# Patient Record
Sex: Female | Born: 1945 | Race: White | Hispanic: No | Smoking: Former smoker
Health system: Southern US, Community
[De-identification: ages and names within clinical notes are randomized; demographics above are authoritative.]

## PROBLEM LIST (undated history)

## (undated) DIAGNOSIS — K859 Acute pancreatitis without necrosis or infection, unspecified: Secondary | ICD-10-CM

## (undated) DIAGNOSIS — K219 Gastro-esophageal reflux disease without esophagitis: Secondary | ICD-10-CM

## (undated) DIAGNOSIS — K8010 Calculus of gallbladder with chronic cholecystitis without obstruction: Secondary | ICD-10-CM

## (undated) DIAGNOSIS — I099 Rheumatic heart disease, unspecified: Secondary | ICD-10-CM

## (undated) DIAGNOSIS — Z923 Personal history of irradiation: Secondary | ICD-10-CM

## (undated) DIAGNOSIS — C801 Malignant (primary) neoplasm, unspecified: Secondary | ICD-10-CM

## (undated) DIAGNOSIS — E785 Hyperlipidemia, unspecified: Secondary | ICD-10-CM

## (undated) DIAGNOSIS — I350 Nonrheumatic aortic (valve) stenosis: Secondary | ICD-10-CM

## (undated) DIAGNOSIS — E669 Obesity, unspecified: Secondary | ICD-10-CM

## (undated) DIAGNOSIS — H269 Unspecified cataract: Secondary | ICD-10-CM

## (undated) DIAGNOSIS — G4730 Sleep apnea, unspecified: Secondary | ICD-10-CM

## (undated) DIAGNOSIS — R195 Other fecal abnormalities: Secondary | ICD-10-CM

## (undated) DIAGNOSIS — C20 Malignant neoplasm of rectum: Secondary | ICD-10-CM

## (undated) DIAGNOSIS — Z006 Encounter for examination for normal comparison and control in clinical research program: Secondary | ICD-10-CM

## (undated) DIAGNOSIS — IMO0002 Reserved for concepts with insufficient information to code with codable children: Secondary | ICD-10-CM

## (undated) DIAGNOSIS — F32A Depression, unspecified: Secondary | ICD-10-CM

## (undated) DIAGNOSIS — J189 Pneumonia, unspecified organism: Secondary | ICD-10-CM

## (undated) DIAGNOSIS — Z860100 Personal history of colon polyps, unspecified: Secondary | ICD-10-CM

## (undated) DIAGNOSIS — E538 Deficiency of other specified B group vitamins: Secondary | ICD-10-CM

## (undated) DIAGNOSIS — Z950 Presence of cardiac pacemaker: Secondary | ICD-10-CM

## (undated) DIAGNOSIS — Z8701 Personal history of pneumonia (recurrent): Secondary | ICD-10-CM

## (undated) DIAGNOSIS — H548 Legal blindness, as defined in USA: Secondary | ICD-10-CM

## (undated) DIAGNOSIS — R569 Unspecified convulsions: Secondary | ICD-10-CM

## (undated) DIAGNOSIS — I872 Venous insufficiency (chronic) (peripheral): Secondary | ICD-10-CM

## (undated) DIAGNOSIS — J449 Chronic obstructive pulmonary disease, unspecified: Secondary | ICD-10-CM

## (undated) DIAGNOSIS — D649 Anemia, unspecified: Secondary | ICD-10-CM

## (undated) DIAGNOSIS — R600 Localized edema: Secondary | ICD-10-CM

## (undated) DIAGNOSIS — N3000 Acute cystitis without hematuria: Secondary | ICD-10-CM

## (undated) DIAGNOSIS — M1990 Unspecified osteoarthritis, unspecified site: Secondary | ICD-10-CM

## (undated) DIAGNOSIS — Z8619 Personal history of other infectious and parasitic diseases: Secondary | ICD-10-CM

## (undated) DIAGNOSIS — Z87442 Personal history of urinary calculi: Secondary | ICD-10-CM

## (undated) DIAGNOSIS — K5900 Constipation, unspecified: Secondary | ICD-10-CM

## (undated) DIAGNOSIS — T07XXXA Unspecified multiple injuries, initial encounter: Secondary | ICD-10-CM

## (undated) DIAGNOSIS — R112 Nausea with vomiting, unspecified: Secondary | ICD-10-CM

## (undated) DIAGNOSIS — N390 Urinary tract infection, site not specified: Secondary | ICD-10-CM

## (undated) DIAGNOSIS — K56600 Partial intestinal obstruction, unspecified as to cause: Secondary | ICD-10-CM

## (undated) DIAGNOSIS — M8580 Other specified disorders of bone density and structure, unspecified site: Secondary | ICD-10-CM

## (undated) DIAGNOSIS — Z9889 Other specified postprocedural states: Secondary | ICD-10-CM

## (undated) DIAGNOSIS — I6381 Other cerebral infarction due to occlusion or stenosis of small artery: Secondary | ICD-10-CM

## (undated) DIAGNOSIS — R011 Cardiac murmur, unspecified: Secondary | ICD-10-CM

## (undated) DIAGNOSIS — R911 Solitary pulmonary nodule: Secondary | ICD-10-CM

## (undated) DIAGNOSIS — Z952 Presence of prosthetic heart valve: Secondary | ICD-10-CM

## (undated) DIAGNOSIS — F5104 Psychophysiologic insomnia: Secondary | ICD-10-CM

## (undated) DIAGNOSIS — I10 Essential (primary) hypertension: Secondary | ICD-10-CM

## (undated) DIAGNOSIS — A419 Sepsis, unspecified organism: Secondary | ICD-10-CM

## (undated) DIAGNOSIS — I5032 Chronic diastolic (congestive) heart failure: Secondary | ICD-10-CM

## (undated) DIAGNOSIS — R0600 Dyspnea, unspecified: Secondary | ICD-10-CM

## (undated) HISTORY — DX: Acute pancreatitis without necrosis or infection, unspecified: K85.90

## (undated) HISTORY — DX: Nausea with vomiting, unspecified: R11.2

## (undated) HISTORY — DX: Psychophysiologic insomnia: F51.04

## (undated) HISTORY — DX: Reserved for concepts with insufficient information to code with codable children: IMO0002

## (undated) HISTORY — DX: Other cerebral infarction due to occlusion or stenosis of small artery: I63.81

## (undated) HISTORY — DX: Legal blindness, as defined in USA: H54.8

## (undated) HISTORY — PX: BACK SURGERY: SHX140

## (undated) HISTORY — DX: Solitary pulmonary nodule: R91.1

## (undated) HISTORY — DX: Nonrheumatic aortic (valve) stenosis: I35.0

## (undated) HISTORY — DX: Personal history of colonic polyps: Z86.010

## (undated) HISTORY — DX: Anemia, unspecified: D64.9

## (undated) HISTORY — DX: Acute cystitis without hematuria: N30.00

## (undated) HISTORY — DX: Urinary tract infection, site not specified: N39.0

## (undated) HISTORY — PX: TONSILLECTOMY: SUR1361

## (undated) HISTORY — DX: Personal history of other infectious and parasitic diseases: Z86.19

## (undated) HISTORY — DX: Hyperlipidemia, unspecified: E78.5

## (undated) HISTORY — DX: Chronic obstructive pulmonary disease, unspecified: J44.9

## (undated) HISTORY — DX: Other specified postprocedural states: Z98.890

## (undated) HISTORY — PX: HAND SURGERY: SHX662

## (undated) HISTORY — PX: CARPAL TUNNEL RELEASE: SHX101

## (undated) HISTORY — DX: Malignant (primary) neoplasm, unspecified: C80.1

## (undated) HISTORY — DX: Personal history of irradiation: Z92.3

## (undated) HISTORY — DX: Personal history of pneumonia (recurrent): Z87.01

## (undated) HISTORY — DX: Presence of prosthetic heart valve: Z95.2

## (undated) HISTORY — PX: FOOT SURGERY: SHX648

## (undated) HISTORY — DX: Encounter for examination for normal comparison and control in clinical research program: Z00.6

## (undated) HISTORY — DX: Unspecified osteoarthritis, unspecified site: M19.90

## (undated) HISTORY — DX: Sleep apnea, unspecified: G47.30

## (undated) HISTORY — DX: Other specified disorders of bone density and structure, unspecified site: M85.80

## (undated) HISTORY — DX: Essential (primary) hypertension: I10

## (undated) HISTORY — DX: Chronic diastolic (congestive) heart failure: I50.32

## (undated) HISTORY — DX: Personal history of colon polyps, unspecified: Z86.0100

## (undated) HISTORY — DX: Pneumonia, unspecified organism: J18.9

## (undated) HISTORY — DX: Malignant neoplasm of rectum: C20

## (undated) HISTORY — DX: Sepsis, unspecified organism: A41.9

## (undated) HISTORY — DX: Deficiency of other specified B group vitamins: E53.8

## (undated) HISTORY — DX: Cardiac murmur, unspecified: R01.1

## (undated) HISTORY — DX: Obesity, unspecified: E66.9

---
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
## 1998-01-05 ENCOUNTER — Ambulatory Visit (HOSPITAL_COMMUNITY): Admission: RE | Admit: 1998-01-05 | Discharge: 1998-01-05 | Payer: Self-pay | Admitting: Obstetrics and Gynecology

## 1998-01-22 ENCOUNTER — Encounter: Payer: Self-pay | Admitting: Internal Medicine

## 1998-01-22 LAB — CONVERTED CEMR LAB

## 1999-02-18 ENCOUNTER — Encounter: Payer: Self-pay | Admitting: Obstetrics and Gynecology

## 1999-02-18 ENCOUNTER — Ambulatory Visit (HOSPITAL_COMMUNITY): Admission: RE | Admit: 1999-02-18 | Discharge: 1999-02-18 | Payer: Self-pay | Admitting: Obstetrics and Gynecology

## 1999-10-02 ENCOUNTER — Inpatient Hospital Stay (HOSPITAL_COMMUNITY): Admission: EM | Admit: 1999-10-02 | Discharge: 1999-10-07 | Payer: Self-pay | Admitting: Emergency Medicine

## 1999-10-03 ENCOUNTER — Encounter: Payer: Self-pay | Admitting: *Deleted

## 2000-05-01 ENCOUNTER — Ambulatory Visit (HOSPITAL_COMMUNITY): Admission: RE | Admit: 2000-05-01 | Discharge: 2000-05-01 | Payer: Self-pay | Admitting: Obstetrics and Gynecology

## 2000-05-01 ENCOUNTER — Encounter: Payer: Self-pay | Admitting: Obstetrics and Gynecology

## 2000-09-12 ENCOUNTER — Encounter: Payer: Self-pay | Admitting: *Deleted

## 2000-09-12 ENCOUNTER — Other Ambulatory Visit: Admission: RE | Admit: 2000-09-12 | Discharge: 2000-09-12 | Payer: Self-pay | Admitting: Obstetrics and Gynecology

## 2000-09-12 ENCOUNTER — Encounter: Admission: RE | Admit: 2000-09-12 | Discharge: 2000-09-12 | Payer: Self-pay | Admitting: *Deleted

## 2000-09-13 ENCOUNTER — Ambulatory Visit (HOSPITAL_BASED_OUTPATIENT_CLINIC_OR_DEPARTMENT_OTHER): Admission: RE | Admit: 2000-09-13 | Discharge: 2000-09-13 | Payer: Self-pay | Admitting: *Deleted

## 2000-12-28 ENCOUNTER — Other Ambulatory Visit: Admission: RE | Admit: 2000-12-28 | Discharge: 2000-12-28 | Payer: Self-pay | Admitting: Obstetrics and Gynecology

## 2000-12-28 ENCOUNTER — Encounter (INDEPENDENT_AMBULATORY_CARE_PROVIDER_SITE_OTHER): Payer: Self-pay

## 2001-05-14 ENCOUNTER — Ambulatory Visit (HOSPITAL_COMMUNITY): Admission: RE | Admit: 2001-05-14 | Discharge: 2001-05-14 | Payer: Self-pay | Admitting: Obstetrics and Gynecology

## 2001-05-14 ENCOUNTER — Encounter: Payer: Self-pay | Admitting: Obstetrics and Gynecology

## 2001-09-11 ENCOUNTER — Other Ambulatory Visit: Admission: RE | Admit: 2001-09-11 | Discharge: 2001-09-11 | Payer: Self-pay | Admitting: *Deleted

## 2001-09-12 ENCOUNTER — Ambulatory Visit (HOSPITAL_BASED_OUTPATIENT_CLINIC_OR_DEPARTMENT_OTHER): Admission: RE | Admit: 2001-09-12 | Discharge: 2001-09-12 | Payer: Self-pay | Admitting: *Deleted

## 2001-10-09 ENCOUNTER — Ambulatory Visit (HOSPITAL_BASED_OUTPATIENT_CLINIC_OR_DEPARTMENT_OTHER): Admission: RE | Admit: 2001-10-09 | Discharge: 2001-10-09 | Payer: Self-pay | Admitting: Plastic Surgery

## 2001-10-09 ENCOUNTER — Encounter (INDEPENDENT_AMBULATORY_CARE_PROVIDER_SITE_OTHER): Payer: Self-pay | Admitting: Specialist

## 2002-03-03 ENCOUNTER — Emergency Department (HOSPITAL_COMMUNITY): Admission: EM | Admit: 2002-03-03 | Discharge: 2002-03-03 | Payer: Self-pay | Admitting: Emergency Medicine

## 2002-07-05 ENCOUNTER — Ambulatory Visit (HOSPITAL_COMMUNITY): Admission: RE | Admit: 2002-07-05 | Discharge: 2002-07-05 | Payer: Self-pay | Admitting: Internal Medicine

## 2002-07-05 ENCOUNTER — Encounter: Payer: Self-pay | Admitting: Obstetrics and Gynecology

## 2002-10-07 ENCOUNTER — Other Ambulatory Visit: Admission: RE | Admit: 2002-10-07 | Discharge: 2002-10-07 | Payer: Self-pay | Admitting: Obstetrics and Gynecology

## 2003-07-10 ENCOUNTER — Encounter: Payer: Self-pay | Admitting: Internal Medicine

## 2003-07-10 ENCOUNTER — Observation Stay (HOSPITAL_COMMUNITY): Admission: AD | Admit: 2003-07-10 | Discharge: 2003-07-11 | Payer: Self-pay | Admitting: Internal Medicine

## 2003-07-11 ENCOUNTER — Encounter: Payer: Self-pay | Admitting: Internal Medicine

## 2003-09-25 ENCOUNTER — Ambulatory Visit (HOSPITAL_COMMUNITY): Admission: RE | Admit: 2003-09-25 | Discharge: 2003-09-25 | Payer: Self-pay | Admitting: Obstetrics and Gynecology

## 2003-10-09 ENCOUNTER — Other Ambulatory Visit: Admission: RE | Admit: 2003-10-09 | Discharge: 2003-10-09 | Payer: Self-pay | Admitting: Obstetrics and Gynecology

## 2004-03-04 ENCOUNTER — Encounter: Admission: RE | Admit: 2004-03-04 | Discharge: 2004-03-04 | Payer: Self-pay | Admitting: Internal Medicine

## 2004-10-20 ENCOUNTER — Ambulatory Visit: Payer: Self-pay | Admitting: Internal Medicine

## 2004-10-23 ENCOUNTER — Emergency Department (HOSPITAL_COMMUNITY): Admission: EM | Admit: 2004-10-23 | Discharge: 2004-10-23 | Payer: Self-pay | Admitting: Family Medicine

## 2004-12-03 ENCOUNTER — Ambulatory Visit: Payer: Self-pay | Admitting: Internal Medicine

## 2004-12-11 ENCOUNTER — Emergency Department (HOSPITAL_COMMUNITY): Admission: EM | Admit: 2004-12-11 | Discharge: 2004-12-11 | Payer: Self-pay | Admitting: *Deleted

## 2004-12-12 ENCOUNTER — Ambulatory Visit: Payer: Self-pay | Admitting: Internal Medicine

## 2004-12-12 ENCOUNTER — Inpatient Hospital Stay (HOSPITAL_COMMUNITY): Admission: EM | Admit: 2004-12-12 | Discharge: 2004-12-18 | Payer: Self-pay | Admitting: Emergency Medicine

## 2004-12-15 ENCOUNTER — Ambulatory Visit: Payer: Self-pay | Admitting: Pulmonary Disease

## 2004-12-28 ENCOUNTER — Ambulatory Visit: Payer: Self-pay | Admitting: Internal Medicine

## 2005-01-10 ENCOUNTER — Encounter: Payer: Self-pay | Admitting: Internal Medicine

## 2005-01-10 LAB — CONVERTED CEMR LAB: Pap Smear: NORMAL

## 2005-01-24 ENCOUNTER — Ambulatory Visit: Payer: Self-pay | Admitting: Internal Medicine

## 2005-02-08 ENCOUNTER — Ambulatory Visit (HOSPITAL_BASED_OUTPATIENT_CLINIC_OR_DEPARTMENT_OTHER): Admission: RE | Admit: 2005-02-08 | Discharge: 2005-02-08 | Payer: Self-pay | Admitting: Internal Medicine

## 2005-02-15 ENCOUNTER — Ambulatory Visit: Payer: Self-pay | Admitting: Pulmonary Disease

## 2005-02-23 ENCOUNTER — Emergency Department (HOSPITAL_COMMUNITY): Admission: EM | Admit: 2005-02-23 | Discharge: 2005-02-23 | Payer: Self-pay | Admitting: Family Medicine

## 2005-02-24 ENCOUNTER — Ambulatory Visit: Payer: Self-pay | Admitting: Internal Medicine

## 2005-03-01 ENCOUNTER — Ambulatory Visit: Payer: Self-pay | Admitting: Internal Medicine

## 2005-03-19 ENCOUNTER — Ambulatory Visit (HOSPITAL_COMMUNITY): Admission: RE | Admit: 2005-03-19 | Discharge: 2005-03-19 | Payer: Self-pay | Admitting: Internal Medicine

## 2005-04-21 ENCOUNTER — Ambulatory Visit: Payer: Self-pay | Admitting: Pulmonary Disease

## 2005-05-17 ENCOUNTER — Ambulatory Visit: Payer: Self-pay | Admitting: Internal Medicine

## 2005-05-19 ENCOUNTER — Inpatient Hospital Stay (HOSPITAL_COMMUNITY): Admission: AD | Admit: 2005-05-19 | Discharge: 2005-05-24 | Payer: Self-pay | Admitting: Internal Medicine

## 2005-05-19 ENCOUNTER — Ambulatory Visit: Payer: Self-pay | Admitting: Internal Medicine

## 2005-06-02 ENCOUNTER — Ambulatory Visit: Payer: Self-pay | Admitting: Internal Medicine

## 2005-06-16 ENCOUNTER — Ambulatory Visit: Payer: Self-pay | Admitting: Internal Medicine

## 2005-06-18 ENCOUNTER — Encounter: Admission: RE | Admit: 2005-06-18 | Discharge: 2005-06-18 | Payer: Self-pay | Admitting: Internal Medicine

## 2005-07-26 ENCOUNTER — Inpatient Hospital Stay (HOSPITAL_COMMUNITY): Admission: RE | Admit: 2005-07-26 | Discharge: 2005-07-27 | Payer: Self-pay | Admitting: Neurological Surgery

## 2005-11-04 ENCOUNTER — Ambulatory Visit (HOSPITAL_COMMUNITY): Admission: RE | Admit: 2005-11-04 | Discharge: 2005-11-04 | Payer: Self-pay | Admitting: Neurological Surgery

## 2006-01-09 ENCOUNTER — Inpatient Hospital Stay (HOSPITAL_COMMUNITY): Admission: RE | Admit: 2006-01-09 | Discharge: 2006-01-13 | Payer: Self-pay | Admitting: Neurological Surgery

## 2006-07-21 ENCOUNTER — Ambulatory Visit (HOSPITAL_COMMUNITY): Admission: RE | Admit: 2006-07-21 | Discharge: 2006-07-21 | Payer: Self-pay | Admitting: Obstetrics & Gynecology

## 2006-08-11 ENCOUNTER — Ambulatory Visit: Payer: Self-pay | Admitting: Pulmonary Disease

## 2006-09-05 ENCOUNTER — Ambulatory Visit: Payer: Self-pay | Admitting: Internal Medicine

## 2006-09-22 ENCOUNTER — Ambulatory Visit: Payer: Self-pay | Admitting: Pulmonary Disease

## 2006-12-20 ENCOUNTER — Ambulatory Visit: Payer: Self-pay | Admitting: Internal Medicine

## 2006-12-26 ENCOUNTER — Ambulatory Visit: Payer: Self-pay | Admitting: Internal Medicine

## 2006-12-28 ENCOUNTER — Ambulatory Visit: Payer: Self-pay | Admitting: Internal Medicine

## 2007-01-04 ENCOUNTER — Ambulatory Visit: Payer: Self-pay | Admitting: Internal Medicine

## 2007-05-03 ENCOUNTER — Encounter: Payer: Self-pay | Admitting: Internal Medicine

## 2007-05-03 DIAGNOSIS — E785 Hyperlipidemia, unspecified: Secondary | ICD-10-CM

## 2007-05-03 DIAGNOSIS — J449 Chronic obstructive pulmonary disease, unspecified: Secondary | ICD-10-CM | POA: Insufficient documentation

## 2007-05-03 HISTORY — DX: Hyperlipidemia, unspecified: E78.5

## 2007-08-06 DIAGNOSIS — J984 Other disorders of lung: Secondary | ICD-10-CM | POA: Insufficient documentation

## 2007-08-06 DIAGNOSIS — K219 Gastro-esophageal reflux disease without esophagitis: Secondary | ICD-10-CM | POA: Insufficient documentation

## 2007-08-08 ENCOUNTER — Ambulatory Visit: Payer: Self-pay | Admitting: Internal Medicine

## 2007-08-08 ENCOUNTER — Encounter: Payer: Self-pay | Admitting: Internal Medicine

## 2007-08-28 ENCOUNTER — Telehealth: Payer: Self-pay | Admitting: Internal Medicine

## 2007-12-13 ENCOUNTER — Ambulatory Visit: Payer: Self-pay | Admitting: Internal Medicine

## 2007-12-13 DIAGNOSIS — G5710 Meralgia paresthetica, unspecified lower limb: Secondary | ICD-10-CM

## 2007-12-13 HISTORY — DX: Meralgia paresthetica, unspecified lower limb: G57.10

## 2007-12-19 ENCOUNTER — Ambulatory Visit (HOSPITAL_COMMUNITY): Admission: RE | Admit: 2007-12-19 | Discharge: 2007-12-19 | Payer: Self-pay | Admitting: Internal Medicine

## 2007-12-31 ENCOUNTER — Inpatient Hospital Stay (HOSPITAL_COMMUNITY): Admission: AD | Admit: 2007-12-31 | Discharge: 2008-01-05 | Payer: Self-pay | Admitting: Internal Medicine

## 2007-12-31 ENCOUNTER — Ambulatory Visit: Payer: Self-pay | Admitting: Internal Medicine

## 2008-01-03 ENCOUNTER — Telehealth: Payer: Self-pay | Admitting: Internal Medicine

## 2008-01-10 ENCOUNTER — Ambulatory Visit: Payer: Self-pay | Admitting: Internal Medicine

## 2008-01-10 DIAGNOSIS — J189 Pneumonia, unspecified organism: Secondary | ICD-10-CM | POA: Insufficient documentation

## 2008-01-26 ENCOUNTER — Ambulatory Visit: Payer: Self-pay | Admitting: Family Medicine

## 2008-01-26 DIAGNOSIS — Z8709 Personal history of other diseases of the respiratory system: Secondary | ICD-10-CM | POA: Insufficient documentation

## 2008-01-26 DIAGNOSIS — J209 Acute bronchitis, unspecified: Secondary | ICD-10-CM | POA: Insufficient documentation

## 2008-02-04 ENCOUNTER — Telehealth: Payer: Self-pay | Admitting: Internal Medicine

## 2008-02-04 ENCOUNTER — Ambulatory Visit: Payer: Self-pay | Admitting: Cardiovascular Disease

## 2008-02-04 ENCOUNTER — Ambulatory Visit: Payer: Self-pay | Admitting: Internal Medicine

## 2008-02-04 ENCOUNTER — Inpatient Hospital Stay (HOSPITAL_COMMUNITY): Admission: AD | Admit: 2008-02-04 | Discharge: 2008-02-08 | Payer: Self-pay | Admitting: Internal Medicine

## 2008-02-06 ENCOUNTER — Encounter: Payer: Self-pay | Admitting: Internal Medicine

## 2008-02-06 ENCOUNTER — Ambulatory Visit: Payer: Self-pay | Admitting: Vascular Surgery

## 2008-02-08 ENCOUNTER — Encounter: Payer: Self-pay | Admitting: Internal Medicine

## 2008-02-14 ENCOUNTER — Ambulatory Visit: Payer: Self-pay | Admitting: Internal Medicine

## 2008-02-15 ENCOUNTER — Telehealth: Payer: Self-pay | Admitting: Internal Medicine

## 2008-02-28 ENCOUNTER — Ambulatory Visit: Payer: Self-pay | Admitting: Internal Medicine

## 2008-02-28 DIAGNOSIS — J129 Viral pneumonia, unspecified: Secondary | ICD-10-CM | POA: Insufficient documentation

## 2008-03-03 ENCOUNTER — Encounter: Payer: Self-pay | Admitting: Internal Medicine

## 2008-03-05 ENCOUNTER — Ambulatory Visit: Payer: Self-pay | Admitting: Internal Medicine

## 2008-03-05 DIAGNOSIS — E663 Overweight: Secondary | ICD-10-CM | POA: Insufficient documentation

## 2008-03-05 DIAGNOSIS — G8929 Other chronic pain: Secondary | ICD-10-CM | POA: Insufficient documentation

## 2008-03-05 DIAGNOSIS — Z6823 Body mass index (BMI) 23.0-23.9, adult: Secondary | ICD-10-CM | POA: Insufficient documentation

## 2008-03-05 DIAGNOSIS — M545 Low back pain, unspecified: Secondary | ICD-10-CM

## 2008-03-05 DIAGNOSIS — B3789 Other sites of candidiasis: Secondary | ICD-10-CM | POA: Insufficient documentation

## 2008-03-05 DIAGNOSIS — Z6824 Body mass index (BMI) 24.0-24.9, adult: Secondary | ICD-10-CM | POA: Insufficient documentation

## 2008-03-05 DIAGNOSIS — Z6825 Body mass index (BMI) 25.0-25.9, adult: Secondary | ICD-10-CM

## 2008-03-05 HISTORY — DX: Other chronic pain: G89.29

## 2008-03-05 HISTORY — DX: Low back pain, unspecified: M54.50

## 2008-04-08 ENCOUNTER — Ambulatory Visit: Payer: Self-pay | Admitting: Cardiology

## 2008-04-09 ENCOUNTER — Encounter (HOSPITAL_COMMUNITY): Admission: RE | Admit: 2008-04-09 | Discharge: 2008-07-08 | Payer: Self-pay | Admitting: Internal Medicine

## 2008-04-21 ENCOUNTER — Ambulatory Visit: Payer: Self-pay | Admitting: Internal Medicine

## 2008-04-21 DIAGNOSIS — R011 Cardiac murmur, unspecified: Secondary | ICD-10-CM | POA: Insufficient documentation

## 2008-04-24 ENCOUNTER — Ambulatory Visit: Payer: Self-pay | Admitting: Internal Medicine

## 2008-04-24 DIAGNOSIS — L8990 Pressure ulcer of unspecified site, unspecified stage: Secondary | ICD-10-CM | POA: Insufficient documentation

## 2008-06-23 ENCOUNTER — Telehealth: Payer: Self-pay | Admitting: Internal Medicine

## 2008-07-09 ENCOUNTER — Encounter (HOSPITAL_COMMUNITY): Admission: RE | Admit: 2008-07-09 | Discharge: 2008-09-08 | Payer: Self-pay | Admitting: Internal Medicine

## 2008-07-16 ENCOUNTER — Ambulatory Visit: Payer: Self-pay | Admitting: Internal Medicine

## 2008-07-22 ENCOUNTER — Ambulatory Visit: Payer: Self-pay | Admitting: Internal Medicine

## 2008-07-24 ENCOUNTER — Encounter: Payer: Self-pay | Admitting: Internal Medicine

## 2008-07-24 ENCOUNTER — Ambulatory Visit: Payer: Self-pay

## 2008-07-27 ENCOUNTER — Encounter: Payer: Self-pay | Admitting: Internal Medicine

## 2008-08-05 ENCOUNTER — Telehealth: Payer: Self-pay | Admitting: Internal Medicine

## 2008-08-05 ENCOUNTER — Ambulatory Visit: Payer: Self-pay | Admitting: Internal Medicine

## 2008-08-14 ENCOUNTER — Encounter: Payer: Self-pay | Admitting: Internal Medicine

## 2008-09-09 ENCOUNTER — Encounter (HOSPITAL_COMMUNITY): Admission: RE | Admit: 2008-09-09 | Discharge: 2008-10-09 | Payer: Self-pay | Admitting: Internal Medicine

## 2008-09-17 ENCOUNTER — Ambulatory Visit: Payer: Self-pay | Admitting: Internal Medicine

## 2008-10-30 ENCOUNTER — Encounter: Payer: Self-pay | Admitting: Internal Medicine

## 2008-10-30 ENCOUNTER — Telehealth: Payer: Self-pay | Admitting: Internal Medicine

## 2008-12-08 ENCOUNTER — Ambulatory Visit: Payer: Self-pay | Admitting: Internal Medicine

## 2008-12-23 ENCOUNTER — Ambulatory Visit (HOSPITAL_COMMUNITY): Admission: RE | Admit: 2008-12-23 | Discharge: 2008-12-23 | Payer: Self-pay | Admitting: Orthopedic Surgery

## 2009-01-26 ENCOUNTER — Encounter: Payer: Self-pay | Admitting: Internal Medicine

## 2009-01-26 ENCOUNTER — Telehealth: Payer: Self-pay | Admitting: Internal Medicine

## 2009-02-10 ENCOUNTER — Encounter (HOSPITAL_COMMUNITY): Admission: RE | Admit: 2009-02-10 | Discharge: 2009-05-11 | Payer: Self-pay | Admitting: Internal Medicine

## 2009-02-25 ENCOUNTER — Ambulatory Visit: Payer: Self-pay | Admitting: Internal Medicine

## 2009-02-25 DIAGNOSIS — L259 Unspecified contact dermatitis, unspecified cause: Secondary | ICD-10-CM | POA: Insufficient documentation

## 2009-03-16 ENCOUNTER — Encounter: Payer: Self-pay | Admitting: Internal Medicine

## 2009-03-17 ENCOUNTER — Telehealth: Payer: Self-pay | Admitting: Internal Medicine

## 2009-03-19 ENCOUNTER — Telehealth (INDEPENDENT_AMBULATORY_CARE_PROVIDER_SITE_OTHER): Payer: Self-pay | Admitting: *Deleted

## 2009-03-24 ENCOUNTER — Ambulatory Visit: Payer: Self-pay | Admitting: Internal Medicine

## 2009-03-24 DIAGNOSIS — I10 Essential (primary) hypertension: Secondary | ICD-10-CM | POA: Insufficient documentation

## 2009-03-24 HISTORY — DX: Essential (primary) hypertension: I10

## 2009-05-12 ENCOUNTER — Encounter (HOSPITAL_COMMUNITY): Admission: RE | Admit: 2009-05-12 | Discharge: 2009-08-10 | Payer: Self-pay | Admitting: Internal Medicine

## 2009-05-25 ENCOUNTER — Ambulatory Visit: Payer: Self-pay | Admitting: Internal Medicine

## 2009-06-03 ENCOUNTER — Ambulatory Visit: Payer: Self-pay | Admitting: Internal Medicine

## 2009-06-03 DIAGNOSIS — J441 Chronic obstructive pulmonary disease with (acute) exacerbation: Secondary | ICD-10-CM | POA: Insufficient documentation

## 2009-06-05 ENCOUNTER — Telehealth: Payer: Self-pay | Admitting: Internal Medicine

## 2009-07-06 ENCOUNTER — Telehealth (INDEPENDENT_AMBULATORY_CARE_PROVIDER_SITE_OTHER): Payer: Self-pay | Admitting: *Deleted

## 2009-07-07 ENCOUNTER — Ambulatory Visit: Payer: Self-pay | Admitting: Internal Medicine

## 2009-07-23 ENCOUNTER — Telehealth: Payer: Self-pay | Admitting: Internal Medicine

## 2009-08-11 ENCOUNTER — Encounter (HOSPITAL_COMMUNITY): Admission: RE | Admit: 2009-08-11 | Discharge: 2009-11-09 | Payer: Self-pay | Admitting: Internal Medicine

## 2009-09-15 ENCOUNTER — Ambulatory Visit: Payer: Self-pay | Admitting: Internal Medicine

## 2009-09-15 DIAGNOSIS — R05 Cough: Secondary | ICD-10-CM

## 2009-09-15 DIAGNOSIS — R059 Cough, unspecified: Secondary | ICD-10-CM | POA: Insufficient documentation

## 2009-11-10 ENCOUNTER — Encounter (HOSPITAL_COMMUNITY): Admission: RE | Admit: 2009-11-10 | Discharge: 2010-02-08 | Payer: Self-pay | Admitting: Internal Medicine

## 2009-11-19 ENCOUNTER — Ambulatory Visit: Payer: Self-pay | Admitting: Internal Medicine

## 2009-12-24 ENCOUNTER — Ambulatory Visit: Payer: Self-pay | Admitting: Internal Medicine

## 2009-12-24 ENCOUNTER — Ambulatory Visit (HOSPITAL_COMMUNITY): Admission: RE | Admit: 2009-12-24 | Discharge: 2009-12-24 | Payer: Self-pay | Admitting: Internal Medicine

## 2010-01-22 ENCOUNTER — Telehealth: Payer: Self-pay | Admitting: Internal Medicine

## 2010-01-25 ENCOUNTER — Telehealth (INDEPENDENT_AMBULATORY_CARE_PROVIDER_SITE_OTHER): Payer: Self-pay | Admitting: *Deleted

## 2010-02-01 ENCOUNTER — Ambulatory Visit: Payer: Self-pay | Admitting: Internal Medicine

## 2010-02-09 ENCOUNTER — Encounter (HOSPITAL_COMMUNITY): Admission: RE | Admit: 2010-02-09 | Discharge: 2010-03-11 | Payer: Self-pay | Admitting: Internal Medicine

## 2010-02-15 ENCOUNTER — Ambulatory Visit (HOSPITAL_COMMUNITY): Admission: RE | Admit: 2010-02-15 | Discharge: 2010-02-15 | Payer: Self-pay | Admitting: Internal Medicine

## 2010-02-15 ENCOUNTER — Ambulatory Visit: Payer: Self-pay | Admitting: Internal Medicine

## 2010-02-15 ENCOUNTER — Ambulatory Visit: Payer: Self-pay

## 2010-02-15 ENCOUNTER — Encounter: Payer: Self-pay | Admitting: Internal Medicine

## 2010-02-18 ENCOUNTER — Encounter: Payer: Self-pay | Admitting: Internal Medicine

## 2010-02-23 ENCOUNTER — Ambulatory Visit: Payer: Self-pay | Admitting: Internal Medicine

## 2010-02-23 ENCOUNTER — Telehealth: Payer: Self-pay | Admitting: Internal Medicine

## 2010-04-07 ENCOUNTER — Ambulatory Visit: Payer: Self-pay | Admitting: Internal Medicine

## 2010-04-20 ENCOUNTER — Encounter: Payer: Self-pay | Admitting: Internal Medicine

## 2010-10-31 ENCOUNTER — Encounter: Payer: Self-pay | Admitting: Internal Medicine

## 2010-11-09 NOTE — Assessment & Plan Note (Signed)
Summary: LEG PAIN/NML  Medications Added VITAMIN E 400 UNIT  CAPS (VITAMIN E) 2 TABS ONCE DAILY        Vital Signs:  Patient Profile:   65 Years Old Female Height:     66 inches Weight:      287 pounds Temp:     97.9 degrees F oral Pulse rate:   71 / minute BP sitting:   140 / 71  (left arm) Cuff size:   large  Vitals Entered By: M.WILKIE/SMA                 PCP:  Damisha Wolff  Chief Complaint:  Left side hip/thigh area pain.  History of Present Illness: pain in left lefg burning left lateral thigh.    Updated Prior Medication List: VITAMIN E 400 UNIT  CAPS (VITAMIN E) 2 TABS ONCE DAILY CALCIUM 600 MG  TABS (CALCIUM) 1 once daily EFFEXOR 75 MG  TABS (VENLAFAXINE HCL) 1 once daily BENICAR HCT 40-12.5 MG  TABS (OLMESARTAN MEDOXOMIL-HCTZ) 1 once daily ASPIRIN 325 MG  TABS (ASPIRIN) 1 once daily` LOVASTATIN 40 MG  TABS (LOVASTATIN) 1 once daily SPIRIVA HANDIHALER 18 MCG  CAPS (TIOTROPIUM BROMIDE MONOHYDRATE) once daily SINGULAIR 10 MG  TABS (MONTELUKAST SODIUM) 1 once daily ALBUTEROL SULFATE (2.5 MG/3ML) 0.083%  NEBU (ALBUTEROL SULFATE) QID as needed ALBUTEROL 90 MCG/ACT  AERS (ALBUTEROL) as needed ADVAIR DISKUS 250-50 MCG/DOSE MISC (FLUTICASONE-SALMETEROL) inhale 1 dose by mouth every 12 hours AUG BETAMETHASONE DIPROPIONATE 0.05 % CREA (AUG BETAMETHASONE DIPROPIONATE)  NYSTATIN 100000 UNIT/GM  POWD (NYSTATIN) apply to area of rash two times a day  Current Allergies: SULFA CODEINE PREDNISONE  Past Medical History:    Reviewed history from 08/08/2007 and no changes required:       IBS       Asthma       COPD       Hyperlipidemia       Allergic rhinitis  Past Surgical History:    Reviewed history from 08/08/2007 and no changes required:       Tonsillectomy       Insertion of "X-stop" L4-5  Oct '06  Elsner       Lumbar laminectomy-with fusion  L4-5 April '07  Elsner       Carpal tunnel release-bilateral left Dec '04, right Dec '05   Meyerdierks  bunionectomy right foot              G2P2 NSVD   Family History:    Reviewed history from 08/08/2007 and no changes required:       mother -died 02-09-23  CAD, HTN, DM       fathr-died at 78 lung cancer       neg for  breast, colon cancer  Social History:    Reviewed history from 08/08/2007 and no changes required:       married 1966       2 sons, 1 grandchild       retired-[employer]       doing OK       March '09 - financially tough times    Review of Systems  The patient denies anorexia, fever, weight loss, weight gain, vision loss, decreased hearing, hoarseness, chest pain, syncope, dyspnea on exhertion, peripheral edema, prolonged cough, hemoptysis, abdominal pain, melena, hematochezia, severe indigestion/heartburn, hematuria, incontinence, genital sores, muscle weakness, suspicious skin lesions, transient blindness, difficulty walking, depression, unusual weight change, abnormal bleeding, enlarged lymph nodes, angioedema, and breast masses.  Physical Exam  General:     alert, well-developed, well-nourished, well-hydrated, normal appearance, and overweight-appearing.   Neurologic:     area of paresthesia and pain in a band at the lateral left thigh.    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 Has been painful since doing increased lifting and using stationary bike.  Plan: cpt from [employer] provided, NSAID, no tight clothes and stretching, stay off stationary bike until pain better.If not better - nerve block.  Complete Medication List: 1)  Vitamin E 400 Unit Caps (Vitamin e) .... 2 tabs once daily 2)  Calcium 600 Mg Tabs (Calcium) .Marland Kitchen.. 1 once daily 3)  Effexor 75 Mg Tabs (Venlafaxine hcl) .Marland Kitchen.. 1 once daily 4)  Benicar Hct 40-12.5 Mg Tabs (Olmesartan medoxomil-hctz) .Marland Kitchen.. 1 once daily 5)  Aspirin 325 Mg Tabs (Aspirin) .Marland Kitchen.. 1 once daily` 6)  Lovastatin 40  Mg Tabs (Lovastatin) .Marland Kitchen.. 1 once daily 7)  Spiriva Handihaler 18 Mcg Caps (Tiotropium bromide monohydrate) .... Once daily 8)  Singulair 10 Mg Tabs (Montelukast sodium) .Marland Kitchen.. 1 once daily 9)  Albuterol Sulfate (2.5 Mg/44ml) 0.083% Nebu (Albuterol sulfate) .... Qid as needed 10)  Albuterol 90 Mcg/act Aers (Albuterol) .... As needed 11)  Advair Diskus 250-50 Mcg/dose Misc (Fluticasone-salmeterol) .... Inhale 1 dose by mouth every 12 hours 12)  Aug Betamethasone Dipropionate 0.05 % Crea (Aug betamethasone dipropionate) 13)  Nystatin 100000 Unit/gm Powd (Nystatin) .... Apply to area of rash two times a day     ]

## 2010-11-09 NOTE — Progress Notes (Signed)
Summary: APT TODAY??  Phone Note Call from Patient   Summary of Call: Patient is requesting work in apt today, has an apt tomorrow. C/o "bronchitis", nonproductive cough, sore throat, chills and unsure of fever which all began yesterday. She is req to be worked in today b/c she is worried that she will have to be hospitalized if she waits too long.  Initial call taken by: Lamar Sprinkles,  August 05, 2008 8:55 AM  Follow-up for Phone Call        OK to work in this PM Follow-up by: Jacques Navy MD,  August 05, 2008 10:33 AM  Additional Follow-up for Phone Call Additional follow up Details #1::        Pt informed coming in at 4:30, aware of wait Additional Follow-up by: Lamar Sprinkles,  August 05, 2008 11:38 AM

## 2010-11-09 NOTE — Assessment & Plan Note (Signed)
Summary: 6 months/apc   Visit Type:  Follow-up Primary Provider/Referring Provider:  Norins  CC:  [name]. Pt states she ahs just finished a round of abx given by Dr. Debby Bud. Pt also states she had bronchitis in June. She states now she is having some SOB, dry cough, and and nasal congestion.[name]  History of Present Illness: Hospital followup 02/04/2008 - 02/08/2008 after 2nd hospitaliation for pneumonia/ae-copd.[name] She was sent home on o2, spriiva, advair, and albuterol prn and prolonged solumedrol taper (all to prednisone). In Interim, followup had significant fatigure and deconditioning related dyspnea. Currently feels . No acute symptoms. No other new symptoms. She is now volunteering at [facility] [address].  Started rehab 3 days ago. OVeral dyspnea improved but still has some mild doe.   July 13., 2009 OV: ffollowup CT chest from 04/08/2008; shows clearance of pna from april 2009. REC: continue singular, advair, and spiriva for asthma/copd. Return wtih full pfts  OV 07/16/2008: Feels well. Lost 20# intentially. Attending pulmonary rehab. Unclear if dyspnea better; can do 15 minutes on treadmill.  She does feel stronger overall. No proiblems in interim. Has had flu shot. Wants to know if swine flu shot is available; not yet. PTs - fev1 1.8L/72%, Ratio 67 (73),  TLC 5L/91%, DLCO 20/71%. No BD response.   OV 12/08/2008: Feels well. In interim, had left foot surgery for hammer toes. tHis made her go off pulmonary rehab which she will recommence back in April. SHe is compliant with advair, singulair and spiriva. She has not needed her albuterol in many months. Denies dyspnea, wheeze, pnd, orthopnea, cough, hemoptysis, fever, chills, nausea, vomitting, diarrhea, edema. She is scared of the spring because that is when she gets her exacerbations typically and she does not want to taper her current medicatin regimenov 06/03/2009. fOLLOWUP COPD. Doing well since last visit in March 2010. BUt,  in June 2010 did 14h car ride with q2h breaks to [place]. AFter that developed viral illness but recovered. Now, past 10 days similar viral illness. Denies sick contacts. c/o sneezing, nasal congestion, dyspnea, wheeze but no fever. Finished 7d augmentin 2 days ago (given by Dr. Debby Bud). Better now but feels she still is tight and congested. Plans to go to [place] in October and wants to know if she should take her flu shot now.   Current Medications (verified): 1)  Calcium 600 Mg  Tabs (Calcium) .... 2 Once Daily 2)  Aspirin 325 Mg  Tabs (Aspirin) .[name].. 1 Once Daily` 3)  Lovastatin 40 Mg  Tabs (Lovastatin) .[name].. 1 Once Daily 4)  Albuterol 90 Mcg/act  Aers (Albuterol) .... As Needed 5)  Albuterol Sulfate (2.5 Mg/23ml) 0.083%  Nebu (Albuterol Sulfate) .... Nebulized Qid As Needed 6)  Betamethasone Dipropionate 0.05 % Crea (Betamethasone Dipropionate) .... Apply Two Times A Day As Needed 7)  Spiriva Handihaler 18 Mcg Caps (Tiotropium Bromide Monohydrate) .... Inhale Contents of 1 Capsule Using Handihaler Daily. 8)  Advair Diskus 100-50 Mcg/dose Misc (Fluticasone-Salmeterol) .[name].. 1 Puff Two Times A Day 9)  Singulair 10 Mg Tabs (Montelukast Sodium) .[name].. 1 By Mouth Daily 10)  Diflucan 100 Mg Tabs (Fluconazole) .... Take 1 Tablet By Mouth Once A Day X 10days As Needed 11)  Sudafed 12 Hour 120 Mg Xr12h-Tab (Pseudoephedrine Hcl) .... Per Bottle  Allergies (verified): 1)  Sulfa 2)  Codeine 3)  Prednisone  Past History:  Family History: Last updated: 27-Aug-2007 mother -died 02/03/23  CAD, HTN, DM fathr-died at 73 lung  cancer neg for  breast, colon cancer  Social History: Last updated: 12/13/2007 married 1966 2 sons, 1 grandchild retired-[employer] doing OK March '09 - financially tough times  Risk Factors: Caffeine Use: 4 (08/08/2007) Exercise: no (08/08/2007)  Risk Factors: Smoking Status: quit (05/03/2007) Passive Smoke Exposure: no (08/08/2007)  Past Medical History: Reviewed history  from 07/16/2008 and no changes required. IBS Hyperlipidemia Allergic rhinitis Pneumonia, hx of intertriginous yeast infection (breasts) COPD/ASthma March  2008, She had an FEV-1 of 60% with FEV-1: FVC ratio  rreduced at 51 and a normal     diffusion capacity back in 2007.  Dr.   Sung Amabile, who was her pulmonary care physician, and was treating         her COPD with asthma with Spiriva/Advair 250/50 and Singulair 10 mg.  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 Valve was never visualized Heart murmur- Aortic Hammer toe deformities Pneumovax - July 2009 RLL nodule - 6mm stable 2006, april 2009 and June 2009. No further followup  Past Surgical History: Reviewed history from 08/08/2007 and no changes required. Tonsillectomy Insertion of "X-stop" L4-5  Oct '06  Elsner Lumbar laminectomy-with fusion  L4-5 April '07  Elsner Carpal tunnel release-bilateral left Dec '04, right Dec '05   Meyerdierks bunionectomy right foot  G2P2 NSVD  Family History: Reviewed history from 08/08/2007 and no changes required. mother -died Jan 21, 2023  CAD, HTN, DM fathr-died at 39 lung cancer neg for  breast, colon cancer  Social History: Reviewed history from 12/13/2007 and no changes required. married 1966 2 sons, 1 grandchild retired-[employer] doing OK March '09 - financially tough times  Review of Systems       The patient complains of shortness of breath with activity, non-productive cough, sore throat, and nasal congestion/difficulty breathing through nose.  The patient denies shortness of breath at rest, productive cough, coughing up blood, chest pain, irregular heartbeats, acid heartburn, indigestion, loss of appetite, weight change, abdominal pain, difficulty swallowing, tooth/dental problems, headaches, sneezing, itching, ear ache, anxiety, depression, hand/feet  swelling, joint stiffness or pain, rash, change in color of mucus, and fever.    Vital Signs:  Patient profile:   65 year old female Height:      68 inches Weight:      241.50 pounds O2 Sat:      95 % on Room air Temp:     97.8 degrees F oral Pulse rate:   75 / minute BP sitting:   150 / 90  (left arm) Cuff size:   regular  Vitals Entered By: Carron Curie CMA (June 03, 2009 9:54 AM)  O2 Flow:  Room air CC: [name]. Pt states she ahs just finished a round of abx given by Dr. Debby Bud. Pt also states she had bronchitis in June. She states now she is having some SOB, dry cough, and nasal congestion. Comments Medications reviewed with patient Carron Curie CMA  June 03, 2009 10:00 AM    Physical Exam  General:  Obese white female in no distess Head:  no tenderness to percussion over the frontal or maxillary sinuses Eyes:  C&S clear Ears:  R ear normal and L ear normal.   Nose:  no external deformity, no nasal discharge, and no mucosal edema.   Mouth:  no gingival abnormalities.   Neck:  supple and full  ROM.   Chest Wall:  no deformities noted Lungs:  decreased BS bilateral, wheezes bilateral, and prolonged exhilation.  Speaks full sentences. No acc muscle use. No distress. No cyanosis.  Heart:  normal rate and regular rhythm.   Abdomen:  bowel sounds positive; abdomen soft and non-tender without masses, or organomegaly Msk:  no deformity or scoliosis noted with normal posture Pulses:  pulses normal Extremities:  no edema, no ulcers  Neurologic:  CN II-XII grossly intact with normal reflexes, coordination, muscle strength and tone Skin:  turgor normal, color normal, no rashes, and no suspicious lesions.   Cervical Nodes:  no significant adenopathy Axillary Nodes:  no significant adenopathy Psych:  Oriented X3, memory intact for recent and remote, and normally interactive.     Impression & Recommendations:  Problem # 1:  CHRONIC OBSTRUCTIVE  PULMONARY DISEASE, ACUTE EXACERBATION (ICD-491.21) Assessment New  She is in mild-moderate exacerbation. Has not full responded to augmentin Rx. Needs steroids  PLAN Albuterol neb in office stat STeroid burst - she prefers methylprednisolone (8 day taper written out) hold off flu shot till better call if worse  Orders: Est. Patient Level III (09811)  Medications Added to Medication List This Visit: 1)  Sudafed 12 Hour 120 Mg Xr12h-tab (Pseudoephedrine hcl) .... Per bottle  Patient Instructions: 1)  have medrol dose pak as directed 2)  have flu shot in a few weeks from now, once you are recoverd from your current illness 3)  call us in few days to tell us how you are doing 4)  come sooner or call if worse 5)  otherwise return in 3months

## 2010-11-09 NOTE — Assessment & Plan Note (Signed)
Summary: rov///kp   Visit Type:  Follow-up PCP:  Norins  Chief Complaint:  3 month asthma f/u. Pt says she is doing well on Advair 100/50 and Spiriva and Singulair.[name]  History of Present Illness: Hospital followup 02/04/2008 - 02/08/2008 after 2nd hospitaliation for pneumonia/ae-copd.[name] She was sent home on o2, spriiva, advair, and albuterol prn and prolonged solumedrol taper (all to prednisone). In Interim, followup had significant fatigure and deconditioning related dyspnea. Currently feels . No acute symptoms. No other new symptoms. She is now volunteering at [facility] [address].  Started rehab 3 days ago. OVeral dyspnea improved but still has some mild doe.   July 13., 2009 OV: ffollowup CT chest from 04/08/2008; shows clearance of pna from april 2009. REC: continue singular, advair, and spiriva for asthma/copd. Return wtih full pfts  OV 07/16/2008: Feels well. Lost 20# intentially. Attending pulmonary rehab. Unclear if dyspnea better; can do 15 minutes on treadmill.  She does feel stronger overall. No proiblems in interim. Has had flu shot. Wants to know if swine flu shot is available; not yet. PTs - fev1 1.8L/72%, Ratio 67 (73),  TLC 5L/91%, DLCO 20/71%. No BD response.   OV 12/08/2008: Feels well. In interim, had left foot surgery for hammer toes. tHis made her go off pulmonary rehab which she will recommence back in April. SHe is compliant with advair, singulair and spiriva. She has not needed her albuterol in many months. Denies dyspnea, wheeze, pnd, orthopnea, cough, hemoptysis, fever, chills, nausea, vomitting, diarrhea, edema. She is scared of the spring because that is when she gets her exacerbations typically and she does not want to taper her current medicatin regimen    Prior Medications Reviewed Using: Patient Recall  Updated Prior Medication List: CALCIUM 600 MG  TABS (CALCIUM) 2 once daily BENICAR HCT 40-12.5 MG  TABS (OLMESARTAN MEDOXOMIL-HCTZ) 1 once daily ASPIRIN 325 MG  TABS  (ASPIRIN) 1 once daily` LOVASTATIN 40 MG  TABS (LOVASTATIN) 1 once daily ALBUTEROL 90 MCG/ACT  AERS (ALBUTEROL) as needed ALBUTEROL SULFATE (2.5 MG/3ML) 0.083%  NEBU (ALBUTEROL SULFATE) nebulized qid as needed BETAMETHASONE DIPROPIONATE 0.05 % CREA (BETAMETHASONE DIPROPIONATE) apply two times a day as needed SPIRIVA HANDIHALER 18 MCG CAPS (TIOTROPIUM BROMIDE MONOHYDRATE) Inhale contents of 1 capsule using handihaler daily. ADVAIR DISKUS 100-50 MCG/DOSE MISC (FLUTICASONE-SALMETEROL) 1 puff two times a day SINGULAIR 10 MG TABS (MONTELUKAST SODIUM) 1 by mouth daily  Current Allergies (reviewed today): SULFA CODEINE PREDNISONE Past Medical History:    Reviewed history from 07/16/2008 and no changes required:       IBS       Hyperlipidemia       Allergic rhinitis       Pneumonia, hx of       intertriginous yeast infection (breasts)       COPD/ASthma March  2008, She had an FEV-1 of 60% with FEV-1: FVC ratio  rreduced at 6 and a normal     diffusion capacity back in 2007.  Dr.   Sung Amabile, who was her pulmonary care physician, and was treating         her COPD with asthma with Spiriva/Advair 250/50 and Singulair 10 mg.  October 2009: PFTs - fev1 1.8L/72%, Ratio 67 (73),  TLC 5L/91%, DLCO 20/71%       15 pack smoking history - quit 1976       Ejection systolic murmur - echo 01/2008 shows lvh and likely gradient. Valve was never visualized       Heart murmur-  Aortic       Hammer toe deformities       Pneumovax - July 2009       RLL nodule - 6mm stable 2006, april 2009 and June 2009. No further followup  Past Surgical History:    Reviewed history from 08/08/2007 and no changes required:       Tonsillectomy       Insertion of "X-stop" L4-5  Oct '06  Elsner       Lumbar laminectomy-with fusion  L4-5 April '07  Elsner       Carpal tunnel release-bilateral left Dec '04, right Dec '05   Meyerdierks       bunionectomy right foot              G2P2 NSVD  Family History:    Reviewed history from  08/08/2007 and no changes required:       mother -died 01-30-23  CAD, HTN, DM       fathr-died at 22 lung cancer       neg for  breast, colon cancer  Social History:    Reviewed history from 12/13/2007 and no changes required:       married 1966       2 sons, 1 grandchild       retired-[employer]       doing OK       March '09 - financially tough times   Risk Factors: Tobacco use:  quit    Year quit:  1976    Pack-years:  20 pk years Passive smoke exposure:  no Drug use:  no HIV high-risk behavior:  no Caffeine use:  4 drinks per day Alcohol use:  no Exercise:  no  Mammogram History:    Date of Last Mammogram:  07/26/2006  PAP Smear History:    Date of Last PAP Smear:  01/10/2005  Review of Systems      See HPI  Vital Signs:  Patient Profile:   65 Years Old Female Height:     66 inches Weight:      252 pounds O2 Sat:      94 % O2 treatment:    Room Air Temp:     98.0 degrees F oral Pulse rate:   96 / minute BP sitting:   120 / 76  (left arm) Cuff size:   large  Vitals Entered By: Michel Bickers CMA (December 08, 2008 1:40 PM)                Physical Exam  General:     well developed, well nourished, in no acute distressobese.   Head:     normocephalic and atraumatic Eyes:     PERRLA/EOM intact; conjunctiva and sclera clear Ears:     TMs intact and clear with normal canals Nose:     no deformity, discharge, inflammation, or lesions Mouth:     no deformity or lesions Neck:     no masses, thyromegaly, or abnormal cervical nodes Chest Wall:     no deformities noted Lungs:     clear bilaterally to auscultation and percussion Heart:     regular rate and rhythm, S1, S2 without murmurs, rubs, gallops, or clicks Abdomen:     bowel sounds positive; abdomen soft and non-tender without masses, or organomegaly Msk:     no deformity or scoliosis noted with normal posture Pulses:     pulses normal Extremities:     left foot in ankle wrap Neurologic:  CN  II-XII grossly intact with normal reflexes, coordination, muscle strength and tone Skin:     intact without lesions or rashes Cervical Nodes:     no significant adenopathy Axillary Nodes:     no significant adenopathy Psych:     alert and cooperative; normal mood and affect; normal attention span and concentration   Impression & Recommendations:  Problem # 1:  PULMONARY NODULE (ICD-518.89) Assessment: Comment Only many nodules on april 2009 ct chest - all stable since 2006.  plan no further followup   Problem # 2:  ASTHMA (ICD-493.90) Patient doing well with medication and says she is at her baseline.  Patients lungs clear today.    PLAN advair at dose of 125-50 1 puff two times a day continue singulair - she is not keen to quit this now due to approaching spring time. SHe will call in May 2010 to go over this by phone continue spiriva return in 6 months  Her updated medication list for this problem includes:    Albuterol 90 Mcg/act Aers (Albuterol) .[name]... As needed    Albuterol Sulfate (2.5 Mg/39ml) 0.083% Nebu (Albuterol sulfate) ..... Nebulized qid as needed    Spiriva Handihaler 18 Mcg Caps (Tiotropium bromide monohydrate) ..... Inhale contents of 1 capsule using handihaler daily.    Advair Diskus 100-50 Mcg/dose Misc (Fluticasone-salmeterol) .[name]... 1 puff two times a day    Singulair 10 Mg Tabs (Montelukast sodium) .[name]... 1 by mouth daily  Orders: Est. Patient Level II (16109)   Medications Added to Medication List This Visit: 1)  Spiriva Handihaler 18 Mcg Caps (Tiotropium bromide monohydrate) .... Inhale contents of 1 capsule using handihaler daily. 2)  Advair Diskus 100-50 Mcg/dose Misc (Fluticasone-salmeterol) .[name].. 1 puff two times a day 3)  Singulair 10 Mg Tabs (Montelukast sodium) .[name].. 1 by mouth daily  Other Orders: Est. Patient Level II (60454)  Patient Instructions: 1)  Continue advair, spiriva, and singulair 2)  Take albuterol as needed 3)  Call me in  May-June 2010 - if you are doing well at that time you can try stopping singulair 4)  Return to see me in 6 months 5)  If you run into breathing problems, call us or go to urgent care/er or come here 6)  Stay away from sick people

## 2010-11-09 NOTE — Assessment & Plan Note (Signed)
Summary: rov 2 months w/ spirometry///kp   Visit Type:  Follow-up Primary Provider/Referring Provider:  Norins  CC:  Pt here for 3 month follow-up. Pt states breathign is doing well. [name]  History of Present Illness: Hospital followup 02/04/2008 - 02/08/2008 after 2nd hospitaliation for pneumonia/ae-copd.[name] She was sent home on o2, spriiva, advair, and albuterol prn and prolonged solumedrol taper (all to prednisone). In Interim, followup had significant fatigure and deconditioning related dyspnea. Currently feels . No acute symptoms. No other new symptoms. She is now volunteering at [facility] [address].  Started rehab 3 days ago. OVeral dyspnea improved but still has some mild doe.   July 13., 2009 OV: ffollowup CT chest from 04/08/2008; shows clearance of pna from april 2009. REC: continue singular, advair, and spiriva for asthma/copd. Return wtih full pfts  OV 07/16/2008: Feels well. Lost 20# intentially. Attending pulmonary rehab. Unclear if dyspnea better; can do 15 minutes on treadmill.  She does feel stronger overall. No proiblems in interim. Has had flu shot. Wants to know if swine flu shot is available; not yet. PTs - fev1 1.8L/72%, Ratio 67 (73),  TLC 5L/91%, DLCO 20/71%. No BD response.   OV 12/08/2008: Feels well. In interim, had left foot surgery for hammer toes. tHis made her go off pulmonary rehab which she will recommence back in April. SHe is compliant with advair, singulair and spiriva. She has not needed her albuterol in many months. Denies dyspnea, wheeze, pnd, orthopnea, cough, hemoptysis, fever, chills, nausea, vomitting, diarrhea, edema. She is scared of the spring because that is when she gets her exacerbations typically and she does not want to taper her current medicatin regimenov 06/03/2009.  Mild AE-COPD. Rx with prednisone and augmentin  OV 09/15/2009. Followup GOld stage 2 COPD. Since last visit in August has been to [place] on 2 week cruiise. Reports enjoying trip and grea deal  of excitement at a new experience. COPD flared up mildly after return. Took antibiotics for 1 week that ended 10 days ago. Currently, bothered by post nasal drip, tickled in throat and consequent cough.   REC: nasacor, saline nasal spray, continue copd RX  OV 12/24/2009: Followup Gold stage 2 COPD and sinus drainage. Doing well. She feels at baseline. Continuing at rehab mainatenance. Struggling with weight loss. Still has some baseline wheeze. Resorts to albuterol use only occassionally. Compliant with spiriva and low dose advair hfa. Also, feels that sinus dryness gets worse/recurs when she does not use nasal steroids. Otherwise, feels well. No woresning in baseline dyspnea, phlegm. Denies  fever, hemoptysis, epistaxis, hemoptsysis, edema. REC: swithcing advair to dulera and fu with spiro  OV 02/23/2010: Followup Gold stage 2 COPD and sinus drainage in morbidly obese female Doing well. She feels at baseline. Switching to dulera has not made any change. Has lost 50# weight. Continuing at rehab mainatenance. Still has some baseline wheeze. Resorts to albuterol use only occassionally < 2 tims per week. Compliant with spiriva and dulera hfa. Sinus under control. Otherwise, feels well. No woresning in baseline dyspnea, phlegm. Denies  fever, hemoptysis, epistaxis, hemoptsysis, edema. REC: swithcing advair to dulera and fu with spiro. She is going on 15h road trip to [place] and isenquiring about DVT prophylaxis.   [facility] Spirometry  ID: [id number] Patient: [name], [name] DOB: 05/06/1946 Age: 65 Years Old Sex: Female Race: White Physician: Jacques Navy MD Height: 68 Weight: 231 Status: Confirmed Past Medical History:  IBS Hyperlipidemia Allergic rhinitis Pneumonia, hx of intertriginous yeast infection (breasts) COPD/ASthma March  2008, She had an FEV-1 of 60% with FEV-1: FVC ratio  rreduced at 57 and a normal     diffusion capacity back in 2007.  Dr.   Sung Amabile, who was her pulmonary  care physician, and was treating         her COPD with asthma with Spiriva/Advair 250/50 and Singulair 10 mg.  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 Valve was never visualized Heart murmur- Aortic Hammer toe deformities Pneumovax - July 2009 RLL nodule - 6mm stable 2006, april 2009 and June 2009. No further followup  Recorded: 02/23/2010 1:37 PM  Parameter  Measured Predicted %Predicted FVC     2.63        3.66        72 FEV1     1.95        2.81        69.40 FEV1%   74.03        77.21        95.90 PEF    4.63        6.65        69.70   Interpretation: Pre: FVC= 2.63L FEV1= 1.95L FEV1%= 74.0% 1.95/2.63 FEV1/FVC (02/23/2010 1:38:52 PM), Gold stage 2 COPD  Similar to baseline.  Current Medications (verified): 1)  Calcium 600 Mg  Tabs (Calcium) .... 2 Once Daily 2)  Aspirin 325 Mg  Tabs (Aspirin) .[name].. 1 Once Daily` 3)  Lovastatin 40 Mg  Tabs (Lovastatin) .[name].. 1 Once Daily 4)  Albuterol 90 Mcg/act  Aers (Albuterol) .... As Needed 5)  Albuterol Sulfate (2.5 Mg/30ml) 0.083%  Nebu (Albuterol Sulfate) .... Nebulized Qid As Needed 6)  Betamethasone Dipropionate 0.05 % Crea (Betamethasone Dipropionate) .... Apply Two Times A Day As Needed 7)  Spiriva Handihaler 18 Mcg Caps (Tiotropium Bromide Monohydrate) .... Inhale Contents of 1 Capsule Using Handihaler Daily. 8)  Singulair 10 Mg Tabs (Montelukast Sodium) .[name].. 1 By Mouth Daily 9)  Phentermine Hcl 37.5 Mg Tabs (Phentermine Hcl) .[name].. 1 By Mouth Q Am Fo Rweight Loss 10)  Nasonex 50 Mcg/act Susp (Mometasone Furoate) .... One Spary Daily 11)  Methylprednisolone (Pak) 4 Mg Tabs (Methylprednisolone) .... As Directed 12)  Ventolin Hfa 108 (90 Base) Mcg/act Aers (Albuterol Sulfate) .... 2 Puffs Every 4-6 Hours As Needed  Allergies (verified): 1)  Sulfa 2)  Codeine 3)  Prednisone  Past  History:  Family History: Last updated: August 17, 2007 mother -died 01-24-2023  CAD, HTN, DM fathr-died at 49 lung cancer neg for  breast, colon cancer  Social History: Last updated: 12/13/2007 married 1966 2 sons, 1 grandchild retired-[employer] doing OK March '09 - financially tough times  Risk Factors: Caffeine Use: 4 (Aug 17, 2007) Exercise: no (08-17-2007)  Risk Factors: Smoking Status: quit (05/03/2007) Passive Smoke Exposure: no (Aug 17, 2007)  Past Medical History: Reviewed history from 07/16/2008 and no changes required. IBS Hyperlipidemia Allergic rhinitis Pneumonia, hx of intertriginous yeast infection (breasts) COPD/ASthma March  2008, She had an FEV-1 of 60% with FEV-1: FVC ratio  rreduced at 81 and a normal     diffusion capacity back in 2007.  Dr.   Sung Amabile, who was her pulmonary care physician, and was treating         her COPD with asthma with Spiriva/Advair 250/50 and Singulair 10 mg.  October 2009: PFTs - fev1 1.8L/72%, Ratio 67 (73),  TLC 5L/91%,  DLCO 20/71% 15 pack smoking history - quit 1976 Ejection systolic murmur - echo 01/2008 shows lvh and likely gradient. Valve was never visualized Heart murmur- Aortic Hammer toe deformities Pneumovax - July 2009 RLL nodule - 6mm stable 2006, april 2009 and June 2009. No further followup  Past Surgical History: Reviewed history from 08/08/2007 and no changes required. Tonsillectomy Insertion of "X-stop" L4-5  Oct '06  Elsner Lumbar laminectomy-with fusion  L4-5 April '07  Elsner Carpal tunnel release-bilateral left Dec '04, right Dec '05   Meyerdierks bunionectomy right foot  G2P2 NSVD  Family History: Reviewed history from 08/08/2007 and no changes required. mother -died 02-06-23  CAD, HTN, DM fathr-died at 72 lung cancer neg for  breast, colon cancer  Social History: Reviewed history from 12/13/2007 and no changes required. married 1966 2 sons, 1 grandchild retired-[employer] doing OK March '09 - financially tough  times  Review of Systems  The patient denies shortness of breath with activity, shortness of breath at rest, productive cough, non-productive cough, coughing up blood, chest pain, irregular heartbeats, acid heartburn, indigestion, loss of appetite, weight change, abdominal pain, difficulty swallowing, sore throat, tooth/dental problems, headaches, nasal congestion/difficulty breathing through nose, sneezing, itching, ear ache, anxiety, depression, hand/feet swelling, joint stiffness or pain, rash, change in color of mucus, and fever.    Vital Signs:  Patient profile:   65 year old female Height:      68 inches Weight:      231 pounds O2 Sat:      95 % on Room air Temp:     98.0 degrees F oral Pulse rate:   81 / minute BP sitting:   140 / 68  (right arm) Cuff size:   regular  Vitals Entered By: Carron Curie CMA (Feb 23, 2010 1:33 PM)  O2 Flow:  Room air CC: Pt here for 3 month follow-up. Pt states breathign is doing well.  Comments Medications reviewed with patient Carron Curie CMA  Feb 23, 2010 1:34 PM Daytime phone number verified with patient.    Physical Exam  General:  Obese white female in no distess. Looks slimmer Head:  no tenderness to percussion over the frontal or maxillary sinuses Eyes:  C&S clear Ears:  R ear normal and L ear normal.   Nose:  left nose little's area is RAW with dried blood Mouth:  no gingival abnormalities.   Neck:  supple and full ROM.   Chest Wall:  no deformities noted Lungs:  Speaks full sentences. No acc muscle use. No distress. No cyanosis. decreased BS bilateral and prolonged exhilation.  No wheezt today Heart:  normal rate and regular rhythm.   Abdomen:  bowel sounds positive; abdomen soft and non-tender without masses, or organomegaly Msk:  no deformity or scoliosis noted with normal posture Pulses:  pulses normal Extremities:  no edema, no ulcers  Neurologic:  CN II-XII grossly intact with normal reflexes, coordination,  muscle strength and tone Skin:  turgor normal, color normal, no rashes, and no suspicious lesions.   Cervical Nodes:  no significant adenopathy Axillary Nodes:  no significant adenopathy Psych:  Oriented X3, memory intact for recent and remote, and normally interactive.     Pulmonary Function Test Date: 02/23/2010 1:37 PM Gender: Female  Pre-Spirometry FVC    Value: 2.63 L/min   % Pred: 72 % FEV1    Value: 1.95 L     Pred: 2.81 L     % Pred: 69.40 % FEV1/FVC  Value: 74.03 %     %  Pred: 95.90 %  Evaluation: moderate obstruction with significant bronchodilator response  Impression & Recommendations:  Problem # 1:  COUGH (ICD-786.2) Assessment Unchanged  stable disease and nno wheeze afer dulera even though subjectively she feels she weezes. Cleda Daub shows mild improvement  plan continue spiriva continue dulera (samples given) she will find out cost of dulear and if okay, will continue it rov 4 months  Orders: Est. Patient Level III (25852)  Problem # 2:  PREVENTIVE HEALTH CARE (ICD-V70.0) Assessment: New  Planning long road trip to [place]  PLAN For your road trip to [place] take one shot heaparin 5000 units subcutanous before each road trip Drink lot of fluids in the road trip If possible wear compression stocking for the trip Take breaks every few hours and strecth your legs In the car move your legs a lot to prevent blood clot  Orders: Est. Patient Level III (77824)  Medications Added to Medication List This Visit: 1)  Dulera 100-5 Mcg/act Aero (Mometasone furo-formoterol fum) .... 2 puffs twice per day 2)  Heparin Sodium (porcine) 5000 Unit/ml Soln (Heparin sodium (porcine)) .... Take 1 dose subcutaneously before road trip each time  Patient Instructions: 1)  continue spiriva and dulera and singulair as before 2)  use albuterol 2 puff as needed 3)  Take 2 samples of dulera 4)  Talk to your insurance and pharmacy and find out cost 5)  If dulera is cheaper or  same you can continue it 6)  For your road trip to [place] take one shot heaparin 5000 units subcutanous before each road trip 7)  Drink lot of fluids in the road trip 8)  If possible wear compression stocking for the trip 9)  Take breaks every few hours and strecth your legs 10)  In the car move your legs a lot to prevent blood clot 11)  ... 12)  ROV 4 months Prescriptions: HEPARIN SODIUM (PORCINE) 5000 UNIT/ML SOLN (HEPARIN SODIUM (PORCINE)) TAKE 1 DOSE SUBCUTANEOUSLY BEFORE ROAD TRIP EACH TIME  #2 x 1   Entered and Authorized by:   [name] MD   Signed by:   Carron Curie CMA on 02/23/2010   Method used:   Print then Give to Patient   RxID:   [id number] DULERA 100-5 MCG/ACT AERO (MOMETASONE FURO-FORMOTEROL FUM) 2 puffs twice per day  #1 x 6   Entered and Authorized by:   [name] MD   Signed by:   [name] MD on 02/23/2010   Method used:   Electronically to        [facility]. [address] [phone]* (retail)       [address] [place], [place]  [id number]       Ph: [phone]       Fax: [phone]   RxID:   [id number]

## 2010-11-09 NOTE — Progress Notes (Signed)
Summary: Benicar  Phone Note Call from Patient   Summary of Call: [phone] Patient requesting a decrease in Benicar 10mg . Patient's bp reading this AM was 89/46 with some dizziness. Pls. advise. Patient is going out of town tm. Initial call taken by: Beola Cord, CMA,  March 17, 2009 2:20 PM  Follow-up for Phone Call        OK to D/C benicar, can wait until back in town to adjust meds. If possible keep an on-going record of BP readings so we have some date to base recommendations for any new meds. Follow-up by: Jacques Navy MD,  March 17, 2009 2:40 PM  Additional Follow-up for Phone Call Additional follow up Details #1::        Patient aware of orders per Dr. Debby Bud. Patient verbalized understanding. Additional Follow-up by: Beola Cord, CMA,  March 17, 2009 3:16 PM

## 2010-11-09 NOTE — Assessment & Plan Note (Signed)
Summary: 3 months/ mbw   Visit Type:  Follow-up Primary Provider/Referring Provider:  Norins  CC:  Pt here for 3 month follow-up. Pt c/o head congestion with a scratchy throat x 3 weeks.  Pt needs refill on advair. Marland Kitchen  History of Present Illness: Hospital followup 02/04/2008 - 02/08/2008 after 2nd hospitaliation for pneumonia/ae-copd.Marland Kitchen She was sent home on o2, spriiva, advair, and albuterol prn and prolonged solumedrol taper (all to prednisone). In Interim, followup had significant fatigure and deconditioning related dyspnea. Currently feels . No acute symptoms. No other new symptoms. She is now volunteering at [facility] [address].  Started rehab 3 days ago. OVeral dyspnea improved but still has some mild doe.   July 13., 2009 OV: ffollowup CT chest from 04/08/2008; shows clearance of pna from april 2009. REC: continue singular, advair, and spiriva for asthma/copd. Return wtih full pfts  OV 07/16/2008: Feels well. Lost 20# intentially. Attending pulmonary rehab. Unclear if dyspnea better; can do 15 minutes on treadmill.  She does feel stronger overall. No proiblems in interim. Has had flu shot. Wants to know if swine flu shot is available; not yet. PTs - fev1 1.8L/72%, Ratio 67 (73),  TLC 5L/91%, DLCO 20/71%. No BD response.   OV 12/08/2008: Feels well. In interim, had left foot surgery for hammer toes. tHis made her go off pulmonary rehab which she will recommence back in April. SHe is compliant with advair, singulair and spiriva. She has not needed her albuterol in many months. Denies dyspnea, wheeze, pnd, orthopnea, cough, hemoptysis, fever, chills, nausea, vomitting, diarrhea, edema. She is scared of the spring because that is when she gets her exacerbations typically and she does not want to taper her current medicatin regimenov 06/03/2009.  Mild AE-COPD. Rx with prednisone and augmentin  OV 09/15/2009. Followup GOld stage 2 COPD. Since last visit in August has been to [place] on 2 week cruiise.  Reports enjoying trip and grea deal of excitement at a new experience. COPD flared up mildly after return. Took antibiotics for 1 week that ended 10 days ago. Currently, bothered by post nasal drip, tickled in throat and consequent cough. Otherwise, feels well. No woresning in baseline dyspnea, phlegm. Denies  fever, hemoptysis, epistaxis, hemoptsysis, edema  Current Medications (verified): 1)  Calcium 600 Mg  Tabs (Calcium) .... 2 Once Daily 2)  Aspirin 325 Mg  Tabs (Aspirin) .Marland Kitchen.. 1 Once Daily` 3)  Lovastatin 40 Mg  Tabs (Lovastatin) .Marland Kitchen.. 1 Once Daily 4)  Albuterol 90 Mcg/act  Aers (Albuterol) .... As Needed 5)  Albuterol Sulfate (2.5 Mg/41ml) 0.083%  Nebu (Albuterol Sulfate) .... Nebulized Qid As Needed 6)  Betamethasone Dipropionate 0.05 % Crea (Betamethasone Dipropionate) .... Apply Two Times A Day As Needed 7)  Spiriva Handihaler 18 Mcg Caps (Tiotropium Bromide Monohydrate) .... Inhale Contents of 1 Capsule Using Handihaler Daily. 8)  Advair Diskus 100-50 Mcg/dose Misc (Fluticasone-Salmeterol) .Marland Kitchen.. 1 Puff Two Times A Day 9)  Singulair 10 Mg Tabs (Montelukast Sodium) .Marland Kitchen.. 1 By Mouth Daily 10)  Sudafed 12 Hour 120 Mg Xr12h-Tab (Pseudoephedrine Hcl) .... Per Bottle 11)  Phentermine Hcl 37.5 Mg Tabs (Phentermine Hcl) .Marland Kitchen.. 1 By Mouth Q Am Fo Rweight Loss  Allergies (verified): 1)  Sulfa 2)  Codeine 3)  Prednisone  Past History:  Past Surgical History: Last updated: 08/08/2007 Tonsillectomy Insertion of "X-stop" L4-5  Oct '06  Elsner Lumbar laminectomy-with fusion  L4-5 April '07  Elsner Carpal tunnel release-bilateral left Dec '04, right Dec '05   Meyerdierks bunionectomy right foot  G2P2 NSVD  Family History: Last updated: 08-26-2007 mother -died February 02, 2023  CAD, HTN, DM fathr-died at 21 lung cancer neg for  breast, colon cancer  Social History: Last updated: 12/13/2007 married 1966 2 sons, 1 grandchild retired-[employer] doing OK March '09 - financially tough times  Risk  Factors: Caffeine Use: 4 (Aug 26, 2007) Exercise: no (Aug 26, 2007)  Risk Factors: Smoking Status: quit (05/03/2007) Passive Smoke Exposure: no (08/26/07)  Past Medical History: Reviewed history from 07/16/2008 and no changes required. IBS Hyperlipidemia Allergic rhinitis Pneumonia, hx of intertriginous yeast infection (breasts) COPD/ASthma March  2008, She had an FEV-1 of 60% with FEV-1: FVC ratio  rreduced at 38 and a normal     diffusion capacity back in 2007.  Dr.   Sung Amabile, who was her pulmonary care physician, and was treating         her COPD with asthma with Spiriva/Advair 250/50 and Singulair 10 mg.  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 Valve was never visualized Heart murmur- Aortic Hammer toe deformities Pneumovax - July 2009 RLL nodule - 6mm stable 2006, april 2009 and June 2009. No further followup  Family History: Reviewed history from 08-26-07 and no changes required. mother -died February 02, 2023  CAD, HTN, DM fathr-died at 69 lung cancer neg for  breast, colon cancer  Social History: Reviewed history from 12/13/2007 and no changes required. married 1966 2 sons, 1 grandchild retired-[employer] doing OK March '09 - financially tough times  Review of Systems       The patient complains of sore throat and nasal congestion/difficulty breathing through nose.  The patient denies shortness of breath with activity, shortness of breath at rest, productive cough, non-productive cough, coughing up blood, chest pain, irregular heartbeats, acid heartburn, indigestion, loss of appetite, weight change, abdominal pain, difficulty swallowing, tooth/dental problems, headaches, sneezing, itching, ear ache, anxiety, depression, hand/feet swelling, joint stiffness or pain, rash, change in color of mucus, and fever.    Vital Signs:  Patient profile:    65 year old female Height:      68 inches Weight:      235.50 pounds O2 Sat:      95 % on Room air Temp:     97.9 degrees F oral Pulse rate:   86 / minute BP sitting:   132 / 76  (left arm) Cuff size:   large  Vitals Entered By: Carron Curie CMA (September 15, 2009 2:47 PM)  O2 Flow:  Room air CC: Pt here for 3 month follow-up. Pt c/o head congestion with a scratchy throat x 3 weeks.  Pt needs refill on advair.  Comments Medications reviewed with patient Carron Curie CMA  September 15, 2009 2:49 PM    Physical Exam  General:  Obese white female in no distess Head:  no tenderness to percussion over the frontal or maxillary sinuses Eyes:  C&S clear Ears:  R ear normal and L ear normal.   Nose:  left nose little's area is RAW with dried blood Mouth:  no gingival abnormalities.   Neck:  supple and full ROM.   Chest Wall:  no deformities noted Lungs:  Speaks full sentences. No acc muscle use. No distress. No cyanosis. decreased BS bilateral and prolonged exhilation.  Scattered wheeze + Heart:  normal rate and regular rhythm.   Abdomen:  bowel sounds positive; abdomen  soft and non-tender without masses, or organomegaly Msk:  no deformity or scoliosis noted with normal posture Pulses:  pulses normal Extremities:  no edema, no ulcers  Neurologic:  CN II-XII grossly intact with normal reflexes, coordination, muscle strength and tone Skin:  turgor normal, color normal, no rashes, and no suspicious lesions.   Cervical Nodes:  no significant adenopathy Axillary Nodes:  no significant adenopathy Psych:  Oriented X3, memory intact for recent and remote, and normally interactive.     Impression & Recommendations:  Problem # 1:  COUGH (ICD-786.2) Assessment New  appears to have post viral reactive cough along with sinus drainage. In past inhaled steroids worked well for it. Some raw area in left nose LITTLEs area  plan sample of nasacort given to try daily for 1 month also  advised saline nasal spray  Orders: Est. Patient Level II (21308)  Problem # 2:  COPD (ICD-496) Assessment: Unchanged REcent flare. Currently stable but for scattered wheeze on exam.  plan monitor she will call if AE-COPD symptoms develop, will give abx and methylpred at that time rpov 3-4 months  Medications Added to Medication List This Visit: 1)  Nasonex 50 Mcg/act Susp (Mometasone furoate) .... Two puffs each nostril daily  Patient Instructions: 1)  take saline nasal spray once day or twice a day for a few days to keep your nose moinst 2)  take sample nasal steroid from [place] and use it once daily atleast for one month 3)  if your wheezing gets worse, or increased shortness of breath or cold, or phlegm or fever call us for medrol dose pak and antibiotics 4)  otherwise return in 3-4 months Prescriptions: NASONEX 50 MCG/ACT  SUSP (MOMETASONE FUROATE) Two puffs each nostril daily  #1 x 1   Entered and Authorized by:   [name] MD   Signed by:   [name] MD on 09/15/2009   Method used:   Electronically to        [facility]. [address] [phone]* (retail)       [address] [place], [place]  [id number]       Ph: [phone]       Fax: [phone]   RxID:   [id number]

## 2010-11-09 NOTE — Assessment & Plan Note (Signed)
Summary: cough SD   Vital Signs:  Patient Profile:   65 Years Old Female Height:     66 inches Weight:      263 pounds Temp:     97.8 degrees F oral Pulse rate:   90 / minute BP sitting:   118 / 62  (left arm) Cuff size:   large  Vitals Entered By: Zackery Barefoot CMA (August 05, 2008 4:35 PM)                 PCP:  Brocha Gilliam  Chief Complaint:  Wheezing/sore throat/chest tightness/S.O.B/cough x 1 day.  History of Present Illness: Recueent symptoms of bronchitis with asthmatic component. Started last night with a sore throat and rapidly progressed to wheezing, cough, increasd SOB similar to previous episodes.    Prior Medications Reviewed Using: Patient Recall  Updated Prior Medication List: CALCIUM 600 MG  TABS (CALCIUM) 2 once daily BENICAR HCT 40-12.5 MG  TABS (OLMESARTAN MEDOXOMIL-HCTZ) 1 once daily ASPIRIN 325 MG  TABS (ASPIRIN) 1 once daily` LOVASTATIN 40 MG  TABS (LOVASTATIN) 1 once daily ALBUTEROL 90 MCG/ACT  AERS (ALBUTEROL) as needed ADVAIR DISKUS 100-50 MCG/DOSE  MISC (FLUTICASONE-SALMETEROL) One puff twice daily ALBUTEROL SULFATE (2.5 MG/3ML) 0.083%  NEBU (ALBUTEROL SULFATE) nebulized qid as needed BETAMETHASONE DIPROPIONATE 0.05 % CREA (BETAMETHASONE DIPROPIONATE) apply two times a day as needed SINGULAIR 10 MG  TABS (MONTELUKAST SODIUM) One by mouth daily SPIRIVA HANDIHALER 18 MCG  CAPS (TIOTROPIUM BROMIDE MONOHYDRATE) one puffs in handihaler daily  Current Allergies: SULFA CODEINE PREDNISONE  Past Medical History:    Reviewed history from 07/16/2008 and no changes required:       IBS       Hyperlipidemia       Allergic rhinitis       Pneumonia, hx of       intertriginous yeast infection (breasts)       COPD/ASthma March  2008, She had an FEV-1 of 60% with FEV-1: FVC ratio  rreduced at 67 and a normal     diffusion capacity back in 2007.  Dr.   Sung Amabile, who was her pulmonary care physician, and was treating         her COPD with asthma with  Spiriva/Advair 250/50 and Singulair 10 mg.  October 2009: PFTs - fev1 1.8L/72%, Ratio 67 (73),  TLC 5L/91%, DLCO 20/71%       15 pack smoking history - quit 1976       Ejection systolic murmur - echo 01/2008 shows lvh and likely gradient. Valve was never visualized       Heart murmur- Aortic       Hammer toe deformities       Pneumovax - July 2009       RLL nodule - 6mm stable 2006, april 2009 and June 2009. No further followup  Past Surgical History:    Reviewed history from 08/08/2007 and no changes required:       Tonsillectomy       Insertion of "X-stop" L4-5  Oct '06  Elsner       Lumbar laminectomy-with fusion  L4-5 April '07  Elsner       Carpal tunnel release-bilateral left Dec '04, right Dec '05   Meyerdierks       bunionectomy right foot              G2P2 NSVD     Review of Systems       The patient complains of hoarseness, dyspnea on  exertion, and prolonged cough.  The patient denies anorexia, fever, weight loss, chest pain, peripheral edema, hemoptysis, and enlarged lymph nodes.     Physical Exam  General:     overwieght white female NAD Head:     Normocephalic and atraumatic without obvious abnormalities. No apparent alopecia or balding. Neck:     No deformities, masses, or tenderness noted. Lungs:     no increased work of breathing. End-expiratory high pitched wheezing with prolonged expiratory phase, no rales. Heart:     normal rate and regular rhythm.  II/VI systolic murmur best at RSB    Impression & Recommendations:  Problem # 1:  ACUTE BRONCHITIS (ICD-466.0) Patient with a flare of asthmatic bronchitis, similar to previous episode except we are there early.  Plan: Levaquin 500mg  once daily x 5, methylprednisolone 16mg  once daily x 3, 8 mg once daily x 4, 4mg  once daily x6 , continue routine meds.  Her updated medication list for this problem includes:    Albuterol 90 Mcg/act Aers (Albuterol) .Marland Kitchen... As needed    Advair Diskus 100-50 Mcg/dose Misc  (Fluticasone-salmeterol) ..... One puff twice daily    Albuterol Sulfate (2.5 Mg/105ml) 0.083% Nebu (Albuterol sulfate) ..... Nebulized qid as needed    Singulair 10 Mg Tabs (Montelukast sodium) ..... One by mouth daily    Spiriva Handihaler 18 Mcg Caps (Tiotropium bromide monohydrate) ..... One puffs in handihaler daily    Levaquin 500 Mg Tabs (Levofloxacin) .Marland Kitchen... 1 by mouth once daily x 5   Complete Medication List: 1)  Calcium 600 Mg Tabs (Calcium) .... 2 once daily 2)  Benicar Hct 40-12.5 Mg Tabs (Olmesartan medoxomil-hctz) .Marland Kitchen.. 1 once daily 3)  Aspirin 325 Mg Tabs (Aspirin) .Marland Kitchen.. 1 once daily` 4)  Lovastatin 40 Mg Tabs (Lovastatin) .Marland Kitchen.. 1 once daily 5)  Albuterol 90 Mcg/act Aers (Albuterol) .... As needed 6)  Advair Diskus 100-50 Mcg/dose Misc (Fluticasone-salmeterol) .... One puff twice daily 7)  Albuterol Sulfate (2.5 Mg/8ml) 0.083% Nebu (Albuterol sulfate) .... Nebulized qid as needed 8)  Betamethasone Dipropionate 0.05 % Crea (Betamethasone dipropionate) .... Apply two times a day as needed 9)  Singulair 10 Mg Tabs (Montelukast sodium) .... One by mouth daily 10)  Spiriva Handihaler 18 Mcg Caps (Tiotropium bromide monohydrate) .... One puffs in handihaler daily 11)  Methylprednisolone 4 Mg Tabs (Methylprednisolone) .... 4 tabs once daily x 3, 2 tabs once daily x 3, 1 tab once daily x 6 12)  Levaquin 500 Mg Tabs (Levofloxacin) .Marland Kitchen.. 1 by mouth once daily x 5   Patient Instructions: 1)  acute bronchitis - the usual: take levaquin 500mg  once daily x 5 days, methylprednisolone 16mg  once daily x 3, 8 mg q d x 3, 4mg  once daily x 6. Continue all your usual medications.   Prescriptions: LEVAQUIN 500 MG TABS (LEVOFLOXACIN) 1 by mouth once daily x 5  #5 x 0   Entered and Authorized by:   Jacques Navy MD   Signed by:   Jacques Navy MD on 08/05/2008   Method used:   Electronically to        [facility]. [phone]* (retail)       [address] [place], [place]  [address]       Ph: [phone]       Fax: [phone]   RxID:   [id number] METHYLPREDNISOLONE 4 MG TABS (METHYLPREDNISOLONE) 4 tabs once daily  x 3, 2 tabs once daily x 3, 1 tab once daily x 6  #24 x 0   Entered and Authorized by:   Jacques Navy MD   Signed by:   Jacques Navy MD on 08/05/2008   Method used:   Electronically to        [facility]. [phone]* (retail)       [address] [place], [place]  [address]       Ph: [phone]       Fax: [phone]   RxID:   [id number]  ]  Appended Document: cough SD noted her exacerbation on flag sent to me by Dr. Debby Bud. If she gets worse, we can see her in pulm.

## 2010-11-09 NOTE — Assessment & Plan Note (Signed)
Summary: POST HOSP/$50/NWS   Vital Signs:  Patient Profile:   65 Years Old Female Height:     66 inches Weight:      281 pounds Temp:     97.7 degrees F oral Pulse rate:   80 / minute BP sitting:   130 / 78  (left arm) Cuff size:   large  Vitals Entered By: Zackery Barefoot CMA (January 10, 2008 4:53 PM)                 PCP:  Brinsley Wence  Chief Complaint:  Hospital follow up.  History of Present Illness: recently hospitalized for LLL pneumonia with exacerbation of wheezing.  Discharge Saterday the 28th. Since being home she has been tapering down her methylprednisolone dose. When she dropped from 12mg  to 8mg  daily she had recurrent wheezing. No productive cough, no fever, fatigued.    Updated Prior Medication List: VITAMIN E 400 UNIT  CAPS (VITAMIN E) 2 TABS ONCE DAILY CALCIUM 600 MG  TABS (CALCIUM) 1 once daily EFFEXOR 75 MG  TABS (VENLAFAXINE HCL) 1 once daily BENICAR HCT 40-12.5 MG  TABS (OLMESARTAN MEDOXOMIL-HCTZ) 1 once daily ASPIRIN 325 MG  TABS (ASPIRIN) 1 once daily` LOVASTATIN 40 MG  TABS (LOVASTATIN) 1 once daily SPIRIVA HANDIHALER 18 MCG  CAPS (TIOTROPIUM BROMIDE MONOHYDRATE) once daily SINGULAIR 10 MG  TABS (MONTELUKAST SODIUM) 1 once daily ALBUTEROL SULFATE (2.5 MG/3ML) 0.083%  NEBU (ALBUTEROL SULFATE) QID as needed ALBUTEROL 90 MCG/ACT  AERS (ALBUTEROL) as needed ADVAIR DISKUS 250-50 MCG/DOSE MISC (FLUTICASONE-SALMETEROL) inhale 1 dose by mouth every 12 hours AUG BETAMETHASONE DIPROPIONATE 0.05 % CREA (AUG BETAMETHASONE DIPROPIONATE)  NYSTATIN 100000 UNIT/GM  POWD (NYSTATIN) apply to area of rash two times a day  Current Allergies: SULFA CODEINE PREDNISONE  Past Medical History:    Reviewed history from 08/08/2007 and no changes required:       IBS       Asthma       COPD       Hyperlipidemia       Allergic rhinitis  Past Surgical History:    Reviewed history from 08/08/2007 and no changes required:       Tonsillectomy       Insertion of "X-stop"  L4-5  Oct '06  Elsner       Lumbar laminectomy-with fusion  L4-5 April '07  Elsner       Carpal tunnel release-bilateral left Dec '04, right Dec '05   Meyerdierks       bunionectomy right foot              G2P2 NSVD     Review of Systems  The patient denies anorexia, fever, weight loss, weight gain, vision loss, chest pain, syncope, dyspnea on exhertion, abdominal pain, muscle weakness, difficulty walking, and angioedema.     Physical Exam  General:     alert, well-developed, well-nourished, well-hydrated, and overweight-appearing.   Head:     Normocephalic and atraumatic without obvious abnormalities. No apparent alopecia or balding. Lungs:     normal respiratory effort.  No increased WOB, she does have diffuse expiratory wheezing. Heart:     normal rate and regular rhythm.      Impression & Recommendations:  Problem # 1:  PNEUMONIA, LEFT (ICD-486) s/p hospitalization for pneumonia with a flare of asthmatic symptoms. She has completed course of antibiotics. On steroid taper she has developed increased wheezing with drop in dose. She has resumed advair and spiriva.  Plan: slower taper: medrol  4mg  three times a day until not wheezing, the two times a day x 6 days, then 1 once daily x 6.  Complete Medication List: 1)  Vitamin E 400 Unit Caps (Vitamin e) .... 2 tabs once daily 2)  Calcium 600 Mg Tabs (Calcium) .Marland Kitchen.. 1 once daily 3)  Effexor 75 Mg Tabs (Venlafaxine hcl) .Marland Kitchen.. 1 once daily 4)  Benicar Hct 40-12.5 Mg Tabs (Olmesartan medoxomil-hctz) .Marland Kitchen.. 1 once daily 5)  Aspirin 325 Mg Tabs (Aspirin) .Marland Kitchen.. 1 once daily` 6)  Lovastatin 40 Mg Tabs (Lovastatin) .Marland Kitchen.. 1 once daily 7)  Spiriva Handihaler 18 Mcg Caps (Tiotropium bromide monohydrate) .... Once daily 8)  Singulair 10 Mg Tabs (Montelukast sodium) .Marland Kitchen.. 1 once daily 9)  Albuterol Sulfate (2.5 Mg/36ml) 0.083% Nebu (Albuterol sulfate) .... Qid as needed 10)  Albuterol 90 Mcg/act Aers (Albuterol) .... As needed 11)  Advair  Diskus 250-50 Mcg/dose Misc (Fluticasone-salmeterol) .... Inhale 1 dose by mouth every 12 hours 12)  Aug Betamethasone Dipropionate 0.05 % Crea (Aug betamethasone dipropionate) 13)  Nystatin 100000 Unit/gm Powd (Nystatin) .... Apply to area of rash two times a day 14)  Methylprednisolone 4 Mg Tabs (Methylprednisolone) .... 2 once daily x 6, 1 once daily x 6     Prescriptions: METHYLPREDNISOLONE 4 MG  TABS (METHYLPREDNISOLONE) 2 once daily x 6, 1 once daily x 6  #18 x 1   Entered and Authorized by:   Jacques Navy MD   Signed by:   Jacques Navy MD on 01/10/2008   Method used:   Electronically sent to ...       [address]  [place]. [phone]*       [address] [place], [place]  [address]       Ph: [phone]       Fax: [phone]   RxID:   [id number]  ]

## 2010-11-09 NOTE — Progress Notes (Signed)
Summary: rash under breasts  Medications Added NYSTATIN 100000 UNIT/GM  POWD (NYSTATIN) apply to area of rash two times a day       Phone Note Call from Patient Call back at [facility] Phone [phone]   Summary of Call: Pt has had a redness & itching under both her breasts for 2 to 3 mths. She has tried constarch, benadryl, neosporin and tries to go without her bra as much as possible. Any suggestions or does she need to be seen? Initial call taken by: Lamar Sprinkles,  August 28, 2007 2:28 PM  Follow-up for Phone Call        trial of nystatin powder 100,000 units/gr apply to area of rash two times a day. 15 gr bottle, 2 refills. If this fails will need OV Follow-up by: Jacques Navy MD,  August 28, 2007 6:31 PM  Additional Follow-up for Phone Call Additional follow up Details #1::        Rx called in to pharmacist, lmovm for patient to call back or pick up rx at pharmacy. Additional Follow-up by: Rock Nephew CMA,  August 29, 2007 11:29 AM    New/Updated Medications: NYSTATIN 100000 UNIT/GM  POWD (NYSTATIN) apply to area of rash two times a day   Prescriptions: NYSTATIN 100000 UNIT/GM  POWD (NYSTATIN) apply to area of rash two times a day  #15gr per bot x 2   Entered by:   Rock Nephew CMA   Authorized by:   Jacques Navy MD   Signed by:   Rock Nephew CMA on 08/29/2007   Method used:   Telephoned to ...       [address]  [place]. [phone]*       [address] [place], [place]  [address]       Ph: [phone]       Fax: [phone]   RxID:   [id number]

## 2010-11-09 NOTE — Progress Notes (Signed)
Summary: h1n1  Phone Note Call from Patient   Summary of Call: pt called and stated that she had an h1n1 vaccine during the week of 12/12 and she needs proof for her job that she got the vaccine and would like [place] to fax it. Pt said to please call her back for the fax number  Initial call taken by: Windell Norfolk,  October 30, 2008 12:52 PM  Follow-up for Phone Call        called pt and she said to fax to attn: Maxwell Caul [phone].... and she will come pick up the hard copy...done Follow-up by: Windell Norfolk,  October 30, 2008 1:24 PM
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
## 2010-11-09 NOTE — Assessment & Plan Note (Signed)
Summary: ?bronchitis/cd   Vital Signs:  Patient profile:   65 year old female Height:      68 inches Weight:      235 pounds BMI:     35.86 O2 Sat:      95 % on Room air Temp:     97.6 degrees F oral Pulse rate:   80 / minute BP sitting:   142 / 90  (left arm) Cuff size:   large  Vitals Entered By: Beola Cord, CMA (May 25, 2009 11:09 AM)  O2 Flow:  Room air CC: poss bronchitis Comments D/C Benicar and Methylprednisolone   Primary Care Provider:  Rowyn Mustapha  CC:  poss bronchitis.  History of Present Illness: Patient presents for acute respiratory complaints: had chills yesterday, sweat during the night, sinus congestion, mild wheezing (started nebulizer Saturday), no rhinorrhea. Has been using nose spray. Increased shortness of breath.   Current Medications (verified): 1)  Calcium 600 Mg  Tabs (Calcium) .... 2 Once Daily 2)  Benicar Hct 40-12.5 Mg  Tabs (Olmesartan Medoxomil-Hctz) .Marland Kitchen.. 1 Once Daily 3)  Aspirin 325 Mg  Tabs (Aspirin) .Marland Kitchen.. 1 Once Daily` 4)  Lovastatin 40 Mg  Tabs (Lovastatin) .Marland Kitchen.. 1 Once Daily 5)  Albuterol 90 Mcg/act  Aers (Albuterol) .... As Needed 6)  Albuterol Sulfate (2.5 Mg/26ml) 0.083%  Nebu (Albuterol Sulfate) .... Nebulized Qid As Needed 7)  Betamethasone Dipropionate 0.05 % Crea (Betamethasone Dipropionate) .... Apply Two Times A Day As Needed 8)  Spiriva Handihaler 18 Mcg Caps (Tiotropium Bromide Monohydrate) .... Inhale Contents of 1 Capsule Using Handihaler Daily. 9)  Advair Diskus 100-50 Mcg/dose Misc (Fluticasone-Salmeterol) .Marland Kitchen.. 1 Puff Two Times A Day 10)  Singulair 10 Mg Tabs (Montelukast Sodium) .Marland Kitchen.. 1 By Mouth Daily 11)  Diflucan 100 Mg Tabs (Fluconazole) .... Take 1 Tablet By Mouth Once A Day X 10days As Needed 12)  Hydroxyzine Hcl 25 Mg Tabs (Hydroxyzine Hcl) .Marland Kitchen.. 1 - 2 By Mouth Q 6 Hrs As Needed For Itching 13)  Methylprednisolone 8 Mg Tabs (Methylprednisolone) .... 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  Dr.   Sung Amabile, who was her pulmonary care physician, and was treating         her COPD with asthma with Spiriva/Advair 250/50 and Singulair 10 mg.  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 Valve was never visualized Heart murmur- Aortic Hammer toe deformities Pneumovax - July 2009 RLL nodule - 6mm stable 2006, april 2009 and June 2009. No further followup  Past Surgical History: Last updated: 08/08/2007 Tonsillectomy Insertion of "X-stop" L4-5  Oct '06  Elsner Lumbar laminectomy-with fusion  L4-5 April '07  Elsner Carpal tunnel release-bilateral left Dec '04, right Dec '05   Meyerdierks bunionectomy right foot  G2P2 NSVD  Review of Systems       The patient complains of fever, hoarseness, prolonged cough, and headaches.  The patient denies  anorexia, weight loss, weight gain, chest pain, abdominal pain, hematochezia, hematuria, and depression.    Physical Exam  General:  Obese white female in no distess Head:  no tenderness to percussion over the frontal or maxillary sinuses Eyes:  C&S clear Neck:  supple and full ROM.   Lungs:  normal respiratory effort and normal breath sounds.  Coarse rhonchi at right base, end-expiratory wheeze worse on the right, no increased WOB Heart:  normal rate and regular rhythm.     Impression & Recommendations:  Problem # 1:  ACUTE BRONCHITIS (ICD-466.0) Patient with early symptoms with a history of chronic bronchitis.l  Plan:  sudafed 30mg  three times a day,           Augmentin 875 two times a day x 7          continue all meds.   Her updated medication list for this problem includes:    Albuterol 90 Mcg/act Aers (Albuterol) .Marland Kitchen... As needed    Albuterol Sulfate (2.5 Mg/6ml) 0.083% Nebu (Albuterol sulfate) ..... Nebulized qid as needed    Spiriva Handihaler 18 Mcg Caps (Tiotropium bromide monohydrate) ..... Inhale contents of 1 capsule using handihaler daily.    Advair Diskus 100-50 Mcg/dose Misc (Fluticasone-salmeterol) .Marland Kitchen... 1 puff two times a day    Singulair 10 Mg Tabs (Montelukast sodium) .Marland Kitchen... 1 by mouth daily    Amoxicillin-pot Clavulanate 875-125 Mg Tabs (Amoxicillin-pot clavulanate) .Marland Kitchen... 1 by mouth two times a day x 7  Complete Medication List: 1)  Calcium 600 Mg Tabs (Calcium) .... 2 once daily 2)  Benicar Hct 40-12.5 Mg Tabs (Olmesartan medoxomil-hctz) .Marland Kitchen.. 1 once daily 3)  Aspirin 325 Mg Tabs (Aspirin) .Marland Kitchen.. 1 once daily` 4)  Lovastatin 40 Mg Tabs (Lovastatin) .Marland Kitchen.. 1 once daily 5)  Albuterol 90 Mcg/act Aers (Albuterol) .... As needed 6)  Albuterol Sulfate (2.5 Mg/64ml) 0.083% Nebu (Albuterol sulfate) .... Nebulized qid as needed 7)  Betamethasone Dipropionate 0.05 % Crea (Betamethasone dipropionate) .... Apply two times a day as needed 8)  Spiriva Handihaler 18 Mcg Caps (Tiotropium bromide monohydrate) .... Inhale contents of 1 capsule using handihaler daily. 9)  Advair Diskus 100-50 Mcg/dose Misc (Fluticasone-salmeterol) .Marland Kitchen.. 1 puff two times a day 10)  Singulair 10 Mg Tabs (Montelukast sodium) .Marland Kitchen.. 1 by mouth daily 11)  Diflucan 100 Mg Tabs (Fluconazole) .... Take 1 tablet by mouth once a day x 10days as needed 12)  Hydroxyzine Hcl 25 Mg Tabs (Hydroxyzine hcl) .Marland Kitchen.. 1 - 2 by mouth q 6 hrs as needed for itching 13)  Methylprednisolone 8 Mg Tabs (Methylprednisolone) .... 4 tabs once daily x 3, 3 tabs once daily x 3, 2 tabs once daily x 3, 1 tab once daily x 6 14)  Amoxicillin-pot Clavulanate 875-125 Mg  Tabs (Amoxicillin-pot clavulanate) .Marland Kitchen.. 1 by mouth two times a day x 7  Patient Instructions: 1)  Bronchitis- getting there early.  Plan - augmentin generic twice a day for 7 days. Sudafed (generic ) 30mg  three times a day for as long as you are congested. continue all your regular meds.  Prescriptions: AMOXICILLIN-POT CLAVULANATE 875-125 MG TABS (AMOXICILLIN-POT CLAVULANATE) 1 by mouth two times a day x 7  #14 x 0   Entered and Authorized by:   Jacques Navy MD   Signed by:   Jacques Navy MD on 05/25/2009   Method used:   Print then Give to Patient   RxID:   [id number]

## 2010-11-09 NOTE — Miscellaneous (Signed)
Clinical Lists Changes  Medications: Added new medication of DOXYCYCLINE HYCLATE 100 MG CAPS (DOXYCYCLINE HYCLATE) Take 1 tab twice a day - Signed Added new medication of ALBUTEROL SULFATE (2.5 MG/3ML) 0.083%  NEBU (ALBUTEROL SULFATE) nebulized qid as needed - Signed Rx of METHYLPREDNISOLONE 4 MG  TABS (METHYLPREDNISOLONE) as directed;  #100 x 0;  Signed;  Entered by: Birdie Sons MD;  Authorized by: Birdie Sons MD;  Method used: Electronic Rx of DOXYCYCLINE HYCLATE 100 MG CAPS (DOXYCYCLINE HYCLATE) Take 1 tab twice a day;  #14 x 0;  Signed;  Entered by: Birdie Sons MD;  Authorized by: Birdie Sons MD;  Method used: Electronic Rx of ALBUTEROL SULFATE (2.5 MG/3ML) 0.083%  NEBU (ALBUTEROL SULFATE) nebulized qid as needed;  #120 x 1;  Signed;  Entered by: Birdie Sons MD;  Authorized by: Birdie Sons MD;  Method used: Electronic Observations: Added new observation of ECHOFINDING:  Ejection Fraction:  >=50%.SUMMARY   -  The mean transaortic valve gradient was 26 mmHg.   -  Images non-diagnostic: Appears to be LVH with probably normal EF.         Doppler suggests possibility of a gradient across AV but         never visualized. Doppler also suggests mild MR. RV appears         normal size and there is no pericardial effusion.Suggest         alternative imageing modality if clinically indicated   IMPRESSIONS   -  Images non-diagnostic: Appears to be LVH with probably normal EF.         Doppler suggests possibility of a gradient across AV but         never visualized. Doppler also suggests mild MR. RV appears         normal size and there is no pericardial effusion.Suggest         alternative imageing modality if clinically indicated     ---------------------------------------------------------------    Prepared and Electronically Authenticated by    Charlton Haws [name]   Confirmed 06-Feb-2008 15:01:20  Additional Information  HL7 RESULT STATUS : F  External IF Update Timestamp :  2008-02-06:15:01:00.000000    (02/06/2008 9:47) Added new observation of CT OF CHEST: abnormal:  IMPRESSION:    1.  Interval development of patchy alveolar opacities throughout   both lungs, suggesting these are not developing   infectious/inflammatory process or less likely of metastatic   disease.   2.  The previously demonstrated right lung nodular regions are   stable.   3.  Coronary and aortic calcifications.   4.  No new adenopathy.    Read By:  Deanne Coffer, D. Reuel Boom,  [name]   Released By:  Corlis Leak. Reuel Boom,  [name]  Additional Information  HL7 RESULT STATUS : F  External image : [id number]  External IF Update Timestamp : 2008-02-04:19:53:37.000000  (02/04/2008 9:44)    Prescriptions: ALBUTEROL SULFATE (2.5 MG/3ML) 0.083%  NEBU (ALBUTEROL SULFATE) nebulized qid as needed  #120 x 1   Entered and Authorized by:   Birdie Sons MD   Signed by:   Birdie Sons MD on 02/08/2008   Method used:   Electronically sent to ...       [address]  [place]. [phone]*       [address] [place], [place]  [address]       Ph: [phone]  Fax: [phone]   RxID:   [id number] DOXYCYCLINE HYCLATE 100 MG CAPS (DOXYCYCLINE HYCLATE) Take 1 tab twice a day  #14 x 0   Entered and Authorized by:   Birdie Sons MD   Signed by:   Birdie Sons MD on 02/08/2008   Method used:   Electronically sent to ...       [address]  [place]. [phone]*       [address] [place], [place]  [address]       Ph: [phone]       Fax: [phone]   RxID:   [id number] METHYLPREDNISOLONE 4 MG  TABS (METHYLPREDNISOLONE) as directed  #100 x 0   Entered and Authorized by:   Birdie Sons MD   Signed by:   Birdie Sons MD on 02/08/2008   Method used:   Electronically sent to ...       [address]  [place]. [phone]*       [address] [place], [place]  [address]       Ph: [phone]       Fax: [phone]    RxID:   [id number]     CT of Chest  Procedure date:  02/04/2008  Findings:      abnormal:  IMPRESSION:    1.  Interval development of patchy alveolar opacities throughout   both lungs, suggesting these are not developing   infectious/inflammatory process or less likely of metastatic   disease.   2.  The previously demonstrated right lung nodular regions are   stable.   3.  Coronary and aortic calcifications.   4.  No new adenopathy.    Read By:  Deanne Coffer, D. Reuel Boom,  [name]   Released By:  Corlis Leak. Reuel Boom,  [name]  Additional Information  HL7 RESULT STATUS : F  External image : [id number]  External IF Update Timestamp : 2008-02-04:19:53:37.000000   Echocardiogram  Procedure date:  02/06/2008  Findings:       Ejection Fraction:  >=50%.SUMMARY   -  The mean transaortic valve gradient was 26 mmHg.   -  Images non-diagnostic: Appears to be LVH with probably normal EF.         Doppler suggests possibility of a gradient across AV but         never visualized. Doppler also suggests mild MR. RV appears         normal size and there is no pericardial effusion.Suggest         alternative imageing modality if clinically indicated   IMPRESSIONS   -  Images non-diagnostic: Appears to be LVH with probably normal EF.         Doppler suggests possibility of a gradient across AV but         never visualized. Doppler also suggests mild MR. RV appears         normal size and there is no pericardial effusion.Suggest         alternative imageing modality if clinically indicated     ---------------------------------------------------------------    Prepared and Electronically Authenticated by    Charlton Haws [name]   Confirmed 06-Feb-2008 15:01:20  Additional Information  HL7 RESULT STATUS : F  External IF Update Timestamp : 2008-02-06:15:01:00.000000      CT of Chest  Procedure date:  02/04/2008  Findings:      abnormal:  IMPRESSION:    1.  Interval  development of patchy alveolar opacities throughout   both lungs, suggesting these are not developing   infectious/inflammatory process or less likely of metastatic   disease.   2.  The previously demonstrated right lung nodular regions are   stable.   3.  Coronary and aortic calcifications.   4.  No new adenopathy.    Read By:  Deanne Coffer, D. Reuel Boom,  [name]   Released By:  Corlis Leak. Reuel Boom,  [name]  Additional Information  HL7 RESULT STATUS : F  External image : [id number]  External IF Update Timestamp : 2008-02-04:19:53:37.000000   Echocardiogram  Procedure date:  02/06/2008  Findings:       Ejection Fraction:  >=50%.SUMMARY   -  The mean transaortic valve gradient was 26 mmHg.   -  Images non-diagnostic: Appears to be LVH with probably normal EF.         Doppler suggests possibility of a gradient across AV but         never visualized. Doppler also suggests mild MR. RV appears         normal size and there is no pericardial effusion.Suggest         alternative imageing modality if clinically indicated   IMPRESSIONS   -  Images non-diagnostic: Appears to be LVH with probably normal EF.         Doppler suggests possibility of a gradient across AV but         never visualized. Doppler also suggests mild MR. RV appears         normal size and there is no pericardial effusion.Suggest         alternative imageing modality if clinically indicated     ---------------------------------------------------------------    Prepared and Electronically Authenticated by    Charlton Haws [name]   Confirmed 06-Feb-2008 15:01:20  Additional Information  HL7 RESULT STATUS : F  External IF Update Timestamp : 2008-02-06:15:01:00.000000

## 2010-11-09 NOTE — Miscellaneous (Signed)
Summary: Discharge/[facility]  Discharge/[place]   Imported By: Lester Conshohocken 05/03/2010 10:46:43  _____________________________________________________________________  External Attachment:    Type:   Image     Comment:   External Document

## 2010-11-09 NOTE — Assessment & Plan Note (Signed)
Summary: sore throat/cd   Vital Signs:  Patient profile:   65 year old female Height:      68 inches Weight:      227 pounds BMI:     34.64 O2 Sat:      95 % on Room air Temp:     96.8 degrees F oral Pulse rate:   77 / minute BP sitting:   156 / 78  (left arm) Cuff size:   regular  Vitals Entered By: Bill Salinas CMA (February 01, 2010 9:25 AM)  O2 Flow:  Room air CC: pt here with complaint of sinus drainage x 2days with sore throat that just started last night. Pt denies any fever/ never had zostavax or colonoscopy/ ab   Primary Care Provider:  Sherryll Skoczylas  CC:  pt here with complaint of sinus drainage x 2days with sore throat that just started last night. Pt denies any fever/ never had zostavax or colonoscopy/ ab.  History of Present Illness: Patient presents for the early symptoms of a recurrent bronchitis. She is not febrile and is not particularly short of breath. She has a history of developing serious bronchitis, including previous hospitalizations.   Current Medications (verified): 1)  Calcium 600 Mg  Tabs (Calcium) .... 2 Once Daily 2)  Aspirin 325 Mg  Tabs (Aspirin) .Marland Kitchen.. 1 Once Daily` 3)  Lovastatin 40 Mg  Tabs (Lovastatin) .Marland Kitchen.. 1 Once Daily 4)  Albuterol 90 Mcg/act  Aers (Albuterol) .... As Needed 5)  Albuterol Sulfate (2.5 Mg/49ml) 0.083%  Nebu (Albuterol Sulfate) .... Nebulized Qid As Needed 6)  Betamethasone Dipropionate 0.05 % Crea (Betamethasone Dipropionate) .... Apply Two Times A Day As Needed 7)  Spiriva Handihaler 18 Mcg Caps (Tiotropium Bromide Monohydrate) .... Inhale Contents of 1 Capsule Using Handihaler Daily. 8)  Singulair 10 Mg Tabs (Montelukast Sodium) .Marland Kitchen.. 1 By Mouth Daily 9)  Phentermine Hcl 37.5 Mg Tabs (Phentermine Hcl) .Marland Kitchen.. 1 By Mouth Q Am Fo Rweight Loss 10)  Nasonex 50 Mcg/act Susp (Mometasone Furoate) .... 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  Dr.   Sung Amabile, who was her pulmonary care physician, and was treating         her COPD with asthma with Spiriva/Advair 250/50 and Singulair 10 mg.  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 Valve was never visualized Heart murmur- Aortic Hammer toe deformities Pneumovax - July 2009 RLL nodule - 6mm stable 2006, april 2009 and June 2009. No further followup  Past Surgical History: Last updated: 08/21/2007 Tonsillectomy Insertion of "X-stop" L4-5  Oct '06  Elsner Lumbar laminectomy-with fusion  L4-5 April '07  Elsner Carpal tunnel release-bilateral left Dec '04, right Dec '05   Meyerdierks bunionectomy right foot  G2P2 NSVD  Family History: Last updated: Aug 21, 2007 mother -died 2023-01-28  CAD, HTN, DM fathr-died at 77 lung cancer neg for  breast, colon cancer  Social History: Last updated: 12/13/2007 married 1966 2 sons, 1 grandchild retired-[employer] doing OK March '  09 - financially tough times  Review of Systems       The patient complains of dyspnea on exertion.  The patient denies anorexia, fever, weight loss, weight gain, decreased hearing, chest pain, syncope, peripheral edema, abdominal pain, hematochezia, muscle weakness, and difficulty walking.    Physical Exam  General:  overweight white female in no distress Head:  normocephalic and atraumatic.   Neck:  supple and full ROM.   Lungs:  normal respiratory effort, no intercostal retractions, and no accessory muscle use.  Diffuse mild expiratory wheeze (on dulera) Heart:   normal rate and regular rhythm.  III/VI holosystolic high pitched mrumur best at the RSB Msk:  normal ROM and no joint tenderness.   Pulses:  2+ radial pulse Neurologic:  alert & oriented X3, cranial nerves II-XII intact, and gait normal.     Impression & Recommendations:  Problem # 1:  ACUTE BRONCHITIS (ICD-466.0) Recurrent bronchitis, though early  Plan - doxycycline 100mg  two times a day x 7 days           Medrol dose pak.   Her updated medication list for this problem includes:    Albuterol 90 Mcg/act Aers (Albuterol) .Marland Kitchen... As needed    Albuterol Sulfate (2.5 Mg/41ml) 0.083% Nebu (Albuterol sulfate) ..... Nebulized qid as needed    Spiriva Handihaler 18 Mcg Caps (Tiotropium bromide monohydrate) ..... Inhale contents of 1 capsule using handihaler daily.    Singulair 10 Mg Tabs (Montelukast sodium) .Marland Kitchen... 1 by mouth daily    Dulera 100-5 Mcg/act Aero (Mometasone furo-formoterol fum)    Doxycycline Hyclate 100 Mg Caps (Doxycycline hyclate) .Marland Kitchen... 1 by mouth two times a day x 7 days  Problem # 2:  CARDIAC MURMUR, AORTIC (ICD-785.2) probable aortic stenosis. She is already scheduled for follow-up 2D echo.  Complete Medication List: 1)  Calcium 600 Mg Tabs (Calcium) .... 2 once daily 2)  Aspirin 325 Mg Tabs (Aspirin) .Marland Kitchen.. 1 once daily` 3)  Lovastatin 40 Mg Tabs (Lovastatin) .Marland Kitchen.. 1 once daily 4)  Albuterol 90 Mcg/act Aers (Albuterol) .... As needed 5)  Albuterol Sulfate (2.5 Mg/74ml) 0.083% Nebu (Albuterol sulfate) .... Nebulized qid as needed 6)  Betamethasone Dipropionate 0.05 % Crea (Betamethasone dipropionate) .... Apply two times a day as needed 7)  Spiriva Handihaler 18 Mcg Caps (Tiotropium bromide monohydrate) .... Inhale contents of 1 capsule using handihaler daily. 8)  Singulair 10 Mg Tabs (Montelukast sodium) .Marland Kitchen.. 1 by mouth daily 9)  Phentermine Hcl 37.5 Mg Tabs (Phentermine hcl) .Marland Kitchen.. 1 by mouth q am fo rweight loss 10)  Nasonex 50 Mcg/act Susp (Mometasone furoate) .... One  spary daily 11)  Dulera 100-5 Mcg/act Aero (Mometasone furo-formoterol fum) 12)  Doxycycline Hyclate 100 Mg Caps (Doxycycline hyclate) .Marland Kitchen.. 1 by mouth two times a day x 7 days 13)  Methylprednisolone (pak) 4 Mg Tabs (Methylprednisolone) .... As directed Prescriptions: METHYLPREDNISOLONE (PAK) 4 MG TABS (METHYLPREDNISOLONE) as directed  #1 x 0   Entered and Authorized by:   Jacques Navy MD   Signed by:   Jacques Navy MD on 02/01/2010   Method used:   Electronically to        [facility]. [address] [phone]* (retail)       [address] [place], [place]  [id number]       Ph: [phone]       Fax: [phone]   RxID:   [id number] DOXYCYCLINE HYCLATE 100 MG CAPS (DOXYCYCLINE HYCLATE) 1 by  mouth two times a day x 7 days  #14 x 0   Entered and Authorized by:   Jacques Navy MD   Signed by:   Jacques Navy MD on 02/01/2010   Method used:   Electronically to        [facility]. [address] [phone]* (retail)       [address] [place], [place]  [id number]       Ph: [phone]       Fax: [phone]   RxID:   [id number]

## 2010-11-09 NOTE — Miscellaneous (Signed)
[facility]   Imported By: Esmeralda Links D'jimraou 03/06/2008 15:00:18  _____________________________________________________________________  External Attachment:    Type:   Image     Comment:   External Document

## 2010-11-09 NOTE — Assessment & Plan Note (Signed)
Summary: PER TRIAGE NOTE/WILL BRING A BOOK/CD   Vital Signs:  Patient profile:   65 year old female Height:      68 inches Weight:      243.50 pounds BMI:     37.16 Temp:     97.6 degrees F oral Pulse rate:   68 / minute Pulse rhythm:   regular BP sitting:   152 / 78  (right arm)  Primary Care Provider:  Neria Procter   History of Present Illness: Has continued with pulmonary rehab and was having low blood pressure and was instructed to reduce BP meds. She mad an out of town trip and developed bronchitis. She was seen by a NP at [facility]. She was started on Augmentin, given a breathing treatment and was started on methyprednisolone 40mg  daily x 5 days with no taper.  She has been off BP meds for a week: BP 128/52 to 108/52. She has not noticed any difference in how she feels.   Current Medications (verified): 1)  Calcium 600 Mg  Tabs (Calcium) .... 2 Once Daily 2)  Benicar Hct 40-12.5 Mg  Tabs (Olmesartan Medoxomil-Hctz) .Marland Kitchen.. 1 Once Daily 3)  Aspirin 325 Mg  Tabs (Aspirin) .Marland Kitchen.. 1 Once Daily` 4)  Lovastatin 40 Mg  Tabs (Lovastatin) .Marland Kitchen.. 1 Once Daily 5)  Albuterol 90 Mcg/act  Aers (Albuterol) .... As Needed 6)  Albuterol Sulfate (2.5 Mg/28ml) 0.083%  Nebu (Albuterol Sulfate) .... Nebulized Qid As Needed 7)  Betamethasone Dipropionate 0.05 % Crea (Betamethasone Dipropionate) .... Apply Two Times A Day As Needed 8)  Spiriva Handihaler 18 Mcg Caps (Tiotropium Bromide Monohydrate) .... Inhale Contents of 1 Capsule Using Handihaler Daily. 9)  Advair Diskus 100-50 Mcg/dose Misc (Fluticasone-Salmeterol) .Marland Kitchen.. 1 Puff Two Times A Day 10)  Singulair 10 Mg Tabs (Montelukast Sodium) .Marland Kitchen.. 1 By Mouth Daily 11)  Diflucan 100 Mg Tabs (Fluconazole) .... Take 1 Tablet By Mouth Once A Day X 10days As Needed 12)  Hydroxyzine Hcl 25 Mg Tabs (Hydroxyzine Hcl) .Marland Kitchen.. 1 - 2 By Mouth Q 6 Hrs As Needed For Itching  Allergies: 1)  Sulfa 2)  Codeine 3)  Prednisone PMH-FH-SH reviewed for relevance  Review of  Systems  The patient denies anorexia, fever, weight loss, weight gain, decreased hearing, chest pain, syncope, peripheral edema, hemoptysis, abdominal pain, severe indigestion/heartburn, and muscle weakness.    Physical Exam  General:  alert, well-developed, well-nourished, well-hydrated, and normal appearance.   Head:  normocephalic and atraumatic.   Lungs:  normal respiratory effort.  Expiratory wheezing through out all lung fields, no increased work of breathing.  Heart:  normal rate, regular rhythm, and no murmur.   Skin:  turgor normal, color normal, no rashes, and no suspicious lesions.   Psych:  Oriented X3, memory intact for recent and remote, and normally interactive.     Impression & Recommendations:  Problem # 1:  ACUTE BRONCHITIS (ICD-466.0) flare of bronchitis on Augmentin and feeling better but still wheezing.  Plan: continue augmentin. Medrol 8 mg 4 tabs once daily x 3, 3 tabs once daily x 3, 2 tabs q d x 3, 1 tab once daily x 6.         cough syrup of choice.   Her updated medication list for this problem includes:    Albuterol 90 Mcg/act Aers (Albuterol) .Marland Kitchen... As needed    Albuterol Sulfate (2.5 Mg/54ml) 0.083% Nebu (Albuterol sulfate) ..... Nebulized qid as needed    Spiriva Handihaler 18 Mcg Caps (Tiotropium bromide monohydrate) ..... Inhale  contents of 1 capsule using handihaler daily.    Advair Diskus 100-50 Mcg/dose Misc (Fluticasone-salmeterol) .Marland Kitchen... 1 puff two times a day    Singulair 10 Mg Tabs (Montelukast sodium) .Marland Kitchen... 1 by mouth daily  Problem # 2:  UNSPECIFIED ESSENTIAL HYPERTENSION (ICD-401.9) Has been on benicar/hct but since loosing weight and going to pul rehab has had lower  BP. REviewed record from home as noted.  Plan: hold meds          monitor at home - will restart med for SBP running in the high 130s.  Her updated medication list for this problem includes:    Benicar Hct 40-12.5 Mg Tabs (Olmesartan medoxomil-hctz) .Marland Kitchen... 1 once daily   Complete Medication List: 1)  Calcium 600 Mg Tabs (Calcium) .... 2 once daily 2)  Benicar Hct 40-12.5 Mg Tabs (Olmesartan medoxomil-hctz) .Marland Kitchen.. 1 once daily 3)  Aspirin 325 Mg Tabs (Aspirin) .Marland Kitchen.. 1 once daily` 4)  Lovastatin 40 Mg Tabs (Lovastatin) .Marland Kitchen.. 1 once daily 5)  Albuterol 90 Mcg/act Aers (Albuterol) .... As needed 6)  Albuterol Sulfate (2.5 Mg/83ml) 0.083% Nebu (Albuterol sulfate) .... Nebulized qid as needed 7)  Betamethasone Dipropionate 0.05 % Crea (Betamethasone dipropionate) .... Apply two times a day as needed 8)  Spiriva Handihaler 18 Mcg Caps (Tiotropium bromide monohydrate) .... Inhale contents of 1 capsule using handihaler daily. 9)  Advair Diskus 100-50 Mcg/dose Misc (Fluticasone-salmeterol) .Marland Kitchen.. 1 puff two times a day 10)  Singulair 10 Mg Tabs (Montelukast sodium) .Marland Kitchen.. 1 by mouth daily 11)  Diflucan 100 Mg Tabs (Fluconazole) .... Take 1 tablet by mouth once a day x 10days as needed 12)  Hydroxyzine Hcl 25 Mg Tabs (Hydroxyzine hcl) .Marland Kitchen.. 1 - 2 by mouth q 6 hrs as needed for itching 13)  Methylprednisolone 8 Mg Tabs (Methylprednisolone) .... 4 tabs once daily x 3, 3 tabs once daily x 3, 2 tabs once daily x 3, 1 tab once daily x 6 Prescriptions: METHYLPREDNISOLONE 8 MG TABS (METHYLPREDNISOLONE) 4 tabs once daily x 3, 3 tabs once daily x 3, 2 tabs once daily x 3, 1 tab once daily x 6  #33 x 0   Entered and Authorized by:   Jacques Navy MD   Signed by:   Jacques Navy MD on 03/24/2009   Method used:   Print then Give to Patient   RxID:   [id number]

## 2010-11-09 NOTE — Assessment & Plan Note (Signed)
Summary: BRONCHITIS/NWS   Vital Signs:  Patient profile:   65 year old female Height:      68 inches Weight:      232 pounds BMI:     35.40 O2 Sat:      94 % on Room air Temp:     98.8 degrees F oral Pulse rate:   98 / minute BP sitting:   122 / 70  (right arm) Cuff size:   regular  Vitals Entered By: Bill Salinas CMA (November 19, 2009 3:18 PM)  O2 Flow:  Room air CC: Pt here with complaint of tightness in her chest, weakness, fatigue, chills,SOB and coughing x 2 days/ ab   Primary Care Provider:  Chung Chagoya  CC:  Pt here with complaint of tightness in her chest, weakness, fatigue, chills, and SOB and coughing x 2 days/ ab.  History of Present Illness: Patient presents for 24 hour h/o increased respiratory distress with wheezing and shortness of breath with heaviness. No frank chest pain. she admits to fever and rigors. No productive cough. No N/V/D. This is similar to previous episodes of acute asthmatic bronchitis.    Current Medications (verified): 1)  Calcium 600 Mg  Tabs (Calcium) .... 2 Once Daily 2)  Aspirin 325 Mg  Tabs (Aspirin) .Marland Kitchen.. 1 Once Daily` 3)  Lovastatin 40 Mg  Tabs (Lovastatin) .Marland Kitchen.. 1 Once Daily 4)  Albuterol 90 Mcg/act  Aers (Albuterol) .... As Needed 5)  Albuterol Sulfate (2.5 Mg/107ml) 0.083%  Nebu (Albuterol Sulfate) .... Nebulized Qid As Needed 6)  Betamethasone Dipropionate 0.05 % Crea (Betamethasone Dipropionate) .... Apply Two Times A Day As Needed 7)  Spiriva Handihaler 18 Mcg Caps (Tiotropium Bromide Monohydrate) .... Inhale Contents of 1 Capsule Using Handihaler Daily. 8)  Advair Diskus 100-50 Mcg/dose Misc (Fluticasone-Salmeterol) .Marland Kitchen.. 1 Puff Two Times A Day 9)  Singulair 10 Mg Tabs (Montelukast Sodium) .Marland Kitchen.. 1 By Mouth Daily 10)  Phentermine Hcl 37.5 Mg Tabs (Phentermine Hcl) .Marland Kitchen.. 1 By Mouth Q Am Fo Rweight Loss  Allergies (verified): 1)  Sulfa 2)  Codeine 3)  Prednisone  Past History:  Past Medical History: Last updated:  07/16/2008 IBS Hyperlipidemia Allergic rhinitis Pneumonia, hx of intertriginous yeast infection (breasts) COPD/ASthma March  2008, She had an FEV-1 of 60% with FEV-1: FVC ratio  rreduced at 63 and a normal     diffusion capacity back in 2007.  Dr.   Sung Amabile, who was her pulmonary care physician, and was treating         her COPD with asthma with Spiriva/Advair 250/50 and Singulair 10 mg.  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 Valve was never visualized Heart murmur- Aortic Hammer toe deformities Pneumovax - July 2009 RLL nodule - 6mm stable 2006, april 2009 and June 2009. No further followup  Past Surgical History: Last updated: 08/08/2007 Tonsillectomy Insertion of "X-stop" L4-5  Oct '06  [facility]lsner Lumbar laminectomy-with fusion  L4-5 April '07  Elsner Carpal tunnel release-bilateral left Dec '04, right Dec '05   Meyerdierks bunionectomy right foot  G2P2 NSVD PSH reviewed for relevance, FH reviewed for relevance  Review of Systems       The patient complains of fever, hoarseness, chest pain, dyspnea on exertion, and prolonged cough.  The patient denies anorexia, weight loss, weight gain, decreased hearing, syncope, abdominal pain, muscle weakness, difficulty walking, and depression.  Physical Exam  General:  overweight white female in no distress Head:  Normocephalic and atraumatic without obvious abnormalities. No apparent alopecia or balding. Lungs:  no increased work of breathing. Shallow inspiration with prolonged expiratory phase with end expiratory wheezing.  Heart:  normal rate and regular rhythm.     Impression & Recommendations:  Problem # 1:  CHRONIC OBSTRUCTIVE PULMONARY DISEASE, ACUTE EXACERBATION (ICD-491.21) Patient with recurrent asthmatic bronchitis.  Plan - doxycycline 100mg  two times a day x 7           medrol 16mg   two times a day x 2, 12mg  two times a day x 3, 8mg  two times a day x 3, 4mg  two times a day x 3, 4mg  once daily x 6           home neb -alburterol qid           APAP, cough syrup of choice.   Complete Medication List: 1)  Calcium 600 Mg Tabs (Calcium) .... 2 once daily 2)  Aspirin 325 Mg Tabs (Aspirin) .Marland Kitchen.. 1 once daily` 3)  Lovastatin 40 Mg Tabs (Lovastatin) .Marland Kitchen.. 1 once daily 4)  Albuterol 90 Mcg/act Aers (Albuterol) .... As needed 5)  Albuterol Sulfate (2.5 Mg/50ml) 0.083% Nebu (Albuterol sulfate) .... Nebulized qid as needed 6)  Betamethasone Dipropionate 0.05 % Crea (Betamethasone dipropionate) .... Apply two times a day as needed 7)  Spiriva Handihaler 18 Mcg Caps (Tiotropium bromide monohydrate) .... Inhale contents of 1 capsule using handihaler daily. 8)  Advair Diskus 100-50 Mcg/dose Misc (Fluticasone-salmeterol) .Marland Kitchen.. 1 puff two times a day 9)  Singulair 10 Mg Tabs (Montelukast sodium) .Marland Kitchen.. 1 by mouth daily 10)  Phentermine Hcl 37.5 Mg Tabs (Phentermine hcl) .Marland Kitchen.. 1 by mouth q am fo rweight loss 11)  Doxycycline Hyclate 100 Mg Caps (Doxycycline hyclate) .Marland Kitchen.. 1 by mouth two times a day x 7 days fro bronchitis 12)  Methylprednisolone 4 Mg Tabs (Methylprednisolone) .... 4 tabs bid  x 2 days, 3 tabs bid x 3 days, 2 tabs bid x 3 days, 1 tab bid x 3 days , 1 qd  x 6 Prescriptions: METHYLPREDNISOLONE 4 MG TABS (METHYLPREDNISOLONE) 4 tabs bid  x 2 days, 3 tabs bid x 3 days, 2 tabs bid x 3 days, 1 tab bid x 3 days , 1 qd  x 6  #58 x 0   Entered and Authorized by:   Jacques Navy MD   Signed by:   Jacques Navy MD on 11/19/2009   Method used:   Electronically to        [facility]. [address] [phone]* (retail)       [address] [place], [place]  [address]       Ph: [phone]       Fax: [phone]   RxID:   [id number] DOXYCYCLINE HYCLATE 100 MG CAPS (DOXYCYCLINE HYCLATE) 1 by mouth two times a day x 7 days fro bronchitis  #14 x 0   Entered and Authorized by:   Jacques Navy MD   Signed by:   Jacques Navy MD on 11/19/2009   Method used:   Electronically to        [facility]. [address] [phone]* (retail)       [address] [place], [place]  [id number]       Ph: [phone]       Fax: [phone]  RxID:   [id number]

## 2010-11-09 NOTE — Assessment & Plan Note (Signed)
Medications Added ALBUTEROL SULFATE (2.5 MG/3ML) 0.083%  NEBU (ALBUTEROL SULFATE) QID as needed ADVAIR DISKUS 250-50 MCG/DOSE MISC (FLUTICASONE-SALMETEROL)  AUG BETAMETHASONE DIPROPIONATE 0.05 % CREA (AUG BETAMETHASONE DIPROPIONATE)  EFFEXOR XR 75 MG CP24 (VENLAFAXINE HCL)  AVELOX 400 MG  TABS (MOXIFLOXACIN HCL) once daily x 5 days MEDROL (PAK) 4 MG  TABS (METHYLPREDNISOLONE) as directed        Vital Signs:  Patient Profile:   65 Years Old Female Height:     66 inches Weight:      282 pounds Temp:     97.2 degrees F oral Pulse rate:   86 / minute BP sitting:   137 / 65  (left arm) Cuff size:   large  Vitals Entered By: Zackery Barefoot CMA (August 08, 2007 2:11 PM)                 PCP:  Orest Dygert   History of Present Illness: [name] presents today congestion, wheezing, SOB headaches, no fever. Dry non-productive cough.  Current Allergies (reviewed today): SULFA CODEINE PREDNISONE  Past Medical History:    IBS    Asthma    COPD    Hyperlipidemia    Allergic rhinitis  Past Surgical History:    Tonsillectomy    Insertion of "X-stop" L4-5  Oct '06  Elsner    Lumbar laminectomy-with fusion  L4-5 April '07  [facility]lsner    Carpal tunnel release-bilateral left Dec '04, right Dec '05   Meyerdierks    bunionectomy right foot        G2P2 NSVD   Family History:    mother -died Feb 13, 2023  CAD, HTN, DM    fathr-died at 39 lung cancer    neg for  breast, colon cancer  Social History:    married 1966    2 sons, 1 grandchild    retired-[employer]    doing OK   Risk Factors:  Passive smoke exposure:  no Drug use:  no HIV high-risk behavior:  no Caffeine use:  4 drinks per day Alcohol use:  no Exercise:  no  Mammogram History:     Date of Last Mammogram:  07/26/2006    Results:  Normal Bilateral   PAP Smear History:     Date of Last PAP Smear:  01/10/2005    Results:  Normal    Review of Systems       The patient complains of hoarseness and prolonged  cough.  The patient denies anorexia, fever, weight loss, vision loss, decreased hearing, chest pain, syncope, dyspnea on exhertion, peripheral edema, hemoptysis, abdominal pain, melena, hematochezia, severe indigestion/heartburn, hematuria, incontinence, genital sores, muscle weakness, suspicious skin lesions, transient blindness, difficulty walking, depression, unusual weight change, abnormal bleeding, enlarged lymph nodes, angioedema, and breast masses.     Physical Exam  General:     alert, well-developed, well-hydrated, appropriate dress, and cooperative to examination.   Lungs:     diffuse end-expiratory wheezing, no rales.normal respiratory effort, no intercostal retractions, and no accessory muscle use.   Heart:     Normal rate and regular rhythm. S1 and S2 normal without gallop, murmur, click, rub or other extra sounds.    Impression & Recommendations:  Problem # 1:  BRONCHITIS-ACUTE (ICD-466.0)  Her updated medication list for this problem includes:    Spiriva Handihaler 18 Mcg Caps (Tiotropium bromide monohydrate) ..... Once daily    Singulair 10 Mg Tabs (Montelukast sodium) .Marland Kitchen... 1 once daily    Albuterol Sulfate (2.5 Mg/10ml) 0.083% Nebu (  Albuterol sulfate) ..... Qid as needed    Albuterol 90 Mcg/act Aers (Albuterol) .Marland Kitchen... As needed    Advair Diskus 250-50 Mcg/dose Misc (Fluticasone-salmeterol)    Avelox 400 Mg Tabs (Moxifloxacin hcl) ..... Once daily x 5 days  History of the same, here today with early on symptoms.  Plan: avelox 400mg  once daily x 5 days; albuterol HHN; medrol dose pak. continue all other meds.   Problem # 2:  depression will taper off effexor: every other day x 1 wk; every third day x 1 week and then off.  Complete Medication List: 1)  Vitamin E 400 Unit Caps (Vitamin e) .Marland Kitchen.. 1 once daily 2)  Calcium 600 Mg Tabs (Calcium) .Marland Kitchen.. 1 once daily 3)  Effexor 75 Mg Tabs (Venlafaxine hcl) .Marland Kitchen.. 1 once daily 4)  Benicar Hct 40-12.5 Mg Tabs (Olmesartan  medoxomil-hctz) .Marland Kitchen.. 1 once daily 5)  Aspirin 325 Mg Tabs (Aspirin) .Marland Kitchen.. 1 once daily` 6)  Lovastatin 40 Mg Tabs (Lovastatin) .Marland Kitchen.. 1 once daily 7)  Spiriva Handihaler 18 Mcg Caps (Tiotropium bromide monohydrate) .... Once daily 8)  Singulair 10 Mg Tabs (Montelukast sodium) .Marland Kitchen.. 1 once daily 9)  Albuterol Sulfate (2.5 Mg/65ml) 0.083% Nebu (Albuterol sulfate) .... Qid as needed 10)  Albuterol 90 Mcg/act Aers (Albuterol) .... As needed 11)  Advair Diskus 250-50 Mcg/dose Misc (Fluticasone-salmeterol) 12)  Aug Betamethasone Dipropionate 0.05 % Crea (Aug betamethasone dipropionate) 13)  Avelox 400 Mg Tabs (Moxifloxacin hcl) .... Once daily x 5 days 14)  Medrol (pak) 4 Mg Tabs (Methylprednisolone) .... As directed   Patient Instructions: 1)  Please schedule a follow-up appointment as needed. 2)  bronchitis- been there before. Avelox 400mg  daily x 5 days; albuterol hand held nebulizer four times a day; medrol dose pak. 3)  Effexor taper: everyo other day x 1 week; every third day x 1 week and then stop. Let me know if you have any problems    Prescriptions: MEDROL (PAK) 4 MG  TABS (METHYLPREDNISOLONE) as directed  #1 x 1   Entered and Authorized by:   Jacques Navy MD   Signed by:   Jacques Navy MD on 08/08/2007   Method used:   Print then Give to Patient   RxID:   [id number] SINGULAIR 10 MG  TABS (MONTELUKAST SODIUM) 1 once daily  #30 x prn   Entered and Authorized by:   Jacques Navy MD   Signed by:   Jacques Navy MD on 08/08/2007   Method used:   Print then Give to Patient   RxID:   [id number] AVELOX 400 MG  TABS (MOXIFLOXACIN HCL) once daily x 5 days  #5 x 0   Entered and Authorized by:   Jacques Navy MD   Signed by:   Jacques Navy MD on 08/08/2007   Method used:   Print then Give to Patient   RxID:   [id number]  ]

## 2010-11-09 NOTE — Miscellaneous (Signed)
Summary: Orders Update pft charges  Clinical Lists Changes  Orders: Added new Service order of Carbon Monoxide diffusing w/capacity (94720) - Signed Added new Service order of Lung Volumes (94240) - Signed Added new Service order of Spirometry (Pre & Post) (94060) - Signed 

## 2010-11-09 NOTE — Assessment & Plan Note (Signed)
Summary: 3-4 months/apc   Visit Type:  Follow-up Primary Provider/Referring Provider:  Norins  CC:  Pt here for follow-up. Pt states she was treated for bronchitis in 2/11. SYmptoms have resolved.Marland Kitchen  History of Present Illness: Hospital followup 02/04/2008 - 02/08/2008 after 2nd hospitaliation for pneumonia/ae-copd.Marland Kitchen She was sent home on o2, spriiva, advair, and albuterol prn and prolonged solumedrol taper (all to prednisone). In Interim, followup had significant fatigure and deconditioning related dyspnea. Currently feels . No acute symptoms. No other new symptoms. She is now volunteering at [facility] [address].  Started rehab 3 days ago. OVeral dyspnea improved but still has some mild doe.   July 13., 2009 OV: ffollowup CT chest from 04/08/2008; shows clearance of pna from april 2009. REC: continue singular, advair, and spiriva for asthma/copd. Return wtih full pfts  OV 07/16/2008: Feels well. Lost 20# intentially. Attending pulmonary rehab. Unclear if dyspnea better; can do 15 minutes on treadmill.  She does feel stronger overall. No proiblems in interim. Has had flu shot. Wants to know if swine flu shot is available; not yet. PTs - fev1 1.8L/72%, Ratio 67 (73),  TLC 5L/91%, DLCO 20/71%. No BD response.   OV 12/08/2008: Feels well. In interim, had left foot surgery for hammer toes. tHis made her go off pulmonary rehab which she will recommence back in April. SHe is compliant with advair, singulair and spiriva. She has not needed her albuterol in many months. Denies dyspnea, wheeze, pnd, orthopnea, cough, hemoptysis, fever, chills, nausea, vomitting, diarrhea, edema. She is scared of the spring because that is when she gets her exacerbations typically and she does not want to taper her current medicatin regimenov 06/03/2009.  Mild AE-COPD. Rx with prednisone and augmentin  OV 09/15/2009. Followup GOld stage 2 COPD. Since last visit in August has been to [place] on 2 week cruiise. Reports enjoying trip  and grea deal of excitement at a new experience. COPD flared up mildly after return. Took antibiotics for 1 week that ended 10 days ago. Currently, bothered by post nasal drip, tickled in throat and consequent cough.   REC: nasacor, saline nasal spray, continue copd RX  OV 12/24/2009: Followup Gold stage 2 COPD and sinus drainage. Doing well. She feels at baseline. Continuing at rehab mainatenance. Struggling with weight loss. Still has some baseline wheeze. Resorts to albuterol use only occassionally. Compliant with spiriva and low dose advair hfa. Also, feels that sinus dryness gets worse/recurs when she does not use nasal steroids. Otherwise, feels well. No woresning in baseline dyspnea, phlegm. Denies  fever, hemoptysis, epistaxis, hemoptsysis, edema  Current Medications (verified): 1)  Calcium 600 Mg  Tabs (Calcium) .... 2 Once Daily 2)  Aspirin 325 Mg  Tabs (Aspirin) .Marland Kitchen.. 1 Once Daily` 3)  Lovastatin 40 Mg  Tabs (Lovastatin) .Marland Kitchen.. 1 Once Daily 4)  Albuterol 90 Mcg/act  Aers (Albuterol) .... As Needed 5)  Albuterol Sulfate (2.5 Mg/19ml) 0.083%  Nebu (Albuterol Sulfate) .... Nebulized Qid As Needed 6)  Betamethasone Dipropionate 0.05 % Crea (Betamethasone Dipropionate) .... Apply Two Times A Day As Needed 7)  Spiriva Handihaler 18 Mcg Caps (Tiotropium Bromide Monohydrate) .... Inhale Contents of 1 Capsule Using Handihaler Daily. 8)  Advair Diskus 100-50 Mcg/dose Misc (Fluticasone-Salmeterol) .Marland Kitchen.. 1 Puff Two Times A Day 9)  Singulair 10 Mg Tabs (Montelukast Sodium) .Marland Kitchen.. 1 By Mouth Daily 10)  Phentermine Hcl 37.5 Mg Tabs (Phentermine Hcl) .Marland Kitchen.. 1 By Mouth Q Am Fo Rweight Loss 11)  Nasonex 50 Mcg/act Susp (Mometasone Furoate) .... One  Spary Daily  Allergies (verified): 1)  Sulfa 2)  Codeine 3)  Prednisone  Past History:  Family History: Last updated: 08-16-2007 mother -died 01/23/23  CAD, HTN, DM fathr-died at 2 lung cancer neg for  breast, colon cancer  Social History: Last updated:  12/13/2007 married 1966 2 sons, 1 grandchild retired-[employer] doing OK March '09 - financially tough times  Risk Factors: Caffeine Use: 4 (2007-08-16) Exercise: no (August 16, 2007)  Risk Factors: Smoking Status: quit (05/03/2007) Passive Smoke Exposure: no (08/16/07)  Past Medical History: Reviewed history from 07/16/2008 and no changes required. IBS Hyperlipidemia Allergic rhinitis Pneumonia, hx of intertriginous yeast infection (breasts) COPD/ASthma March  2008, She had an FEV-1 of 60% with FEV-1: FVC ratio  rreduced at 38 and a normal     diffusion capacity back in 2007.  Dr.   Sung Amabile, who was her pulmonary care physician, and was treating         her COPD with asthma with Spiriva/Advair 250/50 and Singulair 10 mg.  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 Valve was never visualized Heart murmur- Aortic Hammer toe deformities Pneumovax - July 2009 RLL nodule - 6mm stable 2006, april 2009 and June 2009. No further followup  Past Surgical History: Reviewed history from 2007-08-16 and no changes required. Tonsillectomy Insertion of "X-stop" L4-5  Oct '06  Elsner Lumbar laminectomy-with fusion  L4-5 April '07  Elsner Carpal tunnel release-bilateral left Dec '04, right Dec '05   Meyerdierks bunionectomy right foot  G2P2 NSVD  Family History: Reviewed history from 2007/08/16 and no changes required. mother -died January 23, 2023  CAD, HTN, DM fathr-died at 38 lung cancer neg for  breast, colon cancer  Social History: Reviewed history from 12/13/2007 and no changes required. married 1966 2 sons, 1 grandchild retired-[employer] doing OK March '09 - financially tough times  Review of Systems       The patient complains of nasal congestion/difficulty breathing through nose.  The patient denies shortness of breath with activity, shortness of breath  at rest, productive cough, non-productive cough, coughing up blood, chest pain, irregular heartbeats, acid heartburn, indigestion, loss of appetite, weight change, abdominal pain, difficulty swallowing, sore throat, tooth/dental problems, headaches, sneezing, itching, ear ache, anxiety, depression, hand/feet swelling, joint stiffness or pain, rash, change in color of mucus, and fever.         wheezing  Vital Signs:  Patient profile:   65 year old female Height:      68 inches Weight:      235.25 pounds O2 Sat:      94 % on Room air Pulse rate:   74 / minute BP sitting:   134 / 80  (right arm) Cuff size:   regular  Vitals Entered By: Carron Curie CMA (December 24, 2009 1:33 PM)  O2 Flow:  Room air CC: Pt here for follow-up. Pt states she was treated for bronchitis in 2/11. SYmptoms have resolved. Comments Medications reviewed with patient Carron Curie CMA  December 24, 2009 1:35 PM Daytime phone number verified with patient.    Physical Exam  General:  Obese white female in no distess Head:  no tenderness to percussion over the frontal or maxillary sinuses Eyes:  C&S clear Ears:  R ear normal and L ear normal.   Nose:  left nose little's area is RAW with dried blood Mouth:  no gingival abnormalities.   Neck:  supple and full ROM.   Chest Wall:  no deformities noted Lungs:  Speaks full sentences. No acc muscle use. No distress. No cyanosis. decreased BS bilateral and prolonged exhilation.  Scattered wheeze + Heart:  normal rate and regular rhythm.   Abdomen:  bowel sounds positive; abdomen soft and non-tender without masses, or organomegaly Msk:  no deformity or scoliosis noted with normal posture Pulses:  pulses normal Extremities:  no edema, no ulcers  Neurologic:  CN II-XII grossly intact with normal reflexes, coordination, muscle strength and tone Skin:  turgor normal, color normal, no rashes, and no suspicious lesions.   Cervical Nodes:  no significant  adenopathy Axillary Nodes:  no significant adenopathy Psych:  Oriented X3, memory intact for recent and remote, and normally interactive.     Impression & Recommendations:  Problem # 1:  COUGH (ICD-786.2) Assessment Unchanged stable disease but still with some baseline wheeze  plan continue spiriva dc advair disk start empiric dulera (samples given) rov  2 months she wil call to report progress  Problem # 2:  PULMONARY NODULE (ICD-518.89) Assessment: Comment Only  many nodules on april 2009 ct chest - all stable since 2006.  plan no further followup  Problem # 3:  ALLERGIC RHINITIS (ICD-477.9) Assessment: Unchanged  nose gets dry easily. LEft nostril ulceration resolved but shows extra mucus. Advised to continue nasal steroid and start netti pott. Instructed on netti pot Her updated medication list for this problem includes:    Nasonex 50 Mcg/act Susp (Mometasone furoate) ..... One spary daily  Orders: Est. Patient Level II ([address])  Medications Added to Medication List This Visit: 1)  Dulera 200-5 Mcg/act Aero (Mometasone furo-formoterol fum) .... 2 puffs twice per day 2)  Nasonex 50 Mcg/act Susp (Mometasone furoate) .... One spary daily  Patient Instructions: 1)  stop advair 2)  use dulera high dose 2 puff two times a day - take 2 samples 3)  use netti pot daily or atleast 3-4 times per week 4)  return in 2 months to report progress 5)  we will do spirometry at followup Prescriptions: DULERA 200-5 MCG/ACT AERO (MOMETASONE FURO-FORMOTEROL FUM) 2 puffs twice per day  #1 x 6   Entered and Authorized by:   [name] MD   Signed by:   [name] MD on 12/24/2009   Method used:   Electronically to        [facility]. [address] [phone]* (retail)       [address] [place], [place]  [id number]       Ph: [phone]       Fax: [phone]   RxID:   [id number]

## 2010-11-09 NOTE — Assessment & Plan Note (Signed)
Summary: cough---stc   Vital Signs:  Patient profile:   65 year old female Height:      68 inches Weight:      230 pounds BMI:     35.10 O2 Sat:      95 % on Room air Temp:     97.1 degrees F oral Pulse rate:   95 / minute BP sitting:   156 / 78  (left arm) Cuff size:   regular  Vitals Entered By: Bill Salinas CMA (April 07, 2010 9:36 AM)  O2 Flow:  Room air CC: pt here with c/o chronic cough/ ab Comments Pt has never had Zostavax or Colonoscopy   Primary Care Provider:  Norins  CC:  pt here with c/o chronic cough/ ab.  History of Present Illness: Patient presents for severe nausea that started 24 hrs ago, had chills, no fever documented. No change in bowel habit. No abdominal pain. Has been anorexic for 24 hrs: no food or fluids.  She is having respiratory symptoms: cough - nonproductive, increased SOB, wheezing.  Current Medications (verified): 1)  Calcium 600 Mg  Tabs (Calcium) .... 2 Once Daily 2)  Aspirin 325 Mg  Tabs (Aspirin) .Marland Kitchen.. 1 Once Daily` 3)  Lovastatin 40 Mg  Tabs (Lovastatin) .Marland Kitchen.. 1 Once Daily 4)  Albuterol 90 Mcg/act  Aers (Albuterol) .... As Needed 5)  Albuterol Sulfate (2.5 Mg/86ml) 0.083%  Nebu (Albuterol Sulfate) .... Nebulized Qid As Needed 6)  Betamethasone Dipropionate 0.05 % Crea (Betamethasone Dipropionate) .... Apply Two Times A Day As Needed 7)  Spiriva Handihaler 18 Mcg Caps (Tiotropium Bromide Monohydrate) .... Inhale Contents of 1 Capsule Using Handihaler Daily. 8)  Singulair 10 Mg Tabs (Montelukast Sodium) .Marland Kitchen.. 1 By Mouth Daily 9)  Phentermine Hcl 37.5 Mg Tabs (Phentermine Hcl) .Marland Kitchen.. 1 By Mouth Q Am Fo Rweight Loss 10)  Nasonex 50 Mcg/act Susp (Mometasone Furoate) .... One Spary Daily 11)  Methylprednisolone (Pak) 4 Mg Tabs (Methylprednisolone) .... As Directed 12)  Ventolin Hfa 108 (90 Base) Mcg/act Aers (Albuterol Sulfate) .... 2 Puffs Every 4-6 Hours As Needed  Allergies (verified): 1)  Sulfa 2)  Codeine 3)  Prednisone  Past  History:  Past Medical History: Last updated: 07/16/2008 IBS Hyperlipidemia Allergic rhinitis Pneumonia, hx of intertriginous yeast infection (breasts) COPD/ASthma March  2008, She had an FEV-1 of 60% with FEV-1: FVC ratio  rreduced at 36 and a normal     diffusion capacity back in 2007.  Dr.   Sung Amabile, who was her pulmonary care physician, and was treating         her COPD with asthma with Spiriva/Advair 250/50 and Singulair 10 mg.  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 Valve was never visualized Heart murmur- Aortic Hammer toe deformities Pneumovax - July 2009 RLL nodule - 6mm stable 2006, april 2009 and June 2009. No further followup  Past Surgical History: Last updated: August 22, 2007 Tonsillectomy Insertion of "X-stop" L4-5  Oct '06  Elsner Lumbar laminectomy-with fusion  L4-5 April '07  Elsner Carpal tunnel release-bilateral left Dec '04, right Dec '05   Meyerdierks bunionectomy right foot  G2P2 NSVD  Family History: Last updated: 2007/08/22 mother -died 2023/01/29  CAD, HTN, DM fathr-died at 52 lung cancer neg for  breast, colon cancer  Social History: Last updated: 12/13/2007 married 1966 2 sons, 1 grandchild retired-[employer] doing OK March '  09 - financially tough times  Risk Factors: Caffeine Use: 4 (08/08/2007) Exercise: no (08/08/2007)  Risk Factors: Smoking Status: quit (05/03/2007) Passive Smoke Exposure: no (08/08/2007)  Review of Systems       rigors. Nausea   Physical Exam  General:  overweight white female in no acute distress Head:  Normocephalic and atraumatic without obvious abnormalities. No apparent alopecia or balding. Eyes:  C&S clear Neck:  supple.   Lungs:  normal respiratory effort, no intercostal retractions, and no accessory muscle use.  Diffuse expiratory wheezing with slightly prolonged  expiratory phase. NO rhonchi no rales.  Heart:  reg rate, III/VI systolic murmur heard best at the RSB but at all locations.  Abdomen:  BS+ x 4, soft without guarding or rebound.  No tenderness   Impression & Recommendations:  Problem # 1:  CARDIAC MURMUR, AORTIC (ICD-785.2) Reviewed MAy 9th 2 D echo: mild AS, Mild AR, mild MR.  discussed the meaning of this test and the diagnosis. Plan will be for follow-up study in 1 year.   Problem # 2:  ACUTE BRONCHITIS (ICD-466.0) Recurrent bronchitis with wheezing and cough  Plan - z-pak           medrol dosepak           continue all meds.  Her updated medication list for this problem includes:    Albuterol 90 Mcg/act Aers (Albuterol) .Marland Kitchen... As needed    Albuterol Sulfate (2.5 Mg/60ml) 0.083% Nebu (Albuterol sulfate) ..... Nebulized qid as needed    Spiriva Handihaler 18 Mcg Caps (Tiotropium bromide monohydrate) ..... Inhale contents of 1 capsule using handihaler daily.    Singulair 10 Mg Tabs (Montelukast sodium) .Marland Kitchen... 1 by mouth daily    Ventolin Hfa 108 (90 Base) Mcg/act Aers (Albuterol sulfate) .Marland Kitchen... 2 puffs every 4-6 hours as needed    Azithromycin 250 Mg Tabs (Azithromycin) .Marland Kitchen... 2 tabs day #1, 1 tab day 2-5  Problem # 3:  GASTROENTERITIS, VIRAL, ACUTE (ICD-008.8) No evidence of gastritis or PUD. No diarrhea. Suspect viral gastroenteritis with nausea  Plan - phenergan 12.5-25mg  q 6           hydrate.  Complete Medication List: 1)  Calcium 600 Mg Tabs (Calcium) .... 2 once daily 2)  Aspirin 325 Mg Tabs (Aspirin) .Marland Kitchen.. 1 once daily` 3)  Lovastatin 40 Mg Tabs (Lovastatin) .Marland Kitchen.. 1 once daily 4)  Albuterol 90 Mcg/act Aers (Albuterol) .... As needed 5)  Albuterol Sulfate (2.5 Mg/39ml) 0.083% Nebu (Albuterol sulfate) .... Nebulized qid as needed 6)  Betamethasone Dipropionate 0.05 % Crea (Betamethasone dipropionate) .... Apply two times a day as needed 7)  Spiriva Handihaler 18 Mcg Caps (Tiotropium bromide monohydrate) .... Inhale contents of  1 capsule using handihaler daily. 8)  Singulair 10 Mg Tabs (Montelukast sodium) .Marland Kitchen.. 1 by mouth daily 9)  Phentermine Hcl 37.5 Mg Tabs (Phentermine hcl) .Marland Kitchen.. 1 by mouth q am fo rweight loss 10)  Nasonex 50 Mcg/act Susp (Mometasone furoate) .... One spary daily 11)  Methylprednisolone (pak) 4 Mg Tabs (Methylprednisolone) .... As directed 12)  Ventolin Hfa 108 (90 Base) Mcg/act Aers (Albuterol sulfate) .... 2 puffs every 4-6 hours as needed 13)  Azithromycin 250 Mg Tabs (Azithromycin) .... 2 tabs day #1, 1 tab day 2-5 14)  Promethazine Hcl 25 Mg Tabs (Promethazine hcl) .... 1/2 or 1 by mouth q 6 as needed nausea  Patient Instructions: 1)  Nausea - a viral bug vs gastritis (less likely) Plan - phenergan 25mg  1/2 or 1 tab every  6 hours, clear liquids ( gingerale), otc Zantac or pepcid twice a day. 2)  Bronchitis - same song different verse!! Z-pak, medrol dose pak, continue all your breathing treatments.  Prescriptions: METHYLPREDNISOLONE (PAK) 4 MG TABS (METHYLPREDNISOLONE) as directed  #1 x 1   Entered and Authorized by:   Jacques Navy MD   Signed by:   Jacques Navy MD on 04/07/2010   Method used:   Electronically to        [facility]. [address] [phone]* (retail)       [address] [place], [place]  [id number]       Ph: [phone]       Fax: [phone]   RxID:   [id number] PROMETHAZINE HCL 25 MG TABS (PROMETHAZINE HCL) 1/2 or 1 by mouth q 6 as needed nausea  #30 x 1   Entered and Authorized by:   Jacques Navy MD   Signed by:   Jacques Navy MD on 04/07/2010   Method used:   Electronically to        [facility]. [address] [phone]* (retail)       [address] [place], [place]  [id number]       Ph: [phone]       Fax: [phone]   RxID:   [id number] AZITHROMYCIN 250 MG TABS (AZITHROMYCIN) 2 tabs day #1, 1 tab day 2-5  #6 x 0   Entered and Authorized by:   Jacques Navy MD   Signed by:   Jacques Navy MD on 04/07/2010   Method  used:   Electronically to        [facility]. [address] [phone]* (retail)       [address] [place], [place]  [id number]       Ph: [phone]       Fax: [phone]   RxID:   [id number]

## 2010-11-09 NOTE — Progress Notes (Signed)
Summary: REFILL  Phone Note Call from Patient   Summary of Call: Pt req rf on BETAMETHASONE DP AUG 0.05% CRM Initial call taken by: Lamar Sprinkles,  June 23, 2008 1:37 PM  Follow-up for Phone Call        OKI for refill - large tray size. 2 refills Follow-up by: Jacques Navy MD,  June 23, 2008 4:24 PM    New/Updated Medications: BETAMETHASONE DIPROPIONATE 0.05 % CREA (BETAMETHASONE DIPROPIONATE) apply two times a day as needed   Prescriptions: BETAMETHASONE DIPROPIONATE 0.05 % CREA (BETAMETHASONE DIPROPIONATE) apply two times a day as needed  #50 x 2   Entered by:   Lamar Sprinkles   Authorized by:   Jacques Navy MD   Signed by:   Lamar Sprinkles on 06/23/2008   Method used:   Electronically to        [facility]. [phone]* (retail)       [address] [place], [place]  [address]       Ph: [phone]       Fax: [phone]   RxID:   [id number]

## 2010-11-09 NOTE — Progress Notes (Signed)
Summary: pages being faxed now/ pulm rehab  Phone Note From Other Clinic   Caller: jenny [facility]/ [facility] Call For: [place] Gamero Summary of Call: FYI: jenny has just faxed 2 pages re: rehab (from today's visit). jennie # [id number] Initial call taken by: Tivis Ringer, CNA,  Feb 23, 2010 10:08 AM  Follow-up for Phone Call        this is just an FYI.  will forward to MR so he is aware.  Arman Filter LPN  Feb 23, 2010 10:58 AM   Additional Follow-up for Phone Call Additional follow up Details #1::        ok. pls place in inbox Additional Follow-up by: [name] MD,  Feb 23, 2010 4:35 PM

## 2010-11-09 NOTE — Progress Notes (Signed)
Summary: RF  Phone Note Call from Patient   Summary of Call: Patient is requesting refill on diflucan. This is for "fungus" under her breast.  Initial call taken by: Lamar Sprinkles,  January 26, 2009 5:11 PM  Follow-up for Phone Call        OK - diflucan (generic) 100mg  1 by mouth once daily x 10 days. No refills Follow-up by: Jacques Navy MD,  January 27, 2009 6:18 AM  Additional Follow-up for Phone Call Additional follow up Details #1::        Patient notified Additional Follow-up by: Rock Nephew CMA,  January 27, 2009 1:42 PM    New/Updated Medications: DIFLUCAN 100 MG TABS (FLUCONAZOLE) Take 1 tablet by mouth once a day x 10days   Prescriptions: DIFLUCAN 100 MG TABS (FLUCONAZOLE) Take 1 tablet by mouth once a day x 10days  #10 x 0   Entered by:   Rock Nephew CMA   Authorized by:   Jacques Navy MD   Signed by:   Rock Nephew CMA on 01/27/2009   Method used:   Electronically to        [facility]. [phone]* (retail)       [address] [place], [place]  [address]       Ph: [phone]       Fax: [phone]   RxID:   [id number]

## 2010-11-09 NOTE — Miscellaneous (Signed)
Summary: Pulm Rehab Program/[place]  Pulm Rehab Program/[place]   Imported By: Sherian Rein 03/01/2010 09:15:58  _____________________________________________________________________  External Attachment:    Type:   Image     Comment:   External Document

## 2010-11-09 NOTE — Letter (Signed)
Summary: Pulmonary rehab Referral Form/[facility]  Pulmonary rehab Referral Form/[facility]   Imported By: Lanelle Bal 03/07/2008 09:25:49  _____________________________________________________________________  External Attachment:    Type:   Image     Comment:   External Document

## 2010-11-09 NOTE — Assessment & Plan Note (Signed)
Summary: FOLLOW UP/ MBW   Visit Type:  Follow-up PCP:  Norins  Chief Complaint:  fu after PFT....Marland KitchenMarland Kitchenreviewed meds.  History of Present Illness: Hospital followup 02/04/2008 - 02/08/2008 after 2nd hospitaliation for pneumonia/ae-copd.Marland Kitchen She was sent home on o2, spriiva, advair, and albuterol prn and prolonged solumedrol taper (all to prednisone). In Interim, followup had significant fatigure and deconditioning related dyspnea. Currently feels . No acute symptoms. No other new symptoms. She is now volunteering at [facility] [address].  Started rehab 3 days ago. OVeral dyspnea improved but still has some mild doe.   July 13., 2009 OV: ffollowup CT chest from 04/08/2008; shows clearance of pna from april 2009. REC: continue singular, advair, and spiriva for asthma/copd. Return wtih full pfts  OV 07/16/2008: Feels well. Lost 20# intentially. Attending pulmonary rehab. Unclear if dyspnea better; can do 15 minutes on treadmill.  She does feel stronger overall. No proiblems in interim. Has had flu shot. Wants to know if swine flu shot is available; not yet. PFTs - fev1 1.8L/72%, Ratio 67 (73),  TLC 5L/91%, DLCO 20/71%. No BD response.      Updated Prior Medication List: CALCIUM 600 MG  TABS (CALCIUM) 1 once daily BENICAR HCT 40-12.5 MG  TABS (OLMESARTAN MEDOXOMIL-HCTZ) 1 once daily ASPIRIN 325 MG  TABS (ASPIRIN) 1 once daily` LOVASTATIN 40 MG  TABS (LOVASTATIN) 1 once daily SPIRIVA HANDIHALER 18 MCG  CAPS (TIOTROPIUM BROMIDE MONOHYDRATE) once daily SINGULAIR 10 MG  TABS (MONTELUKAST SODIUM) 1 once daily ALBUTEROL 90 MCG/ACT  AERS (ALBUTEROL) as needed ADVAIR DISKUS 250-50 MCG/DOSE MISC (FLUTICASONE-SALMETEROL) inhale 1 dose by mouth every 12 hours ALBUTEROL SULFATE (2.5 MG/3ML) 0.083%  NEBU (ALBUTEROL SULFATE) nebulized qid as needed BETAMETHASONE DIPROPIONATE 0.05 % CREA (BETAMETHASONE DIPROPIONATE) apply two times a day as needed  Current Allergies (reviewed today): SULFA CODEINE PREDNISONE   Past Medical History:    Reviewed history from 04/24/2008 and no changes required:       IBS       Hyperlipidemia       Allergic rhinitis       Pneumonia, hx of       intertriginous yeast infection (breasts)       COPD/ASthma March  2008, She had an FEV-1 of 60% with FEV-1: FVC ratio  rreduced at 3 and a normal     diffusion capacity back in 2007.  Dr.   Sung Amabile, who was her pulmonary care physician, and was treating         her COPD with asthma with Spiriva/Advair 250/50 and Singulair 10 mg.  October 2009: PFTs - fev1 1.8L/72%, Ratio 67 (73),  TLC 5L/91%, DLCO 20/71%       15 pack smoking history - quit 1976       Ejection systolic murmur - echo 01/2008 shows lvh and likely gradient. Valve was never visualized       Heart murmur- Aortic       Hammer toe deformities       Pneumovax - July 2009       RLL nodule - 6mm stable 2006, april 2009 and June 2009. No further followup   Family History:    Reviewed history from 08/08/2007 and no changes required:       mother -died 02/14/2023  CAD, HTN, DM       fathr-died at 62 lung cancer       neg for  breast, colon cancer  Social History:    Reviewed history from 12/13/2007 and  no changes required:       married 1966       2 sons, 1 grandchild       retired-[employer]       doing OK       March '09 - financially tough times   Risk Factors: Tobacco use:  quit    Year quit:  1976    Pack-years:  20 pk years Passive smoke exposure:  no Drug use:  no HIV high-risk behavior:  no Caffeine use:  4 drinks per day Alcohol use:  no Exercise:  no  Mammogram History:    Date of Last Mammogram:  07/26/2006  PAP Smear History:    Date of Last PAP Smear:  01/10/2005   Review of Systems      See HPI       The patient complains of weight loss.  The patient denies weight gain, chest pain, dyspnea on exertion, peripheral edema, and prolonged cough.     Vital Signs:  Patient Profile:   65 Years Old Female Height:     66 inches Weight:       260 pounds BMI:     42.12 O2 Sat:      96 % O2 treatment:    Room Air Temp:     97.4 degrees F oral Pulse rate:   72 / minute BP sitting:   112 / 70  (left arm) Cuff size:   regular  Pt. in pain?   no  Vitals Entered By: Clarise Cruz Duncan Dull) (July 16, 2008 2:48 PM)                  Physical Exam  General:     Well-developed,well-nourished,in no acute distress; alert,appropriate and cooperative throughout examination Head:     Normocephalic and atraumatic without obvious abnormalities. No apparent alopecia or balding. Some swelling about the face. Eyes:     corneas and lenses clear and no injection.   Ears:     R ear normal and L ear normal.   Nose:     External nasal examination shows no deformity or inflammation. Nasal mucosa are pink and moist without lesions or exudates. Mouth:     good dentition, no gingival abnormalities, pharynx pink and moist, no exudates, and no posterior lymphoid hypertrophy.   Neck:     No deformities, masses, or tenderness noted. Chest Wall:     No deformities, masses, or tenderness noted. Lungs:     Normal respiratory effort, chest expands symmetrically. Lungs are clear to auscultation, no crackles or wheezes. Heart:     RRR, II/VI systolic murmur best at RSB, no diastolic component. Abdomen:     obese Msk:     Left foot, second digit with blister - area clean and dry mild erythema and tenderness upon palpation.  No infection present. Hammer toe deformity noted. Pulses:     R radial normal and L radial normal.   Extremities:     No clubbing, cyanosis, edema, or deformity noted with normal full range of motion of all joints.   Neurologic:     alert & oriented X3, cranial nerves II-XII intact, and strength normal in all extremities.   Skin:     turgor normal, color normal, no rashes, no suspicious lesions, and no ecchymoses.   Cervical Nodes:     no anterior cervical adenopathy and no posterior cervical adenopathy.   Psych:      memory intact for recent and remote, good eye  contact, and not anxious appearing.        Impression & Recommendations:  Problem # 1:  VIRAL PNEUMONIA (ICD-480.9) Assessment: Improved resolved  Problem # 2:  CARDIAC MURMUR, AORTIC (ICD-785.2) Assessment: Unchanged she does not want cardioloy eval at this time. Dr. Debby Bud following Orders: Est. Patient Level II [phone])   Problem # 3:  MORBID OBESITY (ICD-278.01) Assessment: Improved lost 20# intentially  Orders: Est. Patient Level II (78295)   Problem # 4:  SHORTNESS OF BREATH (SOB) (ICD-786.05) Assessment: Improved due to obesity, deconditiinng, asthma/copd, ejection systoloic mumuru (trans-aortic gradient 26 on echo 01/2008)  PLAN Inhalers as prescirbed for asthma/copd continue rehab refer cardiology for murmur workup; she is holding this off. So will monitor this   Orders: Est. Patient Level II (62130)   Problem # 5:  ASTHMA (ICD-493.90) Patient doing well with medication and says she is at her baseline.  Patients lungs clear today.  PFTs show 13% improvemet in Fev1 compared to 2008. Currently fev1 at 73%.   PLAN refill medicine taper advair to 125-50 1 puff two times a day continue singulair at nxt visit we can consider singulair wena continue spiriva Her updated medication list for this problem includes:    Spiriva Handihaler 18 Mcg Caps (Tiotropium bromide monohydrate) ..... Once daily    Singulair 10 Mg Tabs (Montelukast sodium) .Marland Kitchen... 1 once daily    Albuterol 90 Mcg/act Aers (Albuterol) .Marland Kitchen... As needed    Advair Diskus 100-50 Mcg/dose Misc (Fluticasone-salmeterol) ..... One puff twice daily    Albuterol Sulfate (2.5 Mg/1ml) 0.083% Nebu (Albuterol sulfate) ..... Nebulized qid as needed    Singulair 10 Mg Tabs (Montelukast sodium) ..... One by mouth daily    Spiriva Handihaler 18 Mcg Caps (Tiotropium bromide monohydrate) ..... One puffs in handihaler daily  Orders: Est. Patient Level II (86578)    Medications Added to Medication List This Visit: 1)  Advair Diskus 100-50 Mcg/dose Misc (Fluticasone-salmeterol) .... One puff twice daily 2)  Singulair 10 Mg Tabs (Montelukast sodium) .... One by mouth daily 3)  Spiriva Handihaler 18 Mcg Caps (Tiotropium bromide monohydrate) .... One puffs in handihaler daily   Patient Instructions: 1)  I am glad you are better 2)  Continue rehab  3)  Focus daily on good exercise and diet to achieve weight loss 4)  Return for followup in 3 months  5)  Continue your meds; spiriva and singulair as before 6)  Take advair at the lower dose as directed 7)  I advise you to take swine flu vaccine when available    Prescriptions: ALBUTEROL 90 MCG/ACT  AERS (ALBUTEROL) as needed  #1 x 6   Entered and Authorized by:   [name] MD   Signed by:   [name] MD on 07/16/2008   Method used:   Electronically to        [facility]. [phone]* (retail)       [address] [place], [place]  [address]       Ph: [phone]       Fax: [phone]   RxID:   [id number] SPIRIVA HANDIHALER 18 MCG  CAPS (TIOTROPIUM BROMIDE MONOHYDRATE) one puffs in handihaler daily  #30 x 6   Entered and Authorized by:   [name] MD   Signed by:   [name] MD on 07/16/2008   Method used:   Electronically to  [facility]  [place]. [phone]* (retail)       [address] [place], [place]  [address]       Ph: [phone]       Fax: [phone]   RxID:   [id number] SINGULAIR 10 MG  TABS (MONTELUKAST SODIUM) One by mouth daily  #30 x 6   Entered and Authorized by:   [name] MD   Signed by:   [name] MD on 07/16/2008   Method used:   Electronically to        [facility]. [phone]* (retail)       [address] [place], [place]  [address]       Ph: [phone]       Fax: [phone]   RxID:   [id number] ADVAIR DISKUS  100-50 MCG/DOSE  MISC (FLUTICASONE-SALMETEROL) One puff twice daily  #1 x 6   Entered and Authorized by:   [name] MD   Signed by:   [name] MD on 07/16/2008   Method used:   Electronically to        [facility]. [phone]* (retail)       [address] [place], [place]  [address]       Ph: [phone]       Fax: [phone]   RxID:   [id number]  ]

## 2010-11-09 NOTE — Progress Notes (Signed)
----   Converted from flag ---- ---- 01/25/2010 9:00 AM, Edman Circle wrote: appot 5/5 @ 7:30  ---- 01/25/2010 8:00 AM, Dagoberto Reef wrote:   ---- 01/23/2010 4:51 PM, Jacques Navy MD wrote: The following orders have been entered for this patient and placed on Admin Hold:  Type:     Referral       Code:   Cardiology Description:   Cardiology Referral Order Date:   01/22/2010   Authorized By:   Jacques Navy MD Order #:   [id number] Clinical Notes:   f/u 2D echo as recommended by cardiology at her last study 10/09 - following aortic stenosis. ------------------------------

## 2010-11-09 NOTE — Assessment & Plan Note (Signed)
Summary: RASH OVER BODY ???POISON IVY-DR MEN PT-$50--STC   Vital Signs:  Patient profile:   65 year old female Height:      66 inches (167.64 cm) Weight:      245.6 pounds (111.64 kg) O2 Sat:      94 % Temp:     98.7 degrees F (37.06 degrees C) oral Pulse rate:   75 / minute BP sitting:   100 / 60  (left arm) Cuff size:   large  Vitals Entered By: Orlan Leavens (Feb 25, 2009 3:44 PM) CC: RASH ALL OVER HER BODY Is Patient Diabetic? No Pain Assessment Patient in pain? no        Primary Care Provider:  Norins  CC:  RASH ALL OVER HER BODY.  History of Present Illness: here with a rash that seemed to start near the left wrist 3 to 4 days ago, but now seems spreading and involved both arms, upper chest with marked itching, no fever or trauma noted  Problems Prior to Update: 1)  Contact Dermatitis  (ICD-692.9) 2)  Pressure Ulcer Unspecified Site  (ICD-707.00) 3)  Cardiac Murmur, Aortic  (ICD-785.2) 4)  Pulmonary Nodule  (ICD-518.89) 5)  Back Pain  (ICD-724.5) 6)  Morbid Obesity  (ICD-278.01) 7)  Other Candidiasis of Other Specified Sites  (ICD-112.89) 8)  Shortness of Breath (SOB)  (ICD-786.05) 9)  Viral Pneumonia  (ICD-480.9) 10)  Pneumonia, Hx of  (ICD-V12.60) 11)  Acute Bronchitis  (ICD-466.0) 12)  Pneumonia, Left  (ICD-486) 13)  Meralgia Paresthetica  (ICD-355.1) 14)  Allergic Rhinitis  (ICD-477.9) 15)  Gerd  (ICD-530.81) 16)  Hx of Lung Nodule  (ICD-518.89) 17)  Hyperlipidemia  (ICD-272.4) 18)  COPD  (ICD-496) 19)  Asthma  (ICD-493.90)  Medications Prior to Update: 1)  Calcium 600 Mg  Tabs (Calcium) .... 2 Once Daily 2)  Benicar Hct 40-12.5 Mg  Tabs (Olmesartan Medoxomil-Hctz) .Marland Kitchen.. 1 Once Daily 3)  Aspirin 325 Mg  Tabs (Aspirin) .Marland Kitchen.. 1 Once Daily` 4)  Lovastatin 40 Mg  Tabs (Lovastatin) .Marland Kitchen.. 1 Once Daily 5)  Albuterol 90 Mcg/act  Aers (Albuterol) .... As Needed 6)  Albuterol Sulfate (2.5 Mg/10ml) 0.083%  Nebu (Albuterol Sulfate) .... Nebulized Qid As Needed 7)   Betamethasone Dipropionate 0.05 % Crea (Betamethasone Dipropionate) .... Apply Two Times A Day As Needed 8)  Spiriva Handihaler 18 Mcg Caps (Tiotropium Bromide Monohydrate) .... Inhale Contents of 1 Capsule Using Handihaler Daily. 9)  Advair Diskus 100-50 Mcg/dose Misc (Fluticasone-Salmeterol) .Marland Kitchen.. 1 Puff Two Times A Day 10)  Singulair 10 Mg Tabs (Montelukast Sodium) .Marland Kitchen.. 1 By Mouth Daily 11)  Diflucan 100 Mg Tabs (Fluconazole) .... Take 1 Tablet By Mouth Once A Day X 10days  Current Medications (verified): 1)  Calcium 600 Mg  Tabs (Calcium) .... 2 Once Daily 2)  Benicar Hct 40-12.5 Mg  Tabs (Olmesartan Medoxomil-Hctz) .Marland Kitchen.. 1 Once Daily 3)  Aspirin 325 Mg  Tabs (Aspirin) .Marland Kitchen.. 1 Once Daily` 4)  Lovastatin 40 Mg  Tabs (Lovastatin) .Marland Kitchen.. 1 Once Daily 5)  Albuterol 90 Mcg/act  Aers (Albuterol) .... As Needed 6)  Albuterol Sulfate (2.5 Mg/71ml) 0.083%  Nebu (Albuterol Sulfate) .... Nebulized Qid As Needed 7)  Betamethasone Dipropionate 0.05 % Crea (Betamethasone Dipropionate) .... Apply Two Times A Day As Needed 8)  Spiriva Handihaler 18 Mcg Caps (Tiotropium Bromide Monohydrate) .... Inhale Contents of 1 Capsule Using Handihaler Daily. 9)  Advair Diskus 100-50 Mcg/dose Misc (Fluticasone-Salmeterol) .Marland Kitchen.. 1 Puff Two Times A Day 10)  Singulair 10 Mg  Tabs (Montelukast Sodium) .Marland Kitchen.. 1 By Mouth Daily 11)  Diflucan 100 Mg Tabs (Fluconazole) .... Take 1 Tablet By Mouth Once A Day X 10days 12)  Methylprednisolone 4 Mg Tabs (Methylprednisolone) .... 4po Once Daily X3days, 3po Once Daily X3days, 2po Once Daily X3days, 1po Once Daily X3days, Then Stop 13)  Hydroxyzine Hcl 25 Mg Tabs (Hydroxyzine Hcl) .Marland Kitchen.. 1 - 2 By Mouth Q 6 Hrs As Needed For Itching  Allergies (verified): 1)  Sulfa 2)  Codeine 3)  Prednisone  Comments:  Nurse/Medical Assistant: The patient's medications and allergies were reviewed with the patient and were updated in the Medication and Allergy Lists. Orlan Leavens (Feb 25, 2009 3:45 PM)   Past History:  Past Medical History:    IBS    Hyperlipidemia    Allergic rhinitis    Pneumonia, hx of    intertriginous yeast infection (breasts)    COPD/ASthma March  2008, She had an FEV-1 of 60% with FEV-1: FVC ratio  rreduced at 61 and a normal     diffusion capacity back in 2007.  Dr.   Sung Amabile, who was her pulmonary care physician, and was treating         her COPD with asthma with Spiriva/Advair 250/50 and Singulair 10 mg.  October 2009: PFTs - fev1 1.8L/72%, Ratio 67 (73),  TLC 5L/91%, DLCO 20/71%    15 pack smoking history - quit 1976    Ejection systolic murmur - echo 01/2008 shows lvh and likely gradient. Valve was never visualized    Heart murmur- Aortic    Hammer toe deformities    Pneumovax - July 2009    RLL nodule - 6mm stable 2006, april 2009 and June 2009. No further followup     (07/16/2008)  Past Surgical History:    Tonsillectomy    Insertion of "X-stop" L4-5  Oct '06  [facility]    Lumbar laminectomy-with fusion  L4-5 April '07  [facility]    Carpal tunnel release-bilateral left Dec '04, right Dec '05   [facility]    bunionectomy right foot    G2P2 NSVD     (08/08/2007)  Social History:    married 1966    2 sons, 1 grandchild    retired-[employer]    doing OK    March '09 - financially tough times (12/13/2007)  Risk Factors:    Alcohol Use: N/A    >5 drinks/d w/in last 3 months: N/A    Caffeine Use: 4 (08/08/2007)    Diet: N/A    Exercise: no (08/08/2007)  Risk Factors:    Smoking Status: quit (05/03/2007)    Packs/Day: N/A    Cigars/wk: N/A    Pipe Use/wk: N/A    Cans of tobacco/wk: N/A    Passive Smoke Exposure: no (08/08/2007)  Social History:    Reviewed history from 12/13/2007 and no changes required:       married 1966       2 sons, 1 grandchild       retired-[employer]       doing OK       March '09 - financially tough times  Review of Systems       all otherwise negative   Physical Exam  General:  alert and overweight-appearing.   Head:   normocephalic and atraumatic.   Eyes:  vision grossly intact, pupils equal, and pupils round.   Ears:  R ear normal and L ear normal.   Nose:  no external deformity, no nasal discharge, and  no mucosal edema.   Mouth:  no gingival abnormalities.   Neck:  supple and no masses.   Lungs:  normal respiratory effort and normal breath sounds.   Heart:  normal rate and regular rhythm.   Extremities:  no edema, no ulcers  Skin:  linear erythema and other erthem lesions to arms a and upper chest, and right facial area    Impression & Recommendations:  Problem # 1:  CONTACT DERMATITIS (ICD-692.9)  Her updated medication list for this problem includes:    Betamethasone Dipropionate 0.05 % Crea (Betamethasone dipropionate) .Marland Kitchen... Apply two times a day as needed    Methylprednisolone 4 Mg Tabs (Methylprednisolone) .Marland KitchenMarland KitchenMarland KitchenMarland Kitchen 4po once daily x3days, 3po once daily x3days, 2po once daily x3days, 1po once daily x3days, then stop refill med, as well as depomedrol 12- mg IM today, and medrol dose pack for home, atarax as needed for itching  Orders: Depo- Medrol 80mg  (J1040) Depo- Medrol 40mg  (J1030) Admin of Therapeutic Inj  intramuscular or subcutaneous (25956)  Complete Medication List: 1)  Calcium 600 Mg Tabs (Calcium) .... 2 once daily 2)  Benicar Hct 40-12.5 Mg Tabs (Olmesartan medoxomil-hctz) .Marland Kitchen.. 1 once daily 3)  Aspirin 325 Mg Tabs (Aspirin) .Marland Kitchen.. 1 once daily` 4)  Lovastatin 40 Mg Tabs (Lovastatin) .Marland Kitchen.. 1 once daily 5)  Albuterol 90 Mcg/act Aers (Albuterol) .... As needed 6)  Albuterol Sulfate (2.5 Mg/91ml) 0.083% Nebu (Albuterol sulfate) .... Nebulized qid as needed 7)  Betamethasone Dipropionate 0.05 % Crea (Betamethasone dipropionate) .... Apply two times a day as needed 8)  Spiriva Handihaler 18 Mcg Caps (Tiotropium bromide monohydrate) .... Inhale contents of 1 capsule using handihaler daily. 9)  Advair Diskus 100-50 Mcg/dose Misc (Fluticasone-salmeterol) .Marland Kitchen.. 1 puff two times a day 10)   Singulair 10 Mg Tabs (Montelukast sodium) .Marland Kitchen.. 1 by mouth daily 11)  Diflucan 100 Mg Tabs (Fluconazole) .... Take 1 tablet by mouth once a day x 10days 12)  Methylprednisolone 4 Mg Tabs (Methylprednisolone) .... 4po once daily x3days, 3po once daily x3days, 2po once daily x3days, 1po once daily x3days, then stop 13)  Hydroxyzine Hcl 25 Mg Tabs (Hydroxyzine hcl) .Marland Kitchen.. 1 - 2 by mouth q 6 hrs as needed for itching  Patient Instructions: 1)  you had the steroid shot today 2)  Please take all new medications as prescribed - the medrol, and generic atarax for itching 3)  Continue all medications that you may have been taking previously  4)  Please schedule an appointment with your primary doctor as needed Prescriptions: HYDROXYZINE HCL 25 MG TABS (HYDROXYZINE HCL) 1 - 2 by mouth q 6 hrs as needed for itching  #60 x 1   Entered and Authorized by:   Corwin Levins MD   Signed by:   Corwin Levins MD on 02/25/2009   Method used:   Print then Give to Patient   RxID:   [id number] METHYLPREDNISOLONE 4 MG TABS (METHYLPREDNISOLONE) 4po once daily x3days, 3po once daily x3days, 2po once daily x3days, 1po once daily x3days, then stop  #30 x 0   Entered and Authorized by:   Corwin Levins MD   Signed by:   Corwin Levins MD on 02/25/2009   Method used:   Print then Give to Patient   RxID:   [id number] BETAMETHASONE DIPROPIONATE 0.05 % CREA (BETAMETHASONE DIPROPIONATE) apply two times a day as needed  #50 x 0   Entered and Authorized by:   Corwin Levins MD   Signed by:  Corwin Levins MD on 02/25/2009   Method used:   Print then Give to Patient   RxID:   [id number]    Medication Administration  Injection # 1:    Medication: Depo- Medrol 80mg     Diagnosis: CONTACT DERMATITIS (ICD-692.9)    Route: IM    Site: RUOQ gluteus    Exp Date: 02/2010    Lot #: [id number]    Mfr: teva    Patient tolerated injection without complications    Given by: Orlan Leavens (Feb 25, 2009 4:14 PM)  Injection # 2:     Medication: Depo- Medrol 40mg     Diagnosis: CONTACT DERMATITIS (ICD-692.9)    Route: IM    Site: RUOQ gluteus    Exp Date: 02/2010    Lot #: [id number]    Mfr: teva  Orders Added: 1)  Depo- Medrol 80mg  [J1040] 2)  Depo- Medrol 40mg  [J1030] 3)  Admin of Therapeutic Inj  intramuscular or subcutaneous [96372] 4)  Est. Patient Level III [84696]

## 2010-11-09 NOTE — Assessment & Plan Note (Signed)
Summary: hfu/ mbw   Visit Type:  Follow-up PCP:  Norins  Chief Complaint:  Pt here for hospital follow-up. She is c/o sob with exertion and fatigue.Marland Kitchen  History of Present Illness: Hospital followup 02/04/2008 - 02/08/2008 after 2nd hospitaliation for pneumonia/ae-copd. I was involved in the consult and recommendation. She was sent home on o2, spriiva, advair, and albuterol prn. She finished solumedrol taper yesterday. Feels improvedb but very fatigued and dyspneic with exertion. No acute symptoms. No other symptoms   Serial Vital Signs/Assessments:  Comments: Ambulatory Pulse Oximetry  Resting; HR_86____    02 Sat_96____  Lap1 (185 feet)   HR_112____   02 Sat__96___ Lap2 (185 feet)   HR_122____   02 Sat_95____    Lap3 (185 feet)   HR_126____   02 Sat_96____  x___Test Completed without Difficulty ___Test Stopped due to: Clarise Cruz Duncan Dull)  Feb 28, 2008 11:18 AM   By: Clarise Cruz Duncan Dull)      Updated Prior Medication List: VITAMIN E 400 UNIT  CAPS (VITAMIN E) 2 TABS ONCE DAILY CALCIUM 600 MG  TABS (CALCIUM) 1 once daily BENICAR HCT 40-12.5 MG  TABS (OLMESARTAN MEDOXOMIL-HCTZ) 1 once daily ASPIRIN 325 MG  TABS (ASPIRIN) 1 once daily` LOVASTATIN 40 MG  TABS (LOVASTATIN) 1 once daily SPIRIVA HANDIHALER 18 MCG  CAPS (TIOTROPIUM BROMIDE MONOHYDRATE) once daily SINGULAIR 10 MG  TABS (MONTELUKAST SODIUM) 1 once daily ALBUTEROL 90 MCG/ACT  AERS (ALBUTEROL) as needed ADVAIR DISKUS 250-50 MCG/DOSE MISC (FLUTICASONE-SALMETEROL) inhale 1 dose by mouth every 12 hours AUG BETAMETHASONE DIPROPIONATE 0.05 % CREA (AUG BETAMETHASONE DIPROPIONATE)  NYSTATIN 100000 UNIT/GM  POWD (NYSTATIN) apply to area of rash two times a day ALBUTEROL SULFATE (2.5 MG/3ML) 0.083%  NEBU (ALBUTEROL SULFATE) nebulized qid as needed  Current Allergies (reviewed today): SULFA CODEINE PREDNISONE  Past Medical History:    Reviewed history from 01/26/2008 and no changes required:       IBS        Asthma       COPD       Hyperlipidemia       Allergic rhinitis       Pneumonia, hx of   Family History:    Reviewed history from 08/08/2007 and no changes required:       mother -died 02-03-2023  CAD, HTN, DM       fathr-died at 34 lung cancer       neg for  breast, colon cancer  Social History:    Reviewed history from 12/13/2007 and no changes required:       married 1966       2 sons, 1 grandchild       retired-[employer]       doing OK       March '09 - financially tough times   Risk Factors: Tobacco use:  quit    Year quit:  1976    Pack-years:  20 pk years Passive smoke exposure:  no Drug use:  no HIV high-risk behavior:  no Caffeine use:  4 drinks per day Alcohol use:  no Exercise:  no  Mammogram History:    Date of Last Mammogram:  07/26/2006  PAP Smear History:    Date of Last PAP Smear:  01/10/2005   Review of Systems      See HPI   Vital Signs:  Patient Profile:   65 Years Old Female Height:     66 inches Weight:      282.6 pounds O2 Sat:  97 % O2 treatment:    Room Air Temp:     97.9 degrees F oral Pulse rate:   69 / minute BP sitting:   110 / 60  (right arm) Cuff size:   large  Vitals Entered By: Michel Bickers CMA (Feb 28, 2008 10:56 AM)             Comments Meds reviewed. Michel Bickers CMA  Feb 28, 2008 10:58 AM     Physical Exam  General:     well-nourished, well-hydrated, and overweight-appearing.   Head:     Normocephalic and atraumatic without obvious abnormalities. No apparent alopecia or balding. Eyes:     pupils reactive to light, corneas and lenses clear, and no injection.   Ears:     R ear normal and L ear normal.   Nose:     External nasal examination shows no deformity or inflammation. Nasal mucosa are pink and moist without lesions or exudates. Mouth:     good dentition, no gingival abnormalities, pharynx pink and moist, no exudates, and no posterior lymphoid hypertrophy.   Neck:     No deformities, masses, or  tenderness noted. Chest Wall:     No deformities, masses, or tenderness noted. Lungs:     clear bilaterally to auscultation and percussion Heart:     normal rate and regular rhythm.   Abdomen:     obese Msk:     normal ROM, no joint tenderness, no joint swelling, and no joint warmth.   Pulses:     R and L carotid,radial,femoral,dorsalis pedis and posterior tibial pulses are full and equal bilaterally Extremities:     No clubbing, cyanosis, edema, or deformity noted with normal full range of motion of all joints.   Neurologic:     CN II-XII grossly intact with normal reflexes, coordination, muscle strength and tone Skin:     turgor normal, color normal, no rashes, no suspicious lesions, and no ecchymoses.   Cervical Nodes:     no anterior cervical adenopathy and no posterior cervical adenopathy.   Psych:     memory intact for recent and remote, good eye contact, and not anxious appearing.       Impression & Recommendations:  Problem # 1:  VIRAL PNEUMONIA (ICD-480.9) Assessment: Improved Improved but still very fatigued. Did not desature with exertion but became tacy - c/w deconditioning.   PLAN Cont copd/asthma inhalers No need for o2 anymore can return to volunteering refer pulmonary rehba - very important -rehab did not accept inpatient referral repeat ct chest - end june 2009 Orders: Est. Patient Level III (04540) Radiology Referral (Radiology) Rehabilitation Referral (Rehab)   Problem # 2:  SHORTNESS OF BREATH (SOB) (ICD-786.05) Assessment: Unchanged due to obesity, deconditiinng, asthma/copd, ejection systoloic mumuru (trans-aortic gradient 26 on echo 01/2008)  PLAN Inhalers as prescirbed for asthma/copd rehab mumuru followup by Dr. Debby Bud Orders: Est. Patient Level III (98119) Rehabilitation Referral (Rehab)    Patient Instructions: 1)  attend pulmonary rehab 2)  change ct chest to end june 2009  3)  followup after ct chest    ]

## 2010-11-09 NOTE — Progress Notes (Signed)
Summary: ECHO  Phone Note Call from Patient   Summary of Call: Pt wants to know when repeat echo is due. Report in EMR shows last echo 07/2008 to f/u in one year.  Initial call taken by: Lamar Sprinkles, CMA,  January 22, 2010 11:19 AM  Follow-up for Phone Call        Will schedule 2D echo - appreciate her keeping track of this. [facility] notified Follow-up by: Jacques Navy MD,  January 23, 2010 4:50 PM

## 2010-11-09 NOTE — Assessment & Plan Note (Signed)
Summary: toe problem SD   Vital Signs:  Patient Profile:   65 Years Old Female Height:     66 inches Weight:      274 pounds Temp:     97.9 degrees F oral Pulse rate:   74 / minute BP sitting:   138 / 64  (left arm) Cuff size:   regular  Vitals Entered By: Zackery Barefoot CMA (April 24, 2008 9:02 AM)             Is Patient Diabetic? No     PCP:  Aviyah Swetz  Chief Complaint:  Blister on 2nd toe on left foot x 5 days with pain.  History of Present Illness: [name], a 65 year old patient, presents to the office today for a blister on her left foot second toe.  Patient also wanting to discuss cardiology referral for newly diagnosed heart murmur.    Current Allergies: SULFA CODEINE PREDNISONE  Past Medical History:    Reviewed history from 04/21/2008 and no changes required:       IBS       Hyperlipidemia       Allergic rhinitis       Pneumonia, hx of       intertriginous yeast infection (breasts)       COPD/ASthma March  2008, She had an FEV-1 of 60% with FEV-1: FVC ratio  rreduced at 53 and a normal     diffusion capacity back in 2007.  Dr.   Sung Amabile, who was her pulmonary care physician, and was treating         her COPD with asthma with Spiriva/Advair 250/50 and Singulair 10 mg.        15 pack smoking history - quit 1976       Ejection systolic murmur - echo 01/2008 shows lvh and likely gradient. Valve was never visualized       Heart murmur- Aortic       Hammer toe deformities  Past Surgical History:    Reviewed history from 08/08/2007 and no changes required:       Tonsillectomy       Insertion of "X-stop" L4-5  Oct '06  [facility]       Lumbar laminectomy-with fusion  L4-5 April '07  [facility]       Carpal tunnel release-bilateral left Dec '04, right Dec '05   [facility]       bunionectomy right foot              G2P2 NSVD   Family History:    Reviewed history from 08/08/2007 and no changes required:       mother -died 02-13-2023  CAD, HTN, DM       fathr-died  at 20 lung cancer       neg for  breast, colon cancer  Social History:    Reviewed history from 12/13/2007 and no changes required:       married 1966       2 sons, 1 grandchild       retired-[employer]       doing OK       March '09 - financially tough times    Review of Systems  The patient denies anorexia, fever, weight loss, weight gain, vision loss, decreased hearing, hoarseness, chest pain, syncope, dyspnea on exertion, peripheral edema, prolonged cough, headaches, hemoptysis, abdominal pain, melena, hematochezia, severe indigestion/heartburn, hematuria, incontinence, genital sores, muscle weakness, suspicious skin lesions, transient blindness, difficulty walking, depression, unusual weight  change, abnormal bleeding, enlarged lymph nodes, angioedema, and breast masses.     Physical Exam  General:     Well-developed,well-nourished,in no acute distress; alert,appropriate and cooperative throughout examination Lungs:     Normal respiratory effort, chest expands symmetrically. Lungs are clear to auscultation, no crackles or wheezes. Heart:     RRR, II/VI systolic murmur best at RSB, no diastolic component. Msk:     Left foot, second digit with blister - area clean and dry mild erythema and tenderness upon palpation.  No infection present. Hammer toe deformity noted. Pulses:     R radial normal and L radial normal.      Impression & Recommendations:  Problem # 1:  Toe Blister Assessment: New Patient wore dress shoes last week for an extended period of time and rubbed a blister on her left foot, second toe.  Patient has been cleaning toe with peroxide and applying neosporin to are.  The toe is not infected but is mildly erythemic and tender to touch.  Plan:  1.  Patient has a hammer toe and needs to use cushioning or have a shoe with a large toe box when wearing closed shoes. 2. Soak toe in betadine solution at home and keep clean and dry. 3. Call office if no better  Problem #  2:  CARDIAC MURMUR, AORTIC (ICD-785.2) Assessment: Unchanged Patient advised by Dr. Marchelle Gearing  recently that she had a heart murmur.  Patient had an echo at the end of April which did show a pressure gradient across the aortic valve.  Murmur is present and can be heard best at the right sternal border on auscultation.  Plan: 1. repeat echocardiogram in 1 year to re-assess 2. No cardiology referral at this time  Problem # 3:  ASTHMA (ICD-493.90) Assessment: Improved Patient doing well with medication and says she is at her baseline.  Patients lungs clear today.  Patient is being managed by Dr. Aida Raider for respiratory issues.  Her updated medication list for this problem includes:    Spiriva Handihaler 18 Mcg Caps (Tiotropium bromide monohydrate) ..... Once daily    Singulair 10 Mg Tabs (Montelukast sodium) .Marland Kitchen... 1 once daily    Albuterol 90 Mcg/act Aers (Albuterol) .Marland Kitchen... As needed    Advair Diskus 250-50 Mcg/dose Misc (Fluticasone-salmeterol) ..... Inhale 1 dose by mouth every 12 hours    Albuterol Sulfate (2.5 Mg/85ml) 0.083% Nebu (Albuterol sulfate) ..... Nebulized qid as needed  Plan:  Continue current medication regimen   Complete Medication List: 1)  Vitamin E 400 Unit Caps (Vitamin e) .... 2 tabs once daily 2)  Calcium 600 Mg Tabs (Calcium) .Marland Kitchen.. 1 once daily 3)  Benicar Hct 40-12.5 Mg Tabs (Olmesartan medoxomil-hctz) .Marland Kitchen.. 1 once daily 4)  Aspirin 325 Mg Tabs (Aspirin) .Marland Kitchen.. 1 once daily` 5)  Lovastatin 40 Mg Tabs (Lovastatin) .Marland Kitchen.. 1 once daily 6)  Spiriva Handihaler 18 Mcg Caps (Tiotropium bromide monohydrate) .... Once daily 7)  Singulair 10 Mg Tabs (Montelukast sodium) .Marland Kitchen.. 1 once daily 8)  Albuterol 90 Mcg/act Aers (Albuterol) .... As needed 9)  Advair Diskus 250-50 Mcg/dose Misc (Fluticasone-salmeterol) .... Inhale 1 dose by mouth every 12 hours 10)  Albuterol Sulfate (2.5 Mg/46ml) 0.083% Nebu (Albuterol sulfate) .... Nebulized qid as needed 11)  Phentermine Hcl 37.5 Mg Caps  (Phentermine hcl) .Marland Kitchen.. 1 by mouth q am    ]

## 2010-11-09 NOTE — Miscellaneous (Signed)
Summary: med update  Medications Added MEDROL 4 MG TABS (METHYLPREDNISOLONE) take 16mg  every 8 hrs x 2 days then, take 8mg  every 8 hrs x 2 days then, take 4mg  every 8hrs x 2days then, take 2 mg every 8 hours x 2 days and then stop.       Clinical Lists Changes  Medications: Added new medication of MEDROL 4 MG TABS (METHYLPREDNISOLONE) take 16mg  every 8 hrs x 2 days then, take 8mg  every 8 hrs x 2 days then, take 4mg  every 8hrs x 2days then, take 2 mg every 8 hours x 2 days and then stop.

## 2010-11-09 NOTE — Miscellaneous (Signed)
Summary: Pulmo Maint Program/[facility]  Pulmo Maint Program/[facility]   Imported By: Lester Homecroft 03/31/2009 10:26:28  _____________________________________________________________________  External Attachment:    Type:   Image     Comment:   External Document

## 2010-11-09 NOTE — Assessment & Plan Note (Signed)
Summary: fu after ct////kp   Visit Type:  Follow-up PCP:  Norins  Chief Complaint:  fu visit.......Diane Kitchenreviewed meds........  History of Present Illness: Hospital followup 02/04/2008 - 02/08/2008 after 2nd hospitaliation for pneumonia/ae-copd.[name] She was sent home on o2, spriiva, advair, and albuterol prn and prolonged solumedrol taper (all to prednisone). In Interim, followup had significant fatigure and deconditioning related dyspnea. Currently feels . No acute symptoms. No other new symptoms. She is now volunteering at [facility] [address].  Started rehab 3 days ago. OVeral dyspnea improved but still has some mild doe.   Here today to review followup CT chest from 04/08/2008 (clearance of pna)     Updated Prior Medication List: VITAMIN E 400 UNIT  CAPS (VITAMIN E) 2 TABS ONCE DAILY CALCIUM 600 MG  TABS (CALCIUM) 1 once daily BENICAR HCT 40-12.5 MG  TABS (OLMESARTAN MEDOXOMIL-HCTZ) 1 once daily ASPIRIN 325 MG  TABS (ASPIRIN) 1 once daily` LOVASTATIN 40 MG  TABS (LOVASTATIN) 1 once daily SPIRIVA HANDIHALER 18 MCG  CAPS (TIOTROPIUM BROMIDE MONOHYDRATE) once daily SINGULAIR 10 MG  TABS (MONTELUKAST SODIUM) 1 once daily ALBUTEROL 90 MCG/ACT  AERS (ALBUTEROL) as needed ADVAIR DISKUS 250-50 MCG/DOSE MISC (FLUTICASONE-SALMETEROL) inhale 1 dose by mouth every 12 hours ALBUTEROL SULFATE (2.5 MG/3ML) 0.083%  NEBU (ALBUTEROL SULFATE) nebulized qid as needed PHENTERMINE HCL 37.5 MG  CAPS (PHENTERMINE HCL) 1 by mouth q AM  Current Allergies (reviewed today): SULFA CODEINE PREDNISONE  Past Medical History:    IBS    Hyperlipidemia    Allergic rhinitis    Pneumonia, hx of    intertriginous yeast infection (breasts)    COPD/ASthmaMarch  2008, Sshe had an FEV-1 of 60% with FEV-1: FVC ratio  rreduced at 57 and a normal diffusion capacity back in 2007.  Dr.   Sung Amabile, who was her pulmonary care physician, and was treating her          COPD with asthma with Spiriva/Advair 250/50 and Singulair 10 mg.      15 pack smoking history - quit 1976    Ejection systolic murmur - echo 01/2008 shows lvh and likely gradient. Valve was never visualized   Family History:    Reviewed history from 08/08/2007 and no changes required:       mother -died 2023/02/05  CAD, HTN, DM       fathr-died at 69 lung cancer       neg for  breast, colon cancer  Social History:    Reviewed history from 12/13/2007 and no changes required:       married 1966       2 sons, 1 grandchild       retired-[employer]       doing OK       March '09 - financially tough times   Risk Factors: Tobacco use:  quit    Year quit:  1976    Pack-years:  20 pk years Passive smoke exposure:  no Drug use:  no HIV high-risk behavior:  no Caffeine use:  4 drinks per day Alcohol use:  no Exercise:  no  Mammogram History:    Date of Last Mammogram:  07/26/2006  PAP Smear History:    Date of Last PAP Smear:  01/10/2005   Review of Systems      See HPI   Vital Signs:  Patient Profile:   65 Years Old Female Height:     66 inches Weight:      279.2 pounds BMI:  45.23 O2 Sat:      94 % O2 treatment:    Room Air Temp:     98.1 degrees F oral Pulse rate:   82 / minute BP sitting:   110 / 60  (left arm) Cuff size:   regular  Pt. in pain?   no  Vitals Entered By: Clarise Cruz Duncan Dull) (April 21, 2008 1:34 PM)                  Physical Exam  General:     alert, well-developed, well-nourished, well-hydrated, and appropriate dress.   Head:     Normocephalic and atraumatic without obvious abnormalities. No apparent alopecia or balding. Some swelling about the face. Eyes:     corneas and lenses clear and no injection.   Ears:     R ear normal and L ear normal.   Nose:     External nasal examination shows no deformity or inflammation. Nasal mucosa are pink and moist without lesions or exudates. Mouth:     good dentition, no gingival abnormalities, pharynx pink and moist, no exudates, and no posterior lymphoid  hypertrophy.   Neck:     No deformities, masses, or tenderness noted. Chest Wall:     No deformities, masses, or tenderness noted. Lungs:     normal respiratory effort, no intercostal retractions, no accessory muscle use, normal breath sounds, no dullness, and no wheezes.   Heart:     normal rate and regular rhythm.   Abdomen:     obese Msk:     back exam: nl stand, flex, nl toe/heel walk, step-up, SLR sitting. No CVAT. Pulses:     R and L carotid,radial,femoral,dorsalis pedis and posterior tibial pulses are full and equal bilaterally Extremities:     No clubbing, cyanosis, edema, or deformity noted with normal full range of motion of all joints.   Neurologic:     alert & oriented X3, cranial nerves II-XII intact, and strength normal in all extremities.   Skin:     turgor normal, color normal, no rashes, no suspicious lesions, and no ecchymoses.   Cervical Nodes:     no anterior cervical adenopathy and no posterior cervical adenopathy.   Psych:     memory intact for recent and remote, good eye contact, and not anxious appearing.     CT of Chest  Procedure date:  04/08/2008  Findings:      Personally reviewed and compared to April 2009  Marked interval improvement/resolution of the diffuse patchy and   tree in bud opacities seen involving all lobes of both lungs.    Two small spiculated areas of density persist in the right apex,   stable since the prior study.  These are also unchanged when   comparing back to 12/11/2004, compatible with scarring.    Focal opacity in the lingula is unchanged since 12/11/2004,   consistent with scar.    Stable 6 - 7 mm pulmonary nodule the right lung base since   12/11/2004, consistent with a benign process.    Read By:  Kennith Center,  [name]     Impression & Recommendations:  Problem # 1:  VIRAL PNEUMONIA (ICD-480.9) Assessment: Improved REsolved on CT chest end June 2009 compared to end April 2009.   PLAN Cont copd/asthma  inhalers   Problem # 2:  SHORTNESS OF BREATH (SOB) (ICD-786.05) Assessment: Improved due to obesity, deconditiinng, asthma/copd, ejection systoloic mumuru (trans-aortic gradient 26 on echo 01/2008)  PLAN Get full pft's  to document severity and then at folowup try weaning inhalers if possible Inhalers as prescirbed for asthma/copd continue rehab refer cardiology for murmur workup   Orders: Est. 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  plan no further followup Orders: Est. Patient Level III (06237)   Other Orders: Cardiology Referral (Cardiology)   Patient Instructions: 1)  I am glad you are better 2)  Continue rehab  3)  Focus daily on good exercise and diet to achieve weight loss 4)  Please visit with cardiology regarding murmu 5)  Have PFTs in 3 months 6)  Return for followup in 3 months (At beginning of fall season) 7)  Continue your meds 8)  Pneumovax today   ]  Appended Document: fu after ct////kp     Vitals Entered By: Clarise Cruz Duncan Dull) (April 21, 2008 2:33 PM)                   Current Allergies: SULFA CODEINE PREDNISONE          ]   Pneumovax Vaccine    Vaccine Type: Pneumovax    Site: right deltoid    Mfr: Merck    Dose: 0.5 ml    Route: IM    Given by: Clarise Cruz (AAMA)    Exp. Date: 02/22/2009    Lot #: [id number]    VIS given: 05/07/96 version given April 21, 2008.

## 2010-11-16 ENCOUNTER — Ambulatory Visit (HOSPITAL_COMMUNITY): Payer: Self-pay

## 2010-11-18 ENCOUNTER — Ambulatory Visit (HOSPITAL_COMMUNITY): Payer: Self-pay

## 2010-11-18 ENCOUNTER — Other Ambulatory Visit: Payer: Self-pay | Admitting: Internal Medicine

## 2010-11-18 DIAGNOSIS — Z1231 Encounter for screening mammogram for malignant neoplasm of breast: Secondary | ICD-10-CM

## 2010-11-23 ENCOUNTER — Ambulatory Visit (HOSPITAL_COMMUNITY): Payer: Self-pay

## 2010-11-25 ENCOUNTER — Ambulatory Visit (HOSPITAL_COMMUNITY): Payer: Self-pay

## 2010-11-29 ENCOUNTER — Telehealth (INDEPENDENT_AMBULATORY_CARE_PROVIDER_SITE_OTHER): Payer: Self-pay | Admitting: *Deleted

## 2010-11-30 ENCOUNTER — Ambulatory Visit (HOSPITAL_COMMUNITY): Payer: Self-pay

## 2010-12-02 ENCOUNTER — Ambulatory Visit (HOSPITAL_COMMUNITY): Payer: Self-pay

## 2010-12-07 ENCOUNTER — Ambulatory Visit (HOSPITAL_COMMUNITY): Payer: Self-pay

## 2010-12-07 NOTE — Progress Notes (Signed)
Summary: spiriva refill > already sent 2.17.12 - pt aware  Phone Note Call from Patient Call back at Home Phone [phone]   Caller: Patient Call For: ramaswamy Reason for Call: Refill Medication Summary of Call: Pt states that pharmacy still hasn't received rx for her spiriva.[facility] Initial call taken by: Darletta Moll,  November 29, 2010 8:31 AM  Follow-up for Phone Call        per pt chart, spiriva refills [facility] [address] [facility]12.  called [facility], spoke with elaine who states that this med has already been filled and she left a message w/ pt's husband this morning to inform that her refills are ready.  LMOM TCB for pt x1. Boone Master CNA/MA  November 29, 2010 11:03 AM   Additional Follow-up for Phone Call Additional follow up Details #1::        Pt has refills and has already picked up her medication. Additional Follow-up by: Michel Bickers CMA,  November 29, 2010 5:24 PM

## 2010-12-09 ENCOUNTER — Ambulatory Visit (HOSPITAL_COMMUNITY): Payer: Self-pay

## 2010-12-14 ENCOUNTER — Ambulatory Visit (HOSPITAL_COMMUNITY): Payer: Self-pay

## 2010-12-14 ENCOUNTER — Encounter (HOSPITAL_COMMUNITY): Payer: Medicare Other | Attending: Internal Medicine

## 2010-12-14 DIAGNOSIS — K219 Gastro-esophageal reflux disease without esophagitis: Secondary | ICD-10-CM | POA: Insufficient documentation

## 2010-12-14 DIAGNOSIS — R011 Cardiac murmur, unspecified: Secondary | ICD-10-CM | POA: Insufficient documentation

## 2010-12-14 DIAGNOSIS — R0602 Shortness of breath: Secondary | ICD-10-CM | POA: Insufficient documentation

## 2010-12-14 DIAGNOSIS — I10 Essential (primary) hypertension: Secondary | ICD-10-CM | POA: Insufficient documentation

## 2010-12-14 DIAGNOSIS — J984 Other disorders of lung: Secondary | ICD-10-CM | POA: Insufficient documentation

## 2010-12-14 DIAGNOSIS — Z87891 Personal history of nicotine dependence: Secondary | ICD-10-CM | POA: Insufficient documentation

## 2010-12-14 DIAGNOSIS — J449 Chronic obstructive pulmonary disease, unspecified: Secondary | ICD-10-CM | POA: Insufficient documentation

## 2010-12-14 DIAGNOSIS — Z8701 Personal history of pneumonia (recurrent): Secondary | ICD-10-CM | POA: Insufficient documentation

## 2010-12-14 DIAGNOSIS — G4733 Obstructive sleep apnea (adult) (pediatric): Secondary | ICD-10-CM | POA: Insufficient documentation

## 2010-12-14 DIAGNOSIS — E785 Hyperlipidemia, unspecified: Secondary | ICD-10-CM | POA: Insufficient documentation

## 2010-12-14 DIAGNOSIS — Z5189 Encounter for other specified aftercare: Secondary | ICD-10-CM | POA: Insufficient documentation

## 2010-12-14 DIAGNOSIS — J4489 Other specified chronic obstructive pulmonary disease: Secondary | ICD-10-CM | POA: Insufficient documentation

## 2010-12-16 ENCOUNTER — Encounter (HOSPITAL_COMMUNITY): Payer: Medicare Other | Attending: Internal Medicine

## 2010-12-16 ENCOUNTER — Ambulatory Visit (HOSPITAL_COMMUNITY): Payer: Self-pay

## 2010-12-21 ENCOUNTER — Ambulatory Visit (HOSPITAL_COMMUNITY): Payer: Self-pay

## 2010-12-21 ENCOUNTER — Encounter (HOSPITAL_COMMUNITY): Payer: Medicare Other

## 2010-12-23 ENCOUNTER — Encounter (HOSPITAL_COMMUNITY): Payer: Medicare Other

## 2010-12-23 ENCOUNTER — Ambulatory Visit (HOSPITAL_COMMUNITY): Payer: Self-pay

## 2010-12-28 ENCOUNTER — Ambulatory Visit (HOSPITAL_COMMUNITY): Payer: Self-pay

## 2010-12-28 ENCOUNTER — Encounter (HOSPITAL_COMMUNITY): Payer: Medicare Other

## 2010-12-29 ENCOUNTER — Ambulatory Visit (HOSPITAL_COMMUNITY)
Admission: RE | Admit: 2010-12-29 | Discharge: 2010-12-29 | Disposition: A | Discharge status: Home / Self Care | Payer: Medicare Other | Source: Ambulatory Visit | Attending: Internal Medicine | Admitting: Internal Medicine

## 2010-12-29 DIAGNOSIS — Z1231 Encounter for screening mammogram for malignant neoplasm of breast: Secondary | ICD-10-CM | POA: Insufficient documentation

## 2010-12-30 ENCOUNTER — Ambulatory Visit (HOSPITAL_COMMUNITY): Payer: Self-pay

## 2010-12-30 ENCOUNTER — Ambulatory Visit (HOSPITAL_COMMUNITY): Payer: Medicare Other

## 2011-01-04 ENCOUNTER — Ambulatory Visit (HOSPITAL_COMMUNITY): Payer: Self-pay

## 2011-01-04 ENCOUNTER — Encounter (HOSPITAL_COMMUNITY): Payer: Medicare Other

## 2011-01-06 ENCOUNTER — Ambulatory Visit (HOSPITAL_COMMUNITY): Payer: Self-pay

## 2011-01-06 ENCOUNTER — Encounter (HOSPITAL_COMMUNITY): Payer: Medicare Other

## 2011-01-11 ENCOUNTER — Encounter (HOSPITAL_COMMUNITY): Payer: Medicare Other | Attending: Internal Medicine

## 2011-01-11 ENCOUNTER — Ambulatory Visit (HOSPITAL_COMMUNITY): Payer: Self-pay

## 2011-01-11 DIAGNOSIS — J984 Other disorders of lung: Secondary | ICD-10-CM | POA: Insufficient documentation

## 2011-01-11 DIAGNOSIS — Z5189 Encounter for other specified aftercare: Secondary | ICD-10-CM | POA: Insufficient documentation

## 2011-01-11 DIAGNOSIS — G4733 Obstructive sleep apnea (adult) (pediatric): Secondary | ICD-10-CM | POA: Insufficient documentation

## 2011-01-11 DIAGNOSIS — Z87891 Personal history of nicotine dependence: Secondary | ICD-10-CM | POA: Insufficient documentation

## 2011-01-11 DIAGNOSIS — R0602 Shortness of breath: Secondary | ICD-10-CM | POA: Insufficient documentation

## 2011-01-11 DIAGNOSIS — J4489 Other specified chronic obstructive pulmonary disease: Secondary | ICD-10-CM | POA: Insufficient documentation

## 2011-01-11 DIAGNOSIS — R011 Cardiac murmur, unspecified: Secondary | ICD-10-CM | POA: Insufficient documentation

## 2011-01-11 DIAGNOSIS — K219 Gastro-esophageal reflux disease without esophagitis: Secondary | ICD-10-CM | POA: Insufficient documentation

## 2011-01-11 DIAGNOSIS — I10 Essential (primary) hypertension: Secondary | ICD-10-CM | POA: Insufficient documentation

## 2011-01-11 DIAGNOSIS — Z8701 Personal history of pneumonia (recurrent): Secondary | ICD-10-CM | POA: Insufficient documentation

## 2011-01-11 DIAGNOSIS — J449 Chronic obstructive pulmonary disease, unspecified: Secondary | ICD-10-CM | POA: Insufficient documentation

## 2011-01-11 DIAGNOSIS — E785 Hyperlipidemia, unspecified: Secondary | ICD-10-CM | POA: Insufficient documentation

## 2011-01-13 ENCOUNTER — Ambulatory Visit (HOSPITAL_COMMUNITY): Payer: Self-pay

## 2011-01-13 ENCOUNTER — Encounter (HOSPITAL_COMMUNITY): Payer: Medicare Other

## 2011-01-18 ENCOUNTER — Encounter (HOSPITAL_COMMUNITY): Payer: Medicare Other

## 2011-01-18 ENCOUNTER — Ambulatory Visit (HOSPITAL_COMMUNITY): Payer: Self-pay

## 2011-01-20 ENCOUNTER — Ambulatory Visit (HOSPITAL_COMMUNITY): Payer: Self-pay

## 2011-01-20 ENCOUNTER — Encounter (HOSPITAL_COMMUNITY): Payer: Medicare Other

## 2011-01-25 ENCOUNTER — Ambulatory Visit (HOSPITAL_COMMUNITY): Payer: Self-pay

## 2011-01-25 ENCOUNTER — Encounter (HOSPITAL_COMMUNITY): Payer: Medicare Other

## 2011-01-27 ENCOUNTER — Encounter (HOSPITAL_COMMUNITY): Payer: Medicare Other

## 2011-01-27 ENCOUNTER — Ambulatory Visit (HOSPITAL_COMMUNITY): Payer: Self-pay

## 2011-02-01 ENCOUNTER — Ambulatory Visit (HOSPITAL_COMMUNITY): Payer: Self-pay

## 2011-02-01 ENCOUNTER — Encounter (HOSPITAL_COMMUNITY): Payer: Medicare Other

## 2011-02-03 ENCOUNTER — Encounter (HOSPITAL_COMMUNITY): Payer: Medicare Other

## 2011-02-03 ENCOUNTER — Ambulatory Visit (HOSPITAL_COMMUNITY): Payer: Self-pay

## 2011-02-04 ENCOUNTER — Ambulatory Visit (INDEPENDENT_AMBULATORY_CARE_PROVIDER_SITE_OTHER): Payer: Medicare Other | Admitting: Internal Medicine

## 2011-02-04 VITALS — BP 138/84 | HR 91 | Temp 98.1°F | Wt 233.0 lb

## 2011-02-04 DIAGNOSIS — J209 Acute bronchitis, unspecified: Secondary | ICD-10-CM

## 2011-02-04 MED ORDER — PHENTERMINE HCL 37.5 MG PO CAPS: 37.5000 mg | ORAL_CAPSULE | ORAL | Status: AC

## 2011-02-04 MED ORDER — METHYLPREDNISOLONE 4 MG PO KIT: PACK | ORAL | Status: DC

## 2011-02-04 MED ORDER — AZITHROMYCIN 500 MG PO TABS: 500.0000 mg | ORAL_TABLET | Freq: Every day | ORAL | Status: DC

## 2011-02-04 MED ORDER — AZITHROMYCIN 500 MG PO TABS: 500.0000 mg | ORAL_TABLET | Freq: Every day | ORAL | Status: AC

## 2011-02-04 NOTE — Progress Notes (Signed)
Subjective:    Patient ID: [name], female    DOB: [birth year], 65 y.o.   MRN: [id number]  HPI Started last night with coughing- nonproductive, wheezing, shortness of breath. No fever, no chills. This is similar to previous episodes.   Current Medications (verified):  1)  Calcium 600 Mg  Tabs (Calcium) .... 2 Once Daily 2)  Aspirin 325 Mg  Tabs (Aspirin) .Marland Kitchen.. 1 Once Daily` 3)  Lovastatin 40 Mg  Tabs (Lovastatin) .Marland Kitchen.. 1 Once Daily 4)  Albuterol 90 Mcg/act  Aers (Albuterol) .... As Needed 5)  Albuterol Sulfate (2.5 Mg/93ml) 0.083%  Nebu (Albuterol Sulfate) .... Nebulized Qid As Needed 6)  Betamethasone Dipropionate 0.05 % Crea (Betamethasone Dipropionate) .... Apply Two Times A Day As Needed 7)  Spiriva Handihaler 18 Mcg Caps (Tiotropium Bromide Monohydrate) .... Inhale Contents of 1 Capsule Using Handihaler Daily. 8)  Singulair 10 Mg Tabs (Montelukast Sodium) .Marland Kitchen.. 1 By Mouth Daily 9)  Phentermine Hcl 37.5 Mg Tabs (Phentermine Hcl) .Marland Kitchen.. 1 By Mouth Q Am Fo Rweight Loss 10)  Nasonex 50 Mcg/act Susp (Mometasone Furoate) .... One Spary Daily 11)  Methylprednisolone (Pak) 4 Mg Tabs (Methylprednisolone) .... As Directed 12)  Ventolin Hfa 108 (90 Base) Mcg/act Aers (Albuterol Sulfate) .... 2 Puffs Every 4-6 Hours As Needed  Allergies (verified):  1)  Sulfa 2)  Codeine 3)  Prednisone  Past History:  Past Medical History: Last updated: 07/16/2008 IBS Hyperlipidemia Allergic rhinitis Pneumonia, hx of intertriginous yeast infection (breasts) COPD/ASthma March  2008, She had an FEV-1 of 60% with FEV-1: FVC ratio  rreduced at 68 and a normal     diffusion capacity back in 2007.  Dr.   Sung Amabile, who was her pulmonary care physician, and was treating         her COPD with asthma with Spiriva/Advair 250/50 and Singulair 10 mg.  October 2009: PFTs - fev1 1.8L/72%, Ratio 67 (73),  TLC 5L/91%, DLCO 20/71% 15 pack smoking history - quit 1976 Ejection systolic murmur - echo 01/2008 shows lvh and  likely gradient. Valve was never visualized Heart murmur- Aortic Hammer toe deformities Pneumovax - July 2009 RLL nodule - 6mm stable 2006, april 2009 and June 2009. No further followup  Past Surgical History: Last updated: 14-Aug-2007 Tonsillectomy Insertion of "X-stop" L4-5  Oct '06  Elsner Lumbar laminectomy-with fusion  L4-5 April '07  Elsner Carpal tunnel release-bilateral left Dec '04, right Dec '05   [place]ierks bunionectomy right foot  G2P2 NSVD  Family History: Last updated: August 14, 2007 mother -died 2023-01-21  CAD, HTN, DM fathr-died at 89 lung cancer neg for  breast, colon cancer  Social History: Last updated: 12/13/2007 married 1966 2 sons, 1 grandchild retired-[employer] doing OK March '09 - financially tough times        Review of Systems  Constitutional: Positive for fever. Negative for chills and activity change.  HENT: Negative for congestion, rhinorrhea and neck pain.   Eyes: Negative for photophobia and pain.  Respiratory: Positive for cough, chest tightness, shortness of breath and wheezing.   Cardiovascular: Negative for chest pain, palpitations and leg swelling.  Gastrointestinal: Negative.   Genitourinary: Negative.   Musculoskeletal: Negative.   Neurological: Positive for dizziness, tremors, weakness and headaches. Negative for facial asymmetry.  Hematological: Negative.        Objective:   Physical Exam Overweight white woman in no acute distress but feels bad   HEENT - [place] supple, no cervical adenopathy Chest - good inspiratory effort. Feint expiratory wheeze  worse on the right. No rales. No increased work of breathing Cor -RRR   Assessment & Plan:  1. Asthmatic bronchitis - plan azithromycin tri-pak; medrol 4mg  dosepak, anti-tussive of choice. (phenergan with codeine is ok to use)

## 2011-02-08 ENCOUNTER — Encounter (HOSPITAL_COMMUNITY): Payer: Medicare Other | Attending: Internal Medicine

## 2011-02-08 ENCOUNTER — Ambulatory Visit (HOSPITAL_COMMUNITY): Payer: Self-pay

## 2011-02-08 DIAGNOSIS — J449 Chronic obstructive pulmonary disease, unspecified: Secondary | ICD-10-CM | POA: Insufficient documentation

## 2011-02-08 DIAGNOSIS — J4489 Other specified chronic obstructive pulmonary disease: Secondary | ICD-10-CM | POA: Insufficient documentation

## 2011-02-08 DIAGNOSIS — E785 Hyperlipidemia, unspecified: Secondary | ICD-10-CM | POA: Insufficient documentation

## 2011-02-08 DIAGNOSIS — I10 Essential (primary) hypertension: Secondary | ICD-10-CM | POA: Insufficient documentation

## 2011-02-08 DIAGNOSIS — Z87891 Personal history of nicotine dependence: Secondary | ICD-10-CM | POA: Insufficient documentation

## 2011-02-08 DIAGNOSIS — J984 Other disorders of lung: Secondary | ICD-10-CM | POA: Insufficient documentation

## 2011-02-08 DIAGNOSIS — R011 Cardiac murmur, unspecified: Secondary | ICD-10-CM | POA: Insufficient documentation

## 2011-02-08 DIAGNOSIS — K219 Gastro-esophageal reflux disease without esophagitis: Secondary | ICD-10-CM | POA: Insufficient documentation

## 2011-02-08 DIAGNOSIS — Z8701 Personal history of pneumonia (recurrent): Secondary | ICD-10-CM | POA: Insufficient documentation

## 2011-02-08 DIAGNOSIS — Z5189 Encounter for other specified aftercare: Secondary | ICD-10-CM | POA: Insufficient documentation

## 2011-02-08 DIAGNOSIS — R0602 Shortness of breath: Secondary | ICD-10-CM | POA: Insufficient documentation

## 2011-02-08 DIAGNOSIS — G4733 Obstructive sleep apnea (adult) (pediatric): Secondary | ICD-10-CM | POA: Insufficient documentation

## 2011-02-10 ENCOUNTER — Ambulatory Visit (HOSPITAL_COMMUNITY): Payer: Self-pay

## 2011-02-10 ENCOUNTER — Encounter (HOSPITAL_COMMUNITY): Payer: Medicare Other

## 2011-02-11 ENCOUNTER — Other Ambulatory Visit: Payer: Self-pay | Admitting: Internal Medicine

## 2011-02-15 ENCOUNTER — Encounter (HOSPITAL_COMMUNITY): Payer: Medicare Other

## 2011-02-15 ENCOUNTER — Ambulatory Visit (HOSPITAL_COMMUNITY): Payer: Self-pay

## 2011-02-16 ENCOUNTER — Telehealth: Payer: Self-pay | Admitting: Internal Medicine

## 2011-02-16 MED ORDER — TIOTROPIUM BROMIDE MONOHYDRATE 18 MCG IN CAPS: 18.0000 ug | ORAL_CAPSULE | Freq: Every day | RESPIRATORY_TRACT | Status: DC

## 2011-02-16 MED ORDER — MONTELUKAST SODIUM 10 MG PO TABS: 10.0000 mg | ORAL_TABLET | Freq: Every day | ORAL | Status: DC

## 2011-02-16 NOTE — Telephone Encounter (Signed)
Spoke with pt and advised okay to send in refills, but needs appt as she is overdue.  I scheduled her to see MR on 03/15/11 at 11:30 am.  Refills sent to pharm.

## 2011-02-17 ENCOUNTER — Ambulatory Visit (HOSPITAL_COMMUNITY): Payer: Self-pay

## 2011-02-17 ENCOUNTER — Ambulatory Visit (INDEPENDENT_AMBULATORY_CARE_PROVIDER_SITE_OTHER): Payer: Medicare Other | Admitting: Internal Medicine

## 2011-02-17 ENCOUNTER — Encounter (HOSPITAL_COMMUNITY): Payer: Medicare Other

## 2011-02-17 ENCOUNTER — Encounter: Payer: Self-pay | Admitting: Internal Medicine

## 2011-02-17 VITALS — BP 136/66 | HR 80 | Temp 98.0°F | Ht 68.0 in | Wt 236.6 lb

## 2011-02-17 DIAGNOSIS — R011 Cardiac murmur, unspecified: Secondary | ICD-10-CM

## 2011-02-17 DIAGNOSIS — J441 Chronic obstructive pulmonary disease with (acute) exacerbation: Secondary | ICD-10-CM

## 2011-02-17 MED ORDER — DOXYCYCLINE HYCLATE 100 MG PO TBEC: 100.0000 mg | DELAYED_RELEASE_TABLET | Freq: Two times a day (BID) | ORAL | Status: AC

## 2011-02-17 MED ORDER — METHYLPREDNISOLONE (PAK) 4 MG PO TABS: ORAL_TABLET | ORAL | Status: AC

## 2011-02-17 MED ORDER — LEVALBUTEROL HCL 0.63 MG/3ML IN NEBU
0.6300 mg | INHALATION_SOLUTION | Freq: Once | RESPIRATORY_TRACT | Status: AC
  Administered 2011-02-17: 0.63 mg via RESPIRATORY_TRACT

## 2011-02-17 MED ORDER — METHYLPREDNISOLONE ACETATE 80 MG/ML IJ SUSP
80.0000 mg | Freq: Once | INTRAMUSCULAR | Status: AC
  Administered 2011-02-17: 80 mg via INTRAMUSCULAR

## 2011-02-17 NOTE — Progress Notes (Signed)
  Subjective:    Patient ID: [name], female    DOB: 03/31/1946, 65 y.o.   MRN: [id number]  HPI 65year old female. Known COPD patient. She has been doing well on  Her stable regimen of inahlers. Has trip coming up but past week or so has increased congestion, sore throat, sinus pressure and nasal drainage. Associated with this is increased cough, chest tightness, dyspnea and wheezing. She feels she is in AECOPD although denies sputum. There is no chest pain.    Review of Systems  Constitutional: Negative for fever and unexpected weight change.  HENT: Positive for congestion, sore throat, rhinorrhea, postnasal drip and sinus pressure. Negative for ear pain, nosebleeds, sneezing, trouble swallowing and dental problem.   Eyes: Negative for redness and itching.  Respiratory: Positive for cough, chest tightness, shortness of breath and wheezing.   Cardiovascular: Negative for chest pain, palpitations and leg swelling.  Gastrointestinal: Negative for nausea, vomiting and diarrhea.  Genitourinary: Negative for dysuria.  Musculoskeletal: Negative for joint swelling.  Skin: Negative for rash.  Neurological: Positive for headaches.  Hematological: Does not bruise/bleed easily.  Psychiatric/Behavioral: Negative for dysphoric mood. The patient is not nervous/anxious.        Objective:   Physical Exam  [vitalsreviewed. Constitutional: She is oriented to person, place, and time. She appears well-developed and well-nourished. No distress (obese).  HENT:  Head: Normocephalic and atraumatic.  Right Ear: External ear normal.  Left Ear: External ear normal.  Mouth/Throat: Oropharynx is clear and moist. No oropharyngeal exudate.  Eyes: Conjunctivae and EOM are normal. Pupils are equal, round, and reactive to light. Right eye exhibits no discharge. Left eye exhibits no discharge. No scleral icterus.  Neck: Normal range of motion. Neck supple. No JVD present. No tracheal deviation present. No  thyromegaly present.  Cardiovascular: Normal rate, regular rhythm and intact distal pulses.  Exam reveals no gallop and no friction rub.   Murmur heard.      Ejection systolic murmur sounds louder than before  Pulmonary/Chest: Effort normal. No respiratory distress. She has wheezes. She has no rales. She exhibits no tenderness.       No distress Full sentences +  Abdominal: Soft. Bowel sounds are normal. She exhibits no distension and no mass. There is no tenderness. There is no rebound and no guarding.  Musculoskeletal: Normal range of motion. She exhibits no edema and no tenderness.  Lymphadenopathy:    She has no cervical adenopathy.  Neurological: She is alert and oriented to person, place, and time. She has normal reflexes. No cranial nerve deficit. She exhibits normal muscle tone. Coordination normal.  Skin: Skin is warm and dry. No rash noted. She is not diaphoretic. No erythema. No pallor.  Psychiatric: She has a normal mood and affect. Her behavior is normal. Judgment and thought content normal.          Assessment & Plan:

## 2011-02-17 NOTE — Patient Instructions (Addendum)
#  COPD You are in the middle of a copd attack HAve nebulizer xopenex and IM steroid depot medrol 80mg  IM x 1 Take doxycycline and methyl pred taper as scheduled #heart murmur Sorry you were disappointed with [facility] I will refer you to Dr. Alanda Amass at [facility] per your request #FOLLOWUP - 4-6 months or sooner if nnot better

## 2011-02-22 ENCOUNTER — Encounter (HOSPITAL_COMMUNITY): Payer: Medicare Other

## 2011-02-22 ENCOUNTER — Ambulatory Visit (HOSPITAL_COMMUNITY): Payer: Self-pay

## 2011-02-22 NOTE — Discharge Summary (Signed)
NAMEMINAH, [name]              ACCOUNT NO.:  [id number]   MEDICAL RECORD NO.:  [id number]          PATIENT TYPE:  INP   LOCATION:  1309                         FACILITY:  [facility]   PHYSICIAN:  Valetta Mole. Swords, MD    DATE OF BIRTH:  03/22/1946   DATE OF ADMISSION:  02/04/2008  DATE OF DISCHARGE:  02/08/2008                               DISCHARGE SUMMARY   DISCHARGE DIAGNOSES:  1. Acute bronchitis versus pneumonia with abnormal chest x-ray and CT      scan.  Needs follow-up.  2. Reactive airway disease.  3. Chronic obstructive pulmonary disease.  4. Hyperlipidemia.  5. Irritable bowel syndrome.   PAST SURGICAL HISTORY:  Outlined on admission note.   DISCHARGE MEDICATIONS:  1. Methylprednisolone 48 mg p.o. daily x3 days, 32 mg p.o. daily x3      days, 24 mg p.o. daily x3 days, 16 mg p.o. daily x3 days, 8 mg p.o.      daily x3, then discontinue. (Recommended taper by Dr. Marchelle Gearing).  2. Effexor 75 mg p.o. daily.  3. Benicar/hydrochlorothiazide 40/12.5 one p.o. daily.  4. Aspirin 325 mg p.o. daily.  5. Lovastatin 40 mg p.o. daily.  6. Spiriva 18 mcg inhaled daily.  7. Singulair 10 mg p.o. daily.  8. Albuterol nebulizer four times daily.  9. Advair Diskus at her usual home dose b.i.d.  10.Doxycycline 100 mg p.o. b.i.d. x7 days.   HOSPITAL PROCEDURES:  1. CT scan of the chest on February 04, 2008, demonstrates interval      development of patchy alveolar opacities throughout both lungs.      Right lung nodular regions are stable.  Coronary artery      calcifications.  2. Echocardiogram  performed February 06, 2008, demonstrated a mean      transaortic valve gradient of 26.  There appears to be left      ventricular hypertrophy with normal EF, right ventricle appears to      be normal size.   LABORATORY DATA:  Influenza A and B virus negative.  Legionella antigen  negative.  Strep antigen negative.  BMET on February 06, 2008, significant  for glucose 205, BUN 29.  CBC on February 06, 2008, demonstrated white  count 25.2 (on steroids).  CBC February 04, 2008, demonstrated white count  of 16.6.  D-dimer normal at 0.28.  Sedimentation rate mildly elevated at  28 (normal 0-22).   CONDITION ON DISCHARGE:  Improved.  Patient ambulating in the halls  without difficulty.  Pending oxygen saturation status.   FOLLOW UP:  1. Rosalyn Gess Norins, MD, next week.  2. [name], MD, after CAT scan.  That has been arranged by      Dr. Marchelle Gearing.   HOSPITAL COURSE:  Patient admitted to the Hospitalists service on February 04, 2008.  See admission note for details.  Patient admitted with acute  bronchitis and hypoxemia.  Etiology of respiratory unclear.  Patient  seen in consultation by Dr. Marchelle Gearing.  Working diagnosis that patient  had a viral or bacterial pneumonia that has not yet cleared  radiologically.  She  will need follow-up CT scan.  We will treat with  prolonged course of steroid taper.  She may need home oxygen.  We will  continue albuterol nebulizers at home.  Possibly a candidate for  pulmonary rehab.  Consideration was given to bronchoscopy but it was  thought that that could wait for the time being.   Obstructive sleep apnea.  Patient noncompliant with CPAP.   CONDITION ON DISCHARGE:  Patient ambulating without difficulty,  tolerating a diet.      Bruce Rexene Edison Swords, MD  Electronically Signed     BHS/MEDQ  D:  02/08/2008  T:  02/08/2008  Job:  [id number]

## 2011-02-22 NOTE — Consult Note (Signed)
NAMECHRYSTA, [name]              ACCOUNT NO.:  [id number]   MEDICAL RECORD NO.:  [id number]          PATIENT TYPE:  INP   LOCATION:  1309                         FACILITY:  [facility]   PHYSICIAN:  [name], MD   DATE OF BIRTH:  1946-01-02   DATE OF CONSULTATION:  02/05/2008  DATE OF DISCHARGE:                                 CONSULTATION   REASON FOR CONSULTATION:  Recurrent pneumonia, dyspnea and cough.   PULMONARY PRIMARY CARE PROVIDERS:  1. Oley Balm Sung Amabile, MD.  2. Charlaine Dalton. Sherene Sires, MD, FCCP.   TIME OF CONSULTATION:  February 05, 2008 between 3:45 p.m. and 4:30 p.m.;  40-minute inpatient pulmonary consult.   HISTORY OF PRESENT ILLNESS:  [name] is a pleasant 65 year old  morbidly obese female, remote smoker, who used to be followed by Dr.  Billy Fischer and also Dr. Sandrea Hughs for COPD/asthma.  She was  recently admitted to the hospital between December 31, 2007, and January 05, 2008, with symptoms of COPD exacerbation and x-ray showing pneumonia in  the form of bilateral perihilar infiltrates and some lower lobe air  space disease.  She was treated with Avelox and azithromycin and Solu-  Medrol.  At admission she was indeed hypoxemic and by discharge, had a  pulse-ox of 91% on room air.  At discharge she did began to feel better  and subsequently felt at baseline health which is dyspnea on exertion  for walking 100-200 feet.   Because she felt well, she did undertake a car trip with her husband and  grandchildren to [place] in [place], [place].  She left on January 20, 2008, and returned on April 15 or 16, 2009.  She says the car  journey was long, but every two hours they took breaks and she did walk  around.  Following return from [place], [place], on the 16th, she  developed cough, congestion, chills, wheezing and some low grade fever  on the 17th.  She was seen in the internal medicine office by Dr. Shellia Carwin on the 18th and was given Rocephin and  methylprednisolone.  She did  begin to feel better after this, but this past weekend which is April 25-  26, 2006, she began to feel worse.  Therefore she saw Dr. Illene Regulus  in the office on February 04, 2008, which was yesterday, with worsening  symptoms.  Saturation in the office was low in the 80s and therefore she  was admitted.  She underwent a CT scan of the chest which showed  bilateral scattered nonspecific airspace disease.  There is concern of  recurrent pneumonia despite recent pneumonia treatment and therefore we  have been consulted.  Currently, she feels slightly improved since  admission due to methylprednisolone high dose.  Interestingly, she is  not on any antibiotic treatment at this stage.  However, she admits to  class 4 dyspnea which is a significant change from baseline.   PAST MEDICAL HISTORY:  1. COPD/asthma.  I am not sure what exactly she has, whether it is      COPD or asthma, based  on reading Dr. Rick Duff note from March      2008, it seems that she had an FEV-1 of 60% with FEV-1: FVC ratio      reduced at 57 and a normal diffusion capacity back in 2007.  Dr.      Sung Amabile, who was her pulmonary care physician, and was treating her      COPD with asthma with Spiriva/Advair 250/50 and Singulair 10 mg.  2. Hypertension.  3. Morbid obesity.  4. Carpal tunnel syndrome.   ALLERGIES:  She is allergic to SULFA and CODEINE and PREDNISONE.   PAST SURGICAL HISTORY:  1. Tonsillectomy.  2. Insertion of X-stop L4-L5 October 2006 by Dr. Danielle Dess.  3. Lumbar laminectomy with fusion L4-L5 April 2007 by Dr. Danielle Dess.  4. Carpal tunnel release.  5. Bunionectomy right foot.  6. Gravida 2, para 2.   FAMILY HISTORY:  Mother died of coronary artery disease and hypertension  and diabetes in 01-31-2006.  Father died at 75 due to lung cancer.   SOCIAL HISTORY:  She is married since 1966, two sons, one grandchild.  Retired from [employer].  She is having financial difficulties  since March  2009.   HOME MEDICATIONS:  Include vitamin E, calcium, Effexor, Benicar,  aspirin, lovastatin, Spiriva, Singulair, albuterol, Advair 250/50,  betamethasone,  dipropionate, eyedrops, Nystatin and most recently Solu-  Medrol tablet.   CURRENT LABORATORY VALUES:  White count 16.6, hemoglobin 14.7, platelet  count 211.   Chemistries:  Sodium 139, potassium 4, chloride 99, bicarb 31, BUN 17,  creatinine 0.86.   ESR is normal at 28.   BNP is 65.   Arterial blood gas:  No arterial blood gas done to date.   RADIOLOGY:  1. She had a normal chest x-ray back in October 2008.  2. Chest x-ray most recently in December 31, 2007, showed bilateral      perihilar infiltrates with new left upper lobe nodular opacity and      left retrocardiac consolidation.  3. Follow-up chest x-ray on January 02, 2008, showed the same findings.      There is no current chest x-ray for follow-up at this admission but      she has a CT scan of the chest that does show one stable 7 mm solid      pulmonary nodule, stable since 2006, otherwise bilateral scattered      patchy air space disease consistent with viral pneumonitis.   REVIEW OF SYSTEMS:  Significant for sleep apnea, noncompliant with CPAP  and chronic postnasal drip.   ASSESSMENT AND PLAN:  Most likely recurrent symptoms constitute acute  exacerbation of chronic obstructive pulmonary disease following a trip  to [place].  The etiology is likely infectious because following  this, her husband also felt sick with similar symptoms.  I suspect the x-  ray findings of infiltrates are resolving infiltrates from the previous  admission in March as opposed to a new problem.   The other question is whether she had swine flu or not.  I doubt that is  the case because there is no reported case in [place] and she denies any  travel to [place], [place], [employer], [place], [place] or [place].  In any  event, she is too many days from getting a positive  antigen testing for  flu.   It is possible that she has also had deep venous thrombosis and  pulmonary embolism following long periods of immobility on her car trip.  She is at risk for this given her obesity, immobility and multiple  medical problems.  The CT scan done yesterday involved a dye load of  100 per the radiologist.  He feels, given her normal BMET, it would be  safe for [place] to do CT angiogram.  In any event, I would want to lower the  risk of giving her another dye.  Therefore we will just do serial  Dopplers and do D-dimer.  If the D-dimer is negative we will stop  further workup.   PLAN:  1. Recommend steroids.  2. Doxycycline for five days.  3. Steps to rule out DVT and PE.  4. Agree with nurse practitioner's Brett Canales Minor's attempt to resume      CPAP for sleep apnea and Flonase for postnasal drip.      [name], MD  Electronically Signed     MR/MEDQ  D:  02/05/2008  T:  02/05/2008  Job:  [id number]

## 2011-02-22 NOTE — Discharge Summary (Signed)
[name], NOKES              ACCOUNT NO.:  [id number]   MEDICAL RECORD NO.:  [id number]          PATIENT TYPE:  INP   LOCATION:  1514                         FACILITY:  [facility]   PHYSICIAN:  Rosalyn Gess. Norins, MD  DATE OF BIRTH:  1946/04/14   DATE OF ADMISSION:  12/31/2007  DATE OF DISCHARGE:  01/05/2008                               DISCHARGE SUMMARY   ADMITTING DIAGNOSES:  Bronchitic exacerbation of COPD (chronic  obstructive pulmonary disease) with asthma component with hypoxemia.   DISCHARGE DIAGNOSES:  Probable left lower lobe pneumonia with  respiratory insufficiency.   HISTORY OF PRESENT ILLNESS:  The patient presented to the office on the  day of admission with a several day history of increasing shortness of  breath, cough, fever, nausea and wheezing.  Because of a history of  respiratory insufficiency in the past and the fact that her O2 sat in  the office was 87% the patient was admitted to the hospital for IV  antibiotics and respiratory support.   Please see the H and P for past medical history, family history, social  history.   Physical exam at admission was significant for the patient having  respiratory distress with increased work of breathing, and O2 sat at 87%  on room air.  Lungs revealed increased work of breathing, prolonged  expiratory phase, pursed lip breathing, diffuse wheezing and mild  rhonchi.  Heart rate was regular with no murmurs, rubs or gallops.   HOSPITAL COURSE:  The patient was started on oral azithromycin and  Avelox 100 mg daily.  She was started on albuterol handheld nebulizer  treatments q.i.d.  She was continued on Singulair and Spiriva.  The  patient was started on IV steroids at 80 mg of Solu-Medrol q.8 h.  Over  the ensuing days the patient continued to improve.  Her steroids were  reduced to 40 mg q. 12 and at that point she did have some recurrent  wheezing and she was bumped back up to 80 mg q. 12.  Again, a taper was  attempted and on the morning of discharge after being on Solu-Medrol 40  mg for greater than 12 hours her lungs were clear and she was felt to be  stable for discharge home to continue with steroid taper using  methylprednisolone.   DISCHARGE EXAMINATION:  VITAL SIGNS:  The patient is afebrile.  Blood  pressure 146/74, room air oxygen saturation was 91%.  GENERAL APPEARANCE:  This is an obese Caucasian female sitting up in a  chair.  She has no increased work of breathing and no distress.  CHEST:  The patient is moving air well.  I did not appreciate any end-  expiratory wheezing.   With the patient's symptoms having resolved and her respiratory rate  being stable she is thought ready for discharge.   DISPOSITION:  Is to discharge home.   DISCHARGE INSTRUCTIONS:  The patient will continue on Avelox 400 mg  daily for an additional 2 days.  Azithromycin has been completed.  She  will be started on methylprednisolone taper at 16 mg daily x3, 8 mg  daily  x3, 4 mg daily until seen in followup.  The patient will be seen in the  office for followup in approximately 1 week.  She will call sooner if  she has any progressive symptoms.  The patient's condition at time of  discharge dictation is stable and improved.      Rosalyn Gess Norins, MD  Electronically Signed     MEN/MEDQ  D:  01/05/2008  T:  01/06/2008  Job:  [id number]

## 2011-02-24 ENCOUNTER — Encounter (HOSPITAL_COMMUNITY): Payer: Medicare Other

## 2011-02-24 ENCOUNTER — Ambulatory Visit (HOSPITAL_COMMUNITY): Payer: Self-pay

## 2011-02-25 ENCOUNTER — Encounter: Payer: Self-pay | Admitting: Internal Medicine

## 2011-02-25 NOTE — Assessment & Plan Note (Signed)
Cardiac murmur appears louder than before. She is not happy with [facility] and instead prefers to follow with her husband's cardiologist Dr Young Berry. So, we will set a referral to see him

## 2011-02-25 NOTE — Cardiovascular Report (Signed)
[name], [name]              ACCOUNT NO.:  [id number]   MEDICAL RECORD NO.:  [id number]          PATIENT TYPE:  INP   LOCATION:  5021                         FACILITY:  [facility]   PHYSICIAN:  Stacie Glaze, [name] LHCDATE OF BIRTH:  11-20-1945   DATE OF PROCEDURE:  DATE OF DISCHARGE:  12/18/2004                              CARDIAC CATHETERIZATION   DISCHARGE DIAGNOSES:  1.  Acute asthmatic bronchitis.  2.  Hypoxemia.  3.  Hypertension.  4.  Anxiety.  5.  Oxygen-dependent chronic obstructive pulmonary disease.   HOSPITAL COURSE:  The patient is a 64 year old white female with a history  of asthmatic bronchitis who was admitted via the emergency room for acute  asthmatic bronchitis with severe dyspnea, who underwent a detailed workup  including a CT of the chest to rule out pulmonary emboli,  a Pulmonary  consultation for her severe asthmatic bronchitis, a two-dimensional  echocardiogram to rule out pulmonary hypertension, and was found to have no  pulmonary emboli, normal cardiovascular workup, but severe asthmatic  bronchitis that had probably evolved to being steroid and O2 dependent.   The patient was gradually tapered off IV steroids, hand-held nebulizer  therapy, and continued on antibiotic therapy for an underlying precipitating  infection during her hospitalization.  On the day prior to her discharge,  she was ambulating in the hallway, her oxygen saturation rate was 86% on  room air, and she was placed back on two liters.  She has been stable on two  liters, feeling well, able to perform daily ADLs, and believes that she can  maintain her home status.  She is discharged to home on oxygen two liters  via nasal cannula and home hand-held nebulizer therapy doing well with  Combivent q.i.d.  She will resume all home hypertensive medications as  prior, and her new medications at discharge are the following:   NEW MEDICATIONS AT DISCHARGE:  1.  Medrol.  She will be on a  Medrol taper over the next 10 days starting      out with 8 mg p.o. b.i.d., then going to 4 mg p.o. b.i.d., then 4 mg      p.o. daily, then 2 mg p.o. daily.  2.  Avelox 400 mg p.o. for 4 additional days.  3.  Xanax every 6 hours as needed for acute anxiety from her steroids.  4.  Ambien 10 mg for sleep from her steroids.  5.  Combivent home nebulizers.  6.  Home oxygen.  All other medications will be resumed as prior to her admission including  her hypertensives, Effexor, and her hyperlipidemia medications.   FOLLOWUP:  1.  She is to follow up with Dr. Illene Regulus in one week.  2.  She is to follow up with Dr. Sung Amabile, Pulmonary Critical Care, in two      weeks.      JEJ/MEDQ  D:  12/18/2004  T:  12/19/2004  Job:  [id number]

## 2011-02-25 NOTE — Discharge Summary (Signed)
NAMERAYGEN, LINQUIST              ACCOUNT NO.:  [id number]   MEDICAL RECORD NO.:  [id number]          PATIENT TYPE:  INP   LOCATION:  3014                         FACILITY:  [facility]   PHYSICIAN:  Stefani Dama, [name]  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   DIAGNOSIS:  1.  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  2.  Chronic asthma.  3.  Morbid obesity.   CONDITION ON DISCHARGE:  Improving.   HOSPITAL COURSE:  [name] is a 65 year old individual who has had  significant problems with spondylitic stenosis and neurogenic claudication.  She was initially treated with an X-Stop procedure some six months ago,  however, her relief was short lived. She was returned to the operating room  where she underwent decompression arthrodesis at L4-L5.  Postoperatively,  she has had good relief of leg pain, back soreness was substantial and the  patient had considerable problems with nausea and a postoperative ileus.  This seems to have resolved itself with the use of some Phenergan and  passage of time.  Currently, the patient is ambulatory.  Her incision is  clean and dry.  Her motor strength has been intact in her lower extremities  and she has been advised regarding her postoperative activities.  She is  given a prescription for Percocet, #60, without refills, Valium 5 mg, #30,  without refills.  She will be seen in the office in approximately three  weeks' time for further follow-up.  She is also given a prescription for  Phenergan for nausea if needed.      Stefani Dama, [name]  Electronically Signed     HJE/MEDQ  D:  01/13/2006  T:  01/13/2006  Job:  [id number]

## 2011-02-25 NOTE — Op Note (Signed)
[place]  Patient:    [name], DILIBERTO Visit Number: [id number] MRN: [id number]          Service Type: DSU Location: [facility] Attending Physician:  Eloise Levels Dictated by:   Mary A. Contogiannis, [name] Proc. Date: 10/09/01 Admit Date:  10/09/2001   CC:         Rosalyn Gess. Norins, [name] [facility]   Operative Report  PREOPERATIVE DIAGNOSIS: 1. A 0.6 cm right superior earlobe suspicious skin lesion. 2. A 0.3 cm right inferior earlobe suspicious skin lesion.  POSTOPERATIVE DIAGNOSES: 1. A 0.6 cm right superior earlobe suspicious skin lesion. 2. A 0.3 cm right inferior earlobe suspicious skin lesion.  PROCEDURES: 1. Excision of 0.6 cm suspicious skin lesion, right superior earlobe. 2. Excision of 0.3 cm suspicious skin lesion, right inferior earlobe.  SURGEON:  Mary A. Contogiannis, [name]  ANESTHESIA:  1% lidocaine with epinephrine.  COMPLICATIONS:  None.  INDICATION FOR THE PROCEDURE:  The patient is a 65 year old Caucasian female who is referred by Dr. Illene Regulus for excision of suspicious right earlobe skin lesions.  Actually, she had one skin lesion prior to being referred to me that was growing clinically suspicious.  She has since developed a second one. This patient would like to proceed with excision of both of these suspicious skin lesions.  DESCRIPTION OF PROCEDURE:  The patient was brought into the minor procedure room and placed on the table in supine position.  The right ear and face was prepped with Betadine and draped in sterile fashion.  The skin and subcutaneous tissue at the base of the earlobe as well as in the area of the skin lesions were injected with 1% lidocaine with epinephrine.  After adequate hemostasis and anesthesia had taken effect, the procedure was begun.  First the 0.3 cm right inferior skin lesion was excised.  This was excised full-thickness and passed off the table to undergo permanent  pathologic section evaluation.  The incision was then closed with 6-0 Prolene suture. Next the 0.6 cm superior right earlobe skin lesion was excised full-thickness through the skin to subcutaneous tissues.  It was then passed off the table to undergo permanent pathologic section evaluation.  A small amount of the incision was tapered for closure.  The incision was then closed with 6-0 Prolene sutures.  Approximately 1 mm margins had been taken with both of the specimens.  The incisions were then dressed with bacitracin ointment and a Band-Aid.  There were no complications.  The patient tolerated the procedure well.  She was taught proper wound care and discharged home in stable condition.  Follow-up appointment will be on Friday in the office. Dictated by:   Mary A. Contogiannis, [name] Attending Physician:  Eloise Levels DD:  10/09/01 TD:  10/10/01 Job: [id number]

## 2011-02-25 NOTE — Assessment & Plan Note (Signed)
[facility]                             PULMONARY OFFICE NOTE   [name], [name]                     MRN:          [id number]  DATE:12/28/2006                            DOB:          14-Jan-1946    HISTORY:  A 65 year old white female who carries the diagnosis of COPD  status post smoking cessation in 1975 with PFT showing an FEV1 of 60%  predicted with a ratio of 57% dated September 22, 2006 with a normal  diffusing capacity.  On this basis, she had been maintained on a  combination of Advair 250/50 b.i.d. and Spiriva 1 capsule daily with  minimal need for albuterol, and chronic dyspnea with anything more than  slow ADLs for 5 to 10 minutes.  A week ago she became acutely worse with  a tickle in the throat followed by scratchy throat and congested cough  with a diagnosis of acute bronchitis rendered by Dr. Debby Bud a week ago,  and started erythromycin and Medrol, but minimally improved, and  therefore, sent to the Pulmonary Clinic today for evaluation.  Note, the  patient has seen Dr. Sung Amabile previously.   The patient denies any sinus pain, fevers, chills, sweats, purulent  sputum production, orthopnea, PND, overt reflux symptoms, leg swelling,  or chest pain.   For full inventory of medications, please see face sheet dated December 28, 2006.  Her last rescue treatment was with albuterol 5 hours ago in the  form of a nebulizer.   PHYSICAL EXAMINATION:  She is a pleasant, ambulatory, obese, white  female, in no acute distress.  She had stable vital signs.  HEENT:  Reveals minimal erythema of oropharynx.  No evidence of  excessive postnasal drainage or cobblestoning.  NECK:  Supple without cervical adenopathy or tenderness.  Trachea is  midline.  No thyromegaly.  LUNG FIELDS:  Junky expiratory rhonchi were present bilaterally.  HEART:  Regular rhythm without murmur, gallop, or rub.  ABDOMEN:  Soft and benign.  EXTREMITIES:  Warm without calf  tenderness, cyanosis, clubbing, or  edema.   IMPRESSION:  Refractory tracheobronchitis with no obvious purulent  sputum production, and fair to respond to both short course of Medrol  and erythromycin.  Of note, she has been using quite a bit of over-the-  counter cough lozenges, and her cough and upper airway congestion are  markedly disproportionate to objective findings.  I, therefore, endorsed  continuing off Advair for now, since it can irritate the upper airway,  as suggested by Dr. Debby Bud, but also added the following  recommendations:  1. For cough, use Mucinex DM 2 b.i.d.  2. Empirically add Protonix 40-mg tablets 30 minutes before breakfast      and supper for the next 2 weeks only with followup here if not back      to baseline at the end of 2 weeks.  3. Medrol 4-mg tablets 4 daily until better, then 2 daily for 3 days,      then 1 daily for 3 days, then stop.  4. Once better, restart Advair 250/50 b.i.d. since her maintenance  appears appropriate, although note, that even on her best day she      is quite limited due to dyspnea, which may      just as easily related to obesity and deconditioning as it is to      the FEV1 of 60% documented      in our records from December 14th.  5. Followup will be through Dr. Debby Bud and Dr. Sung Amabile.     [name]. Diane Sires, MD, [facility]  Electronically Signed    MBW/MedQ  DD: 12/28/2006  DT: 12/28/2006  Job #: [id number]

## 2011-02-25 NOTE — H&P (Signed)
[name]              ACCOUNT NO.:  [id number]   MEDICAL RECORD NO.:  [id number]          PATIENT TYPE:  INP   LOCATION:  3014                         FACILITY:  [facility]   PHYSICIAN:  Stefani Dama, [name]  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   HISTORY OF PRESENT ILLNESS:  [name] is a 65 year old individual who has  had episodes of back pain with radiation in the buttocks and lower  extremities for at least a year's time.  She had been seen last year in  September and was noted to have severe spondylolisthesis with stenosis at  the L4-L5 level.  Spondylolisthesis was noted to be grade I.  Having failed  extensive efforts at conservative management including trials of epidural  steroid injections, she underwent placement of an X-STOP in October.  Initially, felt that she was doing somewhat better and she notes that she  had received relief of bilateral leg pain.  However, after about four  months, she noted that the pain had returned substantially and she was  having increasing discomfort and distress in her lower extremities.  A  myelogram was performed which demonstrated that the patient had high grade  stenosis at the level of L4-L5.  This was accentuated by her standing.  It  is completely relieved by her being in the supine position on the x-ray  table.  However, because most of her activity when upright seemed to  exacerbate pain substantially.  She was advised regarding formal surgical  decompression arthrodesis.  She did have the largest size X-STOP placed at  the time of her implantation, that being a 14 mm size.   CURRENT MEDICATIONS:  Vitamin E, calcium, aspirin, Benicar,  hydrochlorothiazide, diclofenac, lovastatin,  Effexor, Adair, Spiriva and  albuterol inhalers as needed for asthma.   Note that the patient does not tolerate PREDNISONE well, nor does she  tolerate CODEINE or SULFA, both of which cause nausea.   PAST MEDICAL HISTORY:  1.  The patient has hypertension.  2.  She suffers from asthma. She notes that she has had steroid medications      in the past and does not tolerate this well.   PAST SURGICAL HISTORY:  1.  Carpal tunnel surgery in 2004 and 2005.  2.  Bunion surgery done years ago.   REVIEW OF SYSTEMS:  Notable for wearing of glasses, night sweats, asthma,  leg pain, back pain on a 14-point review.   FAMILY HISTORY:  Her mother is age 67 in poor health with diabetes and high  blood pressure.  Father is deceased.   PHYSICAL EXAMINATION:  GENERAL APPEARANCE:  She is a morbidly obese  individual who stands with a 5 degree forward stoop.  HEENT:  Pupils are 4 mm, briskly reactive to light and accommodation.  Extraocular movements are full and the face is symmetric to grimace.  Tongue  and uvula are in the midline.  Sclerae and conjunctivae are clear.  Face had  sensation.  NECK:  No masses and no bruits are heard.  There is no masses in the  supraclavicular fossae.  LUNGS:  Clear to auscultation.  CARDIOVASCULAR:  Regular rate and rhythm.  ABDOMEN:  Soft and protuberant.  Bowel sounds are positive.  No masses are  palpable.  EXTREMITIES:  No clubbing, cyanosis, or edema.  NEUROLOGIC:  She has pain across her lumbar spine to palpation and  percussion.  Her motor strength revealed some modest weakness in the  tibialis anterior bilaterally, gastrocnemii on the right side is slightly  weak compared to the left side.  This would be graded a 5/6.  Iliopsoas and  quads appears to be intact bilaterally.  Deep tendon reflexes are 2+ in the  patellae, absent in the right Achilles, 2+ in the left Achilles.  Patient  describes pain radiating from the buttock on straight leg raising at 45   degrees in either lower extremities.  Upper extremity strength reveals that  her motor strength is good in the deltoids, biceps, triceps.   IMPRESSION:  The patient has evidence of spondylitic disease in the lower  lumbar spine.  She is now to be admitted to undergo surgical decompression  at the L4-L5 level with stabilization.      Stefani Dama, [name]  Electronically Signed     HJE/MEDQ  D:  01/09/2006  T:  01/10/2006  Job:  [id number]

## 2011-02-25 NOTE — Consult Note (Signed)
NAME:  [name], [name] NO.:  [id number]   MEDICAL RECORD NO.:  [id number]          PATIENT TYPE:  EMS   LOCATION:  MAJO                         FACILITY:  [facility]   PHYSICIAN:  Stacie Glaze, [name] LHCDATE OF BIRTH:  [birth year]   DATE OF CONSULTATION:  DATE OF DISCHARGE:  02/23/2005                                   CONSULTATION   CONSULTING PHYSICIAN:  Stacie Glaze, [name] [facility]   HISTORY OF PRESENT ILLNESS:  The patient is a 65 year old white female with  a history of asthmatic bronchitis who was recently hospitalized  approximately one month ago for a flare of her asthma, who presented at the  urgent care associated with the emergency room with acute shortness of  breath for 24 hours.  She states that she was sent home from work due to  shortness of breath and an increased pulse.  In the urgent care section, she  was treated with albuterol treatments x 2, given a dose of Solu-Medrol, of  which I assume to be 80 mg, and peak flows were obtained.  Although her sats  were 91% on room air, her peak flow was markedly reduced and she was  referred to the emergency room for evaluation.  In the emergency room, her  room air blood gas revealed a pH of 7.5, a pCO2 of 34.9, and a PaO2 of 65.0  on room air.  She had a white count of 15.1, after a dose of her prednisone,  a hemoglobin of 14.3, and a hematocrit of 41.6.  She did not have a left  shift at this time.  Her chemistries were essentially normal.  A group A  strep screen was negative and a chest x-ray was unchanged from a prior film  obtained during her last admission.  A BNP was slightly elevated at 120.2.  During her stay at the emergency room, she did spike a temperature to 101.3,  however, her respirations were controlled and her pulse was reduced after  she was placed on O2.  Sats were between 93-95% on O2 and 91% on room air.  The findings were discussed with the patient and because of the stability of  her  condition, it was decided in agreement with the patient and her husband  that she could be treated initially as an outpatient and should outpatient  therapy fail, it would require an admission.   She will be placed on:  1.  Avelox 400 mg p.o. every day for seven days for possible bacterial      versus viral bronchitis.  2.  Medrol on a dose burst and taper over ten days.  3.  She will be given a prescription for __________  and Jerilynn Som to      control her cough.  4.  She will continue all home medications and her albuterol treatments      should be used q.4h.   She has an appointment with Dr. Jacinta Shoe at 8:30 in the a.m. at the  [facility] office who will assess her further and decide if outpatient  therapy  has been successful.      JEJ/MEDQ  D:  02/23/2005  T:  02/24/2005  Job:  [id number]   cc:   Rosalyn Gess. Norins, [name] [facility]

## 2011-02-25 NOTE — H&P (Signed)
[name], [name] NO.:  [id number]   MEDICAL RECORD NO.:  [id number]          PATIENT TYPE:  EMS   LOCATION:  MAJO                         FACILITY:  [facility]   PHYSICIAN:  Gordy Savers, [name] LHCDATE OF BIRTH:  September 06, 1946   DATE OF ADMISSION:  12/12/2004  DATE OF DISCHARGE:                                HISTORY & PHYSICAL   CHIEF COMPLAINT:  Shortness of breath.   HISTORY OF PRESENT ILLNESS:  The patient is a 65 year old white female with  a history of chronic asthma. She was alone until approximately ten days ago  when she had the onset of chest congestion and cough. She was seen by her  primary care physician and treated with Ceftin and a prednisone Dosepak. She  initially improved, but subsequently developed some episodes of itching and  swelling. She took Benadryl with improvement. Two days ago she developed  worsening chest tightness and shortness of breath, and was evaluated in the  emergency department. She was treated aggressively for asthma over several  hours and with nebulizer treatments improved and was discharged home.  Evaluation included a helical CT scan that was negative for acute pulmonary  emboli. It did reveal, however, a slight increase in size in a right upper  lobe lesion. It revealed other bilateral pulmonary nodules and some  borderline mediastinal adenopathy. White blood cell count at that time was  18,000 and a blood sugar was 175.  The patient did well yesterday, but last  night developed increasing shortness of breath. She has had intermittent  fever and chills. Due to worsening shortness of breath she presents again to  the emergency room for evaluation. Clinical evaluation revealed her to have  mild to moderate hypoxemia with PO2 of 92 on supplemental oxygen. She had a  resting pulse rate of 112. She is now admitted for decompensated asthmatic  bronchitis.   PAST MEDICAL HISTORY:  History of chronic asthma as well  hypertension,  hypercholesterolemia, and exogenous obesity. She was last hospitalized in  October 2004 for atypical chest pain. A follow-up Cardiolite stress test was  reportedly normal. She also states that she has had a 2-D echocardiogram in  the past. During that hospital admission, a right apical lung lesion was  noted on CT scan. She was hospitalized in 1990 for pneumonia and has had a  remote tonsillectomy. There is a history of IBS and in December 2001  underwent right medial nerve decompression for carpal tunnel syndrome. She  has SULFA and CODEINE allergies. Medical regimen includes Ceftin 250 b.i.d.  and a tapering dose of Sterapred. Chronic medications include Advair,  albuterol, Effexor, diclofenac, lovastatin, Benicar (unclear whether this  includes hydrochlorothiazide), and calcium and vitamin D supplementation.  Please see chart for details of her past medical history and family history.   PHYSICAL EXAMINATION:  GENERAL: An obese white female who appeared slightly  ill, but in no acute distress.  VITAL SIGNS: Resting tachycardia with a rate of 112, oxygen saturation 92 on  supplemental oxygen, afebrile, blood pressure 130/84.  SKIN: Warm and dry.  HEENT/NECK: Normal fundi. Conjunctiva slightly  injected. Ears, nose, and  throat unremarkable. Mucous membranes are pink and well hydrated.  CHEST: Scattered rhonchi and expiratory wheezing diffusely.  CARDIOVASCULAR: Grade 2-3/6 systolic ejection murmur heard diffusely, but  loudest at the base.  ABDOMEN: Obese, soft, and nontender.  EXTREMITIES: No significant edema, possibly trace. Peripheral pulses are  full.   IMPRESSION:  Decompensated asthmatic bronchitis.   ADDITIONAL DIAGNOSES:  1.  Hypertension.  2.  Hypercholesterolemia.  3.  Exogenous obesity.  4.  Stress hyperglycemia.  5.  Rule out bacteremia with history of fever and chills.  6.  Bilateral pulmonary nodules with borderline mediastinal adenopathy.  7.   Systolic murmur.   DISPOSITION:  The patient will be admitted to the hospital for pulmonary  toilet. She received nebulizer treatments, expectorants, and following blood  and urine cultures will be placed on Avelox. A hemoglobin A1C will be  evaluated.      PFK/MEDQ  D:  12/12/2004  T:  12/12/2004  Job:  [id number]

## 2011-02-25 NOTE — Assessment & Plan Note (Signed)
[facility]                             PULMONARY OFFICE NOTE   [name], [name]                     MRN:          [id number]  DATE:01/04/2007                            DOB:          1946/04/11    HISTORY:  A 65 year old white female without reports herself being  between 60-70% improved after being treated for exacerbation of COPD.  The patient had been maintained on Advair 250/50 b.i.d. and Spiriva, as  well as Singulair, with minimum chronic symptoms before acute  exacerbation 10 days prior to admission on March 20.  She did not have  purulent sputum, had already been given Medrol by Dr. Debby Bud as well as  erythromycin and instructed to stop Advair, a recommendation I agreed  with considering that most of her symptoms were upper airway in nature.   She has been using a home nebulizer with albuterol with good benefit but  having no nocturnal awakening or need for albuterol during this time.   PHYSICAL EXAMINATION:  On physical examination, she is a pleasant  ambulatory white female with a slightly hoarse phonation.  She had  stable vital signs.  HEENT:  Moderate for strength.  Oropharynx is clear.  LUNG FIELDS:  Few indo and expiratory rhonchi, with adequate air  movement.  CARDIAC:  Regular rhythm without murmurs, rubs or gallops.  ABDOMEN:  Soft, benign.  EXTREMITIES:  Warm without calf tenderness, cyanosis or clubbing.   IMPRESSION:  Acute exacerbation of COPD with no definite purulent sputum  at present and minimum true wheezing.  She is now tapering off Medrol  and using high-dose PPI therapy to prevent reflux from coughing (she has  the body habitus typical of a patient who has at least secondary if not  primary reflux).   At this point, I am going to recommend she restart the Advair and see if  she can switch the albuterol back to p.r.n. only use.   Having reviewed her record today which is mostly consistent with COPD, I  question whether or not she should be maintained on Singulair but I will  leave this for future consideration.   I did spend extra time teaching her how to inhale Advair more  effectively to reduce the deposition in the upper airway and perhaps  reduce the risk of exacerbation of cough, her primary complaint at  present.     [name]. Sherene Sires, MD, [facility]  Electronically Signed   MBW/MedQ  DD: 01/04/2007  DT: 01/04/2007  Job #: [id number]   cc:   Rosalyn Gess. Norins, MD

## 2011-02-25 NOTE — Op Note (Signed)
[name], SALTSMAN              ACCOUNT NO.:  [id number]   MEDICAL RECORD NO.:  [id number]          PATIENT TYPE:  INP   LOCATION:  2899                         FACILITY:  [facility]   PHYSICIAN:  Stefani Dama, [name]  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   PROCEDURE:  Insertion of X STOP L4-L5.   SURGEON:  Stefani Dama, [name]   ANESTHESIA:  General endotracheal.   INDICATIONS:  The patient is a 65 year old white female who has had  significant problems with back and bilateral leg pain, particularly worse  when she stands.  She had typical symptoms of neurogenic claudication and  her MRI demonstrated she had spondylitic degeneration with anterolisthesis  of L4 and L5 and some evidence of bilateral facette disease causing lateral  recess stenosis that was quite significant.  She has been advised regarding  surgical decompression and an X STOP procedure has been proposed.   DESCRIPTION OF PROCEDURE:  The patient was brought to the operating room  supine on the stretcher.  After smooth induction of general endotracheal  anesthesia, she was turned prone.  Back was prepped with DuraPrep and draped  in a sterile fashion. Using fluoroscopic guidance, L4-5 was localized. The  skin above this area was infiltrated with 10 mL of lidocaine 1% with  1:100,000 epinephrine and mixed 50/50 with 0.5% Marcaine.  The incision was  made and this was carried down to the lumbar dorsal fascia which was then  opened on either side of midline once the L5 spinous process was positively  identified. The fascia and the submuscular tissues in this area were  infiltrated with a total of 20 mL of 0.5% Sensorcaine.  Subperiosteal  dissection was then performed along the interlaminar space at L4 and L5.  Then using a small awl, interspinous ligament was punctured and fluoroscopic  localization was used to verify that.  In fact, this was the interspinous  space at L4-5.  A larger awl was then inserted and worked up and down the  ligament and then the interspinous spreader was inserted and gradual  distraction was performed in the interspinous space. The space was  distracted to the 16 mm size.  A 14 mm implant was then chosen.  After  allowing some ligamentous laxity to occur, the 14 mm X STOP was inserted  from the right side to the left side.  The opposite side wing was applied  and tightened in its closest proximity.  The wrenches were removed and final  localizing radiographs identified good position of the X STOP in  the  interspinous space at L4-L5.  Then the lumbodorsal  fascia was closed with 2-0 Vicryl in interrupted fashion, 2-0 Vicryl was  used subcutaneously and subcuticularly and 3-0 Vicryl was used for the final  subcuticular closure. Dermabond was placed on the skin. Blood loss estimated  less than 50 mL. The patient tolerated procedure well.      Stefani Dama, [name]  Electronically Signed     HJE/MEDQ  D:  07/26/2005  T:  07/26/2005  Job:  [id number]   cc:   Rosalyn Gess. Norins, [name] [facility]  [address]  [place]  [place] [address]

## 2011-02-25 NOTE — Discharge Summary (Signed)
NAMELAWRENCE, ROLDAN              ACCOUNT NO.:  [id number]   MEDICAL RECORD NO.:  [id number]          PATIENT TYPE:  INP   LOCATION:  3017                         FACILITY:  [facility]   PHYSICIAN:  Rosalyn Gess. Norins, [name] [facility] OF BIRTH:  24-Apr-1946   DATE OF ADMISSION:  05/19/2005  DATE OF DISCHARGE:  05/24/2005                                 DISCHARGE SUMMARY   ADMISSION DIAGNOSIS:  Acute exacerbation of chronic obstructive pulmonary  disease.   DISCHARGE DIAGNOSIS:  Acute exacerbation of chronic obstructive pulmonary  disease.   HISTORY OF PRESENT ILLNESS:  Mrs. Fair is a 65 year old married white  female with a history of asthmatic bronchitis with multiple  hospitalizations. She was seen in the office May 17, 2005 for recurrent  symptoms of cough and wheezing. At the office examination, O2 saturation was  94% on room air but she had diffuse expiratory wheezing. No fever and no  increase work of breathing. The patient was given 1 gm of Rocephin IM and  125 mg Solu-Medrol IM. She was started on a Medrol burst and taper the  following day and to continue antibiotics with Ceftin 500 mg b.i.d.   The patient did well on May 18, 2005, and on the day of admission, she  called reporting increased wheezing and shortness of breath and was  subsequently admitted for IV steroids and oxygen therapy.   See the H&P for past medical history, family history, social history, and  examination.   HOSPITAL COURSE:  Problem 1:  PULMONARY:  The patient was continued on  Ceftin 500 mg b.i.d. She was started on Solu-Medrol 80 mg q.8h., oxygen  support, and Albuterol breathing treatments. Chest x-ray was performed which  raised the question of a right middle lobe infiltrate. The patient continued  on this regimen and with slight and slow improvement. She was able to be  weaned down to Solu-Medrol 80 mg q.12h. on the 13th. She was taken off of IV  fluids, continued on Ceftin. Follow-up chest  x-ray revealed bronchitic  changes but no infiltrate. The patient continued to progress well, and on  May 23, 2005 was reduced to Solu-Medrol 40 mg b.i.d. and taken off of  oxygen.   On the day of discharge, the patient was doing well. Examination per Sandford Craze, N.P., revealed a heart rate of 92, temperature 97.8, O2  saturation on room air was 94%. The patient had good air movement. She had  soft bilateral expiratory wheezing. The patient was at this point on oral  antibiotics and was felt to be stable to be switched to p.o. steroids and  discharged home.   Problem 2:  HYPERTENSION:  The patient's blood pressure medications were  continued at home and she remained stable.   Problem 3:  HYPERLIPIDEMIA:  The patient was to resume cholesterol treatment  at discharge.   DISCHARGE MEDICATIONS:  1.  The patient was to resume all of her home medications.  2.  She was to take Ceftin 500 mg b.i.d. for an additional five days.  3.  She is to use Albuterol inhaler 2 puffs  q.6h. p.r.n.  4.  She was to continue Advair 250/50 mg b.i.d.  5.  Singulair 10 mg daily.  6.  Spiriva 18 mcg inhalations daily.  7.  The patient was to be on a steroid taper using Medrol 16 mg b.i.d. for 2      days, 8 mg b.i.d. for 2 days, 4 mg b.i.d. for 6 days, and then 4 mg      daily for 6 days.   I did talk to the patient at home after she was discharged to convey these  instructions which was different from her discharge instruction sheet.   The patient is to be seen in the office in two weeks for follow-up or sooner  on an as needed basis. The patient's condition at the time of discharge  dictation was stable and follow-up phone call to the home indicated that she  remained stable.           ______________________________  Rosalyn Gess. Norins, [name] [facility]     MEN/MEDQ  D:  05/24/2005  T:  05/24/2005  Job:  [id number]   cc:   Rosalyn Gess. Norins, [name] [facility]  [address]  [place]  [place] [address]

## 2011-02-25 NOTE — Op Note (Signed)
NAMECHRISTEE, [name]              ACCOUNT NO.:  [id number]   MEDICAL RECORD NO.:  [id number]          PATIENT TYPE:  INP   LOCATION:  3014                         FACILITY:  [facility]   PHYSICIAN:  Stefani Dama, [name]  DATE OF BIRTH:  Feb 24, 1946   DATE OF PROCEDURE:  01/09/2006  DATE OF DISCHARGE:                                 OPERATIVE REPORT   PREOPERATIVE DIAGNOSIS:  Spondylolisthesis, stenosis, L4-5, status post X  STOP placement.   POSTOPERATIVE DIAGNOSIS:  Spondylolisthesis, stenosis, L4-5, status post X  STOP placement.   PROCEDURE:  Bilateral diskectomy and a posterior lumbar interbody fusion  with PEEK spacers, autograft and allograft arthrodesis, with  NutekOsteocelnonsegmental fixation, L4-5.   SURGEON:  Stefani Dama, [name]   FIRST ASSISTANT:  Hilda Lias, [name]   INDICATIONS:  [name] is a Frann Rider 65 year old individual who has had  significant spinal stenosis secondary to spondylolisthesis at the L4-5  level.  About six months ago she underwent placement of an X STOP.  She,  however, failed to get significant relief of her lower extremity pain and  has been dealing with symptoms of neurogenic claudication since that time.  She was advised that further surgical intervention would require  decompression and stabilization.  She is taken to the operating room for  that procedure.   PROCEDURE:  The patient was brought to the operating room supine on the  stretcher after.  After the smooth induction of general endotracheal  anesthesia she was turned prone and the back was prepped with DuraPrep and  draped in a sterile fashion.  An elliptical incision was made around her  previous scar and this tissue was excised.  Dissection was then carried down  to the lumbar dorsal fascia, which was opened on either side of midline to  expose the previously-placed X STOP.  This had been verified at the level of  L4-5 by preoperatively-obtained radiographs.  The X STOP was  removed and  then the interlaminar spaces at L4 and L5 were decorticated and  subperiosteal dissection was taken out to the facet joints at the L4-L5  joint.  Then using a high-speed drill and a 3-mm dissecting tool, the  inferior margin of the lamina out to and including the entirety of the facet  joint was removed on the right and left side.  The yellow ligament in this  area was noted be severely thickened and redundant, and there were rests of  a whitish grumous material that was consistent with previous epidural  steroid injections in this area.  This was all excised.  The common dural  tube was explored and decompressed.  The lamina of L4 was then undercut  substantially to allow for good decompression.  The L4 nerve root was  identified superiorly, and this was decompressed in its lateral passage.  Then the disk space was isolated and it was entered with a 15 blade and a  combination of Kerrison rongeurs was used to evacuate substantially  degenerated disk material from within the L4-5 disk space.  Then a complete  diskectomy was performed first on the right  side, then a similar procedure  was carried out on the left side.  Once the diskectomy was completed, the  endplates were decorticated using a combination of curettes and periosteal  elevators and osteotomes.  The interspace was then sized for appropriate  placement of an interbody spacer and it was felt that a 12 mm spacer would  fit nicely into the interspace.  A broach was then used to cut a passage  into the interspace and once this passage was cut, some cut bone was moved  aside and then the interspace was filled with a mixture of the patient's  autograft that was obtained from the facetectomy mixed with Osteocel and  alpha grain allograft.  This was packed into the disk space first, then into  the PLIF spacers.  This was first done the right side and then on the left  side.  Then fluoroscopy was brought into the field and  lateral images were  obtained.  Pedicle entry sites were chosen L4 and L5 and at L4, 6.5 x 45 mm  pedicle screws were inserted, first by verifying passage of a probe, tapping  to a 6.5 mm size and then by making sure manually that there was no evidence  of cut-out.  Pedicle screws were placed into L4 and similar pedicle screws  were placed into L5.  Standard-size 40 mm rods were used to interconnect the  screws, and this was done in the neutral position.  Finally the lateral  recesses were cleared and the paths of the L5 and the L4 nerve roots were  assuredly decompressed, and the remainder of bone graft was packed into the  lateral gutters between the pedicle screws at L4-L5.  With this, then  hemostasis in the soft tissue was obtained.  The lumbar dorsal fascia was  reapproximated with #1 Vicryl in interrupted fashion, 2-0 Vicryl was used in  the subcutaneous tissues, 3-0 Vicryl subcuticularly, and ultimately  Dermabond was used on the skin.  Blood loss for the procedure was estimated  300 mL.  The patient tolerated the procedure well and was returned to the  recovery room in stable condition.      Stefani Dama, [name]  Electronically Signed     HJE/MEDQ  D:  01/09/2006  T:  01/09/2006  Job:  [id number]

## 2011-02-25 NOTE — Op Note (Signed)
[place]  Patient:    [name], [name]                     MRN: [id number] Proc. Date: 09/13/00 Adm. Date:  [id number] Attending:  Kendell Bane                           Operative Report  PREOPERATIVE DIAGNOSIS:  Right carpal tunnel syndrome.  POSTOPERATIVE DIAGNOSIS:  Right carpal tunnel syndrome.  OPERATION PERFORMED:  Decompression median nerve, right carpal tunnel.  SURGEON:  Lowell Bouton, [name]  ANESTHESIA:  0.5% Marcaine local with sedation.  OPERATIVE FINDINGS:  The patient had a very narrow carpal canal.  There were no masses present and the motor branch was intact.  DESCRIPTION OF PROCEDURE:  Under 0.5% Marcaine local anesthesia with a tourniquet on the right arm, the right hand was prepped and draped in the usual fashion and after exsanguinating the limb, the tourniquet was inflated to 250 mmHg.  A 3 cm longitudinal incision was made in the palm in the thenar crease just ulnar to the the thenar eminence.  Sharp dissection was carried through the subcutaneous tissues and bleeding points were coagulated.  Blunt dissection was carried through the superficial palmar fascia distal to the transverse carpal ligament.  A hemostat was then placed in the carpal canal up against the hook of the hamate and the transverse carpal ligament was divided on the ulnar border of the median nerve.  The proximal end of the ligament was divided  with the scissors after dissecting the nerve away from the undersurface of the ligament.  The nerve was found to be adherent to the undersurface of the ligament and an external epineurolysis was performed.  The nerve was examined and the motor branch was identified.  No masses were identified in the carpal tunnel.  The wound was then irrigated with saline. The tourniquet was released prior to closing the skin.  The skin was closed with 4-0 nylon sutures.  Sterile dressings were applied  followed by a volar wrist splint.  The patient tolerated the procedure well and went to the recovery room awake and stable in good condition. DD:  09/14/00 TD:  09/14/00 Job: [id number]

## 2011-02-25 NOTE — H&P (Signed)
[name], [name]              ACCOUNT NO.:  [id number]   MEDICAL RECORD NO.:  [id number]          PATIENT TYPE:  INP   LOCATION:  3017                         FACILITY:  [facility]   PHYSICIAN:  Rosalyn Gess. Norins, [name] [facility] OF BIRTH:  [birth year]   DATE OF ADMISSION:  05/19/2005  DATE OF DISCHARGE:                                HISTORY & PHYSICAL   The patient seen at 2030 hours.   CHIEF COMPLAINT:  Increased wheezing, shortness of breath, and failing  outpatient therapy.   HISTORY OF PRESENT ILLNESS:  [name] is a 65 year old, married, white  female who has a history of asthmatic bronchitis with multiple  hospitalizations in the past. She was seen in the office May 17, 2005 for  recurrent symptoms of cough and wheezing. At the office exam, she had an O2  saturation of 94% on room air, diffuse expiratory wheezing, no fever and no  increased work of breathing. [name] was given 1 g of Rocephin IM and  125 mg of Solu-Medrol IM in the office. She was to start on a prednisone  burst and taper on the following day at 40 mg of prednisone q.d. x3 and was  continued on antibiotics with Ceftin 500 mg b.i.d. [name] reports she  did well on May 18, 2005 but called this morning to report she had  increasing wheezing and shortness of breath. She is now admitted for IV  steroids and oxygen therapy.   PAST MEDICAL HISTORY:  Surgical:  1.  Tonsillectomy.  2.  The patient did have decompression of right carpal tunnel median nerve      syndrome by Dr. Metro Kung in 2001.   Medical:  1.  Usual childhood diseases.  2.  Recurrent bronchitis.  3.  History of pneumonias in the past.  4.  Carpal tunnel syndrome.  5.  Sinusitis.  6.  Irritable bowel syndrome.  7.  Hypertension.  8.  Significant degenerative joint disease with severe sciatica.  9.  Mild depression.   MEDICATIONS ON ADMISSION:  1.  Effexor 75 mg daily.  2.  Advair 250/50 one inhalation a.m. and h.s.  3.   Cartia HCT 40/12.5 one q.d.  4.  Lovastatin 40 mg daily.  5.  Diclofenac 75 mg b.i.d.  6.  Singulair 10 mg q.d.   FAMILY HISTORY:  Significant for coronary artery disease, peptic ulcer  disease, hypertension in her mother.   SOCIAL HISTORY:  The patient works for [employer]. She is married. She  has one son.   REVIEW OF SYSTEMS:  Negative except for the HPI. Specifically, no chest  pain, chest discomfort. No GI symptoms.   ADMISSION PHYSICAL EXAM:  VITAL SIGNS:  Temperature 97.5, blood pressure  126/74, pulse 74, respirations 20. O2 saturation 92% on 2 liters.  GENERAL APPEARANCE:  This is an obese, Caucasian female sitting up in a  chair with no increased work of breathing.  HEENT:  Normocephalic, atraumatic and unremarkable.  NECK:  Supple without thyromegaly.  NODES:  No adenopathy was noted in the cervical or supraclavicular regions.  CHEST:  The patient had  good breath sounds. She had faint expiratory  wheezes. She had no rhonchi, no rales.  CARDIOVASCULAR:  2+ radial pulses. Precordium was quiet. Her heart sounds  were distant but regular without murmurs.  BREASTS:  Deferred to outpatient GYN.  ABDOMEN:  Obese with positive bowel sounds. No guarding or rebound was  noted.  PELVIC/RECTAL:  Deferred to outpatient GYN.  EXTREMITIES:  Without clubbing, cyanosis, or edema.  NEUROLOGICAL:  Nonfocal.   DATABASE:  Hemoglobin 15.6, white count was 23,600 with 89% segs, 4% lymphs,  7% monocytes. Chemistry with a potassium of 3.7, glucose 118, creatinine  1.0.   Chest x-ray revealed COPD, possible new right middle lobe infiltrate.   ASSESSMENT/PLAN:  1.  Pulmonary. The patient with asthmatic chronic obstructive pulmonary      disease in the past with exacerbations. Now presents having failed      outpatient therapy with a possible new right middle lobe pneumonia.      Plan:  Continue Ceftin 500 mg b.i.d. p.o. Add Biaxin XL 500 mg b.i.d.      p.o. Solu-Medrol 80 mg IV q.8h.  until wheezing has cleared, then slowly      taper. Oxygen at 2 liters. Will continue Advair. Will continue      Singulair. Will add Albuterol hand-held nebulizer treatments four times      daily to help mobilize secretions as well as for additional      bronchodilation.  2.  Hypertension. The patient will be continued on her home medications.  3.  Lipids. The patient is continued on lovastatin as at home.  4.  Degenerative joint disease. Patient with significant degenerative joint      disease affecting her spine with her sciatica currently in remission      under good control. Plan:  Continue diclofenac 75 mg b.i.d. While in      hospital, also treat her with Protonix 40 mg p.o. q.d.   SUMMARY:  A pleasant woman with recurrent exacerbation of her chronic lung  disease with a possible right middle lobe pneumonia. Anticipate a three to  four day hospitalization.        MEN/MEDQ  D:  05/19/2005  T:  05/20/2005  Job:  [id number]

## 2011-02-25 NOTE — Procedures (Signed)
NAMELEKITA, KEREKES NO.:  [id number]   MEDICAL RECORD NO.:  [id number]          PATIENT TYPE:  OUT   LOCATION:  SLEEP CENTER                 [facility]ACILITY[facility]   PHYSICIAN:  Marcelyn Bruins, [name] [facility] DATE OF BIRTH:  15-Sep-1946   DATE OF STUDY:  02/08/2005                              NOCTURNAL POLYSOMNOGRAM   REFERRING PHYSICIAN:  Dr. Illene Regulus   DATE OF STUDY:  Feb 08, 2005   INDICATION FOR THE STUDY:  Hypersomnia with sleep apnea. Epworth score: 12.   SLEEP ARCHITECTURE:  The patient had a total sleep time of 350 minutes with  decreased slow wave sleep and REM. Sleep onset latency was normal at 20  minutes and REM onset was very prolonged at 380 minutes.   IMPRESSION:  1.  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 Events were not positional but most of the      events did occur during the last rapid eye movement period of the study.      The patient did not meet split-night criteria due to the small numbers      of events in the first part of the study.  2.  Moderate to loud snoring noted throughout.  3.  No clinically significant cardiac arrhythmias.      KC/MEDQ  D:  02/14/2005 16:56:37  T:  02/14/2005 18:21:08  Job:  [id number]

## 2011-02-25 NOTE — Assessment & Plan Note (Signed)
You are in the middle of a copd attack HAve nebulizer xopenex and IM steroid depot medrol 80mg  IM x 1 Take doxycycline and methyl pred taper as scheduled Monitor if recurrent exacerbator, if so consider darilesp

## 2011-02-25 NOTE — Discharge Summary (Signed)
NAME:  [name], [name]                        ACCOUNT NO.:  [id number]   MEDICAL RECORD NO.:  [id number]                   PATIENT TYPE:  INP   LOCATION:  3737                                 FACILITY:  [facility]   PHYSICIAN:  Georgina Quint. Plotnikov, [name] [facility]      DATE OF BIRTH:  Jul 15, 1946   DATE OF ADMISSION:  07/10/2003  DATE OF DISCHARGE:  07/11/2003                                 DISCHARGE SUMMARY   DISCHARGE DIAGNOSES:  1. Atypical chest pain.  2. Spiculated lesion in the right apex of the lung.  3. Thoracic back pain.  4. Hypertension.   HISTORY OF PRESENT ILLNESS:  The patient is a 65 year old, white female who  describes sudden onset of substernal chest pain with radiation to her back  on the Saturday prior to this admission.  She described the pain as sharp  and penetrating.  She rated the pain as a 6/10.  Over the next several days,  the patient had continued pain with occasional exacerbation.  She had not  had any relief with over-the-counter GI medications.  The patient was  admitted for further evaluation.   PAST MEDICAL HISTORY:  1. Status post tonsillectomy.  2. Recurrent bronchitis.  3. History of pneumonia in 1990.  4. Asthmatic chronic obstructive pulmonary disease.  5. History of carpal tunnel syndrome.  6. History of sinusitis.  7. Irritable bowel syndrome.   HOSPITAL COURSE:  Problem 1.  ATYPICAL CHEST PAIN:  The patient was admitted  with atypical chest pain.  At this time, it does not appear to be cardiac in  nature and one set of cardiac enzymes were negative.  Her EKG was normal.  Therefore, no further cardiac workup is planned.  However, we will pursue an  outpatient stress test.   Problem 2.  PULMONARY:  Part of the patient's workup included a chest x-ray  and I believe a CT of the chest which was done at [facility]  before her admission to [facility].  Reportedly, she has a spiculated lesion  in the right apex.  This has been discussed  with Dr. Sung Amabile who will see  the patient today.  His plan is to review her films and talk with the  patient and then lay out a plan for further outpatient evaluation.  He did  not feel she need to risk stay in the hospital.   Problem 3.  BACK PAIN:  The patient is complaining of thoracic back pain.  Plain films of the thoracic spine have been ordered.  Our concern is that  this is perhaps orthopedic versus metastatic.  Currently, the patient is on  Vicodin with some relief of her pain.   DISCHARGE MEDICATIONS:  1. Singulair 10 mg q.d.  2. Advair 250/50 b.i.d.  3. Effexor 75 mg q.d.  4. Benicar HCT 40/12.5 mg q.d.  5. Protonix 40 mg q.d. x30 day trial.  6. Vicodin 5/500 one tablet q.6h. p.r.n.  FOLLOW UP:  She is to follow up on Wednesday, October 6, at 8:15 a.m. for an  adenosine Cardiolite and Dr. Debby Bud in two weeks.  Otherwise, Dr. Sung Amabile  will discuss her further followup as well.      Cornell Barman, P.A. [facility]                  Aleksei V. Plotnikov, [name] [facility]    LC/MEDQ  D:  07/11/2003  T:  07/11/2003  Job:  [id number]   cc:   Oley Balm. Sung Amabile, [name] [facility]   Rosalyn Gess. Norins, [name] [facility]

## 2011-03-01 ENCOUNTER — Ambulatory Visit (HOSPITAL_COMMUNITY): Payer: Self-pay

## 2011-03-01 ENCOUNTER — Encounter (HOSPITAL_COMMUNITY): Payer: Medicare Other

## 2011-03-03 ENCOUNTER — Encounter (HOSPITAL_COMMUNITY): Payer: Medicare Other

## 2011-03-03 ENCOUNTER — Ambulatory Visit (HOSPITAL_COMMUNITY): Payer: Self-pay

## 2011-03-08 ENCOUNTER — Ambulatory Visit (HOSPITAL_COMMUNITY): Payer: Self-pay

## 2011-03-08 ENCOUNTER — Encounter (HOSPITAL_COMMUNITY): Payer: Medicare Other

## 2011-03-10 ENCOUNTER — Ambulatory Visit (HOSPITAL_COMMUNITY): Payer: Self-pay

## 2011-03-10 ENCOUNTER — Encounter (HOSPITAL_COMMUNITY): Payer: Medicare Other

## 2011-03-15 ENCOUNTER — Encounter (HOSPITAL_COMMUNITY): Payer: Medicare Other | Attending: Internal Medicine

## 2011-03-15 ENCOUNTER — Ambulatory Visit: Payer: Medicare Other | Admitting: Internal Medicine

## 2011-03-15 ENCOUNTER — Ambulatory Visit (HOSPITAL_COMMUNITY): Payer: Self-pay

## 2011-03-15 DIAGNOSIS — E785 Hyperlipidemia, unspecified: Secondary | ICD-10-CM | POA: Insufficient documentation

## 2011-03-15 DIAGNOSIS — J4489 Other specified chronic obstructive pulmonary disease: Secondary | ICD-10-CM | POA: Insufficient documentation

## 2011-03-15 DIAGNOSIS — Z87891 Personal history of nicotine dependence: Secondary | ICD-10-CM | POA: Insufficient documentation

## 2011-03-15 DIAGNOSIS — Z5189 Encounter for other specified aftercare: Secondary | ICD-10-CM | POA: Insufficient documentation

## 2011-03-15 DIAGNOSIS — K219 Gastro-esophageal reflux disease without esophagitis: Secondary | ICD-10-CM | POA: Insufficient documentation

## 2011-03-15 DIAGNOSIS — J984 Other disorders of lung: Secondary | ICD-10-CM | POA: Insufficient documentation

## 2011-03-15 DIAGNOSIS — R011 Cardiac murmur, unspecified: Secondary | ICD-10-CM | POA: Insufficient documentation

## 2011-03-15 DIAGNOSIS — J449 Chronic obstructive pulmonary disease, unspecified: Secondary | ICD-10-CM | POA: Insufficient documentation

## 2011-03-15 DIAGNOSIS — Z8701 Personal history of pneumonia (recurrent): Secondary | ICD-10-CM | POA: Insufficient documentation

## 2011-03-15 DIAGNOSIS — R0602 Shortness of breath: Secondary | ICD-10-CM | POA: Insufficient documentation

## 2011-03-15 DIAGNOSIS — G4733 Obstructive sleep apnea (adult) (pediatric): Secondary | ICD-10-CM | POA: Insufficient documentation

## 2011-03-15 DIAGNOSIS — I10 Essential (primary) hypertension: Secondary | ICD-10-CM | POA: Insufficient documentation

## 2011-03-17 ENCOUNTER — Encounter (HOSPITAL_COMMUNITY): Payer: Medicare Other

## 2011-03-17 ENCOUNTER — Ambulatory Visit (HOSPITAL_COMMUNITY): Payer: Self-pay

## 2011-03-22 ENCOUNTER — Ambulatory Visit (HOSPITAL_COMMUNITY): Payer: Self-pay

## 2011-03-22 ENCOUNTER — Encounter (HOSPITAL_COMMUNITY): Payer: Medicare Other

## 2011-03-23 ENCOUNTER — Telehealth: Payer: Self-pay | Admitting: Internal Medicine

## 2011-03-23 ENCOUNTER — Other Ambulatory Visit: Payer: Self-pay | Admitting: Internal Medicine

## 2011-03-23 DIAGNOSIS — D649 Anemia, unspecified: Secondary | ICD-10-CM

## 2011-03-23 NOTE — Telephone Encounter (Signed)
Received labs from Dr. Alanda Amass: all looks good except for low hemoglobin. Orders entered for further work-up of anemia. Please call and have her come in anytime for lab. Thanks

## 2011-03-24 ENCOUNTER — Other Ambulatory Visit (INDEPENDENT_AMBULATORY_CARE_PROVIDER_SITE_OTHER): Payer: Medicare Other

## 2011-03-24 ENCOUNTER — Ambulatory Visit (HOSPITAL_COMMUNITY): Payer: Self-pay

## 2011-03-24 ENCOUNTER — Encounter (HOSPITAL_COMMUNITY): Payer: Medicare Other

## 2011-03-24 DIAGNOSIS — D649 Anemia, unspecified: Secondary | ICD-10-CM

## 2011-03-24 LAB — IBC PANEL
Iron: 23 ug/dL — ABNORMAL LOW (ref 42–145)
Saturation Ratios: 5.5 % — ABNORMAL LOW (ref 20.0–50.0)
Transferrin: 300.7 mg/dL (ref 212.0–360.0)

## 2011-03-24 NOTE — Telephone Encounter (Signed)
Pt called and informed she will come for labs today or monday

## 2011-03-28 ENCOUNTER — Telehealth: Payer: Self-pay | Admitting: Internal Medicine

## 2011-03-28 DIAGNOSIS — D509 Iron deficiency anemia, unspecified: Secondary | ICD-10-CM | POA: Insufficient documentation

## 2011-03-28 NOTE — Telephone Encounter (Signed)
Pt has not had a colonoscopy in the past five years and would like to see Dr Juanda Chance if poss.

## 2011-03-28 NOTE — Telephone Encounter (Signed)
Please call patient. Iron levels are very low. Need to have colonoscopy if not done in the last 5 years. If she says no colonosocpy in last 5 years will need to put in order.  Thanks

## 2011-03-29 ENCOUNTER — Encounter: Payer: Self-pay | Admitting: Internal Medicine

## 2011-03-29 ENCOUNTER — Encounter (HOSPITAL_COMMUNITY): Payer: Medicare Other

## 2011-03-29 ENCOUNTER — Ambulatory Visit (HOSPITAL_COMMUNITY): Payer: Self-pay

## 2011-03-31 ENCOUNTER — Telehealth: Payer: Self-pay | Admitting: *Deleted

## 2011-03-31 ENCOUNTER — Encounter (HOSPITAL_COMMUNITY): Payer: Medicare Other

## 2011-03-31 ENCOUNTER — Ambulatory Visit (HOSPITAL_COMMUNITY): Payer: Self-pay

## 2011-03-31 NOTE — Telephone Encounter (Signed)
Pt had wanted to see brodie for colon but only b/c she was only MD pt knew of that was GI MD. She would like apt earlier that august. Advised pt to call GI and see if another MD can see pt for eval sooner.

## 2011-03-31 NOTE — Telephone Encounter (Signed)
Patient requesting a call regarding colonoscopy.

## 2011-04-05 ENCOUNTER — Encounter (HOSPITAL_COMMUNITY): Payer: Medicare Other

## 2011-04-05 ENCOUNTER — Ambulatory Visit (HOSPITAL_COMMUNITY): Payer: Self-pay

## 2011-04-07 ENCOUNTER — Encounter (HOSPITAL_COMMUNITY): Payer: Medicare Other

## 2011-04-07 ENCOUNTER — Ambulatory Visit (HOSPITAL_COMMUNITY): Payer: Self-pay

## 2011-04-08 ENCOUNTER — Encounter: Payer: Self-pay | Admitting: Gastroenterology

## 2011-04-08 ENCOUNTER — Ambulatory Visit (INDEPENDENT_AMBULATORY_CARE_PROVIDER_SITE_OTHER): Payer: Medicare Other | Admitting: Gastroenterology

## 2011-04-08 VITALS — BP 160/60 | HR 88 | Ht 68.0 in | Wt 234.0 lb

## 2011-04-08 DIAGNOSIS — D509 Iron deficiency anemia, unspecified: Secondary | ICD-10-CM

## 2011-04-08 MED ORDER — PEG-KCL-NACL-NASULF-NA ASC-C 100 G PO SOLR: 1.0000 | ORAL | Status: DC

## 2011-04-08 NOTE — Patient Instructions (Signed)
You will be set up for a colonoscopy. You will be set up for an upper endoscopy. A copy of this information will be made available to Dr. Debby Bud.

## 2011-04-08 NOTE — Progress Notes (Signed)
HPI: This is a  very pleasant 65 year old woman who was recently found to have asymptomatic anemia, mild iron deficiency. Her hemoglobin was 9.4, her MCV was 81. Her iron level was 24. Her basic metabolic profile is normal.  Went to cardiologist for heart murmor with valve issues, underwent stress testing.  CBC showed Hb 9.4, mcv 81.4.  Never told she was anemic except when she was a very young child.  She has periodic nose bleeds, up to twice a week.  Doesn't feel like she is swallowing the blood. This has been a problem off and on for years.  Never saw ENT MD.  Occasional black colored stools.  Never red bloody stools.  Bowels have changed since dieting, becoming more constipated.  Will take MOM occasionally.    Very rare NSAIDs.   Review of systems: Pertinent positive and negative review of systems were noted in the above HPI section.  All other review of systems was otherwise negative.   Past Medical History  Diagnosis Date  . IBS (irritable bowel syndrome)   . Hyperlipidemia   . Allergic rhinitis   . History of pneumonia   . COPD (chronic obstructive pulmonary disease)   . Asthma   . Heart murmur     Aortic  . Lung nodule     RLL nodule-20mm stable 2006, April 2009, and June 2009  . Candidiasis of breast   . Anemia   . Hyperlipemia   . Obesity   . Unspecified essential hypertension     Past Surgical History  Procedure Date  . Back surgery     2006  . Foot surgery   . Hand surgery      reports that she quit smoking about 36 years ago. Her smoking use included Cigarettes. She has a 15 pack-year smoking history. She has never used smokeless tobacco. She reports that she drinks alcohol. Her drug history not on file.  family history includes Diabetes in an unspecified family member; Heart disease in an unspecified family member; and Lung cancer in her father.    Current Medications, Allergies were all reviewed with the patient via [facility] electronic medical  record system.    Physical Exam: BP 160/60  Pulse 88  Ht 5\' 8"  (1.727 m)  Wt 234 lb (106.142 kg)  BMI 35.58 kg/m2 Constitutional: generally well-appearing, except for obesity Psychiatric: alert and oriented x3 Eyes: extraocular movements intact Mouth: oral pharynx moist, no lesions Neck: supple no lymphadenopathy Cardiovascular: heart regular rate and rhythm Lungs: clear to auscultation bilaterally Abdomen: soft, nontender, nondistended, no obvious ascites, no peritoneal signs, normal bowel sounds Extremities: no lower extremity edema bilaterally Skin: no lesions on visible extremities    Assessment and plan: 65 y.o. female with iron deficiency anemia, intermittent black stools  She has never had colonoscopy or upper endoscopy before. I think we should proceed with both of those tests at her soonest convenience. I see no reason for any further blood tests or imaging studies prior to that.

## 2011-04-10 DIAGNOSIS — C20 Malignant neoplasm of rectum: Secondary | ICD-10-CM

## 2011-04-10 HISTORY — DX: Malignant neoplasm of rectum: C20

## 2011-04-12 ENCOUNTER — Encounter (HOSPITAL_COMMUNITY): Payer: Medicare Other

## 2011-04-12 ENCOUNTER — Ambulatory Visit (HOSPITAL_COMMUNITY): Payer: Self-pay

## 2011-04-13 ENCOUNTER — Other Ambulatory Visit: Payer: Self-pay | Admitting: Internal Medicine

## 2011-04-14 ENCOUNTER — Encounter (HOSPITAL_COMMUNITY): Payer: Medicare Other

## 2011-04-14 ENCOUNTER — Ambulatory Visit (HOSPITAL_COMMUNITY): Payer: Self-pay

## 2011-04-19 ENCOUNTER — Encounter (HOSPITAL_COMMUNITY): Payer: Medicare Other

## 2011-04-19 ENCOUNTER — Ambulatory Visit (HOSPITAL_COMMUNITY): Payer: Self-pay

## 2011-04-21 ENCOUNTER — Encounter (HOSPITAL_COMMUNITY): Payer: Medicare Other

## 2011-04-21 ENCOUNTER — Ambulatory Visit (HOSPITAL_COMMUNITY): Payer: Self-pay

## 2011-04-25 ENCOUNTER — Other Ambulatory Visit: Payer: Self-pay | Admitting: Internal Medicine

## 2011-04-26 ENCOUNTER — Ambulatory Visit (HOSPITAL_COMMUNITY): Payer: Self-pay

## 2011-04-26 ENCOUNTER — Encounter (HOSPITAL_COMMUNITY): Payer: Medicare Other

## 2011-04-28 ENCOUNTER — Encounter (HOSPITAL_COMMUNITY): Payer: Medicare Other

## 2011-04-28 ENCOUNTER — Ambulatory Visit (HOSPITAL_COMMUNITY): Payer: Self-pay

## 2011-05-03 ENCOUNTER — Ambulatory Visit (HOSPITAL_COMMUNITY): Payer: Self-pay

## 2011-05-03 ENCOUNTER — Encounter (HOSPITAL_COMMUNITY): Payer: Medicare Other

## 2011-05-05 ENCOUNTER — Other Ambulatory Visit: Payer: Self-pay

## 2011-05-05 ENCOUNTER — Encounter (HOSPITAL_COMMUNITY): Payer: Medicare Other

## 2011-05-05 ENCOUNTER — Ambulatory Visit (HOSPITAL_COMMUNITY): Payer: Self-pay

## 2011-05-10 ENCOUNTER — Ambulatory Visit: Payer: Medicare Other | Admitting: Gastroenterology

## 2011-05-10 ENCOUNTER — Other Ambulatory Visit (INDEPENDENT_AMBULATORY_CARE_PROVIDER_SITE_OTHER): Payer: Medicare Other

## 2011-05-10 ENCOUNTER — Encounter: Payer: Self-pay | Admitting: Gastroenterology

## 2011-05-10 ENCOUNTER — Other Ambulatory Visit: Payer: Self-pay | Admitting: Gastroenterology

## 2011-05-10 ENCOUNTER — Ambulatory Visit (AMBULATORY_SURGERY_CENTER): Payer: Medicare Other | Admitting: Gastroenterology

## 2011-05-10 ENCOUNTER — Encounter: Payer: Medicare Other | Admitting: Gastroenterology

## 2011-05-10 ENCOUNTER — Encounter (HOSPITAL_COMMUNITY): Payer: Medicare Other

## 2011-05-10 ENCOUNTER — Ambulatory Visit (HOSPITAL_COMMUNITY): Payer: Self-pay

## 2011-05-10 VITALS — BP 164/75 | HR 87 | Temp 99.5°F | Resp 18 | Ht 69.0 in | Wt 230.0 lb

## 2011-05-10 DIAGNOSIS — C20 Malignant neoplasm of rectum: Secondary | ICD-10-CM

## 2011-05-10 DIAGNOSIS — D126 Benign neoplasm of colon, unspecified: Secondary | ICD-10-CM

## 2011-05-10 DIAGNOSIS — D509 Iron deficiency anemia, unspecified: Secondary | ICD-10-CM

## 2011-05-10 LAB — COMPREHENSIVE METABOLIC PANEL
ALT: 15 U/L (ref 0–35)
AST: 20 U/L (ref 0–37)
Albumin: 3.7 g/dL (ref 3.5–5.2)
Alkaline Phosphatase: 77 U/L (ref 39–117)
BUN: 8 mg/dL (ref 6–23)
CO2: 26 mEq/L (ref 19–32)
Calcium: 8.6 mg/dL (ref 8.4–10.5)
Chloride: 107 mEq/L (ref 96–112)
Creatinine, Ser: 0.7 mg/dL (ref 0.4–1.2)
GFR: 90.51 mL/min (ref >60.00)
Glucose, Bld: 87 mg/dL (ref 70–99)
Potassium: 4.1 mEq/L (ref 3.5–5.1)
Sodium: 141 mEq/L (ref 135–145)
Total Bilirubin: 0.6 mg/dL (ref 0.3–1.2)
Total Protein: 6.6 g/dL (ref 6.0–8.3)

## 2011-05-10 MED ORDER — SODIUM CHLORIDE 0.9 % IV SOLN: 500.0000 mL | INTRAVENOUS | Status: DC

## 2011-05-10 NOTE — Progress Notes (Signed)
mhgf

## 2011-05-10 NOTE — Patient Instructions (Addendum)
Our office will arrange a CT scan for you of the abdomen, pelvis with IV and by mouth contrast.  The CT's will be on 05/11/2011 at 1:45pm.  Nothing to drink or eat after 9:45am that morning.  Drink the first contrast bottle at noon, and the second at 1pm.  An endoscopic ultrasound will also be scheduled.   We will also set you up for some special blood work.  We will arrange a consultation with Dr. Mancel Bale for you as well.  The answer to these tests will tell us if you need surgery or chemotherapy.  This process will take about two weeks including the x-rays and tests.  Within the next two months treatments will begin per Dr. Christella Hartigan.   Please, let us know if we can help you in any way.  Call us at [phone] if you have any questions.

## 2011-05-11 ENCOUNTER — Ambulatory Visit (INDEPENDENT_AMBULATORY_CARE_PROVIDER_SITE_OTHER)
Admission: RE | Admit: 2011-05-11 | Discharge: 2011-05-11 | Disposition: A | Discharge status: Home / Self Care | Payer: Medicare Other | Source: Ambulatory Visit | Attending: Gastroenterology | Admitting: Gastroenterology

## 2011-05-11 ENCOUNTER — Telehealth: Payer: Self-pay | Admitting: *Deleted

## 2011-05-11 DIAGNOSIS — C20 Malignant neoplasm of rectum: Secondary | ICD-10-CM

## 2011-05-11 LAB — CEA: CEA: <0.5 ng/mL (ref 0.0–5.0)

## 2011-05-11 MED ORDER — IOHEXOL 300 MG/ML  SOLN
100.0000 mL | Freq: Once | INTRAMUSCULAR | Status: AC | PRN
  Administered 2011-05-11: 100 mL via INTRAVENOUS

## 2011-05-11 NOTE — Telephone Encounter (Signed)

## 2011-05-12 ENCOUNTER — Telehealth: Payer: Self-pay | Admitting: Gastroenterology

## 2011-05-12 ENCOUNTER — Ambulatory Visit (HOSPITAL_COMMUNITY): Payer: Self-pay

## 2011-05-12 ENCOUNTER — Encounter (HOSPITAL_COMMUNITY): Payer: Medicare Other

## 2011-05-12 DIAGNOSIS — C20 Malignant neoplasm of rectum: Secondary | ICD-10-CM

## 2011-05-12 NOTE — Telephone Encounter (Signed)
I spoke with the cancer center and the notes are being reviewed and pt will be contacted with appt.

## 2011-05-12 NOTE — Telephone Encounter (Signed)
i just spoke with her and her husband about ct results, lab results.  She needs lower EUS for rectal cancer staging (tomorrow lunch???), 30 min, flex sig prep.  If not tomorrow lunch then sometime next week lunch if needed.  Also can you check on referrals to Drs Donell Beers and Truett Perna?  thanks

## 2011-05-12 NOTE — Telephone Encounter (Signed)
Dr Christella Hartigan the pt called for her Ct scan result she is very anxious have you reviewed yet?

## 2011-05-12 NOTE — Telephone Encounter (Signed)
Pt scheduled for EUS 05/13/11 noon jackie booking number [phone]  Pt aware and instructed Dr Donell Beers 05/26/11 2:15 pm arrive 1:40 pm   Dr Truett Perna Left message on machine to call back

## 2011-05-13 ENCOUNTER — Encounter: Payer: Medicare Other | Admitting: Gastroenterology

## 2011-05-13 ENCOUNTER — Ambulatory Visit (HOSPITAL_COMMUNITY)
Admission: RE | Admit: 2011-05-13 | Discharge: 2011-05-13 | Disposition: A | Discharge status: Home / Self Care | Payer: Medicare Other | Source: Ambulatory Visit | Attending: Gastroenterology | Admitting: Gastroenterology

## 2011-05-13 DIAGNOSIS — C210 Malignant neoplasm of anus, unspecified: Secondary | ICD-10-CM

## 2011-05-13 DIAGNOSIS — C20 Malignant neoplasm of rectum: Secondary | ICD-10-CM | POA: Insufficient documentation

## 2011-05-16 ENCOUNTER — Encounter: Payer: Medicare Other | Admitting: Gastroenterology

## 2011-05-16 ENCOUNTER — Telehealth: Payer: Self-pay

## 2011-05-16 ENCOUNTER — Ambulatory Visit: Payer: Medicare Other | Admitting: Internal Medicine

## 2011-05-16 NOTE — Telephone Encounter (Signed)
Yes, that is ok 

## 2011-05-16 NOTE — Telephone Encounter (Signed)
Pt aware.

## 2011-05-16 NOTE — Telephone Encounter (Signed)
Dr Christella Hartigan the pt called and said that she has an appt at the cancer center on 05/18/11 but it is with Dr Dalene Carrow not Dr Truett Perna.  Is this ok?

## 2011-05-17 ENCOUNTER — Ambulatory Visit (HOSPITAL_COMMUNITY): Payer: Self-pay

## 2011-05-17 ENCOUNTER — Encounter (HOSPITAL_COMMUNITY): Payer: Medicare Other

## 2011-05-18 ENCOUNTER — Encounter (HOSPITAL_BASED_OUTPATIENT_CLINIC_OR_DEPARTMENT_OTHER): Payer: Medicare Other | Admitting: Hematology and Oncology

## 2011-05-18 ENCOUNTER — Other Ambulatory Visit: Payer: Self-pay | Admitting: Hematology and Oncology

## 2011-05-18 DIAGNOSIS — C20 Malignant neoplasm of rectum: Secondary | ICD-10-CM

## 2011-05-18 DIAGNOSIS — D649 Anemia, unspecified: Secondary | ICD-10-CM

## 2011-05-18 DIAGNOSIS — Z801 Family history of malignant neoplasm of trachea, bronchus and lung: Secondary | ICD-10-CM

## 2011-05-18 LAB — COMPREHENSIVE METABOLIC PANEL
ALT: 10 U/L (ref 0–35)
AST: 16 U/L (ref 0–37)
Albumin: 3.5 g/dL (ref 3.5–5.2)
Alkaline Phosphatase: 81 U/L (ref 39–117)
BUN: 9 mg/dL (ref 6–23)
CO2: 27 mEq/L (ref 19–32)
Calcium: 9.3 mg/dL (ref 8.4–10.5)
Chloride: 105 mEq/L (ref 96–112)
Creatinine, Ser: 0.71 mg/dL (ref 0.50–1.10)
Glucose, Bld: 90 mg/dL (ref 70–99)
Potassium: 3.5 mEq/L (ref 3.5–5.3)
Sodium: 140 mEq/L (ref 135–145)
Total Bilirubin: 0.5 mg/dL (ref 0.3–1.2)
Total Protein: 6.8 g/dL (ref 6.0–8.3)

## 2011-05-18 LAB — CBC WITH DIFFERENTIAL/PLATELET
BASO%: 0.5 % (ref 0.0–2.0)
Basophils Absolute: 0 10*3/uL (ref 0.0–0.1)
EOS%: 2.4 % (ref 0.0–7.0)
Eosinophils Absolute: 0.2 10*3/uL (ref 0.0–0.5)
HCT: 25.9 % — ABNORMAL LOW (ref 34.8–46.6)
HGB: 8.2 g/dL — ABNORMAL LOW (ref 11.6–15.9)
LYMPH%: 14.9 % (ref 14.0–49.7)
MCH: 23.4 pg — ABNORMAL LOW (ref 25.1–34.0)
MCHC: 31.6 g/dL (ref 31.5–36.0)
MCV: 73.8 fL — ABNORMAL LOW (ref 79.5–101.0)
MONO#: 0.9 10*3/uL (ref 0.1–0.9)
MONO%: 11.5 % (ref 0.0–14.0)
NEUT#: 5.4 10*3/uL (ref 1.5–6.5)
NEUT%: 70.7 % (ref 38.4–76.8)
Platelets: 230 10*3/uL (ref 145–400)
RBC: 3.5 10*6/uL — ABNORMAL LOW (ref 3.70–5.45)
RDW: 18.6 % — ABNORMAL HIGH (ref 11.2–14.5)
WBC: 7.7 10*3/uL (ref 3.9–10.3)
lymph#: 1.1 10*3/uL (ref 0.9–3.3)

## 2011-05-18 LAB — RETICULOCYTES (CHCC)
ABS Retic: 40 10*3/uL (ref 19.0–186.0)
RBC.: 3.64 MIL/uL — ABNORMAL LOW (ref 3.87–5.11)
Retic Ct Pct: 1.1 % (ref 0.4–2.3)

## 2011-05-18 LAB — IRON AND TIBC
%SAT: 4 % — ABNORMAL LOW (ref 20–55)
Iron: 14 ug/dL — ABNORMAL LOW (ref 42–145)
TIBC: 348 ug/dL (ref 250–470)
UIBC: 334 ug/dL

## 2011-05-18 LAB — CEA: CEA: <0.5 ng/mL (ref 0.0–5.0)

## 2011-05-18 LAB — FERRITIN: Ferritin: 5 ng/mL — ABNORMAL LOW (ref 10–291)

## 2011-05-19 ENCOUNTER — Ambulatory Visit
Admission: RE | Admit: 2011-05-19 | Discharge: 2011-05-19 | Disposition: A | Discharge status: Home / Self Care | Payer: Medicare Other | Source: Ambulatory Visit | Attending: Radiation Oncology | Admitting: Radiation Oncology

## 2011-05-19 ENCOUNTER — Encounter (HOSPITAL_COMMUNITY): Payer: Medicare Other

## 2011-05-19 ENCOUNTER — Ambulatory Visit: Payer: Medicare Other | Admitting: Radiation Oncology

## 2011-05-19 ENCOUNTER — Encounter (HOSPITAL_BASED_OUTPATIENT_CLINIC_OR_DEPARTMENT_OTHER): Payer: Medicare Other | Admitting: Hematology and Oncology

## 2011-05-19 ENCOUNTER — Ambulatory Visit (HOSPITAL_COMMUNITY): Payer: Self-pay

## 2011-05-19 DIAGNOSIS — R110 Nausea: Secondary | ICD-10-CM | POA: Insufficient documentation

## 2011-05-19 DIAGNOSIS — C20 Malignant neoplasm of rectum: Secondary | ICD-10-CM | POA: Insufficient documentation

## 2011-05-19 DIAGNOSIS — J449 Chronic obstructive pulmonary disease, unspecified: Secondary | ICD-10-CM | POA: Insufficient documentation

## 2011-05-19 DIAGNOSIS — J4489 Other specified chronic obstructive pulmonary disease: Secondary | ICD-10-CM | POA: Insufficient documentation

## 2011-05-19 DIAGNOSIS — D649 Anemia, unspecified: Secondary | ICD-10-CM | POA: Insufficient documentation

## 2011-05-19 DIAGNOSIS — Z510 Encounter for antineoplastic radiation therapy: Secondary | ICD-10-CM | POA: Insufficient documentation

## 2011-05-19 DIAGNOSIS — E785 Hyperlipidemia, unspecified: Secondary | ICD-10-CM | POA: Insufficient documentation

## 2011-05-19 DIAGNOSIS — D509 Iron deficiency anemia, unspecified: Secondary | ICD-10-CM

## 2011-05-24 ENCOUNTER — Encounter (HOSPITAL_COMMUNITY): Payer: Medicare Other

## 2011-05-24 ENCOUNTER — Ambulatory Visit (HOSPITAL_COMMUNITY): Payer: Self-pay

## 2011-05-26 ENCOUNTER — Encounter (HOSPITAL_COMMUNITY)
Admission: RE | Admit: 2011-05-26 | Discharge: 2011-05-26 | Disposition: A | Discharge status: Home / Self Care | Payer: Medicare Other | Source: Ambulatory Visit | Attending: Hematology and Oncology | Admitting: Hematology and Oncology

## 2011-05-26 ENCOUNTER — Encounter (INDEPENDENT_AMBULATORY_CARE_PROVIDER_SITE_OTHER): Payer: Self-pay | Admitting: General Surgery

## 2011-05-26 ENCOUNTER — Encounter (HOSPITAL_COMMUNITY): Payer: Medicare Other

## 2011-05-26 ENCOUNTER — Ambulatory Visit (HOSPITAL_COMMUNITY): Payer: Self-pay

## 2011-05-26 ENCOUNTER — Ambulatory Visit (INDEPENDENT_AMBULATORY_CARE_PROVIDER_SITE_OTHER): Payer: Medicare Other | Admitting: General Surgery

## 2011-05-26 VITALS — BP 138/70 | HR 98 | Temp 97.4°F | Ht 67.0 in | Wt 230.0 lb

## 2011-05-26 DIAGNOSIS — C20 Malignant neoplasm of rectum: Secondary | ICD-10-CM

## 2011-05-26 DIAGNOSIS — R011 Cardiac murmur, unspecified: Secondary | ICD-10-CM

## 2011-05-26 DIAGNOSIS — Z85048 Personal history of other malignant neoplasm of rectum, rectosigmoid junction, and anus: Secondary | ICD-10-CM | POA: Insufficient documentation

## 2011-05-26 DIAGNOSIS — J441 Chronic obstructive pulmonary disease with (acute) exacerbation: Secondary | ICD-10-CM

## 2011-05-26 DIAGNOSIS — D509 Iron deficiency anemia, unspecified: Secondary | ICD-10-CM

## 2011-05-26 HISTORY — DX: Personal history of other malignant neoplasm of rectum, rectosigmoid junction, and anus: Z85.048

## 2011-05-26 LAB — GLUCOSE, CAPILLARY: Glucose-Capillary: 113 mg/dL — ABNORMAL HIGH (ref 70–99)

## 2011-05-26 MED ORDER — FLUDEOXYGLUCOSE F - 18 (FDG) INJECTION
19.9000 | Freq: Once | INTRAVENOUS | Status: AC | PRN
  Administered 2011-05-26: 19.9 via INTRAVENOUS

## 2011-05-26 NOTE — Assessment & Plan Note (Signed)
Starting neoadjuvant treatment Monday august 20. Drs. Moody and Odogwu treating up front. Supposed to finish treatment at end of September.   This would put [place] at an OR target at the end of November/early December. Discussed lap LAR with pt and the reasonable likelihood of needing a temporary ileostomy.

## 2011-05-26 NOTE — Assessment & Plan Note (Signed)
Taking iron supplements Having significant nausea with them.

## 2011-05-26 NOTE — Assessment & Plan Note (Signed)
Will need cardiac clearance for surgery by Dr. Alanda Amass Reportedly has negative stress test

## 2011-05-26 NOTE — Assessment & Plan Note (Signed)
Will need aggressive pulmonary toilet post operatively May need pulmonary consult as inpatient if patient has difficulty post op

## 2011-05-26 NOTE — Progress Notes (Signed)
Chief Complaint  Patient presents with  . Other    rectal cancer    HISTORY: Pt is a 65 yo female who presents with anemia.  She was found to have a worsening heart murmur, which led to her being evaluated by Dr. Alanda Amass.  Labs revealed significant anemia.  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  Staging workup reveals clinical stage III disease.  She has been experiencing some symptoms of mild weight loss, constipation, and dark stools.  She has not had pain, nausea or vomiting.  She is a Agricultural consultant at [employer].  She has not experienced bloating either.  She had been taking milk of magnesia for a laxative.  Past Medical History  Diagnosis Date  . IBS (irritable bowel syndrome)   . Hyperlipidemia   . Allergic rhinitis   . History of pneumonia   . COPD (chronic obstructive pulmonary disease)   . Asthma   . Heart murmur     Aortic  . Lung nodule     RLL nodule-49mm stable 2006, April 2009, and June 2009  . Candidiasis of breast   . Anemia   . Hyperlipemia   . Obesity   . Unspecified essential hypertension   . Heart valve problem     PER CARDIOLOGIST 05-03-2011  . Sleep difficulties   . Shortness of breath   . Hemorrhoids   . Cancer     rectal     Current Outpatient Prescriptions  Medication Sig Dispense Refill  . albuterol (PROVENTIL) (2.5 MG/3ML) 0.083% nebulizer solution Take 2.5 mg by nebulization every 6 (six) hours as needed.        Marland Kitchen albuterol (PROVENTIL,VENTOLIN) 90 MCG/ACT inhaler Inhale 2 puffs into the lungs every 6 (six) hours as needed.        Marland Kitchen aspirin 325 MG EC tablet Take 81 mg by mouth daily.       . Calcium Carbonate-Vitamin D (CALCIUM 600 + D PO) Take by mouth.        . dextromethorphan-guaiFENesin (MUCINEX DM) 30-600 MG per 12 hr tablet Take 1 tablet by mouth daily.        Marland Kitchen diltiazem (CARDIZEM) 120 MG tablet Take 120 mg by mouth 1 dose over 24 hours.        .  Fluticasone-Salmeterol (ADVAIR DISKUS) 100-50 MCG/DOSE AEPB Inhale 1 puff into the lungs every 12 (twelve) hours.        . folic acid (FOLVITE) 1 MG tablet       . losartan (COZAAR) 50 MG tablet Take 50 mg by mouth daily.        Marland Kitchen lovastatin (MEVACOR) 40 MG tablet TAKE 1 TABLET EVERY DAY  30 tablet  1  . mometasone (NASONEX) 50 MCG/ACT nasal spray 2 sprays by Nasal route daily as needed.       . pantoprazole (PROTONIX) 40 MG tablet       . SINGULAIR 10 MG tablet take 1 tablet by mouth at bedtime  30 tablet  1  . tiotropium (SPIRIVA HANDIHALER) 18 MCG inhalation capsule Place 1 capsule (18 mcg total) into inhaler and inhale daily.  30 capsule  1  . prochlorperazine (COMPAZINE) 10 MG tablet Ad lib.      . XELODA 500 MG tablet BID times 48H.       Current Facility-Administered Medications  Medication Dose Route Frequency Provider Last Rate Last Dose  . 0.9 %  sodium chloride infusion  500 mL Intravenous Continuous Rob Bunting, MD       Facility-Administered Medications Ordered in Other Visits  Medication Dose Route Frequency Provider Last Rate Last Dose  . fludeoxyglucose F - 18 (FDG) injection 19.9 milli Curie  19.9 milli Curie Intravenous Once PRN Medication Radiologist   19.9 milli Curie at 05/26/11 0715     Allergies  Allergen Reactions  . Codeine   . Prednisone   . Sulfonamide Derivatives      Family History  Problem Relation Age of Onset  . Lung cancer Father   . Cancer Father     lung  . Diabetes    . Heart disease    . Heart disease Mother   . Diabetes Mother      History   Social History  . Marital Status: Married    Spouse Name: N/A    Number of Children: N/A  . Years of Education: N/A   Social History Main Topics  . Smoking status: Former Smoker -- 1.5 packs/day for 10 years    Types: Cigarettes    Quit date: 12/28/1974  . Smokeless tobacco: Never Used  . Alcohol Use: Yes     rare  . Drug Use: No  . Sexually Active: None   Other Topics Concern  .  None   Social History Narrative  . None     REVIEW OF SYSTEMS - PERTINENT POSITIVES ONLY: Positive for fatigue, shortness of breath, easy bruising, rectal bleeding, and joint pain.     EXAM: Filed Vitals:   05/26/11 1436  BP: 138/70  Pulse: 98  Temp: 97.4 F (36.3 C)    HENT:  Head: Normocephalic and atraumatic.  Eyes: Conjunctivae are normal. Pupils are equal, round, and reactive to light. No scleral icterus.  Neck: Normal range of motion. Neck supple. No tracheal deviation present. No thyromegaly present.  Cardiovascular: Normal rate, regular rhythm, and intact distal pulses.  Exam reveals prominent systolic murmur.   No murmur heard. Respiratory: Effort normal and breath sounds normal. No respiratory distress. No wheezes, rales or rhonchi, no chest wall tenderness.  GI: Soft. Bowel sounds are normal. The abdomen is soft and nontender.  There is no rebound and no guarding.  Musculoskeletal: Normal range of motion. Extremities are nontender.  Lymphadenopathy: No cervical, preauricular, postauricular or axillary adenopathy is present Neurological: Alert and oriented to person, place, and time. Coordination normal.  Skin: Skin is warm and dry. No rash noted. No diaphoresis. No erythema. No pallor.  Psychiatric: Normal mood and affect. Behavior is normal. Judgment and thought content normal.    LABORATORY RESULTS: Labs are reviewed    RADIOLOGY RESULTS: Images and reports reviewed. PET negative for distant disease EUS and PET positive for local nodal disease Colonoscopy report reviewed.   CARDIAC MURMUR, AORTIC Will need cardiac clearance for surgery by Dr. Alanda Amass Reportedly has negative stress test  CHRONIC OBSTRUCTIVE PULMONARY DISEASE, ACUTE EXACERBATION Will need aggressive pulmonary toilet post operatively May need pulmonary consult as inpatient if patient has difficulty post op  Iron deficiency anemia Taking iron supplements Having significant nausea  with them.  Rectal cancer, dx with anemia, 10 cm from anal verge uT3N0 Starting neoadjuvant treatment Monday august 20. Drs. Moody and Odogwu treating up front. Supposed to finish treatment at end of September.   This would put [place] at an OR target at the end of November/early December. Discussed lap LAR with pt and the reasonable likelihood of needing a temporary ileostomy.  Maudry Diego MD Surgical Oncology, General and Endocrine Surgery [facility]      Visit Diagnoses: 1. Rectal cancer, dx with anemia, 10 cm from anal verge uT3N0   2. CARDIAC MURMUR, AORTIC   3. CHRONIC OBSTRUCTIVE PULMONARY DISEASE, ACUTE EXACERBATION   4. Iron deficiency anemia     Care Team: Illene Regulus, MD, MD Dr. Christella Hartigan Dr. Dalene Carrow Dr. Mitzi Hansen Dr. Alanda Amass [facility] cardiology) Dr. Marchelle Gearing

## 2011-05-27 ENCOUNTER — Other Ambulatory Visit: Payer: Self-pay | Admitting: Hematology and Oncology

## 2011-05-27 ENCOUNTER — Encounter (HOSPITAL_BASED_OUTPATIENT_CLINIC_OR_DEPARTMENT_OTHER): Payer: Medicare Other | Admitting: Hematology and Oncology

## 2011-05-27 DIAGNOSIS — C20 Malignant neoplasm of rectum: Secondary | ICD-10-CM

## 2011-05-27 LAB — COMPREHENSIVE METABOLIC PANEL
ALT: 8 U/L (ref 0–35)
AST: 16 U/L (ref 0–37)
Albumin: 3.8 g/dL (ref 3.5–5.2)
Alkaline Phosphatase: 75 U/L (ref 39–117)
BUN: 11 mg/dL (ref 6–23)
CO2: 26 mEq/L (ref 19–32)
Calcium: 8.8 mg/dL (ref 8.4–10.5)
Chloride: 107 mEq/L (ref 96–112)
Creatinine, Ser: 0.84 mg/dL (ref 0.50–1.10)
Glucose, Bld: 92 mg/dL (ref 70–99)
Potassium: 4.1 mEq/L (ref 3.5–5.3)
Sodium: 139 mEq/L (ref 135–145)
Total Bilirubin: 0.7 mg/dL (ref 0.3–1.2)
Total Protein: 6.1 g/dL (ref 6.0–8.3)

## 2011-05-27 LAB — CBC WITH DIFFERENTIAL/PLATELET
BASO%: 0.4 % (ref 0.0–2.0)
Basophils Absolute: 0 10*3/uL (ref 0.0–0.1)
EOS%: 2.8 % (ref 0.0–7.0)
Eosinophils Absolute: 0.2 10*3/uL (ref 0.0–0.5)
HCT: 27.2 % — ABNORMAL LOW (ref 34.8–46.6)
HGB: 8.6 g/dL — ABNORMAL LOW (ref 11.6–15.9)
LYMPH%: 13 % — ABNORMAL LOW (ref 14.0–49.7)
MCH: 24.5 pg — ABNORMAL LOW (ref 25.1–34.0)
MCHC: 31.7 g/dL (ref 31.5–36.0)
MCV: 77.3 fL — ABNORMAL LOW (ref 79.5–101.0)
MONO#: 0.8 10*3/uL (ref 0.1–0.9)
MONO%: 11 % (ref 0.0–14.0)
NEUT#: 5.1 10*3/uL (ref 1.5–6.5)
NEUT%: 72.8 % (ref 38.4–76.8)
Platelets: 230 10*3/uL (ref 145–400)
RBC: 3.52 10*6/uL — ABNORMAL LOW (ref 3.70–5.45)
RDW: 21 % — ABNORMAL HIGH (ref 11.2–14.5)
WBC: 7 10*3/uL (ref 3.9–10.3)
lymph#: 0.9 10*3/uL (ref 0.9–3.3)

## 2011-05-31 ENCOUNTER — Telehealth: Payer: Self-pay | Admitting: Internal Medicine

## 2011-05-31 ENCOUNTER — Ambulatory Visit (HOSPITAL_COMMUNITY): Payer: Self-pay

## 2011-05-31 ENCOUNTER — Encounter (HOSPITAL_COMMUNITY): Payer: Medicare Other

## 2011-05-31 NOTE — Telephone Encounter (Signed)
Spoke to patient who appraised me of rectal cancer dx. Wished her well

## 2011-05-31 NOTE — Progress Notes (Unsigned)
This encounter was created in error - please disregard.

## 2011-06-02 ENCOUNTER — Ambulatory Visit (HOSPITAL_COMMUNITY): Payer: Self-pay

## 2011-06-02 ENCOUNTER — Encounter (HOSPITAL_COMMUNITY): Payer: Medicare Other

## 2011-06-06 ENCOUNTER — Other Ambulatory Visit: Payer: Self-pay | Admitting: Internal Medicine

## 2011-06-07 ENCOUNTER — Ambulatory Visit (HOSPITAL_COMMUNITY): Payer: Self-pay

## 2011-06-07 ENCOUNTER — Encounter (HOSPITAL_COMMUNITY): Payer: Medicare Other

## 2011-06-08 ENCOUNTER — Other Ambulatory Visit: Payer: Self-pay | Admitting: Hematology and Oncology

## 2011-06-08 ENCOUNTER — Encounter (HOSPITAL_BASED_OUTPATIENT_CLINIC_OR_DEPARTMENT_OTHER): Payer: Medicare Other | Admitting: Hematology and Oncology

## 2011-06-08 DIAGNOSIS — C20 Malignant neoplasm of rectum: Secondary | ICD-10-CM

## 2011-06-08 LAB — CBC WITH DIFFERENTIAL/PLATELET
BASO%: 0.3 % (ref 0.0–2.0)
Basophils Absolute: 0 10*3/uL (ref 0.0–0.1)
EOS%: 3.2 % (ref 0.0–7.0)
Eosinophils Absolute: 0.2 10*3/uL (ref 0.0–0.5)
HCT: 29.7 % — ABNORMAL LOW (ref 34.8–46.6)
HGB: 9.4 g/dL — ABNORMAL LOW (ref 11.6–15.9)
LYMPH%: 8.1 % — ABNORMAL LOW (ref 14.0–49.7)
MCH: 26.1 pg (ref 25.1–34.0)
MCHC: 31.8 g/dL (ref 31.5–36.0)
MCV: 82.1 fL (ref 79.5–101.0)
MONO#: 0.5 10*3/uL (ref 0.1–0.9)
MONO%: 9.5 % (ref 0.0–14.0)
NEUT#: 4.5 10*3/uL (ref 1.5–6.5)
NEUT%: 78.9 % — ABNORMAL HIGH (ref 38.4–76.8)
Platelets: 198 10*3/uL (ref 145–400)
RBC: 3.62 10*6/uL — ABNORMAL LOW (ref 3.70–5.45)
RDW: 26.9 % — ABNORMAL HIGH (ref 11.2–14.5)
WBC: 5.7 10*3/uL (ref 3.9–10.3)
lymph#: 0.5 10*3/uL — ABNORMAL LOW (ref 0.9–3.3)

## 2011-06-08 LAB — BASIC METABOLIC PANEL
BUN: 14 mg/dL (ref 6–23)
CO2: 25 mEq/L (ref 19–32)
Calcium: 9.1 mg/dL (ref 8.4–10.5)
Chloride: 105 mEq/L (ref 96–112)
Creatinine, Ser: 0.84 mg/dL (ref 0.50–1.10)
Glucose, Bld: 106 mg/dL — ABNORMAL HIGH (ref 70–99)
Potassium: 4.1 mEq/L (ref 3.5–5.3)
Sodium: 141 mEq/L (ref 135–145)

## 2011-06-09 ENCOUNTER — Encounter (HOSPITAL_COMMUNITY): Payer: Medicare Other

## 2011-06-09 ENCOUNTER — Ambulatory Visit (HOSPITAL_COMMUNITY): Payer: Self-pay

## 2011-06-12 ENCOUNTER — Other Ambulatory Visit: Payer: Self-pay | Admitting: Internal Medicine

## 2011-06-14 ENCOUNTER — Encounter (HOSPITAL_COMMUNITY): Payer: Medicare Other

## 2011-06-14 ENCOUNTER — Ambulatory Visit (HOSPITAL_COMMUNITY): Payer: Self-pay

## 2011-06-16 ENCOUNTER — Ambulatory Visit (HOSPITAL_COMMUNITY): Payer: Self-pay

## 2011-06-16 ENCOUNTER — Encounter (HOSPITAL_COMMUNITY): Payer: Medicare Other

## 2011-06-21 ENCOUNTER — Encounter (HOSPITAL_COMMUNITY): Payer: Medicare Other

## 2011-06-21 ENCOUNTER — Ambulatory Visit (HOSPITAL_COMMUNITY): Payer: Self-pay

## 2011-06-22 ENCOUNTER — Other Ambulatory Visit: Payer: Self-pay | Admitting: Hematology and Oncology

## 2011-06-22 ENCOUNTER — Encounter (HOSPITAL_BASED_OUTPATIENT_CLINIC_OR_DEPARTMENT_OTHER): Payer: Medicare Other | Admitting: Hematology and Oncology

## 2011-06-22 DIAGNOSIS — C20 Malignant neoplasm of rectum: Secondary | ICD-10-CM

## 2011-06-22 DIAGNOSIS — D649 Anemia, unspecified: Secondary | ICD-10-CM

## 2011-06-22 DIAGNOSIS — Z801 Family history of malignant neoplasm of trachea, bronchus and lung: Secondary | ICD-10-CM

## 2011-06-22 LAB — CBC WITH DIFFERENTIAL/PLATELET
BASO%: 0.7 % (ref 0.0–2.0)
Basophils Absolute: 0 10*3/uL (ref 0.0–0.1)
EOS%: 7.1 % — ABNORMAL HIGH (ref 0.0–7.0)
Eosinophils Absolute: 0.4 10*3/uL (ref 0.0–0.5)
HCT: 31.6 % — ABNORMAL LOW (ref 34.8–46.6)
HGB: 10.4 g/dL — ABNORMAL LOW (ref 11.6–15.9)
LYMPH%: 7.3 % — ABNORMAL LOW (ref 14.0–49.7)
MCH: 28.3 pg (ref 25.1–34.0)
MCHC: 32.8 g/dL (ref 31.5–36.0)
MCV: 86.3 fL (ref 79.5–101.0)
MONO#: 0.6 10*3/uL (ref 0.1–0.9)
MONO%: 10.8 % (ref 0.0–14.0)
NEUT#: 4.2 10*3/uL (ref 1.5–6.5)
NEUT%: 74.1 % (ref 38.4–76.8)
Platelets: 194 10*3/uL (ref 145–400)
RBC: 3.66 10*6/uL — ABNORMAL LOW (ref 3.70–5.45)
RDW: 32.2 % — ABNORMAL HIGH (ref 11.2–14.5)
WBC: 5.6 10*3/uL (ref 3.9–10.3)
lymph#: 0.4 10*3/uL — ABNORMAL LOW (ref 0.9–3.3)

## 2011-06-22 LAB — BASIC METABOLIC PANEL
BUN: 13 mg/dL (ref 6–23)
CO2: 28 mEq/L (ref 19–32)
Calcium: 9.1 mg/dL (ref 8.4–10.5)
Chloride: 104 mEq/L (ref 96–112)
Creatinine, Ser: 0.84 mg/dL (ref 0.50–1.10)
Glucose, Bld: 85 mg/dL (ref 70–99)
Potassium: 4.1 mEq/L (ref 3.5–5.3)
Sodium: 141 mEq/L (ref 135–145)

## 2011-06-23 ENCOUNTER — Ambulatory Visit (HOSPITAL_COMMUNITY): Payer: Self-pay

## 2011-06-23 ENCOUNTER — Encounter (HOSPITAL_COMMUNITY): Payer: Medicare Other

## 2011-06-23 ENCOUNTER — Other Ambulatory Visit: Payer: Self-pay | Admitting: Internal Medicine

## 2011-06-27 ENCOUNTER — Telehealth: Payer: Self-pay | Admitting: Internal Medicine

## 2011-06-27 MED ORDER — MONTELUKAST SODIUM 10 MG PO TABS: 10.0000 mg | ORAL_TABLET | Freq: Every day | ORAL | Status: DC

## 2011-06-27 NOTE — Telephone Encounter (Signed)
Spoke with pt. She states that her rx for singulair was denied. I advised I do not see in her records where we denied it, it's still on her med list and she is supposed to take med so I sent refill to her pharm. She states nothing further needed.

## 2011-06-28 ENCOUNTER — Encounter (HOSPITAL_COMMUNITY): Payer: Medicare Other

## 2011-06-28 ENCOUNTER — Ambulatory Visit (HOSPITAL_COMMUNITY): Payer: Self-pay

## 2011-06-30 ENCOUNTER — Ambulatory Visit (HOSPITAL_COMMUNITY): Payer: Self-pay

## 2011-06-30 ENCOUNTER — Encounter (HOSPITAL_COMMUNITY): Payer: Medicare Other

## 2011-07-01 ENCOUNTER — Ambulatory Visit (INDEPENDENT_AMBULATORY_CARE_PROVIDER_SITE_OTHER): Payer: Medicare Other | Admitting: General Surgery

## 2011-07-01 VITALS — BP 130/80 | HR 76 | Temp 98.6°F | Resp 18 | Ht 68.0 in | Wt 229.8 lb

## 2011-07-01 DIAGNOSIS — C20 Malignant neoplasm of rectum: Secondary | ICD-10-CM

## 2011-07-01 NOTE — Assessment & Plan Note (Signed)
Finishing chemoradiation 9/27.  Plan surgery for 6-10 weeks afterward.   Will be around thanksgiving. Again discussed surgical procedure, likelihood of ileostomy, and post op care and risks.   Risk of leak, wound infection, lung or heart problems discussed. Pt will need to be marked for ileostomy. She has appt with Cards.   She has already discussed with Dr. Marchelle Gearing.

## 2011-07-01 NOTE — Progress Notes (Signed)
HISTORY: Pt has been tolerating chemoradiation well.  She has not had any frequency or urgency.  She is not having any pain, either.  Her bowel movements and appetite have been OK.  She is also doing well from cardiac and pulmonary standpoint.  She has an appointment with Dr. Alanda Amass.     EXAM: Head: Normocephalic and atraumatic.  Eyes:  Conjunctivae are normal. Pupils are equal, round, and reactive to light. No scleral icterus.  Resp: No respiratory distress, normal effort. Abd: Soft.  Abdomen is soft, non distended and non tender No masses are palpable.  There is no rebound and no guarding.  Neurological: Alert and oriented to person, place, and time. Coordination normal.  Skin: Skin is warm and dry. No rash noted. No diaphoretic. No erythema. No pallor.  Psychiatric: Normal mood and affect. Normal behavior. Judgment and thought content normal.    ASSESSMENT AND PLAN Rectal cancer, dx with anemia, 10 cm from anal verge uT3N0 Finishing chemoradiation 9/27.  Plan surgery for 6-10 weeks afterward.   Will be around thanksgiving. Again discussed surgical procedure, likelihood of ileostomy, and post op care and risks.   Risk of leak, wound infection, lung or heart problems discussed. Pt will need to be marked for ileostomy. She has appt with Cards.   She has already discussed with Dr. Marchelle Gearing.     Maudry Diego, MD Surgical Oncology, General & Endocrine Surgery [facility]  Illene Regulus, MD, MD Debby Bud Carney Harder*

## 2011-07-01 NOTE — Patient Instructions (Signed)
Cancer of the Colon, Surgical Treatment Based on the location and type of colon cancer that you have, we have determined that surgical removal of this part of your colon will be part of your treatment.  This will treat symptoms of bleeding or blockage that you may be experiencing.  I will do everything that I can to remove your entire tumor safely. To do this, some normal tissue must also be removed to give you the best chance for a cure. The following will help describe what happens when you have this surgery.  TREATMENT Surgery is the most common treatment for colorectal cancer. It is a type of local therapy. It treats the cancer in the colon or rectum and the area close to the tumor by removing the tumor and some of the healthy tissue around it. This tissue includes an apron of tissue with lymph nodes and blood vessels.  We usually use laparoscopy (smaller incisions) to help minimize the size of the larger incision on your abdomen.  However, even if we use laparoscopy primarily, we still need to make an incision large enough to bring the two ends of the colon out to reconnect them.  For tumors that are very large or very adherent to the adjacent structures, we may have to make a larger incision to do your operation safely. The surgeon checks the rest of the abdomen, the intestine and the liver to see if the cancer has spread.  There may be microscopic disease present that we are unable to detect.  We may do a liver ultrasound during surgery as part of this evaluation.    When a section of the colon or rectum is removed, we can usually reconnect the healthy parts. However, sometimes reconnection is not possible. In this case, we will make a new path for your bowel movements to leave the body. This is an opening (a stoma) in the wall of the abdomen. The upper end of the intestine is then connected to the stoma. The other end is closed. The operation to create the stoma is called a colostomy. A flat bag fits  over the stoma to collect waste, and a special adhesive holds it in place.  Most patients are able to do their normal activities with a colostomy, including travelling, exercising, and swimming.    Some colostomies are temporary. The colostomy is needed only until the colon or rectum heals from surgery. Some patients will need to get additional treatment such as chemotherapy before the ostomy can be taken down.  After this occurs, we can reconnect the parts of the intestine and closes the stoma.  Some  patients need a permanent colostomy, and some patients are too frail to undergo another surgery to take the ostomy down.    The part of colon that we plan to remove in your case is the sigmoid colon and top of rectum.  Whatever the operative findings, I will discuss the case with your family after we are done in the operating room.  I will talk to you the next day when you are more awake.  I will see you in the hospital every weekday that I am not out of town.  My partners help see patients on the weekends and if I am out of town.    IF YOU ARE TAKING ASPIRIN, PLAVIX, COUMADIN, OR OTHER BLOOD THINNERS, LET us KNOW IMMEDIATELY SO WE CAN CONTACT YOUR PRESCRIBING HEALTH CARE PROVIDER TO HOLD THE MEDICATION FOR 5-7 DAYS BEFORE SURGERY  WHAT HAPPENS AFTER SURGERY: After surgery, you will go first to the recovery room, then to your room.   You will have many tubes and lines in place which is standard for this operation.  You will have several IVs, including possibly a central line in your chest or neck.  YOU WILL NOT BE ABLE TO EAT FOR SEVERAL DAYS AFTER SURGERY.  You will have a catheter in your bladder for around 1-3 days.  On your abdomen, you will have and possibly a pain pump with numbing medicine.  You will have compression stockings on your legs to decrease the risk of blood clots.    We will address your pain in several ways.  We will use an IV pain pump called a "PCA," or Patient Controlled  Analgesia.  This allows you to press a button and immediately receive a dose of pain medication without waiting for a nurse.  We also use IV Tylenol and sometimes IV Toradol which is similar to ibuprofen.  You may also have a pump with numbing medicine delivered directly to your incision.  I use doses and medications that work for the majority of people, but you may need an adjustment to the dose or type of medicine if your pain is not adequately controlled.  Your throat may be sore, in which case you may need a throat spray or lozenges.  Some patients become disoriented with the pain medication and may need adjustments due to that.    We will ask you to get out of bed the day after surgery in order to maximize your chances of not having complications.  Your risk of pneumonia and blood clots is lower with walking and sitting in the chair.  We will also ask you to perform breathing exercises.  We will also ask to you walk in your room and in the halls for the above reasons, but also in order for you to keep up your strength.    EATING: We will usually start you on clear liquids in around 1-2 days if your bowel function seems to be returning.  We advance your diet slowly to make sure you are tolerating each step.  All patients do not have a normal appetite when they go home.  Many patients also find that their taste buds do not seem the same right after surgery, and this can continue into the time of possible post operative chemotherapy and radiation.  We may need to use different doses or type of medicine than you use at home to control high blood pressure, diabetes, or other medical problems.   SLEEPING: We do not give sleeping medications in the hospital routinely.  Many patients have difficulty sleeping due to the unfamiliar environment or the medical care that occurs at night.  Sleeping medications can interfere with the pain medications and cause significant mental status changes that are dangerous.     OTHER TREATMENT? I will present your case when the pathology is available at our GI Multidisciplinary conference which occurs every 1-2 weeks.  Medical and Radiation Oncologists are present, as well as the pathologist and radiologist in addition to myself.  If you have a larger tumor or if you have positive lymph nodes, we may have the oncologist stop by to see you while you are in the hospital.  Most firm post op treatment plans occur, however, once you are an outpatient because we will need to see how you are recovering from surgery.  GOING HOME! Usually you are  able to go home in 4-8 days, depending on whether or not complications happen and what is going on with your overall health status.   If you have more health problems or if you have limited help at home, the therapists and nurses may recommend a temporary rehab or nursing facility to help you get back on your feet before you go home.  These decisions would be made while you are in the hospital with the assistance of a social worker or case manager.  You cannot do any heavy lifting for 6-8 weeks due to the risk of hernia.    Please bring all insurance/disability forms to our office for the staff to fill out   POSSIBLE COMPLICATIONS This is a major operation and includes complications listed below: Bleeding Infection and possible wound complications such as hernia Damage to adjacent structures Leak of surgical connections, which can lead to other surgeries and possibly an ostomy. (5-10%) Possible need for other procedures, such as abscess drains in radiology  Possible prolonged hospital stay Possible diarrhea from removal of part of the colon. Possible constipation from narcotics.    Prolonged fatigue/weakness/appetite MOST PATIENTS' ENERGY LEVEL IS NOT BACK TO NORMAL FOR AT LEAST 2-4 MONTHS.  OLDER PATIENTS MAY FEEL WEAK FOR LONGER PERIODS OF TIME.   Difficulty with eating or post operative nausea  Possible early recurrence of  cancer Possible complications of your medical problems such as heart disease or arrhythmias. Death (less than 1%)  All possible complications are not listed, just the most common.    FURTHER INFORMATION? Please ask questions if you find something that we did not discuss in the office and would like more information.  If you would like another appointment if you have many questions or if your family members would like to come as well, please contact the office.    FOLLOW-UP CARE  Follow-up care after treatment for colorectal cancer is important. Even when the cancer seems to have been completely removed or destroyed, the disease sometimes returns. Undetected cancer cells may still remain somewhere in the body after treatment. We check your recovery and for recurrence of the cancer. Recurrence means that the cancer comes back.  Checkups help make sure that changes in health are found. Checkups may include:  A physical exam (possibly including a digital rectal exam). This means your caregiver checks you to see if there are any abnormal changes they can see or feel.   Lab tests (CEA test) may be done. The "fecal occult blood test" checks for blood in the stool. The CEA (carcinoembryonic antigen) is a blood test that looks for a marker of colon cancer in the blood.   A colonoscopy is a test where your caregiver examines your colon with a flexible instrument like a thin telescope which looks at the inside of the large bowel. All patients with colon cancer are recommended to get a colonoscopy 1 year after the surgery  Other specialized x-rays, CT scans, or other tests may be performed.    Between scheduled visits you should contact your caregivers as soon as any health problems appear.

## 2011-07-04 LAB — CBC
HCT: 42.9
Hemoglobin: 14.6
MCHC: 34
MCV: 90.9
Platelets: 271
RBC: 4.71
RDW: 14
WBC: 15.5 — ABNORMAL HIGH

## 2011-07-04 LAB — DIFFERENTIAL
Basophils Absolute: 0
Basophils Relative: 0
Eosinophils Absolute: 0.1
Eosinophils Relative: 1
Lymphocytes Relative: 5 — ABNORMAL LOW
Lymphs Abs: 0.7
Monocytes Absolute: 1.3 — ABNORMAL HIGH
Monocytes Relative: 8
Neutro Abs: 13.5 — ABNORMAL HIGH
Neutrophils Relative %: 87 — ABNORMAL HIGH

## 2011-07-04 LAB — COMPREHENSIVE METABOLIC PANEL
ALT: 22
AST: 19
Albumin: 2.9 — ABNORMAL LOW
Alkaline Phosphatase: 95
BUN: 15
CO2: 25
Calcium: 9.1
Chloride: 102
Creatinine, Ser: 0.88
GFR calc Af Amer: >60
GFR calc non Af Amer: >60
Glucose, Bld: 113 — ABNORMAL HIGH
Potassium: 4
Sodium: 137
Total Bilirubin: 1.9 — ABNORMAL HIGH
Total Protein: 7.1

## 2011-07-05 ENCOUNTER — Encounter (HOSPITAL_COMMUNITY): Payer: Medicare Other

## 2011-07-05 ENCOUNTER — Ambulatory Visit (HOSPITAL_COMMUNITY): Payer: Self-pay

## 2011-07-05 LAB — BASIC METABOLIC PANEL
BUN: 29 — ABNORMAL HIGH
CO2: 28
Calcium: 9.6
Chloride: 100
Creatinine, Ser: 1.02
GFR calc Af Amer: >60
GFR calc non Af Amer: 55 — ABNORMAL LOW
Glucose, Bld: 205 — ABNORMAL HIGH
Potassium: 4
Sodium: 138

## 2011-07-05 LAB — COMPREHENSIVE METABOLIC PANEL
ALT: 17
AST: 22
Albumin: 3.2 — ABNORMAL LOW
Alkaline Phosphatase: 69
BUN: 17
CO2: 31
Calcium: 9.2
Chloride: 99
Creatinine, Ser: 0.86
GFR calc Af Amer: >60
GFR calc non Af Amer: >60
Glucose, Bld: 81
Potassium: 3.9
Sodium: 139
Total Bilirubin: 1.2
Total Protein: 6.6

## 2011-07-05 LAB — INFLUENZA A+B VIRUS AG-DIRECT(RAPID)
Inflenza A Ag: NEGATIVE
Influenza B Ag: NEGATIVE

## 2011-07-05 LAB — DIFFERENTIAL
Basophils Absolute: 0
Basophils Relative: 0
Eosinophils Absolute: 0
Eosinophils Relative: 0
Lymphocytes Relative: 4 — ABNORMAL LOW
Lymphs Abs: 0.7
Monocytes Absolute: 0.6
Monocytes Relative: 4
Neutro Abs: 15.2 — ABNORMAL HIGH
Neutrophils Relative %: 92 — ABNORMAL HIGH

## 2011-07-05 LAB — CULTURE, BLOOD (ROUTINE X 2)
Culture: NO GROWTH
Culture: NO GROWTH

## 2011-07-05 LAB — BLOOD GAS, ARTERIAL
Acid-Base Excess: 1.2
Bicarbonate: 24.5 — ABNORMAL HIGH
Drawn by: 145321
FIO2: 0.21
O2 Saturation: 90.3
Patient temperature: 98.6
TCO2: 21.4
pCO2 arterial: 36.2
pH, Arterial: 7.444 — ABNORMAL HIGH
pO2, Arterial: 57.5 — ABNORMAL LOW

## 2011-07-05 LAB — LEGIONELLA ANTIGEN, URINE: Legionella Antigen, Urine: NEGATIVE

## 2011-07-05 LAB — CBC
HCT: 39.1
HCT: 42.1
Hemoglobin: 13.5
Hemoglobin: 14.7
MCHC: 34.4
MCHC: 34.9
MCV: 92.7
MCV: 93.3
Platelets: 211
Platelets: 238
RBC: 4.19
RBC: 4.54
RDW: 14.8
RDW: 15.2
WBC: 16.6 — ABNORMAL HIGH
WBC: 25.2 — ABNORMAL HIGH

## 2011-07-05 LAB — B-NATRIURETIC PEPTIDE (CONVERTED LAB): Pro B Natriuretic peptide (BNP): 64.5

## 2011-07-05 LAB — D-DIMER, QUANTITATIVE (NOT AT ARMC): D-Dimer, Quant: 0.28

## 2011-07-05 LAB — SEDIMENTATION RATE: Sed Rate: 28 — ABNORMAL HIGH

## 2011-07-05 LAB — STREP PNEUMONIAE URINARY ANTIGEN: Strep Pneumo Urinary Antigen: NEGATIVE

## 2011-07-07 ENCOUNTER — Other Ambulatory Visit: Payer: Self-pay | Admitting: Hematology and Oncology

## 2011-07-07 ENCOUNTER — Encounter (HOSPITAL_COMMUNITY): Payer: Medicare Other

## 2011-07-07 ENCOUNTER — Encounter (HOSPITAL_BASED_OUTPATIENT_CLINIC_OR_DEPARTMENT_OTHER): Payer: Medicare Other | Admitting: Hematology and Oncology

## 2011-07-07 ENCOUNTER — Ambulatory Visit (HOSPITAL_COMMUNITY): Payer: Self-pay

## 2011-07-07 ENCOUNTER — Telehealth (INDEPENDENT_AMBULATORY_CARE_PROVIDER_SITE_OTHER): Payer: Self-pay

## 2011-07-07 DIAGNOSIS — D649 Anemia, unspecified: Secondary | ICD-10-CM

## 2011-07-07 DIAGNOSIS — C20 Malignant neoplasm of rectum: Secondary | ICD-10-CM

## 2011-07-07 DIAGNOSIS — Z801 Family history of malignant neoplasm of trachea, bronchus and lung: Secondary | ICD-10-CM

## 2011-07-07 DIAGNOSIS — Z23 Encounter for immunization: Secondary | ICD-10-CM

## 2011-07-07 LAB — CBC WITH DIFFERENTIAL/PLATELET
BASO%: 0.4 % (ref 0.0–2.0)
Basophils Absolute: 0 10*3/uL (ref 0.0–0.1)
EOS%: 10.8 % — ABNORMAL HIGH (ref 0.0–7.0)
Eosinophils Absolute: 0.5 10*3/uL (ref 0.0–0.5)
HCT: 33.9 % — ABNORMAL LOW (ref 34.8–46.6)
HGB: 11.4 g/dL — ABNORMAL LOW (ref 11.6–15.9)
LYMPH%: 5.6 % — ABNORMAL LOW (ref 14.0–49.7)
MCH: 30.4 pg (ref 25.1–34.0)
MCHC: 33.7 g/dL (ref 31.5–36.0)
MCV: 90.1 fL (ref 79.5–101.0)
MONO#: 0.5 10*3/uL (ref 0.1–0.9)
MONO%: 10.4 % (ref 0.0–14.0)
NEUT#: 3.5 10*3/uL (ref 1.5–6.5)
NEUT%: 72.8 % (ref 38.4–76.8)
Platelets: 162 10*3/uL (ref 145–400)
RBC: 3.76 10*6/uL (ref 3.70–5.45)
RDW: 32.8 % — ABNORMAL HIGH (ref 11.2–14.5)
WBC: 4.8 10*3/uL (ref 3.9–10.3)
lymph#: 0.3 10*3/uL — ABNORMAL LOW (ref 0.9–3.3)

## 2011-07-07 LAB — BASIC METABOLIC PANEL
BUN: 11 mg/dL (ref 6–23)
CO2: 27 mEq/L (ref 19–32)
Calcium: 9.8 mg/dL (ref 8.4–10.5)
Chloride: 103 mEq/L (ref 96–112)
Creatinine, Ser: 0.87 mg/dL (ref 0.50–1.10)
Glucose, Bld: 109 mg/dL — ABNORMAL HIGH (ref 70–99)
Potassium: 4 mEq/L (ref 3.5–5.3)
Sodium: 141 mEq/L (ref 135–145)

## 2011-07-07 NOTE — Telephone Encounter (Signed)
Called pt to follow up on her ostomy care.  I left a message for Page Spiro, RN., to call the pt after 2 pm today.

## 2011-07-12 ENCOUNTER — Encounter (HOSPITAL_COMMUNITY): Payer: Medicare Other

## 2011-07-12 ENCOUNTER — Ambulatory Visit (HOSPITAL_COMMUNITY): Payer: Self-pay

## 2011-07-14 ENCOUNTER — Encounter (HOSPITAL_COMMUNITY): Payer: Medicare Other

## 2011-07-14 ENCOUNTER — Ambulatory Visit (HOSPITAL_COMMUNITY): Payer: Self-pay

## 2011-07-19 ENCOUNTER — Ambulatory Visit (HOSPITAL_COMMUNITY): Payer: Self-pay

## 2011-07-19 ENCOUNTER — Encounter (HOSPITAL_COMMUNITY): Payer: Medicare Other

## 2011-07-21 ENCOUNTER — Ambulatory Visit (HOSPITAL_COMMUNITY): Payer: Self-pay

## 2011-07-21 ENCOUNTER — Encounter (HOSPITAL_COMMUNITY): Payer: Medicare Other

## 2011-07-26 ENCOUNTER — Encounter (HOSPITAL_COMMUNITY): Payer: Medicare Other

## 2011-07-26 ENCOUNTER — Ambulatory Visit (HOSPITAL_COMMUNITY): Payer: Self-pay

## 2011-07-28 ENCOUNTER — Ambulatory Visit (HOSPITAL_COMMUNITY): Payer: Self-pay

## 2011-07-28 ENCOUNTER — Encounter (HOSPITAL_COMMUNITY): Payer: Medicare Other

## 2011-08-02 ENCOUNTER — Ambulatory Visit (HOSPITAL_COMMUNITY): Payer: Self-pay

## 2011-08-02 ENCOUNTER — Encounter (HOSPITAL_COMMUNITY): Payer: Medicare Other

## 2011-08-04 ENCOUNTER — Ambulatory Visit (HOSPITAL_COMMUNITY): Payer: Self-pay

## 2011-08-04 ENCOUNTER — Encounter (HOSPITAL_COMMUNITY): Payer: Medicare Other

## 2011-08-09 ENCOUNTER — Ambulatory Visit (HOSPITAL_COMMUNITY): Payer: Self-pay

## 2011-08-09 ENCOUNTER — Encounter (HOSPITAL_COMMUNITY): Payer: Medicare Other

## 2011-08-10 ENCOUNTER — Encounter (HOSPITAL_BASED_OUTPATIENT_CLINIC_OR_DEPARTMENT_OTHER): Payer: Medicare Other | Admitting: Hematology and Oncology

## 2011-08-10 ENCOUNTER — Other Ambulatory Visit: Payer: Self-pay | Admitting: Hematology and Oncology

## 2011-08-10 ENCOUNTER — Ambulatory Visit (HOSPITAL_COMMUNITY)
Admission: RE | Admit: 2011-08-10 | Discharge: 2011-08-10 | Disposition: A | Discharge status: Home / Self Care | Payer: Medicare Other | Source: Ambulatory Visit | Attending: Hematology and Oncology | Admitting: Hematology and Oncology

## 2011-08-10 DIAGNOSIS — C20 Malignant neoplasm of rectum: Secondary | ICD-10-CM

## 2011-08-10 DIAGNOSIS — R918 Other nonspecific abnormal finding of lung field: Secondary | ICD-10-CM | POA: Insufficient documentation

## 2011-08-10 DIAGNOSIS — J479 Bronchiectasis, uncomplicated: Secondary | ICD-10-CM | POA: Insufficient documentation

## 2011-08-10 DIAGNOSIS — Z79899 Other long term (current) drug therapy: Secondary | ICD-10-CM | POA: Insufficient documentation

## 2011-08-10 LAB — CMP (CANCER CENTER ONLY)
ALT(SGPT): 16 U/L (ref 10–47)
AST: 18 U/L (ref 11–38)
Albumin: 3.2 g/dL — ABNORMAL LOW (ref 3.3–5.5)
Alkaline Phosphatase: 75 U/L (ref 26–84)
BUN, Bld: 18 mg/dL (ref 7–22)
CO2: 32 mEq/L (ref 18–33)
Calcium: 9 mg/dL (ref 8.0–10.3)
Chloride: 98 mEq/L (ref 98–108)
Creat: 0.7 mg/dl (ref 0.6–1.2)
Glucose, Bld: 92 mg/dL (ref 73–118)
Potassium: 4.2 mEq/L (ref 3.3–4.7)
Sodium: 141 mEq/L (ref 128–145)
Total Bilirubin: 0.6 mg/dl (ref 0.20–1.60)
Total Protein: 7.1 g/dL (ref 6.4–8.1)

## 2011-08-10 LAB — CBC WITH DIFFERENTIAL/PLATELET
BASO%: 0.6 % (ref 0.0–2.0)
Basophils Absolute: 0 10*3/uL (ref 0.0–0.1)
EOS%: 3.5 % (ref 0.0–7.0)
Eosinophils Absolute: 0.2 10*3/uL (ref 0.0–0.5)
HCT: 36.8 % (ref 34.8–46.6)
HGB: 12.2 g/dL (ref 11.6–15.9)
LYMPH%: 8.5 % — ABNORMAL LOW (ref 14.0–49.7)
MCH: 31.2 pg (ref 25.1–34.0)
MCHC: 33.2 g/dL (ref 31.5–36.0)
MCV: 93.9 fL (ref 79.5–101.0)
MONO#: 0.7 10*3/uL (ref 0.1–0.9)
MONO%: 11.1 % (ref 0.0–14.0)
NEUT#: 4.6 10*3/uL (ref 1.5–6.5)
NEUT%: 76.3 % (ref 38.4–76.8)
Platelets: 215 10*3/uL (ref 145–400)
RBC: 3.92 10*6/uL (ref 3.70–5.45)
RDW: 22 % — ABNORMAL HIGH (ref 11.2–14.5)
WBC: 6.1 10*3/uL (ref 3.9–10.3)
lymph#: 0.5 10*3/uL — ABNORMAL LOW (ref 0.9–3.3)

## 2011-08-10 LAB — CEA: CEA: <0.5 ng/mL (ref 0.0–5.0)

## 2011-08-10 MED ORDER — IOHEXOL 300 MG/ML  SOLN
100.0000 mL | Freq: Once | INTRAMUSCULAR | Status: AC | PRN
  Administered 2011-08-10: 100 mL via INTRAVENOUS

## 2011-08-11 ENCOUNTER — Encounter (HOSPITAL_COMMUNITY): Payer: Medicare Other

## 2011-08-11 ENCOUNTER — Ambulatory Visit (HOSPITAL_COMMUNITY): Payer: Self-pay

## 2011-08-12 ENCOUNTER — Encounter (HOSPITAL_BASED_OUTPATIENT_CLINIC_OR_DEPARTMENT_OTHER): Payer: Medicare Other | Admitting: Hematology and Oncology

## 2011-08-12 ENCOUNTER — Ambulatory Visit
Admission: RE | Admit: 2011-08-12 | Discharge: 2011-08-12 | Disposition: A | Discharge status: Home / Self Care | Payer: Medicare Other | Source: Ambulatory Visit | Attending: Radiation Oncology | Admitting: Radiation Oncology

## 2011-08-12 ENCOUNTER — Other Ambulatory Visit: Payer: Self-pay | Admitting: Internal Medicine

## 2011-08-12 DIAGNOSIS — Z801 Family history of malignant neoplasm of trachea, bronchus and lung: Secondary | ICD-10-CM

## 2011-08-12 DIAGNOSIS — D649 Anemia, unspecified: Secondary | ICD-10-CM

## 2011-08-12 DIAGNOSIS — C20 Malignant neoplasm of rectum: Secondary | ICD-10-CM

## 2011-08-15 NOTE — Progress Notes (Deleted)
CC:   [name], MD [name], MD Rosalyn Gess. Norins, MD [name], MD Richard A. Alanda Amass, [name] [name], [name], [name]  IDENTIFYING STATEMENT:  The patient is a 65 year old woman with rectal cancer who presents for followup.  INTERVAL HISTORY:  [name] has had a good couple of weeks.  She feels well.  She has had no loss in weight.  She denies rectal discomfort.  She is enjoying good energy levels.  Reviewed recent CT scans.  The CT of the chest, abdomen, and pelvis on 08/10/2011 noted no evidence of thoracic metastases.  A stable right area of bronchiectasis is noted.  There is a stable small left lower lobe pulmonary nodule.  The abdomen and pelvis that showed decreased rectal thickening when compared to prior CT scan.  There is also decrease in size and density of small perirectal lymph nodes.  There is no evidence of metastatic disease outside the pelvis.  MEDICATIONS:  Documented and recorded in nursing intake sheet.  ALLERGIES:  Sulfa, codeine, prednisone (patient can take Solu-Medrol).  FAMILY HISTORY:  Unchanged.  PAST MEDICAL HISTORY:  Unchanged.  SOCIAL HISTORY:  Unchanged.  REVIEW OF SYSTEMS:  A 10-point review of systems is negative.  PHYSICAL EXAMINATION:  General Appearance:  The patient is a well- appearing, well-nourished woman in no distress.  Vital Signs:  Pulse 69. Blood pressure 115/58.  Temperature 97.4.  Respirations 20.  Weight 233.2 pounds.  HEENT:  Head is atraumatic, normocephalic.  Sclerae are anicteric.  Mouth is moist.  Neck:  Supple.  Chest:  Clear.  CVS: Unremarkable.  Abdomen:  Soft, nontender.  Bowel sounds present. Extremities:  No calf tenderness.  Pulses are present and symmetrical. Lymph Nodes:  Nonpalpable.  CNS:  Nonfocal.  LABORATORY DATA:  On 08/10/2011, white cell count 6.1, hemoglobin 12.2, hematocrit 36.8, platelets 215.  Sodium 141, potassium 4.2, chloride 98, CO2 32, BUN 18, creatinine 0.7, glucose  92, t. bili 0.6, alkaline phosphatase 75, AST 18, ALT 16, calcium 9.  CEA less than 0.5.  IMPRESSION AND PLAN:  Mrs. Merk is a pleasant 65 year old woman with uT3 N1 low-lying invasive rectal adenocarcinoma.  She is status post concurrent Xeloda with radiation therapy from 05/30/2011 through 07/07/2011. Followup staging CTs and lab work are unremarkable, specifically noting decrease rectal thickening and perirectal lymph nodes.  She is well and she will followup with Dr. Donell Beers for surgery.  She follows up with me thereafter.    ______________________________ Laurice Record, [name] LIO/MEDQ  D:  08/12/2011  T:  08/15/2011  Job:  [id number]

## 2011-08-15 NOTE — Progress Notes (Signed)
CC:   Laurice Record, [name] [name], MD [name], MD Richard A. Alanda Amass, [name]  DIAGNOSIS:  Rectal cancer.  INTERVAL SINCE RADIATION TREATMENT:  Approximately 5 weeks.  INTERVAL HISTORY:  Ms. Tomassetti returns to clinic today for followup. She states that she has done quite well since she finished treatment. She does note some occasional nausea, but she states that this typically passes quickly, and she has not had a need to take anything for this. She does have some occasional constipation and takes MiraLAX for this. She did have a repeat CT scan of the chest, abdomen and pelvis completed on 08/10/2011.  There was no evidence for thoracic metastasis and no evidence of metastasis outside of the pelvis.  There was decreased rectal thickening compared to prior imaging and also a decrease in the size and density of small perirectal lymph nodes.  This, therefore, was a good report with no evidence of progression or new disease.  PHYSICAL EXAMINATION:  Weight:  233.2 pounds.  Vital Signs:  Temperature 96.8, pulse 75, respiratory rate 18, blood pressure 131/61.  IMPRESSION AND PLAN:  Ms. Castner returns today for clinic and is doing well.  She does see Dr. Dalene Carrow later this morning, and she is scheduled for surgery with Dr. Donell Beers in a couple of weeks.  I am going to have her return to our clinic in 6 months for continued followup.    ______________________________ [name], [name], [name] JSM/MEDQ  D:  08/12/2011  T:  08/15/2011  Job:  [id number]

## 2011-08-16 ENCOUNTER — Ambulatory Visit (HOSPITAL_COMMUNITY): Payer: Self-pay

## 2011-08-16 ENCOUNTER — Encounter (HOSPITAL_COMMUNITY): Payer: Medicare Other

## 2011-08-18 ENCOUNTER — Encounter (HOSPITAL_COMMUNITY): Payer: Medicare Other

## 2011-08-18 ENCOUNTER — Ambulatory Visit (HOSPITAL_COMMUNITY): Payer: Self-pay

## 2011-08-18 ENCOUNTER — Encounter (HOSPITAL_COMMUNITY): Payer: Self-pay

## 2011-08-18 LAB — URINE MICROSCOPIC-ADD ON

## 2011-08-18 LAB — CBC
HCT: 34.4 % — ABNORMAL LOW (ref 36.0–46.0)
Hemoglobin: 11.2 g/dL — ABNORMAL LOW (ref 12.0–15.0)
MCH: 30.7 pg (ref 26.0–34.0)
MCHC: 32.6 g/dL (ref 30.0–36.0)
MCV: 94.2 fL (ref 78.0–100.0)
Platelets: 187 10*3/uL (ref 150–400)
RBC: 3.65 MIL/uL — ABNORMAL LOW (ref 3.87–5.11)
RDW: 17.1 % — ABNORMAL HIGH (ref 11.5–15.5)
WBC: 7.5 10*3/uL (ref 4.0–10.5)

## 2011-08-18 LAB — URINALYSIS, ROUTINE W REFLEX MICROSCOPIC
Bilirubin Urine: NEGATIVE
Glucose, UA: NEGATIVE mg/dL
Hgb urine dipstick: NEGATIVE
Ketones, ur: NEGATIVE mg/dL
Nitrite: NEGATIVE
Protein, ur: NEGATIVE mg/dL
Specific Gravity, Urine: 1.023 (ref 1.005–1.030)
Urobilinogen, UA: 1 mg/dL (ref 0.0–1.0)
pH: 5.5 (ref 5.0–8.0)

## 2011-08-18 LAB — DIFFERENTIAL
Basophils Absolute: 0 10*3/uL (ref 0.0–0.1)
Basophils Relative: 0 % (ref 0–1)
Eosinophils Absolute: 0.2 10*3/uL (ref 0.0–0.7)
Eosinophils Relative: 3 % (ref 0–5)
Lymphocytes Relative: 8 % — ABNORMAL LOW (ref 12–46)
Lymphs Abs: 0.6 10*3/uL — ABNORMAL LOW (ref 0.7–4.0)
Monocytes Absolute: 0.7 10*3/uL (ref 0.1–1.0)
Monocytes Relative: 10 % (ref 3–12)
Neutro Abs: 5.9 10*3/uL (ref 1.7–7.7)
Neutrophils Relative %: 79 % — ABNORMAL HIGH (ref 43–77)

## 2011-08-18 LAB — COMPREHENSIVE METABOLIC PANEL
ALT: 10 U/L (ref 0–35)
AST: 18 U/L (ref 0–37)
Albumin: 3.2 g/dL — ABNORMAL LOW (ref 3.5–5.2)
Alkaline Phosphatase: 86 U/L (ref 39–117)
BUN: 15 mg/dL (ref 6–23)
CO2: 28 mEq/L (ref 19–32)
Calcium: 9.9 mg/dL (ref 8.4–10.5)
Chloride: 104 mEq/L (ref 96–112)
Creatinine, Ser: 0.82 mg/dL (ref 0.50–1.10)
GFR calc Af Amer: 85 mL/min — ABNORMAL LOW (ref >90)
GFR calc non Af Amer: 73 mL/min — ABNORMAL LOW (ref >90)
Glucose, Bld: 95 mg/dL (ref 70–99)
Potassium: 3.7 mEq/L (ref 3.5–5.1)
Sodium: 140 mEq/L (ref 135–145)
Total Bilirubin: 0.5 mg/dL (ref 0.3–1.2)
Total Protein: 6.5 g/dL (ref 6.0–8.3)

## 2011-08-18 LAB — BLOOD GAS, ARTERIAL
Acid-Base Excess: 2.3 mmol/L — ABNORMAL HIGH (ref 0.0–2.0)
Bicarbonate: 26 mEq/L — ABNORMAL HIGH (ref 20.0–24.0)
Drawn by: 30996
FIO2: 0.21 %
O2 Saturation: 95.1 %
Patient temperature: 98.6
TCO2: 23.6 mmol/L (ref 0–100)
pCO2 arterial: 39 mmHg (ref 35.0–45.0)
pH, Arterial: 7.439 — ABNORMAL HIGH (ref 7.350–7.400)
pO2, Arterial: 73.7 mmHg — ABNORMAL LOW (ref 80.0–100.0)

## 2011-08-18 LAB — SURGICAL PCR SCREEN
MRSA, PCR: NEGATIVE
Staphylococcus aureus: NEGATIVE

## 2011-08-18 LAB — CEA: CEA: 0.6 ng/mL (ref 0.0–5.0)

## 2011-08-18 LAB — PROTIME-INR
INR: 1.17 (ref 0.00–1.49)
Prothrombin Time: 15.1 seconds (ref 11.6–15.2)

## 2011-08-18 LAB — ABO/RH: ABO/RH(D): A NEG

## 2011-08-18 LAB — APTT: aPTT: 33 seconds (ref 24–37)

## 2011-08-18 NOTE — Pre-Procedure Instructions (Signed)
CARDIOLOGY OFFICE NOTE WITH CLEARANCE FOR SURGERY ( LOW ANTERIOR COLON RESECTION / DR. BYERLY ) AND EKG REPORT ON CHART DATED 07/18/11 FROM DR. Alanda Amass. ECHOCARDIOGRAM REPORT 07/28/11 AND STRESS TEST REPORT 04/06/11 - ALSO ON CHART FROM DR. Alanda Amass. CHEST CT REPORT 08/10/11 FROM [facility] ON CHART

## 2011-08-21 ENCOUNTER — Telehealth: Payer: Self-pay | Admitting: Internal Medicine

## 2011-08-21 NOTE — Telephone Encounter (Signed)
On call 08/21/11- sore throat and a little wheeze today, scheduled surgery in 4 days.  Plan - fluids, supportive care. Call surgeon Monday and discuss. Called Rx Z pak to [place] [phone].

## 2011-08-23 ENCOUNTER — Telehealth (INDEPENDENT_AMBULATORY_CARE_PROVIDER_SITE_OTHER): Payer: Self-pay

## 2011-08-23 ENCOUNTER — Ambulatory Visit (HOSPITAL_COMMUNITY): Payer: Self-pay

## 2011-08-23 ENCOUNTER — Encounter (HOSPITAL_COMMUNITY): Payer: Medicare Other

## 2011-08-23 NOTE — Telephone Encounter (Signed)
Olegario Messier states she did preop ostomy teaching for the pt.  She also came in today and asked to be marked for her ostomy.  Olegario Messier did not know the hospital protocol so she just marked with a red marker where the ileostomy would be and put a tegaderm over it.

## 2011-08-25 ENCOUNTER — Other Ambulatory Visit (INDEPENDENT_AMBULATORY_CARE_PROVIDER_SITE_OTHER): Payer: Self-pay | Admitting: General Surgery

## 2011-08-25 ENCOUNTER — Ambulatory Visit (HOSPITAL_COMMUNITY): Payer: Self-pay

## 2011-08-25 ENCOUNTER — Encounter (HOSPITAL_COMMUNITY): Payer: Self-pay | Admitting: *Deleted

## 2011-08-25 ENCOUNTER — Encounter (HOSPITAL_COMMUNITY): Payer: Medicare Other

## 2011-08-25 ENCOUNTER — Encounter (HOSPITAL_COMMUNITY): Payer: Self-pay | Admitting: Anesthesiology

## 2011-08-25 ENCOUNTER — Inpatient Hospital Stay (HOSPITAL_COMMUNITY): Payer: Medicare Other | Admitting: Anesthesiology

## 2011-08-25 ENCOUNTER — Encounter (HOSPITAL_COMMUNITY)
Admission: RE | Disposition: A | Discharge status: Home Health | Payer: Self-pay | Source: Ambulatory Visit | Attending: General Surgery

## 2011-08-25 ENCOUNTER — Inpatient Hospital Stay (HOSPITAL_COMMUNITY)
Admission: RE | Admit: 2011-08-25 | Discharge: 2011-09-03 | DRG: 330 | Disposition: A | Discharge status: Home Health | Payer: Medicare Other | Source: Ambulatory Visit | Attending: General Surgery | Admitting: General Surgery

## 2011-08-25 DIAGNOSIS — J441 Chronic obstructive pulmonary disease with (acute) exacerbation: Secondary | ICD-10-CM | POA: Diagnosis present

## 2011-08-25 DIAGNOSIS — Z6824 Body mass index (BMI) 24.0-24.9, adult: Secondary | ICD-10-CM | POA: Insufficient documentation

## 2011-08-25 DIAGNOSIS — C189 Malignant neoplasm of colon, unspecified: Secondary | ICD-10-CM

## 2011-08-25 DIAGNOSIS — C20 Malignant neoplasm of rectum: Principal | ICD-10-CM | POA: Diagnosis present

## 2011-08-25 DIAGNOSIS — Z01812 Encounter for preprocedural laboratory examination: Secondary | ICD-10-CM

## 2011-08-25 DIAGNOSIS — Z85048 Personal history of other malignant neoplasm of rectum, rectosigmoid junction, and anus: Secondary | ICD-10-CM | POA: Insufficient documentation

## 2011-08-25 DIAGNOSIS — Z432 Encounter for attention to ileostomy: Secondary | ICD-10-CM

## 2011-08-25 DIAGNOSIS — E663 Overweight: Secondary | ICD-10-CM | POA: Insufficient documentation

## 2011-08-25 DIAGNOSIS — D509 Iron deficiency anemia, unspecified: Secondary | ICD-10-CM | POA: Insufficient documentation

## 2011-08-25 DIAGNOSIS — I359 Nonrheumatic aortic valve disorder, unspecified: Secondary | ICD-10-CM | POA: Diagnosis present

## 2011-08-25 DIAGNOSIS — I10 Essential (primary) hypertension: Secondary | ICD-10-CM | POA: Diagnosis present

## 2011-08-25 DIAGNOSIS — Z6822 Body mass index (BMI) 22.0-22.9, adult: Secondary | ICD-10-CM | POA: Insufficient documentation

## 2011-08-25 DIAGNOSIS — K219 Gastro-esophageal reflux disease without esophagitis: Secondary | ICD-10-CM | POA: Insufficient documentation

## 2011-08-25 DIAGNOSIS — J449 Chronic obstructive pulmonary disease, unspecified: Secondary | ICD-10-CM | POA: Insufficient documentation

## 2011-08-25 DIAGNOSIS — D62 Acute posthemorrhagic anemia: Secondary | ICD-10-CM | POA: Diagnosis not present

## 2011-08-25 DIAGNOSIS — K560 Paralytic ileus: Secondary | ICD-10-CM | POA: Diagnosis not present

## 2011-08-25 DIAGNOSIS — Z6823 Body mass index (BMI) 23.0-23.9, adult: Secondary | ICD-10-CM | POA: Insufficient documentation

## 2011-08-25 HISTORY — PX: COLON RESECTION: SHX5231

## 2011-08-25 HISTORY — PX: ILEOSTOMY: SHX1783

## 2011-08-25 LAB — TYPE AND SCREEN
ABO/RH(D): A NEG
Antibody Screen: NEGATIVE

## 2011-08-25 LAB — BASIC METABOLIC PANEL
BUN: 9 mg/dL (ref 6–23)
CO2: 26 mEq/L (ref 19–32)
Calcium: 8.6 mg/dL (ref 8.4–10.5)
Chloride: 104 mEq/L (ref 96–112)
Creatinine, Ser: 0.73 mg/dL (ref 0.50–1.10)
GFR calc Af Amer: >90 mL/min (ref >90)
GFR calc non Af Amer: 87 mL/min — ABNORMAL LOW (ref >90)
Glucose, Bld: 135 mg/dL — ABNORMAL HIGH (ref 70–99)
Potassium: 4 mEq/L (ref 3.5–5.1)
Sodium: 138 mEq/L (ref 135–145)

## 2011-08-25 LAB — CBC
HCT: 30.9 % — ABNORMAL LOW (ref 36.0–46.0)
Hemoglobin: 9.9 g/dL — ABNORMAL LOW (ref 12.0–15.0)
MCH: 31 pg (ref 26.0–34.0)
MCHC: 32 g/dL (ref 30.0–36.0)
MCV: 96.9 fL (ref 78.0–100.0)
Platelets: 189 10*3/uL (ref 150–400)
RBC: 3.19 MIL/uL — ABNORMAL LOW (ref 3.87–5.11)
RDW: 15.8 % — ABNORMAL HIGH (ref 11.5–15.5)
WBC: 13.4 10*3/uL — ABNORMAL HIGH (ref 4.0–10.5)

## 2011-08-25 SURGERY — COLON RESECTION LAPAROSCOPIC
Anesthesia: General | Site: Abdomen | Wound class: Clean Contaminated

## 2011-08-25 MED ORDER — ALBUTEROL SULFATE HFA 108 (90 BASE) MCG/ACT IN AERS: INHALATION_SPRAY | RESPIRATORY_TRACT | Status: DC | PRN

## 2011-08-25 MED ORDER — LIDOCAINE-EPINEPHRINE 1 %-1:100000 IJ SOLN
INTRAMUSCULAR | Status: AC
  Filled 2011-08-25: qty 1

## 2011-08-25 MED ORDER — LACTATED RINGERS IR SOLN
Status: DC | PRN
  Administered 2011-08-25: 3000 mL

## 2011-08-25 MED ORDER — LACTATED RINGERS IV SOLN
INTRAVENOUS | Status: DC | PRN
  Administered 2011-08-25 (×5): via INTRAVENOUS

## 2011-08-25 MED ORDER — MORPHINE SULFATE (PF) 1 MG/ML IV SOLN
INTRAVENOUS | Status: DC
  Administered 2011-08-25: 6 mg via INTRAVENOUS
  Administered 2011-08-25: 1.5 mg via INTRAVENOUS
  Administered 2011-08-26: 4.09 mg via INTRAVENOUS
  Administered 2011-08-26: 3 mg via INTRAVENOUS
  Administered 2011-08-26 (×2): 4.5 mg via INTRAVENOUS
  Administered 2011-08-26: 3 mg via INTRAVENOUS
  Administered 2011-08-26: 3.2 mg via INTRAVENOUS
  Administered 2011-08-27: 07:00:00 via INTRAVENOUS
  Administered 2011-08-27: 5.87 mg via INTRAVENOUS
  Administered 2011-08-27: 9 mg via INTRAVENOUS
  Administered 2011-08-27 (×2): 4.5 mg via INTRAVENOUS
  Administered 2011-08-27: 7.5 mg via INTRAVENOUS
  Administered 2011-08-27: 4.5 mg via INTRAVENOUS
  Administered 2011-08-28: 02:00:00 via INTRAVENOUS
  Administered 2011-08-28: 4.5 mg via INTRAVENOUS
  Administered 2011-08-28: 7.5 mL via INTRAVENOUS
  Administered 2011-08-28: 7.5 mg via INTRAVENOUS
  Administered 2011-08-28: 4.6 mL via INTRAVENOUS
  Administered 2011-08-28: 1.5 mg via INTRAVENOUS
  Administered 2011-08-29: 3 mg via INTRAVENOUS
  Administered 2011-08-29: 4.5 mg via INTRAVENOUS
  Administered 2011-08-29: 3 mg via INTRAVENOUS
  Administered 2011-08-29: 1.5 mg via INTRAVENOUS
  Administered 2011-08-29: 4.5 mg via INTRAVENOUS
  Administered 2011-08-29: 1.5 mg via INTRAVENOUS
  Administered 2011-08-29: 07:00:00 via INTRAVENOUS
  Administered 2011-08-30: 1.5 mg via INTRAVENOUS
  Filled 2011-08-25 (×5): qty 25

## 2011-08-25 MED ORDER — ACETAMINOPHEN 10 MG/ML IV SOLN
INTRAVENOUS | Status: DC | PRN
  Administered 2011-08-25: 1000 mg via INTRAVENOUS

## 2011-08-25 MED ORDER — NALOXONE HCL 0.4 MG/ML IJ SOLN: 0.4000 mg | INTRAMUSCULAR | Status: DC | PRN

## 2011-08-25 MED ORDER — NEOSTIGMINE METHYLSULFATE 1 MG/ML IJ SOLN
INTRAMUSCULAR | Status: DC | PRN
  Administered 2011-08-25: 5 mg via INTRAVENOUS

## 2011-08-25 MED ORDER — ONDANSETRON HCL 4 MG/2ML IJ SOLN
4.0000 mg | Freq: Four times a day (QID) | INTRAMUSCULAR | Status: DC | PRN
  Administered 2011-08-26: 4 mg via INTRAVENOUS
  Filled 2011-08-25: qty 2

## 2011-08-25 MED ORDER — STERILE WATER FOR IRRIGATION IR SOLN
Status: DC | PRN
  Administered 2011-08-25: 1500 mL

## 2011-08-25 MED ORDER — ALVIMOPAN 12 MG PO CAPS
12.0000 mg | ORAL_CAPSULE | Freq: Once | ORAL | Status: AC
  Administered 2011-08-25: 12 mg via ORAL

## 2011-08-25 MED ORDER — PROPOFOL 10 MG/ML IV EMUL
INTRAVENOUS | Status: DC | PRN
  Administered 2011-08-25: 30 mg via INTRAVENOUS
  Administered 2011-08-25: 50 mg via INTRAVENOUS
  Administered 2011-08-25: 170 mg via INTRAVENOUS

## 2011-08-25 MED ORDER — TIOTROPIUM BROMIDE MONOHYDRATE 18 MCG IN CAPS
18.0000 ug | ORAL_CAPSULE | Freq: Every day | RESPIRATORY_TRACT | Status: DC
  Administered 2011-08-26 – 2011-09-03 (×9): 18 ug via RESPIRATORY_TRACT
  Filled 2011-08-25 (×2): qty 5

## 2011-08-25 MED ORDER — ENOXAPARIN SODIUM 40 MG/0.4ML ~~LOC~~ SOLN
40.0000 mg | SUBCUTANEOUS | Status: DC
  Administered 2011-08-25 – 2011-09-02 (×9): 40 mg via SUBCUTANEOUS
  Filled 2011-08-25 (×10): qty 0.4

## 2011-08-25 MED ORDER — FLUTICASONE-SALMETEROL 100-50 MCG/DOSE IN AEPB
1.0000 | INHALATION_SPRAY | Freq: Two times a day (BID) | RESPIRATORY_TRACT | Status: DC
  Administered 2011-08-25 – 2011-09-02 (×16): 1 via RESPIRATORY_TRACT
  Filled 2011-08-25: qty 14

## 2011-08-25 MED ORDER — ALBUTEROL SULFATE HFA 108 (90 BASE) MCG/ACT IN AERS
INHALATION_SPRAY | RESPIRATORY_TRACT | Status: AC
  Filled 2011-08-25: qty 6.7

## 2011-08-25 MED ORDER — SODIUM CHLORIDE 0.9 % IV SOLN
INTRAVENOUS | Status: AC
  Filled 2011-08-25: qty 50

## 2011-08-25 MED ORDER — ALBUTEROL SULFATE (5 MG/ML) 0.5% IN NEBU: 2.5000 mg | INHALATION_SOLUTION | RESPIRATORY_TRACT | Status: DC | PRN

## 2011-08-25 MED ORDER — LIDOCAINE HCL 1 % IJ SOLN
INTRAMUSCULAR | Status: AC
  Filled 2011-08-25: qty 20

## 2011-08-25 MED ORDER — DIPHENHYDRAMINE HCL 50 MG/ML IJ SOLN: 12.5000 mg | Freq: Four times a day (QID) | INTRAMUSCULAR | Status: DC | PRN

## 2011-08-25 MED ORDER — FENTANYL CITRATE 0.05 MG/ML IJ SOLN
INTRAMUSCULAR | Status: AC
  Filled 2011-08-25: qty 2

## 2011-08-25 MED ORDER — MONTELUKAST SODIUM 10 MG PO TABS
10.0000 mg | ORAL_TABLET | Freq: Every day | ORAL | Status: DC
  Administered 2011-08-25 – 2011-09-02 (×9): 10 mg via ORAL
  Filled 2011-08-25 (×10): qty 1

## 2011-08-25 MED ORDER — ONDANSETRON HCL 4 MG/2ML IJ SOLN: 4.0000 mg | Freq: Four times a day (QID) | INTRAMUSCULAR | Status: DC | PRN

## 2011-08-25 MED ORDER — PHENYLEPHRINE HCL 10 MG/ML IJ SOLN
INTRAMUSCULAR | Status: DC | PRN
  Administered 2011-08-25 (×2): 80 ug via INTRAVENOUS

## 2011-08-25 MED ORDER — ACETAMINOPHEN 10 MG/ML IV SOLN
INTRAVENOUS | Status: AC
  Filled 2011-08-25: qty 100

## 2011-08-25 MED ORDER — SODIUM CHLORIDE 0.9 % IR SOLN
Status: DC | PRN
  Administered 2011-08-25: 2000 mL

## 2011-08-25 MED ORDER — BUPIVACAINE-EPINEPHRINE PF 0.25-1:200000 % IJ SOLN
INTRAMUSCULAR | Status: AC
  Filled 2011-08-25: qty 30

## 2011-08-25 MED ORDER — BUPIVACAINE-EPINEPHRINE 0.25% -1:200000 IJ SOLN
INTRAMUSCULAR | Status: DC | PRN
  Administered 2011-08-25: 17.5 mL

## 2011-08-25 MED ORDER — BUPIVACAINE HCL (PF) 0.25 % IJ SOLN
INTRAMUSCULAR | Status: AC
  Filled 2011-08-25: qty 30

## 2011-08-25 MED ORDER — ROCURONIUM BROMIDE 100 MG/10ML IV SOLN
INTRAVENOUS | Status: DC | PRN
  Administered 2011-08-25: 10 mg via INTRAVENOUS
  Administered 2011-08-25: 20 mg via INTRAVENOUS
  Administered 2011-08-25: 50 mg via INTRAVENOUS
  Administered 2011-08-25: 10 mg via INTRAVENOUS
  Administered 2011-08-25: 20 mg via INTRAVENOUS
  Administered 2011-08-25: 10 mg via INTRAVENOUS

## 2011-08-25 MED ORDER — FLUTICASONE PROPIONATE 50 MCG/ACT NA SUSP
1.0000 | Freq: Every day | NASAL | Status: DC
  Administered 2011-08-25 – 2011-09-03 (×8): 1 via NASAL
  Filled 2011-08-25: qty 16

## 2011-08-25 MED ORDER — BUPIVACAINE ON-Q PAIN PUMP (FOR ORDER SET NO CHG)
INJECTION | Status: AC
  Filled 2011-08-25: qty 1

## 2011-08-25 MED ORDER — SUFENTANIL CITRATE 50 MCG/ML IV SOLN
INTRAVENOUS | Status: DC | PRN
  Administered 2011-08-25: 5 ug via INTRAVENOUS
  Administered 2011-08-25: 10 ug via INTRAVENOUS
  Administered 2011-08-25: 5 ug via INTRAVENOUS
  Administered 2011-08-25: 10 ug via INTRAVENOUS
  Administered 2011-08-25: 5 ug via INTRAVENOUS
  Administered 2011-08-25 (×7): 10 ug via INTRAVENOUS

## 2011-08-25 MED ORDER — DILTIAZEM HCL ER COATED BEADS 120 MG PO CP24
120.0000 mg | ORAL_CAPSULE | Freq: Every day | ORAL | Status: DC
  Administered 2011-08-25 – 2011-09-02 (×9): 120 mg via ORAL
  Filled 2011-08-25 (×10): qty 1

## 2011-08-25 MED ORDER — ACETAMINOPHEN 10 MG/ML IV SOLN
1000.0000 mg | Freq: Four times a day (QID) | INTRAVENOUS | Status: AC
  Administered 2011-08-25 – 2011-08-26 (×4): 1000 mg via INTRAVENOUS
  Filled 2011-08-25 (×5): qty 100

## 2011-08-25 MED ORDER — ONDANSETRON HCL 4 MG/2ML IJ SOLN
INTRAMUSCULAR | Status: DC | PRN
  Administered 2011-08-25: 4 mg via INTRAVENOUS

## 2011-08-25 MED ORDER — DROPERIDOL 2.5 MG/ML IJ SOLN
INTRAMUSCULAR | Status: DC | PRN
  Administered 2011-08-25: .625 mg via INTRAVENOUS

## 2011-08-25 MED ORDER — SODIUM CHLORIDE 0.9 % IJ SOLN: 9.0000 mL | INTRAMUSCULAR | Status: DC | PRN

## 2011-08-25 MED ORDER — HETASTARCH-ELECTROLYTES 6 % IV SOLN
INTRAVENOUS | Status: DC | PRN
  Administered 2011-08-25: 09:00:00 via INTRAVENOUS

## 2011-08-25 MED ORDER — FENTANYL CITRATE 0.05 MG/ML IJ SOLN
25.0000 ug | INTRAMUSCULAR | Status: DC | PRN
  Administered 2011-08-25 (×4): 25 ug via INTRAVENOUS

## 2011-08-25 MED ORDER — GLYCOPYRROLATE 0.2 MG/ML IJ SOLN
INTRAMUSCULAR | Status: DC | PRN
  Administered 2011-08-25: .8 mg via INTRAVENOUS

## 2011-08-25 MED ORDER — KCL IN DEXTROSE-NACL 20-5-0.45 MEQ/L-%-% IV SOLN
INTRAVENOUS | Status: DC
  Administered 2011-08-25 – 2011-08-28 (×7): via INTRAVENOUS
  Filled 2011-08-25 (×11): qty 1000

## 2011-08-25 MED ORDER — SODIUM CHLORIDE 0.9 % IV SOLN
1.0000 g | INTRAVENOUS | Status: AC
  Administered 2011-08-25: 1 g via INTRAVENOUS

## 2011-08-25 MED ORDER — MIDAZOLAM HCL 5 MG/5ML IJ SOLN
INTRAMUSCULAR | Status: DC | PRN
  Administered 2011-08-25: 2 mg via INTRAVENOUS

## 2011-08-25 MED ORDER — LIDOCAINE HCL (PF) 1 % IJ SOLN
INTRAMUSCULAR | Status: DC | PRN
  Administered 2011-08-25: 12.5 mL

## 2011-08-25 MED ORDER — DIPHENHYDRAMINE HCL 12.5 MG/5ML PO ELIX
12.5000 mg | ORAL_SOLUTION | Freq: Four times a day (QID) | ORAL | Status: DC | PRN
  Filled 2011-08-25: qty 5

## 2011-08-25 MED ORDER — ONDANSETRON HCL 4 MG PO TABS: 4.0000 mg | ORAL_TABLET | Freq: Four times a day (QID) | ORAL | Status: DC | PRN

## 2011-08-25 MED ORDER — BUPIVACAINE 0.25 % ON-Q PUMP DUAL CATH 300 ML
300.0000 mL | INJECTION | Status: DC
  Administered 2011-08-25: 300 mL
  Filled 2011-08-25: qty 300

## 2011-08-25 MED ORDER — ALVIMOPAN 12 MG PO CAPS
12.0000 mg | ORAL_CAPSULE | Freq: Two times a day (BID) | ORAL | Status: AC
  Administered 2011-08-26 – 2011-09-01 (×13): 12 mg via ORAL
  Filled 2011-08-25 (×14): qty 1

## 2011-08-25 SURGICAL SUPPLY — 93 items
APPLIER CLIP 5 13 M/L LIGAMAX5 (MISCELLANEOUS)
APPLIER CLIP ROT 10 11.4 M/L (STAPLE)
BENZOIN TINCTURE PRP APPL 2/3 (GAUZE/BANDAGES/DRESSINGS) ×2 IMPLANT
BLADE EXTENDED COATED 6.5IN (ELECTRODE) ×2 IMPLANT
BLADE HEX COATED 2.75 (ELECTRODE) ×2 IMPLANT
BLADE SURG SZ10 CARB STEEL (BLADE) ×2 IMPLANT
CABLE HIGH FREQUENCY MONO STRZ (ELECTRODE) ×2 IMPLANT
CANISTER SUCTION 2500CC (MISCELLANEOUS) ×2 IMPLANT
CANNULA ENDOPATH XCEL 11M (ENDOMECHANICALS) IMPLANT
CATH KIT ON-Q SILVERSOAK 5IN (CATHETERS) ×4 IMPLANT
CELLS DAT CNTRL 66122 CELL SVR (MISCELLANEOUS) IMPLANT
CHLORAPREP W/TINT 26ML (MISCELLANEOUS) ×2 IMPLANT
CLIP APPLIE 5 13 M/L LIGAMAX5 (MISCELLANEOUS) IMPLANT
CLIP APPLIE ROT 10 11.4 M/L (STAPLE) IMPLANT
CLOSURE STERI STRIP 1/2 X4 (GAUZE/BANDAGES/DRESSINGS) ×2 IMPLANT
CLOTH BEACON ORANGE TIMEOUT ST (SAFETY) ×2 IMPLANT
COVER MAYO STAND STRL (DRAPES) ×2 IMPLANT
COVER SURGICAL LIGHT HANDLE (MISCELLANEOUS) ×2 IMPLANT
DECANTER SPIKE VIAL GLASS SM (MISCELLANEOUS) ×2 IMPLANT
DISSECTOR BLUNT TIP ENDO 5MM (MISCELLANEOUS) IMPLANT
DRAPE LAPAROSCOPIC ABDOMINAL (DRAPES) IMPLANT
DRAPE LG THREE QUARTER DISP (DRAPES) ×4 IMPLANT
DRAPE UTILITY XL STRL (DRAPES) ×2 IMPLANT
DRAPE WARM FLUID 44X44 (DRAPE) ×4 IMPLANT
DRSG TEGADERM 2-3/8X2-3/4 SM (GAUZE/BANDAGES/DRESSINGS) ×2 IMPLANT
DRSG TEGADERM 4X4.75 (GAUZE/BANDAGES/DRESSINGS) ×2 IMPLANT
DRSG TELFA 4X14 ISLAND ADH (GAUZE/BANDAGES/DRESSINGS) ×2 IMPLANT
ELECT REM PT RETURN 9FT ADLT (ELECTROSURGICAL) ×2
ELECTRODE REM PT RTRN 9FT ADLT (ELECTROSURGICAL) ×1 IMPLANT
FILTER SMOKE EVAC LAPAROSHD (FILTER) IMPLANT
GLOVE BIO SURGEON STRL SZ 6 (GLOVE) ×4 IMPLANT
GLOVE BIO SURGEON STRL SZ7.5 (GLOVE) ×2 IMPLANT
GLOVE BIOGEL M STRL SZ7.5 (GLOVE) ×2 IMPLANT
GLOVE BIOGEL PI IND STRL 6.5 (GLOVE) ×1 IMPLANT
GLOVE BIOGEL PI IND STRL 7.0 (GLOVE) ×1 IMPLANT
GLOVE BIOGEL PI INDICATOR 6.5 (GLOVE) ×1
GLOVE BIOGEL PI INDICATOR 7.0 (GLOVE) ×1
GLOVE INDICATOR 6.5 STRL GRN (GLOVE) ×4 IMPLANT
GOWN PREVENTION PLUS XLARGE (GOWN DISPOSABLE) ×4 IMPLANT
GOWN PREVENTION PLUS XXLARGE (GOWN DISPOSABLE) ×4 IMPLANT
GOWN STRL NON-REIN LRG LVL3 (GOWN DISPOSABLE) IMPLANT
KIT BASIN OR (CUSTOM PROCEDURE TRAY) ×2 IMPLANT
LIGASURE IMPACT 36 18CM CVD LR (INSTRUMENTS) IMPLANT
NS IRRIG 1000ML POUR BTL (IV SOLUTION) ×4 IMPLANT
PENCIL BUTTON HOLSTER BLD 10FT (ELECTRODE) ×2 IMPLANT
RELOAD PROXIMATE 75MM BLUE (ENDOMECHANICALS) ×2 IMPLANT
RTRCTR WOUND ALEXIS 18CM MED (MISCELLANEOUS)
SCALPEL HARMONIC ACE (MISCELLANEOUS) IMPLANT
SCISSORS LAP 5X35 DISP (ENDOMECHANICALS) ×2 IMPLANT
SEALER TISSUE G2 CVD JAW 35 (ENDOMECHANICALS) ×1 IMPLANT
SEALER TISSUE G2 CVD JAW 45CM (ENDOMECHANICALS) ×1
SET IRRIG TUBING LAPAROSCOPIC (IRRIGATION / IRRIGATOR) ×2 IMPLANT
SHEET LAVH (DRAPES) IMPLANT
SLEEVE ENDOPATH XCEL 5M (ENDOMECHANICALS) ×2 IMPLANT
SLEEVE SURGEON STRL (DRAPES) ×2 IMPLANT
SOLUTION ANTI FOG 6CC (MISCELLANEOUS) IMPLANT
SPONGE GAUZE 4X4 12PLY (GAUZE/BANDAGES/DRESSINGS) ×2 IMPLANT
SPONGE LAP 18X18 X RAY DECT (DISPOSABLE) ×4 IMPLANT
STAPLER CIRC CVD 29MM 37CM (STAPLE) ×2 IMPLANT
STAPLER CUT CVD 40MM GREEN (STAPLE) ×2 IMPLANT
STAPLER PROXIMATE 75MM BLUE (STAPLE) ×2 IMPLANT
STAPLER VISISTAT 35W (STAPLE) ×2 IMPLANT
SUCTION POOLE TIP (SUCTIONS) ×2 IMPLANT
SUT PDS AB 0 CTX 60 (SUTURE) IMPLANT
SUT PDS AB 1 CTX 36 (SUTURE) ×4 IMPLANT
SUT PROLENE 0 SH 30 (SUTURE) ×2 IMPLANT
SUT PROLENE 2 0 SH DA (SUTURE) IMPLANT
SUT VIC AB 0 CTX 27 (SUTURE) ×2 IMPLANT
SUT VIC AB 2-0 CT1 27 (SUTURE)
SUT VIC AB 2-0 CT1 TAPERPNT 27 (SUTURE) IMPLANT
SUT VIC AB 2-0 CTX 36 (SUTURE) IMPLANT
SUT VIC AB 2-0 SH 18 (SUTURE) ×2 IMPLANT
SUT VIC AB 3-0 SH 18 (SUTURE) ×2 IMPLANT
SUT VIC AB 4-0 PS2 27 (SUTURE) ×12 IMPLANT
SUT VICRYL 2 0 18  UND BR (SUTURE) ×1
SUT VICRYL 2 0 18 UND BR (SUTURE) ×1 IMPLANT
SUT VICRYL 3 0 BR 18  UND (SUTURE)
SUT VICRYL 3 0 BR 18 UND (SUTURE) IMPLANT
SYR BULB IRRIGATION 50ML (SYRINGE) ×2 IMPLANT
SYS LAPSCP GELPORT 120MM (MISCELLANEOUS) ×2
SYSTEM LAPSCP GELPORT 120MM (MISCELLANEOUS) ×1 IMPLANT
TAPE UMBILICAL COTTON 1/8X30 (MISCELLANEOUS) ×2 IMPLANT
TOWEL OR 17X26 10 PK STRL BLUE (TOWEL DISPOSABLE) ×2 IMPLANT
TRAY FOLEY CATH 14FRSI W/METER (CATHETERS) ×2 IMPLANT
TRAY LAP CHOLE (CUSTOM PROCEDURE TRAY) ×2 IMPLANT
TROCAR BLADELESS OPT 5 100 (ENDOMECHANICALS) ×2 IMPLANT
TROCAR BLADELESS OPT 5 75 (ENDOMECHANICALS) ×4 IMPLANT
TROCAR XCEL BLUNT TIP 100MML (ENDOMECHANICALS) ×2 IMPLANT
TROCAR XCEL NON-BLD 11X100MML (ENDOMECHANICALS) IMPLANT
TUBING FILTER THERMOFLATOR (ELECTROSURGICAL) ×2 IMPLANT
TUNNELER SHEATH ON-Q 16GX12 DP (PAIN MANAGEMENT) ×2 IMPLANT
YANKAUER SUCT BULB TIP 10FT TU (MISCELLANEOUS) ×2 IMPLANT
YANKAUER SUCT BULB TIP NO VENT (SUCTIONS) ×2 IMPLANT

## 2011-08-25 NOTE — Anesthesia Procedure Notes (Addendum)
Procedure Name: Intubation Date/Time: 08/25/2011 8:22 AM Performed by: Illene Silver Pre-anesthesia Checklist: Patient identified, Emergency Drugs available, Suction available, Patient being monitored and Timeout performed Patient Re-evaluated:Patient Re-evaluated prior to inductionOxygen Delivery Method: Circle System Utilized Preoxygenation: Pre-oxygenation with 100% oxygen Intubation Type: IV induction Ventilation: Mask ventilation without difficulty Laryngoscope Size: Miller and 2 Grade View: Grade I Tube type: Oral Tube size: 7.5 mm    Date/Time: 08/25/2011 8:22 AM Performed by: Anastasio Champion E Number of attempts: 1 Secured at: 21 cm Dental Injury: Teeth and Oropharynx as per pre-operative assessment     Performed by: Anastasio Champion E    Spinal

## 2011-08-25 NOTE — Preoperative (Signed)
Beta Blockers   Reason not to administer Beta Blockers:Not Applicable 

## 2011-08-25 NOTE — Transfer of Care (Signed)
Immediate Anesthesia Transfer of Care Note  Patient: [name]  Procedure(s) Performed:  COLON RESECTION LAPAROSCOPIC - Laparoscopic Assisted Low Anterior Resection Diverting Ostomy and onQ pain pump  Patient Location: PACU  Anesthesia Type: General  Level of Consciousness: awake, sedated, patient cooperative and responds to stimulation  Airway & Oxygen Therapy: Patient Spontanous Breathing and Patient connected to face mask oxygen  Post-op Assessment: Report given to PACU RN and Post -op Vital signs reviewed and stable  Post vital signs: stable  Complications: No apparent anesthesia complications

## 2011-08-25 NOTE — Progress Notes (Signed)
Completed bowel prep as directed by doctor. Pre-op

## 2011-08-25 NOTE — Anesthesia Preprocedure Evaluation (Signed)
Anesthesia Evaluation  Patient identified by MRN, date of birth, ID band Patient awake    Reviewed: Allergy & Precautions, H&P , NPO status , Patient's Chart, lab work & pertinent test results  History of Anesthesia Complications (+) PONV  Airway Mallampati: II TM Distance: <3 FB Neck ROM: Full    Dental  (+) Caps and Teeth Intact   Pulmonary neg pulmonary ROS, asthma , sleep apnea , COPD   Pulmonary exam normal       Cardiovascular hypertension, neg cardio ROS Regular Normal    Neuro/Psych Negative Neurological ROS  Negative Psych ROS   GI/Hepatic negative GI ROS, Neg liver ROS, GERD-  ,  Endo/Other  Negative Endocrine ROS  Renal/GU negative Renal ROS  Genitourinary negative   Musculoskeletal negative musculoskeletal ROS (+)   Abdominal   Peds negative pediatric ROS (+)  Hematology negative hematology ROS (+)   Anesthesia Other Findings   Reproductive/Obstetrics negative OB ROS                           Anesthesia Physical Anesthesia Plan  ASA: III  Anesthesia Plan: General   Post-op Pain Management:    Induction: Intravenous  Airway Management Planned: Oral ETT  Additional Equipment:   Intra-op Plan:   Post-operative Plan: Extubation in OR  Informed Consent: I have reviewed the patients History and Physical, chart, labs and discussed the procedure including the risks, benefits and alternatives for the proposed anesthesia with the patient or authorized representative who has indicated his/her understanding and acceptance.   Dental advisory given  Plan Discussed with: CRNA  Anesthesia Plan Comments:         Anesthesia Quick Evaluation

## 2011-08-25 NOTE — Op Note (Signed)
Laparoscopic Partial Colectomy Procedure Note  Indications: This patient presents for a laparoscopic partial colectomy for Rectal Cancer, [id number]  Pre-operative Diagnosis:  Rectal Cancer, [id number]  Post-operative Diagnosis:  Rectal Cancer, [id number]  Surgeon: [name]   Assistants: Karie Soda  Anesthesia: General endotracheal anesthesia  ASA Class: 3  Procedure Details  The patient was seen in the Holding Room. The risks, benefits, complications, treatment options, and expected outcomes were discussed with the patient. The possibilities of reaction to medication, bleeding,  Infection, recurrent cancer, the need for additional procedures, failure to diagnose a condition, and creating a complication requiring transfusion or operation were discussed with the patient. The patient was advised of the risk of ostomy.  The patient concurred with the proposed plan, giving informed consent.   The patient was taken to the operating room, identified, and the procedure verified as low anterior resection.   A Time Out was held and the above information confirmed.  The patient was brought to the operating room and placed supine. After induction of a general anesthetic, a Foley catheter was inserted and the abdomen was prepped and draped in standard fashion. Local anesthetic was infiltrated around the umbilicus. The skin was incised vertically with a #11 blade.  The subcutaneous tissues were divided bluntly with a Kelly clamp.  The fascia was elevated and incised with a #11 blade.  A Tresa Endo was used to confirm entrance into the peritoneal cavity.  A 0 Vicryl pursestring suture was placed around the fascial incision.  The Hasson was introduced into the abdomen and held in place to the abdominal wall with the tails of the suture.  Pneumoperitoneum was insufflated to a pressure of 15 mm Hg. The laparoscope was introduced.    Exploration revealed a normal omentum, colon, small bowel, peritoneum, liver, and  stomach. Two left lateral and one right lateral 5-mm trocars were then placed after anesthetizing the skin and peritoneum with Marcaine. An additional 5 mm trocar was placed in the suprapubic area.  The descending colon and sigmoid colon were then mobilized with gentle retraction of the colon in a medial direction with mobilization of the peritoneal reflection with cautery and the Enseal. Mobilization of this area was complete to expose the retroperitoneum. The ureter was  identified during this mobilization process but no structures were divided during this mobilization. There was no blood loss during this portion of the procedure.      The mobilization continued to include the rectum circumferentially down into the pelvis.  The left and right hypogastric plexus was identified and avoided.  Posteriorly, care was taken to preserve the mesorectum.  The suprapubic port was converted to a hand port to assist with visualization.   The sigmoid was transected with the GIA 75 mm stapler.  The mesentery was divided high at the inferior mesenteric artery with clamps and suture ligation.  The remaining mesentery was divided with the EnSeal.   The colon was resected distally with the Contour stapler 3 cm distal to the area in question in regard to the specimen. The proximal end was marked with a suture, and the specimen was submitted to pathology.  Additional sigmoid was taken due to positioning.    The end of the colon was sized and a 29 mm EEA was selected.  A 0 Prolene suture was used to create a pursestring suture around the anvil.  The colon was dropped back into the abdomen and pneumoperitoneum was reachieved.    The EEA was advanced through the rectum.  The spike was deployed through the rectal wall.  Prior to deployment, the vagina was checked to confirm that the stapler was in the rectum.  The anvil was coupled to the spike.  The surrounding tissues were held out of the way and the stapler closed.  The position  was checked to make sure that the colon was not twisted, and that it was not under tension.  Some of the omentum was freed off the sigmoid to avoid tension.  The stapler was fired and pressure was held for 30 seconds.  The stapler was opened 3 half turns and removed.    The proctoscope was advanced through the anus and the rectum was insufflated.  There was no evidence of a leak.  The pelvis was irrigated and there was no evidence of bleeding.  There was no evidence of metastatic disease in the liver, either.    An appropriate loop of bowel was selected for the ileostomy.  The previously marked ileostomy site was opened.  The muscle was separated.  The loop of bowel for the ostomy was pulled through the abdominal wall.      The OnQ pump catheters were placed with the tunneler.  The peritoneum was closed with a 0-0 Vicryl.  The fascial incision was then closed with a running #1 PDS suture. The 12-mm trocar incisions were closed with a 0 Vicryl suture. The soft tissue was irrigated and the incisions were closed with 4-0 Vicryl subcuticular closure. The gel port incision had umbilical tape wicks placed.  Steri-Strips were applied.   The ostomy was matured with 4 4-0 Vicryl sutures.  The ostomy appliance was applied.    Instrument, sponge, and needle counts were correct prior to abdominal closure and at the conclusion of the case.   Findings:  Bulky rectal cancer at 10 cm  Estimated Blood Loss: 125 mL         Drains: None           Total IV Fluids: 4000 crystalloid, 500 colloid         Specimens:  Rectosigmoid colon, stitch marks proximal end, additional sigmoid colon and anastamotic rings            Complications: None; patient tolerated the procedure well.         Disposition: PACU - hemodynamically stable.         Condition: stable

## 2011-08-25 NOTE — H&P (Signed)
Chief Complaint   Patient presents with   .  Other     rectal cancer   HISTORY:  Pt is a 65 yo female who presents with anemia. She was found to have a worsening heart murmur, which led to her being evaluated by Dr. Alanda Amass. Labs revealed significant anemia. 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 Staging workup reveals clinical stage III disease. She has been experiencing some symptoms of mild weight loss, constipation, and dark stools. She has not had pain, nausea or vomiting. She is a Agricultural consultant at [employer]. She has not experienced bloating either. She had been taking milk of magnesia for a laxative.  She has undergone neoadjuvant chemoradiation and is ready for surgery.    Past Medical History   Diagnosis  Date   .  IBS (irritable bowel syndrome)    .  Hyperlipidemia    .  Allergic rhinitis    .  History of pneumonia    .  COPD (chronic obstructive pulmonary disease)    .  Asthma    .  Heart murmur      Aortic   .  Lung nodule      RLL nodule-65mm stable 2006, April 2009, and June 2009   .  Candidiasis of breast    .  Anemia    .  Hyperlipemia    .  Obesity    .  Unspecified essential hypertension    .  Heart valve problem      PER CARDIOLOGIST 05-03-2011   .  Sleep difficulties    .  Shortness of breath    .  Hemorrhoids    .  Cancer      rectal    Current Outpatient Prescriptions   Medication  Sig  Dispense  Refill   .  albuterol (PROVENTIL) (2.5 MG/3ML) 0.083% nebulizer solution  Take 2.5 mg by nebulization every 6 (six) hours as needed.     Marland Kitchen  albuterol (PROVENTIL,VENTOLIN) 90 MCG/ACT inhaler  Inhale 2 puffs into the lungs every 6 (six) hours as needed.     Marland Kitchen  aspirin 325 MG EC tablet  Take 81 mg by mouth daily.     .  Calcium Carbonate-Vitamin D (CALCIUM 600 + D PO)  Take by mouth.     .  dextromethorphan-guaiFENesin (MUCINEX DM) 30-600 MG per 12 hr tablet  Take 1 tablet by mouth  daily.     Marland Kitchen  diltiazem (CARDIZEM) 120 MG tablet  Take 120 mg by mouth 1 dose over 24 hours.     .  Fluticasone-Salmeterol (ADVAIR DISKUS) 100-50 MCG/DOSE AEPB  Inhale 1 puff into the lungs every 12 (twelve) hours.     .  folic acid (FOLVITE) 1 MG tablet      .  losartan (COZAAR) 50 MG tablet  Take 50 mg by mouth daily.     Marland Kitchen  lovastatin (MEVACOR) 40 MG tablet  TAKE 1 TABLET EVERY DAY  30 tablet  1   .  mometasone (NASONEX) 50 MCG/ACT nasal spray  2 sprays by Nasal route daily as needed.     .  pantoprazole (PROTONIX) 40 MG tablet      .  SINGULAIR 10 MG tablet  take 1 tablet by mouth at bedtime  30 tablet  1   .  tiotropium (SPIRIVA HANDIHALER) 18 MCG inhalation capsule  Place 1 capsule (18 mcg total)  into inhaler and inhale daily.  30 capsule  1   .  prochlorperazine (COMPAZINE) 10 MG tablet  Ad lib.     .  XELODA 500 MG tablet  BID times 48H.       Allergies   Allergen  Reactions   .  Codeine    .  Prednisone    .  Sulfonamide Derivatives     Family History   Problem  Relation  Age of Onset   .  Lung cancer  Father    .  Cancer  Father       lung    .  Diabetes     .  Heart disease     .  Heart disease  Mother    .  Diabetes  Mother     History    Social History   .  Marital Status:  Married     Spouse Name:  N/A     Number of Children:  N/A   .  Years of Education:  N/A    Social History Main Topics   .  Smoking status:  Former Smoker -- 1.5 packs/day for 10 years     Types:  Cigarettes     Quit date:  12/28/1974   .  Smokeless tobacco:  Never Used   .  Alcohol Use:  Yes      rare   .  Drug Use:  No   .  Sexually Active:  None    Other Topics  Concern   .  None    Social History Narrative   .  None     REVIEW OF SYSTEMS - PERTINENT POSITIVES ONLY:  Positive for fatigue, shortness of breath, easy bruising, rectal bleeding, and joint pain.  EXAM:  Filed Vitals:    05/26/11 1436   BP:  138/70   Pulse:  98   Temp:  97.4 F (36.3 C)   HENT:  Head:  Normocephalic and atraumatic.  Eyes: Conjunctivae are normal. Pupils are equal, round, and reactive to light. No scleral icterus.  Neck: Normal range of motion. Neck supple. No tracheal deviation present. No thyromegaly present.  Cardiovascular: Normal rate, regular rhythm, and intact distal pulses. Exam reveals prominent systolic murmur.  No murmur heard.  Respiratory: Effort normal and breath sounds normal. No respiratory distress. No wheezes, rales or rhonchi, no chest wall tenderness.  GI: Soft. Bowel sounds are normal. The abdomen is soft and nontender. There is no rebound and no guarding.  Musculoskeletal: Normal range of motion. Extremities are nontender. R side is marked for ileostomy Lymphadenopathy: No cervical, preauricular, postauricular or axillary adenopathy is present Neurological: Alert and oriented to person, place, and time. Coordination normal.  Skin: Skin is warm and dry. No rash noted. No diaphoresis. No erythema. No pallor.  Psychiatric: Normal mood and affect. Behavior is normal. Judgment and thought content normal.   LABORATORY RESULTS:  Labs are reviewed  RADIOLOGY RESULTS:  Images and reports reviewed.  PET negative for distant disease  EUS and PET positive for local nodal disease  Colonoscopy report reviewed.    ASSESSMENT AND PLAN:   CARDIAC MURMUR, AORTIC  Will need cardiac clearance for surgery by Dr. Alanda Amass  Reportedly has negative stress test  CHRONIC OBSTRUCTIVE PULMONARY DISEASE, ACUTE EXACERBATION  Will need aggressive pulmonary toilet post operatively  May need pulmonary consult as inpatient if patient has difficulty post op  Iron deficiency anemia  Taking iron supplements  Having significant nausea with  them.    Rectal cancer, dx with anemia, 10 cm from anal verge uT3N0  Finished XRT Discussed lap LAR with pt and the reasonable likelihood of needing a temporary ileostomy.  Discussed risks, benefits of surgery.  See office note for more  lengthy description.  Maudry Diego MD  Surgical Oncology, General and Endocrine Surgery  [facility]    Visit Diagnoses:  1.  Rectal cancer, dx with anemia, 10 cm from anal verge uT3N0   2.  CARDIAC MURMUR, AORTIC   3.  CHRONIC OBSTRUCTIVE PULMONARY DISEASE, ACUTE EXACERBATION   4.  Iron deficiency anemia   Care Team:  Illene Regulus, MD, MD  Dr. Christella Hartigan  Dr. Dalene Carrow  Dr. Mitzi Hansen  Dr. Alanda Amass [facility] cardiology)  Dr. Marchelle Gearing

## 2011-08-26 DIAGNOSIS — C20 Malignant neoplasm of rectum: Principal | ICD-10-CM

## 2011-08-26 LAB — CBC
HCT: 27.8 % — ABNORMAL LOW (ref 36.0–46.0)
Hemoglobin: 9.2 g/dL — ABNORMAL LOW (ref 12.0–15.0)
MCH: 31.9 pg (ref 26.0–34.0)
MCHC: 33.1 g/dL (ref 30.0–36.0)
MCV: 96.5 fL (ref 78.0–100.0)
Platelets: 169 10*3/uL (ref 150–400)
RBC: 2.88 MIL/uL — ABNORMAL LOW (ref 3.87–5.11)
RDW: 15.6 % — ABNORMAL HIGH (ref 11.5–15.5)
WBC: 9.1 10*3/uL (ref 4.0–10.5)

## 2011-08-26 LAB — BASIC METABOLIC PANEL
BUN: 8 mg/dL (ref 6–23)
CO2: 26 mEq/L (ref 19–32)
Calcium: 8.4 mg/dL (ref 8.4–10.5)
Chloride: 105 mEq/L (ref 96–112)
Creatinine, Ser: 0.65 mg/dL (ref 0.50–1.10)
GFR calc Af Amer: >90 mL/min (ref >90)
GFR calc non Af Amer: >90 mL/min (ref >90)
Glucose, Bld: 126 mg/dL — ABNORMAL HIGH (ref 70–99)
Potassium: 4 mEq/L (ref 3.5–5.1)
Sodium: 137 mEq/L (ref 135–145)

## 2011-08-26 LAB — PHOSPHORUS: Phosphorus: 3.4 mg/dL (ref 2.3–4.6)

## 2011-08-26 LAB — MAGNESIUM: Magnesium: 1.9 mg/dL (ref 1.5–2.5)

## 2011-08-26 MED ORDER — PROMETHAZINE HCL 25 MG/ML IJ SOLN
6.2500 mg | Freq: Four times a day (QID) | INTRAMUSCULAR | Status: DC | PRN
  Administered 2011-08-26 – 2011-08-27 (×3): 6.25 mg via INTRAVENOUS
  Filled 2011-08-26 (×2): qty 1

## 2011-08-26 MED ORDER — PROMETHAZINE HCL 25 MG/ML IJ SOLN
INTRAMUSCULAR | Status: AC
  Filled 2011-08-26: qty 1

## 2011-08-26 MED ORDER — DIBUCAINE 1 % RE OINT
TOPICAL_OINTMENT | RECTAL | Status: DC | PRN
Start: 2011-08-26 — End: 2011-09-03
  Filled 2011-08-26: qty 28

## 2011-08-26 MED ORDER — PROMETHAZINE HCL 25 MG/ML IJ SOLN
INTRAMUSCULAR | Status: AC
  Administered 2011-08-26: 6.25 mg via INTRAVENOUS
  Filled 2011-08-26: qty 1

## 2011-08-26 NOTE — Progress Notes (Signed)
1 Day Post-Op  Subjective: Pt doing well.  She is having some soreness of her bottom.  She denies n/v/singletuss.  She is not having any shortness of breath.  She had some oozing of her incision last night, and the dressing was changed.    Objective: Vital signs in last 24 hours: Temp:  [96.8 F (36 C)-98.4 F (36.9 C)] 96.8 F (36 C) (11/16 0403) Pulse Rate:  [65-89] 66  (11/16 0600) Resp:  [12-18] 12  (11/16 0600) BP: (99-129)/(40-56) 101/44 mmHg (11/16 0600) SpO2:  [93 %-100 %] 99 % (11/16 0600) Weight:  [237 lb 10.5 oz (107.8 kg)-239 lb 3.2 oz (108.5 kg)] 239 lb 3.2 oz (108.5 kg) (11/16 0000)    Intake/Output from previous day: 11/15 0701 - 11/16 0700 In: 6310 [I.V.:5510; IV Piggyback:800] Out: 1215 [Urine:1090; Blood:125] Intake/Output this shift:    General appearance: alert and no distress Head: Normocephalic, without obvious abnormality, atraumatic GI: Soft, non distended.  Wounds c/d/intact/ onQ in place Extremities: extremities normal, atraumatic, no cyanosis or edema Skin: Skin color, texture, turgor normal. No rashes or lesions Incision/Wound: Dressings OK.  Lab Results:   [facility] 08/26/11 0336 08/25/11 1326  WBC 9.1 13.4*  HGB 9.2* 9.9*  HCT 27.8* 30.9*  PLT 169 189   BMET  Basename 08/26/11 0336 08/25/11 1326  NA 137 138  K 4.0 4.0  CL 105 104  CO2 26 26  GLUCOSE 126* 135*  BUN 8 9  CREATININE 0.65 0.73  CALCIUM 8.4 8.6  Anti-infectives: Anti-infectives     Start     Dose/Rate Route Frequency Ordered Stop   08/25/11 0545   ertapenem (INVANZ) 1 g in sodium chloride 0.9 % 50 mL IVPB        1 g 100 mL/hr over 30 Minutes Intravenous 60 min pre-op 08/25/11 0540 08/25/11 0810          Assessment/Plan: s/p Procedure(s): COLON RESECTION LAPAROSCOPIC Incentive spirometry. Ambulate Continue PCA Dibucaine ointment to anus Sips of clears.    LOS: 1 day    [facility] 08/26/2011

## 2011-08-26 NOTE — Consult Note (Signed)
[name] [id number] 19-Aug-1946 65 y.o. 08/26/2011 1:11 PM   Referring Physician: Dr. Donell Beers  Reason For Consult:  Rectal Cancer.   CC: [name]  Identifying Statement: Rectal Cancer  History of Present Illness:  [name] is a pleasant 65 year old woman with uT3 N1 low-lying invasive rectal adenocarcinoma.  She is status post concurrent Xeloda with radiation therapy from 05/30/2011 through 07/07/2011.   She is status post day 2 for lap assisted partial colectomy.     Past Medical History:  Presumed IBS, allergic rhinitis, a history of pneumonia, COPD, asthma, hyperlipidemia, back surgery x2 in '06 and '07, and status post bilateral carpal tunnel repair.    Allergies:  Allergies  Allergen Reactions  . Codeine Nausea Only  . Prednisone Hives    In different places all over the body.  . Sulfonamide Derivatives Nausea Only    Medications: I have reviewed the patient's current medications.  Social History:   reports that she quit smoking about 36 years ago. Her smoking use included Cigarettes. She has a 15 pack-year smoking history. She has never used smokeless tobacco. She reports that she drinks alcohol. She reports that she does not use illicit drugs.  Family History:  Family History  Problem Relation Age of Onset  . Lung cancer Father   . Cancer Father     lung  . Diabetes    . Heart disease    . Heart disease Mother   . Diabetes Mother     Physical Exam:  Vitals: Blood pressure 113/39, pulse 67, temperature 97.8 F (36.6 C), temperature source Oral, resp. rate 15, height 5\' 8"  (1.727 m), weight 239 lb 3.2 oz (108.5 kg), SpO2 100.00%.  General appearance: alert, cooperative, fatigued and no distress Head: Normocephalic, without obvious abnormality, atraumatic Neck: no adenopathy, no carotid bruit, no JVD and supple, symmetrical, trachea midline Lymph nodes: Cervical, supraclavicular, and axillary nodes normal. Chest: Clear to percussion.   Decreased in both bases. CVS: Heart sounds present. Abdomen: Tender. Decreased bowel sounds. Extremities: + edema. No calf tenderness.  Lab Results:  08/26/2011 03:36 WBC: 9.1 RBC: 2.88 (L) HGB: 9.2 (L) HCT: 27.8 (L) MCV: 96.5 MCH: 31.9 MCHC: 33.1 RDW: 15.6 (H) Platelets: 169   Sodium: 137 Potassium: 4.0 Chloride: 105 CO2: 26 BUN: 8 Creat: 0.65 Calcium: 8.4 Glucose: 126  Phosphorus: 3.4 Magnesium: 1.9   Impression and Plan: 65 yr old s/p lap assisted partial colectomy for uT3 invasive adenocarcinoma of the rectum.  Final pathology report is still pending. Patient told that she will need to recover from surgery.  Following discharge, plans have been made for her to follow up in the out patient clinic to discuss final recommendations.   Call for questions.  Thanks  Kenlee Vogt I.

## 2011-08-26 NOTE — Progress Notes (Signed)
Chart reviewed and UR completed. 

## 2011-08-26 NOTE — Anesthesia Postprocedure Evaluation (Signed)
  Anesthesia Post-op Note  Patient: [name]  Procedure(s) Performed:  COLON RESECTION LAPAROSCOPIC - Laparoscopic Assisted Low Anterior Resection Diverting Ostomy and onQ pain pump  Patient Location: PACU  Anesthesia Type: General  Level of Consciousness: awake and alert   Airway and Oxygen Therapy: Patient Spontanous Breathing  Post-op Pain: mild  Post-op Assessment: Post-op Vital signs reviewed, Patient's Cardiovascular Status Stable, Respiratory Function Stable, Patent Airway and No signs of Nausea or vomiting  Post-op Vital Signs: stable  Complications: No apparent anesthesia complications

## 2011-08-27 LAB — BASIC METABOLIC PANEL
BUN: 5 mg/dL — ABNORMAL LOW (ref 6–23)
CO2: 30 mEq/L (ref 19–32)
Calcium: 8.5 mg/dL (ref 8.4–10.5)
Chloride: 102 mEq/L (ref 96–112)
Creatinine, Ser: 0.69 mg/dL (ref 0.50–1.10)
GFR calc Af Amer: >90 mL/min (ref >90)
GFR calc non Af Amer: 89 mL/min — ABNORMAL LOW (ref >90)
Glucose, Bld: 135 mg/dL — ABNORMAL HIGH (ref 70–99)
Potassium: 4.2 mEq/L (ref 3.5–5.1)
Sodium: 135 mEq/L (ref 135–145)

## 2011-08-27 LAB — DIFFERENTIAL
Basophils Absolute: 0 10*3/uL (ref 0.0–0.1)
Basophils Relative: 0 % (ref 0–1)
Eosinophils Absolute: 0.1 10*3/uL (ref 0.0–0.7)
Eosinophils Relative: 1 % (ref 0–5)
Lymphocytes Relative: 4 % — ABNORMAL LOW (ref 12–46)
Lymphs Abs: 0.3 10*3/uL — ABNORMAL LOW (ref 0.7–4.0)
Monocytes Absolute: 0.7 10*3/uL (ref 0.1–1.0)
Monocytes Relative: 10 % (ref 3–12)
Neutro Abs: 6.4 10*3/uL (ref 1.7–7.7)
Neutrophils Relative %: 85 % — ABNORMAL HIGH (ref 43–77)

## 2011-08-27 LAB — MAGNESIUM: Magnesium: 2 mg/dL (ref 1.5–2.5)

## 2011-08-27 LAB — CBC
HCT: 27.9 % — ABNORMAL LOW (ref 36.0–46.0)
Hemoglobin: 8.7 g/dL — ABNORMAL LOW (ref 12.0–15.0)
MCH: 30.9 pg (ref 26.0–34.0)
MCHC: 31.2 g/dL (ref 30.0–36.0)
MCV: 98.9 fL (ref 78.0–100.0)
Platelets: 180 10*3/uL (ref 150–400)
RBC: 2.82 MIL/uL — ABNORMAL LOW (ref 3.87–5.11)
RDW: 16.1 % — ABNORMAL HIGH (ref 11.5–15.5)
WBC: 7.5 10*3/uL (ref 4.0–10.5)

## 2011-08-27 LAB — PHOSPHORUS: Phosphorus: 2.6 mg/dL (ref 2.3–4.6)

## 2011-08-27 NOTE — Progress Notes (Signed)
  2 Days Post-Op  Subjective: Sore, pain controlled with meds. No nausea this AM but could not drink much yesterday  Objective: Vital signs in last 24 hours: Temp:  [97.8 F (36.6 C)-98.9 F (37.2 C)] 98.1 F (36.7 C) (11/17 0800) Pulse Rate:  [65-78] 69  (11/17 0800) Resp:  [12-20] 15  (11/17 0800) BP: (109-126)/(39-52) 109/47 mmHg (11/17 0800) SpO2:  [97 %-100 %] 98 % (11/17 0800)    Intake/Output from previous day: 11/16 0701 - 11/17 0700 In: 2212.3 [I.V.:2110.3; IV Piggyback:102] Out: 1350 [Urine:1250; Stool:100] Intake/Output this shift: Total I/O In: 320 [P.O.:120; I.V.:200] Out: 175 [Urine:175]  General appearance: alert and no distress Resp: wheezes anterior - bilateral mild, no increased WOB GI: normal findings: soft, non-tender, no stoma output Incision/Wound:Dressing dry, intact  Lab Results:   Basename 08/27/11 0320 08/26/11 0336  WBC 7.5 9.1  HGB 8.7* 9.2*  HCT 27.9* 27.8*  PLT 180 169   BMET  Basename 08/27/11 0320 08/26/11 0336  NA 135 137  K 4.2 4.0  CL 102 105  CO2 30 26  GLUCOSE 135* 126*  BUN 5* 8  CREATININE 0.69 0.65  CALCIUM 8.5 8.4   PT/INR No results found for this basename: LABPROT:2,INR:2 in the last 72 hours ABG No results found for this basename: PHART:2,PCO2:2,PO2:2,HCO3:2 in the last 72 hours  Studies/Results: No results found.  Anti-infectives: Anti-infectives     Start     Dose/Rate Route Frequency Ordered Stop   08/25/11 0545   ertapenem (INVANZ) 1 g in sodium chloride 0.9 % 50 mL IVPB        1 g 100 mL/hr over 30 Minutes Intravenous 60 min pre-op 08/25/11 0540 08/25/11 0810          Assessment/Plan: s/p Procedure(s): COLON RESECTION LAPAROSCOPIC Stable post op, still with ileus.  Cont pulm toilet, sips CL, increase activity   LOS: 2 days    [name] T 08/27/2011

## 2011-08-28 LAB — CBC
HCT: 25.9 % — ABNORMAL LOW (ref 36.0–46.0)
Hemoglobin: 8.4 g/dL — ABNORMAL LOW (ref 12.0–15.0)
MCH: 31.3 pg (ref 26.0–34.0)
MCHC: 32.4 g/dL (ref 30.0–36.0)
MCV: 96.6 fL (ref 78.0–100.0)
Platelets: 172 10*3/uL (ref 150–400)
RBC: 2.68 MIL/uL — ABNORMAL LOW (ref 3.87–5.11)
RDW: 16 % — ABNORMAL HIGH (ref 11.5–15.5)
WBC: 7 10*3/uL (ref 4.0–10.5)

## 2011-08-28 MED ORDER — KCL IN DEXTROSE-NACL 20-5-0.45 MEQ/L-%-% IV SOLN
INTRAVENOUS | Status: DC
  Administered 2011-08-28: 75 mL/h via INTRAVENOUS
  Administered 2011-08-29: 07:00:00 via INTRAVENOUS
  Administered 2011-08-30: 50 mL/h via INTRAVENOUS
  Filled 2011-08-28 (×5): qty 1000

## 2011-08-28 NOTE — Plan of Care (Signed)
Problem: Phase I Progression Outcomes Goal: Voiding-avoid urinary catheter unless indicated Outcome: Completed/Met Date Met:  08/28/11 Foley removed 11/17 and pt voiding

## 2011-08-28 NOTE — Progress Notes (Signed)
  3 Days Post-Op  Subjective: Up in chair. No C/O.  Occ mild nausea, drinking a little CL. No SOB. Good pain control.  Objective: Vital signs in last 24 hours: Temp:  [98 F (36.7 C)-98.9 F (37.2 C)] 98 F (36.7 C) (11/18 0800) Pulse Rate:  [66-79] 66  (11/18 0500) Resp:  [13-18] 13  (11/18 0500) BP: (99-123)/(42-59) 118/48 mmHg (11/18 0500) SpO2:  [88 %-100 %] 99 % (11/18 0500)    Intake/Output from previous day: 11/17 0701 - 11/18 0700 In: 3140 [P.O.:840; I.V.:2300] Out: 2300 [Urine:1925; Stool:375] Intake/Output this shift:    General appearance: alert and no distress Resp: wheezes bilaterally GI: normal findings: soft, non-tender and stoma pink, minimal output Incision/Wound:Dressed, clean and dry  Lab Results:   Basename 08/28/11 0330 08/27/11 0320  WBC 7.0 7.5  HGB 8.4* 8.7*  HCT 25.9* 27.9*  PLT 172 180   BMET  Basename 08/27/11 0320 08/26/11 0336  NA 135 137  K 4.2 4.0  CL 102 105  CO2 30 26  GLUCOSE 135* 126*  BUN 5* 8  CREATININE 0.69 0.65  CALCIUM 8.5 8.4   PT/INR No results found for this basename: LABPROT:2,INR:2 in the last 72 hours ABG No results found for this basename: PHART:2,PCO2:2,PO2:2,HCO3:2 in the last 72 hours  Studies/Results: No results found.  Anti-infectives: Anti-infectives     Start     Dose/Rate Route Frequency Ordered Stop   08/25/11 0545   ertapenem (INVANZ) 1 g in sodium chloride 0.9 % 50 mL IVPB        1 g 100 mL/hr over 30 Minutes Intravenous 60 min pre-op 08/25/11 0540 08/25/11 0810          Assessment/Plan: s/p Procedure(s): COLON RESECTION LAPAROSCOPIC Doing well post op, stable Ileus, cont CL only Blood loss anemia, check CBC AM Transfer to floor   LOS: 3 days    Kayzen Kendzierski T 08/28/2011

## 2011-08-28 NOTE — Progress Notes (Signed)
Patient transferring to room 1534 per MD order.  Report called to [place], Charity fundraiser.  Patient transferring by wheelchair.

## 2011-08-28 NOTE — Plan of Care (Signed)
Problem: Phase II Progression Outcomes Goal: Return of bowel function (flatus, BM) IF ABDOMINAL SURGERY:  Outcome: Progressing 11/17 bowel sounds present and colostomy with green drainage

## 2011-08-28 NOTE — Plan of Care (Signed)
Problem: Phase II Progression Outcomes Goal: Progressing with IS, TCDB Outcome: Completed/Met Date Met:  08/28/11 Is 1500

## 2011-08-28 NOTE — Plan of Care (Signed)
Problem: Phase I Progression Outcomes Goal: Pain controlled with appropriate interventions Outcome: Progressing Using PCA with tolerable pain relief

## 2011-08-29 DIAGNOSIS — J45909 Unspecified asthma, uncomplicated: Secondary | ICD-10-CM

## 2011-08-29 DIAGNOSIS — I10 Essential (primary) hypertension: Secondary | ICD-10-CM

## 2011-08-29 LAB — CBC
HCT: 28 % — ABNORMAL LOW (ref 36.0–46.0)
Hemoglobin: 8.7 g/dL — ABNORMAL LOW (ref 12.0–15.0)
MCH: 30.3 pg (ref 26.0–34.0)
MCHC: 31.1 g/dL (ref 30.0–36.0)
MCV: 97.6 fL (ref 78.0–100.0)
Platelets: 169 10*3/uL (ref 150–400)
RBC: 2.87 MIL/uL — ABNORMAL LOW (ref 3.87–5.11)
RDW: 15.7 % — ABNORMAL HIGH (ref 11.5–15.5)
WBC: 5.8 10*3/uL (ref 4.0–10.5)

## 2011-08-29 NOTE — Progress Notes (Signed)
4 Days Post-Op  Subjective: Pt with ostomy output, but minimal gas in bag.  Pain tolerable with PCA.  Has been ambulating.  Some nausea last night.  Does not like clears.    Objective: Vital signs in last 24 hours: Temp:  [97.9 F (36.6 C)-98.5 F (36.9 C)] 98.2 F (36.8 C) (11/19 0616) Pulse Rate:  [65-81] 72  (11/19 0616) Resp:  [14-20] 20  (11/19 0616) BP: (109-132)/(51-82) 119/82 mmHg (11/19 0616) SpO2:  [95 %-100 %] 99 % (11/19 0616) Last BM Date: 08/28/11  Intake/Output from previous day: 11/18 0701 - 11/19 0700 In: 2379.3 [P.O.:600; I.V.:1779.3] Out: 2450 [Urine:2100; Stool:350] Intake/Output this shift:    General appearance: alert, cooperative and no distress Head: Normocephalic, without obvious abnormality, atraumatic GI: Wound intact, OnQ empty, ostomy pink, bilious material in ostomy bag.  Lab Results:   Basename 08/29/11 0435 08/28/11 0330  WBC 5.8 7.0  HGB 8.7* 8.4*  HCT 28.0* 25.9*  PLT 169 172   BMET  Basename 08/27/11 0320  NA 135  K 4.2  CL 102  CO2 30  GLUCOSE 135*  BUN 5*  CREATININE 0.69  CALCIUM 8.5   PT/INR No results found for this basename: LABPROT:2,INR:2 in the last 72 hours ABG No results found for this basename: PHART:2,PCO2:2,PO2:2,HCO3:2 in the last 72 hours  Studies/Results: No results found.  Anti-infectives: Anti-infectives     Start     Dose/Rate Route Frequency Ordered Stop   08/25/11 0545   ertapenem (INVANZ) 1 g in sodium chloride 0.9 % 50 mL IVPB        1 g 100 mL/hr over 30 Minutes Intravenous 60 min pre-op 08/25/11 0540 08/25/11 0810          Assessment/Plan: s/p Procedure(s): COLON RESECTION LAPAROSCOPIC D/C OnQ, ambulate, wean O2, full liquids.    LOS: 4 days  Antihypertensives for HTN Spiriva, inhalers for Asthma  [name] 08/29/2011

## 2011-08-30 ENCOUNTER — Ambulatory Visit (HOSPITAL_COMMUNITY): Payer: Self-pay

## 2011-08-30 ENCOUNTER — Encounter (HOSPITAL_COMMUNITY): Payer: Medicare Other

## 2011-08-30 MED ORDER — SODIUM CHLORIDE 0.9 % IV SOLN
INTRAVENOUS | Status: DC
  Administered 2011-08-30: 12:00:00 via INTRAVENOUS

## 2011-08-30 MED ORDER — OXYCODONE-ACETAMINOPHEN 5-325 MG PO TABS
1.0000 | ORAL_TABLET | ORAL | Status: DC | PRN
  Administered 2011-08-31 – 2011-09-02 (×6): 1 via ORAL
  Filled 2011-08-30 (×6): qty 1

## 2011-08-30 NOTE — Progress Notes (Signed)
5 Days Post-Op  Subjective: Pt doing well post op.  Had issues with the ostomy appliance yesterday.  No nausea or vomiting with full liquids.    Objective: Vital signs in last 24 hours: Temp:  [98 F (36.7 C)-98.4 F (36.9 C)] 98.4 F (36.9 C) (11/20 0600) Pulse Rate:  [74-83] 83  (11/20 0600) Resp:  [18-20] 18  (11/20 0600) BP: (120-123)/(64-70) 120/70 mmHg (11/20 0600) SpO2:  [95 %-98 %] 96 % (11/20 0600) Last BM Date: 08/29/11 (colostomy)  Intake/Output from previous day: 11/19 0701 - 11/20 0700 In: 1130 [P.O.:600; I.V.:530] Out: 5200 [Urine:4400; Stool:800] Intake/Output this shift:    General appearance: alert, cooperative and no distress GI: Abd soft.  Ostomy with stool/gas.  Stool still watery.  Wicks removed from midline wound.    Lab Results:   Basename 08/29/11 0435 08/28/11 0330  WBC 5.8 7.0  HGB 8.7* 8.4*  HCT 28.0* 25.9*  PLT 169 172   BMET No results found for this basename: NA:2,K:2,CL:2,CO2:2,GLUCOSE:2,BUN:2,CREATININE:2,CALCIUM:2 in the last 72 hours  Pathology Complete report pending.    Assessment/Plan: s/p Procedure(s): COLON RESECTION LAPAROSCOPIC Advance diet D/C IVF and PCA, Hope for d/c tomorrow  LOS: 5 days    [facility] 08/30/2011

## 2011-08-30 NOTE — Progress Notes (Signed)
Colostomy appliance leaking, was changed twice. Wound/colostomy nurse notified via charge nurse leaving message. Patient needs more education. Does not want to touch the appliance at this time. Skin surrounding stoma is excoriated.

## 2011-08-31 LAB — BASIC METABOLIC PANEL
BUN: 6 mg/dL (ref 6–23)
CO2: 30 mEq/L (ref 19–32)
Calcium: 9.1 mg/dL (ref 8.4–10.5)
Chloride: 102 mEq/L (ref 96–112)
Creatinine, Ser: 0.69 mg/dL (ref 0.50–1.10)
GFR calc Af Amer: >90 mL/min (ref >90)
GFR calc non Af Amer: 89 mL/min — ABNORMAL LOW (ref >90)
Glucose, Bld: 97 mg/dL (ref 70–99)
Potassium: 3.6 mEq/L (ref 3.5–5.1)
Sodium: 139 mEq/L (ref 135–145)

## 2011-08-31 LAB — CBC
HCT: 28.4 % — ABNORMAL LOW (ref 36.0–46.0)
Hemoglobin: 9.2 g/dL — ABNORMAL LOW (ref 12.0–15.0)
MCH: 30.9 pg (ref 26.0–34.0)
MCHC: 32.4 g/dL (ref 30.0–36.0)
MCV: 95.3 fL (ref 78.0–100.0)
Platelets: 208 10*3/uL (ref 150–400)
RBC: 2.98 MIL/uL — ABNORMAL LOW (ref 3.87–5.11)
RDW: 15.6 % — ABNORMAL HIGH (ref 11.5–15.5)
WBC: 5.3 10*3/uL (ref 4.0–10.5)

## 2011-08-31 LAB — PHOSPHORUS: Phosphorus: 4.2 mg/dL (ref 2.3–4.6)

## 2011-08-31 LAB — MAGNESIUM: Magnesium: 1.9 mg/dL (ref 1.5–2.5)

## 2011-08-31 MED ORDER — DIBUCAINE 1 % RE OINT: 1.0000 "application " | TOPICAL_OINTMENT | Freq: Three times a day (TID) | RECTAL | Status: DC | PRN

## 2011-08-31 MED ORDER — OXYCODONE-ACETAMINOPHEN 5-325 MG PO TABS: 1.0000 | ORAL_TABLET | ORAL | Status: DC | PRN

## 2011-08-31 NOTE — Consult Note (Signed)
WOC ostomy consult  Patient seen per consult request.  INITIAL request received today, 08/31/11.  Patient was not marked by our WOC service pre-operatively. Stoma type/location: Right lower quadrant colostomy. Rotund abdomen. Stomal assessment/size: stoma budded, oval, measures 3/4 inch x 1.5inches. Stoma is located in a deep crease. When seated, patient's stoma is in a concave portion of her abdomen Peristomal assessment: denuded skin from 3-9 o'clock. Treatment options for stomal/peristomal skin: Dusted with pectin powder, sealed in with a no-sting sealant. Output thin brown stool Ostomy pouching: 1pc.convex pouch (cut to fit) with a barrier ring placed beneath it to increase convexity.  An ostomy belt is applied to reinforce seal at the 3 and 9 o'clock positions. Education provided: Patient and husband provided ostomy teaching booklet: Understanding Your Colostomy. Staff to reinforce concepts included within this book. Demonstrated reason for pouch failures with patient sitting up in hi-Fowler's position (concave geography of abdomen). Explained rationale for requiring a predictable pouching system prior to discharge.  Teaching included pouch closure, demonstration of pouch emptying and cleaning out bottom of pouch prior to closing pouch. I feel patient and spouse will need support of HHRN post acute care.  If you agree, please order. I will follow, but I will not be here tomorrow.  I will see on Friday if she is still an inpatient. Thanks, Ladona Mow, MSN, RN, [facility], CWOCN [phone])

## 2011-08-31 NOTE — Progress Notes (Signed)
6 Days Post-Op  Subjective: Pt still having significant difficulty with ostomy coming off.  Tolerating regular diet.  Pain improving.  Ambulating  Objective: Vital signs in last 24 hours: Temp:  [97.8 F (36.6 C)-98.3 F (36.8 C)] 98.1 F (36.7 C) (11/21 0600) Pulse Rate:  [78-81] 78  (11/21 0600) Resp:  [18-20] 18  (11/21 0600) BP: (100-145)/(62-76) 100/62 mmHg (11/21 0600) SpO2:  [96 %-97 %] 97 % (11/21 0600) Last BM Date: 08/30/11  Intake/Output from previous day: 11/20 0701 - 11/21 0700 In: 1080 [P.O.:1080] Out: 4250 [Urine:3550; Stool:700] Intake/Output this shift:    General appearance: alert and no distress GI: Wound OK, ostomy pink, bilious output Extremities: extremities normal, atraumatic, no cyanosis or edema  Lab Results:   Basename 08/31/11 0400 08/29/11 0435  WBC 5.3 5.8  HGB 9.2* 8.7*  HCT 28.4* 28.0*  PLT 208 169   BMET  Basename 08/31/11 0400  NA 139  K 3.6  CL 102  CO2 30  GLUCOSE 97  BUN 6  CREATININE 0.69  CALCIUM 9.1   PT/INR No results found for this basename: LABPROT:2,INR:2 in the last 72 hours ABG No results found for this basename: PHART:2,PCO2:2,PO2:2,HCO3:2 in the last 72 hours  Studies/Results: No results found.  Anti-infectives: Anti-infectives     Start     Dose/Rate Route Frequency Ordered Stop   08/25/11 0545   ertapenem (INVANZ) 1 g in sodium chloride 0.9 % 50 mL IVPB        1 g 100 mL/hr over 30 Minutes Intravenous 60 min pre-op 08/25/11 0540 08/25/11 0810          Assessment/Plan: s/p Procedure(s): COLON RESECTION LAPAROSCOPIC Plan for discharge tomorrow Work with ostomy nurse on stoma appliance.  Acute blood loss anemia: Stable.   LOS: 6 days    [employer] 08/31/2011

## 2011-08-31 NOTE — Discharge Summary (Signed)
Physician Discharge Summary  Patient ID: [name] MRN: [id number] DOB/AGE: Mar 20, 1946 65 y.o.  Admit date: 08/25/2011 Discharge date: 09/03/2011  Admission Diagnoses:  Discharge Diagnoses:   Rectal cancer ypT3ypN0(0/7 LN) cM0 COPD (Asthma) GERD HTN Anemia    Discharged Condition: good  Hospital Course: Pt admitted to hospital following Lap LAR with diverting ileostomy.  She was able to void with foley removed.  She was able to ambulate independently.  Her diet was advanced as her bowel function improved.  She was transitioned from IV to po pain meds.  She has been having difficulty with her ostomy appliance.  Working with the ostomy nurse to correct these issues, she and her husband made improvements with a convex belted appliance.  Therefore, it was safe to D/C  Consults: none  Significant Diagnostic Studies: labs: HCT 28.4  Treatments: surgery: lap LAR with diverting ostomy  Discharge Exam: Blood pressure 100/62, pulse 78, temperature 98.1 F (36.7 C), temperature source Oral, resp. rate 18, height 5\' 8"  (1.727 m), weight 239 lb 3.2 oz (108.5 kg), SpO2 97.00%. General appearance: alert, cooperative and no distress Resp: no respiratory distress Chest wall: no tenderness GI: Ostomy pink, bilious output.  No erythema of wound.  On Q out. Extremities: extremities normal, atraumatic, no cyanosis or edema  Disposition: Home or Self Care  Discharge Orders    Future Appointments: Provider: Department: Dept Phone: Center:   02/10/2012 10:00 AM Jonna Coup, MD [facility] [phone] None     Current Discharge Medication List    START taking these medications   Details  dibucaine (NUPERCAINAL) 1 % OINT Place 1 application rectally 3 (three) times daily as needed. Qty: 28.4 g, Refills: 1    oxyCODONE-acetaminophen (PERCOCET) 5-325 MG per tablet Take 1-2 tablets by mouth every 4 (four) hours as needed. Qty: 60 tablet, Refills: 0      CONTINUE these  medications which have NOT CHANGED   Details  ADVAIR DISKUS 100-50 MCG/DOSE AEPB INHALE 1 DOSE BY MOUTH TWICE A DAY Qty: 60 each, Refills: 5    albuterol (PROVENTIL) (2.5 MG/3ML) 0.083% nebulizer solution Take 2.5 mg by nebulization as needed. SHORTNESS OF BREATH    aspirin 81 MG tablet Take 81 mg by mouth at bedtime.     Calcium Carbonate-Vitamin D (CALCIUM 600 + D PO) Take 600 mg by mouth 2 (two) times daily.     diltiazem (CARDIZEM CD) 120 MG 24 hr capsule Take 120 mg by mouth at bedtime.     losartan (COZAAR) 50 MG tablet Take 50 mg by mouth at bedtime.     !! lovastatin (MEVACOR) 40 MG tablet     !! lovastatin (MEVACOR) 40 MG tablet take 1 tablet by mouth once daily Qty: 30 tablet, Refills: 1    mometasone (NASONEX) 50 MCG/ACT nasal spray Place 2 sprays into the nose as needed. ALLERGIES    montelukast (SINGULAIR) 10 MG tablet Take 10 mg by mouth at bedtime.     pantoprazole (PROTONIX) 40 MG tablet Take 40 mg by mouth daily.     SPIRIVA HANDIHALER 18 MCG inhalation capsule PLACE 1 CAPSULE INTO INHALER AND INHALE DAILY Qty: 30 capsule, Refills: 1    albuterol (PROVENTIL,VENTOLIN) 90 MCG/ACT inhaler Inhale 2 puffs into the lungs as needed. SHORTNESS OF BREATH    prochlorperazine (COMPAZINE) 10 MG tablet Take 10 mg by mouth Ad lib. NAUSEA     !! - Potential duplicate medications found. Please discuss with provider.    STOP taking these  medications     polyethylene glycol (MIRALAX / GLYCOLAX) packet          Signed: BYERLY,FAERA 08/31/2011, 9:17 AM

## 2011-09-01 ENCOUNTER — Encounter (HOSPITAL_COMMUNITY): Payer: Self-pay | Admitting: Surgery

## 2011-09-01 ENCOUNTER — Ambulatory Visit (HOSPITAL_COMMUNITY): Payer: Self-pay

## 2011-09-01 ENCOUNTER — Encounter (HOSPITAL_COMMUNITY): Payer: Medicare Other

## 2011-09-01 DIAGNOSIS — Z432 Encounter for attention to ileostomy: Secondary | ICD-10-CM

## 2011-09-01 LAB — CREATININE, SERUM
Creatinine, Ser: 0.71 mg/dL (ref 0.50–1.10)
GFR calc Af Amer: >90 mL/min (ref >90)
GFR calc non Af Amer: 88 mL/min — ABNORMAL LOW (ref >90)

## 2011-09-01 MED ORDER — HYDROMORPHONE HCL PF 1 MG/ML IJ SOLN: 0.5000 mg | INTRAMUSCULAR | Status: DC | PRN

## 2011-09-01 MED ORDER — LOPERAMIDE HCL 2 MG PO CAPS: 2.0000 mg | ORAL_CAPSULE | Freq: Three times a day (TID) | ORAL | Status: DC | PRN

## 2011-09-01 MED ORDER — HYDROMORPHONE BOLUS VIA INFUSION: 0.5000 mg | INTRAVENOUS | Status: DC | PRN

## 2011-09-01 MED ORDER — LIP MEDEX EX OINT
1.0000 "application " | TOPICAL_OINTMENT | Freq: Two times a day (BID) | CUTANEOUS | Status: DC
  Filled 2011-09-01: qty 7

## 2011-09-01 MED ORDER — BISMUTH SUBSALICYLATE 262 MG/15ML PO SUSP
30.0000 mL | Freq: Two times a day (BID) | ORAL | Status: DC
  Filled 2011-09-01: qty 236

## 2011-09-01 MED ORDER — FLORA-Q PO CAPS
1.0000 | ORAL_CAPSULE | Freq: Every day | ORAL | Status: DC
  Administered 2011-09-01 – 2011-09-03 (×3): 1 via ORAL
  Filled 2011-09-01 (×3): qty 1

## 2011-09-01 NOTE — Progress Notes (Signed)
PCP: Illene Regulus, MD, MD  Outpatient Care Team: Patient Care Team: Duke Salvia, MD as PCP - General Rob Bunting, MD as Consulting Physician (Gastroenterology) Jonna Coup, MD as Consulting Physician (Radiation Oncology) Vicente Serene I. Odogwu, MD as Consulting Physician (Hematology and Oncology) Governor Rooks, MD as Consulting Physician (Cardiology) [name], MD as Consulting Physician (Pulmonary Disease)  Inpatient Treatment Team: Treatment Team: Attending Provider: [name], MD; Respiratory Therapist: Higinio Plan, RRT; Consulting Physician: Jamie Brookes. Odogwu, MD; Respiratory Therapist: Leonides Schanz, RRT; Registered Nurse: Bayard Hugger, RN; Registered Nurse: Ivan Croft, RN; Registered Nurse: Clent Ridges, RN; Registered Nurse: Romeo Rabon, RN; Respiratory Therapist: Jaynie Crumble, RRT; Registered Nurse: Bethann Goo, RN; Respiratory Therapist: Tyna Jaksch, RRT; Technician: Ula Lingo, NT   LOS: 7 days   7 Days Post-Op  Procedure(s): COLON RESECTION LAPAROSCOPIC  Subjective:  Feeling better Still having leaking problems w the ileostomy - belt helps Walking better PO intake improving Pain getting better controlled  Objective:  Vital signs:  Temp:  [97.4 F (36.3 C)-98.6 F (37 C)] 97.4 F (36.3 C) (11/22 0600) Pulse Rate:  [74-78] 74  (11/22 0600) Resp:  [18] 18  (11/22 0600) BP: (119-121)/(71-72) 121/72 mmHg (11/22 0600) SpO2:  [94 %-97 %] 94 % (11/22 0851) Last BM Date: 08/30/11  Intake/Output    from previous day: 11/21 0701 - 11/22 0700 In: 960 [P.O.:960] Out: 2925 [Urine:2300; Stool:625]  this shift: Total I/O In: 120 [P.O.:120] Out: 350 [Urine:300; Stool:50]  Flatus: yes BM: liq stool in ileostomy  Physical Exam:  General: Pt awake/alert/oriented x4 in no acute distress Eyes: PERRL, normal EOM.  Sclera clear.  No icterus Neuro: CN II-XII intact  w/o focal sensory/motor deficits. Lymph: No head/neck/groin lymphadenopathy Psych:  No delerium/psychosis/paranoia HENT: Normocephalic, Mucus membranes moist.  No thrush Neck: Supple, No tracheal deviation Chest: Clear lungs.  No chest wall pain w good excursion CV:  Pulses intact.  Regular rhythm Abdomen: Soft, Nontender/Nondistended.  No incarcerated hernias.  Obese w RUQ ileostomy pouch in place Ext:  SCDs BLE.  No mjr edema.  No cyanosis Skin: No petechiae / purpurae  Results:   Labs: Results for orders placed during the hospital encounter of 08/25/11 (from the past 48 hour(s))  CBC     Status: Abnormal   Collection Time   08/31/11  4:00 AM      Component Value Range Comment   WBC 5.3  4.0 - 10.5 (K/uL)    RBC 2.98 (*) 3.87 - 5.11 (MIL/uL)    Hemoglobin 9.2 (*) 12.0 - 15.0 (g/dL)    HCT 16.1 (*) 09.6 - 46.0 (%)    MCV 95.3  78.0 - 100.0 (fL)    MCH 30.9  26.0 - 34.0 (pg)    MCHC 32.4  30.0 - 36.0 (g/dL)    RDW 04.5 (*) 40.9 - 15.5 (%)    Platelets 208  150 - 400 (K/uL)   BASIC METABOLIC PANEL     Status: Abnormal   Collection Time   08/31/11  4:00 AM      Component Value Range Comment   Sodium 139  135 - 145 (mEq/L)    Potassium 3.6  3.5 - 5.1 (mEq/L)    Chloride 102  96 - 112 (mEq/L)    CO2 30  19 - 32 (mEq/L)    Glucose, Bld 97  70 - 99 (mg/dL)    BUN 6  6 - 23 (mg/dL)  Creatinine, Ser 0.69  0.50 - 1.10 (mg/dL)    Calcium 9.1  8.4 - 10.5 (mg/dL)    GFR calc non Af Amer 89 (*) >90 (mL/min)    GFR calc Af Amer >90  >90 (mL/min)   MAGNESIUM     Status: Normal   Collection Time   08/31/11  4:00 AM      Component Value Range Comment   Magnesium 1.9  1.5 - 2.5 (mg/dL)   PHOSPHORUS     Status: Normal   Collection Time   08/31/11  4:00 AM      Component Value Range Comment   Phosphorus 4.2  2.3 - 4.6 (mg/dL)   CREATININE, SERUM     Status: Abnormal   Collection Time   09/01/11  4:45 AM      Component Value Range Comment   Creatinine, Ser 0.71  0.50 - 1.10  (mg/dL)    GFR calc non Af Amer 88 (*) >90 (mL/min)    GFR calc Af Amer >90  >90 (mL/min)     Imaging / Studies: @[id number] @  Antibiotics: Anti-infectives     Start     Dose/Rate Route Frequency Ordered Stop   08/25/11 0545   ertapenem (INVANZ) 1 g in sodium chloride 0.9 % 50 mL IVPB        1 g 100 mL/hr over 30 Minutes Intravenous 60 min pre-op 08/25/11 0540 08/25/11 0810          Medications / Allergies: per chart  Assessment / Plan: [name]  65 y.o. female  Procedure(s): COLON RESECTION LAPAROSCOPIC  Problem List:  Principal Problem:  *Rectal cancer, dx with anemia, 10 cm from anal verge uT3N0  -adv diet Active Problems:  Morbid obesity  COPD  -stable   GERD  -PPI  Iron deficiency anemia  -follow s/p LAR  Ileostomy care  -increase PO intake to avoid dehydration  -WOCN & RN care to help troubleshoot leaking  -set up Home Health -VTE prophylaxis- SCDs, etc -mobilize as tolerated to help recovery -probable D/C tomorrow if ileostomy management setup & better seal & better PO intake  Lorenso Courier, [name], F.A.C.S. Gastrointestinal and Minimally Invasive Surgery [facility] [address] [place], [place] [address] [phone] Main / Paging [phone] Voice Mail   09/01/2011

## 2011-09-02 NOTE — Progress Notes (Signed)
Hopefully home tonight/tomorrow once Home Health arranged & WOCN sees at least one more time to help with patient education of ileostomy care, etc

## 2011-09-02 NOTE — Consult Note (Signed)
WOC ostomy consult  Pouch applied Wednesday (11/21) still intact over right lower quadrant ileostomy stoma. Wearing belt. Additional education provided regarding care of stoma. Bathing routine discussed and sample of Dial soap provided since patient is used to using a cream-based soap-Dove. Patient is not able to independently change her pouch; husband or HHRN will have to assist.  [facility] services are being established and goal is to have RN visit on Sunday, 11/25.  Patient is independent in pouch emptying.   Patient and husband instructed on how to obtain supplies post discharge from [facility] services.  [employer] catalog provided. Following discussion with Zola Button, PA, two gauze wicks (1/4 inch plain gauze packing strip) removed from distal portion of wound. Serosanguinous drainage noted.  Gauze dressing (dry) placed over this wound.  Orders written for monitoring of this site, in addition to changing the dressings daily. 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  Antifungal powder dusted in this area. Supplies at bedside (6 pouches, 12 barrier rings and a spare belt). Patient and family have my card if questions arise at home. Patient ready to discharge when HHRN's visit on Sunday is confirmed. Ladona Mow, MSN, RN, GNP, [employer]

## 2011-09-02 NOTE — Progress Notes (Signed)
8 Days Post-Op  Subjective: She looks great.  The current ostomy bag has been in place since Wed, without a leak or problem. She can empty it, but can't change it on her own yet.  She has a wick at base of abdominal incision, looks like 1/4 in iodoform. No dressing over this.  Objective: Vital signs in last 24 hours: Temp:  [97.8 F (36.6 C)-98.4 F (36.9 C)] 98 F (36.7 C) (11/23 0500) Pulse Rate:  [72-79] 72  (11/23 0500) Resp:  [16-18] 18  (11/23 0500) BP: (109-118)/(60-67) 113/60 mmHg (11/23 0500) SpO2:  [95 %-98 %] 95 % (11/23 0500) Last BM Date: 08/30/11  Intake/Output from previous day: 11/22 0701 - 11/23 0700 In: 280 [P.O.:280] Out: 1500 [Urine:1000; Stool:500] Intake/Output this shift: Total I/O In: -  Out: 400 [Urine:300; Stool:100]  General appearance: alert, cooperative and no distress Resp: few rales at base. GI: soft, non-tender; bowel sounds normal; no masses,  no organomegaly and Soft, the colostomy with belt is working well.  Her incision is somewhat burried in panus.  There is a wick still present.  Will find out how we need to follow up with that.  Lab Results:   Basename 08/31/11 0400  WBC 5.3  HGB 9.2*  HCT 28.4*  PLT 208    BMET  Basename 09/01/11 0445 08/31/11 0400  NA -- 139  K -- 3.6  CL -- 102  CO2 -- 30  GLUCOSE -- 97  BUN -- 6  CREATININE 0.71 0.69  CALCIUM -- 9.1   PT/INR No results found for this basename: LABPROT:2,INR:2 in the last 72 hours   Studies/Results: No results found.  Anti-infectives: Anti-infectives     Start     Dose/Rate Route Frequency Ordered Stop   08/25/11 0545   ertapenem (INVANZ) 1 g in sodium chloride 0.9 % 50 mL IVPB        1 g 100 mL/hr over 30 Minutes Intravenous 60 min pre-op 08/25/11 0540 08/25/11 0810         Current Facility-Administered Medications  Medication Dose Route Frequency Provider Last Rate Last Dose  . 0.9 %  sodium chloride infusion   Intravenous Continuous Ernestene Mention, MD  20 mL/hr at 08/30/11 1222    . albuterol (PROVENTIL HFA;VENTOLIN HFA) 108 (90 BASE) MCG/ACT inhaler   Inhalation PRN [name], MD      . albuterol (PROVENTIL) (5 MG/ML) 0.5% nebulizer solution 2.5 mg  2.5 mg Nebulization Q4H PRN [name], MD      . alvimopan (ENTEREG) capsule 12 mg  12 mg Oral BID [name], MD   12 mg at 09/01/11 2211  . bismuth subsalicylate (PEPTO BISMOL) 262 MG/15ML suspension 30 mL  30 mL Oral BID Ardeth Sportsman, MD      . dibucaine (NUPERCAINAL) 1 % rectal ointment   Rectal PRN [name], MD      . diltiazem (CARDIZEM CD) 24 hr capsule 120 mg  120 mg Oral QHS [name], MD   120 mg at 09/01/11 2210  . diphenhydrAMINE (BENADRYL) injection 12.5 mg  12.5 mg Intravenous Q6H PRN [name], MD       Or  . diphenhydrAMINE (BENADRYL) 12.5 MG/5ML elixir 12.5 mg  12.5 mg Oral Q6H PRN [name], MD      . enoxaparin (LOVENOX) injection 40 mg  40 mg Subcutaneous Q24H [name], MD   40 mg at 09/01/11 1810  . Flora-Q (FLORA-Q) Capsule 1 capsule  1 capsule Oral  Daily Ardeth Sportsman, MD   1 capsule at 09/01/11 1813  . fluticasone (FLONASE) 50 MCG/ACT nasal spray 1 spray  1 spray Each Nare Daily [name], MD   1 spray at 08/31/11 1128  . Fluticasone-Salmeterol (ADVAIR) 100-50 MCG/DOSE inhaler 1 puff  1 puff Inhalation BID [name], MD   1 puff at 09/01/11 1948  . HYDROmorphone (DILAUDID) injection 0.5-2 mg  0.5-2 mg Intravenous Q30 min PRN Berkley Harvey, PHARMD      . lip balm (CARMEX) ointment 1 application  1 application Topical BID Ardeth Sportsman, MD      . loperamide (IMODIUM) capsule 2-4 mg  2-4 mg Oral Q8H PRN Ardeth Sportsman, MD      . montelukast (SINGULAIR) tablet 10 mg  10 mg Oral QHS [name], MD   10 mg at 09/01/11 2210  . naloxone [facility]) injection 0.4 mg  0.4 mg Intravenous PRN [name], MD       And  . sodium chloride 0.9 % injection 9 mL  9 mL Intravenous PRN [name], MD      . ondansetron (ZOFRAN) tablet 4 mg  4  mg Oral Q6H PRN [name], MD       Or  . ondansetron (ZOFRAN) injection 4 mg  4 mg Intravenous Q6H PRN [name], MD   4 mg at 08/26/11 1352  . oxyCODONE-acetaminophen (PERCOCET) 5-325 MG per tablet 1-2 tablet  1-2 tablet Oral Q4H PRN [name], MD   1 tablet at 09/01/11 2312  . promethazine (PHENERGAN) injection 6.25 mg  6.25 mg Intravenous Q6H PRN Sherrie George, PA   6.25 mg at 08/27/11 2223  . tiotropium (SPIRIVA) inhalation capsule 18 mcg  18 mcg Inhalation Daily [name], MD   18 mcg at 09/01/11 0849  . DISCONTD: HYDROmorphone (DILAUDID) bolus via infusion 0.5-2 mg  0.5-2 mg Intravenous Q30 min PRN Ardeth Sportsman, MD        Assessment/Plan POD8  S/p LAR.  Working on [employer] care. Patient Active Problem List  Diagnoses  . OTHER CANDIDIASIS OF OTHER SPECIFIED SITES  . HYPERLIPIDEMIA  . Morbid obesity  . MERALGIA PARESTHETICA  . UNSPECIFIED ESSENTIAL HYPERTENSION  . ACUTE BRONCHITIS  . ALLERGIC RHINITIS  . VIRAL PNEUMONIA  . PNEUMONIA, LEFT  . CHRONIC OBSTRUCTIVE PULMONARY DISEASE, ACUTE EXACERBATION  . ASTHMA  . COPD  . Other Diseases of Lung, not Elsewhere Classified  . GERD  . CONTACT DERMATITIS  . PRESSURE ULCER UNSPECIFIED SITE  . BACK PAIN  . CARDIAC MURMUR, AORTIC  . SHORTNESS OF BREATH (SOB)  . COUGH  . PNEUMONIA, HX OF  . Iron deficiency anemia  . Rectal cancer, dx with anemia, 10 cm from anal verge uT3N0  . Ileostomy care  Plan: Will let Ostomy training be continued today and consider discharge latter today, or tomorrow, when she is ready.  Be sure Home Health is available to help.   LOS: 8 days    Chrles Selley 09/02/2011

## 2011-09-03 MED ORDER — OXYCODONE HCL 5 MG PO TABS: 5.0000 mg | ORAL_TABLET | ORAL | Status: AC | PRN

## 2011-09-03 MED ORDER — LOPERAMIDE HCL 2 MG PO CAPS: 2.0000 mg | ORAL_CAPSULE | Freq: Three times a day (TID) | ORAL | Status: AC | PRN

## 2011-09-03 NOTE — Progress Notes (Signed)
CM consulted pt concerning HH needs. [facility] to provide [facility] services. [facility] notified of pt discharge today. Resume of care is scheduled for 11/25.

## 2011-09-05 ENCOUNTER — Telehealth: Payer: Self-pay | Admitting: *Deleted

## 2011-09-05 ENCOUNTER — Telehealth (INDEPENDENT_AMBULATORY_CARE_PROVIDER_SITE_OTHER): Payer: Self-pay | Admitting: General Surgery

## 2011-09-05 NOTE — Telephone Encounter (Signed)
FYI: [name] w/[facility] calling about drug contraindication: stating that Pt was recently [facility] after having partial colectomy and after review of meds she found a Level I drug contraindication w/Diltiazem & Lovastatin w/inhibiting metabolism & rabdomiolisis and would like your instruction on these medications.

## 2011-09-05 NOTE — Telephone Encounter (Signed)
On lovastatin 40 for more than a year, along with diltiazem, with no problems. May continue present meds.

## 2011-09-05 NOTE — Telephone Encounter (Signed)
LMOM to inform caller.

## 2011-09-06 ENCOUNTER — Ambulatory Visit (HOSPITAL_COMMUNITY): Payer: Self-pay

## 2011-09-06 ENCOUNTER — Encounter (HOSPITAL_COMMUNITY): Payer: Medicare Other

## 2011-09-07 ENCOUNTER — Other Ambulatory Visit: Payer: Self-pay | Admitting: *Deleted

## 2011-09-07 ENCOUNTER — Other Ambulatory Visit: Payer: Self-pay | Admitting: Hematology and Oncology

## 2011-09-07 DIAGNOSIS — C20 Malignant neoplasm of rectum: Secondary | ICD-10-CM

## 2011-09-08 ENCOUNTER — Encounter (HOSPITAL_COMMUNITY): Payer: Medicare Other

## 2011-09-08 ENCOUNTER — Telehealth: Payer: Self-pay | Admitting: Hematology and Oncology

## 2011-09-08 ENCOUNTER — Ambulatory Visit (HOSPITAL_COMMUNITY): Payer: Self-pay

## 2011-09-08 NOTE — Telephone Encounter (Signed)
S/w pt today re appt for 12/12 @ 2:15 pm.

## 2011-09-09 ENCOUNTER — Encounter (HOSPITAL_COMMUNITY): Payer: Self-pay | Admitting: General Surgery

## 2011-09-13 ENCOUNTER — Encounter (HOSPITAL_COMMUNITY): Payer: Medicare Other

## 2011-09-13 ENCOUNTER — Ambulatory Visit (HOSPITAL_COMMUNITY): Payer: Self-pay

## 2011-09-15 ENCOUNTER — Encounter (HOSPITAL_COMMUNITY): Payer: Medicare Other

## 2011-09-15 ENCOUNTER — Ambulatory Visit (HOSPITAL_COMMUNITY): Payer: Self-pay

## 2011-09-19 ENCOUNTER — Other Ambulatory Visit: Payer: Self-pay | Admitting: Internal Medicine

## 2011-09-20 ENCOUNTER — Ambulatory Visit (HOSPITAL_COMMUNITY): Payer: Self-pay

## 2011-09-20 ENCOUNTER — Encounter: Payer: Self-pay | Admitting: *Deleted

## 2011-09-20 ENCOUNTER — Encounter (HOSPITAL_COMMUNITY): Payer: Medicare Other

## 2011-09-20 ENCOUNTER — Encounter (INDEPENDENT_AMBULATORY_CARE_PROVIDER_SITE_OTHER): Payer: Self-pay | Admitting: Surgery

## 2011-09-20 ENCOUNTER — Ambulatory Visit (INDEPENDENT_AMBULATORY_CARE_PROVIDER_SITE_OTHER): Payer: Medicare Other | Admitting: Surgery

## 2011-09-20 VITALS — BP 148/72 | HR 98 | Temp 97.8°F | Resp 20 | Ht 68.0 in | Wt 217.4 lb

## 2011-09-20 DIAGNOSIS — Z9889 Other specified postprocedural states: Secondary | ICD-10-CM

## 2011-09-20 NOTE — Patient Instructions (Signed)
Home health for wound care.  Keep Thursday appointment

## 2011-09-20 NOTE — Progress Notes (Signed)
Patient ID: [name], female   DOB: November 10, 1945, 65 y.o.   MRN: [id number] Pt returns due to drainage from incision for last 2-3 days.  No fever.  Drainage is yellow.  No nausea or vomiting.  Exam:  Inferior aspect of incision draining small amount of clear yellow drainage.  No erythema.  Fascia intact with Q tip.   Wound drainage Pack with iodoform packing.  Keep follow up with Dr Donell Beers No evidence of infection

## 2011-09-21 ENCOUNTER — Other Ambulatory Visit (HOSPITAL_BASED_OUTPATIENT_CLINIC_OR_DEPARTMENT_OTHER): Payer: Medicare Other | Admitting: Lab

## 2011-09-21 ENCOUNTER — Ambulatory Visit (HOSPITAL_BASED_OUTPATIENT_CLINIC_OR_DEPARTMENT_OTHER): Payer: Medicare Other | Admitting: Hematology and Oncology

## 2011-09-21 ENCOUNTER — Other Ambulatory Visit: Payer: Self-pay | Admitting: Hematology and Oncology

## 2011-09-21 ENCOUNTER — Telehealth (INDEPENDENT_AMBULATORY_CARE_PROVIDER_SITE_OTHER): Payer: Self-pay

## 2011-09-21 VITALS — BP 89/55 | HR 81 | Temp 97.0°F | Ht 68.0 in | Wt 216.3 lb

## 2011-09-21 DIAGNOSIS — R5381 Other malaise: Secondary | ICD-10-CM

## 2011-09-21 DIAGNOSIS — C20 Malignant neoplasm of rectum: Secondary | ICD-10-CM

## 2011-09-21 DIAGNOSIS — D539 Nutritional anemia, unspecified: Secondary | ICD-10-CM

## 2011-09-21 DIAGNOSIS — R5383 Other fatigue: Secondary | ICD-10-CM

## 2011-09-21 LAB — CBC WITH DIFFERENTIAL/PLATELET
BASO%: 0.7 % (ref 0.0–2.0)
Basophils Absolute: 0 10*3/uL (ref 0.0–0.1)
EOS%: 4.3 % (ref 0.0–7.0)
Eosinophils Absolute: 0.2 10*3/uL (ref 0.0–0.5)
HCT: 32.2 % — ABNORMAL LOW (ref 34.8–46.6)
HGB: 10.9 g/dL — ABNORMAL LOW (ref 11.6–15.9)
LYMPH%: 11.6 % — ABNORMAL LOW (ref 14.0–49.7)
MCH: 30.6 pg (ref 25.1–34.0)
MCHC: 33.7 g/dL (ref 31.5–36.0)
MCV: 90.8 fL (ref 79.5–101.0)
MONO#: 0.5 10*3/uL (ref 0.1–0.9)
MONO%: 11.7 % (ref 0.0–14.0)
NEUT#: 3.4 10*3/uL (ref 1.5–6.5)
NEUT%: 71.7 % (ref 38.4–76.8)
Platelets: 200 10*3/uL (ref 145–400)
RBC: 3.55 10*6/uL — ABNORMAL LOW (ref 3.70–5.45)
RDW: 16.2 % — ABNORMAL HIGH (ref 11.2–14.5)
WBC: 4.7 10*3/uL (ref 3.9–10.3)
lymph#: 0.5 10*3/uL — ABNORMAL LOW (ref 0.9–3.3)

## 2011-09-21 LAB — COMPREHENSIVE METABOLIC PANEL
ALT: <8 U/L (ref 0–35)
AST: 11 U/L (ref 0–37)
Albumin: 3.6 g/dL (ref 3.5–5.2)
Alkaline Phosphatase: 74 U/L (ref 39–117)
BUN: 13 mg/dL (ref 6–23)
CO2: 27 mEq/L (ref 19–32)
Calcium: 8.9 mg/dL (ref 8.4–10.5)
Chloride: 102 mEq/L (ref 96–112)
Creatinine, Ser: 0.86 mg/dL (ref 0.50–1.10)
Glucose, Bld: 97 mg/dL (ref 70–99)
Potassium: 4.2 mEq/L (ref 3.5–5.3)
Sodium: 141 mEq/L (ref 135–145)
Total Bilirubin: 0.5 mg/dL (ref 0.3–1.2)
Total Protein: 6.4 g/dL (ref 6.0–8.3)

## 2011-09-21 LAB — CEA: CEA: <0.5 ng/mL (ref 0.0–5.0)

## 2011-09-21 NOTE — Progress Notes (Signed)
This office note has been dictated.

## 2011-09-21 NOTE — Progress Notes (Signed)
CC:   Diane Lint, MD Diane Fee, MD Diane Gess. Norins, MD Diane Shan, MD [name], [name] [name], [name], [name]  IDENTIFYING STATEMENT:  The patient is a 65 year old woman with rectal cancer who presents for followup.  INTERIM HISTORY:  [name] continues to recover from her recent surgery.  She underwent a laparoscopic partial colectomy for rectal cancer, uT3 N0 M0, on 08/2011.  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  Tumor was seen to invade through the muscularis propria into the pericolonic fatty tissue.  There was no evidence of lymphovascular for vascular or perineural invasion and none of the 7 lymph nodes sampled had evidence of malignancy.  The patient feels fatigued and notes that she can barely perform most of her activities daily living.  She is eating well.  Her colostomy is functioning well.  She has very minimal pain.  She is afebrile.  MEDICATIONS:  Reviewed and updated.  ALLERGIES:  Sulfa, codeine, prednisone.  FAMILY HISTORY/PAST MEDICAL HISTORY/SOCIAL HISTORY:  Unchanged.  REVIEW OF SYSTEMS:  As above.  Rest of review of systems negative.  PHYSICAL EXAM:  General:  Alert and oriented x3.  Vitals:  Pulse 81, blood pressure 89/55 without orthostasis, temperature 97, respirations 20, weight 216 pounds.  HEENT:  Head is atraumatic, normocephalic. Sclerae anicteric.  Mouth is moist.  Chest:  Clear.  Abdomen:  Notes a colostomy with liquid stool.  Bowel sounds present.  Extremities:  No calf tenderness.  Lymph Nodes:  No edema.  CNS:  Nonfocal.  LAB DATA:  09/21/2011 white cell count 4.7, hemoglobin 10.9, hematocrit 32.2, platelets 200.  CMET and CEA pending.  IMPRESSION AND PLAN:  [name] is a 65 year old woman with uT3 N1, low-lying invasive rectal adenocarcinoma diagnosed in August 2012.  She is status post concurrent Xeloda with radiation therapy from 05/30/2011 through 07/07/2011.  She is  status post laparoscopic partial colectomy on 08/25/2011 revealing a T3 N0 (0/7 lymph nodes), well-differentiated mucinous adenocarcinoma of the rectum.  She is still recovering from surgery.  However, we have potentially planned adjuvant chemotherapy in the form of Xeloda dose 1500 mg p.o. b.i.d. for 14 days followed by a week's rest to potentially begin on October 21, 2011.  I will add anemia panel to patient's lab work.  If her ferritin levels are low, we will invite her to receive Feraheme sometime next week.  We spent some time discussing the logistics and side effects of Xeloda.  She had a number of questions which were answered.  I spent more than half the time discussing and coordinating care.    ______________________________ Laurice Record, [name] LIO/MEDQ  D:  09/21/2011  T:  09/21/2011  Job:  [id number]

## 2011-09-21 NOTE — Telephone Encounter (Signed)
Nyoka Lint, nurse from [facility] called this morning to verify an order for daily iodoform packing of pt's wound until her f/u with Dr. Donell Beers here in our office.  Verified instructions from Dr. Rosezena Sensor dictation from pt's prior office visit.

## 2011-09-22 ENCOUNTER — Telehealth: Payer: Self-pay | Admitting: Hematology and Oncology

## 2011-09-22 ENCOUNTER — Encounter (HOSPITAL_COMMUNITY): Payer: Medicare Other

## 2011-09-22 ENCOUNTER — Ambulatory Visit (INDEPENDENT_AMBULATORY_CARE_PROVIDER_SITE_OTHER): Payer: Medicare Other | Admitting: General Surgery

## 2011-09-22 ENCOUNTER — Ambulatory Visit (HOSPITAL_COMMUNITY): Payer: Self-pay

## 2011-09-22 ENCOUNTER — Encounter (INDEPENDENT_AMBULATORY_CARE_PROVIDER_SITE_OTHER): Payer: Self-pay | Admitting: General Surgery

## 2011-09-22 DIAGNOSIS — C20 Malignant neoplasm of rectum: Secondary | ICD-10-CM

## 2011-09-22 LAB — IRON AND TIBC
%SAT: 9 % — ABNORMAL LOW (ref 20–55)
Iron: 28 ug/dL — ABNORMAL LOW (ref 42–145)
TIBC: 323 ug/dL (ref 250–470)
UIBC: 295 ug/dL (ref 125–400)

## 2011-09-22 LAB — FOLATE: Folate: 9.6 ng/mL

## 2011-09-22 LAB — FERRITIN: Ferritin: 61 ng/mL (ref 10–291)

## 2011-09-22 NOTE — Telephone Encounter (Signed)
Sent xeloda prescription to [facility] pharmacy °

## 2011-09-22 NOTE — Progress Notes (Signed)
HISTORY: Pt overall doing well.  No fevers/ chills.  Pt denies vomiting.  She has occasional bouts of nausea and her appetite is poor, but she is able to keep down enough liquid and food.  She had some wound drainage in the form of a seroma and had this opened a small amount at the lower border of the incision.  She has been doing better with her ostomy appliance.      EXAM: Head: Normocephalic and atraumatic.  Eyes:  Conjunctivae are normal. Pupils are equal, round, and reactive to light. No scleral icterus.  Neck:  Normal range of motion. Neck supple. No tracheal deviation present. No thyromegaly present.  Resp: No respiratory distress, normal effort. Abd:  Abdomen is soft, non distended.   There is no rebound and no guarding.  Appropriately tender.  No sign of wound infection.  Ostomy output correct consistency. Neurological: Alert and oriented to person, place, and time. Coordination normal.  Skin: Skin is warm and dry. No rash noted. No diaphoretic. No erythema. No pallor.  Psychiatric: Normal mood and affect. Normal behavior. Judgment and thought content normal.      ASSESSMENT AND PLAN:   Rectal cancer, midrectum ypT3ypN0 (0/7 LN), s/p neoadj chemoXRT, lap LAR with diverting loop ileostomy Doing well post op. Still tired and weak, but labs OK.   Had a seroma with 1/4 " wick packing.   Follow up in 3-4 weeks. Reviewed things to call for including high watery ileostomy output.    Follow up in 3-4 weeks before chemo starts.       Maudry Diego, MD Surgical Oncology, General & Endocrine Surgery [facility]  Illene Regulus, MD, MD Debby Bud Carney Harder*

## 2011-09-22 NOTE — Patient Instructions (Signed)
Labs from Dr. Dalene Carrow looked good, other than slightly low iron.    Keep changing wick daily. Shower with wick out.  Keep an eye out for high watery ileostomy output.

## 2011-09-22 NOTE — Assessment & Plan Note (Signed)
Doing well post op. Still tired and weak, but labs OK.   Had a seroma with 1/4 " wick packing.   Follow up in 3-4 weeks. Reviewed things to call for including high watery ileostomy output.

## 2011-09-26 ENCOUNTER — Ambulatory Visit (HOSPITAL_BASED_OUTPATIENT_CLINIC_OR_DEPARTMENT_OTHER): Payer: Medicare Other

## 2011-09-26 VITALS — BP 110/60 | HR 78 | Temp 98.5°F

## 2011-09-26 DIAGNOSIS — D539 Nutritional anemia, unspecified: Secondary | ICD-10-CM

## 2011-09-26 MED ORDER — SODIUM CHLORIDE 0.9 % IV SOLN
1020.0000 mg | Freq: Once | INTRAVENOUS | Status: AC
  Administered 2011-09-26: 1020 mg via INTRAVENOUS
  Filled 2011-09-26: qty 34

## 2011-09-26 MED ORDER — SODIUM CHLORIDE 0.9 % IV SOLN
Freq: Once | INTRAVENOUS | Status: AC
  Administered 2011-09-26: 14:00:00 via INTRAVENOUS

## 2011-09-27 ENCOUNTER — Encounter (HOSPITAL_COMMUNITY): Payer: Medicare Other

## 2011-09-27 ENCOUNTER — Ambulatory Visit (HOSPITAL_COMMUNITY): Payer: Self-pay

## 2011-09-29 ENCOUNTER — Ambulatory Visit (HOSPITAL_COMMUNITY): Payer: Self-pay

## 2011-09-29 ENCOUNTER — Encounter (HOSPITAL_COMMUNITY): Payer: Medicare Other

## 2011-10-04 ENCOUNTER — Encounter (HOSPITAL_COMMUNITY): Payer: Medicare Other

## 2011-10-04 ENCOUNTER — Ambulatory Visit (HOSPITAL_COMMUNITY): Payer: Self-pay

## 2011-10-05 ENCOUNTER — Encounter (INDEPENDENT_AMBULATORY_CARE_PROVIDER_SITE_OTHER): Payer: Self-pay | Admitting: General Surgery

## 2011-10-05 ENCOUNTER — Ambulatory Visit (INDEPENDENT_AMBULATORY_CARE_PROVIDER_SITE_OTHER): Payer: Medicare Other | Admitting: General Surgery

## 2011-10-05 VITALS — BP 142/68 | HR 84 | Temp 98.8°F | Resp 16 | Ht 68.0 in | Wt 216.0 lb

## 2011-10-05 DIAGNOSIS — T8140XA Infection following a procedure, unspecified, initial encounter: Secondary | ICD-10-CM

## 2011-10-05 DIAGNOSIS — T8149XA Infection following a procedure, other surgical site, initial encounter: Secondary | ICD-10-CM

## 2011-10-05 NOTE — Progress Notes (Signed)
Subjective:     Patient ID: [name], female   DOB: 02-19-1946, 65 y.o.   MRN: [id number]  HPI Status post left assisted low anterior resection with diverting ileostomy. She had her surgery on November 15 and began having drainage from the lower aspect of her wound and this was opened up and has had serous drainage from. She has been packing the wound but on Sunday, the upper aspect of her wound opened up and she had some foul, dark drainage and the home health nurse recommended that she come in for evaluation. She denies any fevers and states her abdominal pain is improving. She's been feeling weak and nauseated.  Review of Systems     Objective:   Physical Exam  Her abdominal incision is opened at the inferior aspect that she also has a 1 cm opening at the upper aspect of her midline incision has some packing strip within the wound and this was removed and the wound was probed with a Q-tip with the return of purulent, foul-smelling drainage. I packed the wound with Nu Gauze. There is no cellulitis and there is no significant tenderness in the area.    Assessment:     Postoperative wound infection-draining She  appears to have a postoperative wound infection which is currently draining. There is no evidence of cellulitis or tenderness. Since this is RE draining, I think that with wound care this should heal up. I discussed the options of opening the wound up completely and the patient declined this. I think it's reasonable to try local wound care and packing irrigation and 3 times a day or potentially more since the patient tells me that there is less coming out of the wound today than over the last few days There is no evidence of cellulitis and I did not recommend any antibiotics at this time. This appeared to be a superficial wound infection and which is currently drained. I recommended that if her tinnitus increases or she has any fevers, chills or redness that she called back as soon as  possible for repeat evaluation. We will move up her followup appointment with Dr. Donell Beers to follow up the wound     Plan:     F/u in 1 week or sooner PRN, TID wound packing and irrigation

## 2011-10-06 ENCOUNTER — Encounter (HOSPITAL_COMMUNITY): Payer: Medicare Other

## 2011-10-06 ENCOUNTER — Ambulatory Visit (HOSPITAL_COMMUNITY): Payer: Self-pay

## 2011-10-10 ENCOUNTER — Encounter: Payer: Self-pay | Admitting: Nurse Practitioner

## 2011-10-11 ENCOUNTER — Encounter (HOSPITAL_COMMUNITY): Payer: Medicare Other

## 2011-10-11 ENCOUNTER — Ambulatory Visit (HOSPITAL_COMMUNITY): Payer: Self-pay

## 2011-10-12 ENCOUNTER — Telehealth (INDEPENDENT_AMBULATORY_CARE_PROVIDER_SITE_OTHER): Payer: Self-pay

## 2011-10-12 ENCOUNTER — Encounter (INDEPENDENT_AMBULATORY_CARE_PROVIDER_SITE_OTHER): Payer: Self-pay | Admitting: General Surgery

## 2011-10-12 ENCOUNTER — Ambulatory Visit (INDEPENDENT_AMBULATORY_CARE_PROVIDER_SITE_OTHER): Payer: Medicare Other | Admitting: General Surgery

## 2011-10-12 VITALS — BP 144/76 | HR 93 | Temp 97.6°F | Ht 68.0 in | Wt 209.0 lb

## 2011-10-12 DIAGNOSIS — C20 Malignant neoplasm of rectum: Secondary | ICD-10-CM

## 2011-10-12 MED ORDER — FLUCONAZOLE 200 MG PO TABS: 200.0000 mg | ORAL_TABLET | Freq: Every day | ORAL | Status: DC

## 2011-10-12 MED ORDER — NYSTATIN 100000 UNIT/GM EX POWD: CUTANEOUS | Status: DC

## 2011-10-12 MED ORDER — OXYCODONE-ACETAMINOPHEN 5-325 MG PO TABS: 1.0000 | ORAL_TABLET | Freq: Every day | ORAL | Status: DC

## 2011-10-12 MED ORDER — AMOXICILLIN-POT CLAVULANATE 875-125 MG PO TABS: 1.0000 | ORAL_TABLET | Freq: Two times a day (BID) | ORAL | Status: AC

## 2011-10-12 NOTE — Assessment & Plan Note (Addendum)
Delayed wound infection Wound opened. Start augmentin times 1 week.  Candidiasis of stoma. Diflucan times 1 week.  Nystatin powder around stoma.  Diarrhea:   May take up to 8 tabs of imodium per day.   If this does not improve with opening up wound, would check CT to make sure no intraabdominal abscess.

## 2011-10-12 NOTE — Telephone Encounter (Signed)
Called and left [facility] message to extend wet to dry BID dressing changes for three more weeks.  The pt is aware.

## 2011-10-12 NOTE — Progress Notes (Signed)
HISTORY: Pt with increased drainage of wound.  She has not been eating well and has lost weight since last visit.  She denies fevers/ chills.  She has some soreness at the incision, but is not taking increased amounts of narcotics.       EXAM: Head: Normocephalic and atraumatic.  Eyes:  Conjunctivae are normal. Pupils are equal, round, and reactive to light. No scleral icterus.  Neck:  Normal range of motion. Neck supple. No tracheal deviation present. No thyromegaly present.  Resp: No respiratory distress, normal effort. Abd:  Abdomen is soft, non distended.   There is no rebound and no guarding.  Appropriately tender.  Purulent drainage when pull out upper wick.  The incision is anesthetized, and the upper half is opened.   Ostomy output thin, candida around stoma.   Neurological: Alert and oriented to person, place, and time. Coordination normal.  Skin: Skin is warm and dry. No rash noted. No diaphoretic. No erythema. No pallor.  Psychiatric: Normal mood and affect. Normal behavior. Judgment and thought content normal.      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 Start augmentin times 1 week.  Candidiasis of stoma. Diflucan times 1 week.  Nystatin powder around stoma.  Diarrhea:   May take up to 8 tabs of imodium per day.   If this does not improve with opening up wound, would check CT to make sure no intraabdominal abscess.     Follow up in 1-3 weeks.   Maudry Diego, MD Surgical Oncology, General & Endocrine Surgery [facility]  Illene Regulus, MD, MD Debby Bud Carney Harder*

## 2011-10-12 NOTE — Patient Instructions (Signed)
Pack wound 1-2 times/day Remove all dressings from midline wound and shower daily.

## 2011-10-13 ENCOUNTER — Encounter (HOSPITAL_COMMUNITY): Payer: Medicare Other

## 2011-10-13 ENCOUNTER — Ambulatory Visit (HOSPITAL_COMMUNITY): Payer: Self-pay

## 2011-10-17 ENCOUNTER — Encounter (INDEPENDENT_AMBULATORY_CARE_PROVIDER_SITE_OTHER): Payer: Medicare Other | Admitting: General Surgery

## 2011-10-17 ENCOUNTER — Encounter (INDEPENDENT_AMBULATORY_CARE_PROVIDER_SITE_OTHER): Payer: Self-pay | Admitting: General Surgery

## 2011-10-17 ENCOUNTER — Ambulatory Visit (INDEPENDENT_AMBULATORY_CARE_PROVIDER_SITE_OTHER): Payer: Medicare Other | Admitting: General Surgery

## 2011-10-17 DIAGNOSIS — C20 Malignant neoplasm of rectum: Secondary | ICD-10-CM

## 2011-10-17 DIAGNOSIS — B379 Candidiasis, unspecified: Secondary | ICD-10-CM

## 2011-10-17 MED ORDER — FLUCONAZOLE 200 MG PO TABS: 200.0000 mg | ORAL_TABLET | Freq: Every day | ORAL | Status: AC

## 2011-10-17 NOTE — Assessment & Plan Note (Signed)
Follow up at end of month for wound check.   Will determine length of time prior to ileostomy takedown.

## 2011-10-17 NOTE — Assessment & Plan Note (Signed)
Diflucan orally for another week. Desitin ointment for barrier cream

## 2011-10-17 NOTE — Progress Notes (Signed)
HISTORY: Pt doing much better after wound reopened.  Drainage decreased dramatically.  Still having issues with redness and irritation under ileostomy, though.     EXAM: Head: Normocephalic and atraumatic.  Eyes:  Conjunctivae are normal. Pupils are equal, round, and reactive to light. No scleral icterus.  Resp: No respiratory distress, normal effort. Abd:  Abdomen is soft, non distended and non tender. No masses are palpable.  There is no rebound and no guarding. Incision looks good with packing.  Erythema under stoma with skin breakdown.   Neurological: Alert and oriented to person, place, and time. Coordination normal.  Skin: Skin is warm and dry. No rash noted. No diaphoretic. No erythema. No pallor.  Psychiatric: Normal mood and affect. Normal behavior. Judgment and thought content normal.    ASSESSMENT AND PLAN:   Candidiasis Diflucan orally for another week. Desitin ointment for barrier cream  Rectal cancer, midrectum ypT3ypN0 (0/7 LN), s/p neoadj chemoXRT, lap LAR with diverting loop ileostomy Follow up at end of month for wound check.   Will determine length of time prior to ileostomy takedown.         Maudry Diego, MD Surgical Oncology, General & Endocrine Surgery [facility]  Illene Regulus, MD, MD Debby Bud Carney Harder*

## 2011-10-17 NOTE — Patient Instructions (Signed)
Desitin to skin folds and irritation of right abdomen under ileostomy.  A&D or bacitracin or other ointment to left side  10 days diflucan  Continue daily wet to dry packing to abdominal wound.

## 2011-10-18 ENCOUNTER — Encounter (HOSPITAL_COMMUNITY): Payer: Medicare Other

## 2011-10-18 ENCOUNTER — Ambulatory Visit (HOSPITAL_COMMUNITY): Payer: Self-pay

## 2011-10-20 ENCOUNTER — Encounter (HOSPITAL_COMMUNITY): Payer: Medicare Other

## 2011-10-20 ENCOUNTER — Ambulatory Visit (HOSPITAL_COMMUNITY): Payer: Self-pay

## 2011-10-20 ENCOUNTER — Other Ambulatory Visit: Payer: Self-pay | Admitting: *Deleted

## 2011-10-20 DIAGNOSIS — C20 Malignant neoplasm of rectum: Secondary | ICD-10-CM

## 2011-10-21 ENCOUNTER — Ambulatory Visit (HOSPITAL_BASED_OUTPATIENT_CLINIC_OR_DEPARTMENT_OTHER): Payer: Medicare Other | Admitting: Hematology and Oncology

## 2011-10-21 ENCOUNTER — Other Ambulatory Visit (HOSPITAL_BASED_OUTPATIENT_CLINIC_OR_DEPARTMENT_OTHER): Payer: Medicare Other | Admitting: Lab

## 2011-10-21 VITALS — BP 104/58 | HR 75 | Temp 97.8°F | Ht 68.0 in | Wt 205.8 lb

## 2011-10-21 DIAGNOSIS — C20 Malignant neoplasm of rectum: Secondary | ICD-10-CM

## 2011-10-21 DIAGNOSIS — Z9889 Other specified postprocedural states: Secondary | ICD-10-CM

## 2011-10-21 DIAGNOSIS — Z79899 Other long term (current) drug therapy: Secondary | ICD-10-CM

## 2011-10-21 DIAGNOSIS — Z923 Personal history of irradiation: Secondary | ICD-10-CM

## 2011-10-21 LAB — COMPREHENSIVE METABOLIC PANEL
ALT: 8 U/L (ref 0–35)
AST: 15 U/L (ref 0–37)
Albumin: 4.1 g/dL (ref 3.5–5.2)
Alkaline Phosphatase: 80 U/L (ref 39–117)
BUN: 15 mg/dL (ref 6–23)
CO2: 23 mEq/L (ref 19–32)
Calcium: 9.7 mg/dL (ref 8.4–10.5)
Chloride: 103 mEq/L (ref 96–112)
Creatinine, Ser: 1.04 mg/dL (ref 0.50–1.10)
Glucose, Bld: 98 mg/dL (ref 70–99)
Potassium: 4.2 mEq/L (ref 3.5–5.3)
Sodium: 138 mEq/L (ref 135–145)
Total Bilirubin: 0.4 mg/dL (ref 0.3–1.2)
Total Protein: 6.9 g/dL (ref 6.0–8.3)

## 2011-10-21 LAB — CBC WITH DIFFERENTIAL/PLATELET
BASO%: 0.7 % (ref 0.0–2.0)
Basophils Absolute: 0 10*3/uL (ref 0.0–0.1)
EOS%: 2 % (ref 0.0–7.0)
Eosinophils Absolute: 0.1 10*3/uL (ref 0.0–0.5)
HCT: 36.4 % (ref 34.8–46.6)
HGB: 12.1 g/dL (ref 11.6–15.9)
LYMPH%: 9.4 % — ABNORMAL LOW (ref 14.0–49.7)
MCH: 29.8 pg (ref 25.1–34.0)
MCHC: 33.4 g/dL (ref 31.5–36.0)
MCV: 89.3 fL (ref 79.5–101.0)
MONO#: 0.4 10*3/uL (ref 0.1–0.9)
MONO%: 9.9 % (ref 0.0–14.0)
NEUT#: 3.5 10*3/uL (ref 1.5–6.5)
NEUT%: 78 % — ABNORMAL HIGH (ref 38.4–76.8)
Platelets: 208 10*3/uL (ref 145–400)
RBC: 4.07 10*6/uL (ref 3.70–5.45)
RDW: 17 % — ABNORMAL HIGH (ref 11.2–14.5)
WBC: 4.5 10*3/uL (ref 3.9–10.3)
lymph#: 0.4 10*3/uL — ABNORMAL LOW (ref 0.9–3.3)

## 2011-10-21 NOTE — Progress Notes (Signed)
This office note has been dictated.

## 2011-10-22 ENCOUNTER — Other Ambulatory Visit: Payer: Self-pay | Admitting: Internal Medicine

## 2011-10-24 ENCOUNTER — Telehealth (INDEPENDENT_AMBULATORY_CARE_PROVIDER_SITE_OTHER): Payer: Self-pay

## 2011-10-24 NOTE — Telephone Encounter (Signed)
Cindy at [facility] called req order for Hydrocoloid patch for a pressure sore pt is getting on side of buttock. Please call [phone] to give order.

## 2011-10-24 NOTE — Progress Notes (Signed)
CC:   Rosalyn Gess. Norins, MD [name], MD Dr. Maia Breslow [name], MD [name], [name], [name]  IDENTIFYING STATEMENT:  The patient is a 66 year old woman with rectal cancer who presents for followup.  INTERVAL HISTORY:  The patient reports an infected surgical wound. Underwent an I and D and is currently on antibiotics.  Has ongoing weakness, but no fever or chills.  Has a followup visit with Dr. Donell Beers later this month.  Bowels are moving without difficulty.  Has nausea, but no vomiting.  Her weight is stable.  She has Xeloda on hand when ready to begin therapy.  MEDICATIONS:  Medications reviewed and updated.  ALLERGIES:  Sulfa, codeine, and prednisone.  PAST MEDICAL HISTORY:  Unchanged.  FAMILY HISTORY:  Unchanged.  SOCIAL HISTORY:  Unchanged.  REVIEW OF SYSTEMS:  Review of systems as above and rest review of systems negative.  PHYSICAL EXAMINATION:  General:  Alert and oriented x3.  Vitals:  Pulse 75, blood pressure 104/78, temperature 97.8, respirations 20, weight 201.6 pounds.  HEENT:  Head is atraumatic, normocephalic.  Sclerae anicteric.  Mouth moist.  Chest:  Clear.  Abdomen:  Soft, nontender. Colostomy noted with semi-formed stool.  Bowel sounds present. Extremities:  No calf tenderness.  Lymph Nodes:  No adenopathy.  CNS: Nonfocal.  LABORATORY DATA:  10/21/2011 white cell count 4.5, hemoglobin 12.1, hematocrit 36.4, platelets 208.  CMET pending.  IMPRESSION AND PLAN:  [name] is a 66 year old woman with a uT3 N1 low-lying invasive rectal adenocarcinoma diagnosed in August 2012.  She is status post concurrent Xeloda with radiation therapy from 05/30/2011 through 07/07/2011.  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  She is status post I and D of the surgical wound and is on antibiotics.  She was due to begin Xeloda,  however, due to her current issues, we will hold Xeloda. Followup appointment with Dr. Donell Beers later this month. If she is cleared, she will begin Xeloda dosed at 1500 mg p.o. B.i.d. for 14 days, followed by a week's rest.  Of note, she received Feraheme on 09/26/2011 for anemia.  Hemoglobin is currently stable.    ______________________________ Laurice Record, [name] LIO/MEDQ  D:  10/21/2011  T:  10/22/2011  Job:  [id number]

## 2011-10-24 NOTE — Telephone Encounter (Signed)
Spoke with Arline Asp from [facility] to ok order for Hydrocoloid for pt's pressure ulcer.

## 2011-10-24 NOTE — Telephone Encounter (Signed)
Fine to order hydrocolloid.

## 2011-10-25 ENCOUNTER — Encounter (HOSPITAL_COMMUNITY): Payer: Medicare Other

## 2011-10-27 ENCOUNTER — Encounter (HOSPITAL_COMMUNITY): Payer: Medicare Other

## 2011-11-01 ENCOUNTER — Encounter (HOSPITAL_COMMUNITY): Payer: Medicare Other

## 2011-11-03 ENCOUNTER — Encounter (HOSPITAL_COMMUNITY): Payer: Medicare Other

## 2011-11-03 ENCOUNTER — Telehealth (INDEPENDENT_AMBULATORY_CARE_PROVIDER_SITE_OTHER): Payer: Self-pay

## 2011-11-03 NOTE — Telephone Encounter (Signed)
Nurse from [facility] called requesting verbal okay for 60 days recertification period for the patient.  Verbal okay given.

## 2011-11-07 ENCOUNTER — Ambulatory Visit (INDEPENDENT_AMBULATORY_CARE_PROVIDER_SITE_OTHER): Payer: Medicare Other | Admitting: General Surgery

## 2011-11-07 ENCOUNTER — Encounter (INDEPENDENT_AMBULATORY_CARE_PROVIDER_SITE_OTHER): Payer: Self-pay | Admitting: General Surgery

## 2011-11-07 ENCOUNTER — Other Ambulatory Visit (INDEPENDENT_AMBULATORY_CARE_PROVIDER_SITE_OTHER): Payer: Self-pay | Admitting: General Surgery

## 2011-11-07 VITALS — BP 116/64 | HR 70 | Temp 98.3°F | Resp 18 | Ht 68.0 in | Wt 197.6 lb

## 2011-11-07 DIAGNOSIS — C20 Malignant neoplasm of rectum: Secondary | ICD-10-CM

## 2011-11-07 LAB — COMPREHENSIVE METABOLIC PANEL
ALT: 14 U/L (ref 0–35)
AST: 21 U/L (ref 0–37)
Albumin: 4.3 g/dL (ref 3.5–5.2)
Alkaline Phosphatase: 103 U/L (ref 39–117)
BUN: 38 mg/dL — ABNORMAL HIGH (ref 6–23)
CO2: 22 mEq/L (ref 19–32)
Calcium: 10.4 mg/dL (ref 8.4–10.5)
Chloride: 98 mEq/L (ref 96–112)
Creat: 2.15 mg/dL — ABNORMAL HIGH (ref 0.50–1.10)
Glucose, Bld: 105 mg/dL — ABNORMAL HIGH (ref 70–99)
Potassium: 5.1 mEq/L (ref 3.5–5.3)
Sodium: 135 mEq/L (ref 135–145)
Total Bilirubin: 0.5 mg/dL (ref 0.3–1.2)
Total Protein: 7.2 g/dL (ref 6.0–8.3)

## 2011-11-07 MED ORDER — AMOXICILLIN-POT CLAVULANATE 875-125 MG PO TABS: 1.0000 | ORAL_TABLET | Freq: Two times a day (BID) | ORAL | Status: DC

## 2011-11-07 MED ORDER — DIPHENOXYLATE-ATROPINE 2.5-0.025 MG PO TABS: 2.0000 | ORAL_TABLET | Freq: Four times a day (QID) | ORAL | Status: AC | PRN

## 2011-11-07 NOTE — Patient Instructions (Signed)
Change to lomotil from imodium  Take 2 weeks augmentin.  Get labs drawn today.  Get CT abd/pelvis.

## 2011-11-07 NOTE — Assessment & Plan Note (Signed)
Concerned about possible dehydration, electrolyte abnormality, or abscess due to weakness, nausea and syncope.  Will get CMET, CBC, and CT scan abd/pelvis to eval the above.    Follow up in 2 weeks.

## 2011-11-07 NOTE — Progress Notes (Signed)
HISTORY: Pt feeling very weak, sleeping all the time.  She passed out 2 weeks ago.  Her husband is doing dressing changes with Nugauze 2 times/day and it appears that this is closing.  Sometimes purulent drainage comes out, sometimes not.  She is having intermittently high watery ileostomy output.  She denies pain, and has not taken narcotics for around 1 month.  She has nausea and has difficulty eating.  She denies fevers/ chills.     PERTINENT REVIEW OF SYSTEMS: Otherwise negative.    EXAM: Head: Normocephalic and atraumatic.  Eyes:  Mucous membranes appear dry.  Pt has lost weight.     Resp: No respiratory distress, normal effort. Abd:  Abdomen is soft, non distended and non tender. Ileostomy pink, has watery stool in it.  Midline wound with small opening.  Repacked with 1/4" packing.  Some purulent drainage expressed.  No erythema or tenderness.   Neurological: Alert and oriented to person, place, and time. Coordination normal.  Skin: Skin is warm and dry. No rash noted. No diaphoretic. Slightly pale.    Psychiatric: Normal mood and affect. Normal behavior. Judgment and thought content normal.    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  Will get CMET, CBC, and CT scan abd/pelvis to eval the above.    Follow up in 2 weeks.       Maudry Diego, MD Surgical Oncology, General & Endocrine Surgery [facility]  Illene Regulus, MD, MD Debby Bud Carney Harder*

## 2011-11-08 ENCOUNTER — Encounter (HOSPITAL_COMMUNITY): Payer: Self-pay

## 2011-11-08 ENCOUNTER — Inpatient Hospital Stay (HOSPITAL_COMMUNITY): Payer: Medicare Other

## 2011-11-08 ENCOUNTER — Other Ambulatory Visit (INDEPENDENT_AMBULATORY_CARE_PROVIDER_SITE_OTHER): Payer: Self-pay | Admitting: General Surgery

## 2011-11-08 ENCOUNTER — Inpatient Hospital Stay (HOSPITAL_COMMUNITY)
Admission: AD | Admit: 2011-11-08 | Discharge: 2011-11-11 | DRG: 863 | Disposition: A | Discharge status: Home Health | Payer: Medicare Other | Source: Ambulatory Visit | Attending: Surgery | Admitting: Surgery

## 2011-11-08 ENCOUNTER — Other Ambulatory Visit: Payer: Medicare Other

## 2011-11-08 ENCOUNTER — Encounter (HOSPITAL_COMMUNITY): Payer: Medicare Other

## 2011-11-08 DIAGNOSIS — Z981 Arthrodesis status: Secondary | ICD-10-CM

## 2011-11-08 DIAGNOSIS — Z85048 Personal history of other malignant neoplasm of rectum, rectosigmoid junction, and anus: Secondary | ICD-10-CM | POA: Insufficient documentation

## 2011-11-08 DIAGNOSIS — C20 Malignant neoplasm of rectum: Secondary | ICD-10-CM | POA: Diagnosis present

## 2011-11-08 DIAGNOSIS — Z432 Encounter for attention to ileostomy: Secondary | ICD-10-CM

## 2011-11-08 DIAGNOSIS — Y838 Other surgical procedures as the cause of abnormal reaction of the patient, or of later complication, without mention of misadventure at the time of the procedure: Secondary | ICD-10-CM | POA: Diagnosis present

## 2011-11-08 DIAGNOSIS — K219 Gastro-esophageal reflux disease without esophagitis: Secondary | ICD-10-CM | POA: Insufficient documentation

## 2011-11-08 DIAGNOSIS — J4489 Other specified chronic obstructive pulmonary disease: Secondary | ICD-10-CM | POA: Diagnosis present

## 2011-11-08 DIAGNOSIS — Z932 Ileostomy status: Secondary | ICD-10-CM

## 2011-11-08 DIAGNOSIS — D539 Nutritional anemia, unspecified: Secondary | ICD-10-CM

## 2011-11-08 DIAGNOSIS — J449 Chronic obstructive pulmonary disease, unspecified: Secondary | ICD-10-CM | POA: Diagnosis present

## 2011-11-08 DIAGNOSIS — E860 Dehydration: Secondary | ICD-10-CM | POA: Diagnosis present

## 2011-11-08 DIAGNOSIS — T8140XA Infection following a procedure, unspecified, initial encounter: Principal | ICD-10-CM | POA: Diagnosis present

## 2011-11-08 DIAGNOSIS — R944 Abnormal results of kidney function studies: Secondary | ICD-10-CM | POA: Diagnosis present

## 2011-11-08 LAB — CBC WITH DIFFERENTIAL/PLATELET
Basophils Absolute: 0 10*3/uL (ref 0.0–0.1)
Basophils Relative: 1 % (ref 0–1)
Eosinophils Absolute: 0.1 10*3/uL (ref 0.0–0.7)
Eosinophils Relative: 2 % (ref 0–5)
HCT: 39.7 % (ref 36.0–46.0)
Hemoglobin: 12.8 g/dL (ref 12.0–15.0)
Lymphocytes Relative: 11 % — ABNORMAL LOW (ref 12–46)
Lymphs Abs: 0.6 10*3/uL — ABNORMAL LOW (ref 0.7–4.0)
MCH: 29.1 pg (ref 26.0–34.0)
MCHC: 32.2 g/dL (ref 30.0–36.0)
MCV: 90.2 fL (ref 78.0–100.0)
Monocytes Absolute: 0.6 10*3/uL (ref 0.1–1.0)
Monocytes Relative: 10 % (ref 3–12)
Neutro Abs: 4.5 10*3/uL (ref 1.7–7.7)
Neutrophils Relative %: 77 % (ref 43–77)
Platelets: 235 10*3/uL (ref 150–400)
RBC: 4.4 MIL/uL (ref 3.87–5.11)
RDW: 15.7 % — ABNORMAL HIGH (ref 11.5–15.5)
WBC: 5.9 10*3/uL (ref 4.0–10.5)

## 2011-11-08 LAB — CBC
HCT: 39.7 % (ref 36.0–46.0)
Hemoglobin: 13.4 g/dL (ref 12.0–15.0)
MCH: 29.5 pg (ref 26.0–34.0)
MCHC: 33.8 g/dL (ref 30.0–36.0)
MCV: 87.4 fL (ref 78.0–100.0)
Platelets: 207 10*3/uL (ref 150–400)
RBC: 4.54 MIL/uL (ref 3.87–5.11)
RDW: 15.1 % (ref 11.5–15.5)
WBC: 5.6 10*3/uL (ref 4.0–10.5)

## 2011-11-08 LAB — BASIC METABOLIC PANEL
BUN: 35 mg/dL — ABNORMAL HIGH (ref 6–23)
CO2: 19 mEq/L (ref 19–32)
Calcium: 11.1 mg/dL — ABNORMAL HIGH (ref 8.4–10.5)
Chloride: 96 mEq/L (ref 96–112)
Creatinine, Ser: 1.93 mg/dL — ABNORMAL HIGH (ref 0.50–1.10)
GFR calc Af Amer: 30 mL/min — ABNORMAL LOW (ref >90)
GFR calc non Af Amer: 26 mL/min — ABNORMAL LOW (ref >90)
Glucose, Bld: 100 mg/dL — ABNORMAL HIGH (ref 70–99)
Potassium: 5.3 mEq/L — ABNORMAL HIGH (ref 3.5–5.1)
Sodium: 130 mEq/L — ABNORMAL LOW (ref 135–145)

## 2011-11-08 MED ORDER — FLUTICASONE-SALMETEROL 100-50 MCG/DOSE IN AEPB
1.0000 | INHALATION_SPRAY | Freq: Two times a day (BID) | RESPIRATORY_TRACT | Status: DC
  Administered 2011-11-08 – 2011-11-11 (×7): 1 via RESPIRATORY_TRACT
  Filled 2011-11-08: qty 14

## 2011-11-08 MED ORDER — DIPHENHYDRAMINE HCL 50 MG/ML IJ SOLN: 12.5000 mg | Freq: Four times a day (QID) | INTRAMUSCULAR | Status: DC | PRN

## 2011-11-08 MED ORDER — PANTOPRAZOLE SODIUM 40 MG IV SOLR
40.0000 mg | Freq: Every day | INTRAVENOUS | Status: DC
  Administered 2011-11-08 – 2011-11-09 (×2): 40 mg via INTRAVENOUS
  Filled 2011-11-08 (×3): qty 40

## 2011-11-08 MED ORDER — LACTATED RINGERS IV SOLN
INTRAVENOUS | Status: DC
  Administered 2011-11-08 – 2011-11-09 (×3): via INTRAVENOUS
  Administered 2011-11-09: 1000 via INTRAVENOUS

## 2011-11-08 MED ORDER — DIPHENHYDRAMINE HCL 12.5 MG/5ML PO ELIX: 12.5000 mg | ORAL_SOLUTION | Freq: Four times a day (QID) | ORAL | Status: DC | PRN

## 2011-11-08 MED ORDER — ACETAMINOPHEN 325 MG PO TABS
650.0000 mg | ORAL_TABLET | Freq: Four times a day (QID) | ORAL | Status: DC | PRN
  Administered 2011-11-11: 650 mg via ORAL
  Filled 2011-11-08: qty 2

## 2011-11-08 MED ORDER — CAPECITABINE 500 MG PO TABS: 1500.0000 mg | ORAL_TABLET | Freq: Two times a day (BID) | ORAL | Status: DC

## 2011-11-08 MED ORDER — PANTOPRAZOLE SODIUM 40 MG PO TBEC: 40.0000 mg | DELAYED_RELEASE_TABLET | Freq: Every day | ORAL | Status: DC

## 2011-11-08 MED ORDER — ACETAMINOPHEN 650 MG RE SUPP: 650.0000 mg | Freq: Four times a day (QID) | RECTAL | Status: DC | PRN

## 2011-11-08 MED ORDER — ONDANSETRON HCL 4 MG/2ML IJ SOLN
4.0000 mg | Freq: Four times a day (QID) | INTRAMUSCULAR | Status: DC | PRN
  Administered 2011-11-08 – 2011-11-10 (×2): 4 mg via INTRAVENOUS
  Filled 2011-11-08 (×3): qty 2

## 2011-11-08 MED ORDER — DIBUCAINE 1 % RE OINT
1.0000 "application " | TOPICAL_OINTMENT | Freq: Every day | RECTAL | Status: DC
  Filled 2011-11-08: qty 28

## 2011-11-08 MED ORDER — NYSTATIN 100000 UNIT/GM EX POWD
Freq: Two times a day (BID) | CUTANEOUS | Status: DC
  Administered 2011-11-09 – 2011-11-11 (×4): via TOPICAL
  Filled 2011-11-08 (×8): qty 15

## 2011-11-08 MED ORDER — HYDROCODONE-ACETAMINOPHEN 5-325 MG PO TABS: 1.0000 | ORAL_TABLET | ORAL | Status: DC | PRN

## 2011-11-08 MED ORDER — MONTELUKAST SODIUM 10 MG PO TABS
10.0000 mg | ORAL_TABLET | Freq: Every day | ORAL | Status: DC
  Administered 2011-11-08 – 2011-11-10 (×3): 10 mg via ORAL
  Filled 2011-11-08 (×4): qty 1

## 2011-11-08 MED ORDER — DILTIAZEM HCL ER COATED BEADS 120 MG PO CP24
120.0000 mg | ORAL_CAPSULE | Freq: Every day | ORAL | Status: DC
  Administered 2011-11-09 – 2011-11-10 (×2): 120 mg via ORAL
  Filled 2011-11-08 (×4): qty 1

## 2011-11-08 MED ORDER — AMOXICILLIN-POT CLAVULANATE 875-125 MG PO TABS
1.0000 | ORAL_TABLET | Freq: Two times a day (BID) | ORAL | Status: DC
  Filled 2011-11-08 (×2): qty 1

## 2011-11-08 MED ORDER — AMOXICILLIN-POT CLAVULANATE 875-125 MG PO TABS
1.0000 | ORAL_TABLET | Freq: Two times a day (BID) | ORAL | Status: DC
  Administered 2011-11-08 – 2011-11-11 (×6): 1 via ORAL
  Filled 2011-11-08 (×8): qty 1

## 2011-11-08 MED ORDER — DIPHENOXYLATE-ATROPINE 2.5-0.025 MG PO TABS: 2.0000 | ORAL_TABLET | Freq: Four times a day (QID) | ORAL | Status: DC | PRN

## 2011-11-08 MED ORDER — ASPIRIN EC 81 MG PO TBEC
81.0000 mg | DELAYED_RELEASE_TABLET | Freq: Every day | ORAL | Status: DC
  Administered 2011-11-08 – 2011-11-10 (×3): 81 mg via ORAL
  Filled 2011-11-08 (×4): qty 1

## 2011-11-08 MED ORDER — DIPHENOXYLATE-ATROPINE 2.5-0.025 MG PO TABS: 2.0000 | ORAL_TABLET | Freq: Four times a day (QID) | ORAL | Status: DC

## 2011-11-08 MED ORDER — ENOXAPARIN SODIUM 30 MG/0.3ML ~~LOC~~ SOLN
30.0000 mg | Freq: Every day | SUBCUTANEOUS | Status: DC
  Administered 2011-11-08 – 2011-11-09 (×2): 30 mg via SUBCUTANEOUS
  Filled 2011-11-08 (×3): qty 0.3

## 2011-11-08 MED ORDER — PROCHLORPERAZINE MALEATE 10 MG PO TABS
10.0000 mg | ORAL_TABLET | Freq: Four times a day (QID) | ORAL | Status: DC | PRN
  Administered 2011-11-08 – 2011-11-10 (×2): 10 mg via ORAL
  Filled 2011-11-08 (×2): qty 1

## 2011-11-08 NOTE — H&P (Signed)
[name] is an 66 y.o. female.   Chief Complaint: Dehydration HPI:  Pt is s/p LAR with ileostomy.  She was seen in office yesterday with significant weight loss and an episode of syncope several weeks ago.  She was experiencing new nausea and threw up at home.  She has an open wound that is being packed.  She denies fevers/ chills.  She does have watery ileostomy output at times.  We got labs yesterday which resulted in the early AM.  Her Cr is up to 2.    Past Medical History  Diagnosis Date  . IBS (irritable bowel syndrome)   . Hyperlipidemia   . Allergic rhinitis   . History of pneumonia   . COPD (chronic obstructive pulmonary disease)   . Asthma   . Heart murmur     Aortic  . Lung nodule     RLL nodule-21mm stable 2006, April 2009, and June 2009  . Candidiasis of breast   . Anemia   . Hyperlipemia   . Obesity   . Unspecified essential hypertension   . Heart valve problem     PER CARDIOLOGIST 05-03-2011  . Sleep difficulties   . Shortness of breath   . Hemorrhoids   . Cancer     rectal  . PONV (postoperative nausea and vomiting)     SEVERE N&V AFTER SURGERIES  . Sleep apnea     PT TOLD BORDERLINE-TRIED CPAP-DID NOT HELP-SHE DOES NOT USE CPAP  . Arthritis   . Ileostomy care 09/01/2011    Past Surgical History  Procedure Date  . Foot surgery     hammer toe  . Hand surgery     carpal tunnel  . Back surgery     2006 SPACER---2007 LUMBAR FUSION  . Tonsillectomy   . Colon resection 08/25/2011    Procedure: COLON RESECTION LAPAROSCOPIC;  Surgeon: [name], MD;  Location: [facility];  Service: General;  Laterality: N/A;  Laparoscopic Assisted Low Anterior Resection Diverting Ostomy and onQ pain pump  . Ileostomy     Family History  Problem Relation Age of Onset  . Lung cancer Father   . Cancer Father     lung  . Diabetes    . Heart disease    . Heart disease Mother   . Diabetes Mother    Social History:  reports that she quit smoking about 36 years ago.  Her smoking use included Cigarettes. She has a 15 pack-year smoking history. She has never used smokeless tobacco. She reports that she drinks alcohol. She reports that she does not use illicit drugs.  Allergies:  Allergies  Allergen Reactions  . Codeine Nausea Only  . Prednisone Hives    In different places all over the body.  . Sulfonamide Derivatives Nausea Only    Medications Prior to Admission  Medication Sig Dispense Refill  . ADVAIR DISKUS 100-50 MCG/DOSE AEPB INHALE 1 DOSE BY MOUTH TWICE A DAY  60 each  5  . albuterol (PROVENTIL) (2.5 MG/3ML) 0.083% nebulizer solution Take 2.5 mg by nebulization as needed. SHORTNESS OF BREATH      . albuterol (PROVENTIL,VENTOLIN) 90 MCG/ACT inhaler Inhale 2 puffs into the lungs as needed. SHORTNESS OF BREATH      . amoxicillin-clavulanate (AUGMENTIN) 875-125 MG per tablet Take 1 tablet by mouth 2 (two) times daily.  28 tablet  0  . aspirin 81 MG tablet Take 81 mg by mouth at bedtime.       . Calcium Carbonate-Vitamin  D (CALCIUM 600 + D PO) Take 600 mg by mouth 2 (two) times daily.       . capecitabine (XELODA) 500 MG tablet Take 1,500 mg by mouth. Take 3 tabs po  BID  X  14  Days  Followed  By  7  Days rest. Gave rx  To  [place],  Care management. Has  5  Refills.       . dibucaine (NUPERCAINAL) 1 % OINT Place 1 application rectally daily.        Marland Kitchen diltiazem (CARDIZEM CD) 120 MG 24 hr capsule Take 120 mg by mouth at bedtime.       . diphenoxylate-atropine (LOMOTIL) 2.5-0.025 MG per tablet Take 2 tablets by mouth 4 (four) times daily as needed for diarrhea or loose stools.  90 tablet  3  . loperamide (IMODIUM) 2 MG capsule Take 2 mg by mouth 4 (four) times daily as needed.       Marland Kitchen losartan (COZAAR) 50 MG tablet daily.      Marland Kitchen lovastatin (MEVACOR) 40 MG tablet take 1 tablet by mouth once daily  30 tablet  1  . mometasone (NASONEX) 50 MCG/ACT nasal spray Place 2 sprays into the nose as needed. ALLERGIES      . montelukast (SINGULAIR) 10 MG tablet Take  10 mg by mouth at bedtime.       Marland Kitchen nystatin (MYCOSTATIN) powder Apply to affected area as needed (around stoma)  15 g  0  . oxyCODONE-acetaminophen (PERCOCET) 5-325 MG per tablet Take 1 tablet by mouth at bedtime.  30 tablet  0  . pantoprazole (PROTONIX) 40 MG tablet Take 40 mg by mouth daily.       . prochlorperazine (COMPAZINE) 10 MG tablet Take 10 mg by mouth Ad lib. NAUSEA      . SPIRIVA HANDIHALER 18 MCG inhalation capsule inhale the contents of one capsule inTO the handihaler daily  30 capsule  1   No current facility-administered medications on file as of 11/08/2011.    Results for orders placed in visit on 11/07/11 (from the past 48 hour(s))  CBC WITH DIFFERENTIAL     Status: Abnormal   Collection Time   11/07/11  1:15 PM      Component Value Range Comment   WBC 5.9  4.0 - 10.5 (K/uL)    RBC 4.40  3.87 - 5.11 (MIL/uL)    Hemoglobin 12.8  12.0 - 15.0 (g/dL)    HCT 13.0  86.5 - 78.4 (%)    MCV 90.2  78.0 - 100.0 (fL)    MCH 29.1  26.0 - 34.0 (pg)    MCHC 32.2  30.0 - 36.0 (g/dL)    RDW 69.6 (*) 29.5 - 15.5 (%)    Platelets 235  150 - 400 (K/uL)    Neutrophils Relative 77  43 - 77 (%)    Neutro Abs 4.5  1.7 - 7.7 (K/uL)    Lymphocytes Relative 11 (*) 12 - 46 (%)    Lymphs Abs 0.6 (*) 0.7 - 4.0 (K/uL)    Monocytes Relative 10  3 - 12 (%)    Monocytes Absolute 0.6  0.1 - 1.0 (K/uL)    Eosinophils Relative 2  0 - 5 (%)    Eosinophils Absolute 0.1  0.0 - 0.7 (K/uL)    Basophils Relative 1  0 - 1 (%)    Basophils Absolute 0.0  0.0 - 0.1 (K/uL)    Smear Review Criteria for review not met  COMPREHENSIVE METABOLIC PANEL     Status: Abnormal   Collection Time   11/07/11  1:15 PM      Component Value Range Comment   Sodium 135  135 - 145 (mEq/L)    Potassium 5.1  3.5 - 5.3 (mEq/L)    Chloride 98  96 - 112 (mEq/L)    CO2 22  19 - 32 (mEq/L)    Glucose, Bld 105 (*) 70 - 99 (mg/dL)    BUN 38 (*) 6 - 23 (mg/dL)    Creat 1.61 (*) 0.96 - 1.10 (mg/dL)    Total Bilirubin 0.5  0.3 -  1.2 (mg/dL)    Alkaline Phosphatase 103  39 - 117 (U/L)    AST 21  0 - 37 (U/L)    ALT 14  0 - 35 (U/L)    Total Protein 7.2  6.0 - 8.3 (g/dL)    Albumin 4.3  3.5 - 5.2 (g/dL)    Calcium 04.5  8.4 - 10.5 (mg/dL)    No results found.  Review of Systems  All other systems reviewed and are negative.    There were no vitals taken for this visit. Physical Exam  Constitutional: She is oriented to person, place, and time. She appears well-developed. She appears distressed.       Has lost weight  HENT:  Head: Normocephalic and atraumatic.  Eyes: Pupils are equal, round, and reactive to light. No scleral icterus.  Neck: Normal range of motion. Neck supple. No tracheal deviation present. No thyromegaly present.  Cardiovascular: Normal rate, regular rhythm, normal heart sounds and intact distal pulses.   Respiratory: Effort normal and breath sounds normal. No respiratory distress. She exhibits no tenderness.  GI: Soft. Bowel sounds are normal. She exhibits no distension. There is tenderness (tender at open wound). There is no rebound and no guarding.  Musculoskeletal: Normal range of motion.  Lymphadenopathy:    She has no cervical adenopathy.  Neurological: She is alert and oriented to person, place, and time. Coordination normal.  Skin: Skin is warm and dry. She is not diaphoretic. There is pallor.  Psychiatric: She has a normal mood and affect. Her behavior is normal. Judgment and thought content normal.     Assessment/Plan IV Fluids Recheck Cr Monitor ileostomy output. CT abd/pelvis to eval for abscess. Restart home meds.    Javarri Segal 11/08/2011, 9:20 AM

## 2011-11-08 NOTE — Progress Notes (Signed)
BP 141/69  Pulse 95  Temp(Src) 97.5 F (36.4 C) (Oral)  Resp 18  SpO2 95% Feeling some better. Nausea improving. Headed to CT.  Marianna Fuss PA-C 11/08/2011 2:21 PM

## 2011-11-09 DIAGNOSIS — C20 Malignant neoplasm of rectum: Secondary | ICD-10-CM

## 2011-11-09 DIAGNOSIS — L03319 Cellulitis of trunk, unspecified: Secondary | ICD-10-CM

## 2011-11-09 DIAGNOSIS — L02219 Cutaneous abscess of trunk, unspecified: Secondary | ICD-10-CM

## 2011-11-09 DIAGNOSIS — E860 Dehydration: Secondary | ICD-10-CM

## 2011-11-09 LAB — CBC
HCT: 33.1 % — ABNORMAL LOW (ref 36.0–46.0)
HCT: 31.2 % — ABNORMAL LOW (ref 36.0–46.0)
Hemoglobin: 10.8 g/dL — ABNORMAL LOW (ref 12.0–15.0)
Hemoglobin: 10.3 g/dL — ABNORMAL LOW (ref 12.0–15.0)
MCH: 28.6 pg (ref 26.0–34.0)
MCH: 28.5 pg (ref 26.0–34.0)
MCHC: 32.6 g/dL (ref 30.0–36.0)
MCHC: 33 g/dL (ref 30.0–36.0)
MCV: 87.8 fL (ref 78.0–100.0)
MCV: 86.4 fL (ref 78.0–100.0)
Platelets: 165 10*3/uL (ref 150–400)
Platelets: 155 10*3/uL (ref 150–400)
RBC: 3.77 MIL/uL — ABNORMAL LOW (ref 3.87–5.11)
RBC: 3.61 MIL/uL — ABNORMAL LOW (ref 3.87–5.11)
RDW: 15.3 % (ref 11.5–15.5)
RDW: 15.1 % (ref 11.5–15.5)
WBC: 4.6 10*3/uL (ref 4.0–10.5)
WBC: 4.1 10*3/uL (ref 4.0–10.5)

## 2011-11-09 LAB — BASIC METABOLIC PANEL
BUN: 34 mg/dL — ABNORMAL HIGH (ref 6–23)
CO2: 22 mEq/L (ref 19–32)
Calcium: 10.2 mg/dL (ref 8.4–10.5)
Chloride: 97 mEq/L (ref 96–112)
Creatinine, Ser: 1.9 mg/dL — ABNORMAL HIGH (ref 0.50–1.10)
GFR calc Af Amer: 31 mL/min — ABNORMAL LOW (ref >90)
GFR calc non Af Amer: 26 mL/min — ABNORMAL LOW (ref >90)
Glucose, Bld: 111 mg/dL — ABNORMAL HIGH (ref 70–99)
Potassium: 4.5 mEq/L (ref 3.5–5.1)
Sodium: 130 mEq/L — ABNORMAL LOW (ref 135–145)

## 2011-11-09 LAB — MAGNESIUM: Magnesium: 1.8 mg/dL (ref 1.5–2.5)

## 2011-11-09 LAB — DIFFERENTIAL
Basophils Absolute: 0 10*3/uL (ref 0.0–0.1)
Basophils Relative: 1 % (ref 0–1)
Eosinophils Absolute: 0.1 10*3/uL (ref 0.0–0.7)
Eosinophils Relative: 2 % (ref 0–5)
Lymphocytes Relative: 17 % (ref 12–46)
Lymphs Abs: 0.7 10*3/uL (ref 0.7–4.0)
Monocytes Absolute: 0.5 10*3/uL (ref 0.1–1.0)
Monocytes Relative: 13 % — ABNORMAL HIGH (ref 3–12)
Neutro Abs: 2.8 10*3/uL (ref 1.7–7.7)
Neutrophils Relative %: 68 % (ref 43–77)

## 2011-11-09 LAB — PHOSPHORUS: Phosphorus: 5 mg/dL — ABNORMAL HIGH (ref 2.3–4.6)

## 2011-11-09 MED ORDER — AMOXICILLIN-POT CLAVULANATE 875-125 MG PO TABS: 1.0000 | ORAL_TABLET | Freq: Two times a day (BID) | ORAL | Status: DC

## 2011-11-09 MED ORDER — NYSTATIN 100000 UNIT/GM EX POWD
Freq: Every day | CUTANEOUS | Status: DC
  Administered 2011-11-11: 10:00:00 via TOPICAL
  Filled 2011-11-09: qty 15

## 2011-11-09 MED ORDER — TIOTROPIUM BROMIDE MONOHYDRATE 18 MCG IN CAPS
18.0000 ug | ORAL_CAPSULE | Freq: Every day | RESPIRATORY_TRACT | Status: DC
  Administered 2011-11-10 – 2011-11-11 (×2): 18 ug via RESPIRATORY_TRACT
  Filled 2011-11-09: qty 5

## 2011-11-09 MED ORDER — KCL IN DEXTROSE-NACL 10-5-0.45 MEQ/L-%-% IV SOLN
INTRAVENOUS | Status: DC
  Administered 2011-11-09: 18:00:00 via INTRAVENOUS
  Filled 2011-11-09 (×4): qty 1000

## 2011-11-09 MED ORDER — NYSTATIN 100000 UNIT/GM EX POWD: 1.0000 [IU] | Freq: Every day | CUTANEOUS | Status: DC

## 2011-11-09 NOTE — Progress Notes (Signed)
  Subjective: Feeling much better after rehydration.  Still getting some nausea.  Objective: Vital signs in last 24 hours: Temp:  [97.3 F (36.3 C)-98.7 F (37.1 C)] 98.6 F (37 C) (01/30 1435) Pulse Rate:  [70-90] 85  (01/30 1435) Resp:  [16-20] 18  (01/30 1435) BP: (91-126)/(55-85) 126/85 mmHg (01/30 1435) SpO2:  [93 %-96 %] 96 % (01/30 1435) Last BM Date: 11/08/11  Intake/Output from previous day: 01/29 0701 - 01/30 0700 In: 1965 [I.V.:1965] Out: 1270 [Urine:550; Stool:720] Intake/Output this shift: Total I/O In: -  Out: 600 [Urine:600]  General appearance: alert Chest wall: no tenderness GI: soft, non-tender; bowel sounds normal; no masses,  no organomegaly and wound with some drainage on dressing.  Lab Results:   Basename 11/09/11 0337 11/08/11 1141  WBC 4.6 5.6  HGB 10.8* 13.4  HCT 33.1* 39.7  PLT 165 207   BMET  Basename 11/09/11 0337 11/08/11 1141  NA 130* 130*  K 4.5 5.3*  CL 97 96  CO2 22 19  GLUCOSE 111* 100*  BUN 34* 35*  CREATININE 1.90* 1.93*  CALCIUM 10.2 11.1*   PT/INR No results found for this basename: LABPROT:2,INR:2 in the last 72 hours ABG No results found for this basename: PHART:2,PCO2:2,PO2:2,HCO3:2 in the last 72 hours  Studies/Results: Ct Abdomen Pelvis Wo Contrast  11/08/2011  *RADIOLOGY REPORT*  Clinical Data: Rectal carcinoma.  Postop from low anterior resection and ileostomy. Abdominal pain and leukocytosis. Renal insufficiency.  Evaluate for abscess.  CT ABDOMEN AND PELVIS WITHOUT CONTRAST  Technique:  Multidetector CT imaging of the abdomen and pelvis was performed following the standard protocol without intravenous contrast.  Comparison: 08/10/2011  Findings: Expected postoperative changes are seen from recent low anterior resection and right lower quadrant diverting ileostomy.  A small fluid collection containing several tiny internal gas bubbles is seen in the deep subcutaneous tissues of the anterior abdominal wall at the  level of the umbilicus.  This measures 1.9 x 4.2 cm and could represent a postoperative fluid collection or abscess.  No other abnormal fluid collections are seen within the abdomen or pelvis.  There is no evidence of free intraperitoneal fluid.  The abdominal parenchymal organs are normal in appearance on this noncontrast study.  Gallbladder is unremarkable.  No evidence of hydronephrosis.  No lymphadenopathy identified.  No evidence of dilated bowel loops.  A 6 mm nodular density is seen in the right lung base which has been stable compared to previous exams dating back to 05/11/2011.  IMPRESSION: 1.  1.9 x 4.2 cm fluid collection in the deep anterior abdominal wall subcutaneous tissues at the level of the umbilicus, which may represent a postoperative fluid collection or abscess. 2.  No intraperitoneal abscess or free fluid.  Original Report Authenticated By: Danae Orleans, [name]    Anti-infectives: Anti-infectives     Start     Dose/Rate Route Frequency Ordered Stop   11/08/11 1200   amoxicillin-clavulanate (AUGMENTIN) 875-125 MG per tablet 1 tablet        1 tablet Oral 2 times daily 11/08/11 1046     11/08/11 1130   amoxicillin-clavulanate (AUGMENTIN) 875-125 MG per tablet 1 tablet  Status:  Discontinued        1 tablet Oral Every 12 hours 11/08/11 1019 11/08/11 1047          Assessment/Plan: s/p  Continue augmentin PRN lomotil for watery ileostomy output. Decrease IVF Recheck Cr in AM    LOS: 1 day    [name] 11/09/2011

## 2011-11-09 NOTE — Progress Notes (Signed)
Patient ID: [name], female   DOB: [birth year], 66 y.o.   MRN: [id number]

## 2011-11-09 NOTE — Progress Notes (Signed)
INITIAL ADULT NUTRITION ASSESSMENT Date: 11/09/2011   Time: 3:33 PM Reason for Assessment: Nutrition risk   ASSESSMENT: Female 66 y.o.  Dx: Dehydration  Hx: Past Medical History  Diagnosis Date  . IBS (irritable bowel syndrome)   . Hyperlipidemia   . Allergic rhinitis   . History of pneumonia   . COPD (chronic obstructive pulmonary disease)   . Asthma   . Heart murmur     Aortic  . Lung nodule     RLL nodule-45mm stable 2006, April 2009, and June 2009  . Candidiasis of breast   . Anemia   . Hyperlipemia   . Obesity   . Uncontrolled hypertension as indication for native nephrectomy   . Heart valve problem     PER CARDIOLOGIST 05-03-2011  . Sleep difficulties   . Shortness of breath   . Hemorrhoids   . Cancer     rectal  . PONV (postoperative nausea and vomiting)     SEVERE N&V AFTER SURGERIES  . Sleep apnea     PT TOLD BORDERLINE-TRIED CPAP-DID NOT HELP-SHE DOES NOT USE CPAP  . Arthritis   . Ileostomy care 09/01/2011   Related Meds:  Scheduled Meds:   . amoxicillin-clavulanate  1 tablet Oral BID  . aspirin EC  81 mg Oral QHS  . dibucaine  1 application Rectal Daily  . diltiazem  120 mg Oral QHS  . enoxaparin  30 mg Subcutaneous Daily  . Fluticasone-Salmeterol  1 puff Inhalation BID  . montelukast  10 mg Oral QHS  . nystatin   Topical BID  . pantoprazole (PROTONIX) IV  40 mg Intravenous QHS   Continuous Infusions:   . lactated ringers 125 mL/hr at 11/09/11 1245   PRN Meds:.acetaminophen, acetaminophen, diphenhydrAMINE, diphenhydrAMINE, diphenoxylate-atropine, HYDROcodone-acetaminophen, ondansetron, prochlorperazine  Ht: 5\' 8"  (172.7 cm)  Wt: 198 lb (89.812 kg)  Ideal Wt: 63.6kg % Ideal Wt: 141  Usual Wt: 108.2kg % Usual Wt: 83  Body mass index is 30.11 kg/(m^2).  Food/Nutrition Related Hx: Pt with rectal CA s/p chemotherapy, radiation, and ileostomy. Pt reports difficulty eating with nausea ever since surgery in November 2012 with 40 pound  unintentional weight loss. Pt states this has worsened in the past 3 weeks. Pt reports ileostomy functioning well PTA and pt avoiding nuts/seeds that could clog opening. Pt states sometimes food does not taste as good as it used to. Pt reports using Biotene at home. Pt reports improvement in nausea today and actually ate a good portion of breakfast and lunch. Pt not interested in nutritional supplements.   CT of abdomen/pelvis on 1/29 showed: 1. 1.9 x 4.2 cm fluid collection in the deep anterior abdominal  wall subcutaneous tissues at the level of the umbilicus, which may  represent a postoperative fluid collection or abscess.  2. No intraperitoneal abscess or free fluid.  Labs:  CMP     Component Value Date/Time   NA 130* 11/09/2011 0337   NA 141 08/10/2011 1120   K 4.5 11/09/2011 0337   K 4.2 08/10/2011 1120   CL 97 11/09/2011 0337   CL 98 08/10/2011 1120   CO2 22 11/09/2011 0337   CO2 32 08/10/2011 1120   GLUCOSE 111* 11/09/2011 0337   GLUCOSE 92 08/10/2011 1120   BUN 34* 11/09/2011 0337   BUN 18 08/10/2011 1120   CREATININE 1.90* 11/09/2011 0337   CREATININE 2.15* 11/07/2011 1315   CALCIUM 10.2 11/09/2011 0337   CALCIUM 9.0 08/10/2011 1120   PROT 7.2 11/07/2011 1315  PROT 7.1 08/10/2011 1120   ALBUMIN 4.3 11/07/2011 1315   AST 21 11/07/2011 1315   AST 18 08/10/2011 1120   ALT 14 11/07/2011 1315   ALKPHOS 103 11/07/2011 1315   ALKPHOS 75 08/10/2011 1120   BILITOT 0.5 11/07/2011 1315   BILITOT 0.60 08/10/2011 1120   GFRNONAA 26* 11/09/2011 0337   GFRAA 31* 11/09/2011 0337    Intake/Output Summary (Last 24 hours) at 11/09/11 1537 Last data filed at 11/09/11 1400  Gross per 24 hour  Intake   1965 ml  Output   1600 ml  Net    365 ml   Illeostomy output - total from past 2 previous shifts    Diet Order: General   IVF:    lactated ringers Last Rate: 125 mL/hr at 11/09/11 1245    Estimated Nutritional Needs:   Kcal:1900-2200 Protein:75-95g Fluid:1.9-2.2L  NUTRITION  DIAGNOSIS: -Inadequate oral intake (NI-2.1).  Status: Ongoing -Pt meets criteria for severe PCM of chronic illness AEB 16.8% weight loss and <75% energy intake in the past 3 months per pt report  RELATED TO: poor appetite, nausea  AS EVIDENCE BY: pt statement  MONITORING/EVALUATION(Goals): Pt to consume >75% of meals.   EDUCATION NEEDS: -Education needs addressed - reviewed high calorie/protein nutrition therapy and importance of small frequent bland meals for nausea - handout provided on this information and on nutrition therapy for taste changes from chemotherapy  INTERVENTION: Encouraged gradual increased intake as nausea improves. Will monitor.   Dietitian #: [phone]  DOCUMENTATION CODES Per approved criteria  -Severe malnutrition in the context of chronic illness -Obesity unspecified    Marshall Cork 11/09/2011, 3:33 PM

## 2011-11-09 NOTE — Consult Note (Signed)
[name] CONSULTATION NOTE  Reason for Consult:  uT3 N1 low-lying invasive rectal adenocarcinoma   ZOX:WRUEAV S [name] is an 66 y.o. femaleMs. [name] is a 66 year old woman with a uT3 N1 low-lying invasive rectal adenocarcinoma diagnosed in August 2012. She is status post concurrent Xeloda with radiation therapy from 05/30/2011 through 07/07/2011. 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 She is status post I and D of the surgical wound and is on antibiotics. She was due to begin Xeloda dosed at 1500 mg p.o. B.i.d. for 14 days, followed by a week's rest. This was placed on hold due to ongoing post surgical events.    She was seen by Dr. Donell Beers on 11/07/11 with complaints of fatigue and lethargy.  In addition, purulence was noted from the wound, and her ileostomy had watery output. She has had intermittent nausea and weight loss, but no abdominal pain, fever or chills. She was admitted for management of her symptoms.   A CT of abdomen and pelvis shows shows  a 1.9 x 4.2 cm fluid collection in the deep anterior abdominal wall subcutaneous tissues at the level of the umbilicus, which may represent a postoperative fluid collection or abscess.  No intraperitoneal abscess or free fluid.  See results below.  Of note, she receive Feraheme on 09/26/2011 for anemia. Hemoglobin is currently stable.   We were kindly  informed of the patient's admission.    PMH: Past Medical History  Diagnosis Date  . IBS (irritable bowel syndrome)   . Hyperlipidemia   . Allergic rhinitis   . History of pneumonia   . COPD (chronic obstructive pulmonary disease)   . Asthma   . Heart murmur     Aortic  . Lung nodule     RLL nodule-22mm stable 2006, April 2009, and June 2009  . Candidiasis of breast   . Anemia   . Hyperlipemia   . Obesity   . Uncontrolled hypertension as  indication for native nephrectomy   . Heart valve problem     PER CARDIOLOGIST 05-03-2011  . Sleep difficulties   . Shortness of breath   . Hemorrhoids   . Cancer     rectal  . PONV (postoperative nausea and vomiting)     SEVERE N&V AFTER SURGERIES  . Sleep apnea     PT TOLD BORDERLINE-TRIED CPAP-DID NOT HELP-SHE DOES NOT USE CPAP  . Arthritis   . Ileostomy care 09/01/2011    Surgeries: Past Surgical History  Procedure Date  . Foot surgery     hammer toe  . Hand surgery     carpal tunnel  . Back surgery     2006 SPACER---2007 LUMBAR FUSION  . Tonsillectomy   . Colon resection 08/25/2011    Procedure: COLON RESECTION LAPAROSCOPIC;  Surgeon: [name], MD;  Location: [facility];  Service: General;  Laterality: N/A;  Laparoscopic Assisted Low Anterior Resection Diverting Ostomy and onQ pain pump  . Ileostomy     Allergies:  Allergies  Allergen Reactions  . Codeine Nausea Only  . Prednisone Hives    In different places all over the body.  . Sulfonamide Derivatives Nausea Only    Medications:  Prior to Admission:  Prescriptions prior to admission  Medication Sig Dispense Refill  . ADVAIR DISKUS 100-50 MCG/DOSE AEPB INHALE 1 DOSE BY MOUTH TWICE A DAY  60 each  5  .  amoxicillin-clavulanate (AUGMENTIN) 875-125 MG per tablet Take 1 tablet by mouth 2 (two) times daily. 14 day course started 11/07/11      . aspirin 81 MG tablet Take 81 mg by mouth at bedtime.       . Calcium Carbonate-Vitamin D (CALCIUM 600 + D PO) Take 600 mg by mouth at bedtime.       . capecitabine (XELODA) 500 MG tablet Take 1,500 mg by mouth. Take 3 tabs po  BID  X  14  Days  Followed  By  7  Days rest. Gave rx  To  Ebony,  Care management. Has  5  Refills.       Marland Kitchen diltiazem (CARDIZEM CD) 120 MG 24 hr capsule Take 120 mg by mouth at bedtime.       . diphenoxylate-atropine (LOMOTIL) 2.5-0.025 MG per tablet Take 2 tablets by mouth 4 (four) times daily as needed for diarrhea or loose stools.  90 tablet  3  .  losartan (COZAAR) 50 MG tablet daily.      Marland Kitchen lovastatin (MEVACOR) 40 MG tablet take 1 tablet by mouth once daily  30 tablet  1  . montelukast (SINGULAIR) 10 MG tablet Take 10 mg by mouth at bedtime.       Marland Kitchen nystatin (MYCOSTATIN) powder Apply 1 Units topically daily. Applies to skin folds or around stoma as needed for fungal infection.      . pantoprazole (PROTONIX) 40 MG tablet Take 40 mg by mouth daily.       . prochlorperazine (COMPAZINE) 10 MG tablet Take 10 mg by mouth every 6 (six) hours as needed. NAUSEA      . SPIRIVA HANDIHALER 18 MCG inhalation capsule inhale the contents of one capsule inTO the handihaler daily  30 capsule  1  . DISCONTD: amoxicillin-clavulanate (AUGMENTIN) 875-125 MG per tablet Take 1 tablet by mouth 2 (two) times daily.  28 tablet  0  . DISCONTD: nystatin (MYCOSTATIN) powder Apply to affected area as needed (around stoma)  15 g  0    [id number], acetaminophen, diphenhydrAMINE, diphenhydrAMINE, diphenoxylate-atropine, HYDROcodone-acetaminophen, ondansetron, prochlorperazine  ROS: Constitutional: Positive for weight loss. Negative for fever, chills and malaise/fatigue.  Eyes: Negative for blurred vision and double vision.  Respiratory: Negative for cough, hemoptysis and shortness of breath.  Cardiovascular: Negative for chest pain. GI: No nausea, vomiting, diarrhea, constipation. No change in bowel caliber. No  Melena or hematochezia.  GU: No blood in urine. No loss of urinary control. Skin: Negative for itching. No rash. No petechia. No bruising Neurological: No headaches. No motor or sensory deficits.  Family History:  Family History  Problem Relation Age of Onset  . Lung cancer Father   . Cancer Father     lung  . Diabetes    . Heart disease    . Heart disease Mother   . Diabetes Mother     Social History:  reports that she quit smoking about 36 years ago. Her smoking use included Cigarettes. She has a 15 pack-year smoking history. She has  never used smokeless tobacco. She reports that she drinks alcohol. She reports that she does not use illicit drugs.  Physical Exam  In no acute distress A. and O. x3 General well-developed and well-nourished  HEENT: Normocephalic, atraumatic, PERRLA. Oral cavity without thrush or lesions. Neck supple. no thyromegaly, no cervical or supraclavicular adenopathy  Lungs clear bilaterally . No wheezing, rhonchi or rales. No axillary masses. Breasts: not examined. Cardiac regular rate and rhythm normal  S1-S2, no murmur , rubs or gallops Abdomen soft nontender , bowel sounds x4. No HSM GU/rectal: deferred. Extremities no clubbing cyanosis or edema. No bruising or petechial rash     Labs:  CBC   Lab 11/09/11 0337 11/08/11 1141 11/07/11 1315  WBC 4.6 5.6 5.9  HGB 10.8* 13.4 12.8  HCT 33.1* 39.7 39.7  PLT 165 207 235  MCV 87.8 87.4 90.2  MCH 28.6 29.5 29.1  MCHC 32.6 33.8 32.2  RDW 15.3 15.1 15.7*  LYMPHSABS -- -- 0.6*  MONOABS -- -- 0.6  EOSABS -- -- 0.1  BASOSABS -- -- 0.0  BANDABS -- -- --    CMP    Lab 11/09/11 0337 11/08/11 1141 11/07/11 1315  NA 130* 130* 135  K 4.5 5.3* 5.1  CL 97 96 98  CO2 22 19 22   GLUCOSE 111* 100* 105*  BUN 34* 35* 38*  CREATININE 1.90* 1.93* 2.15*  CALCIUM 10.2 11.1* 10.4  MG 1.8 -- --  AST -- -- 21  ALT -- -- 14  ALKPHOS -- -- 103  BILITOT -- -- 0.5    Imaging Studies: Ct Abdomen Pelvis Wo Contrast  11/08/2011  -   CT ABDOMEN AND PELVIS WITHOUT CONTRAST   Findings: Expected postoperative changes are seen from recent low anterior resection and right lower quadrant diverting ileostomy.  A small fluid collection containing several tiny internal gas bubbles is seen in the deep subcutaneous tissues of the anterior abdominal wall at the level of the umbilicus.  This measures 1.9 x 4.2 cm and could represent a postoperative fluid collection or abscess.  No other abnormal fluid collections are seen within the abdomen or pelvis.  There is no  evidence of free intraperitoneal fluid.  The abdominal parenchymal organs are normal in appearance on this noncontrast study.  Gallbladder is unremarkable.  No evidence of hydronephrosis.  No lymphadenopathy identified.  No evidence of dilated bowel loops.  A 6 mm nodular density is seen in the right lung base which has been stable compared to previous exams dating back to 05/11/2011.  IMPRESSION: 1.  1.9 x 4.2 cm fluid collection in the deep anterior abdominal wall subcutaneous tissues at the level of the umbilicus, which may represent a postoperative fluid collection or abscess. 2.  No intraperitoneal abscess or free fluid.     A/P: 66 y.o. female  with a history of  uT3 N1 low-lying invasive rectal adenocarcinoma.  S/P resection with ileostomy.  Admitted with dehydration and abscess.   Pt will be scheduled to follow up as out patient and begin Xeloda when she has recovered.   [facility] E 11/09/2011 12:19 PM   I have seen and examined the patient and agree with above.  Laurice Record., [name]

## 2011-11-09 NOTE — Progress Notes (Signed)
Agree with above 

## 2011-11-10 ENCOUNTER — Ambulatory Visit: Payer: Medicare Other | Admitting: Hematology and Oncology

## 2011-11-10 ENCOUNTER — Encounter (HOSPITAL_COMMUNITY): Payer: Medicare Other

## 2011-11-10 LAB — BASIC METABOLIC PANEL
BUN: 21 mg/dL (ref 6–23)
CO2: 23 mEq/L (ref 19–32)
Calcium: 10 mg/dL (ref 8.4–10.5)
Chloride: 100 mEq/L (ref 96–112)
Creatinine, Ser: 1.41 mg/dL — ABNORMAL HIGH (ref 0.50–1.10)
GFR calc Af Amer: 44 mL/min — ABNORMAL LOW (ref >90)
GFR calc non Af Amer: 38 mL/min — ABNORMAL LOW (ref >90)
Glucose, Bld: 124 mg/dL — ABNORMAL HIGH (ref 70–99)
Potassium: 4.2 mEq/L (ref 3.5–5.1)
Sodium: 133 mEq/L — ABNORMAL LOW (ref 135–145)

## 2011-11-10 MED ORDER — PANTOPRAZOLE SODIUM 40 MG PO TBEC
40.0000 mg | DELAYED_RELEASE_TABLET | Freq: Every day | ORAL | Status: DC
  Administered 2011-11-10 – 2011-11-11 (×2): 40 mg via ORAL
  Filled 2011-11-10 (×2): qty 1

## 2011-11-10 MED ORDER — ONDANSETRON HCL 8 MG PO TABS: 8.0000 mg | ORAL_TABLET | Freq: Three times a day (TID) | ORAL | Status: AC | PRN

## 2011-11-10 NOTE — Discharge Summary (Signed)
Physician Discharge Summary  Patient ID: [name] MRN: [id number] DOB/AGE: 07/01/1946 66 y.o.  Admit date: 11/08/2011 Discharge date: 11/10/2011  Admission Diagnoses: Dehydration Wound infection High ileostomy output  Patient Active Problem List  Diagnoses  . OTHER CANDIDIASIS OF OTHER SPECIFIED SITES  . HYPERLIPIDEMIA  . Morbid obesity  . MERALGIA PARESTHETICA  . UNSPECIFIED ESSENTIAL HYPERTENSION  . ACUTE BRONCHITIS  . ALLERGIC RHINITIS  . VIRAL PNEUMONIA  . PNEUMONIA, LEFT  . CHRONIC OBSTRUCTIVE PULMONARY DISEASE, ACUTE EXACERBATION  . ASTHMA  . COPD  . Other Diseases of Lung, not Elsewhere Classified  . GERD  . CONTACT DERMATITIS  . PRESSURE ULCER UNSPECIFIED SITE  . BACK PAIN  . CARDIAC MURMUR, AORTIC  . SHORTNESS OF BREATH (SOB)  . COUGH  . PNEUMONIA, HX OF  . Iron deficiency anemia  . Rectal cancer, midrectum ypT3ypN0 (0/7 LN), s/p neoadj chemoXRT, lap LAR with diverting loop ileostomy  . Ileostomy care  . Unspecified deficiency anemia  . Candidiasis     Discharge Diagnoses:  Same  Discharged Condition: good  Hospital Course:  Pt was admitted from home due to elevated creatinine, nausea, and high ileostomy output.  She was rehydrated, and her ostomy output firmed up as her diet was advanced.  She was able to tolerate more food with antiemetics.  Her creatinine dropped from 2 back to 1.2.  Her wound became less painful.   At the time of D/C, she was walking well, pain controlled, ileostomy output was controlled, urinating well, no dizziness, and pain controlled.  Consults: None  Significant Diagnostic Studies: labs: Creatinine as above.    Treatments: IV hydration, antibiotics: augmentin and antiemetics    Discharge Exam: Blood pressure 106/61, pulse 72, temperature 98.2 F (36.8 C), temperature source Oral, resp. rate 18, height 5\' 8"  (1.727 m), weight 198 lb (89.812 kg), SpO2 96.00%.  General: Pt awake/alert/oriented x4 in no major acute  distress Eyes: PERRL, normal EOM. Neuro: CN II-XII intact w/o focal sensory/motor deficits. Lymph: No head/neck/groin lymphadenopathy Psych:  No delerium/psychosis/paranoia.  Smiling, alert HEENT: Normocephalic, Mucus membranes moist.  No thrush Neck: Supple, No tracheal deviation Chest: No pain w good excursion CV:  Pulses intact.  Regular rhythm Abdomen: soft, nontender/nondistended.  No incarcerated hernias.  Wound with scant drainage - wicks in place.  Mild rash - giving fluconazole x 1 now.  Ileostomy +gas/elleuent (thick). Ext:  SCDs BLE.  No mjr edema.  No cyanosis Skin: No petechiae / purpurae   Disposition: Home-Health Care Svc  Discharge Orders    Future Appointments: Provider: Department: Dept Phone: Center:   02/10/2012 10:00 AM Jonna Coup, MD [facility] [phone] None     Medication List  As of 11/10/2011  1:45 PM   ASK your doctor about these medications         ADVAIR DISKUS 100-50 MCG/DOSE Aepb   Generic drug: Fluticasone-Salmeterol   INHALE 1 DOSE BY MOUTH TWICE A DAY      amoxicillin-clavulanate 875-125 MG per tablet   Commonly known as: AUGMENTIN   Take 1 tablet by mouth 2 (two) times daily. 14 day course started 11/07/11      aspirin 81 MG tablet   Take 81 mg by mouth at bedtime.      CALCIUM 600 + D PO   Take 600 mg by mouth at bedtime.      capecitabine 500 MG tablet   Commonly known as: XELODA   Take 1,500 mg by mouth. Take 3 tabs po  BID  X  14  Days  Followed  By  7  Days rest.  Gave rx  To  Ebony,  Care management.  Has  5  Refills.      diltiazem 120 MG 24 hr capsule   Commonly known as: CARDIZEM CD   Take 120 mg by mouth at bedtime.      diphenoxylate-atropine 2.5-0.025 MG per tablet   Commonly known as: LOMOTIL   Take 2 tablets by mouth 4 (four) times daily as needed for diarrhea or loose stools.      losartan 50 MG tablet   Commonly known as: COZAAR   daily.      lovastatin 40 MG tablet   Commonly known as: MEVACOR     take 1 tablet by mouth once daily      montelukast 10 MG tablet   Commonly known as: SINGULAIR   Take 10 mg by mouth at bedtime.      nystatin powder   Commonly known as: MYCOSTATIN   Apply 1 Units topically daily. Applies to skin folds or around stoma as needed for fungal infection.      oxyCODONE-acetaminophen 5-325 MG per tablet   Commonly known as: PERCOCET      pantoprazole 40 MG tablet   Commonly known as: PROTONIX   Take 40 mg by mouth daily.      prochlorperazine 10 MG tablet   Commonly known as: COMPAZINE   Take 10 mg by mouth every 6 (six) hours as needed. NAUSEA      SPIRIVA HANDIHALER 18 MCG inhalation capsule   Generic drug: tiotropium   inhale the contents of one capsule inTO the handihaler daily           Follow-up Information    Follow up with [facility], MD. Schedule an appointment as soon as possible for a visit in 1 week. (the week of 11/21/2011)    Contact information:   [facility] [address] [place] [address] [phone]          Signed: [name] 11/10/2011, 1:45 PM

## 2011-11-10 NOTE — Progress Notes (Signed)
The patient is receiving Protonix by the intravenous route.  Based on criteria approved by the Pharmacy and Therapeutics Committee and the Medical Executive Committee, the medication is being converted to the equivalent oral dose form.  These criteria include: -No Active GI bleeding -Able to tolerate diet of full liquids (or better) or tube feeding OR able to tolerate other medications by the oral or enteral route  If you have any questions about this conversion, please contact the Pharmacy Department (ext [phone]).  Thank you.  Hessie Knows, PharmD, BCPS 11/10/2011 12:31 PM [phone]

## 2011-11-10 NOTE — Progress Notes (Signed)
CARE MANAGEMENT NOTE 11/10/2011  Patient:  [name], [name]   Account Number:  [id number]  Date Initiated:  11/10/2011  Documentation initiated by:  Charles Niese  Subjective/Objective Assessment:   66 yo female admitted 11/08/11 with dehydration, rectal CA     Action/Plan:   D/C when medically stable   Anticipated DC Date:  11/13/2011   Anticipated DC Plan:  HOME W HOME HEALTH SERVICES      DC Planning Services  CM consult      [facility] Choice  HOME HEALTH   Choice offered to / List presented to:  C-1 Patient        HH arranged  HH-1 RN      [facility] agency  [facility]   Status of service:  In process, will continue to follow  Comments:  11/10/11, Kathi Der RNC-MNN, BSN, [phone], CM received referral.  CM spoke with pt.  Pt states that she is currently active with [facility] and wants to continue their services.  Pt states that her husband will be able to assist her at home.  Darl Pikes at [facility] contacted for  resumption of services and confirmation of services received.

## 2011-11-10 NOTE — Progress Notes (Signed)
Patient ID: [name], female   DOB: [birth year], 66 y.o.   MRN: [id number]    Subjective: Still occasional nausea.    Objective: Vital signs in last 24 hours: Temp:  [97.7 F (36.5 C)-98.6 F (37 C)] 97.7 F (36.5 C) (01/31 0609) Pulse Rate:  [66-85] 66  (01/31 0609) Resp:  [16-18] 18  (01/31 0609) BP: (96-126)/(56-85) 96/60 mmHg (01/31 0609) SpO2:  [94 %-96 %] 94 % (01/31 1117) Last BM Date: 11/09/11  Intake/Output from previous day: 01/30 0701 - 01/31 0700 In: 791.7 [P.O.:220; I.V.:571.7] Out: 2425 [Urine:1950; Stool:475] Intake/Output this shift: Total I/O In: -  Out: 350 [Urine:200; Stool:150]  General appearance: alert Chest wall: no tenderness GI: soft, non-tender; bowel sounds normal; no masses,  no organomegaly and wound with some drainage on dressing.  Lab Results:   Basename 11/09/11 1836 11/09/11 0337  WBC 4.1 4.6  HGB 10.3* 10.8*  HCT 31.2* 33.1*  PLT 155 165   BMET  Basename 11/10/11 0315 11/09/11 0337  NA 133* 130*  K 4.2 4.5  CL 100 97  CO2 23 22  GLUCOSE 124* 111*  BUN 21 34*  CREATININE 1.41* 1.90*  CALCIUM 10.0 10.2   PT/INR No results found for this basename: LABPROT:2,INR:2 in the last 72 hours ABG No results found for this basename: PHART:2,PCO2:2,PO2:2,HCO3:2 in the last 72 hours  Studies/Results: Ct Abdomen Pelvis Wo Contrast  11/08/2011  *RADIOLOGY REPORT*  Clinical Data: Rectal carcinoma.  Postop from low anterior resection and ileostomy. Abdominal pain and leukocytosis. Renal insufficiency.  Evaluate for abscess.  CT ABDOMEN AND PELVIS WITHOUT CONTRAST  Technique:  Multidetector CT imaging of the abdomen and pelvis was performed following the standard protocol without intravenous contrast.  Comparison: 08/10/2011  Findings: Expected postoperative changes are seen from recent low anterior resection and right lower quadrant diverting ileostomy.  A small fluid collection containing several tiny internal gas bubbles is seen in the  deep subcutaneous tissues of the anterior abdominal wall at the level of the umbilicus.  This measures 1.9 x 4.2 cm and could represent a postoperative fluid collection or abscess.  No other abnormal fluid collections are seen within the abdomen or pelvis.  There is no evidence of free intraperitoneal fluid.  The abdominal parenchymal organs are normal in appearance on this noncontrast study.  Gallbladder is unremarkable.  No evidence of hydronephrosis.  No lymphadenopathy identified.  No evidence of dilated bowel loops.  A 6 mm nodular density is seen in the right lung base which has been stable compared to previous exams dating back to 05/11/2011.  IMPRESSION: 1.  1.9 x 4.2 cm fluid collection in the deep anterior abdominal wall subcutaneous tissues at the level of the umbilicus, which may represent a postoperative fluid collection or abscess. 2.  No intraperitoneal abscess or free fluid.  Original Report Authenticated By: Danae Orleans, [name]    Anti-infectives: Anti-infectives     Start     Dose/Rate Route Frequency Ordered Stop   11/09/11 2200   amoxicillin-clavulanate (AUGMENTIN) 875-125 MG per tablet 1 tablet  Status:  Discontinued        1 tablet Oral 2 times daily 11/09/11 1643 11/09/11 1648   11/08/11 1200   amoxicillin-clavulanate (AUGMENTIN) 875-125 MG per tablet 1 tablet        1 tablet Oral 2 times daily 11/08/11 1046     11/08/11 1130   amoxicillin-clavulanate (AUGMENTIN) 875-125 MG per tablet 1 tablet  Status:  Discontinued  1 tablet Oral Every 12 hours 11/08/11 1019 11/08/11 1047          Assessment/Plan: s/p  Continue augmentin PRN lomotil for watery ileostomy output. Saline lock IVF Recheck Cr in AM Possibly home tomorrow.      LOS: 2 days    [facility] 11/10/2011

## 2011-11-11 LAB — BASIC METABOLIC PANEL
BUN: 15 mg/dL (ref 6–23)
CO2: 21 mEq/L (ref 19–32)
Calcium: 9.7 mg/dL (ref 8.4–10.5)
Chloride: 101 mEq/L (ref 96–112)
Creatinine, Ser: 1.24 mg/dL — ABNORMAL HIGH (ref 0.50–1.10)
GFR calc Af Amer: 51 mL/min — ABNORMAL LOW (ref >90)
GFR calc non Af Amer: 44 mL/min — ABNORMAL LOW (ref >90)
Glucose, Bld: 109 mg/dL — ABNORMAL HIGH (ref 70–99)
Potassium: 4.3 mEq/L (ref 3.5–5.1)
Sodium: 132 mEq/L — ABNORMAL LOW (ref 135–145)

## 2011-11-11 MED ORDER — DIPHENOXYLATE-ATROPINE 2.5-0.025 MG PO TABS: 1.0000 | ORAL_TABLET | Freq: Two times a day (BID) | ORAL | Status: AC

## 2011-11-11 MED ORDER — FLUCONAZOLE 200 MG PO TABS
200.0000 mg | ORAL_TABLET | Freq: Every day | ORAL | Status: DC
  Administered 2011-11-11: 200 mg via ORAL
  Filled 2011-11-11: qty 1

## 2011-11-11 MED ORDER — DIPHENOXYLATE-ATROPINE 2.5-0.025 MG PO TABS
1.0000 | ORAL_TABLET | Freq: Two times a day (BID) | ORAL | Status: DC
  Administered 2011-11-11: 1 via ORAL
  Filled 2011-11-11: qty 1

## 2011-11-11 NOTE — Progress Notes (Signed)
DC to home. To car by wc. No change from am assessment.[name]  

## 2011-11-14 ENCOUNTER — Telehealth (INDEPENDENT_AMBULATORY_CARE_PROVIDER_SITE_OTHER): Payer: Self-pay

## 2011-11-14 NOTE — Telephone Encounter (Signed)
Pt was contacted today to reschedule her no show appointment on 11/11/11.  She has an open abdominal wound, but has no showed several appointments since her surgery.  She reports that her wound is "not looking good."  I explained that it was difficult for [place] to help her if she was not able to get to her appointments due to transportation problems.  She had a wound vac, but was d/c by [facility] because they could not contact her.  We have not seen her here in our office since that time.  She asked if there was another surgical practice near [place] that we could refer her to.  I told her I would discuss this with Dr. Abbey Chatters and we would call her back.

## 2011-11-15 ENCOUNTER — Encounter (HOSPITAL_COMMUNITY): Payer: Medicare Other

## 2011-11-17 ENCOUNTER — Encounter (HOSPITAL_COMMUNITY): Payer: Medicare Other

## 2011-11-18 NOTE — Telephone Encounter (Signed)
This encounter was created in error - please disregard.

## 2011-11-18 NOTE — Telephone Encounter (Signed)
Addended by: Milas Hock on: 11/18/2011 03:07 PM   Modules accepted: Level of Service, SmartSet

## 2011-11-22 ENCOUNTER — Encounter (HOSPITAL_COMMUNITY): Payer: Medicare Other

## 2011-11-24 ENCOUNTER — Encounter (HOSPITAL_COMMUNITY): Payer: Medicare Other

## 2011-11-28 ENCOUNTER — Ambulatory Visit (INDEPENDENT_AMBULATORY_CARE_PROVIDER_SITE_OTHER): Payer: Medicare Other | Admitting: General Surgery

## 2011-11-28 ENCOUNTER — Encounter (INDEPENDENT_AMBULATORY_CARE_PROVIDER_SITE_OTHER): Payer: Self-pay | Admitting: General Surgery

## 2011-11-28 VITALS — BP 116/58 | HR 66 | Temp 97.8°F | Resp 16 | Ht 68.0 in | Wt 205.2 lb

## 2011-11-28 DIAGNOSIS — C20 Malignant neoplasm of rectum: Secondary | ICD-10-CM

## 2011-11-28 NOTE — Assessment & Plan Note (Signed)
Continue wound packing.    Should be ready for chemo in 3 weeks.  Follow up with me in 3 months. Need to finish chemo and get colonoscopy before ileostomy takedown.

## 2011-11-28 NOTE — Progress Notes (Signed)
Subjective:     Patient ID: [name], female   DOB: 1946/10/08, 66 y.o.   MRN: [id number]  HPI Pt doing much better.  Energy level improved.  No drainage from abdominal wound.  Finished antibiotics.    Review of Systems Otherwise negative.      Objective:   Physical Exam Abdomen soft, non tender, non distended.   Wound soft, non tender.  No drainage.  Wick replaced    Assessment/Plan:     Rectal cancer, midrectum ypT3ypN0 (0/7 LN), s/p neoadj chemoXRT, lap LAR with diverting loop ileostomy Continue wound packing.    Should be ready for chemo in 3 weeks.  Follow up with me in 3 months. Need to finish chemo and get colonoscopy before ileostomy takedown.

## 2011-11-28 NOTE — Patient Instructions (Signed)
OK to start chemo in around 3 weeks.    Follow up with me in 3 months.  May stop packing when unable to get wick in.

## 2011-11-29 ENCOUNTER — Telehealth: Payer: Self-pay | Admitting: Nurse Practitioner

## 2011-11-29 ENCOUNTER — Other Ambulatory Visit: Payer: Self-pay | Admitting: Hematology and Oncology

## 2011-11-29 ENCOUNTER — Encounter (HOSPITAL_COMMUNITY): Payer: Medicare Other

## 2011-11-29 DIAGNOSIS — C20 Malignant neoplasm of rectum: Secondary | ICD-10-CM

## 2011-11-29 NOTE — Telephone Encounter (Signed)
Pt called and stated Dr. Donell Beers told her she could start chemotherapy in 3 weeks.  Pt states she has medication at home, just needs start date.  Information to Dr. Dalene Carrow.

## 2011-11-29 NOTE — Telephone Encounter (Signed)
Spoke with patient.  Gave information re: lab/MD appointment on 12/16/10.  Lab at 0830 and see MD at 0900.

## 2011-12-01 ENCOUNTER — Encounter (HOSPITAL_COMMUNITY): Payer: Medicare Other

## 2011-12-06 ENCOUNTER — Encounter (HOSPITAL_COMMUNITY): Payer: Medicare Other

## 2011-12-08 ENCOUNTER — Encounter (HOSPITAL_COMMUNITY): Payer: Medicare Other

## 2011-12-13 ENCOUNTER — Encounter (HOSPITAL_COMMUNITY): Payer: Medicare Other

## 2011-12-14 ENCOUNTER — Other Ambulatory Visit: Payer: Self-pay | Admitting: Internal Medicine

## 2011-12-14 DIAGNOSIS — Z1231 Encounter for screening mammogram for malignant neoplasm of breast: Secondary | ICD-10-CM

## 2011-12-15 ENCOUNTER — Encounter (HOSPITAL_COMMUNITY): Payer: Medicare Other

## 2011-12-16 ENCOUNTER — Other Ambulatory Visit (HOSPITAL_BASED_OUTPATIENT_CLINIC_OR_DEPARTMENT_OTHER): Payer: Medicare Other | Admitting: Lab

## 2011-12-16 ENCOUNTER — Telehealth: Payer: Self-pay | Admitting: Hematology and Oncology

## 2011-12-16 ENCOUNTER — Encounter: Payer: Self-pay | Admitting: Hematology and Oncology

## 2011-12-16 ENCOUNTER — Ambulatory Visit (HOSPITAL_BASED_OUTPATIENT_CLINIC_OR_DEPARTMENT_OTHER): Payer: Medicare Other | Admitting: Hematology and Oncology

## 2011-12-16 VITALS — BP 109/64 | HR 77 | Temp 96.9°F | Ht 68.0 in | Wt 206.7 lb

## 2011-12-16 DIAGNOSIS — C20 Malignant neoplasm of rectum: Secondary | ICD-10-CM

## 2011-12-16 DIAGNOSIS — D649 Anemia, unspecified: Secondary | ICD-10-CM

## 2011-12-16 LAB — CBC WITH DIFFERENTIAL/PLATELET
BASO%: 0.9 % (ref 0.0–2.0)
Basophils Absolute: 0 10*3/uL (ref 0.0–0.1)
EOS%: 3.5 % (ref 0.0–7.0)
Eosinophils Absolute: 0.2 10*3/uL (ref 0.0–0.5)
HCT: 36.8 % (ref 34.8–46.6)
HGB: 12.2 g/dL (ref 11.6–15.9)
LYMPH%: 12.5 % — ABNORMAL LOW (ref 14.0–49.7)
MCH: 30.3 pg (ref 25.1–34.0)
MCHC: 33.2 g/dL (ref 31.5–36.0)
MCV: 91.3 fL (ref 79.5–101.0)
MONO#: 0.4 10*3/uL (ref 0.1–0.9)
MONO%: 10.4 % (ref 0.0–14.0)
NEUT#: 3.1 10*3/uL (ref 1.5–6.5)
NEUT%: 72.7 % (ref 38.4–76.8)
Platelets: 177 10*3/uL (ref 145–400)
RBC: 4.03 10*6/uL (ref 3.70–5.45)
RDW: 16.8 % — ABNORMAL HIGH (ref 11.2–14.5)
WBC: 4.2 10*3/uL (ref 3.9–10.3)
lymph#: 0.5 10*3/uL — ABNORMAL LOW (ref 0.9–3.3)
nRBC: 0 % (ref 0–0)

## 2011-12-16 LAB — COMPREHENSIVE METABOLIC PANEL
ALT: 11 U/L (ref 0–35)
AST: 16 U/L (ref 0–37)
Albumin: 4 g/dL (ref 3.5–5.2)
Alkaline Phosphatase: 76 U/L (ref 39–117)
BUN: 22 mg/dL (ref 6–23)
CO2: 25 mEq/L (ref 19–32)
Calcium: 9.5 mg/dL (ref 8.4–10.5)
Chloride: 106 mEq/L (ref 96–112)
Creatinine, Ser: 1.32 mg/dL — ABNORMAL HIGH (ref 0.50–1.10)
Glucose, Bld: 84 mg/dL (ref 70–99)
Potassium: 4.7 mEq/L (ref 3.5–5.3)
Sodium: 138 mEq/L (ref 135–145)
Total Bilirubin: 0.5 mg/dL (ref 0.3–1.2)
Total Protein: 6.4 g/dL (ref 6.0–8.3)

## 2011-12-16 NOTE — Progress Notes (Signed)
CC:   Diane Gess. Norins, MD [name], MD [name], [name], [name] [name], MD [name], MD  IDENTIFYING STATEMENT:  The patient is a 66 year old woman with rectal cancer who presents for followup.  INTERVAL HISTORY:  The patient has recovered from surgical wound infection.  She feels more energetic.  She is keen to get started.  MEDICATIONS:  Reviewed and updated.  ALLERGIES:  Sulfa, codeine and prednisone.  PAST MEDICAL HISTORY/FAMILY HISTORY/SOCIAL HISTORY:  Unchanged.  REVIEW OF SYSTEMS:  Ten point review of systems negative.  PHYSICAL EXAM:  The patient alert and oriented x3.  Vitals:  Pulse 77, blood pressure 109/64, temperature 96.9, respirations 20, weight 206 pounds.  HEENT:  Head is atraumatic, normocephalic.  Sclerae anicteric. Mouth moist.  Neck:  Supple.  Chest:  Clear.  CVS:  Unremarkable. Abdomen:  Soft, nontender.  Bowel sounds present.  Colostomy with semi- formed stool.  Extremities:  No calf tenderness.  Lymph nodes:  No adenopathy.  CNS:  Nonfocal.  LAB DATA:  12/16/2011 white cell count 4.2, hemoglobin 12.2, hematocrit 36.8, platelets 177.  CMET pending.  IMPRESSION/PLAN:  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  She is status post concurrent Xeloda with radiation therapy from 05/30/2011 through 07/07/2011.  Status post laparoscopic partial colectomy on 08/15/2011 revealing a T3 N0 (0/7 lymph nodes) well-differentiated mucinous adenocarcinoma of the rectum.  She is also status post I and D for surgical wound infection for which she was on antibiotics.  She is doing much better.  Her performance status has improved remarkably.  She begins adjuvant Xeloda for 4 cycles beginning December 19, 2011.  She follows up on April 2nd with cycle 2.    ______________________________ Laurice Record, [name] LIO/MEDQ  D:  12/16/2011  T:  12/16/2011  Job:  [id number]

## 2011-12-16 NOTE — Patient Instructions (Signed)
Patient to follow up as instructed.  

## 2011-12-16 NOTE — Telephone Encounter (Signed)
called pt and provided appt with Dr. Christella Hartigan for 03/26 @ 10:30am also scheduled f/u for 04/02

## 2011-12-16 NOTE — Progress Notes (Signed)
This office note has been dictated.

## 2011-12-21 ENCOUNTER — Other Ambulatory Visit: Payer: Self-pay | Admitting: Internal Medicine

## 2011-12-22 ENCOUNTER — Telehealth: Payer: Self-pay | Admitting: *Deleted

## 2011-12-22 ENCOUNTER — Telehealth: Payer: Self-pay

## 2011-12-22 DIAGNOSIS — R040 Epistaxis: Secondary | ICD-10-CM

## 2011-12-22 NOTE — Telephone Encounter (Signed)
Pt called requesting a referral to ENT for nose bleed per Oncologist.

## 2011-12-22 NOTE — Telephone Encounter (Signed)
Ok to refer to [facility] ENT - 1st available for recurrent nosebleed. [facility] notified

## 2011-12-22 NOTE — Telephone Encounter (Signed)
Spoke with pt again , and instructed pt to notify her primary ASAP  For possible referral to ENT office  As per md's instructions.   Informed pt that md did not think her symptom was related to Xeloda.   Pt voiced understanding.

## 2011-12-22 NOTE — Telephone Encounter (Signed)
Pt called this am re:  Pt had nosebleed on Fri. 3/8 lasting just about all day.    Pt had nosebleed again yesterday 3/13  And still going on today.    Pt  Stated she had nosebleed  When pt was in the hospital last year.     Pt  Had restarted  Xeloda  1500 mg  Po  BID  On  12/19/11.     Pt would like to know what Dr. Dalene Carrow recommends. Pt's   Phone     [phone].

## 2011-12-26 ENCOUNTER — Telehealth: Payer: Self-pay | Admitting: Internal Medicine

## 2011-12-26 NOTE — Telephone Encounter (Signed)
Received 4 pages. Sent to Dr. Debby Bud. SD 12/26/11

## 2012-01-03 ENCOUNTER — Ambulatory Visit (INDEPENDENT_AMBULATORY_CARE_PROVIDER_SITE_OTHER): Payer: Medicare Other | Admitting: Gastroenterology

## 2012-01-03 ENCOUNTER — Encounter: Payer: Self-pay | Admitting: Gastroenterology

## 2012-01-03 VITALS — BP 122/72 | HR 82 | Ht 68.0 in | Wt 208.0 lb

## 2012-01-03 DIAGNOSIS — C20 Malignant neoplasm of rectum: Secondary | ICD-10-CM

## 2012-01-03 NOTE — Progress Notes (Signed)
Review of pertinent gastrointestinal problems: 1. Rectal adenocarcinoma diagnosed July 21012 (Dr. 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 She is tatus post concurrent Xeloda with radiation therapy from 05/30/2011 through 07/07/2011. Status post laparoscopic LAR with diverting loop ileostomy  (Dr. 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  Adjuvant chemo (Dr. Dalene Carrow) started March 2013.   HPI: This is a  very pleasant 66 year old woman whom I diagnosed with rectal cancer last July. See her treatment course summarized above.  Completed first cycle of xeloda, will be done with last round the end of May.  Has had no rectal output since ileostomy.  Had left nostil cautery last week.  Intermittent nose bleeds.  Only on ASA.   Past Medical History  Diagnosis Date  . IBS (irritable bowel syndrome)   . Hyperlipidemia   . Allergic rhinitis   . History of pneumonia   . COPD (chronic obstructive pulmonary disease)   . Asthma   . Heart murmur     Aortic  . Lung nodule     RLL nodule-62mm stable 2006, April 2009, and June 2009  . Candidiasis of breast   . Anemia   . Hyperlipemia   . Obesity   . Unspecified essential hypertension   . Heart valve problem     PER CARDIOLOGIST 05-03-2011  . Sleep difficulties   . Shortness of breath   . Hemorrhoids   . Cancer     rectal  . PONV (postoperative nausea and vomiting)     SEVERE N&V AFTER SURGERIES  . Sleep apnea     PT TOLD BORDERLINE-TRIED CPAP-DID NOT HELP-SHE DOES NOT USE CPAP  . Arthritis   . Ileostomy care 09/01/2011    Past Surgical History  Procedure Date  . Foot surgery     hammer toe  . Hand surgery     carpal tunnel  . Back surgery     2006 SPACER---2007 LUMBAR FUSION  . Tonsillectomy   . Colon resection 08/25/2011    Procedure: COLON RESECTION LAPAROSCOPIC;  Surgeon: [name], MD;   Location: [facility] ORS;  Service: General;  Laterality: N/A;  Laparoscopic Assisted Low Anterior Resection Diverting Ostomy and onQ pain pump  . Ileostomy     Current Outpatient Prescriptions  Medication Sig Dispense Refill  . ADVAIR DISKUS 100-50 MCG/DOSE AEPB INHALE 1 DOSE BY MOUTH TWICE A DAY  60 each  5  . aspirin 81 MG tablet Take 81 mg by mouth at bedtime.       . Calcium Carbonate-Vitamin D (CALCIUM 600 + D PO) Take 600 mg by mouth at bedtime.       . capecitabine (XELODA) 500 MG tablet Take 1,500 mg by mouth. Take 3 tabs po  BID  X  14  Days  Followed  By  7  Days rest. Gave rx  To  Ebony,  Care management. Has  5  Refills.       Marland Kitchen diltiazem (CARDIZEM CD) 120 MG 24 hr capsule Take 120 mg by mouth at bedtime.       . diphenoxylate-atropine (LOMOTIL) 2.5-0.025 MG per tablet 1 tablet as needed.       Marland Kitchen losartan (COZAAR) 50 MG tablet daily.      Marland Kitchen lovastatin (MEVACOR) 40 MG tablet take 1 tablet by mouth once daily  30 tablet  3  . montelukast (SINGULAIR) 10 MG tablet Take 10  mg by mouth at bedtime.       Marland Kitchen nystatin (MYCOSTATIN) powder Apply 1 Units topically daily. Applies to skin folds or around stoma as needed for fungal infection.      Marland Kitchen oxyCODONE-acetaminophen (PERCOCET) 5-325 MG per tablet       . pantoprazole (PROTONIX) 40 MG tablet Take 40 mg by mouth daily.       . prochlorperazine (COMPAZINE) 10 MG tablet Take 10 mg by mouth every 6 (six) hours as needed. NAUSEA      . SPIRIVA HANDIHALER 18 MCG inhalation capsule inhale the contents of one capsule inTO the handihaler daily  30 capsule  1    Allergies as of 01/03/2012 - Review Complete 01/03/2012  Allergen Reaction Noted  . Codeine Nausea Only   . Prednisone Hives   . Sulfonamide derivatives Nausea Only     Family History  Problem Relation Age of Onset  . Lung cancer Father   . Cancer Father     lung  . Diabetes    . Heart disease    . Heart disease Mother   . Diabetes Mother     History   Social History  . Marital  Status: Married    Spouse Name: N/A    Number of Children: N/A  . Years of Education: N/A   Occupational History  . Not on file.   Social History Main Topics  . Smoking status: Former Smoker -- 1.5 packs/day for 10 years    Types: Cigarettes    Quit date: 12/27/1974  . Smokeless tobacco: Never Used  . Alcohol Use: No     rare  . Drug Use: No  . Sexually Active: No   Other Topics Concern  . Not on file   Social History Narrative  . No narrative on file      Physical Exam: BP 122/72  Pulse 82  Ht 5\' 8"  (1.727 m)  Wt 208 lb (94.348 kg)  BMI 31.63 kg/m2 Constitutional: generally well-appearing Psychiatric: alert and oriented x3 Abdomen: soft, nontender, nondistended, no obvious ascites, no peritoneal signs, normal bowel sounds, right sided ileostomy bag was normal-appearing     Assessment and plan: 66 y.o. female with personal history of rectal cancer  I would prefer that she is done with her adjuvant chemotherapy prior to surveillance colonoscopy. That chemotherapy should be done in the end of May. We will get her scheduled for a colonoscopy in mid June. She has a loop ileostomy and so we cannot prep her orally. Instead we will use 2-3 and emesis the morning of the procedure.

## 2012-01-03 NOTE — Patient Instructions (Addendum)
Colonoscopy for rectal cancer surveillance in Mid June [facility]), will prep with 2 enemas the morning of and 1 enema in LEC at arrival given your diverting ileostomy. Please call back to schedule this appointment.  [phone] A copy of this information will be made available to Drs. Odogwu, Byerly.

## 2012-01-04 ENCOUNTER — Other Ambulatory Visit: Payer: Self-pay | Admitting: Internal Medicine

## 2012-01-10 ENCOUNTER — Telehealth: Payer: Self-pay | Admitting: Hematology and Oncology

## 2012-01-10 ENCOUNTER — Ambulatory Visit (HOSPITAL_BASED_OUTPATIENT_CLINIC_OR_DEPARTMENT_OTHER): Payer: Medicare Other | Admitting: Hematology and Oncology

## 2012-01-10 ENCOUNTER — Encounter: Payer: Self-pay | Admitting: Hematology and Oncology

## 2012-01-10 ENCOUNTER — Other Ambulatory Visit (HOSPITAL_BASED_OUTPATIENT_CLINIC_OR_DEPARTMENT_OTHER): Payer: Medicare Other | Admitting: Lab

## 2012-01-10 VITALS — BP 104/58 | HR 76 | Temp 96.9°F | Ht 68.0 in | Wt 211.3 lb

## 2012-01-10 DIAGNOSIS — C20 Malignant neoplasm of rectum: Secondary | ICD-10-CM

## 2012-01-10 LAB — COMPREHENSIVE METABOLIC PANEL
ALT: 12 U/L (ref 0–35)
AST: 15 U/L (ref 0–37)
Albumin: 3.8 g/dL (ref 3.5–5.2)
Alkaline Phosphatase: 72 U/L (ref 39–117)
BUN: 27 mg/dL — ABNORMAL HIGH (ref 6–23)
CO2: 22 mEq/L (ref 19–32)
Calcium: 9.5 mg/dL (ref 8.4–10.5)
Chloride: 110 mEq/L (ref 96–112)
Creatinine, Ser: 1.27 mg/dL — ABNORMAL HIGH (ref 0.50–1.10)
Glucose, Bld: 82 mg/dL (ref 70–99)
Potassium: 5 mEq/L (ref 3.5–5.3)
Sodium: 140 mEq/L (ref 135–145)
Total Bilirubin: 0.7 mg/dL (ref 0.3–1.2)
Total Protein: 6.3 g/dL (ref 6.0–8.3)

## 2012-01-10 LAB — CBC WITH DIFFERENTIAL/PLATELET
BASO%: 0.8 % (ref 0.0–2.0)
Basophils Absolute: 0 10*3/uL (ref 0.0–0.1)
EOS%: 3.3 % (ref 0.0–7.0)
Eosinophils Absolute: 0.1 10*3/uL (ref 0.0–0.5)
HCT: 33 % — ABNORMAL LOW (ref 34.8–46.6)
HGB: 11 g/dL — ABNORMAL LOW (ref 11.6–15.9)
LYMPH%: 11.7 % — ABNORMAL LOW (ref 14.0–49.7)
MCH: 33.2 pg (ref 25.1–34.0)
MCHC: 33.4 g/dL (ref 31.5–36.0)
MCV: 99.3 fL (ref 79.5–101.0)
MONO#: 0.4 10*3/uL (ref 0.1–0.9)
MONO%: 10.4 % (ref 0.0–14.0)
NEUT#: 3.2 10*3/uL (ref 1.5–6.5)
NEUT%: 73.8 % (ref 38.4–76.8)
Platelets: 142 10*3/uL — ABNORMAL LOW (ref 145–400)
RBC: 3.32 10*6/uL — ABNORMAL LOW (ref 3.70–5.45)
RDW: 18.9 % — ABNORMAL HIGH (ref 11.2–14.5)
WBC: 4.3 10*3/uL (ref 3.9–10.3)
lymph#: 0.5 10*3/uL — ABNORMAL LOW (ref 0.9–3.3)
nRBC: 0 % (ref 0–0)

## 2012-01-10 NOTE — Telephone Encounter (Signed)
appts made and printed for  Pt aom °

## 2012-01-10 NOTE — Progress Notes (Signed)
This office note has been dictated.

## 2012-01-10 NOTE — Progress Notes (Signed)
CC:   Rosalyn Gess. Norins, MD [name], MD  IDENTIFYING STATEMENT:  The patient is a 66 year old woman with rectal cancer who presents for followup.  INTERVAL HISTORY:  Ms. Wildasin continues adjuvant Xeloda which she is tolerating very well.  She is not as weak.  She denies fever, chills and  night sweats.  Denies nausea, vomiting, mucositis or diarrhea.  She also denies pain.  MEDICATIONS:  Reviewed and updated.  ALLERGIES:  Sulfa, codeine and prednisone.  Past medical history, family history, social history unchanged.  REVIEW OF SYSTEMS:  A 10 point review of systems negative.  PHYSICAL EXAMINATION:  General:  The patient is a well-appearing, well- nourished woman in no distress.  Vitals:  Pulse 76, blood pressure 104/58, temperature 96.9, respirations 20, weight 212.3 pounds.  HEENT: Head is atraumatic, normocephalic.  Sclerae anicteric.  Mouth moist. Neck:  Supple.  Chest:  Clear.  CVS:  Unremarkable.  Abdomen:  Soft, nontender.  Bowel sounds present.  Colostomy with formed stool. Extremities:  No calf tenderness.  Lymph nodes:  Without adenopathy. CNS:  Nonfocal.  LAB DATA:  01/10/2012 white cell count 4.3, hemoglobin 11, hematocrit 33, platelets 142.  CMET pending.  IMPRESSION AND PLAN:  Ms. Esquivias is a 66 year old woman with a uT3 N1 low lying invasive rectal adenocarcinoma diagnosed in August 2012.  She is status post concurrent Xeloda with radiation therapy from 05/30/2011 through 07/07/2011.  She is status post laparoscopic partial colectomy on 08/15/2011 revealing a T3 N0 (0/7 lymph nodes) well-differentiated mucinous adenocarcinoma of rectum.  She was status post I and D for surgical wound infection and complete course of antibiotics.  She began her first cycle of adjuvant Xeloda on 12/19/2011.  She began her second cycle on January 09, 2012.  She has done well thus far and will continue with the current plan.  She follows up prior to cycle  3.    ______________________________ Laurice Record, [name] LIO/MEDQ  D:  01/10/2012  T:  01/10/2012  Job:  [id number]

## 2012-01-12 ENCOUNTER — Ambulatory Visit (HOSPITAL_COMMUNITY)
Admission: RE | Admit: 2012-01-12 | Discharge: 2012-01-12 | Disposition: A | Discharge status: Home / Self Care | Payer: Medicare Other | Source: Ambulatory Visit | Attending: Internal Medicine | Admitting: Internal Medicine

## 2012-01-12 DIAGNOSIS — Z1231 Encounter for screening mammogram for malignant neoplasm of breast: Secondary | ICD-10-CM

## 2012-01-16 ENCOUNTER — Other Ambulatory Visit: Payer: Self-pay | Admitting: Internal Medicine

## 2012-01-16 ENCOUNTER — Encounter: Payer: Self-pay | Admitting: Gastroenterology

## 2012-01-16 DIAGNOSIS — R928 Other abnormal and inconclusive findings on diagnostic imaging of breast: Secondary | ICD-10-CM

## 2012-01-17 ENCOUNTER — Ambulatory Visit
Admission: RE | Admit: 2012-01-17 | Discharge: 2012-01-17 | Disposition: A | Discharge status: Home / Self Care | Payer: Medicare Other | Source: Ambulatory Visit | Attending: Internal Medicine | Admitting: Internal Medicine

## 2012-01-17 DIAGNOSIS — R928 Other abnormal and inconclusive findings on diagnostic imaging of breast: Secondary | ICD-10-CM

## 2012-01-22 ENCOUNTER — Other Ambulatory Visit: Payer: Self-pay | Admitting: Internal Medicine

## 2012-01-24 ENCOUNTER — Other Ambulatory Visit: Payer: Self-pay | Admitting: Internal Medicine

## 2012-01-24 ENCOUNTER — Telehealth: Payer: Self-pay | Admitting: Internal Medicine

## 2012-01-24 MED ORDER — ALBUTEROL SULFATE HFA 108 (90 BASE) MCG/ACT IN AERS: 2.0000 | INHALATION_SPRAY | Freq: Four times a day (QID) | RESPIRATORY_TRACT | Status: DC | PRN

## 2012-01-24 MED ORDER — TIOTROPIUM BROMIDE MONOHYDRATE 18 MCG IN CAPS: 18.0000 ug | ORAL_CAPSULE | Freq: Every day | RESPIRATORY_TRACT | Status: DC

## 2012-01-24 NOTE — Telephone Encounter (Signed)
I spoke with pt and stated she needed her spiriva and ventolin inhaler sent to the pharmacy. Pt is scheduled to see MR on 03/01/12 at 2:00 and is aware to keep apt for further refills. She voiced her understanding and needed nothing further

## 2012-01-27 ENCOUNTER — Other Ambulatory Visit (HOSPITAL_BASED_OUTPATIENT_CLINIC_OR_DEPARTMENT_OTHER): Payer: Medicare Other | Admitting: Lab

## 2012-01-27 ENCOUNTER — Encounter: Payer: Self-pay | Admitting: Hematology and Oncology

## 2012-01-27 ENCOUNTER — Telehealth: Payer: Self-pay | Admitting: Hematology and Oncology

## 2012-01-27 ENCOUNTER — Ambulatory Visit (HOSPITAL_BASED_OUTPATIENT_CLINIC_OR_DEPARTMENT_OTHER): Payer: Medicare Other | Admitting: Hematology and Oncology

## 2012-01-27 VITALS — BP 117/58 | HR 80 | Temp 97.5°F | Ht 68.0 in | Wt 214.8 lb

## 2012-01-27 DIAGNOSIS — C20 Malignant neoplasm of rectum: Secondary | ICD-10-CM

## 2012-01-27 LAB — CBC WITH DIFFERENTIAL/PLATELET
BASO%: 0.6 % (ref 0.0–2.0)
Basophils Absolute: 0 10*3/uL (ref 0.0–0.1)
EOS%: 2.8 % (ref 0.0–7.0)
Eosinophils Absolute: 0.1 10*3/uL (ref 0.0–0.5)
HCT: 32.5 % — ABNORMAL LOW (ref 34.8–46.6)
HGB: 11.1 g/dL — ABNORMAL LOW (ref 11.6–15.9)
LYMPH%: 13.2 % — ABNORMAL LOW (ref 14.0–49.7)
MCH: 35.3 pg — ABNORMAL HIGH (ref 25.1–34.0)
MCHC: 34 g/dL (ref 31.5–36.0)
MCV: 103.7 fL — ABNORMAL HIGH (ref 79.5–101.0)
MONO#: 0.5 10*3/uL (ref 0.1–0.9)
MONO%: 10.5 % (ref 0.0–14.0)
NEUT#: 3.5 10*3/uL (ref 1.5–6.5)
NEUT%: 72.9 % (ref 38.4–76.8)
Platelets: 164 10*3/uL (ref 145–400)
RBC: 3.13 10*6/uL — ABNORMAL LOW (ref 3.70–5.45)
RDW: 21.9 % — ABNORMAL HIGH (ref 11.2–14.5)
WBC: 4.8 10*3/uL (ref 3.9–10.3)
lymph#: 0.6 10*3/uL — ABNORMAL LOW (ref 0.9–3.3)
nRBC: 0 % (ref 0–0)

## 2012-01-27 LAB — COMPREHENSIVE METABOLIC PANEL
ALT: 9 U/L (ref 0–35)
AST: 16 U/L (ref 0–37)
Albumin: 3.8 g/dL (ref 3.5–5.2)
Alkaline Phosphatase: 68 U/L (ref 39–117)
BUN: 20 mg/dL (ref 6–23)
CO2: 23 mEq/L (ref 19–32)
Calcium: 9.1 mg/dL (ref 8.4–10.5)
Chloride: 107 mEq/L (ref 96–112)
Creatinine, Ser: 1.02 mg/dL (ref 0.50–1.10)
Glucose, Bld: 93 mg/dL (ref 70–99)
Potassium: 4 mEq/L (ref 3.5–5.3)
Sodium: 140 mEq/L (ref 135–145)
Total Bilirubin: 0.8 mg/dL (ref 0.3–1.2)
Total Protein: 6.3 g/dL (ref 6.0–8.3)

## 2012-01-27 NOTE — Patient Instructions (Signed)
Patient to follow up as instructed.   Current Outpatient Prescriptions  Medication Sig Dispense Refill  . ADVAIR DISKUS 100-50 MCG/DOSE AEPB INHALE 1 DOSE BY MOUTH TWICE A DAY  60 each  5  . albuterol (VENTOLIN HFA) 108 (90 BASE) MCG/ACT inhaler Inhale 2 puffs into the lungs every 6 (six) hours as needed for wheezing.  1 Inhaler  1  . aspirin 81 MG tablet Take 81 mg by mouth at bedtime.       . Calcium Carbonate-Vitamin D (CALCIUM 600 + D PO) Take 600 mg by mouth at bedtime.       . capecitabine (XELODA) 500 MG tablet Take 1,500 mg by mouth. Take 3 tabs po  BID  X  14  Days  Followed  By  7  Days rest. Gave rx  To  Ebony,  Care management. Has  5  Refills.       Marland Kitchen diltiazem (CARDIZEM CD) 120 MG 24 hr capsule Take 120 mg by mouth at bedtime.       . diphenoxylate-atropine (LOMOTIL) 2.5-0.025 MG per tablet 1 tablet as needed.       Marland Kitchen losartan (COZAAR) 50 MG tablet daily.      Marland Kitchen lovastatin (MEVACOR) 40 MG tablet take 1 tablet by mouth once daily  30 tablet  3  . montelukast (SINGULAIR) 10 MG tablet Take 10 mg by mouth at bedtime.       Marland Kitchen nystatin (MYCOSTATIN) powder Apply 1 Units topically daily. Applies to skin folds or around stoma as needed for fungal infection.      . pantoprazole (PROTONIX) 40 MG tablet Take 40 mg by mouth daily.       . prochlorperazine (COMPAZINE) 10 MG tablet Take 10 mg by mouth every 6 (six) hours as needed. NAUSEA      . tiotropium (SPIRIVA HANDIHALER) 18 MCG inhalation capsule Place 1 capsule (18 mcg total) into inhaler and inhale daily.  30 capsule  1  . oxyCODONE-acetaminophen (PERCOCET) 5-325 MG per tablet Take 1 tablet by mouth as needed.             April 2013  Sunday Monday Tuesday Wednesday Thursday Friday Saturday      1   2   LAB MO    10:00 AM  (15 min.)  Marcelle Smiling [place]  [facility] MEDICAL ONCOLOGY   EST PT 30  10:30 AM  (30 min.)  Laurice Record, MD  [facility] MEDICAL ONCOLOGY 3   4   MM DIGITAL  SCREENING  10:00 AM  (10 min.)  Wh-Mm 1  [facility] M[facility] 5   6    7   8   9    MM GI BCG DIAG 10 MIN  10:00 AM  (10 min.)  Gi-Bcg Mm General Dg Mammo Room  [facility] [place]  IMAGING 10   11   12   13    14   15   16   17   18   19    LAB MO     2:30 PM  (15 min.)  Windell Hummingbird  [facility] MEDICAL ONCOLOGY   EST PT 30   3:00 PM  (30 min.)  Laurice Record, MD  [facility] MEDICAL ONCOLOGY 20    21   22   23   24   25   26    27  28   29   08 Mar 2012  Sunday Monday Tuesday Wednesday Thursday Friday Saturday            1   2   3    FOLLOW UP 15  10:00 AM  (15 min.)  Jonna Coup, MD  [facility] RADIATION ONCOLOGY 4    5   6   7   8   9   10    POST OP/RECHECK  12:00 PM  (15 min.)  [name], MD  [facility] 11    12   13   14   15   16   17   18    19   20   21    PRE OP 30 WQ  10:30 AM  (30 min.)  Lbgi-Lec Previsit Rm50  [facility] 22   23   OFFICE VISIT   2:00 PM  (15 min.)  [name], MD  [facility] 24   25    26   27   28   29   30    31

## 2012-01-27 NOTE — Progress Notes (Signed)
This office note has been dictated.

## 2012-01-27 NOTE — Telephone Encounter (Signed)
gv pt appt schedule for may.  °

## 2012-01-27 NOTE — Progress Notes (Signed)
CC:   Rosalyn Gess. Norins, MD [name], MD [name], [name], [name] [name], MD [name], MD  IDENTIFYING STATEMENT:  The patient is a 66 year old woman with rectal cancer who presents for followup.  INTERVAL HISTORY:  [name] continues on Xeloda.  She is tolerating it with very minimal difficulties.  She has no complaints of nausea, vomiting, mucositis, diarrhea, hand-foot syndrome.  She denies pain. Her energy levels are good.  MEDICATIONS:  Xeloda 1500 mg b.i.d. for 14 out of every 21 days.  Rest of medicines reviewed and updated.  ALLERGIES:  Sulfa, codeine and prednisone.  Past medical history, family history, social history unchanged.  REVIEW OF SYSTEMS:  A 10-point review of systems negative.  PHYSICAL EXAM:  General:  Patient is alert and oriented x3.  Vitals: Pulse 80, blood pressure 117/58, temperature 97.5, respirations 20, weight is 214.8 pounds.  HEENT:  Head is atraumatic, normocephalic. Sclerae anicteric.  Mouth moist without ulcerations, thrush, or lesions. Neck:  Supple without adenopathy.  Chest:  Demonstrates good entry bilaterally and clear to both percussion and auscultation. Cardiovascular:  Reveals 1st and 2nd heart sounds present.  No added sounds or murmurs.  Abdomen:  Soft, nontender.  Colostomy bag noted with semi-formed stool.  Bowel sounds present.  Extremities:  No calf tenderness.  CNS:  Nonfocal.  IMPRESSION AND PLAN:  [name] is a 66 year old woman with a uT3 N1 low-lying rectal invasive adenocarcinoma diagnosed in August 2012.  She is status post concurrent Xeloda with radiation therapy from 05/30/2011 through 07/07/2011.  She is status post laparoscopic partial colectomy on 08/15/2011 revealing a T3 N0 (0/7 positive lymph nodes), well- differentiated mucinous adenocarcinoma of the rectum.  She is also status post I and D for surgical wound infection for which she was on antibiotics.  She is currently on Xeloda  and has received 2 cycles.  She has done very well.  Her labs are adequate.  She begins cycle 3 on 01/30/2012.  No dose adjustments will be made.  She follows up prior to her 4th and final cycle.    ______________________________ Laurice Record, [name] LIO/MEDQ  D:  01/27/2012  T:  01/27/2012  Job:  [id number]

## 2012-02-01 ENCOUNTER — Telehealth: Payer: Self-pay | Admitting: Internal Medicine

## 2012-02-01 ENCOUNTER — Other Ambulatory Visit: Payer: Self-pay | Admitting: Internal Medicine

## 2012-02-01 MED ORDER — MONTELUKAST SODIUM 10 MG PO TABS: 10.0000 mg | ORAL_TABLET | Freq: Every day | ORAL | Status: DC

## 2012-02-01 NOTE — Telephone Encounter (Signed)
I spoke with pt and is requesting a refill on her Singulair. I advised pt will send in refill but needs to keep OV for further refills. She voiced her understanding

## 2012-02-06 ENCOUNTER — Encounter: Payer: Self-pay | Admitting: Radiation Oncology

## 2012-02-06 DIAGNOSIS — Z923 Personal history of irradiation: Secondary | ICD-10-CM | POA: Insufficient documentation

## 2012-02-10 ENCOUNTER — Ambulatory Visit
Admission: RE | Admit: 2012-02-10 | Discharge: 2012-02-10 | Disposition: A | Discharge status: Home / Self Care | Payer: Medicare Other | Source: Ambulatory Visit | Attending: Radiation Oncology | Admitting: Radiation Oncology

## 2012-02-10 ENCOUNTER — Encounter: Payer: Self-pay | Admitting: Radiation Oncology

## 2012-02-10 VITALS — BP 109/65 | HR 61 | Temp 97.1°F | Resp 20 | Wt 214.6 lb

## 2012-02-10 DIAGNOSIS — C20 Malignant neoplasm of rectum: Secondary | ICD-10-CM

## 2012-02-10 NOTE — Progress Notes (Signed)
Radiation Oncology         [phone] ________________________________  Name: [name] MRN: [id number]  Date: 02/10/2012  DOB: 1945/10/29  Follow-Up Visit Note  CC: Illene Regulus, MD, MD  Debby Bud Rosalyn Gess, MD Arlan Organ, [name]  Diagnosis:   Rectal cancer  Interval Since Last Radiation:  Approximately 8 months   Narrative:  The patient returns today for routine follow-up.  She proceeded with surgery since she was last seen. This went well in terms of the actual surgery and negative margins were obtained. None of these 7 lymph nodes demonstrated carcinoma. The maximum size of the tumor was 5.5 cm. The patient states that she had a difficult time postoperatively with some wound healing issues. These have resolved. The patient has been proceeding with chemotherapy with Xeloda and she finishes her final treatment in regards to this on 03/04/2012. She therefore had          somewhat of a difficult time soon after I saw her but she has been doing much better recently. She is scheduled for a colonoscopy coming up and then she is to proceed with a reversal procedure after this in the fairly near future as well.                      ALLERGIES:  is allergic to codeine; prednisone; and sulfonamide derivatives.  Meds: Current Outpatient Prescriptions  Medication Sig Dispense Refill  . ADVAIR DISKUS 100-50 MCG/DOSE AEPB INHALE 1 DOSE BY MOUTH TWICE A DAY  60 each  5  . albuterol (VENTOLIN HFA) 108 (90 BASE) MCG/ACT inhaler Inhale 2 puffs into the lungs every 6 (six) hours as needed for wheezing.  1 Inhaler  1  . aspirin 81 MG tablet Take 81 mg by mouth at bedtime.       . Calcium Carbonate-Vitamin D (CALCIUM 600 + D PO) Take 600 mg by mouth at bedtime.       . capecitabine (XELODA) 500 MG tablet Take 1,500 mg by mouth. Take 3 tabs po  BID  X  14  Days  Followed  By  7  Days rest. Gave rx  To  Ebony,  Care management. Has  5  Refills.       Marland Kitchen diltiazem (CARDIZEM CD) 120 MG 24 hr capsule  Take 120 mg by mouth at bedtime.       . diphenoxylate-atropine (LOMOTIL) 2.5-0.025 MG per tablet 1 tablet as needed.       Marland Kitchen losartan (COZAAR) 50 MG tablet daily.      Marland Kitchen lovastatin (MEVACOR) 40 MG tablet take 1 tablet by mouth once daily  30 tablet  3  . montelukast (SINGULAIR) 10 MG tablet Take 1 tablet (10 mg total) by mouth at bedtime.  30 tablet  0  . nystatin (MYCOSTATIN) powder Apply 1 Units topically daily. Applies to skin folds or around stoma as needed for fungal infection.      . pantoprazole (PROTONIX) 40 MG tablet Take 40 mg by mouth daily.       . prochlorperazine (COMPAZINE) 10 MG tablet Take 10 mg by mouth every 6 (six) hours as needed. NAUSEA      . tiotropium (SPIRIVA HANDIHALER) 18 MCG inhalation capsule Place 1 capsule (18 mcg total) into inhaler and inhale daily.  30 capsule  1  . oxyCODONE-acetaminophen (PERCOCET) 5-325 MG per tablet Take 1 tablet by mouth as needed.         Physical Findings: The  patient is in no acute distress. Patient is alert and oriented.  weight is 214 lb 9.6 oz (97.342 kg). Her oral temperature is 97.1 F (36.2 C). Her blood pressure is 109/65 and her pulse is 61. Her respiration is 20. Marland Kitchen   General: Well-developed, in no acute distress HEENT: Normocephalic, atraumatic Cardiovascular: Regular rate and rhythm Respiratory: Clear to auscultation bilaterally GI: Soft, nontender, normal bowel sounds Extremities: No edema present    Lab Findings: Lab Results  Component Value Date   WBC 4.8 01/27/2012   HGB 11.1* 01/27/2012   HCT 32.5* 01/27/2012   MCV 103.7* 01/27/2012   PLT 164 01/27/2012     Radiographic Findings: Mm Digital Diag Ltd R  01/17/2012  *RADIOLOGY REPORT*  Clinical Data:  Further evaluation of right axillary asymmetry  DIGITAL DIAGNOSTIC RIGHT MAMMOGRAM  Comparison:  01/12/2012, 12/29/2010  Findings:  No evidence of mass or architectural distortion.  Spot compression views reveal that the asymmetry corresponds to a sub centimeter  normal-appearing axillary lymph node.  IMPRESSION: No suspicious findings.  Normal axillary lymph node.  BI-RADS CATEGORY 2:  Benign finding(s).  Recommendation:  Return to annual screening mammography.  Original Report Authenticated By: [email or web address]   Mm Digital Screening  01/13/2012  DG SCREEN MAMMOGRAM BILATERAL Bilateral CC and MLO view(s) were taken.  DIGITAL SCREENING MAMMOGRAM WITH CAD: The breast tissue is almost entirely fatty.  A possible mass is noted in the right breast.  Spot  compression views and possibly sonography are recommended for further evaluation.  In the left  breast, no masses or malignant type calcifications are identified.  Compared with prior studies.  Images were processed with CAD.  IMPRESSION: Possible mass, right breast.  Additional evaluation is indicated.  The patient will be contacted  for additional studies and a supplementary report will follow.  No specific mammographic evidence  of malignancy, left breast.  ASSESSMENT: Need additional imaging evaluation and/or prior mammograms for comparison - BI-RADS 0  Further imaging of the right breast. ,    Impression:     66 year old female with locally advanced rectal cancer status post neoadjuvant chemoradiotherapy followed by surgical resection. Now near completing adjuvant chemotherapy.  Plan:  The patient is doing well with no evidence of recurrent disease. I will have her return to our clinic on a when necessary basis.    I spent 10 minutes with the patient today, the majority of which was spent counseling the patient on the diagnosis of cancer and coordinating care.   [name], [name], [name]

## 2012-02-10 NOTE — Progress Notes (Signed)
Follow up rectal cancer, rad txs=05/30/11-07/07/11 Alert oriented x3, voiced no c/o pain , iliostomy right side abdomen with yellowish grainy soft stool, "no schedule on the bowel movements, sees Dr.Byely next week, colonoscopy scheduled June 4th,2013, with Dr.Jacobs reversal surgery to be scheduled after colonoscopy staes patient, patient on Xeloda on for 2 weeks,off 1 week, May 26,2013 last Xeloda then will be finished,leaving for the beach next day, 3:53 PM

## 2012-02-17 ENCOUNTER — Ambulatory Visit (INDEPENDENT_AMBULATORY_CARE_PROVIDER_SITE_OTHER): Payer: Medicare Other | Admitting: General Surgery

## 2012-02-17 ENCOUNTER — Telehealth: Payer: Self-pay | Admitting: Hematology and Oncology

## 2012-02-17 ENCOUNTER — Encounter: Payer: Self-pay | Admitting: Hematology and Oncology

## 2012-02-17 ENCOUNTER — Other Ambulatory Visit (HOSPITAL_BASED_OUTPATIENT_CLINIC_OR_DEPARTMENT_OTHER): Payer: Medicare Other | Admitting: Lab

## 2012-02-17 ENCOUNTER — Encounter (INDEPENDENT_AMBULATORY_CARE_PROVIDER_SITE_OTHER): Payer: Self-pay | Admitting: General Surgery

## 2012-02-17 ENCOUNTER — Ambulatory Visit (HOSPITAL_BASED_OUTPATIENT_CLINIC_OR_DEPARTMENT_OTHER): Payer: Medicare Other | Admitting: Hematology and Oncology

## 2012-02-17 VITALS — BP 99/52 | HR 71 | Temp 96.8°F | Ht 68.0 in | Wt 216.4 lb

## 2012-02-17 VITALS — BP 123/56 | HR 95 | Temp 97.4°F | Ht 68.0 in | Wt 217.4 lb

## 2012-02-17 DIAGNOSIS — C20 Malignant neoplasm of rectum: Secondary | ICD-10-CM

## 2012-02-17 LAB — CBC WITH DIFFERENTIAL/PLATELET
BASO%: 0.9 % (ref 0.0–2.0)
Basophils Absolute: 0 10*3/uL (ref 0.0–0.1)
EOS%: 3.1 % (ref 0.0–7.0)
Eosinophils Absolute: 0.1 10*3/uL (ref 0.0–0.5)
HCT: 33.3 % — ABNORMAL LOW (ref 34.8–46.6)
HGB: 11.3 g/dL — ABNORMAL LOW (ref 11.6–15.9)
LYMPH%: 13.3 % — ABNORMAL LOW (ref 14.0–49.7)
MCH: 36.9 pg — ABNORMAL HIGH (ref 25.1–34.0)
MCHC: 34.1 g/dL (ref 31.5–36.0)
MCV: 108.3 fL — ABNORMAL HIGH (ref 79.5–101.0)
MONO#: 0.5 10*3/uL (ref 0.1–0.9)
MONO%: 12.4 % (ref 0.0–14.0)
NEUT#: 3 10*3/uL (ref 1.5–6.5)
NEUT%: 70.3 % (ref 38.4–76.8)
Platelets: 137 10*3/uL — ABNORMAL LOW (ref 145–400)
RBC: 3.07 10*6/uL — ABNORMAL LOW (ref 3.70–5.45)
RDW: 21.3 % — ABNORMAL HIGH (ref 11.2–14.5)
WBC: 4.3 10*3/uL (ref 3.9–10.3)
lymph#: 0.6 10*3/uL — ABNORMAL LOW (ref 0.9–3.3)
nRBC: 0 % (ref 0–0)

## 2012-02-17 LAB — COMPREHENSIVE METABOLIC PANEL
ALT: 9 U/L (ref 0–35)
AST: 16 U/L (ref 0–37)
Albumin: 4 g/dL (ref 3.5–5.2)
Alkaline Phosphatase: 61 U/L (ref 39–117)
BUN: 21 mg/dL (ref 6–23)
CO2: 27 mEq/L (ref 19–32)
Calcium: 9.3 mg/dL (ref 8.4–10.5)
Chloride: 105 mEq/L (ref 96–112)
Creatinine, Ser: 1.06 mg/dL (ref 0.50–1.10)
Glucose, Bld: 95 mg/dL (ref 70–99)
Potassium: 4.3 mEq/L (ref 3.5–5.3)
Sodium: 140 mEq/L (ref 135–145)
Total Bilirubin: 0.9 mg/dL (ref 0.3–1.2)
Total Protein: 6.1 g/dL (ref 6.0–8.3)

## 2012-02-17 NOTE — Progress Notes (Signed)
This office note has been dictated.

## 2012-02-17 NOTE — Assessment & Plan Note (Signed)
Pt finishing chemo this month.  Has staging studies in June.  Plan enema in June, colonoscopy  Will plan ileostomy takedown in late June as long as no issues come up in staging studies.    Discussed surgery. Risks include bleeding, infection, damage to other structures, possible need to convert to open.

## 2012-02-17 NOTE — Telephone Encounter (Signed)
appts made and printed for pt,pt to drink water based contrast and was given instructions for that   aom

## 2012-02-17 NOTE — Patient Instructions (Signed)
[name]  [id number]  [facility] Discharge Instructions  RECOMMENDATIONS MADE BY THE CONSULTANT AND ANY TEST RESULTS WILL BE SENT TO YOUR REFERRING DOCTOR.   EXAM FINDINGS BY MD TODAY AND SIGNS AND SYMPTOMS TO REPORT TO CLINIC OR PRIMARY MD:   Your current list of medications are: Current Outpatient Prescriptions  Medication Sig Dispense Refill  . ADVAIR DISKUS 100-50 MCG/DOSE AEPB INHALE 1 DOSE BY MOUTH TWICE A DAY  60 each  5  . albuterol (VENTOLIN HFA) 108 (90 BASE) MCG/ACT inhaler Inhale 2 puffs into the lungs every 6 (six) hours as needed for wheezing.  1 Inhaler  1  . aspirin 81 MG tablet Take 81 mg by mouth at bedtime.       . Calcium Carbonate-Vitamin D (CALCIUM 600 + D PO) Take 600 mg by mouth at bedtime.       . capecitabine (XELODA) 500 MG tablet Take 1,500 mg by mouth. Take 3 tabs po  BID  X  14  Days  Followed  By  7  Days rest. Gave rx  To  Ebony,  Care management. Has  5  Refills.       Marland Kitchen diltiazem (CARDIZEM CD) 120 MG 24 hr capsule Take 120 mg by mouth at bedtime.       . diphenoxylate-atropine (LOMOTIL) 2.5-0.025 MG per tablet 1 tablet as needed.       Marland Kitchen losartan (COZAAR) 50 MG tablet daily.      Marland Kitchen lovastatin (MEVACOR) 40 MG tablet take 1 tablet by mouth once daily  30 tablet  3  . montelukast (SINGULAIR) 10 MG tablet Take 1 tablet (10 mg total) by mouth at bedtime.  30 tablet  0  . nystatin (MYCOSTATIN) powder Apply 1 Units topically daily. Applies to skin folds or around stoma as needed for fungal infection.      Marland Kitchen oxyCODONE-acetaminophen (PERCOCET) 5-325 MG per tablet Take 1 tablet by mouth as needed.       . pantoprazole (PROTONIX) 40 MG tablet Take 40 mg by mouth daily.       . prochlorperazine (COMPAZINE) 10 MG tablet Take 10 mg by mouth every 6 (six) hours as needed. NAUSEA      . tiotropium (SPIRIVA HANDIHALER) 18 MCG inhalation capsule Place 1 capsule (18 mcg total) into inhaler and inhale daily.  30 capsule  1     INSTRUCTIONS GIVEN  AND DISCUSSED:   SPECIAL INSTRUCTIONS/FOLLOW-UP:  See above.  I acknowledge that I have been informed and understand all the instructions given to me and received a copy. I do not have any more questions at this time, but understand that I may call the [facility] at [phone] during business hours should I have any further questions or need assistance in obtaining follow-up care.

## 2012-02-17 NOTE — Progress Notes (Signed)
HISTORY: Pt is doing well status post laparoscopic low anterior resection with coloanal anastomosis and diverting ileostomy in November of 2012. She had a delayed wound infection.  She had packing to this area. Her staging studies have been going well. She is due to finished chemotherapy at the end of May. She is set up for colonoscopy on June 4. She has restaging scan set up for June 6. She is set to see Dr. Dalene Carrow on June 7. She has been doing much better and has not had issues with dehydration recently. Her ostomy has been functioning better. She does wear a Depends in case her ostomy leaks.   PERTINENT REVIEW OF SYSTEMS: Otherwise negative.     EXAM: Head: Normocephalic and atraumatic.  Eyes:  Conjunctivae are normal. Pupils are equal, round, and reactive to light. No scleral icterus.  Neck:  Normal range of motion. Neck supple. No tracheal deviation present. No thyromegaly present.  Resp: No respiratory distress, normal effort. Abd:  Abdomen is soft, non distended and non tender. No masses are palpable.  There is no rebound and no guarding. Wearing ostomy belt.  Stool pasty.   Rectal:  Reasonable rectal tone, no stricture.   Neurological: Alert and oriented to person, place, and time. Coordination normal.  Skin: Skin is warm and dry. No rash noted. No diaphoretic. No erythema. No pallor.  Psychiatric: Normal mood and affect. Normal behavior. Judgment and thought content normal.     ASSESSMENT AND PLAN:   Rectal cancer, midrectum ypT3ypN0 (0/7 LN), s/p neoadj chemoXRT, lap LAR with diverting loop ileostomy Pt finishing chemo this month.  Has staging studies in June.  Plan enema in June, colonoscopy  Will plan ileostomy takedown in late June as long as no issues come up in staging studies.    Discussed surgery. Risks include bleeding, infection, damage to other structures, possible need to convert to open.       Maudry Diego, MD Surgical Oncology, General & Endocrine  [facility]  Illene Regulus, MD, MD Debby Bud Rosalyn Gess, MD

## 2012-02-17 NOTE — Patient Instructions (Signed)
Cyndra Numbers will call you about enema   Get colonoscopy and CT scans.  Finish chemo and follow up with Dr. Dalene Carrow.  Will schedule ostomy takedown in late June.

## 2012-02-17 NOTE — Progress Notes (Signed)
CC:   Diane Gess. Norins, MD [name], MD [name], [name], [name] [name], MD  IDENTIFYING STATEMENT:  The patient is a 66 year old woman with rectal cancer who presents for followup.  INTERVAL HISTORY:  [name] begins her final cycle of Xeloda on Feb 20, 2012.  She has done very well and has no complaints thus far.  Has good energy levels.  Denies mucositis and hand-foot syndrome.  MEDICATIONS:  Xeloda 1500 mg p.o. b.i.d. for 14 out of every 21 days. Rest of medicines reviewed and updated.  ALLERGIES:  Sulfa, codeine, and prednisone.  PAST MEDICAL HISTORY/FAMILY HISTORY/SOCIAL HISTORY:  Unchanged.  REVIEW OF SYSTEMS:  Ten-point review of systems negative.  PHYSICAL EXAMINATION:  General:  The patient is a well-appearing, well- nourished woman in no distress.  Vitals:  Pulse 71, blood pressure 99/52, temperature 96.8, respirations 20, weight 216.4 pounds.  HEENT: Head is atraumatic, normocephalic.  Sclerae anicteric.  Mouth moist. Chest:  Clear.  CVS:  Unremarkable.  Abdomen:  Soft, nontender.  Bowel sounds present.  Extremities:  No calf tenderness.  LABORATORY DATA:  02/17/2012 white cell count 4.3, hemoglobin 11.3, hematocrit 33.3, platelets 137.  CMET pending.  IMPRESSION AND PLAN:  [name] is a 66 year old woman with a uT3 N1 low-lying rectal invasive adenocarcinoma diagnosed in August 2012.  She is status post concurrent Xeloda with radiation therapy from 05/30/2011 through 07/07/2011.  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  She is currently on adjuvant Xeloda, beginning 01/30/2012.  She looks well.  Her labs are unremarkable.  She will begin her 4th and final cycle next week. Thereafter she follows up with a CT scan.    ______________________________ Laurice Record, [name] LIO/MEDQ  D:  02/17/2012  T:   02/17/2012  Job:  [id number]

## 2012-02-20 ENCOUNTER — Other Ambulatory Visit (INDEPENDENT_AMBULATORY_CARE_PROVIDER_SITE_OTHER): Payer: Self-pay | Admitting: General Surgery

## 2012-02-21 ENCOUNTER — Other Ambulatory Visit (INDEPENDENT_AMBULATORY_CARE_PROVIDER_SITE_OTHER): Payer: Self-pay | Admitting: General Surgery

## 2012-02-21 DIAGNOSIS — C20 Malignant neoplasm of rectum: Secondary | ICD-10-CM

## 2012-02-23 ENCOUNTER — Ambulatory Visit (INDEPENDENT_AMBULATORY_CARE_PROVIDER_SITE_OTHER): Payer: Medicare Other | Admitting: Internal Medicine

## 2012-02-23 ENCOUNTER — Encounter: Payer: Self-pay | Admitting: Internal Medicine

## 2012-02-23 VITALS — BP 110/68 | HR 71 | Temp 98.0°F | Ht 68.0 in | Wt 218.6 lb

## 2012-02-23 DIAGNOSIS — R911 Solitary pulmonary nodule: Secondary | ICD-10-CM

## 2012-02-23 DIAGNOSIS — R918 Other nonspecific abnormal finding of lung field: Secondary | ICD-10-CM | POA: Insufficient documentation

## 2012-02-23 HISTORY — DX: Solitary pulmonary nodule: R91.1

## 2012-02-23 MED ORDER — MONTELUKAST SODIUM 10 MG PO TABS: 10.0000 mg | ORAL_TABLET | Freq: Every day | ORAL | Status: DC

## 2012-02-23 MED ORDER — TIOTROPIUM BROMIDE MONOHYDRATE 18 MCG IN CAPS: 18.0000 ug | ORAL_CAPSULE | Freq: Every day | RESPIRATORY_TRACT | Status: DC

## 2012-02-23 MED ORDER — FLUTICASONE-SALMETEROL 100-50 MCG/DOSE IN AEPB: 1.0000 | INHALATION_SPRAY | Freq: Two times a day (BID) | RESPIRATORY_TRACT | Status: DC

## 2012-02-23 NOTE — Progress Notes (Signed)
Subjective:    Patient ID: [name], female    DOB: 09-30-1946, 66 y.o.   MRN: [id number]  HPI  OV 02/23/2012  66year old female. Known COPD patient. She has been doing well on  Her stable regimen of inahlers of spiriva and advair and singualir but now wants refills.  She denies any worsening of her class  1-2 dyspnea. Denies cough. CAT score 02/23/2012 is  18 with highes level 4 score for sleep qualit and dyspnea with climbing a hill and level 3 for fatigue. Cough, mucus, and chest tightness and ADL limitations are all level 2 or level 1  Of note, during cancer Rx had CT chest Oct 2012 and pulmonary nodules noted - see below (Ct independently reviewed image and agree with assessment)  Comparison: PET CT 05/26/2011, CT 10/2010  CT CHEST Oct 2012 Findings: No axillary or supraclavicular lymphadenopathy. No  mediastinal or hilar lymphadenopathy. No pericardial fluid.  Esophagus is normal.  There is an irregular nodule in the right upper lobe measuring 12  mm which is unchanged from prior and has adjacent bronchiectasis  also unchanged. Within the left lower lobe, there is a 4 mm  pulmonary nodule (image 30) which is unchanged. Airways are  normal.  IMPRESSION:  1. No evidence of thoracic metastasis.  2. Stable right upper lobe nodularity and bronchiectasis.  3. Stable small left lower lobe pulmonary nodule.      -   Past, Family, Social reviewed: since last visit in 5/10/132  completed cancer chemo - had complications. Is due for colostomy reversal. Had epistaxis and s/p cautery without recurrence. BP under control. Had repeat echo for murmur per hx and stenosis stable .   Review of Systems  Constitutional: Negative for fever and unexpected weight change.  HENT: Negative for ear pain, nosebleeds, congestion, sore throat, rhinorrhea, sneezing, trouble swallowing, dental problem, postnasal drip and sinus pressure.   Eyes: Negative for redness and itching.  Respiratory: Negative  for cough, chest tightness, shortness of breath and wheezing.   Cardiovascular: Negative for palpitations and leg swelling.  Gastrointestinal: Negative for nausea and vomiting.  Genitourinary: Negative for dysuria.  Musculoskeletal: Negative for joint swelling.  Skin: Negative for rash.  Neurological: Negative for headaches.  Hematological: Does not bruise/bleed easily.  Psychiatric/Behavioral: Negative for dysphoric mood. The patient is not nervous/anxious.        Objective:   Physical Exam vitalsreviewed. Constitutional: She is oriented to person, place, and time. She appears well-developed and well-nourished. No distress (obese).  HENT:  Head: Normocephalic and atraumatic.  Right Ear: External ear normal.  Left Ear: External ear normal.  Mouth/Throat: Oropharynx is clear and moist. No oropharyngeal exudate.  Eyes: Conjunctivae and EOM are normal. Pupils are equal, round, and reactive to light. Right eye exhibits no discharge. Left eye exhibits no discharge. No scleral icterus.  Neck: Normal range of motion. Neck supple. No JVD present. No tracheal deviation present. No thyromegaly present.  Cardiovascular: Normal rate, regular rhythm and intact distal pulses.  Exam reveals no gallop and no friction rub.   Murmur heard.      Ejection systolic murmur + Pulmonary/Chest: Effort normal. No respiratory distress. She has  NO WHEEZE She has no rales. She exhibits no tenderness.       No distress Full sentences +  Abdominal: Soft. Bowel sounds are normal. She exhibits no distension and no mass. There is no tenderness. There is no rebound and no guarding.  Musculoskeletal: Normal range of motion. She  exhibits no edema and no tenderness.  Lymphadenopathy:    She has no cervical adenopathy.  Neurological: She is alert and oriented to person, place, and time. She has normal reflexes. No cranial nerve deficit. She exhibits normal muscle tone. Coordination normal.  Skin: Skin is warm and dry.  No rash noted. She is not diaphoretic. No erythema. No pallor.  Psychiatric: She has a normal mood and affect. Her behavior is normal. Judgment and thought content normal.          Assessment & Plan:

## 2012-02-23 NOTE — Patient Instructions (Addendum)
#  COPD  - glad it is stable  - continue spiriva and advair as before; nurse will do refills - once you are done with all cancer treatement and surgery, I will refer you back to pulmonary rehab - till then keep up with daily exercise  - glad you are uptodate with pneumonia vaccine and annual  Flu shot  #Pulmonary nodule  -= will probably be monitored during cancer screening by her oncologist - She will need a ct chest after oct 2013; I will monitor from afar  #Followup  6  Months  spirometry and CAT score at followup

## 2012-02-23 NOTE — Assessment & Plan Note (Addendum)
RLL nodule-63mm stable 2006, April 2009, and June 2009 Small pulm nodules Jan 2012 - > Oct 2012 without change   Plan  - will track these from afar during her ct's for cancer surveilance and recurrence;

## 2012-02-23 NOTE — Assessment & Plan Note (Signed)
Stable disease. CAT score 02/23/2012 is 18. Will refill spiriva and advair. Advised daily exercises but once cancer Rx is complete will refer to pulmonary rehab which is best Rx for fatigue. Reviewed vaccination hx and she is uptodate. Will repeat CAT score and spirometry at followup

## 2012-02-26 ENCOUNTER — Encounter: Payer: Self-pay | Admitting: Internal Medicine

## 2012-02-27 ENCOUNTER — Encounter: Payer: Self-pay | Admitting: Internal Medicine

## 2012-02-28 ENCOUNTER — Ambulatory Visit (AMBULATORY_SURGERY_CENTER): Payer: Medicare Other

## 2012-02-28 VITALS — Ht 68.0 in | Wt 217.9 lb

## 2012-02-28 DIAGNOSIS — Z1211 Encounter for screening for malignant neoplasm of colon: Secondary | ICD-10-CM

## 2012-02-28 DIAGNOSIS — Z85048 Personal history of other malignant neoplasm of rectum, rectosigmoid junction, and anus: Secondary | ICD-10-CM

## 2012-03-01 ENCOUNTER — Ambulatory Visit: Payer: Medicare Other | Admitting: Internal Medicine

## 2012-03-13 ENCOUNTER — Ambulatory Visit (AMBULATORY_SURGERY_CENTER): Payer: Medicare Other | Admitting: Gastroenterology

## 2012-03-13 ENCOUNTER — Encounter: Payer: Self-pay | Admitting: Gastroenterology

## 2012-03-13 ENCOUNTER — Other Ambulatory Visit: Payer: Medicare Other | Admitting: Gastroenterology

## 2012-03-13 VITALS — BP 120/67 | HR 84 | Temp 95.6°F | Resp 20 | Ht 68.0 in | Wt 217.0 lb

## 2012-03-13 DIAGNOSIS — R933 Abnormal findings on diagnostic imaging of other parts of digestive tract: Secondary | ICD-10-CM

## 2012-03-13 DIAGNOSIS — Z85048 Personal history of other malignant neoplasm of rectum, rectosigmoid junction, and anus: Secondary | ICD-10-CM

## 2012-03-13 DIAGNOSIS — Z1211 Encounter for screening for malignant neoplasm of colon: Secondary | ICD-10-CM

## 2012-03-13 MED ORDER — SODIUM CHLORIDE 0.9 % IV SOLN: 500.0000 mL | INTRAVENOUS | Status: DC

## 2012-03-13 NOTE — Patient Instructions (Signed)
YOU HAD AN ENDOSCOPIC PROCEDURE TODAY AT THE [facility]: Refer to the procedure report that was given to you for any specific questions about what was found during the examination.  If the procedure report does not answer your questions, please call your gastroenterologist to clarify.  If you requested that your care partner not be given the details of your procedure findings, then the procedure report has been included in a sealed envelope for you to review at your convenience later.  YOU SHOULD EXPECT: Some feelings of bloating in the abdomen. Passage of more gas than usual.  Walking can help get rid of the air that was put into your GI tract during the procedure and reduce the bloating. If you had a lower endoscopy (such as a colonoscopy or flexible sigmoidoscopy) you may notice spotting of blood in your stool or on the toilet paper. If you underwent a bowel prep for your procedure, then you may not have a normal bowel movement for a few days.  DIET: Your first meal following the procedure should be a light meal and then it is ok to progress to your normal diet.  A half-sandwich or bowl of soup is an example of a good first meal.  Heavy or fried foods are harder to digest and may make you feel nauseous or bloated.  Likewise meals heavy in dairy and vegetables can cause extra gas to form and this can also increase the bloating.  Drink plenty of fluids but you should avoid alcoholic beverages for 24 hours.  ACTIVITY: Your care partner should take you home directly after the procedure.  You should plan to take it easy, moving slowly for the rest of the day.  You can resume normal activity the day after the procedure however you should NOT DRIVE or use heavy machinery for 24 hours (because of the sedation medicines used during the test).    SYMPTOMS TO REPORT IMMEDIATELY: A gastroenterologist can be reached at any hour.  During normal business hours, 8:30 AM to 5:00 PM Monday through Friday,  call [phone].  After hours and on weekends, please call the GI answering service at [phone] who will take a message and have the physician on call contact you.   Following lower endoscopy (colonoscopy or flexible sigmoidoscopy):  Excessive amounts of blood in the stool  Significant tenderness or worsening of abdominal pains  Swelling of the abdomen that is new, acute  Fever of 100F or higher    FOLLOW UP: If any biopsies were taken you will be contacted by phone or by letter within the next 1-3 weeks.  Call your gastroenterologist if you have not heard about the biopsies in 3 weeks.  Our staff will call the home number listed on your records the next business day following your procedure to check on you and address any questions or concerns that you may have at that time regarding the information given to you following your procedure. This is a courtesy call and so if there is no answer at the home number and we have not heard from you through the emergency physician on call, we will assume that you have returned to your regular daily activities without incident.  SIGNATURES/CONFIDENTIALITY: You and/or your care partner have signed paperwork which will be entered into your electronic medical record.  These signatures attest to the fact that that the information above on your After Visit Summary has been reviewed and is understood.  Full responsibility of the confidentiality   of this discharge information lies with you and/or your care-partner.     

## 2012-03-13 NOTE — Progress Notes (Addendum)
Pt here early as directed.  She has brought a fleets enema from home, so Clinical research associate used that.  Enema instilled into rectum and pt told to hold fluid as long as she can tolerate.    She held the fluid about 20 minutes- clear fluid with some brown sediment noted in the toilet  Ileostomy intact

## 2012-03-13 NOTE — Progress Notes (Signed)
The transporter told me that the patient felt nauseated upon getting into her car.   She stated strongly to the transporter that she did "not want to go back up there.   I will be fine at home."   Her husband agreed at that time, so the transporter put her in the car.

## 2012-03-13 NOTE — Progress Notes (Signed)
Patient did not have preoperative order for IV antibiotic SSI prophylaxis. (G8918)  Patient did not experience any of the following events: a burn prior to discharge; a fall within the facility; wrong site/side/patient/procedure/implant event; or a hospital transfer or hospital admission upon discharge from the facility. (G8907)  

## 2012-03-13 NOTE — Op Note (Signed)
[facility] [address] [place], [place]  [address]  COLONOSCOPY PROCEDURE REPORT  PATIENT:  [name], [name]  MR#:  [id number] BIRTHDATE:  1946/08/05, 65 yrs. old  GENDER:  female ENDOSCOPIST:  [name], MD PROCEDURE DATE:  03/13/2012 PROCEDURE:  Colonoscopy 19147 ASA CLASS:  Class II INDICATIONS:  Rectal adenocarcinoma diagnosed July 21012 (Dr. 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 She is tatus post concurrent Xeloda with radiation therapy from 05/30/2011 through 07/07/2011. Status post laparoscopic LAR with diverting loop ileostomy (Dr. 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 Adjuvant chemo (Dr. Dalene Carrow) started March 2013. MEDICATIONS:  Fentanyl 75 mcg IV, These medications were titrated to patient response per physician's verbal order, Versed 7 mg IV  DESCRIPTION OF PROCEDURE:   After the risks benefits and alternatives of the procedure were thoroughly explained, informed consent was obtained.  Digital rectal exam was performed and revealed no rectal masses.   The LB [id number]-[id number] endoscope was introduced through the anus and advanced to the cecum, which was identified by both the appendix and ileocecal valve, without limitations.  The quality of the prep was poor..  The instrument was then slowly withdrawn as the colon was fully examined. <<PROCEDUREIMAGES>> FINDINGS:  The LAR anastomosis was normal appearing, located 6-7cm from anal verge. The mucosa throughout most of the colon was friable, slightly inflamed. The prep was poor and so complete visualization of the mucosa was not possible (see image7, image6, image5, image4, image3, image2, and image8).   Retroflexed views in the rectum revealed it was not tolerated by the patient due to small rectal vault. COMPLICATIONS:  None ENDOSCOPIC IMPRESSION: 1)  Mild diversion colitis 2) Normal appearing LAR anastomosis at 6-7cm from anus  RECOMMENDATIONS: Ok to proceed with ileostomy takedown if Barium Enema and CT scan are OK (already set up by Dr. Donell Beers). Given poor prep (could only use enemas given ileostomy), will plan to repeat this examination in 12 months.  REPEAT EXAM:  1 year  ______________________________ [name], MD  cc: [name], MD; Arlan Organ, MD  n. eSIGNED:   [name] at 03/13/2012 01:48 PM  Ihor Dow, [phone]

## 2012-03-14 ENCOUNTER — Ambulatory Visit (HOSPITAL_COMMUNITY)
Admission: RE | Admit: 2012-03-14 | Discharge: 2012-03-14 | Disposition: A | Discharge status: Home / Self Care | Payer: Medicare Other | Source: Ambulatory Visit | Attending: General Surgery | Admitting: General Surgery

## 2012-03-14 ENCOUNTER — Encounter (INDEPENDENT_AMBULATORY_CARE_PROVIDER_SITE_OTHER): Payer: Medicare Other | Admitting: General Surgery

## 2012-03-14 ENCOUNTER — Telehealth: Payer: Self-pay | Admitting: *Deleted

## 2012-03-14 DIAGNOSIS — K5730 Diverticulosis of large intestine without perforation or abscess without bleeding: Secondary | ICD-10-CM | POA: Insufficient documentation

## 2012-03-14 DIAGNOSIS — C20 Malignant neoplasm of rectum: Secondary | ICD-10-CM

## 2012-03-14 DIAGNOSIS — Z9889 Other specified postprocedural states: Secondary | ICD-10-CM | POA: Insufficient documentation

## 2012-03-14 MED ORDER — IOHEXOL 300 MG/ML  SOLN
450.0000 mL | Freq: Once | INTRAMUSCULAR | Status: AC | PRN
  Administered 2012-03-14: 450 mL via INTRAVENOUS

## 2012-03-14 NOTE — Telephone Encounter (Signed)
Message left

## 2012-03-15 ENCOUNTER — Ambulatory Visit (HOSPITAL_COMMUNITY)
Admission: RE | Admit: 2012-03-15 | Discharge: 2012-03-15 | Disposition: A | Discharge status: Home / Self Care | Payer: Medicare Other | Source: Ambulatory Visit | Attending: Hematology and Oncology | Admitting: Hematology and Oncology

## 2012-03-15 ENCOUNTER — Other Ambulatory Visit (HOSPITAL_BASED_OUTPATIENT_CLINIC_OR_DEPARTMENT_OTHER): Payer: Medicare Other | Admitting: Lab

## 2012-03-15 DIAGNOSIS — Z9221 Personal history of antineoplastic chemotherapy: Secondary | ICD-10-CM | POA: Insufficient documentation

## 2012-03-15 DIAGNOSIS — Z923 Personal history of irradiation: Secondary | ICD-10-CM | POA: Insufficient documentation

## 2012-03-15 DIAGNOSIS — Z980 Intestinal bypass and anastomosis status: Secondary | ICD-10-CM | POA: Insufficient documentation

## 2012-03-15 DIAGNOSIS — C20 Malignant neoplasm of rectum: Secondary | ICD-10-CM

## 2012-03-15 DIAGNOSIS — Z981 Arthrodesis status: Secondary | ICD-10-CM | POA: Insufficient documentation

## 2012-03-15 DIAGNOSIS — Z932 Ileostomy status: Secondary | ICD-10-CM | POA: Insufficient documentation

## 2012-03-15 LAB — CBC WITH DIFFERENTIAL/PLATELET
BASO%: 1.1 % (ref 0.0–2.0)
Basophils Absolute: 0 10*3/uL (ref 0.0–0.1)
EOS%: 3.5 % (ref 0.0–7.0)
Eosinophils Absolute: 0.2 10*3/uL (ref 0.0–0.5)
HCT: 36.1 % (ref 34.8–46.6)
HGB: 12.5 g/dL (ref 11.6–15.9)
LYMPH%: 11.1 % — ABNORMAL LOW (ref 14.0–49.7)
MCH: 37.8 pg — ABNORMAL HIGH (ref 25.1–34.0)
MCHC: 34.5 g/dL (ref 31.5–36.0)
MCV: 109.7 fL — ABNORMAL HIGH (ref 79.5–101.0)
MONO#: 0.5 10*3/uL (ref 0.1–0.9)
MONO%: 11.6 % (ref 0.0–14.0)
NEUT#: 3.2 10*3/uL (ref 1.5–6.5)
NEUT%: 72.7 % (ref 38.4–76.8)
Platelets: 143 10*3/uL — ABNORMAL LOW (ref 145–400)
RBC: 3.29 10*6/uL — ABNORMAL LOW (ref 3.70–5.45)
RDW: 18 % — ABNORMAL HIGH (ref 11.2–14.5)
WBC: 4.4 10*3/uL (ref 3.9–10.3)
lymph#: 0.5 10*3/uL — ABNORMAL LOW (ref 0.9–3.3)
nRBC: 0 % (ref 0–0)

## 2012-03-15 LAB — CMP (CANCER CENTER ONLY)
ALT(SGPT): 23 U/L (ref 10–47)
AST: 26 U/L (ref 11–38)
Albumin: 3.8 g/dL (ref 3.3–5.5)
Alkaline Phosphatase: 65 U/L (ref 26–84)
BUN, Bld: 18 mg/dL (ref 7–22)
CO2: 29 mEq/L (ref 18–33)
Calcium: 9 mg/dL (ref 8.0–10.3)
Chloride: 103 mEq/L (ref 98–108)
Creat: 1 mg/dl (ref 0.6–1.2)
Glucose, Bld: 105 mg/dL (ref 73–118)
Potassium: 4.6 mEq/L (ref 3.3–4.7)
Sodium: 142 mEq/L (ref 128–145)
Total Bilirubin: 1 mg/dl (ref 0.20–1.60)
Total Protein: 6.9 g/dL (ref 6.4–8.1)

## 2012-03-15 LAB — CEA: CEA: <0.5 ng/mL (ref 0.0–5.0)

## 2012-03-15 MED ORDER — IOHEXOL 300 MG/ML  SOLN
100.0000 mL | Freq: Once | INTRAMUSCULAR | Status: AC | PRN
  Administered 2012-03-15: 100 mL via INTRAVENOUS

## 2012-03-16 ENCOUNTER — Encounter: Payer: Self-pay | Admitting: Hematology and Oncology

## 2012-03-16 ENCOUNTER — Ambulatory Visit (HOSPITAL_BASED_OUTPATIENT_CLINIC_OR_DEPARTMENT_OTHER): Payer: Medicare Other | Admitting: Hematology and Oncology

## 2012-03-16 VITALS — BP 134/65 | HR 80 | Temp 97.7°F | Ht 68.0 in | Wt 214.6 lb

## 2012-03-16 DIAGNOSIS — C20 Malignant neoplasm of rectum: Secondary | ICD-10-CM

## 2012-03-16 NOTE — Patient Instructions (Signed)
[name]  [id number]  [facility] Discharge Instructions  RECOMMENDATIONS MADE BY THE CONSULTANT AND ANY TEST RESULTS WILL BE SENT TO YOUR REFERRING DOCTOR.   EXAM FINDINGS BY MD TODAY AND SIGNS AND SYMPTOMS TO REPORT TO CLINIC OR PRIMARY MD:   Your current list of medications are: Current Outpatient Prescriptions  Medication Sig Dispense Refill  . albuterol (VENTOLIN HFA) 108 (90 BASE) MCG/ACT inhaler Inhale 2 puffs into the lungs every 6 (six) hours as needed for wheezing.  1 Inhaler  1  . aspirin 81 MG tablet Take 81 mg by mouth at bedtime.       . Calcium Carbonate-Vitamin D (CALCIUM 600 + D PO) Take 600 mg by mouth at bedtime.       Marland Kitchen diltiazem (CARDIZEM CD) 120 MG 24 hr capsule Take 120 mg by mouth at bedtime.       . diphenoxylate-atropine (LOMOTIL) 2.5-0.025 MG per tablet 1 tablet as needed.       . Fluticasone-Salmeterol (ADVAIR DISKUS) 100-50 MCG/DOSE AEPB Inhale 1 puff into the lungs 2 (two) times daily.  60 each  9  . losartan (COZAAR) 50 MG tablet daily.      Marland Kitchen lovastatin (MEVACOR) 40 MG tablet take 1 tablet by mouth once daily  30 tablet  3  . montelukast (SINGULAIR) 10 MG tablet Take 1 tablet (10 mg total) by mouth at bedtime.  30 tablet  9  . nystatin (MYCOSTATIN) powder Apply 1 Units topically daily. Applies to skin folds or around stoma as needed for fungal infection.      Marland Kitchen oxyCODONE-acetaminophen (PERCOCET) 5-325 MG per tablet Take 1 tablet by mouth as needed.       . pantoprazole (PROTONIX) 40 MG tablet Take 40 mg by mouth daily.       . prochlorperazine (COMPAZINE) 10 MG tablet Take 10 mg by mouth every 6 (six) hours as needed. NAUSEA      . tiotropium (SPIRIVA HANDIHALER) 18 MCG inhalation capsule Place 1 capsule (18 mcg total) into inhaler and inhale daily.  30 capsule  9     INSTRUCTIONS GIVEN AND DISCUSSED:   SPECIAL INSTRUCTIONS/FOLLOW-UP:  See above.  I acknowledge that I have been informed and understand all the instructions given  to me and received a copy. I do not have any more questions at this time, but understand that I may call the [facility] at [phone] during business hours should I have any further questions or need assistance in obtaining follow-up care.

## 2012-03-16 NOTE — Progress Notes (Signed)
This office note has been dictated.

## 2012-03-16 NOTE — Progress Notes (Signed)
CC:   Rosalyn Gess. Norins, MD [name], MD [name], [name], [name] [name], MD  IDENTIFYING STATEMENT:  The patient is a 66 year old woman with rectal cancer who presents for followup.  INTERVAL HISTORY:  Ms. Funches completed a staging CT scan of the chest, abdomen and pelvis on 03/15/2012.  Those results showed no evidence of recurrent disease.  There was some presacral thickening adjacent to distal rectal anastomosis but this area remained stable with no evidence of local progression.  Dr. Christella Hartigan performed a colonoscopy on 03/14/2012. Those results were also unremarkable.  The patient feels well.  She is looking forward to having her colostomy reversed.  She is in good spirits.  MEDICATIONS:  Reviewed and updated.  ALLERGIES:  Sulfa, codeine and prednisone.  Past medical history, family history, social history unchanged.  REVIEW OF SYSTEMS:  Ten point review of systems negative.  PHYSICAL EXAMINATION:  General:  The patient is a well-appearing, well- nourished, woman in no distress.  Vitals:  Pulse 80, blood pressure 134/65, temperature 97.7, respirations 18, weight 214.6 pounds.  HEENT: Head is atraumatic, normocephalic.  Sclerae anicteric.  Mouth is moist. Neck:  Supple.  Chest:  Clear.  CVS:  Unremarkable.  Abdomen:  Soft. Bowel sounds present.  Colostomy evident with semiformed stools. Extremities:  No calf tenderness.  LAB DATA:  On 03/15/2012 white cell count 4.4, hemoglobin 12.5, hematocrit 36.1, platelets 143.  Sodium 142, potassium 4.6, chloride 103, CO2 29, BUN 18, glucose 105, creatinine 1, T bilirubin 1, alkaline phosphatase 65, AST 26, ALT 23, calcium 9.  Results of CT as in interval history.  IMPRESSION AND PLAN:  Ms. Mcglothlin is a 66 year old woman with a uT3 N1 low lying rectal invasive adenocarcinoma diagnosed in August 2012.  She is status post concurrent Xeloda with radiation therapy from 05/30/2011 through 07/07/2011.  She is status post  partial colectomy on 08/15/2011 revealing a T3 N0 (0 of 7 positive lymph nodes) well-differentiated mucinous adenocarcinoma of the rectum.  Completed adjuvant Xeloda therapy from 01/30/2012 through 03/05/2012.  Followup CTs showed no evidence of recurrence.  The patient will follow up with Dr. Donell Beers. She follows up with me in 3-4 months' time with labs and surveillance scans.    ______________________________ Laurice Record, [name] LIO/MEDQ  D:  03/16/2012  T:  03/16/2012  Job:  [id number]

## 2012-03-20 ENCOUNTER — Telehealth (INDEPENDENT_AMBULATORY_CARE_PROVIDER_SITE_OTHER): Payer: Self-pay

## 2012-03-20 ENCOUNTER — Encounter (HOSPITAL_COMMUNITY): Payer: Self-pay | Admitting: Pharmacy Technician

## 2012-03-20 NOTE — Telephone Encounter (Signed)
Pt made aware of normal lab results 

## 2012-03-26 ENCOUNTER — Encounter (HOSPITAL_COMMUNITY): Payer: Self-pay

## 2012-03-26 ENCOUNTER — Encounter (HOSPITAL_COMMUNITY)
Admission: RE | Admit: 2012-03-26 | Discharge: 2012-03-26 | Disposition: A | Discharge status: Home / Self Care | Payer: Medicare Other | Source: Ambulatory Visit | Attending: General Surgery | Admitting: General Surgery

## 2012-03-26 ENCOUNTER — Telehealth (INDEPENDENT_AMBULATORY_CARE_PROVIDER_SITE_OTHER): Payer: Self-pay | Admitting: General Surgery

## 2012-03-26 LAB — URINALYSIS, ROUTINE W REFLEX MICROSCOPIC
Bilirubin Urine: NEGATIVE
Glucose, UA: NEGATIVE mg/dL
Hgb urine dipstick: NEGATIVE
Ketones, ur: NEGATIVE mg/dL
Nitrite: NEGATIVE
Protein, ur: NEGATIVE mg/dL
Specific Gravity, Urine: 1.029 (ref 1.005–1.030)
Urobilinogen, UA: 0.2 mg/dL (ref 0.0–1.0)
pH: 5 (ref 5.0–8.0)

## 2012-03-26 LAB — CBC
HCT: 37.3 % (ref 36.0–46.0)
Hemoglobin: 12.5 g/dL (ref 12.0–15.0)
MCH: 36.2 pg — ABNORMAL HIGH (ref 26.0–34.0)
MCHC: 33.5 g/dL (ref 30.0–36.0)
MCV: 108.1 fL — ABNORMAL HIGH (ref 78.0–100.0)
Platelets: 144 10*3/uL — ABNORMAL LOW (ref 150–400)
RBC: 3.45 MIL/uL — ABNORMAL LOW (ref 3.87–5.11)
RDW: 15 % (ref 11.5–15.5)
WBC: 6.1 10*3/uL (ref 4.0–10.5)

## 2012-03-26 LAB — SURGICAL PCR SCREEN
MRSA, PCR: NEGATIVE
Staphylococcus aureus: NEGATIVE

## 2012-03-26 LAB — URINE MICROSCOPIC-ADD ON

## 2012-03-26 NOTE — Telephone Encounter (Signed)
Pt calling to ask if bowel prep is needed for her ileostomy takedown surgery.  Paged Dr. Donell Beers to ask and was told no prep needed; pt updated with same.

## 2012-03-26 NOTE — Patient Instructions (Signed)
20 [name]  03/26/2012   Your procedure is scheduled on:  04/02/12 0730am-0930am  Report to [facility] at 0530 AM.  Call this number if you have problems the morning of surgery: [phone]   Remember:   Do not eat food:After Midnight.  May have clear liquids:until Midnight .    Take these medicines the morning of surgery with A SIP OF WATER:   Do not wear jewelry, make-up or nail polish.  Do not wear lotions, powders, or perfumes.  Do not shave 48 hours prior to surgery.   Do not bring valuables to the hospital.  Contacts, dentures or bridgework may not be worn into surgery.  Leave suitcase in the car. After surgery it may be brought to your room.  For patients admitted to the hospital, checkout time is 11:00 AM the day of discharge.      Special Instructions: CHG Shower Use Special Wash: 1/2 bottle night before surgery and 1/2 bottle morning of surgery. shower chin to toes with CHG.  Wash face and private parts with regular soap.     Please read over the following fact sheets that you were given: MRSA Information, coughing and deep breathing exercises

## 2012-03-26 NOTE — Pre-Procedure Instructions (Signed)
03/26/12 Patient has Montgomery straps on abdomen

## 2012-03-26 NOTE — Pre-Procedure Instructions (Signed)
CMET done 03/15/12 in [facility]  01/30/12 EKG on chart  01/30/12 LOV- Dr Alanda Amass- note on chart  ECHO 02/07/12 on chart  Myoview done 04/06/11 on chart

## 2012-03-27 ENCOUNTER — Telehealth (INDEPENDENT_AMBULATORY_CARE_PROVIDER_SITE_OTHER): Payer: Self-pay | Admitting: General Surgery

## 2012-03-27 ENCOUNTER — Other Ambulatory Visit (INDEPENDENT_AMBULATORY_CARE_PROVIDER_SITE_OTHER): Payer: Self-pay | Admitting: General Surgery

## 2012-03-27 DIAGNOSIS — N390 Urinary tract infection, site not specified: Secondary | ICD-10-CM

## 2012-03-27 MED ORDER — CIPROFLOXACIN HCL 750 MG PO TABS: ORAL_TABLET | ORAL | Status: DC

## 2012-03-27 NOTE — Telephone Encounter (Signed)
Called rx to [facility] [phone] notified the patient    [name] has surgery Monday. She looks like she has a mild UTI on her labs. She needs a 3 day course of cipro 750 mg po BID no refills   Tx.   FB      ----- Message -----   From: Monia Pouch, RN   Sent: 03/26/2012 11:51 AM   To: [name], MD

## 2012-04-02 ENCOUNTER — Encounter (HOSPITAL_COMMUNITY): Payer: Self-pay

## 2012-04-02 ENCOUNTER — Encounter (HOSPITAL_COMMUNITY)
Admission: RE | Disposition: A | Discharge status: Home / Self Care | Payer: Self-pay | Source: Ambulatory Visit | Attending: General Surgery

## 2012-04-02 ENCOUNTER — Inpatient Hospital Stay (HOSPITAL_COMMUNITY)
Admission: RE | Admit: 2012-04-02 | Discharge: 2012-04-08 | DRG: 330 | Disposition: A | Discharge status: Home Health | Payer: Medicare Other | Source: Ambulatory Visit | Attending: General Surgery | Admitting: General Surgery

## 2012-04-02 ENCOUNTER — Encounter (HOSPITAL_COMMUNITY): Payer: Self-pay | Admitting: Anesthesiology

## 2012-04-02 ENCOUNTER — Ambulatory Visit (HOSPITAL_COMMUNITY): Payer: Medicare Other | Admitting: Anesthesiology

## 2012-04-02 DIAGNOSIS — K219 Gastro-esophageal reflux disease without esophagitis: Secondary | ICD-10-CM | POA: Diagnosis present

## 2012-04-02 DIAGNOSIS — C20 Malignant neoplasm of rectum: Secondary | ICD-10-CM

## 2012-04-02 DIAGNOSIS — Z432 Encounter for attention to ileostomy: Principal | ICD-10-CM

## 2012-04-02 DIAGNOSIS — Z01812 Encounter for preprocedural laboratory examination: Secondary | ICD-10-CM

## 2012-04-02 DIAGNOSIS — E669 Obesity, unspecified: Secondary | ICD-10-CM | POA: Diagnosis present

## 2012-04-02 DIAGNOSIS — J449 Chronic obstructive pulmonary disease, unspecified: Secondary | ICD-10-CM | POA: Diagnosis present

## 2012-04-02 DIAGNOSIS — Z85048 Personal history of other malignant neoplasm of rectum, rectosigmoid junction, and anus: Secondary | ICD-10-CM

## 2012-04-02 DIAGNOSIS — N390 Urinary tract infection, site not specified: Secondary | ICD-10-CM

## 2012-04-02 DIAGNOSIS — G4730 Sleep apnea, unspecified: Secondary | ICD-10-CM | POA: Diagnosis present

## 2012-04-02 DIAGNOSIS — J4489 Other specified chronic obstructive pulmonary disease: Secondary | ICD-10-CM | POA: Diagnosis present

## 2012-04-02 DIAGNOSIS — K560 Paralytic ileus: Secondary | ICD-10-CM | POA: Diagnosis not present

## 2012-04-02 DIAGNOSIS — I10 Essential (primary) hypertension: Secondary | ICD-10-CM | POA: Diagnosis present

## 2012-04-02 HISTORY — PX: BOWEL RESECTION: SHX1257

## 2012-04-02 HISTORY — PX: ILEOSTOMY CLOSURE: SHX1784

## 2012-04-02 LAB — CBC
HCT: 34.1 % — ABNORMAL LOW (ref 36.0–46.0)
Hemoglobin: 11.5 g/dL — ABNORMAL LOW (ref 12.0–15.0)
MCH: 35.8 pg — ABNORMAL HIGH (ref 26.0–34.0)
MCHC: 33.7 g/dL (ref 30.0–36.0)
MCV: 106.2 fL — ABNORMAL HIGH (ref 78.0–100.0)
Platelets: 135 10*3/uL — ABNORMAL LOW (ref 150–400)
RBC: 3.21 MIL/uL — ABNORMAL LOW (ref 3.87–5.11)
RDW: 14.2 % (ref 11.5–15.5)
WBC: 8.1 10*3/uL (ref 4.0–10.5)

## 2012-04-02 LAB — CREATININE, SERUM
Creatinine, Ser: 0.96 mg/dL (ref 0.50–1.10)
GFR calc Af Amer: 70 mL/min — ABNORMAL LOW (ref >90)
GFR calc non Af Amer: 60 mL/min — ABNORMAL LOW (ref >90)

## 2012-04-02 SURGERY — CLOSURE, ILEOSTOMY
Anesthesia: General | Site: Abdomen | Wound class: Contaminated

## 2012-04-02 MED ORDER — TIOTROPIUM BROMIDE MONOHYDRATE 18 MCG IN CAPS
18.0000 ug | ORAL_CAPSULE | Freq: Every day | RESPIRATORY_TRACT | Status: DC
  Administered 2012-04-03 – 2012-04-07 (×5): 18 ug via RESPIRATORY_TRACT
  Filled 2012-04-02 (×2): qty 5

## 2012-04-02 MED ORDER — ALVIMOPAN 12 MG PO CAPS
12.0000 mg | ORAL_CAPSULE | Freq: Once | ORAL | Status: AC
  Administered 2012-04-02: 12 mg via ORAL

## 2012-04-02 MED ORDER — DIPHENHYDRAMINE HCL 12.5 MG/5ML PO ELIX
12.5000 mg | ORAL_SOLUTION | Freq: Four times a day (QID) | ORAL | Status: DC | PRN
  Filled 2012-04-02: qty 5

## 2012-04-02 MED ORDER — ETOMIDATE 2 MG/ML IV SOLN
INTRAVENOUS | Status: DC | PRN
  Administered 2012-04-02: 16 mg via INTRAVENOUS

## 2012-04-02 MED ORDER — METRONIDAZOLE IN NACL 5-0.79 MG/ML-% IV SOLN
500.0000 mg | Freq: Once | INTRAVENOUS | Status: AC
  Administered 2012-04-02: 500 mg via INTRAVENOUS
  Filled 2012-04-02: qty 100

## 2012-04-02 MED ORDER — HYDROMORPHONE HCL PF 1 MG/ML IJ SOLN
INTRAMUSCULAR | Status: AC
  Filled 2012-04-02: qty 1

## 2012-04-02 MED ORDER — NALOXONE HCL 0.4 MG/ML IJ SOLN: 0.4000 mg | INTRAMUSCULAR | Status: DC | PRN

## 2012-04-02 MED ORDER — PROMETHAZINE HCL 25 MG/ML IJ SOLN: 6.2500 mg | INTRAMUSCULAR | Status: DC | PRN

## 2012-04-02 MED ORDER — DEXAMETHASONE SODIUM PHOSPHATE 10 MG/ML IJ SOLN
INTRAMUSCULAR | Status: DC | PRN
  Administered 2012-04-02: 10 mg via INTRAVENOUS

## 2012-04-02 MED ORDER — ONDANSETRON HCL 4 MG/2ML IJ SOLN
INTRAMUSCULAR | Status: DC | PRN
  Administered 2012-04-02: 4 mg via INTRAVENOUS

## 2012-04-02 MED ORDER — SODIUM CHLORIDE 0.9 % IJ SOLN: 9.0000 mL | INTRAMUSCULAR | Status: DC | PRN

## 2012-04-02 MED ORDER — DILTIAZEM HCL ER COATED BEADS 120 MG PO CP24
120.0000 mg | ORAL_CAPSULE | Freq: Every day | ORAL | Status: DC
  Administered 2012-04-02 – 2012-04-07 (×6): 120 mg via ORAL
  Filled 2012-04-02 (×7): qty 1

## 2012-04-02 MED ORDER — ONDANSETRON HCL 4 MG/2ML IJ SOLN
4.0000 mg | Freq: Four times a day (QID) | INTRAMUSCULAR | Status: DC | PRN
  Administered 2012-04-02 – 2012-04-05 (×2): 4 mg via INTRAVENOUS
  Filled 2012-04-02: qty 2

## 2012-04-02 MED ORDER — FLUTICASONE-SALMETEROL 100-50 MCG/DOSE IN AEPB
1.0000 | INHALATION_SPRAY | Freq: Two times a day (BID) | RESPIRATORY_TRACT | Status: DC
  Administered 2012-04-02 – 2012-04-07 (×11): 1 via RESPIRATORY_TRACT
  Filled 2012-04-02: qty 14

## 2012-04-02 MED ORDER — MONTELUKAST SODIUM 10 MG PO TABS
10.0000 mg | ORAL_TABLET | Freq: Every day | ORAL | Status: DC
  Administered 2012-04-02 – 2012-04-07 (×6): 10 mg via ORAL
  Filled 2012-04-02 (×7): qty 1

## 2012-04-02 MED ORDER — MIDAZOLAM HCL 5 MG/5ML IJ SOLN
INTRAMUSCULAR | Status: DC | PRN
  Administered 2012-04-02: 2 mg via INTRAVENOUS

## 2012-04-02 MED ORDER — CEFOXITIN SODIUM-DEXTROSE 1-4 GM-% IV SOLR (PREMIX)
INTRAVENOUS | Status: AC
  Filled 2012-04-02: qty 100

## 2012-04-02 MED ORDER — ACETAMINOPHEN 10 MG/ML IV SOLN
1000.0000 mg | Freq: Four times a day (QID) | INTRAVENOUS | Status: AC
  Administered 2012-04-02 – 2012-04-03 (×3): 1000 mg via INTRAVENOUS
  Filled 2012-04-02 (×3): qty 100

## 2012-04-02 MED ORDER — BUPIVACAINE-EPINEPHRINE PF 0.25-1:200000 % IJ SOLN
INTRAMUSCULAR | Status: DC | PRN
  Administered 2012-04-02: 20 mL

## 2012-04-02 MED ORDER — 0.9 % SODIUM CHLORIDE (POUR BTL) OPTIME
TOPICAL | Status: DC | PRN
  Administered 2012-04-02: 2000 mL

## 2012-04-02 MED ORDER — FENTANYL CITRATE 0.05 MG/ML IJ SOLN
INTRAMUSCULAR | Status: DC | PRN
  Administered 2012-04-02 (×3): 25 ug via INTRAVENOUS
  Administered 2012-04-02: 50 ug via INTRAVENOUS
  Administered 2012-04-02: 100 ug via INTRAVENOUS
  Administered 2012-04-02: 25 ug via INTRAVENOUS

## 2012-04-02 MED ORDER — KCL IN DEXTROSE-NACL 20-5-0.45 MEQ/L-%-% IV SOLN
INTRAVENOUS | Status: DC
  Administered 2012-04-02 – 2012-04-04 (×5): via INTRAVENOUS
  Administered 2012-04-04: 75 mL via INTRAVENOUS
  Filled 2012-04-02 (×9): qty 1000

## 2012-04-02 MED ORDER — ENOXAPARIN SODIUM 40 MG/0.4ML ~~LOC~~ SOLN
40.0000 mg | SUBCUTANEOUS | Status: DC
  Administered 2012-04-03 – 2012-04-07 (×5): 40 mg via SUBCUTANEOUS
  Filled 2012-04-02 (×7): qty 0.4

## 2012-04-02 MED ORDER — MORPHINE SULFATE (PF) 1 MG/ML IV SOLN
INTRAVENOUS | Status: DC
  Administered 2012-04-02: 1.5 mg via INTRAVENOUS
  Administered 2012-04-02: 10:00:00 via INTRAVENOUS
  Administered 2012-04-02 (×2): 1.5 mg via INTRAVENOUS
  Administered 2012-04-03: 3 mg via INTRAVENOUS
  Administered 2012-04-03: 4.5 mg via INTRAVENOUS

## 2012-04-02 MED ORDER — ALVIMOPAN 12 MG PO CAPS
12.0000 mg | ORAL_CAPSULE | Freq: Two times a day (BID) | ORAL | Status: DC
  Administered 2012-04-03 – 2012-04-04 (×3): 12 mg via ORAL
  Filled 2012-04-02 (×4): qty 1

## 2012-04-02 MED ORDER — ROCURONIUM BROMIDE 100 MG/10ML IV SOLN: INTRAVENOUS | Status: DC | PRN

## 2012-04-02 MED ORDER — PANTOPRAZOLE SODIUM 40 MG IV SOLR
40.0000 mg | INTRAVENOUS | Status: DC
  Administered 2012-04-02 – 2012-04-06 (×5): 40 mg via INTRAVENOUS
  Filled 2012-04-02 (×6): qty 40

## 2012-04-02 MED ORDER — ALBUTEROL SULFATE HFA 108 (90 BASE) MCG/ACT IN AERS
2.0000 | INHALATION_SPRAY | Freq: Four times a day (QID) | RESPIRATORY_TRACT | Status: DC | PRN
  Filled 2012-04-02: qty 6.7

## 2012-04-02 MED ORDER — ROCURONIUM BROMIDE 100 MG/10ML IV SOLN
INTRAVENOUS | Status: DC | PRN
  Administered 2012-04-02: 50 mg via INTRAVENOUS
  Administered 2012-04-02 (×2): 10 mg via INTRAVENOUS

## 2012-04-02 MED ORDER — LOSARTAN POTASSIUM 50 MG PO TABS
50.0000 mg | ORAL_TABLET | Freq: Every day | ORAL | Status: DC
  Administered 2012-04-02 – 2012-04-07 (×6): 50 mg via ORAL
  Filled 2012-04-02 (×7): qty 1

## 2012-04-02 MED ORDER — DEXTROSE 5 % IV SOLN
2.0000 g | INTRAVENOUS | Status: AC
  Administered 2012-04-02: 2 g via INTRAVENOUS
  Filled 2012-04-02: qty 2

## 2012-04-02 MED ORDER — DEXTROSE 5 % IV SOLN
1.0000 g | Freq: Four times a day (QID) | INTRAVENOUS | Status: AC
  Administered 2012-04-02 – 2012-04-03 (×3): 1 g via INTRAVENOUS
  Filled 2012-04-02 (×3): qty 1

## 2012-04-02 MED ORDER — GLYCOPYRROLATE 0.2 MG/ML IJ SOLN
INTRAMUSCULAR | Status: DC | PRN
  Administered 2012-04-02: .5 mg via INTRAVENOUS

## 2012-04-02 MED ORDER — ONDANSETRON HCL 4 MG PO TABS
4.0000 mg | ORAL_TABLET | Freq: Four times a day (QID) | ORAL | Status: DC | PRN
  Administered 2012-04-06: 4 mg via ORAL
  Filled 2012-04-02: qty 1

## 2012-04-02 MED ORDER — METRONIDAZOLE IN NACL 5-0.79 MG/ML-% IV SOLN
INTRAVENOUS | Status: AC
  Filled 2012-04-02: qty 100

## 2012-04-02 MED ORDER — DIPHENHYDRAMINE HCL 50 MG/ML IJ SOLN: 12.5000 mg | Freq: Four times a day (QID) | INTRAMUSCULAR | Status: DC | PRN

## 2012-04-02 MED ORDER — HYDROMORPHONE HCL PF 1 MG/ML IJ SOLN
0.2500 mg | INTRAMUSCULAR | Status: DC | PRN
  Administered 2012-04-02 (×4): 0.5 mg via INTRAVENOUS

## 2012-04-02 MED ORDER — MORPHINE SULFATE (PF) 1 MG/ML IV SOLN
INTRAVENOUS | Status: AC
  Filled 2012-04-02: qty 25

## 2012-04-02 MED ORDER — SIMVASTATIN 20 MG PO TABS
20.0000 mg | ORAL_TABLET | Freq: Every day | ORAL | Status: DC
  Administered 2012-04-02: 20 mg via ORAL
  Filled 2012-04-02 (×2): qty 1

## 2012-04-02 MED ORDER — BUPIVACAINE HCL (PF) 0.25 % IJ SOLN
INTRAMUSCULAR | Status: AC
  Filled 2012-04-02: qty 30

## 2012-04-02 MED ORDER — ONDANSETRON HCL 4 MG/2ML IJ SOLN
4.0000 mg | Freq: Four times a day (QID) | INTRAMUSCULAR | Status: DC | PRN
  Filled 2012-04-02: qty 2

## 2012-04-02 MED ORDER — NEOSTIGMINE METHYLSULFATE 1 MG/ML IJ SOLN
INTRAMUSCULAR | Status: DC | PRN
  Administered 2012-04-02: 4 mg via INTRAVENOUS

## 2012-04-02 MED ORDER — LACTATED RINGERS IV SOLN
INTRAVENOUS | Status: DC | PRN
  Administered 2012-04-02 (×2): via INTRAVENOUS

## 2012-04-02 MED ORDER — ALVIMOPAN 12 MG PO CAPS
ORAL_CAPSULE | ORAL | Status: AC
  Administered 2012-04-02: 12 mg via ORAL
  Filled 2012-04-02: qty 1

## 2012-04-02 MED ORDER — BUPIVACAINE-EPINEPHRINE PF 0.25-1:200000 % IJ SOLN
INTRAMUSCULAR | Status: AC
  Filled 2012-04-02: qty 30

## 2012-04-02 SURGICAL SUPPLY — 54 items
APPLICATOR COTTON TIP 6IN STRL (MISCELLANEOUS) IMPLANT
BLADE EXTENDED COATED 6.5IN (ELECTRODE) IMPLANT
BLADE HEX COATED 2.75 (ELECTRODE) ×2 IMPLANT
BLADE SURG SZ10 CARB STEEL (BLADE) ×2 IMPLANT
CANISTER SUCTION 2500CC (MISCELLANEOUS) ×2 IMPLANT
CHLORAPREP W/TINT 26ML (MISCELLANEOUS) ×2 IMPLANT
CLIP TI LARGE 6 (CLIP) IMPLANT
CLOTH BEACON ORANGE TIMEOUT ST (SAFETY) ×2 IMPLANT
COVER MAYO STAND STRL (DRAPES) ×2 IMPLANT
DRAPE LAPAROSCOPIC ABDOMINAL (DRAPES) ×2 IMPLANT
DRAPE LG THREE QUARTER DISP (DRAPES) IMPLANT
DRAPE WARM FLUID 44X44 (DRAPE) ×2 IMPLANT
DRSG AQUACEL AG ADV 3.5X 4 (GAUZE/BANDAGES/DRESSINGS) ×2 IMPLANT
DRSG PAD ABDOMINAL 8X10 ST (GAUZE/BANDAGES/DRESSINGS) IMPLANT
ELECT REM PT RETURN 9FT ADLT (ELECTROSURGICAL) ×2
ELECTRODE REM PT RTRN 9FT ADLT (ELECTROSURGICAL) ×1 IMPLANT
GLOVE BIOGEL PI IND STRL 7.0 (GLOVE) ×1 IMPLANT
GLOVE BIOGEL PI INDICATOR 7.0 (GLOVE) ×1
GLOVE ECLIPSE 8.0 STRL XLNG CF (GLOVE) ×2 IMPLANT
GLOVE INDICATOR 8.0 STRL GRN (GLOVE) ×2 IMPLANT
GOWN STRL NON-REIN LRG LVL3 (GOWN DISPOSABLE) ×2 IMPLANT
GOWN STRL REIN XL XLG (GOWN DISPOSABLE) ×4 IMPLANT
HAND ACTIVATED (MISCELLANEOUS) IMPLANT
KIT BASIN OR (CUSTOM PROCEDURE TRAY) ×2 IMPLANT
LEGGING LITHOTOMY PAIR STRL (DRAPES) IMPLANT
NEEDLE HYPO 25X1 1.5 SAFETY (NEEDLE) ×2 IMPLANT
NS IRRIG 1000ML POUR BTL (IV SOLUTION) ×4 IMPLANT
PACK GENERAL/GYN (CUSTOM PROCEDURE TRAY) ×2 IMPLANT
SPONGE GAUZE 4X4 12PLY (GAUZE/BANDAGES/DRESSINGS) ×2 IMPLANT
STAPLER GUN LINEAR PROX 60 (STAPLE) ×2 IMPLANT
STAPLER PROXIMATE 75MM BLUE (STAPLE) ×2 IMPLANT
STAPLER VISISTAT 35W (STAPLE) ×2 IMPLANT
SUCTION POOLE TIP (SUCTIONS) IMPLANT
SUT CHROMIC 2 0 SH (SUTURE) IMPLANT
SUT NOV 1 T60/GS (SUTURE) IMPLANT
SUT NOVA 1 T20/GS 25DT (SUTURE) IMPLANT
SUT NOVA NAB DX-16 0-1 5-0 T12 (SUTURE) ×6 IMPLANT
SUT NOVA T20/GS 25 (SUTURE) IMPLANT
SUT PDS AB 1 CTX 36 (SUTURE) IMPLANT
SUT PROLENE 2 0 KS (SUTURE) IMPLANT
SUT SILK 2 0 (SUTURE) ×1
SUT SILK 2 0 SH CR/8 (SUTURE) ×2 IMPLANT
SUT SILK 2 0SH CR/8 30 (SUTURE) IMPLANT
SUT SILK 2-0 18XBRD TIE 12 (SUTURE) ×1 IMPLANT
SUT SILK 2-0 30XBRD TIE 12 (SUTURE) IMPLANT
SUT SILK 3 0 (SUTURE)
SUT SILK 3 0 SH CR/8 (SUTURE) ×2 IMPLANT
SUT SILK 3-0 18XBRD TIE 12 (SUTURE) IMPLANT
SUT VIC AB 3-0 SH 18 (SUTURE) ×2 IMPLANT
SYR CONTROL 10ML LL (SYRINGE) ×2 IMPLANT
TOWEL OR 17X26 10 PK STRL BLUE (TOWEL DISPOSABLE) ×4 IMPLANT
TRAY FOLEY CATH 14FRSI W/METER (CATHETERS) ×2 IMPLANT
WATER STERILE IRR 1500ML POUR (IV SOLUTION) IMPLANT
YANKAUER SUCT BULB TIP NO VENT (SUCTIONS) ×2 IMPLANT

## 2012-04-02 NOTE — Transfer of Care (Signed)
Immediate Anesthesia Transfer of Care Note  Patient: [name]  Procedure(s) Performed: Procedure(s) (LRB): ILEOSTOMY TAKEDOWN (N/A) SMALL BOWEL RESECTION (N/A)  Patient Location: PACU  Anesthesia Type: General  Level of Consciousness: awake, alert  and patient cooperative  Airway & Oxygen Therapy: Patient Spontanous Breathing and Patient connected to face mask oxygen  Post-op Assessment: Report given to PACU RN and Post -op Vital signs reviewed and stable  Post vital signs: Reviewed and stable  Complications: No apparent anesthesia complications

## 2012-04-02 NOTE — Op Note (Signed)
PRE-OPERATIVE DIAGNOSIS: Rectal cancer  POST-OPERATIVE DIAGNOSIS:  Same  PROCEDURE:  Procedure(s): Ileostomy takedown, small bowel resection  SURGEON:  Surgeon(s): [name], MD  ANESTHESIA:   local and general  DRAINS: none   LOCAL MEDICATIONS USED:  MARCAINE     SPECIMEN:  Source of Specimen:  ileostomy  DISPOSITION OF SPECIMEN:  PATHOLOGY  COUNTS:  YES  PLAN OF CARE: Admit to inpatient   PATIENT DISPOSITION:  PACU - hemodynamically stable.   Procedure: Patient was identified in the holding area and taken to the operating room where she was placed supine on the operating room table. General anesthesia was induced and a Foley catheter was placed.  Her stoma bag was removed and the ileostomy was closed with 2-0 Prolene.  The patient's abdomen was prepped and draped in standard fashion. Timeout was performed according to the surgical safety checklist. When all was correct we continued.  The skin around the stoma site was incised with a #15 blade. The Allis was used to elevate the peristomal skin. Cautery was used to take down the peristomal fat.  The tonsil was used to identify the dissection plane between the bowel and the peristernal fat.  The muscle edge was identified and separated from the bowel.  Once the bowel was really separated from the muscle and the margins underneath the muscle were freed up, the bowel was dissected from the mesentery. A 2-0 silk stay suture was placed near the fascia on the bowel. The bowel was opened up with the cautery. The GIA-75 was used to create a side-to-side functional end-to-end small bowel anastomosis. The ileostomy site was resected with the TA 60. This also was used to close the defect in the anastomosis. A Tresa Endo was used to clamp the mesentery. A 2-0 silk suture was used to ligate the mesentery. The staple line was oversewn with 3-0 silk. This was allowed to fall back into the abdomen.  The muscle defect was closed with interrupted #1  Novafil pops sutures. There was no residual defect. Local anesthetic was injected into the muscle on the skin. The ileostomy site was irrigated copiously.  The edges of the ileostomy site were closed with the skin stapler.  A wick was placed with a 4x4.  The wound was cleaned, dried, and dressed with an Aquacel.    The patient was awakened from anesthesia and taken to the recovery room in stable condition.  Needle and sponge counts were correct x 2.

## 2012-04-02 NOTE — Progress Notes (Signed)
Patients husband has inhalers in belongings bag

## 2012-04-02 NOTE — Anesthesia Postprocedure Evaluation (Signed)
  Anesthesia Post-op Note  Patient: [name]  Procedure(s) Performed: Procedure(s) (LRB): ILEOSTOMY TAKEDOWN (N/A) SMALL BOWEL RESECTION (N/A)  Patient Location: PACU  Anesthesia Type: General  Level of Consciousness: awake and alert   Airway and Oxygen Therapy: Patient Spontanous Breathing  Post-op Pain: mild  Post-op Assessment: Post-op Vital signs reviewed, Patient's Cardiovascular Status Stable, Respiratory Function Stable, Patent Airway and No signs of Nausea or vomiting  Post-op Vital Signs: stable  Complications: No apparent anesthesia complications

## 2012-04-02 NOTE — H&P (Signed)
  HISTORY:  Pt is doing well status post laparoscopic low anterior resection with coloanal anastomosis and diverting ileostomy in November of 2012. She had a delayed wound infection. She had packing to this area. Her staging studies have been going well. She is due to finished chemotherapy at the end of May. Colonoscopy and scans were negative for recurrence.  . She has been doing much better and has not had issues with dehydration recently. Her ostomy has been functioning better. She does wear a Depends in case her ostomy leaks.   PERTINENT REVIEW OF SYSTEMS:  Otherwise negative.   EXAM:  Head: Normocephalic and atraumatic.  Eyes: Conjunctivae are normal. Pupils are equal, round, and reactive to light. No scleral icterus.  Neck: Normal range of motion. Neck supple. No tracheal deviation present. No thyromegaly present.  Resp: No respiratory distress, normal effort.  Abd: Abdomen is soft, non distended and non tender. No masses are palpable. There is no rebound and no guarding. Wearing ostomy belt. Stool pasty.  Rectal: Reasonable rectal tone, no stricture.  Neurological: Alert and oriented to person, place, and time. Coordination normal.  Skin: Skin is warm and dry. No rash noted. No diaphoretic. No erythema. No pallor.  Psychiatric: Normal mood and affect. Normal behavior. Judgment and thought content normal.   ASSESSMENT AND PLAN:  Rectal cancer, midrectum ypT3ypN0 (0/7 LN), s/p neoadj chemoXRT, lap LAR with diverting loop ileostomy  Pt done with chemo.   No sign of recurrence. Plan ileostomy takedown. Discussed risks of poor bowel function and sphincter tone with pt.    Discussed surgery.  Risks include bleeding, infection, damage to other structures, possible need to convert to open.   Maudry Diego, MD  Surgical Oncology, General & Endocrine Surgery  [facility]     Illene Regulus, MD, MD  Debby Bud Rosalyn Gess, MD

## 2012-04-02 NOTE — Anesthesia Preprocedure Evaluation (Addendum)
Anesthesia Evaluation  Patient identified by MRN, date of birth, ID band Patient awake    Reviewed: Allergy & Precautions, H&P , NPO status , Patient's Chart, lab work & pertinent test results  History of Anesthesia Complications (+) PONV  Airway Mallampati: II TM Distance: >3 FB Neck ROM: Full    Dental No notable dental hx.    Pulmonary shortness of breath, asthma , sleep apnea , pneumonia , COPD COPD inhaler,  breath sounds clear to auscultation  Pulmonary exam normal       Cardiovascular hypertension, Pt. on medications + Valvular Problems/Murmurs Rhythm:Regular Rate:Normal + Systolic murmurs 3/6 sys murmur. H/O severe aortic stenosis.   Neuro/Psych  Neuromuscular disease negative psych ROS   GI/Hepatic negative GI ROS, Neg liver ROS, GERD-  ,  Endo/Other  negative endocrine ROS  Renal/GU negative Renal ROS  negative genitourinary   Musculoskeletal negative musculoskeletal ROS (+)   Abdominal (+) + obese,   Peds negative pediatric ROS (+)  Hematology negative hematology ROS (+)   Anesthesia Other Findings   Reproductive/Obstetrics negative OB ROS                         Anesthesia Physical Anesthesia Plan  ASA: III  Anesthesia Plan: General   Post-op Pain Management:    Induction: Intravenous  Airway Management Planned: Oral ETT  Additional Equipment:   Intra-op Plan:   Post-operative Plan: Extubation in OR  Informed Consent: I have reviewed the patients History and Physical, chart, labs and discussed the procedure including the risks, benefits and alternatives for the proposed anesthesia with the patient or authorized representative who has indicated his/her understanding and acceptance.   Dental advisory given  Plan Discussed with: CRNA  Anesthesia Plan Comments: (Careful IV induction with h/o significant aortic stenosis.)       Anesthesia Quick Evaluation

## 2012-04-03 ENCOUNTER — Encounter (HOSPITAL_COMMUNITY): Payer: Self-pay | Admitting: General Surgery

## 2012-04-03 DIAGNOSIS — I10 Essential (primary) hypertension: Secondary | ICD-10-CM

## 2012-04-03 DIAGNOSIS — J449 Chronic obstructive pulmonary disease, unspecified: Secondary | ICD-10-CM

## 2012-04-03 LAB — BASIC METABOLIC PANEL
BUN: 17 mg/dL (ref 6–23)
CO2: 24 mEq/L (ref 19–32)
Calcium: 9 mg/dL (ref 8.4–10.5)
Chloride: 101 mEq/L (ref 96–112)
Creatinine, Ser: 0.98 mg/dL (ref 0.50–1.10)
GFR calc Af Amer: 68 mL/min — ABNORMAL LOW (ref >90)
GFR calc non Af Amer: 59 mL/min — ABNORMAL LOW (ref >90)
Glucose, Bld: 145 mg/dL — ABNORMAL HIGH (ref 70–99)
Potassium: 4.5 mEq/L (ref 3.5–5.1)
Sodium: 134 mEq/L — ABNORMAL LOW (ref 135–145)

## 2012-04-03 LAB — CBC
HCT: 33.8 % — ABNORMAL LOW (ref 36.0–46.0)
Hemoglobin: 11.6 g/dL — ABNORMAL LOW (ref 12.0–15.0)
MCH: 36.6 pg — ABNORMAL HIGH (ref 26.0–34.0)
MCHC: 34.3 g/dL (ref 30.0–36.0)
MCV: 106.6 fL — ABNORMAL HIGH (ref 78.0–100.0)
Platelets: 150 10*3/uL (ref 150–400)
RBC: 3.17 MIL/uL — ABNORMAL LOW (ref 3.87–5.11)
RDW: 14.1 % (ref 11.5–15.5)
WBC: 10 10*3/uL (ref 4.0–10.5)

## 2012-04-03 LAB — MAGNESIUM: Magnesium: 2 mg/dL (ref 1.5–2.5)

## 2012-04-03 LAB — PHOSPHORUS: Phosphorus: 4.1 mg/dL (ref 2.3–4.6)

## 2012-04-03 MED ORDER — MORPHINE SULFATE 2 MG/ML IJ SOLN
1.0000 mg | INTRAMUSCULAR | Status: DC | PRN
  Administered 2012-04-03 – 2012-04-06 (×6): 2 mg via INTRAVENOUS
  Filled 2012-04-03 (×6): qty 1

## 2012-04-03 MED ORDER — ATORVASTATIN CALCIUM 10 MG PO TABS
10.0000 mg | ORAL_TABLET | Freq: Every day | ORAL | Status: DC
  Administered 2012-04-03 – 2012-04-07 (×4): 10 mg via ORAL
  Filled 2012-04-03 (×7): qty 1

## 2012-04-03 NOTE — Progress Notes (Signed)
1 Day Post-Op  Subjective: Minimal pain.  No nausea or belching.  Complains of all the lines she is hooked up to.    Objective: Vital signs in last 24 hours: Temp:  [97.4 F (36.3 C)-98.3 F (36.8 C)] 97.4 F (36.3 C) (06/25 0825) Pulse Rate:  [59-85] 67  (06/25 0825) Resp:  [10-18] 13  (06/25 0800) BP: (91-127)/(44-64) 118/64 mmHg (06/25 0825) SpO2:  [90 %-100 %] 100 % (06/25 0836) FiO2 (%):  [36 %-42 %] 36 % (06/25 0405) Weight:  [219 lb 3.2 oz (99.428 kg)] 219 lb 3.2 oz (99.428 kg) (06/24 1350)    Intake/Output from previous day: 06/24 0701 - 06/25 0700 In: 3645 [I.V.:3345] Out: 700 [Urine:600; Blood:100] Intake/Output this shift:    General appearance: alert, cooperative and no distress GI: soft, non distended.  approp tender dressing moist.    Lab Results:   Basename 04/03/12 0410 04/02/12 1250  WBC 10.0 8.1  HGB 11.6* 11.5*  HCT 33.8* 34.1*  PLT 150 135*   BMET  Basename 04/03/12 0410 04/02/12 1250  NA 134* --  K 4.5 --  CL 101 --  CO2 24 --  GLUCOSE 145* --  BUN 17 --  CREATININE 0.98 0.96  CALCIUM 9.0 --   PT/INR No results found for this basename: LABPROT:2,INR:2 in the last 72 hours ABG No results found for this basename: PHART:2,PCO2:2,PO2:2,HCO3:2 in the last 72 hours  Studies/Results: No results found.  Anti-infectives: Anti-infectives     Start     Dose/Rate Route Frequency Ordered Stop   04/02/12 1400   cefOXitin (MEFOXIN) 1 g in dextrose 5 % 50 mL IVPB        1 g 100 mL/hr over 30 Minutes Intravenous Every 6 hours 04/02/12 1201 04/03/12 0234   04/02/12 0600   cefOXitin (MEFOXIN) 2 g in dextrose 5 % 50 mL IVPB        2 g 100 mL/hr over 30 Minutes Intravenous 60 min pre-op 04/02/12 0524 04/02/12 0810   04/02/12 0600   metroNIDAZOLE (FLAGYL) IVPB 500 mg        500 mg 100 mL/hr over 60 Minutes Intravenous  Once 04/02/12 0524 04/02/12 0819          Assessment/Plan: s/p Procedure(s) (LRB): ILEOSTOMY TAKEDOWN (N/A) SMALL BOWEL  RESECTION (N/A) d/c foley sips of clears COPD- home inhalers. HTN:  ARB, HCTZ, diltiazem entereg Await return of bowel function.  LOS: 1 day    [facility] 04/03/2012

## 2012-04-03 NOTE — Progress Notes (Signed)
   CARE MANAGEMENT NOTE 04/03/2012  Patient:  [name], [name]   Account Number:  [id number]  Date Initiated:  04/03/2012  Documentation initiated by:  Jiles Crocker  Subjective/Objective Assessment:   ADMITTED FOR SURGERY Ileostomy takedown, small bowel resection     Action/Plan:   PCP IS DR Debby Bud; LIVES AT HOME WITH SPOUSE; POSSIBLY NEEDS HHC AT DISCHARGE; CM WILL CONTINUE TO FOLLOW FOR DCP   Anticipated DC Date:  04/10/2012   Anticipated DC Plan:  HOME W HOME HEALTH SERVICES      DC Planning Services  CM consult          Status of service:  In process, will continue to follow Medicare Important Message given?  NA - LOS <3 / Initial given by admissions (If response is "NO", the following Medicare IM given date fields will be blank)  Per UR Regulation:  Reviewed for med. necessity/level of care/duration of stay  Comments:  04/03/2012- B Dreon Pineda RN, BSN, MHA

## 2012-04-04 LAB — BASIC METABOLIC PANEL
BUN: 15 mg/dL (ref 6–23)
CO2: 25 mEq/L (ref 19–32)
Calcium: 9.2 mg/dL (ref 8.4–10.5)
Chloride: 106 mEq/L (ref 96–112)
Creatinine, Ser: 0.86 mg/dL (ref 0.50–1.10)
GFR calc Af Amer: 80 mL/min — ABNORMAL LOW (ref >90)
GFR calc non Af Amer: 69 mL/min — ABNORMAL LOW (ref >90)
Glucose, Bld: 119 mg/dL — ABNORMAL HIGH (ref 70–99)
Potassium: 4.3 mEq/L (ref 3.5–5.1)
Sodium: 137 mEq/L (ref 135–145)

## 2012-04-04 LAB — CBC
HCT: 32.6 % — ABNORMAL LOW (ref 36.0–46.0)
Hemoglobin: 11.1 g/dL — ABNORMAL LOW (ref 12.0–15.0)
MCH: 36.5 pg — ABNORMAL HIGH (ref 26.0–34.0)
MCHC: 34 g/dL (ref 30.0–36.0)
MCV: 107.2 fL — ABNORMAL HIGH (ref 78.0–100.0)
Platelets: 121 10*3/uL — ABNORMAL LOW (ref 150–400)
RBC: 3.04 MIL/uL — ABNORMAL LOW (ref 3.87–5.11)
RDW: 14.3 % (ref 11.5–15.5)
WBC: 8.9 10*3/uL (ref 4.0–10.5)

## 2012-04-04 MED ORDER — CHLORHEXIDINE GLUCONATE 0.12 % MT SOLN
15.0000 mL | Freq: Two times a day (BID) | OROMUCOSAL | Status: DC
  Administered 2012-04-04 – 2012-04-05 (×2): 15 mL via OROMUCOSAL
  Filled 2012-04-04 (×5): qty 15

## 2012-04-04 MED ORDER — BIOTENE DRY MOUTH MT LIQD
15.0000 mL | Freq: Two times a day (BID) | OROMUCOSAL | Status: DC
  Administered 2012-04-04: 15 mL via OROMUCOSAL

## 2012-04-04 NOTE — Progress Notes (Signed)
Patient ID: [name], female   DOB: Jan 28, 1946, 66 y.o.   MRN: [id number] 2 Days Post-Op  Subjective: Pt doing well.  Transitioned to prn pain meds well.  Has ambulated.  Feeling "rumbling in abdomen."   Objective: Vital signs in last 24 hours: Temp:  [97.4 F (36.3 C)-97.5 F (36.4 C)] 97.5 F (36.4 C) (06/26 0517) Pulse Rate:  [64-73] 64  (06/26 0517) Resp:  [16-18] 16  (06/26 0517) BP: (104-111)/(55-65) 107/63 mmHg (06/26 0517) SpO2:  [97 %-100 %] 97 % (06/26 0517)    Intake/Output from previous day: 06/25 0701 - 06/26 0700 In: 1865.4 [I.V.:1865.4] Out: 2950 [Urine:2950] Intake/Output this shift: Total I/O In: -  Out: 100 [Urine:100]  General appearance: alert, cooperative and no distress GI: soft, non distended.  approp tender dressing moist.    Lab Results:   Basename 04/04/12 0355 04/03/12 0410  WBC 8.9 10.0  HGB 11.1* 11.6*  HCT 32.6* 33.8*  PLT 121* 150   BMET  Basename 04/04/12 0355 04/03/12 0410  NA 137 134*  K 4.3 4.5  CL 106 101  CO2 25 24  GLUCOSE 119* 145*  BUN 15 17  CREATININE 0.86 0.98  CALCIUM 9.2 9.0   PT/INR No results found for this basename: LABPROT:2,INR:2 in the last 72 hours ABG No results found for this basename: PHART:2,PCO2:2,PO2:2,HCO3:2 in the last 72 hours  Studies/Results: No results found.  Anti-infectives: Anti-infectives     Start     Dose/Rate Route Frequency Ordered Stop   04/02/12 1400   cefOXitin (MEFOXIN) 1 g in dextrose 5 % 50 mL IVPB        1 g 100 mL/hr over 30 Minutes Intravenous Every 6 hours 04/02/12 1201 04/03/12 0234   04/02/12 0600   cefOXitin (MEFOXIN) 2 g in dextrose 5 % 50 mL IVPB        2 g 100 mL/hr over 30 Minutes Intravenous 60 min pre-op 04/02/12 0524 04/02/12 0810   04/02/12 0600   metroNIDAZOLE (FLAGYL) IVPB 500 mg        500 mg 100 mL/hr over 60 Minutes Intravenous  Once 04/02/12 0524 04/02/12 0819          Assessment/Plan: s/p Procedure(s) (LRB): ILEOSTOMY TAKEDOWN  (N/A) SMALL BOWEL RESECTION (N/A) Clear liquid diet. Change dressing.   COPD- home inhalers. HTN:  ARB, HCTZ, diltiazem entereg Await return of bowel function.  LOS: 2 days    [facility] 04/04/2012

## 2012-04-05 LAB — BASIC METABOLIC PANEL
BUN: 10 mg/dL (ref 6–23)
CO2: 28 mEq/L (ref 19–32)
Calcium: 9.2 mg/dL (ref 8.4–10.5)
Chloride: 104 mEq/L (ref 96–112)
Creatinine, Ser: 0.85 mg/dL (ref 0.50–1.10)
GFR calc Af Amer: 81 mL/min — ABNORMAL LOW (ref >90)
GFR calc non Af Amer: 70 mL/min — ABNORMAL LOW (ref >90)
Glucose, Bld: 97 mg/dL (ref 70–99)
Potassium: 4 mEq/L (ref 3.5–5.1)
Sodium: 140 mEq/L (ref 135–145)

## 2012-04-05 LAB — CBC
HCT: 33.6 % — ABNORMAL LOW (ref 36.0–46.0)
Hemoglobin: 11.1 g/dL — ABNORMAL LOW (ref 12.0–15.0)
MCH: 35.8 pg — ABNORMAL HIGH (ref 26.0–34.0)
MCHC: 33 g/dL (ref 30.0–36.0)
MCV: 108.4 fL — ABNORMAL HIGH (ref 78.0–100.0)
Platelets: 117 10*3/uL — ABNORMAL LOW (ref 150–400)
RBC: 3.1 MIL/uL — ABNORMAL LOW (ref 3.87–5.11)
RDW: 14.3 % (ref 11.5–15.5)
WBC: 6.3 10*3/uL (ref 4.0–10.5)

## 2012-04-05 MED ORDER — OXYCODONE-ACETAMINOPHEN 5-325 MG PO TABS: 1.0000 | ORAL_TABLET | ORAL | Status: DC | PRN

## 2012-04-05 NOTE — Discharge Instructions (Signed)
[facility]      [facility] °[phone] ° °ABDOMINAL SURGERY: POST OP INSTRUCTIONS ° °Always review your discharge instruction sheet given to you by the facility where your surgery was performed. ° °IF YOU HAVE DISABILITY OR FAMILY LEAVE FORMS, YOU MUST BRING THEM TO THE OFFICE FOR PROCESSING.  PLEASE DO NOT GIVE THEM TO YOUR DOCTOR. ° °1. A prescription for pain medication may be given to you upon discharge.  Take your pain medication as prescribed, if needed.  If narcotic pain medicine is not needed, then you may take acetaminophen (Tylenol) or ibuprofen (Advil) as needed. °2. Take your usually prescribed medications unless otherwise directed. °3. If you need a refill on your pain medication, please contact your pharmacy. They will contact our office to request authorization.  Prescriptions will not be filled after 5pm or on week-ends. °4. You should follow a light diet the first few days after arrival home, such as soup and crackers, pudding, etc.unless your doctor has advised otherwise. A high-fiber, low fat diet can be resumed as tolerated.   Be sure to include lots of fluids daily. Most patients will experience some swelling and bruising on the chest and neck area.  Ice packs will help.  Swelling and bruising can take several days to resolve °5. Most patients will experience some swelling and bruising in the area of the incision. Ice pack will help. Swelling and bruising can take several days to resolve..  °6. It is common to experience some constipation if taking pain medication after surgery.  Increasing fluid intake and taking a stool softener will usually help or prevent this problem from occurring.  A mild laxative (Milk of Magnesia or Miralax) should be taken according to package directions if there are no bowel movements after 48 hours. °7.  You may have steri-strips (small skin tapes) in place directly over the incision.  These strips should be left on the skin for 10-14 days.  If your  surgeon used skin glue on the incision, you may shower in 48 hours.  The glue will flake off over the next 2-3 weeks.  Any sutures or staples will be removed at the office during your follow-up visit. You may find that a light gauze bandage over your incision may keep your staples from being rubbed or pulled. You may shower and replace the bandage daily. °8. ACTIVITIES:  You may resume regular (light) daily activities beginning the next day--such as daily self-care, walking, climbing stairs--gradually increasing activities as tolerated.  You may have sexual intercourse when it is comfortable.  Refrain from any heavy lifting or straining until approved by your doctor. °a. You may drive when you no longer are taking prescription pain medication, you can comfortably wear a seatbelt, and you can safely maneuver your car and apply brakes °b. Return to Work: __________6-8 weeks if applicable_________________________ °9. You should see your doctor in the office for a follow-up appointment approximately two weeks after your surgery.  Make sure that you call for this appointment within a day or two after you arrive home to insure a convenient appointment time. °OTHER INSTRUCTIONS:  °_____________________________________________________________ °_____________________________________________________________ ° °WHEN TO CALL YOUR DOCTOR: °1. Fever over 101.0 °2. Inability to urinate °3. Nausea and/or vomiting °4. Extreme swelling or bruising °5. Continued bleeding from incision. °6. Increased pain, redness, or drainage from the incision. °7. Difficulty swallowing or breathing °8. Muscle cramping or spasms. °9. Numbness or tingling in hands or feet or around lips. ° °The clinic staff is   available to answer your questions during regular business hours.  Please don’t hesitate to call and ask to speak to one of the nurses if you have concerns. ° °For further questions, please visit [email or web address] ° ° ° °

## 2012-04-05 NOTE — Progress Notes (Signed)
Patient ID: [name], female   DOB: August 03, 1946, 66 y.o.   MRN: [id number] 3 Days Post-Op  Subjective: Pt with multiple stools yesterday.  Had good control.  Passing gas.  Had one episode of nausea this AM, but it was self limited.    Objective: Vital signs in last 24 hours: Temp:  [97.7 F (36.5 C)-98.1 F (36.7 C)] 97.7 F (36.5 C) (06/27 0532) Pulse Rate:  [67-77] 68  (06/27 0532) Resp:  [16-18] 16  (06/27 0532) BP: (120-137)/(49-67) 120/67 mmHg (06/27 0532) SpO2:  [95 %-97 %] 97 % (06/27 0904) Last BM Date: 03/28/12  Intake/Output from previous day: 06/26 0701 - 06/27 0700 In: 2026.3 [P.O.:300; I.V.:1726.3] Out: 3051 [Urine:3050; Stool:1] Intake/Output this shift: Total I/O In: -  Out: 400 [Urine:400]  General appearance: alert, cooperative and no distress GI: soft, non distended.  approp tender dressing moist.    Lab Results:   Basename 04/05/12 0345 04/04/12 0355  WBC 6.3 8.9  HGB 11.1* 11.1*  HCT 33.6* 32.6*  PLT 117* 121*   BMET  Basename 04/05/12 0345 04/04/12 0355  NA 140 137  K 4.0 4.3  CL 104 106  CO2 28 25  GLUCOSE 97 119*  BUN 10 15  CREATININE 0.85 0.86  CALCIUM 9.2 9.2   PT/INR No results found for this basename: LABPROT:2,INR:2 in the last 72 hours ABG No results found for this basename: PHART:2,PCO2:2,PO2:2,HCO3:2 in the last 72 hours  Studies/Results: No results found.  Anti-infectives: Anti-infectives     Start     Dose/Rate Route Frequency Ordered Stop   04/02/12 1400   cefOXitin (MEFOXIN) 1 g in dextrose 5 % 50 mL IVPB        1 g 100 mL/hr over 30 Minutes Intravenous Every 6 hours 04/02/12 1201 04/03/12 0234   04/02/12 0600   cefOXitin (MEFOXIN) 2 g in dextrose 5 % 50 mL IVPB        2 g 100 mL/hr over 30 Minutes Intravenous 60 min pre-op 04/02/12 0524 04/02/12 0810   04/02/12 0600   metroNIDAZOLE (FLAGYL) IVPB 500 mg        500 mg 100 mL/hr over 60 Minutes Intravenous  Once 04/02/12 0524 04/02/12 0819           Assessment/Plan: s/p Procedure(s) (LRB): ILEOSTOMY TAKEDOWN (N/A) SMALL BOWEL RESECTION (N/A) Full liquid diet, advance as tolerated.   Change dressing.   COPD- home inhalers. HTN:  ARB, HCTZ, diltiazem entereg   LOS: 3 days    [facility] 04/05/2012

## 2012-04-06 ENCOUNTER — Other Ambulatory Visit (INDEPENDENT_AMBULATORY_CARE_PROVIDER_SITE_OTHER): Payer: Self-pay | Admitting: General Surgery

## 2012-04-06 DIAGNOSIS — Z872 Personal history of diseases of the skin and subcutaneous tissue: Secondary | ICD-10-CM

## 2012-04-06 MED ORDER — SODIUM CHLORIDE 0.9 % IJ SOLN
3.0000 mL | Freq: Two times a day (BID) | INTRAMUSCULAR | Status: DC
  Administered 2012-04-06 – 2012-04-07 (×3): 3 mL via INTRAVENOUS

## 2012-04-06 MED ORDER — SODIUM CHLORIDE 0.9 % IJ SOLN: 3.0000 mL | INTRAMUSCULAR | Status: DC | PRN

## 2012-04-06 MED ORDER — SODIUM CHLORIDE 0.9 % IV SOLN: 250.0000 mL | INTRAVENOUS | Status: DC | PRN

## 2012-04-06 MED ORDER — AMOXICILLIN-POT CLAVULANATE 875-125 MG PO TABS: 1.0000 | ORAL_TABLET | Freq: Two times a day (BID) | ORAL | Status: AC

## 2012-04-06 NOTE — Care Management Note (Unsigned)
    Page 1 of 1   04/06/2012     4:29:55 PM   CARE MANAGEMENT NOTE 04/06/2012  Patient:  [name], [name]   Account Number:  [id number]  Date Initiated:  04/03/2012  Documentation initiated by:  Jiles Crocker  Subjective/Objective Assessment:   ADMITTED FOR SURGERY Ileostomy takedown, small bowel resection     Action/Plan:   PCP IS DR Debby Bud; LIVES AT HOME WITH SPOUSE; POSSIBLY NEEDS HHC AT DISCHARGE; CM WILL CONTINUE TO FOLLOW FOR DCP   Anticipated DC Date:  04/10/2012   Anticipated DC Plan:  HOME W HOME HEALTH SERVICES      DC Planning Services  CM consult      Choice offered to / List presented to:  C-1 Patient           Status of service:  In process, will continue to follow Medicare Important Message given?  NA - LOS <3 / Initial given by admissions (If response is "NO", the following Medicare IM given date fields will be blank) Date Medicare IM given:   Date Additional Medicare IM given:    Discharge Disposition:    Per UR Regulation:  Reviewed for med. necessity/level of care/duration of stay  If discussed at Long Length of Stay Meetings, dates discussed:    Comments:  04/06/12 Wynston Romey RN,BSN NCM [phone] [facility] CHOSEN,SPOKE TO SUSAN DALE & AWARE TO FOLLOW FOR D/C.IF MD AGREE HHRN,WILL NEED HH ORDERS.  04/03/2012- B CHANDLER RN, BSN, MHA

## 2012-04-06 NOTE — Progress Notes (Signed)
Patient ID: [name], female   DOB: July 25, 1946, 67 y.o.   MRN: [id number] 4 Days Post-Op  Subjective: No stools yesterday,but continues to pass flatus.  Tolerated full liquid diet.  No bloating or belching.    Objective: Vital signs in last 24 hours: Temp:  [97.5 F (36.4 C)-98.7 F (37.1 C)] 97.5 F (36.4 C) (06/28 0612) Pulse Rate:  [63-72] 67  (06/28 0612) Resp:  [18] 18  (06/28 0612) BP: (100-151)/(42-68) 111/68 mmHg (06/28 0612) SpO2:  [93 %-98 %] 98 % (06/28 0612) Last BM Date: 04/05/12  Intake/Output from previous day: 06/27 0701 - 06/28 0700 In: 1878.8 [P.O.:540; I.V.:1338.8] Out: 2975 [Urine:2975] Intake/Output this shift:    General appearance: alert, cooperative and no distress GI: soft, non distended.  approp tender dressing moist.   Wound without signs of cellulitis.  Wick changed.    Lab Results:   Basename 04/05/12 0345 04/04/12 0355  WBC 6.3 8.9  HGB 11.1* 11.1*  HCT 33.6* 32.6*  PLT 117* 121*   BMET  Basename 04/05/12 0345 04/04/12 0355  NA 140 137  K 4.0 4.3  CL 104 106  CO2 28 25  GLUCOSE 97 119*  BUN 10 15  CREATININE 0.85 0.86  CALCIUM 9.2 9.2   PT/INR No results found for this basename: LABPROT:2,INR:2 in the last 72 hours ABG No results found for this basename: PHART:2,PCO2:2,PO2:2,HCO3:2 in the last 72 hours  Studies/Results: No results found.  Anti-infectives: Anti-infectives     Start     Dose/Rate Route Frequency Ordered Stop   04/02/12 1400   cefOXitin (MEFOXIN) 1 g in dextrose 5 % 50 mL IVPB        1 g 100 mL/hr over 30 Minutes Intravenous Every 6 hours 04/02/12 1201 04/03/12 0234   04/02/12 0600   cefOXitin (MEFOXIN) 2 g in dextrose 5 % 50 mL IVPB        2 g 100 mL/hr over 30 Minutes Intravenous 60 min pre-op 04/02/12 0524 04/02/12 0810   04/02/12 0600   metroNIDAZOLE (FLAGYL) IVPB 500 mg        500 mg 100 mL/hr over 60 Minutes Intravenous  Once 04/02/12 0524 04/02/12 0819          Assessment/Plan: s/p  Procedure(s) (LRB): ILEOSTOMY TAKEDOWN (N/A) SMALL BOWEL RESECTION (N/A) Change to full diet. Saline lock Po pain meds.   COPD- home inhalers. HTN:  ARB, HCTZ, diltiazem Possibly home tomorrow.    LOS: 4 days    [facility] 04/06/2012

## 2012-04-07 MED ORDER — PANTOPRAZOLE SODIUM 40 MG PO TBEC
40.0000 mg | DELAYED_RELEASE_TABLET | Freq: Every day | ORAL | Status: DC
  Administered 2012-04-07: 40 mg via ORAL
  Filled 2012-04-07 (×2): qty 1

## 2012-04-07 NOTE — Progress Notes (Signed)
Patient ID: [name], female   DOB: 06-17-1946, 66 y.o.   MRN: [id number]  General Surgery - [facility] - Progress Note  POD# 5  Subjective: Patient up in chair.  Ambulatory.  Started regular diet yesterday.  Multiple loose BM's overnight with incontinence.  Objective: Vital signs in last 24 hours: Temp:  [97.8 F (36.6 C)-98.6 F (37 C)] 97.8 F (36.6 C) (06/29 0559) Pulse Rate:  [60-69] 64  (06/29 0559) Resp:  [18] 18  (06/29 0559) BP: (120-138)/(50-72) 120/65 mmHg (06/29 0559) SpO2:  [95 %-98 %] 96 % (06/29 0559) Last BM Date: 04/07/12  Intake/Output from previous day: 06/28 0701 - 06/29 0700 In: 483 [P.O.:480; I.V.:3] Out: 2500 [Urine:2500]  Exam: HEENT - clear, not icteric Neck - soft Chest - clear bilaterally Cor - RRR, no murmur Abd - soft, wound right abd wall with small brownish drainage; active BS Ext - no significant edema Neuro - grossly intact, no focal deficits  Lab Results:   Basename 04/05/12 0345  WBC 6.3  HGB 11.1*  HCT 33.6*  PLT 117*     Basename 04/05/12 0345  NA 140  K 4.0  CL 104  CO2 28  GLUCOSE 97  BUN 10  CREATININE 0.85  CALCIUM 9.2    Studies/Results: No results found.  Assessment / Plan: 1.  Status post ileostomy closure  - regular diet  - begin BID dressing changes to ostomy site wound  - home tomorrow per patient wishes  Velora Heckler, MD, [facility] Office: [phone]  04/07/2012

## 2012-04-08 MED ORDER — OXYCODONE-ACETAMINOPHEN 5-325 MG PO TABS: 1.0000 | ORAL_TABLET | ORAL | Status: AC | PRN

## 2012-04-08 NOTE — Discharge Summary (Signed)
Physician Discharge Summary [facility]  Patient ID: [name] MRN: [id number] DOB/AGE: July 27, 1946 66 y.o.  Admit date: 04/02/2012 Discharge date: 04/08/2012  Admission Diagnoses:  Hx of rectal cancer, for ileostomy closure  Discharge Diagnoses:  Active Problems:  * No active hospital problems. *    Discharged Condition: good  Hospital Course: patient admitted after ileostomy closure in OR.  Post op course uncomplicated.  Ileus resolved.  Initial loose frequent BM's improved with better continence.  Tolerated regular diet.  Prepared for discharge on POD#6.  Consults: None  Significant Diagnostic Studies: none  Treatments: surgery: ileostomy closure  Discharge Exam: Blood pressure 103/65, pulse 70, temperature 97.6 F (36.4 C), temperature source Oral, resp. rate 16, height 5\' 8"  (1.727 m), weight 219 lb 3.2 oz (99.428 kg), SpO2 91.00%. HEENT - clear Chest - clear bilat BS Cor - RRR Abd - soft, obese; active BS; loose BM's  Disposition: Home with family  Discharge Orders    Future Orders Please Complete By Expires   Diet - low sodium heart healthy      Increase activity slowly      Discharge instructions      Comments:   [facility] Office: [phone]  OPEN ABDOMINAL SURGERY: POST OP INSTRUCTIONS  Always review your discharge instruction sheet given to you by the facility where your surgery was performed.  A prescription for pain medication may be given to you upon discharge.  Take your pain medication as prescribed, if needed.  If narcotic pain medicine is not needed, then you may take acetaminophen (Tylenol) or ibuprofen (Advil) as needed. Take your usually prescribed medications unless otherwise directed. If you need a refill on your pain medication, please contact your pharmacy. They will contact our office to request authorization.  Prescriptions will not be filled after 5pm or on weekends. You should follow a light  diet the first few days after arrival home, such as soup and crackers, unless your doctor has advised otherwise. A high-fiber, low fat diet can be resumed as tolerated.  Be sure to include lots of fluids daily.  Most patients will experience some swelling and bruising in the area of the incision. Ice packs will help. Swelling and bruising can take several days to resolve. It is common to experience some constipation if taking pain medication after surgery.  Increasing fluid intake and taking a stool softener will usually help or prevent this problem from occurring.  A mild laxative (Milk of Magnesia or Miralax) should be taken according to package directions if there are no bowel movements after 48 hours.  You may have steri-strips (small skin tapes) in place directly over the incision.  These strips should be left on the skin for 7-10 days.  If your surgeon used skin glue on the incision, you may shower in 24 hours.  The glue will flake off over the next 2-3 weeks.  Any sutures or staples will be removed at the office during your follow-up visit. You may find that a light gauze bandage over your incision may keep your staples from being rubbed or pulled. You may shower and replace the bandage daily. ACTIVITIES:  You may resume regular (light) daily activities beginning the next day--such as daily self-care, walking, climbing stairs--gradually increasing activities as tolerated.  You may have sexual intercourse when it is comfortable.  Refrain from any heavy lifting or straining until approved by your doctor.  You may drive when you no longer are taking prescription  pain medication, you can comfortably wear a seatbelt, and you can safely maneuver your car and apply brakes. You should see your doctor in the office for a follow-up appointment approximately two weeks after your surgery.  Make sure that you call for this appointment within a day or two after you arrive home to insure a convenient appointment  time.  WHEN TO CALL YOUR DOCTOR: Fever over 101.0 Inability to urinate Nausea and/or vomiting Extreme swelling or bruising Continued bleeding from incision. Increased pain, redness, or drainage from the incision. Difficulty swallowing or breathing Muscle cramping or spasms. Numbness or tingling in hands or feet or around lips.  IF YOU HAVE DISABILITY OR FAMILY LEAVE FORMS, YOU MUST BRING THEM TO THE OFFICE FOR PROCESSING.  PLEASE DO NOT GIVE THEM TO YOUR DOCTOR.  The clinic staff is available to answer your questions during regular business hours.  Please don't hesitate to call and ask to speak to one of the nurses if you have concerns.  For further questions, please visit [email or web address]   Change dressing (specify)      Comments:   Dressing change: 2 times per day using NS moistened gauze wet to dry.  Shower wound once daily.     Medication List  As of 04/08/2012  8:38 AM   TAKE these medications         albuterol 108 (90 BASE) MCG/ACT inhaler   Commonly known as: PROVENTIL HFA;VENTOLIN HFA   Inhale 2 puffs into the lungs every 6 (six) hours as needed for wheezing.      amoxicillin-clavulanate 875-125 MG per tablet   Commonly known as: AUGMENTIN   Take 1 tablet by mouth 2 (two) times daily.      aspirin 81 MG tablet   Take 81 mg by mouth at bedtime.      CALCIUM 600 + D PO   Take 1,200 mg by mouth at bedtime.      ciprofloxacin 750 MG tablet   Commonly known as: CIPRO   BID for 3 days      diltiazem 120 MG 24 hr capsule   Commonly known as: CARDIZEM CD   Take 120 mg by mouth at bedtime.      diphenoxylate-atropine 2.5-0.025 MG per tablet   Commonly known as: LOMOTIL   1 tablet as needed.      Fluticasone-Salmeterol 100-50 MCG/DOSE Aepb   Commonly known as: ADVAIR   Inhale 1 puff into the lungs 2 (two) times daily.      losartan 50 MG tablet   Commonly known as: COZAAR   50 mg at bedtime.      lovastatin 40 MG tablet   Commonly known as:  MEVACOR   take 1 tablet by mouth once daily      montelukast 10 MG tablet   Commonly known as: SINGULAIR   Take 1 tablet (10 mg total) by mouth at bedtime.      nystatin powder   Commonly known as: MYCOSTATIN   Apply 1 Units topically daily. Applies to skin folds or around stoma as needed for fungal infection.      oxyCODONE-acetaminophen 5-325 MG per tablet   Commonly known as: PERCOCET   Take 1-2 tablets by mouth every 4 (four) hours as needed for pain.      pantoprazole 40 MG tablet   Commonly known as: PROTONIX   Take 40 mg by mouth at bedtime.      tiotropium 18 MCG inhalation capsule   Commonly known as:  SPIRIVA   Place 1 capsule (18 mcg total) into inhaler and inhale daily.           Follow-up Information    Follow up with [facility], [facility]. Schedule an appointment as soon as possible for a visit in 2 weeks.   Contact information:   [employer] [address] [place] [address] [phone]         Velora Heckler, MD, [facility] Office: [phone]    Signed: Velora Heckler 04/08/2012, 8:38 AM

## 2012-04-08 NOTE — Progress Notes (Signed)
Cm spoke with patient to confirm discharge plan. Pt to discharge home with family. Family to assist with BID dressing changes, per pt family comfortable with education provided by RN. Per pt no HH services needed. No DME requested. Pt to follow up with Dr.Byerly in 2 weeks. [facility] notified of no further request for [facility] services.   Leonie Green [phone]

## 2012-04-10 ENCOUNTER — Telehealth: Payer: Self-pay

## 2012-04-10 NOTE — Telephone Encounter (Signed)
RN called to inform MD that a level 1 interaction was discovered between diltiazem and lovastatin - diltiazem may inhibit the metabolism of the lovastatin.

## 2012-04-11 NOTE — Telephone Encounter (Signed)
This is a minor interaction. No change in either prescription

## 2012-04-13 NOTE — Telephone Encounter (Signed)
Returned call to Angie of reponse concerning drug interaction

## 2012-04-23 ENCOUNTER — Ambulatory Visit (INDEPENDENT_AMBULATORY_CARE_PROVIDER_SITE_OTHER): Payer: Medicare Other | Admitting: General Surgery

## 2012-04-23 ENCOUNTER — Encounter (INDEPENDENT_AMBULATORY_CARE_PROVIDER_SITE_OTHER): Payer: Self-pay | Admitting: General Surgery

## 2012-04-23 VITALS — BP 128/70 | HR 72 | Temp 97.4°F | Resp 16 | Ht 68.0 in | Wt 221.0 lb

## 2012-04-23 DIAGNOSIS — C20 Malignant neoplasm of rectum: Secondary | ICD-10-CM

## 2012-04-23 NOTE — Progress Notes (Signed)
HISTORY: Patient is now 3 weeks status post ileostomy takedown. She did remarkably well postoperatively and was able to go home in about 3 or 4 days. She has not had to go back on her steroids.  She has not had any issues with redness or drainage from her wound. She has not had any fevers or chills. She has had some bowel irregularity. She alternates with feeling constipated one day and having loose stools the next day. She sometimes feels like she can't fully evacuate the stool. She has been taking MiraLAX and this has seemed to improve matters. She denies any incontinence of stool or gas.    EXAM: General:  Alert and oriented times 3 Incision:  No erythema or skin thickening.  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  She can continue taking MiraLAX as needed  I will see her back in November. She has already gotten a colonoscopy postoperatively prior to her takedown.  She was told she would need another one in one year.       Maudry Diego, MD Surgical Oncology, General & Endocrine Surgery [facility]  Illene Regulus, MD Debby Bud Rosalyn Gess, MD

## 2012-04-23 NOTE — Assessment & Plan Note (Signed)
Patient doing well status post ileostomy takedown.  She can continue taking MiraLAX as needed  I will see her back in November. She has already gotten a colonoscopy postoperatively prior to her takedown.  She was told she would need another one in one year.

## 2012-04-23 NOTE — Patient Instructions (Signed)
Dry dressing as needed.  Follow up with me in November.

## 2012-04-24 ENCOUNTER — Telehealth: Payer: Self-pay | Admitting: *Deleted

## 2012-04-24 ENCOUNTER — Other Ambulatory Visit: Payer: Self-pay | Admitting: *Deleted

## 2012-04-24 DIAGNOSIS — C20 Malignant neoplasm of rectum: Secondary | ICD-10-CM

## 2012-04-24 NOTE — Telephone Encounter (Signed)
Called pt at home and left message on voice mail re:  Informed pt that a scheduler will contact pt with date and time for appts in October  With Dr. Dalene Carrow.   Onc. Tx schedule sent to scheduler.

## 2012-04-24 NOTE — Telephone Encounter (Signed)
Pt called to inform Dr. Dalene Carrow re:   Pt had surgery by Dr. Donell Beers on  04/02/12.   Pt saw Dr. Donell Beers for post op f/u  Yesterday 04/23/12 and had staples removed.  Pt does not have f/u appt with surgeon yet .     Pt was told by surgeon that once pt has f/u with Dr. Dalene Carrow,  Pt will be seen by surgeon alternate with oncologist. Pt's   Phone     [phone]    ;    Cell     [phone].

## 2012-04-25 ENCOUNTER — Telehealth: Payer: Self-pay | Admitting: Hematology and Oncology

## 2012-04-25 NOTE — Telephone Encounter (Signed)
S/w pt re appts for lb/ct 10/7 and LO 10/9. Pt will arrive @ [facility] rad 2hrs early for water base water.

## 2012-05-01 ENCOUNTER — Other Ambulatory Visit: Payer: Self-pay | Admitting: Internal Medicine

## 2012-05-22 ENCOUNTER — Telehealth: Payer: Self-pay | Admitting: *Deleted

## 2012-05-22 NOTE — Telephone Encounter (Signed)
Pt called wanting to know re:  Pt has recently going back to volunteering at [employer].   Employee health requires pt to have tetanus shot .   Pt wanted to know if it is ok with Dr. Dalene Carrow for pt to receive tetanus vaccine, and if ok, pt will need a note indicating of same info. Pt's   Phone     [phone]    ;    Cell     [phone].

## 2012-05-22 NOTE — Telephone Encounter (Signed)
Called pt at home and informed pt re: a signed note from md stating that pt can receive Tetanus vaccine will be left with injection nurse for pt to pick up.  Pt voiced understanding.

## 2012-07-05 ENCOUNTER — Encounter: Payer: Self-pay | Admitting: Internal Medicine

## 2012-07-05 ENCOUNTER — Ambulatory Visit (INDEPENDENT_AMBULATORY_CARE_PROVIDER_SITE_OTHER): Payer: Medicare Other | Admitting: Internal Medicine

## 2012-07-05 VITALS — BP 136/70 | HR 68 | Temp 97.5°F | Resp 15 | Wt 231.8 lb

## 2012-07-05 DIAGNOSIS — M5380 Other specified dorsopathies, site unspecified: Secondary | ICD-10-CM

## 2012-07-05 DIAGNOSIS — C20 Malignant neoplasm of rectum: Secondary | ICD-10-CM

## 2012-07-05 DIAGNOSIS — I10 Essential (primary) hypertension: Secondary | ICD-10-CM

## 2012-07-05 DIAGNOSIS — M549 Dorsalgia, unspecified: Secondary | ICD-10-CM

## 2012-07-05 DIAGNOSIS — M545 Low back pain, unspecified: Secondary | ICD-10-CM

## 2012-07-05 DIAGNOSIS — M62830 Muscle spasm of back: Secondary | ICD-10-CM

## 2012-07-05 MED ORDER — CYCLOBENZAPRINE HCL 5 MG PO TABS: 5.0000 mg | ORAL_TABLET | Freq: Three times a day (TID) | ORAL | Status: DC | PRN

## 2012-07-05 NOTE — Progress Notes (Signed)
Subjective:    Patient ID: [name], female    DOB: 1946-10-02, 66 y.o.   MRN: [id number]  Back Pain This is a new problem. The current episode started in the past 7 days. The problem occurs intermittently. The problem has been gradually worsening since onset. The pain is present in the lumbar spine (right side). The quality of the pain is described as cramping. The pain does not radiate. The pain is moderate. The pain is the same all the time. The symptoms are aggravated by position, sitting, twisting, standing and lying down. Stiffness is present all day. Pertinent negatives include no abdominal pain, headaches, leg pain, numbness, pelvic pain, perianal numbness or tingling. Risk factors include history of cancer. She has tried NSAIDs for the symptoms. The treatment provided mild relief.  describes pain as 7/10 at worst  Past Medical History  Diagnosis Date  . IBS (irritable bowel syndrome)   . Hyperlipidemia   . Allergic rhinitis   . History of pneumonia   . COPD (chronic obstructive pulmonary disease)   . Asthma   . Heart murmur     Aortic  . Lung nodule     RLL nodule-41mm stable 2006, April 2009, and June 2009  . Candidiasis of breast   . Anemia   . Hyperlipemia   . Obesity   . Unspecified essential hypertension   . Heart valve problem     PER CARDIOLOGIST 05-03-2011  . Sleep difficulties   . Hemorrhoids   . Cancer 04/2011    rectal  . Sleep apnea     PT TOLD BORDERLINE-TRIED CPAP-DID NOT HELP-SHE DOES NOT USE CPAP  . Arthritis   . 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 History of radiation therapy 05/30/11 to 07/07/11    rectum  . Prophylactic chemotherapy     Will finish chemo on 03/04/12  . Pneumonia     hx of several times      Review of Systems  Gastrointestinal: Negative for abdominal pain.  Genitourinary: Negative for pelvic pain.  Musculoskeletal: Positive for back pain. Negative for arthralgias and gait problem.  Neurological: Negative for tingling, numbness  and headaches.       Objective:   Physical Exam BP 136/70  Pulse 68  Temp 97.5 F (36.4 C) (Oral)  Resp 15  Wt 231 lb 12 oz (105.121 kg)  SpO2 96% Wt Readings from Last 3 Encounters:  07/05/12 231 lb 12 oz (105.121 kg)  04/23/12 221 lb (100.245 kg)  04/02/12 219 lb 3.2 oz (99.428 kg)   Constitutional: She appears well-developed and well-nourished. No distress.  Neck: Normal range of motion. Neck supple. No JVD present. No thyromegaly present.  Cardiovascular: Normal rate, regular rhythm and normal heart sounds.  No murmur heard. No BLE edema. Pulmonary/Chest: Effort normal and breath sounds normal. No respiratory distress. She has no wheezes.  Musculoskeletal: Back: full range of motion of thoracic and lumbar spine. Non tender to palpation. Spasm R paravertebral muscles lumbar spine. Negative straight leg raise. DTR's are symmetrically intact. Sensation intact in all dermatomes of the lower extremities. Full strength to manual muscle testing. patient is able to heel toe walk without difficulty and ambulates with antalgic gait. Skin: Skin is warm and dry. No rash noted. No erythema.  Psychiatric: She has a normal mood and affect. Her behavior is normal. Judgment and thought content normal.   Lab Results  Component Value Date   WBC 6.3 04/05/2012   HGB 11.1* 04/05/2012   HCT 33.6* 04/05/2012  PLT 117* 04/05/2012   GLUCOSE 97 04/05/2012   ALT 9 02/17/2012   AST 26 03/15/2012   NA 140 04/05/2012   K 4.0 04/05/2012   CL 104 04/05/2012   CREATININE 0.85 04/05/2012   BUN 10 04/05/2012   CO2 28 04/05/2012   INR 1.17 08/18/2011       Assessment & Plan:   R flank pain - consistent with muscle spasm on exam No neuro deficits, no uro symptoms  Reviewed hx prior lumbar surgery 2007 and rectal cancer tx conservatively with muscle relaxer and ibuprofen - erx done Pt to call if worse or unimproved for additional imaging as needed  Also see problem list. Medications and labs reviewed today.

## 2012-07-05 NOTE — Assessment & Plan Note (Signed)
Chronic symptoms - hx lumbar decompression 2007 Acute exac last 48h as above- tx for spasm and consider imaging if unimproved Pt understands and agrees continue ibuprofen

## 2012-07-05 NOTE — Assessment & Plan Note (Signed)
BP Readings from Last 3 Encounters:  07/05/12 136/70  04/23/12 128/70  04/08/12 103/65   The current medical regimen is effective;  continue present plan and medications.

## 2012-07-05 NOTE — Patient Instructions (Signed)
It was good to see you today. Flexeril for muscle spasm pan - Your prescription(s) have been submitted to your pharmacy. Please take as directed and contact our office if you believe you are having problem(s) with the medication(s). Continue ibuprofen as we discussed Call if worse or unimproved for additional testing as needed Back Exercises Back exercises help treat and prevent back injuries. The goal of back exercises is to increase the strength of your abdominal and back muscles and the flexibility of your back. These exercises should be started when you no longer have back pain. Back exercises include:  Pelvic Tilt. Lie on your back with your knees bent. Tilt your pelvis until the lower part of your back is against the floor. Hold this position 5 to 10 sec and repeat 5 to 10 times.   Knee to Chest. Pull first 1 knee up against your chest and hold for 20 to 30 seconds, repeat this with the other knee, and then both knees. This may be done with the other leg straight or bent, whichever feels better.   Sit-Ups or Curl-Ups. Bend your knees 90 degrees. Start with tilting your pelvis, and do a partial, slow sit-up, lifting your trunk only 30 to 45 degrees off the floor. Take at least 2 to 3 seconds for each sit-up. Do not do sit-ups with your knees out straight. If partial sit-ups are difficult, simply do the above but with only tightening your abdominal muscles and holding it as directed.   Hip-Lift. Lie on your back with your knees flexed 90 degrees. Push down with your feet and shoulders as you raise your hips a couple inches off the floor; hold for 10 seconds, repeat 5 to 10 times.   Back arches. Lie on your stomach, propping yourself up on bent elbows. Slowly press on your hands, causing an arch in your low back. Repeat 3 to 5 times. Any initial stiffness and discomfort should lessen with repetition over time.   Shoulder-Lifts. Lie face down with arms beside your body. Keep hips and torso pressed  to floor as you slowly lift your head and shoulders off the floor.  Do not overdo your exercises, especially in the beginning. Exercises may cause you some mild back discomfort which lasts for a few minutes; however, if the pain is more severe, or lasts for more than 15 minutes, do not continue exercises until you see your caregiver. Improvement with exercise therapy for back problems is slow.   See your caregivers for assistance with developing a proper back exercise program. Document Released: 11/03/2004 Document Revised: 09/15/2011 Document Reviewed: 09/26/2005 [facility] Patient Information 2012 [place], [place].

## 2012-07-16 ENCOUNTER — Encounter: Payer: Self-pay | Admitting: Internal Medicine

## 2012-07-16 ENCOUNTER — Other Ambulatory Visit (HOSPITAL_BASED_OUTPATIENT_CLINIC_OR_DEPARTMENT_OTHER): Payer: Medicare Other | Admitting: Lab

## 2012-07-16 ENCOUNTER — Ambulatory Visit (INDEPENDENT_AMBULATORY_CARE_PROVIDER_SITE_OTHER): Payer: Medicare Other | Admitting: Internal Medicine

## 2012-07-16 ENCOUNTER — Ambulatory Visit (HOSPITAL_COMMUNITY)
Admission: RE | Admit: 2012-07-16 | Discharge: 2012-07-16 | Disposition: A | Discharge status: Home / Self Care | Payer: Medicare Other | Source: Ambulatory Visit | Attending: Hematology and Oncology | Admitting: Hematology and Oncology

## 2012-07-16 VITALS — BP 122/78 | HR 76 | Temp 97.7°F | Resp 16 | Wt 228.0 lb

## 2012-07-16 DIAGNOSIS — R911 Solitary pulmonary nodule: Secondary | ICD-10-CM | POA: Insufficient documentation

## 2012-07-16 DIAGNOSIS — C20 Malignant neoplasm of rectum: Secondary | ICD-10-CM

## 2012-07-16 DIAGNOSIS — B029 Zoster without complications: Secondary | ICD-10-CM

## 2012-07-16 LAB — CBC WITH DIFFERENTIAL/PLATELET
BASO%: 1.1 % (ref 0.0–2.0)
Basophils Absolute: 0 10*3/uL (ref 0.0–0.1)
EOS%: 5 % (ref 0.0–7.0)
Eosinophils Absolute: 0.2 10*3/uL (ref 0.0–0.5)
HCT: 37.6 % (ref 34.8–46.6)
HGB: 13.1 g/dL (ref 11.6–15.9)
LYMPH%: 10.9 % — ABNORMAL LOW (ref 14.0–49.7)
MCH: 32.4 pg (ref 25.1–34.0)
MCHC: 34.7 g/dL (ref 31.5–36.0)
MCV: 93.5 fL (ref 79.5–101.0)
MONO#: 0.5 10*3/uL (ref 0.1–0.9)
MONO%: 10.8 % (ref 0.0–14.0)
NEUT#: 3.1 10*3/uL (ref 1.5–6.5)
NEUT%: 72.2 % (ref 38.4–76.8)
Platelets: 158 10*3/uL (ref 145–400)
RBC: 4.03 10*6/uL (ref 3.70–5.45)
RDW: 13.3 % (ref 11.2–14.5)
WBC: 4.3 10*3/uL (ref 3.9–10.3)
lymph#: 0.5 10*3/uL — ABNORMAL LOW (ref 0.9–3.3)

## 2012-07-16 LAB — COMPREHENSIVE METABOLIC PANEL (CC13)
ALT: 11 U/L (ref 0–55)
AST: 18 U/L (ref 5–34)
Albumin: 3.6 g/dL (ref 3.5–5.0)
Alkaline Phosphatase: 68 U/L (ref 40–150)
BUN: 20 mg/dL (ref 7.0–26.0)
CO2: 25 mEq/L (ref 22–29)
Calcium: 9.4 mg/dL (ref 8.4–10.4)
Chloride: 102 mEq/L (ref 98–107)
Creatinine: 1 mg/dL (ref 0.6–1.1)
Glucose: 94 mg/dl (ref 70–99)
Potassium: 3.6 mEq/L (ref 3.5–5.1)
Sodium: 138 mEq/L (ref 136–145)
Total Bilirubin: 0.8 mg/dL (ref 0.20–1.20)
Total Protein: 7 g/dL (ref 6.4–8.3)

## 2012-07-16 LAB — CEA: CEA: <0.5 ng/mL (ref 0.0–5.0)

## 2012-07-16 MED ORDER — IOHEXOL 300 MG/ML  SOLN
100.0000 mL | Freq: Once | INTRAMUSCULAR | Status: AC | PRN
  Administered 2012-07-16: 100 mL via INTRAVENOUS

## 2012-07-16 MED ORDER — METHYLPREDNISOLONE 8 MG PO TABS: 8.0000 mg | ORAL_TABLET | Freq: Every day | ORAL | Status: DC

## 2012-07-16 MED ORDER — VALACYCLOVIR HCL 1 G PO TABS: 1000.0000 mg | ORAL_TABLET | Freq: Three times a day (TID) | ORAL | Status: DC

## 2012-07-16 NOTE — Patient Instructions (Addendum)
Looks like shingles. Plan - valtrex three times a day for 7 days. Medrol 16 mg daily x 3 days, then 8 mg daily x 7 days. For open lesion you can get Dombro soak - make a poltice and apply for comfort. This will help dry the lesions.  Avoid use of a heating pad.    Shingles Shingles is caused by the same virus that causes chickenpox (varicella zoster virus or VZV). Shingles often occurs many years or decades after having chickenpox. That is why it is more common in adults older than 50 years. The virus reactivates and breaks out as an infection in a nerve root. SYMPTOMS    The initial feeling (sensations) may be pain. This pain is usually described as:   Burning.   Stabbing.   Throbbing.   Tingling in the nerve root.   A red rash will follow in a couple days. The rash may occur in any area of the body and is usually on one side (unilateral) of the body in a band or belt-like pattern. The rash usually starts out as very small blisters (vesicles). They will dry up after 7 to 10 days. This is not usually a significant problem except for the pain it causes.   Long-lasting (chronic) pain is more likely in an elderly person. It can last months to years. This condition is called postherpetic neuralgia.  Shingles can be an extremely severe infection in someone with AIDS, a weakened immune system, or with forms of leukemia. It can also be severe if you are taking transplant medicines or other medicines that weaken the immune system. TREATMENT   Your caregiver will often treat you with:  Antiviral drugs.   Anti-inflammatory drugs.   Pain medicines.  Bed rest is very important in preventing the pain associated with herpes zoster (postherpetic neuralgia). Application of heat in the form of a hot water bottle or electric heating pad or gentle pressure with the hand is recommended to help with the pain or discomfort. PREVENTION   A varicella zoster vaccine is available to help protect against the  virus. The Food and Drug Administration approved the varicella zoster vaccine for individuals 84 years of age and older. HOME CARE INSTRUCTIONS    Cool compresses to the area of rash may be helpful.   Only take over-the-counter or prescription medicines for pain, discomfort, or fever as directed by your caregiver.   Avoid contact with:   Babies.   Pregnant women.   Children with eczema.   Elderly people with transplants.   People with chronic illnesses, such as leukemia and AIDS.   If the area involved is on your face, you may receive a referral for follow-up to a specialist. It is very important to keep all follow-up appointments. This will help avoid eye complications, chronic pain, or disability.  SEEK IMMEDIATE MEDICAL CARE IF:    You develop any pain (headache) in the area of the face or eye. This must be followed carefully by your caregiver or ophthalmologist. An infection in part of your eye (cornea) can be very serious. It could lead to blindness.   You do not have pain relief from prescribed medicines.   Your redness or swelling spreads.   The area involved becomes very swollen and painful.   You have a fever.   You notice any red or painful lines extending away from the affected area toward your heart (lymphangitis).   Your condition is worsening or has changed.  Document Released: 09/26/2005  Document Revised: 12/19/2011 Document Reviewed: 08/31/2009 [facility] Patient Information 2013 [place], [place].

## 2012-07-16 NOTE — Progress Notes (Signed)
  Subjective:    Patient ID: [name], female    DOB: 1946-03-06, 66 y.o.   MRN: [id number]  HPI Patient was seen last Thursday September 26th for back pain, diagnosed as muscle strain.  On Friday the 4th of October she developed an erythematous painful rash right side. The pain has continued and the rash has developed vesicles. She looked it up and it appears like shingles.  PMH, FamHx and SocHx reviewed for any changes and relevance.  Med - reviewed list   Review of Systems System review is negative for any constitutional, cardiac, pulmonary, GI or neuro symptoms or complaints other than as described in the HPI.     Objective:   Physical Exam Filed Vitals:   07/16/12 1625  BP: 122/78  Pulse: 76  Temp: 97.7 F (36.5 C)  Resp: 16   Wt Readings from Last 3 Encounters:  07/16/12 228 lb (103.42 kg)  07/05/12 231 lb 12 oz (105.121 kg)  04/23/12 221 lb (100.245 kg)   Gen'l- WNWD overwieght white female in no distress Back exam - nl stand, flex to 160 degrees, gait, normal toe and heel walk. Derm - dermatomal distribution of eryrthematous rash with vesicles right back T12-L1       Assessment & Plan:  Painful rash right back -Looks like shingles.  Plan - valtrex three times a day for 7 days. Medrol 16 mg daily x 3 days, then 8 mg daily x 7 days. For open lesion you can get Dombro soak - make a poltice and apply for comfort. This will help dry the lesions.  Avoid use of a heating pad.

## 2012-07-18 ENCOUNTER — Telehealth: Payer: Self-pay | Admitting: Hematology and Oncology

## 2012-07-18 ENCOUNTER — Encounter: Payer: Self-pay | Admitting: Hematology and Oncology

## 2012-07-18 ENCOUNTER — Ambulatory Visit (HOSPITAL_BASED_OUTPATIENT_CLINIC_OR_DEPARTMENT_OTHER): Payer: Medicare Other | Admitting: Hematology and Oncology

## 2012-07-18 VITALS — BP 149/75 | HR 98 | Temp 96.5°F | Resp 20 | Ht 68.0 in | Wt 226.1 lb

## 2012-07-18 DIAGNOSIS — C20 Malignant neoplasm of rectum: Secondary | ICD-10-CM

## 2012-07-18 MED ORDER — INFLUENZA VIRUS VACC SPLIT PF IM SUSP
0.5000 mL | Freq: Once | INTRAMUSCULAR | Status: AC
  Administered 2012-07-18: 0.5 mL via INTRAMUSCULAR
  Filled 2012-07-18: qty 0.5

## 2012-07-18 NOTE — Progress Notes (Signed)
CC:   Rosalyn Gess. Norins, MD [name], MD [name], [name], [name] [name], MD  IDENTIFYING STATEMENT:  Patient is a 66 year old woman with rectal cancer who presents for followup.  INTERVAL HISTORY:  Ms. Padovano was seen 4 months ago.  Since that time, has had no issues or concerns.  Notes stable weight.  Denies rectal bleeding or discharge.  Denies anorexia.  Denies abdominal pain.  Reviewed recent results of the CT scan of the chest, abdomen, and pelvis obtained on 07/16/2012.  The chest continued to show stable pulmonary nodules when compared to previous scans dating back and to June 2013. There is no any evidence of intrathoracic metastases.  The abdomen also showed no CT evidence of intra-abdominal or pelvic metastatic disease or recurrence.  Post-radiation changes were noted in the presacral and distal, and rectosigmoid area.  MEDICATIONS:  Reviewed and updated.  ALLERGIES:  Codeine, prednisone, sulfa.  PAST MEDICAL HISTORY/FAMILY HISTORY/SOCIAL HISTORY:  Unchanged.  REVIEW OF SYSTEMS:  Ten-point review of systems negative.  PHYSICAL EXAMINATION:  General:  The patient is a well-appearing, well- nourished woman in no distress.  Vitals:  Pulse 98, blood pressure 149/75, temperature 96.5, respirations 20, weight 226 pounds.  HEENT: Head is atraumatic, normocephalic.  Sclerae anicteric.  Mouth is moist. Neck:  Supple.  Chest:  Clear.  CVS:  Unremarkable.  Abdomen:  Soft, nontender.  No palpable masses.  No hepatomegaly.  Extremities:  No calf tenderness.  Pulses present and symmetrical.  LABORATORY DATA:  07/16/2012 white cell count 4.3, hemoglobin 13.1, hematocrit 37.6, platelets 158.  Sodium 138, potassium 3.6, chloride 102, CO2 25, BUN 20, creatinine 1.  T bilirubin 0.8, alkaline phosphatase 68, AST 18, ALT 11.  CEA less than 0.5.  Results of CTs are as in interval history.  IMPRESSION AND PLAN:  Ms. 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  She is status post concurrent Xeloda with radiation therapy from 05/30/2011 through 07/07/2011.  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  Continued adjuvant Xeloda therapy from 01/30/2012 through 03/05/2012.  Followup CTs currently indicate no evidence of recurrence.  Lab work is unremarkable.  So with this said, she will continue surveillance followup visit in 4 months' time with labs only.  She did receive a colonoscopy in June 2013 prior to her colostomy reversal.    ______________________________ Laurice Record, [name] LIO/MEDQ  D:  07/18/2012  T:  07/18/2012  Job:  [id number]

## 2012-07-18 NOTE — Telephone Encounter (Signed)
Printed and gv pt appt schedule for Feb 2014..the patient is aware of cxr that needs to be done same day as labs in Feb 2014.

## 2012-07-18 NOTE — Patient Instructions (Addendum)
[name]  [id number]   [facility] - AFTER VISIT SUMMARY   **RECOMMENDATIONS MADE BY THE CONSULTANT AND ANY TEST    RESULTS WILL BE SENT TO YOUR REFERRING DOCTORS.   YOUR EXAM FINDINGS, LABS AND RESULTS WERE DISCUSSED BY YOUR MD TODAY.  YOU CAN GO TO THE [facility] WEB SITE FOR INSTRUCTIONS ON HOW TO ASSESS MY CHART FOR ADDITIONAL INFORMATION AS NEEDED.  Your Updated drug allergies are: Allergies as of 07/18/2012 - Review Complete 07/18/2012  Allergen Reaction Noted  . Codeine Nausea Only   . Prednisone Hives   . Sulfonamide derivatives Nausea Only     Your current list of medications are: Current Outpatient Prescriptions  Medication Sig Dispense Refill  . albuterol (VENTOLIN HFA) 108 (90 BASE) MCG/ACT inhaler Inhale 2 puffs into the lungs every 6 (six) hours as needed for wheezing.  1 Inhaler  1  . aspirin 81 MG tablet Take 81 mg by mouth at bedtime.       . Calcium Carbonate-Vitamin D (CALCIUM 600 + D PO) Take 1,200 mg by mouth at bedtime.       Marland Kitchen diltiazem (CARDIZEM CD) 120 MG 24 hr capsule Take 120 mg by mouth at bedtime.       . diphenoxylate-atropine (LOMOTIL) 2.5-0.025 MG per tablet 1 tablet as needed.       . Fluticasone-Salmeterol (ADVAIR DISKUS) 100-50 MCG/DOSE AEPB Inhale 1 puff into the lungs 2 (two) times daily.  60 each  9  . HYDROcodone-acetaminophen (NORCO) 10-325 MG per tablet Take 0.5 tablets by mouth every 6 (six) hours as needed.      Marland Kitchen losartan (COZAAR) 50 MG tablet 50 mg at bedtime.       . lovastatin (MEVACOR) 40 MG tablet take 1 tablet by mouth once daily  30 tablet  3  . methylPREDNISolone (MEDROL) 8 MG tablet Take 1 tablet (8 mg total) by mouth daily. Take two tabs per day x 3 day, 1 tab per day 7 days  13 tablet  0  . montelukast (SINGULAIR) 10 MG tablet Take 1 tablet (10 mg total) by mouth at bedtime.  30 tablet  9  . nystatin (MYCOSTATIN) powder Apply 1 Units topically daily. Applies to skin folds or around stoma as needed for  fungal infection.      . pantoprazole (PROTONIX) 40 MG tablet Take 40 mg by mouth at bedtime.       Marland Kitchen tiotropium (SPIRIVA HANDIHALER) 18 MCG inhalation capsule Place 1 capsule (18 mcg total) into inhaler and inhale daily.  30 capsule  9  . valACYclovir (VALTREX) 1000 MG tablet Take 1 tablet (1,000 mg total) by mouth 3 (three) times daily.  21 tablet  0     INSTRUCTIONS GIVEN AND DISCUSSED:  See attached schedule   SPECIAL INSTRUCTIONS/FOLLOW-UP:  See above.  I acknowledge that I have been informed and understand all the instructions given to me and received a copy.I know to contact the clinic, my physician, or go to the emergency Department if any problems should occur.   I do not have any more questions at this time, but understand that I may call the [facility] at [phone] during business hours should I have any further questions or need assistance in obtaining follow-up care.

## 2012-07-18 NOTE — Progress Notes (Signed)
This office note has been dictated.

## 2012-07-25 ENCOUNTER — Telehealth: Payer: Self-pay

## 2012-07-25 NOTE — Telephone Encounter (Signed)
[name]  was calling to let Dr. Dalene Carrow that she checked My Chart yesterday and she saw her most recent labs, Ct. Scan, flu shot given not showing yet.  My Chart seems to be working ok.

## 2012-07-26 ENCOUNTER — Telehealth: Payer: Self-pay | Admitting: *Deleted

## 2012-07-26 NOTE — Telephone Encounter (Signed)
Called pt at home and spoke with husband.   Informed husband that from My Chart -  Pt can only see lab results.   CT and  Flu will not show up unless md releases these info.  However, md will release CT results only.   Husband voiced understanding, and stated he would relay message to pt.

## 2012-08-27 ENCOUNTER — Encounter (INDEPENDENT_AMBULATORY_CARE_PROVIDER_SITE_OTHER): Payer: Medicare Other | Admitting: General Surgery

## 2012-08-31 ENCOUNTER — Ambulatory Visit (INDEPENDENT_AMBULATORY_CARE_PROVIDER_SITE_OTHER): Payer: Medicare Other | Admitting: General Surgery

## 2012-08-31 ENCOUNTER — Encounter (INDEPENDENT_AMBULATORY_CARE_PROVIDER_SITE_OTHER): Payer: Self-pay | Admitting: General Surgery

## 2012-08-31 VITALS — BP 138/90 | HR 90 | Temp 97.0°F | Resp 18 | Ht 64.0 in | Wt 232.0 lb

## 2012-08-31 DIAGNOSIS — C20 Malignant neoplasm of rectum: Secondary | ICD-10-CM

## 2012-08-31 MED ORDER — PROMETHAZINE HCL 12.5 MG PO TABS: 12.5000 mg | ORAL_TABLET | Freq: Four times a day (QID) | ORAL | Status: DC | PRN

## 2012-08-31 NOTE — Progress Notes (Signed)
HISTORY: This is a 66 year old female who is now one year status post laparoscopic low anterior resection and now 4 months status post ileostomy takedown. She does have a small amount of leakage occasionally. She has frequent small bowel movements that are small in caliber. Over the last few days, she has felt nauseated and has had malaise. She denies fevers or chills or myalgias. She has not actually thrown up but has had dry heaves.   PERTINENT REVIEW OF SYSTEMS: Otherwise negative.     EXAM: Head: Normocephalic and atraumatic.  Eyes:  Conjunctivae are normal. Pupils are equal, round, and reactive to light. No scleral icterus.  Neck:  Normal range of motion.  Resp: No respiratory distress, normal effort. Abd:  Abdomen is soft, non distended and non tender. No masses are palpable.  There is no rebound and no guarding.  Rectal:  No stricture at staple line.   Neurological: Alert and oriented to person, place, and time. Coordination normal.  Skin: Skin is warm and dry. No rash noted. No diaphoretic. No erythema. No pallor.  Psychiatric: Normal mood and affect. Normal behavior. Judgment and thought content normal.      ASSESSMENT AND PLAN:   Rectal cancer, midrectum ypT3ypN0, s/p neoadj chemoXRT, lap LAR with diverting loop ileostomy 08/2011, ileostomy takedown 03/2012 No clinical evidence of disease. Patient had recent scans which were negative for rectal cancer recurrence. The symptoms that she is having were concerning for stricture, but I do not palpate a stricture on examination.  I will see her back in 4-6 months unless she continues having issues.  If she is still having nausea over the weekend, I will get plain films and labs.  She was advised to go to the ED if her symptoms worsen.        Maudry Diego, MD Surgical Oncology, General & Endocrine Surgery [facility]  Illene Regulus, MD Debby Bud Rosalyn Gess, MD

## 2012-08-31 NOTE — Patient Instructions (Signed)
Go to ER if unable to keep things down over the next few days.  Call Monday if no improvement in GI symptoms.  Follow up in 4-6 months otherwise.

## 2012-08-31 NOTE — Assessment & Plan Note (Signed)
No clinical evidence of disease. Patient had recent scans which were negative for rectal cancer recurrence. The symptoms that she is having were concerning for stricture, but I do not palpate a stricture on examination.  I will see her back in 4-6 months unless she continues having issues.  If she is still having nausea over the weekend, I will get plain films and labs.  She was advised to go to the ED if her symptoms worsen.

## 2012-09-04 ENCOUNTER — Ambulatory Visit (INDEPENDENT_AMBULATORY_CARE_PROVIDER_SITE_OTHER): Payer: Medicare Other | Admitting: Internal Medicine

## 2012-09-04 ENCOUNTER — Encounter: Payer: Self-pay | Admitting: Internal Medicine

## 2012-09-04 VITALS — BP 118/62 | HR 94 | Temp 100.4°F | Ht 68.0 in | Wt 229.2 lb

## 2012-09-04 DIAGNOSIS — J45901 Unspecified asthma with (acute) exacerbation: Secondary | ICD-10-CM

## 2012-09-04 DIAGNOSIS — J449 Chronic obstructive pulmonary disease, unspecified: Secondary | ICD-10-CM

## 2012-09-04 MED ORDER — METHYLPREDNISOLONE ACETATE 80 MG/ML IJ SUSP
80.0000 mg | Freq: Once | INTRAMUSCULAR | Status: AC
  Administered 2012-09-04: 80 mg via INTRAMUSCULAR

## 2012-09-04 MED ORDER — LEVOFLOXACIN 500 MG PO TABS: 500.0000 mg | ORAL_TABLET | Freq: Every day | ORAL | Status: DC

## 2012-09-04 MED ORDER — ALBUTEROL SULFATE (2.5 MG/3ML) 0.083% IN NEBU
2.5000 mg | INHALATION_SOLUTION | Freq: Once | RESPIRATORY_TRACT | Status: AC
  Administered 2012-09-04: 2.5 mg via RESPIRATORY_TRACT

## 2012-09-04 MED ORDER — METHYLPREDNISOLONE 4 MG PO TABS: ORAL_TABLET | ORAL | Status: DC

## 2012-09-04 NOTE — Progress Notes (Signed)
Subjective:    Patient ID: [name], female    DOB: [birth year], 66 y.o.   MRN: [id number]  HPI  reports that she quit smoking about 37 years ago. Her smoking use included Cigarettes. She has a 15 pack-year smoking history. She has never used smokeless tobacco.  #Obese  Body mass index is 34.85 kg/(m^2).  #Ex 15 pack smoker. Quit 1970s  #COPD/ASthma - Baseline CAT score 18 in May 2013 (poor score is 4 sleep quality, level 3 for dyspnea/fatigue)  #Pulmonary nodules  -  Colon Cancer during cancer Rx had CT chest Oct 2012 and pulmonary nodules noted   Comparison: PET CT 05/26/2011, CT 10/2010 CT CHEST Oct 2012 Findings: No axillary or supraclavicular lymphadenopathy. No  mediastinal or hilar lymphadenopathy. No pericardial fluid.  Esophagus is normal.  There is an irregular nodule in the right upper lobe measuring 12  mm which is unchanged from prior and has adjacent bronchiectasis  also unchanged. Within the left lower lobe, there is a 4 mm  pulmonary nodule (image 30) which is unchanged. Airways are  normal.  IMPRESSION:  1. No evidence of thoracic metastasis.  2. Stable right upper lobe nodularity and bronchiectasis.  3. Stable small left lower lobe pulmonary nodule.   No change on CT chest Oct 2013   #colon Cancer  - 2012 (early) - s/p surgery and chemo     OV 09/04/2012  Regular visit: but she is sick. On 08/31/12 during visit to [facility] felt nasueated. Then later that day developed diarrhea that lasted through 09/02/12. Aftrer that having increased cough, wheeze, dyspnea and increased sputum volume. Sputum color is unknown because she swallows it. Starting last night febrile, feeling lousy and decreaesd appetite. Does not feel she needs to be admitted and thinks is manageable at home. Febrile in office to 38C. CAT score worse at 38 (baseline CAt score 18)  CAT COPD Symptom & Quality of Life Score (GSK trademark) 0 is no burden. 5 is highest burden 09/04/2012     Never Cough -> Cough all the time 4  No phlegm in chest -> Chest is full of phlegm 4  No chest tightness -> Chest feels very tight 5  No dyspnea for 1 flight stairs/hill -> Very dyspneic for 1 flight of stairs 5  No limitations for ADL at home -> Very limited with ADL at home 5  Confident leaving home -> Not at all confident leaving home 5  Sleep soundly -> Do not sleep soundly because of lung condition 5  Lots of Energy -> No energy at all 5  TOTAL Score (max 40)  38       Review of Systems  Constitutional: Negative for fever and unexpected weight change.  HENT: Negative for ear pain, nosebleeds, congestion, sore throat, rhinorrhea, sneezing, trouble swallowing, dental problem, postnasal drip and sinus pressure.   Eyes: Negative for redness and itching.  Respiratory: Positive for cough, chest tightness, shortness of breath and wheezing.   Cardiovascular: Negative for palpitations and leg swelling.  Gastrointestinal: Negative for nausea and vomiting.  Genitourinary: Negative for dysuria.  Musculoskeletal: Negative for joint swelling.  Skin: Negative for rash.  Neurological: Negative for headaches.  Hematological: Does not bruise/bleed easily.  Psychiatric/Behavioral: Negative for dysphoric mood. The patient is not nervous/anxious.        Objective:   Physical Exam Filed Vitals:   09/04/12 1345  BP: 118/62  Pulse: 94  Temp: 100.4 F (38 C)  TempSrc: Oral  Height: 5'  8" (1.727 m)  Weight: 229 lb 3.2 oz (103.964 kg)  SpO2: 92%    Constitutional: She is oriented to person, place, and time. She appears well-developed and well-nourished. No distress (obese) but LOOKS UNWELL.  HENT:  Head: Normocephalic and atraumatic.  Right Ear: External ear normal.  Left Ear: External ear normal.  Mouth/Throat: Oropharynx is clear and moist. No oropharyngeal exudate.  Eyes: Conjunctivae and EOM are normal. Pupils are equal, round, and reactive to light. Right eye exhibits no discharge.  Left eye exhibits no discharge. No scleral icterus.  Neck: Normal range of motion. Neck supple. No JVD present. No tracheal deviation present. No thyromegaly present.  Cardiovascular: Normal rate, regular rhythm and intact distal pulses.  Exam reveals no gallop and no friction rub.   Murmur heard.      Ejection systolic murmur + Pulmonary/Chest: Effort normal. No respiratory distress.  She exhibits no tenderness.       No distress Full sentences + but bialgteral crackles and wheeze +  Abdominal: Soft. Bowel sounds are normal. She exhibits no distension and no mass. There is no tenderness. There is no rebound and no guarding.  Musculoskeletal: Normal range of motion. She exhibits no edema and no tenderness.  Lymphadenopathy:    She has no cervical adenopathy.  Neurological: She is alert and oriented to person, place, and time. She has normal reflexes. No cranial nerve deficit. She exhibits normal muscle tone. Coordination normal.  Skin: Skin is warm and dry. No rash noted. She is not diaphoretic. No erythema. No pallor.  Psychiatric: She has a normal mood and affect. Her behavior is normal. Judgment and thought content normal.          Assessment & Plan:

## 2012-09-04 NOTE — Patient Instructions (Addendum)
You are having asthma flare with bronchitis +/- pneumonia Have CXR on way home Have nebulizer albuterol in office Take depot medrol 80mg IM x 1 Ttake levaquin 500mg once daily  X 6 days Take medrol (methyl prednisone)  - TAKE 4 TABS DAILY FOR 3 DAYS, 3 TABS DAILY FOR 3 DAYS, 2 TABS DAILY FOR 3 DAYS THEN 1 TAB DAILY FOR 3 DAYS THEN STOP If you get worse, call 911 and go to ER Take tylenol 2 tabs as needed (not to exceed 4gm per day) for fever and body aches Drink plenty of fluids like zero cal powerade or low sugar gatorade (G2) to stay hydrated Rest well Avoid heavy family activities during holiday Take any over the counter cough syrup as needed Return in 3 months  

## 2012-09-08 ENCOUNTER — Telehealth: Payer: Self-pay | Admitting: Internal Medicine

## 2012-09-08 NOTE — Assessment & Plan Note (Signed)
You are having asthma flare with bronchitis +/- pneumonia Have CXR on way home Have nebulizer albuterol in office Take depot medrol 80mg  IM x 1 Ttake levaquin 500mg  once daily  X 6 days Take medrol (methyl prednisone)  - TAKE 4 TABS DAILY FOR 3 DAYS, 3 TABS DAILY FOR 3 DAYS, 2 TABS DAILY FOR 3 DAYS THEN 1 TAB DAILY FOR 3 DAYS THEN STOP If you get worse, call 911 and go to ER Take tylenol 2 tabs as needed (not to exceed 4gm per day) for fever and body aches Drink plenty of fluids like zero cal powerade or low sugar gatorade (G2) to stay hydrated Rest well Avoid heavy family activities during holiday Take any over the counter cough syrup as needed Return in 3 months

## 2012-09-08 NOTE — Telephone Encounter (Signed)
Jen  pls find out if she is doing better  Thanks MR

## 2012-09-11 ENCOUNTER — Other Ambulatory Visit: Payer: Self-pay | Admitting: Internal Medicine

## 2012-09-14 NOTE — Telephone Encounter (Signed)
Pt states she is much improved. I advised if anything changes to call. Carron Curie, CMA

## 2012-09-29 ENCOUNTER — Telehealth: Payer: Self-pay | Admitting: Oncology

## 2012-09-29 NOTE — Telephone Encounter (Signed)
lvm for pt regarding r/s appt....advised pt to call back...former pt of Dr. Marton Redwood reassigned to Dr. Clelia Croft

## 2012-10-01 ENCOUNTER — Telehealth: Payer: Self-pay | Admitting: Oncology

## 2012-10-01 NOTE — Telephone Encounter (Signed)
s.w. pt and advised about Dr. Marton Redwood leaving and her new Dr. Ardyth Gal...pt ok....former pt of Dr LO assigned to Dr. Clelia Croft

## 2012-10-13 ENCOUNTER — Telehealth: Payer: Self-pay | Admitting: Oncology

## 2012-10-13 ENCOUNTER — Encounter: Payer: Self-pay | Admitting: Oncology

## 2012-10-13 NOTE — Telephone Encounter (Signed)
According to documentation patient has been contacted. Letter printed today but not sent

## 2012-11-02 ENCOUNTER — Telehealth: Payer: Self-pay | Admitting: Oncology

## 2012-11-02 NOTE — Telephone Encounter (Signed)
Former pt of LO originally reassigned to [employer]. Per email from [place] pt would like to be reassigned to [facility]. cx'd 2/11 appt w/[facility] and rescheduled to 2/28 w/BS. Pt will keep 2/6 lb appt. S/w pt she is aware of both appt d/t's.

## 2012-11-05 ENCOUNTER — Other Ambulatory Visit (HOSPITAL_COMMUNITY): Payer: Self-pay | Admitting: Cardiovascular Disease

## 2012-11-05 DIAGNOSIS — I050 Rheumatic mitral stenosis: Secondary | ICD-10-CM

## 2012-11-05 DIAGNOSIS — I359 Nonrheumatic aortic valve disorder, unspecified: Secondary | ICD-10-CM

## 2012-11-15 ENCOUNTER — Ambulatory Visit (HOSPITAL_COMMUNITY)
Admission: RE | Admit: 2012-11-15 | Discharge: 2012-11-15 | Disposition: A | Discharge status: Home / Self Care | Payer: Medicare Other | Source: Ambulatory Visit | Attending: Hematology and Oncology | Admitting: Hematology and Oncology

## 2012-11-15 ENCOUNTER — Ambulatory Visit (HOSPITAL_COMMUNITY): Payer: Medicare Other

## 2012-11-15 ENCOUNTER — Other Ambulatory Visit (HOSPITAL_BASED_OUTPATIENT_CLINIC_OR_DEPARTMENT_OTHER): Payer: Medicare Other | Admitting: Lab

## 2012-11-15 DIAGNOSIS — C20 Malignant neoplasm of rectum: Secondary | ICD-10-CM

## 2012-11-15 DIAGNOSIS — I059 Rheumatic mitral valve disease, unspecified: Secondary | ICD-10-CM | POA: Insufficient documentation

## 2012-11-15 LAB — CBC WITH DIFFERENTIAL/PLATELET
BASO%: 1.3 % (ref 0.0–2.0)
Basophils Absolute: 0.1 10*3/uL (ref 0.0–0.1)
EOS%: 3.3 % (ref 0.0–7.0)
Eosinophils Absolute: 0.2 10*3/uL (ref 0.0–0.5)
HCT: 39.4 % (ref 34.8–46.6)
HGB: 13.7 g/dL (ref 11.6–15.9)
LYMPH%: 17.8 % (ref 14.0–49.7)
MCH: 32.3 pg (ref 25.1–34.0)
MCHC: 34.7 g/dL (ref 31.5–36.0)
MCV: 93.1 fL (ref 79.5–101.0)
MONO#: 0.5 10*3/uL (ref 0.1–0.9)
MONO%: 10.1 % (ref 0.0–14.0)
NEUT#: 3.4 10*3/uL (ref 1.5–6.5)
NEUT%: 67.5 % (ref 38.4–76.8)
Platelets: 191 10*3/uL (ref 145–400)
RBC: 4.23 10*6/uL (ref 3.70–5.45)
RDW: 14.4 % (ref 11.2–14.5)
WBC: 5 10*3/uL (ref 3.9–10.3)
lymph#: 0.9 10*3/uL (ref 0.9–3.3)

## 2012-11-15 LAB — COMPREHENSIVE METABOLIC PANEL (CC13)
ALT: 12 U/L (ref 0–55)
AST: 17 U/L (ref 5–34)
Albumin: 3.3 g/dL — ABNORMAL LOW (ref 3.5–5.0)
Alkaline Phosphatase: 79 U/L (ref 40–150)
BUN: 23.1 mg/dL (ref 7.0–26.0)
CO2: 27 mEq/L (ref 22–29)
Calcium: 9.2 mg/dL (ref 8.4–10.4)
Chloride: 104 mEq/L (ref 98–107)
Creatinine: 0.9 mg/dL (ref 0.6–1.1)
Glucose: 93 mg/dl (ref 70–99)
Potassium: 4 mEq/L (ref 3.5–5.1)
Sodium: 141 mEq/L (ref 136–145)
Total Bilirubin: 0.6 mg/dL (ref 0.20–1.20)
Total Protein: 6.9 g/dL (ref 6.4–8.3)

## 2012-11-16 ENCOUNTER — Ambulatory Visit (HOSPITAL_COMMUNITY)
Admission: RE | Admit: 2012-11-16 | Discharge: 2012-11-16 | Disposition: A | Discharge status: Home / Self Care | Payer: Medicare Other | Source: Ambulatory Visit | Attending: Cardiovascular Disease | Admitting: Cardiovascular Disease

## 2012-11-16 DIAGNOSIS — I369 Nonrheumatic tricuspid valve disorder, unspecified: Secondary | ICD-10-CM | POA: Insufficient documentation

## 2012-11-16 DIAGNOSIS — I359 Nonrheumatic aortic valve disorder, unspecified: Secondary | ICD-10-CM

## 2012-11-16 DIAGNOSIS — I080 Rheumatic disorders of both mitral and aortic valves: Secondary | ICD-10-CM | POA: Insufficient documentation

## 2012-11-16 DIAGNOSIS — I050 Rheumatic mitral stenosis: Secondary | ICD-10-CM

## 2012-11-16 LAB — CEA: CEA: <0.5 ng/mL (ref 0.0–5.0)

## 2012-11-16 NOTE — Progress Notes (Signed)
[facility]   2D echo completed 11/16/2012.   Cindy Vasilios [place], RDCS   

## 2012-11-20 ENCOUNTER — Ambulatory Visit: Payer: Self-pay | Admitting: Oncology

## 2012-11-20 ENCOUNTER — Ambulatory Visit: Payer: Medicare Other | Admitting: Hematology and Oncology

## 2012-11-27 ENCOUNTER — Other Ambulatory Visit: Payer: Self-pay | Admitting: Internal Medicine

## 2012-11-27 DIAGNOSIS — Z1231 Encounter for screening mammogram for malignant neoplasm of breast: Secondary | ICD-10-CM

## 2012-12-07 ENCOUNTER — Telehealth: Payer: Self-pay | Admitting: Oncology

## 2012-12-07 ENCOUNTER — Ambulatory Visit (HOSPITAL_BASED_OUTPATIENT_CLINIC_OR_DEPARTMENT_OTHER): Payer: Medicare Other | Admitting: Oncology

## 2012-12-07 VITALS — BP 180/68 | HR 77 | Temp 97.5°F | Resp 18 | Ht 68.0 in | Wt 234.2 lb

## 2012-12-07 DIAGNOSIS — C20 Malignant neoplasm of rectum: Secondary | ICD-10-CM

## 2012-12-07 DIAGNOSIS — J449 Chronic obstructive pulmonary disease, unspecified: Secondary | ICD-10-CM

## 2012-12-07 NOTE — Telephone Encounter (Signed)
gv and printed appt schedule for pt for Aug °

## 2012-12-07 NOTE — Progress Notes (Signed)
   [name]    OFFICE PROGRESS NOTE   INTERVAL HISTORY:   She has been followed by Dr.Odogwu with a history of rectal cancer. She reports feeling well. She has irregular bowel habits. She last underwent a colonoscopy in June of 2013 prior to the ileostomy takedown procedure. No new complaint.  Objective:  Vital signs in last 24 hours:  Blood pressure 180/68, pulse 77, temperature 97.5 F (36.4 C), temperature source Oral, resp. rate 18, height 5\' 8"  (1.727 m), weight 234 lb 3.2 oz (106.232 kg).    HEENT: Neck without mass Lymphatics: No cervical, supraclavicular, axillary, or inguinal nodes Resp: Bilateral expiratory wheeze, no respiratory distress Cardio: Regular rate and rhythm, 2/6 systolic murmur GI: No hepatomegaly, nontender, no mass. Vascular: No leg edema  Skin: Yeast rash in the groin bilaterally     Lab Results:  Lab Results  Component Value Date   WBC 5.0 11/15/2012   HGB 13.7 11/15/2012   HCT 39.4 11/15/2012   MCV 93.1 11/15/2012   PLT 191 11/15/2012   ANC 3.4  CEA less than 0.5  X-ray: Chest x-ray 11/15/2012-no acute findings  Medications: I have reviewed the patient's current medications.  Assessment/Plan:  1. Rectal cancer, u T3 N1, diagnosed in August 2012, status post neoadjuvant Xeloda/radiation 05/30/2011 through 07/07/2011. 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 Ileostomy takedown 04/02/2012  3. COPD  Disposition:  She remains in clinical remission from rectal cancer. She will return for an office visit and CEA in 6 months. She is scheduled for colonoscopy with Dr. Christella Hartigan in June of 2014.   Thornton Papas, MD  12/07/2012  9:47 AM

## 2012-12-10 ENCOUNTER — Ambulatory Visit: Payer: Medicare Other | Admitting: Internal Medicine

## 2013-01-01 ENCOUNTER — Encounter: Payer: Self-pay | Admitting: Internal Medicine

## 2013-01-01 ENCOUNTER — Ambulatory Visit (INDEPENDENT_AMBULATORY_CARE_PROVIDER_SITE_OTHER): Payer: Medicare Other | Admitting: Internal Medicine

## 2013-01-01 VITALS — BP 130/70 | HR 73 | Temp 97.6°F | Ht 68.0 in | Wt 243.0 lb

## 2013-01-01 DIAGNOSIS — J4489 Other specified chronic obstructive pulmonary disease: Secondary | ICD-10-CM

## 2013-01-01 DIAGNOSIS — J449 Chronic obstructive pulmonary disease, unspecified: Secondary | ICD-10-CM

## 2013-01-01 DIAGNOSIS — J441 Chronic obstructive pulmonary disease with (acute) exacerbation: Secondary | ICD-10-CM

## 2013-01-01 MED ORDER — PREDNISONE 10 MG PO TABS: ORAL_TABLET | ORAL | Status: DC

## 2013-01-01 NOTE — Patient Instructions (Addendum)
#  COPD  - You have mild wheezing even though you feel well Take prednisone 40 mg daily x 2 days, then 20mg  daily x 2 days, then 10mg  daily x 2 days, then 5mg  daily x 2 days and stop I will re\re refer you to pulmonary rehabilitation, you can start that for maintenance program Start N. acetylcysteine 600 mg twice a day  - [place] study published in Lancet British Journal in 2014 shows it helps   - this is not to make you feel better but to to prevent recurrent bronchitis episodes  - You are more likely to get this drug at [facility] or a vitamin store then at [employer] or [employer] or [employer]  - You can also get this at website ConstitutionJournal.co.uk  - It functions as an anti-oxidant and there are minimal/none side effects   #Followup 4-5 months with COPD cat score at followup

## 2013-01-01 NOTE — Progress Notes (Signed)
Subjective:    Patient ID: [name], female    DOB: Feb 09, 1946, 67 y.o.   MRN: [id number]  HPI reports that she quit smoking about 37 years ago. Her smoking use included Cigarettes. She has a 15 pack-year smoking history. She has never used smokeless tobacco.  #Obese  Body mass index is 34.85 kg/(m^2).  #Ex 15 pack smoker. Quit 1970s  #COPD/ASthma - Baseline CAT score 18 in May 2013 (poor score is 4 sleep quality, level 3 for dyspnea/fatigue)  #Pulmonary nodules  -  Colon Cancer during cancer Rx had CT chest Oct 2012 and pulmonary nodules noted  - October 2012: There is an irregular nodule in the right upper lobe measuring 12 mm which is unchanged from prior and has adjacent bronchiectasis also unchanged. Within the left lower lobe, there is a 4 mm pulmonary nodule (image 30) which is unchanged.  - October 2013: No change on CT chest Oct 2013   #colon Cancer  - 2012 (early) - s/p surgery and chemo  #Arctic valve stenosis  0 Dr. Alanda Amass  #Exacerbations of obstructive lung disease   - November 2013: Treatment from office with Levaquin and Medrol Dosepak      OV .01/01/2013  Follows up for COPD/asthma.  - She is reporting chronic worsening of dyspnea for the last several months but the worsening is mild overall that she feels stable. In fact on COPD cat score is only 9 and is much better than baseline of 18 and also much improved compared to 38 at last visit in November 2013 when she was in an exacerbation. She says that her aortic  valve stenosis is stable and is being monitored by [facility] cardiology Dr. Alanda Amass. She feels deconditioned and is wondering if she needs to be attend pulmonary rehabilitation maintenance program. She denies any overt wheezing or worsening cough or sputum but on exam she was actively wheezing this visit   CAT COPD Symptom & Quality of Life Score (GSK trademark) 0 is no burden. 5 is highest burden 09/04/2012 Exacerbation  01/01/2013    Never Cough -> Cough all the time 4 2  No phlegm in chest -> Chest is full of phlegm 4 0.5  No chest tightness -> Chest feels very tight 5 0.5  No dyspnea for 1 flight stairs/hill -> Very dyspneic for 1 flight of stairs 5 3.5  No limitations for ADL at home -> Very limited with ADL at home 5 1.5  Confident leaving home -> Not at all confident leaving home 5 0  Sleep soundly -> Do not sleep soundly because of lung condition 5 0  Lots of Energy -> No energy at all 5 1  TOTAL Score (max 40)  38 9     Review of Systems  Constitutional: Negative for fever and unexpected weight change.  HENT: Negative for ear pain, nosebleeds, congestion, sore throat, rhinorrhea, sneezing, trouble swallowing, dental problem, postnasal drip and sinus pressure.   Eyes: Negative for redness and itching.  Respiratory: Negative for cough, chest tightness, shortness of breath and wheezing.   Cardiovascular: Negative for palpitations and leg swelling.  Gastrointestinal: Negative for nausea and vomiting.  Genitourinary: Negative for dysuria.  Musculoskeletal: Negative for joint swelling.  Skin: Negative for rash.  Neurological: Negative for headaches.  Hematological: Does not bruise/bleed easily.  Psychiatric/Behavioral: Negative for dysphoric mood. The patient is not nervous/anxious.        Objective:   Physical Exam Filed Vitals:   01/01/13 1540  BP: 130/70  Pulse: 73  Temp: 97.6 F (36.4 C)  TempSrc: Oral  Height: 5\' 8"  (1.727 m)  Weight: 243 lb (110.224 kg)  SpO2: 94%      Constitutional: She is oriented to person, place, and time. She appears well-developed and well-nourished. No distress (obese) but LOOKS UNWELL.  HENT:  Head: Normocephalic and atraumatic.  Right Ear: External ear normal.  Left Ear: External ear normal.  Mouth/Throat: Oropharynx is clear and moist. No oropharyngeal exudate.  Eyes: Conjunctivae and EOM are normal. Pupils are equal, round, and reactive to light. Right eye  exhibits no discharge. Left eye exhibits no discharge. No scleral icterus.  Neck: Normal range of motion. Neck supple. No JVD present. No tracheal deviation present. No thyromegaly present.  Cardiovascular: Normal rate, regular rhythm and intact distal pulses.  Exam reveals no gallop and no friction rub.   Murmur heard.      Ejection systolic murmur + Pulmonary/Chest: Effort normal. No respiratory distress.  She exhibits no tenderness.       No distress  bilateral wheezing present Abdominal: Soft. Bowel sounds are normal. She exhibits no distension and no mass. There is no tenderness. There is no rebound and no guarding.  Musculoskeletal: Normal range of motion. She exhibits no edema and no tenderness.  Lymphadenopathy:    She has no cervical adenopathy.  Neurological: She is alert and oriented to person, place, and time. She has normal reflexes. No cranial nerve deficit. She exhibits normal muscle tone. Coordination normal.  Skin: Skin is warm and dry. No rash noted. She is not diaphoretic. No erythema. No pallor.  Psychiatric: She has a normal mood and affect. Her behavior is normal. Judgment and thought content normal.           Assessment & Plan:

## 2013-01-04 NOTE — Assessment & Plan Note (Signed)
#  COPD  - You have mild wheezing even though you feel well Take prednisone 40 mg daily x 2 days, then 20mg daily x 2 days, then 10mg daily x 2 days, then 5mg daily x 2 days and stop I will re\re refer you to pulmonary rehabilitation, you can start that for maintenance program Start N. acetylcysteine 600 mg twice a day  - [place] study published in Lancet British Journal in 2014 shows it helps   - this is not to make you feel better but to to prevent recurrent bronchitis episodes  - You are more likely to get this drug at [facility] or a vitamin store then at [employer] or [facility] or [employer]  - You can also get this at website www.vitacost.com  - It functions as an anti-oxidant and there are minimal/none side effects   #Followup 4-5 months with COPD cat score at followup 

## 2013-01-08 ENCOUNTER — Encounter: Payer: Self-pay | Admitting: Gastroenterology

## 2013-01-11 ENCOUNTER — Encounter (INDEPENDENT_AMBULATORY_CARE_PROVIDER_SITE_OTHER): Payer: Self-pay | Admitting: General Surgery

## 2013-01-11 ENCOUNTER — Ambulatory Visit (INDEPENDENT_AMBULATORY_CARE_PROVIDER_SITE_OTHER): Payer: Medicare Other | Admitting: General Surgery

## 2013-01-11 VITALS — BP 126/80 | HR 79 | Temp 98.0°F | Resp 18 | Ht 68.0 in | Wt 239.0 lb

## 2013-01-11 DIAGNOSIS — C20 Malignant neoplasm of rectum: Secondary | ICD-10-CM

## 2013-01-11 NOTE — Assessment & Plan Note (Signed)
Pt with no clinical evidence of disease.  Advised to take 1/2 cap miralax every other day.  She is getting colonoscopy in June of 2014.   She has follow up with Dr. Truett Perna this summer.  Follow up with me in 6 months.

## 2013-01-11 NOTE — Progress Notes (Signed)
HISTORY: Patient is a 67 year old female who is approximately one and a half years status post laparoscopic low anterior resection for rectal cancer. She is doing well overall. She does have some bowel irregularity. She says she will go maybe 8-10 times a day and then not have any bowel movements for several days.  She has no rectal bleeding.  She has not had any rectal pain or weight loss.  She denies nausea or vomiting.  She has had no jaundice.     PERTINENT REVIEW OF SYSTEMS: Some wheezing with pulmonologist.     EXAM: Head: Normocephalic and atraumatic.  Eyes:  Conjunctivae are normal. Pupils are equal, round, and reactive to light. No scleral icterus.  Neck:  Normal range of motion. Neck supple. No tracheal deviation present. No thyromegaly present. No lymphadenopathy.   Resp: No respiratory distress, normal effort. Abd:  Abdomen is soft, non distended and non tender. No masses are palpable.  There is no rebound and no guarding.  Neurological: Alert and oriented to person, place, and time. Coordination normal.  Skin: Skin is warm and dry. No rash noted. No diaphoretic. No erythema. No pallor.  Psychiatric: Normal mood and affect. Normal behavior. Judgment and thought content normal.      ASSESSMENT AND PLAN:   Rectal cancer, midrectum ypT3ypN0, s/p neoadj chemoXRT, lap LAR with diverting loop ileostomy 08/2011, ileostomy takedown 03/2012 Pt with no clinical evidence of disease.  Advised to take 1/2 cap miralax every other day.  She is getting colonoscopy in June of 2014.   She has follow up with Dr. Truett Perna this summer.  Follow up with me in 6 months.        Maudry Diego, MD Surgical Oncology, General & Endocrine Surgery [facility]  Illene Regulus, MD Debby Bud Rosalyn Gess, MD

## 2013-01-11 NOTE — Patient Instructions (Signed)
Get mammogram and colonoscopy as scheduled.  Follow up with Dr. Truett Perna.  See me back in 6 months.

## 2013-01-14 ENCOUNTER — Encounter: Payer: Self-pay | Admitting: Internal Medicine

## 2013-01-14 ENCOUNTER — Ambulatory Visit (INDEPENDENT_AMBULATORY_CARE_PROVIDER_SITE_OTHER): Payer: Medicare Other | Admitting: Internal Medicine

## 2013-01-14 VITALS — BP 160/70 | HR 77 | Temp 98.1°F | Resp 16 | Ht 68.0 in | Wt 237.0 lb

## 2013-01-14 DIAGNOSIS — J441 Chronic obstructive pulmonary disease with (acute) exacerbation: Secondary | ICD-10-CM

## 2013-01-14 MED ORDER — LEVOFLOXACIN 500 MG PO TABS: 500.0000 mg | ORAL_TABLET | Freq: Every day | ORAL | Status: DC

## 2013-01-14 MED ORDER — METHYLPREDNISOLONE 4 MG PO KIT: PACK | ORAL | Status: AC

## 2013-01-14 NOTE — Assessment & Plan Note (Signed)
[name] presents with 3 day history of respiratory illness that began Sat with nausea, fever, chills. She has continued to have progressive laryngitis and cough with increased wheezing. This is very similar to her previous episodes of bronchitis.  Plan levaquin 500 mg daily x 7 days  Medrol dose-pak  Gargle of choice  Tylenol for fever and aches.  

## 2013-01-14 NOTE — Progress Notes (Signed)
  Subjective:    Patient ID: [name], female    DOB: 01-24-1946, 67 y.o.   MRN: [id number]  HPI [name] presents with a 3 day h/o URI symptoms that began with nausea, chills and weakness on Saturday. Her symptoms have progressed to laryngitis, post-nasal drainage, cough and increased wheezing. This is very similar to previous bronchitic exacerbations of her asthma. She was last treated for similar symptoms in Nov '13.  PMH, FamHx and SocHx reviewed for any changes and relevance.  Current Outpatient Prescriptions on File Prior to Visit  Medication Sig Dispense Refill  . albuterol (VENTOLIN HFA) 108 (90 BASE) MCG/ACT inhaler Inhale 2 puffs into the lungs every 6 (six) hours as needed for wheezing.  1 Inhaler  1  . aspirin 81 MG tablet Take 81 mg by mouth at bedtime.       . Calcium Carbonate-Vitamin D (CALCIUM 600 + D PO) Take 1,200 mg by mouth at bedtime.       Marland Kitchen diltiazem (CARDIZEM CD) 120 MG 24 hr capsule Take 120 mg by mouth at bedtime.       . diphenoxylate-atropine (LOMOTIL) 2.5-0.025 MG per tablet 1 tablet as needed.       . Fluticasone-Salmeterol (ADVAIR DISKUS) 100-50 MCG/DOSE AEPB Inhale 1 puff into the lungs 2 (two) times daily.  60 each  9  . HYDROcodone-acetaminophen (NORCO) 10-325 MG per tablet Take 0.5 tablets by mouth every 6 (six) hours as needed.      Marland Kitchen losartan (COZAAR) 50 MG tablet 50 mg at bedtime.       . lovastatin (MEVACOR) 40 MG tablet take 1 tablet by mouth once daily  30 tablet  3  . montelukast (SINGULAIR) 10 MG tablet Take 1 tablet (10 mg total) by mouth at bedtime.  30 tablet  9  . pantoprazole (PROTONIX) 40 MG tablet Take 40 mg by mouth at bedtime.       . promethazine (PHENERGAN) 12.5 MG tablet Take 1-2 tablets (12.5-25 mg total) by mouth every 6 (six) hours as needed for nausea.  30 tablet  0  . tiotropium (SPIRIVA HANDIHALER) 18 MCG inhalation capsule Place 1 capsule (18 mcg total) into inhaler and inhale daily.  30 capsule  9   No current  facility-administered medications on file prior to visit.      Review of Systems System review is negative for any constitutional, cardiac, pulmonary, GI or neuro symptoms or complaints other than as described in the HPI.     Objective:   Physical Exam Filed Vitals:   01/14/13 1626  BP: 160/70  Pulse: 77  Temp: 98.1 F (36.7 C)  Resp: 16   Wt Readings from Last 3 Encounters:  01/14/13 237 lb (107.502 kg)  01/11/13 239 lb (108.41 kg)  01/01/13 243 lb (110.224 kg)   Gen'l- overweight white woman in no acute distress. She is very hoarse HEENT- TM's normal, throat with erythema w/o exudate Neck - supple Nodes - negative Cor - II/VI systolic murmurs heard at LSB and RSB Pul - good breath sounds. End expiratory wheezing upper fields Neuro - A&O x 3       Assessment & Plan:

## 2013-01-14 NOTE — Patient Instructions (Addendum)
Ms. Habel presents with 3 day history of respiratory illness that began Sat with nausea, fever, chills. She has continued to have progressive laryngitis and cough with increased wheezing. This is very similar to her previous episodes of bronchitis.  Plan levaquin 500 mg daily x 7 days  Medrol dose-pak  Gargle of choice  Tylenol for fever and aches.

## 2013-01-17 ENCOUNTER — Ambulatory Visit
Admission: RE | Admit: 2013-01-17 | Discharge: 2013-01-17 | Disposition: A | Discharge status: Home / Self Care | Payer: Medicare Other | Source: Ambulatory Visit | Attending: Internal Medicine | Admitting: Internal Medicine

## 2013-01-17 DIAGNOSIS — Z1231 Encounter for screening mammogram for malignant neoplasm of breast: Secondary | ICD-10-CM

## 2013-01-20 ENCOUNTER — Other Ambulatory Visit: Payer: Self-pay | Admitting: Internal Medicine

## 2013-02-27 ENCOUNTER — Other Ambulatory Visit: Payer: Self-pay | Admitting: Internal Medicine

## 2013-03-05 ENCOUNTER — Ambulatory Visit (AMBULATORY_SURGERY_CENTER): Payer: Medicare Other

## 2013-03-05 ENCOUNTER — Encounter: Payer: Self-pay | Admitting: Gastroenterology

## 2013-03-05 VITALS — Ht 68.0 in | Wt 241.6 lb

## 2013-03-05 DIAGNOSIS — Z85048 Personal history of other malignant neoplasm of rectum, rectosigmoid junction, and anus: Secondary | ICD-10-CM

## 2013-03-05 DIAGNOSIS — R194 Change in bowel habit: Secondary | ICD-10-CM

## 2013-03-05 DIAGNOSIS — R198 Other specified symptoms and signs involving the digestive system and abdomen: Secondary | ICD-10-CM

## 2013-03-05 DIAGNOSIS — Z1211 Encounter for screening for malignant neoplasm of colon: Secondary | ICD-10-CM

## 2013-03-05 MED ORDER — MOVIPREP 100 G PO SOLR: ORAL | Status: DC

## 2013-03-10 HISTORY — PX: COLONOSCOPY: SHX174

## 2013-03-13 ENCOUNTER — Encounter: Payer: Self-pay | Admitting: Surgery

## 2013-03-13 ENCOUNTER — Institutional Professional Consult (permissible substitution) (INDEPENDENT_AMBULATORY_CARE_PROVIDER_SITE_OTHER): Payer: Medicare Other | Admitting: Surgery

## 2013-03-13 VITALS — BP 170/71 | HR 69 | Resp 20 | Ht 68.0 in | Wt 238.0 lb

## 2013-03-13 DIAGNOSIS — I059 Rheumatic mitral valve disease, unspecified: Secondary | ICD-10-CM

## 2013-03-13 DIAGNOSIS — I359 Nonrheumatic aortic valve disorder, unspecified: Secondary | ICD-10-CM

## 2013-03-13 DIAGNOSIS — I350 Nonrheumatic aortic (valve) stenosis: Secondary | ICD-10-CM

## 2013-03-13 DIAGNOSIS — Z8679 Personal history of other diseases of the circulatory system: Secondary | ICD-10-CM

## 2013-03-13 NOTE — Progress Notes (Signed)
PCP is Illene Regulus, MD Referring Provider is Governor Rooks, MD  Chief Complaint  Patient presents with  . 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 She was treated with chemotherapy and radiation therapy. She has been followed by Dr. Alanda Amass for her valvular heart disease and was seen back recently on 03/01/2013. Her last 2-D echocardiogram on 11/16/2012 was reviewed by me. There was a technically difficult study but showed severe aortic stenosis with a moderately calcified aortic annulus. There was moderate calcific mitral stenosis and the left ventricular ejection fraction was 55-60% with normal wall motion. It was not possible to see detail of the valve structure.  Her main symptom is exertional dyspnea. 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 She get short of breath with going up stairs. She denies any dizziness or syncope. She has had no chest discomfort.  Past Medical History  Diagnosis Date  . IBS (irritable bowel syndrome)   . Hyperlipidemia   . Allergic rhinitis   . History of pneumonia   . COPD (chronic obstructive pulmonary disease)   . Asthma   . Heart murmur     Aortic  . Lung nodule     RLL nodule-67mm stable 2006, April 2009, and June 2009  . Candidiasis of breast   . Anemia   . Hyperlipemia   . Obesity   . Unspecified essential hypertension   . Heart valve problem     PER CARDIOLOGIST 05-03-2011  . Sleep difficulties   . Hemorrhoids   . Cancer 04/2011     rectal  . Sleep apnea     PT TOLD BORDERLINE-TRIED CPAP-DID NOT HELP-SHE DOES NOT USE CPAP  . Arthritis   . 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 History of radiation therapy 05/30/11 to 07/07/11    rectum  . Prophylactic chemotherapy     Will finish chemo on 03/04/12  . Pneumonia     hx of several times   . Shingles     Past Surgical History  Procedure Laterality Date  . Foot surgery  left foot    hammer toe  . Hand surgery  Bil    carpal tunnel  . Back surgery      2006 SPACER---2007 LUMBAR FUSION  . Tonsillectomy    . Colon resection  08/25/2011    Procedure: COLON RESECTION LAPAROSCOPIC;  Surgeon: [name], MD;  Location: [facility];  Service: General;  Laterality: N/A;  Laparoscopic Assisted Low Anterior Resection Diverting Ostomy and onQ pain pump  . Ileostomy  08/25/2011  . Colon surgery    . Ileostomy closure  04/02/2012    Procedure: ILEOSTOMY TAKEDOWN;  Surgeon: [name], MD;  Location: [facility];  Service: General;  Laterality: N/A;  . Bowel resection  04/02/2012    Procedure: SMALL BOWEL RESECTION;  Surgeon: [name], MD;  Location: [facility];  Service: General;  Laterality: N/A;    Family History  Problem Relation Age of Onset  .  Lung cancer Father   . Cancer Father     lung  . Diabetes    . Heart disease    . Heart disease Mother   . Diabetes Mother   . Hypertension Mother   . Colon cancer Neg Hx   . Esophageal cancer Neg Hx   . Stomach cancer Neg Hx   . Rectal cancer Neg Hx     Social History History  Substance Use Topics  . Smoking status: Former Smoker -- 1.50 packs/day for 10 years    Types: Cigarettes    Quit date: 12/27/1974  . Smokeless tobacco: Never Used  . Alcohol Use: No     Comment: rare    Current Outpatient Prescriptions  Medication Sig Dispense Refill  . ADVAIR DISKUS 100-50 MCG/DOSE AEPB inhale 1 dose INTO THE LUNGS twice a day  60 each  9  . albuterol (VENTOLIN HFA) 108 (90 BASE) MCG/ACT inhaler Inhale 2 puffs into the lungs every  6 (six) hours as needed for wheezing.  1 Inhaler  1  . aspirin 81 MG tablet Take 81 mg by mouth at bedtime.       . Calcium Carbonate-Vitamin D (CALCIUM 600 + D PO) Take 1,200 mg by mouth at bedtime.       Marland Kitchen diltiazem (CARDIZEM CD) 120 MG 24 hr capsule Take 120 mg by mouth at bedtime.       . diphenoxylate-atropine (LOMOTIL) 2.5-0.025 MG per tablet 1 tablet as needed.       Marland Kitchen losartan (COZAAR) 50 MG tablet 50 mg at bedtime.       . lovastatin (MEVACOR) 40 MG tablet take 1 tablet by mouth once daily  30 tablet  11  . montelukast (SINGULAIR) 10 MG tablet take 1 tablet by mouth at bedtime  30 tablet  9  . MOVIPREP 100 G SOLR Moviprep as directed / no substitutions  1 kit  0  . pantoprazole (PROTONIX) 40 MG tablet Take 40 mg by mouth at bedtime.       Marland Kitchen SPIRIVA HANDIHALER 18 MCG inhalation capsule place 1 capsule INTO INHALER and inhale daily  30 capsule  9   No current facility-administered medications for this visit.    Allergies  Allergen Reactions  . Codeine Nausea Only  . Prednisone Hives    In different places all over the body.  . Sulfonamide Derivatives Nausea Only    Review of Systems  Constitutional: Positive for activity change and fatigue. Negative for fever, chills, appetite change and unexpected weight change.  HENT: Negative.   Eyes:       Wears glasses.  Respiratory: Positive for shortness of breath and wheezing.        Adult onset asthma.  Cardiovascular: Positive for palpitations and leg swelling. Negative for chest pain.  Gastrointestinal: Negative.   Endocrine: Negative.   Genitourinary: Negative.   Musculoskeletal: Positive for arthralgias.  Skin: Negative.   Allergic/Immunologic: Negative.   Neurological: Negative.   Hematological: Negative.   Psychiatric/Behavioral: Negative.     BP 170/71  Pulse 69  Resp 20  Ht 5\' 8"  (1.727 m)  Wt 238 lb (107.956 kg)  BMI 36.2 kg/m2  SpO2 97% Physical Exam  Constitutional: She is oriented to person, place, and  time. She appears well-developed and well-nourished. No distress.  HENT:  Head: Normocephalic and atraumatic.  Mouth/Throat: Oropharynx is clear and moist.  Eyes: Conjunctivae and EOM are normal. Pupils are equal, round, and reactive to light.  Neck: Normal range  of motion. Neck supple. No JVD present. No thyromegaly present.  Bilateral cervical radiation of murmur with slowed carotid upstrokes.  Cardiovascular: Normal rate and regular rhythm.   Murmur heard. 3/6 crescendo/decrescendo mumur heard throughout precordium. + S4.  Pulmonary/Chest: Effort normal. She has wheezes.  Abdominal: Soft. Bowel sounds are normal. She exhibits no distension and no mass. There is no tenderness.  Multiple old abdominal scars.  Musculoskeletal: Normal range of motion. She exhibits no edema and no tenderness.  Lymphadenopathy:    She has no cervical adenopathy.  Neurological: She is alert and oriented to person, place, and time. She has normal strength. No cranial nerve deficit or sensory deficit.  Skin: Skin is warm and dry.  Psychiatric: She has a normal mood and affect.     Diagnostic Tests:  *Cardiovascular Imaging at [facility]               [address]                     [place], [place] [address]                         [phone]   ------------------------------------------------------------ Transthoracic Echocardiography  Patient:    [name], [name] MR #:       [id number] Study Date: 11/16/2012 Gender:     F Age:        1 Height:     172.7cm Weight:     103.9kg BSA:        2.62m^2 Pt. Status: Room:    ATTENDING    Susa Griffins, MD [facility]  ORDERING     Susa Griffins, MD [facility]  PERFORMING   Susa Griffins, MD [facility]  REFERRING    Susa Griffins, MD [facility]  SONOGRAPHER  [place]ro Kung cc:  ------------------------------------------------------------ LV EF: 55% -   60%  ------------------------------------------------------------ Indications:       Aortic stenosis /insufficiency 424.1.  ------------------------------------------------------------ History:   PMH:   Dyspnea.  Mitral stenosis.  Aortic valve disease.  Risk factors:  Former tobacco use. Dyslipidemia.   ------------------------------------------------------------ Study Conclusions  - Left ventricle: The cavity size was normal. There was   moderate concentric hypertrophy. Systolic function was   normal. The estimated ejection fraction was in the range   of 55% to 60%. Wall motion was normal; there were no   regional wall motion abnormalities. Doppler parameters are   consistent with abnormal left ventricular relaxation   (grade 1 diastolic dysfunction). - Aortic valve: Moderately calcified annulus. Trileaflet.   There was severe stenosis. Mild regurgitation. Valve area:   0.88cm^2(VTI). Valve area: 1.06cm^2 (Vmax). - Mitral valve: Calcified annulus. Valve area by pressure   half-time: 1.38cm^2. Valve area by continuity equation   (using LVOT flow): 1.79cm^2. - Left atrium: The appendage was moderately dilated. - Right atrium: The atrium was mildly dilated. - Atrial septum: A septal defect cannot be excluded. A   patent foramen ovale cannot be excluded. - Pulmonary arteries: PA peak pressure: 31mm Hg (S). - Pericardium, extracardiac: There was a right pleural   effusion. Impressions:  - Technically very difficult study. The right ventricular   systolic pressure was increased consistent with mild   pulmonary hypertension.   Modewrate calcificf MS aned severe AS with valve opening   seen. Cannot tell if there is Rheumatic MV deformity.   Rhae Hammock may be helpful in f/u. Transthoracic echocardiography.  M-mode, complete 2D, spectral Doppler, and color Doppler.  Height:  Height: 172.7cm. Height: 68in.  Weight:  Weight: 103.9kg. Weight: 228.5lb.  Body mass index:  BMI: 34.8kg/m^2.  Body surface area:    BSA: 2.29m^2.  Blood pressure:     140/80.  Patient status:   Outpatient.  Location:  Echo laboratory.  ------------------------------------------------------------  ------------------------------------------------------------ Left ventricle:  The cavity size was normal. There was moderate concentric hypertrophy. Systolic function was normal. The estimated ejection fraction was in the range of 55% to 60%. Wall motion was normal; there were no regional wall motion abnormalities. Doppler parameters are consistent with abnormal left ventricular relaxation (grade 1 diastolic dysfunction).  ------------------------------------------------------------ Aortic valve:  Poorly visualized but opening seen. Moderately calcified annulus. Trileaflet.  Doppler:   There was severe stenosis.    Mild regurgitation.    Valve area: 0.88cm^2(VTI). Indexed valve area: 0.41cm^2/m^2 (VTI). Peak velocity ratio of LVOT to aortic valve: 0.34. Valve area: 1.06cm^2 (Vmax). Indexed valve area: 0.49cm^2/m^2 (Vmax). Mean gradient: 44mm Hg (S). Peak gradient: 80mm Hg (S).  ------------------------------------------------------------ Aorta:  The aorta was normal, not dilated, and non-diseased.   ------------------------------------------------------------ Mitral valve:  Mitral leaflets poorly visualized with heavy annular calcification. Moderate MS.  Calcified annulus. No echocardiographic evidence for prolapse.  Doppler:   Trivial regurgitation.    Valve area by pressure half-time: 1.38cm^2. Indexed valve area by pressure half-time: 0.64cm^2/m^2. Valve area by continuity equation (using LVOT flow): 1.79cm^2. Indexed valve area by continuity equation (using LVOT flow): 0.83cm^2/m^2.    Mean gradient: 6mm Hg (D). Peak gradient: 12mm Hg (D).  ------------------------------------------------------------ Left atrium:  LA Volume/BSA 46.3 ml/m2. The appendage was moderately dilated.  ------------------------------------------------------------ Atrial septum:  A septal defect  cannot be excluded. A patent foramen ovale cannot be excluded.  ------------------------------------------------------------ Right ventricle:  The cavity size was normal. Wall thickness was normal. Systolic function was normal.  ------------------------------------------------------------ Pulmonic valve:   Poorly visualized.  Doppler:   No significant regurgitation.  ------------------------------------------------------------ Tricuspid valve:   Structurally normal valve.   Leaflet separation was normal.  Doppler:  Transvalvular velocity was within the normal range.  Mild regurgitation.  ------------------------------------------------------------ Right atrium:  The atrium was mildly dilated.  ------------------------------------------------------------ Pericardium:  The pericardium was normal in appearance. There was no pericardial effusion.  ------------------------------------------------------------ Systemic veins: Inferior vena cava: The vessel was normal in size; the respirophasic diameter changes were in the normal range (= 50%); findings are consistent with normal central venous pressure.  ------------------------------------------------------------ Pleura:  There was a right pleural effusion.   ------------------------------------------------------------ Post procedure conclusions Ascending Aorta:  - The aorta was normal, not dilated, and non-diseased.  ------------------------------------------------------------  2D measurements        Normal  Doppler measurements   Normal Left ventricle                 Main pulmonary LVID ED,   45.5 mm     43-52   artery chord,                         Pressure,    31 mm Hg  =30 PLAX                           S LVID ES,   21.8 mm     23-38   Left ventricle chord,                         Ea, lat  3.9 cm/s   ------ PLAX                           ann, tiss FS, chord,   52 %      >29     DP PLAX                            E/Ea, lat  33.3        ------ LVPW, ED   15.4 mm     ------  ann, tiss     3 IVS/LVPW   1.14        <1.3    DP ratio, ED                      Ea, med     4.2 cm/s   ------ Ventricular septum             ann, tiss IVS, ED    17.6 mm     ------  DP LVOT                           E/Ea, med  30.9        ------ Diam, S      20 mm     ------  ann, tiss     5 Area       3.14 cm^2   ------  DP Aorta                          LVOT Root diam,   31 mm     ------  Peak vel,   151 cm/s   ------ ED                             S Left atrium                    Peak          9 mm Hg  ------ AP dim       45 mm     ------  gradient, AP dim     2.08 cm/m^2 <2.2    S index                          Aortic valve                                Peak vel,   446 cm/s   ------                                S                                Mean vel,   301 cm/s   ------                                S  VTI, S      115 cm     ------                                Mean         44 mm Hg  ------                                gradient,                                S                                Peak         80 mm Hg  ------                                gradient,                                S                                Area, VTI  0.88 cm^2   ------                                Area index 0.41 cm^2/m ------                                (VTI)           ^2                                Peak vel   0.34        ------                                ratio,                                LVOT/AV                                Area, Vmax 1.06 cm^2   ------                                Area index 0.49 cm^2/m ------                                (Vmax)          Karie Fetch  Regurg PHT  507 ms     ------                                Mitral valve                                Peak E vel  130 cm/s   ------                                Peak A vel   167 cm/s   ------                                Mean vel,   112 cm/s   ------                                D                                Decelerati  472 ms     150-23                                on time                0                                Pressure    159 ms     ------                                half-time                                Mean          6 mm Hg  ------                                gradient,                                D                                Peak         12 mm Hg  ------                                gradient,                                D  Peak E/A    0.8        ------                                ratio                                Area (PHT) 1.38 cm^2   ------                                Area index 0.64 cm^2/m ------                                (PHT)           ^2                                Area       1.79 cm^2   ------                                (LVOT)                                continuity                                Area index 0.83 cm^2/m ------                                (LVOT           ^2                                cont)                                Annulus    56.8 cm     ------                                VTI                                Tricuspid valve                                Regurg      257 cm/s   ------                                peak vel                                Peak RV-RA   26 mm  Hg  ------                                gradient,                                S                                Max regurg  257 cm/s   ------                                vel                                Systemic veins                                Estimated     5 mm Hg  ------                                CVP                                Right ventricle                                Pressure,    31 mm Hg  <30                                 S                                Sa vel,    12.1 cm/s   ------                                lat ann,                                tiss DP   ------------------------------------------------------------ Prepared and Electronically Authenticated by  Susa Griffins, MD [facility] 2014-02-10T12:17:57.870    Impression:  She has severe aortic stenosis and moderate mitral stenosis with a history of rheumatic heart disease. It is not possible to fully evaluate the valvular structure on her 2-D echocardiogram and I think TEE would give much better detail. She is symptomatic and limits her activity to minimize the symptoms. She is only 67 years old so I think she will need aortic and mitral valve replacements. I don't think this has to be done immediately but probably should be done within the next 6 months or so to prevent clinical worsening. She is scheduled to undergo repeat colonoscopy in the near future and would like to wait until that has been completed. She would like to think about it further and discuss it  with her family. She would require cardiac catheterization. She is concerned about having to lay on her back for any length of time due to her difficulty from previous back surgery. She would be interested in having a radial arterial catheterization if possible. I think a TEE would be helpful in preoperative planning since she does have a large amount of calcium particularly in the mitral valve and annulus. I reviewed the operative procedure with her including alternatives, benefits, and risks. I discussed the pros and cons of mechanical and tissue valves and my recommendation for tissue valves given her age of 30 with a history of rectal cancer.  Plan:  She will let myself and Dr. Alanda Amass know when she would like to proceed with surgery so that cardiac catheterization and TEE to be performed preoperatively.

## 2013-03-19 ENCOUNTER — Ambulatory Visit (AMBULATORY_SURGERY_CENTER): Payer: Medicare Other | Admitting: Gastroenterology

## 2013-03-19 ENCOUNTER — Encounter: Payer: Self-pay | Admitting: Gastroenterology

## 2013-03-19 VITALS — BP 124/79 | HR 68 | Temp 98.1°F | Resp 18

## 2013-03-19 DIAGNOSIS — Z1211 Encounter for screening for malignant neoplasm of colon: Secondary | ICD-10-CM

## 2013-03-19 DIAGNOSIS — R933 Abnormal findings on diagnostic imaging of other parts of digestive tract: Secondary | ICD-10-CM

## 2013-03-19 DIAGNOSIS — Z85048 Personal history of other malignant neoplasm of rectum, rectosigmoid junction, and anus: Secondary | ICD-10-CM

## 2013-03-19 DIAGNOSIS — R198 Other specified symptoms and signs involving the digestive system and abdomen: Secondary | ICD-10-CM

## 2013-03-19 DIAGNOSIS — D126 Benign neoplasm of colon, unspecified: Secondary | ICD-10-CM

## 2013-03-19 MED ORDER — SODIUM CHLORIDE 0.9 % IV SOLN: 500.0000 mL | INTRAVENOUS | Status: DC

## 2013-03-19 NOTE — Patient Instructions (Addendum)

## 2013-03-19 NOTE — Progress Notes (Signed)
Patient did not have preoperative order for IV antibiotic SSI prophylaxis. (G8918)  Patient did not experience any of the following events: a burn prior to discharge; a fall within the facility; wrong site/side/patient/procedure/implant event; or a hospital transfer or hospital admission upon discharge from the facility. (G8907)  

## 2013-03-19 NOTE — Op Note (Signed)
[facility] [address] [place], [address]   COLONOSCOPY PROCEDURE REPORT  PATIENT: [name], [name]  MR#: [id number] BIRTHDATE: [birth year] , 66  yrs. old GENDER: Female ENDOSCOPIST: [name], MD PROCEDURE DATE:  03/19/2013 PROCEDURE:   Colonoscopy with balloon dilation and Colonoscopy with snare polypectomy ASA CLASS:   Class III INDICATIONS:Rectal cancer, midrectum [id number], s/p neoadj chemoXRT, lap LAR with diverting loop ileostomy 08/2011, ileostomy takedown 03/2012. MEDICATIONS: Fentanyl 50 mcg IV and Versed 7 mg IV  DESCRIPTION OF PROCEDURE:   After the risks benefits and alternatives of the procedure were thoroughly explained, informed consent was obtained.  A digital rectal exam revealed no abnormalities of the rectum.   The LB [id number]  endoscope was introduced through the anus and advanced to the cecum, which was identified by both the appendix and ileocecal valve. No adverse events experienced.   The quality of the prep was good.  The instrument was then slowly withdrawn as the colon was fully examined.   COLON FINDINGS: One small pedunculated polyp was removed with snare from sigmoid colon and sent to pathology.  This was 3mm across. LAR resection anastomosis was evident about 10cm from anus and was more edematous than usual, causing luminal narrowing to 1.7cm.  I dilated this area with a 20mm CRE TTS ballon held inflated for 1 minute.  The examination was otherwise normal.  Retroflexed views revealed no abnormalities. The time to cecum=2 minutes 26 seconds. Withdrawal time=13 minutes 38 seconds.  The scope was withdrawn and the procedure completed. COMPLICATIONS: There were no complications.  ENDOSCOPIC IMPRESSION: One small pedunculated polyp was removed with snare from sigmoid colon and sent to pathology.  This was 3mm across.  LAR resection anastomosis was evident about 10cm from anus and was more edematous than usual,  causing luminal narrowing to 1.7cm.  I dilated this area with a 20mm CRE TTS ballon held inflated for 1 minute.  The examination was otherwise normal.  RECOMMENDATIONS: You will receive a letter within 1-2 weeks with the results of your biopsy as well as final recommendations.  Please call my office if you have not received a letter after 3 weeks. You will likely need repeat colonoscopy in 3 years. Please call my office to schedule return visit in 3-4 weeks to discuss your response to todays dilation.  eSigned:  [name], MD 03/19/2013 8:58 AM   cc: [name], MD; Wyonia Hough, MD; Mancel Bale, MD

## 2013-03-20 ENCOUNTER — Telehealth: Payer: Self-pay

## 2013-03-20 ENCOUNTER — Telehealth: Payer: Self-pay | Admitting: Cardiovascular Disease

## 2013-03-20 NOTE — Telephone Encounter (Signed)
Message forwarded to [facility]. Berlinda Last, LPN.  JC notified.

## 2013-03-20 NOTE — Telephone Encounter (Signed)
Saw Dr Linde Gillis thinks she needs surgery-wants to talk to J C!

## 2013-03-20 NOTE — Telephone Encounter (Signed)
  Follow up Call-  Call back number 03/19/2013 03/13/2012 05/10/2011  Post procedure Call Back phone  # [phone]  Permission to leave phone message Yes Yes -     Patient questions:  Do you have a fever, pain , or abdominal swelling? no Pain Score  0 *  Have you tolerated food without any problems? yes  Have you been able to return to your normal activities? yes  Do you have any questions about your discharge instructions: Diet   no Medications  no Follow up visit  no  Do you have questions or concerns about your Care? no  Actions: * If pain score is 4 or above: No action needed, pain <4.

## 2013-03-22 NOTE — Telephone Encounter (Signed)
Message forwarded to J.C. Wildman, LPN.  

## 2013-03-22 NOTE — Telephone Encounter (Signed)
Please have J C call her on Monday!

## 2013-03-26 ENCOUNTER — Encounter: Payer: Self-pay | Admitting: Gastroenterology

## 2013-03-26 NOTE — Telephone Encounter (Signed)
Spoke to pt. And she told me Dr. Laneta Simmers want to do valve surgery, but wanted her to have a LHC first. Pt. Requested to have cath done Radially due to back pain. I told her i would talk to Dr. Alanda Amass and let her know.

## 2013-03-28 NOTE — Telephone Encounter (Signed)
Pt. Informed dr. Alanda Amass wants to see her some time after the first week of July. Pt. Informed to call and make an appt. Some time tomarrow or next week. Pt. Stated understanding of these instructionss

## 2013-04-05 ENCOUNTER — Telehealth (INDEPENDENT_AMBULATORY_CARE_PROVIDER_SITE_OTHER): Payer: Self-pay

## 2013-04-05 NOTE — Telephone Encounter (Signed)
Called pt to let her know we received her path results from her colonoscopy and polyp was benign.  She had already heard from Dr. Larae Grooms office, but thanked me for calling.  I reminded her that she would be getting a call from our office in 3-4 months to return for her 6 month f/u with Dr. Donell Beers.

## 2013-04-08 ENCOUNTER — Encounter (HOSPITAL_COMMUNITY)
Admission: RE | Admit: 2013-04-08 | Discharge: 2013-04-08 | Disposition: A | Discharge status: Home / Self Care | Payer: Medicare Other | Source: Ambulatory Visit | Attending: Internal Medicine | Admitting: Internal Medicine

## 2013-04-10 ENCOUNTER — Encounter (HOSPITAL_COMMUNITY): Payer: Self-pay

## 2013-04-10 DIAGNOSIS — I369 Nonrheumatic tricuspid valve disorder, unspecified: Secondary | ICD-10-CM | POA: Insufficient documentation

## 2013-04-10 DIAGNOSIS — I080 Rheumatic disorders of both mitral and aortic valves: Secondary | ICD-10-CM | POA: Insufficient documentation

## 2013-04-12 ENCOUNTER — Encounter (HOSPITAL_COMMUNITY): Payer: Self-pay

## 2013-04-15 ENCOUNTER — Encounter (HOSPITAL_COMMUNITY): Payer: Self-pay

## 2013-04-17 ENCOUNTER — Encounter (HOSPITAL_COMMUNITY)
Admission: RE | Admit: 2013-04-17 | Discharge: 2013-04-17 | Disposition: A | Discharge status: Home / Self Care | Payer: Self-pay | Source: Ambulatory Visit | Attending: Internal Medicine | Admitting: Internal Medicine

## 2013-04-19 ENCOUNTER — Encounter (HOSPITAL_COMMUNITY)
Admission: RE | Admit: 2013-04-19 | Discharge: 2013-04-19 | Disposition: A | Discharge status: Home / Self Care | Payer: Self-pay | Source: Ambulatory Visit | Attending: Internal Medicine | Admitting: Internal Medicine

## 2013-04-22 ENCOUNTER — Encounter (HOSPITAL_COMMUNITY): Payer: Self-pay

## 2013-04-23 ENCOUNTER — Encounter: Payer: Self-pay | Admitting: Gastroenterology

## 2013-04-23 ENCOUNTER — Ambulatory Visit (INDEPENDENT_AMBULATORY_CARE_PROVIDER_SITE_OTHER): Payer: Medicare Other | Admitting: Gastroenterology

## 2013-04-23 VITALS — BP 148/70 | HR 68 | Ht 68.0 in | Wt 240.4 lb

## 2013-04-23 DIAGNOSIS — C20 Malignant neoplasm of rectum: Secondary | ICD-10-CM

## 2013-04-23 NOTE — Progress Notes (Signed)
Review of pertinent gastrointestinal problems: 1. Rectal cancer, midrectum ypT3ypN0, s/p neoadj chemoXRT, lap LAR with diverting loop ileostomy 08/2011, ileostomy takedown 03/2012.  Colonoscopy 03/2013 found 1 small TA (next recall 3 years), LAR anastomosis was a bit narrowed, edematous and dilated to 2cm with balloon.  HPI: This is a   very pleasant 67 year old woman whom I last saw the time of a colonoscopy. See those results summarized above.  Has feeling of incomplete evacuation.  HAs noticed no change at all with dilation. She experimented at home, started taking miralax several days in a row.  Felt like it helped but as she continued she had diarrhea.   Past Medical History  Diagnosis Date  . IBS (irritable bowel syndrome)   . Hyperlipidemia   . Allergic rhinitis   . History of pneumonia   . COPD (chronic obstructive pulmonary disease)   . Asthma   . Heart murmur     Aortic  . Lung nodule     RLL nodule-18mm stable 2006, April 2009, and June 2009  . Candidiasis of breast   . Anemia   . Hyperlipemia   . Obesity   . Unspecified essential hypertension   . Heart valve problem     PER CARDIOLOGIST 05-03-2011  . Sleep difficulties   . Hemorrhoids   . Cancer 04/2011    rectal  . Sleep apnea     PT TOLD BORDERLINE-TRIED CPAP-DID NOT HELP-SHE DOES NOT USE CPAP  . Arthritis   . 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 History of radiation therapy 05/30/11 to 07/07/11    rectum  . Prophylactic chemotherapy     Will finish chemo on 03/04/12  . Pneumonia     hx of several times   . Shingles     Past Surgical History  Procedure Laterality Date  . Foot surgery  left foot    hammer toe  . Hand surgery  Bil    carpal tunnel  . Back surgery      2006 SPACER---2007 LUMBAR FUSION  . Tonsillectomy    . Colon resection  08/25/2011    Procedure: COLON RESECTION LAPAROSCOPIC;  Surgeon: [name], MD;  Location: [facility];  Service: General;  Laterality: N/A;  Laparoscopic Assisted Low Anterior  Resection Diverting Ostomy and onQ pain pump  . Ileostomy  08/25/2011  . Colon surgery    . Ileostomy closure  04/02/2012    Procedure: ILEOSTOMY TAKEDOWN;  Surgeon: [name], MD;  Location: [facility];  Service: General;  Laterality: N/A;  . Bowel resection  04/02/2012    Procedure: SMALL BOWEL RESECTION;  Surgeon: [name], MD;  Location: [facility];  Service: General;  Laterality: N/A;    Current Outpatient Prescriptions  Medication Sig Dispense Refill  . ADVAIR DISKUS 100-50 MCG/DOSE AEPB inhale 1 dose INTO THE LUNGS twice a day  60 each  9  . aspirin 81 MG tablet Take 81 mg by mouth at bedtime.       . Calcium Carbonate-Vitamin D (CALCIUM 600 + D PO) Take 1,200 mg by mouth at bedtime.       Marland Kitchen diltiazem (CARDIZEM CD) 120 MG 24 hr capsule Take 120 mg by mouth at bedtime.       . diphenoxylate-atropine (LOMOTIL) 2.5-0.025 MG per tablet 1 tablet as needed.       Marland Kitchen losartan (COZAAR) 50 MG tablet 50 mg at bedtime.       . lovastatin (MEVACOR) 40 MG tablet take 1 tablet by mouth once daily  30 tablet  11  . montelukast (SINGULAIR) 10 MG tablet take 1 tablet by mouth at bedtime  30 tablet  9  . pantoprazole (PROTONIX) 40 MG tablet Take 40 mg by mouth at bedtime.       Marland Kitchen SPIRIVA HANDIHALER 18 MCG inhalation capsule place 1 capsule INTO INHALER and inhale daily  30 capsule  9  . albuterol (VENTOLIN HFA) 108 (90 BASE) MCG/ACT inhaler Inhale 2 puffs into the lungs every 6 (six) hours as needed for wheezing.  1 Inhaler  1   No current facility-administered medications for this visit.    Allergies as of 04/23/2013 - Review Complete 04/23/2013  Allergen Reaction Noted  . Codeine Nausea Only   . Prednisone Hives   . Sulfonamide derivatives Nausea Only     Family History  Problem Relation Age of Onset  . Lung cancer Father   . Cancer Father     lung  . Diabetes    . Heart disease    . Heart disease Mother   . Diabetes Mother   . Hypertension Mother   . Colon cancer Neg Hx   . Esophageal  cancer Neg Hx   . Stomach cancer Neg Hx   . Rectal cancer Neg Hx     History   Social History  . Marital Status: Married    Spouse Name: N/A    Number of Children: N/A  . Years of Education: N/A   Occupational History  . retired    Social History Main Topics  . Smoking status: Former Smoker -- 1.50 packs/day for 10 years    Types: Cigarettes    Quit date: 12/27/1974  . Smokeless tobacco: Never Used  . Alcohol Use: No     Comment: rare  . Drug Use: No  . Sexually Active: No   Other Topics Concern  . Not on file   Social History Narrative   Married 1966   2 sons; 1 grandchild   Retired-[employer]   Doing ok   March '09-financial tough times      Physical Exam: BP 148/70  Pulse 68  Ht 5\' 8"  (1.727 m)  Wt 240 lb 6.4 oz (109.045 kg)  BMI 36.56 kg/m2 Constitutional: generally well-appearing Psychiatric: alert and oriented x3 Abdomen: soft, nontender, nondistended, no obvious ascites, no peritoneal signs, normal bowel sounds     Assessment and plan: 67 y.o. female with  abnormal bowels, edema at her low anterior resection anastomosis  I used the largest balloon dilator but we have to try to stretch this area open. It was only mildly narrowed and she did not receive any improvement from the dilation. We will go a different direction therefore. She has tried MiraLax with partial response. She will continue this at one half dose per day. If this fails she will then tried fiber supplements daily.

## 2013-04-23 NOTE — Patient Instructions (Addendum)
Continue with miralax at 1/2 dose per day.  If this works, stay on it. If miralax proves unhelpful, then pease start taking citrucel (orange flavored) powder fiber supplement.  This may cause some bloating at first but that usually goes away. Begin with a small spoonful and work your way up to a large, heaping spoonful daily over a week. If these both fail, call Dr. Christella Hartigan.

## 2013-04-24 ENCOUNTER — Encounter (HOSPITAL_COMMUNITY)
Admission: RE | Admit: 2013-04-24 | Discharge: 2013-04-24 | Disposition: A | Discharge status: Home / Self Care | Payer: Self-pay | Source: Ambulatory Visit | Attending: Internal Medicine | Admitting: Internal Medicine

## 2013-04-26 ENCOUNTER — Encounter (HOSPITAL_COMMUNITY)
Admission: RE | Admit: 2013-04-26 | Discharge: 2013-04-26 | Disposition: A | Discharge status: Home / Self Care | Payer: Self-pay | Source: Ambulatory Visit | Attending: Internal Medicine | Admitting: Internal Medicine

## 2013-04-29 ENCOUNTER — Encounter (HOSPITAL_COMMUNITY): Payer: Self-pay

## 2013-05-01 ENCOUNTER — Encounter (HOSPITAL_COMMUNITY)
Admission: RE | Admit: 2013-05-01 | Discharge: 2013-05-01 | Disposition: A | Discharge status: Home / Self Care | Payer: Self-pay | Source: Ambulatory Visit | Attending: Internal Medicine | Admitting: Internal Medicine

## 2013-05-03 ENCOUNTER — Ambulatory Visit (INDEPENDENT_AMBULATORY_CARE_PROVIDER_SITE_OTHER): Payer: Medicare Other | Admitting: Internal Medicine

## 2013-05-03 ENCOUNTER — Encounter: Payer: Self-pay | Admitting: Internal Medicine

## 2013-05-03 ENCOUNTER — Encounter (HOSPITAL_COMMUNITY): Payer: Self-pay

## 2013-05-03 VITALS — BP 122/72 | HR 78 | Temp 98.0°F | Ht 68.0 in | Wt 236.6 lb

## 2013-05-03 DIAGNOSIS — J449 Chronic obstructive pulmonary disease, unspecified: Secondary | ICD-10-CM

## 2013-05-03 NOTE — Patient Instructions (Addendum)
#COPD  - some wheezing depsite subjective baseline state - continue spiriva  - chagne ADVAIR to BREO 1 puff daily - flu shot in fall  - continue rehab  #Weight mgmt #WEight Management    - we discussed extensively about weight management   - follow low glycemic diet plan that I outlined for you after extensive discussion. Do not follow other plans  - General  - drink lot of water  - avoid all moderate and high glycemic foods especially bad fruits, breads, pastas, fried foods, battered foods, sugary foods (these are the food in centerl and right lane that you have to avoid)  - make non-starchy vegetables your base in terms of volume you eat; 50% of what you see on the plate and goes into your mouth should be these vegetables (the vegetables in the left lane)  - always make sure you balance good carbs, good protein and good fat source  - good carbs are non-starchy vegetables, uncanned beans in the left column and low glycemic fruits in the left colum  - good protein source is egg white, beans, tofu, fish, chicken breast, fish, [place] and bison. Remember meat has to be skinless  - good healthy fat source is nuts, and fish   - focusing on eating right healthy foods (left lane) and avoiding unhealthy foods (middle and right lane) is better way to lose weight than to go hypo-caloric  - focus on staying full by eating right  - having a daily and weekly plan for what you will eat and where you will eat depending on your work, social life schedule is very important   - watch out for misleading labels on grocery aisle: High Fiber and Low Fat labeled foods generally are high in bad carbs or sugar  - measure weight once  a week  - discipline and attitude is key. Do not care for anyone else's opinion or feelings. Only yours matters   - For breakfast  - most important meal of the day. So, eat daily breakfast. Do not skip   - recommend 1/2 to 1 cup steel cut oat meal or 1/2 to 1 cup fiber one 60 cal    Or  1 to 1.5 cups Kashi go-lean with non-fat plain milk or 60 Cal Silk Soy mild. Can add Berries. Can have egg at same time for breakfast  - For snacks  - recommend total 2-3 snacks per day  - snack should be light and filling  - best times are between breakfast and lunch, lunch and dinner and sometimes post-dinner snack  - Nut are great snacks. Stick to low glycemic nuts (less than 50gm per day) and eat only the nuts in the left lane like peanuts, pista, almond, walnut  - Low glycemic fruits are great snacks. Have 1-2 servings each day of fruits from the left lane   - If you like yogurt or cottage cheese - recommend Oikos or Fage 0% greek yogourt or Plan non-fat yogurt or [place] non-fat cottage cheese. Theyse have the least sugar. Do no exceed 100-200 gram per day. Fruit yogurts are the worst  - Good Protein Shakes are good snacks:  EAS Abbot Whey Powder shake, EAS Myoplex lite, EAS Carb Control. Muscle Milk Shake  - For Lunch and dinner  - unlimited non-starchy vegetable (prefer raw fresh or roasted or grilled) with skinless chicken or fish  - Special Notes  - Nuts: Nut are great snacks and have heart benefits. Stick to low glycemic  nuts (less than 50gm per day) and eat only the nuts in the left lane like peanuts, pista, almond, walnuts  -  Ok to eat above nuts daily but only < 50gm/day  - If you eat more than 50gm/day then you run risk of eating too many calories or saturated fat  - AVoid nuts glazed with sugar. Nuts have to be in salted/original form or roasted   - Fruits: Eat 1-2 fresh fruit servings daily but fruits can be dangerous because of high sugar content. So, choose your fruits wisely. Eat only the low glycemic fruits (left lane). Eat them fresh.  Do not eat them canned  - Avoid all fresh juices except if you use the low glycemic fruits and make them yourself without adding extra sugar   - Dairy: Is optional. Eat zero fat or low fat, fruit free yogurts or cottage cheese but  not more than 100-200g per day  - Restaurant  - all restaurants have bad and good choices. Even fast food restaurants offer you good choices  - at restaurants do no fall prey to social pressure.. One way to eat healthy at restaurant is to eat healthy snack or light healthy meal before you go to restaurant so that will prevent your cravings  - Restaurants with worst choices: [place] (except [employer]ipot[employer], or [employer]), [place], [place]. At these restaurants avoid the bread, curry, fried and battered foods and chips  - Restaurants with best choices: greek, [place], [place], [place] (again here avoid bread, deep fried stuffed and pasta)  - Restaurants with Ok choice: McDonald's, [employer], [employer] (again here avoid the bread, fried stuff, fried meat)  - Always ask for grilled meat or vegetables, and fresh salad choices (get your salad dressing as low fat and to the side)  - For Will Power  - Prepare, prepare, prepare: Plan your day and think of what you will eat and when you will eat and where you will eat.  - Snack good stuff to keep yourself full to avoid hunger and losing will power  - 1 minute fast walk when feeling cravings could  Help  -  Eat breakfast  - Berries are excellent to control sugar cravings  - Proteins can prevent cravings  - Fats like nuts keep you full and prevent cravings  - Avoid hunger; stay ahead by eating small amounts of the right food  - Avoid hunger; stay ahead by eating frequently  - Drink lot of water  - Eat slower    #FOllowup 2 months for copd and weight managment

## 2013-05-03 NOTE — Progress Notes (Signed)
Subjective:    Patient ID: [name], female    DOB: 13-Jan-1946, 67 y.o.   MRN: [id number]  HPI reports that she quit smoking about 37 years ago. Her smoking use included Cigarettes. She has a 15 pack-year smoking history. She has never used smokeless tobacco.  #Obese  Body mass index is 34.85 kg/(m^2). Body mass index is 35.98 kg/(m^2). - 05/03/2013    #Ex 15 pack smoker. Quit 1970s  #COPD/ASthma - Baseline CAT score 18 in May 2013 (poor score is 4 sleep quality, level 3 for dyspnea/fatigue)  #Pulmonary nodules  -  Colon Cancer during cancer Rx had CT chest Oct 2012 and pulmonary nodules noted  - October 2012: There is an irregular nodule in the right upper lobe measuring 12 mm which is unchanged from prior and has adjacent bronchiectasis also unchanged. Within the left lower lobe, there is a 4 mm pulmonary nodule (image 30) which is unchanged.  - October 2013: No change on CT chest Oct 2013   #colon Cancer  - 2012 (early) - s/p surgery and chemo  #Arctic valve stenosis  0 Dr. Alanda Amass  #Exacerbations of obstructive lung disease   - November 2013: Treatment from office with Levaquin and Medrol Dosepak  - March 2-14: Prednisone opd for flare up     OV .01/01/2013  Follows up for COPD/asthma.  - She is reporting chronic worsening of dyspnea for the last several months but the worsening is mild overall that she feels stable. In fact on COPD cat score is only 9 and is much better than baseline of 18 and also much improved compared to 38 at last visit in November 2013 when she was in an exacerbation. She says that her aortic  valve stenosis is stable and is being monitored by [facility] cardiology Dr. Alanda Amass. She feels deconditioned and is wondering if she needs to be attend pulmonary rehabilitation maintenance program. She denies any overt wheezing or worsening cough or sputum but on exam she was actively wheezing this visit   REC  #COPD  - You have mild wheezing  even though you feel well  Take prednisone 40 mg daily x 2 days, then 20mg  daily x 2 days, then 10mg  daily x 2 days, then 5mg  daily x 2 days and stop  I will re\re refer you to pulmonary rehabilitation, you can start that for maintenance program  Start N. acetylcysteine 600 mg twice a day  - [place] study published in Lancet British Journal in 2014 shows it helps  - this is not to make you feel better but to to prevent recurrent bronchitis episodes  - You are more likely to get this drug at [facility] or a vitamin store then at [employer] or [facility] or [employer]  - You can also get this at website ConstitutionJournal.co.uk  - It functions as an anti-oxidant and there are minimal/none side effects  #Followup  4-5 months with COPD cat score at followup   OV 05/03/2013  FU for copd/asthma  - Has been told that AS is severe and needs AVR. SO she has joined rehab. Has seen dr Laneta Simmers. COPD itrself is stable. No AECOPD. Continues with mdi. Feels good. Last time she was asymtoatic and we gave pred becuaes of wheeeon exam. THis timme too though baseline has occ wheeze despite spiriva and advair.  COPD CAT score is 19 but says this is stable.   HEr obesity continues to be a problem; says she is trying not to eat and joining rehab  but unable to lose weight. I counseled her about diet and she rolled her eyes when I suggested list of foods to eat and list of foods to avoid. She acknowledged that it would be difficult for her to make a change. She thinks that weight loss involves eating less. She does not realize that weight loss involves eating different which he admits is a harder challenge    CAT COPD Symptom & Quality of Life Score (GSK trademark) 0 is no burden. 5 is highest burden 09/04/2012 Exacerbation  01/01/2013  05/03/2013   Never Cough -> Cough all the time 4 2 2   No phlegm in chest -> Chest is full of phlegm 4 0.5 1  No chest tightness -> Chest feels very tight 5 0.5 3  No dyspnea for 1 flight stairs/hill ->  Very dyspneic for 1 flight of stairs 5 3.5 4  No limitations for ADL at home -> Very limited with ADL at home 5 1.5 2  Confident leaving home -> Not at all confident leaving home 5 0 1  Sleep soundly -> Do not sleep soundly because of lung condition 5 0 4  Lots of Energy -> No energy at all 5 1 2.5  TOTAL Score (max 40)  38 9 19.5    Past, Family, Social reviewed: Has seen Dr Laneta Simmers for aortic stenosis and has been recommended valve replacement    Review of Systems  Constitutional: Negative for fever and unexpected weight change.  HENT: Negative for ear pain, nosebleeds, congestion, sore throat, rhinorrhea, sneezing, trouble swallowing, dental problem, postnasal drip and sinus pressure.   Eyes: Negative for redness and itching.  Respiratory: Negative for cough, chest tightness, shortness of breath and wheezing.   Cardiovascular: Negative for palpitations and leg swelling.  Gastrointestinal: Negative for nausea and vomiting.  Genitourinary: Negative for dysuria.  Musculoskeletal: Negative for joint swelling.  Skin: Negative for rash.  Neurological: Negative for headaches.  Hematological: Does not bruise/bleed easily.  Psychiatric/Behavioral: Negative for dysphoric mood. The patient is not nervous/anxious.    Current outpatient prescriptions:Acetylcysteine (NAC 600) 600 MG CAPS, Take 1 capsule by mouth daily., Disp: , Rfl: ;  ADVAIR DISKUS 100-50 MCG/DOSE AEPB, inhale 1 dose INTO THE LUNGS twice a day, Disp: 60 each, Rfl: 9;  albuterol (VENTOLIN HFA) 108 (90 BASE) MCG/ACT inhaler, Inhale 2 puffs into the lungs every 6 (six) hours as needed for wheezing., Disp: 1 Inhaler, Rfl: 1;  aspirin 81 MG tablet, Take 81 mg by mouth at bedtime. , Disp: , Rfl:  Calcium Carbonate-Vitamin D (CALCIUM 600 + D PO), Take 1,200 mg by mouth at bedtime. , Disp: , Rfl: ;  diltiazem (CARDIZEM CD) 120 MG 24 hr capsule, Take 120 mg by mouth at bedtime. , Disp: , Rfl: ;  diphenoxylate-atropine (LOMOTIL) 2.5-0.025 MG  per tablet, 1 tablet as needed. , Disp: , Rfl: ;  losartan (COZAAR) 50 MG tablet, 50 mg at bedtime. , Disp: , Rfl:  lovastatin (MEVACOR) 40 MG tablet, take 1 tablet by mouth once daily, Disp: 30 tablet, Rfl: 11;  montelukast (SINGULAIR) 10 MG tablet, take 1 tablet by mouth at bedtime, Disp: 30 tablet, Rfl: 9;  pantoprazole (PROTONIX) 40 MG tablet, Take 40 mg by mouth at bedtime. , Disp: , Rfl: ;  SPIRIVA HANDIHALER 18 MCG inhalation capsule, place 1 capsule INTO INHALER and inhale daily, Disp: 30 capsule, Rfl: 9     Objective:   Physical Exam Filed Vitals:   05/03/13 1332  BP: 122/72  Pulse:  78  Temp: 98 F (36.7 C)  TempSrc: Oral  Height: 5\' 8"  (1.727 m)  Weight: 236 lb 9.6 oz (107.321 kg)  SpO2: 93%                                 Constitutional: She is oriented to person, place, and time. She appears well-developed and well-nourished. No distress  HENT:  Head: Normocephalic and atraumatic.  Right Ear: External ear normal.  Left Ear: External ear normal.  Mouth/Throat: Oropharynx is clear and moist. No oropharyngeal exudate.  Eyes: Conjunctivae and EOM are normal. Pupils are equal, round, and reactive to light. Right eye exhibits no discharge. Left eye exhibits no discharge. No scleral icterus.  Neck: Normal range of motion. Neck supple. No JVD present. No tracheal deviation present. No thyromegaly present.  Cardiovascular: Normal rate, regular rhythm and intact distal pulses.  Exam reveals no gallop and no friction rub.   Murmur heard.      Ejection systolic murmur + Pulmonary/Chest: Effort normal. No respiratory distress.  She exhibits no tenderness.       No distress t Abdominal: Soft. Bowel sounds are normal. She exhibits no distension and no mass. There is no tenderness. There is no rebound and no guarding.  Musculoskeletal: Normal range of motion. She exhibits no edema and no tenderness.  Lymphadenopathy:    She has no cervical adenopathy.  Neurological: She is  alert and oriented to person, place, and time. She has normal reflexes. No cranial nerve deficit. She exhibits normal muscle tone. Coordination normal.  Skin: Skin is warm and dry. No rash noted. She is not diaphoretic. No erythema. No pallor.  Psychiatric: She has a normal mood and affect. Her behavior is normal. Judgment and thought content normal.             Assessment & Plan:

## 2013-05-06 ENCOUNTER — Encounter (HOSPITAL_COMMUNITY): Payer: Self-pay

## 2013-05-08 ENCOUNTER — Encounter (HOSPITAL_COMMUNITY)
Admission: RE | Admit: 2013-05-08 | Discharge: 2013-05-08 | Disposition: A | Discharge status: Home / Self Care | Payer: Self-pay | Source: Ambulatory Visit | Attending: Internal Medicine | Admitting: Internal Medicine

## 2013-05-09 NOTE — Assessment & Plan Note (Signed)
#Weight mgmt #WEight Management    - we discussed extensively about weight management   - follow low glycemic diet plan that I outlined for you after extensive discussion. Do not follow other plans  - General  - drink lot of water  - avoid all moderate and high glycemic foods especially bad fruits, breads, pastas, fried foods, battered foods, sugary foods (these are the food in centerl and right lane that you have to avoid)  - make non-starchy vegetables your base in terms of volume you eat; 50% of what you see on the plate and goes into your mouth should be these vegetables (the vegetables in the left lane)  - always make sure you balance good carbs, good protein and good fat source  - good carbs are non-starchy vegetables, uncanned beans in the left column and low glycemic fruits in the left colum  - good protein source is egg white, beans, tofu, fish, chicken breast, fish, [place] and bison. Remember meat has to be skinless  - good healthy fat source is nuts, and fish   - focusing on eating right healthy foods (left lane) and avoiding unhealthy foods (middle and right lane) is better way to lose weight than to go hypo-caloric  - focus on staying full by eating right  - having a daily and weekly plan for what you will eat and where you will eat depending on your work, social life schedule is very important   - watch out for misleading labels on grocery aisle: High Fiber and Low Fat labeled foods generally are high in bad carbs or sugar  - measure weight once  a week  - discipline and attitude is key. Do not care for anyone else's opinion or feelings. Only yours matters   - For breakfast  - most important meal of the day. So, eat daily breakfast. Do not skip   - recommend 1/2 to 1 cup steel cut oat meal or 1/2 to 1 cup fiber one 60 cal   Or  1 to 1.5 cups Kashi go-lean with non-fat plain milk or 60 Cal Silk Soy mild. Can add Berries. Can have egg at same time for breakfast  - For  snacks  - recommend total 2-3 snacks per day  - snack should be light and filling  - best times are between breakfast and lunch, lunch and dinner and sometimes post-dinner snack  - Nut are great snacks. Stick to low glycemic nuts (less than 50gm per day) and eat only the nuts in the left lane like peanuts, pista, almond, walnut  - Low glycemic fruits are great snacks. Have 1-2 servings each day of fruits from the left lane   - If you like yogurt or cottage cheese - recommend Oikos or Fage 0% greek yogourt or Plan non-fat yogurt or Breakstone non-fat cottage cheese. Theyse have the least sugar. Do no exceed 100-200 gram per day. Fruit yogurts are the worst  - Good Protein Shakes are good snacks:  EAS Abbot Whey Powder shake, EAS Myoplex lite, EAS Carb Control. Muscle Milk Shake  - For Lunch and dinner  - unlimited non-starchy vegetable (prefer raw fresh or roasted or grilled) with skinless chicken or fish  - Special Notes  - Nuts: Nut are great snacks and have heart benefits. Stick to low glycemic nuts (less than 50gm per day) and eat only the nuts in the left lane like peanuts, pista, almond, walnuts  -  Ok to eat above nuts daily but  only < 50gm/day  - If you eat more than 50gm/day then you run risk of eating too many calories or saturated fat  - AVoid nuts glazed with sugar. Nuts have to be in salted/original form or roasted   - Fruits: Eat 1-2 fresh fruit servings daily but fruits can be dangerous because of high sugar content. So, choose your fruits wisely. Eat only the low glycemic fruits (left lane). Eat them fresh.  Do not eat them canned  - Avoid all fresh juices except if you use the low glycemic fruits and make them yourself without adding extra sugar   - Dairy: Is optional. Eat zero fat or low fat, fruit free yogurts or cottage cheese but not more than 100-200g per day  - Restaurant  - all restaurants have bad and good choices. Even fast food restaurants offer you good choices  -  at restaurants do no fall prey to social pressure.. One way to eat healthy at restaurant is to eat healthy snack or light healthy meal before you go to restaurant so that will prevent your cravings  - Restaurants with worst choices: [place] (except [employer]ipot[employer], or [place]), [place], [place]. At these restaurants avoid the bread, curry, fried and battered foods and chips  - Restaurants with best choices: greek, [place], [place], [place] (again here avoid bread, deep fried stuffed and pasta)  - Restaurants with Ok choice: McDonald's, [employer], [employer] (again here avoid the bread, fried stuff, fried meat)  - Always ask for grilled meat or vegetables, and fresh salad choices (get your salad dressing as low fat and to the side)  - For Will Power  - Prepare, prepare, prepare: Plan your day and think of what you will eat and when you will eat and where you will eat.  - Snack good stuff to keep yourself full to avoid hunger and losing will power  - 1 minute fast walk when feeling cravings could  Help  -  Eat breakfast  - Berries are excellent to control sugar cravings  - Proteins can prevent cravings  - Fats like nuts keep you full and prevent cravings  - Avoid hunger; stay ahead by eating small amounts of the right food  - Avoid hunger; stay ahead by eating frequently  - Drink lot of water  - Eat slower    #FOllowup 2 months for copd and weight managment

## 2013-05-09 NOTE — Assessment & Plan Note (Signed)
#  COPD  - some wheezing depsite subjective baseline state - continue spiriva  - chagne ADVAIR to BREO 1 puff daily - flu shot in fall  - continue rehab

## 2013-05-10 ENCOUNTER — Encounter (HOSPITAL_COMMUNITY): Payer: Medicare Other

## 2013-05-13 ENCOUNTER — Encounter (HOSPITAL_COMMUNITY): Payer: Medicare Other

## 2013-05-14 ENCOUNTER — Encounter (HOSPITAL_COMMUNITY)
Admission: RE | Admit: 2013-05-14 | Discharge: 2013-05-14 | Disposition: A | Discharge status: Home / Self Care | Payer: Self-pay | Source: Ambulatory Visit | Attending: Internal Medicine | Admitting: Internal Medicine

## 2013-05-14 DIAGNOSIS — I080 Rheumatic disorders of both mitral and aortic valves: Secondary | ICD-10-CM | POA: Insufficient documentation

## 2013-05-14 DIAGNOSIS — I369 Nonrheumatic tricuspid valve disorder, unspecified: Secondary | ICD-10-CM | POA: Insufficient documentation

## 2013-05-15 ENCOUNTER — Encounter (HOSPITAL_COMMUNITY): Payer: Medicare Other

## 2013-05-15 ENCOUNTER — Other Ambulatory Visit: Payer: Self-pay

## 2013-05-16 ENCOUNTER — Encounter (HOSPITAL_COMMUNITY)
Admission: RE | Admit: 2013-05-16 | Discharge: 2013-05-16 | Disposition: A | Discharge status: Home / Self Care | Payer: Self-pay | Source: Ambulatory Visit | Attending: Internal Medicine | Admitting: Internal Medicine

## 2013-05-17 ENCOUNTER — Encounter (HOSPITAL_COMMUNITY): Payer: Medicare Other

## 2013-05-20 ENCOUNTER — Encounter (HOSPITAL_COMMUNITY): Payer: Medicare Other

## 2013-05-21 ENCOUNTER — Encounter (HOSPITAL_COMMUNITY)
Admission: RE | Admit: 2013-05-21 | Discharge: 2013-05-21 | Disposition: A | Discharge status: Home / Self Care | Payer: Self-pay | Source: Ambulatory Visit | Attending: Internal Medicine | Admitting: Internal Medicine

## 2013-05-22 ENCOUNTER — Encounter (HOSPITAL_COMMUNITY): Payer: Medicare Other

## 2013-05-23 ENCOUNTER — Encounter (HOSPITAL_COMMUNITY)
Admission: RE | Admit: 2013-05-23 | Discharge: 2013-05-23 | Disposition: A | Discharge status: Home / Self Care | Payer: Self-pay | Source: Ambulatory Visit | Attending: Internal Medicine | Admitting: Internal Medicine

## 2013-05-24 ENCOUNTER — Encounter (HOSPITAL_COMMUNITY): Payer: Medicare Other

## 2013-05-27 ENCOUNTER — Encounter (HOSPITAL_COMMUNITY): Payer: Medicare Other

## 2013-05-27 ENCOUNTER — Telehealth: Payer: Self-pay

## 2013-05-27 NOTE — Telephone Encounter (Signed)
lvm for pt regarding to lab appt and moved est appt...mailed pt appt sched and avs with letter.

## 2013-05-28 ENCOUNTER — Encounter (HOSPITAL_COMMUNITY)
Admission: RE | Admit: 2013-05-28 | Discharge: 2013-05-28 | Disposition: A | Discharge status: Home / Self Care | Payer: Self-pay | Source: Ambulatory Visit | Attending: Internal Medicine | Admitting: Internal Medicine

## 2013-05-29 ENCOUNTER — Encounter (HOSPITAL_COMMUNITY): Payer: Medicare Other

## 2013-05-30 ENCOUNTER — Encounter (HOSPITAL_COMMUNITY)
Admission: RE | Admit: 2013-05-30 | Discharge: 2013-05-30 | Disposition: A | Discharge status: Home / Self Care | Payer: Self-pay | Source: Ambulatory Visit | Attending: Internal Medicine | Admitting: Internal Medicine

## 2013-05-31 ENCOUNTER — Encounter (HOSPITAL_COMMUNITY): Payer: Medicare Other

## 2013-06-03 ENCOUNTER — Encounter (HOSPITAL_COMMUNITY): Payer: Medicare Other

## 2013-06-04 ENCOUNTER — Encounter (HOSPITAL_COMMUNITY): Payer: Self-pay

## 2013-06-05 ENCOUNTER — Other Ambulatory Visit (HOSPITAL_BASED_OUTPATIENT_CLINIC_OR_DEPARTMENT_OTHER): Payer: Medicare Other | Admitting: Lab

## 2013-06-05 ENCOUNTER — Encounter (HOSPITAL_COMMUNITY): Payer: Medicare Other

## 2013-06-05 DIAGNOSIS — C20 Malignant neoplasm of rectum: Secondary | ICD-10-CM

## 2013-06-05 LAB — CEA: CEA: <0.5 ng/mL (ref 0.0–5.0)

## 2013-06-06 ENCOUNTER — Encounter (HOSPITAL_COMMUNITY)
Admission: RE | Admit: 2013-06-06 | Discharge: 2013-06-06 | Disposition: A | Discharge status: Home / Self Care | Payer: Self-pay | Source: Ambulatory Visit | Attending: Internal Medicine | Admitting: Internal Medicine

## 2013-06-07 ENCOUNTER — Encounter (HOSPITAL_COMMUNITY): Payer: Medicare Other

## 2013-06-07 ENCOUNTER — Ambulatory Visit: Payer: Medicare Other | Admitting: Oncology

## 2013-06-10 ENCOUNTER — Encounter (HOSPITAL_COMMUNITY): Payer: Self-pay

## 2013-06-11 ENCOUNTER — Encounter (HOSPITAL_COMMUNITY)
Admission: RE | Admit: 2013-06-11 | Discharge: 2013-06-11 | Disposition: A | Discharge status: Home / Self Care | Payer: Self-pay | Source: Ambulatory Visit | Attending: Internal Medicine | Admitting: Internal Medicine

## 2013-06-11 DIAGNOSIS — I080 Rheumatic disorders of both mitral and aortic valves: Secondary | ICD-10-CM | POA: Insufficient documentation

## 2013-06-11 DIAGNOSIS — I369 Nonrheumatic tricuspid valve disorder, unspecified: Secondary | ICD-10-CM | POA: Insufficient documentation

## 2013-06-12 ENCOUNTER — Encounter (HOSPITAL_COMMUNITY): Payer: Self-pay

## 2013-06-13 ENCOUNTER — Encounter (HOSPITAL_COMMUNITY)
Admission: RE | Admit: 2013-06-13 | Discharge: 2013-06-13 | Disposition: A | Discharge status: Home / Self Care | Payer: Self-pay | Source: Ambulatory Visit | Attending: Internal Medicine | Admitting: Internal Medicine

## 2013-06-14 ENCOUNTER — Encounter (HOSPITAL_COMMUNITY): Payer: Self-pay

## 2013-06-17 ENCOUNTER — Encounter (HOSPITAL_COMMUNITY): Payer: Self-pay

## 2013-06-18 ENCOUNTER — Encounter (HOSPITAL_COMMUNITY): Payer: Self-pay

## 2013-06-19 ENCOUNTER — Encounter (HOSPITAL_COMMUNITY): Payer: Self-pay

## 2013-06-20 ENCOUNTER — Ambulatory Visit (HOSPITAL_BASED_OUTPATIENT_CLINIC_OR_DEPARTMENT_OTHER): Payer: Medicare Other | Admitting: Oncology

## 2013-06-20 ENCOUNTER — Encounter (HOSPITAL_COMMUNITY): Payer: Self-pay

## 2013-06-20 ENCOUNTER — Telehealth: Payer: Self-pay | Admitting: Oncology

## 2013-06-20 VITALS — BP 135/59 | HR 65 | Temp 97.3°F | Resp 18 | Ht 68.0 in | Wt 235.7 lb

## 2013-06-20 DIAGNOSIS — C20 Malignant neoplasm of rectum: Secondary | ICD-10-CM

## 2013-06-20 NOTE — Progress Notes (Signed)
   [facility]    OFFICE PROGRESS NOTE   INTERVAL HISTORY:   She returns as scheduled. She feels well. Good appetite and energy level. She takes MiraLAX for constipation. She underwent a colonoscopy by Dr. Christella Hartigan on 03/19/2013. A polyp was removed from the sigmoid colon. The rectal anastomosis was dilated. The polyp returned as a tubular adenoma. She is undergoing evaluation for valvular heart disease by doctors Alanda Amass and [place].  Objective:  Vital signs in last 24 hours:  Blood pressure 135/59, pulse 65, temperature 97.3 F (36.3 C), temperature source Oral, resp. rate 18, height 5\' 8"  (1.727 m), weight 235 lb 11.2 oz (106.913 kg).    HEENT: Neck without mass Lymphatics: No cervical, supra-clavicular, axillary, or inguinal nodes Resp: Inspiratory rhonchi at the posterior base bilaterally, no respiratory distress Cardio: Regular rate and rhythm, 2/6 systolic murmur heard at the sternum and in the left axilla GI: No hepatomegaly, nontender, no mass Vascular: No leg edema  Skin: Yeast rash in the groin and abdominal pannus    Lab Results:  06/05/2013-CEA  less than 0.5  Medications: I have reviewed the patient's current medications.  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 Ileostomy takedown 04/02/2012  3. COPD 4. yeast rash-I recommended she see Dr Debby Bud    Disposition:  She remains in clinical remission from rectal cancer. She will be scheduled for a restaging CT  evaluation in October of 2014. [name] will return for an office visit and CEA in 6 months.   Thornton Papas, MD  06/20/2013  4:23 PM

## 2013-06-20 NOTE — Telephone Encounter (Signed)
[facility] PT APPT SCHEDULE FOR October 2014 AND MARCH 2015. [facility] WILL CALL RE CT FOR 10/14. PT AWARE.

## 2013-06-21 ENCOUNTER — Encounter: Payer: Self-pay | Admitting: Internal Medicine

## 2013-06-21 ENCOUNTER — Encounter (HOSPITAL_COMMUNITY): Payer: Self-pay

## 2013-06-23 ENCOUNTER — Other Ambulatory Visit: Payer: Self-pay | Admitting: Internal Medicine

## 2013-06-23 MED ORDER — FLUCONAZOLE 100 MG PO TABS: 100.0000 mg | ORAL_TABLET | Freq: Every day | ORAL | Status: DC

## 2013-06-24 ENCOUNTER — Encounter (HOSPITAL_COMMUNITY): Payer: Self-pay

## 2013-06-25 ENCOUNTER — Encounter (HOSPITAL_COMMUNITY)
Admission: RE | Admit: 2013-06-25 | Discharge: 2013-06-25 | Disposition: A | Discharge status: Home / Self Care | Payer: Self-pay | Source: Ambulatory Visit | Attending: Internal Medicine | Admitting: Internal Medicine

## 2013-06-25 ENCOUNTER — Ambulatory Visit (INDEPENDENT_AMBULATORY_CARE_PROVIDER_SITE_OTHER): Payer: Medicare Other | Admitting: Internal Medicine

## 2013-06-25 ENCOUNTER — Encounter: Payer: Self-pay | Admitting: Internal Medicine

## 2013-06-25 VITALS — BP 100/58 | HR 102 | Ht 68.0 in | Wt 231.8 lb

## 2013-06-25 DIAGNOSIS — J449 Chronic obstructive pulmonary disease, unspecified: Secondary | ICD-10-CM

## 2013-06-25 DIAGNOSIS — Z23 Encounter for immunization: Secondary | ICD-10-CM

## 2013-06-25 NOTE — Addendum Note (Signed)
Addended by: Gwynneth Aliment A on: 06/25/2013 12:39 PM   Modules accepted: Orders

## 2013-06-25 NOTE — Assessment & Plan Note (Signed)
#  COPD - stable and improved after change to BREO - no wheezing this visit - continue spiriva  - continue  BREO 1 puff daily; CMA will do prior auth and script. IF expensive, call - flu shot at [facility] - have pneumovax 06/25/2013  - continue rehab   #FOllowup 6 months for copd management Keep me posted about your surgery date 

## 2013-06-25 NOTE — Patient Instructions (Addendum)
#  COPD - stable and improved after change to BREO - no wheezing this visit - continue spiriva  - continue  BREO 1 puff daily; CMA will do prior auth and script. IF expensive, call - flu shot at [facility] volunteer services - have pneumovax 06/25/2013  - continue rehab   #FOllowup 6 months for copd management Keep me posted about your surgery date

## 2013-06-25 NOTE — Progress Notes (Signed)
Subjective:    Patient ID: [name], female    DOB: 03-Oct-1946, 67 y.o.   MRN: [id number]  HPI reports that she quit smoking about 37 years ago. Her smoking use included Cigarettes. She has a 15 pack-year smoking history. She has never used smokeless tobacco.  #Obese  Body mass index is 34.85 kg/(m^2). Body mass index is 35.98 kg/(m^2). - 05/03/2013 Body mass index is 35.25 kg/(m^2). on 06/25/2013      #Ex 15 pack smoker. Quit 1970s  #COPD/ASthma - Baseline CAT score 18 in May 2013 (poor score is 4 sleep quality, level 3 for dyspnea/fatigue)  #Pulmonary nodules  -  Colon Cancer during cancer Rx had CT chest Oct 2012 and pulmonary nodules noted  - October 2012: There is an irregular nodule in the right upper lobe measuring 12 mm which is unchanged from prior and has adjacent bronchiectasis also unchanged. Within the left lower lobe, there is a 4 mm pulmonary nodule (image 30) which is unchanged.  - October 2013: No change on CT chest Oct 2013   #colon Cancer  - 2012 (early) - s/p surgery and chemo  #Arctic valve stenosis  0 Dr. Rollene Fare  #Exacerbations of obstructive lung disease   - November 2013: Treatment from office with Levaquin and Medrol Dosepak  - March 2-14: Prednisone opd for flare up     OV .01/01/2013  Follows up for COPD/asthma.  - She is reporting chronic worsening of dyspnea for the last several months but the worsening is mild overall that she feels stable. In fact on COPD cat score is only 9 and is much better than baseline of 18 and also much improved compared to 38 at last visit in November 2013 when she was in an exacerbation. She says that her aortic  valve stenosis is stable and is being monitored by [facility] cardiology Dr. Rollene Fare. She feels deconditioned and is wondering if she needs to be attend pulmonary rehabilitation maintenance program. She denies any overt wheezing or worsening cough or sputum but on exam she was actively wheezing  this visit   REC  #COPD  - You have mild wheezing even though you feel well  Take prednisone 40 mg daily x 2 days, then 20mg  daily x 2 days, then 10mg  daily x 2 days, then 5mg  daily x 2 days and stop  I will re\re refer you to pulmonary rehabilitation, you can start that for maintenance program  Start N. acetylcysteine 600 mg twice a day  - [place] study published in [place] in 2014 shows it helps  - this is not to make you feel better but to to prevent recurrent bronchitis episodes  - You are more likely to get this drug at [facility] or a vitamin store then at [employer] or [place] or [employer]  - You can also get this at website [employer]  - It functions as an anti-oxidant and there are minimal/none side effects  #Followup  4-5 months with COPD cat score at followup   OV 05/03/2013  FU for copd/asthma  - Has been told that AS is severe and needs AVR. SO she has joined rehab. Has seen dr Cyndia Bent. COPD itrself is stable. No AECOPD. Continues with mdi. Feels good. Last time she was asymtoatic and we gave pred becuaes of wheeeon exam. THis timme too though baseline has occ wheeze despite spiriva and advair.  COPD CAT score is 19 but says this is stable.   HEr obesity continues to be a problem;  says she is trying not to eat and joining rehab but unable to lose weight. I counseled her about diet and she rolled her eyes when I suggested list of foods to eat and list of foods to avoid. She acknowledged that it would be difficult for her to make a change. She thinks that weight loss involves eating less. She does not realize that weight loss involves eating different which he admits is a harder challenge      Past, Family, Social reviewed: Has seen Dr Laneta Simmers for aortic stenosis and has been recommended valve replacement   #COPD  - some wheezing depsite subjective baseline state - continue spiriva  - chagne ADVAIR to BREO 1 puff daily - flu shot in fall  - continue  rehab  #Weight mgmt #WEight Management    - we discussed extensively about weight management   - follow low glycemic diet plan that I outlined for you after extensive discussion. Do not follow other plans  #ROV 2 months    OV 06/25/2013  Followup COPD and weight management of obesity  - Terms of weight management: She has not lost any weight. She flatly told me that she's not interested in the low glycemic diet because it is too difficult.  - Terms of COPD: She's not taking Spiriva and Brio. After change to Brio the COPD cat score is improved and is now 12.5. She feels a COPD status is stable. She is compliant with her medications. Especially Spiriva and Brio. She has not had a flu shot but will have it at her work at [employer] hospital. She wants Pneumovax administered today.  In terms of her attic stenosis: She does not know her surgery date and a followup with her cardiologist is pending   CAT COPD Symptom & Quality of Life Score (GSK trademark) 0 is no burden. 5 is highest burden 09/04/2012 Exacerbation  01/01/2013  05/03/2013  06/25/2013 With breo  Never Cough -> Cough all the time 4 2 2  1.5  No phlegm in chest -> Chest is full of phlegm 4 0.5 1 0.5  No chest tightness -> Chest feels very tight 5 0.5 3 1.5  No dyspnea for 1 flight stairs/hill -> Very dyspneic for 1 flight of stairs 5 3.5 4 4   No limitations for ADL at home -> Very limited with ADL at home 5 1.5 2 2   Confident leaving home -> Not at all confident leaving home 5 0 1 0  Sleep soundly -> Do not sleep soundly because of lung condition 5 0 4 0  Lots of Energy -> No energy at all 5 1 2.5 3  TOTAL Score (max 40)  38 9 19.5 12.5   Review of Systems  Constitutional: Negative for fever and unexpected weight change.  HENT: Negative for ear pain, nosebleeds, congestion, sore throat, rhinorrhea, sneezing, trouble swallowing, dental problem, postnasal drip and sinus pressure.   Eyes: Negative for redness and itching.   Respiratory: Negative for cough, chest tightness, shortness of breath and wheezing.   Cardiovascular: Negative for palpitations and leg swelling.  Gastrointestinal: Negative for nausea and vomiting.  Genitourinary: Negative for dysuria.  Musculoskeletal: Negative for joint swelling.  Skin: Negative for rash.  Neurological: Negative for headaches.  Hematological: Does not bruise/bleed easily.  Psychiatric/Behavioral: Negative for dysphoric mood. The patient is not nervous/anxious.        Objective:   Physical Exam Constitutional: She is oriented to person, place, and time. She appears well-developed and well-nourished. No distress  HENT:  Head: Normocephalic and atraumatic.  Right Ear: External ear normal.  Left Ear: External ear normal.  Mouth/Throat: Oropharynx is clear and moist. No oropharyngeal exudate.  Eyes: Conjunctivae and EOM are normal. Pupils are equal, round, and reactive to light. Right eye exhibits no discharge. Left eye exhibits no discharge. No scleral icterus.  Neck: Normal range of motion. Neck supple. No JVD present. No tracheal deviation present. No thyromegaly present.  Cardiovascular: Normal rate, regular rhythm and intact distal pulses.  Exam reveals no gallop and no friction rub.   Murmur heard.      Ejection systolic murmur + Pulmonary/Chest: Effort normal. No respiratory distress.  She exhibits no tenderness.       No distress t Abdominal: Soft. Bowel sounds are normal. She exhibits no distension and no mass. There is no tenderness. There is no rebound and no guarding.  Musculoskeletal: Normal range of motion. She exhibits no edema and no tenderness.  Lymphadenopathy:    She has no cervical adenopathy.  Neurological: She is alert and oriented to person, place, and time. She has normal reflexes. No cranial nerve deficit. She exhibits normal muscle tone. Coordination normal.  Skin: Skin is warm and dry. No rash noted. She is not diaphoretic. No erythema. No  pallor.  Psychiatric: She has a normal mood and affect. Her behavior is normal. Judgment and thought content normal.          Assessment & Plan:

## 2013-06-26 ENCOUNTER — Encounter (HOSPITAL_COMMUNITY): Payer: Self-pay

## 2013-06-27 ENCOUNTER — Encounter (HOSPITAL_COMMUNITY)
Admission: RE | Admit: 2013-06-27 | Discharge: 2013-06-27 | Disposition: A | Discharge status: Home / Self Care | Payer: Self-pay | Source: Ambulatory Visit | Attending: Internal Medicine | Admitting: Internal Medicine

## 2013-06-28 ENCOUNTER — Encounter (HOSPITAL_COMMUNITY): Payer: Self-pay

## 2013-07-01 ENCOUNTER — Other Ambulatory Visit (HOSPITAL_COMMUNITY): Payer: Self-pay | Admitting: Cardiovascular Disease

## 2013-07-01 ENCOUNTER — Encounter (HOSPITAL_COMMUNITY): Payer: Self-pay

## 2013-07-01 DIAGNOSIS — I350 Nonrheumatic aortic (valve) stenosis: Secondary | ICD-10-CM

## 2013-07-02 ENCOUNTER — Encounter (HOSPITAL_COMMUNITY)
Admission: RE | Admit: 2013-07-02 | Discharge: 2013-07-02 | Disposition: A | Discharge status: Home / Self Care | Payer: Self-pay | Source: Ambulatory Visit | Attending: Internal Medicine | Admitting: Internal Medicine

## 2013-07-03 ENCOUNTER — Encounter (HOSPITAL_COMMUNITY): Payer: Self-pay

## 2013-07-04 ENCOUNTER — Ambulatory Visit (HOSPITAL_COMMUNITY)
Admission: RE | Admit: 2013-07-04 | Discharge: 2013-07-04 | Disposition: A | Discharge status: Home / Self Care | Payer: Medicare Other | Source: Ambulatory Visit | Attending: Cardiovascular Disease | Admitting: Cardiovascular Disease

## 2013-07-04 ENCOUNTER — Encounter (HOSPITAL_COMMUNITY): Payer: Self-pay

## 2013-07-04 DIAGNOSIS — I350 Nonrheumatic aortic (valve) stenosis: Secondary | ICD-10-CM

## 2013-07-04 DIAGNOSIS — I359 Nonrheumatic aortic valve disorder, unspecified: Secondary | ICD-10-CM | POA: Insufficient documentation

## 2013-07-04 NOTE — Progress Notes (Signed)
2D Echo Performed 07/04/2013    Taelynn Mcelhannon, RCS  

## 2013-07-05 ENCOUNTER — Encounter (HOSPITAL_COMMUNITY): Payer: Self-pay

## 2013-07-05 ENCOUNTER — Other Ambulatory Visit: Payer: Self-pay | Admitting: Cardiovascular Disease

## 2013-07-05 LAB — CBC WITH DIFFERENTIAL/PLATELET
Basophils Absolute: 0 10*3/uL (ref 0.0–0.1)
Basophils Relative: 1 % (ref 0–1)
Eosinophils Absolute: 0.2 10*3/uL (ref 0.0–0.7)
Eosinophils Relative: 4 % (ref 0–5)
HCT: 40.7 % (ref 36.0–46.0)
Hemoglobin: 13.6 g/dL (ref 12.0–15.0)
Lymphocytes Relative: 19 % (ref 12–46)
Lymphs Abs: 0.8 10*3/uL (ref 0.7–4.0)
MCH: 30.5 pg (ref 26.0–34.0)
MCHC: 33.4 g/dL (ref 30.0–36.0)
MCV: 91.3 fL (ref 78.0–100.0)
Monocytes Absolute: 0.5 10*3/uL (ref 0.1–1.0)
Monocytes Relative: 11 % (ref 3–12)
Neutro Abs: 2.9 10*3/uL (ref 1.7–7.7)
Neutrophils Relative %: 65 % (ref 43–77)
Platelets: 195 10*3/uL (ref 150–400)
RBC: 4.46 MIL/uL (ref 3.87–5.11)
RDW: 14.4 % (ref 11.5–15.5)
WBC: 4.4 10*3/uL (ref 4.0–10.5)

## 2013-07-05 LAB — COMPREHENSIVE METABOLIC PANEL
ALT: 11 U/L (ref 0–35)
AST: 18 U/L (ref 0–37)
Albumin: 4 g/dL (ref 3.5–5.2)
Alkaline Phosphatase: 81 U/L (ref 39–117)
BUN: 20 mg/dL (ref 6–23)
CO2: 29 mEq/L (ref 19–32)
Calcium: 9.6 mg/dL (ref 8.4–10.5)
Chloride: 102 mEq/L (ref 96–112)
Creat: 1.02 mg/dL (ref 0.50–1.10)
Glucose, Bld: 98 mg/dL (ref 70–99)
Potassium: 4.6 mEq/L (ref 3.5–5.3)
Sodium: 138 mEq/L (ref 135–145)
Total Bilirubin: 0.5 mg/dL (ref 0.3–1.2)
Total Protein: 6.4 g/dL (ref 6.0–8.3)

## 2013-07-05 LAB — LIPID PANEL
Cholesterol: 181 mg/dL (ref 0–200)
HDL: 51 mg/dL (ref >39)
LDL Cholesterol: 107 mg/dL — ABNORMAL HIGH (ref 0–99)
Total CHOL/HDL Ratio: 3.5 Ratio
Triglycerides: 117 mg/dL (ref <150)
VLDL: 23 mg/dL (ref 0–40)

## 2013-07-05 LAB — TSH: TSH: 1.542 u[IU]/mL (ref 0.350–4.500)

## 2013-07-08 ENCOUNTER — Encounter (HOSPITAL_COMMUNITY): Payer: Self-pay

## 2013-07-09 ENCOUNTER — Encounter (HOSPITAL_COMMUNITY): Payer: Self-pay

## 2013-07-09 ENCOUNTER — Encounter: Payer: Self-pay | Admitting: Cardiovascular Disease

## 2013-07-10 ENCOUNTER — Encounter (HOSPITAL_COMMUNITY): Payer: Self-pay

## 2013-07-11 ENCOUNTER — Encounter (HOSPITAL_COMMUNITY)
Admission: RE | Admit: 2013-07-11 | Discharge: 2013-07-11 | Disposition: A | Discharge status: Home / Self Care | Payer: Self-pay | Source: Ambulatory Visit | Attending: Internal Medicine | Admitting: Internal Medicine

## 2013-07-11 DIAGNOSIS — I080 Rheumatic disorders of both mitral and aortic valves: Secondary | ICD-10-CM | POA: Insufficient documentation

## 2013-07-11 DIAGNOSIS — I369 Nonrheumatic tricuspid valve disorder, unspecified: Secondary | ICD-10-CM | POA: Insufficient documentation

## 2013-07-12 ENCOUNTER — Encounter (HOSPITAL_COMMUNITY): Payer: Self-pay

## 2013-07-12 ENCOUNTER — Ambulatory Visit (INDEPENDENT_AMBULATORY_CARE_PROVIDER_SITE_OTHER): Payer: Medicare Other | Admitting: General Surgery

## 2013-07-15 ENCOUNTER — Telehealth: Payer: Self-pay | Admitting: Internal Medicine

## 2013-07-15 ENCOUNTER — Encounter (HOSPITAL_COMMUNITY): Payer: Self-pay

## 2013-07-15 MED ORDER — FLUTICASONE FUROATE-VILANTEROL 100-25 MCG/INH IN AEPB: 1.0000 | INHALATION_SPRAY | Freq: Every day | RESPIRATORY_TRACT | Status: DC

## 2013-07-15 NOTE — Telephone Encounter (Signed)
I spoke with [name]. RX for breo was never sent from OV. She has not tried getting RX from the pharmacy. I advised her will send in RX. Once her insurance is ran for this medication and is too expensive then she needs to call us. We do not know the price until this goes through her insurance. She stated if too expensive then she will call us back. Nothing further needed

## 2013-07-16 ENCOUNTER — Encounter (HOSPITAL_COMMUNITY)
Admission: RE | Admit: 2013-07-16 | Discharge: 2013-07-16 | Disposition: A | Discharge status: Home / Self Care | Payer: Self-pay | Source: Ambulatory Visit | Attending: Internal Medicine | Admitting: Internal Medicine

## 2013-07-17 ENCOUNTER — Encounter (HOSPITAL_COMMUNITY): Payer: Self-pay

## 2013-07-18 ENCOUNTER — Encounter (HOSPITAL_COMMUNITY)
Admission: RE | Admit: 2013-07-18 | Discharge: 2013-07-18 | Disposition: A | Discharge status: Home / Self Care | Payer: Self-pay | Source: Ambulatory Visit | Attending: Internal Medicine | Admitting: Internal Medicine

## 2013-07-19 ENCOUNTER — Encounter (HOSPITAL_COMMUNITY): Payer: Self-pay

## 2013-07-22 ENCOUNTER — Encounter (HOSPITAL_COMMUNITY): Payer: Self-pay

## 2013-07-23 ENCOUNTER — Encounter (HOSPITAL_COMMUNITY): Payer: Self-pay

## 2013-07-23 ENCOUNTER — Encounter (HOSPITAL_COMMUNITY): Payer: Medicare Other

## 2013-07-23 ENCOUNTER — Other Ambulatory Visit (HOSPITAL_BASED_OUTPATIENT_CLINIC_OR_DEPARTMENT_OTHER): Payer: Medicare Other | Admitting: Lab

## 2013-07-23 ENCOUNTER — Ambulatory Visit (HOSPITAL_COMMUNITY)
Admission: RE | Admit: 2013-07-23 | Discharge: 2013-07-23 | Disposition: A | Discharge status: Home / Self Care | Payer: Medicare Other | Source: Ambulatory Visit | Attending: Oncology | Admitting: Oncology

## 2013-07-23 DIAGNOSIS — I080 Rheumatic disorders of both mitral and aortic valves: Secondary | ICD-10-CM | POA: Insufficient documentation

## 2013-07-23 DIAGNOSIS — M899 Disorder of bone, unspecified: Secondary | ICD-10-CM | POA: Insufficient documentation

## 2013-07-23 DIAGNOSIS — I7789 Other specified disorders of arteries and arterioles: Secondary | ICD-10-CM | POA: Insufficient documentation

## 2013-07-23 DIAGNOSIS — I2510 Atherosclerotic heart disease of native coronary artery without angina pectoris: Secondary | ICD-10-CM | POA: Insufficient documentation

## 2013-07-23 DIAGNOSIS — I712 Thoracic aortic aneurysm, without rupture, unspecified: Secondary | ICD-10-CM | POA: Insufficient documentation

## 2013-07-23 DIAGNOSIS — C20 Malignant neoplasm of rectum: Secondary | ICD-10-CM

## 2013-07-23 DIAGNOSIS — N289 Disorder of kidney and ureter, unspecified: Secondary | ICD-10-CM | POA: Insufficient documentation

## 2013-07-23 DIAGNOSIS — M5380 Other specified dorsopathies, site unspecified: Secondary | ICD-10-CM | POA: Insufficient documentation

## 2013-07-23 DIAGNOSIS — K6289 Other specified diseases of anus and rectum: Secondary | ICD-10-CM | POA: Insufficient documentation

## 2013-07-23 DIAGNOSIS — R918 Other nonspecific abnormal finding of lung field: Secondary | ICD-10-CM | POA: Insufficient documentation

## 2013-07-23 LAB — BASIC METABOLIC PANEL (CC13)
Anion Gap: 9 mEq/L (ref 3–11)
BUN: 22.6 mg/dL (ref 7.0–26.0)
CO2: 26 mEq/L (ref 22–29)
Calcium: 9.4 mg/dL (ref 8.4–10.4)
Chloride: 106 mEq/L (ref 98–109)
Creatinine: 0.9 mg/dL (ref 0.6–1.1)
Glucose: 81 mg/dl (ref 70–140)
Potassium: 4.5 mEq/L (ref 3.5–5.1)
Sodium: 141 mEq/L (ref 136–145)

## 2013-07-23 MED ORDER — IOHEXOL 300 MG/ML  SOLN
100.0000 mL | Freq: Once | INTRAMUSCULAR | Status: AC | PRN
  Administered 2013-07-23: 100 mL via INTRAVENOUS

## 2013-07-23 MED ORDER — IOHEXOL 300 MG/ML  SOLN
50.0000 mL | Freq: Once | INTRAMUSCULAR | Status: AC | PRN
  Administered 2013-07-23: 50 mL via ORAL

## 2013-07-24 ENCOUNTER — Encounter (HOSPITAL_COMMUNITY): Payer: Self-pay

## 2013-07-24 ENCOUNTER — Telehealth: Payer: Self-pay | Admitting: *Deleted

## 2013-07-24 NOTE — Telephone Encounter (Signed)
Left message requesting CT results from 10/14. Note to MD desk.

## 2013-07-25 ENCOUNTER — Telehealth: Payer: Self-pay | Admitting: *Deleted

## 2013-07-25 ENCOUNTER — Encounter (HOSPITAL_COMMUNITY): Payer: Medicare Other

## 2013-07-25 NOTE — Telephone Encounter (Signed)
Notified of negative results.

## 2013-07-25 NOTE — Telephone Encounter (Signed)
Message copied by Wandalee Ferdinand on Thu Jul 25, 2013  8:42 AM ------      Message from: [name]      Created: Wed Jul 24, 2013  5:42 PM       Please call patient, CTs are negative for cancer ------

## 2013-07-26 ENCOUNTER — Encounter (HOSPITAL_COMMUNITY): Payer: Self-pay

## 2013-07-26 ENCOUNTER — Encounter (INDEPENDENT_AMBULATORY_CARE_PROVIDER_SITE_OTHER): Payer: Self-pay | Admitting: General Surgery

## 2013-07-26 ENCOUNTER — Ambulatory Visit (INDEPENDENT_AMBULATORY_CARE_PROVIDER_SITE_OTHER): Payer: Medicare Other | Admitting: General Surgery

## 2013-07-26 VITALS — BP 130/90 | HR 64 | Temp 98.6°F | Resp 14 | Ht 68.0 in | Wt 240.4 lb

## 2013-07-26 DIAGNOSIS — C20 Malignant neoplasm of rectum: Secondary | ICD-10-CM

## 2013-07-26 NOTE — Assessment & Plan Note (Signed)
No clinical or radiographic evidence of disease.  I will see her back in one year unless she is having issues.  I will leave the followup scans and labs to Dr. Truett Perna.  She is to continue MiraLAX daily to decrease her issues with constipation.

## 2013-07-26 NOTE — Patient Instructions (Signed)
Follow up with me in 1 year.    CEA, bloodwork, scans per Dr. Truett Perna.    Call earlier if you are having problems.

## 2013-07-26 NOTE — Progress Notes (Signed)
HISTORY: Patient is a 67 year old female who is approximately two years status post laparoscopic low anterior resection for rectal cancer. She is doing well overall.  She has no rectal bleeding.  She has not had any rectal pain or weight loss.  She denies nausea or vomiting.  She has had no jaundice.  She's had a colonoscopy since I saw her last. She had no evidence of disease. She did have a dilation of her anastomosis, but she did not seem to notice any difference. She is taking MiraLAX every day and this seems to help with the multiple bowel movements and difficulty evacuating her rectum. She states that she does not take it she will not have a bowel movement for around 3 days and then will have 8-10 bowel movements a day. If she takes it every day she seems to be more regular. She does complain of some gas pain.   PERTINENT REVIEW OF SYSTEMS: Otherwise negative.      EXAM: Head: Normocephalic and atraumatic.  Eyes:  Conjunctivae are normal. Pupils are equal, round, and reactive to light. No scleral icterus.  Neck:  Normal range of motion. Neck supple. No tracheal deviation present. No thyromegaly present. No lymphadenopathy.   Resp: No respiratory distress, normal effort. Abd:  Abdomen is soft, non distended and non tender. No masses are palpable.  There is no rebound and no guarding.  Rectal:  No masses, no gross blood, good sphincter tone.   Neurological: Alert and oriented to person, place, and time. Coordination normal.  Skin: Skin is warm and dry. No rash noted. No diaphoretic. No erythema. No pallor.  Psychiatric: Normal mood and affect. Normal behavior. Judgment and thought content normal.      ASSESSMENT AND PLAN:   Rectal cancer, midrectum ypT3ypN0, s/p neoadj chemoXRT, lap LAR with diverting loop ileostomy 08/2011, ileostomy takedown 03/2012 No clinical or radiographic evidence of disease.  I will see her back in one year unless she is having issues.  I will leave the  followup scans and labs to Dr. Truett Perna.  She is to continue MiraLAX daily to decrease her issues with constipation.       Maudry Diego, MD Surgical Oncology, General & Endocrine Surgery [facility]  Illene Regulus, MD Debby Bud Rosalyn Gess, MD

## 2013-07-29 ENCOUNTER — Encounter (HOSPITAL_COMMUNITY): Payer: Self-pay

## 2013-07-30 ENCOUNTER — Encounter (HOSPITAL_COMMUNITY)
Admission: RE | Admit: 2013-07-30 | Discharge: 2013-07-30 | Disposition: A | Discharge status: Home / Self Care | Payer: Self-pay | Source: Ambulatory Visit | Attending: Internal Medicine | Admitting: Internal Medicine

## 2013-07-31 ENCOUNTER — Encounter (HOSPITAL_COMMUNITY): Payer: Self-pay

## 2013-07-31 ENCOUNTER — Telehealth: Payer: Self-pay | Admitting: *Deleted

## 2013-07-31 NOTE — Telephone Encounter (Signed)
Message copied by Caleb Popp on Wed Jul 31, 2013 12:12 PM ------      Message from: Thornton Papas B      Created: Wed Jul 24, 2013  5:42 PM       Please call patient, CTs are negative for cancer ------

## 2013-08-01 ENCOUNTER — Encounter (HOSPITAL_COMMUNITY)
Admission: RE | Admit: 2013-08-01 | Discharge: 2013-08-01 | Disposition: A | Discharge status: Home / Self Care | Payer: Self-pay | Source: Ambulatory Visit | Attending: Internal Medicine | Admitting: Internal Medicine

## 2013-08-06 ENCOUNTER — Encounter (HOSPITAL_COMMUNITY)
Admission: RE | Admit: 2013-08-06 | Discharge: 2013-08-06 | Disposition: A | Discharge status: Home / Self Care | Payer: Self-pay | Source: Ambulatory Visit | Attending: Internal Medicine | Admitting: Internal Medicine

## 2013-08-08 ENCOUNTER — Encounter (HOSPITAL_COMMUNITY)
Admission: RE | Admit: 2013-08-08 | Discharge: 2013-08-08 | Disposition: A | Discharge status: Home / Self Care | Payer: Self-pay | Source: Ambulatory Visit | Attending: Internal Medicine | Admitting: Internal Medicine

## 2013-08-10 DIAGNOSIS — K56600 Partial intestinal obstruction, unspecified as to cause: Secondary | ICD-10-CM

## 2013-08-10 HISTORY — DX: Partial intestinal obstruction, unspecified as to cause: K56.600

## 2013-08-13 ENCOUNTER — Encounter (HOSPITAL_COMMUNITY): Payer: Medicare Other

## 2013-08-13 DIAGNOSIS — I080 Rheumatic disorders of both mitral and aortic valves: Secondary | ICD-10-CM | POA: Insufficient documentation

## 2013-08-13 DIAGNOSIS — I369 Nonrheumatic tricuspid valve disorder, unspecified: Secondary | ICD-10-CM | POA: Insufficient documentation

## 2013-08-15 ENCOUNTER — Encounter (HOSPITAL_COMMUNITY): Payer: Medicare Other

## 2013-08-15 ENCOUNTER — Other Ambulatory Visit: Payer: Self-pay

## 2013-08-20 ENCOUNTER — Encounter (HOSPITAL_COMMUNITY)
Admission: RE | Admit: 2013-08-20 | Discharge: 2013-08-20 | Disposition: A | Discharge status: Home / Self Care | Payer: Self-pay | Source: Ambulatory Visit | Attending: Internal Medicine | Admitting: Internal Medicine

## 2013-08-22 ENCOUNTER — Encounter (HOSPITAL_COMMUNITY): Payer: Medicare Other

## 2013-08-27 ENCOUNTER — Encounter (HOSPITAL_COMMUNITY)
Admission: RE | Admit: 2013-08-27 | Discharge: 2013-08-27 | Disposition: A | Discharge status: Home / Self Care | Payer: Self-pay | Source: Ambulatory Visit | Attending: Internal Medicine | Admitting: Internal Medicine

## 2013-08-28 ENCOUNTER — Inpatient Hospital Stay (HOSPITAL_COMMUNITY)
Admission: EM | Admit: 2013-08-28 | Discharge: 2013-08-31 | DRG: 390 | Disposition: A | Discharge status: Home / Self Care | Payer: Medicare Other | Attending: Internal Medicine | Admitting: Internal Medicine

## 2013-08-28 ENCOUNTER — Encounter (HOSPITAL_COMMUNITY): Payer: Self-pay | Admitting: Emergency Medicine

## 2013-08-28 DIAGNOSIS — E785 Hyperlipidemia, unspecified: Secondary | ICD-10-CM | POA: Diagnosis present

## 2013-08-28 DIAGNOSIS — Z85048 Personal history of other malignant neoplasm of rectum, rectosigmoid junction, and anus: Secondary | ICD-10-CM | POA: Diagnosis present

## 2013-08-28 DIAGNOSIS — K219 Gastro-esophageal reflux disease without esophagitis: Secondary | ICD-10-CM

## 2013-08-28 DIAGNOSIS — I10 Essential (primary) hypertension: Secondary | ICD-10-CM | POA: Diagnosis present

## 2013-08-28 DIAGNOSIS — Z87891 Personal history of nicotine dependence: Secondary | ICD-10-CM

## 2013-08-28 DIAGNOSIS — J449 Chronic obstructive pulmonary disease, unspecified: Secondary | ICD-10-CM | POA: Diagnosis present

## 2013-08-28 DIAGNOSIS — J45909 Unspecified asthma, uncomplicated: Secondary | ICD-10-CM

## 2013-08-28 DIAGNOSIS — K56609 Unspecified intestinal obstruction, unspecified as to partial versus complete obstruction: Principal | ICD-10-CM | POA: Diagnosis present

## 2013-08-28 DIAGNOSIS — J4489 Other specified chronic obstructive pulmonary disease: Secondary | ICD-10-CM | POA: Diagnosis present

## 2013-08-28 DIAGNOSIS — K589 Irritable bowel syndrome without diarrhea: Secondary | ICD-10-CM | POA: Diagnosis present

## 2013-08-28 DIAGNOSIS — E669 Obesity, unspecified: Secondary | ICD-10-CM | POA: Diagnosis present

## 2013-08-28 DIAGNOSIS — K56600 Partial intestinal obstruction, unspecified as to cause: Secondary | ICD-10-CM

## 2013-08-28 DIAGNOSIS — C20 Malignant neoplasm of rectum: Secondary | ICD-10-CM | POA: Diagnosis present

## 2013-08-28 DIAGNOSIS — G4730 Sleep apnea, unspecified: Secondary | ICD-10-CM | POA: Diagnosis present

## 2013-08-28 DIAGNOSIS — Z7982 Long term (current) use of aspirin: Secondary | ICD-10-CM

## 2013-08-28 LAB — URINALYSIS, ROUTINE W REFLEX MICROSCOPIC
Glucose, UA: NEGATIVE mg/dL
Ketones, ur: 15 mg/dL — AB
Leukocytes, UA: NEGATIVE
Nitrite: NEGATIVE
Protein, ur: 30 mg/dL — AB
Specific Gravity, Urine: 1.033 — ABNORMAL HIGH (ref 1.005–1.030)
Urobilinogen, UA: 1 mg/dL (ref 0.0–1.0)
pH: 5 (ref 5.0–8.0)

## 2013-08-28 LAB — CBC WITH DIFFERENTIAL/PLATELET
Basophils Absolute: 0 10*3/uL (ref 0.0–0.1)
Basophils Relative: 0 % (ref 0–1)
Eosinophils Absolute: 0 10*3/uL (ref 0.0–0.7)
Eosinophils Relative: 0 % (ref 0–5)
HCT: 44.2 % (ref 36.0–46.0)
Hemoglobin: 15.5 g/dL — ABNORMAL HIGH (ref 12.0–15.0)
Lymphocytes Relative: 4 % — ABNORMAL LOW (ref 12–46)
Lymphs Abs: 0.5 10*3/uL — ABNORMAL LOW (ref 0.7–4.0)
MCH: 32.2 pg (ref 26.0–34.0)
MCHC: 35.1 g/dL (ref 30.0–36.0)
MCV: 91.7 fL (ref 78.0–100.0)
Monocytes Absolute: 0.7 10*3/uL (ref 0.1–1.0)
Monocytes Relative: 6 % (ref 3–12)
Neutro Abs: 10.5 10*3/uL — ABNORMAL HIGH (ref 1.7–7.7)
Neutrophils Relative %: 90 % — ABNORMAL HIGH (ref 43–77)
Platelets: 182 10*3/uL (ref 150–400)
RBC: 4.82 MIL/uL (ref 3.87–5.11)
RDW: 13.2 % (ref 11.5–15.5)
WBC: 11.7 10*3/uL — ABNORMAL HIGH (ref 4.0–10.5)

## 2013-08-28 LAB — COMPREHENSIVE METABOLIC PANEL
ALT: 15 U/L (ref 0–35)
AST: 24 U/L (ref 0–37)
Albumin: 4.2 g/dL (ref 3.5–5.2)
Alkaline Phosphatase: 106 U/L (ref 39–117)
BUN: 24 mg/dL — ABNORMAL HIGH (ref 6–23)
CO2: 27 mEq/L (ref 19–32)
Calcium: 9.9 mg/dL (ref 8.4–10.5)
Chloride: 100 mEq/L (ref 96–112)
Creatinine, Ser: 1.12 mg/dL — ABNORMAL HIGH (ref 0.50–1.10)
GFR calc Af Amer: 57 mL/min — ABNORMAL LOW (ref >90)
GFR calc non Af Amer: 49 mL/min — ABNORMAL LOW (ref >90)
Glucose, Bld: 127 mg/dL — ABNORMAL HIGH (ref 70–99)
Potassium: 3.7 mEq/L (ref 3.5–5.1)
Sodium: 139 mEq/L (ref 135–145)
Total Bilirubin: 0.6 mg/dL (ref 0.3–1.2)
Total Protein: 7.8 g/dL (ref 6.0–8.3)

## 2013-08-28 LAB — POCT I-STAT TROPONIN I: Troponin i, poc: 0.06 ng/mL (ref 0.00–0.08)

## 2013-08-28 LAB — URINE MICROSCOPIC-ADD ON

## 2013-08-28 LAB — LIPASE, BLOOD: Lipase: 20 U/L (ref 11–59)

## 2013-08-28 MED ORDER — ONDANSETRON 4 MG PO TBDP
8.0000 mg | ORAL_TABLET | ORAL | Status: AC
  Administered 2013-08-28: 8 mg via ORAL
  Filled 2013-08-28: qty 2

## 2013-08-28 MED ORDER — ONDANSETRON HCL 4 MG/2ML IJ SOLN
4.0000 mg | Freq: Once | INTRAMUSCULAR | Status: AC
  Administered 2013-08-28: 4 mg via INTRAVENOUS
  Filled 2013-08-28: qty 2

## 2013-08-28 MED ORDER — SODIUM CHLORIDE 0.9 % IV BOLUS (SEPSIS)
1000.0000 mL | Freq: Once | INTRAVENOUS | Status: AC
  Administered 2013-08-28: 1000 mL via INTRAVENOUS

## 2013-08-28 MED ORDER — PANTOPRAZOLE SODIUM 40 MG IV SOLR
40.0000 mg | Freq: Once | INTRAVENOUS | Status: AC
  Administered 2013-08-28: 40 mg via INTRAVENOUS
  Filled 2013-08-28: qty 40

## 2013-08-28 MED ORDER — MORPHINE SULFATE 4 MG/ML IJ SOLN
4.0000 mg | Freq: Once | INTRAMUSCULAR | Status: AC
  Administered 2013-08-28: 4 mg via INTRAVENOUS
  Filled 2013-08-28: qty 1

## 2013-08-28 NOTE — ED Notes (Signed)
Pt. reports epigastric / mid abdominal pain onset this evening with nausea and occasional dry cough .

## 2013-08-28 NOTE — ED Provider Notes (Signed)
CSN: [id number]     Arrival date & time 08/28/13  2052 History   First MD Initiated Contact with Patient 08/28/13 2234     Chief Complaint  Patient presents with  . Abdominal Pain   (Consider location/radiation/quality/duration/timing/severity/associated sxs/prior Treatment) HPI History provided by pt.   Pt reports gradual onset, intermittent, severe, non-radiating upper abdominal pain at 3:30pm today.  Associated w/ N/V.  Denies fever, CP, worse than baseline SOB, hematemesis/hematochezia/melena, constipation or diarrhea, urinary and vaginal sx.  PMH sig for rectal cancer in 2012 w/ laparoscopic partial colectomy, IBS, "heart valve disease", HTN, hyperlipidemia, obesity and COPD.   Past Medical History  Diagnosis Date  . IBS (irritable bowel syndrome)   . Hyperlipidemia   . Allergic rhinitis   . History of pneumonia   . COPD (chronic obstructive pulmonary disease)   . Asthma   . Heart murmur     Aortic  . Lung nodule     RLL nodule-66mm stable 2006, April 2009, and June 2009  . Candidiasis of breast   . Anemia   . Hyperlipemia   . Obesity   . Unspecified essential hypertension   . Heart valve problem     PER CARDIOLOGIST 05-03-2011  . Sleep difficulties   . Hemorrhoids   . Sleep apnea     PT TOLD BORDERLINE-TRIED CPAP-DID NOT HELP-SHE DOES NOT USE CPAP  . Arthritis   . 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 History of radiation therapy 05/30/11 to 07/07/11    rectum  . Prophylactic chemotherapy     Will finish chemo on 03/04/12  . Pneumonia     hx of several times   . Shingles   . rectal ca 04/2011   Past Surgical History  Procedure Laterality Date  . Foot surgery  left foot    hammer toe  . Hand surgery  Bil    carpal tunnel  . Back surgery      2006 SPACER---2007 LUMBAR FUSION  . Tonsillectomy    . Colon resection  08/25/2011    Procedure: COLON RESECTION LAPAROSCOPIC;  Surgeon: [name], MD;  Location: [facility];  Service: General;  Laterality: N/A;  Laparoscopic Assisted  Low Anterior Resection Diverting Ostomy and onQ pain pump  . Ileostomy  08/25/2011  . Colon surgery    . Ileostomy closure  04/02/2012    Procedure: ILEOSTOMY TAKEDOWN;  Surgeon: [name], MD;  Location: [facility];  Service: General;  Laterality: N/A;  . Bowel resection  04/02/2012    Procedure: SMALL BOWEL RESECTION;  Surgeon: [name], MD;  Location: [facility];  Service: General;  Laterality: N/A;   Family History  Problem Relation Age of Onset  . Lung cancer Father   . Cancer Father     lung  . Diabetes    . Heart disease    . Heart disease Mother   . Diabetes Mother   . Hypertension Mother   . Colon cancer Neg Hx   . Esophageal cancer Neg Hx   . Stomach cancer Neg Hx   . Rectal cancer Neg Hx    History  Substance Use Topics  . Smoking status: Former Smoker -- 1.50 packs/day for 10 years    Types: Cigarettes    Quit date: 12/27/1974  . Smokeless tobacco: Never Used  . Alcohol Use: No     Comment: rare   OB History   Grav Para Term Preterm Abortions TAB SAB Ect Mult Living  Review of Systems  All other systems reviewed and are negative.    Allergies  Codeine; Prednisone; and Sulfonamide derivatives  Home Medications   Current Outpatient Rx  Name  Route  Sig  Dispense  Refill  . albuterol (VENTOLIN HFA) 108 (90 BASE) MCG/ACT inhaler   Inhalation   Inhale 2 puffs into the lungs every 6 (six) hours as needed for wheezing.   1 Inhaler   1   . aspirin 81 MG tablet   Oral   Take 81 mg by mouth at bedtime.          . Calcium Carbonate-Vitamin D (CALCIUM 600 + D PO)   Oral   Take 1,200 mg by mouth at bedtime.          Marland Kitchen diltiazem (CARDIZEM CD) 120 MG 24 hr capsule   Oral   Take 120 mg by mouth at bedtime.          . Fluticasone Furoate-Vilanterol (BREO ELLIPTA) 100-25 MCG/INH AEPB   Inhalation   Inhale 1 puff into the lungs daily.   30 each   5   . losartan (COZAAR) 50 MG tablet      50 mg at bedtime.          . lovastatin  (MEVACOR) 40 MG tablet   Oral   Take 40 mg by mouth at bedtime.         . montelukast (SINGULAIR) 10 MG tablet   Oral   Take 10 mg by mouth at bedtime.         . pantoprazole (PROTONIX) 40 MG tablet   Oral   Take 40 mg by mouth at bedtime.          . polyethylene glycol (MIRALAX / GLYCOLAX) packet   Oral   Take 17 g by mouth daily.         Marland Kitchen tiotropium (SPIRIVA HANDIHALER) 18 MCG inhalation capsule      place 1 capsule INTO INHALER and inhale daily         . tiotropium (SPIRIVA) 18 MCG inhalation capsule   Inhalation   Place 18 mcg into inhaler and inhale daily.          BP 146/61  Pulse 75  Temp(Src) 97.5 F (36.4 C) (Oral)  Resp 14  SpO2 94% Physical Exam  Nursing note and vitals reviewed. Constitutional: She is oriented to person, place, and time. She appears well-developed and well-nourished.  Uncomfortable appearing.  Actively vomiting.   HENT:  Head: Normocephalic and atraumatic.  Eyes:  Normal appearance  Neck: Normal range of motion.  Cardiovascular: Normal rate and regular rhythm.   Pulmonary/Chest: Effort normal and breath sounds normal. No respiratory distress.  Abdominal: Soft. Bowel sounds are normal. She exhibits no distension and no mass. There is no rebound and no guarding.  Diffuse, mild tenderness across upper abdomen and LLQ  Genitourinary:  No CVA tenderness  Musculoskeletal: Normal range of motion.  Neurological: She is alert and oriented to person, place, and time.  Skin: Skin is warm and dry. No rash noted.  Psychiatric: She has a normal mood and affect. Her behavior is normal.    ED Course  Procedures (including critical care time) Labs Review Labs Reviewed  CBC WITH DIFFERENTIAL - Abnormal; Notable for the following:    WBC 11.7 (*)    Hemoglobin 15.5 (*)    Neutrophils Relative % 90 (*)    Neutro Abs 10.5 (*)    Lymphocytes Relative 4 (*)  Lymphs Abs 0.5 (*)    All other components within normal limits   COMPREHENSIVE METABOLIC PANEL - Abnormal; Notable for the following:    Glucose, Bld 127 (*)    BUN 24 (*)    Creatinine, Ser 1.12 (*)    GFR calc non Af Amer 49 (*)    GFR calc Af Amer 57 (*)    All other components within normal limits  URINALYSIS, ROUTINE W REFLEX MICROSCOPIC - Abnormal; Notable for the following:    Color, Urine AMBER (*)    APPearance TURBID (*)    Specific Gravity, Urine 1.033 (*)    Hgb urine dipstick TRACE (*)    Bilirubin Urine SMALL (*)    Ketones, ur 15 (*)    Protein, ur 30 (*)    All other components within normal limits  URINE MICROSCOPIC-ADD ON - Abnormal; Notable for the following:    Crystals CA OXALATE CRYSTALS (*)    All other components within normal limits  LIPASE, BLOOD  TROPONIN I  POCT I-STAT TROPONIN I   Imaging Review Ct Abdomen Pelvis W Contrast  08/29/2013   CLINICAL DATA:  Epigastric and mid abdominal pain. Nausea. Colorectal cancer  EXAM: CT ABDOMEN AND PELVIS WITH CONTRAST  TECHNIQUE: Multidetector CT imaging of the abdomen and pelvis was performed using the standard protocol following bolus administration of intravenous contrast.  CONTRAST:  OMNIPAQUE IOHEXOL 300 MG/ML  SOLN  COMPARISON:  07/23/2013  FINDINGS: Dense mitral annular calcification.  Single small renal cysts noted bilaterally. Aortoiliac atherosclerotic calcification. No pathologic upper abdominal adenopathy is observed.  Oral contrast is present in the nondilated proximal loops of small bowel. Mid abdominal loops of dilated small bowel the 3.5 cm was scattered air-fluid levels noted. Transition point from this mildly dilated small bowel to nondilated distal small bowel occurs at the small bowel anastomotic site in the right abdomen on image 51 of series 2, likely acting as a site of partial small bowel obstruction.  There is presacral densities adjacent to the rectal anastomotic site, similar to prior exam and likely related to prior therapy. No obvious recurrent tumor.  No liver metastatic disease is observed.  Posterolateral rod and pedicle screw fixation in the lower lumbar spine.  IMPRESSION: 1. Suspected partial small bowel obstruction of the small bowel anastomotic site where there is a transition from dilated loops of small bowel to nondilated distal loops. 2. Small single bilateral renal cysts. 3. Post therapeutic findings in the presacral space with soft tissue thickening similar to prior exam. No recurrent mass in this vicinity is identified. 4. Dense mitral annular calcification.   Electronically Signed   By: Herbie Baltimore [name]   On: 08/29/2013 02:04    EKG Interpretation    Date/Time:  Wednesday August 28 2013 20:58:52 EST Ventricular Rate:  79 PR Interval:  158 QRS Duration: 162 QT Interval:  450 QTC Calculation: 516 R Axis:   -93 Text Interpretation:  Normal sinus rhythm Right bundle branch block Abnormal ECG Left axis deviation When compared with ECG of 01/05/2006, No significant change was found Confirmed by [facility]  MD, DAVID ([id number]) on 08/28/2013 11:34:41 PM            MDM   1. Partial small bowel obstruction    66yo F presents w/ h/o rectal cancer, s/p partial colectomy in 2012, as well as IBS, HTN, hyperlipidemia, obesity and "heart valve dz" presents w/ severe upper abd pain and vomiting since 3:30pm today.  On exam, afebrile,  non-toxic appearing, actively vomiting, abd soft/non-distended, diffuse upper abd ttp as well as LLQ.  EKG and labs, including troponin unremarkable.  CT abd/pelvis pending.  11:57 PM   VSS.  Pt has no complaints currently.  Has not left for CT yet.  12:54 AM   Ct consistent w/ partial SBO.  Results discussed w/ patient.  She continues to be asymptomatic.  She is mildly tachycardic at 108bpm.  A second liter bolus NS ordered.  [facility] consulted for admission.  2:39 AM     Otilio Miu, PA-C 08/29/13 [phone]

## 2013-08-28 NOTE — ED Provider Notes (Signed)
67 year old female had onset this afternoon of very umbilical pain with radiation to back with associated nausea and vomiting. 2 status post surgery for colon cancer. Pain doesn't improve after vomiting. Exam, there is only minimal periumbilical tenderness without rebound or guarding. Bowel sounds are decreased. She is scheduled for a CT scan to rule out bowel obstruction.  CT is consistent with partial small bowel obstruction and she will need to be admitted.  Medical screening examination/treatment/procedure(s) were conducted as a shared visit with non-physician practitioner(s) and myself.  I personally evaluated the patient during the encounter.  EKG Interpretation    Date/Time:  Wednesday August 28 2013 20:58:52 EST Ventricular Rate:  79 PR Interval:  158 QRS Duration: 162 QT Interval:  450 QTC Calculation: 516 R Axis:   -93 Text Interpretation:  Normal sinus rhythm Right bundle branch block Abnormal ECG Left axis deviation When compared with ECG of 01/05/2006, No significant change was found Confirmed by [facility]  MD, Jerian Morais ([id number]) on 08/28/2013 11:34:41 PM           Results for orders placed during the hospital encounter of 08/28/13  CBC WITH DIFFERENTIAL      Result Value Range   WBC 11.7 (*) 4.0 - 10.5 K/uL   RBC 4.82  3.87 - 5.11 MIL/uL   Hemoglobin 15.5 (*) 12.0 - 15.0 g/dL   HCT 16.1  09.6 - 04.5 %   MCV 91.7  78.0 - 100.0 fL   MCH 32.2  26.0 - 34.0 pg   MCHC 35.1  30.0 - 36.0 g/dL   RDW 40.9  81.1 - 91.4 %   Platelets 182  150 - 400 K/uL   Neutrophils Relative % 90 (*) 43 - 77 %   Neutro Abs 10.5 (*) 1.7 - 7.7 K/uL   Lymphocytes Relative 4 (*) 12 - 46 %   Lymphs Abs 0.5 (*) 0.7 - 4.0 K/uL   Monocytes Relative 6  3 - 12 %   Monocytes Absolute 0.7  0.1 - 1.0 K/uL   Eosinophils Relative 0  0 - 5 %   Eosinophils Absolute 0.0  0.0 - 0.7 K/uL   Basophils Relative 0  0 - 1 %   Basophils Absolute 0.0  0.0 - 0.1 K/uL  COMPREHENSIVE METABOLIC PANEL      Result Value  Range   Sodium 139  135 - 145 mEq/L   Potassium 3.7  3.5 - 5.1 mEq/L   Chloride 100  96 - 112 mEq/L   CO2 27  19 - 32 mEq/L   Glucose, Bld 127 (*) 70 - 99 mg/dL   BUN 24 (*) 6 - 23 mg/dL   Creatinine, Ser 7.82 (*) 0.50 - 1.10 mg/dL   Calcium 9.9  8.4 - 95.6 mg/dL   Total Protein 7.8  6.0 - 8.3 g/dL   Albumin 4.2  3.5 - 5.2 g/dL   AST 24  0 - 37 U/L   ALT 15  0 - 35 U/L   Alkaline Phosphatase 106  39 - 117 U/L   Total Bilirubin 0.6  0.3 - 1.2 mg/dL   GFR calc non Af Amer 49 (*) >90 mL/min   GFR calc Af Amer 57 (*) >90 mL/min  LIPASE, BLOOD      Result Value Range   Lipase 20  11 - 59 U/L  URINALYSIS, ROUTINE W REFLEX MICROSCOPIC      Result Value Range   Color, Urine AMBER (*) YELLOW   APPearance TURBID (*) CLEAR  Specific Gravity, Urine 1.033 (*) 1.005 - 1.030   pH 5.0  5.0 - 8.0   Glucose, UA NEGATIVE  NEGATIVE mg/dL   Hgb urine dipstick TRACE (*) NEGATIVE   Bilirubin Urine SMALL (*) NEGATIVE   Ketones, ur 15 (*) NEGATIVE mg/dL   Protein, ur 30 (*) NEGATIVE mg/dL   Urobilinogen, UA 1.0  0.0 - 1.0 mg/dL   Nitrite NEGATIVE  NEGATIVE   Leukocytes, UA NEGATIVE  NEGATIVE  TROPONIN I      Result Value Range   Troponin I <0.30  <0.30 ng/mL  URINE MICROSCOPIC-ADD ON      Result Value Range   Squamous Epithelial / LPF RARE  RARE   WBC, UA 0-2  <3 WBC/hpf   RBC / HPF 0-2  <3 RBC/hpf   Bacteria, UA RARE  RARE   Crystals CA OXALATE CRYSTALS (*) NEGATIVE   Urine-Other AMORPHOUS URATES/PHOSPHATES    POCT I-STAT TROPONIN I      Result Value Range   Troponin i, poc 0.06  0.00 - 0.08 ng/mL   Comment 3            Ct Abdomen Pelvis W Contrast  08/29/2013   CLINICAL DATA:  Epigastric and mid abdominal pain. Nausea. Colorectal cancer  EXAM: CT ABDOMEN AND PELVIS WITH CONTRAST  TECHNIQUE: Multidetector CT imaging of the abdomen and pelvis was performed using the standard protocol following bolus administration of intravenous contrast.  CONTRAST:  OMNIPAQUE IOHEXOL 300 MG/ML   SOLN  COMPARISON:  07/23/2013  FINDINGS: Dense mitral annular calcification.  Single small renal cysts noted bilaterally. Aortoiliac atherosclerotic calcification. No pathologic upper abdominal adenopathy is observed.  Oral contrast is present in the nondilated proximal loops of small bowel. Mid abdominal loops of dilated small bowel the 3.5 cm was scattered air-fluid levels noted. Transition point from this mildly dilated small bowel to nondilated distal small bowel occurs at the small bowel anastomotic site in the right abdomen on image 51 of series 2, likely acting as a site of partial small bowel obstruction.  There is presacral densities adjacent to the rectal anastomotic site, similar to prior exam and likely related to prior therapy. No obvious recurrent tumor. No liver metastatic disease is observed.  Posterolateral rod and pedicle screw fixation in the lower lumbar spine.  IMPRESSION: 1. Suspected partial small bowel obstruction of the small bowel anastomotic site where there is a transition from dilated loops of small bowel to nondilated distal loops. 2. Small single bilateral renal cysts. 3. Post therapeutic findings in the presacral space with soft tissue thickening similar to prior exam. No recurrent mass in this vicinity is identified. 4. Dense mitral annular calcification.   Electronically Signed   By: Herbie Baltimore [name]   On: 08/29/2013 02:04   Images viewed by me.   Dione Booze, MD 08/29/13 [phone]

## 2013-08-29 ENCOUNTER — Encounter (HOSPITAL_COMMUNITY): Payer: Medicare Other

## 2013-08-29 ENCOUNTER — Emergency Department (HOSPITAL_COMMUNITY): Payer: Medicare Other

## 2013-08-29 ENCOUNTER — Encounter (HOSPITAL_COMMUNITY): Payer: Self-pay | Admitting: Radiology

## 2013-08-29 DIAGNOSIS — K56609 Unspecified intestinal obstruction, unspecified as to partial versus complete obstruction: Secondary | ICD-10-CM | POA: Diagnosis present

## 2013-08-29 DIAGNOSIS — I10 Essential (primary) hypertension: Secondary | ICD-10-CM

## 2013-08-29 DIAGNOSIS — J449 Chronic obstructive pulmonary disease, unspecified: Secondary | ICD-10-CM

## 2013-08-29 LAB — CBC
HCT: 39.7 % (ref 36.0–46.0)
Hemoglobin: 13.3 g/dL (ref 12.0–15.0)
MCH: 31.3 pg (ref 26.0–34.0)
MCHC: 33.5 g/dL (ref 30.0–36.0)
MCV: 93.4 fL (ref 78.0–100.0)
Platelets: 204 10*3/uL (ref 150–400)
RBC: 4.25 MIL/uL (ref 3.87–5.11)
RDW: 13.5 % (ref 11.5–15.5)
WBC: 9.6 10*3/uL (ref 4.0–10.5)

## 2013-08-29 LAB — COMPREHENSIVE METABOLIC PANEL
ALT: 13 U/L (ref 0–35)
AST: 20 U/L (ref 0–37)
Albumin: 3.3 g/dL — ABNORMAL LOW (ref 3.5–5.2)
Alkaline Phosphatase: 87 U/L (ref 39–117)
BUN: 22 mg/dL (ref 6–23)
CO2: 26 mEq/L (ref 19–32)
Calcium: 8.8 mg/dL (ref 8.4–10.5)
Chloride: 104 mEq/L (ref 96–112)
Creatinine, Ser: 1.03 mg/dL (ref 0.50–1.10)
GFR calc Af Amer: 63 mL/min — ABNORMAL LOW (ref >90)
GFR calc non Af Amer: 55 mL/min — ABNORMAL LOW (ref >90)
Glucose, Bld: 109 mg/dL — ABNORMAL HIGH (ref 70–99)
Potassium: 4 mEq/L (ref 3.5–5.1)
Sodium: 140 mEq/L (ref 135–145)
Total Bilirubin: 0.5 mg/dL (ref 0.3–1.2)
Total Protein: 6.5 g/dL (ref 6.0–8.3)

## 2013-08-29 LAB — GLUCOSE, CAPILLARY: Glucose-Capillary: 106 mg/dL — ABNORMAL HIGH (ref 70–99)

## 2013-08-29 LAB — TROPONIN I: Troponin I: <0.3 ng/mL (ref <0.30)

## 2013-08-29 MED ORDER — ONDANSETRON HCL 4 MG/2ML IJ SOLN
4.0000 mg | Freq: Four times a day (QID) | INTRAMUSCULAR | Status: DC | PRN
  Administered 2013-08-29 – 2013-08-30 (×2): 4 mg via INTRAVENOUS
  Filled 2013-08-29 (×2): qty 2

## 2013-08-29 MED ORDER — ONDANSETRON HCL 4 MG/2ML IJ SOLN: 4.0000 mg | Freq: Three times a day (TID) | INTRAMUSCULAR | Status: AC | PRN

## 2013-08-29 MED ORDER — HEPARIN SODIUM (PORCINE) 5000 UNIT/ML IJ SOLN
5000.0000 [IU] | Freq: Three times a day (TID) | INTRAMUSCULAR | Status: DC
  Administered 2013-08-29 – 2013-08-30 (×4): 5000 [IU] via SUBCUTANEOUS
  Filled 2013-08-29 (×7): qty 1

## 2013-08-29 MED ORDER — BUDESONIDE-FORMOTEROL FUMARATE 160-4.5 MCG/ACT IN AERO
2.0000 | INHALATION_SPRAY | Freq: Two times a day (BID) | RESPIRATORY_TRACT | Status: DC
  Administered 2013-08-29 – 2013-08-31 (×4): 2 via RESPIRATORY_TRACT
  Filled 2013-08-29: qty 6

## 2013-08-29 MED ORDER — IOHEXOL 300 MG/ML  SOLN
100.0000 mL | Freq: Once | INTRAMUSCULAR | Status: AC | PRN
  Administered 2013-08-29: 100 mL via INTRAVENOUS

## 2013-08-29 MED ORDER — SODIUM CHLORIDE 0.9 % IV BOLUS (SEPSIS)
1000.0000 mL | Freq: Once | INTRAVENOUS | Status: AC
  Administered 2013-08-29: 1000 mL via INTRAVENOUS

## 2013-08-29 MED ORDER — FLUTICASONE FUROATE-VILANTEROL 100-25 MCG/INH IN AEPB: 1.0000 | INHALATION_SPRAY | Freq: Every day | RESPIRATORY_TRACT | Status: DC

## 2013-08-29 MED ORDER — SODIUM CHLORIDE 0.9 % IJ SOLN
3.0000 mL | Freq: Two times a day (BID) | INTRAMUSCULAR | Status: DC
  Administered 2013-08-29 – 2013-08-31 (×4): 3 mL via INTRAVENOUS

## 2013-08-29 MED ORDER — HYDRALAZINE HCL 20 MG/ML IJ SOLN: 10.0000 mg | INTRAMUSCULAR | Status: DC | PRN

## 2013-08-29 MED ORDER — MORPHINE SULFATE 4 MG/ML IJ SOLN
4.0000 mg | Freq: Once | INTRAMUSCULAR | Status: AC
  Administered 2013-08-29: 4 mg via INTRAVENOUS
  Filled 2013-08-29: qty 1

## 2013-08-29 MED ORDER — IOHEXOL 300 MG/ML  SOLN
25.0000 mL | INTRAMUSCULAR | Status: AC
  Administered 2013-08-29: 25 mL via ORAL

## 2013-08-29 MED ORDER — ALBUTEROL SULFATE HFA 108 (90 BASE) MCG/ACT IN AERS
2.0000 | INHALATION_SPRAY | Freq: Four times a day (QID) | RESPIRATORY_TRACT | Status: DC | PRN
  Filled 2013-08-29: qty 6.7

## 2013-08-29 MED ORDER — TIOTROPIUM BROMIDE MONOHYDRATE 18 MCG IN CAPS
18.0000 ug | ORAL_CAPSULE | Freq: Every day | RESPIRATORY_TRACT | Status: DC
  Administered 2013-08-29 – 2013-08-31 (×2): 18 ug via RESPIRATORY_TRACT
  Filled 2013-08-29: qty 5

## 2013-08-29 MED ORDER — PANTOPRAZOLE SODIUM 40 MG PO TBEC
40.0000 mg | DELAYED_RELEASE_TABLET | Freq: Every day | ORAL | Status: DC
  Administered 2013-08-29 – 2013-08-30 (×2): 40 mg via ORAL
  Filled 2013-08-29 (×2): qty 1

## 2013-08-29 MED ORDER — SODIUM CHLORIDE 0.9 % IV SOLN
INTRAVENOUS | Status: DC
  Administered 2013-08-29 (×3): via INTRAVENOUS

## 2013-08-29 MED ORDER — ONDANSETRON HCL 4 MG PO TABS
4.0000 mg | ORAL_TABLET | Freq: Four times a day (QID) | ORAL | Status: DC | PRN
  Administered 2013-08-30: 4 mg via ORAL
  Filled 2013-08-29: qty 1

## 2013-08-29 MED ORDER — PANTOPRAZOLE SODIUM 40 MG IV SOLR
40.0000 mg | INTRAVENOUS | Status: DC
  Administered 2013-08-29 – 2013-08-30 (×2): 40 mg via INTRAVENOUS
  Filled 2013-08-29 (×3): qty 40

## 2013-08-29 NOTE — H&P (Signed)
[facility] History and Physical  Patient: [name]  ZOX:[id number]  DOB: Jan 25, 1946  DOS: the patient was seen and examined on 08/29/2013 PCP: Illene Regulus, MD  Chief Complaint: Abdominal pain nausea and vomiting  HPI: [name] is a 67 y.o. female with Past medical history of colon cancer status post colectomy and ileostomy creation with repairing of the stoma 2 years ago, hypertension, COPD The patient is coming from home. The patient is presenting with complaint of abdominal pain that was initiated yesterday and continued throughout the day. The pain was felt like a cramping pain which is progressively worsening. This was initially with nausea and one episode of vomiting she also had some trouble breathing but denies any complaint of chest pain dizziness lightheadedness. She also denies any fever chills.she is coughing. He denies any constipation.there is no bleeding .she denies any burning urination.  Review of Systems: as mentioned in the history of present illness.  A Comprehensive review of the other systems is negative.  Past Medical History  Diagnosis Date  . IBS (irritable bowel syndrome)   . Hyperlipidemia   . Allergic rhinitis   . History of pneumonia   . COPD (chronic obstructive pulmonary disease)   . Asthma   . Heart murmur     Aortic  . Lung nodule     RLL nodule-64mm stable 2006, April 2009, and June 2009  . Candidiasis of breast   . Anemia   . Hyperlipemia   . Obesity   . Unspecified essential hypertension   . Heart valve problem     PER CARDIOLOGIST 05-03-2011  . Sleep difficulties   . Hemorrhoids   . Sleep apnea     PT TOLD BORDERLINE-TRIED CPAP-DID NOT HELP-SHE DOES NOT USE CPAP  . Arthritis   . 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 History of radiation therapy 05/30/11 to 07/07/11    rectum  . Prophylactic chemotherapy     Will finish chemo on 03/04/12  . Pneumonia     hx of several times   . Shingles   . rectal ca 04/2011   Past  Surgical History  Procedure Laterality Date  . Foot surgery  left foot    hammer toe  . Hand surgery  Bil    carpal tunnel  . Back surgery      2006 SPACER---2007 LUMBAR FUSION  . Tonsillectomy    . Colon resection  08/25/2011    Procedure: COLON RESECTION LAPAROSCOPIC;  Surgeon: [name], MD;  Location: [facility];  Service: General;  Laterality: N/A;  Laparoscopic Assisted Low Anterior Resection Diverting Ostomy and onQ pain pump  . Ileostomy  08/25/2011  . Colon surgery    . Ileostomy closure  04/02/2012    Procedure: ILEOSTOMY TAKEDOWN;  Surgeon: [name], MD;  Location: [facility];  Service: General;  Laterality: N/A;  . Bowel resection  04/02/2012    Procedure: SMALL BOWEL RESECTION;  Surgeon: [name], MD;  Location: [facility];  Service: General;  Laterality: N/A;   Social History:  reports that she quit smoking about 38 years ago. Her smoking use included Cigarettes. She has a 15 pack-year smoking history. She has never used smokeless tobacco. She reports that she does not drink alcohol or use illicit drugs. Independent for most of her  ADL.  Allergies  Allergen Reactions  . Codeine Nausea Only  . Prednisone Hives    In different places all over the body.  . Sulfonamide Derivatives Nausea Only  Family History  Problem Relation Age of Onset  . Lung cancer Father   . Cancer Father     lung  . Diabetes    . Heart disease    . Heart disease Mother   . Diabetes Mother   . Hypertension Mother   . Colon cancer Neg Hx   . Esophageal cancer Neg Hx   . Stomach cancer Neg Hx   . Rectal cancer Neg Hx     Prior to Admission medications   Medication Sig Start Date End Date Taking? Authorizing Provider  albuterol (VENTOLIN HFA) 108 (90 BASE) MCG/ACT inhaler Inhale 2 puffs into the lungs every 6 (six) hours as needed for wheezing. 01/24/12 05/03/14 Yes [name], MD  aspirin 81 MG tablet Take 81 mg by mouth at bedtime.    Yes Historical Provider, MD  Calcium  Carbonate-Vitamin D (CALCIUM 600 + D PO) Take 1,200 mg by mouth at bedtime.    Yes Historical Provider, MD  diltiazem (CARDIZEM CD) 120 MG 24 hr capsule Take 120 mg by mouth at bedtime.    Yes Historical Provider, MD  Fluticasone Furoate-Vilanterol (BREO ELLIPTA) 100-25 MCG/INH AEPB Inhale 1 puff into the lungs daily. 07/15/13  Yes [name], MD  losartan (COZAAR) 50 MG tablet 50 mg at bedtime.  09/19/11  Yes Historical Provider, MD  lovastatin (MEVACOR) 40 MG tablet Take 40 mg by mouth at bedtime.   Yes Historical Provider, MD  montelukast (SINGULAIR) 10 MG tablet Take 10 mg by mouth at bedtime.   Yes Historical Provider, MD  pantoprazole (PROTONIX) 40 MG tablet Take 40 mg by mouth at bedtime.  05/03/11  Yes Historical Provider, MD  polyethylene glycol (MIRALAX / GLYCOLAX) packet Take 17 g by mouth daily.   Yes Historical Provider, MD  tiotropium (SPIRIVA) 18 MCG inhalation capsule Place 18 mcg into inhaler and inhale daily.   Yes Historical Provider, MD    Physical Exam: Filed Vitals:   08/29/13 0045 08/29/13 0300 08/29/13 0341 08/29/13 0403  BP: 171/95 121/57  126/54  Pulse: 96 103  107  Temp:   98.1 F (36.7 C) 97.6 F (36.4 C)  TempSrc:   Oral Oral  Resp: 18 17  18   Height:    5\' 8"  (1.727 m)  Weight:    111.4 kg (245 lb 9.5 oz)  SpO2: 98% 96%  94%    General: Alert, Awake and Oriented to Time, Place and Person. Appear in mild distress Eyes: PERRL ENT: Oral Mucosa clear dry. Neck: no JVD Cardiovascular: S1 and S2 Present, aortic systolic Murmur, Peripheral Pulses Present Respiratory: Bilateral Air entry equal and Decreased, Clear to Auscultation,  norackles,noheezes Abdomen: Bowel Sound Present, Soft and minimally diffusely  tender Skin: noash Extremities: noedal edema, noalf tenderness Neurologic: Grossly Unremarkable.  Labs on Admission:  CBC:  Recent Labs Lab 08/28/13 2205  WBC 11.7*  NEUTROABS 10.5*  HGB 15.5*  HCT 44.2  MCV 91.7  PLT 182    CMP      Component Value Date/Time   NA 139 08/28/2013 2205   NA 141 07/23/2013 1303   NA 142 03/15/2012 0923   K 3.7 08/28/2013 2205   K 4.5 07/23/2013 1303   K 4.6 03/15/2012 0923   CL 100 08/28/2013 2205   CL 104 11/15/2012 0908   CL 103 03/15/2012 0923   CO2 27 08/28/2013 2205   CO2 26 07/23/2013 1303   CO2 29 03/15/2012 0923   GLUCOSE 127* 08/28/2013 2205   GLUCOSE 81  07/23/2013 1303   GLUCOSE 93 11/15/2012 0908   GLUCOSE 105 03/15/2012 0923   BUN 24* 08/28/2013 2205   BUN 22.6 07/23/2013 1303   BUN 18 03/15/2012 0923   CREATININE 1.12* 08/28/2013 2205   CREATININE 0.9 07/23/2013 1303   CREATININE 1.02 07/05/2013 0850   CALCIUM 9.9 08/28/2013 2205   CALCIUM 9.4 07/23/2013 1303   CALCIUM 9.0 03/15/2012 0923   PROT 7.8 08/28/2013 2205   PROT 6.9 11/15/2012 0908   PROT 6.9 03/15/2012 0923   ALBUMIN 4.2 08/28/2013 2205   ALBUMIN 3.3* 11/15/2012 0908   AST 24 08/28/2013 2205   AST 17 11/15/2012 0908   AST 26 03/15/2012 0923   ALT 15 08/28/2013 2205   ALT 12 11/15/2012 0908   ALT 23 03/15/2012 0923   ALKPHOS 106 08/28/2013 2205   ALKPHOS 79 11/15/2012 0908   ALKPHOS 65 03/15/2012 0923   BILITOT 0.6 08/28/2013 2205   BILITOT 0.60 11/15/2012 0908   BILITOT 1.00 03/15/2012 0923   GFRNONAA 49* 08/28/2013 2205   GFRAA 57* 08/28/2013 2205     Recent Labs Lab 08/28/13 2205  LIPASE 20   No results found for this basename: AMMONIA,  in the last 168 hours   Recent Labs Lab 08/28/13 2303  TROPONINI <0.30   BNP (last 3 results) No results found for this basename: PROBNP,  in the last 8760 hours  Radiological Exams on Admission: Ct Abdomen Pelvis W Contrast  08/29/2013   CLINICAL DATA:  Epigastric and mid abdominal pain. Nausea. Colorectal cancer  EXAM: CT ABDOMEN AND PELVIS WITH CONTRAST  TECHNIQUE: Multidetector CT imaging of the abdomen and pelvis was performed using the standard protocol following bolus administration of intravenous contrast.  CONTRAST:  OMNIPAQUE IOHEXOL 300 MG/ML  SOLN  COMPARISON:   07/23/2013  FINDINGS: Dense mitral annular calcification.  Single small renal cysts noted bilaterally. Aortoiliac atherosclerotic calcification. No pathologic upper abdominal adenopathy is observed.  Oral contrast is present in the nondilated proximal loops of small bowel. Mid abdominal loops of dilated small bowel the 3.5 cm was scattered air-fluid levels noted. Transition point from this mildly dilated small bowel to nondilated distal small bowel occurs at the small bowel anastomotic site in the right abdomen on image 51 of series 2, likely acting as a site of partial small bowel obstruction.  There is presacral densities adjacent to the rectal anastomotic site, similar to prior exam and likely related to prior therapy. No obvious recurrent tumor. No liver metastatic disease is observed.  Posterolateral rod and pedicle screw fixation in the lower lumbar spine.  IMPRESSION: 1. Suspected partial small bowel obstruction of the small bowel anastomotic site where there is a transition from dilated loops of small bowel to nondilated distal loops. 2. Small single bilateral renal cysts. 3. Post therapeutic findings in the presacral space with soft tissue thickening similar to prior exam. No recurrent mass in this vicinity is identified. 4. Dense mitral annular calcification.   Electronically Signed   By: Herbie Baltimore [name]   On: 08/29/2013 02:04     Assessment/Plan Principal Problem:   SBO (small bowel obstruction) Active Problems:   HYPERLIPIDEMIA   Unspecified essential hypertension   COPD   Rectal cancer, midrectum ypT3ypN0, s/p neoadj chemoXRT, lap LAR with diverting loop ileostomy 08/2011, ileostomy takedown 03/2012   1. SBO (small bowel obstruction) The patient is presenting with complaint of abdominal pain and has undergone a CT scan of abdomen which isn't suggestive of partial small bowel obstruction. At  present I would admit her to the hospital for supportive treatment. IV fluids will be given.  IV Zofran and Protonix will be given. Patient will be kept n.p.o. At present it appears there is no immediate indication for NG tube insertion. Possible etiology of SBO is adhesion secondary to her prior surgeries.  2. Hypertension Patient will be kept n.p.o. therefore holding her antihypertensive medications and placing her on as needed hydralazine injections  3. COPD Continue home inhaler and oxygen as needed  DVT Prophylaxis: subcutaneous Heparin Nutrition: N.p.o.   Code Status: Full   Disposition: Admitted to inpatient in telemetry unit.  Author: Lynden Oxford, MD [facility] Pager: [phone] 08/29/2013, 5:29 AM    If 7PM-7AM, please contact night-coverage [email or web address] Password [email or web address]

## 2013-08-29 NOTE — Plan of Care (Signed)
Problem: Consults Goal: General Medical Patient Education See Patient Education Module for specific education. Outcome: Progressing Educated on fall safety prevention plan. Use of call bell sytem.  Oriented to staff and unit

## 2013-08-29 NOTE — Progress Notes (Signed)
Pt admitted to the unit. Pt is alert and oriented. Pt oriented to room, staff, and call bell. Educated on fall safety plan. Bed in lowest position. Full assessment to Epic. Call bell with in reach. Educated to call for assists. Will continue to monitor. Vahe Pienta, RN 

## 2013-08-29 NOTE — ED Notes (Signed)
Patient transported to CT 

## 2013-08-29 NOTE — Progress Notes (Signed)
Patient jsut admitted with partioa SBO based on symptoms of pain, N/V and CT with transition point at anastomsis site.  At this time she reports pain is coming back but she is not nauseated.  Exam - vitals stable Cor- RRR, II/Vi systolic mm best at RSB PUlm - no increased WOB Abd - obese, soft, no guarding or rebound, absent bowel sounds.  A/P 1. GI- partial SBO- patient reports she had BM x 2. Exam for abdomen is not acute Plan Bowel rest  IV fluids  Meds for pain and nausea  No indication for surgical consult at this time.  2. PUlm - stable \ 3. HTN- agree with holding meds  4. DM - stable.   Will see this PM  M.Norins, MD (o) [phone] (c) [phone]

## 2013-08-29 NOTE — Progress Notes (Signed)
Tele call to patient: mild pain, mild nausea. Reports having 4-5 rather loose stools today. Does not feel uncomfortable.  A/P 1. GI - partial SBO - seems to be doing ok. Denies bloating or distention Plan 2 view abd in AM  Clear liquid diet  Bmet in AM     M.Nedda Gains, MD On-call Tele: [phone]

## 2013-08-30 ENCOUNTER — Inpatient Hospital Stay (HOSPITAL_COMMUNITY): Payer: Medicare Other

## 2013-08-30 DIAGNOSIS — J449 Chronic obstructive pulmonary disease, unspecified: Secondary | ICD-10-CM

## 2013-08-30 DIAGNOSIS — I10 Essential (primary) hypertension: Secondary | ICD-10-CM

## 2013-08-30 DIAGNOSIS — K56609 Unspecified intestinal obstruction, unspecified as to partial versus complete obstruction: Principal | ICD-10-CM

## 2013-08-30 LAB — BASIC METABOLIC PANEL
BUN: 18 mg/dL (ref 6–23)
CO2: 21 mEq/L (ref 19–32)
Calcium: 8.2 mg/dL — ABNORMAL LOW (ref 8.4–10.5)
Chloride: 107 mEq/L (ref 96–112)
Creatinine, Ser: 0.9 mg/dL (ref 0.50–1.10)
GFR calc Af Amer: 74 mL/min — ABNORMAL LOW (ref >90)
GFR calc non Af Amer: 64 mL/min — ABNORMAL LOW (ref >90)
Glucose, Bld: 93 mg/dL (ref 70–99)
Potassium: 4.1 mEq/L (ref 3.5–5.1)
Sodium: 138 mEq/L (ref 135–145)

## 2013-08-30 LAB — GLUCOSE, CAPILLARY: Glucose-Capillary: 97 mg/dL (ref 70–99)

## 2013-08-30 MED ORDER — PROMETHAZINE HCL 25 MG/ML IJ SOLN
12.5000 mg | Freq: Four times a day (QID) | INTRAMUSCULAR | Status: DC | PRN
  Administered 2013-08-30: 12.5 mg via INTRAVENOUS
  Filled 2013-08-30: qty 1

## 2013-08-30 MED ORDER — KETOROLAC TROMETHAMINE 30 MG/ML IJ SOLN
30.0000 mg | Freq: Four times a day (QID) | INTRAMUSCULAR | Status: DC | PRN
  Administered 2013-08-30 (×2): 30 mg via INTRAVENOUS
  Filled 2013-08-30 (×2): qty 1

## 2013-08-30 MED ORDER — DILTIAZEM HCL ER COATED BEADS 120 MG PO CP24
120.0000 mg | ORAL_CAPSULE | Freq: Every day | ORAL | Status: DC
  Administered 2013-08-30 – 2013-08-31 (×2): 120 mg via ORAL
  Filled 2013-08-30 (×2): qty 1

## 2013-08-30 MED ORDER — MORPHINE SULFATE 2 MG/ML IJ SOLN
2.0000 mg | Freq: Once | INTRAMUSCULAR | Status: AC
  Administered 2013-08-30: 2 mg via INTRAVENOUS
  Filled 2013-08-30: qty 1

## 2013-08-30 MED ORDER — LOSARTAN POTASSIUM 50 MG PO TABS
50.0000 mg | ORAL_TABLET | Freq: Every day | ORAL | Status: DC
  Administered 2013-08-30 – 2013-08-31 (×2): 50 mg via ORAL
  Filled 2013-08-30 (×2): qty 1

## 2013-08-30 NOTE — Progress Notes (Signed)
Subjective: [name] reports that she will have intermittent cramping type pain across the mid-abdomen. She has had some persistent nausea but no emesis.  Objective: Lab:  Recent Labs  08/28/13 2205 08/29/13 0630  WBC 11.7* 9.6  NEUTROABS 10.5*  --   HGB 15.5* 13.3  HCT 44.2 39.7  MCV 91.7 93.4  PLT 182 204    Recent Labs  08/28/13 2205 08/29/13 0630 08/30/13 0633  NA 139 140 138  K 3.7 4.0 4.1  CL 100 104 107  GLUCOSE 127* 109* 93  BUN 24* 22 18  CREATININE 1.12* 1.03 0.90  CALCIUM 9.9 8.8 8.2*    Imaging: 2 view abdomen - no air fluid levels, diffuse air in the colon. (preominary - radiology read pending)  Scheduled Meds: . budesonide-formoterol  2 puff Inhalation BID  . heparin  5,000 Units Subcutaneous Q8H  . pantoprazole  40 mg Oral QHS  . pantoprazole (PROTONIX) IV  40 mg Intravenous Q24H  . sodium chloride  3 mL Intravenous Q12H  . tiotropium  18 mcg Inhalation Daily   Continuous Infusions: . sodium chloride 100 mL/hr at 08/29/13 2327   PRN Meds:.albuterol, hydrALAZINE, ondansetron (ZOFRAN) IV, ondansetron   Physical Exam: Filed Vitals:   08/30/13 0428  BP: 145/78  Pulse: 95  Temp: 98.9 F (37.2 C)  Resp: 18    Intake/Output Summary (Last 24 hours) at 08/30/13 0840 Last data filed at 08/29/13 1800  Gross per 24 hour  Intake    720 ml  Output     10 ml  Net    710 ml   Gen'l - obese woman in no distress Cor - RRR Pulm - normal respirations Abd- obese, absent bowel sounds on exam, no guarding or rebound, minimal diffuse tenderness      Assessment/Plan: 1. GI - partial SBO - no significant pain, has had BMs, KUB - no obvious obstruction PLan Advance dit and activity.  2-3- stable  4. DM -  No hj/o diabetes - will d/c CBGs   [employer] [facility] (o) [phone]; (c) [phone] Call-grp - Patsi Sears IM  Tele: [phone]  08/30/2013, 8:31 AM

## 2013-08-31 LAB — GLUCOSE, CAPILLARY: Glucose-Capillary: 104 mg/dL — ABNORMAL HIGH (ref 70–99)

## 2013-08-31 MED ORDER — PROMETHAZINE HCL 12.5 MG PO TABS: 12.5000 mg | ORAL_TABLET | Freq: Four times a day (QID) | ORAL | Status: DC | PRN

## 2013-08-31 NOTE — Progress Notes (Signed)
Subjective: Did not sleep well and is tired. She has been able to tolerate a diet and has continued to have bowel function  Objective: Lab:  Recent Labs  08/28/13 2205 08/29/13 0630  WBC 11.7* 9.6  NEUTROABS 10.5*  --   HGB 15.5* 13.3  HCT 44.2 39.7  MCV 91.7 93.4  PLT 182 204    Recent Labs  08/28/13 2205 08/29/13 0630 08/30/13 0633  NA 139 140 138  K 3.7 4.0 4.1  CL 100 104 107  GLUCOSE 127* 109* 93  BUN 24* 22 18  CREATININE 1.12* 1.03 0.90  CALCIUM 9.9 8.8 8.2*    Imaging:  Scheduled Meds: . budesonide-formoterol  2 puff Inhalation BID  . diltiazem  120 mg Oral Daily  . losartan  50 mg Oral Daily  . pantoprazole  40 mg Oral QHS  . sodium chloride  3 mL Intravenous Q12H  . tiotropium  18 mcg Inhalation Daily   Continuous Infusions:  PRN Meds:.albuterol, hydrALAZINE, ketorolac, ondansetron (ZOFRAN) IV, ondansetron, promethazine   Physical Exam: Filed Vitals:   08/31/13 0854  BP: 148/80  Pulse: 73  Temp: 98.4 F (36.9 C)  Resp: 20    See d/c summary    Assessment/Plan: For d/c home. Dictated # [id number]   Illene Regulus [facility] (o) [phone]; (c) [phone] Call-grp - Patsi Sears IM  Tele: [phone]  08/31/2013, 9:20 AM

## 2013-08-31 NOTE — Progress Notes (Signed)
Patient discharge teaching given, including activity, diet, follow-up appoints, and medications. Patient verbalized understanding of all discharge instructions. IV access was d/c'd. Vitals are stable. Skin is intact except as charted in most recent assessments. Pt to be escorted out by NT, to be driven home by family.  Denaya Horn, MBA, BS, RN 

## 2013-09-01 ENCOUNTER — Encounter: Payer: Self-pay | Admitting: Internal Medicine

## 2013-09-01 NOTE — Discharge Summary (Signed)
NAMEHEBAH, [name]              ACCOUNT NO.:  [id number]  MEDICAL RECORD NO.:  [id number]  LOCATION:  6E27C                        FACILITY:  [facility]  PHYSICIAN:  Rosalyn Gess. Graysen Woodyard, MD  DATE OF BIRTH:  April 01, 1946  DATE OF ADMISSION:  08/28/2013 DATE OF DISCHARGE:  08/31/2013                              DISCHARGE SUMMARY   ADMITTING DIAGNOSES: 1. Partial small bowel obstruction. 2. Essential hypertension. 3. Chronic obstructive pulmonary disease. 4. Rectal cancer status post mid colon resection with diverting loop     ileostomy with takedown followed.  DISCHARGE DIAGNOSES: 1. Partial small bowel obstruction. 2. Essential hypertension. 3. Chronic obstructive pulmonary disease. 4. Rectal cancer status post mid colon resection with diverting loop     ileostomy with takedown followed.  PROCEDURES/IMAGING:  CT of the abdomen and pelvis on August 29, 2013, showing suspected partial small bowel obstruction at the small bowel anastomotic site where there is a transition from dilated loops of small bowel, nondilated distal loops.  Small single bilateral renal cyst. Post therapeutic findings in the presacral space, with soft tissue thickening similar to prior exam.  No recurrent mass in this vicinity. Dense mitral annular calcification is noted.  Two-view of the abdomen on August 30, 2013, which showed decreasing calibre of gas, distended small bowel loops when compared to the recent CT.  This may indicate improvement versus fluid filling.  HISTORY OF THE PRESENT ILLNESS:  [name] is a 67 year old woman with a past medical history significant for colon cancer, status post colectomy with ileostomy creation followed by a takedown.  She has hypertension and COPD.  The patient presents complaining of abdominal pain that started 24 hours prior to admission and continued throughout the day.  She describes the pain as a cramping discomfort that was progressively getting worse.  She  initially had nausea with one episode of emesis.  She did admit to some trouble with breathing, but denied any chest pain, dizziness, or lightheadedness.  The patient further denied any fevers, chills, coughing.  She denied constipation, GI bleeding, or burning with urination.  Because of partial small bowel obstruction on CT scan along with the patient's symptoms, she was admitted for treatment.  Please see the H and P for past medical history, family history, social history as well as other epic notes.  HOSPITAL COURSE: 1. GI:  The patient was put to bowel rest.  She did not require NG     tube placement.  Over the next 48 hours, she slowly improved with     marked decreased abdominal pain.  She had some persistent nausea,     but no vomiting.  She did well with Phenergan.  Followup x-rays     showed improvement.  Physical exam also showed improvement. The patient was able to take a diet.  She had several bowel     movements and was passing gas.  She had no significant abdominal     findings on exam.  At this point, she is ready for discharge to     home.  She will be continued on Phenergan as needed.  She will     advance her diet carefully.  She will be  seen in the office in 5     days for followup. 2. Pulmonary:  The patient has a history of COPD with chronic     bronchitis.  She had no fever.  No productive cough during this     stay.  She does have some mild chronic wheezing.  Although she     complained of a scratchy throat, she had no other evidence of     infection and no antibiotics were initiated. 3. Hypertension.  The patient's medications were initially held during     admission, but will be started 24 hours prior to discharge.  Her     blood pressure was stable on her home regimen.   Of note, the patient was put on CBG on admission, but she is not diabetic nor has she ever demonstrated diabetes.  DISCHARGE PHYSICAL EXAMINATION:  VITAL SIGNS:  Temperature was  98.4, blood pressure 148/80, heart rate 73, respirations 20, oxygen saturations 94% on room air. GENERAL APPEARANCE:  This is an overweight woman sitting up in a chair, in no acute distress. HEENT:  Conjunctiva and sclerae are clear. NECK:  Supple. CHEST:  Increased AP diameter. PULMONARY:  No increased work of breathing.  On auscultation, the patient had end-expiratory wheezing that was bibasilar locations. CARDIOVASCULAR:  2+ radial pulse.  She had a quiet precordium.  She has a 2/6 persistent systolic murmur heard best at the right sternal border. Also perceived a murmur at 2+/6 at the apex. ABDOMEN:  Obese, soft.  She has very hypoactive bowel sounds.  No guarding.  No tenderness was elicited. GENITALIA/RECTAL:  Deferred. EXTREMITIES:  Without clubbing, cyanosis, edema, or deformity. NEURO:  The patient is awake and alert.  She is oriented to person, place, time, and context.  FINAL LABORATORY:  Chemistries from August 30, 2013, with sodium of 138, potassium 4.1, chloride 107, CO2 of 21, BUN of 18, creatinine 0.9, glucose was 93.  CBC from August 29, 2013, with a white count of 9600, hemoglobin 13.3 g, platelet count 204,000.  DISCHARGE MEDICATIONS: 1. Albuterol metered dose inhaler 2 puffs q.6 h. p.r.n. wheezing,     aspirin 81 mg daily, calcium carbonate with vitamin D. 2. Diltiazem CD 120 mg once daily. 3. Breo Ellipta 1 inhalation daily. 4. Losartan 50 mg daily. 5. Lovastatin 40 mg q. p.m. 6. Singulair 10 mg daily. 7. Protonix 40 mg q.a.m. 8. MiraLAX 17 g daily. 9. Spiriva 18 mcg 1 inhalation daily. 10.Phenergan 12.5 mg tablets 1 p.o. q.6 h. p.r.n.  DISPOSITION:  The patient is discharged to home.  She will resume all of her normal activities.  She will be contacted for a followup office appointment on this coming Wednesday.  The patient's condition at time of discharge dictation is improved.     Rosalyn Gess Indigo Barbian, MD     MEN/MEDQ  D:  08/31/2013  T:   09/01/2013  Job:  [id number]  cc:   [name], MD

## 2013-09-02 ENCOUNTER — Telehealth: Payer: Self-pay | Admitting: Internal Medicine

## 2013-09-02 MED ORDER — METHYLPREDNISOLONE (PAK) 4 MG PO TABS: ORAL_TABLET | ORAL | Status: DC

## 2013-09-02 MED ORDER — MOXIFLOXACIN HCL 400 MG PO TABS: 400.0000 mg | ORAL_TABLET | Freq: Every day | ORAL | Status: DC

## 2013-09-02 NOTE — Telephone Encounter (Signed)
Called patient - she reports O2 sat of 79% RA and tight and wheezing.  Plan Avelox 400 mg q d x 7  Medrol 4 mg dose pak.  If O2 sat does not improve she is to call back.  Just discharged after bout of abdominal pain and ? Partial SBO. She reports she has been having diarrhea but not so much abdominal pain. If her symptoms she is to call back.

## 2013-09-02 NOTE — Telephone Encounter (Signed)
Pt called to follow up with the request that was send from my chart. Please call pt ASAP.

## 2013-09-02 NOTE — Telephone Encounter (Signed)
Did you see this Dr Debby Bud?

## 2013-09-02 NOTE — Telephone Encounter (Signed)
Pt sent a My Chart e-mail yesterday.  She is very sick.  Weak, hot and cold.  Oxygen was 78 last time she checked (4:15 pm today) 80 now with heart rate of 103.  Short of breath.  Hoarse and wheezing.  She is requesting a call and maybe something called in.

## 2013-09-03 ENCOUNTER — Encounter (HOSPITAL_COMMUNITY): Payer: Medicare Other

## 2013-09-04 ENCOUNTER — Encounter: Payer: Self-pay | Admitting: Internal Medicine

## 2013-09-04 ENCOUNTER — Ambulatory Visit (INDEPENDENT_AMBULATORY_CARE_PROVIDER_SITE_OTHER): Payer: Medicare Other | Admitting: Internal Medicine

## 2013-09-04 ENCOUNTER — Telehealth: Payer: Self-pay | Admitting: General Practice

## 2013-09-04 VITALS — BP 132/92 | HR 66 | Temp 98.0°F | Wt 240.0 lb

## 2013-09-04 DIAGNOSIS — J209 Acute bronchitis, unspecified: Secondary | ICD-10-CM

## 2013-09-04 DIAGNOSIS — J45901 Unspecified asthma with (acute) exacerbation: Secondary | ICD-10-CM

## 2013-09-04 DIAGNOSIS — J45909 Unspecified asthma, uncomplicated: Secondary | ICD-10-CM

## 2013-09-04 DIAGNOSIS — J441 Chronic obstructive pulmonary disease with (acute) exacerbation: Secondary | ICD-10-CM

## 2013-09-04 DIAGNOSIS — K56609 Unspecified intestinal obstruction, unspecified as to partial versus complete obstruction: Secondary | ICD-10-CM

## 2013-09-04 NOTE — Assessment & Plan Note (Deleted)
Acute exacerbation of asthmatic COPD - looks like you are responding to treatment. I am concerned about the low oxygen. Plan Finish avelox and medrol dosepak  Nebulizer treatments 4 times a day  Continue routine meds  For persistently low oxygen levels, i.e. Less than 88% you will need to see me or Dr. Marchelle Gearing

## 2013-09-04 NOTE — Telephone Encounter (Signed)
Transitional care call:  Patient dc'd on 11/22.  Hospital dc diagnosis:  Partial small bowel obstruction.  Spoke with patient.  Patient denied questions regarding hospital dc instructions.  RN reviewed medications with patient and patient states that all medications are in the home.  Patient denies questions for Dr. Debby Bud at this time.  Patient has f/u appointment with Dr. Debby Bud on 11/26 @ 11.

## 2013-09-04 NOTE — Progress Notes (Signed)
Pre visit review using our clinic review tool, if applicable. No additional management support is needed unless otherwise documented below in the visit note. 

## 2013-09-04 NOTE — Progress Notes (Signed)
Subjective:    Patient ID: [name], female    DOB: [birth year], 67 y.o.   MRN: [id number]  HPI [name] was hospitalized for partial SBO which resolved w/o surgical intervention. Since d/c she did have some frequent loose stooling - she did take a dose of lomotile. She has been eating a very bland diet - grits and bagel. She has not had any pain or bloating, no recurrent nausea or vomiting.   Just after leaving the hospital she did develop respiratory symptoms with cough wheezing, sputum production and by her report oxygen saturation 79% to low 80%s. She was started on Avelox 400 mg daily x 7 and medrol 4 mg dose pak. She is doing better but reports hypoxemia in the 80's by home oximetry.  Past Medical History  Diagnosis Date  . IBS (irritable bowel syndrome)   . Hyperlipidemia   . Allergic rhinitis   . History of pneumonia   . COPD (chronic obstructive pulmonary disease)   . Asthma   . Heart murmur     Aortic  . Lung nodule     RLL nodule-59mm stable 2006, April 2009, and June 2009  . Candidiasis of breast   . Anemia   . Hyperlipemia   . Obesity   . Unspecified essential hypertension   . Heart valve problem     PER CARDIOLOGIST 05-03-2011  . Sleep difficulties   . Hemorrhoids   . Sleep apnea     PT TOLD BORDERLINE-TRIED CPAP-DID NOT HELP-SHE DOES NOT USE CPAP  . Arthritis   . 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 History of radiation therapy 05/30/11 to 07/07/11    rectum  . Prophylactic chemotherapy     Will finish chemo on 03/04/12  . Pneumonia     hx of several times   . Shingles   . rectal ca 04/2011   Past Surgical History  Procedure Laterality Date  . Foot surgery  left foot    hammer toe  . Hand surgery  Bil    carpal tunnel  . Back surgery      2006 SPACER---2007 LUMBAR FUSION  . Tonsillectomy    . Colon resection  08/25/2011    Procedure: COLON RESECTION LAPAROSCOPIC;  Surgeon: [name], MD;  Location: [facility];  Service: General;  Laterality: N/A;   Laparoscopic Assisted Low Anterior Resection Diverting Ostomy and onQ pain pump  . Ileostomy  08/25/2011  . Colon surgery    . Ileostomy closure  04/02/2012    Procedure: ILEOSTOMY TAKEDOWN;  Surgeon: [name], MD;  Location: [facility];  Service: General;  Laterality: N/A;  . Bowel resection  04/02/2012    Procedure: SMALL BOWEL RESECTION;  Surgeon: [name], MD;  Location: [facility];  Service: General;  Laterality: N/A;   Family History  Problem Relation Age of Onset  . Lung cancer Father   . Cancer Father     lung  . Diabetes    . Heart disease    . Heart disease Mother   . Diabetes Mother   . Hypertension Mother   . Colon cancer Neg Hx   . Esophageal cancer Neg Hx   . Stomach cancer Neg Hx   . Rectal cancer Neg Hx    History   Social History  . Marital Status: Married    Spouse Name: N/A    Number of Children: N/A  . Years of Education: N/A   Occupational History  . retired    Social History Main  Topics  . Smoking status: Former Smoker -- 1.50 packs/day for 10 years    Types: Cigarettes    Quit date: 12/27/1974  . Smokeless tobacco: Never Used  . Alcohol Use: No     Comment: rare  . Drug Use: No  . Sexual Activity: No   Other Topics Concern  . Not on file   Social History Narrative   Married 1966   2 sons; 1 grandchild   Retired-[employer]   Doing ok   March '09-financial tough times    Current Outpatient Prescriptions on File Prior to Visit  Medication Sig Dispense Refill  . albuterol (VENTOLIN HFA) 108 (90 BASE) MCG/ACT inhaler Inhale 2 puffs into the lungs every 6 (six) hours as needed for wheezing.  1 Inhaler  1  . aspirin 81 MG tablet Take 81 mg by mouth at bedtime.       . Calcium Carbonate-Vitamin D (CALCIUM 600 + D PO) Take 1,200 mg by mouth at bedtime.       Marland Kitchen diltiazem (CARDIZEM CD) 120 MG 24 hr capsule Take 120 mg by mouth at bedtime.       . Fluticasone Furoate-Vilanterol (BREO ELLIPTA) 100-25 MCG/INH AEPB Inhale 1 puff into the lungs daily.  30  each  5  . losartan (COZAAR) 50 MG tablet 50 mg at bedtime.       . lovastatin (MEVACOR) 40 MG tablet Take 40 mg by mouth at bedtime.      . methylPREDNIsolone (MEDROL DOSPACK) 4 MG tablet follow package directions  21 tablet  0  . montelukast (SINGULAIR) 10 MG tablet Take 10 mg by mouth at bedtime.      . moxifloxacin (AVELOX) 400 MG tablet Take 1 tablet (400 mg total) by mouth daily.  7 tablet  0  . pantoprazole (PROTONIX) 40 MG tablet Take 40 mg by mouth at bedtime.       . polyethylene glycol (MIRALAX / GLYCOLAX) packet Take 17 g by mouth daily.      . promethazine (PHENERGAN) 12.5 MG tablet Take 1 tablet (12.5 mg total) by mouth every 6 (six) hours as needed for nausea or vomiting.  30 tablet  0  . tiotropium (SPIRIVA) 18 MCG inhalation capsule Place 18 mcg into inhaler and inhale daily.       No current facility-administered medications on file prior to visit.      Review of Systems System review is negative for any constitutional, cardiac, pulmonary, GI or neuro symptoms or complaints other than as described in the HPI.     Objective:   Physical Exam Filed Vitals:   09/04/13 1039  BP: 132/92  Pulse: 66  Temp: 98 F (36.7 C)  O2 sat of 91% Wt Readings from Last 3 Encounters:  09/04/13 240 lb (108.863 kg)  08/29/13 241 lb 3.2 oz (109.408 kg)  07/26/13 240 lb 6.4 oz (109.045 kg)   Gen'l- overweight woman in no distress Cor - RRR Pulm - no increased WOB, end-expiratory upper airway wheezing. Abd - obese, BS+ x 4, no guarding or rebound or tenderness.         Assessment & Plan:

## 2013-09-04 NOTE — Patient Instructions (Signed)
1. Partial Small bowel Obstruction - this seems to have resovled: on exam you have good bowel sounds and no tenderness Plan Advance diet as tolerated: going from BRAT to regular  OK to restart Miralax.  2. Acute exacerbation of asthmatic COPD - looks like you are responding to treatment. I am concerned about the low oxygen. Plan Finish avelox and medrol dosepak  Nebulizer treatments 4 times a day  Continue routine meds  For persistently low oxygen levels, i.e. Less than 88% you will need to see me or Dr. Marchelle Gearing

## 2013-09-05 ENCOUNTER — Encounter (HOSPITAL_COMMUNITY): Payer: Medicare Other

## 2013-09-07 NOTE — Assessment & Plan Note (Signed)
Acute exacerbation of asthmatic COPD - looks like you are responding to treatment. I am concerned about the low oxygen. Plan Finish avelox and medrol dosepak  Nebulizer treatments 4 times a day  Continue routine meds  For persistently low oxygen levels, i.e. Less than 88% you will need to see me or Dr. Ramaswamy 

## 2013-09-07 NOTE — Assessment & Plan Note (Signed)
Hospitalized Nov '14 - Abdominal pain, Nausea/vomiting. - resolved w/o intervention. Still having a little discomfort and not back on regular diet.  PLan BRAT diet  Call for recurrent symptoms.

## 2013-09-10 ENCOUNTER — Encounter (HOSPITAL_COMMUNITY): Payer: Medicare Other

## 2013-09-10 DIAGNOSIS — I080 Rheumatic disorders of both mitral and aortic valves: Secondary | ICD-10-CM | POA: Insufficient documentation

## 2013-09-10 DIAGNOSIS — I369 Nonrheumatic tricuspid valve disorder, unspecified: Secondary | ICD-10-CM | POA: Insufficient documentation

## 2013-09-12 ENCOUNTER — Encounter (HOSPITAL_COMMUNITY): Payer: Medicare Other

## 2013-09-17 ENCOUNTER — Encounter (HOSPITAL_COMMUNITY): Payer: Medicare Other

## 2013-09-19 ENCOUNTER — Encounter (HOSPITAL_COMMUNITY): Payer: Medicare Other

## 2013-09-24 ENCOUNTER — Encounter (HOSPITAL_COMMUNITY): Payer: Medicare Other

## 2013-09-26 ENCOUNTER — Encounter (HOSPITAL_COMMUNITY): Payer: Medicare Other

## 2013-10-01 ENCOUNTER — Encounter (HOSPITAL_COMMUNITY): Admission: RE | Admit: 2013-10-01 | Payer: Medicare Other | Source: Ambulatory Visit

## 2013-10-01 ENCOUNTER — Ambulatory Visit (INDEPENDENT_AMBULATORY_CARE_PROVIDER_SITE_OTHER): Payer: Medicare Other | Admitting: Cardiovascular Disease

## 2013-10-01 ENCOUNTER — Encounter: Payer: Self-pay | Admitting: Cardiovascular Disease

## 2013-10-01 VITALS — BP 160/88 | HR 88 | Ht 68.0 in | Wt 227.3 lb

## 2013-10-01 DIAGNOSIS — I350 Nonrheumatic aortic (valve) stenosis: Secondary | ICD-10-CM

## 2013-10-01 DIAGNOSIS — I359 Nonrheumatic aortic valve disorder, unspecified: Secondary | ICD-10-CM

## 2013-10-01 HISTORY — DX: Nonrheumatic aortic (valve) stenosis: I35.0

## 2013-10-01 NOTE — Progress Notes (Signed)
Patient ID: [name], female   DOB: 08/21/1946, 67 y.o.   MRN: [id number]      Reason for office visit Aortic stenosis  This is my first meeting with [name], formerly a patient of Dr. Susa Griffins who recently retired.   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 The estimated aortic valve area was around 0.9 cm square and the mean gradient was 35 mm of mercury. Interestingly the aortic valve velocities actually appeared to have improved from her previous echocardiogram in March of 2013 when the mean aortic valve gradient was 51 mmHg. It was postulated that anemia might have been the cause for the higher gradients in 2013. Her hemoglobin has been as low as 9.6 secondary to iron deficiency which was found to be secondary to colon cancer. She subsequently undergone partial colectomy and is considered to be free of disease. She also has chronic lung disease and participates in pulmonary rehabilitation, under the care of Dr. Marchelle Gearing. Left ventricular systolic function has been normal. Echocardiography has shown severe calcification in the aortic and mitral valve annuli as well as in the intervalvular fibrosa and Dr. Laneta Simmers suggested that a TEE would be helpful in planning her valve surgery.  Notwithstanding the confounding effects of anemia and lung disease, there appears to be a clear-cut pattern of worsening exertional dyspnea over the last 6 months or so. She has to rest before she can finish cleaning her living room. She describes occasional lightheadedness with activity but has not had any chest pain at rest or with exertion. She has occasional mild ankle swelling. She does not have complaints at rest and denies  wheezing.  Additional comorbidities include treated hypertension and hyperlipidemia. She was recently hospitalized for a small bowel obstruction. This resolved spontaneously, without surgery. Repeat abdominal CT showing no evidence of cancer recurrence. She does not have atrial fibrillation and has a dilated left atrium.  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 She is worried that she will not be able to perform all her organizational duties if she has to have surgery.   Allergies  Allergen Reactions  . Codeine Nausea Only  . Prednisone Hives    In different places all over the body.  . Sulfonamide Derivatives Nausea Only    Current Outpatient Prescriptions  Medication Sig Dispense Refill  . albuterol (VENTOLIN HFA) 108 (90 BASE) MCG/ACT inhaler Inhale 2 puffs into the lungs every 6 (six) hours as needed for wheezing.  1 Inhaler  1  . aspirin 81 MG tablet Take 81 mg by mouth at bedtime.       . Calcium Carbonate-Vitamin D (CALCIUM 600 + D PO) Take 1,200 mg by mouth at bedtime.       Marland Kitchen diltiazem (CARDIZEM CD) 120 MG 24 hr capsule Take 120 mg by mouth at bedtime.       . Fluticasone Furoate-Vilanterol (BREO ELLIPTA) 100-25 MCG/INH AEPB Inhale 1 puff into the lungs daily.  30 each  5  . losartan (COZAAR) 50 MG tablet 50 mg at bedtime.       . lovastatin (MEVACOR) 40 MG tablet Take 40 mg by mouth at bedtime.      . montelukast (SINGULAIR) 10 MG  tablet Take 10 mg by mouth at bedtime.      . pantoprazole (PROTONIX) 40 MG tablet Take 40 mg by mouth at bedtime.       . polyethylene glycol (MIRALAX / GLYCOLAX) packet Take 17 g by mouth daily.      Marland Kitchen tiotropium (SPIRIVA) 18 MCG inhalation capsule Place 18 mcg into inhaler and inhale daily.       No current facility-administered medications for this visit.    Past Medical History  Diagnosis Date  . IBS (irritable bowel syndrome)   . Hyperlipidemia   . Allergic rhinitis   . History of pneumonia    . COPD (chronic obstructive pulmonary disease)   . Asthma   . Heart murmur     Aortic  . Lung nodule     RLL nodule-15mm stable 2006, April 2009, and June 2009  . Candidiasis of breast   . Anemia   . Hyperlipemia   . Obesity   . Unspecified essential hypertension   . Heart valve problem     PER CARDIOLOGIST 05-03-2011  . Sleep difficulties   . Hemorrhoids   . Sleep apnea     PT TOLD BORDERLINE-TRIED CPAP-DID NOT HELP-SHE DOES NOT USE CPAP  . Arthritis   . 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 History of radiation therapy 05/30/11 to 07/07/11    rectum  . Prophylactic chemotherapy     Will finish chemo on 03/04/12  . Pneumonia     hx of several times   . Shingles   . rectal ca 04/2011  . Severe aortic valve stenosis 10/01/2013    Past Surgical History  Procedure Laterality Date  . Foot surgery  left foot    hammer toe  . Hand surgery  Bil    carpal tunnel  . Back surgery      2006 SPACER---2007 LUMBAR FUSION  . Tonsillectomy    . Colon resection  08/25/2011    Procedure: COLON RESECTION LAPAROSCOPIC;  Surgeon: [name], MD;  Location: [facility];  Service: General;  Laterality: N/A;  Laparoscopic Assisted Low Anterior Resection Diverting Ostomy and onQ pain pump  . Ileostomy  08/25/2011  . Colon surgery    . Ileostomy closure  04/02/2012    Procedure: ILEOSTOMY TAKEDOWN;  Surgeon: [name], MD;  Location: [facility];  Service: General;  Laterality: N/A;  . Bowel resection  04/02/2012    Procedure: SMALL BOWEL RESECTION;  Surgeon: [name], MD;  Location: [facility];  Service: General;  Laterality: N/A;    Family History  Problem Relation Age of Onset  . Lung cancer Father   . Cancer Father     lung  . Diabetes    . Heart disease    . Heart disease Mother   . Diabetes Mother   . Hypertension Mother   . Colon cancer Neg Hx   . Esophageal cancer Neg Hx   . Stomach cancer Neg Hx   . Rectal cancer Neg Hx     History   Social History  . Marital Status: Married     Spouse Name: N/A    Number of Children: N/A  . Years of Education: N/A   Occupational History  . retired    Social History Main Topics  . Smoking status: Former Smoker -- 1.50 packs/day for 10 years    Types: Cigarettes    Quit date: 12/27/1974  . Smokeless tobacco: Never Used  . Alcohol Use: No     Comment: rare  .  Drug Use: No  . Sexual Activity: No   Other Topics Concern  . Not on file   Social History Narrative   Married 1966   2 sons; 1 grandchild   Retired-[employer]   Doing ok   March '09-financial tough times    Review of systems: Shortness of breath on exertion NYHA functional class II. Occasional exertional dizziness. Denies exertional angina or chest pain at rest. The patient specifically denies orthopnea, paroxysmal nocturnal dyspnea, syncope, palpitations, focal neurological deficits, intermittent claudication, lower extremity edema, unexplained weight gain, cough, hemoptysis or wheezing.  The patient also denies abdominal pain, nausea, vomiting, dysphagia, diarrhea, constipation, polyuria, polydipsia, dysuria, hematuria, frequency, urgency, abnormal bleeding or bruising, fever, chills, unexpected weight changes, mood swings, change in skin or hair texture, change in voice quality, auditory or visual problems, allergic reactions or rashes, new musculoskeletal complaints other than usual "aches and pains".   PHYSICAL EXAM BP 160/88  Pulse 88  Ht 5\' 8"  (1.727 m)  Wt 227 lb 4.8 oz (103.103 kg)  BMI 34.57 kg/m2  General: Alert, oriented x3, no distress Head: no evidence of trauma, PERRL, EOMI, no exophtalmos or lid lag, no myxedema, no xanthelasma; normal ears, nose and oropharynx Neck: normal jugular venous pulsations and no hepatojugular reflux; low volume carotid pulses with substantial delay and bilateral carotid bruits Chest: clear to auscultation, no signs of consolidation by percussion or palpation, normal fremitus, symmetrical and full respiratory  excursions Cardiovascular: normal position and quality of the apical impulse, regular rhythm, normal first and barely audible widely split second heart sounds, no rubs or gallops. Mid to late peaking aortic ejection murmur 3/6 in intensity radiating towards the carotids. I do not hear a diastolic murmur of aortic insufficiency or a diastolic rumble at the apex. It is hard to tell whether the systolic murmur that I heard at the apex is due to radiation of the aortic stenosis murmur or separate murmur of mitral insufficiency Abdomen: no tenderness or distention, no masses by palpation, no abnormal pulsatility or arterial bruits, normal bowel sounds, no hepatosplenomegaly Extremities: no clubbing, cyanosis or edema; 2+ radial, ulnar and brachial pulses bilaterally; 2+ right femoral, posterior tibial and dorsalis pedis pulses; 2+ left femoral, posterior tibial and dorsalis pedis pulses; no subclavian or femoral bruits Neurological: grossly nonfocal   EKG: Sinus rhythm, right bundle branch block, left anterior fascicular block, PR interval 170 ms, no repolarization abnormalities  Nuclear stress test June 2012 with diaphragmatic attenuation defect and normal left ventricular ejection fraction  - Left ventricle: The cavity size was mildly dilated. Wall thickness was increased in a pattern of moderate LVH. There was moderate concentric hypertrophy. Systolic function was normal. The estimated ejection fraction was in the range of 55% to 60%. Wall motion was normal; there were no regional wall motion abnormalities. Doppler parameters are consistent with abnormal left ventricular relaxation (grade 1 diastolic dysfunction). - Aortic valve: There was moderate to severe stenosis. Moderate regurgitation. Valve area: 0.89cm^2(VTI). Valve area: 0.98cm^2 (Vmax). - Mitral valve: Calcified annulus. Moderately calcified leaflets posterior. Mild to moderate regurgitation. Valve area by pressure half-time:  1.91cm^2. Valve area by continuity equation (using LVOT flow): 1.23cm^2. - Left atrium: The atrium was mildly to moderately dilated. - Right atrium: The atrium was mildly dilated. - Atrial septum: No defect or patent foramen ovale was identified. Impressions:  - Significant combined Aortic valve disease with mod.-severe AS and moderate AI. AoValve gradient less than on prior study. Mild-moderate Mitral Stensosis with severe post. leaflet & annular  Ca+2.   Lipid Panel     Component Value Date/Time   CHOL 181 07/05/2013 0850   TRIG 117 07/05/2013 0850   HDL 51 07/05/2013 0850   CHOLHDL 3.5 07/05/2013 0850   VLDL 23 07/05/2013 0850   LDLCALC 107* 07/05/2013 0850    BMET    Component Value Date/Time   NA 138 08/30/2013 0633   NA 141 07/23/2013 1303   NA 142 03/15/2012 0923   K 4.1 08/30/2013 0633   K 4.5 07/23/2013 1303   K 4.6 03/15/2012 0923   CL 107 08/30/2013 0633   CL 104 11/15/2012 0908   CL 103 03/15/2012 0923   CO2 21 08/30/2013 0633   CO2 26 07/23/2013 1303   CO2 29 03/15/2012 0923   GLUCOSE 93 08/30/2013 0633   GLUCOSE 81 07/23/2013 1303   GLUCOSE 93 11/15/2012 0908   GLUCOSE 105 03/15/2012 0923   BUN 18 08/30/2013 0633   BUN 22.6 07/23/2013 1303   BUN 18 03/15/2012 0923   CREATININE 0.90 08/30/2013 0633   CREATININE 0.9 07/23/2013 1303   CREATININE 1.02 07/05/2013 0850   CALCIUM 8.2* 08/30/2013 0633   CALCIUM 9.4 07/23/2013 1303   CALCIUM 9.0 03/15/2012 0923   GFRNONAA 64* 08/30/2013 0633   GFRAA 74* 08/30/2013 0633     ASSESSMENT AND PLAN Severe aortic valve stenosis Believe that aortic valve stenosis is her dominant valvular abnormality, judging by her physical exam with the presence of delayed carotid pulses, weak second heart sound and late peaking systolic murmur. I also think that her symptoms are clearly progressing and that she will have to have aortic valve replacement (and possibly mitral valve replacement this year. She is reluctant because of her schedule with  the [employer] reunion. We have decided to proceed with a right and left heart catheterization in January 2 fully clarify the hemodynamics of her condition. This will help her make a more definitive decision regarding the timing of her surgery. I have little doubt that she'll require surgery within the next 12 months. I think the question is whether she's had this surgery in the first 3 months of the year. Will also consider performing a transesophageal echo as outlined in Dr. Sharee Pimple notes due to the presence of heavy calcification. She is at high risk of developing complete heart block, especially if she needs a dual valve replacement, with a baseline right bundle branch block left anterior fascicular block. There is a chance she'll require permanent pacing after the procedure. She also is at risk of developing postoperative atrial fibrillation with a moderately dilated left atrium (end-systolic diameter 43 mm, volume index 40 mL per meter squared). The benefits of pre-treatment with amiodarone may be outweighed by the risk of promoting heart block. The choice of prosthetic valve is debatable. I think an aortic valve biological prosthesis would serve her well. If she does require mitral valve replacement as well, it will be a matter of judgment whether or not a biological prosthesis would provide sufficient longevity in this 67 year old.   Orders Placed This Encounter  Procedures  . APTT  . Protime-INR  . CBC  . Basic metabolic panel  . LEFT AND RIGHT HEART CATHETERIZATION WITH CORONARY ANGIOGRAM   No orders of the defined types were placed in this encounter.    Junious Silk, MD, [facility] [phone] office [phone] pager

## 2013-10-01 NOTE — Patient Instructions (Addendum)
Your physician has requested that you have a RIGHT AND LEFTcardiac catheterization 10/15/13 PM WITH DR CROITORU. Cardiac catheterization is used to diagnose and/or treat various heart conditions. Doctors may recommend this procedure for a number of different reasons. The most common reason is to evaluate chest pain. Chest pain can be a symptom of coronary artery disease (CAD), and cardiac catheterization can show whether plaque is narrowing or blocking your heart's arteries. This procedure is also used to evaluate the valves, as well as measure the blood flow and oxygen levels in different parts of your heart. For further information please visit [email or web address] Please follow instruction sheet, as given.   Your physician recommends that you return for lab work ON 10/09/13

## 2013-10-01 NOTE — Assessment & Plan Note (Signed)
Believe that aortic valve stenosis is her dominant valvular abnormality, judging by her physical exam with the presence of delayed carotid pulses, weak second heart sound and late peaking systolic murmur. I also think that her symptoms are clearly progressing and that she will have to have aortic valve replacement (and possibly mitral valve replacement this year. She is reluctant because of her schedule with the [employer] reunion. We have decided to proceed with a right and left heart catheterization in January 2 fully clarify the hemodynamics of her condition. This will help her make a more definitive decision regarding the timing of her surgery. I have little doubt that she'll require surgery within the next 12 months. I think the question is whether she's had this surgery in the first 3 months of the year. Will also consider performing a transesophageal echo as outlined in Dr. Sharee Pimple notes due to the presence of heavy calcification. She is at high risk of developing complete heart block, especially if she needs a dual valve replacement, with a baseline right bundle branch block left anterior fascicular block. There is a chance she'll require permanent pacing after the procedure. She also is at risk of developing postoperative atrial fibrillation with a moderately dilated left atrium (end-systolic diameter 43 mm, volume index 40 mL per meter squared). The benefits of pre-treatment with amiodarone may be outweighed by the risk of promoting heart block. The choice of prosthetic valve is debatable. I think an aortic valve biological prosthesis would serve her well. If she does require mitral valve replacement as well, it will be a matter of judgment whether or not a biological prosthesis would provide sufficient longevity in this 66 year old.

## 2013-10-03 ENCOUNTER — Encounter (HOSPITAL_COMMUNITY): Payer: Medicare Other

## 2013-10-08 ENCOUNTER — Encounter (HOSPITAL_COMMUNITY)
Admission: RE | Admit: 2013-10-08 | Discharge: 2013-10-08 | Disposition: A | Discharge status: Home / Self Care | Payer: Self-pay | Source: Ambulatory Visit | Attending: Internal Medicine | Admitting: Internal Medicine

## 2013-10-10 ENCOUNTER — Encounter (HOSPITAL_COMMUNITY): Payer: Self-pay | Admitting: Emergency Medicine

## 2013-10-10 ENCOUNTER — Encounter (HOSPITAL_COMMUNITY): Payer: Medicare Other | Attending: Internal Medicine

## 2013-10-10 ENCOUNTER — Emergency Department (HOSPITAL_COMMUNITY): Payer: Medicare Other

## 2013-10-10 ENCOUNTER — Inpatient Hospital Stay (HOSPITAL_COMMUNITY)
Admission: EM | Admit: 2013-10-10 | Discharge: 2013-10-15 | DRG: 871 | Disposition: A | Discharge status: Home Health | Payer: Medicare Other | Attending: Pulmonary Disease | Admitting: Pulmonary Disease

## 2013-10-10 DIAGNOSIS — Z923 Personal history of irradiation: Secondary | ICD-10-CM

## 2013-10-10 DIAGNOSIS — Z6824 Body mass index (BMI) 24.0-24.9, adult: Secondary | ICD-10-CM | POA: Diagnosis present

## 2013-10-10 DIAGNOSIS — I080 Rheumatic disorders of both mitral and aortic valves: Secondary | ICD-10-CM | POA: Diagnosis present

## 2013-10-10 DIAGNOSIS — J45909 Unspecified asthma, uncomplicated: Secondary | ICD-10-CM

## 2013-10-10 DIAGNOSIS — I5033 Acute on chronic diastolic (congestive) heart failure: Secondary | ICD-10-CM | POA: Diagnosis present

## 2013-10-10 DIAGNOSIS — M549 Dorsalgia, unspecified: Secondary | ICD-10-CM

## 2013-10-10 DIAGNOSIS — K219 Gastro-esophageal reflux disease without esophagitis: Secondary | ICD-10-CM

## 2013-10-10 DIAGNOSIS — Z6823 Body mass index (BMI) 23.0-23.9, adult: Secondary | ICD-10-CM | POA: Diagnosis present

## 2013-10-10 DIAGNOSIS — C20 Malignant neoplasm of rectum: Secondary | ICD-10-CM

## 2013-10-10 DIAGNOSIS — Z87891 Personal history of nicotine dependence: Secondary | ICD-10-CM

## 2013-10-10 DIAGNOSIS — L8990 Pressure ulcer of unspecified site, unspecified stage: Secondary | ICD-10-CM

## 2013-10-10 DIAGNOSIS — Z6822 Body mass index (BMI) 22.0-22.9, adult: Secondary | ICD-10-CM | POA: Diagnosis present

## 2013-10-10 DIAGNOSIS — I214 Non-ST elevation (NSTEMI) myocardial infarction: Secondary | ICD-10-CM | POA: Diagnosis present

## 2013-10-10 DIAGNOSIS — J45901 Unspecified asthma with (acute) exacerbation: Secondary | ICD-10-CM | POA: Diagnosis present

## 2013-10-10 DIAGNOSIS — J13 Pneumonia due to Streptococcus pneumoniae: Secondary | ICD-10-CM

## 2013-10-10 DIAGNOSIS — D539 Nutritional anemia, unspecified: Secondary | ICD-10-CM

## 2013-10-10 DIAGNOSIS — I350 Nonrheumatic aortic (valve) stenosis: Secondary | ICD-10-CM

## 2013-10-10 DIAGNOSIS — J189 Pneumonia, unspecified organism: Secondary | ICD-10-CM

## 2013-10-10 DIAGNOSIS — Z7982 Long term (current) use of aspirin: Secondary | ICD-10-CM

## 2013-10-10 DIAGNOSIS — N179 Acute kidney failure, unspecified: Secondary | ICD-10-CM

## 2013-10-10 DIAGNOSIS — N170 Acute kidney failure with tubular necrosis: Secondary | ICD-10-CM | POA: Diagnosis present

## 2013-10-10 DIAGNOSIS — J449 Chronic obstructive pulmonary disease, unspecified: Secondary | ICD-10-CM | POA: Diagnosis present

## 2013-10-10 DIAGNOSIS — R778 Other specified abnormalities of plasma proteins: Secondary | ICD-10-CM

## 2013-10-10 DIAGNOSIS — R6520 Severe sepsis without septic shock: Secondary | ICD-10-CM

## 2013-10-10 DIAGNOSIS — Z6825 Body mass index (BMI) 25.0-25.9, adult: Secondary | ICD-10-CM

## 2013-10-10 DIAGNOSIS — I369 Nonrheumatic tricuspid valve disorder, unspecified: Secondary | ICD-10-CM | POA: Insufficient documentation

## 2013-10-10 DIAGNOSIS — J9601 Acute respiratory failure with hypoxia: Secondary | ICD-10-CM | POA: Diagnosis present

## 2013-10-10 DIAGNOSIS — I10 Essential (primary) hypertension: Secondary | ICD-10-CM

## 2013-10-10 DIAGNOSIS — R7989 Other specified abnormal findings of blood chemistry: Secondary | ICD-10-CM

## 2013-10-10 DIAGNOSIS — J9620 Acute and chronic respiratory failure, unspecified whether with hypoxia or hypercapnia: Secondary | ICD-10-CM

## 2013-10-10 DIAGNOSIS — R6521 Severe sepsis with septic shock: Secondary | ICD-10-CM

## 2013-10-10 DIAGNOSIS — D509 Iron deficiency anemia, unspecified: Secondary | ICD-10-CM

## 2013-10-10 DIAGNOSIS — E663 Overweight: Secondary | ICD-10-CM | POA: Diagnosis present

## 2013-10-10 DIAGNOSIS — Z981 Arthrodesis status: Secondary | ICD-10-CM

## 2013-10-10 DIAGNOSIS — B3789 Other sites of candidiasis: Secondary | ICD-10-CM

## 2013-10-10 DIAGNOSIS — J441 Chronic obstructive pulmonary disease with (acute) exacerbation: Secondary | ICD-10-CM

## 2013-10-10 DIAGNOSIS — J309 Allergic rhinitis, unspecified: Secondary | ICD-10-CM

## 2013-10-10 DIAGNOSIS — R059 Cough, unspecified: Secondary | ICD-10-CM

## 2013-10-10 DIAGNOSIS — D696 Thrombocytopenia, unspecified: Secondary | ICD-10-CM | POA: Diagnosis not present

## 2013-10-10 DIAGNOSIS — Z952 Presence of prosthetic heart valve: Secondary | ICD-10-CM

## 2013-10-10 DIAGNOSIS — J984 Other disorders of lung: Secondary | ICD-10-CM

## 2013-10-10 DIAGNOSIS — R05 Cough: Secondary | ICD-10-CM

## 2013-10-10 DIAGNOSIS — I509 Heart failure, unspecified: Secondary | ICD-10-CM

## 2013-10-10 DIAGNOSIS — J4489 Other specified chronic obstructive pulmonary disease: Secondary | ICD-10-CM

## 2013-10-10 DIAGNOSIS — D649 Anemia, unspecified: Secondary | ICD-10-CM | POA: Diagnosis not present

## 2013-10-10 DIAGNOSIS — Z432 Encounter for attention to ileostomy: Secondary | ICD-10-CM

## 2013-10-10 DIAGNOSIS — R911 Solitary pulmonary nodule: Secondary | ICD-10-CM

## 2013-10-10 DIAGNOSIS — Z79899 Other long term (current) drug therapy: Secondary | ICD-10-CM

## 2013-10-10 DIAGNOSIS — E785 Hyperlipidemia, unspecified: Secondary | ICD-10-CM

## 2013-10-10 DIAGNOSIS — Z85048 Personal history of other malignant neoplasm of rectum, rectosigmoid junction, and anus: Secondary | ICD-10-CM

## 2013-10-10 DIAGNOSIS — L259 Unspecified contact dermatitis, unspecified cause: Secondary | ICD-10-CM

## 2013-10-10 DIAGNOSIS — A419 Sepsis, unspecified organism: Principal | ICD-10-CM

## 2013-10-10 DIAGNOSIS — R011 Cardiac murmur, unspecified: Secondary | ICD-10-CM

## 2013-10-10 DIAGNOSIS — I5032 Chronic diastolic (congestive) heart failure: Secondary | ICD-10-CM | POA: Diagnosis present

## 2013-10-10 HISTORY — DX: Presence of prosthetic heart valve: Z95.2

## 2013-10-10 LAB — CBC WITH DIFFERENTIAL/PLATELET
Basophils Absolute: 0 10*3/uL (ref 0.0–0.1)
Basophils Relative: 0 % (ref 0–1)
Eosinophils Absolute: 0 10*3/uL (ref 0.0–0.7)
Eosinophils Relative: 0 % (ref 0–5)
HCT: 45.5 % (ref 36.0–46.0)
Hemoglobin: 15.3 g/dL — ABNORMAL HIGH (ref 12.0–15.0)
Lymphocytes Relative: 3 % — ABNORMAL LOW (ref 12–46)
Lymphs Abs: 0.4 10*3/uL — ABNORMAL LOW (ref 0.7–4.0)
MCH: 31.6 pg (ref 26.0–34.0)
MCHC: 33.6 g/dL (ref 30.0–36.0)
MCV: 94 fL (ref 78.0–100.0)
Monocytes Absolute: 0.3 10*3/uL (ref 0.1–1.0)
Monocytes Relative: 2 % — ABNORMAL LOW (ref 3–12)
Neutro Abs: 13.3 10*3/uL — ABNORMAL HIGH (ref 1.7–7.7)
Neutrophils Relative %: 95 % — ABNORMAL HIGH (ref 43–77)
Platelets: 182 10*3/uL (ref 150–400)
RBC: 4.84 MIL/uL (ref 3.87–5.11)
RDW: 14.3 % (ref 11.5–15.5)
WBC: 14 10*3/uL — ABNORMAL HIGH (ref 4.0–10.5)

## 2013-10-10 LAB — POCT I-STAT, CHEM 8
BUN: 44 mg/dL — ABNORMAL HIGH (ref 6–23)
Calcium, Ion: 1.08 mmol/L — ABNORMAL LOW (ref 1.13–1.30)
Chloride: 103 mEq/L (ref 96–112)
Creatinine, Ser: 3.4 mg/dL — ABNORMAL HIGH (ref 0.50–1.10)
Glucose, Bld: 65 mg/dL — ABNORMAL LOW (ref 70–99)
HCT: 46 % (ref 36.0–46.0)
Hemoglobin: 15.6 g/dL — ABNORMAL HIGH (ref 12.0–15.0)
Potassium: 4.4 mEq/L (ref 3.7–5.3)
Sodium: 138 mEq/L (ref 137–147)
TCO2: 21 mmol/L (ref 0–100)

## 2013-10-10 LAB — D-DIMER, QUANTITATIVE (NOT AT ARMC): D-Dimer, Quant: 2.65 ug/mL-FEU — ABNORMAL HIGH (ref 0.00–0.48)

## 2013-10-10 LAB — COMPREHENSIVE METABOLIC PANEL
ALT: 12 U/L (ref 0–35)
AST: 24 U/L (ref 0–37)
Albumin: 3.1 g/dL — ABNORMAL LOW (ref 3.5–5.2)
Alkaline Phosphatase: 125 U/L — ABNORMAL HIGH (ref 39–117)
BUN: 46 mg/dL — ABNORMAL HIGH (ref 6–23)
CO2: 18 mEq/L — ABNORMAL LOW (ref 19–32)
Calcium: 9.5 mg/dL (ref 8.4–10.5)
Chloride: 97 mEq/L (ref 96–112)
Creatinine, Ser: 3.25 mg/dL — ABNORMAL HIGH (ref 0.50–1.10)
GFR calc Af Amer: 16 mL/min — ABNORMAL LOW (ref >90)
GFR calc non Af Amer: 14 mL/min — ABNORMAL LOW (ref >90)
Glucose, Bld: 51 mg/dL — ABNORMAL LOW (ref 70–99)
Potassium: 4.8 mEq/L (ref 3.7–5.3)
Sodium: 141 mEq/L (ref 137–147)
Total Bilirubin: 1.2 mg/dL (ref 0.3–1.2)
Total Protein: 7.3 g/dL (ref 6.0–8.3)

## 2013-10-10 LAB — POCT I-STAT 3, ART BLOOD GAS (G3+)
Acid-base deficit: 8 mmol/L — ABNORMAL HIGH (ref 0.0–2.0)
Bicarbonate: 16.3 mEq/L — ABNORMAL LOW (ref 20.0–24.0)
O2 Saturation: 95 %
TCO2: 17 mmol/L (ref 0–100)
pCO2 arterial: 29.9 mmHg — ABNORMAL LOW (ref 35.0–45.0)
pH, Arterial: 7.344 — ABNORMAL LOW (ref 7.350–7.450)
pO2, Arterial: 79 mmHg — ABNORMAL LOW (ref 80.0–100.0)

## 2013-10-10 LAB — CBC
HCT: 39.4 % (ref 36.0–46.0)
Hemoglobin: 13.3 g/dL (ref 12.0–15.0)
MCH: 31.1 pg (ref 26.0–34.0)
MCHC: 33.8 g/dL (ref 30.0–36.0)
MCV: 92.3 fL (ref 78.0–100.0)
Platelets: 135 10*3/uL — ABNORMAL LOW (ref 150–400)
RBC: 4.27 MIL/uL (ref 3.87–5.11)
RDW: 14.6 % (ref 11.5–15.5)
WBC: 12.1 10*3/uL — ABNORMAL HIGH (ref 4.0–10.5)

## 2013-10-10 LAB — PRO B NATRIURETIC PEPTIDE: Pro B Natriuretic peptide (BNP): >70000 pg/mL — ABNORMAL HIGH (ref 0–125)

## 2013-10-10 LAB — CREATININE, SERUM
Creatinine, Ser: 3.14 mg/dL — ABNORMAL HIGH (ref 0.50–1.10)
GFR calc Af Amer: 16 mL/min — ABNORMAL LOW (ref >90)
GFR calc non Af Amer: 14 mL/min — ABNORMAL LOW (ref >90)

## 2013-10-10 LAB — MRSA PCR SCREENING: MRSA by PCR: NEGATIVE

## 2013-10-10 LAB — TROPONIN I: Troponin I: 0.34 ng/mL (ref <0.30)

## 2013-10-10 MED ORDER — ASPIRIN 81 MG PO TABS
81.0000 mg | ORAL_TABLET | Freq: Every day | ORAL | Status: DC
Start: 2013-10-10 — End: 2013-10-10

## 2013-10-10 MED ORDER — SODIUM CHLORIDE 0.9 % IV BOLUS (SEPSIS)
1000.0000 mL | Freq: Once | INTRAVENOUS | Status: AC
  Administered 2013-10-10: 1000 mL via INTRAVENOUS

## 2013-10-10 MED ORDER — DILTIAZEM HCL ER COATED BEADS 120 MG PO CP24
120.0000 mg | ORAL_CAPSULE | Freq: Every day | ORAL | Status: DC
  Filled 2013-10-10 (×2): qty 1

## 2013-10-10 MED ORDER — METHYLPREDNISOLONE SODIUM SUCC 125 MG IJ SOLR
60.0000 mg | Freq: Two times a day (BID) | INTRAMUSCULAR | Status: DC
  Administered 2013-10-10 – 2013-10-11 (×2): 60 mg via INTRAVENOUS
  Filled 2013-10-10: qty 0.96
  Filled 2013-10-10: qty 2
  Filled 2013-10-10 (×3): qty 0.96

## 2013-10-10 MED ORDER — AZITHROMYCIN 500 MG IV SOLR
500.0000 mg | Freq: Once | INTRAVENOUS | Status: AC
  Administered 2013-10-10: 500 mg via INTRAVENOUS

## 2013-10-10 MED ORDER — IPRATROPIUM-ALBUTEROL 0.5-2.5 (3) MG/3ML IN SOLN
3.0000 mL | RESPIRATORY_TRACT | Status: DC
  Administered 2013-10-10 – 2013-10-12 (×10): 3 mL via RESPIRATORY_TRACT
  Filled 2013-10-10 (×8): qty 3
  Filled 2013-10-10: qty 39
  Filled 2013-10-10 (×2): qty 3

## 2013-10-10 MED ORDER — ALBUTEROL SULFATE (2.5 MG/3ML) 0.083% IN NEBU
5.0000 mg | INHALATION_SOLUTION | Freq: Once | RESPIRATORY_TRACT | Status: AC
  Administered 2013-10-10: 5 mg via RESPIRATORY_TRACT
  Filled 2013-10-10: qty 6

## 2013-10-10 MED ORDER — PHENYLEPHRINE HCL 10 MG/ML IJ SOLN
30.0000 ug/min | INTRAVENOUS | Status: DC
  Administered 2013-10-10: 30 ug/min via INTRAVENOUS
  Administered 2013-10-11 (×2): 40 ug/min via INTRAVENOUS
  Filled 2013-10-10 (×4): qty 1

## 2013-10-10 MED ORDER — IPRATROPIUM BROMIDE 0.02 % IN SOLN
0.5000 mg | Freq: Once | RESPIRATORY_TRACT | Status: AC
  Administered 2013-10-10: 0.5 mg via RESPIRATORY_TRACT
  Filled 2013-10-10: qty 2.5

## 2013-10-10 MED ORDER — DEXTROSE-NACL 5-0.45 % IV SOLN
INTRAVENOUS | Status: AC
  Administered 2013-10-10 – 2013-10-11 (×2): via INTRAVENOUS

## 2013-10-10 MED ORDER — HEPARIN SODIUM (PORCINE) 5000 UNIT/ML IJ SOLN
5000.0000 [IU] | Freq: Three times a day (TID) | INTRAMUSCULAR | Status: DC
  Filled 2013-10-10 (×2): qty 1

## 2013-10-10 MED ORDER — SIMVASTATIN 20 MG PO TABS
20.0000 mg | ORAL_TABLET | Freq: Every day | ORAL | Status: DC
  Filled 2013-10-10: qty 1

## 2013-10-10 MED ORDER — DEXTROSE 5 % IV SOLN
1.0000 g | Freq: Once | INTRAVENOUS | Status: AC
  Administered 2013-10-10: 1 g via INTRAVENOUS
  Filled 2013-10-10: qty 10

## 2013-10-10 MED ORDER — SODIUM CHLORIDE 0.9 % IV BOLUS (SEPSIS)
750.0000 mL | Freq: Once | INTRAVENOUS | Status: AC
  Administered 2013-10-10: 750 mL via INTRAVENOUS

## 2013-10-10 MED ORDER — ASPIRIN EC 81 MG PO TBEC
81.0000 mg | DELAYED_RELEASE_TABLET | Freq: Every day | ORAL | Status: DC
  Administered 2013-10-10 – 2013-10-15 (×6): 81 mg via ORAL
  Filled 2013-10-10 (×7): qty 1

## 2013-10-10 MED ORDER — METHYLPREDNISOLONE SODIUM SUCC 125 MG IJ SOLR
125.0000 mg | Freq: Once | INTRAMUSCULAR | Status: AC
  Administered 2013-10-10: 125 mg via INTRAVENOUS
  Filled 2013-10-10: qty 2

## 2013-10-10 MED ORDER — HEPARIN BOLUS VIA INFUSION
5000.0000 [IU] | Freq: Once | INTRAVENOUS | Status: AC
  Administered 2013-10-10: 5000 [IU] via INTRAVENOUS
  Filled 2013-10-10: qty 5000

## 2013-10-10 MED ORDER — DEXTROSE 5 % IV SOLN
500.0000 mg | INTRAVENOUS | Status: DC
  Administered 2013-10-11 – 2013-10-14 (×4): 500 mg via INTRAVENOUS
  Filled 2013-10-10 (×5): qty 500

## 2013-10-10 MED ORDER — DEXTROSE 5 % IV SOLN
1.0000 g | INTRAVENOUS | Status: DC
  Administered 2013-10-11 – 2013-10-14 (×4): 1 g via INTRAVENOUS
  Filled 2013-10-10 (×4): qty 10

## 2013-10-10 MED ORDER — HEPARIN (PORCINE) IN NACL 100-0.45 UNIT/ML-% IJ SOLN
1400.0000 [IU]/h | INTRAMUSCULAR | Status: DC
  Administered 2013-10-10 – 2013-10-11 (×2): 1400 [IU]/h via INTRAVENOUS
  Filled 2013-10-10 (×4): qty 250

## 2013-10-10 MED ORDER — PANTOPRAZOLE SODIUM 40 MG PO TBEC
40.0000 mg | DELAYED_RELEASE_TABLET | Freq: Every day | ORAL | Status: DC
Start: 2013-10-10 — End: 2013-10-15
  Administered 2013-10-11 – 2013-10-14 (×4): 40 mg via ORAL
  Filled 2013-10-10 (×4): qty 1

## 2013-10-10 MED ORDER — LOSARTAN POTASSIUM 50 MG PO TABS
50.0000 mg | ORAL_TABLET | Freq: Every day | ORAL | Status: DC
  Filled 2013-10-10 (×2): qty 1

## 2013-10-10 NOTE — ED Notes (Signed)
Pt reports 1 week hx of SOB, nasuea, vomiting, diarrhea.

## 2013-10-10 NOTE — Progress Notes (Signed)
eLink Physician-Brief Progress Note Patient Name: [name] DOB: [birth year] MRN: [id number]  Date of Service  10/10/2013   HPI/Events of Note  Persistent shock state despite volume   eICU Interventions  Started neo drip. May need CVL    Intervention Category Major Interventions: Shock - evaluation and management  Asencion Noble 10/10/2013, 10:04 PM

## 2013-10-10 NOTE — Consult Note (Signed)
Reason for Consult:  Abnormal troponin, and hx of aortic stenosis  Referring Physician: ER MD PCP: Adella Hare, MD Primary Cardiologist:Dr. Croitoru  [name] is an 68 y.o. female.    Chief Complaint:  3 day hx of SOB, nausea, vomiting a little diarrhea  HPI:   68 year old female with hx of rheumatic HF presents today with SOB, chills, prob. Fever but she did not take her temp.  She has had cough, N,V and some diarrhea.  She has not taken much food or liquid in and is not putting out much urine.  Her SP02 was 85% on room air.  She is in acute renal failure with of 3.40 up from 0.90 on Nov 20th of this year.    Her Pro BNP is elevated >70000, but she does have significant valve disease.    D Dimer is elevated.  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  Her most recent echocardiogram in September 2014 showed mild to moderate mitral insufficiency with mild mitral stenosis and moderate to severe aortic valve stenosis with moderate aortic insufficiency. The estimated aortic valve area was around 0.9 cm square and the mean gradient was 35 mm of mercury. Interestingly the aortic valve velocities actually appeared to have improved from her previous echocardiogram in March of 2013 when the mean aortic valve gradient was 51 mmHg. It was postulated that anemia might have been the cause for the higher gradients in 2013. Her hemoglobin has been as low as 9.6 secondary to iron deficiency which was found to be secondary to colon cancer. She subsequently underwent partial colectomy and is considered to be free of disease. She also has chronic lung disease and participates in pulmonary rehabilitation, under the care of Dr. [name]. Left ventricular systolic function has been normal. Echocardiography has shown severe calcification in the aortic and mitral valve annuli as well as in the  intervalvular fibrosa and Dr. Cyndia Bent suggested that a TEE would be helpful in planning her valve surgery.  Notwithstanding the confounding effects of anemia and lung disease, there appears to be a clear-cut pattern of worsening exertional dyspnea over the last 6 months or so. She has to rest before she can finish cleaning her living room. She describes occasional lightheadedness with activity but has not had any chest pain at rest or with exertion. She has occasional mild ankle swelling. She does not have complaints at rest and denies wheezing.  Additional comorbidities include treated hypertension and hyperlipidemia. She was recently hospitalized for a small bowel obstruction. This resolved spontaneously, without surgery. Repeat abdominal CT showing no evidence of cancer recurrence. She does not have atrial fibrillation and has a dilated left atrium.   Denies any chest pain.     Past Medical History  Diagnosis Date  . IBS (irritable bowel syndrome)   . Hyperlipidemia   . Allergic rhinitis   . History of pneumonia   . COPD (chronic obstructive pulmonary disease)   . Asthma   . Heart murmur     Aortic  . Lung nodule     RLL nodule-73m stable 2006, April 2009, and June 2009  . Candidiasis of breast   . Anemia   . Hyperlipemia   . Obesity   . Unspecified essential hypertension   . Heart valve problem     PER CARDIOLOGIST 05-03-2011  . Sleep  difficulties   . Hemorrhoids   . Sleep apnea     PT TOLD BORDERLINE-TRIED CPAP-DID NOT HELP-SHE DOES NOT USE CPAP  . Arthritis   . 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 History of radiation therapy 05/30/11 to 07/07/11    rectum  . Prophylactic chemotherapy     Will finish chemo on 03/04/12  . Pneumonia     hx of several times   . Shingles   . rectal ca 04/2011  . Severe aortic valve stenosis 10/01/2013    Past Surgical History  Procedure Laterality Date  . Foot surgery  left foot    hammer toe  . Hand surgery  Bil    carpal tunnel  . Back surgery       2006 SPACER---2007 LUMBAR FUSION  . Tonsillectomy    . Colon resection  08/25/2011    Procedure: COLON RESECTION LAPAROSCOPIC;  Surgeon: Stark Klein, MD;  Location: [facility];  Service: General;  Laterality: N/A;  Laparoscopic Assisted Low Anterior Resection Diverting Ostomy and onQ pain pump  . Ileostomy  08/25/2011  . Colon surgery    . Ileostomy closure  04/02/2012    Procedure: ILEOSTOMY TAKEDOWN;  Surgeon: Stark Klein, MD;  Location: [facility];  Service: General;  Laterality: N/A;  . Bowel resection  04/02/2012    Procedure: SMALL BOWEL RESECTION;  Surgeon: Stark Klein, MD;  Location: [facility];  Service: General;  Laterality: N/A;    Family History  Problem Relation Age of Onset  . Lung cancer Father   . Cancer Father     lung  . Diabetes    . Heart disease    . Heart disease Mother   . Diabetes Mother   . Hypertension Mother   . Colon cancer Neg Hx   . Esophageal cancer Neg Hx   . Stomach cancer Neg Hx   . Rectal cancer Neg Hx    Social History:  reports that she quit smoking about 38 years ago. Her smoking use included Cigarettes. She has a 15 pack-year smoking history. She has never used smokeless tobacco. She reports that she does not drink alcohol or use illicit drugs.  Allergies:  Allergies  Allergen Reactions  . Codeine Nausea Only  . Prednisone Hives    In different places all over the body.  . Sulfonamide Derivatives Nausea Only    OUTPATIENT MEDICATIONS: Albuterol inhaler Asa 81 mg daily Calcium 600 + D 1200 mg daily Diltiazem CD 120 mg daily Fluticasone furoate-vilanterol inhaler Losartan 50 mg at hs singulair 10 daily protonix 40 mg daily miralax daily spiriva daily  Results for orders placed during the hospital encounter of 10/10/13 (from the past 48 hour(s))  CBC WITH DIFFERENTIAL     Status: Abnormal   Collection Time    10/10/13 11:27 AM      Result Value Range   WBC 14.0 (*) 4.0 - 10.5 K/uL   RBC 4.84  3.87 - 5.11 MIL/uL   Hemoglobin 15.3  (*) 12.0 - 15.0 g/dL   HCT 45.5  36.0 - 46.0 %   MCV 94.0  78.0 - 100.0 fL   MCH 31.6  26.0 - 34.0 pg   MCHC 33.6  30.0 - 36.0 g/dL   RDW 14.3  11.5 - 15.5 %   Platelets 182  150 - 400 K/uL   Neutrophils Relative % 95 (*) 43 - 77 %   Lymphocytes Relative 3 (*) 12 - 46 %   Monocytes Relative 2 (*) 3 - 12 %  Eosinophils Relative 0  0 - 5 %   Basophils Relative 0  0 - 1 %   Neutro Abs 13.3 (*) 1.7 - 7.7 K/uL   Lymphs Abs 0.4 (*) 0.7 - 4.0 K/uL   Monocytes Absolute 0.3  0.1 - 1.0 K/uL   Eosinophils Absolute 0.0  0.0 - 0.7 K/uL   Basophils Absolute 0.0  0.0 - 0.1 K/uL   WBC Morphology TOXIC GRANULATION    COMPREHENSIVE METABOLIC PANEL     Status: Abnormal   Collection Time    10/10/13 11:27 AM      Result Value Range   Sodium 141  137 - 147 mEq/L   Comment: Please note change in reference range.   Potassium 4.8  3.7 - 5.3 mEq/L   Comment: Please note change in reference range.   Chloride 97  96 - 112 mEq/L   CO2 18 (*) 19 - 32 mEq/L   Glucose, Bld 51 (*) 70 - 99 mg/dL   BUN 46 (*) 6 - 23 mg/dL   Creatinine, Ser 3.25 (*) 0.50 - 1.10 mg/dL   Calcium 9.5  8.4 - 10.5 mg/dL   Total Protein 7.3  6.0 - 8.3 g/dL   Albumin 3.1 (*) 3.5 - 5.2 g/dL   AST 24  0 - 37 U/L   ALT 12  0 - 35 U/L   Alkaline Phosphatase 125 (*) 39 - 117 U/L   Total Bilirubin 1.2  0.3 - 1.2 mg/dL   GFR calc non Af Amer 14 (*) >90 mL/min   GFR calc Af Amer 16 (*) >90 mL/min   Comment: (NOTE)     The eGFR has been calculated using the CKD EPI equation.     This calculation has not been validated in all clinical situations.     eGFR's persistently <90 mL/min signify possible Chronic Kidney     Disease.  TROPONIN I     Status: Abnormal   Collection Time    10/10/13  1:12 PM      Result Value Range   Troponin I 0.34 (*) <0.30 ng/mL   Comment:            Due to the release kinetics of cTnI,     a negative result within the first hours     of the onset of symptoms does not rule out     myocardial infarction  with certainty.     If myocardial infarction is still suspected,     repeat the test at appropriate intervals.     CRITICAL RESULT CALLED TO, READ BACK BY AND VERIFIED WITH:     B.CHANDLER,RN 10/10/13 1413 BY BSLADE  PRO B NATRIURETIC PEPTIDE     Status: Abnormal   Collection Time    10/10/13  1:12 PM      Result Value Range   Pro B Natriuretic peptide (BNP) >70000.0 (*) 0 - 125 pg/mL  D-DIMER, QUANTITATIVE     Status: Abnormal   Collection Time    10/10/13  1:12 PM      Result Value Range   D-Dimer, Quant 2.65 (*) 0.00 - 0.48 ug/mL-FEU   Comment:            AT THE INHOUSE ESTABLISHED CUTOFF     VALUE OF 0.48 ug/mL FEU,     THIS ASSAY HAS BEEN DOCUMENTED     IN THE LITERATURE TO HAVE     A SENSITIVITY AND NEGATIVE     PREDICTIVE VALUE OF AT LEAST  98 TO 99%.  THE TEST RESULT     SHOULD BE CORRELATED WITH     AN ASSESSMENT OF THE CLINICAL     PROBABILITY OF DVT / VTE.  POCT I-STAT, CHEM 8     Status: Abnormal   Collection Time    10/10/13  1:32 PM      Result Value Range   Sodium 138  137 - 147 mEq/L   Potassium 4.4  3.7 - 5.3 mEq/L   Chloride 103  96 - 112 mEq/L   BUN 44 (*) 6 - 23 mg/dL   Creatinine, Ser 3.40 (*) 0.50 - 1.10 mg/dL   Glucose, Bld 65 (*) 70 - 99 mg/dL   Calcium, Ion 1.08 (*) 1.13 - 1.30 mmol/L   TCO2 21  0 - 100 mmol/L   Hemoglobin 15.6 (*) 12.0 - 15.0 g/dL   HCT 46.0  36.0 - 46.0 %   Dg Chest Portable 1 View  10/10/2013   CLINICAL DATA:  Shortness of breath and emesis.  EXAM: PORTABLE CHEST - 1 VIEW  COMPARISON:  11/15/2012  FINDINGS: Patient is rotated to the left. Lungs are adequately inflated and demonstrate moderate consolidation over the right base which may be due to 8 pneumonia versus layering pleural fluid with atelectasis. Mild stable cardiomegaly. Remainder the exam is unchanged.  IMPRESSION: Moderate opacification over the right mid to lower lung which may be due to a pneumonia versus layering pleural effusion/atelectasis.  Stable cardiomegaly.    Electronically Signed   By: Marin Olp [name]   On: 10/10/2013 12:38    ROS: General:+ cold, with N/V/D cough chills and fevers Skin:no rashes or ulcers HEENT:no blurred vision, no congestion CV:see HPI PUL:see HPI RN:HAFB diarrhea no constipation no melena, no indigestion GU:no hematuria, no dysuria, decreased urine output MS:no joint pain, no claudication Neuro:no syncope, no lightheadedness Endo:no diabetes, no thyroid disease   Blood pressure 98/72, pulse 124, temperature 97.9 F (36.6 C), temperature source Oral, resp. rate 30, SpO2 97.00%. PE: General:Pleasant affect, NAD, obviously ill, sitting on side of bed to breathe better Skin:Warm to cool and dry, brisk capillary refill HEENT:normocephalic, sclera clear, mucus membranes moist Neck:supple, no JVD  XUXYB:F3O3 RRR with systolic murmur, no gallup, rub or click Lungs: Lt lung clear, rt without rales, but + rhonchi, occ wheeze ANV:BTYO, non tender, + BS, do not palpate liver spleen or masses Ext:no lower ext edema, 1+ pedal pulses, 2+ radial pulses Neuro:alert and oriented, MAE, follows commands, + facial symmetry    Assessment/Plan Principal Problem:   PNA (pneumonia) Active Problems:   CHRONIC OBSTRUCTIVE PULMONARY DISEASE, ACUTE EXACERBATION   COPD   Severe aortic valve stenosis   CAP (community acquired pneumonia)   CHF, acute on chronic   Acute renal failure   Acute-on-chronic respiratory failure  PLAN: hold BP meds and hydrate gently, treat PNA and with her COPD monitor for increasing respiratory failure.  BiPAP in place.  See Dr. Lysbeth Penner note.  [place]  Nurse Practitioner Certified [place] Pager [phone] or after 5pm or weekends call [phone] 10/10/2013, 4:19 PM

## 2013-10-10 NOTE — Consult Note (Signed)
Pt. Seen and examined. Agree with the NP/PA-C note as written.  Pleasant 68 yo female with rheumatic heart disease and likely severe aortic stenosis and moderate mitral stenosis. She was just seen by my partner Dr. Loletha Grayer on 12/23 for increasing shortness of breath and he recommended a left and right heart catheterization in January.  Several days after that visit, she developed subjective fever, chills, nausea, vomiting and diarrhea, she has become weak and increasing dyspneic.  On admission she was markedly hypoxic (85% RA) and found to be in acute renal failure (Cr 3.4, baseline 0.9.  BNP >70K, troponin mildly positive at 0.34 and she had an elevated D-dimer of 2.64.  There is leukocytosis of 14K with a left-shift (95% segs) - CXR shows probable RLL PNA. On exam she was on bipap, but appeared uncomfortable with increased WOB. Regular sinus tachycardia - distant late-peaking systolic murmur at RUSB. Obese, no JVD or LE edema is appreciated. Lungs notable for dullness to percussion in the right base with overlying crackles, essentially clear on the left except mild crackles at the left base.  Impression/Plan: 1.  Probable pneumonia / SIRS syndrome - consider influenza, antibiotics - possible tamiflu per PCCM 2.  AKI, likely pre-renal or ATN, recommend volume challenge - may need nephrology involvement 3.  Severe aortic stenosis and moderate mitral stenosis, at risk for endocarditis if bacteremic - recommend repeat echocardiogram to evaluate for obvious vegetation. 4.  Elevated troponin, suspect Type II mechanism or related to AKI/sepsis/heart failure, less likely plaque rupture, could also be due to ?PE - would recommend heparinization 5.  Elevated d-dimer - most likely due to infection, but need to r/o DVT and/or PE - recommend LE dopplers and V/Q scan at some point. Heparin, as mentioned above. 6.  Hypotension/tachycardia - suspect, again this is SIRS/possible sepsis. Recommend volume, antibiotics, central  access per PCCM.  We will follow along closely with you on this critically ill patient.  CRITICAL CARE:  The patient is critically ill with multi-organ system failure and requires high complexity decision making for assessment and support, frequent evaluation and titration of therapies, application of advanced monitoring technologies and extensive interpretation of multiple databases.  TOTAL TIME SPENT WITH PATIENT (MD and NP): 30 minutes  Pixie Casino, MD, [facility] Attending Cardiologist [facility]

## 2013-10-10 NOTE — Progress Notes (Signed)
Pt removed from BIPAP and placed on 4L [place] per MD order to titrate from BiPap. Sats remain 95-97%. Clear/Dim BBS. Pt tolerating titration well at this time. Pt states no difficulty breathing/SOB. RN aware. RT will continue to monitor. BiPap remains at bedside.

## 2013-10-10 NOTE — H&P (Signed)
Name: [name] MRN: [id number] DOB: [birth year]    ADMISSION DATE:  10/10/2013 CONSULTATION DATE:  10/10/2013  REFERRING MD :  EDP PRIMARY SERVICE: PCCM  CHIEF COMPLAINT:  Shortness of breath  BRIEF PATIENT DESCRIPTION: 68 y/o female with rheumatic valve disease and COPD was admitted on 1/1 with acute respiratory failure from severe CAP requiring BIPAP.  SIGNIFICANT EVENTS / STUDIES:  1/1 admission  LINES / TUBES:   CULTURES: 1/1 blood >> 1/1 ur strep >> 1/1 ur leg >>  ANTIBIOTICS: 1/1 ceftriaxone >> 1/1 azithro >>  HISTORY OF PRESENT ILLNESS:  68 y/o female with rheumatic valve disease and COPD was admitted on 1/1 with acute respiratory failure from severe CAP requiring BIPAP.  She stated that she had a non-productive cough associated with chills at home several days prior to admission. This was associated with nausea and vomiting.  She presented to the [facility] ED on 1/1 for worsening dyspnea and was found to have a RLL infiltrate.  She denied leg swelling, dietary indiscretion, or non-compliance with medications.  No chest pain.  PAST MEDICAL HISTORY :  Past Medical History  Diagnosis Date  . IBS (irritable bowel syndrome)   . Hyperlipidemia   . Allergic rhinitis   . History of pneumonia   . COPD (chronic obstructive pulmonary disease)   . Asthma   . Heart murmur     Aortic  . Lung nodule     RLL nodule-30mm stable 2006, April 2009, and June 2009  . Candidiasis of breast   . Anemia   . Hyperlipemia   . Obesity   . Unspecified essential hypertension   . Heart valve problem     PER CARDIOLOGIST 05-03-2011  . Sleep difficulties   . Hemorrhoids   . Sleep apnea     PT TOLD BORDERLINE-TRIED CPAP-DID NOT HELP-SHE DOES NOT USE CPAP  . Arthritis   . 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 History of radiation therapy 05/30/11 to 07/07/11    rectum  . Prophylactic chemotherapy     Will finish chemo on 03/04/12  . Pneumonia     hx of several times   . Shingles   .  rectal ca 04/2011  . Severe aortic valve stenosis 10/01/2013   Past Surgical History  Procedure Laterality Date  . Foot surgery  left foot    hammer toe  . Hand surgery  Bil    carpal tunnel  . Back surgery      2006 SPACER---2007 LUMBAR FUSION  . Tonsillectomy    . Colon resection  08/25/2011    Procedure: COLON RESECTION LAPAROSCOPIC;  Surgeon: Stark Klein, MD;  Location: [facility];  Service: General;  Laterality: N/A;  Laparoscopic Assisted Low Anterior Resection Diverting Ostomy and onQ pain pump  . Ileostomy  08/25/2011  . Colon surgery    . Ileostomy closure  04/02/2012    Procedure: ILEOSTOMY TAKEDOWN;  Surgeon: Stark Klein, MD;  Location: [facility];  Service: General;  Laterality: N/A;  . Bowel resection  04/02/2012    Procedure: SMALL BOWEL RESECTION;  Surgeon: Stark Klein, MD;  Location: [facility];  Service: General;  Laterality: N/A;   Prior to Admission medications   Medication Sig Start Date End Date Taking? Authorizing Provider  albuterol (VENTOLIN HFA) 108 (90 BASE) MCG/ACT inhaler Inhale 2 puffs into the lungs every 6 (six) hours as needed for wheezing. 01/24/12 05/03/14 Yes Brand Males, MD  aspirin 81 MG tablet Take 81 mg by mouth at bedtime.  Yes Historical Provider, MD  Calcium Carbonate-Vitamin D (CALCIUM 600 + D PO) Take 1,200 mg by mouth at bedtime.    Yes Historical Provider, MD  diltiazem (CARDIZEM CD) 120 MG 24 hr capsule Take 120 mg by mouth at bedtime.    Yes Historical Provider, MD  Fluticasone Furoate-Vilanterol (BREO ELLIPTA) 100-25 MCG/INH AEPB Inhale 1 puff into the lungs daily. 07/15/13  Yes Brand Males, MD  losartan (COZAAR) 50 MG tablet 50 mg at bedtime.  09/19/11  Yes Historical Provider, MD  lovastatin (MEVACOR) 40 MG tablet Take 40 mg by mouth at bedtime.   Yes Historical Provider, MD  montelukast (SINGULAIR) 10 MG tablet Take 10 mg by mouth at bedtime.   Yes Historical Provider, MD  pantoprazole (PROTONIX) 40 MG tablet Take 40 mg by mouth at  bedtime.  05/03/11  Yes Historical Provider, MD  polyethylene glycol (MIRALAX / GLYCOLAX) packet Take 17 g by mouth daily.   Yes Historical Provider, MD  tiotropium (SPIRIVA) 18 MCG inhalation capsule Place 18 mcg into inhaler and inhale daily.   Yes Historical Provider, MD   Allergies  Allergen Reactions  . Codeine Nausea Only  . Prednisone Hives    In different places all over the body.  . Sulfonamide Derivatives Nausea Only    FAMILY HISTORY:  Family History  Problem Relation Age of Onset  . Lung cancer Father   . Cancer Father     lung  . Diabetes    . Heart disease    . Heart disease Mother   . Diabetes Mother   . Hypertension Mother   . Colon cancer Neg Hx   . Esophageal cancer Neg Hx   . Stomach cancer Neg Hx   . Rectal cancer Neg Hx    SOCIAL HISTORY:  reports that she quit smoking about 38 years ago. Her smoking use included Cigarettes. She has a 15 pack-year smoking history. She has never used smokeless tobacco. She reports that she does not drink alcohol or use illicit drugs.  REVIEW OF SYSTEMS:  Cannot obtain due to BIPAP, difficult to communicate  SUBJECTIVE:   VITAL SIGNS: Temp:  [97.9 F (36.6 C)] 97.9 F (36.6 C) (01/01 1114) Pulse Rate:  [122-131] 124 (01/01 1628) Resp:  [26-37] 33 (01/01 1628) BP: (85-110)/(36-73) 93/58 mmHg (01/01 1628) SpO2:  [85 %-99 %] 94 % (01/01 1628) HEMODYNAMICS:   VENTILATOR SETTINGS:   INTAKE / OUTPUT: Intake/Output   None     PHYSICAL EXAMINATION: Gen: short of breath on BIPAP HEENT: NCAT, EOMi, OP clear, BIPAP mask in place PULM: coarse crackles, egophany, tubular breath sounds R base, wheezing elsewhere CV: RRR, systolic murmur noted, no JVD AB: BS+, soft, nontender, no hsm Ext: warm, no edema, no clubbing, no cyanosis Derm: no rash or skin breakdown Neuro: A&Ox4, CN II-XII intact, strength 5/5 in all 4 extremities    LABS:  CBC  Recent Labs Lab 10/10/13 1127 10/10/13 1332  WBC 14.0*  --   HGB  15.3* 15.6*  HCT 45.5 46.0  PLT 182  --    Coag's No results found for this basename: APTT, INR,  in the last 168 hours BMET  Recent Labs Lab 10/10/13 1127 10/10/13 1332  NA 141 138  K 4.8 4.4  CL 97 103  CO2 18*  --   BUN 46* 44*  CREATININE 3.25* 3.40*  GLUCOSE 51* 65*   Electrolytes  Recent Labs Lab 10/10/13 1127  CALCIUM 9.5   Sepsis Markers No results found for  this basename: LATICACIDVEN, PROCALCITON, O2SATVEN,  in the last 168 hours ABG No results found for this basename: PHART, PCO2ART, PO2ART,  in the last 168 hours Liver Enzymes  Recent Labs Lab 10/10/13 1127  AST 24  ALT 12  ALKPHOS 125*  BILITOT 1.2  ALBUMIN 3.1*   Cardiac Enzymes  Recent Labs Lab 10/10/13 1312  TROPONINI 0.34*  PROBNP >70000.0*   Glucose No results found for this basename: GLUCAP,  in the last 168 hours  Imaging Dg Chest Portable 1 View  10/10/2013   CLINICAL DATA:  Shortness of breath and emesis.  EXAM: PORTABLE CHEST - 1 VIEW  COMPARISON:  11/15/2012  FINDINGS: Patient is rotated to the left. Lungs are adequately inflated and demonstrate moderate consolidation over the right base which may be due to 8 pneumonia versus layering pleural fluid with atelectasis. Mild stable cardiomegaly. Remainder the exam is unchanged.  IMPRESSION: Moderate opacification over the right mid to lower lung which may be due to a pneumonia versus layering pleural effusion/atelectasis.  Stable cardiomegaly.   Electronically Signed   By: Marin Olp [name]   On: 10/10/2013 12:38     CXR: RLL infiltrate and ? effusion  ASSESSMENT / PLAN:  PULMONARY A: Severe CAP with possible RLL effusion Acute respiratory failure from pneumonia and possibly asthma exac P:   -BIPAP prn -low threshold for intubation, watch closely -solumedrol -scheduled bronchodilators -ultrasound chest on 1/2 AM to look for effusion  CARDIOVASCULAR A: Severe Rheumatic heart disease with MS and AS; I agree with Dr. Debara Pickett  that she appears volume deplete right now despite the pro-BNP value NSTEMI, likely demand P:  -f/u cardiology recs -gentle hydration -heparin per pharm -echo -may need CVL for CVP  RENAL A:  AKI > presumably pre-renal or ATN P:   -gentle IVF -monitor UOP -serial BMET  GASTROINTESTINAL A:  Nausea > resolved P:   -NPO on BIPAP -anti-emetic as needed  HEMATOLOGIC A:  No acute issues P:  -Monitor for bleeding  INFECTIOUS A:  Severe CAP P:   -continue ceftriaxone and azithro for now -f/u cultures, urine biomarkers -send flu test  ENDOCRINE A:  No acute issues   P:   -monitor glucose  NEUROLOGIC A:  No acute issues P:    Husband updated at bedside   I have personally obtained a history, examined the patient, evaluated laboratory and imaging results, formulated the assessment and plan and placed orders. CRITICAL CARE: The patient is critically ill with multiple organ systems failure and requires high complexity decision making for assessment and support, frequent evaluation and titration of therapies, application of advanced monitoring technologies and extensive interpretation of multiple databases. Critical Care Time devoted to patient care services described in this note is 45 minutes.   Jillyn Hidden PCCM Pager: [phone] Cell: [phone] If no response, call [phone]   10/10/2013, 4:33 PM

## 2013-10-10 NOTE — ED Notes (Signed)
Pt ra SAT 85 %. Resp labored. Pt holding inhaler.

## 2013-10-10 NOTE — Progress Notes (Signed)
eLink Physician-Brief Progress Note Patient Name: [name] DOB: 1945/11/24 MRN: [id number]  Date of Service  10/10/2013   HPI/Events of Note  Pt with CAP, CHF, ARF. Severe AS    eICU Interventions  Plan admit to icu See orders   Intervention Category Major Interventions: Respiratory failure - evaluation and management;Sepsis - evaluation and management  Asencion Noble 10/10/2013, 3:53 PM

## 2013-10-10 NOTE — Progress Notes (Addendum)
ANTICOAGULATION CONSULT NOTE - Initial Consult  Pharmacy Consult for heparin gtt Indication: ACS/STEMI (and r/o DVT and/or PE per Cardiology note)  Allergies  Allergen Reactions  . Codeine Nausea Only  . Prednisone Hives    In different places all over the body.  . Sulfonamide Derivatives Nausea Only    Patient Measurements: Height: 5' 8.11" (173 cm) Weight: 227 lb 4.7 oz (103.1 kg) IBW/kg (Calculated) : 64.15 Heparin Dosing Weight: 87 kg  Vital Signs: Temp: 97.9 F (36.6 C) (01/01 1114) Temp src: Oral (01/01 1114) BP: 95/55 mmHg (01/01 1645) Pulse Rate: 124 (01/01 1645)  Labs:  Recent Labs  10/10/13 1127 10/10/13 1312 10/10/13 1332  HGB 15.3*  --  15.6*  HCT 45.5  --  46.0  PLT 182  --   --   CREATININE 3.25*  --  3.40*  TROPONINI  --  0.34*  --     Estimated Creatinine Clearance: 20 ml/min (by C-G formula based on Cr of 3.4).   Medical History: Past Medical History  Diagnosis Date  . IBS (irritable bowel syndrome)   . Hyperlipidemia   . Allergic rhinitis   . History of pneumonia   . COPD (chronic obstructive pulmonary disease)   . Asthma   . Heart murmur     Aortic  . Lung nodule     RLL nodule-72mm stable 2006, April 2009, and June 2009  . Candidiasis of breast   . Anemia   . Hyperlipemia   . Obesity   . Unspecified essential hypertension   . Heart valve problem     PER CARDIOLOGIST 05-03-2011  . Sleep difficulties   . Hemorrhoids   . Sleep apnea     PT TOLD BORDERLINE-TRIED CPAP-DID NOT HELP-SHE DOES NOT USE CPAP  . Arthritis   . 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 History of radiation therapy 05/30/11 to 07/07/11    rectum  . Prophylactic chemotherapy     Will finish chemo on 03/04/12  . Pneumonia     hx of several times   . Shingles   . rectal ca 04/2011  . Severe aortic valve stenosis 10/01/2013    Assessment: 68 yo F with rheumatic heart disease and likely severe aortic stenosis and moderate mitral stenosis presenting to emergency  department with 3 days history of SOB, n/v, chills, and some diarrhea.  Patient has elevated troponin and d-dimer.  Per Cardiology, this is most likely d/t infection but need to r/o DVT and/or PE.  Pharmacy consulted to start heparin gtt for ACS/STEMI, will take into account cardiology note and concerns for DVT/PE.  Patient reports not on any anticoagulants PTA and no recent history of bleeding.  Hgb is slightly elevated and plt are wnl.  She currently has AKI with SCr of 3.4 (BL of 0.9 last November).    Goal of Therapy:  Heparin level 0.3-0.7 units/ml Monitor platelets by anticoagulation protocol: Yes   Plan:  - give heparin IV bolus of 5000 units, followed by a rate of 1400 units/hr - draw 8h HL - daily HL and CBC - monitor for s/s of bleeding  [name], PharmD Clinical Pharmacist - Resident Pager: [phone] Pharmacy: [phone] 10/10/2013 5:26 PM    Addendum: Pharmacy also consulted to dose Ceftriaxone for CAP. Patient received Ceftriaxone 1g ~1300 in ED today. Patient is also receiving Azithromycin.  Plan 1. Ceftriaxone 1g IV q24h 2. No dosing adjustments needed for Ceftriaxone - pharmacy will sign-off of dosing Ceftriaxone only.   [name], PharmD  Clinical Pharmacist [phone] 10/10/2013, 6:27 PM

## 2013-10-10 NOTE — ED Provider Notes (Signed)
CSN: [id number]     Arrival date & time 10/10/13  1107 History   First MD Initiated Contact with Patient 10/10/13 1127     Chief Complaint  Patient presents with  . Shortness of Breath  . Emesis   (Consider location/radiation/quality/duration/timing/severity/associated sxs/prior Treatment) HPI A LEVEL 5 CAVEAT PERTAINS DUE TO URGENT NEED FOR INTERVENTION.  Pt presents with c/o shortness of breath, generalized weakness which has been worsening over the past several days.  Husband also states she has had a couple of episodes of emesis.  Emesis has been nonbloody and nonbilious.  Also c/o several episodes of diarrhea- no blood.  Cough has been productive.  She tried her albuterol inhaler this morning without much relief.   Past Medical History  Diagnosis Date  . IBS (irritable bowel syndrome)   . Hyperlipidemia   . Allergic rhinitis   . History of pneumonia   . COPD (chronic obstructive pulmonary disease)   . Asthma   . Heart murmur     Aortic  . Lung nodule     RLL nodule-65mm stable 2006, April 2009, and June 2009  . Candidiasis of breast   . Anemia   . Hyperlipemia   . Obesity   . Unspecified essential hypertension   . Heart valve problem     PER CARDIOLOGIST 05-03-2011  . Sleep difficulties   . Hemorrhoids   . Sleep apnea     PT TOLD BORDERLINE-TRIED CPAP-DID NOT HELP-SHE DOES NOT USE CPAP  . Arthritis   . 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 History of radiation therapy 05/30/11 to 07/07/11    rectum  . Prophylactic chemotherapy     Will finish chemo on 03/04/12  . Pneumonia     hx of several times   . Shingles   . rectal ca 04/2011  . Severe aortic valve stenosis 10/01/2013  . Shortness of breath    Past Surgical History  Procedure Laterality Date  . Foot surgery  left foot    hammer toe  . Hand surgery  Bil    carpal tunnel  . Back surgery      2006 SPACER---2007 LUMBAR FUSION  . Tonsillectomy    . Colon resection  08/25/2011    Procedure: COLON RESECTION LAPAROSCOPIC;   Surgeon: Stark Klein, MD;  Location: [facility];  Service: General;  Laterality: N/A;  Laparoscopic Assisted Low Anterior Resection Diverting Ostomy and onQ pain pump  . Ileostomy  08/25/2011  . Colon surgery    . Ileostomy closure  04/02/2012    Procedure: ILEOSTOMY TAKEDOWN;  Surgeon: Stark Klein, MD;  Location: [facility];  Service: General;  Laterality: N/A;  . Bowel resection  04/02/2012    Procedure: SMALL BOWEL RESECTION;  Surgeon: Stark Klein, MD;  Location: [facility];  Service: General;  Laterality: N/A;  . Carpal tunnel release     Family History  Problem Relation Age of Onset  . Lung cancer Father   . Cancer Father     lung  . Diabetes    . Heart disease    . Heart disease Mother   . Diabetes Mother   . Hypertension Mother   . Colon cancer Neg Hx   . Esophageal cancer Neg Hx   . Stomach cancer Neg Hx   . Rectal cancer Neg Hx    History  Substance Use Topics  . Smoking status: Former Smoker -- 1.50 packs/day for 10 years    Types: Cigarettes    Quit date: 12/27/1974  .  Smokeless tobacco: Never Used  . Alcohol Use: No     Comment: rare   OB History   Grav Para Term Preterm Abortions TAB SAB Ect Mult Living                 Review of Systems UNABLE TO OBTAIN ROS DUE TO LEVEL 5 CAVEAT  Allergies  Codeine; Prednisone; and Sulfonamide derivatives  Home Medications   No current outpatient prescriptions on file. BP 122/60  Pulse 82  Temp(Src) 97.7 F (36.5 C) (Oral)  Resp 22  Ht 5' 8.11" (1.73 m)  Wt 227 lb 4.7 oz (103.1 kg)  BMI 34.45 kg/m2  SpO2 99% Vitals reviewed Physical Exam Physical Examination: General appearance - alert, ill and tired appearing, and in no distress Mental status - alert, oriented to person, place, and time Eyes - no conjunctival injection, no scleral icterus Mouth - mucous membranes moist, pharynx normal without lesions Chest - diffuse scattered rhonchi and mild wheezing, BSS, increased respiratory effort Heart - normal rate, regular  rhythm, normal S1, S2, no murmurs, rubs, clicks or gallops Abdomen - soft, nontender, nondistended, no masses or organomegaly Extremities - peripheral pulses normal, no pedal edema, no clubbing or cyanosis Skin - normal coloration and turgor, no rashes  ED Course  Procedures (including critical care time)  3:36 PM d/w cardiology on call for Dr. Royann Shivers, they will see patient in consultation.  Will also call critical care.   CRITICAL CARE Performed by: Ethelda Chick Total critical care time: 45 Critical care time was exclusive of separately billable procedures and treating other patients. Critical care was necessary to treat or prevent imminent or life-threatening deterioration. Critical care was time spent personally by me on the following activities: development of treatment plan with patient and/or surrogate as well as nursing, discussions with consultants, evaluation of patient's response to treatment, examination of patient, obtaining history from patient or surrogate, ordering and performing treatments and interventions, ordering and review of laboratory studies, ordering and review of radiographic studies, pulse oximetry and re-evaluation of patient's condition.  3:50 PM d/w Dr. Delford Field, PCCM they will see patient in the ED.   Labs Review Labs Reviewed  CBC WITH DIFFERENTIAL - Abnormal; Notable for the following:    WBC 14.0 (*)    Hemoglobin 15.3 (*)    Neutrophils Relative % 95 (*)    Lymphocytes Relative 3 (*)    Monocytes Relative 2 (*)    Neutro Abs 13.3 (*)    Lymphs Abs 0.4 (*)    All other components within normal limits  COMPREHENSIVE METABOLIC PANEL - Abnormal; Notable for the following:    CO2 18 (*)    Glucose, Bld 51 (*)    BUN 46 (*)    Creatinine, Ser 3.25 (*)    Albumin 3.1 (*)    Alkaline Phosphatase 125 (*)    GFR calc non Af Amer 14 (*)    GFR calc Af Amer 16 (*)    All other components within normal limits  TROPONIN I - Abnormal; Notable for the  following:    Troponin I 0.34 (*)    All other components within normal limits  PRO B NATRIURETIC PEPTIDE - Abnormal; Notable for the following:    Pro B Natriuretic peptide (BNP) >70000.0 (*)    All other components within normal limits  D-DIMER, QUANTITATIVE - Abnormal; Notable for the following:    D-Dimer, Quant 2.65 (*)    All other components within normal limits  CBC -  Abnormal; Notable for the following:    WBC 18.1 (*)    All other components within normal limits  CBC - Abnormal; Notable for the following:    WBC 12.1 (*)    Platelets 135 (*)    All other components within normal limits  CREATININE, SERUM - Abnormal; Notable for the following:    Creatinine, Ser 3.14 (*)    GFR calc non Af Amer 14 (*)    GFR calc Af Amer 16 (*)    All other components within normal limits  BASIC METABOLIC PANEL - Abnormal; Notable for the following:    Glucose, Bld 159 (*)    BUN 63 (*)    Creatinine, Ser 2.93 (*)    Calcium 8.0 (*)    GFR calc non Af Amer 15 (*)    GFR calc Af Amer 18 (*)    All other components within normal limits  STREP PNEUMONIAE URINARY ANTIGEN - Abnormal; Notable for the following:    Strep Pneumo Urinary Antigen POSITIVE (*)    All other components within normal limits  HEPARIN LEVEL (UNFRACTIONATED) - Abnormal; Notable for the following:    Heparin Unfractionated <0.10 (*)    All other components within normal limits  TROPONIN I - Abnormal; Notable for the following:    Troponin I 0.53 (*)    All other components within normal limits  CK TOTAL AND CKMB - Abnormal; Notable for the following:    CK, MB 4.6 (*)    Relative Index 4.1 (*)    All other components within normal limits  CBC - Abnormal; Notable for the following:    RBC 3.53 (*)    Hemoglobin 11.1 (*)    HCT 32.4 (*)    All other components within normal limits  PRO B NATRIURETIC PEPTIDE - Abnormal; Notable for the following:    Pro B Natriuretic peptide (BNP) 8290.0 (*)    All other  components within normal limits  BASIC METABOLIC PANEL - Abnormal; Notable for the following:    Sodium 136 (*)    Glucose, Bld 106 (*)    BUN 65 (*)    Creatinine, Ser 1.81 (*)    GFR calc non Af Amer 28 (*)    GFR calc Af Amer 32 (*)    All other components within normal limits  TROPONIN I - Abnormal; Notable for the following:    Troponin I 0.45 (*)    All other components within normal limits  POCT I-STAT, CHEM 8 - Abnormal; Notable for the following:    BUN 44 (*)    Creatinine, Ser 3.40 (*)    Glucose, Bld 65 (*)    Calcium, Ion 1.08 (*)    Hemoglobin 15.6 (*)    All other components within normal limits  POCT I-STAT 3, BLOOD GAS (G3+) - Abnormal; Notable for the following:    pH, Arterial 7.344 (*)    pCO2 arterial 29.9 (*)    pO2, Arterial 79.0 (*)    Bicarbonate 16.3 (*)    Acid-base deficit 8.0 (*)    All other components within normal limits  RESPIRATORY VIRUS PANEL  MRSA PCR SCREENING  CULTURE, BLOOD (ROUTINE X 2)  CULTURE, BLOOD (ROUTINE X 2)  INFLUENZA PANEL BY PCR  LEGIONELLA ANTIGEN, URINE  HEPARIN LEVEL (UNFRACTIONATED)  HEPARIN LEVEL (UNFRACTIONATED)   Imaging Review Dg Chest Port 1 View  10/12/2013   CLINICAL DATA:  Followup right lower lobe pneumonia  EXAM: PORTABLE CHEST - 1 VIEW  COMPARISON:  Prior chest x-ray 10/11/2013  FINDINGS: Improved pulmonary edema and inspiratory volumes. Persistent right basilar infiltrate. Mild cardiomegaly. No large pleural effusion. No pneumothorax.  IMPRESSION: 1. Interval resolution of mild interstitial pulmonary edema. 2. Persistent right basilar infiltrate without significant interval change.   Electronically Signed   By: Jacqulynn Cadet [name]   On: 10/12/2013 08:50   Dg Chest Port 1 View  10/11/2013   CLINICAL DATA:  Shortness of breath.  EXAM: PORTABLE CHEST - 1 VIEW  COMPARISON:  None.  FINDINGS: Progressive asymmetric airspace disease greater on right. This may represent pulmonary edema. Right lower lobe infectious  infiltrate may also be present. Clinical correlation and followup until clearance recommended.  Cardiomegaly with prominent mitral valve calcifications.  Mildly tortuous aorta.  No gross pneumothorax.  IMPRESSION: Progressive asymmetric airspace disease greater on right. This may represent pulmonary edema. Right lower lobe infectious infiltrate may also be present. Clinical correlation and followup until clearance recommended.   Electronically Signed   By: Chauncey Cruel [name]   On: 10/11/2013 07:14    EKG Interpretation    Date/Time:  Thursday October 10 2013 11:13:39 EST Ventricular Rate:  129 PR Interval:  136 QRS Duration: 142 QT Interval:  348 QTC Calculation: 509 R Axis:   -97 Text Interpretation:  Sinus tachycardia Right bundle branch block Septal infarct , age undetermined Abnormal ECG No significant change since last tracing noted, but artifact makes interpretation Confirmed by Canary Brim  MD, Gumecindo Hopkin [phone]) on 10/10/2013 7:11:32 PM            MDM   1. CAP (community acquired pneumonia)   2. Acute-on-chronic respiratory failure   3. CHF, acute on chronic   4. Severe aortic valve stenosis   5. Elevated troponin   6. Acute renal failure   7. Chronic airway obstruction, not elsewhere classified   8. Cough   9. Obstructive chronic bronchitis with exacerbation   10. Septic shock(785.52)    Pt with hx of COPD, severe aortic stenosis presenting with difficulty breathing, cough, vomiting and diarrhea.  IV access obtained, pt placed on monitor, IV fluids started, given broad spectrum antibiotics for pneumonia on CXR- xray images reviewed by me, some concern for effusion as well.  BNP elevated above her baseline. Unclear how much of her symptoms are related to fluid overload versus infectious process, in the setting of her AS her fluid management will be difficult.  D/w cardiology on call who will see patient in consultation.  Also d/w PCCM who will admit patient.  Given 2 duonebs without  much improvement in respiratory status.  Will try on bipap.  Influenza screen pending.      Threasa Beards, MD 10/12/13 1438

## 2013-10-11 ENCOUNTER — Inpatient Hospital Stay (HOSPITAL_COMMUNITY): Payer: Medicare Other

## 2013-10-11 DIAGNOSIS — J9620 Acute and chronic respiratory failure, unspecified whether with hypoxia or hypercapnia: Secondary | ICD-10-CM

## 2013-10-11 DIAGNOSIS — R6521 Severe sepsis with septic shock: Secondary | ICD-10-CM

## 2013-10-11 DIAGNOSIS — A419 Sepsis, unspecified organism: Secondary | ICD-10-CM | POA: Diagnosis present

## 2013-10-11 DIAGNOSIS — I359 Nonrheumatic aortic valve disorder, unspecified: Secondary | ICD-10-CM

## 2013-10-11 LAB — INFLUENZA PANEL BY PCR (TYPE A & B)
H1N1 flu by pcr: NOT DETECTED
Influenza A By PCR: NEGATIVE
Influenza B By PCR: NEGATIVE

## 2013-10-11 LAB — RESPIRATORY VIRUS PANEL
Adenovirus: NOT DETECTED
Influenza A H1: NOT DETECTED
Influenza A H3: NOT DETECTED
Influenza A: NOT DETECTED
Influenza B: NOT DETECTED
Metapneumovirus: NOT DETECTED
Parainfluenza 1: NOT DETECTED
Parainfluenza 2: NOT DETECTED
Parainfluenza 3: NOT DETECTED
Respiratory Syncytial Virus A: NOT DETECTED
Respiratory Syncytial Virus B: NOT DETECTED
Rhinovirus: NOT DETECTED

## 2013-10-11 LAB — CBC
HCT: 36.3 % (ref 36.0–46.0)
Hemoglobin: 12.1 g/dL (ref 12.0–15.0)
MCH: 31 pg (ref 26.0–34.0)
MCHC: 33.3 g/dL (ref 30.0–36.0)
MCV: 93.1 fL (ref 78.0–100.0)
Platelets: 168 10*3/uL (ref 150–400)
RBC: 3.9 MIL/uL (ref 3.87–5.11)
RDW: 14.6 % (ref 11.5–15.5)
WBC: 18.1 10*3/uL — ABNORMAL HIGH (ref 4.0–10.5)

## 2013-10-11 LAB — LEGIONELLA ANTIGEN, URINE: Legionella Antigen, Urine: NEGATIVE

## 2013-10-11 LAB — BASIC METABOLIC PANEL
BUN: 63 mg/dL — ABNORMAL HIGH (ref 6–23)
CO2: 22 mEq/L (ref 19–32)
Calcium: 8 mg/dL — ABNORMAL LOW (ref 8.4–10.5)
Chloride: 100 mEq/L (ref 96–112)
Creatinine, Ser: 2.93 mg/dL — ABNORMAL HIGH (ref 0.50–1.10)
GFR calc Af Amer: 18 mL/min — ABNORMAL LOW (ref >90)
GFR calc non Af Amer: 15 mL/min — ABNORMAL LOW (ref >90)
Glucose, Bld: 159 mg/dL — ABNORMAL HIGH (ref 70–99)
Potassium: 4.4 mEq/L (ref 3.7–5.3)
Sodium: 138 mEq/L (ref 137–147)

## 2013-10-11 LAB — HEPARIN LEVEL (UNFRACTIONATED)
Heparin Unfractionated: <0.1 [IU]/mL — ABNORMAL LOW (ref 0.30–0.70)
Heparin Unfractionated: 0.34 [IU]/mL (ref 0.30–0.70)

## 2013-10-11 LAB — CK TOTAL AND CKMB (NOT AT ARMC)
CK, MB: 4.6 ng/mL — ABNORMAL HIGH (ref 0.3–4.0)
Relative Index: 4.1 — ABNORMAL HIGH (ref 0.0–2.5)
Total CK: 112 U/L (ref 7–177)

## 2013-10-11 LAB — STREP PNEUMONIAE URINARY ANTIGEN: Strep Pneumo Urinary Antigen: POSITIVE — AB

## 2013-10-11 LAB — TROPONIN I: Troponin I: 0.53 ng/mL (ref <0.30)

## 2013-10-11 MED ORDER — METHYLPREDNISOLONE SODIUM SUCC 40 MG IJ SOLR
40.0000 mg | INTRAMUSCULAR | Status: DC
  Administered 2013-10-12 – 2013-10-14 (×3): 40 mg via INTRAVENOUS
  Filled 2013-10-11 (×4): qty 1

## 2013-10-11 MED ORDER — DEXTROSE-NACL 5-0.45 % IV SOLN
INTRAVENOUS | Status: DC
  Administered 2013-10-12: 50 mL/h via INTRAVENOUS
  Administered 2013-10-14: 10 mL/h via INTRAVENOUS

## 2013-10-11 MED ORDER — LOVASTATIN 20 MG PO TABS
40.0000 mg | ORAL_TABLET | Freq: Every day | ORAL | Status: DC
  Administered 2013-10-11 – 2013-10-14 (×4): 40 mg via ORAL
  Filled 2013-10-11 (×5): qty 2

## 2013-10-11 MED ORDER — HEPARIN (PORCINE) IN NACL 100-0.45 UNIT/ML-% IJ SOLN
1500.0000 [IU]/h | INTRAMUSCULAR | Status: DC
Start: 2013-10-11 — End: 2013-10-12
  Administered 2013-10-12: 1500 [IU]/h via INTRAVENOUS
  Filled 2013-10-11 (×2): qty 250

## 2013-10-11 MED ORDER — BIOTENE DRY MOUTH MT LIQD
15.0000 mL | Freq: Two times a day (BID) | OROMUCOSAL | Status: DC
  Administered 2013-10-11 – 2013-10-12 (×2): 15 mL via OROMUCOSAL

## 2013-10-11 MED ORDER — HEPARIN BOLUS VIA INFUSION
5000.0000 [IU] | Freq: Once | INTRAVENOUS | Status: AC
Start: 2013-10-11 — End: 2013-10-11
  Administered 2013-10-11: 5000 [IU] via INTRAVENOUS
  Filled 2013-10-11: qty 5000

## 2013-10-11 NOTE — Progress Notes (Signed)
Subjective: Reports subjective improvement. Off of BiPap. On RA.   Objective: Vital signs in last 24 hours: Temp:  [97.6 F (36.4 C)-98.8 F (37.1 C)] 97.6 F (36.4 C) (01/02 0817) Pulse Rate:  [99-131] 103 (01/02 0830) Resp:  [18-37] 19 (01/02 0830) BP: (79-114)/(22-73) 110/49 mmHg (01/02 0830) SpO2:  [85 %-100 %] 97 % (01/02 0830) FiO2 (%):  [30 %] 30 % (01/01 2000) Weight:  [103.1 kg (227 lb 4.7 oz)-103.4 kg (227 lb 15.3 oz)] 103.4 kg (227 lb 15.3 oz) (01/02 0500) Last BM Date: 10/10/13  Intake/Output from previous day: 01/01 0701 - 01/02 0700 In: 2228.2 [I.V.:1478.2; IV Piggyback:750] Out: 240 [Urine:240] Intake/Output this shift: Total I/O In: 149 [I.V.:149] Out: 45 [Urine:45]  Medications Current Facility-Administered Medications  Medication Dose Route Frequency Provider Last Rate Last Dose  . antiseptic oral rinse (BIOTENE) solution 15 mL  15 mL Mouth Rinse BID Juanito Doom, MD      . aspirin EC tablet 81 mg  81 mg Oral Daily Juanito Doom, MD   81 mg at 10/10/13 1900  . azithromycin (ZITHROMAX) 500 mg in dextrose 5 % 250 mL IVPB  500 mg Intravenous Q24H Elsie Stain, MD      . cefTRIAXone (ROCEPHIN) 1 g in dextrose 5 % 50 mL IVPB  1 g Intravenous Q24H Patsey Berthold [place]      . dextrose 5 %-0.45 % sodium chloride infusion   Intravenous Continuous Juanito Doom, MD 75 mL/hr at 10/11/13 0800    . diltiazem (CARDIZEM CD) 24 hr capsule 120 mg  120 mg Oral QHS Elsie Stain, MD      . heparin ADULT infusion 100 units/mL (25000 units/250 mL)  1,400 Units/hr Intravenous Continuous Rikki Spearing, RPH 14 mL/hr at 10/11/13 0800 1,400 Units/hr at 10/11/13 0800  . ipratropium-albuterol (DUONEB) 0.5-2.5 (3) MG/3ML nebulizer solution 3 mL  3 mL Nebulization Q4H Elsie Stain, MD   3 mL at 10/11/13 0759  . losartan (COZAAR) tablet 50 mg  50 mg Oral QHS Elsie Stain, MD      . lovastatin (MEVACOR) tablet 40 mg  40 mg Oral q1800 Juanito Doom, MD       . methylPREDNISolone sodium succinate (SOLU-MEDROL) 125 mg/2 mL injection 60 mg  60 mg Intravenous Q12H Juanito Doom, MD   60 mg at 10/11/13 0630  . pantoprazole (PROTONIX) EC tablet 40 mg  40 mg Oral QHS Elsie Stain, MD      . phenylephrine (NEO-SYNEPHRINE) 10 mg in dextrose 5 % 250 mL infusion  30-200 mcg/min Intravenous Continuous Elsie Stain, MD 57 mL/hr at 10/11/13 0800 38 mcg/min at 10/11/13 0800    PE: General appearance: alert, cooperative and no distress Lungs: slight bibasilar crackles Heart: regular rate and rhythm and 3/6 SM Extremities: no LEE Pulses: 2+ and symmetric Skin: warm and dry Neurologic: Grossly normal  Lab Results:   Recent Labs  10/10/13 1127 10/10/13 1332 10/10/13 2007 10/11/13 0325  WBC 14.0*  --  12.1* 18.1*  HGB 15.3* 15.6* 13.3 12.1  HCT 45.5 46.0 39.4 36.3  PLT 182  --  135* 168   BMET  Recent Labs  10/10/13 1127 10/10/13 1332 10/10/13 2007 10/11/13 0325  NA 141 138  --  138  K 4.8 4.4  --  4.4  CL 97 103  --  100  CO2 18*  --   --  22  GLUCOSE 51* 65*  --  159*  BUN 46* 44*  --  63*  CREATININE 3.25* 3.40* 3.14* 2.93*  CALCIUM 9.5  --   --  8.0*   Cardiac Panel (last 3 results)  Recent Labs  10/10/13 1312  TROPONINI 0.34*    Assessment/Plan  Principal Problem:   PNA (pneumonia) Active Problems:   Morbid obesity   CHRONIC OBSTRUCTIVE PULMONARY DISEASE, ACUTE EXACERBATION   COPD   Severe aortic valve stenosis   CAP (community acquired pneumonia)   CHF, acute on chronic   Acute renal failure   Acute-on-chronic respiratory failure   Elevated troponin  Plan: On antibiotics for PNA. Blood cultures pending. WBC has increased to 18K, but also on steroids. Afebrile. Pt reports significant subjective improvement.  Now on RA. Renal function improved. Scr 2.93 today (3.25 on admission). Continue hydration w/ IVFs. Neo was initiated for hypotension. Continue IV heparin, in setting of elevated d-dimer and  elevated troponin (although likely type II MI). Will recycle troponins. ? W/u to r/o DVT and/or PE. 2D echo has been ordered. Will look for vegetations, as pt has severe AS.     LOS: 1 day    [name] 10/11/2013 9:19 AM

## 2013-10-11 NOTE — Progress Notes (Addendum)
Name: [name] MRN: [id number] DOB: [birth year]    ADMISSION DATE:  10/10/2013 CONSULTATION DATE:  10/10/2013  REFERRING MD :  EDP PRIMARY SERVICE: PCCM  CHIEF COMPLAINT:  Shortness of breath  BRIEF PATIENT DESCRIPTION: 68 y/o female with rheumatic valve disease-severe AS  and COPD was admitted on 1/1 with acute respiratory failure from severe CAP requiring BIPAP.  SIGNIFICANT EVENTS / STUDIES:  1/1 admission  LINES / TUBES:   CULTURES: 1/1 blood >> 1/1 ur strep >>POS 1/1 ur leg >>  ANTIBIOTICS: 1/1 ceftriaxone >> 1/1 azithro >>   SUBJECTIVE: Afebrile Breathing better Denies CP   VITAL SIGNS: Temp:  [97.6 F (36.4 C)-98.8 F (37.1 C)] 97.6 F (36.4 C) (01/02 0817) Pulse Rate:  [99-131] 100 (01/02 1030) Resp:  [18-37] 19 (01/02 1030) BP: (79-114)/(22-79) 112/62 mmHg (01/02 1030) SpO2:  [92 %-100 %] 98 % (01/02 1109) FiO2 (%):  [30 %] 30 % (01/01 2000) Weight:  [103.1 kg (227 lb 4.7 oz)-103.4 kg (227 lb 15.3 oz)] 103.4 kg (227 lb 15.3 oz) (01/02 0500) HEMODYNAMICS:   VENTILATOR SETTINGS: Vent Mode:  [-]  FiO2 (%):  [30 %] 30 % INTAKE / OUTPUT: Intake/Output     01/01 0701 - 01/02 0700 01/02 0701 - 01/03 0700   I.V. (mL/kg) 1478.2 (14.3) 435 (4.2)   IV Piggyback 750    Total Intake(mL/kg) 2228.2 (21.5) 435 (4.2)   Urine (mL/kg/hr) 240 105 (0.2)   Total Output 240 105   Net +1988.2 +330          PHYSICAL EXAMINATION: Gen: brathing better on RA HEENT: NCAT, EOMi, OP clear PULM: coarse crackles, egophany, tubular breath sounds R base, no wheezing  CV: RRR, systolic murmur noted, no JVD AB: BS+, soft, nontender, no hsm Ext: warm, no edema, no clubbing, no cyanosis Derm: no rash or skin breakdown Neuro: A&Ox4, CN II-XII intact, strength 5/5 in all 4 extremities    LABS:  CBC  Recent Labs Lab 10/10/13 1127 10/10/13 1332 10/10/13 2007 10/11/13 0325  WBC 14.0*  --  12.1* 18.1*  HGB 15.3* 15.6* 13.3 12.1  HCT 45.5 46.0 39.4 36.3  PLT  182  --  135* 168   Coag's No results found for this basename: APTT, INR,  in the last 168 hours BMET  Recent Labs Lab 10/10/13 1127 10/10/13 1332 10/10/13 2007 10/11/13 0325  NA 141 138  --  138  K 4.8 4.4  --  4.4  CL 97 103  --  100  CO2 18*  --   --  22  BUN 46* 44*  --  63*  CREATININE 3.25* 3.40* 3.14* 2.93*  GLUCOSE 51* 65*  --  159*   Electrolytes  Recent Labs Lab 10/10/13 1127 10/11/13 0325  CALCIUM 9.5 8.0*   Sepsis Markers No results found for this basename: LATICACIDVEN, PROCALCITON, O2SATVEN,  in the last 168 hours ABG  Recent Labs Lab 10/10/13 1802  PHART 7.344*  PCO2ART 29.9*  PO2ART 79.0*   Liver Enzymes  Recent Labs Lab 10/10/13 1127  AST 24  ALT 12  ALKPHOS 125*  BILITOT 1.2  ALBUMIN 3.1*   Cardiac Enzymes  Recent Labs Lab 10/10/13 1312  TROPONINI 0.34*  PROBNP >70000.0*   Glucose No results found for this basename: GLUCAP,  in the last 168 hours  Imaging Dg Chest Port 1 View  10/11/2013   CLINICAL DATA:  Shortness of breath.  EXAM: PORTABLE CHEST - 1 VIEW  COMPARISON:  None.  FINDINGS:  Progressive asymmetric airspace disease greater on right. This may represent pulmonary edema. Right lower lobe infectious infiltrate may also be present. Clinical correlation and followup until clearance recommended.  Cardiomegaly with prominent mitral valve calcifications.  Mildly tortuous aorta.  No gross pneumothorax.  IMPRESSION: Progressive asymmetric airspace disease greater on right. This may represent pulmonary edema. Right lower lobe infectious infiltrate may also be present. Clinical correlation and followup until clearance recommended.   Electronically Signed   By: Chauncey Cruel [name]   On: 10/11/2013 07:14   Dg Chest Portable 1 View  10/10/2013   CLINICAL DATA:  Shortness of breath and emesis.  EXAM: PORTABLE CHEST - 1 VIEW  COMPARISON:  11/15/2012  FINDINGS: Patient is rotated to the left. Lungs are adequately inflated and demonstrate  moderate consolidation over the right base which may be due to 8 pneumonia versus layering pleural fluid with atelectasis. Mild stable cardiomegaly. Remainder the exam is unchanged.  IMPRESSION: Moderate opacification over the right mid to lower lung which may be due to a pneumonia versus layering pleural effusion/atelectasis.  Stable cardiomegaly.   Electronically Signed   By: Marin Olp [name]   On: 10/10/2013 12:38     CXR: RLL infiltrate and ? effusion  ASSESSMENT / PLAN:  PULMONARY A: Severe CAP  Acute respiratory failure from pneumonia and possibly asthma exac P:   -lower solumedrol -scheduled bronchodilators -doubt much effusion on todays CXR   CARDIOVASCULAR A: Severe Rheumatic heart disease with MS and AS NSTEMI, likely demand P:  -f/u cardiology recs -gentle hydration, coming off neo gtt -heparin per pharm -echo -hold cardizem & losartan while on neo gtt, doubt need for CVL   RENAL A:  AKI > presumably pre-renal or ATN P:   -gentle IVF -monitor UOP -serial BMET  GASTROINTESTINAL A:  Nausea > resolved P:   -NPO on BIPAP -anti-emetic as needed  HEMATOLOGIC A:  No acute issues P:  -Monitor for bleeding  INFECTIOUS A:  Severe CAP, Pneumococcal P:   -continue ceftriaxone and azithro for now -send flu test  ENDOCRINE A:  No acute issues   P:   -monitor glucose  NEUROLOGIC A:  No acute issues P:    Pt & Husband updated at bedside   I have personally obtained a history, examined the patient, evaluated laboratory and imaging results, formulated the assessment and plan and placed orders. CRITICAL CARE: The patient is critically ill with multiple organ systems failure and requires high complexity decision making for assessment and support, frequent evaluation and titration of therapies, application of advanced monitoring technologies and extensive interpretation of multiple databases. Critical Care Time devoted to patient care services described in  this note is 31 minutes.   Gwenevere Abbot MD. [facility] Flood. [facility] Pager [phone] If no response call [phone]     10/11/2013, 11:19 AM

## 2013-10-11 NOTE — Progress Notes (Signed)
Pt. Seen and examined. Agree with the NP/PA-C note as written.  She looks much better today. CXR appears to be a blossoming right lung pneumonia, +/- some pulmonary edema. We are continuing to hydrate for sepsis and she is requiring a higher dose of neosynephrine. Urine is interestingly positive for S. pneumo antigen, however, she did have the pneumococcal vaccine in 06/2013. We will repeat troponin today, however, I suspect mild increase is again due to sepsis. Will review 2D echo today to evaluate for obvious vegetation. I would recommend a central line given high dose neosynephrine - this will also allow [place] to follow CVP.  Pixie Casino, MD, [facility] Attending Cardiologist [place]

## 2013-10-11 NOTE — Progress Notes (Signed)
Utilization review completed. Orestes Geiman, RN, BSN. 

## 2013-10-11 NOTE — Progress Notes (Signed)
  Echocardiogram 2D Echocardiogram has been performed.  Shannelle Alguire FRANCES 10/11/2013, 12:24 PM

## 2013-10-11 NOTE — Plan of Care (Signed)
Problem: Phase I Progression Outcomes Goal: Voiding-avoid urinary catheter unless indicated Outcome: Not Met (add Reason) Foley necessary d/t ARF, need for strict I&O

## 2013-10-11 NOTE — Progress Notes (Signed)
ANTICOAGULATION CONSULT NOTE - Follow Up Consult  Pharmacy Consult for Heparin Indication: chest pain/ACS (and r/o DVT/PE per Cardiology notes)  Allergies  Allergen Reactions  . Codeine Nausea Only  . Prednisone Hives    In different places all over the body.  . Sulfonamide Derivatives Nausea Only    Patient Measurements: Height: 5' 8.11" (173 cm) Weight: 227 lb 15.3 oz (103.4 kg) IBW/kg (Calculated) : 64.15 Heparin Dosing Weight: 87 kg  Vital Signs: Temp: 98 F (36.7 C) (01/02 1533) Temp src: Oral (01/02 1533) BP: 104/53 mmHg (01/02 1530) Pulse Rate: 104 (01/02 1600)  Labs:  Recent Labs  10/10/13 1127 10/10/13 1312 10/10/13 1332 10/10/13 2007 10/11/13 0325 10/11/13 1038 10/11/13 1505  HGB 15.3*  --  15.6* 13.3 12.1  --   --   HCT 45.5  --  46.0 39.4 36.3  --   --   PLT 182  --   --  135* 168  --   --   HEPARINUNFRC  --   --   --   --  <0.10*  --  0.34  CREATININE 3.25*  --  3.40* 3.14* 2.93*  --   --   CKTOTAL  --   --   --   --   --  112  --   CKMB  --   --   --   --   --  4.6*  --   TROPONINI  --  0.34*  --   --   --  0.53*  --     Estimated Creatinine Clearance: 23.2 ml/min (by C-G formula based on Cr of 2.93).   Medications:  Heparin infusion @ 1400 units/hr.   Assessment: 28 YOF with known rheumatic heart disease on IV heparin for ACS and r/o DVT/PE. ECHO pending.  Heparin level after re-bolus and re-initiation is therapeutic at the low end of goal. Per RN, no issues with line or bleeding.   Goal of Therapy:  Heparin level 0.3-0.7 units/ml Monitor platelets by anticoagulation protocol: Yes   Plan:  1. Increase heparin to 1500 units/hr to keep in goal range. 2. Confirmatory heparin level in 8 hours.   Sloan Leiter, PharmD, BCPS Clinical Pharmacist [phone] 10/11/2013,4:06 PM

## 2013-10-12 ENCOUNTER — Inpatient Hospital Stay (HOSPITAL_COMMUNITY): Payer: Medicare Other

## 2013-10-12 DIAGNOSIS — I359 Nonrheumatic aortic valve disorder, unspecified: Secondary | ICD-10-CM

## 2013-10-12 DIAGNOSIS — R6521 Severe sepsis with septic shock: Secondary | ICD-10-CM

## 2013-10-12 DIAGNOSIS — A419 Sepsis, unspecified organism: Secondary | ICD-10-CM

## 2013-10-12 LAB — CBC
HCT: 32.4 % — ABNORMAL LOW (ref 36.0–46.0)
Hemoglobin: 11.1 g/dL — ABNORMAL LOW (ref 12.0–15.0)
MCH: 31.4 pg (ref 26.0–34.0)
MCHC: 34.3 g/dL (ref 30.0–36.0)
MCV: 91.8 fL (ref 78.0–100.0)
Platelets: DECREASED 10*3/uL (ref 150–400)
RBC: 3.53 MIL/uL — ABNORMAL LOW (ref 3.87–5.11)
RDW: 14.4 % (ref 11.5–15.5)
WBC: 10.1 10*3/uL (ref 4.0–10.5)

## 2013-10-12 LAB — BASIC METABOLIC PANEL
BUN: 65 mg/dL — ABNORMAL HIGH (ref 6–23)
CO2: 21 mEq/L (ref 19–32)
Calcium: 8.5 mg/dL (ref 8.4–10.5)
Chloride: 100 mEq/L (ref 96–112)
Creatinine, Ser: 1.81 mg/dL — ABNORMAL HIGH (ref 0.50–1.10)
GFR calc Af Amer: 32 mL/min — ABNORMAL LOW (ref >90)
GFR calc non Af Amer: 28 mL/min — ABNORMAL LOW (ref >90)
Glucose, Bld: 106 mg/dL — ABNORMAL HIGH (ref 70–99)
Potassium: 4.2 mEq/L (ref 3.7–5.3)
Sodium: 136 mEq/L — ABNORMAL LOW (ref 137–147)

## 2013-10-12 LAB — HEPARIN LEVEL (UNFRACTIONATED): Heparin Unfractionated: 0.48 IU/mL (ref 0.30–0.70)

## 2013-10-12 LAB — TROPONIN I: Troponin I: 0.45 ng/mL (ref <0.30)

## 2013-10-12 LAB — PRO B NATRIURETIC PEPTIDE: Pro B Natriuretic peptide (BNP): 8290 pg/mL — ABNORMAL HIGH (ref 0–125)

## 2013-10-12 MED ORDER — IPRATROPIUM-ALBUTEROL 0.5-2.5 (3) MG/3ML IN SOLN
3.0000 mL | Freq: Four times a day (QID) | RESPIRATORY_TRACT | Status: DC
  Administered 2013-10-12 – 2013-10-15 (×11): 3 mL via RESPIRATORY_TRACT
  Filled 2013-10-12 (×11): qty 3

## 2013-10-12 NOTE — Progress Notes (Signed)
Name: [name] MRN: [id number] DOB: 1946-09-30    ADMISSION DATE:  10/10/2013 CONSULTATION DATE:  10/10/2013  REFERRING MD :  EDP PRIMARY SERVICE: PCCM  CHIEF COMPLAINT:  Shortness of breath  BRIEF PATIENT DESCRIPTION: 68 y/o female with rheumatic valve disease-severe AS  and COPD was admitted on 1/1 with acute respiratory failure from severe CAP requiring BIPAP.  SIGNIFICANT EVENTS / STUDIES:  TTE 1/2 > normal LV fxn, severe AS, severe MV calcification, mildly dilated RV  LINES / TUBES:   CULTURES: 1/1 blood >> 1/1 ur strep >>POS 1/1 ur leg >> 1/1 flu >> negative  ANTIBIOTICS: 1/1 ceftriaxone >> 1/1 azithro >>  SUBJECTIVE:  Pressors off Weaned to RA  VITAL SIGNS: Temp:  [97.3 F (36.3 C)-98.1 F (36.7 C)] 97.9 F (36.6 C) (01/03 0806) Pulse Rate:  [78-104] 83 (01/03 1000) Resp:  [18-28] 19 (01/03 1000) BP: (96-129)/(47-68) 113/52 mmHg (01/03 1000) SpO2:  [93 %-100 %] 99 % (01/03 1000) Weight:  [103.1 kg (227 lb 4.7 oz)] 103.1 kg (227 lb 4.7 oz) (01/03 0500) HEMODYNAMICS:   VENTILATOR SETTINGS:   INTAKE / OUTPUT: Intake/Output     01/02 0701 - 01/03 0700 01/03 0701 - 01/04 0700   P.O. 420 220   I.V. (mL/kg) 1889.7 (18.3) 195 (1.9)   IV Piggyback 300    Total Intake(mL/kg) 2609.7 (25.3) 415 (4)   Urine (mL/kg/hr) 1905 (0.8) 300 (0.8)   Total Output 1905 300   Net +704.7 +115        Stool Occurrence 2 x      PHYSICAL EXAMINATION: Gen: breathing better, on RA HEENT: NCAT, EOMi, OP clear PULM: coarse crackles, egophany, tubular breath sounds R base, no wheezing  CV: RRR, systolic murmur noted, no JVD AB: BS+, soft, nontender, no hsm Ext: warm, no edema, no clubbing, no cyanosis Derm: no rash or skin breakdown Neuro: A&Ox4, CN II-XII intact, strength 5/5 in all 4 extremities   LABS:  CBC  Recent Labs Lab 10/10/13 2007 10/11/13 0325 10/12/13 0230  WBC 12.1* 18.1* 10.1  HGB 13.3 12.1 11.1*  HCT 39.4 36.3 32.4*  PLT 135* 168  PLATELET CLUMPS NOTED ON SMEAR, COUNT APPEARS DECREASED   Coag's No results found for this basename: APTT, INR,  in the last 168 hours BMET  Recent Labs Lab 10/10/13 1127 10/10/13 1332 10/10/13 2007 10/11/13 0325 10/12/13 0230  NA 141 138  --  138 136*  K 4.8 4.4  --  4.4 4.2  CL 97 103  --  100 100  CO2 18*  --   --  22 21  BUN 46* 44*  --  63* 65*  CREATININE 3.25* 3.40* 3.14* 2.93* 1.81*  GLUCOSE 51* 65*  --  159* 106*   Electrolytes  Recent Labs Lab 10/10/13 1127 10/11/13 0325 10/12/13 0230  CALCIUM 9.5 8.0* 8.5   Sepsis Markers No results found for this basename: LATICACIDVEN, PROCALCITON, O2SATVEN,  in the last 168 hours ABG  Recent Labs Lab 10/10/13 1802  PHART 7.344*  PCO2ART 29.9*  PO2ART 79.0*   Liver Enzymes  Recent Labs Lab 10/10/13 1127  AST 24  ALT 12  ALKPHOS 125*  BILITOT 1.2  ALBUMIN 3.1*   Cardiac Enzymes  Recent Labs Lab 10/10/13 1312 10/11/13 1038 10/12/13 0230  TROPONINI 0.34* 0.53* 0.45*  PROBNP >70000.0*  --  8290.0*   Glucose No results found for this basename: GLUCAP,  in the last 168 hours  Imaging Dg [facility] 1 [address]  10/12/2013   CLINICAL DATA:  Followup right lower lobe pneumonia  EXAM: PORTABLE CHEST - 1 VIEW  COMPARISON:  Prior chest x-ray 10/11/2013  FINDINGS: Improved pulmonary edema and inspiratory volumes. Persistent right basilar infiltrate. Mild cardiomegaly. No large pleural effusion. No pneumothorax.  IMPRESSION: 1. Interval resolution of mild interstitial pulmonary edema. 2. Persistent right basilar infiltrate without significant interval change.   Electronically Signed   By: Jacqulynn Cadet [name]   On: 10/12/2013 08:50   Dg Chest Port 1 View  10/11/2013   CLINICAL DATA:  Shortness of breath.  EXAM: PORTABLE CHEST - 1 VIEW  COMPARISON:  None.  FINDINGS: Progressive asymmetric airspace disease greater on right. This may represent pulmonary edema. Right lower lobe infectious infiltrate may also be present.  Clinical correlation and followup until clearance recommended.  Cardiomegaly with prominent mitral valve calcifications.  Mildly tortuous aorta.  No gross pneumothorax.  IMPRESSION: Progressive asymmetric airspace disease greater on right. This may represent pulmonary edema. Right lower lobe infectious infiltrate may also be present. Clinical correlation and followup until clearance recommended.   Electronically Signed   By: Chauncey Cruel [name]   On: 10/11/2013 07:14   Dg Chest Portable 1 View  10/10/2013   CLINICAL DATA:  Shortness of breath and emesis.  EXAM: PORTABLE CHEST - 1 VIEW  COMPARISON:  11/15/2012  FINDINGS: Patient is rotated to the left. Lungs are adequately inflated and demonstrate moderate consolidation over the right base which may be due to 8 pneumonia versus layering pleural fluid with atelectasis. Mild stable cardiomegaly. Remainder the exam is unchanged.  IMPRESSION: Moderate opacification over the right mid to lower lung which may be due to a pneumonia versus layering pleural effusion/atelectasis.  Stable cardiomegaly.   Electronically Signed   By: Marin Olp [name]   On: 10/10/2013 12:38     CXR: 1/3 > persistent RLL infiltrate, edema pattern improved  ASSESSMENT / PLAN:  PULMONARY A: Severe RLL CAP  Acute respiratory failure from pneumonia and possibly asthma exac, pulm edema P:   -lower solumedrol, she carries a prednisone allergy, hives -scheduled bronchodilators   CARDIOVASCULAR A: Severe Rheumatic heart disease with MS and AS NSTEMI, likely demand P:  -f/u cardiology recs -d/c heparin for demand ACS -hold cardizem & losartan, attempt restart 1/4  RENAL A:  AKI > presumably pre-renal or ATN P:   -gentle IVF -monitor UOP -serial BMET  GASTROINTESTINAL A:  Nausea > resolved P:   -advance diet 1/3  HEMATOLOGIC A:  No acute issues P:  -Monitor for bleeding  INFECTIOUS A:  Severe CAP, Pneumococcal P:   -continue ceftriaxone and azithro, day 3 on  1/3  ENDOCRINE A:  No acute issues   P:   -monitor glucose  NEUROLOGIC A:  No acute issues P:    Pt & Husband updated at bedside Should be OK to transfer to tele 1/3   I have personally obtained a history, examined the patient, evaluated laboratory and imaging results, formulated the assessment and plan and placed orders.  Baltazar Apo, MD, PhD 10/12/2013, 10:43 AM [facility] [phone] or if no answer [phone]

## 2013-10-12 NOTE — Progress Notes (Addendum)
DAILY PROGRESS NOTE  Subjective:  Continues to clinically improve. Off of pressors and CXR appears to be improving. Continue on azithromycin and ceftriaxone for pneumococcal pneumonia. Appears clinically compensated with heart failure.   Objective:  Temp:  [97.3 F (36.3 C)-98.1 F (36.7 C)] 97.9 F (36.6 C) (01/03 0806) Pulse Rate:  [78-104] 83 (01/03 1000) Resp:  [18-28] 19 (01/03 1000) BP: (96-129)/(47-65) 113/52 mmHg (01/03 1000) SpO2:  [93 %-100 %] 99 % (01/03 1000) Weight:  [227 lb 4.7 oz (103.1 kg)] 227 lb 4.7 oz (103.1 kg) (01/03 0500) Weight change: 0 lb (0 kg)  Intake/Output from previous day: 01/02 0701 - 01/03 0700 In: 2609.7 [P.O.:420; I.V.:1889.7; IV Piggyback:300] Out: 1905 [Urine:1905]  Intake/Output from this shift: Total I/O In: 415 [P.O.:220; I.V.:195] Out: 300 [Urine:300]  Medications: Current Facility-Administered Medications  Medication Dose Route Frequency Provider Last Rate Last Dose  . antiseptic oral rinse (BIOTENE) solution 15 mL  15 mL Mouth Rinse BID Juanito Doom, MD   15 mL at 10/12/13 0800  . aspirin EC tablet 81 mg  81 mg Oral Daily Juanito Doom, MD   81 mg at 10/12/13 1008  . azithromycin (ZITHROMAX) 500 mg in dextrose 5 % 250 mL IVPB  500 mg Intravenous Q24H Elsie Stain, MD   500 mg at 10/11/13 1237  . cefTRIAXone (ROCEPHIN) 1 g in dextrose 5 % 50 mL IVPB  1 g Intravenous Q24H Patsey Berthold Campanillas, RPH   1 g at 10/11/13 1210  . dextrose 5 %-0.45 % sodium chloride infusion   Intravenous Continuous Collene Gobble, MD 50 mL/hr at 10/12/13 0826 50 mL/hr at 10/12/13 0826  . ipratropium-albuterol (DUONEB) 0.5-2.5 (3) MG/3ML nebulizer solution 3 mL  3 mL Nebulization Q4H Elsie Stain, MD   3 mL at 10/12/13 0844  . lovastatin (MEVACOR) tablet 40 mg  40 mg Oral q1800 Juanito Doom, MD   40 mg at 10/11/13 1711  . methylPREDNISolone sodium succinate (SOLU-MEDROL) 40 mg/mL injection 40 mg  40 mg Intravenous Q24H Rigoberto Noel,  MD   40 mg at 10/12/13 0630  . pantoprazole (PROTONIX) EC tablet 40 mg  40 mg Oral QHS Elsie Stain, MD   40 mg at 10/11/13 2200    Physical Exam: General appearance: alert and no distress Neck: no carotid bruit and no JVD Lungs: diminished breath sounds bibasilar Heart: regular rate and rhythm, S1: normal, S2: decreased intensity and systolic murmur: late systolic 3/6, harsh at 2nd right intercostal space Abdomen: soft, non-tender; bowel sounds normal; no masses,  no organomegaly and obese Extremities: extremities normal, atraumatic, no cyanosis or edema Pulses: 2+ and symmetric Skin: pale, warm, dry Neurologic: Mental status: Alert, oriented, thought content appropriate  Lab Results: Results for orders placed during the hospital encounter of 10/10/13 (from the past 48 hour(s))  CBC WITH DIFFERENTIAL     Status: Abnormal   Collection Time    10/10/13 11:27 AM      Result Value Range   WBC 14.0 (*) 4.0 - 10.5 K/uL   RBC 4.84  3.87 - 5.11 MIL/uL   Hemoglobin 15.3 (*) 12.0 - 15.0 g/dL   HCT 45.5  36.0 - 46.0 %   MCV 94.0  78.0 - 100.0 fL   MCH 31.6  26.0 - 34.0 pg   MCHC 33.6  30.0 - 36.0 g/dL   RDW 14.3  11.5 - 15.5 %   Platelets 182  150 - 400 K/uL   Neutrophils  Relative % 95 (*) 43 - 77 %   Lymphocytes Relative 3 (*) 12 - 46 %   Monocytes Relative 2 (*) 3 - 12 %   Eosinophils Relative 0  0 - 5 %   Basophils Relative 0  0 - 1 %   Neutro Abs 13.3 (*) 1.7 - 7.7 K/uL   Lymphs Abs 0.4 (*) 0.7 - 4.0 K/uL   Monocytes Absolute 0.3  0.1 - 1.0 K/uL   Eosinophils Absolute 0.0  0.0 - 0.7 K/uL   Basophils Absolute 0.0  0.0 - 0.1 K/uL   WBC Morphology TOXIC GRANULATION    COMPREHENSIVE METABOLIC PANEL     Status: Abnormal   Collection Time    10/10/13 11:27 AM      Result Value Range   Sodium 141  137 - 147 mEq/L   Comment: Please note change in reference range.   Potassium 4.8  3.7 - 5.3 mEq/L   Comment: Please note change in reference range.   Chloride 97  96 - 112 mEq/L    CO2 18 (*) 19 - 32 mEq/L   Glucose, Bld 51 (*) 70 - 99 mg/dL   BUN 46 (*) 6 - 23 mg/dL   Creatinine, Ser 3.30 (*) 0.50 - 1.10 mg/dL   Calcium 9.5  8.4 - 05.6 mg/dL   Total Protein 7.3  6.0 - 8.3 g/dL   Albumin 3.1 (*) 3.5 - 5.2 g/dL   AST 24  0 - 37 U/L   ALT 12  0 - 35 U/L   Alkaline Phosphatase 125 (*) 39 - 117 U/L   Total Bilirubin 1.2  0.3 - 1.2 mg/dL   GFR calc non Af Amer 14 (*) >90 mL/min   GFR calc Af Amer 16 (*) >90 mL/min   Comment: (NOTE)     The eGFR has been calculated using the CKD EPI equation.     This calculation has not been validated in all clinical situations.     eGFR's persistently <90 mL/min signify possible Chronic Kidney     Disease.  TROPONIN I     Status: Abnormal   Collection Time    10/10/13  1:12 PM      Result Value Range   Troponin I 0.34 (*) <0.30 ng/mL   Comment:            Due to the release kinetics of cTnI,     a negative result within the first hours     of the onset of symptoms does not rule out     myocardial infarction with certainty.     If myocardial infarction is still suspected,     repeat the test at appropriate intervals.     CRITICAL RESULT CALLED TO, READ BACK BY AND VERIFIED WITH:     B.CHANDLER,RN 10/10/13 1413 BY BSLADE  PRO B NATRIURETIC PEPTIDE     Status: Abnormal   Collection Time    10/10/13  1:12 PM      Result Value Range   Pro B Natriuretic peptide (BNP) >70000.0 (*) 0 - 125 pg/mL  D-DIMER, QUANTITATIVE     Status: Abnormal   Collection Time    10/10/13  1:12 PM      Result Value Range   D-Dimer, Quant 2.65 (*) 0.00 - 0.48 ug/mL-FEU   Comment:            AT THE INHOUSE ESTABLISHED CUTOFF     VALUE OF 0.48 ug/mL FEU,     THIS  ASSAY HAS BEEN DOCUMENTED     IN THE LITERATURE TO HAVE     A SENSITIVITY AND NEGATIVE     PREDICTIVE VALUE OF AT LEAST     98 TO 99%.  THE TEST RESULT     SHOULD BE CORRELATED WITH     AN ASSESSMENT OF THE CLINICAL     PROBABILITY OF DVT / VTE.  POCT I-STAT, CHEM 8     Status: Abnormal    Collection Time    10/10/13  1:32 PM      Result Value Range   Sodium 138  137 - 147 mEq/L   Potassium 4.4  3.7 - 5.3 mEq/L   Chloride 103  96 - 112 mEq/L   BUN 44 (*) 6 - 23 mg/dL   Creatinine, Ser 3.40 (*) 0.50 - 1.10 mg/dL   Glucose, Bld 65 (*) 70 - 99 mg/dL   Calcium, Ion 1.08 (*) 1.13 - 1.30 mmol/L   TCO2 21  0 - 100 mmol/L   Hemoglobin 15.6 (*) 12.0 - 15.0 g/dL   HCT 46.0  36.0 - 46.0 %  POCT I-STAT 3, BLOOD GAS (G3+)     Status: Abnormal   Collection Time    10/10/13  6:02 PM      Result Value Range   pH, Arterial 7.344 (*) 7.350 - 7.450   pCO2 arterial 29.9 (*) 35.0 - 45.0 mmHg   pO2, Arterial 79.0 (*) 80.0 - 100.0 mmHg   Bicarbonate 16.3 (*) 20.0 - 24.0 mEq/L   TCO2 17  0 - 100 mmol/L   O2 Saturation 95.0     Acid-base deficit 8.0 (*) 0.0 - 2.0 mmol/L   Collection site RADIAL, ALLEN'S TEST ACCEPTABLE     Drawn by Operator     Sample type ARTERIAL    INFLUENZA PANEL BY PCR     Status: None   Collection Time    10/10/13  7:17 PM      Result Value Range   Influenza A By PCR NEGATIVE  NEGATIVE   Influenza B By PCR NEGATIVE  NEGATIVE   H1N1 flu by pcr NOT DETECTED  NOT DETECTED   Comment:            The Xpert Flu assay (FDA approved for     nasal aspirates or washes and     nasopharyngeal swab specimens), is     intended as an aid in the diagnosis of     influenza and should not be used as     a sole basis for treatment.  RESPIRATORY VIRUS PANEL     Status: None   Collection Time    10/10/13  7:17 PM      Result Value Range   Source - RVPAN NASAL SWAB     Comment: CORRECTED ON 01/02 AT 2306: PREVIOUSLY REPORTED AS NASAL SWAB   Respiratory Syncytial Virus A NOT DETECTED     Respiratory Syncytial Virus B NOT DETECTED     Influenza A NOT DETECTED     Influenza B NOT DETECTED     Parainfluenza 1 NOT DETECTED     Parainfluenza 2 NOT DETECTED     Parainfluenza 3 NOT DETECTED     Metapneumovirus NOT DETECTED     Rhinovirus NOT DETECTED     Adenovirus NOT  DETECTED     Influenza A H1 NOT DETECTED     Influenza A H3 NOT DETECTED     Comment: (NOTE)  Normal Reference Range for each Analyte: NOT DETECTED     Testing performed using the Luminex xTAG Respiratory Viral Panel test     kit.     This test was developed and its performance characteristics determined     by [employer]. It has not been cleared or approved by the [place]     Food and Drug Administration. This test is used for clinical purposes.     It should not be regarded as investigational or for research. This     laboratory is certified under the [place] (CLIA) as qualified to perform high complexity     clinical laboratory testing.     Performed at [place] PCR SCREENING     Status: None   Collection Time    10/10/13  7:17 PM      Result Value Range   MRSA by PCR NEGATIVE  NEGATIVE   Comment:            The GeneXpert MRSA Assay (FDA     approved for NASAL specimens     only), is one component of a     comprehensive MRSA colonization     surveillance program. It is not     intended to diagnose MRSA     infection nor to guide or     monitor treatment for     MRSA infections.  CBC     Status: Abnormal   Collection Time    10/10/13  8:07 PM      Result Value Range   WBC 12.1 (*) 4.0 - 10.5 K/uL   RBC 4.27  3.87 - 5.11 MIL/uL   Hemoglobin 13.3  12.0 - 15.0 g/dL   HCT 39.4  36.0 - 46.0 %   MCV 92.3  78.0 - 100.0 fL   MCH 31.1  26.0 - 34.0 pg   MCHC 33.8  30.0 - 36.0 g/dL   RDW 14.6  11.5 - 15.5 %   Platelets 135 (*) 150 - 400 K/uL   Comment: REPEATED TO VERIFY     SPECIMEN CHECKED FOR CLOTS     DELTA CHECK NOTED  CREATININE, SERUM     Status: Abnormal   Collection Time    10/10/13  8:07 PM      Result Value Range   Creatinine, Ser 3.14 (*) 0.50 - 1.10 mg/dL   GFR calc non Af Amer 14 (*) >90 mL/min   GFR calc Af Amer 16 (*) >90 mL/min   Comment: (NOTE)     The eGFR has been calculated  using the CKD EPI equation.     This calculation has not been validated in all clinical situations.     eGFR's persistently <90 mL/min signify possible Chronic Kidney     Disease.  STREP PNEUMONIAE URINARY ANTIGEN     Status: Abnormal   Collection Time    10/10/13 11:16 PM      Result Value Range   Strep Pneumo Urinary Antigen POSITIVE (*) NEGATIVE  LEGIONELLA ANTIGEN, URINE     Status: None   Collection Time    10/10/13 11:16 PM      Result Value Range   Specimen Description URINE, CATHETERIZED     Special Requests NONE     Legionella Antigen, Urine       Value: Negative for Legionella pneumophilia serogroup 1     Performed at [place]  10/11/2013 FINAL    CBC     Status: Abnormal   Collection Time    10/11/13  3:25 AM      Result Value Range   WBC 18.1 (*) 4.0 - 10.5 K/uL   RBC 3.90  3.87 - 5.11 MIL/uL   Hemoglobin 12.1  12.0 - 15.0 g/dL   HCT 36.3  36.0 - 46.0 %   MCV 93.1  78.0 - 100.0 fL   MCH 31.0  26.0 - 34.0 pg   MCHC 33.3  30.0 - 36.0 g/dL   RDW 14.6  11.5 - 15.5 %   Platelets 168  150 - 400 K/uL  BASIC METABOLIC PANEL     Status: Abnormal   Collection Time    10/11/13  3:25 AM      Result Value Range   Sodium 138  137 - 147 mEq/L   Comment: Please note change in reference range.   Potassium 4.4  3.7 - 5.3 mEq/L   Comment: Please note change in reference range.   Chloride 100  96 - 112 mEq/L   CO2 22  19 - 32 mEq/L   Glucose, Bld 159 (*) 70 - 99 mg/dL   BUN 63 (*) 6 - 23 mg/dL   Creatinine, Ser 2.93 (*) 0.50 - 1.10 mg/dL   Calcium 8.0 (*) 8.4 - 10.5 mg/dL   GFR calc non Af Amer 15 (*) >90 mL/min   GFR calc Af Amer 18 (*) >90 mL/min   Comment: (NOTE)     The eGFR has been calculated using the CKD EPI equation.     This calculation has not been validated in all clinical situations.     eGFR's persistently <90 mL/min signify possible Chronic Kidney     Disease.  HEPARIN LEVEL (UNFRACTIONATED)     Status: Abnormal   Collection Time     10/11/13  3:25 AM      Result Value Range   Heparin Unfractionated <0.10 (*) 0.30 - 0.70 IU/mL   Comment:            IF HEPARIN RESULTS ARE BELOW     EXPECTED VALUES, AND PATIENT     DOSAGE HAS BEEN CONFIRMED,     SUGGEST FOLLOW UP TESTING     OF ANTITHROMBIN III LEVELS.  TROPONIN I     Status: Abnormal   Collection Time    10/11/13 10:38 AM      Result Value Range   Troponin I 0.53 (*) <0.30 ng/mL   Comment:            Due to the release kinetics of cTnI,     a negative result within the first hours     of the onset of symptoms does not rule out     myocardial infarction with certainty.     If myocardial infarction is still suspected,     repeat the test at appropriate intervals.     CRITICAL VALUE NOTED.  VALUE IS CONSISTENT WITH PREVIOUSLY REPORTED AND CALLED VALUE.  CK TOTAL AND CKMB     Status: Abnormal   Collection Time    10/11/13 10:38 AM      Result Value Range   Total CK 112  7 - 177 U/L   CK, MB 4.6 (*) 0.3 - 4.0 ng/mL   Relative Index 4.1 (*) 0.0 - 2.5  HEPARIN LEVEL (UNFRACTIONATED)     Status: None   Collection Time    10/11/13  3:05 PM  Result Value Range   Heparin Unfractionated 0.34  0.30 - 0.70 IU/mL   Comment:            IF HEPARIN RESULTS ARE BELOW     EXPECTED VALUES, AND PATIENT     DOSAGE HAS BEEN CONFIRMED,     SUGGEST FOLLOW UP TESTING     OF ANTITHROMBIN III LEVELS.  CBC     Status: Abnormal   Collection Time    10/12/13  2:30 AM      Result Value Range   WBC 10.1  4.0 - 10.5 K/uL   RBC 3.53 (*) 3.87 - 5.11 MIL/uL   Hemoglobin 11.1 (*) 12.0 - 15.0 g/dL   HCT 32.4 (*) 36.0 - 46.0 %   MCV 91.8  78.0 - 100.0 fL   MCH 31.4  26.0 - 34.0 pg   MCHC 34.3  30.0 - 36.0 g/dL   RDW 14.4  11.5 - 15.5 %   Platelets    150 - 400 K/uL   Value: PLATELET CLUMPS NOTED ON SMEAR, COUNT APPEARS DECREASED  PRO B NATRIURETIC PEPTIDE     Status: Abnormal   Collection Time    10/12/13  2:30 AM      Result Value Range   Pro B Natriuretic peptide (BNP)  8290.0 (*) 0 - 125 pg/mL  BASIC METABOLIC PANEL     Status: Abnormal   Collection Time    10/12/13  2:30 AM      Result Value Range   Sodium 136 (*) 137 - 147 mEq/L   Comment: Please note change in reference range.   Potassium 4.2  3.7 - 5.3 mEq/L   Comment: Please note change in reference range.   Chloride 100  96 - 112 mEq/L   CO2 21  19 - 32 mEq/L   Glucose, Bld 106 (*) 70 - 99 mg/dL   BUN 65 (*) 6 - 23 mg/dL   Creatinine, Ser 1.81 (*) 0.50 - 1.10 mg/dL   Comment: DELTA CHECK NOTED   Calcium 8.5  8.4 - 10.5 mg/dL   GFR calc non Af Amer 28 (*) >90 mL/min   GFR calc Af Amer 32 (*) >90 mL/min   Comment: (NOTE)     The eGFR has been calculated using the CKD EPI equation.     This calculation has not been validated in all clinical situations.     eGFR's persistently <90 mL/min signify possible Chronic Kidney     Disease.  TROPONIN I     Status: Abnormal   Collection Time    10/12/13  2:30 AM      Result Value Range   Troponin I 0.45 (*) <0.30 ng/mL   Comment:            Due to the release kinetics of cTnI,     a negative result within the first hours     of the onset of symptoms does not rule out     myocardial infarction with certainty.     If myocardial infarction is still suspected,     repeat the test at appropriate intervals.     CRITICAL VALUE NOTED.  VALUE IS CONSISTENT WITH PREVIOUSLY REPORTED AND CALLED VALUE.  HEPARIN LEVEL (UNFRACTIONATED)     Status: None   Collection Time    10/12/13  2:30 AM      Result Value Range   Heparin Unfractionated 0.48  0.30 - 0.70 IU/mL   Comment:  IF HEPARIN RESULTS ARE BELOW     EXPECTED VALUES, AND PATIENT     DOSAGE HAS BEEN CONFIRMED,     SUGGEST FOLLOW UP TESTING     OF ANTITHROMBIN III LEVELS.    Imaging: Dg Chest Port 1 View  10/12/2013   CLINICAL DATA:  Followup right lower lobe pneumonia  EXAM: PORTABLE CHEST - 1 VIEW  COMPARISON:  Prior chest x-ray 10/11/2013  FINDINGS: Improved pulmonary edema and  inspiratory volumes. Persistent right basilar infiltrate. Mild cardiomegaly. No large pleural effusion. No pneumothorax.  IMPRESSION: 1. Interval resolution of mild interstitial pulmonary edema. 2. Persistent right basilar infiltrate without significant interval change.   Electronically Signed   By: Jacqulynn Cadet [name]   On: 10/12/2013 08:50   Dg Chest Port 1 View  10/11/2013   CLINICAL DATA:  Shortness of breath.  EXAM: PORTABLE CHEST - 1 VIEW  COMPARISON:  None.  FINDINGS: Progressive asymmetric airspace disease greater on right. This may represent pulmonary edema. Right lower lobe infectious infiltrate may also be present. Clinical correlation and followup until clearance recommended.  Cardiomegaly with prominent mitral valve calcifications.  Mildly tortuous aorta.  No gross pneumothorax.  IMPRESSION: Progressive asymmetric airspace disease greater on right. This may represent pulmonary edema. Right lower lobe infectious infiltrate may also be present. Clinical correlation and followup until clearance recommended.   Electronically Signed   By: Chauncey Cruel [name]   On: 10/11/2013 07:14   Dg Chest Portable 1 View  10/10/2013   CLINICAL DATA:  Shortness of breath and emesis.  EXAM: PORTABLE CHEST - 1 VIEW  COMPARISON:  11/15/2012  FINDINGS: Patient is rotated to the left. Lungs are adequately inflated and demonstrate moderate consolidation over the right base which may be due to 8 pneumonia versus layering pleural fluid with atelectasis. Mild stable cardiomegaly. Remainder the exam is unchanged.  IMPRESSION: Moderate opacification over the right mid to lower lung which may be due to a pneumonia versus layering pleural effusion/atelectasis.  Stable cardiomegaly.   Electronically Signed   By: Marin Olp [name]   On: 10/10/2013 12:38    Assessment:  1. Principal Problem: 2.   PNA (pneumonia) 3. Active Problems: 4.   Morbid obesity 5.   CHRONIC OBSTRUCTIVE PULMONARY DISEASE, ACUTE EXACERBATION 6.    COPD 7.   Severe aortic valve stenosis 8.   CAP (community acquired pneumonia) 51.   CHF, acute on chronic 10.   Acute renal failure 11.   Acute-on-chronic respiratory failure 12.   Elevated troponin 13.   Septic shock(785.52) 14.   Plan:  1. Agree with d/c heparin today as it has been >48 hrs. Low pre-test probability for DVT, although d-dimer was elevated at admission. Would seem based on echo and clinical findings her valvular heart disease is clinically significant.  Recommend outpatient catheterization (as was planned) on 1/14 with Dr. Loletha Grayer for pre-operative purposes. We've had a number of discussions about her valves and she realizes that they will need to be replaced soon.  Creatinine continues to improve, but not yet at baseline. BNP is down to 8290, from >70K. Leukocytosis has resolved.   Time Spent Directly with Patient:  15 minutes  Length of Stay:  LOS: 2 days   Pixie Casino, MD, [facility] Attending Cardiologist [facility]  HILTY,Kenneth C 10/12/2013, 11:07 AM

## 2013-10-12 NOTE — Progress Notes (Addendum)
Pt transferred to 5W36. Pt transferred via wheelchair, husband present for transfer. Pt transported on tele, room air. Pt belongings (glasses and cell phone) sent with pt transfer. Receiving RN present on arrival to new room. Pt placed on receiving units tele box. Will continue to monitor. [name]

## 2013-10-12 NOTE — Progress Notes (Addendum)
[place] RN/Dr Z updated pt with new flushing/reddness to upper chest and left cheek. Pt denies itching or discomfort. Pt did receive scheduled antibiotics at ~1330, not new orders, pt has been receiving zithromax and rocephin. Will continue to monitor. Reather Laurence

## 2013-10-12 NOTE — Progress Notes (Signed)
10/12/13 Patient transferring from 2S. Alert and oriented, home with husband, has 2 IV sites, LFA has D 5 .45 NS at [facility], recent F/C removed. Currently on RA, Tele  NSR #[id number].

## 2013-10-13 DIAGNOSIS — J13 Pneumonia due to Streptococcus pneumoniae: Secondary | ICD-10-CM

## 2013-10-13 LAB — CBC
HCT: 33.5 % — ABNORMAL LOW (ref 36.0–46.0)
Hemoglobin: 11.3 g/dL — ABNORMAL LOW (ref 12.0–15.0)
MCH: 30.8 pg (ref 26.0–34.0)
MCHC: 33.7 g/dL (ref 30.0–36.0)
MCV: 91.3 fL (ref 78.0–100.0)
Platelets: 111 10*3/uL — ABNORMAL LOW (ref 150–400)
RBC: 3.67 MIL/uL — ABNORMAL LOW (ref 3.87–5.11)
RDW: 14.3 % (ref 11.5–15.5)
WBC: 10.6 10*3/uL — ABNORMAL HIGH (ref 4.0–10.5)

## 2013-10-13 LAB — BASIC METABOLIC PANEL
BUN: 53 mg/dL — ABNORMAL HIGH (ref 6–23)
CO2: 23 mEq/L (ref 19–32)
Calcium: 8.6 mg/dL (ref 8.4–10.5)
Chloride: 103 mEq/L (ref 96–112)
Creatinine, Ser: 1.33 mg/dL — ABNORMAL HIGH (ref 0.50–1.10)
GFR calc Af Amer: 46 mL/min — ABNORMAL LOW (ref >90)
GFR calc non Af Amer: 40 mL/min — ABNORMAL LOW (ref >90)
Glucose, Bld: 104 mg/dL — ABNORMAL HIGH (ref 70–99)
Potassium: 4.2 mEq/L (ref 3.7–5.3)
Sodium: 136 mEq/L — ABNORMAL LOW (ref 137–147)

## 2013-10-13 LAB — MAGNESIUM: Magnesium: 2.1 mg/dL (ref 1.5–2.5)

## 2013-10-13 LAB — PHOSPHORUS: Phosphorus: 3.1 mg/dL (ref 2.3–4.6)

## 2013-10-13 MED ORDER — DILTIAZEM HCL ER COATED BEADS 120 MG PO CP24
120.0000 mg | ORAL_CAPSULE | Freq: Every day | ORAL | Status: DC
  Administered 2013-10-13 – 2013-10-15 (×3): 120 mg via ORAL
  Filled 2013-10-13 (×3): qty 1

## 2013-10-13 MED ORDER — ZOLPIDEM TARTRATE 5 MG PO TABS: 5.0000 mg | ORAL_TABLET | Freq: Every evening | ORAL | Status: DC | PRN

## 2013-10-13 MED ORDER — ZOLPIDEM TARTRATE 5 MG PO TABS
5.0000 mg | ORAL_TABLET | Freq: Once | ORAL | Status: AC
  Administered 2013-10-13: 5 mg via ORAL
  Filled 2013-10-13: qty 1

## 2013-10-13 NOTE — Progress Notes (Signed)
Name: [name] MRN: [id number] DOB: January 10, 1946    ADMISSION DATE:  10/10/2013 CONSULTATION DATE:  10/10/2013  REFERRING MD :  EDP PRIMARY SERVICE: PCCM  CHIEF COMPLAINT:  Shortness of breath  BRIEF PATIENT DESCRIPTION: 68 y/o female with rheumatic valve disease-severe AS  and COPD was admitted on 1/1 with acute respiratory failure from severe CAP requiring BIPAP.  SIGNIFICANT EVENTS / STUDIES:  TTE 1/2 > normal LV fxn, severe AS, severe MV calcification, mildly dilated RV  LINES / TUBES:   CULTURES: 1/1 blood >> 1/1 ur strep >>POS 1/1 ur leg >> 1/1 flu >> negative  ANTIBIOTICS: 1/1 ceftriaxone >> 1/1 azithro >>  SUBJECTIVE:  Pt sitting in chair on room air with no increased wob.  Denies any new complaints.   VITAL SIGNS: Temp:  [97.1 F (36.2 C)-98.1 F (36.7 C)] 97.4 F (36.3 C) (01/04 0636) Pulse Rate:  [77-93] 77 (01/04 0601) Resp:  [18-22] 20 (01/04 0601) BP: (108-140)/(49-69) 133/69 mmHg (01/04 0601) SpO2:  [93 %-99 %] 96 % (01/04 0854) FiO2 (%):  [21 %] 21 % (01/04 0854) Weight:  [106.187 kg (234 lb 1.6 oz)-107.366 kg (236 lb 11.2 oz)] 106.187 kg (234 lb 1.6 oz) (01/04 0601) HEMODYNAMICS:   VENTILATOR SETTINGS: Vent Mode:  [-]  FiO2 (%):  [21 %] 21 % INTAKE / OUTPUT: Intake/Output     01/03 0701 - 01/04 0700 01/04 0701 - 01/05 0700   P.O. 680    I.V. (mL/kg) 385 (3.6) 22.5 (0.2)   IV Piggyback 300    Total Intake(mL/kg) 1365 (12.9) 22.5 (0.2)   Urine (mL/kg/hr) 825 (0.3)    Total Output 825     Net +540 +22.5        Urine Occurrence 3 x    Stool Occurrence 1 x      PHYSICAL EXAMINATION: Gen: ow female in nad HEENT: nose without purulence, op clear, neck without LN or TMG PULM: crackles 1/3 up bilat, a few rhonchi CV: RRR, 3/6 sem AB: BS+, soft, nontender Ext: warm, no edema,  no cyanosis Derm: no rash or skin breakdown Neuro: A&Ox4, moves all 4.   LABS:  CBC  Recent Labs Lab 10/11/13 0325 10/12/13 0230 10/13/13 0525  WBC  18.1* 10.1 10.6*  HGB 12.1 11.1* 11.3*  HCT 36.3 32.4* 33.5*  PLT 168 PLATELET CLUMPS NOTED ON SMEAR, COUNT APPEARS DECREASED 111*   Coag's No results found for this basename: APTT, INR,  in the last 168 hours BMET  Recent Labs Lab 10/11/13 0325 10/12/13 0230 10/13/13 0525  NA 138 136* 136*  K 4.4 4.2 4.2  CL 100 100 103  CO2 22 21 23   BUN 63* 65* 53*  CREATININE 2.93* 1.81* 1.33*  GLUCOSE 159* 106* 104*   Electrolytes  Recent Labs Lab 10/11/13 0325 10/12/13 0230 10/13/13 0525  CALCIUM 8.0* 8.5 8.6  MG  --   --  2.1  PHOS  --   --  3.1   Sepsis Markers No results found for this basename: LATICACIDVEN, PROCALCITON, O2SATVEN,  in the last 168 hours ABG  Recent Labs Lab 10/10/13 1802  PHART 7.344*  PCO2ART 29.9*  PO2ART 79.0*   Liver Enzymes  Recent Labs Lab 10/10/13 1127  AST 24  ALT 12  ALKPHOS 125*  BILITOT 1.2  ALBUMIN 3.1*   Cardiac Enzymes  Recent Labs Lab 10/10/13 1312 10/11/13 1038 10/12/13 0230  TROPONINI 0.34* 0.53* 0.45*  PROBNP >70000.0*  --  8290.0*   Glucose No results  found for this basename: GLUCAP,  in the last 168 hours  Imaging Dg Chest Port 1 View  10/12/2013   CLINICAL DATA:  Followup right lower lobe pneumonia  EXAM: PORTABLE CHEST - 1 VIEW  COMPARISON:  Prior chest x-ray 10/11/2013  FINDINGS: Improved pulmonary edema and inspiratory volumes. Persistent right basilar infiltrate. Mild cardiomegaly. No large pleural effusion. No pneumothorax.  IMPRESSION: 1. Interval resolution of mild interstitial pulmonary edema. 2. Persistent right basilar infiltrate without significant interval change.   Electronically Signed   By: Jacqulynn Cadet [name]   On: 10/12/2013 08:50       ASSESSMENT / PLAN:  PULMONARY A: Severe RLL CAP  Acute respiratory failure from pneumonia and possibly asthma exac, pulm edema P:   -taper steroids as tolerated.  -scheduled bronchodilators -?change to oral abx in am.    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   RENAL A:  AKI > presumably pre-renal or ATN Renal function better today.   P:   -gentle IVF -monitor UOP -serial BMET   INFECTIOUS A:  Severe CAP, Pneumococcal P:   -continue ceftriaxone and azithro

## 2013-10-13 NOTE — Evaluation (Signed)
Physical Therapy Evaluation Patient Details Name: [name] MRN: [id number] DOB: 1946-04-13 Today's Date: 10/13/2013 Time: 4580-9983 PT Time Calculation (min): 18 min  PT Assessment / Plan / Recommendation History of Present Illness  Pt with CAP resulting in complex hospital course.  Clinical Impression  Pt admitted with CAP. Pt currently with functional limitations due to the deficits listed below (see PT Problem List). Complex hospital course resulting in deconditioning/generalized weakness. Pt will benefit from skilled PT to increase their independence and safety with mobility to allow discharge to the venue listed below. Pt prefers use of QC for ambulation.  She is unsure if she still has her QC at home.  Her husband will check.  If not, she will need one for home upon d/c.      PT Assessment  Patient needs continued PT services    Follow Up Recommendations  Home health PT    Does the patient have the potential to tolerate intense rehabilitation      Barriers to Discharge        Equipment Recommendations  Other (comment) (quad cane)    Recommendations for Other Services     Frequency Min 3X/week    Precautions / Restrictions Precautions Precautions: None Restrictions Weight Bearing Restrictions: No   Pertinent Vitals/Pain 0/10      Mobility  Bed Mobility Bed Mobility: Supine to Sit Supine to Sit: 5: Supervision;HOB elevated;With rails Transfers Transfers: Sit to Stand;Stand to Sit Sit to Stand: 4: Min guard;From bed;With upper extremity assist Stand to Sit: 4: Min guard;With armrests;To chair/3-in-1 Details for Transfer Assistance: verbal cues for hand placement Ambulation/Gait Ambulation/Gait Assistance: 4: Min assist Ambulation Distance (Feet): 50 Feet Assistive device: 1 person hand held assist Gait Pattern: Narrow base of support;Decreased stride length Gait velocity: decreased    Exercises     PT Diagnosis: Difficulty walking;Generalized  weakness  PT Problem List: Decreased strength;Decreased activity tolerance;Decreased balance;Decreased mobility PT Treatment Interventions: DME instruction;Gait training;Stair training;Functional mobility training;Therapeutic activities;Therapeutic exercise;Patient/family education;Balance training     PT Goals(Current goals can be found in the care plan section) Acute Rehab PT Goals Patient Stated Goal: home PT Goal Formulation: With patient Time For Goal Achievement: 10/20/13 Potential to Achieve Goals: Good  Visit Information  Last PT Received On: 10/13/13 Assistance Needed: +1 History of Present Illness: Pt with CAP resulting in complex hospital course.       Prior [place] expects to be discharged to:: Private residence Living Arrangements: Spouse/significant other Available Help at Discharge: Family;Available 24 hours/day Type of Home: House Home Access: Stairs to enter [employer] of Steps: 2 Entrance Stairs-Rails: Right Home Layout: One level Home Equipment: None Prior Function Level of Independence: Independent Communication Communication: No difficulties    Cognition  Cognition Arousal/Alertness: Awake/alert Behavior During Therapy: WFL for tasks assessed/performed Overall Cognitive Status: Within Functional Limits for tasks assessed    Extremity/Trunk Assessment     Balance    End of Session PT - End of Session Equipment Utilized During Treatment: Gait belt Activity Tolerance: Patient tolerated treatment well Patient left: in chair;with call bell/phone within reach Nurse Communication: Mobility status  GP     Lorriane Shire 10/13/2013, 11:54 AM  Lorrin Goodell, PT  Office # [phone] Pager [phone]

## 2013-10-13 NOTE — Progress Notes (Signed)
PULMONARY / CRITICAL CARE MEDICINE   Name: [name] MRN: [id number] DOB: Sep 21, 1946    ADMISSION DATE:  10/10/2013 CONSULTATION DATE:  10/10/2013  REFERRING MD :  EDP PRIMARY SERVICE: PCCM  CHIEF COMPLAINT:  Shortness of breath  BRIEF PATIENT DESCRIPTION: 68 yo with rheumatic valve disease / severe AS  and COPD admitted on 1/1 with acute respiratory failure from severe CAP requiring BIPAP.  SIGNIFICANT EVENTS / STUDIES:  1/2  TTE >>> EF 65%, severe AS, severe MV calcification, mildly dilated RV  LINES / TUBES:  CULTURES: 1/1 Blood >>> 1/1 Strep Ag >>> POSITIVE 1/1 Legionella Ag >>> 1/1 Flu >>> neg  ANTIBIOTICS: Ceftriaxone 1/1>>> 1/5 Azithromycin 1/1>>> 1/5 Levaquin 1/5 >>>  SUBJECTIVE:  Reports being tired.  Desaturated while walking off oxygen.   VITAL SIGNS: Temp:  [97.1 F (36.2 C)-97.9 F (36.6 C)] 97.9 F (36.6 C) (01/04 2100) Pulse Rate:  [76-82] 76 (01/04 2100) Resp:  [20] 20 (01/04 2100) BP: (133-149)/(69-73) 134/71 mmHg (01/04 2100) SpO2:  [94 %-96 %] 94 % (01/04 2100) FiO2 (%):  [21 %] 21 % (01/04 1531) Weight:  [106.187 kg (234 lb 1.6 oz)] 106.187 kg (234 lb 1.6 oz) (01/04 0601) HEMODYNAMICS:   VENTILATOR SETTINGS: Vent Mode:  [-]  FiO2 (%):  [21 %] 21 % INTAKE / OUTPUT: Intake/Output     01/04 0701 - 01/05 0700   P.O.    I.V. (mL/kg) 127.2 (1.2)   IV Piggyback 300   Total Intake(mL/kg) 427.2 (4)   Urine (mL/kg/hr)    Total Output     Net +427.2         PHYSICAL EXAMINATION: Gen: Resting comfortable, no distress HEENT: PERRL PULM: Expiratory wheezes, rales CV: RRR, 3/6 sem AB: Obese, soft Ext: No edema Derm: No rash Neuro: Alert, oriented, somewhat anxious   CBC  Recent Labs Lab 10/11/13 0325 10/12/13 0230 10/13/13 0525  WBC 18.1* 10.1 10.6*  HGB 12.1 11.1* 11.3*  HCT 36.3 32.4* 33.5*  PLT 168 PLATELET CLUMPS NOTED ON SMEAR, COUNT APPEARS DECREASED 111*   Coag's No results found for this basename: APTT, INR,  in the  last 168 hours BMET  Recent Labs Lab 10/11/13 0325 10/12/13 0230 10/13/13 0525  NA 138 136* 136*  K 4.4 4.2 4.2  CL 100 100 103  CO2 22 21 23   BUN 63* 65* 53*  CREATININE 2.93* 1.81* 1.33*  GLUCOSE 159* 106* 104*   Electrolytes  Recent Labs Lab 10/11/13 0325 10/12/13 0230 10/13/13 0525  CALCIUM 8.0* 8.5 8.6  MG  --   --  2.1  PHOS  --   --  3.1   Sepsis Markers No results found for this basename: LATICACIDVEN, PROCALCITON, O2SATVEN,  in the last 168 hours ABG  Recent Labs Lab 10/10/13 1802  PHART 7.344*  PCO2ART 29.9*  PO2ART 79.0*   Liver Enzymes  Recent Labs Lab 10/10/13 1127  AST 24  ALT 12  ALKPHOS 125*  BILITOT 1.2  ALBUMIN 3.1*   Cardiac Enzymes  Recent Labs Lab 10/10/13 1312 10/11/13 1038 10/12/13 0230  TROPONINI 0.34* 0.53* 0.45*  PROBNP >70000.0*  --  8290.0*   Glucose No results found for this basename: GLUCAP,  in the last 168 hours  CXR:  None today.  ASSESSMENT / PLAN:  PULMONARY A:  Pneumococcal pneumonia. AECOPD. Pulmonary edema? Acute respiratory failure - resolved. P:   Goal SpO2>92 Supplemental oxygen PRN  Duoneb D/c Solu-Medrol Start Medrol Restart Spiriva and Breo Ellipta Repeat CXR Ambulatory SpO2,  may need home oxygen   CARDIOVASCULAR A:  Severe Rheumatic heart disease with MS and AS. NSTEMI vs demand ischemia. P:  Cardiology following Restart ASA, Mevacor, Cardizem Hold Losartan until renal function improves  RENAL A:  AKI. Positive fluid balance. P: Trend BMET Lasix 40 q12h x 2 doses  GASTROINTESTINAL A:   Nutrition. GERD. P:   Diet Protonix  HEMATOLOGIC A:   Anemia. Thrombocytopenia. VTE Px. P:  Trend CBC  INFECTIOUS A: Pneumococcal pneumonia. P:   Simplify abx to Levaquin  ENDOCRINE  A:  No active issues. P:   No intervention required  NEUROLOGIC A:   No active issues. P:   No intervention required  I have personally obtained history, examined patient,  evaluated and interpreted laboratory and imaging results, reviewed medical records, formulated assessment / plan and placed orders.  Doree Fudge, MD Pulmonary and [place] Pager: [phone]  10/13/2013, 10:08 PM

## 2013-10-13 NOTE — Progress Notes (Signed)
DAILY PROGRESS NOTE  Subjective:  No events overnight. BP stable. No clinical signs of heart failure. Creatinine continues to improve. Lying flat, not short of breath.  Objective:  Temp:  [97.1 F (36.2 C)-98.1 F (36.7 C)] 97.4 F (36.3 C) (01/04 0636) Pulse Rate:  [77-93] 77 (01/04 0601) Resp:  [18-22] 20 (01/04 0601) BP: (108-140)/(49-69) 133/69 mmHg (01/04 0601) SpO2:  [93 %-99 %] 96 % (01/04 0601) Weight:  [234 lb 1.6 oz (106.187 kg)-236 lb 11.2 oz (107.366 kg)] 234 lb 1.6 oz (106.187 kg) (01/04 0601) Weight change: 9 lb 6.5 oz (4.266 kg)  Intake/Output from previous day: 01/03 0701 - 01/04 0700 In: 1365 [P.O.:680; I.V.:385; IV Piggyback:300] Out: 825 [Urine:825]  Intake/Output from this shift:    Medications: Current Facility-Administered Medications  Medication Dose Route Frequency Provider Last Rate Last Dose  . aspirin EC tablet 81 mg  81 mg Oral Daily Juanito Doom, MD   81 mg at 10/12/13 1008  . azithromycin (ZITHROMAX) 500 mg in dextrose 5 % 250 mL IVPB  500 mg Intravenous Q24H Elsie Stain, MD   500 mg at 10/12/13 1322  . cefTRIAXone (ROCEPHIN) 1 g in dextrose 5 % 50 mL IVPB  1 g Intravenous Q24H Patsey Berthold Brandon, RPH   1 g at 10/12/13 1322  . dextrose 5 %-0.45 % sodium chloride infusion   Intravenous Continuous Collene Gobble, MD 10 mL/hr at 10/12/13 1100 10 mL/hr at 10/12/13 1100  . ipratropium-albuterol (DUONEB) 0.5-2.5 (3) MG/3ML nebulizer solution 3 mL  3 mL Nebulization QID Collene Gobble, MD   3 mL at 10/12/13 2146  . lovastatin (MEVACOR) tablet 40 mg  40 mg Oral q1800 Juanito Doom, MD   40 mg at 10/12/13 1743  . methylPREDNISolone sodium succinate (SOLU-MEDROL) 40 mg/mL injection 40 mg  40 mg Intravenous Q24H Rigoberto Noel, MD   40 mg at 10/13/13 1610  . pantoprazole (PROTONIX) EC tablet 40 mg  40 mg Oral QHS Elsie Stain, MD   40 mg at 10/12/13 2204    Physical Exam: General appearance: alert and no distress Neck: no carotid  bruit and no JVD Lungs: diminished breath sounds bibasilar Heart: regular rate and rhythm, S1: normal, S2: decreased intensity and systolic murmur: late systolic 3/6, harsh at 2nd right intercostal space  Lab Results: Results for orders placed during the hospital encounter of 10/10/13 (from the past 48 hour(s))  TROPONIN I     Status: Abnormal   Collection Time    10/11/13 10:38 AM      Result Value Range   Troponin I 0.53 (*) <0.30 ng/mL   Comment:            Due to the release kinetics of cTnI,     a negative result within the first hours     of the onset of symptoms does not rule out     myocardial infarction with certainty.     If myocardial infarction is still suspected,     repeat the test at appropriate intervals.     CRITICAL VALUE NOTED.  VALUE IS CONSISTENT WITH PREVIOUSLY REPORTED AND CALLED VALUE.  CK TOTAL AND CKMB     Status: Abnormal   Collection Time    10/11/13 10:38 AM      Result Value Range   Total CK 112  7 - 177 U/L   CK, MB 4.6 (*) 0.3 - 4.0 ng/mL   Relative Index 4.1 (*) 0.0 -  2.5  HEPARIN LEVEL (UNFRACTIONATED)     Status: None   Collection Time    10/11/13  3:05 PM      Result Value Range   Heparin Unfractionated 0.34  0.30 - 0.70 IU/mL   Comment:            IF HEPARIN RESULTS ARE BELOW     EXPECTED VALUES, AND PATIENT     DOSAGE HAS BEEN CONFIRMED,     SUGGEST FOLLOW UP TESTING     OF ANTITHROMBIN III LEVELS.  CBC     Status: Abnormal   Collection Time    10/12/13  2:30 AM      Result Value Range   WBC 10.1  4.0 - 10.5 K/uL   RBC 3.53 (*) 3.87 - 5.11 MIL/uL   Hemoglobin 11.1 (*) 12.0 - 15.0 g/dL   HCT 32.4 (*) 36.0 - 46.0 %   MCV 91.8  78.0 - 100.0 fL   MCH 31.4  26.0 - 34.0 pg   MCHC 34.3  30.0 - 36.0 g/dL   RDW 14.4  11.5 - 15.5 %   Platelets    150 - 400 K/uL   Value: PLATELET CLUMPS NOTED ON SMEAR, COUNT APPEARS DECREASED  PRO B NATRIURETIC PEPTIDE     Status: Abnormal   Collection Time    10/12/13  2:30 AM      Result Value Range     Pro B Natriuretic peptide (BNP) 8290.0 (*) 0 - 125 pg/mL  BASIC METABOLIC PANEL     Status: Abnormal   Collection Time    10/12/13  2:30 AM      Result Value Range   Sodium 136 (*) 137 - 147 mEq/L   Comment: Please note change in reference range.   Potassium 4.2  3.7 - 5.3 mEq/L   Comment: Please note change in reference range.   Chloride 100  96 - 112 mEq/L   CO2 21  19 - 32 mEq/L   Glucose, Bld 106 (*) 70 - 99 mg/dL   BUN 65 (*) 6 - 23 mg/dL   Creatinine, Ser 1.81 (*) 0.50 - 1.10 mg/dL   Comment: DELTA CHECK NOTED   Calcium 8.5  8.4 - 10.5 mg/dL   GFR calc non Af Amer 28 (*) >90 mL/min   GFR calc Af Amer 32 (*) >90 mL/min   Comment: (NOTE)     The eGFR has been calculated using the CKD EPI equation.     This calculation has not been validated in all clinical situations.     eGFR's persistently <90 mL/min signify possible Chronic Kidney     Disease.  TROPONIN I     Status: Abnormal   Collection Time    10/12/13  2:30 AM      Result Value Range   Troponin I 0.45 (*) <0.30 ng/mL   Comment:            Due to the release kinetics of cTnI,     a negative result within the first hours     of the onset of symptoms does not rule out     myocardial infarction with certainty.     If myocardial infarction is still suspected,     repeat the test at appropriate intervals.     CRITICAL VALUE NOTED.  VALUE IS CONSISTENT WITH PREVIOUSLY REPORTED AND CALLED VALUE.  HEPARIN LEVEL (UNFRACTIONATED)     Status: None   Collection Time    10/12/13  2:30 AM  Result Value Range   Heparin Unfractionated 0.48  0.30 - 0.70 IU/mL   Comment:            IF HEPARIN RESULTS ARE BELOW     EXPECTED VALUES, AND PATIENT     DOSAGE HAS BEEN CONFIRMED,     SUGGEST FOLLOW UP TESTING     OF ANTITHROMBIN III LEVELS.  BASIC METABOLIC PANEL     Status: Abnormal   Collection Time    10/13/13  5:25 AM      Result Value Range   Sodium 136 (*) 137 - 147 mEq/L   Comment: Please note change in reference  range.   Potassium 4.2  3.7 - 5.3 mEq/L   Comment: Please note change in reference range.   Chloride 103  96 - 112 mEq/L   CO2 23  19 - 32 mEq/L   Glucose, Bld 104 (*) 70 - 99 mg/dL   BUN 53 (*) 6 - 23 mg/dL   Creatinine, Ser 1.33 (*) 0.50 - 1.10 mg/dL   Calcium 8.6  8.4 - 10.5 mg/dL   GFR calc non Af Amer 40 (*) >90 mL/min   GFR calc Af Amer 46 (*) >90 mL/min   Comment: (NOTE)     The eGFR has been calculated using the CKD EPI equation.     This calculation has not been validated in all clinical situations.     eGFR's persistently <90 mL/min signify possible Chronic Kidney     Disease.  MAGNESIUM     Status: None   Collection Time    10/13/13  5:25 AM      Result Value Range   Magnesium 2.1  1.5 - 2.5 mg/dL  PHOSPHORUS     Status: None   Collection Time    10/13/13  5:25 AM      Result Value Range   Phosphorus 3.1  2.3 - 4.6 mg/dL  CBC     Status: Abnormal   Collection Time    10/13/13  5:25 AM      Result Value Range   WBC 10.6 (*) 4.0 - 10.5 K/uL   RBC 3.67 (*) 3.87 - 5.11 MIL/uL   Hemoglobin 11.3 (*) 12.0 - 15.0 g/dL   HCT 33.5 (*) 36.0 - 46.0 %   MCV 91.3  78.0 - 100.0 fL   MCH 30.8  26.0 - 34.0 pg   MCHC 33.7  30.0 - 36.0 g/dL   RDW 14.3  11.5 - 15.5 %   Platelets 111 (*) 150 - 400 K/uL   Comment: CONSISTENT WITH PREVIOUS RESULT    Imaging: Dg Chest Port 1 View  10/12/2013   CLINICAL DATA:  Followup right lower lobe pneumonia  EXAM: PORTABLE CHEST - 1 VIEW  COMPARISON:  Prior chest x-ray 10/11/2013  FINDINGS: Improved pulmonary edema and inspiratory volumes. Persistent right basilar infiltrate. Mild cardiomegaly. No large pleural effusion. No pneumothorax.  IMPRESSION: 1. Interval resolution of mild interstitial pulmonary edema. 2. Persistent right basilar infiltrate without significant interval change.   Electronically Signed   By: Jacqulynn Cadet [name]   On: 10/12/2013 08:50    Assessment:  1. Principal Problem: 2.   Pneumococcal pneumonia (streptococcus  pneumoniae pneumonia) 3. Active Problems: 4.   Morbid obesity 5.   CHRONIC OBSTRUCTIVE PULMONARY DISEASE, ACUTE EXACERBATION 6.   COPD 7.   Severe aortic valve stenosis 8.   CAP (community acquired pneumonia) 53.   CHF, acute on chronic 10.   Acute renal failure 11.  Acute-on-chronic respiratory failure 12.   Elevated troponin 13.   Septic shock(785.52) 14.   Plan:  1. Substantial clinical improvement. Renal function continues to improve, closing in on baseline. Lying flat without CHF symptoms. Continue antibiotics and care per PCCM.  Plan for [facility] outline in yesterday's note with Dr. Sallyanne Kuster as an outpatient.  Would hold on restarting cozaar until renal function recovers. Could add back low dose diltiazem for HR/BP control at this point.  Will sign-off. Call with questions.  Time Spent Directly with Patient:  15 minutes  Length of Stay:  LOS: 3 days   Pixie Casino, MD, [facility] Attending Cardiologist [facility]  Angalina Ante C 10/13/2013, 8:37 AM

## 2013-10-14 ENCOUNTER — Encounter: Payer: Self-pay | Admitting: Cardiovascular Disease

## 2013-10-14 LAB — BASIC METABOLIC PANEL
BUN: 41 mg/dL — ABNORMAL HIGH (ref 6–23)
CO2: 23 mEq/L (ref 19–32)
Calcium: 9 mg/dL (ref 8.4–10.5)
Chloride: 102 mEq/L (ref 96–112)
Creatinine, Ser: 1.14 mg/dL — ABNORMAL HIGH (ref 0.50–1.10)
GFR calc Af Amer: 56 mL/min — ABNORMAL LOW (ref >90)
GFR calc non Af Amer: 48 mL/min — ABNORMAL LOW (ref >90)
Glucose, Bld: 104 mg/dL — ABNORMAL HIGH (ref 70–99)
Potassium: 4.6 mEq/L (ref 3.7–5.3)
Sodium: 139 mEq/L (ref 137–147)

## 2013-10-14 MED ORDER — METHYLPREDNISOLONE 32 MG PO TABS
32.0000 mg | ORAL_TABLET | Freq: Every day | ORAL | Status: DC
  Administered 2013-10-15: 32 mg via ORAL
  Filled 2013-10-14 (×2): qty 1

## 2013-10-14 MED ORDER — TIOTROPIUM BROMIDE MONOHYDRATE 18 MCG IN CAPS
18.0000 ug | ORAL_CAPSULE | Freq: Every day | RESPIRATORY_TRACT | Status: DC
  Administered 2013-10-15: 18 ug via RESPIRATORY_TRACT
  Filled 2013-10-14: qty 5

## 2013-10-14 MED ORDER — FLUTICASONE FUROATE-VILANTEROL 100-25 MCG/INH IN AEPB: 1.0000 | INHALATION_SPRAY | Freq: Every day | RESPIRATORY_TRACT | Status: DC

## 2013-10-14 MED ORDER — FUROSEMIDE 10 MG/ML IJ SOLN
40.0000 mg | Freq: Two times a day (BID) | INTRAMUSCULAR | Status: DC
  Administered 2013-10-14: 40 mg via INTRAVENOUS
  Filled 2013-10-14: qty 4

## 2013-10-14 MED ORDER — LEVOFLOXACIN 750 MG PO TABS
750.0000 mg | ORAL_TABLET | Freq: Every day | ORAL | Status: DC
  Administered 2013-10-14: 750 mg via ORAL
  Filled 2013-10-14 (×2): qty 1

## 2013-10-14 NOTE — Progress Notes (Signed)
Pt said she is aggravated with the new order of her getting IV lasix. Pt given education about lasix and seemed to understand. Will continue to monitor.

## 2013-10-15 ENCOUNTER — Other Ambulatory Visit: Payer: Self-pay | Admitting: *Deleted

## 2013-10-15 ENCOUNTER — Encounter (HOSPITAL_COMMUNITY): Admission: RE | Admit: 2013-10-15 | Payer: Medicare Other | Source: Ambulatory Visit

## 2013-10-15 ENCOUNTER — Encounter: Payer: Self-pay | Admitting: Cardiovascular Disease

## 2013-10-15 ENCOUNTER — Inpatient Hospital Stay (HOSPITAL_COMMUNITY): Payer: Medicare Other

## 2013-10-15 DIAGNOSIS — R079 Chest pain, unspecified: Secondary | ICD-10-CM

## 2013-10-15 LAB — BASIC METABOLIC PANEL
BUN: 36 mg/dL — ABNORMAL HIGH (ref 6–23)
CO2: 26 mEq/L (ref 19–32)
Calcium: 8.8 mg/dL (ref 8.4–10.5)
Chloride: 99 mEq/L (ref 96–112)
Creatinine, Ser: 1.12 mg/dL — ABNORMAL HIGH (ref 0.50–1.10)
GFR calc Af Amer: 57 mL/min — ABNORMAL LOW (ref >90)
GFR calc non Af Amer: 49 mL/min — ABNORMAL LOW (ref >90)
Glucose, Bld: 103 mg/dL — ABNORMAL HIGH (ref 70–99)
Potassium: 4 mEq/L (ref 3.7–5.3)
Sodium: 137 mEq/L (ref 137–147)

## 2013-10-15 LAB — CBC
HCT: 35.6 % — ABNORMAL LOW (ref 36.0–46.0)
Hemoglobin: 12.2 g/dL (ref 12.0–15.0)
MCH: 31.4 pg (ref 26.0–34.0)
MCHC: 34.3 g/dL (ref 30.0–36.0)
MCV: 91.5 fL (ref 78.0–100.0)
Platelets: 130 10*3/uL — ABNORMAL LOW (ref 150–400)
RBC: 3.89 MIL/uL (ref 3.87–5.11)
RDW: 14.8 % (ref 11.5–15.5)
WBC: 13.4 10*3/uL — ABNORMAL HIGH (ref 4.0–10.5)

## 2013-10-15 MED ORDER — METHYLPREDNISOLONE 4 MG PO KIT: PACK | ORAL | Status: DC

## 2013-10-15 MED ORDER — LEVOFLOXACIN 750 MG PO TABS: 750.0000 mg | ORAL_TABLET | Freq: Every day | ORAL | Status: DC

## 2013-10-15 NOTE — Progress Notes (Signed)
Pt back from radiology. Alert and oriented. Will continue to monitor.

## 2013-10-15 NOTE — Discharge Summary (Signed)
Physician Discharge Summary       Patient ID: [name] MRN: [id number] DOB/AGE: 69-Nov-1947 69 y.o.  Admit date: 10/10/2013 Discharge date: 10/15/2013  Discharge Diagnoses:  Principal Problem:   Pneumococcal pneumonia (streptococcus pneumoniae pneumonia) Active Problems:   Morbid obesity   CHRONIC OBSTRUCTIVE PULMONARY DISEASE, ACUTE EXACERBATION   COPD   Severe aortic valve stenosis   CAP (community acquired pneumonia)   CHF, acute on chronic   Acute renal failure   Acute-on-chronic respiratory failure   Elevated troponin   Septic shock(785.52)  Detailed Hospital Course:   68 y/o female with rheumatic valve disease and COPD was admitted on 1/1 with acute respiratory failure from severe CAP requiring BIPAP. She stated that she had a non-productive cough associated with chills at home several days prior to admission. This was associated with nausea and vomiting. She presented to the [facility] ED on 1/1 for worsening dyspnea and was found to have a RLL infiltrate. She was admitted to the ICU. Therapeutic interventions included: obtaining pan culture samples, empiric antibiotics, systemic steroids, scheduled BDs, and supplemental oxygen. Her cardiac work up also demonstrated abnormal troponin so cardiology was consulted. She is well known to them with h/o severe valvular disease and left and right heart cath had already been planned for the outpt setting. Her urine strep was positive.  Ultimately her respiratory status improved with the above measures as well as adding IV diuresis. Her f/u cxr as of 1/6 shows some persistent RLL airspace disease. She ambulated in the hall without desaturation event. She was deemed ready for d/c as of 1/6 with out-pt follow-up and plan as outlined below.   Discharge Plan by active diagnoses   Pneumococcal pneumonia c/b AECOPD and element of edema.  >Some residual airspace disease right base.  >walking oximetry: no desaturation. Tolerated ambulation  well.  P:  Medrol dose pak Home on Spiriva and Breo Ellipta  F/u 5-7d our office  levaquin started 1/5, complete 10d total rx   Severe Rheumatic heart disease with MS and AS.  NSTEMI vs demand ischemia.  Acute on chronic CHF (now compensated)  >cards has signed off  P:  Restart ASA, Mevacor, Cardizem  Hold Losartan until renal function improves  For left and right heart cath as out-pt on 1/14 re: valvular disease .  AKI, resolving.  P:  Trend BMET  Has chemistry scheduled at cardiology for 1/8  Nutrition.  GERD.  P:  Diet  Protonix  Anemia.  Thrombocytopenia improving .  P:  Trend CBC   Significant Hospital tests/ studies/ interventions and procedures  Consults: cardiology  SIGNIFICANT EVENTS / STUDIES:  1/2 TTE >>> EF 65%, severe AS, severe MV calcification, mildly dilated RV   LINES / TUBES:  CULTURES:  1/1 Blood >>>  1/1 Strep Ag >>> POSITIVE  1/1 Legionella Ag >>>  1/1 Flu >>> neg  ANTIBIOTICS:  Ceftriaxone 1/1>>> 1/5  Azithromycin 1/1>>> 1/5  Levaquin 1/5 >>>   Discharge Exam: BP 184/76  Pulse 71  Temp(Src) 97.6 F (36.4 C) (Oral)  Resp 18  Ht 5\' 8"  (1.727 m)  Wt 105.235 kg (232 lb)  BMI 35.28 kg/m2  SpO2 94% Room air  PHYSICAL EXAMINATION:  Gen: Resting comfortable, no distress  HEENT: PERRL  PULM: Expiratory wheezes, rales  CV: RRR, 3/6 sem  AB: Obese, soft  Ext: No edema  Derm: No rash  Neuro: Alert, oriented, somewhat anxious     Labs at discharge Lab Results  Component Value Date  CREATININE 1.12* 10/15/2013   BUN 36* 10/15/2013   NA 137 10/15/2013   K 4.0 10/15/2013   CL 99 10/15/2013   CO2 26 10/15/2013   Lab Results  Component Value Date   WBC 13.4* 10/15/2013   HGB 12.2 10/15/2013   HCT 35.6* 10/15/2013   MCV 91.5 10/15/2013   PLT 130* 10/15/2013   Lab Results  Component Value Date   ALT 12 10/10/2013   AST 24 10/10/2013   ALKPHOS 125* 10/10/2013   BILITOT 1.2 10/10/2013   Lab Results  Component Value Date   INR 1.17 08/18/2011     Current radiology studies Dg Chest 2 View  10/15/2013   CLINICAL DATA:  Followup pneumonia.  EXAM: CHEST  2 VIEW  COMPARISON:  Chest radiograph 10/12/2013.  FINDINGS: Stable mild cardiomegaly. No significant interval change heterogeneous opacities right lung base. No definite pleural effusion pneumothorax. Regional skeleton demonstrates mid thoracic spine degenerative change.  IMPRESSION: Persistent right basilar infiltrate, not significantly changed.   Electronically Signed   By: Lovey Newcomer [name]   On: 10/15/2013 07:49    Disposition:  01-Home or Self Care      Discharge Orders   Future Appointments Provider Department Dept Phone   10/17/2013 9:30 AM [facility] [place] [phone]   10/22/2013 9:30 AM [facility] [place] [phone]   10/22/2013 2:30 PM Melvenia Needles, NP [facility] [phone]   10/24/2013 9:30 AM [facility] [place] [phone]   10/29/2013 9:30 AM [facility] [place] [phone]   10/31/2013 9:30 AM [facility] [place] [phone]   12/26/2013 3:15 PM [facility] [place] [facility] [phone]   12/26/2013 3:45 PM Ladell Pier, MD [place] [phone]   Future Orders Complete By Expires   Diet - low sodium heart healthy  As directed    Increase activity slowly  As directed        Medication List    STOP taking these medications       losartan 50 MG tablet  Commonly known as:  COZAAR      TAKE these medications       albuterol 108 (90 BASE) MCG/ACT inhaler  Commonly known as:  VENTOLIN HFA  Inhale 2 puffs into the lungs every 6 (six) hours as needed for wheezing.     aspirin 81 MG tablet  Take 81 mg by mouth  at bedtime.     CALCIUM 600 + D PO  Take 1,200 mg by mouth at bedtime.     diltiazem 120 MG 24 hr capsule  Commonly known as:  CARDIZEM CD  Take 120 mg by mouth at bedtime.     Fluticasone Furoate-Vilanterol 100-25 MCG/INH Aepb  Commonly known as:  BREO ELLIPTA  Inhale 1 puff into the lungs daily.     levofloxacin 750 MG tablet  Commonly known as:  LEVAQUIN  Take 1 tablet (750 mg total) by mouth daily at 6 PM.     lovastatin 40 MG tablet  Commonly known as:  MEVACOR  Take 40 mg by mouth at bedtime.     methylPREDNISolone 4 MG tablet  Commonly known as:  MEDROL DOSEPAK  follow package directions     montelukast 10 MG tablet  Commonly known as:  SINGULAIR  Take 10 mg by  mouth at bedtime.     pantoprazole 40 MG tablet  Commonly known as:  PROTONIX  Take 40 mg by mouth at bedtime.     polyethylene glycol packet  Commonly known as:  MIRALAX / GLYCOLAX  Take 17 g by mouth daily.     tiotropium 18 MCG inhalation capsule  Commonly known as:  SPIRIVA  Place 18 mcg into inhaler and inhale daily.       Follow-up Information   Follow up with PARRETT,TAMMY, NP On 10/22/2013. (check in at 2 then go down for chest xray and then see Tammy at 2:30  )    Specialty:  Nurse Practitioner   Contact information:   [place] [place] [phone]      Discharged Condition: good  Physician Statement:   The Patient was personally examined, the discharge assessment and plan has been personally reviewed and I agree with ACNP Babcock's assessment and plan. > 30 minutes of time have been dedicated to discharge assessment, planning and discharge instructions.   SignedMarni Griffon 10/15/2013, 1:05 PM  Doree Fudge, MD Pulmonary and [place] Pager: [phone]

## 2013-10-15 NOTE — Care Management Note (Signed)
    Page 1 of 2   10/15/2013     11:08:06 AM   CARE MANAGEMENT NOTE 10/15/2013  Patient:  [name], [name]   Account Number:  [id number]  Date Initiated:  10/15/2013  Documentation initiated by:  Tomi Bamberger  Subjective/Objective Assessment:   dx pna, copd  admit- lives with spouse. pta indep.     Action/Plan:   pt eval- rec hhpt   Anticipated DC Date:  10/15/2013   Anticipated DC Plan:  [place]  CM consult      [facility] Choice  HOME HEALTH   Choice offered to / List presented to:  C-1 Patient        [place] arranged  [place] [facility]      [place].   Status of service:  Completed, signed off Medicare Important Message given?   (If response is "NO", the following Medicare IM given date fields will be blank) Date Medicare IM given:   Date Additional Medicare IM given:    Discharge Disposition:  [place]  Per UR Regulation:  Reviewed for med. necessity/level of care/duration of stay  If discussed at [place] of Stay Meetings, dates discussed:    Comments:  10/15/13 11:06 Tomi Bamberger RN, BSN [phone] patient lives with spouse, per physical therapy recs hhpt, patient states she has worked with [facility] in the past and would like to work with them again.  Referral made to [facility], Butch Penny notified.  Soc will begin 24-48 hrs post discharge. Patient 's husband states patient has a quad cane at home as well.  Patient for possible dc today.

## 2013-10-15 NOTE — Progress Notes (Signed)
Nsg Discharge Note  Admit Date:  10/10/2013 Discharge date: 10/15/2013   [place] to be D/C'd Home per MD order.  AVS completed.  Copy for chart, and copy for patient signed, and dated. Patient/caregiver able to verbalize understanding.  Discharge Medication:   Medication List    STOP taking these medications       losartan 50 MG tablet  Commonly known as:  COZAAR      TAKE these medications       albuterol 108 (90 BASE) MCG/ACT inhaler  Commonly known as:  VENTOLIN HFA  Inhale 2 puffs into the lungs every 6 (six) hours as needed for wheezing.     aspirin 81 MG tablet  Take 81 mg by mouth at bedtime.     CALCIUM 600 + D PO  Take 1,200 mg by mouth at bedtime.     diltiazem 120 MG 24 hr capsule  Commonly known as:  CARDIZEM CD  Take 120 mg by mouth at bedtime.     Fluticasone Furoate-Vilanterol 100-25 MCG/INH Aepb  Commonly known as:  BREO ELLIPTA  Inhale 1 puff into the lungs daily.     levofloxacin 750 MG tablet  Commonly known as:  LEVAQUIN  Take 1 tablet (750 mg total) by mouth daily at 6 PM.     lovastatin 40 MG tablet  Commonly known as:  MEVACOR  Take 40 mg by mouth at bedtime.     methylPREDNISolone 4 MG tablet  Commonly known as:  MEDROL DOSEPAK  follow package directions     montelukast 10 MG tablet  Commonly known as:  SINGULAIR  Take 10 mg by mouth at bedtime.     pantoprazole 40 MG tablet  Commonly known as:  PROTONIX  Take 40 mg by mouth at bedtime.     polyethylene glycol packet  Commonly known as:  MIRALAX / GLYCOLAX  Take 17 g by mouth daily.     tiotropium 18 MCG inhalation capsule  Commonly known as:  SPIRIVA  Place 18 mcg into inhaler and inhale daily.        Discharge Assessment: Filed Vitals:   10/15/13 1003  BP: 184/76  Pulse:   Temp:   Resp:    Skin clean, dry and intact without evidence of skin break down, no evidence of skin tears noted. IV catheter discontinued intact. Site without signs and symptoms of  complications - no redness or edema noted at insertion site, patient denies c/o pain - only slight tenderness at site.  Dressing with slight pressure applied.  D/c Instructions-Education: Discharge instructions given to patient/family with verbalized understanding. D/c education completed with patient/family including follow up instructions, medication list, d/c activities limitations if indicated, with other d/c instructions as indicated by MD - patient able to verbalize understanding, all questions fully answered. Patient instructed to return to ED, call 911, or call MD for any changes in condition.  Patient escorted via [place], and D/C home via private auto.  [place], RN 10/15/2013 1:51 PM

## 2013-10-15 NOTE — Progress Notes (Deleted)
PULMONARY / CRITICAL CARE MEDICINE   Name: [name] MRN: [id number] DOB: Nov 15, 1945    ADMISSION DATE:  10/10/2013 CONSULTATION DATE:  10/10/2013  REFERRING MD :  EDP PRIMARY SERVICE: PCCM  CHIEF COMPLAINT:  Shortness of breath  BRIEF PATIENT DESCRIPTION: 68 yo with rheumatic valve disease / severe AS  and COPD admitted on 1/1 with acute respiratory failure from severe CAP requiring BIPAP.  SIGNIFICANT EVENTS / STUDIES:  1/2  TTE >>> EF 65%, severe AS, severe MV calcification, mildly dilated RV  LINES / TUBES:  CULTURES: 1/1 Blood >>> 1/1 Strep Ag >>> POSITIVE 1/1 Legionella Ag >>>neg 1/1 Flu >>> neg  ANTIBIOTICS: Ceftriaxone 1/1>>> 1/5 Azithromycin 1/1>>> 1/5 Levaquin 1/5 >>>  SUBJECTIVE:  Reports being tired.  Desaturated while walking off oxygen.   VITAL SIGNS: Temp:  [97.2 F (36.2 C)-98 F (36.7 C)] 97.6 F (36.4 C) (01/06 0419) Pulse Rate:  [71-88] 71 (01/06 0419) Resp:  [18-20] 18 (01/06 0419) BP: (136-184)/(75-82) 184/76 mmHg (01/06 1003) SpO2:  [93 %-97 %] 94 % (01/06 0419) Weight:  [105.235 kg (232 lb)] 105.235 kg (232 lb) (01/06 0442) HEMODYNAMICS:   VENTILATOR SETTINGS:   INTAKE / OUTPUT:  Intake/Output Summary (Last 24 hours) at 10/15/13 1216 Last data filed at 10/15/13 0900  Gross per 24 hour  Intake  584.5 ml  Output   2400 ml  Net -1815.5 ml    PHYSICAL EXAMINATION: Gen: Resting comfortable, no distress HEENT: PERRL PULM: crackles right base  CV: RRR, 3/6 sem AB: Obese, soft Ext: No edema Derm: No rash Neuro: Alert, oriented, somewhat anxious    Recent Labs Lab 10/13/13 0525 10/14/13 0702 10/15/13 0541  NA 136* 139 137  K 4.2 4.6 4.0  CL 103 102 99  CO2 23 23 26   BUN 53* 41* 36*  CREATININE 1.33* 1.14* 1.12*  GLUCOSE 104* 104* 103*    Recent Labs Lab 10/12/13 0230 10/13/13 0525 10/15/13 0541  HGB 11.1* 11.3* 12.2  HCT 32.4* 33.5* 35.6*  WBC 10.1 10.6* 13.4*  PLT PLATELET CLUMPS NOTED ON SMEAR, COUNT APPEARS  DECREASED 111* 130*    CXR: persistent RLL airspace disease.   ASSESSMENT / PLAN:   Acute respiratory failure in setting of Pneumococcal pneumonia c/b AECOPD and element of edema.  >Some residual airspace disease right base.  >walking oximetry: no desaturation. Tolerated ambulation well.  P:   Medrol taper  Home on Spiriva and Breo Ellipta Ambulatory SpO2, may need home oxygen  F/u 5-7d our office levaquin started 1/5, complete 10d total rx   Severe Rheumatic heart disease with MS and AS.  NSTEMI vs demand ischemia. Acute on chronic CHF (now compensated) >cards has signed off  P:  Restart ASA, Mevacor, Cardizem Hold Losartan until renal function improves For left and right heart cath as out-pt on 1/14 re: valvular disease.   AKI, resolving.  P: Trend BMET   Nutrition. GERD. P:   Diet Protonix    Anemia. Thrombocytopenia improving . P:  Trend CBC   I have personally obtained history, examined patient, evaluated and interpreted laboratory and imaging results, reviewed medical records, formulated assessment / plan and placed orders.  Ceola Para,PETE,   10/15/2013, 12:09 PM

## 2013-10-17 ENCOUNTER — Other Ambulatory Visit: Payer: Self-pay | Admitting: Internal Medicine

## 2013-10-17 ENCOUNTER — Encounter (HOSPITAL_COMMUNITY): Payer: Medicare Other

## 2013-10-17 LAB — CULTURE, BLOOD (ROUTINE X 2)
Culture: NO GROWTH
Culture: NO GROWTH

## 2013-10-22 ENCOUNTER — Ambulatory Visit (INDEPENDENT_AMBULATORY_CARE_PROVIDER_SITE_OTHER)
Admission: RE | Admit: 2013-10-22 | Discharge: 2013-10-22 | Disposition: A | Discharge status: Home / Self Care | Payer: Medicare Other | Source: Ambulatory Visit | Attending: Adult Health | Admitting: Adult Health

## 2013-10-22 ENCOUNTER — Encounter: Payer: Self-pay | Admitting: Adult Health

## 2013-10-22 ENCOUNTER — Ambulatory Visit (INDEPENDENT_AMBULATORY_CARE_PROVIDER_SITE_OTHER): Payer: Medicare Other | Admitting: Adult Health

## 2013-10-22 ENCOUNTER — Encounter (HOSPITAL_COMMUNITY): Payer: Medicare Other

## 2013-10-22 VITALS — BP 128/66 | HR 84 | Temp 96.8°F | Ht 66.5 in | Wt 216.4 lb

## 2013-10-22 DIAGNOSIS — I509 Heart failure, unspecified: Secondary | ICD-10-CM

## 2013-10-22 DIAGNOSIS — J189 Pneumonia, unspecified organism: Secondary | ICD-10-CM

## 2013-10-22 DIAGNOSIS — J441 Chronic obstructive pulmonary disease with (acute) exacerbation: Secondary | ICD-10-CM

## 2013-10-22 NOTE — Assessment & Plan Note (Signed)
Resolving   Plan  Continue on current regimen

## 2013-10-22 NOTE — Patient Instructions (Signed)
Continue on current regimen  Follow up Dr. [name] in 3 weeks with chest xray  Please contact office for sooner follow up if symptoms do not improve or worsen or seek emergency care

## 2013-10-22 NOTE — Progress Notes (Signed)
Subjective:    Patient ID: [name], female    DOB: 03-Oct-1946, 68 y.o.   MRN: [id number]  HPI reports that she quit smoking about 37 years ago. Her smoking use included Cigarettes. She has a 15 pack-year smoking history. She has never used smokeless tobacco.  #Obese  Body mass index is 34.85 kg/(m^2). Body mass index is 35.98 kg/(m^2). - 05/03/2013 Body mass index is 35.25 kg/(m^2). on 06/25/2013      #Ex 15 pack smoker. Quit 1970s  #COPD/ASthma - Baseline CAT score 18 in May 2013 (poor score is 4 sleep quality, level 3 for dyspnea/fatigue)  #Pulmonary nodules  -  Colon Cancer during cancer Rx had CT chest Oct 2012 and pulmonary nodules noted  - October 2012: There is an irregular nodule in the right upper lobe measuring 12 mm which is unchanged from prior and has adjacent bronchiectasis also unchanged. Within the left lower lobe, there is a 4 mm pulmonary nodule (image 30) which is unchanged.  - October 2013: No change on CT chest Oct 2013   #colon Cancer  - 2012 (early) - s/p surgery and chemo  #Arctic valve stenosis  0 Dr. Rollene Fare  #Exacerbations of obstructive lung disease   - November 2013: Treatment from office with Levaquin and Medrol Dosepak  - March 2-14: Prednisone opd for flare up     OV .01/01/2013  Follows up for COPD/asthma.  - She is reporting chronic worsening of dyspnea for the last several months but the worsening is mild overall that she feels stable. In fact on COPD cat score is only 9 and is much better than baseline of 18 and also much improved compared to 38 at last visit in November 2013 when she was in an exacerbation. She says that her aortic  valve stenosis is stable and is being monitored by [facility] cardiology Dr. Rollene Fare. She feels deconditioned and is wondering if she needs to be attend pulmonary rehabilitation maintenance program. She denies any overt wheezing or worsening cough or sputum but on exam she was actively wheezing  this visit   REC  #COPD  - You have mild wheezing even though you feel well  Take prednisone 40 mg daily x 2 days, then 20mg  daily x 2 days, then 10mg  daily x 2 days, then 5mg  daily x 2 days and stop  I will re\re refer you to pulmonary rehabilitation, you can start that for maintenance program  Start N. acetylcysteine 600 mg twice a day  - [place] study published in [place] in 2014 shows it helps  - this is not to make you feel better but to to prevent recurrent bronchitis episodes  - You are more likely to get this drug at [facility] or a vitamin store then at [employer] or [place] or [employer]  - You can also get this at website [employer]  - It functions as an anti-oxidant and there are minimal/none side effects  #Followup  4-5 months with COPD cat score at followup   OV 05/03/2013  FU for copd/asthma  - Has been told that AS is severe and needs AVR. SO she has joined rehab. Has seen dr Cyndia Bent. COPD itrself is stable. No AECOPD. Continues with mdi. Feels good. Last time she was asymtoatic and we gave pred becuaes of wheeeon exam. THis timme too though baseline has occ wheeze despite spiriva and advair.  COPD CAT score is 19 but says this is stable.   HEr obesity continues to be a problem;  says she is trying not to eat and joining rehab but unable to lose weight. I counseled her about diet and she rolled her eyes when I suggested list of foods to eat and list of foods to avoid. She acknowledged that it would be difficult for her to make a change. She thinks that weight loss involves eating less. She does not realize that weight loss involves eating different which he admits is a harder challenge      Past, Family, Social reviewed: Has seen Dr Cyndia Bent for aortic stenosis and has been recommended valve replacement   #COPD  - some wheezing depsite subjective baseline state - continue spiriva  - chagne ADVAIR to BREO 1 puff daily - flu shot in fall  - continue  rehab  #Weight mgmt #WEight Management    - we discussed extensively about weight management   - follow low glycemic diet plan that I outlined for you after extensive discussion. Do not follow other plans  #ROV 2 months    OV 06/25/2013 Followup COPD and weight management of obesity  - Terms of weight management: She has not lost any weight. She flatly told me that she's not interested in the low glycemic diet because it is too difficult.  - Terms of COPD: She's not taking Spiriva and Brio. After change to Brio the COPD cat score is improved and is now 12.5. She feels a COPD status is stable. She is compliant with her medications. Especially Spiriva and Brio. She has not had a flu shot but will have it at her work at [employer] hospital. She wants Pneumovax administered today.  In terms of her attic stenosis: She does not know her surgery date and a followup with her cardiologist is pending   CAT COPD Symptom & Quality of Life Score ([place]) 0 is no burden. 5 is highest burden 09/04/2012 Exacerbation  01/01/2013  05/03/2013  06/25/2013 With breo  Never Cough -> Cough all the time 4 2 2  1.5  No phlegm in chest -> Chest is full of phlegm 4 0.5 1 0.5  No chest tightness -> Chest feels very tight 5 0.5 3 1.5  No dyspnea for 1 flight stairs/hill -> Very dyspneic for 1 flight of stairs 5 3.5 4 4   No limitations for ADL at home -> Very limited with ADL at home 5 1.5 2 2   Confident leaving home -> Not at all confident leaving home 5 0 1 0  Sleep soundly -> Do not sleep soundly because of lung condition 5 0 4 0  Lots of Energy -> No energy at all 5 1 2.5 3  TOTAL Score (max 40)  38 9 19.5 12.5    10/22/2013 [facility] follow up  Patient returns for a post hospital followup. Patient was admitted January 1 through January 6 for pneumococcal pneumonia, and COPD, exacerbation. Her condition is complicated by severe aortic valve stenosis, and acute on chronic congestive heart failure. CXR  showed RLL infiltrate.  Her urine strep was positive. Ultimately her respiratory status improved aggressive IV antibiotics, nebulized bronchodilators, IV steroids, and diuresis. Patient's troponin was elevated. She was seen by cardiology and has an outpatient catheter planned She was discharged on Levaquin and a Medrol Dosepak. She has finished both of these prescriptions. 2-D echo showed an ejection fracture of 65%, severe aortic stenosis, severe mitral valve calcification and mildly dilated. Right ventricle. Since discharge, she is feeling some better but still weak.  CXR today shows some improvement in  RLL infiltrate.  Weight is down 16 pounds. Patient says that she has no lower showed a swelling. She denies any hemoptysis, chest pain, orthopnea, PND, or fever Appetite is slowly returning. She has no nausea, vomiting, diarrhea.   Review of Systems  Constitutional: Negative for fever and unexpected weight change.  HENT: Negative for ear pain, nosebleeds, congestion, sore throat, rhinorrhea, sneezing, trouble swallowing, dental problem, postnasal drip and sinus pressure.   Eyes: Negative for redness and itching.  Respiratory: Negative for cough, chest tightness, shortness of breath and wheezing.   Cardiovascular: Negative for palpitations and leg swelling.  Gastrointestinal: Negative for nausea and vomiting.  Genitourinary: Negative for dysuria.  Musculoskeletal: Negative for joint swelling.  Skin: Negative for rash.  Neurological: Negative for headaches.  Hematological: Does not bruise/bleed easily.  Psychiatric/Behavioral: Negative for dysphoric mood. The patient is not nervous/anxious.        Objective:   Physical Exam GEN: A/Ox3; pleasant , NAD, elderly   HEENT:  [place]/AT,  EACs-clear, TMs-wnl, NOSE-clear, THROAT-clear, no lesions, no postnasal drip or exudate noted.   NECK:  Supple w/ fair ROM; no JVD; normal carotid impulses w/o bruits; no thyromegaly or nodules palpated; no  lymphadenopathy.  RESP  Clear  P & A; w/o, wheezes/ rales/ or rhonchi.no accessory muscle use, no dullness to percussion  CARD:  RRR, 2-3/6 SM   , no peripheral edema, pulses intact, no cyanosis or clubbing.  GI:   Soft & nt; nml bowel sounds; no organomegaly or masses detected.  Musco: Warm bil, no deformities or joint swelling noted.   Neuro: alert, no focal deficits noted.    Skin: Warm, no lesions or rashes          Assessment & Plan:

## 2013-10-22 NOTE — Assessment & Plan Note (Signed)
Acute CHF in setting of severe AV stenosis resolved with diuresis Appear euvolemic  follow up with cards as planned  Has upcoming R /L heart Cath.

## 2013-10-22 NOTE — Assessment & Plan Note (Signed)
RLL infiltrate -pneumococcal PNA  Clinically improving  , cxr w/ improvement in RLL aeration  Will need serial follow up   Plan  Follow up Dr. [name] in 3 weeks with cxr

## 2013-10-23 ENCOUNTER — Encounter (HOSPITAL_COMMUNITY): Payer: Self-pay

## 2013-10-23 ENCOUNTER — Ambulatory Visit (HOSPITAL_COMMUNITY): Admit: 2013-10-23 | Payer: Medicare Other | Admitting: Cardiovascular Disease

## 2013-10-23 SURGERY — LEFT AND RIGHT HEART CATHETERIZATION WITH CORONARY ANGIOGRAM
Anesthesia: LOCAL

## 2013-10-24 ENCOUNTER — Encounter (HOSPITAL_COMMUNITY): Payer: Medicare Other

## 2013-10-28 DIAGNOSIS — IMO0001 Reserved for inherently not codable concepts without codable children: Secondary | ICD-10-CM

## 2013-10-28 DIAGNOSIS — I509 Heart failure, unspecified: Secondary | ICD-10-CM

## 2013-10-28 DIAGNOSIS — I219 Acute myocardial infarction, unspecified: Secondary | ICD-10-CM

## 2013-10-28 DIAGNOSIS — R269 Unspecified abnormalities of gait and mobility: Secondary | ICD-10-CM

## 2013-10-29 ENCOUNTER — Encounter (HOSPITAL_COMMUNITY): Payer: Medicare Other

## 2013-10-31 ENCOUNTER — Encounter (HOSPITAL_COMMUNITY): Payer: Medicare Other

## 2013-11-15 ENCOUNTER — Other Ambulatory Visit: Payer: Self-pay | Admitting: *Deleted

## 2013-11-15 DIAGNOSIS — I350 Nonrheumatic aortic (valve) stenosis: Secondary | ICD-10-CM

## 2013-11-19 ENCOUNTER — Ambulatory Visit (INDEPENDENT_AMBULATORY_CARE_PROVIDER_SITE_OTHER)
Admission: RE | Admit: 2013-11-19 | Discharge: 2013-11-19 | Disposition: A | Discharge status: Home / Self Care | Payer: Medicare Other | Source: Ambulatory Visit | Attending: Internal Medicine | Admitting: Internal Medicine

## 2013-11-19 ENCOUNTER — Ambulatory Visit (INDEPENDENT_AMBULATORY_CARE_PROVIDER_SITE_OTHER): Payer: Medicare Other | Admitting: Internal Medicine

## 2013-11-19 VITALS — BP 152/80 | HR 95 | Ht 68.0 in | Wt 222.6 lb

## 2013-11-19 DIAGNOSIS — J13 Pneumonia due to Streptococcus pneumoniae: Secondary | ICD-10-CM

## 2013-11-19 DIAGNOSIS — J449 Chronic obstructive pulmonary disease, unspecified: Secondary | ICD-10-CM

## 2013-11-19 NOTE — Progress Notes (Signed)
Subjective:    Patient ID: [name], female    DOB: 06/23/1946, 68 y.o.   MRN: [id number]  HPI   #Obese  Body mass index is 34.85 kg/(m^2). Body mass index is 35.98 kg/(m^2). - 05/03/2013 Body mass index is 35.25 kg/(m^2). on 06/25/2013      #Ex 15 pack smoker. Quit 1970s  #COPD/ASthma - Baseline CAT score 18 in May 2013 (poor score is 4 sleep quality, level 3 for dyspnea/fatigue)  #Pulmonary nodules  -  Colon Cancer during cancer Rx had CT chest Oct 2012 and pulmonary nodules noted  - October 2012: There is an irregular nodule in the right upper lobe measuring 12 mm which is unchanged from prior and has adjacent bronchiectasis also unchanged. Within the left lower lobe, there is a 4 mm pulmonary nodule (image 30) which is unchanged.  - October 2013: No change on CT chest Oct 2013   #colon Cancer  - 2012 (early) - s/p surgery and chemo  #Arctic valve stenosis  0 Dr. Rollene Fare  #Exacerbations of obstructive lung disease   - November 2013: Treatment from office with Levaquin and Medrol Dosepak  - March 2-14: Prednisone opd for flare up     OV .01/01/2013  Follows up for COPD/asthma.  - She is reporting chronic worsening of dyspnea for the last several months but the worsening is mild overall that she feels stable. In fact on COPD cat score is only 9 and is much better than baseline of 18 and also much improved compared to 38 at last visit in November 2013 when she was in an exacerbation. She says that her aortic  valve stenosis is stable and is being monitored by [facility] cardiology Dr. Rollene Fare. She feels deconditioned and is wondering if she needs to be attend pulmonary rehabilitation maintenance program. She denies any overt wheezing or worsening cough or sputum but on exam she was actively wheezing this visit   REC  #COPD  - You have mild wheezing even though you feel well  Take prednisone 40 mg daily x 2 days, then 20mg  daily x 2 days, then 10mg  daily  x 2 days, then 5mg  daily x 2 days and stop  I will re\re refer you to pulmonary rehabilitation, you can start that for maintenance program  Start N. acetylcysteine 600 mg twice a day  - [place] study published in [place] in 2014 shows it helps  - this is not to make you feel better but to to prevent recurrent bronchitis episodes  - You are more likely to get this drug at [facility] or a vitamin store then at [employer] or [place] or [employer]  - You can also get this at website [employer]  - It functions as an anti-oxidant and there are minimal/none side effects  #Followup  4-5 months with COPD cat score at followup   OV 05/03/2013  FU for copd/asthma  - Has been told that AS is severe and needs AVR. SO she has joined rehab. Has seen dr Cyndia Bent. COPD itrself is stable. No AECOPD. Continues with mdi. Feels good. Last time she was asymtoatic and we gave pred becuaes of wheeeon exam. THis timme too though baseline has occ wheeze despite spiriva and advair.  COPD CAT score is 19 but says this is stable.   HEr obesity continues to be a problem; says she is trying not to eat and joining rehab but unable to lose weight. I counseled her about diet and she rolled her eyes  when I suggested list of foods to eat and list of foods to avoid. She acknowledged that it would be difficult for her to make a change. She thinks that weight loss involves eating less. She does not realize that weight loss involves eating different which he admits is a harder challenge      Past, Family, Social reviewed: Has seen Dr Cyndia Bent for aortic stenosis and has been recommended valve replacement   #COPD  - some wheezing depsite subjective baseline state - continue spiriva  - chagne ADVAIR to BREO 1 puff daily - flu shot in fall  - continue rehab  #Weight mgmt #WEight Management    - we discussed extensively about weight management   - follow low glycemic diet plan that I outlined for you after extensive  discussion. Do not follow other plans  #ROV 2 months    OV 06/25/2013 Followup COPD and weight management of obesity  - Terms of weight management: She has not lost any weight. She flatly told me that she's not interested in the low glycemic diet because it is too difficult.  - Terms of COPD: She's not taking Spiriva and Brio. After change to Brio the COPD cat score is improved and is now 12.5. She feels a COPD status is stable. She is compliant with her medications. Especially Spiriva and Brio. She has not had a flu shot but will have it at her work at [employer] hospital. She wants Pneumovax administered today.  In terms of her attic stenosis: She does not know her surgery date and a followup with her cardiologist is pending    10/22/2013 [facility] follow up  Patient returns for a post hospital followup. Patient was admitted January 1 through January 6 for pneumococcal pneumonia, and COPD, exacerbation. Her condition is complicated by severe aortic valve stenosis, and acute on chronic congestive heart failure. CXR showed RLL infiltrate.  Her urine strep was positive. Ultimately her respiratory status improved aggressive IV antibiotics, nebulized bronchodilators, IV steroids, and diuresis. Patient's troponin was elevated. She was seen by cardiology and has an outpatient catheter planned She was discharged on Levaquin and a Medrol Dosepak. She has finished both of these prescriptions. 2-D echo showed an ejection fracture of 65%, severe aortic stenosis, severe mitral valve calcification and mildly dilated. Right ventricle. Since discharge, she is feeling some better but still weak.  CXR today shows some improvement in RLL infiltrate.  Weight is down 16 pounds. Patient says that she has no lower showed a swelling. She denies any hemoptysis, chest pain, orthopnea, PND, or fever Appetite is slowly returning. She has no nausea, vomiting, diarrhea.    OV 11/19/2013 Chief Complaint  Patient  presents with  . COPD    Pt c/o voice loss that started today. Pt denies any cough, congestion, chest pain, tightness.     Followup COPD in the setting of obesity and severe aortic stenosis  She continues to get better overall but still feeling weak and deconditioned. In fact COPD cat score is higher than baseline at 20. She does not have any COPD exacerbation symptoms although she does have some hoarseness of voice that started today. She feels that this might be a precursor to impending COPD exacerbation but she actively denies worsening cough or sputum production of fever or chills. Chest x-ray today shows mild residual right lower lobe infiltrate and a right small effusion  In terms of her aortic stenosis: She's having a cardiac cath at the end of this month  every 2015. This is been postponed to this date because of her pneumococcal pneumonia. She is thinking about postponing her aortic valve surgery to May 2015. I've told her that she should consider getting this done as early as possible instead of trying to postpone issue    CAT COPD Symptom & Quality of Life Score ([employer]) 0 is no burden. 5 is highest burden 09/04/2012 Exacerbation  01/01/2013  05/03/2013  06/25/2013 With breo 11/19/2013   Never Cough -> Cough all the time 4 2 2  1.5 1  No phlegm in chest -> Chest is full of phlegm 4 0.5 1 0.5 1  No chest tightness -> Chest feels very tight 5 0.5 3 1.5 2  No dyspnea for 1 flight stairs/hill -> Very dyspneic for 1 flight of stairs 5 3.5 4 4 5   No limitations for ADL at home -> Very limited with ADL at home 5 1.5 2 2 4   Confident leaving home -> Not at all confident leaving home 5 0 1 0 1  Sleep soundly -> Do not sleep soundly because of lung condition 5 0 4 0 4  Lots of Energy -> No energy at all 5 1 2.5 3 2.5  TOTAL Score (max 40)  38 9 19.5 12.5 20.5     Dg Chest 2 View  11/19/2013   CLINICAL DATA:  Followup pneumonia, shortness of breath, history COPD, hypertension,  asthma  EXAM: CHEST  2 VIEW  COMPARISON:  10/22/2013  FINDINGS: Enlargement of cardiac silhouette.  Mediastinal contours and pulmonary vascularity normal.  Minimal right apical pleural thickening stable.  Emphysematous and bronchitic changes consistent with COPD.  Mild residual right basilar infiltrate.  Minimal scarring at left base.  Remaining lungs clear.  No pneumothorax.  Question minimal right pleural effusion.  Mitral annular calcification again noted.  IMPRESSION: COPD changes with mild residual infiltrate at right base and question minimal right pleural effusion.   Electronically Signed   By: Lavonia Dana [name]   On: 11/19/2013 16:07      Review of Systems  Constitutional: Negative for fever and unexpected weight change.  HENT: Positive for sore throat and voice change. Negative for congestion, dental problem, ear pain, nosebleeds, postnasal drip, rhinorrhea, sinus pressure, sneezing and trouble swallowing.   Eyes: Negative for redness and itching.  Respiratory: Negative for cough, chest tightness, shortness of breath and wheezing.   Cardiovascular: Negative for palpitations and leg swelling.  Gastrointestinal: Negative for nausea and vomiting.  Genitourinary: Negative for dysuria.  Musculoskeletal: Negative for joint swelling.  Skin: Negative for rash.  Neurological: Negative for headaches.  Hematological: Does not bruise/bleed easily.  Psychiatric/Behavioral: Negative for dysphoric mood. The patient is not nervous/anxious.        Objective:   Physical Exam  onstitutional: She is oriented to person, place, and time. She appears well-developed and well-nourished. No distress but she looks a little but more deconditioned and baseline HENT:  Head: Normocephalic and atraumatic.  Right Ear: External ear normal.  Left Ear: External ear normal.  Mouth/Throat: Oropharynx is clear and moist. No oropharyngeal exudate.  Eyes: Conjunctivae and EOM are normal. Pupils are equal, round, and  reactive to light. Right eye exhibits no discharge. Left eye exhibits no discharge. No scleral icterus.  Neck: Normal range of motion. Neck supple. No JVD present. No tracheal deviation present. No thyromegaly present.  Cardiovascular: Normal rate, regular rhythm and intact distal pulses.  Exam reveals no gallop and no friction rub.   Murmur heard.  Ejection systolic murmur + Pulmonary/Chest: Effort normal. No respiratory distress.  She exhibits no tenderness.   some scattered faint respiratory wheeze but seems to come from the upper airway  t Abdominal: Soft. Bowel sounds are normal. She exhibits no distension and no mass. There is no tenderness. There is no rebound and no guarding.  Musculoskeletal: Normal range of motion. She exhibits no edema and no tenderness.  Lymphadenopathy:    She has no cervical adenopathy.  Neurological: She is alert and oriented to person, place, and time. She has normal reflexes. No cranial nerve deficit. She exhibits normal muscle tone. Coordination normal.  Skin: Skin is warm and dry. No rash noted. She is not diaphoretic. No erythema. No pallor.  Psychiatric: She has a normal mood and affect. Her behavior is normal. Judgment and thought content normal.        Assessment & Plan:

## 2013-11-19 NOTE — Patient Instructions (Addendum)
Glad you are doing better but need to keep close eye to ensure you are out of woods Do CXR 2 view in 2 weeks - for followup pneumonia and effusion REgarding hoarse voice: call office [phone] in 24h-48h to report progress  FU [name] after 2 weeks with CXR 

## 2013-11-25 ENCOUNTER — Observation Stay (HOSPITAL_COMMUNITY)
Admission: EM | Admit: 2013-11-25 | Discharge: 2013-11-27 | Disposition: A | Discharge status: Home / Self Care | Payer: Medicare Other | Attending: Internal Medicine | Admitting: Internal Medicine

## 2013-11-25 ENCOUNTER — Encounter (HOSPITAL_COMMUNITY): Payer: Self-pay | Admitting: Emergency Medicine

## 2013-11-25 ENCOUNTER — Encounter (HOSPITAL_COMMUNITY): Payer: Self-pay | Admitting: Pharmacy Technician

## 2013-11-25 ENCOUNTER — Telehealth: Payer: Self-pay | Admitting: Internal Medicine

## 2013-11-25 DIAGNOSIS — Z87891 Personal history of nicotine dependence: Secondary | ICD-10-CM | POA: Insufficient documentation

## 2013-11-25 DIAGNOSIS — J13 Pneumonia due to Streptococcus pneumoniae: Secondary | ICD-10-CM

## 2013-11-25 DIAGNOSIS — R911 Solitary pulmonary nodule: Secondary | ICD-10-CM

## 2013-11-25 DIAGNOSIS — I359 Nonrheumatic aortic valve disorder, unspecified: Secondary | ICD-10-CM | POA: Insufficient documentation

## 2013-11-25 DIAGNOSIS — Z885 Allergy status to narcotic agent status: Secondary | ICD-10-CM | POA: Insufficient documentation

## 2013-11-25 DIAGNOSIS — R778 Other specified abnormalities of plasma proteins: Secondary | ICD-10-CM

## 2013-11-25 DIAGNOSIS — R011 Cardiac murmur, unspecified: Secondary | ICD-10-CM

## 2013-11-25 DIAGNOSIS — K589 Irritable bowel syndrome without diarrhea: Secondary | ICD-10-CM | POA: Insufficient documentation

## 2013-11-25 DIAGNOSIS — Z888 Allergy status to other drugs, medicaments and biological substances status: Secondary | ICD-10-CM | POA: Insufficient documentation

## 2013-11-25 DIAGNOSIS — K219 Gastro-esophageal reflux disease without esophagitis: Secondary | ICD-10-CM

## 2013-11-25 DIAGNOSIS — I10 Essential (primary) hypertension: Secondary | ICD-10-CM

## 2013-11-25 DIAGNOSIS — R6521 Severe sepsis with septic shock: Secondary | ICD-10-CM

## 2013-11-25 DIAGNOSIS — J9620 Acute and chronic respiratory failure, unspecified whether with hypoxia or hypercapnia: Secondary | ICD-10-CM

## 2013-11-25 DIAGNOSIS — N179 Acute kidney failure, unspecified: Secondary | ICD-10-CM

## 2013-11-25 DIAGNOSIS — R110 Nausea: Principal | ICD-10-CM | POA: Diagnosis present

## 2013-11-25 DIAGNOSIS — R112 Nausea with vomiting, unspecified: Secondary | ICD-10-CM

## 2013-11-25 DIAGNOSIS — B3789 Other sites of candidiasis: Secondary | ICD-10-CM

## 2013-11-25 DIAGNOSIS — I350 Nonrheumatic aortic (valve) stenosis: Secondary | ICD-10-CM

## 2013-11-25 DIAGNOSIS — C20 Malignant neoplasm of rectum: Secondary | ICD-10-CM

## 2013-11-25 DIAGNOSIS — I080 Rheumatic disorders of both mitral and aortic valves: Secondary | ICD-10-CM

## 2013-11-25 DIAGNOSIS — G5710 Meralgia paresthetica, unspecified lower limb: Secondary | ICD-10-CM

## 2013-11-25 DIAGNOSIS — J189 Pneumonia, unspecified organism: Secondary | ICD-10-CM

## 2013-11-25 DIAGNOSIS — R531 Weakness: Secondary | ICD-10-CM

## 2013-11-25 DIAGNOSIS — R059 Cough, unspecified: Secondary | ICD-10-CM

## 2013-11-25 DIAGNOSIS — J984 Other disorders of lung: Secondary | ICD-10-CM

## 2013-11-25 DIAGNOSIS — M549 Dorsalgia, unspecified: Secondary | ICD-10-CM

## 2013-11-25 DIAGNOSIS — N390 Urinary tract infection, site not specified: Secondary | ICD-10-CM

## 2013-11-25 DIAGNOSIS — D539 Nutritional anemia, unspecified: Secondary | ICD-10-CM

## 2013-11-25 DIAGNOSIS — R05 Cough: Secondary | ICD-10-CM

## 2013-11-25 DIAGNOSIS — J309 Allergic rhinitis, unspecified: Secondary | ICD-10-CM

## 2013-11-25 DIAGNOSIS — Z923 Personal history of irradiation: Secondary | ICD-10-CM

## 2013-11-25 DIAGNOSIS — Z432 Encounter for attention to ileostomy: Secondary | ICD-10-CM

## 2013-11-25 DIAGNOSIS — J441 Chronic obstructive pulmonary disease with (acute) exacerbation: Secondary | ICD-10-CM

## 2013-11-25 DIAGNOSIS — R748 Abnormal levels of other serum enzymes: Secondary | ICD-10-CM | POA: Insufficient documentation

## 2013-11-25 DIAGNOSIS — J449 Chronic obstructive pulmonary disease, unspecified: Secondary | ICD-10-CM

## 2013-11-25 DIAGNOSIS — E871 Hypo-osmolality and hyponatremia: Secondary | ICD-10-CM

## 2013-11-25 DIAGNOSIS — D509 Iron deficiency anemia, unspecified: Secondary | ICD-10-CM

## 2013-11-25 DIAGNOSIS — R55 Syncope and collapse: Secondary | ICD-10-CM

## 2013-11-25 DIAGNOSIS — E785 Hyperlipidemia, unspecified: Secondary | ICD-10-CM

## 2013-11-25 DIAGNOSIS — R7989 Other specified abnormal findings of blood chemistry: Secondary | ICD-10-CM

## 2013-11-25 DIAGNOSIS — Z882 Allergy status to sulfonamides status: Secondary | ICD-10-CM | POA: Insufficient documentation

## 2013-11-25 DIAGNOSIS — E860 Dehydration: Secondary | ICD-10-CM

## 2013-11-25 DIAGNOSIS — R197 Diarrhea, unspecified: Secondary | ICD-10-CM | POA: Insufficient documentation

## 2013-11-25 DIAGNOSIS — L8990 Pressure ulcer of unspecified site, unspecified stage: Secondary | ICD-10-CM

## 2013-11-25 DIAGNOSIS — L259 Unspecified contact dermatitis, unspecified cause: Secondary | ICD-10-CM

## 2013-11-25 DIAGNOSIS — J45909 Unspecified asthma, uncomplicated: Secondary | ICD-10-CM

## 2013-11-25 DIAGNOSIS — R1110 Vomiting, unspecified: Secondary | ICD-10-CM

## 2013-11-25 DIAGNOSIS — J4489 Other specified chronic obstructive pulmonary disease: Secondary | ICD-10-CM

## 2013-11-25 DIAGNOSIS — A419 Sepsis, unspecified organism: Secondary | ICD-10-CM

## 2013-11-25 DIAGNOSIS — I509 Heart failure, unspecified: Secondary | ICD-10-CM

## 2013-11-25 LAB — CBC WITH DIFFERENTIAL/PLATELET
Basophils Absolute: 0 10*3/uL (ref 0.0–0.1)
Basophils Relative: 0 % (ref 0–1)
Eosinophils Absolute: 0.1 10*3/uL (ref 0.0–0.7)
Eosinophils Relative: 1 % (ref 0–5)
HCT: 39.7 % (ref 36.0–46.0)
Hemoglobin: 13.5 g/dL (ref 12.0–15.0)
Lymphocytes Relative: 1 % — ABNORMAL LOW (ref 12–46)
Lymphs Abs: 0.2 10*3/uL — ABNORMAL LOW (ref 0.7–4.0)
MCH: 31.8 pg (ref 26.0–34.0)
MCHC: 34 g/dL (ref 30.0–36.0)
MCV: 93.6 fL (ref 78.0–100.0)
Monocytes Absolute: 0.4 10*3/uL (ref 0.1–1.0)
Monocytes Relative: 4 % (ref 3–12)
Neutro Abs: 10.4 10*3/uL — ABNORMAL HIGH (ref 1.7–7.7)
Neutrophils Relative %: 94 % — ABNORMAL HIGH (ref 43–77)
Platelets: 163 10*3/uL (ref 150–400)
RBC: 4.24 MIL/uL (ref 3.87–5.11)
RDW: 14.6 % (ref 11.5–15.5)
WBC: 11.1 10*3/uL — ABNORMAL HIGH (ref 4.0–10.5)

## 2013-11-25 LAB — URINE MICROSCOPIC-ADD ON

## 2013-11-25 LAB — POCT I-STAT TROPONIN I: Troponin i, poc: 0.12 ng/mL (ref 0.00–0.08)

## 2013-11-25 LAB — URINALYSIS, ROUTINE W REFLEX MICROSCOPIC
Glucose, UA: NEGATIVE mg/dL
Ketones, ur: 15 mg/dL — AB
Nitrite: NEGATIVE
Protein, ur: 30 mg/dL — AB
Specific Gravity, Urine: 1.029 (ref 1.005–1.030)
Urobilinogen, UA: 1 mg/dL (ref 0.0–1.0)
pH: 5.5 (ref 5.0–8.0)

## 2013-11-25 LAB — COMPREHENSIVE METABOLIC PANEL
ALT: 9 U/L (ref 0–35)
AST: 16 U/L (ref 0–37)
Albumin: 3.2 g/dL — ABNORMAL LOW (ref 3.5–5.2)
Alkaline Phosphatase: 99 U/L (ref 39–117)
BUN: 21 mg/dL (ref 6–23)
CO2: 23 mEq/L (ref 19–32)
Calcium: 9.1 mg/dL (ref 8.4–10.5)
Chloride: 103 mEq/L (ref 96–112)
Creatinine, Ser: 0.91 mg/dL (ref 0.50–1.10)
GFR calc Af Amer: 73 mL/min — ABNORMAL LOW (ref >90)
GFR calc non Af Amer: 63 mL/min — ABNORMAL LOW (ref >90)
Glucose, Bld: 108 mg/dL — ABNORMAL HIGH (ref 70–99)
Potassium: 4.3 mEq/L (ref 3.7–5.3)
Sodium: 140 mEq/L (ref 137–147)
Total Bilirubin: 0.6 mg/dL (ref 0.3–1.2)
Total Protein: 7.1 g/dL (ref 6.0–8.3)

## 2013-11-25 MED ORDER — PREDNISONE 10 MG PO TABS: ORAL_TABLET | ORAL | Status: DC

## 2013-11-25 MED ORDER — LEVOFLOXACIN 500 MG PO TABS: 500.0000 mg | ORAL_TABLET | Freq: Every day | ORAL | Status: DC

## 2013-11-25 MED ORDER — SODIUM CHLORIDE 0.9 % IV BOLUS (SEPSIS)
2000.0000 mL | Freq: Once | INTRAVENOUS | Status: AC
  Administered 2013-11-25: 2000 mL via INTRAVENOUS

## 2013-11-25 MED ORDER — ONDANSETRON HCL 4 MG/2ML IJ SOLN
4.0000 mg | Freq: Once | INTRAMUSCULAR | Status: AC
  Administered 2013-11-25: 4 mg via INTRAVENOUS
  Filled 2013-11-25: qty 2

## 2013-11-25 MED ORDER — METOCLOPRAMIDE HCL 5 MG/ML IJ SOLN
10.0000 mg | Freq: Once | INTRAMUSCULAR | Status: AC
  Administered 2013-11-25: 10 mg via INTRAVENOUS
  Filled 2013-11-25: qty 2

## 2013-11-25 MED ORDER — DEXTROSE 5 % IV SOLN
1.0000 g | Freq: Once | INTRAVENOUS | Status: AC
  Administered 2013-11-25: 1 g via INTRAVENOUS
  Filled 2013-11-25: qty 10

## 2013-11-25 NOTE — ED Notes (Signed)
Bedside commode at [facility].

## 2013-11-25 NOTE — ED Notes (Signed)
EMS reports the pt has not felt well since yesterday, pt states she didn't feel her normal yesterday. Pt states she has had nausea, vomiting, and diarrhea today. EMS adm 4mg  zofran IV. EMS reports pt had a near syncopal episode witnessed by her husband, pt states she did not lose consciousness. Pt states she felt very dizzy after having an episode of diarrhea, pt reports she was very sweaty and then stood up and took two steps and fell down. Pt denies hitting anything on her way down.  Pt's husband helped her up, EMS called. CBG: 113.  EMS reports pt has hx of aortic stenosis and colon CA. PT is A&O X4. PT denies fever.

## 2013-11-25 NOTE — ED Notes (Signed)
MD at bedside. 

## 2013-11-25 NOTE — Telephone Encounter (Addendum)
Last OV 11-19-13. MR patient. Called and spoke with pt. C/o hoarseness not any better, scratchy throat, feels lousy (nothing specific), cough is not as bad. She reprots MR wanted her to call and let him know if she wasn't getting better today. Please advise thanks  Allergies  Allergen Reactions  . Codeine Nausea Only  . Prednisone Hives    In different places all over the body.  . Sulfonamide Derivatives Nausea Only

## 2013-11-25 NOTE — ED Provider Notes (Signed)
CSN: [id number]     Arrival date & time 11/25/13  2144 History   First MD Initiated Contact with Patient 11/25/13 2201     Chief Complaint  Patient presents with  . Near Syncope  . Vomiting  . Diarrhea     (Consider location/radiation/quality/duration/timing/severity/associated sxs/prior Treatment) HPI 68 year old female with history COPD admitted over a month ago with pneumonia, cough is now gone, she is about a week of hoarse voice without fever without cough without shortness of breath, but now today developed several episodes of nonbloody vomiting and diarrhea as well as lightheadedness with near-syncope and generalized weakness but no trauma no syncope no amnesia no fever no confusion no rash no abdominal pain but was unable to hold down antinausea medicine home so came to the emergency department for evaluation. Past Medical History  Diagnosis Date  . IBS (irritable bowel syndrome)   . Hyperlipidemia   . Allergic rhinitis   . History of pneumonia   . COPD (chronic obstructive pulmonary disease)   . Asthma   . Heart murmur     Aortic  . Lung nodule     RLL nodule-30mm stable 2006, April 2009, and June 2009  . Candidiasis of breast   . Anemia   . Hyperlipemia   . Obesity   . Unspecified essential hypertension   . Heart valve problem     PER CARDIOLOGIST 05-03-2011  . Sleep difficulties   . Hemorrhoids   . Sleep apnea     PT TOLD BORDERLINE-TRIED CPAP-DID NOT HELP-SHE DOES NOT USE CPAP  . Arthritis   . 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 History of radiation therapy 05/30/11 to 07/07/11    rectum  . Prophylactic chemotherapy     Will finish chemo on 03/04/12  . Pneumonia     hx of several times   . Shingles   . rectal ca 04/2011  . Severe aortic valve stenosis 10/01/2013  . Shortness of breath    Past Surgical History  Procedure Laterality Date  . Foot surgery  left foot    hammer toe  . Hand surgery  Bil    carpal tunnel  . Back surgery      2006 SPACER---2007 LUMBAR  FUSION  . Tonsillectomy    . Colon resection  08/25/2011    Procedure: COLON RESECTION LAPAROSCOPIC;  Surgeon: Stark Klein, MD;  Location: [facility];  Service: General;  Laterality: N/A;  Laparoscopic Assisted Low Anterior Resection Diverting Ostomy and onQ pain pump  . Ileostomy  08/25/2011  . Colon surgery    . Ileostomy closure  04/02/2012    Procedure: ILEOSTOMY TAKEDOWN;  Surgeon: Stark Klein, MD;  Location: [facility];  Service: General;  Laterality: N/A;  . Bowel resection  04/02/2012    Procedure: SMALL BOWEL RESECTION;  Surgeon: Stark Klein, MD;  Location: [facility];  Service: General;  Laterality: N/A;  . Carpal tunnel release     Family History  Problem Relation Age of Onset  . Lung cancer Father   . Cancer Father     lung  . Diabetes    . Heart disease    . Heart disease Mother   . Diabetes Mother   . Hypertension Mother   . Colon cancer Neg Hx   . Esophageal cancer Neg Hx   . Stomach cancer Neg Hx   . Rectal cancer Neg Hx    History  Substance Use Topics  . Smoking status: Former Smoker -- 1.50 packs/day for 10 years  Types: Cigarettes    Quit date: 12/27/1974  . Smokeless tobacco: Never Used  . Alcohol Use: No     Comment: rare   OB History   Grav Para Term Preterm Abortions TAB SAB Ect Mult Living                 Review of Systems 10 Systems reviewed and are negative for acute change except as noted in the HPI.   Allergies  Codeine; Prednisone; and Sulfonamide derivatives  Home Medications   Current Outpatient Rx  Name  Route  Sig  Dispense  Refill  . albuterol (PROVENTIL HFA;VENTOLIN HFA) 108 (90 BASE) MCG/ACT inhaler   Inhalation   Inhale 2 puffs into the lungs every 6 (six) hours as needed for wheezing.         Marland Kitchen aspirin 81 MG tablet   Oral   Take 81 mg by mouth at bedtime.          . Calcium Carbonate-Vitamin D (CALCIUM 600 + D PO)   Oral   Take 1,200 mg by mouth at bedtime.          Marland Kitchen diltiazem (CARDIZEM CD) 120 MG 24 hr capsule    Oral   Take 120 mg by mouth at bedtime.          . Fluticasone Furoate-Vilanterol (BREO ELLIPTA) 100-25 MCG/INH AEPB   Inhalation   Inhale 1 puff into the lungs daily.   30 each   5   . lovastatin (MEVACOR) 40 MG tablet   Oral   Take 40 mg by mouth at bedtime.         . montelukast (SINGULAIR) 10 MG tablet   Oral   Take 10 mg by mouth at bedtime.         . pantoprazole (PROTONIX) 40 MG tablet   Oral   Take 40 mg by mouth at bedtime.          Marland Kitchen tiotropium (SPIRIVA) 18 MCG inhalation capsule   Inhalation   Place 18 mcg into inhaler and inhale daily.          BP 104/39  Pulse 73  Temp(Src) 97.4 F (36.3 C) (Oral)  Resp 18  Ht 5\' 8"  (1.727 m)  Wt 234 lb 8 oz (106.369 kg)  BMI 35.66 kg/m2  SpO2 99% Physical Exam  Nursing note and vitals reviewed. Constitutional:  Awake, alert, nontoxic appearance.  HENT:  Head: Atraumatic.  Eyes: Right eye exhibits no discharge. Left eye exhibits no discharge.  Neck: Neck supple.  Cardiovascular: Normal rate and regular rhythm.   No murmur heard. Pulmonary/Chest: Effort normal and breath sounds normal. No respiratory distress. She has no wheezes. She has no rales. She exhibits no tenderness.  Abdominal: Soft. Bowel sounds are normal. She exhibits no distension and no mass. There is no tenderness. There is no rebound and no guarding.  Musculoskeletal: She exhibits no tenderness.  Baseline ROM, no obvious new focal weakness.  Neurological: She is alert.  Mental status and motor strength appears baseline for patient and situation.  Skin: No rash noted.  Psychiatric: She has a normal mood and affect.    ED Course  Procedures (including critical care time) Not able to take oral fluids yet, still has nausea, troponin mildly elevated (was 0.3-0.5 last admit with sepsis/PNA) clinically doubt ACS but generally weak so feel Obs reasonable; [facility] paged. 2340 D/w [facility] for Obs. 0003 Patient informed of clinical course, understand  medical decision-making process, and agree with  plan. Labs Review Labs Reviewed  CBC WITH DIFFERENTIAL - Abnormal; Notable for the following:    WBC 11.1 (*)    Neutrophils Relative % 94 (*)    Neutro Abs 10.4 (*)    Lymphocytes Relative 1 (*)    Lymphs Abs 0.2 (*)    All other components within normal limits  URINALYSIS, ROUTINE W REFLEX MICROSCOPIC - Abnormal; Notable for the following:    Color, Urine AMBER (*)    APPearance CLOUDY (*)    Hgb urine dipstick SMALL (*)    Bilirubin Urine SMALL (*)    Ketones, ur 15 (*)    Protein, ur 30 (*)    Leukocytes, UA SMALL (*)    All other components within normal limits  COMPREHENSIVE METABOLIC PANEL - Abnormal; Notable for the following:    Glucose, Bld 108 (*)    Albumin 3.2 (*)    GFR calc non Af Amer 63 (*)    GFR calc Af Amer 73 (*)    All other components within normal limits  URINE MICROSCOPIC-ADD ON - Abnormal; Notable for the following:    Squamous Epithelial / LPF FEW (*)    Bacteria, UA MANY (*)    Casts HYALINE CASTS (*)    All other components within normal limits  BASIC METABOLIC PANEL - Abnormal; Notable for the following:    Glucose, Bld 139 (*)    GFR calc non Af Amer 61 (*)    GFR calc Af Amer 71 (*)    All other components within normal limits  CBC - Abnormal; Notable for the following:    WBC 3.7 (*)    RBC 3.73 (*)    Hemoglobin 11.7 (*)    HCT 34.7 (*)    Platelets 131 (*)    All other components within normal limits  POCT I-STAT TROPONIN I - Abnormal; Notable for the following:    Troponin i, poc 0.12 (*)    All other components within normal limits  POCT I-STAT 3, BLOOD GAS (G3P V) - Abnormal; Notable for the following:    pH, Ven 7.381 (*)    pCO2, Ven 38.4 (*)    pO2, Ven 60.0 (*)    All other components within normal limits  POCT I-STAT 3, BLOOD GAS (G3P V) - Abnormal; Notable for the following:    pH, Ven 7.314 (*)    Bicarbonate 24.4 (*)    All other components within normal limits  POCT  I-STAT 3, BLOOD GAS (G3+) - Abnormal; Notable for the following:    pH, Arterial 7.332 (*)    pO2, Arterial 59.0 (*)    Acid-base deficit 4.0 (*)    All other components within normal limits  URINE CULTURE  CLOSTRIDIUM DIFFICILE BY PCR  TROPONIN I  TROPONIN I  TROPONIN I  PROTIME-INR   Imaging Review No results found.    MDM   Final diagnoses:  Vomiting and diarrhea  Dehydration  Weakness  Elevated troponin    The patient appears reasonably stabilized for admission considering the current resources, flow, and capabilities available in the ED at this time, and I doubt any other [facility] requiring further screening and/or treatment in the ED prior to admission.    Babette Relic, MD 11/27/13 [phone]

## 2013-11-25 NOTE — ED Notes (Signed)
Pt states she is suppose to go to the cath lab next week to have her valves visualized. Pt states she takes a baby aspirin every day. Pt place on 2L 02, pt's 02 sats reading 86% on RA.

## 2013-11-25 NOTE — Telephone Encounter (Signed)
I spoke with MR and he states to give prednisone 8 day taper and levaquin 500mg  x 5 days.  rx sent and pt is aware. Hendry Bing, CMA

## 2013-11-26 ENCOUNTER — Encounter (HOSPITAL_COMMUNITY): Payer: Self-pay | Admitting: Internal Medicine

## 2013-11-26 DIAGNOSIS — J449 Chronic obstructive pulmonary disease, unspecified: Secondary | ICD-10-CM

## 2013-11-26 DIAGNOSIS — R197 Diarrhea, unspecified: Secondary | ICD-10-CM

## 2013-11-26 DIAGNOSIS — R55 Syncope and collapse: Secondary | ICD-10-CM

## 2013-11-26 DIAGNOSIS — R799 Abnormal finding of blood chemistry, unspecified: Secondary | ICD-10-CM

## 2013-11-26 DIAGNOSIS — R1110 Vomiting, unspecified: Secondary | ICD-10-CM | POA: Diagnosis present

## 2013-11-26 DIAGNOSIS — I080 Rheumatic disorders of both mitral and aortic valves: Secondary | ICD-10-CM

## 2013-11-26 DIAGNOSIS — R112 Nausea with vomiting, unspecified: Secondary | ICD-10-CM | POA: Diagnosis present

## 2013-11-26 DIAGNOSIS — R110 Nausea: Secondary | ICD-10-CM | POA: Diagnosis present

## 2013-11-26 DIAGNOSIS — I359 Nonrheumatic aortic valve disorder, unspecified: Secondary | ICD-10-CM

## 2013-11-26 LAB — PROTIME-INR
INR: 1.12 (ref 0.00–1.49)
Prothrombin Time: 14.2 seconds (ref 11.6–15.2)

## 2013-11-26 LAB — TROPONIN I
Troponin I: <0.3 ng/mL (ref <0.30)
Troponin I: <0.3 ng/mL (ref <0.30)
Troponin I: <0.3 ng/mL (ref <0.30)

## 2013-11-26 LAB — URINE CULTURE: Colony Count: >=100000

## 2013-11-26 LAB — CLOSTRIDIUM DIFFICILE BY PCR: Toxigenic C. Difficile by PCR: NEGATIVE

## 2013-11-26 MED ORDER — SODIUM CHLORIDE 0.9 % IJ SOLN: 3.0000 mL | INTRAMUSCULAR | Status: DC | PRN

## 2013-11-26 MED ORDER — MONTELUKAST SODIUM 10 MG PO TABS
10.0000 mg | ORAL_TABLET | Freq: Every day | ORAL | Status: DC
  Administered 2013-11-26 (×2): 10 mg via ORAL
  Filled 2013-11-26 (×3): qty 1

## 2013-11-26 MED ORDER — ONDANSETRON HCL 4 MG/2ML IJ SOLN
4.0000 mg | Freq: Four times a day (QID) | INTRAMUSCULAR | Status: DC | PRN
Start: 2013-11-26 — End: 2013-11-27

## 2013-11-26 MED ORDER — TIOTROPIUM BROMIDE MONOHYDRATE 18 MCG IN CAPS
18.0000 ug | ORAL_CAPSULE | Freq: Every day | RESPIRATORY_TRACT | Status: DC
  Filled 2013-11-26 (×2): qty 5

## 2013-11-26 MED ORDER — ASPIRIN 81 MG PO CHEW
81.0000 mg | CHEWABLE_TABLET | Freq: Once | ORAL | Status: AC
  Administered 2013-11-27: 81 mg via ORAL

## 2013-11-26 MED ORDER — ASPIRIN 81 MG PO CHEW: 81.0000 mg | CHEWABLE_TABLET | Freq: Every day | ORAL | Status: DC

## 2013-11-26 MED ORDER — ACETAMINOPHEN 650 MG RE SUPP: 650.0000 mg | Freq: Four times a day (QID) | RECTAL | Status: DC | PRN

## 2013-11-26 MED ORDER — IPRATROPIUM BROMIDE 0.02 % IN SOLN
0.5000 mg | RESPIRATORY_TRACT | Status: DC | PRN
  Filled 2013-11-26: qty 2.5

## 2013-11-26 MED ORDER — SODIUM CHLORIDE 0.9 % IJ SOLN: 3.0000 mL | Freq: Two times a day (BID) | INTRAMUSCULAR | Status: DC

## 2013-11-26 MED ORDER — ACETAMINOPHEN 325 MG PO TABS
650.0000 mg | ORAL_TABLET | Freq: Four times a day (QID) | ORAL | Status: DC | PRN
  Administered 2013-11-26: 650 mg via ORAL
  Filled 2013-11-26: qty 2

## 2013-11-26 MED ORDER — LEVALBUTEROL HCL 0.63 MG/3ML IN NEBU
0.6300 mg | INHALATION_SOLUTION | Freq: Four times a day (QID) | RESPIRATORY_TRACT | Status: DC
  Administered 2013-11-26 – 2013-11-27 (×2): 0.63 mg via RESPIRATORY_TRACT
  Filled 2013-11-26 (×8): qty 3

## 2013-11-26 MED ORDER — ASPIRIN 81 MG PO CHEW
81.0000 mg | CHEWABLE_TABLET | Freq: Every day | ORAL | Status: DC
  Administered 2013-11-26: 81 mg via ORAL
  Filled 2013-11-26 (×3): qty 1

## 2013-11-26 MED ORDER — SODIUM CHLORIDE 0.9 % IV SOLN: 250.0000 mL | INTRAVENOUS | Status: DC | PRN

## 2013-11-26 MED ORDER — SODIUM CHLORIDE 0.9 % IV SOLN
1.0000 mL/kg/h | INTRAVENOUS | Status: DC
  Administered 2013-11-26 – 2013-11-27 (×2): 1 mL/kg/h via INTRAVENOUS

## 2013-11-26 MED ORDER — ENOXAPARIN SODIUM 40 MG/0.4ML ~~LOC~~ SOLN
40.0000 mg | SUBCUTANEOUS | Status: DC
  Administered 2013-11-26: 40 mg via SUBCUTANEOUS
  Filled 2013-11-26 (×2): qty 0.4

## 2013-11-26 MED ORDER — IPRATROPIUM BROMIDE 0.02 % IN SOLN
0.5000 mg | Freq: Four times a day (QID) | RESPIRATORY_TRACT | Status: DC
  Administered 2013-11-26 – 2013-11-27 (×3): 0.5 mg via RESPIRATORY_TRACT
  Filled 2013-11-26 (×2): qty 2.5

## 2013-11-26 MED ORDER — ATORVASTATIN CALCIUM 10 MG PO TABS
10.0000 mg | ORAL_TABLET | Freq: Every day | ORAL | Status: DC
  Administered 2013-11-26: 10 mg via ORAL
  Filled 2013-11-26 (×2): qty 1

## 2013-11-26 MED ORDER — METHYLPREDNISOLONE SODIUM SUCC 125 MG IJ SOLR
60.0000 mg | Freq: Once | INTRAMUSCULAR | Status: AC
  Administered 2013-11-26: 60 mg via INTRAVENOUS
  Filled 2013-11-26: qty 0.96

## 2013-11-26 MED ORDER — LEVALBUTEROL HCL 0.63 MG/3ML IN NEBU
0.6300 mg | INHALATION_SOLUTION | RESPIRATORY_TRACT | Status: DC | PRN
  Filled 2013-11-26: qty 3

## 2013-11-26 MED ORDER — ONDANSETRON HCL 4 MG PO TABS: 4.0000 mg | ORAL_TABLET | Freq: Four times a day (QID) | ORAL | Status: DC | PRN

## 2013-11-26 MED ORDER — SIMVASTATIN 20 MG PO TABS
20.0000 mg | ORAL_TABLET | Freq: Every day | ORAL | Status: DC
  Filled 2013-11-26: qty 1

## 2013-11-26 MED ORDER — ASPIRIN 81 MG PO CHEW: 81.0000 mg | CHEWABLE_TABLET | Freq: Every day | ORAL | Status: AC

## 2013-11-26 MED ORDER — PANTOPRAZOLE SODIUM 40 MG PO TBEC
40.0000 mg | DELAYED_RELEASE_TABLET | Freq: Every day | ORAL | Status: DC
  Administered 2013-11-26 (×2): 40 mg via ORAL
  Filled 2013-11-26 (×2): qty 1

## 2013-11-26 MED ORDER — ALBUTEROL SULFATE (2.5 MG/3ML) 0.083% IN NEBU
2.5000 mg | INHALATION_SOLUTION | Freq: Four times a day (QID) | RESPIRATORY_TRACT | Status: DC | PRN
  Administered 2013-11-26: 2.5 mg via RESPIRATORY_TRACT
  Filled 2013-11-26: qty 3

## 2013-11-26 MED ORDER — MOMETASONE FURO-FORMOTEROL FUM 100-5 MCG/ACT IN AERO
1.0000 | INHALATION_SPRAY | Freq: Two times a day (BID) | RESPIRATORY_TRACT | Status: DC
  Administered 2013-11-27: 1 via RESPIRATORY_TRACT
  Filled 2013-11-26 (×2): qty 8.8

## 2013-11-26 MED ORDER — DILTIAZEM HCL ER COATED BEADS 120 MG PO CP24
120.0000 mg | ORAL_CAPSULE | Freq: Every day | ORAL | Status: DC
  Administered 2013-11-26 (×2): 120 mg via ORAL
  Filled 2013-11-26 (×3): qty 1

## 2013-11-26 MED ORDER — SODIUM CHLORIDE 0.9 % IV SOLN
INTRAVENOUS | Status: AC
  Administered 2013-11-26: 03:00:00 via INTRAVENOUS

## 2013-11-26 NOTE — Consult Note (Addendum)
Reason for Consult: Syncope, aortic stenosis  Requesting Physician: [place]gi  Cardiologist: Lilienne Weins  HPI: She was admitted with (near) syncope following vomiting and loose stools. She has recovered fully. 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 Her most recent echocardiogram showed  severe aortic valve stenosis with mild aortic insufficiency. The estimated aortic valve area was around 0.7 cm square and the mean gradient was 40 mm Hg. In March of 2013 the mean aortic valve gradient was 51 mmHg (while anemic, secondary to colon cancer). The echo report also describes severe mitral stenosis, based on a mean gradient of 14 mm Hg, but I think this was exaggerated by the heart rate of 100 bpm when the study was performed. No mention was made of MR, previously described as mild to moderate.Echocardiography has shown severe calcification in the aortic and mitral valve annuli as well as in the intervalvular fibrosa and Dr. Cyndia Bent suggested that a TEE would be helpful in planning her valve surgery. Left ventricular systolic function has been normal.  There is no known history of CAD. She does not have atrial fibrillation, but has a dilated left atrium. In January, her heart cath was postponed due to pneumonia. She has undergone partial colectomy and is considered to be free of disease. She also has chronic lung disease and participates in pulmonary rehabilitation, under the care of Dr. [name]. She has worsening exertional dyspnea over the last 6 months or so. She has to rest before she can finish cleaning her living room (NYHA II-III). She describes occasional lightheadedness with activity but has not had any chest pain at rest or with exertion. She has occasional mild ankle swelling. She does not have complaints at rest and denies wheezing.  Additional comorbidities include treated hypertension and hyperlipidemia. She was recently  hospitalized for a small bowel obstruction that resolved spontaneously, without surgery. Repeat abdominal CT showed no evidence of cancer recurrence.   PMHx:  Past Medical History  Diagnosis Date  . IBS (irritable bowel syndrome)   . Hyperlipidemia   . Allergic rhinitis   . History of pneumonia   . COPD (chronic obstructive pulmonary disease)   . Asthma   . Heart murmur     Aortic  . Lung nodule     RLL nodule-47mm stable 2006, April 2009, and June 2009  . Candidiasis of breast   . Anemia   . Hyperlipemia   . Obesity   . Unspecified essential hypertension   . Heart valve problem     PER CARDIOLOGIST 05-03-2011  . Sleep difficulties   . Hemorrhoids   . Sleep apnea     PT TOLD BORDERLINE-TRIED CPAP-DID NOT HELP-SHE DOES NOT USE CPAP  . Arthritis   . 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 History of radiation therapy 05/30/11 to 07/07/11    rectum  . Prophylactic chemotherapy     Will finish chemo on 03/04/12  . Pneumonia     hx of several times   . Shingles   . rectal ca 04/2011  . Severe aortic valve stenosis 10/01/2013  . Shortness of breath    Past Surgical History  Procedure Laterality Date  . Foot surgery  left foot    hammer toe  . Hand surgery  Bil    carpal tunnel  . Back surgery      2006 SPACER---2007 LUMBAR FUSION  . Tonsillectomy    . Colon resection  08/25/2011  Procedure: COLON RESECTION LAPAROSCOPIC;  Surgeon: Stark Klein, MD;  Location: [facility];  Service: General;  Laterality: N/A;  Laparoscopic Assisted Low Anterior Resection Diverting Ostomy and onQ pain pump  . Ileostomy  08/25/2011  . Colon surgery    . Ileostomy closure  04/02/2012    Procedure: ILEOSTOMY TAKEDOWN;  Surgeon: Stark Klein, MD;  Location: [facility];  Service: General;  Laterality: N/A;  . Bowel resection  04/02/2012    Procedure: SMALL BOWEL RESECTION;  Surgeon: Stark Klein, MD;  Location: [facility];  Service: General;  Laterality: N/A;  . Carpal tunnel release      FAMHx: Family History    Problem Relation Age of Onset  . Lung cancer Father   . Cancer Father     lung  . Diabetes    . Heart disease    . Heart disease Mother   . Diabetes Mother   . Hypertension Mother   . Colon cancer Neg Hx   . Esophageal cancer Neg Hx   . Stomach cancer Neg Hx   . Rectal cancer Neg Hx     SOCHx:  reports that she quit smoking about 38 years ago. Her smoking use included Cigarettes. She has a 15 pack-year smoking history. She has never used smokeless tobacco. She reports that she does not drink alcohol or use illicit drugs.  ALLERGIES: Allergies  Allergen Reactions  . Codeine Nausea Only  . Prednisone Hives    In different places all over the body.  . Sulfonamide Derivatives Nausea Only    ROS: This is her first episode of syncope. Shortness of breath on exertion NYHA functional class II. Occasional exertional dizziness. Denies exertional angina or chest pain at rest.  The patient specifically denies orthopnea, paroxysmal nocturnal dyspnea, syncope, palpitations, focal neurological deficits, intermittent claudication, lower extremity edema, unexplained weight gain, cough, hemoptysis or wheezing.  The patient also denies abdominal pain, polyuria, polydipsia, dysuria, hematuria, frequency, urgency, abnormal bleeding or bruising, fever, chills, unexpected weight changes, mood swings, change in skin or hair texture, change in voice quality, auditory or visual problems, allergic reactions or rashes, new musculoskeletal complaints other than usual "aches and pains".   HOME MEDICATIONS: Prescriptions prior to admission  Medication Sig Dispense Refill  . albuterol (PROVENTIL HFA;VENTOLIN HFA) 108 (90 BASE) MCG/ACT inhaler Inhale 2 puffs into the lungs every 6 (six) hours as needed for wheezing.      Marland Kitchen aspirin 81 MG tablet Take 81 mg by mouth at bedtime.       . Calcium Carbonate-Vitamin D (CALCIUM 600 + D PO) Take 1,200 mg by mouth at bedtime.       Marland Kitchen diltiazem (CARDIZEM CD) 120 MG 24  hr capsule Take 120 mg by mouth at bedtime.       . Fluticasone Furoate-Vilanterol (BREO ELLIPTA) 100-25 MCG/INH AEPB Inhale 1 puff into the lungs daily.  30 each  5  . levofloxacin (LEVAQUIN) 500 MG tablet Take 1 tablet (500 mg total) by mouth daily.  5 tablet  0  . lovastatin (MEVACOR) 40 MG tablet Take 40 mg by mouth at bedtime.      . methylPREDNISolone (MEDROL DOSEPAK) 4 MG tablet Take 4-24 mg by mouth See admin instructions. follow package directions      . montelukast (SINGULAIR) 10 MG tablet Take 10 mg by mouth at bedtime.      . pantoprazole (PROTONIX) 40 MG tablet Take 40 mg by mouth at bedtime.       . polyethylene glycol (  MIRALAX / GLYCOLAX) packet Take 17 g by mouth daily.      Marland Kitchen tiotropium (SPIRIVA) 18 MCG inhalation capsule Place 18 mcg into inhaler and inhale daily.        HOSPITAL MEDICATIONS: I have reviewed the patient's current medications. Prior to Admission:  Prescriptions prior to admission  Medication Sig Dispense Refill  . albuterol (PROVENTIL HFA;VENTOLIN HFA) 108 (90 BASE) MCG/ACT inhaler Inhale 2 puffs into the lungs every 6 (six) hours as needed for wheezing.      Marland Kitchen aspirin 81 MG tablet Take 81 mg by mouth at bedtime.       . Calcium Carbonate-Vitamin D (CALCIUM 600 + D PO) Take 1,200 mg by mouth at bedtime.       Marland Kitchen diltiazem (CARDIZEM CD) 120 MG 24 hr capsule Take 120 mg by mouth at bedtime.       . Fluticasone Furoate-Vilanterol (BREO ELLIPTA) 100-25 MCG/INH AEPB Inhale 1 puff into the lungs daily.  30 each  5  . levofloxacin (LEVAQUIN) 500 MG tablet Take 1 tablet (500 mg total) by mouth daily.  5 tablet  0  . lovastatin (MEVACOR) 40 MG tablet Take 40 mg by mouth at bedtime.      . methylPREDNISolone (MEDROL DOSEPAK) 4 MG tablet Take 4-24 mg by mouth See admin instructions. follow package directions      . montelukast (SINGULAIR) 10 MG tablet Take 10 mg by mouth at bedtime.      . pantoprazole (PROTONIX) 40 MG tablet Take 40 mg by mouth at bedtime.       .  polyethylene glycol (MIRALAX / GLYCOLAX) packet Take 17 g by mouth daily.      Marland Kitchen tiotropium (SPIRIVA) 18 MCG inhalation capsule Place 18 mcg into inhaler and inhale daily.        VITALS: Blood pressure 110/49, pulse 65, temperature 98.4 F (36.9 C), temperature source Oral, resp. rate 17, height 5\' 8"  (1.727 m), weight 105.597 kg (232 lb 12.8 oz), SpO2 93.00%.  PHYSICAL EXAM:  General: Alert, oriented x3, no distress Head: no evidence of trauma, PERRL, EOMI, no exophtalmos or lid lag, no myxedema, no xanthelasma; normal ears, nose and oropharynx Neck: Normal jugular venous pulsations and no hepatojugular reflux; brisk carotid pulses without delay and no carotid bruits Chest: clear to auscultation, no signs of consolidation by percussion or palpation, normal fremitus, symmetrical and full respiratory excursions Cardiovascular: normal position and quality of the apical impulse, regular rhythm, normal first heart sound and soft second heart sound, no rubs or gallops, 3/6 midpeaking aortic ejection murmur, no diastolic rumble Abdomen: no tenderness or distention, no masses by palpation, no abnormal pulsatility or arterial bruits, normal bowel sounds, no hepatosplenomegaly Extremities: no clubbing, cyanosis;  no edema; 2+ radial, ulnar and brachial pulses bilaterally; 2+ right femoral, posterior tibial and dorsalis pedis pulses; 2+ left femoral, posterior tibial and dorsalis pedis pulses; no subclavian or femoral bruits Neurological: grossly nonfocal   LABS  CBC  Recent Labs  11/25/13 2210  WBC 11.1*  NEUTROABS 10.4*  HGB 13.5  HCT 39.7  MCV 93.6  PLT 423   Basic Metabolic Panel  Recent Labs  11/25/13 2210  NA 140  K 4.3  CL 103  CO2 23  GLUCOSE 108*  BUN 21  CREATININE 0.91  CALCIUM 9.1   Liver Function Tests  Recent Labs  11/25/13 2210  AST 16  ALT 9  ALKPHOS 99  BILITOT 0.6  PROT 7.1  ALBUMIN 3.2*   No results  found for this basename: LIPASE, AMYLASE,  in the  last 72 hours Cardiac Enzymes  Recent Labs  11/26/13 0200 11/26/13 0713 11/26/13 1350  TROPONINI <0.30 <0.30 <0.30    ECG: Sinus tachycardia, LA abnormality, RBBB+LAFB  TELEMETRY: NSR  IMPRESSION: 1. Her presyncope could be vasovagal, but she does not have a previous history of such in the past and I worryit may be another expression of the severity of her valve disease. 2. I think she has recovered well and we should proceed with the plans for right and left heart cath, preferably radial and antecubital approach, tomorrow. 3. This procedure has been fully reviewed with the patient and written informed consent has been obtained. 4. Will also need TEE to assess degree of intervalvular fibrosa calcification prior to surgical planning, as discussed in Dr. Vivi Martens note.    Time Spent Directly with Patient: 45 minutes  Sanda Klein, MD, [facility] [phone] office [phone] pager   11/26/2013, 5:00 PM

## 2013-11-26 NOTE — Progress Notes (Signed)
UR completed 

## 2013-11-26 NOTE — Progress Notes (Signed)
[facility] PROGRESS NOTE  [name] RCV:[id number] DOB: 1945-12-25 DOA: 11/25/2013 PCP: Adella Hare, MD  Assessment/Plan: #1 nausea/ vomiting/ diarrhea Likely viral gastroenteritis. C. difficile PCR is negative. Clinical improvement. Continue supportive care. Follow.  #2 syncope Questionable etiology. May have been vasovagal however patient with a history of severe aortic stenosis which could have been etiology. Carotid Dopplers are pending. Patient with recent 2-D echo and a such we'll not repeat. Serial cardiac enzymes are now negative. Will consult with cardiology for further evaluation and management.  #3 elevated troponin Patient denies any active chest pain. Serial enzymes have been negative. Follow. Cardiology consultation pending for problem #2.  #4 hypertension Stable. Continue Cardizem.  #5 COPD Patient with minimal wheezing on exam. Continued sclerae and Spiriva. We'll place on nebs as needed. We'll give a dose of IV Solu-Medrol x1.  #6 severe aortic stenosis Patient with severe aortic stenosis and primary cardiologist considering valvular repair/replacement. Patient was scheduled to have a cardiac catheterization next week. Due to patient's presentation of syncope which may very likely be vasovagal was cardiology to evaluate.  #7 prophylaxis Lovenox for DVT prophylaxis.  Code Status: Full Family Communication: Updated patient and husband at bedside. Disposition Plan: Home when medically stable.   Consultants:  Cardiology pending  Procedures:  None    Antibiotics:  None  HPI/Subjective: Patient with no further nausea or vomiting or diarrhea. Patient tolerating current diet.  Objective: Filed Vitals:   11/26/13 1300  BP: 110/49  Pulse: 65  Temp: 98.4 F (36.9 C)  Resp: 17    Intake/Output Summary (Last 24 hours) at 11/26/13 1405 Last data filed at 11/26/13 0650  Gross per 24 hour  Intake    360 ml  Output      0 ml  Net    360  ml   Filed Weights   11/25/13 2150 11/26/13 0700  Weight: 99.791 kg (220 lb) 105.597 kg (232 lb 12.8 oz)    Exam:   General:  NAD  Cardiovascular: RRR 3/6 sem  Respiratory: Minimal expiratory wheezing  Abdomen: Soft, nontender, nondistended, positive bowel sounds.  Musculoskeletal: No clubbing cyanosis or edema.  Data Reviewed: Basic Metabolic Panel:  Recent Labs Lab 11/25/13 2210  NA 140  K 4.3  CL 103  CO2 23  GLUCOSE 108*  BUN 21  CREATININE 0.91  CALCIUM 9.1   Liver Function Tests:  Recent Labs Lab 11/25/13 2210  AST 16  ALT 9  ALKPHOS 99  BILITOT 0.6  PROT 7.1  ALBUMIN 3.2*   No results found for this basename: LIPASE, AMYLASE,  in the last 168 hours No results found for this basename: AMMONIA,  in the last 168 hours CBC:  Recent Labs Lab 11/25/13 2210  WBC 11.1*  NEUTROABS 10.4*  HGB 13.5  HCT 39.7  MCV 93.6  PLT 163   Cardiac Enzymes:  Recent Labs Lab 11/26/13 0200 11/26/13 0713  TROPONINI <0.30 <0.30   BNP (last 3 results)  Recent Labs  10/10/13 1312 10/12/13 0230  PROBNP >70000.0* 8290.0*   CBG: No results found for this basename: GLUCAP,  in the last 168 hours  Recent Results (from the past 240 hour(s))  CLOSTRIDIUM DIFFICILE BY PCR     Status: None   Collection Time    11/26/13  2:35 AM      Result Value Ref Range Status   C difficile by pcr NEGATIVE  NEGATIVE Final     Studies: No results found.  Scheduled Meds: . aspirin  81 mg Oral QHS  . diltiazem  120 mg Oral QHS  . enoxaparin (LOVENOX) injection  40 mg Subcutaneous Q24H  . mometasone-formoterol  1 puff Inhalation BID  . montelukast  10 mg Oral QHS  . pantoprazole  40 mg Oral QHS  . simvastatin  20 mg Oral q1800  . sodium chloride  3 mL Intravenous Q12H  . tiotropium  18 mcg Inhalation Daily   Continuous Infusions:   Principal Problem:   Vomiting and diarrhea Active Problems:   HYPERLIPIDEMIA   Unspecified essential hypertension   COPD    GERD   Severe aortic valve stenosis   Elevated troponin   Nausea vomiting and diarrhea   Near syncope    Time spent: 59 mins    [facility] MD [facility] Pager [phone]. If 7PM-7AM, please contact night-coverage at [email or web address], password [facility] 11/26/2013, 2:05 PM  LOS: 1 day

## 2013-11-26 NOTE — H&P (Addendum)
History and Physical  [name] EXB:[id number] DOB: [birth year] DOA: 11/25/2013  Referring physician: EDP PCP: Adella Hare, MD  Outpatient Specialists:  1. Cardiology: Dr. Dani Gobble Croitoru  Chief Complaint: Nausea, dry heaves and diarrhea  HPI: [name] is a 68 y.o. female with extensive PMH-hyperlipidemia, COPD/asthma, not on nightly CPAP, RHD/severe aortic stenosis, hypertension, former smoker, obesity, pulmonary nodules, colon cancer status post surgery and chemotherapy, recently treated pneumonia, presented to the ED secondary to above complaints. She had been feeling poorly since 11/24/13. She had some hoarseness of voice and scratchy throat but no significant cough. She called her pulmonologist office and prescription for prednisone taper and oral Levaquin were called in which she has not yet started. Since 2/15, she started having nausea which seemed to improve on 2/16 a.m. and she tolerated breakfast. However since then she progressively had worsening nausea, dry heaving but no significant vomiting and 2-3 episodes of loose stools without blood or mucus. She denies fever, chills, abdominal pain, cough, dyspnea, chest pain or palpitations. Her appetite though has decreased. She was sitting on the toilet, had some dry heaves, started sweating profusely, felt dizzy, lightheaded and states that she fell backwards and is not sure if she passed out or not. She states that even if she passed out it was briefly for a few seconds. She denies any injuries. In the ED, WBC count 11.1 and troponin mildly elevated at 0.12. She states that she feels better after nausea medications. Hospitalist admission requested.  Review of Systems: All systems reviewed and apart from history of presenting illness, are negative.  Past Medical History  Diagnosis Date  . IBS (irritable bowel syndrome)   . Hyperlipidemia   . Allergic rhinitis   . History of pneumonia   . COPD (chronic obstructive  pulmonary disease)   . Asthma   . Heart murmur     Aortic  . Lung nodule     RLL nodule-74mm stable 2006, April 2009, and June 2009  . Candidiasis of breast   . Anemia   . Hyperlipemia   . Obesity   . Unspecified essential hypertension   . Heart valve problem     PER CARDIOLOGIST 05-03-2011  . Sleep difficulties   . Hemorrhoids   . Sleep apnea     PT TOLD BORDERLINE-TRIED CPAP-DID NOT HELP-SHE DOES NOT USE CPAP  . Arthritis   . 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 History of radiation therapy 05/30/11 to 07/07/11    rectum  . Prophylactic chemotherapy     Will finish chemo on 03/04/12  . Pneumonia     hx of several times   . Shingles   . rectal ca 04/2011  . Severe aortic valve stenosis 10/01/2013  . Shortness of breath    Past Surgical History  Procedure Laterality Date  . Foot surgery  left foot    hammer toe  . Hand surgery  Bil    carpal tunnel  . Back surgery      2006 SPACER---2007 LUMBAR FUSION  . Tonsillectomy    . Colon resection  08/25/2011    Procedure: COLON RESECTION LAPAROSCOPIC;  Surgeon: Stark Klein, MD;  Location: [facility];  Service: General;  Laterality: N/A;  Laparoscopic Assisted Low Anterior Resection Diverting Ostomy and onQ pain pump  . Ileostomy  08/25/2011  . Colon surgery    . Ileostomy closure  04/02/2012    Procedure: ILEOSTOMY TAKEDOWN;  Surgeon: Stark Klein, MD;  Location: [facility];  Service: General;  Laterality: N/A;  . Bowel resection  04/02/2012    Procedure: SMALL BOWEL RESECTION;  Surgeon: Stark Klein, MD;  Location: [facility];  Service: General;  Laterality: N/A;  . Carpal tunnel release     Social History:  reports that she quit smoking about 38 years ago. Her smoking use included Cigarettes. She has a 15 pack-year smoking history. She has never used smokeless tobacco. She reports that she does not drink alcohol or use illicit drugs.    Allergies  Allergen Reactions  . Codeine Nausea Only  . Prednisone Hives    In different places all over  the body.  . Sulfonamide Derivatives Nausea Only    Family History  Problem Relation Age of Onset  . Lung cancer Father   . Cancer Father     lung  . Diabetes    . Heart disease    . Heart disease Mother   . Diabetes Mother   . Hypertension Mother   . Colon cancer Neg Hx   . Esophageal cancer Neg Hx   . Stomach cancer Neg Hx   . Rectal cancer Neg Hx     Prior to Admission medications   Medication Sig Start Date End Date Taking? Authorizing Provider  albuterol (PROVENTIL HFA;VENTOLIN HFA) 108 (90 BASE) MCG/ACT inhaler Inhale 2 puffs into the lungs every 6 (six) hours as needed for wheezing.   Yes Historical Provider, MD  aspirin 81 MG tablet Take 81 mg by mouth at bedtime.    Yes Historical Provider, MD  Calcium Carbonate-Vitamin D (CALCIUM 600 + D PO) Take 1,200 mg by mouth at bedtime.    Yes Historical Provider, MD  diltiazem (CARDIZEM CD) 120 MG 24 hr capsule Take 120 mg by mouth at bedtime.    Yes Historical Provider, MD  Fluticasone Furoate-Vilanterol (BREO ELLIPTA) 100-25 MCG/INH AEPB Inhale 1 puff into the lungs daily. 07/15/13  Yes Brand Males, MD  levofloxacin (LEVAQUIN) 500 MG tablet Take 1 tablet (500 mg total) by mouth daily. 11/25/13  Yes Brand Males, MD  lovastatin (MEVACOR) 40 MG tablet Take 40 mg by mouth at bedtime.   Yes Historical Provider, MD  methylPREDNISolone (MEDROL DOSEPAK) 4 MG tablet Take 4-24 mg by mouth See admin instructions. follow package directions   Yes Historical Provider, MD  montelukast (SINGULAIR) 10 MG tablet Take 10 mg by mouth at bedtime.   Yes Historical Provider, MD  pantoprazole (PROTONIX) 40 MG tablet Take 40 mg by mouth at bedtime.  05/03/11  Yes Historical Provider, MD  polyethylene glycol (MIRALAX / GLYCOLAX) packet Take 17 g by mouth daily.   Yes Historical Provider, MD  tiotropium (SPIRIVA) 18 MCG inhalation capsule Place 18 mcg into inhaler and inhale daily.   Yes Historical Provider, MD   Physical Exam: Filed Vitals:    11/25/13 2315 11/25/13 2330 11/25/13 2345 11/26/13 0030  BP: 106/47 124/50 123/54 113/60  Pulse: 97 100 96 107  Temp:      TempSrc:      Resp: 17 21 17 19   Height:      Weight:      SpO2: 98% 98% 98% 97%     General exam: Moderately built and obese female patient, lying comfortably propped up on the gurney in no obvious distress.  Head, eyes and ENT: Nontraumatic and normocephalic. Pupils equally reacting to light and accommodation. Oral mucosa slightly dry. Throat mildly congested but without any other acute findings. No hoarseness at this time.  Neck: Supple. No JVD, carotid bruit  or thyromegaly.  Lymphatics: No lymphadenopathy.  Respiratory system: Occasional basal crackles but otherwise clear to auscultation. No increased work of breathing.  Cardiovascular system: S1 and S2 heard, RRR. No JVD, murmurs, gallops, clicks or pedal edema.  Gastrointestinal system: Abdomen is nondistended, soft and nontender. Normal bowel sounds heard. No organomegaly or masses appreciated.  Central nervous system: Alert and oriented. No focal neurological deficits.  Extremities: Symmetric 5 x 5 power. Peripheral pulses symmetrically felt.   Skin: No rashes or acute findings.  Musculoskeletal system: Negative exam.  Psychiatry: Pleasant and cooperative.   Labs on Admission:  Basic Metabolic Panel:  Recent Labs Lab 11/25/13 2210  NA 140  K 4.3  CL 103  CO2 23  GLUCOSE 108*  BUN 21  CREATININE 0.91  CALCIUM 9.1   Liver Function Tests:  Recent Labs Lab 11/25/13 2210  AST 16  ALT 9  ALKPHOS 99  BILITOT 0.6  PROT 7.1  ALBUMIN 3.2*   No results found for this basename: LIPASE, AMYLASE,  in the last 168 hours No results found for this basename: AMMONIA,  in the last 168 hours CBC:  Recent Labs Lab 11/25/13 2210  WBC 11.1*  NEUTROABS 10.4*  HGB 13.5  HCT 39.7  MCV 93.6  PLT 163   Cardiac Enzymes: No results found for this basename: CKTOTAL, CKMB, CKMBINDEX,  TROPONINI,  in the last 168 hours  BNP (last 3 results)  Recent Labs  10/10/13 1312 10/12/13 0230  PROBNP >70000.0* 8290.0*   CBG: No results found for this basename: GLUCAP,  in the last 168 hours  Radiological Exams on Admission: No results found.  EKG: Independently reviewed. Sinus tachycardia in the 106 beats per minute, left axis deviation, RBBB and no acute changes. No significant change compared to prior EKG.  Assessment/Plan Principal Problem:   Vomiting and diarrhea Active Problems:   HYPERLIPIDEMIA   Unspecified essential hypertension   COPD   GERD   Severe aortic valve stenosis   Elevated troponin   Nausea vomiting and diarrhea   Near syncope   1. Nausea, vomiting and diarrhea: Possibly acute viral GE. Less likely to be C. difficile in the absence of fever, abdominal pain or significant leukocytosis. Admit to telemetry for observation. Check stool for C. difficile PCR. Treat supportively with antiemetics, brief IV fluids and oral diet as tolerated. Although UA positive for many bacteria, not convincing for UTI in the absence of nitrites and significant leukocytosis- patient did receive a dose of Rocephin in ED- but will not continue. 2. Near syncope/syncope: DD-vasovagal from dry heaving, orthostatic hypotension in the context of severe aortic stenosis. Monitor on telemetry. Brief IV fluids. Check carotid Dopplers. Recent 2-D echo on 10/11/13 showed EF 65-70% and severe aortic stenosis. 3. Elevated troponin: In the absence of chest pain. Could be residual from hospitalization a month ago versus mild elevation secondary to demand ischemia from problem #1. Monitor on telemetry. Cycle troponins. Please alert/consult patient's primary cardiologist later today. 4. Hypertension: Controlled. 5. COPD/OSA: Stable. 6. Severe aortic stenosis: Management per cardiology- discuss with later today.    Code Status: Full  Family Communication: None at bedside  Disposition Plan: Home  when medically stable   Time spent: 55 minutes  Charda Janis, MD, FACP, FHM. [facility] Pager [phone]  If 7PM-7AM, please contact night-coverage [email or web address] Password [facility] 11/26/2013, 12:58 AM

## 2013-11-26 NOTE — Progress Notes (Signed)
VASCULAR LAB PRELIMINARY  PRELIMINARY  PRELIMINARY  PRELIMINARY  Carotid duplex completed.    Preliminary report:  Bilateral :  40-59% internal carotid artery stenosis.  Vertebral artery flow is antegrade  Darris Carachure, RVS 11/26/2013, 4:15 PM

## 2013-11-27 ENCOUNTER — Encounter: Payer: Self-pay | Admitting: Internal Medicine

## 2013-11-27 ENCOUNTER — Encounter (HOSPITAL_COMMUNITY)
Admission: EM | Disposition: A | Discharge status: Home / Self Care | Payer: Self-pay | Source: Home / Self Care | Attending: Emergency Medicine

## 2013-11-27 DIAGNOSIS — I10 Essential (primary) hypertension: Secondary | ICD-10-CM

## 2013-11-27 DIAGNOSIS — E860 Dehydration: Secondary | ICD-10-CM

## 2013-11-27 DIAGNOSIS — N390 Urinary tract infection, site not specified: Secondary | ICD-10-CM | POA: Insufficient documentation

## 2013-11-27 DIAGNOSIS — D509 Iron deficiency anemia, unspecified: Secondary | ICD-10-CM

## 2013-11-27 HISTORY — PX: LEFT AND RIGHT HEART CATHETERIZATION WITH CORONARY ANGIOGRAM: SHX5449

## 2013-11-27 LAB — POCT I-STAT 3, VENOUS BLOOD GAS (G3P V)
Acid-base deficit: 2 mmol/L (ref 0.0–2.0)
Acid-base deficit: 2 mmol/L (ref 0.0–2.0)
Bicarbonate: 22.8 mEq/L (ref 20.0–24.0)
Bicarbonate: 24.4 mEq/L — ABNORMAL HIGH (ref 20.0–24.0)
O2 Saturation: 59 %
O2 Saturation: 90 %
TCO2: 24 mmol/L (ref 0–100)
TCO2: 26 mmol/L (ref 0–100)
pCO2, Ven: 38.4 mmHg — ABNORMAL LOW (ref 45.0–50.0)
pCO2, Ven: 48.1 mmHg (ref 45.0–50.0)
pH, Ven: 7.314 — ABNORMAL HIGH (ref 7.250–7.300)
pH, Ven: 7.381 — ABNORMAL HIGH (ref 7.250–7.300)
pO2, Ven: 34 mmHg (ref 30.0–45.0)
pO2, Ven: 60 mmHg — ABNORMAL HIGH (ref 30.0–45.0)

## 2013-11-27 LAB — POCT I-STAT 3, ART BLOOD GAS (G3+)
Acid-base deficit: 4 mmol/L — ABNORMAL HIGH (ref 0.0–2.0)
Bicarbonate: 22 mEq/L (ref 20.0–24.0)
O2 Saturation: 88 %
TCO2: 23 mmol/L (ref 0–100)
pCO2 arterial: 41.6 mmHg (ref 35.0–45.0)
pH, Arterial: 7.332 — ABNORMAL LOW (ref 7.350–7.450)
pO2, Arterial: 59 mmHg — ABNORMAL LOW (ref 80.0–100.0)

## 2013-11-27 LAB — BASIC METABOLIC PANEL
BUN: 17 mg/dL (ref 6–23)
CO2: 21 mEq/L (ref 19–32)
Calcium: 9.2 mg/dL (ref 8.4–10.5)
Chloride: 105 mEq/L (ref 96–112)
Creatinine, Ser: 0.94 mg/dL (ref 0.50–1.10)
GFR calc Af Amer: 71 mL/min — ABNORMAL LOW (ref >90)
GFR calc non Af Amer: 61 mL/min — ABNORMAL LOW (ref >90)
Glucose, Bld: 139 mg/dL — ABNORMAL HIGH (ref 70–99)
Potassium: 4.3 mEq/L (ref 3.7–5.3)
Sodium: 137 mEq/L (ref 137–147)

## 2013-11-27 LAB — CBC
HCT: 34.7 % — ABNORMAL LOW (ref 36.0–46.0)
Hemoglobin: 11.7 g/dL — ABNORMAL LOW (ref 12.0–15.0)
MCH: 31.4 pg (ref 26.0–34.0)
MCHC: 33.7 g/dL (ref 30.0–36.0)
MCV: 93 fL (ref 78.0–100.0)
Platelets: 131 10*3/uL — ABNORMAL LOW (ref 150–400)
RBC: 3.73 MIL/uL — ABNORMAL LOW (ref 3.87–5.11)
RDW: 14.3 % (ref 11.5–15.5)
WBC: 3.7 10*3/uL — ABNORMAL LOW (ref 4.0–10.5)

## 2013-11-27 SURGERY — LEFT AND RIGHT HEART CATHETERIZATION WITH CORONARY ANGIOGRAM
Anesthesia: LOCAL

## 2013-11-27 MED ORDER — FENTANYL CITRATE 0.05 MG/ML IJ SOLN
INTRAMUSCULAR | Status: AC
  Filled 2013-11-27: qty 2

## 2013-11-27 MED ORDER — OXYCODONE-ACETAMINOPHEN 5-325 MG PO TABS: 1.0000 | ORAL_TABLET | ORAL | Status: DC | PRN

## 2013-11-27 MED ORDER — HEPARIN SODIUM (PORCINE) 1000 UNIT/ML IJ SOLN
INTRAMUSCULAR | Status: AC
  Filled 2013-11-27: qty 1

## 2013-11-27 MED ORDER — DIAZEPAM 5 MG PO TABS
5.0000 mg | ORAL_TABLET | ORAL | Status: AC
  Administered 2013-11-27: 5 mg via ORAL
  Filled 2013-11-27: qty 1

## 2013-11-27 MED ORDER — VERAPAMIL HCL 2.5 MG/ML IV SOLN
INTRAVENOUS | Status: AC
  Filled 2013-11-27: qty 2

## 2013-11-27 MED ORDER — SODIUM CHLORIDE 0.9 % IV SOLN
1.0000 mL/kg/h | INTRAVENOUS | Status: DC
  Administered 2013-11-27: 15:00:00 1 mL/kg/h via INTRAVENOUS

## 2013-11-27 MED ORDER — MIDAZOLAM HCL 2 MG/2ML IJ SOLN
INTRAMUSCULAR | Status: AC
  Filled 2013-11-27: qty 2

## 2013-11-27 MED ORDER — MORPHINE SULFATE 4 MG/ML IJ SOLN: 4.0000 mg | INTRAMUSCULAR | Status: DC | PRN

## 2013-11-27 MED ORDER — SODIUM CHLORIDE 0.9 % IJ SOLN: 3.0000 mL | Freq: Two times a day (BID) | INTRAMUSCULAR | Status: DC

## 2013-11-27 MED ORDER — SODIUM CHLORIDE 0.9 % IV SOLN: 250.0000 mL | INTRAVENOUS | Status: DC | PRN

## 2013-11-27 MED ORDER — SODIUM CHLORIDE 0.9 % IJ SOLN
3.0000 mL | INTRAMUSCULAR | Status: DC | PRN
Start: 2013-11-27 — End: 2013-11-27

## 2013-11-27 NOTE — CV Procedure (Signed)
Patient Name: [name] Date of Encounter: 11/27/2013  Principal Problem:   Vomiting and diarrhea Active Problems:   HYPERLIPIDEMIA   Unspecified essential hypertension   COPD   GERD   Severe aortic valve stenosis   Elevated troponin   Nausea vomiting and diarrhea   Near syncope   Length of Stay: 2  SUBJECTIVE  No further vomiting or diarrhea  CURRENT MEDS . [START ON 11/28/2013] aspirin  81 mg Oral Daily  . atorvastatin  10 mg Oral q1800  . diltiazem  120 mg Oral QHS  . enoxaparin (LOVENOX) injection  40 mg Subcutaneous Q24H  . ipratropium  0.5 mg Nebulization Q6H  . levalbuterol  0.63 mg Nebulization Q6H  . mometasone-formoterol  1 puff Inhalation BID  . montelukast  10 mg Oral QHS  . pantoprazole  40 mg Oral QHS  . sodium chloride  3 mL Intravenous Q12H  . sodium chloride  3 mL Intravenous Q12H    OBJECTIVE   Intake/Output Summary (Last 24 hours) at 11/27/13 1150 Last data filed at 11/27/13 0504  Gross per 24 hour  Intake 755.68 ml  Output   1600 ml  Net -844.32 ml   Filed Weights   11/25/13 2150 11/26/13 0700 11/27/13 0504  Weight: 99.791 kg (220 lb) 105.597 kg (232 lb 12.8 oz) 106.369 kg (234 lb 8 oz)    PHYSICAL EXAM Filed Vitals:   11/27/13 0504 11/27/13 0726 11/27/13 0729 11/27/13 0755  BP: 123/59 118/55 95/60   Pulse: 80 78 87   Temp: 97.6 F (36.4 C)     TempSrc: Oral     Resp: 18     Height:      Weight: 106.369 kg (234 lb 8 oz)     SpO2: 94%   98%   General: Alert, oriented x3, no distress  Head: no evidence of trauma, PERRL, EOMI, no exophtalmos or lid lag, no myxedema, no xanthelasma; normal ears, nose and oropharynx  Neck: Normal jugular venous pulsations and no hepatojugular reflux; brisk carotid pulses without delay and no carotid bruits  Chest: clear to auscultation, no signs of consolidation by percussion or palpation, normal fremitus, symmetrical and full respiratory excursions  Cardiovascular: normal position and quality  of the apical impulse, regular rhythm, normal first heart sound and soft second heart sound, no rubs or gallops, 3/6 midpeaking aortic ejection murmur, no diastolic rumble  Abdomen: no tenderness or distention, no masses by palpation, no abnormal pulsatility or arterial bruits, normal bowel sounds, no hepatosplenomegaly  Extremities: no clubbing, cyanosis; no edema; 2+ radial, ulnar and brachial pulses bilaterally; 2+ right femoral, posterior tibial and dorsalis pedis pulses; 2+ left femoral, posterior tibial and dorsalis pedis pulses; no subclavian or femoral bruits  Neurological: grossly nonfocal   LABS  CBC  Recent Labs  11/25/13 2210 11/27/13 0620  WBC 11.1* 3.7*  NEUTROABS 10.4*  --   HGB 13.5 11.7*  HCT 39.7 34.7*  MCV 93.6 93.0  PLT 163 485*   Basic Metabolic Panel  Recent Labs  11/25/13 2210 11/27/13 0620  NA 140 137  K 4.3 4.3  CL 103 105  CO2 23 21  GLUCOSE 108* 139*  BUN 21 17  CREATININE 0.91 0.94  CALCIUM 9.1 9.2   Liver Function Tests  Recent Labs  11/25/13 2210  AST 16  ALT 9  ALKPHOS 99  BILITOT 0.6  PROT 7.1  ALBUMIN 3.2*   No results found for this basename: LIPASE, AMYLASE,  in the last 72 hours  Cardiac Enzymes  Recent Labs  11/26/13 0200 11/26/13 0713 11/26/13 1350  TROPONINI <0.30 <0.30 <0.30   Radiology Studies Imaging results have been reviewed and No results found.   ASSESSMENT AND PLAN Proceed with left and right heart cath as planned, to evaluate AS and MS.   Sanda Klein, MD, [facility] [phone] office [phone] pager 11/27/2013 11:50 AM

## 2013-11-27 NOTE — Progress Notes (Signed)
[facility] PROGRESS NOTE  SAN RUA YBO:[id number] DOB: 1946-09-19 DOA: 11/25/2013 PCP: Adella Hare, MD  Assessment/Plan: nausea/ vomiting/ diarrhea Likely viral gastroenteritis. C. difficile PCR is negative resolved  syncope Questionable etiology. May have been vasovagal however patient with a history of severe aortic stenosis which could have been etiology.  Carotid Dopplers Bilateral : 40-59% internal carotid artery stenosis Patient with recent 2-D echo and a such we'll not repeat.  Serial cardiac enzymes are now negative Cards to do cath today  elevated troponin Patient denies any active chest pain. Serial enzymes have been negative. Follow. Cardiology for problem #2.  hypertension Stable. Continue Cardizem.  COPD Patient with minimal wheezing on exam. Continued sclerae and Spiriva. We'll place on nebs as needed. We'll give a dose of IV Solu-Medrol x1.  severe aortic stenosis Patient with severe aortic stenosis and primary cardiologist considering valvular repair/replacement.  TEE to assess degree of intervalvular fibrosa calcification prior to surgical planning  U/a with multiple colonies - no symptoms  prophylaxis Lovenox for DVT prophylaxis.  Code Status: Full Family Communication: Updated patient Disposition Plan: Home when medically stable.   Consultants:  Cardiology pending  Procedures:  None    Antibiotics:  None  HPI/Subjective: Patient with no further nausea or vomiting or diarrhea No chest pain- up walking around  Objective: Filed Vitals:   11/27/13 0729  BP: 95/60  Pulse: 87  Temp:   Resp:     Intake/Output Summary (Last 24 hours) at 11/27/13 0747 Last data filed at 11/27/13 0504  Gross per 24 hour  Intake 755.68 ml  Output   1600 ml  Net -844.32 ml   Filed Weights   11/25/13 2150 11/26/13 0700 11/27/13 0504  Weight: 99.791 kg (220 lb) 105.597 kg (232 lb 12.8 oz) 106.369 kg (234 lb 8 oz)     Exam:   General:  NAD  Cardiovascular: RRR 3/6 sem  Respiratory: Minimal expiratory wheezing  Abdomen: Soft, nontender, nondistended, positive bowel sounds.  Musculoskeletal: No clubbing cyanosis or edema.  Data Reviewed: Basic Metabolic Panel:  Recent Labs Lab 11/25/13 2210 11/27/13 0620  NA 140 137  K 4.3 4.3  CL 103 105  CO2 23 21  GLUCOSE 108* 139*  BUN 21 17  CREATININE 0.91 0.94  CALCIUM 9.1 9.2   Liver Function Tests:  Recent Labs Lab 11/25/13 2210  AST 16  ALT 9  ALKPHOS 99  BILITOT 0.6  PROT 7.1  ALBUMIN 3.2*   No results found for this basename: LIPASE, AMYLASE,  in the last 168 hours No results found for this basename: AMMONIA,  in the last 168 hours CBC:  Recent Labs Lab 11/25/13 2210 11/27/13 0620  WBC 11.1* 3.7*  NEUTROABS 10.4*  --   HGB 13.5 11.7*  HCT 39.7 34.7*  MCV 93.6 93.0  PLT 163 131*   Cardiac Enzymes:  Recent Labs Lab 11/26/13 0200 11/26/13 0713 11/26/13 1350  TROPONINI <0.30 <0.30 <0.30   BNP (last 3 results)  Recent Labs  10/10/13 1312 10/12/13 0230  PROBNP >70000.0* 8290.0*   CBG: No results found for this basename: GLUCAP,  in the last 168 hours  Recent Results (from the past 240 hour(s))  URINE CULTURE     Status: None   Collection Time    11/25/13 10:57 PM      Result Value Ref Range Status   Specimen Description URINE, RANDOM   Final   Special Requests NONE   Final   Culture  Setup Time  Final   Value: 11/26/2013 03:50     Performed at [place]     Final   Value: >=100,000 COLONIES/ML     Performed at [facility]   Culture     Final   Value: Multiple bacterial morphotypes present, none predominant. Suggest appropriate recollection if clinically indicated.     Performed at [facility]   Report Status 11/26/2013 FINAL   Final  CLOSTRIDIUM DIFFICILE BY PCR     Status: None   Collection Time    11/26/13  2:35 AM      Result Value Ref Range  Status   C difficile by pcr NEGATIVE  NEGATIVE Final     Studies: No results found.  Scheduled Meds: . [START ON 11/28/2013] aspirin  81 mg Oral Daily  . atorvastatin  10 mg Oral q1800  . diltiazem  120 mg Oral QHS  . enoxaparin (LOVENOX) injection  40 mg Subcutaneous Q24H  . ipratropium  0.5 mg Nebulization Q6H  . levalbuterol  0.63 mg Nebulization Q6H  . mometasone-formoterol  1 puff Inhalation BID  . montelukast  10 mg Oral QHS  . pantoprazole  40 mg Oral QHS  . sodium chloride  3 mL Intravenous Q12H  . sodium chloride  3 mL Intravenous Q12H   Continuous Infusions: . sodium chloride 1 mL/kg/hr (11/27/13 0515)    Principal Problem:   Vomiting and diarrhea Active Problems:   HYPERLIPIDEMIA   Unspecified essential hypertension   COPD   GERD   Severe aortic valve stenosis   Elevated troponin   Nausea vomiting and diarrhea   Near syncope    Time spent: 25 mins    Eliseo Squires Diem Dicocco DO [facility] Pager [phone]. If 7PM-7AM, please contact night-coverage at [email or web address], password [facility] 11/27/2013, 7:47 AM  LOS: 2 days

## 2013-11-27 NOTE — CV Procedure (Signed)
CARDIAC CATHETERIZATION REPORT   Procedures performed:  1. Left heart catheterization  2. Selective coronary angiography  3. Left ventriculography   Reason for procedure:  Valvular heart disease Syncope  Procedure performed by: Sanda Klein, MD, [facility]  Complications: none   Estimated blood loss: less than 5 mL   History:  She was admitted with (near) syncope following vomiting and loose stools. She has recovered fully.  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 Her most recent echocardiogram showed severe aortic valve stenosis with mild aortic insufficiency. The estimated aortic valve area was around 0.7 cm square and the mean gradient was 40 mm Hg. In March of 2013 the mean aortic valve gradient was 51 mmHg (while anemic, secondary to colon cancer). The echo report also describes severe mitral stenosis, based on a mean gradient of 14 mm Hg, but I think this was exaggerated by the heart rate of 100 bpm when the study was performed. No mention was made of MR, previously described as mild to moderate.Echocardiography has shown severe calcification in the aortic and mitral valve annuli as well as in the intervalvular fibrosa and Dr. Cyndia Bent suggested that a TEE would be helpful in planning her valve surgery. Left ventricular systolic function has been normal.    Consent: The risks, benefits, and details of the procedure were explained to the patient. Risks including death, MI, stroke, bleeding, limb ischemia, renal failure and allergy were described and accepted by the patient. Informed written consent was obtained prior to proceeding.  Technique: The patient was brought to the cardiac catheterization laboratory in the fasting state. He was prepped and draped in the usual sterile fashion. Local anesthesia with 1% lidocaine was administered to the right wrist and right groin area. Using the modified Seldinger technique a 7 French right  common femoral vein sheath was introduced without difficulty. Attempts at radial artery cannulation were not successful and a 481F right common femoral artery sheath was placed. Right heart catheterization was performed with a 81F balloon tipped PA catheter. Cardiac output was calculated using both the Fick and the thermodilution methods. Under fluoroscopic guidance, using 5 [place] JL4, JR and angled pigtail catheters, selective cannulation of the left coronary artery, right coronary artery and left ventricle were respectively performed. A straight tipped guidewire and the JR catheter were used to cross the aortic valve,then exchanged over a long J-tipped guidewire for the pigtail catheter. Several coronary angiograms in a variety of projections were recorded, as well as a left ventriculogram in the RAO projection and an ascending aortogram in the LAO projection. Left ventricular pressure and a pull back to the aorta were recorded. No immediate complications occurred. At the end of the procedure, all catheters were removed. After the procedure, hemostasis will be achieved with manual pressure.  Contrast used: 120 mL Omnipaque  Hemodynamic findings:  Aortic pressure 113/65 (mean 87) mm Hg  Left ventricle 062/69 with end-diastolic pressure of 15 mm Hg PA wedge pressure a wave 31, v wave 31 (mean 26) mm Hg Pulmonary artery 49/24 (mean 34) mm Hg Right ventricle 51/7 with an end-diastolic pressure of 9 mm Hg Right atrium a wave 13, v wave 14 (mean 11) mm Hg Cardiac output is 6.3 L per minute (cardiac index 2.9 L per minute per meter sq) - Fick Cardiac output is 7.0 L per minute (cardiac index 3.2 L per minute per meter sq) - Thermodilution  Aortic valve gradients - peak to  peak 74 mm Hg, , mean 48 mm Hg Aortic valve area ([place]) - 0.7 cm sq using the Fick CO, 0.8 cm sq using TD (index 0.3-0.35 cm sq/m sq BSA)  Mitral valve mean gradient (HR 90 bpm) - 11 mm Hg Mitral valve area 1.8 cm sq   Angiographic  Findings:  All her coronaries are very large in caliber. 1. The left main coronary artery is free of significant atherosclerosis and bifurcates in the usual fashion into the left anterior descending artery and left circumflex coronary artery.  2. The left anterior descending artery is a large vessel that reaches the apex and generates two major diagonal branches. There is evidence of minimal luminal irregularities and no calcification. No hemodynamically meaningful stenoses are seen. 3. The left circumflex coronary artery is a large-size vessel non dominant vessel that generates three major oblique marginal arteries. There is evidence of minimal luminal irregularities and no calcification. There is a 30% smooth stenosis in the distal vessel. No other hemodynamically meaningful stenoses are seen. 4. The right coronary artery is a very large-size dominant vessel that generates a long posterior lateral ventricular system as well as the PDA. There is evidence of minimal luminal irregularities and no calcification. No hemodynamically meaningful stenoses are seen.  5. The left ventricle is normal in size. The left ventricle systolic function is hyperdynamic with an estimated ejection fraction of >70%. Regional wall motion abnormalities are not seen. No left ventricular thrombus is seen. There is probably mild mitral insufficiency. The ascending aorta appears minimally dilated. Heavily calcified aortic valve leaflets with restricted mobility are seen by fluoroscopy. The mitral annulus is also heavily calcified. There is severe aortic valve stenosis by pullback. The left ventricular end-diastolic pressure is 15 mm Hg.    IMPRESSIONS:  No significant CAD. Hyperdynamic left ventricular function. Severe aortic stenosis. Moderate mitral valve stenosis. Mild pulmonary artery hypertension.   RECOMMENDATION:  TEE to clarify anatomy of aortic and mitral intervalvular fibrosa calcification. Refer back to Dr,. Bartle  to discuss AVR/MVR. Although MS is not critical, MV replacement may also be necessary due to the extent of heavy calcification between the two valves.     Sanda Klein, MD, [facility] [phone] office [phone] pager

## 2013-11-27 NOTE — Discharge Summary (Addendum)
Physician Discharge Summary  [name] RCV:[id number] DOB: [birth year] DOA: 11/25/2013  PCP: Adella Hare, MD  Admit date: 11/25/2013 Discharge date: 11/27/2013  Time spent: 35 minutes  Recommendations for Outpatient Follow-up:  1. Periodic CBC, BMP 2. TEE 3. Carotid duplex periodically- if worsens or get symptomatic- vascular referral  Discharge Diagnoses:  Principal Problem:   Vomiting and diarrhea Active Problems:   HYPERLIPIDEMIA   Unspecified essential hypertension   COPD   GERD   Severe aortic valve stenosis   Elevated troponin   Nausea vomiting and diarrhea   Near syncope   Discharge Condition: improved  Diet recommendation: cardiac  Filed Weights   11/25/13 2150 11/26/13 0700 11/27/13 0504  Weight: 99.791 kg (220 lb) 105.597 kg (232 lb 12.8 oz) 106.369 kg (234 lb 8 oz)    History of present illness:  [name] is a 68 y.o. female with extensive PMH-hyperlipidemia, COPD/asthma, not on nightly CPAP, RHD/severe aortic stenosis, hypertension, former smoker, obesity, pulmonary nodules, colon cancer status post surgery and chemotherapy, recently treated pneumonia, presented to the ED secondary to above complaints. She had been feeling poorly since 11/24/13. She had some hoarseness of voice and scratchy throat but no significant cough. She called her pulmonologist office and prescription for prednisone taper and oral Levaquin were called in which she has not yet started. Since 2/15, she started having nausea which seemed to improve on 2/16 a.m. and she tolerated breakfast. However since then she progressively had worsening nausea, dry heaving but no significant vomiting and 2-3 episodes of loose stools without blood or mucus. She denies fever, chills, abdominal pain, cough, dyspnea, chest pain or palpitations. Her appetite though has decreased. She was sitting on the toilet, had some dry heaves, started sweating profusely, felt dizzy, lightheaded and states that she  fell backwards and is not sure if she passed out or not. She states that even if she passed out it was briefly for a few seconds. She denies any injuries. In the ED, WBC count 11.1 and troponin mildly elevated at 0.12. She states that she feels better after nausea medications. Hospitalist admission requested   Hospital Course:  nausea/ vomiting/ diarrhea  Likely viral gastroenteritis. C. difficile PCR is negative  resolved   syncope  Questionable etiology. May have been vasovagal however patient with a history of severe aortic stenosis which could have been etiology.  Carotid Dopplers Bilateral : 40-59% internal carotid artery stenosis  Patient with recent 2-D echo and a such we'll not repeat.  Serial cardiac enzymes are now negative  Tele ok S/p cath TEE before valve surgery  elevated troponin  Patient denies any active chest pain. Serial enzymes have been negative.  hypertension  Stable. Continue Cardizem   COPD  stable. Continued   Spiriva. We'll place on nebs as needed. S/p one dose of IV Solu-Medrol x1.   severe aortic stenosis  Patient with severe aortic stenosis and primary cardiologist considering valvular repair/replacement.  TEE to assess degree of intervalvular fibrosa calcification prior to surgical planning   U/a with multiple colonies  - no symptoms      Procedures: Cath: Severe AS, moderate to severe MS, no significant CAD   Consultations:  cards  Discharge Exam: Filed Vitals:   11/27/13 1429  BP: 118/63  Pulse: 86  Temp: 98.6 F (37 C)  Resp: 18      Discharge Instructions      Discharge Orders   Future Appointments Provider Department Dept Phone   12/05/2013 9:00 AM  Melvenia Needles, NP [facility] [phone]   12/26/2013 3:15 PM [facility] [phone]   12/26/2013 3:45 PM Ladell Pier, MD [facility] [phone]   Future Orders Complete By  Expires   Diet - low sodium heart healthy  As directed    Discharge instructions  As directed    Comments:     Cbc, bmp 1 week no driving until seen by PCP   Increase activity slowly  As directed        Medication List    STOP taking these medications       levofloxacin 500 MG tablet  Commonly known as:  LEVAQUIN     methylPREDNISolone 4 MG tablet  Commonly known as:  MEDROL DOSEPAK     polyethylene glycol packet  Commonly known as:  MIRALAX / GLYCOLAX      TAKE these medications       albuterol 108 (90 BASE) MCG/ACT inhaler  Commonly known as:  PROVENTIL HFA;VENTOLIN HFA  Inhale 2 puffs into the lungs every 6 (six) hours as needed for wheezing.     aspirin 81 MG tablet  Take 81 mg by mouth at bedtime.     CALCIUM 600 + D PO  Take 1,200 mg by mouth at bedtime.     diltiazem 120 MG 24 hr capsule  Commonly known as:  CARDIZEM CD  Take 120 mg by mouth at bedtime.     Fluticasone Furoate-Vilanterol 100-25 MCG/INH Aepb  Commonly known as:  BREO ELLIPTA  Inhale 1 puff into the lungs daily.     lovastatin 40 MG tablet  Commonly known as:  MEVACOR  Take 40 mg by mouth at bedtime.     montelukast 10 MG tablet  Commonly known as:  SINGULAIR  Take 10 mg by mouth at bedtime.     pantoprazole 40 MG tablet  Commonly known as:  PROTONIX  Take 40 mg by mouth at bedtime.     tiotropium 18 MCG inhalation capsule  Commonly known as:  SPIRIVA  Place 18 mcg into inhaler and inhale daily.       Allergies  Allergen Reactions  . Codeine Nausea Only  . Prednisone Hives    In different places all over the body.  . Sulfonamide Derivatives Nausea Only      The results of significant diagnostics from this hospitalization (including imaging, microbiology, ancillary and laboratory) are listed below for reference.    Significant Diagnostic Studies: Dg Chest 2 View  11/19/2013   CLINICAL DATA:  Followup pneumonia, shortness of breath, history COPD, hypertension, asthma   EXAM: CHEST  2 VIEW  COMPARISON:  10/22/2013  FINDINGS: Enlargement of cardiac silhouette.  Mediastinal contours and pulmonary vascularity normal.  Minimal right apical pleural thickening stable.  Emphysematous and bronchitic changes consistent with COPD.  Mild residual right basilar infiltrate.  Minimal scarring at left base.  Remaining lungs clear.  No pneumothorax.  Question minimal right pleural effusion.  Mitral annular calcification again noted.  IMPRESSION: COPD changes with mild residual infiltrate at right base and question minimal right pleural effusion.   Electronically Signed   By: Lavonia Dana [name]   On: 11/19/2013 16:07    Microbiology: Recent Results (from the past 240 hour(s))  URINE CULTURE     Status: None   Collection Time    11/25/13 10:57 PM      Result Value Ref Range Status   Specimen  Description URINE, RANDOM   Final   Special Requests NONE   Final   Culture  Setup Time     Final   Value: 11/26/2013 03:50     Performed at [place]     Final   Value: >=100,000 COLONIES/ML     Performed at [facility]   Culture     Final   Value: Multiple bacterial morphotypes present, none predominant. Suggest appropriate recollection if clinically indicated.     Performed at [facility]   Report Status 11/26/2013 FINAL   Final  CLOSTRIDIUM DIFFICILE BY PCR     Status: None   Collection Time    11/26/13  2:35 AM      Result Value Ref Range Status   C difficile by pcr NEGATIVE  NEGATIVE Final     Labs: Basic Metabolic Panel:  Recent Labs Lab 11/25/13 2210 11/27/13 0620  NA 140 137  K 4.3 4.3  CL 103 105  CO2 23 21  GLUCOSE 108* 139*  BUN 21 17  CREATININE 0.91 0.94  CALCIUM 9.1 9.2   Liver Function Tests:  Recent Labs Lab 11/25/13 2210  AST 16  ALT 9  ALKPHOS 99  BILITOT 0.6  PROT 7.1  ALBUMIN 3.2*   No results found for this basename: LIPASE, AMYLASE,  in the last 168 hours No results found for this basename:  AMMONIA,  in the last 168 hours CBC:  Recent Labs Lab 11/25/13 2210 11/27/13 0620  WBC 11.1* 3.7*  NEUTROABS 10.4*  --   HGB 13.5 11.7*  HCT 39.7 34.7*  MCV 93.6 93.0  PLT 163 131*   Cardiac Enzymes:  Recent Labs Lab 11/26/13 0200 11/26/13 0713 11/26/13 1350  TROPONINI <0.30 <0.30 <0.30   BNP: BNP (last 3 results)  Recent Labs  10/10/13 1312 10/12/13 0230  PROBNP >70000.0* 8290.0*   CBG: No results found for this basename: GLUCAP,  in the last 168 hours     Signed:  Eulogio Bear  [facility] 11/27/2013, 2:44 PM

## 2013-11-27 NOTE — Brief Op Note (Signed)
11/25/2013 - 11/27/2013  11:59 AM  PATIENT:  [name]  68 y.o. female  PRE-OPERATIVE DIAGNOSIS:  AS, MS  POST-OPERATIVE DIAGNOSIS: Severe AS, moderate to severe MS, no significant CAD  PROCEDURE:  Procedure(s): LEFT AND RIGHT HEART CATHETERIZATION WITH CORONARY ANGIOGRAM (N/A)  SURGEON:  Surgeon(s) and Role:    * Nevaan Bunton, MD - Primary  ANESTHESIA:   local and IV sedation  EBL:  10 mL  PLAN OF CARE: DC home after bedrest, schedule for outpatient TEE prior to valve surgery  PATIENT DISPOSITION:  PACU - hemodynamically stable.   Delay start of Pharmacological VTE agent (>24hrs) due to surgical blood loss or risk of bleeding: yes Sanda Klein, MD, [facility] [phone] office [phone] pager

## 2013-11-27 NOTE — Progress Notes (Signed)
Order to start second peripheral IV, avoiding wrist for cardiac cath procedure.  Patient stated she has "hard to find" veins.  IV team contacted.  Second peripheral IV placed in right hand, unable to find vein in right AC or FA.

## 2013-11-28 ENCOUNTER — Emergency Department (HOSPITAL_COMMUNITY): Payer: Medicare Other

## 2013-11-28 ENCOUNTER — Encounter (HOSPITAL_COMMUNITY): Payer: Self-pay | Admitting: Emergency Medicine

## 2013-11-28 ENCOUNTER — Other Ambulatory Visit: Payer: Self-pay | Admitting: *Deleted

## 2013-11-28 ENCOUNTER — Inpatient Hospital Stay (HOSPITAL_COMMUNITY)
Admission: EM | Admit: 2013-11-28 | Discharge: 2013-12-02 | DRG: 281 | Disposition: A | Discharge status: Home / Self Care | Payer: Medicare Other | Attending: Cardiovascular Disease | Admitting: Cardiovascular Disease

## 2013-11-28 DIAGNOSIS — E669 Obesity, unspecified: Secondary | ICD-10-CM | POA: Diagnosis present

## 2013-11-28 DIAGNOSIS — R7989 Other specified abnormal findings of blood chemistry: Secondary | ICD-10-CM

## 2013-11-28 DIAGNOSIS — J4489 Other specified chronic obstructive pulmonary disease: Secondary | ICD-10-CM | POA: Diagnosis present

## 2013-11-28 DIAGNOSIS — I509 Heart failure, unspecified: Secondary | ICD-10-CM | POA: Diagnosis present

## 2013-11-28 DIAGNOSIS — I059 Rheumatic mitral valve disease, unspecified: Secondary | ICD-10-CM

## 2013-11-28 DIAGNOSIS — J449 Chronic obstructive pulmonary disease, unspecified: Secondary | ICD-10-CM | POA: Diagnosis present

## 2013-11-28 DIAGNOSIS — Z87891 Personal history of nicotine dependence: Secondary | ICD-10-CM

## 2013-11-28 DIAGNOSIS — I080 Rheumatic disorders of both mitral and aortic valves: Secondary | ICD-10-CM | POA: Diagnosis present

## 2013-11-28 DIAGNOSIS — I10 Essential (primary) hypertension: Secondary | ICD-10-CM | POA: Diagnosis present

## 2013-11-28 DIAGNOSIS — R778 Other specified abnormalities of plasma proteins: Secondary | ICD-10-CM

## 2013-11-28 DIAGNOSIS — E785 Hyperlipidemia, unspecified: Secondary | ICD-10-CM | POA: Diagnosis present

## 2013-11-28 DIAGNOSIS — E876 Hypokalemia: Secondary | ICD-10-CM | POA: Diagnosis not present

## 2013-11-28 DIAGNOSIS — I5031 Acute diastolic (congestive) heart failure: Secondary | ICD-10-CM

## 2013-11-28 DIAGNOSIS — I350 Nonrheumatic aortic (valve) stenosis: Secondary | ICD-10-CM

## 2013-11-28 DIAGNOSIS — I099 Rheumatic heart disease, unspecified: Secondary | ICD-10-CM | POA: Diagnosis present

## 2013-11-28 DIAGNOSIS — I359 Nonrheumatic aortic valve disorder, unspecified: Secondary | ICD-10-CM

## 2013-11-28 DIAGNOSIS — J441 Chronic obstructive pulmonary disease with (acute) exacerbation: Secondary | ICD-10-CM | POA: Diagnosis not present

## 2013-11-28 DIAGNOSIS — I214 Non-ST elevation (NSTEMI) myocardial infarction: Secondary | ICD-10-CM | POA: Diagnosis present

## 2013-11-28 DIAGNOSIS — G4730 Sleep apnea, unspecified: Secondary | ICD-10-CM | POA: Diagnosis present

## 2013-11-28 DIAGNOSIS — J9620 Acute and chronic respiratory failure, unspecified whether with hypoxia or hypercapnia: Secondary | ICD-10-CM

## 2013-11-28 DIAGNOSIS — R0902 Hypoxemia: Secondary | ICD-10-CM | POA: Diagnosis present

## 2013-11-28 HISTORY — DX: Rheumatic heart disease, unspecified: I09.9

## 2013-11-28 LAB — I-STAT TROPONIN, ED: Troponin i, poc: 0.4 ng/mL (ref 0.00–0.08)

## 2013-11-28 LAB — CBC
HCT: 35.1 % — ABNORMAL LOW (ref 36.0–46.0)
HCT: 33.5 % — ABNORMAL LOW (ref 36.0–46.0)
Hemoglobin: 11.8 g/dL — ABNORMAL LOW (ref 12.0–15.0)
Hemoglobin: 11.2 g/dL — ABNORMAL LOW (ref 12.0–15.0)
MCH: 31.7 pg (ref 26.0–34.0)
MCH: 31.5 pg (ref 26.0–34.0)
MCHC: 33.6 g/dL (ref 30.0–36.0)
MCHC: 33.4 g/dL (ref 30.0–36.0)
MCV: 94.4 fL (ref 78.0–100.0)
MCV: 94.1 fL (ref 78.0–100.0)
Platelets: 126 10*3/uL — ABNORMAL LOW (ref 150–400)
Platelets: 132 10*3/uL — ABNORMAL LOW (ref 150–400)
RBC: 3.72 MIL/uL — ABNORMAL LOW (ref 3.87–5.11)
RBC: 3.56 MIL/uL — ABNORMAL LOW (ref 3.87–5.11)
RDW: 14.6 % (ref 11.5–15.5)
RDW: 14.8 % (ref 11.5–15.5)
WBC: 12.7 10*3/uL — ABNORMAL HIGH (ref 4.0–10.5)
WBC: 10.6 10*3/uL — ABNORMAL HIGH (ref 4.0–10.5)

## 2013-11-28 LAB — PRO B NATRIURETIC PEPTIDE: Pro B Natriuretic peptide (BNP): 3226 pg/mL — ABNORMAL HIGH (ref 0–125)

## 2013-11-28 LAB — BASIC METABOLIC PANEL
BUN: 18 mg/dL (ref 6–23)
CO2: 23 mEq/L (ref 19–32)
Calcium: 9.2 mg/dL (ref 8.4–10.5)
Chloride: 102 mEq/L (ref 96–112)
Creatinine, Ser: 0.95 mg/dL (ref 0.50–1.10)
GFR calc Af Amer: 70 mL/min — ABNORMAL LOW (ref >90)
GFR calc non Af Amer: 60 mL/min — ABNORMAL LOW (ref >90)
Glucose, Bld: 107 mg/dL — ABNORMAL HIGH (ref 70–99)
Potassium: 3.9 mEq/L (ref 3.7–5.3)
Sodium: 139 mEq/L (ref 137–147)

## 2013-11-28 LAB — MRSA PCR SCREENING: MRSA by PCR: NEGATIVE

## 2013-11-28 LAB — CREATININE, SERUM
Creatinine, Ser: 0.93 mg/dL (ref 0.50–1.10)
GFR calc Af Amer: 72 mL/min — ABNORMAL LOW (ref >90)
GFR calc non Af Amer: 62 mL/min — ABNORMAL LOW (ref >90)

## 2013-11-28 LAB — TROPONIN I
Troponin I: 0.5 ng/mL (ref <0.30)
Troponin I: 0.57 ng/mL (ref <0.30)
Troponin I: 0.76 ng/mL (ref <0.30)

## 2013-11-28 MED ORDER — FUROSEMIDE 10 MG/ML IJ SOLN
20.0000 mg | Freq: Once | INTRAMUSCULAR | Status: AC
  Administered 2013-11-28: 20 mg via INTRAVENOUS
  Filled 2013-11-28: qty 2

## 2013-11-28 MED ORDER — SIMVASTATIN 10 MG PO TABS
10.0000 mg | ORAL_TABLET | Freq: Every day | ORAL | Status: DC
  Administered 2013-11-28 – 2013-12-01 (×4): 10 mg via ORAL
  Filled 2013-11-28 (×6): qty 1

## 2013-11-28 MED ORDER — SODIUM CHLORIDE 0.9 % IJ SOLN
3.0000 mL | INTRAMUSCULAR | Status: DC | PRN
  Administered 2013-11-30: 3 mL via INTRAVENOUS

## 2013-11-28 MED ORDER — HEPARIN SODIUM (PORCINE) 5000 UNIT/ML IJ SOLN
5000.0000 [IU] | Freq: Three times a day (TID) | INTRAMUSCULAR | Status: DC
  Administered 2013-11-28 – 2013-12-02 (×12): 5000 [IU] via SUBCUTANEOUS
  Filled 2013-11-28 (×15): qty 1

## 2013-11-28 MED ORDER — TIOTROPIUM BROMIDE MONOHYDRATE 18 MCG IN CAPS
18.0000 ug | ORAL_CAPSULE | Freq: Every day | RESPIRATORY_TRACT | Status: DC
  Administered 2013-11-30 – 2013-12-01 (×2): 18 ug via RESPIRATORY_TRACT
  Filled 2013-11-28: qty 5

## 2013-11-28 MED ORDER — ALBUTEROL SULFATE (2.5 MG/3ML) 0.083% IN NEBU
3.0000 mL | INHALATION_SOLUTION | Freq: Four times a day (QID) | RESPIRATORY_TRACT | Status: DC | PRN
  Administered 2013-11-29: 3 mL via RESPIRATORY_TRACT

## 2013-11-28 MED ORDER — SODIUM CHLORIDE 0.9 % IV SOLN: 250.0000 mL | INTRAVENOUS | Status: DC | PRN

## 2013-11-28 MED ORDER — FUROSEMIDE 40 MG PO TABS
40.0000 mg | ORAL_TABLET | Freq: Every day | ORAL | Status: DC
  Administered 2013-11-29 – 2013-12-02 (×4): 40 mg via ORAL
  Filled 2013-11-28 (×4): qty 1

## 2013-11-28 MED ORDER — ASPIRIN EC 81 MG PO TBEC
81.0000 mg | DELAYED_RELEASE_TABLET | Freq: Every day | ORAL | Status: DC
  Administered 2013-11-28 – 2013-12-01 (×4): 81 mg via ORAL
  Filled 2013-11-28 (×5): qty 1

## 2013-11-28 MED ORDER — PANTOPRAZOLE SODIUM 40 MG PO TBEC
40.0000 mg | DELAYED_RELEASE_TABLET | Freq: Every day | ORAL | Status: DC
  Administered 2013-11-28 – 2013-12-01 (×4): 40 mg via ORAL
  Filled 2013-11-28 (×4): qty 1

## 2013-11-28 MED ORDER — DILTIAZEM HCL ER COATED BEADS 120 MG PO CP24
120.0000 mg | ORAL_CAPSULE | Freq: Every day | ORAL | Status: DC
  Administered 2013-11-28 – 2013-12-01 (×4): 120 mg via ORAL
  Filled 2013-11-28 (×6): qty 1

## 2013-11-28 MED ORDER — SODIUM CHLORIDE 0.9 % IV SOLN
250.0000 mL | INTRAVENOUS | Status: DC | PRN
  Administered 2013-11-29: 250 mL via INTRAVENOUS

## 2013-11-28 MED ORDER — MOMETASONE FURO-FORMOTEROL FUM 100-5 MCG/ACT IN AERO
1.0000 | INHALATION_SPRAY | Freq: Two times a day (BID) | RESPIRATORY_TRACT | Status: DC
  Administered 2013-11-28 – 2013-12-01 (×6): 1 via RESPIRATORY_TRACT
  Filled 2013-11-28: qty 8.8

## 2013-11-28 MED ORDER — SODIUM CHLORIDE 0.9 % IJ SOLN
3.0000 mL | Freq: Two times a day (BID) | INTRAMUSCULAR | Status: DC
Start: 2013-11-28 — End: 2013-12-02
  Administered 2013-11-29 – 2013-12-02 (×6): 3 mL via INTRAVENOUS

## 2013-11-28 MED ORDER — SODIUM CHLORIDE 0.9 % IJ SOLN
3.0000 mL | Freq: Two times a day (BID) | INTRAMUSCULAR | Status: DC
  Administered 2013-11-28: 3 mL via INTRAVENOUS
  Administered 2013-11-29: 08:00:00 via INTRAVENOUS

## 2013-11-28 MED ORDER — FLUTICASONE FUROATE-VILANTEROL 100-25 MCG/INH IN AEPB: 1.0000 | INHALATION_SPRAY | Freq: Every day | RESPIRATORY_TRACT | Status: DC

## 2013-11-28 MED ORDER — ALBUTEROL SULFATE (2.5 MG/3ML) 0.083% IN NEBU
5.0000 mg | INHALATION_SOLUTION | Freq: Once | RESPIRATORY_TRACT | Status: AC
  Administered 2013-11-28: 5 mg via RESPIRATORY_TRACT
  Filled 2013-11-28: qty 6

## 2013-11-28 MED ORDER — SODIUM CHLORIDE 0.9 % IJ SOLN: 3.0000 mL | INTRAMUSCULAR | Status: DC | PRN

## 2013-11-28 MED ORDER — ACETAMINOPHEN 325 MG PO TABS: 650.0000 mg | ORAL_TABLET | ORAL | Status: DC | PRN

## 2013-11-28 MED ORDER — MONTELUKAST SODIUM 10 MG PO TABS
10.0000 mg | ORAL_TABLET | Freq: Every day | ORAL | Status: DC
Start: 2013-11-28 — End: 2013-12-02
  Administered 2013-11-28 – 2013-12-01 (×4): 10 mg via ORAL
  Filled 2013-11-28 (×5): qty 1

## 2013-11-28 MED ORDER — POTASSIUM CHLORIDE CRYS ER 20 MEQ PO TBCR
20.0000 meq | EXTENDED_RELEASE_TABLET | Freq: Every day | ORAL | Status: DC
  Administered 2013-11-28 – 2013-11-29 (×2): 20 meq via ORAL
  Filled 2013-11-28 (×2): qty 1

## 2013-11-28 NOTE — ED Notes (Signed)
Dr.Lockwood at bedside  

## 2013-11-28 NOTE — ED Notes (Signed)
Called main lab, they can add Troponin onto blood already sent down.

## 2013-11-28 NOTE — ED Notes (Signed)
Reports being dc from hospital yesterday, had cath done. Onset this am of sob, denies any cp. spo2 80% on room air at triage, ekg done.

## 2013-11-28 NOTE — ED Notes (Signed)
Pt requesting bedside commode and water. Bedside commode set up, with tissue bedside it.

## 2013-11-28 NOTE — ED Notes (Signed)
Pt finished nebulizer treatment. Reports she is feeling a lot better. Pt still has exp. wheezing but moving more air than earlier. Pt's husband given recliner chair so he can get a little sleep.

## 2013-11-28 NOTE — ED Provider Notes (Signed)
CSN: [id number]     Arrival date & time 11/28/13  0708 History   First MD Initiated Contact with Patient 11/28/13 [phone]     Chief Complaint  Patient presents with  . Shortness of Breath     HPI  Patient p/w dyspnea. Notably, she was d/c yesterday after being admitted four days ago after syncope. She states that after d/c yesterday she was essentially well.  Over the interval hours she developed SOB.  Initially this was mild, but progressed to severe just PTA. No associated pain, nausea, lh or near-syncope. No clear alleviating or exacerbating factors.   Past Medical History  Diagnosis Date  . IBS (irritable bowel syndrome)   . Hyperlipidemia   . Allergic rhinitis   . History of pneumonia   . COPD (chronic obstructive pulmonary disease)   . Asthma   . Heart murmur     Aortic  . Lung nodule     RLL nodule-31mm stable 2006, April 2009, and June 2009  . Candidiasis of breast   . Anemia   . Hyperlipemia   . Obesity   . Unspecified essential hypertension   . Heart valve problem     PER CARDIOLOGIST 05-03-2011  . Sleep difficulties   . Hemorrhoids   . Sleep apnea     PT TOLD BORDERLINE-TRIED CPAP-DID NOT HELP-SHE DOES NOT USE CPAP  . Arthritis   . 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 History of radiation therapy 05/30/11 to 07/07/11    rectum  . Prophylactic chemotherapy     Will finish chemo on 03/04/12  . Pneumonia     hx of several times   . Shingles   . rectal ca 04/2011  . Severe aortic valve stenosis 10/01/2013  . Shortness of breath    Past Surgical History  Procedure Laterality Date  . Foot surgery  left foot    hammer toe  . Hand surgery  Bil    carpal tunnel  . Back surgery      2006 SPACER---2007 LUMBAR FUSION  . Tonsillectomy    . Colon resection  08/25/2011    Procedure: COLON RESECTION LAPAROSCOPIC;  Surgeon: [name], MD;  Location: [facility];  Service: General;  Laterality: N/A;  Laparoscopic Assisted Low Anterior Resection Diverting Ostomy and onQ pain pump   . Ileostomy  08/25/2011  . Colon surgery    . Ileostomy closure  04/02/2012    Procedure: ILEOSTOMY TAKEDOWN;  Surgeon: [name], MD;  Location: [facility];  Service: General;  Laterality: N/A;  . Bowel resection  04/02/2012    Procedure: SMALL BOWEL RESECTION;  Surgeon: [name], MD;  Location: [facility];  Service: General;  Laterality: N/A;  . Carpal tunnel release     Family History  Problem Relation Age of Onset  . Lung cancer Father   . Cancer Father     lung  . Diabetes    . Heart disease    . Heart disease Mother   . Diabetes Mother   . Hypertension Mother   . Colon cancer Neg Hx   . Esophageal cancer Neg Hx   . Stomach cancer Neg Hx   . Rectal cancer Neg Hx    History  Substance Use Topics  . Smoking status: Former Smoker -- 1.50 packs/day for 10 years    Types: Cigarettes    Quit date: 12/27/1974  . Smokeless tobacco: Never Used  . Alcohol Use: No     Comment: rare   OB History  Grav Para Term Preterm Abortions TAB SAB Ect Mult Living                 Review of Systems  Constitutional:       Per HPI, otherwise negative  HENT:       Per HPI, otherwise negative  Respiratory:       Per HPI, otherwise negative  Cardiovascular:       Per HPI, otherwise negative  Gastrointestinal: Negative for vomiting.  Endocrine:       Negative aside from HPI  Genitourinary:       Neg aside from HPI   Musculoskeletal:       Per HPI, otherwise negative  Skin: Negative.   Neurological: Positive for syncope and light-headedness.      Allergies  Codeine; Prednisone; and Sulfonamide derivatives  Home Medications   Current Outpatient Rx  Name  Route  Sig  Dispense  Refill  . albuterol (PROVENTIL HFA;VENTOLIN HFA) 108 (90 BASE) MCG/ACT inhaler   Inhalation   Inhale 2 puffs into the lungs every 6 (six) hours as needed for wheezing.         Marland Kitchen aspirin 81 MG tablet   Oral   Take 81 mg by mouth at bedtime.          . Calcium Carbonate-Vitamin D (CALCIUM 600 + D  PO)   Oral   Take 1,200 mg by mouth at bedtime.          Marland Kitchen diltiazem (CARDIZEM CD) 120 MG 24 hr capsule   Oral   Take 120 mg by mouth at bedtime.          . Fluticasone Furoate-Vilanterol (BREO ELLIPTA) 100-25 MCG/INH AEPB   Inhalation   Inhale 1 puff into the lungs daily.   30 each   5   . lovastatin (MEVACOR) 40 MG tablet   Oral   Take 40 mg by mouth at bedtime.         . montelukast (SINGULAIR) 10 MG tablet   Oral   Take 10 mg by mouth at bedtime.         . pantoprazole (PROTONIX) 40 MG tablet   Oral   Take 40 mg by mouth at bedtime.          Marland Kitchen tiotropium (SPIRIVA) 18 MCG inhalation capsule   Inhalation   Place 18 mcg into inhaler and inhale daily.          BP 143/67  Pulse 109  Temp(Src) 97.6 F (36.4 C) (Oral)  SpO2 96% Physical Exam  Nursing note and vitals reviewed. Constitutional: She is oriented to person, place, and time. She appears well-developed and well-nourished. No distress.  HENT:  Head: Normocephalic and atraumatic.  Eyes: Conjunctivae and EOM are normal.  Cardiovascular: Regular rhythm and intact distal pulses.  Tachycardia present.   Murmur heard.  Systolic murmur is present with a grade of 3/6  Pulmonary/Chest: Effort normal and breath sounds normal. No stridor. No respiratory distress.  Abdominal: She exhibits no distension.  Musculoskeletal: She exhibits no edema.  Neurological: She is alert and oriented to person, place, and time. No cranial nerve deficit.  Skin: Skin is warm and dry.  Psychiatric: She has a normal mood and affect.    ED Course  Procedures (including critical care time) Labs Review Labs Reviewed  CBC  BASIC METABOLIC PANEL  PRO B NATRIURETIC PEPTIDE   Imaging Review Dg Chest Port 1 View  11/28/2013   CLINICAL DATA:  Shortness  of breath.  EXAM: PORTABLE CHEST - 1 VIEW  COMPARISON:  PA and lateral chest 11/19/2013 and 10/22/2013.  FINDINGS: Small right effusion and basilar airspace disease are again  seen. The left lung is clear. Heart size is upper normal. No pneumothorax.  IMPRESSION: No marked change in a small right effusion and basilar airspace disease.   Electronically Signed   By: Inge Rise [name]   On: 11/28/2013 07:31   I reviewed the XR - e/o R pleural effusion - abnormal     EKG Interpretation    Date/Time:  Thursday November 28 2013 07:14:27 EST Ventricular Rate:  115 PR Interval:  138 QRS Duration: 158 QT Interval:  364 QTC Calculation: 503 R Axis:   -100 Text Interpretation:  Sinus tachycardia Right bundle branch block Possible Lateral infarct , age undetermined Abnormal ECG Sinus tachycardia Non-specific intra-ventricular conduction delay No significant change since last tracing Abnormal ekg Confirmed by Carmin Muskrat  MD ([id number]) on 11/28/2013 7:25:35 AM           O2- 96%[place], abn.  I reviewed the patient's chart, including last admit (cath, labs, xr)  POC troponin is 0.4 - consistent with recent cath.  Lab trop pending. No CP   9:26 AM Patient improved.   I discussed her case with cardiology  Update: On secondary exam after albuterol therapy the patient states that she feels better.  Labs notable for elevated troponin, elevated BNP.  With yesterday's unremarkable cath, there is concern for diffuse injury likely secondary to notable aortic stenosis. MDM   Final diagnoses:  Acute diastolic CHF (congestive heart failure), NYHA class 4  Mitral valve disorders  Aortic valve disorders    Patient presents with dyspnea when after having catheterization.  On exam the patient is initially uncomfortable appearing, with audible wheezing.  Patient improved clinically, but her labs suggest ongoing valvular disease / CHF requiring admission for appropriate evaluation and management.   Carmin Muskrat, MD 11/28/13 1540

## 2013-11-28 NOTE — H&P (Signed)
Patient ID: [name] MRN: [id number] DOB/AGE: 1946-01-10 68 y.o. Admit date: 11/28/2013  Primary Care Physician:Cobi Aldape Norins, MD Primary Cardiologist: Dr Sallyanne Kuster  Active Problems:   * No active hospital problems. *  HPI: This is a 67 year old woman with rheumatic heart disease presenting with acute shortness of breath. The patient has been noted to have severe aortic stenosis and moderate mitral stenosis. She underwent diagnostic right and left heart catheterization yesterday as part of her preoperative workup. She was recovering from the procedure well until about 10:00 last night when she developed acute shortness of breath. She used her rescue inhaler without much improvement. Her breathing worsened overnight and she noted that her oxygen saturation was in the range of 75-80% early this morning. She continued to use her rescue inhaler with only a little bit of improvement. She had no chest pain or pressure. She denies lightheadedness, presyncope, edema, or abdominal swelling. She does have associated orthopnea and PND.  Notable findings in the emergency department include the following:  Chest x-ray shows a small right pleural effusion and basilar airspace disease. No significant change from previous study  Troponin is 0.5  BNP 3226  White blood cell count 12.7  The patient remains dyspneic at rest. She denies fevers, chills, or cough. She has no abdominal pain. She has no pain at her cath site. She has no other complaints at present.   Past Medical History  Diagnosis Date  . IBS (irritable bowel syndrome)   . Hyperlipidemia   . Allergic rhinitis   . History of pneumonia   . COPD (chronic obstructive pulmonary disease)   . Asthma   . Heart murmur     Aortic  . Lung nodule     RLL nodule-39mm stable 2006, April 2009, and June 2009  . Candidiasis of breast   . Anemia   . Hyperlipemia   . Obesity   . Unspecified essential hypertension   . Heart valve problem      PER CARDIOLOGIST 05-03-2011  . Sleep difficulties   . Hemorrhoids   . Sleep apnea     PT TOLD BORDERLINE-TRIED CPAP-DID NOT HELP-SHE DOES NOT USE CPAP  . Arthritis   . 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 History of radiation therapy 05/30/11 to 07/07/11    rectum  . Prophylactic chemotherapy     Will finish chemo on 03/04/12  . Pneumonia     hx of several times   . Shingles   . rectal ca 04/2011  . Severe aortic valve stenosis 10/01/2013  . Shortness of breath     Past Surgical History  Procedure Laterality Date  . Foot surgery  left foot    hammer toe  . Hand surgery  Bil    carpal tunnel  . Back surgery      2006 SPACER---2007 LUMBAR FUSION  . Tonsillectomy    . Colon resection  08/25/2011    Procedure: COLON RESECTION LAPAROSCOPIC;  Surgeon: Stark Klein, MD;  Location: [facility];  Service: General;  Laterality: N/A;  Laparoscopic Assisted Low Anterior Resection Diverting Ostomy and onQ pain pump  . Ileostomy  08/25/2011  . Colon surgery    . Ileostomy closure  04/02/2012    Procedure: ILEOSTOMY TAKEDOWN;  Surgeon: Stark Klein, MD;  Location: [facility];  Service: General;  Laterality: N/A;  . Bowel resection  04/02/2012    Procedure: SMALL BOWEL RESECTION;  Surgeon: Stark Klein, MD;  Location: [facility];  Service: General;  Laterality: N/A;  .  Carpal tunnel release      Family History  Problem Relation Age of Onset  . Lung cancer Father   . Cancer Father     lung  . Diabetes    . Heart disease    . Heart disease Mother   . Diabetes Mother   . Hypertension Mother   . Colon cancer Neg Hx   . Esophageal cancer Neg Hx   . Stomach cancer Neg Hx   . Rectal cancer Neg Hx     History   Social History  . Marital Status: Married    Spouse Name: N/A    Number of Children: N/A  . Years of Education: N/A   Occupational History  . retired    Social History Main Topics  . Smoking status: Former Smoker -- 1.50 packs/day for 10 years    Types: Cigarettes    Quit date: 12/27/1974    . Smokeless tobacco: Never Used  . Alcohol Use: No     Comment: rare  . Drug Use: No  . Sexual Activity: No   Other Topics Concern  . Not on file   Social History Narrative   Married 1966   2 sons; 1 grandchild   Retired-[employer]   Doing ok   March '09-financial tough times     Prior to Admission medications   Medication Sig Start Date End Date Taking? Authorizing Provider  albuterol (PROVENTIL HFA;VENTOLIN HFA) 108 (90 BASE) MCG/ACT inhaler Inhale 2 puffs into the lungs every 6 (six) hours as needed for wheezing.   Yes Historical Provider, MD  aspirin 81 MG tablet Take 81 mg by mouth at bedtime.    Yes Historical Provider, MD  Calcium Carbonate-Vitamin D (CALCIUM 600 + D PO) Take 1,200 mg by mouth at bedtime.    Yes Historical Provider, MD  diltiazem (CARDIZEM CD) 120 MG 24 hr capsule Take 120 mg by mouth at bedtime.    Yes Historical Provider, MD  Fluticasone Furoate-Vilanterol (BREO ELLIPTA) 100-25 MCG/INH AEPB Inhale 1 puff into the lungs daily. 07/15/13  Yes Brand Males, MD  lovastatin (MEVACOR) 40 MG tablet Take 40 mg by mouth at bedtime.   Yes Historical Provider, MD  montelukast (SINGULAIR) 10 MG tablet Take 10 mg by mouth at bedtime.   Yes Historical Provider, MD  pantoprazole (PROTONIX) 40 MG tablet Take 40 mg by mouth at bedtime.  05/03/11  Yes Historical Provider, MD  tiotropium (SPIRIVA) 18 MCG inhalation capsule Place 18 mcg into inhaler and inhale daily.   Yes Historical Provider, MD   ROS: General: no fevers/chills/night sweats/weight loss Eyes: no blurry vision, diplopia, or amaurosis ENT: no sore throat or hearing loss Resp: positive for cough, wheezing CV: no edema or palpitations GI: no abdominal pain, nausea, vomiting, diarrhea, or constipation GU: no dysuria, frequency, or hematuria Skin: no rash Neuro: no headache, numbness, tingling, or weakness of extremities Musculoskeletal: no joint pain or swelling Heme: no bleeding, DVT, or easy bruising Endo: no  polydipsia or polyuria  Physical Exam: Blood pressure 113/47, pulse 102, temperature 97.6 F (36.4 C), temperature source Oral, resp. rate 20, SpO2 100.00%.    Pt is alert and oriented, WD, WN, in no distress. HEENT: normal Neck: JVP normal. Carotid upstrokes delayed. No thyromegaly. Lungs: equal expansion, diffuse wheezing CV: Apex is discrete and nondisplaced, RRR with harsh grade 3/6 late-peaking systolic murmur heard throughout Abd: soft, NT, +BS, no bruit, no hepatosplenomegaly Back: no CVA tenderness Ext: no C/C/E  DP/PT pulses intact and = Skin: warm and dry without rash Neuro: CNII-XII intact             Strength intact = bilaterally  Labs:   Lab Results  Component Value Date   WBC 12.7* 11/28/2013   HGB 11.8* 11/28/2013   HCT 35.1* 11/28/2013   MCV 94.4 11/28/2013   PLT 126* 11/28/2013    Recent Labs Lab 11/25/13 2210  11/28/13 0735  NA 140  < > 139  K 4.3  < > 3.9  CL 103  < > 102  CO2 23  < > 23  BUN 21  < > 18  CREATININE 0.91  < > 0.95  CALCIUM 9.1  < > 9.2  PROT 7.1  --   --   BILITOT 0.6  --   --   ALKPHOS 99  --   --   ALT 9  --   --   AST 16  --   --   GLUCOSE 108*  < > 107*  < > = values in this interval not displayed. Lab Results  Component Value Date   CKTOTAL 112 10/11/2013   CKMB 4.6* 10/11/2013   TROPONINI 0.50* 11/28/2013    Lab Results  Component Value Date   CHOL 181 07/05/2013   Lab Results  Component Value Date   HDL 51 07/05/2013   Lab Results  Component Value Date   LDLCALC 107* 07/05/2013   Lab Results  Component Value Date   TRIG 117 07/05/2013   Lab Results  Component Value Date   CHOLHDL 3.5 07/05/2013   No results found for this basename: LDLDIRECT      Radiology: Dg Chest 2 View  11/19/2013   CLINICAL DATA:  Followup pneumonia, shortness of breath, history COPD, hypertension, asthma  EXAM: CHEST  2 VIEW  COMPARISON:  10/22/2013  FINDINGS: Enlargement of cardiac silhouette.  Mediastinal contours and pulmonary  vascularity normal.  Minimal right apical pleural thickening stable.  Emphysematous and bronchitic changes consistent with COPD.  Mild residual right basilar infiltrate.  Minimal scarring at left base.  Remaining lungs clear.  No pneumothorax.  Question minimal right pleural effusion.  Mitral annular calcification again noted.  IMPRESSION: COPD changes with mild residual infiltrate at right base and question minimal right pleural effusion.   Electronically Signed   By: Lavonia Dana [name]   On: 11/19/2013 16:07   Dg Chest Port 1 View  11/28/2013   CLINICAL DATA:  Shortness of breath.  EXAM: PORTABLE CHEST - 1 VIEW  COMPARISON:  PA and lateral chest 11/19/2013 and 10/22/2013.  FINDINGS: Small right effusion and basilar airspace disease are again seen. The left lung is clear. Heart size is upper normal. No pneumothorax.  IMPRESSION: No marked change in a small right effusion and basilar airspace disease.   Electronically Signed   By: Inge Rise [name]   On: 11/28/2013 07:31   EKG: Sinus tachycardia, RBBB, RAE  2-D echocardiogram 10/11/2013: Study Conclusions  - Left ventricle: The cavity size was normal. There was moderate concentric hypertrophy. Systolic function was vigorous. The estimated ejection fraction was in the range of 65% to 70%. Wall motion was normal; there were no regional wall motion abnormalities. LV diastolic function cannot be assessed due to mitral stenosis. - Aortic valve: Severe aortic valve calcification with restricted leaflet motion. No obvious large vegetations. The peak and mean gradients are 72 mmHg and 40 mmHg, respectively. Based on an LVOT diameter of 1.7 cm, the  calculated AVA is 0.6-0.7 cm2 - consistent with severe aortic stenosis. There is mild regurgitation. Valve area: 0.6cm^2(VTI). Valve area: 0.68cm^2 (Vmax). - Aorta: The aorta was mildly calcified. - Aortic root: The aortic root was normal in size. - Mitral valve: Severe mitral annular and  leaflet calcification (suggestive of rheumatic heart disease). Peak and mean gradients are 26 mmHg and 16 mmHg, respectively, consistent with severe mitral stenosis. Valve area by continuity equation (using LVOT flow): 1.06cm^2. Mild regurgitation. No obvious large valvular vegetation. - Right ventricle: The cavity size was mildly dilated. Systolic function was normal. RV systolic pressure: 60AV Hg (S, est). - Right atrium: The atrium was moderately dilated. - Tricuspid valve: Poorly visualized. Mild regurgitation. - Inferior vena cava: The vessel was dilated; the respirophasic diameter changes were blunted (< 50%); findings are consistent with elevated central venous pressure.  Cardiac catheterization 11/27/2013: CARDIAC CATHETERIZATION REPORT   Procedures performed:  1. Left heart catheterization  2. Selective coronary angiography  3. Left ventriculography  Reason for procedure:  Valvular heart disease  Syncope  Procedure performed by: Sanda Klein, MD, [facility]  Complications: none  Estimated blood loss: less than 5 mL  History: She was admitted with (near) syncope following vomiting and loose stools. She has recovered fully.  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 Her most recent echocardiogram showed severe aortic valve stenosis with mild aortic insufficiency. The estimated aortic valve area was around 0.7 cm square and the mean gradient was 40 mm Hg. In March of 2013 the mean aortic valve gradient was 51 mmHg (while anemic, secondary to colon cancer). The echo report also describes severe mitral stenosis, based on a mean gradient of 14 mm Hg, but I think this was exaggerated by the heart rate of 100 bpm when the study was performed. No mention was made of MR, previously described as mild to moderate.Echocardiography has shown severe calcification in the aortic and mitral valve annuli as well as in the intervalvular  fibrosa and Dr. Cyndia Bent suggested that a TEE would be helpful in planning her valve surgery. Left ventricular systolic function has been normal.  Consent: The risks, benefits, and details of the procedure were explained to the patient. Risks including death, MI, stroke, bleeding, limb ischemia, renal failure and allergy were described and accepted by the patient. Informed written consent was obtained prior to proceeding.  Technique: The patient was brought to the cardiac catheterization laboratory in the fasting state. He was prepped and draped in the usual sterile fashion. Local anesthesia with 1% lidocaine was administered to the right wrist and right groin area. Using the modified Seldinger technique a 7 French right common femoral vein sheath was introduced without difficulty. Attempts at radial artery cannulation were not successful and a 44F right common femoral artery sheath was placed. Right heart catheterization was performed with a 28F balloon tipped PA catheter. Cardiac output was calculated using both the Fick and the thermodilution methods. Under fluoroscopic guidance, using 5 [place] JL4, JR and angled pigtail catheters, selective cannulation of the left coronary artery, right coronary artery and left ventricle were respectively performed. A straight tipped guidewire and the JR catheter were used to cross the aortic valve,then exchanged over a long J-tipped guidewire for the pigtail catheter. Several coronary angiograms in a variety of projections were recorded, as well as a left ventriculogram in the RAO projection and an ascending aortogram in the LAO projection. Left ventricular pressure and a pull  back to the aorta were recorded. No immediate complications occurred. At the end of the procedure, all catheters were removed. After the procedure, hemostasis will be achieved with manual pressure.  Contrast used: 120 mL Omnipaque  Hemodynamic findings:  Aortic pressure 113/65 (mean 87) mm Hg  Left  ventricle [id number] with end-diastolic pressure of 15 mm Hg  PA wedge pressure a wave 31, v wave 31 (mean 26) mm Hg  Pulmonary artery 49/24 (mean 34) mm Hg  Right ventricle 51/7 with an end-diastolic pressure of 9 mm Hg  Right atrium a wave 13, v wave 14 (mean 11) mm Hg  Cardiac output is 6.3 L per minute (cardiac index 2.9 L per minute per meter sq) - Fick  Cardiac output is 7.0 L per minute (cardiac index 3.2 L per minute per meter sq) - Thermodilution  Aortic valve gradients - peak to peak 74 mm Hg, , mean 48 mm Hg  Aortic valve area ([place]) - 0.7 cm sq using the Fick CO, 0.8 cm sq using TD (index 0.3-0.35 cm sq/m sq BSA)  Mitral valve mean gradient (HR 90 bpm) - 11 mm Hg  Mitral valve area 1.8 cm sq  Angiographic Findings:  All her coronaries are very large in caliber.  1. The left main coronary artery is free of significant atherosclerosis and bifurcates in the usual fashion into the left anterior descending artery and left circumflex coronary artery.  2. The left anterior descending artery is a large vessel that reaches the apex and generates two major diagonal branches. There is evidence of minimal luminal irregularities and no calcification. No hemodynamically meaningful stenoses are seen.  3. The left circumflex coronary artery is a large-size vessel non dominant vessel that generates three major oblique marginal arteries. There is evidence of minimal luminal irregularities and no calcification. There is a 30% smooth stenosis in the distal vessel. No other hemodynamically meaningful stenoses are seen.  4. The right coronary artery is a very large-size dominant vessel that generates a long posterior lateral ventricular system as well as the PDA. There is evidence of minimal luminal irregularities and no calcification. No hemodynamically meaningful stenoses are seen.  5. The left ventricle is normal in size. The left ventricle systolic function is hyperdynamic with an estimated ejection  fraction of >70%. Regional wall motion abnormalities are not seen. No left ventricular thrombus is seen. There is probably mild mitral insufficiency. The ascending aorta appears minimally dilated. Heavily calcified aortic valve leaflets with restricted mobility are seen by fluoroscopy. The mitral annulus is also heavily calcified. There is severe aortic valve stenosis by pullback. The left ventricular end-diastolic pressure is 15 mm Hg.   IMPRESSIONS:  No significant CAD.  Hyperdynamic left ventricular function.  Severe aortic stenosis.  Moderate mitral valve stenosis.  Mild pulmonary artery hypertension.  RECOMMENDATION:  TEE to clarify anatomy of aortic and mitral intervalvular fibrosa calcification.  Refer back to Dr,. Bartle to discuss AVR/MVR. Although MS is not critical, MV replacement may also be necessary due to the extent of heavy calcification between the two valves.       ASSESSMENT AND PLAN:  1. Acute diastolic heart failure. Suspect shortness of breath and hypoxemia are related to combination of cardiac and pulmonary disease. She is actively wheezing on exam. However, with elevated BNP and elevated troponin in setting of normal coronaries/normal renal function, I think she has progressive symptoms of heart failure related to her valvular heart disease. In addition to severe AS, she has moderate mitral  stenosis and with continued albuterol use/tachycardia, this may be contributing to her heart failure.  Plan:  IV lasix. Will start with 20 mg as she is lasix-naive and has normal renal fxn.   TEE tomorrow to eval mitral valve disease  Consult TCTS (has seen Dr Cyndia Bent in 2014)  Admit to [facility] bed  2. COPD/Asthma. Continue home therapies. Consult pulmonary if no significant improvement with diuresis.   Signed: Sherren Mocha 11/28/2013, 10:21 AM

## 2013-11-28 NOTE — Progress Notes (Signed)
Scheduled for TEE 11/29/13 at 0900

## 2013-11-28 NOTE — ED Notes (Signed)
Portable xray at bedside.

## 2013-11-28 NOTE — ED Notes (Signed)
Results of troponin given to Dr. Vanita Panda

## 2013-11-28 NOTE — ED Notes (Signed)
Registration at bedside.

## 2013-11-28 NOTE — ED Notes (Signed)
Pharmacy tech at bedside 

## 2013-11-28 NOTE — ED Notes (Signed)
Pt c/o slight HA, reports she always gets one when she doesn't drink caffeine. Requesting tylenol. Will page admitting physician.

## 2013-11-29 ENCOUNTER — Encounter (HOSPITAL_COMMUNITY)
Admission: EM | Disposition: A | Discharge status: Home / Self Care | Payer: Self-pay | Source: Home / Self Care | Attending: Cardiovascular Disease

## 2013-11-29 ENCOUNTER — Other Ambulatory Visit (HOSPITAL_COMMUNITY): Payer: Self-pay | Admitting: Emergency Medicine

## 2013-11-29 ENCOUNTER — Inpatient Hospital Stay (HOSPITAL_COMMUNITY): Payer: Medicare Other

## 2013-11-29 ENCOUNTER — Inpatient Hospital Stay (HOSPITAL_COMMUNITY): Admit: 2013-11-29 | Payer: Medicare Other | Admitting: Cardiology

## 2013-11-29 DIAGNOSIS — R0602 Shortness of breath: Secondary | ICD-10-CM

## 2013-11-29 DIAGNOSIS — I059 Rheumatic mitral valve disease, unspecified: Secondary | ICD-10-CM

## 2013-11-29 DIAGNOSIS — I359 Nonrheumatic aortic valve disorder, unspecified: Secondary | ICD-10-CM

## 2013-11-29 HISTORY — PX: TEE WITHOUT CARDIOVERSION: SHX5443

## 2013-11-29 LAB — PULMONARY FUNCTION TEST
FEF 25-75 Post: 0.67 L/sec
FEF 25-75 Pre: 0.45 L/sec
FEF2575-%Change-Post: 49 %
FEF2575-%Pred-Post: 30 %
FEF2575-%Pred-Pre: 20 %
FEV1-%Change-Post: 12 %
FEV1-%Pred-Post: 38 %
FEV1-%Pred-Pre: 34 %
FEV1-Post: 1.05 L
FEV1-Pre: 0.93 L
FEV1FVC-%Change-Post: 4 %
FEV1FVC-%Pred-Pre: 82 %
FEV6-%Change-Post: 8 %
FEV6-%Pred-Post: 46 %
FEV6-%Pred-Pre: 43 %
FEV6-Post: 1.6 L
FEV6-Pre: 1.47 L
FEV6FVC-%Change-Post: 1 %
FEV6FVC-%Pred-Post: 104 %
FEV6FVC-%Pred-Pre: 102 %
FVC-%Change-Post: 7 %
FVC-%Pred-Post: 45 %
FVC-%Pred-Pre: 42 %
FVC-Post: 1.6 L
FVC-Pre: 1.49 L
Post FEV1/FVC ratio: 65 %
Post FEV6/FVC ratio: 100 %
Pre FEV1/FVC ratio: 63 %
Pre FEV6/FVC Ratio: 99 %

## 2013-11-29 LAB — TROPONIN I: Troponin I: 0.78 ng/mL (ref <0.30)

## 2013-11-29 LAB — BASIC METABOLIC PANEL
BUN: 15 mg/dL (ref 6–23)
CO2: 27 mEq/L (ref 19–32)
Calcium: 8.9 mg/dL (ref 8.4–10.5)
Chloride: 100 mEq/L (ref 96–112)
Creatinine, Ser: 0.95 mg/dL (ref 0.50–1.10)
GFR calc Af Amer: 70 mL/min — ABNORMAL LOW (ref >90)
GFR calc non Af Amer: 60 mL/min — ABNORMAL LOW (ref >90)
Glucose, Bld: 98 mg/dL (ref 70–99)
Potassium: 3.5 mEq/L — ABNORMAL LOW (ref 3.7–5.3)
Sodium: 140 mEq/L (ref 137–147)

## 2013-11-29 SURGERY — ECHOCARDIOGRAM, TRANSESOPHAGEAL
Anesthesia: Moderate Sedation

## 2013-11-29 MED ORDER — FENTANYL CITRATE 0.05 MG/ML IJ SOLN
INTRAMUSCULAR | Status: DC | PRN
  Administered 2013-11-29: 25 ug via INTRAVENOUS
  Administered 2013-11-29 (×2): 12.5 ug via INTRAVENOUS

## 2013-11-29 MED ORDER — POTASSIUM CHLORIDE CRYS ER 20 MEQ PO TBCR
40.0000 meq | EXTENDED_RELEASE_TABLET | Freq: Every day | ORAL | Status: DC
  Administered 2013-11-30 – 2013-12-02 (×3): 40 meq via ORAL
  Filled 2013-11-29 (×4): qty 2

## 2013-11-29 MED ORDER — MIDAZOLAM HCL 10 MG/2ML IJ SOLN
INTRAMUSCULAR | Status: DC | PRN
  Administered 2013-11-29: 2 mg via INTRAVENOUS
  Administered 2013-11-29 (×2): 1 mg via INTRAVENOUS

## 2013-11-29 NOTE — Consult Note (Signed)
[address]       [place] [address]             [phone]      Cardiothoracic Surgery Consult  Reason for Consult: Severe aortic stenosis and moderate mitral stenosis with acute diastolic congestive heart failure.  Referring Physician:  Dr. Dani Gobble Croitoru  [name] is an 68 y.o. female.  HPI:   She is well-known to me from prior consultation in June 2014 for evaluation of symptomatic severe AS and moderate MS. 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 She was treated with chemotherapy and radiation therapy. She has been followed by Dr. Rollene Fare for her valvular heart disease and was seen back recently on 03/01/2013. Her last 2-D echocardiogram on 11/16/2012 was reviewed by me. There was a technically difficult study but showed severe aortic stenosis with a moderately calcified aortic annulus. There was moderate calcific mitral stenosis and the left ventricular ejection fraction was 55-60% with normal wall motion. It was not possible to see detail of the valve structure. Her main symptom is exertional dyspnea. She is a Psychologist, occupational in the surgical waiting room and says that if she has to make a couple of trips between there and the recovery room she gets very short of breath. She get short of breath with going up stairs. Surgical treatment was recommended but she wanted to wait until after May of this year due to an important social function that she was planning. Cardiac cath was planned for January but she developed pneumonia and it had to be postponed. She was admitted earlier this week with nausea, dry heaves and loose stools. She developed diaphoresis, dizziness, and may have had brief  syncopal episode while sitting on the toilet. Troponin was mildly elevated at 0.12. Her GI symptoms resolved quickly and C. Diff was negative. She had cath performed while she was here which showed no significant coronary disease, moderate mitral stenosis, severe AS, normal LV function, and mild pulmonary HTN. She was discharged home and felt well until that night when she developed acute shortness of breath not improved with her rescue inhaler. Her breathing continued to worsen overnight with decrease in her sats to 75-80% by the morning. Since admission and diuresis her breathing is back to baseline. TEE planned to assess mitral valve.   Past Medical History  Diagnosis Date  . IBS (irritable bowel syndrome)   . Hyperlipidemia   . Allergic rhinitis   . History of pneumonia   . COPD (chronic obstructive pulmonary disease)   . Asthma   . Heart murmur     Aortic  . Lung nodule     RLL nodule-13m stable 2006, April 2009, and June 2009  . Candidiasis of breast   . Anemia   . Hyperlipemia   . Obesity   . Unspecified essential hypertension   . Heart valve problem     PER CARDIOLOGIST 05-03-2011  . Sleep difficulties   . Hemorrhoids   . Sleep apnea     PT TOLD BORDERLINE-TRIED CPAP-DID NOT HELP-SHE DOES NOT USE CPAP  . Arthritis   . 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 History of radiation therapy 05/30/11 to 07/07/11    rectum  .  Prophylactic chemotherapy     Will finish chemo on 03/04/12  . Pneumonia     hx of several times   . Shingles   . rectal ca 04/2011  . Severe aortic valve stenosis 10/01/2013  . Shortness of breath   . Rheumatic heart disease     Past Surgical History  Procedure Laterality Date  . Foot surgery  left foot    hammer toe  . Hand surgery  Bil    carpal tunnel  . Back surgery      2006 SPACER---2007 LUMBAR FUSION  . Tonsillectomy    . Colon resection  08/25/2011    Procedure: COLON RESECTION LAPAROSCOPIC;  Surgeon: Stark Klein, MD;  Location: [facility];  Service:  General;  Laterality: N/A;  Laparoscopic Assisted Low Anterior Resection Diverting Ostomy and onQ pain pump  . Ileostomy  08/25/2011  . Colon surgery    . Ileostomy closure  04/02/2012    Procedure: ILEOSTOMY TAKEDOWN;  Surgeon: Stark Klein, MD;  Location: [facility];  Service: General;  Laterality: N/A;  . Bowel resection  04/02/2012    Procedure: SMALL BOWEL RESECTION;  Surgeon: Stark Klein, MD;  Location: [facility];  Service: General;  Laterality: N/A;  . Carpal tunnel release    . Cardiac catheterization  11/27/2013    Family History  Problem Relation Age of Onset  . Lung cancer Father   . Cancer Father     lung  . Diabetes    . Heart disease    . Heart disease Mother   . Diabetes Mother   . Hypertension Mother   . Colon cancer Neg Hx   . Esophageal cancer Neg Hx   . Stomach cancer Neg Hx   . Rectal cancer Neg Hx     Social History:  reports that she quit smoking about 38 years ago. Her smoking use included Cigarettes. She has a 15 pack-year smoking history. She has never used smokeless tobacco. She reports that she does not drink alcohol or use illicit drugs.   Works as a Psychologist, occupational at [employer] at [employer].  Allergies:  Allergies  Allergen Reactions  . Codeine Nausea Only  . Prednisone Hives    In different places all over the body.  . Sulfonamide Derivatives Nausea Only    Medications:  I have reviewed the patient's current medications. Prior to Admission:  Prescriptions prior to admission  Medication Sig Dispense Refill  . albuterol (PROVENTIL HFA;VENTOLIN HFA) 108 (90 BASE) MCG/ACT inhaler Inhale 2 puffs into the lungs every 6 (six) hours as needed for wheezing.      Marland Kitchen aspirin 81 MG tablet Take 81 mg by mouth at bedtime.       . Calcium Carbonate-Vitamin D (CALCIUM 600 + D PO) Take 1,200 mg by mouth at bedtime.       Marland Kitchen diltiazem (CARDIZEM CD) 120 MG 24 hr capsule Take 120 mg by mouth at bedtime.       . Fluticasone Furoate-Vilanterol (BREO  ELLIPTA) 100-25 MCG/INH AEPB Inhale 1 puff into the lungs daily.  30 each  5  . lovastatin (MEVACOR) 40 MG tablet Take 40 mg by mouth at bedtime.      . montelukast (SINGULAIR) 10 MG tablet Take 10 mg by mouth at bedtime.      . pantoprazole (PROTONIX) 40 MG tablet Take 40 mg by mouth at bedtime.       Marland Kitchen tiotropium (SPIRIVA) 18 MCG inhalation capsule Place 18 mcg into inhaler  and inhale daily.       Scheduled: . aspirin EC  81 mg Oral QHS  . diltiazem  120 mg Oral QHS  . furosemide  40 mg Oral Daily  . heparin  5,000 Units Subcutaneous 3 times per day  . mometasone-formoterol  1 puff Inhalation BID  . montelukast  10 mg Oral QHS  . pantoprazole  40 mg Oral QHS  . potassium chloride  20 mEq Oral Daily  . simvastatin  10 mg Oral q1800  . sodium chloride  3 mL Intravenous Q12H  . sodium chloride  3 mL Intravenous Q12H  . tiotropium  18 mcg Inhalation Daily   Continuous:  ELF:YBOFBP chloride, sodium chloride, acetaminophen, albuterol, sodium chloride, sodium chloride  Results for orders placed during the hospital encounter of 11/28/13 (from the past 48 hour(s))  CBC     Status: Abnormal   Collection Time    11/28/13  7:35 AM      Result Value Ref Range   WBC 12.7 (*) 4.0 - 10.5 K/uL   RBC 3.72 (*) 3.87 - 5.11 MIL/uL   Hemoglobin 11.8 (*) 12.0 - 15.0 g/dL   HCT 35.1 (*) 36.0 - 46.0 %   MCV 94.4  78.0 - 100.0 fL   MCH 31.7  26.0 - 34.0 pg   MCHC 33.6  30.0 - 36.0 g/dL   RDW 14.6  11.5 - 15.5 %   Platelets 126 (*) 150 - 400 K/uL  BASIC METABOLIC PANEL     Status: Abnormal   Collection Time    11/28/13  7:35 AM      Result Value Ref Range   Sodium 139  137 - 147 mEq/L   Potassium 3.9  3.7 - 5.3 mEq/L   Chloride 102  96 - 112 mEq/L   CO2 23  19 - 32 mEq/L   Glucose, Bld 107 (*) 70 - 99 mg/dL   BUN 18  6 - 23 mg/dL   Creatinine, Ser 0.95  0.50 - 1.10 mg/dL   Calcium 9.2  8.4 - 10.5 mg/dL   GFR calc non Af Amer 60 (*) >90 mL/min   GFR calc Af Amer 70 (*) >90 mL/min   Comment:  (NOTE)     The eGFR has been calculated using the CKD EPI equation.     This calculation has not been validated in all clinical situations.     eGFR's persistently <90 mL/min signify possible Chronic Kidney     Disease.  PRO B NATRIURETIC PEPTIDE     Status: Abnormal   Collection Time    11/28/13  7:35 AM      Result Value Ref Range   Pro B Natriuretic peptide (BNP) 3226.0 (*) 0 - 125 pg/mL  TROPONIN I     Status: Abnormal   Collection Time    11/28/13  7:35 AM      Result Value Ref Range   Troponin I 0.50 (*) <0.30 ng/mL   Comment:            Due to the release kinetics of cTnI,     a negative result within the first hours     of the onset of symptoms does not rule out     myocardial infarction with certainty.     If myocardial infarction is still suspected,     repeat the test at appropriate intervals.     CRITICAL RESULT CALLED TO, READ BACK BY AND VERIFIED WITH:     N.BULLOCK,RN [id number]  11/28/13 CLARK,S  Randolm Idol, ED     Status: Abnormal   Collection Time    11/28/13  7:45 AM      Result Value Ref Range   Troponin i, poc 0.40 (*) 0.00 - 0.08 ng/mL   Comment NOTIFIED PHYSICIAN     Comment 3            Comment: Due to the release kinetics of cTnI,     a negative result within the first hours     of the onset of symptoms does not rule out     myocardial infarction with certainty.     If myocardial infarction is still suspected,     repeat the test at appropriate intervals.  TROPONIN I     Status: Abnormal   Collection Time    11/28/13 12:23 PM      Result Value Ref Range   Troponin I 0.57 (*) <0.30 ng/mL   Comment:            Due to the release kinetics of cTnI,     a negative result within the first hours     of the onset of symptoms does not rule out     myocardial infarction with certainty.     If myocardial infarction is still suspected,     repeat the test at appropriate intervals.     CRITICAL VALUE NOTED.  VALUE IS CONSISTENT WITH PREVIOUSLY REPORTED AND  CALLED VALUE.  MRSA PCR SCREENING     Status: None   Collection Time    11/28/13  3:41 PM      Result Value Ref Range   MRSA by PCR NEGATIVE  NEGATIVE   Comment:            The GeneXpert MRSA Assay (FDA     approved for NASAL specimens     only), is one component of a     comprehensive MRSA colonization     surveillance program. It is not     intended to diagnose MRSA     infection nor to guide or     monitor treatment for     MRSA infections.  CBC     Status: Abnormal   Collection Time    11/28/13  4:12 PM      Result Value Ref Range   WBC 10.6 (*) 4.0 - 10.5 K/uL   RBC 3.56 (*) 3.87 - 5.11 MIL/uL   Hemoglobin 11.2 (*) 12.0 - 15.0 g/dL   HCT 33.5 (*) 36.0 - 46.0 %   MCV 94.1  78.0 - 100.0 fL   MCH 31.5  26.0 - 34.0 pg   MCHC 33.4  30.0 - 36.0 g/dL   RDW 14.8  11.5 - 15.5 %   Platelets 132 (*) 150 - 400 K/uL  CREATININE, SERUM     Status: Abnormal   Collection Time    11/28/13  4:12 PM      Result Value Ref Range   Creatinine, Ser 0.93  0.50 - 1.10 mg/dL   GFR calc non Af Amer 62 (*) >90 mL/min   GFR calc Af Amer 72 (*) >90 mL/min   Comment: (NOTE)     The eGFR has been calculated using the CKD EPI equation.     This calculation has not been validated in all clinical situations.     eGFR's persistently <90 mL/min signify possible Chronic Kidney     Disease.  TROPONIN I     Status: Abnormal  Collection Time    11/28/13  6:34 PM      Result Value Ref Range   Troponin I 0.76 (*) <0.30 ng/mL   Comment:            Due to the release kinetics of cTnI,     a negative result within the first hours     of the onset of symptoms does not rule out     myocardial infarction with certainty.     If myocardial infarction is still suspected,     repeat the test at appropriate intervals.     CRITICAL VALUE NOTED.  VALUE IS CONSISTENT WITH PREVIOUSLY REPORTED AND CALLED VALUE.  TROPONIN I     Status: Abnormal   Collection Time    11/29/13  1:29 AM      Result Value Ref Range    Troponin I 0.78 (*) <0.30 ng/mL   Comment:            Due to the release kinetics of cTnI,     a negative result within the first hours     of the onset of symptoms does not rule out     myocardial infarction with certainty.     If myocardial infarction is still suspected,     repeat the test at appropriate intervals.     CRITICAL VALUE NOTED.  VALUE IS CONSISTENT WITH PREVIOUSLY REPORTED AND CALLED VALUE.  BASIC METABOLIC PANEL     Status: Abnormal   Collection Time    11/29/13  1:29 AM      Result Value Ref Range   Sodium 140  137 - 147 mEq/L   Potassium 3.5 (*) 3.7 - 5.3 mEq/L   Chloride 100  96 - 112 mEq/L   CO2 27  19 - 32 mEq/L   Glucose, Bld 98  70 - 99 mg/dL   BUN 15  6 - 23 mg/dL   Creatinine, Ser 0.95  0.50 - 1.10 mg/dL   Calcium 8.9  8.4 - 10.5 mg/dL   GFR calc non Af Amer 60 (*) >90 mL/min   GFR calc Af Amer 70 (*) >90 mL/min   Comment: (NOTE)     The eGFR has been calculated using the CKD EPI equation.     This calculation has not been validated in all clinical situations.     eGFR's persistently <90 mL/min signify possible Chronic Kidney     Disease.    Dg Chest Port 1 View  11/28/2013   CLINICAL DATA:  Shortness of breath.  EXAM: PORTABLE CHEST - 1 VIEW  COMPARISON:  PA and lateral chest 11/19/2013 and 10/22/2013.  FINDINGS: Small right effusion and basilar airspace disease are again seen. The left lung is clear. Heart size is upper normal. No pneumothorax.  IMPRESSION: No marked change in a small right effusion and basilar airspace disease.   Electronically Signed   By: Inge Rise [name]   On: 11/28/2013 07:31    Review of Systems  Constitutional: Positive for malaise/fatigue and diaphoresis. Negative for fever, chills and weight loss.  HENT: Negative.   Eyes: Negative.   Respiratory: Positive for shortness of breath. Negative for cough.   Cardiovascular: Positive for orthopnea and PND. Negative for chest pain, palpitations and leg swelling.    Gastrointestinal:       Nausea, dry heaves, and loose stools earlier in the week resolved.  Genitourinary: Negative.   Musculoskeletal: Negative.   Neurological: Positive for dizziness.  Brief syncope on the toilet.  Endo/Heme/Allergies: Negative.   Psychiatric/Behavioral: Negative.    Blood pressure 148/52, pulse 87, temperature 97.5 F (36.4 C), temperature source Oral, resp. rate 18, height _0  (1.727 m), weight 104 kg (229 lb 4.5 oz), SpO2 99.00%. Physical Exam  Constitutional: She is oriented to person, place, and time. She appears well-developed and well-nourished. No distress.  HENT:  Head: Normocephalic and atraumatic.  Mouth/Throat: Oropharynx is clear and moist.  Eyes: Conjunctivae and EOM are normal. Pupils are equal, round, and reactive to light.  Neck: Normal range of motion. Neck supple. No JVD present. No thyromegaly present.  Cardiovascular: Normal rate, regular rhythm and intact distal pulses.   Murmur heard. 3/6 systolic murmur over aorta  Respiratory: Effort normal and breath sounds normal. No respiratory distress. She has no wheezes. She has no rales. She exhibits no tenderness.  GI: Soft. Bowel sounds are normal. She exhibits no distension and no mass. There is no tenderness.  Musculoskeletal: Normal range of motion. She exhibits no edema.  Lymphadenopathy:    She has no cervical adenopathy.  Neurological: She is alert and oriented to person, place, and time. She has normal strength. No cranial nerve deficit or sensory deficit.  Skin: Skin is warm and dry.  Psychiatric: She has a normal mood and affect. Her behavior is normal. Judgment and thought content normal.     CARDIAC CATHETERIZATION REPORT    Procedures performed:  1. Left heart catheterization  2. Selective coronary angiography  3. Left ventriculography  Reason for procedure:  Valvular heart disease  Syncope  Procedure performed by: Sanda Klein, MD, [facility]  Complications: none   Estimated blood loss: less than 5 mL  History: She was admitted with (near) syncope following vomiting and loose stools. She has recovered fully.  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 Her most recent echocardiogram showed severe aortic valve stenosis with mild aortic insufficiency. The estimated aortic valve area was around 0.7 cm square and the mean gradient was 40 mm Hg. In March of 2013 the mean aortic valve gradient was 51 mmHg (while anemic, secondary to colon cancer). The echo report also describes severe mitral stenosis, based on a mean gradient of 14 mm Hg, but I think this was exaggerated by the heart rate of 100 bpm when the study was performed. No mention was made of MR, previously described as mild to moderate.Echocardiography has shown severe calcification in the aortic and mitral valve annuli as well as in the intervalvular fibrosa and Dr. Cyndia Bent suggested that a TEE would be helpful in planning her valve surgery. Left ventricular systolic function has been normal.  Consent: The risks, benefits, and details of the procedure were explained to the patient. Risks including death, MI, stroke, bleeding, limb ischemia, renal failure and allergy were described and accepted by the patient. Informed written consent was obtained prior to proceeding.  Technique: The patient was brought to the cardiac catheterization laboratory in the fasting state. He was prepped and draped in the usual sterile fashion. Local anesthesia with 1% lidocaine was administered to the right wrist and right groin area. Using the modified Seldinger technique a 7 French right common femoral vein sheath was introduced without difficulty. Attempts at radial artery cannulation were not successful and a 70F right common femoral artery sheath was placed. Right heart catheterization was performed with a 35F balloon tipped PA catheter. Cardiac output was calculated using both  the  Fick and the thermodilution methods. Under fluoroscopic guidance, using 5 [place] JL4, JR and angled pigtail catheters, selective cannulation of the left coronary artery, right coronary artery and left ventricle were respectively performed. A straight tipped guidewire and the JR catheter were used to cross the aortic valve,then exchanged over a long J-tipped guidewire for the pigtail catheter. Several coronary angiograms in a variety of projections were recorded, as well as a left ventriculogram in the RAO projection and an ascending aortogram in the LAO projection. Left ventricular pressure and a pull back to the aorta were recorded. No immediate complications occurred. At the end of the procedure, all catheters were removed. After the procedure, hemostasis will be achieved with manual pressure.  Contrast used: 120 mL Omnipaque  Hemodynamic findings:  Aortic pressure 113/65 (mean 87) mm Hg  Left ventricle 540/98 with end-diastolic pressure of 15 mm Hg  PA wedge pressure a wave 31, v wave 31 (mean 26) mm Hg  Pulmonary artery 49/24 (mean 34) mm Hg  Right ventricle 51/7 with an end-diastolic pressure of 9 mm Hg  Right atrium a wave 13, v wave 14 (mean 11) mm Hg  Cardiac output is 6.3 L per minute (cardiac index 2.9 L per minute per meter sq) - Fick  Cardiac output is 7.0 L per minute (cardiac index 3.2 L per minute per meter sq) - Thermodilution  Aortic valve gradients - peak to peak 74 mm Hg, , mean 48 mm Hg  Aortic valve area ([place]) - 0.7 cm sq using the Fick CO, 0.8 cm sq using TD (index 0.3-0.35 cm sq/m sq BSA)  Mitral valve mean gradient (HR 90 bpm) - 11 mm Hg  Mitral valve area 1.8 cm sq  Angiographic Findings:  All her coronaries are very large in caliber.  1. The left main coronary artery is free of significant atherosclerosis and bifurcates in the usual fashion into the left anterior descending artery and left circumflex coronary artery.  2. The left anterior descending artery is a  large vessel that reaches the apex and generates two major diagonal branches. There is evidence of minimal luminal irregularities and no calcification. No hemodynamically meaningful stenoses are seen.  3. The left circumflex coronary artery is a large-size vessel non dominant vessel that generates three major oblique marginal arteries. There is evidence of minimal luminal irregularities and no calcification. There is a 30% smooth stenosis in the distal vessel. No other hemodynamically meaningful stenoses are seen.  4. The right coronary artery is a very large-size dominant vessel that generates a long posterior lateral ventricular system as well as the PDA. There is evidence of minimal luminal irregularities and no calcification. No hemodynamically meaningful stenoses are seen.  5. The left ventricle is normal in size. The left ventricle systolic function is hyperdynamic with an estimated ejection fraction of >70%. Regional wall motion abnormalities are not seen. No left ventricular thrombus is seen. There is probably mild mitral insufficiency. The ascending aorta appears minimally dilated. Heavily calcified aortic valve leaflets with restricted mobility are seen by fluoroscopy. The mitral annulus is also heavily calcified. There is severe aortic valve stenosis by pullback. The left ventricular end-diastolic pressure is 15 mm Hg.    IMPRESSIONS:  No significant CAD.  Hyperdynamic left ventricular function.  Severe aortic stenosis.  Moderate mitral valve stenosis.  Mild pulmonary artery hypertension.  RECOMMENDATION:  TEE to clarify anatomy of aortic and mitral intervalvular fibrosa calcification.  Refer back to Dr,. Ervey Fallin to discuss AVR/MVR. Although MS is not  critical, MV replacement may also be necessary due to the extent of heavy calcification between the two valves.       Sanda Klein, MD, [facility]  [facility]  [phone] office  [phone] pager        Assessment/Plan:  She has  severe aortic stenosis and moderate mitral stenosis by prior 2-D echo and cath with extensive calcification of the mitral annulus and intervalvular region. She has had stable exertional dyspnea until this acute episode post-cath which is probably acute diastolic heart failure related to the cath. She improved quickly with diuresis. She is having a TEE today. She wanted to wait until after May to have surgery but I don't think that is possible. I discussed that with her and she understands that and is agreeable to proceed soon. My schedule is full next week but I can do it the following week. Hopefully she can go home and come back then for surgery. She will require AVR and MVR. I would plan to use tissue valves given her age of 73 and history of rectal cancer, IBS, COPD.  Tnia Anglada K 11/29/2013, 9:29 AM

## 2013-11-29 NOTE — Progress Notes (Signed)
TELEMETRY: Reviewed telemetry pt in NSR: Filed Vitals:   11/29/13 0000 11/29/13 0400 11/29/13 0500 11/29/13 0600  BP: 125/51 123/45  111/51  Pulse: 85 77  66  Temp: 97 F (36.1 C) 96.4 F (35.8 C)    TempSrc: Oral Oral    Resp: 17 16  16   Height:      Weight:   229 lb 4.5 oz (104 kg)   SpO2: 99% 97%  99%    Intake/Output Summary (Last 24 hours) at 11/29/13 0747 Last data filed at 11/29/13 0600  Gross per 24 hour  Intake    240 ml  Output   1500 ml  Net  -1260 ml    SUBJECTIVE States breathing is much better today. No chest pain. For TEE this am.  LABS: Basic Metabolic Panel:  Recent Labs  11/28/13 0735 11/28/13 1612 11/29/13 0129  NA 139  --  140  K 3.9  --  3.5*  CL 102  --  100  CO2 23  --  27  GLUCOSE 107*  --  98  BUN 18  --  15  CREATININE 0.95 0.93 0.95  CALCIUM 9.2  --  8.9   Liver Function Tests: No results found for this basename: AST, ALT, ALKPHOS, BILITOT, PROT, ALBUMIN,  in the last 72 hours No results found for this basename: LIPASE, AMYLASE,  in the last 72 hours CBC:  Recent Labs  11/28/13 0735 11/28/13 1612  WBC 12.7* 10.6*  HGB 11.8* 11.2*  HCT 35.1* 33.5*  MCV 94.4 94.1  PLT 126* 132*   Cardiac Enzymes:  Recent Labs  11/28/13 1223 11/28/13 1834 11/29/13 0129  TROPONINI 0.57* 0.76* 0.78*   BNP: 3226   Radiology/Studies:  Dg Chest 2 View  11/19/2013   CLINICAL DATA:  Followup pneumonia, shortness of breath, history COPD, hypertension, asthma  EXAM: CHEST  2 VIEW  COMPARISON:  10/22/2013  FINDINGS: Enlargement of cardiac silhouette.  Mediastinal contours and pulmonary vascularity normal.  Minimal right apical pleural thickening stable.  Emphysematous and bronchitic changes consistent with COPD.  Mild residual right basilar infiltrate.  Minimal scarring at left base.  Remaining lungs clear.  No pneumothorax.  Question minimal right pleural effusion.  Mitral annular calcification again noted.  IMPRESSION: COPD changes with mild  residual infiltrate at right base and question minimal right pleural effusion.   Electronically Signed   By: Lavonia Dana [name]   On: 11/19/2013 16:07   Dg Chest Port 1 View  11/28/2013   CLINICAL DATA:  Shortness of breath.  EXAM: PORTABLE CHEST - 1 VIEW  COMPARISON:  PA and lateral chest 11/19/2013 and 10/22/2013.  FINDINGS: Small right effusion and basilar airspace disease are again seen. The left lung is clear. Heart size is upper normal. No pneumothorax.  IMPRESSION: No marked change in a small right effusion and basilar airspace disease.   Electronically Signed   By: Inge Rise [name]   On: 11/28/2013 07:31   Ecg: sinus tachy, RBBB  Carotid Doppler: Summary: Bilateral - 40% to 59% ICA stenosis. Vertebral artery flow is antegrade.  Echo:Study Conclusions  - Left ventricle: The cavity size was normal. There was moderate concentric hypertrophy. Systolic function was vigorous. The estimated ejection fraction was in the range of 65% to 70%. Wall motion was normal; there were no regional wall motion abnormalities. LV diastolic function cannot be assessed due to mitral stenosis. - Aortic valve: Severe aortic valve calcification with restricted leaflet motion. No obvious large vegetations. The  peak and mean gradients are 72 mmHg and 40 mmHg, respectively. Based on an LVOT diameter of 1.7 cm, the calculated AVA is 0.6-0.7 cm2 - consistent with severe aortic stenosis. There is mild regurgitation. Valve area: 0.6cm^2(VTI). Valve area: 0.68cm^2 (Vmax). - Aorta: The aorta was mildly calcified. - Aortic root: The aortic root was normal in size. - Mitral valve: Severe mitral annular and leaflet calcification (suggestive of rheumatic heart disease). Peak and mean gradients are 26 mmHg and 16 mmHg, respectively, consistent with severe mitral stenosis. Valve area by continuity equation (using LVOT flow): 1.06cm^2. Mild regurgitation. No obvious large valvular vegetation. - Right ventricle:  The cavity size was mildly dilated. Systolic function was normal. RV systolic pressure: 22WL Hg (S, est). - Right atrium: The atrium was moderately dilated. - Tricuspid valve: Poorly visualized. Mild regurgitation. - Inferior vena cava: The vessel was dilated; the respirophasic diameter changes were blunted (< 50%); findings are consistent with elevated central venous pressure.           Patient Information    Patient Name Sex DOB SSN   [name], [name] Female 1946-08-06 [id number]            CV Procedure by Sanda Klein, MD at 11/27/2013 4:32 PM    Author: Sanda Klein, MD Service: Cardiology Author Type: Physician   Filed: 11/27/2013 5:01 PM Note Time: 11/27/2013 4:32 PM Status: Signed   Editor: Sanda Klein, MD (Physician)      CARDIAC CATHETERIZATION REPORT    Procedures performed:  1. Left heart catheterization  2. Selective coronary angiography  3. Left ventriculography  Reason for procedure:  Valvular heart disease  Syncope  Procedure performed by: Sanda Klein, MD, [facility]  Complications: none  Estimated blood loss: less than 5 mL  History: She was admitted with (near) syncope following vomiting and loose stools. She has recovered fully.  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 Her most recent echocardiogram showed severe aortic valve stenosis with mild aortic insufficiency. The estimated aortic valve area was around 0.7 cm square and the mean gradient was 40 mm Hg. In March of 2013 the mean aortic valve gradient was 51 mmHg (while anemic, secondary to colon cancer). The echo report also describes severe mitral stenosis, based on a mean gradient of 14 mm Hg, but I think this was exaggerated by the heart rate of 100 bpm when the study was performed. No mention was made of MR, previously described as mild to moderate.Echocardiography has shown severe calcification in the aortic and mitral valve annuli  as well as in the intervalvular fibrosa and Dr. Cyndia Bent suggested that a TEE would be helpful in planning her valve surgery. Left ventricular systolic function has been normal.  Consent: The risks, benefits, and details of the procedure were explained to the patient. Risks including death, MI, stroke, bleeding, limb ischemia, renal failure and allergy were described and accepted by the patient. Informed written consent was obtained prior to proceeding.  Technique: The patient was brought to the cardiac catheterization laboratory in the fasting state. He was prepped and draped in the usual sterile fashion. Local anesthesia with 1% lidocaine was administered to the right wrist and right groin area. Using the modified Seldinger technique a 7 French right common femoral vein sheath was introduced without difficulty. Attempts at radial artery cannulation were not successful and a 21F right common femoral artery sheath was placed. Right heart catheterization was performed with a 58F  balloon tipped PA catheter. Cardiac output was calculated using both the Fick and the thermodilution methods. Under fluoroscopic guidance, using 5 [place] JL4, JR and angled pigtail catheters, selective cannulation of the left coronary artery, right coronary artery and left ventricle were respectively performed. A straight tipped guidewire and the JR catheter were used to cross the aortic valve,then exchanged over a long J-tipped guidewire for the pigtail catheter. Several coronary angiograms in a variety of projections were recorded, as well as a left ventriculogram in the RAO projection and an ascending aortogram in the LAO projection. Left ventricular pressure and a pull back to the aorta were recorded. No immediate complications occurred. At the end of the procedure, all catheters were removed. After the procedure, hemostasis will be achieved with manual pressure.  Contrast used: 120 mL Omnipaque  Hemodynamic findings:  Aortic pressure  113/65 (mean 87) mm Hg  Left ventricle [id number] with end-diastolic pressure of 15 mm Hg  PA wedge pressure a wave 31, v wave 31 (mean 26) mm Hg  Pulmonary artery 49/24 (mean 34) mm Hg  Right ventricle 51/7 with an end-diastolic pressure of 9 mm Hg  Right atrium a wave 13, v wave 14 (mean 11) mm Hg  Cardiac output is 6.3 L per minute (cardiac index 2.9 L per minute per meter sq) - Fick  Cardiac output is 7.0 L per minute (cardiac index 3.2 L per minute per meter sq) - Thermodilution  Aortic valve gradients - peak to peak 74 mm Hg, , mean 48 mm Hg  Aortic valve area ([place]) - 0.7 cm sq using the Fick CO, 0.8 cm sq using TD (index 0.3-0.35 cm sq/m sq BSA)  Mitral valve mean gradient (HR 90 bpm) - 11 mm Hg  Mitral valve area 1.8 cm sq  Angiographic Findings:  All her coronaries are very large in caliber.  1. The left main coronary artery is free of significant atherosclerosis and bifurcates in the usual fashion into the left anterior descending artery and left circumflex coronary artery.  2. The left anterior descending artery is a large vessel that reaches the apex and generates two major diagonal branches. There is evidence of minimal luminal irregularities and no calcification. No hemodynamically meaningful stenoses are seen.  3. The left circumflex coronary artery is a large-size vessel non dominant vessel that generates three major oblique marginal arteries. There is evidence of minimal luminal irregularities and no calcification. There is a 30% smooth stenosis in the distal vessel. No other hemodynamically meaningful stenoses are seen.  4. The right coronary artery is a very large-size dominant vessel that generates a long posterior lateral ventricular system as well as the PDA. There is evidence of minimal luminal irregularities and no calcification. No hemodynamically meaningful stenoses are seen.  5. The left ventricle is normal in size. The left ventricle systolic function is hyperdynamic with  an estimated ejection fraction of >70%. Regional wall motion abnormalities are not seen. No left ventricular thrombus is seen. There is probably mild mitral insufficiency. The ascending aorta appears minimally dilated. Heavily calcified aortic valve leaflets with restricted mobility are seen by fluoroscopy. The mitral annulus is also heavily calcified. There is severe aortic valve stenosis by pullback. The left ventricular end-diastolic pressure is 15 mm Hg.    IMPRESSIONS:  No significant CAD.  Hyperdynamic left ventricular function.  Severe aortic stenosis.  Moderate mitral valve stenosis.  Mild pulmonary artery hypertension.  RECOMMENDATION:  TEE to clarify anatomy of aortic and mitral intervalvular fibrosa calcification.  Refer back to Dr,. Bartle to discuss AVR/MVR. Although MS is not critical, MV replacement may also be necessary due to the extent of heavy calcification between the two valves.       Sanda Klein, MD, [facility]  [facility]  [phone] office  [phone] pager         PHYSICAL EXAM General: Well developed, well nourished, in no acute distress. Head: Normocephalic, atraumatic, sclera non-icteric, no xanthomas, nares are without discharge. Neck: Negative for carotid bruits. Carotid upstroke delayed.  JVD not elevated. Lungs: Mild scattered wheezes. Breathing is unlabored. Heart: RRR S1 S2 with harsh grade 3/6 systolic murmur RUSB>>apex. Abdomen: Soft, non-tender, non-distended with normoactive bowel sounds. No hepatomegaly. No rebound/guarding. No obvious abdominal masses. Msk:  Strength and tone appears normal for age. Extremities: No clubbing, cyanosis or edema.  Distal pedal pulses are 2+ and equal bilaterally. Neuro: Alert and oriented X 3. Moves all extremities spontaneously. Psych:  Responds to questions appropriately with a normal affect.  ASSESSMENT AND PLAN: 1. Acute diastolic CHF related to severe valvular heart disease. Exacerbated by recent cath.  Improved with diuresis. For TEE today to assess MV disease. Patient seen by Dr. Cyndia Bent. His schedule is full next week. Plan is to potentially DC later this weekend to return as outpatient for surgery. Will transfer to telemetry today post TEE. 2. NSTEMI.  Normal coronaries on cath. Due to demand from CHF. 3. COPD/asthma 4. Hyperlipidemia 5. Hypokalemia-replete.  Active Problems:   Acute diastolic CHF (congestive heart failure), NYHA class 4   Severe aortic stenosis    Signed, Peter Martinique MD,FACC 11/29/2013 7:56 AM

## 2013-11-29 NOTE — Progress Notes (Signed)
Utilization review completed. Drew Herman, RN, BSN. 

## 2013-11-29 NOTE — Progress Notes (Signed)
Day of Surgery Procedure(s) (LRB): TRANSESOPHAGEAL ECHOCARDIOGRAM (TEE) (N/A) Subjective:  No complaints. Tolerated TEE this am without problems  Objective: Vital signs in last 24 hours: Temp:  [96.4 F (35.8 C)-98.1 F (36.7 C)] 97.1 F (36.2 C) (02/20 1153) Pulse Rate:  [66-100] 87 (02/20 1153) Cardiac Rhythm:  [-] Normal sinus rhythm (02/20 1105) Resp:  [15-33] 20 (02/20 1153) BP: (110-161)/(35-100) 144/64 mmHg (02/20 1153) SpO2:  [96 %-99 %] 99 % (02/20 1153) Weight:  [104 kg (229 lb 4.5 oz)-104.3 kg (229 lb 15 oz)] 104 kg (229 lb 4.5 oz) (02/20 0500)  Hemodynamic parameters for last 24 hours:    Intake/Output from previous day: 02/19 0701 - 02/20 0700 In: 240 [P.O.:240] Out: 1500 [Urine:1500] Intake/Output this shift: Total I/O In: 250 [P.O.:240; I.V.:10] Out: 1000 [Urine:1000]  General appearance: alert and cooperative Heart: regular rate and rhythm, systolic murmur over aorta. Lungs: clear to auscultation bilaterally Extremities: extremities normal, atraumatic, no cyanosis or edema  Lab Results:  Recent Labs  11/28/13 0735 11/28/13 1612  WBC 12.7* 10.6*  HGB 11.8* 11.2*  HCT 35.1* 33.5*  PLT 126* 132*   BMET:  Recent Labs  11/28/13 0735 11/28/13 1612 11/29/13 0129  NA 139  --  140  K 3.9  --  3.5*  CL 102  --  100  CO2 23  --  27  GLUCOSE 107*  --  98  BUN 18  --  15  CREATININE 0.95 0.93 0.95  CALCIUM 9.2  --  8.9    PT/INR:  Recent Labs  11/26/13 1927  LABPROT 14.2  INR 1.12   ABG    Component Value Date/Time   PHART 7.332* 11/27/2013 1109   HCO3 24.4* 11/27/2013 1114   TCO2 26 11/27/2013 1114   ACIDBASEDEF 2.0 11/27/2013 1114   O2SAT 59.0 11/27/2013 1114   CBG (last 3)  No results found for this basename: GLUCAP,  in the last 72 hours  Assessment/Plan:  TEE reviewed. She has severe AS and moderate-severe MS. She will require AVR and MVR. I will plan to do this on Tuesday, March 3rd. Hopefully she can go home this weekend and my  office will call her to schedule.  LOS: 1 day    Hadja Harral K 11/29/2013

## 2013-11-29 NOTE — CV Procedure (Signed)
   PROCEDURE NOTE:    Procedure:  Transesophageal echocardiogram Operator:  Fransico Him, MD Indications:  Severe AS and moderate MS by echo Complications:None IV Meds: Versed 4mg , Fentanyl 77mcg IV  Results: Normal LV size and function Normal RV size and function Normal TV with trivial TR Normal PV with trivial PR Heavily calcified trileaflet AV with severe leaflet restriction. There is severe AS with AVA by planimetry 0.5 to 0.71cm2. There is mild eccentric aortic insufficiency.  The posterior mitral valve leaflet his heavily calcified and fixed.  The anterior MV leaflet is thickened and calcified but to a lesser degree.  The mean MV gradient was 9-26mmHg. Consistent with severe MS.  The LA is  Severely dilated with some spontaneous echo contrast.  The LAA is normal with no evidence of thrombus.  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    The ascending and thoracic aorta are normal  The patient tolerated the procedure well and was transferred back to her room in stable condition.

## 2013-11-29 NOTE — Progress Notes (Signed)
  Echocardiogram Echocardiogram Transesophageal has been performed.  [name] 11/29/2013, 10:11 AM

## 2013-11-30 ENCOUNTER — Encounter: Payer: Self-pay | Admitting: Internal Medicine

## 2013-11-30 DIAGNOSIS — R799 Abnormal finding of blood chemistry, unspecified: Secondary | ICD-10-CM

## 2013-11-30 LAB — BASIC METABOLIC PANEL
BUN: 16 mg/dL (ref 6–23)
CO2: 29 mEq/L (ref 19–32)
Calcium: 8.9 mg/dL (ref 8.4–10.5)
Chloride: 97 mEq/L (ref 96–112)
Creatinine, Ser: 1.01 mg/dL (ref 0.50–1.10)
GFR calc Af Amer: 65 mL/min — ABNORMAL LOW (ref >90)
GFR calc non Af Amer: 56 mL/min — ABNORMAL LOW (ref >90)
Glucose, Bld: 99 mg/dL (ref 70–99)
Potassium: 3.6 mEq/L — ABNORMAL LOW (ref 3.7–5.3)
Sodium: 139 mEq/L (ref 137–147)

## 2013-11-30 MED ORDER — FUROSEMIDE 10 MG/ML IJ SOLN
40.0000 mg | Freq: Once | INTRAMUSCULAR | Status: AC
  Administered 2013-11-30: 40 mg via INTRAVENOUS
  Filled 2013-11-30: qty 4

## 2013-11-30 MED ORDER — POLYETHYLENE GLYCOL 3350 17 G PO PACK
17.0000 g | PACK | Freq: Every day | ORAL | Status: DC
  Administered 2013-11-30 – 2013-12-01 (×2): 17 g via ORAL
  Filled 2013-11-30 (×3): qty 1

## 2013-11-30 NOTE — Progress Notes (Signed)
Called by RN re: Miralax. Pt takes daily at home, not listed on home meds. Have ordered.

## 2013-11-30 NOTE — Progress Notes (Signed)
SUBJECTIVE:  Feels ok.  Not ready to go home yet since her husband as the flu  OBJECTIVE:   Vitals:   Filed Vitals:   11/29/13 1407 11/29/13 2041 11/30/13 0441 11/30/13 0922  BP: 146/57 141/59 116/66   Pulse: 83 87 70   Temp: 97.4 F (36.3 C) 97.1 F (36.2 C) 97.8 F (36.6 C)   TempSrc: Oral Oral Oral   Resp: 20 18 19    Height:      Weight:   225 lb (102.059 kg)   SpO2: 100% 97% 96% 94%   I&O's:   Intake/Output Summary (Last 24 hours) at 11/30/13 6213 Last data filed at 11/30/13 0300  Gross per 24 hour  Intake    720 ml  Output   3500 ml  Net  -2780 ml   TELEMETRY: Reviewed telemetry pt in NSR:     PHYSICAL EXAM General: Well developed, well nourished, in no acute distress Head: Eyes PERRLA, No xanthomas.   Normal cephalic and atramatic  Lungs:   Crackles at bases bilaterally Heart:   HRRR S1 S2 Pulses are 2+ & equal. Abdomen: Bowel sounds are positive, abdomen soft and non-tender without masses Extremities:   No clubbing, cyanosis or edema.  DP +1 Neuro: Alert and oriented X 3. Psych:  Good affect, responds appropriately   LABS: Basic Metabolic Panel:  Recent Labs  11/29/13 0129 11/30/13 0350  NA 140 139  K 3.5* 3.6*  CL 100 97  CO2 27 29  GLUCOSE 98 99  BUN 15 16  CREATININE 0.95 1.01  CALCIUM 8.9 8.9   Liver Function Tests: No results found for this basename: AST, ALT, ALKPHOS, BILITOT, PROT, ALBUMIN,  in the last 72 hours No results found for this basename: LIPASE, AMYLASE,  in the last 72 hours CBC:  Recent Labs  11/28/13 0735 11/28/13 1612  WBC 12.7* 10.6*  HGB 11.8* 11.2*  HCT 35.1* 33.5*  MCV 94.4 94.1  PLT 126* 132*   Cardiac Enzymes:  Recent Labs  11/28/13 1223 11/28/13 1834 11/29/13 0129  TROPONINI 0.57* 0.76* 0.78*   BNP: No components found with this basename: POCBNP,  D-Dimer: No results found for this basename: DDIMER,  in the last 72 hours Hemoglobin A1C: No results found for this basename: HGBA1C,  in the last  72 hours Fasting Lipid Panel: No results found for this basename: CHOL, HDL, LDLCALC, TRIG, CHOLHDL, LDLDIRECT,  in the last 72 hours Thyroid Function Tests: No results found for this basename: TSH, T4TOTAL, FREET3, T3FREE, THYROIDAB,  in the last 72 hours Anemia Panel: No results found for this basename: VITAMINB12, FOLATE, FERRITIN, TIBC, IRON, RETICCTPCT,  in the last 72 hours Coag Panel:   Lab Results  Component Value Date   INR 1.12 11/26/2013   INR 1.17 08/18/2011    RADIOLOGY: Dg Chest 2 View  11/19/2013   CLINICAL DATA:  Followup pneumonia, shortness of breath, history COPD, hypertension, asthma  EXAM: CHEST  2 VIEW  COMPARISON:  10/22/2013  FINDINGS: Enlargement of cardiac silhouette.  Mediastinal contours and pulmonary vascularity normal.  Minimal right apical pleural thickening stable.  Emphysematous and bronchitic changes consistent with COPD.  Mild residual right basilar infiltrate.  Minimal scarring at left base.  Remaining lungs clear.  No pneumothorax.  Question minimal right pleural effusion.  Mitral annular calcification again noted.  IMPRESSION: COPD changes with mild residual infiltrate at right base and question minimal right pleural effusion.   Electronically Signed   By: Crist Infante.D.  On: 11/19/2013 16:07   Dg Chest Port 1 View  11/28/2013   CLINICAL DATA:  Shortness of breath.  EXAM: PORTABLE CHEST - 1 VIEW  COMPARISON:  PA and lateral chest 11/19/2013 and 10/22/2013.  FINDINGS: Small right effusion and basilar airspace disease are again seen. The left lung is clear. Heart size is upper normal. No pneumothorax.  IMPRESSION: No marked change in a small right effusion and basilar airspace disease.   Electronically Signed   By: Inge Rise [name]   On: 11/28/2013 07:31   ASSESSMENT AND PLAN:  1. Acute diastolic CHF related to severe valvular heart disease. Exacerbated by recent cath. Improved with diuresis. Patient seen by Dr. Cyndia Bent. His schedule is full next week.  Plan is to potentially DC later this weekend to return as outpatient for surgery. She still has some CHF on lung exam so will continue with IV Lasix for now. 2. NSTEMI. Normal coronaries on cath. Due to demand from CHF.  3. COPD/asthma  4. Hyperlipidemia  5. Hypokalemia-replete.  6.  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

## 2013-11-30 NOTE — Progress Notes (Signed)
SATURATION QUALIFICATIONS: (This note is used to comply with regulatory documentation for home oxygen)  Patient Saturations on Room Air at Rest = 93 %  Patient Saturations on Room Air while Ambulating = 89-90%    Please briefly explain why patient needs home oxygen: Patient did not desaturate below 89% while walking on room air.

## 2013-11-30 NOTE — Assessment & Plan Note (Signed)
Glad you are doing better but need to keep close eye to ensure you are out of woods Do CXR 2 view in 2 weeks - for followup pneumonia and effusion REgarding hoarse voice: call office [phone] in 24h-48h to report progress  FU Tammy Parrett after 2 weeks with CXR

## 2013-11-30 NOTE — Progress Notes (Signed)
CARDIAC REHAB PHASE I   PRE:  Rate/Rhythm: *49 SR  BP:  Supine:   Sitting: 120/64  Standing:   SaO2: 95% on 2L  MODE:  Ambulation: 350 ft   POST:  Rate/Rhythm: 112 ST  BP:  Supine:   Sitting: 132/70  Standing:    SaO2: 89-90% on RA Tolerated ambulation well independently and walked on RA, she did not desaturated below 89%, encouraged purse lip breathing.  Completed CHF education, daily weights, when to call the MD, importance of IS and deep breathing post AVR in about 10 days.  Discussed activity progression upon discharge.  For possible discharge tomorrow.  Patient set up with pre-OHS video to watch. [phone] Liliane Channel RN, BSN 11/30/2013 11:12 AM

## 2013-12-01 ENCOUNTER — Inpatient Hospital Stay (HOSPITAL_COMMUNITY): Payer: Medicare Other

## 2013-12-01 DIAGNOSIS — J441 Chronic obstructive pulmonary disease with (acute) exacerbation: Secondary | ICD-10-CM

## 2013-12-01 DIAGNOSIS — J9620 Acute and chronic respiratory failure, unspecified whether with hypoxia or hypercapnia: Secondary | ICD-10-CM

## 2013-12-01 DIAGNOSIS — I5031 Acute diastolic (congestive) heart failure: Principal | ICD-10-CM

## 2013-12-01 DIAGNOSIS — I359 Nonrheumatic aortic valve disorder, unspecified: Secondary | ICD-10-CM

## 2013-12-01 LAB — PRO B NATRIURETIC PEPTIDE: Pro B Natriuretic peptide (BNP): 1068 pg/mL — ABNORMAL HIGH (ref 0–125)

## 2013-12-01 LAB — BASIC METABOLIC PANEL
BUN: 19 mg/dL (ref 6–23)
CO2: 33 mEq/L — ABNORMAL HIGH (ref 19–32)
Calcium: 9.4 mg/dL (ref 8.4–10.5)
Chloride: 94 mEq/L — ABNORMAL LOW (ref 96–112)
Creatinine, Ser: 1.11 mg/dL — ABNORMAL HIGH (ref 0.50–1.10)
GFR calc Af Amer: 58 mL/min — ABNORMAL LOW (ref >90)
GFR calc non Af Amer: 50 mL/min — ABNORMAL LOW (ref >90)
Glucose, Bld: 102 mg/dL — ABNORMAL HIGH (ref 70–99)
Potassium: 4 mEq/L (ref 3.7–5.3)
Sodium: 137 mEq/L (ref 137–147)

## 2013-12-01 MED ORDER — METHYLPREDNISOLONE SODIUM SUCC 40 MG IJ SOLR
40.0000 mg | Freq: Two times a day (BID) | INTRAMUSCULAR | Status: DC
  Administered 2013-12-01 – 2013-12-02 (×2): 40 mg via INTRAVENOUS
  Filled 2013-12-01 (×4): qty 1

## 2013-12-01 MED ORDER — LEVOFLOXACIN 500 MG PO TABS
500.0000 mg | ORAL_TABLET | Freq: Every day | ORAL | Status: DC
  Administered 2013-12-01 – 2013-12-02 (×2): 500 mg via ORAL
  Filled 2013-12-01 (×2): qty 1

## 2013-12-01 MED ORDER — LEVALBUTEROL HCL 0.63 MG/3ML IN NEBU
0.6300 mg | INHALATION_SOLUTION | Freq: Four times a day (QID) | RESPIRATORY_TRACT | Status: DC
  Administered 2013-12-01: 0.63 mg via RESPIRATORY_TRACT
  Filled 2013-12-01 (×5): qty 3

## 2013-12-01 NOTE — Progress Notes (Signed)
Chest xray reviewed.  She has a moderate pleural effusion but no frank pulmonary edema.  Her BNP is trending downward.  I'm not sure to what degree her asthma is but she has signficant wheezing on exam so I will get Pulmonary to see her so that we can make sure her Pulmonary function is maximized prior to surgery.

## 2013-12-01 NOTE — Progress Notes (Addendum)
SUBJECTIVE:  Complains of being very tired  OBJECTIVE:   Vitals:   Filed Vitals:   11/30/13 1450 11/30/13 2003 11/30/13 2030 12/01/13 0329  BP: 108/67 119/72  119/61  Pulse: 70 82 87 77  Temp: 98 F (36.7 C) 98.3 F (36.8 C)  97.4 F (36.3 C)  TempSrc: Oral Oral  Oral  Resp: 18 20 18 20   Height:      Weight:    223 lb 1.6 oz (101.197 kg)  SpO2: 91% 90% 88% 97%   I&O's:   Intake/Output Summary (Last 24 hours) at 12/01/13 9892 Last data filed at 12/01/13 0747  Gross per 24 hour  Intake    960 ml  Output   2875 ml  Net  -1915 ml   TELEMETRY: Reviewed telemetry pt in NSR:     PHYSICAL EXAM General: Well developed, well nourished, in no acute distress Head: Eyes PERRLA, No xanthomas.   Normal cephalic and atramatic  Lungs:   Diffuse wheezing throughout with crackles about 1/3 way up bilaterally Heart:   HRRR S1 S2 Pulses are 2+ & equal. Abdomen: Bowel sounds are positive, abdomen soft and non-tender without masses Extremities:   No clubbing, cyanosis or edema.  DP +1 Neuro: Alert and oriented X 3. Psych:  Good affect, responds appropriately   LABS: Basic Metabolic Panel:  Recent Labs  11/30/13 0350 12/01/13 0625  NA 139 137  K 3.6* 4.0  CL 97 94*  CO2 29 33*  GLUCOSE 99 102*  BUN 16 19  CREATININE 1.01 1.11*  CALCIUM 8.9 9.4   Liver Function Tests: No results found for this basename: AST, ALT, ALKPHOS, BILITOT, PROT, ALBUMIN,  in the last 72 hours No results found for this basename: LIPASE, AMYLASE,  in the last 72 hours CBC:  Recent Labs  11/28/13 1612  WBC 10.6*  HGB 11.2*  HCT 33.5*  MCV 94.1  PLT 132*   Cardiac Enzymes:  Recent Labs  11/28/13 1223 11/28/13 1834 11/29/13 0129  TROPONINI 0.57* 0.76* 0.78*   BNP: No components found with this basename: POCBNP,  D-Dimer: No results found for this basename: DDIMER,  in the last 72 hours Hemoglobin A1C: No results found for this basename: HGBA1C,  in the last 72 hours Fasting Lipid  Panel: No results found for this basename: CHOL, HDL, LDLCALC, TRIG, CHOLHDL, LDLDIRECT,  in the last 72 hours Thyroid Function Tests: No results found for this basename: TSH, T4TOTAL, FREET3, T3FREE, THYROIDAB,  in the last 72 hours Anemia Panel: No results found for this basename: VITAMINB12, FOLATE, FERRITIN, TIBC, IRON, RETICCTPCT,  in the last 72 hours Coag Panel:   Lab Results  Component Value Date   INR 1.12 11/26/2013   INR 1.17 08/18/2011    RADIOLOGY: Dg Chest 2 View  11/19/2013   CLINICAL DATA:  Followup pneumonia, shortness of breath, history COPD, hypertension, asthma  EXAM: CHEST  2 VIEW  COMPARISON:  10/22/2013  FINDINGS: Enlargement of cardiac silhouette.  Mediastinal contours and pulmonary vascularity normal.  Minimal right apical pleural thickening stable.  Emphysematous and bronchitic changes consistent with COPD.  Mild residual right basilar infiltrate.  Minimal scarring at left base.  Remaining lungs clear.  No pneumothorax.  Question minimal right pleural effusion.  Mitral annular calcification again noted.  IMPRESSION: COPD changes with mild residual infiltrate at right base and question minimal right pleural effusion.   Electronically Signed   By: Lavonia Dana [name]   On: 11/19/2013 16:07   Dg Chest [facility]  1 View  11/28/2013   CLINICAL DATA:  Shortness of breath.  EXAM: PORTABLE CHEST - 1 VIEW  COMPARISON:  PA and lateral chest 11/19/2013 and 10/22/2013.  FINDINGS: Small right effusion and basilar airspace disease are again seen. The left lung is clear. Heart size is upper normal. No pneumothorax.  IMPRESSION: No marked change in a small right effusion and basilar airspace disease.   Electronically Signed   By: Inge Rise [name]   On: 11/28/2013 07:31   ASSESSMENT AND PLAN:  1. Acute diastolic CHF related to severe valvular heart disease. Exacerbated by recent cath. Improved with diuresis. Patient seen by Dr. Cyndia Bent. His schedule is full next week. Plan was to potentially DC  later this weekend to return as outpatient for surgery on March 3rd but she still has CHF on lung exam and now has wheezing diffusely that is worse from yesterday. She is 4 lbs down from admit. She put out 2L yesterday and is 6L negative from admit. Check chest xray and repeat BNP to determine how much of this is CHF and how much is her asthma. If BNP elevated will give another dose of IV Lasix 2. NSTEMI. Normal coronaries on cath. Due to demand from CHF.  3. COPD/asthma  4. Hyperlipidemia  5. Hypokalemia-replete.  6. 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

## 2013-12-01 NOTE — Consult Note (Signed)
PULMONARY CONSULT NOTE   Patient name: [name] Medical record number: [id number] Date of birth: 03-28-1946 Age: 68 y.o. Gender: female PCP: Adella Hare, MD  Date: 12/01/2013 Reason for Consult: Pulm assessment Referring Physician:  Cards  Brief history: 68 y/o F, followed for pulm by MR & last seen 11/19/13, w/ Hx COPD/Asthma, ex-smoker w/ 15 pack-yr hx & quit 1970s, recent pneumococcal pneumonia (adm 1/1 - 10/15/13 by BQ), obesity, mild OSA not on CPAP.  She has rheumatic valvular heart disease w/ severe AS, mod MR, diastolic CHF and recent cath w/ clean coronaries & DrBartle is planning AVR/MVR surgery soon.  Re-admitted w/ dyspnea & hypoxemia, sl improved w/ diuresis and pulmonary asked to assist w/ assessment & treatment in advance of the planned surgery...   Lines/tubes  Culture data/sepsis markers  Antibiotics  Best practice  Protocols/consults  Events/studies  HPI:   68 y/o F, followed for pulm by MR & last seen 11/19/13, w/ Hx COPD/Asthma, ex-smoker w/ 15 pack-yr hx & quit 1970s, recent pneumococcal pneumonia (adm 1/1 - 10/15/13 by BQ), obesity, mild OSA not on CPAP.  She has rheumatic valvular heart disease w/ severe AS, mod MR, diastolic CHF and recent cath w/ clean coronaries & DrBartle is planning AVR/MVR surgery soon.  Re-admitted w/ dyspnea & hypoxemia, sl improved w/ diuresis and pulmonary asked to assist w/ assessment & treatment in advance of the planned surgery...   Note additional hx of colon cancer after presenting in 2012 w/ severe anemia and underwent laparoscopic partial colectomy for a T3 N0 M0 rectal cancer on 08/25/2011. 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 She was treated with chemotherapy and radiation therapy.   Re-adm w/ incr SOB, min cough & chest congestion; denies fever, chills or sput production; she notes the [facility] considered treating her for COPD exac 2/10 but held off, then called in Levaquin &  Medrol 2/20 but she never filled it & was readmitted...   CXR 2/22>  Mod cardiomegaly, mod right effusion, & incr interstitial markings bilat (RLL pneumonia seen 1/1 resolved & back to baseline by the 1/13 f/u film)...  PFTs 2/20>  FVC=1.49 (42%), FEV1=0.93 (34%), %1sec=63, mid-flows=20%predicted;  After bronchodil FEV1=1.05 (+12%)...   Oxygenation:  Not on home O2, she was hypoxemic on adm.  Ambulated on RA this adm> Sat on RA at Rest= 93%;  Sat on RA while Ambulating= 89-90%   She did pulm rehab in 2014  Home Meds:  Breo, Spiriva daily, Singulair10, ProairHFA prn...   Past Medical History  Diagnosis Date  . IBS (irritable bowel syndrome)   . Hyperlipidemia   . Allergic rhinitis   . History of pneumonia   . COPD (chronic obstructive pulmonary disease)   . Asthma   . Heart murmur     Aortic  . Lung nodule     RLL nodule-33mm stable 2006, April 2009, and June 2009  . Candidiasis of breast   . Anemia   . Hyperlipemia   . Obesity   . Unspecified essential hypertension   . Heart valve problem     PER CARDIOLOGIST 05-03-2011  . Sleep difficulties   . Hemorrhoids   . Sleep apnea     PT TOLD BORDERLINE-TRIED CPAP-DID NOT HELP-SHE DOES NOT USE CPAP  . Arthritis   . 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 History of radiation therapy 05/30/11 to 07/07/11    rectum  . Prophylactic chemotherapy     Will finish chemo on  03/04/12  . Pneumonia     hx of several times   . Shingles   . rectal ca 04/2011  . Severe aortic valve stenosis 10/01/2013  . Shortness of breath   . Rheumatic heart disease    Past Surgical History  Procedure Laterality Date  . Foot surgery  left foot    hammer toe  . Hand surgery  Bil    carpal tunnel  . Back surgery      2006 SPACER---2007 LUMBAR FUSION  . Tonsillectomy    . Colon resection  08/25/2011    Procedure: COLON RESECTION LAPAROSCOPIC;  Surgeon: Stark Klein, MD;  Location: [facility];  Service: General;  Laterality: N/A;  Laparoscopic Assisted Low Anterior  Resection Diverting Ostomy and onQ pain pump  . Ileostomy  08/25/2011  . Colon surgery    . Ileostomy closure  04/02/2012    Procedure: ILEOSTOMY TAKEDOWN;  Surgeon: Stark Klein, MD;  Location: [facility];  Service: General;  Laterality: N/A;  . Bowel resection  04/02/2012    Procedure: SMALL BOWEL RESECTION;  Surgeon: Stark Klein, MD;  Location: [facility];  Service: General;  Laterality: N/A;  . Carpal tunnel release    . Cardiac catheterization  11/27/2013   Family History  Problem Relation Age of Onset  . Lung cancer Father   . Cancer Father     lung  . Diabetes    . Heart disease    . Heart disease Mother   . Diabetes Mother   . Hypertension Mother   . Colon cancer Neg Hx   . Esophageal cancer Neg Hx   . Stomach cancer Neg Hx   . Rectal cancer Neg Hx    Social History:  reports that she quit smoking about 38 years ago. Her smoking use included Cigarettes. She has a 15 pack-year smoking history. She has never used smokeless tobacco. She reports that she does not drink alcohol or use illicit drugs.  Allergies:  Allergies  Allergen Reactions  . Codeine Nausea Only  . Prednisone Hives    In different places all over the body.  . Sulfonamide Derivatives Nausea Only   Medications:  Prior to Admission medications   Medication Sig Start Date End Date Taking? Authorizing Provider  albuterol (PROVENTIL HFA;VENTOLIN HFA) 108 (90 BASE) MCG/ACT inhaler Inhale 2 puffs into the lungs every 6 (six) hours as needed for wheezing.   Yes Historical Provider, MD  aspirin 81 MG tablet Take 81 mg by mouth at bedtime.    Yes Historical Provider, MD  Calcium Carbonate-Vitamin D (CALCIUM 600 + D PO) Take 1,200 mg by mouth at bedtime.    Yes Historical Provider, MD  diltiazem (CARDIZEM CD) 120 MG 24 hr capsule Take 120 mg by mouth at bedtime.    Yes Historical Provider, MD  Fluticasone Furoate-Vilanterol (BREO ELLIPTA) 100-25 MCG/INH AEPB Inhale 1 puff into the lungs daily. 07/15/13  Yes Brand Males,  MD  lovastatin (MEVACOR) 40 MG tablet Take 40 mg by mouth at bedtime.   Yes Historical Provider, MD  montelukast (SINGULAIR) 10 MG tablet Take 10 mg by mouth at bedtime.   Yes Historical Provider, MD  pantoprazole (PROTONIX) 40 MG tablet Take 40 mg by mouth at bedtime.  05/03/11  Yes Historical Provider, MD  tiotropium (SPIRIVA) 18 MCG inhalation capsule Place 18 mcg into inhaler and inhale daily.   Yes Historical Provider, MD   ROS: Constitutional:  Denies F/C/S, anorexia, unexpected weight change. HEENT:  No HA, visual changes, earache,  nasal symptoms, +sl sore throat & hoarseness. Resp:  min cough, no sputum/ hemoptysis; sl SOB, w/o tightness/ wheezing. Cardio:  No CP, palpit, +DOE, tr edema. GI:  Reports recent N/V/D, no blood seen; no reflux, abd pain, distention, or gas. GU:  No dysuria, freq, urgency, hematuria, or flank pain. MS:  Denies joint pain, swelling, tenderness, or decr ROM; no neck pain, back pain, etc. Neuro:  No tremors, seizures, dizziness, syncope, weakness, numbness, gait abn. Skin:  No suspicious lesions or skin rash. Heme:  No adenopathy, bruising, bleeding. Psyche: Denies confusion, sleep disturbance, hallucinations, anxiety, depression.   Physical Exam:    Temp:  [97.4 F (36.3 C)-98.3 F (36.8 C)] 98.3 F (36.8 C) (02/22 1412) Pulse Rate:  [77-87] 80 (02/22 1412) Resp:  [18-20] 18 (02/22 1412) BP: (114-119)/(50-72) 114/50 mmHg (02/22 1412) SpO2:  [88 %-97 %] 92 % (02/22 1412) Weight:  [101.197 kg (223 lb 1.6 oz)] 101.197 kg (223 lb 1.6 oz) (02/22 0329)    Intake/Output Summary (Last 24 hours) at 12/01/13 1625 Last data filed at 12/01/13 1316  Gross per 24 hour  Intake    720 ml  Output   1775 ml  Net  -1055 ml    Vital Signs:  Reviewed...  General:  WD, overweight, 68 y/o WF in NAD; alert & oriented; pleasant & cooperative... HEENT:  [place]/AT; Conjunctiva- pink, Sclera- nonicteric, EOM-wnl, PERRLA, EACs-clear, TMs-wnl; NOSE-clear; THROAT-clear &  wnl. Neck:  Supple w/ fair ROM; no JVD; normal carotid impulses w/ transmit murmur; no thyromegaly or nodules palpated; no lymphadenopathy. Chest:  decr BS at bases, scat rales/ rhonchi & wheezing R>L w/o consolidation.Marland Kitchen Heart:  Regular Rhythm; gr3/6 AS murmur w/o rubs, +S4 gallop Abdomen:  Soft & nontender- no guarding or rebound; normal bowel sounds; no organomegaly or masses palpated. Ext:  Normal ROM; without deformities, mild arthritic changes; no varicose veins, +venous insuffic, tr edema. Neuro:  CNs II-XII intact; motor testing normal; sensory testing normal; gait normal & balance OK. Derm:  No lesions noted; no rash etc. Lymph:  No cervical, supraclavicular, axillary, or inguinal adenopathy palpated.   LAB RESULT Lab Results  Component Value Date   CREATININE 1.11* 12/01/2013   BUN 19 12/01/2013   NA 137 12/01/2013   K 4.0 12/01/2013   CL 94* 12/01/2013   CO2 33* 12/01/2013   Lab Results  Component Value Date   WBC 10.6* 11/28/2013   HGB 11.2* 11/28/2013   HCT 33.5* 11/28/2013   MCV 94.1 11/28/2013   PLT 132* 11/28/2013   Lab Results  Component Value Date   ALT 9 11/25/2013   AST 16 11/25/2013   ALKPHOS 99 11/25/2013   BILITOT 0.6 11/25/2013   Lab Results  Component Value Date   INR 1.12 11/26/2013   INR 1.17 08/18/2011   NOTE:  BNP was 8000 in January, improved to 3200, & now 1068...              Troponins are all elev ~0.75   IMAGING:  CXR 2/22> IMPRESSION:  1. Cardiac enlargement.  2. Persistent right pleural effusion.   Assessment and Plan   1)  COPD w/ asthmatic component & mild exacerbation Recent pneumococcal pneumonia- resolved Hx pulmonary nodule- 23mm RLL, no change serially Plan: - Let's go ahead and treat as planned by [facility] per recent outpt eval:  Start empiric Levaquin and Medrol Rx (she claims allergy to prednisone). - continue home therapy w/ Breo (hosp substitutes Dulera), Spiriva, Singulair. - add NEBS w/ Xopenex  2)  Rheumatic valvular  heart disease w/ severe AS, mod MR=> DrBartle plans surg soon. Acute on chr diastolic CHF Plan: - diuresis per Cards  3) Elev troponins, ?NSTEMI, likely due to demans ischemia inlight of norm coronaries on cath - continue ASA, diltiazem per Cards  4) Other problems as noted...    NADEL,SCOTT M 12/01/2013, 4:25 PM

## 2013-12-02 ENCOUNTER — Other Ambulatory Visit: Payer: Self-pay | Admitting: *Deleted

## 2013-12-02 ENCOUNTER — Encounter (HOSPITAL_COMMUNITY): Payer: Self-pay | Admitting: Cardiology

## 2013-12-02 DIAGNOSIS — Z01810 Encounter for preprocedural cardiovascular examination: Secondary | ICD-10-CM

## 2013-12-02 DIAGNOSIS — I059 Rheumatic mitral valve disease, unspecified: Secondary | ICD-10-CM

## 2013-12-02 DIAGNOSIS — I359 Nonrheumatic aortic valve disorder, unspecified: Secondary | ICD-10-CM

## 2013-12-02 MED ORDER — POTASSIUM CHLORIDE CRYS ER 20 MEQ PO TBCR: 20.0000 meq | EXTENDED_RELEASE_TABLET | Freq: Every day | ORAL | Status: DC

## 2013-12-02 MED ORDER — LEVOFLOXACIN 500 MG PO TABS: 500.0000 mg | ORAL_TABLET | Freq: Every day | ORAL | Status: DC

## 2013-12-02 MED ORDER — LEVALBUTEROL HCL 0.63 MG/3ML IN NEBU: 0.6300 mg | INHALATION_SOLUTION | Freq: Four times a day (QID) | RESPIRATORY_TRACT | Status: DC

## 2013-12-02 MED ORDER — LEVALBUTEROL HCL 0.63 MG/3ML IN NEBU
0.6300 mg | INHALATION_SOLUTION | Freq: Four times a day (QID) | RESPIRATORY_TRACT | Status: DC
  Administered 2013-12-02: 0.63 mg via RESPIRATORY_TRACT
  Filled 2013-12-02 (×2): qty 3

## 2013-12-02 MED ORDER — POLYETHYLENE GLYCOL 3350 17 G PO PACK: 17.0000 g | PACK | Freq: Every day | ORAL | Status: DC

## 2013-12-02 MED ORDER — METHYLPREDNISOLONE (PAK) 4 MG PO TABS: ORAL_TABLET | ORAL | Status: DC

## 2013-12-02 MED ORDER — FUROSEMIDE 40 MG PO TABS: 40.0000 mg | ORAL_TABLET | Freq: Every day | ORAL | Status: DC

## 2013-12-02 NOTE — Progress Notes (Signed)
This patient is stable and on diuretic regimen with surgery planned for 8 days hence. I have reconciled meds. Needs BMET in 5 days.

## 2013-12-02 NOTE — Progress Notes (Signed)
CARDIAC REHAB PHASE I   PRE:  Rate/Rhythm: 93 SR  BP:  Supine:   Sitting:   Standing: 110/60   SaO2: 95 RA  MODE:  Ambulation: 510 ft   POST:  Rate/Rhythm: 113  BP:  Supine:   Sitting: 146/70  Standing:    SaO2: 95 RA 0900-0930 Pt tolerated ambulation well. She has some DOE. RA sat after walk 95%. Pt able to walk 510 feet. Pt back to recliner after walk with call light in reach. Pt has watched pre heart surgery video and denies any questions. She has been using IS able to get it to 1250. I encouraged her to continue to use it at home.  Rodney Langton RN 12/02/2013 9:30 AM

## 2013-12-02 NOTE — Progress Notes (Addendum)
PULMONARY CONSULT NOTE   Patient name: [name] Medical record number: [id number] Date of birth: [birth year] Age: 68 y.o. Gender: female PCP: Adella Hare, MD  Date: 12/02/2013 Reason for Consult: Pulm assessment Referring Physician:  Cards  Brief history: 68 y/o F, followed for pulm by MR & last seen 11/19/13, w/ Hx COPD/Asthma, ex-smoker w/ 15 pack-yr hx & quit 1970s, recent pneumococcal pneumonia (adm 1/1 - 10/15/13 by BQ), obesity, mild OSA not on CPAP.  She has rheumatic valvular heart disease w/ severe AS, mod MR, diastolic CHF and recent cath w/ clean coronaries & DrBartle is planning AVR/MVR surgery soon.  Re-admitted w/ dyspnea & hypoxemia, sl improved w/ diuresis and pulmonary asked to assist w/ assessment & treatment in advance of the planned surgery...   Lines/tubes PIV  Culture data/sepsis markers MRSA 2/19 >>> negative  Antibiotics Levaquin 2/22 >>>  SUBJECTIVE:  Pt reports that breathing is improved from yesterday.  Ambulated in the halls with therapy, had mild SOB with exertion, relieved after resting. SpO2 during ambulation remained high 90's per pt.   Mild wheezing but improved from yesterday.  Otherwise no acute events.  Vitals stable  Physical Exam:    Temp:  [97.3 F (36.3 C)-98.3 F (36.8 C)] 97.3 F (36.3 C) (02/23 0355) Pulse Rate:  [79-86] 86 (02/23 0940) Resp:  [18-20] 20 (02/23 0355) BP: (114-122)/(50-72) 118/68 mmHg (02/23 0940) SpO2:  [88 %-96 %] 94 % (02/23 0940) Weight:  [221 lb (100.245 kg)] 221 lb (100.245 kg) (02/23 0355)    Intake/Output Summary (Last 24 hours) at 12/02/13 1118 Last data filed at 12/02/13 0836  Gross per 24 hour  Intake    720 ml  Output   2050 ml  Net  -1330 ml     General:  Pleasant female, obese, in NAD. HEENT:  [place]/AT, PERRL. Neck:  No JVD, supple. Chest:  Decreased BS at bases, minimal scattered rales.  Diffuse expiratory wheezes. Heart:  RRR, 3/6 SEM radiating into carotids.  No rubs/gallops. Abdomen:  BS x  4, Soft & nontender. Ext:  Normal ROM; without gross deformities. Neuro:  A&O X 3, non-focal. Derm:  No lesions noted; no rash etc.   LAB RESULT Lab Results  Component Value Date   CREATININE 1.11* 12/01/2013   BUN 19 12/01/2013   NA 137 12/01/2013   K 4.0 12/01/2013   CL 94* 12/01/2013   CO2 33* 12/01/2013   Lab Results  Component Value Date   WBC 10.6* 11/28/2013   HGB 11.2* 11/28/2013   HCT 33.5* 11/28/2013   MCV 94.1 11/28/2013   PLT 132* 11/28/2013   Lab Results  Component Value Date   ALT 9 11/25/2013   AST 16 11/25/2013   ALKPHOS 99 11/25/2013   BILITOT 0.6 11/25/2013   Lab Results  Component Value Date   INR 1.12 11/26/2013   INR 1.17 08/18/2011   NOTE:  BNP was 8000 in January, improved to 3200, & now 1068...              Troponins are all elev ~0.75   IMAGING:  CXR 2/22> IMPRESSION:  1. Cardiac enlargement.  2. Persistent right pleural effusion.   Assessment and Plan   1)  COPD w/ asthmatic component & mild exacerbation Recent pneumococcal pneumonia- resolved Hx pulmonary nodule- 24mm RLL, no change serially Right pleural effusion. Plan: - Continue Levaquin and Medrol Rx (pt claims allergy to prednisone). - Continue Dulera, Spiriva, Singulair. (Pt on Breo at home, hospital formulary subs [facility]  for [facility]). - Continue NEBS w/ Xopenex. - Incentive Spirometry.  2)  Rheumatic valvular heart disease w/ severe AS, mod MR=> Dr. Cyndia Bent plans surgical repair soon. Acute on chr diastolic CHF Plan: - Diuresis/manage per Cards.  3) Elev troponins, ?NSTEMI, likely due to demand ischemia inlight of normal coronaries on cath Plan: - continue ASA, diltiazem per Cards  4) Other problems as noted per primary team.   Montey Hora, PA - [facility] Pgr: [phone]  or [phone]

## 2013-12-02 NOTE — Discharge Summary (Signed)
Physician Discharge Summary     Patient ID: [name] MRN: [id number] DOB/AGE: 69/16/1947 69 y.o.  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 Normal coronaries on cath. Due to demand from CHF.    COPD/asthma    Hyperlipidemia    Hypokalemia.    Discharged Condition: stable  Hospital Course:   This is a 68 year old woman with rheumatic heart disease presenting with acute shortness of breath. The patient has been noted to have severe aortic stenosis and moderate mitral stenosis. She underwent diagnostic right and left heart catheterization yesterday as part of her preoperative workup. She was recovering from the procedure well until about 10:00 last night when she developed acute shortness of breath. She used her rescue inhaler without much improvement. Her breathing worsened overnight and she noted that her oxygen saturation was in the range of 75-80% early this morning. She continued to use her rescue inhaler with only a little bit of improvement. She had no chest pain or pressure. She denies lightheadedness, presyncope, edema, or abdominal swelling. She did have associated orthopnea and PND.   Notable findings in the emergency department include the following:  Chest x-ray showed a small right pleural effusion and basilar airspace disease. No significant change from previous study  Troponin is 0.5  BNP 3226  White blood cell count 12.7 The patient remained dyspneic at rest. She denies fevers, chills, or cough. She has no abdominal pain. She has no pain at her cath site. She has no other complaints at present.    She was admitted and started on 20 mg of IV lasix.  She diuresed 7.4 Liters.  BNP decreased considerably. The  small right pleural effusion persists but she felt much better.   TEE was completed the following day and revealed an AVA of 0.5 to 0.71cm2, posterior mitral valve leaflet his heavily calcified and fixed(see full report below).  Dr. Cyndia Bent was consult for AVR.  She was tentatively schedule for AVR and MVR with tissue values next week given her age of 68 and history of rectal cancer, IBS, COPD.  Potassium was replaced.  She was seen by PCCM.  Steroids were administered and scheduled to be tapered after discharge.  The patient was seen by Dr. Tamala Julian who felt she was stable for DC home.   Consults: PCCM  Significant Diagnostic Studies: TEE, 2/20/215 Normal LV size and function  Normal RV size and function  Normal TV with trivial TR  Normal PV with trivial PR  Heavily calcified trileaflet AV with severe leaflet restriction. There is severe AS with AVA by planimetry  There is mild eccentric aortic insufficiency.  The posterior mitral valve leaflet his heavily calcified and fixed. The anterior MV leaflet is thickened and calcified but to a lesser degree. The mean MV gradient was 9-77mmHg. Consistent with severe MS.  The LA is Severely dilated with some spontaneous echo contrast. The LAA is normal with no evidence of thrombus.  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  The ascending and thoracic aorta are normal  BNP (last 3 results)  Recent Labs  10/12/13 0230 11/28/13 0735 12/01/13 0625  PROBNP 8290.0*  3226.0* 1068.0*    Treatments: See above  Discharge Exam: Blood pressure 117/53, pulse 95, temperature 98.1 F (36.7 C), temperature source Oral, resp. rate 18, height 5\' 8"  (1.727 m), weight 221 lb (100.245 kg), SpO2 96.00%.   Disposition: 01-Home or Self Care      Discharge Orders   Future Appointments Provider Department Dept Phone   12/05/2013 10:15 AM Melvenia Needles,  NP [facility] [phone]   12/06/2013 2:00 PM [facility] [place] [phone]   12/17/2013 9:15 AM Sanda Klein, MD [facility] [phone]   12/26/2013 3:15 PM [facility] [phone]   12/26/2013 3:45 PM Ladell Pier, MD [facility] [phone]   Future Orders Complete By Expires   Diet - low sodium heart healthy  As directed    Discharge instructions  As directed    Comments:     1.  Weigh yourself daily.  If you gain 2 pounds in 24 hours of 5 pounds in a week, call our office for instructions.  Alternatively, if your weight continues to decrease by the same amounts, call the office.   2.  I have ordered a lab to check kidney function and K+.  Go to the lab on Friday to do this.   Increase activity slowly  As directed        Medication List         albuterol 108 (90 BASE) MCG/ACT inhaler  Commonly known as:  PROVENTIL HFA;VENTOLIN HFA  Inhale 2 puffs into the lungs every 6 (six) hours as needed for wheezing.     aspirin 81 MG tablet  Take 81 mg by mouth at bedtime.     CALCIUM 600 + D PO  Take 1,200 mg by mouth at bedtime.     diltiazem 120 MG 24 hr capsule  Commonly known as:  CARDIZEM CD  Take 120 mg by mouth at bedtime.     Fluticasone Furoate-Vilanterol 100-25 MCG/INH Aepb  Commonly known as:  BREO ELLIPTA  Inhale 1 puff into the lungs daily.     furosemide 40 MG tablet  Commonly known as:  LASIX  Take 1 tablet (40 mg total) by mouth daily.     levofloxacin 500 MG tablet  Commonly known as:  LEVAQUIN  Take 1 tablet (500 mg total) by mouth daily.     lovastatin 40 MG tablet  Commonly known as:  MEVACOR  Take 40 mg by mouth at bedtime.     methylPREDNIsolone 4 MG tablet  Commonly known as:  MEDROL DOSPACK  Taper:  6 tablets day one, than 5 tablets day two, 4 tablets day three, 3 tablets day four, 2 tablets day  five, 1 tablet day six     montelukast 10 MG tablet  Commonly known as:  SINGULAIR  Take 10 mg by mouth at bedtime.     pantoprazole 40 MG tablet  Commonly known as:  PROTONIX  Take 40 mg by mouth at bedtime.     polyethylene glycol packet  Commonly known as:  MIRALAX / GLYCOLAX  Take 17 g by mouth daily.     potassium chloride SA 20 MEQ tablet  Commonly known as:  K-DUR,KLOR-CON  Take 1 tablet (20 mEq total) by mouth daily.     tiotropium 18 MCG inhalation capsule  Commonly known as:  SPIRIVA  Place 18 mcg into inhaler and inhale daily.  SignedTarri Fuller 12/04/2013, 8:32 AM

## 2013-12-02 NOTE — Progress Notes (Signed)
Pre-op Cardiac Surgery  Carotid Findings:  40-59% ICA stenosis, bilaterally.  Vertebral artery flow is antegrde.  Upper Extremity Right Left  Brachial Pressures 127 T 123 T  Radial Waveforms T T  Ulnar Waveforms T T  Palmar Arch (Allen's Test) Doppler signal remains normal with radial compression and reverses with ulnar compression Doppler signal remains normal with radial compression and diminishes >50% with ulnar compression   Findings:      Lower  Extremity Right Left  Dorsalis Pedis    Anterior Tibial    Posterior Tibial    Ankle/Brachial Indices      Findings:  Palpable

## 2013-12-02 NOTE — Progress Notes (Signed)
Subjective: Feeling better and breathing better   Objective: Vital signs in last 24 hours: Temp:  [97.3 F (36.3 C)-98.3 F (36.8 C)] 97.3 F (36.3 C) (02/23 0355) Pulse Rate:  [79-86] 86 (02/23 0940) Resp:  [18-20] 20 (02/23 0355) BP: (114-122)/(50-72) 118/68 mmHg (02/23 0940) SpO2:  [88 %-96 %] 94 % (02/23 0940) Weight:  [221 lb (100.245 kg)] 221 lb (100.245 kg) (02/23 0355) Last BM Date: 12/02/13  Intake/Output from previous day: 02/22 0701 - 02/23 0700 In: 720 [P.O.:720] Out: 2050 [Urine:2050] Intake/Output this shift: Total I/O In: 240 [P.O.:240] Out: -   Medications Current Facility-Administered Medications  Medication Dose Route Frequency Provider Last Rate Last Dose  . 0.9 %  sodium chloride infusion  250 mL Intravenous PRN Sherren Mocha, MD      . acetaminophen (TYLENOL) tablet 650 mg  650 mg Oral Q4H PRN Cecilie Kicks, NP      . aspirin EC tablet 81 mg  81 mg Oral QHS Sherren Mocha, MD   81 mg at 12/01/13 2234  . diltiazem (CARDIZEM CD) 24 hr capsule 120 mg  120 mg Oral QHS Sherren Mocha, MD   120 mg at 12/01/13 2234  . furosemide (LASIX) tablet 40 mg  40 mg Oral Daily Sherren Mocha, MD   40 mg at 12/02/13 9735  . heparin injection 5,000 Units  5,000 Units Subcutaneous 3 times per day Sherren Mocha, MD   5,000 Units at 12/02/13 [phone]  . levalbuterol (XOPENEX) nebulizer solution 0.63 mg  0.63 mg Nebulization QID Mihai Croitoru, MD      . levofloxacin (LEVAQUIN) tablet 500 mg  500 mg Oral Daily Noralee Space, MD   500 mg at 12/02/13 2426  . methylPREDNISolone sodium succinate (SOLU-MEDROL) 40 mg/mL injection 40 mg  40 mg Intravenous Q12H Noralee Space, MD   40 mg at 12/02/13 8341  . mometasone-formoterol (DULERA) 100-5 MCG/ACT inhaler 1 puff  1 puff Inhalation BID Charmian Muff Avant, RPH   1 puff at 12/01/13 2341  . montelukast (SINGULAIR) tablet 10 mg  10 mg Oral QHS Sherren Mocha, MD   10 mg at 12/01/13 2234  . pantoprazole (PROTONIX) EC tablet 40 mg  40  mg Oral QHS Sherren Mocha, MD   40 mg at 12/01/13 2234  . polyethylene glycol (MIRALAX / GLYCOLAX) packet 17 g  17 g Oral Daily Evelene Croon Barrett, PA-C   17 g at 12/01/13 2012  . potassium chloride SA (K-DUR,KLOR-CON) CR tablet 40 mEq  40 mEq Oral Daily Peter M Martinique, MD   40 mEq at 12/02/13 9622  . simvastatin (ZOCOR) tablet 10 mg  10 mg Oral q1800 Sherren Mocha, MD   10 mg at 12/01/13 1644  . sodium chloride 0.9 % injection 3 mL  3 mL Intravenous Q12H Sherren Mocha, MD   3 mL at 12/02/13 [phone]  . sodium chloride 0.9 % injection 3 mL  3 mL Intravenous PRN Sherren Mocha, MD   3 mL at 11/30/13 2135  . tiotropium (SPIRIVA) inhalation capsule 18 mcg  18 mcg Inhalation Daily Sherren Mocha, MD   18 mcg at 12/01/13 [phone]    PE: General appearance: alert, cooperative and no distress Neck: no JVD Lungs: Rhonchi on the right. No Wheezing.   Heart: regular rate and rhythm and 2/6 sys MM. Abdomen: +BS, Non tender. Extremities: No LEE Pulses: 2+ and symmetric Skin: Warm and dry Neurologic: Grossly normal  Lab Results:  No results found for this basename: WBC, HGB, HCT,  PLT,  in the last 72 hours BMET  Recent Labs  11/30/13 0350 12/01/13 0625  NA 139 137  K 3.6* 4.0  CL 97 94*  CO2 29 33*  GLUCOSE 99 102*  BUN 16 19  CREATININE 1.01 1.11*  CALCIUM 8.9 9.4    Assessment/Plan  Active Problems:   Acute diastolic CHF (congestive heart failure), NYHA class 4   Severe aortic stenosis  Plan:  Net fluids:  -1.3L/7.5L.  On Lasix 40mg  PO daily.    CXR yesterday shows persistent right pleural effusion which I think looks decrease a little.  CHF improved.  BNP decreased by 2/3.  Mild increase in SCr.    AVR and MVR tentatively planned for March 3 with Dr. Cyndia Bent.    I think she can DC home today.  I discussed daily weights and adding increased lasix.    NSTEMI with peak troponin 0.78  Thought to be demand isch.    Consult by PCCM appreciated.   On levaquin. Medrol Dulera, spiriva,  singulair.   LOS: 4 days    Charnice Zwilling 12/02/2013 11:48 AM

## 2013-12-03 ENCOUNTER — Encounter (HOSPITAL_COMMUNITY): Payer: Self-pay

## 2013-12-03 ENCOUNTER — Ambulatory Visit (HOSPITAL_COMMUNITY): Admit: 2013-12-03 | Payer: Medicare Other | Admitting: Cardiovascular Disease

## 2013-12-03 SURGERY — LEFT AND RIGHT HEART CATHETERIZATION WITH CORONARY ANGIOGRAM
Anesthesia: LOCAL

## 2013-12-03 NOTE — Progress Notes (Signed)
STAFF NOTE: I, Dr Ann Lions have personally reviewed patient's available data, including medical history, events of note, physical examination and test results as part of my evaluation. I have discussed with resident/NP and other care providers such as pharmacist, RN and RRT.  In addition,  I personally evaluated patient and elicited key findings of AECOPD v AE- Diast chf related to critical aortic stenosis. I lean towards latter. Ok to treat for aECOPD. IF she feels well I will not see her in office at her scheduled appt in few days (she will call and let us know) due to bad weather. She is due for surgery in 10 days for AS.  Rest per NP/medical resident whose note is outlined above and that I agree with    Dr. Brand Males, [name], [facility].C.P Pulmonary and Critical Care Medicine Staff Physician [facility] Pager: [phone], If no answer or between  15:00h - 7:00h: call [phone]  12/03/2013 7:48 AM

## 2013-12-04 ENCOUNTER — Telehealth: Payer: Self-pay | Admitting: Oncology

## 2013-12-04 NOTE — Discharge Summary (Signed)
Agree with note and plans for discharge as outlined. See my note from earlier on the date of discharge.

## 2013-12-04 NOTE — Progress Notes (Signed)
Assessment unchanged. Discussed D/C instructions with pt and family including medications and f/u appointments. IV and tele removed. Pt left with belongings accompanied by RN.

## 2013-12-04 NOTE — Telephone Encounter (Signed)
Talked to pt, she r/s appt to [facility] and MD

## 2013-12-05 ENCOUNTER — Encounter (HOSPITAL_COMMUNITY): Payer: Self-pay | Admitting: Pharmacy Technician

## 2013-12-05 ENCOUNTER — Ambulatory Visit: Payer: Medicare Other | Admitting: Adult Health

## 2013-12-06 ENCOUNTER — Encounter (HOSPITAL_COMMUNITY)
Admission: RE | Admit: 2013-12-06 | Discharge: 2013-12-06 | Disposition: A | Discharge status: Home / Self Care | Payer: Medicare Other | Source: Ambulatory Visit | Attending: Surgery | Admitting: Surgery

## 2013-12-06 ENCOUNTER — Encounter (HOSPITAL_COMMUNITY): Payer: Self-pay

## 2013-12-06 VITALS — BP 122/70 | HR 82 | Temp 97.5°F | Resp 18 | Ht 68.0 in | Wt 214.2 lb

## 2013-12-06 DIAGNOSIS — I059 Rheumatic mitral valve disease, unspecified: Secondary | ICD-10-CM

## 2013-12-06 DIAGNOSIS — Z01812 Encounter for preprocedural laboratory examination: Secondary | ICD-10-CM | POA: Insufficient documentation

## 2013-12-06 DIAGNOSIS — I359 Nonrheumatic aortic valve disorder, unspecified: Secondary | ICD-10-CM

## 2013-12-06 LAB — COMPREHENSIVE METABOLIC PANEL
ALT: 11 U/L (ref 0–35)
AST: 16 U/L (ref 0–37)
Albumin: 3.7 g/dL (ref 3.5–5.2)
Alkaline Phosphatase: 68 U/L (ref 39–117)
BUN: 33 mg/dL — ABNORMAL HIGH (ref 6–23)
CO2: 22 mEq/L (ref 19–32)
Calcium: 10.1 mg/dL (ref 8.4–10.5)
Chloride: 96 mEq/L (ref 96–112)
Creatinine, Ser: 1.21 mg/dL — ABNORMAL HIGH (ref 0.50–1.10)
GFR calc Af Amer: 52 mL/min — ABNORMAL LOW (ref >90)
GFR calc non Af Amer: 45 mL/min — ABNORMAL LOW (ref >90)
Glucose, Bld: 89 mg/dL (ref 70–99)
Potassium: 4.5 mEq/L (ref 3.7–5.3)
Sodium: 135 mEq/L — ABNORMAL LOW (ref 137–147)
Total Bilirubin: 0.6 mg/dL (ref 0.3–1.2)
Total Protein: 7.8 g/dL (ref 6.0–8.3)

## 2013-12-06 LAB — CBC
HCT: 41.9 % (ref 36.0–46.0)
Hemoglobin: 14.5 g/dL (ref 12.0–15.0)
MCH: 31.5 pg (ref 26.0–34.0)
MCHC: 34.6 g/dL (ref 30.0–36.0)
MCV: 90.9 fL (ref 78.0–100.0)
Platelets: 338 10*3/uL (ref 150–400)
RBC: 4.61 MIL/uL (ref 3.87–5.11)
RDW: 14 % (ref 11.5–15.5)
WBC: 10.2 10*3/uL (ref 4.0–10.5)

## 2013-12-06 LAB — BLOOD GAS, ARTERIAL
Acid-Base Excess: 2.5 mmol/L — ABNORMAL HIGH (ref 0.0–2.0)
Bicarbonate: 25.9 mEq/L — ABNORMAL HIGH (ref 20.0–24.0)
Drawn by: 344381
FIO2: 0.21 %
O2 Saturation: 97.2 %
Patient temperature: 98.6
TCO2: 26.9 mmol/L (ref 0–100)
pCO2 arterial: 35.2 mmHg (ref 35.0–45.0)
pH, Arterial: 7.48 — ABNORMAL HIGH (ref 7.350–7.450)
pO2, Arterial: 83.1 mmHg (ref 80.0–100.0)

## 2013-12-06 LAB — PROTIME-INR
INR: 1.02 (ref 0.00–1.49)
Prothrombin Time: 13.2 seconds (ref 11.6–15.2)

## 2013-12-06 LAB — URINALYSIS, ROUTINE W REFLEX MICROSCOPIC
Bilirubin Urine: NEGATIVE
Glucose, UA: NEGATIVE mg/dL
Hgb urine dipstick: NEGATIVE
Ketones, ur: NEGATIVE mg/dL
Nitrite: NEGATIVE
Protein, ur: NEGATIVE mg/dL
Specific Gravity, Urine: 1.014 (ref 1.005–1.030)
Urobilinogen, UA: 0.2 mg/dL (ref 0.0–1.0)
pH: 6.5 (ref 5.0–8.0)

## 2013-12-06 LAB — SURGICAL PCR SCREEN
MRSA, PCR: NEGATIVE
Staphylococcus aureus: NEGATIVE

## 2013-12-06 LAB — APTT: aPTT: 27 seconds (ref 24–37)

## 2013-12-06 LAB — HEMOGLOBIN A1C
Hgb A1c MFr Bld: 5.5 % (ref <5.7)
Mean Plasma Glucose: 111 mg/dL (ref <117)

## 2013-12-06 LAB — URINE MICROSCOPIC-ADD ON

## 2013-12-06 MED ORDER — CHLORHEXIDINE GLUCONATE 4 % EX LIQD: 30.0000 mL | CUTANEOUS | Status: DC

## 2013-12-06 NOTE — Pre-Procedure Instructions (Signed)
Eather LEESHA VENO  12/06/2013   Your procedure is scheduled on:  12/10/13  Report to [facility]  2 * 3 at 530 AM.  Call this number if you have problems the morning of surgery: [phone]   Remember:   Do not eat food or drink liquids after midnight.   Take these medicines the morning of surgery with A SIP OF WATER: all inhalers,dilitazem,protonix   Do not wear jewelry, make-up or nail polish.  Do not wear lotions, powders, or perfumes. You may wear deodorant.  Do not shave 48 hours prior to surgery. Men may shave face and neck.  Do not bring valuables to the hospital.  [facility] is not responsible                  for any belongings or valuables.               Contacts, dentures or bridgework may not be worn into surgery.  Leave suitcase in the car. After surgery it may be brought to your room.  For patients admitted to the hospital, discharge time is determined by your                treatment team.               Patients discharged the day of surgery will not be allowed to drive  home.  Name and phone number of your driver: family  Special Instructions: Shower using CHG 2 nights before surgery and the night before surgery.  If you shower the day of surgery use CHG.  Use special wash - you have one bottle of CHG for all showers.  You should use approximately 1/3 of the bottle for each shower.   Please read over the following fact sheets that you were given: Pain Booklet, Coughing and Deep Breathing, Blood Transfusion Information, MRSA Information and Surgical Site Infection Prevention

## 2013-12-08 DIAGNOSIS — Z952 Presence of prosthetic heart valve: Secondary | ICD-10-CM | POA: Insufficient documentation

## 2013-12-08 HISTORY — DX: Presence of prosthetic heart valve: Z95.2

## 2013-12-09 ENCOUNTER — Other Ambulatory Visit: Payer: Self-pay | Admitting: *Deleted

## 2013-12-11 MED ORDER — DEXTROSE 5 % IV SOLN
750.0000 mg | INTRAVENOUS | Status: DC
  Filled 2013-12-11: qty 750

## 2013-12-11 MED ORDER — METOPROLOL TARTRATE 12.5 MG HALF TABLET
12.5000 mg | ORAL_TABLET | Freq: Once | ORAL | Status: AC
  Administered 2013-12-12: 12.5 mg via ORAL

## 2013-12-11 MED ORDER — SODIUM CHLORIDE 0.9 % IV SOLN
INTRAVENOUS | Status: DC
  Filled 2013-12-11: qty 40

## 2013-12-11 MED ORDER — MAGNESIUM SULFATE 50 % IJ SOLN
40.0000 meq | INTRAMUSCULAR | Status: DC
  Filled 2013-12-11: qty 10

## 2013-12-11 MED ORDER — DEXMEDETOMIDINE HCL IN NACL 400 MCG/100ML IV SOLN
0.1000 ug/kg/h | INTRAVENOUS | Status: AC
  Administered 2013-12-12: 0.3 ug/kg/h via INTRAVENOUS
  Filled 2013-12-11: qty 100

## 2013-12-11 MED ORDER — NITROGLYCERIN IN D5W 200-5 MCG/ML-% IV SOLN
2.0000 ug/min | INTRAVENOUS | Status: DC
  Filled 2013-12-11: qty 250

## 2013-12-11 MED ORDER — DOPAMINE-DEXTROSE 3.2-5 MG/ML-% IV SOLN
2.0000 ug/kg/min | INTRAVENOUS | Status: DC
  Filled 2013-12-11: qty 250

## 2013-12-11 MED ORDER — EPINEPHRINE HCL 1 MG/ML IJ SOLN
0.5000 ug/min | INTRAVENOUS | Status: DC
  Filled 2013-12-11: qty 4

## 2013-12-11 MED ORDER — POTASSIUM CHLORIDE 2 MEQ/ML IV SOLN
80.0000 meq | INTRAVENOUS | Status: DC
  Filled 2013-12-11: qty 40

## 2013-12-11 MED ORDER — VANCOMYCIN HCL 10 G IV SOLR
1250.0000 mg | INTRAVENOUS | Status: AC
  Administered 2013-12-12: 1250 mg via INTRAVENOUS
  Filled 2013-12-11 (×2): qty 1250

## 2013-12-11 MED ORDER — PHENYLEPHRINE HCL 10 MG/ML IJ SOLN
30.0000 ug/min | INTRAVENOUS | Status: AC
  Administered 2013-12-12: 30 ug/min via INTRAVENOUS
  Filled 2013-12-11: qty 2

## 2013-12-11 MED ORDER — DEXTROSE 5 % IV SOLN
1.5000 g | INTRAVENOUS | Status: AC
  Administered 2013-12-12: .75 g via INTRAVENOUS
  Administered 2013-12-12: 1.5 g via INTRAVENOUS
  Filled 2013-12-11 (×2): qty 1.5

## 2013-12-11 MED ORDER — HEPARIN SODIUM (PORCINE) 1000 UNIT/ML IJ SOLN
INTRAMUSCULAR | Status: DC
  Filled 2013-12-11: qty 30

## 2013-12-11 MED ORDER — PLASMA-LYTE 148 IV SOLN
INTRAVENOUS | Status: DC
  Filled 2013-12-11: qty 2.5

## 2013-12-11 MED ORDER — SODIUM CHLORIDE 0.9 % IV SOLN
INTRAVENOUS | Status: AC
  Administered 2013-12-12: 1.7 [IU]/h via INTRAVENOUS
  Filled 2013-12-11: qty 1

## 2013-12-12 ENCOUNTER — Encounter (HOSPITAL_COMMUNITY): Payer: Self-pay | Admitting: Certified Registered"

## 2013-12-12 ENCOUNTER — Inpatient Hospital Stay (HOSPITAL_COMMUNITY)
Admission: RE | Admit: 2013-12-12 | Discharge: 2013-12-24 | DRG: 219 | Disposition: A | Discharge status: Home / Self Care | Payer: Medicare Other | Source: Ambulatory Visit | Attending: Surgery | Admitting: Surgery

## 2013-12-12 ENCOUNTER — Encounter (HOSPITAL_COMMUNITY): Payer: Medicare Other | Admitting: Certified Registered"

## 2013-12-12 ENCOUNTER — Encounter (HOSPITAL_COMMUNITY)
Admission: RE | Disposition: A | Discharge status: Home / Self Care | Payer: Medicare Other | Source: Ambulatory Visit | Attending: Surgery

## 2013-12-12 ENCOUNTER — Inpatient Hospital Stay (HOSPITAL_COMMUNITY): Payer: Medicare Other

## 2013-12-12 ENCOUNTER — Inpatient Hospital Stay (HOSPITAL_COMMUNITY): Payer: Medicare Other | Admitting: Certified Registered"

## 2013-12-12 DIAGNOSIS — E119 Type 2 diabetes mellitus without complications: Secondary | ICD-10-CM | POA: Diagnosis present

## 2013-12-12 DIAGNOSIS — J9819 Other pulmonary collapse: Secondary | ICD-10-CM | POA: Diagnosis not present

## 2013-12-12 DIAGNOSIS — Z952 Presence of prosthetic heart valve: Secondary | ICD-10-CM

## 2013-12-12 DIAGNOSIS — I452 Bifascicular block: Secondary | ICD-10-CM | POA: Diagnosis not present

## 2013-12-12 DIAGNOSIS — I498 Other specified cardiac arrhythmias: Secondary | ICD-10-CM | POA: Diagnosis not present

## 2013-12-12 DIAGNOSIS — Z950 Presence of cardiac pacemaker: Secondary | ICD-10-CM

## 2013-12-12 DIAGNOSIS — I10 Essential (primary) hypertension: Secondary | ICD-10-CM | POA: Diagnosis present

## 2013-12-12 DIAGNOSIS — Z8249 Family history of ischemic heart disease and other diseases of the circulatory system: Secondary | ICD-10-CM

## 2013-12-12 DIAGNOSIS — I509 Heart failure, unspecified: Secondary | ICD-10-CM | POA: Diagnosis present

## 2013-12-12 DIAGNOSIS — Z85048 Personal history of other malignant neoplasm of rectum, rectosigmoid junction, and anus: Secondary | ICD-10-CM

## 2013-12-12 DIAGNOSIS — I441 Atrioventricular block, second degree: Secondary | ICD-10-CM | POA: Diagnosis not present

## 2013-12-12 DIAGNOSIS — Z932 Ileostomy status: Secondary | ICD-10-CM

## 2013-12-12 DIAGNOSIS — I440 Atrioventricular block, first degree: Secondary | ICD-10-CM | POA: Diagnosis not present

## 2013-12-12 DIAGNOSIS — Z885 Allergy status to narcotic agent status: Secondary | ICD-10-CM

## 2013-12-12 DIAGNOSIS — F411 Generalized anxiety disorder: Secondary | ICD-10-CM | POA: Diagnosis present

## 2013-12-12 DIAGNOSIS — R911 Solitary pulmonary nodule: Secondary | ICD-10-CM

## 2013-12-12 DIAGNOSIS — C20 Malignant neoplasm of rectum: Secondary | ICD-10-CM

## 2013-12-12 DIAGNOSIS — I442 Atrioventricular block, complete: Secondary | ICD-10-CM | POA: Diagnosis not present

## 2013-12-12 DIAGNOSIS — Z882 Allergy status to sulfonamides status: Secondary | ICD-10-CM

## 2013-12-12 DIAGNOSIS — J4489 Other specified chronic obstructive pulmonary disease: Secondary | ICD-10-CM | POA: Diagnosis present

## 2013-12-12 DIAGNOSIS — J449 Chronic obstructive pulmonary disease, unspecified: Secondary | ICD-10-CM | POA: Diagnosis present

## 2013-12-12 DIAGNOSIS — Z923 Personal history of irradiation: Secondary | ICD-10-CM

## 2013-12-12 DIAGNOSIS — I2789 Other specified pulmonary heart diseases: Secondary | ICD-10-CM | POA: Diagnosis present

## 2013-12-12 DIAGNOSIS — I359 Nonrheumatic aortic valve disorder, unspecified: Secondary | ICD-10-CM

## 2013-12-12 DIAGNOSIS — E785 Hyperlipidemia, unspecified: Secondary | ICD-10-CM | POA: Diagnosis present

## 2013-12-12 DIAGNOSIS — D62 Acute posthemorrhagic anemia: Secondary | ICD-10-CM | POA: Diagnosis not present

## 2013-12-12 DIAGNOSIS — D6959 Other secondary thrombocytopenia: Secondary | ICD-10-CM | POA: Diagnosis not present

## 2013-12-12 DIAGNOSIS — Z801 Family history of malignant neoplasm of trachea, bronchus and lung: Secondary | ICD-10-CM

## 2013-12-12 DIAGNOSIS — G4730 Sleep apnea, unspecified: Secondary | ICD-10-CM | POA: Diagnosis present

## 2013-12-12 DIAGNOSIS — Z7982 Long term (current) use of aspirin: Secondary | ICD-10-CM

## 2013-12-12 DIAGNOSIS — Z9221 Personal history of antineoplastic chemotherapy: Secondary | ICD-10-CM

## 2013-12-12 DIAGNOSIS — Z888 Allergy status to other drugs, medicaments and biological substances status: Secondary | ICD-10-CM

## 2013-12-12 DIAGNOSIS — I5031 Acute diastolic (congestive) heart failure: Secondary | ICD-10-CM | POA: Diagnosis present

## 2013-12-12 DIAGNOSIS — Z833 Family history of diabetes mellitus: Secondary | ICD-10-CM

## 2013-12-12 DIAGNOSIS — Z432 Encounter for attention to ileostomy: Secondary | ICD-10-CM

## 2013-12-12 DIAGNOSIS — I059 Rheumatic mitral valve disease, unspecified: Secondary | ICD-10-CM

## 2013-12-12 DIAGNOSIS — I080 Rheumatic disorders of both mitral and aortic valves: Principal | ICD-10-CM | POA: Diagnosis present

## 2013-12-12 DIAGNOSIS — Z85038 Personal history of other malignant neoplasm of large intestine: Secondary | ICD-10-CM

## 2013-12-12 DIAGNOSIS — Z87891 Personal history of nicotine dependence: Secondary | ICD-10-CM

## 2013-12-12 DIAGNOSIS — E669 Obesity, unspecified: Secondary | ICD-10-CM | POA: Diagnosis present

## 2013-12-12 HISTORY — PX: INTRAOPERATIVE TRANSESOPHAGEAL ECHOCARDIOGRAM: SHX5062

## 2013-12-12 HISTORY — PX: MITRAL VALVE REPLACEMENT: SHX147

## 2013-12-12 HISTORY — PX: AORTIC VALVE REPLACEMENT: SHX41

## 2013-12-12 LAB — POCT I-STAT 3, ART BLOOD GAS (G3+)
Acid-Base Excess: 1 mmol/L (ref 0.0–2.0)
Acid-base deficit: 1 mmol/L (ref 0.0–2.0)
Acid-base deficit: 4 mmol/L — ABNORMAL HIGH (ref 0.0–2.0)
Acid-base deficit: 3 mmol/L — ABNORMAL HIGH (ref 0.0–2.0)
Acid-base deficit: 2 mmol/L (ref 0.0–2.0)
Bicarbonate: 26.2 mEq/L — ABNORMAL HIGH (ref 20.0–24.0)
Bicarbonate: 25.5 mEq/L — ABNORMAL HIGH (ref 20.0–24.0)
Bicarbonate: 22.7 mEq/L (ref 20.0–24.0)
Bicarbonate: 23.5 mEq/L (ref 20.0–24.0)
Bicarbonate: 23.7 mEq/L (ref 20.0–24.0)
O2 Saturation: 100 %
O2 Saturation: 100 %
O2 Saturation: 99 %
O2 Saturation: 92 %
O2 Saturation: 98 %
Patient temperature: 36.1
Patient temperature: 36.4
Patient temperature: 37.8
TCO2: 27 mmol/L (ref 0–100)
TCO2: 27 mmol/L (ref 0–100)
TCO2: 24 mmol/L (ref 0–100)
TCO2: 25 mmol/L (ref 0–100)
TCO2: 25 mmol/L (ref 0–100)
pCO2 arterial: 40.8 mmHg (ref 35.0–45.0)
pCO2 arterial: 51.9 mmHg — ABNORMAL HIGH (ref 35.0–45.0)
pCO2 arterial: 44.6 mmHg (ref 35.0–45.0)
pCO2 arterial: 47.5 mmHg — ABNORMAL HIGH (ref 35.0–45.0)
pCO2 arterial: 43.5 mmHg (ref 35.0–45.0)
pH, Arterial: 7.415 (ref 7.350–7.450)
pH, Arterial: 7.299 — ABNORMAL LOW (ref 7.350–7.450)
pH, Arterial: 7.311 — ABNORMAL LOW (ref 7.350–7.450)
pH, Arterial: 7.299 — ABNORMAL LOW (ref 7.350–7.450)
pH, Arterial: 7.348 — ABNORMAL LOW (ref 7.350–7.450)
pO2, Arterial: 344 mmHg — ABNORMAL HIGH (ref 80.0–100.0)
pO2, Arterial: 403 mmHg — ABNORMAL HIGH (ref 80.0–100.0)
pO2, Arterial: 153 mmHg — ABNORMAL HIGH (ref 80.0–100.0)
pO2, Arterial: 69 mmHg — ABNORMAL LOW (ref 80.0–100.0)
pO2, Arterial: 110 mmHg — ABNORMAL HIGH (ref 80.0–100.0)

## 2013-12-12 LAB — POCT I-STAT 4, (NA,K, GLUC, HGB,HCT)
Glucose, Bld: 115 mg/dL — ABNORMAL HIGH (ref 70–99)
Glucose, Bld: 115 mg/dL — ABNORMAL HIGH (ref 70–99)
Glucose, Bld: 103 mg/dL — ABNORMAL HIGH (ref 70–99)
Glucose, Bld: 118 mg/dL — ABNORMAL HIGH (ref 70–99)
Glucose, Bld: 124 mg/dL — ABNORMAL HIGH (ref 70–99)
Glucose, Bld: 135 mg/dL — ABNORMAL HIGH (ref 70–99)
Glucose, Bld: 149 mg/dL — ABNORMAL HIGH (ref 70–99)
Glucose, Bld: 133 mg/dL — ABNORMAL HIGH (ref 70–99)
HCT: 37 % (ref 36.0–46.0)
HCT: 33 % — ABNORMAL LOW (ref 36.0–46.0)
HCT: 25 % — ABNORMAL LOW (ref 36.0–46.0)
HCT: 25 % — ABNORMAL LOW (ref 36.0–46.0)
HCT: 25 % — ABNORMAL LOW (ref 36.0–46.0)
HCT: 24 % — ABNORMAL LOW (ref 36.0–46.0)
HCT: 26 % — ABNORMAL LOW (ref 36.0–46.0)
HCT: 37 % (ref 36.0–46.0)
Hemoglobin: 12.6 g/dL (ref 12.0–15.0)
Hemoglobin: 11.2 g/dL — ABNORMAL LOW (ref 12.0–15.0)
Hemoglobin: 8.5 g/dL — ABNORMAL LOW (ref 12.0–15.0)
Hemoglobin: 8.5 g/dL — ABNORMAL LOW (ref 12.0–15.0)
Hemoglobin: 8.5 g/dL — ABNORMAL LOW (ref 12.0–15.0)
Hemoglobin: 8.2 g/dL — ABNORMAL LOW (ref 12.0–15.0)
Hemoglobin: 8.8 g/dL — ABNORMAL LOW (ref 12.0–15.0)
Hemoglobin: 12.6 g/dL (ref 12.0–15.0)
Potassium: 3.8 mEq/L (ref 3.7–5.3)
Potassium: 3.7 mEq/L (ref 3.7–5.3)
Potassium: 4.1 mEq/L (ref 3.7–5.3)
Potassium: 3.8 mEq/L (ref 3.7–5.3)
Potassium: 4 mEq/L (ref 3.7–5.3)
Potassium: 4.1 mEq/L (ref 3.7–5.3)
Potassium: 4.9 mEq/L (ref 3.7–5.3)
Potassium: 4 mEq/L (ref 3.7–5.3)
Sodium: 138 mEq/L (ref 137–147)
Sodium: 137 mEq/L (ref 137–147)
Sodium: 135 mEq/L — ABNORMAL LOW (ref 137–147)
Sodium: 137 mEq/L (ref 137–147)
Sodium: 138 mEq/L (ref 137–147)
Sodium: 138 mEq/L (ref 137–147)
Sodium: 138 mEq/L (ref 137–147)
Sodium: 140 mEq/L (ref 137–147)

## 2013-12-12 LAB — POCT I-STAT, CHEM 8
BUN: 17 mg/dL (ref 6–23)
Calcium, Ion: 1.14 mmol/L (ref 1.13–1.30)
Chloride: 102 mEq/L (ref 96–112)
Creatinine, Ser: 0.8 mg/dL (ref 0.50–1.10)
Glucose, Bld: 143 mg/dL — ABNORMAL HIGH (ref 70–99)
HCT: 25 % — ABNORMAL LOW (ref 36.0–46.0)
Hemoglobin: 8.5 g/dL — ABNORMAL LOW (ref 12.0–15.0)
Potassium: 4 mEq/L (ref 3.7–5.3)
Sodium: 141 mEq/L (ref 137–147)
TCO2: 22 mmol/L (ref 0–100)

## 2013-12-12 LAB — PROTIME-INR
INR: 1.49 (ref 0.00–1.49)
Prothrombin Time: 17.6 seconds — ABNORMAL HIGH (ref 11.6–15.2)

## 2013-12-12 LAB — CBC
HCT: 30.7 % — ABNORMAL LOW (ref 36.0–46.0)
Hemoglobin: 10.4 g/dL — ABNORMAL LOW (ref 12.0–15.0)
MCH: 31 pg (ref 26.0–34.0)
MCHC: 33.9 g/dL (ref 30.0–36.0)
MCV: 91.4 fL (ref 78.0–100.0)
Platelets: 190 10*3/uL (ref 150–400)
RBC: 3.36 MIL/uL — ABNORMAL LOW (ref 3.87–5.11)
RDW: 14.3 % (ref 11.5–15.5)
WBC: 21.9 10*3/uL — ABNORMAL HIGH (ref 4.0–10.5)

## 2013-12-12 LAB — POCT I-STAT GLUCOSE
Glucose, Bld: 126 mg/dL — ABNORMAL HIGH (ref 70–99)
Operator id: 3293

## 2013-12-12 LAB — PLATELET COUNT: Platelets: 108 10*3/uL — ABNORMAL LOW (ref 150–400)

## 2013-12-12 LAB — HEMOGLOBIN AND HEMATOCRIT, BLOOD
HCT: 23.6 % — ABNORMAL LOW (ref 36.0–46.0)
Hemoglobin: 8.3 g/dL — ABNORMAL LOW (ref 12.0–15.0)

## 2013-12-12 LAB — MAGNESIUM: Magnesium: 3 mg/dL — ABNORMAL HIGH (ref 1.5–2.5)

## 2013-12-12 LAB — CREATININE, SERUM
Creatinine, Ser: 0.82 mg/dL (ref 0.50–1.10)
GFR calc Af Amer: 83 mL/min — ABNORMAL LOW (ref >90)
GFR calc non Af Amer: 72 mL/min — ABNORMAL LOW (ref >90)

## 2013-12-12 LAB — APTT: aPTT: 39 seconds — ABNORMAL HIGH (ref 24–37)

## 2013-12-12 LAB — PREPARE RBC (CROSSMATCH)

## 2013-12-12 SURGERY — REPLACEMENT, AORTIC VALVE, OPEN
Anesthesia: General | Site: Chest

## 2013-12-12 MED ORDER — ASPIRIN 81 MG PO CHEW: 324.0000 mg | CHEWABLE_TABLET | Freq: Every day | ORAL | Status: DC

## 2013-12-12 MED ORDER — ARTIFICIAL TEARS OP OINT
TOPICAL_OINTMENT | OPHTHALMIC | Status: DC | PRN
  Administered 2013-12-12: 1 via OPHTHALMIC

## 2013-12-12 MED ORDER — ACETAMINOPHEN 500 MG PO TABS
1000.0000 mg | ORAL_TABLET | Freq: Four times a day (QID) | ORAL | Status: AC
Start: 2013-12-13 — End: 2013-12-17
  Administered 2013-12-13 – 2013-12-17 (×20): 1000 mg via ORAL
  Filled 2013-12-12 (×22): qty 2

## 2013-12-12 MED ORDER — LACTATED RINGERS IV SOLN
INTRAVENOUS | Status: DC | PRN
  Administered 2013-12-12: 07:00:00 via INTRAVENOUS

## 2013-12-12 MED ORDER — ROCURONIUM BROMIDE 100 MG/10ML IV SOLN
INTRAVENOUS | Status: DC | PRN
  Administered 2013-12-12: 30 mg via INTRAVENOUS
  Administered 2013-12-12: 20 mg via INTRAVENOUS
  Administered 2013-12-12 (×3): 50 mg via INTRAVENOUS

## 2013-12-12 MED ORDER — MOMETASONE FURO-FORMOTEROL FUM 100-5 MCG/ACT IN AERO
2.0000 | INHALATION_SPRAY | Freq: Two times a day (BID) | RESPIRATORY_TRACT | Status: DC
  Administered 2013-12-13 – 2013-12-24 (×23): 2 via RESPIRATORY_TRACT
  Filled 2013-12-12 (×2): qty 8.8

## 2013-12-12 MED ORDER — PROTAMINE SULFATE 10 MG/ML IV SOLN
INTRAVENOUS | Status: DC | PRN
  Administered 2013-12-12: 30 mg via INTRAVENOUS
  Administered 2013-12-12: 60 mg via INTRAVENOUS
  Administered 2013-12-12: 10 mg via INTRAVENOUS
  Administered 2013-12-12: 50 mg via INTRAVENOUS
  Administered 2013-12-12 (×2): 60 mg via INTRAVENOUS
  Administered 2013-12-12: 30 mg via INTRAVENOUS

## 2013-12-12 MED ORDER — SODIUM CHLORIDE 0.45 % IV SOLN
INTRAVENOUS | Status: DC
  Administered 2013-12-12: 16:00:00 via INTRAVENOUS

## 2013-12-12 MED ORDER — ASPIRIN EC 325 MG PO TBEC
325.0000 mg | DELAYED_RELEASE_TABLET | Freq: Every day | ORAL | Status: DC
  Administered 2013-12-13: 325 mg via ORAL
  Filled 2013-12-12: qty 1

## 2013-12-12 MED ORDER — HEMOSTATIC AGENTS (NO CHARGE) OPTIME
TOPICAL | Status: DC | PRN
  Administered 2013-12-12: 1 via TOPICAL

## 2013-12-12 MED ORDER — MORPHINE SULFATE 2 MG/ML IJ SOLN
1.0000 mg | INTRAMUSCULAR | Status: AC | PRN
  Filled 2013-12-12: qty 1

## 2013-12-12 MED ORDER — ACETAMINOPHEN 160 MG/5ML PO SOLN: 1000.0000 mg | Freq: Four times a day (QID) | ORAL | Status: DC

## 2013-12-12 MED ORDER — SODIUM CHLORIDE 0.9 % IV SOLN
INTRAVENOUS | Status: DC
  Filled 2013-12-12: qty 40

## 2013-12-12 MED ORDER — METOCLOPRAMIDE HCL 5 MG/ML IJ SOLN
10.0000 mg | Freq: Four times a day (QID) | INTRAMUSCULAR | Status: AC
  Administered 2013-12-12 – 2013-12-13 (×4): 10 mg via INTRAVENOUS
  Filled 2013-12-12 (×4): qty 2

## 2013-12-12 MED ORDER — PHENYLEPHRINE HCL 10 MG/ML IJ SOLN
10.0000 mg | INTRAVENOUS | Status: DC | PRN
  Administered 2013-12-12: 10 ug/min via INTRAVENOUS

## 2013-12-12 MED ORDER — FAMOTIDINE IN NACL 20-0.9 MG/50ML-% IV SOLN
20.0000 mg | Freq: Two times a day (BID) | INTRAVENOUS | Status: AC
  Administered 2013-12-12 – 2013-12-13 (×2): 20 mg via INTRAVENOUS
  Filled 2013-12-12: qty 50

## 2013-12-12 MED ORDER — MAGNESIUM SULFATE 4000MG/100ML IJ SOLN
4.0000 g | Freq: Once | INTRAMUSCULAR | Status: AC
  Administered 2013-12-12: 4 g via INTRAVENOUS
  Filled 2013-12-12: qty 100

## 2013-12-12 MED ORDER — ROCURONIUM BROMIDE 50 MG/5ML IV SOLN
INTRAVENOUS | Status: AC
  Filled 2013-12-12: qty 1

## 2013-12-12 MED ORDER — MIDAZOLAM HCL 2 MG/2ML IJ SOLN: 2.0000 mg | INTRAMUSCULAR | Status: DC | PRN

## 2013-12-12 MED ORDER — LIDOCAINE HCL (CARDIAC) 20 MG/ML IV SOLN
INTRAVENOUS | Status: DC | PRN
  Administered 2013-12-12: 150 mg via INTRAVENOUS
  Administered 2013-12-12: 80 mg via INTRAVENOUS

## 2013-12-12 MED ORDER — MORPHINE SULFATE 2 MG/ML IJ SOLN
2.0000 mg | INTRAMUSCULAR | Status: DC | PRN
  Administered 2013-12-12 – 2013-12-13 (×3): 2 mg via INTRAVENOUS
  Filled 2013-12-12 (×3): qty 1

## 2013-12-12 MED ORDER — NITROGLYCERIN IN D5W 200-5 MCG/ML-% IV SOLN
0.0000 ug/min | INTRAVENOUS | Status: DC
Start: 2013-12-12 — End: 2013-12-13

## 2013-12-12 MED ORDER — INSULIN REGULAR BOLUS VIA INFUSION
0.0000 [IU] | Freq: Three times a day (TID) | INTRAVENOUS | Status: DC
  Filled 2013-12-12: qty 10

## 2013-12-12 MED ORDER — TIOTROPIUM BROMIDE MONOHYDRATE 18 MCG IN CAPS
18.0000 ug | ORAL_CAPSULE | Freq: Every day | RESPIRATORY_TRACT | Status: DC
  Administered 2013-12-13 – 2013-12-24 (×11): 18 ug via RESPIRATORY_TRACT
  Filled 2013-12-12 (×3): qty 5

## 2013-12-12 MED ORDER — SODIUM CHLORIDE 0.9 % IJ SOLN
INTRAMUSCULAR | Status: AC
  Filled 2013-12-12: qty 10

## 2013-12-12 MED ORDER — METOPROLOL TARTRATE 12.5 MG HALF TABLET
ORAL_TABLET | ORAL | Status: AC
  Administered 2013-12-12: 06:00:00
  Filled 2013-12-12: qty 1

## 2013-12-12 MED ORDER — DEXTROSE 5 % IV SOLN
1.5000 g | Freq: Two times a day (BID) | INTRAVENOUS | Status: AC
  Administered 2013-12-12 – 2013-12-14 (×4): 1.5 g via INTRAVENOUS
  Filled 2013-12-12 (×4): qty 1.5

## 2013-12-12 MED ORDER — ACETAMINOPHEN 160 MG/5ML PO SOLN: 650.0000 mg | Freq: Once | ORAL | Status: AC

## 2013-12-12 MED ORDER — LIDOCAINE HCL (CARDIAC) 20 MG/ML IV SOLN
INTRAVENOUS | Status: AC
  Filled 2013-12-12: qty 5

## 2013-12-12 MED ORDER — SODIUM BICARBONATE 8.4 % IV SOLN
50.0000 meq | Freq: Once | INTRAVENOUS | Status: AC
  Administered 2013-12-12: 50 meq via INTRAVENOUS

## 2013-12-12 MED ORDER — METOPROLOL TARTRATE 12.5 MG HALF TABLET
12.5000 mg | ORAL_TABLET | Freq: Two times a day (BID) | ORAL | Status: DC
  Filled 2013-12-12 (×3): qty 1

## 2013-12-12 MED ORDER — THROMBIN 20000 UNITS EX SOLR
OROMUCOSAL | Status: DC | PRN
  Administered 2013-12-12 (×2): via TOPICAL

## 2013-12-12 MED ORDER — SODIUM CHLORIDE 0.9 % IJ SOLN
3.0000 mL | Freq: Two times a day (BID) | INTRAMUSCULAR | Status: DC
  Administered 2013-12-13 – 2013-12-18 (×11): 3 mL via INTRAVENOUS

## 2013-12-12 MED ORDER — METOPROLOL TARTRATE 1 MG/ML IV SOLN: 2.5000 mg | INTRAVENOUS | Status: DC | PRN

## 2013-12-12 MED ORDER — LACTATED RINGERS IV SOLN: INTRAVENOUS | Status: DC

## 2013-12-12 MED ORDER — PHENYLEPHRINE 40 MCG/ML (10ML) SYRINGE FOR IV PUSH (FOR BLOOD PRESSURE SUPPORT)
PREFILLED_SYRINGE | INTRAVENOUS | Status: AC
  Filled 2013-12-12: qty 10

## 2013-12-12 MED ORDER — ALBUMIN HUMAN 5 % IV SOLN
INTRAVENOUS | Status: DC | PRN
  Administered 2013-12-12: 15:00:00 via INTRAVENOUS

## 2013-12-12 MED ORDER — HEPARIN SODIUM (PORCINE) 1000 UNIT/ML IJ SOLN
INTRAMUSCULAR | Status: DC | PRN
  Administered 2013-12-12: 35000 [IU] via INTRAVENOUS

## 2013-12-12 MED ORDER — SUCCINYLCHOLINE CHLORIDE 20 MG/ML IJ SOLN
INTRAMUSCULAR | Status: AC
  Filled 2013-12-12: qty 1

## 2013-12-12 MED ORDER — SODIUM CHLORIDE 0.9 % IV SOLN
INTRAVENOUS | Status: DC
  Administered 2013-12-12: 16:00:00 via INTRAVENOUS

## 2013-12-12 MED ORDER — PANTOPRAZOLE SODIUM 40 MG PO TBEC
40.0000 mg | DELAYED_RELEASE_TABLET | Freq: Every day | ORAL | Status: DC
  Administered 2013-12-14 – 2013-12-18 (×5): 40 mg via ORAL
  Filled 2013-12-12 (×5): qty 1

## 2013-12-12 MED ORDER — OXYCODONE HCL 5 MG PO TABS: 5.0000 mg | ORAL_TABLET | ORAL | Status: DC | PRN

## 2013-12-12 MED ORDER — LACTATED RINGERS IV SOLN
INTRAVENOUS | Status: DC | PRN
  Administered 2013-12-12 (×2): via INTRAVENOUS

## 2013-12-12 MED ORDER — MIDAZOLAM HCL 5 MG/5ML IJ SOLN
INTRAMUSCULAR | Status: DC | PRN
  Administered 2013-12-12: 1 mg via INTRAVENOUS
  Administered 2013-12-12: 4 mg via INTRAVENOUS
  Administered 2013-12-12: 2 mg via INTRAVENOUS
  Administered 2013-12-12: 4 mg via INTRAVENOUS
  Administered 2013-12-12: 1 mg via INTRAVENOUS

## 2013-12-12 MED ORDER — SODIUM CHLORIDE 0.9 % IJ SOLN: 3.0000 mL | INTRAMUSCULAR | Status: DC | PRN

## 2013-12-12 MED ORDER — FLUTICASONE FUROATE-VILANTEROL 100-25 MCG/INH IN AEPB: 1.0000 | INHALATION_SPRAY | Freq: Every day | RESPIRATORY_TRACT | Status: DC

## 2013-12-12 MED ORDER — FENTANYL CITRATE 0.05 MG/ML IJ SOLN
INTRAMUSCULAR | Status: AC
  Filled 2013-12-12: qty 5

## 2013-12-12 MED ORDER — MIDAZOLAM HCL 2 MG/2ML IJ SOLN
INTRAMUSCULAR | Status: AC
  Filled 2013-12-12: qty 2

## 2013-12-12 MED ORDER — PHENYLEPHRINE HCL 10 MG/ML IJ SOLN
0.0000 ug/min | INTRAVENOUS | Status: DC
  Administered 2013-12-12: 50 ug/min via INTRAVENOUS
  Filled 2013-12-12 (×2): qty 2

## 2013-12-12 MED ORDER — THROMBIN 20000 UNITS EX SOLR
CUTANEOUS | Status: AC
  Filled 2013-12-12: qty 20000

## 2013-12-12 MED ORDER — ALBUMIN HUMAN 5 % IV SOLN
250.0000 mL | INTRAVENOUS | Status: AC | PRN
  Administered 2013-12-12 (×4): 250 mL via INTRAVENOUS
  Filled 2013-12-12 (×2): qty 250

## 2013-12-12 MED ORDER — DOPAMINE-DEXTROSE 3.2-5 MG/ML-% IV SOLN
3.0000 ug/kg/min | INTRAVENOUS | Status: DC
  Administered 2013-12-12: 3 ug/kg/min via INTRAVENOUS
  Filled 2013-12-12: qty 250

## 2013-12-12 MED ORDER — DEXMEDETOMIDINE HCL IN NACL 400 MCG/100ML IV SOLN
0.4000 ug/kg/h | INTRAVENOUS | Status: DC
  Filled 2013-12-12: qty 100

## 2013-12-12 MED ORDER — BISACODYL 5 MG PO TBEC
10.0000 mg | DELAYED_RELEASE_TABLET | Freq: Every day | ORAL | Status: DC
  Administered 2013-12-13 – 2013-12-15 (×3): 10 mg via ORAL
  Filled 2013-12-12 (×4): qty 2

## 2013-12-12 MED ORDER — VANCOMYCIN HCL IN DEXTROSE 1-5 GM/200ML-% IV SOLN
1000.0000 mg | Freq: Once | INTRAVENOUS | Status: AC
  Administered 2013-12-12: 1000 mg via INTRAVENOUS
  Filled 2013-12-12: qty 200

## 2013-12-12 MED ORDER — SODIUM CHLORIDE 0.9 % IV SOLN
250.0000 mL | INTRAVENOUS | Status: DC
  Administered 2013-12-14: 500 mL via INTRAVENOUS

## 2013-12-12 MED ORDER — MILRINONE IN DEXTROSE 20 MG/100ML IV SOLN
0.3750 ug/kg/min | INTRAVENOUS | Status: DC
  Filled 2013-12-12: qty 100

## 2013-12-12 MED ORDER — SIMVASTATIN 20 MG PO TABS
20.0000 mg | ORAL_TABLET | Freq: Every day | ORAL | Status: DC
  Administered 2013-12-13 – 2013-12-23 (×11): 20 mg via ORAL
  Filled 2013-12-12 (×13): qty 1

## 2013-12-12 MED ORDER — SODIUM CHLORIDE 0.9 % IV SOLN
10.0000 g | INTRAVENOUS | Status: DC | PRN
  Administered 2013-12-12 (×2): 1 g via INTRAVENOUS
  Administered 2013-12-12: 5 g via INTRAVENOUS

## 2013-12-12 MED ORDER — CEFUROXIME SODIUM 1.5 G IJ SOLR
1.5000 g | Freq: Two times a day (BID) | INTRAMUSCULAR | Status: DC
  Filled 2013-12-12: qty 1.5

## 2013-12-12 MED ORDER — ACETAMINOPHEN 650 MG RE SUPP
650.0000 mg | Freq: Once | RECTAL | Status: AC
  Administered 2013-12-12: 650 mg via RECTAL

## 2013-12-12 MED ORDER — PANTOPRAZOLE SODIUM 40 MG PO TBEC: 40.0000 mg | DELAYED_RELEASE_TABLET | Freq: Every day | ORAL | Status: DC

## 2013-12-12 MED ORDER — BISACODYL 10 MG RE SUPP: 10.0000 mg | Freq: Every day | RECTAL | Status: DC

## 2013-12-12 MED ORDER — SODIUM CHLORIDE 0.9 % IV SOLN
INTRAVENOUS | Status: DC
  Administered 2013-12-12: 3.2 [IU]/h via INTRAVENOUS
  Filled 2013-12-12: qty 1

## 2013-12-12 MED ORDER — PROPOFOL 10 MG/ML IV BOLUS
INTRAVENOUS | Status: DC | PRN
  Administered 2013-12-12: 50 mg via INTRAVENOUS

## 2013-12-12 MED ORDER — ALBUTEROL SULFATE (2.5 MG/3ML) 0.083% IN NEBU
3.0000 mL | INHALATION_SOLUTION | Freq: Four times a day (QID) | RESPIRATORY_TRACT | Status: DC | PRN
Start: 2013-12-12 — End: 2013-12-24

## 2013-12-12 MED ORDER — FENTANYL CITRATE 0.05 MG/ML IJ SOLN
INTRAMUSCULAR | Status: DC | PRN
  Administered 2013-12-12: 250 ug via INTRAVENOUS
  Administered 2013-12-12: 150 ug via INTRAVENOUS
  Administered 2013-12-12: 100 ug via INTRAVENOUS
  Administered 2013-12-12: 150 ug via INTRAVENOUS
  Administered 2013-12-12: 250 ug via INTRAVENOUS
  Administered 2013-12-12: 150 ug via INTRAVENOUS
  Administered 2013-12-12: 100 ug via INTRAVENOUS
  Administered 2013-12-12 (×2): 50 ug via INTRAVENOUS

## 2013-12-12 MED ORDER — ONDANSETRON HCL 4 MG/2ML IJ SOLN
4.0000 mg | Freq: Four times a day (QID) | INTRAMUSCULAR | Status: DC | PRN
  Administered 2013-12-12 – 2013-12-13 (×3): 4 mg via INTRAVENOUS
  Filled 2013-12-12 (×3): qty 2

## 2013-12-12 MED ORDER — METOPROLOL TARTRATE 25 MG/10 ML ORAL SUSPENSION
12.5000 mg | Freq: Two times a day (BID) | ORAL | Status: DC
  Filled 2013-12-12 (×3): qty 5

## 2013-12-12 MED ORDER — PROPOFOL 10 MG/ML IV BOLUS
INTRAVENOUS | Status: AC
  Filled 2013-12-12: qty 20

## 2013-12-12 MED ORDER — MONTELUKAST SODIUM 10 MG PO TABS
10.0000 mg | ORAL_TABLET | Freq: Every day | ORAL | Status: DC
  Administered 2013-12-13 – 2013-12-23 (×11): 10 mg via ORAL
  Filled 2013-12-12 (×14): qty 1

## 2013-12-12 MED ORDER — POTASSIUM CHLORIDE 10 MEQ/50ML IV SOLN: 10.0000 meq | INTRAVENOUS | Status: AC

## 2013-12-12 MED ORDER — DEXMEDETOMIDINE HCL IN NACL 200 MCG/50ML IV SOLN: 0.1000 ug/kg/h | INTRAVENOUS | Status: DC

## 2013-12-12 MED ORDER — EPHEDRINE SULFATE 50 MG/ML IJ SOLN
INTRAMUSCULAR | Status: AC
  Filled 2013-12-12: qty 1

## 2013-12-12 MED ORDER — DOCUSATE SODIUM 100 MG PO CAPS
200.0000 mg | ORAL_CAPSULE | Freq: Every day | ORAL | Status: DC
  Administered 2013-12-13 – 2013-12-15 (×3): 200 mg via ORAL
  Filled 2013-12-12 (×4): qty 2

## 2013-12-12 MED ORDER — LACTATED RINGERS IV SOLN: 500.0000 mL | Freq: Once | INTRAVENOUS | Status: AC | PRN

## 2013-12-12 MED FILL — Heparin Sodium (Porcine) Inj 1000 Unit/ML: INTRAMUSCULAR | Qty: 10 | Status: AC

## 2013-12-12 MED FILL — Mannitol IV Soln 20%: INTRAVENOUS | Qty: 500 | Status: AC

## 2013-12-12 MED FILL — Electrolyte-R (PH 7.4) Solution: INTRAVENOUS | Qty: 6000 | Status: AC

## 2013-12-12 MED FILL — Lidocaine HCl IV Inj 20 MG/ML: INTRAVENOUS | Qty: 10 | Status: AC

## 2013-12-12 MED FILL — Sodium Bicarbonate IV Soln 8.4%: INTRAVENOUS | Qty: 50 | Status: AC

## 2013-12-12 MED FILL — Sodium Chloride IV Soln 0.9%: INTRAVENOUS | Qty: 2000 | Status: AC

## 2013-12-12 SURGICAL SUPPLY — 87 items
ADAPTER CARDIO PERF ANTE/RETRO (ADAPTER) ×3 IMPLANT
ANTEGRADE CPLG (MISCELLANEOUS) ×3 IMPLANT
ATTRACTOMAT 16X20 MAGNETIC DRP (DRAPES) ×3 IMPLANT
BAG DECANTER FOR FLEXI CONT (MISCELLANEOUS) ×3 IMPLANT
BLADE STERNUM SYSTEM 6 (BLADE) ×3 IMPLANT
BLADE SURG 15 STRL LF DISP TIS (BLADE) ×2 IMPLANT
BLADE SURG 15 STRL SS (BLADE) ×1
CANISTER SUCTION 2500CC (MISCELLANEOUS) ×3 IMPLANT
CANN PRFSN 3/8X14X24FR PCFC (MISCELLANEOUS) ×2
CANNULA ARTERIAL VENT 3/8 20FR (CANNULA) ×3 IMPLANT
CANNULA GUNDRY RCSP 15FR (MISCELLANEOUS) ×3 IMPLANT
CANNULA PRFSN 3/8X14X24FR PCFC (MISCELLANEOUS) ×2 IMPLANT
CANNULA VEN MTL TIP RT (MISCELLANEOUS) ×1
CANNULA VENOUS LOW PROF 34X46 (CANNULA) ×3 IMPLANT
CANNULA VRC MALB SNGL STG 36FR (MISCELLANEOUS) ×2 IMPLANT
CATH HEART VENT LEFT (CATHETERS) ×2 IMPLANT
CATH ROBINSON RED A/P 18FR (CATHETERS) ×12 IMPLANT
CATH THORACIC 36FR (CATHETERS) ×3 IMPLANT
CATH THORACIC 36FR RT ANG (CATHETERS) ×3 IMPLANT
CONN 1/2X1/2X1/2  BEN (MISCELLANEOUS) ×1
CONN 1/2X1/2X1/2 BEN (MISCELLANEOUS) ×2 IMPLANT
CONN 3/8X1/2 ST GISH (MISCELLANEOUS) ×6 IMPLANT
CONT SPEC 4OZ CLIKSEAL STRL BL (MISCELLANEOUS) ×6 IMPLANT
CONT SPEC STER OR (MISCELLANEOUS) IMPLANT
COVER SURGICAL LIGHT HANDLE (MISCELLANEOUS) ×6 IMPLANT
CRADLE DONUT ADULT HEAD (MISCELLANEOUS) ×3 IMPLANT
DRAPE CARDIOVASCULAR INCISE (DRAPES) ×1
DRAPE SLUSH/WARMER DISC (DRAPES) IMPLANT
DRAPE SRG 135X102X78XABS (DRAPES) ×2 IMPLANT
DRSG COVADERM 4X14 (GAUZE/BANDAGES/DRESSINGS) ×3 IMPLANT
ELECT CAUTERY BLADE 6.4 (BLADE) IMPLANT
ELECT REM PT RETURN 9FT ADLT (ELECTROSURGICAL) ×6
ELECTRODE REM PT RTRN 9FT ADLT (ELECTROSURGICAL) ×4 IMPLANT
GLOVE BIO SURGEON STRL SZ 6 (GLOVE) IMPLANT
GLOVE BIO SURGEON STRL SZ 6.5 (GLOVE) IMPLANT
GLOVE BIO SURGEON STRL SZ7 (GLOVE) IMPLANT
GLOVE BIO SURGEON STRL SZ7.5 (GLOVE) IMPLANT
GLOVE EUDERMIC 7 POWDERFREE (GLOVE) ×12 IMPLANT
GOWN STRL REUS W/ TWL LRG LVL3 (GOWN DISPOSABLE) ×8 IMPLANT
GOWN STRL REUS W/ TWL XL LVL3 (GOWN DISPOSABLE) ×2 IMPLANT
GOWN STRL REUS W/TWL LRG LVL3 (GOWN DISPOSABLE) ×4
GOWN STRL REUS W/TWL XL LVL3 (GOWN DISPOSABLE) ×1
HEART VENT LT CURVED (MISCELLANEOUS) ×3 IMPLANT
HEMOSTAT POWDER SURGIFOAM 1G (HEMOSTASIS) ×9 IMPLANT
HEMOSTAT SURGICEL 2X14 (HEMOSTASIS) ×3 IMPLANT
INSERT FOGARTY 61MM (MISCELLANEOUS) ×3 IMPLANT
KIT BASIN OR (CUSTOM PROCEDURE TRAY) ×3 IMPLANT
KIT CATH CPB BARTLE (MISCELLANEOUS) ×3 IMPLANT
KIT ROOM TURNOVER OR (KITS) ×3 IMPLANT
KIT SUCTION CATH 14FR (SUCTIONS) ×3 IMPLANT
LINE VENT (MISCELLANEOUS) ×3 IMPLANT
LOOP VESSEL SUPERMAXI WHITE (MISCELLANEOUS) ×3 IMPLANT
NS IRRIG 1000ML POUR BTL (IV SOLUTION) ×21 IMPLANT
PACK OPEN HEART (CUSTOM PROCEDURE TRAY) ×3 IMPLANT
PAD ARMBOARD 7.5X6 YLW CONV (MISCELLANEOUS) ×6 IMPLANT
SET CARDIOPLEGIA MPS 5001102 (MISCELLANEOUS) ×3 IMPLANT
SPONGE GAUZE 4X4 12PLY (GAUZE/BANDAGES/DRESSINGS) ×3 IMPLANT
SUCKER INTRACARDIAC WEIGHTED (SUCKER) ×3 IMPLANT
SUCKER WEIGHTED FLEX (MISCELLANEOUS) ×3 IMPLANT
SUT BONE WAX W31G (SUTURE) ×3 IMPLANT
SUT ETHIBON 2 0 V 52N 30 (SUTURE) ×6 IMPLANT
SUT ETHIBOND 2 0 SH (SUTURE) ×8 IMPLANT
SUT ETHIBOND 2 0 SH 36X2 (SUTURE) ×4 IMPLANT
SUT ETHIBOND 2 0 V4 (SUTURE) IMPLANT
SUT ETHIBOND 2 0V4 GREEN (SUTURE) IMPLANT
SUT PROLENE 3 0 SH DA (SUTURE) ×6 IMPLANT
SUT PROLENE 3 0 SH1 36 (SUTURE) ×3 IMPLANT
SUT PROLENE 4 0 RB 1 (SUTURE) ×6
SUT PROLENE 4-0 RB1 .5 CRCL 36 (SUTURE) ×12 IMPLANT
SUT STEEL 6MS V (SUTURE) IMPLANT
SUT VIC AB 1 CTX 36 (SUTURE) ×2
SUT VIC AB 1 CTX36XBRD ANBCTR (SUTURE) ×4 IMPLANT
SUTURE E-PAK OPEN HEART (SUTURE) ×3 IMPLANT
SYSTEM SAHARA CHEST DRAIN ATS (WOUND CARE) ×3 IMPLANT
TAPE 1/4INWX250INL WHITE (MISCELLANEOUS) ×1
TAPE CLOTH SURG 4X10 WHT LF (GAUZE/BANDAGES/DRESSINGS) ×3 IMPLANT
TAPE STRL ID 1/4INX250IN WHITE (MISCELLANEOUS) ×2 IMPLANT
TOWEL OR 17X24 6PK STRL BLUE (TOWEL DISPOSABLE) ×6 IMPLANT
TOWEL OR 17X26 10 PK STRL BLUE (TOWEL DISPOSABLE) ×6 IMPLANT
TRAY FOLEY IC TEMP SENS 14FR (CATHETERS) ×3 IMPLANT
TUBE SUCTION CARDIAC 10FR (CANNULA) IMPLANT
UNDERPAD 30X30 INCONTINENT (UNDERPADS AND DIAPERS) ×3 IMPLANT
VALVE MAGNA EASE 21MM (Prosthesis & Implant Heart) ×3 IMPLANT
VALVE MITRAL MAGNA 25 (Prosthesis & Implant Heart) ×3 IMPLANT
VENT LEFT HEART 12002 (CATHETERS) ×3
VRC MALLEABLE SINGLE STG 36FR (MISCELLANEOUS) ×3
WATER STERILE IRR 1000ML POUR (IV SOLUTION) ×6 IMPLANT

## 2013-12-12 NOTE — Anesthesia Procedure Notes (Addendum)
Procedure Name: Intubation Date/Time: 12/12/2013 7:49 AM Performed by: Maeola Harman Pre-anesthesia Checklist: Patient identified, Emergency Drugs available, Suction available, Patient being monitored and Timeout performed Patient Re-evaluated:Patient Re-evaluated prior to inductionOxygen Delivery Method: Circle system utilized Preoxygenation: Pre-oxygenation with 100% oxygen Intubation Type: IV induction Ventilation: Mask ventilation without difficulty Laryngoscope Size: Mac and 3 Grade View: Grade I Tube type: Oral Tube size: 8.0 mm Number of attempts: 1 Airway Equipment and Method: Stylet Placement Confirmation: ETT inserted through vocal cords under direct vision,  positive ETCO2 and breath sounds checked- equal and bilateral Secured at: 22 cm Tube secured with: Tape Dental Injury: Teeth and Oropharynx as per pre-operative assessment  Comments: Easy atraumatic induction and intubation with MAC 3 blade.  Dr. Orene Desanctis verified placement of ETT.  Diane Session,  CRNA

## 2013-12-12 NOTE — Progress Notes (Signed)
UR Completed.  [name] Jane [phone] 12/12/2013  

## 2013-12-12 NOTE — Interval H&P Note (Signed)
History and Physical Interval Note:  12/12/2013 7:36 AM  [name]  has presented today for surgery, with the diagnosis of AS MS  The various methods of treatment have been discussed with the patient and family. After consideration of risks, benefits and other options for treatment, the patient has consented to  Procedure(s): AORTIC VALVE REPLACEMENT (AVR) (N/A) MITRAL VALVE (MV) REPLACEMENT OR REPAIR (N/A) INTRAOPERATIVE TRANSESOPHAGEAL ECHOCARDIOGRAM (N/A) as a surgical intervention .  The patient's history has been reviewed, patient examined, no change in status, stable for surgery.  I have reviewed the patient's chart and labs.  Questions were answered to the patient's satisfaction.     Gaye Pollack

## 2013-12-12 NOTE — Procedures (Signed)
Extubation Procedure Note  Patient Details:   Name: [name] DOB: 12/28/1945 MRN: [id number]   Airway Documentation:  Airway 8 mm (Active)  Secured at (cm) 22 cm 12/12/2013  4:00 PM  Measured From Lips 12/12/2013  4:00 PM  Secured Location Right 12/12/2013  4:00 PM  Secured By Pink Tape 12/12/2013  4:00 PM  Site Condition Dry 12/12/2013  4:00 PM    Evaluation  O2 sats: stable throughout Complications: No apparent complications Patient did tolerate procedure well. Bilateral Breath Sounds: Clear   Yes  Mindi Slicker 12/12/2013, 10:32 PM

## 2013-12-12 NOTE — Preoperative (Signed)
Beta Blockers   Reason not to administer Beta Blockers:Not Applicable 

## 2013-12-12 NOTE — Transfer of Care (Signed)
Immediate Anesthesia Transfer of Care Note  Patient: [name]  Procedure(s) Performed: Procedure(s): AORTIC VALVE REPLACEMENT (AVR) (N/A) MITRAL VALVE (MV) REPLACEMENT OR REPAIR (N/A) INTRAOPERATIVE TRANSESOPHAGEAL ECHOCARDIOGRAM (N/A)  Patient Location: SICU  Anesthesia Type:General  Level of Consciousness: Patient remains intubated per anesthesia plan  Airway & Oxygen Therapy: Patient remains intubated per anesthesia plan  Post-op Assessment: Post -op Vital signs reviewed and stable  Post vital signs: stable  Complications: No apparent anesthesia complications

## 2013-12-12 NOTE — Anesthesia Preprocedure Evaluation (Addendum)
Anesthesia Evaluation  Patient identified by MRN, date of birth, ID band Patient awake    Reviewed: Allergy & Precautions, H&P , NPO status , Patient's Chart, lab work & pertinent test results, reviewed documented beta blocker date and time   History of Anesthesia Complications (+) PONV  Airway Mallampati: II TM Distance: >3 FB Neck ROM: Full    Dental  (+) Teeth Intact, Dental Advisory Given   Pulmonary shortness of breath, asthma , sleep apnea and Continuous Positive Airway Pressure Ventilation , pneumonia -, resolved, COPD COPD inhaler, former smoker,  breath sounds clear to auscultation        Cardiovascular hypertension, Pt. on medications +CHF + Valvular Problems/Murmurs MR Rhythm:Regular Rate:Normal     Neuro/Psych Anxiety Depression  Neuromuscular disease    GI/Hepatic GERD-  Medicated and Controlled,  Endo/Other    Renal/GU Renal InsufficiencyRenal disease     Musculoskeletal   Abdominal (+)  Abdomen: soft. Bowel sounds: normal.  Peds  Hematology  (+) anemia ,   Anesthesia Other Findings   Reproductive/Obstetrics                         Anesthesia Physical Anesthesia Plan  ASA: III  Anesthesia Plan: General   Post-op Pain Management:    Induction: Intravenous  Airway Management Planned: Oral ETT  Additional Equipment: Arterial line, PA Cath, 3D TEE and Ultrasound Guidance Line Placement  Intra-op Plan:   Post-operative Plan: Post-operative intubation/ventilation  Informed Consent: I have reviewed the patients History and Physical, chart, labs and discussed the procedure including the risks, benefits and alternatives for the proposed anesthesia with the patient or authorized representative who has indicated his/her understanding and acceptance.     Plan Discussed with: CRNA and Surgeon  Anesthesia Plan Comments:         Anesthesia Quick Evaluation

## 2013-12-12 NOTE — H&P (Signed)
[address]       [place] [address]             [phone]      Cardiothoracic Surgery History and Physical   Reason for Admission: Severe aortic stenosis and moderate mitral stenosis with acute diastolic congestive heart failure.   Referring Physician: Dr. Dani Gobble Croitoru   Asheley LEONI [name] is an 68 y.o. female.  HPI:  She is well-known to me from prior consultation in June 2014 for evaluation of symptomatic severe AS and moderate MS. 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 She was treated with chemotherapy and radiation therapy. She has been followed by Dr. Rollene Fare for her valvular heart disease and was seen back recently on 03/01/2013. Her last 2-D echocardiogram on 11/16/2012 was reviewed by me. There was a technically difficult study but showed severe aortic stenosis with a moderately calcified aortic annulus. There was moderate calcific mitral stenosis and the left ventricular ejection fraction was 55-60% with normal wall motion. It was not possible to see detail of the valve structure. Her main symptom is exertional dyspnea. She is a Psychologist, occupational in the surgical waiting room and says that if she has to make a couple of trips between there and the recovery room she gets very short of breath. She get short of breath with going up stairs. Surgical treatment was recommended but she wanted to wait until after May of this year due to an important social function that she was planning. Cardiac cath was planned for January but she developed pneumonia and it had to be postponed. She was admitted earlier this week with nausea, dry heaves and loose stools. She developed diaphoresis, dizziness, and may have  had brief syncopal episode while sitting on the toilet. Troponin was mildly elevated at 0.12. Her GI symptoms resolved quickly and C. Diff was negative. She had cath performed while she was here which showed no significant coronary disease, moderate mitral stenosis, severe AS, normal LV function, and mild pulmonary HTN. She was discharged home and felt well until that night when she developed acute shortness of breath not improved with her rescue inhaler. Her breathing continued to worsen overnight with decrease in her sats to 75-80% by the morning. Since admission and diuresis her breathing is back to baseline. TEE planned to assess mitral valve.  Past Medical History   Diagnosis  Date   .  IBS (irritable bowel syndrome)    .  Hyperlipidemia    .  Allergic rhinitis    .  History of pneumonia    .  COPD (chronic obstructive pulmonary disease)    .  Asthma    .  Heart murmur      Aortic   .  Lung nodule      RLL nodule-60mm stable 2006, April 2009, and June 2009   .  Candidiasis of breast    .  Anemia    .  Hyperlipemia    .  Obesity    .  Unspecified essential hypertension    .  Heart valve problem      PER CARDIOLOGIST 05-03-2011   .  Sleep difficulties    .  Hemorrhoids    .  Sleep apnea      PT TOLD BORDERLINE-TRIED CPAP-DID NOT HELP-SHE DOES NOT USE CPAP   .  Arthritis    .  Ileostomy care  09/01/2011   .  History of radiation therapy  05/30/11 to 07/07/11     rectum   .  Prophylactic chemotherapy      Will finish chemo on 03/04/12   .  Pneumonia      hx of several times   .  Shingles    .  rectal ca  04/2011   .  Severe aortic valve stenosis  10/01/2013   .  Shortness of breath    .  Rheumatic heart disease     Past Surgical History   Procedure  Laterality  Date   .  Foot surgery   left foot     hammer toe   .  Hand surgery   Bil     carpal tunnel   .  Back surgery       2006 SPACER---2007 LUMBAR FUSION   .  Tonsillectomy     .  Colon resection   08/25/2011      Procedure: COLON RESECTION LAPAROSCOPIC; Surgeon: Stark Klein, MD; Location: [facility]; Service: General; Laterality: N/A; Laparoscopic Assisted Low Anterior Resection Diverting Ostomy and onQ pain pump   .  Ileostomy   08/25/2011   .  Colon surgery     .  Ileostomy closure   04/02/2012     Procedure: ILEOSTOMY TAKEDOWN; Surgeon: Stark Klein, MD; Location: [facility]; Service: General; Laterality: N/A;   .  Bowel resection   04/02/2012     Procedure: SMALL BOWEL RESECTION; Surgeon: Stark Klein, MD; Location: [facility]; Service: General; Laterality: N/A;   .  Carpal tunnel release     .  Cardiac catheterization   11/27/2013    Family History   Problem  Relation  Age of Onset   .  Lung cancer  Father    .  Cancer  Father      lung   .  Diabetes     .  Heart disease     .  Heart disease  Mother    .  Diabetes  Mother    .  Hypertension  Mother    .  Colon cancer  Neg Hx    .  Esophageal cancer  Neg Hx    .  Stomach cancer  Neg Hx    .  Rectal cancer  Neg Hx    Social History: reports that she quit smoking about 38 years ago. Her smoking use included Cigarettes. She has a 15 pack-year smoking history. She has never used smokeless tobacco. She reports that she does not drink alcohol or use illicit drugs.  Works as a Psychologist, occupational at [employer] at [employer].  Allergies:  Allergies   Allergen  Reactions   .  Codeine  Nausea Only   .  Prednisone  Hives     In different places all over the body.   .  Sulfonamide Derivatives  Nausea Only   Medications:  I have reviewed the patient's current medications.  Prior to Admission:  Prescriptions prior to admission   Medication  Sig  Dispense  Refill   .  albuterol (PROVENTIL HFA;VENTOLIN HFA) 108 (90 BASE) MCG/ACT inhaler  Inhale 2 puffs into the lungs every 6 (six) hours as needed for wheezing.     Marland Kitchen  aspirin 81 MG tablet  Take 81  mg by mouth at bedtime.     .  Calcium Carbonate-Vitamin D (CALCIUM 600 + D PO)  Take 1,200 mg by mouth at  bedtime.     Marland Kitchen  diltiazem (CARDIZEM CD) 120 MG 24 hr capsule  Take 120 mg by mouth at bedtime.     .  Fluticasone Furoate-Vilanterol (BREO ELLIPTA) 100-25 MCG/INH AEPB  Inhale 1 puff into the lungs daily.  30 each  5   .  lovastatin (MEVACOR) 40 MG tablet  Take 40 mg by mouth at bedtime.     .  montelukast (SINGULAIR) 10 MG tablet  Take 10 mg by mouth at bedtime.     .  pantoprazole (PROTONIX) 40 MG tablet  Take 40 mg by mouth at bedtime.     Marland Kitchen  tiotropium (SPIRIVA) 18 MCG inhalation capsule  Place 18 mcg into inhaler and inhale daily.     Scheduled:  .  aspirin EC  81 mg  Oral  QHS   .  diltiazem  120 mg  Oral  QHS   .  furosemide  40 mg  Oral  Daily   .  heparin  5,000 Units  Subcutaneous  3 times per day   .  mometasone-formoterol  1 puff  Inhalation  BID   .  montelukast  10 mg  Oral  QHS   .  pantoprazole  40 mg  Oral  QHS   .  potassium chloride  20 mEq  Oral  Daily   .  simvastatin  10 mg  Oral  q1800   .  sodium chloride  3 mL  Intravenous  Q12H   .  sodium chloride  3 mL  Intravenous  Q12H   .  tiotropium  18 mcg  Inhalation  Daily   Continuous:  WUJ:WJXBJY chloride, sodium chloride, acetaminophen, albuterol, sodium chloride, sodium chloride  Results for orders placed during the hospital encounter of 11/28/13 (from the past 48 hour(s))   CBC Status: Abnormal    Collection Time    11/28/13 7:35 AM   Result  Value  Ref Range    WBC  12.7 (*)  4.0 - 10.5 K/uL    RBC  3.72 (*)  3.87 - 5.11 MIL/uL    Hemoglobin  11.8 (*)  12.0 - 15.0 g/dL    HCT  35.1 (*)  36.0 - 46.0 %    MCV  94.4  78.0 - 100.0 fL    MCH  31.7  26.0 - 34.0 pg    MCHC  33.6  30.0 - 36.0 g/dL    RDW  14.6  11.5 - 15.5 %    Platelets  126 (*)  150 - 400 K/uL   BASIC METABOLIC PANEL Status: Abnormal    Collection Time    11/28/13 7:35 AM   Result  Value  Ref Range    Sodium  139  137 - 147 mEq/L    Potassium  3.9  3.7 - 5.3 mEq/L    Chloride  102  96 - 112 mEq/L    CO2  23  19 - 32 mEq/L    Glucose, Bld   107 (*)  70 - 99 mg/dL    BUN  18  6 - 23 mg/dL    Creatinine, Ser  0.95  0.50 - 1.10 mg/dL    Calcium  9.2  8.4 - 10.5 mg/dL    GFR calc non Af Amer  60 (*)  >90 mL/min  GFR calc Af Amer  70 (*)  >90 mL/min    Comment:  (NOTE)     The eGFR has been calculated using the CKD EPI equation.     This calculation has not been validated in all clinical situations.     eGFR's persistently <90 mL/min signify possible Chronic Kidney     Disease.   PRO B NATRIURETIC PEPTIDE Status: Abnormal    Collection Time    11/28/13 7:35 AM   Result  Value  Ref Range    Pro B Natriuretic peptide (BNP)  3226.0 (*)  0 - 125 pg/mL   TROPONIN I Status: Abnormal    Collection Time    11/28/13 7:35 AM   Result  Value  Ref Range    Troponin I  0.50 (*)  <0.30 ng/mL    Comment:      Due to the release kinetics of cTnI,     a negative result within the first hours     of the onset of symptoms does not rule out     myocardial infarction with certainty.     If myocardial infarction is still suspected,     repeat the test at appropriate intervals.     CRITICAL RESULT CALLED TO, READ BACK BY AND VERIFIED WITH:     N.BULLOCK,RN [id number] 11/28/13 CLARK,S   I-STAT TROPOININ, ED Status: Abnormal    Collection Time    11/28/13 7:45 AM   Result  Value  Ref Range    Troponin i, poc  0.40 (*)  0.00 - 0.08 ng/mL    Comment  NOTIFIED PHYSICIAN     Comment 3      Comment:  Due to the release kinetics of cTnI,     a negative result within the first hours     of the onset of symptoms does not rule out     myocardial infarction with certainty.     If myocardial infarction is still suspected,     repeat the test at appropriate intervals.   TROPONIN I Status: Abnormal    Collection Time    11/28/13 12:23 PM   Result  Value  Ref Range    Troponin I  0.57 (*)  <0.30 ng/mL    Comment:      Due to the release kinetics of cTnI,     a negative result within the first hours     of the onset of symptoms does not rule out      myocardial infarction with certainty.     If myocardial infarction is still suspected,     repeat the test at appropriate intervals.     CRITICAL VALUE NOTED. VALUE IS CONSISTENT WITH PREVIOUSLY REPORTED AND CALLED VALUE.   MRSA PCR SCREENING Status: None    Collection Time    11/28/13 3:41 PM   Result  Value  Ref Range    MRSA by PCR  NEGATIVE  NEGATIVE    Comment:      The GeneXpert MRSA Assay (FDA     approved for NASAL specimens     only), is one component of a     comprehensive MRSA colonization     surveillance program. It is not     intended to diagnose MRSA     infection nor to guide or     monitor treatment for     MRSA infections.   CBC Status: Abnormal    Collection Time    11/28/13 4:12 PM  Result  Value  Ref Range    WBC  10.6 (*)  4.0 - 10.5 K/uL    RBC  3.56 (*)  3.87 - 5.11 MIL/uL    Hemoglobin  11.2 (*)  12.0 - 15.0 g/dL    HCT  33.5 (*)  36.0 - 46.0 %    MCV  94.1  78.0 - 100.0 fL    MCH  31.5  26.0 - 34.0 pg    MCHC  33.4  30.0 - 36.0 g/dL    RDW  14.8  11.5 - 15.5 %    Platelets  132 (*)  150 - 400 K/uL   CREATININE, SERUM Status: Abnormal    Collection Time    11/28/13 4:12 PM   Result  Value  Ref Range    Creatinine, Ser  0.93  0.50 - 1.10 mg/dL    GFR calc non Af Amer  62 (*)  >90 mL/min    GFR calc Af Amer  72 (*)  >90 mL/min    Comment:  (NOTE)     The eGFR has been calculated using the CKD EPI equation.     This calculation has not been validated in all clinical situations.     eGFR's persistently <90 mL/min signify possible Chronic Kidney     Disease.   TROPONIN I Status: Abnormal    Collection Time    11/28/13 6:34 PM   Result  Value  Ref Range    Troponin I  0.76 (*)  <0.30 ng/mL    Comment:      Due to the release kinetics of cTnI,     a negative result within the first hours     of the onset of symptoms does not rule out     myocardial infarction with certainty.     If myocardial infarction is still suspected,     repeat the test  at appropriate intervals.     CRITICAL VALUE NOTED. VALUE IS CONSISTENT WITH PREVIOUSLY REPORTED AND CALLED VALUE.   TROPONIN I Status: Abnormal    Collection Time    11/29/13 1:29 AM   Result  Value  Ref Range    Troponin I  0.78 (*)  <0.30 ng/mL    Comment:      Due to the release kinetics of cTnI,     a negative result within the first hours     of the onset of symptoms does not rule out     myocardial infarction with certainty.     If myocardial infarction is still suspected,     repeat the test at appropriate intervals.     CRITICAL VALUE NOTED. VALUE IS CONSISTENT WITH PREVIOUSLY REPORTED AND CALLED VALUE.   BASIC METABOLIC PANEL Status: Abnormal    Collection Time    11/29/13 1:29 AM   Result  Value  Ref Range    Sodium  140  137 - 147 mEq/L    Potassium  3.5 (*)  3.7 - 5.3 mEq/L    Chloride  100  96 - 112 mEq/L    CO2  27  19 - 32 mEq/L    Glucose, Bld  98  70 - 99 mg/dL    BUN  15  6 - 23 mg/dL    Creatinine, Ser  0.95  0.50 - 1.10 mg/dL    Calcium  8.9  8.4 - 10.5 mg/dL    GFR calc non Af Amer  60 (*)  >90 mL/min  GFR calc Af Amer  70 (*)  >90 mL/min    Comment:  (NOTE)     The eGFR has been calculated using the CKD EPI equation.     This calculation has not been validated in all clinical situations.     eGFR's persistently <90 mL/min signify possible Chronic Kidney     Disease.   Dg Chest Port 1 View  11/28/2013 CLINICAL DATA: Shortness of breath. EXAM: PORTABLE CHEST - 1 VIEW COMPARISON: PA and lateral chest 11/19/2013 and 10/22/2013. FINDINGS: Small right effusion and basilar airspace disease are again seen. The left lung is clear. Heart size is upper normal. No pneumothorax. IMPRESSION: No marked change in a small right effusion and basilar airspace disease. Electronically Signed By: Inge Rise [name] On: 11/28/2013 07:31  Review of Systems  Constitutional: Positive for malaise/fatigue and diaphoresis. Negative for fever, chills and weight loss.  HENT:  Negative.  Eyes: Negative.  Respiratory: Positive for shortness of breath. Negative for cough.  Cardiovascular: Positive for orthopnea and PND. Negative for chest pain, palpitations and leg swelling.  Gastrointestinal:  Nausea, dry heaves, and loose stools earlier in the week resolved.  Genitourinary: Negative.  Musculoskeletal: Negative.  Neurological: Positive for dizziness.  Brief syncope on the toilet.  Endo/Heme/Allergies: Negative.  Psychiatric/Behavioral: Negative.  Blood pressure 148/52, pulse 87, temperature 97.5 F (36.4 C), temperature source Oral, resp. rate 18, height 5\' 8"  (1.727 m), weight 104 kg (229 lb 4.5 oz), SpO2 99.00%.  Physical Exam  Constitutional: She is oriented to person, place, and time. She appears well-developed and well-nourished. No distress.  HENT:  Head: Normocephalic and atraumatic.  Mouth/Throat: Oropharynx is clear and moist.  Eyes: Conjunctivae and EOM are normal. Pupils are equal, round, and reactive to light.  Neck: Normal range of motion. Neck supple. No JVD present. No thyromegaly present.  Cardiovascular: Normal rate, regular rhythm and intact distal pulses.  Murmur heard. 3/6 systolic murmur over aorta  Respiratory: Effort normal and breath sounds normal. No respiratory distress. She has no wheezes. She has no rales. She exhibits no tenderness.  GI: Soft. Bowel sounds are normal. She exhibits no distension and no mass. There is no tenderness.  Musculoskeletal: Normal range of motion. She exhibits no edema.  Lymphadenopathy:  She has no cervical adenopathy.  Neurological: She is alert and oriented to person, place, and time. She has normal strength. No cranial nerve deficit or sensory deficit.  Skin: Skin is warm and dry.  Psychiatric: She has a normal mood and affect. Her behavior is normal. Judgment and thought content normal.   CARDIAC CATHETERIZATION REPORT    Procedures performed:  1. Left heart catheterization  2. Selective coronary  angiography  3. Left ventriculography  Reason for procedure:  Valvular heart disease  Syncope  Procedure performed by: Sanda Klein, MD, [employer]  Complications: none  Estimated blood loss: less than 5 mL  History: She was admitted with (near) syncope following vomiting and loose stools. She has recovered fully.  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 Her most recent echocardiogram showed severe aortic valve stenosis with mild aortic insufficiency. The estimated aortic valve area was around 0.7 cm square and the mean gradient was 40 mm Hg. In March of 2013 the mean aortic valve gradient was 51 mmHg (while anemic, secondary to colon cancer). The echo report also describes severe mitral stenosis, based on a mean gradient of 14 mm Hg, but I think this  was exaggerated by the heart rate of 100 bpm when the study was performed. No mention was made of MR, previously described as mild to moderate.Echocardiography has shown severe calcification in the aortic and mitral valve annuli as well as in the intervalvular fibrosa and Dr. Cyndia Bent suggested that a TEE would be helpful in planning her valve surgery. Left ventricular systolic function has been normal.  Consent: The risks, benefits, and details of the procedure were explained to the patient. Risks including death, MI, stroke, bleeding, limb ischemia, renal failure and allergy were described and accepted by the patient. Informed written consent was obtained prior to proceeding.  Technique: The patient was brought to the cardiac catheterization laboratory in the fasting state. He was prepped and draped in the usual sterile fashion. Local anesthesia with 1% lidocaine was administered to the right wrist and right groin area. Using the modified Seldinger technique a 7 French right common femoral vein sheath was introduced without difficulty. Attempts at radial artery cannulation were not successful and a  10F right common femoral artery sheath was placed. Right heart catheterization was performed with a 37F balloon tipped PA catheter. Cardiac output was calculated using both the Fick and the thermodilution methods. Under fluoroscopic guidance, using 5 [place] JL4, JR and angled pigtail catheters, selective cannulation of the left coronary artery, right coronary artery and left ventricle were respectively performed. A straight tipped guidewire and the JR catheter were used to cross the aortic valve,then exchanged over a long J-tipped guidewire for the pigtail catheter. Several coronary angiograms in a variety of projections were recorded, as well as a left ventriculogram in the RAO projection and an ascending aortogram in the LAO projection. Left ventricular pressure and a pull back to the aorta were recorded. No immediate complications occurred. At the end of the procedure, all catheters were removed. After the procedure, hemostasis will be achieved with manual pressure.  Contrast used: 120 mL Omnipaque  Hemodynamic findings:  Aortic pressure 113/65 (mean 87) mm Hg  Left ventricle 536/64 with end-diastolic pressure of 15 mm Hg  PA wedge pressure a wave 31, v wave 31 (mean 26) mm Hg  Pulmonary artery 49/24 (mean 34) mm Hg  Right ventricle 51/7 with an end-diastolic pressure of 9 mm Hg  Right atrium a wave 13, v wave 14 (mean 11) mm Hg  Cardiac output is 6.3 L per minute (cardiac index 2.9 L per minute per meter sq) - Fick  Cardiac output is 7.0 L per minute (cardiac index 3.2 L per minute per meter sq) - Thermodilution  Aortic valve gradients - peak to peak 74 mm Hg, , mean 48 mm Hg  Aortic valve area ([place]) - 0.7 cm sq using the Fick CO, 0.8 cm sq using TD (index 0.3-0.35 cm sq/m sq BSA)  Mitral valve mean gradient (HR 90 bpm) - 11 mm Hg  Mitral valve area 1.8 cm sq  Angiographic Findings:  All her coronaries are very large in caliber.  1. The left main coronary artery is free of significant  atherosclerosis and bifurcates in the usual fashion into the left anterior descending artery and left circumflex coronary artery.  2. The left anterior descending artery is a large vessel that reaches the apex and generates two major diagonal branches. There is evidence of minimal luminal irregularities and no calcification. No hemodynamically meaningful stenoses are seen.  3. The left circumflex coronary artery is a large-size vessel non dominant vessel that generates three major oblique marginal arteries. There is evidence  of minimal luminal irregularities and no calcification. There is a 30% smooth stenosis in the distal vessel. No other hemodynamically meaningful stenoses are seen.  4. The right coronary artery is a very large-size dominant vessel that generates a long posterior lateral ventricular system as well as the PDA. There is evidence of minimal luminal irregularities and no calcification. No hemodynamically meaningful stenoses are seen.  5. The left ventricle is normal in size. The left ventricle systolic function is hyperdynamic with an estimated ejection fraction of >70%. Regional wall motion abnormalities are not seen. No left ventricular thrombus is seen. There is probably mild mitral insufficiency. The ascending aorta appears minimally dilated. Heavily calcified aortic valve leaflets with restricted mobility are seen by fluoroscopy. The mitral annulus is also heavily calcified. There is severe aortic valve stenosis by pullback. The left ventricular end-diastolic pressure is 15 mm Hg.    IMPRESSIONS:  No significant CAD.  Hyperdynamic left ventricular function.  Severe aortic stenosis.  Moderate mitral valve stenosis.  Mild pulmonary artery hypertension.  RECOMMENDATION:  TEE to clarify anatomy of aortic and mitral intervalvular fibrosa calcification.  Refer back to Dr,. Bartle to discuss AVR/MVR. Although MS is not critical, MV replacement may also be necessary due to the extent of  heavy calcification between the two valves.       Sanda Klein, MD, [facility]  [facility]  [phone] office  [phone] pager       [facility]* [address], [place] [address] [phone]  ------------------------------------------------------------ Transesophageal Echocardiography  (Report amended )  Patient: [name], [place] MR #: [id number] Study Date: 11/29/2013 Gender: F Age: 78 Height: 172.7cm Weight: 104.1kg BSA: 2.22m^2 Pt. Status: Room: [place], MD SONOGRAPHER Florentina Jenny, RDCS ORDERING Lissa Hoard ADMITTING Croitoru, Mihai ATTENDING Croitoru, Mihai cc:  ------------------------------------------------------------ LV EF: 60% - 65%  ------------------------------------------------------------ Indications: Aortic stenosis 424.1.  ------------------------------------------------------------ History: PMH: Mitral valve disease.  ------------------------------------------------------------ Study Conclusions  - Left ventricle: Systolic function was normal. The estimated ejection fraction was in the range of 60% to 65%. - Aortic valve: planimetered AVA measured from 0.5 to 0.7cm2 consistent with severe AS Severe diffuse thickening and calcification. There was severe stenosis. Mild regurgitation directed eccentrically in the LVOT. - Mitral valve: There posterior MV leaflet and annulus are heavily calcified and are fixed. The anterior mitral valve leaflet is mildly thickened. The entire MV annulus is heavily calcified. The mean gradient ranged from 9-56mmHg. The findings are consistent with severe stenosis. Mild regurgitation. - Left atrium: The atrium was severely dilated. No evidence of thrombus in the atrial cavity or appendage. No evidence of thrombus in the appendage. There was spontaneous echo contrast ("smoke"). - Right atrium: The atrium  was moderately dilated. - Atrial septum: No defect or patent foramen ovale was identified. - Tricuspid valve: No evidence of vegetation. - Pulmonic valve: No evidence of vegetation. - Superior vena cava: The study excluded a thrombus. Transesophageal echocardiography. 2D and color Doppler. Height: Height: 172.7cm. Height: 68in. Weight: Weight: 104.1kg. Weight: 229lb. Body mass index: BMI: 34.9kg/m^2. Body surface area: BSA: 2.57m^2. Blood pressure: 150/60. Patient status: Inpatient. Location: Endoscopy.  ------------------------------------------------------------  ------------------------------------------------------------ Left ventricle: Systolic function was normal. The estimated ejection fraction was in the range of 60% to 65%.  ------------------------------------------------------------ Aortic valve: planimetered AVA measured from 0.5 to 0.7cm2 consistent with severe AS Severe diffuse thickening and calcification. Doppler: There was severe stenosis. Mild regurgitation directed eccentrically in the LVOT. Planimetered valve area:  0.51cm^2. Indexed valve area by planimetry: 0.22cm^2/m^2.  ------------------------------------------------------------ Aorta: The aorta was normal, not dilated, and non-diseased.  ------------------------------------------------------------ Mitral valve: There posterior MV leaflet and annulus are heavily calcified and are fixed. The anterior mitral valve leaflet is mildly thickened. The entire MV annulus is heavily calcified. The mean gradient ranged from 9-49mmHg. Doppler: The findings are consistent with severe stenosis. Mild regurgitation. Mean gradient: 66mm Hg (D). Peak gradient: 54mm Hg (D).  ------------------------------------------------------------ Left atrium: The atrium was severely dilated. No evidence of thrombus in the atrial cavity or appendage. No evidence of thrombus in the appendage. There was spontaneous echo contrast  ("smoke"). The appendage was of normal size. Emptying velocity was normal.  ------------------------------------------------------------ Atrial septum: marked lipomatous hypertrophy of the interatrial septum No defect or patent foramen ovale was identified.  ------------------------------------------------------------ Pulmonic valve: Structurally normal valve. Cusp separation was normal. No evidence of vegetation. Doppler: Trivial regurgitation.  ------------------------------------------------------------ Tricuspid valve: Structurally normal valve. Leaflet separation was normal. No evidence of vegetation. Doppler: No regurgitation.  ------------------------------------------------------------ Right atrium: The atrium was moderately dilated.  ------------------------------------------------------------ Pericardium: The pericardium was normal in appearance. There was no pericardial effusion.  ------------------------------------------------------------ Systemic veins: Superior vena cava: The study excluded a thrombus.  ------------------------------------------------------------ Post procedure conclusions Ascending Aorta:  - The aorta was normal, not dilated, and non-diseased.  ------------------------------------------------------------  2D measurements Normal Doppler measurements Normal Aortic valve Mitral valve Area 0.51 cm^2 ------ Mean vel, D 156 cm/s ------ (plan) Mean 11 mm ------ Area 0.22 cm^2/m^2 ------ gradient, D Hg index Peak 21 mm ------ (plan) gradient, D Hg Annulus VTI 45.6 cm ------  ------------------------------------------------------------ Michaelle Birks, MD [id number]         Assessment/Plan:  She has severe aortic stenosis and moderate mitral stenosis by prior 2-D echo and cath with extensive calcification of the mitral annulus and intervalvular region. She has had stable exertional dyspnea until this acute episode  post-cath which is probably acute diastolic heart failure related to the cath. She improved quickly with diuresis. She will require AVR and MVR. I would plan to use tissue valves given her age of 64 and history of rectal cancer, IBS, COPD. I discussed the operative procedure with the patient including alternatives, benefits and risks; including but not limited to bleeding, blood transfusion, infection, stroke, myocardial infarction, graft failure, heart block requiring a permanent pacemaker, organ dysfunction, and death.  [place] understands and agrees to proceed.

## 2013-12-12 NOTE — Progress Notes (Addendum)
S/p AVR/ MVR  Intubated, sedated  BP 86/38  Pulse 91  Temp(Src) 96.4 F (35.8 C) (Core (Comment))  Resp 14  Ht 5\' 4"  (1.626 m)  Wt 213 lb 13.5 oz (97 kg)  SpO2 100%  CO= 5.4   Intake/Output Summary (Last 24 hours) at 12/12/13 1838 Last data filed at 12/12/13 1800  Gross per 24 hour  Intake 3664.73 ml  Output   3559 ml  Net 105.73 ml   Hct = 30  Doing well early postop

## 2013-12-12 NOTE — Brief Op Note (Addendum)
12/12/2013  1:27 PM  PATIENT:  [name]  68 y.o. female  PRE-OPERATIVE DIAGNOSIS:  AS MS  POST-OPERATIVE DIAGNOSIS:  AS, MS  PROCEDURE:  Procedure(s):  AORTIC VALVE REPLACEMENT  -21 mm [employer] Ease Pericardial Tissue Valve  MITRAL VALVE (MV) REPLACEMENT  -25 mm [employer] Mitral Ease Pericardial Tissue Valve  INTRAOPERATIVE TRANSESOPHAGEAL ECHOCARDIOGRAM (N/A)  SURGEON:  Surgeon(s) and Role:    * Gaye Pollack, MD - Primary  PHYSICIAN ASSISTANT: Erin Barrett PA-C  ANESTHESIA:   general  EBL:  Total I/O In: 1800 [I.V.:1800] Out: 1610 [Urine:1205]  BLOOD ADMINISTERED: CELLSAVER and  PLTS  DRAINS: Mediastinal chest drains   LOCAL MEDICATIONS USED:  NONE  SPECIMEN:  Source of Specimen:  Aortic Valve Leaflets, Anterior Leaflet Mitral Valve  DISPOSITION OF SPECIMEN:  PATHOLOGY  COUNTS:  YES  TOURNIQUET:  * No tourniquets in log *  DICTATION: .Dragon Dictation  PLAN OF CARE: Admit to inpatient   PATIENT DISPOSITION:  ICU - intubated and hemodynamically stable.   Delay start of Pharmacological VTE agent (>24hrs) due to surgical blood loss or risk of bleeding: yes   Aortic Valve Etiology   Aortic Insufficiency:  Mild  Aortic Valve Disease:  Yes.  Aortic Stenosis:  Yes. Smallest Aortic Valve Area: 0.51cm2; Highest Mean Gradient: 42 mmHg.  Etiology (Choose at least one and up to  5 etiologies):  Degenerative - Calcified   Aortic Valve  Procedure Performed:  Replacement: Yes.  Bioprosthetic Valve. Implant Model Number:[id number], Size:21, Unique Device Identifier:[id number]  Repair/Reconstruction: No.   Aortic Annular Enlargement: No.     Mitral Valve Etiology  MV Insufficiency: Mild  MV Disease: Yes.  MV Stenosis: Yes. Smallest Valve Area: 0.47 cm2. Highest Mean Gradient: 25mmHg.  MV Disease Functional Class: MV Disease Functional Class: Type IIIa.   Etiology (Choose at least one and up to five):  Degenerative.  MV Lesions (Choose at least one): Leaflet calcification.  and Mitral annular calcification.    Mitral/Tricuspid/Pulmonary Valve Procedure  Mitral Valve Procedure Performed:  Replacement:  Implant: Bioprosthetic Valve: Implant model number [id number], Size 25, Unique Device Identifier [id number].  Tricuspid Valve Procedure Performed:  N/A  Implant: N/A  Pulmonary Valve Procedure Performed: N/A  Implant: N/A

## 2013-12-13 ENCOUNTER — Encounter (HOSPITAL_COMMUNITY): Payer: Self-pay | Admitting: Anesthesiology

## 2013-12-13 ENCOUNTER — Inpatient Hospital Stay (HOSPITAL_COMMUNITY): Payer: Medicare Other

## 2013-12-13 LAB — BASIC METABOLIC PANEL
BUN: 18 mg/dL (ref 6–23)
CO2: 24 mEq/L (ref 19–32)
Calcium: 7.9 mg/dL — ABNORMAL LOW (ref 8.4–10.5)
Chloride: 106 mEq/L (ref 96–112)
Creatinine, Ser: 0.86 mg/dL (ref 0.50–1.10)
GFR calc Af Amer: 79 mL/min — ABNORMAL LOW (ref >90)
GFR calc non Af Amer: 68 mL/min — ABNORMAL LOW (ref >90)
Glucose, Bld: 108 mg/dL — ABNORMAL HIGH (ref 70–99)
Potassium: 4.4 mEq/L (ref 3.7–5.3)
Sodium: 140 mEq/L (ref 137–147)

## 2013-12-13 LAB — POCT I-STAT, CHEM 8
BUN: 18 mg/dL (ref 6–23)
Calcium, Ion: 1.24 mmol/L (ref 1.13–1.30)
Chloride: 103 mEq/L (ref 96–112)
Creatinine, Ser: 1.1 mg/dL (ref 0.50–1.10)
Glucose, Bld: 112 mg/dL — ABNORMAL HIGH (ref 70–99)
HCT: 30 % — ABNORMAL LOW (ref 36.0–46.0)
Hemoglobin: 10.2 g/dL — ABNORMAL LOW (ref 12.0–15.0)
Potassium: 3.9 mEq/L (ref 3.7–5.3)
Sodium: 137 mEq/L (ref 137–147)
TCO2: 25 mmol/L (ref 0–100)

## 2013-12-13 LAB — GLUCOSE, CAPILLARY
Glucose-Capillary: 100 mg/dL — ABNORMAL HIGH (ref 70–99)
Glucose-Capillary: 101 mg/dL — ABNORMAL HIGH (ref 70–99)
Glucose-Capillary: 101 mg/dL — ABNORMAL HIGH (ref 70–99)
Glucose-Capillary: 101 mg/dL — ABNORMAL HIGH (ref 70–99)
Glucose-Capillary: 101 mg/dL — ABNORMAL HIGH (ref 70–99)
Glucose-Capillary: 103 mg/dL — ABNORMAL HIGH (ref 70–99)
Glucose-Capillary: 105 mg/dL — ABNORMAL HIGH (ref 70–99)
Glucose-Capillary: 105 mg/dL — ABNORMAL HIGH (ref 70–99)
Glucose-Capillary: 106 mg/dL — ABNORMAL HIGH (ref 70–99)
Glucose-Capillary: 106 mg/dL — ABNORMAL HIGH (ref 70–99)
Glucose-Capillary: 107 mg/dL — ABNORMAL HIGH (ref 70–99)
Glucose-Capillary: 107 mg/dL — ABNORMAL HIGH (ref 70–99)
Glucose-Capillary: 109 mg/dL — ABNORMAL HIGH (ref 70–99)
Glucose-Capillary: 114 mg/dL — ABNORMAL HIGH (ref 70–99)
Glucose-Capillary: 114 mg/dL — ABNORMAL HIGH (ref 70–99)
Glucose-Capillary: 121 mg/dL — ABNORMAL HIGH (ref 70–99)
Glucose-Capillary: 122 mg/dL — ABNORMAL HIGH (ref 70–99)
Glucose-Capillary: 122 mg/dL — ABNORMAL HIGH (ref 70–99)
Glucose-Capillary: 130 mg/dL — ABNORMAL HIGH (ref 70–99)
Glucose-Capillary: 142 mg/dL — ABNORMAL HIGH (ref 70–99)
Glucose-Capillary: 152 mg/dL — ABNORMAL HIGH (ref 70–99)
Glucose-Capillary: 94 mg/dL (ref 70–99)
Glucose-Capillary: 94 mg/dL (ref 70–99)
Glucose-Capillary: 97 mg/dL (ref 70–99)

## 2013-12-13 LAB — CBC
HCT: 26.5 % — ABNORMAL LOW (ref 36.0–46.0)
HCT: 30.3 % — ABNORMAL LOW (ref 36.0–46.0)
HCT: 30.8 % — ABNORMAL LOW (ref 36.0–46.0)
Hemoglobin: 8.8 g/dL — ABNORMAL LOW (ref 12.0–15.0)
Hemoglobin: 10 g/dL — ABNORMAL LOW (ref 12.0–15.0)
Hemoglobin: 10.3 g/dL — ABNORMAL LOW (ref 12.0–15.0)
MCH: 30.4 pg (ref 26.0–34.0)
MCH: 30.3 pg (ref 26.0–34.0)
MCH: 31 pg (ref 26.0–34.0)
MCHC: 33.2 g/dL (ref 30.0–36.0)
MCHC: 33 g/dL (ref 30.0–36.0)
MCHC: 33.4 g/dL (ref 30.0–36.0)
MCV: 91.7 fL (ref 78.0–100.0)
MCV: 91.8 fL (ref 78.0–100.0)
MCV: 92.8 fL (ref 78.0–100.0)
Platelets: 120 10*3/uL — ABNORMAL LOW (ref 150–400)
Platelets: 129 10*3/uL — ABNORMAL LOW (ref 150–400)
Platelets: 71 10*3/uL — ABNORMAL LOW (ref 150–400)
RBC: 2.89 MIL/uL — ABNORMAL LOW (ref 3.87–5.11)
RBC: 3.3 MIL/uL — ABNORMAL LOW (ref 3.87–5.11)
RBC: 3.32 MIL/uL — ABNORMAL LOW (ref 3.87–5.11)
RDW: 14.5 % (ref 11.5–15.5)
RDW: 15.4 % (ref 11.5–15.5)
RDW: 15.7 % — ABNORMAL HIGH (ref 11.5–15.5)
WBC: 16.7 10*3/uL — ABNORMAL HIGH (ref 4.0–10.5)
WBC: 20.1 10*3/uL — ABNORMAL HIGH (ref 4.0–10.5)
WBC: 15.9 10*3/uL — ABNORMAL HIGH (ref 4.0–10.5)

## 2013-12-13 LAB — POCT I-STAT 3, ART BLOOD GAS (G3+)
Acid-base deficit: 2 mmol/L (ref 0.0–2.0)
Bicarbonate: 24.8 mEq/L — ABNORMAL HIGH (ref 20.0–24.0)
O2 Saturation: 96 %
Patient temperature: 37.9
TCO2: 26 mmol/L (ref 0–100)
pCO2 arterial: 54 mmHg — ABNORMAL HIGH (ref 35.0–45.0)
pH, Arterial: 7.274 — ABNORMAL LOW (ref 7.350–7.450)
pO2, Arterial: 102 mmHg — ABNORMAL HIGH (ref 80.0–100.0)

## 2013-12-13 LAB — MAGNESIUM
Magnesium: 2.7 mg/dL — ABNORMAL HIGH (ref 1.5–2.5)
Magnesium: 2.5 mg/dL (ref 1.5–2.5)

## 2013-12-13 LAB — PREPARE PLATELET PHERESIS: Unit division: 0

## 2013-12-13 LAB — CREATININE, SERUM
Creatinine, Ser: 0.93 mg/dL (ref 0.50–1.10)
GFR calc Af Amer: 72 mL/min — ABNORMAL LOW (ref >90)
GFR calc non Af Amer: 62 mL/min — ABNORMAL LOW (ref >90)

## 2013-12-13 MED ORDER — OXYCODONE HCL 5 MG PO TABS
10.0000 mg | ORAL_TABLET | ORAL | Status: DC | PRN
  Administered 2013-12-13: 10 mg via ORAL
  Filled 2013-12-13: qty 2

## 2013-12-13 MED ORDER — ASPIRIN 81 MG PO CHEW: 81.0000 mg | CHEWABLE_TABLET | Freq: Every day | ORAL | Status: DC

## 2013-12-13 MED ORDER — INSULIN DETEMIR 100 UNIT/ML ~~LOC~~ SOLN
10.0000 [IU] | Freq: Once | SUBCUTANEOUS | Status: AC
  Administered 2013-12-13: 10 [IU] via SUBCUTANEOUS
  Filled 2013-12-13: qty 0.1

## 2013-12-13 MED ORDER — FUROSEMIDE 10 MG/ML IJ SOLN
40.0000 mg | Freq: Two times a day (BID) | INTRAMUSCULAR | Status: DC
  Administered 2013-12-13 (×2): 40 mg via INTRAVENOUS
  Filled 2013-12-13 (×2): qty 4

## 2013-12-13 MED ORDER — ENOXAPARIN SODIUM 40 MG/0.4ML ~~LOC~~ SOLN
40.0000 mg | Freq: Every day | SUBCUTANEOUS | Status: DC
  Administered 2013-12-16 – 2013-12-22 (×7): 40 mg via SUBCUTANEOUS
  Filled 2013-12-13 (×12): qty 0.4

## 2013-12-13 MED ORDER — WARFARIN - PHYSICIAN DOSING INPATIENT
Freq: Every day | Status: DC
  Administered 2013-12-13 – 2013-12-16 (×4)
  Administered 2013-12-17: 1
  Administered 2013-12-18 – 2013-12-23 (×3)

## 2013-12-13 MED ORDER — METOCLOPRAMIDE HCL 10 MG PO TABS
10.0000 mg | ORAL_TABLET | Freq: Three times a day (TID) | ORAL | Status: AC
  Administered 2013-12-13 – 2013-12-15 (×7): 10 mg via ORAL
  Filled 2013-12-13 (×12): qty 1

## 2013-12-13 MED ORDER — PROMETHAZINE HCL 25 MG/ML IJ SOLN
12.5000 mg | Freq: Four times a day (QID) | INTRAMUSCULAR | Status: DC | PRN
  Administered 2013-12-13 – 2013-12-14 (×3): 12.5 mg via INTRAVENOUS
  Filled 2013-12-13 (×3): qty 1

## 2013-12-13 MED ORDER — INSULIN ASPART 100 UNIT/ML ~~LOC~~ SOLN: 0.0000 [IU] | SUBCUTANEOUS | Status: DC

## 2013-12-13 MED ORDER — ASPIRIN EC 81 MG PO TBEC
81.0000 mg | DELAYED_RELEASE_TABLET | Freq: Every day | ORAL | Status: DC
  Administered 2013-12-14 – 2013-12-15 (×2): 81 mg via ORAL
  Filled 2013-12-13 (×3): qty 1

## 2013-12-13 MED ORDER — WARFARIN SODIUM 2.5 MG PO TABS
2.5000 mg | ORAL_TABLET | Freq: Once | ORAL | Status: AC
  Administered 2013-12-13: 2.5 mg via ORAL
  Filled 2013-12-13: qty 1

## 2013-12-13 MED ORDER — ALBUMIN HUMAN 5 % IV SOLN
12.5000 g | Freq: Once | INTRAVENOUS | Status: AC
  Administered 2013-12-13: 12.5 g via INTRAVENOUS
  Filled 2013-12-13: qty 250

## 2013-12-13 MED ORDER — TRAMADOL HCL 50 MG PO TABS: 50.0000 mg | ORAL_TABLET | Freq: Four times a day (QID) | ORAL | Status: DC | PRN

## 2013-12-13 MED ORDER — PHENYLEPHRINE HCL 10 MG/ML IJ SOLN
0.0000 ug/min | INTRAVENOUS | Status: DC
  Administered 2013-12-13: 80 ug/min via INTRAVENOUS
  Filled 2013-12-13: qty 4

## 2013-12-13 MED FILL — Magnesium Sulfate Inj 50%: INTRAMUSCULAR | Qty: 10 | Status: AC

## 2013-12-13 MED FILL — Potassium Chloride Inj 2 mEq/ML: INTRAVENOUS | Qty: 40 | Status: AC

## 2013-12-13 MED FILL — Heparin Sodium (Porcine) Inj 1000 Unit/ML: INTRAMUSCULAR | Qty: 30 | Status: AC

## 2013-12-13 NOTE — Op Note (Signed)
[name], [name] NO.:  [id number]  MEDICAL RECORD NO.:  [id number]  LOCATION:  2S01C                        FACILITY:  [facility]  PHYSICIAN:  Ala Dach, M.D.DATE OF BIRTH:  Jan 03, 1946  DATE OF PROCEDURE:  12/12/2013 DATE OF DISCHARGE:                              OPERATIVE REPORT   Intraoperative Transesophageal Echocardiographic Report  INDICATION FOR PROCEDURE:  [name] is a 68 year old female who presents today for aortic valve replacement and possible mitral valve repair or replacement to be performed by Dr. Gilford Raid.  She is brought to the holding area in the morning of surgery where under local anesthesia with sedation, pulmonary artery and radial arterial catheters were placed.  She has been transferred to the OR for routine induction of general anesthesia after which the TEE probe was prepared, then passed oropharyngeal into the stomach before slightly withdrawing for imaging of the cardiac structures.  PRECARDIOPULMONARY BYPASS TEE EXAMINATION:  Left ventricle:  There is significant concentric pattern of left ventricular hypertrophy noted in her short-axis view of the left ventricular chamber.  There is near collapse of the left ventricular chamber during systole and near obliteration of the chamber sizes self during systole.  Papillary muscles contact during the systolic contraction.  The walls of the ventricle are thickened concentrically.  They area nearly 2 cm, 20 mm in diameter.  Long-axis views are also obtained.  Mitral valve:  The mitral valve is seen initially in the four-chamber view.  There is heavy mitral annular calcification appreciated.  There is a heavy calcium and a nearly fixed posterior leaflet of the mitral valve apparatus.  Overall, the whole inflow area of the mitral valve seems narrowed at the orifice associated with diminished opening of the leaflets themselves suggestive of a pattern of mitral  stenosis. Continuous-wave Doppler across the mitral valve and a pressure half time calculation reveals a 1-1.25 cm2 area of the mitral inflow area.  This is also associated with turbulent flow on color Doppler examination. Again multiple views of the mitral valve are carried out consistent with mitral narrowing and near mitral stenosis.  Aortic valve:  The aortic valve is seen in the short-axis view.  It is trileaflet.  However, there is severe aortic stenosis appreciated with significant limited opening of these aortic valve leaflets.  Planimetry examination reveals only about a 0.5-0.7 cm2 opening of the aortic valve consistent with severe aortic stenosis.  Both color Doppler also reveals a mild central jet, but more importantly, a significant stenotic jet well into the ascending aorta.  Left atrium:  The left atrial chamber is enlarged and dilated.  On pulse- wave Doppler across this into the pulmonary vein, there is a blunted pulmonary weight pattern.  The interatrial septum is interrogated and noted to have lipomatous changes.  No patent foramen ovale is detected. The left atrial appendage is interrogated in clear thrombus.  Right ventricle:  The right ventricular chamber is normal right ventricular upper chamber overall with normal contractility appreciated.  Tricuspid valve:  The tricuspid valve has thin compliant mobile leaflets.  This is associated with trivial-to-mild tricuspid regurgitant jet.  Right atrium:  Normal right atrial chamber is appreciated.  The patient  was placed on cardiopulmonary bypass.  The operation is a mitral valve repair with a low-profile bioprosthetic valve. Subsequently, a diseased aortic valve was excised and replaced with a bioprosthetic valve as well.  The airing maneuvers were carried out. The patient has rewarmed and separated from cardiopulmonary bypass with the initial attempt.  POSTCARDIOPULMONARY BYPASS TEE EXAMINATION:  Mitral valve:   In the mitral valve area, we can see the ring apparatus in the leaflets themselves of this new bioprosthetic valve that is seated slightly eccentrically due to the heavy calcium on the posterior area of the left atrium.  The valve itself however is stationary.  The leaflets appear to open satisfactorily during diastolic inflow.  On closure, there is no  mitral regurgitation appreciated.  Overall, this appears to be satisfactory replacement of the mitral valve.  Aortic valve:  In place of the diseased aortic valve, could not be seen a trileaflet bioprosthetic valve in place.  It is normally seated and normally functioning.  Color Doppler across the valve and long-axis views again revealed no aortic insufficiency and no aortic stenosis. Again, this appears to be a satisfactory repair.  Left ventricle:  The left ventricular chamber is viewed in the early bypass.  It is thickened though it is contractile.  All segmental wall areas appear contractile.  Overall, no significant changes are appreciated.  The patient is returned to the cardiac intensive care unit in stable condition.          ______________________________ Ala Dach, [name]     JTM/MEDQ  D:  12/12/2013  T:  12/13/2013  Job:  [id number]

## 2013-12-13 NOTE — Op Note (Signed)
CARDIOVASCULAR SURGERY OPERATIVE NOTE  12/12/2013 [name] [id number]  Surgeon:  Gaye Pollack, MD  First Assistant: Ellwood Handler, PA-C   Preoperative Diagnosis:  1. Severe aortic stenosis                                              2. Severe mitral stenosis  Postoperative Diagnosis:  Same   Procedure:  1. Median Sternotomy 2. Extracorporeal circulation 3.   Aortic valve replacement using a 21 mm Edwards Magna-Ease Pericardial valve. 4.   Mitral valve replacement using a 25 mm Edwards Magna-Ease Pericardial valve.  Anesthesia:  General Endotracheal   Clinical History/Surgical Indication:  She is well-known to me from prior consultation in June 2014 for evaluation of symptomatic severe AS and moderate MS. 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 She was treated with chemotherapy and radiation therapy. She has been followed by Dr. Rollene Fare for her valvular heart disease and was seen back recently on 03/01/2013. Her last 2-D echocardiogram on 11/16/2012 was reviewed by me. There was a technically difficult study but showed severe aortic stenosis with a moderately calcified aortic annulus. There was moderate calcific mitral stenosis and the left ventricular ejection fraction was 55-60% with normal wall motion. It was not possible to see detail of the valve structure. Her main symptom is exertional dyspnea. She is a Psychologist, occupational in the surgical waiting room and says that if she has to make a couple of trips between there and the recovery room she gets very short of breath. She get short of breath with going up stairs. Surgical treatment was recommended but she wanted to wait until after May of this year due to an  important social function that she was planning. Cardiac cath was planned for January but she developed pneumonia and it had to be postponed. She was admitted earlier this week with nausea, dry heaves and loose stools. She developed diaphoresis, dizziness, and may have had brief syncopal episode while sitting on the toilet. Troponin was mildly elevated at 0.12. Her GI symptoms resolved quickly and C. Diff was negative. She had cath performed while she was here which showed no significant coronary disease, moderate mitral stenosis, severe AS, normal LV function, and mild pulmonary HTN. She was discharged home and felt well until that night when she developed acute shortness of breath not improved with her rescue inhaler. Her breathing continued to worsen overnight with decrease in her sats to 75-80% by the morning. She was diuresed with improvement. She had a TEE as noted below:  *[facility]* [address], [place] [address] [phone]  ------------------------------------------------------------ Transesophageal Echocardiography  (Report amended )  Patient: [name], [name] MR #: [id number] Study Date: 11/29/2013 Gender: F Age: 68 Height: 172.7cm Weight: 104.1kg BSA: 2.84m^2 Pt. Status: Room: [place], MD SONOGRAPHER Florentina Jenny, RDCS ORDERING Lissa Hoard ADMITTING Croitoru, Mihai ATTENDING Croitoru, Mihai cc:  ------------------------------------------------------------ LV EF: 60% - 65%  ------------------------------------------------------------ Indications: Aortic stenosis 424.1.  ------------------------------------------------------------ History: PMH: Mitral valve disease.  ------------------------------------------------------------ Study Conclusions  - Left ventricle:  Systolic function was normal. The estimated ejection fraction was in the range of 60% to 65%. - Aortic  valve: planimetered AVA measured from 0.5 to 0.7cm2 consistent with severe AS Severe diffuse thickening and calcification. There was severe stenosis. Mild regurgitation directed eccentrically in the LVOT. - Mitral valve: There posterior MV leaflet and annulus are heavily calcified and are fixed. The anterior mitral valve leaflet is mildly thickened. The entire MV annulus is heavily calcified. The mean gradient ranged from 9-79mmHg. The findings are consistent with severe stenosis. Mild regurgitation. - Left atrium: The atrium was severely dilated. No evidence of thrombus in the atrial cavity or appendage. No evidence of thrombus in the appendage. There was spontaneous echo contrast ("smoke"). - Right atrium: The atrium was moderately dilated. - Atrial septum: No defect or patent foramen ovale was identified. - Tricuspid valve: No evidence of vegetation. - Pulmonic valve: No evidence of vegetation. - Superior vena cava: The study excluded a thrombus. Transesophageal echocardiography. 2D and color Doppler. Height: Height: 172.7cm. Height: 68in. Weight: Weight: 104.1kg. Weight: 229lb. Body mass index: BMI: 34.9kg/m^2. Body surface area: BSA: 2.77m^2. Blood pressure: 150/60. Patient status: Inpatient. Location: Endoscopy.  ------------------------------------------------------------  ------------------------------------------------------------ Left ventricle: Systolic function was normal. The estimated ejection fraction was in the range of 60% to 65%.  ------------------------------------------------------------ Aortic valve: planimetered AVA measured from 0.5 to 0.7cm2 consistent with severe AS Severe diffuse thickening and calcification. Doppler: There was severe stenosis. Mild regurgitation directed eccentrically in the LVOT. Planimetered valve area: 0.51cm^2. Indexed valve area by planimetry: 0.22cm^2/m^2.  ------------------------------------------------------------ Aorta:  The aorta was normal, not dilated, and non-diseased.  ------------------------------------------------------------ Mitral valve: There posterior MV leaflet and annulus are heavily calcified and are fixed. The anterior mitral valve leaflet is mildly thickened. The entire MV annulus is heavily calcified. The mean gradient ranged from 9-19mmHg. Doppler: The findings are consistent with severe stenosis. Mild regurgitation. Mean gradient: 44mm Hg (D). Peak gradient: 8mm Hg (D).  ------------------------------------------------------------ Left atrium: The atrium was severely dilated. No evidence of thrombus in the atrial cavity or appendage. No evidence of thrombus in the appendage. There was spontaneous echo contrast ("smoke"). The appendage was of normal size. Emptying velocity was normal.  ------------------------------------------------------------ Atrial septum: marked lipomatous hypertrophy of the interatrial septum No defect or patent foramen ovale was identified.  ------------------------------------------------------------ Pulmonic valve: Structurally normal valve. Cusp separation was normal. No evidence of vegetation. Doppler: Trivial regurgitation.  ------------------------------------------------------------ Tricuspid valve: Structurally normal valve. Leaflet separation was normal. No evidence of vegetation. Doppler: No regurgitation.  ------------------------------------------------------------ Right atrium: The atrium was moderately dilated.  ------------------------------------------------------------ Pericardium: The pericardium was normal in appearance. There was no pericardial effusion.  ------------------------------------------------------------ Systemic veins: Superior vena cava: The study excluded a thrombus.  ------------------------------------------------------------ Post procedure conclusions Ascending Aorta:  - The aorta was normal, not dilated,  and non-diseased.  ------------------------------------------------------------  2D measurements Normal Doppler measurements Normal Aortic valve Mitral valve Area 0.51 cm^2 ------ Mean vel, D 156 cm/s ------ (plan) Mean 11 mm ------ Area 0.22 cm^2/m^2 ------ gradient, D Hg index Peak 21 mm ------ (plan) gradient, D Hg Annulus VTI 45.6 cm ------  ------------------------------------------------------------ Michaelle Birks, MD [id number]      She has symptomatic severe aortic stenosis and severe mitral stenosis by prior 2-D echo and cath with extensive calcification of the mitral annulus and intervalvular region. She has had stable exertional dyspnea until this acute episode post-cath which is probably acute diastolic heart failure related to the cath. She improved quickly with diuresis. She will require  AVR and MVR. I would plan to use tissue valves given her age of 62 and history of rectal cancer, IBS, COPD. I discussed the operative procedure with the patient including alternatives, benefits and risks; including but not limited to bleeding, blood transfusion, infection, stroke, myocardial infarction, graft failure, heart block requiring a permanent pacemaker, organ dysfunction, and death. D[place]ya [place] understands and agrees to proceed.     Preparation:  The patient was seen in the preoperative holding area and the correct patient, correct operation were confirmed with the patient after reviewing the medical record and catheterization. The consent was signed by me. Preoperative antibiotics were given. A pulmonary arterial line and radial arterial line were placed by the anesthesia team. The patient was taken back to the operating room and positioned supine on the operating room table. After being placed under general endotracheal anesthesia by the anesthesia team a foley catheter was placed. The neck, chest, abdomen, and both legs were prepped with betadine soap and  solution and draped in the usual sterile manner. A surgical time-out was taken and the correct patient and operative procedure were confirmed with the nursing and anesthesia staff.   Intraoperative TEE:   Complete TEE assessment was performed by Dr. Rica Koyanagi.  PHYSICIAN: Ala Dach, M.D.DATE OF BIRTH: 10-19-1945  DATE OF PROCEDURE: 12/12/2013  DATE OF DISCHARGE:  OPERATIVE REPORT  Intraoperative Transesophageal Echocardiographic Report  INDICATION FOR PROCEDURE: Ms. Vanasten is a 68 year old female who  presents today for aortic valve replacement and possible mitral valve  repair or replacement to be performed by Dr. Gilford Raid. She is  brought to the holding area in the morning of surgery where under local  anesthesia with sedation, pulmonary artery and radial arterial catheters  were placed. She has been transferred to the OR for routine induction  of general anesthesia after which the TEE probe was prepared, then  passed oropharyngeal into the stomach before slightly withdrawing for  imaging of the cardiac structures.  PRECARDIOPULMONARY BYPASS TEE EXAMINATION: Left ventricle: There is  significant concentric pattern of left ventricular hypertrophy noted in  her short-axis view of the left ventricular chamber. There is near  collapse of the left ventricular chamber during systole and near  obliteration of the chamber sizes self during systole. Papillary  muscles contact during the systolic contraction. The walls of the  ventricle are thickened concentrically. They area nearly 2 cm, 20 mm in  diameter. Long-axis views are also obtained.  Mitral valve: The mitral valve is seen initially in the four-chamber  view. There is heavy mitral annular calcification appreciated. There  is a heavy calcium and a nearly fixed posterior leaflet of the mitral  valve apparatus. Overall, the whole inflow area of the mitral valve  seems narrowed at the orifice associated with diminished  opening of the  leaflets themselves suggestive of a pattern of mitral stenosis.  Continuous-wave Doppler across the mitral valve and a pressure half time  calculation reveals a 1-1.25 cm2 area of the mitral inflow area. This  is also associated with turbulent flow on color Doppler examination.  Again multiple views of the mitral valve are carried out consistent with  mitral narrowing and near mitral stenosis.  Aortic valve: The aortic valve is seen in the short-axis view. It is  trileaflet. However, there is severe aortic stenosis appreciated with  significant limited opening of these aortic valve leaflets. Planimetry  examination reveals only about a 0.5-0.7 cm2 opening of the aortic valve  consistent with  severe aortic stenosis. Both color Doppler also reveals  a mild central jet, but more importantly, a significant stenotic jet  well into the ascending aorta.  Left atrium: The left atrial chamber is enlarged and dilated. On pulse-  wave Doppler across this into the pulmonary vein, there is a blunted  pulmonary weight pattern. The interatrial septum is interrogated and  noted to have lipomatous changes. No patent foramen ovale is detected.  The left atrial appendage is interrogated in clear thrombus.  Right ventricle: The right ventricular chamber is normal right  ventricular upper chamber overall with normal contractility appreciated.  Tricuspid valve: The tricuspid valve has thin compliant mobile  leaflets. This is associated with trivial-to-mild tricuspid regurgitant  jet.  Right atrium: Normal right atrial chamber is appreciated.  The patient was placed on cardiopulmonary bypass. The operation is a  mitral valve repair with a low-profile bioprosthetic valve.  Subsequently, a diseased aortic valve was excised and replaced with a  bioprosthetic valve as well. The airing maneuvers were carried out.  The patient has rewarmed and separated from cardiopulmonary bypass with  the initial  attempt.  POSTCARDIOPULMONARY BYPASS TEE EXAMINATION: Mitral valve: In the  mitral valve area, we can see the ring apparatus in the leaflets  themselves of this new bioprosthetic valve that is seated slightly  eccentrically due to the heavy calcium on the posterior area of the left  atrium. The valve itself however is stationary. The leaflets appear to  open satisfactorily during diastolic inflow. On closure, there is no  mitral regurgitation appreciated. Overall, this appears to  be satisfactory replacement of the mitral valve.  Aortic valve: In place of the diseased aortic valve, could not be seen  a trileaflet bioprosthetic valve in place. It is normally seated and  normally functioning. Color Doppler across the valve and long-axis  views again revealed no aortic insufficiency and no aortic stenosis.  Again, this appears to be a satisfactory repair.  Left ventricle: The left ventricular chamber is viewed in the early  bypass. It is thickened though it is contractile. All segmental wall  areas appear contractile. Overall, no significant changes are  appreciated.  The patient is returned to the cardiac intensive care unit in stable  condition.  ______________________________  Ala Dach, [name]      Cardiopulmonary Bypass:  A median sternotomy was performed. The pericardium was opened in the midline. Right ventricular function appeared normal. The ascending aorta was of normal size and had no palpable plaque. There were no contraindications to aortic cannulation or cross-clamping. The patient was fully systemically heparinized and the ACT was maintained > 400 sec. The proximal aortic arch was cannulated with a 20 F aortic cannula for arterial inflow. Bi-caval venous cannulation was performed using a 24 F metal tip right angle cannula in the SVC and a 36 F malleable plastic cannula in the low right atrium.  An antegrade cardioplegia/vent cannula was inserted into the mid-ascending  aorta. A left ventricular vent was placed via the left atrium. A retrograde cardioplegia cannnula was placed into the coronary sinus via the right atrium. Aortic occlusion was performed with a single cross-clamp. Systemic cooling to 28 degrees Centigrade and topical cooling of the heart with iced saline were used. Hyperkalemic antegrade cold blood cardioplegia was used to induce diastolic arrest and then cold blood retrograde cardioplegia was given at about 20 minute intervals throughout the period of arrest to maintain myocardial temperature at or below 10 degrees centigrade. A temperature probe was  inserted into the interventricular septum and an insulating pad was placed in the pericardium. Carbon dioxide was insufflated into the pericardium at 5L/min throughout the procedure to minimize intracardiac air.   Aortic Valve Replacement:   A transverse aortotomy was performed 1 cm above the take-off of the right coronary artery. The native valve was tricuspid with calcified leaflets and moderate annular calcification. The ostia of the coronary arteries were in normal position and were not obstructed. The native valve leaflets were excised and the annulus was decalcified with rongeurs. Care was taken to remove all particulate debris. The left ventricle was directly inspected for debris and then irrigated with ice saline solution. Then attention was turned to the mitral valve replacement and when that was completed the aortic valve was replaced. The annulus was sized and a size 21 Edwards Magna-Ease Pericardial  valve was chosen. The model number was [id number] and the serial number was [id number]. While the valve was being prepared 2-0 Ethibond pledgeted horizontal mattress sutures were placed around the annulus with the pledgets in a sub-annular position. The sutures were placed through the sewing ring and the valve lowered into place. The sutures were tied sequentially. The valve seated nicely and the coronary ostia  were not obstructed. The prosthetic valve leaflets moved normally and there was no sub-valvular obstruction. The aortotomy was closed using 4-0 Prolene suture in 2 layers with felt strips to reinforce the closure.   Mitral Valve Replacement:  The left atrium was opened through a vertical incision in the interatrial groove. Exposure was good. Valve inspection showed complete calcification of the posterior leaflet and the mitral annulus except for a small segment of the anterior annulus. There was a very small mitral opening and a large bar of calcium posteriorly encompassing the annulus. The anterior leaflet was thickened and partially mobile. It was resected. The posterior bulky bar of calcification was debrided with rongeurs to enlarge the mitral opening as much as possible. This was very tedious and took considerable time to prevent atrioventricular disruption. It was not possible to place sutures through most of the annulus due to extreme calcification. Therefore a series of pledgetted 2-0 Ethibond sutures were placed above  the mitral annulus in the left atrial wall from the medial fibrous trigone going posteriorly to the lateral fibrous trigone. Care was taken not to place the sutures too deeply to avoid the left circumflex artery. It was possible to place 3 sutures through the annulus anteriorly beneath the commissure of the left and noncoronary aortic leaflets. A 25 mm Edwards Magna-Ease Pericardial mitral prosthesis was chosen. This had model number [id number] and serial number [id number]. The sutures were placed through the sewing cuff and the valve was lowered into place. The sutures were tied. The valve was tested with saline and the leaflets moved normally. There did not appear to be any leak around the valve. The atrium was closed with 2 layers of continuous 3-0 prolene suture.  Completion:  The patient was rewarmed to 37 degrees Centigrade. De-airing maneuvers were performed and the head placed in  trendelenburg position. The crossclamp was removed with a time of 244 minutes. There was spontaneous return of sinus rhythm. The aortotomy was checked for hemostasis. Two temporary epicardial pacing wires were placed on the right atrium and two on the right ventricle. The left ventricular vent and retrograde cardioplegia cannulas were removed. The patient was weaned from CPB without difficulty on no inotropes. CPB time was 273 minutes. Cardiac output was 5 LPM. Heparin was  fully reversed with protamine and the aortic and venous cannulas removed. Hemostasis was achieved. Mediastinal drainage tubes were placed. The sternum was closed with double #6 stainless steel wires. The fascia was closed with continuous # 1 vicryl suture. The subcutaneous tissue was closed with 2-0 vicryl continuous suture. The skin was closed with 3-0 vicryl subcuticular suture. All sponge, needle, and instrument counts were reported correct at the end of the case. Dry sterile dressings were placed over the incisions and around the chest tubes which were connected to pleurevac suction. The patient was then transported to the surgical intensive care unit in critical but stable condition.

## 2013-12-13 NOTE — Progress Notes (Signed)
One hour post extubation ABG called and orders received. Will continue to monitor.

## 2013-12-13 NOTE — Progress Notes (Signed)
[facility] BRIEF SICU PROGRESS NOTE  1 Day Post-Op  S/P Procedure(s) (LRB): AORTIC VALVE REPLACEMENT (AVR) (N/A) MITRAL VALVE (MV) REPLACEMENT OR REPAIR (N/A) INTRAOPERATIVE TRANSESOPHAGEAL ECHOCARDIOGRAM (N/A)   Stable day AV paced w/ BP 76-160 systolic by cuff on low dose dopamine Diuresing very well Labs stable  Plan: Continue current plan.  Watch BP - may need to give some volume back if she drops further  [name] H 12/13/2013 6:31 PM

## 2013-12-13 NOTE — Progress Notes (Signed)
1 Day Post-Op Procedure(s) (LRB): AORTIC VALVE REPLACEMENT (AVR) (N/A) MITRAL VALVE (MV) REPLACEMENT OR REPAIR (N/A) INTRAOPERATIVE TRANSESOPHAGEAL ECHOCARDIOGRAM (N/A) Subjective:  Feels lousy but nothing specific.  Objective: Vital signs in last 24 hours: Temp:  [96.3 F (35.7 C)-100.4 F (38 C)] 98.4 F (36.9 C) (03/06 0900) Pulse Rate:  [89-91] 91 (03/06 0900) Cardiac Rhythm:  [-] A-V Sequential paced (03/06 0800) Resp:  [0-21] 11 (03/06 0900) BP: (83-111)/(36-65) 103/60 mmHg (03/06 0900) SpO2:  [88 %-100 %] 98 % (03/06 0900) Arterial Line BP: (90-135)/(41-71) 120/56 mmHg (03/06 0900) FiO2 (%):  [40 %-50 %] 40 % (03/05 2159) Weight:  [108.4 kg (238 lb 15.7 oz)] 108.4 kg (238 lb 15.7 oz) (03/06 0500)  Hemodynamic parameters for last 24 hours: PAP: (36-57)/(19-32) 49/30 mmHg CO:  [2.4 L/min-5.9 L/min] 4.6 L/min CI:  [1.2 L/min/m2-2.9 L/min/m2] 2.3 L/min/m2  Intake/Output from previous day: 03/05 0701 - 03/06 0700 In: 5621.8 [I.V.:3561.8; Blood:650; NG/GT:60; IV Piggyback:1350] Out: 4804 [Urine:2975; Blood:1150; Chest Tube:400] Intake/Output this shift: Total I/O In: 152.6 [I.V.:102.6; IV Piggyback:50] Out: 175 [Urine:175]  General appearance: alert and cooperative Neurologic: intact Heart: regular rate and rhythm, systolic flow murmur Lungs: clear to auscultation bilaterally Abdomen: soft, non-tender; bowel sounds normal; no masses,  no organomegaly Extremities: edema mild Wound: dressing dry  Lab Results:  Recent Labs  12/12/13 2109 12/13/13 0407  WBC 16.7* 20.1*  HGB 8.8* 10.0*  HCT 26.5* 30.3*  PLT 120* 129*   BMET:  Recent Labs  12/12/13 2105 12/12/13 2109 12/13/13 0407  NA 141  --  140  K 4.0  --  4.4  CL 102  --  106  CO2  --   --  24  GLUCOSE 143*  --  108*  BUN 17  --  18  CREATININE 0.80 0.82 0.86  CALCIUM  --   --  7.9*    PT/INR:  Recent Labs  12/12/13 1500  LABPROT 17.6*  INR 1.49   ABG    Component Value Date/Time   PHART  7.274* 12/12/2013 2354   HCO3 24.8* 12/12/2013 2354   TCO2 26 12/12/2013 2354   ACIDBASEDEF 2.0 12/12/2013 2354   O2SAT 96.0 12/12/2013 2354   CBG (last 3)   Recent Labs  12/13/13 0400 12/13/13 0500 12/13/13 0606  GLUCAP 105* 101* 94    Assessment/Plan: S/P Procedure(s) (LRB): AORTIC VALVE REPLACEMENT (AVR) (N/A) MITRAL VALVE (MV) REPLACEMENT OR REPAIR (N/A) INTRAOPERATIVE TRANSESOPHAGEAL ECHOCARDIOGRAM (N/A) She is hemodynamically stable on low dose dopamine and neo. Wean neo as tolerated. Will continue dopamine today. She is AV paced at 90, looks like CHB or junctional brady under pacer at this time. Start coumadin slowly tonight. She has 2 tissue valves but should be on coumadin for 3 months. Mobilize Diuresis Diabetes control Continue foley due to patient in ICU and urinary output monitoring See progression orders   LOS: 1 day    Yacine Droz K 12/13/2013

## 2013-12-13 NOTE — Clinical Documentation Improvement (Signed)
12/13/13  Dear Dr. Cyndia Bent Rolley Sims  Possible Clinical Conditions?    Expected Acute Blood Loss Anemia  Acute Blood Loss Anemia  Acute on chronic blood loss anemia  Precipitous drop in Hematocrit  Other Condition  Cannot Clinically Determine   Risk Factors:  EBL: 1150 ml per 3/05 Anesthesia record. Patient with a history of anemia per 3/05 H&P.   Diagnostics: H&H on 3/05:  8.5/25.0 H&H on 3/05:  12.6/37.0  Transfusion: 3/05: order to transfuse PRBC IV fluids / plasma expanders: Cell saver: 300 ml per 3/05 Anesthesia record. Albumin 5%: 250 ml per 3/05 Anesthesia record. LR:  2200 ml per 3/05 Anesthesia record.  Thank You, Theron Arista, Clinical Documentation Specialist:  [phone]  [facility] Information Management

## 2013-12-14 ENCOUNTER — Inpatient Hospital Stay (HOSPITAL_COMMUNITY): Payer: Medicare Other

## 2013-12-14 LAB — CBC
HCT: 28.4 % — ABNORMAL LOW (ref 36.0–46.0)
Hemoglobin: 9.4 g/dL — ABNORMAL LOW (ref 12.0–15.0)
MCH: 31.1 pg (ref 26.0–34.0)
MCHC: 33.1 g/dL (ref 30.0–36.0)
MCV: 94 fL (ref 78.0–100.0)
Platelets: 55 10*3/uL — ABNORMAL LOW (ref 150–400)
RBC: 3.02 MIL/uL — ABNORMAL LOW (ref 3.87–5.11)
RDW: 15.6 % — ABNORMAL HIGH (ref 11.5–15.5)
WBC: 11.9 10*3/uL — ABNORMAL HIGH (ref 4.0–10.5)

## 2013-12-14 LAB — BASIC METABOLIC PANEL
BUN: 19 mg/dL (ref 6–23)
CO2: 25 mEq/L (ref 19–32)
Calcium: 8.4 mg/dL (ref 8.4–10.5)
Chloride: 102 mEq/L (ref 96–112)
Creatinine, Ser: 0.89 mg/dL (ref 0.50–1.10)
GFR calc Af Amer: 75 mL/min — ABNORMAL LOW (ref >90)
GFR calc non Af Amer: 65 mL/min — ABNORMAL LOW (ref >90)
Glucose, Bld: 115 mg/dL — ABNORMAL HIGH (ref 70–99)
Potassium: 4.2 mEq/L (ref 3.7–5.3)
Sodium: 137 mEq/L (ref 137–147)

## 2013-12-14 LAB — GLUCOSE, CAPILLARY
Glucose-Capillary: 105 mg/dL — ABNORMAL HIGH (ref 70–99)
Glucose-Capillary: 106 mg/dL — ABNORMAL HIGH (ref 70–99)
Glucose-Capillary: 107 mg/dL — ABNORMAL HIGH (ref 70–99)
Glucose-Capillary: 108 mg/dL — ABNORMAL HIGH (ref 70–99)
Glucose-Capillary: 109 mg/dL — ABNORMAL HIGH (ref 70–99)
Glucose-Capillary: 112 mg/dL — ABNORMAL HIGH (ref 70–99)

## 2013-12-14 LAB — PROTIME-INR
INR: 1.55 — ABNORMAL HIGH (ref 0.00–1.49)
Prothrombin Time: 18.2 seconds — ABNORMAL HIGH (ref 11.6–15.2)

## 2013-12-14 MED ORDER — INSULIN ASPART 100 UNIT/ML ~~LOC~~ SOLN: 0.0000 [IU] | Freq: Three times a day (TID) | SUBCUTANEOUS | Status: DC

## 2013-12-14 MED ORDER — WARFARIN SODIUM 2.5 MG PO TABS
2.5000 mg | ORAL_TABLET | Freq: Every day | ORAL | Status: DC
  Administered 2013-12-14 – 2013-12-17 (×4): 2.5 mg via ORAL
  Filled 2013-12-14 (×6): qty 1

## 2013-12-14 MED ORDER — INSULIN ASPART 100 UNIT/ML ~~LOC~~ SOLN: 0.0000 [IU] | SUBCUTANEOUS | Status: DC

## 2013-12-14 MED ORDER — DOPAMINE-DEXTROSE 3.2-5 MG/ML-% IV SOLN
0.0000 ug/kg/min | INTRAVENOUS | Status: DC
  Administered 2013-12-14: 3 ug/kg/min via INTRAVENOUS
  Filled 2013-12-14: qty 250

## 2013-12-14 MED ORDER — MORPHINE SULFATE 2 MG/ML IJ SOLN: 1.0000 mg | INTRAMUSCULAR | Status: DC | PRN

## 2013-12-14 NOTE — Progress Notes (Signed)
[address]       [place] [address]             [phone]        CARDIOTHORACIC SURGERY PROGRESS NOTE   R2 Days Post-Op Procedure(s) (LRB): AORTIC VALVE REPLACEMENT (AVR) (N/A) MITRAL VALVE (MV) REPLACEMENT OR REPAIR (N/A) INTRAOPERATIVE TRANSESOPHAGEAL ECHOCARDIOGRAM (N/A)  Subjective: Feels tired but o/w okay.  Mild soreness in chest.  Some dyspnea with exertion but not at rest.  No nausea.  Objective: Vital signs: BP Readings from Last 1 Encounters:  12/14/13 100/53   Pulse Readings from Last 1 Encounters:  12/14/13 91   Resp Readings from Last 1 Encounters:  12/14/13 16   Temp Readings from Last 1 Encounters:  12/14/13 97.9 F (36.6 C) Oral    Hemodynamics:    Physical Exam:  Rhythm:   Looks junctional w/ narrow complex, HR 70  Breath sounds: clear  Heart sounds:  RRR  Incisions:  Dressing dry, intact  Abdomen:  Soft, non-distended, non-tender  Extremities:  Warm, well-perfused    Intake/Output from previous day: 03/06 0701 - 03/07 0700 In: 976.8 [I.V.:526.8; IV Piggyback:450] Out: 2785 [Urine:2755; Chest Tube:30] Intake/Output this shift: Total I/O In: 51 [I.V.:51] Out: 90 [Urine:90]  Lab Results:  CBC: Recent Labs  12/13/13 1640 12/13/13 1641 12/14/13 0420  WBC 15.9*  --  11.9*  HGB 10.3* 10.2* 9.4*  HCT 30.8* 30.0* 28.4*  PLT 71*  --  55*    BMET:  Recent Labs  12/13/13 0407  12/13/13 1641 12/14/13 0420  NA 140  --  137 137  K 4.4  --  3.9 4.2  CL 106  --  103 102  CO2 24  --   --  25  GLUCOSE 108*  --  112* 115*  BUN 18  --  18 19  CREATININE 0.86  < > 1.10 0.89  CALCIUM 7.9*  --   --  8.4  < > = values in this interval not displayed.   CBG (last 3)   Recent Labs  12/13/13 1924 12/13/13 2332 12/14/13 0346  GLUCAP 109* 107* 108*    ABG    Component Value Date/Time   PHART 7.274* 12/12/2013 2354   PCO2ART 54.0* 12/12/2013 2354   PO2ART 102.0* 12/12/2013 2354   HCO3 24.8* 12/12/2013 2354   TCO2 25 12/13/2013 1641   ACIDBASEDEF 2.0 12/12/2013 2354   O2SAT 96.0 12/12/2013 2354    CXR: PORTABLE CHEST - 1 VIEW  COMPARISON: 12/13/2013  FINDINGS:  There is a right IJ catheter sheath with tip in the projection of  the SVC. There are postoperative changes compatible with median  sternotomy and aortic valve replacement. There are small bilateral  pleural effusions, pulmonary vascular congestion and bibasilar  atelectasis which appears similar to previous exam. No pneumothorax  identified.  IMPRESSION:  1. No change in aeration to the lungs compared with previous exam.  Electronically Signed  By: Kerby Moors [name]  On: 12/14/2013 08:10   Assessment/Plan: S/P Procedure(s) (LRB): AORTIC VALVE REPLACEMENT (AVR) (N/A) MITRAL VALVE (MV) REPLACEMENT OR REPAIR (N/A) INTRAOPERATIVE TRANSESOPHAGEAL ECHOCARDIOGRAM (N/A)  Overall stable POD2 Rhythm narrow complex, regular and stable - looks junctional BP dropped last night after given lasix, improved w/ volume - will hold lasix this am Expected post op acute blood loss anemia, mild Expected post op atelectasis, mild Expected post op volume excess, stable, weight 10 kg > baseline Type II diabetes mellitus, excellent glycemic control  Wean dopamine as tolerated  Mobilize  Continue coumadin  Travarus Trudo H 12/14/2013 10:23 AM

## 2013-12-14 NOTE — Progress Notes (Signed)
[facility] BRIEF SICU PROGRESS NOTE  2 Days Post-Op  S/P Procedure(s) (LRB): AORTIC VALVE REPLACEMENT (AVR) (N/A) MITRAL VALVE (MV) REPLACEMENT OR REPAIR (N/A) INTRAOPERATIVE TRANSESOPHAGEAL ECHOCARDIOGRAM (N/A)   Feels okay Back in 3rd degree AV block, AV pacing BP stable but still on low dose dopamine O2 sats 99% UOP adequate  Plan: Continue current plan  Glenisha Gundry H 12/14/2013 5:21 PM

## 2013-12-15 LAB — CBC
HCT: 28.4 % — ABNORMAL LOW (ref 36.0–46.0)
Hemoglobin: 9.1 g/dL — ABNORMAL LOW (ref 12.0–15.0)
MCH: 30.1 pg (ref 26.0–34.0)
MCHC: 32 g/dL (ref 30.0–36.0)
MCV: 94 fL (ref 78.0–100.0)
Platelets: 61 10*3/uL — ABNORMAL LOW (ref 150–400)
RBC: 3.02 MIL/uL — ABNORMAL LOW (ref 3.87–5.11)
RDW: 15.3 % (ref 11.5–15.5)
WBC: 10.9 10*3/uL — ABNORMAL HIGH (ref 4.0–10.5)

## 2013-12-15 LAB — GLUCOSE, CAPILLARY
Glucose-Capillary: 107 mg/dL — ABNORMAL HIGH (ref 70–99)
Glucose-Capillary: 99 mg/dL (ref 70–99)
Glucose-Capillary: 110 mg/dL — ABNORMAL HIGH (ref 70–99)

## 2013-12-15 LAB — PROTIME-INR
INR: 1.25 (ref 0.00–1.49)
Prothrombin Time: 15.4 seconds — ABNORMAL HIGH (ref 11.6–15.2)

## 2013-12-15 LAB — BASIC METABOLIC PANEL
BUN: 23 mg/dL (ref 6–23)
CO2: 27 mEq/L (ref 19–32)
Calcium: 8.6 mg/dL (ref 8.4–10.5)
Chloride: 100 mEq/L (ref 96–112)
Creatinine, Ser: 0.86 mg/dL (ref 0.50–1.10)
GFR calc Af Amer: 79 mL/min — ABNORMAL LOW (ref >90)
GFR calc non Af Amer: 68 mL/min — ABNORMAL LOW (ref >90)
Glucose, Bld: 100 mg/dL — ABNORMAL HIGH (ref 70–99)
Potassium: 4.2 mEq/L (ref 3.7–5.3)
Sodium: 136 mEq/L — ABNORMAL LOW (ref 137–147)

## 2013-12-15 MED ORDER — FUROSEMIDE 10 MG/ML IJ SOLN
40.0000 mg | Freq: Two times a day (BID) | INTRAMUSCULAR | Status: DC
  Administered 2013-12-15 (×2): 40 mg via INTRAVENOUS
  Filled 2013-12-15 (×4): qty 4

## 2013-12-15 NOTE — Progress Notes (Signed)
[facility] BRIEF SICU PROGRESS NOTE  3 Days Post-Op  S/P Procedure(s) (LRB): AORTIC VALVE REPLACEMENT (AVR) (N/A) MITRAL VALVE (MV) REPLACEMENT OR REPAIR (N/A) INTRAOPERATIVE TRANSESOPHAGEAL ECHOCARDIOGRAM (N/A)   Stable day Sinus w/ 3rd degree AV block - sinus node rate 95-100, AV pacing BP stable Diuresing some  Plan: Continue current plan  [name] 12/15/2013 6:36 PM

## 2013-12-15 NOTE — Progress Notes (Signed)
      [address]       [place] [address]             [phone]        CARDIOTHORACIC SURGERY PROGRESS NOTE   R3 Days Post-Op Procedure(s) (LRB): AORTIC VALVE REPLACEMENT (AVR) (N/A) MITRAL VALVE (MV) REPLACEMENT OR REPAIR (N/A) INTRAOPERATIVE TRANSESOPHAGEAL ECHOCARDIOGRAM (N/A)  Subjective: Feels a little better.  Ambulated once this morning.  Ate 1/2 of breakfast.  Objective: Vital signs: BP Readings from Last 1 Encounters:  12/15/13 135/58   Pulse Readings from Last 1 Encounters:  12/15/13 80   Resp Readings from Last 1 Encounters:  12/15/13 19   Temp Readings from Last 1 Encounters:  12/15/13 97.8 F (36.6 C) Oral    Hemodynamics:    Physical Exam:  Rhythm:   Slow junctional escape - remains pacer dependent  Breath sounds: clear  Heart sounds:  RRR  Incisions:  Clean and dry  Abdomen:  Soft, non-distended, non-tender  Extremities:  Warm, well-perfused    Intake/Output from previous day: 03/07 0701 - 03/08 0700 In: 846 [P.O.:200; I.V.:646] Out: 770 [Urine:770] Intake/Output this shift: Total I/O In: 48 [I.V.:48] Out: -   Lab Results:  CBC: Recent Labs  12/14/13 0420 12/15/13 0426  WBC 11.9* 10.9*  HGB 9.4* 9.1*  HCT 28.4* 28.4*  PLT 55* 61*    BMET:  Recent Labs  12/14/13 0420 12/15/13 0426  NA 137 136*  K 4.2 4.2  CL 102 100  CO2 25 27  GLUCOSE 115* 100*  BUN 19 23  CREATININE 0.89 0.86  CALCIUM 8.4 8.6     CBG (last 3)   Recent Labs  12/14/13 1723 12/14/13 2119 12/15/13 0634  GLUCAP 105* 106* 107*    ABG    Component Value Date/Time   PHART 7.274* 12/12/2013 2354   PCO2ART 54.0* 12/12/2013 2354   PO2ART 102.0* 12/12/2013 2354   HCO3 24.8* 12/12/2013 2354   TCO2 25 12/13/2013 1641   ACIDBASEDEF 2.0 12/12/2013 2354   O2SAT 96.0 12/12/2013 2354    CXR: n/a  Assessment/Plan: S/P Procedure(s) (LRB): AORTIC VALVE REPLACEMENT (AVR) (N/A) MITRAL VALVE (MV) REPLACEMENT OR REPAIR (N/A) INTRAOPERATIVE  TRANSESOPHAGEAL ECHOCARDIOGRAM (N/A)  Overall stable POD3 Remains pacer-dependent, underlying rhythm narrow complex w/ HR 30-40 BP stable Expected post op acute blood loss anemia, mild, stable Post op thrombocytopenia, slowly improving Expected post op atelectasis, mild Expected post op volume excess, stable, weight 10 kg > baseline Type II diabetes mellitus, excellent glycemic control     Mobilize  Restart diuretics  Watch platelet count  Continue coumadin  Keep in SICU since she remains pacer dependent w/ slow underlying escape rhythm   Dakarai Mcglocklin H 12/15/2013 10:17 AM

## 2013-12-16 ENCOUNTER — Inpatient Hospital Stay (HOSPITAL_COMMUNITY): Payer: Medicare Other

## 2013-12-16 DIAGNOSIS — I442 Atrioventricular block, complete: Secondary | ICD-10-CM

## 2013-12-16 LAB — TYPE AND SCREEN
ABO/RH(D): A NEG
Antibody Screen: NEGATIVE
Unit division: 0
Unit division: 0
Unit division: 0

## 2013-12-16 LAB — CBC
HCT: 28.5 % — ABNORMAL LOW (ref 36.0–46.0)
Hemoglobin: 9.3 g/dL — ABNORMAL LOW (ref 12.0–15.0)
MCH: 30.8 pg (ref 26.0–34.0)
MCHC: 32.6 g/dL (ref 30.0–36.0)
MCV: 94.4 fL (ref 78.0–100.0)
Platelets: 67 10*3/uL — ABNORMAL LOW (ref 150–400)
RBC: 3.02 MIL/uL — ABNORMAL LOW (ref 3.87–5.11)
RDW: 15.2 % (ref 11.5–15.5)
WBC: 8.1 10*3/uL (ref 4.0–10.5)

## 2013-12-16 LAB — BASIC METABOLIC PANEL
BUN: 28 mg/dL — ABNORMAL HIGH (ref 6–23)
CO2: 27 mEq/L (ref 19–32)
Calcium: 8.6 mg/dL (ref 8.4–10.5)
Chloride: 100 mEq/L (ref 96–112)
Creatinine, Ser: 1.16 mg/dL — ABNORMAL HIGH (ref 0.50–1.10)
GFR calc Af Amer: 55 mL/min — ABNORMAL LOW (ref >90)
GFR calc non Af Amer: 47 mL/min — ABNORMAL LOW (ref >90)
Glucose, Bld: 101 mg/dL — ABNORMAL HIGH (ref 70–99)
Potassium: 4 mEq/L (ref 3.7–5.3)
Sodium: 139 mEq/L (ref 137–147)

## 2013-12-16 LAB — PROTIME-INR
INR: 1.47 (ref 0.00–1.49)
Prothrombin Time: 17.4 seconds — ABNORMAL HIGH (ref 11.6–15.2)

## 2013-12-16 MED ORDER — SODIUM CHLORIDE 0.9 % IR SOLN
80.0000 mg | Status: DC
  Filled 2013-12-16: qty 2

## 2013-12-16 MED ORDER — FUROSEMIDE 10 MG/ML IJ SOLN
80.0000 mg | Freq: Two times a day (BID) | INTRAMUSCULAR | Status: DC
  Administered 2013-12-16 – 2013-12-18 (×5): 80 mg via INTRAVENOUS
  Filled 2013-12-16 (×6): qty 8

## 2013-12-16 MED ORDER — CEFAZOLIN SODIUM-DEXTROSE 2-3 GM-% IV SOLR
2.0000 g | INTRAVENOUS | Status: DC
  Filled 2013-12-16: qty 50

## 2013-12-16 MED ORDER — FUROSEMIDE 10 MG/ML IJ SOLN: 80.0000 mg | Freq: Two times a day (BID) | INTRAMUSCULAR | Status: DC

## 2013-12-16 MED ORDER — SODIUM CHLORIDE 0.9 % IV SOLN
INTRAVENOUS | Status: DC
  Administered 2013-12-16: 14:00:00 via INTRAVENOUS

## 2013-12-16 NOTE — Plan of Care (Signed)
Problem: Phase III - Recovery through Discharge Goal: Activity Progressed Outcome: Progressing Ambulated 100 ft tonight with only 1 stop to sit and was on 2 L/min [place]. Sats 89-92% with ambulation and back up to 96% at completion.

## 2013-12-16 NOTE — Progress Notes (Signed)
CT surgery p.m. Rounds  The patient is sitting up in chair comfortable AV paced stable hemodynamics Evaluation by EP for permanent pacemaker has started Stable day

## 2013-12-16 NOTE — Consult Note (Signed)
ELECTROPHYSIOLOGY CONSULT NOTE  Patient ID: [name], MRN: [id number], DOB/AGE: 1946/08/24 68 y.o. Admit date: 12/12/2013 Date of Consult: 12/16/2013  Primary Physician: Illene Regulus, MD Primary Cardiologist: [facility] Chief Complaint: complete heart block   HPI [name] is a 68 y.o. 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 IBS (irritable bowel syndrome)   . Hyperlipidemia   . Allergic rhinitis   . History of pneumonia   . COPD (chronic obstructive pulmonary disease)   . Asthma   . Heart murmur     Aortic  . Lung nodule     RLL nodule-11mm stable 2006, April 2009, and June 2009  . Candidiasis of breast   . Anemia   . Hyperlipemia   . Obesity   . Unspecified essential hypertension   . Heart valve problem     PER CARDIOLOGIST 05-03-2011  . Sleep difficulties   . Hemorrhoids   . Sleep apnea     PT TOLD BORDERLINE-TRIED CPAP-DID NOT HELP-SHE DOES NOT USE CPAP  . Arthritis   . 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 History of radiation therapy 05/30/11 to 07/07/11    rectum  . Prophylactic chemotherapy     Will finish chemo on 03/04/12  . Pneumonia     hx of several times   . Shingles   . rectal ca 04/2011  . Severe aortic valve stenosis 10/01/2013  . Shortness of breath   . Rheumatic heart disease   . PONV (postoperative nausea and vomiting)   . Anxiety       Surgical History:  Past Surgical History  Procedure Laterality Date  . Foot surgery  left foot    hammer toe  . Hand surgery  Bil    carpal tunnel  . Back surgery      2006 SPACER---2007 LUMBAR FUSION  . Tonsillectomy    . Colon resection  08/25/2011    Procedure: COLON RESECTION LAPAROSCOPIC;  Surgeon: [name], MD;  Location: [facility] ORS;  Service: General;  Laterality: N/A;  Laparoscopic Assisted Low  Anterior Resection Diverting Ostomy and onQ pain pump  . Ileostomy  08/25/2011  . Colon surgery    . Ileostomy closure  04/02/2012    Procedure: ILEOSTOMY TAKEDOWN;  Surgeon: [name], MD;  Location: [facility] ORS;  Service: General;  Laterality: N/A;  . Bowel resection  04/02/2012    Procedure: SMALL BOWEL RESECTION;  Surgeon: [name], MD;  Location: [facility];  Service: General;  Laterality: N/A;  . Carpal tunnel release    . Cardiac catheterization  11/27/2013  . Tee without cardioversion N/A 11/29/2013    Procedure: TRANSESOPHAGEAL ECHOCARDIOGRAM (TEE);  Surgeon: Quintella Reichert, MD;  Location: [facility] ENDOSCOPY;  Service: Cardiovascular;  Laterality: N/A;  . Cardiac valve replacement    . Aortic valve replacement N/A 12/12/2013    Procedure: AORTIC VALVE REPLACEMENT (AVR);  Surgeon: Alleen Borne, MD;  Location: [facility] OR;  Service: Open Heart Surgery;  Laterality: N/A;  . Mitral valve replacement N/A 12/12/2013    Procedure: MITRAL VALVE (MV) REPLACEMENT OR REPAIR;  Surgeon: Alleen Borne, MD;  Location: [facility] OR;  Service: Open Heart Surgery;  Laterality: N/A;  . Intraoperative transesophageal echocardiogram N/A 12/12/2013    Procedure: INTRAOPERATIVE TRANSESOPHAGEAL ECHOCARDIOGRAM;  Surgeon: Judie Grieve  Alveria Apley, MD;  Location: [place];  Service: Open Heart Surgery;  Laterality: N/A;     Home Meds: Prior to Admission medications   Medication Sig Start Date End Date Taking? Authorizing Provider  albuterol (PROVENTIL HFA;VENTOLIN HFA) 108 (90 BASE) MCG/ACT inhaler Inhale 2 puffs into the lungs every 6 (six) hours as needed for wheezing.   Yes Historical Provider, MD  aspirin 81 MG tablet Take 81 mg by mouth at bedtime.    Yes Historical Provider, MD  Calcium Carbonate-Vitamin D (CALCIUM 600 + D PO) Take 1,200 mg by mouth at bedtime.    Yes Historical Provider, MD  diltiazem (CARDIZEM CD) 120 MG 24 hr capsule Take 120 mg by mouth at bedtime.    Yes Historical Provider, MD  Fluticasone Furoate-Vilanterol (BREO  ELLIPTA) 100-25 MCG/INH AEPB Inhale 1 puff into the lungs daily. 07/15/13  Yes Brand Males, MD  furosemide (LASIX) 40 MG tablet Take 1 tablet (40 mg total) by mouth daily. 12/02/13  Yes Tarri Fuller, PA-C  levofloxacin (LEVAQUIN) 500 MG tablet Take 1 tablet (500 mg total) by mouth daily. 12/02/13  Yes Tarri Fuller, PA-C  lovastatin (MEVACOR) 40 MG tablet Take 40 mg by mouth at bedtime.   Yes Historical Provider, MD  Miconazole Nitrate 2 % POWD 1 application by Does not apply route daily. Apply to stomach and under breasts   Yes Historical Provider, MD  mometasone-formoterol (DULERA) 100-5 MCG/ACT AERO Inhale 2 puffs into the lungs 2 (two) times daily.   Yes Historical Provider, MD  montelukast (SINGULAIR) 10 MG tablet Take 10 mg by mouth at bedtime.   Yes Historical Provider, MD  pantoprazole (PROTONIX) 40 MG tablet Take 40 mg by mouth at bedtime.  05/03/11  Yes Historical Provider, MD  potassium chloride SA (K-DUR,KLOR-CON) 20 MEQ tablet Take 1 tablet (20 mEq total) by mouth daily. 12/02/13  Yes Tarri Fuller, PA-C  tiotropium (SPIRIVA) 18 MCG inhalation capsule Place 18 mcg into inhaler and inhale daily.   Yes Historical Provider, MD  methylPREDNIsolone (MEDROL DOSPACK) 4 MG tablet Taper:  6 tablets day one, than 5 tablets day two, 4 tablets day three, 3 tablets day four, 2 tablets day five, 1 tablet day six 12/02/13   Tarri Fuller, PA-C  polyethylene glycol (MIRALAX / GLYCOLAX) packet Take 17 g by mouth daily. 12/02/13   Tarri Fuller, PA-C    Inpatient Medications:  . acetaminophen  1,000 mg Oral 4 times per day  . bisacodyl  10 mg Oral Daily   Or  . bisacodyl  10 mg Rectal Daily  . docusate sodium  200 mg Oral Daily  . enoxaparin (LOVENOX) injection  40 mg Subcutaneous QHS  . furosemide  80 mg Intravenous BID  . mometasone-formoterol  2 puff Inhalation BID  . montelukast  10 mg Oral QHS  . pantoprazole  40 mg Oral Daily  . simvastatin  20 mg Oral q1800  . sodium chloride  3 mL Intravenous Q12H    . tiotropium  18 mcg Inhalation Daily  . warfarin  2.5 mg Oral q1800  . Warfarin - Physician Dosing Inpatient   Does not apply q1800     Allergies:  Allergies  Allergen Reactions  . Codeine Nausea Only  . Prednisone Hives    In different places all over the body.  . Sulfonamide Derivatives Nausea Only    History   Social History  . Marital Status: Married    Spouse Name: N/A    Number of Children: N/A  .  Years of Education: N/A   Occupational History  . retired    Social History Main Topics  . Smoking status: Former Smoker -- 1.50 packs/day for 10 years    Types: Cigarettes    Quit date: 12/27/1974  . Smokeless tobacco: Never Used  . Alcohol Use: No     Comment: rare  . Drug Use: No  . Sexual Activity: No   Other Topics Concern  . Not on file   Social History Narrative   Married 1966   2 sons; 1 grandchild   Retired-[employer]   Doing ok   March '09-financial tough times     Family History  Problem Relation Age of Onset  . Lung cancer Father   . Cancer Father     lung  . Diabetes    . Heart disease    . Heart disease Mother   . Diabetes Mother   . Hypertension Mother   . Colon cancer Neg Hx   . Esophageal cancer Neg Hx   . Stomach cancer Neg Hx   . Rectal cancer Neg Hx      ROS:  Please see the history of present illness.     All other systems reviewed and negative.    Physical Exam:   Blood pressure 99/55, pulse 81, temperature 98.1 F (36.7 C), temperature source Oral, resp. rate 31, height 5\' 4"  (1.626 m), weight 236 lb 5.3 oz (107.2 kg), SpO2 98.00%. General: Well developed, well nourished female in no acute distress. Head: Normocephalic, atraumatic, sclera non-icteric, no xanthomas, nares are without discharge. EENT: normal Lymph Nodes:  none Back: without scoliosis/kyphosis , no CVA tendersness Neck: Negative for carotid bruits. JVD not elevated. Lungs: Clear bilaterally to auscultation without wheezes, rales, or rhonchi. Breathing is  unlabored. Heart: RRR with S1 S2. 2/6 murmur , rubs, or gallops appreciated. Abdomen: Soft, non-tender, non-distended with normoactive bowel sounds. No hepatomegaly. No rebound/guarding. No obvious abdominal masses. Msk:  Strength and tone appear normal for age. Extremities: No clubbing or cyanosis. No edema.  Distal pedal pulses are 2+ and equal bilaterally. Skin: Warm and Dry Neuro: Alert and oriented X 3. CN III-XII intact Grossly normal sensory and motor function . Psych:  Responds to questions appropriately with a normal affect.      Labs: Cardiac Enzymes No results found for this basename: CKTOTAL, CKMB, TROPONINI,  in the last 72 hours CBC Lab Results  Component Value Date   WBC 8.1 12/16/2013   HGB 9.3* 12/16/2013   HCT 28.5* 12/16/2013   MCV 94.4 12/16/2013   PLT 67* 12/16/2013   PROTIME:  Recent Labs  12/14/13 0420 12/15/13 0426 12/16/13 0745  LABPROT 18.2* 15.4* 17.4*  INR 1.55* 1.25 1.47   Chemistry  Recent Labs Lab 12/16/13 0745  NA 139  K 4.0  CL 100  CO2 27  BUN 28*  CREATININE 1.16*  CALCIUM 8.6  GLUCOSE 101*   Lipids Lab Results  Component Value Date   CHOL 181 07/05/2013   HDL 51 07/05/2013   LDLCALC 107* 07/05/2013   TRIG 117 07/05/2013   BNP Pro B Natriuretic peptide (BNP)  Date/Time Value Ref Range Status  12/01/2013  6:25 AM 1068.0* 0 - 125 pg/mL Final  11/28/2013  7:35 AM 3226.0* 0 - 125 pg/mL Final  10/12/2013  2:30 AM 8290.0* 0 - 125 pg/mL Final  10/10/2013  1:12 PM >70000.0* 0 - 125 pg/mL Final   Miscellaneous Lab Results  Component Value Date   DDIMER 2.65* 10/10/2013  Radiology/Studies:  Dg Chest 2 View  12/01/2013   CLINICAL DATA:  CHF and asthma.  EXAM: CHEST  2 VIEW  COMPARISON:  11/28/2013  FINDINGS: There is moderate cardiac enlargement. Calcification of the mitral bowel is identified. There is a moderate right pleural effusion. Chronic interstitial coarsening is identified bilaterally.  IMPRESSION: 1. Cardiac enlargement. 2.  Persistent right pleural effusion.   Electronically Signed   By: Kerby Moors [name]   On: 12/01/2013 11:47   Dg Chest 2 View  11/19/2013   CLINICAL DATA:  Followup pneumonia, shortness of breath, history COPD, hypertension, asthma  EXAM: CHEST  2 VIEW  COMPARISON:  10/22/2013  FINDINGS: Enlargement of cardiac silhouette.  Mediastinal contours and pulmonary vascularity normal.  Minimal right apical pleural thickening stable.  Emphysematous and bronchitic changes consistent with COPD.  Mild residual right basilar infiltrate.  Minimal scarring at left base.  Remaining lungs clear.  No pneumothorax.  Question minimal right pleural effusion.  Mitral annular calcification again noted.  IMPRESSION: COPD changes with mild residual infiltrate at right base and question minimal right pleural effusion.   Electronically Signed   By: Lavonia Dana [name]   On: 11/19/2013 16:07   Dg Chest Port 1 View  12/16/2013   CLINICAL DATA:  Postsurgical evaluation.  EXAM: PORTABLE CHEST - 1 VIEW  COMPARISON:  DG CHEST 1V PORT dated 12/14/2013; DG CHEST 1V PORT dated 12/13/2013; DG CHEST 1V PORT dated 12/12/2013  FINDINGS: Right IJ sheath stable in position. Stable enlarged cardiac and mediastinal contours status post median sternotomy. Small layering bilateral pleural effusions with underlying heterogeneous pulmonary opacities. Unchanged pulmonary vascular congestion. No definite pneumothorax.  IMPRESSION: Small pleural effusions and underlying atelectasis.  Unchanged pulmonary vascular congestion.   Electronically Signed   By: Lovey Newcomer [name]   On: 12/16/2013 08:05   Dg Chest Port 1 View  12/14/2013   CLINICAL DATA:  Postop cardiac surgery  EXAM: PORTABLE CHEST - 1 VIEW  COMPARISON:  12/13/2013  FINDINGS: There is a right IJ catheter sheath with tip in the projection of the SVC. There are postoperative changes compatible with median sternotomy and aortic valve replacement. There are small bilateral pleural effusions, pulmonary vascular  congestion and bibasilar atelectasis which appears similar to previous exam. No pneumothorax identified.  IMPRESSION: 1. No change in aeration to the lungs compared with previous exam.   Electronically Signed   By: Kerby Moors [name]   On: 12/14/2013 08:10   Dg Chest Portable 1 View In Am  12/13/2013   CLINICAL DATA:  Extubated  EXAM: PORTABLE CHEST - 1 VIEW  COMPARISON:  Prior chest x-ray 12/12/2013  FINDINGS: Interval extubation and removal of nasogastric tube. Swan-Ganz catheter remains in unchanged position with the tip in the main pulmonary outflow tract. Slightly increased pulmonary vascular congestion and a developing bilateral pleural effusions with associated bibasilar atelectasis. Stable cardiac and mediastinal contours. No pneumothorax.  IMPRESSION: 1. Increased pulmonary vascular congestion now with mild interstitial edema. 2. Developing right slightly larger than left pleural effusions with associated bibasilar atelectasis. 3. Interval extubation and removal of nasogastric tube. Other support apparatus in stable and satisfactory position.   Electronically Signed   By: Jacqulynn Cadet [name]   On: 12/13/2013 08:09   Dg Chest Portable 1 View  12/12/2013   CLINICAL DATA:  Postoperative radiograph.  EXAM: PORTABLE CHEST - 1 VIEW  COMPARISON:  DG CHEST 2 VIEW dated 12/01/2013  FINDINGS: Interval intubation. Tip of the endotracheal tube appears to be 45  mm from the carina. Right IJ vascular sheath transmits a Swan-Ganz catheter. The tip is in the pulmonary outflow tract. Enteric tube is present. Mediastinal drains. New bioprosthetic bowels, 1 of which appears to be mitral and 1 aortic. Monitoring leads project over the chest.  No pneumothorax.  Basilar atelectasis.  IMPRESSION: 1. Support apparatus in good position. 2. New mitral and probable aortic valve replacements. 3. Basilar atelectasis.   Electronically Signed   By: Dereck Ligas [name]   On: 12/12/2013 16:41   Dg Chest Port 1 View  11/28/2013    CLINICAL DATA:  Shortness of breath.  EXAM: PORTABLE CHEST - 1 VIEW  COMPARISON:  PA and lateral chest 11/19/2013 and 10/22/2013.  FINDINGS: Small right effusion and basilar airspace disease are again seen. The left lung is clear. Heart size is upper normal. No pneumothorax.  IMPRESSION: No marked change in a small right effusion and basilar airspace disease.   Electronically Signed   By: Inge Rise [name]   On: 11/28/2013 07:31    Tel P-synchronous/ AV  pacing Interrogation  Occasional conducted beat mostly junctional escape  Assessment and Plan:   High grade but not complete heart block Preop RBBB S/p MVR/AVR with heavy calcium Normal LV function   May need pacing Will watch for day or two and see what happens as there is an occasional conducted beat at thhis time  Reviewed with Pt and Dr Thermon Leyland who will see in am [name]

## 2013-12-16 NOTE — Anesthesia Postprocedure Evaluation (Signed)
  Anesthesia Post-op Note  Patient: [name]  Procedure(s) Performed: Procedure(s): AORTIC VALVE REPLACEMENT (AVR) (N/A) MITRAL VALVE (MV) REPLACEMENT OR REPAIR (N/A) INTRAOPERATIVE TRANSESOPHAGEAL ECHOCARDIOGRAM (N/A)  Patient Location: SICU  Anesthesia Type:General  Level of Consciousness: alert  and oriented  Airway and Oxygen Therapy: Patient Spontanous Breathing and Patient connected to nasal cannula oxygen  Post-op Pain: none  Post-op Assessment: Post-op Vital signs reviewed, Patient's Cardiovascular Status Stable, Respiratory Function Stable, Patent Airway, No signs of Nausea or vomiting, Adequate PO intake and Pain level controlled  Post-op Vital Signs: Reviewed and stable  Complications: No apparent anesthesia complications

## 2013-12-16 NOTE — Progress Notes (Signed)
4 Days Post-Op Procedure(s) (LRB): AORTIC VALVE REPLACEMENT (AVR) (N/A) MITRAL VALVE (MV) REPLACEMENT OR REPAIR (N/A) INTRAOPERATIVE TRANSESOPHAGEAL ECHOCARDIOGRAM (N/A) Subjective:  Feels tired. Walked 100 ft this am but had to stoop a few times  Objective: Vital signs in last 24 hours: Temp:  [97.5 F (36.4 C)-98.6 F (37 C)] 98.1 F (36.7 C) (03/09 0747) Pulse Rate:  [79-108] 80 (03/09 0700) Cardiac Rhythm:  Sinus with complete heart block, slow junctional escape Resp:  [15-26] 26 (03/09 0700) BP: (97-135)/(48-68) 99/55 mmHg (03/09 0700) SpO2:  [92 %-100 %] 99 % (03/09 0700) Weight:  [107.2 kg (236 lb 5.3 oz)] 107.2 kg (236 lb 5.3 oz) (03/09 0438)  Hemodynamic parameters for last 24 hours:    Intake/Output from previous day: 03/08 0701 - 03/09 0700 In: 848 [P.O.:360; I.V.:488] Out: 1003 [Urine:1000; Stool:3] Intake/Output this shift:    General appearance: alert and cooperative Neurologic: intact Heart: regular rate and rhythm Lungs: diminished breath sounds bibasilar Extremities: edema mild Wound: incision ok  Lab Results:  Recent Labs  12/14/13 0420 12/15/13 0426  WBC 11.9* 10.9*  HGB 9.4* 9.1*  HCT 28.4* 28.4*  PLT 55* 61*   BMET:  Recent Labs  12/14/13 0420 12/15/13 0426  NA 137 136*  K 4.2 4.2  CL 102 100  CO2 25 27  GLUCOSE 115* 100*  BUN 19 23  CREATININE 0.89 0.86  CALCIUM 8.4 8.6    PT/INR:  Recent Labs  12/15/13 0426  LABPROT 15.4*  INR 1.25   ABG    Component Value Date/Time   PHART 7.274* 12/12/2013 2354   HCO3 24.8* 12/12/2013 2354   TCO2 25 12/13/2013 1641   ACIDBASEDEF 2.0 12/12/2013 2354   O2SAT 96.0 12/12/2013 2354   CBG (last 3)   Recent Labs  12/15/13 0634 12/15/13 1150 12/15/13 1638  GLUCAP 107* 99 110*    Assessment/Plan: S/P Procedure(s) (LRB): AORTIC VALVE REPLACEMENT (AVR) (N/A) MITRAL VALVE (MV) REPLACEMENT OR REPAIR (N/A) INTRAOPERATIVE TRANSESOPHAGEAL ECHOCARDIOGRAM (N/A) She remains hemodynamically  stable with SBP only 100-110 Remains in complete heart block since surgery. She had such extreme annular calcification that this is not coming back. She will need a permanent pacer. Will consult EP. Volume excess: Wt is 23 lbs over preop if accurate. Continue diuresis as BP allows. Anticoagulation: INR pending this am. Will keep less than 2 until pacer placed. Thrombocytopenia: improving trend. Mobilize    LOS: 4 days    BARTLE,BRYAN K 12/16/2013

## 2013-12-17 ENCOUNTER — Encounter (HOSPITAL_COMMUNITY)
Admission: RE | Disposition: A | Discharge status: Home / Self Care | Payer: Medicare Other | Source: Ambulatory Visit | Attending: Surgery

## 2013-12-17 ENCOUNTER — Ambulatory Visit: Payer: Medicare Other | Admitting: Cardiovascular Disease

## 2013-12-17 LAB — PROTIME-INR
INR: 1.34 (ref 0.00–1.49)
Prothrombin Time: 16.3 seconds — ABNORMAL HIGH (ref 11.6–15.2)

## 2013-12-17 SURGERY — PERMANENT PACEMAKER INSERTION
Anesthesia: LOCAL

## 2013-12-17 MED ORDER — POTASSIUM CHLORIDE CRYS ER 20 MEQ PO TBCR
40.0000 meq | EXTENDED_RELEASE_TABLET | Freq: Two times a day (BID) | ORAL | Status: AC
  Administered 2013-12-17 (×2): 40 meq via ORAL
  Filled 2013-12-17 (×2): qty 2

## 2013-12-17 NOTE — Progress Notes (Signed)
Patient ID: [name], female   DOB: [birth year], 68 y.o.   MRN: [id number]  SICU Evening Rounds:  Hemodynamically stable  AV paced.  Excellent urine output  Ambulated well today.

## 2013-12-17 NOTE — Progress Notes (Signed)
5 Days Post-Op Procedure(s) (LRB): AORTIC VALVE REPLACEMENT (AVR) (N/A) MITRAL VALVE (MV) REPLACEMENT OR REPAIR (N/A) INTRAOPERATIVE TRANSESOPHAGEAL ECHOCARDIOGRAM (N/A) Subjective: Feels better today. Walked farther this morning.  Objective: Vital signs in last 24 hours: Temp:  [97.8 F (36.6 C)-99.3 F (37.4 C)] 99.3 F (37.4 C) (03/10 0719) Pulse Rate:  [81-108] 98 (03/10 0700) Cardiac Rhythm:  [-] A-V Sequential paced (03/10 0600) Resp:  [12-33] 19 (03/10 0700) BP: (88-140)/(42-68) 140/68 mmHg (03/10 0700) SpO2:  [95 %-100 %] 99 % (03/10 0722) Weight:  [105.87 kg (233 lb 6.4 oz)] 105.87 kg (233 lb 6.4 oz) (03/10 0500)  Hemodynamic parameters for last 24 hours:    Intake/Output from previous day: 03/09 0701 - 03/10 0700 In: 1205 [P.O.:900; I.V.:305] Out: 2652 [Urine:2650; Stool:2] Intake/Output this shift:    General appearance: alert and cooperative Neurologic: intact Heart: regular rate and rhythm, S1, S2 normal, no murmur, click, rub or gallop Lungs: diminished breath sounds bibasilar Extremities: edema mild Wound: incision ok  Lab Results:  Recent Labs  12/15/13 0426 12/16/13 0745  WBC 10.9* 8.1  HGB 9.1* 9.3*  HCT 28.4* 28.5*  PLT 61* 67*   BMET:  Recent Labs  12/15/13 0426 12/16/13 0745  NA 136* 139  K 4.2 4.0  CL 100 100  CO2 27 27  GLUCOSE 100* 101*  BUN 23 28*  CREATININE 0.86 1.16*  CALCIUM 8.6 8.6    PT/INR:  Recent Labs  12/17/13 0235  LABPROT 16.3*  INR 1.34   ABG    Component Value Date/Time   PHART 7.274* 12/12/2013 2354   HCO3 24.8* 12/12/2013 2354   TCO2 25 12/13/2013 1641   ACIDBASEDEF 2.0 12/12/2013 2354   O2SAT 96.0 12/12/2013 2354   CBG (last 3)   Recent Labs  12/15/13 0634 12/15/13 1150 12/15/13 1638  GLUCAP 107* 99 110*    Assessment/Plan: S/P Procedure(s) (LRB): AORTIC VALVE REPLACEMENT (AVR) (N/A) MITRAL VALVE (MV) REPLACEMENT OR REPAIR (N/A) INTRAOPERATIVE TRANSESOPHAGEAL ECHOCARDIOGRAM  (N/A) Hemodynamically stable with increased BP.  Mobilize Diuresis Continue DDD pacing at 80 for now. Cardiology following rhythm and will decide about need for a PPM.   LOS: 5 days    BARTLE,BRYAN K 12/17/2013

## 2013-12-17 NOTE — Progress Notes (Signed)
With native sinus rate of 49-50 bpm, [name]'s AV node conducts with 2nd degree AV block, Mobitz type 1, roughly 7-8 out of every 10 beats. At programmed atrial rate of 80 bpm there is high grade AV block. Findings are encouraging for AV node recovery. Will delay decision re: permanent pacemaker for another day. Discussed with Dr. Lovena Le.  Sanda Klein, MD, [facility] [phone] office [phone] pager

## 2013-12-18 ENCOUNTER — Encounter (HOSPITAL_COMMUNITY): Payer: Self-pay | Admitting: Internal Medicine

## 2013-12-18 DIAGNOSIS — I442 Atrioventricular block, complete: Secondary | ICD-10-CM

## 2013-12-18 LAB — BASIC METABOLIC PANEL
BUN: 21 mg/dL (ref 6–23)
CO2: 31 mEq/L (ref 19–32)
Calcium: 8.6 mg/dL (ref 8.4–10.5)
Chloride: 97 mEq/L (ref 96–112)
Creatinine, Ser: 1.03 mg/dL (ref 0.50–1.10)
GFR calc Af Amer: 63 mL/min — ABNORMAL LOW (ref >90)
GFR calc non Af Amer: 55 mL/min — ABNORMAL LOW (ref >90)
Glucose, Bld: 93 mg/dL (ref 70–99)
Potassium: 3.7 mEq/L (ref 3.7–5.3)
Sodium: 138 mEq/L (ref 137–147)

## 2013-12-18 LAB — CBC
HCT: 27.3 % — ABNORMAL LOW (ref 36.0–46.0)
Hemoglobin: 8.8 g/dL — ABNORMAL LOW (ref 12.0–15.0)
MCH: 30.4 pg (ref 26.0–34.0)
MCHC: 32.2 g/dL (ref 30.0–36.0)
MCV: 94.5 fL (ref 78.0–100.0)
Platelets: 70 10*3/uL — ABNORMAL LOW (ref 150–400)
RBC: 2.89 MIL/uL — ABNORMAL LOW (ref 3.87–5.11)
RDW: 15.2 % (ref 11.5–15.5)
WBC: 5.8 10*3/uL (ref 4.0–10.5)

## 2013-12-18 LAB — PROTIME-INR
INR: 1.12 (ref 0.00–1.49)
Prothrombin Time: 14.2 seconds (ref 11.6–15.2)

## 2013-12-18 MED ORDER — SODIUM CHLORIDE 0.9 % IJ SOLN
3.0000 mL | Freq: Two times a day (BID) | INTRAMUSCULAR | Status: DC
  Administered 2013-12-18 – 2013-12-24 (×12): 3 mL via INTRAVENOUS

## 2013-12-18 MED ORDER — ONDANSETRON HCL 4 MG PO TABS: 4.0000 mg | ORAL_TABLET | Freq: Four times a day (QID) | ORAL | Status: DC | PRN

## 2013-12-18 MED ORDER — DOCUSATE SODIUM 100 MG PO CAPS
200.0000 mg | ORAL_CAPSULE | Freq: Every day | ORAL | Status: DC
  Filled 2013-12-18: qty 2

## 2013-12-18 MED ORDER — BISACODYL 5 MG PO TBEC: 10.0000 mg | DELAYED_RELEASE_TABLET | Freq: Every day | ORAL | Status: DC | PRN

## 2013-12-18 MED ORDER — CHLORHEXIDINE GLUCONATE 4 % EX LIQD
60.0000 mL | Freq: Once | CUTANEOUS | Status: AC
  Administered 2013-12-18: 4 via TOPICAL
  Filled 2013-12-18 (×2): qty 60

## 2013-12-18 MED ORDER — SODIUM CHLORIDE 0.9 % IV SOLN
250.0000 mL | INTRAVENOUS | Status: DC | PRN
  Administered 2013-12-20: 30 mL via INTRAVENOUS

## 2013-12-18 MED ORDER — WARFARIN SODIUM 7.5 MG PO TABS
7.5000 mg | ORAL_TABLET | Freq: Once | ORAL | Status: AC
  Administered 2013-12-18: 7.5 mg via ORAL
  Filled 2013-12-18: qty 1

## 2013-12-18 MED ORDER — MOVING RIGHT ALONG BOOK
Freq: Once | Status: AC
  Administered 2013-12-18: 15:00:00
  Filled 2013-12-18: qty 1

## 2013-12-18 MED ORDER — ACETAMINOPHEN 325 MG PO TABS: 650.0000 mg | ORAL_TABLET | Freq: Four times a day (QID) | ORAL | Status: DC | PRN

## 2013-12-18 MED ORDER — TRAMADOL HCL 50 MG PO TABS
50.0000 mg | ORAL_TABLET | ORAL | Status: DC | PRN
  Administered 2013-12-22 – 2013-12-23 (×3): 100 mg via ORAL
  Filled 2013-12-18 (×3): qty 2

## 2013-12-18 MED ORDER — FUROSEMIDE 80 MG PO TABS
80.0000 mg | ORAL_TABLET | Freq: Every day | ORAL | Status: DC
  Administered 2013-12-18 – 2013-12-19 (×2): 80 mg via ORAL
  Filled 2013-12-18 (×3): qty 1

## 2013-12-18 MED ORDER — PANTOPRAZOLE SODIUM 40 MG PO TBEC
40.0000 mg | DELAYED_RELEASE_TABLET | Freq: Every day | ORAL | Status: DC
  Administered 2013-12-19 – 2013-12-24 (×6): 40 mg via ORAL
  Filled 2013-12-18 (×6): qty 1

## 2013-12-18 MED ORDER — SODIUM CHLORIDE 0.9 % IV SOLN: INTRAVENOUS | Status: DC

## 2013-12-18 MED ORDER — POTASSIUM CHLORIDE CRYS ER 20 MEQ PO TBCR
20.0000 meq | EXTENDED_RELEASE_TABLET | Freq: Two times a day (BID) | ORAL | Status: DC
  Administered 2013-12-18 – 2013-12-20 (×6): 20 meq via ORAL
  Filled 2013-12-18 (×8): qty 1

## 2013-12-18 MED ORDER — BISACODYL 10 MG RE SUPP: 10.0000 mg | Freq: Every day | RECTAL | Status: DC | PRN

## 2013-12-18 MED ORDER — SODIUM CHLORIDE 0.9 % IJ SOLN: 3.0000 mL | INTRAMUSCULAR | Status: DC | PRN

## 2013-12-18 MED ORDER — CHLORHEXIDINE GLUCONATE 4 % EX LIQD
60.0000 mL | Freq: Once | CUTANEOUS | Status: AC
  Administered 2013-12-18: 4 via TOPICAL
  Filled 2013-12-18: qty 60

## 2013-12-18 MED ORDER — POTASSIUM CHLORIDE CRYS ER 20 MEQ PO TBCR
40.0000 meq | EXTENDED_RELEASE_TABLET | Freq: Once | ORAL | Status: AC
  Administered 2013-12-18: 40 meq via ORAL

## 2013-12-18 MED ORDER — POTASSIUM CHLORIDE CRYS ER 10 MEQ PO TBCR
EXTENDED_RELEASE_TABLET | ORAL | Status: AC
  Filled 2013-12-18: qty 4

## 2013-12-18 MED ORDER — ONDANSETRON HCL 4 MG/2ML IJ SOLN
4.0000 mg | Freq: Four times a day (QID) | INTRAMUSCULAR | Status: DC | PRN
  Administered 2013-12-22: 4 mg via INTRAVENOUS
  Filled 2013-12-18: qty 2

## 2013-12-18 MED ORDER — OXYCODONE HCL 5 MG PO TABS
5.0000 mg | ORAL_TABLET | ORAL | Status: DC | PRN
  Administered 2013-12-20 – 2013-12-21 (×2): 5 mg via ORAL
  Filled 2013-12-18 (×2): qty 1

## 2013-12-18 NOTE — Progress Notes (Signed)
CARDIAC REHAB PHASE I   PRE:  Rate/Rhythm: 103 ST    BP: sitting 119/70    SaO2: 96 2L  MODE:  Ambulation: 450 ft   POST:  Rate/Rhythm: 122 ST    BP: sitting 119/56     SaO2: 96 2L in hall, 92-94 2L after walk  Pt eager to walk. Used RW, assist x2 and 2L.  Steady, motivated. Sat to rest at end of hall due to fatigue and SOB. Return to recliner. HR elevated but stable. Will f/u. [id number]   Josephina Shih [place] CES, ACSM 12/18/2013 3:25 PM

## 2013-12-18 NOTE — Progress Notes (Signed)
Attempted to call report x 1. 2W RN to call 2S RN when ready for report.  Detrice Cales L

## 2013-12-18 NOTE — Progress Notes (Signed)
Report called to [name] RN. Pt to transfer to 2W-05 via wheelchair, meds in chart, VS stable, family and belongings at bedside. No questions or complaints at this time.  [name]

## 2013-12-18 NOTE — Progress Notes (Addendum)
In to see patient at this time, pre-op workup conducted for possible pacemaker placement in am. Pacemaker turned off. Patient to be found in NSR at this current time with rates in the 90-95 range. 12 Lead EKG done and EKG strip placed in chart. Patient vital signs stable and WNL with pacemaker unattached. Patient placed on backup of 60 AAI incase of rhythm change. Patient currently resting in chair. Will continue to monitor the patient closely.

## 2013-12-18 NOTE — Progress Notes (Signed)
6 Days Post-Op Procedure(s) (LRB): AORTIC VALVE REPLACEMENT (AVR) (N/A) MITRAL VALVE (MV) REPLACEMENT OR REPAIR (N/A) INTRAOPERATIVE TRANSESOPHAGEAL ECHOCARDIOGRAM (N/A) Subjective: No complaints  Objective: Vital signs in last 24 hours: Temp:  [97.6 F (36.4 C)-98 F (36.7 C)] 97.6 F (36.4 C) (03/11 0748) Pulse Rate:  [93-106] 102 (03/11 0700) Cardiac Rhythm:  [-] Sinus tachycardia (03/11 0700) Resp:  [16-27] 20 (03/11 0700) BP: (84-129)/(46-72) 119/49 mmHg (03/11 0700) SpO2:  [94 %-100 %] 96 % (03/11 0700) Weight:  [104.5 kg (230 lb 6.1 oz)] 104.5 kg (230 lb 6.1 oz) (03/11 0400)  Rhythm is sinus with first degree AV block. Rate 100     Intake/Output from previous day: 03/10 0701 - 03/11 0700 In: 530 [P.O.:480; I.V.:50] Out: 3350 [Urine:3350] Intake/Output this shift: Total I/O In: -  Out: 200 [Urine:200]  General appearance: alert and cooperative Neurologic: intact Heart: regular rate and rhythm, S1, S2 normal, no murmur, click, rub or gallop Lungs: clear to auscultation bilaterally Extremities: edema mild Wound: incision ok  Lab Results:  Recent Labs  12/16/13 0745 12/18/13 0316  WBC 8.1 5.8  HGB 9.3* 8.8*  HCT 28.5* 27.3*  PLT 67* 70*   BMET:  Recent Labs  12/16/13 0745 12/18/13 0316  NA 139 138  K 4.0 3.7  CL 100 97  CO2 27 31  GLUCOSE 101* 93  BUN 28* 21  CREATININE 1.16* 1.03  CALCIUM 8.6 8.6    PT/INR:  Recent Labs  12/18/13 0316  LABPROT 14.2  INR 1.12   ABG    Component Value Date/Time   PHART 7.274* 12/12/2013 2354   HCO3 24.8* 12/12/2013 2354   TCO2 25 12/13/2013 1641   ACIDBASEDEF 2.0 12/12/2013 2354   O2SAT 96.0 12/12/2013 2354   CBG (last 3)   Recent Labs  12/15/13 1150 12/15/13 1638  GLUCAP 99 110*    Assessment/Plan: S/P Procedure(s) (LRB): AORTIC VALVE REPLACEMENT (AVR) (N/A) MITRAL VALVE (MV) REPLACEMENT OR REPAIR (N/A) INTRAOPERATIVE TRANSESOPHAGEAL ECHOCARDIOGRAM (N/A) She remains hemodynamically stable. Rhythm  is sinus with first degree block this am. Will switch pacer to backup VVI 60. No beta blocker for now. INR has not bumped. Will give 7.5 coumadin tonight. Mobilize Diuresis: weight down 3 more lbs but still 27 lbs over preop if accurate preop wt. Plan for transfer to step-down: see transfer orders   LOS: 6 days    BARTLE,BRYAN K 12/18/2013

## 2013-12-18 NOTE — Progress Notes (Signed)
Patient Name: [name]      SUBJECTIVE:*feeling better  Past Medical History  Diagnosis Date  . IBS (irritable bowel syndrome)   . Hyperlipidemia   . Allergic rhinitis   . History of pneumonia   . COPD (chronic obstructive pulmonary disease)   . Asthma   . Heart murmur     Aortic  . Lung nodule     RLL nodule-75mm stable 2006, April 2009, and June 2009  . Candidiasis of breast   . Anemia   . Hyperlipemia   . Obesity   . Unspecified essential hypertension   . Heart valve problem     PER CARDIOLOGIST 05-03-2011  . Sleep difficulties   . Hemorrhoids   . Sleep apnea     PT TOLD BORDERLINE-TRIED CPAP-DID NOT HELP-SHE DOES NOT USE CPAP  . Arthritis   . 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 History of radiation therapy 05/30/11 to 07/07/11    rectum  . Prophylactic chemotherapy     Will finish chemo on 03/04/12  . Pneumonia     hx of several times   . Shingles   . rectal ca 04/2011  . Severe aortic valve stenosis 10/01/2013  . Shortness of breath   . Rheumatic heart disease   . PONV (postoperative nausea and vomiting)   . Anxiety     Scheduled Meds:  Scheduled Meds: . bisacodyl  10 mg Oral Daily   Or  . bisacodyl  10 mg Rectal Daily  . docusate sodium  200 mg Oral Daily  . enoxaparin (LOVENOX) injection  40 mg Subcutaneous QHS  . furosemide  80 mg Intravenous BID  . mometasone-formoterol  2 puff Inhalation BID  . montelukast  10 mg Oral QHS  . pantoprazole  40 mg Oral Daily  . simvastatin  20 mg Oral q1800  . sodium chloride  3 mL Intravenous Q12H  . tiotropium  18 mcg Inhalation Daily  . warfarin  2.5 mg Oral q1800  . Warfarin - Physician Dosing Inpatient   Does not apply q1800   Continuous Infusions: . sodium chloride 250 mL (12/17/13 0700)  . sodium chloride    . sodium chloride      PHYSICAL EXAM Filed Vitals:   12/18/13 0700 12/18/13 0748 12/18/13 0800 12/18/13 0828  BP: 119/49  99/57   Pulse: 102  101   Temp:  97.6 F (36.4 C)      TempSrc:  Oral    Resp: 20  13   Height:      Weight:      SpO2: 96%  97% 96%      TELEMETRY: Reviewed telemetry pt in NSR     Intake/Output Summary (Last 24 hours) at 12/18/13 0831 Last data filed at 12/18/13 0800  Gross per 24 hour  Intake    520 ml  Output   3625 ml  Net  -3105 ml    LABS: Basic Metabolic Panel:  Recent Labs Lab 12/12/13 2105  12/13/13 0407 12/13/13 1640 12/13/13 1641 12/14/13 0420 12/15/13 0426 12/16/13 0745 12/18/13 0316  NA 141  --  140  --  137 137 136* 139 138  K 4.0  --  4.4  --  3.9 4.2 4.2 4.0 3.7  CL 102  --  106  --  103 102 100 100 97  CO2  --   --  24  --   --  25 27 27 31   GLUCOSE 143*  --  108*  --  112* 115* 100* 101* 93  BUN 17  --  18  --  18 19 23  28* 21  CREATININE 0.80  < > 0.86 0.93 1.10 0.89 0.86 1.16* 1.03  CALCIUM  --   < > 7.9*  --   --  8.4 8.6 8.6 8.6  MG  --   < > 2.7* 2.5  --   --   --   --   --   < > = values in this interval not displayed. Cardiac Enzymes: No results found for this basename: CKTOTAL, CKMB, CKMBINDEX, TROPONINI,  in the last 72 hours CBC:  Recent Labs Lab 12/12/13 2109 12/13/13 0407 12/13/13 1640 12/13/13 1641 12/14/13 0420 12/15/13 0426 12/16/13 0745 12/18/13 0316  WBC 16.7* 20.1* 15.9*  --  11.9* 10.9* 8.1 5.8  HGB 8.8* 10.0* 10.3* 10.2* 9.4* 9.1* 9.3* 8.8*  HCT 26.5* 30.3* 30.8* 30.0* 28.4* 28.4* 28.5* 27.3*  MCV 91.7 91.8 92.8  --  94.0 94.0 94.4 94.5  PLT 120* 129* 71*  --  55* 61* 67* 70*   PROTIME:  Recent Labs  12/16/13 0745 12/17/13 0235 12/18/13 0316  LABPROT 17.4* 16.3* 14.2  INR 1.47 1.34 1.12   Liver Function Tests: No results found for this basename: AST, ALT, ALKPHOS, BILITOT, PROT, ALBUMIN,  in the last 72 hours No results found for this basename: LIPASE, AMYLASE,  in the last 72 hours BNP: BNP (last 3 results)  Recent Labs  10/12/13 0230 11/28/13 0735 12/01/13 0625  PROBNP 8290.0* 3226.0* 1068.0*     ASSESSMENT AND PLAN:  Active Problems:    S/P MVR (mitral valve replacement)   Complete heart block  Intrinsic conduction resumed Will sign off callfor questions  Signed, Virl Axe MD  12/18/2013

## 2013-12-18 NOTE — Progress Notes (Signed)
Bedside handoff to Vivia Budge, 2W RN.

## 2013-12-19 ENCOUNTER — Other Ambulatory Visit: Payer: Self-pay

## 2013-12-19 LAB — PROTIME-INR
INR: 1.22 (ref 0.00–1.49)
Prothrombin Time: 15.1 seconds (ref 11.6–15.2)

## 2013-12-19 MED ORDER — WARFARIN SODIUM 5 MG PO TABS
5.0000 mg | ORAL_TABLET | Freq: Once | ORAL | Status: AC
  Administered 2013-12-19: 5 mg via ORAL
  Filled 2013-12-19: qty 1

## 2013-12-19 MED ORDER — DOCUSATE SODIUM 100 MG PO CAPS: 200.0000 mg | ORAL_CAPSULE | Freq: Every day | ORAL | Status: DC | PRN

## 2013-12-19 MED ORDER — METOPROLOL TARTRATE 25 MG PO TABS
25.0000 mg | ORAL_TABLET | Freq: Two times a day (BID) | ORAL | Status: DC
  Administered 2013-12-19 – 2013-12-20 (×3): 25 mg via ORAL
  Filled 2013-12-19 (×5): qty 1

## 2013-12-19 NOTE — Progress Notes (Addendum)
      [address]       [place] [address]             [phone]      7 Days Post-Op Procedure(s) (LRB): AORTIC VALVE REPLACEMENT (AVR) (N/A) MITRAL VALVE (MV) REPLACEMENT OR REPAIR (N/A) INTRAOPERATIVE TRANSESOPHAGEAL ECHOCARDIOGRAM (N/A)  Subjective:  [name] complains of "loose" bowels this morning.  She states that every time she goes to the bathroom she leaks stool.  She asks that stool softners are discontinued.    Objective: Vital signs in last 24 hours: Temp:  [97.7 F (36.5 C)-98.4 F (36.9 C)] 97.7 F (36.5 C) (03/12 0523) Pulse Rate:  [100-116] 116 (03/12 0523) Cardiac Rhythm:  [-] Normal sinus rhythm (03/11 2024) Resp:  [13-21] 18 (03/12 0523) BP: (99-117)/(56-79) 117/56 mmHg (03/12 0523) SpO2:  [96 %-97 %] 97 % (03/12 0523) Weight:  [226 lb 8 oz (102.74 kg)] 226 lb 8 oz (102.74 kg) (03/12 0523)  Intake/Output from previous day: 03/11 0701 - 03/12 0700 In: 480 [P.O.:480] Out: 2225 [Urine:2225]  General appearance: alert, cooperative and no distress Heart: regular rate and rhythm Lungs: clear to auscultation bilaterally Abdomen: soft, non-tender; bowel sounds normal; no masses,  no organomegaly Extremities: edema trace Wound: clean and dry  Lab Results:  Recent Labs  12/18/13 0316  WBC 5.8  HGB 8.8*  HCT 27.3*  PLT 70*   BMET:  Recent Labs  12/18/13 0316  NA 138  K 3.7  CL 97  CO2 31  GLUCOSE 93  BUN 21  CREATININE 1.03  CALCIUM 8.6    PT/INR:  Recent Labs  12/19/13 0305  LABPROT 15.1  INR 1.22   ABG    Component Value Date/Time   PHART 7.274* 12/12/2013 2354   HCO3 24.8* 12/12/2013 2354   TCO2 25 12/13/2013 1641   ACIDBASEDEF 2.0 12/12/2013 2354   O2SAT 96.0 12/12/2013 2354   CBG (last 3)  No results found for this basename: GLUCAP,  in the last 72 hours  Assessment/Plan: S/P Procedure(s) (LRB): AORTIC VALVE REPLACEMENT (AVR) (N/A) MITRAL VALVE (MV) REPLACEMENT OR REPAIR (N/A) INTRAOPERATIVE  TRANSESOPHAGEAL ECHOCARDIOGRAM (N/A)  1. CV- previous CHB, resolved, currently Sinus Tach rate 110's, pressure controlled- Beta Blocker previously being held, will monitor HR, but may need to start low dose if patient will tolerate 2. INR 1.22- will repeat 7.5 mg tonight 3. Pulm- wean oxygen as tolerated, encouraged use of IS 4. Renal- remains volume overloaded, weight is up 13 lbs since admission, continue diuresis 5. Dispo- patient stable, patient overriding temporary pacemaker, possibly remove EPW tomorrow   LOS: 7 days    BARRETT, ERIN 12/19/2013  Her HR today is 120's at rest and 130's with ambulation. I can't tell if this is sinus or atrial flutter on the monitor. Will get an ECG. Start lopressor as BP allows to see if this will slow her rate.

## 2013-12-19 NOTE — Progress Notes (Signed)
CARDIAC REHAB PHASE I   PRE:  Rate/Rhythm: 122 ST    BP: sitting 94/55    SaO2: 95 2L  MODE:  Ambulation: 450 ft   POST:  Rate/Rhythm: 132 ST    BP: sitting 128/53     SaO2: 94 2L  Pt HR elevated today. Maintains 122 ST in room, up to 132 with walking. Steady with RW, 2L, assist x2. Stood to rest x3, sat to rest x1. Tired after walk. Will f/u. This was first walk today. Encouraged x2 more. [phone]  Josephina Shih [place] CES, ACSM 12/19/2013 3:35 PM

## 2013-12-19 NOTE — Progress Notes (Signed)
Patient evaluated for community based chronic disease management services with [facility] as a benefit of patient's [employer]. Spoke with patient and spouse at bedside to explain [facility] services.  Patient would like to discuss services with her cardiologist prior to engagement.  Left contact information and [facility] literature at bedside. Made Inpatient Case Manager aware that [facility] following. Of note, [facility] services does not replace or interfere with any services that are arranged by inpatient case management or social work.  For additional questions or referrals please contact Corliss Blacker BSN RN [facility]ia[facility] at [phone].

## 2013-12-20 ENCOUNTER — Inpatient Hospital Stay (HOSPITAL_COMMUNITY): Payer: Medicare Other

## 2013-12-20 LAB — BASIC METABOLIC PANEL
BUN: 18 mg/dL (ref 6–23)
CO2: 33 mEq/L — ABNORMAL HIGH (ref 19–32)
Calcium: 9.2 mg/dL (ref 8.4–10.5)
Chloride: 96 mEq/L (ref 96–112)
Creatinine, Ser: 0.97 mg/dL (ref 0.50–1.10)
GFR calc Af Amer: 68 mL/min — ABNORMAL LOW (ref >90)
GFR calc non Af Amer: 59 mL/min — ABNORMAL LOW (ref >90)
Glucose, Bld: 103 mg/dL — ABNORMAL HIGH (ref 70–99)
Potassium: 4.2 mEq/L (ref 3.7–5.3)
Sodium: 139 mEq/L (ref 137–147)

## 2013-12-20 LAB — PROTIME-INR
INR: 1.36 (ref 0.00–1.49)
Prothrombin Time: 16.4 seconds — ABNORMAL HIGH (ref 11.6–15.2)

## 2013-12-20 MED ORDER — FUROSEMIDE 10 MG/ML IJ SOLN
40.0000 mg | Freq: Once | INTRAMUSCULAR | Status: AC
  Administered 2013-12-20: 40 mg via INTRAVENOUS
  Filled 2013-12-20: qty 4

## 2013-12-20 MED ORDER — ASPIRIN 81 MG PO CHEW
81.0000 mg | CHEWABLE_TABLET | Freq: Every day | ORAL | Status: DC
  Administered 2013-12-20 – 2013-12-22 (×3): 81 mg via ORAL
  Filled 2013-12-20 (×3): qty 1

## 2013-12-20 MED ORDER — METOLAZONE 10 MG PO TABS
10.0000 mg | ORAL_TABLET | Freq: Once | ORAL | Status: AC
  Administered 2013-12-20: 10 mg via ORAL
  Filled 2013-12-20: qty 1

## 2013-12-20 MED ORDER — PATIENT'S GUIDE TO USING COUMADIN BOOK
Freq: Once | Status: AC
  Administered 2013-12-20: 18:00:00
  Filled 2013-12-20: qty 1

## 2013-12-20 MED ORDER — WARFARIN SODIUM 7.5 MG PO TABS
7.5000 mg | ORAL_TABLET | Freq: Once | ORAL | Status: AC
  Administered 2013-12-20: 7.5 mg via ORAL
  Filled 2013-12-20 (×2): qty 1

## 2013-12-20 MED ORDER — WARFARIN VIDEO
Freq: Once | Status: AC
  Administered 2013-12-21: 10:00:00

## 2013-12-20 NOTE — Progress Notes (Addendum)
      [address]       [place], [address]             [phone]      8 Days Post-Op Procedure(s) (LRB): AORTIC VALVE REPLACEMENT (AVR) (N/A) MITRAL VALVE (MV) REPLACEMENT OR REPAIR (N/A) INTRAOPERATIVE TRANSESOPHAGEAL ECHOCARDIOGRAM (N/A)  Subjective:  [name] has no new complaints.  She continues to have issues with loose stools, but this is a chronic problem for her.  She is ambulating with assistance.  Objective: Vital signs in last 24 hours: Temp:  [97.6 F (36.4 C)-98 F (36.7 C)] 97.6 F (36.4 C) (03/13 0500) Pulse Rate:  [113-126] 114 (03/13 0500) Cardiac Rhythm:  [-] Normal sinus rhythm;Sinus tachycardia (03/12 2045) Resp:  [18-22] 18 (03/13 0500) BP: (114-127)/(55-63) 114/63 mmHg (03/13 0500) SpO2:  [96 %-98 %] 98 % (03/13 0500) Weight:  [226 lb 10.1 oz (102.8 kg)] 226 lb 10.1 oz (102.8 kg) (03/13 0500)  Intake/Output from previous day: 03/12 0701 - 03/13 0700 In: 720 [P.O.:720] Out: 900 [Urine:900]  General appearance: alert, cooperative and no distress Heart: regular rate and rhythm Lungs: clear to auscultation bilaterally Abdomen: soft, non-tender; bowel sounds normal; no masses,  no organomegaly Extremities: edema +1 Wound: clean and dry  Lab Results:  Recent Labs  12/18/13 0316  WBC 5.8  HGB 8.8*  HCT 27.3*  PLT 70*   BMET:  Recent Labs  12/18/13 0316 12/20/13 0530  NA 138 139  K 3.7 4.2  CL 97 96  CO2 31 33*  GLUCOSE 93 103*  BUN 21 18  CREATININE 1.03 0.97  CALCIUM 8.6 9.2    PT/INR:  Recent Labs  12/20/13 0530  LABPROT 16.4*  INR 1.36   ABG    Component Value Date/Time   PHART 7.274* 12/12/2013 2354   HCO3 24.8* 12/12/2013 2354   TCO2 25 12/13/2013 1641   ACIDBASEDEF 2.0 12/12/2013 2354   O2SAT 96.0 12/12/2013 2354   CBG (last 3)  No results found for this basename: GLUCAP,  in the last 72 hours  Assessment/Plan: S/P Procedure(s) (LRB): AORTIC VALVE REPLACEMENT (AVR) (N/A) MITRAL VALVE (MV)  REPLACEMENT OR REPAIR (N/A) INTRAOPERATIVE TRANSESOPHAGEAL ECHOCARDIOGRAM (N/A)  1. CV- Remains Sinus Tach- some rate improved, pressure remains 110s- on Lopressor at 25 mg BID, has only had 1 dose of Lopressor, will continue to follow and increase dosage if necessary 2. Pulm- wean oxygen as tolerated, instructed good use of IS 3. Renal- creatinine remains stable, remains volume overloaded, weight up 13lbs, will give IV Lasix today, no much result with PO yesterday 4. INR 1.36- minimal response after admission of 7.5 mg Wednesday, will repeat dose today 5. Dispo- need to get better control of HR, will titrate Lopressor as tolerated, continue diuresis, CXR ordered but not yet completed   LOS: 8 days    Ellwood Handler 12/20/2013   Chart reviewed, patient examined, agree with above. Rhythm appears sinus tachy 110. ECG last night showed bifascicular block with sinus tach. Continue lopressor and watch rhythm. Will keep pacer attached but off.  She still needs diuresis. Will give a dose of metolazone with the lasix this am. CXR looks good. There is small right effusion and mild right base atelectasis. This should resolve with diuresis and ambulation, IS.

## 2013-12-20 NOTE — Progress Notes (Signed)
CARDIAC REHAB PHASE I   PRE:  Rate/Rhythm: 116 ST  BP:  Supine:   Sitting: 80/49, 91/62  Standing:    SaO2: 93%RA  MODE:  Ambulation: 500 ft   POST:  Rate/Rhythm: 126-147 ST, 121 with rest  BP:  Supine:   Sitting: 103/68  Standing:    SaO2: 97%RA hall, 94%RA room 1005-1038 Assisted to [facility] before walk. Pt walked 500 ft on RA with rollator and asst x 1. Sat once in hall for brief rest. Pt did well on RA. No c/o dizziness with low BP. Some SOB noted but heart rate to 140s during walk. Pt using pursed lip breathing to relax and help with breathing.  Back to recliner after walk. Encouraged IS and more walks with staff.   Graylon Good, RN BSN  12/20/2013 10:33 AM

## 2013-12-20 NOTE — Discharge Summary (Signed)
Physician Discharge Summary  Patient ID: [name] MRN: [id number] DOB/AGE: 10/21/1945 68 y.o.  Admit date: 12/12/2013 Discharge date: 12/20/2013  Admission Diagnoses:  Patient Active Problem List   Diagnosis Date Noted  . Nausea vomiting and diarrhea 11/26/2013  . Near syncope 11/26/2013  . Pulmonary nodule 02/23/2012  . History of radiation therapy   . Lung nodule   . Unspecified deficiency anemia 09/21/2011  . 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 Rectal cancer, midrectum ypT3ypN0, s/p neoadj chemoXRT, lap LAR with diverting loop ileostomy 08/2011, ileostomy takedown 03/2012 05/26/2011  . Iron deficiency anemia 03/28/2011  . Unspecified essential hypertension 03/24/2009  . CONTACT DERMATITIS 02/25/2009  . PRESSURE ULCER UNSPECIFIED SITE 04/24/2008  . CARDIAC MURMUR, AORTIC 04/21/2008  . Morbid obesity 03/05/2008  . BACK PAIN 03/05/2008  . MERALGIA PARESTHETICA 12/13/2007  . ALLERGIC RHINITIS 08/08/2007  . HYPERLIPIDEMIA 05/03/2007  . ASTHMA 05/03/2007  . COPD 05/03/2007   Discharge Diagnoses:   Patient Active Problem List   Diagnosis Date Noted  . S/P AVR 12/20/2013  . Complete heart block 12/18/2013  . S/P MVR (mitral valve replacement) 12/12/2013  . Nausea vomiting and diarrhea 11/26/2013  . Near syncope 11/26/2013  . Pulmonary nodule 02/23/2012  . History of radiation therapy   . Lung nodule   . Unspecified deficiency anemia 09/21/2011  . 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 Rectal cancer, midrectum ypT3ypN0, s/p neoadj chemoXRT, lap LAR with diverting loop ileostomy 08/2011, ileostomy takedown 03/2012 05/26/2011  . Iron deficiency anemia 03/28/2011  . Unspecified essential hypertension 03/24/2009  . CONTACT DERMATITIS 02/25/2009  . PRESSURE ULCER UNSPECIFIED SITE 04/24/2008  . CARDIAC MURMUR, AORTIC 04/21/2008  . Morbid obesity 03/05/2008  . BACK PAIN 03/05/2008  . MERALGIA PARESTHETICA 12/13/2007  . ALLERGIC RHINITIS 08/08/2007  . HYPERLIPIDEMIA 05/03/2007  . ASTHMA  05/03/2007  . COPD 05/03/2007   Discharged Condition: good  History of Present Illness:   [name] is a 68 yo obese white female.  She is well known to [facility] from initial consultation in June 2014 for symptomatic severe AS and moderate MS.  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  The patient's medical history was also notable for rectal cancer S/P colectomy with ileostomy, chemotherapy, and radiation in 2012.  She underwent take down of Ileostomy in June of 2013.  Dr. Rollene Fare routinely follows this patient for her valvular heart disease and recommended after routine follow up in May 2014 that she be evaluated for possible surgical intervention.  She was evaluated by Dr. Cyndia Bent at which time she was symptomatic with exertional shortness of breath.  After review of her Echocardiogram it was recommended patient undergo surgical replacement.  The risks and benefits of the procedure were explained to the patient and ultimately she decided not to have surgery at that time.      In February 2015 the patient was admitted to the hospital with complaints of nausea and diarrhea.  She also had developed worsening dyspnea, dizziness, and a syncopal episode.  She was ruled out for C. Diff and her symptoms resolved quickly.  During this hospitalization she underwent catheterization which had been postponed from January due to pneumonia.  This did not reveal significant evidence of coronary disease, but again demonstrated severe AS and moderate mitral stenosis.  She was initially discharged home, however the evening of discharge the patient began to feel ill, with sudden onset worsening shortness of breath.  Her rescue inhalers did not provide any  relief.  Her saturations decreased overnight requiring re admission to the hospital for diuretic therapy.  [facility] was again consulted, Dr. Cyndia Bent evaluated the patient and again recommended proceeding with AVR and  MVR.  The risks and benefits were again explained to the patient and she was agreeable to proceed.     Hospital Course:   The patient presented to [facility] on [facility]/12/2013.  She was taken to the operating room and underwent AVR with a 21 mm [employer] Ease Pericardial Tissue Valve and a MVR with a 25 mm [employer] Mitral Ease Pericardial Tissue Valve.  She tolerated the procedure well and was taken to the SICU in stable condition.  The patient was extubated the evening of surgery.  During her stay in the SICU she was weaned off all drips as tolerated.  She required AV pacing, with underlying rhythm of Complete heart block.  The patients chest tube and arterial lines were removed without difficulty.  She was diuresed with IV Lasix for hypervolemia.  The patient's rhythm continued to be complete heart block.  EP consult was obtained and they felt the patient had high grade heart block.  They did not feel pacemaker was immediately required and wished to monitor patient for 24-48 hours.  Her intrinsic conduction resumed over the next 48 hours.  She was maintaining NSR with rate in the 90s-110's.  She was ambulating independently and transferred to the step down unit for stable condition.    The patient continues to have progress.  She continues to maintain NSR and back up pacing has been continued.  Her pacing wires were removed without difficulty.  She was started on Beta Blocker therapy and has been tolerating without difficulty.  She remains volume overloaded and was again diuresed with Lasix and Zaroxolyn.  She is on Coumadin and has been slow to respond.  Her INR is at 2.24 and she will be discharged home on 5 mg of coumadin. She continues to ambulate independently.  She is tolerating a cardiac diet.  Should no further issues arise we anticipate discharge home in the next 24-48 hours.        Consults: cardiology  Significant Diagnostic Studies: radiology:  ECHO:  - Left ventricle: Systolic function was normal. The estimated ejection fraction was in the range of 60% to 65%. - Aortic valve: planimetered AVA measured from 0.5 to 0.7cm2 consistent with severe AS Severe diffuse thickening and calcification. There was severe stenosis. Mild regurgitation directed eccentrically in the LVOT. - Mitral valve: There posterior MV leaflet and annulus are heavily calcified and are fixed. The anterior mitral valve leaflet is mildly thickened. The entire MV annulus is heavily calcified. The mean gradient ranged from 9-65mmHg. The findings are consistent with severe stenosis. Mild regurgitation. - Left atrium: The atrium was severely dilated. No evidence of thrombus in the atrial cavity or appendage. No evidence of thrombus in the appendage. There was spontaneous echo contrast ("smoke"). - Right atrium: The atrium was moderately dilated. - Atrial septum: No defect or patent foramen ovale was identified. - Tricuspid valve: No evidence of vegetation. - Pulmonic valve: No evidence of vegetation. - Superior vena cava: The study excluded a thrombus.  Treatments: surgery:   1. Median Sternotomy 2. Extracorporeal circulation 3. Aortic valve replacement using a 21 mm [employer] Magn[employer]-Ease Pericardial valve.  4. Mitral valve replacement using a 25 mm [employer] Magna-Ease Pericardial valve  Disposition: 01-Home or Self Care  Discharge Medication:   The  patient has been discharged on:   1.Beta Blocker:  Yes [  x ]                              No   [   ]                              If No, reason:  2.Ace Inhibitor/ARB: Yes [   ]                                     No  [x    ]                                     If No, reason: No CAD, labile blood pressure  3.Statin:   Yes [   ]                  No  [ x  ]                  If No, reason: NO CAD  4.Shela Commons:  Yes  [ x  ]                  No   [   ]                  If No, reason:    Medication List     STOP taking these medications       diltiazem 120 MG 24 hr capsule  Commonly known as:  CARDIZEM CD     levofloxacin 500 MG tablet  Commonly known as:  LEVAQUIN     methylPREDNIsolone 4 MG tablet  Commonly known as:  MEDROL DOSPACK      TAKE these medications       albuterol 108 (90 BASE) MCG/ACT inhaler  Commonly known as:  PROVENTIL HFA;VENTOLIN HFA  Inhale 2 puffs into the lungs every 6 (six) hours as needed for wheezing.     aspirin 81 MG tablet  Take 81 mg by mouth at bedtime.     CALCIUM 600 + D PO  Take 1,200 mg by mouth at bedtime.     Fluticasone Furoate-Vilanterol 100-25 MCG/INH Aepb  Commonly known as:  BREO ELLIPTA  Inhale 1 puff into the lungs daily.     furosemide 40 MG tablet  Commonly known as:  LASIX  Take 1 tablet (40 mg total) by mouth daily.     lovastatin 40 MG tablet  Commonly known as:  MEVACOR  Take 40 mg by mouth at bedtime.     Miconazole Nitrate 2 % Powd  1 application by Does not apply route daily. Apply to stomach and under breasts     montelukast 10 MG tablet  Commonly known as:  SINGULAIR  Take 10 mg by mouth at bedtime.     pantoprazole 40 MG tablet  Commonly known as:  PROTONIX  Take 40 mg by mouth at bedtime.     polyethylene glycol packet  Commonly known as:  MIRALAX / GLYCOLAX  Take 17 g by mouth daily.     potassium chloride SA 20 MEQ tablet  Commonly known as:  K-DUR,KLOR-CON  Take  1 tablet (20 mEq total) by mouth daily.     tiotropium 18 MCG inhalation capsule  Commonly known as:  SPIRIVA  Place 18 mcg into inhaler and inhale daily.     traMADol 50 MG tablet  Commonly known as:  ULTRAM  Take 1-2 tablets (50-100 mg total) by mouth every 4 (four) hours as needed for moderate pain.     warfarin 5 MG tablet  Commonly known as:  COUMADIN  Take 1 tablet (5 mg total) by mouth daily at 6 PM.             Future Appointments Provider Department Dept Phone   01/10/2014 8:00 AM [facility] [phone]   01/10/2014 8:30 AM Ladell Pier, MD [facility] [phone]          Follow-up Information   Follow up with Gaye Pollack, MD.   Specialty:  Cardiothoracic Surgery   Contact information:   [address] [place] [address] [phone]       Follow up with [facility].   Contact information:          Signed: Normagene Harvie 12/20/2013, 12:02 PM

## 2013-12-20 NOTE — Progress Notes (Signed)
Patient ambulated in hallway x1 assist 250 feet. Pt stated light headed this time. Back in room, will monitor patient. Jordyn Hofacker, Bettina Gavia RN

## 2013-12-20 NOTE — Discharge Instructions (Addendum)
Information on my medicine - Coumadin   (Warfarin) Why was Coumadin prescribed for you? Coumadin was prescribed for you because you have a blood clot or a medical condition that can cause an increased risk of forming blood clots. Blood clots can cause serious health problems by blocking the flow of blood to the heart, lung, or brain. Coumadin can prevent harmful blood clots from forming. As a reminder your indication for Coumadin is:   Blood Clot Prevention After Heart Valve Surgery  What test will check on my response to Coumadin? While on Coumadin (warfarin) you will need to have an INR test regularly to ensure that your dose is keeping you in the desired range. The INR (international normalized ratio) number is calculated from the result of the laboratory test called prothrombin time (PT).  If an INR APPOINTMENT HAS NOT ALREADY BEEN MADE FOR YOU please schedule an appointment to have this lab work done by your health care provider within 7 days. Your INR goal is usually a number between:  2 to 3 or your provider may give you a more narrow range like 2-2.5.  Ask your health care provider during an office visit what your goal INR is.  What  do you need to  know  About  COUMADIN? Take Coumadin (warfarin) exactly as prescribed by your healthcare provider about the same time each day.  DO NOT stop taking without talking to the doctor who prescribed the medication.  Stopping without other blood clot prevention medication to take the place of Coumadin may increase your risk of developing a new clot or stroke.  Get refills before you run out.  What do you do if you miss a dose? If you miss a dose, take it as soon as you remember on the same day then continue your regularly scheduled regimen the next day.  Do not take two doses of Coumadin at the same time.  Important Safety Information A possible side effect of Coumadin (Warfarin) is an increased risk of bleeding. You should call your healthcare  provider right away if you experience any of the following:   Bleeding from an injury or your nose that does not stop.   Unusual colored urine (red or dark brown) or unusual colored stools (red or black).   Unusual bruising for unknown reasons.   A serious fall or if you hit your head (even if there is no bleeding).  Some foods or medicines interact with Coumadin (warfarin) and might alter your response to warfarin. To help avoid this:   Eat a balanced diet, maintaining a consistent amount of Vitamin K.   Notify your provider about major diet changes you plan to make.   Avoid alcohol or limit your intake to 1 drink for women and 2 drinks for men per day. (1 drink is 5 oz. wine, 12 oz. beer, or 1.5 oz. liquor.)  Make sure that ANY health care provider who prescribes medication for you knows that you are taking Coumadin (warfarin).  Also make sure the healthcare provider who is monitoring your Coumadin knows when you have started a new medication including herbals and non-prescription products.  Coumadin (Warfarin)  Major Drug Interactions  Increased Warfarin Effect Decreased Warfarin Effect  Alcohol (large quantities) Antibiotics (esp. Septra/Bactrim, Flagyl, Cipro) Amiodarone (Cordarone) Aspirin (ASA) Cimetidine (Tagamet) Megestrol (Megace) NSAIDs (ibuprofen, naproxen, etc.) Piroxicam (Feldene) Propafenone (Rythmol SR) Propranolol (Inderal) Isoniazid (INH) Posaconazole (Noxafil) Barbiturates (Phenobarbital) Carbamazepine (Tegretol) Chlordiazepoxide (Librium) Cholestyramine (Questran) Griseofulvin Oral Contraceptives Rifampin Sucralfate (Carafate) Vitamin  K   Coumadin (Warfarin) Major Herbal Interactions  Increased Warfarin Effect Decreased Warfarin Effect  Garlic Ginseng Ginkgo biloba Coenzyme Q10 Green tea St. Johns wort    Coumadin (Warfarin) FOOD Interactions  Eat a consistent number of servings per week of foods HIGH in Vitamin K (1 serving =  cup)  Collards  (cooked, or boiled & drained) Kale (cooked, or boiled & drained) Mustard greens (cooked, or boiled & drained) Parsley *serving size only =  cup Spinach (cooked, or boiled & drained) Swiss chard (cooked, or boiled & drained) Turnip greens (cooked, or boiled & drained)  Eat a consistent number of servings per week of foods MEDIUM-HIGH in Vitamin K (1 serving = 1 cup)  Asparagus (cooked, or boiled & drained) Broccoli (cooked, boiled & drained, or raw & chopped) Brussel sprouts (cooked, or boiled & drained) *serving size only =  cup Lettuce, raw (green leaf, endive, romaine) Spinach, raw Turnip greens, raw & chopped   These websites have more information on Coumadin (warfarin):  FailFactory.se; VeganReport.com.au;  Mitral Valve Replacement, Care After  Refer to this sheet in the next few weeks. These instructions provide you with information on caring for yourself after your procedure. Your health care provider may also give you specific instructions. Your treatment has been planned according to current medical practices, but problems sometimes occur. Call your health care provider if you have any problems or questions after your procedure.  HOME CARE INSTRUCTIONS   Only take over-the-counter or prescription medicines as directed by your health care provider.  Take your temperature every morning for the first 7 days after surgery. Write these down.  Weigh yourself every morning for at least 7 days after surgery. Write your weight down.  Wear elastic stockings during the day for at least 2 weeks after surgery. Use them longer if your ankles are swollen. The stockings help blood flow and help reduce swelling in the legs.  Take frequent naps or rest often throughout the day.  Avoid lifting more than 10 lb (4.5 kg) or pushing or pulling things with your arms for 6 8 weeks or as directed by your health care provider.  Avoid driving or airplane travel for 4 6 weeks after  surgery or as directed. If you are riding in a car for an extended period, stop every 1 2 hours to stretch your legs.  Avoid crossing your legs.  Avoid climbing stairs and using the handrail to pull yourself up for the first 2 3 weeks after surgery.  Do not take baths for 2 4 weeks after surgery. Take showers once your health care provider approves. Pat incisions dry. Do not rub incisions with a washcloth or towel.  Return to work as directed by your health care provider.  Drink enough fluids to keep your urine clear or pale yellow.  Do not strain to have a bowel movement. Eat high-fiber foods if you become constipated. You may also take a medicine to help you have a bowel movement (laxative) as directed by your health care provider.  Resume sexual activity as directed by your health care provider. SEEK MEDICAL CARE IF:   You develop a skin rash.   Your weight is increasing each day over 2 3 days.  Your weight increases by 2 or more pounds (1 kg) in a single day. SEEK IMMEDIATE MEDICAL CARE IF:   You develop chest pain that is not coming from your incision.  You develop shortness of breath or difficulty breathing.  You have  a fever.  You have increased drainage from your wound.  You see redness, swelling, or have increasing pain in the wound.  You have pus coming from your wound.  You develop lightheadedness. MAKE SURE YOU:  Understand these directions.  Will watch your condition.  Will get help right away if you are not doing well or get worse. Document Released: 04/15/2005 Document Revised: 05/29/2013 Document Reviewed: 02/26/2013 [facility] Patient Information 2014 [place].

## 2013-12-21 LAB — BASIC METABOLIC PANEL
BUN: 20 mg/dL (ref 6–23)
CO2: 32 mEq/L (ref 19–32)
Calcium: 9.3 mg/dL (ref 8.4–10.5)
Chloride: 94 mEq/L — ABNORMAL LOW (ref 96–112)
Creatinine, Ser: 1.1 mg/dL (ref 0.50–1.10)
GFR calc Af Amer: 58 mL/min — ABNORMAL LOW (ref >90)
GFR calc non Af Amer: 50 mL/min — ABNORMAL LOW (ref >90)
Glucose, Bld: 90 mg/dL (ref 70–99)
Potassium: 4 mEq/L (ref 3.7–5.3)
Sodium: 139 mEq/L (ref 137–147)

## 2013-12-21 LAB — PROTIME-INR
INR: 1.62 — ABNORMAL HIGH (ref 0.00–1.49)
Prothrombin Time: 18.8 seconds — ABNORMAL HIGH (ref 11.6–15.2)

## 2013-12-21 MED ORDER — FUROSEMIDE 40 MG PO TABS
40.0000 mg | ORAL_TABLET | Freq: Every day | ORAL | Status: DC
  Administered 2013-12-21 – 2013-12-24 (×4): 40 mg via ORAL
  Filled 2013-12-21 (×4): qty 1

## 2013-12-21 MED ORDER — WARFARIN SODIUM 5 MG PO TABS
5.0000 mg | ORAL_TABLET | Freq: Once | ORAL | Status: AC
  Administered 2013-12-21: 5 mg via ORAL
  Filled 2013-12-21 (×2): qty 1

## 2013-12-21 MED ORDER — POTASSIUM CHLORIDE CRYS ER 20 MEQ PO TBCR
20.0000 meq | EXTENDED_RELEASE_TABLET | Freq: Every day | ORAL | Status: DC
  Administered 2013-12-21 – 2013-12-24 (×4): 20 meq via ORAL
  Filled 2013-12-21 (×3): qty 1

## 2013-12-21 MED ORDER — METOPROLOL TARTRATE 25 MG PO TABS
37.5000 mg | ORAL_TABLET | Freq: Two times a day (BID) | ORAL | Status: DC
  Administered 2013-12-21 – 2013-12-22 (×2): 37.5 mg via ORAL
  Filled 2013-12-21 (×4): qty 1

## 2013-12-21 NOTE — Progress Notes (Addendum)
[address]       [place] [address]             [phone]      9 Days Post-Op  Procedure(s) (LRB): AORTIC VALVE REPLACEMENT (AVR) (N/A) MITRAL VALVE (MV) REPLACEMENT OR REPAIR (N/A) INTRAOPERATIVE TRANSESOPHAGEAL ECHOCARDIOGRAM (N/A) Subjective: Feeling stronger, " bottom is sore" but loose stools have resolved  Objective  Telemetry sinus tachy  Temp:  [98.2 F (36.8 C)-98.3 F (36.8 C)] 98.2 F (36.8 C) (03/14 0432) Pulse Rate:  [106-121] 106 (03/14 0432) Resp:  [19-20] 19 (03/14 0432) BP: (91-123)/(49-79) 122/79 mmHg (03/14 0432) SpO2:  [93 %-94 %] 93 % (03/14 0742) Weight:  [225 lb 3.2 oz (102.15 kg)] 225 lb 3.2 oz (102.15 kg) (03/14 0432)   Intake/Output Summary (Last 24 hours) at 12/21/13 0824 Last data filed at 12/21/13 0600  Gross per 24 hour  Intake    480 ml  Output   2100 ml  Net  -1620 ml       General appearance: alert, cooperative and no distress Heart: regular rate and rhythm Lungs: dim in bases Abdomen: benign Extremities: min edema Wound: incis healing well  Lab Results:  Recent Labs  12/20/13 0530 12/21/13 0513  NA 139 139  K 4.2 4.0  CL 96 94*  CO2 33* 32  GLUCOSE 103* 90  BUN 18 20  CREATININE 0.97 1.10  CALCIUM 9.2 9.3   No results found for this basename: AST, ALT, ALKPHOS, BILITOT, PROT, ALBUMIN,  in the last 72 hours No results found for this basename: LIPASE, AMYLASE,  in the last 72 hours No results found for this basename: WBC, NEUTROABS, HGB, HCT, MCV, PLT,  in the last 72 hours No results found for this basename: CKTOTAL, CKMB, TROPONINI,  in the last 72 hours No components found with this basename: POCBNP,  No results found for this basename: DDIMER,  in the last 72 hours No results found for this basename: HGBA1C,  in the last 72 hours No results found for this basename: CHOL, HDL, LDLCALC, TRIG, CHOLHDL,  in the last 72 hours No results found for this basename: TSH, T4TOTAL, FREET3,  T3FREE, THYROIDAB,  in the last 72 hours No results found for this basename: VITAMINB12, FOLATE, FERRITIN, TIBC, IRON, RETICCTPCT,  in the last 72 hours  Medications: Scheduled . aspirin  81 mg Oral Daily  . enoxaparin (LOVENOX) injection  40 mg Subcutaneous QHS  . metoprolol tartrate  25 mg Oral BID  . mometasone-formoterol  2 puff Inhalation BID  . montelukast  10 mg Oral QHS  . pantoprazole  40 mg Oral QAC breakfast  . potassium chloride  20 mEq Oral BID  . simvastatin  20 mg Oral q1800  . sodium chloride  3 mL Intravenous Q12H  . tiotropium  18 mcg Inhalation Daily  . warfarin   Does not apply Once  . Warfarin - Physician Dosing Inpatient   Does not apply [id number]     Radiology/Studies:  Dg Chest 2 View  12/20/2013   CLINICAL DATA:  Status post MVR/AVR  EXAM: CHEST  2 VIEW  COMPARISON:  December 16, 2013  FINDINGS: The patient is status post prior median sternotomy. Aortic and mitral valvular replacement rings are identified. The heart size is enlarged. There are small bilateral pleural effusions. There is mild atelectasis of both lung bases. The visualized skeletal structures are stable.  IMPRESSION: Small bilateral pleural effusions. Mild atelectasis of both lung bases.Aortic and  mitral valvular replacement rings are identified.   Electronically Signed   By: Abelardo Diesel [name]   On: 12/20/2013 08:30    INR:1.62 Will add last result for INR, ABG once components are confirmed Will add last 4 CBG results once components are confirmed  Assessment/Plan: S/P Procedure(s) (LRB): AORTIC VALVE REPLACEMENT (AVR) (N/A) MITRAL VALVE (MV) REPLACEMENT OR REPAIR (N/A) INTRAOPERATIVE TRANSESOPHAGEAL ECHOCARDIOGRAM (N/A)  1 remains tachy, will increase lopressor dose 2 cont lasix- start po, creat ok, good uo 3 will give 5 mg coumadin today 4 push pulm toilet/rehab as able    LOS: 9 days    GOLD,WAYNE E 3/14/20158:24 AM  patient examined and medical record reviewed,agree with above  note. Patient increasing strength and walking intervals Heart rate remains high with ambulation and metoprolol dose slightly increased INR slowly approaching therapeutic level, DC Lovenox when INR greater than 1.8 VAN TRIGT III,PETER 12/21/2013

## 2013-12-21 NOTE — Progress Notes (Signed)
CARDIAC REHAB PHASE I   PRE:  Rate/Rhythm: 107 ST  BP:  Supine:   Sitting: 92/49  Standing:    SaO2: 96% RA  MODE:  Ambulation: 450 ft   POST:  Rate/Rhythm: 115  BP:  Supine:   Sitting: 117/52  Standing:    SaO2: 97% RA  1424-1459 Patient tolerated ambulation fair with assist x1 and pushing rollator. Gait steady, 2 standing rest breaks taken. SaO2 maintained at 96% RA during walk. To chair bathroom, then chair after walk with legs elevated.  Seward Carol, MS, ACSM CES

## 2013-12-21 NOTE — Progress Notes (Signed)
Pt sitting up in chair and upon getting up out of chair to go to BR pt noticed bright red blood on pad on chair; RN called to room; RN noticed excoriated breakdown on inner side of R buttocks; barrier cream applied to site; pad changed on chair; will cont. To monitor.

## 2013-12-22 LAB — PROTIME-INR
INR: 1.69 — ABNORMAL HIGH (ref 0.00–1.49)
Prothrombin Time: 19.4 seconds — ABNORMAL HIGH (ref 11.6–15.2)

## 2013-12-22 MED ORDER — METOPROLOL TARTRATE 50 MG PO TABS
50.0000 mg | ORAL_TABLET | Freq: Two times a day (BID) | ORAL | Status: DC
  Administered 2013-12-22 – 2013-12-23 (×4): 50 mg via ORAL
  Filled 2013-12-22 (×6): qty 1

## 2013-12-22 MED ORDER — WARFARIN SODIUM 5 MG PO TABS
5.0000 mg | ORAL_TABLET | Freq: Once | ORAL | Status: AC
  Administered 2013-12-22: 5 mg via ORAL
  Filled 2013-12-22: qty 1

## 2013-12-22 NOTE — Progress Notes (Signed)
Patient ambulated in hall 450 ft with RN using wheelchair. Had to take one rest brake. HR up to 115 while ambulating. Will continue to monitor.

## 2013-12-22 NOTE — Progress Notes (Signed)
Pt ambulated 350 feet with 1 rest break; pt needed to sit for a 2nd rest break and was too "tired and weak" to ambulate back to room; pt pushed in wheelchair back to room; pt to chair after walk; pt c/o feeling very tired today; will cont. To monitor.

## 2013-12-22 NOTE — Progress Notes (Signed)
VS after ambulation BP 127/82 HR 120; will recheck vitals in 1 hour and reassess if ok to give Metoprolol.

## 2013-12-22 NOTE — Progress Notes (Signed)
Patients HR has been sustained ST in 120's since around 2230. BP now high  enough to give her  2200 dose Metoprolol. Will continue to monitor.

## 2013-12-22 NOTE — Progress Notes (Signed)
Pt ambulated 400 feet with rolling walker; HR up to 124 while ambulating; spoke with PA regarding HR; RN to give metoprolol dose at this time; pt back to room to chair after ambulation; call bell w/i reach; husband in room; BP 113/74; HR 120; will cont. To monitor.

## 2013-12-22 NOTE — Progress Notes (Signed)
[address]       [place] [address]             [phone]      10 Days Post-Op  Procedure(s) (LRB): AORTIC VALVE REPLACEMENT (AVR) (N/A) MITRAL VALVE (MV) REPLACEMENT OR REPAIR (N/A) INTRAOPERATIVE TRANSESOPHAGEAL ECHOCARDIOGRAM (N/A) Subjective: Had a lot of nausea overnight- feeling ok currently  Objective  Telemetry sinus tachy  Temp:  [98 F (36.7 C)-98.2 F (36.8 C)] 98 F (36.7 C) (03/15 0435) Pulse Rate:  [116-118] 116 (03/15 0435) Resp:  [15-17] 17 (03/15 0435) BP: (94-116)/(36-66) 116/66 mmHg (03/15 0435) SpO2:  [93 %-95 %] 94 % (03/15 0435) Weight:  [223 lb 9.6 oz (101.424 kg)] 223 lb 9.6 oz (101.424 kg) (03/15 0435)   Intake/Output Summary (Last 24 hours) at 12/22/13 0751 Last data filed at 12/22/13 0436  Gross per 24 hour  Intake    480 ml  Output   3000 ml  Net  -2520 ml       General appearance: alert, cooperative and no distress Heart: regular rate and rhythm and tachy without murmur Lungs: mildly dim in bases Abdomen: benign Extremities: minimal LE edema Wound: incisions healing well  Lab Results:  Recent Labs  12/20/13 0530 12/21/13 0513  NA 139 139  K 4.2 4.0  CL 96 94*  CO2 33* 32  GLUCOSE 103* 90  BUN 18 20  CREATININE 0.97 1.10  CALCIUM 9.2 9.3   No results found for this basename: AST, ALT, ALKPHOS, BILITOT, PROT, ALBUMIN,  in the last 72 hours No results found for this basename: LIPASE, AMYLASE,  in the last 72 hours No results found for this basename: WBC, NEUTROABS, HGB, HCT, MCV, PLT,  in the last 72 hours No results found for this basename: CKTOTAL, CKMB, TROPONINI,  in the last 72 hours No components found with this basename: POCBNP,  No results found for this basename: DDIMER,  in the last 72 hours No results found for this basename: HGBA1C,  in the last 72 hours No results found for this basename: CHOL, HDL, LDLCALC, TRIG, CHOLHDL,  in the last 72 hours No results found for this basename:  TSH, T4TOTAL, FREET3, T3FREE, THYROIDAB,  in the last 72 hours No results found for this basename: VITAMINB12, FOLATE, FERRITIN, TIBC, IRON, RETICCTPCT,  in the last 72 hours  Medications: Scheduled . aspirin  81 mg Oral Daily  . enoxaparin (LOVENOX) injection  40 mg Subcutaneous QHS  . furosemide  40 mg Oral Daily  . metoprolol tartrate  37.5 mg Oral BID  . mometasone-formoterol  2 puff Inhalation BID  . montelukast  10 mg Oral QHS  . pantoprazole  40 mg Oral QAC breakfast  . potassium chloride  20 mEq Oral Daily  . simvastatin  20 mg Oral q1800  . sodium chloride  3 mL Intravenous Q12H  . tiotropium  18 mcg Inhalation Daily  . Warfarin - Physician Dosing Inpatient   Does not apply [id number]     Radiology/Studies:  Dg Chest 2 View  12/20/2013   CLINICAL DATA:  Status post MVR/AVR  EXAM: CHEST  2 VIEW  COMPARISON:  December 16, 2013  FINDINGS: The patient is status post prior median sternotomy. Aortic and mitral valvular replacement rings are identified. The heart size is enlarged. There are small bilateral pleural effusions. There is mild atelectasis of both lung bases. The visualized skeletal structures are stable.  IMPRESSION: Small bilateral pleural effusions. Mild atelectasis of both  lung bases.Aortic and mitral valvular replacement rings are identified.   Electronically Signed   By: Abelardo Diesel [name]   On: 12/20/2013 08:30    INR:1.69 Will add last result for INR, ABG once components are confirmed Will add last 4 CBG results once components are confirmed  Assessment/Plan: S/P Procedure(s) (LRB): AORTIC VALVE REPLACEMENT (AVR) (N/A) MITRAL VALVE (MV) REPLACEMENT OR REPAIR (N/A) INTRAOPERATIVE TRANSESOPHAGEAL ECHOCARDIOGRAM (N/A)  1 steady progress 2 will increase beta blocker dose 3 cont coumadin load- steady rise, cont lovenox till INR 1.8 4 sugars ok   LOS: 10 days    [name] E 3/15/20157:51 AM

## 2013-12-22 NOTE — Progress Notes (Signed)
Patient ambulated in hall 450 ft with RN and wheelchair. Patient took one rest break. HR 120's and O2 sat 95% while ambulating. Gait steady. Will cont to monitor.

## 2013-12-23 LAB — PROTIME-INR
INR: 1.94 — ABNORMAL HIGH (ref 0.00–1.49)
Prothrombin Time: 21.6 seconds — ABNORMAL HIGH (ref 11.6–15.2)

## 2013-12-23 MED ORDER — WARFARIN SODIUM 5 MG PO TABS
5.0000 mg | ORAL_TABLET | Freq: Once | ORAL | Status: AC
  Administered 2013-12-23: 5 mg via ORAL
  Filled 2013-12-23: qty 1

## 2013-12-23 NOTE — Progress Notes (Signed)
CARDIAC REHAB PHASE I   PRE:  Rate/Rhythm: 76 SR  BP:  Supine:   Sitting: 97/65  Standing:    SaO2: 95%RA  MODE:  Ambulation: 550 ft   POST:  Rate/Rhythm: 102 ST  BP:  Supine:   Sitting: 110/70  Standing:    SaO2: 94%RA [id number] Pt walked 550 ft on RA with rollator and minimal asst. Sat once to rest. Tolerated well. Pt would like rollator if insurance will cover as she tires easily and needs to sit to rest. To recliner after walk.   Graylon Good, RN BSN  12/23/2013 8:33 AM

## 2013-12-23 NOTE — Progress Notes (Signed)
      [address]       [place], [address]             [phone]      11 Days Post-Op Procedure(s) (LRB): AORTIC VALVE REPLACEMENT (AVR) (N/A) MITRAL VALVE (MV) REPLACEMENT OR REPAIR (N/A) INTRAOPERATIVE TRANSESOPHAGEAL ECHOCARDIOGRAM (N/A)  Subjective:  [name] has no complaints this morning.  She states that she is feeling really good.  She is no longer suffering from loose stools.  She is ambulating with a walker  Objective: Vital signs in last 24 hours: Temp:  [97.3 F (36.3 C)-98.4 F (36.9 C)] 97.8 F (36.6 C) (03/16 0429) Pulse Rate:  [72-120] 80 (03/16 0429) Cardiac Rhythm:  [-] Sinus tachycardia (03/15 1940) Resp:  [18-20] 19 (03/16 0429) BP: (90-127)/(55-82) 98/63 mmHg (03/16 0429) SpO2:  [90 %-95 %] 90 % (03/16 0429) Weight:  [222 lb 14.2 oz (101.1 kg)] 222 lb 14.2 oz (101.1 kg) (03/16 1157)  Intake/Output from previous day: 03/15 0701 - 03/16 0700 In: 780 [P.O.:780] Out: 1700 [Urine:1700]  General appearance: alert, cooperative and no distress Heart: regular rate and rhythm Lungs: clear to auscultation bilaterally Abdomen: soft, non-tender; bowel sounds normal; no masses,  no organomegaly Extremities: edema none appreciated Wound: clean and dry  Lab Results: No results found for this basename: WBC, HGB, HCT, PLT,  in the last 72 hours BMET:  Recent Labs  12/21/13 0513  NA 139  K 4.0  CL 94*  CO2 32  GLUCOSE 90  BUN 20  CREATININE 1.10  CALCIUM 9.3    PT/INR:  Recent Labs  12/23/13 0410  LABPROT 21.6*  INR 1.94*   ABG    Component Value Date/Time   PHART 7.274* 12/12/2013 2354   HCO3 24.8* 12/12/2013 2354   TCO2 25 12/13/2013 1641   ACIDBASEDEF 2.0 12/12/2013 2354   O2SAT 96.0 12/12/2013 2354   CBG (last 3)  No results found for this basename: GLUCAP,  in the last 72 hours  Assessment/Plan: S/P Procedure(s) (LRB): AORTIC VALVE REPLACEMENT (AVR) (N/A) MITRAL VALVE (MV) REPLACEMENT OR REPAIR (N/A) INTRAOPERATIVE  TRANSESOPHAGEAL ECHOCARDIOGRAM (N/A)  1. CV- Maintaining NSR, rate improved in 70's, pressure remains well controlled- continue Lopressor 2. Pulm- off oxygen, continue IS 3. Renal- remains mildly volume overloaded, no LE edema appreciated, weight is stable 4. INR 1.94, will continue 5 mg of coumadin tonight 5. Dispo- patient is stable, will d/c Lovenox, d/c EPW, possibly d/c in AM   LOS: 11 days    [name] 12/23/2013

## 2013-12-23 NOTE — Progress Notes (Signed)
Ambulated 500 ft in hallway using rolling walker. Patient stopped to sit x1 d/t tiredness before returning to bedside chair in room. Call bell and family near. Will monitor.[name]

## 2013-12-23 NOTE — Progress Notes (Signed)
Ambulated 500 ft in hallway using rolling walker. Patient stopped to sit once due to fatigue. Pt returned to recliner in room. Call bell within reach.

## 2013-12-23 NOTE — Progress Notes (Signed)
INR 1.94; MD aware. EPW removed per order and protocol with ends intact. No bleeding or ectopy noted. Patient instructed to lay in bed supine x1hr; verbalized understanding. Call bell near. Will continue to monitor.Cindee Salt

## 2013-12-24 LAB — PROTIME-INR
INR: 2.29 — ABNORMAL HIGH (ref 0.00–1.49)
Prothrombin Time: 24.5 seconds — ABNORMAL HIGH (ref 11.6–15.2)

## 2013-12-24 MED ORDER — WARFARIN SODIUM 5 MG PO TABS: 5.0000 mg | ORAL_TABLET | Freq: Every day | ORAL | Status: DC

## 2013-12-24 MED ORDER — TRAMADOL HCL 50 MG PO TABS: 50.0000 mg | ORAL_TABLET | ORAL | Status: DC | PRN

## 2013-12-24 NOTE — Progress Notes (Signed)
      [address]       [place] [address]             [phone]      12 Days Post-Op Procedure(s) (LRB): AORTIC VALVE REPLACEMENT (AVR) (N/A) MITRAL VALVE (MV) REPLACEMENT OR REPAIR (N/A) INTRAOPERATIVE TRANSESOPHAGEAL ECHOCARDIOGRAM (N/A)  Subjective:  Mrs. Rovira complains of some visual complaints this morning.  She states that she has had these spots over her vision that occur intermittently for a long time.  She states her Opthamologist follows them.  She has no other neuro complaints.  Objective: Vital signs in last 24 hours: Temp:  [97.4 F (36.3 C)-98 F (36.7 C)] 98 F (36.7 C) (03/17 0525) Pulse Rate:  [68-77] 68 (03/17 0525) Cardiac Rhythm:  [-] Heart block (03/16 2000) Resp:  [18] 18 (03/17 0525) BP: (110-121)/(54-71) 121/64 mmHg (03/17 0525) SpO2:  [90 %-95 %] 94 % (03/17 0525) Weight:  [222 lb 10.6 oz (101 kg)] 222 lb 10.6 oz (101 kg) (03/17 0525)  Intake/Output from previous day: 03/16 0701 - 03/17 0700 In: 720 [P.O.:720] Out: 2650 [Urine:2650]  General appearance: alert, cooperative and no distress Neurologic: intact Heart: regular rate and rhythm Lungs: clear to auscultation bilaterally Abdomen: soft, non-tender; bowel sounds normal; no masses,  no organomegaly Extremities: edema none appreciated Wound: clean and dry  Lab Results: No results found for this basename: WBC, HGB, HCT, PLT,  in the last 72 hours BMET: No results found for this basename: NA, K, CL, CO2, GLUCOSE, BUN, CREATININE, CALCIUM,  in the last 72 hours  PT/INR:  Recent Labs  12/24/13 0545  LABPROT 24.5*  INR 2.29*   ABG    Component Value Date/Time   PHART 7.274* 12/12/2013 2354   HCO3 24.8* 12/12/2013 2354   TCO2 25 12/13/2013 1641   ACIDBASEDEF 2.0 12/12/2013 2354   O2SAT 96.0 12/12/2013 2354   CBG (last 3)  No results found for this basename: GLUCAP,  in the last 72 hours  Assessment/Plan: S/P Procedure(s) (LRB): AORTIC VALVE REPLACEMENT (AVR)  (N/A) MITRAL VALVE (MV) REPLACEMENT OR REPAIR (N/A) INTRAOPERATIVE TRANSESOPHAGEAL ECHOCARDIOGRAM (N/A)  1. CV- maintaining NSR, good rate and pressure control- continue Lopressor 2. Pulm- no acute issue, encouraged use of IS at discharge 3. Renal- creatinine has been stable, remains volume overloaded will resume home Lasix dose 4. INR 2.24- will continue 5 mg of Coumadin daily 5. Dispo- patient stable, will d/c today   LOS: 12 days    Syrenity Klepacki 12/24/2013

## 2013-12-24 NOTE — Care Management Note (Addendum)
    Page 1 of 2   12/24/2013     3:09:21 PM   CARE MANAGEMENT NOTE 12/24/2013  Patient:  [name], [name]   Account Number:  [id number]  Date Initiated:  12/16/2013  Documentation initiated by:  MAYO,HENRIETTA  Subjective/Objective Assessment:   S/P aortic and mitral valve replacements; lives with spouse    PCP  Dr Adella Hare     Action/Plan:   Anticipated DC Date:  12/22/2013   Anticipated DC Plan:  [place]  CM consult      [facility] Choice  HOME HEALTH   Choice offered to / List presented to:  C-1 Patient   DME arranged  Vassie Moselle      DME agency  [place] arranged  HH-1 RN      [place].   Status of service:  Completed, signed off Medicare Important Message given?   (If response is "NO", the following Medicare IM given date fields will be blank) Date Medicare IM given:   Date Additional Medicare IM given:    Discharge Disposition:  [place]  Per UR Regulation:  Reviewed for med. necessity/level of care/duration of stay  If discussed at [place] of Stay Meetings, dates discussed:   12/24/2013    Comments:  12/24/13 Gaudencio Chesnut,RN,BSN [phone] PT [place]; WILL ARRANGE [place]; PT PREFERS [place] TO [facility].  START OF CARE 24-48H POST DC DATE.  [place], PER REQUEST.  12/18/13 1117 [place] RN MSN BSN CCM Pt initially in CHB, then in 2nd degree, now in NSR. Ambulating well on unit with staff, spouse present.

## 2013-12-24 NOTE — Progress Notes (Signed)
CARDIAC REHAB PHASE I   PRE:  Rate/Rhythm: 80 SR  BP:  Supine:   Sitting: 99/65  Standing:    SaO2: 93%RA  MODE:  Ambulation: 550 ft   POST:  Rate/Rhythm: 83  BP:  Supine:   Sitting: 111/71  Standing:    SaO2: 97%RA 0920-1013 Pt stated not for discharge until after three so I walked with pt. Pt walked 550 ft on RA with rollator and minimal asst with pt sitting once to rest. Tolerated well. To recliner after walk. Would recommend rollator for home use if insurance will cover as pt has been sitting to rest during walks. Has walker at home but does not have a seat. Education completed with pt. Understanding voiced. Discussed CRP 2 and pt gave permission to refer to [facility] program. To recliner with call bell. Encouraged pt to watch discharge video.    Graylon Good, RN BSN  12/24/2013 10:08 AM

## 2013-12-26 ENCOUNTER — Other Ambulatory Visit: Payer: Medicare Other

## 2013-12-26 ENCOUNTER — Ambulatory Visit: Payer: Medicare Other | Admitting: Oncology

## 2013-12-26 ENCOUNTER — Telehealth: Payer: Self-pay | Admitting: Cardiovascular Disease

## 2013-12-26 NOTE — Telephone Encounter (Signed)
Pt d/c post mitral valve replacement - will be on warfarin x 3 months.  Went home on 5mg  qd.  Asked RN to repeat INR Friday March 20.  RN voiced understanding

## 2013-12-26 NOTE — Telephone Encounter (Signed)
HH requesting VO for INR checks and will call results to [facility].  Message forwarded to Tommy Medal, PharmD.

## 2013-12-27 ENCOUNTER — Ambulatory Visit: Payer: Medicare Other | Admitting: Pharmacist Clinician (PhC)/ Clinical Pharmacy Specialist

## 2013-12-27 ENCOUNTER — Ambulatory Visit (INDEPENDENT_AMBULATORY_CARE_PROVIDER_SITE_OTHER): Payer: Medicare Other | Admitting: Pharmacist Clinician (PhC)/ Clinical Pharmacy Specialist

## 2013-12-27 ENCOUNTER — Telehealth: Payer: Self-pay | Admitting: Cardiovascular Disease

## 2013-12-27 DIAGNOSIS — Z952 Presence of prosthetic heart valve: Secondary | ICD-10-CM

## 2013-12-27 DIAGNOSIS — Z7901 Long term (current) use of anticoagulants: Secondary | ICD-10-CM

## 2013-12-27 DIAGNOSIS — Z954 Presence of other heart-valve replacement: Secondary | ICD-10-CM

## 2013-12-27 LAB — POCT INR: INR: 3.9

## 2013-12-27 NOTE — Telephone Encounter (Signed)
Per Mariann Laster, INR result sent to [facility] (PharmD).

## 2013-12-30 ENCOUNTER — Telehealth: Payer: Self-pay | Admitting: *Deleted

## 2013-12-30 NOTE — Telephone Encounter (Signed)
Signed order for Phase II Cardiac Rehab faxed 12/26/13.

## 2013-12-31 ENCOUNTER — Encounter: Payer: Self-pay | Admitting: Internal Medicine

## 2014-01-01 ENCOUNTER — Ambulatory Visit (INDEPENDENT_AMBULATORY_CARE_PROVIDER_SITE_OTHER): Payer: Medicare Other | Admitting: Pharmacist Clinician (PhC)/ Clinical Pharmacy Specialist

## 2014-01-01 DIAGNOSIS — Z952 Presence of prosthetic heart valve: Secondary | ICD-10-CM

## 2014-01-01 DIAGNOSIS — Z954 Presence of other heart-valve replacement: Secondary | ICD-10-CM

## 2014-01-01 LAB — POCT INR: INR: 3.4

## 2014-01-03 ENCOUNTER — Other Ambulatory Visit: Payer: Self-pay | Admitting: *Deleted

## 2014-01-03 DIAGNOSIS — I359 Nonrheumatic aortic valve disorder, unspecified: Secondary | ICD-10-CM

## 2014-01-06 DIAGNOSIS — I509 Heart failure, unspecified: Secondary | ICD-10-CM

## 2014-01-06 DIAGNOSIS — Z48812 Encounter for surgical aftercare following surgery on the circulatory system: Secondary | ICD-10-CM

## 2014-01-08 ENCOUNTER — Ambulatory Visit (INDEPENDENT_AMBULATORY_CARE_PROVIDER_SITE_OTHER): Payer: Medicare Other | Admitting: Cardiovascular Disease

## 2014-01-08 ENCOUNTER — Encounter: Payer: Self-pay | Admitting: Surgery

## 2014-01-08 ENCOUNTER — Ambulatory Visit
Admission: RE | Admit: 2014-01-08 | Discharge: 2014-01-08 | Disposition: A | Discharge status: Home / Self Care | Payer: Medicare Other | Source: Ambulatory Visit | Attending: Surgery | Admitting: Surgery

## 2014-01-08 ENCOUNTER — Telehealth: Payer: Self-pay | Admitting: *Deleted

## 2014-01-08 ENCOUNTER — Encounter: Payer: Self-pay | Admitting: Cardiovascular Disease

## 2014-01-08 ENCOUNTER — Ambulatory Visit (INDEPENDENT_AMBULATORY_CARE_PROVIDER_SITE_OTHER): Payer: Self-pay | Admitting: Surgery

## 2014-01-08 ENCOUNTER — Ambulatory Visit (INDEPENDENT_AMBULATORY_CARE_PROVIDER_SITE_OTHER): Payer: Medicare Other | Admitting: Pharmacist Clinician (PhC)/ Clinical Pharmacy Specialist

## 2014-01-08 VITALS — BP 130/70 | HR 96 | Resp 20 | Ht 68.0 in | Wt 212.0 lb

## 2014-01-08 VITALS — BP 125/75 | HR 102 | Resp 20 | Ht 68.0 in | Wt 212.0 lb

## 2014-01-08 DIAGNOSIS — D649 Anemia, unspecified: Secondary | ICD-10-CM

## 2014-01-08 DIAGNOSIS — Z952 Presence of prosthetic heart valve: Secondary | ICD-10-CM

## 2014-01-08 DIAGNOSIS — I359 Nonrheumatic aortic valve disorder, unspecified: Secondary | ICD-10-CM

## 2014-01-08 DIAGNOSIS — I059 Rheumatic mitral valve disease, unspecified: Secondary | ICD-10-CM

## 2014-01-08 DIAGNOSIS — Z954 Presence of other heart-valve replacement: Secondary | ICD-10-CM

## 2014-01-08 DIAGNOSIS — Z79899 Other long term (current) drug therapy: Secondary | ICD-10-CM

## 2014-01-08 DIAGNOSIS — Z7901 Long term (current) use of anticoagulants: Secondary | ICD-10-CM

## 2014-01-08 DIAGNOSIS — I350 Nonrheumatic aortic (valve) stenosis: Secondary | ICD-10-CM

## 2014-01-08 DIAGNOSIS — Z8679 Personal history of other diseases of the circulatory system: Secondary | ICD-10-CM

## 2014-01-08 LAB — CBC
HCT: 35.3 % — ABNORMAL LOW (ref 36.0–46.0)
Hemoglobin: 11.1 g/dL — ABNORMAL LOW (ref 12.0–15.0)
MCH: 28.4 pg (ref 26.0–34.0)
MCHC: 31.4 g/dL (ref 30.0–36.0)
MCV: 90.3 fL (ref 78.0–100.0)
Platelets: 276 10*3/uL (ref 150–400)
RBC: 3.91 MIL/uL (ref 3.87–5.11)
RDW: 16.8 % — ABNORMAL HIGH (ref 11.5–15.5)
WBC: 5.6 10*3/uL (ref 4.0–10.5)

## 2014-01-08 LAB — COMPREHENSIVE METABOLIC PANEL
ALT: <8 U/L (ref 0–35)
AST: 18 U/L (ref 0–37)
Albumin: 3.8 g/dL (ref 3.5–5.2)
Alkaline Phosphatase: 101 U/L (ref 39–117)
BUN: 17 mg/dL (ref 6–23)
CO2: 26 mEq/L (ref 19–32)
Calcium: 9.4 mg/dL (ref 8.4–10.5)
Chloride: 101 mEq/L (ref 96–112)
Creat: 1.09 mg/dL (ref 0.50–1.10)
Glucose, Bld: 102 mg/dL — ABNORMAL HIGH (ref 70–99)
Potassium: 4.2 mEq/L (ref 3.5–5.3)
Sodium: 138 mEq/L (ref 135–145)
Total Bilirubin: 0.6 mg/dL (ref 0.2–1.2)
Total Protein: 6.7 g/dL (ref 6.0–8.3)

## 2014-01-08 LAB — POCT INR: INR: 2.9

## 2014-01-08 MED ORDER — DILTIAZEM HCL ER COATED BEADS 120 MG PO CP24: 120.0000 mg | ORAL_CAPSULE | Freq: Every day | ORAL | Status: DC

## 2014-01-08 NOTE — Telephone Encounter (Signed)
Pt called and had some questions about her coumadin dosing. She asked that you call her back.

## 2014-01-08 NOTE — Telephone Encounter (Signed)
Returned call and clarified dose for patient

## 2014-01-08 NOTE — Progress Notes (Signed)
Patient ID: [name], female   DOB: [birth year], 68 y.o.   MRN: [id number]     Reason for office visit Followup after valvular heart surgery  [name] is now roughly one month following replacement of both her aortic valve and mitral valve for combined aortic stenosis and mitral stenosis. Surgery was performed on March 5 and she was discharged from the hospital on March 13. The aortic valve prosthesis is a 21 mm [employer] MagnaEase pericardial tissue valve and the mitral valve is a 25 mm device of the same make. She is slowly recovering but has dyspnea walking across the room. She has lost a relatively 22 pounds of fluid that she again postoperatively. 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 She has not had any bleeding problems. She did not have atri al fibrillation. Left ventricle systolic function is normal. The last hemoglobin before discharge was around 9 g/dL. Chest x-ray on March 27 shows only a small residual right pleural effusion. She has not yet started cardiac rehabilitation   Allergies  Allergen Reactions  . Codeine Nausea Only  . Prednisone Hives    In different places all over the body.  . Sulfonamide Derivatives Nausea Only    Current Outpatient Prescriptions  Medication Sig Dispense Refill  . albuterol (PROVENTIL HFA;VENTOLIN HFA) 108 (90 BASE) MCG/ACT inhaler Inhale 2 puffs into the lungs every 6 (six) hours as needed for wheezing.      Marland Kitchen aspirin 81 MG tablet Take 81 mg by mouth at bedtime.       . Calcium Carbonate-Vitamin D (CALCIUM 600 + D PO) Take 1,200 mg by mouth at bedtime.       . Fluticasone Furoate-Vilanterol (BREO ELLIPTA) 100-25 MCG/INH AEPB Inhale 1 puff into the lungs daily.  30 each  5  . furosemide (LASIX) 40 MG tablet Take 1 tablet (40 mg total) by mouth daily.  30 tablet  5  . lovastatin (MEVACOR) 40 MG  tablet Take 40 mg by mouth at bedtime.      . Miconazole Nitrate 2 % POWD 1 application by Does not apply route daily. Apply to stomach and under breasts      . montelukast (SINGULAIR) 10 MG tablet Take 10 mg by mouth at bedtime.      . pantoprazole (PROTONIX) 40 MG tablet Take 40 mg by mouth at bedtime.       . polyethylene glycol (MIRALAX / GLYCOLAX) packet Take 17 g by mouth daily.  14 each  0  . potassium chloride SA (K-DUR,KLOR-CON) 20 MEQ tablet Take 1 tablet (20 mEq total) by mouth daily.  30 tablet  5  . tiotropium (SPIRIVA) 18 MCG inhalation capsule Place 18 mcg into inhaler and inhale daily.      . traMADol (ULTRAM) 50 MG tablet Take 1-2 tablets (50-100 mg total) by mouth every 4 (four) hours as needed for moderate pain.  30 tablet  0  . warfarin (COUMADIN) 5 MG tablet Take 1 tablet (5 mg total) by mouth daily at 6 PM.  30 tablet  3  . diltiazem (CARDIZEM CD) 120 MG 24 hr capsule Take 1 capsule (120 mg total) by mouth daily.  30 capsule  6   No current facility-administered medications for this visit.    Past Medical History  Diagnosis Date  . IBS (irritable bowel syndrome)   . Hyperlipidemia   .  Allergic rhinitis   . COPD (chronic obstructive pulmonary disease)   . Asthma   . Lung nodule     RLL nodule-2mm stable 2006, April 2009, and June 2009  . Candidiasis of breast   . Anemia   . Hyperlipemia   . Obesity   . Unspecified essential hypertension   . Sleep difficulties   . Hemorrhoids   . Sleep apnea     PT TOLD BORDERLINE-TRIED CPAP-DID NOT HELP-SHE DOES NOT USE CPAP  . Arthritis   . 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 History of radiation therapy 05/30/11 to 07/07/11    rectum  . Prophylactic chemotherapy     Will finish chemo on 03/04/12  . Pneumonia     hx of several times   . Shingles   . rectal ca 04/2011  . Severe aortic valve stenosis  AVR 3/15 10/01/2013  . Shortness of breath   . Rheumatic heart disease mitral stenosis   . PONV (postoperative nausea and  vomiting)   . Anxiety     Past Surgical History  Procedure Laterality Date  . Foot surgery  left foot    hammer toe  . Hand surgery  Bil    carpal tunnel  . Back surgery      2006 SPACER---2007 LUMBAR FUSION  . Tonsillectomy    . Colon resection  08/25/2011    Procedure: COLON RESECTION LAPAROSCOPIC;  Surgeon: Stark Klein, MD;  Location: [facility];  Service: General;  Laterality: N/A;  Laparoscopic Assisted Low Anterior Resection Diverting Ostomy and onQ pain pump  . Ileostomy  08/25/2011  . Colon surgery    . Ileostomy closure  04/02/2012    Procedure: ILEOSTOMY TAKEDOWN;  Surgeon: Stark Klein, MD;  Location: [facility];  Service: General;  Laterality: N/A;  . Bowel resection  04/02/2012    Procedure: SMALL BOWEL RESECTION;  Surgeon: Stark Klein, MD;  Location: [facility];  Service: General;  Laterality: N/A;  . Carpal tunnel release    . Cardiac catheterization  11/27/2013  . Tee without cardioversion N/A 11/29/2013    Procedure: TRANSESOPHAGEAL ECHOCARDIOGRAM (TEE);  Surgeon: Sueanne Margarita, MD;  Location: [facility] ENDOSCOPY;  Service: Cardiovascular;  Laterality: N/A;  . Cardiac valve replacement    . Aortic valve replacement N/A 12/12/2013    Procedure: AORTIC VALVE REPLACEMENT (AVR);  Surgeon: Gaye Pollack, MD;  Location: [place];  Service: Open Heart Surgery;  Laterality: N/A;  . Mitral valve replacement N/A 12/12/2013    Procedure: MITRAL VALVE (MV) REPLACEMENT OR REPAIR;  Surgeon: Gaye Pollack, MD;  Location: [place];  Service: Open Heart Surgery;  Laterality: N/A;  . Intraoperative transesophageal echocardiogram N/A 12/12/2013    Procedure: INTRAOPERATIVE TRANSESOPHAGEAL ECHOCARDIOGRAM;  Surgeon: Gaye Pollack, MD;  Location: [facility] OR;  Service: Open Heart Surgery;  Laterality: N/A;    Family History  Problem Relation Age of Onset  . Lung cancer Father   . Cancer Father     lung  . Diabetes    . Heart disease    . Heart disease Mother   . Diabetes Mother   . Hypertension Mother   . Colon  cancer Neg Hx   . Esophageal cancer Neg Hx   . Stomach cancer Neg Hx   . Rectal cancer Neg Hx     History   Social History  . Marital Status: Married    Spouse Name: N/A    Number of Children: N/A  . Years of Education: N/A   Occupational  History  . retired    Social History Main Topics  . Smoking status: Former Smoker -- 1.50 packs/day for 10 years    Types: Cigarettes    Quit date: 12/27/1974  . Smokeless tobacco: Never Used  . Alcohol Use: No     Comment: rare  . Drug Use: No  . Sexual Activity: No   Other Topics Concern  . Not on file   Social History Narrative   Married 1966   2 sons; 1 grandchild   Retired-[employer]   Doing ok   March '09-financial tough times    Review of systems: She feels that she tires easily and become short of breath but is improving gradually. She no longer has any edema. She denies any chest pain either anginal or musculoskeletal. She has not had syncope and denies dizziness or palpitations. If she exerts herself she does feel that her heart rate picks up quite a bit. She denies abdominal pain and has a reasonably good appetite. She has not had fever or chills or rashes. She has not had overt bleeding complications and denies any urinary complaints. She was to travel to [place] in about 6 weeks for her husbands [employer] reunion  PHYSICAL EXAM BP 130/70  Pulse 96  Resp 20  Ht 5\' 8"  (1.727 m)  Wt 96.163 kg (212 lb)  BMI 32.24 kg/m2  General: Alert, oriented x3, no distress Head: no evidence of trauma, PERRL, EOMI, no exophtalmos or lid lag, no myxedema, no xanthelasma; normal ears, nose and oropharynx Neck: normal jugular venous pulsations and no hepatojugular reflux; brisk carotid pulses without delay and no carotid bruits Chest: Well-healed sternotomy scar clear to auscultation, no signs of consolidation by percussion or palpation, normal fremitus, symmetrical and full respiratory excursions Cardiovascular: normal position and quality of the  apical impulse, regular rhythm, normal first and second heart sounds, no murmurs, rubs or gallops Abdomen: no tenderness or distention, no masses by palpation, no abnormal pulsatility or arterial bruits, normal bowel sounds, no hepatosplenomegaly Extremities: no clubbing, cyanosis or edema; 2+ radial, ulnar and brachial pulses bilaterally; 2+ right femoral, posterior tibial and dorsalis pedis pulses; 2+ left femoral, posterior tibial and dorsalis pedis pulses; no subclavian or femoral bruits Neurological: grossly nonfocal.  Lipid Panel     Component Value Date/Time   CHOL 181 07/05/2013 0850   TRIG 117 07/05/2013 0850   HDL 51 07/05/2013 0850   CHOLHDL 3.5 07/05/2013 0850   VLDL 23 07/05/2013 0850   LDLCALC 107* 07/05/2013 0850    BMET    Component Value Date/Time   NA 139 12/21/2013 0513   NA 141 07/23/2013 1303   NA 142 03/15/2012 0923   K 4.0 12/21/2013 0513   K 4.5 07/23/2013 1303   K 4.6 03/15/2012 0923   CL 94* 12/21/2013 0513   CL 104 11/15/2012 0908   CL 103 03/15/2012 0923   CO2 32 12/21/2013 0513   CO2 26 07/23/2013 1303   CO2 29 03/15/2012 0923   GLUCOSE 90 12/21/2013 0513   GLUCOSE 81 07/23/2013 1303   GLUCOSE 93 11/15/2012 0908   GLUCOSE 105 03/15/2012 0923   BUN 20 12/21/2013 0513   BUN 22.6 07/23/2013 1303   BUN 18 03/15/2012 0923   CREATININE 1.10 12/21/2013 0513   CREATININE 0.9 07/23/2013 1303   CREATININE 1.02 07/05/2013 0850   CALCIUM 9.3 12/21/2013 0513   CALCIUM 9.4 07/23/2013 1303   CALCIUM 9.0 03/15/2012 0923   GFRNONAA 50* 12/21/2013 0513   GFRAA 58*  12/21/2013 0513     ASSESSMENT AND PLAN  [name] appears to be recovering nicely from her surgery. She is a little tachycardic and anemia and tachycardia may cause increased gradients across the mitral valve prosthesis. She is probably still anemic. Will recheck her hemoglobin today. She may have appropriate increase in heart rate due to the anemia, I think will benefit from restarting diltiazem which she was actually taking  preoperatively. There is no evidence of heart block at this point. Refer to cardiac rehabilitation. Check an echocardiogram to see what the baseline gradients are crossed and the valves I think there is a good chance should be able to make her trip to [place] in mid May, but stopped short of making this recommendation at this point. She is due to see Dr. Cyndia Bent in a few days  Orders Placed This Encounter  Procedures  . Comprehensive metabolic panel  . CBC  . 2D Echocardiogram without contrast   Meds ordered this encounter  Medications  . diltiazem (CARDIZEM CD) 120 MG 24 hr capsule    Sig: Take 1 capsule (120 mg total) by mouth daily.    Dispense:  30 capsule    Refill:  [place] Diane Geathers, MD, [facility] [phone] office [phone] pager

## 2014-01-08 NOTE — Progress Notes (Signed)
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 The patient's early postoperative recovery while in the hospital was notable for an uncomplicated postop course. Since hospital discharge the patient reports that she has been feeling progressively better. She is walking without dyspnea or chest discomfort. Her stamina is improving weekly with PT. She is ready to start cardiac rehab.   Current Outpatient Prescriptions  Medication Sig Dispense Refill  . albuterol (PROVENTIL HFA;VENTOLIN HFA) 108 (90 BASE) MCG/ACT inhaler Inhale 2 puffs into the lungs every 6 (six) hours as needed for wheezing.      Marland Kitchen aspirin 81 MG tablet Take 81 mg by mouth at bedtime.       . Calcium Carbonate-Vitamin D (CALCIUM 600 + D PO) Take 1,200 mg by mouth at bedtime.       Marland Kitchen diltiazem (CARDIZEM CD) 120 MG 24 hr capsule Take 1 capsule (120 mg total) by mouth daily.  30 capsule  6  . Fluticasone Furoate-Vilanterol (BREO ELLIPTA) 100-25 MCG/INH AEPB Inhale 1 puff into the lungs daily.  30 each  5  . furosemide (LASIX) 40 MG tablet Take 1 tablet (40 mg total) by mouth daily.  30 tablet  5  . lovastatin (MEVACOR) 40 MG tablet Take 40 mg by mouth at bedtime.      . Miconazole Nitrate 2 % POWD 1 application by Does not apply route daily. Apply to stomach and under breasts      . montelukast (SINGULAIR) 10 MG tablet Take 10 mg by mouth at bedtime.      . pantoprazole (PROTONIX) 40 MG tablet Take 40 mg by mouth at bedtime.       . polyethylene glycol (MIRALAX / GLYCOLAX) packet Take 17 g by mouth daily.  14 each  0  . potassium chloride SA (K-DUR,KLOR-CON) 20 MEQ tablet Take 1 tablet (20 mEq total) by mouth daily.  30 tablet  5  . tiotropium (SPIRIVA) 18 MCG inhalation capsule Place 18 mcg into inhaler and inhale daily.      . traMADol (ULTRAM) 50 MG tablet Take 1-2 tablets (50-100 mg total) by mouth every 4 (four) hours  as needed for moderate pain.  30 tablet  0  . warfarin (COUMADIN) 5 MG tablet Take 1 tablet (5 mg total) by mouth daily at 6 PM.  30 tablet  3   No current facility-administered medications for this visit.    Physical Exam: BP 125/75  Pulse 102  Resp 20  Ht 5\' 8"  (1.727 m)  Wt 212 lb (96.163 kg)  BMI 32.24 kg/m2  SpO2 98% She looks well. Lung exam is clear. Cardiac exam shows a regular rate and rhythm with normal valve sounds. There is a slight systolic flow murmur across the aortic valve prosthesis. Chest incision is healing well and sternum is stable. There is no peripheral edema.   Diagnostic Tests:  CLINICAL DATA: Aortic valve replacement 4 weeks ago, followup,  fatigue  EXAM:  CHEST 2 VIEW  COMPARISON: Chest x-ray of 12/20/2013  FINDINGS:  Aeration of the lung bases has improved. Only a small right pleural  effusion remains with mild right basilar atelectasis. Cardiomegaly  is stable and both aortic and mitral valve replacements are noted.  There are degenerative changes throughout the thoracic spine.  IMPRESSION:  Improved aeration with only a small right pleural effusion  remaining. Stable cardiomegaly  Electronically Signed  By: Windy Canny.D.  On: 01/08/2014 09:52    Impression:  Overall I think she is doing well. I encouraged him to continue walking. She is planning to participate in cardiac rehab. I told her she could drive her car but should not lift anything heavier than 10 lbs for three months postop. Her INR is therapeutic. She will need to continue coumadin for at least 3 months with a tissue mitral valve and aortic valve but she can be switched to aspirin alone after that. Her INR is being followed in the anticoagulation clinic.   Plan:  She will continue to follow up with Dr. Sallyanne Kuster and will contact me if she has any problems with her incisions.

## 2014-01-08 NOTE — Patient Instructions (Addendum)
Your physician recommends that you return for lab work in: AT [place].  START DILTIAZEM 120MG  DAILY.  Your physician has requested that you have an echocardiogram IN MAY 2015. Echocardiography is a painless test that uses sound waves to create images of your heart. It provides your doctor with information about the size and shape of your heart and how well your heart's chambers and valves are working. This procedure takes approximately one hour. There are no restrictions for this procedure.  Your physician recommends that you schedule a follow-up appointment in: 3 MONTHS.

## 2014-01-10 ENCOUNTER — Ambulatory Visit (HOSPITAL_BASED_OUTPATIENT_CLINIC_OR_DEPARTMENT_OTHER): Payer: Medicare Other | Admitting: Oncology

## 2014-01-10 ENCOUNTER — Other Ambulatory Visit (HOSPITAL_BASED_OUTPATIENT_CLINIC_OR_DEPARTMENT_OTHER): Payer: Medicare Other

## 2014-01-10 ENCOUNTER — Telehealth: Payer: Self-pay | Admitting: Oncology

## 2014-01-10 VITALS — BP 121/65 | HR 98 | Temp 97.7°F | Resp 18 | Ht 68.0 in | Wt 212.2 lb

## 2014-01-10 DIAGNOSIS — C20 Malignant neoplasm of rectum: Secondary | ICD-10-CM

## 2014-01-10 DIAGNOSIS — J449 Chronic obstructive pulmonary disease, unspecified: Secondary | ICD-10-CM

## 2014-01-10 LAB — CEA: CEA: <0.5 ng/mL (ref 0.0–5.0)

## 2014-01-10 NOTE — Progress Notes (Signed)
  [facility] OFFICE PROGRESS NOTE   Diagnosis: Rectal cancer  INTERVAL HISTORY:   She returns for scheduled followup of rectal cancer. She underwent aortic and mitral valve replacements on 12/12/2013. She is recovering from surgery. She felt well prior to surgery. Good appetite. No difficulty with bowel function.  Restaging CTs of the chest, abdomen, and pelvis on 07/23/2013 revealed no evidence of recurrent rectal cancer. A CT of the abdomen and pelvis 08/29/2013 for evaluation of nausea revealed evidence of a partial small bowel obstruction at the anastomotic site. She was admitted and her symptoms resolved with bowel rest.  Objective:  Vital signs in last 24 hours:  Blood pressure 121/65, pulse 98, temperature 97.7 F (36.5 C), temperature source Oral, resp. rate 18, height 5\' 8"  (1.727 m), weight 212 lb 3.2 oz (96.253 kg).    HEENT: Neck without mass Lymphatics: No cervical, supra-clavicular, axillary, or inguinal nodes Resp: Decreased breath sounds with end inspiratory rhonchi at the posterior bases bilaterally, no respiratory distress Cardio: Regular rate and rhythm GI: No hepatomegaly, no mass Vascular: No leg edema  Skin: Multiple ecchymoses at the low abdominal wall, yeast rash in the groin      Lab Results:  Lab Results  Component Value Date   CEA <0.5 06/05/2013    Imaging:  Dg Chest 2 View  01/08/2014   CLINICAL DATA:  Aortic valve replacement 4 weeks ago, followup, fatigue  EXAM: CHEST  2 VIEW  COMPARISON:  Chest x-ray of 12/20/2013  FINDINGS: Aeration of the lung bases has improved. Only a small right pleural effusion remains with mild right basilar atelectasis. Cardiomegaly is stable and both aortic and mitral valve replacements are noted. There are degenerative changes throughout the thoracic spine.  IMPRESSION: Improved aeration with only a small right pleural effusion remaining. Stable cardiomegaly   Electronically Signed   By: Ivar Drape [name]    On: 01/08/2014 09:52    Medications: I have reviewed the patient's current medications.  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 Ileostomy takedown 04/02/2012  3. COPD  4. admission with a partial small bowel obstruction November 2014, resolved with bowel rest 5. aortic and mitral valve replacements 12/12/2013   Disposition:  Ms. Riccardi remains in clinical remission from rectal cancer. We will followup on the CEA from today. She will return for an office visit and CEA in 6 months. We will discuss the indication for additional surveillance CT scans when she returns in 6 months.  [name], MD  01/10/2014  8:55 AM

## 2014-01-10 NOTE — Telephone Encounter (Signed)
gv and printed appt sched and avs for pt fro OCT.. °

## 2014-01-15 ENCOUNTER — Ambulatory Visit (INDEPENDENT_AMBULATORY_CARE_PROVIDER_SITE_OTHER): Payer: Medicare Other | Admitting: Pharmacist Clinician (PhC)/ Clinical Pharmacy Specialist

## 2014-01-15 DIAGNOSIS — Z954 Presence of other heart-valve replacement: Secondary | ICD-10-CM

## 2014-01-15 DIAGNOSIS — Z952 Presence of prosthetic heart valve: Secondary | ICD-10-CM

## 2014-01-15 LAB — POCT INR: INR: 3.1

## 2014-01-17 ENCOUNTER — Other Ambulatory Visit: Payer: Self-pay

## 2014-01-17 DIAGNOSIS — Z1231 Encounter for screening mammogram for malignant neoplasm of breast: Secondary | ICD-10-CM

## 2014-01-20 ENCOUNTER — Encounter: Payer: Medicare Other | Admitting: Pharmacist Clinician (PhC)/ Clinical Pharmacy Specialist

## 2014-01-22 ENCOUNTER — Telehealth: Payer: Self-pay | Admitting: *Deleted

## 2014-01-22 LAB — POCT INR: INR: 3.1

## 2014-01-23 ENCOUNTER — Encounter (HOSPITAL_COMMUNITY)
Admission: RE | Admit: 2014-01-23 | Discharge: 2014-01-23 | Disposition: A | Discharge status: Home / Self Care | Payer: Medicare Other | Source: Ambulatory Visit | Attending: Internal Medicine | Admitting: Internal Medicine

## 2014-01-23 ENCOUNTER — Ambulatory Visit (INDEPENDENT_AMBULATORY_CARE_PROVIDER_SITE_OTHER): Payer: Medicare Other | Admitting: Pharmacist Clinician (PhC)/ Clinical Pharmacy Specialist

## 2014-01-23 DIAGNOSIS — I080 Rheumatic disorders of both mitral and aortic valves: Secondary | ICD-10-CM | POA: Insufficient documentation

## 2014-01-23 DIAGNOSIS — Z952 Presence of prosthetic heart valve: Secondary | ICD-10-CM

## 2014-01-23 DIAGNOSIS — I369 Nonrheumatic tricuspid valve disorder, unspecified: Secondary | ICD-10-CM | POA: Insufficient documentation

## 2014-01-23 DIAGNOSIS — Z954 Presence of other heart-valve replacement: Secondary | ICD-10-CM

## 2014-01-23 NOTE — Progress Notes (Signed)
Cardiac Rehab Medication Review by a Pharmacist  Does the patient  feel that his/her medications are working for him/her?  yes  Has the patient been experiencing any side effects to the medications prescribed?  Yes - nausea  Does the patient measure his/her own blood pressure or blood glucose at home?  Yes - home health nurse checks   Does the patient have any problems obtaining medications due to transportation or finances?   no  Understanding of regimen: good Understanding of indications: good Potential of compliance: good  Pharmacist comments: Patient has a good understanding of her medications. She brought in her medication bottles today. She reports some nausea over the past couple of weeks. She believes it is possibly from being dehydrated so she has been drinking more fluids which has seemed to help.   Roderic Palau A. Pincus Badder, PharmD Clinical Pharmacist - Resident Pager: [phone] Pharmacy: [phone] 01/23/2014 8:29 AM

## 2014-01-27 ENCOUNTER — Ambulatory Visit
Admission: RE | Admit: 2014-01-27 | Discharge: 2014-01-27 | Disposition: A | Discharge status: Home / Self Care | Payer: Self-pay | Source: Ambulatory Visit

## 2014-01-27 DIAGNOSIS — Z1231 Encounter for screening mammogram for malignant neoplasm of breast: Secondary | ICD-10-CM

## 2014-01-28 ENCOUNTER — Telehealth (HOSPITAL_COMMUNITY): Payer: Self-pay | Admitting: *Deleted

## 2014-01-29 ENCOUNTER — Ambulatory Visit (INDEPENDENT_AMBULATORY_CARE_PROVIDER_SITE_OTHER): Payer: Medicare Other | Admitting: Pharmacist Clinician (PhC)/ Clinical Pharmacy Specialist

## 2014-01-29 ENCOUNTER — Telehealth: Payer: Self-pay | Admitting: *Deleted

## 2014-01-29 ENCOUNTER — Encounter: Payer: Self-pay | Admitting: Internal Medicine

## 2014-01-29 ENCOUNTER — Ambulatory Visit (INDEPENDENT_AMBULATORY_CARE_PROVIDER_SITE_OTHER): Payer: Medicare Other | Admitting: Internal Medicine

## 2014-01-29 VITALS — BP 140/80 | HR 92 | Ht 68.0 in | Wt 212.8 lb

## 2014-01-29 DIAGNOSIS — R0609 Other forms of dyspnea: Secondary | ICD-10-CM

## 2014-01-29 DIAGNOSIS — J4489 Other specified chronic obstructive pulmonary disease: Secondary | ICD-10-CM

## 2014-01-29 DIAGNOSIS — Z954 Presence of other heart-valve replacement: Secondary | ICD-10-CM

## 2014-01-29 DIAGNOSIS — J449 Chronic obstructive pulmonary disease, unspecified: Secondary | ICD-10-CM

## 2014-01-29 DIAGNOSIS — R0600 Dyspnea, unspecified: Secondary | ICD-10-CM

## 2014-01-29 DIAGNOSIS — Z952 Presence of prosthetic heart valve: Secondary | ICD-10-CM

## 2014-01-29 DIAGNOSIS — R0989 Other specified symptoms and signs involving the circulatory and respiratory systems: Secondary | ICD-10-CM

## 2014-01-29 LAB — POCT INR: INR: 3.2

## 2014-01-29 MED ORDER — DOXYCYCLINE HYCLATE 100 MG PO TABS: 100.0000 mg | ORAL_TABLET | Freq: Two times a day (BID) | ORAL | Status: DC

## 2014-01-29 MED ORDER — METHYLPREDNISOLONE 4 MG PO KIT: PACK | ORAL | Status: DC

## 2014-01-29 NOTE — Patient Instructions (Addendum)
Most likely her shortness of breath is related to postoperative fatigue and deconditioning However, in case there is a heart issue I will let Dr. Loletha Grayer. know through electronic message Okay to go to [place] from a pulmonary standpoint in May 2015 In case you've COPD flareup while in [place] in May 2015  - Take doxycycline 100mg  po twice daily x 5 days; take after meals and avoid sunlight  - Take medrol dose pak  -we are doing these scripts so save time  Followup  - 4-5 months or sooner if needed

## 2014-01-29 NOTE — Telephone Encounter (Signed)
Adela Glimpse was calling in regards to having more visits aloted to her for Ms. Brick.

## 2014-01-29 NOTE — Progress Notes (Signed)
Subjective:    Patient ID: [name], female    DOB: 1945-12-15, 68 y.o.   MRN: [id number]  HPI    #Obese  Body mass index is 34.85 kg/(m^2). Body mass index is 35.98 kg/(m^2). - 05/03/2013 Body mass index is 35.25 kg/(m^2). on 06/25/2013      #Ex 15 pack smoker. Quit 1970s  #COPD/ASthma - Baseline CAT score 18 in May 2013 (poor score is 4 sleep quality, level 3 for dyspnea/fatigue)  #Pulmonary nodules  -  Colon Cancer during cancer Rx had CT chest Oct 2012 and pulmonary nodules noted  - October 2012: There is an irregular nodule in the right upper lobe measuring 12 mm which is unchanged from prior and has adjacent bronchiectasis also unchanged. Within the left lower lobe, there is a 4 mm pulmonary nodule (image 30) which is unchanged.  - October 2013: No change on CT chest Oct 2013   #colon Cancer  - 2012 (early) - s/p surgery and chemo  #Arctic valve stenosis  0 Dr. Rollene Fare  #Exacerbations of obstructive lung disease   - November 2013: Treatment from office with Levaquin and Medrol Dosepak  - March 2-14: Prednisone opd for flare up     OV .01/01/2013  Follows up for COPD/asthma.  - She is reporting chronic worsening of dyspnea for the last several months but the worsening is mild overall that she feels stable. In fact on COPD cat score is only 9 and is much better than baseline of 18 and also much improved compared to 38 at last visit in November 2013 when she was in an exacerbation. She says that her aortic  valve stenosis is stable and is being monitored by [facility] cardiology Dr. Rollene Fare. She feels deconditioned and is wondering if she needs to be attend pulmonary rehabilitation maintenance program. She denies any overt wheezing or worsening cough or sputum but on exam she was actively wheezing this visit   REC  #COPD  - You have mild wheezing even though you feel well  Take prednisone 40 mg daily x 2 days, then 20mg  daily x 2 days, then 10mg   daily x 2 days, then 5mg  daily x 2 days and stop  I will re\re refer you to pulmonary rehabilitation, you can start that for maintenance program  Start N. acetylcysteine 600 mg twice a day  - [place] study published in [place] in 2014 shows it helps  - this is not to make you feel better but to to prevent recurrent bronchitis episodes  - You are more likely to get this drug at [facility] or a vitamin store then at [employer] or [place] or [employer]  - You can also get this at website [employer]  - It functions as an anti-oxidant and there are minimal/none side effects  #Followup  4-5 months with COPD cat score at followup   OV 05/03/2013  FU for copd/asthma  - Has been told that AS is severe and needs AVR. SO she has joined rehab. Has seen dr Cyndia Bent. COPD itrself is stable. No AECOPD. Continues with mdi. Feels good. Last time she was asymtoatic and we gave pred becuaes of wheeeon exam. THis timme too though baseline has occ wheeze despite spiriva and advair.  COPD CAT score is 19 but says this is stable.   HEr obesity continues to be a problem; says she is trying not to eat and joining rehab but unable to lose weight. I counseled her about diet and she rolled her  eyes when I suggested list of foods to eat and list of foods to avoid. She acknowledged that it would be difficult for her to make a change. She thinks that weight loss involves eating less. She does not realize that weight loss involves eating different which he admits is a harder challenge      Past, Family, Social reviewed: Has seen Dr Cyndia Bent for aortic stenosis and has been recommended valve replacement   #COPD  - some wheezing depsite subjective baseline state - continue spiriva  - chagne ADVAIR to BREO 1 puff daily - flu shot in fall  - continue rehab  #Weight mgmt #WEight Management    - we discussed extensively about weight management   - follow low glycemic diet plan that I outlined for you after  extensive discussion. Do not follow other plans  #ROV 2 months    OV 06/25/2013 Followup COPD and weight management of obesity  - Terms of weight management: She has not lost any weight. She flatly told me that she's not interested in the low glycemic diet because it is too difficult.  - Terms of COPD: She's not taking Spiriva and Brio. After change to Brio the COPD cat score is improved and is now 12.5. She feels a COPD status is stable. She is compliant with her medications. Especially Spiriva and Brio. She has not had a flu shot but will have it at her work at [employer] hospital. She wants Pneumovax administered today.  In terms of her attic stenosis: She does not know her surgery date and a followup with her cardiologist is pending    10/22/2013 [facility] follow up  Patient returns for a post hospital followup. Patient was admitted January 1 through January 6 for pneumococcal pneumonia, and COPD, exacerbation. Her condition is complicated by severe aortic valve stenosis, and acute on chronic congestive heart failure. CXR showed RLL infiltrate.  Her urine strep was positive. Ultimately her respiratory status improved aggressive IV antibiotics, nebulized bronchodilators, IV steroids, and diuresis. Patient's troponin was elevated. She was seen by cardiology and has an outpatient catheter planned She was discharged on Levaquin and a Medrol Dosepak. She has finished both of these prescriptions. 2-D echo showed an ejection fracture of 65%, severe aortic stenosis, severe mitral valve calcification and mildly dilated. Right ventricle. Since discharge, she is feeling some better but still weak.  CXR today shows some improvement in RLL infiltrate.  Weight is down 16 pounds. Patient says that she has no lower showed a swelling. She denies any hemoptysis, chest pain, orthopnea, PND, or fever Appetite is slowly returning. She has no nausea, vomiting, diarrhea.    OV 11/19/2013 Chief Complaint   Patient presents with  . COPD    Pt c/o voice loss that started today. Pt denies any cough, congestion, chest pain, tightness.     Followup COPD in the setting of obesity and severe aortic stenosis  She continues to get better overall but still feeling weak and deconditioned. In fact COPD cat score is higher than baseline at 20. She does not have any COPD exacerbation symptoms although she does have some hoarseness of voice that started today. She feels that this might be a precursor to impending COPD exacerbation but she actively denies worsening cough or sputum production of fever or chills. Chest x-ray today shows mild residual right lower lobe infiltrate and a right small effusion  In terms of her aortic stenosis: She's having a cardiac cath at the end of this  month every 2015. This is been postponed to this date because of her pneumococcal pneumonia. She is thinking about postponing her aortic valve surgery to May 2015. I've told her that she should consider getting this done as early as possible instead of trying to postpone issue   Dg Chest 2 View  11/19/2013   CLINICAL DATA:  Followup pneumonia, shortness of breath, history COPD, hypertension, asthma  EXAM: CHEST  2 VIEW  COMPARISON:  10/22/2013  FINDINGS: Enlargement of cardiac silhouette.  Mediastinal contours and pulmonary vascularity normal.  Minimal right apical pleural thickening stable.  Emphysematous and bronchitic changes consistent with COPD.  Mild residual right basilar infiltrate.  Minimal scarring at left base.  Remaining lungs clear.  No pneumothorax.  Question minimal right pleural effusion.  Mitral annular calcification again noted.  IMPRESSION: COPD changes with mild residual infiltrate at right base and question minimal right pleural effusion.   Electronically Signed   By: Lavonia Dana [name]   On: 11/19/2013 16:07     OV 01/29/2014  Chief Complaint  Patient presents with  . COPD    follow-up. Pt states she is having  more SOB since discharge. Pt is also having some hoarseness today.      Followup obstructive lung disease: COPD/asthma  Since I last saw her she had direct valve and mitral valve surgery. She has done well. Apparently the surgery was long and complicated due to extensive calcification of the aortic valve. She was anemic postoperatively but since then her hemoglobin is returned 11 g percent on 01/08/2014. She is also followed with cardiology Dr. Loletha Grayer. on 01/08/2014 and it appears based on his notes that she is doing well. Cardiac rehabilitation postoperative is still pending. Her main issue is that she is fatigued and shortness of breath with exertion but apparently she does not desaturate but gets easily tachycardic. She is worried about this but denies any active cough or wheezing. COPD cat score is 14 and a slightly better than baseline and it appears the most significant component of this is dyspnea and fatigue with cough being minimal as before. Most recent chest x-ray the first 2015 is clear except for minimal right pleural effusion.  Walking desaturation test her and made her feet x3 laps on room air: Resting pulse ox was 96% he did resting heart rate was 90 per minute. She was only able to walk 2 laps out of the 3 and stop due to dyspnea and fatigue. Resting pulse ox was stable at 97%. However heart rate jumped up 120 per minute. At one point the machine even picked up a heart rate of 150 per minute but this is very transient  Past and social history reviewed: This is as above but also the fact she is going to [place] in [place] in [place] and she wants COPD exacerbation antibiotics  CAT COPD Symptom & Quality of Life Score ([place] trademark) 0 is no burden. 5 is highest burden 09/04/2012 Exacerbation  01/01/2013  05/03/2013  06/25/2013 With breo 11/19/2013  01/29/2014   Never Cough -> Cough all the time 4 2 2  1.5 1 1   No phlegm in chest -> Chest is full of phlegm 4 0.5 1 0.5 1 1   No chest  tightness -> Chest feels very tight 5 0.5 3 1.5 2 0  No dyspnea for 1 flight stairs/hill -> Very dyspneic for 1 flight of stairs 5 3.5 4 4 5 5   No limitations for ADL at home ->  Very limited with ADL at home 5 1.5 2 2 4 3   Confident leaving home -> Not at all confident leaving home 5 0 1 0 1 0  Sleep soundly -> Do not sleep soundly because of lung condition 5 0 4 0 4 0  Lots of Energy -> No energy at all 5 1 2.5 3 2.5 4  TOTAL Score (max 40)  38 9 19.5 12.5 20.5 14       Review of Systems  Constitutional: Negative for fever and unexpected weight change.  HENT: Negative for congestion, dental problem, ear pain, nosebleeds, postnasal drip, rhinorrhea, sinus pressure, sneezing, sore throat and trouble swallowing.   Eyes: Negative for redness and itching.  Respiratory: Positive for shortness of breath. Negative for cough, chest tightness and wheezing.   Cardiovascular: Negative for palpitations and leg swelling.  Gastrointestinal: Negative for nausea and vomiting.  Genitourinary: Negative for dysuria.  Musculoskeletal: Negative for joint swelling.  Skin: Negative for rash.  Neurological: Negative for headaches.  Hematological: Does not bruise/bleed easily.  Psychiatric/Behavioral: Negative for dysphoric mood. The patient is not nervous/anxious.        Objective:   Physical Exam  Vitals reviewed. Constitutional: She is oriented to person, place, and time. She appears well-developed and well-nourished. No distress.  HENT:  Head: Normocephalic and atraumatic.  Right Ear: External ear normal.  Left Ear: External ear normal.  Mouth/Throat: Oropharynx is clear and moist. No oropharyngeal exudate.  Eyes: Conjunctivae and EOM are normal. Pupils are equal, round, and reactive to light. Right eye exhibits no discharge. Left eye exhibits no discharge. No scleral icterus.  Neck: Normal range of motion. Neck supple. No JVD present. No tracheal deviation present. No thyromegaly present.   Cardiovascular: Normal rate, regular rhythm, normal heart sounds and intact distal pulses.  Exam reveals no gallop and no friction rub.   No murmur heard. Pulmonary/Chest: Effort normal and breath sounds normal. No respiratory distress. She has no wheezes. She has no rales. She exhibits no tenderness.  Scar of surgery +  Abdominal: Soft. Bowel sounds are normal. She exhibits no distension and no mass. There is no tenderness. There is no rebound and no guarding.  Musculoskeletal: Normal range of motion. She exhibits no edema and no tenderness.  Lymphadenopathy:    She has no cervical adenopathy.  Neurological: She is alert and oriented to person, place, and time. She has normal reflexes. No cranial nerve deficit. She exhibits normal muscle tone. Coordination normal.  Skin: Skin is warm and dry. No rash noted. She is not diaphoretic. No erythema. No pallor.  Psychiatric: She has a normal mood and affect. Her behavior is normal. Judgment and thought content normal.          Assessment & Plan:

## 2014-01-30 ENCOUNTER — Other Ambulatory Visit: Payer: Self-pay | Admitting: Internal Medicine

## 2014-01-31 ENCOUNTER — Telehealth: Payer: Self-pay | Admitting: Internal Medicine

## 2014-01-31 ENCOUNTER — Encounter (HOSPITAL_COMMUNITY)
Admission: RE | Admit: 2014-01-31 | Discharge: 2014-01-31 | Disposition: A | Discharge status: Home / Self Care | Payer: Medicare Other | Source: Ambulatory Visit | Attending: Cardiovascular Disease | Admitting: Cardiovascular Disease

## 2014-01-31 ENCOUNTER — Encounter: Payer: Self-pay | Admitting: *Deleted

## 2014-01-31 ENCOUNTER — Other Ambulatory Visit: Payer: Self-pay | Admitting: *Deleted

## 2014-01-31 MED ORDER — LOVASTATIN 40 MG PO TABS: 40.0000 mg | ORAL_TABLET | Freq: Every day | ORAL | Status: DC

## 2014-01-31 MED ORDER — MONTELUKAST SODIUM 10 MG PO TABS: 10.0000 mg | ORAL_TABLET | Freq: Every day | ORAL | Status: DC

## 2014-01-31 NOTE — Progress Notes (Signed)
Pt started cardiac rehab today.  Pt tolerated light exercise without difficulty. Telemetry rhythm Sinus rhythm Sinus tach with a bundle branch block. This has been previously noted. Vital signs stable today. [name] exercised at a low level today with frequent rest breaks as she did in Pulmonary rehab. Oxygen saturations remained between 96-97% on room air. [name]'s goals are to get stronger and to become less short of breath. Will continue to monitor the patient throughout  the program.

## 2014-01-31 NOTE — Telephone Encounter (Signed)
We received a refill request addressed to Dr Darnell Level for this former patient of Dr Linda Hedges. Per a recent mychart message with Dr Linda Hedges, he recommended she establish with Dr Darnell Level. She has not been seen in our office and had not yet scheduled her new patient appointment. I did authorize a 30 day supply for her, sent her a message via mychart that she will need to schedule an appt with a provider here for any additional refills.

## 2014-01-31 NOTE — Telephone Encounter (Signed)
Spoke with pt, verified that she only needed the generic Singulair sent to her pharmacy.  This has been done.  Nothing further needed.

## 2014-02-01 NOTE — Assessment & Plan Note (Signed)
Most likely her shortness of breath is related to postoperative fatigue and deconditioning However, in case there is a heart issue I will let Dr. Loletha Grayer. know through electronic message Okay to go to [place] from a pulmonary standpoint in May 2015 In case you've COPD flareup while in [place] in May 2015  - Take doxycycline 100mg  po twice daily x 5 days; take after meals and avoid sunlight  - Take medrol dose pak  -we are doing these scripts so save time  Followup  - 4-5 months or sooner if needed  > 50% of this > 25 min visit spent in face to face counseling

## 2014-02-03 ENCOUNTER — Telehealth: Payer: Self-pay | Admitting: Cardiovascular Disease

## 2014-02-03 NOTE — Telephone Encounter (Signed)
Returned call and pt verified x 2.  Pt informed message received.  Pt stated she has been reading on the internet and saw that hair loss can be a SE of Coumadin.  Pt stated she has to attend an event and wants to get a perm, but her beautician suggested she ask if it's okay before getting it.  Pt advised RN would suggest she postpone perm or other chemicals as it may increase hair loss.  Also informed Erasmo Downer, PharmD will be notified to review as well and advise.  Pt verbalized understanding and agreed w/ plan.   Message forwarded to Tommy Medal, PharmD.

## 2014-02-03 NOTE — Telephone Encounter (Signed)
Please call,her hair is coming out. She thinks it might be her Coumadin. She wants to know if it is all right for her to get a perm?a

## 2014-02-04 NOTE — Telephone Encounter (Signed)
Spoke with patient today.  Advised her that it is okay to get her hair permed.  Hair loss from warfarin should subside after being on the medication for awhile.  Pt voiced understanding

## 2014-02-05 ENCOUNTER — Encounter (HOSPITAL_COMMUNITY)
Admission: RE | Admit: 2014-02-05 | Discharge: 2014-02-05 | Disposition: A | Discharge status: Home / Self Care | Payer: Medicare Other | Source: Ambulatory Visit | Attending: Cardiovascular Disease | Admitting: Cardiovascular Disease

## 2014-02-05 IMAGING — CR DG CHEST 1V PORT
1 series · 1 of 1 positions shown · non-contrast
Comparison: 11/15/2012

CLINICAL DATA: Shortness of breath and emesis.

EXAM:
PORTABLE CHEST - 1 VIEW

[AP]
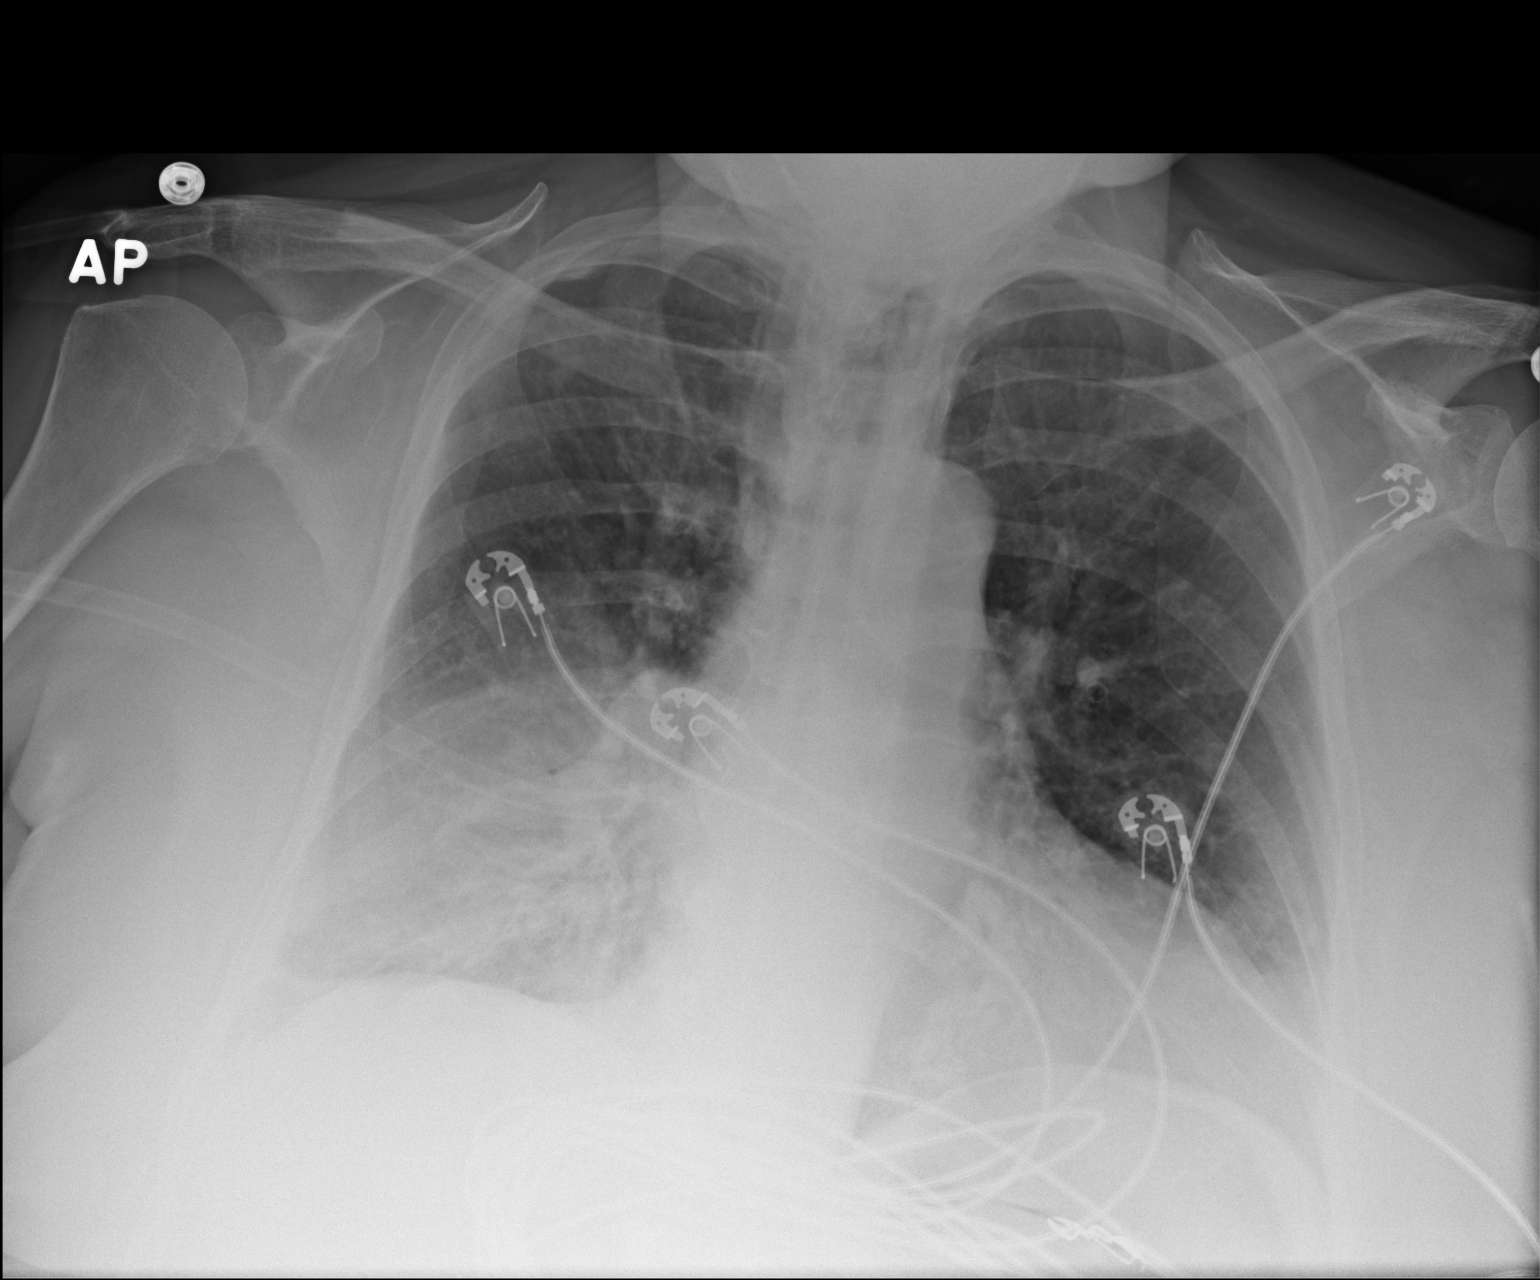

[1 of 1 positions shown; findings below may reference images not displayed]

FINDINGS: Patient is rotated to the left. Lungs are adequately inflated and
demonstrate moderate consolidation over the right base which may be
due to 8 pneumonia versus layering pleural fluid with atelectasis.
Mild stable cardiomegaly. Remainder the exam is unchanged.
IMPRESSION: Moderate opacification over the right mid to lower lung which may be
due to a pneumonia versus layering pleural effusion/atelectasis.

Stable cardiomegaly.

## 2014-02-05 NOTE — Progress Notes (Signed)
Quality of life questionnaire reviewed with the patient  This afternoon.

## 2014-02-06 LAB — POCT INR: INR: 2.4

## 2014-02-06 IMAGING — DX DG CHEST 1V PORT
1 series · 1 of 1 positions shown · non-contrast
Comparison: None.

CLINICAL DATA: Shortness of breath.

EXAM:
PORTABLE CHEST - 1 VIEW

[portable]
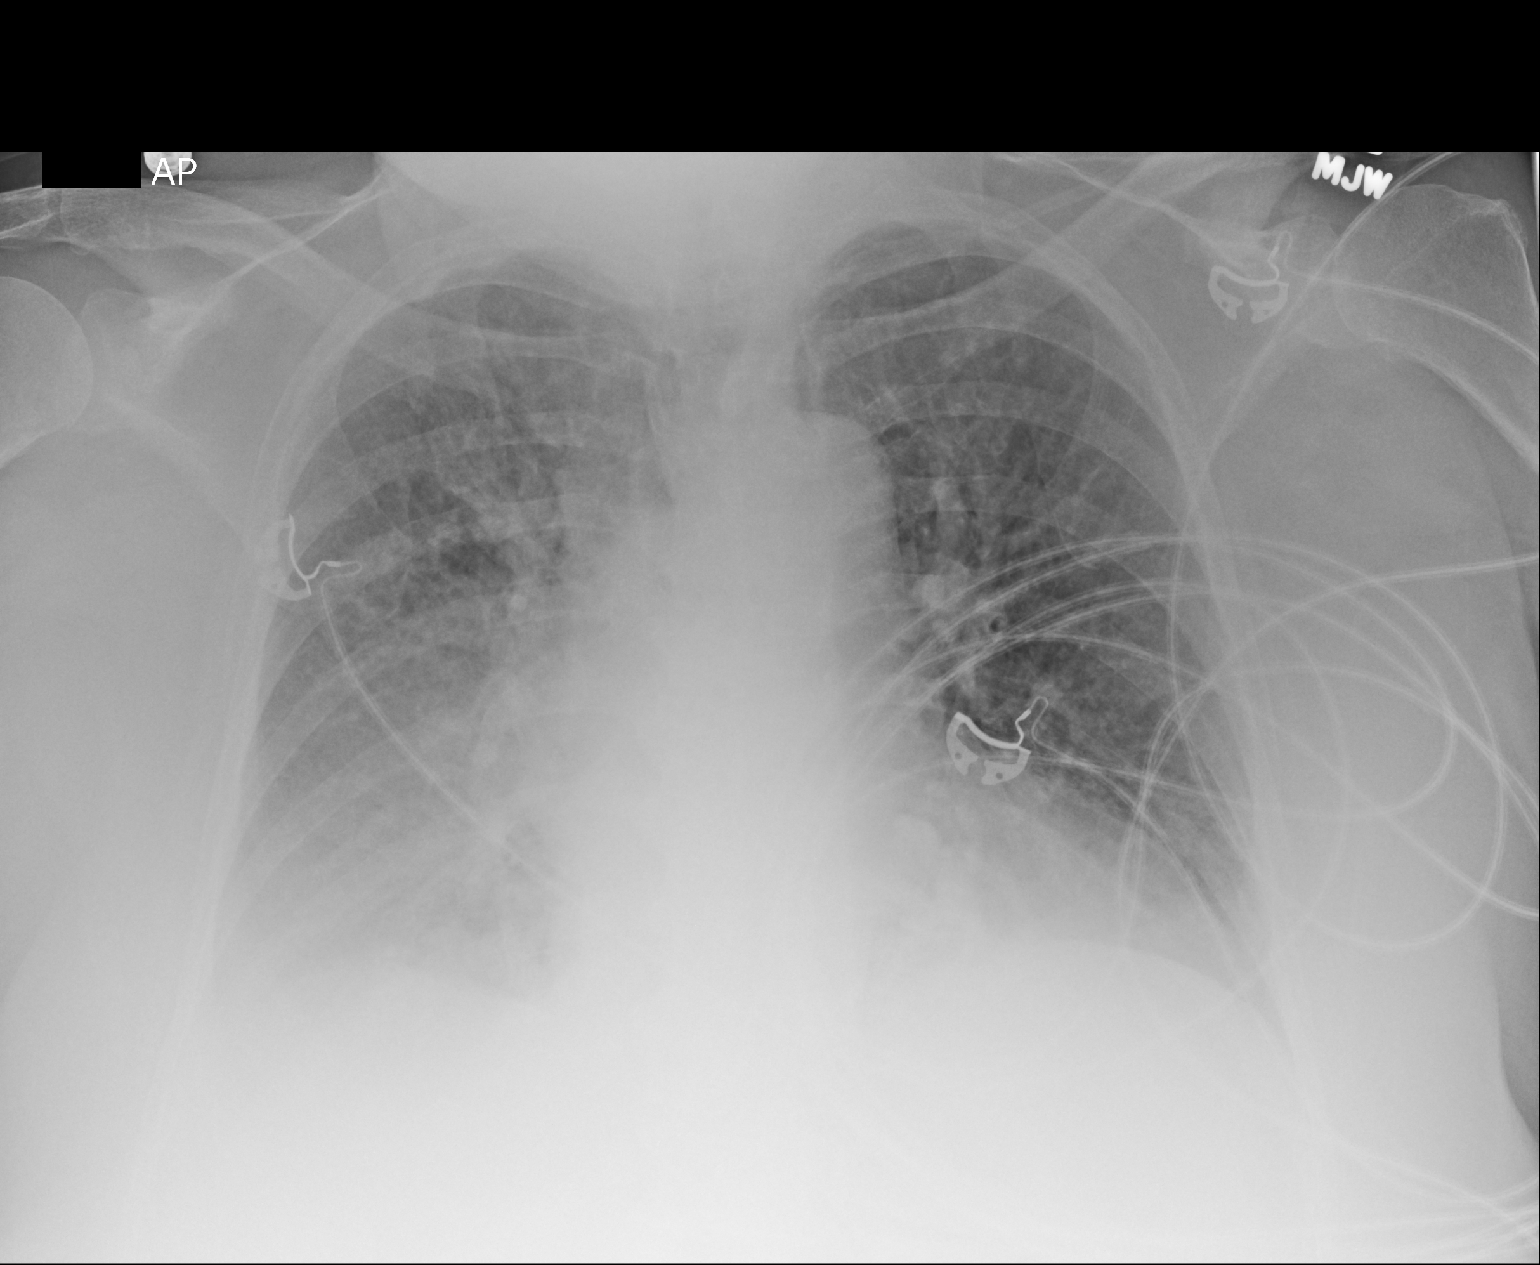

[1 of 1 positions shown; findings below may reference images not displayed]

FINDINGS: Progressive asymmetric airspace disease greater on right. This may
represent pulmonary edema. Right lower lobe infectious infiltrate
may also be present. Clinical correlation and followup until
clearance recommended.

Cardiomegaly with prominent mitral valve calcifications.

Mildly tortuous aorta.

No gross pneumothorax.
IMPRESSION: Progressive asymmetric airspace disease greater on right. This may
represent pulmonary edema. Right lower lobe infectious infiltrate
may also be present. Clinical correlation and followup until
clearance recommended.

## 2014-02-07 ENCOUNTER — Encounter (HOSPITAL_COMMUNITY)
Admission: RE | Admit: 2014-02-07 | Discharge: 2014-02-07 | Disposition: A | Discharge status: Home / Self Care | Payer: Medicare Other | Source: Ambulatory Visit | Attending: Internal Medicine | Admitting: Internal Medicine

## 2014-02-07 ENCOUNTER — Telehealth: Payer: Self-pay | Admitting: Pharmacist Clinician (PhC)/ Clinical Pharmacy Specialist

## 2014-02-07 ENCOUNTER — Ambulatory Visit (INDEPENDENT_AMBULATORY_CARE_PROVIDER_SITE_OTHER): Payer: Medicare Other | Admitting: Pharmacist Clinician (PhC)/ Clinical Pharmacy Specialist

## 2014-02-07 DIAGNOSIS — Z954 Presence of other heart-valve replacement: Secondary | ICD-10-CM

## 2014-02-07 DIAGNOSIS — I369 Nonrheumatic tricuspid valve disorder, unspecified: Secondary | ICD-10-CM | POA: Insufficient documentation

## 2014-02-07 DIAGNOSIS — Z952 Presence of prosthetic heart valve: Secondary | ICD-10-CM

## 2014-02-07 DIAGNOSIS — I080 Rheumatic disorders of both mitral and aortic valves: Secondary | ICD-10-CM | POA: Insufficient documentation

## 2014-02-07 IMAGING — CR DG CHEST 1V PORT
1 series · 1 of 1 positions shown · non-contrast
Comparison: Prior chest x-ray 10/11/2013

CLINICAL DATA: Followup right lower lobe pneumonia

EXAM:
PORTABLE CHEST - 1 VIEW

[AP]
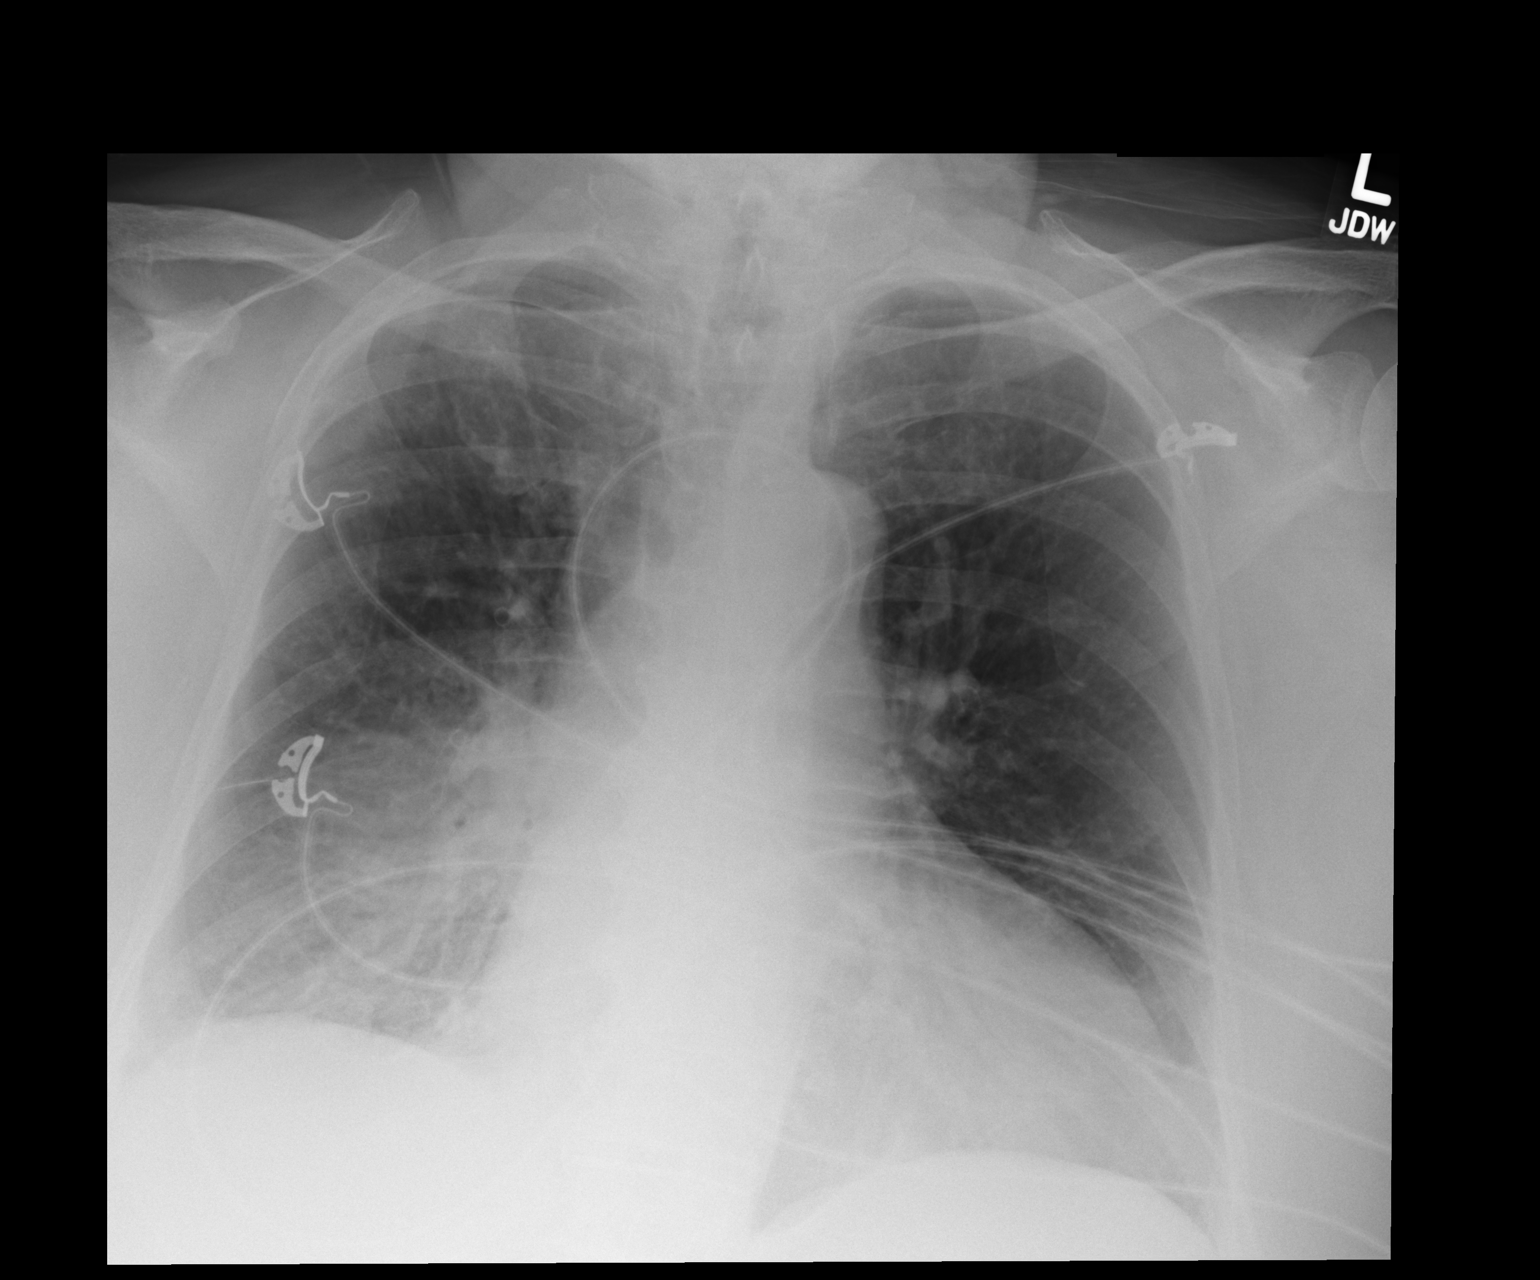

[1 of 1 positions shown; findings below may reference images not displayed]

FINDINGS: Improved pulmonary edema and inspiratory volumes. Persistent right
basilar infiltrate. Mild cardiomegaly. No large pleural effusion. No
pneumothorax.
IMPRESSION: 1. Interval resolution of mild interstitial pulmonary edema.
2. Persistent right basilar infiltrate without significant interval
change.

## 2014-02-07 NOTE — Telephone Encounter (Signed)
See anticoag note

## 2014-02-10 ENCOUNTER — Ambulatory Visit (HOSPITAL_COMMUNITY)
Admission: RE | Admit: 2014-02-10 | Discharge: 2014-02-10 | Disposition: A | Discharge status: Home / Self Care | Payer: Medicare Other | Source: Ambulatory Visit | Attending: Cardiovascular Disease | Admitting: Cardiovascular Disease

## 2014-02-10 ENCOUNTER — Ambulatory Visit (INDEPENDENT_AMBULATORY_CARE_PROVIDER_SITE_OTHER): Payer: Medicare Other | Admitting: Pharmacist Clinician (PhC)/ Clinical Pharmacy Specialist

## 2014-02-10 DIAGNOSIS — Z7901 Long term (current) use of anticoagulants: Secondary | ICD-10-CM

## 2014-02-10 DIAGNOSIS — Z954 Presence of other heart-valve replacement: Secondary | ICD-10-CM | POA: Insufficient documentation

## 2014-02-10 DIAGNOSIS — Z952 Presence of prosthetic heart valve: Secondary | ICD-10-CM

## 2014-02-10 DIAGNOSIS — I059 Rheumatic mitral valve disease, unspecified: Secondary | ICD-10-CM

## 2014-02-10 LAB — POCT INR: INR: 2.7

## 2014-02-10 NOTE — Progress Notes (Signed)
2D Echo Performed 02/10/2014    Javeon Macmurray, RCS  

## 2014-02-11 ENCOUNTER — Telehealth (HOSPITAL_COMMUNITY): Payer: Self-pay | Admitting: *Deleted

## 2014-02-11 ENCOUNTER — Telehealth: Payer: Self-pay | Admitting: *Deleted

## 2014-02-11 ENCOUNTER — Telehealth: Payer: Self-pay | Admitting: Internal Medicine

## 2014-02-11 DIAGNOSIS — R0600 Dyspnea, unspecified: Secondary | ICD-10-CM

## 2014-02-11 NOTE — Telephone Encounter (Signed)
Per Dr. Loletha Grayer echo ordered to assesss LV function because of dyspnea first available.

## 2014-02-11 NOTE — Telephone Encounter (Signed)
Per 4.22.15 ov w/ MR: Patient Instructions     Most likely her shortness of breath is related to postoperative fatigue and deconditioning  However, in case there is a heart issue I will let Dr. Loletha Grayer. know through electronic message  Okay to go to [place] from a pulmonary standpoint in May 2015  In case you've COPD flareup while in [place] in May 2015  - Take doxycycline 100mg  po twice daily x 5 days; take after meals and avoid sunlight  - Take medrol dose pak  -we are doing these scripts so save time  Followup  - 4-5 months or sooner if needed   Called spoke with patient who verified that MR gave her Doxycycline #10 and Medrol dose pak at last ov in case she developed symptoms on her trip to [place].  Pt stated that she actually began having symptoms on this past Friday 02/07/14 and began the above medications.  She has 3 days left of the Doxycycline but feels that her symptoms have worsened since onset - mostly dry cough, sore throat, head congestion with clear drainage, PND, increased SOB, wheezing, chest tightness with coughing.  Denies any f/c/s, hemoptysis, nausea, vomiting.  MR, pt is requesting another rx for the Doxycycline.  She [facility] on [facility]14 with therapeutic results and was recommended to try for the same antibiotic if possible as this did not effect her INR results.  Pt will be leaving for [place] on 02/17/14.  Dr [name] please advise, thank you.

## 2014-02-11 NOTE — Telephone Encounter (Signed)
Called spoke with patient - MR not in the office today and is booked tomorrow.  Pt does not want to see any other provider in the office.  Discussed with Anderson Malta - okay to double-book MR tomorrow 5.6.15 @ 2pm.  Called spoke with patient, she is okay with this date and time; will call for sooner follow up if needed for worsening symptoms.  Nothing further needed; will sign off.

## 2014-02-11 NOTE — Telephone Encounter (Signed)
Given fact no improvement   -a) have her come in for acute visit  B) please check with her if Dr C ([place]) arranged for echo or not; he told me he would. If not, please let me know so I can call him 02/11/2014  Dr. Brand Males, [name], [facility].C.P Pulmonary and Critical Care Medicine Staff Physician [facility] Pager: [phone], If no answer or between  15:00h - 7:00h: call [phone]  02/11/2014 9:43 AM

## 2014-02-12 ENCOUNTER — Encounter: Payer: Self-pay | Admitting: Internal Medicine

## 2014-02-12 ENCOUNTER — Ambulatory Visit (INDEPENDENT_AMBULATORY_CARE_PROVIDER_SITE_OTHER): Payer: Medicare Other | Admitting: Internal Medicine

## 2014-02-12 ENCOUNTER — Telehealth: Payer: Self-pay | Admitting: Internal Medicine

## 2014-02-12 ENCOUNTER — Encounter (HOSPITAL_COMMUNITY): Payer: Medicare Other

## 2014-02-12 VITALS — BP 126/74 | HR 80 | Temp 98.0°F | Ht 68.0 in | Wt 209.0 lb

## 2014-02-12 DIAGNOSIS — B370 Candidal stomatitis: Secondary | ICD-10-CM | POA: Insufficient documentation

## 2014-02-12 DIAGNOSIS — J441 Chronic obstructive pulmonary disease with (acute) exacerbation: Secondary | ICD-10-CM

## 2014-02-12 MED ORDER — CEFDINIR 300 MG PO CAPS: 300.0000 mg | ORAL_CAPSULE | Freq: Two times a day (BID) | ORAL | Status: DC

## 2014-02-12 MED ORDER — METHYLPREDNISOLONE 4 MG PO KIT: PACK | ORAL | Status: DC

## 2014-02-12 MED ORDER — NYSTATIN 100000 UNIT/ML MT SUSP: 5.0000 mL | Freq: Four times a day (QID) | OROMUCOSAL | Status: DC

## 2014-02-12 NOTE — Assessment & Plan Note (Signed)
 #  COPD Flare up  - you stil have wheezing - do medrol dose pak but start from day 2  - take omnicef 300mg  twice daily x 5 days   - call your coumadin clinic and make sure is ok to take omnicef and check INR before travel - I will write to Dr C and make sure he calls you with echo report  #Followup

## 2014-02-12 NOTE — Telephone Encounter (Signed)
Spoke with patient, will have her come in Friday afternoon for INR check, appt set.

## 2014-02-12 NOTE — Telephone Encounter (Signed)
[name], Can you please coordinate INR monitoring with [name]? MCr

## 2014-02-12 NOTE — Telephone Encounter (Signed)
Her echo actually looks pretty good. The cardiologist who read it did not realize that she had aortic and mitral valve replacement , but the gradients across both valves looked very much acceptable. EF is normal. Called her today with the results

## 2014-02-12 NOTE — Patient Instructions (Addendum)
#  thrush # Oral thrush - For Oral thrush: Take Suspension (swish and swallow): 500,000 units 4 times/day for 5 days; swish in the mouth and retain for as long as possible (several minutes) before swallowing - make sure you rinse your mouth after BREO - if recurs, persists might need to consider systemic diflucan or another MDI  #COPD Flare up  - you stil have wheezing - do medrol dose pak but start from day 2  - take omnicef 300mg  twice daily x 5 days   - call your coumadin clinic and make sure is ok to take omnicef and check INR before travel - I will write to Dr C and make sure he calls you with echo report  #Followup  3 months or sooner if needed

## 2014-02-12 NOTE — Assessment & Plan Note (Signed)
#  thrush # Oral thrush - For Oral thrush: Take Suspension (swish and swallow): 500,000 units 4 times/day for 5 days; swish in the mouth and retain for as long as possible (several minutes) before swallowing - make sure you rinse your mouth after BREO - if recurs, persists might need to consider systemic diflucan or another MDI

## 2014-02-12 NOTE — Telephone Encounter (Signed)
[name]  She is [name] [name] is leaving for [facility] in 4 days. Could you kindly call her with echo report. Does the current findings have anything to do with her resp flare ups?  Also, I placed her on omnicef. Could you please forward to  Your coumadin nurse to check her 5/8.15 Friday bfore her trip on 02/17/14 Monday  Thanks  Dr. Brand Males, [name], [facility].C.P Pulmonary and Critical Care Medicine Staff Physician [place] [facility] Pager: [phone], If no answer or between  15:00h - 7:00h: call [phone]  02/12/2014 2:15 PM

## 2014-02-12 NOTE — Assessment & Plan Note (Signed)
#  COPD Flare up  - you stil have wheezing - do medrol dose pak but start from day 2  - take omnicef 300mg  twice daily x 5 days   - call your coumadin clinic and make sure is ok to take omnicef and check INR before travel - I will write to Dr C and make sure he calls you with echo report  #Followup  3 months or sooner if needed

## 2014-02-12 NOTE — Progress Notes (Signed)
Subjective:    Patient ID: [name], female    DOB: 06/23/1946, 68 y.o.   MRN: [id number]  HPI   #Obese  Body mass index is 34.85 kg/(m^2). Body mass index is 35.98 kg/(m^2). - 05/03/2013 Body mass index is 35.25 kg/(m^2). on 06/25/2013      #Ex 15 pack smoker. Quit 1970s  #COPD/ASthma - Baseline CAT score 18 in May 2013 (poor score is 4 sleep quality, level 3 for dyspnea/fatigue)  #Pulmonary nodules  -  Colon Cancer during cancer Rx had CT chest Oct 2012 and pulmonary nodules noted  - October 2012: There is an irregular nodule in the right upper lobe measuring 12 mm which is unchanged from prior and has adjacent bronchiectasis also unchanged. Within the left lower lobe, there is a 4 mm pulmonary nodule (image 30) which is unchanged.  - October 2013: No change on CT chest Oct 2013   #colon Cancer  - 2012 (early) - s/p surgery and chemo  #Arctic valve stenosis  0 Dr. Rollene Fare  #Exacerbations of obstructive lung disease   - November 2013: Treatment from office with Levaquin and Medrol Dosepak  - March 2-14: Prednisone opd for flare up     OV .01/01/2013  Follows up for COPD/asthma.  - She is reporting chronic worsening of dyspnea for the last several months but the worsening is mild overall that she feels stable. In fact on COPD cat score is only 9 and is much better than baseline of 18 and also much improved compared to 38 at last visit in November 2013 when she was in an exacerbation. She says that her aortic  valve stenosis is stable and is being monitored by [facility] cardiology Dr. Rollene Fare. She feels deconditioned and is wondering if she needs to be attend pulmonary rehabilitation maintenance program. She denies any overt wheezing or worsening cough or sputum but on exam she was actively wheezing this visit   REC  #COPD  - You have mild wheezing even though you feel well  Take prednisone 40 mg daily x 2 days, then 20mg  daily x 2 days, then 10mg  daily  x 2 days, then 5mg  daily x 2 days and stop  I will re\re refer you to pulmonary rehabilitation, you can start that for maintenance program  Start N. acetylcysteine 600 mg twice a day  - [place] study published in [place] in 2014 shows it helps  - this is not to make you feel better but to to prevent recurrent bronchitis episodes  - You are more likely to get this drug at [facility] or a vitamin store then at [employer] or [place] or [employer]  - You can also get this at website [employer]  - It functions as an anti-oxidant and there are minimal/none side effects  #Followup  4-5 months with COPD cat score at followup   OV 05/03/2013  FU for copd/asthma  - Has been told that AS is severe and needs AVR. SO she has joined rehab. Has seen dr Cyndia Bent. COPD itrself is stable. No AECOPD. Continues with mdi. Feels good. Last time she was asymtoatic and we gave pred becuaes of wheeeon exam. THis timme too though baseline has occ wheeze despite spiriva and advair.  COPD CAT score is 19 but says this is stable.   HEr obesity continues to be a problem; says she is trying not to eat and joining rehab but unable to lose weight. I counseled her about diet and she rolled her eyes  when I suggested list of foods to eat and list of foods to avoid. She acknowledged that it would be difficult for her to make a change. She thinks that weight loss involves eating less. She does not realize that weight loss involves eating different which he admits is a harder challenge      Past, Family, Social reviewed: Has seen Dr Cyndia Bent for aortic stenosis and has been recommended valve replacement   #COPD  - some wheezing depsite subjective baseline state - continue spiriva  - chagne ADVAIR to BREO 1 puff daily - flu shot in fall  - continue rehab  #Weight mgmt #WEight Management    - we discussed extensively about weight management   - follow low glycemic diet plan that I outlined for you after extensive  discussion. Do not follow other plans  #ROV 2 months    OV 06/25/2013 Followup COPD and weight management of obesity  - Terms of weight management: She has not lost any weight. She flatly told me that she's not interested in the low glycemic diet because it is too difficult.  - Terms of COPD: She's not taking Spiriva and Brio. After change to Brio the COPD cat score is improved and is now 12.5. She feels a COPD status is stable. She is compliant with her medications. Especially Spiriva and Brio. She has not had a flu shot but will have it at her work at [employer] hospital. She wants Pneumovax administered today.  In terms of her attic stenosis: She does not know her surgery date and a followup with her cardiologist is pending    10/22/2013 [facility] follow up  Patient returns for a post hospital followup. Patient was admitted January 1 through January 6 for pneumococcal pneumonia, and COPD, exacerbation. Her condition is complicated by severe aortic valve stenosis, and acute on chronic congestive heart failure. CXR showed RLL infiltrate.  Her urine strep was positive. Ultimately her respiratory status improved aggressive IV antibiotics, nebulized bronchodilators, IV steroids, and diuresis. Patient's troponin was elevated. She was seen by cardiology and has an outpatient catheter planned She was discharged on Levaquin and a Medrol Dosepak. She has finished both of these prescriptions. 2-D echo showed an ejection fracture of 65%, severe aortic stenosis, severe mitral valve calcification and mildly dilated. Right ventricle. Since discharge, she is feeling some better but still weak.  CXR today shows some improvement in RLL infiltrate.  Weight is down 16 pounds. Patient says that she has no lower showed a swelling. She denies any hemoptysis, chest pain, orthopnea, PND, or fever Appetite is slowly returning. She has no nausea, vomiting, diarrhea.    OV 11/19/2013 Chief Complaint  Patient  presents with  . COPD    Pt c/o voice loss that started today. Pt denies any cough, congestion, chest pain, tightness.     Followup COPD in the setting of obesity and severe aortic stenosis  She continues to get better overall but still feeling weak and deconditioned. In fact COPD cat score is higher than baseline at 20. She does not have any COPD exacerbation symptoms although she does have some hoarseness of voice that started today. She feels that this might be a precursor to impending COPD exacerbation but she actively denies worsening cough or sputum production of fever or chills. Chest x-ray today shows mild residual right lower lobe infiltrate and a right small effusion  In terms of her aortic stenosis: She's having a cardiac cath at the end of this month  every 2015. This is been postponed to this date because of her pneumococcal pneumonia. She is thinking about postponing her aortic valve surgery to May 2015. I've told her that she should consider getting this done as early as possible instead of trying to postpone issue   Dg Chest 2 View  11/19/2013   CLINICAL DATA:  Followup pneumonia, shortness of breath, history COPD, hypertension, asthma  EXAM: CHEST  2 VIEW  COMPARISON:  10/22/2013  FINDINGS: Enlargement of cardiac silhouette.  Mediastinal contours and pulmonary vascularity normal.  Minimal right apical pleural thickening stable.  Emphysematous and bronchitic changes consistent with COPD.  Mild residual right basilar infiltrate.  Minimal scarring at left base.  Remaining lungs clear.  No pneumothorax.  Question minimal right pleural effusion.  Mitral annular calcification again noted.  IMPRESSION: COPD changes with mild residual infiltrate at right base and question minimal right pleural effusion.   Electronically Signed   By: Lavonia Dana [name]   On: 11/19/2013 16:07     OV 01/29/2014  Chief Complaint  Patient presents with  . COPD    follow-up. Pt states she is having more SOB  since discharge. Pt is also having some hoarseness today.      Followup obstructive lung disease: COPD/asthma  Since I last saw her she had aortic  valve and mitral valve surgery. She has done well. Apparently the surgery was long and complicated due to extensive calcification of the aortic valve. She was anemic postoperatively but since then her hemoglobin is returned 11 g percent on 01/08/2014. She is also followed with cardiology Dr. Loletha Grayer. on 01/08/2014 and it appears based on his notes that she is doing well. Cardiac rehabilitation postoperative is still pending. Her main issue is that she is fatigued and shortness of breath with exertion but apparently she does not desaturate but gets easily tachycardic. She is worried about this but denies any active cough or wheezing. COPD cat score is 14 and a slightly better than baseline and it appears the most significant component of this is dyspnea and fatigue with cough being minimal as before. Most recent chest x-ray the first 2015 is clear except for minimal right pleural effusion.  Walking desaturation test her and made her feet x3 laps on room air: Resting pulse ox was 96% he did resting heart rate was 90 per minute. She was only able to walk 2 laps out of the 3 and stop due to dyspnea and fatigue. Resting pulse ox was stable at 97%. However heart rate jumped up 120 per minute. At one point the machine even picked up a heart rate of 150 per minute but this is very transient  Past and social history reviewed: This is as above but also the fact she is going to [place] in May [place] in [place] and she wants COPD exacerbation antibiotics   OV 02/12/2014  Chief Complaint  Patient presents with  . COPD    acute visit.  took Doxycycline and medrol dosepak rx'd at last ov d/t hoarseness, upper airway wheezing, dry cough, chest congestion.  denies f/c/s, dyspnea, hemoptysis, nausea, vomiting     Followup obstructive lung disease: COPD/asthma. Status  post aortic and mitral valve surgery with postoperative dyspnea/fatigue with workup in progress   The main issue at last visit was fatigued. Her cardiologist Dr. Loletha Grayer. has performed an echo a few days ago the results of which are reviewed below but she is unaware of the results. I do not know  how to interpret these results in the context of postoperative findings and her symptoms so I will defer that to her cardiologist. However, she feels that her fatigue/dyspnea slightly better and this is associated with ongoing cardiopulmonary rehabilitation.  The main issue now is that several days ago 2 weeks ago she fell ill with sore throat and increased rhinorrhea and sinus drainage. She self treated herself because of associated chest tightness and wheezing with Medrol and doxycycline. She's completes one week of doxycycline tomorrow or the day after but she's not better. She finished her Medrol today. She fell she initially improved but then got worse. She is persistent sore throat and upper chest tightness with wheezing and cough. She is due to travel or 02/17/2014 to [place] and therefore is here acutely to make sure it is safe for her to travel   Study Conclusions ECHO 02/10/14:   - Left ventricle: There is a mid cavity dynamic obstruction with a peak gradient at rest of 80mmHg which increases to 39mmHg with Valsalva The cavity size was normal. There was moderate focal basal and mild concentric hypertrophy. Systolic function was normal. The estimated ejection fraction was in the range of 60% to 65%. Wall motion was normal; there were no regional wall motion abnormalities. - Aortic valve: The AV is poorly visualized and the AV leaflets are not adequately seen to determine mobility. The AVA calculates the AS at moderate but by peak velocity and mean gradient it is in the mild range Poorly visualized. Severe thickening and calcification. There was mild stenosis. Valve area: 1.06cm^2(VTI). Valve  area: 0.96cm^2 (Vmax).  - Mitral valve: Severely calcified annulus. Moderate diffuse thickening and calcification, with moderate involvement of chords. Mobility was restricted. The findings are consistent with moderate stenosis. Valve area by pressure half-time: 2.24cm^2. Valve area by continuity equation (using LVOT flow): 1.05cm^2.  - Right ventricle: The cavity size was mildly dilated. Wall thickness was normal. Transthoracic echocardiography. M-mode, complete 2D, spectral Doppler, and color Doppler. Height: Height: 172.7cm. Height: 68in. Weight: Weight: 96.2kg. Weight: 211.6lb. Body mass index: BMI: 32.2kg/m^2. Body surface area: BSA: 2.27m^2. Blood pressure: 130/70. Patient status: Outpatient. Location: Echo laboratory.    CAT COPD Symptom & Quality of Life Score (GSK trademark) 0 is no burden. 5 is highest burden 09/04/2012 Exacerbation  01/01/2013  05/03/2013  06/25/2013 With breo 11/19/2013  01/29/2014   Never Cough -> Cough all the time 4 2 2  1.5 1 1   No phlegm in chest -> Chest is full of phlegm 4 0.5 1 0.5 1 1   No chest tightness -> Chest feels very tight 5 0.5 3 1.5 2 0  No dyspnea for 1 flight stairs/hill -> Very dyspneic for 1 flight of stairs 5 3.5 4 4 5 5   No limitations for ADL at home -> Very limited with ADL at home 5 1.5 2 2 4 3   Confident leaving home -> Not at all confident leaving home 5 0 1 0 1 0  Sleep soundly -> Do not sleep soundly because of lung condition 5 0 4 0 4 0  Lots of Energy -> No energy at all 5 1 2.5 3 2.5 4  TOTAL Score (max 40)  38 9 19.5 12.5 20.5 14       Review of Systems  Constitutional: Negative.   HENT: Positive for congestion, mouth sores, postnasal drip, rhinorrhea, sore throat and voice change.   Eyes: Negative.   Respiratory: Positive for cough and chest tightness.   Cardiovascular: Negative.  Gastrointestinal: Negative.   Endocrine: Negative.   Genitourinary: Negative.   Musculoskeletal: Negative.   Skin: Negative.    Allergic/Immunologic: Negative.   Neurological: Negative.   Hematological: Negative.   Psychiatric/Behavioral: Negative.        Objective:   Physical Exam  Vitals reviewed. Constitutional: She is oriented to person, place, and time. She appears well-developed and well-nourished. No distress.  Body mass index is 31.79 kg/(m^2). Looks better and more conditioned than last visit   HENT:  Head: Normocephalic and atraumatic.  Right Ear: External ear normal.  Left Ear: External ear normal.  Mouth/Throat: Oropharynx is clear and moist. No oropharyngeal exudate.  Oral thrush +  Eyes: Conjunctivae and EOM are normal. Pupils are equal, round, and reactive to light. Right eye exhibits no discharge. Left eye exhibits no discharge. No scleral icterus.  Neck: Normal range of motion. Neck supple. No JVD present. No tracheal deviation present. No thyromegaly present.  Cardiovascular: Normal rate, regular rhythm, normal heart sounds and intact distal pulses.  Exam reveals no gallop and no friction rub.   No murmur heard. Pulmonary/Chest: Effort normal. No respiratory distress. She has wheezes. She has no rales. She exhibits no tenderness.  Bilateral wheeze; R > L  Abdominal: Soft. Bowel sounds are normal. She exhibits no distension and no mass. There is no tenderness. There is no rebound and no guarding.  Musculoskeletal: Normal range of motion. She exhibits no edema and no tenderness.  Lymphadenopathy:    She has no cervical adenopathy.  Neurological: She is alert and oriented to person, place, and time. She has normal reflexes. No cranial nerve deficit. She exhibits normal muscle tone. Coordination normal.  Skin: Skin is warm and dry. No rash noted. She is not diaphoretic. No erythema. No pallor.  Psychiatric: She has a normal mood and affect. Her behavior is normal. Judgment and thought content normal.    Filed Vitals:   02/12/14 1358  BP: 126/74  Pulse: 80  Temp: 98 F (36.7 C)   TempSrc: Oral  Height: 5\' 8"  (1.727 m)  Weight: 209 lb (94.802 kg)  SpO2: 96%          Assessment & Plan:

## 2014-02-14 ENCOUNTER — Encounter (HOSPITAL_COMMUNITY): Payer: Medicare Other

## 2014-02-14 ENCOUNTER — Ambulatory Visit (INDEPENDENT_AMBULATORY_CARE_PROVIDER_SITE_OTHER): Payer: Medicare Other | Admitting: Pharmacist Clinician (PhC)/ Clinical Pharmacy Specialist

## 2014-02-14 DIAGNOSIS — Z952 Presence of prosthetic heart valve: Secondary | ICD-10-CM

## 2014-02-14 DIAGNOSIS — Z954 Presence of other heart-valve replacement: Secondary | ICD-10-CM

## 2014-02-14 DIAGNOSIS — Z7901 Long term (current) use of anticoagulants: Secondary | ICD-10-CM

## 2014-02-14 LAB — POCT INR: INR: 3

## 2014-02-14 NOTE — Telephone Encounter (Signed)
Encounter Closed---5/8 TP 

## 2014-02-16 ENCOUNTER — Encounter: Payer: Self-pay | Admitting: Family Medicine

## 2014-02-19 ENCOUNTER — Encounter (HOSPITAL_COMMUNITY): Payer: Medicare Other

## 2014-02-21 ENCOUNTER — Encounter (HOSPITAL_COMMUNITY): Payer: Medicare Other

## 2014-02-26 ENCOUNTER — Encounter (HOSPITAL_COMMUNITY)
Admission: RE | Admit: 2014-02-26 | Discharge: 2014-02-26 | Disposition: A | Discharge status: Home / Self Care | Payer: Medicare Other | Source: Ambulatory Visit | Attending: Cardiovascular Disease | Admitting: Cardiovascular Disease

## 2014-02-26 NOTE — Progress Notes (Signed)
Reviewed home exercise with pt today.  Pt plans to walk and use recumbent bike at home for exercise.  Reminded pt to maintain SaO2 >90%.  Reviewed THR, pulse, RPE, sign and symptoms, and when to call 911 or MD.  Pt voiced understanding. Alberteen Sam, MA, ACSM RCEP

## 2014-02-27 NOTE — Progress Notes (Signed)
Yazmyne returned to exercise yesterday. Vital signs stable.Will continue to monitor the patient throughout  the program.

## 2014-02-28 ENCOUNTER — Encounter (HOSPITAL_COMMUNITY)
Admission: RE | Admit: 2014-02-28 | Discharge: 2014-02-28 | Disposition: A | Discharge status: Home / Self Care | Payer: Medicare Other | Source: Ambulatory Visit | Attending: Cardiovascular Disease | Admitting: Cardiovascular Disease

## 2014-03-04 ENCOUNTER — Other Ambulatory Visit: Payer: Self-pay | Admitting: Family Medicine

## 2014-03-04 NOTE — Telephone Encounter (Signed)
See previous refill. Patient has an appointment scheduled with you 06/13/14. Is it okay to refill medication?

## 2014-03-05 ENCOUNTER — Telehealth: Payer: Self-pay | Admitting: Cardiovascular Disease

## 2014-03-05 ENCOUNTER — Encounter (HOSPITAL_COMMUNITY)
Admission: RE | Admit: 2014-03-05 | Discharge: 2014-03-05 | Disposition: A | Discharge status: Home / Self Care | Payer: Medicare Other | Source: Ambulatory Visit | Attending: Cardiovascular Disease | Admitting: Cardiovascular Disease

## 2014-03-05 MED ORDER — LOVASTATIN 40 MG PO TABS: 40.0000 mg | ORAL_TABLET | Freq: Every day | ORAL | Status: DC

## 2014-03-05 NOTE — Telephone Encounter (Signed)
Lovastatin refilled electronically.

## 2014-03-05 NOTE — Telephone Encounter (Signed)
Her primary doctor have retired and she need a prescription for her Mevacor. Will Dr C call this in for her?

## 2014-03-05 NOTE — Telephone Encounter (Signed)
Lovastatin refilled w/2 refills electronically.

## 2014-03-05 NOTE — Progress Notes (Signed)
[name] 68 y.o. female Nutrition Note Spoke with pt. Pt well-known to this writer from previous admission in Pulmonary Rehab. Nutrition Survey reviewed with pt. Pt is following Step 2 of the Therapeutic Lifestyle Changes diet. Pt wants to lose wt. Pt has been trying to lose wt by decreasing portion sizes eaten. Pt wt is down 24.8 lb over the past 6 months. Pt wt today reportedly 215.2 lb (97.8 kg). Wt loss tips reviewed. Barriers to further wt loss include pt does not like many non-starchy vegetables and pt eating only 2 servings of fruit daily. Pt expressed understanding of the information reviewed. Pt aware of nutrition education classes offered and is unable to attend nutrition classes. Vitals - 1 value per visit 02/12/2014 01/29/2014 01/23/2014 01/10/2014 01/08/2014  Weight (lb) 209 212.8 214.07 212.2 212   Vitals - 1 value per visit 01/08/2014 12/24/2013 12/12/2013 12/06/2013 12/02/2013  Weight (lb) 212 222.66  214.2 221   Vitals - 1 value per visit 11/28/2013 11/27/2013 11/25/2013 11/19/2013  Weight (lb)  234.5  222.6   Vitals - 1 value per visit 10/22/2013 10/15/2013 10/12/2013 10/10/2013 10/01/2013  Weight (lb) 216.4 232   227.3   Vitals - 1 value per visit 09/04/2013 08/31/2013 08/29/2013 07/26/2013  Weight (lb) 240  241.2 240.4   Vitals - 1 value per visit 06/25/2013 06/20/2013 05/03/2013 04/23/2013  Weight (lb) 231.8 235.7 236.6 240.4   Vitals - 1 value per visit 03/19/2013 03/13/2013 03/05/2013  Weight (lb)  238 241.6   Nutrition Diagnosis   Food-and nutrition-related knowledge deficit related to lack of exposure to information as related to diagnosis of: ? CVD    Obesity related to excessive energy intake as evidenced by a BMI of 34.3  Nutrition Intervention   Benefits of adopting Therapeutic Lifestyle Changes discussed when Medficts reviewed.   Pt to attend the Portion Distortion class   Pt given handouts for: ? Nutrition I class ? Nutrition II class   Continue client-centered nutrition  education by RD, as part of interdisciplinary care.  Goal(s)   Pt to eat more fruit.   Pt to pick healthier lunch foods. (e.g. Peanut butter sandwich on 100% whole grain bread instead of peanut butter and Ritz crackers)   Pt to eat more beans and peas.(Pt currently eats white rice and potatoes frequently)   Pt to identify food quantities necessary to achieve: ? wt loss to a goal wt of 190-208 lb (86.2-94.4 kg) at graduation from cardiac rehab.   Monitor and Evaluate progress toward nutrition goal with team. Nutrition Risk:  Low   Derek Mound, M.Ed, RD, LDN, CDE 03/05/2014 2:46 PM

## 2014-03-07 ENCOUNTER — Encounter (HOSPITAL_COMMUNITY)
Admission: RE | Admit: 2014-03-07 | Discharge: 2014-03-07 | Disposition: A | Discharge status: Home / Self Care | Payer: Medicare Other | Source: Ambulatory Visit | Attending: Cardiovascular Disease | Admitting: Cardiovascular Disease

## 2014-03-07 ENCOUNTER — Ambulatory Visit: Payer: Medicare Other | Admitting: Pharmacist Clinician (PhC)/ Clinical Pharmacy Specialist

## 2014-03-12 ENCOUNTER — Encounter (HOSPITAL_COMMUNITY)
Admission: RE | Admit: 2014-03-12 | Discharge: 2014-03-12 | Disposition: A | Discharge status: Home / Self Care | Payer: Medicare Other | Source: Ambulatory Visit | Attending: Internal Medicine | Admitting: Internal Medicine

## 2014-03-12 ENCOUNTER — Ambulatory Visit (INDEPENDENT_AMBULATORY_CARE_PROVIDER_SITE_OTHER): Payer: Medicare Other | Admitting: Pharmacist Clinician (PhC)/ Clinical Pharmacy Specialist

## 2014-03-12 DIAGNOSIS — Z952 Presence of prosthetic heart valve: Secondary | ICD-10-CM

## 2014-03-12 DIAGNOSIS — Z7901 Long term (current) use of anticoagulants: Secondary | ICD-10-CM

## 2014-03-12 DIAGNOSIS — I080 Rheumatic disorders of both mitral and aortic valves: Secondary | ICD-10-CM | POA: Insufficient documentation

## 2014-03-12 DIAGNOSIS — Z954 Presence of other heart-valve replacement: Secondary | ICD-10-CM

## 2014-03-12 DIAGNOSIS — I369 Nonrheumatic tricuspid valve disorder, unspecified: Secondary | ICD-10-CM | POA: Insufficient documentation

## 2014-03-12 LAB — POCT INR: INR: 1.3

## 2014-03-13 ENCOUNTER — Telehealth: Payer: Self-pay | Admitting: Pharmacist Clinician (PhC)/ Clinical Pharmacy Specialist

## 2014-03-13 NOTE — Telephone Encounter (Signed)
Closed encounter °

## 2014-03-14 ENCOUNTER — Other Ambulatory Visit: Payer: Self-pay | Admitting: Internal Medicine

## 2014-03-14 ENCOUNTER — Encounter (HOSPITAL_COMMUNITY)
Admission: RE | Admit: 2014-03-14 | Discharge: 2014-03-14 | Disposition: A | Discharge status: Home / Self Care | Payer: Medicare Other | Source: Ambulatory Visit | Attending: Cardiovascular Disease | Admitting: Cardiovascular Disease

## 2014-03-17 IMAGING — CR DG CHEST 2V
2 series · 2 of 2 positions shown · non-contrast
Comparison: 10/22/2013

CLINICAL DATA: Followup pneumonia, shortness of breath, history
COPD, hypertension, asthma

EXAM:
CHEST  2 VIEW

[view not recorded (1 of 2)]
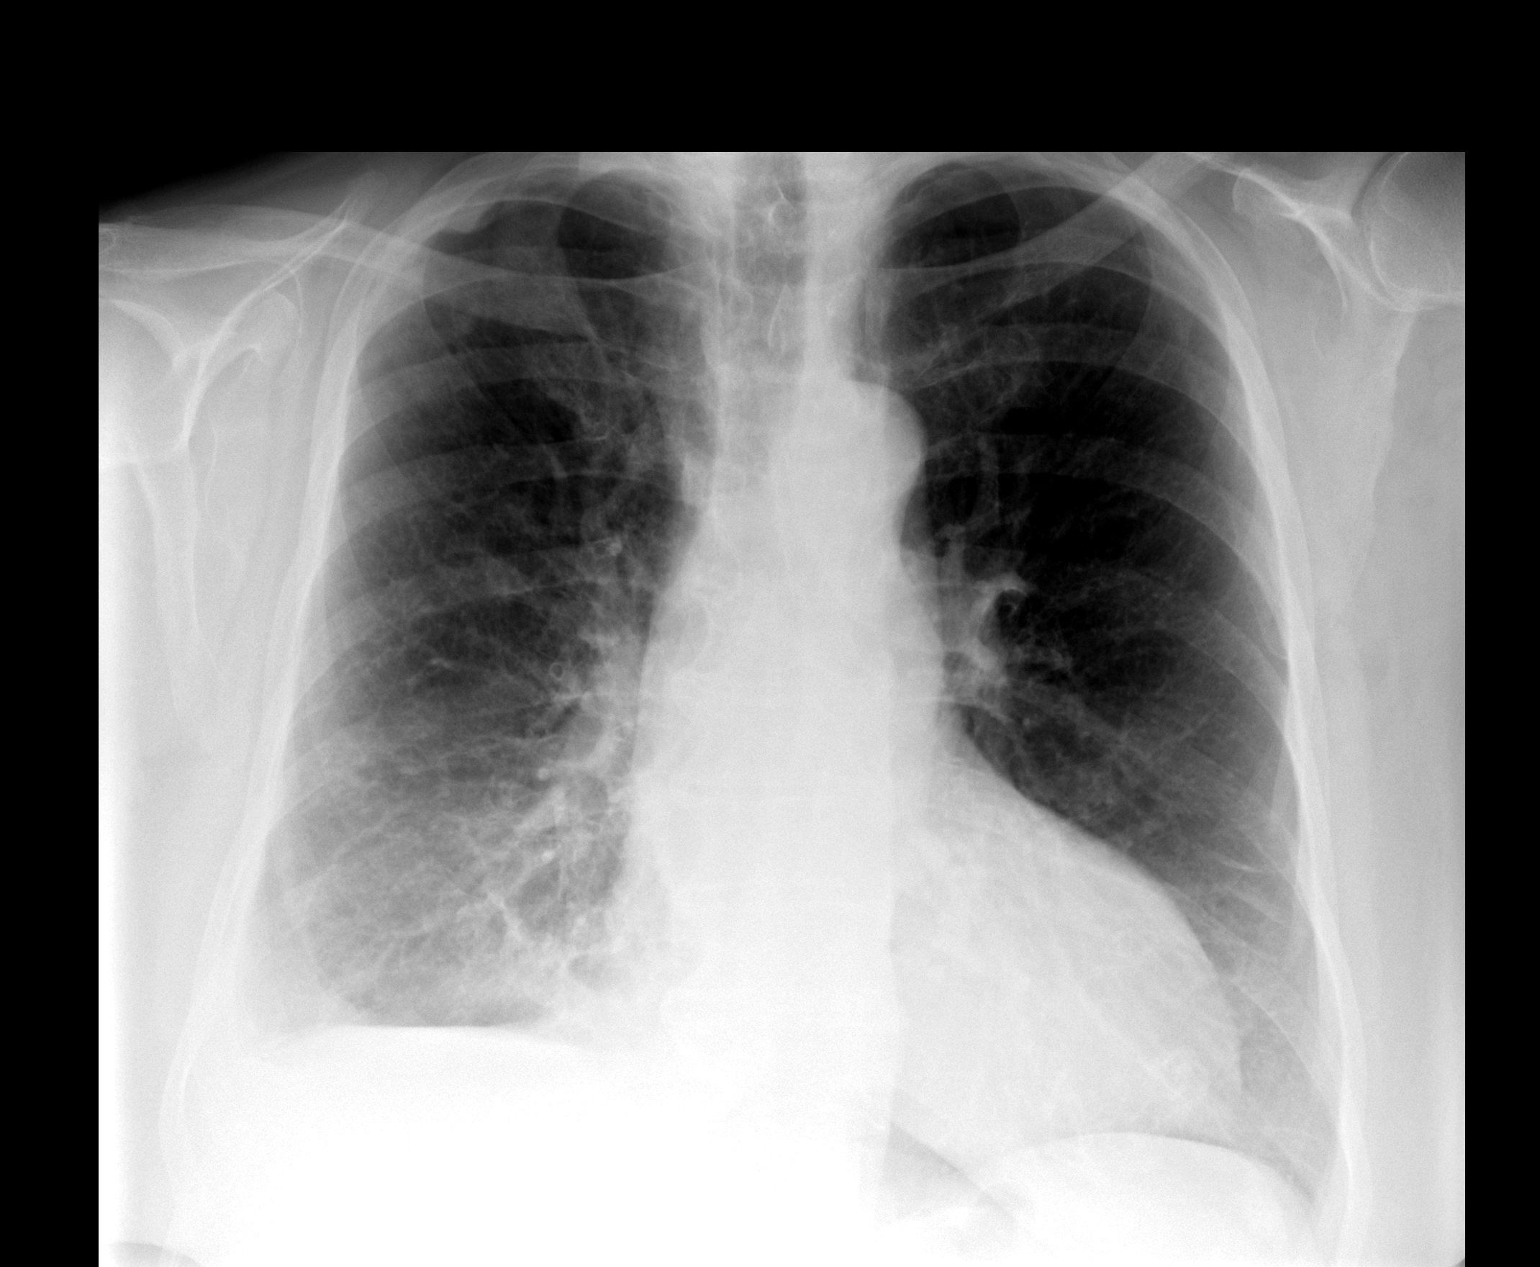

[view not recorded (2 of 2)]
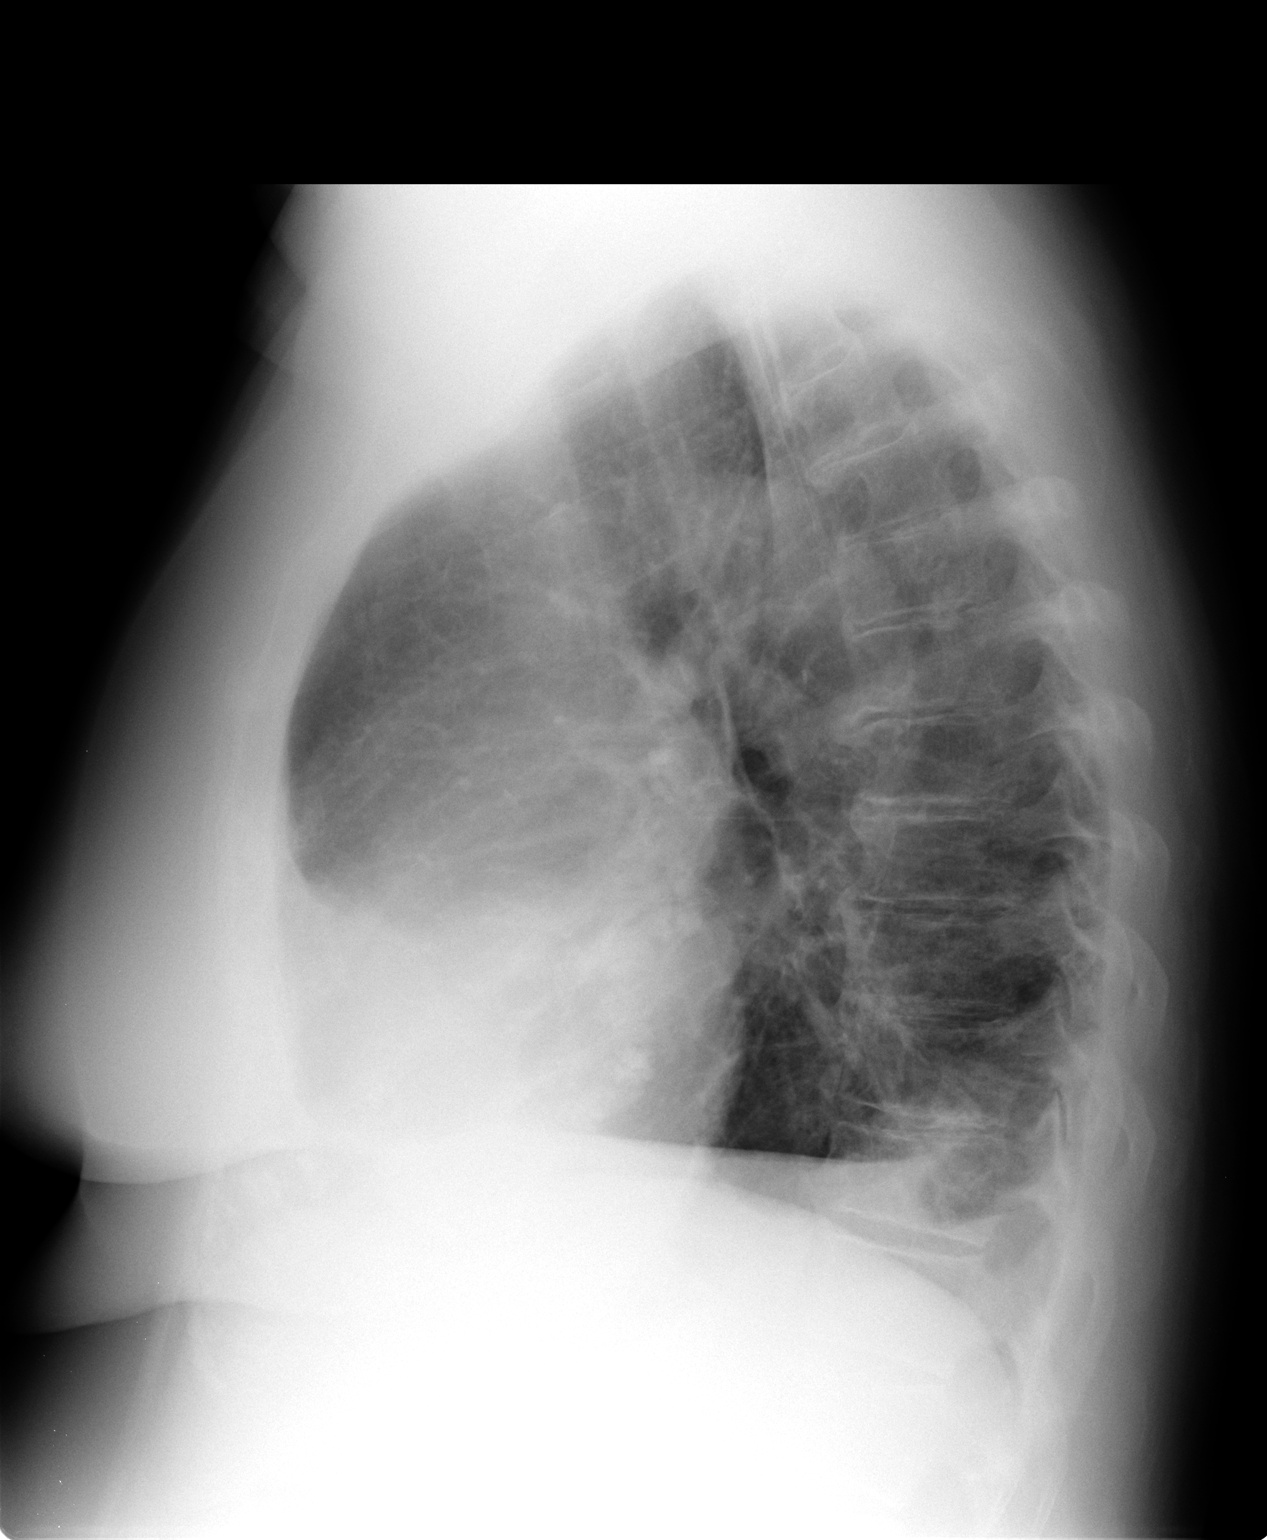

[2 of 2 positions shown; findings below may reference images not displayed]

FINDINGS: Enlargement of cardiac silhouette.

Mediastinal contours and pulmonary vascularity normal.

Minimal right apical pleural thickening stable.

Emphysematous and bronchitic changes consistent with COPD.

Mild residual right basilar infiltrate.

Minimal scarring at left base.

Remaining lungs clear.

No pneumothorax.

Question minimal right pleural effusion.

Mitral annular calcification again noted.
IMPRESSION: COPD changes with mild residual infiltrate at right base and
question minimal right pleural effusion.

## 2014-03-19 ENCOUNTER — Encounter (HOSPITAL_COMMUNITY): Payer: Medicare Other

## 2014-03-21 ENCOUNTER — Encounter (HOSPITAL_COMMUNITY)
Admission: RE | Admit: 2014-03-21 | Discharge: 2014-03-21 | Disposition: A | Discharge status: Home / Self Care | Payer: Medicare Other | Source: Ambulatory Visit | Attending: Cardiovascular Disease | Admitting: Cardiovascular Disease

## 2014-03-24 ENCOUNTER — Ambulatory Visit (INDEPENDENT_AMBULATORY_CARE_PROVIDER_SITE_OTHER): Payer: Medicare Other | Admitting: Pharmacist Clinician (PhC)/ Clinical Pharmacy Specialist

## 2014-03-24 DIAGNOSIS — Z952 Presence of prosthetic heart valve: Secondary | ICD-10-CM

## 2014-03-24 DIAGNOSIS — Z7901 Long term (current) use of anticoagulants: Secondary | ICD-10-CM

## 2014-03-24 DIAGNOSIS — Z954 Presence of other heart-valve replacement: Secondary | ICD-10-CM

## 2014-03-24 LAB — POCT INR: INR: 1.5

## 2014-03-26 ENCOUNTER — Encounter (HOSPITAL_COMMUNITY)
Admission: RE | Admit: 2014-03-26 | Discharge: 2014-03-26 | Disposition: A | Discharge status: Home / Self Care | Payer: Medicare Other | Source: Ambulatory Visit | Attending: Cardiovascular Disease | Admitting: Cardiovascular Disease

## 2014-03-28 ENCOUNTER — Encounter (HOSPITAL_COMMUNITY)
Admission: RE | Admit: 2014-03-28 | Discharge: 2014-03-28 | Disposition: A | Discharge status: Home / Self Care | Payer: Medicare Other | Source: Ambulatory Visit | Attending: Cardiovascular Disease | Admitting: Cardiovascular Disease

## 2014-04-02 ENCOUNTER — Encounter (HOSPITAL_COMMUNITY)
Admission: RE | Admit: 2014-04-02 | Discharge: 2014-04-02 | Disposition: A | Discharge status: Home / Self Care | Payer: Medicare Other | Source: Ambulatory Visit | Attending: Cardiovascular Disease | Admitting: Cardiovascular Disease

## 2014-04-04 ENCOUNTER — Encounter (HOSPITAL_COMMUNITY): Payer: Medicare Other

## 2014-04-08 ENCOUNTER — Other Ambulatory Visit: Payer: Self-pay | Admitting: *Deleted

## 2014-04-08 MED ORDER — PANTOPRAZOLE SODIUM 40 MG PO TBEC: 40.0000 mg | DELAYED_RELEASE_TABLET | Freq: Every day | ORAL | Status: DC

## 2014-04-08 NOTE — Telephone Encounter (Signed)
Rx was sent to pharmacy electronically. 

## 2014-04-09 ENCOUNTER — Encounter (HOSPITAL_COMMUNITY)
Admission: RE | Admit: 2014-04-09 | Discharge: 2014-04-09 | Disposition: A | Discharge status: Home / Self Care | Payer: Medicare Other | Source: Ambulatory Visit | Attending: Internal Medicine | Admitting: Internal Medicine

## 2014-04-09 DIAGNOSIS — I369 Nonrheumatic tricuspid valve disorder, unspecified: Secondary | ICD-10-CM | POA: Diagnosis not present

## 2014-04-09 DIAGNOSIS — Z954 Presence of other heart-valve replacement: Secondary | ICD-10-CM | POA: Diagnosis present

## 2014-04-09 DIAGNOSIS — I080 Rheumatic disorders of both mitral and aortic valves: Secondary | ICD-10-CM | POA: Insufficient documentation

## 2014-04-16 ENCOUNTER — Encounter (HOSPITAL_COMMUNITY)
Admission: RE | Admit: 2014-04-16 | Discharge: 2014-04-16 | Disposition: A | Discharge status: Home / Self Care | Payer: Medicare Other | Source: Ambulatory Visit | Attending: Cardiovascular Disease | Admitting: Cardiovascular Disease

## 2014-04-16 DIAGNOSIS — I080 Rheumatic disorders of both mitral and aortic valves: Secondary | ICD-10-CM | POA: Diagnosis not present

## 2014-04-18 ENCOUNTER — Encounter (HOSPITAL_COMMUNITY): Payer: Medicare Other

## 2014-04-18 ENCOUNTER — Telehealth (HOSPITAL_COMMUNITY): Payer: Self-pay | Admitting: *Deleted

## 2014-04-22 ENCOUNTER — Ambulatory Visit: Payer: Medicare Other | Admitting: Cardiovascular Disease

## 2014-04-23 ENCOUNTER — Encounter (HOSPITAL_COMMUNITY): Admission: RE | Admit: 2014-04-23 | Payer: Medicare Other | Source: Ambulatory Visit

## 2014-04-25 ENCOUNTER — Encounter (HOSPITAL_COMMUNITY): Payer: Medicare Other

## 2014-04-30 ENCOUNTER — Encounter (HOSPITAL_COMMUNITY)
Admission: RE | Admit: 2014-04-30 | Discharge: 2014-04-30 | Disposition: A | Discharge status: Home / Self Care | Payer: Medicare Other | Source: Ambulatory Visit | Attending: Cardiovascular Disease | Admitting: Cardiovascular Disease

## 2014-04-30 ENCOUNTER — Other Ambulatory Visit: Payer: Self-pay | Admitting: Internal Medicine

## 2014-04-30 DIAGNOSIS — I080 Rheumatic disorders of both mitral and aortic valves: Secondary | ICD-10-CM | POA: Diagnosis not present

## 2014-05-02 ENCOUNTER — Encounter (HOSPITAL_COMMUNITY)
Admission: RE | Admit: 2014-05-02 | Discharge: 2014-05-02 | Disposition: A | Discharge status: Home / Self Care | Payer: Medicare Other | Source: Ambulatory Visit | Attending: Cardiovascular Disease | Admitting: Cardiovascular Disease

## 2014-05-02 DIAGNOSIS — I080 Rheumatic disorders of both mitral and aortic valves: Secondary | ICD-10-CM | POA: Diagnosis not present

## 2014-05-05 ENCOUNTER — Other Ambulatory Visit: Payer: Self-pay | Admitting: *Deleted

## 2014-05-05 ENCOUNTER — Telehealth: Payer: Self-pay | Admitting: Cardiovascular Disease

## 2014-05-05 MED ORDER — AMOXICILLIN 500 MG PO CAPS: ORAL_CAPSULE | ORAL | Status: DC

## 2014-05-05 NOTE — Telephone Encounter (Signed)
Patient states that she has a tooth that is bothering her and she needs to make a dental appointment.  She had heart surgery in March of this year and was told that she will need antibiotics prior to any dental procedure.  She spoke with her dentist and they state they would like for Dr. Sallyanne Kuster to prescribe the antibiotic for the first time.  Please advise the patient so she can schedule her dental appointment.

## 2014-05-05 NOTE — Telephone Encounter (Signed)
Amoxicillin 2 g , 30-60 minutes before dental procedures - can be a standing order for her please

## 2014-05-05 NOTE — Telephone Encounter (Signed)
Prescription for amoxil 500 mg sent to [facility] per V.O. From Dr. Sallyanne Kuster for dental prophylaxis.

## 2014-05-05 NOTE — Telephone Encounter (Signed)
Deferred to Dr. Sallyanne Kuster for review.

## 2014-05-07 ENCOUNTER — Encounter (HOSPITAL_COMMUNITY)
Admission: RE | Admit: 2014-05-07 | Discharge: 2014-05-07 | Disposition: A | Discharge status: Home / Self Care | Payer: Medicare Other | Source: Ambulatory Visit | Attending: Cardiovascular Disease | Admitting: Cardiovascular Disease

## 2014-05-07 DIAGNOSIS — I080 Rheumatic disorders of both mitral and aortic valves: Secondary | ICD-10-CM | POA: Diagnosis not present

## 2014-05-09 ENCOUNTER — Encounter (HOSPITAL_COMMUNITY)
Admission: RE | Admit: 2014-05-09 | Discharge: 2014-05-09 | Disposition: A | Discharge status: Home / Self Care | Payer: Medicare Other | Source: Ambulatory Visit | Attending: Cardiovascular Disease | Admitting: Cardiovascular Disease

## 2014-05-09 DIAGNOSIS — I080 Rheumatic disorders of both mitral and aortic valves: Secondary | ICD-10-CM | POA: Diagnosis not present

## 2014-05-12 ENCOUNTER — Encounter: Payer: Self-pay | Admitting: Cardiovascular Disease

## 2014-05-12 ENCOUNTER — Ambulatory Visit (INDEPENDENT_AMBULATORY_CARE_PROVIDER_SITE_OTHER): Payer: Medicare Other | Admitting: Cardiovascular Disease

## 2014-05-12 VITALS — BP 132/73 | HR 102 | Resp 20 | Ht 68.0 in | Wt 213.8 lb

## 2014-05-12 DIAGNOSIS — R0602 Shortness of breath: Secondary | ICD-10-CM

## 2014-05-12 DIAGNOSIS — R5381 Other malaise: Secondary | ICD-10-CM

## 2014-05-12 DIAGNOSIS — R002 Palpitations: Secondary | ICD-10-CM

## 2014-05-12 DIAGNOSIS — Z952 Presence of prosthetic heart valve: Secondary | ICD-10-CM

## 2014-05-12 DIAGNOSIS — Z79899 Other long term (current) drug therapy: Secondary | ICD-10-CM

## 2014-05-12 DIAGNOSIS — R5383 Other fatigue: Secondary | ICD-10-CM

## 2014-05-12 DIAGNOSIS — Z954 Presence of other heart-valve replacement: Secondary | ICD-10-CM

## 2014-05-12 LAB — CBC
HCT: 37.7 % (ref 36.0–46.0)
Hemoglobin: 12.2 g/dL (ref 12.0–15.0)
MCH: 27.6 pg (ref 26.0–34.0)
MCHC: 32.4 g/dL (ref 30.0–36.0)
MCV: 85.3 fL (ref 78.0–100.0)
Platelets: 210 10*3/uL (ref 150–400)
RBC: 4.42 MIL/uL (ref 3.87–5.11)
RDW: 16.5 % — ABNORMAL HIGH (ref 11.5–15.5)
WBC: 5.7 10*3/uL (ref 4.0–10.5)

## 2014-05-12 MED ORDER — FUROSEMIDE 40 MG PO TABS: 40.0000 mg | ORAL_TABLET | Freq: Every day | ORAL | Status: DC

## 2014-05-12 MED ORDER — POTASSIUM CHLORIDE CRYS ER 10 MEQ PO TBCR: 20.0000 meq | EXTENDED_RELEASE_TABLET | Freq: Every day | ORAL | Status: DC

## 2014-05-12 NOTE — Progress Notes (Signed)
Patient ID: [name], female   DOB: [birth year], 68 y.o.   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 Surgery was performed on March 5 and she was discharged from the hospital on March 13. The aortic valve prosthesis is a 21 mm [employer] MagnaEase pericardial tissue valve and the mitral valve is a 25 mm device of the same make. She has gradually recovered and has participated in cardiac rehabilitation and now pulmonary rehabilitation. She has occasional wheezing. She was treated by Dr. [name] with doxycycline and steroids in May. She continues to have dyspnea on exertion, New York Heart Association functional class II. She feels tired and "huffs and puffs" after walking 5 labs at rehabilitation. She is very tired walking up one flight of stairs. She is still slightly pale. She occasionally has ankle swelling that resolves by the morning time. She enjoyed [facility] [place] [facility] [place], which she helped organize for her husband.   Allergies  Allergen Reactions  . Codeine Nausea Only  . Prednisone Hives    In different places all over the body.  . Sulfonamide Derivatives Nausea Only    Current Outpatient Prescriptions  Medication Sig Dispense Refill  . albuterol (PROVENTIL HFA;VENTOLIN HFA) 108 (90 BASE) MCG/ACT inhaler Inhale 2 puffs into the lungs every 6 (six) hours as needed for wheezing.      Marland Kitchen amoxicillin (AMOXIL) 500 MG capsule Take 4 capsules 30-60 minutes prior to dental appointment  4 capsule  12  . aspirin 81 MG tablet Take 81 mg by mouth at bedtime.       Marland Kitchen BREO ELLIPTA 100-25 MCG/INH AEPB inhale 1 puff by mouth once daily  60 each  5  . Calcium Carbonate-Vitamin D (CALCIUM 600 + D PO) Take 1,200 mg  by mouth at bedtime.       Marland Kitchen diltiazem (CARDIZEM CD) 120 MG 24 hr capsule Take 1 capsule (120 mg total) by mouth daily.  30 capsule  6  . furosemide (LASIX) 40 MG tablet Take 1 tablet (40 mg total) by mouth daily.  30 tablet  11  . lovastatin (MEVACOR) 40 MG tablet take 1 tablet by mouth at bedtime  30 tablet  6  . Miconazole Nitrate 2 % POWD 1 application by Does not apply route daily. Apply to stomach and under breasts      . montelukast (SINGULAIR) 10 MG tablet Take 1 tablet (10 mg total) by mouth at bedtime.  30 tablet  11  . pantoprazole (PROTONIX) 40 MG tablet Take 1 tablet (40 mg total) by mouth at bedtime.  90 tablet  2  . polyethylene glycol (MIRALAX / GLYCOLAX) packet Take 17 g by mouth daily.  14 each  0  . potassium chloride (K-DUR,KLOR-CON) 10 MEQ tablet Take 2 tablets (20 mEq total) by mouth daily.  60 tablet  11  . SPIRIVA HANDIHALER 18 MCG inhalation capsule inhale the contents of one capsule in the handihaler once daily  30 capsule  4   No current facility-administered medications for this visit.    Past Medical History  Diagnosis Date  . IBS (irritable bowel syndrome)   . Hyperlipidemia   . Allergic rhinitis   . COPD (chronic obstructive pulmonary disease)   .  Asthma   . Lung nodule     RLL nodule-72mm stable 2006, April 2009, and June 2009  . Candidiasis of breast   . Anemia   . Hyperlipemia   . Obesity   . Unspecified essential hypertension   . Sleep difficulties   . Hemorrhoids   . Sleep apnea     PT TOLD BORDERLINE-TRIED CPAP-DID NOT HELP-SHE DOES NOT USE CPAP  . Arthritis   . 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 History of radiation therapy 05/30/11 to 07/07/11    rectum  . Prophylactic chemotherapy     Will finish chemo on 03/04/12  . Pneumonia     hx of several times   . Shingles   . rectal ca 04/2011  . Severe aortic valve stenosis  AVR 3/15 10/01/2013    mild-mod by echo 02/2014  . Shortness of breath   . Rheumatic heart disease mitral stenosis     mod MS  by echo 02/2014  . Anxiety     Past Surgical History  Procedure Laterality Date  . Foot surgery  left foot    hammer toe  . Hand surgery  Bil    carpal tunnel  . Back surgery      2006 SPACER---2007 LUMBAR FUSION  . Tonsillectomy    . Colon resection  08/25/2011    Procedure: COLON RESECTION LAPAROSCOPIC;  Surgeon: Stark Klein, MD;  Location: [facility];  Service: General;  Laterality: N/A;  Laparoscopic Assisted Low Anterior Resection Diverting Ostomy and onQ pain pump  . Ileostomy  08/25/2011  . Colon surgery    . Ileostomy closure  04/02/2012    Procedure: ILEOSTOMY TAKEDOWN;  Surgeon: Stark Klein, MD;  Location: [facility];  Service: General;  Laterality: N/A;  . Bowel resection  04/02/2012    Procedure: SMALL BOWEL RESECTION;  Surgeon: Stark Klein, MD;  Location: [facility];  Service: General;  Laterality: N/A;  . Carpal tunnel release    . Cardiac catheterization  11/27/2013  . Tee without cardioversion N/A 11/29/2013    Procedure: TRANSESOPHAGEAL ECHOCARDIOGRAM (TEE);  Surgeon: Sueanne Margarita, MD;  Location: [facility];  Service: Cardiovascular;  Laterality: N/A;  . Cardiac valve replacement    . Aortic valve replacement N/A 12/12/2013    Procedure: AORTIC VALVE REPLACEMENT (AVR);  Surgeon: Gaye Pollack, MD;  Location: [facility];  Service: Open Heart Surgery;  Laterality: N/A;  . Mitral valve replacement N/A 12/12/2013    Procedure: MITRAL VALVE (MV) REPLACEMENT OR REPAIR;  Surgeon: Gaye Pollack, MD;  Location: [facility];  Service: Open Heart Surgery;  Laterality: N/A;  . Intraoperative transesophageal echocardiogram N/A 12/12/2013    Procedure: INTRAOPERATIVE TRANSESOPHAGEAL ECHOCARDIOGRAM;  Surgeon: Gaye Pollack, MD;  Location: [facility] OR;  Service: Open Heart Surgery;  Laterality: N/A;    Family History  Problem Relation Age of Onset  . Lung cancer Father   . Cancer Father     lung  . Diabetes    . Heart disease    . Heart disease Mother   . Diabetes Mother   . Hypertension Mother   .  Colon cancer Neg Hx   . Esophageal cancer Neg Hx   . Stomach cancer Neg Hx   . Rectal cancer Neg Hx     History   Social History  . Marital Status: Married    Spouse Name: N/A    Number of Children: N/A  . Years of Education: N/A   Occupational History  . retired  Social History Main Topics  . Smoking status: Former Smoker -- 1.50 packs/day for 10 years    Types: Cigarettes    Quit date: 12/27/1974  . Smokeless tobacco: Never Used  . Alcohol Use: No     Comment: rare  . Drug Use: No  . Sexual Activity: No   Other Topics Concern  . Not on file   Social History Narrative   Married 1966   2 sons; 1 grandchild   Retired-[employer]   Doing ok   March '09-financial tough times    Review of systems: The patient specifically denies any chest pain at rest or with exertion, dyspnea at rest, orthopnea, paroxysmal nocturnal dyspnea, syncope, palpitations, focal neurological deficits, intermittent claudication, lower extremity edema, unexplained weight gain, cough, hemoptysis or wheezing.  The patient also denies abdominal pain, nausea, vomiting, dysphagia, diarrhea, constipation, polyuria, polydipsia, dysuria, hematuria, frequency, urgency, abnormal bleeding or bruising, fever, chills, unexpected weight changes, mood swings, change in skin or hair texture, change in voice quality, auditory or visual problems, allergic reactions or rashes, new musculoskeletal complaints other than usual "aches and pains".   PHYSICAL EXAM BP 132/73  Pulse 102  Resp 20  Ht 5\' 8"  (1.727 m)  Wt 213 lb 12.8 oz (96.979 kg)  BMI 32.52 kg/m2 General: Alert, oriented x3, no distress  Head: no evidence of trauma, PERRL, EOMI, no exophtalmos or lid lag, no myxedema, no xanthelasma; normal ears, nose and oropharynx  Neck: normal jugular venous pulsations and no hepatojugular reflux; brisk carotid pulses without delay and no carotid bruits  Chest: Well-healed sternotomy scar clear to auscultation, no signs of  consolidation by percussion or palpation, normal fremitus, symmetrical and full respiratory excursions  Cardiovascular: normal position and quality of the apical impulse, regular rhythm, normal first and second heart sounds, or prosthetic valve clicks, grade 9-4/4 systolic ejection murmur at the aortic focus is early peaking, no diastolic murmurs, rubs or gallops  Abdomen: no tenderness or distention, no masses by palpation, no abnormal pulsatility or arterial bruits, normal bowel sounds, no hepatosplenomegaly  Extremities: no clubbing, cyanosis or edema; 2+ radial, ulnar and brachial pulses bilaterally; 2+ right femoral, posterior tibial and dorsalis pedis pulses; 2+ left femoral, posterior tibial and dorsalis pedis pulses; no subclavian or femoral bruits  Neurological: grossly nonfocal.   Lipid Panel     Component Value Date/Time   CHOL 181 07/05/2013 0850   TRIG 117 07/05/2013 0850   HDL 51 07/05/2013 0850   CHOLHDL 3.5 07/05/2013 0850   VLDL 23 07/05/2013 0850   LDLCALC 107* 07/05/2013 0850    BMET    Component Value Date/Time   NA 138 01/08/2014 0909   NA 141 07/23/2013 1303   NA 142 03/15/2012 0923   K 4.2 01/08/2014 0909   K 4.5 07/23/2013 1303   K 4.6 03/15/2012 0923   CL 101 01/08/2014 0909   CL 104 11/15/2012 0908   CL 103 03/15/2012 0923   CO2 26 01/08/2014 0909   CO2 26 07/23/2013 1303   CO2 29 03/15/2012 0923   GLUCOSE 102* 01/08/2014 0909   GLUCOSE 81 07/23/2013 1303   GLUCOSE 93 11/15/2012 0908   GLUCOSE 105 03/15/2012 0923   BUN 17 01/08/2014 0909   BUN 22.6 07/23/2013 1303   BUN 18 03/15/2012 0923   CREATININE 1.09 01/08/2014 0909   CREATININE 1.10 12/21/2013 0513   CREATININE 0.9 07/23/2013 1303   CALCIUM 9.4 01/08/2014 0909   CALCIUM 9.4 07/23/2013 1303   CALCIUM 9.0 03/15/2012 0923  GFRNONAA 50* 12/21/2013 0513   GFRAA 68* 12/21/2013 0513     ASSESSMENT AND PLAN S/P aortic and mitral valve bioprostheses - 12/2013 She has a relatively small 21 mm aortic valve biological prosthesis and  may have some degree of patient valve mismatch that could contribute to her dyspnea. We'll repeat her hemoglobin since she still looks a little pale and check echocardiogram for prosthetic gradients. She appears to have some pulmonary problems that are likely contributing to her dyspnea as well and continues to be obese.   No orders of the defined types were placed in this encounter.   Meds ordered this encounter  Medications  . furosemide (LASIX) 40 MG tablet    Sig: Take 1 tablet (40 mg total) by mouth daily.    Dispense:  30 tablet    Refill:  11  . potassium chloride (K-DUR,KLOR-CON) 10 MEQ tablet    Sig: Take 2 tablets (20 mEq total) by mouth daily.    Dispense:  60 tablet    Refill:  [address] [place], [facility] [phone] office [phone] pager

## 2014-05-12 NOTE — Assessment & Plan Note (Signed)
She has a relatively small 21 mm aortic valve biological prosthesis and may have some degree of patient valve mismatch that could contribute to her dyspnea. We'll repeat her hemoglobin since she still looks a little pale and check echocardiogram for prosthetic gradients. She appears to have some pulmonary problems that are likely contributing to her dyspnea as well and continues to be obese.

## 2014-05-12 NOTE — Patient Instructions (Signed)
Your physician has requested that you have an echocardiogram. Echocardiography is a painless test that uses sound waves to create images of your heart. It provides your doctor with information about the size and shape of your heart and how well your heart's chambers and valves are working. This procedure takes approximately one hour. There are no restrictions for this procedure.  Your physician recommends that you return for lab work in: [facility] lab.  Your physician has recommended that you wear an event monitor for 14 days. Event monitors are medical devices that record the heart's electrical activity. Doctors most often [place] these monitors to diagnose arrhythmias. Arrhythmias are problems with the speed or rhythm of the heartbeat. The monitor is a small, portable device. You can wear one while you do your normal daily activities. This is usually used to diagnose what is causing palpitations/syncope (passing out).  Dr. Sallyanne Kuster recommends that you schedule a follow-up appointment in: 6 weeks.

## 2014-05-13 LAB — COMPREHENSIVE METABOLIC PANEL
ALT: 10 U/L (ref 0–35)
AST: 24 U/L (ref 0–37)
Albumin: 4.1 g/dL (ref 3.5–5.2)
Alkaline Phosphatase: 101 U/L (ref 39–117)
BUN: 24 mg/dL — ABNORMAL HIGH (ref 6–23)
CO2: 27 mEq/L (ref 19–32)
Calcium: 9.3 mg/dL (ref 8.4–10.5)
Chloride: 100 mEq/L (ref 96–112)
Creat: 1.21 mg/dL — ABNORMAL HIGH (ref 0.50–1.10)
Glucose, Bld: 84 mg/dL (ref 70–99)
Potassium: 4.6 mEq/L (ref 3.5–5.3)
Sodium: 141 mEq/L (ref 135–145)
Total Bilirubin: 0.7 mg/dL (ref 0.2–1.2)
Total Protein: 6.7 g/dL (ref 6.0–8.3)

## 2014-05-14 ENCOUNTER — Encounter (HOSPITAL_COMMUNITY): Payer: Self-pay

## 2014-05-16 ENCOUNTER — Encounter (HOSPITAL_COMMUNITY): Payer: Self-pay

## 2014-05-20 ENCOUNTER — Ambulatory Visit (HOSPITAL_COMMUNITY)
Admission: RE | Admit: 2014-05-20 | Discharge: 2014-05-20 | Disposition: A | Discharge status: Home / Self Care | Payer: Medicare Other | Source: Ambulatory Visit | Attending: Cardiology | Admitting: Cardiology

## 2014-05-20 DIAGNOSIS — Z954 Presence of other heart-valve replacement: Secondary | ICD-10-CM | POA: Insufficient documentation

## 2014-05-20 DIAGNOSIS — R0602 Shortness of breath: Secondary | ICD-10-CM | POA: Diagnosis present

## 2014-05-20 DIAGNOSIS — Z952 Presence of prosthetic heart valve: Secondary | ICD-10-CM

## 2014-05-20 DIAGNOSIS — I099 Rheumatic heart disease, unspecified: Secondary | ICD-10-CM | POA: Diagnosis not present

## 2014-05-20 DIAGNOSIS — R609 Edema, unspecified: Secondary | ICD-10-CM | POA: Diagnosis not present

## 2014-05-20 DIAGNOSIS — G4733 Obstructive sleep apnea (adult) (pediatric): Secondary | ICD-10-CM | POA: Insufficient documentation

## 2014-05-20 DIAGNOSIS — I359 Nonrheumatic aortic valve disorder, unspecified: Secondary | ICD-10-CM | POA: Insufficient documentation

## 2014-05-20 DIAGNOSIS — Z87891 Personal history of nicotine dependence: Secondary | ICD-10-CM | POA: Diagnosis not present

## 2014-05-20 DIAGNOSIS — J45909 Unspecified asthma, uncomplicated: Secondary | ICD-10-CM | POA: Diagnosis not present

## 2014-05-20 DIAGNOSIS — R55 Syncope and collapse: Secondary | ICD-10-CM

## 2014-05-20 NOTE — Progress Notes (Signed)
2D Echocardiogram Complete.  05/20/2014   Radin Raptis Manning, [place]

## 2014-05-21 ENCOUNTER — Encounter: Payer: Self-pay | Admitting: Family Medicine

## 2014-05-21 ENCOUNTER — Encounter (HOSPITAL_COMMUNITY): Payer: Self-pay

## 2014-05-23 ENCOUNTER — Encounter (HOSPITAL_COMMUNITY): Payer: Self-pay

## 2014-05-28 ENCOUNTER — Encounter (HOSPITAL_COMMUNITY): Payer: Self-pay

## 2014-05-30 ENCOUNTER — Encounter (HOSPITAL_COMMUNITY): Payer: Self-pay

## 2014-05-30 ENCOUNTER — Encounter: Payer: Self-pay | Admitting: Cardiovascular Disease

## 2014-06-02 ENCOUNTER — Encounter: Payer: Self-pay | Admitting: Internal Medicine

## 2014-06-02 ENCOUNTER — Ambulatory Visit (INDEPENDENT_AMBULATORY_CARE_PROVIDER_SITE_OTHER): Payer: Medicare Other | Admitting: Internal Medicine

## 2014-06-02 ENCOUNTER — Telehealth: Payer: Self-pay | Admitting: Internal Medicine

## 2014-06-02 VITALS — BP 126/82 | HR 77 | Ht 68.0 in | Wt 214.0 lb

## 2014-06-02 DIAGNOSIS — R0600 Dyspnea, unspecified: Secondary | ICD-10-CM

## 2014-06-02 DIAGNOSIS — R0609 Other forms of dyspnea: Secondary | ICD-10-CM

## 2014-06-02 DIAGNOSIS — J449 Chronic obstructive pulmonary disease, unspecified: Secondary | ICD-10-CM

## 2014-06-02 DIAGNOSIS — R0989 Other specified symptoms and signs involving the circulatory and respiratory systems: Secondary | ICD-10-CM

## 2014-06-02 DIAGNOSIS — Z23 Encounter for immunization: Secondary | ICD-10-CM

## 2014-06-02 NOTE — Telephone Encounter (Signed)
[name]  How confident ar you dyspnea is due to prosthetic valve mismatch? She is wanting to know holter results. Do you need cpst for her dyspnea?  THanks  Dr. Brand Males, [name], [facility].C.P Pulmonary and Critical Care Medicine Staff Physician [facility] Pager: [phone], If no answer or between  15:00h - 7:00h: call [phone]  06/02/2014 9:39 AM

## 2014-06-02 NOTE — Telephone Encounter (Deleted)
MR, I believe this was sent to triage in error  Please advise if there is something needed thanks

## 2014-06-02 NOTE — Progress Notes (Signed)
Subjective:    Patient ID: [name], female    DOB: 06/23/1946, 68 y.o.   MRN: [id number]  HPI   #Obese  Body mass index is 34.85 kg/(m^2). Body mass index is 35.98 kg/(m^2). - 05/03/2013 Body mass index is 35.25 kg/(m^2). on 06/25/2013      #Ex 15 pack smoker. Quit 1970s  #COPD/ASthma - Baseline CAT score 18 in May 2013 (poor score is 4 sleep quality, level 3 for dyspnea/fatigue)  #Pulmonary nodules  -  Colon Cancer during cancer Rx had CT chest Oct 2012 and pulmonary nodules noted  - October 2012: There is an irregular nodule in the right upper lobe measuring 12 mm which is unchanged from prior and has adjacent bronchiectasis also unchanged. Within the left lower lobe, there is a 4 mm pulmonary nodule (image 30) which is unchanged.  - October 2013: No change on CT chest Oct 2013   #colon Cancer  - 2012 (early) - s/p surgery and chemo  #[place] valve stenosis  0 Dr. Rollene Fare  #Exacerbations of obstructive lung disease   - November 2013: Treatment from office with Levaquin and Medrol Dosepak  - March 2-14: Prednisone opd for flare up     OV .01/01/2013  Follows up for COPD/asthma.  - She is reporting chronic worsening of dyspnea for the last several months but the worsening is mild overall that she feels stable. In fact on COPD cat score is only 9 and is much better than baseline of 18 and also much improved compared to 38 at last visit in November 2013 when she was in an exacerbation. She says that her aortic  valve stenosis is stable and is being monitored by [facility] cardiology Dr. Rollene Fare. She feels deconditioned and is wondering if she needs to be attend pulmonary rehabilitation maintenance program. She denies any overt wheezing or worsening cough or sputum but on exam she was actively wheezing this visit   REC  #COPD  - You have mild wheezing even though you feel well  Take prednisone 40 mg daily x 2 days, then 20mg  daily x 2 days, then 10mg  daily  x 2 days, then 5mg  daily x 2 days and stop  I will re\re refer you to pulmonary rehabilitation, you can start that for maintenance program  Start N. acetylcysteine 600 mg twice a day  - [place] study published in [place] in 2014 shows it helps  - this is not to make you feel better but to to prevent recurrent bronchitis episodes  - You are more likely to get this drug at [facility] or a vitamin store then at [employer] or [place] or [employer]  - You can also get this at website [employer]  - It functions as an anti-oxidant and there are minimal/none side effects  #Followup  4-5 months with COPD cat score at followup   OV 05/03/2013  FU for copd/asthma  - Has been told that AS is severe and needs AVR. SO she has joined rehab. Has seen dr Cyndia Bent. COPD itrself is stable. No AECOPD. Continues with mdi. Feels good. Last time she was asymtoatic and we gave pred becuaes of wheeeon exam. THis timme too though baseline has occ wheeze despite spiriva and advair.  COPD CAT score is 19 but says this is stable.   HEr obesity continues to be a problem; says she is trying not to eat and joining rehab but unable to lose weight. I counseled her about diet and she rolled her eyes  when I suggested list of foods to eat and list of foods to avoid. She acknowledged that it would be difficult for her to make a change. She thinks that weight loss involves eating less. She does not realize that weight loss involves eating different which he admits is a harder challenge      Past, Family, Social reviewed: Has seen Dr Cyndia Bent for aortic stenosis and has been recommended valve replacement   #COPD  - some wheezing depsite subjective baseline state - continue spiriva  - chagne ADVAIR to BREO 1 puff daily - flu shot in fall  - continue rehab  #Weight mgmt #WEight Management    - we discussed extensively about weight management   - follow low glycemic diet plan that I outlined for you after extensive  discussion. Do not follow other plans  #ROV 2 months    OV 06/25/2013 Followup COPD and weight management of obesity  - Terms of weight management: She has not lost any weight. She flatly told me that she's not interested in the low glycemic diet because it is too difficult.  - Terms of COPD: She's not taking Spiriva and Brio. After change to Brio the COPD cat score is improved and is now 12.5. She feels a COPD status is stable. She is compliant with her medications. Especially Spiriva and Brio. She has not had a flu shot but will have it at her work at [employer] hospital. She wants Pneumovax administered today.  In terms of her attic stenosis: She does not know her surgery date and a followup with her cardiologist is pending    10/22/2013 [facility] follow up  Patient returns for a post hospital followup. Patient was admitted January 1 through January 6 for pneumococcal pneumonia, and COPD, exacerbation. Her condition is complicated by severe aortic valve stenosis, and acute on chronic congestive heart failure. CXR showed RLL infiltrate.  Her urine strep was positive. Ultimately her respiratory status improved aggressive IV antibiotics, nebulized bronchodilators, IV steroids, and diuresis. Patient's troponin was elevated. She was seen by cardiology and has an outpatient catheter planned She was discharged on Levaquin and a Medrol Dosepak. She has finished both of these prescriptions. 2-D echo showed an ejection fracture of 65%, severe aortic stenosis, severe mitral valve calcification and mildly dilated. Right ventricle. Since discharge, she is feeling some better but still weak.  CXR today shows some improvement in RLL infiltrate.  Weight is down 16 pounds. Patient says that she has no lower showed a swelling. She denies any hemoptysis, chest pain, orthopnea, PND, or fever Appetite is slowly returning. She has no nausea, vomiting, diarrhea.    OV 11/19/2013 Chief Complaint  Patient  presents with  . COPD    Pt c/o voice loss that started today. Pt denies any cough, congestion, chest pain, tightness.     Followup COPD in the setting of obesity and severe aortic stenosis  She continues to get better overall but still feeling weak and deconditioned. In fact COPD cat score is higher than baseline at 20. She does not have any COPD exacerbation symptoms although she does have some hoarseness of voice that started today. She feels that this might be a precursor to impending COPD exacerbation but she actively denies worsening cough or sputum production of fever or chills. Chest x-ray today shows mild residual right lower lobe infiltrate and a right small effusion  In terms of her aortic stenosis: She's having a cardiac cath at the end of this month  every 2015. This is been postponed to this date because of her pneumococcal pneumonia. She is thinking about postponing her aortic valve surgery to May 2015. I've told her that she should consider getting this done as early as possible instead of trying to postpone issue   Dg Chest 2 View  11/19/2013   CLINICAL DATA:  Followup pneumonia, shortness of breath, history COPD, hypertension, asthma  EXAM: CHEST  2 VIEW  COMPARISON:  10/22/2013  FINDINGS: Enlargement of cardiac silhouette.  Mediastinal contours and pulmonary vascularity normal.  Minimal right apical pleural thickening stable.  Emphysematous and bronchitic changes consistent with COPD.  Mild residual right basilar infiltrate.  Minimal scarring at left base.  Remaining lungs clear.  No pneumothorax.  Question minimal right pleural effusion.  Mitral annular calcification again noted.  IMPRESSION: COPD changes with mild residual infiltrate at right base and question minimal right pleural effusion.   Electronically Signed   By: Lavonia Dana [name]   On: 11/19/2013 16:07     OV 01/29/2014  Chief Complaint  Patient presents with  . COPD    follow-up. Pt states she is having more SOB  since discharge. Pt is also having some hoarseness today.      Followup obstructive lung disease: COPD/asthma  Since I last saw her she had aortic  valve and mitral valve surgery. She has done well. Apparently the surgery was long and complicated due to extensive calcification of the aortic valve. She was anemic postoperatively but since then her hemoglobin is returned 11 g percent on 01/08/2014. She is also followed with cardiology Dr. Loletha Grayer. on 01/08/2014 and it appears based on his notes that she is doing well. Cardiac rehabilitation postoperative is still pending. Her main issue is that she is fatigued and shortness of breath with exertion but apparently she does not desaturate but gets easily tachycardic. She is worried about this but denies any active cough or wheezing. COPD cat score is 14 and a slightly better than baseline and it appears the most significant component of this is dyspnea and fatigue with cough being minimal as before. Most recent chest x-ray the first 2015 is clear except for minimal right pleural effusion.  Walking desaturation test her and made her feet x3 laps on room air: Resting pulse ox was 96% he did resting heart rate was 90 per minute. She was only able to walk 2 laps out of the 3 and stop due to dyspnea and fatigue. Resting pulse ox was stable at 97%. However heart rate jumped up 120 per minute. At one point the machine even picked up a heart rate of 150 per minute but this is very transient  Past and social history reviewed: This is as above but also the fact she is going to [place] in in May 2015 in [place] and she wants COPD exacerbation antibiotics   OV 02/12/2014  Chief Complaint  Patient presents with  . COPD    acute visit.  took Doxycycline and medrol dosepak rx'd at last ov d/t hoarseness, upper airway wheezing, dry cough, chest congestion.  denies f/c/s, dyspnea, hemoptysis, nausea, vomiting     Followup obstructive lung disease: COPD/asthma. Status  post aortic and mitral valve surgery with postoperative dyspnea/fatigue with workup in progress   The main issue at last visit was fatigued. Her cardiologist Dr. Loletha Grayer. has performed an echo a few days ago the results of which are reviewed below but she is unaware of the results. I do not know  how to interpret these results in the context of postoperative findings and her symptoms so I will defer that to her cardiologist. However, she feels that her fatigue/dyspnea slightly better and this is associated with ongoing cardiopulmonary rehabilitation.  The main issue now is that several days ago 2 weeks ago she fell ill with sore throat and increased rhinorrhea and sinus drainage. She self treated herself because of associated chest tightness and wheezing with Medrol and doxycycline. She's completes one week of doxycycline tomorrow or the day after but she's not better. She finished her Medrol today. She fell she initially improved but then got worse. She is persistent sore throat and upper chest tightness with wheezing and cough. She is due to travel or 02/17/2014 to [place] and therefore is here acutely to make sure it is safe for her to travel   Study Conclusions ECHO 02/10/14:   - Left ventricle: There is a mid cavity dynamic obstruction with a peak gradient at rest of 49mmHg which increases to 18mmHg with Valsalva The cavity size was normal. There was moderate focal basal and mild concentric hypertrophy. Systolic function was normal. The estimated ejection fraction was in the range of 60% to 65%. Wall motion was normal; there were no regional wall motion abnormalities. - Aortic valve: The AV is poorly visualized and the AV leaflets are not adequately seen to determine mobility. The AVA calculates the AS at moderate but by peak velocity and mean gradient it is in the mild range Poorly visualized. Severe thickening and calcification. There was mild stenosis. Valve area: 1.06cm^2(VTI). Valve  area: 0.96cm^2 (Vmax).  - Mitral valve: Severely calcified annulus. Moderate diffuse thickening and calcification, with moderate involvement of chords. Mobility was restricted. The findings are consistent with moderate stenosis. Valve area by pressure half-time: 2.24cm^2. Valve area by continuity equation (using LVOT flow): 1.05cm^2.  - Right ventricle: The cavity size was mildly dilated. Wall thickness was normal. Transthoracic echocardiography. M-mode, complete 2D, spectral Doppler, and color Doppler. Height: Height: 172.7cm. Height: 68in. Weight: Weight: 96.2kg. Weight: 211.6lb. Body mass index: BMI: 32.2kg/m^2. Body surface area: BSA: 2.73m^2. Blood pressure: 130/70. Patient status: Outpatient. Location: Echo laboratory.  REC  #thrush # Oral thrush - For Oral thrush: Take Suspension (swish and swallow): 500,000 units 4 times/day for 5 days; swish in the mouth and retain for as long as possible (several minutes) before swallowing - make sure you rinse your mouth after BREO - if recurs, persists might need to consider systemic diflucan or another MDI  #COPD Flare up  - you stil have wheezing - do medrol dose pak but start from day 2  - take omnicef 300mg  twice daily x 5 days   - call your coumadin clinic and make sure is ok to take omnicef and check INR before travel - I will write to Dr C and make sure he calls you with echo report  #Followup  3 months or sooner if needed  OV 06/02/2014  Chief Complaint  Patient presents with  . Follow-up    Pt c/o DOE. Pt is checking her O2 sats and they are always above 92%. Pt states she feels her heart rate is rapid when active and is currently seeing her cardiologist for this. Pt denies cough and CP/tightness.    Followup COPD  - Overall COPD stable. She continues her inhalers without fail. She continues to experience dyspnea but is worse after a heart valve aortic valve surgery. She feels that this has not pulmonary related but  cardiac related. She is seeing Dr. Loletha Grayer. 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 She has had monitor test but results are pending. She is to follow up with Dr. Loletha Grayer. There are no new issues. She is frustrated by her dyspnea. It is not clear if a cardiopulmonary stress test is indicated to sort out her dyspnea and I will check this with her cardiologist. She wishes to have Prevnar vaccine today. Flu shot she will have when it is available  Past and social history: She has completed her to [place] on the [place] but she organized a reunion  CAT COPD Symptom & Quality of Life Score ([employer] [employer]) 0 is no burden. 5 is highest burden 09/04/2012 Exacerbation  01/01/2013  05/03/2013  06/25/2013 With breo 11/19/2013  01/29/2014   Never Cough -> Cough all the time 4 2 2  1.5 1 1   No phlegm in chest -> Chest is full of phlegm 4 0.5 1 0.5 1 1   No chest tightness -> Chest feels very tight 5 0.5 3 1.5 2 0  No dyspnea for 1 flight stairs/hill -> Very dyspneic for 1 flight of stairs 5 3.5 4 4 5 5   No limitations for ADL at home -> Very limited with ADL at home 5 1.5 2 2 4 3   Confident leaving home -> Not at all confident leaving home 5 0 1 0 1 0  Sleep soundly -> Do not sleep soundly because of lung condition 5 0 4 0 4 0  Lots of Energy -> No energy at all 5 1 2.5 3 2.5 4  TOTAL Score (max 40)  38 9 19.5 12.5 20.5 14      Review of Systems  Constitutional: Negative for fever and unexpected weight change.  HENT: Negative for congestion, dental problem, ear pain, nosebleeds, postnasal drip, rhinorrhea, sinus pressure, sneezing, sore throat and trouble swallowing.   Eyes: Negative for redness and itching.  Respiratory: Positive for shortness of breath. Negative for cough, chest tightness and wheezing.   Cardiovascular: Positive for palpitations. Negative for leg swelling.  Gastrointestinal: Negative for nausea and vomiting.  Genitourinary: Negative for dysuria.   Musculoskeletal: Negative for joint swelling.  Skin: Negative for rash.  Neurological: Negative for headaches.  Hematological: Does not bruise/bleed easily.  Psychiatric/Behavioral: Negative for dysphoric mood. The patient is not nervous/anxious.        Objective:   Physical Exam  \ Filed Vitals:   06/02/14 0916  BP: 126/82  Pulse: 77  Height: 5\' 8"  (1.727 m)  Weight: 214 lb (97.07 kg)  SpO2: 97%   Vitals reviewed. Constitutional: She is oriented to person, place, and time. She appears well-developed and well-nourished. No distress.  Body mass index is 31.79 kg/(m^2). Looks better and more conditioned than last visit   HENT:  Head: Normocephalic and atraumatic.  Right Ear: External ear normal.  Left Ear: External ear normal.  Mouth/Throat: Oropharynx is clear and moist. No oropharyngeal exudate.  Eyes: Conjunctivae and EOM are normal. Pupils are equal, round, and reactive to light. Right eye exhibits no discharge. Left eye exhibits no discharge. No scleral icterus.  Neck: Normal range of motion. Neck supple. No JVD present. No tracheal deviation present. No thyromegaly present.  Cardiovascular: Normal rate, regular rhythm, normal heart sounds and intact distal pulses.  Exam reveals no gallop and no friction rub.   No murmur heard. Pulmonary/Chest: Effort normal. No respiratory distress. She has no wheezes today She has no rales. She  exhibits no tenderness.   Abdominal: Soft. Bowel sounds are normal. She exhibits no distension and no mass. There is no tenderness. There is no rebound and no guarding.  Musculoskeletal: Normal range of motion. She exhibits no edema and no tenderness.  Lymphadenopathy:    She has no cervical adenopathy.  Neurological: She is alert and oriented to person, place, and time. She has normal reflexes. No cranial nerve deficit. She exhibits normal muscle tone. Coordination normal.  Skin: Skin is warm and dry. No rash noted. She is not diaphoretic. No  erythema. No pallor.  Psychiatric: She has a normal mood and affect. Her behavior is normal. Judgment and thought content normal.         Assessment & Plan:  #COPD  - is stable  - continue current treatment plan  - have prevnar vaccine 06/02/2014 - flu shot in fall ; please have as soon as available  #Shortnes off breath - I have written to dR C if you would need a CPST bike test to sort out shortness of breath   #FOllowup  6 months or sooner if needed

## 2014-06-02 NOTE — Patient Instructions (Addendum)
#  COPD  - is stable  - continue current treatment plan  - have prevnar vaccine 06/02/2014 - flu shot in fall ; please have as soon as available  #Shortnes off breath - I have written to dR C if you would need a CPST bike test to sort out shortness of breath   #FOllowup  6 months or sooner if needed

## 2014-06-03 NOTE — Telephone Encounter (Signed)
Event monitor showed brief paroxysmal atrial tachycardia, frequent PACs and PVCs. Atrial fibrillation is not seen [name], could you please let the patient know that - looks like she never got results; no change in meds)  It's hard to prove that her dyspnea is just from patient-valve mismatch. I am sure that obesity and deconditioning are playing a part. I think a CPST is a great idea, [name]. Would you like to schedule it? [name]

## 2014-06-03 NOTE — Telephone Encounter (Signed)
Called and spoke to pt. Informed her of the recs per MR. Pt verbalized understanding and denied any further questions or concerns at this time. Informed pt that someone will be contacting her to schedule CPST before 06/06/14. Order placed. Nothing further needed.

## 2014-06-03 NOTE — Telephone Encounter (Signed)
[name] (Copy to Dr Albertine Patricia)  Please let Ms Anzal ELEXUS BARMAN know that Dr C and I discussed adn we think CPST is way forward to figure out dyspnea. Please try to get it on or before this frfiday 06/06/14 which is last date for Mr [name] our techniciian  THanks  Dr. Brand Males, [name], [facility].C.P Pulmonary and Critical Care Medicine Staff Physician [place] [facility] Pager: [phone], If no answer or between  15:00h - 7:00h: call [phone]  06/03/2014 2:48 PM

## 2014-06-04 ENCOUNTER — Encounter (HOSPITAL_COMMUNITY): Payer: Self-pay

## 2014-06-06 ENCOUNTER — Encounter (HOSPITAL_COMMUNITY): Payer: Self-pay

## 2014-06-08 ENCOUNTER — Telehealth: Payer: Self-pay | Admitting: Internal Medicine

## 2014-06-08 NOTE — Assessment & Plan Note (Signed)
Continues to be dyspneic despite aortic valve surgery. D/w Dr Orene Desanctis - doubt this is prosthetic valve mismatch issue. Apparently holter was okay. COPD appears stable. So, will get CPST bike test and reassess

## 2014-06-08 NOTE — Assessment & Plan Note (Signed)
#  COPD  - is stable  - continue current treatment plan  - have prevnar vaccine 06/02/2014 - flu shot in fall ; please have as soon as available

## 2014-06-08 NOTE — Telephone Encounter (Signed)
[name]  Give her fu to see me after cpst; my notes say 6 months  . But has to be sooner because of cpst  Thanks  Dr. Brand Males, [name], [facility].C.P Pulmonary and Critical Care Medicine Staff Physician [place] [facility] Pager: [phone], If no answer or between  15:00h - 7:00h: call [phone]  06/08/2014 7:53 AM

## 2014-06-11 ENCOUNTER — Encounter (HOSPITAL_COMMUNITY): Payer: Self-pay

## 2014-06-13 ENCOUNTER — Telehealth: Payer: Self-pay | Admitting: Internal Medicine

## 2014-06-13 ENCOUNTER — Ambulatory Visit (INDEPENDENT_AMBULATORY_CARE_PROVIDER_SITE_OTHER): Payer: Medicare Other | Admitting: Family Medicine

## 2014-06-13 ENCOUNTER — Ambulatory Visit (HOSPITAL_COMMUNITY): Payer: Medicare Other | Attending: Internal Medicine

## 2014-06-13 ENCOUNTER — Encounter: Payer: Self-pay | Admitting: Family Medicine

## 2014-06-13 ENCOUNTER — Encounter (HOSPITAL_COMMUNITY): Payer: Self-pay

## 2014-06-13 VITALS — BP 124/68 | HR 96 | Temp 98.3°F | Wt 213.5 lb

## 2014-06-13 DIAGNOSIS — Z954 Presence of other heart-valve replacement: Secondary | ICD-10-CM

## 2014-06-13 DIAGNOSIS — R911 Solitary pulmonary nodule: Secondary | ICD-10-CM | POA: Insufficient documentation

## 2014-06-13 DIAGNOSIS — Z9221 Personal history of antineoplastic chemotherapy: Secondary | ICD-10-CM | POA: Insufficient documentation

## 2014-06-13 DIAGNOSIS — I10 Essential (primary) hypertension: Secondary | ICD-10-CM

## 2014-06-13 DIAGNOSIS — J449 Chronic obstructive pulmonary disease, unspecified: Secondary | ICD-10-CM | POA: Insufficient documentation

## 2014-06-13 DIAGNOSIS — E785 Hyperlipidemia, unspecified: Secondary | ICD-10-CM

## 2014-06-13 DIAGNOSIS — R0609 Other forms of dyspnea: Secondary | ICD-10-CM | POA: Insufficient documentation

## 2014-06-13 DIAGNOSIS — R0989 Other specified symptoms and signs involving the circulatory and respiratory systems: Secondary | ICD-10-CM | POA: Insufficient documentation

## 2014-06-13 DIAGNOSIS — J4489 Other specified chronic obstructive pulmonary disease: Secondary | ICD-10-CM | POA: Insufficient documentation

## 2014-06-13 DIAGNOSIS — F411 Generalized anxiety disorder: Secondary | ICD-10-CM | POA: Diagnosis not present

## 2014-06-13 DIAGNOSIS — I080 Rheumatic disorders of both mitral and aortic valves: Secondary | ICD-10-CM | POA: Insufficient documentation

## 2014-06-13 DIAGNOSIS — Z932 Ileostomy status: Secondary | ICD-10-CM | POA: Diagnosis not present

## 2014-06-13 DIAGNOSIS — R0600 Dyspnea, unspecified: Secondary | ICD-10-CM

## 2014-06-13 DIAGNOSIS — Z85048 Personal history of other malignant neoplasm of rectum, rectosigmoid junction, and anus: Secondary | ICD-10-CM | POA: Diagnosis not present

## 2014-06-13 DIAGNOSIS — Z952 Presence of prosthetic heart valve: Secondary | ICD-10-CM

## 2014-06-13 DIAGNOSIS — J309 Allergic rhinitis, unspecified: Secondary | ICD-10-CM | POA: Insufficient documentation

## 2014-06-13 DIAGNOSIS — Z87891 Personal history of nicotine dependence: Secondary | ICD-10-CM | POA: Insufficient documentation

## 2014-06-13 DIAGNOSIS — G4730 Sleep apnea, unspecified: Secondary | ICD-10-CM | POA: Insufficient documentation

## 2014-06-13 DIAGNOSIS — C20 Malignant neoplasm of rectum: Secondary | ICD-10-CM

## 2014-06-13 NOTE — Progress Notes (Signed)
Pre visit review using our clinic review tool, if applicable. No additional management support is needed unless otherwise documented below in the visit note. 

## 2014-06-13 NOTE — Progress Notes (Signed)
BP 124/68  Pulse 96  Temp(Src) 98.3 F (36.8 C) (Oral)  Wt 213 lb 8 oz (96.843 kg)   CC: transfer care from Dr. Linda Hedges  Subjective:    Patient ID: [name], female    DOB: [birth year], 68 y.o.   MRN: [id number]  HPI: [name] is a 68 y.o. 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  Remains dyspneic with exertion. Recently saw cardiology then pulm - planned cardiopulmonary bike stress test.  ?valve mismatch.  Had test this morning, pending results.   COPD/asthma - on breo, spiriva, albuterol prn (rare use). Followed by Dr Lynford Citizen.  H/o rectal cancer s/p latest colonoscopy 03/2014 [facility]). rec rpt 3 yrs.  H/o HTN, HLD stable on current regimen.  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   Relevant past medical, surgical, family and social history reviewed and updated as indicated.  Allergies and medications reviewed and updated. Current Outpatient Prescriptions on File Prior to Visit  Medication Sig  . albuterol (PROVENTIL HFA;VENTOLIN HFA) 108 (90 BASE) MCG/ACT inhaler Inhale 2 puffs into the lungs every 6 (six) hours as needed for wheezing.  Marland Kitchen aspirin 81 MG tablet Take 81 mg by mouth at bedtime.   Marland Kitchen BIOTIN PO Take 1 tablet by mouth daily.  Marland Kitchen BREO ELLIPTA 100-25 MCG/INH AEPB inhale 1 puff by mouth once daily  . Calcium Carbonate-Vitamin D (CALCIUM 600 + D PO) Take 1,200 mg by mouth at bedtime.   Marland Kitchen diltiazem (CARDIZEM CD) 120 MG 24 hr capsule Take 1 capsule (120 mg total) by mouth daily.  . furosemide (LASIX) 40 MG tablet Take 1 tablet (40 mg total) by mouth daily.  Marland Kitchen lovastatin (MEVACOR) 40 MG tablet take 1 tablet by mouth at bedtime  . Miconazole Nitrate 2 % POWD 1 application by Does not apply route daily. Apply to stomach and under breasts  . montelukast (SINGULAIR) 10 MG tablet  Take 1 tablet (10 mg total) by mouth at bedtime.  . pantoprazole (PROTONIX) 40 MG tablet Take 1 tablet (40 mg total) by mouth at bedtime.  . polyethylene glycol (MIRALAX / GLYCOLAX) packet Take 17 g by mouth daily.  . potassium chloride (K-DUR,KLOR-CON) 10 MEQ tablet Take 2 tablets (20 mEq total) by mouth daily.  Marland Kitchen SPIRIVA HANDIHALER 18 MCG inhalation capsule inhale the contents of one capsule in the handihaler once daily   No current facility-administered medications on file prior to visit.   Past Medical History  Diagnosis Date  . IBS (irritable bowel syndrome)   . Allergic rhinitis   . COPD (chronic obstructive pulmonary disease)   . Asthma   . Lung nodule     RLL nodule-75mm stable 2006, April 2009, and June 2009  . Anemia   . Hyperlipemia   . Obesity   . Essential hypertension   . Hemorrhoids   . Sleep apnea     PT TOLD BORDERLINE-TRIED CPAP-DID NOT HELP-SHE DOES NOT USE CPAP  . Arthritis   . History of radiation therapy 05/30/11 to 07/07/11    rectum  . History of pneumonia     several times  . History of shingles   . Rectal cancer 04/2011    s/p ileostomy, s/p reversal, s/p chemo  . Severe aortic valve stenosis  AVR 3/15 10/01/2013    mild-mod by echo 02/2014  . Rheumatic heart  disease mitral stenosis     mod MS by echo 02/2014  . Anxiety   . S/P AVR (aortic valve replacement) 2015    bioprosthetic (Bartle)  . S/P MVR (mitral valve replacement) 2015    bioprosthetic (Bartle)   Review of Systems Per HPI unless specifically indicated above    Objective:    BP 124/68  Pulse 96  Temp(Src) 98.3 F (36.8 C) (Oral)  Wt 213 lb 8 oz (96.843 kg)  Physical Exam  Nursing note and vitals reviewed. Constitutional: She appears well-developed. No distress.  HENT:  Mouth/Throat: Oropharynx is clear and moist. No oropharyngeal exudate.  Cardiovascular: Normal rate, regular rhythm and intact distal pulses.  Exam reveals no gallop and no friction rub.   Murmur (4/6 separate  SEMs) heard. Pulmonary/Chest: Effort normal and breath sounds normal. No respiratory distress. She has no wheezes. She has no rales.  Musculoskeletal: She exhibits no edema.  Skin: Skin is warm and dry. No rash noted.  Psychiatric: She has a normal mood and affect.   Results for orders placed in visit on 05/12/14  CBC      Result Value Ref Range   WBC 5.7  4.0 - 10.5 K/uL   RBC 4.42  3.87 - 5.11 MIL/uL   Hemoglobin 12.2  12.0 - 15.0 g/dL   HCT 37.7  36.0 - 46.0 %   MCV 85.3  78.0 - 100.0 fL   MCH 27.6  26.0 - 34.0 pg   MCHC 32.4  30.0 - 36.0 g/dL   RDW 16.5 (*) 11.5 - 15.5 %   Platelets 210  150 - 400 K/uL  COMPREHENSIVE METABOLIC PANEL      Result Value Ref Range   Sodium 141  135 - 145 mEq/L   Potassium 4.6  3.5 - 5.3 mEq/L   Chloride 100  96 - 112 mEq/L   CO2 27  19 - 32 mEq/L   Glucose, Bld 84  70 - 99 mg/dL   BUN 24 (*) 6 - 23 mg/dL   Creat 1.21 (*) 0.50 - 1.10 mg/dL   Total Bilirubin 0.7  0.2 - 1.2 mg/dL   Alkaline Phosphatase 101  39 - 117 U/L   AST 24  0 - 37 U/L   ALT 10  0 - 35 U/L   Total Protein 6.7  6.0 - 8.3 g/dL   Albumin 4.1  3.5 - 5.2 g/dL   Calcium 9.3  8.4 - 10.5 mg/dL      Assessment & Plan:   Problem List Items Addressed This Visit   S/P aortic and mitral valve bioprostheses - 12/2013     Followed closely by cardiology.    Rectal cancer, midrectum [id number], s/p neoadj chemoXRT, lap LAR with diverting loop ileostomy 08/2011, ileostomy takedown 03/2012     continue miralax, routine surveillance with GI, onc and surgery.    HYPERLIPIDEMIA      Continue lovastatin. Lab Results  Component Value Date   LDLCALC 107* 07/05/2013      Essential hypertension, benign - Primary     Chronic, stable. Continue regimen.        Follow up plan: Return in about 5 months (around 11/13/2014), or as needed, for medicare wellness visit.

## 2014-06-13 NOTE — Telephone Encounter (Signed)
Called and spoke to pt. Appt made for 9/11 at 3:30pm. Pt verbalized understanding and denied any further questions or concerns at this time.

## 2014-06-13 NOTE — Telephone Encounter (Signed)
Called spoke with pt. Made her aware appt is to discuss CPST. Nothing further needed

## 2014-06-13 NOTE — Patient Instructions (Signed)
Call your insurance about the shingles shot to see if it is covered or how much it would cost and where is cheaper (here or pharmacy).  If you want to receive here, call for nurse visit. Good to meet you today, call us with questions. Return as needed or in 4-6 months for medicare wellness visit. Increase water intake.

## 2014-06-14 ENCOUNTER — Encounter: Payer: Self-pay | Admitting: Family Medicine

## 2014-06-14 NOTE — Assessment & Plan Note (Signed)
Chronic, stable. Continue regimen. 

## 2014-06-14 NOTE — Assessment & Plan Note (Signed)
Followed closely by cardiology/  

## 2014-06-14 NOTE — Assessment & Plan Note (Signed)
continue miralax, routine surveillance with GI, onc and surgery.

## 2014-06-14 NOTE — Assessment & Plan Note (Signed)
Continue lovastatin. Lab Results  Component Value Date   LDLCALC 107* 07/05/2013

## 2014-06-16 ENCOUNTER — Encounter: Payer: Self-pay | Admitting: Family Medicine

## 2014-06-18 ENCOUNTER — Encounter (HOSPITAL_COMMUNITY): Payer: Self-pay

## 2014-06-19 ENCOUNTER — Telehealth: Payer: Self-pay | Admitting: Oncology

## 2014-06-19 NOTE — Telephone Encounter (Signed)
returned pt call re change in f/u appt d/t. pt f/u appt moved due to GI [place]. pt given new f/u appt d/t for 10/5 - lab appt remains 10/1.

## 2014-06-20 ENCOUNTER — Ambulatory Visit (INDEPENDENT_AMBULATORY_CARE_PROVIDER_SITE_OTHER): Payer: Medicare Other | Admitting: Internal Medicine

## 2014-06-20 ENCOUNTER — Encounter (HOSPITAL_COMMUNITY): Payer: Self-pay

## 2014-06-20 ENCOUNTER — Encounter: Payer: Self-pay | Admitting: Internal Medicine

## 2014-06-20 VITALS — BP 130/70 | HR 90 | Temp 98.1°F | Ht 68.0 in | Wt 213.4 lb

## 2014-06-20 DIAGNOSIS — R0989 Other specified symptoms and signs involving the circulatory and respiratory systems: Secondary | ICD-10-CM

## 2014-06-20 DIAGNOSIS — R0600 Dyspnea, unspecified: Secondary | ICD-10-CM

## 2014-06-20 DIAGNOSIS — R0609 Other forms of dyspnea: Secondary | ICD-10-CM

## 2014-06-20 NOTE — Patient Instructions (Signed)
#  COPD  - is stable  - continue current treatment plan  - - flu shot in fall ; please have as soon as available @[facility]  for volunteers  #Shortnes off breath - bike test shows this is mainly now due to weight and physical fitness  - refer to pulmonary rehab   #FOllowup  6 months or sooner if needed

## 2014-06-20 NOTE — Progress Notes (Signed)
Subjective:    Patient ID: [name], female    DOB: 02/17/1946, 68 y.o.   MRN: [id number]  HPI  #Obese  Body mass index is 34.85 kg/(m^2). Body mass index is 35.98 kg/(m^2). - 05/03/2013 Body mass index is 35.25 kg/(m^2). on 06/25/2013      #Ex 15 pack smoker. Quit 1970s  #COPD/ASthma - Baseline CAT score 18 in May 2013 (poor score is 4 sleep quality, level 3 for dyspnea/fatigue)  #Pulmonary nodules  -  Colon Cancer during cancer Rx had CT chest Oct 2012 and pulmonary nodules noted  - October 2012: There is an irregular nodule in the right upper lobe measuring 12 mm which is unchanged from prior and has adjacent bronchiectasis also unchanged. Within the left lower lobe, there is a 4 mm pulmonary nodule (image 30) which is unchanged.  - October 2013: No change on CT chest Oct 2013   #colon Cancer  - 2012 (early) - s/p surgery and chemo  #[place] valve stenosis  0 Dr. Rollene Fare  #Exacerbations of obstructive lung disease   - November 2013: Treatment from office with Levaquin and Medrol Dosepak  - March 2-14: Prednisone opd for flare up     OV .01/01/2013  Follows up for COPD/asthma.  - She is reporting chronic worsening of dyspnea for the last several months but the worsening is mild overall that she feels stable. In fact on COPD cat score is only 9 and is much better than baseline of 18 and also much improved compared to 38 at last visit in November 2013 when she was in an exacerbation. She says that her aortic  valve stenosis is stable and is being monitored by [facility] cardiology Dr. Rollene Fare. She feels deconditioned and is wondering if she needs to be attend pulmonary rehabilitation maintenance program. She denies any overt wheezing or worsening cough or sputum but on exam she was actively wheezing this visit   REC  #COPD  - You have mild wheezing even though you feel well  Take prednisone 40 mg daily x 2 days, then 20mg  daily x 2 days, then 10mg  daily x  2 days, then 5mg  daily x 2 days and stop  I will re\re refer you to pulmonary rehabilitation, you can start that for maintenance program  Start N. acetylcysteine 600 mg twice a day  - [place] study published in [place] in 2014 shows it helps  - this is not to make you feel better but to to prevent recurrent bronchitis episodes  - You are more likely to get this drug at [facility] or a vitamin store then at [employer] or [place] or [employer]  - You can also get this at website [employer]  - It functions as an anti-oxidant and there are minimal/none side effects  #Followup  4-5 months with COPD cat score at followup   OV 05/03/2013  FU for copd/asthma  - Has been told that AS is severe and needs AVR. SO she has joined rehab. Has seen dr Cyndia Bent. COPD itrself is stable. No AECOPD. Continues with mdi. Feels good. Last time she was asymtoatic and we gave pred becuaes of wheeeon exam. THis timme too though baseline has occ wheeze despite spiriva and advair.  COPD CAT score is 19 but says this is stable.   HEr obesity continues to be a problem; says she is trying not to eat and joining rehab but unable to lose weight. I counseled her about diet and she rolled her eyes when  I suggested list of foods to eat and list of foods to avoid. She acknowledged that it would be difficult for her to make a change. She thinks that weight loss involves eating less. She does not realize that weight loss involves eating different which he admits is a harder challenge      Past, Family, Social reviewed: Has seen Dr Cyndia Bent for aortic stenosis and has been recommended valve replacement   #COPD  - some wheezing depsite subjective baseline state - continue spiriva  - chagne ADVAIR to BREO 1 puff daily - flu shot in fall  - continue rehab  #Weight mgmt #WEight Management    - we discussed extensively about weight management   - follow low glycemic diet plan that I outlined for you after extensive  discussion. Do not follow other plans  #ROV 2 months    OV 06/25/2013 Followup COPD and weight management of obesity  - Terms of weight management: She has not lost any weight. She flatly told me that she's not interested in the low glycemic diet because it is too difficult.  - Terms of COPD: She's not taking Spiriva and Brio. After change to Brio the COPD cat score is improved and is now 12.5. She feels a COPD status is stable. She is compliant with her medications. Especially Spiriva and Brio. She has not had a flu shot but will have it at her work at [employer] hospital. She wants Pneumovax administered today.  In terms of her attic stenosis: She does not know her surgery date and a followup with her cardiologist is pending    10/22/2013 [facility] follow up  Patient returns for a post hospital followup. Patient was admitted January 1 through January 6 for pneumococcal pneumonia, and COPD, exacerbation. Her condition is complicated by severe aortic valve stenosis, and acute on chronic congestive heart failure. CXR showed RLL infiltrate.  Her urine strep was positive. Ultimately her respiratory status improved aggressive IV antibiotics, nebulized bronchodilators, IV steroids, and diuresis. Patient's troponin was elevated. She was seen by cardiology and has an outpatient catheter planned She was discharged on Levaquin and a Medrol Dosepak. She has finished both of these prescriptions. 2-D echo showed an ejection fracture of 65%, severe aortic stenosis, severe mitral valve calcification and mildly dilated. Right ventricle. Since discharge, she is feeling some better but still weak.  CXR today shows some improvement in RLL infiltrate.  Weight is down 16 pounds. Patient says that she has no lower showed a swelling. She denies any hemoptysis, chest pain, orthopnea, PND, or fever Appetite is slowly returning. She has no nausea, vomiting, diarrhea.    OV 11/19/2013 Chief Complaint  Patient  presents with  . COPD    Pt c/o voice loss that started today. Pt denies any cough, congestion, chest pain, tightness.     Followup COPD in the setting of obesity and severe aortic stenosis  She continues to get better overall but still feeling weak and deconditioned. In fact COPD cat score is higher than baseline at 20. She does not have any COPD exacerbation symptoms although she does have some hoarseness of voice that started today. She feels that this might be a precursor to impending COPD exacerbation but she actively denies worsening cough or sputum production of fever or chills. Chest x-ray today shows mild residual right lower lobe infiltrate and a right small effusion  In terms of her aortic stenosis: She's having a cardiac cath at the end of this month every  2015. This is been postponed to this date because of her pneumococcal pneumonia. She is thinking about postponing her aortic valve surgery to May 2015. I've told her that she should consider getting this done as early as possible instead of trying to postpone issue   Dg Chest 2 View  11/19/2013   CLINICAL DATA:  Followup pneumonia, shortness of breath, history COPD, hypertension, asthma  EXAM: CHEST  2 VIEW  COMPARISON:  10/22/2013  FINDINGS: Enlargement of cardiac silhouette.  Mediastinal contours and pulmonary vascularity normal.  Minimal right apical pleural thickening stable.  Emphysematous and bronchitic changes consistent with COPD.  Mild residual right basilar infiltrate.  Minimal scarring at left base.  Remaining lungs clear.  No pneumothorax.  Question minimal right pleural effusion.  Mitral annular calcification again noted.  IMPRESSION: COPD changes with mild residual infiltrate at right base and question minimal right pleural effusion.   Electronically Signed   By: Lavonia Dana [name]   On: 11/19/2013 16:07     OV 01/29/2014  Chief Complaint  Patient presents with  . COPD    follow-up. Pt states she is having more SOB  since discharge. Pt is also having some hoarseness today.      Followup obstructive lung disease: COPD/asthma  Since I last saw her she had aortic  valve and mitral valve surgery. She has done well. Apparently the surgery was long and complicated due to extensive calcification of the aortic valve. She was anemic postoperatively but since then her hemoglobin is returned 11 g percent on 01/08/2014. She is also followed with cardiology Dr. Loletha Grayer. on 01/08/2014 and it appears based on his notes that she is doing well. Cardiac rehabilitation postoperative is still pending. Her main issue is that she is fatigued and shortness of breath with exertion but apparently she does not desaturate but gets easily tachycardic. She is worried about this but denies any active cough or wheezing. COPD cat score is 14 and a slightly better than baseline and it appears the most significant component of this is dyspnea and fatigue with cough being minimal as before. Most recent chest x-ray the first 2015 is clear except for minimal right pleural effusion.  Walking desaturation test her and made her feet x3 laps on room air: Resting pulse ox was 96% he did resting heart rate was 90 per minute. She was only able to walk 2 laps out of the 3 and stop due to dyspnea and fatigue. Resting pulse ox was stable at 97%. However heart rate jumped up 120 per minute. At one point the machine even picked up a heart rate of 150 per minute but this is very transient  Past and social history reviewed: This is as above but also the fact she is going to [place] in in May 2015 in [place] and she wants COPD exacerbation antibiotics   OV 02/12/2014  Chief Complaint  Patient presents with  . COPD    acute visit.  took Doxycycline and medrol dosepak rx'd at last ov d/t hoarseness, upper airway wheezing, dry cough, chest congestion.  denies f/c/s, dyspnea, hemoptysis, nausea, vomiting     Followup obstructive lung disease: COPD/asthma. Status  post aortic and mitral valve surgery with postoperative dyspnea/fatigue with workup in progress   The main issue at last visit was fatigued. Her cardiologist Dr. Loletha Grayer. has performed an echo a few days ago the results of which are reviewed below but she is unaware of the results. I do not know how  to interpret these results in the context of postoperative findings and her symptoms so I will defer that to her cardiologist. However, she feels that her fatigue/dyspnea slightly better and this is associated with ongoing cardiopulmonary rehabilitation.  The main issue now is that several days ago 2 weeks ago she fell ill with sore throat and increased rhinorrhea and sinus drainage. She self treated herself because of associated chest tightness and wheezing with Medrol and doxycycline. She's completes one week of doxycycline tomorrow or the day after but she's not better. She finished her Medrol today. She fell she initially improved but then got worse. She is persistent sore throat and upper chest tightness with wheezing and cough. She is due to travel or 02/17/2014 to [place] and therefore is here acutely to make sure it is safe for her to travel   Study Conclusions ECHO 02/10/14:   - Left ventricle: There is a mid cavity dynamic obstruction with a peak gradient at rest of 72mmHg which increases to 20mmHg with Valsalva The cavity size was normal. There was moderate focal basal and mild concentric hypertrophy. Systolic function was normal. The estimated ejection fraction was in the range of 60% to 65%. Wall motion was normal; there were no regional wall motion abnormalities. - Aortic valve: The AV is poorly visualized and the AV leaflets are not adequately seen to determine mobility. The AVA calculates the AS at moderate but by peak velocity and mean gradient it is in the mild range Poorly visualized. Severe thickening and calcification. There was mild stenosis. Valve area: 1.06cm^2(VTI). Valve  area: 0.96cm^2 (Vmax).  - Mitral valve: Severely calcified annulus. Moderate diffuse thickening and calcification, with moderate involvement of chords. Mobility was restricted. The findings are consistent with moderate stenosis. Valve area by pressure half-time: 2.24cm^2. Valve area by continuity equation (using LVOT flow): 1.05cm^2.  - Right ventricle: The cavity size was mildly dilated. Wall thickness was normal. Transthoracic echocardiography. M-mode, complete 2D, spectral Doppler, and color Doppler. Height: Height: 172.7cm. Height: 68in. Weight: Weight: 96.2kg. Weight: 211.6lb. Body mass index: BMI: 32.2kg/m^2. Body surface area: BSA: 2.10m^2. Blood pressure: 130/70. Patient status: Outpatient. Location: Echo laboratory.  REC  #thrush # Oral thrush - For Oral thrush: Take Suspension (swish and swallow): 500,000 units 4 times/day for 5 days; swish in the mouth and retain for as long as possible (several minutes) before swallowing - make sure you rinse your mouth after BREO - if recurs, persists might need to consider systemic diflucan or another MDI  #COPD Flare up  - you stil have wheezing - do medrol dose pak but start from day 2  - take omnicef 300mg  twice daily x 5 days   - call your coumadin clinic and make sure is ok to take omnicef and check INR before travel - I will write to Dr C and make sure he calls you with echo report  #Followup  3 months or sooner if needed  OV 06/02/2014  Chief Complaint  Patient presents with  . Follow-up    Pt c/o DOE. Pt is checking her O2 sats and they are always above 92%. Pt states she feels her heart rate is rapid when active and is currently seeing her cardiologist for this. Pt denies cough and CP/tightness.    Followup COPD  - Overall COPD stable. She continues her inhalers without fail. She continues to experience dyspnea but is worse after a heart valve aortic valve surgery. She feels that this has not pulmonary related but  cardiac  related. She is seeing Dr. Loletha Grayer. 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 She has had monitor test but results are pending. She is to follow up with Dr. Loletha Grayer. There are no new issues. She is frustrated by her dyspnea. It is not clear if a cardiopulmonary stress test is indicated to sort out her dyspnea and I will check this with her cardiologist. She wishes to have Prevnar vaccine today. Flu shot she will have when it is available  Past and social history: She has completed her to [place] on the [place] but she organized a reunion    OV 06/20/2014  Chief Complaint  Patient presents with  . Follow-up    Discuss CPST results.     Here to discuss cardiopulmonary stress test results. She underwent the test 06/13/2014. Her baseline spirometry during testing did show moderate obstructive lung disease with an FEV1 of 1.6 L or 64%. However, this was not a cause for her dyspnea. She had 31% ventilatory reserve. The main issue was that the peak VO2 loss 82% for actual body weight but when adjusted for ID of body weight it was 92%. Anaerobic threshold was 64% of normal. Cardiac parameters are normal. Taken together this suggests that the obesity and deconditioning are playing a role in the dyspnea. She says that she has lost some weight and she did undergo cardiac rehabilitation but did not get benefit from that. She is willing to try pulmonary rehabilitation now  Review of Systems  Constitutional: Negative for fever and unexpected weight change.  HENT: Negative for congestion, dental problem, ear pain, nosebleeds, postnasal drip, rhinorrhea, sinus pressure, sneezing, sore throat and trouble swallowing.   Eyes: Negative for redness and itching.  Respiratory: Negative for cough, chest tightness, shortness of breath and wheezing.   Cardiovascular: Negative for palpitations and leg swelling.  Gastrointestinal: Negative for nausea and vomiting.  Genitourinary:  Negative for dysuria.  Musculoskeletal: Negative for joint swelling.  Skin: Negative for rash.  Neurological: Negative for headaches.  Hematological: Does not bruise/bleed easily.  Psychiatric/Behavioral: Negative for dysphoric mood. The patient is not nervous/anxious.        Objective:   Physical Exam  Filed Vitals:   06/20/14 1519  BP: 130/70  Pulse: 90  Temp: 98.1 F (36.7 C)  TempSrc: Oral  Height: 5\' 8"  (1.727 m)  Weight: 213 lb 6.4 oz (96.798 kg)  SpO2: 96%    Discussion only visit       Assessment & Plan:  \ #COPD  - is stable  - continue current treatment plan  - - flu shot in fall ; please have as soon as available @[facility]  for volunteers  #Shortnes off breath - bike test shows this is mainly now due to weight and physical fitness  - refer to pulmonary rehab   #FOllowup  6 months or sooner if needed  (> 50% of this 15 min visit spent in face to face counseling)

## 2014-06-21 ENCOUNTER — Encounter: Payer: Self-pay | Admitting: Family Medicine

## 2014-06-25 ENCOUNTER — Encounter (HOSPITAL_COMMUNITY): Payer: Self-pay

## 2014-06-27 ENCOUNTER — Encounter (HOSPITAL_COMMUNITY): Payer: Self-pay

## 2014-07-02 ENCOUNTER — Ambulatory Visit (INDEPENDENT_AMBULATORY_CARE_PROVIDER_SITE_OTHER): Payer: Medicare Other | Admitting: Cardiovascular Disease

## 2014-07-02 ENCOUNTER — Encounter: Payer: Self-pay | Admitting: Cardiovascular Disease

## 2014-07-02 VITALS — BP 128/71 | HR 73 | Resp 20 | Ht 68.0 in | Wt 212.6 lb

## 2014-07-02 DIAGNOSIS — R0602 Shortness of breath: Secondary | ICD-10-CM

## 2014-07-02 NOTE — Progress Notes (Signed)
Patient ID: [name], female   DOB: [birth year], 68 y.o.   MRN: [id number]      Reason for office visit Valvular heart disease (status post aortic and mitral valve replacement with a biological prostheses)   [name] is now roughly 6 months status post replacement of both her aortic valve and mitral valve for combined aortic stenosis and mitral stenosis. Surgery was performed on March 5 and she was discharged from the hospital on March 13. The aortic valve prosthesis is a 21 mm [employer] MagnaEase pericardial tissue valve and the mitral valve is a 25 mm device of the same make.   She has gradually recovered and has participated in cardiac rehabilitation, but she prefers the pulmonary rehabilitation program. She has occasional wheezing.  She continues to have dyspnea on exertion, New York Heart Association functional class II. She occasionally has ankle swelling that resolves by the morning time, especially if she skips taking furosemide. She has lost substantial weight, but has hit a plateau at 213 pounds. She is no longer anemic and today she is not tachycardic.  She underwent cardiopulmonary stress testing with Dr. [name], recently. While this confirmed the presence of some lung problems, the study suggested that the major reason for her shortness of breath his obesity and deconditioning. We also repeated her echocardiogram. She continues to have excellent left ventricular systolic function, although there was evidence of elevated filling pressure by Doppler study. The gradients across both the order valve (peak 37, mean 23) and mitral valve (peak 13, mean 6) right the higher end of normal range for these devices.   Allergies  Allergen Reactions  . Codeine Nausea Only  . Prednisone Hives    In different places all over the body.  . Sulfonamide Derivatives Nausea Only    Current Outpatient Prescriptions  Medication Sig Dispense Refill  . albuterol (PROVENTIL HFA;VENTOLIN  HFA) 108 (90 BASE) MCG/ACT inhaler Inhale 2 puffs into the lungs every 6 (six) hours as needed for wheezing.      Marland Kitchen aspirin 81 MG tablet Take 81 mg by mouth at bedtime.       Marland Kitchen BIOTIN PO Take 1 tablet by mouth daily.      Marland Kitchen BREO ELLIPTA 100-25 MCG/INH AEPB inhale 1 puff by mouth once daily  60 each  5  . Calcium Carbonate-Vitamin D (CALCIUM 600 + D PO) Take 1,200 mg by mouth at bedtime.       . furosemide (LASIX) 40 MG tablet Take 1 tablet (40 mg total) by mouth daily.  30 tablet  11  . lovastatin (MEVACOR) 40 MG tablet take 1 tablet by mouth at bedtime  30 tablet  6  . Miconazole Nitrate 2 % POWD 1 application by Does not apply route daily. Apply to stomach and under breasts      . montelukast (SINGULAIR) 10 MG tablet Take 1 tablet (10 mg total) by mouth at bedtime.  30 tablet  11  . pantoprazole (PROTONIX) 40 MG tablet Take 1 tablet (40 mg total) by mouth at bedtime.  90 tablet  2  . polyethylene glycol (MIRALAX / GLYCOLAX) packet Take 17 g by mouth daily.  14 each  0  . potassium chloride (K-DUR,KLOR-CON) 10 MEQ tablet Take 2 tablets (20 mEq total) by mouth daily.  60 tablet  11  . SPIRIVA HANDIHALER 18 MCG inhalation capsule inhale the contents of one capsule in the handihaler once daily  30 capsule  4   No current facility-administered medications  for this visit.    Past Medical History  Diagnosis Date  . IBS (irritable bowel syndrome)   . Allergic rhinitis   . COPD (chronic obstructive pulmonary disease)   . Asthma   . Lung nodule     RLL nodule-44mm stable 2006, April 2009, and June 2009  . Anemia   . Hyperlipemia   . Obesity   . Essential hypertension   . Hemorrhoids   . Sleep apnea     PT TOLD BORDERLINE-TRIED CPAP-DID NOT HELP-SHE DOES NOT USE CPAP  . Arthritis   . History of radiation therapy 05/30/11 to 07/07/11    rectum  . History of pneumonia     several times  . History of shingles   . Rectal cancer 04/2011    s/p ileostomy, s/p reversal, s/p chemo  . Severe aortic  valve stenosis  AVR 3/15 10/01/2013    mild-mod by echo 02/2014  . Rheumatic heart disease mitral stenosis     mod MS by echo 02/2014  . Anxiety   . S/P AVR (aortic valve replacement) 2015    bioprosthetic (Bartle)  . S/P MVR (mitral valve replacement) 2015    bioprosthetic (Bartle)  . DDD (degenerative disc disease)     cervical - kyphosis with mod DD changes C5/6 and C6/7; lumbar - early DD at L2/3 Ellene Route)    Past Surgical History  Procedure Laterality Date  . Foot surgery  left foot    hammer toe  . Hand surgery  Bil    carpal tunnel  . Back surgery  2006, 2007    2006 SPACER, 2007 decompression and fusion L4/5  . Tonsillectomy    . Colon resection  08/25/2011    Procedure: COLON RESECTION LAPAROSCOPIC;  Surgeon: Stark Klein, MD;  Location: [facility];  Service: General;  Laterality: N/A;  Laparoscopic Assisted Low Anterior Resection Diverting Ostomy and onQ pain pump  . Ileostomy  08/25/2011  . Ileostomy closure  04/02/2012    Procedure: ILEOSTOMY TAKEDOWN;  Surgeon: Stark Klein, MD;  Location: [facility];  Service: General;  Laterality: N/A;  . Bowel resection  04/02/2012    Procedure: SMALL BOWEL RESECTION;  Surgeon: Stark Klein, MD;  Location: [facility];  Service: General;  Laterality: N/A;  . Carpal tunnel release    . Cardiac catheterization  11/27/2013  . Tee without cardioversion N/A 11/29/2013    Procedure: TRANSESOPHAGEAL ECHOCARDIOGRAM (TEE);  Surgeon: Sueanne Margarita, MD;  Location: [place];  Service: Cardiovascular;  Laterality: N/A;  . Aortic valve replacement N/A 12/12/2013    Procedure: AORTIC VALVE REPLACEMENT (AVR);  Surgeon: Gaye Pollack, MD; Service: Open Heart Surgery  . Mitral valve replacement N/A 12/12/2013    Procedure: MITRAL VALVE (MV) REPLACEMENT OR REPAIR;  Surgeon: Gaye Pollack, MD; Service: Open Heart Surgery  . Intraoperative transesophageal echocardiogram N/A 12/12/2013    Procedure: INTRAOPERATIVE TRANSESOPHAGEAL ECHOCARDIOGRAM;  Surgeon: Gaye Pollack, MD;  Location: [facility];  Service: Open Heart Surgery;  Laterality: N/A;  . Colonoscopy  03/2013    1 polyp, rpt 3 yrs Ardis Hughs)    Family History  Problem Relation Age of Onset  . Cancer Father     lung  . Heart disease Mother   . Diabetes Mother   . Hypertension Mother   . Colon cancer Neg Hx   . Esophageal cancer Neg Hx   . Stomach cancer Neg Hx   . Rectal cancer Neg Hx     History   Social History  .  Marital Status: Married    Spouse Name: N/A    Number of Children: N/A  . Years of Education: N/A   Occupational History  . retired    Social History Main Topics  . Smoking status: Former Smoker -- 1.50 packs/day for 10 years    Types: Cigarettes    Quit date: 12/27/1974  . Smokeless tobacco: Never Used  . Alcohol Use: No     Comment: rare  . Drug Use: No  . Sexual Activity: No   Other Topics Concern  . Not on file   Social History Narrative   Married 1966   2 sons; 1 grandchild   Retired-[employer]   Doing ok   March '09-financial tough times    Review of systems: The patient specifically denies dyspnea at rest or with exertion, orthopnea, paroxysmal nocturnal dyspnea, syncope, palpitations, focal neurological deficits, intermittent claudication, lower extremity edema, unexplained weight gain, cough, hemoptysis or wheezing.  The patient also denies abdominal pain, nausea, vomiting, dysphagia, diarrhea, constipation, polyuria, polydipsia, dysuria, hematuria, frequency, urgency, abnormal bleeding or bruising, fever, chills, unexpected weight changes, mood swings, change in skin or hair texture, change in voice quality, auditory or visual problems, allergic reactions or rashes, new musculoskeletal complaints other than usual "aches and pains".   PHYSICAL EXAM BP 128/71  Pulse 73  Resp 20  Ht 5\' 8"  (1.727 m)  Wt 96.435 kg (212 lb 9.6 oz)  BMI 32.33 kg/m2 General: Alert, oriented x3, no distress  Head: no evidence of trauma, PERRL, EOMI, no exophtalmos or lid  lag, no myxedema, no xanthelasma; normal ears, nose and oropharynx  Neck: normal jugular venous pulsations and no hepatojugular reflux; brisk carotid pulses without delay and no carotid bruits  Chest: Well-healed sternotomy scar clear to auscultation, no signs of consolidation by percussion or palpation, normal fremitus, symmetrical and full respiratory excursions  Cardiovascular: normal position and quality of the apical impulse, regular rhythm, normal first and second heart sounds, or prosthetic valve clicks, grade 2-5/3 systolic ejection murmur at the aortic focus is early peaking, no diastolic murmurs, rubs or gallops  Abdomen: no tenderness or distention, no masses by palpation, no abnormal pulsatility or arterial bruits, normal bowel sounds, no hepatosplenomegaly  Extremities: no clubbing, cyanosis or edema; 2+ radial, ulnar and brachial pulses bilaterally; 2+ right femoral, posterior tibial and dorsalis pedis pulses; 2+ left femoral, posterior tibial and dorsalis pedis pulses; no subclavian or femoral bruits  Neurological: grossly nonfocal.   EKG: Sinus rhythm, first degree AV block, right bundle branch block, left anterior fascicular block (trifascicular block)  Lipid Panel     Component Value Date/Time   CHOL 181 07/05/2013 0850   TRIG 117 07/05/2013 0850   HDL 51 07/05/2013 0850   CHOLHDL 3.5 07/05/2013 0850   VLDL 23 07/05/2013 0850   LDLCALC 107* 07/05/2013 0850    BMET    Component Value Date/Time   NA 141 05/12/2014 0947   NA 141 07/23/2013 1303   NA 142 03/15/2012 0923   K 4.6 05/12/2014 0947   K 4.5 07/23/2013 1303   K 4.6 03/15/2012 0923   CL 100 05/12/2014 0947   CL 104 11/15/2012 0908   CL 103 03/15/2012 0923   CO2 27 05/12/2014 0947   CO2 26 07/23/2013 1303   CO2 29 03/15/2012 0923   GLUCOSE 84 05/12/2014 0947   GLUCOSE 81 07/23/2013 1303   GLUCOSE 93 11/15/2012 0908   GLUCOSE 105 03/15/2012 0923   BUN 24* 05/12/2014 0947   BUN  22.6 07/23/2013 1303   BUN 18 03/15/2012 0923   CREATININE  1.21* 05/12/2014 0947   CREATININE 1.10 12/21/2013 0513   CREATININE 0.9 07/23/2013 1303   CALCIUM 9.3 05/12/2014 0947   CALCIUM 9.4 07/23/2013 1303   CALCIUM 9.0 03/15/2012 0923   GFRNONAA 50* 12/21/2013 0513   GFRAA 58* 12/21/2013 0513     ASSESSMENT AND PLAN  I think Shalaunda has some improvement after resolution of her postoperative anemia. I encourage her to continue participation in cardiopulmonary rehabilitation. While there is some evidence of very slight prosthesis valve mismatch, most of the problem appears to be deconditioning and obesity. She should continue taking furosemide since there was some evidence of elevated filling pressures by echo. I see little point in taking the diltiazem now. Her blood pressure is completely normal and she has not had atrial arrhythmia. In fact the diltiazem may lead to worsening AV conduction and precipitate the need for implantation of a pacemaker.  Orders Placed This Encounter  Procedures  . EKG 12-Lead   No orders of the defined types were placed in this encounter.    Holli Humbles, MD, [place] [phone] office [phone] pager

## 2014-07-02 NOTE — Patient Instructions (Signed)
STOP Diltiazem.  Dr. Sallyanne Kuster recommends that you schedule a follow-up appointment in: 6 months.

## 2014-07-06 DIAGNOSIS — R0602 Shortness of breath: Secondary | ICD-10-CM

## 2014-07-10 ENCOUNTER — Telehealth: Payer: Self-pay | Admitting: *Deleted

## 2014-07-10 ENCOUNTER — Other Ambulatory Visit (HOSPITAL_BASED_OUTPATIENT_CLINIC_OR_DEPARTMENT_OTHER): Payer: Medicare Other

## 2014-07-10 DIAGNOSIS — C20 Malignant neoplasm of rectum: Secondary | ICD-10-CM

## 2014-07-10 LAB — CEA: CEA: <0.5 ng/mL (ref 0.0–5.0)

## 2014-07-10 NOTE — Telephone Encounter (Signed)
Left VM requesting MD "release" her CEA results today.

## 2014-07-11 ENCOUNTER — Ambulatory Visit: Payer: Medicare Other | Admitting: Oncology

## 2014-07-14 ENCOUNTER — Telehealth: Payer: Self-pay | Admitting: Oncology

## 2014-07-14 ENCOUNTER — Ambulatory Visit (HOSPITAL_BASED_OUTPATIENT_CLINIC_OR_DEPARTMENT_OTHER): Payer: Medicare Other | Admitting: Oncology

## 2014-07-14 VITALS — BP 101/55 | HR 99 | Temp 97.4°F | Resp 18 | Ht 68.0 in | Wt 215.0 lb

## 2014-07-14 DIAGNOSIS — Z85048 Personal history of other malignant neoplasm of rectum, rectosigmoid junction, and anus: Secondary | ICD-10-CM

## 2014-07-14 DIAGNOSIS — C20 Malignant neoplasm of rectum: Secondary | ICD-10-CM

## 2014-07-14 NOTE — Telephone Encounter (Signed)
gv and printed appt sched and avs for pt for OCT and April 2016...Marland Kitchenpt wants water based

## 2014-07-14 NOTE — Progress Notes (Signed)
  [facility] OFFICE PROGRESS NOTE   Diagnosis: Rectal cancer  INTERVAL HISTORY:   Mr. Hitson returns as scheduled. She feels well. She continues to recover from heart valve replacement surgery. She has exertional dyspnea.   Objective:  Vital signs in last 24 hours:  Blood pressure 101/55, pulse 99, temperature 97.4 F (36.3 C), temperature source Oral, resp. rate 18, height 5\' 8"  (1.727 m), weight 215 lb (97.523 kg).    HEENT: Neck without mass Lymphatics: No cervical, supra-clavicular, axillary, or inguinal nodes Resp: Scattered inspiratory and expiratory rhonchi and wheezes, no respiratory distress Cardio: Regular rate and rhythm GI: No hepatomegaly, nontender, no mass Vascular: No leg edema  Skin: Yeast rash in the left greater than right groin   Lab Results  Component Value Date   CEA <0.5 07/10/2014    Medications: I have reviewed the patient's current medications.  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 Ileostomy takedown 04/02/2012  3. COPD  4. admission with a partial small bowel obstruction November 2014, resolved with bowel rest  5. aortic and mitral valve replacements 12/12/2013    Disposition:  Ms. Kaczmarek remains in clinical remission from rectal cancer. She will return for an office visit and CEA in 6 months.  We discussed the indication for a final surveillance CT scan. She  indicates she would like to be considered for a metastectomy procedure if she were found to have oligometastatic disease. She will be scheduled for a creatinine and CTs of the chest, abdomen, and pelvis within the next month.  [name], MD  07/14/2014  4:07 PM

## 2014-07-23 ENCOUNTER — Other Ambulatory Visit (HOSPITAL_BASED_OUTPATIENT_CLINIC_OR_DEPARTMENT_OTHER): Payer: Medicare Other

## 2014-07-23 ENCOUNTER — Telehealth: Payer: Self-pay | Admitting: *Deleted

## 2014-07-23 ENCOUNTER — Ambulatory Visit (HOSPITAL_COMMUNITY)
Admission: RE | Admit: 2014-07-23 | Discharge: 2014-07-23 | Disposition: A | Discharge status: Home / Self Care | Payer: Medicare Other | Source: Ambulatory Visit | Attending: Oncology | Admitting: Oncology

## 2014-07-23 ENCOUNTER — Encounter (HOSPITAL_COMMUNITY): Payer: Self-pay

## 2014-07-23 DIAGNOSIS — C20 Malignant neoplasm of rectum: Secondary | ICD-10-CM

## 2014-07-23 DIAGNOSIS — Z923 Personal history of irradiation: Secondary | ICD-10-CM | POA: Insufficient documentation

## 2014-07-23 DIAGNOSIS — Z85048 Personal history of other malignant neoplasm of rectum, rectosigmoid junction, and anus: Secondary | ICD-10-CM

## 2014-07-23 LAB — BASIC METABOLIC PANEL (CC13)
Anion Gap: 13 mEq/L — ABNORMAL HIGH (ref 3–11)
BUN: 20.8 mg/dL (ref 7.0–26.0)
CO2: 26 mEq/L (ref 22–29)
Calcium: 9.9 mg/dL (ref 8.4–10.4)
Chloride: 103 mEq/L (ref 98–109)
Creatinine: 1.2 mg/dL — ABNORMAL HIGH (ref 0.6–1.1)
Glucose: 98 mg/dl (ref 70–140)
Potassium: 3.6 mEq/L (ref 3.5–5.1)
Sodium: 141 mEq/L (ref 136–145)

## 2014-07-23 MED ORDER — IOHEXOL 300 MG/ML  SOLN
100.0000 mL | Freq: Once | INTRAMUSCULAR | Status: AC | PRN
  Administered 2014-07-23: 100 mL via INTRAVENOUS

## 2014-07-23 MED ORDER — IOHEXOL 300 MG/ML  SOLN
50.0000 mL | Freq: Once | INTRAMUSCULAR | Status: AC | PRN
  Administered 2014-07-23: 50 mL via ORAL

## 2014-07-23 NOTE — Telephone Encounter (Signed)
Per Dr. Benay Spice; notified pt that CT scan showed no evidence of metastatic disease.  Pt very happy; verbalized understanding and confirmed appt for April 2016.

## 2014-07-25 ENCOUNTER — Telehealth: Payer: Self-pay | Admitting: *Deleted

## 2014-07-25 DIAGNOSIS — C20 Malignant neoplasm of rectum: Secondary | ICD-10-CM

## 2014-07-25 NOTE — Telephone Encounter (Signed)
Patient called back worried that the cancer is causing her elevation in serum creatinine. Assured her it is not the cancer causing the elevation. Told her that our renal function can diminish with aging, thus the contrast does not clear as quickly. Waiting a week to recheck is adequate and she should not worry. She understands and agrees.

## 2014-07-25 NOTE — Telephone Encounter (Signed)
Per Dr. Benay Spice; notified pt that creatinine mildly elevated and could be from contrast; would like to re-check lab next week.  Pt verbalized understanding and confirmed she can be her Monday at 8:45 for lab.  Pt next PCP appt in Jan/2016.

## 2014-07-25 NOTE — Telephone Encounter (Signed)
Message copied by Domenic Schwab on Fri Jul 25, 2014 12:21 PM ------      Message from: [name] B      Created: Thu Jul 24, 2014  8:23 AM       Please call patient, creatinine mildly elevated and received contrast, check bmet here or with primary md next 1 week ------

## 2014-07-28 ENCOUNTER — Other Ambulatory Visit (HOSPITAL_BASED_OUTPATIENT_CLINIC_OR_DEPARTMENT_OTHER): Payer: Medicare Other

## 2014-07-28 DIAGNOSIS — C20 Malignant neoplasm of rectum: Secondary | ICD-10-CM

## 2014-07-28 DIAGNOSIS — Z85048 Personal history of other malignant neoplasm of rectum, rectosigmoid junction, and anus: Secondary | ICD-10-CM

## 2014-07-28 LAB — BASIC METABOLIC PANEL (CC13)
Anion Gap: 10 mEq/L (ref 3–11)
BUN: 19.7 mg/dL (ref 7.0–26.0)
CO2: 30 mEq/L — ABNORMAL HIGH (ref 22–29)
Calcium: 10 mg/dL (ref 8.4–10.4)
Chloride: 103 mEq/L (ref 98–109)
Creatinine: 1.2 mg/dL — ABNORMAL HIGH (ref 0.6–1.1)
Glucose: 96 mg/dl (ref 70–140)
Potassium: 3.6 mEq/L (ref 3.5–5.1)
Sodium: 143 mEq/L (ref 136–145)

## 2014-07-29 ENCOUNTER — Telehealth: Payer: Self-pay

## 2014-07-29 NOTE — Telephone Encounter (Signed)
Message copied by Bevelyn Ngo on Tue Jul 29, 2014  9:07 AM ------      Message from: Tania Ade      Created: Mon Jul 28, 2014  4:05 PM                   ----- Message -----         From: Ladell Pier, MD         Sent: 07/28/2014   3:46 PM           To: Tania Ade, RN, Ludwig Lean, RN, #            Please call patient, creatinine is stable ------

## 2014-07-29 NOTE — Telephone Encounter (Signed)
Called and informed pt Creatinine level was stable at 1.2, pt verbalized understanding. States she has an appt with her PCP in December and she will follow up with him then. Informed pt to call back with any questions or concerns.

## 2014-08-05 ENCOUNTER — Ambulatory Visit (HOSPITAL_COMMUNITY): Payer: Medicare Other

## 2014-08-22 ENCOUNTER — Encounter: Payer: Self-pay | Admitting: Family Medicine

## 2014-08-25 ENCOUNTER — Emergency Department (HOSPITAL_COMMUNITY): Payer: Medicare Other

## 2014-08-25 ENCOUNTER — Encounter (HOSPITAL_COMMUNITY): Payer: Self-pay

## 2014-08-25 ENCOUNTER — Observation Stay (HOSPITAL_COMMUNITY)
Admission: EM | Admit: 2014-08-25 | Discharge: 2014-08-27 | Disposition: A | Discharge status: Home / Self Care | Payer: Medicare Other | Attending: Cardiovascular Disease | Admitting: Cardiovascular Disease

## 2014-08-25 DIAGNOSIS — Z85048 Personal history of other malignant neoplasm of rectum, rectosigmoid junction, and anus: Secondary | ICD-10-CM | POA: Diagnosis present

## 2014-08-25 DIAGNOSIS — J449 Chronic obstructive pulmonary disease, unspecified: Secondary | ICD-10-CM | POA: Insufficient documentation

## 2014-08-25 DIAGNOSIS — I10 Essential (primary) hypertension: Secondary | ICD-10-CM | POA: Diagnosis not present

## 2014-08-25 DIAGNOSIS — Z8619 Personal history of other infectious and parasitic diseases: Secondary | ICD-10-CM | POA: Insufficient documentation

## 2014-08-25 DIAGNOSIS — R079 Chest pain, unspecified: Secondary | ICD-10-CM | POA: Diagnosis present

## 2014-08-25 DIAGNOSIS — Z6823 Body mass index (BMI) 23.0-23.9, adult: Secondary | ICD-10-CM | POA: Diagnosis present

## 2014-08-25 DIAGNOSIS — I270 Primary pulmonary hypertension: Secondary | ICD-10-CM

## 2014-08-25 DIAGNOSIS — Z952 Presence of prosthetic heart valve: Secondary | ICD-10-CM

## 2014-08-25 DIAGNOSIS — Z79899 Other long term (current) drug therapy: Secondary | ICD-10-CM | POA: Diagnosis not present

## 2014-08-25 DIAGNOSIS — C20 Malignant neoplasm of rectum: Secondary | ICD-10-CM | POA: Diagnosis present

## 2014-08-25 DIAGNOSIS — I350 Nonrheumatic aortic (valve) stenosis: Secondary | ICD-10-CM | POA: Diagnosis not present

## 2014-08-25 DIAGNOSIS — M1990 Unspecified osteoarthritis, unspecified site: Secondary | ICD-10-CM | POA: Diagnosis not present

## 2014-08-25 DIAGNOSIS — Z7982 Long term (current) use of aspirin: Secondary | ICD-10-CM | POA: Insufficient documentation

## 2014-08-25 DIAGNOSIS — Z8701 Personal history of pneumonia (recurrent): Secondary | ICD-10-CM | POA: Diagnosis not present

## 2014-08-25 DIAGNOSIS — R0600 Dyspnea, unspecified: Principal | ICD-10-CM | POA: Insufficient documentation

## 2014-08-25 DIAGNOSIS — E785 Hyperlipidemia, unspecified: Secondary | ICD-10-CM | POA: Insufficient documentation

## 2014-08-25 DIAGNOSIS — E669 Obesity, unspecified: Secondary | ICD-10-CM | POA: Diagnosis not present

## 2014-08-25 DIAGNOSIS — Z6822 Body mass index (BMI) 22.0-22.9, adult: Secondary | ICD-10-CM | POA: Diagnosis present

## 2014-08-25 DIAGNOSIS — Z6825 Body mass index (BMI) 25.0-25.9, adult: Secondary | ICD-10-CM

## 2014-08-25 DIAGNOSIS — K589 Irritable bowel syndrome without diarrhea: Secondary | ICD-10-CM | POA: Insufficient documentation

## 2014-08-25 DIAGNOSIS — Z923 Personal history of irradiation: Secondary | ICD-10-CM | POA: Diagnosis not present

## 2014-08-25 DIAGNOSIS — Z6824 Body mass index (BMI) 24.0-24.9, adult: Secondary | ICD-10-CM | POA: Diagnosis present

## 2014-08-25 DIAGNOSIS — I099 Rheumatic heart disease, unspecified: Secondary | ICD-10-CM | POA: Diagnosis not present

## 2014-08-25 DIAGNOSIS — F419 Anxiety disorder, unspecified: Secondary | ICD-10-CM | POA: Diagnosis not present

## 2014-08-25 DIAGNOSIS — D649 Anemia, unspecified: Secondary | ICD-10-CM | POA: Diagnosis not present

## 2014-08-25 DIAGNOSIS — E663 Overweight: Secondary | ICD-10-CM | POA: Diagnosis present

## 2014-08-25 DIAGNOSIS — Z954 Presence of other heart-valve replacement: Secondary | ICD-10-CM | POA: Insufficient documentation

## 2014-08-25 DIAGNOSIS — Z87891 Personal history of nicotine dependence: Secondary | ICD-10-CM | POA: Insufficient documentation

## 2014-08-25 HISTORY — DX: Chest pain, unspecified: R07.9

## 2014-08-25 LAB — CBC
HCT: 38.8 % (ref 36.0–46.0)
Hemoglobin: 12.4 g/dL (ref 12.0–15.0)
MCH: 28 pg (ref 26.0–34.0)
MCHC: 32 g/dL (ref 30.0–36.0)
MCV: 87.6 fL (ref 78.0–100.0)
Platelets: 182 10*3/uL (ref 150–400)
RBC: 4.43 MIL/uL (ref 3.87–5.11)
RDW: 14.7 % (ref 11.5–15.5)
WBC: 8.5 10*3/uL (ref 4.0–10.5)

## 2014-08-25 LAB — I-STAT TROPONIN, ED
Troponin i, poc: 0.06 ng/mL (ref 0.00–0.08)
Troponin i, poc: 0.07 ng/mL (ref 0.00–0.08)

## 2014-08-25 LAB — BASIC METABOLIC PANEL
Anion gap: 12 (ref 5–15)
BUN: 23 mg/dL (ref 6–23)
CO2: 27 mEq/L (ref 19–32)
Calcium: 10 mg/dL (ref 8.4–10.5)
Chloride: 102 mEq/L (ref 96–112)
Creatinine, Ser: 1.15 mg/dL — ABNORMAL HIGH (ref 0.50–1.10)
GFR calc Af Amer: 55 mL/min — ABNORMAL LOW (ref >90)
GFR calc non Af Amer: 47 mL/min — ABNORMAL LOW (ref >90)
Glucose, Bld: 96 mg/dL (ref 70–99)
Potassium: 4.2 mEq/L (ref 3.7–5.3)
Sodium: 141 mEq/L (ref 137–147)

## 2014-08-25 LAB — PRO B NATRIURETIC PEPTIDE: Pro B Natriuretic peptide (BNP): 666.3 pg/mL — ABNORMAL HIGH (ref 0–125)

## 2014-08-25 LAB — SEDIMENTATION RATE: Sed Rate: 30 mm/hr — ABNORMAL HIGH (ref 0–22)

## 2014-08-25 LAB — CBG MONITORING, ED: Glucose-Capillary: 125 mg/dL — ABNORMAL HIGH (ref 70–99)

## 2014-08-25 MED ORDER — IOHEXOL 350 MG/ML SOLN: 100.0000 mL | Freq: Once | INTRAVENOUS | Status: AC | PRN

## 2014-08-25 MED ORDER — HEPARIN SODIUM (PORCINE) 5000 UNIT/ML IJ SOLN
5000.0000 [IU] | Freq: Three times a day (TID) | INTRAMUSCULAR | Status: DC
  Administered 2014-08-25 – 2014-08-27 (×5): 5000 [IU] via SUBCUTANEOUS
  Filled 2014-08-25 (×4): qty 1

## 2014-08-25 MED ORDER — MICONAZOLE NITRATE POWD: Freq: Every day | Status: DC | PRN

## 2014-08-25 MED ORDER — MORPHINE SULFATE 4 MG/ML IJ SOLN
4.0000 mg | INTRAMUSCULAR | Status: DC | PRN
  Administered 2014-08-26: 4 mg via INTRAVENOUS
  Filled 2014-08-25: qty 1

## 2014-08-25 MED ORDER — ACETAMINOPHEN 325 MG PO TABS
650.0000 mg | ORAL_TABLET | ORAL | Status: DC | PRN
  Administered 2014-08-26 – 2014-08-27 (×2): 650 mg via ORAL
  Filled 2014-08-25 (×2): qty 2

## 2014-08-25 MED ORDER — MICONAZOLE NITRATE 2 % POWD: 1.0000 "application " | Freq: Every day | Status: DC | PRN

## 2014-08-25 MED ORDER — GI COCKTAIL ~~LOC~~
30.0000 mL | Freq: Four times a day (QID) | ORAL | Status: DC | PRN
  Administered 2014-08-26: 30 mL via ORAL
  Filled 2014-08-25: qty 30

## 2014-08-25 MED ORDER — ONDANSETRON HCL 4 MG/2ML IJ SOLN
4.0000 mg | Freq: Four times a day (QID) | INTRAMUSCULAR | Status: DC | PRN
  Administered 2014-08-26: 4 mg via INTRAVENOUS
  Filled 2014-08-25: qty 2

## 2014-08-25 MED ORDER — PANTOPRAZOLE SODIUM 40 MG PO TBEC
40.0000 mg | DELAYED_RELEASE_TABLET | Freq: Every day | ORAL | Status: DC
  Administered 2014-08-25 – 2014-08-26 (×2): 40 mg via ORAL
  Filled 2014-08-25 (×2): qty 1

## 2014-08-25 MED ORDER — ONDANSETRON HCL 4 MG/2ML IJ SOLN
4.0000 mg | Freq: Once | INTRAMUSCULAR | Status: AC
  Administered 2014-08-25: 4 mg via INTRAVENOUS
  Filled 2014-08-25: qty 2

## 2014-08-25 MED ORDER — MORPHINE SULFATE 4 MG/ML IJ SOLN
4.0000 mg | Freq: Once | INTRAMUSCULAR | Status: AC
  Administered 2014-08-25: 4 mg via INTRAVENOUS
  Filled 2014-08-25: qty 1

## 2014-08-25 MED ORDER — PRAVASTATIN SODIUM 40 MG PO TABS
40.0000 mg | ORAL_TABLET | Freq: Every day | ORAL | Status: DC
  Administered 2014-08-25 – 2014-08-26 (×2): 40 mg via ORAL
  Filled 2014-08-25 (×2): qty 1

## 2014-08-25 MED ORDER — MONTELUKAST SODIUM 10 MG PO TABS
10.0000 mg | ORAL_TABLET | Freq: Every day | ORAL | Status: DC
  Administered 2014-08-25 – 2014-08-26 (×2): 10 mg via ORAL
  Filled 2014-08-25 (×2): qty 1

## 2014-08-25 MED ORDER — ALBUTEROL SULFATE (2.5 MG/3ML) 0.083% IN NEBU: 3.0000 mL | INHALATION_SOLUTION | Freq: Four times a day (QID) | RESPIRATORY_TRACT | Status: DC | PRN

## 2014-08-25 MED ORDER — ASPIRIN 81 MG PO TABS: 81.0000 mg | ORAL_TABLET | Freq: Every day | ORAL | Status: DC

## 2014-08-25 MED ORDER — NITROGLYCERIN 0.4 MG SL SUBL
0.4000 mg | SUBLINGUAL_TABLET | SUBLINGUAL | Status: AC | PRN
  Administered 2014-08-25: 0.4 mg via SUBLINGUAL
  Filled 2014-08-25: qty 1

## 2014-08-25 MED ORDER — SODIUM CHLORIDE 0.9 % IV BOLUS (SEPSIS)
500.0000 mL | Freq: Once | INTRAVENOUS | Status: AC
  Administered 2014-08-25: 500 mL via INTRAVENOUS

## 2014-08-25 MED ORDER — ASPIRIN EC 81 MG PO TBEC
81.0000 mg | DELAYED_RELEASE_TABLET | Freq: Every day | ORAL | Status: DC
  Administered 2014-08-26 – 2014-08-27 (×2): 81 mg via ORAL
  Filled 2014-08-25 (×2): qty 1

## 2014-08-25 MED ORDER — POTASSIUM CHLORIDE CRYS ER 20 MEQ PO TBCR
20.0000 meq | EXTENDED_RELEASE_TABLET | Freq: Every day | ORAL | Status: DC
  Administered 2014-08-27: 20 meq via ORAL
  Filled 2014-08-25: qty 1

## 2014-08-25 MED ORDER — FUROSEMIDE 40 MG PO TABS
40.0000 mg | ORAL_TABLET | Freq: Every day | ORAL | Status: DC
  Administered 2014-08-27: 40 mg via ORAL
  Filled 2014-08-25: qty 1

## 2014-08-25 MED ORDER — POLYETHYLENE GLYCOL 3350 17 G PO PACK
17.0000 g | PACK | Freq: Every day | ORAL | Status: DC
  Filled 2014-08-25: qty 1

## 2014-08-25 MED ORDER — ASPIRIN 81 MG PO CHEW
324.0000 mg | CHEWABLE_TABLET | Freq: Once | ORAL | Status: AC
  Administered 2014-08-25: 324 mg via ORAL
  Filled 2014-08-25: qty 4

## 2014-08-25 MED ORDER — TIOTROPIUM BROMIDE MONOHYDRATE 18 MCG IN CAPS
18.0000 ug | ORAL_CAPSULE | Freq: Every day | RESPIRATORY_TRACT | Status: DC
  Administered 2014-08-27: 18 ug via RESPIRATORY_TRACT
  Filled 2014-08-25 (×2): qty 5

## 2014-08-25 NOTE — ED Provider Notes (Signed)
CSN: [id number]     Arrival date & time 08/25/14  1245 History   None    Chief Complaint  Patient presents with  . Chest Pain     (Consider location/radiation/quality/duration/timing/severity/associated sxs/prior Treatment) Patient is a 68 y.o. female presenting with chest pain. The history is provided by the patient. No language interpreter was used.  Chest Pain Pain location:  Substernal area Pain quality: sharp   Pain radiates to:  Upper back Pain radiates to the back: yes   Pain severity:  Severe Onset quality:  Sudden Duration:  5 hours Timing:  Intermittent (lasting 5 minutes or so) Progression:  Waxing and waning Chronicity:  New Context: at rest   Relieved by:  Leaning forward Worsened by:  Nothing tried Ineffective treatments:  None tried Associated symptoms: no abdominal pain, no altered mental status, no cough, no fever, no headache, no nausea, no orthopnea, no PND, no shortness of breath, no syncope and not vomiting   Risk factors: diabetes mellitus, high cholesterol and hypertension   Risk factors: no prior DVT/PE   Risk factors comment:  Aortic valve and Mitral valve replacement   Past Medical History  Diagnosis Date  . IBS (irritable bowel syndrome)   . Allergic rhinitis   . COPD (chronic obstructive pulmonary disease)   . Asthma   . Lung nodule     RLL nodule-72mm stable 2006, April 2009, and June 2009  . Anemia   . Hyperlipemia   . Obesity   . Essential hypertension   . Hemorrhoids   . Sleep apnea     PT TOLD BORDERLINE-TRIED CPAP-DID NOT HELP-SHE DOES NOT USE CPAP  . Arthritis   . History of radiation therapy 05/30/11 to 07/07/11    rectum  . History of pneumonia     several times  . History of shingles   . Rectal cancer 04/2011    T3N0; s/p lap LAR, s/p ileostomy, s/p reversal, s/p chemo  . Severe aortic valve stenosis  AVR 3/15 10/01/2013    mild-mod by echo 02/2014  . Rheumatic heart disease mitral stenosis     mod MS by echo 02/2014  .  Anxiety   . S/P AVR (aortic valve replacement) 2015    bioprosthetic (Bartle)  . S/P MVR (mitral valve replacement) 2015    bioprosthetic (Bartle)  . DDD (degenerative disc disease)     cervical - kyphosis with mod DD changes C5/6 and C6/7; lumbar - early DD at L2/3 Ellene Route)   Past Surgical History  Procedure Laterality Date  . Foot surgery  left foot    hammer toe  . Hand surgery  Bil    carpal tunnel  . Back surgery  2006, 2007    2006 SPACER, 2007 decompression and fusion L4/5  . Tonsillectomy    . Colon resection  08/25/2011    Procedure: COLON RESECTION LAPAROSCOPIC;  Surgeon: Stark Klein, MD;  Location: [facility];  Service: General;  Laterality: N/A;  Laparoscopic Assisted Low Anterior Resection Diverting Ostomy and onQ pain pump  . Ileostomy  08/25/2011  . Ileostomy closure  04/02/2012    Procedure: ILEOSTOMY TAKEDOWN;  Surgeon: Stark Klein, MD;  Location: [facility];  Service: General;  Laterality: N/A;  . Bowel resection  04/02/2012    Procedure: SMALL BOWEL RESECTION;  Surgeon: Stark Klein, MD;  Location: [facility];  Service: General;  Laterality: N/A;  . Carpal tunnel release    . Cardiac catheterization  11/27/2013  . Tee without cardioversion N/A 11/29/2013  Procedure: TRANSESOPHAGEAL ECHOCARDIOGRAM (TEE);  Surgeon: Sueanne Margarita, MD;  Location: [facility];  Service: Cardiovascular;  Laterality: N/A;  . Aortic valve replacement N/A 12/12/2013    Procedure: AORTIC VALVE REPLACEMENT (AVR);  Surgeon: Gaye Pollack, MD; Service: Open Heart Surgery  . Mitral valve replacement N/A 12/12/2013    Procedure: MITRAL VALVE (MV) REPLACEMENT OR REPAIR;  Surgeon: Gaye Pollack, MD; Service: Open Heart Surgery  . Intraoperative transesophageal echocardiogram N/A 12/12/2013    Procedure: INTRAOPERATIVE TRANSESOPHAGEAL ECHOCARDIOGRAM;  Surgeon: Gaye Pollack, MD;  Location: [facility];  Service: Open Heart Surgery;  Laterality: N/A;  . Colonoscopy  03/2013    1 polyp, rpt 3 yrs Ardis Hughs)   Family  History  Problem Relation Age of Onset  . Cancer Father     lung  . Heart disease Mother   . Diabetes Mother   . Hypertension Mother   . Colon cancer Neg Hx   . Esophageal cancer Neg Hx   . Stomach cancer Neg Hx   . Rectal cancer Neg Hx    History  Substance Use Topics  . Smoking status: Former Smoker -- 1.50 packs/day for 10 years    Types: Cigarettes    Quit date: 12/27/1974  . Smokeless tobacco: Never Used  . Alcohol Use: No     Comment: rare   OB History    No data available     Review of Systems  Constitutional: Negative for fever.  HENT: Negative for congestion, rhinorrhea and sore throat.   Respiratory: Negative for cough and shortness of breath.   Cardiovascular: Positive for chest pain. Negative for orthopnea, syncope and PND.  Gastrointestinal: Negative for nausea, vomiting, abdominal pain and diarrhea.  Genitourinary: Negative for dysuria and hematuria.  Skin: Negative for rash.  Neurological: Negative for syncope, light-headedness and headaches.  All other systems reviewed and are negative.     Allergies  Codeine; Prednisone; and Sulfonamide derivatives  Home Medications   Prior to Admission medications   Medication Sig Start Date End Date Taking? Authorizing Provider  albuterol (PROVENTIL HFA;VENTOLIN HFA) 108 (90 BASE) MCG/ACT inhaler Inhale 2 puffs into the lungs every 6 (six) hours as needed for wheezing.    Historical Provider, MD  aspirin 81 MG tablet Take 81 mg by mouth at bedtime.     Historical Provider, MD  BIOTIN PO Take 1 tablet by mouth daily.    Historical Provider, MD  BREO ELLIPTA 100-25 MCG/INH AEPB inhale 1 puff by mouth once daily 04/30/14   Brand Males, MD  Calcium Carbonate-Vitamin D (CALCIUM 600 + D PO) Take 1,200 mg by mouth at bedtime.     Historical Provider, MD  furosemide (LASIX) 40 MG tablet Take 1 tablet (40 mg total) by mouth daily. 05/12/14   Mihai Croitoru, MD  lovastatin (MEVACOR) 40 MG tablet take 1 tablet by mouth at  bedtime    Ria Bush, MD  Miconazole Nitrate 2 % POWD 1 application by Does not apply route daily. Apply to stomach and under breasts    Historical Provider, MD  montelukast (SINGULAIR) 10 MG tablet Take 1 tablet (10 mg total) by mouth at bedtime. 01/31/14   Brand Males, MD  pantoprazole (PROTONIX) 40 MG tablet Take 1 tablet (40 mg total) by mouth at bedtime. 04/08/14   Mihai Croitoru, MD  polyethylene glycol (MIRALAX / GLYCOLAX) packet Take 17 g by mouth daily. 12/02/13   Brett Canales, PA-C  potassium chloride (K-DUR,KLOR-CON) 10 MEQ tablet Take 2 tablets (20  mEq total) by mouth daily. 05/12/14   Mihai Croitoru, MD  SPIRIVA HANDIHALER 18 MCG inhalation capsule inhale the contents of one capsule in the handihaler once daily    Brand Males, MD   BP 130/70 mmHg  Pulse 83  Temp(Src) 98 F (36.7 C) (Oral)  Resp 18  SpO2 96% Physical Exam  ED Course  Procedures (including critical care time) Labs Review Labs Reviewed  CBC  BASIC METABOLIC PANEL  I-STAT [place], ED    Imaging Review Dg Chest 2 View  08/25/2014   CLINICAL DATA:  Chest pain, prior heart valve replacement  EXAM: CHEST  2 VIEW  COMPARISON:  Radiograph 01/08/2014, CT 07/23/2014  FINDINGS: Sternotomy wires overlie normal cardiac silhouette. Lungs are hyperinflated. No effusion, infiltrate, pneumothorax. Chronic bronchitic markings are noted.  There is a nodule in the right upper lobe not changed recent CT.  IMPRESSION: 1.  No acute cardiopulmonary process. 2. Hyperinflated lungs and chronic bronchitic markings. 3. Stable right upper lobe nodule compared to recent CT.   Electronically Signed   By: Suzy Bouchard [name]   On: 08/25/2014 13:50     EKG Interpretation   Date/Time:  Monday August 25 2014 13:09:42 EST Ventricular Rate:  86 PR Interval:  226 QRS Duration: 166 QT Interval:  438 QTC Calculation: 524 R Axis:   -148 Text Interpretation:  Sinus rhythm with 1st degree A-V block Right bundle  branch  block Abnormal ECG similar to previous Confirmed by ZAVITZ  MD,  JOSHUA ([id number]) on 08/25/2014 3:32:36 PM      MDM   Final diagnoses:  Chest pain    3:01 PM Pt is a 68 y.o. female with pertinent PMHX of HTN, HLD, rheumatic heart disease s/p MV/AV replacement who presents to the ED with chest pain. Sudden onset of substernal chest pain with radiation into back. No shortness of breath. No nausea, vomiting or diarrhea. No fevers or recent illness. No previous DVT or PE.denies new orthopnea, baseline has orthopnea. No PND. CT recently for rectal cancer: normal caliber of aorta without evidence of aneurysm. No syncope. No urinary symptoms. Sharp pain that is better with leaning forward, no worsening factors  On exam: well appearing. No abdominal tenderness. No friction rubs. Clear lung sounds. BP R 155/78 L 161/60. Normal distal pulses. Concern for dissection versus possible pericarditis. Plan for CT angio of the chest aortic dissection. Delta troponin: given clean cath 11/2013. BM  EKG personally reviewed by myself showed 1st degree AV block, RBBB Rate of 86, PR 242ms, QRS 198ms QT/QTC 438/567ms, left axis, without evidence of new ischemia. Comparison showed similar, indication: chest pain  CXR Pa/Lat:  No evidence of widened mediastinum, no vascular congestion  Study: Limited Ultrasound of the heart and pericardium.   Indications: Chest pain  Performed and interpreted by: Myself at the bedside  Images Are archived.  View used: Subxiphoid, Parasternal Long, Parasternal short, apical  Findings:  No pericardial effusion, EF 50-55%  Study limited by:  Pain, body habitus  Interpretation: no pericardial effusion  Comments: screening for pericardial effusion  CPT Code: [phone] (limited transthoracic cardiac)  Review of labs istat troponin: 0.06 CBc: no leukocytosis, H&H 12.4/38.8  BMP: cr 1.15, GFR 47 BNP: 666.3 ESR 30 CRP: pending  CTA angio chest aortic dissection: no evidence of  dissection. Small right pleural effusion. No pericardial effusion  Continues to have pain intermittently. Concerning for possible pericarditis given history. No acute findings on EKG.  Discussed with cardiology, plan for admission for continued  chest pain.  Labs, EKG and imaging reviewed by myself and considered in medical decision making if ordered.  Imaging interpreted by radiology. Pt was discussed with my attending, Dr. Walden Field, MD 08/26/14 [place], MD 08/27/14 [phone]

## 2014-08-25 NOTE — ED Notes (Signed)
Pt with chest pain that started about 2 hours ago.  Sts she was working upstairs when pain started.  Pt with recent cardiac hx in March.  Pt sts pain is substernal and radiates to back.  Pain 6/10.  Pt admits to shortness of breath; denies nv, weakness, diaphoresis.

## 2014-08-25 NOTE — ED Notes (Signed)
Gave pt ice chips with MD Jacelyn Grip approval.

## 2014-08-25 NOTE — ED Notes (Signed)
Patient transported to CT 

## 2014-08-25 NOTE — ED Notes (Signed)
Attempted to draw pts labs twice. Was unsuccessful called phlebotomy to draw pts labs

## 2014-08-25 NOTE — ED Notes (Signed)
MD Zavitz at bedside  

## 2014-08-25 NOTE — H&P (Signed)
CARDIOLOGY CONSULT NOTE   Patient ID: [name] MRN: [id number], DOB/AGE: 11-08-1945   Admit date: 08/25/2014 Date of Consult: 08/25/2014   Primary Physician: Ria Bush, MD Primary Cardiologist: Dr. Dani Gobble Croitoru   Pt. Profile  68F with COPD, HTN, HLD, anemia, prior rectal cancer, and rhematic heart disease s/p bioAVR/MVR (12/12/2013 Bartle) who presents with chest pain.  Problem List  Past Medical History  Diagnosis Date  . IBS (irritable bowel syndrome)   . Allergic rhinitis   . COPD (chronic obstructive pulmonary disease)   . Asthma   . Lung nodule     RLL nodule-60m stable 2006, April 2009, and June 2009  . Anemia   . Hyperlipemia   . Obesity   . Essential hypertension   . Hemorrhoids   . Sleep apnea     PT TOLD BORDERLINE-TRIED CPAP-DID NOT HELP-SHE DOES NOT USE CPAP  . Arthritis   . History of radiation therapy 05/30/11 to 07/07/11    rectum  . History of pneumonia     several times  . History of shingles   . Rectal cancer 04/2011    T3N0; s/p lap LAR, s/p ileostomy, s/p reversal, s/p chemo  . Severe aortic valve stenosis  AVR 3/15 10/01/2013    mild-mod by echo 02/2014  . Rheumatic heart disease mitral stenosis     mod MS by echo 02/2014  . Anxiety   . S/P AVR (aortic valve replacement) 2015    bioprosthetic (B[facility]le)  . S/P MVR (mitral valve replacement) 2015    bioprosthetic (Bartle)  . DDD (degenerative disc disease)     cervical - kyphosis with mod DD changes C5/6 and C6/7; lumbar - early DD at L2/3 (Ellene Route    Past Surgical History  Procedure Laterality Date  . Foot surgery  left foot    hammer toe  . Hand surgery  Bil    carpal tunnel  . Back surgery  2006, 2007    2006 SPACER, 2007 decompression and fusion L4/5  . Tonsillectomy    . Colon resection  08/25/2011    Procedure: COLON RESECTION LAPAROSCOPIC;  Surgeon: FStark Klein MD;  Location: [facility];  Service: General;  Laterality: N/A;  Laparoscopic Assisted Low Anterior Resection  Diverting Ostomy and onQ pain pump  . Ileostomy  08/25/2011  . Ileostomy closure  04/02/2012    Procedure: ILEOSTOMY TAKEDOWN;  Surgeon: FStark Klein MD;  Location: [facility];  Service: General;  Laterality: N/A;  . Bowel resection  04/02/2012    Procedure: SMALL BOWEL RESECTION;  Surgeon: FStark Klein MD;  Location: [facility];  Service: General;  Laterality: N/A;  . Carpal tunnel release    . Cardiac catheterization  11/27/2013  . Tee without cardioversion N/A 11/29/2013    Procedure: TRANSESOPHAGEAL ECHOCARDIOGRAM (TEE);  Surgeon: TSueanne Margarita MD;  [facility] [facility]  Service: Cardiovascular;  Laterality: N/A;  . Aortic valve replacement N/A 12/12/2013    Procedure: AORTIC VALVE REPLACEMENT (AVR);  Surgeon: BGaye Pollack MD; Service: Open Heart Surgery  . Mitral valve replacement N/A 12/12/2013    Procedure: MITRAL VALVE (MV) REPLACEMENT OR REPAIR;  Surgeon: BGaye Pollack MD; Service: Open Heart Surgery  . Intraoperative transesophageal echocardiogram N/A 12/12/2013    Procedure: INTRAOPERATIVE TRANSESOPHAGEAL ECHOCARDIOGRAM;  Surgeon: BGaye Pollack MD;  [facility];  Service: Open Heart Surgery;  Laterality: N/A;  . Colonoscopy  03/2013    1 polyp, rpt 3 yrs (Ardis Hughs     Allergies  Allergies  Allergen Reactions  . Codeine Nausea Only  . Prednisone Hives    In different places all over the body.  . Sulfonamide Derivatives Nausea Only    HPI   68F with COPD, HTN, HLD, anemia, prior rectal cancer, and rhematic heart disease s/p bioAVR/MVR (12/12/2013 Bartle) who presents with chest pain.   [name] has no history of coronary artery disease. She underwent a pre-op cardiac cath in Feb 2015 that demonstrated only minor luminal irregularities.   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  At 10:45am, she developed acute onset central chest pain that radiated to her back. If felt like a "squeezing tightness" that also sometimes radiated to her stomach. No associated  symptoms. Initially she continued to volunteer but then went to the ED when symptoms continued to become more severe. She states the pain is improved with leaning forward. She does not believe it worsens while lying down. Pain is worsened with inspiration. Not reproducible (although it was uncomfortable when someone was pushing hard during a bedside ultrasound). Pain was 9/10 at its worst. No recent abnormal physical exertion.   In the ED, she was hemodynamically stable on arrival: 130/70, HR 83, 96% on RA. Labs were notable for K 4.2, Cr 1.15, POC TnI of 0.6 and 0.07, BNP 666 (pre-op BNP 1068), ESR 30. Due to concern for aortic dissection she underwent a CT angiogram that demonstrated no evidence of aortic dissection or other acute chest process. THere was a small right pleural effusion but no pericardial effusion. ECG demonstrated NSR, 1st degree AVB, and RBBB. Possible PR depression in II and III although baseline is a little wavy. Pain improved with morphine. She has not been given SL NTG.   Inpatient Medications No current facility-administered medications on file prior to encounter.   Current Outpatient Prescriptions on File Prior to Encounter  Medication Sig Dispense Refill  . albuterol (PROVENTIL HFA;VENTOLIN HFA) 108 (90 BASE) MCG/ACT inhaler Inhale 2 puffs into the lungs every 6 (six) hours as needed for wheezing.    Marland Kitchen aspirin 81 MG tablet Take 81 mg by mouth at bedtime.     Marland Kitchen BIOTIN PO Take 1 tablet by mouth at bedtime.     Marland Kitchen BREO ELLIPTA 100-25 MCG/INH AEPB inhale 1 puff by mouth once daily 60 each 5  . Calcium Carbonate-Vitamin D (CALCIUM 600 + D PO) Take 1,200 mg by mouth at bedtime.     . furosemide (LASIX) 40 MG tablet Take 1 tablet (40 mg total) by mouth daily. 30 tablet 11  . lovastatin (MEVACOR) 40 MG tablet take 1 tablet by mouth at bedtime 30 tablet 6  . Miconazole Nitrate 2 % POWD 1 application by Does not apply route daily as needed (for skin). Apply to stomach and under  breasts    . montelukast (SINGULAIR) 10 MG tablet Take 1 tablet (10 mg total) by mouth at bedtime. 30 tablet 11  . pantoprazole (PROTONIX) 40 MG tablet Take 1 tablet (40 mg total) by mouth at bedtime. 90 tablet 2  . polyethylene glycol (MIRALAX / GLYCOLAX) packet Take 17 g by mouth daily. 14 each 0  . potassium chloride (K-DUR,KLOR-CON) 10 MEQ tablet Take 2 tablets (20 mEq total) by mouth daily. 60 tablet 11  . SPIRIVA HANDIHALER 18 MCG inhalation capsule inhale the contents of one capsule in the handihaler once daily 30 capsule 4      Family History Family History  Problem Relation Age of Onset  .  Cancer Father     lung  . Heart disease Mother   . Diabetes Mother   . Hypertension Mother   . Colon cancer Neg Hx   . Esophageal cancer Neg Hx   . Stomach cancer Neg Hx   . Rectal cancer Neg Hx      Social History History   Social History  . Marital Status: Married    Spouse Name: N/A    Number of Children: N/A  . Years of Education: N/A   Occupational History  . retired    Social History Main Topics  . Smoking status: Former Smoker -- 1.50 packs/day for 10 years    Types: Cigarettes    Quit date: 12/27/1974  . Smokeless tobacco: Never Used  . Alcohol Use: No     Comment: rare  . Drug Use: No  . Sexual Activity: No   Other Topics Concern  . Not on file   Social History Narrative   Married 1966   2 sons; 1 grandchild   Retired-[employer]   Doing ok   March '09-financial tough times     Review of Systems  General:  No chills, fever, night sweats or weight changes.  Cardiovascular:  See HPI Dermatological: No rash, lesions/masses Respiratory: No cough, dyspnea Urologic: No hematuria, dysuria Abdominal:   No nausea, vomiting, diarrhea, bright red blood per rectum, melena, or hematemesis Neurologic:  No visual changes, wkns, changes in mental status. All other systems reviewed and are otherwise negative except as noted above.  Physical Exam  Blood pressure 107/52,  pulse 69, temperature 98 F (36.7 C), temperature source Oral, resp. rate 17, SpO2 95 %.  General: Pleasant, NAD Psych: Normal affect. Neuro: Alert and oriented X 3. Moves all extremities spontaneously. HEENT: Normal  Neck: Supple without bruits or JVD. Lungs:  Resp regular and unlabored, CTA. Heart: RRR no s3, s4. No rubs or gallops. 2/6 systolic murmur across precordium.  Abdomen: Soft, non-tender, non-distended, BS + x 4.  Extremities: No clubbing, cyanosis or edema. DP/PT/Radials 2+ and equal bilaterally.  Labs  No results for input(s): CKTOTAL, CKMB, TROPONINI in the last 72 hours. Lab Results  Component Value Date   WBC 8.5 08/25/2014   HGB 12.4 08/25/2014   HCT 38.8 08/25/2014   MCV 87.6 08/25/2014   PLT 182 08/25/2014    Recent Labs Lab 08/25/14 1415  NA 141  K 4.2  CL 102  CO2 27  BUN 23  CREATININE 1.15*  CALCIUM 10.0  GLUCOSE 96   Lab Results  Component Value Date   CHOL 181 07/05/2013   HDL 51 07/05/2013   LDLCALC 107* 07/05/2013   TRIG 117 07/05/2013   Lab Results  Component Value Date   DDIMER 2.65* 10/10/2013    Radiology/Studies  Dg Chest 2 View  08/25/2014   CLINICAL DATA:  Chest pain, prior heart valve replacement  EXAM: CHEST  2 VIEW  COMPARISON:  Radiograph 01/08/2014, CT 07/23/2014  FINDINGS: Sternotomy wires overlie normal cardiac silhouette. Lungs are hyperinflated. No effusion, infiltrate, pneumothorax. Chronic bronchitic markings are noted.  There is a nodule in the right upper lobe not changed recent CT.  IMPRESSION: 1.  No acute cardiopulmonary process. 2. Hyperinflated lungs and chronic bronchitic markings. 3. Stable right upper lobe nodule compared to recent CT.   Electronically Signed   By: Suzy Bouchard [name]   On: 08/25/2014 13:50   Ct Angio Chest Aortic Dissect W &/or W/o  08/25/2014   CLINICAL DATA:  Mid chest  pain radiating into the back with onset today. Evaluate for dissection. Initial encounter.  EXAM: CT ANGIOGRAPHY CHEST  WITH CONTRAST  TECHNIQUE: Multidetector CT imaging of the chest was performed using the standard protocol during bolus administration of intravenous contrast. Multiplanar CT image reconstructions and MIPs were obtained to evaluate the vascular anatomy.  CONTRAST:  100 ml Omnipaque 350.  COMPARISON:  Radiographs today.  CT 07/23/2014.  FINDINGS: Mediastinum: There are no enlarged mediastinal or hilar lymph nodes. The thyroid gland, trachea and esophagus demonstrate no significant findings. Patient is status post median sternotomy and aortic and mitral valve replacement. Diffuse coronary artery calcifications are noted. There is stable dilatation of the ascending aorta to 4.2 cm. No focal aneurysm or dissection demonstrated. There is no evidence of mediastinal hematoma. Although not optimized for evaluation of the pulmonary arteries, there is no evidence of pulmonary embolism.  Lungs/Pleura: A small right pleural effusion is similar to the prior examination. There is no significant left pleural or pericardial effusion.There is stable scarring at both apices, in the right lower lobe and in the lingula. The irregular nodular densities at the right apex on images 23 through 35 are stable. Small lower lobe nodules on images 61 (left) and 98 (right) are stable.  Upper abdomen: The gallbladder is mildly distended without apparent wall thickening or surrounding inflammation. The upper abdomen otherwise appears unremarkable.  Musculoskeletal/Chest wall: There is no axillary adenopathy or chest wall mass. There are no worrisome osseous findings.  Review of the MIP images confirms the above findings.  IMPRESSION: 1. No evidence of aortic dissection or other acute chest process. 2. Diffuse atherosclerosis status post aortic and mitral valve replacement. 3. Stable small right pleural effusion and bilateral pulmonary scarring and nodularity.   Electronically Signed   By: Camie Patience [name]   On: 08/25/2014 18:19   TTE  (05/20/14) - Left ventricle: The cavity size was normal. Wall thickness was increased in a pattern of moderate LVH. Systolic function was vigorous. The estimated ejection fraction was in the range of 65% to 70%. Wall motion was normal; there were no regional wall motion abnormalities. - Aortic valve: A bioprosthesis was present. Valve area (VTI): 1.39 cm^2. Valve area (Vmax): 1.5 cm^2. - Mitral valve: Severely calcified annulus. A bioprosthesis was present. Valve area by continuity equation (using LVOT flow): 1.6 cm^2. - Right ventricle: The cavity size was mildly dilated.  Impressions:  - Technically difficult; vigorous LV function with midcavitary gradient of approximately 3 m/s; bioprosthetic aortic valve with mean gradient of 23 mmHg; bioprosthetic MV with mean gradient of 6 mmHg. Valve replacements are new compared to 02/10/14.  CPET 06/13/14 Exercise testing with gas exchange demonstrates a low normal functional capacity when compared to matched sedentary norms. The significant improvement in the relative VO2 when adjusted for ideal body may reflect the patient's overall functional limitation being related to her body habitus. There does not appear to be a significant circulatory or ventilatory limitation to the exercise. Deconditioning likely playing a role in her symptoms.    Cath 11/27/13 Angiographic Findings:  All her coronaries are very large in caliber. 1. The left main coronary artery is free of significant atherosclerosis and bifurcates in the usual fashion into the left anterior descending artery and left circumflex coronary artery.  2. The left anterior descending artery is a large vessel that reaches the apex and generates two major diagonal branches. There is evidence of minimal luminal irregularities and no calcification. No hemodynamically meaningful stenoses are seen. 3.  The left circumflex coronary artery is a large-size vessel non dominant vessel that generates  three major oblique marginal arteries. There is evidence of minimal luminal irregularities and no calcification. There is a 30% smooth stenosis in the distal vessel. No other hemodynamically meaningful stenoses are seen. 4. The right coronary artery is a very large-size dominant vessel that generates a long posterior lateral ventricular system as well as the PDA. There is evidence of minimal luminal irregularities and no calcification. No hemodynamically meaningful stenoses are seen.  5. The left ventricle is normal in size. The left ventricle systolic function is hyperdynamic with an estimated ejection fraction of >70%. Regional wall motion abnormalities are not seen. No left ventricular thrombus is seen. There is probably mild mitral insufficiency. The ascending aorta appears minimally dilated. Heavily calcified aortic valve leaflets with restricted mobility are seen by fluoroscopy. The mitral annulus is also heavily calcified. There is severe aortic valve stenosis by pullback. The left ventricular end-diastolic pressure is 15 mm Hg.    IMPRESSIONS:  No significant CAD. Hyperdynamic left ventricular function. Severe aortic stenosis. Moderate mitral valve stenosis. Mild pulmonary artery hypertension.     ECG  08/25/13 13:09 NSR, 1st degree AVB, RBBB, borderline PR depression in II and III. Compared to prior from 07/02/14, borderline PR depression is new.   ASSESSMENT AND PLAN  71F with COPD, HTN, HLD, anemia, prior rectal cancer, and rhematic heart disease s/p bioAVR/MVR (12/12/2013 Bartle) who presents with chest pain. Now that PE and aortic dissection have been ruled out based on CT angiogram, remaining considerations include pericarditis, MSK pain, and ACS.  Her nearly normal cath from Feb 2015 suggests her risk for ACS is fairly low. Although the chest CT did not demonstrate an effusion, ESR is nearly normal, and the ECG findings are subtle, her story is most consistent with pericarditis.  Give diagnostic uncertaintly, intermittent pain in the setting of risk factors for CAD, will plan to admit.  1. Cycle troponins 2. Check CRP 3. PRN morphine; will hold off on NSAIDS for now given diagnostic uncertainty  4. Echo  5. Unless serial ECGs and or CRP are strongly in favor of pericarditis, she will probably need an ischemic evaluation (probably stress test). Will keep NPO after MN  Signed, Lamar Sprinkles, MD 08/25/2014, 8:38 PM

## 2014-08-26 DIAGNOSIS — I359 Nonrheumatic aortic valve disorder, unspecified: Secondary | ICD-10-CM

## 2014-08-26 DIAGNOSIS — D649 Anemia, unspecified: Secondary | ICD-10-CM | POA: Diagnosis not present

## 2014-08-26 DIAGNOSIS — I517 Cardiomegaly: Secondary | ICD-10-CM

## 2014-08-26 DIAGNOSIS — R0789 Other chest pain: Secondary | ICD-10-CM

## 2014-08-26 DIAGNOSIS — J449 Chronic obstructive pulmonary disease, unspecified: Secondary | ICD-10-CM | POA: Diagnosis not present

## 2014-08-26 DIAGNOSIS — R0600 Dyspnea, unspecified: Secondary | ICD-10-CM | POA: Diagnosis not present

## 2014-08-26 DIAGNOSIS — I059 Rheumatic mitral valve disease, unspecified: Secondary | ICD-10-CM

## 2014-08-26 DIAGNOSIS — K589 Irritable bowel syndrome without diarrhea: Secondary | ICD-10-CM | POA: Diagnosis not present

## 2014-08-26 LAB — TROPONIN I
Troponin I: <0.3 ng/mL (ref <0.30)
Troponin I: <0.3 ng/mL (ref <0.30)
Troponin I: <0.3 ng/mL (ref <0.30)

## 2014-08-26 LAB — C-REACTIVE PROTEIN
CRP: <0.5 mg/dL — ABNORMAL LOW (ref <0.60)
CRP: <0.5 mg/dL — ABNORMAL LOW (ref <0.60)

## 2014-08-26 NOTE — Plan of Care (Signed)
Problem: Phase I Progression Outcomes Goal: Anginal pain relieved Outcome: Progressing

## 2014-08-26 NOTE — Progress Notes (Signed)
One hour after morphine and zofran were given, pt's pain and nausea were still unrelieved. GI cocktail given at 0340. Pt now rates pain at 3/10. Will continue to monitor.

## 2014-08-26 NOTE — Plan of Care (Signed)
Problem: Consults Goal: Chest Pain Patient Education (See Patient Education module for education specifics.)  Outcome: Completed/Met Date Met:  08/26/14 Goal: Skin Care Protocol Initiated - if Braden Score 18 or less If consults are not indicated, leave blank or document N/A  Outcome: Not Applicable Date Met:  08/26/14 Goal: Nutrition Consult-if indicated Outcome: Not Applicable Date Met:  08/26/14 Goal: Diabetes Guidelines if Diabetic/Glucose > 140 If diabetic or lab glucose is > 140 mg/dl - Initiate Diabetes/Hyperglycemia Guidelines & Document Interventions  Outcome: Not Applicable Date Met:  08/26/14  Problem: Phase I Progression Outcomes Goal: Hemodynamically stable Outcome: Completed/Met Date Met:  08/26/14 Goal: Aspirin unless contraindicated Outcome: Completed/Met Date Met:  08/26/14 Goal: Voiding-avoid urinary catheter unless indicated Outcome: Completed/Met Date Met:  08/26/14     

## 2014-08-26 NOTE — Progress Notes (Signed)
UR completed 

## 2014-08-26 NOTE — Progress Notes (Signed)
At 0230, pt c/o chest pain 6/10. CP radiating to back and shoulder blades. Pt confirmed nausea; states that taking a deep breath makes the pain much worse and that sitting up and leaning over makes it better. Morphine and zofran given. Will continue to monitor.

## 2014-08-26 NOTE — Progress Notes (Signed)
  Echocardiogram 2D Echocardiogram has been performed.  [name] 08/26/2014, 3:43 PM

## 2014-08-26 NOTE — Progress Notes (Signed)
Patient Name: [name] Date of Encounter: 08/26/2014     Active Problems:   Chest pain    SUBJECTIVE  Had chest pain over night. Feels better when leaning forward. Nauseated and dry heaving as well.   CURRENT MEDS . aspirin EC  81 mg Oral Daily  . furosemide  40 mg Oral Daily  . heparin  5,000 Units Subcutaneous 3 times per day  . montelukast  10 mg Oral QHS  . pantoprazole  40 mg Oral QHS  . polyethylene glycol  17 g Oral Daily  . potassium chloride  20 mEq Oral Daily  . pravastatin  40 mg Oral q1800  . tiotropium  18 mcg Inhalation Daily    OBJECTIVE  Filed Vitals:   08/25/14 2145 08/25/14 2243 08/25/14 2306 08/26/14 0314  BP: 115/61 101/51 108/47 128/63  Pulse: 84  66 71  Temp:   98 F (36.7 C) 98.1 F (36.7 C)  TempSrc:   Oral Oral  Resp: $Remo'17  12 16  'gwksm$ Height:   '5\' 8"'$  (1.727 m)   Weight:   216 lb 4.8 oz (98.113 kg) 215 lb 3.2 oz (97.614 kg)  SpO2: 96%  93% 92%    Intake/Output Summary (Last 24 hours) at 08/26/14 0757 Last data filed at 08/25/14 2310  Gross per 24 hour  Intake    240 ml  Output      0 ml  Net    240 ml   Filed Weights   08/25/14 2306 08/26/14 0314  Weight: 216 lb 4.8 oz (98.113 kg) 215 lb 3.2 oz (97.614 kg)    PHYSICAL EXAM  General: Pleasant, NAD. Neuro: Alert and oriented X 3. Moves all extremities spontaneously. Psych: Normal affect. HEENT:  Normal  Neck: Supple without bruits or JVD. Lungs:  Resp regular and unlabored, CTA. Heart: RRR no s3, s4,  + murmur. Abdomen: Soft, non-tender, non-distended, BS + x 4.  Extremities: No clubbing, cyanosis or edema. DP/PT/Radials 2+ and equal bilaterally.  Accessory Clinical Findings  CBC  Recent Labs  08/25/14 1415  WBC 8.5  HGB 12.4  HCT 38.8  MCV 87.6  PLT 163   Basic Metabolic Panel  Recent Labs  08/25/14 1415  NA 141  K 4.2  CL 102  CO2 27  GLUCOSE 96  BUN 23  CREATININE 1.15*  CALCIUM 10.0   Cardiac Enzymes  Recent Labs  08/25/14 2352  08/26/14 0231 08/26/14 0537  TROPONINI <0.30 <0.30 <0.30    TELE  NSR with freq PVCs and bigeminy    Radiology/Studies  Dg Chest 2 View  08/25/2014   CLINICAL DATA:  Chest pain, prior heart valve replacement  EXAM: CHEST  2 VIEW  COMPARISON:  Radiograph 01/08/2014, CT 07/23/2014  FINDINGS: Sternotomy wires overlie normal cardiac silhouette. Lungs are hyperinflated. No effusion, infiltrate, pneumothorax. Chronic bronchitic markings are noted.  There is a nodule in the right upper lobe not changed recent CT.  IMPRESSION: 1.  No acute cardiopulmonary process. 2. Hyperinflated lungs and chronic bronchitic markings. 3. Stable right upper lobe nodule compared to recent CT.   Electronically Signed   By: Suzy Bouchard [name]   On: 08/25/2014 13:50   Ct Angio Chest Aortic Dissect W &/or W/o  08/25/2014   CLINICAL DATA:  Mid chest pain radiating into the back with onset today. Evaluate for dissection. Initial encounter.  EXAM: CT ANGIOGRAPHY CHEST WITH CONTRAST  TECHNIQUE: Multidetector CT imaging of the chest was performed using the standard protocol during bolus  administration of intravenous contrast. Multiplanar CT image reconstructions and MIPs were obtained to evaluate the vascular anatomy.  CONTRAST:  100 ml Omnipaque 350.  COMPARISON:  Radiographs today.  CT 07/23/2014.  FINDINGS: Mediastinum: There are no enlarged mediastinal or hilar lymph nodes. The thyroid gland, trachea and esophagus demonstrate no significant findings. Patient is status post median sternotomy and aortic and mitral valve replacement. Diffuse coronary artery calcifications are noted. There is stable dilatation of the ascending aorta to 4.2 cm. No focal aneurysm or dissection demonstrated. There is no evidence of mediastinal hematoma. Although not optimized for evaluation of the pulmonary arteries, there is no evidence of pulmonary embolism.  Lungs/Pleura: A small right pleural effusion is similar to the prior examination. There  is no significant left pleural or pericardial effusion.There is stable scarring at both apices, in the right lower lobe and in the lingula. The irregular nodular densities at the right apex on images 23 through 35 are stable. Small lower lobe nodules on images 61 (left) and 98 (right) are stable.  Upper abdomen: The gallbladder is mildly distended without apparent wall thickening or surrounding inflammation. The upper abdomen otherwise appears unremarkable.  Musculoskeletal/Chest wall: There is no axillary adenopathy or chest wall mass. There are no worrisome osseous findings.  Review of the MIP images confirms the above findings.  IMPRESSION: 1. No evidence of aortic dissection or other acute chest process. 2. Diffuse atherosclerosis status post aortic and mitral valve replacement. 3. Stable small right pleural effusion and bilateral pulmonary scarring and nodularity.   Electronically Signed   By: Camie Patience [name]   On: 08/25/2014 18:19    ASSESSMENT AND PLAN  70F with COPD, HTN, HLD, anemia, prior rectal cancer, and rhematic heart disease s/p bioAVR/MVR (12/12/2013 Bartle) who presented to [facility] yesterday with chest pain.  Chest pain- -- CTA neg for PE and aortic dissection   -- Nearly normal cath from Feb 2015  -- Chest CT did not demonstrate an effusion, ESR slightly elevated at 30 and the ECG with RBBB, her story is most consistent with pericarditis.  -- Troponin neg x3. NPO for possible stress test.     Signed, Eileen Stanford PA-C  Pager [phone]  I have examined the patient and reviewed assessment and plan and discussed with patient.  Agree with above as stated.  She juist had a cath in early 2015.  This was negative for severe CAD.  Doubt that she would have developed significant CAD so soon.  Will manage conservatively.  No LE swelling.  No RF for PE.  Equal radial pulses bilaterally.  Manage pain conservatively.    VARANASI,JAYADEEP S.

## 2014-08-26 NOTE — Progress Notes (Signed)
Pt c/o headache from caffeine withdrawal, spoke with Nell Range PA, ok to let pt have caffeinated drinks. Carroll Kinds RN

## 2014-08-27 ENCOUNTER — Encounter (HOSPITAL_COMMUNITY): Payer: Self-pay | Admitting: Physician Assistant

## 2014-08-27 DIAGNOSIS — I10 Essential (primary) hypertension: Secondary | ICD-10-CM

## 2014-08-27 DIAGNOSIS — E785 Hyperlipidemia, unspecified: Secondary | ICD-10-CM

## 2014-08-27 DIAGNOSIS — Z954 Presence of other heart-valve replacement: Secondary | ICD-10-CM

## 2014-08-27 MED ORDER — IBUPROFEN 200 MG PO TABS: 400.0000 mg | ORAL_TABLET | Freq: Two times a day (BID) | ORAL | Status: AC

## 2014-08-27 NOTE — Progress Notes (Signed)
Patient Name: [name] Date of Encounter: 08/27/2014     Active Problems:   Chest pain    SUBJECTIVE  No more chest pain. Frustrated we cant tell her why she was having chest pain. She is feeling better today and thinks she would be able to go home.   CURRENT MEDS . aspirin EC  81 mg Oral Daily  . furosemide  40 mg Oral Daily  . heparin  5,000 Units Subcutaneous 3 times per day  . montelukast  10 mg Oral QHS  . pantoprazole  40 mg Oral QHS  . polyethylene glycol  17 g Oral Daily  . potassium chloride  20 mEq Oral Daily  . pravastatin  40 mg Oral q1800  . tiotropium  18 mcg Inhalation Daily    OBJECTIVE  Filed Vitals:   08/26/14 1835 08/26/14 2131 08/27/14 0044 08/27/14 0500  BP: 113/46 113/47 102/37 104/46  Pulse: 82 80 71 71  Temp: 99.2 F (37.3 C) 98.9 F (37.2 C) 98.4 F (36.9 C) 98.4 F (36.9 C)  TempSrc: Oral Oral Oral Oral  Resp: 16     Height:      Weight:    217 lb 4.8 oz (98.567 kg)  SpO2: 93% 95% 92% 95%    Intake/Output Summary (Last 24 hours) at 08/27/14 0706 Last data filed at 08/26/14 2025  Gross per 24 hour  Intake    340 ml  Output    200 ml  Net    140 ml   Filed Weights   08/25/14 2306 08/26/14 0314 08/27/14 0500  Weight: 216 lb 4.8 oz (98.113 kg) 215 lb 3.2 oz (97.614 kg) 217 lb 4.8 oz (98.567 kg)    PHYSICAL EXAM  General: Pleasant, NAD. Neuro: Alert and oriented X 3. Moves all extremities spontaneously. Psych: Normal affect. HEENT:  Normal  Neck: Supple without bruits or JVD. Lungs:  Resp regular and unlabored, CTA. Heart: RRR no s3, s4,  + murmur. Abdomen: Soft, non-tender, non-distended, BS + x 4.  Extremities: No clubbing, cyanosis or edema. DP/PT/Radials 2+ and equal bilaterally.  Accessory Clinical Findings  CBC  Recent Labs  08/25/14 1415  WBC 8.5  HGB 12.4  HCT 38.8  MCV 87.6  PLT 916   Basic Metabolic Panel  Recent Labs  08/25/14 1415  NA 141  K 4.2  CL 102  CO2 27  GLUCOSE 96  BUN 23    CREATININE 1.15*  CALCIUM 10.0   Cardiac Enzymes  Recent Labs  08/25/14 2352 08/26/14 0231 08/26/14 0537  TROPONINI <0.30 <0.30 <0.30    TELE  NSR   Radiology/Studies  Dg Chest 2 View  08/25/2014   CLINICAL DATA:  Chest pain, prior heart valve replacement  EXAM: CHEST  2 VIEW  COMPARISON:  Radiograph 01/08/2014, CT 07/23/2014  FINDINGS: Sternotomy wires overlie normal cardiac silhouette. Lungs are hyperinflated. No effusion, infiltrate, pneumothorax. Chronic bronchitic markings are noted.  There is a nodule in the right upper lobe not changed recent CT.  IMPRESSION: 1.  No acute cardiopulmonary process. 2. Hyperinflated lungs and chronic bronchitic markings. 3. Stable right upper lobe nodule compared to recent CT.   Electronically Signed   By: Suzy Bouchard [name]   On: 08/25/2014 13:50   Ct Angio Chest Aortic Dissect W &/or W/o  08/25/2014   CLINICAL DATA:  Mid chest pain radiating into the back with onset today. Evaluate for dissection. Initial encounter.  EXAM: CT ANGIOGRAPHY CHEST WITH CONTRAST  TECHNIQUE: Multidetector CT  imaging of the chest was performed using the standard protocol during bolus administration of intravenous contrast. Multiplanar CT image reconstructions and MIPs were obtained to evaluate the vascular anatomy.  CONTRAST:  100 ml Omnipaque 350.  COMPARISON:  Radiographs today.  CT 07/23/2014.  FINDINGS: Mediastinum: There are no enlarged mediastinal or hilar lymph nodes. The thyroid gland, trachea and esophagus demonstrate no significant findings. Patient is status post median sternotomy and aortic and mitral valve replacement. Diffuse coronary artery calcifications are noted. There is stable dilatation of the ascending aorta to 4.2 cm. No focal aneurysm or dissection demonstrated. There is no evidence of mediastinal hematoma. Although not optimized for evaluation of the pulmonary arteries, there is no evidence of pulmonary embolism.  Lungs/Pleura: A small right  pleural effusion is similar to the prior examination. There is no significant left pleural or pericardial effusion.There is stable scarring at both apices, in the right lower lobe and in the lingula. The irregular nodular densities at the right apex on images 23 through 35 are stable. Small lower lobe nodules on images 61 (left) and 98 (right) are stable.  Upper abdomen: The gallbladder is mildly distended without apparent wall thickening or surrounding inflammation. The upper abdomen otherwise appears unremarkable.  Musculoskeletal/Chest wall: There is no axillary adenopathy or chest wall mass. There are no worrisome osseous findings.  Review of the MIP images confirms the above findings.  IMPRESSION: 1. No evidence of aortic dissection or other acute chest process. 2. Diffuse atherosclerosis status post aortic and mitral valve replacement. 3. Stable small right pleural effusion and bilateral pulmonary scarring and nodularity.   Electronically Signed   By: Camie Patience [name]   On: 08/25/2014 18:19    2D ECHO: 08/26/2014 LV EF: 60% -  65% Study Conclusions - Left ventricle: The cavity size was normal. There was mild concentric hypertrophy. Systolic function was normal. The estimated ejection fraction was in the range of 60% to 65%. Wall motion was normal; there were no regional wall motion abnormalities. - Aortic valve: Poorly visualized. A bioprosthesis was present. - Mitral valve: There is a MV prosthesis in place which is poorly visualized. There is heavy calcification of the MV annulus and some degree of calcification and thickening of the valve leaflets but cannot visualize adequately to comment any further. The mean MV gradient is 35mmHg consistent with severe mitral stenosis but the pressure halftime is normal Recommend TEE for further evaluation if clinically indicated. Poorly visualized. A bioprosthesis was present. - Pulmonic valve: There was trivial  regurgitation.   ASSESSMENT AND PLAN  51F with COPD, HTN, HLD, anemia, prior rectal cancer, and rhematic heart disease s/p bioAVR/MVR (12/12/2013 [place]) who presented to [facility] on 08/25/14 with chest pain.  Chest pain- -- CTA neg for PE and aortic dissection. Low risk for PE -- Nearly normal cath from Feb 2015  -- Chest CT did not demonstrate an effusion, ESR slightly elevated at 30 and the ECG with RBBB.  -- Troponin neg x3.  -- 2D ECHO with EF 60-65%. No WMAs. Possible severe MS. Recommended TEE for further evaluation if clinically indicated. (see full report above) Will defer to MD.   Otherwise, no further chest pain and may be able to be discharged home today with close follow up with Dr. Loletha Grayer in the office.      SignedEileen Stanford PA-C  Pager [phone]  I have examined the patient and reviewed assessment and plan and discussed with patient.  Agree with above as stated.  She feels better today.  Pericarditis vs. Viral gastroenteritis given teh vomiting and stomach upset yesterday.  Clinically, she does not have severe mitral stenosis.  WOuld defer to Dr. Avon Gully regarding need for TEE.  It does not have to be done in the hospital.    She can take Ibuprofen 400 mg BID for 3 days in the event this was mild pericarditis.    Kaycen Whitworth S.

## 2014-08-27 NOTE — Discharge Summary (Signed)
Discharge Summary   Patient ID: [name] MRN: [id number], DOB/AGE: 1946/07/03 68 y.o. Admit date: 08/25/2014 D/C date:     08/27/2014  Primary Cardiologist: Dr. 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 Possibly pericarditis vs GI  HPI: [name] is a 68 y.o. female with a history of COPD, HTN, HLD, anemia, prior rectal cancer, and rhematic heart disease s/p bioAVR/MVR (12/12/2013 [place]) who presented to [facility] on 08/25/14 with chest pain  [name] has no history of coronary artery disease. She underwent a pre-op cardiac cath in Feb 2015 that demonstrated only minor luminal irregularities. 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 At 10:45am, she developed acute onset central chest pain that radiated to her back. If felt like a "squeezing tightness" that also sometimes radiated to her stomach. No associated symptoms. Initially she continued to volunteer but then went to the ED when symptoms continued to become more severe. She stated the pain was improved with leaning forward. She does not believe it worsens while lying down. Pain is worsened with inspiration. Not reproducible (although it was uncomfortable when someone was pushing hard during a bedside ultrasound). Pain was 9/10 at its worst.  Hospital Course  Chest pain- -- CTA neg for PE and aortic dissection -- Nearly normal cath from Feb 2015  -- Chest CT did not demonstrate an effusion, ESR slightly elevated at 30 and the ECG with RBBB.  -- Troponin neg x3.  -- 2D ECHO  with EF 60-65%. No WMAs. Possible severe MS. Recommended TEE for further evaluation if clinically indicated. However, clinically she does not have sx of mitral stenosis and we will defer decision about follow up TEE to her primary cardiologist at outpatient follow up.  -- Chest pain could have been caused by pericarditis vs viral gastroenteritis given that she had some n/vomiting during her admission.  -- Will empirically treat her with ibupofen 400 mg BID for 3 days in the event that this is a mild pericarditis  The patient has had an uncomplicated hospital course and is recovering well. She has been seen by Dr. Irish Lack today and deemed ready for discharge home. All follow-up appointments have been scheduled. Discharge medications are listed below.   Discharge Vitals: Blood pressure 124/43, pulse 77, temperature 98.5 F (36.9 C), temperature source Oral, resp. rate 18, height $RemoveBe'5\' 8"'hsPeogXqG$  (1.727 m), weight 217 lb 4.8 oz (98.567 kg), SpO2 95 %.  Labs: Lab Results  Component Value Date   WBC 8.5 08/25/2014   HGB 12.4 08/25/2014   HCT 38.8 08/25/2014   MCV 87.6 08/25/2014   PLT 182 08/25/2014     Recent Labs Lab 08/25/14 1415  NA 141  K 4.2  CL 102  CO2 27  BUN 23  CREATININE 1.15*  CALCIUM 10.0  GLUCOSE 96    Recent Labs  08/25/14 2352 08/26/14 0231 08/26/14 0537  TROPONINI <0.30 <0.30 <0.30     Diagnostic Studies/Procedures   Dg Chest 2 View  08/25/2014   CLINICAL DATA:  Chest pain, prior heart valve replacement  EXAM:  CHEST  2 VIEW  COMPARISON:  Radiograph 01/08/2014, CT 07/23/2014  FINDINGS: Sternotomy wires overlie normal cardiac silhouette. Lungs are hyperinflated. No effusion, infiltrate, pneumothorax. Chronic bronchitic markings are noted.  There is a nodule in the right upper lobe not changed recent CT.  IMPRESSION: 1.  No acute cardiopulmonary process. 2. Hyperinflated lungs and chronic bronchitic markings. 3. Stable right upper lobe nodule compared to recent CT.    Electronically Signed   By: Suzy Bouchard [name]   On: 08/25/2014 13:50   Ct Angio Chest Aortic Dissect W &/or W/o  08/25/2014   CLINICAL DATA:  Mid chest pain radiating into the back with onset today. Evaluate for dissection. Initial encounter.  EXAM: CT ANGIOGRAPHY CHEST WITH CONTRAST  TECHNIQUE: Multidetector CT imaging of the chest was performed using the standard protocol during bolus administration of intravenous contrast. Multiplanar CT image reconstructions and MIPs were obtained to evaluate the vascular anatomy.  CONTRAST:  100 ml Omnipaque 350.  COMPARISON:  Radiographs today.  CT 07/23/2014.  FINDINGS: Mediastinum: There are no enlarged mediastinal or hilar lymph nodes. The thyroid gland, trachea and esophagus demonstrate no significant findings. Patient is status post median sternotomy and aortic and mitral valve replacement. Diffuse coronary artery calcifications are noted. There is stable dilatation of the ascending aorta to 4.2 cm. No focal aneurysm or dissection demonstrated. There is no evidence of mediastinal hematoma. Although not optimized for evaluation of the pulmonary arteries, there is no evidence of pulmonary embolism.  Lungs/Pleura: A small right pleural effusion is similar to the prior examination. There is no significant left pleural or pericardial effusion.There is stable scarring at both apices, in the right lower lobe and in the lingula. The irregular nodular densities at the right apex on images 23 through 35 are stable. Small lower lobe nodules on images 61 (left) and 98 (right) are stable.  Upper abdomen: The gallbladder is mildly distended without apparent wall thickening or surrounding inflammation. The upper abdomen otherwise appears unremarkable.  Musculoskeletal/Chest wall: There is no axillary adenopathy or chest wall mass. There are no worrisome osseous findings.  Review of the MIP images confirms the above findings.  IMPRESSION: 1. No evidence of aortic dissection or  other acute chest process. 2. Diffuse atherosclerosis status post aortic and mitral valve replacement. 3. Stable small right pleural effusion and bilateral pulmonary scarring and nodularity.   Electronically Signed   By: Camie Patience [name]   On: 08/25/2014 18:19    2D ECHO: 08/26/2014 LV EF: 60% -  65% Study Conclusions - Left ventricle: The cavity size was normal. There was mild concentric hypertrophy. Systolic function was normal. The estimated ejection fraction was in the range of 60% to 65%. Wall motion was normal; there were no regional wall motion abnormalities. - Aortic valve: Poorly visualized. A bioprosthesis was present. - Mitral valve: There is a MV prosthesis in place which is poorly visualized. There is heavy calcification of the MV annulus and some degree of calcification and thickening of the valve leaflets but cannot visualize adequately to comment any further. The mean MV gradient is 57mmHg consistent with severe mitral stenosis but the pressure halftime is normal Recommend TEE for further evaluation if clinically indicated. Poorly visualized. A bioprosthesis was present. - Pulmonic valve: There was trivial regurgitation.    Discharge Medications     Medication List    TAKE these medications        albuterol 108 (90 BASE) MCG/ACT inhaler  Commonly known as:  PROVENTIL HFA;VENTOLIN HFA  Inhale 2 puffs into the lungs every 6 (six) hours as needed for wheezing.     aspirin 81 MG tablet  Take 81 mg by mouth at bedtime.     BIOTIN PO  Take 1 tablet by mouth at bedtime.     BREO ELLIPTA 100-25 MCG/INH Aepb  Generic drug:  Fluticasone Furoate-Vilanterol  inhale 1 puff by mouth once daily     CALCIUM 600 + D PO  Take 1,200 mg by mouth at bedtime.     furosemide 40 MG tablet  Commonly known as:  LASIX  Take 1 tablet (40 mg total) by mouth daily.     ibuprofen 200 MG tablet  Commonly known as:  ADVIL  Take 2 tablets (400 mg total) by  mouth 2 (two) times daily.     lovastatin 40 MG tablet  Commonly known as:  MEVACOR  take 1 tablet by mouth at bedtime     Miconazole Nitrate 2 % Powd  1 application by Does not apply route daily as needed (for skin). Apply to stomach and under breasts     montelukast 10 MG tablet  Commonly known as:  SINGULAIR  Take 1 tablet (10 mg total) by mouth at bedtime.     pantoprazole 40 MG tablet  Commonly known as:  PROTONIX  Take 1 tablet (40 mg total) by mouth at bedtime.     polyethylene glycol packet  Commonly known as:  MIRALAX / GLYCOLAX  Take 17 g by mouth daily.     potassium chloride 10 MEQ tablet  Commonly known as:  K-DUR,KLOR-CON  Take 2 tablets (20 mEq total) by mouth daily.     SPIRIVA HANDIHALER 18 MCG inhalation capsule  Generic drug:  tiotropium  inhale the contents of one capsule in the handihaler once daily        Disposition   The patient will be discharged in stable condition to home.  Follow-up Information    Follow up with Sanda Klein, MD On 09/17/2014.   Specialty:  Cardiology   Why:  @ 9:15 am   Contact information:   [address] [place] [address] [phone]         Duration of Discharge Encounter: Greater than 30 minutes including physician and PA time.  SignedAngelena Form R PA-C 08/27/2014, 10:51 AM   I have examined the patient and reviewed assessment and plan and discussed with patient. Agree with above as stated. She feels better today. Pericarditis vs. Viral gastroenteritis given teh vomiting and stomach upset yesterday. Clinically, she does not have severe mitral stenosis. WOuld defer to Dr. Avon Gully regarding need for TEE. It does not have to be done in the hospital.   She can take Ibuprofen 400 mg BID for 3 days in the event this was mild pericarditis.   Prisha Hiley S.

## 2014-08-27 NOTE — Discharge Instructions (Signed)

## 2014-09-09 ENCOUNTER — Encounter (HOSPITAL_COMMUNITY)
Admission: RE | Admit: 2014-09-09 | Discharge: 2014-09-09 | Disposition: A | Discharge status: Home / Self Care | Payer: Self-pay | Source: Ambulatory Visit | Attending: Internal Medicine | Admitting: Internal Medicine

## 2014-09-09 DIAGNOSIS — R0600 Dyspnea, unspecified: Secondary | ICD-10-CM | POA: Insufficient documentation

## 2014-09-09 DIAGNOSIS — Z954 Presence of other heart-valve replacement: Secondary | ICD-10-CM | POA: Insufficient documentation

## 2014-09-10 ENCOUNTER — Ambulatory Visit: Payer: Medicare Other | Attending: General Surgery | Admitting: Physical Therapy

## 2014-09-11 ENCOUNTER — Encounter (HOSPITAL_COMMUNITY)
Admission: RE | Admit: 2014-09-11 | Discharge: 2014-09-11 | Disposition: A | Discharge status: Home / Self Care | Payer: Self-pay | Source: Ambulatory Visit | Attending: Internal Medicine | Admitting: Internal Medicine

## 2014-09-11 ENCOUNTER — Ambulatory Visit: Payer: Medicare Other | Attending: General Surgery | Admitting: Physical Therapy

## 2014-09-11 DIAGNOSIS — N8189 Other female genital prolapse: Secondary | ICD-10-CM | POA: Diagnosis present

## 2014-09-16 ENCOUNTER — Ambulatory Visit (INDEPENDENT_AMBULATORY_CARE_PROVIDER_SITE_OTHER): Payer: Medicare Other | Admitting: *Deleted

## 2014-09-16 ENCOUNTER — Encounter (HOSPITAL_COMMUNITY)
Admission: RE | Admit: 2014-09-16 | Discharge: 2014-09-16 | Disposition: A | Discharge status: Home / Self Care | Payer: Self-pay | Source: Ambulatory Visit | Attending: Internal Medicine | Admitting: Internal Medicine

## 2014-09-16 DIAGNOSIS — Z23 Encounter for immunization: Secondary | ICD-10-CM

## 2014-09-17 ENCOUNTER — Ambulatory Visit (INDEPENDENT_AMBULATORY_CARE_PROVIDER_SITE_OTHER): Payer: Medicare Other | Admitting: Cardiovascular Disease

## 2014-09-17 VITALS — BP 126/72 | HR 76 | Resp 16 | Ht 68.0 in | Wt 216.4 lb

## 2014-09-17 DIAGNOSIS — Z952 Presence of prosthetic heart valve: Secondary | ICD-10-CM

## 2014-09-17 DIAGNOSIS — I5032 Chronic diastolic (congestive) heart failure: Secondary | ICD-10-CM

## 2014-09-17 DIAGNOSIS — I10 Essential (primary) hypertension: Secondary | ICD-10-CM

## 2014-09-17 DIAGNOSIS — Z954 Presence of other heart-valve replacement: Secondary | ICD-10-CM

## 2014-09-17 DIAGNOSIS — E785 Hyperlipidemia, unspecified: Secondary | ICD-10-CM

## 2014-09-17 DIAGNOSIS — R0789 Other chest pain: Secondary | ICD-10-CM

## 2014-09-17 NOTE — Patient Instructions (Signed)
Dr. Sallyanne Kuster recommends that you schedule a follow-up appointment in: 6 Months.

## 2014-09-18 ENCOUNTER — Encounter (HOSPITAL_COMMUNITY): Payer: Self-pay | Admitting: Cardiovascular Disease

## 2014-09-18 ENCOUNTER — Encounter (HOSPITAL_COMMUNITY)
Admission: RE | Admit: 2014-09-18 | Discharge: 2014-09-18 | Disposition: A | Discharge status: Home / Self Care | Payer: Self-pay | Source: Ambulatory Visit | Attending: Internal Medicine | Admitting: Internal Medicine

## 2014-09-18 ENCOUNTER — Ambulatory Visit: Payer: Medicare Other | Admitting: Physical Therapy

## 2014-09-18 DIAGNOSIS — N8189 Other female genital prolapse: Secondary | ICD-10-CM | POA: Diagnosis not present

## 2014-09-19 ENCOUNTER — Encounter: Payer: Self-pay | Admitting: Cardiovascular Disease

## 2014-09-19 DIAGNOSIS — I5032 Chronic diastolic (congestive) heart failure: Secondary | ICD-10-CM

## 2014-09-19 HISTORY — DX: Chronic diastolic (congestive) heart failure: I50.32

## 2014-09-19 NOTE — Progress Notes (Signed)
Patient ID: [name], female   DOB: [birth year], 68 y.o.   MRN: [id number]      Reason for office visit Recent hospitalization.  [name] had an overnight hospital stay for chest pain of unclear etiology. The features were confusing. It was a severe constrictive pain that would release and let go, coming and going for several hours, not associated with meals or exertion. It felt better when she leaned forward. It was severe while the echo tech was acquiring parasternal images. Cardiac enzymes, ECG, chest CT angio and echo were nondiagnostic. She had normal coronaries at cath before her valve replacement surgery in February. The possibility of mitral stenosis was raised on the echo, but I don't think the interpreting physician realized that the mitral valve is bioprosthetic.  She is now roughly 8 months status post replacement of both her aortic valve and mitral valve for combined aortic stenosis and mitral stenosis. Surgery was performed on March 5 and she was discharged from the hospital on March 13. The aortic valve prosthesis is a 21 mm [name] pericardial tissue valve and the mitral valve is a 25 mm device of the same make. She has gradually recovered and has participated in cardiac rehabilitation and now pulmonary rehabilitation, which she finds very helpful. She has occasional wheezing. She was treated by [name] with doxycycline and steroids in May. She continues to have dyspnea on exertion, New York Heart Association functional class II.    Allergies  Allergen Reactions  . Codeine Nausea Only  . Prednisone Hives    In different places all over the body.  . Sulfonamide Derivatives Nausea Only    Current Outpatient Prescriptions  Medication Sig Dispense Refill  . albuterol (PROVENTIL HFA;VENTOLIN HFA) 108 (90 BASE) MCG/ACT inhaler Inhale 2 puffs into the lungs every 6 (six) hours as needed for wheezing.    Marland Kitchen aspirin 81 MG tablet Take 81 mg by mouth at  bedtime.     Marland Kitchen BIOTIN PO Take 1 tablet by mouth at bedtime.     Marland Kitchen BREO ELLIPTA 100-25 MCG/INH AEPB inhale 1 puff by mouth once daily 60 each 5  . Calcium Carbonate-Vitamin D (CALCIUM 600 + D PO) Take 1,200 mg by mouth at bedtime.     . furosemide (LASIX) 40 MG tablet Take 1 tablet (40 mg total) by mouth daily. 30 tablet 11  . lovastatin (MEVACOR) 40 MG tablet take 1 tablet by mouth at bedtime 30 tablet 6  . Miconazole Nitrate 2 % POWD 1 application by Does not apply route daily as needed (for skin). Apply to stomach and under breasts    . montelukast (SINGULAIR) 10 MG tablet Take 1 tablet (10 mg total) by mouth at bedtime. 30 tablet 11  . pantoprazole (PROTONIX) 40 MG tablet Take 1 tablet (40 mg total) by mouth at bedtime. 90 tablet 2  . polyethylene glycol (MIRALAX / GLYCOLAX) packet Take 17 g by mouth daily. 14 each 0  . potassium chloride (K-DUR,KLOR-CON) 10 MEQ tablet Take 2 tablets (20 mEq total) by mouth daily. 60 tablet 11  . SPIRIVA HANDIHALER 18 MCG inhalation capsule inhale the contents of one capsule in the handihaler once daily 30 capsule 4   No current facility-administered medications for this visit.    Past Medical History  Diagnosis Date  . IBS (irritable bowel syndrome)   . COPD (chronic obstructive pulmonary disease)   . Asthma   . Lung nodule     RLL nodule-50mm stable 2006, April  2009, and June 2009  . Anemia   . Hyperlipemia   . Obesity   . Essential hypertension   . Sleep apnea     PT TOLD BORDERLINE-TRIED CPAP-DID NOT HELP-SHE DOES NOT USE CPAP  . Arthritis   . History of radiation therapy 05/30/11 to 07/07/11    rectum  . History of pneumonia     several times  . History of shingles   . Rectal cancer 04/2011    T3N0; s/p lap LAR, s/p ileostomy, s/p reversal, s/p chemo  . Severe aortic valve stenosis  AVR 3/15 10/01/2013    mild-mod by echo 02/2014  . Rheumatic heart disease mitral stenosis     mod MS by echo 02/2014  . Anxiety   . S/P AVR (aortic valve  replacement) 2015    bioprosthetic ([facility])  . S/P MVR (mitral valve replacement) 2015    bioprosthetic ([facility])  . DDD (degenerative disc disease)     cervical - kyphosis with mod DD changes C5/6 and C6/7; lumbar - early DD at L2/3 ([name])  . Chronic diastolic heart failure 37/85/8850    Past Surgical History  Procedure Laterality Date  . Foot surgery  left foot    hammer toe  . Hand surgery  Bil    carpal tunnel  . Back surgery  2006, 2007    2006 SPACER, 2007 decompression and fusion L4/5  . Tonsillectomy    . Colon resection  08/25/2011    Procedure: COLON RESECTION LAPAROSCOPIC;  Surgeon: Diane Klein, MD;  Location: [facility];  Service: General;  Laterality: N/A;  Laparoscopic Assisted Low Anterior Resection Diverting Ostomy and onQ pain pump  . Ileostomy  08/25/2011  . Ileostomy closure  04/02/2012    Procedure: ILEOSTOMY TAKEDOWN;  Surgeon: Diane Klein, MD;  Location: [facility];  Service: General;  Laterality: N/A;  . Bowel resection  04/02/2012    Procedure: SMALL BOWEL RESECTION;  Surgeon: Diane Klein, MD;  Location: [facility];  Service: General;  Laterality: N/A;  . Carpal tunnel release    . Cardiac catheterization  11/27/2013  . Tee without cardioversion N/A 11/29/2013    Procedure: TRANSESOPHAGEAL ECHOCARDIOGRAM (TEE);  Surgeon: Diane Margarita, MD;  Location: [facility];  Service: Cardiovascular;  Laterality: N/A;  . Aortic valve replacement N/A 12/12/2013    Procedure: AORTIC VALVE REPLACEMENT (AVR);  Surgeon: Diane Pollack, MD; Service: Open Heart Surgery  . Mitral valve replacement N/A 12/12/2013    Procedure: MITRAL VALVE (MV) REPLACEMENT OR REPAIR;  Surgeon: Diane Pollack, MD; Service: Open Heart Surgery  . Intraoperative transesophageal echocardiogram N/A 12/12/2013    Procedure: INTRAOPERATIVE TRANSESOPHAGEAL ECHOCARDIOGRAM;  Surgeon: Diane Pollack, MD;  Location: [facility];  Service: Open Heart Surgery;  Laterality: N/A;  . Colonoscopy  03/2013    1 polyp, rpt 3 yrs  Ardis Hughs)  . Left and right heart catheterization with coronary angiogram N/A 11/27/2013    Procedure: LEFT AND RIGHT HEART CATHETERIZATION WITH CORONARY ANGIOGRAM;  Surgeon: Sanda Klein, MD;  Location: [facility];  Service: Cardiovascular;  Laterality: N/A;    Family History  Problem Relation Age of Onset  . Cancer Father     lung  . Heart disease Mother   . Diabetes Mother   . Hypertension Mother   . Colon cancer Neg Hx   . Esophageal cancer Neg Hx   . Stomach cancer Neg Hx   . Rectal cancer Neg Hx     History   Social History  .  Marital Status: Married    Spouse Name: N/A    Number of Children: N/A  . Years of Education: N/A   Occupational History  . retired    Social History Main Topics  . Smoking status: Former Smoker -- 1.50 packs/day for 10 years    Types: Cigarettes    Quit date: 12/27/1974  . Smokeless tobacco: Never Used  . Alcohol Use: No     Comment: rare  . Drug Use: No  . Sexual Activity: No   Other Topics Concern  . Not on file   Social History Narrative   Married 1966   2 sons; 1 grandchild   Retired-[employer]   Doing ok   March '09-financial tough times    Review of systems: The patient specifically denies any new chest pain at rest or with exertion, dyspnea at rest, orthopnea, paroxysmal nocturnal dyspnea, syncope, palpitations, focal neurological deficits, intermittent claudication, lower extremity edema, unexplained weight gain, cough, hemoptysis or wheezing.  The patient also denies abdominal pain, nausea, vomiting, dysphagia, diarrhea, constipation, polyuria, polydipsia, dysuria, hematuria, frequency, urgency, abnormal bleeding or bruising, fever, chills, unexpected weight changes, mood swings, change in skin or hair texture, change in voice quality, auditory or visual problems, allergic reactions or rashes, new musculoskeletal complaints other than usual "aches and pains".  PHYSICAL EXAM BP 126/72 mmHg  Pulse 76  Resp 16  Ht 5\' 8"  (1.727  m)  Wt 216 lb 6.4 oz (98.158 kg)  BMI 32.91 kg/m2 General: Alert, oriented x3, no distress  Head: no evidence of trauma, PERRL, EOMI, no exophtalmos or lid lag, no myxedema, no xanthelasma; normal ears, nose and oropharynx  Neck: normal jugular venous pulsations and no hepatojugular reflux; brisk carotid pulses without delay and no carotid bruits  Chest: Well-healed sternotomy scar clear to auscultation, no signs of consolidation by percussion or palpation, normal fremitus, symmetrical and full respiratory excursions  Cardiovascular: normal position and quality of the apical impulse, regular rhythm, normal first and second heart sounds, or prosthetic valve clicks, grade 7-6/1 systolic ejection murmur at the aortic focus is early peaking, no diastolic murmurs, rubs or gallops  Abdomen: no tenderness or distention, no masses by palpation, no abnormal pulsatility or arterial bruits, normal bowel sounds, no hepatosplenomegaly  Extremities: no clubbing, cyanosis or edema; 2+ radial, ulnar and brachial pulses bilaterally; 2+ right femoral, posterior tibial and dorsalis pedis pulses; 2+ left femoral, posterior tibial and dorsalis pedis pulses; no subclavian or femoral bruits  Neurological: grossly nonfocal  Lipid Panel     Component Value Date/Time   CHOL 181 07/05/2013 0850   TRIG 117 07/05/2013 0850   HDL 51 07/05/2013 0850   CHOLHDL 3.5 07/05/2013 0850   VLDL 23 07/05/2013 0850   LDLCALC 107* 07/05/2013 0850    BMET    Component Value Date/Time   NA 141 08/25/2014 1415   NA 143 07/28/2014 0848   NA 142 03/15/2012 0923   K 4.2 08/25/2014 1415   K 3.6 07/28/2014 0848   K 4.6 03/15/2012 0923   CL 102 08/25/2014 1415   CL 104 11/15/2012 0908   CL 103 03/15/2012 0923   CO2 27 08/25/2014 1415   CO2 30* 07/28/2014 0848   CO2 29 03/15/2012 0923   GLUCOSE 96 08/25/2014 1415   GLUCOSE 96 07/28/2014 0848   GLUCOSE 93 11/15/2012 0908   GLUCOSE 105 03/15/2012 0923   BUN 23 08/25/2014  1415   BUN 19.7 07/28/2014 0848   BUN 18 03/15/2012 0923   CREATININE  1.15* 08/25/2014 1415   CREATININE 1.2* 07/28/2014 0848   CREATININE 1.21* 05/12/2014 0947   CALCIUM 10.0 08/25/2014 1415   CALCIUM 10.0 07/28/2014 0848   CALCIUM 9.0 03/15/2012 0923   GFRNONAA 47* 08/25/2014 1415   GFRAA 55* 08/25/2014 1415     ASSESSMENT AND PLAN I am not at all sure I understand the cause of her recent chest pain, but a very thorough workup was unrevealing. 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 May have been GI in origin.  S/P aortic and mitral valve bioprostheses - 12/2013 She has a relatively small 21 mm aortic/25 mm mitral valve biological prosthesis and may have some degree of patient valve mismatch that could contribute to her dyspnea. The echo in August 2015 showed favorable gradients, but the recent study showed a mean mitral gradient of 11 mm Hg. Unfortunately, the heart rate was not documented in the report, making it very hard to do a true comparison.  Important to avoid tachycardia, since the mitral prosthesis is intrinsically stenotic.  The September cardiopulmonary stress test suggested that deconditioning/obesity are the most important contributing factors to her dyspnea.  Patient Instructions  Dr. Sallyanne Kuster recommends that you schedule a follow-up appointment in: 6 Months.      Holli Humbles, MD, [place] [phone] office [phone] pager

## 2014-09-23 ENCOUNTER — Ambulatory Visit: Payer: Medicare Other | Admitting: Physical Therapy

## 2014-09-23 ENCOUNTER — Encounter (HOSPITAL_COMMUNITY)
Admission: RE | Admit: 2014-09-23 | Discharge: 2014-09-23 | Disposition: A | Discharge status: Home / Self Care | Payer: Self-pay | Source: Ambulatory Visit | Attending: Internal Medicine | Admitting: Internal Medicine

## 2014-09-23 DIAGNOSIS — N8189 Other female genital prolapse: Secondary | ICD-10-CM | POA: Diagnosis not present

## 2014-09-25 ENCOUNTER — Encounter (HOSPITAL_COMMUNITY)
Admission: RE | Admit: 2014-09-25 | Discharge: 2014-09-25 | Disposition: A | Discharge status: Home / Self Care | Payer: Self-pay | Source: Ambulatory Visit | Attending: Internal Medicine | Admitting: Internal Medicine

## 2014-09-30 ENCOUNTER — Ambulatory Visit: Payer: Medicare Other | Admitting: Physical Therapy

## 2014-09-30 ENCOUNTER — Encounter (HOSPITAL_COMMUNITY)
Admission: RE | Admit: 2014-09-30 | Discharge: 2014-09-30 | Disposition: A | Discharge status: Home / Self Care | Payer: Self-pay | Source: Ambulatory Visit | Attending: Internal Medicine | Admitting: Internal Medicine

## 2014-09-30 DIAGNOSIS — N8189 Other female genital prolapse: Secondary | ICD-10-CM | POA: Diagnosis not present

## 2014-10-02 ENCOUNTER — Encounter (HOSPITAL_COMMUNITY): Payer: Self-pay

## 2014-10-07 ENCOUNTER — Encounter (HOSPITAL_COMMUNITY)
Admission: RE | Admit: 2014-10-07 | Discharge: 2014-10-07 | Disposition: A | Discharge status: Home / Self Care | Payer: Self-pay | Source: Ambulatory Visit | Attending: Internal Medicine | Admitting: Internal Medicine

## 2014-10-07 ENCOUNTER — Ambulatory Visit: Payer: Medicare Other | Admitting: Physical Therapy

## 2014-10-07 DIAGNOSIS — N8189 Other female genital prolapse: Secondary | ICD-10-CM | POA: Diagnosis not present

## 2014-10-08 ENCOUNTER — Other Ambulatory Visit: Payer: Self-pay | Admitting: Family Medicine

## 2014-10-08 ENCOUNTER — Other Ambulatory Visit (INDEPENDENT_AMBULATORY_CARE_PROVIDER_SITE_OTHER): Payer: Medicare Other

## 2014-10-08 DIAGNOSIS — I10 Essential (primary) hypertension: Secondary | ICD-10-CM

## 2014-10-08 DIAGNOSIS — N289 Disorder of kidney and ureter, unspecified: Secondary | ICD-10-CM

## 2014-10-08 DIAGNOSIS — E785 Hyperlipidemia, unspecified: Secondary | ICD-10-CM

## 2014-10-08 LAB — LIPID PANEL
Cholesterol: 214 mg/dL — ABNORMAL HIGH (ref 0–200)
HDL: 46.6 mg/dL (ref >39.00)
LDL Cholesterol: 140 mg/dL — ABNORMAL HIGH (ref 0–99)
NonHDL: 167.4
Total CHOL/HDL Ratio: 5
Triglycerides: 137 mg/dL (ref 0.0–149.0)
VLDL: 27.4 mg/dL (ref 0.0–40.0)

## 2014-10-08 LAB — RENAL FUNCTION PANEL
Albumin: 3.7 g/dL (ref 3.5–5.2)
BUN: 16 mg/dL (ref 6–23)
CO2: 27 mEq/L (ref 19–32)
Calcium: 9.1 mg/dL (ref 8.4–10.5)
Chloride: 106 mEq/L (ref 96–112)
Creatinine, Ser: 1 mg/dL (ref 0.4–1.2)
GFR: 58.38 mL/min — ABNORMAL LOW (ref >60.00)
Glucose, Bld: 85 mg/dL (ref 70–99)
Phosphorus: 4.1 mg/dL (ref 2.3–4.6)
Potassium: 4 mEq/L (ref 3.5–5.1)
Sodium: 141 mEq/L (ref 135–145)

## 2014-10-08 LAB — VITAMIN D 25 HYDROXY (VIT D DEFICIENCY, FRACTURES): VITD: 8.81 ng/mL — ABNORMAL LOW (ref 30.00–100.00)

## 2014-10-09 ENCOUNTER — Encounter (HOSPITAL_COMMUNITY)
Admission: RE | Admit: 2014-10-09 | Discharge: 2014-10-09 | Disposition: A | Discharge status: Home / Self Care | Payer: Self-pay | Source: Ambulatory Visit | Attending: Internal Medicine | Admitting: Internal Medicine

## 2014-10-09 ENCOUNTER — Telehealth: Payer: Self-pay | Admitting: Family Medicine

## 2014-10-09 NOTE — Telephone Encounter (Signed)
emmi emailed °

## 2014-10-14 ENCOUNTER — Encounter (HOSPITAL_COMMUNITY): Payer: Self-pay | Attending: Internal Medicine

## 2014-10-14 DIAGNOSIS — Z954 Presence of other heart-valve replacement: Secondary | ICD-10-CM | POA: Insufficient documentation

## 2014-10-14 DIAGNOSIS — R0600 Dyspnea, unspecified: Secondary | ICD-10-CM | POA: Insufficient documentation

## 2014-10-15 ENCOUNTER — Encounter: Payer: Self-pay | Admitting: Family Medicine

## 2014-10-15 ENCOUNTER — Ambulatory Visit (INDEPENDENT_AMBULATORY_CARE_PROVIDER_SITE_OTHER): Payer: 59 | Admitting: Family Medicine

## 2014-10-15 VITALS — BP 126/76 | HR 76 | Temp 97.9°F | Ht 65.5 in | Wt 212.5 lb

## 2014-10-15 DIAGNOSIS — Z0000 Encounter for general adult medical examination without abnormal findings: Secondary | ICD-10-CM | POA: Insufficient documentation

## 2014-10-15 DIAGNOSIS — C20 Malignant neoplasm of rectum: Secondary | ICD-10-CM

## 2014-10-15 DIAGNOSIS — Z952 Presence of prosthetic heart valve: Secondary | ICD-10-CM

## 2014-10-15 DIAGNOSIS — J449 Chronic obstructive pulmonary disease, unspecified: Secondary | ICD-10-CM

## 2014-10-15 DIAGNOSIS — E785 Hyperlipidemia, unspecified: Secondary | ICD-10-CM

## 2014-10-15 DIAGNOSIS — E2839 Other primary ovarian failure: Secondary | ICD-10-CM

## 2014-10-15 DIAGNOSIS — I10 Essential (primary) hypertension: Secondary | ICD-10-CM

## 2014-10-15 DIAGNOSIS — F4321 Adjustment disorder with depressed mood: Secondary | ICD-10-CM

## 2014-10-15 DIAGNOSIS — F4323 Adjustment disorder with mixed anxiety and depressed mood: Secondary | ICD-10-CM | POA: Insufficient documentation

## 2014-10-15 HISTORY — DX: Adjustment disorder with depressed mood: F43.21

## 2014-10-15 NOTE — Assessment & Plan Note (Signed)
Mild deterioration today on labwork today but no changes made. Recheck in 6 mo and if persistently elevated LDL will consider more potent statin.

## 2014-10-15 NOTE — Progress Notes (Signed)
BP 126/76 mmHg  Pulse 76  Temp(Src) 97.9 F (36.6 C) (Oral)  Ht 5' 5.5" (1.664 m)  Wt 212 lb 8 oz (96.389 kg)  BMI 34.81 kg/m2   CC: medicare wellness visit  Subjective:    Patient ID: [name], female    DOB: 30-Sep-1946, 69 y.o.   MRN: [id number]  HPI: [name] is a 69 y.o. female presenting on 10/15/2014 for Annual Exam   79yo son died last week - presumed MI.   S/p aortic and mitral valve bioprosthesis 12/2013 ([facility]). Remains dyspneic with exertion. Continues attending pulm rehab twice weekly and she finds this is helpful. Recent hospitalization for chest pain with unexplained etiology ?pericarditis. Dyspnea attributed to obesity/deconditioning.   COPD/asthma - on breo, spiriva, albuterol prn (rare use). Followed by Dr Lynford Citizen.   H/o rectal cancer s/p latest colonoscopy 03/2014 Ardis Hughs). rec rpt 3 yrs.   Some trouble with hearing screen today at higher frequencies. Vision screen with eye doctor 07/2014 1 fall in past year - nauseated and passed out during acute GI illness. Denies anhedonia or depression  Preventative: COLONOSCOPY Date: 03/2013 1 polyp, rpt 3 yrs Ardis Hughs) Well woman - none recently. Had normal paps in past and would like to age out of cervical cancer screening. Discussed other GYN cancers.  Mammogram 01/2014 WNL.  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 HCPOA is husband then son.  Lives with husband and 1 cat.  2 sons; 1 grandchild Married 1966 Retired-[employer], volunteers at [employer] Activity: pulm rehab Diet: some water, fruits/vegetables daily  Relevant past medical, surgical, family and social history reviewed and updated as indicated. Interim medical history since our last visit reviewed. Allergies and medications reviewed and updated. Current Outpatient Prescriptions on File Prior to Visit    Medication Sig  . albuterol (PROVENTIL HFA;VENTOLIN HFA) 108 (90 BASE) MCG/ACT inhaler Inhale 2 puffs into the lungs every 6 (six) hours as needed for wheezing.  Marland Kitchen aspirin 81 MG tablet Take 81 mg by mouth at bedtime.   Marland Kitchen BIOTIN PO Take 1 tablet by mouth at bedtime.   Marland Kitchen BREO ELLIPTA 100-25 MCG/INH AEPB inhale 1 puff by mouth once daily  . Calcium Carbonate-Vitamin D (CALCIUM 600 + D PO) Take 1,200 mg by mouth at bedtime.   . furosemide (LASIX) 40 MG tablet Take 1 tablet (40 mg total) by mouth daily.  Marland Kitchen lovastatin (MEVACOR) 40 MG tablet take 1 tablet by mouth at bedtime  . Miconazole Nitrate 2 % POWD 1 application by Does not apply route daily as needed (for skin). Apply to stomach and under breasts  . montelukast (SINGULAIR) 10 MG tablet Take 1 tablet (10 mg total) by mouth at bedtime.  . pantoprazole (PROTONIX) 40 MG tablet Take 1 tablet (40 mg total) by mouth at bedtime.  . polyethylene glycol (MIRALAX / GLYCOLAX) packet Take 17 g by mouth daily.  . potassium chloride (K-DUR,KLOR-CON) 10 MEQ tablet Take 2 tablets (20 mEq total) by mouth daily.  Marland Kitchen SPIRIVA HANDIHALER 18 MCG inhalation capsule inhale the contents of one capsule in the handihaler once daily   No current facility-administered medications on file prior to visit.    Review of Systems  Constitutional: Negative for fever, chills, activity change, appetite change, fatigue and unexpected weight change.  HENT: Negative for hearing loss.   Eyes: Negative for visual disturbance.  Respiratory: Positive for  shortness of breath and wheezing (occasional). Negative for cough and chest tightness.   Cardiovascular: Negative for chest pain, palpitations and leg swelling.  Gastrointestinal: Negative for nausea, vomiting, abdominal pain, diarrhea, constipation, blood in stool and abdominal distention.       Gassiness improved after she saw Adam Phenix pelvic physical therapist  Genitourinary: Negative for hematuria and difficulty urinating.   Musculoskeletal: Negative for myalgias, arthralgias and neck pain.  Skin: Negative for rash.  Neurological: Negative for dizziness, seizures, syncope and headaches.  Hematological: Negative for adenopathy. Does not bruise/bleed easily.  Psychiatric/Behavioral: Negative for dysphoric mood. The patient is not nervous/anxious.        Mourning   Per HPI unless specifically indicated above     Objective:    BP 126/76 mmHg  Pulse 76  Temp(Src) 97.9 F (36.6 C) (Oral)  Ht 5' 5.5" (1.664 m)  Wt 212 lb 8 oz (96.389 kg)  BMI 34.81 kg/m2  Wt Readings from Last 3 Encounters:  10/15/14 212 lb 8 oz (96.389 kg)  09/17/14 216 lb 6.4 oz (98.158 kg)  08/27/14 217 lb 4.8 oz (98.567 kg)    Physical Exam  Constitutional: She is oriented to person, place, and time. She appears well-developed and well-nourished. No distress.  HENT:  Head: Normocephalic and atraumatic.  Right Ear: Hearing, tympanic membrane, external ear and ear canal normal.  Left Ear: Hearing, tympanic membrane, external ear and ear canal normal.  Nose: Nose normal.  Mouth/Throat: Uvula is midline, oropharynx is clear and moist and mucous membranes are normal. No oropharyngeal exudate, posterior oropharyngeal edema or posterior oropharyngeal erythema.  Eyes: Conjunctivae and EOM are normal. Pupils are equal, round, and reactive to light. No scleral icterus.  Neck: Normal range of motion. Neck supple. Carotid bruit is not present. No thyromegaly present.  Cardiovascular: Normal rate, regular rhythm and intact distal pulses.   Murmur (systolic valve murmur present) heard. Pulses:      Radial pulses are 2+ on the right side, and 2+ on the left side.  Pulmonary/Chest: Effort normal. No respiratory distress. She has wheezes (faint exp diffusely). She has no rales.  Breast exam deferred per pt request  Abdominal: Soft. Bowel sounds are normal. She exhibits no distension and no mass. There is no tenderness. There is no rebound and no  guarding.  Genitourinary:  GYN exam deferred per pt request  Musculoskeletal: Normal range of motion. She exhibits no edema.  Lymphadenopathy:    She has no cervical adenopathy.  Neurological: She is alert and oriented to person, place, and time.  CN grossly intact, station and gait intact Recall 3/3 Calculation 5/5 D-L-R-O-W  Skin: Skin is warm and dry. No rash noted.  Psychiatric: She has a normal mood and affect. Her behavior is normal. Judgment and thought content normal.  Nursing note and vitals reviewed.  Results for orders placed or performed in visit on 10/08/14  Lipid panel  Result Value Ref Range   Cholesterol 214 (H) 0 - 200 mg/dL   Triglycerides 137.0 0.0 - 149.0 mg/dL   HDL 46.60 >39.00 mg/dL   VLDL 27.4 0.0 - 40.0 mg/dL   LDL Cholesterol 140 (H) 0 - 99 mg/dL   Total CHOL/HDL Ratio 5    NonHDL 167.40   Renal function panel  Result Value Ref Range   Sodium 141 135 - 145 mEq/L   Potassium 4.0 3.5 - 5.1 mEq/L   Chloride 106 96 - 112 mEq/L   CO2 27 19 - 32 mEq/L  Calcium 9.1 8.4 - 10.5 mg/dL   Albumin 3.7 3.5 - 5.2 g/dL   BUN 16 6 - 23 mg/dL   Creatinine, Ser 1.0 0.4 - 1.2 mg/dL   Glucose, Bld 85 70 - 99 mg/dL   Phosphorus 4.1 2.3 - 4.6 mg/dL   GFR 58.38 (L) >60.00 mL/min  Vit D  25 hydroxy (rtn osteoporosis monitoring)  Result Value Ref Range   VITD 8.81 (L) 30.00 - 100.00 ng/mL      Assessment & Plan:   Problem List Items Addressed This Visit    S/P aortic and mitral valve bioprostheses - 12/2013    Stable period    Rectal cancer, midrectum ypT3ypN0, s/p neoadj chemoXRT, lap LAR with diverting loop ileostomy 08/2011, ileostomy takedown 03/2012    Followed closely by onc and surg.    Morbid obesity    Body mass index is 34.81 kg/(m^2).  Continue to participate in pulm rehab. Reviewed healthy diet choices    Medicare annual wellness visit, initial - Primary    I have personally reviewed the Medicare Annual Wellness questionnaire and have noted 1. The  patient's medical and social history 2. Their use of alcohol, tobacco or illicit drugs 3. Their current medications and supplements 4. The patient's functional ability including ADL's, fall risks, home safety risks and hearing or visual impairment. 5. Diet and physical activity 6. Evidence for depression or mood disorders The patients weight, height, BMI have been recorded in the chart.  Hearing and vision has been addressed. I have made referrals, counseling and provided education to the patient based review of the above and I have provided the pt with a written personalized care plan for preventive services. Provider list updated - see scanned questionairre. Reviewed preventative protocols and updated unless pt declined.     HLD (hyperlipidemia)    Mild deterioration today on labwork today but no changes made. Recheck in 6 mo and if persistently elevated LDL will consider more potent statin.    Health maintenance examination    Preventative protocols reviewed and updated unless pt declined. Discussed healthy diet and lifestyle.     Grieving    Support provided    Essential hypertension, benign    Chronic, stable. Continue regimen.    COPD (chronic obstructive pulmonary disease)    Chronic, stable. Continue regimen.     Other Visit Diagnoses    Estrogen deficiency        Relevant Orders       DG Bone Density        Follow up plan: Return in about 6 months (around 04/15/2015), or as needed, for follow up visit.

## 2014-10-15 NOTE — Assessment & Plan Note (Signed)
Stable period.  

## 2014-10-15 NOTE — Assessment & Plan Note (Signed)
Support provided. 

## 2014-10-15 NOTE — Progress Notes (Signed)
Pre visit review using our clinic review tool, if applicable. No additional management support is needed unless otherwise documented below in the visit note. 

## 2014-10-15 NOTE — Assessment & Plan Note (Signed)
Followed closely by onc and surg.

## 2014-10-15 NOTE — Assessment & Plan Note (Signed)
Preventative protocols reviewed and updated unless pt declined. Discussed healthy diet and lifestyle.  

## 2014-10-15 NOTE — Assessment & Plan Note (Signed)

## 2014-10-15 NOTE — Assessment & Plan Note (Signed)
Chronic, stable. Continue regimen. 

## 2014-10-15 NOTE — Assessment & Plan Note (Signed)
Body mass index is 34.81 kg/(m^2).  Continue to participate in pulm rehab. Reviewed healthy diet choices

## 2014-10-15 NOTE — Patient Instructions (Addendum)
Bring [place] a copy of living will. We will schedule bone density scan and call you Change ibuprofen to tylenol 500mg   Make sure you buy calcium/vit D 600mg /400 unit dose and take twice daily. Take extra vitamin D 2000 units daily (both over the counter). Return in 6 months for follow up visit and lab visit (cholesterol and vitamin D)

## 2014-10-16 ENCOUNTER — Encounter (HOSPITAL_COMMUNITY): Payer: Self-pay

## 2014-10-16 ENCOUNTER — Encounter: Payer: Medicare Other | Admitting: Physical Therapy

## 2014-10-21 ENCOUNTER — Encounter (HOSPITAL_COMMUNITY): Payer: Self-pay

## 2014-10-21 ENCOUNTER — Ambulatory Visit: Payer: Medicare Other | Admitting: Physical Therapy

## 2014-10-23 ENCOUNTER — Encounter (HOSPITAL_COMMUNITY): Payer: Self-pay

## 2014-10-28 ENCOUNTER — Ambulatory Visit: Payer: Medicare Other | Attending: General Surgery | Admitting: Physical Therapy

## 2014-10-28 ENCOUNTER — Encounter (HOSPITAL_COMMUNITY): Payer: Self-pay

## 2014-10-28 DIAGNOSIS — N8189 Other female genital prolapse: Secondary | ICD-10-CM | POA: Diagnosis present

## 2014-10-30 ENCOUNTER — Encounter (HOSPITAL_COMMUNITY): Payer: Self-pay

## 2014-11-04 ENCOUNTER — Ambulatory Visit: Payer: Medicare Other | Admitting: Physical Therapy

## 2014-11-04 ENCOUNTER — Encounter (HOSPITAL_COMMUNITY): Payer: Self-pay

## 2014-11-04 DIAGNOSIS — N8189 Other female genital prolapse: Secondary | ICD-10-CM | POA: Diagnosis not present

## 2014-11-06 ENCOUNTER — Encounter (HOSPITAL_COMMUNITY): Payer: Self-pay

## 2014-11-10 DIAGNOSIS — K859 Acute pancreatitis without necrosis or infection, unspecified: Secondary | ICD-10-CM

## 2014-11-10 HISTORY — DX: Acute pancreatitis without necrosis or infection, unspecified: K85.90

## 2014-11-11 ENCOUNTER — Encounter (HOSPITAL_COMMUNITY): Payer: Self-pay

## 2014-11-11 ENCOUNTER — Ambulatory Visit: Payer: Medicare Other | Admitting: Physical Therapy

## 2014-11-11 DIAGNOSIS — R0600 Dyspnea, unspecified: Secondary | ICD-10-CM | POA: Insufficient documentation

## 2014-11-11 DIAGNOSIS — Z954 Presence of other heart-valve replacement: Secondary | ICD-10-CM | POA: Insufficient documentation

## 2014-11-13 ENCOUNTER — Encounter (HOSPITAL_COMMUNITY)
Admission: RE | Admit: 2014-11-13 | Discharge: 2014-11-13 | Disposition: A | Discharge status: Home / Self Care | Payer: Self-pay | Source: Ambulatory Visit | Attending: Internal Medicine | Admitting: Internal Medicine

## 2014-11-14 ENCOUNTER — Ambulatory Visit (INDEPENDENT_AMBULATORY_CARE_PROVIDER_SITE_OTHER): Payer: Medicare Other | Admitting: Internal Medicine

## 2014-11-14 ENCOUNTER — Encounter: Payer: Self-pay | Admitting: Internal Medicine

## 2014-11-14 VITALS — BP 160/84 | HR 90 | Temp 99.0°F | Resp 17 | Ht 66.0 in | Wt 216.5 lb

## 2014-11-14 DIAGNOSIS — J029 Acute pharyngitis, unspecified: Secondary | ICD-10-CM

## 2014-11-14 DIAGNOSIS — J441 Chronic obstructive pulmonary disease with (acute) exacerbation: Secondary | ICD-10-CM

## 2014-11-14 MED ORDER — IPRATROPIUM-ALBUTEROL 0.5-2.5 (3) MG/3ML IN SOLN
3.0000 mL | Freq: Four times a day (QID) | RESPIRATORY_TRACT | Status: DC | PRN
Start: 2014-11-14 — End: 2024-03-14

## 2014-11-14 MED ORDER — METHYLPREDNISOLONE 8 MG PO TABS: 16.0000 mg | ORAL_TABLET | Freq: Two times a day (BID) | ORAL | Status: DC

## 2014-11-14 MED ORDER — AZITHROMYCIN 250 MG PO TABS: ORAL_TABLET | ORAL | Status: DC

## 2014-11-14 NOTE — Progress Notes (Signed)
Pre visit review using our clinic review tool, if applicable. No additional management support is needed unless otherwise documented below in the visit note. 

## 2014-11-14 NOTE — Patient Instructions (Signed)
   Please use the nebulizer every 6 hours as needed; a new prescription has been sent to your pharmacy.

## 2014-11-14 NOTE — Progress Notes (Signed)
   Subjective:    Patient ID: [name], female    DOB: Jun 06, 1946, 69 y.o.   MRN: [id number]  HPI  Her symptoms began yesterday as  cough and scratchy throat. She has a sensation of some secretion retention in her throat but the cough remains nonproductive.  She is concerned as she has COPD and this is the manner in which her exacerbations start.  She's been using her Breo as well as Spiriva. She has an albuterol metered-dose inhaler. She has a nebulizer but does not have the agents for it and has not since March of 2015. She has no upper respiratory tract symptoms and no symptoms of reflux.  She has had hives while on oral prednisone but can take generic Medrol.  Review of Systems Frontal headache, facial pain , nasal purulence, dental pain,  otic pain or otic discharge denied. No fever , chills or sweats. Significant dyspepsia or dysphagia are denied.    Objective:   Physical Exam  Pertinent or  positive findings include: Initial pulse 103; repeat pulse 90. No significant change in BP @ recheck She appears somewhat chronically ill. She has a to and fro murmur at the base. She has diffuse low-grade wheezing in all lung fields There is accentuation of the curvature of the upper thoracic spine.  General appearance: No  lymphadenopathy about the head, neck, or axilla noted.  Eyes: No conjunctival inflammation or lid edema is present. There is no scleral icterus. Ears:  External ear exam shows no significant lesions or deformities.  Otoscopic examination reveals clear canals, tympanic membranes are intact bilaterally without bulging, retraction, inflammation or discharge. Nose:  External nasal examination shows no deformity or inflammation. Nasal mucosa are pink and moist without lesions or exudates. No septal dislocation or deviation.No obstruction to airflow.  Oral exam: Dental hygiene is good; lips and gums are healthy appearing.There is no oropharyngeal erythema or exudate  noted.  Neck:  No deformities, thyromegaly, masses, or tenderness noted.   Heart:  Regular rhythm. S1 and S2 normal without gallop, click, rub or other extra sounds.  Extremities:  No cyanosis, edema, or clubbing  noted  Skin: Warm & dry w/o jaundice or tenting.       Assessment & Plan:  #1 COPD exacerbation #2 sore throat with  Negative Centor criteria for Strep pharyngitis Plan: See orders recommendations

## 2014-11-16 ENCOUNTER — Inpatient Hospital Stay (HOSPITAL_COMMUNITY)
Admission: EM | Admit: 2014-11-16 | Discharge: 2014-11-19 | DRG: 439 | Disposition: A | Discharge status: Home / Self Care | Payer: Medicare Other | Attending: Internal Medicine | Admitting: Internal Medicine

## 2014-11-16 ENCOUNTER — Encounter (HOSPITAL_COMMUNITY): Payer: Self-pay | Admitting: Cardiology

## 2014-11-16 ENCOUNTER — Observation Stay (HOSPITAL_COMMUNITY): Payer: Medicare Other

## 2014-11-16 ENCOUNTER — Emergency Department (HOSPITAL_COMMUNITY): Payer: Medicare Other

## 2014-11-16 DIAGNOSIS — K589 Irritable bowel syndrome without diarrhea: Secondary | ICD-10-CM | POA: Diagnosis present

## 2014-11-16 DIAGNOSIS — J45909 Unspecified asthma, uncomplicated: Secondary | ICD-10-CM | POA: Diagnosis present

## 2014-11-16 DIAGNOSIS — E669 Obesity, unspecified: Secondary | ICD-10-CM | POA: Diagnosis present

## 2014-11-16 DIAGNOSIS — R1013 Epigastric pain: Secondary | ICD-10-CM | POA: Diagnosis not present

## 2014-11-16 DIAGNOSIS — J209 Acute bronchitis, unspecified: Secondary | ICD-10-CM

## 2014-11-16 DIAGNOSIS — Z9049 Acquired absence of other specified parts of digestive tract: Secondary | ICD-10-CM | POA: Diagnosis present

## 2014-11-16 DIAGNOSIS — J449 Chronic obstructive pulmonary disease, unspecified: Secondary | ICD-10-CM | POA: Diagnosis present

## 2014-11-16 DIAGNOSIS — R109 Unspecified abdominal pain: Secondary | ICD-10-CM

## 2014-11-16 DIAGNOSIS — K828 Other specified diseases of gallbladder: Secondary | ICD-10-CM | POA: Diagnosis present

## 2014-11-16 DIAGNOSIS — E860 Dehydration: Secondary | ICD-10-CM | POA: Diagnosis present

## 2014-11-16 DIAGNOSIS — K859 Acute pancreatitis without necrosis or infection, unspecified: Secondary | ICD-10-CM

## 2014-11-16 DIAGNOSIS — D696 Thrombocytopenia, unspecified: Secondary | ICD-10-CM | POA: Diagnosis present

## 2014-11-16 DIAGNOSIS — Z923 Personal history of irradiation: Secondary | ICD-10-CM

## 2014-11-16 DIAGNOSIS — I5032 Chronic diastolic (congestive) heart failure: Secondary | ICD-10-CM | POA: Diagnosis present

## 2014-11-16 DIAGNOSIS — Z6834 Body mass index (BMI) 34.0-34.9, adult: Secondary | ICD-10-CM

## 2014-11-16 DIAGNOSIS — G4733 Obstructive sleep apnea (adult) (pediatric): Secondary | ICD-10-CM | POA: Diagnosis present

## 2014-11-16 DIAGNOSIS — C20 Malignant neoplasm of rectum: Secondary | ICD-10-CM | POA: Diagnosis present

## 2014-11-16 DIAGNOSIS — R079 Chest pain, unspecified: Secondary | ICD-10-CM

## 2014-11-16 DIAGNOSIS — J440 Chronic obstructive pulmonary disease with acute lower respiratory infection: Secondary | ICD-10-CM | POA: Diagnosis present

## 2014-11-16 DIAGNOSIS — I10 Essential (primary) hypertension: Secondary | ICD-10-CM | POA: Diagnosis present

## 2014-11-16 DIAGNOSIS — N179 Acute kidney failure, unspecified: Secondary | ICD-10-CM | POA: Diagnosis present

## 2014-11-16 DIAGNOSIS — F419 Anxiety disorder, unspecified: Secondary | ICD-10-CM | POA: Diagnosis present

## 2014-11-16 DIAGNOSIS — Z7982 Long term (current) use of aspirin: Secondary | ICD-10-CM

## 2014-11-16 DIAGNOSIS — R1010 Upper abdominal pain, unspecified: Secondary | ICD-10-CM

## 2014-11-16 DIAGNOSIS — Z952 Presence of prosthetic heart valve: Secondary | ICD-10-CM

## 2014-11-16 DIAGNOSIS — M1990 Unspecified osteoarthritis, unspecified site: Secondary | ICD-10-CM | POA: Diagnosis present

## 2014-11-16 DIAGNOSIS — R932 Abnormal findings on diagnostic imaging of liver and biliary tract: Secondary | ICD-10-CM | POA: Insufficient documentation

## 2014-11-16 DIAGNOSIS — Z85048 Personal history of other malignant neoplasm of rectum, rectosigmoid junction, and anus: Secondary | ICD-10-CM

## 2014-11-16 DIAGNOSIS — Z87891 Personal history of nicotine dependence: Secondary | ICD-10-CM

## 2014-11-16 DIAGNOSIS — E785 Hyperlipidemia, unspecified: Secondary | ICD-10-CM | POA: Diagnosis present

## 2014-11-16 HISTORY — DX: Partial intestinal obstruction, unspecified as to cause: K56.600

## 2014-11-16 LAB — HEPATIC FUNCTION PANEL
ALT: 208 U/L — ABNORMAL HIGH (ref 0–35)
AST: 465 U/L — ABNORMAL HIGH (ref 0–37)
Albumin: 3.3 g/dL — ABNORMAL LOW (ref 3.5–5.2)
Alkaline Phosphatase: 133 U/L — ABNORMAL HIGH (ref 39–117)
Bilirubin, Direct: 0.4 mg/dL (ref 0.0–0.5)
Indirect Bilirubin: 0.6 mg/dL (ref 0.3–0.9)
Total Bilirubin: 1 mg/dL (ref 0.3–1.2)
Total Protein: 6.3 g/dL (ref 6.0–8.3)

## 2014-11-16 LAB — BASIC METABOLIC PANEL
Anion gap: 8 (ref 5–15)
BUN: 25 mg/dL — ABNORMAL HIGH (ref 6–23)
CO2: 30 mmol/L (ref 19–32)
Calcium: 9.7 mg/dL (ref 8.4–10.5)
Chloride: 103 mmol/L (ref 96–112)
Creatinine, Ser: 1.29 mg/dL — ABNORMAL HIGH (ref 0.50–1.10)
GFR calc Af Amer: 48 mL/min — ABNORMAL LOW (ref >90)
GFR calc non Af Amer: 41 mL/min — ABNORMAL LOW (ref >90)
Glucose, Bld: 142 mg/dL — ABNORMAL HIGH (ref 70–99)
Potassium: 4.2 mmol/L (ref 3.5–5.1)
Sodium: 141 mmol/L (ref 135–145)

## 2014-11-16 LAB — CBC
HCT: 37.4 % (ref 36.0–46.0)
Hemoglobin: 12.1 g/dL (ref 12.0–15.0)
MCH: 28.8 pg (ref 26.0–34.0)
MCHC: 32.4 g/dL (ref 30.0–36.0)
MCV: 89 fL (ref 78.0–100.0)
Platelets: 173 10*3/uL (ref 150–400)
RBC: 4.2 MIL/uL (ref 3.87–5.11)
RDW: 14.4 % (ref 11.5–15.5)
WBC: 15.4 10*3/uL — ABNORMAL HIGH (ref 4.0–10.5)

## 2014-11-16 LAB — I-STAT TROPONIN, ED: Troponin i, poc: 0.04 ng/mL (ref 0.00–0.08)

## 2014-11-16 LAB — BRAIN NATRIURETIC PEPTIDE: B Natriuretic Peptide: 211 pg/mL — ABNORMAL HIGH (ref 0.0–100.0)

## 2014-11-16 LAB — TROPONIN I: Troponin I: 0.09 ng/mL — ABNORMAL HIGH (ref <0.031)

## 2014-11-16 LAB — LIPASE, BLOOD: Lipase: 1377 U/L — ABNORMAL HIGH (ref 11–59)

## 2014-11-16 LAB — LACTATE DEHYDROGENASE: LDH: 364 U/L — ABNORMAL HIGH (ref 94–250)

## 2014-11-16 MED ORDER — SODIUM CHLORIDE 0.9 % IV SOLN
INTRAVENOUS | Status: DC
  Administered 2014-11-17 – 2014-11-18 (×3): via INTRAVENOUS

## 2014-11-16 MED ORDER — MONTELUKAST SODIUM 10 MG PO TABS
10.0000 mg | ORAL_TABLET | Freq: Every day | ORAL | Status: DC
  Administered 2014-11-17 – 2014-11-18 (×3): 10 mg via ORAL
  Filled 2014-11-16 (×5): qty 1

## 2014-11-16 MED ORDER — FLUTICASONE FUROATE-VILANTEROL 100-25 MCG/INH IN AEPB
1.0000 | INHALATION_SPRAY | Freq: Every day | RESPIRATORY_TRACT | Status: DC
  Administered 2014-11-18 – 2014-11-19 (×2): 1 via RESPIRATORY_TRACT

## 2014-11-16 MED ORDER — ENOXAPARIN SODIUM 30 MG/0.3ML ~~LOC~~ SOLN
30.0000 mg | SUBCUTANEOUS | Status: DC
Start: 2014-11-17 — End: 2014-11-17
  Filled 2014-11-16: qty 0.3

## 2014-11-16 MED ORDER — IOHEXOL 300 MG/ML  SOLN
25.0000 mL | Freq: Once | INTRAMUSCULAR | Status: AC | PRN
  Administered 2014-11-16: 25 mL via ORAL

## 2014-11-16 MED ORDER — PANTOPRAZOLE SODIUM 40 MG PO TBEC
40.0000 mg | DELAYED_RELEASE_TABLET | Freq: Every day | ORAL | Status: DC
  Administered 2014-11-17 – 2014-11-18 (×2): 40 mg via ORAL
  Filled 2014-11-16 (×2): qty 1

## 2014-11-16 MED ORDER — HYDROMORPHONE HCL 1 MG/ML IJ SOLN: 0.5000 mg | INTRAMUSCULAR | Status: DC | PRN

## 2014-11-16 MED ORDER — IOHEXOL 300 MG/ML  SOLN
100.0000 mL | Freq: Once | INTRAMUSCULAR | Status: AC | PRN
  Administered 2014-11-16: 100 mL via INTRAVENOUS

## 2014-11-16 MED ORDER — METHYLPREDNISOLONE 4 MG PO TABS
4.0000 mg | ORAL_TABLET | Freq: Two times a day (BID) | ORAL | Status: AC
  Administered 2014-11-17 (×2): 4 mg via ORAL
  Filled 2014-11-16 (×2): qty 1

## 2014-11-16 MED ORDER — SODIUM CHLORIDE 0.9 % IJ SOLN
3.0000 mL | Freq: Two times a day (BID) | INTRAMUSCULAR | Status: DC
  Administered 2014-11-17 – 2014-11-18 (×2): 3 mL via INTRAVENOUS

## 2014-11-16 MED ORDER — ASPIRIN EC 81 MG PO TBEC
81.0000 mg | DELAYED_RELEASE_TABLET | Freq: Every day | ORAL | Status: DC
  Administered 2014-11-17 – 2014-11-18 (×3): 81 mg via ORAL
  Filled 2014-11-16 (×5): qty 1

## 2014-11-16 MED ORDER — IPRATROPIUM-ALBUTEROL 0.5-2.5 (3) MG/3ML IN SOLN
3.0000 mL | Freq: Four times a day (QID) | RESPIRATORY_TRACT | Status: DC | PRN
  Administered 2014-11-17: 3 mL via RESPIRATORY_TRACT
  Filled 2014-11-16: qty 3

## 2014-11-16 MED ORDER — TIOTROPIUM BROMIDE MONOHYDRATE 18 MCG IN CAPS
18.0000 ug | ORAL_CAPSULE | Freq: Every day | RESPIRATORY_TRACT | Status: DC
  Administered 2014-11-17 – 2014-11-19 (×3): 18 ug via RESPIRATORY_TRACT
  Filled 2014-11-16: qty 5

## 2014-11-16 MED ORDER — ONDANSETRON HCL 4 MG PO TABS: 4.0000 mg | ORAL_TABLET | Freq: Four times a day (QID) | ORAL | Status: DC | PRN

## 2014-11-16 MED ORDER — AZITHROMYCIN 250 MG PO TABS
250.0000 mg | ORAL_TABLET | Freq: Every day | ORAL | Status: DC
  Filled 2014-11-16: qty 1

## 2014-11-16 MED ORDER — TRAMADOL HCL 50 MG PO TABS
50.0000 mg | ORAL_TABLET | Freq: Four times a day (QID) | ORAL | Status: DC | PRN
  Administered 2014-11-19: 50 mg via ORAL
  Filled 2014-11-16: qty 1

## 2014-11-16 MED ORDER — ALBUTEROL SULFATE HFA 108 (90 BASE) MCG/ACT IN AERS: 2.0000 | INHALATION_SPRAY | Freq: Four times a day (QID) | RESPIRATORY_TRACT | Status: DC | PRN

## 2014-11-16 MED ORDER — ONDANSETRON HCL 4 MG/2ML IJ SOLN: 4.0000 mg | Freq: Four times a day (QID) | INTRAMUSCULAR | Status: DC | PRN

## 2014-11-16 NOTE — ED Notes (Signed)
Patient transported to X-ray 

## 2014-11-16 NOTE — ED Provider Notes (Signed)
1700 - Care from Dr. Ashok Cordia. 25F here with acute onset epigastric pain, no N/V/D, radiated through to her back. Initial troponin normal. Awaiting 2nd troponin and hepatic function panel and lipase. She reported similar presentation last year, was diagnosed with pericarditis. Hx of rectal cancer in 2012. Patient's labs return with 2nd troponin mildly elevated at 0.09. Lipase elevaetd at 1377, and AST/ALT elevated.  2/5 Ranson criteria met with older age, elevated AST. LDH sent.  With elevated troponin, lab evidence of pancreatitis, will obtain CT scan. No RUQ pain on palpation. EKG without diffuse ST elevation, doubt pericarditis at this time.  Will consult [facility] for admission.  Evelina Bucy, MD 11/16/14 [phone]

## 2014-11-16 NOTE — ED Provider Notes (Signed)
CSN: [id number]     Arrival date & time 11/16/14  1420 History   First MD Initiated Contact with Patient 11/16/14 1506     Chief Complaint  Patient presents with  . Chest Pain     (Consider location/radiation/quality/duration/timing/severity/associated sxs/prior Treatment) Patient is a 69 y.o. female presenting with chest pain. The history is provided by the patient.  Chest Pain Associated symptoms: cough   Associated symptoms: no abdominal pain, no back pain, no fever, no headache, no nausea, no shortness of breath and not vomiting   pt with hx rheumatic heart disease, prior aortic and mitral valve replacement surgery, c/o acute onset midline, lower chest pain at rest, that started a couple days ago. Constant today. Was dull. Radiated to back. No nv. No abd pain or distension. Denies heartburn. No associated sob, nv or diaphoresis. No pleuritic pain. No leg pain or swelling. No hx gallstones. No hx cad. Compliant w normal meds.  Patient saw pcp 2 days ago, w cough, congestion, and was dx w uri and copd exacerbation, was given rx abx, mdi and medrol. Pt states breathing improved.         Past Medical History  Diagnosis Date  . IBS (irritable bowel syndrome)   . COPD (chronic obstructive pulmonary disease)   . Asthma   . Lung nodule     RLL nodule-56mm stable 2006, April 2009, and June 2009  . Anemia   . Hyperlipemia   . Obesity   . Essential hypertension   . Sleep apnea     PT TOLD BORDERLINE-TRIED CPAP-DID NOT HELP-SHE DOES NOT USE CPAP  . Arthritis   . History of radiation therapy 05/30/11 to 07/07/11    rectum  . History of pneumonia     several times  . History of shingles   . Rectal cancer 04/2011    T3N0; s/p lap LAR, s/p ileostomy, s/p reversal, s/p chemo  . Severe aortic valve stenosis  AVR 3/15 10/01/2013    mild-mod by echo 02/2014  . Rheumatic heart disease mitral stenosis     mod MS by echo 02/2014  . Anxiety   . S/P AVR (aortic valve replacement) 2015     bioprosthetic (Bartle)  . S/P MVR (mitral valve replacement) 2015    bioprosthetic (Bartle)  . DDD (degenerative disc disease)     cervical - kyphosis with mod DD changes C5/6 and C6/7; lumbar - early DD at L2/3 (Elsner)  . Chronic diastolic heart failure 03/54/6568   Past Surgical History  Procedure Laterality Date  . Foot surgery  left foot    hammer toe  . Hand surgery  Bil    carpal tunnel  . Back surgery  2006, 2007    2006 SPACER, 2007 decompression and fusion L4/5  . Tonsillectomy    . Colon resection  08/25/2011    Procedure: COLON RESECTION LAPAROSCOPIC;  Surgeon: Stark Klein, MD;  Location: [facility];  Service: General;  Laterality: N/A;  Laparoscopic Assisted Low Anterior Resection Diverting Ostomy and onQ pain pump  . Ileostomy  08/25/2011  . Ileostomy closure  04/02/2012    Procedure: ILEOSTOMY TAKEDOWN;  Surgeon: Stark Klein, MD;  Location: [facility];  Service: General;  Laterality: N/A;  . Bowel resection  04/02/2012    Procedure: SMALL BOWEL RESECTION;  Surgeon: Stark Klein, MD;  Location: [facility];  Service: General;  Laterality: N/A;  . Carpal tunnel release    . Cardiac catheterization  11/27/2013  . Tee without cardioversion N/A 11/29/2013  Procedure: TRANSESOPHAGEAL ECHOCARDIOGRAM (TEE);  Surgeon: Sueanne Margarita, MD;  Location: [facility];  Service: Cardiovascular;  Laterality: N/A;  . Aortic valve replacement N/A 12/12/2013    Procedure: AORTIC VALVE REPLACEMENT (AVR);  Surgeon: Gaye Pollack, MD; Service: Open Heart Surgery  . Mitral valve replacement N/A 12/12/2013    Procedure: MITRAL VALVE (MV) REPLACEMENT OR REPAIR;  Surgeon: Gaye Pollack, MD; Service: Open Heart Surgery  . Intraoperative transesophageal echocardiogram N/A 12/12/2013    Procedure: INTRAOPERATIVE TRANSESOPHAGEAL ECHOCARDIOGRAM;  Surgeon: Gaye Pollack, MD;  Location: [facility];  Service: Open Heart Surgery;  Laterality: N/A;  . Colonoscopy  03/2013    1 polyp, rpt 3 yrs Ardis Hughs)  . Left and right  heart catheterization with coronary angiogram N/A 11/27/2013    Procedure: LEFT AND RIGHT HEART CATHETERIZATION WITH CORONARY ANGIOGRAM;  Surgeon: Sanda Klein, MD;  Location: [facility] CATH LAB;  Service: Cardiovascular;  Laterality: N/A;   Family History  Problem Relation Age of Onset  . Cancer Father     lung  . Heart disease Mother   . Diabetes Mother   . Hypertension Mother   . Colon cancer Neg Hx   . Esophageal cancer Neg Hx   . Stomach cancer Neg Hx   . Rectal cancer Neg Hx   . CAD Son 31    MI - deceased   History  Substance Use Topics  . Smoking status: Former Smoker -- 1.50 packs/day for 10 years    Types: Cigarettes    Quit date: 12/27/1974  . Smokeless tobacco: Never Used  . Alcohol Use: No     Comment: rare   OB History    No data available     Review of Systems  Constitutional: Negative for fever and chills.  HENT: Positive for congestion. Negative for sore throat.   Eyes: Negative for redness.  Respiratory: Positive for cough. Negative for shortness of breath.   Cardiovascular: Positive for chest pain. Negative for leg swelling.  Gastrointestinal: Negative for nausea, vomiting and abdominal pain.  Genitourinary: Negative for flank pain.  Musculoskeletal: Negative for back pain and neck pain.  Skin: Negative for rash.  Neurological: Negative for headaches.  Hematological: Does not bruise/bleed easily.  Psychiatric/Behavioral: Negative for confusion.      Allergies  Codeine; Prednisone; and Sulfonamide derivatives  Home Medications   Prior to Admission medications   Medication Sig Start Date End Date Taking? Authorizing Provider  aspirin 81 MG tablet Take 81 mg by mouth at bedtime.    Yes Historical Provider, MD  azithromycin (ZITHROMAX Z-PAK) 250 MG tablet 2 day 1, then 1 qd Patient taking differently: Take 250 mg by mouth See admin instructions. 2 day 1, then 1 qd 11/14/14  Yes Hendricks Limes, MD  BIOTIN PO Take 1 tablet by mouth at bedtime.    Yes  Historical Provider, MD  BREO ELLIPTA 100-25 MCG/INH AEPB inhale 1 puff by mouth once daily 04/30/14  Yes Brand Males, MD  Calcium Carbonate-Vitamin D (CALCIUM 600 + D PO) Take 1,200 mg by mouth at bedtime.    Yes Historical Provider, MD  Cholecalciferol (VITAMIN D) 2000 UNITS CAPS Take 1 capsule by mouth at bedtime.    Yes Historical Provider, MD  furosemide (LASIX) 40 MG tablet Take 1 tablet (40 mg total) by mouth daily. 05/12/14  Yes Mihai Croitoru, MD  ipratropium-albuterol (DUONEB) 0.5-2.5 (3) MG/3ML SOLN Take 3 mLs by nebulization every 6 (six) hours as needed. 11/14/14  Yes Hendricks Limes, MD  lovastatin (MEVACOR) 40 MG tablet take 1 tablet by mouth at bedtime   Yes Ria Bush, MD  methylPREDNISolone (MEDROL) 8 MG tablet Take 2 tablets (16 mg total) by mouth 2 (two) times daily. 11/14/14  Yes Hendricks Limes, MD  Miconazole Nitrate 2 % POWD 1 application by Does not apply route daily as needed (for skin). Apply to stomach and under breasts   Yes Historical Provider, MD  montelukast (SINGULAIR) 10 MG tablet Take 1 tablet (10 mg total) by mouth at bedtime. 01/31/14  Yes Brand Males, MD  pantoprazole (PROTONIX) 40 MG tablet Take 1 tablet (40 mg total) by mouth at bedtime. 04/08/14  Yes Mihai Croitoru, MD  polyethylene glycol (MIRALAX / GLYCOLAX) packet Take 17 g by mouth daily. 12/02/13  Yes Brett Canales, PA-C  potassium chloride (K-DUR,KLOR-CON) 10 MEQ tablet Take 2 tablets (20 mEq total) by mouth daily. 05/12/14  Yes Mihai Croitoru, MD  SPIRIVA HANDIHALER 18 MCG inhalation capsule inhale the contents of one capsule in the handihaler once daily   Yes Brand Males, MD  traMADol (ULTRAM) 50 MG tablet Take 50 mg by mouth every 6 (six) hours as needed for moderate pain.   Yes Historical Provider, MD  albuterol (PROVENTIL HFA;VENTOLIN HFA) 108 (90 BASE) MCG/ACT inhaler Inhale 2 puffs into the lungs every 6 (six) hours as needed for wheezing.    Historical Provider, MD   BP 146/66 mmHg   Pulse 88  Temp(Src) 97.8 F (36.6 C) (Oral)  Resp 18  Wt 216 lb (97.977 kg)  SpO2 96% Physical Exam  Constitutional: She appears well-developed and well-nourished. No distress.  HENT:  Mouth/Throat: Oropharynx is clear and moist.  Eyes: Conjunctivae are normal. No scleral icterus.  Neck: Neck supple. No tracheal deviation present.  Cardiovascular: Normal rate, regular rhythm and intact distal pulses.  Exam reveals no gallop and no friction rub.   Murmur heard. Pulmonary/Chest: Effort normal and breath sounds normal. No respiratory distress. She exhibits no tenderness.  Abdominal: Soft. Normal appearance and bowel sounds are normal. She exhibits no distension and no mass. There is no tenderness. There is no rebound and no guarding.  Genitourinary:  No cva tenderness  Musculoskeletal: She exhibits no edema or tenderness.  Neurological: She is alert.  Skin: Skin is warm and dry. No rash noted. She is not diaphoretic.  Psychiatric: She has a normal mood and affect.  Nursing note and vitals reviewed.   ED Course  Procedures (including critical care time) Labs Review  Results for orders placed or performed during the hospital encounter of 11/16/14  CBC  Result Value Ref Range   WBC 15.4 (H) 4.0 - 10.5 K/uL   RBC 4.20 3.87 - 5.11 MIL/uL   Hemoglobin 12.1 12.0 - 15.0 g/dL   HCT 37.4 36.0 - 46.0 %   MCV 89.0 78.0 - 100.0 fL   MCH 28.8 26.0 - 34.0 pg   MCHC 32.4 30.0 - 36.0 g/dL   RDW 14.4 11.5 - 15.5 %   Platelets 173 150 - 400 K/uL  Basic metabolic panel  Result Value Ref Range   Sodium 141 135 - 145 mmol/L   Potassium 4.2 3.5 - 5.1 mmol/L   Chloride 103 96 - 112 mmol/L   CO2 30 19 - 32 mmol/L   Glucose, Bld 142 (H) 70 - 99 mg/dL   BUN 25 (H) 6 - 23 mg/dL   Creatinine, Ser 1.29 (H) 0.50 - 1.10 mg/dL   Calcium 9.7 8.4 - 10.5 mg/dL  GFR calc non Af Amer 41 (L) >90 mL/min   GFR calc Af Amer 48 (L) >90 mL/min   Anion gap 8 5 - 15  I-stat troponin, ED (not at [facility])  Result  Value Ref Range   Troponin i, poc 0.04 0.00 - 0.08 ng/mL   Comment 3           *Note: Due to a large number of results and/or encounters for the requested time period, some results have not been displayed. A complete set of results can be found in Results Review.   Dg Chest 2 View  11/16/2014   CLINICAL DATA:  Chest pain for 1 day  EXAM: CHEST  2 VIEW  COMPARISON:  CT 08/25/2014, chest radiograph 08/25/2014, chest CT dating back to 03/15/2012  FINDINGS: The heart size and mediastinal contours are within normal limits. Both lungs are clear with the exception of stable right upper lobe nodularity suggesting a benign process such as scarring given stability since prior exam dating back to at least 03/15/2012. Right pleural thickening is stable. The visualized skeletal structures are unremarkable.  IMPRESSION: No active cardiopulmonary disease.   Electronically Signed   By: Conchita Paris [name]   On: 11/16/2014 15:48       EKG Interpretation   Date/Time:  Sunday November 16 2014 14:26:25 EST Ventricular Rate:  85 PR Interval:  218 QRS Duration: 166 QT Interval:  438 QTC Calculation: 521 R Axis:   114 Text Interpretation:  Sinus rhythm with 1st degree A-V block Right bundle  branch block Left posterior fascicular blockBifascicular block Confirmed by Ashok Cordia  MD, Lennette Bihari ([id number]) on 11/16/2014 2:38:45 PM      MDM   Iv ns. Labs.  Reviewed nursing notes and prior charts for additional history.   Recheck pt comfortable. No sob or cp.   Pt expresses desire to be able to go home post labs/workup.  Cmet, lipase and delta troponin pending.  Signed out pt to Dr Mingo Amber at [phone] that labs pending.  Check labs, recheck pt, and dispo appropriately.      Mirna Mires, MD 11/16/14 [phone]

## 2014-11-16 NOTE — H&P (Signed)
[facility] History and Physical  [name] GMW:[id number] DOB: Mar 02, 1946 DOA: 11/16/2014  PCP: Ria Bush, MD   Chief Complaint: Epigastric pain  HPI: [name] is a 69 y.o. female with a history of rectal cancer, aortic and mitral valve replacements, and COPD who presented to the [facility] ED on  11/16/2014 for evaluation of the acute onset of epigastric pain.  The patient was in her baseline state of health until Friday.  At that time, she had unexplained chills and sweats, but no significant pain.  No documented fever.  She was seen in her PCP's office and diagnosed with acute bronchitis/acute exacerbation of COPD.  She has been taking azithromycin and a Medrol dose pack as prescribed.  She stayed home from church today because of her bronchitis, and after visiting with a friend this afternoon, she experienced the sudden onset of 8 out of 10 pressure-like pain in her epigastric area that radiated to the middle of her back.  Today, it has not been associated with nausea or vomiting (though she had nausea on Friday).  She tolerated breakfast today without difficulty, but has not really tried to eat since.  She took two tramadol at home, which provided some relief.  However, her husband urged her to present for further evaluation.  She had similar symptoms in the past and was diagnosed with pericarditis at that time.  Evaluation in the ED shows elevated LFTs (non-obstructive pattern) and a lipase of 1377.  She is being admitted for management of acute pancreatitis.   CT showing mild inflammatory changes around the gallbladder.  Further imaging recommended.  Review of Systems: 12 systems reviewed and negative except as stated in HPI.  Past Medical History  Diagnosis Date  . IBS (irritable bowel syndrome)   . COPD (chronic obstructive pulmonary disease)   . Asthma   . Lung nodule     RLL nodule-74mm stable 2006, April 2009, and June 2009  . Anemia   . Hyperlipemia   .  Obesity   . Essential hypertension   . Sleep apnea     PT TOLD BORDERLINE-TRIED CPAP-DID NOT HELP-SHE DOES NOT USE CPAP  . Arthritis   . History of radiation therapy 05/30/11 to 07/07/11    rectum  . History of pneumonia     several times  . History of shingles   . Rectal cancer 04/2011    T3N0; s/p lap LAR, s/p ileostomy, s/p reversal, s/p chemo  . Severe aortic valve stenosis  AVR 3/15 10/01/2013    mild-mod by echo 02/2014  . Rheumatic heart disease mitral stenosis     mod MS by echo 02/2014  . Anxiety   . S/P AVR (aortic valve replacement) 2015    bioprosthetic (Bartle)  . S/P MVR (mitral valve replacement) 2015    bioprosthetic (Bartle)  . DDD (degenerative disc disease)     cervical - kyphosis with mod DD changes C5/6 and C6/7; lumbar - early DD at L2/3 (Elsner)  . Chronic diastolic heart failure 44/12/4740   Past Surgical History  Procedure Laterality Date  . Foot surgery  left foot    hammer toe  . Hand surgery  Bil    carpal tunnel  . Back surgery  2006, 2007    2006 SPACER, 2007 decompression and fusion L4/5  . Tonsillectomy    . Colon resection  08/25/2011    Procedure: COLON RESECTION LAPAROSCOPIC;  Surgeon: Stark Klein, MD;  Location: [facility];  Service: General;  Laterality: N/A;  Laparoscopic Assisted Low Anterior Resection Diverting Ostomy and onQ pain pump  . Ileostomy  08/25/2011  . Ileostomy closure  04/02/2012    Procedure: ILEOSTOMY TAKEDOWN;  Surgeon: Stark Klein, MD;  Location: [facility];  Service: General;  Laterality: N/A;  . Bowel resection  04/02/2012    Procedure: SMALL BOWEL RESECTION;  Surgeon: Stark Klein, MD;  Location: [facility];  Service: General;  Laterality: N/A;  . Carpal tunnel release    . Cardiac catheterization  11/27/2013  . Tee without cardioversion N/A 11/29/2013    Procedure: TRANSESOPHAGEAL ECHOCARDIOGRAM (TEE);  Surgeon: Sueanne Margarita, MD;  Location: [facility];  Service: Cardiovascular;  Laterality: N/A;  . Aortic valve replacement  N/A 12/12/2013    Procedure: AORTIC VALVE REPLACEMENT (AVR);  Surgeon: Gaye Pollack, MD; Service: Open Heart Surgery  . Mitral valve replacement N/A 12/12/2013    Procedure: MITRAL VALVE (MV) REPLACEMENT OR REPAIR;  Surgeon: Gaye Pollack, MD; Service: Open Heart Surgery  . Intraoperative transesophageal echocardiogram N/A 12/12/2013    Procedure: INTRAOPERATIVE TRANSESOPHAGEAL ECHOCARDIOGRAM;  Surgeon: Gaye Pollack, MD;  Location: [facility] OR;  Service: Open Heart Surgery;  Laterality: N/A;  . Colonoscopy  03/2013    1 polyp, rpt 3 yrs Ardis Hughs)  . Left and right heart catheterization with coronary angiogram N/A 11/27/2013    Procedure: LEFT AND RIGHT HEART CATHETERIZATION WITH CORONARY ANGIOGRAM;  Surgeon: Sanda Klein, MD;  Location: [facility];  Service: Cardiovascular;  Laterality: N/A;   Social History:  History   Social History Narrative   Lives with husband and 1 cat.    2 sons; 1 grandchild   Married 1966   Retired-[employer], volunteers at [employer]   Activity: pulm rehab   Diet: some water, fruits/vegetables daily  One of her sons died unexpectedly on New Year's Eve 2015.  Acute MI suspected.  She becomes emotional when discussing this.  Allergies  Allergen Reactions  . Codeine Nausea Only  . Prednisone Hives    In different places all over the body.  . Sulfonamide Derivatives Nausea Only    Family History  Problem Relation Age of Onset  . Cancer Father     lung  . Heart disease Mother   . Diabetes Mother   . Hypertension Mother   . Colon cancer Neg Hx   . Esophageal cancer Neg Hx   . Stomach cancer Neg Hx   . Rectal cancer Neg Hx   . CAD Son 65    MI - deceased    Prior to Admission medications   Medication Sig Start Date End Date Taking? Authorizing Provider  aspirin 81 MG tablet Take 81 mg by mouth at bedtime.    Yes Historical Provider, MD  azithromycin (ZITHROMAX Z-PAK) 250 MG tablet 2 day 1, then 1 qd Patient taking differently: Take 250 mg by mouth See admin  instructions. 2 day 1, then 1 qd 11/14/14  Yes Hendricks Limes, MD  BIOTIN PO Take 1 tablet by mouth at bedtime.    Yes Historical Provider, MD  BREO ELLIPTA 100-25 MCG/INH AEPB inhale 1 puff by mouth once daily 04/30/14  Yes Brand Males, MD  Calcium Carbonate-Vitamin D (CALCIUM 600 + D PO) Take 1,200 mg by mouth at bedtime.    Yes Historical Provider, MD  Cholecalciferol (VITAMIN D) 2000 UNITS CAPS Take 1 capsule by mouth at bedtime.    Yes Historical Provider, MD  furosemide (LASIX) 40 MG tablet Take 1 tablet (40 mg total) by  mouth daily. 05/12/14  Yes Mihai Croitoru, MD  ipratropium-albuterol (DUONEB) 0.5-2.5 (3) MG/3ML SOLN Take 3 mLs by nebulization every 6 (six) hours as needed. 11/14/14  Yes Hendricks Limes, MD  lovastatin (MEVACOR) 40 MG tablet take 1 tablet by mouth at bedtime   Yes Ria Bush, MD  methylPREDNISolone (MEDROL) 8 MG tablet Take 2 tablets (16 mg total) by mouth 2 (two) times daily. 11/14/14  Yes Hendricks Limes, MD  Miconazole Nitrate 2 % POWD 1 application by Does not apply route daily as needed (for skin). Apply to stomach and under breasts   Yes Historical Provider, MD  montelukast (SINGULAIR) 10 MG tablet Take 1 tablet (10 mg total) by mouth at bedtime. 01/31/14  Yes Brand Males, MD  pantoprazole (PROTONIX) 40 MG tablet Take 1 tablet (40 mg total) by mouth at bedtime. 04/08/14  Yes Mihai Croitoru, MD  polyethylene glycol (MIRALAX / GLYCOLAX) packet Take 17 g by mouth daily. 12/02/13  Yes Brett Canales, PA-C  potassium chloride (K-DUR,KLOR-CON) 10 MEQ tablet Take 2 tablets (20 mEq total) by mouth daily. 05/12/14  Yes Mihai Croitoru, MD  SPIRIVA HANDIHALER 18 MCG inhalation capsule inhale the contents of one capsule in the handihaler once daily   Yes Brand Males, MD  traMADol (ULTRAM) 50 MG tablet Take 50 mg by mouth every 6 (six) hours as needed for moderate pain.   Yes Historical Provider, MD  albuterol (PROVENTIL HFA;VENTOLIN HFA) 108 (90 BASE) MCG/ACT inhaler  Inhale 2 puffs into the lungs every 6 (six) hours as needed for wheezing.    Historical Provider, MD   Physical Exam: Filed Vitals:   11/16/14 1747 11/16/14 1902 11/16/14 1930 11/16/14 2100  BP: 143/61 146/63 152/66 173/80  Pulse: 66 71 75 69  Temp: 98.1 F (36.7 C)   97.1 F (36.2 C)  TempSrc: Oral   Oral  Resp: 16 14 16 16   Height:    5\' 6"  (1.676 m)  Weight:    98.385 kg (216 lb 14.4 oz)  SpO2: 100% 98% 100% 97%     General:  Awake and alert x 4, very pleasant, nontoxic appearing  Eyes: PERRL bilaterally  ENT: moist mucous membranes, no oral lesions, posterior oropharynx visualized  Neck: supple  Cardiovascular: NR/RR  Respiratory: bilateral intermittent end expiratory wheezes  Abdomen: soft/NT/ND.  No guarding or rebound tenderness.  Bowel sounds are present.  Skin: Warm and dry.  Musculoskeletal: Moves all four extremities spontaneously.  Moves from recliner to bed without assistance and without difficulty.  Psychiatric: Normal affect  Neurologic: No focal deficits  Labs on Admission:  Basic Metabolic Panel:  Recent Labs Lab 11/16/14 1447  NA 141  K 4.2  CL 103  CO2 30  GLUCOSE 142*  BUN 25*  CREATININE 1.29*  CALCIUM 9.7   Liver Function Tests:  Recent Labs Lab 11/16/14 1607  AST 465*  ALT 208*  ALKPHOS 133*  BILITOT 1.0  PROT 6.3  ALBUMIN 3.3*    Recent Labs Lab 11/16/14 1607  LIPASE 1377*   CBC:  Recent Labs Lab 11/16/14 1447  WBC 15.4*  HGB 12.1  HCT 37.4  MCV 89.0  PLT 173   Cardiac Enzymes:  Recent Labs Lab 11/16/14 1651  TROPONINI 0.09*    BNP (last 3 results)  Recent Labs  11/16/14 1447  BNP 211.0*    Radiological Exams on Admission: Dg Chest 2 View  11/16/2014   CLINICAL DATA:  Chest pain for 1 day  EXAM: CHEST  2  VIEW  COMPARISON:  CT 08/25/2014, chest radiograph 08/25/2014, chest CT dating back to 03/15/2012  FINDINGS: The heart size and mediastinal contours are within normal limits. Both lungs are  clear with the exception of stable right upper lobe nodularity suggesting a benign process such as scarring given stability since prior exam dating back to at least 03/15/2012. Right pleural thickening is stable. The visualized skeletal structures are unremarkable.  IMPRESSION: No active cardiopulmonary disease.   Electronically Signed   By: Conchita Paris [name]   On: 11/16/2014 15:48   Ct Abdomen Pelvis W Contrast  11/16/2014   CLINICAL DATA:  Initial evaluation for chest pressure, abnormal labs, personal history of rectal carcinoma  EXAM: CT ABDOMEN AND PELVIS WITH CONTRAST  TECHNIQUE: Multidetector CT imaging of the abdomen and pelvis was performed using the standard protocol following bolus administration of intravenous contrast.  CONTRAST:  65mL OMNIPAQUE IOHEXOL 300 MG/ML SOLN, 191mL OMNIPAQUE IOHEXOL 300 MG/ML SOLN  COMPARISON:  07/23/2014  FINDINGS: Small right pleural effusion with irregular pleural thickening at the lateral and posterior right lung base, stable from prior study. Mild bibasilar atelectasis. Heavy mitral annulus calcification stable.  Liver is normal. Questionable minimal gallbladder wall thickening and pericholecystic stranding. No biliary dilatation. Spleen and pancreas normal.  Adrenal glands normal.  Small renal cysts stable.  Calcification of the abdominal aorta. No significant mesenteric or retroperitoneal adenopathy.  Stomach and small bowel are normal. Appendix is normal. Postsurgical change at the rectosigmoid junction associated with presacral soft tissue thickening, stable.  Bladder is normal. Reproductive organs show no acute abnormalities. No acute musculoskeletal findings.  IMPRESSION: 1. Questionable minimal gallbladder wall thickening and minimal pericholecystic inflammation. Correlate clinically and consider right upper quadrant ultrasound if indicated. 2. Stable irregular right pleural thickening, bilateral renal cysts, and postsurgical change at the rectosigmoid junction  with stable presacral soft tissue thickening.   Electronically Signed   By: Skipper Cliche [name]   On: 11/16/2014 20:46    EKG: Independently reviewed.  RBBB/bifascicular block pattern has been documented in the past.  Assessment/Plan 1. Acute pancreatitis with abnormal gallbladder on CT.   --Admit to telemetry --Bowel rest, IV fluids, anti-emetics and analgesics as needed --RUQ U/S in the AM --Consider GI/surgery consult based on clinical response and U/S findings  2. Abnormal troponin of unclear significance; follow trend  3. Mild AKI; expect improvement with hydration  4. Mild leukocytosis; likely multifactorial (recent steroids; acute phase reactant in the setting of acute pancreatitis).  Follow trend.  5. Hx of COPD with recent diagnosis of exacerbation.  Azithromycin x 2 more days.  Expedite methylprednisolone taper.  6. Rectal cancer survivor.  CT changes stable.  7. Hx of Aortic and mitral valve replacements, bioprosthetic.  Baby aspirin only.   Code Status: FULL  Time spent: 55 minutes  Eber Jones [facility] Pager [phone]  If 7PM-7AM, please contact night-coverage [email or web address] Password [email or web address] 11/16/2014, 11:05 PM

## 2014-11-16 NOTE — ED Notes (Signed)
I gave the patient a pair of large dark blue socks.

## 2014-11-16 NOTE — ED Notes (Signed)
Report attempted, RN to call back for report. 

## 2014-11-16 NOTE — ED Notes (Signed)
Pt reports chest pain that started a couple of days ago. States she had the same happen and had a full cardiac work up. States she was seen at PCP and had a COPD flare. On prednisone.

## 2014-11-16 NOTE — ED Notes (Signed)
Patient transported to CT 

## 2014-11-17 ENCOUNTER — Observation Stay (HOSPITAL_COMMUNITY): Payer: Medicare Other

## 2014-11-17 ENCOUNTER — Encounter (HOSPITAL_COMMUNITY): Payer: Self-pay | Admitting: General Practice

## 2014-11-17 DIAGNOSIS — K859 Acute pancreatitis without necrosis or infection, unspecified: Secondary | ICD-10-CM | POA: Diagnosis present

## 2014-11-17 DIAGNOSIS — F419 Anxiety disorder, unspecified: Secondary | ICD-10-CM | POA: Diagnosis present

## 2014-11-17 DIAGNOSIS — Z6834 Body mass index (BMI) 34.0-34.9, adult: Secondary | ICD-10-CM | POA: Diagnosis not present

## 2014-11-17 DIAGNOSIS — J440 Chronic obstructive pulmonary disease with acute lower respiratory infection: Secondary | ICD-10-CM | POA: Diagnosis present

## 2014-11-17 DIAGNOSIS — J209 Acute bronchitis, unspecified: Secondary | ICD-10-CM | POA: Diagnosis present

## 2014-11-17 DIAGNOSIS — Z85048 Personal history of other malignant neoplasm of rectum, rectosigmoid junction, and anus: Secondary | ICD-10-CM | POA: Diagnosis not present

## 2014-11-17 DIAGNOSIS — M1990 Unspecified osteoarthritis, unspecified site: Secondary | ICD-10-CM | POA: Diagnosis present

## 2014-11-17 DIAGNOSIS — E860 Dehydration: Secondary | ICD-10-CM | POA: Diagnosis present

## 2014-11-17 DIAGNOSIS — Z952 Presence of prosthetic heart valve: Secondary | ICD-10-CM | POA: Diagnosis not present

## 2014-11-17 DIAGNOSIS — Z923 Personal history of irradiation: Secondary | ICD-10-CM | POA: Diagnosis not present

## 2014-11-17 DIAGNOSIS — J45909 Unspecified asthma, uncomplicated: Secondary | ICD-10-CM | POA: Diagnosis present

## 2014-11-17 DIAGNOSIS — Z87891 Personal history of nicotine dependence: Secondary | ICD-10-CM | POA: Diagnosis not present

## 2014-11-17 DIAGNOSIS — E785 Hyperlipidemia, unspecified: Secondary | ICD-10-CM | POA: Diagnosis present

## 2014-11-17 DIAGNOSIS — G4733 Obstructive sleep apnea (adult) (pediatric): Secondary | ICD-10-CM | POA: Diagnosis present

## 2014-11-17 DIAGNOSIS — Z7982 Long term (current) use of aspirin: Secondary | ICD-10-CM | POA: Diagnosis not present

## 2014-11-17 DIAGNOSIS — E669 Obesity, unspecified: Secondary | ICD-10-CM | POA: Diagnosis present

## 2014-11-17 DIAGNOSIS — K589 Irritable bowel syndrome without diarrhea: Secondary | ICD-10-CM | POA: Diagnosis present

## 2014-11-17 DIAGNOSIS — N179 Acute kidney failure, unspecified: Secondary | ICD-10-CM | POA: Diagnosis present

## 2014-11-17 DIAGNOSIS — K828 Other specified diseases of gallbladder: Secondary | ICD-10-CM | POA: Diagnosis present

## 2014-11-17 DIAGNOSIS — I5032 Chronic diastolic (congestive) heart failure: Secondary | ICD-10-CM | POA: Diagnosis present

## 2014-11-17 DIAGNOSIS — I10 Essential (primary) hypertension: Secondary | ICD-10-CM | POA: Diagnosis present

## 2014-11-17 DIAGNOSIS — R1013 Epigastric pain: Secondary | ICD-10-CM | POA: Diagnosis present

## 2014-11-17 DIAGNOSIS — D696 Thrombocytopenia, unspecified: Secondary | ICD-10-CM | POA: Diagnosis present

## 2014-11-17 DIAGNOSIS — Z9049 Acquired absence of other specified parts of digestive tract: Secondary | ICD-10-CM | POA: Diagnosis present

## 2014-11-17 LAB — CBC
HCT: 42.7 % (ref 36.0–46.0)
Hemoglobin: 14.2 g/dL (ref 12.0–15.0)
MCH: 29.6 pg (ref 26.0–34.0)
MCHC: 33.3 g/dL (ref 30.0–36.0)
MCV: 89 fL (ref 78.0–100.0)
Platelets: 153 10*3/uL (ref 150–400)
RBC: 4.8 MIL/uL (ref 3.87–5.11)
RDW: 14.7 % (ref 11.5–15.5)
WBC: 11.3 10*3/uL — ABNORMAL HIGH (ref 4.0–10.5)

## 2014-11-17 LAB — LIPASE, BLOOD: Lipase: 264 U/L — ABNORMAL HIGH (ref 11–59)

## 2014-11-17 LAB — COMPREHENSIVE METABOLIC PANEL
ALT: 447 U/L — ABNORMAL HIGH (ref 0–35)
AST: 455 U/L — ABNORMAL HIGH (ref 0–37)
Albumin: 3.7 g/dL (ref 3.5–5.2)
Alkaline Phosphatase: 161 U/L — ABNORMAL HIGH (ref 39–117)
Anion gap: 7 (ref 5–15)
BUN: 23 mg/dL (ref 6–23)
CO2: 29 mmol/L (ref 19–32)
Calcium: 9.4 mg/dL (ref 8.4–10.5)
Chloride: 102 mmol/L (ref 96–112)
Creatinine, Ser: 1.25 mg/dL — ABNORMAL HIGH (ref 0.50–1.10)
GFR calc Af Amer: 50 mL/min — ABNORMAL LOW (ref >90)
GFR calc non Af Amer: 43 mL/min — ABNORMAL LOW (ref >90)
Glucose, Bld: 78 mg/dL (ref 70–99)
Potassium: 3.7 mmol/L (ref 3.5–5.1)
Sodium: 138 mmol/L (ref 135–145)
Total Bilirubin: 0.9 mg/dL (ref 0.3–1.2)
Total Protein: 7.4 g/dL (ref 6.0–8.3)

## 2014-11-17 LAB — TROPONIN I
Troponin I: 0.09 ng/mL — ABNORMAL HIGH (ref <0.031)
Troponin I: 0.08 ng/mL — ABNORMAL HIGH (ref <0.031)

## 2014-11-17 MED ORDER — LEVOFLOXACIN IN D5W 750 MG/150ML IV SOLN
750.0000 mg | INTRAVENOUS | Status: DC
  Administered 2014-11-17: 750 mg via INTRAVENOUS
  Filled 2014-11-17 (×2): qty 150

## 2014-11-17 MED ORDER — POLYETHYLENE GLYCOL 3350 17 G PO PACK
17.0000 g | PACK | Freq: Once | ORAL | Status: AC
  Administered 2014-11-17: 17 g via ORAL
  Filled 2014-11-17: qty 1

## 2014-11-17 MED ORDER — ENOXAPARIN SODIUM 60 MG/0.6ML ~~LOC~~ SOLN
50.0000 mg | SUBCUTANEOUS | Status: DC
  Administered 2014-11-17 – 2014-11-18 (×2): 50 mg via SUBCUTANEOUS
  Filled 2014-11-17 (×3): qty 0.6

## 2014-11-17 NOTE — Progress Notes (Signed)
Patient Demographics  [name], is a 69 y.o. female, DOB - 07-14-1946, YKD:[id number]  Admit date - 11/16/2014   Admitting Physician Eber Jones, MD  Outpatient Primary MD for the patient is Ria Bush, MD  LOS - 1   Chief Complaint  Patient presents with  . Chest Pain      Admission history of present illness/brief narrative: [name] is a 69 y.o. female with a history of rectal cancer, aortic and mitral valve replacements, and COPD who presented to ED with acute onset of epigastric pain. T she had unexplained chills and sweats, No documented fever. She was seen in her PCP's office and diagnosed with acute bronchitis/acute exacerbation of COPD. She has been taking azithromycin and a Medrol dose pack as prescribed.  she experienced the sudden onset of 8 out of 10 pressure-like pain in her epigastric area that radiated to the middle of her back. associated with nausea or vomiting She took two tramadol at home, which provided some relief. However, her husband urged her to present for further evaluation. She had similar symptoms in the past and was diagnosed with pericarditis at that time. Evaluation in the ED shows elevated LFTs (non-obstructive pattern) and a lipase of 1377. She is being admitted for management of acute pancreatitis. CT showing mild inflammatory changes around the gallbladder. Further imaging recommended. Patient is kept nothing by mouth, right upper quadrant ultrasound is pending for further evaluation,  Subjective:   [name] today has, No headache, No chest pain, No further abdominal pain - no nausea or vomiting since in addition, No new weakness tingling or numbness, still reports some cough with productive sputum.  Assessment & Plan    Active Problems:   Pancreatitis, acute  Acute pancreatitis - With  abnormal gallbladder on CT abdomen and pelvis, this is most likely related to gallstone pancreatitis, awaiting right upper quadrant ultrasound. - LFTs are elevated, but nonobstructive pattern. - Keep patient nothing by mouth, continue with IV fluids, when necessary nausea and pain medications. - Possible need for surgery consult depends on the ultrasound finding - We'll start IV levofloxacin empirically given abnormal gallbladder on CT abdomen and pelvis.  Elevated troponins - Most likely related to pancreatitis, dehydration, mild renal failure. -Continue to follow the trend - Denies any chest pain or shortness of breath  Mild acute kidney injury - Continue with IV fluids, improving  Acute bronchitis/history of COPD - On levofloxacin - Has decreased air entry, minimal wheezing, continue with methylprednisolone taper   Rectal cancer survivor.  -CT changes stable.   Hx of Aortic and mitral valve replacements, bioprosthetic. - Baby aspirin only.     Code Status: Full  Family Communication:  None at bedside  Disposition Plan: Home when stable   Procedures  None   Consults  None   Medications  Scheduled Meds: . aspirin EC  81 mg Oral QHS  . enoxaparin (LOVENOX) injection  50 mg Subcutaneous Q24H  . Fluticasone Furoate-Vilanterol  1 puff Inhalation Q1200  . levofloxacin (LEVAQUIN) IV  750 mg Intravenous Q24H  . methylPREDNISolone  4 mg Oral BID  . montelukast  10 mg Oral QHS  . pantoprazole  40 mg Oral QHS  . sodium chloride  3 mL  Intravenous Q12H  . tiotropium  18 mcg Inhalation Daily   Continuous Infusions: . sodium chloride 100 mL/hr at 11/17/14 0204   PRN Meds:.HYDROmorphone (DILAUDID) injection, ipratropium-albuterol, ondansetron **OR** ondansetron (ZOFRAN) IV, traMADol  DVT Prophylaxis  Lovenox  Lab Results  Component Value Date   PLT 153 11/17/2014    Antibiotics    Anti-infectives    Start     Dose/Rate Route Frequency Ordered Stop    11/17/14 1000  azithromycin (ZITHROMAX) tablet 250 mg  Status:  Discontinued     250 mg Oral Daily 11/16/14 2321 11/17/14 0726   11/17/14 0745  levofloxacin (LEVAQUIN) IVPB 750 mg     750 mg100 mL/hr over 90 Minutes Intravenous Every 24 hours 11/17/14 0730            Objective:   Filed Vitals:   11/16/14 2100 11/17/14 0000 11/17/14 0414 11/17/14 0749  BP: 173/80 146/93 154/82   Pulse: 69 63 72   Temp: 97.1 F (36.2 C) 97.4 F (36.3 C) 97.3 F (36.3 C)   TempSrc: Oral Oral Oral   Resp: 16 18 18    Height: 5\' 6"  (1.676 m)     Weight: 98.385 kg (216 lb 14.4 oz)  98.022 kg (216 lb 1.6 oz)   SpO2: 97% 97% 96% 98%    Wt Readings from Last 3 Encounters:  11/17/14 98.022 kg (216 lb 1.6 oz)  11/14/14 98.204 kg (216 lb 8 oz)  10/15/14 96.389 kg (212 lb 8 oz)     Intake/Output Summary (Last 24 hours) at 11/17/14 1111 Last data filed at 11/17/14 0905  Gross per 24 hour  Intake 393.33 ml  Output    700 ml  Net -306.67 ml     Physical Exam  Awake Alert, Oriented X 3, No new F.N deficits, Normal affect [name].AT,PERRAL Supple Neck,No JVD, No cervical lymphadenopathy appriciated.  Symmetrical Chest wall movement, Good air movement bilaterally, CTAB RRR,No Gallops,Rubs or new Murmurs, No Parasternal Heave +ve B.Sounds, Abd Soft, No tenderness, No organomegaly appriciated, No rebound - guarding or rigidity. No Cyanosis, Clubbing or edema, No new Rash or bruise     Data Review   Micro Results No results found for this or any previous visit (from the past 240 hour(s)).  Radiology Reports Dg Chest 2 View  11/16/2014   CLINICAL DATA:  Chest pain for 1 day  EXAM: CHEST  2 VIEW  COMPARISON:  CT 08/25/2014, chest radiograph 08/25/2014, chest CT dating back to 03/15/2012  FINDINGS: The heart size and mediastinal contours are within normal limits. Both lungs are clear with the exception of stable right upper lobe nodularity suggesting a benign process such as scarring given stability  since prior exam dating back to at least 03/15/2012. Right pleural thickening is stable. The visualized skeletal structures are unremarkable.  IMPRESSION: No active cardiopulmonary disease.   Electronically Signed   By: Conchita Paris [name]   On: 11/16/2014 15:48   Ct Abdomen Pelvis W Contrast  11/16/2014   CLINICAL DATA:  Initial evaluation for chest pressure, abnormal labs, personal history of rectal carcinoma  EXAM: CT ABDOMEN AND PELVIS WITH CONTRAST  TECHNIQUE: Multidetector CT imaging of the abdomen and pelvis was performed using the standard protocol following bolus administration of intravenous contrast.  CONTRAST:  30mL OMNIPAQUE IOHEXOL 300 MG/ML SOLN, 157mL OMNIPAQUE IOHEXOL 300 MG/ML SOLN  COMPARISON:  07/23/2014  FINDINGS: Small right pleural effusion with irregular pleural thickening at the lateral and posterior right lung base, stable from prior study. Mild  bibasilar atelectasis. Heavy mitral annulus calcification stable.  Liver is normal. Questionable minimal gallbladder wall thickening and pericholecystic stranding. No biliary dilatation. Spleen and pancreas normal.  Adrenal glands normal.  Small renal cysts stable.  Calcification of the abdominal aorta. No significant mesenteric or retroperitoneal adenopathy.  Stomach and small bowel are normal. Appendix is normal. Postsurgical change at the rectosigmoid junction associated with presacral soft tissue thickening, stable.  Bladder is normal. Reproductive organs show no acute abnormalities. No acute musculoskeletal findings.  IMPRESSION: 1. Questionable minimal gallbladder wall thickening and minimal pericholecystic inflammation. Correlate clinically and consider right upper quadrant ultrasound if indicated. 2. Stable irregular right pleural thickening, bilateral renal cysts, and postsurgical change at the rectosigmoid junction with stable presacral soft tissue thickening.   Electronically Signed   By: Skipper Cliche [name]   On: 11/16/2014 20:46     CBC  Recent Labs Lab 11/16/14 1447 11/17/14 0625  WBC 15.4* 11.3*  HGB 12.1 14.2  HCT 37.4 42.7  PLT 173 153  MCV 89.0 89.0  MCH 28.8 29.6  MCHC 32.4 33.3  RDW 14.4 14.7    Chemistries   Recent Labs Lab 11/16/14 1447 11/16/14 1607 11/17/14 0625  NA 141  --  138  K 4.2  --  3.7  CL 103  --  102  CO2 30  --  29  GLUCOSE 142*  --  78  BUN 25*  --  23  CREATININE 1.29*  --  1.25*  CALCIUM 9.7  --  9.4  AST  --  465* 455*  ALT  --  208* 447*  ALKPHOS  --  133* 161*  BILITOT  --  1.0 0.9   ------------------------------------------------------------------------------------------------------------------ estimated creatinine clearance is 50.2 mL/min (by C-G formula based on Cr of 1.25). ------------------------------------------------------------------------------------------------------------------ No results for input(s): HGBA1C in the last 72 hours. ------------------------------------------------------------------------------------------------------------------ No results for input(s): CHOL, HDL, LDLCALC, TRIG, CHOLHDL, LDLDIRECT in the last 72 hours. ------------------------------------------------------------------------------------------------------------------ No results for input(s): TSH, T4TOTAL, T3FREE, THYROIDAB in the last 72 hours.  Invalid input(s): FREET3 ------------------------------------------------------------------------------------------------------------------ No results for input(s): VITAMINB12, FOLATE, FERRITIN, TIBC, IRON, RETICCTPCT in the last 72 hours.  Coagulation profile No results for input(s): INR, PROTIME in the last 168 hours.  No results for input(s): DDIMER in the last 72 hours.  Cardiac Enzymes  Recent Labs Lab 11/16/14 1651 11/17/14 0051 11/17/14 0625  TROPONINI 0.09* 0.09* 0.08*   ------------------------------------------------------------------------------------------------------------------ Invalid input(s):  POCBNP     Time Spent in minutes   30 minutes   Leatta Alewine M.D on 11/17/2014 at 11:11 AM  Between 7am to 7pm - Pager - [phone]  After 7pm go to [email or web address] - password [email or web address]  And look for the night coverage person covering for me after hours  [facility] Office  [phone]   **Disclaimer: This note may have been dictated with voice recognition software. Similar sounding words can inadvertently be transcribed and this note may contain transcription errors which may not have been corrected upon publication of note.**

## 2014-11-17 NOTE — Progress Notes (Signed)
ANTIBIOTIC CONSULT NOTE - INITIAL  Pharmacy Consult for Levaquin  Indication: Intra-abdominal infection  Allergies  Allergen Reactions  . Codeine Nausea Only  . Prednisone Hives    In different places all over the body.  . Sulfonamide Derivatives Nausea Only    Patient Measurements: Height: 5\' 6"  (167.6 cm) Weight: 216 lb 1.6 oz (98.022 kg) IBW/kg (Calculated) : 59.3  Vital Signs: Temp: 97.3 F (36.3 C) (02/08 0414) Temp Source: Oral (02/08 0414) BP: 154/82 mmHg (02/08 0414) Pulse Rate: 72 (02/08 0414)  Labs:  Recent Labs  11/16/14 1447 11/17/14 0625  WBC 15.4* 11.3*  HGB 12.1 14.2  PLT 173 153  CREATININE 1.29* 1.25*   Estimated Creatinine Clearance: 50.2 mL/min (by C-G formula based on Cr of 1.25).  Medical History: Past Medical History  Diagnosis Date  . IBS (irritable bowel syndrome)   . COPD (chronic obstructive pulmonary disease)   . Asthma   . Lung nodule     RLL nodule-14mm stable 2006, April 2009, and June 2009  . Anemia   . Hyperlipemia   . Obesity   . Essential hypertension   . Sleep apnea     PT TOLD BORDERLINE-TRIED CPAP-DID NOT HELP-SHE DOES NOT USE CPAP  . Arthritis   . History of radiation therapy 05/30/11 to 07/07/11    rectum  . History of pneumonia     several times  . History of shingles   . Rectal cancer 04/2011    T3N0; s/p lap LAR, s/p ileostomy, s/p reversal, s/p chemo  . Severe aortic valve stenosis  AVR 3/15 10/01/2013    mild-mod by echo 02/2014  . Rheumatic heart disease mitral stenosis     mod MS by echo 02/2014  . Anxiety   . S/P AVR (aortic valve replacement) 2015    bioprosthetic (Bartle)  . S/P MVR (mitral valve replacement) 2015    bioprosthetic (Bartle)  . DDD (degenerative disc disease)     cervical - kyphosis with mod DD changes C5/6 and C6/7; lumbar - early DD at L2/3 (Elsner)  . Chronic diastolic heart failure 03/83/3383    Assessment: Levaquin for intra-abdominal infection. WBC mildly elevated. Mild renal  dysfunction.   Plan:  -Levaquin 750 mg IV q24h -Trend WBC, temp, renal function  -PO as able  Narda Bonds 11/17/2014,7:30 AM

## 2014-11-17 NOTE — Consult Note (Signed)
[facility] Surgery Consult Note  RYENNE LYNAM Mar 21, 1946  [id number].    Requesting MD: Dr. Waldron Labs Chief Complaint/Reason for Consult: Pancreatitis  HPI:  69 y/o female presents to [facility] yesterday on 11/16/14 with severe persistent epigastric abdominal pain which radiated to her back.  She has experienced something similar in December 2015, heart workup was negative and they thought she may have pericarditis.  The pain this time was exactly the same.  She took some ultram which did not help the pain.  Upon arrival to the ED the pain meds kicked in and her pain resolved.  No relation to food, had breakfast at 1000 and pain started at 12:30pm.  Pain was sharp, unbearable and then wane.  She describes the pain as a spasm.  Notes nausea, chills, sore throat, coughing.  No SOB, fever/chills, N/V.  No trouble urinating or BM's, no blood in stools, no weight loss.  Tolerating clears.  Saw Dr. Linna Darner in his office and was treated for COPD exacerbation on azithromycin and steroids.  H/o rectal cancer surgery in 2012 (LAR with ileostomy), ileostomy takedown in 2013 by Dr. Lilyan Punt.  She has most recently seen Dr. Barry Dienes in 07/2013. On aspirin for heart.  Triglycerides are normal range, no recent travel, not a diabetic, no scorpion bite.     ROS: All systems reviewed and otherwise negative except for as above  Family History  Problem Relation Age of Onset  . Cancer Father     lung  . Heart disease Mother   . Diabetes Mother   . Hypertension Mother   . Colon cancer Neg Hx   . Esophageal cancer Neg Hx   . Stomach cancer Neg Hx   . Rectal cancer Neg Hx   . CAD Son 52    MI - deceased    Past Medical History  Diagnosis Date  . IBS (irritable bowel syndrome)   . COPD (chronic obstructive pulmonary disease)   . Asthma   . Lung nodule     RLL nodule-76mm stable 2006, April 2009, and June 2009  . Anemia   . Hyperlipemia   . Obesity   . Essential hypertension   . Sleep apnea     PT TOLD  BORDERLINE-TRIED CPAP-DID NOT HELP-SHE DOES NOT USE CPAP  . Arthritis   . History of radiation therapy 05/30/11 to 07/07/11    rectum  . History of pneumonia     several times  . History of shingles   . Rectal cancer 04/2011    T3N0; s/p lap LAR, s/p ileostomy, s/p reversal, s/p chemo  . Severe aortic valve stenosis  AVR 3/15 10/01/2013    mild-mod by echo 02/2014  . Rheumatic heart disease mitral stenosis     mod MS by echo 02/2014  . Anxiety   . S/P AVR (aortic valve replacement) 2015    bioprosthetic (Bartle)  . S/P MVR (mitral valve replacement) 2015    bioprosthetic (Bartle)  . DDD (degenerative disc disease)     cervical - kyphosis with mod DD changes C5/6 and C6/7; lumbar - early DD at L2/3 (Elsner)  . Chronic diastolic heart failure 11/91/4782    Past Surgical History  Procedure Laterality Date  . Foot surgery  left foot    hammer toe  . Hand surgery  Bil    carpal tunnel  . Back surgery  2006, 2007    2006 SPACER, 2007 decompression and fusion L4/5  . Tonsillectomy    . Colon resection  08/25/2011    Procedure: COLON RESECTION LAPAROSCOPIC;  Surgeon: Stark Klein, MD;  Location: [facility];  Service: General;  Laterality: N/A;  Laparoscopic Assisted Low Anterior Resection Diverting Ostomy and onQ pain pump  . Ileostomy  08/25/2011  . Ileostomy closure  04/02/2012    Procedure: ILEOSTOMY TAKEDOWN;  Surgeon: Stark Klein, MD;  Location: [facility];  Service: General;  Laterality: N/A;  . Bowel resection  04/02/2012    Procedure: SMALL BOWEL RESECTION;  Surgeon: Stark Klein, MD;  Location: [facility];  Service: General;  Laterality: N/A;  . Carpal tunnel release    . Cardiac catheterization  11/27/2013  . Tee without cardioversion N/A 11/29/2013    Procedure: TRANSESOPHAGEAL ECHOCARDIOGRAM (TEE);  Surgeon: Sueanne Margarita, MD;  Location: [place];  Service: Cardiovascular;  Laterality: N/A;  . Aortic valve replacement N/A 12/12/2013    Procedure: AORTIC VALVE REPLACEMENT (AVR);   Surgeon: Gaye Pollack, MD; Service: Open Heart Surgery  . Mitral valve replacement N/A 12/12/2013    Procedure: MITRAL VALVE (MV) REPLACEMENT OR REPAIR;  Surgeon: Gaye Pollack, MD; Service: Open Heart Surgery  . Intraoperative transesophageal echocardiogram N/A 12/12/2013    Procedure: INTRAOPERATIVE TRANSESOPHAGEAL ECHOCARDIOGRAM;  Surgeon: Gaye Pollack, MD;  Location: [facility] OR;  Service: Open Heart Surgery;  Laterality: N/A;  . Colonoscopy  03/2013    1 polyp, rpt 3 yrs Ardis Hughs)  . Left and right heart catheterization with coronary angiogram N/A 11/27/2013    Procedure: LEFT AND RIGHT HEART CATHETERIZATION WITH CORONARY ANGIOGRAM;  Surgeon: Sanda Klein, MD;  Location: [place] CATH [facility];  Service: Cardiovascular;  Laterality: N/A;    Social History:  reports that she quit smoking about 39 years ago. Her smoking use included Cigarettes. She has a 15 pack-year smoking history. She has never used smokeless tobacco. She reports that she does not drink alcohol or use illicit drugs.  Allergies:  Allergies  Allergen Reactions  . Codeine Nausea Only  . Prednisone Hives    In different places all over the body.  . Sulfonamide Derivatives Nausea Only    Medications Prior to Admission  Medication Sig Dispense Refill  . aspirin 81 MG tablet Take 81 mg by mouth at bedtime.     Marland Kitchen azithromycin (ZITHROMAX Z-PAK) 250 MG tablet 2 day 1, then 1 qd (Patient taking differently: Take 250 mg by mouth See admin instructions. 2 day 1, then 1 qd) 6 tablet 0  . BIOTIN PO Take 1 tablet by mouth at bedtime.     Marland Kitchen BREO ELLIPTA 100-25 MCG/INH AEPB inhale 1 puff by mouth once daily 60 each 5  . Calcium Carbonate-Vitamin D (CALCIUM 600 + D PO) Take 1,200 mg by mouth at bedtime.     . Cholecalciferol (VITAMIN D) 2000 UNITS CAPS Take 1 capsule by mouth at bedtime.     . furosemide (LASIX) 40 MG tablet Take 1 tablet (40 mg total) by mouth daily. 30 tablet 11  . ipratropium-albuterol (DUONEB) 0.5-2.5 (3) MG/3ML SOLN Take 3 mLs  by nebulization every 6 (six) hours as needed. 360 mL 0  . lovastatin (MEVACOR) 40 MG tablet take 1 tablet by mouth at bedtime 30 tablet 6  . methylPREDNISolone (MEDROL) 8 MG tablet Take 2 tablets (16 mg total) by mouth 2 (two) times daily. 14 tablet 0  . Miconazole Nitrate 2 % POWD 1 application by Does not apply route daily as needed (for skin). Apply to stomach and under breasts    . montelukast (SINGULAIR) 10 MG tablet  Take 1 tablet (10 mg total) by mouth at bedtime. 30 tablet 11  . pantoprazole (PROTONIX) 40 MG tablet Take 1 tablet (40 mg total) by mouth at bedtime. 90 tablet 2  . polyethylene glycol (MIRALAX / GLYCOLAX) packet Take 17 g by mouth daily. 14 each 0  . potassium chloride (K-DUR,KLOR-CON) 10 MEQ tablet Take 2 tablets (20 mEq total) by mouth daily. 60 tablet 11  . SPIRIVA HANDIHALER 18 MCG inhalation capsule inhale the contents of one capsule in the handihaler once daily 30 capsule 4  . traMADol (ULTRAM) 50 MG tablet Take 50 mg by mouth every 6 (six) hours as needed for moderate pain.    Marland Kitchen albuterol (PROVENTIL HFA;VENTOLIN HFA) 108 (90 BASE) MCG/ACT inhaler Inhale 2 puffs into the lungs every 6 (six) hours as needed for wheezing.      Blood pressure 150/75, pulse 73, temperature 97.9 F (36.6 C), temperature source Oral, resp. rate 18, height $RemoveBe'5\' 6"'VnDeVnfaV$  (1.676 m), weight 216 lb 1.6 oz (98.022 kg), SpO2 100 %. Physical Exam: General: pleasant, WD/WN white female who is laying in bed in NAD HEENT: head is normocephalic, atraumatic.  Sclera are noninjected.  PERRL.  Ears and nose without any masses or lesions.  Mouth is pink and moist Heart: regular, rate, and rhythm.  No obvious gallops, or rubs noted.  +murmur heard.  Palpable pedal pulses bilaterally Lungs: CTAB, no wheezes, rhonchi, or rales noted.  Respiratory effort nonlabored Abd: soft, NT/ND, +BS, negative murphy's sign, no masses, hernias, or organomegaly, scars well healed over abdomen MS: all 4 extremities are symmetrical  with no cyanosis, clubbing, or edema. Skin: warm and dry with no masses, lesions, or rashes Psych: A&Ox3 with an appropriate affect.   Results for orders placed or performed during the hospital encounter of 11/16/14 (from the past 48 hour(s))  CBC     Status: Abnormal   Collection Time: 11/16/14  2:47 PM  Result Value Ref Range   WBC 15.4 (H) 4.0 - 10.5 K/uL   RBC 4.20 3.87 - 5.11 MIL/uL   Hemoglobin 12.1 12.0 - 15.0 g/dL   HCT 37.4 36.0 - 46.0 %   MCV 89.0 78.0 - 100.0 fL   MCH 28.8 26.0 - 34.0 pg   MCHC 32.4 30.0 - 36.0 g/dL   RDW 14.4 11.5 - 15.5 %   Platelets 173 150 - 400 K/uL  Basic metabolic panel     Status: Abnormal   Collection Time: 11/16/14  2:47 PM  Result Value Ref Range   Sodium 141 135 - 145 mmol/L   Potassium 4.2 3.5 - 5.1 mmol/L   Chloride 103 96 - 112 mmol/L   CO2 30 19 - 32 mmol/L   Glucose, Bld 142 (H) 70 - 99 mg/dL   BUN 25 (H) 6 - 23 mg/dL   Creatinine, Ser 1.29 (H) 0.50 - 1.10 mg/dL   Calcium 9.7 8.4 - 10.5 mg/dL   GFR calc non Af Amer 41 (L) >90 mL/min   GFR calc Af Amer 48 (L) >90 mL/min    Comment: (NOTE) The eGFR has been calculated using the CKD EPI equation. This calculation has not been validated in all clinical situations. eGFR's persistently <90 mL/min signify possible Chronic Kidney Disease.    Anion gap 8 5 - 15  BNP (order ONLY if patient complains of dyspnea/SOB AND you have documented it for THIS visit)     Status: Abnormal   Collection Time: 11/16/14  2:47 PM  Result Value Ref Range  B Natriuretic Peptide 211.0 (H) 0.0 - 100.0 pg/mL  I-stat troponin, ED (not at [facility])     Status: None   Collection Time: 11/16/14  3:00 PM  Result Value Ref Range   Troponin i, poc 0.04 0.00 - 0.08 ng/mL   Comment 3            Comment: Due to the release kinetics of cTnI, a negative result within the first hours of the onset of symptoms does not rule out myocardial infarction with certainty. If myocardial infarction is still suspected, repeat the  test at appropriate intervals.   Hepatic function panel     Status: Abnormal   Collection Time: 11/16/14  4:07 PM  Result Value Ref Range   Total Protein 6.3 6.0 - 8.3 g/dL   Albumin 3.3 (L) 3.5 - 5.2 g/dL   AST 465 (H) 0 - 37 U/L   ALT 208 (H) 0 - 35 U/L   Alkaline Phosphatase 133 (H) 39 - 117 U/L   Total Bilirubin 1.0 0.3 - 1.2 mg/dL   Bilirubin, Direct 0.4 0.0 - 0.5 mg/dL   Indirect Bilirubin 0.6 0.3 - 0.9 mg/dL  Lipase, blood     Status: Abnormal   Collection Time: 11/16/14  4:07 PM  Result Value Ref Range   Lipase 1377 (H) 11 - 59 U/L    Comment: RESULTS CONFIRMED BY MANUAL DILUTION  Troponin I     Status: Abnormal   Collection Time: 11/16/14  4:51 PM  Result Value Ref Range   Troponin I 0.09 (H) <0.031 ng/mL    Comment:        PERSISTENTLY INCREASED TROPONIN VALUES IN THE RANGE OF 0.04-0.49 ng/mL CAN BE SEEN IN:       -UNSTABLE ANGINA       -CONGESTIVE HEART FAILURE       -MYOCARDITIS       -CHEST TRAUMA       -ARRYHTHMIAS       -LATE PRESENTING MYOCARDIAL INFARCTION       -COPD   CLINICAL FOLLOW-UP RECOMMENDED.   Lactate dehydrogenase     Status: Abnormal   Collection Time: 11/16/14  6:35 PM  Result Value Ref Range   LDH 364 (H) 94 - 250 U/L  Troponin I (q 6hr x 3)     Status: Abnormal   Collection Time: 11/17/14 12:51 AM  Result Value Ref Range   Troponin I 0.09 (H) <0.031 ng/mL    Comment:        PERSISTENTLY INCREASED TROPONIN VALUES IN THE RANGE OF 0.04-0.49 ng/mL CAN BE SEEN IN:       -UNSTABLE ANGINA       -CONGESTIVE HEART FAILURE       -MYOCARDITIS       -CHEST TRAUMA       -ARRYHTHMIAS       -LATE PRESENTING MYOCARDIAL INFARCTION       -COPD   CLINICAL FOLLOW-UP RECOMMENDED.   CBC     Status: Abnormal   Collection Time: 11/17/14  6:25 AM  Result Value Ref Range   WBC 11.3 (H) 4.0 - 10.5 K/uL   RBC 4.80 3.87 - 5.11 MIL/uL   Hemoglobin 14.2 12.0 - 15.0 g/dL   HCT 42.7 36.0 - 46.0 %   MCV 89.0 78.0 - 100.0 fL   MCH 29.6 26.0 - 34.0 pg    MCHC 33.3 30.0 - 36.0 g/dL   RDW 14.7 11.5 - 15.5 %   Platelets 153 150 - 400  K/uL  Comprehensive metabolic panel     Status: Abnormal   Collection Time: 11/17/14  6:25 AM  Result Value Ref Range   Sodium 138 135 - 145 mmol/L   Potassium 3.7 3.5 - 5.1 mmol/L   Chloride 102 96 - 112 mmol/L   CO2 29 19 - 32 mmol/L   Glucose, Bld 78 70 - 99 mg/dL   BUN 23 6 - 23 mg/dL   Creatinine, Ser 1.25 (H) 0.50 - 1.10 mg/dL   Calcium 9.4 8.4 - 10.5 mg/dL   Total Protein 7.4 6.0 - 8.3 g/dL   Albumin 3.7 3.5 - 5.2 g/dL   AST 455 (H) 0 - 37 U/L   ALT 447 (H) 0 - 35 U/L   Alkaline Phosphatase 161 (H) 39 - 117 U/L   Total Bilirubin 0.9 0.3 - 1.2 mg/dL   GFR calc non Af Amer 43 (L) >90 mL/min   GFR calc Af Amer 50 (L) >90 mL/min    Comment: (NOTE) The eGFR has been calculated using the CKD EPI equation. This calculation has not been validated in all clinical situations. eGFR's persistently <90 mL/min signify possible Chronic Kidney Disease.    Anion gap 7 5 - 15  Lipase, blood     Status: Abnormal   Collection Time: 11/17/14  6:25 AM  Result Value Ref Range   Lipase 264 (H) 11 - 59 U/L  Troponin I (q 6hr x 3)     Status: Abnormal   Collection Time: 11/17/14  6:25 AM  Result Value Ref Range   Troponin I 0.08 (H) <0.031 ng/mL    Comment:        PERSISTENTLY INCREASED TROPONIN VALUES IN THE RANGE OF 0.04-0.49 ng/mL CAN BE SEEN IN:       -UNSTABLE ANGINA       -CONGESTIVE HEART FAILURE       -MYOCARDITIS       -CHEST TRAUMA       -ARRYHTHMIAS       -LATE PRESENTING MYOCARDIAL INFARCTION       -COPD   CLINICAL FOLLOW-UP RECOMMENDED.    *Note: Due to a large number of results and/or encounters for the requested time period, some results have not been displayed. A complete set of results can be found in Results Review.   Dg Chest 2 View  11/16/2014   CLINICAL DATA:  Chest pain for 1 day  EXAM: CHEST  2 VIEW  COMPARISON:  CT 08/25/2014, chest radiograph 08/25/2014, chest CT dating back to  03/15/2012  FINDINGS: The heart size and mediastinal contours are within normal limits. Both lungs are clear with the exception of stable right upper lobe nodularity suggesting a benign process such as scarring given stability since prior exam dating back to at least 03/15/2012. Right pleural thickening is stable. The visualized skeletal structures are unremarkable.  IMPRESSION: No active cardiopulmonary disease.   Electronically Signed   By: Conchita Paris [name]   On: 11/16/2014 15:48   Ct Abdomen Pelvis W Contrast  11/16/2014   CLINICAL DATA:  Initial evaluation for chest pressure, abnormal labs, personal history of rectal carcinoma  EXAM: CT ABDOMEN AND PELVIS WITH CONTRAST  TECHNIQUE: Multidetector CT imaging of the abdomen and pelvis was performed using the standard protocol following bolus administration of intravenous contrast.  CONTRAST:  73mL OMNIPAQUE IOHEXOL 300 MG/ML SOLN, 147mL OMNIPAQUE IOHEXOL 300 MG/ML SOLN  COMPARISON:  07/23/2014  FINDINGS: Small right pleural effusion with irregular pleural thickening at the lateral and posterior  right lung base, stable from prior study. Mild bibasilar atelectasis. Heavy mitral annulus calcification stable.  Liver is normal. Questionable minimal gallbladder wall thickening and pericholecystic stranding. No biliary dilatation. Spleen and pancreas normal.  Adrenal glands normal.  Small renal cysts stable.  Calcification of the abdominal aorta. No significant mesenteric or retroperitoneal adenopathy.  Stomach and small bowel are normal. Appendix is normal. Postsurgical change at the rectosigmoid junction associated with presacral soft tissue thickening, stable.  Bladder is normal. Reproductive organs show no acute abnormalities. No acute musculoskeletal findings.  IMPRESSION: 1. Questionable minimal gallbladder wall thickening and minimal pericholecystic inflammation. Correlate clinically and consider right upper quadrant ultrasound if indicated. 2. Stable  irregular right pleural thickening, bilateral renal cysts, and postsurgical change at the rectosigmoid junction with stable presacral soft tissue thickening.   Electronically Signed   By: Skipper Cliche [name]   On: 11/16/2014 20:46   US Abdomen Limited Ruq  11/17/2014   CLINICAL DATA:  Abnormal appearance of the gallbladder on recent CT. Chest pressure and elevated lipase level.  EXAM: US ABDOMEN LIMITED - RIGHT UPPER QUADRANT  COMPARISON:  11/16/2014  FINDINGS: Gallbladder:  Small amount of echogenic material in the gallbladder could represent sludge. There is no evidence for gallbladder wall thickening. No evidence for gallstones.  Common bile duct:  Diameter: 9 mm.  Liver:  No focal lesion identified. Within normal limits in parenchymal echogenicity.  IMPRESSION: Small amount of sludge in the gallbladder. No evidence for cholelithiasis.  Common bile duct is prominent and similar to the recent CT.   Electronically Signed   By: Markus Daft [name]   On: 11/17/2014 11:42      Assessment/Plan Acute Pancreatitis - lipase improving Epigastric abdominal pain Gallbladder sludge without stones Transaminitis - worsening Elevated Alk Phos Recent URI sx and COPD exacerbation   Plan: 1.  Unsure if she truly has pancreatitis from gallstones since none seen on [place] or CT scan does have sludge.  Will order HIDA scan to check for obstruction of the cystic duct and r/o acute cholecystitis.  Follow labs, get hepatitis panel. 2.  Continue with clears for now as long as pain does not return, IVF, pain control, antiemetics, no need for antibiotics 3.  SCD's and lovenox for DVT proph, on aspirin 4.  Ambulate and IS 5.  Make NPO after midnight for anticipated HIDA scan in the morning (11/17/14), also hold all narcotic pain medications as these alter the test results.     Coralie Keens, [facility] Surgery 11/17/2014, 3:05 PM Pager: [phone]

## 2014-11-18 ENCOUNTER — Inpatient Hospital Stay (HOSPITAL_COMMUNITY): Payer: Medicare Other

## 2014-11-18 ENCOUNTER — Encounter (HOSPITAL_COMMUNITY): Admission: RE | Admit: 2014-11-18 | Payer: Self-pay | Source: Ambulatory Visit

## 2014-11-18 ENCOUNTER — Inpatient Hospital Stay: Admission: RE | Admit: 2014-11-18 | Payer: 59 | Source: Ambulatory Visit

## 2014-11-18 DIAGNOSIS — K851 Biliary acute pancreatitis: Secondary | ICD-10-CM

## 2014-11-18 DIAGNOSIS — J209 Acute bronchitis, unspecified: Secondary | ICD-10-CM

## 2014-11-18 LAB — COMPREHENSIVE METABOLIC PANEL
ALT: 247 U/L — ABNORMAL HIGH (ref 0–35)
AST: 149 U/L — ABNORMAL HIGH (ref 0–37)
Albumin: 3 g/dL — ABNORMAL LOW (ref 3.5–5.2)
Alkaline Phosphatase: 121 U/L — ABNORMAL HIGH (ref 39–117)
Anion gap: 8 (ref 5–15)
BUN: 15 mg/dL (ref 6–23)
CO2: 23 mmol/L (ref 19–32)
Calcium: 9 mg/dL (ref 8.4–10.5)
Chloride: 108 mmol/L (ref 96–112)
Creatinine, Ser: 0.93 mg/dL (ref 0.50–1.10)
GFR calc Af Amer: 71 mL/min — ABNORMAL LOW (ref >90)
GFR calc non Af Amer: 61 mL/min — ABNORMAL LOW (ref >90)
Glucose, Bld: 79 mg/dL (ref 70–99)
Potassium: 4.4 mmol/L (ref 3.5–5.1)
Sodium: 139 mmol/L (ref 135–145)
Total Bilirubin: 2.8 mg/dL — ABNORMAL HIGH (ref 0.3–1.2)
Total Protein: 5.7 g/dL — ABNORMAL LOW (ref 6.0–8.3)

## 2014-11-18 LAB — CBC
HCT: 33 % — ABNORMAL LOW (ref 36.0–46.0)
Hemoglobin: 10.9 g/dL — ABNORMAL LOW (ref 12.0–15.0)
MCH: 29.4 pg (ref 26.0–34.0)
MCHC: 33 g/dL (ref 30.0–36.0)
MCV: 88.9 fL (ref 78.0–100.0)
Platelets: 131 10*3/uL — ABNORMAL LOW (ref 150–400)
RBC: 3.71 MIL/uL — ABNORMAL LOW (ref 3.87–5.11)
RDW: 14.7 % (ref 11.5–15.5)
WBC: 5.8 10*3/uL (ref 4.0–10.5)

## 2014-11-18 LAB — HEPATITIS PANEL, ACUTE
HCV Ab: NEGATIVE
Hep A IgM: NONREACTIVE
Hep B C IgM: NONREACTIVE
Hepatitis B Surface Ag: NEGATIVE

## 2014-11-18 MED ORDER — SINCALIDE 5 MCG IJ SOLR
INTRAMUSCULAR | Status: AC
  Administered 2014-11-18: 1.96 ug
  Filled 2014-11-18: qty 5

## 2014-11-18 MED ORDER — POLYETHYLENE GLYCOL 3350 17 G PO PACK
17.0000 g | PACK | Freq: Two times a day (BID) | ORAL | Status: DC
  Filled 2014-11-18 (×3): qty 1

## 2014-11-18 MED ORDER — TECHNETIUM TC 99M MEBROFENIN IV KIT
5.0000 | PACK | Freq: Once | INTRAVENOUS | Status: AC | PRN
  Administered 2014-11-18: 5 via INTRAVENOUS

## 2014-11-18 MED ORDER — DOCUSATE SODIUM 100 MG PO CAPS
200.0000 mg | ORAL_CAPSULE | Freq: Two times a day (BID) | ORAL | Status: DC
  Administered 2014-11-18 – 2014-11-19 (×2): 200 mg via ORAL
  Filled 2014-11-18 (×3): qty 2

## 2014-11-18 MED ORDER — LEVOFLOXACIN 750 MG PO TABS
750.0000 mg | ORAL_TABLET | Freq: Every day | ORAL | Status: DC
  Filled 2014-11-18: qty 1

## 2014-11-18 NOTE — Progress Notes (Signed)
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 LFT's elevated.. Surgery is following.

## 2014-11-18 NOTE — Progress Notes (Signed)
HIDA scan positive for biliary dyskinesia.  Will likely need to have her gallbladder removed at some point.  Certainly does not have to be done urgently as the patient is symptom free.  Would recommend feeding her to ensure she does not have abdominal pain.  Then can follow up with Dr. Barry Dienes on outpatient basis for consideration for a cholecystectomy.  Further recommendations per primary team and GI.   Edrik Rundle, ANP-BC

## 2014-11-18 NOTE — Care Management Note (Signed)
    Page 1 of 1   11/19/2014     4:26:03 PM CARE MANAGEMENT NOTE 11/19/2014  Patient:  [name], [name]   Account Number:  [id number]  Date Initiated:  11/18/2014  Documentation initiated by:  Ted Leonhart  Subjective/Objective Assessment:   Pt adm on 11/16/14 with pancreatitis.  PTA, pt independent, lives with spouse.     Action/Plan:   Will follow for dc needs as pt progresses.   Anticipated DC Date:  11/20/2014   Anticipated DC Plan:  [place]  CM consult      Choice offered to / List presented to:             Status of service:  Completed, signed off Medicare Important Message given?  YES (If response is "NO", the following Medicare IM given date fields will be blank) Date Medicare IM given:  11/19/2014 Medicare IM given by:  Kaelah Hayashi Date Additional Medicare IM given:   Additional Medicare IM given by:    Discharge Disposition:    Per UR Regulation:  Reviewed for med. necessity/level of care/duration of stay  If discussed at [place] of Stay Meetings, dates discussed:    Comments:

## 2014-11-18 NOTE — Progress Notes (Signed)
Patient Demographics  [name], is a 69 y.o. female, DOB - May 21, 1946, LDJ:[id number]  Admit date - 11/16/2014   Admitting Physician Eber Jones, MD  Outpatient Primary MD for the patient is Ria Bush, MD  LOS - 2   Chief Complaint  Patient presents with  . Chest Pain      Admission history of present illness/brief narrative: [name] is a 69 y.o. female with a history of rectal cancer, aortic and mitral valve replacements, and COPD who presented to ED with acute onset of epigastric pain. T she had unexplained chills and sweats, No documented fever. She was seen in her PCP's office and diagnosed with acute bronchitis/acute exacerbation of COPD. She has been taking azithromycin and a Medrol dose pack as prescribed.  she experienced the sudden onset of 8 out of 10 pressure-like pain in her epigastric area that radiated to the middle of her back. associated with nausea or vomiting She took two tramadol at home, which provided some relief. However, her husband urged her to present for further evaluation. She had similar symptoms in the past and was diagnosed with pericarditis at that time. Evaluation in the ED shows elevated LFTs (non-obstructive pattern) and a lipase of 1377. She is being admitted for management of acute pancreatitis. CT showing mild inflammatory changes around the gallbladder.Patient had right upper quadrant ultrasound which was significant for small sludge in gallbladder, but no cholelithiasis, patient was seen by surgery, who recommended by the scan, as well as gastroenterology evaluating the patient for abnormal LFT.  Subjective:   [name] today has, No headache, No chest pain, No further abdominal pain - no nausea or vomiting since in addition, No new weakness tingling or numbness, still reports some cough with  productive sputum.  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 - LFTs are elevated, AST and ALT are improving, negative hepatitis panel, total bili is elevated today?? 0.9>> 2.8 today. - Gastroenterology consult appreciated, awaiting HIDA scan. - continue with IV fluids, when necessary nausea and pain medications. - We'll advance diet as tolerated after scan. - DC IV levofloxacin 2/8>> 2/9, giving no evidence of cholecystitis on ultrasound  Elevated troponins - Most likely related to pancreatitis, dehydration, mild renal failure. -Continue to follow the trend - Denies any chest pain or shortness of breath  Mild acute kidney injury - Continue with IV fluids, improving  Acute bronchitis/history of COPD - Has decreased air entry, minimal wheezing, continue with methylprednisolone taper   Rectal cancer survivor.  -CT changes stable.   Hx of Aortic and mitral valve replacements, bioprosthetic. - Baby aspirin only.     Code Status: Full  Family Communication:  None at bedside  Disposition Plan: Home when stable   Procedures  None   Consults  Gastroenterology Gen. surgery Medications  Scheduled Meds: . aspirin  EC  81 mg Oral QHS  . enoxaparin (LOVENOX) injection  50 mg Subcutaneous Q24H  . Fluticasone Furoate-Vilanterol  1 puff Inhalation Q1200  . levofloxacin  750 mg Oral Daily  . montelukast  10 mg Oral QHS  . pantoprazole  40 mg Oral QHS  . sodium chloride  3 mL Intravenous Q12H  . tiotropium  18 mcg Inhalation Daily    Continuous Infusions: . sodium chloride 100 mL/hr at 11/18/14 0900   PRN Meds:.HYDROmorphone (DILAUDID) injection, ipratropium-albuterol, ondansetron **OR** ondansetron (ZOFRAN) IV, traMADol  DVT Prophylaxis  Lovenox  Lab Results  Component Value Date   PLT 131* 11/18/2014    Antibiotics    Anti-infectives    Start     Dose/Rate Route Frequency Ordered Stop   11/18/14 1200  levofloxacin (LEVAQUIN) tablet 750 mg     750 mg Oral Daily 11/18/14 0900     11/17/14 1000  azithromycin (ZITHROMAX) tablet 250 mg  Status:  Discontinued     250 mg Oral Daily 11/16/14 2321 11/17/14 0726   11/17/14 0745  levofloxacin (LEVAQUIN) IVPB 750 mg  Status:  Discontinued     750 mg100 mL/hr over 90 Minutes Intravenous Every 24 hours 11/17/14 0730 11/18/14 0900          Objective:   Filed Vitals:   11/17/14 2043 11/18/14 0214 11/18/14 0507 11/18/14 0815  BP: 136/58 142/59 139/70   Pulse: 77 79 88   Temp: 97.4 F (36.3 C) 97.6 F (36.4 C) 97.4 F (36.3 C)   TempSrc: Oral Oral Oral   Resp: 18 18 18    Height:      Weight:   98.2 kg (216 lb 7.9 oz)   SpO2: 100% 100% 100% 98%    Wt Readings from Last 3 Encounters:  11/18/14 98.2 kg (216 lb 7.9 oz)  11/14/14 98.204 kg (216 lb 8 oz)  10/15/14 96.389 kg (212 lb 8 oz)     Intake/Output Summary (Last 24 hours) at 11/18/14 1520 Last data filed at 11/18/14 1335  Gross per 24 hour  Intake   3060 ml  Output   2000 ml  Net   1060 ml     Physical Exam  Awake Alert, Oriented X 3, No new F.N deficits, Normal affect [name],PERRAL Supple Neck,No JVD, No cervical lymphadenopathy appriciated.  Symmetrical Chest wall movement, Good air movement bilaterally, CTAB RRR,No Gallops,Rubs or new Murmurs, No Parasternal Heave +ve B.Sounds, Abd Soft, No tenderness, No organomegaly appriciated, No rebound - guarding or rigidity. No Cyanosis, Clubbing or edema, No new Rash or bruise     Data Review   Micro Results No results found for this or  any previous visit (from the past 240 hour(s)).  Radiology Reports Ct Abdomen Pelvis W Contrast  11/16/2014   CLINICAL DATA:  Initial evaluation for chest pressure, abnormal labs, personal history of rectal carcinoma  EXAM: CT ABDOMEN AND PELVIS WITH CONTRAST  TECHNIQUE: Multidetector CT imaging of the abdomen and pelvis was performed using the standard protocol following bolus administration of intravenous contrast.  CONTRAST:  19mL OMNIPAQUE IOHEXOL 300 MG/ML SOLN, 137mL OMNIPAQUE IOHEXOL 300 MG/ML SOLN  COMPARISON:  07/23/2014  FINDINGS: Small right pleural effusion with irregular pleural thickening at the lateral and posterior right lung base, stable from prior study. Mild bibasilar atelectasis. Heavy mitral annulus calcification stable.  Liver is normal. Questionable minimal gallbladder wall thickening and pericholecystic stranding. No biliary dilatation. Spleen and pancreas normal.  Adrenal glands normal.  Small renal cysts stable.  Calcification  of the abdominal aorta. No significant mesenteric or retroperitoneal adenopathy.  Stomach and small bowel are normal. Appendix is normal. Postsurgical change at the rectosigmoid junction associated with presacral soft tissue thickening, stable.  Bladder is normal. Reproductive organs show no acute abnormalities. No acute musculoskeletal findings.  IMPRESSION: 1. Questionable minimal gallbladder wall thickening and minimal pericholecystic inflammation. Correlate clinically and consider right upper quadrant ultrasound if indicated. 2. Stable irregular right pleural thickening, bilateral renal cysts, and postsurgical change at the rectosigmoid junction with stable presacral soft tissue thickening.   Electronically Signed   By: Skipper Cliche [name]   On: 11/16/2014 20:46   US Abdomen Limited Ruq  11/17/2014   CLINICAL DATA:  Abnormal appearance of the gallbladder on recent CT. Chest pressure and elevated lipase level.  EXAM: US ABDOMEN LIMITED - RIGHT UPPER QUADRANT   COMPARISON:  11/16/2014  FINDINGS: Gallbladder:  Small amount of echogenic material in the gallbladder could represent sludge. There is no evidence for gallbladder wall thickening. No evidence for gallstones.  Common bile duct:  Diameter: 9 mm.  Liver:  No focal lesion identified. Within normal limits in parenchymal echogenicity.  IMPRESSION: Small amount of sludge in the gallbladder. No evidence for cholelithiasis.  Common bile duct is prominent and similar to the recent CT.   Electronically Signed   By: Markus Daft [name]   On: 11/17/2014 11:42    CBC  Recent Labs Lab 11/16/14 1447 11/17/14 0625 11/18/14 0456  WBC 15.4* 11.3* 5.8  HGB 12.1 14.2 10.9*  HCT 37.4 42.7 33.0*  PLT 173 153 131*  MCV 89.0 89.0 88.9  MCH 28.8 29.6 29.4  MCHC 32.4 33.3 33.0  RDW 14.4 14.7 14.7    Chemistries   Recent Labs Lab 11/16/14 1447 11/16/14 1607 11/17/14 0625 11/18/14 0456  NA 141  --  138 139  K 4.2  --  3.7 4.4  CL 103  --  102 108  CO2 30  --  29 23  GLUCOSE 142*  --  78 79  BUN 25*  --  23 15  CREATININE 1.29*  --  1.25* 0.93  CALCIUM 9.7  --  9.4 9.0  AST  --  465* 455* 149*  ALT  --  208* 447* 247*  ALKPHOS  --  133* 161* 121*  BILITOT  --  1.0 0.9 2.8*   ------------------------------------------------------------------------------------------------------------------ estimated creatinine clearance is 67.5 mL/min (by C-G formula based on Cr of 0.93). ------------------------------------------------------------------------------------------------------------------ No results for input(s): HGBA1C in the last 72 hours. ------------------------------------------------------------------------------------------------------------------ No results for input(s): CHOL, HDL, LDLCALC, TRIG, CHOLHDL, LDLDIRECT in the last 72 hours. ------------------------------------------------------------------------------------------------------------------ No results for input(s): TSH, T4TOTAL, T3FREE,  THYROIDAB in the last 72 hours.  Invalid input(s): FREET3 ------------------------------------------------------------------------------------------------------------------ No results for input(s): VITAMINB12, FOLATE, FERRITIN, TIBC, IRON, RETICCTPCT in the last 72 hours.  Coagulation profile No results for input(s): INR, PROTIME in the last 168 hours.  No results for input(s): DDIMER in the last 72 hours.  Cardiac Enzymes  Recent Labs Lab 11/16/14 1651 11/17/14 0051 11/17/14 0625  TROPONINI 0.09* 0.09* 0.08*   ------------------------------------------------------------------------------------------------------------------ Invalid input(s): POCBNP     Time Spent in minutes   30 minutes   Patsye Sullivant M.D on 11/18/2014 at 3:20 PM  Between 7am to 7pm - Pager - [phone]  After 7pm go to [email or web address] - password [email or web address]  And look for the night coverage person covering for me after hours  [facility] Office  [phone]   **Disclaimer: This note may have been  dictated with voice recognition software. Similar sounding words can inadvertently be transcribed and this note may contain transcription errors which may not have been corrected upon publication of note.**

## 2014-11-18 NOTE — Consult Note (Signed)
[facility] Consult: 10:18 AM 11/18/2014  LOS: 2 days    Referring Provider: Dr Hartford Poli  Primary Care Physician:  Ria Bush, MD Primary Gastroenterologist:  Dr. Ardis Hughs     Reason for Consultation:  Acute pancreatitis.    HPI: [name] is a 70 y.o. female.   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  Diastolic heart failure.  Pericarditis.  Hx T3N0 mid rectal cancer treated with neo adjuvant chemo/XRTand low anterior resection with diverting loop ileostomy 08/2011.  Ileostomy takedown 03/2013.  Colonoscopy 03/2013 with small TA and baloon dilation of narrowing/edema of LAR anastomosis.   CT abd/pelvis 07/23/2014 for follow up of cancer showed irregular liver margin, ?cirrhosis.  Small ascending aortic aneurysm, aortic valve calcifications. Tiny right pleural effusion.   Admission 11/16 -08/27/14 with chest pain.  Ruled out for ACS, PE.  Etiology never pinned down but ? Of pericarditis vs viral gastroenteritis (n/v during admit).  She did not have LFTs or Lipase checked during the admission.   Pt's 56 y/o son died just after 10/26/14, cause was felt to be cardiac.  Pt is still grieving and anxious over this.   Admitted 2/7 with epigastric pain. Started with chills, sweats, scratchy throat on 2/5, diagnosed with acute COPD flare/bronchitis by MD and treated with medrol dose pack and Azithromycin. On 2/7 evening : had 8-10/10 pressure/pain in epigastrium radiating to mid back. Some relief with Tramadol.  Nausea only on PM of 2/5.  Initial labs with AST/ALT 465/208, Lipase 1377.  All of these now markedly improved.   CT abdomen/pelvis 11/16/14.  ? GB wall thickening, minimal pericholecystic fluid.  Biliary tree is not dilated.  Liver is normal.  Small  right pleural effusion.  Ultrasound abdomen 11/17/14:  ? GB sludge, 9 mm "prominent" CBD,   Surgery has seen pt and not convinced of biliary etiology.  HIDA scan ordered for today, not yet completed.  The pain subsided within a few hours (shorter duration than similar pain of 08/25/14) and now resolved.  Pt does not drink ETOH, use NSAIDs, have hx fatty food intolerance or bothersome GERD sxs.    ENDOSCOPIC STUDIES: 03/2013  Colonoscopy Small, pedunculated sigmoid polyp (tubular adenoma without HGD) removed.  Edema and luminal narrowing of anastomosis was balloon dilated.  Recommend 3 year follow up in 03/2016.   03/2012  Colonoscopy. ENDOSCOPIC IMPRESSION: 1) Mild diversion colitis.  Path: INFLAMED SQUAMOGLANDULAR JUNCTIONAL MUCOSA, CONSISTENT WITH CLINICALLY STATED ILEOSTOMY SITE WITH ISCHEMIC FEATURES.  THERE IS NO EVIDENCE OF MALIGNANCY. 2) Normal appearing LAR anastomosis at 6-7cm from anus RECOMMENDATIONS: [place] to proceed with ileostomy takedown if Barium Enema and CT scan are OK (already set up by Dr. Barry Dienes). Given poor prep (could only use enemas given ileostomy), will plan to repeat this examination in 12 months.  06/2011  Lower EUS Rectal mass extending into muscularis propria, 2 suspicious lymph nodes.   04/2011  Colonoscopy For iron def anemia Findings: malignant appearing rect-sigmoid mass (invasive adenocarcinoma)  , 2 small transverse polyps (tubular  adenomas).    Past Medical History  Diagnosis Date  . IBS (irritable bowel syndrome)   . COPD (chronic obstructive pulmonary disease)   . Asthma   . Lung nodule     RLL nodule-32mm stable 2006, April 2009, and June 2009  . Anemia   . Hyperlipemia   . Obesity   . Essential hypertension   . Sleep apnea     PT TOLD BORDERLINE-TRIED CPAP-DID NOT HELP-SHE DOES NOT USE CPAP  . Arthritis   . History of radiation therapy 05/30/11 to 07/07/11    rectum  . History of pneumonia     several times  . History of shingles   . Rectal  cancer 04/2011    T3N0; s/p lap LAR, s/p ileostomy, s/p reversal, s/p chemo  . Severe aortic valve stenosis  AVR 3/15 10/01/2013    mild-mod by echo 02/2014  . Rheumatic heart disease mitral stenosis     mod MS by echo 02/2014  . Anxiety   . S/P AVR (aortic valve replacement) 2015    bioprosthetic (Bartle)  . S/P MVR (mitral valve replacement) 2015    bioprosthetic (Bartle)  . DDD (degenerative disc disease)     cervical - kyphosis with mod DD changes C5/6 and C6/7; lumbar - early DD at L2/3 ([place]lsner)  . Chronic diastolic heart failure 09/98/3382    Past Surgical History  Procedure Laterality Date  . Foot surgery  left foot    hammer toe  . Hand surgery  Bil    carpal tunnel  . Back surgery  2006, 2007    2006 SPACER, 2007 decompression and fusion L4/5  . Tonsillectomy    . Colon resection  08/25/2011    Procedure: COLON RESECTION LAPAROSCOPIC;  Surgeon: Stark Klein, MD;  Location: [facility];  Service: General;  Laterality: N/A;  Laparoscopic Assisted Low Anterior Resection Diverting Ostomy and onQ pain pump  . Ileostomy  08/25/2011  . Ileostomy closure  04/02/2012    Procedure: ILEOSTOMY TAKEDOWN;  Surgeon: Stark Klein, MD;  Location: [facility];  Service: General;  Laterality: N/A;  . Bowel resection  04/02/2012    Procedure: SMALL BOWEL RESECTION;  Surgeon: Stark Klein, MD;  Location: [facility];  Service: General;  Laterality: N/A;  . Carpal tunnel release    . Cardiac catheterization  11/27/2013  . Tee without cardioversion N/A 11/29/2013    Procedure: TRANSESOPHAGEAL ECHOCARDIOGRAM (TEE);  Surgeon: Sueanne Margarita, MD;  Location: [facility];  Service: Cardiovascular;  Laterality: N/A;  . Aortic valve replacement N/A 12/12/2013    Procedure: AORTIC VALVE REPLACEMENT (AVR);  Surgeon: Gaye Pollack, MD; Service: Open Heart Surgery  . Mitral valve replacement N/A 12/12/2013    Procedure: MITRAL VALVE (MV) REPLACEMENT OR REPAIR;  Surgeon: Gaye Pollack, MD; Service: Open Heart Surgery  .  Intraoperative transesophageal echocardiogram N/A 12/12/2013    Procedure: INTRAOPERATIVE TRANSESOPHAGEAL ECHOCARDIOGRAM;  Surgeon: Gaye Pollack, MD;  Location: [facility];  Service: Open Heart Surgery;  Laterality: N/A;  . Colonoscopy  03/2013    1 polyp, rpt 3 yrs Ardis Hughs)  . Left and right heart catheterization with coronary angiogram N/A 11/27/2013    Procedure: LEFT AND RIGHT HEART CATHETERIZATION WITH CORONARY ANGIOGRAM;  Surgeon: Sanda Klein, MD;  Location: [facility];  Service: Cardiovascular;  Laterality: N/A;    Prior to Admission medications   Medication Sig Start Date End Date Taking? Authorizing Provider  aspirin 81 MG tablet Take 81 mg by mouth at bedtime.  Yes Historical Provider, MD  azithromycin (ZITHROMAX Z-PAK) 250 MG tablet 2 day 1, then 1 qd Patient taking differently: Take 250 mg by mouth See admin instructions. 2 day 1, then 1 qd 11/14/14  Yes Hendricks Limes, MD  BIOTIN PO Take 1 tablet by mouth at bedtime.    Yes Historical Provider, MD  BREO ELLIPTA 100-25 MCG/INH AEPB inhale 1 puff by mouth once daily 04/30/14  Yes Brand Males, MD  Calcium Carbonate-Vitamin D (CALCIUM 600 + D PO) Take 1,200 mg by mouth at bedtime.    Yes Historical Provider, MD  Cholecalciferol (VITAMIN D) 2000 UNITS CAPS Take 1 capsule by mouth at bedtime.    Yes Historical Provider, MD  furosemide (LASIX) 40 MG tablet Take 1 tablet (40 mg total) by mouth daily. 05/12/14  Yes Mihai Croitoru, MD  ipratropium-albuterol (DUONEB) 0.5-2.5 (3) MG/3ML SOLN Take 3 mLs by nebulization every 6 (six) hours as needed. 11/14/14  Yes Hendricks Limes, MD  lovastatin (MEVACOR) 40 MG tablet take 1 tablet by mouth at bedtime   Yes Ria Bush, MD  methylPREDNISolone (MEDROL) 8 MG tablet Take 2 tablets (16 mg total) by mouth 2 (two) times daily. 11/14/14  Yes Hendricks Limes, MD  Miconazole Nitrate 2 % POWD 1 application by Does not apply route daily as needed (for skin). Apply to stomach and under breasts   Yes  Historical Provider, MD  montelukast (SINGULAIR) 10 MG tablet Take 1 tablet (10 mg total) by mouth at bedtime. 01/31/14  Yes Brand Males, MD  pantoprazole (PROTONIX) 40 MG tablet Take 1 tablet (40 mg total) by mouth at bedtime. 04/08/14  Yes Mihai Croitoru, MD  polyethylene glycol (MIRALAX / GLYCOLAX) packet Take 17 g by mouth daily. 12/02/13  Yes Brett Canales, PA-C  potassium chloride (K-DUR,KLOR-CON) 10 MEQ tablet Take 2 tablets (20 mEq total) by mouth daily. 05/12/14  Yes Mihai Croitoru, MD  SPIRIVA HANDIHALER 18 MCG inhalation capsule inhale the contents of one capsule in the handihaler once daily   Yes Brand Males, MD  traMADol (ULTRAM) 50 MG tablet Take 50 mg by mouth every 6 (six) hours as needed for moderate pain.   Yes Historical Provider, MD  albuterol (PROVENTIL HFA;VENTOLIN HFA) 108 (90 BASE) MCG/ACT inhaler Inhale 2 puffs into the lungs every 6 (six) hours as needed for wheezing.    Historical Provider, MD    Scheduled Meds: . aspirin EC  81 mg Oral QHS  . enoxaparin (LOVENOX) injection  50 mg Subcutaneous Q24H  . Fluticasone Furoate-Vilanterol  1 puff Inhalation Q1200  . levofloxacin  750 mg Oral Daily  . montelukast  10 mg Oral QHS  . pantoprazole  40 mg Oral QHS  . sodium chloride  3 mL Intravenous Q12H  . tiotropium  18 mcg Inhalation Daily   Infusions: . sodium chloride 100 mL/hr at 11/18/14 0900   PRN Meds: HYDROmorphone (DILAUDID) injection, ipratropium-albuterol, ondansetron **OR** ondansetron (ZOFRAN) IV, traMADol   Allergies as of 11/16/2014 - Review Complete 11/16/2014  Allergen Reaction Noted  . Codeine Nausea Only   . Prednisone Hives   . Sulfonamide derivatives Nausea Only     Family History  Problem Relation Age of Onset  . Cancer Father     lung  . Heart disease Mother   . Diabetes Mother   . Hypertension Mother   . Colon cancer Neg Hx   . Esophageal cancer Neg Hx   . Stomach cancer Neg Hx   . Rectal cancer Neg  Hx   . CAD Son 70    MI -  deceased    History   Social History  . Marital Status: Married    Spouse Name: N/A    Number of Children: N/A  . Years of Education: N/A   Occupational History  . retired    Social History Main Topics  . Smoking status: Former Smoker -- 1.50 packs/day for 10 years    Types: Cigarettes    Quit date: 12/27/1974  . Smokeless tobacco: Never Used  . Alcohol Use: No     Comment: rare  . Drug Use: No  . Sexual Activity: No   Other Topics Concern  . Not on file   Social History Narrative   Lives with husband and 1 cat.    2 sons; 1 grandchild   Married 1966   Retired-[employer], volunteers at [employer]   Activity: pulm rehab   Diet: some water, fruits/vegetables daily    REVIEW OF SYSTEMS: Constitutional:  Weight stable.  Stable fatigue with longer walks, stairs ENT:  No nose bleeds.  No nasal discharge Pulm:  Stable DOE.  No cough CV:  No palpitations, no LE edema.  GU:  No hematuria, no frequency GI:  Per HPI.  Dysphagia only with large pills Heme:  No recent issues with low blood counts, no unusual bleeding or bruising   Transfusions:  none Neuro:  No headaches, no peripheral tingling or numbness Derm:  No itching, no rash or sores.  Endocrine:  No sweats or chills.  No polyuria or dysuria Immunization:  Reviewed.  Multiple vaccinations up to date.  Travel:  None beyond local counties in last few months.    PHYSICAL EXAM: Vital signs in last 24 hours: Filed Vitals:   11/18/14 0507  BP: 139/70  Pulse: 88  Temp: 97.4 F (36.3 C)  Resp: 18   Wt Readings from Last 3 Encounters:  11/18/14 216 lb 7.9 oz (98.2 kg)  11/14/14 216 lb 8 oz (98.204 kg)  10/15/14 212 lb 8 oz (96.389 kg)   General: looks well, comfortable, obese.   Head:  No swelling or asymmetry  Eyes:  No icterus or pallor Ears:  Not HOH  Nose:  No congestion or discharge Mouth:  Clear, moist.  Several teeth missing or nubs. Neck:  No JVD, bruits or TMG.  No mass. Lungs:  Some exp wheezes.  No ronchi or  rales.  No dyspnea or cough.  Heart: RRR.  No MRG.   Abdomen:  Soft, NT, ND, obese.  Active BS.   Rectal: deferred   Musc/Skeltl: no joint contracture, swelling or redness.  Extremities:  No CCE  Neurologic:  Oriented x 3.  No limb weakness, no tremor Skin:  No: telangectasia, rash, sores, palmar erythema.  Tattoos:  none Nodes:  No cervical adenopathy   Psych:  Pleasant, cooperative.  Tearful with discussing her son's death.   Intake/Output from previous day: 02/08 0701 - 02/09 0700 In: 3253 [P.O.:600; I.V.:2503; IV Piggyback:150] Out: 1950 [WJXBJ:4782] Intake/Output this shift: Total I/O In: 200 [I.V.:200] Out: 200 [Urine:200]  LAB RESULTS:  Recent Labs  11/16/14 1447 11/17/14 0625 11/18/14 0456  WBC 15.4* 11.3* 5.8  HGB 12.1 14.2 10.9*  HCT 37.4 42.7 33.0*  PLT 173 153 131*   BMET Lab Results  Component Value Date   NA 139 11/18/2014   NA 138 11/17/2014   NA 141 11/16/2014   K 4.4 11/18/2014   K 3.7 11/17/2014   K 4.2 11/16/2014  CL 108 11/18/2014   CL 102 11/17/2014   CL 103 11/16/2014   CO2 23 11/18/2014   CO2 29 11/17/2014   CO2 30 11/16/2014   GLUCOSE 79 11/18/2014   GLUCOSE 78 11/17/2014   GLUCOSE 142* 11/16/2014   BUN 15 11/18/2014   BUN 23 11/17/2014   BUN 25* 11/16/2014   CREATININE 0.93 11/18/2014   CREATININE 1.25* 11/17/2014   CREATININE 1.29* 11/16/2014   CALCIUM 9.0 11/18/2014   CALCIUM 9.4 11/17/2014   CALCIUM 9.7 11/16/2014   LFT  Recent Labs  11/16/14 1607 11/17/14 0625 11/18/14 0456  PROT 6.3 7.4 5.7*  ALBUMIN 3.3* 3.7 3.0*  AST 465* 455* 149*  ALT 208* 447* 247*  ALKPHOS 133* 161* 121*  BILITOT 1.0 0.9 2.8*  BILIDIR 0.4  --   --   IBILI 0.6  --   --    PT/INR Lab Results  Component Value Date   INR 1.5 03/24/2014   INR 1.3 03/12/2014   INR 3 02/14/2014   Hepatitis Panel  Recent Labs  11/17/14 1643  HEPBSAG NEGATIVE  HCVAB NEGATIVE  HEPAIGM NON REACTIVE  HEPBIGM NON REACTIVE   C-Diff No components  found for: CDIFF Lipase     Component Value Date/Time   LIPASE 264* 11/17/2014 0625    Drugs of Abuse  No results found for: LABOPIA, COCAINSCRNUR, LABBENZ, AMPHETMU, THCU, LABBARB   RADIOLOGY STUDIES: Dg Chest 2 View  11/16/2014   CLINICAL DATA:  Chest pain for 1 day  EXAM: CHEST  2 VIEW  COMPARISON:  CT 08/25/2014, chest radiograph 08/25/2014, chest CT dating back to 03/15/2012  FINDINGS: The heart size and mediastinal contours are within normal limits. Both lungs are clear with the exception of stable right upper lobe nodularity suggesting a benign process such as scarring given stability since prior exam dating back to at least 03/15/2012. Right pleural thickening is stable. The visualized skeletal structures are unremarkable.  IMPRESSION: No active cardiopulmonary disease.   Electronically Signed   By: Conchita Paris [name]   On: 11/16/2014 15:48   Ct Abdomen Pelvis W Contrast  11/16/2014   CLINICAL DATA:  Initial evaluation for chest pressure, abnormal labs, personal history of rectal carcinoma  EXAM: CT ABDOMEN AND PELVIS WITH CONTRAST  TECHNIQUE: Multidetector CT imaging of the abdomen and pelvis was performed using the standard protocol following bolus administration of intravenous contrast.  CONTRAST:  86mL OMNIPAQUE IOHEXOL 300 MG/ML SOLN, 151mL OMNIPAQUE IOHEXOL 300 MG/ML SOLN  COMPARISON:  07/23/2014  FINDINGS: Small right pleural effusion with irregular pleural thickening at the lateral and posterior right lung base, stable from prior study. Mild bibasilar atelectasis. Heavy mitral annulus calcification stable.  Liver is normal. Questionable minimal gallbladder wall thickening and pericholecystic stranding. No biliary dilatation. Spleen and pancreas normal.  Adrenal glands normal.  Small renal cysts stable.  Calcification of the abdominal aorta. No significant mesenteric or retroperitoneal adenopathy.  Stomach and small bowel are normal. Appendix is normal. Postsurgical change at the  rectosigmoid junction associated with presacral soft tissue thickening, stable.  Bladder is normal. Reproductive organs show no acute abnormalities. No acute musculoskeletal findings.  IMPRESSION: 1. Questionable minimal gallbladder wall thickening and minimal pericholecystic inflammation. Correlate clinically and consider right upper quadrant ultrasound if indicated. 2. Stable irregular right pleural thickening, bilateral renal cysts, and postsurgical change at the rectosigmoid junction with stable presacral soft tissue thickening.   Electronically Signed   By: Skipper Cliche [name]   On: 11/16/2014 20:46   US Abdomen  Limited Ruq  11/17/2014   CLINICAL DATA:  Abnormal appearance of the gallbladder on recent CT. Chest pressure and elevated lipase level.  EXAM: US ABDOMEN LIMITED - RIGHT UPPER QUADRANT  COMPARISON:  11/16/2014  FINDINGS: Gallbladder:  Small amount of echogenic material in the gallbladder could represent sludge. There is no evidence for gallbladder wall thickening. No evidence for gallstones.  Common bile duct:  Diameter: 9 mm.  Liver:  No focal lesion identified. Within normal limits in parenchymal echogenicity.  IMPRESSION: Small amount of sludge in the gallbladder. No evidence for cholelithiasis.  Common bile duct is prominent and similar to the recent CT.   Electronically Signed   By: Markus Daft [name]   On: 11/17/2014 11:42    IMPRESSION:   *  Acute pancreatitis.  ? GB sludge on ultrasound, minor GB wall thickening and pericholecystic fluid on CT.   Surgery not  convinced this is biliary.  HIDA scan orderd by surgery, Dr Brantley Stage.  If th pancreatitis is not biliary it could be med induced, Zithromax (started 2/5) can cause pancreatitis.  No way to know, but wonder if the 08/2015 admission with cp, n/v may have been due to GB or pancreatic disease?   *  Hx adenoca of rectum.  S/p chemo/XRT and LAS 08/2011.  Hx adenomatous colon polyps. Recurrent non-malignant adenomatous polyps on  colonoscopy 03/2013.  Repeat colonoscopy due 03/2016. .   *  S/p bioprosthetic MVR/AVR.  On ASA but no blood thinners.    *  Acute, normocytic anemia.  Suspect some of the Hgb drop is due to hydration/dilution. Baseline Hgb ~ 12.5.   *  Thrombocytopenia.   *  On CT of 07/2014, liver margin irregular and sugg of cirrhosis, but liver described as normal on CT 11/16/14.     PLAN:     *  HIDA scan is pending for later this AM.    Attending MD note:   I have taken a history, examined the patient, and reviewed the chart.Picture consistent with choledocholithiasis/biliary colic, she had a similar but worse episode in the past. LFT's slowly normalizing and abdominal exam is benign today. Sphincter of Oddi spasm is another possibility. HIDA scan may not give [place] an answer unless she has a delayed vis of the small bowl.Zithromycin induced pancreatitis is listed as an adverse reaction and is down on the list.May advance diet since she is pain free and has normal B.S's  [facility] Pager # [phone]    Azucena Freed  11/18/2014, 10:18 AM Pager: [phone]

## 2014-11-18 NOTE — Progress Notes (Signed)
PT self administered BREO (prior to RT visit)- RT scanned PT and scanned medication. RN aware

## 2014-11-18 NOTE — Progress Notes (Signed)
Patient ID: [name], female   DOB: 04-01-1946, 69 y.o.   MRN: [id number]     [facility]      [address] [place], [place] [address]    Phone: [phone] FAX: [phone]     Subjective: Nausea yesterday afternoon, resolved. Denies abdominal pain.   Objective:  Vital signs:  Filed Vitals:   11/17/14 1820 11/17/14 2043 11/18/14 0214 11/18/14 0507  BP: 129/65 136/58 142/59 139/70  Pulse: 68 77 79 88  Temp: 97.9 F (36.6 C) 97.4 F (36.3 C) 97.6 F (36.4 C) 97.4 F (36.3 C)  TempSrc: Oral Oral Oral Oral  Resp: $Remo'18 18 18 18  'wsWhO$ Height:      Weight:    216 lb 7.9 oz (98.2 kg)  SpO2: 100% 100% 100% 100%    Last BM Date: 11/14/14  Intake/Output   Yesterday:  02/08 0701 - 02/09 0700 In: 1103 [P.O.:600; I.V.:2503; IV Piggyback:150] Out: 1950 [Urine:1950] This shift:    I/O last 3 completed shifts: In: 3646.3 [P.O.:600; I.V.:2896.3; IV Piggyback:150] Out: 2650 [Urine:2650]    Physical Exam: General: Pt awake/alert/oriented x4 in no  acute distress Abdomen: Soft.  Nondistended.  Non tender.   No evidence of peritonitis.  No incarcerated hernias.    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, ileostomy takedown 03/2012   Pancreatitis, acute   Acute bronchitis   Pancreatitis    Results:   Labs: Results for orders placed or performed during the hospital encounter of 11/16/14 (from the past 48 hour(s))  CBC     Status: Abnormal   Collection Time: 11/16/14  2:47 PM  Result Value Ref Range   WBC 15.4 (H) 4.0 - 10.5 K/uL   RBC 4.20 3.87 - 5.11 MIL/uL   Hemoglobin 12.1 12.0 - 15.0 g/dL   HCT 37.4 36.0 - 46.0 %   MCV 89.0 78.0 - 100.0 fL   MCH 28.8 26.0 - 34.0 pg   MCHC 32.4 30.0 - 36.0 g/dL   RDW 14.4 11.5 - 15.5 %   Platelets 173 150 - 400 K/uL  Basic metabolic  panel     Status: Abnormal   Collection Time: 11/16/14  2:47 PM  Result Value Ref Range   Sodium 141 135 - 145 mmol/L   Potassium 4.2 3.5 - 5.1 mmol/L   Chloride 103 96 - 112 mmol/L   CO2 30 19 - 32 mmol/L   Glucose, Bld 142 (H) 70 - 99 mg/dL   BUN 25 (H) 6 - 23 mg/dL   Creatinine, Ser 1.29 (H) 0.50 - 1.10 mg/dL   Calcium 9.7 8.4 - 10.5 mg/dL   GFR calc non Af Amer 41 (L) >90 mL/min   GFR calc Af Amer 48 (L) >90 mL/min    Comment: (NOTE) The eGFR has been calculated using the CKD EPI equation. This calculation has not been validated in all clinical situations. eGFR's persistently <90 mL/min signify possible Chronic Kidney Disease.    Anion gap 8 5 - 15  BNP (order ONLY if patient complains of dyspnea/SOB AND you have documented it for THIS visit)     Status: Abnormal   Collection Time: 11/16/14  2:47 PM  Result Value Ref Range   B Natriuretic Peptide 211.0 (H) 0.0 - 100.0 pg/mL  I-stat troponin, ED (not at [facility])     Status:  None   Collection Time: 11/16/14  3:00 PM  Result Value Ref Range   Troponin i, poc 0.04 0.00 - 0.08 ng/mL   Comment 3            Comment: Due to the release kinetics of cTnI, a negative result within the first hours of the onset of symptoms does not rule out myocardial infarction with certainty. If myocardial infarction is still suspected, repeat the test at appropriate intervals.   Hepatic function panel     Status: Abnormal   Collection Time: 11/16/14  4:07 PM  Result Value Ref Range   Total Protein 6.3 6.0 - 8.3 g/dL   Albumin 3.3 (L) 3.5 - 5.2 g/dL   AST 465 (H) 0 - 37 U/L   ALT 208 (H) 0 - 35 U/L   Alkaline Phosphatase 133 (H) 39 - 117 U/L   Total Bilirubin 1.0 0.3 - 1.2 mg/dL   Bilirubin, Direct 0.4 0.0 - 0.5 mg/dL   Indirect Bilirubin 0.6 0.3 - 0.9 mg/dL  Lipase, blood     Status: Abnormal   Collection Time: 11/16/14  4:07 PM  Result Value Ref Range   Lipase 1377 (H) 11 - 59 U/L    Comment: RESULTS CONFIRMED BY MANUAL DILUTION  Troponin I      Status: Abnormal   Collection Time: 11/16/14  4:51 PM  Result Value Ref Range   Troponin I 0.09 (H) <0.031 ng/mL    Comment:        PERSISTENTLY INCREASED TROPONIN VALUES IN THE RANGE OF 0.04-0.49 ng/mL CAN BE SEEN IN:       -UNSTABLE ANGINA       -CONGESTIVE HEART FAILURE       -MYOCARDITIS       -CHEST TRAUMA       -ARRYHTHMIAS       -LATE PRESENTING MYOCARDIAL INFARCTION       -COPD   CLINICAL FOLLOW-UP RECOMMENDED.   Lactate dehydrogenase     Status: Abnormal   Collection Time: 11/16/14  6:35 PM  Result Value Ref Range   LDH 364 (H) 94 - 250 U/L  Troponin I (q 6hr x 3)     Status: Abnormal   Collection Time: 11/17/14 12:51 AM  Result Value Ref Range   Troponin I 0.09 (H) <0.031 ng/mL    Comment:        PERSISTENTLY INCREASED TROPONIN VALUES IN THE RANGE OF 0.04-0.49 ng/mL CAN BE SEEN IN:       -UNSTABLE ANGINA       -CONGESTIVE HEART FAILURE       -MYOCARDITIS       -CHEST TRAUMA       -ARRYHTHMIAS       -LATE PRESENTING MYOCARDIAL INFARCTION       -COPD   CLINICAL FOLLOW-UP RECOMMENDED.   CBC     Status: Abnormal   Collection Time: 11/17/14  6:25 AM  Result Value Ref Range   WBC 11.3 (H) 4.0 - 10.5 K/uL   RBC 4.80 3.87 - 5.11 MIL/uL   Hemoglobin 14.2 12.0 - 15.0 g/dL   HCT 42.7 36.0 - 46.0 %   MCV 89.0 78.0 - 100.0 fL   MCH 29.6 26.0 - 34.0 pg   MCHC 33.3 30.0 - 36.0 g/dL   RDW 14.7 11.5 - 15.5 %   Platelets 153 150 - 400 K/uL  Comprehensive metabolic panel     Status: Abnormal   Collection Time: 11/17/14  6:25 AM  Result Value  Ref Range   Sodium 138 135 - 145 mmol/L   Potassium 3.7 3.5 - 5.1 mmol/L   Chloride 102 96 - 112 mmol/L   CO2 29 19 - 32 mmol/L   Glucose, Bld 78 70 - 99 mg/dL   BUN 23 6 - 23 mg/dL   Creatinine, Ser 1.25 (H) 0.50 - 1.10 mg/dL   Calcium 9.4 8.4 - 10.5 mg/dL   Total Protein 7.4 6.0 - 8.3 g/dL   Albumin 3.7 3.5 - 5.2 g/dL   AST 455 (H) 0 - 37 U/L   ALT 447 (H) 0 - 35 U/L   Alkaline Phosphatase 161 (H) 39 - 117 U/L    Total Bilirubin 0.9 0.3 - 1.2 mg/dL   GFR calc non Af Amer 43 (L) >90 mL/min   GFR calc Af Amer 50 (L) >90 mL/min    Comment: (NOTE) The eGFR has been calculated using the CKD EPI equation. This calculation has not been validated in all clinical situations. eGFR's persistently <90 mL/min signify possible Chronic Kidney Disease.    Anion gap 7 5 - 15  Lipase, blood     Status: Abnormal   Collection Time: 11/17/14  6:25 AM  Result Value Ref Range   Lipase 264 (H) 11 - 59 U/L  Troponin I (q 6hr x 3)     Status: Abnormal   Collection Time: 11/17/14  6:25 AM  Result Value Ref Range   Troponin I 0.08 (H) <0.031 ng/mL    Comment:        PERSISTENTLY INCREASED TROPONIN VALUES IN THE RANGE OF 0.04-0.49 ng/mL CAN BE SEEN IN:       -UNSTABLE ANGINA       -CONGESTIVE HEART FAILURE       -MYOCARDITIS       -CHEST TRAUMA       -ARRYHTHMIAS       -LATE PRESENTING MYOCARDIAL INFARCTION       -COPD   CLINICAL FOLLOW-UP RECOMMENDED.   Hepatitis panel, acute     Status: None   Collection Time: 11/17/14  4:43 PM  Result Value Ref Range   Hepatitis B Surface Ag NEGATIVE NEGATIVE   HCV Ab NEGATIVE NEGATIVE   Hep A IgM NON REACTIVE NON REACTIVE    Comment: (NOTE) Effective August 25, 2014, Hepatitis Acute Panel (test code [phone]) will be revised to automatically reflex to the Hepatitis C Viral RNA, Quantitative, Real-Time PCR assay if the Hepatitis C antibody screening result is Reactive. This action is being taken to ensure that the CDC/USPSTF recommended HCV diagnostic algorithm with the appropriate test reflex needed for accurate interpretation is followed.    Hep B C IgM NON REACTIVE NON REACTIVE    Comment: (NOTE) High levels of Hepatitis B Core IgM antibody are detectable during the acute stage of Hepatitis B. This antibody is used to differentiate current from past HBV infection. Performed at [facility]   CBC     Status: Abnormal   Collection Time: 11/18/14  4:56 AM   Result Value Ref Range   WBC 5.8 4.0 - 10.5 K/uL   RBC 3.71 (L) 3.87 - 5.11 MIL/uL   Hemoglobin 10.9 (L) 12.0 - 15.0 g/dL    Comment: REPEATED TO VERIFY SPECIMEN CHECKED FOR CLOTS    HCT 33.0 (L) 36.0 - 46.0 %   MCV 88.9 78.0 - 100.0 fL   MCH 29.4 26.0 - 34.0 pg   MCHC 33.0 30.0 - 36.0 g/dL   RDW 14.7 11.5 -  15.5 %   Platelets 131 (L) 150 - 400 K/uL  Comprehensive metabolic panel     Status: Abnormal   Collection Time: 11/18/14  4:56 AM  Result Value Ref Range   Sodium 139 135 - 145 mmol/L   Potassium 4.4 3.5 - 5.1 mmol/L   Chloride 108 96 - 112 mmol/L   CO2 23 19 - 32 mmol/L   Glucose, Bld 79 70 - 99 mg/dL   BUN 15 6 - 23 mg/dL   Creatinine, Ser 0.93 0.50 - 1.10 mg/dL   Calcium 9.0 8.4 - 10.5 mg/dL   Total Protein 5.7 (L) 6.0 - 8.3 g/dL   Albumin 3.0 (L) 3.5 - 5.2 g/dL   AST 149 (H) 0 - 37 U/L   ALT 247 (H) 0 - 35 U/L   Alkaline Phosphatase 121 (H) 39 - 117 U/L   Total Bilirubin 2.8 (H) 0.3 - 1.2 mg/dL   GFR calc non Af Amer 61 (L) >90 mL/min   GFR calc Af Amer 71 (L) >90 mL/min    Comment: (NOTE) The eGFR has been calculated using the CKD EPI equation. This calculation has not been validated in all clinical situations. eGFR's persistently <90 mL/min signify possible Chronic Kidney Disease.    Anion gap 8 5 - 15   *Note: Due to a large number of results and/or encounters for the requested time period, some results have not been displayed. A complete set of results can be found in Results Review.    Imaging / Studies: Dg Chest 2 View  11/16/2014   CLINICAL DATA:  Chest pain for 1 day  EXAM: CHEST  2 VIEW  COMPARISON:  CT 08/25/2014, chest radiograph 08/25/2014, chest CT dating back to 03/15/2012  FINDINGS: The heart size and mediastinal contours are within normal limits. Both lungs are clear with the exception of stable right upper lobe nodularity suggesting a benign process such as scarring given stability since prior exam dating back to at least 03/15/2012. Right  pleural thickening is stable. The visualized skeletal structures are unremarkable.  IMPRESSION: No active cardiopulmonary disease.   Electronically Signed   By: Conchita Paris [name]   On: 11/16/2014 15:48   Ct Abdomen Pelvis W Contrast  11/16/2014   CLINICAL DATA:  Initial evaluation for chest pressure, abnormal labs, personal history of rectal carcinoma  EXAM: CT ABDOMEN AND PELVIS WITH CONTRAST  TECHNIQUE: Multidetector CT imaging of the abdomen and pelvis was performed using the standard protocol following bolus administration of intravenous contrast.  CONTRAST:  46mL OMNIPAQUE IOHEXOL 300 MG/ML SOLN, 115mL OMNIPAQUE IOHEXOL 300 MG/ML SOLN  COMPARISON:  07/23/2014  FINDINGS: Small right pleural effusion with irregular pleural thickening at the lateral and posterior right lung base, stable from prior study. Mild bibasilar atelectasis. Heavy mitral annulus calcification stable.  Liver is normal. Questionable minimal gallbladder wall thickening and pericholecystic stranding. No biliary dilatation. Spleen and pancreas normal.  Adrenal glands normal.  Small renal cysts stable.  Calcification of the abdominal aorta. No significant mesenteric or retroperitoneal adenopathy.  Stomach and small bowel are normal. Appendix is normal. Postsurgical change at the rectosigmoid junction associated with presacral soft tissue thickening, stable.  Bladder is normal. Reproductive organs show no acute abnormalities. No acute musculoskeletal findings.  IMPRESSION: 1. Questionable minimal gallbladder wall thickening and minimal pericholecystic inflammation. Correlate clinically and consider right upper quadrant ultrasound if indicated. 2. Stable irregular right pleural thickening, bilateral renal cysts, and postsurgical change at the rectosigmoid junction with stable presacral soft tissue thickening.  Electronically Signed   By: Skipper Cliche [name]   On: 11/16/2014 20:46   US Abdomen Limited Ruq  11/17/2014   CLINICAL DATA:   Abnormal appearance of the gallbladder on recent CT. Chest pressure and elevated lipase level.  EXAM: US ABDOMEN LIMITED - RIGHT UPPER QUADRANT  COMPARISON:  11/16/2014  FINDINGS: Gallbladder:  Small amount of echogenic material in the gallbladder could represent sludge. There is no evidence for gallbladder wall thickening. No evidence for gallstones.  Common bile duct:  Diameter: 9 mm.  Liver:  No focal lesion identified. Within normal limits in parenchymal echogenicity.  IMPRESSION: Small amount of sludge in the gallbladder. No evidence for cholelithiasis.  Common bile duct is prominent and similar to the recent CT.   Electronically Signed   By: Markus Daft [name]   On: 11/17/2014 11:42    Medications / Allergies:  Scheduled Meds: . aspirin EC  81 mg Oral QHS  . enoxaparin (LOVENOX) injection  50 mg Subcutaneous Q24H  . Fluticasone Furoate-Vilanterol  1 puff Inhalation Q1200  . levofloxacin (LEVAQUIN) IV  750 mg Intravenous Q24H  . montelukast  10 mg Oral QHS  . pantoprazole  40 mg Oral QHS  . sodium chloride  3 mL Intravenous Q12H  . tiotropium  18 mcg Inhalation Daily   Continuous Infusions: . sodium chloride 100 mL/hr at 11/18/14 0140   PRN Meds:.HYDROmorphone (DILAUDID) injection, ipratropium-albuterol, ondansetron **OR** ondansetron (ZOFRAN) IV, traMADol  Antibiotics: Anti-infectives    Start     Dose/Rate Route Frequency Ordered Stop   11/17/14 1000  azithromycin (ZITHROMAX) tablet 250 mg  Status:  Discontinued     250 mg Oral Daily 11/16/14 2321 11/17/14 0726   11/17/14 0745  levofloxacin (LEVAQUIN) IVPB 750 mg     750 mg100 mL/hr over 90 Minutes Intravenous Every 24 hours 11/17/14 0730          Assessment/Plan Acute Pancreatitis - resolving Epigastric abdominal pain-resolved Gallbladder sludge without stones Transaminitis - ast/alt, alk phos improved, T bili up to 2.8 -Await HIDA scan -hepatitis panel--negative -further recommendations based on results Recent URI sx and  COPD exacerbation Hx dCHF, AVR/MVR  Erby Pian, ANP-BC [facility] Pager [phone](7A-4:30P) For consults and floor pages call [phone](7A-4:30P)  11/18/2014 8:02 AM

## 2014-11-19 ENCOUNTER — Encounter (HOSPITAL_COMMUNITY): Payer: Self-pay | Admitting: Physician Assistant

## 2014-11-19 ENCOUNTER — Telehealth: Payer: Self-pay | Admitting: Family Medicine

## 2014-11-19 DIAGNOSIS — R1013 Epigastric pain: Secondary | ICD-10-CM | POA: Insufficient documentation

## 2014-11-19 DIAGNOSIS — R932 Abnormal findings on diagnostic imaging of liver and biliary tract: Secondary | ICD-10-CM

## 2014-11-19 DIAGNOSIS — K859 Acute pancreatitis, unspecified: Principal | ICD-10-CM

## 2014-11-19 DIAGNOSIS — J449 Chronic obstructive pulmonary disease, unspecified: Secondary | ICD-10-CM

## 2014-11-19 DIAGNOSIS — C20 Malignant neoplasm of rectum: Secondary | ICD-10-CM

## 2014-11-19 LAB — CBC
HCT: 34.7 % — ABNORMAL LOW (ref 36.0–46.0)
Hemoglobin: 11.2 g/dL — ABNORMAL LOW (ref 12.0–15.0)
MCH: 28.8 pg (ref 26.0–34.0)
MCHC: 32.3 g/dL (ref 30.0–36.0)
MCV: 89.2 fL (ref 78.0–100.0)
Platelets: 145 10*3/uL — ABNORMAL LOW (ref 150–400)
RBC: 3.89 MIL/uL (ref 3.87–5.11)
RDW: 14.6 % (ref 11.5–15.5)
WBC: 5.2 10*3/uL (ref 4.0–10.5)

## 2014-11-19 LAB — BASIC METABOLIC PANEL
Anion gap: 10 (ref 5–15)
BUN: 15 mg/dL (ref 6–23)
CO2: 22 mmol/L (ref 19–32)
Calcium: 8.9 mg/dL (ref 8.4–10.5)
Chloride: 107 mmol/L (ref 96–112)
Creatinine, Ser: 0.97 mg/dL (ref 0.50–1.10)
GFR calc Af Amer: 68 mL/min — ABNORMAL LOW (ref >90)
GFR calc non Af Amer: 58 mL/min — ABNORMAL LOW (ref >90)
Glucose, Bld: 79 mg/dL (ref 70–99)
Potassium: 3.7 mmol/L (ref 3.5–5.1)
Sodium: 139 mmol/L (ref 135–145)

## 2014-11-19 LAB — HEPATIC FUNCTION PANEL
ALT: 173 U/L — ABNORMAL HIGH (ref 0–35)
AST: 61 U/L — ABNORMAL HIGH (ref 0–37)
Albumin: 3 g/dL — ABNORMAL LOW (ref 3.5–5.2)
Alkaline Phosphatase: 123 U/L — ABNORMAL HIGH (ref 39–117)
Bilirubin, Direct: 0.3 mg/dL (ref 0.0–0.5)
Indirect Bilirubin: 0.8 mg/dL (ref 0.3–0.9)
Total Bilirubin: 1.1 mg/dL (ref 0.3–1.2)
Total Protein: 6 g/dL (ref 6.0–8.3)

## 2014-11-19 MED ORDER — IBUPROFEN 600 MG PO TABS
600.0000 mg | ORAL_TABLET | Freq: Once | ORAL | Status: AC
  Administered 2014-11-19: 600 mg via ORAL
  Filled 2014-11-19: qty 1

## 2014-11-19 MED ORDER — FLUTICASONE PROPIONATE 50 MCG/ACT NA SUSP: 1.0000 | Freq: Every day | NASAL | Status: DC

## 2014-11-19 MED ORDER — FLUTICASONE PROPIONATE 50 MCG/ACT NA SUSP
1.0000 | Freq: Every day | NASAL | Status: DC
  Administered 2014-11-19: 1 via NASAL
  Filled 2014-11-19: qty 16

## 2014-11-19 NOTE — Discharge Summary (Signed)
Physician Discharge Summary  [name] WCB:[id number] DOB: 1946/07/18 DOA: 11/16/2014  PCP: Ria Bush, MD  Admit date: 11/16/2014 Discharge date: 11/19/2014  Time spent: 45 minutes  Recommendations for Outpatient Follow-up:  Patient will be discharged to home.  Patient to follow-up with her primary care physician within one week of discharge. She will also need follow-up with general surgery, Dr. Barry Dienes.  Patient to continue her medications as prescribed. Patient to follow a low-fat soft bland diet. Patient should have a repeat CBC and CMP within 1 week of discharge.  Discharge Diagnoses:  Acute pancreatitis Elevated troponins No acute kidney injury Acute bronchitis/history of COPD Rectal cancer survivor History of aortic and mitral valve replacement bioprosthetic  Discharge Condition: Stable  Diet recommendation: Low fat/soft bland diet  Filed Weights   11/17/14 0414 11/18/14 0507 11/19/14 0442  Weight: 98.022 kg (216 lb 1.6 oz) 98.2 kg (216 lb 7.9 oz) 97.478 kg (214 lb 14.4 oz)    History of present illness:  By Dr. Deedra Ehrich on 11/16/2014 [name] is a 69 y.o. female with a history of rectal cancer, aortic and mitral valve replacements, and COPD who presented to the [facility] ED on 11/16/2014 for evaluation of the acute onset of epigastric pain. The patient was in her baseline state of health until Friday. At that time, she had unexplained chills and sweats, but no significant pain. No documented fever. She was seen in her PCP's office and diagnosed with acute bronchitis/acute exacerbation of COPD. She has been taking azithromycin and a Medrol dose pack as prescribed. She stayed home from church today because of her bronchitis, and after visiting with a friend this afternoon, she experienced the sudden onset of 8 out of 10 pressure-like pain in her epigastric area that radiated to the middle of her back. Today, it has not been associated with nausea or  vomiting (though she had nausea on Friday). She tolerated breakfast today without difficulty, but has not really tried to eat since. She took two tramadol at home, which provided some relief. However, her husband urged her to present for further evaluation. She had similar symptoms in the past and was diagnosed with pericarditis at that time. Evaluation in the ED shows elevated LFTs (non-obstructive pattern) and a lipase of 1377. She is being admitted for management of acute pancreatitis.  CT showing mild inflammatory changes around the gallbladder. Further imaging recommended.  Hospital Course:  Acute pancreatitis -With abnormal gallbladder on CT abdomen and pelvis, this is most likely related to gallstone pancreatitis, but ultrasound showing sludge with no gallstones, lipase is back to normal. -Initially placed on levaquin, but discontinued 2/9 given no evidence of cholecystitis -LFTs improving, T bilirubin normal -Hepatitis panel negative -Gastroenterology and General surgery consulted and appreciated -HIDA scan: Abnormal study, gallbladder EF 0% to 45 minutes, EF of 33% at 45 minutes -Patient will need to followup with Dr. Barry Dienes for cholecystectomy.  Elevated troponins -Most likely related to pancreatitis, dehydration, mild renal failure. -Has trended downward to 0.08, no complaints of chest pain or shortness of breath at this time  Mild acute kidney injury -Resolved with IV fluids  Acute bronchitis/history of COPD -Appears to be improving, continue Flonase, Spiriva, Singulair  Rectal cancer survivor.  -CT changes stable.  History of Aortic and mitral valve replacements, bioprosthetic. -Continue aspirin 81 mg daily  Procedures: HIDA Scan Abd [place]  Consultations: General surgery Gastroenterology  Discharge Exam: Filed Vitals:   11/19/14 0442  BP: 127/62  Pulse: 74  Temp: 97.9 F (36.6 C)  Resp: 18     General: Well developed, well nourished, NAD, appears  stated age  HEENT: NCAT, mucous membranes moist.  Cardiovascular: S1 S2 auscultated, no rubs, Regular rate and rhythm.  Respiratory: Clear to auscultation bilaterally with equal chest rise  Abdomen: Soft, nontender, nondistended, + bowel sounds  Extremities: warm dry without cyanosis clubbing or edema  Neuro: AAOx3, nonfocal  Psych: Normal affect and demeanor with intact judgement and insight  Discharge Instructions      Discharge Instructions    Discharge instructions    Complete by:  As directed   Patient will be discharged to home.  Patient to follow-up with her primary care physician within one week of discharge. She will also need follow-up with general surgery, Dr. Barry Dienes.  Patient to continue her medications as prescribed. Patient to follow a low-fat soft bland diet. Patient should have a repeat CBC and CMP within 1 week of discharge.            Medication List    STOP taking these medications        azithromycin 250 MG tablet  Commonly known as:  ZITHROMAX Z-PAK     methylPREDNISolone 8 MG tablet  Commonly known as:  MEDROL      TAKE these medications        albuterol 108 (90 BASE) MCG/ACT inhaler  Commonly known as:  PROVENTIL HFA;VENTOLIN HFA  Inhale 2 puffs into the lungs every 6 (six) hours as needed for wheezing.     aspirin 81 MG tablet  Take 81 mg by mouth at bedtime.     BIOTIN PO  Take 1 tablet by mouth at bedtime.     BREO ELLIPTA 100-25 MCG/INH Aepb  Generic drug:  Fluticasone Furoate-Vilanterol  inhale 1 puff by mouth once daily     CALCIUM 600 + D PO  Take 1,200 mg by mouth at bedtime.     fluticasone 50 MCG/ACT nasal spray  Commonly known as:  FLONASE  Place 1 spray into both nostrils daily.     furosemide 40 MG tablet  Commonly known as:  LASIX  Take 1 tablet (40 mg total) by mouth daily.     ipratropium-albuterol 0.5-2.5 (3) MG/3ML Soln  Commonly known as:  DUONEB  Take 3 mLs by nebulization every 6 (six) hours as needed.       lovastatin 40 MG tablet  Commonly known as:  MEVACOR  take 1 tablet by mouth at bedtime     Miconazole Nitrate 2 % Powd  1 application by Does not apply route daily as needed (for skin). Apply to stomach and under breasts     montelukast 10 MG tablet  Commonly known as:  SINGULAIR  Take 1 tablet (10 mg total) by mouth at bedtime.     pantoprazole 40 MG tablet  Commonly known as:  PROTONIX  Take 1 tablet (40 mg total) by mouth at bedtime.     polyethylene glycol packet  Commonly known as:  MIRALAX / GLYCOLAX  Take 17 g by mouth daily.     potassium chloride 10 MEQ tablet  Commonly known as:  K-DUR,KLOR-CON  Take 2 tablets (20 mEq total) by mouth daily.     SPIRIVA HANDIHALER 18 MCG inhalation capsule  Generic drug:  tiotropium  inhale the contents of one capsule in the handihaler once daily     traMADol 50 MG tablet  Commonly known as:  ULTRAM  Take 50 mg by  mouth every 6 (six) hours as needed for moderate pain.     Vitamin D 2000 UNITS Caps  Take 1 capsule by mouth at bedtime.       Allergies  Allergen Reactions  . Codeine Nausea Only  . Prednisone Hives    In different places all over the body.  . Sulfonamide Derivatives Nausea Only   Follow-up Information    Follow up with [facility] In 1 week.   Specialty:  Cardiology   Contact information:   [address], [place] [phone]      Follow up with Ria Bush, MD. Schedule an appointment as soon as possible for a visit in 1 week.   Specialty:  Family Medicine   Why:  Hospital followup    Contact information:   [place] [address] [phone]       Follow up with [facility], MD. Schedule an appointment as soon as possible for a visit in 1 week.   Specialty:  General Surgery   Why:  Hospital followup   Contact information:   [address] [place] [address] [phone]        The  results of significant diagnostics from this hospitalization (including imaging, microbiology, ancillary and laboratory) are listed below for reference.    Significant Diagnostic Studies: Dg Chest 2 View  11/16/2014   CLINICAL DATA:  Chest pain for 1 day  EXAM: CHEST  2 VIEW  COMPARISON:  CT 08/25/2014, chest radiograph 08/25/2014, chest CT dating back to 03/15/2012  FINDINGS: The heart size and mediastinal contours are within normal limits. Both lungs are clear with the exception of stable right upper lobe nodularity suggesting a benign process such as scarring given stability since prior exam dating back to at least 03/15/2012. Right pleural thickening is stable. The visualized skeletal structures are unremarkable.  IMPRESSION: No active cardiopulmonary disease.   Electronically Signed   By: Conchita Paris [name]   On: 11/16/2014 15:48   Nm Hepatobiliary Including Gb  11/18/2014   CLINICAL DATA:  69 year old female with epigastric pain, nausea and elevated bili Rubin  EXAM: NUCLEAR MEDICINE HEPATOBILIARY IMAGING WITH GALLBLADDER EF  TECHNIQUE: Sequential images of the abdomen were obtained out to 60 minutes following intravenous administration of radiopharmaceutical. After slow intravenous infusion of 1.96 micrograms Cholecystokinin, gallbladder ejection fraction was determined.  RADIOPHARMACEUTICALS:  5 [employer] ZW-25E Choletec  COMPARISON:  Right upper quadrant ultrasound 11/17/2014; CT abdomen/ pelvis 11/16/2014  FINDINGS: Gallbladder ejection fraction measures 0%. At 45 min, normal ejection fraction is greater than 40%.  The patient did symptoms during CCK infusion.  IMPRESSION: 1. Abnormal study with gallbladder ejection fraction of 0% at 45 minutes. Expected normal ejection fraction at 45 minutes is at least 33%. 2. The patient did experience clinical symptoms during infusion of CCK suggesting the possibility of biliary sphincter dysfunction.   Electronically Signed   By: Jacqulynn Cadet [name]   On:  11/18/2014 15:33   Ct Abdomen Pelvis W Contrast  11/16/2014   CLINICAL DATA:  Initial evaluation for chest pressure, abnormal labs, personal history of rectal carcinoma  EXAM: CT ABDOMEN AND PELVIS WITH CONTRAST  TECHNIQUE: Multidetector CT imaging of the abdomen and pelvis was performed using the standard protocol following bolus administration of intravenous contrast.  CONTRAST:  50mL OMNIPAQUE IOHEXOL 300 MG/ML SOLN, 128mL OMNIPAQUE IOHEXOL 300 MG/ML SOLN  COMPARISON:  07/23/2014  FINDINGS: Small right pleural effusion with  irregular pleural thickening at the lateral and posterior right lung base, stable from prior study. Mild bibasilar atelectasis. Heavy mitral annulus calcification stable.  Liver is normal. Questionable minimal gallbladder wall thickening and pericholecystic stranding. No biliary dilatation. Spleen and pancreas normal.  Adrenal glands normal.  Small renal cysts stable.  Calcification of the abdominal aorta. No significant mesenteric or retroperitoneal adenopathy.  Stomach and small bowel are normal. Appendix is normal. Postsurgical change at the rectosigmoid junction associated with presacral soft tissue thickening, stable.  Bladder is normal. Reproductive organs show no acute abnormalities. No acute musculoskeletal findings.  IMPRESSION: 1. Questionable minimal gallbladder wall thickening and minimal pericholecystic inflammation. Correlate clinically and consider right upper quadrant ultrasound if indicated. 2. Stable irregular right pleural thickening, bilateral renal cysts, and postsurgical change at the rectosigmoid junction with stable presacral soft tissue thickening.   Electronically Signed   By: Skipper Cliche [name]   On: 11/16/2014 20:46   US Abdomen Limited Ruq  11/17/2014   CLINICAL DATA:  Abnormal appearance of the gallbladder on recent CT. Chest pressure and elevated lipase level.  EXAM: US ABDOMEN LIMITED - RIGHT UPPER QUADRANT  COMPARISON:  11/16/2014  FINDINGS: Gallbladder:   Small amount of echogenic material in the gallbladder could represent sludge. There is no evidence for gallbladder wall thickening. No evidence for gallstones.  Common bile duct:  Diameter: 9 mm.  Liver:  No focal lesion identified. Within normal limits in parenchymal echogenicity.  IMPRESSION: Small amount of sludge in the gallbladder. No evidence for cholelithiasis.  Common bile duct is prominent and similar to the recent CT.   Electronically Signed   By: Markus Daft [name]   On: 11/17/2014 11:42    Microbiology: No results found for this or any previous visit (from the past 240 hour(s)).   Labs: Basic Metabolic Panel:  Recent Labs Lab 11/16/14 1447 11/17/14 0625 11/18/14 0456 11/19/14 0445  NA 141 138 139 139  K 4.2 3.7 4.4 3.7  CL 103 102 108 107  CO2 30 29 23 22   GLUCOSE 142* 78 79 79  BUN 25* 23 15 15   CREATININE 1.29* 1.25* 0.93 0.97  CALCIUM 9.7 9.4 9.0 8.9   Liver Function Tests:  Recent Labs Lab 11/16/14 1607 11/17/14 0625 11/18/14 0456 11/19/14 0445  AST 465* 455* 149* 61*  ALT 208* 447* 247* 173*  ALKPHOS 133* 161* 121* 123*  BILITOT 1.0 0.9 2.8* 1.1  PROT 6.3 7.4 5.7* 6.0  ALBUMIN 3.3* 3.7 3.0* 3.0*    Recent Labs Lab 11/16/14 1607 11/17/14 0625  LIPASE 1377* 264*   No results for input(s): AMMONIA in the last 168 hours. CBC:  Recent Labs Lab 11/16/14 1447 11/17/14 0625 11/18/14 0456 11/19/14 0445  WBC 15.4* 11.3* 5.8 5.2  HGB 12.1 14.2 10.9* 11.2*  HCT 37.4 42.7 33.0* 34.7*  MCV 89.0 89.0 88.9 89.2  PLT 173 153 131* 145*   Cardiac Enzymes:  Recent Labs Lab 11/16/14 1651 11/17/14 0051 11/17/14 0625  TROPONINI 0.09* 0.09* 0.08*   BNP: BNP (last 3 results)  Recent Labs  11/16/14 1447  BNP 211.0*    ProBNP (last 3 results)  Recent Labs  11/28/13 0735 12/01/13 0625 08/25/14 1517  PROBNP 3226.0* 1068.0* 666.3*    CBG: No results for input(s): GLUCAP in the last 168 hours.     SignedCristal Ford  [facility] 11/19/2014, 10:21 AM

## 2014-11-19 NOTE — Telephone Encounter (Signed)
Pt admitted 2/7-2/10 for gallstone pancreatitis.  plz call for f/u phone call to ensure abd pain improved and to schedule hosp f/u visit within 2 weeks and will need labwork at that time.

## 2014-11-19 NOTE — Discharge Instructions (Signed)
Cholecystitis Cholecystitis is an inflammation of your gallbladder. It is usually caused by a buildup of gallstones or sludge (cholelithiasis) in your gallbladder. The gallbladder stores a fluid that helps digest fats (bile). Cholecystitis is serious and needs treatment right away.  CAUSES   Gallstones. Gallstones can block the tube that leads to your gallbladder, causing bile to build up. As bile builds up, the gallbladder becomes inflamed.  Bile duct problems, such as blockage from scarring or kinking.  Tumors. Tumors can stop bile from leaving your gallbladder correctly, causing bile to build up. As bile builds up, the gallbladder becomes inflamed. SYMPTOMS   Nausea.  Vomiting.  Abdominal pain, especially in the upper right area of your abdomen.  Abdominal tenderness or bloating.  Sweating.  Chills.  Fever.  Yellowing of the skin and the whites of the eyes (jaundice). DIAGNOSIS  Your caregiver may order blood tests to look for infection or gallbladder problems. Your caregiver may also order imaging tests, such as an ultrasound or computed tomography (CT) scan. Further tests may include a hepatobiliary iminodiacetic acid (HIDA) scan. This scan allows your caregiver to see your bile move from the liver to the gallbladder and to the small intestine. TREATMENT  A hospital stay is usually necessary to lessen the inflammation of your gallbladder. You may be required to not eat or drink (fast) for a certain amount of time. You may be given medicine to treat pain or an antibiotic medicine to treat an infection. Surgery may be needed to remove your gallbladder (cholecystectomy) once the inflammation has gone down. Surgery may be needed right away if you develop complications such as death of gallbladder tissue (gangrene) or a tear (perforation) of the gallbladder.  HOME CARE INSTRUCTIONS  Home care will depend on your treatment. In general:  If you were given antibiotics, take them as  directed. Finish them even if you start to feel better.  Only take over-the-counter or prescription medicines for pain, discomfort, or fever as directed by your caregiver.  Follow a low-fat diet until you see your caregiver again.  Keep all follow-up visits as directed by your caregiver. SEEK IMMEDIATE MEDICAL CARE IF:   Your pain is increasing and not controlled by medicines.  Your pain moves to another part of your abdomen or to your back.  You have a fever.  You have nausea and vomiting. MAKE SURE YOU:  Understand these instructions.  Will watch your condition.  Will get help right away if you are not doing well or get worse. Document Released: 09/26/2005 Document Revised: 12/19/2011 Document Reviewed: 08/12/2011 ExitCare Patient Information 2015 [employer]. This information is not intended to replace advice given to you by your health care provider. Make sure you discuss any questions you have with your health care provider.  

## 2014-11-19 NOTE — Telephone Encounter (Signed)
Message left for patient to return my call.  

## 2014-11-19 NOTE — Progress Notes (Signed)
Daily Rounding Note  11/19/2014, 8:42 AM  LOS: 2 days   SUBJECTIVE:       No further pain, no n/v after 2 soft diet meals.  Brown BMs.  No dyspnea.   OBJECTIVE:         Vital signs in last 24 hours:    Temp:  [97.6 F (36.4 C)-98.2 F (36.8 C)] 97.9 F (36.6 C) (02/10 0442) Pulse Rate:  [68-84] 74 (02/10 0442) Resp:  [18] 18 (02/10 0442) BP: (117-149)/(55-62) 127/62 mmHg (02/10 0442) SpO2:  [97 %-98 %] 98 % (02/10 0753) Weight:  [214 lb 14.4 oz (97.478 kg)] 214 lb 14.4 oz (97.478 kg) (02/10 0442) Last BM Date: 11/14/14 Filed Weights   11/17/14 0414 11/18/14 0507 11/19/14 0442  Weight: 216 lb 1.6 oz (98.022 kg) 216 lb 7.9 oz (98.2 kg) 214 lb 14.4 oz (97.478 kg)   General: obese, looks well and comfortable   Heart: RRR Chest: clear bil.  No dypnea or SOB Abdomen: soft, obese, active BS.  Not tender  Extremities: no CCE Neuro/Psych:  In excellent spirits.  No gross neurologic deficits.   Intake/Output from previous day: 02/09 0701 - 02/10 0700 In: 1240 [P.O.:840; I.V.:400] Out: 1701 [Urine:1700; Stool:1]  Intake/Output this shift: Total I/O In: 120 [P.O.:120] Out: -   Lab Results:  Recent Labs  11/17/14 0625 11/18/14 0456 11/19/14 0445  WBC 11.3* 5.8 5.2  HGB 14.2 10.9* 11.2*  HCT 42.7 33.0* 34.7*  PLT 153 131* 145*   BMET  Recent Labs  11/17/14 0625 11/18/14 0456 11/19/14 0445  NA 138 139 139  K 3.7 4.4 3.7  CL 102 108 107  CO2 29 23 22   GLUCOSE 78 79 79  BUN 23 15 15   CREATININE 1.25* 0.93 0.97  CALCIUM 9.4 9.0 8.9   LFT  Recent Labs  11/16/14 1607 11/17/14 0625 11/18/14 0456 11/19/14 0445  PROT 6.3 7.4 5.7* 6.0  ALBUMIN 3.3* 3.7 3.0* 3.0*  AST 465* 455* 149* 61*  ALT 208* 447* 247* 173*  ALKPHOS 133* 161* 121* 123*  BILITOT 1.0 0.9 2.8* 1.1  BILIDIR 0.4  --   --  0.3  IBILI 0.6  --   --  0.8   PT/INR No results for input(s): LABPROT, INR in the last 72 hours. Hepatitis  Panel  Recent Labs  11/17/14 1643  HEPBSAG NEGATIVE  HCVAB NEGATIVE  HEPAIGM NON REACTIVE  HEPBIGM NON REACTIVE    Studies/Results: Nm Hepatobiliary Including Gb  11/18/2014   CLINICAL DATA:  69 year old female with epigastric pain, nausea and elevated bili Rubin  EXAM: NUCLEAR MEDICINE HEPATOBILIARY IMAGING WITH GALLBLADDER EF  TECHNIQUE: Sequential images of the abdomen were obtained out to 60 minutes following intravenous administration of radiopharmaceutical. After slow intravenous infusion of 1.96 micrograms Cholecystokinin, gallbladder ejection fraction was determined.  RADIOPHARMACEUTICALS:  5 [employer] HQ-75F Choletec  COMPARISON:  Right upper quadrant ultrasound 11/17/2014; CT abdomen/ pelvis 11/16/2014  FINDINGS: Gallbladder ejection fraction measures 0%. At 45 min, normal ejection fraction is greater than 40%.  The patient did symptoms during CCK infusion.  IMPRESSION: 1. Abnormal study with gallbladder ejection fraction of 0% at 45 minutes. Expected normal ejection fraction at 45 minutes is at least 33%. 2. The patient did experience clinical symptoms during infusion of CCK suggesting the possibility of biliary sphincter dysfunction.   Electronically Signed   By: Jacqulynn Cadet [name]   On: 11/18/2014 15:33   US Abdomen Limited Ruq  11/17/2014  CLINICAL DATA:  Abnormal appearance of the gallbladder on recent CT. Chest pressure and elevated lipase level.  EXAM: US ABDOMEN LIMITED - RIGHT UPPER QUADRANT  COMPARISON:  11/16/2014  FINDINGS: Gallbladder:  Small amount of echogenic material in the gallbladder could represent sludge. There is no evidence for gallbladder wall thickening. No evidence for gallstones.  Common bile duct:  Diameter: 9 mm.  Liver:  No focal lesion identified. Within normal limits in parenchymal echogenicity.  IMPRESSION: Small amount of sludge in the gallbladder. No evidence for cholelithiasis.  Common bile duct is prominent and similar to the recent CT.    Electronically Signed   By: Markus Daft [name]   On: 11/17/2014 11:42    ASSESMENT:   *  Acute pancreatitis.  With sludge on GB ultrasound and 0% GB ejection fraction on HIDA, likely biliary.  Suspect she had biliary issues, possible pancreatitis during 08/2014 admission but no GI imaging or labs obtained.   Surgery raising question of sphincter of Odi dysfunction.  LFTs normalizing.  Clinically pt back to her asymptomatic baseline.     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 Hx adenomatous colon polyps. Recurrent non-malignant adenomatous polyps on colonoscopy 03/2013. Repeat colonoscopy due 03/2016. .   * S/p bioprosthetic MVR/AVR. On ASA but no blood thinners.     PLAN   *  Plan is to discharge, follow up labs at PMD office, follow with Dr Barry Dienes in next few weeks to arrange lap chole.     [name]  11/19/2014, 8:42 AM Pager: [phone] Attending MD note:   I have taken a history, and reviewed the chart. I agree with the Advanced Practitioner's impression and recommendations. Will sign off. Pt to follow up with Dr Ardis Hughs for rectal cancer surveillence.  [facility] Pager # [phone]

## 2014-11-19 NOTE — Progress Notes (Signed)
Patient ID: [name], female   DOB: 24-May-1946, 69 y.o.   MRN: [id number]     [facility]      [address] [place], [place] [address]    Phone: [phone] FAX: [phone]     Subjective: No tolerating.  Tolerating PO.    Objective:  Vital signs:  Filed Vitals:   11/18/14 1520 11/18/14 2001 11/19/14 0442 11/19/14 0753  BP: 149/62 117/55 127/62   Pulse: 84 68 74   Temp: 97.6 F (36.4 C) 98.2 F (36.8 C) 97.9 F (36.6 C)   TempSrc: Oral Oral Oral   Resp:  18 18   Height:      Weight:   214 lb 14.4 oz (97.478 kg)   SpO2: 98% 97% 97% 98%    Last BM Date: 11/14/14  Intake/Output   Yesterday:  02/09 0701 - 02/10 0700 In: 1240 [P.O.:840; I.V.:400] Out: 1701 [Urine:1700; Stool:1] This shift:     Physical Exam: General: Pt awake/alert/oriented x4 in no acute distress Abdomen: Soft. Nondistended. Non tender. No evidence of peritonitis. No incarcerated hernias.   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, ileostomy takedown 03/2012   Pancreatitis, acute   Acute bronchitis   Pancreatitis    Results:   Labs: Results for orders placed or performed during the hospital encounter of 11/16/14 (from the past 48 hour(s))  Hepatitis panel, acute     Status: None   Collection Time: 11/17/14  4:43 PM  Result Value Ref Range   Hepatitis B Surface Ag NEGATIVE NEGATIVE   HCV Ab NEGATIVE NEGATIVE   Hep A IgM NON REACTIVE NON REACTIVE    Comment: (NOTE) Effective August 25, 2014, Hepatitis Acute Panel (test code 22940) will be revised to automatically reflex to the Hepatitis C Viral RNA, Quantitative, Real-Time PCR assay if the Hepatitis C antibody screening result is Reactive. This action is being taken to ensure that the CDC/USPSTF recommended HCV diagnostic algorithm  with the appropriate test reflex needed for accurate interpretation is followed.    Hep B C IgM NON REACTIVE NON REACTIVE    Comment: (NOTE) High levels of Hepatitis B Core IgM antibody are detectable during the acute stage of Hepatitis B. This antibody is used to differentiate current from past HBV infection. Performed at [facility]   CBC     Status: Abnormal   Collection Time: 11/18/14  4:56 AM  Result Value Ref Range   WBC 5.8 4.0 - 10.5 K/uL   RBC 3.71 (L) 3.87 - 5.11 MIL/uL   Hemoglobin 10.9 (L) 12.0 - 15.0 g/dL    Comment: REPEATED TO VERIFY SPECIMEN CHECKED FOR CLOTS    HCT 33.0 (L) 36.0 - 46.0 %   MCV 88.9 78.0 - 100.0 fL   MCH 29.4 26.0 - 34.0 pg   MCHC 33.0 30.0 - 36.0 g/dL   RDW 14.7 11.5 - 15.5 %   Platelets 131 (L) 150 - 400 K/uL  Comprehensive metabolic panel     Status: Abnormal   Collection Time: 11/18/14  4:56 AM  Result Value Ref Range   Sodium 139 135 - 145 mmol/L   Potassium 4.4 3.5 - 5.1 mmol/L   Chloride 108 96 - 112 mmol/L   CO2 23 19 - 32 mmol/L   Glucose, Bld 79 70 - 99 mg/dL   BUN  15 6 - 23 mg/dL   Creatinine, Ser 0.93 0.50 - 1.10 mg/dL   Calcium 9.0 8.4 - 10.5 mg/dL   Total Protein 5.7 (L) 6.0 - 8.3 g/dL   Albumin 3.0 (L) 3.5 - 5.2 g/dL   AST 149 (H) 0 - 37 U/L   ALT 247 (H) 0 - 35 U/L   Alkaline Phosphatase 121 (H) 39 - 117 U/L   Total Bilirubin 2.8 (H) 0.3 - 1.2 mg/dL   GFR calc non Af Amer 61 (L) >90 mL/min   GFR calc Af Amer 71 (L) >90 mL/min    Comment: (NOTE) The eGFR has been calculated using the CKD EPI equation. This calculation has not been validated in all clinical situations. eGFR's persistently <90 mL/min signify possible Chronic Kidney Disease.    Anion gap 8 5 - 15  CBC     Status: Abnormal   Collection Time: 11/19/14  4:45 AM  Result Value Ref Range   WBC 5.2 4.0 - 10.5 K/uL   RBC 3.89 3.87 - 5.11 MIL/uL   Hemoglobin 11.2 (L) 12.0 - 15.0 g/dL   HCT 34.7 (L) 36.0 - 46.0 %   MCV 89.2 78.0 - 100.0 fL   MCH  28.8 26.0 - 34.0 pg   MCHC 32.3 30.0 - 36.0 g/dL   RDW 14.6 11.5 - 15.5 %   Platelets 145 (L) 150 - 400 K/uL  Basic metabolic panel     Status: Abnormal   Collection Time: 11/19/14  4:45 AM  Result Value Ref Range   Sodium 139 135 - 145 mmol/L   Potassium 3.7 3.5 - 5.1 mmol/L   Chloride 107 96 - 112 mmol/L   CO2 22 19 - 32 mmol/L   Glucose, Bld 79 70 - 99 mg/dL   BUN 15 6 - 23 mg/dL   Creatinine, Ser 0.97 0.50 - 1.10 mg/dL   Calcium 8.9 8.4 - 10.5 mg/dL   GFR calc non Af Amer 58 (L) >90 mL/min   GFR calc Af Amer 68 (L) >90 mL/min    Comment: (NOTE) The eGFR has been calculated using the CKD EPI equation. This calculation has not been validated in all clinical situations. eGFR's persistently <90 mL/min signify possible Chronic Kidney Disease.    Anion gap 10 5 - 15  Hepatic function panel     Status: Abnormal   Collection Time: 11/19/14  4:45 AM  Result Value Ref Range   Total Protein 6.0 6.0 - 8.3 g/dL   Albumin 3.0 (L) 3.5 - 5.2 g/dL   AST 61 (H) 0 - 37 U/L   ALT 173 (H) 0 - 35 U/L   Alkaline Phosphatase 123 (H) 39 - 117 U/L   Total Bilirubin 1.1 0.3 - 1.2 mg/dL   Bilirubin, Direct 0.3 0.0 - 0.5 mg/dL   Indirect Bilirubin 0.8 0.3 - 0.9 mg/dL   *Note: Due to a large number of results and/or encounters for the requested time period, some results have not been displayed. A complete set of results can be found in Results Review.    Imaging / Studies: Nm Hepatobiliary Including Gb  11/18/2014   CLINICAL DATA:  69 year old female with epigastric pain, nausea and elevated bili Rubin  EXAM: NUCLEAR MEDICINE HEPATOBILIARY IMAGING WITH GALLBLADDER EF  TECHNIQUE: Sequential images of the abdomen were obtained out to 60 minutes following intravenous administration of radiopharmaceutical. After slow intravenous infusion of 1.96 micrograms Cholecystokinin, gallbladder ejection fraction was determined.  RADIOPHARMACEUTICALS:  5 [employer] PZ-[id number] Choletec  COMPARISON:  Right upper quadrant  ultrasound 11/17/2014; CT abdomen/ pelvis 11/16/2014  FINDINGS: Gallbladder ejection fraction measures 0%. At 45 min, normal ejection fraction is greater than 40%.  The patient did symptoms during CCK infusion.  IMPRESSION: 1. Abnormal study with gallbladder ejection fraction of 0% at 45 minutes. Expected normal ejection fraction at 45 minutes is at least 33%. 2. The patient did experience clinical symptoms during infusion of CCK suggesting the possibility of biliary sphincter dysfunction.   Electronically Signed   By: Jacqulynn Cadet [name]   On: 11/18/2014 15:33   US Abdomen Limited Ruq  11/17/2014   CLINICAL DATA:  Abnormal appearance of the gallbladder on recent CT. Chest pressure and elevated lipase level.  EXAM: US ABDOMEN LIMITED - RIGHT UPPER QUADRANT  COMPARISON:  11/16/2014  FINDINGS: Gallbladder:  Small amount of echogenic material in the gallbladder could represent sludge. There is no evidence for gallbladder wall thickening. No evidence for gallstones.  Common bile duct:  Diameter: 9 mm.  Liver:  No focal lesion identified. Within normal limits in parenchymal echogenicity.  IMPRESSION: Small amount of sludge in the gallbladder. No evidence for cholelithiasis.  Common bile duct is prominent and similar to the recent CT.   Electronically Signed   By: Markus Daft [name]   On: 11/17/2014 11:42    Medications / Allergies:  Scheduled Meds: . aspirin EC  81 mg Oral QHS  . docusate sodium  200 mg Oral BID  . enoxaparin (LOVENOX) injection  50 mg Subcutaneous Q24H  . fluticasone  1 spray Each Nare Daily  . Fluticasone Furoate-Vilanterol  1 puff Inhalation Q1200  . ibuprofen  600 mg Oral Once  . montelukast  10 mg Oral QHS  . pantoprazole  40 mg Oral QHS  . polyethylene glycol  17 g Oral BID  . sodium chloride  3 mL Intravenous Q12H  . tiotropium  18 mcg Inhalation Daily   Continuous Infusions: . sodium chloride Stopped (11/18/14 1749)   PRN Meds:.HYDROmorphone (DILAUDID) injection,  ipratropium-albuterol, ondansetron **OR** ondansetron (ZOFRAN) IV, traMADol  Antibiotics: Anti-infectives    Start     Dose/Rate Route Frequency Ordered Stop   11/18/14 1200  levofloxacin (LEVAQUIN) tablet 750 mg  Status:  Discontinued     750 mg Oral Daily 11/18/14 0900 11/18/14 1521   11/17/14 1000  azithromycin (ZITHROMAX) tablet 250 mg  Status:  Discontinued     250 mg Oral Daily 11/16/14 2321 11/17/14 0726   11/17/14 0745  levofloxacin (LEVAQUIN) IVPB 750 mg  Status:  Discontinued     750 mg 100 mL/hr over 90 Minutes Intravenous Every 24 hours 11/17/14 0730 11/18/14 0900       Assessment/Plan Biliary dyskinesia-does not cause LFTs to increase and does not cause pancreatitis.  Follow up with Dr. Barry Dienes on outpatient basis consideration for cholecystectomy.  Her pain has resolved and she is tolerating PO.  Stable for discharge from surgical standpoint.   Pancreatitis-resolved.  Certainly could have passed a stone or could be medication induced.  Follow up as above.   Abnormal LFTs-trending down. T bili normal today.  See discussion above.     Erby Pian, [facility] Main Surgery Pager [phone]) For consults and floor pages call [phone]([id number])  11/19/2014 8:09 AM

## 2014-11-19 NOTE — Progress Notes (Signed)
Patient alert oriented , no c/.o pain, dizziness or shortness of breath. V/S stable, iv d/c, tele d/c. D/C instruction given patient verbalized understanding. Patient d/c per order,

## 2014-11-20 ENCOUNTER — Encounter (HOSPITAL_COMMUNITY): Payer: Self-pay

## 2014-11-20 ENCOUNTER — Ambulatory Visit: Payer: Medicare Other | Admitting: Physical Therapy

## 2014-11-20 NOTE — Telephone Encounter (Signed)
Spoke with patient. She is no longer in pain and seems to be getting along fine. She already had follow up scheduled for 11/24/14.

## 2014-11-24 ENCOUNTER — Encounter: Payer: Self-pay | Admitting: Family Medicine

## 2014-11-24 ENCOUNTER — Ambulatory Visit (INDEPENDENT_AMBULATORY_CARE_PROVIDER_SITE_OTHER): Payer: Medicare Other | Admitting: Family Medicine

## 2014-11-24 ENCOUNTER — Telehealth: Payer: Self-pay | Admitting: Family Medicine

## 2014-11-24 VITALS — BP 128/80 | HR 88 | Temp 98.0°F | Wt 210.2 lb

## 2014-11-24 DIAGNOSIS — K851 Biliary acute pancreatitis without necrosis or infection: Secondary | ICD-10-CM

## 2014-11-24 DIAGNOSIS — E785 Hyperlipidemia, unspecified: Secondary | ICD-10-CM

## 2014-11-24 DIAGNOSIS — J449 Chronic obstructive pulmonary disease, unspecified: Secondary | ICD-10-CM

## 2014-11-24 LAB — CBC WITH DIFFERENTIAL/PLATELET
Basophils Absolute: 0 10*3/uL (ref 0.0–0.1)
Basophils Relative: 0.5 % (ref 0.0–3.0)
Eosinophils Absolute: 0.1 10*3/uL (ref 0.0–0.7)
Eosinophils Relative: 1.4 % (ref 0.0–5.0)
HCT: 39.5 % (ref 36.0–46.0)
Hemoglobin: 13.1 g/dL (ref 12.0–15.0)
Lymphocytes Relative: 15.6 % (ref 12.0–46.0)
Lymphs Abs: 0.9 10*3/uL (ref 0.7–4.0)
MCHC: 33.1 g/dL (ref 30.0–36.0)
MCV: 86.8 fl (ref 78.0–100.0)
Monocytes Absolute: 0.7 10*3/uL (ref 0.1–1.0)
Monocytes Relative: 12 % (ref 3.0–12.0)
Neutro Abs: 4 10*3/uL (ref 1.4–7.7)
Neutrophils Relative %: 70.5 % (ref 43.0–77.0)
Platelets: 227 10*3/uL (ref 150.0–400.0)
RBC: 4.55 Mil/uL (ref 3.87–5.11)
RDW: 15.5 % (ref 11.5–15.5)
WBC: 5.6 10*3/uL (ref 4.0–10.5)

## 2014-11-24 LAB — COMPREHENSIVE METABOLIC PANEL
ALT: 42 U/L — ABNORMAL HIGH (ref 0–35)
AST: 22 U/L (ref 0–37)
Albumin: 3.7 g/dL (ref 3.5–5.2)
Alkaline Phosphatase: 115 U/L (ref 39–117)
BUN: 18 mg/dL (ref 6–23)
CO2: 32 mEq/L (ref 19–32)
Calcium: 9.8 mg/dL (ref 8.4–10.5)
Chloride: 101 mEq/L (ref 96–112)
Creatinine, Ser: 1.27 mg/dL — ABNORMAL HIGH (ref 0.40–1.20)
GFR: 44.29 mL/min — ABNORMAL LOW (ref >60.00)
Glucose, Bld: 92 mg/dL (ref 70–99)
Potassium: 4.1 mEq/L (ref 3.5–5.1)
Sodium: 138 mEq/L (ref 135–145)
Total Bilirubin: 0.6 mg/dL (ref 0.2–1.2)
Total Protein: 7 g/dL (ref 6.0–8.3)

## 2014-11-24 NOTE — Progress Notes (Signed)
Pre visit review using our clinic review tool, if applicable. No additional management support is needed unless otherwise documented below in the visit note. 

## 2014-11-24 NOTE — Assessment & Plan Note (Signed)
Not due yet for recheck - check at next visit and if LDL above goal will recommend change to more potent statin.

## 2014-11-24 NOTE — Patient Instructions (Addendum)
Trial off flonase - monitor congestion. Try changing spiriva to nightly. Continue current medicines. labwork today. Keep prior scheduled appointment.

## 2014-11-24 NOTE — Assessment & Plan Note (Signed)
Chronic coarse wheezing - without symptoms of chest pain, cough, dyspnea. Will just continue current regimen - recommended change spiriva to night.

## 2014-11-24 NOTE — Telephone Encounter (Signed)
Pt had to cancel her bone density appointment She would like to go to [facility] for this

## 2014-11-24 NOTE — Progress Notes (Signed)
BP 128/80 mmHg  Pulse 88  Temp(Src) 98 F (36.7 C) (Oral)  Wt 210 lb 4 oz (95.369 kg)   CC: hosp f/u visit  Subjective:    Patient ID: [name], female    DOB: [birth year], 69 y.o.   MRN: [id number]  HPI: [name] is a 69 y.o. female presenting on 11/24/2014 for Follow-up   Milderd presents today for hospital f/u visit. She was hospitalized with acute pancreatitis thought gallstone pancreatitis with ultrasound showing sludge, no stones and HIDA showing decreased EF. She was seen by gen surg and GI, and has f/u appt pending with Dr Barry Dienes (12/08/2014). Planned outpatient cholecystectomy. Dx with biliary diskinesia.   Discharge AST 61, ALT 173, Alb 3.0, ALP 173, Cr 0.97, and Hgb 11.2. TBili  2.8-->1.1.   Elevated troponins attributed to dehydration and pancreatitis. She has follow up with [facility] cardiology this week as well. She usually sees Dr Sallyanne Kuster.   She was seen prior at [facility] with dx acute bronchitis and COPD exacerbation, treated with duoneb, medrol dose pack and zpack.   This pain was same pain she had that led to hospitalization 08/2014, unrevealing cardiac evaluation.   Denies cough, dyspnea, chest pain/tightness, abd pain, nausea/vomiting or diarrhea. No fevers.   Admit date: 11/16/2014 Discharge date: 11/19/2014 Follow up phone call: 11/20/2014  Recommendations for Outpatient Follow-up:  Patient will be discharged to home. Patient to follow-up with her primary care physician within one week of discharge. She will also need follow-up with general surgery, Dr. Barry Dienes. Patient to continue her medications as prescribed. Patient to follow a low-fat soft bland diet. Patient should have a repeat CBC and CMP within 1 week of discharge.  Discharge Diagnoses:  Acute pancreatitis Elevated troponins No acute kidney injury Acute bronchitis/history of COPD Rectal cancer survivor History of aortic and mitral valve replacement bioprosthetic  Relevant past  medical, surgical, family and social history reviewed and updated as indicated. Interim medical history since our last visit reviewed. Allergies and medications reviewed and updated. Current Outpatient Prescriptions on File Prior to Visit  Medication Sig  . albuterol (PROVENTIL HFA;VENTOLIN HFA) 108 (90 BASE) MCG/ACT inhaler Inhale 2 puffs into the lungs every 6 (six) hours as needed for wheezing.  Marland Kitchen aspirin 81 MG tablet Take 81 mg by mouth at bedtime.   Marland Kitchen BIOTIN PO Take 1 tablet by mouth at bedtime.   Marland Kitchen BREO ELLIPTA 100-25 MCG/INH AEPB inhale 1 puff by mouth once daily  . Calcium Carbonate-Vitamin D (CALCIUM 600 + D PO) Take 1,200 mg by mouth at bedtime.   . Cholecalciferol (VITAMIN D) 2000 UNITS CAPS Take 1 capsule by mouth at bedtime.   . fluticasone (FLONASE) 50 MCG/ACT nasal spray Place 1 spray into both nostrils daily.  . furosemide (LASIX) 40 MG tablet Take 1 tablet (40 mg total) by mouth daily.  Marland Kitchen ipratropium-albuterol (DUONEB) 0.5-2.5 (3) MG/3ML SOLN Take 3 mLs by nebulization every 6 (six) hours as needed.  . lovastatin (MEVACOR) 40 MG tablet take 1 tablet by mouth at bedtime  . Miconazole Nitrate 2 % POWD 1 application by Does not apply route daily as needed (for skin). Apply to stomach and under breasts  . montelukast (SINGULAIR) 10 MG tablet Take 1 tablet (10 mg total) by mouth at bedtime.  . pantoprazole (PROTONIX) 40 MG tablet Take 1 tablet (40 mg total) by mouth at bedtime.  . polyethylene glycol (MIRALAX / GLYCOLAX) packet Take 17 g by mouth daily.  . potassium chloride (  K-DUR,KLOR-CON) 10 MEQ tablet Take 2 tablets (20 mEq total) by mouth daily.  Marland Kitchen SPIRIVA HANDIHALER 18 MCG inhalation capsule inhale the contents of one capsule in the handihaler once daily (Patient taking differently: inhale the contents of one capsule in the handihaler once nightly)  . traMADol (ULTRAM) 50 MG tablet Take 50 mg by mouth every 6 (six) hours as needed for moderate pain.   No current  facility-administered medications on file prior to visit.    Review of Systems Per HPI unless specifically indicated above     Objective:    BP 128/80 mmHg  Pulse 88  Temp(Src) 98 F (36.7 C) (Oral)  Wt 210 lb 4 oz (95.369 kg)  Wt Readings from Last 3 Encounters:  11/24/14 210 lb 4 oz (95.369 kg)  11/19/14 214 lb 14.4 oz (97.478 kg)  11/14/14 216 lb 8 oz (98.204 kg)    Physical Exam  Constitutional: She appears well-developed and well-nourished. No distress.  HENT:  Mouth/Throat: Oropharynx is clear and moist. No oropharyngeal exudate.  Neck: Normal range of motion. Neck supple.  Cardiovascular: Normal rate, regular rhythm and intact distal pulses.   Murmur (mid systolic murmur best at LUSB) heard. Pulmonary/Chest: Effort normal. No respiratory distress. She has wheezes (coarse). She has no rales.  Musculoskeletal: She exhibits no edema.  Skin: Skin is warm and dry. No rash noted.  Psychiatric: She has a normal mood and affect.  Nursing note and vitals reviewed.      Assessment & Plan:   Problem List Items Addressed This Visit    Pancreatitis, acute - Primary    Recent acute pancreatitis with lipase to 1377. Recovering well.  Presumed related to biliary dyskinesia given abnormal HIDA, less likely azithromycin related. Regardless, overall improved - will recheck CMP and CBC today. Has f/u with Dr Barry Dienes at end of month.       Relevant Orders   Comprehensive metabolic panel   CBC with Differential/Platelet   HLD (hyperlipidemia)    Not due yet for recheck - check at next visit and if LDL above goal will recommend change to more potent statin.      COPD (chronic obstructive pulmonary disease)    Chronic coarse wheezing - without symptoms of chest pain, cough, dyspnea. Will just continue current regimen - recommended change spiriva to night.          Follow up plan: Return in about 3 months (around 02/22/2015), or as needed.

## 2014-11-24 NOTE — Assessment & Plan Note (Signed)
Recent acute pancreatitis with lipase to 1377. Recovering well.  Presumed related to biliary dyskinesia given abnormal HIDA, less likely azithromycin related. Regardless, overall improved - will recheck CMP and CBC today. Has f/u with Dr Barry Dienes at end of month.

## 2014-11-25 ENCOUNTER — Encounter: Payer: Self-pay | Admitting: Physical Therapy

## 2014-11-25 ENCOUNTER — Encounter (HOSPITAL_COMMUNITY)
Admission: RE | Admit: 2014-11-25 | Discharge: 2014-11-25 | Disposition: A | Discharge status: Home / Self Care | Payer: Self-pay | Source: Ambulatory Visit | Attending: Internal Medicine | Admitting: Internal Medicine

## 2014-11-25 ENCOUNTER — Telehealth: Payer: Self-pay

## 2014-11-25 ENCOUNTER — Ambulatory Visit: Payer: Medicare Other | Attending: General Surgery | Admitting: Physical Therapy

## 2014-11-25 DIAGNOSIS — N8189 Other female genital prolapse: Secondary | ICD-10-CM | POA: Diagnosis not present

## 2014-11-25 DIAGNOSIS — C20 Malignant neoplasm of rectum: Secondary | ICD-10-CM

## 2014-11-25 NOTE — Therapy (Signed)
[facility] [address] [place], [place], [place], [address] Phone: [phone]   Fax:  [phone]  Physical Therapy Treatment  Patient Details  Name: [name] MRN: [id number] Date of Birth: 1946/01/17 Referring Provider:  Ria Bush, MD  Encounter Date: 11/25/2014      PT End of Session - 11/25/14 1315    Visit Number 8   Number of Visits 16   Date for PT Re-Evaluation 01/01/15   PT Start Time 1230   PT Stop Time 0115   PT Time Calculation (min) 765 min   Activity Tolerance Patient tolerated treatment well   Behavior During Therapy [facility] for tasks assessed/performed      Past Medical History  Diagnosis Date  . IBS (irritable bowel syndrome)   . COPD (chronic obstructive pulmonary disease)   . Asthma   . Lung nodule     RLL nodule-70mm stable 2006, April 2009, and June 2009  . Anemia   . Hyperlipemia   . Obesity   . Essential hypertension   . Sleep apnea     PT TOLD BORDERLINE-TRIED CPAP-DID NOT HELP-SHE DOES NOT USE CPAP  . Arthritis   . History of radiation therapy 05/30/11 to 07/07/11    rectum  . History of pneumonia     several times  . History of shingles   . Rectal cancer 04/2011    T3N0; s/p lap LAR, s/p ileostomy, s/p reversal, s/p chemo  . Severe aortic valve stenosis  AVR 3/15 10/01/2013    mild-mod by echo 02/2014  . Rheumatic heart disease mitral stenosis     mod MS by echo 02/2014  . Anxiety   . S/P AVR (aortic valve replacement) 2015    bioprosthetic (Bartle)  . S/P MVR (mitral valve replacement) 2015    bioprosthetic (Bartle)  . DDD (degenerative disc disease)     cervical - kyphosis with mod DD changes C5/6 and C6/7; lumbar - early DD at L2/3 (Elsner)  . Chronic diastolic heart failure 28/78/6767  . Small bowel obstruction, partial 08/2013    reolved without NGT placement.   . Pancreatitis 11/2014    ?zpack related vs gallstone pancreatitis with abnormal HIDA scan pending  cholecystectomy    Past Surgical History  Procedure Laterality Date  . Foot surgery  left foot    hammer toe  . Hand surgery  Bil    carpal tunnel  . Back surgery  2006, 2007    2006 SPACER, 2007 decompression and fusion L4/5  . Tonsillectomy    . Colon resection  08/25/2011    Procedure: COLON RESECTION LAPAROSCOPIC;  Surgeon: Stark Klein, MD;  Location: [facility];  Service: General;  Laterality: N/A;  Laparoscopic Assisted Low Anterior Resection Diverting Ostomy and onQ pain pump  . Ileostomy  08/25/2011  . Ileostomy closure  04/02/2012    Procedure: ILEOSTOMY TAKEDOWN;  Surgeon: Stark Klein, MD;  Location: [facility];  Service: General;  Laterality: N/A;  . Bowel resection  04/02/2012    Procedure: SMALL BOWEL RESECTION;  Surgeon: Stark Klein, MD;  Location: [facility];  Service: General;  Laterality: N/A;  . Carpal tunnel release    . Cardiac catheterization  11/27/2013  . Tee without cardioversion N/A 11/29/2013    Procedure: TRANSESOPHAGEAL ECHOCARDIOGRAM (TEE);  Surgeon: Sueanne Margarita, MD;  Location: [facility];  Service: Cardiovascular;  Laterality: N/A;  . Aortic valve replacement N/A 12/12/2013    Procedure: AORTIC VALVE REPLACEMENT (AVR);  Surgeon: Gaye Pollack,  MD; Service: Open Heart Surgery  . Mitral valve replacement N/A 12/12/2013    Procedure: MITRAL VALVE (MV) REPLACEMENT OR REPAIR;  Surgeon: Gaye Pollack, MD; Service: Open Heart Surgery  . Intraoperative transesophageal echocardiogram N/A 12/12/2013    Procedure: INTRAOPERATIVE TRANSESOPHAGEAL ECHOCARDIOGRAM;  Surgeon: Gaye Pollack, MD;  Location: [facility] OR;  Service: Open Heart Surgery;  Laterality: N/A;  . Colonoscopy  03/2013    1 polyp, rpt 3 yrs Ardis Hughs)  . Left and right heart catheterization with coronary angiogram N/A 11/27/2013    Procedure: LEFT AND RIGHT HEART CATHETERIZATION WITH CORONARY ANGIOGRAM;  Surgeon: Sanda Klein, MD;  Location: [facility];  Service: Cardiovascular;  Laterality: N/A;    There were no  vitals taken for this visit.  Visit Diagnosis:  Rectal cancer      Subjective Assessment - 11/25/14 1229    Symptoms Urinary leakage, uncontrollable gas, trouble fully emptying her bowels   Pertinent History Patient was in the hospital last week , 2/7-2/10, due to gallbladder   Patient Stated Goals reduce urinary leakage and uncontrallable gas   Currently in Pain? No/denies          [facility] PT Assessment - 11/25/14 0001    Flexibility   Soft Tissue Assessment /Muscle Lenght yes   Levator Ani increased tension and tenderness   Obturator Internus increased tension and tender                  [facility] Adult PT Treatment/Exercise - 11/25/14 0001    Manual Therapy   Manual Therapy Internal Pelvic Floor   Myofascial Release diaphragm release   Internal Pelvic Floor Patient confirmed identification and approved internal treatment through the vagina. soft tissue work to bilateral levator ani, obturator internist, myofascial release of urethra and rectum                PT Education - 11/25/14 1313    Education provided Yes   Education Details contraction of pelvic floor with standing   Person(s) Educated Patient   Methods Explanation;Demonstration;Tactile cues;Verbal cues   Comprehension Verbalized understanding          PT Short Term Goals - 11/25/14 1319    PT SHORT TERM GOAL #1   Title be independent with initial HEP for flexibility exercises   Time 8   Period Weeks   Status Achieved   PT SHORT TERM GOAL #2   Title be independent with initial HEP for abdominal contraction    Time 8   Period Weeks   Status Achieved   PT SHORT TERM GOAL #3   Title be independent with abdominal massage   Time 8   Period Weeks   Status Achieved   PT SHORT TERM GOAL #4   Title understand correct toileting techniques to fully evacuate bowels   Time 8   Period Weeks   Status Achieved           PT Long Term Goals - 11/25/14 1321    PT LONG TERM GOAL #1   Title  demonstrate and/or verbalize techniques to reduce the risk of re-injury to include info on posture, body mechanics   Time 8   Period Weeks   Status Achieved   PT LONG TERM GOAL #2   Title be independent with advanced HEP for pelvic floor contraction    Time 8   Period Weeks   Status New   PT LONG TERM GOAL #3   Title urinary leakage decreased >/= 50%  Time 8   Period Weeks   Status New   PT LONG TERM GOAL #4   Title control gas >/= 70% of time due to incresaed pelvic floor strength   Time 8   Period Weeks   Status New   PT LONG TERM GOAL #5   Title ability to fully empty her bowels 75% of the time   Time 8   Period Weeks   Status New               Plan - 11/25/14 1316    Clinical Impression Statement Patient has tightness located in bilateral levator ani, obturator internist, and diaphragm.  Weakness in pelvic floor causing urinary leakage   Pt will benefit from skilled therapeutic intervention in order to improve on the following deficits Increased fascial restricitons;Decreased strength   Rehab Potential Good   Clinical Impairments Affecting Rehab Potential None   PT Frequency 1x / week   PT Duration 8 weeks   PT Treatment/Interventions Neuromuscular re-education;Manual techniques;Therapeutic exercise;Patient/family education;Therapeutic activities   PT Next Visit Plan Work on decreasing gluteal tone in standing, soft tissue work internally, pelvic floor strengthening; diaphragm   PT Home Exercise Plan continue with pelvic floor strengthening   Consulted and Agree with Plan of Care Patient        Problem List Patient Active Problem List   Diagnosis Date Noted  . Abnormal CT scan, gallbladder   . Epigastric pain   . Acute bronchitis 11/17/2014  . Pancreatitis, acute 11/16/2014  . Medicare annual wellness visit, initial 10/15/2014  . Health maintenance examination 10/15/2014  . Grieving 10/15/2014  . Chronic diastolic heart failure 51/76/1607  . Chest  pain 08/25/2014  . Oral thrush 02/12/2014  . 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 Pulmonary nodule 02/23/2012  . History of radiation therapy   . Rectal cancer, midrectum ypT3ypN0, s/p neoadj chemoXRT, lap LAR with diverting loop ileostomy 08/2011, ileostomy takedown 03/2012 05/26/2011  . Essential hypertension, benign 03/24/2009  . Morbid obesity 03/05/2008  . BACK PAIN 03/05/2008  . MERALGIA PARESTHETICA 12/13/2007  . HLD (hyperlipidemia) 05/03/2007  . Asthma 05/03/2007  . COPD (chronic obstructive pulmonary disease) 05/03/2007    Freada Twersky, PT 11/25/2014, 1:25 PM  [facility] [address] [place], [place], [place], [address] Phone: [phone]   Fax:  [phone]

## 2014-11-25 NOTE — Patient Instructions (Signed)
Physical therapist instructed patient on contraction of the pelvic floor with correct breathing.  Physical therapist used palpation on the lower abdominal to enhance the contraction.  Physical therapist instructed patient on how to contract the pelvic floor while in standing to relax the pelvic floor. Contract 5 seconds while relaxing the the gluteals.  Patient able to return demonstration correctly.

## 2014-11-25 NOTE — Telephone Encounter (Signed)
Pt left v/m requesting cb when has 11/24/14 lab results.

## 2014-11-25 NOTE — Telephone Encounter (Signed)
See results note. Released via mychart plz notify patient.

## 2014-11-26 ENCOUNTER — Ambulatory Visit (INDEPENDENT_AMBULATORY_CARE_PROVIDER_SITE_OTHER): Payer: Medicare Other | Admitting: Physician Assistant

## 2014-11-26 ENCOUNTER — Encounter: Payer: Self-pay | Admitting: Physician Assistant

## 2014-11-26 VITALS — BP 100/70 | HR 86 | Ht 66.0 in | Wt 208.0 lb

## 2014-11-26 DIAGNOSIS — Z954 Presence of other heart-valve replacement: Secondary | ICD-10-CM

## 2014-11-26 DIAGNOSIS — Z8719 Personal history of other diseases of the digestive system: Secondary | ICD-10-CM

## 2014-11-26 DIAGNOSIS — Z952 Presence of prosthetic heart valve: Secondary | ICD-10-CM

## 2014-11-26 DIAGNOSIS — R778 Other specified abnormalities of plasma proteins: Secondary | ICD-10-CM

## 2014-11-26 DIAGNOSIS — J449 Chronic obstructive pulmonary disease, unspecified: Secondary | ICD-10-CM

## 2014-11-26 DIAGNOSIS — I5032 Chronic diastolic (congestive) heart failure: Secondary | ICD-10-CM

## 2014-11-26 DIAGNOSIS — R7989 Other specified abnormal findings of blood chemistry: Secondary | ICD-10-CM

## 2014-11-26 DIAGNOSIS — I10 Essential (primary) hypertension: Secondary | ICD-10-CM

## 2014-11-26 NOTE — Telephone Encounter (Signed)
Message left for patient to return my call.  

## 2014-11-26 NOTE — Telephone Encounter (Signed)
Pt left v/m requesting cb [phone].

## 2014-11-26 NOTE — Telephone Encounter (Signed)
Spoke with patient.

## 2014-11-26 NOTE — Progress Notes (Signed)
Cardiology Office Note   Date:  11/26/2014   ID:  [name], [name] 1946-04-14, MRN [id number]  PCP:  Ria Bush, MD  Cardiologist:  Dr. Sanda Klein     Chief Complaint  Patient presents with  . Hospitalization Follow-up    s/p Pancreatits; Elevated Troponin  . Cardiac Valve Problem    s/p AVR/MVR     History of Present Illness: [name] is a 69 y.o. female with a hx of COPD, HTN, HLD, anemia, prior rectal cancer, and rhematic heart disease s/p bioprosthetic AVR/MVR (12/12/2013 Bartle).  Last seen by Dr. Dani Gobble Croitoru in 12/15 after an admission for chest pain.  He noted that there may have been some degree of patient valve mismatch that could contribute to her dyspnea. Echocardiogram in August 2015 showed favorable gradients. However, the echocardiogram in November 2015 demonstrated a mean mitral gradient of 11 mmHg. He felt that it was important to avoid tachycardia since the mitral prosthesis is intrinsically stenotic.  Admitted 2/7-2/10 with acute pancreatitis felt to likely be related to gallstone pancreatitis.  She was seen by gastroenterology and general surgery. Plan was for outpatient follow-up with general surgery for possible cholecystectomy in the future.  She had mildly elevated troponins were likely felt to be related to demand ischemia. She was not seen by cardiology.  She had mildly elevated creatinine felt to be related to dehydration from pancreatitis. This improved with IV fluid hydration.   Recent Labs  11/16/14 1500 11/16/14 1651 11/17/14 0051 11/17/14 0625  TROPONINI  --  0.09* 0.09* 0.08*  TROPIPOC 0.04  --   --   --      Since discharge, she has been doing well. She denies chest discomfort. She denies significant changes in her baseline dyspnea. She denies orthopnea, PND or edema. She denies syncope or dizziness.   Studies/Reports Reviewed Today:  Event Monitor 05/2014:  Brief PAT, no AFib  Cardiopulmonary stress test  06/2014 Conclusion: Exercise testing with gas exchange demonstrates a low normal functional capacity when compared to matched sedentary norms. The significant improvement in the relative VO2 when adjusted for ideal body may reflect the patient's overall functional limitation being related to her body habitus. There does not appear to be a significant circulatory or ventilatory limitation to the exercise. Deconditioning likely playing a role in her symptoms.   Echocardiogram 08/26/14 - Mild LVH. EF 60% to 65%. Wall motion was normal. - Aortic valve: Poorly visualized. A bioprosthesis was present. - Mitral valve: There is a MV prosthesis in place which is poorlyvisualized. There is heavy calcification of the MV annulus and some degree of calcification and thickening of the valve leaflets but cannot visualize adequately to comment any further. The mean MV gradient is 36mmHg consistent with severe mitral stenosis but the pressure halftime is normal Recommend TEE for further evaluation if clinically indicated. Poorly visualized. A bioprosthesis was present. - Pulmonic valve: There was trivial regurgitation.  Cardiac Cath 11/2013 LAD: Luminal irregularities LCx:  Luminal irregularities, distal 30% RCA: Luminal irregularities EF: >70%    Past Medical History  Diagnosis Date  . IBS (irritable bowel syndrome)   . COPD (chronic obstructive pulmonary disease)   . Asthma   . Lung nodule     RLL nodule-34mm stable 2006, April 2009, and June 2009  . Anemia   . Hyperlipemia   . Obesity   . Essential hypertension   . Sleep apnea     PT TOLD BORDERLINE-TRIED CPAP-DID NOT HELP-SHE DOES NOT  USE CPAP  . Arthritis   . History of radiation therapy 05/30/11 to 07/07/11    rectum  . History of pneumonia     several times  . History of shingles   . Rectal cancer 04/2011    T3N0; s/p lap LAR, s/p ileostomy, s/p reversal, s/p chemo  . Severe aortic valve stenosis  AVR 3/15 10/01/2013    mild-mod by echo  02/2014  . Rheumatic heart disease mitral stenosis     mod MS by echo 02/2014  . Anxiety   . S/P AVR (aortic valve replacement) 2015    bioprosthetic (Bartle)  . S/P MVR (mitral valve replacement) 2015    bioprosthetic (Bartle)  . DDD (degenerative disc disease)     cervical - kyphosis with mod DD changes C5/6 and C6/7; lumbar - early DD at L2/3 (Elsner)  . Chronic diastolic heart failure 49/20/1007  . Small bowel obstruction, partial 08/2013    reolved without NGT placement.   . Pancreatitis 11/2014    ?zpack related vs gallstone pancreatitis with abnormal HIDA scan pending cholecystectomy    Past Surgical History  Procedure Laterality Date  . Foot surgery  left foot    hammer toe  . Hand surgery  Bil    carpal tunnel  . Back surgery  2006, 2007    2006 SPACER, 2007 decompression and fusion L4/5  . Tonsillectomy    . Colon resection  08/25/2011    Procedure: COLON RESECTION LAPAROSCOPIC;  Surgeon: Stark Klein, MD;  Location: [facility];  Service: General;  Laterality: N/A;  Laparoscopic Assisted Low Anterior Resection Diverting Ostomy and onQ pain pump  . Ileostomy  08/25/2011  . Ileostomy closure  04/02/2012    Procedure: ILEOSTOMY TAKEDOWN;  Surgeon: Stark Klein, MD;  Location: [facility];  Service: General;  Laterality: N/A;  . Bowel resection  04/02/2012    Procedure: SMALL BOWEL RESECTION;  Surgeon: Stark Klein, MD;  [facility]: [facility];  Service: General;  Laterality: N/A;  . Carpal tunnel release    . Cardiac catheterization  11/27/2013  . Tee without cardioversion N/A 11/29/2013    Procedure: TRANSESOPHAGEAL ECHOCARDIOGRAM (TEE);  Surgeon: Sueanne Margarita, MD;  Location: [facility];  Service: Cardiovascular;  Laterality: N/A;  . Aortic valve replacement N/A 12/12/2013    Procedure: AORTIC VALVE REPLACEMENT (AVR);  Surgeon: Gaye Pollack, MD; Service: Open Heart Surgery  . Mitral valve replacement N/A 12/12/2013    Procedure: MITRAL VALVE (MV) REPLACEMENT OR REPAIR;  Surgeon: Gaye Pollack, MD; Service: Open Heart Surgery  . Intraoperative transesophageal echocardiogram N/A 12/12/2013    Procedure: INTRAOPERATIVE TRANSESOPHAGEAL ECHOCARDIOGRAM;  Surgeon: Gaye Pollack, MD;  Location: [facility];  Service: Open Heart Surgery;  Laterality: N/A;  . Colonoscopy  03/2013    1 polyp, rpt 3 yrs Ardis Hughs)  . Left and right heart catheterization with coronary angiogram N/A 11/27/2013    Procedure: LEFT AND RIGHT HEART CATHETERIZATION WITH CORONARY ANGIOGRAM;  Surgeon: Sanda Klein, MD;  Location: [facility];  Service: Cardiovascular;  Laterality: N/A;     Current Outpatient Prescriptions  Medication Sig Dispense Refill  . albuterol (PROVENTIL HFA;VENTOLIN HFA) 108 (90 BASE) MCG/ACT inhaler Inhale 2 puffs into the lungs every 6 (six) hours as needed for wheezing.    Marland Kitchen aspirin 81 MG tablet Take 81 mg by mouth at bedtime.     Marland Kitchen BIOTIN PO Take 1 tablet by mouth at bedtime.     Marland Kitchen BREO ELLIPTA 100-25 MCG/INH AEPB inhale 1  puff by mouth once daily 60 each 5  . Calcium Carbonate-Vitamin D (CALCIUM 600 + D PO) Take 1,200 mg by mouth at bedtime.     . Cholecalciferol (VITAMIN D) 2000 UNITS CAPS Take 1 capsule by mouth at bedtime.     . fluticasone (FLONASE) 50 MCG/ACT nasal spray Place 1 spray into both nostrils daily. 16 g 1  . furosemide (LASIX) 40 MG tablet Take 1 tablet (40 mg total) by mouth daily. 30 tablet 11  . ipratropium-albuterol (DUONEB) 0.5-2.5 (3) MG/3ML SOLN Take 3 mLs by nebulization every 6 (six) hours as needed. 360 mL 0  . lovastatin (MEVACOR) 40 MG tablet take 1 tablet by mouth at bedtime 30 tablet 6  . Miconazole Nitrate 2 % POWD 1 application by Does not apply route daily as needed (for skin). Apply to stomach and under breasts    . montelukast (SINGULAIR) 10 MG tablet Take 1 tablet (10 mg total) by mouth at bedtime. 30 tablet 11  . pantoprazole (PROTONIX) 40 MG tablet Take 1 tablet (40 mg total) by mouth at bedtime. 90 tablet 2  . polyethylene glycol (MIRALAX / GLYCOLAX)  packet Take 17 g by mouth daily. 14 each 0  . potassium chloride (K-DUR,KLOR-CON) 10 MEQ tablet Take 2 tablets (20 mEq total) by mouth daily. 60 tablet 11  . SPIRIVA HANDIHALER 18 MCG inhalation capsule inhale the contents of one capsule in the handihaler once daily (Patient taking differently: inhale the contents of one capsule in the handihaler once nightly) 30 capsule 4  . traMADol (ULTRAM) 50 MG tablet Take 50 mg by mouth every 6 (six) hours as needed for moderate pain.     No current facility-administered medications for this visit.    Allergies:   Codeine; Prednisone; and Sulfonamide derivatives    Social History:  The patient  reports that she quit smoking about 39 years ago. Her smoking use included Cigarettes. She has a 15 pack-year smoking history. She has never used smokeless tobacco. She reports that she does not drink alcohol or use illicit drugs.   Family History:  The patient's family history includes CAD (age of onset: 24) in her son; Cancer in her father; Diabetes in her mother; Heart attack in her maternal grandfather; Heart disease in her mother; Hypertension in her mother; Stroke in her mother. There is no history of Colon cancer, Esophageal cancer, Stomach cancer, or Rectal cancer.    ROS:   Please see the history of present illness.   Review of Systems  Constitution: Negative for fever.  Respiratory: Positive for wheezing.   Hematologic/Lymphatic: Negative for bleeding problem.  Gastrointestinal: Negative for diarrhea, hematochezia, melena and vomiting.  All other systems reviewed and are negative.     PHYSICAL EXAM: VS:  BP 100/70 mmHg  Pulse 86  Ht 5\' 6"  (1.676 m)  Wt 208 lb (94.348 kg)  BMI 33.59 kg/m2    Wt Readings from Last 3 Encounters:  11/26/14 208 lb (94.348 kg)  11/24/14 210 lb 4 oz (95.369 kg)  11/19/14 214 lb 14.4 oz (97.478 kg)     GEN: Well nourished, well developed, in no acute distress HEENT: normal Neck: no JVD, no masses Cardiac:   Normal O3/J0, RRR; 1/6 systolic murmur RUSB, no rubs or gallops, no edema  Respiratory:  Diffuse exp wheezing bilaterally, no rales. GI: soft, nontender, nondistended, + BS MS: no deformity or atrophy Skin: warm and dry  Neuro:  CNs II-XII intact, Strength and sensation are intact Psych: Normal affect  EKG:  EKG is ordered today.  It demonstrates:   NSR, HR 86, RBBB, no change from prior tracing   Recent Labs: 12/13/2013: Magnesium 2.5 08/25/2014: Pro B Natriuretic peptide (BNP) 666.3* 11/16/2014: B Natriuretic Peptide 211.0* 11/24/2014: ALT 42*; BUN 18; Creatinine 1.27*; Hemoglobin 13.1; Platelets 227.0; Potassium 4.1; Sodium 138    Lipid Panel    Component Value Date/Time   CHOL 214* 10/08/2014 1022   TRIG 137.0 10/08/2014 1022   HDL 46.60 10/08/2014 1022   CHOLHDL 5 10/08/2014 1022   VLDL 27.4 10/08/2014 1022   LDLCALC 140* 10/08/2014 1022      ASSESSMENT AND PLAN:  1.  Elevated troponin:  This was likely related to demand ischemia from pancreatitis. I reviewed her case today with Dr. Acie Fredrickson (DOD). We reviewed her prior echocardiogram and prior cardiac cath in 2015 which demonstrated no significant CAD. We do not feel that she needs further cardiac testing at this time. 2.  Essential hypertension: Blood pressure somewhat depressed today. Recent creatinine increased. I have asked her to decrease her Lasix for 3 days, then resume her normal dose. 3.  S/P aortic and mitral valve bioprostheses - 12/2013:  As noted, she may have some patient valve mismatch as outlined by Dr. Sallyanne Kuster in the past. She will continue SBE prophylaxis.   4.  Chronic diastolic heart failure:  Adjust Lasix as noted above.   5.  COPD:  Continue current regimen and FU with pulmonary.    6.  Pancreatitis:  Resolved.  Question if symptoms during admission in November related to pancreatitis.  Current medicines are reviewed at length with the patient today.  The patient does not have concerns regarding  medicines.  The following changes have been made:  As above.   Labs/ tests ordered today include:   Orders Placed This Encounter  Procedures  . EKG 12-Lead    Disposition:   FU with Dr. Sanda Klein  in June 2016 as planned.    Signed, Versie Starks, MHS 11/26/2014 10:29 AM    [facility] [place], [place], [place]  [address] Phone: [phone]; Fax: [phone]

## 2014-11-26 NOTE — Telephone Encounter (Signed)
Patient notified and verbalized understanding. 

## 2014-11-26 NOTE — Patient Instructions (Signed)
Your physician has recommended you make the following change in your medication:  1. DECREASE LASIX TO 20 MG DAILY FOR 3 DAYS THEN RESUME CURRENT DOSE 2. DECREASE POTASSIUM TO 10 MEQ DAILY FOR 3 DAYS THEN RESUME CURRENT DOSE  FOLLOW UP WITH DR. Sallyanne Kuster AS PLANNED

## 2014-11-27 ENCOUNTER — Encounter (HOSPITAL_COMMUNITY)
Admission: RE | Admit: 2014-11-27 | Discharge: 2014-11-27 | Disposition: A | Discharge status: Home / Self Care | Payer: Self-pay | Source: Ambulatory Visit | Attending: Internal Medicine | Admitting: Internal Medicine

## 2014-12-02 ENCOUNTER — Ambulatory Visit: Payer: Medicare Other | Admitting: Physical Therapy

## 2014-12-02 ENCOUNTER — Encounter (HOSPITAL_COMMUNITY)
Admission: RE | Admit: 2014-12-02 | Discharge: 2014-12-02 | Disposition: A | Discharge status: Home / Self Care | Payer: Self-pay | Source: Ambulatory Visit | Attending: Internal Medicine | Admitting: Internal Medicine

## 2014-12-04 ENCOUNTER — Encounter (HOSPITAL_COMMUNITY)
Admission: RE | Admit: 2014-12-04 | Discharge: 2014-12-04 | Disposition: A | Discharge status: Home / Self Care | Payer: Self-pay | Source: Ambulatory Visit | Attending: Internal Medicine | Admitting: Internal Medicine

## 2014-12-08 ENCOUNTER — Other Ambulatory Visit (INDEPENDENT_AMBULATORY_CARE_PROVIDER_SITE_OTHER): Payer: Self-pay | Admitting: General Surgery

## 2014-12-09 ENCOUNTER — Encounter (HOSPITAL_COMMUNITY): Payer: Medicare Other

## 2014-12-09 DIAGNOSIS — R0600 Dyspnea, unspecified: Secondary | ICD-10-CM | POA: Insufficient documentation

## 2014-12-09 DIAGNOSIS — M810 Age-related osteoporosis without current pathological fracture: Secondary | ICD-10-CM | POA: Insufficient documentation

## 2014-12-09 DIAGNOSIS — M8580 Other specified disorders of bone density and structure, unspecified site: Secondary | ICD-10-CM

## 2014-12-09 DIAGNOSIS — Z954 Presence of other heart-valve replacement: Secondary | ICD-10-CM | POA: Insufficient documentation

## 2014-12-09 HISTORY — DX: Other specified disorders of bone density and structure, unspecified site: M85.80

## 2014-12-10 NOTE — Telephone Encounter (Signed)
Spoke to [name] 12/10/14. She will call [facility] (bone density dept) directly to schedule. She is waiting to hear back from surgeon to see when gall bladder surgery will be scheduled.

## 2014-12-11 ENCOUNTER — Ambulatory Visit (INDEPENDENT_AMBULATORY_CARE_PROVIDER_SITE_OTHER): Payer: Medicare Other | Admitting: Internal Medicine

## 2014-12-11 ENCOUNTER — Encounter (HOSPITAL_COMMUNITY): Payer: Self-pay

## 2014-12-11 ENCOUNTER — Encounter: Payer: Self-pay | Admitting: Internal Medicine

## 2014-12-11 VITALS — BP 138/80 | HR 99 | Temp 98.0°F | Resp 20 | Wt 209.0 lb

## 2014-12-11 DIAGNOSIS — J441 Chronic obstructive pulmonary disease with (acute) exacerbation: Secondary | ICD-10-CM | POA: Insufficient documentation

## 2014-12-11 DIAGNOSIS — J209 Acute bronchitis, unspecified: Secondary | ICD-10-CM

## 2014-12-11 MED ORDER — METHYLPREDNISOLONE (PAK) 4 MG PO TABS: ORAL_TABLET | ORAL | Status: DC

## 2014-12-11 MED ORDER — AMOXICILLIN 500 MG PO TABS: 1000.0000 mg | ORAL_TABLET | Freq: Two times a day (BID) | ORAL | Status: DC

## 2014-12-11 NOTE — Progress Notes (Signed)
Subjective:    Patient ID: [name], female    DOB: 09/02/1946, 69 y.o.   MRN: [id number]  HPI  "I have this crud" Worried about her usual bronchitis Started with sore throat and in chest Now moved up into her sinuses also Worried about her COPD  No fever Easier DOE than usual Some wheezing--using rescue inhaler but not nebulizer today No sputum Slight headache Some "discomfort" in ears--not really pain  Has taken sinutab and regular meds  Current Outpatient Prescriptions on File Prior to Visit  Medication Sig Dispense Refill  . albuterol (PROVENTIL HFA;VENTOLIN HFA) 108 (90 BASE) MCG/ACT inhaler Inhale 2 puffs into the lungs every 6 (six) hours as needed for wheezing.    Marland Kitchen aspirin 81 MG tablet Take 81 mg by mouth at bedtime.     Marland Kitchen BIOTIN PO Take 1 tablet by mouth at bedtime.     Marland Kitchen BREO ELLIPTA 100-25 MCG/INH AEPB inhale 1 puff by mouth once daily 60 each 5  . Calcium Carbonate-Vitamin D (CALCIUM 600 + D PO) Take 1,200 mg by mouth at bedtime.     . Cholecalciferol (VITAMIN D) 2000 UNITS CAPS Take 1 capsule by mouth at bedtime.     . fluticasone (FLONASE) 50 MCG/ACT nasal spray Place 1 spray into both nostrils daily. 16 g 1  . furosemide (LASIX) 40 MG tablet Take 1 tablet (40 mg total) by mouth daily. 30 tablet 11  . ipratropium-albuterol (DUONEB) 0.5-2.5 (3) MG/3ML SOLN Take 3 mLs by nebulization every 6 (six) hours as needed. 360 mL 0  . lovastatin (MEVACOR) 40 MG tablet take 1 tablet by mouth at bedtime 30 tablet 6  . Miconazole Nitrate 2 % POWD 1 application by Does not apply route daily as needed (for skin). Apply to stomach and under breasts    . montelukast (SINGULAIR) 10 MG tablet Take 1 tablet (10 mg total) by mouth at bedtime. 30 tablet 11  . pantoprazole (PROTONIX) 40 MG tablet Take 1 tablet (40 mg total) by mouth at bedtime. 90 tablet 2  . polyethylene glycol (MIRALAX / GLYCOLAX) packet Take 17 g by mouth daily. 14 each 0  . potassium chloride  (K-DUR,KLOR-CON) 10 MEQ tablet Take 2 tablets (20 mEq total) by mouth daily. 60 tablet 11  . SPIRIVA HANDIHALER 18 MCG inhalation capsule inhale the contents of one capsule in the handihaler once daily (Patient taking differently: inhale the contents of one capsule in the handihaler once nightly) 30 capsule 4  . traMADol (ULTRAM) 50 MG tablet Take 50 mg by mouth every 6 (six) hours as needed for moderate pain.     No current facility-administered medications on file prior to visit.    Allergies  Allergen Reactions  . Codeine Nausea Only  . Prednisone Hives    In different places all over the body.  . Sulfonamide Derivatives Nausea Only    Past Medical History  Diagnosis Date  . IBS (irritable bowel syndrome)   . COPD (chronic obstructive pulmonary disease)   . Asthma   . Lung nodule     RLL nodule-65mm stable 2006, April 2009, and June 2009  . Anemia   . Hyperlipemia   . Obesity   . Essential hypertension   . Sleep apnea     PT TOLD BORDERLINE-TRIED CPAP-DID NOT HELP-SHE DOES NOT USE CPAP  . Arthritis   . History of radiation therapy 05/30/11 to 07/07/11    rectum  . History of pneumonia     several  times  . History of shingles   . Rectal cancer 04/2011    T3N0; s/p lap LAR, s/p ileostomy, s/p reversal, s/p chemo  . Severe aortic valve stenosis  AVR 3/15 10/01/2013    mild-mod by echo 02/2014  . Rheumatic heart disease mitral stenosis     mod MS by echo 02/2014  . Anxiety   . S/P AVR (aortic valve replacement) 2015    bioprosthetic (Bartle)  . S/P MVR (mitral valve replacement) 2015    bioprosthetic (Bartle)  . DDD (degenerative disc disease)     cervical - kyphosis with mod DD changes C5/6 and C6/7; lumbar - early DD at L2/3 (Elsner)  . Chronic diastolic heart failure 65/12/5463  . Small bowel obstruction, partial 08/2013    reolved without NGT placement.   . Pancreatitis 11/2014    ?zpack related vs gallstone pancreatitis with abnormal HIDA scan pending cholecystectomy      Past Surgical History  Procedure Laterality Date  . Foot surgery  left foot    hammer toe  . Hand surgery  Bil    carpal tunnel  . Back surgery  2006, 2007    2006 SPACER, 2007 decompression and fusion L4/5  . Tonsillectomy    . Colon resection  08/25/2011    Procedure: COLON RESECTION LAPAROSCOPIC;  Surgeon: Stark Klein, MD;  Location: [facility];  Service: General;  Laterality: N/A;  Laparoscopic Assisted Low Anterior Resection Diverting Ostomy and onQ pain pump  . Ileostomy  08/25/2011  . Ileostomy closure  04/02/2012    Procedure: ILEOSTOMY TAKEDOWN;  Surgeon: Stark Klein, MD;  Location: [facility];  Service: General;  Laterality: N/A;  . Bowel resection  04/02/2012    Procedure: SMALL BOWEL RESECTION;  Surgeon: Stark Klein, MD;  Location: [facility];  Service: General;  Laterality: N/A;  . Carpal tunnel release    . Cardiac catheterization  11/27/2013  . Tee without cardioversion N/A 11/29/2013    Procedure: TRANSESOPHAGEAL ECHOCARDIOGRAM (TEE);  Surgeon: Sueanne Margarita, MD;  Location: [facility];  Service: Cardiovascular;  Laterality: N/A;  . Aortic valve replacement N/A 12/12/2013    Procedure: AORTIC VALVE REPLACEMENT (AVR);  Surgeon: Gaye Pollack, MD; Service: Open Heart Surgery  . Mitral valve replacement N/A 12/12/2013    Procedure: MITRAL VALVE (MV) REPLACEMENT OR REPAIR;  Surgeon: Gaye Pollack, MD; Service: Open Heart Surgery  . Intraoperative transesophageal echocardiogram N/A 12/12/2013    Procedure: INTRAOPERATIVE TRANSESOPHAGEAL ECHOCARDIOGRAM;  Surgeon: Gaye Pollack, MD;  Location[facility];  Service: Open Heart Surgery;  Laterality: N/A;  . Colonoscopy  03/2013    1 polyp, rpt 3 yrs Ardis Hughs)  . Left and right heart catheterization with coronary angiogram N/A 11/27/2013    Procedure: LEFT AND RIGHT HEART CATHETERIZATION WITH CORONARY ANGIOGRAM;  Surgeon: Sanda Klein, MD;  Location: [facility];  Service: Cardiovascular;  Laterality: N/A;    Family History  Problem  Relation Age of Onset  . Cancer Father     lung  . Heart disease Mother   . Diabetes Mother   . Hypertension Mother   . Colon cancer Neg Hx   . Esophageal cancer Neg Hx   . Stomach cancer Neg Hx   . Rectal cancer Neg Hx   . CAD Son 9    MI - deceased  . Stroke Mother   . Heart attack Maternal Grandfather     History   Social History  . Marital Status: Married    Spouse Name: N/A  .  Number of Children: N/A  . Years of Education: N/A   Occupational History  . retired    Social History Main Topics  . Smoking status: Former Smoker -- 1.50 packs/day for 10 years    Types: Cigarettes    Quit date: 12/27/1974  . Smokeless tobacco: Never Used  . Alcohol Use: No     Comment: rare  . Drug Use: No  . Sexual Activity: No   Other Topics Concern  . Not on file   Social History Narrative   Lives with husband and 1 cat.    2 sons; 1 grandchild   Married 1966   Retired-[employer], volunteers at [employer]   Activity: pulm rehab   Diet: some water, fruits/vegetables daily   Review of Systems Did meet the surgeon--- will plan cholecystectomy in the near future. Appetite okay--being careful No N/V No rash    Objective:   Physical Exam  Constitutional: She appears well-developed and well-nourished. No distress.  HENT:  No sinus tenderness Mild pharyngeal injection and nasal inflammation TMs normal  Neck: Normal range of motion. Neck supple.  Pulmonary/Chest: Effort normal. No respiratory distress. She has no rales.  Mild prolonged expiratory phase and wheezing Only slightly tight  Lymphadenopathy:    She has no cervical adenopathy.          Assessment & Plan:

## 2014-12-11 NOTE — Progress Notes (Signed)
Pre visit review using our clinic review tool, if applicable. No additional management support is needed unless otherwise documented below in the visit note. 

## 2014-12-11 NOTE — Assessment & Plan Note (Signed)
Probably viral Will give Rx for amoxil to start if cough becomes productive, etc

## 2014-12-11 NOTE — Patient Instructions (Signed)
Please start the antibiotic if you are not feeling better into next week, or if you develop significant mucus production.

## 2014-12-11 NOTE — Assessment & Plan Note (Signed)
Mild but clearly exacerbated Asked her to start the nebs Will give Rx for solumedrol

## 2014-12-16 ENCOUNTER — Ambulatory Visit: Payer: Medicare Other | Attending: General Surgery | Admitting: Physical Therapy

## 2014-12-16 ENCOUNTER — Encounter: Payer: Self-pay | Admitting: Physical Therapy

## 2014-12-16 ENCOUNTER — Encounter (HOSPITAL_COMMUNITY)
Admission: RE | Admit: 2014-12-16 | Discharge: 2014-12-16 | Disposition: A | Discharge status: Home / Self Care | Payer: Self-pay | Source: Ambulatory Visit | Attending: Internal Medicine | Admitting: Internal Medicine

## 2014-12-16 DIAGNOSIS — C20 Malignant neoplasm of rectum: Secondary | ICD-10-CM

## 2014-12-16 DIAGNOSIS — N8189 Other female genital prolapse: Secondary | ICD-10-CM | POA: Insufficient documentation

## 2014-12-16 NOTE — Therapy (Addendum)
[facility] [address], [place], [place], [address] Phone: [phone]   Fax:  [phone]  Physical Therapy Treatment  Patient Details  Name: [name] MRN: [id number] Date of Birth: 20-Aug-1946 Referring Provider:  Stark Klein, MD  Encounter Date: 12/16/2014      PT End of Session - 12/16/14 1310    Visit Number 9   Number of Visits 10  Medicare   Date for PT Re-Evaluation 01/01/15   PT Start Time 1230   PT Stop Time 1310   PT Time Calculation (min) 40 min   Activity Tolerance Patient tolerated treatment well   Behavior During Therapy [facility] for tasks assessed/performed      Past Medical History  Diagnosis Date  . IBS (irritable bowel syndrome)   . COPD (chronic obstructive pulmonary disease)   . Asthma   . Lung nodule     RLL nodule-64m stable 2006, April 2009, and June 2009  . Anemia   . Hyperlipemia   . Obesity   . Essential hypertension   . Sleep apnea     PT TOLD BORDERLINE-TRIED CPAP-DID NOT HELP-SHE DOES NOT USE CPAP  . Arthritis   . History of radiation therapy 05/30/11 to 07/07/11    rectum  . History of pneumonia     several times  . History of shingles   . Rectal cancer 04/2011    T3N0; s/p lap LAR, s/p ileostomy, s/p reversal, s/p chemo  . Severe aortic valve stenosis  AVR 3/15 10/01/2013    mild-mod by echo 02/2014  . Rheumatic heart disease mitral stenosis     mod MS by echo 02/2014  . Anxiety   . S/P AVR (aortic valve replacement) 2015    bioprosthetic (Bartle)  . S/P MVR (mitral valve replacement) 2015    bioprosthetic (Bartle)  . DDD (degenerative disc disease)     cervical - kyphosis with mod DD changes C5/6 and C6/7; lumbar - early DD at L2/3 (Elsner)  . Chronic diastolic heart failure 183/38/2505 . Small bowel obstruction, partial 08/2013    reolved without NGT placement.   . Pancreatitis 11/2014    ?zpack related vs gallstone pancreatitis with abnormal HIDA scan pending  cholecystectomy    Past Surgical History  Procedure Laterality Date  . Foot surgery  left foot    hammer toe  . Hand surgery  Bil    carpal tunnel  . Back surgery  2006, 2007    2006 SPACER, 2007 decompression and fusion L4/5  . Tonsillectomy    . Colon resection  08/25/2011    Procedure: COLON RESECTION LAPAROSCOPIC;  Surgeon: FStark Klein MD;  Location: [facility];  Service: General;  Laterality: N/A;  Laparoscopic Assisted Low Anterior Resection Diverting Ostomy and onQ pain pump  . Ileostomy  08/25/2011  . Ileostomy closure  04/02/2012    Procedure: ILEOSTOMY TAKEDOWN;  Surgeon: FStark Klein MD;  Location: [facility];  Service: General;  Laterality: N/A;  . Bowel resection  04/02/2012    Procedure: SMALL BOWEL RESECTION;  Surgeon: FStark Klein MD;  Location: [facility];  Service: General;  Laterality: N/A;  . Carpal tunnel release    . Cardiac catheterization  11/27/2013  . Tee without cardioversion N/A 11/29/2013    Procedure: TRANSESOPHAGEAL ECHOCARDIOGRAM (TEE);  Surgeon: TSueanne Margarita MD;  [place] [facility]  Service: Cardiovascular;  Laterality: N/A;  . Aortic valve replacement N/A 12/12/2013    Procedure: AORTIC VALVE REPLACEMENT (AVR);  Surgeon:  Gaye Pollack, MD; Service: Open Heart Surgery  . Mitral valve replacement N/A 12/12/2013    Procedure: MITRAL VALVE (MV) REPLACEMENT OR REPAIR;  Surgeon: Gaye Pollack, MD; Service: Open Heart Surgery  . Intraoperative transesophageal echocardiogram N/A 12/12/2013    Procedure: INTRAOPERATIVE TRANSESOPHAGEAL ECHOCARDIOGRAM;  Surgeon: Gaye Pollack, MD;  Location[facility];  Service: Open Heart Surgery;  Laterality: N/A;  . Colonoscopy  03/2013    1 polyp, rpt 3 yrs Ardis Hughs)  . Left and right heart catheterization with coronary angiogram N/A 11/27/2013    Procedure: LEFT AND RIGHT HEART CATHETERIZATION WITH CORONARY ANGIOGRAM;  Surgeon: Sanda Klein, MD;  Location: [facility] CATH [facility];  Service: Cardiovascular;  Laterality: N/A;    There were no  vitals taken for this visit.  Visit Diagnosis:  Rectal cancer      Subjective Assessment - 12/16/14 1222    Symptoms Urinary leakage got worse due to having bronchitis and coughing alot.  Gas has improved by 40%. emptying her bowels improved by 20%.    Pertinent History Gallbladder will be removed in the next few weeks.    Patient Stated Goals reduce urinary leakage and uncontrallable gas   Currently in Pain? No/denies   Multiple Pain Sites No          [facility] PT Assessment - 12/16/14 0001    Assessment   Medical Diagnosis Rectal cancer   Onset Date 09/11/14   Precautions   Precautions Other (comment)   Precaution Comments No ultrasound   Balance Screen   Has the patient fallen in the past 6 months No   Has the patient had a decrease in activity level because of a fear of falling?  No   Is the patient reluctant to leave their home because of a fear of falling?  No   Prior Function   Level of Independence Independent with basic ADLs   Observation/Other Assessments   Other Surveys  --  CRAD-8 score is 13 points                Pelvic Floor Special Questions - 12/16/14 0001    Urinary Leakage Yes   Palpation Circular sontractoin with lift   Strength good squeeze, good lift, able to hold agaisnt strong resistance   Strength # of reps 5   Strength # of seconds 5          [facility] Adult PT Treatment/Exercise - 12/16/14 0001    Posture/Postural Control   Posture Comments --  educated pt. on increased lumbar lordosis & thoracic ext.   Lumbar Exercises: Seated   Other Seated Lumbar Exercises sit with ant/post. tilt on ball and chair  Patient required verbal and tactile cues   Manual Therapy   Manual Therapy Internal Pelvic Floor   Internal Pelvic Floor Patient confirmed identification and approved internal treatment through the vagina. soft tissue work to bilateral levator ani, obturator internist, myofascial release of urethra and rectum                PT  Education - 12/16/14 1308    Education provided Yes   Education Details pelvic floor contraction with sneeze and cough, pelvic mobility, posture   Person(s) Educated Patient   Methods Explanation;Demonstration;Handout;Verbal cues;Tactile cues   Comprehension Verbalized understanding;Returned demonstration;Verbal cues required;Need further instruction          PT Short Term Goals - 11/25/14 1319    PT SHORT TERM GOAL #1   Title be independent with initial HEP for  flexibility exercises   Time 8   Period Weeks   Status Achieved   PT SHORT TERM GOAL #2   Title be independent with initial HEP for abdominal contraction    Time 8   Period Weeks   Status Achieved   PT SHORT TERM GOAL #3   Title be independent with abdominal massage   Time 8   Period Weeks   Status Achieved   PT SHORT TERM GOAL #4   Title understand correct toileting techniques to fully evacuate bowels   Time 8   Period Weeks   Status Achieved           PT Long Term Goals - 12/16/14 1312    PT LONG TERM GOAL #1   Title demonstrate and/or verbalize techniques to reduce the risk of re-injury to include info on posture, body mechanics   Time 8   Period Weeks   Status Achieved   PT LONG TERM GOAL #2   Title be independent with advanced HEP for pelvic floor contraction    Time 8   Period Weeks   Status On-going  learning more exercises   PT LONG TERM GOAL #3   Title urinary leakage decreased >/= 50%   Time 8   Period Weeks   Status New  improved by 40%   PT LONG TERM GOAL #4   Title control gas >/= 70% of time due to incresaed pelvic floor strength   Time 8   Period Weeks   Status On-going  improved by 40%   PT LONG TERM GOAL #5   Title ability to fully empty her bowels 75% of the time   Time 8   Period Weeks   Status On-going  improved by 10%               Plan - 12/16/14 1310    Clinical Impression Statement Patient has difficulty with lumbar mobility, bear down with pelvic floor  with sneeze and cough, increased pelvic floor strength   Pt will benefit from skilled therapeutic intervention in order to improve on the following deficits Increased fascial restricitons;Decreased strength;Cardiopulmonary status limiting activity;Decreased coordination;Decreased range of motion;Postural dysfunction   Rehab Potential Good   Clinical Impairments Affecting Rehab Potential None   PT Frequency 1x / week   PT Duration 8 weeks   PT Treatment/Interventions Neuromuscular re-education;Manual techniques;Therapeutic exercise;Patient/family education;Therapeutic activities   PT Next Visit Plan D/C due to having gall bladder surgery   PT Home Exercise Plan work on posture, increase lumbar mobility, progress pelvic floor strength   Recommended Other Services None   Consulted and Agree with Plan of Care Patient        Problem List Patient Active Problem List   Diagnosis Date Noted  . COPD exacerbation 12/11/2014  . Abnormal CT scan, gallbladder   . Epigastric pain   . Acute bronchitis 11/17/2014  . Pancreatitis, acute 11/16/2014  . Medicare annual wellness visit, initial 10/15/2014  . Health maintenance examination 10/15/2014  . Grieving 10/15/2014  . Chronic diastolic heart failure 62/83/1517  . Chest pain 08/25/2014  . Oral thrush 02/12/2014  . 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 Pulmonary nodule 02/23/2012  . History of radiation therapy   . Rectal cancer, midrectum ypT3ypN0, s/p neoadj chemoXRT, lap LAR with diverting loop ileostomy 08/2011, ileostomy takedown 03/2012 05/26/2011  . Essential hypertension, benign 03/24/2009  . Morbid obesity 03/05/2008  . BACK PAIN 03/05/2008  . MERALGIA PARESTHETICA 12/13/2007  .  HLD (hyperlipidemia) 05/03/2007  . Asthma 05/03/2007  . COPD (chronic obstructive pulmonary disease) 05/03/2007    Armanie Ullmer, PT 12/16/2014, 1:16 PM   [facility] [address] [place], [place], [address] Phone: [phone]   Fax:  [phone]     PHYSICAL THERAPY DISCHARGE SUMMARY  Visits from Start of Care:9 Current functional level related to goals / functional outcomes: Patient has achieved all of her STG's.   Patient is independent with her current HEP. Urinary leakage improve by 40%. Control of gas improved by 40%. Ability for the to fully empty her bowels is 10%.    Remaining deficits: Patient was not able to come to her last scheduled physical therapy appointment due to having an intestinal virus.  Patient is schedule for Gallbladder surgery on 01/22/2015. Patient will be discharged due to a scheduled surgery.    Education / Equipment: HEP  Plan: Patient agrees to discharge.  Patient goals were partially met. Patient is being discharged due to a change in medical status.  After patient has gall bladder surgery she would benefit from further physical therapy to work on pelvic floor strengthening and coordination. Thank you for the referral. ?????    Earlie Counts, PT 12/25/2014 3:53 PM

## 2014-12-16 NOTE — Patient Instructions (Addendum)
Cough: Phase 3 (Supine)   Lie flat. Squeeze pelvic floor and hold. Inhale. Lift head and shoulders. Cough. Relax. Repeat ___ times. Do ___ times a day.   Copyright  VHI. All rights reserved.  Cough: Phase 3 (Supine)   Lie with knees bent. Squeeze pelvic floor and hold. Inhale. Lift head and shoulders. Do not push stomach out. Cough. Relax. Repeat _10__ times. Do _2__ times a day.   Copyright  VHI. All rights reserved.  Sneeze: Phase 4 (Supine)   Squeeze pelvic floor and hold. Inhale. Lift head and shoulders. Push abdomen downward.  Pretend to sneeze repeatedly. Relax. Repeat _10__ times. Do _1__ times a day.   Copyright  VHI. All rights reserved.  Sit in chair tilt pelvis forward and backward 10x 3 times per day Patient able to return demonstration correctly but required verbal cues

## 2014-12-18 ENCOUNTER — Encounter (HOSPITAL_COMMUNITY)
Admission: RE | Admit: 2014-12-18 | Discharge: 2014-12-18 | Disposition: A | Discharge status: Home / Self Care | Payer: Self-pay | Source: Ambulatory Visit | Attending: Internal Medicine | Admitting: Internal Medicine

## 2014-12-23 ENCOUNTER — Encounter (HOSPITAL_COMMUNITY): Payer: Self-pay

## 2014-12-25 ENCOUNTER — Encounter: Payer: Self-pay | Admitting: Physical Therapy

## 2014-12-25 ENCOUNTER — Encounter (HOSPITAL_COMMUNITY): Payer: Self-pay

## 2014-12-29 ENCOUNTER — Other Ambulatory Visit: Payer: Self-pay

## 2014-12-29 DIAGNOSIS — Z1231 Encounter for screening mammogram for malignant neoplasm of breast: Secondary | ICD-10-CM

## 2014-12-30 ENCOUNTER — Encounter (HOSPITAL_COMMUNITY)
Admission: RE | Admit: 2014-12-30 | Discharge: 2014-12-30 | Disposition: A | Discharge status: Home / Self Care | Payer: Self-pay | Source: Ambulatory Visit | Attending: Internal Medicine | Admitting: Internal Medicine

## 2015-01-01 ENCOUNTER — Encounter (HOSPITAL_COMMUNITY)
Admission: RE | Admit: 2015-01-01 | Discharge: 2015-01-01 | Disposition: A | Discharge status: Home / Self Care | Payer: Self-pay | Source: Ambulatory Visit | Attending: Internal Medicine | Admitting: Internal Medicine

## 2015-01-06 ENCOUNTER — Encounter (HOSPITAL_COMMUNITY)
Admission: RE | Admit: 2015-01-06 | Discharge: 2015-01-06 | Disposition: A | Discharge status: Home / Self Care | Payer: Self-pay | Source: Ambulatory Visit | Attending: Internal Medicine | Admitting: Internal Medicine

## 2015-01-06 ENCOUNTER — Ambulatory Visit (INDEPENDENT_AMBULATORY_CARE_PROVIDER_SITE_OTHER)
Admission: RE | Admit: 2015-01-06 | Discharge: 2015-01-06 | Disposition: A | Discharge status: Home / Self Care | Payer: Medicare Other | Source: Ambulatory Visit | Attending: Family Medicine | Admitting: Family Medicine

## 2015-01-06 DIAGNOSIS — E2839 Other primary ovarian failure: Secondary | ICD-10-CM

## 2015-01-08 ENCOUNTER — Encounter (HOSPITAL_COMMUNITY)
Admission: RE | Admit: 2015-01-08 | Discharge: 2015-01-08 | Disposition: A | Discharge status: Home / Self Care | Payer: Self-pay | Source: Ambulatory Visit | Attending: Internal Medicine | Admitting: Internal Medicine

## 2015-01-12 ENCOUNTER — Encounter: Payer: Self-pay | Admitting: Family Medicine

## 2015-01-12 NOTE — Pre-Procedure Instructions (Signed)
Naiomy ALYRA PATTY  01/12/2015   Your procedure is scheduled on:  Wed, April 13 @ 9:00 AM  Report to [facility]  at 7:00 AM.  Call this number if you have problems the morning of surgery: [phone]   Remember:   Do not eat food or drink liquids after midnight.   Take these medicines the morning of surgery with A SIP OF WATER: Albuterol<Bring Your Inhaler With You>,Dueneb(if needed),Spiriva,Tramadol(Ultram-if needed)               Stop taking your Aspirin,Vitamins,and any Herbal Medications. No Goody's,BC's,Aleve,Ibuprofen,or Fish Oil.    Do not wear jewelry, make-up or nail polish.  Do not wear lotions, powders, or perfumes. You may wear deodorant.  Do not shave 48 hours prior to surgery.   Do not bring valuables to the hospital.  [facility] is not responsible                  for any belongings or valuables.               Contacts, dentures or bridgework may not be worn into surgery.  Leave suitcase in the car. After surgery it may be brought to your room.  For patients admitted to the hospital, discharge time is determined by your                treatment team.                 Special Instructions:  [place] - Preparing for Surgery  Before surgery, you can play an important role.  Because skin is not sterile, your skin needs to be as free of germs as possible.  You can reduce the number of germs on you skin by washing with CHG (chlorahexidine gluconate) soap before surgery.  CHG is an antiseptic cleaner which kills germs and bonds with the skin to continue killing germs even after washing.  Please DO NOT use if you have an allergy to CHG or antibacterial soaps.  If your skin becomes reddened/irritated stop using the CHG and inform your nurse when you arrive at Short Stay.  Do not shave (including legs and underarms) for at least 48 hours prior to the first CHG shower.  You may shave your face.  Please follow these instructions carefully:   1.  Shower with CHG Soap  the night before surgery and the                                morning of Surgery.  2.  If you choose to wash your hair, wash your hair first as usual with your       normal shampoo.  3.  After you shampoo, rinse your hair and body thoroughly to remove the                      Shampoo.  4.  Use CHG as you would any other liquid soap.  You can apply chg directly       to the skin and wash gently with scrungie or a clean washcloth.  5.  Apply the CHG Soap to your body ONLY FROM THE NECK DOWN.        Do not use on open wounds or open sores.  Avoid contact with your eyes,       ears, mouth and genitals (private parts).  Wash genitals (private  parts)       with your normal soap.  6.  Wash thoroughly, paying special attention to the area where your surgery        will be performed.  7.  Thoroughly rinse your body with warm water from the neck down.  8.  DO NOT shower/wash with your normal soap after using and rinsing off       the CHG Soap.  9.  Pat yourself dry with a clean towel.            10.  Wear clean pajamas.            11.  Place clean sheets on your bed the night of your first shower and do not        sleep with pets.  Day of Surgery  Do not apply any lotions/deoderants the morning of surgery.  Please wear clean clothes to the hospital/surgery center.     Please read over the following fact sheets that you were given: Pain Booklet, Coughing and Deep Breathing and Surgical Site Infection Prevention

## 2015-01-13 ENCOUNTER — Encounter (HOSPITAL_COMMUNITY): Payer: Self-pay

## 2015-01-13 ENCOUNTER — Encounter: Payer: Self-pay | Admitting: Cardiovascular Disease

## 2015-01-13 ENCOUNTER — Encounter (HOSPITAL_COMMUNITY)
Admission: RE | Admit: 2015-01-13 | Discharge: 2015-01-13 | Disposition: A | Discharge status: Home / Self Care | Payer: Medicare Other | Source: Ambulatory Visit | Attending: General Surgery | Admitting: General Surgery

## 2015-01-13 ENCOUNTER — Encounter (HOSPITAL_COMMUNITY)
Admission: RE | Admit: 2015-01-13 | Discharge: 2015-01-13 | Disposition: A | Discharge status: Home / Self Care | Payer: Self-pay | Source: Ambulatory Visit | Attending: Internal Medicine | Admitting: Internal Medicine

## 2015-01-13 DIAGNOSIS — Z01812 Encounter for preprocedural laboratory examination: Secondary | ICD-10-CM | POA: Diagnosis not present

## 2015-01-13 DIAGNOSIS — R0600 Dyspnea, unspecified: Secondary | ICD-10-CM | POA: Insufficient documentation

## 2015-01-13 DIAGNOSIS — Z954 Presence of other heart-valve replacement: Secondary | ICD-10-CM | POA: Insufficient documentation

## 2015-01-13 HISTORY — DX: Constipation, unspecified: K59.00

## 2015-01-13 HISTORY — DX: Unspecified convulsions: R56.9

## 2015-01-13 HISTORY — DX: Gastro-esophageal reflux disease without esophagitis: K21.9

## 2015-01-13 HISTORY — DX: Unspecified cataract: H26.9

## 2015-01-13 LAB — COMPREHENSIVE METABOLIC PANEL
ALT: 15 U/L (ref 0–35)
AST: 25 U/L (ref 0–37)
Albumin: 3.6 g/dL (ref 3.5–5.2)
Alkaline Phosphatase: 87 U/L (ref 39–117)
Anion gap: 5 (ref 5–15)
BUN: 21 mg/dL (ref 6–23)
CO2: 32 mmol/L (ref 19–32)
Calcium: 9.5 mg/dL (ref 8.4–10.5)
Chloride: 104 mmol/L (ref 96–112)
Creatinine, Ser: 1.33 mg/dL — ABNORMAL HIGH (ref 0.50–1.10)
GFR calc Af Amer: 46 mL/min — ABNORMAL LOW (ref >90)
GFR calc non Af Amer: 40 mL/min — ABNORMAL LOW (ref >90)
Glucose, Bld: 91 mg/dL (ref 70–99)
Potassium: 4.6 mmol/L (ref 3.5–5.1)
Sodium: 141 mmol/L (ref 135–145)
Total Bilirubin: 0.8 mg/dL (ref 0.3–1.2)
Total Protein: 6.9 g/dL (ref 6.0–8.3)

## 2015-01-13 LAB — CBC WITH DIFFERENTIAL/PLATELET
Basophils Absolute: 0.1 10*3/uL (ref 0.0–0.1)
Basophils Relative: 1 % (ref 0–1)
Eosinophils Absolute: 0.1 10*3/uL (ref 0.0–0.7)
Eosinophils Relative: 3 % (ref 0–5)
HCT: 39.5 % (ref 36.0–46.0)
Hemoglobin: 12.8 g/dL (ref 12.0–15.0)
Lymphocytes Relative: 17 % (ref 12–46)
Lymphs Abs: 0.9 10*3/uL (ref 0.7–4.0)
MCH: 29 pg (ref 26.0–34.0)
MCHC: 32.4 g/dL (ref 30.0–36.0)
MCV: 89.4 fL (ref 78.0–100.0)
Monocytes Absolute: 0.6 10*3/uL (ref 0.1–1.0)
Monocytes Relative: 11 % (ref 3–12)
Neutro Abs: 3.5 10*3/uL (ref 1.7–7.7)
Neutrophils Relative %: 68 % (ref 43–77)
Platelets: 159 10*3/uL (ref 150–400)
RBC: 4.42 MIL/uL (ref 3.87–5.11)
RDW: 14.7 % (ref 11.5–15.5)
WBC: 5.1 10*3/uL (ref 4.0–10.5)

## 2015-01-13 LAB — PROTIME-INR
INR: 1.08 (ref 0.00–1.49)
Prothrombin Time: 14.1 seconds (ref 11.6–15.2)

## 2015-01-13 NOTE — Progress Notes (Addendum)
Dr.Croitoru is cardiologist with last visit in epic from 09-19-14  Multiple echo reports in epic with most recent in 2015  Heart cath in epic from 2015  EKG in epic from 11-26-14  CXR in epic from 08-25-14 and also again 11/2014  Stress test done about 39yrs ago  Medical Md is Dr.Gutierrez

## 2015-01-13 NOTE — Progress Notes (Signed)
Requested cardiac clearance from Dr.Byerly's office.

## 2015-01-14 ENCOUNTER — Other Ambulatory Visit (HOSPITAL_BASED_OUTPATIENT_CLINIC_OR_DEPARTMENT_OTHER): Payer: Medicare Other

## 2015-01-14 ENCOUNTER — Telehealth: Payer: Self-pay

## 2015-01-14 DIAGNOSIS — Z85048 Personal history of other malignant neoplasm of rectum, rectosigmoid junction, and anus: Secondary | ICD-10-CM

## 2015-01-14 DIAGNOSIS — C20 Malignant neoplasm of rectum: Secondary | ICD-10-CM

## 2015-01-14 LAB — CEA: CEA: <0.5 ng/mL (ref 0.0–5.0)

## 2015-01-14 NOTE — Progress Notes (Addendum)
Anesthesia Chart Review:  Patient is a 69 year old female scheduled for lap chole on 01/21/15 by Dr. Barry Dienes.  History includes rheumatic heart disease with severe AS/MS s/p AV replacement (pericardial tissue) and MV replacement (pericardial tissue) 12/12/13 (Dr. 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  PCP is Dr. Encarnacion Slates. HEM-ONC is Dr. Benay Spice. Pulmonologist is Dr. [name]. Cardiologist is Dr. Sallyanne Kuster who felt patient was low to moderate risk from a cardiac standpoint for cholecystectomy.  She should receive IV antibiotics for endocarditis prevention and use caution with IVF due to history of diastolic CHF.   Meds include albuterol, Breo Ellipta, Lasix, Duoneb, Mevacor, Singulair, Protonix, K-dur, Spiriva, tramadol.  11/26/14 EKG ([facility]): NSR, right BBB, left posterior fascicular block, bifascicular block.   Cardiopulmonary stress test 06/13/2014: Conclusion: Exercise testing with gas exchange demonstrates a low normal functional capacity when compared to matched sedentary norms. The significant improvement in the relative VO2 when adjusted for ideal body may reflect the patient's overall functional limitation being related to her body habitus. There does not appear to be a significant circulatory or ventilatory limitation to the exercise. Deconditioning likely playing a role in her symptoms.   08/26/14 Echo:  - Left ventricle: The cavity size was normal. There was mild concentric hypertrophy. Systolic function was normal. The estimated ejection fraction was in the range of 60% to 65%. Wall motion was normal; there were no  regional wall motion abnormalities. - Aortic valve: Poorly visualized. A bioprosthesis was present. - Mitral valve: There is a MV prosthesis in place which is poorly visualized. There is heavy calcification of the MV annulus and some degree of calcification and thickening of the valve leaflets but cannot visualize adequately to comment any further. The mean MV gradient is 57mmHg consistent with severe mitral stenosis but the pressure halftime is normal Recommend TEE for further evaluation if clinically indicated. Poorly visualized. A bioprosthesis was present. - Pulmonic valve: There was trivial regurgitation. (Dr. Sallyanne Kuster did not order a TEE.  He recommended to avoid tachycardia since the mitral prosthesis is intrinsically stenotic.)  05/2014 Event monitor summary: Brief paroxysmal atrial tachycardia, frequent PACs and PVCs. Atrial fibrillation is not seen.  Cardiac Cath 11/25/2013 LAD: Luminal irregularities LCx: Luminal irregularities, distal 30% RCA: Luminal irregularities Hyperdynamic left ventricular function. EF > 70% Severe aortic stenosis Moderate mitral valve stenosis Mild pulmonary artery hypertension   11/26/13 carotid duplex: Summary: Bilateral - 40% to 59% ICA stenosis. Vertebral artery flow is antegrade.  Preoperative labs noted.   If no acute changes in her cardiopulmonary status then I would anticipate that she could proceed as planned.  [facility] Stay [facility]/Anesthesiology Phone [phone] 01/15/2015 6:12 PM

## 2015-01-14 NOTE — Telephone Encounter (Signed)
Pt called stating her CEA was drawn today and she wants it to be posted to MyChart when available

## 2015-01-14 NOTE — Telephone Encounter (Signed)
CEA sent via mychart.

## 2015-01-15 ENCOUNTER — Telehealth: Payer: Self-pay | Admitting: *Deleted

## 2015-01-15 ENCOUNTER — Encounter (HOSPITAL_COMMUNITY): Payer: Self-pay

## 2015-01-15 NOTE — Telephone Encounter (Signed)
Surgical clearance sent via Epic 01/13/15 and faxed 01/15/15 to Dr. Barry Dienes.

## 2015-01-16 ENCOUNTER — Ambulatory Visit (HOSPITAL_BASED_OUTPATIENT_CLINIC_OR_DEPARTMENT_OTHER): Payer: Medicare Other | Admitting: Oncology

## 2015-01-16 ENCOUNTER — Telehealth: Payer: Self-pay | Admitting: Oncology

## 2015-01-16 VITALS — BP 131/70 | HR 92 | Temp 97.9°F | Resp 17 | Ht 66.0 in | Wt 214.8 lb

## 2015-01-16 DIAGNOSIS — Z85048 Personal history of other malignant neoplasm of rectum, rectosigmoid junction, and anus: Secondary | ICD-10-CM | POA: Diagnosis not present

## 2015-01-16 DIAGNOSIS — C20 Malignant neoplasm of rectum: Secondary | ICD-10-CM

## 2015-01-16 NOTE — Progress Notes (Signed)
  [facility] OFFICE PROGRESS NOTE   Diagnosis: Rectal cancer  INTERVAL HISTORY:   [name] returns as scheduled. She has stable exertional dyspnea. She has been diagnosed with gallbladder disease and is scheduled for a cholecystectomy next week.  She underwent a CT of the abdomen and pelvis on 11/16/2014. The liver appeared normal. Minimal gallbladder wall thickening and pericholecystic stranding.. Stable presacral soft tissue thickening.  Objective:  Vital signs in last 24 hours:  Blood pressure 131/70, pulse 92, temperature 97.9 F (36.6 C), temperature source Oral, resp. rate 17, height 5\' 6"  (1.676 m), weight 214 lb 12.8 oz (97.433 kg), SpO2 97 %.    HEENT: Neck without mass Lymphatics: No cervical, supra-clavicular, or axillary nodes Resp: Scattered end inspiratory rhonchi and expiratory wheeze, no respiratory distress Cardio: Regular rate and rhythm GI: No hepatomegaly, nontender, no mass Vascular: No leg edema a  Lab Results:  Lab Results  Component Value Date   WBC 5.1 01/13/2015   HGB 12.8 01/13/2015   HCT 39.5 01/13/2015   MCV 89.4 01/13/2015   PLT 159 01/13/2015   NEUTROABS 3.5 01/13/2015   Lab Results  Component Value Date   CEA <0.5 01/14/2015     Medications: I have reviewed the patient's current medications.  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 Ileostomy takedown 04/02/2012  3. COPD  4. admission with a partial small bowel obstruction November 2014, resolved with bowel rest  5. aortic and mitral valve replacements 12/12/2013    Disposition:  [name] remains in clinical remission from rectal cancer. She will return for an office visit and CEA in 9 months. She is scheduled for a cholecystectomy next week.  Diane Coder, MD  01/16/2015  9:04 AM

## 2015-01-16 NOTE — Telephone Encounter (Signed)
gave and printed appt sched and avs for pt for Jan 2017

## 2015-01-19 ENCOUNTER — Other Ambulatory Visit: Payer: Self-pay | Admitting: Family Medicine

## 2015-01-20 ENCOUNTER — Encounter (HOSPITAL_COMMUNITY)
Admission: RE | Admit: 2015-01-20 | Discharge: 2015-01-20 | Disposition: A | Discharge status: Home / Self Care | Payer: Self-pay | Source: Ambulatory Visit | Attending: Internal Medicine | Admitting: Internal Medicine

## 2015-01-20 MED ORDER — CEFAZOLIN SODIUM-DEXTROSE 2-3 GM-% IV SOLR
2.0000 g | INTRAVENOUS | Status: AC
  Administered 2015-01-21: 2 g via INTRAVENOUS

## 2015-01-21 ENCOUNTER — Encounter (HOSPITAL_COMMUNITY): Payer: Self-pay | Admitting: *Deleted

## 2015-01-21 ENCOUNTER — Ambulatory Visit (HOSPITAL_COMMUNITY): Payer: Medicare Other | Admitting: Vascular Surgery

## 2015-01-21 ENCOUNTER — Ambulatory Visit (HOSPITAL_COMMUNITY)
Admission: RE | Admit: 2015-01-21 | Discharge: 2015-01-25 | Disposition: A | Discharge status: Home / Self Care | Payer: Medicare Other | Source: Ambulatory Visit | Attending: General Surgery | Admitting: General Surgery

## 2015-01-21 ENCOUNTER — Encounter (HOSPITAL_COMMUNITY)
Admission: RE | Disposition: A | Discharge status: Home / Self Care | Payer: Self-pay | Source: Ambulatory Visit | Attending: General Surgery

## 2015-01-21 ENCOUNTER — Ambulatory Visit (HOSPITAL_COMMUNITY): Payer: Medicare Other | Admitting: Certified Registered"

## 2015-01-21 ENCOUNTER — Ambulatory Visit (HOSPITAL_COMMUNITY): Payer: Medicare Other

## 2015-01-21 DIAGNOSIS — I509 Heart failure, unspecified: Secondary | ICD-10-CM | POA: Insufficient documentation

## 2015-01-21 DIAGNOSIS — K811 Chronic cholecystitis: Secondary | ICD-10-CM | POA: Diagnosis present

## 2015-01-21 DIAGNOSIS — K8010 Calculus of gallbladder with chronic cholecystitis without obstruction: Secondary | ICD-10-CM | POA: Diagnosis not present

## 2015-01-21 DIAGNOSIS — G4730 Sleep apnea, unspecified: Secondary | ICD-10-CM | POA: Diagnosis not present

## 2015-01-21 DIAGNOSIS — J45909 Unspecified asthma, uncomplicated: Secondary | ICD-10-CM | POA: Insufficient documentation

## 2015-01-21 DIAGNOSIS — N189 Chronic kidney disease, unspecified: Secondary | ICD-10-CM | POA: Insufficient documentation

## 2015-01-21 DIAGNOSIS — R112 Nausea with vomiting, unspecified: Secondary | ICD-10-CM | POA: Insufficient documentation

## 2015-01-21 DIAGNOSIS — N179 Acute kidney failure, unspecified: Secondary | ICD-10-CM | POA: Diagnosis not present

## 2015-01-21 DIAGNOSIS — C20 Malignant neoplasm of rectum: Secondary | ICD-10-CM | POA: Insufficient documentation

## 2015-01-21 DIAGNOSIS — J449 Chronic obstructive pulmonary disease, unspecified: Secondary | ICD-10-CM | POA: Insufficient documentation

## 2015-01-21 DIAGNOSIS — Z79899 Other long term (current) drug therapy: Secondary | ICD-10-CM | POA: Diagnosis not present

## 2015-01-21 DIAGNOSIS — K219 Gastro-esophageal reflux disease without esophagitis: Secondary | ICD-10-CM | POA: Diagnosis not present

## 2015-01-21 DIAGNOSIS — Z87891 Personal history of nicotine dependence: Secondary | ICD-10-CM | POA: Diagnosis not present

## 2015-01-21 DIAGNOSIS — Z7982 Long term (current) use of aspirin: Secondary | ICD-10-CM | POA: Diagnosis not present

## 2015-01-21 DIAGNOSIS — Z952 Presence of prosthetic heart valve: Secondary | ICD-10-CM | POA: Diagnosis not present

## 2015-01-21 DIAGNOSIS — T888XXA Other specified complications of surgical and medical care, not elsewhere classified, initial encounter: Secondary | ICD-10-CM

## 2015-01-21 HISTORY — PX: CHOLECYSTECTOMY: SHX55

## 2015-01-21 HISTORY — DX: Calculus of gallbladder with chronic cholecystitis without obstruction: K80.10

## 2015-01-21 LAB — CREATININE, SERUM
Creatinine, Ser: 1.33 mg/dL — ABNORMAL HIGH (ref 0.50–1.10)
GFR calc Af Amer: 46 mL/min — ABNORMAL LOW (ref >90)
GFR calc non Af Amer: 40 mL/min — ABNORMAL LOW (ref >90)

## 2015-01-21 LAB — CBC
HCT: 39 % (ref 36.0–46.0)
Hemoglobin: 12.4 g/dL (ref 12.0–15.0)
MCH: 29 pg (ref 26.0–34.0)
MCHC: 31.8 g/dL (ref 30.0–36.0)
MCV: 91.3 fL (ref 78.0–100.0)
Platelets: 163 10*3/uL (ref 150–400)
RBC: 4.27 MIL/uL (ref 3.87–5.11)
RDW: 14.9 % (ref 11.5–15.5)
WBC: 9 10*3/uL (ref 4.0–10.5)

## 2015-01-21 SURGERY — LAPAROSCOPIC CHOLECYSTECTOMY WITH INTRAOPERATIVE CHOLANGIOGRAM
Anesthesia: General | Site: Abdomen

## 2015-01-21 MED ORDER — DEXAMETHASONE SODIUM PHOSPHATE 4 MG/ML IJ SOLN
INTRAMUSCULAR | Status: AC
  Filled 2015-01-21: qty 1

## 2015-01-21 MED ORDER — ALBUTEROL SULFATE (2.5 MG/3ML) 0.083% IN NEBU: 2.5000 mg | INHALATION_SOLUTION | Freq: Four times a day (QID) | RESPIRATORY_TRACT | Status: DC | PRN

## 2015-01-21 MED ORDER — PROPOFOL 10 MG/ML IV BOLUS
INTRAVENOUS | Status: AC
  Filled 2015-01-21: qty 20

## 2015-01-21 MED ORDER — SODIUM CHLORIDE 0.9 % IJ SOLN
INTRAMUSCULAR | Status: AC
  Filled 2015-01-21: qty 10

## 2015-01-21 MED ORDER — POLYETHYLENE GLYCOL 3350 17 G PO PACK
17.0000 g | PACK | Freq: Every day | ORAL | Status: DC
  Administered 2015-01-22 – 2015-01-24 (×2): 17 g via ORAL
  Filled 2015-01-21 (×4): qty 1

## 2015-01-21 MED ORDER — KETOROLAC TROMETHAMINE 15 MG/ML IJ SOLN
15.0000 mg | Freq: Four times a day (QID) | INTRAMUSCULAR | Status: AC
  Administered 2015-01-21: 15 mg via INTRAVENOUS
  Filled 2015-01-21: qty 1

## 2015-01-21 MED ORDER — PRAVASTATIN SODIUM 10 MG PO TABS
20.0000 mg | ORAL_TABLET | Freq: Every day | ORAL | Status: DC
  Administered 2015-01-21 – 2015-01-22 (×2): 20 mg via ORAL
  Filled 2015-01-21 (×2): qty 2

## 2015-01-21 MED ORDER — OXYCODONE HCL 5 MG PO TABS
5.0000 mg | ORAL_TABLET | ORAL | Status: DC | PRN
Start: 2015-01-21 — End: 2015-01-25
  Administered 2015-01-21 – 2015-01-24 (×8): 10 mg via ORAL
  Filled 2015-01-21 (×3): qty 2

## 2015-01-21 MED ORDER — ONDANSETRON HCL 4 MG PO TABS
4.0000 mg | ORAL_TABLET | Freq: Four times a day (QID) | ORAL | Status: DC | PRN
  Administered 2015-01-23: 4 mg via ORAL
  Filled 2015-01-21: qty 1

## 2015-01-21 MED ORDER — FENTANYL CITRATE 0.05 MG/ML IJ SOLN
INTRAMUSCULAR | Status: AC
  Filled 2015-01-21: qty 5

## 2015-01-21 MED ORDER — ONDANSETRON HCL 4 MG/2ML IJ SOLN
INTRAMUSCULAR | Status: AC
  Filled 2015-01-21: qty 2

## 2015-01-21 MED ORDER — 0.9 % SODIUM CHLORIDE (POUR BTL) OPTIME
TOPICAL | Status: DC | PRN
  Administered 2015-01-21: 1000 mL

## 2015-01-21 MED ORDER — ROCURONIUM BROMIDE 50 MG/5ML IV SOLN
INTRAVENOUS | Status: AC
  Filled 2015-01-21: qty 1

## 2015-01-21 MED ORDER — METOCLOPRAMIDE HCL 5 MG/ML IJ SOLN
INTRAMUSCULAR | Status: DC | PRN
  Administered 2015-01-21: 5 mg via INTRAVENOUS

## 2015-01-21 MED ORDER — ACETAMINOPHEN 325 MG PO TABS
650.0000 mg | ORAL_TABLET | ORAL | Status: DC | PRN
Start: 2015-01-21 — End: 2015-01-25

## 2015-01-21 MED ORDER — IPRATROPIUM-ALBUTEROL 0.5-2.5 (3) MG/3ML IN SOLN: 3.0000 mL | Freq: Four times a day (QID) | RESPIRATORY_TRACT | Status: DC | PRN

## 2015-01-21 MED ORDER — GLYCOPYRROLATE 0.2 MG/ML IJ SOLN
INTRAMUSCULAR | Status: AC
  Filled 2015-01-21: qty 3

## 2015-01-21 MED ORDER — SODIUM CHLORIDE 0.9 % IR SOLN
Status: DC | PRN
  Administered 2015-01-21: 1000 mL

## 2015-01-21 MED ORDER — ALBUTEROL SULFATE HFA 108 (90 BASE) MCG/ACT IN AERS
INHALATION_SPRAY | RESPIRATORY_TRACT | Status: DC | PRN
  Administered 2015-01-21: 4 via RESPIRATORY_TRACT

## 2015-01-21 MED ORDER — BUPIVACAINE-EPINEPHRINE (PF) 0.25% -1:200000 IJ SOLN
INTRAMUSCULAR | Status: AC
  Filled 2015-01-21: qty 30

## 2015-01-21 MED ORDER — PANTOPRAZOLE SODIUM 40 MG PO TBEC
40.0000 mg | DELAYED_RELEASE_TABLET | Freq: Every day | ORAL | Status: DC
  Administered 2015-01-21 – 2015-01-24 (×4): 40 mg via ORAL
  Filled 2015-01-21 (×4): qty 1

## 2015-01-21 MED ORDER — GLYCOPYRROLATE 0.2 MG/ML IJ SOLN
INTRAMUSCULAR | Status: AC
  Filled 2015-01-21: qty 2

## 2015-01-21 MED ORDER — METOCLOPRAMIDE HCL 5 MG/ML IJ SOLN
INTRAMUSCULAR | Status: AC
  Filled 2015-01-21: qty 2

## 2015-01-21 MED ORDER — KETOROLAC TROMETHAMINE 15 MG/ML IJ SOLN
15.0000 mg | Freq: Four times a day (QID) | INTRAMUSCULAR | Status: DC | PRN
  Administered 2015-01-22 (×2): 15 mg via INTRAVENOUS
  Filled 2015-01-21 (×2): qty 1

## 2015-01-21 MED ORDER — STERILE WATER FOR INJECTION IJ SOLN
INTRAMUSCULAR | Status: AC
  Filled 2015-01-21: qty 10

## 2015-01-21 MED ORDER — HYDROMORPHONE HCL 1 MG/ML IJ SOLN
0.2500 mg | INTRAMUSCULAR | Status: DC | PRN
  Administered 2015-01-21: 0.5 mg via INTRAVENOUS

## 2015-01-21 MED ORDER — ENOXAPARIN SODIUM 40 MG/0.4ML ~~LOC~~ SOLN
40.0000 mg | SUBCUTANEOUS | Status: DC
  Administered 2015-01-22 – 2015-01-25 (×4): 40 mg via SUBCUTANEOUS
  Filled 2015-01-21 (×4): qty 0.4

## 2015-01-21 MED ORDER — ACETAMINOPHEN 650 MG RE SUPP: 650.0000 mg | RECTAL | Status: DC | PRN

## 2015-01-21 MED ORDER — TIOTROPIUM BROMIDE MONOHYDRATE 18 MCG IN CAPS
18.0000 ug | ORAL_CAPSULE | Freq: Every day | RESPIRATORY_TRACT | Status: DC
Start: 2015-01-21 — End: 2015-01-25
  Administered 2015-01-22 – 2015-01-25 (×4): 18 ug via RESPIRATORY_TRACT
  Filled 2015-01-21: qty 5

## 2015-01-21 MED ORDER — ROCURONIUM BROMIDE 100 MG/10ML IV SOLN
INTRAVENOUS | Status: DC | PRN
  Administered 2015-01-21: 40 mg via INTRAVENOUS
  Administered 2015-01-21: 10 mg via INTRAVENOUS

## 2015-01-21 MED ORDER — HYDROMORPHONE HCL 1 MG/ML IJ SOLN
1.0000 mg | INTRAMUSCULAR | Status: DC | PRN
  Administered 2015-01-23: 1 mg via INTRAVENOUS
  Filled 2015-01-21: qty 1

## 2015-01-21 MED ORDER — HYDROMORPHONE HCL 1 MG/ML IJ SOLN
1.0000 mg | INTRAMUSCULAR | Status: AC
  Administered 2015-01-21: 1 mg via INTRAVENOUS

## 2015-01-21 MED ORDER — MIDAZOLAM HCL 2 MG/2ML IJ SOLN
INTRAMUSCULAR | Status: AC
  Filled 2015-01-21: qty 2

## 2015-01-21 MED ORDER — ONDANSETRON HCL 4 MG/2ML IJ SOLN
4.0000 mg | Freq: Four times a day (QID) | INTRAMUSCULAR | Status: DC | PRN
  Administered 2015-01-22: 4 mg via INTRAVENOUS
  Filled 2015-01-21: qty 2

## 2015-01-21 MED ORDER — ONDANSETRON HCL 4 MG/2ML IJ SOLN
INTRAMUSCULAR | Status: DC | PRN
  Administered 2015-01-21 (×2): 4 mg via INTRAVENOUS

## 2015-01-21 MED ORDER — KCL IN DEXTROSE-NACL 20-5-0.45 MEQ/L-%-% IV SOLN
INTRAVENOUS | Status: DC
  Administered 2015-01-21 – 2015-01-22 (×2): via INTRAVENOUS
  Filled 2015-01-21 (×4): qty 1000

## 2015-01-21 MED ORDER — SODIUM CHLORIDE 0.9 % IV SOLN: 250.0000 mL | INTRAVENOUS | Status: DC | PRN

## 2015-01-21 MED ORDER — SODIUM CHLORIDE 0.9 % IJ SOLN
3.0000 mL | Freq: Two times a day (BID) | INTRAMUSCULAR | Status: DC
  Administered 2015-01-22: 3 mL via INTRAVENOUS

## 2015-01-21 MED ORDER — LIDOCAINE HCL (CARDIAC) 20 MG/ML IV SOLN
INTRAVENOUS | Status: DC | PRN
  Administered 2015-01-21: 60 mg via INTRATRACHEAL
  Administered 2015-01-21: 100 mg via INTRAVENOUS

## 2015-01-21 MED ORDER — SODIUM CHLORIDE 0.9 % IV SOLN
INTRAVENOUS | Status: DC | PRN
  Administered 2015-01-21: 16 mL

## 2015-01-21 MED ORDER — HYDROMORPHONE HCL 1 MG/ML IJ SOLN
INTRAMUSCULAR | Status: AC
  Administered 2015-01-21: 1 mg via INTRAVENOUS
  Filled 2015-01-21: qty 1

## 2015-01-21 MED ORDER — FENTANYL CITRATE 0.05 MG/ML IJ SOLN
INTRAMUSCULAR | Status: DC | PRN
  Administered 2015-01-21 (×2): 50 ug via INTRAVENOUS
  Administered 2015-01-21: 25 ug via INTRAVENOUS
  Administered 2015-01-21: 50 ug via INTRAVENOUS

## 2015-01-21 MED ORDER — EPHEDRINE SULFATE 50 MG/ML IJ SOLN
INTRAMUSCULAR | Status: AC
  Filled 2015-01-21: qty 1

## 2015-01-21 MED ORDER — HYDROMORPHONE HCL 1 MG/ML IJ SOLN
INTRAMUSCULAR | Status: AC
  Administered 2015-01-21: 1 mg
  Filled 2015-01-21: qty 1

## 2015-01-21 MED ORDER — POTASSIUM CHLORIDE CRYS ER 20 MEQ PO TBCR
20.0000 meq | EXTENDED_RELEASE_TABLET | Freq: Every day | ORAL | Status: DC
  Administered 2015-01-22 – 2015-01-24 (×3): 20 meq via ORAL
  Filled 2015-01-21 (×4): qty 1

## 2015-01-21 MED ORDER — ACETAMINOPHEN 500 MG PO TABS
1000.0000 mg | ORAL_TABLET | Freq: Four times a day (QID) | ORAL | Status: AC
  Administered 2015-01-21 – 2015-01-22 (×4): 1000 mg via ORAL
  Filled 2015-01-21 (×4): qty 2

## 2015-01-21 MED ORDER — PROPOFOL 10 MG/ML IV BOLUS
INTRAVENOUS | Status: DC | PRN
  Administered 2015-01-21: 150 mg via INTRAVENOUS

## 2015-01-21 MED ORDER — CEFAZOLIN SODIUM 1-5 GM-% IV SOLN
1.0000 g | Freq: Four times a day (QID) | INTRAVENOUS | Status: AC
  Administered 2015-01-21 – 2015-01-22 (×3): 1 g via INTRAVENOUS
  Filled 2015-01-21 (×3): qty 50

## 2015-01-21 MED ORDER — NEOSTIGMINE METHYLSULFATE 10 MG/10ML IV SOLN
INTRAVENOUS | Status: AC
  Filled 2015-01-21: qty 1

## 2015-01-21 MED ORDER — SODIUM CHLORIDE 0.9 % IJ SOLN: 3.0000 mL | INTRAMUSCULAR | Status: DC | PRN

## 2015-01-21 MED ORDER — NEOSTIGMINE METHYLSULFATE 10 MG/10ML IV SOLN
INTRAVENOUS | Status: DC | PRN
  Administered 2015-01-21: 4 mg via INTRAVENOUS

## 2015-01-21 MED ORDER — GLYCOPYRROLATE 0.2 MG/ML IJ SOLN
INTRAMUSCULAR | Status: DC | PRN
  Administered 2015-01-21: 0.2 mg via INTRAVENOUS
  Administered 2015-01-21: 0.6 mg via INTRAVENOUS

## 2015-01-21 MED ORDER — MIDAZOLAM HCL 5 MG/5ML IJ SOLN
INTRAMUSCULAR | Status: DC | PRN
  Administered 2015-01-21: 2 mg via INTRAVENOUS

## 2015-01-21 MED ORDER — OXYCODONE HCL 5 MG PO TABS
5.0000 mg | ORAL_TABLET | ORAL | Status: DC | PRN
  Filled 2015-01-21 (×6): qty 2

## 2015-01-21 MED ORDER — DEXAMETHASONE SODIUM PHOSPHATE 4 MG/ML IJ SOLN
INTRAMUSCULAR | Status: DC | PRN
  Administered 2015-01-21: 4 mg via INTRAVENOUS

## 2015-01-21 MED ORDER — MONTELUKAST SODIUM 10 MG PO TABS
10.0000 mg | ORAL_TABLET | Freq: Every day | ORAL | Status: DC
  Administered 2015-01-21 – 2015-01-24 (×4): 10 mg via ORAL
  Filled 2015-01-21 (×4): qty 1

## 2015-01-21 MED ORDER — HYDROMORPHONE HCL 1 MG/ML IJ SOLN
INTRAMUSCULAR | Status: AC
  Filled 2015-01-21: qty 1

## 2015-01-21 MED ORDER — LIDOCAINE HCL 1 % IJ SOLN
INTRAMUSCULAR | Status: DC | PRN
  Administered 2015-01-21: 14 mL via INTRAMUSCULAR

## 2015-01-21 MED ORDER — LACTATED RINGERS IV SOLN
INTRAVENOUS | Status: DC
  Administered 2015-01-21 (×3): via INTRAVENOUS

## 2015-01-21 MED ORDER — OXYCODONE-ACETAMINOPHEN 5-325 MG PO TABS: 1.0000 | ORAL_TABLET | ORAL | Status: DC | PRN

## 2015-01-21 MED ORDER — SUCCINYLCHOLINE CHLORIDE 20 MG/ML IJ SOLN
INTRAMUSCULAR | Status: AC
  Filled 2015-01-21: qty 1

## 2015-01-21 MED ORDER — LIDOCAINE HCL (PF) 1 % IJ SOLN
INTRAMUSCULAR | Status: AC
  Filled 2015-01-21: qty 30

## 2015-01-21 MED ORDER — FUROSEMIDE 40 MG PO TABS
40.0000 mg | ORAL_TABLET | Freq: Every day | ORAL | Status: DC
  Administered 2015-01-22 – 2015-01-24 (×3): 40 mg via ORAL
  Filled 2015-01-21 (×4): qty 1

## 2015-01-21 MED ORDER — LIDOCAINE HCL (CARDIAC) 20 MG/ML IV SOLN
INTRAVENOUS | Status: AC
  Filled 2015-01-21: qty 10

## 2015-01-21 MED ORDER — ARTIFICIAL TEARS OP OINT
TOPICAL_OINTMENT | OPHTHALMIC | Status: AC
  Filled 2015-01-21: qty 3.5

## 2015-01-21 MED ORDER — TRAMADOL HCL 50 MG PO TABS
50.0000 mg | ORAL_TABLET | Freq: Four times a day (QID) | ORAL | Status: DC | PRN
  Administered 2015-01-22: 50 mg via ORAL
  Filled 2015-01-21: qty 1

## 2015-01-21 MED ORDER — FLUTICASONE FUROATE-VILANTEROL 100-25 MCG/INH IN AEPB: 1.0000 | INHALATION_SPRAY | Freq: Two times a day (BID) | RESPIRATORY_TRACT | Status: DC

## 2015-01-21 MED ORDER — PHENYLEPHRINE HCL 10 MG/ML IJ SOLN
10.0000 mg | INTRAVENOUS | Status: DC | PRN
  Administered 2015-01-21: 20 ug/min via INTRAVENOUS

## 2015-01-21 MED ORDER — PROMETHAZINE HCL 25 MG/ML IJ SOLN: 6.2500 mg | INTRAMUSCULAR | Status: DC | PRN

## 2015-01-21 SURGICAL SUPPLY — 48 items
APPLIER CLIP ROT 10 11.4 M/L (STAPLE) ×2
BLADE SURG ROTATE 9660 (MISCELLANEOUS) IMPLANT
CANISTER SUCTION 2500CC (MISCELLANEOUS) ×2 IMPLANT
CHLORAPREP W/TINT 26ML (MISCELLANEOUS) ×2 IMPLANT
CLIP APPLIE ROT 10 11.4 M/L (STAPLE) ×1 IMPLANT
COVER MAYO STAND STRL (DRAPES) ×2 IMPLANT
COVER SURGICAL LIGHT HANDLE (MISCELLANEOUS) ×2 IMPLANT
DRAPE C-ARM 42X72 X-RAY (DRAPES) ×2 IMPLANT
DRAPE LAPAROSCOPIC ABDOMINAL (DRAPES) ×2 IMPLANT
DRAPE WARM FLUID 44X44 (DRAPE) ×2 IMPLANT
ELECT CAUTERY BLADE 6.4 (BLADE) ×2 IMPLANT
ELECT REM PT RETURN 9FT ADLT (ELECTROSURGICAL) ×2
ELECTRODE REM PT RTRN 9FT ADLT (ELECTROSURGICAL) ×1 IMPLANT
FILTER SMOKE EVAC LAPAROSHD (FILTER) ×2 IMPLANT
GLOVE BIO SURGEON STRL SZ 6 (GLOVE) ×2 IMPLANT
GLOVE BIO SURGEON STRL SZ 6.5 (GLOVE) ×2 IMPLANT
GLOVE BIO SURGEON STRL SZ7 (GLOVE) ×2 IMPLANT
GLOVE BIOGEL PI IND STRL 6.5 (GLOVE) ×1 IMPLANT
GLOVE BIOGEL PI IND STRL 7.0 (GLOVE) ×2 IMPLANT
GLOVE BIOGEL PI IND STRL 7.5 (GLOVE) ×2 IMPLANT
GLOVE BIOGEL PI INDICATOR 6.5 (GLOVE) ×1
GLOVE BIOGEL PI INDICATOR 7.0 (GLOVE) ×2
GLOVE BIOGEL PI INDICATOR 7.5 (GLOVE) ×2
GLOVE ECLIPSE 7.5 STRL STRAW (GLOVE) ×2 IMPLANT
GOWN STRL REUS W/ TWL LRG LVL3 (GOWN DISPOSABLE) ×4 IMPLANT
GOWN STRL REUS W/TWL 2XL LVL3 (GOWN DISPOSABLE) ×2 IMPLANT
GOWN STRL REUS W/TWL LRG LVL3 (GOWN DISPOSABLE) ×4
KIT BASIN OR (CUSTOM PROCEDURE TRAY) ×2 IMPLANT
KIT ROOM TURNOVER OR (KITS) ×2 IMPLANT
LAPAROSCOPIC ELECTRODE PTFE COATED WIRE L HOOK, BLACK EXTENDED INSULATION 33CM LENGTH ×2 IMPLANT
LIQUID BAND (GAUZE/BANDAGES/DRESSINGS) ×2 IMPLANT
NS IRRIG 1000ML POUR BTL (IV SOLUTION) ×2 IMPLANT
PAD ARMBOARD 7.5X6 YLW CONV (MISCELLANEOUS) ×2 IMPLANT
PENCIL BUTTON HOLSTER BLD 10FT (ELECTRODE) ×2 IMPLANT
POUCH SPECIMEN RETRIEVAL 10MM (ENDOMECHANICALS) ×2 IMPLANT
SCISSORS LAP 5X35 DISP (ENDOMECHANICALS) ×2 IMPLANT
SET CHOLANGIOGRAPH 5 50 .035 (SET/KITS/TRAYS/PACK) ×2 IMPLANT
SET IRRIG TUBING LAPAROSCOPIC (IRRIGATION / IRRIGATOR) ×2 IMPLANT
SLEEVE ENDOPATH XCEL 5M (ENDOMECHANICALS) ×4 IMPLANT
SPECIMEN JAR SMALL (MISCELLANEOUS) ×2 IMPLANT
SUT MNCRL AB 4-0 PS2 18 (SUTURE) ×4 IMPLANT
TOWEL OR 17X24 6PK STRL BLUE (TOWEL DISPOSABLE) ×2 IMPLANT
TOWEL OR 17X26 10 PK STRL BLUE (TOWEL DISPOSABLE) ×2 IMPLANT
TRAY LAPAROSCOPIC (CUSTOM PROCEDURE TRAY) ×2 IMPLANT
TROCAR XCEL BLUNT TIP 100MML (ENDOMECHANICALS) ×2 IMPLANT
TROCAR XCEL NON-BLD 11X100MML (ENDOMECHANICALS) ×2 IMPLANT
TROCAR XCEL NON-BLD 5MMX100MML (ENDOMECHANICALS) ×2 IMPLANT
TUBING INSUFFLATION (TUBING) ×2 IMPLANT

## 2015-01-21 NOTE — Discharge Instructions (Signed)
[facility] ______[facility] °LAPAROSCOPIC SURGERY: POST OP INSTRUCTIONS °Always review your discharge instruction sheet given to you by the facility where your surgery was performed. °IF YOU HAVE DISABILITY OR FAMILY LEAVE FORMS, YOU MUST BRING THEM TO THE OFFICE FOR PROCESSING.   °DO NOT GIVE THEM TO YOUR DOCTOR. ° °1. A prescription for pain medication may be given to you upon discharge.  Take your pain medication as prescribed, if needed.  If narcotic pain medicine is not needed, then you may take acetaminophen (Tylenol) or ibuprofen (Advil) as needed. °2. Take your usually prescribed medications unless otherwise directed. °3. If you need a refill on your pain medication, please contact your pharmacy.  They will contact our office to request authorization. Prescriptions will not be filled after 5pm or on week-ends. °4. You should follow a light diet the first few days after arrival home, such as soup and crackers, etc.  Be sure to include lots of fluids daily. °5. Most patients will experience some swelling and bruising in the area of the incisions.  Ice packs will help.  Swelling and bruising can take several days to resolve.  °6. It is common to experience some constipation if taking pain medication after surgery.  Increasing fluid intake and taking a stool softener (such as Colace) will usually help or prevent this problem from occurring.  A mild laxative (Milk of Magnesia or Miralax) should be taken according to package instructions if there are no bowel movements after 48 hours. °7. Unless discharge instructions indicate otherwise, you may remove your bandages 24-48 hours after surgery, and you may shower at that time.  You may have steri-strips (small skin tapes) in place directly over the incision.  These strips should be left on the skin for 7-10 days.  If your surgeon used skin glue on the incision, you may shower in 24 hours.  The glue will flake off over the next 2-3 weeks.  Any sutures or  staples will be removed at the office during your follow-up visit. °8. ACTIVITIES:  You may resume regular (light) daily activities beginning the next day--such as daily self-care, walking, climbing stairs--gradually increasing activities as tolerated.  You may have sexual intercourse when it is comfortable.  Refrain from any heavy lifting or straining until approved by your doctor. °a. You may drive when you are no longer taking prescription pain medication, you can comfortably wear a seatbelt, and you can safely maneuver your car and apply brakes. °b. RETURN TO WORK:  __________________________________________________________ °9. You should see your doctor in the office for a follow-up appointment approximately 2-3 weeks after your surgery.  Make sure that you call for this appointment within a day or two after you arrive home to insure a convenient appointment time. °10. OTHER INSTRUCTIONS: __________________________________________________________________________________________________________________________ __________________________________________________________________________________________________________________________ °WHEN TO CALL YOUR DOCTOR: °1. Fever over 101.0 °2. Inability to urinate °3. Continued bleeding from incision. °4. Increased pain, redness, or drainage from the incision. °5. Increasing abdominal pain ° °The clinic staff is available to answer your questions during regular business hours.  Please don’t hesitate to call and ask to speak to one of the nurses for clinical concerns.  If you have a medical emergency, go to the nearest emergency room or call 911.  A surgeon from [facility] is always on call at the hospital. °[address], [address], [place], [place]  [address] ? [address] [address], [place], [place]   [address] °[phone] ? [phone] ? FAX [phone] °Web site:   [email or web address] °

## 2015-01-21 NOTE — Anesthesia Postprocedure Evaluation (Signed)
  Anesthesia Post-op Note  Patient: [name]  Procedure(s) Performed: Procedure(s): LAPAROSCOPIC CHOLECYSTECTOMY WITH INTRAOPERATIVE CHOLANGIOGRAM (N/A)  Patient Location: PACU  Anesthesia Type:General  Level of Consciousness: awake  Airway and Oxygen Therapy: Patient Spontanous Breathing  Post-op Pain: mild  Post-op Assessment: Post-op Vital signs reviewed  Post-op Vital Signs: Reviewed  Last Vitals:  Filed Vitals:   01/21/15 1052  BP: 131/65  Pulse: 71  Temp: 36.9 C  Resp:     Complications: No apparent anesthesia complications

## 2015-01-21 NOTE — Anesthesia Postprocedure Evaluation (Signed)
  Anesthesia Post-op Note  Patient: [name]  Procedure(s) Performed: Procedure(s): LAPAROSCOPIC CHOLECYSTECTOMY WITH INTRAOPERATIVE CHOLANGIOGRAM (N/A)  Patient Location: PACU  Anesthesia Type:General  Level of Consciousness: awake  Airway and Oxygen Therapy: Patient Spontanous Breathing  Post-op Pain: mild  Post-op Assessment: Post-op Vital signs reviewed  Post-op Vital Signs: Reviewed  Last Vitals:  Filed Vitals:   01/21/15 1242  BP: 133/59  Pulse: 72  Temp: 36.7 C  Resp: 16    Complications: No apparent anesthesia complications

## 2015-01-21 NOTE — Progress Notes (Signed)
Report given to [place]. Pain now 10/10 upper bilateral quadrants. Dr Barry Dienes was called, continue with Dilaudid. Pt to be transferred.

## 2015-01-21 NOTE — Op Note (Signed)
Laparoscopic Cholecystectomy with IOC Procedure Note  Indications: This patient presents with chronic cholecystitis, history of elevated LFTs and will undergo laparoscopic cholecystectomy.  Pre-operative Diagnosis: see above  Post-operative Diagnosis: Same  Surgeon: Stark Klein   Assistants: Ariel Hilsinger, PA-S  Anesthesia: General endotracheal anesthesia and local  ASA Class: 3  Procedure Details  The patient was seen again in the Holding Room. The risks, benefits, complications, treatment options, and expected outcomes were discussed with the patient. The possibilities of  bleeding, recurrent infection, damage to nearby structures, the need for additional procedures, failure to diagnose a condition, the possible need to convert to an open procedure, and creating a complication requiring transfusion or operation were discussed with the patient. The likelihood of improving the patient's symptoms with return to their baseline status is good.    The patient and/or family concurred with the proposed plan, giving informed consent. The site of surgery properly noted. The patient was taken to Operating Room, and the procedure verified as Laparoscopic Cholecystectomy with Intraoperative Cholangiogram. A Time Out was held and the above information confirmed.  Prior to the induction of general anesthesia, antibiotic prophylaxis was administered. General endotracheal anesthesia was then administered and tolerated well. After the induction, the abdomen was prepped with Chloraprep and draped in the sterile fashion. The patient was positioned in the supine position.  Local anesthetic agent was injected into the skin near the umbilicus and an incision made. We dissected down to the abdominal fascia with blunt dissection.  The fascia was incised vertically and we entered the peritoneal cavity bluntly.  A pursestring suture of 0-Vicryl was placed around the fascial opening.  The Hasson cannula was inserted  and secured with the stay suture.  Pneumoperitoneum was then created with CO2 and tolerated well without any adverse changes in the patient's vital signs.   The patient was seen to have many adhesions in the mid abdomen and upper abdomen.  Four 5 mm ports were required in the right abdomen to take these down.  The adhesions of the bowel and the omentum to the abdominal wall were all taken down sharply.  This was done painstakingly slowly in order to avoid adhesions.  Once all the adhesions to the abdominal wall were taken down, the case was able to proceed.    An 11-mm port was placed in the subxiphoid position.  All skin incisions were infiltrated with a local anesthetic agent before making the incision and placing the trocars.   We positioned the patient in reverse Trendelenburg, tilted slightly to the patient's left.  The gallbladder was identified, the fundus grasped and retracted cephalad. There were many adhesions of the duodenum to the infundibulum of the gallbladderAdhesions were lysed bluntly and with the electrocautery where indicated, taking care not to injure any adjacent organs or viscus. The infundibulum was grasped and retracted laterally, exposing the peritoneum overlying the triangle of Calot. This was then divided and exposed in a blunt fashion. A critical view of the cystic duct and cystic artery was obtained.  The cystic duct was clearly identified and bluntly dissected circumferentially. The cystic duct was ligated with a clip distally.   An incision was made in the cystic duct and the [facility] cholangiogram catheter introduced. The catheter was secured using a clip. A cholangiogram was then performed, demonstrating filling of the common bile duct, the duodenum, and the left and right hepatic ducts.    The cystic duct was then ligated with clips and divided. The cystic artery was identified, dissected  free, ligated with clips and divided as well.   The gallbladder was dissected from the  liver bed in retrograde fashion with the electrocautery. The gallbladder was removed and placed in an Endocatch bag.  The gallbladder and Endocatch bag were then removed through the umbilical port site.  The liver bed was irrigated and inspected. Hemostasis was achieved with the electrocautery. Copious irrigation was utilized and was repeatedly aspirated until clear.    We again inspected the right upper quadrant for hemostasis.  The lower abdomen was examined the site of adhesiolysis.  There was no evidence of bowel injury.  There was no succus in the abdomen.   Pneumoperitoneum was released as we removed the trocars.   The pursestring suture was used to close the umbilical fascia.  4-0 Monocryl was used to close the skin.   The skin was cleaned and dry, and Dermabond was applied. The patient was then extubated and brought to the recovery room in stable condition. Instrument, sponge, and needle counts were correct at closure and at the conclusion of the case.   Findings: Significant adhesions of the gallbladder to the duodenum and many adhesions to the abdominal wall.    Estimated Blood Loss: min         Drains: none          Specimens: Gallbladder to pathology       Complications: None; patient tolerated the procedure well.         Disposition: PACU - hemodynamically stable.         Condition: stable

## 2015-01-21 NOTE — Progress Notes (Signed)
Prescriptions given to husband to be filled prior to discharge.

## 2015-01-21 NOTE — H&P (Signed)
[name] Location: [facility] Patient #: [id number] DOB: [birth year] Married / Language: English / Race: White Female  History of Present Illness The patient is a 69 year old female who presents for evaluation of gall stones. Patient is well know to me as a rectal cancer patient. She presents wtih several attacks of severe upper abdominal/RUQ pain. She underwent ultrasound which showed sludge. She did have some increase in her LFTs (alk phos, alt, ast). HIDA was abnormal with 0% gallbladder EF and symptom replication with CCK infusion. She does not report fever/chills/jaundice. She did have nausea with symptoms. It does seem that symptoms followed larger meals/fatty meals like the super bowl. She has tried to adjust her diet and has not noted chronic pain. She has had an additional few episodes.    Other Problems  Hypercholesterolemia High blood pressure Hemorrhoids PRIMARY CANCER OF RECTUM (154.1  C20) Rectal Cancer Migraine Headache Back Pain Asthma Arthritis Heart murmur Congestive Heart Failure Chronic Obstructive Lung Disease  Past Surgical History  Spinal Surgery - Lower Back Tonsillectomy Valve Replacement Colon Removal - Partial Foot Surgery Bilateral. Resection of Small Bowel  Diagnostic Studies History Mammogram within last year Colonoscopy 1-5 years ago  Allergies  Sulfa Antibiotics PredniSONE (Pak) *CORTICOSTEROIDS* Codeine Phosphate *ANALGESICS - OPIOID*  Medication History  Calcium Carbonate (600MG  Tablet, 1 Oral daily) Active. TraMADol HCl (50MG  Tablet, 1-2 Oral prn pain) Active. MiraLax (once Oral daily) Active. Aspirin (81MG  Capsule, 1 Oral daily) Active. Breo Ellipta (100-25MCG/INH Aero Pow Br Act, Inhalation) Active. Furosemide (40MG  Tablet, Oral) Active. Lovastatin (40MG  Tablet, Oral) Active. Montelukast Sodium (10MG  Tablet, Oral) Active. Pantoprazole Sodium (40MG  Tablet DR, Oral)  Active. Potassium Chloride Crys ER (10MEQ Tablet ER, Oral) Active. Spiriva HandiHaler (18MCG Capsule, Inhalation) Active. Potassium Chloride ER (10MEQ Tablet ER, Oral) Active. Fluconazole (100MG  Tablet, Oral) Active.  Social History  No drug use Tobacco use Former smoker. Alcohol use Occasional alcohol use. Caffeine use Carbonated beverages, Tea.  Family History Heart disease in female family member before age 40 Hypertension Mother. Respiratory Condition Father. Arthritis Mother. Cancer Father. Heart Disease Mother.  Pregnancy / Birth History Gravida 2 Maternal age 59-25 Para 2 Age at menarche 45 years. Age of menopause 7-60 Contraceptive History Oral contraceptives.  Review of Systems  All other systems negative   Vitals  Wt Readings from Last 3 Encounters:  01/21/15 97.07 kg (214 lb)  01/16/15 97.433 kg (214 lb 12.8 oz)  01/13/15 99.2 kg (218 lb 11.1 oz)   Temp Readings from Last 3 Encounters:  01/21/15 97.3 F (36.3 C) Oral  01/16/15 97.9 F (36.6 C) Oral  01/13/15 97.9 F (36.6 C)    BP Readings from Last 3 Encounters:  01/21/15 114/57  01/16/15 131/70  01/13/15 119/56   Pulse Readings from Last 3 Encounters:  01/21/15 78  01/16/15 92  01/13/15 88   Physical Exam General Mental Status-Alert. General Appearance-Consistent with stated age. Hydration-Well hydrated. Voice-Normal.  Head and Neck Head-normocephalic, atraumatic with no lesions or palpable masses.  Eye Sclera/Conjunctiva - Bilateral-No scleral icterus.  Chest and Lung Exam Chest and lung exam reveals -quiet, even and easy respiratory effort with no use of accessory muscles. Inspection Chest Wall - Normal. Back - normal.  Breast - Did not examine.  Cardiovascular Cardiovascular examination reveals -normal pedal pulses bilaterally. Note: regular rate and rhythm  Abdomen Inspection-Inspection Normal. Palpation/Percussion Palpation and  Percussion of the abdomen reveal - Soft, No Rebound tenderness, No Rigidity (guarding) and No hepatosplenomegaly. Tenderness - Note: mild  to moderate RUQ tenderness.  Peripheral Vascular Upper Extremity Inspection - Bilateral - Normal - No Clubbing, No Cyanosis, No Edema, Pulses Intact. Lower Extremity Palpation - Edema - Bilateral - No edema.  Neurologic Neurologic evaluation reveals -alert and oriented x 3 with no impairment of recent or remote memory. Mental Status-Normal.  Musculoskeletal Global Assessment -Note: no gross deformities.  Normal Exam - Left-Upper Extremity Strength Normal and Lower Extremity Strength Normal. Normal Exam - Right-Upper Extremity Strength Normal and Lower Extremity Strength Normal.  Lymphatic Head & Neck  General Head & Neck Lymphatics: Bilateral - Description - Normal. Axillary  General Axillary Region: Bilateral - Description - Normal. Tenderness - Non Tender.    Assessment & Plan  CHRONIC CHOLECYSTITIS WITHOUT CALCULUS (575.11  K81.1) Impression: Patient does have evidence for chronic cholecystitis. Since she has tenderness present over her gallbladder, I do not recommend trying to put off surgery with diet alteration.  The surgical procedure was described to the patient in detail. The patient was given educational material. I discussed the incision type and location, the location of the gallbladder, the anatomy of the bile ducts and arteries, and the typical progression of surgery. I discussed the possibility of converting to an open operation. I advised of the risks of bleeding, infection, damage to other structures (such as the bile duct, intestine or liver), bile leak, need for other procedures or surgeries, and post op diarrhea/constipation. We discussed the risk of blood clot. We discussed the recovery period and post operative restrictions. The patient was advised against taking blood thinners the week before surgery.   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 Current Plans  Schedule for Surgery Pt Education - Laparoscopic Cholecystectomy: gallbladder

## 2015-01-21 NOTE — Transfer of Care (Signed)
Immediate Anesthesia Transfer of Care Note  Patient: [name]  Procedure(s) Performed: Procedure(s): LAPAROSCOPIC CHOLECYSTECTOMY WITH INTRAOPERATIVE CHOLANGIOGRAM (N/A)  Patient Location: PACU  Anesthesia Type:General  Level of Consciousness: awake, alert  and oriented  Airway & Oxygen Therapy: Patient Spontanous Breathing and Patient connected to nasal cannula oxygen  Post-op Assessment: Report given to RN and Post -op Vital signs reviewed and stable  Post vital signs: Reviewed and stable  Last Vitals:  Filed Vitals:   01/21/15 0722  BP: 114/57  Pulse: 78  Temp: 36.3 C  Resp: 18    Complications: No apparent anesthesia complications

## 2015-01-21 NOTE — Progress Notes (Signed)
Pt c/o right shoulder pain with abdominal pain 10/10. Dr Barry Dienes was paged/ admit orders will be written,  Pt to receive Dilaudid 1mg  now.

## 2015-01-21 NOTE — Interval H&P Note (Signed)
History and Physical Interval Note:  01/21/2015 8:30 AM  [name]  has presented today for surgery, with the diagnosis of Chronic Cholecystitis  The various methods of treatment have been discussed with the patient and family. After consideration of risks, benefits and other options for treatment, the patient has consented to  Procedure(s): LAPAROSCOPIC CHOLECYSTECTOMY WITH INTRAOPERATIVE CHOLANGIOGRAM (N/A) as a surgical intervention .  The patient's history has been reviewed, patient examined, no change in status, stable for surgery.  I have reviewed the patient's chart and labs.  Questions were answered to the patient's satisfaction.     Yamina Lenis

## 2015-01-21 NOTE — Anesthesia Preprocedure Evaluation (Signed)
Anesthesia Evaluation  Patient identified by MRN, date of birth, ID band Patient awake    Reviewed: Allergy & Precautions, NPO status   History of Anesthesia Complications (+) PONV  Airway Mallampati: II  TM Distance: >3 FB Neck ROM: Full    Dental   Pulmonary shortness of breath, sleep apnea , pneumonia -, COPDformer smoker,  breath sounds clear to auscultation        Cardiovascular hypertension, Rhythm:Regular Rate:Normal     Neuro/Psych    GI/Hepatic GERD-  ,  Endo/Other    Renal/GU      Musculoskeletal   Abdominal   Peds  Hematology   Anesthesia Other Findings   Reproductive/Obstetrics                             Anesthesia Physical Anesthesia Plan  ASA: III  Anesthesia Plan:    Post-op Pain Management:    Induction: Intravenous  Airway Management Planned: Oral ETT  Additional Equipment:   Intra-op Plan:   Post-operative Plan: Extubation in OR  Informed Consent: I have reviewed the patients History and Physical, chart, labs and discussed the procedure including the risks, benefits and alternatives for the proposed anesthesia with the patient or authorized representative who has indicated his/her understanding and acceptance.   Dental advisory given  Plan Discussed with: CRNA and Anesthesiologist  Anesthesia Plan Comments:         Anesthesia Quick Evaluation

## 2015-01-21 NOTE — Anesthesia Procedure Notes (Signed)
Procedure Name: Intubation Date/Time: 01/21/2015 8:58 AM Performed by: [place] Pink Pre-anesthesia Checklist: Patient identified, Emergency Drugs available, Suction available, Patient being monitored and Timeout performed Patient Re-evaluated:Patient Re-evaluated prior to inductionOxygen Delivery Method: Circle system utilized Preoxygenation: Pre-oxygenation with 100% oxygen Intubation Type: IV induction Ventilation: Mask ventilation without difficulty Laryngoscope Size: Mac and 3 Grade View: Grade I Tube type: Oral Tube size: 7.0 mm Number of attempts: 1 Airway Equipment and Method: Stylet and LTA kit utilized Placement Confirmation: ETT inserted through vocal cords under direct vision,  positive ETCO2 and breath sounds checked- equal and bilateral Secured at: 20 cm Tube secured with: Tape Dental Injury: Teeth and Oropharynx as per pre-operative assessment

## 2015-01-21 NOTE — Progress Notes (Signed)
2 L o2 [place] added

## 2015-01-22 ENCOUNTER — Encounter (HOSPITAL_COMMUNITY): Payer: Self-pay

## 2015-01-22 DIAGNOSIS — K8010 Calculus of gallbladder with chronic cholecystitis without obstruction: Secondary | ICD-10-CM | POA: Diagnosis not present

## 2015-01-22 LAB — COMPREHENSIVE METABOLIC PANEL
ALT: 113 U/L — ABNORMAL HIGH (ref 0–35)
AST: 140 U/L — ABNORMAL HIGH (ref 0–37)
Albumin: 3.2 g/dL — ABNORMAL LOW (ref 3.5–5.2)
Alkaline Phosphatase: 124 U/L — ABNORMAL HIGH (ref 39–117)
Anion gap: 12 (ref 5–15)
BUN: 22 mg/dL (ref 6–23)
CO2: 25 mmol/L (ref 19–32)
Calcium: 8.9 mg/dL (ref 8.4–10.5)
Chloride: 100 mmol/L (ref 96–112)
Creatinine, Ser: 1.3 mg/dL — ABNORMAL HIGH (ref 0.50–1.10)
GFR calc Af Amer: 47 mL/min — ABNORMAL LOW (ref >90)
GFR calc non Af Amer: 41 mL/min — ABNORMAL LOW (ref >90)
Glucose, Bld: 119 mg/dL — ABNORMAL HIGH (ref 70–99)
Potassium: 4.8 mmol/L (ref 3.5–5.1)
Sodium: 137 mmol/L (ref 135–145)
Total Bilirubin: 1.1 mg/dL (ref 0.3–1.2)
Total Protein: 6.4 g/dL (ref 6.0–8.3)

## 2015-01-22 LAB — CBC
HCT: 38.9 % (ref 36.0–46.0)
Hemoglobin: 12.7 g/dL (ref 12.0–15.0)
MCH: 29.9 pg (ref 26.0–34.0)
MCHC: 32.6 g/dL (ref 30.0–36.0)
MCV: 91.5 fL (ref 78.0–100.0)
Platelets: 144 10*3/uL — ABNORMAL LOW (ref 150–400)
RBC: 4.25 MIL/uL (ref 3.87–5.11)
RDW: 15 % (ref 11.5–15.5)
WBC: 12.1 10*3/uL — ABNORMAL HIGH (ref 4.0–10.5)

## 2015-01-22 LAB — PHOSPHORUS: Phosphorus: 4.2 mg/dL (ref 2.3–4.6)

## 2015-01-22 LAB — MAGNESIUM: Magnesium: 2 mg/dL (ref 1.5–2.5)

## 2015-01-22 NOTE — Progress Notes (Signed)
1 Day Post-Op  Subjective: The patient is still having nausea and pain.  She is requiring IV medication.    Objective: Vital signs in last 24 hours: Temp:  [98.1 F (36.7 C)-98.2 F (36.8 C)] 98.1 F (36.7 C) (04/14 1412) Pulse Rate:  [68-85] 68 (04/14 1412) Resp:  [16-19] 16 (04/14 1412) BP: (109-143)/(50-65) 116/50 mmHg (04/14 1412) SpO2:  [93 %-100 %] 94 % (04/14 1412) Weight:  [97.07 kg (214 lb)] 97.07 kg (214 lb) (04/13 2045) Last BM Date: 01/21/15  Intake/Output from previous day: 04/13 0701 - 04/14 0700 In: 2370 [P.O.:120; I.V.:2250] Out: 430 [Urine:150; Blood:30] Intake/Output this shift: Total I/O In: 1080 [P.O.:480; I.V.:600] Out: 600 [Urine:600]  General appearance: alert, cooperative and mild distress Resp: breathing comfortably GI: soft, approp tender, non distended  Extremities: extremities normal, atraumatic, no cyanosis or edema  Lab Results:   Recent Labs  01/21/15 2034 01/22/15 0427  WBC 9.0 12.1*  HGB 12.4 12.7  HCT 39.0 38.9  PLT 163 144*   BMET  Recent Labs  01/21/15 2034 01/22/15 0427  NA  --  137  K  --  4.8  CL  --  100  CO2  --  25  GLUCOSE  --  119*  BUN  --  22  CREATININE 1.33* 1.30*  CALCIUM  --  8.9   PT/INR No results for input(s): LABPROT, INR in the last 72 hours. ABG No results for input(s): PHART, HCO3 in the last 72 hours.  Invalid input(s): PCO2, PO2  Studies/Results: Dg Cholangiogram Operative  01/21/2015   CLINICAL DATA:  69 year old female with cholelithiasis  EXAM: INTRAOPERATIVE CHOLANGIOGRAM  TECHNIQUE: Cholangiographic images from the C-arm fluoroscopic device were submitted for interpretation post-operatively. Please see the procedural report for the amount of contrast and the fluoroscopy time utilized.  COMPARISON:  CT 11/16/2014  FINDINGS: Fluoroscopic spot images during cholecystectomy.  Images demonstrate surgical instruments of the upper abdomen. Surgical clips at the inferior liver margin.  There is  cannulation of the cystic duct with antegrade flow of contrast.  Caliber of the extrahepatic biliary ductal system within normal limits. No large filling defect identified.  Contrast traverses the ampulla entering the duodenum.  IMPRESSION: Intraoperative cholangiogram demonstrates caliber of the extrahepatic ductal system within normal limits with no filling defects identified. Contrast traverses the ampulla.  Please refer to the dictated operative report for full details of intraoperative findings and procedure.  Signed,  Dulcy Fanny. Earleen Newport, DO  [facility]  [facility]   Electronically Signed   By: Corrie Mckusick D.O.   On: 01/21/2015 11:55    Anti-infectives: Anti-infectives    Start     Dose/Rate Route Frequency Ordered Stop   01/21/15 1730  ceFAZolin (ANCEF) IVPB 1 g/50 mL premix     1 g 100 mL/hr over 30 Minutes Intravenous Every 6 hours 01/21/15 1615 01/22/15 0619   01/21/15 0600  ceFAZolin (ANCEF) IVPB 2 g/50 mL premix     2 g 100 mL/hr over 30 Minutes Intravenous On call to O.R. 01/20/15 1350 01/21/15 0906      Assessment/Plan: s/p Procedure(s): LAPAROSCOPIC CHOLECYSTECTOMY WITH INTRAOPERATIVE CHOLANGIOGRAM (N/A) Keep in hospital another day for nausea/vomiting  Recheck labs in AM.   [facility] 01/22/2015

## 2015-01-23 ENCOUNTER — Ambulatory Visit (HOSPITAL_COMMUNITY): Payer: Medicare Other

## 2015-01-23 DIAGNOSIS — K8010 Calculus of gallbladder with chronic cholecystitis without obstruction: Secondary | ICD-10-CM | POA: Diagnosis not present

## 2015-01-23 LAB — COMPREHENSIVE METABOLIC PANEL
ALT: 103 U/L — ABNORMAL HIGH (ref 0–35)
AST: 123 U/L — ABNORMAL HIGH (ref 0–37)
Albumin: 3.1 g/dL — ABNORMAL LOW (ref 3.5–5.2)
Alkaline Phosphatase: 174 U/L — ABNORMAL HIGH (ref 39–117)
Anion gap: 11 (ref 5–15)
BUN: 17 mg/dL (ref 6–23)
CO2: 25 mmol/L (ref 19–32)
Calcium: 9 mg/dL (ref 8.4–10.5)
Chloride: 98 mmol/L (ref 96–112)
Creatinine, Ser: 1.18 mg/dL — ABNORMAL HIGH (ref 0.50–1.10)
GFR calc Af Amer: 53 mL/min — ABNORMAL LOW (ref >90)
GFR calc non Af Amer: 46 mL/min — ABNORMAL LOW (ref >90)
Glucose, Bld: 99 mg/dL (ref 70–99)
Potassium: 4.7 mmol/L (ref 3.5–5.1)
Sodium: 134 mmol/L — ABNORMAL LOW (ref 135–145)
Total Bilirubin: 6.2 mg/dL — ABNORMAL HIGH (ref 0.3–1.2)
Total Protein: 6.4 g/dL (ref 6.0–8.3)

## 2015-01-23 LAB — CBC
HCT: 35.8 % — ABNORMAL LOW (ref 36.0–46.0)
Hemoglobin: 11.3 g/dL — ABNORMAL LOW (ref 12.0–15.0)
MCH: 29 pg (ref 26.0–34.0)
MCHC: 31.6 g/dL (ref 30.0–36.0)
MCV: 91.8 fL (ref 78.0–100.0)
Platelets: 154 10*3/uL (ref 150–400)
RBC: 3.9 MIL/uL (ref 3.87–5.11)
RDW: 15.2 % (ref 11.5–15.5)
WBC: 8.2 10*3/uL (ref 4.0–10.5)

## 2015-01-23 MED ORDER — IOHEXOL 300 MG/ML  SOLN
25.0000 mL | INTRAMUSCULAR | Status: AC
  Administered 2015-01-23 (×2): 25 mL via ORAL

## 2015-01-23 MED ORDER — PROMETHAZINE HCL 25 MG/ML IJ SOLN
12.5000 mg | Freq: Four times a day (QID) | INTRAMUSCULAR | Status: DC | PRN
  Administered 2015-01-23: 12.5 mg via INTRAVENOUS
  Filled 2015-01-23: qty 1

## 2015-01-23 MED ORDER — PROMETHAZINE HCL 25 MG/ML IJ SOLN
12.5000 mg | Freq: Three times a day (TID) | INTRAMUSCULAR | Status: DC | PRN
  Administered 2015-01-24: 12.5 mg via INTRAVENOUS
  Filled 2015-01-23: qty 1

## 2015-01-23 NOTE — Progress Notes (Signed)
Patient ID: [name], female   DOB: [birth year], 69 y.o.   MRN: [id number] 2 Days Post-Op   Subjective: The patient is still having nausea and pain that seems a bit worse today.    Objective: Vital signs in last 24 hours: Temp:  [97.3 F (36.3 C)-98.4 F (36.9 C)] 98.4 F (36.9 C) (04/15 0559) Pulse Rate:  [68-84] 80 (04/15 0559) Resp:  [16-18] 17 (04/15 0559) BP: (112-137)/(50-60) 123/54 mmHg (04/15 0559) SpO2:  [91 %-94 %] 92 % (04/15 0559) Last BM Date: 01/21/15  Intake/Output from previous day: 04/14 0701 - 04/15 0700 In: 1540 [P.O.:940; I.V.:600] Out: 1200 [Urine:1200] Intake/Output this shift:    General appearance: alert, cooperative and mild distress Resp: breathing comfortably GI: soft, mildly distended, mildly tender.    Extremities: extremities normal, atraumatic, no cyanosis or edema  Lab Results:   Recent Labs  01/22/15 0427 01/23/15 0452  WBC 12.1* 8.2  HGB 12.7 11.3*  HCT 38.9 35.8*  PLT 144* 154   BMET  Recent Labs  01/22/15 0427 01/23/15 0452  NA 137 134*  K 4.8 4.7  CL 100 98  CO2 25 25  GLUCOSE 119* 99  BUN 22 17  CREATININE 1.30* 1.18*  CALCIUM 8.9 9.0   PT/INR No results for input(s): LABPROT, INR in the last 72 hours. ABG No results for input(s): PHART, HCO3 in the last 72 hours.  Invalid input(s): PCO2, PO2  Studies/Results: Dg Cholangiogram Operative  01/21/2015   CLINICAL DATA:  69 year old female with cholelithiasis  EXAM: INTRAOPERATIVE CHOLANGIOGRAM  TECHNIQUE: Cholangiographic images from the C-arm fluoroscopic device were submitted for interpretation post-operatively. Please see the procedural report for the amount of contrast and the fluoroscopy time utilized.  COMPARISON:  CT 11/16/2014  FINDINGS: Fluoroscopic spot images during cholecystectomy.  Images demonstrate surgical instruments of the upper abdomen. Surgical clips at the inferior liver margin.  There is cannulation of the cystic duct with antegrade flow of  contrast.  Caliber of the extrahepatic biliary ductal system within normal limits. No large filling defect identified.  Contrast traverses the ampulla entering the duodenum.  IMPRESSION: Intraoperative cholangiogram demonstrates caliber of the extrahepatic ductal system within normal limits with no filling defects identified. Contrast traverses the ampulla.  Please refer to the dictated operative report for full details of intraoperative findings and procedure.  Signed,  Dulcy Fanny. Earleen Newport, DO  [facility]  [facility]   Electronically Signed   By: Corrie Mckusick D.O.   On: 01/21/2015 11:55    Anti-infectives: Anti-infectives    Start     Dose/Rate Route Frequency Ordered Stop   01/21/15 1730  ceFAZolin (ANCEF) IVPB 1 g/50 mL premix     1 g 100 mL/hr over 30 Minutes Intravenous Every 6 hours 01/21/15 1615 01/22/15 0619   01/21/15 0600  ceFAZolin (ANCEF) IVPB 2 g/50 mL premix     2 g 100 mL/hr over 30 Minutes Intravenous On call to O.R. 01/20/15 1350 01/21/15 0906      Assessment/Plan: s/p Procedure(s): LAPAROSCOPIC CHOLECYSTECTOMY WITH INTRAOPERATIVE CHOLANGIOGRAM (N/A) Patient clearly not doing well enough today to go home.   Check CT today since patient has hyperbilirubinemia.  Patient had significant lysis of adhesions and worry about possible bowel injury.  Also, will get good look at RUQ for possible bowel leak.     [facility] 01/23/2015

## 2015-01-24 DIAGNOSIS — K8010 Calculus of gallbladder with chronic cholecystitis without obstruction: Secondary | ICD-10-CM | POA: Diagnosis not present

## 2015-01-24 LAB — COMPREHENSIVE METABOLIC PANEL
ALT: 64 U/L — ABNORMAL HIGH (ref 0–35)
AST: 61 U/L — ABNORMAL HIGH (ref 0–37)
Albumin: 3 g/dL — ABNORMAL LOW (ref 3.5–5.2)
Alkaline Phosphatase: 165 U/L — ABNORMAL HIGH (ref 39–117)
Anion gap: 10 (ref 5–15)
BUN: 20 mg/dL (ref 6–23)
CO2: 25 mmol/L (ref 19–32)
Calcium: 9 mg/dL (ref 8.4–10.5)
Chloride: 97 mmol/L (ref 96–112)
Creatinine, Ser: 1.33 mg/dL — ABNORMAL HIGH (ref 0.50–1.10)
GFR calc Af Amer: 46 mL/min — ABNORMAL LOW (ref >90)
GFR calc non Af Amer: 40 mL/min — ABNORMAL LOW (ref >90)
Glucose, Bld: 91 mg/dL (ref 70–99)
Potassium: 4.7 mmol/L (ref 3.5–5.1)
Sodium: 132 mmol/L — ABNORMAL LOW (ref 135–145)
Total Bilirubin: 3.5 mg/dL — ABNORMAL HIGH (ref 0.3–1.2)
Total Protein: 6.3 g/dL (ref 6.0–8.3)

## 2015-01-24 NOTE — Progress Notes (Signed)
0600 Patient had her O2 off and O2sat was 85% on RA, O2 applied at 2l/m nasal cannula and O2 sats 95%. Patient then went to the bathroom and took O2 sats when she came back to her chair and her O2 sats 73 on RA. O2 applied via nasal cannula at 2l/m and O2 sats now 92%. Will continue to monitor.

## 2015-01-24 NOTE — Progress Notes (Signed)
O2 sat at 93% on RA during ambulation but complained of some SOB after ambulating 100 ft.

## 2015-01-24 NOTE — Progress Notes (Signed)
3 Days Post-Op  Subjective: Still having pain but less nauseated this am. CT shows only postoperative changes  Objective: Vital signs in last 24 hours: Temp:  [98.4 F (36.9 C)-99.5 F (37.5 C)] 99 F (37.2 C) (04/15 2208) Pulse Rate:  [80-94] 94 (04/15 2208) Resp:  [16-17] 16 (04/15 2208) BP: (108-123)/(48-54) 112/49 mmHg (04/15 2208) SpO2:  [90 %-94 %] 94 % (04/15 2208) Last BM Date: 01/21/15  Intake/Output from previous day:   Intake/Output this shift:    Resp: clear to auscultation bilaterally Cardio: regular rate and rhythm GI: soft, mild tenderness in RUQ. few bs  Lab Results:   Recent Labs  01/22/15 0427 01/23/15 0452  WBC 12.1* 8.2  HGB 12.7 11.3*  HCT 38.9 35.8*  PLT 144* 154   BMET  Recent Labs  01/22/15 0427 01/23/15 0452  NA 137 134*  K 4.8 4.7  CL 100 98  CO2 25 25  GLUCOSE 119* 99  BUN 22 17  CREATININE 1.30* 1.18*  CALCIUM 8.9 9.0   PT/INR No results for input(s): LABPROT, INR in the last 72 hours. ABG No results for input(s): PHART, HCO3 in the last 72 hours.  Invalid input(s): PCO2, PO2  Studies/Results: Ct Abdomen Pelvis Wo Contrast  01/23/2015   CLINICAL DATA:  Cholecystectomy 01/21/2015 now with fluid collection at surgical site and right abdominal pain with nausea. Prior bowel resection and rectal cancer.  EXAM: CT ABDOMEN AND PELVIS WITHOUT CONTRAST  TECHNIQUE: Multidetector CT imaging of the abdomen and pelvis was performed following the standard protocol without IV contrast.  COMPARISON:  11/16/2014  FINDINGS: Lower chest: Median sternotomy wires are partly visualized. Moderate cardiomegaly noted. Curvilinear bilateral lower lobe atelectasis is present. Small right and trace left pleural effusions are present.  Hepatobiliary: No focal hepatic abnormality. Trace perihepatic ascites is present. Minimal fluid within the gallbladder fossa with cholecystectomy clips in place.  Pancreas: Normal  Spleen: Normal  Adrenals/Urinary Tract:  Adrenal glands appear normal. Too small to characterize right mid renal cortical hypodense lesion image 36 is reidentified, previously demonstrated to represent a cyst. No hydroureteronephrosis or radiopaque renal or ureteral calculus.  Stomach/Bowel: Evidence of distal colectomy with mild presacral soft tissue planes thickening and stranding most compatible with radiation. No bowel wall thickening or focal segmental dilatation. Appendix presumed surgically absent.  Vascular/Lymphatic: Moderate atheromatous aortic calcification without aneurysm. No lymphadenopathy.  Other: Uterus and adnexa appear unremarkable. A trace amount of free pelvic fluid is present.  No free air. Postsurgical change noted within the abdominal wall but no subcutaneous fluid collection is identified.  Musculoskeletal: No acute osseous abnormality. Posterior lumbar fusion hardware noted spanning L4-L5. Multilevel disc degenerative change with Schmorl's node formation. Minimal superior endplate compression deformity at L1 is reidentified and stable.  IMPRESSION: Minimal fluid within the gallbladder fossa compatible with recent postsurgical change. No subcutaneous fluid collection or free intra-abdominal air.  No acute intra-abdominal or pelvic pathology otherwise.   Electronically Signed   By: Conchita Paris [name]   On: 01/23/2015 15:22    Anti-infectives: Anti-infectives    Start     Dose/Rate Route Frequency Ordered Stop   01/21/15 1730  ceFAZolin (ANCEF) IVPB 1 g/50 mL premix     1 g 100 mL/hr over 30 Minutes Intravenous Every 6 hours 01/21/15 1615 01/22/15 0619   01/21/15 0600  ceFAZolin (ANCEF) IVPB 2 g/50 mL premix     2 g 100 mL/hr over 30 Minutes Intravenous On call to O.R. 01/20/15 1350 01/21/15 [id number]  Assessment/Plan: s/p Procedure(s): LAPAROSCOPIC CHOLECYSTECTOMY WITH INTRAOPERATIVE CHOLANGIOGRAM (N/A) will continue with full liquids until bowel function begins to return  Recheck lft's today ambulate      TOTH III,PAUL S 01/24/2015

## 2015-01-25 DIAGNOSIS — K8010 Calculus of gallbladder with chronic cholecystitis without obstruction: Secondary | ICD-10-CM | POA: Diagnosis not present

## 2015-01-25 LAB — COMPREHENSIVE METABOLIC PANEL
ALT: 40 U/L — ABNORMAL HIGH (ref 0–35)
AST: 35 U/L (ref 0–37)
Albumin: 2.7 g/dL — ABNORMAL LOW (ref 3.5–5.2)
Alkaline Phosphatase: 133 U/L — ABNORMAL HIGH (ref 39–117)
Anion gap: 9 (ref 5–15)
BUN: 17 mg/dL (ref 6–23)
CO2: 29 mmol/L (ref 19–32)
Calcium: 8.8 mg/dL (ref 8.4–10.5)
Chloride: 96 mmol/L (ref 96–112)
Creatinine, Ser: 1.22 mg/dL — ABNORMAL HIGH (ref 0.50–1.10)
GFR calc Af Amer: 51 mL/min — ABNORMAL LOW (ref >90)
GFR calc non Af Amer: 44 mL/min — ABNORMAL LOW (ref >90)
Glucose, Bld: 106 mg/dL — ABNORMAL HIGH (ref 70–99)
Potassium: 4.2 mmol/L (ref 3.5–5.1)
Sodium: 134 mmol/L — ABNORMAL LOW (ref 135–145)
Total Bilirubin: 1.7 mg/dL — ABNORMAL HIGH (ref 0.3–1.2)
Total Protein: 6 g/dL (ref 6.0–8.3)

## 2015-01-25 MED ORDER — OXYCODONE HCL 5 MG PO TABS: 5.0000 mg | ORAL_TABLET | Freq: Four times a day (QID) | ORAL | Status: DC | PRN

## 2015-01-25 NOTE — Discharge Summary (Signed)
Physician Discharge Summary  [name] [facility]:[id number] DOB: 1945/12/02 DOA: 01/21/2015  PCP: Ria Bush, MD  Consultation: none  Admit date: 01/21/2015 Discharge date: 01/25/2015  Recommendations for Outpatient Follow-up:   Follow-up Information    Follow up with [facility], MD In 3 weeks.   Specialty:  General Surgery   Contact information:   [address] [place] [address] [phone]      Discharge Diagnoses:  1. S/p laparoscopic cholecystectomy 2. Acute on chronic CKD 3. Abnormal LFTs   Surgical Procedure: laparoscopic cholecystectomy with IOC---Dr. Barry Dienes  Discharge Condition: stable Disposition: home  Diet recommendation: regular  Filed Weights   01/21/15 0722 01/21/15 2045  Weight: 97.07 kg (214 lb) 97.07 kg (214 lb)    Filed Vitals:   01/25/15 0552  BP: 132/66  Pulse: 64  Temp: 97.7 F (36.5 C)  Resp: 18      Hospital Course:  The patient underwent a scheduled cholecystectomy for chronic cholecystitis.  She tolerated the procedure well and was transferred to the floor.  She had post op nausea and vomiting. Had abnormal LFTs but a follow up CT of abdomen was unremarkable.  She had LOA and small bowel injury was ruled out.  Follow up LFTs were trending down.  Mild increase in sCr from baseline of 1.2.  i discouraged use of NSAIDs.  On POD#4 the patient was having BMs, tolerating a diet, stable labs and afebrile.  She was therefore felt stable for discharge. Medication risks, benefits and therapeutic alternatives were reviewed with the patient.  She verbalizes understanding. Follow up in 2-3 weeks.     Discharge Instructions     Medication List    TAKE these medications        albuterol 108 (90 BASE) MCG/ACT inhaler  Commonly known as:  PROVENTIL HFA;VENTOLIN HFA  Inhale 2 puffs into the lungs every 6 (six) hours as needed for wheezing.     aspirin 81 MG tablet  Take 81 mg by mouth at bedtime.     BIOTIN PO  Take  1 tablet by mouth at bedtime.     BREO ELLIPTA 100-25 MCG/INH Aepb  Generic drug:  Fluticasone Furoate-Vilanterol  inhale 1 puff by mouth once daily     CALCIUM 600 + D PO  Take 1,200 mg by mouth at bedtime.     furosemide 40 MG tablet  Commonly known as:  LASIX  Take 1 tablet (40 mg total) by mouth daily.     ipratropium-albuterol 0.5-2.5 (3) MG/3ML Soln  Commonly known as:  DUONEB  Take 3 mLs by nebulization every 6 (six) hours as needed.     lovastatin 40 MG tablet  Commonly known as:  MEVACOR  take 1 tablet by mouth at bedtime     Miconazole Nitrate 2 % Powd  1 application by Does not apply route daily as needed (for skin). Apply to stomach and under breasts     montelukast 10 MG tablet  Commonly known as:  SINGULAIR  Take 1 tablet (10 mg total) by mouth at bedtime.     oxyCODONE 5 MG immediate release tablet  Commonly known as:  Oxy IR/ROXICODONE  Take 1-2 tablets (5-10 mg total) by mouth every 6 (six) hours as needed for moderate pain, severe pain or breakthrough pain.     oxyCODONE-acetaminophen 5-325 MG per tablet  Commonly known as:  ROXICET  Take 1-2 tablets by mouth every 4 (four) hours as needed for severe pain.  pantoprazole 40 MG tablet  Commonly known as:  PROTONIX  Take 1 tablet (40 mg total) by mouth at bedtime.     polyethylene glycol packet  Commonly known as:  MIRALAX / GLYCOLAX  Take 17 g by mouth daily.     potassium chloride 10 MEQ tablet  Commonly known as:  K-DUR,KLOR-CON  Take 2 tablets (20 mEq total) by mouth daily.     SPIRIVA HANDIHALER 18 MCG inhalation capsule  Generic drug:  tiotropium  inhale the contents of one capsule in the handihaler once daily     traMADol 50 MG tablet  Commonly known as:  ULTRAM  Take 50 mg by mouth every 6 (six) hours as needed for moderate pain.     Vitamin D 2000 UNITS Caps  Take 1 capsule by mouth at bedtime.           Follow-up Information    Follow up with [facility], MD In 3 weeks.    Specialty:  General Surgery   Contact information:   [address] [place] [address] [phone]        The results of significant diagnostics from this hospitalization (including imaging, microbiology, ancillary and laboratory) are listed below for reference.    Significant Diagnostic Studies: Ct Abdomen Pelvis Wo Contrast  01/23/2015   CLINICAL DATA:  Cholecystectomy 01/21/2015 now with fluid collection at surgical site and right abdominal pain with nausea. Prior bowel resection and rectal cancer.  EXAM: CT ABDOMEN AND PELVIS WITHOUT CONTRAST  TECHNIQUE: Multidetector CT imaging of the abdomen and pelvis was performed following the standard protocol without IV contrast.  COMPARISON:  11/16/2014  FINDINGS: Lower chest: Median sternotomy wires are partly visualized. Moderate cardiomegaly noted. Curvilinear bilateral lower lobe atelectasis is present. Small right and trace left pleural effusions are present.  Hepatobiliary: No focal hepatic abnormality. Trace perihepatic ascites is present. Minimal fluid within the gallbladder fossa with cholecystectomy clips in place.  Pancreas: Normal  Spleen: Normal  Adrenals/Urinary Tract: Adrenal glands appear normal. Too small to characterize right mid renal cortical hypodense lesion image 36 is reidentified, previously demonstrated to represent a cyst. No hydroureteronephrosis or radiopaque renal or ureteral calculus.  Stomach/Bowel: Evidence of distal colectomy with mild presacral soft tissue planes thickening and stranding most compatible with radiation. No bowel wall thickening or focal segmental dilatation. Appendix presumed surgically absent.  Vascular/Lymphatic: Moderate atheromatous aortic calcification without aneurysm. No lymphadenopathy.  Other: Uterus and adnexa appear unremarkable. A trace amount of free pelvic fluid is present.  No free air. Postsurgical change noted within the abdominal wall but no subcutaneous fluid collection is  identified.  Musculoskeletal: No acute osseous abnormality. Posterior lumbar fusion hardware noted spanning L4-L5. Multilevel disc degenerative change with Schmorl's node formation. Minimal superior endplate compression deformity at L1 is reidentified and stable.  IMPRESSION: Minimal fluid within the gallbladder fossa compatible with recent postsurgical change. No subcutaneous fluid collection or free intra-abdominal air.  No acute intra-abdominal or pelvic pathology otherwise.   Electronically Signed   By: Conchita Paris [name]   On: 01/23/2015 15:22   Dg Cholangiogram Operative  01/21/2015   CLINICAL DATA:  69 year old female with cholelithiasis  EXAM: INTRAOPERATIVE CHOLANGIOGRAM  TECHNIQUE: Cholangiographic images from the C-arm fluoroscopic device were submitted for interpretation post-operatively. Please see the procedural report for the amount of contrast and the fluoroscopy time utilized.  COMPARISON:  CT 11/16/2014  FINDINGS: Fluoroscopic spot images during cholecystectomy.  Images demonstrate surgical instruments of the upper abdomen. Surgical clips  at the inferior liver margin.  There is cannulation of the cystic duct with antegrade flow of contrast.  Caliber of the extrahepatic biliary ductal system within normal limits. No large filling defect identified.  Contrast traverses the ampulla entering the duodenum.  IMPRESSION: Intraoperative cholangiogram demonstrates caliber of the extrahepatic ductal system within normal limits with no filling defects identified. Contrast traverses the ampulla.  Please refer to the dictated operative report for full details of intraoperative findings and procedure.  Signed,  Dulcy Fanny. Earleen Newport, DO  [facility]  [facility]   Electronically Signed   By: Corrie Mckusick D.O.   On: 01/21/2015 11:55   Dg Bone Density  01/11/2015   Date of study: 01/06/15 Exam: DUAL X-RAY ABSORPTIOMETRY (DXA) FOR BONE MINERAL DENSITY (BMD)  Instrument: [employer] Requesting Provider: PCP Indication: follow up for low BMD Comparison: none (please note that it is not possible to compare data from  different instruments) Clinical data: Pt is a postmenopausal 69 y.o. female with no previous h/o  fracture. On calcium and vitamin D.  Results:  Lumbar spine (L1-L4) Femoral neck (FN) 33% distal radius  T-score 0.4 RFN: -1.4 LFN:-1.6 n/a  Change in BMD from previous DXA test (%) n/a n/a n/a  (*) statistically significant  Assessment: the BMD is low according to the [facility] classification for  osteoporosis (see below). Fracture risk: moderate FRAX score: 10 year major osteoporotic risk: 9.6%. 10 year hip fracture  risk: 1.4%. These are under the thresholds for treatment of 20% and 3%,  respectively. Comments: the technical quality of the study is good.(L4 was excluded due  to surgical change) Evaluation for secondary causes should be considered if clinically  indicated.  Recommend optimizing calcium (1200 mg/day) and vitamin D (800 IU/day)  intake.  Followup: Repeat BMD is appropriate after 2 years or after 1-2 years if  starting treatment.  WHO criteria for diagnosis of osteoporosis in postmenopausal women and in  men 45 y/o or older:  - normal: T-score -1.0 to + 1.0 - osteopenia/low bone density: T-score between -2.5 and -1.0 - osteoporosis: T-score below -2.5 - severe osteoporosis: T-score below -2.5 with history of fragility  fracture Note: although not part of the WHO classification, the presence of a  fragility fracture, regardless of the T-score, should be considered  diagnostic of osteoporosis, provided other causes for the fracture have  been excluded.  Treatment: The National Osteoporosis Foundation recommends that treatment  be considered in postmenopausal women and men age 33 or older with: 1. Hip or vertebral (clinical or morphometric) fracture 2. T-score of - 2.5 or lower at the spine or hip 3. 10-year fracture probability by FRAX of at least  20% for a major  osteoporotic fracture and 3% for a hip fracture  Loura Pardon MD       Microbiology: No results found for this or any previous visit (from the past 240 hour(s)).   Labs: Basic Metabolic Panel:  Recent Labs Lab 01/21/15 2034 01/22/15 0427 01/23/15 0452 01/24/15 0507 01/25/15 0850  NA  --  137 134* 132* 134*  K  --  4.8 4.7 4.7 4.2  CL  --  100 98 97 96  CO2  --  25 25 25 29   GLUCOSE  --  119* 99 91 106*  BUN  --  22 17 20 17   CREATININE 1.33* 1.30* 1.18* 1.33* 1.22*  CALCIUM  --  8.9 9.0 9.0 8.8  MG  --  2.0  --   --   --  PHOS  --  4.2  --   --   --    Liver Function Tests:  Recent Labs Lab 01/22/15 0427 01/23/15 0452 01/24/15 0507 01/25/15 0850  AST 140* 123* 61* 35  ALT 113* 103* 64* 40*  ALKPHOS 124* 174* 165* 133*  BILITOT 1.1 6.2* 3.5* 1.7*  PROT 6.4 6.4 6.3 6.0  ALBUMIN 3.2* 3.1* 3.0* 2.7*   No results for input(s): LIPASE, AMYLASE in the last 168 hours. No results for input(s): AMMONIA in the last 168 hours. CBC:  Recent Labs Lab 01/21/15 2034 01/22/15 0427 01/23/15 0452  WBC 9.0 12.1* 8.2  HGB 12.4 12.7 11.3*  HCT 39.0 38.9 35.8*  MCV 91.3 91.5 91.8  PLT 163 144* 154   Cardiac Enzymes: No results for input(s): CKTOTAL, CKMB, CKMBINDEX, TROPONINI in the last 168 hours. BNP: BNP (last 3 results)  Recent Labs  11/16/14 1447  BNP 211.0*    ProBNP (last 3 results)  Recent Labs  08/25/14 1517  PROBNP 666.3*    CBG: No results for input(s): GLUCAP in the last 168 hours.  Active Problems:   Chronic cholecystitis with calculus   Signed:  Jaqualin Serpa, ANP-BC

## 2015-01-25 NOTE — Progress Notes (Signed)
[name] [place] to be D/C'd Home per MD order.  Discussed with the patient and all questions fully answered.  VSS, Surgical sites clean, dry, intact with no sign of infection.   IV catheter discontinued intact. Site without signs and symptoms of complications. Dressing and pressure applied.  An After Visit Summary was printed and given to the patient. Patient received prescription.  D/c education completed with patient/family including follow up instructions, medication list, d/c activities limitations if indicated, with other d/c instructions as indicated by MD - patient able to verbalize understanding, all questions fully answered.   Patient instructed to return to ED, call 911, or call MD for any changes in condition.   Patient escorted via [place], and D/C home via private auto.  Micki Riley 01/25/2015 10:56 AM

## 2015-01-25 NOTE — Progress Notes (Signed)
Patient ID: [name], female   DOB: [birth year], 69 y.o.   MRN: [id number]     [place]      [address] [place], [place] [address]    Phone: [phone] FAX: [phone]     Subjective: No dysuria.  Passing flatus. Tolerating POs.  Sore.  Ambulating.  VSS.  Afebrile.    Objective:  Vital signs:  Filed Vitals:   01/24/15 1215 01/24/15 1342 01/24/15 2220 01/25/15 0552  BP:  128/66 110/49 132/66  Pulse:  96 84 64  Temp:  98.9 F (37.2 C) 98.4 F (36.9 C) 97.7 F (36.5 C)  TempSrc:  Oral Oral Oral  Resp:  $Remo'18 18 18  'BQbsx$ Height:      Weight:      SpO2: 93% 94% 92% 96%    Last BM Date: 01/21/15  Intake/Output   Yesterday:  04/16 0701 - 04/17 0700 In: 480 [P.O.:480] Out: 2800 [Urine:2800] This shift:  Total I/O In: -  Out: 200 [Urine:200]   Physical Exam: General: Pt awake/alert/oriented x4 in no acute distress Chest: cta. No chest wall pain w good excursion CV:  Pulses intact.  Regular rhythm MS: Normal AROM mjr joints.  No obvious deformity Abdomen: Soft.  Nondistended.  Mildly tender at incisions only.  No evidence of peritonitis.  No incarcerated hernias. Ext:  SCDs BLE.  No mjr edema.  No cyanosis Skin: No petechiae / purpura   Problem List:   Active Problems:   Chronic cholecystitis with calculus    Results:   Labs: Results for orders placed or performed during the hospital encounter of 01/21/15 (from the past 48 hour(s))  Comprehensive metabolic panel     Status: Abnormal   Collection Time: 01/24/15  5:07 AM  Result Value Ref Range   Sodium 132 (L) 135 - 145 mmol/L   Potassium 4.7 3.5 - 5.1 mmol/L   Chloride 97 96 - 112 mmol/L   CO2 25 19 - 32 mmol/L   Glucose, Bld 91 70 - 99 mg/dL   BUN 20 6 - 23 mg/dL   Creatinine, Ser 1.33 (H) 0.50 - 1.10 mg/dL   Calcium 9.0 8.4 - 10.5 mg/dL   Total Protein 6.3 6.0 - 8.3 g/dL   Albumin 3.0 (L) 3.5 - 5.2 g/dL   AST 61 (H) 0 - 37 U/L   ALT 64 (H) 0 -  35 U/L   Alkaline Phosphatase 165 (H) 39 - 117 U/L   Total Bilirubin 3.5 (H) 0.3 - 1.2 mg/dL   GFR calc non Af Amer 40 (L) >90 mL/min   GFR calc Af Amer 46 (L) >90 mL/min    Comment: (NOTE) The eGFR has been calculated using the CKD EPI equation. This calculation has not been validated in all clinical situations. eGFR's persistently <90 mL/min signify possible Chronic Kidney Disease.    Anion gap 10 5 - 15   *Note: Due to a large number of results and/or encounters for the requested time period, some results have not been displayed. A complete set of results can be found in Results Review.    Imaging / Studies: Ct Abdomen Pelvis Wo Contrast  01/23/2015   CLINICAL DATA:  Cholecystectomy 01/21/2015 now with fluid collection at surgical site and right abdominal pain with nausea. Prior bowel resection and rectal cancer.  EXAM: CT ABDOMEN AND PELVIS WITHOUT CONTRAST  TECHNIQUE: Multidetector CT imaging of the abdomen and pelvis was performed following the standard protocol without IV  contrast.  COMPARISON:  11/16/2014  FINDINGS: Lower chest: Median sternotomy wires are partly visualized. Moderate cardiomegaly noted. Curvilinear bilateral lower lobe atelectasis is present. Small right and trace left pleural effusions are present.  Hepatobiliary: No focal hepatic abnormality. Trace perihepatic ascites is present. Minimal fluid within the gallbladder fossa with cholecystectomy clips in place.  Pancreas: Normal  Spleen: Normal  Adrenals/Urinary Tract: Adrenal glands appear normal. Too small to characterize right mid renal cortical hypodense lesion image 36 is reidentified, previously demonstrated to represent a cyst. No hydroureteronephrosis or radiopaque renal or ureteral calculus.  Stomach/Bowel: Evidence of distal colectomy with mild presacral soft tissue planes thickening and stranding most compatible with radiation. No bowel wall thickening or focal segmental dilatation. Appendix presumed surgically  absent.  Vascular/Lymphatic: Moderate atheromatous aortic calcification without aneurysm. No lymphadenopathy.  Other: Uterus and adnexa appear unremarkable. A trace amount of free pelvic fluid is present.  No free air. Postsurgical change noted within the abdominal wall but no subcutaneous fluid collection is identified.  Musculoskeletal: No acute osseous abnormality. Posterior lumbar fusion hardware noted spanning L4-L5. Multilevel disc degenerative change with Schmorl's node formation. Minimal superior endplate compression deformity at L1 is reidentified and stable.  IMPRESSION: Minimal fluid within the gallbladder fossa compatible with recent postsurgical change. No subcutaneous fluid collection or free intra-abdominal air.  No acute intra-abdominal or pelvic pathology otherwise.   Electronically Signed   By: Conchita Paris [name]   On: 01/23/2015 15:22    Medications / Allergies:  Scheduled Meds: . enoxaparin (LOVENOX) injection  40 mg Subcutaneous Q24H  . Fluticasone Furoate-Vilanterol  1 puff Nasal BID  . furosemide  40 mg Oral Daily  . montelukast  10 mg Oral QHS  . pantoprazole  40 mg Oral QHS  . polyethylene glycol  17 g Oral Daily  . potassium chloride  20 mEq Oral Daily  . tiotropium  18 mcg Inhalation Daily   Continuous Infusions:  PRN Meds:.acetaminophen **OR** acetaminophen, albuterol, HYDROmorphone (DILAUDID) injection, ipratropium-albuterol, ondansetron **OR** ondansetron (ZOFRAN) IV, oxyCODONE, oxyCODONE, promethazine, traMADol  Antibiotics: Anti-infectives    Start     Dose/Rate Route Frequency Ordered Stop   01/21/15 1730  ceFAZolin (ANCEF) IVPB 1 g/50 mL premix     1 g 100 mL/hr over 30 Minutes Intravenous Every 6 hours 01/21/15 1615 01/22/15 0619   01/21/15 0600  ceFAZolin (ANCEF) IVPB 2 g/50 mL premix     2 g 100 mL/hr over 30 Minutes Intravenous On call to O.R. 01/20/15 1350 01/21/15 0906        Assessment/Plan POD#4 laparoscopic cholecystectomy with IOC--Dr.  Barry Dienes -pain well controlled, tolerating POs, ambulating and passing flatus. -will repeat her LFTs, if trending down and sCr is stable, DC home CKD -baseline 1.2 -DC toradol and avoid NSAIDs  Erby Pian, ANP-BC [facility] Pager [phone](7A-4:30P) For consults and floor pages call [phone](7A-4:30P)  01/25/2015 8:07 AM

## 2015-01-27 ENCOUNTER — Encounter (HOSPITAL_COMMUNITY): Admission: RE | Admit: 2015-01-27 | Payer: Self-pay | Source: Ambulatory Visit

## 2015-01-28 ENCOUNTER — Encounter (HOSPITAL_COMMUNITY): Payer: Self-pay | Admitting: General Surgery

## 2015-01-29 ENCOUNTER — Ambulatory Visit
Admission: RE | Admit: 2015-01-29 | Discharge: 2015-01-29 | Disposition: A | Discharge status: Home / Self Care | Payer: Medicare Other | Source: Ambulatory Visit

## 2015-01-29 ENCOUNTER — Encounter (HOSPITAL_COMMUNITY): Payer: Medicare Other

## 2015-01-29 DIAGNOSIS — Z1231 Encounter for screening mammogram for malignant neoplasm of breast: Secondary | ICD-10-CM

## 2015-01-29 LAB — HM MAMMOGRAPHY: HM Mammogram: NORMAL

## 2015-01-30 ENCOUNTER — Encounter: Payer: Self-pay | Admitting: *Deleted

## 2015-02-03 ENCOUNTER — Encounter (HOSPITAL_COMMUNITY): Payer: Self-pay

## 2015-02-05 ENCOUNTER — Encounter (HOSPITAL_COMMUNITY): Payer: Self-pay

## 2015-02-06 ENCOUNTER — Encounter: Payer: Self-pay | Admitting: Family Medicine

## 2015-02-09 ENCOUNTER — Ambulatory Visit (INDEPENDENT_AMBULATORY_CARE_PROVIDER_SITE_OTHER): Payer: Medicare Other | Admitting: Internal Medicine

## 2015-02-09 ENCOUNTER — Encounter: Payer: Self-pay | Admitting: Internal Medicine

## 2015-02-09 VITALS — BP 138/90 | HR 76 | Ht 66.0 in | Wt 211.0 lb

## 2015-02-09 DIAGNOSIS — J441 Chronic obstructive pulmonary disease with (acute) exacerbation: Secondary | ICD-10-CM

## 2015-02-09 MED ORDER — METHYLPREDNISOLONE 8 MG PO TABS: ORAL_TABLET | ORAL | Status: DC

## 2015-02-09 MED ORDER — DOXYCYCLINE HYCLATE 100 MG PO TABS: ORAL_TABLET | ORAL | Status: DC

## 2015-02-09 NOTE — Progress Notes (Signed)
Subjective:    Patient ID: [name], female    DOB: [birth year], 69 y.o.   MRN: [id number]  HPI #Obese  Body mass index is 34.85 kg/(m^2). Body mass index is 35.98 kg/(m^2). - 05/03/2013 Body mass index is 35.25 kg/(m^2). on 06/25/2013 Body mass index is 34.07 kg/(m^2). 02/12/2015    #Ex 15 pack smoker. Quit 1970s  #COPD/ASthma - Baseline CAT score 18 in May 2013 (poor score is 4 sleep quality, level 3 for dyspnea/fatigue)  #Pulmonary nodules  -  Colon Cancer during cancer Rx had CT chest Oct 2012 and pulmonary nodules noted  - October 2012: There is an irregular nodule in the right upper lobe measuring 12 mm which is unchanged from prior and has adjacent bronchiectasis also unchanged. Within the left lower lobe, there is a 4 mm pulmonary nodule (image 30) which is unchanged.  - October 2013: No change on CT chest Oct 2013   #colon Cancer  - 2012 (early) - s/p surgery and chemo  #Arctic valve stenosis  0 Dr. Rollene Fare s/p surgery -> follows with Dr Orene Desanctis  #Exacerbations of obstructive lung disease   - November 2013: Treatment from office with Levaquin and Medrol Dosepak  - March 2-14: Prednisone opd for flare up  OV 02/09/2015  Chief Complaint  Patient presents with  . Follow-up    Pt stated she has been going to pulm rehab up until 2 weeks ago-pt had gallbladder removed. Pt stated her breathing is unchanged since last OV. Pt denies cough and Cp/tightness.    COPD: feels she is stable in terms of dyspnea, cough, wheeze. No new issues or worsening issues from resp stand point. Attended rehab only intermittently due to past and social issues (see below). Fatigue seemed to improve while in rehab. Also she is planning trip to [place] and wants reserve medication just in case. No associated fever. Overall symptom severity is moderte and is baseline unchanged   Soical: son died xmas 2015. Marland Kitchen 02-28-2023 had chole. Having grief. No rehab since Feb 28, 2015. ; was helping fatigue but now  fatigued again and more dyspneic. Denies wheeze but on exam has wheeze    has a past medical history of IBS (irritable bowel syndrome); Asthma; Lung nodule; Anemia; Hyperlipemia; Obesity; Arthritis; History of radiation therapy (05/30/11 to 07/07/11); History of pneumonia; History of shingles; Severe aortic valve stenosis  AVR 3/15 (10/01/2013); Rheumatic heart disease mitral stenosis; Anxiety; S/P AVR (aortic valve replacement) (2015); S/P MVR (mitral valve replacement) (2015); DDD (degenerative disc disease); Chronic diastolic heart failure (70/35/0093); Small bowel obstruction, partial (08/2013); Pancreatitis (11/2014); Osteopenia (12/2014); GERD (gastroesophageal reflux disease); Constipation; COPD (chronic obstructive pulmonary disease); PONV (postoperative nausea and vomiting); Essential hypertension; Shortness of breath dyspnea; Pneumonia; History of bronchitis; Joint pain; Chronic back pain; Rectal cancer (04/2011); History of blood transfusion; Cataracts, bilateral; Insomnia; Sleep apnea; and Seizures.   reports that she has quit smoking. Her smoking use included Cigarettes. She has a 15 pack-year smoking history. She has never used smokeless tobacco.  Past Surgical History  Procedure Laterality Date  . Foot surgery  left foot    hammer toe  . Hand surgery  Bil    carpal tunnel  . Back surgery  2006, 2007    2006 SPACER, 2007 decompression and fusion L4/5  . Tonsillectomy    . Colon resection  08/25/2011    Procedure: COLON RESECTION LAPAROSCOPIC;  Surgeon: Stark Klein, MD;  Location: [facility];  Service: General;  Laterality: N/A;  Laparoscopic Assisted  Low Anterior Resection Diverting Ostomy and onQ pain pump  . Ileostomy  08/25/2011  . Ileostomy closure  04/02/2012    Procedure: ILEOSTOMY TAKEDOWN;  Surgeon: Stark Klein, MD;  Location: [facility];  Service: General;  Laterality: N/A;  . Bowel resection  04/02/2012    Procedure: SMALL BOWEL RESECTION;  Surgeon: Stark Klein, MD;  Location: [facility];  Service: General;  Laterality: N/A;  . Carpal tunnel release Bilateral   . Cardiac catheterization  11/27/2013  . Tee without cardioversion N/A 11/29/2013    Procedure: TRANSESOPHAGEAL ECHOCARDIOGRAM (TEE);  Surgeon: Sueanne Margarita, MD;  Location: [facility];  Service: Cardiovascular;  Laterality: N/A;  . Aortic valve replacement N/A 12/12/2013    Procedure: AORTIC VALVE REPLACEMENT (AVR);  Surgeon: Gaye Pollack, MD; Service: Open Heart Surgery  . Mitral valve replacement N/A 12/12/2013    Procedure: MITRAL VALVE (MV) REPLACEMENT OR REPAIR;  Surgeon: Gaye Pollack, MD; Service: Open Heart Surgery  . Intraoperative transesophageal echocardiogram N/A 12/12/2013    Procedure: INTRAOPERATIVE TRANSESOPHAGEAL ECHOCARDIOGRAM;  Surgeon: Gaye Pollack, MD;  Location: [facility];  Service: Open Heart Surgery;  Laterality: N/A;  . Colonoscopy  03/2013    1 polyp, rpt 3 yrs Ardis Hughs)  . Left and right heart catheterization with coronary angiogram N/A 11/27/2013    Procedure: LEFT AND RIGHT HEART CATHETERIZATION WITH CORONARY ANGIOGRAM;  Surgeon: Sanda Klein, MD;  Location: [facility];  Service: Cardiovascular;  Laterality: N/A;  . Cholecystectomy N/A 01/21/2015    chronic cholecystitis, Stark Klein, MD    Allergies  Allergen Reactions  . Codeine Nausea Only  . Prednisone Hives    In different places all over the body.  . Sulfonamide Derivatives Nausea Only    Immunization History  Administered Date(s) Administered  . H1N1 09/17/2008  . Influenza Split 07/18/2012  . Influenza Whole 08/10/2005, 06/24/2009, 07/11/2011  . Influenza-Unspecified 07/24/2013, 06/30/2014  . Pneumococcal Conjugate-13 06/02/2014  . Pneumococcal Polysaccharide-23 08/10/2005, 04/21/2008, 06/25/2013  . Td 10/11/2005  . Zoster 09/16/2014    Family History  Problem Relation Age of Onset  . Cancer Father     lung  . Heart disease Mother   . Diabetes Mother   . Hypertension Mother   . Colon cancer Neg Hx   .  Esophageal cancer Neg Hx   . Stomach cancer Neg Hx   . Rectal cancer Neg Hx   . CAD Son 80    MI - deceased  . Stroke Mother   . Heart attack Maternal Grandfather      Current outpatient prescriptions:  .  albuterol (PROVENTIL HFA;VENTOLIN HFA) 108 (90 BASE) MCG/ACT inhaler, Inhale 2 puffs into the lungs every 6 (six) hours as needed for wheezing., Disp: , Rfl:  .  aspirin 81 MG tablet, Take 81 mg by mouth at bedtime. , Disp: , Rfl:  .  BIOTIN PO, Take 1 tablet by mouth at bedtime. , Disp: , Rfl:  .  BREO ELLIPTA 100-25 MCG/INH AEPB, inhale 1 puff by mouth once daily, Disp: 60 each, Rfl: 5 .  Calcium Carbonate-Vitamin D (CALCIUM 600 + D PO), Take 1,200 mg by mouth at bedtime. , Disp: , Rfl:  .  Cholecalciferol (VITAMIN D) 2000 UNITS CAPS, Take 1 capsule by mouth at bedtime. , Disp: , Rfl:  .  furosemide (LASIX) 40 MG tablet, Take 1 tablet (40 mg total) by mouth daily., Disp: 30 tablet, Rfl: 11 .  ipratropium-albuterol (DUONEB) 0.5-2.5 (3) MG/3ML SOLN, Take 3 mLs by nebulization  every 6 (six) hours as needed., Disp: 360 mL, Rfl: 0 .  lovastatin (MEVACOR) 40 MG tablet, take 1 tablet by mouth at bedtime, Disp: 30 tablet, Rfl: 6 .  Miconazole Nitrate 2 % POWD, 1 application by Does not apply route daily as needed (for skin). Apply to stomach and under breasts, Disp: , Rfl:  .  montelukast (SINGULAIR) 10 MG tablet, Take 1 tablet (10 mg total) by mouth at bedtime., Disp: 30 tablet, Rfl: 11 .  oxyCODONE (OXY IR/ROXICODONE) 5 MG immediate release tablet, Take 1-2 tablets (5-10 mg total) by mouth every 6 (six) hours as needed for moderate pain, severe pain or breakthrough pain., Disp: 30 tablet, Rfl: 0 .  pantoprazole (PROTONIX) 40 MG tablet, Take 1 tablet (40 mg total) by mouth at bedtime., Disp: 90 tablet, Rfl: 2 .  potassium chloride (K-DUR,KLOR-CON) 10 MEQ tablet, Take 2 tablets (20 mEq total) by mouth daily., Disp: 60 tablet, Rfl: 11 .  SPIRIVA HANDIHALER 18 MCG inhalation capsule, inhale the  contents of one capsule in the handihaler once daily, Disp: 30 capsule, Rfl: 4 .  traMADol (ULTRAM) 50 MG tablet, Take 50 mg by mouth every 6 (six) hours as needed for moderate pain., Disp: , Rfl:  .  polyethylene glycol (MIRALAX / GLYCOLAX) packet, Take 17 g by mouth daily. (Patient not taking: Reported on 02/09/2015), Disp: 14 each, Rfl: 0     Review of Systems  Constitutional: Negative for fever and unexpected weight change.  HENT: Negative for congestion, dental problem, ear pain, nosebleeds, postnasal drip, rhinorrhea, sinus pressure, sneezing, sore throat and trouble swallowing.   Eyes: Negative for redness and itching.  Respiratory: Positive for shortness of breath. Negative for cough, chest tightness and wheezing.   Cardiovascular: Negative for palpitations and leg swelling.  Gastrointestinal: Negative for nausea and vomiting.  Genitourinary: Negative for dysuria.  Musculoskeletal: Negative for joint swelling.  Skin: Negative for rash.  Neurological: Negative for headaches.  Hematological: Does not bruise/bleed easily.  Psychiatric/Behavioral: Negative for dysphoric mood. The patient is not nervous/anxious.        Objective:   Physical Exam  Filed Vitals:   02/09/15 0948  BP: 138/90  Pulse: 76  Height: 5\' 6"  (1.676 m)  Weight: 211 lb (95.709 kg)  SpO2: 96%    Vitals reviewed. Constitutional: She is oriented to person, place, and time. She appears well-developed and well-nourished. No distress.  Body mass index is 34.07 kg/(m^2).    HENT:  Head: Normocephalic and atraumatic.  Right Ear: External ear normal.  Left Ear: External ear normal.  Mouth/Throat: Oropharynx is clear and moist. No oropharyngeal exudate.  Eyes: Conjunctivae and EOM are normal. Pupils are equal, round, and reactive to light. Right eye exhibits no discharge. Left eye exhibits no discharge. No scleral icterus.  Neck: Normal range of motion. Neck supple. No JVD present. No tracheal deviation  present. No thyromegaly present.  Cardiovascular: Normal rate, regular rhythm, normal heart sounds and intact distal pulses.  Exam reveals no gallop and no friction rub.   No murmur heard. Pulmonary/Chest: Effort normal. No respiratory distress. She has wheezes today (did not aug 2015) She has no rales. She exhibits no tenderness.   Abdominal: Soft. Bowel sounds are normal. She exhibits no distension and no mass. There is no tenderness. There is no rebound and no guarding.  Musculoskeletal: Normal range of motion. She exhibits no edema and no tenderness.  Lymphadenopathy:    She has no cervical adenopathy.  Neurological: She is alert and  oriented to person, place, and time. She has normal reflexes. No cranial nerve deficit. She exhibits normal muscle tone. Coordination normal.  Skin: Skin is warm and dry. No rash noted. She is not diaphoretic. No erythema. No pallor.  Psychiatric: She has a normal mood and affect. Her behavior is normal. Judgment and thought content normal.              Assessment & Plan:     ICD-9-CM ICD-10-CM   1. COPD exacerbation 491.21 J44.1    #COPD and copd exacerbation - you are in some degree of flare up esp based on fact you have wheeze that was absent last time  -  continue current treatment plan  - do 5 day medrol   -Please take medrol 32 mg x1 day, then 24 mg x1 day, then 16 mg x1 day, then 8 mg x1 day, and then 4 mg x1 day and stop  - refer back  to pulmonary rehab   # for trip to [place] in June - Take doxycycline 100mg  po twice daily x 5 days; take after meals and avoid sunlight  - Take medrol as above -we are doing these scripts so save time   #FOllowup  6 months or sooner if needed    Dr. Brand Males, [name], [facility].C.P Pulmonary and Critical Care Medicine Staff Physician [place] Pager: [phone], If no answer or between  15:00h - 7:00h: call [phone]  02/12/2015 10:14 AM

## 2015-02-09 NOTE — Patient Instructions (Addendum)
#  COPD and copd exacerbation - you are in some degree of flare up   -  continue current treatment plan  - do 5 day medrol   -Please take medrol 32 mg x1 day, then 24 mg x1 day, then 16 mg x1 day, then 8 mg x1 day, and then 4 mg x1 day and stop  - refer back  to pulmonary rehab   # for trip to [place] in June - Take doxycycline 100mg  po twice daily x 5 days; take after meals and avoid sunlight  - Take medrol as above -we are doing these scripts so save time   #FOllowup  6 months or sooner if needed

## 2015-02-10 ENCOUNTER — Encounter (HOSPITAL_COMMUNITY): Payer: Self-pay

## 2015-02-10 ENCOUNTER — Telehealth: Payer: Self-pay | Admitting: Internal Medicine

## 2015-02-10 DIAGNOSIS — R0600 Dyspnea, unspecified: Secondary | ICD-10-CM | POA: Insufficient documentation

## 2015-02-10 DIAGNOSIS — Z954 Presence of other heart-valve replacement: Secondary | ICD-10-CM | POA: Insufficient documentation

## 2015-02-10 NOTE — Telephone Encounter (Signed)
[name] is calling in stating that the pt wants to be apart of the CHF program and [name] needs the pt's Ejection fraction and her most recent BP and HR readings. Please f/u with her   The fax number given is [phone]  Thanks

## 2015-02-10 NOTE — Telephone Encounter (Signed)
[name] is returning [name] call

## 2015-02-10 NOTE — Telephone Encounter (Signed)
Returned call to [place] with [facility] no answer.[place].Patient's last EF by Echo 08/26/14 60-65%.11/26/14 office visit with Richardson Dopp PA Pulse 86,B/P 100/70.

## 2015-02-10 NOTE — Telephone Encounter (Signed)
Information given ,to [place]. She verbalized understanding.

## 2015-02-12 ENCOUNTER — Encounter (HOSPITAL_COMMUNITY): Payer: Self-pay

## 2015-02-17 ENCOUNTER — Encounter (HOSPITAL_COMMUNITY)
Admission: RE | Admit: 2015-02-17 | Discharge: 2015-02-17 | Disposition: A | Discharge status: Home / Self Care | Payer: Self-pay | Source: Ambulatory Visit | Attending: Internal Medicine | Admitting: Internal Medicine

## 2015-02-19 ENCOUNTER — Encounter (HOSPITAL_COMMUNITY): Payer: Self-pay

## 2015-02-23 ENCOUNTER — Other Ambulatory Visit: Payer: Self-pay | Admitting: Internal Medicine

## 2015-02-24 ENCOUNTER — Encounter (HOSPITAL_COMMUNITY)
Admission: RE | Admit: 2015-02-24 | Discharge: 2015-02-24 | Disposition: A | Discharge status: Home / Self Care | Payer: Self-pay | Source: Ambulatory Visit | Attending: Internal Medicine | Admitting: Internal Medicine

## 2015-02-25 ENCOUNTER — Ambulatory Visit (INDEPENDENT_AMBULATORY_CARE_PROVIDER_SITE_OTHER): Payer: Medicare Other | Admitting: Family Medicine

## 2015-02-25 ENCOUNTER — Encounter: Payer: Self-pay | Admitting: Family Medicine

## 2015-02-25 ENCOUNTER — Ambulatory Visit: Payer: Medicare Other | Admitting: Family Medicine

## 2015-02-25 VITALS — BP 122/78 | HR 74 | Temp 98.4°F | Wt 210.0 lb

## 2015-02-25 DIAGNOSIS — R5383 Other fatigue: Secondary | ICD-10-CM

## 2015-02-25 DIAGNOSIS — J449 Chronic obstructive pulmonary disease, unspecified: Secondary | ICD-10-CM

## 2015-02-25 DIAGNOSIS — K8010 Calculus of gallbladder with chronic cholecystitis without obstruction: Secondary | ICD-10-CM

## 2015-02-25 DIAGNOSIS — E785 Hyperlipidemia, unspecified: Secondary | ICD-10-CM | POA: Diagnosis not present

## 2015-02-25 DIAGNOSIS — Z79899 Other long term (current) drug therapy: Secondary | ICD-10-CM | POA: Diagnosis not present

## 2015-02-25 DIAGNOSIS — G4700 Insomnia, unspecified: Secondary | ICD-10-CM | POA: Diagnosis not present

## 2015-02-25 DIAGNOSIS — F5104 Psychophysiologic insomnia: Secondary | ICD-10-CM | POA: Insufficient documentation

## 2015-02-25 LAB — CBC WITH DIFFERENTIAL/PLATELET
Basophils Absolute: 0 10*3/uL (ref 0.0–0.1)
Basophils Relative: 0.6 % (ref 0.0–3.0)
Eosinophils Absolute: 0.1 10*3/uL (ref 0.0–0.7)
Eosinophils Relative: 2.8 % (ref 0.0–5.0)
HCT: 38.3 % (ref 36.0–46.0)
Hemoglobin: 13 g/dL (ref 12.0–15.0)
Lymphocytes Relative: 15.7 % (ref 12.0–46.0)
Lymphs Abs: 0.8 10*3/uL (ref 0.7–4.0)
MCHC: 33.9 g/dL (ref 30.0–36.0)
MCV: 86.9 fl (ref 78.0–100.0)
Monocytes Absolute: 0.5 10*3/uL (ref 0.1–1.0)
Monocytes Relative: 9.1 % (ref 3.0–12.0)
Neutro Abs: 3.7 10*3/uL (ref 1.4–7.7)
Neutrophils Relative %: 71.8 % (ref 43.0–77.0)
Platelets: 158 10*3/uL (ref 150.0–400.0)
RBC: 4.41 Mil/uL (ref 3.87–5.11)
RDW: 15.6 % — ABNORMAL HIGH (ref 11.5–15.5)
WBC: 5.1 10*3/uL (ref 4.0–10.5)

## 2015-02-25 LAB — COMPREHENSIVE METABOLIC PANEL
ALT: 13 U/L (ref 0–35)
AST: 22 U/L (ref 0–37)
Albumin: 3.9 g/dL (ref 3.5–5.2)
Alkaline Phosphatase: 76 U/L (ref 39–117)
BUN: 16 mg/dL (ref 6–23)
CO2: 31 mEq/L (ref 19–32)
Calcium: 9.6 mg/dL (ref 8.4–10.5)
Chloride: 101 mEq/L (ref 96–112)
Creatinine, Ser: 1.17 mg/dL (ref 0.40–1.20)
GFR: 48.65 mL/min — ABNORMAL LOW (ref >60.00)
Glucose, Bld: 93 mg/dL (ref 70–99)
Potassium: 3.9 mEq/L (ref 3.5–5.1)
Sodium: 138 mEq/L (ref 135–145)
Total Bilirubin: 0.7 mg/dL (ref 0.2–1.2)
Total Protein: 6.8 g/dL (ref 6.0–8.3)

## 2015-02-25 LAB — VITAMIN B12: Vitamin B-12: 191 pg/mL — ABNORMAL LOW (ref 211–911)

## 2015-02-25 LAB — LDL CHOLESTEROL, DIRECT: Direct LDL: 138 mg/dL

## 2015-02-25 LAB — LIPASE: Lipase: 36 U/L (ref 11.0–59.0)

## 2015-02-25 MED ORDER — TRAZODONE HCL 50 MG PO TABS: 25.0000 mg | ORAL_TABLET | Freq: Every evening | ORAL | Status: DC | PRN

## 2015-02-25 MED ORDER — FLUTICASONE FUROATE-VILANTEROL 200-25 MCG/INH IN AEPB: 1.0000 | INHALATION_SPRAY | Freq: Every day | RESPIRATORY_TRACT | Status: DC

## 2015-02-25 NOTE — Assessment & Plan Note (Signed)
Sleep maintenance insomnia ongoing for several months - anticipate related some to grieving after losing son. Briefly discussed sleep hygiene Will trial trazodone 25-50mg  nightly prn. Pt not interested in non-benzo hypnotic currently.

## 2015-02-25 NOTE — Assessment & Plan Note (Signed)
Followed by pulm. Treated for 2 mild COPD exacerbations in last 2 months - anticipate more related to poor COPD control despite compliance with breo and spiriva. Will increase breo to 200/25 dose.

## 2015-02-25 NOTE — Assessment & Plan Note (Signed)
Check dLDL today. Lab Results  Component Value Date   LDLCALC 140* 10/08/2014

## 2015-02-25 NOTE — Progress Notes (Signed)
Pre visit review using our clinic review tool, if applicable. No additional management support is needed unless otherwise documented below in the visit note. 

## 2015-02-25 NOTE — Assessment & Plan Note (Signed)
Seems to have recovered well from surgery - check lipase.

## 2015-02-25 NOTE — Patient Instructions (Addendum)
Let's increase breo to 200 mcg dose. Trial trazodone 1/2- 1 tablet nightly for sleep. Avoid turning TV on at night time when you awaken. labwork today. Have a safe trip to [place]!

## 2015-02-25 NOTE — Progress Notes (Signed)
BP 122/78 mmHg  Pulse 74  Temp(Src) 98.4 F (36.9 C) (Oral)  Wt 210 lb (95.255 kg)  SpO2 98%   CC: 3 mo f/u visit  Subjective:    Patient ID: [name], female    DOB: 1945-12-31, 69 y.o.   MRN: [id number]  HPI: [name] is a 69 y.o. female presenting on 02/25/2015 for Follow-up   Since last seen here, has had cholecystectomy, and 2 COPD exacerbations both treated. Referred to pulmonary rehab. She feels this is helpful. Planned WASP medrol/doxycycline for upcoming trip with granddaughter to [place] per pulm. Denies fevers/chills, abd pain, nausea/vomiting.  No energy since gallbladder surgery. Increased diarrhea noted as well. 6-7 times per day. Appetite improving. Has not found relation to food.   H/o rectal cancer - stable per Dr Benay Spice.  Ongoing issue over months with sleep maintenance insomnia.  Relevant past medical, surgical, family and social history reviewed and updated as indicated. Interim medical history since our last visit reviewed. Allergies and medications reviewed and updated. Current Outpatient Prescriptions on File Prior to Visit  Medication Sig  . albuterol (PROVENTIL HFA;VENTOLIN HFA) 108 (90 BASE) MCG/ACT inhaler Inhale 2 puffs into the lungs every 6 (six) hours as needed for wheezing.  Marland Kitchen aspirin 81 MG tablet Take 81 mg by mouth at bedtime.   Marland Kitchen BIOTIN PO Take 1 tablet by mouth at bedtime.   Marland Kitchen BREO ELLIPTA 100-25 MCG/INH AEPB inhale 1 puff by mouth once daily  . Calcium Carbonate-Vitamin D (CALCIUM 600 + D PO) Take 1,200 mg by mouth at bedtime.   . Cholecalciferol (VITAMIN D) 2000 UNITS CAPS Take 1 capsule by mouth at bedtime.   Marland Kitchen doxycycline (VIBRA-TABS) 100 MG tablet Take 1 tablet, twice daily for 5 days. Avoid sunlight and take after meals.  . furosemide (LASIX) 40 MG tablet Take 1 tablet (40 mg total) by mouth daily.  Marland Kitchen ipratropium-albuterol (DUONEB) 0.5-2.5 (3) MG/3ML SOLN Take 3 mLs by nebulization every 6 (six) hours as needed.  .  lovastatin (MEVACOR) 40 MG tablet take 1 tablet by mouth at bedtime  . methylPREDNISolone (MEDROL) 8 MG tablet 32 mg x1 day, then 24 mg x1 day, then 16 mg x1 day, then 8 mg x1 day, and then 4 mg x1 day and stop  . Miconazole Nitrate 2 % POWD 1 application by Does not apply route daily as needed (for skin). Apply to stomach and under breasts  . montelukast (SINGULAIR) 10 MG tablet take 1 tablet by mouth at bedtime  . pantoprazole (PROTONIX) 40 MG tablet Take 1 tablet (40 mg total) by mouth at bedtime.  . polyethylene glycol (MIRALAX / GLYCOLAX) packet Take 17 g by mouth daily.  . potassium chloride (K-DUR,KLOR-CON) 10 MEQ tablet Take 2 tablets (20 mEq total) by mouth daily.  Marland Kitchen SPIRIVA HANDIHALER 18 MCG inhalation capsule inhale the contents of one capsule in the handihaler once daily  . traMADol (ULTRAM) 50 MG tablet Take 50 mg by mouth every 6 (six) hours as needed for moderate pain.   No current facility-administered medications on file prior to visit.    Review of Systems Per HPI unless specifically indicated above     Objective:    BP 122/78 mmHg  Pulse 74  Temp(Src) 98.4 F (36.9 C) (Oral)  Wt 210 lb (95.255 kg)  SpO2 98%  Wt Readings from Last 3 Encounters:  02/25/15 210 lb (95.255 kg)  02/09/15 211 lb (95.709 kg)  01/21/15 214 lb (97.07 kg)  Physical Exam  Constitutional: She appears well-developed and well-nourished. No distress.  HENT:  Mouth/Throat: Oropharynx is clear and moist. No oropharyngeal exudate.  Cardiovascular: Normal rate, regular rhythm and intact distal pulses.   Murmur (2/6 SEM) heard. Pulmonary/Chest: Effort normal. No respiratory distress. She has no decreased breath sounds. She has wheezes (faint exp wheezing throughout). She has no rhonchi. She has no rales.  Psychiatric: She has a normal mood and affect.  Nursing note and vitals reviewed.      Assessment & Plan:   Problem List Items Addressed This Visit    Chronic cholecystitis with calculus     Seems to have recovered well from surgery - check lipase.      Relevant Orders   Comprehensive metabolic panel   Lipase   Chronic insomnia    Sleep maintenance insomnia ongoing for several months - anticipate related some to grieving after losing son. Briefly discussed sleep hygiene Will trial trazodone 25-50mg  nightly prn. Pt not interested in non-benzo hypnotic currently.      COPD (chronic obstructive pulmonary disease) - Primary    Followed by pulm. Treated for 2 mild COPD exacerbations in last 2 months - anticipate more related to poor COPD control despite compliance with breo and spiriva. Will increase breo to 200/25 dose.      Relevant Medications   Fluticasone Furoate-Vilanterol (BREO ELLIPTA) 200-25 MCG/INH AEPB   HLD (hyperlipidemia)    Check dLDL today. Lab Results  Component Value Date   LDLCALC 140* 10/08/2014        Relevant Orders   LDL cholesterol, direct    Other Visit Diagnoses    Other fatigue        Relevant Orders    Vitamin B12    CBC with Differential/Platelet        Follow up plan: Return in about 4 months (around 06/28/2015), or as needed, for follow up visit.

## 2015-02-26 ENCOUNTER — Encounter (HOSPITAL_COMMUNITY)
Admission: RE | Admit: 2015-02-26 | Discharge: 2015-02-26 | Disposition: A | Discharge status: Home / Self Care | Payer: Self-pay | Source: Ambulatory Visit | Attending: Internal Medicine | Admitting: Internal Medicine

## 2015-02-27 ENCOUNTER — Telehealth: Payer: Self-pay

## 2015-02-27 NOTE — Telephone Encounter (Signed)
Pt left vm requesting cb when lab results for 02/25/15 is available.Please advise.

## 2015-02-28 ENCOUNTER — Other Ambulatory Visit: Payer: Self-pay | Admitting: Family Medicine

## 2015-02-28 ENCOUNTER — Encounter: Payer: Self-pay | Admitting: Family Medicine

## 2015-02-28 DIAGNOSIS — E538 Deficiency of other specified B group vitamins: Secondary | ICD-10-CM | POA: Insufficient documentation

## 2015-02-28 HISTORY — DX: Deficiency of other specified B group vitamins: E53.8

## 2015-02-28 NOTE — Telephone Encounter (Signed)
Results released via mychart. 

## 2015-03-03 ENCOUNTER — Encounter (HOSPITAL_COMMUNITY): Payer: Self-pay

## 2015-03-03 ENCOUNTER — Telehealth: Payer: Self-pay | Admitting: Family Medicine

## 2015-03-03 NOTE — Telephone Encounter (Signed)
Pt returned your call, please call back at home number, thanks

## 2015-03-03 NOTE — Telephone Encounter (Signed)
Spoke to patient, see result note. 

## 2015-03-04 ENCOUNTER — Ambulatory Visit (INDEPENDENT_AMBULATORY_CARE_PROVIDER_SITE_OTHER): Payer: Medicare Other | Admitting: *Deleted

## 2015-03-04 DIAGNOSIS — E538 Deficiency of other specified B group vitamins: Secondary | ICD-10-CM | POA: Diagnosis not present

## 2015-03-04 MED ORDER — CYANOCOBALAMIN 1000 MCG/ML IJ SOLN
1000.0000 ug | Freq: Once | INTRAMUSCULAR | Status: AC
  Administered 2015-03-04: 1000 ug via INTRAMUSCULAR

## 2015-03-05 ENCOUNTER — Encounter (HOSPITAL_COMMUNITY)
Admission: RE | Admit: 2015-03-05 | Discharge: 2015-03-05 | Disposition: A | Discharge status: Home / Self Care | Payer: Self-pay | Source: Ambulatory Visit | Attending: Internal Medicine | Admitting: Internal Medicine

## 2015-03-12 ENCOUNTER — Encounter (HOSPITAL_COMMUNITY)
Admission: RE | Admit: 2015-03-12 | Discharge: 2015-03-12 | Disposition: A | Discharge status: Home / Self Care | Payer: Self-pay | Source: Ambulatory Visit | Attending: Internal Medicine | Admitting: Internal Medicine

## 2015-03-12 DIAGNOSIS — R0600 Dyspnea, unspecified: Secondary | ICD-10-CM | POA: Insufficient documentation

## 2015-03-12 DIAGNOSIS — Z954 Presence of other heart-valve replacement: Secondary | ICD-10-CM | POA: Insufficient documentation

## 2015-03-13 ENCOUNTER — Ambulatory Visit (INDEPENDENT_AMBULATORY_CARE_PROVIDER_SITE_OTHER): Payer: Medicare Other | Admitting: Cardiovascular Disease

## 2015-03-13 ENCOUNTER — Encounter: Payer: Self-pay | Admitting: Cardiovascular Disease

## 2015-03-13 VITALS — BP 140/70 | HR 75 | Resp 16 | Ht 66.0 in | Wt 214.0 lb

## 2015-03-13 DIAGNOSIS — I10 Essential (primary) hypertension: Secondary | ICD-10-CM | POA: Diagnosis not present

## 2015-03-13 DIAGNOSIS — Z954 Presence of other heart-valve replacement: Secondary | ICD-10-CM

## 2015-03-13 DIAGNOSIS — I5032 Chronic diastolic (congestive) heart failure: Secondary | ICD-10-CM | POA: Diagnosis not present

## 2015-03-13 DIAGNOSIS — Z952 Presence of prosthetic heart valve: Secondary | ICD-10-CM

## 2015-03-13 DIAGNOSIS — J449 Chronic obstructive pulmonary disease, unspecified: Secondary | ICD-10-CM

## 2015-03-13 DIAGNOSIS — J4540 Moderate persistent asthma, uncomplicated: Secondary | ICD-10-CM

## 2015-03-13 MED ORDER — PANTOPRAZOLE SODIUM 40 MG PO TBEC: 40.0000 mg | DELAYED_RELEASE_TABLET | Freq: Every day | ORAL | Status: DC

## 2015-03-13 MED ORDER — POTASSIUM CHLORIDE CRYS ER 10 MEQ PO TBCR: 20.0000 meq | EXTENDED_RELEASE_TABLET | Freq: Every day | ORAL | Status: DC

## 2015-03-13 MED ORDER — FUROSEMIDE 40 MG PO TABS: 40.0000 mg | ORAL_TABLET | Freq: Every day | ORAL | Status: DC

## 2015-03-13 NOTE — Progress Notes (Signed)
Patient ID: [name], female   DOB: 1946-03-24, 69 y.o.   MRN: [id number]     Cardiology Office Note   Date:  03/14/2015   ID:  [name], DOB 29-Dec-1945, MRN [id number]  PCP:  Ria Bush, MD  Cardiologist:   Sanda Klein, MD   Chief Complaint  Patient presents with  . Follow-up    Patient feels SOB most of the time.      History of Present Illness: [name] is a 69 y.o. female who presents for follow-up for valvular heart disease and diastolic heart failure.   She has had a difficult year. Her son died last Christmas. She tears up when she talks about him. In April she had to undergo cholecystectomy she had to interrupt pulmonary rehabilitation for a while, but is now going back and feels that it is helping. She has chronic dyspnea on exertion functional class II and wheezes frequently. She is taking both long-acting and short-acting bronchodilation and her dose of inhaled styloid was recently increased. She has not had problems with lower extremity edema or chest pain or dizziness or syncope. She denies palpitations. She remains moderately to severely obese. She underwent cardiopulmonary stress testing late last year that showed that deconditioning and obesity are the most important components of her chronic dyspnea.  She is actually doing a fair amount of exercise in pulmonary rehabilitation. She walks 13 laps, uses the stationary bike for the equivalent to 1 mile and more than 1000 steps on the stepper each time.  Roughly one year ago she underwent replacement of both the aortic and mitral valves with biological prostheses for aortic and mitral stenosis that were likely rheumatic in etiology. He did not have coronary artery stenoses of preoperative angiography. She had transient postoperative complete heart block. She has moderately elevated gradients across the mitral valve biological prosthesis, possibly due to a moderate amount of patient valve mismatch.      Past Medical History  Diagnosis Date  . IBS (irritable bowel syndrome)   . Asthma   . Lung nodule     RLL nodule-35mm stable 2006, April 2009, and June 2009  . Anemia   . Hyperlipemia     takes Lovastatin daily  . Obesity   . Arthritis   . History of radiation therapy 05/30/11 to 07/07/11    rectum  . History of pneumonia     several times  . History of shingles   . Severe aortic valve stenosis  AVR 3/15 10/01/2013    mild-mod by echo 02/2014  . Rheumatic heart disease mitral stenosis     mod MS by echo 02/2014  . Anxiety   . S/P AVR (aortic valve replacement) 2015    bioprosthetic (Bartle)  . S/P MVR (mitral valve replacement) 2015    bioprosthetic (Bartle)  . DDD (degenerative disc disease)     cervical - kyphosis with mod DD changes C5/6 and C6/7; lumbar - early DD at L2/3 (Elsner)  . Chronic diastolic heart failure 71/03/2693  . Small bowel obstruction, partial 08/2013    reolved without NGT placement.   . Pancreatitis 11/2014    ?zpack related vs gallstone pancreatitis with abnormal HIDA scan pending cholecystectomy  . Osteopenia 12/2014    T -1.5 hip  . GERD (gastroesophageal reflux disease)     takes Protonix daily  . Constipation     takes Miralax daily as needed  . COPD (chronic obstructive pulmonary disease)     Albuterol inhaler daily  as needed;Duoneb daily as needed;Spiriva daily  . PONV (postoperative nausea and vomiting)   . Essential hypertension     was on meds but after appointment Dec 11 with Dr.C he took her off  . Shortness of breath dyspnea     with exertion  . Pneumonia     Jan 2015  . History of bronchitis     last time a month ago  . Joint pain   . Chronic back pain     stenosis  . Rectal cancer 04/2011    T3N0; s/p lap LAR, s/p ileostomy, s/p reversal, s/p chemo  . History of blood transfusion   . Cataracts, bilateral     immature  . Insomnia   . Sleep apnea     PT TOLD BORDERLINE-TRIED CPAP-DID NOT HELP-SHE DOES NOT USE CPAP  .  Seizures     febrile seizures as a child  . Vitamin B12 deficiency 02/28/2015    Start B12 shots 02/2015     Past Surgical History  Procedure Laterality Date  . Foot surgery  left foot    hammer toe  . Hand surgery  Bil    carpal tunnel  . Back surgery  2006, 2007    2006 SPACER, 2007 decompression and fusion L4/5  . Tonsillectomy    . Colon resection  08/25/2011    Procedure: COLON RESECTION LAPAROSCOPIC;  Surgeon: Stark Klein, MD;  Location: [facility];  Service: General;  Laterality: N/A;  Laparoscopic Assisted Low Anterior Resection Diverting Ostomy and onQ pain pump  . Ileostomy  08/25/2011  . Ileostomy closure  04/02/2012    Procedure: ILEOSTOMY TAKEDOWN;  Surgeon: Stark Klein, MD;  Location: [facility];  Service: General;  Laterality: N/A;  . Bowel resection  04/02/2012    Procedure: SMALL BOWEL RESECTION;  Surgeon: Stark Klein, MD;  Location: [facility];  Service: General;  Laterality: N/A;  . Carpal tunnel release Bilateral   . Tee without cardioversion N/A 11/29/2013    Procedure: TRANSESOPHAGEAL ECHOCARDIOGRAM (TEE);  Surgeon: Sueanne Margarita, MD;  Location: [facility];  Service: Cardiovascular;  Laterality: N/A;  . Aortic valve replacement N/A 12/12/2013    Procedure: AORTIC VALVE REPLACEMENT (AVR);  Surgeon: Gaye Pollack, MD; Service: Open Heart Surgery  . Mitral valve replacement N/A 12/12/2013    Procedure: MITRAL VALVE (MV) REPLACEMENT OR REPAIR;  Surgeon: Gaye Pollack, MD; Service: Open Heart Surgery  . Intraoperative transesophageal echocardiogram N/A 12/12/2013    Procedure: INTRAOPERATIVE TRANSESOPHAGEAL ECHOCARDIOGRAM;  Surgeon: Gaye Pollack, MD;  Location: [facility];  Service: Open Heart Surgery;  Laterality: N/A;  . Colonoscopy  03/2013    1 polyp, rpt 3 yrs Ardis Hughs)  . Left and right heart catheterization with coronary angiogram N/A 11/27/2013    Procedure: LEFT AND RIGHT HEART CATHETERIZATION WITH CORONARY ANGIOGRAM;  Surgeon: Sanda Klein, MD;  Location: [facility];   Service: Cardiovascular;  Laterality: N/A;  . Cholecystectomy N/A 01/21/2015    chronic cholecystitis, Stark Klein, MD     Current Outpatient Prescriptions  Medication Sig Dispense Refill  . albuterol (PROVENTIL HFA;VENTOLIN HFA) 108 (90 BASE) MCG/ACT inhaler Inhale 2 puffs into the lungs every 6 (six) hours as needed for wheezing.    Marland Kitchen aspirin 81 MG tablet Take 81 mg by mouth at bedtime.     Marland Kitchen BIOTIN PO Take 1 tablet by mouth at bedtime.     Marland Kitchen BREO ELLIPTA 100-25 MCG/INH AEPB inhale 1 puff by mouth once daily 60 each 5  .  Calcium Carbonate-Vitamin D (CALCIUM 600 + D PO) Take 1,200 mg by mouth at bedtime.     . Cholecalciferol (VITAMIN D) 2000 UNITS CAPS Take 1 capsule by mouth at bedtime.     . cyanocobalamin (,VITAMIN B-12,) 1000 MCG/ML injection Inject 1 mL (1,000 mcg total) into the muscle every 30 (thirty) days.    Marland Kitchen doxycycline (VIBRA-TABS) 100 MG tablet Take 1 tablet, twice daily for 5 days. Avoid sunlight and take after meals. 10 tablet 0  . Fluticasone Furoate-Vilanterol (BREO ELLIPTA) 200-25 MCG/INH AEPB Inhale 1 puff into the lungs daily. 1 each 0  . furosemide (LASIX) 40 MG tablet Take 1 tablet (40 mg total) by mouth daily. 30 tablet 11  . ipratropium-albuterol (DUONEB) 0.5-2.5 (3) MG/3ML SOLN Take 3 mLs by nebulization every 6 (six) hours as needed. 360 mL 0  . lovastatin (MEVACOR) 40 MG tablet take 1 tablet by mouth at bedtime 30 tablet 6  . methylPREDNISolone (MEDROL) 8 MG tablet 32 mg x1 day, then 24 mg x1 day, then 16 mg x1 day, then 8 mg x1 day, and then 4 mg x1 day and stop 11 tablet 0  . Miconazole Nitrate 2 % POWD 1 application by Does not apply route daily as needed (for skin). Apply to stomach and under breasts    . montelukast (SINGULAIR) 10 MG tablet take 1 tablet by mouth at bedtime 30 tablet 5  . pantoprazole (PROTONIX) 40 MG tablet Take 1 tablet (40 mg total) by mouth at bedtime. 30 tablet 11  . polyethylene glycol (MIRALAX / GLYCOLAX) packet Take 17 g by mouth  daily. 14 each 0  . potassium chloride (K-DUR,KLOR-CON) 10 MEQ tablet Take 2 tablets (20 mEq total) by mouth daily. 60 tablet 11  . SPIRIVA HANDIHALER 18 MCG inhalation capsule inhale the contents of one capsule in the handihaler once daily 30 capsule 4  . traMADol (ULTRAM) 50 MG tablet Take 50 mg by mouth every 6 (six) hours as needed for moderate pain.    . traZODone (DESYREL) 50 MG tablet Take 0.5-1 tablets (25-50 mg total) by mouth at bedtime as needed for sleep. 30 tablet 1   No current facility-administered medications for this visit.    Allergies:   Codeine; Prednisone; and Sulfonamide derivatives    Social History:  The patient  reports that she has quit smoking. Her smoking use included Cigarettes. She has a 15 pack-year smoking history. She has never used smokeless tobacco. She reports that she drinks alcohol. She reports that she does not use illicit drugs.   Family History:  The patient's family history includes CAD (age of onset: 40) in her son; Cancer in her father; Diabetes in her mother; Heart attack in her maternal grandfather; Heart disease in her mother; Hypertension in her mother; Stroke in her mother. There is no history of Colon cancer, Esophageal cancer, Stomach cancer, or Rectal cancer.    ROS:  Please see the history of present illness.    Otherwise, review of systems positive for none.   All other systems are reviewed and negative.    PHYSICAL EXAM: VS:  BP 140/70 mmHg  Pulse 75  Resp 16  Ht 5\' 6"  (1.676 m)  Wt 97.07 kg (214 lb)  BMI 34.56 kg/m2 , BMI Body mass index is 34.56 kg/(m^2).  General: Alert, oriented x3, no distress  Head: no evidence of trauma, PERRL, EOMI, no exophtalmos or lid lag, no myxedema, no xanthelasma; normal ears, nose and oropharynx  Neck: normal jugular venous  pulsations and no hepatojugular reflux; brisk carotid pulses without delay and no carotid bruits  Chest: Well-healed sternotomy scar, scattered bilateral wheezes are heard,  no signs of consolidation by percussion or palpation, normal fremitus, symmetrical and full respiratory excursions  Cardiovascular: normal position and quality of the apical impulse, regular rhythm, normal first and second heart sounds, or prosthetic valve clicks, grade 3-8/9 systolic ejection murmur at the aortic focus is early peaking, no diastolic murmurs, rubs or gallops  Abdomen: no tenderness or distention, no masses by palpation, no abnormal pulsatility or arterial bruits, normal bowel sounds, no hepatosplenomegaly  Extremities: no clubbing, cyanosis or edema; 2+ radial, ulnar and brachial pulses bilaterally; 2+ right femoral, posterior tibial and dorsalis pedis pulses; 2+ left femoral, posterior tibial and dorsalis pedis pulses; no subclavian or femoral bruits  Neurological: grossly nonfocal Psych: euthymic mood, full affect. She becomes sad and cries a bit when talking about her son, but overall seems to be coping well.   EKG:  EKG is not ordered today.    Recent Labs: 08/25/2014: Pro B Natriuretic peptide (BNP) 666.3* 11/16/2014: B Natriuretic Peptide 211.0* 01/22/2015: Magnesium 2.0 02/25/2015: ALT 13; BUN 16; Creatinine 1.17; Hemoglobin 13.0; Platelets 158.0; Potassium 3.9; Sodium 138    Lipid Panel    Component Value Date/Time   CHOL 214* 10/08/2014 1022   TRIG 137.0 10/08/2014 1022   HDL 46.60 10/08/2014 1022   CHOLHDL 5 10/08/2014 1022   VLDL 27.4 10/08/2014 1022   LDLCALC 140* 10/08/2014 1022   LDLDIRECT 138.0 02/25/2015 1008      Wt Readings from Last 3 Encounters:  03/13/15 97.07 kg (214 lb)  02/25/15 95.255 kg (210 lb)  02/09/15 95.709 kg (211 lb)     ASSESSMENT AND PLAN:  S/P aortic and mitral valve bioprostheses - 12/2013 She has a relatively small 21 mm aortic/25 mm mitral valve biological prosthesis and may have some degree of patient valve mismatch that could contribute to her dyspnea. The echo in August 2015 showed favorable gradients, but the recent  study showed a mean mitral gradient of 11 mm Hg. Unfortunately, the heart rate was not documented in the report, making it very hard to do a true comparison.  Important to avoid tachycardia, since the mitral prosthesis is intrinsically stenotic.  She has no edema or other signs of overt hypervolemia. She is wheezing suggesting that her reactive airway disease is acting up. The September cardiopulmonary stress test suggested that deconditioning/obesity are the most important contributing factors to her dyspnea. Will follow the patient-valve mismatch with periodic echocardiography.  She has mild hyperlipidemia with an LDL cholesterol of around 130 but has no coronary or other vascular abnormalities. We discussed dietary changes and recommended continued exercise and pulmonary rehabilitation.   Current medicines are reviewed at length with the patient today.  The patient does not have concerns regarding medicines.  The following changes have been made:  no change  Labs/ tests ordered today include:  Orders Placed This Encounter  Procedures  . ECHOCARDIOGRAM COMPLETE    Patient Instructions  Your physician has requested that you have an echocardiogram in December 2016. Echocardiography is a painless test that uses sound waves to create images of your heart. It provides your doctor with information about the size and shape of your heart and how well your heart's chambers and valves are working. This procedure takes approximately one hour. There are no restrictions for this procedure.  Dr Sallyanne Kuster recommends that you schedule a follow-up appointment in 1 year. You  will receive a reminder letter in the mail two months in advance. If you don't receive a letter, please call our office to schedule the follow-up appointment.    Mikael Spray, MD  03/14/2015 2:22 PM    Sanda Klein, MD, [facility] [phone] office [phone] pager

## 2015-03-13 NOTE — Patient Instructions (Signed)
Your physician has requested that you have an echocardiogram in December 2016. Echocardiography is a painless test that uses sound waves to create images of your heart. It provides your doctor with information about the size and shape of your heart and how well your heart's chambers and valves are working. This procedure takes approximately one hour. There are no restrictions for this procedure.  Dr Sallyanne Kuster recommends that you schedule a follow-up appointment in 1 year. You will receive a reminder letter in the mail two months in advance. If you don't receive a letter, please call our office to schedule the follow-up appointment.

## 2015-03-14 ENCOUNTER — Encounter: Payer: Self-pay | Admitting: Cardiovascular Disease

## 2015-03-16 ENCOUNTER — Encounter: Payer: Self-pay | Admitting: Family Medicine

## 2015-03-16 ENCOUNTER — Ambulatory Visit (INDEPENDENT_AMBULATORY_CARE_PROVIDER_SITE_OTHER): Payer: Medicare Other | Admitting: Family Medicine

## 2015-03-16 VITALS — BP 142/78 | HR 68 | Temp 97.8°F | Wt 216.8 lb

## 2015-03-16 DIAGNOSIS — R3915 Urgency of urination: Secondary | ICD-10-CM

## 2015-03-16 DIAGNOSIS — G4700 Insomnia, unspecified: Secondary | ICD-10-CM | POA: Diagnosis not present

## 2015-03-16 DIAGNOSIS — N3946 Mixed incontinence: Secondary | ICD-10-CM | POA: Insufficient documentation

## 2015-03-16 DIAGNOSIS — F5104 Psychophysiologic insomnia: Secondary | ICD-10-CM

## 2015-03-16 HISTORY — DX: Mixed incontinence: N39.46

## 2015-03-16 LAB — POCT URINALYSIS DIPSTICK
Bilirubin, UA: NEGATIVE
Blood, UA: NEGATIVE
Glucose, UA: NEGATIVE
Ketones, UA: NEGATIVE
Leukocytes, UA: NEGATIVE
Nitrite, UA: NEGATIVE
Protein, UA: NEGATIVE
Spec Grav, UA: 1.02
Urobilinogen, UA: 0.2
pH, UA: 6

## 2015-03-16 MED ORDER — FLUTICASONE FUROATE-VILANTEROL 200-25 MCG/INH IN AEPB: 1.0000 | INHALATION_SPRAY | Freq: Every day | RESPIRATORY_TRACT | Status: DC

## 2015-03-16 NOTE — Progress Notes (Signed)
BP 142/78 mmHg  Pulse 68  Temp(Src) 97.8 F (36.6 C) (Oral)  Wt 216 lb 12 oz (98.317 kg)   CC: UTI  Subjective:    Patient ID: [name], female    DOB: 05/21/1946, 69 y.o.   MRN: [id number]  HPI: [name] is a 69 y.o. female presenting on 03/16/2015 for Urinary Tract Infection   UTI sxs started 1 week ago - self treated with AZO and amoxicillin left over form dentist. sxs improved but still feeling occasional urinary urgency and pressure. No dysuria. Last week with abd discomfort but that has resolved.  No fevers/chills, back or flank pain, nausea/vomiting.   Last UTI was remotely.  Trazodone has not helped.   Relevant past medical, surgical, family and social history reviewed and updated as indicated. Interim medical history since our last visit reviewed. Allergies and medications reviewed and updated. Current Outpatient Prescriptions on File Prior to Visit  Medication Sig  . albuterol (PROVENTIL HFA;VENTOLIN HFA) 108 (90 BASE) MCG/ACT inhaler Inhale 2 puffs into the lungs every 6 (six) hours as needed for wheezing.  Marland Kitchen aspirin 81 MG tablet Take 81 mg by mouth at bedtime.   Marland Kitchen BIOTIN PO Take 1 tablet by mouth at bedtime.   . Calcium Carbonate-Vitamin D (CALCIUM 600 + D PO) Take 1,200 mg by mouth at bedtime.   . Cholecalciferol (VITAMIN D) 2000 UNITS CAPS Take 1 capsule by mouth at bedtime.   . cyanocobalamin (,VITAMIN B-12,) 1000 MCG/ML injection Inject 1 mL (1,000 mcg total) into the muscle every 30 (thirty) days.  . furosemide (LASIX) 40 MG tablet Take 1 tablet (40 mg total) by mouth daily.  Marland Kitchen ipratropium-albuterol (DUONEB) 0.5-2.5 (3) MG/3ML SOLN Take 3 mLs by nebulization every 6 (six) hours as needed.  . lovastatin (MEVACOR) 40 MG tablet take 1 tablet by mouth at bedtime  . Miconazole Nitrate 2 % POWD 1 application by Does not apply route daily as needed (for skin). Apply to stomach and under breasts  . montelukast (SINGULAIR) 10 MG tablet take 1 tablet by  mouth at bedtime  . pantoprazole (PROTONIX) 40 MG tablet Take 1 tablet (40 mg total) by mouth at bedtime.  . polyethylene glycol (MIRALAX / GLYCOLAX) packet Take 17 g by mouth daily.  . potassium chloride (K-DUR,KLOR-CON) 10 MEQ tablet Take 2 tablets (20 mEq total) by mouth daily.  Marland Kitchen SPIRIVA HANDIHALER 18 MCG inhalation capsule inhale the contents of one capsule in the handihaler once daily  . traMADol (ULTRAM) 50 MG tablet Take 50 mg by mouth every 6 (six) hours as needed for moderate pain.  . traZODone (DESYREL) 50 MG tablet Take 0.5-1 tablets (25-50 mg total) by mouth at bedtime as needed for sleep.   No current facility-administered medications on file prior to visit.    Review of Systems Per HPI unless specifically indicated above     Objective:    BP 142/78 mmHg  Pulse 68  Temp(Src) 97.8 F (36.6 C) (Oral)  Wt 216 lb 12 oz (98.317 kg)  Wt Readings from Last 3 Encounters:  03/16/15 216 lb 12 oz (98.317 kg)  03/13/15 214 lb (97.07 kg)  02/25/15 210 lb (95.255 kg)    Physical Exam  Constitutional: She appears well-developed and well-nourished. No distress.  HENT:  Mouth/Throat: Oropharynx is clear and moist. No oropharyngeal exudate.  Cardiovascular: Normal rate, regular rhythm and intact distal pulses.   Murmur (4/6 systolic murmur present) heard. Pulmonary/Chest: Effort normal. No respiratory distress. She has wheezes (  exp wheeze noted). She has no rales.  Abdominal: Soft. Bowel sounds are normal. She exhibits no distension and no mass. There is no hepatosplenomegaly. There is no tenderness. There is no rebound, no guarding and no CVA tenderness.  Musculoskeletal: She exhibits no edema.  Skin: Skin is warm.  Psychiatric: She has a normal mood and affect.  Nursing note and vitals reviewed.  Results for orders placed or performed in visit on 03/16/15  POCT Urinalysis Dipstick  Result Value Ref Range   Color, UA Straw    Clarity, UA Clear    Glucose, UA Negative     Bilirubin, UA Negative    Ketones, UA Negative    Spec Grav, UA 1.020    Blood, UA Negative    pH, UA 6.0    Protein, UA Negative    Urobilinogen, UA 0.2    Nitrite, UA Negative    Leukocytes, UA Negative    *Note: Due to a large number of results and/or encounters for the requested time period, some results have not been displayed. A complete set of results can be found in Results Review.      Assessment & Plan:   Problem List Items Addressed This Visit    Chronic insomnia    Trazodone 50mg  nightly ineffective - will increase to 100mg  nightly. Consider silenor.      Urinary urgency - Primary    Exam today benign, UA today normal. Continue to monitor. rec increased fluids and avoid bladder irritants. Update if sxs persist or worsen.      Relevant Orders   POCT Urinalysis Dipstick (Completed)       Follow up plan: Return if symptoms worsen or fail to improve.

## 2015-03-16 NOTE — Assessment & Plan Note (Signed)
Exam today benign, UA today normal. Continue to monitor. rec increased fluids and avoid bladder irritants. Update if sxs persist or worsen.

## 2015-03-16 NOTE — Patient Instructions (Addendum)
Let's continue trazodone 50mg  nightly for 1 more week if not effective, increase to 2 tablet nightly (100mg ) for 2-3 weeks and update me with effect. Urine looking ok today. Drink plenty of water and avoid bladder irritants like caffeine.  Let us know if any persistent or new symptoms.  Good to see you today, call us with questions.

## 2015-03-16 NOTE — Progress Notes (Signed)
Pre visit review using our clinic review tool, if applicable. No additional management support is needed unless otherwise documented below in the visit note. 

## 2015-03-16 NOTE — Assessment & Plan Note (Signed)
Trazodone 50mg  nightly ineffective - will increase to 100mg  nightly. Consider silenor.

## 2015-03-19 ENCOUNTER — Encounter (HOSPITAL_COMMUNITY)
Admission: RE | Admit: 2015-03-19 | Discharge: 2015-03-19 | Disposition: A | Discharge status: Home / Self Care | Payer: Self-pay | Source: Ambulatory Visit | Attending: Internal Medicine | Admitting: Internal Medicine

## 2015-03-24 ENCOUNTER — Encounter (HOSPITAL_COMMUNITY)
Admission: RE | Admit: 2015-03-24 | Discharge: 2015-03-24 | Disposition: A | Discharge status: Home / Self Care | Payer: Self-pay | Source: Ambulatory Visit | Attending: Internal Medicine | Admitting: Internal Medicine

## 2015-03-26 ENCOUNTER — Other Ambulatory Visit: Payer: Self-pay | Admitting: Internal Medicine

## 2015-03-26 ENCOUNTER — Encounter (HOSPITAL_COMMUNITY)
Admission: RE | Admit: 2015-03-26 | Discharge: 2015-03-26 | Disposition: A | Discharge status: Home / Self Care | Payer: Self-pay | Source: Ambulatory Visit | Attending: Internal Medicine | Admitting: Internal Medicine

## 2015-03-31 ENCOUNTER — Encounter (HOSPITAL_COMMUNITY)
Admission: RE | Admit: 2015-03-31 | Discharge: 2015-03-31 | Disposition: A | Discharge status: Home / Self Care | Payer: Self-pay | Source: Ambulatory Visit | Attending: Internal Medicine | Admitting: Internal Medicine

## 2015-04-01 ENCOUNTER — Ambulatory Visit (INDEPENDENT_AMBULATORY_CARE_PROVIDER_SITE_OTHER): Payer: Medicare Other

## 2015-04-01 ENCOUNTER — Ambulatory Visit: Payer: Medicare Other

## 2015-04-01 DIAGNOSIS — E538 Deficiency of other specified B group vitamins: Secondary | ICD-10-CM | POA: Diagnosis not present

## 2015-04-01 MED ORDER — CYANOCOBALAMIN 1000 MCG/ML IJ SOLN
1000.0000 ug | Freq: Once | INTRAMUSCULAR | Status: AC
  Administered 2015-04-01: 1000 ug via INTRAMUSCULAR

## 2015-04-02 ENCOUNTER — Encounter (HOSPITAL_COMMUNITY)
Admission: RE | Admit: 2015-04-02 | Discharge: 2015-04-02 | Disposition: A | Discharge status: Home / Self Care | Payer: Self-pay | Source: Ambulatory Visit | Attending: Internal Medicine | Admitting: Internal Medicine

## 2015-04-07 ENCOUNTER — Encounter (HOSPITAL_COMMUNITY): Payer: Self-pay

## 2015-04-07 ENCOUNTER — Encounter: Payer: Self-pay | Admitting: Family Medicine

## 2015-04-08 ENCOUNTER — Other Ambulatory Visit: Payer: 59

## 2015-04-09 ENCOUNTER — Encounter (HOSPITAL_COMMUNITY): Payer: Self-pay

## 2015-04-14 ENCOUNTER — Other Ambulatory Visit: Payer: Medicare Other

## 2015-04-14 ENCOUNTER — Encounter (HOSPITAL_COMMUNITY): Payer: Self-pay | Attending: Internal Medicine

## 2015-04-14 DIAGNOSIS — Z954 Presence of other heart-valve replacement: Secondary | ICD-10-CM | POA: Insufficient documentation

## 2015-04-14 DIAGNOSIS — R0600 Dyspnea, unspecified: Secondary | ICD-10-CM | POA: Insufficient documentation

## 2015-04-15 ENCOUNTER — Ambulatory Visit: Payer: 59 | Admitting: Family Medicine

## 2015-04-15 ENCOUNTER — Telehealth: Payer: Self-pay | Admitting: Internal Medicine

## 2015-04-15 NOTE — Telephone Encounter (Signed)
Reminder to call [name] - husband died in [place]

## 2015-04-15 NOTE — Telephone Encounter (Signed)
Call [name]

## 2015-04-16 ENCOUNTER — Encounter (HOSPITAL_COMMUNITY): Payer: Self-pay

## 2015-04-16 ENCOUNTER — Encounter: Payer: Self-pay | Admitting: Family Medicine

## 2015-04-17 MED ORDER — TRAZODONE HCL 50 MG PO TABS: 100.0000 mg | ORAL_TABLET | Freq: Every evening | ORAL | Status: DC | PRN

## 2015-04-21 ENCOUNTER — Encounter (HOSPITAL_COMMUNITY): Payer: Self-pay

## 2015-04-23 ENCOUNTER — Encounter (HOSPITAL_COMMUNITY): Payer: Self-pay

## 2015-04-28 ENCOUNTER — Encounter (HOSPITAL_COMMUNITY): Admission: RE | Admit: 2015-04-28 | Payer: Self-pay | Source: Ambulatory Visit

## 2015-04-30 ENCOUNTER — Encounter (HOSPITAL_COMMUNITY): Payer: Self-pay

## 2015-05-05 ENCOUNTER — Encounter (HOSPITAL_COMMUNITY): Payer: Self-pay

## 2015-05-06 ENCOUNTER — Ambulatory Visit (INDEPENDENT_AMBULATORY_CARE_PROVIDER_SITE_OTHER): Payer: Medicare Other | Admitting: *Deleted

## 2015-05-06 DIAGNOSIS — E538 Deficiency of other specified B group vitamins: Secondary | ICD-10-CM | POA: Diagnosis not present

## 2015-05-06 MED ORDER — CYANOCOBALAMIN 1000 MCG/ML IJ SOLN
1000.0000 ug | Freq: Once | INTRAMUSCULAR | Status: AC
  Administered 2015-05-06: 1000 ug via INTRAMUSCULAR

## 2015-05-07 ENCOUNTER — Encounter (HOSPITAL_COMMUNITY): Payer: Self-pay

## 2015-05-12 ENCOUNTER — Encounter (HOSPITAL_COMMUNITY): Payer: Self-pay

## 2015-05-12 ENCOUNTER — Encounter (HOSPITAL_COMMUNITY)
Admission: RE | Admit: 2015-05-12 | Discharge: 2015-05-12 | Disposition: A | Discharge status: Home / Self Care | Payer: Self-pay | Source: Ambulatory Visit | Attending: Internal Medicine | Admitting: Internal Medicine

## 2015-05-12 DIAGNOSIS — Z954 Presence of other heart-valve replacement: Secondary | ICD-10-CM | POA: Insufficient documentation

## 2015-05-12 DIAGNOSIS — R0600 Dyspnea, unspecified: Secondary | ICD-10-CM | POA: Insufficient documentation

## 2015-05-14 ENCOUNTER — Encounter (HOSPITAL_COMMUNITY): Payer: Self-pay

## 2015-05-14 ENCOUNTER — Encounter (HOSPITAL_COMMUNITY)
Admission: RE | Admit: 2015-05-14 | Discharge: 2015-05-14 | Disposition: A | Discharge status: Home / Self Care | Payer: Self-pay | Source: Ambulatory Visit | Attending: Internal Medicine | Admitting: Internal Medicine

## 2015-05-19 ENCOUNTER — Encounter (HOSPITAL_COMMUNITY): Payer: Self-pay

## 2015-05-21 ENCOUNTER — Encounter (HOSPITAL_COMMUNITY): Payer: Self-pay

## 2015-05-21 ENCOUNTER — Encounter (HOSPITAL_COMMUNITY)
Admission: RE | Admit: 2015-05-21 | Discharge: 2015-05-21 | Disposition: A | Discharge status: Home / Self Care | Payer: Self-pay | Source: Ambulatory Visit | Attending: Internal Medicine | Admitting: Internal Medicine

## 2015-05-26 ENCOUNTER — Encounter (HOSPITAL_COMMUNITY)
Admission: RE | Admit: 2015-05-26 | Discharge: 2015-05-26 | Disposition: A | Discharge status: Home / Self Care | Payer: Self-pay | Source: Ambulatory Visit | Attending: Internal Medicine | Admitting: Internal Medicine

## 2015-05-26 ENCOUNTER — Encounter (HOSPITAL_COMMUNITY): Payer: Self-pay

## 2015-05-28 ENCOUNTER — Encounter (HOSPITAL_COMMUNITY): Payer: Self-pay

## 2015-05-28 ENCOUNTER — Encounter (HOSPITAL_COMMUNITY)
Admission: RE | Admit: 2015-05-28 | Discharge: 2015-05-28 | Disposition: A | Discharge status: Home / Self Care | Payer: Self-pay | Source: Ambulatory Visit | Attending: Internal Medicine | Admitting: Internal Medicine

## 2015-06-02 ENCOUNTER — Encounter (HOSPITAL_COMMUNITY): Payer: Self-pay

## 2015-06-04 ENCOUNTER — Encounter (HOSPITAL_COMMUNITY): Payer: Self-pay

## 2015-06-04 ENCOUNTER — Encounter (HOSPITAL_COMMUNITY)
Admission: RE | Admit: 2015-06-04 | Discharge: 2015-06-04 | Disposition: A | Discharge status: Home / Self Care | Payer: Self-pay | Source: Ambulatory Visit | Attending: Internal Medicine | Admitting: Internal Medicine

## 2015-06-04 ENCOUNTER — Encounter: Payer: Self-pay | Admitting: Family Medicine

## 2015-06-04 ENCOUNTER — Ambulatory Visit (INDEPENDENT_AMBULATORY_CARE_PROVIDER_SITE_OTHER): Payer: Medicare Other | Admitting: Family Medicine

## 2015-06-04 VITALS — BP 122/64 | HR 74 | Temp 98.0°F | Wt 216.0 lb

## 2015-06-04 DIAGNOSIS — F4321 Adjustment disorder with depressed mood: Secondary | ICD-10-CM

## 2015-06-04 DIAGNOSIS — F5104 Psychophysiologic insomnia: Secondary | ICD-10-CM

## 2015-06-04 DIAGNOSIS — R0609 Other forms of dyspnea: Secondary | ICD-10-CM | POA: Diagnosis not present

## 2015-06-04 DIAGNOSIS — G4700 Insomnia, unspecified: Secondary | ICD-10-CM

## 2015-06-04 DIAGNOSIS — R0600 Dyspnea, unspecified: Secondary | ICD-10-CM

## 2015-06-04 MED ORDER — SERTRALINE HCL 25 MG PO TABS: 25.0000 mg | ORAL_TABLET | Freq: Every day | ORAL | Status: DC

## 2015-06-04 NOTE — Progress Notes (Signed)
BP 122/64 mmHg  Pulse 74  Temp(Src) 98 F (36.7 C) (Oral)  Wt 216 lb (97.977 kg)  SpO2 97%   CC: persistent dyspnea  Subjective:    Patient ID: [name], female    DOB: 1946/08/04, 69 y.o.   MRN: [id number]  HPI: [name] is a 69 y.o. female presenting on 06/04/2015 for Acute Visit   Concerned with persistent dyspnea on exertion along with palpitations which quickly improves with rest. 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 Has seen cardiology and pulmonology. She underwent cardiopulmonary stress test 06/2014 revealing deconditioning and obesity as cause of dyspnea (stable heart and lung function). Restarted pulm rehab this month. O2 level at therapy stays fine. Doesn't notice any improvement in stamina despite rehab. No shortness of breath with recumbent bike or with stepper, just with weight bearing exercise such as any amount of walking. No back pain. No coughing, no fever, no congestion. Regular with COPD meds (spiriva and breo and prn albuterol inh). Pending f/u with pulm.  Also noticing intermittent buzzing vibration LUQ not uncomfortable, present with resting.   Husband died after sudden MVA 07-May-2015.  Lacks motivation to get things done. Feels down/depressed at times but no SI/HI. Has not been previously interested in pharmacotherapy.  Persistent insomnia. Trazodone 150mg  caused palpitations. No SI/HI.   Recent cholecystectomy for chronic cholecystitis with calculus, COPD exacerbations, UTI over last few months.   Relevant past medical, surgical, family and social history reviewed and updated as indicated. Interim medical history since our last visit reviewed. Allergies and medications reviewed and updated. Current Outpatient Prescriptions on File Prior to Visit  Medication Sig  . albuterol (PROVENTIL HFA;VENTOLIN HFA) 108 (90 BASE) MCG/ACT inhaler Inhale 2 puffs into the lungs every 6 (six) hours as needed for wheezing.  Marland Kitchen aspirin 81  MG tablet Take 81 mg by mouth at bedtime.   Marland Kitchen BIOTIN PO Take 1 tablet by mouth at bedtime.   . Calcium Carbonate-Vitamin D (CALCIUM 600 + D PO) Take 1,200 mg by mouth at bedtime.   . Cholecalciferol (VITAMIN D) 2000 UNITS CAPS Take 1 capsule by mouth at bedtime.   . cyanocobalamin (,VITAMIN B-12,) 1000 MCG/ML injection Inject 1 mL (1,000 mcg total) into the muscle every 30 (thirty) days.  . Fluticasone Furoate-Vilanterol (BREO ELLIPTA) 200-25 MCG/INH AEPB Inhale 1 puff into the lungs daily.  . furosemide (LASIX) 40 MG tablet Take 1 tablet (40 mg total) by mouth daily.  Marland Kitchen ipratropium-albuterol (DUONEB) 0.5-2.5 (3) MG/3ML SOLN Take 3 mLs by nebulization every 6 (six) hours as needed.  . lovastatin (MEVACOR) 40 MG tablet take 1 tablet by mouth at bedtime  . Miconazole Nitrate 2 % POWD 1 application by Does not apply route daily as needed (for skin). Apply to stomach and under breasts  . montelukast (SINGULAIR) 10 MG tablet take 1 tablet by mouth at bedtime  . pantoprazole (PROTONIX) 40 MG tablet Take 1 tablet (40 mg total) by mouth at bedtime.  . polyethylene glycol (MIRALAX / GLYCOLAX) packet Take 17 g by mouth daily.  . potassium chloride (K-DUR,KLOR-CON) 10 MEQ tablet Take 2 tablets (20 mEq total) by mouth daily.  Marland Kitchen SPIRIVA HANDIHALER 18 MCG inhalation capsule inhale the contents of one capsule in the handihaler once daily  . traMADol (ULTRAM) 50 MG tablet Take 50 mg by mouth every 6 (six) hours as needed for moderate pain.  . traZODone (DESYREL) 50 MG tablet Take 2 tablets (100 mg total) by  mouth at bedtime as needed for sleep.   No current facility-administered medications on file prior to visit.    Review of Systems Per HPI unless specifically indicated above     Objective:    BP 122/64 mmHg  Pulse 74  Temp(Src) 98 F (36.7 C) (Oral)  Wt 216 lb (97.977 kg)  SpO2 97%  Wt Readings from Last 3 Encounters:  06/04/15 216 lb (97.977 kg)  03/16/15 216 lb 12 oz (98.317 kg)  03/13/15  214 lb (97.07 kg)   Body mass index is 34.88 kg/(m^2).  Physical Exam  Constitutional: She appears well-developed and well-nourished. No distress.  HENT:  Mouth/Throat: Oropharynx is clear and moist. No oropharyngeal exudate.  Cardiovascular: Normal rate, regular rhythm and intact distal pulses.   Murmur (3/6 systolic murmur) heard. Pulmonary/Chest: Effort normal. No respiratory distress. She has wheezes (few exp wheezing). She has no rales.  Musculoskeletal: She exhibits no edema.  Skin: Skin is warm and dry. No rash noted.  Psychiatric: Her behavior is normal. She exhibits a depressed mood.  Nursing note and vitals reviewed.  Maintains O2 sat >95% during ambulatory pulse ox today.  Lab Results  Component Value Date   [id number] 191* 02/25/2015       Assessment & Plan:   Problem List Items Addressed This Visit    Grieving    Persistent trouble with insomnia and depressed mood in setting of grieving. I suggested trial of pharmacotherapy for occasionally overwhelming depression. Start low dose sertraline 25mg  daily. Assess effect at f/u next month. Pt agrees with plan. Continue trazodone 100mg  nightly.       Chronic insomnia    See above. Treat possible depression component and reassess Continue trazodone 100mg  nightly.      Dyspnea on exertion - Primary    Unclear cause - but recent cardiopulmonary stress test reassuringly ok. Pulm rehab hasn't helped as would be anticipated. Normal ambulatory pulse ox today. Discussed with patient. Interestingly predominance of symptoms are after weight bearing (no dyspnea with recumbent bike use). No signs of fluid overload today, weight stable. Continue to monitor.          Follow up plan: Return if symptoms worsen or fail to improve.

## 2015-06-04 NOTE — Progress Notes (Signed)
Pre visit review using our clinic review tool, if applicable. No additional management support is needed unless otherwise documented below in the visit note. 

## 2015-06-04 NOTE — Patient Instructions (Signed)
Ambulatory pulse ox today Start sertraline 25mg  daily Keep next appointment Get pulm input on shortness of breath only with weight bearing exercise

## 2015-06-05 ENCOUNTER — Encounter: Payer: Self-pay | Admitting: Family Medicine

## 2015-06-05 DIAGNOSIS — R0609 Other forms of dyspnea: Secondary | ICD-10-CM

## 2015-06-05 DIAGNOSIS — R0600 Dyspnea, unspecified: Secondary | ICD-10-CM | POA: Insufficient documentation

## 2015-06-05 HISTORY — DX: Other forms of dyspnea: R06.09

## 2015-06-05 NOTE — Assessment & Plan Note (Signed)
Unclear cause - but recent cardiopulmonary stress test reassuringly ok. Pulm rehab hasn't helped as would be anticipated. Normal ambulatory pulse ox today. Discussed with patient. Interestingly predominance of symptoms are after weight bearing (no dyspnea with recumbent bike use). No signs of fluid overload today, weight stable. Continue to monitor.

## 2015-06-05 NOTE — Assessment & Plan Note (Signed)
Persistent trouble with insomnia and depressed mood in setting of grieving. I suggested trial of pharmacotherapy for occasionally overwhelming depression. Start low dose sertraline 25mg  daily. Assess effect at f/u next month. Pt agrees with plan. Continue trazodone 100mg  nightly.

## 2015-06-05 NOTE — Assessment & Plan Note (Addendum)
See above. Treat possible depression component and reassess Continue trazodone 100mg  nightly.

## 2015-06-09 ENCOUNTER — Encounter (HOSPITAL_COMMUNITY): Payer: Self-pay

## 2015-06-09 ENCOUNTER — Encounter (HOSPITAL_COMMUNITY)
Admission: RE | Admit: 2015-06-09 | Discharge: 2015-06-09 | Disposition: A | Discharge status: Home / Self Care | Payer: Self-pay | Source: Ambulatory Visit | Attending: Internal Medicine | Admitting: Internal Medicine

## 2015-06-11 ENCOUNTER — Encounter (HOSPITAL_COMMUNITY)
Admission: RE | Admit: 2015-06-11 | Discharge: 2015-06-11 | Disposition: A | Discharge status: Home / Self Care | Payer: Self-pay | Source: Ambulatory Visit | Attending: Internal Medicine | Admitting: Internal Medicine

## 2015-06-11 ENCOUNTER — Encounter (HOSPITAL_COMMUNITY): Payer: Self-pay

## 2015-06-11 DIAGNOSIS — Z954 Presence of other heart-valve replacement: Secondary | ICD-10-CM | POA: Insufficient documentation

## 2015-06-11 DIAGNOSIS — R0600 Dyspnea, unspecified: Secondary | ICD-10-CM | POA: Insufficient documentation

## 2015-06-16 ENCOUNTER — Encounter (HOSPITAL_COMMUNITY)
Admission: RE | Admit: 2015-06-16 | Discharge: 2015-06-16 | Disposition: A | Discharge status: Home / Self Care | Payer: Self-pay | Source: Ambulatory Visit | Attending: Internal Medicine | Admitting: Internal Medicine

## 2015-06-16 ENCOUNTER — Encounter (HOSPITAL_COMMUNITY): Payer: Self-pay

## 2015-06-18 ENCOUNTER — Encounter (HOSPITAL_COMMUNITY)
Admission: RE | Admit: 2015-06-18 | Discharge: 2015-06-18 | Disposition: A | Discharge status: Home / Self Care | Payer: Self-pay | Source: Ambulatory Visit | Attending: Internal Medicine | Admitting: Internal Medicine

## 2015-06-18 ENCOUNTER — Encounter (HOSPITAL_COMMUNITY): Payer: Self-pay

## 2015-06-23 ENCOUNTER — Encounter (HOSPITAL_COMMUNITY)
Admission: RE | Admit: 2015-06-23 | Discharge: 2015-06-23 | Disposition: A | Discharge status: Home / Self Care | Payer: Self-pay | Source: Ambulatory Visit | Attending: Internal Medicine | Admitting: Internal Medicine

## 2015-06-23 ENCOUNTER — Encounter (HOSPITAL_COMMUNITY): Payer: Self-pay

## 2015-06-24 ENCOUNTER — Other Ambulatory Visit: Payer: Self-pay | Admitting: Family Medicine

## 2015-06-24 DIAGNOSIS — E785 Hyperlipidemia, unspecified: Secondary | ICD-10-CM

## 2015-06-24 DIAGNOSIS — E538 Deficiency of other specified B group vitamins: Secondary | ICD-10-CM

## 2015-06-24 DIAGNOSIS — I5032 Chronic diastolic (congestive) heart failure: Secondary | ICD-10-CM

## 2015-06-24 DIAGNOSIS — R0609 Other forms of dyspnea: Secondary | ICD-10-CM

## 2015-06-24 DIAGNOSIS — N289 Disorder of kidney and ureter, unspecified: Secondary | ICD-10-CM

## 2015-06-24 DIAGNOSIS — R0600 Dyspnea, unspecified: Secondary | ICD-10-CM

## 2015-06-25 ENCOUNTER — Encounter (HOSPITAL_COMMUNITY): Payer: Self-pay

## 2015-06-26 ENCOUNTER — Other Ambulatory Visit (INDEPENDENT_AMBULATORY_CARE_PROVIDER_SITE_OTHER): Payer: Medicare Other

## 2015-06-26 DIAGNOSIS — R0600 Dyspnea, unspecified: Secondary | ICD-10-CM

## 2015-06-26 DIAGNOSIS — E785 Hyperlipidemia, unspecified: Secondary | ICD-10-CM

## 2015-06-26 DIAGNOSIS — E538 Deficiency of other specified B group vitamins: Secondary | ICD-10-CM | POA: Diagnosis not present

## 2015-06-26 DIAGNOSIS — N289 Disorder of kidney and ureter, unspecified: Secondary | ICD-10-CM | POA: Diagnosis not present

## 2015-06-26 DIAGNOSIS — I5032 Chronic diastolic (congestive) heart failure: Secondary | ICD-10-CM

## 2015-06-26 DIAGNOSIS — R0609 Other forms of dyspnea: Secondary | ICD-10-CM | POA: Diagnosis not present

## 2015-06-26 LAB — RENAL FUNCTION PANEL
Albumin: 3.8 g/dL (ref 3.5–5.2)
BUN: 16 mg/dL (ref 6–23)
CO2: 31 mEq/L (ref 19–32)
Calcium: 9.8 mg/dL (ref 8.4–10.5)
Chloride: 103 mEq/L (ref 96–112)
Creatinine, Ser: 1.27 mg/dL — ABNORMAL HIGH (ref 0.40–1.20)
GFR: 44.22 mL/min — ABNORMAL LOW (ref >60.00)
Glucose, Bld: 87 mg/dL (ref 70–99)
Phosphorus: 3.5 mg/dL (ref 2.3–4.6)
Potassium: 4.2 mEq/L (ref 3.5–5.1)
Sodium: 142 mEq/L (ref 135–145)

## 2015-06-26 LAB — LDL CHOLESTEROL, DIRECT: Direct LDL: 111 mg/dL

## 2015-06-26 LAB — BRAIN NATRIURETIC PEPTIDE: Pro B Natriuretic peptide (BNP): 204 pg/mL — ABNORMAL HIGH (ref 0.0–100.0)

## 2015-06-26 LAB — VITAMIN B12: Vitamin B-12: 276 pg/mL (ref 211–911)

## 2015-06-30 ENCOUNTER — Encounter (HOSPITAL_COMMUNITY): Payer: Self-pay

## 2015-06-30 ENCOUNTER — Encounter (HOSPITAL_COMMUNITY)
Admission: RE | Admit: 2015-06-30 | Discharge: 2015-06-30 | Disposition: A | Discharge status: Home / Self Care | Payer: Self-pay | Source: Ambulatory Visit | Attending: Internal Medicine | Admitting: Internal Medicine

## 2015-07-01 ENCOUNTER — Ambulatory Visit: Payer: Medicare Other | Admitting: Family Medicine

## 2015-07-02 ENCOUNTER — Encounter (HOSPITAL_COMMUNITY): Payer: Self-pay

## 2015-07-02 ENCOUNTER — Encounter (HOSPITAL_COMMUNITY)
Admission: RE | Admit: 2015-07-02 | Discharge: 2015-07-02 | Disposition: A | Discharge status: Home / Self Care | Payer: Self-pay | Source: Ambulatory Visit | Attending: Internal Medicine | Admitting: Internal Medicine

## 2015-07-03 ENCOUNTER — Telehealth: Payer: Self-pay

## 2015-07-03 ENCOUNTER — Encounter: Payer: Self-pay | Admitting: Family Medicine

## 2015-07-03 ENCOUNTER — Ambulatory Visit (INDEPENDENT_AMBULATORY_CARE_PROVIDER_SITE_OTHER): Payer: Medicare Other | Admitting: Family Medicine

## 2015-07-03 VITALS — BP 140/80 | HR 84 | Temp 97.8°F | Wt 210.0 lb

## 2015-07-03 DIAGNOSIS — N182 Chronic kidney disease, stage 2 (mild): Secondary | ICD-10-CM

## 2015-07-03 DIAGNOSIS — E538 Deficiency of other specified B group vitamins: Secondary | ICD-10-CM | POA: Diagnosis not present

## 2015-07-03 DIAGNOSIS — J4540 Moderate persistent asthma, uncomplicated: Secondary | ICD-10-CM

## 2015-07-03 DIAGNOSIS — R0600 Dyspnea, unspecified: Secondary | ICD-10-CM

## 2015-07-03 DIAGNOSIS — N3946 Mixed incontinence: Secondary | ICD-10-CM | POA: Diagnosis not present

## 2015-07-03 DIAGNOSIS — F4321 Adjustment disorder with depressed mood: Secondary | ICD-10-CM

## 2015-07-03 DIAGNOSIS — J449 Chronic obstructive pulmonary disease, unspecified: Secondary | ICD-10-CM

## 2015-07-03 DIAGNOSIS — R0609 Other forms of dyspnea: Secondary | ICD-10-CM

## 2015-07-03 DIAGNOSIS — G4700 Insomnia, unspecified: Secondary | ICD-10-CM

## 2015-07-03 DIAGNOSIS — N183 Chronic kidney disease, stage 3 (moderate): Secondary | ICD-10-CM

## 2015-07-03 DIAGNOSIS — N289 Disorder of kidney and ureter, unspecified: Secondary | ICD-10-CM

## 2015-07-03 DIAGNOSIS — F5104 Psychophysiologic insomnia: Secondary | ICD-10-CM

## 2015-07-03 HISTORY — DX: Chronic kidney disease, stage 2 (mild): N18.2

## 2015-07-03 MED ORDER — CYANOCOBALAMIN 1000 MCG/ML IJ SOLN
1000.0000 ug | Freq: Once | INTRAMUSCULAR | Status: AC
  Administered 2015-07-03: 1000 ug via INTRAMUSCULAR

## 2015-07-03 MED ORDER — OXYBUTYNIN CHLORIDE 5 MG PO TABS: 5.0000 mg | ORAL_TABLET | Freq: Two times a day (BID) | ORAL | Status: DC | PRN

## 2015-07-03 NOTE — Progress Notes (Signed)
Pre visit review using our clinic review tool, if applicable. No additional management support is needed unless otherwise documented below in the visit note. 

## 2015-07-03 NOTE — Assessment & Plan Note (Signed)
Continue trazodone for insomnia, suggested decrease dose to 75mg  nightly to see if am nausea improves.

## 2015-07-03 NOTE — Assessment & Plan Note (Signed)
Mood improved on low dose sertraline 25mg  - continue. Pt declines higher dose.

## 2015-07-03 NOTE — Progress Notes (Signed)
BP 140/80 mmHg  Pulse 84  Temp(Src) 97.8 F (36.6 C)  Wt 210 lb (95.255 kg)  SpO2 97%   CC: 6 mo f/u visit  Subjective:    Patient ID: [name], female    DOB: [birth year], 69 y.o.   MRN: [id number]  HPI: [name] is a 69 y.o. female presenting on 07/03/2015 for Follow-up   Dyspnea on exertion - chronic issue. No significant wheezing or cough. Normal amb pulse ox. Cardiopulmonary stress test last year reassuringly ok. She endorses tachycardia with any exertion.   Husband died after sudden MVA April 05, 2015.  Lacks motivation to get things done. Feels down/depressed at times but no SI/HI. Persistent insomnia. Trazodone 150mg  caused palpitations. Down to 100mg  trazodone nightly. Last visit we started sertraline 25mg  daily. Feels it helps control her emotions - less prone to crying.   Urinary incontinence urge - worse with lasix. Notices main trouble when she first stands up. Some stress incontinence issues as well for years. No dysuria, hematuria, abd pain or flank pain or fevers/chills. Some incomplete bladder emptying. Has not tried any meds for this previously. Previously referred to pelvic floor PT - she has been doing kegel exercise. Has never been on medication for bladder. No h/o pelvic surgeries.  Relevant past medical, surgical, family and social history reviewed and updated as indicated. Interim medical history since our last visit reviewed. Allergies and medications reviewed and updated. Current Outpatient Prescriptions on File Prior to Visit  Medication Sig  . albuterol (PROVENTIL HFA;VENTOLIN HFA) 108 (90 BASE) MCG/ACT inhaler Inhale 2 puffs into the lungs every 6 (six) hours as needed for wheezing.  Marland Kitchen aspirin 81 MG tablet Take 81 mg by mouth at bedtime.   Marland Kitchen BIOTIN PO Take 1 tablet by mouth at bedtime.   . Calcium Carbonate-Vitamin D (CALCIUM 600 + D PO) Take 1,200 mg by mouth at bedtime.   . Cholecalciferol (VITAMIN D) 2000 UNITS CAPS Take 1 capsule by mouth at  bedtime.   . cyanocobalamin (,VITAMIN B-12,) 1000 MCG/ML injection Inject 1 mL (1,000 mcg total) into the muscle every 30 (thirty) days.  . Fluticasone Furoate-Vilanterol (BREO ELLIPTA) 200-25 MCG/INH AEPB Inhale 1 puff into the lungs daily.  . furosemide (LASIX) 40 MG tablet Take 1 tablet (40 mg total) by mouth daily.  Marland Kitchen ipratropium-albuterol (DUONEB) 0.5-2.5 (3) MG/3ML SOLN Take 3 mLs by nebulization every 6 (six) hours as needed.  . lovastatin (MEVACOR) 40 MG tablet take 1 tablet by mouth at bedtime  . Miconazole Nitrate 2 % POWD 1 application by Does not apply route daily as needed (for skin). Apply to stomach and under breasts  . montelukast (SINGULAIR) 10 MG tablet take 1 tablet by mouth at bedtime  . pantoprazole (PROTONIX) 40 MG tablet Take 1 tablet (40 mg total) by mouth at bedtime.  . polyethylene glycol (MIRALAX / GLYCOLAX) packet Take 17 g by mouth daily.  . potassium chloride (K-DUR,KLOR-CON) 10 MEQ tablet Take 2 tablets (20 mEq total) by mouth daily.  . sertraline (ZOLOFT) 25 MG tablet Take 1 tablet (25 mg total) by mouth daily.  Marland Kitchen SPIRIVA HANDIHALER 18 MCG inhalation capsule inhale the contents of one capsule in the handihaler once daily  . traMADol (ULTRAM) 50 MG tablet Take 50 mg by mouth every 6 (six) hours as needed for moderate pain.  . traZODone (DESYREL) 50 MG tablet Take 2 tablets (100 mg total) by mouth at bedtime as needed for sleep.   No current facility-administered medications on  file prior to visit.    Review of Systems Per HPI unless specifically indicated above     Objective:    BP 140/80 mmHg  Pulse 84  Temp(Src) 97.8 F (36.6 C)  Wt 210 lb (95.255 kg)  SpO2 97%  Wt Readings from Last 3 Encounters:  07/03/15 210 lb (95.255 kg)  06/04/15 216 lb (97.977 kg)  03/16/15 216 lb 12 oz (98.317 kg)    Physical Exam  Constitutional: She appears well-developed and well-nourished. No distress.  HENT:  Mouth/Throat: Oropharynx is clear and moist. No  oropharyngeal exudate.  Cardiovascular: Normal rate, regular rhythm, normal heart sounds and intact distal pulses.   No murmur heard. Pulmonary/Chest: Effort normal. No respiratory distress. She has wheezes (mild). She has no rales.  Psychiatric: She has a normal mood and affect.  Nursing note and vitals reviewed.  Results for orders placed or performed in visit on 06/26/15  Renal function panel  Result Value Ref Range   Sodium 142 135 - 145 mEq/L   Potassium 4.2 3.5 - 5.1 mEq/L   Chloride 103 96 - 112 mEq/L   CO2 31 19 - 32 mEq/L   Calcium 9.8 8.4 - 10.5 mg/dL   Albumin 3.8 3.5 - 5.2 g/dL   BUN 16 6 - 23 mg/dL   Creatinine, Ser 1.27 (H) 0.40 - 1.20 mg/dL   Glucose, Bld 87 70 - 99 mg/dL   Phosphorus 3.5 2.3 - 4.6 mg/dL   GFR 44.22 (L) >60.00 mL/min  Vitamin B12  Result Value Ref Range   Vitamin B-12 276 211 - 911 pg/mL  Brain natriuretic peptide  Result Value Ref Range   Pro B Natriuretic peptide (BNP) 204.0 (H) 0.0 - 100.0 pg/mL  LDL Cholesterol, Direct  Result Value Ref Range   Direct LDL 111.0 mg/dL   *Note: Due to a large number of results and/or encounters for the requested time period, some results have not been displayed. A complete set of results can be found in Results Review.      Assessment & Plan:   Problem List Items Addressed This Visit    Vitamin B12 deficiency    B12 level improving. Continue b12 shots - one today.      Renal insufficiency    Discussed with patient. Merits close following. Recheck at wellness visit.      Mixed stress and urge urinary incontinence    Describes both stress and urge incontinence symptoms. Discussed kegel exercises, scheduling bathroom breaks, and will trial oxybutynin IR      Dyspnea on exertion - Primary    Persistent. Thought due to deconditioning/obesity. Continues pulm rehab. Pt describes tachycardia related dyspnea with any exertion. I wonder if she would benefit from B blocker addition - but given COPD and AS s/p  replacement history I did not start this medication yet.      COPD (chronic obstructive pulmonary disease)    Followed by pulm. Persistent mild wheezing on exam but good oxygenation. Suggested trial albuterol 2 puffs QAM x 3 d to see if any effect on dyspnea (pt has not used rescue inhaler in last 6 months).      Chronic insomnia    Continue trazodone for insomnia, suggested decrease dose to 75mg  nightly to see if am nausea improves.      Asthma    See above.      Adjustment disorder with depressed mood    Mood improved on low dose sertraline 25mg  - continue. Pt declines higher dose.  Follow up plan: Return in about 4 months (around 11/02/2015), or as needed, for medicare wellness visit.

## 2015-07-03 NOTE — Assessment & Plan Note (Signed)
Followed by pulm. Persistent mild wheezing on exam but good oxygenation. Suggested trial albuterol 2 puffs QAM x 3 d to see if any effect on dyspnea (pt has not used rescue inhaler in last 6 months).

## 2015-07-03 NOTE — Assessment & Plan Note (Signed)
See above

## 2015-07-03 NOTE — Patient Instructions (Addendum)
Trial trazodone 1.5 tablets at bedtime to see if nausea gets better. Urine check today. Trial oxybutynin twice daily as needed for urinary incontinence. Continue Kegel exercises regularly, try scheduled bathroom trips throughout the day. Give me an update on effect of medicine. Return in 4 months for medicare wellness visit Nice to see you today, call us with questions. B12 shot today.

## 2015-07-03 NOTE — Assessment & Plan Note (Signed)
B12 level improving. Continue b12 shots - one today.

## 2015-07-03 NOTE — Assessment & Plan Note (Signed)
Persistent. Thought due to deconditioning/obesity. Continues pulm rehab. Pt describes tachycardia related dyspnea with any exertion. I wonder if she would benefit from B blocker addition - but given COPD and AS s/p replacement history I did not start this medication yet.

## 2015-07-03 NOTE — Addendum Note (Signed)
Addended by: Phillip Heal, Latravion Graves A on: 07/03/2015 09:54 AM   Modules accepted: Orders

## 2015-07-03 NOTE — Telephone Encounter (Signed)
Pt said oxybutynin not at [facility]; pt seen earlier today and from note trial oxybutynin bid prn for urinary incontinence. Asked Dr Darnell Level and he said 5 mg IR # 62 x 1. Spoke with pharmacist at [facility] about IR and he said to send in the oxybutynin 5 mg (no IR in computer) and he would know to give immediate release rather than XL.To Dr Darnell Level as Juluis Rainier. Pt notified done.

## 2015-07-03 NOTE — Assessment & Plan Note (Signed)
Discussed with patient. Merits close following. Recheck at wellness visit.

## 2015-07-03 NOTE — Assessment & Plan Note (Signed)
Describes both stress and urge incontinence symptoms. Discussed kegel exercises, scheduling bathroom breaks, and will trial oxybutynin IR

## 2015-07-07 ENCOUNTER — Encounter (HOSPITAL_COMMUNITY): Payer: Self-pay

## 2015-07-09 ENCOUNTER — Encounter (HOSPITAL_COMMUNITY): Payer: Self-pay

## 2015-07-14 ENCOUNTER — Encounter (HOSPITAL_COMMUNITY): Payer: Self-pay

## 2015-07-14 ENCOUNTER — Encounter (HOSPITAL_COMMUNITY)
Admission: RE | Admit: 2015-07-14 | Discharge: 2015-07-14 | Disposition: A | Discharge status: Home / Self Care | Payer: Self-pay | Source: Ambulatory Visit | Attending: Internal Medicine | Admitting: Internal Medicine

## 2015-07-14 DIAGNOSIS — R0600 Dyspnea, unspecified: Secondary | ICD-10-CM | POA: Insufficient documentation

## 2015-07-14 DIAGNOSIS — Z954 Presence of other heart-valve replacement: Secondary | ICD-10-CM | POA: Insufficient documentation

## 2015-07-15 ENCOUNTER — Encounter: Payer: Self-pay | Admitting: Family Medicine

## 2015-07-15 MED ORDER — OXYBUTYNIN CHLORIDE ER 10 MG PO TB24: 10.0000 mg | ORAL_TABLET | Freq: Every day | ORAL | Status: DC

## 2015-07-16 ENCOUNTER — Encounter (HOSPITAL_COMMUNITY): Payer: Self-pay

## 2015-07-21 ENCOUNTER — Encounter (HOSPITAL_COMMUNITY)
Admission: RE | Admit: 2015-07-21 | Discharge: 2015-07-21 | Disposition: A | Discharge status: Home / Self Care | Payer: Self-pay | Source: Ambulatory Visit | Attending: Internal Medicine | Admitting: Internal Medicine

## 2015-07-21 ENCOUNTER — Encounter (HOSPITAL_COMMUNITY): Payer: Self-pay

## 2015-07-23 ENCOUNTER — Encounter (HOSPITAL_COMMUNITY): Payer: Self-pay

## 2015-07-23 ENCOUNTER — Encounter (HOSPITAL_COMMUNITY)
Admission: RE | Admit: 2015-07-23 | Discharge: 2015-07-23 | Disposition: A | Discharge status: Home / Self Care | Payer: Self-pay | Source: Ambulatory Visit | Attending: Internal Medicine | Admitting: Internal Medicine

## 2015-07-28 ENCOUNTER — Encounter (HOSPITAL_COMMUNITY): Payer: Self-pay

## 2015-07-30 ENCOUNTER — Encounter (HOSPITAL_COMMUNITY)
Admission: RE | Admit: 2015-07-30 | Discharge: 2015-07-30 | Disposition: A | Discharge status: Home / Self Care | Payer: Self-pay | Source: Ambulatory Visit | Attending: Internal Medicine | Admitting: Internal Medicine

## 2015-07-30 ENCOUNTER — Encounter (HOSPITAL_COMMUNITY): Payer: Self-pay

## 2015-08-04 ENCOUNTER — Encounter (HOSPITAL_COMMUNITY): Payer: Self-pay

## 2015-08-04 ENCOUNTER — Encounter (HOSPITAL_COMMUNITY)
Admission: RE | Admit: 2015-08-04 | Discharge: 2015-08-04 | Disposition: A | Discharge status: Home / Self Care | Payer: Self-pay | Source: Ambulatory Visit | Attending: Internal Medicine | Admitting: Internal Medicine

## 2015-08-05 ENCOUNTER — Ambulatory Visit (INDEPENDENT_AMBULATORY_CARE_PROVIDER_SITE_OTHER): Payer: Medicare Other | Admitting: *Deleted

## 2015-08-05 DIAGNOSIS — E538 Deficiency of other specified B group vitamins: Secondary | ICD-10-CM

## 2015-08-05 MED ORDER — CYANOCOBALAMIN 1000 MCG/ML IJ SOLN
1000.0000 ug | Freq: Once | INTRAMUSCULAR | Status: AC
  Administered 2015-08-05: 1000 ug via INTRAMUSCULAR

## 2015-08-06 ENCOUNTER — Encounter (HOSPITAL_COMMUNITY): Payer: Self-pay

## 2015-08-11 ENCOUNTER — Encounter (HOSPITAL_COMMUNITY)
Admission: RE | Admit: 2015-08-11 | Discharge: 2015-08-11 | Disposition: A | Discharge status: Home / Self Care | Payer: Self-pay | Source: Ambulatory Visit | Attending: Internal Medicine | Admitting: Internal Medicine

## 2015-08-11 ENCOUNTER — Encounter: Payer: Self-pay | Admitting: Family Medicine

## 2015-08-11 ENCOUNTER — Encounter (HOSPITAL_COMMUNITY): Payer: Self-pay

## 2015-08-11 DIAGNOSIS — R0600 Dyspnea, unspecified: Secondary | ICD-10-CM | POA: Insufficient documentation

## 2015-08-11 DIAGNOSIS — N3946 Mixed incontinence: Secondary | ICD-10-CM

## 2015-08-11 DIAGNOSIS — Z954 Presence of other heart-valve replacement: Secondary | ICD-10-CM | POA: Insufficient documentation

## 2015-08-12 ENCOUNTER — Telehealth: Payer: Self-pay | Admitting: *Deleted

## 2015-08-12 ENCOUNTER — Ambulatory Visit (INDEPENDENT_AMBULATORY_CARE_PROVIDER_SITE_OTHER): Payer: Medicare Other | Admitting: Internal Medicine

## 2015-08-12 ENCOUNTER — Encounter: Payer: Self-pay | Admitting: Internal Medicine

## 2015-08-12 VITALS — BP 138/70 | HR 79 | Ht 66.0 in | Wt 216.0 lb

## 2015-08-12 DIAGNOSIS — J449 Chronic obstructive pulmonary disease, unspecified: Secondary | ICD-10-CM

## 2015-08-12 DIAGNOSIS — R0600 Dyspnea, unspecified: Secondary | ICD-10-CM | POA: Diagnosis not present

## 2015-08-12 MED ORDER — FESOTERODINE FUMARATE ER 4 MG PO TB24: 4.0000 mg | ORAL_TABLET | Freq: Every day | ORAL | Status: DC

## 2015-08-12 NOTE — Telephone Encounter (Signed)
PA for toviaz in your IN box for completion.

## 2015-08-12 NOTE — Telephone Encounter (Signed)
PA faxed. Will await determination. 

## 2015-08-12 NOTE — Progress Notes (Signed)
Subjective:     Patient ID: [name], female   DOB: [birth year], 69 y.o.   MRN: [id number]  HPI    OV 08/12/2015  Chief Complaint  Patient presents with  . Follow-up    Pt states her SOB is at baseline. Pt denies cough and CP/tightness. Pt states she has an increase in stress.    Follow-up COPD. This is routine follow-up. Last follow-up was in spring/summer 2060. In the interim her husband died from a motor vehicle accident. This comes close in the heels of her son dying as well. She is currently very lonely and depressed. She is on Zoloft. Reports that Zoloft is only helped take her tears away but her numbness still remains. She says that she takes a day by day. She is surprised that she is still alive as of NOv 2016. Her grief is intense in the absence of her son and husband. I run into her many times at pulmonary rehabilitation in the interim and she always reports the same history. She appreciates Lissa Merlin support.  Terms of her COPD and shortness of breath. She feels a COPD stable but her shortness of breath that she developed after her heart surgery still persists. She says that it is most intense when she walks and releases so she stops. Dyspnea improves within a second. She does not feel the same amount of dyspnea when she does activities such as throwing or exercise bike or or stairs at pulmonary rehabilitation. She will is the dyspnea is from a heart valve. She is despondent that it'll never be fixed.   Current outpatient prescriptions:  .  albuterol (PROVENTIL HFA;VENTOLIN HFA) 108 (90 BASE) MCG/ACT inhaler, Inhale 2 puffs into the lungs every 6 (six) hours as needed for wheezing., Disp: , Rfl:  .  aspirin 81 MG tablet, Take 81 mg by mouth at bedtime. , Disp: , Rfl:  .  BIOTIN PO, Take 1 tablet by mouth at bedtime. , Disp: , Rfl:  .  Calcium Carbonate-Vitamin D (CALCIUM 600 + D PO), Take 1,200 mg by mouth at bedtime. , Disp: , Rfl:  .  Cholecalciferol (VITAMIN D) 2000 UNITS CAPS,  Take 1 capsule by mouth at bedtime. , Disp: , Rfl:  .  cyanocobalamin (,VITAMIN B-12,) 1000 MCG/ML injection, Inject 1 mL (1,000 mcg total) into the muscle every 30 (thirty) days., Disp: , Rfl:  .  Fluticasone Furoate-Vilanterol (BREO ELLIPTA) 200-25 MCG/INH AEPB, Inhale 1 puff into the lungs daily., Disp: 1 each, Rfl: 6 .  furosemide (LASIX) 40 MG tablet, Take 1 tablet (40 mg total) by mouth daily., Disp: 30 tablet, Rfl: 11 .  ipratropium-albuterol (DUONEB) 0.5-2.5 (3) MG/3ML SOLN, Take 3 mLs by nebulization every 6 (six) hours as needed., Disp: 360 mL, Rfl: 0 .  lovastatin (MEVACOR) 40 MG tablet, take 1 tablet by mouth at bedtime, Disp: 30 tablet, Rfl: 6 .  Miconazole Nitrate 2 % POWD, 1 application by Does not apply route daily as needed (for skin). Apply to stomach and under breasts, Disp: , Rfl:  .  montelukast (SINGULAIR) 10 MG tablet, take 1 tablet by mouth at bedtime, Disp: 30 tablet, Rfl: 5 .  pantoprazole (PROTONIX) 40 MG tablet, Take 1 tablet (40 mg total) by mouth at bedtime., Disp: 30 tablet, Rfl: 11 .  polyethylene glycol (MIRALAX / GLYCOLAX) packet, Take 17 g by mouth daily. (Patient taking differently: Take 17 g by mouth daily as needed. ), Disp: 14 each, Rfl: 0 .  potassium chloride (K-DUR,KLOR-CON)  10 MEQ tablet, Take 2 tablets (20 mEq total) by mouth daily., Disp: 60 tablet, Rfl: 11 .  sertraline (ZOLOFT) 25 MG tablet, Take 1 tablet (25 mg total) by mouth daily., Disp: 30 tablet, Rfl: 3 .  SPIRIVA HANDIHALER 18 MCG inhalation capsule, inhale the contents of one capsule in the handihaler once daily, Disp: 30 capsule, Rfl: 4 .  traMADol (ULTRAM) 50 MG tablet, Take 50 mg by mouth every 6 (six) hours as needed for moderate pain., Disp: , Rfl:  .  traZODone (DESYREL) 50 MG tablet, Take 2 tablets (100 mg total) by mouth at bedtime as needed for sleep., Disp: 60 tablet, Rfl: 3 .  fesoterodine (TOVIAZ) 4 MG TB24 tablet, Take 1 tablet (4 mg total) by mouth daily. (Patient not taking:  Reported on 08/12/2015), Disp: 30 tablet, Rfl: 1     Allergies  Allergen Reactions  . Codeine Nausea Only  . Prednisone Hives    In different places all over the body.  . Sulfonamide Derivatives Nausea Only    Immunization History  Administered Date(s) Administered  . H1N1 09/17/2008  . Influenza Split 07/18/2012  . Influenza Whole 08/10/2005, 06/24/2009, 07/11/2011  . Influenza-Unspecified 07/24/2013, 06/30/2014, 06/30/2015  . Pneumococcal Conjugate-13 06/02/2014  . Pneumococcal Polysaccharide-23 08/10/2005, 04/21/2008, 06/25/2013  . Td 10/11/2005  . Zoster 09/16/2014      Review of Systems Positive for depression and shortness of breath    Objective:   Physical Exam  Constitutional: She is oriented to person, place, and time. She appears well-developed and well-nourished. No distress.  HENT:  Head: Normocephalic and atraumatic.  Right Ear: External ear normal.  Left Ear: External ear normal.  Mouth/Throat: Oropharynx is clear and moist. No oropharyngeal exudate.  Eyes: Conjunctivae and EOM are normal. Pupils are equal, round, and reactive to light. Right eye exhibits no discharge. Left eye exhibits no discharge. No scleral icterus.  Neck: Normal range of motion. Neck supple. No JVD present. No tracheal deviation present. No thyromegaly present.  Cardiovascular: Normal rate, regular rhythm and intact distal pulses.  Exam reveals no gallop and no friction rub.   Murmur heard. Pulmonary/Chest: Effort normal and breath sounds normal. No respiratory distress. She has no wheezes. She has no rales. She exhibits no tenderness.  Abdominal: Soft. Bowel sounds are normal. She exhibits no distension and no mass. There is no tenderness. There is no rebound and no guarding.  Musculoskeletal: Normal range of motion. She exhibits no edema or tenderness.  Lymphadenopathy:    She has no cervical adenopathy.  Neurological: She is alert and oriented to person, place, and time. She has  normal reflexes. No cranial nerve deficit. She exhibits normal muscle tone. Coordination normal.  Skin: Skin is warm and dry. No rash noted. She is not diaphoretic. No erythema. No pallor.  Psychiatric: She has a normal mood and affect. Her behavior is normal. Judgment and thought content normal.  Vitals reviewed.   Filed Vitals:   08/12/15 1459  BP: 138/70  Pulse: 79  Height: 5\' 6"  (1.676 m)  Weight: 216 lb (97.977 kg)  SpO2: 95%        Assessment:       ICD-9-CM ICD-10-CM   1. Chronic obstructive pulmonary disease, unspecified COPD, unspecified chronic bronchitis type 496 J44.9   2. Dyspnea 786.09 R06.00         Plan:     COPD stable Still unclear to me why shortness of breath did not improve much after heart valve surgery  - Glad  to having echocardiogram - follow-up with cardiology Continue Brio, Singulair, DuoNeb as before Glad you up-to-date with the flu shot  Follow-up  3 months or sooner if needed   Dr. Brand Males, [name], [facility].C.P Pulmonary and Critical Care Medicine Staff Physician [facility] Pager: [phone], If no answer or between  15:00h - 7:00h: call [phone]  08/12/2015 3:38 PM       Dr. Brand Males, [name], F.C.C.P Pulmonary and Critical Care Medicine Staff Physician [facility] Pager: [phone], If no answer or between  15:00h - 7:00h: call [phone]  08/12/2015 3:33 PM

## 2015-08-12 NOTE — Telephone Encounter (Signed)
Filled and in Kim's box. 

## 2015-08-12 NOTE — Patient Instructions (Signed)
ICD-9-CM ICD-10-CM   1. Chronic obstructive pulmonary disease, unspecified COPD, unspecified chronic bronchitis type 496 J44.9   2. Dyspnea 786.09 R06.00     COPD stable Still unclear to me why shortness of breath did not improve much after heart valve surgery  - Glad to having echocardiogram - follow-up with cardiology Continue Brio, Singulair, DuoNeb as before Glad you up-to-date with the flu shot  Follow-up  3 months or sooner if needed

## 2015-08-13 ENCOUNTER — Encounter (HOSPITAL_COMMUNITY)
Admission: RE | Admit: 2015-08-13 | Discharge: 2015-08-13 | Disposition: A | Discharge status: Home / Self Care | Payer: Self-pay | Source: Ambulatory Visit | Attending: Internal Medicine | Admitting: Internal Medicine

## 2015-08-13 ENCOUNTER — Encounter (HOSPITAL_COMMUNITY): Payer: Self-pay

## 2015-08-14 ENCOUNTER — Encounter: Payer: Self-pay | Admitting: Family Medicine

## 2015-08-14 ENCOUNTER — Telehealth: Payer: Self-pay | Admitting: *Deleted

## 2015-08-14 NOTE — Telephone Encounter (Signed)
PA required additional info. In your IN box for completion.

## 2015-08-14 NOTE — Telephone Encounter (Addendum)
[facility] left v/m requesting cb about additional info requested for PA for toviaz. Fax was also sent on 08/13/15 requesting additional info.Please advise.

## 2015-08-17 MED ORDER — SOLIFENACIN SUCCINATE 5 MG PO TABS: 5.0000 mg | ORAL_TABLET | Freq: Every day | ORAL | Status: DC

## 2015-08-17 NOTE — Telephone Encounter (Addendum)
Noted. plz notify patient we will try vesicare instead - plz fill out PA for vesicare - same indications as prior. Sent to pharmacy.

## 2015-08-17 NOTE — Telephone Encounter (Signed)
PA was denied. Wants reasons as to why she cannot try or hasn't tried Mybetriq or Vesicare.

## 2015-08-17 NOTE — Addendum Note (Signed)
Addended by: Ria Bush on: 08/17/2015 05:50 PM   Modules accepted: Orders, Medications

## 2015-08-18 ENCOUNTER — Encounter (HOSPITAL_COMMUNITY): Payer: Self-pay

## 2015-08-19 ENCOUNTER — Telehealth: Payer: Self-pay | Admitting: Family Medicine

## 2015-08-19 NOTE — Telephone Encounter (Signed)
Noted  

## 2015-08-19 NOTE — Telephone Encounter (Signed)
Message left for patient to return my call.  

## 2015-08-19 NOTE — Telephone Encounter (Signed)
Patient was able to get Rx with no PA required. Will try it and see how it works.

## 2015-08-19 NOTE — Telephone Encounter (Signed)
Patient returned Kim's call.  Patient said she picked up medication today. She said the pharmacy had to order it.  She said she's keeping her fingers crossed that it will work.

## 2015-08-20 ENCOUNTER — Encounter (HOSPITAL_COMMUNITY): Payer: Self-pay

## 2015-08-20 NOTE — Telephone Encounter (Signed)
What med? I don't have this - has this been taken care of?

## 2015-08-20 NOTE — Telephone Encounter (Signed)
Yes. You can disregard this. It was for the [place]. She has picked up the vesicare.

## 2015-08-25 ENCOUNTER — Encounter (HOSPITAL_COMMUNITY)
Admission: RE | Admit: 2015-08-25 | Discharge: 2015-08-25 | Disposition: A | Discharge status: Home / Self Care | Payer: Self-pay | Source: Ambulatory Visit | Attending: Internal Medicine | Admitting: Internal Medicine

## 2015-08-25 ENCOUNTER — Encounter (HOSPITAL_COMMUNITY): Payer: Self-pay

## 2015-08-27 ENCOUNTER — Encounter (HOSPITAL_COMMUNITY)
Admission: RE | Admit: 2015-08-27 | Discharge: 2015-08-27 | Disposition: A | Discharge status: Home / Self Care | Payer: Self-pay | Source: Ambulatory Visit | Attending: Internal Medicine | Admitting: Internal Medicine

## 2015-08-27 ENCOUNTER — Encounter (HOSPITAL_COMMUNITY): Payer: Self-pay

## 2015-09-01 ENCOUNTER — Encounter (HOSPITAL_COMMUNITY): Payer: Self-pay

## 2015-09-01 ENCOUNTER — Other Ambulatory Visit: Payer: Self-pay | Admitting: Family Medicine

## 2015-09-01 ENCOUNTER — Encounter (HOSPITAL_COMMUNITY)
Admission: RE | Admit: 2015-09-01 | Discharge: 2015-09-01 | Disposition: A | Discharge status: Home / Self Care | Payer: Self-pay | Source: Ambulatory Visit | Attending: Internal Medicine | Admitting: Internal Medicine

## 2015-09-03 ENCOUNTER — Encounter (HOSPITAL_COMMUNITY): Payer: Self-pay

## 2015-09-08 ENCOUNTER — Encounter (HOSPITAL_COMMUNITY): Payer: Self-pay

## 2015-09-08 ENCOUNTER — Ambulatory Visit (INDEPENDENT_AMBULATORY_CARE_PROVIDER_SITE_OTHER): Payer: Medicare Other | Admitting: *Deleted

## 2015-09-08 DIAGNOSIS — E538 Deficiency of other specified B group vitamins: Secondary | ICD-10-CM

## 2015-09-08 MED ORDER — CYANOCOBALAMIN 1000 MCG/ML IJ SOLN
1000.0000 ug | Freq: Once | INTRAMUSCULAR | Status: AC
Start: 2015-09-08 — End: 2015-09-08
  Administered 2015-09-08: 1000 ug via INTRAMUSCULAR

## 2015-09-10 ENCOUNTER — Encounter (HOSPITAL_COMMUNITY)
Admission: RE | Admit: 2015-09-10 | Discharge: 2015-09-10 | Disposition: A | Discharge status: Home / Self Care | Payer: Self-pay | Source: Ambulatory Visit | Attending: Internal Medicine | Admitting: Internal Medicine

## 2015-09-10 ENCOUNTER — Encounter (HOSPITAL_COMMUNITY): Payer: Self-pay

## 2015-09-10 DIAGNOSIS — R0600 Dyspnea, unspecified: Secondary | ICD-10-CM | POA: Insufficient documentation

## 2015-09-10 DIAGNOSIS — Z954 Presence of other heart-valve replacement: Secondary | ICD-10-CM | POA: Insufficient documentation

## 2015-09-15 ENCOUNTER — Encounter (HOSPITAL_COMMUNITY): Payer: Self-pay

## 2015-09-16 ENCOUNTER — Other Ambulatory Visit: Payer: Self-pay | Admitting: Cardiovascular Disease

## 2015-09-17 ENCOUNTER — Encounter (HOSPITAL_COMMUNITY): Payer: Self-pay

## 2015-09-17 NOTE — Telephone Encounter (Signed)
Rx(s) sent to pharmacy electronically.  

## 2015-09-18 ENCOUNTER — Ambulatory Visit (HOSPITAL_COMMUNITY): Payer: Medicare Other | Attending: Cardiovascular Disease

## 2015-09-18 ENCOUNTER — Other Ambulatory Visit: Payer: Self-pay

## 2015-09-18 DIAGNOSIS — Z6834 Body mass index (BMI) 34.0-34.9, adult: Secondary | ICD-10-CM | POA: Insufficient documentation

## 2015-09-18 DIAGNOSIS — E669 Obesity, unspecified: Secondary | ICD-10-CM | POA: Insufficient documentation

## 2015-09-18 DIAGNOSIS — I517 Cardiomegaly: Secondary | ICD-10-CM | POA: Diagnosis not present

## 2015-09-18 DIAGNOSIS — Z954 Presence of other heart-valve replacement: Secondary | ICD-10-CM | POA: Diagnosis not present

## 2015-09-18 DIAGNOSIS — Z952 Presence of prosthetic heart valve: Secondary | ICD-10-CM

## 2015-09-18 DIAGNOSIS — E785 Hyperlipidemia, unspecified: Secondary | ICD-10-CM | POA: Insufficient documentation

## 2015-09-18 DIAGNOSIS — I059 Rheumatic mitral valve disease, unspecified: Secondary | ICD-10-CM | POA: Diagnosis not present

## 2015-09-18 DIAGNOSIS — R079 Chest pain, unspecified: Secondary | ICD-10-CM | POA: Insufficient documentation

## 2015-09-22 ENCOUNTER — Encounter (HOSPITAL_COMMUNITY)
Admission: RE | Admit: 2015-09-22 | Discharge: 2015-09-22 | Disposition: A | Discharge status: Home / Self Care | Payer: Self-pay | Source: Ambulatory Visit | Attending: Internal Medicine | Admitting: Internal Medicine

## 2015-09-22 ENCOUNTER — Encounter: Payer: Self-pay | Admitting: Cardiovascular Disease

## 2015-09-23 NOTE — Progress Notes (Signed)
Discuss at [facility]

## 2015-09-24 ENCOUNTER — Encounter (HOSPITAL_COMMUNITY)
Admission: RE | Admit: 2015-09-24 | Discharge: 2015-09-24 | Disposition: A | Discharge status: Home / Self Care | Payer: Self-pay | Source: Ambulatory Visit | Attending: Internal Medicine | Admitting: Internal Medicine

## 2015-09-25 ENCOUNTER — Encounter: Payer: Self-pay | Admitting: *Deleted

## 2015-09-29 ENCOUNTER — Encounter (HOSPITAL_COMMUNITY)
Admission: RE | Admit: 2015-09-29 | Discharge: 2015-09-29 | Disposition: A | Discharge status: Home / Self Care | Payer: Self-pay | Source: Ambulatory Visit | Attending: Internal Medicine | Admitting: Internal Medicine

## 2015-10-01 ENCOUNTER — Encounter (HOSPITAL_COMMUNITY)
Admission: RE | Admit: 2015-10-01 | Discharge: 2015-10-01 | Disposition: A | Discharge status: Home / Self Care | Payer: Self-pay | Source: Ambulatory Visit | Attending: Internal Medicine | Admitting: Internal Medicine

## 2015-10-02 ENCOUNTER — Encounter: Payer: Self-pay | Admitting: Cardiovascular Disease

## 2015-10-02 ENCOUNTER — Ambulatory Visit (INDEPENDENT_AMBULATORY_CARE_PROVIDER_SITE_OTHER): Payer: Medicare Other | Admitting: Cardiovascular Disease

## 2015-10-02 VITALS — BP 118/60 | HR 73 | Resp 20 | Ht 66.0 in | Wt 211.7 lb

## 2015-10-02 DIAGNOSIS — J449 Chronic obstructive pulmonary disease, unspecified: Secondary | ICD-10-CM

## 2015-10-02 DIAGNOSIS — I5032 Chronic diastolic (congestive) heart failure: Secondary | ICD-10-CM

## 2015-10-02 DIAGNOSIS — Z954 Presence of other heart-valve replacement: Secondary | ICD-10-CM

## 2015-10-02 DIAGNOSIS — Z952 Presence of prosthetic heart valve: Secondary | ICD-10-CM

## 2015-10-02 MED ORDER — BISOPROLOL FUMARATE 5 MG PO TABS: 2.5000 mg | ORAL_TABLET | Freq: Every day | ORAL | Status: DC

## 2015-10-02 NOTE — Progress Notes (Signed)
Patient ID: [name], female   DOB: 09/05/1946, 69 y.o.   MRN: [id number]     Cardiology Office Note    Date:  10/02/2015   ID:  [name], [name] 02/16/1946, MRN [id number]  PCP:  Ria Bush, MD  Cardiologist:   Sanda Klein, MD   Chief Complaint  Patient presents with  . Follow-up    6 Months and Echo, pt denied chest pain and SOB    History of Present Illness:  [name] is a 69 y.o. female with rheumatic valvular heart disease, COPD, morbid obesity, hyperlipidemia and continues to have exertional dyspnea, almost 2 years after combined AVR/MVR (bioprostheses) and good compliance with cardiopulmonary rehab. Tragically, her husband of 35 years, [name], died in a MVA in May 26, 2023. She is still grieving intensely. They had just lost their son last December.  She describes class 2 exertional dyspnea with walking. Interestingly, she tolerates bike and rowing exercises without dyspnea. I wonder if this means that her weight is playing a big role in her dyspnea? She sees Dr. [name] in the Pulmonary clinic. She almost never wheezes and uses albuterol very rarely.  The aortic valve prosthesis is a 21 mm [employer] MagnaEase pericardial tissue valve and the mitral valve is a 25 mm device of the same make.  Echo 09/18/2015:  - Left ventricle: The cavity size was normal. There was mild focal basal hypertrophy of the septum. Systolic function was vigorous.The estimated ejection fraction was in the range of 65% to 70%. There was dynamic obstruction. Peak velocity - 4.85m/s peakvelocity with Valsalva, peak gradient 57mmHg. Wall motion was normal; there were no regional wall motion abnormalities. - Aortic valve: A bioprosthesis was present and functioningnormally. Transvalvular velocity was minimally increased. There was no stenosis. Peak velocity (S): 224 cm/s. Peak gradient (S):20 mm Hg. - Mitral valve: Severely calcified annulus. A bioprosthesis waspresent and  functioning normally. The findings are consistentwith mild to moderate stenosis. Mean gradient (D): 7 mm Hg. Peakgradient (D): 11 mm Hg. Valve area by pressure half-time: 1.76cm^2. - Left atrium: The atrium was moderately dilated.  Past Medical History  Diagnosis Date  . IBS (irritable bowel syndrome)   . Asthma   . Lung nodule     RLL nodule-41mm stable 2006, April 2009, and June 2009  . Anemia   . Hyperlipemia     takes Lovastatin daily  . Obesity   . Arthritis   . History of radiation therapy 05/30/11 to 07/07/11    rectum  . History of pneumonia     several times  . History of shingles   . Severe aortic valve stenosis  AVR 3/15 10/01/2013    mild-mod by echo 02/2014  . Rheumatic heart disease mitral stenosis     mod MS by echo 02/2014  . Anxiety   . S/P AVR (aortic valve replacement) 2015    bioprosthetic ([place])  . S/P MVR (mitral valve replacement) 2015    bioprosthetic ([place])  . DDD (degenerative disc disease)     cervical - kyphosis with mod DD changes C5/6 and C6/7; lumbar - early DD at L2/3 ([place])  . Chronic diastolic heart failure ([place]) 09/19/2014  . Small bowel obstruction, partial ([place]) 08/2013    reolved without NGT placement.   . Pancreatitis 11/2014    ?zpack related vs gallstone pancreatitis with abnormal HIDA scan pending cholecystectomy  . Osteopenia 12/2014    T -1.5 hip  . GERD (gastroesophageal reflux disease)     takes  Protonix daily  . Constipation     takes Miralax daily as needed  . COPD (chronic obstructive pulmonary disease) (HCC)     Albuterol inhaler daily as needed;Duoneb daily as needed;Spiriva daily  . PONV (postoperative nausea and vomiting)   . Essential hypertension     was on meds but after appointment Dec 11 with Dr.C he took her off  . DOE (dyspnea on exertion)   . Pneumonia 10/2013  . Chronic back pain     stenosis  . 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 History of blood  transfusion   . Cataracts, bilateral     immature  . Chronic insomnia   . Sleep apnea     PT TOLD BORDERLINE-TRIED CPAP-DID NOT HELP-SHE DOES NOT USE CPAP  . Seizures ([place])     febrile seizures as a child  . Vitamin B12 deficiency 02/28/2015    Start B12 shots 02/2015   . Pancreatitis, acute 11/16/2014    Past Surgical History  Procedure Laterality Date  . Foot surgery  left foot    hammer toe  . Hand surgery  Bil    carpal tunnel  . Back surgery  2006, 2007    2006 SPACER, 2007 decompression and fusion L4/5  . Tonsillectomy    . Colon resection  08/25/2011    Procedure: COLON RESECTION LAPAROSCOPIC;  Surgeon: Stark Klein, MD;  Location: [facility];  Service: General;  Laterality: N/A;  Laparoscopic Assisted Low Anterior Resection Diverting Ostomy and onQ pain pump  . Ileostomy  08/25/2011  . Ileostomy closure  04/02/2012    Procedure: ILEOSTOMY TAKEDOWN;  Surgeon: Stark Klein, MD;  Location: [facility];  Service: General;  Laterality: N/A;  . Bowel resection  04/02/2012    Procedure: SMALL BOWEL RESECTION;  Surgeon: Stark Klein, MD;  Location: [facility];  Service: General;  Laterality: N/A;  . Carpal tunnel release Bilateral   . Tee without cardioversion N/A 11/29/2013    Procedure: TRANSESOPHAGEAL ECHOCARDIOGRAM (TEE);  Surgeon: Sueanne Margarita, MD;  Location: [facility];  Service: Cardiovascular;  Laterality: N/A;  . Aortic valve replacement N/A 12/12/2013    Procedure: AORTIC VALVE REPLACEMENT (AVR);  Surgeon: Gaye Pollack, MD; Service: Open Heart Surgery  . Mitral valve replacement N/A 12/12/2013    Procedure: MITRAL VALVE (MV) REPLACEMENT OR REPAIR;  Surgeon: Gaye Pollack, MD; Service: Open Heart Surgery  . Intraoperative transesophageal echocardiogram N/A 12/12/2013    Procedure: INTRAOPERATIVE TRANSESOPHAGEAL ECHOCARDIOGRAM;  Surgeon: Gaye Pollack, MD;  Location: [facility] OR;  Service: Open Heart Surgery;  Laterality: N/A;  . Colonoscopy  03/2013    1 polyp, rpt 3 yrs Ardis Hughs)  . Left and  right heart catheterization with coronary angiogram N/A 11/27/2013    Procedure: LEFT AND RIGHT HEART CATHETERIZATION WITH CORONARY ANGIOGRAM;  Surgeon: Sanda Klein, MD;  Location: [facility];  Service: Cardiovascular;  Laterality: N/A;  . Cholecystectomy N/A 01/21/2015    chronic cholecystitis, Stark Klein, MD    Current Outpatient Prescriptions  Medication Sig Dispense Refill  . albuterol (PROVENTIL HFA;VENTOLIN HFA) 108 (90 BASE) MCG/ACT inhaler Inhale 2 puffs into the lungs every 6 (six) hours as needed for wheezing.    Marland Kitchen amoxicillin (AMOXIL) 500 MG capsule take 4 capsules by mouth 30 TO 60 MINUTES PRIOR TO DENTAL APPOINTMENT. 4 capsule 12  . aspirin 81 MG tablet Take 81 mg by mouth at bedtime.     Marland Kitchen  BIOTIN PO Take 1 tablet by mouth at bedtime.     . Calcium Carbonate-Vitamin D (CALCIUM 600 + D PO) Take 1,200 mg by mouth at bedtime.     . Cholecalciferol (VITAMIN D) 2000 UNITS CAPS Take 1 capsule by mouth at bedtime.     . cyanocobalamin (,VITAMIN B-12,) 1000 MCG/ML injection Inject 1 mL (1,000 mcg total) into the muscle every 30 (thirty) days.    . Fluticasone Furoate-Vilanterol (BREO ELLIPTA) 200-25 MCG/INH AEPB Inhale 1 puff into the lungs daily. 1 each 6  . furosemide (LASIX) 40 MG tablet Take 1 tablet (40 mg total) by mouth daily. 30 tablet 11  . ipratropium-albuterol (DUONEB) 0.5-2.5 (3) MG/3ML SOLN Take 3 mLs by nebulization every 6 (six) hours as needed. 360 mL 0  . lovastatin (MEVACOR) 40 MG tablet take 1 tablet by mouth at bedtime 30 tablet 6  . Miconazole Nitrate 2 % POWD 1 application by Does not apply route daily as needed (for skin). Apply to stomach and under breasts    . montelukast (SINGULAIR) 10 MG tablet take 1 tablet by mouth at bedtime 30 tablet 5  . pantoprazole (PROTONIX) 40 MG tablet Take 1 tablet (40 mg total) by mouth at bedtime. 30 tablet 11  . polyethylene glycol (MIRALAX / GLYCOLAX) packet Take 17 g by mouth daily. (Patient taking differently: Take 17 g by  mouth daily as needed. ) 14 each 0  . potassium chloride (K-DUR,KLOR-CON) 10 MEQ tablet Take 2 tablets (20 mEq total) by mouth daily. 60 tablet 11  . sertraline (ZOLOFT) 25 MG tablet Take 1 tablet (25 mg total) by mouth daily. 30 tablet 3  . solifenacin (VESICARE) 5 MG tablet Take 1 tablet (5 mg total) by mouth daily. 30 tablet 3  . SPIRIVA HANDIHALER 18 MCG inhalation capsule inhale the contents of one capsule in the handihaler once daily 30 capsule 4  . traMADol (ULTRAM) 50 MG tablet Take 50 mg by mouth every 6 (six) hours as needed for moderate pain.    . traZODone (DESYREL) 50 MG tablet take 2 tablets by mouth at bedtime if needed for sleep 60 tablet 3  . bisoprolol (ZEBETA) 5 MG tablet Take 0.5 tablets (2.5 mg total) by mouth daily. 15 tablet 6   No current facility-administered medications for this visit.    Allergies:   Codeine; Prednisone; and Sulfonamide derivatives   Social History   Social History  . Marital Status: Married    Spouse Name: N/A  . Number of Children: N/A  . Years of Education: N/A   Occupational History  . retired    Social History Main Topics  . Smoking status: Former Smoker -- 1.50 packs/day for 10 years    Types: Cigarettes  . Smokeless tobacco: Never Used     Comment: quit smoking in 01/13/1975  . Alcohol Use: 0.0 oz/week    0 Standard drinks or equivalent per week     Comment: rarely wine  . Drug Use: No  . Sexual Activity: No   Other Topics Concern  . None   Social History Narrative   Widow. Husband died 03-15-2015 MVA. 1 cat.    2 sons; 1 grandchild. One son died of MI 6   Married Jan 12, 1965   Retired-[employer], volunteers at [employer]   Activity: pulm rehab   Diet: some water, fruits/vegetables daily     Family History:  The patient's family history includes CAD (age of onset: 63) in her son; Cancer in her father; Diabetes in  her mother; Heart attack in her maternal grandfather; Heart disease in her mother; Hypertension in her mother; Stroke in her mother.  There is no history of Colon cancer, Esophageal cancer, Stomach cancer, or Rectal cancer.   ROS:   Please see the history of present illness.    Review of Systems  All other systems reviewed and are negative.  All other systems reviewed and are negative.   PHYSICAL EXAM:   VS:  BP 118/60 mmHg  Pulse 73  Resp 20  Ht 5\' 6"  (1.676 m)  Wt 211 lb 11.2 oz (96.026 kg)  BMI 34.19 kg/m2   GEN: Well nourished, well developed, in no acute distress HEENT: normal Neck: no JVD, carotid bruits, or masses Cardiac: RRR; 2/6 early peaking aortic ejection murmur; no diastolic murmurs, rubs, or gallops,no edema  Respiratory:  clear to auscultation bilaterally, normal work of breathing GI: soft, nontender, nondistended, + BS MS: no deformity or atrophy Skin: warm and dry, no rash Neuro:  Alert and Oriented x 3, Strength and sensation are intact Psych: euthymic mood, full affect  Wt Readings from Last 3 Encounters:  10/02/15 211 lb 11.2 oz (96.026 kg)  08/12/15 216 lb (97.977 kg)  07/03/15 210 lb (95.255 kg)      Studies/Labs Reviewed:   EKG:  EKG is ordered today.  The ekg ordered today demonstrates NSR, RBBB  Recent Labs: 11/16/2014: B Natriuretic Peptide 211.0* 01/22/2015: Magnesium 2.0 02/25/2015: ALT 13; Hemoglobin 13.0; Platelets 158.0 06/26/2015: BUN 16; Creatinine, Ser 1.27*; Potassium 4.2; Pro B Natriuretic peptide (BNP) 204.0*; Sodium 142   Lipid Panel    Component Value Date/Time   CHOL 214* 10/08/2014 1022   TRIG 137.0 10/08/2014 1022   HDL 46.60 10/08/2014 1022   CHOLHDL 5 10/08/2014 1022   VLDL 27.4 10/08/2014 1022   LDLCALC 140* 10/08/2014 1022   LDLDIRECT 111.0 06/26/2015 0928    ASSESSMENT:    1. Chronic diastolic heart failure ([place])   2. S/P aortic and mitral valve bioprostheses - 12/2013   3. Morbid obesity, unspecified obesity type ([place])   4. Chronic obstructive pulmonary disease, unspecified COPD type ([place])      PLAN:  In order of problems listed  above:  1. Chronic diastolic heart failure (HCC) , NYHA class 2: I wonder to what degree her symptoms may be related to her hyperdynamic LV with cavity obliteration/intracavitary gradient. Will give a trial of beta blockers and prefer the most cardioselective, bisoprolol. BP is relatively low, may limit beta blocker dose, will start very low 2. AVR/MVR. Aortic valve gradients are normal. There is a higher than expected mean gradient across the mitral valve, beta blockers may help by prolonging diastolic filling time. 3. Obesity. I am sure this is playing a substantial role in her dyspnea. Weight loss encouraged. She asked about anorexiant medications, but I think those would not be a good choice for her. 4. COPD. She is to call if she develops wheezing or worsening respiratory status on bisoprolol.   Medication Adjustments/Labs and Tests Ordered: Current medicines are reviewed at length with the patient today.  Concerns regarding medicines are outlined above.  Medication changes, Labs and Tests ordered today are listed below. Patient Instructions  Your physician recommends that you schedule a follow-up appointment in: 2 Months.  Your physician has recommended you make the following change in your medication: START Bisoprolol 2.5 mg daily  Merry Christmas and Happy New YearMikael Spray, MD  10/02/2015 5:33 PM  [facility] [address], [place], [place]  [address] Phone: [phone]; Fax: [phone]

## 2015-10-02 NOTE — Patient Instructions (Signed)
Your physician recommends that you schedule a follow-up appointment in: 2 Months.  Your physician has recommended you make the following change in your medication: START Bisoprolol 2.5 mg daily  Merry Christmas and [place]!!

## 2015-10-06 ENCOUNTER — Encounter (HOSPITAL_COMMUNITY)
Admission: RE | Admit: 2015-10-06 | Discharge: 2015-10-06 | Disposition: A | Discharge status: Home / Self Care | Payer: Self-pay | Source: Ambulatory Visit | Attending: Internal Medicine | Admitting: Internal Medicine

## 2015-10-08 ENCOUNTER — Encounter (HOSPITAL_COMMUNITY)
Admission: RE | Admit: 2015-10-08 | Discharge: 2015-10-08 | Disposition: A | Discharge status: Home / Self Care | Payer: Self-pay | Source: Ambulatory Visit | Attending: Internal Medicine | Admitting: Internal Medicine

## 2015-10-12 ENCOUNTER — Other Ambulatory Visit: Payer: Self-pay | Admitting: Family Medicine

## 2015-10-12 ENCOUNTER — Other Ambulatory Visit: Payer: Self-pay | Admitting: Internal Medicine

## 2015-10-13 ENCOUNTER — Encounter (HOSPITAL_COMMUNITY): Payer: Medicare Other

## 2015-10-13 DIAGNOSIS — Z954 Presence of other heart-valve replacement: Secondary | ICD-10-CM | POA: Insufficient documentation

## 2015-10-13 DIAGNOSIS — R0600 Dyspnea, unspecified: Secondary | ICD-10-CM | POA: Insufficient documentation

## 2015-10-15 ENCOUNTER — Encounter (HOSPITAL_COMMUNITY): Payer: Self-pay

## 2015-10-16 ENCOUNTER — Other Ambulatory Visit: Payer: Self-pay | Admitting: *Deleted

## 2015-10-16 MED ORDER — SERTRALINE HCL 25 MG PO TABS: 25.0000 mg | ORAL_TABLET | Freq: Every day | ORAL | Status: DC

## 2015-10-20 ENCOUNTER — Other Ambulatory Visit: Payer: Medicare Other

## 2015-10-20 ENCOUNTER — Encounter (HOSPITAL_COMMUNITY)
Admission: RE | Admit: 2015-10-20 | Discharge: 2015-10-20 | Disposition: A | Discharge status: Home / Self Care | Payer: Self-pay | Source: Ambulatory Visit | Attending: Internal Medicine | Admitting: Internal Medicine

## 2015-10-21 ENCOUNTER — Telehealth: Payer: Self-pay | Admitting: *Deleted

## 2015-10-21 ENCOUNTER — Other Ambulatory Visit (HOSPITAL_BASED_OUTPATIENT_CLINIC_OR_DEPARTMENT_OTHER): Payer: Medicare Other

## 2015-10-21 DIAGNOSIS — Z85048 Personal history of other malignant neoplasm of rectum, rectosigmoid junction, and anus: Secondary | ICD-10-CM | POA: Diagnosis not present

## 2015-10-21 DIAGNOSIS — C20 Malignant neoplasm of rectum: Secondary | ICD-10-CM

## 2015-10-21 NOTE — Telephone Encounter (Signed)
Notified pt that CEA results are not in yet and MD will review and release to My Chart when resulted.  Pt verbalized understanding.

## 2015-10-21 NOTE — Telephone Encounter (Signed)
Voicemail: "I just had CEA labs drawn.  I would like Dr. Benay Spice to release results to MyChart after he see's results.  Return number [phone]."

## 2015-10-22 ENCOUNTER — Other Ambulatory Visit: Payer: Medicare Other

## 2015-10-22 ENCOUNTER — Other Ambulatory Visit: Payer: Self-pay | Admitting: Family Medicine

## 2015-10-22 ENCOUNTER — Encounter (HOSPITAL_COMMUNITY): Payer: Self-pay

## 2015-10-22 ENCOUNTER — Telehealth: Payer: Self-pay | Admitting: Oncology

## 2015-10-22 ENCOUNTER — Ambulatory Visit (HOSPITAL_BASED_OUTPATIENT_CLINIC_OR_DEPARTMENT_OTHER): Payer: Medicare Other | Admitting: Oncology

## 2015-10-22 VITALS — BP 144/56 | HR 60 | Temp 98.0°F | Resp 18 | Ht 66.0 in | Wt 214.5 lb

## 2015-10-22 DIAGNOSIS — Z85048 Personal history of other malignant neoplasm of rectum, rectosigmoid junction, and anus: Secondary | ICD-10-CM | POA: Diagnosis not present

## 2015-10-22 DIAGNOSIS — I10 Essential (primary) hypertension: Secondary | ICD-10-CM

## 2015-10-22 DIAGNOSIS — E785 Hyperlipidemia, unspecified: Secondary | ICD-10-CM

## 2015-10-22 DIAGNOSIS — I5032 Chronic diastolic (congestive) heart failure: Secondary | ICD-10-CM

## 2015-10-22 DIAGNOSIS — C20 Malignant neoplasm of rectum: Secondary | ICD-10-CM

## 2015-10-22 DIAGNOSIS — E538 Deficiency of other specified B group vitamins: Secondary | ICD-10-CM

## 2015-10-22 DIAGNOSIS — N289 Disorder of kidney and ureter, unspecified: Secondary | ICD-10-CM

## 2015-10-22 LAB — CEA: CEA: 1 ng/mL (ref 0.0–4.7)

## 2015-10-22 NOTE — Telephone Encounter (Signed)
Gave patient avs report and appointments for November.  °

## 2015-10-22 NOTE — Progress Notes (Signed)
  [facility] OFFICE PROGRESS NOTE   Diagnosis: Rectal cancer  INTERVAL HISTORY:   [name] returns as scheduled. She reports a good appetite and energy level. No new pain. She reports her COPD is doing well. She has a chronic yeast rash at the groin  Objective:  Vital signs in last 24 hours:  Blood pressure 144/56, pulse 60, temperature 98 F (36.7 C), temperature source Oral, resp. rate 18, height 5\' 6"  (1.676 m), weight 214 lb 8 oz (97.297 kg), SpO2 100 %.    HEENT: Neck without mass Lymphatics: No cervical, supra-clavicular, axillary, or inguinal nodes Resp: Scattered mild wheezing, no respiratory distress Cardio: Regular rate and rhythm, 2/6 systolic murmur GI: No hepatomegaly, no mass, nontender Vascular: No leg edema  Skin: Yeast rash at the lower abdominal skin fold and groin    Lab Results:   CEA on 10/21/2015-1.0    Medications: I have reviewed the patient's current medications.  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 Ileostomy takedown 04/02/2012  3. COPD  4. admission with a partial small bowel obstruction November 2014, resolved with bowel rest  5. aortic and mitral valve replacements 12/12/2013      Disposition:  [name] remains in clinical remission from rectal cancer. She will return for an office visit and CEA in  November 2017. She will schedule a colonoscopy this year.  [name], MD  10/22/2015  9:38 AM

## 2015-10-23 ENCOUNTER — Other Ambulatory Visit (INDEPENDENT_AMBULATORY_CARE_PROVIDER_SITE_OTHER): Payer: Medicare Other

## 2015-10-23 DIAGNOSIS — E538 Deficiency of other specified B group vitamins: Secondary | ICD-10-CM | POA: Diagnosis not present

## 2015-10-23 DIAGNOSIS — I10 Essential (primary) hypertension: Secondary | ICD-10-CM

## 2015-10-23 DIAGNOSIS — E785 Hyperlipidemia, unspecified: Secondary | ICD-10-CM

## 2015-10-23 DIAGNOSIS — N289 Disorder of kidney and ureter, unspecified: Secondary | ICD-10-CM

## 2015-10-23 DIAGNOSIS — I5032 Chronic diastolic (congestive) heart failure: Secondary | ICD-10-CM | POA: Diagnosis not present

## 2015-10-23 LAB — LIPID PANEL
Cholesterol: 154 mg/dL (ref 0–200)
HDL: 49.7 mg/dL (ref >39.00)
LDL Cholesterol: 91 mg/dL (ref 0–99)
NonHDL: 103.85
Total CHOL/HDL Ratio: 3
Triglycerides: 62 mg/dL (ref 0.0–149.0)
VLDL: 12.4 mg/dL (ref 0.0–40.0)

## 2015-10-23 LAB — RENAL FUNCTION PANEL
Albumin: 3.7 g/dL (ref 3.5–5.2)
BUN: 22 mg/dL (ref 6–23)
CO2: 31 mEq/L (ref 19–32)
Calcium: 9.6 mg/dL (ref 8.4–10.5)
Chloride: 103 mEq/L (ref 96–112)
Creatinine, Ser: 1.24 mg/dL — ABNORMAL HIGH (ref 0.40–1.20)
GFR: 45.41 mL/min — ABNORMAL LOW (ref >60.00)
Glucose, Bld: 92 mg/dL (ref 70–99)
Phosphorus: 3.7 mg/dL (ref 2.3–4.6)
Potassium: 4 mEq/L (ref 3.5–5.1)
Sodium: 142 mEq/L (ref 135–145)

## 2015-10-23 LAB — CBC WITH DIFFERENTIAL/PLATELET
Basophils Absolute: 0 10*3/uL (ref 0.0–0.1)
Basophils Relative: 0.7 % (ref 0.0–3.0)
Eosinophils Absolute: 0.2 10*3/uL (ref 0.0–0.7)
Eosinophils Relative: 4.2 % (ref 0.0–5.0)
HCT: 34.9 % — ABNORMAL LOW (ref 36.0–46.0)
Hemoglobin: 11.4 g/dL — ABNORMAL LOW (ref 12.0–15.0)
Lymphocytes Relative: 10.1 % — ABNORMAL LOW (ref 12.0–46.0)
Lymphs Abs: 0.5 10*3/uL — ABNORMAL LOW (ref 0.7–4.0)
MCHC: 32.5 g/dL (ref 30.0–36.0)
MCV: 87.4 fl (ref 78.0–100.0)
Monocytes Absolute: 0.6 10*3/uL (ref 0.1–1.0)
Monocytes Relative: 11.3 % (ref 3.0–12.0)
Neutro Abs: 3.9 10*3/uL (ref 1.4–7.7)
Neutrophils Relative %: 73.7 % (ref 43.0–77.0)
Platelets: 155 10*3/uL (ref 150.0–400.0)
RBC: 4 Mil/uL (ref 3.87–5.11)
RDW: 15.7 % — ABNORMAL HIGH (ref 11.5–15.5)
WBC: 5.3 10*3/uL (ref 4.0–10.5)

## 2015-10-23 LAB — MICROALBUMIN / CREATININE URINE RATIO
Creatinine,U: 224.4 mg/dL
Microalb Creat Ratio: 1.5 mg/g (ref 0.0–30.0)
Microalb, Ur: 3.4 mg/dL — ABNORMAL HIGH (ref 0.0–1.9)

## 2015-10-23 LAB — VITAMIN B12: Vitamin B-12: 618 pg/mL (ref 211–911)

## 2015-10-26 ENCOUNTER — Other Ambulatory Visit: Payer: Medicare Other

## 2015-10-27 ENCOUNTER — Encounter (HOSPITAL_COMMUNITY)
Admission: RE | Admit: 2015-10-27 | Discharge: 2015-10-27 | Disposition: A | Discharge status: Home / Self Care | Payer: Self-pay | Source: Ambulatory Visit | Attending: Internal Medicine | Admitting: Internal Medicine

## 2015-10-28 ENCOUNTER — Encounter: Payer: Self-pay | Admitting: Family Medicine

## 2015-10-28 ENCOUNTER — Ambulatory Visit (INDEPENDENT_AMBULATORY_CARE_PROVIDER_SITE_OTHER): Payer: Medicare Other | Admitting: Family Medicine

## 2015-10-28 VITALS — BP 118/70 | HR 64 | Temp 98.1°F | Wt 213.5 lb

## 2015-10-28 DIAGNOSIS — I10 Essential (primary) hypertension: Secondary | ICD-10-CM

## 2015-10-28 DIAGNOSIS — N183 Chronic kidney disease, stage 3 unspecified: Secondary | ICD-10-CM

## 2015-10-28 DIAGNOSIS — J449 Chronic obstructive pulmonary disease, unspecified: Secondary | ICD-10-CM

## 2015-10-28 DIAGNOSIS — Z952 Presence of prosthetic heart valve: Secondary | ICD-10-CM

## 2015-10-28 DIAGNOSIS — Z0000 Encounter for general adult medical examination without abnormal findings: Secondary | ICD-10-CM

## 2015-10-28 DIAGNOSIS — E66811 Obesity, class 1: Secondary | ICD-10-CM

## 2015-10-28 DIAGNOSIS — N3946 Mixed incontinence: Secondary | ICD-10-CM

## 2015-10-28 DIAGNOSIS — E785 Hyperlipidemia, unspecified: Secondary | ICD-10-CM

## 2015-10-28 DIAGNOSIS — Z7189 Other specified counseling: Secondary | ICD-10-CM

## 2015-10-28 DIAGNOSIS — M8580 Other specified disorders of bone density and structure, unspecified site: Secondary | ICD-10-CM

## 2015-10-28 DIAGNOSIS — E669 Obesity, unspecified: Secondary | ICD-10-CM

## 2015-10-28 DIAGNOSIS — F4321 Adjustment disorder with depressed mood: Secondary | ICD-10-CM

## 2015-10-28 DIAGNOSIS — E538 Deficiency of other specified B group vitamins: Secondary | ICD-10-CM

## 2015-10-28 DIAGNOSIS — I5032 Chronic diastolic (congestive) heart failure: Secondary | ICD-10-CM

## 2015-10-28 DIAGNOSIS — C20 Malignant neoplasm of rectum: Secondary | ICD-10-CM

## 2015-10-28 DIAGNOSIS — F5104 Psychophysiologic insomnia: Secondary | ICD-10-CM

## 2015-10-28 MED ORDER — CYANOCOBALAMIN 1000 MCG/ML IJ SOLN: 1000.0000 ug | Freq: Once | INTRAMUSCULAR | Status: DC

## 2015-10-28 MED ORDER — LORCASERIN HCL 10 MG PO TABS: 10.0000 mg | ORAL_TABLET | Freq: Every day | ORAL | Status: DC

## 2015-10-28 MED ORDER — SOLIFENACIN SUCCINATE 10 MG PO TABS: 10.0000 mg | ORAL_TABLET | Freq: Every day | ORAL | Status: DC

## 2015-10-28 MED ORDER — TRAZODONE HCL 50 MG PO TABS: 50.0000 mg | ORAL_TABLET | Freq: Every day | ORAL | Status: DC

## 2015-10-28 MED ORDER — TRAMADOL HCL 50 MG PO TABS: 50.0000 mg | ORAL_TABLET | Freq: Four times a day (QID) | ORAL | Status: DC | PRN

## 2015-10-28 NOTE — Progress Notes (Signed)
Pre visit review using our clinic review tool, if applicable. No additional management support is needed unless otherwise documented below in the visit note. 

## 2015-10-28 NOTE — Progress Notes (Signed)
BP 118/70 mmHg  Pulse 64  Temp(Src) 98.1 F (36.7 C) (Oral)  Wt 213 lb 8 oz (96.843 kg)   CC: medicare wellness visit  Subjective:    Patient ID: [name], female    DOB: Nov 24, 1945, 70 y.o.   MRN: [id number]  HPI: [name] is a 70 y.o. female presenting on 10/28/2015 for Annual Exam   H/o rectal cancer in clinical remission. Sees Dr Benay Spice regularly.  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  Known chronic diastolic CHF followed by Dr Sallyanne Kuster.  Remains dyspneic with exertion. Continues attending pulm rehab twice weekly and she finds this is helpful. Recent hospitalization for chest pain with unexplained etiology ?pericarditis. Dyspnea attributed to obesity/deconditioning. Beta blocker has helped some.   Urinary urge incontinence - on vesicare 5mg  daily. Doing much better. Interested in higher dose.   Failed R>L hearing screen today. Declines audiology referral for now. Vision screen with eye doctor 07/2015 No falls in past year Denies anhedonia or depression despite previous trouble with this. Currently on zoloft 25mg  daily and trazodone 50mg  nightly PRN. Wants to come off zoloft. Discussed how to do this.   Preventative: COLONOSCOPY Date: 03/25/13 1 polyp, rpt 3 yrs [facility]).  Well woman - none recently. Had normal paps in past and would like to age out of cervical cancer screening. Discussed other GYN cancers. Mammogram 01-24-2015 WNL.  DEXA Jan 24, 2015. T score -1.5. Doesn't drink milk. Eats yogurt every morning.  Flu shot yearly pnuemovax 06/2013, prevnar 05/2014 Td Jan 23, 2006, deferred for now.  zostavax - 09/2014 Advanced directives: scanned and in chart 10/2014. Has living will at home. HCPOA is husband then son. Needs this updated.  Seat belt use discussed Sunscreen use discussed. No changing moles on skin.   Widow. Husband died 26-Mar-2015 MVA. 1 cat. 2 sons; 1 grandchild. One son died of MI 47  Married 01-23-65 Retired-[employer], volunteers at [employer] Activity: pulm rehab Diet: some water, fruits/vegetables daily  Relevant past medical, surgical, family and social history reviewed and updated as indicated. Interim medical history since our last visit reviewed. Allergies and medications reviewed and updated. Current Outpatient Prescriptions on File Prior to Visit  Medication Sig  . albuterol (PROVENTIL HFA;VENTOLIN HFA) 108 (90 BASE) MCG/ACT inhaler Inhale 2 puffs into the lungs every 6 (six) hours as needed for wheezing.  Marland Kitchen amoxicillin (AMOXIL) 500 MG capsule take 4 capsules by mouth 30 TO 60 MINUTES PRIOR TO DENTAL APPOINTMENT.  Marland Kitchen aspirin 81 MG tablet Take 81 mg by mouth at bedtime.   Marland Kitchen BIOTIN PO Take 1 tablet by mouth at bedtime.   . bisoprolol (ZEBETA) 5 MG tablet Take 0.5 tablets (2.5 mg total) by mouth daily.  . Calcium Carbonate-Vitamin D (CALCIUM 600 + D PO) Take 600 mg by mouth at bedtime.   . Cholecalciferol (VITAMIN D) 2000 UNITS CAPS Take 1 capsule by mouth at bedtime.   . Fluticasone Furoate-Vilanterol (BREO ELLIPTA) 200-25 MCG/INH AEPB Inhale 1 puff into the lungs daily.  . furosemide (LASIX) 40 MG tablet Take 1 tablet (40 mg total) by mouth daily.  Marland Kitchen ipratropium-albuterol (DUONEB) 0.5-2.5 (3) MG/3ML SOLN Take 3 mLs by nebulization every 6 (six) hours as needed.  . lovastatin (MEVACOR) 40 MG tablet take 1 tablet at bedtime  . Miconazole Nitrate 2 % POWD 1 application by Does not apply route daily as needed (for skin). Apply to stomach and under breasts  . montelukast (SINGULAIR) 10 MG  tablet take 1 tablet by mouth at bedtime  . pantoprazole (PROTONIX) 40 MG tablet Take 1 tablet (40 mg total) by mouth at bedtime.  . polyethylene glycol (MIRALAX / GLYCOLAX) packet Take 17 g by mouth daily. (Patient taking differently: Take 17 g by mouth daily as needed. )  . potassium chloride (K-DUR,KLOR-CON) 10 MEQ tablet Take 2 tablets (20 mEq total) by mouth daily.  . sertraline (ZOLOFT) 25 MG  tablet Take 1 tablet (25 mg total) by mouth daily.  Marland Kitchen SPIRIVA HANDIHALER 18 MCG inhalation capsule inhale the contents of one capsule in the handihaler once daily   No current facility-administered medications on file prior to visit.    Review of Systems  Constitutional: Negative for fever, chills, activity change, appetite change, fatigue and unexpected weight change.  HENT: Negative for hearing loss.   Eyes: Positive for visual disturbance (see HPI).  Respiratory: Negative for cough, chest tightness, shortness of breath and wheezing.   Cardiovascular: Negative for chest pain, palpitations and leg swelling.  Gastrointestinal: Negative for nausea, vomiting, abdominal pain, diarrhea, constipation, blood in stool and abdominal distention.  Genitourinary: Negative for hematuria and difficulty urinating.  Musculoskeletal: Negative for myalgias, arthralgias and neck pain.  Skin: Negative for rash.  Neurological: Negative for dizziness, seizures, syncope and headaches.  Hematological: Negative for adenopathy. Does not bruise/bleed easily.  Psychiatric/Behavioral: Negative for dysphoric mood. The patient is not nervous/anxious.    Per HPI unless specifically indicated in ROS section     Objective:    BP 118/70 mmHg  Pulse 64  Temp(Src) 98.1 F (36.7 C) (Oral)  Wt 213 lb 8 oz (96.843 kg)  Wt Readings from Last 3 Encounters:  10/28/15 213 lb 8 oz (96.843 kg)  10/22/15 214 lb 8 oz (97.297 kg)  10/02/15 211 lb 11.2 oz (96.026 kg)   Body mass index is 34.48 kg/(m^2).  Physical Exam  Constitutional: She is oriented to person, place, and time. She appears well-developed and well-nourished. No distress.  HENT:  Head: Normocephalic and atraumatic.  Right Ear: Hearing, tympanic membrane, external ear and ear canal normal.  Left Ear: Hearing, tympanic membrane, external ear and ear canal normal.  Nose: Nose normal.  Mouth/Throat: Uvula is midline, oropharynx is clear and moist and mucous  membranes are normal. No oropharyngeal exudate, posterior oropharyngeal edema or posterior oropharyngeal erythema.  Eyes: Conjunctivae and EOM are normal. Pupils are equal, round, and reactive to light. No scleral icterus.  Neck: Normal range of motion. Neck supple. Carotid bruit is not present. No thyromegaly present.  Cardiovascular: Normal rate, regular rhythm and intact distal pulses.   Murmur (bioprosthesis murmur) heard. Pulses:      Radial pulses are 2+ on the right side, and 2+ on the left side.  Pulmonary/Chest: Effort normal and breath sounds normal. No respiratory distress. She has no wheezes. She has no rales.  Breast exam - declined  Abdominal: Soft. Bowel sounds are normal. She exhibits no distension and no mass. There is no tenderness. There is no rebound and no guarding.  Genitourinary:  GYN exam - declined  Musculoskeletal: Normal range of motion. She exhibits no edema.  Lymphadenopathy:    She has no cervical adenopathy.  Neurological: She is alert and oriented to person, place, and time.  CN grossly intact, station and gait intact Recall 3/3  Calculation 4/5 serial 7s  Skin: Skin is warm and dry. No rash noted.  Psychiatric: She has a normal mood and affect. Her behavior is normal. Judgment and thought  content normal.  Nursing note and vitals reviewed.  Results for orders placed or performed in visit on 10/23/15  Lipid panel  Result Value Ref Range   Cholesterol 154 0 - 200 mg/dL   Triglycerides 62.0 0.0 - 149.0 mg/dL   HDL 49.70 >39.00 mg/dL   VLDL 12.4 0.0 - 40.0 mg/dL   LDL Cholesterol 91 0 - 99 mg/dL   Total CHOL/HDL Ratio 3    NonHDL 103.85   CBC with Differential/Platelet  Result Value Ref Range   WBC 5.3 4.0 - 10.5 K/uL   RBC 4.00 3.87 - 5.11 Mil/uL   Hemoglobin 11.4 (L) 12.0 - 15.0 g/dL   HCT 34.9 (L) 36.0 - 46.0 %   MCV 87.4 78.0 - 100.0 fl   MCHC 32.5 30.0 - 36.0 g/dL   RDW 15.7 (H) 11.5 - 15.5 %   Platelets 155.0 150.0 - 400.0 K/uL    Neutrophils Relative % 73.7 43.0 - 77.0 %   Lymphocytes Relative 10.1 (L) 12.0 - 46.0 %   Monocytes Relative 11.3 3.0 - 12.0 %   Eosinophils Relative 4.2 0.0 - 5.0 %   Basophils Relative 0.7 0.0 - 3.0 %   Neutro Abs 3.9 1.4 - 7.7 K/uL   Lymphs Abs 0.5 (L) 0.7 - 4.0 K/uL   Monocytes Absolute 0.6 0.1 - 1.0 K/uL   Eosinophils Absolute 0.2 0.0 - 0.7 K/uL   Basophils Absolute 0.0 0.0 - 0.1 K/uL  Renal function panel  Result Value Ref Range   Sodium 142 135 - 145 mEq/L   Potassium 4.0 3.5 - 5.1 mEq/L   Chloride 103 96 - 112 mEq/L   CO2 31 19 - 32 mEq/L   Calcium 9.6 8.4 - 10.5 mg/dL   Albumin 3.7 3.5 - 5.2 g/dL   BUN 22 6 - 23 mg/dL   Creatinine, Ser 1.24 (H) 0.40 - 1.20 mg/dL   Glucose, Bld 92 70 - 99 mg/dL   Phosphorus 3.7 2.3 - 4.6 mg/dL   GFR 45.41 (L) >60.00 mL/min  Vitamin B12  Result Value Ref Range   Vitamin B-12 618 211 - 911 pg/mL  Microalbumin / creatinine urine ratio  Result Value Ref Range   Microalb, Ur 3.4 (H) 0.0 - 1.9 mg/dL   Creatinine,U 224.4 mg/dL   Microalb Creat Ratio 1.5 0.0 - 30.0 mg/g   *Note: Due to a large number of results and/or encounters for the requested time period, some results have not been displayed. A complete set of results can be found in Results Review.      Assessment & Plan:   Problem List Items Addressed This Visit    Vitamin B12 deficiency    b12 now normal. 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      Relevant Medications   traMADol (ULTRAM) 50 MG tablet   Osteopenia    Discussed calcium/vit D use.      Obesity, Class I, BMI 30-34.9    Discussed healthy diet and lifestyle changes to affect sustainable weight loss. rec against phentermine.  Discussed belviq vs topamax. Pt interested  in belviq. Provided with Rx and initiation box with coupon but rec she check with Dr Sallyanne Kuster first (in valvular disease history). Pt agrees with plan.  Relevant Medications   Lorcaserin HCl (BELVIQ) 10 MG TABS   Mixed stress and urge urinary incontinence    Improved on vesicare. Increase dose to 10mg  daily.      Relevant Medications   solifenacin (VESICARE) 10 MG tablet   Medicare annual wellness visit, subsequent - Primary    I have personally reviewed the Medicare Annual Wellness questionnaire and have noted 1. The patient's medical and social history 2. Their use of alcohol, tobacco or illicit drugs 3. Their current medications and supplements 4. The patient's functional ability including ADL's, fall risks, home safety risks and hearing or visual impairment. Cognitive function has been assessed and addressed as indicated.  5. Diet and physical activity 6. Evidence for depression or mood disorders The patients weight, height, BMI have been recorded in the chart. I have made referrals, counseling and provided education to the patient based on review of the above and I have provided the pt with a written personalized care plan for preventive services. Provider list updated.. See scanned questionairre as needed for further documentation. Reviewed preventative protocols and updated unless pt declined.       HLD (hyperlipidemia)    Chronic, stable. Continue statin.      Health maintenance examination    Preventative protocols reviewed and updated unless pt declined. Discussed healthy diet and lifestyle.       Essential hypertension, benign    Chronic, stable. Continue current regimen.      COPD (chronic obstructive pulmonary disease) (HCC)    Appreciate pulm care.       CKD (chronic kidney disease) stage 3, GFR 30-59 ml/min    Discussed with patient. rec good hydration status and avoiding NSAIDs and other nephrotoxic agents.      Chronic insomnia    Has decreased  trazodone to 50mg  nightly. Overall improving.      Chronic diastolic heart failure (HCC)    Chronic, stable, euvolemic today.      Advanced care planning/counseling discussion    Advanced directives: scanned and in chart 10/2014. Has living will at home. HCPOA is husband then son. Needs this updated.       Adjustment disorder with depressed mood    Overall improving with B12 supplementation. Desires to come off sertraline. Discussed taper schedule.       Other Visit Diagnoses    Vitamin B 12 deficiency        Relevant Medications    cyanocobalamin ((VITAMIN B-12)) injection 1,000 mcg        Follow up plan: Return in about 6 months (around 04/26/2016), or if symptoms worsen or fail to improve, for follow up visit.

## 2015-10-28 NOTE — Assessment & Plan Note (Signed)
Chronic, stable, euvolemic today.

## 2015-10-28 NOTE — Assessment & Plan Note (Signed)
Discussed healthy diet and lifestyle changes to affect sustainable weight loss. rec against phentermine.  Discussed belviq vs topamax. Pt interested in belviq. Provided with Rx and initiation box with coupon but rec she check with Dr Sallyanne Kuster first (in valvular disease history). Pt agrees with plan.

## 2015-10-28 NOTE — Patient Instructions (Addendum)
Update advanced directive.  Ok to change from b12 shots to oral b12 - take 550mg once daily.  Decrease calcium to 1 tablet daily. Iron handout today. Check with Dr CSallyanne Kusterabout belviq. If not recommended with your valvular heart history we can try topamax. Belviq prescription printed out today.   Health Maintenance, Female Adopting a healthy lifestyle and getting preventive care can go a long way to promote health and wellness. Talk with your health care provider about what schedule of regular examinations is right for you. This is a good chance for you to check in with your provider about disease prevention and staying healthy. In between checkups, there are plenty of things you can do on your own. Experts have done a lot of research about which lifestyle changes and preventive measures are most likely to keep you healthy. Ask your health care provider for more information. WEIGHT AND DIET  Eat a healthy diet  Be sure to include plenty of vegetables, fruits, low-fat dairy products, and lean protein.  Do not eat a lot of foods high in solid fats, added sugars, or salt.  Get regular exercise. This is one of the most important things you can do for your health.  Most adults should exercise for at least 150 minutes each week. The exercise should increase your heart rate and make you sweat (moderate-intensity exercise).  Most adults should also do strengthening exercises at least twice a week. This is in addition to the moderate-intensity exercise.  Maintain a healthy weight  Body mass index (BMI) is a measurement that can be used to identify possible weight problems. It estimates body fat based on height and weight. Your health care provider can help determine your BMI and help you achieve or maintain a healthy weight.  For females 2106years of age and older:   A BMI below 18.5 is considered underweight.  A BMI of 18.5 to 24.9 is normal.  A BMI of 25 to 29.9 is considered  overweight.  A BMI of 30 and above is considered obese.  Watch levels of cholesterol and blood lipids  You should start having your blood tested for lipids and cholesterol at 71years of age, then have this test every 5 years.  You may need to have your cholesterol levels checked more often if:  Your lipid or cholesterol levels are high.  You are older than 5106years of age.  You are at high risk for heart disease.  CANCER SCREENING   Lung Cancer  Lung cancer screening is recommended for adults 589860years old who are at high risk for lung cancer because of a history of smoking.  A yearly low-dose CT scan of the lungs is recommended for people who:  Currently smoke.  Have quit within the past 15 years.  Have at least a 30-pack-year history of smoking. A pack year is smoking an average of one pack of cigarettes a day for 1 year.  Yearly screening should continue until it has been 15 years since you quit.  Yearly screening should stop if you develop a health problem that would prevent you from having lung cancer treatment.  Breast Cancer  Practice breast self-awareness. This means understanding how your breasts normally appear and feel.  It also means doing regular breast self-exams. Let your health care provider know about any changes, no matter how small.  If you are in your 20s or 30s, you should have a clinical breast exam (CBE) by a health care provider  every 1-3 years as part of a regular health exam.  If you are 69 or older, have a CBE every year. Also consider having a breast X-ray (mammogram) every year.  If you have a family history of breast cancer, talk to your health care provider about genetic screening.  If you are at high risk for breast cancer, talk to your health care provider about having an MRI and a mammogram every year.  Breast cancer gene (BRCA) assessment is recommended for women who have family members with BRCA-related cancers. BRCA-related  cancers include:  Breast.  Ovarian.  Tubal.  Peritoneal cancers.  Results of the assessment will determine the need for genetic counseling and BRCA1 and BRCA2 testing. Cervical Cancer Your health care provider may recommend that you be screened regularly for cancer of the pelvic organs (ovaries, uterus, and vagina). This screening involves a pelvic examination, including checking for microscopic changes to the surface of your cervix (Pap test). You may be encouraged to have this screening done every 3 years, beginning at age 82.  For women ages 18-65, health care providers may recommend pelvic exams and Pap testing every 3 years, or they may recommend the Pap and pelvic exam, combined with testing for human papilloma virus (HPV), every 5 years. Some types of HPV increase your risk of cervical cancer. Testing for HPV may also be done on women of any age with unclear Pap test results.  Other health care providers may not recommend any screening for nonpregnant women who are considered low risk for pelvic cancer and who do not have symptoms. Ask your health care provider if a screening pelvic exam is right for you.  If you have had past treatment for cervical cancer or a condition that could lead to cancer, you need Pap tests and screening for cancer for at least 20 years after your treatment. If Pap tests have been discontinued, your risk factors (such as having a new sexual partner) need to be reassessed to determine if screening should resume. Some women have medical problems that increase the chance of getting cervical cancer. In these cases, your health care provider may recommend more frequent screening and Pap tests. Colorectal Cancer  This type of cancer can be detected and often prevented.  Routine colorectal cancer screening usually begins at 71 years of age and continues through 71 years of age.  Your health care provider may recommend screening at an earlier age if you have risk  factors for colon cancer.  Your health care provider may also recommend using home test kits to check for hidden blood in the stool.  A small camera at the end of a tube can be used to examine your colon directly (sigmoidoscopy or colonoscopy). This is done to check for the earliest forms of colorectal cancer.  Routine screening usually begins at age 9.  Direct examination of the colon should be repeated every 5-10 years through 71 years of age. However, you may need to be screened more often if early forms of precancerous polyps or small growths are found. Skin Cancer  Check your skin from head to toe regularly.  Tell your health care provider about any new moles or changes in moles, especially if there is a change in a mole's shape or color.  Also tell your health care provider if you have a mole that is larger than the size of a pencil eraser.  Always use sunscreen. Apply sunscreen liberally and repeatedly throughout the day.  Protect yourself by  wearing long sleeves, pants, a wide-brimmed hat, and sunglasses whenever you are outside. HEART DISEASE, DIABETES, AND HIGH BLOOD PRESSURE   High blood pressure causes heart disease and increases the risk of stroke. High blood pressure is more likely to develop in:  People who have blood pressure in the high end of the normal range (130-139/85-89 mm Hg).  People who are overweight or obese.  People who are African American.  If you are 35-33 years of age, have your blood pressure checked every 3-5 years. If you are 13 years of age or older, have your blood pressure checked every year. You should have your blood pressure measured twice--once when you are at a hospital or clinic, and once when you are not at a hospital or clinic. Record the average of the two measurements. To check your blood pressure when you are not at a hospital or clinic, you can use:  An automated blood pressure machine at a pharmacy.  A home blood pressure  monitor.  If you are between 47 years and 16 years old, ask your health care provider if you should take aspirin to prevent strokes.  Have regular diabetes screenings. This involves taking a blood sample to check your fasting blood sugar level.  If you are at a normal weight and have a low risk for diabetes, have this test once every three years after 71 years of age.  If you are overweight and have a high risk for diabetes, consider being tested at a younger age or more often. PREVENTING INFECTION  Hepatitis B  If you have a higher risk for hepatitis B, you should be screened for this virus. You are considered at high risk for hepatitis B if:  You were born in a country where hepatitis B is common. Ask your health care provider which countries are considered high risk.  Your parents were born in a high-risk country, and you have not been immunized against hepatitis B (hepatitis B vaccine).  You have HIV or AIDS.  You use needles to inject street drugs.  You live with someone who has hepatitis B.  You have had sex with someone who has hepatitis B.  You get hemodialysis treatment.  You take certain medicines for conditions, including cancer, organ transplantation, and autoimmune conditions. Hepatitis C  Blood testing is recommended for:  Everyone born from [birth year] through 1966.  Anyone with known risk factors for hepatitis C. Sexually transmitted infections (STIs)  You should be screened for sexually transmitted infections (STIs) including gonorrhea and chlamydia if:  You are sexually active and are younger than 71 years of age.  You are older than 71 years of age and your health care provider tells you that you are at risk for this type of infection.  Your sexual activity has changed since you were last screened and you are at an increased risk for chlamydia or gonorrhea. Ask your health care provider if you are at risk.  If you do not have HIV, but are at risk, it may be  recommended that you take a prescription medicine daily to prevent HIV infection. This is called pre-exposure prophylaxis (PrEP). You are considered at risk if:  You are sexually active and do not regularly use condoms or know the HIV status of your partner(s).  You take drugs by injection.  You are sexually active with a partner who has HIV. Talk with your health care provider about whether you are at high risk of being infected with HIV. If  you choose to begin PrEP, you should first be tested for HIV. You should then be tested every 3 months for as long as you are taking PrEP.  PREGNANCY   If you are premenopausal and you may become pregnant, ask your health care provider about preconception counseling.  If you may become pregnant, take 400 to 800 micrograms (mcg) of folic acid every day.  If you want to prevent pregnancy, talk to your health care provider about birth control (contraception). OSTEOPOROSIS AND MENOPAUSE   Osteoporosis is a disease in which the bones lose minerals and strength with aging. This can result in serious bone fractures. Your risk for osteoporosis can be identified using a bone density scan.  If you are 81 years of age or older, or if you are at risk for osteoporosis and fractures, ask your health care provider if you should be screened.  Ask your health care provider whether you should take a calcium or vitamin D supplement to lower your risk for osteoporosis.  Menopause may have certain physical symptoms and risks.  Hormone replacement therapy may reduce some of these symptoms and risks. Talk to your health care provider about whether hormone replacement therapy is right for you.  HOME CARE INSTRUCTIONS   Schedule regular health, dental, and eye exams.  Stay current with your immunizations.   Do not use any tobacco products including cigarettes, chewing tobacco, or electronic cigarettes.  If you are pregnant, do not drink alcohol.  If you are  breastfeeding, limit how much and how often you drink alcohol.  Limit alcohol intake to no more than 1 drink per day for nonpregnant women. One drink equals 12 ounces of beer, 5 ounces of wine, or 1 ounces of hard liquor.  Do not use street drugs.  Do not share needles.  Ask your health care provider for help if you need support or information about quitting drugs.  Tell your health care provider if you often feel depressed.  Tell your health care provider if you have ever been abused or do not feel safe at home.   This information is not intended to replace advice given to you by your health care provider. Make sure you discuss any questions you have with your health care provider.   Document Released: 04/11/2011 Document Revised: 10/17/2014 Document Reviewed: 08/28/2013 Elsevier Interactive Patient Education [employer].

## 2015-10-28 NOTE — Assessment & Plan Note (Signed)
Chronic, stable. Continue current regimen. 

## 2015-10-28 NOTE — Assessment & Plan Note (Signed)
Appreciate onc care.  

## 2015-10-28 NOTE — Assessment & Plan Note (Signed)
Stable exam.

## 2015-10-28 NOTE — Assessment & Plan Note (Signed)
Discussed with patient. rec good hydration status and avoiding NSAIDs and other nephrotoxic agents.

## 2015-10-28 NOTE — Assessment & Plan Note (Signed)
Advanced directives: scanned and in chart 10/2014. Has living will at home. HCPOA is husband then son. Needs this updated.

## 2015-10-28 NOTE — Assessment & Plan Note (Signed)
b12 now normal. Stop shots, start oral 536mcg daily supplementation

## 2015-10-28 NOTE — Assessment & Plan Note (Signed)
Overall improving with B12 supplementation. Desires to come off sertraline. Discussed taper schedule.

## 2015-10-28 NOTE — Assessment & Plan Note (Signed)
Appreciate pulm care.  ?

## 2015-10-28 NOTE — Assessment & Plan Note (Signed)
Improved on vesicare. Increase dose to 10mg  daily.

## 2015-10-28 NOTE — Assessment & Plan Note (Signed)
Preventative protocols reviewed and updated unless pt declined. Discussed healthy diet and lifestyle.  

## 2015-10-28 NOTE — Assessment & Plan Note (Signed)
Discussed calcium/vit D use.

## 2015-10-28 NOTE — Assessment & Plan Note (Signed)

## 2015-10-28 NOTE — Assessment & Plan Note (Signed)
Chronic, stable. Continue statin.  

## 2015-10-28 NOTE — Assessment & Plan Note (Signed)
Has decreased trazodone to 50mg  nightly. Overall improving.

## 2015-10-29 ENCOUNTER — Encounter (HOSPITAL_COMMUNITY)
Admission: RE | Admit: 2015-10-29 | Discharge: 2015-10-29 | Disposition: A | Discharge status: Home / Self Care | Payer: Self-pay | Source: Ambulatory Visit | Attending: Internal Medicine | Admitting: Internal Medicine

## 2015-11-03 ENCOUNTER — Encounter (HOSPITAL_COMMUNITY): Payer: Self-pay

## 2015-11-05 ENCOUNTER — Encounter (HOSPITAL_COMMUNITY)
Admission: RE | Admit: 2015-11-05 | Discharge: 2015-11-05 | Disposition: A | Discharge status: Home / Self Care | Payer: Self-pay | Source: Ambulatory Visit | Attending: Internal Medicine | Admitting: Internal Medicine

## 2015-11-10 ENCOUNTER — Encounter (HOSPITAL_COMMUNITY)
Admission: RE | Admit: 2015-11-10 | Discharge: 2015-11-10 | Disposition: A | Discharge status: Home / Self Care | Payer: Self-pay | Source: Ambulatory Visit | Attending: Internal Medicine | Admitting: Internal Medicine

## 2015-11-12 ENCOUNTER — Encounter (HOSPITAL_COMMUNITY)
Admission: RE | Admit: 2015-11-12 | Discharge: 2015-11-12 | Disposition: A | Discharge status: Home / Self Care | Payer: Self-pay | Source: Ambulatory Visit | Attending: Internal Medicine | Admitting: Internal Medicine

## 2015-11-12 DIAGNOSIS — Z954 Presence of other heart-valve replacement: Secondary | ICD-10-CM | POA: Insufficient documentation

## 2015-11-12 DIAGNOSIS — R0600 Dyspnea, unspecified: Secondary | ICD-10-CM | POA: Insufficient documentation

## 2015-11-16 ENCOUNTER — Encounter: Payer: Self-pay | Admitting: Internal Medicine

## 2015-11-16 ENCOUNTER — Ambulatory Visit (INDEPENDENT_AMBULATORY_CARE_PROVIDER_SITE_OTHER): Payer: Medicare Other | Admitting: Internal Medicine

## 2015-11-16 VITALS — BP 126/78 | HR 84 | Ht 66.0 in | Wt 212.2 lb

## 2015-11-16 DIAGNOSIS — J449 Chronic obstructive pulmonary disease, unspecified: Secondary | ICD-10-CM | POA: Diagnosis not present

## 2015-11-16 NOTE — Progress Notes (Signed)
Subjective:     Patient ID: [name], female   DOB: 1946-01-01, 70 y.o.   MRN: [id number]  HPI  OV 11/16/2015  Chief Complaint  Patient presents with  . Follow-up    Pt states she has noticed an improvement in SOB - pt states her cardiologist put her on a beta blocker and she feels this is the reason she is not as SOB. Pt c/o chills x 1 day. Pt states her cough is baseline - mild dry cough. Pt denies CP/tightness.     COPD follow-up with associated cardiac history and complicated grief following death of family  She is improved. Grief is improved. She still has some yearnin. Shortness of breath is improved after Dr. Loletha Grayer started her on bisoprolol. Overall COPD stable. However since yesterday she is having feelings of hemolyzed and feeling cold although objectively she has no fever or chills. She did have some nausea. Denies any respiratory change from baseline. Denies any urinary tract infection symptoms. Denies any diarrhea.     Current outpatient prescriptions:  .  albuterol (PROVENTIL HFA;VENTOLIN HFA) 108 (90 BASE) MCG/ACT inhaler, Inhale 2 puffs into the lungs every 6 (six) hours as needed for wheezing., Disp: , Rfl:  .  amoxicillin (AMOXIL) 500 MG capsule, take 4 capsules by mouth 30 TO 60 MINUTES PRIOR TO DENTAL APPOINTMENT., Disp: 4 capsule, Rfl: 12 .  aspirin 81 MG tablet, Take 81 mg by mouth at bedtime. , Disp: , Rfl:  .  BIOTIN PO, Take 1 tablet by mouth at bedtime. , Disp: , Rfl:  .  bisoprolol (ZEBETA) 5 MG tablet, Take 0.5 tablets (2.5 mg total) by mouth daily., Disp: 15 tablet, Rfl: 6 .  Calcium Carbonate-Vitamin D (CALCIUM 600 + D PO), Take 600 mg by mouth at bedtime. , Disp: , Rfl:  .  Cholecalciferol (VITAMIN D) 2000 UNITS CAPS, Take 1 capsule by mouth at bedtime. , Disp: , Rfl:  .  Fluticasone Furoate-Vilanterol (BREO ELLIPTA) 200-25 MCG/INH AEPB, Inhale 1 puff into the lungs daily., Disp: 1 each, Rfl: 6 .  furosemide (LASIX) 40 MG tablet, Take 1 tablet (40 mg total)  by mouth daily., Disp: 30 tablet, Rfl: 11 .  ipratropium-albuterol (DUONEB) 0.5-2.5 (3) MG/3ML SOLN, Take 3 mLs by nebulization every 6 (six) hours as needed., Disp: 360 mL, Rfl: 0 .  lovastatin (MEVACOR) 40 MG tablet, take 1 tablet at bedtime, Disp: 30 tablet, Rfl: 6 .  Miconazole Nitrate 2 % POWD, 1 application by Does not apply route daily as needed (for skin). Apply to stomach and under breasts, Disp: , Rfl:  .  montelukast (SINGULAIR) 10 MG tablet, take 1 tablet by mouth at bedtime, Disp: 30 tablet, Rfl: 5 .  Multiple Vitamins-Minerals (PRESERVISION AREDS 2 PO), Take 1 capsule by mouth daily., Disp: , Rfl:  .  pantoprazole (PROTONIX) 40 MG tablet, Take 1 tablet (40 mg total) by mouth at bedtime., Disp: 30 tablet, Rfl: 11 .  polyethylene glycol (MIRALAX / GLYCOLAX) packet, Take 17 g by mouth daily. (Patient taking differently: Take 17 g by mouth daily as needed. ), Disp: 14 each, Rfl: 0 .  potassium chloride (K-DUR,KLOR-CON) 10 MEQ tablet, Take 2 tablets (20 mEq total) by mouth daily., Disp: 60 tablet, Rfl: 11 .  sertraline (ZOLOFT) 25 MG tablet, Take 1 tablet (25 mg total) by mouth daily., Disp: 30 tablet, Rfl: 6 .  solifenacin (VESICARE) 10 MG tablet, Take 1 tablet (10 mg total) by mouth daily., Disp: 30 tablet, Rfl: 11 .  SPIRIVA HANDIHALER 18 MCG inhalation capsule, inhale the contents of one capsule in the handihaler once daily, Disp: 30 capsule, Rfl: 4 .  traMADol (ULTRAM) 50 MG tablet, Take 1 tablet (50 mg total) by mouth every 6 (six) hours as needed for moderate pain., Disp: 30 tablet, Rfl: 0 .  traZODone (DESYREL) 50 MG tablet, Take 1 tablet (50 mg total) by mouth at bedtime., Disp: 30 tablet, Rfl: 11 .  vitamin B-12 (CYANOCOBALAMIN) 500 MCG tablet, Take 500 mcg by mouth daily., Disp: , Rfl:  .  Lorcaserin HCl (BELVIQ) 10 MG TABS, Take 10 mg by mouth daily. (Patient not taking: Reported on 11/16/2015), Disp: 30 tablet, Rfl: 3  Allergies  Allergen Reactions  . Codeine Nausea Only  .  Prednisone Hives    In different places all over the body.  . Sulfonamide Derivatives Nausea Only    Immunization History  Administered Date(s) Administered  . H1N1 09/17/2008  . Influenza Split 07/18/2012  . Influenza Whole 08/10/2005, 06/24/2009, 07/11/2011  . Influenza-Unspecified 07/24/2013, 06/30/2014, 06/30/2015  . Pneumococcal Conjugate-13 06/02/2014  . Pneumococcal Polysaccharide-23 08/10/2005, 04/21/2008, 06/25/2013  . Td 10/11/2005  . Zoster 09/16/2014      Review of Systems Per hpi    Objective:   Physical Exam  Constitutional: She is oriented to person, place, and time. She appears well-developed and well-nourished. No distress.  Obese Looks a bit deconditioned  HENT:  Head: Normocephalic and atraumatic.  Right Ear: External ear normal.  Left Ear: External ear normal.  Mouth/Throat: Oropharynx is clear and moist. No oropharyngeal exudate.  Eyes: Conjunctivae and EOM are normal. Pupils are equal, round, and reactive to light. Right eye exhibits no discharge. Left eye exhibits no discharge. No scleral icterus.  Neck: Normal range of motion. Neck supple. No JVD present. No tracheal deviation present. No thyromegaly present.  Cardiovascular: Normal rate, regular rhythm, normal heart sounds and intact distal pulses.  Exam reveals no gallop and no friction rub.   No murmur heard. Pulmonary/Chest: Effort normal and breath sounds normal. No respiratory distress. She has no wheezes. She has no rales. She exhibits no tenderness.  Abdominal: Soft. Bowel sounds are normal. She exhibits no distension and no mass. There is no tenderness. There is no rebound and no guarding.  Musculoskeletal: Normal range of motion. She exhibits no edema or tenderness.  Lymphadenopathy:    She has no cervical adenopathy.  Neurological: She is alert and oriented to person, place, and time. She has normal reflexes. No cranial nerve deficit. She exhibits normal muscle tone. Coordination normal.   Skin: Skin is warm and dry. No rash noted. She is not diaphoretic. No erythema. No pallor.  Psychiatric: She has a normal mood and affect. Her behavior is normal. Judgment and thought content normal.  Vitals reviewed.  Filed Vitals:   11/16/15 1345  BP: 126/78  Pulse: 84  Height: 5\' 6"  (1.676 m)  Weight: 212 lb 3.2 oz (96.253 kg)  SpO2: 94%        Assessment:       ICD-9-CM ICD-10-CM   1. Chronic obstructive pulmonary disease, unspecified COPD, unspecified chronic bronchitis type 496 J44.9        Plan:      Stable disease Glad shortness of breath better after Dr C started Zebeta Unclear why you are feeling cold but watch out for infectioin  Plan Continue your meds as before Call sooner if symptoms worse  Followup 3 months with Dr [name] Will discuss ct scan lung cancer screening  at [facility]    Dr. Brand Males, [name], [facility].C.P Pulmonary and Critical Care Medicine Staff Physician [facility] Pager: [phone], If no answer or between  15:00h - 7:00h: call [phone]  11/16/2015 2:00 PM

## 2015-11-16 NOTE — Patient Instructions (Signed)
ICD-9-CM ICD-10-CM   1. Chronic obstructive pulmonary disease, unspecified COPD, unspecified chronic bronchitis type 496 J44.9     Stable disease Glad shortness of breath better after Dr C started Zebeta Unclear why you are feeling cold but watch out for infectioin  Plan Continue your meds as before Call sooner if symptoms worse  Followup 3 months with Dr [name] Will discuss ct scan lung cancer screening at [facility]

## 2015-11-17 ENCOUNTER — Encounter (HOSPITAL_COMMUNITY): Payer: Self-pay

## 2015-11-18 ENCOUNTER — Telehealth: Payer: Self-pay | Admitting: Internal Medicine

## 2015-11-18 MED ORDER — DOXYCYCLINE HYCLATE 100 MG PO TABS: 100.0000 mg | ORAL_TABLET | Freq: Two times a day (BID) | ORAL | Status: DC

## 2015-11-18 MED ORDER — METHYLPREDNISOLONE 8 MG PO TABS: ORAL_TABLET | ORAL | Status: DC

## 2015-11-18 NOTE — Telephone Encounter (Signed)
Last ov with MR 11/16/15  Patient Instructions       ICD-9-CM ICD-10-CM   1. Chronic obstructive pulmonary disease, unspecified COPD, unspecified chronic bronchitis type 496 J44.9     Stable disease Glad shortness of breath better after Dr C started Zebeta Unclear why you are feeling cold but watch out for infectioin  Plan Continue your meds as before Call sooner if symptoms worse  Followup 3 months with Dr [name] Will discuss ct scan lung cancer screening at [facility]   Called and spoke with pt. Pt states that she was seen by MR on 11/16/15 and was told by MR that if she did not feel better to call our office. She c/o sinus drainage, chest congestion with a non productive cough, nausea, and increased wheezing since Monday. She states that she would like MR's recs. I verified pharmacy as [facility] on [address]. I explained to her that I would send a message to MR and once we receive his recs, we will return her call.   MR please advise

## 2015-11-18 NOTE — Telephone Encounter (Signed)
Called spoke with pt. She is unable to take prednisone bc it gives her hives. Doxy has been sent in.  Per MR called in methylpred 8 mg. #30 Take 4 tabs daily x 3 days, then 3 tabs daily x 3 days, then 2 tabs daily x 3 days, then 1 tab daily x 3 days, then stop\  Called spoke with pt again and made aware. Nothing further needed

## 2015-11-18 NOTE — Telephone Encounter (Signed)
Likely sinus infection with aecopd  - Take doxycycline 100mg  po twice daily x 7 days; take after meals and avoid sunlight  - Take prednisone 40 mg daily x 2 days, then 20mg  daily x 2 days, then 10mg  daily x 2 days, then 5mg  daily x 2 days and stop   Allergies  Allergen Reactions  . Codeine Nausea Only  . Prednisone Hives    In different places all over the body.  . Sulfonamide Derivatives Nausea Only

## 2015-11-19 ENCOUNTER — Encounter (HOSPITAL_COMMUNITY): Payer: Self-pay

## 2015-11-24 ENCOUNTER — Encounter (HOSPITAL_COMMUNITY)
Admission: RE | Admit: 2015-11-24 | Discharge: 2015-11-24 | Disposition: A | Discharge status: Home / Self Care | Payer: Self-pay | Source: Ambulatory Visit | Attending: Internal Medicine | Admitting: Internal Medicine

## 2015-11-26 ENCOUNTER — Encounter (HOSPITAL_COMMUNITY)
Admission: RE | Admit: 2015-11-26 | Discharge: 2015-11-26 | Disposition: A | Discharge status: Home / Self Care | Payer: Self-pay | Source: Ambulatory Visit | Attending: Internal Medicine | Admitting: Internal Medicine

## 2015-12-01 ENCOUNTER — Encounter (HOSPITAL_COMMUNITY)
Admission: RE | Admit: 2015-12-01 | Discharge: 2015-12-01 | Disposition: A | Discharge status: Home / Self Care | Payer: Self-pay | Source: Ambulatory Visit | Attending: Internal Medicine | Admitting: Internal Medicine

## 2015-12-03 ENCOUNTER — Encounter (HOSPITAL_COMMUNITY)
Admission: RE | Admit: 2015-12-03 | Discharge: 2015-12-03 | Disposition: A | Discharge status: Home / Self Care | Payer: Self-pay | Source: Ambulatory Visit | Attending: Internal Medicine | Admitting: Internal Medicine

## 2015-12-07 ENCOUNTER — Encounter: Payer: Self-pay | Admitting: Cardiovascular Disease

## 2015-12-07 ENCOUNTER — Ambulatory Visit (INDEPENDENT_AMBULATORY_CARE_PROVIDER_SITE_OTHER): Payer: Medicare Other | Admitting: Cardiovascular Disease

## 2015-12-07 VITALS — BP 120/66 | HR 72 | Ht 66.0 in | Wt 209.4 lb

## 2015-12-07 DIAGNOSIS — I5032 Chronic diastolic (congestive) heart failure: Secondary | ICD-10-CM | POA: Diagnosis not present

## 2015-12-07 DIAGNOSIS — Z954 Presence of other heart-valve replacement: Secondary | ICD-10-CM | POA: Diagnosis not present

## 2015-12-07 DIAGNOSIS — J449 Chronic obstructive pulmonary disease, unspecified: Secondary | ICD-10-CM | POA: Diagnosis not present

## 2015-12-07 DIAGNOSIS — E669 Obesity, unspecified: Secondary | ICD-10-CM

## 2015-12-07 DIAGNOSIS — Z952 Presence of prosthetic heart valve: Secondary | ICD-10-CM

## 2015-12-07 NOTE — Patient Instructions (Signed)
Medication Instructions:   TAKE FUROSEMIDE 5 DAYS WEEKLY ONLY  Follow-Up:  Your physician wants you to follow-up in: [place] will receive a reminder letter in the mail two months in advance. If you don't receive a letter, please call our office to schedule the follow-up appointment.   If you need a refill on your cardiac medications before your next appointment, please call your pharmacy.

## 2015-12-07 NOTE — Progress Notes (Signed)
Patient ID: [name], female   DOB: Dec 14, 1945, 70 y.o.   MRN: [id number] Patient ID: [name], female   DOB: Jul 16, 1946, 70 y.o.   MRN: [id number]     Cardiology Office Note    Date:  12/07/2015   ID:  Diane, [name] 31-Jul-1946, MRN [id number]  PCP:  Ria Bush, MD  Cardiologist:   Sanda Klein, MD   Chief Complaint  Patient presents with  . Follow-up    no chest pain, has shortness of breath, no edema, no pain or cramping in legs, occassional lightheadedness or dizziness    History of Present Illness:  [name] is a 70 y.o. female with rheumatic valvular heart disease, COPD, morbid obesity, hyperlipidemia and continues to have exertional dyspnea, almost 2 years after combined AVR/MVR (bioprostheses) and good compliance with cardiopulmonary rehab.  She is still grieving from the double loss of her husband and her son.  She had improved breathing after starting bisoprolol (hyperdynamic LV with intracavitary gradient). She had an episode of acute bronchitis earlier this month and this has set her back. 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 She almost never wheezes and uses albuterol very rarely. BP is low, on one occasion 100/49 at rehab, 96/58 after exercise.  The aortic valve prosthesis is a 21 mm [employer] MagnaEase pericardial tissue valve and the mitral valve is a 25 mm device of the same make.  Echo 09/18/2015:  - Left ventricle: The cavity size was normal. There was mild focal basal hypertrophy of the septum. Systolic function was vigorous.The estimated ejection fraction was in the range of 65% to 70%. There was dynamic obstruction. Peak velocity - 4.54m/s peakvelocity with Valsalva, peak gradient 83mmHg. Wall motion was normal; there were no regional wall motion abnormalities. - Aortic valve: A bioprosthesis was present and functioningnormally. Transvalvular velocity was minimally increased.  There was no stenosis. Peak velocity (S): 224 cm/s. Peak gradient (S):20 mm Hg. - Mitral valve: Severely calcified annulus. A bioprosthesis waspresent and functioning normally. The findings are consistentwith mild to moderate stenosis. Mean gradient (D): 7 mm Hg. Peakgradient (D): 11 mm Hg. Valve area by pressure half-time: 1.76cm^2. - Left atrium: The atrium was moderately dilated.  Past Medical History  Diagnosis Date  . IBS (irritable bowel syndrome)   . Asthma   . Lung nodule     RLL nodule-11mm stable 2006, April 2009, and June 2009  . Anemia   . Hyperlipemia     takes Lovastatin daily  . Obesity   . Arthritis   . History of radiation therapy 05/30/11 to 07/07/11    rectum  . History of pneumonia     several times  . History of shingles   . Severe aortic valve stenosis  AVR 3/15 10/01/2013    mild-mod by echo 02/2014  . Rheumatic heart disease mitral stenosis     mod MS by echo 02/2014  . Anxiety   . S/P AVR (aortic valve replacement) 2015    bioprosthetic ([place])  . S/P MVR (mitral valve replacement) 2015    bioprosthetic ([place])  . DDD (degenerative disc disease)     cervical - kyphosis with mod DD changes C5/6 and C6/7; lumbar - early DD at L2/3 ([place])  . Chronic diastolic heart failure ([place]) 09/19/2014  . Small bowel obstruction, partial ([place]) 08/2013    reolved without NGT placement.   . Pancreatitis 11/2014    ?zpack related vs gallstone pancreatitis with  abnormal HIDA scan pending cholecystectomy  . Osteopenia 12/2014    T -1.5 hip  . GERD (gastroesophageal reflux disease)     takes Protonix daily  . Constipation     takes Miralax daily as needed  . COPD (chronic obstructive pulmonary disease) (HCC)     Albuterol inhaler daily as needed;Duoneb daily as needed;Spiriva daily  . PONV (postoperative nausea and vomiting)   . Essential hypertension     was on meds but after appointment Dec 11 with Dr.C he took her off  . DOE (dyspnea on exertion)   .  Pneumonia 10/2013  . Chronic back pain     stenosis  . Rectal cancer ([place]) 04/2011    T3N0; s/p lap LAR, s/p ileostomy, s/p reversal, s/p chemo  . History of blood transfusion   . Cataracts, bilateral     immature  . Chronic insomnia   . Sleep apnea     PT TOLD BORDERLINE-TRIED CPAP-DID NOT HELP-SHE DOES NOT USE CPAP  . Seizures ([place])     febrile seizures as a child  . Vitamin B12 deficiency 02/28/2015    Start B12 shots 02/2015   . Pancreatitis, acute 11/16/2014    Past Surgical History  Procedure Laterality Date  . Foot surgery  left foot    hammer toe  . Hand surgery  Bil    carpal tunnel  . Back surgery  2006, 2007    2006 SPACER, 2007 decompression and fusion L4/5  . Tonsillectomy    . Colon resection  08/25/2011    Procedure: COLON RESECTION LAPAROSCOPIC;  Surgeon: Stark Klein, MD;  Location: [facility];  Service: General;  Laterality: N/A;  Laparoscopic Assisted Low Anterior Resection Diverting Ostomy and onQ pain pump  . Ileostomy  08/25/2011  . Ileostomy closure  04/02/2012    Procedure: ILEOSTOMY TAKEDOWN;  Surgeon: Stark Klein, MD;  Location: [facility];  Service: General;  Laterality: N/A;  . Bowel resection  04/02/2012    Procedure: SMALL BOWEL RESECTION;  Surgeon: Stark Klein, MD;  Location: [facility];  Service: General;  Laterality: N/A;  . Carpal tunnel release Bilateral   . Tee without cardioversion N/A 11/29/2013    Procedure: TRANSESOPHAGEAL ECHOCARDIOGRAM (TEE);  Surgeon: Sueanne Margarita, MD;  Location: [facility];  Service: Cardiovascular;  Laterality: N/A;  . Aortic valve replacement N/A 12/12/2013    Procedure: AORTIC VALVE REPLACEMENT (AVR);  Surgeon: Gaye Pollack, MD; Service: Open Heart Surgery  . Mitral valve replacement N/A 12/12/2013    Procedure: MITRAL VALVE (MV) REPLACEMENT OR REPAIR;  Surgeon: Gaye Pollack, MD; Service: Open Heart Surgery  . Intraoperative transesophageal echocardiogram N/A 12/12/2013    Procedure: INTRAOPERATIVE TRANSESOPHAGEAL  ECHOCARDIOGRAM;  Surgeon: Gaye Pollack, MD;  Location: [facility];  Service: Open Heart Surgery;  Laterality: N/A;  . Colonoscopy  03/2013    1 polyp, rpt 3 yrs Ardis Hughs)  . Left and right heart catheterization with coronary angiogram N/A 11/27/2013    Procedure: LEFT AND RIGHT HEART CATHETERIZATION WITH CORONARY ANGIOGRAM;  Surgeon: Sanda Klein, MD;  Location: [facility];  Service: Cardiovascular;  Laterality: N/A;  . Cholecystectomy N/A 01/21/2015    chronic cholecystitis, Stark Klein, MD    Current Outpatient Prescriptions  Medication Sig Dispense Refill  . albuterol (PROVENTIL HFA;VENTOLIN HFA) 108 (90 BASE) MCG/ACT inhaler Inhale 2 puffs into the lungs every 6 (six) hours as needed for wheezing.    Marland Kitchen amoxicillin (AMOXIL) 500 MG capsule take 4 capsules by mouth 30 TO 60  MINUTES PRIOR TO DENTAL APPOINTMENT. 4 capsule 12  . aspirin 81 MG tablet Take 81 mg by mouth at bedtime.     Marland Kitchen BIOTIN PO Take 1 tablet by mouth at bedtime.     . bisoprolol (ZEBETA) 5 MG tablet Take 0.5 tablets (2.5 mg total) by mouth daily. 15 tablet 6  . Calcium Carbonate-Vitamin D (CALCIUM 600 + D PO) Take 600 mg by mouth at bedtime.     . Cholecalciferol (VITAMIN D) 2000 UNITS CAPS Take 1 capsule by mouth at bedtime.     . Fluticasone Furoate-Vilanterol (BREO ELLIPTA) 200-25 MCG/INH AEPB Inhale 1 puff into the lungs daily. 1 each 6  . furosemide (LASIX) 40 MG tablet Take 1 tablet (40 mg total) by mouth daily. 30 tablet 11  . ipratropium-albuterol (DUONEB) 0.5-2.5 (3) MG/3ML SOLN Take 3 mLs by nebulization every 6 (six) hours as needed. 360 mL 0  . Lorcaserin HCl (BELVIQ) 10 MG TABS Take 10 mg by mouth daily. 30 tablet 3  . lovastatin (MEVACOR) 40 MG tablet take 1 tablet at bedtime 30 tablet 6  . Miconazole Nitrate 2 % POWD 1 application by Does not apply route daily as needed (for skin). Apply to stomach and under breasts    . montelukast (SINGULAIR) 10 MG tablet take 1 tablet by mouth at bedtime 30 tablet 5  .  Multiple Vitamins-Minerals (PRESERVISION AREDS 2 PO) Take 1 capsule by mouth daily.    . pantoprazole (PROTONIX) 40 MG tablet Take 1 tablet (40 mg total) by mouth at bedtime. 30 tablet 11  . polyethylene glycol (MIRALAX / GLYCOLAX) packet Take 17 g by mouth daily. (Patient taking differently: Take 17 g by mouth daily as needed. ) 14 each 0  . potassium chloride (K-DUR,KLOR-CON) 10 MEQ tablet Take 2 tablets (20 mEq total) by mouth daily. 60 tablet 11  . sertraline (ZOLOFT) 25 MG tablet Take 1 tablet (25 mg total) by mouth daily. 30 tablet 6  . solifenacin (VESICARE) 10 MG tablet Take 1 tablet (10 mg total) by mouth daily. 30 tablet 11  . SPIRIVA HANDIHALER 18 MCG inhalation capsule inhale the contents of one capsule in the handihaler once daily 30 capsule 4  . traMADol (ULTRAM) 50 MG tablet Take 1 tablet (50 mg total) by mouth every 6 (six) hours as needed for moderate pain. 30 tablet 0  . traZODone (DESYREL) 50 MG tablet Take 1 tablet (50 mg total) by mouth at bedtime. 30 tablet 11  . vitamin B-12 (CYANOCOBALAMIN) 500 MCG tablet Take 500 mcg by mouth daily.     No current facility-administered medications for this visit.    Allergies:   Codeine; Prednisone; and Sulfonamide derivatives   Social History   Social History  . Marital Status: Married    Spouse Name: N/A  . Number of Children: N/A  . Years of Education: N/A   Occupational History  . retired    Social History Main Topics  . Smoking status: Former Smoker -- 1.50 packs/day for 10 years    Types: Cigarettes    Quit date: 12/28/1974  . Smokeless tobacco: Never Used     Comment: quit smoking in 1976  . Alcohol Use: 0.0 oz/week    0 Standard drinks or equivalent per week     Comment: rarely wine  . Drug Use: No  . Sexual Activity: No   Other Topics Concern  . None   Social History Narrative   Widow. Husband died 03-20-15 MVA. 1 cat.  2 sons; 1 grandchild. One son died of MI 21   Married January 10, 1965   Retired-[employer],  volunteers at [employer]   Activity: pulm rehab   Diet: some water, fruits/vegetables daily     Family History:  The patient's family history includes CAD (age of onset: 80) in her son; Cancer in her father; Diabetes in her mother; Heart attack in her maternal grandfather; Heart disease in her mother; Hypertension in her mother; Stroke in her mother. There is no history of Colon cancer, Esophageal cancer, Stomach cancer, or Rectal cancer.   ROS:   Please see the history of present illness.    Review of Systems  All other systems reviewed and are negative.  All other systems reviewed and are negative.   PHYSICAL EXAM:   VS:  BP 120/66 mmHg  Pulse 72  Ht 5\' 6"  (1.676 m)  Wt 95 kg (209 lb 7 oz)  BMI 33.82 kg/m2   GEN: Well nourished, well developed, in no acute distress HEENT: normal Neck: no JVD, carotid bruits, or masses Cardiac: RRR; 2/6 early peaking aortic ejection murmur; no diastolic murmurs, rubs, or gallops,no edema  Respiratory:  clear to auscultation bilaterally, normal work of breathing GI: soft, nontender, nondistended, + BS MS: no deformity or atrophy Skin: warm and dry, no rash Neuro:  Alert and Oriented x 3, Strength and sensation are intact Psych: euthymic mood, full affect  Wt Readings from Last 3 Encounters:  12/07/15 95 kg (209 lb 7 oz)  11/16/15 96.253 kg (212 lb 3.2 oz)  10/28/15 96.843 kg (213 lb 8 oz)      Studies/Labs Reviewed:   EKG:  EKG is not ordered today.    Recent Labs: 01/22/2015: Magnesium 2.0 02/25/2015: ALT 13 06/26/2015: Pro B Natriuretic peptide (BNP) 204.0* 10/23/2015: BUN 22; Creatinine, Ser 1.24*; Hemoglobin 11.4*; Platelets 155.0; Potassium 4.0; Sodium 142   Lipid Panel    Component Value Date/Time   CHOL 154 10/23/2015 0903   TRIG 62.0 10/23/2015 0903   HDL 49.70 10/23/2015 0903   CHOLHDL 3 10/23/2015 0903   VLDL 12.4 10/23/2015 0903   LDLCALC 91 10/23/2015 0903   LDLDIRECT 111.0 06/26/2015 0928    ASSESSMENT:    1. Chronic  diastolic heart failure ([place])   2. S/P aortic and mitral valve bioprostheses - 12/2013   3. Obesity, Class I, BMI 30-34.9   4. Chronic obstructive pulmonary disease, unspecified COPD type ([place])      PLAN:  In order of problems listed above:  1. Chronic diastolic heart failure (HCC) , NYHA class 2: Improved with beta blocker. BP is relatively low, which limits beta blocker dose. Will reduce diuretic to 5 days weekly 2. AVR/MVR. Aortic valve gradients are normal. There is a higher than expected mean gradient across the mitral valve, beta blockers may help by prolonging diastolic filling time. 3. Obesity. I am sure this is playing a substantial role in her dyspnea. Weight loss encouraged. She asked again about anorexiant medications (lorcaserin), but I think those would not be a good choice for her. 4. COPD. No change in wheezing or worsening respiratory status on bisoprolol.   Medication Adjustments/Labs and Tests Ordered: Current medicines are reviewed at length with the patient today.  Concerns regarding medicines are outlined above.  Medication changes, Labs and Tests ordered today are listed below. Patient Instructions  Medication Instructions:   TAKE FUROSEMIDE 5 DAYS WEEKLY ONLY  Follow-Up:  Your physician wants you to follow-up in: [place] will  receive a reminder letter in the mail two months in advance. If you don't receive a letter, please call our office to schedule the follow-up appointment.   If you need a refill on your cardiac medications before your next appointment, please call your pharmacy.         Mikael Spray, MD  12/07/2015 9:40 AM    [facility] [address], [place], [place]  [address] Phone: [phone]; Fax: [phone]

## 2015-12-08 ENCOUNTER — Encounter (HOSPITAL_COMMUNITY)
Admission: RE | Admit: 2015-12-08 | Discharge: 2015-12-08 | Disposition: A | Discharge status: Home / Self Care | Payer: Self-pay | Source: Ambulatory Visit | Attending: Internal Medicine | Admitting: Internal Medicine

## 2015-12-10 ENCOUNTER — Encounter (HOSPITAL_COMMUNITY)
Admission: RE | Admit: 2015-12-10 | Discharge: 2015-12-10 | Disposition: A | Discharge status: Home / Self Care | Payer: Self-pay | Source: Ambulatory Visit | Attending: Internal Medicine | Admitting: Internal Medicine

## 2015-12-10 DIAGNOSIS — R0600 Dyspnea, unspecified: Secondary | ICD-10-CM | POA: Insufficient documentation

## 2015-12-10 DIAGNOSIS — Z954 Presence of other heart-valve replacement: Secondary | ICD-10-CM | POA: Insufficient documentation

## 2015-12-15 ENCOUNTER — Encounter (HOSPITAL_COMMUNITY)
Admission: RE | Admit: 2015-12-15 | Discharge: 2015-12-15 | Disposition: A | Discharge status: Home / Self Care | Payer: Self-pay | Source: Ambulatory Visit | Attending: Internal Medicine | Admitting: Internal Medicine

## 2015-12-17 ENCOUNTER — Encounter (HOSPITAL_COMMUNITY): Payer: Self-pay

## 2015-12-21 ENCOUNTER — Telehealth: Payer: Self-pay | Admitting: Family Medicine

## 2015-12-21 NOTE — Telephone Encounter (Signed)
[facility]  Patient Name: [name]  DOB: 10/18/1945    Initial Comment caller states she thinks she has a UTI- is taking the azo - but when she stops is still having the frequency   Nurse Assessment  Nurse: Wynetta Emery, RN, Baker Janus Date/Time Eilene Ghazi Time): 12/21/2015 9:28:31 AM  Confirm and document reason for call. If symptomatic, describe symptoms. You must click the next button to save text entered. ---Pamela has had urgency and frequency of urine x 2 days going to br and continues to have the pressure and is dribbling at this time.  Has the patient traveled out of the country within the last 30 days? ---No  Does the patient have any new or worsening symptoms? ---Yes  Will a triage be completed? ---Yes  Related visit to physician within the last 2 weeks? ---No  Does the PT have any chronic conditions? (i.e. diabetes, asthma, etc.) ---Unknown  Is this a behavioral health or substance abuse call? ---No     Guidelines    Guideline Title Affirmed Question Affirmed Notes  Urinary Symptoms Urinating more frequently than usual (i.e., frequency)    Final Disposition User   See Physician within 24 Hours Wynetta Emery, RN, Baker Janus    Comments  NOTE; APPT SCHEDULED; 3/14-2017 400pm Dr. Biagio Borg r/o UTI   Referrals  REFERRED TO PCP OFFICE   Disagree/Comply: Comply

## 2015-12-21 NOTE — Telephone Encounter (Signed)
Pt has appt 12/22/15 at 4 PM with Dr Darnell Level.

## 2015-12-22 ENCOUNTER — Encounter: Payer: Self-pay | Admitting: Family Medicine

## 2015-12-22 ENCOUNTER — Ambulatory Visit (INDEPENDENT_AMBULATORY_CARE_PROVIDER_SITE_OTHER): Payer: Medicare Other | Admitting: Family Medicine

## 2015-12-22 ENCOUNTER — Encounter (HOSPITAL_COMMUNITY)
Admission: RE | Admit: 2015-12-22 | Discharge: 2015-12-22 | Disposition: A | Discharge status: Home / Self Care | Payer: Self-pay | Source: Ambulatory Visit | Attending: Internal Medicine | Admitting: Internal Medicine

## 2015-12-22 VITALS — BP 134/74 | HR 80 | Temp 98.2°F | Wt 215.5 lb

## 2015-12-22 DIAGNOSIS — N3000 Acute cystitis without hematuria: Secondary | ICD-10-CM

## 2015-12-22 DIAGNOSIS — R21 Rash and other nonspecific skin eruption: Secondary | ICD-10-CM | POA: Insufficient documentation

## 2015-12-22 DIAGNOSIS — F4321 Adjustment disorder with depressed mood: Secondary | ICD-10-CM | POA: Diagnosis not present

## 2015-12-22 DIAGNOSIS — J449 Chronic obstructive pulmonary disease, unspecified: Secondary | ICD-10-CM

## 2015-12-22 DIAGNOSIS — J441 Chronic obstructive pulmonary disease with (acute) exacerbation: Secondary | ICD-10-CM

## 2015-12-22 HISTORY — DX: Acute cystitis without hematuria: N30.00

## 2015-12-22 LAB — POC URINALSYSI DIPSTICK (AUTOMATED)
Bilirubin, UA: NEGATIVE
Blood, UA: NEGATIVE
Glucose, UA: NEGATIVE
Ketones, UA: NEGATIVE
Nitrite, UA: NEGATIVE
Protein, UA: NEGATIVE
Spec Grav, UA: 1.02
Urobilinogen, UA: 1
pH, UA: 6

## 2015-12-22 MED ORDER — CIPROFLOXACIN HCL 250 MG PO TABS: 250.0000 mg | ORAL_TABLET | Freq: Two times a day (BID) | ORAL | Status: DC

## 2015-12-22 MED ORDER — TRIAMCINOLONE ACETONIDE 0.1 % EX CREA: 1.0000 "application " | TOPICAL_CREAM | Freq: Two times a day (BID) | CUTANEOUS | Status: DC

## 2015-12-22 MED ORDER — METHYLPREDNISOLONE 4 MG PO TBPK: ORAL_TABLET | ORAL | Status: DC

## 2015-12-22 NOTE — Assessment & Plan Note (Signed)
Improving sxs - better able to sleep. On sertraline, trazodone.

## 2015-12-22 NOTE — Assessment & Plan Note (Signed)
Failed antibiotic cream and miconazole powder.  Anticipate dry skin dermatitis vs seborrheic dermatitis given some scaling and h/o same on head.  Treat with triamcinolone cream to rash. Update if not improving with treatment.

## 2015-12-22 NOTE — Progress Notes (Signed)
BP 134/74 mmHg  Pulse 80  Temp(Src) 98.2 F (36.8 C) (Oral)  Wt 215 lb 8 oz (97.75 kg)   CC: UTI?, check skin, cough Subjective:    Patient ID: [name], female    DOB: 1946-10-07, 70 y.o.   MRN: [id number]  HPI: [name] is a 69 y.o. female presenting on 12/22/2015 for Urinary Tract Infection and Skin check   5d h/o lower abdominal discomfort and urgency associated with stinging and dysuria. Self treated with azo. No hematuria. No nausea/vomiting or flank pain or fever. No recent abx use.   New rash to chest, R forearm and R shoulder. Itchy then tender. Chest rash started 1 mo ago, arm rash 1 wk ago. Self treated with abx cream, moisturizing lotion and miconazole powder. Feels this has not helped. No new lotions, detergents, soaps or shampoos. No new medicines. No new foods.   Ongoing cough and PNdrainage since bronchitis 3 wks ago. Saw Dr Lynford Citizen for this - treated with doxycycline and solumedrol. Taking OTC sinus remedy without improvement. Mild headache. No fevers, head congestion or ST. H/o COPD.   Relevant past medical, surgical, family and social history reviewed and updated as indicated. Interim medical history since our last visit reviewed. Allergies and medications reviewed and updated. Current Outpatient Prescriptions on File Prior to Visit  Medication Sig  . albuterol (PROVENTIL HFA;VENTOLIN HFA) 108 (90 BASE) MCG/ACT inhaler Inhale 2 puffs into the lungs every 6 (six) hours as needed for wheezing.  Marland Kitchen amoxicillin (AMOXIL) 500 MG capsule take 4 capsules by mouth 30 TO 60 MINUTES PRIOR TO DENTAL APPOINTMENT.  Marland Kitchen aspirin 81 MG tablet Take 81 mg by mouth at bedtime.   Marland Kitchen BIOTIN PO Take 1 tablet by mouth at bedtime.   . bisoprolol (ZEBETA) 5 MG tablet Take 0.5 tablets (2.5 mg total) by mouth daily.  . Calcium Carbonate-Vitamin D (CALCIUM 600 + D PO) Take 600 mg by mouth at bedtime.   . Cholecalciferol (VITAMIN D) 2000 UNITS CAPS Take 1 capsule by mouth at  bedtime.   . Fluticasone Furoate-Vilanterol (BREO ELLIPTA) 200-25 MCG/INH AEPB Inhale 1 puff into the lungs daily.  . furosemide (LASIX) 40 MG tablet Take 1 tablet (40 mg total) by mouth daily.  Marland Kitchen ipratropium-albuterol (DUONEB) 0.5-2.5 (3) MG/3ML SOLN Take 3 mLs by nebulization every 6 (six) hours as needed.  . lovastatin (MEVACOR) 40 MG tablet take 1 tablet at bedtime  . Miconazole Nitrate 2 % POWD 1 application by Does not apply route daily as needed (for skin). Apply to stomach and under breasts  . montelukast (SINGULAIR) 10 MG tablet take 1 tablet by mouth at bedtime  . Multiple Vitamins-Minerals (PRESERVISION AREDS 2 PO) Take 1 capsule by mouth daily.  . pantoprazole (PROTONIX) 40 MG tablet Take 1 tablet (40 mg total) by mouth at bedtime.  . polyethylene glycol (MIRALAX / GLYCOLAX) packet Take 17 g by mouth daily. (Patient taking differently: Take 17 g by mouth daily as needed. )  . potassium chloride (K-DUR,KLOR-CON) 10 MEQ tablet Take 2 tablets (20 mEq total) by mouth daily.  . sertraline (ZOLOFT) 25 MG tablet Take 1 tablet (25 mg total) by mouth daily.  . solifenacin (VESICARE) 10 MG tablet Take 1 tablet (10 mg total) by mouth daily.  Marland Kitchen SPIRIVA HANDIHALER 18 MCG inhalation capsule inhale the contents of one capsule in the handihaler once daily  . traMADol (ULTRAM) 50 MG tablet Take 1 tablet (50 mg total) by mouth every 6 (six) hours  as needed for moderate pain.  . traZODone (DESYREL) 50 MG tablet Take 1 tablet (50 mg total) by mouth at bedtime.  . vitamin B-12 (CYANOCOBALAMIN) 500 MCG tablet Take 500 mcg by mouth daily.   No current facility-administered medications on file prior to visit.    Review of Systems Per HPI unless specifically indicated in ROS section     Objective:    BP 134/74 mmHg  Pulse 80  Temp(Src) 98.2 F (36.8 C) (Oral)  Wt 215 lb 8 oz (97.75 kg)  Wt Readings from Last 3 Encounters:  12/22/15 215 lb 8 oz (97.75 kg)  12/07/15 209 lb 7 oz (95 kg)  11/16/15  212 lb 3.2 oz (96.253 kg)    Physical Exam  Constitutional: She appears well-developed and well-nourished. No distress.  HENT:  Head: Normocephalic and atraumatic.  Right Ear: Hearing, tympanic membrane, external ear and ear canal normal.  Left Ear: Hearing, tympanic membrane, external ear and ear canal normal.  Nose: No mucosal edema or rhinorrhea. Right sinus exhibits no maxillary sinus tenderness and no frontal sinus tenderness. Left sinus exhibits no maxillary sinus tenderness and no frontal sinus tenderness.  Mouth/Throat: Uvula is midline, oropharynx is clear and moist and mucous membranes are normal. No oropharyngeal exudate, posterior oropharyngeal edema, posterior oropharyngeal erythema or tonsillar abscesses.  Eyes: Conjunctivae and EOM are normal. Pupils are equal, round, and reactive to light. No scleral icterus.  Neck: Normal range of motion. Neck supple.  Cardiovascular: Normal rate, regular rhythm and intact distal pulses.   Murmur (systolic artificial murmur) heard. Pulmonary/Chest: Effort normal. No respiratory distress. She has wheezes (diffuse mild exp wheezing throughout). She has no rales.  Abdominal: Soft. Bowel sounds are normal. She exhibits no distension and no mass. There is no tenderness. There is no rebound, no guarding and no CVA tenderness.  Lymphadenopathy:    She has no cervical adenopathy.  Skin: Skin is warm and dry. Rash noted.  Dry rough skin to R AC fossa, rough patch on R lateral chest, and erythematous scaly patch on anterior superior chest  Psychiatric: She has a normal mood and affect.  Nursing note and vitals reviewed.  Results for orders placed or performed in visit on 12/22/15  POCT Urinalysis Dipstick (Automated)  Result Value Ref Range   Color, UA Orange    Clarity, UA Hazy    Glucose, UA Negative    Bilirubin, UA Negative    Ketones, UA Negative    Spec Grav, UA 1.020    Blood, UA Negative    pH, UA 6.0    Protein, UA Negative     Urobilinogen, UA 1.0    Nitrite, UA Negative    Leukocytes, UA small (1+) (A) Negative   *Note: Due to a large number of results and/or encounters for the requested time period, some results have not been displayed. A complete set of results can be found in Results Review.   Lab Results  Component Value Date   CREATININE 1.24* 10/23/2015       Assessment & Plan:   Problem List Items Addressed This Visit    Skin rash    Failed antibiotic cream and miconazole powder.  Anticipate dry skin dermatitis vs seborrheic dermatitis given some scaling and h/o same on head.  Treat with triamcinolone cream to rash. Update if not improving with treatment.      COPD exacerbation (HCC)    Anticipate mild exacerbation with increased wheezing and persistent cough. Treat with medrol dose pack -  in allergy to prednisone (hives).       Relevant Medications   methylPREDNISolone (MEDROL DOSEPAK) 4 MG TBPK tablet   COPD (chronic obstructive pulmonary disease) (HCC)   Relevant Medications   methylPREDNISolone (MEDROL DOSEPAK) 4 MG TBPK tablet   Adjustment disorder with depressed mood    Improving sxs - better able to sleep. On sertraline, trazodone.      Acute cystitis without hematuria - Primary    Micro with 1+ LE.  Send UCx  Start cipro 250mg  BID x5 d.      Relevant Orders   POCT Urinalysis Dipstick (Automated) (Completed)   Urine culture       Follow up plan: No Follow-up on file.

## 2015-12-22 NOTE — Assessment & Plan Note (Signed)
Micro with 1+ LE.  Send UCx  Start cipro 250mg  BID x5 d.

## 2015-12-22 NOTE — Progress Notes (Signed)
Pre visit review using our clinic review tool, if applicable. No additional management support is needed unless otherwise documented below in the visit note. 

## 2015-12-22 NOTE — Patient Instructions (Addendum)
For skin rash - I think this is from dry skin dermatitis. Treat with continued moisturizing cream and start triamcinolone steroid cream sent to pharmacy to spots. If spreading or worsening on steroid, stop and let me know. For UTI - treat with cipro antibiotic sent to pharmacy For cough - treat with solumedrol course sent to pharmacy. Let us know if not improved with treatment.

## 2015-12-22 NOTE — Assessment & Plan Note (Signed)
Anticipate mild exacerbation with increased wheezing and persistent cough. Treat with medrol dose pack - in allergy to prednisone (hives).

## 2015-12-24 ENCOUNTER — Encounter (HOSPITAL_COMMUNITY): Payer: Self-pay

## 2015-12-25 LAB — URINE CULTURE: Colony Count: >=100000

## 2015-12-29 ENCOUNTER — Encounter (HOSPITAL_COMMUNITY)
Admission: RE | Admit: 2015-12-29 | Discharge: 2015-12-29 | Disposition: A | Discharge status: Home / Self Care | Payer: Self-pay | Source: Ambulatory Visit | Attending: Internal Medicine | Admitting: Internal Medicine

## 2015-12-29 ENCOUNTER — Other Ambulatory Visit: Payer: Self-pay

## 2015-12-29 DIAGNOSIS — Z1231 Encounter for screening mammogram for malignant neoplasm of breast: Secondary | ICD-10-CM

## 2015-12-31 ENCOUNTER — Encounter (HOSPITAL_COMMUNITY)
Admission: RE | Admit: 2015-12-31 | Discharge: 2015-12-31 | Disposition: A | Discharge status: Home / Self Care | Payer: Self-pay | Source: Ambulatory Visit | Attending: Internal Medicine | Admitting: Internal Medicine

## 2016-01-05 ENCOUNTER — Encounter (HOSPITAL_COMMUNITY)
Admission: RE | Admit: 2016-01-05 | Discharge: 2016-01-05 | Disposition: A | Discharge status: Home / Self Care | Payer: Self-pay | Source: Ambulatory Visit | Attending: Internal Medicine | Admitting: Internal Medicine

## 2016-01-07 ENCOUNTER — Encounter (HOSPITAL_COMMUNITY)
Admission: RE | Admit: 2016-01-07 | Discharge: 2016-01-07 | Disposition: A | Discharge status: Home / Self Care | Payer: Self-pay | Source: Ambulatory Visit | Attending: Internal Medicine | Admitting: Internal Medicine

## 2016-01-12 ENCOUNTER — Encounter (HOSPITAL_COMMUNITY)
Admission: RE | Admit: 2016-01-12 | Discharge: 2016-01-12 | Disposition: A | Discharge status: Home / Self Care | Payer: Self-pay | Source: Ambulatory Visit | Attending: Internal Medicine | Admitting: Internal Medicine

## 2016-01-12 DIAGNOSIS — Z954 Presence of other heart-valve replacement: Secondary | ICD-10-CM | POA: Insufficient documentation

## 2016-01-12 DIAGNOSIS — R0600 Dyspnea, unspecified: Secondary | ICD-10-CM | POA: Insufficient documentation

## 2016-01-14 ENCOUNTER — Encounter (HOSPITAL_COMMUNITY): Admission: RE | Admit: 2016-01-14 | Payer: Self-pay | Source: Ambulatory Visit

## 2016-01-15 ENCOUNTER — Encounter: Payer: Self-pay | Admitting: Internal Medicine

## 2016-01-18 ENCOUNTER — Encounter: Payer: Self-pay | Admitting: Gastroenterology

## 2016-01-18 MED ORDER — ALBUTEROL SULFATE HFA 108 (90 BASE) MCG/ACT IN AERS: 2.0000 | INHALATION_SPRAY | Freq: Four times a day (QID) | RESPIRATORY_TRACT | Status: DC | PRN

## 2016-01-19 ENCOUNTER — Encounter: Payer: Self-pay | Admitting: Gastroenterology

## 2016-01-19 ENCOUNTER — Encounter (HOSPITAL_COMMUNITY)
Admission: RE | Admit: 2016-01-19 | Discharge: 2016-01-19 | Disposition: A | Discharge status: Home / Self Care | Payer: Self-pay | Source: Ambulatory Visit | Attending: Internal Medicine | Admitting: Internal Medicine

## 2016-01-21 ENCOUNTER — Encounter (HOSPITAL_COMMUNITY)
Admission: RE | Admit: 2016-01-21 | Discharge: 2016-01-21 | Disposition: A | Discharge status: Home / Self Care | Payer: Self-pay | Source: Ambulatory Visit | Attending: Internal Medicine | Admitting: Internal Medicine

## 2016-01-26 ENCOUNTER — Encounter (HOSPITAL_COMMUNITY)
Admission: RE | Admit: 2016-01-26 | Discharge: 2016-01-26 | Disposition: A | Discharge status: Home / Self Care | Payer: Self-pay | Source: Ambulatory Visit | Attending: Internal Medicine | Admitting: Internal Medicine

## 2016-01-28 ENCOUNTER — Encounter (HOSPITAL_COMMUNITY): Payer: Self-pay

## 2016-01-31 ENCOUNTER — Encounter (HOSPITAL_COMMUNITY): Payer: Self-pay | Admitting: Family Medicine

## 2016-01-31 ENCOUNTER — Emergency Department (HOSPITAL_COMMUNITY): Payer: Medicare Other

## 2016-01-31 ENCOUNTER — Emergency Department (HOSPITAL_COMMUNITY)
Admission: EM | Admit: 2016-01-31 | Discharge: 2016-01-31 | Disposition: A | Discharge status: Home / Self Care | Payer: Medicare Other | Attending: Emergency Medicine | Admitting: Emergency Medicine

## 2016-01-31 DIAGNOSIS — F419 Anxiety disorder, unspecified: Secondary | ICD-10-CM | POA: Insufficient documentation

## 2016-01-31 DIAGNOSIS — I10 Essential (primary) hypertension: Secondary | ICD-10-CM | POA: Insufficient documentation

## 2016-01-31 DIAGNOSIS — E785 Hyperlipidemia, unspecified: Secondary | ICD-10-CM | POA: Diagnosis not present

## 2016-01-31 DIAGNOSIS — Z79899 Other long term (current) drug therapy: Secondary | ICD-10-CM | POA: Insufficient documentation

## 2016-01-31 DIAGNOSIS — K219 Gastro-esophageal reflux disease without esophagitis: Secondary | ICD-10-CM | POA: Insufficient documentation

## 2016-01-31 DIAGNOSIS — J189 Pneumonia, unspecified organism: Secondary | ICD-10-CM

## 2016-01-31 DIAGNOSIS — Z792 Long term (current) use of antibiotics: Secondary | ICD-10-CM | POA: Diagnosis not present

## 2016-01-31 DIAGNOSIS — Z87891 Personal history of nicotine dependence: Secondary | ICD-10-CM | POA: Diagnosis not present

## 2016-01-31 DIAGNOSIS — Z7982 Long term (current) use of aspirin: Secondary | ICD-10-CM | POA: Diagnosis not present

## 2016-01-31 DIAGNOSIS — Z8701 Personal history of pneumonia (recurrent): Secondary | ICD-10-CM | POA: Insufficient documentation

## 2016-01-31 DIAGNOSIS — Z923 Personal history of irradiation: Secondary | ICD-10-CM | POA: Insufficient documentation

## 2016-01-31 DIAGNOSIS — K5900 Constipation, unspecified: Secondary | ICD-10-CM | POA: Diagnosis not present

## 2016-01-31 DIAGNOSIS — J159 Unspecified bacterial pneumonia: Secondary | ICD-10-CM | POA: Insufficient documentation

## 2016-01-31 DIAGNOSIS — Z85048 Personal history of other malignant neoplasm of rectum, rectosigmoid junction, and anus: Secondary | ICD-10-CM | POA: Diagnosis not present

## 2016-01-31 DIAGNOSIS — I5032 Chronic diastolic (congestive) heart failure: Secondary | ICD-10-CM | POA: Insufficient documentation

## 2016-01-31 DIAGNOSIS — R05 Cough: Secondary | ICD-10-CM

## 2016-01-31 DIAGNOSIS — E538 Deficiency of other specified B group vitamins: Secondary | ICD-10-CM | POA: Insufficient documentation

## 2016-01-31 DIAGNOSIS — Z9889 Other specified postprocedural states: Secondary | ICD-10-CM | POA: Diagnosis not present

## 2016-01-31 DIAGNOSIS — E669 Obesity, unspecified: Secondary | ICD-10-CM | POA: Insufficient documentation

## 2016-01-31 DIAGNOSIS — R059 Cough, unspecified: Secondary | ICD-10-CM

## 2016-01-31 DIAGNOSIS — M1990 Unspecified osteoarthritis, unspecified site: Secondary | ICD-10-CM | POA: Insufficient documentation

## 2016-01-31 DIAGNOSIS — Z7951 Long term (current) use of inhaled steroids: Secondary | ICD-10-CM | POA: Insufficient documentation

## 2016-01-31 DIAGNOSIS — G8929 Other chronic pain: Secondary | ICD-10-CM | POA: Diagnosis not present

## 2016-01-31 DIAGNOSIS — R0602 Shortness of breath: Secondary | ICD-10-CM | POA: Diagnosis present

## 2016-01-31 DIAGNOSIS — J441 Chronic obstructive pulmonary disease with (acute) exacerbation: Secondary | ICD-10-CM

## 2016-01-31 LAB — BASIC METABOLIC PANEL
Anion gap: 14 (ref 5–15)
BUN: 14 mg/dL (ref 6–20)
CO2: 22 mmol/L (ref 22–32)
Calcium: 9.2 mg/dL (ref 8.9–10.3)
Chloride: 105 mmol/L (ref 101–111)
Creatinine, Ser: 1.11 mg/dL — ABNORMAL HIGH (ref 0.44–1.00)
GFR calc Af Amer: 57 mL/min — ABNORMAL LOW (ref >60)
GFR calc non Af Amer: 49 mL/min — ABNORMAL LOW (ref >60)
Glucose, Bld: 126 mg/dL — ABNORMAL HIGH (ref 65–99)
Potassium: 3.8 mmol/L (ref 3.5–5.1)
Sodium: 141 mmol/L (ref 135–145)

## 2016-01-31 LAB — CBC
HCT: 38.3 % (ref 36.0–46.0)
Hemoglobin: 11.8 g/dL — ABNORMAL LOW (ref 12.0–15.0)
MCH: 28.3 pg (ref 26.0–34.0)
MCHC: 30.8 g/dL (ref 30.0–36.0)
MCV: 91.8 fL (ref 78.0–100.0)
Platelets: 144 10*3/uL — ABNORMAL LOW (ref 150–400)
RBC: 4.17 MIL/uL (ref 3.87–5.11)
RDW: 15.5 % (ref 11.5–15.5)
WBC: 13 10*3/uL — ABNORMAL HIGH (ref 4.0–10.5)

## 2016-01-31 MED ORDER — METHYLPREDNISOLONE SODIUM SUCC 125 MG IJ SOLR
125.0000 mg | Freq: Once | INTRAMUSCULAR | Status: AC
  Administered 2016-01-31: 125 mg via INTRAVENOUS
  Filled 2016-01-31: qty 2

## 2016-01-31 MED ORDER — METHYLPREDNISOLONE 4 MG PO TBPK: ORAL_TABLET | ORAL | Status: DC

## 2016-01-31 MED ORDER — IPRATROPIUM-ALBUTEROL 0.5-2.5 (3) MG/3ML IN SOLN
3.0000 mL | Freq: Once | RESPIRATORY_TRACT | Status: AC
  Administered 2016-01-31: 3 mL via RESPIRATORY_TRACT
  Filled 2016-01-31: qty 3

## 2016-01-31 MED ORDER — LEVOFLOXACIN IN D5W 750 MG/150ML IV SOLN
750.0000 mg | Freq: Once | INTRAVENOUS | Status: AC
  Administered 2016-01-31: 750 mg via INTRAVENOUS
  Filled 2016-01-31: qty 150

## 2016-01-31 MED ORDER — ALBUTEROL SULFATE (2.5 MG/3ML) 0.083% IN NEBU
INHALATION_SOLUTION | RESPIRATORY_TRACT | Status: AC
  Filled 2016-01-31: qty 3

## 2016-01-31 MED ORDER — LEVOFLOXACIN 500 MG PO TABS: 500.0000 mg | ORAL_TABLET | Freq: Every day | ORAL | Status: DC

## 2016-01-31 MED ORDER — ALBUTEROL SULFATE (2.5 MG/3ML) 0.083% IN NEBU
5.0000 mg | INHALATION_SOLUTION | Freq: Once | RESPIRATORY_TRACT | Status: AC
  Administered 2016-01-31: 5 mg via RESPIRATORY_TRACT

## 2016-01-31 NOTE — ED Provider Notes (Signed)
CSN: [id number]     Arrival date & time 01/31/16  1335 History   First MD Initiated Contact with Patient 01/31/16 1525     Chief Complaint  Patient presents with  . Shortness of Breath     (Consider location/radiation/quality/duration/timing/severity/associated sxs/prior Treatment) Patient is a 70 y.o. female presenting with shortness of breath. The history is provided by the patient.  Shortness of Breath Severity:  Moderate Onset quality:  Gradual Duration:  4 days Timing:  Constant Progression:  Worsening Chronicity:  New Context: URI   Relieved by:  Nothing Worsened by:  Nothing tried Ineffective treatments:  Inhaler (spiriva and breo and albuterol) Associated symptoms: cough   Associated symptoms: no abdominal pain, no chest pain, no fever, no hemoptysis and no vomiting   Risk factors: obesity   Risk factors comment:  Asthma   Past Medical History  Diagnosis Date  . IBS (irritable bowel syndrome)   . Asthma   . Lung nodule     RLL nodule-34mm stable 2006, April 2009, and June 2009  . Anemia   . Hyperlipemia     takes Lovastatin daily  . Obesity   . Arthritis   . History of radiation therapy 05/30/11 to 07/07/11    rectum  . History of pneumonia     several times  . History of shingles   . Severe aortic valve stenosis  AVR 3/15 10/01/2013    mild-mod by echo 02/2014  . Rheumatic heart disease mitral stenosis     mod MS by echo 02/2014  . Anxiety   . S/P AVR (aortic valve replacement) 2015    bioprosthetic ([place])  . S/P MVR (mitral valve replacement) 2015    bioprosthetic ([place])  . DDD (degenerative disc disease)     cervical - kyphosis with mod DD changes C5/6 and C6/7; lumbar - early DD at L2/3 ([place])  . Chronic diastolic heart failure ([place]) 09/19/2014  . Small bowel obstruction, partial ([place]) 08/2013    reolved without NGT placement.   . Pancreatitis 11/2014    ?zpack related vs gallstone pancreatitis with abnormal HIDA scan pending cholecystectomy  .  Osteopenia 12/2014    T -1.5 hip  . GERD (gastroesophageal reflux disease)     takes Protonix daily  . Constipation     takes Miralax daily as needed  . COPD (chronic obstructive pulmonary disease) ([facility])     Albuterol inhaler daily as needed;Duoneb daily as needed;Spiriva daily  . PONV (postoperative nausea and vomiting)   . Essential hypertension     was on meds but after appointment Dec 11 with Dr.C he took her off  . DOE (dyspnea on exertion)   . Pneumonia 10/2013  . Chronic back pain     stenosis  . Rectal cancer ([place]) 04/2011    T3N0; s/p lap LAR, s/p ileostomy, s/p reversal, s/p chemo  . History of blood transfusion   . Cataracts, bilateral     immature  . Chronic insomnia   . Sleep apnea     PT TOLD BORDERLINE-TRIED CPAP-DID NOT HELP-SHE DOES NOT USE CPAP  . Seizures ([place])     febrile seizures as a child  . Vitamin B12 deficiency 02/28/2015    Start B12 shots 02/2015   . Pancreatitis, acute 11/16/2014   Past Surgical History  Procedure Laterality Date  . Foot surgery  left foot    hammer toe  . Hand surgery  Bil    carpal tunnel  . Back surgery  2006, 2007  2006 SPACER, 2007 decompression and fusion L4/5  . Tonsillectomy    . Colon resection  08/25/2011    Procedure: COLON RESECTION LAPAROSCOPIC;  Surgeon: Stark Klein, MD;  Location: [facility];  Service: General;  Laterality: N/A;  Laparoscopic Assisted Low Anterior Resection Diverting Ostomy and onQ pain pump  . Ileostomy  08/25/2011  . Ileostomy closure  04/02/2012    Procedure: ILEOSTOMY TAKEDOWN;  Surgeon: Stark Klein, MD;  Location: [facility];  Service: General;  Laterality: N/A;  . Bowel resection  04/02/2012    Procedure: SMALL BOWEL RESECTION;  Surgeon: Stark Klein, MD;  Location: [facility];  Service: General;  Laterality: N/A;  . Carpal tunnel release Bilateral   . Tee without cardioversion N/A 11/29/2013    Procedure: TRANSESOPHAGEAL ECHOCARDIOGRAM (TEE);  Surgeon: Sueanne Margarita, MD;  Location: [facility];   Service: Cardiovascular;  Laterality: N/A;  . Aortic valve replacement N/A 12/12/2013    Procedure: AORTIC VALVE REPLACEMENT (AVR);  Surgeon: Gaye Pollack, MD; Service: Open Heart Surgery  . Mitral valve replacement N/A 12/12/2013    Procedure: MITRAL VALVE (MV) REPLACEMENT OR REPAIR;  Surgeon: Gaye Pollack, MD; Service: Open Heart Surgery  . Intraoperative transesophageal echocardiogram N/A 12/12/2013    Procedure: INTRAOPERATIVE TRANSESOPHAGEAL ECHOCARDIOGRAM;  Surgeon: Gaye Pollack, MD;  Location: [facility];  Service: Open Heart Surgery;  Laterality: N/A;  . Colonoscopy  03/2013    1 polyp, rpt 3 yrs Ardis Hughs)  . Left and right heart catheterization with coronary angiogram N/A 11/27/2013    Procedure: LEFT AND RIGHT HEART CATHETERIZATION WITH CORONARY ANGIOGRAM;  Surgeon: Sanda Klein, MD;  Location: [facility];  Service: Cardiovascular;  Laterality: N/A;  . Cholecystectomy N/A 01/21/2015    chronic cholecystitis, Stark Klein, MD   Family History  Problem Relation Age of Onset  . Cancer Father     lung  . Heart disease Mother   . Diabetes Mother   . Hypertension Mother   . Colon cancer Neg Hx   . Esophageal cancer Neg Hx   . Stomach cancer Neg Hx   . Rectal cancer Neg Hx   . CAD Son 6    MI - deceased  . Stroke Mother   . Heart attack Maternal Grandfather    Social History  Substance Use Topics  . Smoking status: Former Smoker -- 1.50 packs/day for 10 years    Types: Cigarettes    Quit date: 12/28/1974  . Smokeless tobacco: Never Used     Comment: quit smoking in 1976  . Alcohol Use: 0.0 oz/week    0 Standard drinks or equivalent per week     Comment: rarely wine   OB History    No data available     Review of Systems  Constitutional: Negative for fever.  Respiratory: Positive for cough and shortness of breath. Negative for hemoptysis.   Cardiovascular: Negative for chest pain.  Gastrointestinal: Negative for vomiting and abdominal pain.  All other systems reviewed  and are negative.     Allergies  Codeine; Prednisone; and Sulfonamide derivatives  Home Medications   Prior to Admission medications   Medication Sig Start Date End Date Taking? Authorizing Provider  albuterol (PROVENTIL HFA;VENTOLIN HFA) 108 (90 Base) MCG/ACT inhaler Inhale 2 puffs into the lungs every 6 (six) hours as needed for wheezing. 01/18/16   Brand Males, MD  amoxicillin (AMOXIL) 500 MG capsule take 4 capsules by mouth 30 TO 60 MINUTES PRIOR TO DENTAL APPOINTMENT. 09/17/15   Sanda Klein, MD  aspirin 81 MG tablet Take 81 mg by mouth at bedtime.     Historical Provider, MD  BIOTIN PO Take 1 tablet by mouth at bedtime.     Historical Provider, MD  bisoprolol (ZEBETA) 5 MG tablet Take 0.5 tablets (2.5 mg total) by mouth daily. 10/02/15   Mihai Croitoru, MD  Calcium Carbonate-Vitamin D (CALCIUM 600 + D PO) Take 600 mg by mouth at bedtime.     Historical Provider, MD  Cholecalciferol (VITAMIN D) 2000 UNITS CAPS Take 1 capsule by mouth at bedtime.     Historical Provider, MD  ciprofloxacin (CIPRO) 250 MG tablet Take 1 tablet (250 mg total) by mouth 2 (two) times daily. 12/22/15   Ria Bush, MD  Fluticasone Furoate-Vilanterol (BREO ELLIPTA) 200-25 MCG/INH AEPB Inhale 1 puff into the lungs daily. 03/16/15   Ria Bush, MD  furosemide (LASIX) 40 MG tablet Take 1 tablet (40 mg total) by mouth daily. 03/13/15   Mihai Croitoru, MD  ipratropium-albuterol (DUONEB) 0.5-2.5 (3) MG/3ML SOLN Take 3 mLs by nebulization every 6 (six) hours as needed. 11/14/14   Hendricks Limes, MD  lovastatin (MEVACOR) 40 MG tablet take 1 tablet at bedtime 10/13/15   Ria Bush, MD  methylPREDNISolone (MEDROL DOSEPAK) 4 MG TBPK tablet Use as directed 12/22/15   Ria Bush, MD  Miconazole Nitrate 2 % POWD 1 application by Does not apply route daily as needed (for skin). Apply to stomach and under breasts    Historical Provider, MD  montelukast (SINGULAIR) 10 MG tablet take 1 tablet by mouth at  bedtime 10/13/15   Brand Males, MD  Multiple Vitamins-Minerals (PRESERVISION AREDS 2 PO) Take 1 capsule by mouth daily.    Historical Provider, MD  pantoprazole (PROTONIX) 40 MG tablet Take 1 tablet (40 mg total) by mouth at bedtime. 03/13/15   Mihai Croitoru, MD  polyethylene glycol (MIRALAX / GLYCOLAX) packet Take 17 g by mouth daily. Patient taking differently: Take 17 g by mouth daily as needed.  12/02/13   Brett Canales, PA-C  potassium chloride (K-DUR,KLOR-CON) 10 MEQ tablet Take 2 tablets (20 mEq total) by mouth daily. 03/13/15   Mihai Croitoru, MD  sertraline (ZOLOFT) 25 MG tablet Take 1 tablet (25 mg total) by mouth daily. 10/16/15   Ria Bush, MD  solifenacin (VESICARE) 10 MG tablet Take 1 tablet (10 mg total) by mouth daily. 10/28/15   Ria Bush, MD  SPIRIVA HANDIHALER 18 MCG inhalation capsule inhale the contents of one capsule in the handihaler once daily 03/26/15   Brand Males, MD  traMADol (ULTRAM) 50 MG tablet Take 1 tablet (50 mg total) by mouth every 6 (six) hours as needed for moderate pain. 10/28/15   Ria Bush, MD  traZODone (DESYREL) 50 MG tablet Take 1 tablet (50 mg total) by mouth at bedtime. 10/28/15   Ria Bush, MD  triamcinolone cream (KENALOG) 0.1 % Apply 1 application topically 2 (two) times daily. Apply to [place]. 12/22/15   Ria Bush, MD  vitamin B-12 (CYANOCOBALAMIN) 500 MCG tablet Take 500 mcg by mouth daily.    Historical Provider, MD   BP 125/50 mmHg  Pulse 104  Temp(Src) 98.3 F (36.8 C) (Oral)  Resp 24  Wt 221 lb 2 oz (100.302 kg)  SpO2 92% Physical Exam  Constitutional: She is oriented to person, place, and time. She appears well-developed and well-nourished. No distress.  HENT:  Head: Normocephalic.  Eyes: Conjunctivae are normal.  Neck: Neck supple. No tracheal deviation present.  Cardiovascular: Normal rate, regular  rhythm and normal heart sounds.   Pulmonary/Chest: Effort normal. No respiratory distress. She has wheezes  (diffuse coarse inspiratory and expiratory).  Abdominal: Soft. She exhibits no distension. There is no tenderness.  Neurological: She is alert and oriented to person, place, and time.  Skin: Skin is warm and dry.  Psychiatric: She has a normal mood and affect.  Vitals reviewed.   ED Course  Procedures (including critical care time) Labs Review Labs Reviewed  BASIC METABOLIC PANEL - Abnormal; Notable for the following:    Glucose, Bld 126 (*)    Creatinine, Ser 1.11 (*)    GFR calc non Af Amer 49 (*)    GFR calc Af Amer 57 (*)    All other components within normal limits  CBC - Abnormal; Notable for the following:    WBC 13.0 (*)    Hemoglobin 11.8 (*)    Platelets 144 (*)    All other components within normal limits    Imaging Review Dg Chest 2 View  01/31/2016  CLINICAL DATA:  SOB for about a week now that really worsened yesterday. Pt unable to speak in complete sentences due to SOB after standing for exam. Pt was seen at a clinic yesterday and diagnosed with acute bronchitis or resp infection and was prescribed antibiotics. Pt states SOB worsens when she stands, dropping to 80%. Hx asthma, COPD, CHF, GERD, HTN, pneumonia EXAM: CHEST - 2 VIEW COMPARISON:  11/16/2014 FINDINGS: Previous median sternotomy, AVR and MVR. Heart size upper limits normal. Small right pleural effusion. Progressive patchy airspace opacities and the posterior and lateral basilar segments of the right lower lobe. Coarse linear scarring or subsegmental atelectasis in the lingula is stable. No pneumothorax. Spurring in the mid thoracic spine. Cholecystectomy clips. IMPRESSION: 1. New basilar airspace disease in the right lower lobe with small effusion. Electronically Signed   By: Lucrezia Europe [name]   On: 01/31/2016 14:39   I have personally reviewed and evaluated these images and lab results as part of my medical decision-making.   EKG Interpretation   Date/Time:  Sunday January 31 2016 13:42:01 EDT Ventricular  Rate:  103 PR Interval:  176 QRS Duration: 166 QT Interval:  412 QTC Calculation: 539 R Axis:   176 Text Interpretation:  Sinus tachycardia Right bundle branch block Left  posterior fascicular block **Bifascicular block** Cannot rule out  Inferior infarct , age undetermined Abnormal ECG agree. no acute change  from old. Confirmed by Johnney Killian, MD, Jeannie Done [phone]) on 01/31/2016 1:50:21 PM      MDM   Final diagnoses:  COPD exacerbation ([place])  CAP (community acquired pneumonia)    70 y.o. female presents with cough, started on augmentin 2 days ago by PCP for clinical pneumonia, appears to have CAP on CXR c/w this. Started taking methylprednisolone not under direction of physician, unclear if leukocytosis is 2/2 this or acute infection. No fever, able to ambulate and stays above 90% on pulse oximetry. Discussed possibility of observation admission vs discharge with curb65 of 2 and Pt opted for outpatient treatment. Will change to levaquin to optimize ABx, provided IV doses of solu-medrol and ABx here and will plan for dosepack as there is likely a large COPD component. Plan to follow up with PCP as needed and return precautions discussed for worsening or new concerning symptoms.    Leo Grosser, MD 02/01/16 [phone]

## 2016-01-31 NOTE — ED Notes (Signed)
MD at the bedside  

## 2016-01-31 NOTE — ED Notes (Signed)
Pt here for asthma, SOB, cough. sts that when she gets up and moves around her oxygen drops to 80%. No at home o2 use. Started yesterday on Augmentin for respiratory infection.

## 2016-01-31 NOTE — Discharge Instructions (Signed)

## 2016-01-31 NOTE — ED Notes (Signed)
Walked PT from [employer] D to Pod C, O2 states dropped to 90%. PT SOB and had to stop in POD C. Walked PT back to room. PT now back in bed, O2 state returned to 95%. PT still SOB for a short while.

## 2016-02-02 ENCOUNTER — Ambulatory Visit: Payer: Medicare Other

## 2016-02-02 ENCOUNTER — Encounter (HOSPITAL_COMMUNITY): Payer: Self-pay

## 2016-02-04 ENCOUNTER — Encounter (HOSPITAL_COMMUNITY): Payer: Self-pay

## 2016-02-05 LAB — CULTURE, BLOOD (ROUTINE X 2)
Culture: NO GROWTH
Culture: NO GROWTH

## 2016-02-08 ENCOUNTER — Encounter: Payer: Self-pay | Admitting: Family Medicine

## 2016-02-08 ENCOUNTER — Ambulatory Visit (INDEPENDENT_AMBULATORY_CARE_PROVIDER_SITE_OTHER): Payer: Medicare Other | Admitting: Family Medicine

## 2016-02-08 VITALS — BP 130/70 | HR 70 | Temp 98.2°F | Wt 210.5 lb

## 2016-02-08 DIAGNOSIS — J189 Pneumonia, unspecified organism: Secondary | ICD-10-CM

## 2016-02-08 DIAGNOSIS — R062 Wheezing: Secondary | ICD-10-CM | POA: Diagnosis not present

## 2016-02-08 DIAGNOSIS — J441 Chronic obstructive pulmonary disease with (acute) exacerbation: Secondary | ICD-10-CM

## 2016-02-08 DIAGNOSIS — B370 Candidal stomatitis: Secondary | ICD-10-CM | POA: Insufficient documentation

## 2016-02-08 HISTORY — DX: Pneumonia, unspecified organism: J18.9

## 2016-02-08 MED ORDER — ALBUTEROL SULFATE (2.5 MG/3ML) 0.083% IN NEBU
2.5000 mg | INHALATION_SOLUTION | Freq: Once | RESPIRATORY_TRACT | Status: AC
  Administered 2016-02-08: 2.5 mg via RESPIRATORY_TRACT

## 2016-02-08 MED ORDER — IPRATROPIUM BROMIDE 0.02 % IN SOLN
0.5000 mg | Freq: Once | RESPIRATORY_TRACT | Status: AC
  Administered 2016-02-08: 0.5 mg via RESPIRATORY_TRACT

## 2016-02-08 MED ORDER — NYSTATIN 100000 UNIT/ML MT SUSP: 5.0000 mL | Freq: Three times a day (TID) | OROMUCOSAL | Status: DC

## 2016-02-08 MED ORDER — METHYLPREDNISOLONE 16 MG PO TABS: ORAL_TABLET | ORAL | Status: DC

## 2016-02-08 NOTE — Assessment & Plan Note (Signed)
Treat with nystatin swish/swallow

## 2016-02-08 NOTE — Progress Notes (Signed)
Pre visit review using our clinic review tool, if applicable. No additional management support is needed unless otherwise documented below in the visit note. 

## 2016-02-08 NOTE — Patient Instructions (Signed)
Nystatin swish and swallow for thrush. Extend steroid course sent to pharmacy.  No need for further antibiotics at this time. Schedule albuterol/ipratropium nebulizer three times daily for at least next 3 days. Ok to hold spiriva while regularly taking duoneb (albuterol/ipratropium).  If not improving as expected please return to see [place] again.  Out of rehab for next 1 week. Keep appointment with Dr Lynford Citizen next week. Good to see you today, call us with questions.

## 2016-02-08 NOTE — Progress Notes (Signed)
BP 130/70 mmHg  Pulse 70  Temp(Src) 98.2 F (36.8 C) (Oral)  Wt 210 lb 8 oz (95.482 kg)  SpO2 93% on RA.  CC: ER f/u visit  Subjective:    Patient ID: [name], female    DOB: 11-15-1945, 70 y.o.   MRN: [id number]  HPI: [name] is a 70 y.o. female presenting on 02/08/2016 for Follow-up   Recent eval at [facility] ER 01/31/2016 with 4d h/o worsening dyspnea with cough found to have wheezing. CXR with RLL basilar airspace disease. Treated as CAP with COPD exacerbation. Prior started on augmentin by [facility] - this was converted to levaquin at ER. Treated with IV solumedrol and 2 breathing treatments then with outpatient dose-pack. Found to have mild leukocytosis to 13.0, ?due to steroid de-margination effect. Oxygen prior to ER visit was 80%s.   Blcx no growth x2.  She has been taking mucinex DM twice daily. Mouth staying dry.   Completed prednisone course. Tomorrow is last abx dose. Breathing has improved. Noticing staying winded with mild exertion. No longer hypoxic.   COPD - she is regular with spiriva, breo and albuterol PRN - also on duoneb PRN. Next pulm rehab is tomorrow - wonders if she'll be able to attend.   H/o recurrent pneumonias. Has completed pneumococcal vaccionations. Last pneumovax 06/2013.   Relevant past medical, surgical, family and social history reviewed and updated as indicated. Interim medical history since our last visit reviewed. Allergies and medications reviewed and updated. Current Outpatient Prescriptions on File Prior to Visit  Medication Sig  . albuterol (PROVENTIL HFA;VENTOLIN HFA) 108 (90 Base) MCG/ACT inhaler Inhale 2 puffs into the lungs every 6 (six) hours as needed for wheezing.  Marland Kitchen aspirin 81 MG tablet Take 81 mg by mouth at bedtime.   Marland Kitchen BIOTIN PO Take 1 tablet by mouth at bedtime.   . bisoprolol (ZEBETA) 5 MG tablet Take 0.5 tablets (2.5 mg total) by mouth daily.  . Calcium Carbonate-Vitamin D (CALCIUM 600 + D PO) Take 600 mg  by mouth at bedtime.   . Cholecalciferol (VITAMIN D) 2000 UNITS CAPS Take 2,000 Units by mouth at bedtime.   . Fluticasone Furoate-Vilanterol (BREO ELLIPTA) 200-25 MCG/INH AEPB Inhale 1 puff into the lungs daily.  . furosemide (LASIX) 40 MG tablet Take 1 tablet (40 mg total) by mouth daily.  Marland Kitchen ipratropium-albuterol (DUONEB) 0.5-2.5 (3) MG/3ML SOLN Take 3 mLs by nebulization every 6 (six) hours as needed. (Patient taking differently: Take 3 mLs by nebulization every 6 (six) hours as needed (for wheezing or shortness of breath). )  . levofloxacin (LEVAQUIN) 500 MG tablet Take 1 tablet (500 mg total) by mouth daily.  Marland Kitchen lovastatin (MEVACOR) 40 MG tablet take 1 tablet at bedtime  . Miconazole Nitrate 2 % POWD Apply 1 application topically daily as needed (for skin). Apply to stomach and under breasts  . montelukast (SINGULAIR) 10 MG tablet take 1 tablet by mouth at bedtime  . Multiple Vitamins-Minerals (PRESERVISION AREDS 2 PO) Take 1 capsule by mouth daily.  . pantoprazole (PROTONIX) 40 MG tablet Take 1 tablet (40 mg total) by mouth at bedtime.  . polycarbophil (FIBERCON) 625 MG tablet Take 625 mg by mouth daily.  . polyethylene glycol (MIRALAX / GLYCOLAX) packet Take 17 g by mouth daily. (Patient taking differently: Take 17 g by mouth daily as needed. )  . potassium chloride (K-DUR) 10 MEQ tablet Take 20 mEq by mouth daily.  . solifenacin (VESICARE) 10 MG tablet Take  1 tablet (10 mg total) by mouth daily.  Marland Kitchen SPIRIVA HANDIHALER 18 MCG inhalation capsule inhale the contents of one capsule in the handihaler once daily  . traMADol (ULTRAM) 50 MG tablet Take 1 tablet (50 mg total) by mouth every 6 (six) hours as needed for moderate pain.  . traZODone (DESYREL) 50 MG tablet Take 1 tablet (50 mg total) by mouth at bedtime.  . vitamin B-12 (CYANOCOBALAMIN) 500 MCG tablet Take 500 mcg by mouth daily.  Marland Kitchen amoxicillin (AMOXIL) 500 MG capsule take 4 capsules by mouth 30 TO 60 MINUTES PRIOR TO DENTAL APPOINTMENT.  (Patient not taking: Reported on 02/08/2016)  . sertraline (ZOLOFT) 25 MG tablet Take 1 tablet (25 mg total) by mouth daily. (Patient not taking: Reported on 02/08/2016)   No current facility-administered medications on file prior to visit.    Review of Systems Per HPI unless specifically indicated in ROS section     Objective:    BP 130/70 mmHg  Pulse 70  Temp(Src) 98.2 F (36.8 C) (Oral)  Wt 210 lb 8 oz (95.482 kg)  SpO2 93%  Wt Readings from Last 3 Encounters:  02/08/16 210 lb 8 oz (95.482 kg)  01/31/16 221 lb 2 oz (100.302 kg)  12/22/15 215 lb 8 oz (97.75 kg)    Physical Exam  Constitutional: She appears well-developed and well-nourished. No distress.  HENT:  Head: Normocephalic and atraumatic.  Mouth/Throat: No oropharyngeal exudate.  White patches on tongue, scrapeable.  Neck: Normal range of motion. Neck supple.  Cardiovascular: Normal rate, regular rhythm, normal heart sounds and intact distal pulses.   No murmur heard. Pulmonary/Chest: Effort normal. No respiratory distress. She has decreased breath sounds. She has wheezes. She has rhonchi. She has no rales.  Diffuse insp/exp wheezing with rhonchi/crackles throughout After alb/atrovent neb - improved air movement, improved but persistent wheezing L lung, uncovering of RLL rales  Musculoskeletal: She exhibits no edema.  Lymphadenopathy:    She has no cervical adenopathy.  Skin: Skin is warm and dry. No rash noted.  Psychiatric: She has a normal mood and affect.  Nursing note and vitals reviewed.  Results for orders placed or performed during the hospital encounter of 01/31/16  Blood culture (routine x 2)  Result Value Ref Range   Specimen Description BLOOD RIGHT FOREARM    Special Requests IN PEDIATRIC BOTTLE 1CC    Culture NO GROWTH 5 DAYS    Report Status 02/05/2016 FINAL   Blood culture (routine x 2)  Result Value Ref Range   Specimen Description BLOOD LEFT HAND    Special Requests BOTTLES DRAWN AEROBIC AND  ANAEROBIC 5CC    Culture NO GROWTH 5 DAYS    Report Status 02/05/2016 FINAL   Basic metabolic panel  Result Value Ref Range   Sodium 141 135 - 145 mmol/L   Potassium 3.8 3.5 - 5.1 mmol/L   Chloride 105 101 - 111 mmol/L   CO2 22 22 - 32 mmol/L   Glucose, Bld 126 (H) 65 - 99 mg/dL   BUN 14 6 - 20 mg/dL   Creatinine, Ser 1.11 (H) 0.44 - 1.00 mg/dL   Calcium 9.2 8.9 - 10.3 mg/dL   GFR calc non Af Amer 49 (L) >60 mL/min   GFR calc Af Amer 57 (L) >60 mL/min   Anion gap 14 5 - 15  CBC  Result Value Ref Range   WBC 13.0 (H) 4.0 - 10.5 K/uL   RBC 4.17 3.87 - 5.11 MIL/uL   Hemoglobin 11.8 (L)  12.0 - 15.0 g/dL   HCT 38.3 36.0 - 46.0 %   MCV 91.8 78.0 - 100.0 fL   MCH 28.3 26.0 - 34.0 pg   MCHC 30.8 30.0 - 36.0 g/dL   RDW 15.5 11.5 - 15.5 %   Platelets 144 (L) 150 - 400 K/uL   *Note: Due to a large number of results and/or encounters for the requested time period, some results have not been displayed. A complete set of results can be found in Results Review.      Assessment & Plan:   Problem List Items Addressed This Visit    COPD exacerbation ([place])    Ongoing - alb/atrovent neb in office today. Placed on another 7d medrol taper. Clinically no need for further abx at this time.  Has f/u with pulm next week.      Relevant Medications   albuterol (PROVENTIL) (2.5 MG/3ML) 0.083% nebulizer solution 2.5 mg (Completed)   ipratropium (ATROVENT) nebulizer solution 0.5 mg (Completed)   methylPREDNISolone (MEDROL) 16 MG tablet   CAP (community acquired pneumonia) - Primary    Ongoing recovery but improving each day.  Completing levaquin course - tomorrow last day.  Consider rpt CXR 3-4 wks to document clearing.  No further abx need at this time.       Relevant Medications   albuterol (PROVENTIL) (2.5 MG/3ML) 0.083% nebulizer solution 2.5 mg (Completed)   ipratropium (ATROVENT) nebulizer solution 0.5 mg (Completed)   Oral thrush    Treat with nystatin swish/swallow      Relevant  Medications   nystatin (MYCOSTATIN) 100000 UNIT/ML suspension       Follow up plan: Return if symptoms worsen or fail to improve.  Ria Bush, MD

## 2016-02-08 NOTE — Assessment & Plan Note (Addendum)
Ongoing - alb/atrovent neb in office today. Placed on another 7d medrol taper. Clinically no need for further abx at this time.  Has f/u with pulm next week.

## 2016-02-08 NOTE — Assessment & Plan Note (Signed)
Ongoing recovery but improving each day.  Completing levaquin course - tomorrow last day.  Consider rpt CXR 3-4 wks to document clearing.  No further abx need at this time.

## 2016-02-09 ENCOUNTER — Encounter (HOSPITAL_COMMUNITY): Payer: Self-pay

## 2016-02-09 DIAGNOSIS — R0600 Dyspnea, unspecified: Secondary | ICD-10-CM | POA: Insufficient documentation

## 2016-02-09 DIAGNOSIS — Z954 Presence of other heart-valve replacement: Secondary | ICD-10-CM | POA: Insufficient documentation

## 2016-02-10 ENCOUNTER — Ambulatory Visit
Admission: RE | Admit: 2016-02-10 | Discharge: 2016-02-10 | Disposition: A | Discharge status: Home / Self Care | Payer: Medicare Other | Source: Ambulatory Visit

## 2016-02-10 DIAGNOSIS — Z1231 Encounter for screening mammogram for malignant neoplasm of breast: Secondary | ICD-10-CM

## 2016-02-10 LAB — HM MAMMOGRAPHY

## 2016-02-11 ENCOUNTER — Encounter: Payer: Self-pay | Admitting: *Deleted

## 2016-02-11 ENCOUNTER — Encounter (HOSPITAL_COMMUNITY): Payer: Self-pay

## 2016-02-16 ENCOUNTER — Encounter (HOSPITAL_COMMUNITY): Payer: Self-pay

## 2016-02-16 ENCOUNTER — Encounter: Payer: Self-pay | Admitting: Internal Medicine

## 2016-02-16 ENCOUNTER — Ambulatory Visit (INDEPENDENT_AMBULATORY_CARE_PROVIDER_SITE_OTHER): Payer: Medicare Other | Admitting: Internal Medicine

## 2016-02-16 VITALS — BP 136/76 | HR 57 | Ht 66.0 in | Wt 215.0 lb

## 2016-02-16 DIAGNOSIS — Z8701 Personal history of pneumonia (recurrent): Secondary | ICD-10-CM

## 2016-02-16 DIAGNOSIS — J449 Chronic obstructive pulmonary disease, unspecified: Secondary | ICD-10-CM | POA: Diagnosis not present

## 2016-02-16 HISTORY — DX: Personal history of pneumonia (recurrent): Z87.01

## 2016-02-16 NOTE — Patient Instructions (Signed)
ICD-9-CM ICD-10-CM   1. Chronic obstructive pulmonary disease, unspecified COPD, unspecified chronic bronchitis type 496 J44.9   2. History of pneumonia V12.61 Z87.01     Glad you You're better from recent COPD exacerbation and pneumonia Continue your inhalers as before You can rejoin pulmonary rehabilitation We will consider you for COPD G SK trial looking at prevention of COPD flareups  Follow up - Do chest x-ray 2 view first week of June 2017 as follow-up from pneumonia - Return to see me or one of my nurse practitioner's after your chest x-ray

## 2016-02-16 NOTE — Progress Notes (Signed)
Subjective:     Patient ID: [name], female   DOB: November 01, 1945, 70 y.o.   MRN: [id number]  HPIll   OV 02/16/2016  Chief Complaint  Patient presents with  . Follow-up    Pt states she had pna 2 weeks ago and was given abx and pred by PCP. Pt states she just completed the pred. Pt states her SOB is not back to baseline yet. Pt c/o non prod cough with chest congestion. Pt denies CP/tightness.     Follow-up severe COPD  Last seen 11/16/2015. On 01/31/2016 she went to the emergency department for confirmed hypoxemia at home. Diagnosed base of chest x-ray and symptoms with community by pneumonia and COPD exacerbation. I review the chart visualized the x-ray and do support the findings of the ER doctor. Discharged on antibiotics and prednisone. Her curb 65 score was 2 and she opted for patient. She then followed up with primary care physician 02/08/2016 and due to persistent wheezing was given a diagnosis of COPD exacerbation and prednisone taper was extended. Currently she is feeling better but deconditioned. She is getting close to baseline she wants to go back to pulmonary rehabilitation. There are no other new issues. She is asking for handicapped sticker for her car    has a past medical history of IBS (irritable bowel syndrome); Asthma; Lung nodule; Anemia; Hyperlipemia; Obesity; Arthritis; History of radiation therapy (05/30/11 to 07/07/11); History of pneumonia; History of shingles; Severe aortic valve stenosis  AVR 3/15 (10/01/2013); Rheumatic heart disease mitral stenosis; Anxiety; S/P AVR (aortic valve replacement) (2015); S/P MVR (mitral valve replacement) (2015); DDD (degenerative disc disease); Chronic diastolic heart failure ([place]) (09/19/2014); Small bowel obstruction, partial ([place]) (08/2013); Pancreatitis (11/2014); Osteopenia (12/2014); GERD (gastroesophageal reflux disease); Constipation; COPD (chronic obstructive pulmonary disease) ([place]); PONV (postoperative nausea and vomiting);  Essential hypertension; DOE (dyspnea on exertion); Pneumonia (10/2013); Chronic back pain; Rectal cancer ([place]) (04/2011); History of blood transfusion; Cataracts, bilateral; Chronic insomnia; Sleep apnea; Seizures ([place]); Vitamin B12 deficiency (02/28/2015); Pancreatitis, acute (11/16/2014); and Acute cystitis without hematuria (12/22/2015).   reports that she quit smoking about 41 years ago. Her smoking use included Cigarettes. She has a 15 pack-year smoking history. She has never used smokeless tobacco.  Past Surgical History  Procedure Laterality Date  . Foot surgery  left foot    hammer toe  . Hand surgery  Bil    carpal tunnel  . Back surgery  2006, 2007    2006 SPACER, 2007 decompression and fusion L4/5  . Tonsillectomy    . Colon resection  08/25/2011    Procedure: COLON RESECTION LAPAROSCOPIC;  Surgeon: Stark Klein, MD;  Location: [facility];  Service: General;  Laterality: N/A;  Laparoscopic Assisted Low Anterior Resection Diverting Ostomy and onQ pain pump  . Ileostomy  08/25/2011  . Ileostomy closure  04/02/2012    Procedure: ILEOSTOMY TAKEDOWN;  Surgeon: Stark Klein, MD;  Location: [facility];  Service: General;  Laterality: N/A;  . Bowel resection  04/02/2012    Procedure: SMALL BOWEL RESECTION;  Surgeon: Stark Klein, MD;  Location: [facility];  Service: General;  Laterality: N/A;  . Carpal tunnel release Bilateral   . Tee without cardioversion N/A 11/29/2013    Procedure: TRANSESOPHAGEAL ECHOCARDIOGRAM (TEE);  Surgeon: Sueanne Margarita, MD;  Location: [facility];  Service: Cardiovascular;  Laterality: N/A;  . Aortic valve replacement N/A 12/12/2013    Procedure: AORTIC VALVE REPLACEMENT (AVR);  Surgeon: Gaye Pollack, MD; Service: Open Heart Surgery  . Mitral valve  replacement N/A 12/12/2013    Procedure: MITRAL VALVE (MV) REPLACEMENT OR REPAIR;  Surgeon: Gaye Pollack, MD; Service: Open Heart Surgery  . Intraoperative transesophageal echocardiogram N/A 12/12/2013    Procedure: INTRAOPERATIVE  TRANSESOPHAGEAL ECHOCARDIOGRAM;  Surgeon: Gaye Pollack, MD;  Location: [facility] OR;  Service: Open Heart Surgery;  Laterality: N/A;  . Colonoscopy  03/2013    1 polyp, rpt 3 yrs Ardis Hughs)  . Left and right heart catheterization with coronary angiogram N/A 11/27/2013    Procedure: LEFT AND RIGHT HEART CATHETERIZATION WITH CORONARY ANGIOGRAM;  Surgeon: Sanda Klein, MD;  Location: [facility];  Service: Cardiovascular;  Laterality: N/A;  . Cholecystectomy N/A 01/21/2015    chronic cholecystitis, Stark Klein, MD    Allergies  Allergen Reactions  . Codeine Nausea Only  . Prednisone Hives    In different places all over the body.  . Sulfonamide Derivatives Nausea Only    Immunization History  Administered Date(s) Administered  . H1N1 09/17/2008  . Influenza Split 07/18/2012  . Influenza Whole 08/10/2005, 06/24/2009, 07/11/2011  . Influenza-Unspecified 07/24/2013, 06/30/2014, 06/30/2015  . Pneumococcal Conjugate-13 06/02/2014  . Pneumococcal Polysaccharide-23 08/10/2005, 04/21/2008, 06/25/2013  . Td 10/11/2005  . Zoster 09/16/2014    Family History  Problem Relation Age of Onset  . Cancer Father     lung  . Heart disease Mother   . Diabetes Mother   . Hypertension Mother   . Colon cancer Neg Hx   . Esophageal cancer Neg Hx   . Stomach cancer Neg Hx   . Rectal cancer Neg Hx   . CAD Son 59    MI - deceased  . Stroke Mother   . Heart attack Maternal Grandfather      Current outpatient prescriptions:  .  albuterol (PROVENTIL HFA;VENTOLIN HFA) 108 (90 Base) MCG/ACT inhaler, Inhale 2 puffs into the lungs every 6 (six) hours as needed for wheezing., Disp: 1 Inhaler, Rfl: 2 .  aspirin 81 MG tablet, Take 81 mg by mouth at bedtime. , Disp: , Rfl:  .  BIOTIN PO, Take 1 tablet by mouth at bedtime. , Disp: , Rfl:  .  bisoprolol (ZEBETA) 5 MG tablet, Take 0.5 tablets (2.5 mg total) by mouth daily., Disp: 15 tablet, Rfl: 6 .  Calcium Carbonate-Vitamin D (CALCIUM 600 + D PO), Take 600 mg by  mouth at bedtime. , Disp: , Rfl:  .  Cholecalciferol (VITAMIN D) 2000 UNITS CAPS, Take 2,000 Units by mouth at bedtime. , Disp: , Rfl:  .  Fluticasone Furoate-Vilanterol (BREO ELLIPTA) 200-25 MCG/INH AEPB, Inhale 1 puff into the lungs daily., Disp: 1 each, Rfl: 6 .  furosemide (LASIX) 40 MG tablet, Take 1 tablet (40 mg total) by mouth daily., Disp: 30 tablet, Rfl: 11 .  ipratropium-albuterol (DUONEB) 0.5-2.5 (3) MG/3ML SOLN, Take 3 mLs by nebulization every 6 (six) hours as needed. (Patient taking differently: Take 3 mLs by nebulization every 6 (six) hours as needed (for wheezing or shortness of breath). ), Disp: 360 mL, Rfl: 0 .  lovastatin (MEVACOR) 40 MG tablet, take 1 tablet at bedtime, Disp: 30 tablet, Rfl: 6 .  Miconazole Nitrate 2 % POWD, Apply 1 application topically daily as needed (for skin). Apply to stomach and under breasts, Disp: , Rfl:  .  montelukast (SINGULAIR) 10 MG tablet, take 1 tablet by mouth at bedtime, Disp: 30 tablet, Rfl: 5 .  Multiple Vitamins-Minerals (PRESERVISION AREDS 2 PO), Take 1 capsule by mouth daily., Disp: , Rfl:  .  nystatin (MYCOSTATIN)  100000 UNIT/ML suspension, Take 5 mLs (500,000 Units total) by mouth 3 (three) times daily., Disp: 120 mL, Rfl: 0 .  pantoprazole (PROTONIX) 40 MG tablet, Take 1 tablet (40 mg total) by mouth at bedtime., Disp: 30 tablet, Rfl: 11 .  polycarbophil (FIBERCON) 625 MG tablet, Take 625 mg by mouth daily., Disp: , Rfl:  .  polyethylene glycol (MIRALAX / GLYCOLAX) packet, Take 17 g by mouth daily. (Patient taking differently: Take 17 g by mouth daily as needed. ), Disp: 14 each, Rfl: 0 .  potassium chloride (K-DUR) 10 MEQ tablet, Take 20 mEq by mouth daily., Disp: , Rfl: 1 .  solifenacin (VESICARE) 10 MG tablet, Take 1 tablet (10 mg total) by mouth daily., Disp: 30 tablet, Rfl: 11 .  SPIRIVA HANDIHALER 18 MCG inhalation capsule, inhale the contents of one capsule in the handihaler once daily, Disp: 30 capsule, Rfl: 4 .  traMADol  (ULTRAM) 50 MG tablet, Take 1 tablet (50 mg total) by mouth every 6 (six) hours as needed for moderate pain., Disp: 30 tablet, Rfl: 0 .  traZODone (DESYREL) 50 MG tablet, Take 1 tablet (50 mg total) by mouth at bedtime., Disp: 30 tablet, Rfl: 11 .  vitamin B-12 (CYANOCOBALAMIN) 500 MCG tablet, Take 500 mcg by mouth daily., Disp: , Rfl:  .  amoxicillin (AMOXIL) 500 MG capsule, take 4 capsules by mouth 30 TO 60 MINUTES PRIOR TO DENTAL APPOINTMENT. (Patient not taking: Reported on 02/08/2016), Disp: 4 capsule, Rfl: 12      Review of Systems     Objective:   Physical Exam  Constitutional: She is oriented to person, place, and time. She appears well-developed and well-nourished. No distress.  Obesity A little bit more deconditioned than usual  HENT:  Head: Normocephalic and atraumatic.  Right Ear: External ear normal.  Left Ear: External ear normal.  Mouth/Throat: Oropharynx is clear and moist. No oropharyngeal exudate.  Eyes: Conjunctivae and EOM are normal. Pupils are equal, round, and reactive to light. Right eye exhibits no discharge. Left eye exhibits no discharge. No scleral icterus.  Neck: Normal range of motion. Neck supple. No JVD present. No tracheal deviation present. No thyromegaly present.  Cardiovascular: Normal rate, regular rhythm and intact distal pulses.  Exam reveals no gallop and no friction rub.   Murmur heard. Pulmonary/Chest: Effort normal and breath sounds normal. No respiratory distress. She has no wheezes. She has no rales. She exhibits no tenderness.  Coarse breath sounds compared to baseline  Abdominal: Soft. Bowel sounds are normal. She exhibits no distension and no mass. There is no tenderness. There is no rebound and no guarding.  Musculoskeletal: Normal range of motion. She exhibits no edema or tenderness.  Lymphadenopathy:    She has no cervical adenopathy.  Neurological: She is alert and oriented to person, place, and time. She has normal reflexes. No  cranial nerve deficit. She exhibits normal muscle tone. Coordination normal.  Skin: Skin is warm and dry. No rash noted. She is not diaphoretic. No erythema. No pallor.  Psychiatric: She has a normal mood and affect. Her behavior is normal. Judgment and thought content normal.  Vitals reviewed.   Filed Vitals:   02/16/16 1439  BP: 136/76  Pulse: 57  Height: 5\' 6"  (1.676 m)  Weight: 215 lb (97.523 kg)  SpO2: 97%   On Room air     Assessment:       ICD-9-CM ICD-10-CM   1. Chronic obstructive pulmonary disease, unspecified COPD, unspecified chronic bronchitis type 50 J44.9  2. History of pneumonia V12.61 Z87.01        Plan:      Glad you You're better from recent COPD exacerbation and pneumonia Continue your inhalers as before You can rejoin pulmonary rehabilitation We will consider you for COPD G SK trial looking at prevention of COPD flareups  Follow up - Do chest x-ray 2 view first week of June 2017 as follow-up from pneumonia - Return to see me or one of my nurse practitioner's after your chest x-ray    Dr. Brand Males, [name], [facility].C.P Pulmonary and Critical Care Medicine Staff Physician [facility] Pager: [phone], If no answer or between  15:00h - 7:00h: call [phone]  02/16/2016 3:28 PM

## 2016-02-18 ENCOUNTER — Encounter (HOSPITAL_COMMUNITY): Payer: Self-pay

## 2016-02-23 ENCOUNTER — Encounter (HOSPITAL_COMMUNITY)
Admission: RE | Admit: 2016-02-23 | Discharge: 2016-02-23 | Disposition: A | Discharge status: Home / Self Care | Payer: Self-pay | Source: Ambulatory Visit | Attending: Internal Medicine | Admitting: Internal Medicine

## 2016-02-24 ENCOUNTER — Telehealth: Payer: Self-pay | Admitting: Family Medicine

## 2016-02-24 NOTE — Telephone Encounter (Signed)
Patient Name: [name]  DOB: [birth year]    Initial Comment She is having swelling in one of her feet.    Nurse Assessment  Nurse: Thad Ranger RN, Denise Date/Time (Eastern Time): 02/24/2016 4:51:29 PM  Confirm and document reason for call. If symptomatic, describe symptoms. You must click the next button to save text entered. ---She is having swelling in one of her feet. No injury  Has the patient traveled out of the country within the last 30 days? ---Not Applicable  Does the patient have any new or worsening symptoms? ---Yes  Will a triage be completed? ---Yes  Related visit to physician within the last 2 weeks? ---No  Does the PT have any chronic conditions? (i.e. diabetes, asthma, etc.) ---Yes  List chronic conditions. ---COPD  Is this a behavioral health or substance abuse call? ---No     Guidelines    Guideline Title Affirmed Question Affirmed Notes  Leg Swelling and Edema [1] MODERATE leg swelling (e.g., swelling extends up to knees) AND [2] new onset or worsening    Final Disposition User   See Physician within 24 Hours Carmon, RN, Langley Gauss    Comments  No triage guideline avail for foot swelling. Erring on side of caution and sending in to be evaluated. Elevation is not helping he edema and it is only the right foot that is swollen. Wants a MD appt to be seen.  Appt med for Thursday, 02/25/2016 @ 1115am. Pt aware/agreeable with POC.   Referrals  REFERRED TO PCP OFFICE  REFERRED TO PCP OFFICE   Disagree/Comply: Comply

## 2016-02-25 ENCOUNTER — Encounter: Payer: Self-pay | Admitting: Family Medicine

## 2016-02-25 ENCOUNTER — Ambulatory Visit (INDEPENDENT_AMBULATORY_CARE_PROVIDER_SITE_OTHER): Payer: Medicare Other | Admitting: Family Medicine

## 2016-02-25 ENCOUNTER — Encounter (HOSPITAL_COMMUNITY): Payer: Self-pay

## 2016-02-25 VITALS — BP 118/60 | HR 80 | Temp 97.8°F | Wt 215.8 lb

## 2016-02-25 DIAGNOSIS — R600 Localized edema: Secondary | ICD-10-CM | POA: Diagnosis not present

## 2016-02-25 NOTE — Progress Notes (Signed)
BP 118/60 mmHg  Pulse 80  Temp(Src) 97.8 F (36.6 C) (Oral)  Wt 215 lb 12 oz (97.864 kg)   CC: R foot swelling  Subjective:    Patient ID: [name], female    DOB: [birth year], 70 y.o.   MRN: [id number]  HPI: [name] is a 70 y.o. female presenting on 02/25/2016 for Foot Swelling   4d h/o isolated R pedal swelling. She did miss fluid pill (lasix) on Sunday (had company over). She doubled up on fluid pill Tuesday - no improvement. No pain, redness, warmth. Worried about blood clot. No calf pain. Not on HRT. No recent prolonged immobility. Continues pulm rehab. No new dyspnea or chest pain. She did have crab spinach and cheese quiche for lunch on Mother's day - tasted salty. No inciting trauma/falls.  Recent ER evalutaion for CAP/COPD exacerbation - consider rpt CXR 03/2106 to document resolution - this is to be scheduled through pulm ([facility]).   Relevant past medical, surgical, family and social history reviewed and updated as indicated. Interim medical history since our last visit reviewed. Allergies and medications reviewed and updated. Current Outpatient Prescriptions on File Prior to Visit  Medication Sig  . albuterol (PROVENTIL HFA;VENTOLIN HFA) 108 (90 Base) MCG/ACT inhaler Inhale 2 puffs into the lungs every 6 (six) hours as needed for wheezing.  Marland Kitchen aspirin 81 MG tablet Take 81 mg by mouth at bedtime.   Marland Kitchen BIOTIN PO Take 1 tablet by mouth at bedtime.   . bisoprolol (ZEBETA) 5 MG tablet Take 0.5 tablets (2.5 mg total) by mouth daily.  . Calcium Carbonate-Vitamin D (CALCIUM 600 + D PO) Take 600 mg by mouth at bedtime.   . Cholecalciferol (VITAMIN D) 2000 UNITS CAPS Take 2,000 Units by mouth at bedtime.   . Fluticasone Furoate-Vilanterol (BREO ELLIPTA) 200-25 MCG/INH AEPB Inhale 1 puff into the lungs daily.  . furosemide (LASIX) 40 MG tablet Take 1 tablet (40 mg total) by mouth daily.  Marland Kitchen ipratropium-albuterol (DUONEB) 0.5-2.5 (3) MG/3ML SOLN Take 3 mLs by  nebulization every 6 (six) hours as needed. (Patient taking differently: Take 3 mLs by nebulization every 6 (six) hours as needed (for wheezing or shortness of breath). )  . lovastatin (MEVACOR) 40 MG tablet take 1 tablet at bedtime  . Miconazole Nitrate 2 % POWD Apply 1 application topically daily as needed (for skin). Apply to stomach and under breasts  . montelukast (SINGULAIR) 10 MG tablet take 1 tablet by mouth at bedtime  . Multiple Vitamins-Minerals (PRESERVISION AREDS 2 PO) Take 1 capsule by mouth daily.  Marland Kitchen nystatin (MYCOSTATIN) 100000 UNIT/ML suspension Take 5 mLs (500,000 Units total) by mouth 3 (three) times daily.  . pantoprazole (PROTONIX) 40 MG tablet Take 1 tablet (40 mg total) by mouth at bedtime.  . polycarbophil (FIBERCON) 625 MG tablet Take 625 mg by mouth daily.  . polyethylene glycol (MIRALAX / GLYCOLAX) packet Take 17 g by mouth daily. (Patient taking differently: Take 17 g by mouth daily as needed. )  . potassium chloride (K-DUR) 10 MEQ tablet Take 20 mEq by mouth daily.  . solifenacin (VESICARE) 10 MG tablet Take 1 tablet (10 mg total) by mouth daily.  Marland Kitchen SPIRIVA HANDIHALER 18 MCG inhalation capsule inhale the contents of one capsule in the handihaler once daily  . traMADol (ULTRAM) 50 MG tablet Take 1 tablet (50 mg total) by mouth every 6 (six) hours as needed for moderate pain.  . traZODone (DESYREL) 50 MG tablet Take 1 tablet (50  mg total) by mouth at bedtime.  . vitamin B-12 (CYANOCOBALAMIN) 500 MCG tablet Take 500 mcg by mouth daily.  Marland Kitchen amoxicillin (AMOXIL) 500 MG capsule take 4 capsules by mouth 30 TO 60 MINUTES PRIOR TO DENTAL APPOINTMENT. (Patient not taking: Reported on 02/08/2016)   No current facility-administered medications on file prior to visit.    Review of Systems Per HPI unless specifically indicated in ROS section     Objective:    BP 118/60 mmHg  Pulse 80  Temp(Src) 97.8 F (36.6 C) (Oral)  Wt 215 lb 12 oz (97.864 kg)  Wt Readings from Last 3  Encounters:  02/25/16 215 lb 12 oz (97.864 kg)  02/16/16 215 lb (97.523 kg)  02/08/16 210 lb 8 oz (95.482 kg)    Physical Exam  Constitutional: She appears well-developed and well-nourished. No distress.  No dyspnea  Musculoskeletal: She exhibits edema.  2+ DP bilaterally Focal R 1+ dorsal pedal edema noted today with tr pedal edema lower leg No palpable cords or calf tenderness Bilateral calf circumference 37cm  Skin: Skin is warm and dry. No rash noted.  Psychiatric: She has a normal mood and affect.  Nursing note and vitals reviewed.     Assessment & Plan:   Problem List Items Addressed This Visit    Pedal edema - Primary    Isolated to right, unclear cause, possibly due to increased Na intake. No signs of DVT or CHF exacerbation. Supportive care discussed. Update if not improving with this. Pt agrees.          Follow up plan: Return if symptoms worsen or fail to improve.  Ria Bush, MD

## 2016-02-25 NOTE — Progress Notes (Signed)
Pre visit review using our clinic review tool, if applicable. No additional management support is needed unless otherwise documented below in the visit note. 

## 2016-02-25 NOTE — Patient Instructions (Addendum)
I'm not sure why isolated right leg swelling. No signs of blood clot today. Keep leg elevated, avoid salt in diet as up to now, increase water intake. Continue lasix 40mg  daily.  Should improve over next few days - if not let us know.

## 2016-02-25 NOTE — Telephone Encounter (Signed)
appt scheduled today at 11:15am with Dr. Danise Mina

## 2016-02-25 NOTE — Assessment & Plan Note (Addendum)
Isolated to right, unclear cause, possibly due to increased Na intake. No signs of DVT or CHF exacerbation. Supportive care discussed. Update if not improving with this. Pt agrees.

## 2016-03-01 ENCOUNTER — Encounter (HOSPITAL_COMMUNITY): Payer: Self-pay

## 2016-03-03 ENCOUNTER — Encounter (HOSPITAL_COMMUNITY): Payer: Self-pay

## 2016-03-08 ENCOUNTER — Encounter (HOSPITAL_COMMUNITY)
Admission: RE | Admit: 2016-03-08 | Discharge: 2016-03-08 | Disposition: A | Discharge status: Home / Self Care | Payer: Self-pay | Source: Ambulatory Visit | Attending: Internal Medicine | Admitting: Internal Medicine

## 2016-03-10 ENCOUNTER — Encounter (HOSPITAL_COMMUNITY)
Admission: RE | Admit: 2016-03-10 | Discharge: 2016-03-10 | Disposition: A | Discharge status: Home / Self Care | Payer: Self-pay | Source: Ambulatory Visit | Attending: Internal Medicine | Admitting: Internal Medicine

## 2016-03-10 DIAGNOSIS — R0600 Dyspnea, unspecified: Secondary | ICD-10-CM | POA: Insufficient documentation

## 2016-03-10 DIAGNOSIS — Z954 Presence of other heart-valve replacement: Secondary | ICD-10-CM | POA: Insufficient documentation

## 2016-03-14 ENCOUNTER — Ambulatory Visit (AMBULATORY_SURGERY_CENTER): Payer: Self-pay | Admitting: *Deleted

## 2016-03-14 VITALS — Ht 66.0 in | Wt 217.0 lb

## 2016-03-14 DIAGNOSIS — Z85048 Personal history of other malignant neoplasm of rectum, rectosigmoid junction, and anus: Secondary | ICD-10-CM

## 2016-03-14 DIAGNOSIS — Z86010 Personal history of colonic polyps: Secondary | ICD-10-CM

## 2016-03-14 MED ORDER — NA SULFATE-K SULFATE-MG SULF 17.5-3.13-1.6 GM/177ML PO SOLN: ORAL | Status: DC

## 2016-03-14 NOTE — Progress Notes (Signed)
Patient denies any allergies to eggs or soy. Patient denies any problems with anesthesia/sedation. Patient denies any oxygen use at home and does not take any diet/weight loss medications. Patient declined EMMI education. Patient denies problems with constipation "since gal bladder removed". Encouraged pt to take Miralax if needed the week of colonoscopy.

## 2016-03-15 ENCOUNTER — Encounter (HOSPITAL_COMMUNITY)
Admission: RE | Admit: 2016-03-15 | Discharge: 2016-03-15 | Disposition: A | Discharge status: Home / Self Care | Payer: Self-pay | Source: Ambulatory Visit | Attending: Internal Medicine | Admitting: Internal Medicine

## 2016-03-17 ENCOUNTER — Encounter: Payer: Self-pay | Admitting: Internal Medicine

## 2016-03-17 ENCOUNTER — Encounter (HOSPITAL_COMMUNITY): Payer: Self-pay

## 2016-03-17 ENCOUNTER — Ambulatory Visit (INDEPENDENT_AMBULATORY_CARE_PROVIDER_SITE_OTHER)
Admission: RE | Admit: 2016-03-17 | Discharge: 2016-03-17 | Disposition: A | Discharge status: Home / Self Care | Payer: Medicare Other | Source: Ambulatory Visit | Attending: Internal Medicine | Admitting: Internal Medicine

## 2016-03-17 ENCOUNTER — Ambulatory Visit (INDEPENDENT_AMBULATORY_CARE_PROVIDER_SITE_OTHER): Payer: Medicare Other | Admitting: Internal Medicine

## 2016-03-17 ENCOUNTER — Telehealth: Payer: Self-pay | Admitting: Internal Medicine

## 2016-03-17 VITALS — BP 114/66 | HR 52 | Ht 66.0 in | Wt 215.8 lb

## 2016-03-17 DIAGNOSIS — Z8701 Personal history of pneumonia (recurrent): Secondary | ICD-10-CM | POA: Diagnosis not present

## 2016-03-17 DIAGNOSIS — J449 Chronic obstructive pulmonary disease, unspecified: Secondary | ICD-10-CM | POA: Diagnosis not present

## 2016-03-17 NOTE — Patient Instructions (Addendum)
ICD-9-CM ICD-10-CM   1. Chronic obstructive pulmonary disease, unspecified COPD type ([place]) 496 J44.9 DG Chest 2 View  2. History of pneumonia V12.61 Z87.01     Stable Pnemonia better on xray - but official report pending Continue scheduled medications as before Watch out for flare up Will touch base with Dr Ardis Hughs about colonoscopy safety with moderate sedation Do full PFT at [place] next 2 weeks will call with results  Followup 3 months scheduled with Dr [name] - consider repeat cxr/ct at [facility]

## 2016-03-17 NOTE — Progress Notes (Signed)
Subjective:     Patient ID: [name], female   DOB: [birth year], 70 y.o.   MRN: [id number]  HPI  OV 02/16/2016  Chief Complaint  Patient presents with  . Follow-up    Pt states she had pna 2 weeks ago and was given abx and pred by PCP. Pt states she just completed the pred. Pt states her SOB is not back to baseline yet. Pt c/o non prod cough with chest congestion. Pt denies CP/tightness.     Follow-up severe COPD  Last seen 11/16/2015. On 01/31/2016 she went to the emergency department for confirmed hypoxemia at home. Diagnosed base of chest x-ray and symptoms with community by pneumonia and COPD exacerbation. I review the chart visualized the x-ray and do support the findings of the ER doctor. Discharged on antibiotics and prednisone. Her curb 65 score was 2 and she opted for patient. She then followed up with primary care physician 02/08/2016 and due to persistent wheezing was given a diagnosis of COPD exacerbation and prednisone taper was extended. Currently she is feeling better but deconditioned. She is getting close to baseline she wants to go back to pulmonary rehabilitation. There are no other new issues. She is asking for handicapped sticker for her car   [place] 03/17/2016  Chief Complaint  Patient presents with  . Follow-up    review CXR.  pt notes stable SOB with exertion.     FU severe copd and recent pneumonia. Feels back to baseline. Compliant with inhalers. Has baseline dyspnea. CXR visiualized and agre with resport. Improved She reports need for colonoscopy under sedation. Chart review shows no dlco in  Years and last spirometry in 2 years. She is interested in copd research trials. Smoking hx - qiuit 1976; 15ppd smoker till then  Dg Chest 2 View  03/17/2016  CLINICAL DATA:  Right pleural effusion, shortness of breath EXAM: CHEST  2 VIEW COMPARISON:  01/31/2016 FINDINGS: Chronic small right pleural effusion. Associated right basilar opacity, likely scarring/atelectasis. No  focal consolidation. No pleural effusion or pneumothorax. Mild cardiomegaly.  Prostatic valves. Degenerative changes of the visualized thoracolumbar spine. Median sternotomy. IMPRESSION: Chronic small right pleural effusion. Associated right basilar opacity, likely scarring/ atelectasis. No evidence of acute cardiopulmonary disease. Electronically Signed   By: Julian Hy [name]   On: 03/17/2016 14:30       has a past medical history of IBS (irritable bowel syndrome); Asthma; Lung nodule; Anemia; Hyperlipemia; Obesity; Arthritis; History of radiation therapy (05/30/11 to 07/07/11); History of pneumonia; History of shingles; Severe aortic valve stenosis  AVR 3/15 (10/01/2013); Rheumatic heart disease mitral stenosis; Anxiety; S/P AVR (aortic valve replacement) (2015); S/P MVR (mitral valve replacement) (2015); DDD (degenerative disc disease); Chronic diastolic heart failure ([place]) (09/19/2014); Small bowel obstruction, partial ([place]) (08/2013); Pancreatitis (11/2014); Osteopenia (12/2014); GERD (gastroesophageal reflux disease); Constipation; COPD (chronic obstructive pulmonary disease) ([place]); PONV (postoperative nausea and vomiting); Essential hypertension; DOE (dyspnea on exertion); Pneumonia (10/2013); Chronic back pain; Rectal cancer ([place]) (04/2011); History of blood transfusion; Cataracts, bilateral; Chronic insomnia; Sleep apnea; Seizures ([place]); Vitamin B12 deficiency (02/28/2015); Pancreatitis, acute (11/16/2014); and Acute cystitis without hematuria (12/22/2015).   reports that she quit smoking about 41 years ago. Her smoking use included Cigarettes. She has a 15 pack-year smoking history. She has never used smokeless tobacco.  Past Surgical History  Procedure Laterality Date  . Foot surgery  left foot    hammer toe  . Hand surgery  Bil    carpal tunnel  . Back surgery  2006, 2007    2006 SPACER, 2007 decompression and fusion L4/5  . Tonsillectomy    . Colon resection  08/25/2011    Procedure:  COLON RESECTION LAPAROSCOPIC;  Surgeon: Stark Klein, MD;  Location: [facility];  Service: General;  Laterality: N/A;  Laparoscopic Assisted Low Anterior Resection Diverting Ostomy and onQ pain pump  . Ileostomy  08/25/2011  . Ileostomy closure  04/02/2012    Procedure: ILEOSTOMY TAKEDOWN;  Surgeon: Stark Klein, MD;  Location: [facility];  Service: General;  Laterality: N/A;  . Bowel resection  04/02/2012    Procedure: SMALL BOWEL RESECTION;  Surgeon: Stark Klein, MD;  Location: [facility];  Service: General;  Laterality: N/A;  . Carpal tunnel release Bilateral   . Tee without cardioversion N/A 11/29/2013    Procedure: TRANSESOPHAGEAL ECHOCARDIOGRAM (TEE);  Surgeon: Sueanne Margarita, MD;  Location: [facility];  Service: Cardiovascular;  Laterality: N/A;  . Aortic valve replacement N/A 12/12/2013    Procedure: AORTIC VALVE REPLACEMENT (AVR);  Surgeon: Gaye Pollack, MD; Service: Open Heart Surgery  . Mitral valve replacement N/A 12/12/2013    Procedure: MITRAL VALVE (MV) REPLACEMENT OR REPAIR;  Surgeon: Gaye Pollack, MD; Service: Open Heart Surgery  . Intraoperative transesophageal echocardiogram N/A 12/12/2013    Procedure: INTRAOPERATIVE TRANSESOPHAGEAL ECHOCARDIOGRAM;  Surgeon: Gaye Pollack, MD;  Location: [facility] OR;  Service: Open Heart Surgery;  Laterality: N/A;  . Colonoscopy  03/2013    1 polyp, rpt 3 yrs Ardis Hughs)  . Left and right heart catheterization with coronary angiogram N/A 11/27/2013    Procedure: LEFT AND RIGHT HEART CATHETERIZATION WITH CORONARY ANGIOGRAM;  Surgeon: Sanda Klein, MD;  Location: [facility];  Service: Cardiovascular;  Laterality: N/A;  . Cholecystectomy N/A 01/21/2015    chronic cholecystitis, Stark Klein, MD    Allergies  Allergen Reactions  . Codeine Nausea Only  . Prednisone Hives    In different places all over the body.  . Sulfonamide Derivatives Nausea Only    Immunization History  Administered Date(s) Administered  . H1N1 09/17/2008  . Influenza Split 07/18/2012   . Influenza Whole 08/10/2005, 06/24/2009, 07/11/2011  . Influenza-Unspecified 07/24/2013, 06/30/2014, 06/30/2015  . Pneumococcal Conjugate-13 06/02/2014  . Pneumococcal Polysaccharide-23 08/10/2005, 04/21/2008, 06/25/2013  . Td 10/11/2005  . Zoster 09/16/2014    Family History  Problem Relation Age of Onset  . Cancer Father     lung  . Heart disease Mother   . Diabetes Mother   . Hypertension Mother   . Colon cancer Neg Hx   . Esophageal cancer Neg Hx   . Stomach cancer Neg Hx   . Rectal cancer Neg Hx   . CAD Son 51    MI - deceased  . Stroke Mother   . Heart attack Maternal Grandfather      Current outpatient prescriptions:  .  albuterol (PROVENTIL HFA;VENTOLIN HFA) 108 (90 Base) MCG/ACT inhaler, Inhale 2 puffs into the lungs every 6 (six) hours as needed for wheezing., Disp: 1 Inhaler, Rfl: 2 .  aspirin 81 MG tablet, Take 81 mg by mouth at bedtime. , Disp: , Rfl:  .  BIOTIN PO, Take 1 tablet by mouth at bedtime. , Disp: , Rfl:  .  bisoprolol (ZEBETA) 5 MG tablet, Take 0.5 tablets (2.5 mg total) by mouth daily., Disp: 15 tablet, Rfl: 6 .  Calcium Carbonate-Vitamin D (CALCIUM 600 + D PO), Take 600 mg by mouth at bedtime. , Disp: , Rfl:  .  Cholecalciferol (VITAMIN D) 2000 UNITS CAPS,  Take 2,000 Units by mouth at bedtime. , Disp: , Rfl:  .  Fluticasone Furoate-Vilanterol (BREO ELLIPTA) 200-25 MCG/INH AEPB, Inhale 1 puff into the lungs daily., Disp: 1 each, Rfl: 6 .  furosemide (LASIX) 40 MG tablet, Take 1 tablet (40 mg total) by mouth daily., Disp: 30 tablet, Rfl: 11 .  ipratropium-albuterol (DUONEB) 0.5-2.5 (3) MG/3ML SOLN, Take 3 mLs by nebulization every 6 (six) hours as needed. (Patient taking differently: Take 3 mLs by nebulization every 6 (six) hours as needed (for wheezing or shortness of breath). ), Disp: 360 mL, Rfl: 0 .  lovastatin (MEVACOR) 40 MG tablet, take 1 tablet at bedtime, Disp: 30 tablet, Rfl: 6 .  Miconazole Nitrate 2 % POWD, Apply 1 application topically  daily as needed (for skin). Apply to stomach and under breasts, Disp: , Rfl:  .  montelukast (SINGULAIR) 10 MG tablet, take 1 tablet by mouth at bedtime, Disp: 30 tablet, Rfl: 5 .  Multiple Vitamins-Minerals (PRESERVISION AREDS 2 PO), Take 1 capsule by mouth daily., Disp: , Rfl:  .  Na Sulfate-K Sulfate-Mg Sulf 17.5-3.13-1.6 GM/180ML SOLN, Suprep (no substitutions)-TAKE AS DIRECTED., Disp: 354 mL, Rfl: 0 .  pantoprazole (PROTONIX) 40 MG tablet, Take 1 tablet (40 mg total) by mouth at bedtime., Disp: 30 tablet, Rfl: 11 .  polycarbophil (FIBERCON) 625 MG tablet, Take 625 mg by mouth daily., Disp: , Rfl:  .  polyethylene glycol (MIRALAX / GLYCOLAX) packet, Take 17 g by mouth daily. (Patient taking differently: Take 17 g by mouth daily as needed. ), Disp: 14 each, Rfl: 0 .  potassium chloride (K-DUR) 10 MEQ tablet, Take 20 mEq by mouth daily., Disp: , Rfl: 1 .  solifenacin (VESICARE) 10 MG tablet, Take 1 tablet (10 mg total) by mouth daily., Disp: 30 tablet, Rfl: 11 .  SPIRIVA HANDIHALER 18 MCG inhalation capsule, inhale the contents of one capsule in the handihaler once daily, Disp: 30 capsule, Rfl: 4 .  traMADol (ULTRAM) 50 MG tablet, Take 1 tablet (50 mg total) by mouth every 6 (six) hours as needed for moderate pain., Disp: 30 tablet, Rfl: 0 .  traZODone (DESYREL) 50 MG tablet, Take 1 tablet (50 mg total) by mouth at bedtime., Disp: 30 tablet, Rfl: 11 .  vitamin B-12 (CYANOCOBALAMIN) 500 MCG tablet, Take 500 mcg by mouth daily., Disp: , Rfl:  .  amoxicillin (AMOXIL) 500 MG capsule, take 4 capsules by mouth 30 TO 60 MINUTES PRIOR TO DENTAL APPOINTMENT. (Patient not taking: Reported on 02/08/2016), Disp: 4 capsule, Rfl: 12     Review of Systems     Objective:   Physical Exam  Constitutional: She is oriented to person, place, and time. She appears well-developed and well-nourished. No distress.  HENT:  Head: Normocephalic and atraumatic.  Right Ear: External ear normal.  Left Ear: External ear  normal.  Mouth/Throat: Oropharynx is clear and moist. No oropharyngeal exudate.  Eyes: Conjunctivae and EOM are normal. Pupils are equal, round, and reactive to light. Right eye exhibits no discharge. Left eye exhibits no discharge. No scleral icterus.  Neck: Normal range of motion. Neck supple. No JVD present. No tracheal deviation present. No thyromegaly present.  Cardiovascular: Normal rate, regular rhythm, normal heart sounds and intact distal pulses.  Exam reveals no gallop and no friction rub.   No murmur heard. Pulmonary/Chest: Effort normal and breath sounds normal. No respiratory distress. She has no wheezes. She has no rales. She exhibits no tenderness.  ? Transmitted upper airway wheeze  Abdominal: Soft. Bowel  sounds are normal. She exhibits no distension and no mass. There is no tenderness. There is no rebound and no guarding.  Musculoskeletal: Normal range of motion. She exhibits no edema or tenderness.  Lymphadenopathy:    She has no cervical adenopathy.  Neurological: She is alert and oriented to person, place, and time. She has normal reflexes. No cranial nerve deficit. She exhibits normal muscle tone. Coordination normal.  Skin: Skin is warm and dry. No rash noted. She is not diaphoretic. No erythema. No pallor.  Psychiatric: She has a normal mood and affect. Her behavior is normal. Judgment and thought content normal.  Vitals reviewed.   Filed Vitals:   03/17/16 1217  BP: 114/66  Pulse: 52  Height: 5\' 6"  (1.676 m)  Weight: 215 lb 12.8 oz (97.886 kg)  SpO2: 95%        Assessment:       ICD-9-CM ICD-10-CM   1. Chronic obstructive pulmonary disease, unspecified COPD type ([place]) 496 J44.9 DG Chest 2 View     Pulmonary function test  2. History of pneumonia V12.61 Z87.01        Plan:      Stable Pnemonia better on xray - but official report pending Continue scheduled medications as before Watch out for flare up Will touch base with Dr Ardis Hughs about colonoscopy  safety with moderate sedation Do full PFT at [facility] next 2 weeks will call with results  Followup 3 months scheduled with Dr [name] - depending on fu might consider CT chest     Dr. Brand Males, [name], [facility].C.P Pulmonary and Critical Care Medicine Staff Physician [place] [facility] Pager: [phone], If no answer or between  15:00h - 7:00h: call [phone]  03/17/2016 5:38 PM

## 2016-03-17 NOTE — Telephone Encounter (Signed)
Hi Dan  I heard you are planning colonoscopy for [name] at 4th floor  Under seation. Her most recent PFT showed severe obstruction. She is getting a repeat in 2 weeks. She is not on o2 and says she has had prior procedures under modrate sedation without a problem but is now s/p heart surgery and she is older and has severe obstn. So, maybe better off with anesthesia monitoring at endoscopy suite at [facility]  Thanks  Dr. Brand Males, [name], [facility].C.P Pulmonary and Critical Care Medicine Staff Physician [place] [facility] Pager: [phone], If no answer or between  15:00h - 7:00h: call [phone]  03/17/2016 5:35 PM

## 2016-03-18 NOTE — Telephone Encounter (Signed)
[name], Thanks for the information, it is very helpful.  [name], Can you get in touch with her. Cancel her upcoming colonoscopy. She's had lot of serious health issues lately and I prefer to see her in the office (ROV next available) to discuss polyp surveillance procedures in this new context.  THanks

## 2016-03-18 NOTE — Telephone Encounter (Signed)
I left a detailed message that I need to cancel her procedure and asked that she call me back to discuss scheduling an office visit with Dr. Ardis Hughs.

## 2016-03-18 NOTE — Telephone Encounter (Signed)
Thanks. She might be fine for mod sedation if PFTs in few weeks is fine but great idea to see her and then decide

## 2016-03-22 ENCOUNTER — Encounter (HOSPITAL_COMMUNITY)
Admission: RE | Admit: 2016-03-22 | Discharge: 2016-03-22 | Disposition: A | Discharge status: Home / Self Care | Payer: Self-pay | Source: Ambulatory Visit | Attending: Internal Medicine | Admitting: Internal Medicine

## 2016-03-24 ENCOUNTER — Encounter (HOSPITAL_COMMUNITY)
Admission: RE | Admit: 2016-03-24 | Discharge: 2016-03-24 | Disposition: A | Discharge status: Home / Self Care | Payer: Self-pay | Source: Ambulatory Visit | Attending: Internal Medicine | Admitting: Internal Medicine

## 2016-03-28 ENCOUNTER — Encounter: Payer: Medicare Other | Admitting: Gastroenterology

## 2016-03-29 ENCOUNTER — Encounter (HOSPITAL_COMMUNITY)
Admission: RE | Admit: 2016-03-29 | Discharge: 2016-03-29 | Disposition: A | Discharge status: Home / Self Care | Payer: Self-pay | Source: Ambulatory Visit | Attending: Internal Medicine | Admitting: Internal Medicine

## 2016-03-31 ENCOUNTER — Encounter (HOSPITAL_COMMUNITY): Payer: Self-pay

## 2016-04-01 ENCOUNTER — Ambulatory Visit (HOSPITAL_COMMUNITY)
Admission: RE | Admit: 2016-04-01 | Discharge: 2016-04-01 | Disposition: A | Discharge status: Home / Self Care | Payer: Medicare Other | Source: Ambulatory Visit | Attending: Internal Medicine | Admitting: Internal Medicine

## 2016-04-01 DIAGNOSIS — J449 Chronic obstructive pulmonary disease, unspecified: Secondary | ICD-10-CM | POA: Diagnosis present

## 2016-04-01 LAB — PULMONARY FUNCTION TEST
DL/VA % pred: 95 %
DL/VA: 4.84 ml/min/mmHg/L
DLCO unc % pred: 71 %
DLCO unc: 19.21 ml/min/mmHg
FEF 25-75 Post: 0.83 L/sec
FEF 25-75 Pre: 0.62 L/sec
FEF2575-%Change-Post: 34 %
FEF2575-%Pred-Post: 41 %
FEF2575-%Pred-Pre: 31 %
FEV1-%Change-Post: 9 %
FEV1-%Pred-Post: 60 %
FEV1-%Pred-Pre: 55 %
FEV1-Post: 1.47 L
FEV1-Pre: 1.35 L
FEV1FVC-%Change-Post: 4 %
FEV1FVC-%Pred-Pre: 79 %
FEV6-%Change-Post: 6 %
FEV6-%Pred-Post: 75 %
FEV6-%Pred-Pre: 70 %
FEV6-Post: 2.31 L
FEV6-Pre: 2.18 L
FEV6FVC-%Change-Post: 1 %
FEV6FVC-%Pred-Post: 103 %
FEV6FVC-%Pred-Pre: 102 %
FVC-%Change-Post: 5 %
FVC-%Pred-Post: 72 %
FVC-%Pred-Pre: 68 %
FVC-Post: 2.33 L
FVC-Pre: 2.22 L
Post FEV1/FVC ratio: 63 %
Post FEV6/FVC ratio: 99 %
Pre FEV1/FVC ratio: 61 %
Pre FEV6/FVC Ratio: 98 %
RV % pred: 121 %
RV: 2.79 L
TLC % pred: 93 %
TLC: 5 L

## 2016-04-01 MED ORDER — ALBUTEROL SULFATE (2.5 MG/3ML) 0.083% IN NEBU
2.5000 mg | INHALATION_SOLUTION | Freq: Once | RESPIRATORY_TRACT | Status: AC
  Administered 2016-04-01: 2.5 mg via RESPIRATORY_TRACT

## 2016-04-05 ENCOUNTER — Encounter: Payer: Self-pay | Admitting: Internal Medicine

## 2016-04-05 ENCOUNTER — Encounter (HOSPITAL_COMMUNITY): Payer: Self-pay

## 2016-04-07 ENCOUNTER — Encounter (HOSPITAL_COMMUNITY)
Admission: RE | Admit: 2016-04-07 | Discharge: 2016-04-07 | Disposition: A | Discharge status: Home / Self Care | Payer: Self-pay | Source: Ambulatory Visit | Attending: Internal Medicine | Admitting: Internal Medicine

## 2016-04-12 ENCOUNTER — Encounter (HOSPITAL_COMMUNITY): Payer: Self-pay

## 2016-04-12 DIAGNOSIS — R0600 Dyspnea, unspecified: Secondary | ICD-10-CM | POA: Insufficient documentation

## 2016-04-12 DIAGNOSIS — Z954 Presence of other heart-valve replacement: Secondary | ICD-10-CM | POA: Insufficient documentation

## 2016-04-13 ENCOUNTER — Other Ambulatory Visit: Payer: Self-pay | Admitting: Family Medicine

## 2016-04-13 ENCOUNTER — Other Ambulatory Visit: Payer: Self-pay | Admitting: Internal Medicine

## 2016-04-13 ENCOUNTER — Other Ambulatory Visit: Payer: Self-pay | Admitting: Cardiovascular Disease

## 2016-04-13 NOTE — Telephone Encounter (Signed)
MR - please advise on PFT results.

## 2016-04-14 ENCOUNTER — Encounter (HOSPITAL_COMMUNITY)
Admission: RE | Admit: 2016-04-14 | Discharge: 2016-04-14 | Disposition: A | Discharge status: Home / Self Care | Payer: Self-pay | Source: Ambulatory Visit | Attending: Internal Medicine | Admitting: Internal Medicine

## 2016-04-19 ENCOUNTER — Encounter (HOSPITAL_COMMUNITY): Payer: Self-pay

## 2016-04-21 ENCOUNTER — Encounter (HOSPITAL_COMMUNITY)
Admission: RE | Admit: 2016-04-21 | Discharge: 2016-04-21 | Disposition: A | Discharge status: Home / Self Care | Payer: Self-pay | Source: Ambulatory Visit | Attending: Internal Medicine | Admitting: Internal Medicine

## 2016-04-26 ENCOUNTER — Encounter (HOSPITAL_COMMUNITY)
Admission: RE | Admit: 2016-04-26 | Discharge: 2016-04-26 | Disposition: A | Discharge status: Home / Self Care | Payer: Self-pay | Source: Ambulatory Visit | Attending: Internal Medicine | Admitting: Internal Medicine

## 2016-04-27 ENCOUNTER — Encounter: Payer: Self-pay | Admitting: Internal Medicine

## 2016-04-27 NOTE — Telephone Encounter (Signed)
MR - Pt is still waiting on PFT results.  PLEASE ADVISE! Thanks!

## 2016-04-28 ENCOUNTER — Encounter (HOSPITAL_COMMUNITY)
Admission: RE | Admit: 2016-04-28 | Discharge: 2016-04-28 | Disposition: A | Discharge status: Home / Self Care | Payer: Self-pay | Source: Ambulatory Visit | Attending: Internal Medicine | Admitting: Internal Medicine

## 2016-04-29 ENCOUNTER — Telehealth: Payer: Self-pay | Admitting: Internal Medicine

## 2016-04-29 NOTE — Telephone Encounter (Signed)
Called and spoke with pt and she is aware of results per MR.  She will keep the appt with Dr. Ardis Hughs.  Nothing further is needed.

## 2016-04-29 NOTE — Telephone Encounter (Signed)
pls pass on 10000 apologies to [place] - I kind of missed the patient advice request space while focusing on telephone mssages. Good news is that PFT better  Fev1 1.35L/55% while it ws 34% in 2015. Ratio 61.  This is good improvement from severe in 2015 to moderate obstruction now. DLCO 19.2/71%. Therefore, she might tolerate moderate sedation for colonoscopy but she can see Dr Ardis Hughs of GI 06/07/16 and then he and I can discuss best sedation for her

## 2016-05-03 ENCOUNTER — Encounter (HOSPITAL_COMMUNITY)
Admission: RE | Admit: 2016-05-03 | Discharge: 2016-05-03 | Disposition: A | Discharge status: Home / Self Care | Payer: Self-pay | Source: Ambulatory Visit | Attending: Internal Medicine | Admitting: Internal Medicine

## 2016-05-05 ENCOUNTER — Encounter (HOSPITAL_COMMUNITY)
Admission: RE | Admit: 2016-05-05 | Discharge: 2016-05-05 | Disposition: A | Discharge status: Home / Self Care | Payer: Self-pay | Source: Ambulatory Visit | Attending: Internal Medicine | Admitting: Internal Medicine

## 2016-05-10 ENCOUNTER — Encounter (HOSPITAL_COMMUNITY)
Admission: RE | Admit: 2016-05-10 | Discharge: 2016-05-10 | Disposition: A | Discharge status: Home / Self Care | Payer: Self-pay | Source: Ambulatory Visit | Attending: Internal Medicine | Admitting: Internal Medicine

## 2016-05-10 DIAGNOSIS — Z954 Presence of other heart-valve replacement: Secondary | ICD-10-CM | POA: Insufficient documentation

## 2016-05-10 DIAGNOSIS — R0600 Dyspnea, unspecified: Secondary | ICD-10-CM | POA: Insufficient documentation

## 2016-05-12 ENCOUNTER — Other Ambulatory Visit: Payer: Self-pay | Admitting: Internal Medicine

## 2016-05-12 ENCOUNTER — Encounter (HOSPITAL_COMMUNITY): Payer: Self-pay

## 2016-05-12 ENCOUNTER — Encounter: Payer: Self-pay | Admitting: *Deleted

## 2016-05-12 ENCOUNTER — Other Ambulatory Visit: Payer: Self-pay | Admitting: Cardiovascular Disease

## 2016-05-17 ENCOUNTER — Encounter (HOSPITAL_COMMUNITY)
Admission: RE | Admit: 2016-05-17 | Discharge: 2016-05-17 | Disposition: A | Discharge status: Home / Self Care | Payer: Self-pay | Source: Ambulatory Visit | Attending: Internal Medicine | Admitting: Internal Medicine

## 2016-05-17 ENCOUNTER — Telehealth: Payer: Self-pay | Admitting: Internal Medicine

## 2016-05-17 NOTE — Telephone Encounter (Signed)
OK, thanks.  I'll be seeing her in a couple weeks in the office, will likely steer her towards colonoscopy at [facility].

## 2016-05-17 NOTE — Telephone Encounter (Signed)
Hi  For [name] her PFTs are better - currently Fev1 1.35L/55% with Ratio 61 while it ws 34% in 2015.   This is good improvement from severe in 2015 to moderate obstruction now. DLCO 19.2/71%. Therefore, she might tolerate moderate sedation for colonoscopy but if yo uhave any concerns you can do at [facility] instead of [facility] 4th floor gien her age, cardiac and pulm status  Thanks  Dr. Brand Males, [name], [facility].C.P Pulmonary and Critical Care Medicine Staff Physician [place] [facility] Pager: [phone], If no answer or between  15:00h - 7:00h: call [phone]  05/17/2016 12:46 AM

## 2016-05-18 NOTE — Telephone Encounter (Signed)
Noted Dr Ardis Hughs note  Dr. Brand Males, [name], [facility].C.P Pulmonary and Critical Care Medicine Staff Physician [place] [facility] Pager: [phone], If no answer or between  15:00h - 7:00h: call [phone]  05/18/2016 1:46 AM

## 2016-05-19 ENCOUNTER — Encounter (HOSPITAL_COMMUNITY)
Admission: RE | Admit: 2016-05-19 | Discharge: 2016-05-19 | Disposition: A | Discharge status: Home / Self Care | Payer: Self-pay | Source: Ambulatory Visit | Attending: Internal Medicine | Admitting: Internal Medicine

## 2016-05-24 ENCOUNTER — Encounter (HOSPITAL_COMMUNITY)
Admission: RE | Admit: 2016-05-24 | Discharge: 2016-05-24 | Disposition: A | Discharge status: Home / Self Care | Payer: Self-pay | Source: Ambulatory Visit | Attending: Internal Medicine | Admitting: Internal Medicine

## 2016-05-26 ENCOUNTER — Encounter (HOSPITAL_COMMUNITY)
Admission: RE | Admit: 2016-05-26 | Discharge: 2016-05-26 | Disposition: A | Discharge status: Home / Self Care | Payer: Self-pay | Source: Ambulatory Visit | Attending: Internal Medicine | Admitting: Internal Medicine

## 2016-05-31 ENCOUNTER — Encounter (HOSPITAL_COMMUNITY)
Admission: RE | Admit: 2016-05-31 | Discharge: 2016-05-31 | Disposition: A | Discharge status: Home / Self Care | Payer: Self-pay | Source: Ambulatory Visit | Attending: Internal Medicine | Admitting: Internal Medicine

## 2016-06-02 ENCOUNTER — Encounter (HOSPITAL_COMMUNITY)
Admission: RE | Admit: 2016-06-02 | Discharge: 2016-06-02 | Disposition: A | Discharge status: Home / Self Care | Payer: Self-pay | Source: Ambulatory Visit | Attending: Internal Medicine | Admitting: Internal Medicine

## 2016-06-07 ENCOUNTER — Ambulatory Visit (INDEPENDENT_AMBULATORY_CARE_PROVIDER_SITE_OTHER): Payer: Medicare Other | Admitting: Gastroenterology

## 2016-06-07 ENCOUNTER — Encounter (HOSPITAL_COMMUNITY): Payer: Self-pay

## 2016-06-07 ENCOUNTER — Encounter: Payer: Self-pay | Admitting: Gastroenterology

## 2016-06-07 ENCOUNTER — Encounter (HOSPITAL_COMMUNITY): Payer: Self-pay | Admitting: *Deleted

## 2016-06-07 VITALS — BP 128/70 | HR 84 | Ht 66.0 in | Wt 213.4 lb

## 2016-06-07 DIAGNOSIS — Z86010 Personal history of colonic polyps: Secondary | ICD-10-CM | POA: Diagnosis not present

## 2016-06-07 DIAGNOSIS — Z85048 Personal history of other malignant neoplasm of rectum, rectosigmoid junction, and anus: Secondary | ICD-10-CM | POA: Diagnosis not present

## 2016-06-07 MED ORDER — NA SULFATE-K SULFATE-MG SULF 17.5-3.13-1.6 GM/177ML PO SOLN: 1.0000 | Freq: Once | ORAL | 0 refills | Status: AC

## 2016-06-07 NOTE — Progress Notes (Signed)
Review of pertinent gastrointestinal problems: 1. Rectal cancer, midrectum ypT3ypN0, s/p neoadj chemoXRT, lap LAR with diverting loop ileostomy 08/2011, ileostomy takedown 03/2012.  Colonoscopy 03/2013 found 1 small TA (next recall 3 years), LAR anastomosis was a bit narrowed, edematous and dilated to 2cm with balloon.  HPI: This is a  very pleasant 70 year old woman whom I last saw about 3 years ago the time of a colonoscopy. See those results summarized above  Chief complaint is personal history of colon cancer  ROS: complete GI ROS as described in HPI.  Constitutional:  No unintentional weight loss  2015 AVR/MVR bioprosthetic  09/2015 echo" EGD 65%; has chronic diastolic heart failure.  Med management.  Not on blood thinners.  Gets sob with a lot of exercise, no need for oxygen.  Had cholecystectomy in 2016; had  A lot of stools at first but that mainly improved.  Several solid stools a day.  She had pneumonia 2-3 months ago and was hospitalized for this. She has been attending pulmonary rehabilitation twice weekly since then has been doing very well.  Past Medical History:  Diagnosis Date  . Acute cystitis without hematuria 12/22/2015  . Anemia   . Anxiety   . Arthritis   . Asthma   . Cataracts, bilateral    immature  . Chronic back pain    stenosis  . Chronic diastolic heart failure ([place]) 09/19/2014  . Chronic insomnia   . Constipation    takes Miralax daily as needed  . COPD (chronic obstructive pulmonary disease) (HCC)    Albuterol inhaler daily as needed;Duoneb daily as needed;Spiriva daily  . DDD (degenerative disc disease)    cervical - kyphosis with mod DD changes C5/6 and C6/7; lumbar - early DD at L2/3 ([place])  . DOE (dyspnea on exertion)   . Essential hypertension    was on meds but after appointment Dec 11 with Dr.C he took her off  . GERD (gastroesophageal reflux disease)    takes Protonix daily  . History of blood transfusion   . History of pneumonia    several times  . History of radiation therapy 05/30/11 to 07/07/11   rectum  . History of shingles   . Hyperlipemia    takes Lovastatin daily  . IBS (irritable bowel syndrome)   . Lung nodule    RLL nodule-23mm stable 2006, April 2009, and June 2009  . Obesity   . Osteopenia 12/2014   T -1.5 hip  . Pancreatitis 11/2014   ?zpack related vs gallstone pancreatitis with abnormal HIDA scan pending cholecystectomy  . Pancreatitis, acute 11/16/2014  . Pneumonia 10/2013  . PONV (postoperative nausea and vomiting)   . Rectal cancer ([place]) 04/2011   T3N0; s/p lap LAR, s/p ileostomy, s/p reversal, s/p chemo  . Rheumatic heart disease mitral stenosis    mod MS by echo 02/2014  . S/P AVR (aortic valve replacement) 2015   bioprosthetic ([place])  . S/P MVR (mitral valve replacement) 2015   bioprosthetic ([place])  . Seizures ([place])    febrile seizures as a child  . Severe aortic valve stenosis  AVR 3/15 10/01/2013   mild-mod by echo 02/2014  . Sleep apnea    PT TOLD BORDERLINE-TRIED CPAP-DID NOT HELP-SHE DOES NOT USE CPAP  . Small bowel obstruction, partial ([place]) 08/2013   reolved without NGT placement.   . Vitamin B12 deficiency 02/28/2015   Start B12 shots 02/2015     Past Surgical History:  Procedure Laterality Date  . AORTIC VALVE REPLACEMENT  N/A 12/12/2013   Procedure: AORTIC VALVE REPLACEMENT (AVR);  Surgeon: Gaye Pollack, MD; Service: Open Heart Surgery  . BACK SURGERY  2006, 2007   2006 SPACER, 2007 decompression and fusion L4/5  . BOWEL RESECTION  04/02/2012   Procedure: SMALL BOWEL RESECTION;  Surgeon: Stark Klein, MD;  Location: [facility];  Service: General;  Laterality: N/A;  . CARPAL TUNNEL RELEASE Bilateral   . CHOLECYSTECTOMY N/A 01/21/2015   chronic cholecystitis, Stark Klein, MD  . COLON RESECTION  08/25/2011   Procedure: COLON RESECTION LAPAROSCOPIC;  Surgeon: Stark Klein, MD;  Location: [facility];  Service: General;  Laterality: N/A;  Laparoscopic Assisted Low Anterior Resection  Diverting Ostomy and onQ pain pump  . COLONOSCOPY  03/2013   1 polyp, rpt 3 yrs Ardis Hughs)  . FOOT SURGERY  left foot   hammer toe  . HAND SURGERY  Bil   carpal tunnel  . ILEOSTOMY  08/25/2011  . ILEOSTOMY CLOSURE  04/02/2012   Procedure: ILEOSTOMY TAKEDOWN;  Surgeon: Stark Klein, MD;  Location: [facility];  Service: General;  Laterality: N/A;  . INTRAOPERATIVE TRANSESOPHAGEAL ECHOCARDIOGRAM N/A 12/12/2013   Procedure: INTRAOPERATIVE TRANSESOPHAGEAL ECHOCARDIOGRAM;  Surgeon: Gaye Pollack, MD;  Location: [facility];  Service: Open Heart Surgery;  Laterality: N/A;  . LEFT AND RIGHT HEART CATHETERIZATION WITH CORONARY ANGIOGRAM N/A 11/27/2013   Procedure: LEFT AND RIGHT HEART CATHETERIZATION WITH CORONARY ANGIOGRAM;  Surgeon: Sanda Klein, MD;  Location: [facility] CATH [facility];  Service: Cardiovascular;  Laterality: N/A;  . MITRAL VALVE REPLACEMENT N/A 12/12/2013   Procedure: MITRAL VALVE (MV) REPLACEMENT OR REPAIR;  Surgeon: Gaye Pollack, MD; Service: Open Heart Surgery  . TEE WITHOUT CARDIOVERSION N/A 11/29/2013   Procedure: TRANSESOPHAGEAL ECHOCARDIOGRAM (TEE);  Surgeon: Sueanne Margarita, MD;  Location[facility] ENDOSCOPY;  Service: Cardiovascular;  Laterality: N/A;  . TONSILLECTOMY      Current Outpatient Prescriptions  Medication Sig Dispense Refill  . albuterol (PROVENTIL HFA;VENTOLIN HFA) 108 (90 Base) MCG/ACT inhaler Inhale 2 puffs into the lungs every 6 (six) hours as needed for wheezing. 1 Inhaler 2  . amoxicillin (AMOXIL) 500 MG capsule take 4 capsules by mouth 30 TO 60 MINUTES PRIOR TO DENTAL APPOINTMENT. 4 capsule 12  . aspirin 81 MG tablet Take 81 mg by mouth at bedtime.     Marland Kitchen BIOTIN PO Take 1 tablet by mouth at bedtime.     . bisoprolol (ZEBETA) 5 MG tablet take 1/2 tablet by mouth once daily 15 tablet 11  . BREO ELLIPTA 200-25 MCG/INH AEPB inhale 1 puff INTO THE LUNGS daily 60 each 6  . Calcium Carbonate-Vitamin D (CALCIUM 600 + D PO) Take 600 mg by mouth at bedtime.     . Cholecalciferol (VITAMIN D)  2000 UNITS CAPS Take 2,000 Units by mouth at bedtime.     . fluocinonide (LIDEX) 0.05 % external solution apply 1 APPLICATION ON THE SCALP AS NEEDED FOR ITCHING  0  . furosemide (LASIX) 40 MG tablet Take one tablet by mouth daily 5 times a week. 25 tablet 11  . ipratropium-albuterol (DUONEB) 0.5-2.5 (3) MG/3ML SOLN Take 3 mLs by nebulization every 6 (six) hours as needed. (Patient taking differently: Take 3 mLs by nebulization every 6 (six) hours as needed (for wheezing or shortness of breath). ) 360 mL 0  . lovastatin (MEVACOR) 40 MG tablet take 1 tablet at bedtime 30 tablet 6  . Miconazole Nitrate 2 % POWD Apply 1 application topically daily as needed (for skin). Apply to stomach and under  breasts    . montelukast (SINGULAIR) 10 MG tablet take 1 tablet by mouth at bedtime 30 tablet 5  . Multiple Vitamins-Minerals (PRESERVISION AREDS 2 PO) Take 1 capsule by mouth daily.    . Na Sulfate-K Sulfate-Mg Sulf 17.5-3.13-1.6 GM/180ML SOLN Suprep (no substitutions)-TAKE AS DIRECTED. 354 mL 0  . pantoprazole (PROTONIX) 40 MG tablet take 1 tablet by mouth at bedtime 90 tablet 1  . polycarbophil (FIBERCON) 625 MG tablet Take 625 mg by mouth daily.    . polyethylene glycol (MIRALAX / GLYCOLAX) packet Take 17 g by mouth daily. (Patient taking differently: Take 17 g by mouth daily as needed. ) 14 each 0  . potassium chloride (K-DUR) 10 MEQ tablet take 2 tablets by mouth once daily 60 tablet 11  . solifenacin (VESICARE) 10 MG tablet Take 1 tablet (10 mg total) by mouth daily. 30 tablet 11  . SPIRIVA HANDIHALER 18 MCG inhalation capsule inhale the contents of one capsule in the handihaler once daily 30 capsule 5  . traMADol (ULTRAM) 50 MG tablet Take 1 tablet (50 mg total) by mouth every 6 (six) hours as needed for moderate pain. 30 tablet 0  . traZODone (DESYREL) 50 MG tablet Take 1 tablet (50 mg total) by mouth at bedtime. 30 tablet 11  . vitamin B-12 (CYANOCOBALAMIN) 500 MCG tablet Take 500 mcg by mouth daily.      No current facility-administered medications for this visit.     Allergies as of 06/07/2016 - Review Complete 06/07/2016  Allergen Reaction Noted  . Codeine Nausea Only   . Prednisone Hives   . Sulfonamide derivatives Nausea Only     Family History  Problem Relation Age of Onset  . Lung cancer Father   . Heart disease Mother   . Diabetes Mother   . Hypertension Mother   . Stroke Mother   . CAD Son 30  . Heart attack Son 69  . Heart attack Maternal Grandfather   . Colon cancer Neg Hx   . Esophageal cancer Neg Hx   . Stomach cancer Neg Hx   . Rectal cancer Neg Hx     Social History   Social History  . Marital status: Married    Spouse name: N/A  . Number of children: N/A  . Years of education: N/A   Occupational History  . retired    Social History Main Topics  . Smoking status: Former Smoker    Packs/day: 1.50    Years: 10.00    Types: Cigarettes    Quit date: 12/28/1974  . Smokeless tobacco: Never Used     Comment: quit smoking in 1975/02/07  . Alcohol use 0.0 oz/week     Comment: rarely wine  . Drug use: No  . Sexual activity: No   Other Topics Concern  . Not on file   Social History Narrative   Widow. Husband died 04/09/15 MVA. 1 cat.    2 sons; 1 grandchild. One son died of MI 50   Married February 06, 1965   Retired-[employer], volunteers at [employer]   Activity: pulm rehab   Diet: some water, fruits/vegetables daily     Physical Exam: BP 128/70 (BP Location: Left Arm, Patient Position: Sitting, Cuff Size: Normal)   Pulse 84   Ht 5\' 6"  (1.676 m)   Wt 213 lb 6.4 oz (96.8 kg)   BMI 34.44 kg/m  Constitutional: generally well-appearing Psychiatric: alert and oriented x3 Abdomen: soft, nontender, nondistended, no obvious ascites, no peritoneal signs, normal bowel sounds No  peripheral edema noted in lower extremities  Assessment and plan: 70 y.o. female with Personal history of rectal cancer  She is due for rectal cancer, colon cancer screening high risk given her  personal history of rectal cancer. She has diastolic heart failure, lung issues, however she is not on oxygen and is actually doing very well in pulmonary rehabilitation. She lives alone and gets around her house very well and is only short of breath that she really exerts herself. I think she is safe, reasonable to proceed with repeat colonoscopy at her soonest convenience. Given her health concerns I do think it is safest that we do this at the hospital with anesthesia assistance.   Owens Loffler, MD [facility] 06/07/2016, 11:08 AM

## 2016-06-07 NOTE — Patient Instructions (Addendum)
You will be set up for a colonoscopy for personal history of colon cancer ([facility] hosp, this Thursday.)

## 2016-06-09 ENCOUNTER — Encounter (HOSPITAL_COMMUNITY): Payer: Self-pay

## 2016-06-09 ENCOUNTER — Ambulatory Visit (HOSPITAL_COMMUNITY): Payer: Medicare Other | Admitting: Anesthesiology

## 2016-06-09 ENCOUNTER — Encounter (HOSPITAL_COMMUNITY)
Admission: RE | Disposition: A | Discharge status: Home / Self Care | Payer: Self-pay | Source: Ambulatory Visit | Attending: Gastroenterology

## 2016-06-09 ENCOUNTER — Ambulatory Visit (HOSPITAL_COMMUNITY)
Admission: RE | Admit: 2016-06-09 | Discharge: 2016-06-09 | Disposition: A | Discharge status: Home / Self Care | Payer: Medicare Other | Source: Ambulatory Visit | Attending: Gastroenterology | Admitting: Gastroenterology

## 2016-06-09 DIAGNOSIS — Z6834 Body mass index (BMI) 34.0-34.9, adult: Secondary | ICD-10-CM | POA: Diagnosis not present

## 2016-06-09 DIAGNOSIS — J449 Chronic obstructive pulmonary disease, unspecified: Secondary | ICD-10-CM | POA: Insufficient documentation

## 2016-06-09 DIAGNOSIS — Z85048 Personal history of other malignant neoplasm of rectum, rectosigmoid junction, and anus: Secondary | ICD-10-CM | POA: Insufficient documentation

## 2016-06-09 DIAGNOSIS — Z952 Presence of prosthetic heart valve: Secondary | ICD-10-CM | POA: Diagnosis not present

## 2016-06-09 DIAGNOSIS — I5032 Chronic diastolic (congestive) heart failure: Secondary | ICD-10-CM | POA: Insufficient documentation

## 2016-06-09 DIAGNOSIS — E669 Obesity, unspecified: Secondary | ICD-10-CM | POA: Insufficient documentation

## 2016-06-09 DIAGNOSIS — G4730 Sleep apnea, unspecified: Secondary | ICD-10-CM | POA: Insufficient documentation

## 2016-06-09 DIAGNOSIS — K219 Gastro-esophageal reflux disease without esophagitis: Secondary | ICD-10-CM | POA: Insufficient documentation

## 2016-06-09 DIAGNOSIS — Z7982 Long term (current) use of aspirin: Secondary | ICD-10-CM | POA: Insufficient documentation

## 2016-06-09 DIAGNOSIS — Z980 Intestinal bypass and anastomosis status: Secondary | ICD-10-CM | POA: Diagnosis not present

## 2016-06-09 DIAGNOSIS — Z9049 Acquired absence of other specified parts of digestive tract: Secondary | ICD-10-CM | POA: Insufficient documentation

## 2016-06-09 DIAGNOSIS — Z87891 Personal history of nicotine dependence: Secondary | ICD-10-CM | POA: Insufficient documentation

## 2016-06-09 DIAGNOSIS — E785 Hyperlipidemia, unspecified: Secondary | ICD-10-CM | POA: Diagnosis not present

## 2016-06-09 DIAGNOSIS — Z860100 Personal history of colon polyps, unspecified: Secondary | ICD-10-CM

## 2016-06-09 DIAGNOSIS — Z9221 Personal history of antineoplastic chemotherapy: Secondary | ICD-10-CM | POA: Insufficient documentation

## 2016-06-09 DIAGNOSIS — Z79899 Other long term (current) drug therapy: Secondary | ICD-10-CM | POA: Insufficient documentation

## 2016-06-09 DIAGNOSIS — I110 Hypertensive heart disease with heart failure: Secondary | ICD-10-CM | POA: Diagnosis not present

## 2016-06-09 DIAGNOSIS — Z1211 Encounter for screening for malignant neoplasm of colon: Secondary | ICD-10-CM | POA: Insufficient documentation

## 2016-06-09 DIAGNOSIS — Z85038 Personal history of other malignant neoplasm of large intestine: Secondary | ICD-10-CM | POA: Diagnosis not present

## 2016-06-09 DIAGNOSIS — Z923 Personal history of irradiation: Secondary | ICD-10-CM | POA: Insufficient documentation

## 2016-06-09 HISTORY — PX: COLONOSCOPY WITH PROPOFOL: SHX5780

## 2016-06-09 SURGERY — COLONOSCOPY WITH PROPOFOL
Anesthesia: Monitor Anesthesia Care

## 2016-06-09 MED ORDER — SODIUM CHLORIDE 0.9 % IV SOLN: INTRAVENOUS | Status: DC

## 2016-06-09 MED ORDER — LIDOCAINE 2% (20 MG/ML) 5 ML SYRINGE
INTRAMUSCULAR | Status: AC
  Filled 2016-06-09: qty 5

## 2016-06-09 MED ORDER — PROPOFOL 10 MG/ML IV BOLUS
INTRAVENOUS | Status: AC
  Filled 2016-06-09: qty 40

## 2016-06-09 MED ORDER — PROPOFOL 500 MG/50ML IV EMUL
INTRAVENOUS | Status: DC | PRN
  Administered 2016-06-09: 100 ug/kg/min via INTRAVENOUS

## 2016-06-09 MED ORDER — LACTATED RINGERS IV SOLN
INTRAVENOUS | Status: DC
  Administered 2016-06-09: 12:00:00 via INTRAVENOUS

## 2016-06-09 MED ORDER — PROPOFOL 500 MG/50ML IV EMUL
INTRAVENOUS | Status: DC | PRN
  Administered 2016-06-09: 40 mg via INTRAVENOUS

## 2016-06-09 MED ORDER — LIDOCAINE HCL (CARDIAC) 20 MG/ML IV SOLN
INTRAVENOUS | Status: DC | PRN
  Administered 2016-06-09: 50 mg via INTRATRACHEAL

## 2016-06-09 SURGICAL SUPPLY — 21 items

## 2016-06-09 NOTE — Op Note (Signed)
[facility] Patient Name: [name] Procedure Date: 06/09/2016 MRN: [id number] Attending MD: [name] , MD Date of Birth: 1946/02/06 CSN: [id number] Age: 70 Admit Type: Outpatient Procedure:                Colonoscopy Indications:              High risk colon cancer surveillance: Personal                            history of colon cancer Rectal cancer,midrectum                            ypT3ypN0, s/p neoadj chemoXRT, lap LAR with                            diverting loop ileostomy 08/2011, ileostomy                            takedown 03/2012. Colonoscopy 03/2013 found 1 small                            TA (next recall 3 years), LAR anastomosis was a bit                            narrowed, edematous and dilated to 2cm with balloon. Providers:                [name], MD, Lurline Del, RN, Alfonso Patten,                            Technician, Rosario Adie, CRNA Referring MD:              Medicines:                Monitored Anesthesia Care Complications:            No immediate complications. Estimated blood loss:                            None. Estimated Blood Loss:     Estimated blood loss: none. Procedure:                Pre-Anesthesia Assessment:                           - Prior to the procedure, a History and Physical                            was performed, and patient medications and                            allergies were reviewed. The patient's tolerance of                            previous anesthesia was also reviewed. The risks  and benefits of the procedure and the sedation                            options and risks were discussed with the patient.                            All questions were answered, and informed consent                            was obtained. Prior Anticoagulants: The patient has                            taken no previous anticoagulant or antiplatelet      agents. ASA Grade Assessment: III - A patient with                            severe systemic disease. After reviewing the risks                            and benefits, the patient was deemed in                            satisfactory condition to undergo the procedure.                           After obtaining informed consent, the colonoscope                            was passed under direct vision. Throughout the                            procedure, the patient's blood pressure, pulse, and                            oxygen saturations were monitored continuously. The                            [id number] scope was introduced through                            the anus and advanced to the the cecum, identified                            by appendiceal orifice and ileocecal valve. The                            colonoscopy was performed without difficulty. The                            patient tolerated the procedure well. The quality                            of the bowel preparation was excellent. The  ileocecal valve, appendiceal orifice, and rectum                            were photographed. Scope In: 11:59:56 AM Scope Out: 12:10:22 PM Scope Withdrawal Time: 0 hours 5 minutes 45 seconds  Total Procedure Duration: 0 hours 10 minutes 26 seconds  Findings:      There was evidence of a prior end-to-end colo-colonic anastomosis in the       rectum. The anastomosis was edematous but otherwise normal.      The exam was otherwise without abnormality on direct and retroflexion       views. Impression:               - Patent end-to-end colo-colonic anastomosis.                           - The examination was otherwise normal on direct                            and retroflexion views.                           - No polyps or cancers Moderate Sedation:      N/A- Per Anesthesia Care Recommendation:           - Patient has a contact number available  for                            emergencies. The signs and symptoms of potential                            delayed complications were discussed with the                            patient. Return to normal activities tomorrow.                            Written discharge instructions were provided to the                            patient.                           - Resume previous diet.                           - Continue present medications.                           - Repeat colonoscopy in 5 years for screening                            purposes. Procedure Code(s):        --- Professional ---                           [phone], Colonoscopy, flexible; diagnostic, including  collection of specimen(s) by brushing or washing,                            when performed (separate procedure) Diagnosis Code(s):        --- Professional ---                           [id number], Personal history of other malignant                            neoplasm of large intestine                           Z98.0, Intestinal bypass and anastomosis status CPT copyright 2016 [employer]. All rights reserved. The codes documented in this report are preliminary and upon coder review may  be revised to meet current compliance requirements. [name], MD 06/09/2016 12:14:53 PM This report has been signed electronically. Number of Addenda: 0

## 2016-06-09 NOTE — H&P (View-Only) (Signed)
Review of pertinent gastrointestinal problems: 1. Rectal cancer, midrectum ypT3ypN0, s/p neoadj chemoXRT, lap LAR with diverting loop ileostomy 08/2011, ileostomy takedown 03/2012.  Colonoscopy 03/2013 found 1 small TA (next recall 3 years), LAR anastomosis was a bit narrowed, edematous and dilated to 2cm with balloon.  HPI: This is a  very pleasant 70 year old woman whom I last saw about 3 years ago the time of a colonoscopy. See those results summarized above  Chief complaint is personal history of colon cancer  ROS: complete GI ROS as described in HPI.  Constitutional:  No unintentional weight loss  2015 AVR/MVR bioprosthetic  09/2015 echo" EGD 65%; has chronic diastolic heart failure.  Med management.  Not on blood thinners.  Gets sob with a lot of exercise, no need for oxygen.  Had cholecystectomy in 2016; had  A lot of stools at first but that mainly improved.  Several solid stools a day.  She had pneumonia 2-3 months ago and was hospitalized for this. She has been attending pulmonary rehabilitation twice weekly since then has been doing very well.  Past Medical History:  Diagnosis Date  . Acute cystitis without hematuria 12/22/2015  . Anemia   . Anxiety   . Arthritis   . Asthma   . Cataracts, bilateral    immature  . Chronic back pain    stenosis  . Chronic diastolic heart failure ([place]) 09/19/2014  . Chronic insomnia   . Constipation    takes Miralax daily as needed  . COPD (chronic obstructive pulmonary disease) (HCC)    Albuterol inhaler daily as needed;Duoneb daily as needed;Spiriva daily  . DDD (degenerative disc disease)    cervical - kyphosis with mod DD changes C5/6 and C6/7; lumbar - early DD at L2/3 ([place])  . DOE (dyspnea on exertion)   . Essential hypertension    was on meds but after appointment Dec 11 with Dr.C he took her off  . GERD (gastroesophageal reflux disease)    takes Protonix daily  . History of blood transfusion   . History of pneumonia    several times  . History of radiation therapy 05/30/11 to 07/07/11   rectum  . History of shingles   . Hyperlipemia    takes Lovastatin daily  . IBS (irritable bowel syndrome)   . Lung nodule    RLL nodule-32mm stable 2006, April 2009, and June 2009  . Obesity   . Osteopenia 12/2014   T -1.5 hip  . Pancreatitis 11/2014   ?zpack related vs gallstone pancreatitis with abnormal HIDA scan pending cholecystectomy  . Pancreatitis, acute 11/16/2014  . Pneumonia 10/2013  . PONV (postoperative nausea and vomiting)   . Rectal cancer ([place]) 04/2011   T3N0; s/p lap LAR, s/p ileostomy, s/p reversal, s/p chemo  . Rheumatic heart disease mitral stenosis    mod MS by echo 02/2014  . S/P AVR (aortic valve replacement) 2015   bioprosthetic ([place])  . S/P MVR (mitral valve replacement) 2015   bioprosthetic ([place])  . Seizures ([place])    febrile seizures as a child  . Severe aortic valve stenosis  AVR 3/15 10/01/2013   mild-mod by echo 02/2014  . Sleep apnea    PT TOLD BORDERLINE-TRIED CPAP-DID NOT HELP-SHE DOES NOT USE CPAP  . Small bowel obstruction, partial ([place]) 08/2013   reolved without NGT placement.   . Vitamin B12 deficiency 02/28/2015   Start B12 shots 02/2015     Past Surgical History:  Procedure Laterality Date  . AORTIC VALVE REPLACEMENT  N/A 12/12/2013   Procedure: AORTIC VALVE REPLACEMENT (AVR);  Surgeon: Gaye Pollack, MD; Service: Open Heart Surgery  . BACK SURGERY  2006, 2007   2006 SPACER, 2007 decompression and fusion L4/5  . BOWEL RESECTION  04/02/2012   Procedure: SMALL BOWEL RESECTION;  Surgeon: Stark Klein, MD;  Location: [facility];  Service: General;  Laterality: N/A;  . CARPAL TUNNEL RELEASE Bilateral   . CHOLECYSTECTOMY N/A 01/21/2015   chronic cholecystitis, Stark Klein, MD  . COLON RESECTION  08/25/2011   Procedure: COLON RESECTION LAPAROSCOPIC;  Surgeon: Stark Klein, MD;  Location: [facility];  Service: General;  Laterality: N/A;  Laparoscopic Assisted Low Anterior Resection  Diverting Ostomy and onQ pain pump  . COLONOSCOPY  03/2013   1 polyp, rpt 3 yrs Ardis Hughs)  . FOOT SURGERY  left foot   hammer toe  . HAND SURGERY  Bil   carpal tunnel  . ILEOSTOMY  08/25/2011  . ILEOSTOMY CLOSURE  04/02/2012   Procedure: ILEOSTOMY TAKEDOWN;  Surgeon: Stark Klein, MD;  Location: [facility];  Service: General;  Laterality: N/A;  . INTRAOPERATIVE TRANSESOPHAGEAL ECHOCARDIOGRAM N/A 12/12/2013   Procedure: INTRAOPERATIVE TRANSESOPHAGEAL ECHOCARDIOGRAM;  Surgeon: Gaye Pollack, MD;  Location: [facility];  Service: Open Heart Surgery;  Laterality: N/A;  . LEFT AND RIGHT HEART CATHETERIZATION WITH CORONARY ANGIOGRAM N/A 11/27/2013   Procedure: LEFT AND RIGHT HEART CATHETERIZATION WITH CORONARY ANGIOGRAM;  Surgeon: Sanda Klein, MD;  Location: [facility];  Service: Cardiovascular;  Laterality: N/A;  . MITRAL VALVE REPLACEMENT N/A 12/12/2013   Procedure: MITRAL VALVE (MV) REPLACEMENT OR REPAIR;  Surgeon: Gaye Pollack, MD; Service: Open Heart Surgery  . TEE WITHOUT CARDIOVERSION N/A 11/29/2013   Procedure: TRANSESOPHAGEAL ECHOCARDIOGRAM (TEE);  Surgeon: Sueanne Margarita, MD;  Location: [facility] ENDOSCOPY;  Service: Cardiovascular;  Laterality: N/A;  . TONSILLECTOMY      Current Outpatient Prescriptions  Medication Sig Dispense Refill  . albuterol (PROVENTIL HFA;VENTOLIN HFA) 108 (90 Base) MCG/ACT inhaler Inhale 2 puffs into the lungs every 6 (six) hours as needed for wheezing. 1 Inhaler 2  . amoxicillin (AMOXIL) 500 MG capsule take 4 capsules by mouth 30 TO 60 MINUTES PRIOR TO DENTAL APPOINTMENT. 4 capsule 12  . aspirin 81 MG tablet Take 81 mg by mouth at bedtime.     Marland Kitchen BIOTIN PO Take 1 tablet by mouth at bedtime.     . bisoprolol (ZEBETA) 5 MG tablet take 1/2 tablet by mouth once daily 15 tablet 11  . BREO ELLIPTA 200-25 MCG/INH AEPB inhale 1 puff INTO THE LUNGS daily 60 each 6  . Calcium Carbonate-Vitamin D (CALCIUM 600 + D PO) Take 600 mg by mouth at bedtime.     . Cholecalciferol (VITAMIN D)  2000 UNITS CAPS Take 2,000 Units by mouth at bedtime.     . fluocinonide (LIDEX) 0.05 % external solution apply 1 APPLICATION ON THE SCALP AS NEEDED FOR ITCHING  0  . furosemide (LASIX) 40 MG tablet Take one tablet by mouth daily 5 times a week. 25 tablet 11  . ipratropium-albuterol (DUONEB) 0.5-2.5 (3) MG/3ML SOLN Take 3 mLs by nebulization every 6 (six) hours as needed. (Patient taking differently: Take 3 mLs by nebulization every 6 (six) hours as needed (for wheezing or shortness of breath). ) 360 mL 0  . lovastatin (MEVACOR) 40 MG tablet take 1 tablet at bedtime 30 tablet 6  . Miconazole Nitrate 2 % POWD Apply 1 application topically daily as needed (for skin). Apply to stomach and under  breasts    . montelukast (SINGULAIR) 10 MG tablet take 1 tablet by mouth at bedtime 30 tablet 5  . Multiple Vitamins-Minerals (PRESERVISION AREDS 2 PO) Take 1 capsule by mouth daily.    . Na Sulfate-K Sulfate-Mg Sulf 17.5-3.13-1.6 GM/180ML SOLN Suprep (no substitutions)-TAKE AS DIRECTED. 354 mL 0  . pantoprazole (PROTONIX) 40 MG tablet take 1 tablet by mouth at bedtime 90 tablet 1  . polycarbophil (FIBERCON) 625 MG tablet Take 625 mg by mouth daily.    . polyethylene glycol (MIRALAX / GLYCOLAX) packet Take 17 g by mouth daily. (Patient taking differently: Take 17 g by mouth daily as needed. ) 14 each 0  . potassium chloride (K-DUR) 10 MEQ tablet take 2 tablets by mouth once daily 60 tablet 11  . solifenacin (VESICARE) 10 MG tablet Take 1 tablet (10 mg total) by mouth daily. 30 tablet 11  . SPIRIVA HANDIHALER 18 MCG inhalation capsule inhale the contents of one capsule in the handihaler once daily 30 capsule 5  . traMADol (ULTRAM) 50 MG tablet Take 1 tablet (50 mg total) by mouth every 6 (six) hours as needed for moderate pain. 30 tablet 0  . traZODone (DESYREL) 50 MG tablet Take 1 tablet (50 mg total) by mouth at bedtime. 30 tablet 11  . vitamin B-12 (CYANOCOBALAMIN) 500 MCG tablet Take 500 mcg by mouth daily.      No current facility-administered medications for this visit.     Allergies as of 06/07/2016 - Review Complete 06/07/2016  Allergen Reaction Noted  . Codeine Nausea Only   . Prednisone Hives   . Sulfonamide derivatives Nausea Only     Family History  Problem Relation Age of Onset  . Lung cancer Father   . Heart disease Mother   . Diabetes Mother   . Hypertension Mother   . Stroke Mother   . CAD Son 11  . Heart attack Son 61  . Heart attack Maternal Grandfather   . Colon cancer Neg Hx   . Esophageal cancer Neg Hx   . Stomach cancer Neg Hx   . Rectal cancer Neg Hx     Social History   Social History  . Marital status: Married    Spouse name: N/A  . Number of children: N/A  . Years of education: N/A   Occupational History  . retired    Social History Main Topics  . Smoking status: Former Smoker    Packs/day: 1.50    Years: 10.00    Types: Cigarettes    Quit date: 12/28/1974  . Smokeless tobacco: Never Used     Comment: quit smoking in 1975-01-11  . Alcohol use 0.0 oz/week     Comment: rarely wine  . Drug use: No  . Sexual activity: No   Other Topics Concern  . Not on file   Social History Narrative   Widow. Husband died 13-Mar-2015 MVA. 1 cat.    2 sons; 1 grandchild. One son died of MI 28   Married 1965/01/10   Retired-[employer], volunteers at [employer]   Activity: pulm rehab   Diet: some water, fruits/vegetables daily     Physical Exam: BP 128/70 (BP Location: Left Arm, Patient Position: Sitting, Cuff Size: Normal)   Pulse 84   Ht 5\' 6"  (1.676 m)   Wt 213 lb 6.4 oz (96.8 kg)   BMI 34.44 kg/m  Constitutional: generally well-appearing Psychiatric: alert and oriented x3 Abdomen: soft, nontender, nondistended, no obvious ascites, no peritoneal signs, normal bowel sounds No  peripheral edema noted in lower extremities  Assessment and plan: 70 y.o. female with Personal history of rectal cancer  She is due for rectal cancer, colon cancer screening high risk given her  personal history of rectal cancer. She has diastolic heart failure, lung issues, however she is not on oxygen and is actually doing very well in pulmonary rehabilitation. She lives alone and gets around her house very well and is only short of breath that she really exerts herself. I think she is safe, reasonable to proceed with repeat colonoscopy at her soonest convenience. Given her health concerns I do think it is safest that we do this at the hospital with anesthesia assistance.   Owens Loffler, MD [facility] 06/07/2016, 11:08 AM

## 2016-06-09 NOTE — Anesthesia Postprocedure Evaluation (Signed)
Anesthesia Post Note  Patient: [name]  Procedure(s) Performed: Procedure(s) (LRB): COLONOSCOPY WITH PROPOFOL (N/A)  Patient location during evaluation: PACU Anesthesia Type: MAC Level of consciousness: awake and alert Pain management: pain level controlled Vital Signs Assessment: post-procedure vital signs reviewed and stable Respiratory status: spontaneous breathing, nonlabored ventilation, respiratory function stable and patient connected to nasal cannula oxygen Cardiovascular status: stable and blood pressure returned to baseline Anesthetic complications: no    Last Vitals:  Vitals:   06/09/16 1111 06/09/16 1220  BP: (!) 112/52 (!) 116/57  Pulse: 64 66  Resp: (!) 9 17  Temp: 36.9 C     Last Pain:  Vitals:   06/09/16 1111  TempSrc: Oral                 Ahkeem Goede J

## 2016-06-09 NOTE — Interval H&P Note (Signed)
History and Physical Interval Note:  06/09/2016 11:42 AM  [name]  has presented today for surgery, with the diagnosis of personal history colon cancer  The various methods of treatment have been discussed with the patient and family. After consideration of risks, benefits and other options for treatment, the patient has consented to  Procedure(s): COLONOSCOPY WITH PROPOFOL (N/A) as a surgical intervention .  The patient's history has been reviewed, patient examined, no change in status, stable for surgery.  I have reviewed the patient's chart and labs.  Questions were answered to the patient's satisfaction.     [name]

## 2016-06-09 NOTE — Discharge Instructions (Signed)

## 2016-06-09 NOTE — Transfer of Care (Signed)
Immediate Anesthesia Transfer of Care Note  Patient: [name]  Procedure(s) Performed: Procedure(s): COLONOSCOPY WITH PROPOFOL (N/A)  Patient Location: PACU  Anesthesia Type:MAC  Level of Consciousness: awake, alert  and oriented  Airway & Oxygen Therapy: Patient Spontanous Breathing and Patient connected to nasal cannula oxygen  Post-op Assessment: Report given to RN and Post -op Vital signs reviewed and stable  Post vital signs: Reviewed and stable  Last Vitals:  Vitals:   06/09/16 1111  BP: (!) 112/52  Pulse: 64  Resp: (!) 9  Temp: 36.9 C    Last Pain:  Vitals:   06/09/16 1111  TempSrc: Oral         Complications: No apparent anesthesia complications

## 2016-06-09 NOTE — Anesthesia Preprocedure Evaluation (Addendum)
Anesthesia Evaluation  Patient identified by MRN, date of birth, ID band Patient awake    Reviewed: Allergy & Precautions, NPO status , Patient's Chart, lab work & pertinent test results  History of Anesthesia Complications (+) PONV and history of anesthetic complications  Airway Mallampati: II  TM Distance: >3 FB Neck ROM: Full    Dental no notable dental hx.    Pulmonary shortness of breath, asthma , sleep apnea , pneumonia, COPD, former smoker,    Pulmonary exam normal breath sounds clear to auscultation       Cardiovascular hypertension, + DOE  negative cardio ROS Normal cardiovascular exam Rhythm:Regular Rate:Normal     Neuro/Psych Seizures -,  PSYCHIATRIC DISORDERS  Neuromuscular disease    GI/Hepatic Neg liver ROS, GERD  ,  Endo/Other  negative endocrine ROS  Renal/GU Renal InsufficiencyRenal disease  negative genitourinary   Musculoskeletal  (+) Arthritis ,   Abdominal   Peds negative pediatric ROS (+)  Hematology  (+) anemia ,   Anesthesia Other Findings   Reproductive/Obstetrics negative OB ROS                             Anesthesia Physical Anesthesia Plan  ASA: III  Anesthesia Plan: MAC   Post-op Pain Management:    Induction: Intravenous  Airway Management Planned: Natural Airway  Additional Equipment:   Intra-op Plan:   Post-operative Plan:   Informed Consent: I have reviewed the patients History and Physical, chart, labs and discussed the procedure including the risks, benefits and alternatives for the proposed anesthesia with the patient or authorized representative who has indicated his/her understanding and acceptance.   Dental advisory given  Plan Discussed with: CRNA  Anesthesia Plan Comments:         Anesthesia Quick Evaluation

## 2016-06-10 ENCOUNTER — Encounter (HOSPITAL_COMMUNITY): Payer: Self-pay | Admitting: Gastroenterology

## 2016-06-14 ENCOUNTER — Encounter (HOSPITAL_COMMUNITY)
Admission: RE | Admit: 2016-06-14 | Discharge: 2016-06-14 | Disposition: A | Discharge status: Home / Self Care | Payer: Self-pay | Source: Ambulatory Visit | Attending: Internal Medicine | Admitting: Internal Medicine

## 2016-06-14 DIAGNOSIS — Z954 Presence of other heart-valve replacement: Secondary | ICD-10-CM | POA: Insufficient documentation

## 2016-06-14 DIAGNOSIS — R0600 Dyspnea, unspecified: Secondary | ICD-10-CM | POA: Insufficient documentation

## 2016-06-15 ENCOUNTER — Inpatient Hospital Stay (HOSPITAL_COMMUNITY)
Admission: EM | Admit: 2016-06-15 | Discharge: 2016-06-17 | DRG: 243 | Disposition: A | Discharge status: Home / Self Care | Payer: Medicare Other | Attending: Internal Medicine | Admitting: Internal Medicine

## 2016-06-15 ENCOUNTER — Emergency Department (HOSPITAL_COMMUNITY): Payer: Medicare Other

## 2016-06-15 ENCOUNTER — Encounter (HOSPITAL_COMMUNITY): Payer: Self-pay | Admitting: Emergency Medicine

## 2016-06-15 ENCOUNTER — Telehealth: Payer: Self-pay | Admitting: Family Medicine

## 2016-06-15 DIAGNOSIS — R001 Bradycardia, unspecified: Principal | ICD-10-CM | POA: Diagnosis present

## 2016-06-15 DIAGNOSIS — Z885 Allergy status to narcotic agent status: Secondary | ICD-10-CM | POA: Diagnosis not present

## 2016-06-15 DIAGNOSIS — N183 Chronic kidney disease, stage 3 (moderate): Secondary | ICD-10-CM | POA: Diagnosis present

## 2016-06-15 DIAGNOSIS — Z952 Presence of prosthetic heart valve: Secondary | ICD-10-CM | POA: Diagnosis not present

## 2016-06-15 DIAGNOSIS — K219 Gastro-esophageal reflux disease without esophagitis: Secondary | ICD-10-CM | POA: Diagnosis present

## 2016-06-15 DIAGNOSIS — E538 Deficiency of other specified B group vitamins: Secondary | ICD-10-CM | POA: Diagnosis present

## 2016-06-15 DIAGNOSIS — I5032 Chronic diastolic (congestive) heart failure: Secondary | ICD-10-CM | POA: Diagnosis present

## 2016-06-15 DIAGNOSIS — Z882 Allergy status to sulfonamides status: Secondary | ICD-10-CM

## 2016-06-15 DIAGNOSIS — I130 Hypertensive heart and chronic kidney disease with heart failure and stage 1 through stage 4 chronic kidney disease, or unspecified chronic kidney disease: Secondary | ICD-10-CM | POA: Diagnosis present

## 2016-06-15 DIAGNOSIS — E785 Hyperlipidemia, unspecified: Secondary | ICD-10-CM | POA: Diagnosis present

## 2016-06-15 DIAGNOSIS — Z923 Personal history of irradiation: Secondary | ICD-10-CM | POA: Diagnosis not present

## 2016-06-15 DIAGNOSIS — R911 Solitary pulmonary nodule: Secondary | ICD-10-CM | POA: Diagnosis present

## 2016-06-15 DIAGNOSIS — E669 Obesity, unspecified: Secondary | ICD-10-CM | POA: Diagnosis present

## 2016-06-15 DIAGNOSIS — J449 Chronic obstructive pulmonary disease, unspecified: Secondary | ICD-10-CM | POA: Diagnosis present

## 2016-06-15 DIAGNOSIS — Z79899 Other long term (current) drug therapy: Secondary | ICD-10-CM | POA: Diagnosis not present

## 2016-06-15 DIAGNOSIS — Z87891 Personal history of nicotine dependence: Secondary | ICD-10-CM | POA: Diagnosis not present

## 2016-06-15 DIAGNOSIS — F5104 Psychophysiologic insomnia: Secondary | ICD-10-CM | POA: Diagnosis present

## 2016-06-15 DIAGNOSIS — Z95818 Presence of other cardiac implants and grafts: Secondary | ICD-10-CM

## 2016-06-15 DIAGNOSIS — Z7951 Long term (current) use of inhaled steroids: Secondary | ICD-10-CM | POA: Diagnosis not present

## 2016-06-15 DIAGNOSIS — Z7982 Long term (current) use of aspirin: Secondary | ICD-10-CM

## 2016-06-15 DIAGNOSIS — Z9049 Acquired absence of other specified parts of digestive tract: Secondary | ICD-10-CM | POA: Diagnosis not present

## 2016-06-15 DIAGNOSIS — I441 Atrioventricular block, second degree: Secondary | ICD-10-CM | POA: Diagnosis present

## 2016-06-15 DIAGNOSIS — K5900 Constipation, unspecified: Secondary | ICD-10-CM | POA: Diagnosis present

## 2016-06-15 DIAGNOSIS — Z85048 Personal history of other malignant neoplasm of rectum, rectosigmoid junction, and anus: Secondary | ICD-10-CM

## 2016-06-15 DIAGNOSIS — I442 Atrioventricular block, complete: Secondary | ICD-10-CM | POA: Diagnosis not present

## 2016-06-15 DIAGNOSIS — Z8249 Family history of ischemic heart disease and other diseases of the circulatory system: Secondary | ICD-10-CM | POA: Diagnosis not present

## 2016-06-15 DIAGNOSIS — Z6834 Body mass index (BMI) 34.0-34.9, adult: Secondary | ICD-10-CM

## 2016-06-15 DIAGNOSIS — Z888 Allergy status to other drugs, medicaments and biological substances status: Secondary | ICD-10-CM

## 2016-06-15 LAB — I-STAT TROPONIN, ED: Troponin i, poc: 0.04 ng/mL (ref 0.00–0.08)

## 2016-06-15 LAB — COMPREHENSIVE METABOLIC PANEL
ALT: 20 U/L (ref 14–54)
AST: 27 U/L (ref 15–41)
Albumin: 3.8 g/dL (ref 3.5–5.0)
Alkaline Phosphatase: 77 U/L (ref 38–126)
Anion gap: 10 (ref 5–15)
BUN: 17 mg/dL (ref 6–20)
CO2: 25 mmol/L (ref 22–32)
Calcium: 9.7 mg/dL (ref 8.9–10.3)
Chloride: 106 mmol/L (ref 101–111)
Creatinine, Ser: 1.29 mg/dL — ABNORMAL HIGH (ref 0.44–1.00)
GFR calc Af Amer: 47 mL/min — ABNORMAL LOW (ref >60)
GFR calc non Af Amer: 41 mL/min — ABNORMAL LOW (ref >60)
Glucose, Bld: 98 mg/dL (ref 65–99)
Potassium: 3.6 mmol/L (ref 3.5–5.1)
Sodium: 141 mmol/L (ref 135–145)
Total Bilirubin: 0.8 mg/dL (ref 0.3–1.2)
Total Protein: 6.7 g/dL (ref 6.5–8.1)

## 2016-06-15 LAB — CBC WITH DIFFERENTIAL/PLATELET
Basophils Absolute: 0 10*3/uL (ref 0.0–0.1)
Basophils Relative: 0 %
Eosinophils Absolute: 0.2 10*3/uL (ref 0.0–0.7)
Eosinophils Relative: 2 %
HCT: 43.6 % (ref 36.0–46.0)
Hemoglobin: 13.4 g/dL (ref 12.0–15.0)
Lymphocytes Relative: 10 %
Lymphs Abs: 0.7 10*3/uL (ref 0.7–4.0)
MCH: 28.4 pg (ref 26.0–34.0)
MCHC: 30.7 g/dL (ref 30.0–36.0)
MCV: 92.4 fL (ref 78.0–100.0)
Monocytes Absolute: 0.7 10*3/uL (ref 0.1–1.0)
Monocytes Relative: 10 %
Neutro Abs: 5.5 10*3/uL (ref 1.7–7.7)
Neutrophils Relative %: 78 %
Platelets: 140 10*3/uL — ABNORMAL LOW (ref 150–400)
RBC: 4.72 MIL/uL (ref 3.87–5.11)
RDW: 14.9 % (ref 11.5–15.5)
WBC: 7.1 10*3/uL (ref 4.0–10.5)

## 2016-06-15 MED ORDER — TRAMADOL HCL 50 MG PO TABS
50.0000 mg | ORAL_TABLET | Freq: Four times a day (QID) | ORAL | Status: DC | PRN
  Administered 2016-06-16: 50 mg via ORAL
  Filled 2016-06-15: qty 1

## 2016-06-15 MED ORDER — SODIUM CHLORIDE 0.9 % IR SOLN
80.0000 mg | Status: AC
  Administered 2016-06-16: 80 mg

## 2016-06-15 MED ORDER — POTASSIUM CHLORIDE CRYS ER 20 MEQ PO TBCR
20.0000 meq | EXTENDED_RELEASE_TABLET | Freq: Every day | ORAL | Status: DC
  Administered 2016-06-17: 20 meq via ORAL
  Filled 2016-06-15: qty 2
  Filled 2016-06-15: qty 1

## 2016-06-15 MED ORDER — CEFAZOLIN SODIUM-DEXTROSE 2-4 GM/100ML-% IV SOLN
2.0000 g | INTRAVENOUS | Status: AC
  Administered 2016-06-16: 2 g via INTRAVENOUS

## 2016-06-15 MED ORDER — CALCIUM POLYCARBOPHIL 625 MG PO TABS
625.0000 mg | ORAL_TABLET | Freq: Every day | ORAL | Status: DC
  Filled 2016-06-15 (×3): qty 1

## 2016-06-15 MED ORDER — ASPIRIN EC 81 MG PO TBEC
81.0000 mg | DELAYED_RELEASE_TABLET | Freq: Every day | ORAL | Status: DC
  Administered 2016-06-15 – 2016-06-16 (×2): 81 mg via ORAL
  Filled 2016-06-15 (×2): qty 1

## 2016-06-15 MED ORDER — ALBUTEROL SULFATE (2.5 MG/3ML) 0.083% IN NEBU: 2.5000 mg | INHALATION_SOLUTION | Freq: Four times a day (QID) | RESPIRATORY_TRACT | Status: DC | PRN

## 2016-06-15 MED ORDER — POLYETHYLENE GLYCOL 3350 17 G PO PACK
17.0000 g | PACK | Freq: Every day | ORAL | Status: DC
  Filled 2016-06-15 (×2): qty 1

## 2016-06-15 MED ORDER — TRAZODONE HCL 50 MG PO TABS
50.0000 mg | ORAL_TABLET | Freq: Every day | ORAL | Status: DC
  Administered 2016-06-16: 50 mg via ORAL
  Filled 2016-06-15 (×2): qty 1

## 2016-06-15 MED ORDER — SODIUM CHLORIDE 0.9 % IV SOLN: 250.0000 mL | INTRAVENOUS | Status: DC | PRN

## 2016-06-15 MED ORDER — FUROSEMIDE 40 MG PO TABS
40.0000 mg | ORAL_TABLET | Freq: Every day | ORAL | Status: DC
  Filled 2016-06-15: qty 1

## 2016-06-15 MED ORDER — IPRATROPIUM-ALBUTEROL 0.5-2.5 (3) MG/3ML IN SOLN: 3.0000 mL | Freq: Four times a day (QID) | RESPIRATORY_TRACT | Status: DC | PRN

## 2016-06-15 MED ORDER — SODIUM CHLORIDE 0.9% FLUSH
3.0000 mL | Freq: Two times a day (BID) | INTRAVENOUS | Status: DC
  Administered 2016-06-16 – 2016-06-17 (×2): 3 mL via INTRAVENOUS

## 2016-06-15 MED ORDER — ONDANSETRON HCL 4 MG/2ML IJ SOLN: 4.0000 mg | Freq: Four times a day (QID) | INTRAMUSCULAR | Status: DC | PRN

## 2016-06-15 MED ORDER — PANTOPRAZOLE SODIUM 40 MG PO TBEC
40.0000 mg | DELAYED_RELEASE_TABLET | Freq: Every day | ORAL | Status: DC
  Administered 2016-06-15 – 2016-06-16 (×2): 40 mg via ORAL
  Filled 2016-06-15 (×2): qty 1

## 2016-06-15 MED ORDER — FLUTICASONE FUROATE-VILANTEROL 200-25 MCG/INH IN AEPB
1.0000 | INHALATION_SPRAY | Freq: Every day | RESPIRATORY_TRACT | Status: DC
  Administered 2016-06-17: 1 via RESPIRATORY_TRACT
  Filled 2016-06-15: qty 28

## 2016-06-15 MED ORDER — ACETAMINOPHEN 325 MG PO TABS: 650.0000 mg | ORAL_TABLET | ORAL | Status: DC | PRN

## 2016-06-15 MED ORDER — SODIUM CHLORIDE 0.9 % IV SOLN
INTRAVENOUS | Status: DC
  Administered 2016-06-16: 06:00:00 via INTRAVENOUS

## 2016-06-15 MED ORDER — ASPIRIN 81 MG PO TABS: 81.0000 mg | ORAL_TABLET | Freq: Every day | ORAL | Status: DC

## 2016-06-15 MED ORDER — CHLORHEXIDINE GLUCONATE 4 % EX LIQD
60.0000 mL | Freq: Once | CUTANEOUS | Status: DC
  Filled 2016-06-15: qty 60

## 2016-06-15 MED ORDER — TIOTROPIUM BROMIDE MONOHYDRATE 18 MCG IN CAPS
18.0000 ug | ORAL_CAPSULE | Freq: Every day | RESPIRATORY_TRACT | Status: DC
  Administered 2016-06-17: 18 ug via RESPIRATORY_TRACT
  Filled 2016-06-15: qty 5

## 2016-06-15 MED ORDER — CHLORHEXIDINE GLUCONATE 4 % EX LIQD
60.0000 mL | Freq: Once | CUTANEOUS | Status: AC
  Administered 2016-06-15: 4 via TOPICAL
  Filled 2016-06-15: qty 60

## 2016-06-15 MED ORDER — SODIUM CHLORIDE 0.9% FLUSH: 3.0000 mL | INTRAVENOUS | Status: DC | PRN

## 2016-06-15 MED ORDER — ALBUTEROL SULFATE HFA 108 (90 BASE) MCG/ACT IN AERS: 2.0000 | INHALATION_SPRAY | Freq: Four times a day (QID) | RESPIRATORY_TRACT | Status: DC | PRN

## 2016-06-15 MED ORDER — MONTELUKAST SODIUM 10 MG PO TABS
10.0000 mg | ORAL_TABLET | Freq: Every day | ORAL | Status: DC
  Administered 2016-06-15 – 2016-06-16 (×2): 10 mg via ORAL
  Filled 2016-06-15 (×2): qty 1

## 2016-06-15 NOTE — ED Triage Notes (Signed)
Pt here for SOB and dizziness; pt sts noted bradycardia at home

## 2016-06-15 NOTE — Telephone Encounter (Signed)
Noted pt at ER currently.

## 2016-06-15 NOTE — ED Provider Notes (Signed)
[facility] DEPT Provider Note   CSN: [id number] Arrival date & time: 06/15/16  0905     History   Chief Complaint Chief Complaint  Patient presents with  . Shortness of Breath  . Bradycardia    HPI [name] is a 70 y.o. female.  The history is provided by the patient.  Shortness of Breath  This is a recurrent problem. The problem occurs frequently.The current episode started more than 2 days ago. The problem has been gradually worsening. Associated symptoms include orthopnea. Pertinent negatives include no chest pain, no syncope and no leg swelling. It is unknown what precipitated the problem. She has tried nothing for the symptoms. The treatment provided no relief. She has had prior hospitalizations. She has had no prior ED visits. Associated medical issues include COPD and chronic lung disease. Associated medical issues do not include heart failure.    Past Medical History:  Diagnosis Date  . Acute cystitis without hematuria 12/22/2015  . Anemia   . Arthritis   . Asthma   . Cataracts, bilateral    immature  . Chronic diastolic heart failure ([place]) 09/19/2014  . Chronic insomnia   . Constipation    takes Miralax daily as needed  . COPD (chronic obstructive pulmonary disease) (HCC)    Albuterol inhaler daily as needed;Duoneb daily as needed;Spiriva daily  . DDD (degenerative disc disease)    cervical - kyphosis with mod DD changes C5/6 and C6/7; lumbar - early DD at L2/3 ([place])  . Essential hypertension    was on meds but after appointment Dec 11 with Dr.C he took her off  . GERD (gastroesophageal reflux disease)    takes Protonix daily  . History of radiation therapy 05/30/11 to 07/07/11   rectum  . History of shingles   . Hyperlipemia    takes Lovastatin daily  . Lung nodule    RLL nodule-7mm stable 2006, April 2009, and June 2009  . Obesity   . Osteopenia 12/2014   T -1.5 hip  . Pancreatitis 11/2014   ?zpack related vs gallstone pancreatitis with  abnormal HIDA scan pending cholecystectomy  . Rectal cancer ([place]) 04/2011   T3N0; s/p lap LAR, s/p ileostomy, s/p reversal, s/p chemo  . Rheumatic heart disease mitral stenosis    mod MS by echo 02/2014  . S/P AVR (aortic valve replacement) 2015   bioprosthetic ([place])  . S/P MVR (mitral valve replacement) 2015   bioprosthetic ([place])  . Seizures ([place])    febrile seizures as a child  . Severe aortic valve stenosis  AVR 3/15 10/01/2013   mild-mod by echo 02/2014  . Sleep apnea    PT TOLD BORDERLINE-TRIED CPAP-DID NOT HELP-SHE DOES NOT USE CPAP  . Small bowel obstruction, partial ([place]) 08/2013   reolved without NGT placement.   . Vitamin B12 deficiency 02/28/2015   Start B12 shots 02/2015     Patient Active Problem List   Diagnosis Date Noted  . Symptomatic bradycardia 06/15/2016  . Personal history of colonic polyps   . Personal history of rectal cancer   . Pedal edema 02/25/2016  . Chronic obstructive pulmonary disease, unspecified COPD, unspecified chronic bronchitis type 02/16/2016  . History of pneumonia 02/16/2016  . CAP (community acquired pneumonia) 02/08/2016  . Oral thrush 02/08/2016  . Skin rash 12/22/2015  . Advanced care planning/counseling discussion 10/28/2015  . CKD (chronic kidney disease) stage 3, GFR 30-59 ml/min 07/03/2015  . Dyspnea on exertion 06/05/2015  . Mixed stress and urge urinary incontinence  03/16/2015  . Vitamin B12 deficiency 02/28/2015  . Chronic insomnia 02/25/2015  . Chronic cholecystitis with calculus 01/21/2015  . COPD exacerbation ([place]) 12/11/2014  . Osteopenia 12/09/2014  . Medicare annual wellness visit, subsequent 10/15/2014  . Health maintenance examination 10/15/2014  . Adjustment disorder with depressed mood 10/15/2014  . Chronic diastolic heart failure ([place]) 09/19/2014  . Chest pain 08/25/2014  . 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 Pulmonary nodule 02/23/2012  . History of radiation therapy   .  Rectal cancer, midrectum ypT3ypN0, s/p neoadj chemoXRT, lap LAR with diverting loop ileostomy 08/2011, ileostomy takedown 03/2012 05/26/2011  . Essential hypertension, benign 03/24/2009  . Obesity, Class I, BMI 30-34.9 03/05/2008  . BACK PAIN 03/05/2008  . MERALGIA PARESTHETICA 12/13/2007  . HLD (hyperlipidemia) 05/03/2007  . Asthma 05/03/2007  . COPD (chronic obstructive pulmonary disease) ([place]) 05/03/2007    Past Surgical History:  Procedure Laterality Date  . AORTIC VALVE REPLACEMENT N/A 12/12/2013   Procedure: AORTIC VALVE REPLACEMENT (AVR);  Surgeon: Gaye Pollack, MD; Service: Open Heart Surgery  . BACK SURGERY  2006, 2007   2006 SPACER, 2007 decompression and fusion L4/5  . BOWEL RESECTION  04/02/2012   Procedure: SMALL BOWEL RESECTION;  Surgeon: Stark Klein, MD;  Location: [facility];  Service: General;  Laterality: N/A;  . CARPAL TUNNEL RELEASE Bilateral   . CHOLECYSTECTOMY N/A 01/21/2015   chronic cholecystitis, Stark Klein, MD  . COLON RESECTION  08/25/2011   Procedure: COLON RESECTION LAPAROSCOPIC;  Surgeon: Stark Klein, MD;  Location: [facility];  Service: General;  Laterality: N/A;  Laparoscopic Assisted Low Anterior Resection Diverting Ostomy and onQ pain pump  . COLONOSCOPY  03/2013   1 polyp, rpt 3 yrs Ardis Hughs)  . COLONOSCOPY WITH PROPOFOL N/A 06/09/2016   Procedure: COLONOSCOPY WITH PROPOFOL;  Surgeon: [name], MD;  Location: [facility];  Service: Endoscopy;  Laterality: N/A;  . FOOT SURGERY  left foot   hammer toe  . HAND SURGERY  Bil   carpal tunnel  . ILEOSTOMY  08/25/2011  . ILEOSTOMY CLOSURE  04/02/2012   Procedure: ILEOSTOMY TAKEDOWN;  Surgeon: Stark Klein, MD;  Location: [facility];  Service: General;  Laterality: N/A;  . INTRAOPERATIVE TRANSESOPHAGEAL ECHOCARDIOGRAM N/A 12/12/2013   Procedure: INTRAOPERATIVE TRANSESOPHAGEAL ECHOCARDIOGRAM;  Surgeon: Gaye Pollack, MD;  Location: [facility] OR;  Service: Open Heart Surgery;  Laterality: N/A;  . LEFT AND RIGHT HEART  CATHETERIZATION WITH CORONARY ANGIOGRAM N/A 11/27/2013   Procedure: LEFT AND RIGHT HEART CATHETERIZATION WITH CORONARY ANGIOGRAM;  Surgeon: Sanda Klein, MD;  Location: [facility] CATH LAB;  Service: Cardiovascular;  Laterality: N/A;  . MITRAL VALVE REPLACEMENT N/A 12/12/2013   Procedure: MITRAL VALVE (MV) REPLACEMENT OR REPAIR;  Surgeon: Gaye Pollack, MD; Service: Open Heart Surgery  . TEE WITHOUT CARDIOVERSION N/A 11/29/2013   Procedure: TRANSESOPHAGEAL ECHOCARDIOGRAM (TEE);  Surgeon: Sueanne Margarita, MD;  Location: [facility] ENDOSCOPY;  Service: Cardiovascular;  Laterality: N/A;  . TONSILLECTOMY      OB History    No data available       Home Medications    Prior to Admission medications   Medication Sig Start Date End Date Taking? Authorizing Provider  albuterol (PROVENTIL HFA;VENTOLIN HFA) 108 (90 Base) MCG/ACT inhaler Inhale 2 puffs into the lungs every 6 (six) hours as needed for wheezing. 01/18/16  Yes Brand Males, MD  amoxicillin (AMOXIL) 500 MG capsule take 4 capsules by mouth 30 TO 60 MINUTES PRIOR TO DENTAL APPOINTMENT. 09/17/15  Yes Mihai  Croitoru, MD  aspirin 81 MG tablet Take 81 mg by mouth at bedtime.    Yes Historical Provider, MD  BIOTIN PO Take 1 tablet by mouth at bedtime.    Yes Historical Provider, MD  bisoprolol (ZEBETA) 5 MG tablet take 1/2 tablet by mouth once daily 05/12/16  Yes Mihai Croitoru, MD  BREO ELLIPTA 200-25 MCG/INH AEPB inhale 1 puff INTO THE LUNGS daily 04/13/16  Yes Ria Bush, MD  Calcium Carbonate-Vitamin D (CALCIUM 600 + D PO) Take 600 mg by mouth at bedtime.    Yes Historical Provider, MD  Cholecalciferol (VITAMIN D) 2000 UNITS CAPS Take 2,000 Units by mouth at bedtime.    Yes Historical Provider, MD  ipratropium-albuterol (DUONEB) 0.5-2.5 (3) MG/3ML SOLN Take 3 mLs by nebulization every 6 (six) hours as needed. Patient taking differently: Take 3 mLs by nebulization every 6 (six) hours as needed (for wheezing or shortness of breath).  11/14/14  Yes Hendricks Limes, MD  lovastatin (MEVACOR) 40 MG tablet take 1 tablet at bedtime 10/13/15  Yes Ria Bush, MD  Miconazole Nitrate 2 % POWD Apply 1 application topically daily as needed (for skin). Apply to stomach and under breasts   Yes Historical Provider, MD  montelukast (SINGULAIR) 10 MG tablet take 1 tablet by mouth at bedtime 05/12/16  Yes Brand Males, MD  Multiple Vitamins-Minerals (PRESERVISION AREDS 2 PO) Take 1 capsule by mouth daily.   Yes Historical Provider, MD  pantoprazole (PROTONIX) 40 MG tablet take 1 tablet by mouth at bedtime 04/13/16  Yes Mihai Croitoru, MD  polycarbophil (FIBERCON) 625 MG tablet Take 625 mg by mouth daily.   Yes Historical Provider, MD  polyethylene glycol (MIRALAX / GLYCOLAX) packet Take 17 g by mouth daily. Patient taking differently: Take 17 g by mouth daily as needed.  12/02/13  Yes Brett Canales, PA-C  potassium chloride (K-DUR) 10 MEQ tablet take 2 tablets by mouth once daily 05/12/16  Yes Mihai Croitoru, MD  solifenacin (VESICARE) 10 MG tablet Take 1 tablet (10 mg total) by mouth daily. 10/28/15  Yes Ria Bush, MD  SPIRIVA HANDIHALER 18 MCG inhalation capsule inhale the contents of one capsule in the handihaler once daily 04/13/16  Yes Brand Males, MD  traMADol (ULTRAM) 50 MG tablet Take 1 tablet (50 mg total) by mouth every 6 (six) hours as needed for moderate pain. 10/28/15  Yes Ria Bush, MD  traZODone (DESYREL) 50 MG tablet Take 1 tablet (50 mg total) by mouth at bedtime. 10/28/15  Yes Ria Bush, MD  vitamin B-12 (CYANOCOBALAMIN) 500 MCG tablet Take 500 mcg by mouth daily.   Yes Historical Provider, MD  furosemide (LASIX) 40 MG tablet Take one tablet by mouth daily 5 times a week. 05/12/16   Mihai Croitoru, MD  Na Sulfate-K Sulfate-Mg Sulf 17.5-3.13-1.6 GM/180ML SOLN Suprep (no substitutions)-TAKE AS DIRECTED. Patient not taking: Reported on 06/15/2016 03/14/16   [name], MD    Family History Family History  Problem Relation Age of  Onset  . Lung cancer Father   . Heart disease Mother   . Diabetes Mother   . Hypertension Mother   . Stroke Mother   . CAD Son 30  . Heart attack Son 18  . Heart attack Maternal Grandfather   . Colon cancer Neg Hx   . Esophageal cancer Neg Hx   . Stomach cancer Neg Hx   . Rectal cancer Neg Hx     Social History Social History  Substance Use Topics  .  Smoking status: Former Smoker    Packs/day: 1.50    Years: 10.00    Types: Cigarettes    Quit date: 12/28/1974  . Smokeless tobacco: Never Used     Comment: quit smoking in 1976  . Alcohol use 0.0 oz/week     Comment: rarely wine     Allergies   Codeine; Prednisone; and Sulfonamide derivatives   Review of Systems Review of Systems  Constitutional: Positive for activity change and fatigue.  Respiratory: Positive for shortness of breath.   Cardiovascular: Positive for orthopnea. Negative for chest pain, leg swelling and syncope.  Neurological: Positive for light-headedness. Negative for syncope and weakness.  All other systems reviewed and are negative.    Physical Exam Updated Vital Signs BP (!) 145/57 (BP Location: Right Arm)   Pulse 64   Temp 98 F (36.7 C) (Oral)   Resp 16   SpO2 96%   Physical Exam  Constitutional: She is oriented to person, place, and time. She appears well-developed and well-nourished.  HENT:  Head: Normocephalic and atraumatic.  Eyes: Right eye exhibits no discharge.  Cardiovascular: Normal rate.   Murmur heard. Pulmonary/Chest: Effort normal and breath sounds normal. She has no wheezes. She has no rales.  Abdominal: Soft. She exhibits no distension. There is no tenderness.  Neurological: She is oriented to person, place, and time.  Skin: Skin is warm and dry. She is not diaphoretic.  Psychiatric: She has a normal mood and affect.  Nursing note and vitals reviewed.    ED Treatments / Results  Labs (all labs ordered are listed, but only abnormal results are displayed) Labs  Reviewed  COMPREHENSIVE METABOLIC PANEL - Abnormal; Notable for the following:       Result Value   Creatinine, Ser 1.29 (*)    GFR calc non Af Amer 41 (*)    GFR calc Af Amer 47 (*)    All other components within normal limits  CBC WITH DIFFERENTIAL/PLATELET - Abnormal; Notable for the following:    Platelets 140 (*)    All other components within normal limits  I-STAT TROPOININ, ED    EKG   EKG Interpretation  Date/Time:  Wednesday June 15 2016 09:10:50 EDT Ventricular Rate:  57 PR Interval:    QRS Duration: 162 QT Interval:  502 QTC Calculation: 488 R Axis:   136 Text Interpretation:  Undetermined rhythm Right bundle branch block Left posterior fascicular block Abnormal ECG AV block- type 2 Reconfirmed by Gerald Leitz ([id number]) on 06/15/2016 4:21:33 PM        Radiology Dg Chest Portable 1 View  Result Date: 06/15/2016 CLINICAL DATA:  Shortness of Breath EXAM: PORTABLE CHEST 1 VIEW COMPARISON:  March 17, 2016 FINDINGS: There is mild scarring in the lateral right base. The lungs elsewhere are clear. Heart is slightly enlarged with pulmonary vascularity within normal limits. Patient is status post median sternotomy. There is atherosclerotic calcification in aorta. No adenopathy. IMPRESSION: Mild scarring lateral right base. No edema or consolidation. Stable cardiac silhouette. There is aortic atherosclerosis. Electronically Signed   By: Lowella Grip III [name]   On: 06/15/2016 10:15    Procedures Procedures (including critical care time)  Medications Ordered in ED Medications  polyethylene glycol (MIRALAX / GLYCOLAX) packet 17 g (not administered)  ipratropium-albuterol (DUONEB) 0.5-2.5 (3) MG/3ML nebulizer solution 3 mL (not administered)  traMADol (ULTRAM) tablet 50 mg (not administered)  traZODone (DESYREL) tablet 50 mg (not administered)  polycarbophil (FIBERCON) tablet 625 mg (not administered)  pantoprazole (PROTONIX)  EC tablet 40 mg (not administered)    fluticasone furoate-vilanterol (BREO ELLIPTA) 200-25 MCG/INH 1 puff (not administered)  tiotropium (SPIRIVA) inhalation capsule 18 mcg (not administered)  furosemide (LASIX) tablet 40 mg (not administered)  potassium chloride (K-DUR) CR tablet 20 mEq (not administered)  montelukast (SINGULAIR) tablet 10 mg (not administered)  acetaminophen (TYLENOL) tablet 650 mg (not administered)  ondansetron (ZOFRAN) injection 4 mg (not administered)  sodium chloride flush (NS) 0.9 % injection 3 mL (not administered)  sodium chloride flush (NS) 0.9 % injection 3 mL (not administered)  0.9 %  sodium chloride infusion (not administered)  0.9 %  sodium chloride infusion (not administered)  gentamicin (GARAMYCIN) 80 mg in sodium chloride irrigation 0.9 % 500 mL irrigation (not administered)  chlorhexidine (HIBICLENS) 4 % liquid 4 application (not administered)  chlorhexidine (HIBICLENS) 4 % liquid 4 application (not administered)  ceFAZolin (ANCEF) IVPB 2g/100 mL premix (not administered)  albuterol (PROVENTIL) (2.5 MG/3ML) 0.083% nebulizer solution 2.5 mg (not administered)  aspirin EC tablet 81 mg (not administered)     Initial Impression / Assessment and Plan / ED Course  I have reviewed the triage vital signs and the nursing notes.  Pertinent labs & imaging results that were available during my care of the patient were reviewed by me and considered in my medical decision making (see chart for details).  Clinical Course   Jennie MYLEIGH WHITFILL is a 70 y.o. female with rheumatic valvular heart disease, COPD, morbid obesity, hyperlipidemia  AVR/MVR (bioprostheses).  Here today with intermittent shortness breath and lightheadedness. This happened over the course of 4 days. Often after exertion. Patient was noted to be bradycardic on arrival. Rythym strip shows that she appears to have evidence second-degree AV block type II. Potentially dangerous rhtyhm. EKG shows bi fascicular block. EKGs done 2 and rhythm  strip place in chart. Patient has no chest pain this time. Patient follows with Dr.Croichu and Dr. Melford Aase.   4:21 PM Cards called, pads placed.   Patient on bisoprolol 2.5 mg according to last cards note.   Cards admitted to EP.    CRITICAL CARE Performed by: Gardiner Sleeper Total critical care time: 30 minutes Critical care time was exclusive of separately billable procedures and treating other patients. Critical care was necessary to treat or prevent imminent or life-threatening deterioration. Critical care was time spent personally by me on the following activities: development of treatment plan with patient and/or surrogate as well as nursing, discussions with consultants, evaluation of patient's response to treatment, examination of patient, obtaining history from patient or surrogate, ordering and performing treatments and interventions, ordering and review of laboratory studies, ordering and review of radiographic studies, pulse oximetry and re-evaluation of patient's condition.    Final Clinical Impressions(s) / ED Diagnoses   Final diagnoses:  None    New Prescriptions Current Discharge Medication List       Courteney Julio Alm, MD 06/15/16 1622

## 2016-06-15 NOTE — ED Notes (Signed)
Pt placed on zoll pads and leads per Dr. Thomasene Lot verbal order. Pt will remain on leads. EKG strip captured from [facility]

## 2016-06-15 NOTE — Telephone Encounter (Signed)
[facility] Patient Name: [name] DOB: 11-Mar-1946 Initial Comment whenever she gets up to move around, she can feel her heart beat go down, this morning it was 48, and nausea, light headed Nurse Assessment Nurse: Dimas Chyle, RN, [facility] Filbert Date/Time (Eastern Time): 06/15/2016 8:13:13 AM Confirm and document reason for call. If symptomatic, describe symptoms. You must click the next button to save text entered. ---Whenever she gets up to move around, she can feel her heart beat go down, this morning it was 48, and nausea, light headed. Symptoms started over the weekend. Has the patient traveled out of the country within the last 30 days? ---No Does the patient have any new or worsening symptoms? ---Yes Will a triage be completed? ---Yes Related visit to physician within the last 2 weeks? ---No Does the PT have any chronic conditions? (i.e. diabetes, asthma, etc.) ---Yes List chronic conditions. ---Heart valve replacement in 2015 and heart failure. Is this a behavioral health or substance abuse call? ---No Guidelines Guideline Title Affirmed Question Affirmed Notes Heart Rate and Heartbeat Questions Dizziness, lightheadedness, or weakness Final Disposition User Go to ED Now Dimas Chyle, RN, [facility] - ED Disagree/Comply: Comply

## 2016-06-15 NOTE — H&P (Signed)
History & Physical    Patient ID: [name] MRN: [id number], DOB/AGE: Sep 16, 1946   Admit date: 06/15/2016   Primary Physician: Ria Bush, MD Primary Cardiologist: Dr. Sallyanne Kuster  Patient Profile    70 yo female with PMH of rheumatic valvular heart disease (AVR/MVR bioprostheses), COPD, morbid obesity, HLD who presented to the [facility] ED for dizziness and bradycardia for the past week.   Past Medical History    Past Medical History:  Diagnosis Date  . Acute cystitis without hematuria 12/22/2015  . Anemia   . Arthritis   . Asthma   . Cataracts, bilateral    immature  . Chronic diastolic heart failure ([place]) 09/19/2014  . Chronic insomnia   . Constipation    takes Miralax daily as needed  . COPD (chronic obstructive pulmonary disease) (HCC)    Albuterol inhaler daily as needed;Duoneb daily as needed;Spiriva daily  . DDD (degenerative disc disease)    cervical - kyphosis with mod DD changes C5/6 and C6/7; lumbar - early DD at L2/3 ([place])  . Essential hypertension    was on meds but after appointment Dec 11 with Dr.C he took her off  . GERD (gastroesophageal reflux disease)    takes Protonix daily  . History of radiation therapy 05/30/11 to 07/07/11   rectum  . History of shingles   . Hyperlipemia    takes Lovastatin daily  . Lung nodule    RLL nodule-52mm stable 2006, April 2009, and June 2009  . Obesity   . Osteopenia 12/2014   T -1.5 hip  . Pancreatitis 11/2014   ?zpack related vs gallstone pancreatitis with abnormal HIDA scan pending cholecystectomy  . Rectal cancer ([place]) 04/2011   T3N0; s/p lap LAR, s/p ileostomy, s/p reversal, s/p chemo  . Rheumatic heart disease mitral stenosis    mod [name] by echo 02/2014  . S/P AVR (aortic valve replacement) 2015   bioprosthetic ([place])  . S/P MVR (mitral valve replacement) 2015   bioprosthetic ([place])  . Seizures ([place])    febrile seizures as a child  . Severe aortic valve stenosis  AVR 3/15 10/01/2013   mild-mod by echo 02/2014  . Sleep apnea    PT TOLD BORDERLINE-TRIED CPAP-DID NOT HELP-SHE DOES NOT USE CPAP  . Small bowel obstruction, partial ([place]) 08/2013   reolved without NGT placement.   . Vitamin B12 deficiency 02/28/2015   Start B12 shots 02/2015     Past Surgical History:  Procedure Laterality Date  . AORTIC VALVE REPLACEMENT N/A 12/12/2013   Procedure: AORTIC VALVE REPLACEMENT (AVR);  Surgeon: Gaye Pollack, MD; Service: Open Heart Surgery  . BACK SURGERY  2006, 2007   2006 SPACER, 2007 decompression and fusion L4/5  . BOWEL RESECTION  04/02/2012   Procedure: SMALL BOWEL RESECTION;  Surgeon: Stark Alyss Granato, MD;  Location: [facility];  Service: General;  Laterality: N/A;  . CARPAL TUNNEL RELEASE Bilateral   . CHOLECYSTECTOMY N/A 01/21/2015   chronic cholecystitis, Stark Jushua Waltman, MD  . COLON RESECTION  08/25/2011   Procedure: COLON RESECTION LAPAROSCOPIC;  Surgeon: Stark Louvina Cleary, MD;  Location: [facility];  Service: General;  Laterality: N/A;  Laparoscopic Assisted Low Anterior Resection Diverting Ostomy and onQ pain pump  . COLONOSCOPY  03/2013   1 polyp, rpt 3 yrs Ardis Hughs)  . COLONOSCOPY WITH PROPOFOL N/A 06/09/2016   Procedure: COLONOSCOPY WITH PROPOFOL;  Surgeon: [name], MD;  Location: [facility];  Service: Endoscopy;  Laterality: N/A;  . FOOT SURGERY  left foot  hammer toe  . HAND SURGERY  Bil   carpal tunnel  . ILEOSTOMY  08/25/2011  . ILEOSTOMY CLOSURE  04/02/2012   Procedure: ILEOSTOMY TAKEDOWN;  Surgeon: Stark Isamu Trammel, MD;  Location: [facility];  Service: General;  Laterality: N/A;  . INTRAOPERATIVE TRANSESOPHAGEAL ECHOCARDIOGRAM N/A 12/12/2013   Procedure: INTRAOPERATIVE TRANSESOPHAGEAL ECHOCARDIOGRAM;  Surgeon: Gaye Pollack, MD;  Location: [place] OR;  Service: Open Heart Surgery;  Laterality: N/A;  . LEFT AND RIGHT HEART CATHETERIZATION WITH CORONARY ANGIOGRAM N/A 11/27/2013   Procedure: LEFT AND RIGHT HEART CATHETERIZATION WITH CORONARY ANGIOGRAM;  Surgeon: Sanda Marin Wisner, MD;   Location: [facility] CATH LAB;  Service: Cardiovascular;  Laterality: N/A;  . MITRAL VALVE REPLACEMENT N/A 12/12/2013   Procedure: MITRAL VALVE (MV) REPLACEMENT OR REPAIR;  Surgeon: Gaye Pollack, MD; Service: Open Heart Surgery  . TEE WITHOUT CARDIOVERSION N/A 11/29/2013   Procedure: TRANSESOPHAGEAL ECHOCARDIOGRAM (TEE);  Surgeon: Sueanne Margarita, MD;  Location: [facility] ENDOSCOPY;  Service: Cardiovascular;  Laterality: N/A;  . TONSILLECTOMY       Allergies  Allergies  Allergen Reactions  . Codeine Nausea Only  . Prednisone Hives    In different places all over the body.  . Sulfonamide Derivatives Nausea Only    History of Present Illness    [name], [name] Is a 70 year old female patient with past medical history of rheumatic valvular heart disease (AVR/MVR bioprostheses), COPD, morbid obesity, HLD who is followed by Dr. Sallyanne Kuster. She was started on bisoprolol earlier this year related to worsening shortness of breath, and elevated heart rate. She was last seen in the office on 12/07/15 where she reported improved breathing after being started on this medication. Plan at that visit was 2 continue her beta blocker, with no up titration as her blood pressure was relatively low. Her aortic valve gradients were noted to be normal, but mean gradient across mitral valve was elevated with plan to continue beta blockers to prolong diastolic filling time. There was concern that her weight was playing a substantial role in her continued dyspnea. Weight loss was encouraged. Her last 2-D echo on 09/18/15 showed EF of 65-70%, no wall motion abnormality aortic valve with peak gradient of 20 mmHg, and mitral valve with peak gradient of 11 mmHg.  She reports being in her usual state of health up until about a week ago. States she began to experience dizziness, and lightheadedness with position changes. Also began to notice some progressive dyspnea on exertion, stating when she would walk to the mailbox she would become short of  breath. She felt this may be related to her history of COPD, so she began checking her oxygen saturations and noted that her heart rate with her pulse ox was in the mid 40s on multiple occasions. This morning she attempted to obtain a visit with her PCP but after talking with the nurse on the phone was instructed to come to the ER for further evaluation.  In the ED her labs showed normal electrolytes, creatinine of 1.29, POC troponin 0.04, and stable hemoglobin. Chest x-ray showed no edema or consolidation. EKG showed SR with known right bundle-branch block, left posterior vesicular block, and new 2nd degree AVB. Her initial heart rate in the ED was noted to be in the 40s, and she was placed on Zoll pads. Heart rate has since improved but continues to show the degree AV block. She denies any chest pain, palpitations, syncope or lower extremity edema. Reports her last dose of bisoprolol was 2 days ago.  Home Medications    Prior to Admission medications   Medication Sig Start Date End Date Taking? Authorizing Provider  albuterol (PROVENTIL HFA;VENTOLIN HFA) 108 (90 Base) MCG/ACT inhaler Inhale 2 puffs into the lungs every 6 (six) hours as needed for wheezing. 01/18/16  Yes Brand Males, MD  amoxicillin (AMOXIL) 500 MG capsule take 4 capsules by mouth 30 TO 60 MINUTES PRIOR TO DENTAL APPOINTMENT. 09/17/15  Yes Mihai Croitoru, MD  aspirin 81 MG tablet Take 81 mg by mouth at bedtime.    Yes Historical Provider, MD  BIOTIN PO Take 1 tablet by mouth at bedtime.    Yes Historical Provider, MD  bisoprolol (ZEBETA) 5 MG tablet take 1/2 tablet by mouth once daily 05/12/16  Yes Mihai Croitoru, MD  BREO ELLIPTA 200-25 MCG/INH AEPB inhale 1 puff INTO THE LUNGS daily 04/13/16  Yes Ria Bush, MD  Calcium Carbonate-Vitamin D (CALCIUM 600 + D PO) Take 600 mg by mouth at bedtime.    Yes Historical Provider, MD  Cholecalciferol (VITAMIN D) 2000 UNITS CAPS Take 2,000 Units by mouth at bedtime.    Yes Historical  Provider, MD  ipratropium-albuterol (DUONEB) 0.5-2.5 (3) MG/3ML SOLN Take 3 mLs by nebulization every 6 (six) hours as needed. Patient taking differently: Take 3 mLs by nebulization every 6 (six) hours as needed (for wheezing or shortness of breath).  11/14/14  Yes Hendricks Limes, MD  lovastatin (MEVACOR) 40 MG tablet take 1 tablet at bedtime 10/13/15  Yes Ria Bush, MD  Miconazole Nitrate 2 % POWD Apply 1 application topically daily as needed (for skin). Apply to stomach and under breasts   Yes Historical Provider, MD  montelukast (SINGULAIR) 10 MG tablet take 1 tablet by mouth at bedtime 05/12/16  Yes Brand Males, MD  Multiple Vitamins-Minerals (PRESERVISION AREDS 2 PO) Take 1 capsule by mouth daily.   Yes Historical Provider, MD  pantoprazole (PROTONIX) 40 MG tablet take 1 tablet by mouth at bedtime 04/13/16  Yes Mihai Croitoru, MD  polycarbophil (FIBERCON) 625 MG tablet Take 625 mg by mouth daily.   Yes Historical Provider, MD  polyethylene glycol (MIRALAX / GLYCOLAX) packet Take 17 g by mouth daily. Patient taking differently: Take 17 g by mouth daily as needed.  12/02/13  Yes Brett Canales, PA-C  potassium chloride (K-DUR) 10 MEQ tablet take 2 tablets by mouth once daily 05/12/16  Yes Mihai Croitoru, MD  solifenacin (VESICARE) 10 MG tablet Take 1 tablet (10 mg total) by mouth daily. 10/28/15  Yes Ria Bush, MD  SPIRIVA HANDIHALER 18 MCG inhalation capsule inhale the contents of one capsule in the handihaler once daily 04/13/16  Yes Brand Males, MD  traMADol (ULTRAM) 50 MG tablet Take 1 tablet (50 mg total) by mouth every 6 (six) hours as needed for moderate pain. 10/28/15  Yes Ria Bush, MD  traZODone (DESYREL) 50 MG tablet Take 1 tablet (50 mg total) by mouth at bedtime. 10/28/15  Yes Ria Bush, MD  vitamin B-12 (CYANOCOBALAMIN) 500 MCG tablet Take 500 mcg by mouth daily.   Yes Historical Provider, MD  furosemide (LASIX) 40 MG tablet Take one tablet by mouth daily 5  times a week. 05/12/16   Mihai Croitoru, MD  Na Sulfate-K Sulfate-Mg Sulf 17.5-3.13-1.6 GM/180ML SOLN Suprep (no substitutions)-TAKE AS DIRECTED. Patient not taking: Reported on 06/15/2016 03/14/16   [name], MD    Family History    Family History  Problem Relation Age of Onset  . Lung cancer Father   . Heart  disease Mother   . Diabetes Mother   . Hypertension Mother   . Stroke Mother   . CAD Son 31  . Heart attack Son 60  . Heart attack Maternal Grandfather   . Colon cancer Neg Hx   . Esophageal cancer Neg Hx   . Stomach cancer Neg Hx   . Rectal cancer Neg Hx     Social History    Social History   Social History  . Marital status: Married    Spouse name: N/A  . Number of children: N/A  . Years of education: N/A   Occupational History  . retired    Social History Main Topics  . Smoking status: Former Smoker    Packs/day: 1.50    Years: 10.00    Types: Cigarettes    Quit date: 12/28/1974  . Smokeless tobacco: Never Used     Comment: quit smoking in 01-26-75  . Alcohol use 0.0 oz/week     Comment: rarely wine  . Drug use: No  . Sexual activity: No   Other Topics Concern  . Not on file   Social History Narrative   Widow. Husband died 03-28-15 MVA. 1 cat.    2 sons; 1 grandchild. One son died of MI 39   Married 01/25/65   Retired-[employer], volunteers at [employer]   Activity: pulm rehab   Diet: some water, fruits/vegetables daily     Review of Systems    General:  No chills, fever, night sweats or weight changes.  Cardiovascular:  See HPI Dermatological: No rash, lesions/masses Respiratory: No cough, dyspnea Urologic: No hematuria, dysuria Abdominal:   No nausea, vomiting, diarrhea, bright red blood per rectum, melena, or hematemesis Neurologic:  No visual changes, wkns, changes in mental status. All other systems reviewed and are otherwise negative except as noted above.  Physical Exam    Blood pressure 121/55, pulse 64, temperature 98.3 F (36.8 C), temperature  source Oral, resp. rate 17, SpO2 97 %.  General: Pleasant Older moderately obese Caucasian female, NAD Psych: Normal affect. Neuro: Alert and oriented X 3. Moves all extremities spontaneously. HEENT: Normal  Neck: Supple without bruits or JVD. Lungs:  Resp regular and unlabored, CTA. Heart: RRR no s3, s4, 2/6 systolic murmur. Abdomen: Soft, non-tender, non-distended, BS + x 4.  Extremities: No clubbing, cyanosis or edema. DP/PT/Radials 2+ and equal bilaterally.  Labs    Troponin [facility] Test)  Recent Labs  06/15/16 0950  TROPIPOC 0.04   No results for input(s): CKTOTAL, CKMB, TROPONINI in the last 72 hours. Lab Results  Component Value Date   WBC 7.1 06/15/2016   HGB 13.4 06/15/2016   HCT 43.6 06/15/2016   MCV 92.4 06/15/2016   PLT 140 (L) 06/15/2016     Recent Labs Lab 06/15/16 0945  NA 141  K 3.6  CL 106  CO2 25  BUN 17  CREATININE 1.29*  CALCIUM 9.7  PROT 6.7  BILITOT 0.8  ALKPHOS 77  ALT 20  AST 27  GLUCOSE 98   Lab Results  Component Value Date   CHOL 154 10/23/2015   HDL 49.70 10/23/2015   LDLCALC 91 10/23/2015   TRIG 62.0 10/23/2015   Lab Results  Component Value Date   DDIMER 2.65 (H) 10/10/2013     Radiology Studies    Dg Chest Portable 1 View  Result Date: 06/15/2016 CLINICAL DATA:  Shortness of Breath EXAM: PORTABLE CHEST 1 VIEW COMPARISON:  March 17, 2016 FINDINGS: There is mild scarring in  the lateral right base. The lungs elsewhere are clear. Heart is slightly enlarged with pulmonary vascularity within normal limits. Patient is status post median sternotomy. There is atherosclerotic calcification in aorta. No adenopathy. IMPRESSION: Mild scarring lateral right base. No edema or consolidation. Stable cardiac silhouette. There is aortic atherosclerosis. Electronically Signed   By: Lowella Grip III [name]   On: 06/15/2016 10:15    ECG & Cardiac Imaging    EKG: RBBB, LPFB with new 2 degree AVB  Echo:  09/18/15  ------------------------------------------------------------------- Study Conclusions  - Left ventricle: The cavity size was normal. There was mild focal   basal hypertrophy of the septum. Systolic function was vigorous.   The estimated ejection fraction was in the range of 65% to 70%.   There was dynamic obstruction. Peak velocity - 4.35m/s peak   velocity with valsalva, peak gradient 48mmHg. Wall motion was   normal; there were no regional wall motion abnormalities. - Aortic valve: A bioprosthesis was present and functioning   normally. Transvalvular velocity was minimally increased. There   was no stenosis. Peak velocity (S): 224 cm/s. Peak gradient (S):   20 mm Hg. - Mitral valve: Severely calcified annulus. A bioprosthesis was   present and functioning normally. The findings are consistent   with mild to moderate stenosis. Mean gradient (D): 7 mm Hg. Peak   gradient (D): 11 mm Hg. Valve area by pressure half-time: 1.76   cm^2. - Left atrium: The atrium was moderately dilated.  Impressions:  - Compared to the prior study, there has been no significant   interval change.  Assessment & Plan    70 yo female with PMH of rheumatic valvular heart disease (AVR/MVR bioprostheses), COPD, morbid obesity, HLD who presented to the [facility] ED for dizziness and bradycardia for the past week.   1. 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 Risks, benefits reviewed with patient who wishes to proceed.  2.  Valvular heart disease Update echo as above  3.  HTN Stable No change required today  4.  Obesity weight loss encouraged   Signed, Chanetta Marshall, NP 06/15/2016 2:44 PM  Pt seen and interviewed and examined  addended as above  She notes that as she exercised her heart rate went down. This supports the concern of an for Hisian block.  It  is not surprising with bivalvular replacement that heart block as ensued as specimens are underlying right bundle branch block  The benefits and risks were reviewed including but not limited to death,  perforation, infection, lead dislodgement and device malfunction.  The patient understands agrees and is willing to proceed.

## 2016-06-16 ENCOUNTER — Encounter (HOSPITAL_COMMUNITY): Payer: Self-pay | Admitting: Cardiology

## 2016-06-16 ENCOUNTER — Encounter (HOSPITAL_COMMUNITY): Payer: Self-pay

## 2016-06-16 ENCOUNTER — Other Ambulatory Visit (HOSPITAL_COMMUNITY): Payer: Medicare Other

## 2016-06-16 ENCOUNTER — Encounter (HOSPITAL_COMMUNITY)
Admission: EM | Disposition: A | Discharge status: Home / Self Care | Payer: Self-pay | Source: Home / Self Care | Attending: Internal Medicine

## 2016-06-16 DIAGNOSIS — I442 Atrioventricular block, complete: Secondary | ICD-10-CM

## 2016-06-16 HISTORY — PX: EP IMPLANTABLE DEVICE: SHX172B

## 2016-06-16 LAB — SURGICAL PCR SCREEN
MRSA, PCR: NEGATIVE
Staphylococcus aureus: NEGATIVE

## 2016-06-16 LAB — CBC
HCT: 37 % (ref 36.0–46.0)
Hemoglobin: 11.1 g/dL — ABNORMAL LOW (ref 12.0–15.0)
MCH: 27.8 pg (ref 26.0–34.0)
MCHC: 30 g/dL (ref 30.0–36.0)
MCV: 92.5 fL (ref 78.0–100.0)
Platelets: 121 10*3/uL — ABNORMAL LOW (ref 150–400)
RBC: 4 MIL/uL (ref 3.87–5.11)
RDW: 14.9 % (ref 11.5–15.5)
WBC: 4.9 10*3/uL (ref 4.0–10.5)

## 2016-06-16 LAB — BASIC METABOLIC PANEL
Anion gap: 7 (ref 5–15)
BUN: 15 mg/dL (ref 6–20)
CO2: 28 mmol/L (ref 22–32)
Calcium: 9.1 mg/dL (ref 8.9–10.3)
Chloride: 104 mmol/L (ref 101–111)
Creatinine, Ser: 1.05 mg/dL — ABNORMAL HIGH (ref 0.44–1.00)
GFR calc Af Amer: >60 mL/min (ref >60)
GFR calc non Af Amer: 53 mL/min — ABNORMAL LOW (ref >60)
Glucose, Bld: 96 mg/dL (ref 65–99)
Potassium: 3.5 mmol/L (ref 3.5–5.1)
Sodium: 139 mmol/L (ref 135–145)

## 2016-06-16 SURGERY — PACEMAKER IMPLANT
Anesthesia: LOCAL

## 2016-06-16 MED ORDER — FENTANYL CITRATE (PF) 100 MCG/2ML IJ SOLN
INTRAMUSCULAR | Status: AC
  Filled 2016-06-16: qty 2

## 2016-06-16 MED ORDER — FENTANYL CITRATE (PF) 100 MCG/2ML IJ SOLN
INTRAMUSCULAR | Status: DC | PRN
  Administered 2016-06-16 (×2): 25 ug via INTRAVENOUS

## 2016-06-16 MED ORDER — CEFAZOLIN IN D5W 1 GM/50ML IV SOLN
1.0000 g | Freq: Four times a day (QID) | INTRAVENOUS | Status: AC
  Administered 2016-06-16 – 2016-06-17 (×3): 1 g via INTRAVENOUS
  Filled 2016-06-16 (×3): qty 50

## 2016-06-16 MED ORDER — MIDAZOLAM HCL 5 MG/5ML IJ SOLN
INTRAMUSCULAR | Status: AC
  Filled 2016-06-16: qty 5

## 2016-06-16 MED ORDER — ACETAMINOPHEN 325 MG PO TABS
325.0000 mg | ORAL_TABLET | ORAL | Status: DC | PRN
  Administered 2016-06-16: 650 mg via ORAL
  Filled 2016-06-16: qty 2

## 2016-06-16 MED ORDER — LIDOCAINE HCL (PF) 1 % IJ SOLN
INTRAMUSCULAR | Status: AC
  Filled 2016-06-16: qty 30

## 2016-06-16 MED ORDER — HEPARIN (PORCINE) IN NACL 2-0.9 UNIT/ML-% IJ SOLN
INTRAMUSCULAR | Status: AC
  Filled 2016-06-16: qty 1000

## 2016-06-16 MED ORDER — HEPARIN (PORCINE) IN NACL 2-0.9 UNIT/ML-% IJ SOLN
INTRAMUSCULAR | Status: DC | PRN
  Administered 2016-06-16: 09:00:00

## 2016-06-16 MED ORDER — LIDOCAINE HCL (PF) 1 % IJ SOLN
INTRAMUSCULAR | Status: DC | PRN
  Administered 2016-06-16: 47 mL via INTRADERMAL

## 2016-06-16 MED ORDER — SODIUM CHLORIDE 0.9 % IR SOLN
Status: AC
  Filled 2016-06-16: qty 2

## 2016-06-16 MED ORDER — ONDANSETRON HCL 4 MG/2ML IJ SOLN: 4.0000 mg | Freq: Four times a day (QID) | INTRAMUSCULAR | Status: DC | PRN

## 2016-06-16 MED ORDER — CEFAZOLIN SODIUM-DEXTROSE 2-4 GM/100ML-% IV SOLN
INTRAVENOUS | Status: AC
  Filled 2016-06-16: qty 100

## 2016-06-16 MED ORDER — MIDAZOLAM HCL 5 MG/5ML IJ SOLN
INTRAMUSCULAR | Status: DC | PRN
  Administered 2016-06-16 (×2): 1 mg via INTRAVENOUS

## 2016-06-16 SURGICAL SUPPLY — 7 items
CABLE SURGICAL S-101-97-12 (CABLE) ×2 IMPLANT
LEAD TENDRIL MRI 52CM LPA1200M (Lead) ×2 IMPLANT
LEAD TENDRIL MRI 58CM LPA1200M (Lead) ×2 IMPLANT
PACEMAKER ASSURITY DR-RF (Pacemaker) ×2 IMPLANT
PAD DEFIB LIFELINK (PAD) ×2 IMPLANT
SHEATH CLASSIC 8F (SHEATH) ×4 IMPLANT
TRAY PACEMAKER INSERTION (PACKS) ×2 IMPLANT

## 2016-06-16 NOTE — Discharge Summary (Signed)
ELECTROPHYSIOLOGY PROCEDURE DISCHARGE SUMMARY    Patient ID: [name],  MRN: [id number], DOB/AGE: 1946/05/09 70 y.o.  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  Hypertension 2.  Rheumatic valvular heart disease s/p AVR and MVR 3.  Obesity 4.  COPD 5. Hyperlipidemia  Allergies  Allergen Reactions  . Codeine Nausea Only  . Prednisone Hives    In different places all over the body.  . Sulfonamide Derivatives Nausea Only     Procedures This Admission:  1.  Implantation of a STJ dual chamber PPM on 06/16/16 by Dr Curt Bears.  The patient received a STJ model number Assurity PPM. There were no immediate post procedure complications. 2.  CXR on 06/17/16 demonstrated no pneumothorax status post device implantation.   Brief HPI/Hospital Course:  [name] is a 70 y.o. female with a past medical history as outlined above. She presented to the ER for evaluation of progressive exercise intolerance and shortness of breath and was found to be in 2:1 heart block. She had been off BB therapy for 48 hours.   The patient has had symptomatic bradycardia without reversible causes identified.  Risks, benefits, and alternatives to PPM implantation were reviewed with the patient who wished to proceed. The patient underwent implantation of a STJ MRI compatible dual chamber pacemaker with details as outlined above.  She  was monitored on telemetry overnight which demonstrated sinus rhythm with ventricular pacing.  Left chest was without hematoma or ecchymosis.  The device was interrogated and found to be functioning normally.  CXR was obtained and demonstrated no pneumothorax status post device implantation.  Wound  care, arm mobility, and restrictions were reviewed with the patient.  The patient was examined and considered stable for discharge to home.    Physical Exam: Vitals:   06/16/16 1550 06/16/16 1554 06/16/16 2132 06/17/16 0733  BP:  130/61 (!) 146/61 127/64  Pulse:  62 (!) 58 65  Resp:      Temp:  97.8 F (36.6 C) 97.7 F (36.5 C) 98.2 F (36.8 C)  TempSrc:  Oral Oral Oral  SpO2: 99% 95% 97% (!) 65%  Weight:    213 lb 12.8 oz (97 kg)    GEN- The patient is obese appearing, alert and oriented x 3 today.   HEENT: normocephalic, atraumatic; sclera clear, conjunctiva pink; hearing intact; oropharynx clear; neck supple  Lungs- Clear to ausculation bilaterally, normal work of breathing.  No wheezes, rales, rhonchi Heart- Regular rate and rhythm (paced) GI- soft, non-tender, non-distended, bowel sounds present  Extremities- no clubbing, cyanosis, or edema  MS- no significant deformity or atrophy Skin- warm and dry, no rash or lesion, left chest without hematoma/ecchymosis Psych- euthymic mood, full affect Neuro- strength and sensation are intact   Labs:   Lab Results  Component Value Date   WBC 4.9 06/16/2016   HGB 11.1 (L) 06/16/2016   HCT 37.0 06/16/2016   MCV 92.5 06/16/2016   PLT 121 (L) 06/16/2016     Recent Labs Lab 06/15/16 0945 06/16/16 0315  NA 141 139  K 3.6 3.5  CL 106 104  CO2 25 28  BUN 17 15  CREATININE 1.29* 1.05*  CALCIUM 9.7 9.1  PROT 6.7  --   BILITOT 0.8  --   ALKPHOS 77  --  ALT 20  --   AST 27  --   GLUCOSE 98 96    Discharge Medications:    Medication List    TAKE these medications   albuterol 108 (90 Base) MCG/ACT inhaler Commonly known as:  PROVENTIL HFA;VENTOLIN HFA Inhale 2 puffs into the lungs every 6 (six) hours as needed for wheezing.   amoxicillin 500 MG capsule Commonly known as:  AMOXIL take 4 capsules by mouth 30 TO 60 MINUTES PRIOR TO DENTAL APPOINTMENT.   aspirin 81 MG tablet Take 81 mg by mouth at bedtime.   BIOTIN  PO Take 1 tablet by mouth at bedtime.   bisoprolol 5 MG tablet Commonly known as:  ZEBETA take 1/2 tablet by mouth once daily   BREO ELLIPTA 200-25 MCG/INH Aepb Generic drug:  fluticasone furoate-vilanterol inhale 1 puff INTO THE LUNGS daily   CALCIUM 600 + D PO Take 600 mg by mouth at bedtime.   furosemide 40 MG tablet Commonly known as:  LASIX Take one tablet by mouth daily 5 times a week.   ipratropium-albuterol 0.5-2.5 (3) MG/3ML Soln Commonly known as:  DUONEB Take 3 mLs by nebulization every 6 (six) hours as needed. What changed:  reasons to take this   lovastatin 40 MG tablet Commonly known as:  MEVACOR take 1 tablet at bedtime   Miconazole Nitrate 2 % Powd Apply 1 application topically daily as needed (for skin). Apply to stomach and under breasts   montelukast 10 MG tablet Commonly known as:  SINGULAIR take 1 tablet by mouth at bedtime   Na Sulfate-K Sulfate-Mg Sulf 17.5-3.13-1.6 GM/180ML Soln Suprep (no substitutions)-TAKE AS DIRECTED.   pantoprazole 40 MG tablet Commonly known as:  PROTONIX take 1 tablet by mouth at bedtime   polycarbophil 625 MG tablet Commonly known as:  FIBERCON Take 625 mg by mouth daily.   polyethylene glycol packet Commonly known as:  MIRALAX / GLYCOLAX Take 17 g by mouth daily. What changed:  when to take this  reasons to take this   potassium chloride 10 MEQ tablet Commonly known as:  K-DUR take 2 tablets by mouth once daily   PRESERVISION AREDS 2 PO Take 1 capsule by mouth daily.   solifenacin 10 MG tablet Commonly known as:  VESICARE Take 1 tablet (10 mg total) by mouth daily.   SPIRIVA HANDIHALER 18 MCG inhalation capsule Generic drug:  tiotropium inhale the contents of one capsule in the handihaler once daily   traMADol 50 MG tablet Commonly known as:  ULTRAM Take 1 tablet (50 mg total) by mouth every 6 (six) hours as needed for moderate pain.   traZODone 50 MG tablet Commonly known as:  DESYREL Take 1  tablet (50 mg total) by mouth at bedtime.   vitamin B-12 500 MCG tablet Commonly known as:  CYANOCOBALAMIN Take 500 mcg by mouth daily.   Vitamin D 2000 units Caps Take 2,000 Units by mouth at bedtime.       Disposition:  Discharge Instructions    Diet - low sodium heart healthy    Complete by:  As directed   Increase activity slowly    Complete by:  As directed     Follow-up Information    [place] [facility] Follow up on 06/27/2016.   Specialty:  Cardiology Why:  at [facility] for wound check  Contact information: [address] [place] [phone]       Cordella Nyquist Meredith Leeds, MD Follow up on 09/19/2016.  Specialty:  Cardiology Why:  at 10:45AM Contact information: [place] [address] [phone]           Duration of Discharge Encounter: Greater than 30 minutes including physician time.  Signed, Chanetta Marshall, NP 06/17/2016 7:57 AM  I have seen and examined this patient with Chanetta Marshall.  Agree with above, note added to reflect my findings.  On exam, regular rhythm, no murmurs, lungs clear.  Had dual chamber pacemaker placed for heart block.  CXR and interrogation without issues.  Plan for discharge today with follow up in device clinic in 10 days.    Annella Prowell M. Savana Spina MD 06/17/2016 10:50 AM

## 2016-06-16 NOTE — Progress Notes (Signed)
Pt c/o left arm pain, does appear to be small amount of swelling in upper arm. Chanetta Marshall NP notified and assessed pt. Ultram ordered to be given for pain. No other new ordered. Will cont to monitor. Carroll Kinds RN

## 2016-06-16 NOTE — Progress Notes (Signed)
SUBJECTIVE: The patient is doing well today.  At this time, she denies chest pain, shortness of breath, or any new concerns.  Marland Kitchen aspirin EC  81 mg Oral QHS  .  ceFAZolin (ANCEF) IV  2 g Intravenous On Call  . chlorhexidine  60 mL Topical Once  . fluticasone furoate-vilanterol  1 puff Inhalation Daily  . furosemide  40 mg Oral Daily  . gentamicin irrigation  80 mg Irrigation On Call  . montelukast  10 mg Oral QHS  . pantoprazole  40 mg Oral QHS  . polycarbophil  625 mg Oral Daily  . polyethylene glycol  17 g Oral Daily  . potassium chloride SA  20 mEq Oral Daily  . sodium chloride flush  3 mL Intravenous Q12H  . tiotropium  18 mcg Inhalation Daily  . traZODone  50 mg Oral QHS   . sodium chloride 50 mL/hr at 06/16/16 0550    OBJECTIVE: Physical Exam: Vitals:   06/15/16 1400 06/15/16 1442 06/15/16 2127 06/16/16 0532  BP: 121/55 (!) 145/57 (!) 128/43 (!) 126/51  Pulse: 64 64 (!) 55 (!) 58  Resp: 17 16 18 20   Temp:  98 F (36.7 C) 97.7 F (36.5 C) 98 F (36.7 C)  TempSrc:  Oral Axillary Oral  SpO2: 97% 96% 97% 96%  Weight:    212 lb 8 oz (96.4 kg)    Intake/Output Summary (Last 24 hours) at 06/16/16 0717 Last data filed at 06/15/16 1845  Gross per 24 hour  Intake              240 ml  Output                0 ml  Net              240 ml    Telemetry reveals sinus rhythm with intermittent mobitz II block  GEN- The patient is well appearing, alert and oriented x 3 today.   Head- normocephalic, atraumatic Eyes-  Sclera clear, conjunctiva pink Ears- hearing intact Oropharynx- clear Neck- supple, no JVP Lymph- no cervical lymphadenopathy Lungs- Clear to ausculation bilaterally, normal work of breathing Heart- Regular rate and rhythm, no murmurs, rubs or gallops, PMI not laterally displaced GI- soft, NT, ND, + BS Extremities- no clubbing, cyanosis, or edema Skin- no rash or lesion Psych- euthymic mood, full affect Neuro- strength and sensation are  intact  LABS: Basic Metabolic Panel:  Recent Labs  06/15/16 0945 06/16/16 0315  NA 141 139  K 3.6 3.5  CL 106 104  CO2 25 28  GLUCOSE 98 96  BUN 17 15  CREATININE 1.29* 1.05*  CALCIUM 9.7 9.1   Liver Function Tests:  Recent Labs  06/15/16 0945  AST 27  ALT 20  ALKPHOS 77  BILITOT 0.8  PROT 6.7  ALBUMIN 3.8   No results for input(s): LIPASE, AMYLASE in the last 72 hours. CBC:  Recent Labs  06/15/16 0945 06/16/16 0315  WBC 7.1 4.9  NEUTROABS 5.5  --   HGB 13.4 11.1*  HCT 43.6 37.0  MCV 92.4 92.5  PLT 140* 121*   Cardiac Enzymes: No results for input(s): CKTOTAL, CKMB, CKMBINDEX, TROPONINI in the last 72 hours. BNP: Invalid input(s): POCBNP D-Dimer: No results for input(s): DDIMER in the last 72 hours. Hemoglobin A1C: No results for input(s): HGBA1C in the last 72 hours. Fasting Lipid Panel: No results for input(s): CHOL, HDL, LDLCALC, TRIG, CHOLHDL, LDLDIRECT in the last 72 hours. Thyroid Function Tests: No  results for input(s): TSH, T4TOTAL, T3FREE, THYROIDAB in the last 72 hours.  Invalid input(s): FREET3 Anemia Panel: No results for input(s): VITAMINB12, FOLATE, FERRITIN, TIBC, IRON, RETICCTPCT in the last 72 hours.  RADIOLOGY: Dg Chest Portable 1 View  Result Date: 06/15/2016 CLINICAL DATA:  Shortness of Breath EXAM: PORTABLE CHEST 1 VIEW COMPARISON:  March 17, 2016 FINDINGS: There is mild scarring in the lateral right base. The lungs elsewhere are clear. Heart is slightly enlarged with pulmonary vascularity within normal limits. Patient is status post median sternotomy. There is atherosclerotic calcification in aorta. No adenopathy. IMPRESSION: Mild scarring lateral right base. No edema or consolidation. Stable cardiac silhouette. There is aortic atherosclerosis. Electronically Signed   By: Lowella Grip III [name]   On: 06/15/2016 10:15    ASSESSMENT AND PLAN:  Active Problems:   Symptomatic bradycardia 1. Symptomatic sinus and 2:1 heart block:   Has been off Bisoprolol for last 2 days No other reversible causes identified Plan for pacemaker today.  Risks and benefits of the procedure discussed.  Risks include bleeding, infection, tamponade, pneumothorax.  The patient understands these risks and has agreed to the procedure.  2.  Valvular heart disease Update echo as above  3.  HTN Stable No change required today  4.  Obesity weight loss encouraged   Chas Axel Meredith Leeds, MD 06/16/2016 7:17 AM

## 2016-06-17 ENCOUNTER — Inpatient Hospital Stay (HOSPITAL_COMMUNITY): Payer: Medicare Other

## 2016-06-17 ENCOUNTER — Other Ambulatory Visit (HOSPITAL_COMMUNITY): Payer: Medicare Other

## 2016-06-17 MED ORDER — BISOPROLOL FUMARATE 5 MG PO TABS: 5.0000 mg | ORAL_TABLET | Freq: Every day | ORAL | Status: DC

## 2016-06-17 NOTE — Discharge Instructions (Signed)
Supplemental Discharge Instructions for  Pacemaker/Defibrillator Patients  Activity No heavy lifting or vigorous activity with your left/right arm for 6 to 8 weeks.  Do not raise your left/right arm above your head for one week.  Gradually raise your affected arm as drawn below.           __      06/20/16                       06/21/16                   06/22/16                        06/23/16  NO DRIVING for 1 week    ; you may begin driving on  [id number]   .  WOUND CARE - Keep the wound area clean and dry.  Do not get this area wet for one week. No showers for one week; you may shower on   06/23/16  . - The tape/steri-strips on your wound will fall off; do not pull them off.  No bandage is needed on the site.  DO  NOT apply any creams, oils, or ointments to the wound area. - If you notice any drainage or discharge from the wound, any swelling or bruising at the site, or you develop a fever > 101? F after you are discharged home, call the office at once.  Special Instructions - You are still able to use cellular telephones; use the ear opposite the side where you have your pacemaker/defibrillator.  Avoid carrying your cellular phone near your device. - When traveling through airports, show security personnel your identification card to avoid being screened in the metal detectors.  Ask the security personnel to use the hand wand. - Avoid arc welding equipment, MRI testing (magnetic resonance imaging), TENS units (transcutaneous nerve stimulators).  Call the office for questions about other devices. - Avoid electrical appliances that are in poor condition or are not properly grounded. - Microwave ovens are safe to be near or to operate.    Heart-Healthy Eating Plan Many factors influence your heart health, including eating and exercise habits. Heart (coronary) risk increases with abnormal blood fat (lipid) levels. Heart-healthy meal planning includes limiting unhealthy fats, increasing  healthy fats, and making other small dietary changes. This includes maintaining a healthy body weight to help keep lipid levels within a normal range. WHAT IS MY PLAN?  Your health care provider recommends that you:  Get no more than _________% of the total calories in your daily diet from fat.  Limit your intake of saturated fat to less than _________% of your total calories each day.  Limit the amount of cholesterol in your diet to less than _________ mg per day. WHAT TYPES OF FAT SHOULD I CHOOSE?  Choose healthy fats more often. Choose monounsaturated and polyunsaturated fats, such as olive oil and canola oil, flaxseeds, walnuts, almonds, and seeds.  Eat more omega-3 fats. Good choices include salmon, mackerel, sardines, tuna, flaxseed oil, and ground flaxseeds. Aim to eat fish at least two times each week.  Limit saturated fats. Saturated fats are primarily found in animal products, such as meats, butter, and cream. Plant sources of saturated fats include palm oil, palm kernel oil, and coconut oil.  Avoid foods with partially hydrogenated oils in them. These contain trans fats. Examples of foods that contain trans fats are stick  margarine, some tub margarines, cookies, crackers, and other baked goods. WHAT GENERAL GUIDELINES DO I NEED TO FOLLOW?  Check food labels carefully to identify foods with trans fats or high amounts of saturated fat.  Fill one half of your plate with vegetables and green salads. Eat 4-5 servings of vegetables per day. A serving of vegetables equals 1 cup of raw leafy vegetables,  cup of raw or cooked cut-up vegetables, or  cup of vegetable juice.  Fill one fourth of your plate with whole grains. Look for the word "whole" as the first word in the ingredient list.  Fill one fourth of your plate with lean protein foods.  Eat 4-5 servings of fruit per day. A serving of fruit equals one medium whole fruit,  cup of dried fruit,  cup of fresh, frozen, or canned  fruit, or  cup of 100% fruit juice.  Eat more foods that contain soluble fiber. Examples of foods that contain this type of fiber are apples, broccoli, carrots, beans, peas, and barley. Aim to get 20-30 g of fiber per day.  Eat more home-cooked food and less restaurant, buffet, and fast food.  Limit or avoid alcohol.  Limit foods that are high in starch and sugar.  Avoid fried foods.  Cook foods by using methods other than frying. Baking, boiling, grilling, and broiling are all great options. Other fat-reducing suggestions include:  Removing the skin from poultry.  Removing all visible fats from meats.  Skimming the fat off of stews, soups, and gravies before serving them.  Steaming vegetables in water or broth.  Lose weight if you are overweight. Losing just 5-10% of your initial body weight can help your overall health and prevent diseases such as diabetes and heart disease.  Increase your consumption of nuts, legumes, and seeds to 4-5 servings per week. One serving of dried beans or legumes equals  cup after being cooked, one serving of nuts equals 1 ounces, and one serving of seeds equals  ounce or 1 tablespoon.  You may need to monitor your salt (sodium) intake, especially if you have high blood pressure. Talk with your health care provider or dietitian to get more information about reducing sodium. WHAT FOODS CAN I EAT? Grains Breads, including [place], white, pita, wheat, raisin, rye, oatmeal, and [place]. Tortillas that are neither fried nor made with lard or trans fat. Low-fat rolls, including hotdog and hamburger buns and English muffins. Biscuits. Muffins. Waffles. Pancakes. Light popcorn. Whole-grain cereals. Flatbread. Melba toast. Pretzels. Breadsticks. Rusks. Low-fat snacks and crackers, including oyster, saltine, matzo, graham, animal, and rye. Rice and pasta, including brown rice and those that are made with whole wheat. Vegetables All vegetables. Fruits All  fruits, but limit coconut. Meats and Other Protein Sources Lean, well-trimmed beef, veal, pork, and lamb. Chicken and [place] without skin. All fish and shellfish. Wild duck, rabbit, pheasant, and venison. Egg whites or low-cholesterol egg substitutes. Dried beans, peas, lentils, and tofu.Seeds and most nuts. Dairy Low-fat or nonfat cheeses, including ricotta, string, and mozzarella. Skim or 1% milk that is liquid, powdered, or evaporated. Buttermilk that is made with low-fat milk. Nonfat or low-fat yogurt. Beverages Mineral water. Diet carbonated beverages. Sweets and Desserts Sherbets and fruit ices. Honey, jam, marmalade, jelly, and syrups. Meringues and gelatins. Pure sugar candy, such as hard candy, jelly beans, gumdrops, mints, marshmallows, and small amounts of dark chocolate. [employer] Eat all sweets and desserts in moderation. Fats and Oils Nonhydrogenated (trans-free) margarines. Vegetable oils, including soybean, sesame,  sunflower, olive, peanut, safflower, corn, canola, and cottonseed. Salad dressings or mayonnaise that are made with a vegetable oil. Limit added fats and oils that you use for cooking, baking, salads, and as spreads. Other Cocoa powder. Coffee and tea. All seasonings and condiments. The items listed above may not be a complete list of recommended foods or beverages. Contact your dietitian for more options. WHAT FOODS ARE NOT RECOMMENDED? Grains Breads that are made with saturated or trans fats, oils, or whole milk. Croissants. Butter rolls. Cheese breads. Sweet rolls. Donuts. Buttered popcorn. Chow mein noodles. High-fat crackers, such as cheese or butter crackers. Meats and Other Protein Sources Fatty meats, such as hotdogs, short ribs, sausage, spareribs, bacon, ribeye roast or steak, and mutton. High-fat deli meats, such as salami and bologna. Caviar. Domestic duck and goose. Organ meats, such as kidney, liver, sweetbreads, brains, gizzard, chitterlings, and  heart. Dairy Cream, sour cream, cream cheese, and creamed cottage cheese. Whole milk cheeses, including blue (bleu), Monte[place] Jack, [place], [place], [place], [place], [place], [place], [place], and [place]. Whole or 2% milk that is liquid, evaporated, or condensed. Whole buttermilk. Cream sauce or high-fat cheese sauce. Yogurt that is made from whole milk. Beverages Regular sodas and drinks with added sugar. Sweets and Desserts Frosting. Pudding. Cookies. Cakes other than angel food cake. Candy that has milk chocolate or white chocolate, hydrogenated fat, butter, coconut, or unknown ingredients. Buttered syrups. Full-fat ice cream or ice cream drinks. Fats and Oils Gravy that has suet, meat fat, or shortening. Cocoa butter, hydrogenated oils, palm oil, coconut oil, palm kernel oil. These can often be found in baked products, candy, fried foods, nondairy creamers, and whipped toppings. Solid fats and shortenings, including bacon fat, salt pork, lard, and butter. Nondairy cream substitutes, such as coffee creamers and sour cream substitutes. Salad dressings that are made of unknown oils, cheese, or sour cream. The items listed above may not be a complete list of foods and beverages to avoid. Contact your dietitian for more information.   This information is not intended to replace advice given to you by your health care provider. Make sure you discuss any questions you have with your health care provider.   Document Released: 07/05/2008 Document Revised: 10/17/2014 Document Reviewed: 03/20/2014 Elsevier Interactive Patient Education [employer].

## 2016-06-17 NOTE — Care Management Important Message (Signed)
Important Message  Patient Details  Name: [name] MRN: [id number] Date of Birth: 04-25-1946   Medicare Important Message Given:  Yes    Nathen May 06/17/2016, 11:35 AM

## 2016-06-20 ENCOUNTER — Ambulatory Visit: Payer: Medicare Other | Admitting: Internal Medicine

## 2016-06-21 ENCOUNTER — Encounter (HOSPITAL_COMMUNITY): Payer: Self-pay

## 2016-06-23 ENCOUNTER — Encounter (HOSPITAL_COMMUNITY): Payer: Self-pay

## 2016-06-27 ENCOUNTER — Ambulatory Visit (INDEPENDENT_AMBULATORY_CARE_PROVIDER_SITE_OTHER): Payer: Medicare Other | Admitting: *Deleted

## 2016-06-27 DIAGNOSIS — R001 Bradycardia, unspecified: Secondary | ICD-10-CM | POA: Diagnosis not present

## 2016-06-27 DIAGNOSIS — Z950 Presence of cardiac pacemaker: Secondary | ICD-10-CM | POA: Diagnosis not present

## 2016-06-27 LAB — CUP PACEART INCLINIC DEVICE CHECK
Battery Remaining Longevity: 128.4
Battery Voltage: 3.08 V
Brady Statistic RA Percent Paced: 51 %
Brady Statistic RV Percent Paced: 96 %
Date Time Interrogation Session: 20170918175133
Implantable Lead Implant Date: 20170907
Implantable Lead Implant Date: 20170907
Implantable Lead Location: 753859
Implantable Lead Location: 753860
Lead Channel Impedance Value: 537.5 Ohm
Lead Channel Impedance Value: 700 Ohm
Lead Channel Pacing Threshold Amplitude: 0.5 V
Lead Channel Pacing Threshold Amplitude: 0.625 V
Lead Channel Pacing Threshold Pulse Width: 0.5 ms
Lead Channel Pacing Threshold Pulse Width: 0.5 ms
Lead Channel Sensing Intrinsic Amplitude: 12 mV
Lead Channel Sensing Intrinsic Amplitude: 4.1 mV
Lead Channel Setting Pacing Amplitude: 0.75 V
Lead Channel Setting Pacing Amplitude: 1.625
Lead Channel Setting Pacing Pulse Width: 0.5 ms
Lead Channel Setting Sensing Sensitivity: 2 mV
Pulse Gen Model: 2272
Pulse Gen Serial Number: 7945283

## 2016-06-27 NOTE — Progress Notes (Signed)
Wound check appointment, device checked by industry. Steri-strips removed. Wound without redness or edema. Incision edges approximated, wound well healed. Normal device function. Thresholds, sensing, and impedances consistent with implant measurements. Device programmed with auto capture on for extra safety margin until 3 month visit (ACap Confirm turned on today). Histogram distribution slightly blunted for level of activity, sensor turned on per industry recommendations, slope programmed at 8. No mode switches or high ventricular rates noted. Patient educated about wound care, arm mobility, lifting restrictions. Merlin monitor set-up in office today. ROV with WC on 09/19/16.

## 2016-06-28 ENCOUNTER — Encounter (HOSPITAL_COMMUNITY): Payer: Self-pay

## 2016-06-29 ENCOUNTER — Ambulatory Visit (HOSPITAL_COMMUNITY): Payer: Medicare Other | Attending: Cardiology

## 2016-06-29 ENCOUNTER — Other Ambulatory Visit: Payer: Self-pay

## 2016-06-29 ENCOUNTER — Other Ambulatory Visit: Payer: Self-pay | Admitting: *Deleted

## 2016-06-29 DIAGNOSIS — E785 Hyperlipidemia, unspecified: Secondary | ICD-10-CM | POA: Diagnosis not present

## 2016-06-29 DIAGNOSIS — I091 Rheumatic diseases of endocardium, valve unspecified: Secondary | ICD-10-CM | POA: Diagnosis not present

## 2016-06-29 DIAGNOSIS — Z953 Presence of xenogenic heart valve: Secondary | ICD-10-CM | POA: Diagnosis not present

## 2016-06-29 DIAGNOSIS — J449 Chronic obstructive pulmonary disease, unspecified: Secondary | ICD-10-CM | POA: Diagnosis not present

## 2016-06-29 DIAGNOSIS — I119 Hypertensive heart disease without heart failure: Secondary | ICD-10-CM | POA: Insufficient documentation

## 2016-06-29 DIAGNOSIS — I059 Rheumatic mitral valve disease, unspecified: Secondary | ICD-10-CM | POA: Diagnosis not present

## 2016-06-30 ENCOUNTER — Encounter (HOSPITAL_COMMUNITY): Payer: Self-pay

## 2016-07-05 ENCOUNTER — Encounter (HOSPITAL_COMMUNITY)
Admission: RE | Admit: 2016-07-05 | Discharge: 2016-07-05 | Disposition: A | Discharge status: Home / Self Care | Payer: Self-pay | Source: Ambulatory Visit | Attending: Internal Medicine | Admitting: Internal Medicine

## 2016-07-07 ENCOUNTER — Encounter (HOSPITAL_COMMUNITY)
Admission: RE | Admit: 2016-07-07 | Discharge: 2016-07-07 | Disposition: A | Discharge status: Home / Self Care | Payer: Self-pay | Source: Ambulatory Visit | Attending: Internal Medicine | Admitting: Internal Medicine

## 2016-07-12 ENCOUNTER — Encounter (HOSPITAL_COMMUNITY)
Admission: RE | Admit: 2016-07-12 | Discharge: 2016-07-12 | Disposition: A | Discharge status: Home / Self Care | Payer: Self-pay | Source: Ambulatory Visit | Attending: Internal Medicine | Admitting: Internal Medicine

## 2016-07-12 DIAGNOSIS — Z954 Presence of other heart-valve replacement: Secondary | ICD-10-CM | POA: Insufficient documentation

## 2016-07-12 DIAGNOSIS — R0600 Dyspnea, unspecified: Secondary | ICD-10-CM | POA: Insufficient documentation

## 2016-07-13 ENCOUNTER — Telehealth: Payer: Self-pay | Admitting: Cardiology

## 2016-07-13 NOTE — Telephone Encounter (Signed)
Pt called and stated that she is a little more short of breath than usual. She stated that she was feeling better prior to her wound check on 06-27-16 but now she can barley walk to the mail box and back without being out of breath. She wants to know if this is related to the change that was made on 06-27-16. Please call pt back.

## 2016-07-13 NOTE — Telephone Encounter (Signed)
Returned patient call and made an appt for the DC 10/5 to re-program slope.

## 2016-07-14 ENCOUNTER — Encounter (HOSPITAL_COMMUNITY)
Admission: RE | Admit: 2016-07-14 | Discharge: 2016-07-14 | Disposition: A | Discharge status: Home / Self Care | Payer: Self-pay | Source: Ambulatory Visit | Attending: Internal Medicine | Admitting: Internal Medicine

## 2016-07-14 ENCOUNTER — Ambulatory Visit (INDEPENDENT_AMBULATORY_CARE_PROVIDER_SITE_OTHER): Payer: Medicare Other | Admitting: *Deleted

## 2016-07-14 DIAGNOSIS — R001 Bradycardia, unspecified: Secondary | ICD-10-CM

## 2016-07-14 LAB — CUP PACEART INCLINIC DEVICE CHECK
Battery Remaining Longevity: 121.2
Battery Voltage: 3.04 V
Brady Statistic RA Percent Paced: 63 %
Brady Statistic RV Percent Paced: 84 %
Date Time Interrogation Session: 20171005093142
Implantable Lead Implant Date: 20170907
Implantable Lead Implant Date: 20170907
Implantable Lead Location: 753859
Implantable Lead Location: 753860
Lead Channel Impedance Value: 487.5 Ohm
Lead Channel Impedance Value: 662.5 Ohm
Lead Channel Pacing Threshold Amplitude: 0.625 V
Lead Channel Pacing Threshold Amplitude: 0.75 V
Lead Channel Pacing Threshold Pulse Width: 0.5 ms
Lead Channel Pacing Threshold Pulse Width: 0.5 ms
Lead Channel Sensing Intrinsic Amplitude: 12 mV
Lead Channel Sensing Intrinsic Amplitude: 5 mV
Lead Channel Setting Pacing Amplitude: 1 V
Lead Channel Setting Pacing Amplitude: 1.625
Lead Channel Setting Pacing Pulse Width: 0.5 ms
Lead Channel Setting Sensing Sensitivity: 2 mV
Pulse Gen Model: 2272
Pulse Gen Serial Number: 7945283

## 2016-07-14 NOTE — Progress Notes (Signed)
Patient presents to the office for rate response adjustment only (N/C). Slope increased from 8->10. Patient to follow up with WC as scheduled.  Patient was encouraged to follow up with primary cardiologist if she doesn't notice an improvement in sx's (ShOB). Patient voiced understanding.

## 2016-07-18 ENCOUNTER — Encounter: Payer: Self-pay | Admitting: Internal Medicine

## 2016-07-18 ENCOUNTER — Ambulatory Visit (INDEPENDENT_AMBULATORY_CARE_PROVIDER_SITE_OTHER): Payer: Medicare Other | Admitting: Internal Medicine

## 2016-07-18 VITALS — BP 146/88 | HR 65 | Ht 66.0 in | Wt 210.0 lb

## 2016-07-18 DIAGNOSIS — J441 Chronic obstructive pulmonary disease with (acute) exacerbation: Secondary | ICD-10-CM

## 2016-07-18 MED ORDER — DOXYCYCLINE HYCLATE 100 MG PO TABS: ORAL_TABLET | ORAL | 0 refills | Status: DC

## 2016-07-18 MED ORDER — PREDNISONE 10 MG PO TABS: ORAL_TABLET | ORAL | 0 refills | Status: DC

## 2016-07-18 NOTE — Progress Notes (Signed)
Subjective:     Patient ID: [name], female   DOB: Mar 16, 1946, 70 y.o.   MRN: [id number]  HPI  OV 02/16/2016  Chief Complaint  Patient presents with  . Follow-up    Pt states she had pna 2 weeks ago and was given abx and pred by PCP. Pt states she just completed the pred. Pt states her SOB is not back to baseline yet. Pt c/o non prod cough with chest congestion. Pt denies CP/tightness.     Follow-up severe COPD  Last seen 11/16/2015. On 01/31/2016 she went to the emergency department for confirmed hypoxemia at home. Diagnosed base of chest x-ray and symptoms with community by pneumonia and COPD exacerbation. I review the chart visualized the x-ray and do support the findings of the ER doctor. Discharged on antibiotics and prednisone. Her curb 65 score was 2 and she opted for patient. She then followed up with primary care physician 02/08/2016 and due to persistent wheezing was given a diagnosis of COPD exacerbation and prednisone taper was extended. Currently she is feeling better but deconditioned. She is getting close to baseline she wants to go back to pulmonary rehabilitation. There are no other new issues. She is asking for handicapped sticker for her car   [place] 03/17/2016  Chief Complaint  Patient presents with  . Follow-up    review CXR.  pt notes stable SOB with exertion.     FU severe copd and recent pneumonia. Feels back to baseline. Compliant with inhalers. Has baseline dyspnea. CXR visiualized and agre with resport. Improved She reports need for colonoscopy under sedation. Chart review shows no dlco in  Years and last spirometry in 2 years. She is interested in copd research trials. Smoking hx - qiuit 1976; 15ppd smoker till then  Dg Chest 2 View  03/17/2016  CLINICAL DATA:  Right pleural effusion, shortness of breath EXAM: CHEST  2 VIEW COMPARISON:  01/31/2016 FINDINGS: Chronic small right pleural effusion. Associated right basilar opacity, likely scarring/atelectasis. No  focal consolidation. No pleural effusion or pneumothorax. Mild cardiomegaly.  Prostatic valves. Degenerative changes of the visualized thoracolumbar spine. Median sternotomy. IMPRESSION: Chronic small right pleural effusion. Associated right basilar opacity, likely scarring/ atelectasis. No evidence of acute cardiopulmonary disease. Electronically Signed   By: Julian Hy [name]   On: 03/17/2016 14:30     OV 07/18/2016  Chief Complaint  Patient presents with  . Follow-up    Pt states her breathing is unchanged since last OV. Pt c/o sore throat, dry cough. Pt denies CP/tightness and f/c/s.   Follow-up COPD. She is to be classified as goal stage III COPD. Recently we repeated her only function test and this showed improvement in August 2017 Fev1 1.35L/55% while it ws 34% in 2015. Ratio 61. The DLCO was low. Classification is now moderate COPD Gold stage II. She continues on inhaler therapy and rehabilitation. In the interim she had colonoscopy without any problems. She also has had a pacemaker placed for acute onset of heart block. She is feeling fine except 2 days ago started with sore throat. She is convinced she will have a COPD exacerbation. She denies any wheezing or excessive shortness of breath or sore throat. But she wants a prednisone and antibiotic course. She wants a prolonged course of prednisone because of sciatica. However on exam she is wheezing.    has a past medical history of Acute cystitis without hematuria (12/22/2015); Anemia; Arthritis; Asthma; Cataracts, bilateral; Chronic diastolic heart failure ([place]) (09/19/2014); Chronic insomnia;  Constipation; COPD (chronic obstructive pulmonary disease) ([place]); DDD (degenerative disc disease); Essential hypertension; GERD (gastroesophageal reflux disease); History of radiation therapy (05/30/11 to 07/07/11); History of shingles; Hyperlipemia; Lung nodule; Obesity; Osteopenia (12/2014); Pancreatitis (11/2014); Rectal cancer ([place]) (04/2011);  Rheumatic heart disease mitral stenosis; S/P AVR (aortic valve replacement) (2015); S/P MVR (mitral valve replacement) (2015); Seizures ([place]); Severe aortic valve stenosis  AVR 3/15 (10/01/2013); Sleep apnea; Small bowel obstruction, partial (08/2013); and Vitamin B12 deficiency (02/28/2015).   reports that she quit smoking about 41 years ago. Her smoking use included Cigarettes. She has a 15.00 pack-year smoking history. She has never used smokeless tobacco.  Past Surgical History:  Procedure Laterality Date  . AORTIC VALVE REPLACEMENT N/A 12/12/2013   Procedure: AORTIC VALVE REPLACEMENT (AVR);  Surgeon: Gaye Pollack, MD; Service: Open Heart Surgery  . BACK SURGERY  2006, 2007   2006 SPACER, 2007 decompression and fusion L4/5  . BOWEL RESECTION  04/02/2012   Procedure: SMALL BOWEL RESECTION;  Surgeon: Stark Klein, MD;  Location: [facility];  Service: General;  Laterality: N/A;  . CARPAL TUNNEL RELEASE Bilateral   . CHOLECYSTECTOMY N/A 01/21/2015   chronic cholecystitis, Stark Klein, MD  . COLON RESECTION  08/25/2011   Procedure: COLON RESECTION LAPAROSCOPIC;  Surgeon: Stark Klein, MD;  Location: [facility];  Service: General;  Laterality: N/A;  Laparoscopic Assisted Low Anterior Resection Diverting Ostomy and onQ pain pump  . COLONOSCOPY  03/2013   1 polyp, rpt 3 yrs Ardis Hughs)  . COLONOSCOPY WITH PROPOFOL N/A 06/09/2016   Procedure: COLONOSCOPY WITH PROPOFOL;  Surgeon: [name], MD;  Location: [facility];  Service: Endoscopy;  Laterality: N/A;  . EP IMPLANTABLE DEVICE N/A 06/16/2016   Procedure: Pacemaker Implant;  Surgeon: Will Meredith Leeds, MD;  Location: [facility];  Service: Cardiovascular;  Laterality: N/A;  . FOOT SURGERY  left foot   hammer toe  . HAND SURGERY  Bil   carpal tunnel  . ILEOSTOMY  08/25/2011  . ILEOSTOMY CLOSURE  04/02/2012   Procedure: ILEOSTOMY TAKEDOWN;  Surgeon: Stark Klein, MD;  Location: [facility];  Service: General;  Laterality: N/A;  . INTRAOPERATIVE  TRANSESOPHAGEAL ECHOCARDIOGRAM N/A 12/12/2013   Procedure: INTRAOPERATIVE TRANSESOPHAGEAL ECHOCARDIOGRAM;  Surgeon: Gaye Pollack, MD;  Location: [facility];  Service: Open Heart Surgery;  Laterality: N/A;  . LEFT AND RIGHT HEART CATHETERIZATION WITH CORONARY ANGIOGRAM N/A 11/27/2013   Procedure: LEFT AND RIGHT HEART CATHETERIZATION WITH CORONARY ANGIOGRAM;  Surgeon: Sanda Klein, MD;  Location: [facility];  Service: Cardiovascular;  Laterality: N/A;  . MITRAL VALVE REPLACEMENT N/A 12/12/2013   Procedure: MITRAL VALVE (MV) REPLACEMENT OR REPAIR;  Surgeon: Gaye Pollack, MD; Service: Open Heart Surgery  . TEE WITHOUT CARDIOVERSION N/A 11/29/2013   Procedure: TRANSESOPHAGEAL ECHOCARDIOGRAM (TEE);  Surgeon: Sueanne Margarita, MD;  Location: [facility];  Service: Cardiovascular;  Laterality: N/A;  . TONSILLECTOMY      Allergies  Allergen Reactions  . Codeine Nausea Only  . Prednisone Hives    In different places all over the body.  . Sulfonamide Derivatives Nausea Only    Immunization History  Administered Date(s) Administered  . H1N1 09/17/2008  . Influenza Split 07/18/2012  . Influenza Whole 08/10/2005, 06/24/2009, 07/11/2011  . Influenza, High Dose Seasonal PF 07/04/2016  . Influenza-Unspecified 07/24/2013, 06/30/2014, 06/30/2015  . Pneumococcal Conjugate-13 06/02/2014  . Pneumococcal Polysaccharide-23 08/10/2005, 04/21/2008, 06/25/2013  . Td 10/11/2005  . Zoster 09/16/2014    Family History  Problem Relation Age of Onset  .  Lung cancer Father   . Heart disease Mother   . Diabetes Mother   . Hypertension Mother   . Stroke Mother   . CAD Son 40  . Heart attack Son 16  . Heart attack Maternal Grandfather   . Colon cancer Neg Hx   . Esophageal cancer Neg Hx   . Stomach cancer Neg Hx   . Rectal cancer Neg Hx      Current Outpatient Prescriptions:  .  albuterol (PROVENTIL HFA;VENTOLIN HFA) 108 (90 Base) MCG/ACT inhaler, Inhale 2 puffs into the lungs every 6 (six) hours as needed  for wheezing., Disp: 1 Inhaler, Rfl: 2 .  amoxicillin (AMOXIL) 500 MG capsule, take 4 capsules by mouth 30 TO 60 MINUTES PRIOR TO DENTAL APPOINTMENT., Disp: 4 capsule, Rfl: 12 .  aspirin 81 MG tablet, Take 81 mg by mouth at bedtime. , Disp: , Rfl:  .  BIOTIN PO, Take 1 tablet by mouth at bedtime. , Disp: , Rfl:  .  bisoprolol (ZEBETA) 5 MG tablet, take 1/2 tablet by mouth once daily, Disp: 15 tablet, Rfl: 11 .  BREO ELLIPTA 200-25 MCG/INH AEPB, inhale 1 puff INTO THE LUNGS daily, Disp: 60 each, Rfl: 6 .  Calcium Carbonate-Vitamin D (CALCIUM 600 + D PO), Take 600 mg by mouth at bedtime. , Disp: , Rfl:  .  Cholecalciferol (VITAMIN D) 2000 UNITS CAPS, Take 2,000 Units by mouth at bedtime. , Disp: , Rfl:  .  furosemide (LASIX) 40 MG tablet, Take one tablet by mouth daily 5 times a week., Disp: 25 tablet, Rfl: 11 .  ipratropium-albuterol (DUONEB) 0.5-2.5 (3) MG/3ML SOLN, Take 3 mLs by nebulization every 6 (six) hours as needed. (Patient taking differently: Take 3 mLs by nebulization every 6 (six) hours as needed (for wheezing or shortness of breath). ), Disp: 360 mL, Rfl: 0 .  lovastatin (MEVACOR) 40 MG tablet, take 1 tablet at bedtime, Disp: 30 tablet, Rfl: 6 .  Miconazole Nitrate 2 % POWD, Apply 1 application topically daily as needed (for skin). Apply to stomach and under breasts, Disp: , Rfl:  .  montelukast (SINGULAIR) 10 MG tablet, take 1 tablet by mouth at bedtime, Disp: 30 tablet, Rfl: 5 .  Multiple Vitamins-Minerals (PRESERVISION AREDS 2 PO), Take 1 capsule by mouth daily., Disp: , Rfl:  .  pantoprazole (PROTONIX) 40 MG tablet, take 1 tablet by mouth at bedtime, Disp: 90 tablet, Rfl: 1 .  polycarbophil (FIBERCON) 625 MG tablet, Take 625 mg by mouth daily., Disp: , Rfl:  .  polyethylene glycol (MIRALAX / GLYCOLAX) packet, Take 17 g by mouth daily., Disp: 14 each, Rfl: 0 .  potassium chloride (K-DUR) 10 MEQ tablet, take 2 tablets by mouth once daily (Patient taking differently: take 2 tablets by  mouth once daily ( 5 days a week)), Disp: 60 tablet, Rfl: 11 .  solifenacin (VESICARE) 10 MG tablet, Take 1 tablet (10 mg total) by mouth daily., Disp: 30 tablet, Rfl: 11 .  SPIRIVA HANDIHALER 18 MCG inhalation capsule, inhale the contents of one capsule in the handihaler once daily, Disp: 30 capsule, Rfl: 5 .  traMADol (ULTRAM) 50 MG tablet, Take 1 tablet (50 mg total) by mouth every 6 (six) hours as needed for moderate pain., Disp: 30 tablet, Rfl: 0 .  traZODone (DESYREL) 50 MG tablet, Take 1 tablet (50 mg total) by mouth at bedtime., Disp: 30 tablet, Rfl: 11 .  vitamin B-12 (CYANOCOBALAMIN) 500 MCG tablet, Take 500 mcg by mouth daily., Disp: , Rfl:  Allergies  Allergen Reactions  . Codeine Nausea Only  . Prednisone Hives    In different places all over the body.  . Sulfonamide Derivatives Nausea Only     Review of Systems     Objective:   Physical Exam  Constitutional: She is oriented to person, place, and time. She appears well-developed and well-nourished. No distress.  HENT:  Head: Normocephalic and atraumatic.  Right Ear: External ear normal.  Left Ear: External ear normal.  Mouth/Throat: Oropharynx is clear and moist. No oropharyngeal exudate.  Eyes: Conjunctivae and EOM are normal. Pupils are equal, round, and reactive to light. Right eye exhibits no discharge. Left eye exhibits no discharge. No scleral icterus.  Neck: Normal range of motion. Neck supple. No JVD present. No tracheal deviation present. No thyromegaly present.  Cardiovascular: Normal rate, regular rhythm, normal heart sounds and intact distal pulses.  Exam reveals no gallop and no friction rub.   No murmur heard. Pulmonary/Chest: Effort normal. No respiratory distress. She has wheezes. She has no rales. She exhibits no tenderness.  Bilateral diffuse wheezing present  Abdominal: Soft. Bowel sounds are normal. She exhibits no distension and no mass. There is no tenderness. There is no rebound and no guarding.   Musculoskeletal: Normal range of motion. She exhibits no edema or tenderness.  Lymphadenopathy:    She has no cervical adenopathy.  Neurological: She is alert and oriented to person, place, and time. She has normal reflexes. No cranial nerve deficit. She exhibits normal muscle tone. Coordination normal.  Skin: Skin is warm and dry. No rash noted. She is not diaphoretic. No erythema. No pallor.  Psychiatric: She has a normal mood and affect. Her behavior is normal. Judgment and thought content normal.  Vitals reviewed.  Vitals:   07/18/16 1124  BP: (!) 146/88  Pulse: 65  SpO2: 95%  Weight: 210 lb (95.3 kg)  Height: 5\' 6"  (1.676 m)         Assessment:       ICD-9-CM ICD-10-CM   1. COPD exacerbation ([place]) 491.21 J44.1        Plan:      Please take Take prednisone 40mg  once daily x 3 days, then 30mg  once daily x 3 days, then 20mg  once daily x 3 days, then prednisone 10mg  once daily  x 3 days and stop  Take doxycycline 100mg  po twice daily x 5 days; take after meals and avoid sunlight  Continue other copd care  Followup 3 months or sooner if needed    Dr. Brand Males, [name], [facility].C.P Pulmonary and Critical Care Medicine Staff Physician [place] [facility] Pager: [phone], If no answer or between  15:00h - 7:00h: call [phone]  07/18/2016 11:55 AM

## 2016-07-18 NOTE — Patient Instructions (Addendum)
ICD-9-CM ICD-10-CM   1. COPD exacerbation ([facility]) 491.21 J44.1     Please take Take prednisone 40mg  once daily x 3 days, then 30mg  once daily x 3 days, then 20mg  once daily x 3 days, then prednisone 10mg  once daily  x 3 days and stop  Take doxycycline 100mg  po twice daily x 5 days; take after meals and avoid sunlight  Continue other copd care  Followup 3 months or sooner if needed

## 2016-07-18 NOTE — Addendum Note (Signed)
Addended by: Collier Salina on: 07/18/2016 11:57 AM   Modules accepted: Orders

## 2016-07-22 ENCOUNTER — Telehealth: Payer: Self-pay | Admitting: Internal Medicine

## 2016-07-22 DIAGNOSIS — J449 Chronic obstructive pulmonary disease, unspecified: Secondary | ICD-10-CM

## 2016-07-22 NOTE — Telephone Encounter (Signed)
[name]  Let [name] know that in review of her records I realized I never done alpja 1 genetic test for copd.  I ordered it. If she can do it sometime next week or 10 days would be nice  Thanks  Dr. Brand Males, [name], [facility].C.P Pulmonary and Critical Care Medicine Staff Physician [place] [facility] Pager: [phone], If no answer or between  15:00h - 7:00h: call [phone]  07/22/2016 8:19 AM   .

## 2016-07-22 NOTE — Telephone Encounter (Signed)
Called and spoke to pt. Informed her of the recs per MR. Pt verbalized understanding and states she will come in next week to have the lab collected.

## 2016-07-25 ENCOUNTER — Telehealth: Payer: Self-pay | Admitting: Cardiology

## 2016-07-25 NOTE — Telephone Encounter (Signed)
Pt called still c/o shortness of breath. RN device tech said to schedule pt an appt for Friday 07-29-16 to turn off rate response. Pt agreed to come in at 9:30 AM on Friday 07-29-16.

## 2016-07-26 ENCOUNTER — Encounter (HOSPITAL_COMMUNITY)
Admission: RE | Admit: 2016-07-26 | Discharge: 2016-07-26 | Disposition: A | Discharge status: Home / Self Care | Payer: Self-pay | Source: Ambulatory Visit | Attending: Internal Medicine | Admitting: Internal Medicine

## 2016-07-27 ENCOUNTER — Encounter: Payer: Self-pay | Admitting: Family Medicine

## 2016-07-27 MED ORDER — TRIAMCINOLONE ACETONIDE 0.1 % EX CREA
1.0000 | TOPICAL_CREAM | Freq: Two times a day (BID) | CUTANEOUS | 0 refills | Status: DC
Start: 2016-07-27 — End: 2017-06-28

## 2016-07-28 ENCOUNTER — Encounter (HOSPITAL_COMMUNITY)
Admission: RE | Admit: 2016-07-28 | Discharge: 2016-07-28 | Disposition: A | Discharge status: Home / Self Care | Payer: Self-pay | Source: Ambulatory Visit | Attending: Internal Medicine | Admitting: Internal Medicine

## 2016-07-29 ENCOUNTER — Ambulatory Visit (INDEPENDENT_AMBULATORY_CARE_PROVIDER_SITE_OTHER): Payer: Medicare Other | Admitting: *Deleted

## 2016-07-29 DIAGNOSIS — Z950 Presence of cardiac pacemaker: Secondary | ICD-10-CM

## 2016-07-29 LAB — CUP PACEART INCLINIC DEVICE CHECK
Battery Remaining Longevity: 120 mo
Battery Voltage: 3.04 V
Brady Statistic RA Percent Paced: 67 %
Brady Statistic RV Percent Paced: 91 %
Date Time Interrogation Session: 20171020111943
Implantable Lead Implant Date: 20170907
Implantable Lead Implant Date: 20170907
Implantable Lead Location: 753859
Implantable Lead Location: 753860
Lead Channel Impedance Value: 537.5 Ohm
Lead Channel Impedance Value: 662.5 Ohm
Lead Channel Pacing Threshold Amplitude: 0.625 V
Lead Channel Pacing Threshold Amplitude: 0.625 V
Lead Channel Pacing Threshold Pulse Width: 0.5 ms
Lead Channel Pacing Threshold Pulse Width: 0.5 ms
Lead Channel Sensing Intrinsic Amplitude: 12 mV
Lead Channel Sensing Intrinsic Amplitude: 5 mV
Lead Channel Setting Pacing Amplitude: 0.875
Lead Channel Setting Pacing Amplitude: 1.625
Lead Channel Setting Pacing Pulse Width: 0.5 ms
Lead Channel Setting Sensing Sensitivity: 2 mV
Pulse Gen Model: 2272
Pulse Gen Serial Number: 7945283

## 2016-07-29 NOTE — Progress Notes (Signed)
Patient returns to office to reprogram sensor off (N/C) due to worsened exertional ShOB since sensor activated on 9/18 and reprogrammed on 10/5. 10 AMS episodes--all EGMs indicate competitive pacing. Sensor reprogrammed off. Patient agrees to f/u with Dr. Sallyanne Kuster (primary cardiologist) if symptoms do not improve with this change. ROV with WC on 09/23/16 as scheduled.

## 2016-08-03 ENCOUNTER — Telehealth: Payer: Self-pay | Admitting: Internal Medicine

## 2016-08-03 NOTE — Telephone Encounter (Signed)
She still has not done her alpha 1. Just a reminder

## 2016-08-03 NOTE — Telephone Encounter (Addendum)
Called and spoke to pt. Informed pt to remember to get alpha 1 lab. Pt verbalized understanding and denied any further questions or concerns at this time.

## 2016-08-04 ENCOUNTER — Encounter (HOSPITAL_COMMUNITY)
Admission: RE | Admit: 2016-08-04 | Discharge: 2016-08-04 | Disposition: A | Discharge status: Home / Self Care | Payer: Self-pay | Source: Ambulatory Visit | Attending: Internal Medicine | Admitting: Internal Medicine

## 2016-08-09 ENCOUNTER — Encounter (HOSPITAL_COMMUNITY)
Admission: RE | Admit: 2016-08-09 | Discharge: 2016-08-09 | Disposition: A | Discharge status: Home / Self Care | Payer: Self-pay | Source: Ambulatory Visit | Attending: Internal Medicine | Admitting: Internal Medicine

## 2016-08-11 ENCOUNTER — Encounter (HOSPITAL_COMMUNITY)
Admission: RE | Admit: 2016-08-11 | Discharge: 2016-08-11 | Disposition: A | Discharge status: Home / Self Care | Payer: Self-pay | Source: Ambulatory Visit | Attending: Internal Medicine | Admitting: Internal Medicine

## 2016-08-11 DIAGNOSIS — R0600 Dyspnea, unspecified: Secondary | ICD-10-CM | POA: Insufficient documentation

## 2016-08-11 DIAGNOSIS — Z954 Presence of other heart-valve replacement: Secondary | ICD-10-CM | POA: Insufficient documentation

## 2016-08-12 ENCOUNTER — Telehealth: Payer: Self-pay | Admitting: Internal Medicine

## 2016-08-12 ENCOUNTER — Encounter: Payer: Medicare Other | Admitting: *Deleted

## 2016-08-12 ENCOUNTER — Other Ambulatory Visit (HOSPITAL_BASED_OUTPATIENT_CLINIC_OR_DEPARTMENT_OTHER): Payer: Medicare Other

## 2016-08-12 ENCOUNTER — Encounter: Payer: Medicare Other | Admitting: Pulmonary Disease

## 2016-08-12 ENCOUNTER — Other Ambulatory Visit: Payer: Medicare Other

## 2016-08-12 DIAGNOSIS — C20 Malignant neoplasm of rectum: Secondary | ICD-10-CM

## 2016-08-12 DIAGNOSIS — J449 Chronic obstructive pulmonary disease, unspecified: Secondary | ICD-10-CM

## 2016-08-12 LAB — CEA (IN HOUSE-CHCC): CEA (CHCC-In House): <1 ng/mL (ref 0.00–5.00)

## 2016-08-12 NOTE — Telephone Encounter (Signed)
Title: Randomised, Double-Blind (Sponsor Open), Placebo-Controlled, Multicentre, Dose Ranging Study to Evaluate the Efficacy and Safety of Danirixin Tablets Administered Twice Daily Compared With Placebo for 24 Weeks in Adults With COPD  Sponsor: [employer] , Protoocol Number: [id number], NCT Trial #: [id number]  Synopsis:  Following baseline assessments collected over a 7 day period participants will be randomized (1:1:1:1:1:1) to receive one of five dose strengths of danirixin (5 milligram [mg], 10 mg, 25 mg, 35 mg and 50 mg) or placebo. Study treatment will be administered orally twice daily for 24 weeks. Participants will continue with their standard of care inhaled medications during study. Follow up will continue up to 28 days post last dose  Key Inclusion Criteria: age 8-80, COPD with fev1 >/= 40%, ratio <0.7, >/= 2 copd exacerbations in past 1 year treated with either antibiotics or steroids, but >/= 14 days since last antibiotic or steroid for exacerbation, >/= 10 pack smoking history  Key Exclusion Criteria: non-COPD lung disease, pulse ox < 88% at rest on room air (i.e, restting o2 patients excluded), active phase of pulmonary rehabilitation (maintenance ok), previous lung surgery, QTc prolongation, HIV positive etc..   Key end points: Changes in respiratory symptoms and copd exacerbations and safety of DNX  Key features of DNX:   selective CXC chemokine receptor (CXCR2) antagonist . Positive trends in COPD exacerbation, symptoms and health status  Safety of DNX: As of April 2017 (5 completed Phase 1 studies, and 1 Phase 2A study of moderate copd patients with 45 patients):Marland Kitchen Most side effects are seen at 100mg  bid (current study max is 50mg  bid)  Phase 1 Normal Adults: for doses </= 50mg  bid   - Any event: 30% v 13% for placebo   - headache 10-20% v 0-17% for placebo   - Fatigue/Lethargy: 0-10% v 0-3% for placebo - Abd. Pain: 0-11% v 0% placebo (20% at 200mg  bid) - Dizziness:  0-11% v 0-3% for placebo (20% at 200mg  bid) - Cough 0% v 0% - Study withdrawal -  0% v 0% (4-20% at high dose)   - SAE - 0% v 0% - All side effects mild - moderate - No changes in labs, EKG and vitals including PMN per PI meeting slide - though in 1 study I came across transient decrease in PMN  3. COPD - data from   [place] meeting in spring 2017 and  IB April 2017 -  AECOPD and CAT scores better with drug after 1 year   - No change in PMN, EKG, Vitals, Labs, Spirometry   - AE - any event is similar for Placebo  V Drug  (52-77% v 55-80%)    - spl AE comments   - Cystitis 3% placebo v 5% IP 75mg  bid   - Back pain: 0% placebo v 5% IP 75mg  bid   - Nasopharyngitis: 33% placebo v 29% IP 75mg  bid   - Headache: 8% placebov v 9% IP 75mg  bid   - Abd Pain Ipper 6% placbo v 0% IP 75mg  bid  -    - SAE - More in placebo than drug (31 events v 10 events); when PI deemed 2 drug related it was placebo group!!   - No change in infection rate:  Placebo v Drug (50% v 42%) -  known to produce mild to moderate headache and diarrhea. No known influence on vital signs, ECG, spirometry, labs when compared to placebo. No evidence of increased infections or neutropenia ........................................................................................................................................................................  S:   [name] with  [birth year] came to research office earlier 08/12/2016 for review of ICF for above study. Staff told me she had several questions and all were answered. She left with need to talk to her son over weekend. As part of ICF process I called her now and went over the study and questions she might have.   Her specific questions to me related to back pain side effect, dosing, and drug safety and trial participation. Over phone I went over the above in detail. Also talked about drug approval process and IRB oversight and data reports for safety. In addition went over the  following concepts below of research study below   1. Scientific Purpose  Clinical research is designed to produce generalizable knowledge and to answer questions about the safety and efficacy of intervention(s) under study in order to determine whether or not they may be useful for the care of future patients.  2. Study Procedures  Participation in a trial may involve procedures or tests, in addition to the intervention(s) under study, that are intended only or primarily to generate scientific knowledge and that are otherwise not necessary for patient care.   3. Uncertainty  For intervention(s) under study in clinical research, there often is less knowledge and more uncertainty about the risks and benefits to a population of trial participants than there is when a doctor offers a patient standard interventions.   4. Adherence to Protocol  Administration of the intervention(s) under study is typically based on a strict protocol with defined dose, scheduling, and use or avoidance of concurrent medications, compared to administration of standard interventions.  5. Clinician as Investigator  Clinicians who are in health care settings provide treatment; in a clinical trial setting, they are also investigating safety and efficacy of an intervention. In otherwise your doctor or nurse practitioner can be wearing 2 hats - one as care giver another as Company secretary  6. Patient as Visual merchandiser Subject  Patients participating in research trials are research subjects or volunteers. In other words participating in research is 100% voluntary and at one's own free weill. The decision to participate or not participate will NOT affect patient care and the doctor-patient relationship in any way  7. Financial Conflict of Interest Disclosure  Your referring physician(s) for the research trial has an investment interest in [employer], [facility] the clinical trials site and is both the company and the  investigators are being compensated for their effort in trial research activities.     She verbalized understanding and said will get back to [place] next few to several days after she discusses with son    Dr. Brand Males, [name], [facility].C.P Pulmonary and Critical Care Medicine Staff Physician [place] [facility] Principal Investigator [employer]  Pager: [phone], If no answer or between  15:00h - 7:00h: call [phone]  08/12/2016 3:37 PM

## 2016-08-13 LAB — CEA: CEA: 1 ng/mL (ref 0.0–4.7)

## 2016-08-15 ENCOUNTER — Other Ambulatory Visit: Payer: Medicare Other

## 2016-08-19 ENCOUNTER — Ambulatory Visit (HOSPITAL_BASED_OUTPATIENT_CLINIC_OR_DEPARTMENT_OTHER): Payer: Medicare Other | Admitting: Oncology

## 2016-08-19 VITALS — BP 159/59 | HR 62 | Temp 97.8°F | Resp 18 | Ht 66.0 in | Wt 218.0 lb

## 2016-08-19 DIAGNOSIS — C20 Malignant neoplasm of rectum: Secondary | ICD-10-CM

## 2016-08-19 DIAGNOSIS — Z85048 Personal history of other malignant neoplasm of rectum, rectosigmoid junction, and anus: Secondary | ICD-10-CM

## 2016-08-19 NOTE — Progress Notes (Signed)
  [place] OFFICE PROGRESS NOTE   Diagnosis: Rectal cancer  INTERVAL HISTORY:   [name] returns as scheduled. She feels well. No difficulty with bowel function. She had a surveillance colonoscopy 06/09/2016. No polyps were found. She will be scheduled for a repeat colonoscopy in 5 years. She had a pacemaker placed in September.  She is followed by pulmonary medicine for COPD. She is considering enrollment on a clinical trial.  Objective:  Vital signs in last 24 hours:  Blood pressure (!) 159/59, pulse 62, temperature 97.8 F (36.6 C), temperature source Oral, resp. rate 18, height 5\' 6"  (1.676 m), weight 218 lb (98.9 kg), SpO2 96 %.    HEENT: Neck without mass Lymphatics: No cervical, supra-clavicular, axillary, or inguinal nodes Resp: Scattered rhonchi/wheezes at the posterior chest bilaterally, no respiratory distress Cardio: Regular rate and rhythm GI: No hepatosplenomegaly, no mass, nontender Vascular: No leg edema  Skin: 5 mm cutaneous cyst at the right axilla    Lab Results: CEA on 08/12/2016-1.0    Medications: I have reviewed the patient's current medications.  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 Ileostomy takedown 04/02/2012  3. COPD  4. admission with a partial small bowel  obstruction November 2014, resolved with bowel rest  5. aortic and mitral valve replacements 12/12/2013  6. 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 She has a good prognosis for a long-term disease-free survival. She will continue surveillance colonoscopies with Dr. Ardis Hughs. She was discharged from the Oncology clinic today. She will continue clinical follow-up with Dr. Danise Mina. I am available to see her in the future as needed.  [name], MD  08/19/2016  11:35 AM

## 2016-08-20 LAB — ALPHA-1 ANTITRYPSIN PHENOTYPE: A-1 Antitrypsin: 199 mg/dL (ref 83–199)

## 2016-08-23 ENCOUNTER — Encounter (HOSPITAL_COMMUNITY)
Admission: RE | Admit: 2016-08-23 | Discharge: 2016-08-23 | Disposition: A | Discharge status: Home / Self Care | Payer: Self-pay | Source: Ambulatory Visit | Attending: Internal Medicine | Admitting: Internal Medicine

## 2016-08-26 ENCOUNTER — Encounter: Payer: Self-pay | Admitting: Adult Health

## 2016-08-26 ENCOUNTER — Encounter (INDEPENDENT_AMBULATORY_CARE_PROVIDER_SITE_OTHER): Payer: Medicare Other | Admitting: *Deleted

## 2016-08-26 ENCOUNTER — Ambulatory Visit (INDEPENDENT_AMBULATORY_CARE_PROVIDER_SITE_OTHER): Payer: Medicare Other | Admitting: Adult Health

## 2016-08-26 ENCOUNTER — Telehealth: Payer: Self-pay | Admitting: Internal Medicine

## 2016-08-26 VITALS — BP 131/82 | HR 59 | Temp 98.1°F | Resp 12 | Ht 66.0 in | Wt 210.0 lb

## 2016-08-26 DIAGNOSIS — Z006 Encounter for examination for normal comparison and control in clinical research program: Secondary | ICD-10-CM

## 2016-08-26 DIAGNOSIS — J449 Chronic obstructive pulmonary disease, unspecified: Secondary | ICD-10-CM

## 2016-08-26 HISTORY — DX: Encounter for examination for normal comparison and control in clinical research program: Z00.6

## 2016-08-26 NOTE — Progress Notes (Signed)
Title: Randomised, Double-Blind (Sponsor Open), Placebo-Controlled, Multicentre, Dose Ranging Study to Evaluate the Efficacy and Safety of Danirixin Tablets Administered Twice Daily Compared With Placebo for 24 Weeks in Adults With COPD  Sponsor: [employer] , Protoocol Number: [id number], NCT Trial #: [id number]  Synopsis:  Following baseline assessments collected over a 7 day period participants will be randomized (1:1:1:1:1:1) to receive one of five dose strengths of danirixin (5 milligram [mg], 10 mg, 25 mg, 35 mg and 50 mg) or placebo. Study treatment will be administered orally twice daily for 24 weeks. Participants will continue with their standard of care inhaled medications during study. Follow up will continue up to 28 days post last dose  Key Inclusion Criteria: age 71-80, COPD with fev1 >/= 40%, ratio <0.7, >/= 2 copd exacerbations in past 1 year treated with either antibiotics or steroids, but >/= 14 days since last antibiotic or steroid for exacerbation, >/= 10 pack smoking history  Key Exclusion Criteria: non-COPD lung disease, pulse ox < 88% at rest on room air (i.e, restting o2 patients excluded), active phase of pulmonary rehabilitation (maintenance ok), previous lung surgery, QTc prolongation, HIV positive etc..   Key end points: Changes in respiratory symptoms and copd exacerbations and safety of DNX  Key features of DNX:   selective CXC chemokine receptor (CXCR2) antagonist . Positive trends in COPD exacerbation, symptoms and health status  Safety of DNX: As of April 2017 (5 completed Phase 1 studies, and 1 Phase 2A study of moderate copd patients with 45 patients):Marland Kitchen Most side effects are seen at 100mg  bid (current study max is 50mg  bid)  Phase 1 Normal Adults: for doses </= 50mg  bid   - Any event: 30% v 13% for placebo   - headache 10-20% v 0-17% for placebo   - Fatigue/Lethargy: 0-10% v 0-3% for placebo - Abd. Pain: 0-11% v 0% placebo (20% at 200mg  bid) - Dizziness:  0-11% v 0-3% for placebo (20% at 200mg  bid) - Cough 0% v 0% - Study withdrawal -  0% v 0% (4-20% at high dose)   - SAE - 0% v 0% - All side effects mild - moderate - No changes in labs, EKG and vitals including PMN per PI meeting slide - though in 1 study I came across transient decrease in PMN  3. COPD - data from   [place] meeting in spring 2017 and  IB April 2017 -  AECOPD and CAT scores better with drug after 1 year   - No change in PMN, EKG, Vitals, Labs, Spirometry   - AE - any event is similar for Placebo  V Drug  (71-77% v 55-80%)    - spl AE comments   - Cystitis 3% placebo v 5% IP 75mg  bid   - Back pain: 0% placebo v 5% IP 75mg  bid   - Nasopharyngitis: 33% placebo v 29% IP 75mg  bid   - Headache: 8% placebov v 9% IP 75mg  bid   - Abd Pain Ipper 6% placbo v 0% IP 75mg  bid  -    - SAE - More in placebo than drug (31 events v 10 events); when PI deemed 2 drug related it was placebo group!!   - No change in infection rate:  Placebo v Drug (50% v 42%) -  known to produce mild to moderate headache and diarrhea. No known influence on vital signs, ECG, spirometry, labs when compared to placebo. No evidence of increased infections or neutropenia .......................................................................................................................................................................Marland Kitchen

## 2016-08-26 NOTE — Progress Notes (Signed)
Patient [name], DOB 09-17-1946, is present today 17/NOV/2017 for  Pre-Screen Visit 0 and screening visit, visit 1 on protocol. Patient states she has cough and increased mucus several days a week. See Office visit encounter for EKG, spirometry. Central labs (fasting) completed per protocol. See paper research chart for completed record of medical history and inclusion/exclusion review, and con-meds.   [place] Bing, [place], Research Assistant

## 2016-08-26 NOTE — Assessment & Plan Note (Signed)
Stable cont w/ current regimen  follow up Dr. [name] as planned

## 2016-08-26 NOTE — Telephone Encounter (Signed)
   Title: Randomised, Double-Blind (Sponsor Open), Placebo-Controlled, Multicentre, Dose Ranging Study to Evaluate the Efficacy and Safety of Danirixin Tablets Administered Twice Daily Compared With Placebo for 24 Weeks in Adults With COPD.   Sponsor: [employer] , Protoocol Number: [id number], NCT Trial #: [id number]     ................................... 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 Brand Males, [name], [facility].C.P Pulmonary and Critical Care Medicine Staff Physician [place] [facility] Pager: [phone], If no answer or between  15:00h - 7:00h: call [phone]  08/26/2016 12:18 PM

## 2016-08-26 NOTE — Progress Notes (Signed)
Title: Randomised, Double-Blind (Sponsor Open), Placebo-Controlled, Multicentre, Dose Ranging Study to Evaluate the Efficacy and Safety of Danirixin Tablets Administered Twice Daily Compared With Placebo for 24 Weeks in Adults With COPD  Sponsor: [employer] , Protoocol Number: [id number], NCT Trial #: [id number]  Synopsis:  Following baseline assessments collected over a 7 day period participants will be randomized (1:1:1:1:1:1) to receive one of five dose strengths of danirixin (5 milligram [mg], 10 mg, 25 mg, 35 mg and 50 mg) or placebo. Study treatment will be administered orally twice daily for 24 weeks. Participants will continue with their standard of care inhaled medications during study. Follow up will continue up to 28 days post last dose  Key Inclusion Criteria: age 30-80, COPD with fev1 >/= 40%, ratio <0.7, >/= 2 copd exacerbations in past 1 year treated with either antibiotics or steroids, but >/= 14 days since last antibiotic or steroid for exacerbation, >/= 10 pack smoking history  Key Exclusion Criteria: non-COPD lung disease, pulse ox < 88% at rest on room air (i.e, restting o2 patients excluded), active phase of pulmonary rehabilitation (maintenance ok), previous lung surgery, QTc prolongation, HIV positive etc..   Key end points: Changes in respiratory symptoms and copd exacerbations and safety of DNX  Key features of DNX:   selective CXC chemokine receptor (CXCR2) antagonist . Positive trends in COPD exacerbation, symptoms and health status  Safety of DNX: As of April 2017 (5 completed Phase 1 studies, and 1 Phase 2A study of moderate copd patients with 45 patients):Marland Kitchen Most side effects are seen at 100mg  bid (current study max is 50mg  bid)  Phase 1 Normal Adults: for doses </= 50mg  bid   - Any event: 30% v 13% for placebo   - headache 10-20% v 0-17% for placebo   - Fatigue/Lethargy: 0-10% v 0-3% for placebo - Abd. Pain: 0-11% v 0% placebo (20% at 200mg  bid) - Dizziness:  0-11% v 0-3% for placebo (20% at 200mg  bid) - Cough 0% v 0% - Study withdrawal -  0% v 0% (4-20% at high dose)   - SAE - 0% v 0% - All side effects mild - moderate - No changes in labs, EKG and vitals including PMN per PI meeting slide - though in 1 study I came across transient decrease in PMN  3. COPD - data from   [place] meeting in spring 2017 and  IB April 2017 -  AECOPD and CAT scores better with drug after 1 year   - No change in PMN, EKG, Vitals, Labs, Spirometry   - AE - any event is similar for Placebo  V Drug  (52-77% v 55-80%)    - spl AE comments   - Cystitis 3% placebo v 5% IP 75mg  bid   - Back pain: 0% placebo v 5% IP 75mg  bid   - Nasopharyngitis: 33% placebo v 29% IP 75mg  bid   - Headache: 8% placebov v 9% IP 75mg  bid   - Abd Pain Ipper 6% placbo v 0% IP 75mg  bid  -    - SAE - More in placebo than drug (31 events v 10 events); when PI deemed 2 drug related it was placebo group!!   - No change in infection rate:  Placebo v Drug (50% v 42%) -  known to produce mild to moderate headache and diarrhea. No known influence on vital signs, ECG, spirometry, labs when compared to placebo. No evidence of increased infections or neutropenia ........................................................................................................................................................................  [name], June 01, 1946, subject ID  [id number], is present today for Research Visit, Visit 0 Pre-Screening on protocol, for the above listed clinical trial.  This subject has voluntarily consented to participate, see this subject's paper research chart for documentation of the consenting process.  This RN and [place] Bing present for this visit.  Subject will also complete Visit 1 today, as allowed per protocol.  [place] Bing will complete Visit 1 procedures with Ms Micklos.   [name], RN Research Nurse Office [phone]

## 2016-08-26 NOTE — Assessment & Plan Note (Signed)
Title: Randomised, Double-Blind (Sponsor Open), Placebo-Controlled, Multicentre, Dose Ranging Study to Evaluate the Efficacy and Safety of Danirixin Tablets Administered Twice Daily Compared With Placebo for 24 Weeks in Adults With COPD  Sponsor: [employer] , Protoocol Number: [id number], NCT Trial #: [id number]  Visit one  Research protocol done  Cardiac hx and EKG reviewed with PI Dr. [name] and pt is felt this is not an exclusion for the the study  Cont on research protocol.

## 2016-08-26 NOTE — Progress Notes (Signed)
   Subjective:    Patient ID: [name], female    DOB: [birth year], 70 y.o.   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 We reviewed the study and consent was reviewed and signed . She has frequent COPD flare s.  She has had no recent PNA,.   NO  abx use in last 2 week.. Is not on Oxygen .   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  Case discussed with PI- Dr. [name] with review of EKG , who reviewed case with her cardiologist.  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   Review of Systems See HPI     Objective:   Physical Exam Vitals:   08/26/16 1039  BP: 131/82  Pulse: (!) 59  Resp: 12  Temp: 98.1 F (36.7 C)  TempSrc: Oral  SpO2: 95%  Weight: 210 lb (95.3 kg)  Height: 5\' 6"  (1.676 m)   GEN: A/Ox3; pleasant , NAD, well nourished    HEENT:  [place]/AT,   EACs-clear, TMs-wnl, NOSE-clear, THROAT-clear, no lesions, no postnasal drip or exudate noted.   NECK:  Supple w/ fair ROM; no JVD; normal carotid impulses w/o bruits; no thyromegaly or nodules palpated; no lymphadenopathy.    RESP  Clear  P & A; w/o, wheezes/ rales/ or rhonchi. no accessory muscle use, no dullness to percussion  CARD:  RRR, no m/r/g  , no peripheral edema, pulses intact, no cyanosis or clubbing.  GI:   Soft & nt; nml bowel sounds; no organomegaly or masses detected.   Musco: Warm bil, no deformities or joint swelling noted.   Neuro: alert, no focal deficits noted.    Skin: Warm, no lesions or rashes     Mikale Silversmith NP-C  [place] [facility]  08/26/2016

## 2016-08-29 NOTE — Progress Notes (Signed)
PI OVERSIGHT NOTE   I the Principal Investigator (PI) for the above mentioned study attest that I reviewed the above mentioned   notes on research subject  [name]  born 19-May-1946 . I  agree with the findings mentioned above  Dr. Brand Males, [name], [place].C.P Pulmonary and Critical Care Medicine Staff Physician [facility] Pager: [phone], If no answer or between  15:00h - 7:00h: call [phone]  08/29/2016 2:07 PM

## 2016-08-30 NOTE — Progress Notes (Signed)
Called and spoke to pt. Informed her of the results per MR. Pt verbalized understanding and denied any further questions or concerns at this time.  

## 2016-09-05 ENCOUNTER — Encounter: Payer: Medicare Other | Admitting: *Deleted

## 2016-09-05 DIAGNOSIS — J449 Chronic obstructive pulmonary disease, unspecified: Secondary | ICD-10-CM

## 2016-09-05 DIAGNOSIS — Z006 Encounter for examination for normal comparison and control in clinical research program: Secondary | ICD-10-CM

## 2016-09-05 NOTE — Progress Notes (Signed)
Title: Randomised, Double-Blind (Sponsor Open), Placebo-Controlled, Multicentre, Dose Ranging Study to Evaluate the Efficacy and Safety of Danirixin Tablets Administered Twice Daily Compared With Placebo for 24 Weeks in Adults With COPD  Sponsor: [employer] , Protoocol Number: [id number], NCT Trial #: [id number]  Synopsis:  Following baseline assessments collected over a 7 day period participants will be randomized (1:1:1:1:1:1) to receive one of five dose strengths of danirixin (5 milligram [mg], 10 mg, 25 mg, 35 mg and 50 mg) or placebo. Study treatment will be administered orally twice daily for 24 weeks. Participants will continue with their standard of care inhaled medications during study. Follow up will continue up to 28 days post last dose  Key Inclusion Criteria: age 36-80, COPD with fev1 >/= 40%, ratio <0.7, >/= 2 copd exacerbations in past 1 year treated with either antibiotics or steroids, but >/= 14 days since last antibiotic or steroid for exacerbation, >/= 10 pack smoking history  Key Exclusion Criteria: non-COPD lung disease, pulse ox < 88% at rest on room air (i.e, restting o2 patients excluded), active phase of pulmonary rehabilitation (maintenance ok), previous lung surgery, QTc prolongation, HIV positive etc..   Key end points: Changes in respiratory symptoms and copd exacerbations and safety of DNX  Key features of DNX:   selective CXC chemokine receptor (CXCR2) antagonist . Positive trends in COPD exacerbation, symptoms and health status  Safety of DNX: As of April 2017 (5 completed Phase 1 studies, and 1 Phase 2A study of moderate copd patients with 45 patients):Marland Kitchen Most side effects are seen at 100mg  bid (current study max is 50mg  bid)  Phase 1 Normal Adults - dose </= 50mg  bid Study drug -    placebo  General All side effects mild - moderate. No changes in labs, EKG and vitals including PMN per PI meeting slide  x  Any adverse event 30% 13%  Headache 10-20% 0-17%   Fatigue/Lethargy 0-10% 0-3%  Abd Pain 0-11% (20% at 200mg  bid) 0%   Cough 0% 0%  Study withdrawal 0% 0%  SAE 0% 0%   - 3. COPD  COPD patients -data from IB April 2017 and PI meeting Spring 2017 Study drug Placebo  Efficacy  AECOPD and CAT scores better with drug after 1 year x  General No change in PMN, EKG, Vitals, Labs, Spirometry but produces mild to moderate headache and diarrhea  x  Any AE 52-77% 55-80%  Cystitis 5% at 75mg  bid 3%  Backpain 5% at 75mg  bid 0%  Nasopharyngitis 29% at 75mg  bid 33%  Headache 9% at 75mg  bid 8%  Upper abdominal pain 0% at 75mg  bid 6%  Infection rate 42% 50%  SAE 10 events 31 events   ........................................................................................................................................................................  Subject, [name], DOB October 04, 1946, is present today 27/Nov/17 for visit 2 on protocol. E-diary was dispensed and subject was trained on the use. Subject completed all required questionnaires, report is filed in subject binder. Propeller MDI sensor was dispensed and subject was trained on the use. Subject did not consent to participate in the Actigraph sub-study. Con meds and medical history were reviewed and updated. Please refer to subject binder for specific changes. Subject demonstrated understanding of use requirements for the E-diary and eMDI. All questions answered. Reminders given to subject for Visit 3, which is scheduled to occur on 04/Dec/17.  [name], [place], Research Assistant

## 2016-09-06 ENCOUNTER — Encounter (HOSPITAL_COMMUNITY)
Admission: RE | Admit: 2016-09-06 | Discharge: 2016-09-06 | Disposition: A | Discharge status: Home / Self Care | Payer: Self-pay | Source: Ambulatory Visit | Attending: Internal Medicine | Admitting: Internal Medicine

## 2016-09-08 ENCOUNTER — Encounter: Payer: Self-pay | Admitting: Family Medicine

## 2016-09-08 ENCOUNTER — Ambulatory Visit (INDEPENDENT_AMBULATORY_CARE_PROVIDER_SITE_OTHER): Payer: Medicare Other | Admitting: Family Medicine

## 2016-09-08 VITALS — BP 130/70 | HR 80 | Temp 98.1°F | Wt 212.5 lb

## 2016-09-08 DIAGNOSIS — M25551 Pain in right hip: Secondary | ICD-10-CM | POA: Diagnosis not present

## 2016-09-08 DIAGNOSIS — M25552 Pain in left hip: Secondary | ICD-10-CM

## 2016-09-08 HISTORY — DX: Pain in right hip: M25.552

## 2016-09-08 HISTORY — DX: Pain in right hip: M25.551

## 2016-09-08 MED ORDER — NAPROXEN 375 MG PO TABS: ORAL_TABLET | ORAL | 0 refills | Status: DC

## 2016-09-08 NOTE — Assessment & Plan Note (Signed)
Anticipate bilateral greater trochanteric bursitis given exam today  rec treat with naprosyn 375mg  BIDx 5 days then prn. Provided with exercises from [facility] pt advisor on trochanteric bursitis. If no better with this, suggested f/u with SM Dr Lorelei Pont.

## 2016-09-08 NOTE — Patient Instructions (Signed)
I think you have hip bursitis. Treat with anti inflammatory sent to pharmacy and exercises provided today. If no better, return to see Dr Lorelei Pont for possible steroid injection into bursa.

## 2016-09-08 NOTE — Progress Notes (Signed)
BP 130/70   Pulse 80   Temp 98.1 F (36.7 C) (Oral)   Wt 212 lb 8 oz (96.4 kg)   BMI 34.30 kg/m    CC: bilat hip pain "my joints hurt" Subjective:    Patient ID: [name], female    DOB: 1946-07-21, 70 y.o.   MRN: [id number]  HPI: [name] is a 70 y.o. female presenting on 09/08/2016 for Hip Pain (bilateral radiating into thighs)   Bilateral lateral hip pain ongoing for several months with some radiation down thighs. Worse going up steps or getting out of chair. May have started after excessive ending over to pull weeds over summer. This feels different from sciatica or back pain.   Has been using heating pad and ice packs. Also using biofreeze - not helpful. Taking tylenol as well.  Tries to avoid ibuprofen although it helps.   Relevant past medical, surgical, family and social history reviewed and updated as indicated. Interim medical history since our last visit reviewed. Allergies and medications reviewed and updated. Current Outpatient Prescriptions on File Prior to Visit  Medication Sig  . albuterol (PROVENTIL HFA;VENTOLIN HFA) 108 (90 Base) MCG/ACT inhaler Inhale 2 puffs into the lungs every 6 (six) hours as needed for wheezing.  Marland Kitchen amoxicillin (AMOXIL) 500 MG capsule take 4 capsules by mouth 30 TO 60 MINUTES PRIOR TO DENTAL APPOINTMENT.  Marland Kitchen aspirin 81 MG tablet Take 81 mg by mouth at bedtime.   Marland Kitchen BIOTIN PO Take 1 tablet by mouth at bedtime.   . bisoprolol (ZEBETA) 5 MG tablet take 1/2 tablet by mouth once daily  . BREO ELLIPTA 200-25 MCG/INH AEPB inhale 1 puff INTO THE LUNGS daily  . Calcium Carbonate-Vitamin D (CALCIUM 600 + D PO) Take 600 mg by mouth at bedtime.   . Cholecalciferol (VITAMIN D) 2000 UNITS CAPS Take 2,000 Units by mouth at bedtime.   . furosemide (LASIX) 40 MG tablet Take one tablet by mouth daily 5 times a week.  Marland Kitchen ipratropium-albuterol (DUONEB) 0.5-2.5 (3) MG/3ML SOLN Take 3 mLs by nebulization every 6 (six) hours as needed. (Patient  taking differently: Take 3 mLs by nebulization every 6 (six) hours as needed (for wheezing or shortness of breath). )  . lovastatin (MEVACOR) 40 MG tablet take 1 tablet at bedtime  . Miconazole Nitrate 2 % POWD Apply 1 application topically daily as needed (for skin). Apply to stomach and under breasts  . montelukast (SINGULAIR) 10 MG tablet take 1 tablet by mouth at bedtime  . Multiple Vitamins-Minerals (PRESERVISION AREDS 2 PO) Take 1 capsule by mouth daily.  . pantoprazole (PROTONIX) 40 MG tablet take 1 tablet by mouth at bedtime  . polycarbophil (FIBERCON) 625 MG tablet Take 625 mg by mouth daily.  . polyethylene glycol (MIRALAX / GLYCOLAX) packet Take 17 g by mouth daily.  . potassium chloride (K-DUR) 10 MEQ tablet take 2 tablets by mouth once daily (Patient taking differently: take 2 tablets by mouth once daily ( 5 days a week))  . solifenacin (VESICARE) 10 MG tablet Take 1 tablet (10 mg total) by mouth daily.  Marland Kitchen SPIRIVA HANDIHALER 18 MCG inhalation capsule inhale the contents of one capsule in the handihaler once daily  . traMADol (ULTRAM) 50 MG tablet Take 1 tablet (50 mg total) by mouth every 6 (six) hours as needed for moderate pain.  . traZODone (DESYREL) 50 MG tablet Take 1 tablet (50 mg total) by mouth at bedtime.  . triamcinolone cream (KENALOG) 0.1 % Apply  1 application topically 2 (two) times daily. Apply to AA.  . vitamin B-12 (CYANOCOBALAMIN) 500 MCG tablet Take 500 mcg by mouth daily.   No current facility-administered medications on file prior to visit.     Review of Systems Per HPI unless specifically indicated in ROS section     Objective:    BP 130/70   Pulse 80   Temp 98.1 F (36.7 C) (Oral)   Wt 212 lb 8 oz (96.4 kg)   BMI 34.30 kg/m   Wt Readings from Last 3 Encounters:  09/08/16 212 lb 8 oz (96.4 kg)  08/26/16 210 lb (95.3 kg)  08/19/16 218 lb (98.9 kg)    Physical Exam  Constitutional: She is oriented to person, place, and time. She appears  well-developed and well-nourished. No distress.  Musculoskeletal: She exhibits no edema.  No pain midline spine No paraspinous mm tenderness Neg SLR bilaterally. No pain with int/ext rotation at hip. Neg FABER. No pain at SIJ or sciatic notch bilaterally.  Hip abductors seemed strong   Tender to palpation bilateral GTB  Neurological: She is alert and oriented to person, place, and time.  5/5 strength BLE  Skin: Skin is warm and dry. No rash noted.  Psychiatric: She has a normal mood and affect.  Nursing note and vitals reviewed.  Results for orders placed or performed in visit on 08/12/16  CEA (IN HOUSE-[facility])  Result Value Ref Range   CEA ([facility]-In House) <1.00 0.00 - 5.00 ng/mL  CEA  Result Value Ref Range   CEA 1.0 0.0 - 4.7 ng/mL   *Note: Due to a large number of results and/or encounters for the requested time period, some results have not been displayed. A complete set of results can be found in Results Review.   Lab Results  Component Value Date   CREATININE 1.05 (H) 06/16/2016       Assessment & Plan:   Problem List Items Addressed This Visit    Bilateral hip pain - Primary    Anticipate bilateral greater trochanteric bursitis given exam today  rec treat with naprosyn 375mg  BIDx 5 days then prn. Provided with exercises from [facility] pt advisor on trochanteric bursitis. If no better with this, suggested f/u with SM Dr Lorelei Pont.          Follow up plan: Return if symptoms worsen or fail to improve.  Ria Bush, MD

## 2016-09-08 NOTE — Progress Notes (Signed)
Pre visit review using our clinic review tool, if applicable. No additional management support is needed unless otherwise documented below in the visit note. 

## 2016-09-09 ENCOUNTER — Telehealth: Payer: Self-pay | Admitting: Internal Medicine

## 2016-09-09 NOTE — Telephone Encounter (Signed)
Received an email on [phone] notifying us that subject [phone] has not recorded any diary entries since Visit 2. I called [name], DOB [id number], on [phone] to discuss her diary entries. The subject stated she has been answering the diary questions each night, and has not had any issues accessing the diaries each night. The subject verified she does have Wifi in her home, but is not able to verify if her e-diary is connected to her Wifi or not. I was unable to assist the subject in checking the device over the phone for [facility] connection. Per study manual for this device, the diaries that are being completed are saved in the device and will download to the study portal when the device is connected to Wifi. I advised the subject to continue completing the diaries as instructed each evening, and we will download them when she comes into the office for Visit 3 on Monday, 04Dec2017. I will work with the subject at that time to instruct her on how to connect the device to her home Wifi.   Subject reminded of appointment on Monday 04Dec2017 and reminded of recommendations per protocol.   [place] Bing, [place], Research Assistant

## 2016-09-12 ENCOUNTER — Ambulatory Visit (INDEPENDENT_AMBULATORY_CARE_PROVIDER_SITE_OTHER): Payer: Medicare Other | Admitting: Internal Medicine

## 2016-09-12 ENCOUNTER — Encounter (INDEPENDENT_AMBULATORY_CARE_PROVIDER_SITE_OTHER): Payer: Medicare Other | Admitting: *Deleted

## 2016-09-12 ENCOUNTER — Other Ambulatory Visit: Payer: Self-pay | Admitting: *Deleted

## 2016-09-12 DIAGNOSIS — Z006 Encounter for examination for normal comparison and control in clinical research program: Secondary | ICD-10-CM

## 2016-09-12 DIAGNOSIS — J449 Chronic obstructive pulmonary disease, unspecified: Secondary | ICD-10-CM

## 2016-09-12 NOTE — Progress Notes (Signed)
Title: Randomised, Double-Blind (Sponsor Open), Placebo-Controlled, Multicentre, Dose Ranging Study to Evaluate the Efficacy and Safety of Danirixin Tablets Administered Twice Daily Compared With Placebo for 24 Weeks in Adults With COPD  Sponsor: [employer] , Protoocol Number: [id number], NCT Trial #: [id number]  Synopsis:  Following baseline assessments collected over a 7 day period participants will be randomized (1:1:1:1:1:1) to receive one of five dose strengths of danirixin (5 milligram [mg], 10 mg, 25 mg, 35 mg and 50 mg) or placebo. Study treatment will be administered orally twice daily for 24 weeks. Participants will continue with their standard of care inhaled medications during study. Follow up will continue up to 28 days post last dose  Key Inclusion Criteria: age 71-80, COPD with fev1 >/= 40%, ratio <0.7, >/= 2 copd exacerbations in past 1 year treated with either antibiotics or steroids, but >/= 14 days since last antibiotic or steroid for exacerbation, >/= 10 pack smoking history  Key Exclusion Criteria: non-COPD lung disease, pulse ox < 88% at rest on room air (i.e, restting o2 patients excluded), active phase of pulmonary rehabilitation (maintenance ok), previous lung surgery, QTc prolongation, HIV positive etc..   Key end points: Changes in respiratory symptoms and copd exacerbations and safety of DNX  Key features of DNX:   selective CXC chemokine receptor (CXCR2) antagonist . Positive trends in COPD exacerbation, symptoms and health status  Safety of DNX: As of April 2017 (5 completed Phase 1 studies, and 1 Phase 2A study of moderate copd patients with 45 patients):Marland Kitchen Most side effects are seen at 100mg  bid (current study max is 50mg  bid)  Phase 1 Normal Adults - dose </= 50mg  bid Study drug -    placebo  General All side effects mild - moderate. No changes in labs, EKG and vitals including PMN per PI meeting slide  x  Any adverse event 30% 13%  Headache 10-20% 0-17%   Fatigue/Lethargy 0-10% 0-3%  Abd Pain 0-11% (20% at 200mg  bid) 0%   Cough 0% 0%  Study withdrawal 0% 0%  SAE 0% 0%   - 3. COPD  COPD patients -data from IB April 2017 and PI meeting Spring 2017 Study drug Placebo  Efficacy  AECOPD and CAT scores better with drug after 1 year x  General No change in PMN, EKG, Vitals, Labs, Spirometry but produces mild to moderate headache and diarrhea  x  Any AE 52-77% 55-80%  Cystitis 5% at 75mg  bid 3%  Backpain 5% at 75mg  bid 0%  Nasopharyngitis 29% at 75mg  bid 33%  Headache 9% at 75mg  bid 8%  Upper abdominal pain 0% at 75mg  bid 6%  Infection rate 42% 50%  SAE 10 events 31 events   ........................................................................................................................................................................  [name], [name], DOB 1945-11-04, present today for Visit 3 on protocol, for research for the purpose of randomization.    Captured in this office visit encounter is the subject's ECG and Spirometry data in addition to typed notes regarding subject's physical exam by investigator (see subject's paper research chart for source of signed physical exam form).  See documentation in subject's paper research chart, as well as research encounter for Visit 3 (also dated today) for remaining documentation regarding the visit.  [name], RN Research Nurse Office [phone]

## 2016-09-12 NOTE — Progress Notes (Signed)
Title: Randomised, Double-Blind (Sponsor Open), Placebo-Controlled, Multicentre, Dose Ranging Study to Evaluate the Efficacy and Safety of Danirixin Tablets Administered Twice Daily Compared With Placebo for 24 Weeks in Adults With COPD  Sponsor: [employer] , Protoocol Number: [id number], NCT Trial #: [id number]  Synopsis:  Following baseline assessments collected over a 7 day period participants will be randomized (1:1:1:1:1:1) to receive one of five dose strengths of danirixin (5 milligram [mg], 10 mg, 25 mg, 35 mg and 50 mg) or placebo. Study treatment will be administered orally twice daily for 24 weeks. Participants will continue with their standard of care inhaled medications during study. Follow up will continue up to 28 days post last dose  Key Inclusion Criteria: age 54-80, COPD with fev1 >/= 40%, ratio <0.7, >/= 2 copd exacerbations in past 1 year treated with either antibiotics or steroids, but >/= 14 days since last antibiotic or steroid for exacerbation, >/= 10 pack smoking history  Key Exclusion Criteria: non-COPD lung disease, pulse ox < 88% at rest on room air (i.e, restting o2 patients excluded), active phase of pulmonary rehabilitation (maintenance ok), previous lung surgery, QTc prolongation, HIV positive etc..   Key end points: Changes in respiratory symptoms and copd exacerbations and safety of DNX  Key features of DNX:   selective CXC chemokine receptor (CXCR2) antagonist . Positive trends in COPD exacerbation, symptoms and health status  Safety of DNX: As of April 2017 (5 completed Phase 1 studies, and 1 Phase 2A study of moderate copd patients with 45 patients):Marland Kitchen Most side effects are seen at 187m bid (current study max is 54mbid)  Phase 1 Normal Adults - dose </= 5029mid Study drug -    placebo  General All side effects mild - moderate. No changes in labs, EKG and vitals including PMN per PI meeting slide  x  Any adverse event 30% 13%  Headache 10-20% 0-17%   Fatigue/Lethargy 0-10% 0-3%  Abd Pain 0-11% (20% at 200m62md) 0%   Cough 0% 0%  Study withdrawal 0% 0%  SAE 0% 0%   - 3. COPD  COPD patients -data from IB April 2017 and PI meeting Spring 2017 Study drug Placebo  Efficacy  AECOPD and CAT scores better with drug after 1 year x  General No change in PMN, EKG, Vitals, Labs, Spirometry but produces mild to moderate headache and diarrhea  x  Any AE 52-77% 55-80%  Cystitis 5% at 75mg69m 3%  Backpain 5% at 75mg 35m0%  Nasopharyngitis 29% at 75mg b11m3%  Headache 9% at 75mg bi48m  Upper abdominal pain 0% at 75mg bid69m Infection rate 42% 50%  SAE 10 events 31 events   ........................................................................................................................................................................  Patient Ramonia S JarrettROSEANA RHINE19/194Feb 15, 1947 ID [id number], [phone] today for Research Visit, Visit 3 on the above listed protocol, for the purpose of randomization, Day 1.  After subject arrival on site, this RN provided the subject with the newly released informed consent form and reviewed the changes.  See subject's paper research chart for documentation of the informed consent process.  Subject signed the updated consent form (revision date 29 August 2016) and agreed to ongoing participation.  Patient provided with a copy of signed consent form.  Patient reported utilizing new soap and cream for a rash (part of medical history), as well as hydrocortisone cream 2.5% to be applied BID for two weeks.  These were recommended by her dermatologist.  Patient also saw her primary care physician on 30/Nov/17  for ongoing bilateral hip pain that started in the summer of 2017.  The PCP diagnosed this as bilateral greater trochanteric bursitis and prescribed Naproxen to be taken PRN.  This was recorded as medical history, because diagnosis occurred prior to Visit 3 (per protocol).  All medications added to con  med log.  After re-consent, the subject had not been able to transmit any of the eDiary data.  See subject's paper research chart for documentation of ticket # with [employer] vendor.  All data was able to be transmitted, and patient had been compliant with completing diaries.   Subject continued on to complete Visit 3 questionnaires.  See subject's paper research chart for source documentation of vital signs, collected by [place] Bing.  Patient then completed remainder of protocol required procedures including ECG, spirometry, physical exam (by PI Ramaswamy), according to protocol and study reference manual instructions.    The subject was approved to be eligible for randomization, see documentation of correspondence with Medical Monitor Dr. Ellin Mayhew.  Patient was subsequently randomized.  This RN instructed the subject on appropriate administration of study medication (BID, with food (breakfast and evening meal) and followed with a glass of water).   The RAMOS NG kit assignment was confirmed by this RN and [place] Bing.  Both this RN and [place] Bing present to confirm correct first dosing.  Patient was provided with a snack of crackers and a glass of water for administration.  See paper research chart for source documentation of dosing time and kit assignment.  Patient experienced no adverse events following administration of study drug prior to leaving the research office.    Patient was instructed to take second dose for Day1 at approximately 8:00pm, with a snack and glass of water.  Patient confirmed she would do this and then tomorrow and onward would take the evening dose with her usually evening meal around 6:00pm.  Second dose of day 1 delayed to later in evening due to late morning dosing on day 1.  Patient verbalized understanding of how and when to appropriately take study medication.  Patient was provided with subject wallet card.  Patient verbalized understanding of what she  should do in case of an emergency (seek emergency care, then notify research staff).  Diane Martin, RN Research Nurse Office [phone]

## 2016-09-12 NOTE — Patient Instructions (Signed)
Research study patient - Plan: EKG 12-Lead, EKG 12-Lead, EKG 12-Lead, Spirometry with graph  Chronic obstructive pulmonary disease, unspecified COPD type ([place]) - Plan: EKG 12-Lead, EKG 12-Lead, EKG 12-Lead, Spirometry with graph  Per prtocol

## 2016-09-13 NOTE — Progress Notes (Signed)
Title: Randomised, Double-Blind (Sponsor Open), Placebo-Controlled, Multicentre, Dose Ranging Study to Evaluate the Efficacy and Safety of Danirixin Tablets Administered Twice Daily Compared With Placebo for 24 Weeks in Adults With COPD  Sponsor: [employer] , Protoocol Number: [id number], NCT Trial #: [id number]  Synopsis:  Following baseline assessments collected over a 7 day period participants will be randomized (1:1:1:1:1:1) to receive one of five dose strengths of danirixin (5 milligram [mg], 10 mg, 25 mg, 35 mg and 50 mg) or placebo. Study treatment will be administered orally twice daily for 24 weeks. Participants will continue with their standard of care inhaled medications during study. Follow up will continue up to 28 days post last dose  Key Inclusion Criteria: age 42-80, COPD with fev1 >/= 40%, ratio <0.7, >/= 2 copd exacerbations in past 1 year treated with either antibiotics or steroids, but >/= 14 days since last antibiotic or steroid for exacerbation, >/= 10 pack smoking history  Key Exclusion Criteria: non-COPD lung disease, pulse ox < 88% at rest on room air (i.e, restting o2 patients excluded), active phase of pulmonary rehabilitation (maintenance ok), previous lung surgery, QTc prolongation, HIV positive etc..   Key end points: Changes in respiratory symptoms and copd exacerbations and safety of DNX  Key features of DNX:   selective CXC chemokine receptor (CXCR2) antagonist . Positive trends in COPD exacerbation, symptoms and health status  Safety of DNX: As of April 2017 (5 completed Phase 1 studies, and 1 Phase 2A study of moderate copd patients with 45 patients):Marland Kitchen Most side effects are seen at '100mg'$  bid (current study max is '50mg'$  bid)  Phase 1 Normal Adults - dose </= '50mg'$  bid Study drug -    placebo  General All side effects mild - moderate. No changes in labs, EKG and vitals including PMN per PI meeting slide  x  Any adverse event 30% 13%  Headache 10-20% 0-17%   Fatigue/Lethargy 0-10% 0-3%  Abd Pain 0-11% (20% at '200mg'$  bid) 0%   Cough 0% 0%  Study withdrawal 0% 0%  SAE 0% 0%   - 3. COPD  COPD patients -data from IB April 2017 and PI meeting Spring 2017 Study drug Placebo  Efficacy  AECOPD and CAT scores better with drug after 1 year x  General No change in PMN, EKG, Vitals, Labs, Spirometry but produces mild to moderate headache and diarrhea  x  Any AE 52-77% 55-80%  Cystitis 5% at '75mg'$  bid 3%  Backpain 5% at '75mg'$  bid 0%  Nasopharyngitis 29% at '75mg'$  bid 33%  Headache 9% at '75mg'$  bid 8%  Upper abdominal pain 0% at '75mg'$  bid 6%  Infection rate 42% 50%  SAE 10 events 31 events   ........................................................................................................................................................................ [name], DOB 1946-02-24, subject ID [id number], is present today [phone] for research visit, Visit 3 on the above listed protocol for the purpose of randomization, Day 1.   Please refer to progress note by [name], RN for re-consent notes and procedures completed by that RN.   After re-consent and PRO questionnaires were completed (refer to note by [name], RN) this coordinator completed Vital signs per protocol and they are documented in the source chart. Following Vital signs, ECG and spirometry were completed by this coordinator per [place] protocol version 31/Oct/2017 and most updated SRM. Physical exam was completed by Dr. [name] (PI). Eligibility was verified with PI and Medical monitor. Blood work was obtained per protocol.  The RAMOS NG kit assignment was confirmed by this coordinator and [name],  RN. Both this coordinator and [name], RN were present and confirmed correct first dosing and witnessed the subject taking the first dose. Subject experienced no adverse events following administration of study drug prior to leaving the research office.   Subject  was instructed on when to complete her second dose, and how to continue what correct dosing for future dates by [name], RN. Subject verbalized understanding of all of the above.   [place] Bing, CMA, Study Coordinator

## 2016-09-15 ENCOUNTER — Encounter (HOSPITAL_COMMUNITY): Payer: Self-pay

## 2016-09-15 DIAGNOSIS — Z954 Presence of other heart-valve replacement: Secondary | ICD-10-CM | POA: Insufficient documentation

## 2016-09-15 DIAGNOSIS — R0600 Dyspnea, unspecified: Secondary | ICD-10-CM | POA: Insufficient documentation

## 2016-09-19 ENCOUNTER — Encounter: Payer: Medicare Other | Admitting: Cardiology

## 2016-09-20 ENCOUNTER — Encounter (HOSPITAL_COMMUNITY): Admission: RE | Admit: 2016-09-20 | Payer: Self-pay | Source: Ambulatory Visit

## 2016-09-22 ENCOUNTER — Other Ambulatory Visit: Payer: Self-pay | Admitting: Family Medicine

## 2016-09-22 ENCOUNTER — Encounter (HOSPITAL_COMMUNITY): Payer: Self-pay

## 2016-09-23 ENCOUNTER — Encounter: Payer: Medicare Other | Admitting: Cardiology

## 2016-09-26 ENCOUNTER — Encounter: Payer: Medicare Other | Admitting: *Deleted

## 2016-09-26 ENCOUNTER — Other Ambulatory Visit: Payer: Self-pay | Admitting: Cardiovascular Disease

## 2016-09-26 DIAGNOSIS — Z006 Encounter for examination for normal comparison and control in clinical research program: Secondary | ICD-10-CM

## 2016-09-26 DIAGNOSIS — J449 Chronic obstructive pulmonary disease, unspecified: Secondary | ICD-10-CM

## 2016-09-26 NOTE — Progress Notes (Signed)
Title: Randomised, Double-Blind (Sponsor Open), Placebo-Controlled, Multicentre, Dose Ranging Study to Evaluate the Efficacy and Safety of Danirixin Tablets Administered Twice Daily Compared With Placebo for 24 Weeks in Adults With COPD  Sponsor: [employer] , Protoocol Number: [id number], NCT Trial #: [id number]  Synopsis:  Following baseline assessments collected over a 7 day period participants will be randomized (1:1:1:1:1:1) to receive one of five dose strengths of danirixin (5 milligram [mg], 10 mg, 25 mg, 35 mg and 50 mg) or placebo. Study treatment will be administered orally twice daily for 24 weeks. Participants will continue with their standard of care inhaled medications during study. Follow up will continue up to 28 days post last dose  Key Inclusion Criteria: age 43-80, COPD with fev1 >/= 40%, ratio <0.7, >/= 2 copd exacerbations in past 1 year treated with either antibiotics or steroids, but >/= 14 days since last antibiotic or steroid for exacerbation, >/= 10 pack smoking history  Key Exclusion Criteria: non-COPD lung disease, pulse ox < 88% at rest on room air (i.e, restting o2 patients excluded), active phase of pulmonary rehabilitation (maintenance ok), previous lung surgery, QTc prolongation, HIV positive etc..   Key end points: Changes in respiratory symptoms and copd exacerbations and safety of DNX  Key features of DNX:   selective CXC chemokine receptor (CXCR2) antagonist . Positive trends in COPD exacerbation, symptoms and health status  Safety of DNX: As of April 2017 (5 completed Phase 1 studies, and 1 Phase 2A study of moderate copd patients with 45 patients):Marland Kitchen Most side effects are seen at 100mg  bid (current study max is 50mg  bid)  Phase 1 Normal Adults - dose </= 50mg  bid Study drug -    placebo  General All side effects mild - moderate. No changes in labs, EKG and vitals including PMN per PI meeting slide  x  Any adverse event 30% 13%  Headache 10-20% 0-17%   Fatigue/Lethargy 0-10% 0-3%  Abd Pain 0-11% (20% at 200mg  bid) 0%   Cough 0% 0%  Study withdrawal 0% 0%  SAE 0% 0%   - 3. COPD  COPD patients -data from IB April 2017 and PI meeting Spring 2017 Study drug Placebo  Efficacy  AECOPD and CAT scores better with drug after 1 year x  General No change in PMN, EKG, Vitals, Labs, Spirometry but produces mild to moderate headache and diarrhea  x  Any AE 52-77% 55-80%  Cystitis 5% at 75mg  bid 3%  Backpain 5% at 75mg  bid 0%  Nasopharyngitis 29% at 75mg  bid 33%  Headache 9% at 75mg  bid 8%  Upper abdominal pain 0% at 75mg  bid 6%  Infection rate 42% 50%  SAE 10 events 31 events   ........................................................................................................................................................................ Clinical Research Coordinator / Research RN note : This visit for [name] with DOB 1945/12/07 on 09/26/2016 for the above protocol is Visit 4  and is for purpose of research. The consent for this encounter is under Protocol Version Amendment 1 and is currently IRB approved. Subject expressed continued interest and consent in continuing as a study subject. Subject thanked for participation. The purpose and/or following were chief complaints for this encounter :  PRO Questionnaire Completion-see paper source study binder Drug Accountability Con Med review Medical History Review AE/SAE Review Lab Assessment (liver chemistry)  All of the above mentioned assessments were completed according to the above mentioned protocol.   Drug accountability performed and verified by both this study coordinator and [name], RN. See paper study source binder for details.  Medical history was updated to include cholecystectomy performed April 2016.   Con Med list was also updated with the addition of immodium 2mg  that the subject states is taken for occasional diarrhea. Subject states this occasional  diarrhea is baseline and is something she has experienced since post-op cholecystectomy in April 2016.    Subject describes experiencing a "runny nose" every morning and every evening since 06Dec2017. The subject denies taking any medications for this. The subject states she did not experience this symptom prior to [id number]. Adverse event information completed on adverse event log that is located in the subjects paper source study binder.   Signed by  [place] Bing, [place],   Research Coordinator  [facility]  [place], [place] 10:51 AM 09/26/2016

## 2016-09-27 ENCOUNTER — Encounter (HOSPITAL_COMMUNITY)
Admission: RE | Admit: 2016-09-27 | Discharge: 2016-09-27 | Disposition: A | Discharge status: Home / Self Care | Payer: Self-pay | Source: Ambulatory Visit | Attending: Internal Medicine | Admitting: Internal Medicine

## 2016-09-29 ENCOUNTER — Encounter (HOSPITAL_COMMUNITY)
Admission: RE | Admit: 2016-09-29 | Discharge: 2016-09-29 | Disposition: A | Discharge status: Home / Self Care | Payer: Self-pay | Source: Ambulatory Visit | Attending: Internal Medicine | Admitting: Internal Medicine

## 2016-09-30 ENCOUNTER — Telehealth: Payer: Self-pay | Admitting: Internal Medicine

## 2016-09-30 DIAGNOSIS — R7401 Elevation of levels of liver transaminase levels: Secondary | ICD-10-CM

## 2016-09-30 DIAGNOSIS — R740 Nonspecific elevation of levels of transaminase and lactic acid dehydrogenase [LDH]: Principal | ICD-10-CM

## 2016-09-30 NOTE — Telephone Encounter (Signed)
Title: Randomised, Double-Blind (Sponsor Open), Placebo-Controlled, Multicentre, Dose Ranging Study to Evaluate the Efficacy and Safety of Danirixin Tablets Administered Twice Daily Compared With Placebo for 24 Weeks in Adults With COPD  PI  NOTE  Discussed with [name] over phone 09/30/2016 11:03 AM for 15 minutes - [name] [birth year] latest lab  09/26/16 - AST 34, ALT 66 and alk phos 161. Normal bili. The latter are 2 are elevated compared to 09/12/16 was ALT 11.  We went through her med list and noted statin since 1990 without LFT problems, singular since 1995 and naproxen since 09/09/16 prn for bursitis and study drug since 09/12/16. So far no problems with LFT for study drug per my interpretatin of investigator brochure  A - Transaminitis - is an AE of mild severity. Unlikely related to study drug  P - [name] to call patient and advise and at same time elicit history  regarding - no tylenol if she is taking, no ethanol of any kind and if possible avoid the naproxyn -continue statin due to [facility] and long term intake without adversity (normal LFT in sept 2017 as well) - continue study drug if she is willing  - retest as unscheduled LFT check 09/30/2016 or immediately after xmas on 10/04/16 and beyoind. I    Dr. Brand Males, [name], F.C.C.P., ACRP-CPI Pulmonary and Critical Care Medicine Principal Investigator & Staff Physician [facility] [place] [facility]monary and Critical [facility] Pager: [phone], If no answer or between  15:00h - 7:00h: call [phone]  09/30/2016 11:00 AM

## 2016-09-30 NOTE — Telephone Encounter (Signed)
Title: Randomised, Double-Blind (Sponsor Open), Placebo-Controlled, Multicentre, Dose Ranging Study to Evaluate the Efficacy and Safety of Danirixin Tablets Administered Twice Daily Compared With Placebo for 24 Weeks in Adults With COPD  Sponsor: [employer] , Protoocol Number: [id number], NCT Trial #: [id number]  Synopsis:  Following baseline assessments collected over a 7 day period participants will be randomized (1:1:1:1:1:1) to receive one of five dose strengths of danirixin (5 milligram [mg], 10 mg, 25 mg, 35 mg and 50 mg) or placebo. Study treatment will be administered orally twice daily for 24 weeks. Participants will continue with their standard of care inhaled medications during study. Follow up will continue up to 28 days post last dose  Key Inclusion Criteria: age 31-80, COPD with fev1 >/= 40%, ratio <0.7, >/= 2 copd exacerbations in past 1 year treated with either antibiotics or steroids, but >/= 14 days since last antibiotic or steroid for exacerbation, >/= 10 pack smoking history  Key Exclusion Criteria: non-COPD lung disease, pulse ox < 88% at rest on room air (i.e, restting o2 patients excluded), active phase of pulmonary rehabilitation (maintenance ok), previous lung surgery, QTc prolongation, HIV positive etc..   Key end points: Changes in respiratory symptoms and copd exacerbations and safety of DNX  Key features of DNX:   selective CXC chemokine receptor (CXCR2) antagonist . Positive trends in COPD exacerbation, symptoms and health status  Safety of DNX: As of April 2017 (5 completed Phase 1 studies, and 1 Phase 2A study of moderate copd patients with 45 patients):Marland Kitchen Most side effects are seen at 100mg  bid (current study max is 50mg  bid)  Phase 1 Normal Adults - dose </= 50mg  bid Study drug -    placebo  General All side effects mild - moderate. No changes in labs, EKG and vitals including PMN per PI meeting slide  x  Any adverse event 30% 13%  Headache 10-20% 0-17%   Fatigue/Lethargy 0-10% 0-3%  Abd Pain 0-11% (20% at 200mg  bid) 0%   Cough 0% 0%  Study withdrawal 0% 0%  SAE 0% 0%   - 3. COPD  COPD patients -data from IB April 2017 and PI meeting Spring 2017 Study drug Placebo  Efficacy  AECOPD and CAT scores better with drug after 1 year x  General No change in PMN, EKG, Vitals, Labs, Spirometry but produces mild to moderate headache and diarrhea  x  Any AE 52-77% 55-80%  Cystitis 5% at 75mg  bid 3%  Backpain 5% at 75mg  bid 0%  Nasopharyngitis 29% at 75mg  bid 33%  Headache 9% at 75mg  bid 8%  Upper abdominal pain 0% at 75mg  bid 6%  Infection rate 42% 50%  SAE 10 events 31 events   ........................................................................................................................................................................  This RN called and spoke with patient over the phone.  Informed patient that lab work was a little elevated from prior lab work.  Patient confirmed that she has been feeling well.  Patient confirmed that she has not taken any tylenol, alcohol (though this time of year is difficult for her, so she may partake in some alcohol use), and the last dose of naproxen she took was on 05/Dec/17.  Patient has been occassionally taking ibuprofen 2 tabs Q6hr prn.    Informed patient of request to come in and repeat bloodwork.  Patient expressed frustration with request and firmly stated that she would not come in until Tuesday.  I confirmed that this was acceptable.  Patient plans to come in on Tuesday 26/Dec/17, around 13:30 for repeat blood work.  Patient advised to continue to stay away from use of tylenol, alcohol and naproxen.    [name], RN, BSN, [facility] [employer] [phone]

## 2016-09-30 NOTE — Telephone Encounter (Signed)
PI note  -Called subject  09/30/2016 4:46 PM - [name] - went over details mentioned. She will come in on 10/04/16 to do LFT. She understands the need   Dr. Brand Males, [name], [facility].C.P Pulmonary and Critical Care Medicine Staff Physician [place] Pulmonary and Critical Care Pager: [phone], If no answer or between  15:00h - 7:00h: call [phone]  09/30/2016 4:54 PM

## 2016-10-04 ENCOUNTER — Other Ambulatory Visit: Payer: Medicare Other

## 2016-10-04 ENCOUNTER — Encounter (HOSPITAL_COMMUNITY): Admission: RE | Admit: 2016-10-04 | Payer: Self-pay | Source: Ambulatory Visit

## 2016-10-04 DIAGNOSIS — R7401 Elevation of levels of liver transaminase levels: Secondary | ICD-10-CM

## 2016-10-04 DIAGNOSIS — R740 Nonspecific elevation of levels of transaminase and lactic acid dehydrogenase [LDH]: Principal | ICD-10-CM

## 2016-10-05 LAB — HEPATIC FUNCTION PANEL
ALT: 14 IU/L (ref 0–32)
AST: 22 IU/L (ref 0–40)
Albumin: 4 g/dL (ref 3.5–4.8)
Alkaline Phosphatase: 118 IU/L — ABNORMAL HIGH (ref 39–117)
Bilirubin Total: 0.7 mg/dL (ref 0.0–1.2)
Bilirubin, Direct: 0.21 mg/dL (ref 0.00–0.40)
Total Protein: 6.4 g/dL (ref 6.0–8.5)

## 2016-10-05 LAB — PLEASE NOTE

## 2016-10-06 ENCOUNTER — Encounter (HOSPITAL_COMMUNITY)
Admission: RE | Admit: 2016-10-06 | Discharge: 2016-10-06 | Disposition: A | Discharge status: Home / Self Care | Payer: Self-pay | Source: Ambulatory Visit | Attending: Internal Medicine | Admitting: Internal Medicine

## 2016-10-06 ENCOUNTER — Telehealth: Payer: Self-pay | Admitting: Internal Medicine

## 2016-10-06 NOTE — Telephone Encounter (Signed)
Pls let Shaketta S Bubel know that LFT normal - alk phios slight high but not clinically insigificant   Recent Labs Lab 10/04/16 0000  AST 22  ALT 14  ALKPHOS 118*  BILITOT 0.7  PROT 6.4  ALBUMIN 4.0    Dr. Brand Males, [name], [facility].C.P Pulmonary and Critical Care Medicine Staff Physician [place] [facility] and Critical [facility] Pager: [phone], If no answer or between  15:00h - 7:00h: call [phone]  10/06/2016 3:48 AM

## 2016-10-06 NOTE — Telephone Encounter (Signed)
I spoke with the patient today and advised her of lab results.

## 2016-10-11 ENCOUNTER — Encounter (HOSPITAL_COMMUNITY)
Admission: RE | Admit: 2016-10-11 | Discharge: 2016-10-11 | Disposition: A | Discharge status: Home / Self Care | Payer: Self-pay | Source: Ambulatory Visit | Attending: Internal Medicine | Admitting: Internal Medicine

## 2016-10-11 DIAGNOSIS — R0600 Dyspnea, unspecified: Secondary | ICD-10-CM | POA: Insufficient documentation

## 2016-10-11 DIAGNOSIS — Z954 Presence of other heart-valve replacement: Secondary | ICD-10-CM | POA: Insufficient documentation

## 2016-10-12 ENCOUNTER — Encounter: Payer: Medicare Other | Admitting: *Deleted

## 2016-10-12 ENCOUNTER — Other Ambulatory Visit: Payer: Self-pay | Admitting: Internal Medicine

## 2016-10-12 VITALS — BP 130/80 | HR 62 | Temp 97.9°F | Resp 11

## 2016-10-12 DIAGNOSIS — J449 Chronic obstructive pulmonary disease, unspecified: Secondary | ICD-10-CM

## 2016-10-12 DIAGNOSIS — Z006 Encounter for examination for normal comparison and control in clinical research program: Secondary | ICD-10-CM

## 2016-10-12 NOTE — Progress Notes (Signed)
Title: Randomised, Double-Blind (Sponsor Open), Placebo-Controlled, Multicentre, Dose Ranging Study to Evaluate the Efficacy and Safety of Danirixin Tablets Administered Twice Daily Compared With Placebo for 24 Weeks in Adults With COPD  Sponsor: [employer] , Protoocol Number: [id number], NCT Trial #: [id number]  Synopsis:  Following baseline assessments collected over a 7 day period participants will be randomized (1:1:1:1:1:1) to receive one of five dose strengths of danirixin (5 milligram [mg], 10 mg, 25 mg, 35 mg and 50 mg) or placebo. Study treatment will be administered orally twice daily for 24 weeks. Participants will continue with their standard of care inhaled medications during study. Follow up will continue up to 28 days post last dose  Key Inclusion Criteria: age 78-80, COPD with fev1 >/= 40%, ratio <0.7, >/= 2 copd exacerbations in past 1 year treated with either antibiotics or steroids, but >/= 14 days since last antibiotic or steroid for exacerbation, >/= 10 pack smoking history  Key Exclusion Criteria: non-COPD lung disease, pulse ox < 88% at rest on room air (i.e, restting o2 patients excluded), active phase of pulmonary rehabilitation (maintenance ok), previous lung surgery, QTc prolongation, HIV positive etc..   Key end points: Changes in respiratory symptoms and copd exacerbations and safety of DNX  Key features of DNX:   selective CXC chemokine receptor (CXCR2) antagonist . Positive trends in COPD exacerbation, symptoms and health status  Safety of DNX: As of April 2017 (5 completed Phase 1 studies, and 1 Phase 2A study of moderate copd patients with 45 patients):Marland Kitchen Most side effects are seen at 100mg  bid (current study max is 50mg  bid)  Phase 1 Normal Adults - dose </= 50mg  bid Study drug -    placebo  General All side effects mild - moderate. No changes in labs, EKG and vitals including PMN per PI meeting slide  x  Any adverse event 30% 13%  Headache 10-20% 0-17%   Fatigue/Lethargy 0-10% 0-3%  Abd Pain 0-11% (20% at 200mg  bid) 0%   Cough 0% 0%  Study withdrawal 0% 0%  SAE 0% 0%   - 3. COPD  COPD patients -data from IB April 2017 and PI meeting Spring 2017 Study drug Placebo  Efficacy  AECOPD and CAT scores better with drug after 1 year x  General No change in PMN, EKG, Vitals, Labs, Spirometry but produces mild to moderate headache and diarrhea  x  Any AE 52-77% 55-80%  Cystitis 5% at 75mg  bid 3%  Backpain 5% at 75mg  bid 0%  Nasopharyngitis 29% at 75mg  bid 33%  Headache 9% at 75mg  bid 8%  Upper abdominal pain 0% at 75mg  bid 6%  Infection rate 42% 50%  SAE 10 events 31 events   ........................................................................................................................................................................ Clinical Research Coordinator / Research RN note : This visit for [name] with DOB 1946/08/07 on 10/12/2016 for the above protocol is Visit/Encounter # 5  and is for purpose of research . The consent for this encounter is under Protocol Version Amendment 1 and is currently IRB approved. Subject expressed continued interest and consent in continuing as a study subject. Subject thanked for participation. The purpose and/or following were chief complaints for this encounter : research follow-up visit.   This coordinator completed all required assessment and procedures according to the above mentioned protocol. Refer to the subjects paper source binder for details.    Drug accountability performed and verified by both this study coordinator and [name], RN. Refer to documentation in the subjects paper source binder.   Subject has  a history of occasional headaches but today she described now having a "different" type of headache then her baseline. She describes the headaches as occurring on the back of her head vs. Baseline headaches were always located in the front of her head above her eyes.  Subject states this has been occurring for 3 weeks. She describes the headaches as intermittent. She states the headache pain is not worse, just the location is different. Subject states the "pain is not bad, just annoying." Refer to AE log in paper source binder for further documentation.    Prior AE of transaminitis has resolved as of 26Dec17 and per PI, Dr. Brand Males, subject is ok to resume taking all medications as previously prescribed, including any medication pain medication.   Subject stated she will be having cataract surgery on the left eye on 09Jan2018. The eye surgeon advised the subject to hold all medications the morning of surgery. Spoke with PI, Dr. Brand Males, and he advised due to medical safety the subject should hold the morning dose of study drug and only take the PM dose the day of surgery. Subject advised and stated understanding.   ECG performed per protocol. Dr. [name], PI, personally reviewed the ECG through the subjects electronic medical chart and advised that it is abnormal/not clinically significant, and ok to proceed with completion of the visit.  ECG report filed in subjects paper source binder.     Signed by  [name], [place] Coordinator  [facility]  [place], [place] 12:18 PM 10/12/2016

## 2016-10-13 ENCOUNTER — Encounter (HOSPITAL_COMMUNITY)
Admission: RE | Admit: 2016-10-13 | Discharge: 2016-10-13 | Disposition: A | Discharge status: Home / Self Care | Payer: Self-pay | Source: Ambulatory Visit | Attending: Internal Medicine | Admitting: Internal Medicine

## 2016-10-17 ENCOUNTER — Encounter: Payer: Medicare Other | Admitting: Cardiology

## 2016-10-18 ENCOUNTER — Encounter (HOSPITAL_COMMUNITY): Payer: Self-pay

## 2016-10-18 HISTORY — PX: CATARACT EXTRACTION W/ INTRAOCULAR LENS IMPLANT: SHX1309

## 2016-10-20 ENCOUNTER — Encounter (HOSPITAL_COMMUNITY): Payer: Self-pay

## 2016-10-23 ENCOUNTER — Other Ambulatory Visit: Payer: Self-pay | Admitting: Family Medicine

## 2016-10-23 ENCOUNTER — Other Ambulatory Visit: Payer: Self-pay | Admitting: Cardiovascular Disease

## 2016-10-23 DIAGNOSIS — N183 Chronic kidney disease, stage 3 unspecified: Secondary | ICD-10-CM

## 2016-10-23 DIAGNOSIS — I10 Essential (primary) hypertension: Secondary | ICD-10-CM

## 2016-10-23 DIAGNOSIS — E559 Vitamin D deficiency, unspecified: Secondary | ICD-10-CM

## 2016-10-23 DIAGNOSIS — E785 Hyperlipidemia, unspecified: Secondary | ICD-10-CM

## 2016-10-23 HISTORY — DX: Vitamin D deficiency, unspecified: E55.9

## 2016-10-24 ENCOUNTER — Ambulatory Visit: Payer: Medicare Other | Admitting: Internal Medicine

## 2016-10-24 ENCOUNTER — Encounter: Payer: Self-pay | Admitting: Cardiology

## 2016-10-24 ENCOUNTER — Other Ambulatory Visit: Payer: Medicare Other

## 2016-10-24 ENCOUNTER — Ambulatory Visit (INDEPENDENT_AMBULATORY_CARE_PROVIDER_SITE_OTHER): Payer: Medicare Other | Admitting: Cardiology

## 2016-10-24 VITALS — BP 136/72 | HR 64 | Ht 66.0 in | Wt 217.0 lb

## 2016-10-24 DIAGNOSIS — Z950 Presence of cardiac pacemaker: Secondary | ICD-10-CM

## 2016-10-24 DIAGNOSIS — Z45018 Encounter for adjustment and management of other part of cardiac pacemaker: Secondary | ICD-10-CM | POA: Diagnosis not present

## 2016-10-24 DIAGNOSIS — R001 Bradycardia, unspecified: Secondary | ICD-10-CM

## 2016-10-24 NOTE — Telephone Encounter (Signed)
Rx(s) sent to pharmacy electronically.  

## 2016-10-24 NOTE — Progress Notes (Addendum)
Electrophysiology Office Note   Date:  10/24/2016   ID:  Yalini, [name] 1946-05-23, MRN [id number]  PCP:  Ria Bush, MD  Cardiologist:  [facility] Primary Electrophysiologist:  Aryan Bello Meredith Leeds, MD    Chief Complaint  Patient presents with  . Pacemaker Check    91 days post implanr     History of Present Illness: Abrial IRHA ARNET is a 71 y.o. female who presents today for electrophysiology evaluation.   She presented to the hospital in September 2017 with progressive exercise intolerance and shortness of breath and was found to be in 2 to one AV block. She had been off her beta blocker for 48 hours and had a St. Jude MRI compatible pacemaker placed. She has been feeling well without any major complaints. She does get short of breath with exertion, but she feels like this is improved since starting pulmonary rehabilitation.  Today, she denies symptoms of palpitations, chest pain,  orthopnea, PND, lower extremity edema, claudication, dizziness, presyncope, syncope, bleeding, or neurologic sequela. The patient is tolerating medications without difficulties and is otherwise without complaint today.    Past Medical History:  Diagnosis Date  . Acute cystitis without hematuria 12/22/2015  . Anemia   . Arthritis   . Asthma   . Cataracts, bilateral    immature  . Chronic diastolic heart failure ([place]) 09/19/2014  . Chronic insomnia   . Constipation    takes Miralax daily as needed  . COPD (chronic obstructive pulmonary disease) (H[facility])    Albuterol inhaler daily as needed;Duoneb daily as needed;Spiriva daily  . DDD (degenerative disc disease)    cervical - kyphosis with mod DD changes C5/6 and C6/7; lumbar - early DD at L2/3 ([place])  . Essential hypertension    was on meds but after appointment Dec 11 with Dr.C he took her off  . GERD (gastroesophageal reflux disease)    takes Protonix daily  . History of radiation therapy 05/30/11 to 07/07/11   rectum  . History of  shingles   . Hyperlipemia    takes Lovastatin daily  . Lung nodule    RLL nodule-9mm stable 2006, April 2009, and June 2009  . Obesity   . Osteopenia 12/2014   T -1.5 hip  . Pancreatitis 11/2014   ?zpack related vs gallstone pancreatitis with abnormal HIDA scan pending cholecystectomy  . Rectal cancer ([place]) 04/2011   T3N0; s/p lap LAR, s/p ileostomy, s/p reversal, s/p chemo  . Rheumatic heart disease mitral stenosis    mod MS by echo 02/2014  . S/P AVR (aortic valve replacement) 2015   bioprosthetic ([place])  . S/P MVR (mitral valve replacement) 2015   bioprosthetic ([place])  . Seizures ([place])    febrile seizures as a child  . Severe aortic valve stenosis  AVR 3/15 10/01/2013   mild-mod by echo 02/2014  . Sleep apnea    PT TOLD BORDERLINE-TRIED CPAP-DID NOT HELP-SHE DOES NOT USE CPAP  . Small bowel obstruction, partial 08/2013   reolved without NGT placement.   . Vitamin B12 deficiency 02/28/2015   Start B12 shots 02/2015    Past Surgical History:  Procedure Laterality Date  . AORTIC VALVE REPLACEMENT N/A 12/12/2013   Procedure: AORTIC VALVE REPLACEMENT (AVR);  Surgeon: Gaye Pollack, MD; Service: Open Heart Surgery  . BACK SURGERY  2006, 2007   2006 SPACER, 2007 decompression and fusion L4/5  . BOWEL RESECTION  04/02/2012   Procedure: SMALL BOWEL RESECTION;  Surgeon: Stark Klein, MD;  Location: [facility];  Service: General;  Laterality: N/A;  . CARPAL TUNNEL RELEASE Bilateral   . CHOLECYSTECTOMY N/A 01/21/2015   chronic cholecystitis, Stark Klein, MD  . COLON RESECTION  08/25/2011   Procedure: COLON RESECTION LAPAROSCOPIC;  Surgeon: Stark Klein, MD;  Location: [facility];  Service: General;  Laterality: N/A;  Laparoscopic Assisted Low Anterior Resection Diverting Ostomy and onQ pain pump  . COLONOSCOPY  03/2013   1 polyp, rpt 3 yrs Ardis Hughs)  . COLONOSCOPY WITH PROPOFOL N/A 06/09/2016   Procedure: COLONOSCOPY WITH PROPOFOL;  Surgeon: [name], MD;  Location: [facility];  Service:  Endoscopy;  Laterality: N/A;  . EP IMPLANTABLE DEVICE N/A 06/16/2016   Procedure: Pacemaker Implant;  Surgeon: Oliviarose Punch Meredith Leeds, MD;  Location: [facility];  Service: Cardiovascular;  Laterality: N/A;  . FOOT SURGERY  left foot   hammer toe  . HAND SURGERY  Bil   carpal tunnel  . ILEOSTOMY  08/25/2011  . ILEOSTOMY CLOSURE  04/02/2012   Procedure: ILEOSTOMY TAKEDOWN;  Surgeon: Stark Klein, MD;  Location: [facility];  Service: General;  Laterality: N/A;  . INTRAOPERATIVE TRANSESOPHAGEAL ECHOCARDIOGRAM N/A 12/12/2013   Procedure: INTRAOPERATIVE TRANSESOPHAGEAL ECHOCARDIOGRAM;  Surgeon: Gaye Pollack, MD;  Location: [facility];  Service: Open Heart Surgery;  Laterality: N/A;  . LEFT AND RIGHT HEART CATHETERIZATION WITH CORONARY ANGIOGRAM N/A 11/27/2013   Procedure: LEFT AND RIGHT HEART CATHETERIZATION WITH CORONARY ANGIOGRAM;  Surgeon: Sanda Klein, MD;  Location: [facility];  Service: Cardiovascular;  Laterality: N/A;  . MITRAL VALVE REPLACEMENT N/A 12/12/2013   Procedure: MITRAL VALVE (MV) REPLACEMENT OR REPAIR;  Surgeon: Gaye Pollack, MD; Service: Open Heart Surgery  . TEE WITHOUT CARDIOVERSION N/A 11/29/2013   Procedure: TRANSESOPHAGEAL ECHOCARDIOGRAM (TEE);  Surgeon: Sueanne Margarita, MD;  Location: [facility];  Service: Cardiovascular;  Laterality: N/A;  . TONSILLECTOMY       Current Outpatient Prescriptions  Medication Sig Dispense Refill  . albuterol (PROVENTIL HFA;VENTOLIN HFA) 108 (90 Base) MCG/ACT inhaler Inhale 2 puffs into the lungs every 6 (six) hours as needed for wheezing. 1 Inhaler 2  . amoxicillin (AMOXIL) 500 MG capsule take 4 capsules by mouth 30-60 MINUTES PRIOR TO DENTAL APPT 4 capsule 12  . aspirin 81 MG tablet Take 81 mg by mouth at bedtime.     Marland Kitchen BIOTIN PO Take 1 tablet by mouth at bedtime.     . bisoprolol (ZEBETA) 5 MG tablet take 1/2 tablet by mouth once daily 15 tablet 11  . BREO ELLIPTA 200-25 MCG/INH AEPB inhale 1 puff INTO THE LUNGS daily 60 each 6  . Calcium  Carbonate-Vitamin D (CALCIUM 600 + D PO) Take 600 mg by mouth at bedtime.     . Cholecalciferol (VITAMIN D) 2000 UNITS CAPS Take 2,000 Units by mouth at bedtime.     . furosemide (LASIX) 40 MG tablet Take one tablet by mouth daily 5 times a week. 25 tablet 11  . ipratropium-albuterol (DUONEB) 0.5-2.5 (3) MG/3ML SOLN Take 3 mLs by nebulization every 6 (six) hours as needed. (Patient taking differently: Take 3 mLs by nebulization every 6 (six) hours as needed (for wheezing or shortness of breath). ) 360 mL 0  . lovastatin (MEVACOR) 40 MG tablet take 1 tablet by mouth at bedtime 30 tablet 3  . Miconazole Nitrate 2 % POWD Apply 1 application topically daily as needed (for skin). Apply to stomach and under breasts    . montelukast (SINGULAIR) 10 MG tablet take 1 tablet  by mouth at bedtime 30 tablet 5  . pantoprazole (PROTONIX) 40 MG tablet take 1 tablet by mouth at bedtime 90 tablet 1  . polycarbophil (FIBERCON) 625 MG tablet Take 625 mg by mouth daily.    . polyethylene glycol (MIRALAX / GLYCOLAX) packet Take 17 g by mouth daily. 14 each 0  . potassium chloride (K-DUR) 10 MEQ tablet take 2 tablets by mouth once daily (Patient taking differently: take 2 tablets by mouth once daily ( 5 days a week)) 60 tablet 11  . SPIRIVA HANDIHALER 18 MCG inhalation capsule inhale the contents of one capsule in the handihaler once daily 30 capsule 5  . traMADol (ULTRAM) 50 MG tablet Take 1 tablet (50 mg total) by mouth every 6 (six) hours as needed for moderate pain. 30 tablet 0  . traZODone (DESYREL) 50 MG tablet Take 1 tablet (50 mg total) by mouth at bedtime. 30 tablet 11  . triamcinolone cream (KENALOG) 0.1 % Apply 1 application topically 2 (two) times daily. Apply to AA. 45 g 0  . vitamin B-12 (CYANOCOBALAMIN) 500 MCG tablet Take 500 mcg by mouth daily.     No current facility-administered medications for this visit.     Allergies:   Codeine; Prednisone; and Sulfonamide derivatives   Social History:  The  patient  reports that she quit smoking about 41 years ago. Her smoking use included Cigarettes. She has a 15.00 pack-year smoking history. She has never used smokeless tobacco. She reports that she drinks alcohol. She reports that she does not use drugs.   Family History:  The patient's family history includes CAD (age of onset: 45) in her son; Diabetes in her mother; Heart attack in her maternal grandfather; Heart attack (age of onset: 71) in her son; Heart disease in her mother; Hypertension in her mother; Lung cancer in her father; Stroke in her mother.    ROS:  Please see the history of present illness.   Otherwise, review of systems is positive for SOB, wheezing.   All other systems are reviewed and negative.    PHYSICAL EXAM: VS:  BP 136/72   Pulse 64   Ht 5\' 6"  (1.676 m)   Wt 217 lb (98.4 kg)   BMI 35.02 kg/m  , BMI Body mass index is 35.02 kg/m. GEN: Well nourished, well developed, in no acute distress  HEENT: normal  Neck: no JVD, carotid bruits, or masses Cardiac: RRR; 2/6 systolic murmur LUSB, rubs, or gallops,no edema  Respiratory:  clear to auscultation bilaterally, normal work of breathing GI: soft, nontender, nondistended, + BS MS: no deformity or atrophy  Skin: warm and dry,  device pocket is well healed Neuro:  Strength and sensation are intact Psych: euthymic mood, full affect  EKG:  EKG is ordered today. Personal review of the ekg ordered shows sinus rhythm, right bundle branch block, left anterior fascicular block, rate 67  Device interrogation is reviewed today in detail.  See PaceArt for details.   Recent Labs: 06/16/2016: BUN 15; Creatinine, Ser 1.05; Hemoglobin 11.1; Platelets 121; Potassium 3.5; Sodium 139 10/04/2016: ALT 14    Lipid Panel     Component Value Date/Time   CHOL 154 10/23/2015 0903   TRIG 62.0 10/23/2015 0903   HDL 49.70 10/23/2015 0903   CHOLHDL 3 10/23/2015 0903   VLDL 12.4 10/23/2015 0903   LDLCALC 91 10/23/2015 0903   LDLDIRECT  111.0 06/26/2015 0928     Wt Readings from Last 3 Encounters:  10/24/16 217 lb (98.4 kg)  09/08/16 212 lb 8 oz (96.4 kg)  08/26/16 210 lb (95.3 kg)      Other studies Reviewed: Additional studies/ records that were reviewed today include: TTE 06/29/16  Review of the above records today demonstrates:  - Left ventricle: The cavity size was normal. Wall thickness was   increased in a pattern of mild LVH. Systolic function was normal.   The estimated ejection fraction was in the range of 60% to 65%.   Indeterminant diastolic function. Wall motion was normal; there   were no regional wall motion abnormalities. - Aortic valve: There was a bioprosthetic aortic valve. There did   not appear to be significant regurgitation or stenosis. Mean   gradient (S): 17 mm Hg. - Mitral valve: There was a bioprosthetic mitral valve. There did   not appear to be significant stenosis or regurgitation. The   residual native mitral apparatus is severely calcified. Mean   gradient (D): 6 mm Hg. Valve area by pressure half-time: 1.8   cm^2. - Left atrium: The atrium was mildly dilated. - Right ventricle: The cavity size was normal. Systolic function   was normal. - Tricuspid valve: Peak RV-RA gradient (S): 23 mm Hg. - Pulmonary arteries: PA peak pressure: 26 mm Hg (S). - Inferior vena cava: The vessel was normal in size. The   respirophasic diameter changes were in the normal range (= 50%),   consistent with normal central venous pressure.   ASSESSMENT AND PLAN:  1.  2:1 AV block: Status post St. Jude dual-chamber pacemaker. Functioning appropriately. Changes in the device made for her management. She is conducting today and therefore we have turned on VIP  2. Diastolic heart failure: Doing well without shortness of breath. Continue bisoprolol, Lasix.  3. Rheumatic heart disease: Status post mitral and aortic valve replacement. Valves functioning appropriately on most recent echocardiogram  4.  Hyperlipidemia: Continue lovastatin  Current medicines are reviewed at length with the patient today.   The patient does not have concerns regarding her medicines.  The following changes were made today:  none  Labs/ tests ordered today include:  Orders Placed This Encounter  Procedures  . EKG 12-Lead     Disposition:   FU with Lenah Messenger 8 months  Signed, Alaze Garverick Meredith Leeds, MD  10/24/2016 2:42 PM     [facility] [address] [place] [address] [phone] (office) [phone] (fax)

## 2016-10-24 NOTE — Patient Instructions (Signed)
Medication Instructions:    Your physician recommends that you continue on your current medications as directed. Please refer to the Current Medication list given to you today.  --- If you need a refill on your cardiac medications before your next appointment, please call your pharmacy. ---  Labwork:  None ordered  Testing/Procedures:  None ordered  Follow-Up: Remote monitoring is used to monitor your Pacemaker of ICD from home. This monitoring reduces the number of office visits required to check your device to one time per year. It allows [place] to keep an eye on the functioning of your device to ensure it is working properly. You are scheduled for a device check from home on 01/23/2017. You may send your transmission at any time that day. If you have a wireless device, the transmission will be sent automatically. After your physician reviews your transmission, you will receive a postcard with your next transmission date.   Your physician wants you to follow-up in: September 2018 with Dr. Curt Bears.  You will receive a reminder letter in the mail two months in advance. If you don't receive a letter, please call our office to schedule the follow-up appointment.  Thank you for choosing [facility]!!   Trinidad Curet, RN [phone]

## 2016-10-25 ENCOUNTER — Telehealth (HOSPITAL_COMMUNITY): Payer: Self-pay | Admitting: Family Medicine

## 2016-10-25 ENCOUNTER — Encounter (HOSPITAL_COMMUNITY): Payer: Self-pay

## 2016-10-25 NOTE — Telephone Encounter (Signed)
Pt called to cancel due to virus... KJ

## 2016-10-26 ENCOUNTER — Encounter: Payer: Self-pay | Admitting: Family Medicine

## 2016-10-26 ENCOUNTER — Ambulatory Visit: Payer: Medicare Other

## 2016-10-27 ENCOUNTER — Encounter (HOSPITAL_COMMUNITY): Payer: Self-pay

## 2016-10-27 ENCOUNTER — Encounter: Payer: Self-pay | Admitting: Family Medicine

## 2016-10-27 LAB — CUP PACEART INCLINIC DEVICE CHECK
Battery Voltage: 3.01 V
Brady Statistic RA Percent Paced: 48 %
Brady Statistic RV Percent Paced: 98 %
Date Time Interrogation Session: 20180115192300
Implantable Lead Implant Date: 20170907
Implantable Lead Implant Date: 20170907
Implantable Lead Location: 753859
Implantable Lead Location: 753860
Implantable Pulse Generator Implant Date: 20170907
Lead Channel Impedance Value: 487.5 Ohm
Lead Channel Impedance Value: 625 Ohm
Lead Channel Pacing Threshold Amplitude: 0.5 V
Lead Channel Pacing Threshold Amplitude: 0.75 V
Lead Channel Pacing Threshold Pulse Width: 0.5 ms
Lead Channel Pacing Threshold Pulse Width: 0.5 ms
Lead Channel Sensing Intrinsic Amplitude: 12 mV
Lead Channel Sensing Intrinsic Amplitude: 4.1 mV
Lead Channel Setting Pacing Amplitude: 0.75 V
Lead Channel Setting Pacing Amplitude: 2 V
Lead Channel Setting Pacing Pulse Width: 0.5 ms
Lead Channel Setting Sensing Sensitivity: 2 mV
Pulse Gen Model: 2272
Pulse Gen Serial Number: 7945283

## 2016-10-28 ENCOUNTER — Encounter: Payer: Medicare Other | Admitting: Family Medicine

## 2016-10-31 NOTE — Progress Notes (Signed)
Thank you, [name].

## 2016-11-01 ENCOUNTER — Encounter (HOSPITAL_COMMUNITY)
Admission: RE | Admit: 2016-11-01 | Discharge: 2016-11-01 | Disposition: A | Discharge status: Home / Self Care | Payer: Self-pay | Source: Ambulatory Visit | Attending: Internal Medicine | Admitting: Internal Medicine

## 2016-11-03 ENCOUNTER — Encounter (HOSPITAL_COMMUNITY)
Admission: RE | Admit: 2016-11-03 | Discharge: 2016-11-03 | Disposition: A | Discharge status: Home / Self Care | Payer: Self-pay | Source: Ambulatory Visit | Attending: Internal Medicine | Admitting: Internal Medicine

## 2016-11-07 ENCOUNTER — Ambulatory Visit (INDEPENDENT_AMBULATORY_CARE_PROVIDER_SITE_OTHER): Payer: Medicare Other

## 2016-11-07 VITALS — BP 124/70 | HR 60 | Temp 98.1°F | Ht 66.0 in | Wt 216.0 lb

## 2016-11-07 DIAGNOSIS — E559 Vitamin D deficiency, unspecified: Secondary | ICD-10-CM | POA: Diagnosis not present

## 2016-11-07 DIAGNOSIS — Z0000 Encounter for general adult medical examination without abnormal findings: Secondary | ICD-10-CM

## 2016-11-07 DIAGNOSIS — E785 Hyperlipidemia, unspecified: Secondary | ICD-10-CM | POA: Diagnosis not present

## 2016-11-07 DIAGNOSIS — N183 Chronic kidney disease, stage 3 unspecified: Secondary | ICD-10-CM

## 2016-11-07 LAB — CBC WITH DIFFERENTIAL/PLATELET
Basophils Absolute: 0.1 10*3/uL (ref 0.0–0.1)
Basophils Relative: 1.1 % (ref 0.0–3.0)
Eosinophils Absolute: 0.1 10*3/uL (ref 0.0–0.7)
Eosinophils Relative: 2.4 % (ref 0.0–5.0)
HCT: 39.7 % (ref 36.0–46.0)
Hemoglobin: 12.9 g/dL (ref 12.0–15.0)
Lymphocytes Relative: 15.4 % (ref 12.0–46.0)
Lymphs Abs: 0.8 10*3/uL (ref 0.7–4.0)
MCHC: 32.5 g/dL (ref 30.0–36.0)
MCV: 89.7 fl (ref 78.0–100.0)
Monocytes Absolute: 0.7 10*3/uL (ref 0.1–1.0)
Monocytes Relative: 12.5 % — ABNORMAL HIGH (ref 3.0–12.0)
Neutro Abs: 3.7 10*3/uL (ref 1.4–7.7)
Neutrophils Relative %: 68.6 % (ref 43.0–77.0)
Platelets: 174 10*3/uL (ref 150.0–400.0)
RBC: 4.42 Mil/uL (ref 3.87–5.11)
RDW: 15.7 % — ABNORMAL HIGH (ref 11.5–15.5)
WBC: 5.5 10*3/uL (ref 4.0–10.5)

## 2016-11-07 LAB — COMPREHENSIVE METABOLIC PANEL
ALT: 11 U/L (ref 0–35)
AST: 21 U/L (ref 0–37)
Albumin: 3.8 g/dL (ref 3.5–5.2)
Alkaline Phosphatase: 72 U/L (ref 39–117)
BUN: 20 mg/dL (ref 6–23)
CO2: 32 mEq/L (ref 19–32)
Calcium: 9.5 mg/dL (ref 8.4–10.5)
Chloride: 101 mEq/L (ref 96–112)
Creatinine, Ser: 1.1 mg/dL (ref 0.40–1.20)
GFR: 51.99 mL/min — ABNORMAL LOW (ref >60.00)
Glucose, Bld: 93 mg/dL (ref 70–99)
Potassium: 4 mEq/L (ref 3.5–5.1)
Sodium: 139 mEq/L (ref 135–145)
Total Bilirubin: 0.6 mg/dL (ref 0.2–1.2)
Total Protein: 6.6 g/dL (ref 6.0–8.3)

## 2016-11-07 LAB — TSH: TSH: 1.8 u[IU]/mL (ref 0.35–4.50)

## 2016-11-07 LAB — VITAMIN D 25 HYDROXY (VIT D DEFICIENCY, FRACTURES): VITD: 36.02 ng/mL (ref 30.00–100.00)

## 2016-11-07 LAB — LIPID PANEL
Cholesterol: 186 mg/dL (ref 0–200)
HDL: 54 mg/dL (ref >39.00)
LDL Cholesterol: 105 mg/dL — ABNORMAL HIGH (ref 0–99)
NonHDL: 132.24
Total CHOL/HDL Ratio: 3
Triglycerides: 138 mg/dL (ref 0.0–149.0)
VLDL: 27.6 mg/dL (ref 0.0–40.0)

## 2016-11-07 NOTE — Patient Instructions (Signed)
[name] , Thank you for taking time to come for your Medicare Wellness Visit. I appreciate your ongoing commitment to your health goals. Please review the following plan we discussed and let me know if I can assist you in the future.   These are the goals we discussed: Goals    . Increase physical activity          Starting 11/07/2016, I will continue to exercise at least 60 min twice weekly.        This is a list of the screening recommended for you and due dates:  Health Maintenance  Topic Date Due  . DTaP/Tdap/Td vaccine (1 - Tdap) 10/10/2025*  . Tetanus Vaccine  10/10/2025*  . Mammogram  02/09/2018  . Colon Cancer Screening  06/10/2019  . Flu Shot  Completed  . DEXA scan (bone density measurement)  Completed  . Shingles Vaccine  Completed  .  Hepatitis C: One time screening is recommended by Center for Disease Control  (CDC) for  adults born from [birth year] through 1966.   Completed  . Pneumonia vaccines  Completed  *Topic was postponed. The date shown is not the original due date.   Preventive Care for Adults  A healthy lifestyle and preventive care can promote health and wellness. Preventive health guidelines for adults include the following key practices.  . A routine yearly physical is a good way to check with your health care provider about your health and preventive screening. It is a chance to share any concerns and updates on your health and to receive a thorough exam.  . Visit your dentist for a routine exam and preventive care every 6 months. Brush your teeth twice a day and floss once a day. Good oral hygiene prevents tooth decay and gum disease.  . The frequency of eye exams is based on your age, health, family medical history, use  of contact lenses, and other factors. Follow your health care provider's ecommendations for frequency of eye exams.  . Eat a healthy diet. Foods like vegetables, fruits, whole grains, low-fat dairy products, and lean protein foods  contain the nutrients you need without too many calories. Decrease your intake of foods high in solid fats, added sugars, and salt. Eat the right amount of calories for you. Get information about a proper diet from your health care provider, if necessary.  . Regular physical exercise is one of the most important things you can do for your health. Most adults should get at least 150 minutes of moderate-intensity exercise (any activity that increases your heart rate and causes you to sweat) each week. In addition, most adults need muscle-strengthening exercises on 2 or more days a week.  Silver Sneakers may be a benefit available to you. To determine eligibility, you may visit the website: www.silversneakers.com or contact program at 9365865301 Mon-Fri between 8AM-8PM.   . Maintain a healthy weight. The body mass index (BMI) is a screening tool to identify possible weight problems. It provides an estimate of body fat based on height and weight. Your health care provider can find your BMI and can help you achieve or maintain a healthy weight.   For adults 20 years and older: ? A BMI below 18.5 is considered underweight. ? A BMI of 18.5 to 24.9 is normal. ? A BMI of 25 to 29.9 is considered overweight. ? A BMI of 30 and above is considered obese.   . Maintain normal blood lipids and cholesterol levels by exercising and minimizing your  intake of saturated fat. Eat a balanced diet with plenty of fruit and vegetables. Blood tests for lipids and cholesterol should begin at age 16 and be repeated every 5 years. If your lipid or cholesterol levels are high, you are over 50, or you are at high risk for heart disease, you may need your cholesterol levels checked more frequently. Ongoing high lipid and cholesterol levels should be treated with medicines if diet and exercise are not working.  . If you smoke, find out from your health care provider how to quit. If you do not use tobacco, please do not  start.  . If you choose to drink alcohol, please do not consume more than 2 drinks per day. One drink is considered to be 12 ounces (355 mL) of beer, 5 ounces (148 mL) of wine, or 1.5 ounces (44 mL) of liquor.  . If you are 68-49 years old, ask your health care provider if you should take aspirin to prevent strokes.  . Use sunscreen. Apply sunscreen liberally and repeatedly throughout the day. You should seek shade when your shadow is shorter than you. Protect yourself by wearing long sleeves, pants, a wide-brimmed hat, and sunglasses year round, whenever you are outdoors.  . Once a month, do a whole body skin exam, using a mirror to look at the skin on your back. Tell your health care provider of new moles, moles that have irregular borders, moles that are larger than a pencil eraser, or moles that have changed in shape or color.

## 2016-11-07 NOTE — Progress Notes (Signed)
PCP notes:   Health maintenance:  Tetanus - postponed  Abnormal screenings:   Hearing - failed  Patient concerns:   Pt states she has been experiencing difficulty in efforts to lose weight.  Nurse concerns:  None  Next PCP appt:   11/14/16 @ 1415

## 2016-11-07 NOTE — Progress Notes (Signed)
Pre visit review using our clinic review tool, if applicable. No additional management support is needed unless otherwise documented below in the visit note. 

## 2016-11-07 NOTE — Progress Notes (Signed)
Subjective:   [name] is a 71 y.o. female who presents for Medicare Annual (Subsequent) preventive examination.  Review of Systems:  N/A Cardiac Risk Factors include: advanced age (>52men, >50 women);obesity (BMI >30kg/m2);dyslipidemia     Objective:     Vitals: BP 124/70 (BP Location: Right Arm, Patient Position: Sitting, Cuff Size: Normal)   Pulse 60   Temp 98.1 F (36.7 C) (Oral)   Ht 5\' 6"  (1.676 m)   Wt 216 lb (98 kg)   SpO2 94%   BMI 34.86 kg/m   Body mass index is 34.86 kg/m.   Tobacco History  Smoking Status  . Former Smoker  . Packs/day: 1.50  . Years: 10.00  . Types: Cigarettes  . Quit date: 12/28/1974  Smokeless Tobacco  . Never Used    Comment: quit smoking in 1976     Counseling given: No   Past Medical History:  Diagnosis Date  . Acute cystitis without hematuria 12/22/2015  . Anemia   . Arthritis   . Asthma   . Cataracts, bilateral    immature  . Chronic diastolic heart failure ([place]) 09/19/2014  . Chronic insomnia   . Constipation    takes Miralax daily as needed  . COPD (chronic obstructive pulmonary disease) (HCC)    Albuterol inhaler daily as needed;Duoneb daily as needed;Spiriva daily  . DDD (degenerative disc disease)    cervical - kyphosis with mod DD changes C5/6 and C6/7; lumbar - early DD at L2/3 ([place])  . Essential hypertension    was on meds but after appointment Dec 11 with Dr.C he took her off  . GERD (gastroesophageal reflux disease)    takes Protonix daily  . History of radiation therapy 05/30/11 to 07/07/11   rectum  . History of shingles   . Hyperlipemia    takes Lovastatin daily  . Lung nodule    RLL nodule-69mm stable 2006, April 2009, and June 2009  . Obesity   . Osteopenia 12/2014   T -1.5 hip  . Pancreatitis 11/2014   ?zpack related vs gallstone pancreatitis with abnormal HIDA scan pending cholecystectomy  . Rectal cancer ([place]) 04/2011   T3N0; s/p lap LAR, s/p ileostomy, s/p reversal, s/p chemo  .  Rheumatic heart disease mitral stenosis    mod MS by echo 02/2014  . S/P AVR (aortic valve replacement) 2015   bioprosthetic ([place])  . S/P MVR (mitral valve replacement) 2015   bioprosthetic ([place])  . Seizures ([place])    febrile seizures as a child  . Severe aortic valve stenosis  AVR 3/15 10/01/2013   mild-mod by echo 02/2014  . Sleep apnea    PT TOLD BORDERLINE-TRIED CPAP-DID NOT HELP-SHE DOES NOT USE CPAP  . Small bowel obstruction, partial 08/2013   reolved without NGT placement.   . Vitamin B12 deficiency 02/28/2015   Start B12 shots 02/2015    Past Surgical History:  Procedure Laterality Date  . AORTIC VALVE REPLACEMENT N/A 12/12/2013   Procedure: AORTIC VALVE REPLACEMENT (AVR);  Surgeon: Gaye Pollack, MD; Service: Open Heart Surgery  . BACK SURGERY  2006, 2007   2006 SPACER, 2007 decompression and fusion L4/5  . BOWEL RESECTION  04/02/2012   Procedure: SMALL BOWEL RESECTION;  Surgeon: Stark Klein, MD;  Location: [facility];  Service: General;  Laterality: N/A;  . CARPAL TUNNEL RELEASE Bilateral   . CATARACT EXTRACTION W/ INTRAOCULAR LENS IMPLANT Left 10/18/2016  . CHOLECYSTECTOMY N/A 01/21/2015   chronic cholecystitis, Stark Klein, MD  .  COLON RESECTION  08/25/2011   Procedure: COLON RESECTION LAPAROSCOPIC;  Surgeon: Stark Klein, MD;  Location: [facility];  Service: General;  Laterality: N/A;  Laparoscopic Assisted Low Anterior Resection Diverting Ostomy and onQ pain pump  . COLONOSCOPY  03/2013   1 polyp, rpt 3 yrs Ardis Hughs)  . COLONOSCOPY WITH PROPOFOL N/A 06/09/2016   Procedure: COLONOSCOPY WITH PROPOFOL;  Surgeon: [name], MD;  Location: [facility];  Service: Endoscopy;  Laterality: N/A;  . EP IMPLANTABLE DEVICE N/A 06/16/2016   Procedure: Pacemaker Implant;  Surgeon: Will Meredith Leeds, MD;  Location: [facility];  Service: Cardiovascular;  Laterality: N/A;  . FOOT SURGERY  left foot   hammer toe  . HAND SURGERY  Bil   carpal tunnel  . ILEOSTOMY  08/25/2011    . ILEOSTOMY CLOSURE  04/02/2012   Procedure: ILEOSTOMY TAKEDOWN;  Surgeon: Stark Klein, MD;  Location: [facility];  Service: General;  Laterality: N/A;  . INTRAOPERATIVE TRANSESOPHAGEAL ECHOCARDIOGRAM N/A 12/12/2013   Procedure: INTRAOPERATIVE TRANSESOPHAGEAL ECHOCARDIOGRAM;  Surgeon: Gaye Pollack, MD;  Location: [facility];  Service: Open Heart Surgery;  Laterality: N/A;  . LEFT AND RIGHT HEART CATHETERIZATION WITH CORONARY ANGIOGRAM N/A 11/27/2013   Procedure: LEFT AND RIGHT HEART CATHETERIZATION WITH CORONARY ANGIOGRAM;  Surgeon: Sanda Klein, MD;  Location: [facility];  Service: Cardiovascular;  Laterality: N/A;  . MITRAL VALVE REPLACEMENT N/A 12/12/2013   Procedure: MITRAL VALVE (MV) REPLACEMENT OR REPAIR;  Surgeon: Gaye Pollack, MD; Service: Open Heart Surgery  . TEE WITHOUT CARDIOVERSION N/A 11/29/2013   Procedure: TRANSESOPHAGEAL ECHOCARDIOGRAM (TEE);  Surgeon: Sueanne Margarita, MD;  Location: [facility];  Service: Cardiovascular;  Laterality: N/A;  . TONSILLECTOMY     Family History  Problem Relation Age of Onset  . Lung cancer Father   . Heart disease Mother   . Diabetes Mother   . Hypertension Mother   . Stroke Mother   . CAD Son 53  . Heart attack Son 21  . Heart attack Maternal Grandfather   . Colon cancer Neg Hx   . Esophageal cancer Neg Hx   . Stomach cancer Neg Hx   . Rectal cancer Neg Hx    History  Sexual Activity  . Sexual activity: No    Outpatient Encounter Prescriptions as of 11/07/2016  Medication Sig  . albuterol (PROVENTIL HFA;VENTOLIN HFA) 108 (90 Base) MCG/ACT inhaler Inhale 2 puffs into the lungs every 6 (six) hours as needed for wheezing.  Marland Kitchen amoxicillin (AMOXIL) 500 MG capsule take 4 capsules by mouth 30-60 MINUTES PRIOR TO DENTAL APPT  . aspirin 81 MG tablet Take 81 mg by mouth at bedtime.   Marland Kitchen BIOTIN PO Take 1 tablet by mouth at bedtime.   . bisoprolol (ZEBETA) 5 MG tablet take 1/2 tablet by mouth once daily  . BREO ELLIPTA 200-25 MCG/INH AEPB inhale 1  puff INTO THE LUNGS daily  . Calcium Carbonate-Vitamin D (CALCIUM 600 + D PO) Take 600 mg by mouth at bedtime.   . Cholecalciferol (VITAMIN D) 2000 UNITS CAPS Take 2,000 Units by mouth at bedtime.   . furosemide (LASIX) 40 MG tablet Take one tablet by mouth daily 5 times a week.  Marland Kitchen ipratropium-albuterol (DUONEB) 0.5-2.5 (3) MG/3ML SOLN Take 3 mLs by nebulization every 6 (six) hours as needed. (Patient taking differently: Take 3 mLs by nebulization every 6 (six) hours as needed (for wheezing or shortness of breath). )  . lovastatin (MEVACOR) 40 MG tablet take 1 tablet by mouth  at bedtime  . Miconazole Nitrate 2 % POWD Apply 1 application topically daily as needed (for skin). Apply to stomach and under breasts  . montelukast (SINGULAIR) 10 MG tablet take 1 tablet by mouth at bedtime  . pantoprazole (PROTONIX) 40 MG tablet take 1 tablet by mouth at bedtime  . polycarbophil (FIBERCON) 625 MG tablet Take 625 mg by mouth daily.  . polyethylene glycol (MIRALAX / GLYCOLAX) packet Take 17 g by mouth daily.  . potassium chloride (K-DUR) 10 MEQ tablet take 2 tablets by mouth once daily (Patient taking differently: take 2 tablets by mouth once daily ( 5 days a week))  . SPIRIVA HANDIHALER 18 MCG inhalation capsule inhale the contents of one capsule in the handihaler once daily  . traMADol (ULTRAM) 50 MG tablet Take 1 tablet (50 mg total) by mouth every 6 (six) hours as needed for moderate pain.  Marland Kitchen triamcinolone cream (KENALOG) 0.1 % Apply 1 application topically 2 (two) times daily. Apply to AA.  . vitamin B-12 (CYANOCOBALAMIN) 500 MCG tablet Take 500 mcg by mouth daily.  . traZODone (DESYREL) 50 MG tablet Take 1 tablet (50 mg total) by mouth at bedtime. (Patient not taking: Reported on 11/07/2016)   No facility-administered encounter medications on file as of 11/07/2016.     Activities of Daily Living In your present state of health, do you have any difficulty performing the following activities: 11/07/2016  06/15/2016  Hearing? [place]? N -  Difficulty concentrating or making decisions? N -  Walking or climbing stairs? Y -  Dressing or bathing? N -  Doing errands, shopping? N N  Preparing Food and eating ? N -  Using the Toilet? N -  In the past six months, have you accidently leaked urine? Y -  Do you have problems with loss of bowel control? N -  Managing your Medications? N -  Managing your Finances? N -  Housekeeping or managing your Housekeeping? N -  Some recent data might be hidden    Patient Care Team: Ria Bush, MD as PCP - General (Family Medicine) [name], MD as Consulting Physician (Gastroenterology) Kyung Rudd, MD as Consulting Physician (Radiation Oncology) Nira Retort, MD as Consulting Physician (Hematology and Oncology) Terance Ice, MD as Consulting Physician (Cardiology) Brand Males, MD as Consulting Physician (Pulmonary Disease) Stark Klein, MD (General Surgery) Will Meredith Leeds, MD as Consulting Physician (Cardiology) Sanda Klein, MD as Consulting Physician (Cardiology) Shon Hough, MD as Consulting Physician (Ophthalmology) Amy Martinique, MD as Consulting Physician (Dermatology)    Assessment:     Hearing Screening   125Hz  250Hz  500Hz  1000Hz  2000Hz  3000Hz  4000Hz  6000Hz  8000Hz   Right ear:   40 0 0  0    Left ear:   40 40 0  0    Vision Screening Comments: Last vision exam in December 2017 with Dr. Kathrin Penner   Exercise Activities and Dietary recommendations Current Exercise Habits: Structured exercise class, Type of exercise: Other - see comments (pulmonary rehab), Time (Minutes): 60, Frequency (Times/Week): 2, Weekly Exercise (Minutes/Week): 120, Intensity: Moderate, Exercise limited by: None identified  Goals    . Increase physical activity          Starting 11/07/2016, I will continue to exercise at least 60 min twice weekly.       Fall Risk Fall Risk  11/07/2016 10/28/2015 10/15/2014  Falls in the past year?  No No Yes  Number falls in past yr: - - 1  Injury with Fall? - -  No  Risk for fall due to : - - Other (Comment)  Risk for fall due to (comments): - - Had been sick and passed out due to N/V   Depression Screen PHQ 2/9 Scores 11/07/2016 10/28/2015 10/15/2014 01/31/2014  PHQ - 2 Score 0 0 0 0     Cognitive Function MMSE - Mini Mental State Exam 11/07/2016  Orientation to time 5  Orientation to Place 5  Registration 3  Attention/ Calculation 0  Recall 3  Language- name 2 objects 0  Language- repeat 1  Language- follow 3 step command 3  Language- read & follow direction 0  Write a sentence 0  Copy design 0  Total score 20     PLEASE NOTE: A Mini-Cog screen was completed. Maximum score is 20. A value of 0 denotes this part of [place] MMSE was not completed or the patient failed this part of the Mini-Cog screening.   Mini-Cog Screening Orientation to Time - Max 5 pts Orientation to Place - Max 5 pts Registration - Max 3 pts Recall - Max 3 pts Language Repeat - Max 1 pts Language Follow 3 Step Command - Max 3 pts     Immunization History  Administered Date(s) Administered  . H1N1 09/17/2008  . Influenza Split 07/18/2012  . Influenza Whole 08/10/2005, 06/24/2009, 07/11/2011  . Influenza, High Dose Seasonal PF 07/04/2016  . Influenza-Unspecified 07/24/2013, 06/30/2014, 06/30/2015  . Pneumococcal Conjugate-13 06/02/2014  . Pneumococcal Polysaccharide-23 08/10/2005, 04/21/2008, 06/25/2013  . Td 10/11/2005  . Zoster 09/16/2014   Screening Tests Health Maintenance  Topic Date Due  . DTaP/Tdap/Td (1 - Tdap) 10/10/2025 (Originally 10/12/2005)  . TETANUS/TDAP  10/10/2025 (Originally 10/12/2015)  . MAMMOGRAM  02/09/2018  . COLONOSCOPY  06/10/2019  . INFLUENZA VACCINE  Completed  . DEXA SCAN  Completed  . ZOSTAVAX  Completed  . Hepatitis C Screening  Completed  . PNA vac Low Risk Adult  Completed      Plan:     I have personally reviewed and addressed the Medicare Annual  Wellness questionnaire and have noted the following in the patient's chart:  A. Medical and social history B. Use of alcohol, tobacco or illicit drugs  C. Current medications and supplements D. Functional ability and status E.  Nutritional status F.  Physical activity G. Advance directives H. List of other physicians I.  Hospitalizations, surgeries, and ER visits in previous 12 months J.  [place] to include hearing, vision, cognitive, depression L. Referrals and appointments - none  In addition, I have reviewed and discussed with patient certain preventive protocols, quality metrics, and best practice recommendations. A written personalized care plan for preventive services as well as general preventive health recommendations were provided to patient.  See attached scanned questionnaire for additional information.   Signed,   Lindell Noe, MHA, BS, LPN Health Coach

## 2016-11-08 ENCOUNTER — Encounter (HOSPITAL_COMMUNITY): Payer: Self-pay

## 2016-11-09 DIAGNOSIS — Z006 Encounter for examination for normal comparison and control in clinical research program: Secondary | ICD-10-CM

## 2016-11-09 DIAGNOSIS — J449 Chronic obstructive pulmonary disease, unspecified: Secondary | ICD-10-CM

## 2016-11-09 NOTE — Progress Notes (Unsigned)
Title: Randomised, Double-Blind (Sponsor Open), Placebo-Controlled, Multicentre, Dose Ranging Study to Evaluate the Efficacy and Safety of Danirixin Tablets Administered Twice Daily Compared With Placebo for 24 Weeks in Adults With COPD  Sponsor: [employer] , Protoocol Number: [id number], NCT Trial #: [id number]  Synopsis:  Following baseline assessments collected over a 7 day period participants will be randomized (1:1:1:1:1:1) to receive one of five dose strengths of danirixin (5 milligram [mg], 10 mg, 25 mg, 35 mg and 50 mg) or placebo. Study treatment will be administered orally twice daily for 24 weeks. Participants will continue with their standard of care inhaled medications during study. Follow up will continue up to 28 days post last dose  Key Inclusion Criteria: age 61-80, COPD with fev1 >/= 40%, ratio <0.7, >/= 2 copd exacerbations in past 1 year treated with either antibiotics or steroids, but >/= 14 days since last antibiotic or steroid for exacerbation, >/= 10 pack smoking history  Key Exclusion Criteria: non-COPD lung disease, pulse ox < 88% at rest on room air (i.e, restting o2 patients excluded), active phase of pulmonary rehabilitation (maintenance ok), previous lung surgery, QTc prolongation, HIV positive etc..   Key end points: Changes in respiratory symptoms and copd exacerbations and safety of DNX  Key features of DNX:   selective CXC chemokine receptor (CXCR2) antagonist . Positive trends in COPD exacerbation, symptoms and health status  Safety of DNX: As of April 2017 (5 completed Phase 1 studies, and 1 Phase 2A study of moderate copd patients with 45 patients):Marland Kitchen Most side effects are seen at 100mg  bid (current study max is 50mg  bid)  Phase 1 Normal Adults - dose </= 50mg  bid Study drug -    placebo  General All side effects mild - moderate. No changes in labs, EKG and vitals including PMN per PI meeting slide  x  Any adverse event 30% 13%  Headache 10-20% 0-17%   Fatigue/Lethargy 0-10% 0-3%  Abd Pain 0-11% (20% at 200mg  bid) 0%   Cough 0% 0%  Study withdrawal 0% 0%  SAE 0% 0%   - 3. COPD  COPD patients -data from IB April 2017 and PI meeting Spring 2017 Study drug Placebo  Efficacy  AECOPD and CAT scores better with drug after 1 year x  General No change in PMN, EKG, Vitals, Labs, Spirometry but produces mild to moderate headache and diarrhea  x  Any AE 52-77% 55-80%  Cystitis 5% at 75mg  bid 3%  Backpain 5% at 75mg  bid 0%  Nasopharyngitis 29% at 75mg  bid 33%  Headache 9% at 75mg  bid 8%  Upper abdominal pain 0% at 75mg  bid 6%  Infection rate 42% 50%  SAE 10 events 31 events   ........................................................................................................................................................................  Clinical Research Coordinator / Research RN note : This visit for [name] with Nov 13, 1945 on 11/09/2016 for the above protocol is Visit 6 and is for purpose of reconsent and Visit 6 procedures. The consent for this encounter is under Protocol Version Amendment 1 and is currently IRB approved. Subject expressed continued interest and consent in continuing as a study subject. Subject thanked for participation.   Previously reviewed consent forms (revisions 10/Nov/17 and 20/Nov/17) were discussed with patient.  Patient had previously not signed pages on sub-study participation she declined.  These were signed.  See NTF in subject's research paper chart for full documentation.  Patient was provided with new ICF revision date 26/Jan/18.  Patient re-consented today on this new ICF.  Patient expressed voluntary willingness to continue participation and  was thanked for participation.  See patient's paper research chart for full documentation of the re-consent process.  Because changes were minor and not impacting safety, an investigator was not required to also review the changes with the  subject.  Signed by [name], RN Research Nurse Office [phone] [facility]  [place], [place] 3:58 PM 11/09/2016

## 2016-11-09 NOTE — Progress Notes (Signed)
Title: Randomised, Double-Blind (Sponsor Open), Placebo-Controlled, Multicentre, Dose Ranging Study to Evaluate the Efficacy and Safety of Danirixin Tablets Administered Twice Daily Compared With Placebo for 24 Weeks in Adults With COPD  Sponsor: [employer] , Protoocol Number: [id number], NCT Trial #: [id number]  Synopsis:  Following baseline assessments collected over a 7 day period participants will be randomized (1:1:1:1:1:1) to receive one of five dose strengths of danirixin (5 milligram [mg], 10 mg, 25 mg, 35 mg and 50 mg) or placebo. Study treatment will be administered orally twice daily for 24 weeks. Participants will continue with their standard of care inhaled medications during study. Follow up will continue up to 28 days post last dose  Key Inclusion Criteria: age 21-80, COPD with fev1 >/= 40%, ratio <0.7, >/= 2 copd exacerbations in past 1 year treated with either antibiotics or steroids, but >/= 14 days since last antibiotic or steroid for exacerbation, >/= 10 pack smoking history  Key Exclusion Criteria: non-COPD lung disease, pulse ox < 88% at rest on room air (i.e, restting o2 patients excluded), active phase of pulmonary rehabilitation (maintenance ok), previous lung surgery, QTc prolongation, HIV positive etc..   Key end points: Changes in respiratory symptoms and copd exacerbations and safety of DNX  Key features of DNX:   selective CXC chemokine receptor (CXCR2) antagonist . Positive trends in COPD exacerbation, symptoms and health status  Safety of DNX: As of April 2017 (5 completed Phase 1 studies, and 1 Phase 2A study of moderate copd patients with 45 patients):Marland Kitchen Most side effects are seen at 100mg  bid (current study max is 50mg  bid)  Phase 1 Normal Adults - dose </= 50mg  bid Study drug -    placebo  General All side effects mild - moderate. No changes in labs, EKG and vitals including PMN per PI meeting slide  x  Any adverse event 30% 13%  Headache 10-20% 0-17%   Fatigue/Lethargy 0-10% 0-3%  Abd Pain 0-11% (20% at 200mg  bid) 0%   Cough 0% 0%  Study withdrawal 0% 0%  SAE 0% 0%   - 3. COPD  COPD patients -data from IB April 2017 and PI meeting Spring 2017 Study drug Placebo  Efficacy  AECOPD and CAT scores better with drug after 1 year x  General No change in PMN, EKG, Vitals, Labs, Spirometry but produces mild to moderate headache and diarrhea  x  Any AE 52-77% 55-80%  Cystitis 5% at 75mg  bid 3%  Backpain 5% at 75mg  bid 0%  Nasopharyngitis 29% at 75mg  bid 33%  Headache 9% at 75mg  bid 8%  Upper abdominal pain 0% at 75mg  bid 6%  Infection rate 42% 50%  SAE 10 events 31 events   ........................................................................................................................................................................ Clinical Research Coordinator / Research RN note : This visit for [name] with DOB of [birth year] on 11/09/2016 for the above protocol is Visit # 6 and is for purpose of research . The consent for this encounter is under Protocol Version Amendment 1 and is currently IRB approved. Subject expressed continued interest and consent in continuing as a study subject. Subject thanked for participation.   Subject was re-consented by [name], RN, please refer to her note for documentation of this.   Reviewed unresolved AE of worsening headaches reported to this coordinator at Visit 5 on 03Jan18. Subject states that the headaches have improved and are gone as of 17Jan18. This information has been updated in the subjects paper source binder.   All required procedures were completed per the above  mentioned protocol. Please refer to the subjects paper source binder for further documentation.   Signed by  Waimea Bing, [place] Coordinator  [facility]  [place], [place] 2:59 PM 11/09/2016

## 2016-11-10 ENCOUNTER — Encounter (HOSPITAL_COMMUNITY)
Admission: RE | Admit: 2016-11-10 | Discharge: 2016-11-10 | Disposition: A | Discharge status: Home / Self Care | Payer: Self-pay | Source: Ambulatory Visit | Attending: Internal Medicine | Admitting: Internal Medicine

## 2016-11-10 DIAGNOSIS — R0600 Dyspnea, unspecified: Secondary | ICD-10-CM | POA: Insufficient documentation

## 2016-11-10 DIAGNOSIS — Z954 Presence of other heart-valve replacement: Secondary | ICD-10-CM | POA: Insufficient documentation

## 2016-11-12 NOTE — Progress Notes (Signed)
I reviewed health advisor's note, was available for consultation, and agree with documentation and plan.  

## 2016-11-14 ENCOUNTER — Encounter: Payer: Self-pay | Admitting: Family Medicine

## 2016-11-14 ENCOUNTER — Ambulatory Visit (INDEPENDENT_AMBULATORY_CARE_PROVIDER_SITE_OTHER): Payer: Medicare Other | Admitting: Family Medicine

## 2016-11-14 VITALS — BP 110/64 | HR 87 | Temp 98.3°F | Resp 16 | Ht 66.0 in | Wt 215.1 lb

## 2016-11-14 DIAGNOSIS — N183 Chronic kidney disease, stage 3 unspecified: Secondary | ICD-10-CM

## 2016-11-14 DIAGNOSIS — E785 Hyperlipidemia, unspecified: Secondary | ICD-10-CM

## 2016-11-14 DIAGNOSIS — F5104 Psychophysiologic insomnia: Secondary | ICD-10-CM

## 2016-11-14 DIAGNOSIS — Z0000 Encounter for general adult medical examination without abnormal findings: Secondary | ICD-10-CM

## 2016-11-14 DIAGNOSIS — I10 Essential (primary) hypertension: Secondary | ICD-10-CM

## 2016-11-14 DIAGNOSIS — E669 Obesity, unspecified: Secondary | ICD-10-CM

## 2016-11-14 DIAGNOSIS — J449 Chronic obstructive pulmonary disease, unspecified: Secondary | ICD-10-CM

## 2016-11-14 DIAGNOSIS — F4321 Adjustment disorder with depressed mood: Secondary | ICD-10-CM

## 2016-11-14 DIAGNOSIS — C20 Malignant neoplasm of rectum: Secondary | ICD-10-CM

## 2016-11-14 DIAGNOSIS — Z7189 Other specified counseling: Secondary | ICD-10-CM | POA: Diagnosis not present

## 2016-11-14 DIAGNOSIS — E559 Vitamin D deficiency, unspecified: Secondary | ICD-10-CM | POA: Diagnosis not present

## 2016-11-14 DIAGNOSIS — I5032 Chronic diastolic (congestive) heart failure: Secondary | ICD-10-CM

## 2016-11-14 DIAGNOSIS — M8580 Other specified disorders of bone density and structure, unspecified site: Secondary | ICD-10-CM

## 2016-11-14 DIAGNOSIS — Z952 Presence of prosthetic heart valve: Secondary | ICD-10-CM

## 2016-11-14 DIAGNOSIS — E538 Deficiency of other specified B group vitamins: Secondary | ICD-10-CM

## 2016-11-14 MED ORDER — TRAMADOL HCL 50 MG PO TABS: 50.0000 mg | ORAL_TABLET | Freq: Four times a day (QID) | ORAL | 0 refills | Status: DC | PRN

## 2016-11-14 NOTE — Assessment & Plan Note (Signed)
repleting well. Continue 2000 IU daily.

## 2016-11-14 NOTE — Progress Notes (Signed)
Pre visit review using our clinic review tool, if applicable. No additional management support is needed unless otherwise documented below in the visit note. 

## 2016-11-14 NOTE — Assessment & Plan Note (Signed)
Reviewed with patient, as well as importance of good hydration status

## 2016-11-14 NOTE — Assessment & Plan Note (Signed)
Discussed healthy diet and lifestyle changes to affect sustainable weight loss  

## 2016-11-14 NOTE — Assessment & Plan Note (Signed)
Stable period. S/p 5 years disease recurrence free. Has been discharged from oncology clinic. Planned f/u colonoscopy 2022

## 2016-11-14 NOTE — Assessment & Plan Note (Signed)
Chronic, stable. Continue current regimen. 

## 2016-11-14 NOTE — Patient Instructions (Addendum)
Decrease protonix to every other day for 1 month, if doing well may trial off.  Bring [place] copy of your updated advanced directives.  You are doing well today.  Work on increased fatty fish in diet.  Return in 6 months for follow up, sooner if needed.   Health Maintenance, Female Introduction Adopting a healthy lifestyle and getting preventive care can go a long way to promote health and wellness. Talk with your health care provider about what schedule of regular examinations is right for you. This is a good chance for you to check in with your provider about disease prevention and staying healthy. In between checkups, there are plenty of things you can do on your own. Experts have done a lot of research about which lifestyle changes and preventive measures are most likely to keep you healthy. Ask your health care provider for more information. Weight and diet Eat a healthy diet  Be sure to include plenty of vegetables, fruits, low-fat dairy products, and lean protein.  Do not eat a lot of foods high in solid fats, added sugars, or salt.  Get regular exercise. This is one of the most important things you can do for your health.  Most adults should exercise for at least 150 minutes each week. The exercise should increase your heart rate and make you sweat (moderate-intensity exercise).  Most adults should also do strengthening exercises at least twice a week. This is in addition to the moderate-intensity exercise. Maintain a healthy weight  Body mass index (BMI) is a measurement that can be used to identify possible weight problems. It estimates body fat based on height and weight. Your health care provider can help determine your BMI and help you achieve or maintain a healthy weight.  For females 60 years of age and older:  A BMI below 18.5 is considered underweight.  A BMI of 18.5 to 24.9 is normal.  A BMI of 25 to 29.9 is considered overweight.  A BMI of 30 and above is considered  obese. Watch levels of cholesterol and blood lipids  You should start having your blood tested for lipids and cholesterol at 72 years of age, then have this test every 5 years.  You may need to have your cholesterol levels checked more often if:  Your lipid or cholesterol levels are high.  You are older than 72 years of age.  You are at high risk for heart disease. Cancer screening Lung Cancer  Lung cancer screening is recommended for adults 75-75 years old who are at high risk for lung cancer because of a history of smoking.  A yearly low-dose CT scan of the lungs is recommended for people who:  Currently smoke.  Have quit within the past 15 years.  Have at least a 30-pack-year history of smoking. A pack year is smoking an average of one pack of cigarettes a day for 1 year.  Yearly screening should continue until it has been 15 years since you quit.  Yearly screening should stop if you develop a health problem that would prevent you from having lung cancer treatment. Breast Cancer  Practice breast self-awareness. This means understanding how your breasts normally appear and feel.  It also means doing regular breast self-exams. Let your health care provider know about any changes, no matter how small.  If you are in your 20s or 30s, you should have a clinical breast exam (CBE) by a health care provider every 1-3 years as part of a regular health  exam.  If you are 40 or older, have a CBE every year. Also consider having a breast X-ray (mammogram) every year.  If you have a family history of breast cancer, talk to your health care provider about genetic screening.  If you are at high risk for breast cancer, talk to your health care provider about having an MRI and a mammogram every year.  Breast cancer gene (BRCA) assessment is recommended for women who have family members with BRCA-related cancers. BRCA-related cancers include:  Breast.  Ovarian.  Tubal.  Peritoneal  cancers.  Results of the assessment will determine the need for genetic counseling and BRCA1 and BRCA2 testing. Cervical Cancer  Your health care provider may recommend that you be screened regularly for cancer of the pelvic organs (ovaries, uterus, and vagina). This screening involves a pelvic examination, including checking for microscopic changes to the surface of your cervix (Pap test). You may be encouraged to have this screening done every 3 years, beginning at age 69.  For women ages 1-65, health care providers may recommend pelvic exams and Pap testing every 3 years, or they may recommend the Pap and pelvic exam, combined with testing for human papilloma virus (HPV), every 5 years. Some types of HPV increase your risk of cervical cancer. Testing for HPV may also be done on women of any age with unclear Pap test results.  Other health care providers may not recommend any screening for nonpregnant women who are considered low risk for pelvic cancer and who do not have symptoms. Ask your health care provider if a screening pelvic exam is right for you.  If you have had past treatment for cervical cancer or a condition that could lead to cancer, you need Pap tests and screening for cancer for at least 20 years after your treatment. If Pap tests have been discontinued, your risk factors (such as having a new sexual partner) need to be reassessed to determine if screening should resume. Some women have medical problems that increase the chance of getting cervical cancer. In these cases, your health care provider may recommend more frequent screening and Pap tests. Colorectal Cancer  This type of cancer can be detected and often prevented.  Routine colorectal cancer screening usually begins at 72 years of age and continues through 72 years of age.  Your health care provider may recommend screening at an earlier age if you have risk factors for colon cancer.  Your health care provider may also  recommend using home test kits to check for hidden blood in the stool.  A small camera at the end of a tube can be used to examine your colon directly (sigmoidoscopy or colonoscopy). This is done to check for the earliest forms of colorectal cancer.  Routine screening usually begins at age 77.  Direct examination of the colon should be repeated every 5-10 years through 72 years of age. However, you may need to be screened more often if early forms of precancerous polyps or small growths are found. Skin Cancer  Check your skin from head to toe regularly.  Tell your health care provider about any new moles or changes in moles, especially if there is a change in a mole's shape or color.  Also tell your health care provider if you have a mole that is larger than the size of a pencil eraser.  Always use sunscreen. Apply sunscreen liberally and repeatedly throughout the day.  Protect yourself by wearing long sleeves, pants, a wide-brimmed hat, and  sunglasses whenever you are outside. Heart disease, diabetes, and high blood pressure  High blood pressure causes heart disease and increases the risk of stroke. High blood pressure is more likely to develop in:  People who have blood pressure in the high end of the normal range (130-139/85-89 mm Hg).  People who are overweight or obese.  People who are African American.  If you are 22-29 years of age, have your blood pressure checked every 3-5 years. If you are 63 years of age or older, have your blood pressure checked every year. You should have your blood pressure measured twice-once when you are at a hospital or clinic, and once when you are not at a hospital or clinic. Record the average of the two measurements. To check your blood pressure when you are not at a hospital or clinic, you can use:  An automated blood pressure machine at a pharmacy.  A home blood pressure monitor.  If you are between 81 years and 36 years old, ask your health  care provider if you should take aspirin to prevent strokes.  Have regular diabetes screenings. This involves taking a blood sample to check your fasting blood sugar level.  If you are at a normal weight and have a low risk for diabetes, have this test once every three years after 72 years of age.  If you are overweight and have a high risk for diabetes, consider being tested at a younger age or more often. Preventing infection Hepatitis B  If you have a higher risk for hepatitis B, you should be screened for this virus. You are considered at high risk for hepatitis B if:  You were born in a country where hepatitis B is common. Ask your health care provider which countries are considered high risk.  Your parents were born in a high-risk country, and you have not been immunized against hepatitis B (hepatitis B vaccine).  You have HIV or AIDS.  You use needles to inject street drugs.  You live with someone who has hepatitis B.  You have had sex with someone who has hepatitis B.  You get hemodialysis treatment.  You take certain medicines for conditions, including cancer, organ transplantation, and autoimmune conditions. Hepatitis C  Blood testing is recommended for:  Everyone born from [birth year] through 1966.  Anyone with known risk factors for hepatitis C. Sexually transmitted infections (STIs)  You should be screened for sexually transmitted infections (STIs) including gonorrhea and chlamydia if:  You are sexually active and are younger than 72 years of age.  You are older than 72 years of age and your health care provider tells you that you are at risk for this type of infection.  Your sexual activity has changed since you were last screened and you are at an increased risk for chlamydia or gonorrhea. Ask your health care provider if you are at risk.  If you do not have HIV, but are at risk, it may be recommended that you take a prescription medicine daily to prevent HIV  infection. This is called pre-exposure prophylaxis (PrEP). You are considered at risk if:  You are sexually active and do not regularly use condoms or know the HIV status of your partner(s).  You take drugs by injection.  You are sexually active with a partner who has HIV. Talk with your health care provider about whether you are at high risk of being infected with HIV. If you choose to begin PrEP, you should first be tested  for HIV. You should then be tested every 3 months for as long as you are taking PrEP. Pregnancy  If you are premenopausal and you may become pregnant, ask your health care provider about preconception counseling.  If you may become pregnant, take 400 to 800 micrograms (mcg) of folic acid every day.  If you want to prevent pregnancy, talk to your health care provider about birth control (contraception). Osteoporosis and menopause  Osteoporosis is a disease in which the bones lose minerals and strength with aging. This can result in serious bone fractures. Your risk for osteoporosis can be identified using a bone density scan.  If you are 87 years of age or older, or if you are at risk for osteoporosis and fractures, ask your health care provider if you should be screened.  Ask your health care provider whether you should take a calcium or vitamin D supplement to lower your risk for osteoporosis.  Menopause may have certain physical symptoms and risks.  Hormone replacement therapy may reduce some of these symptoms and risks. Talk to your health care provider about whether hormone replacement therapy is right for you. Follow these instructions at home:  Schedule regular health, dental, and eye exams.  Stay current with your immunizations.  Do not use any tobacco products including cigarettes, chewing tobacco, or electronic cigarettes.  If you are pregnant, do not drink alcohol.  If you are breastfeeding, limit how much and how often you drink alcohol.  Limit  alcohol intake to no more than 1 drink per day for nonpregnant women. One drink equals 12 ounces of beer, 5 ounces of wine, or 1 ounces of hard liquor.  Do not use street drugs.  Do not share needles.  Ask your health care provider for help if you need support or information about quitting drugs.  Tell your health care provider if you often feel depressed.  Tell your health care provider if you have ever been abused or do not feel safe at home. This information is not intended to replace advice given to you by your health care provider. Make sure you discuss any questions you have with your health care provider. Document Released: 04/11/2011 Document Revised: 03/03/2016 Document Reviewed: 06/30/2015  2017 Elsevier

## 2016-11-14 NOTE — Assessment & Plan Note (Signed)
b12 converted from IM to PO 10/2015. Will be due for rpt testing next labwork to ensure maintaining levels.

## 2016-11-14 NOTE — Assessment & Plan Note (Signed)
Continue curretn regimen of spiriva and singulair and breo. She is participating in investigational study through Dr Lucius Conn office.

## 2016-11-14 NOTE — Progress Notes (Signed)
BP 110/64 (BP Location: Left Arm, Patient Position: Sitting, Cuff Size: Large)   Pulse 87   Temp 98.3 F (36.8 C) (Oral)   Resp 16   Ht 5\' 6"  (1.676 m)   Wt 215 lb 1.9 oz (97.6 kg)   SpO2 95%   BMI 34.72 kg/m    CC: medicare wellness visit Subjective:    Patient ID: [name], female    DOB: January 20, 1946, 71 y.o.   MRN: [id number]  HPI: [name] is a 71 y.o. female presenting on 11/14/2016 for Annual Exam   Saw [name] last week for medicare wellness visit. Note reviewed.  Trazodone ineffective for sleep so she stopped.  Rare GERD - on daily PPI, wonders if can taper off.  Rectal cancer - released from Dr Benay Spice - 5 yrs cancer free 08/2016.   Preventative: COLONOSCOPY WITH PROPOFOL 06/09/2016 patent colo-colonic anastomosis, rpt 5 yrs Ardis Hughs) Well woman - none recently. Had normal paps in past and would like to age out of cervical cancer screening. Discussed other GYN cancers. Mammogram 02/2016 WNL.  DEXA - 01/2015. T score -1.5. Doesn't drink milk. Eats yogurt every morning.  Flu shot yearly  pnuemovax 06/2013, prevnar 05/2014 Td Jan 10, 2006, deferred for now  zostavax - 09/2014 Advanced directives: scanned and in chart 10/2014. Has living will at home. HCPOA is husband then son. Has updated at home, asked to bring [place] copy.  Seat belt use discussed Sunscreen use discussed. No changing moles on skin.  Non smoker Alcohol - social  Widow. Husband died 04/12/15 MVA. 1 cat.  2 sons; 1 grandchild. One son died of MI 51 Married 01-10-65  Retired-[employer], volunteers at [employer], to start at [facility] [employer] [facility] in [place] Activity: pulm rehab  Diet: some water, fruits/vegetables daily, yogurt daily  Relevant past medical, surgical, family and social history reviewed and updated as indicated. Interim medical history since our last visit reviewed. Allergies and medications reviewed and updated. Current Outpatient Prescriptions on File Prior to Visit  Medication Sig  . albuterol (PROVENTIL  HFA;VENTOLIN HFA) 108 (90 Base) MCG/ACT inhaler Inhale 2 puffs into the lungs every 6 (six) hours as needed for wheezing.  Marland Kitchen amoxicillin (AMOXIL) 500 MG capsule take 4 capsules by mouth 30-60 MINUTES PRIOR TO DENTAL APPT  . aspirin 81 MG tablet Take 81 mg by mouth at bedtime.   Marland Kitchen BIOTIN PO Take 1 tablet by mouth at bedtime.   . bisoprolol (ZEBETA) 5 MG tablet take 1/2 tablet by mouth once daily  . BREO ELLIPTA 200-25 MCG/INH AEPB inhale 1 puff INTO THE LUNGS daily  . Calcium Carbonate-Vitamin D (CALCIUM 600 + D PO) Take 600 mg by mouth at bedtime.   . Cholecalciferol (VITAMIN D) 2000 UNITS CAPS Take 2,000 Units by mouth at bedtime.   . furosemide (LASIX) 40 MG tablet Take one tablet by mouth daily 5 times a week.  Marland Kitchen ipratropium-albuterol (DUONEB) 0.5-2.5 (3) MG/3ML SOLN Take 3 mLs by nebulization every 6 (six) hours as needed. (Patient taking differently: Take 3 mLs by nebulization every 6 (six) hours as needed (for wheezing or shortness of breath). )  . lovastatin (MEVACOR) 40 MG tablet take 1 tablet by mouth at bedtime  . Miconazole Nitrate 2 % POWD Apply 1 application topically daily as needed (for skin). Apply to stomach and under breasts  . montelukast (SINGULAIR) 10 MG tablet take 1 tablet by mouth at bedtime  . polycarbophil (FIBERCON) 625 MG tablet Take 625 mg by mouth daily.  . polyethylene  glycol (MIRALAX / GLYCOLAX) packet Take 17 g by mouth daily.  . potassium chloride (K-DUR) 10 MEQ tablet take 2 tablets by mouth once daily (Patient taking differently: take 2 tablets by mouth once daily ( 5 days a week))  . SPIRIVA HANDIHALER 18 MCG inhalation capsule inhale the contents of one capsule in the handihaler once daily  . vitamin B-12 (CYANOCOBALAMIN) 500 MCG tablet Take 500 mcg by mouth daily.  Marland Kitchen triamcinolone cream (KENALOG) 0.1 % Apply 1 application topically 2 (two) times daily. Apply to AA. (Patient not taking: Reported on 11/14/2016)   No current facility-administered medications on  file prior to visit.     Review of Systems  Constitutional: Negative for activity change, appetite change, chills, fatigue, fever and unexpected weight change.  HENT: Negative for hearing loss.   Eyes: Negative for visual disturbance.  Respiratory: Positive for shortness of breath (with exertion). Negative for cough, chest tightness and wheezing.   Cardiovascular: Negative for chest pain, palpitations and leg swelling.  Gastrointestinal: Negative for abdominal distention, abdominal pain, blood in stool, constipation, diarrhea, nausea and vomiting.  Genitourinary: Negative for difficulty urinating and hematuria.  Musculoskeletal: Negative for arthralgias, myalgias and neck pain.  Skin: Negative for rash.  Neurological: Negative for dizziness, seizures, syncope and headaches.  Hematological: Negative for adenopathy. Does not bruise/bleed easily.  Psychiatric/Behavioral: Positive for dysphoric mood (stays depressed about husband's passing, declines med). The patient is not nervous/anxious.    Per HPI unless specifically indicated in ROS section     Objective:    BP 110/64 (BP Location: Left Arm, Patient Position: Sitting, Cuff Size: Large)   Pulse 87   Temp 98.3 F (36.8 C) (Oral)   Resp 16   Ht 5\' 6"  (1.676 m)   Wt 215 lb 1.9 oz (97.6 kg)   SpO2 95%   BMI 34.72 kg/m   Wt Readings from Last 3 Encounters:  11/14/16 215 lb 1.9 oz (97.6 kg)  11/07/16 216 lb (98 kg)  10/24/16 217 lb (98.4 kg)    Physical Exam  Constitutional: She is oriented to person, place, and time. She appears well-developed and well-nourished. No distress.  HENT:  Head: Normocephalic and atraumatic.  Right Ear: Hearing, tympanic membrane, external ear and ear canal normal.  Left Ear: Hearing, tympanic membrane, external ear and ear canal normal.  Nose: Nose normal.  Mouth/Throat: Uvula is midline, oropharynx is clear and moist and mucous membranes are normal. No oropharyngeal exudate, posterior oropharyngeal  edema or posterior oropharyngeal erythema.  Eyes: Conjunctivae and EOM are normal. Pupils are equal, round, and reactive to light. No scleral icterus.  Neck: Normal range of motion. Neck supple.  Cardiovascular: Normal rate, regular rhythm and intact distal pulses.   Murmur (mechanical) heard. Pulses:      Radial pulses are 2+ on the right side, and 2+ on the left side.  Pulmonary/Chest: Effort normal and breath sounds normal. No respiratory distress. She has no wheezes. She has no rales.  Abdominal: Soft. Bowel sounds are normal. She exhibits no distension and no mass. There is no tenderness. There is no rebound and no guarding.  Musculoskeletal: Normal range of motion. She exhibits no edema.  Lymphadenopathy:    She has no cervical adenopathy.  Neurological: She is alert and oriented to person, place, and time.  CN grossly intact, station and gait intact  Skin: Skin is warm and dry. No rash noted.  Psychiatric: She has a normal mood and affect. Her behavior is normal. Judgment and thought  content normal.  Nursing note and vitals reviewed.  Results for orders placed or performed in visit on 11/07/16  Lipid panel  Result Value Ref Range   Cholesterol 186 0 - 200 mg/dL   Triglycerides 138.0 0.0 - 149.0 mg/dL   HDL 54.00 >39.00 mg/dL   VLDL 27.6 0.0 - 40.0 mg/dL   LDL Cholesterol 105 (H) 0 - 99 mg/dL   Total CHOL/HDL Ratio 3    NonHDL 132.24   Comprehensive metabolic panel  Result Value Ref Range   Sodium 139 135 - 145 mEq/L   Potassium 4.0 3.5 - 5.1 mEq/L   Chloride 101 96 - 112 mEq/L   CO2 32 19 - 32 mEq/L   Glucose, Bld 93 70 - 99 mg/dL   BUN 20 6 - 23 mg/dL   Creatinine, Ser 1.10 0.40 - 1.20 mg/dL   Total Bilirubin 0.6 0.2 - 1.2 mg/dL   Alkaline Phosphatase 72 39 - 117 U/L   AST 21 0 - 37 U/L   ALT 11 0 - 35 U/L   Total Protein 6.6 6.0 - 8.3 g/dL   Albumin 3.8 3.5 - 5.2 g/dL   Calcium 9.5 8.4 - 10.5 mg/dL   GFR 51.99 (L) >60.00 mL/min  CBC with Differential/Platelet    Result Value Ref Range   WBC 5.5 4.0 - 10.5 K/uL   RBC 4.42 3.87 - 5.11 Mil/uL   Hemoglobin 12.9 12.0 - 15.0 g/dL   HCT 39.7 36.0 - 46.0 %   MCV 89.7 78.0 - 100.0 fl   MCHC 32.5 30.0 - 36.0 g/dL   RDW 15.7 (H) 11.5 - 15.5 %   Platelets 174.0 150.0 - 400.0 K/uL   Neutrophils Relative % 68.6 43.0 - 77.0 %   Lymphocytes Relative 15.4 12.0 - 46.0 %   Monocytes Relative 12.5 (H) 3.0 - 12.0 %   Eosinophils Relative 2.4 0.0 - 5.0 %   Basophils Relative 1.1 0.0 - 3.0 %   Neutro Abs 3.7 1.4 - 7.7 K/uL   Lymphs Abs 0.8 0.7 - 4.0 K/uL   Monocytes Absolute 0.7 0.1 - 1.0 K/uL   Eosinophils Absolute 0.1 0.0 - 0.7 K/uL   Basophils Absolute 0.1 0.0 - 0.1 K/uL  VITAMIN D 25 Hydroxy (Vit-D Deficiency, Fractures)  Result Value Ref Range   VITD 36.02 30.00 - 100.00 ng/mL  TSH  Result Value Ref Range   TSH 1.80 0.35 - 4.50 uIU/mL   *Note: Due to a large number of results and/or encounters for the requested time period, some results have not been displayed. A complete set of results can be found in Results Review.      Assessment & Plan:   Problem List Items Addressed This Visit    Adjustment disorder with depressed mood    Ongoing struggle after losing son then husband within 6 months (2016). Support provided. Not interested at this time in further treatment. Encouraged continued social support through church family, friends, and volunteering. Sertraline was previously ineffective.       Advanced care planning/counseling discussion    Advanced directives: scanned and in chart 10/2014. Has living will at home. HCPOA is husband then son. Has updated at home, asked to bring [place] copy.       Chronic diastolic heart failure (HCC)    Stable period, seems euvolemic.      Chronic insomnia    Trazodone ineffective, discontinued.       CKD (chronic kidney disease) stage 3, GFR 30-59 ml/min  Reviewed with patient, as well as importance of good hydration status      COPD (chronic obstructive  pulmonary disease) ([place])    Continue curretn regimen of spiriva and singulair and breo. She is participating in investigational study through Dr Lucius Conn office.      Essential hypertension, benign    Chronic, stable. Continue current regimen.       Health maintenance examination - Primary    Preventative protocols reviewed and updated unless pt declined. Discussed healthy diet and lifestyle.       HLD (hyperlipidemia)    Chronic, stable. Continue statin.       Obesity, Class I, BMI 30-34.9    Discussed healthy diet and lifestyle changes to affect sustainable weight loss.       Osteopenia    Discussed recommended amt of calcium/vit D daily as well as importance of regular weight bearing exercises.       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 S/p 5 years disease recurrence free. Has been discharged from oncology clinic. 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      Vitamin B12 deficiency    b12 converted from IM to PO 10/2015. Will be due for rpt testing next labwork to ensure maintaining levels.       Vitamin D deficiency    repleting well. Continue 2000 IU daily.           Follow up plan: Return in about 6 months (around 05/14/2017) for follow up visit.  Ria Bush, MD

## 2016-11-14 NOTE — Assessment & Plan Note (Signed)
Chronic, stable. Continue statin.  

## 2016-11-14 NOTE — Assessment & Plan Note (Signed)
Stable period, only on aspirin .

## 2016-11-14 NOTE — Assessment & Plan Note (Signed)
Ongoing struggle after losing son then husband within 6 months (2016). Support provided. Not interested at this time in further treatment. Encouraged continued social support through church family, friends, and volunteering. Sertraline was previously ineffective.

## 2016-11-14 NOTE — Assessment & Plan Note (Signed)
Stable period, seems euvolemic.  

## 2016-11-14 NOTE — Assessment & Plan Note (Signed)
Trazodone ineffective, discontinued.

## 2016-11-14 NOTE — Assessment & Plan Note (Signed)
Preventative protocols reviewed and updated unless pt declined. Discussed healthy diet and lifestyle.  

## 2016-11-14 NOTE — Assessment & Plan Note (Signed)
Advanced directives: scanned and in chart 10/2014. Has living will at home. HCPOA is husband then son. Has updated at home, asked to bring [place] copy.

## 2016-11-14 NOTE — Assessment & Plan Note (Signed)
Discussed recommended amt of calcium/vit D daily as well as importance of regular weight bearing exercises.

## 2016-11-15 ENCOUNTER — Encounter: Payer: Self-pay | Admitting: Family Medicine

## 2016-11-15 ENCOUNTER — Encounter (HOSPITAL_COMMUNITY)
Admission: RE | Admit: 2016-11-15 | Discharge: 2016-11-15 | Disposition: A | Discharge status: Home / Self Care | Payer: Self-pay | Source: Ambulatory Visit | Attending: Internal Medicine | Admitting: Internal Medicine

## 2016-11-17 ENCOUNTER — Encounter (HOSPITAL_COMMUNITY): Payer: Self-pay

## 2016-11-17 ENCOUNTER — Ambulatory Visit: Payer: Medicare Other | Admitting: Cardiovascular Disease

## 2016-11-22 ENCOUNTER — Encounter (HOSPITAL_COMMUNITY)
Admission: RE | Admit: 2016-11-22 | Discharge: 2016-11-22 | Disposition: A | Discharge status: Home / Self Care | Payer: Self-pay | Source: Ambulatory Visit | Attending: Internal Medicine | Admitting: Internal Medicine

## 2016-11-24 ENCOUNTER — Encounter (HOSPITAL_COMMUNITY): Admission: RE | Admit: 2016-11-24 | Payer: Self-pay | Source: Ambulatory Visit

## 2016-11-26 ENCOUNTER — Other Ambulatory Visit: Payer: Self-pay | Admitting: Internal Medicine

## 2016-11-29 ENCOUNTER — Encounter (HOSPITAL_COMMUNITY)
Admission: RE | Admit: 2016-11-29 | Discharge: 2016-11-29 | Disposition: A | Discharge status: Home / Self Care | Payer: Self-pay | Source: Ambulatory Visit | Attending: Internal Medicine | Admitting: Internal Medicine

## 2016-12-01 ENCOUNTER — Encounter (HOSPITAL_COMMUNITY)
Admission: RE | Admit: 2016-12-01 | Discharge: 2016-12-01 | Disposition: A | Discharge status: Home / Self Care | Payer: Self-pay | Source: Ambulatory Visit | Attending: Internal Medicine | Admitting: Internal Medicine

## 2016-12-01 ENCOUNTER — Telehealth: Payer: Self-pay | Admitting: *Deleted

## 2016-12-01 NOTE — Telephone Encounter (Signed)
Spoke with the subject and reminded her of her appointment tomorrow for Visit 7 of [place] Protocol Version Amendment 1. Subject reminded to not take any of her maintenance inhalers or rescue inhalers the morning of the visit and to also hold her AM dose of the study medication. The study medication will be given at the visit in the office. Subject stated understanding.   Baudette Bing, [place], Research Assistant, Clinical Coordinator

## 2016-12-02 ENCOUNTER — Encounter: Payer: Self-pay | Admitting: Adult Health

## 2016-12-02 ENCOUNTER — Ambulatory Visit (INDEPENDENT_AMBULATORY_CARE_PROVIDER_SITE_OTHER): Payer: Medicare Other | Admitting: Adult Health

## 2016-12-02 VITALS — BP 124/82 | HR 59 | Temp 97.8°F | Resp 16

## 2016-12-02 DIAGNOSIS — J449 Chronic obstructive pulmonary disease, unspecified: Secondary | ICD-10-CM

## 2016-12-02 DIAGNOSIS — Z006 Encounter for examination for normal comparison and control in clinical research program: Secondary | ICD-10-CM

## 2016-12-02 NOTE — Assessment & Plan Note (Signed)
Compensated without flare   Plan Cont on current regimen  

## 2016-12-02 NOTE — Assessment & Plan Note (Signed)
Title: Randomised, Double-Blind (Sponsor Open), Placebo-Controlled, Multicentre, Dose Ranging Study to Evaluate the Efficacy and Safety of Danirixin Tablets Administered Twice Daily Compared With Placebo for 24 Weeks in Adults With COPD  Sponsor: [employer] ,Protoocol Number: [id number] Trial #: [id number]  Appears to be tolerating study.  Cont per research protocol.

## 2016-12-02 NOTE — Progress Notes (Signed)
Title: Randomised, Double-Blind (Sponsor Open), Placebo-Controlled, Multicentre, Dose Ranging Study to Evaluate the Efficacy and Safety of Danirixin Tablets Administered Twice Daily Compared With Placebo for 24 Weeks in Adults With COPD  Sponsor: [employer] , Protoocol Number: [id number], NCT Trial #: [id number]  Synopsis:  Following baseline assessments collected over a 7 day period participants will be randomized (1:1:1:1:1:1) to receive one of five dose strengths of danirixin (5 milligram [mg], 10 mg, 25 mg, 35 mg and 50 mg) or placebo. Study treatment will be administered orally twice daily for 24 weeks. Participants will continue with their standard of care inhaled medications during study. Follow up will continue up to 28 days post last dose  Key Inclusion Criteria: age 64-80, COPD with fev1 >/= 40%, ratio <0.7, >/= 2 copd exacerbations in past 1 year treated with either antibiotics or steroids, but >/= 14 days since last antibiotic or steroid for exacerbation, >/= 10 pack smoking history  Key Exclusion Criteria: non-COPD lung disease, pulse ox < 88% at rest on room air (i.e, restting o2 patients excluded), active phase of pulmonary rehabilitation (maintenance ok), previous lung surgery, QTc prolongation, HIV positive etc..   Key end points: Changes in respiratory symptoms and copd exacerbations and safety of DNX  Key features of DNX:   selective CXC chemokine receptor (CXCR2) antagonist . Positive trends in COPD exacerbation, symptoms and health status  Safety of DNX: As of April 2017 (5 completed Phase 1 studies, and 1 Phase 2A study of moderate copd patients with 45 patients):Marland Kitchen Most side effects are seen at 100mg  bid (current study max is 50mg  bid)  Phase 1 Normal Adults - dose </= 50mg  bid Study drug -    placebo  General All side effects mild - moderate. No changes in labs, EKG and vitals including PMN per PI meeting slide  x  Any adverse event 30% 13%  Headache 10-20% 0-17%   Fatigue/Lethargy 0-10% 0-3%  Abd Pain 0-11% (20% at 200mg  bid) 0%   Cough 0% 0%  Study withdrawal 0% 0%  SAE 0% 0%   - 3. COPD  COPD patients -data from IB April 2017 and PI meeting Spring 2017 Study drug Placebo  Efficacy  AECOPD and CAT scores better with drug after 1 year x  General No change in PMN, EKG, Vitals, Labs, Spirometry but produces mild to moderate headache and diarrhea  x  Any AE 52-77% 55-80%  Cystitis 5% at 75mg  bid 3%  Backpain 5% at 75mg  bid 0%  Nasopharyngitis 29% at 75mg  bid 33%  Headache 9% at 75mg  bid 8%  Upper abdominal pain 0% at 75mg  bid 6%  Infection rate 42% 50%  SAE 10 events 31 events   ........................................................................................................................................................................ Clinical Research Coordinator / Research RN note : This visit for Subject Jeni TANIQUA PETTINGER with 1945-12-22 on 12/02/2016 for the above protocol is Visit/Encounter # 7  and is for purpose of Visit 7 procedures . The consent for this encounter is under Protocol Version  Amendment 1 and  is currently IRB approved. Subject expressed continued interest and consent in continuing as a study subject. Subject thanked for participation. The purpose for this encounter : Physical exam  See other encounter for remaining coordinator notes.  In this visit 12/02/2016 the subject will be evaluated by investigator named Rexene Edison, NP . This Research officer, political party has verified that the investigator is uptodate with her training logs  Signed by [name], RN, BSN, [place] Nurse II [facility] [place], [place] 11:34  AM 12/02/2016

## 2016-12-02 NOTE — Progress Notes (Signed)
Title: Randomised, Double-Blind (Sponsor Open), Placebo-Controlled, Multicentre, Dose Ranging Study to Evaluate the Efficacy and Safety of Danirixin Tablets Administered Twice Daily Compared With Placebo for 24 Weeks in Adults With COPD  Sponsor: [employer] , Protoocol Number: [id number], NCT Trial #: [id number]  Synopsis:  Following baseline assessments collected over a 7 day period participants will be randomized (1:1:1:1:1:1) to receive one of five dose strengths of danirixin (5 milligram [mg], 10 mg, 25 mg, 35 mg and 50 mg) or placebo. Study treatment will be administered orally twice daily for 24 weeks. Participants will continue with their standard of care inhaled medications during study. Follow up will continue up to 28 days post last dose  Key Inclusion Criteria: age 79-80, COPD with fev1 >/= 40%, ratio <0.7, >/= 2 copd exacerbations in past 1 year treated with either antibiotics or steroids, but >/= 14 days since last antibiotic or steroid for exacerbation, >/= 10 pack smoking history  Key Exclusion Criteria: non-COPD lung disease, pulse ox < 88% at rest on room air (i.e, restting o2 patients excluded), active phase of pulmonary rehabilitation (maintenance ok), previous lung surgery, QTc prolongation, HIV positive etc..   Key end points: Changes in respiratory symptoms and copd exacerbations and safety of DNX  Key features of DNX:   selective CXC chemokine receptor (CXCR2) antagonist . Positive trends in COPD exacerbation, symptoms and health status  Safety of DNX: As of April 2017 (5 completed Phase 1 studies, and 1 Phase 2A study of moderate copd patients with 45 patients):Marland Kitchen Most side effects are seen at 100mg  bid (current study max is 50mg  bid)  Phase 1 Normal Adults - dose </= 50mg  bid Study drug -    placebo  General All side effects mild - moderate. No changes in labs, EKG and vitals including PMN per PI meeting slide  x  Any adverse event 30% 13%  Headache 10-20% 0-17%   Fatigue/Lethargy 0-10% 0-3%  Abd Pain 0-11% (20% at 200mg  bid) 0%   Cough 0% 0%  Study withdrawal 0% 0%  SAE 0% 0%   - 3. COPD  COPD patients -data from IB April 2017 and PI meeting Spring 2017 Study drug Placebo  Efficacy  AECOPD and CAT scores better with drug after 1 year x  General No change in PMN, EKG, Vitals, Labs, Spirometry but produces mild to moderate headache and diarrhea  x  Any AE 52-77% 55-80%  Cystitis 5% at 75mg  bid 3%  Backpain 5% at 75mg  bid 0%  Nasopharyngitis 29% at 75mg  bid 33%  Headache 9% at 75mg  bid 8%  Upper abdominal pain 0% at 75mg  bid 6%  Infection rate 42% 50%  SAE 10 events 31 events   ........................................................................................................................................................................ Clinical Research Coordinator / Research RN note : This visit for [name] with 1946-02-28 on 12/02/2016 for the above protocol is Visit/Encounter # 7  and is for purpose of Visit 7 proceduers. The consent for this encounter is under Protocol Version Amendment 1 and  is currently IRB approved. Subject expressed continued interest and consent in continuing as a study subject. Subject thanked for participation. The purpose for this encounter: Visit 7 procedures  Patient reports no change in health. Patient states she has a slight wheeze, which is at her baseline. Changes in two medications have been recorded on concomitant medication log.   Patient held study drug, maintenance inhalers and rescue medication use prior to visit.  Questionnaires completed in subject diary, Vital signs measured per protocol.  See paper research  document as source of vital signs.  Following Vital Signs ECG was completed, reviewed by Sub-Investigator.  Pre and Post dose spirometry was then completed per protocol, reviewed by Sub-Investigator.  Patient has been 100% compliant with  Study drug administration, patient  reminded to take with food.  Study drug count and new bottle confirmed with Sub-Investigator.  See Office visit encounter for notes by Sub-Investigator, Rexene Edison, NP.  Physical exam completed.  Pre-dose blood work completed and dosing from new bottle was witnessed by this Therapist, sports.  Patient reminders given and next visit scheduled.  In this visit 12/02/2016 the subject will be evaluated by investigator named Rexene Edison, NP  . This Research officer, political party has verified that the investigator is uptodate with her training logs  Signed by [name], RN, BSN, [place] Nurse II [facility] [place], [place] 9:28 AM 12/02/2016

## 2016-12-02 NOTE — Progress Notes (Signed)
@Patient  ID: [name], female    DOB: [birth year], 71 y.o.   MRN: [id number]  Chief Complaint  Patient presents with  . Research    Visit 7    Referring provider: Ria Bush, MD  HPI: 71 yo followed for Moderate GOLD II COPD prone to frequent COPD exacerbations   Title: Randomised, Double-Blind (Sponsor Open), Placebo-Controlled, Multicentre, Dose Ranging Study to Evaluate the Efficacy and Safety of Danirixin Tablets Administered Twice Daily Compared With Placebo for 24 Weeks in Adults With COPD  Sponsor: [employer] ,Protoocol Number: [id number] Trial #: [id number]  12/02/2016 Research Visit : Visit #7  Pt presents for research visit. She says she has been doing well since last ov . No significant change in her breathing status . No flare of cough or wheezing  Has daily intermittent wheezing that she has chronically.  Denies complaints today.  She denies questions about study .    Allergies  Allergen Reactions  . Codeine Nausea Only  . Prednisone Hives    In different places all over the body.  . Sulfonamide Derivatives Nausea Only    Immunization History  Administered Date(s) Administered  . H1N1 09/17/2008  . Influenza Split 07/18/2012  . Influenza Whole 08/10/2005, 06/24/2009, 07/11/2011  . Influenza, High Dose Seasonal PF 07/04/2016  . Influenza-Unspecified 07/24/2013, 06/30/2014, 06/30/2015  . Pneumococcal Conjugate-13 06/02/2014  . Pneumococcal Polysaccharide-23 08/10/2005, 04/21/2008, 06/25/2013  . Td 10/11/2005  . Zoster 09/16/2014    Past Medical History:  Diagnosis Date  . Acute cystitis without hematuria 12/22/2015  . Anemia   . Arthritis   . Asthma   . CAP (community acquired pneumonia) 02/08/2016  . Cataracts, bilateral    immature  . Chronic diastolic heart failure ([place] [facility] [place]) 09/19/2014  . Chronic insomnia   . Constipation    takes Miralax daily as needed  . COPD (chronic obstructive pulmonary disease) ([facility])    Albuterol  inhaler daily as needed;Duoneb daily as needed;Spiriva daily  . DDD (degenerative disc disease)    cervical - kyphosis with mod DD changes C5/6 and C6/7; lumbar - early DD at L2/3 ([place])  . Essential hypertension    was on meds but after appointment Dec 11 with Dr.C he took her off  . GERD (gastroesophageal reflux disease)    takes Protonix daily  . History of radiation therapy 05/30/11 to 07/07/11   rectum  . History of shingles   . Hyperlipemia    takes Lovastatin daily  . Legally blind in left eye, as defined in [place]   . Lung nodule    RLL nodule-53mm stable 2006, April 2009, and June 2009  . Obesity   . Osteopenia 12/2014   T -1.5 hip  . Pancreatitis 11/2014   ?zpack related vs gallstone pancreatitis with abnormal HIDA scan pending cholecystectomy  . Rectal cancer ([facility]) 04/2011   T3N0; s/p lap LAR, s/p ileostomy, s/p reversal, s/p chemo  . Rheumatic heart disease mitral stenosis    mod MS by echo 02/2014  . S/P AVR (aortic valve replacement) 2015   bioprosthetic ([facility])  . S/P MVR (mitral valve replacement) 2015   bioprosthetic ([facility])  . Seizures ([facility])    febrile seizures as a child  . Severe aortic valve stenosis  AVR 3/15 10/01/2013   mild-mod by echo 02/2014  . Sleep apnea    PT TOLD BORDERLINE-TRIED CPAP-DID NOT HELP-SHE DOES NOT USE CPAP  . Small bowel obstruction, partial 08/2013   reolved without NGT placement.   . Vitamin B12  deficiency 02/28/2015   Start B12 shots 02/2015     Tobacco History: History  Smoking Status  . Former Smoker  . Packs/day: 1.50  . Years: 10.00  . Types: Cigarettes  . Quit date: 12/28/1974  Smokeless Tobacco  . Never Used    Comment: quit smoking in 1976   Counseling given: Not Answered   Outpatient Encounter Prescriptions as of 12/02/2016  Medication Sig  . albuterol (PROVENTIL HFA;VENTOLIN HFA) 108 (90 Base) MCG/ACT inhaler Inhale 2 puffs into the lungs every 6 (six) hours as needed for wheezing.  Marland Kitchen amoxicillin (AMOXIL) 500 MG  capsule take 4 capsules by mouth 30-60 MINUTES PRIOR TO DENTAL APPT  . aspirin 81 MG tablet Take 81 mg by mouth at bedtime.   Marland Kitchen BIOTIN PO Take 1 tablet by mouth at bedtime.   . bisoprolol (ZEBETA) 5 MG tablet take 1/2 tablet by mouth once daily  . BREO ELLIPTA 200-25 MCG/INH AEPB inhale 1 puff INTO THE LUNGS daily  . Calcium Carbonate-Vitamin D (CALCIUM 600 + D PO) Take 600 mg by mouth at bedtime.   . Cholecalciferol (VITAMIN D) 2000 UNITS CAPS Take 2,000 Units by mouth at bedtime.   . furosemide (LASIX) 40 MG tablet Take one tablet by mouth daily 5 times a week.  Marland Kitchen ipratropium-albuterol (DUONEB) 0.5-2.5 (3) MG/3ML SOLN Take 3 mLs by nebulization every 6 (six) hours as needed. (Patient taking differently: Take 3 mLs by nebulization every 6 (six) hours as needed (for wheezing or shortness of breath). )  . lovastatin (MEVACOR) 40 MG tablet take 1 tablet by mouth at bedtime  . Miconazole Nitrate 2 % POWD Apply 1 application topically daily as needed (for skin). Apply to stomach and under breasts  . montelukast (SINGULAIR) 10 MG tablet take 1 tablet by mouth at bedtime  . pantoprazole (PROTONIX) 40 MG tablet Take 1 tablet (40 mg total) by mouth every other day.  . polycarbophil (FIBERCON) 625 MG tablet Take 625 mg by mouth daily.  . polyethylene glycol (MIRALAX / GLYCOLAX) packet Take 17 g by mouth daily.  . potassium chloride (K-DUR) 10 MEQ tablet take 2 tablets by mouth once daily (Patient taking differently: take 2 tablets by mouth once daily ( 5 days a week))  . research study medication Take by mouth.  . SPIRIVA HANDIHALER 18 MCG inhalation capsule inhale the contents of one capsule in the handihaler once daily  . traMADol (ULTRAM) 50 MG tablet Take 1 tablet (50 mg total) by mouth every 6 (six) hours as needed for moderate pain.  Marland Kitchen triamcinolone cream (KENALOG) 0.1 % Apply 1 application topically 2 (two) times daily. Apply to AA. (Patient not taking: Reported on 11/14/2016)  . vitamin B-12  (CYANOCOBALAMIN) 500 MCG tablet Take 500 mcg by mouth daily.   No facility-administered encounter medications on file as of 12/02/2016.      Review of Systems Neg see HPI    Physical Exam  There were no vitals taken for this visit.  GEN: A/Ox3; pleasant , NAD, elderly    HEENT:  [place]/AT,    THROAT-clear, no lesions, no postnasal drip or exudate noted.   NECK:  Supple w/ fair ROM; no JVD; normal carotid impulses w/o bruits; no thyromegaly or nodules palpated; no lymphadenopathy.    RESP  Clear  P & A; w/o, wheezes/ rales/ or rhonchi. no accessory muscle use, no dullness to percussion  CARD:  RRR, no m/r/g, no peripheral edema, pulses intact, no cyanosis or clubbing.  GI:  Soft & nt; nml bowel sounds; no organomegaly or masses detected.   Musco: Warm bil, no deformities or joint swelling noted.   Neuro: alert, no focal deficits noted.    Skin: Warm, no lesions or rashes    Lab Results:   Imaging: No results found.   Assessment & Plan:   No problem-specific Assessment & Plan notes found for this encounter.     Rexene Edison, NP 12/02/2016

## 2016-12-06 ENCOUNTER — Encounter (HOSPITAL_COMMUNITY): Payer: Self-pay

## 2016-12-08 ENCOUNTER — Encounter (HOSPITAL_COMMUNITY)
Admission: RE | Admit: 2016-12-08 | Discharge: 2016-12-08 | Disposition: A | Discharge status: Home / Self Care | Payer: Self-pay | Source: Ambulatory Visit | Attending: Internal Medicine | Admitting: Internal Medicine

## 2016-12-08 DIAGNOSIS — Z954 Presence of other heart-valve replacement: Secondary | ICD-10-CM | POA: Insufficient documentation

## 2016-12-08 DIAGNOSIS — R0600 Dyspnea, unspecified: Secondary | ICD-10-CM | POA: Insufficient documentation

## 2016-12-13 ENCOUNTER — Encounter (HOSPITAL_COMMUNITY): Payer: Self-pay

## 2016-12-13 HISTORY — PX: CATARACT EXTRACTION W/ INTRAOCULAR LENS IMPLANT: SHX1309

## 2016-12-15 ENCOUNTER — Encounter (HOSPITAL_COMMUNITY): Payer: Self-pay

## 2016-12-20 ENCOUNTER — Encounter (HOSPITAL_COMMUNITY): Payer: Self-pay

## 2016-12-22 ENCOUNTER — Encounter (HOSPITAL_COMMUNITY): Payer: Self-pay

## 2016-12-27 ENCOUNTER — Encounter (HOSPITAL_COMMUNITY): Payer: Self-pay

## 2016-12-27 ENCOUNTER — Ambulatory Visit (INDEPENDENT_AMBULATORY_CARE_PROVIDER_SITE_OTHER): Payer: Medicare Other | Admitting: Cardiovascular Disease

## 2016-12-27 ENCOUNTER — Encounter: Payer: Self-pay | Admitting: Cardiovascular Disease

## 2016-12-27 VITALS — BP 124/72 | HR 64 | Ht 66.0 in | Wt 216.0 lb

## 2016-12-27 DIAGNOSIS — E669 Obesity, unspecified: Secondary | ICD-10-CM | POA: Diagnosis not present

## 2016-12-27 DIAGNOSIS — I5032 Chronic diastolic (congestive) heart failure: Secondary | ICD-10-CM

## 2016-12-27 DIAGNOSIS — E66811 Obesity, class 1: Secondary | ICD-10-CM

## 2016-12-27 DIAGNOSIS — Z952 Presence of prosthetic heart valve: Secondary | ICD-10-CM | POA: Diagnosis not present

## 2016-12-27 DIAGNOSIS — I441 Atrioventricular block, second degree: Secondary | ICD-10-CM

## 2016-12-27 DIAGNOSIS — J449 Chronic obstructive pulmonary disease, unspecified: Secondary | ICD-10-CM

## 2016-12-27 NOTE — Patient Instructions (Signed)
Dr Croitoru recommends that you schedule a follow-up appointment in 1 year. You will receive a reminder letter in the mail two months in advance. If you don't receive a letter, please call our office to schedule the follow-up appointment.  If you need a refill on your cardiac medications before your next appointment, please call your pharmacy. 

## 2016-12-27 NOTE — Progress Notes (Signed)
Patient ID: [name], female   DOB: 21-Apr-1946, 71 y.o.   MRN: [id number] Patient ID: [name], female   DOB: Apr 10, 1946, 71 y.o.   MRN: [id number]     Cardiology Office Note    Date:  12/31/2016   ID:  Diane, [name] 05/21/1946, MRN [id number]  PCP:  Ria Bush, MD  Cardiologist:   Sanda Klein, MD   Chief Complaint  Patient presents with  . Follow-up    [facility] with activity. normal tiredness. Nausea once in a while.    History of Present Illness:  [name] is a 71 y.o. female with rheumatic valvular heart disease, COPD, morbid obesity, hyperlipidemia and continues to have exertional dyspnea, almost 3 years after combined AVR/MVR (bioprostheses) and good compliance with cardiopulmonary rehab.  She is still grieving from the double loss of her husband and her son, But she is socially engaged appears to be better adjusted.  She had improved breathing after starting bisoprolol (hyperdynamic LV with intracavitary gradient). 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 She had a St. Jude assurity MRI conditional pacemaker implanted and feels better since then (device implanted and followed by Dr. Curt Bears). She is back on her beta blocker. A follow-up echo that was performed just before pacemaker implantation shows normal function of her prostheses and normal left ventricular systolic function (see below).  Ace maker checked today shows normal device function. There is 48% atrial pacing and 62% ventricular pacing and there have been no episodes of atrial fibrillation or ventricular tachycardia. The heart rate histogram appears appropriate for her level of activity.  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 She almost never wheezes and uses albuterol very rarely. BP is only normal, but often low after exercise. She never experiences  dizziness or syncope.  The aortic valve prosthesis is a 21 mm [employer] MagnaEase pericardial tissue valve and the mitral valve is a 25 mm device of the same make.  Echo 09/18/2015:  - Left ventricle: The cavity size was normal. There was mild focal basal hypertrophy of the septum. Systolic function was vigorous.The estimated ejection fraction was in the range of 65% to 70%. There was dynamic obstruction. Peak velocity - 4.66m/s peakvelocity with Valsalva, peak gradient 22mmHg. Wall motion was normal; there were no regional wall motion abnormalities. - Aortic valve: A bioprosthesis was present and functioningnormally. Transvalvular velocity was minimally increased. There was no stenosis. Peak velocity (S): 224 cm/s. Peak gradient (S):20 mm Hg. - Mitral valve: Severely calcified annulus. A bioprosthesis waspresent and functioning normally. The findings are consistentwith mild to moderate stenosis. Mean gradient (D): 7 mm Hg. Peakgradient (D): 11 mm Hg. Valve area by pressure half-time: 1.76cm^2. - Left atrium: The atrium was moderately dilated.  Echo 06/29/2016:  - Left ventricle: The cavity size was normal. Wall thickness was increased in a pattern of mild LVH. Systolic function was normal.   The estimated ejection fraction was in the range of 60% to 65%. Indeterminant diastolic function. Wall motion was normal; there   were no regional wall motion abnormalities.  - Aortic valve: There was a bioprosthetic aortic valve. There did not appear to be significant regurgitation or stenosis. Mean   gradient (S): 17 mm Hg. - Mitral valve: There was a bioprosthetic mitral valve. There did not appear to be significant stenosis or regurgitation. The   residual  native mitral apparatus is severely calcified. Mean gradient (D): 6 mm Hg. Valve area by pressure half-time: 1.8 cm^2. - Left atrium: The atrium was mildly dilated. - Right ventricle: The cavity size was normal. Systolic function  was normal. - Tricuspid valve: Peak RV-RA gradient (S): 23 mm Hg. - Pulmonary arteries: PA peak pressure: 26 mm Hg (S). - Inferior vena cava: The vessel was normal in size. The respirophasic diameter changes were normal   Past Medical History:  Diagnosis Date  . Acute cystitis without hematuria 12/22/2015  . Anemia   . Arthritis   . Asthma   . CAP (community acquired pneumonia) 02/08/2016  . Cataracts, bilateral    immature  . Chronic diastolic heart failure ([place]) 09/19/2014  . Chronic insomnia   . Constipation    takes Miralax daily as needed  . COPD (chronic obstructive pulmonary disease) (HCC)    Albuterol inhaler daily as needed;Duoneb daily as needed;Spiriva daily  . DDD (degenerative disc disease)    cervical - kyphosis with mod DD changes C5/6 and C6/7; lumbar - early DD at L2/3 ([place])  . Essential hypertension    was on meds but after appointment Dec 11 with Dr.C he took her off  . GERD (gastroesophageal reflux disease)    takes Protonix daily  . History of radiation therapy 05/30/11 to 07/07/11   rectum  . History of shingles   . Hyperlipemia    takes Lovastatin daily  . Legally blind in left eye, as defined in [place]   . Lung nodule    RLL nodule-59mm stable 2006, April 2009, and June 2009  . Obesity   . Osteopenia 12/2014   T -1.5 hip  . Pancreatitis 11/2014   ?zpack related vs gallstone pancreatitis with abnormal HIDA scan pending cholecystectomy  . Rectal cancer ([place]) 04/2011   T3N0; s/p lap LAR, s/p ileostomy, s/p reversal, s/p chemo  . Rheumatic heart disease mitral stenosis    mod MS by echo 02/2014  . S/P AVR (aortic valve replacement) 2015   bioprosthetic ([place])  . S/P MVR (mitral valve replacement) 2015   bioprosthetic ([place])  . Seizures ([place])    febrile seizures as a child  . Severe aortic valve stenosis  AVR 3/15 10/01/2013   mild-mod by echo 02/2014  . Sleep apnea    PT TOLD BORDERLINE-TRIED CPAP-DID NOT HELP-SHE DOES NOT USE CPAP  . Small bowel  obstruction, partial 08/2013   reolved without NGT placement.   . Vitamin B12 deficiency 02/28/2015   Start B12 shots 02/2015     Past Surgical History:  Procedure Laterality Date  . AORTIC VALVE REPLACEMENT N/A 12/12/2013   Procedure: AORTIC VALVE REPLACEMENT (AVR);  Surgeon: Gaye Pollack, MD; Service: Open Heart Surgery  . BACK SURGERY  2006, 2007   2006 SPACER, 2007 decompression and fusion L4/5  . BOWEL RESECTION  04/02/2012   Procedure: SMALL BOWEL RESECTION;  Surgeon: Stark Klein, MD;  Location: [facility];  Service: General;  Laterality: N/A;  . CARPAL TUNNEL RELEASE Bilateral   . CATARACT EXTRACTION W/ INTRAOCULAR LENS IMPLANT Left 10/18/2016  . CHOLECYSTECTOMY N/A 01/21/2015   chronic cholecystitis, Stark Klein, MD  . COLON RESECTION  08/25/2011   Procedure: COLON RESECTION LAPAROSCOPIC;  Surgeon: Stark Klein, MD;  Location: [facility];  Service: General;  Laterality: N/A;  Laparoscopic Assisted Low Anterior Resection Diverting Ostomy and onQ pain pump  . COLONOSCOPY  03/2013   1 polyp, rpt 3 yrs Ardis Hughs)  . COLONOSCOPY WITH PROPOFOL N/A  06/09/2016   patent colo-colonic anastomosis, rpt 5 yrs Ardis Hughs)  . EP IMPLANTABLE DEVICE N/A 06/16/2016   Procedure: Pacemaker Implant;  Surgeon: Will Meredith Leeds, MD;  Location: [facility];  Service: Cardiovascular;  Laterality: N/A;  . FOOT SURGERY  left foot   hammer toe  . HAND SURGERY  Bil   carpal tunnel  . ILEOSTOMY  08/25/2011  . ILEOSTOMY CLOSURE  04/02/2012   Procedure: ILEOSTOMY TAKEDOWN;  Surgeon: Stark Klein, MD;  Location: [facility];  Service: General;  Laterality: N/A;  . INTRAOPERATIVE TRANSESOPHAGEAL ECHOCARDIOGRAM N/A 12/12/2013   Procedure: INTRAOPERATIVE TRANSESOPHAGEAL ECHOCARDIOGRAM;  Surgeon: Gaye Pollack, MD;  Location: [facility];  Service: Open Heart Surgery;  Laterality: N/A;  . LEFT AND RIGHT HEART CATHETERIZATION WITH CORONARY ANGIOGRAM N/A 11/27/2013   Procedure: LEFT AND RIGHT HEART CATHETERIZATION WITH CORONARY  ANGIOGRAM;  Surgeon: Sanda Klein, MD;  Location: [facility];  Service: Cardiovascular;  Laterality: N/A;  . MITRAL VALVE REPLACEMENT N/A 12/12/2013   Procedure: MITRAL VALVE (MV) REPLACEMENT OR REPAIR;  Surgeon: Gaye Pollack, MD; Service: Open Heart Surgery  . TEE WITHOUT CARDIOVERSION N/A 11/29/2013   Procedure: TRANSESOPHAGEAL ECHOCARDIOGRAM (TEE);  Surgeon: Sueanne Margarita, MD;  Location[facility];  Service: Cardiovascular;  Laterality: N/A;  . TONSILLECTOMY      Current Outpatient Prescriptions  Medication Sig Dispense Refill  . albuterol (PROVENTIL HFA;VENTOLIN HFA) 108 (90 Base) MCG/ACT inhaler Inhale 2 puffs into the lungs every 6 (six) hours as needed for wheezing. 1 Inhaler 2  . amoxicillin (AMOXIL) 500 MG capsule take 4 capsules by mouth 30-60 MINUTES PRIOR TO DENTAL APPT 4 capsule 12  . aspirin 81 MG tablet Take 81 mg by mouth at bedtime.     Marland Kitchen BIOTIN PO Take 1 tablet by mouth at bedtime.     . bisoprolol (ZEBETA) 5 MG tablet take 1/2 tablet by mouth once daily 15 tablet 11  . BREO ELLIPTA 200-25 MCG/INH AEPB inhale 1 puff INTO THE LUNGS daily 60 each 6  . Calcium Carbonate-Vitamin D (CALCIUM 600 + D PO) Take 600 mg by mouth at bedtime.     . Cholecalciferol (VITAMIN D) 2000 UNITS CAPS Take 2,000 Units by mouth at bedtime.     . furosemide (LASIX) 40 MG tablet Take one tablet by mouth daily 5 times a week. 25 tablet 11  . ipratropium-albuterol (DUONEB) 0.5-2.5 (3) MG/3ML SOLN Take 3 mLs by nebulization every 6 (six) hours as needed. (Patient taking differently: Take 3 mLs by nebulization every 6 (six) hours as needed (for wheezing or shortness of breath). ) 360 mL 0  . lovastatin (MEVACOR) 40 MG tablet take 1 tablet by mouth at bedtime 30 tablet 3  . Miconazole Nitrate 2 % POWD Apply 1 application topically daily as needed (for skin). Apply to stomach and under breasts    . montelukast (SINGULAIR) 10 MG tablet take 1 tablet by mouth at bedtime 30 tablet 5  . pantoprazole  (PROTONIX) 40 MG tablet Take 1 tablet (40 mg total) by mouth every other day.    . polycarbophil (FIBERCON) 625 MG tablet Take 625 mg by mouth daily.    . polyethylene glycol (MIRALAX / GLYCOLAX) packet Take 17 g by mouth daily. 14 each 0  . potassium chloride (K-DUR) 10 MEQ tablet take 2 tablets by mouth once daily (Patient taking differently: take 2 tablets by mouth once daily ( 5 days a week)) 60 tablet 11  . research study medication Take by mouth.    Marland Kitchen  SPIRIVA HANDIHALER 18 MCG inhalation capsule inhale the contents of one capsule in the handihaler once daily 30 capsule 5  . traMADol (ULTRAM) 50 MG tablet Take 1 tablet (50 mg total) by mouth every 6 (six) hours as needed for moderate pain. 30 tablet 0  . triamcinolone cream (KENALOG) 0.1 % Apply 1 application topically 2 (two) times daily. Apply to AA. 45 g 0  . vitamin B-12 (CYANOCOBALAMIN) 500 MCG tablet Take 500 mcg by mouth daily.     No current facility-administered medications for this visit.     Allergies:   Codeine; Prednisone; and Sulfonamide derivatives   Social History   Social History  . Marital status: Widowed    Spouse name: N/A  . Number of children: N/A  . Years of education: N/A   Occupational History  . retired    Social History Main Topics  . Smoking status: Former Smoker    Packs/day: 1.50    Years: 10.00    Types: Cigarettes    Quit date: 12/28/1974  . Smokeless tobacco: Never Used     Comment: quit smoking in 13-Jan-1975  . Alcohol use 0.0 oz/week     Comment: rarely wine  . Drug use: No  . Sexual activity: No   Other Topics Concern  . None   Social History Narrative   Widow. Husband died 2015-04-15 MVA. 1 cat.    2 sons; 1 grandchild. One son died of MI 29   Married 01/12/1965   Retired-[employer], volunteers at [employer]   Activity: pulm rehab   Diet: some water, fruits/vegetables daily     Family History:  The patient's family history includes CAD (age of onset: 36) in her son; Diabetes in her mother; Heart  attack in her maternal grandfather; Heart attack (age of onset: 49) in her son; Heart disease in her mother; Hypertension in her mother; Lung cancer in her father; Stroke in her mother.   ROS:   Please see the history of present illness.    Review of Systems  All other systems reviewed and are negative.  All other systems reviewed and are negative.   PHYSICAL EXAM:   VS:  BP 124/72   Pulse 64   Ht 5\' 6"  (1.676 m)   Wt 98 kg (216 lb)   SpO2 95% Comment: 92% when entering room, 95% after 5 mins of sitting. No O2  BMI 34.86 kg/m    GEN: Well nourished, well developed, in no acute distress  HEENT: normal  Neck: no JVD, carotid bruits, or masses Cardiac: RRR; 2/6 early peaking aortic ejection murmur; no diastolic murmurs, rubs, or gallops,no edema . Healthy left subclavian pacemaker site Respiratory:  clear to auscultation bilaterally, normal work of breathing GI: soft, nontender, nondistended, + BS MS: no deformity or atrophy  Skin: warm and dry, no rash Neuro:  Alert and Oriented x 3, Strength and sensation are intact Psych: euthymic mood, full affect  Wt Readings from Last 3 Encounters:  12/27/16 98 kg (216 lb)  11/14/16 97.6 kg (215 lb 1.9 oz)  11/07/16 98 kg (216 lb)      Studies/Labs Reviewed:   EKG:  EKG is not ordered today.    Recent Labs: 11/07/2016: ALT 11; BUN 20; Creatinine, Ser 1.10; Hemoglobin 12.9; Platelets 174.0; Potassium 4.0; Sodium 139; TSH 1.80   Lipid Panel    Component Value Date/Time   CHOL 186 11/07/2016 1106   TRIG 138.0 11/07/2016 1106   HDL 54.00 11/07/2016 1106   CHOLHDL 3  11/07/2016 1106   VLDL 27.6 11/07/2016 1106   LDLCALC 105 (H) 11/07/2016 1106   LDLDIRECT 111.0 06/26/2015 0928    ASSESSMENT:    1. Chronic diastolic heart failure ([place])   2. S/P aortic and mitral valve bioprostheses - 12/2013   3. 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 Obesity, Class I, BMI 30-34.9   5. Chronic obstructive pulmonary disease, unspecified COPD  type ([place])      PLAN:  In order of problems listed above:  1. Chronic diastolic heart failure (HCC) , NYHA class 2:  NYHA class 2, exercising regularly. No edema. 2. AVR/MVR. Aortic and mitral valve gradients are normal. Slightly improved mean gradient across the mitral valve. 3. 2nd deg AVB: VIP turned on at January appt with Dr. Curt Bears. Despite this, ventricular pacing still occur 62% of the time. She needs to beta blocker due to her hyperdynamic/hypertrophic left ventricle. 4. Obesity: I am sure this is playing a substantial role in her dyspnea. Weight loss encouraged.  5. COPD: continue pulmonary rehabilitation and bronchodilators   Medication Adjustments/Labs and Tests Ordered: Current medicines are reviewed at length with the patient today.  Concerns regarding medicines are outlined above.  Medication changes, Labs and Tests ordered today are listed below. Patient Instructions  Dr Sallyanne Kuster recommends that you schedule a follow-up appointment in 1 year. You will receive a reminder letter in the mail two months in advance. If you don't receive a letter, please call our office to schedule the follow-up appointment.  If you need a refill on your cardiac medications before your next appointment, please call your pharmacy.     Signed, Sanda Klein, MD  12/31/2016 2:32 PM    [address], [place], [place]  [address] Phone: [phone]; Fax: [phone]

## 2016-12-28 ENCOUNTER — Other Ambulatory Visit: Payer: Self-pay | Admitting: Family Medicine

## 2016-12-28 DIAGNOSIS — Z1231 Encounter for screening mammogram for malignant neoplasm of breast: Secondary | ICD-10-CM

## 2016-12-29 ENCOUNTER — Encounter (HOSPITAL_COMMUNITY)
Admission: RE | Admit: 2016-12-29 | Discharge: 2016-12-29 | Disposition: A | Discharge status: Home / Self Care | Payer: Self-pay | Source: Ambulatory Visit | Attending: Internal Medicine | Admitting: Internal Medicine

## 2016-12-29 DIAGNOSIS — Z006 Encounter for examination for normal comparison and control in clinical research program: Secondary | ICD-10-CM

## 2016-12-29 DIAGNOSIS — J449 Chronic obstructive pulmonary disease, unspecified: Secondary | ICD-10-CM

## 2016-12-29 LAB — CUP PACEART INCLINIC DEVICE CHECK
Date Time Interrogation Session: 20180322112212
Implantable Lead Implant Date: 20170907
Implantable Lead Implant Date: 20170907
Implantable Lead Location: 753859
Implantable Lead Location: 753860
Implantable Pulse Generator Implant Date: 20170907
Pulse Gen Model: 2272
Pulse Gen Serial Number: 7945283

## 2016-12-29 NOTE — Progress Notes (Signed)
Title: Randomised, Double-Blind (Sponsor Open), Placebo-Controlled, Multicentre, Dose Ranging Study to Evaluate the Efficacy and Safety of Danirixin Tablets Administered Twice Daily Compared With Placebo for 24 Weeks in Adults With COPD  Sponsor: [employer] , Protoocol Number: [id number], NCT Trial #: [id number]  Synopsis:  Following baseline assessments collected over a 7 day period participants will be randomized (1:1:1:1:1:1) to receive one of five dose strengths of danirixin (5 milligram [mg], 10 mg, 25 mg, 35 mg and 50 mg) or placebo. Study treatment will be administered orally twice daily for 24 weeks. Participants will continue with their standard of care inhaled medications during study. Follow up will continue up to 28 days post last dose  Key Inclusion Criteria: age 10-80, COPD with fev1 >/= 40%, ratio <0.7, >/= 2 copd exacerbations in past 1 year treated with either antibiotics or steroids, but >/= 14 days since last antibiotic or steroid for exacerbation, >/= 10 pack smoking history  Key Exclusion Criteria: non-COPD lung disease, pulse ox < 88% at rest on room air (i.e, restting o2 patients excluded), active phase of pulmonary rehabilitation (maintenance ok), previous lung surgery, QTc prolongation, HIV positive etc..   Key end points: Changes in respiratory symptoms and copd exacerbations and safety of DNX  Key features of DNX:   selective CXC chemokine receptor (CXCR2) antagonist . Positive trends in COPD exacerbation, symptoms and health status  Safety of DNX: As of April 2017 (5 completed Phase 1 studies, and 1 Phase 2A study of moderate copd patients with 45 patients):Marland Kitchen Most side effects are seen at 100mg  bid (current study max is 50mg  bid)  Phase 1 Normal Adults - dose </= 50mg  bid Study drug -    placebo  General All side effects mild - moderate. No changes in labs, EKG and vitals including PMN per PI meeting slide  x  Any adverse event 30% 13%  Headache 10-20% 0-17%   Fatigue/Lethargy 0-10% 0-3%  Abd Pain 0-11% (20% at 200mg  bid) 0%   Cough 0% 0%  Study withdrawal 0% 0%  SAE 0% 0%   - 3. COPD  COPD patients -data from IB April 2017 and PI meeting Spring 2017 Study drug Placebo  Efficacy  AECOPD and CAT scores better with drug after 1 year x  General No change in PMN, EKG, Vitals, Labs, Spirometry but produces mild to moderate headache and diarrhea  x  Any AE 52-77% 55-80%  Cystitis 5% at 75mg  bid 3%  Backpain 5% at 75mg  bid 0%  Nasopharyngitis 29% at 75mg  bid 33%  Headache 9% at 75mg  bid 8%  Upper abdominal pain 0% at 75mg  bid 6%  Infection rate 42% 50%  SAE 10 events 31 events   ........................................................................................................................................................................  Clinical Research Coordinator / Research RN note : This visit for [name] with 01/22/1946 on 12/29/2016 for the above protocol is Visit/Encounter # 8 and is for purpose of follow up . The consent for this encounter is under Protocol Version Amendment 1 and is currently IRB approved. Subject expressed continued interest and consent in continuing as a study subject. Subject thanked for participation. The purpose and/or following were chief complaints for this encounter : questionnaires, medication dispensing, follow up.  Patient completed the questionnaires in her ePRO diary.  Subject has been compliant with use of ediary.  Subject reports no changes in health or medications, has been feeling well (no AE, no con med changes).  Subject has done well following completion of both of her cataract surgeries.   Subject  has been 100% compliant with investigational product dosing, new bottle assigned.  Returned pill count and new bottle assignment verified by Dr. Brand Males, MD.  Patient reminded to continue to take investigational product with food twice a day, and patient confirmed she takes dose  each morning with breakfast and each evening with her evening meal. Visit 9 was scheduled with subject.  Subject reminded to contact us for any questions and thanked for study participation.  Signed by [name], RN, BSN, [facility] [place], [place] 11:45 AM 12/29/2016

## 2016-12-31 DIAGNOSIS — I441 Atrioventricular block, second degree: Secondary | ICD-10-CM

## 2016-12-31 HISTORY — DX: Atrioventricular block, second degree: I44.1

## 2017-01-03 ENCOUNTER — Encounter (HOSPITAL_COMMUNITY): Payer: Self-pay

## 2017-01-05 ENCOUNTER — Encounter (HOSPITAL_COMMUNITY)
Admission: RE | Admit: 2017-01-05 | Discharge: 2017-01-05 | Disposition: A | Discharge status: Home / Self Care | Payer: Self-pay | Source: Ambulatory Visit | Attending: Internal Medicine | Admitting: Internal Medicine

## 2017-01-10 ENCOUNTER — Encounter (HOSPITAL_COMMUNITY)
Admission: RE | Admit: 2017-01-10 | Discharge: 2017-01-10 | Disposition: A | Discharge status: Home / Self Care | Payer: Medicare Other | Source: Ambulatory Visit | Attending: Internal Medicine | Admitting: Internal Medicine

## 2017-01-10 DIAGNOSIS — Z954 Presence of other heart-valve replacement: Secondary | ICD-10-CM | POA: Insufficient documentation

## 2017-01-10 DIAGNOSIS — R0600 Dyspnea, unspecified: Secondary | ICD-10-CM | POA: Insufficient documentation

## 2017-01-12 ENCOUNTER — Encounter (HOSPITAL_COMMUNITY)
Admission: RE | Admit: 2017-01-12 | Discharge: 2017-01-12 | Disposition: A | Discharge status: Home / Self Care | Payer: Self-pay | Source: Ambulatory Visit | Attending: Internal Medicine | Admitting: Internal Medicine

## 2017-01-17 ENCOUNTER — Encounter: Payer: Self-pay | Admitting: Internal Medicine

## 2017-01-17 ENCOUNTER — Encounter (HOSPITAL_COMMUNITY)
Admission: RE | Admit: 2017-01-17 | Discharge: 2017-01-17 | Disposition: A | Discharge status: Home / Self Care | Payer: Self-pay | Source: Ambulatory Visit | Attending: Internal Medicine | Admitting: Internal Medicine

## 2017-01-17 ENCOUNTER — Telehealth: Payer: Self-pay | Admitting: Internal Medicine

## 2017-01-17 NOTE — Telephone Encounter (Signed)
Research note and clinical MD eval note regarding diagnosis of asthma v copd in [place] 08/31/1946. She is being followed for COPD in pulmonary clinic and on COPD research study but past medical hx in chart has mention of asthma. In my opinion as her treating pulmonary physician she has copd. She does not have asthma. This is based on   1. Age 71 and adult onset of respiratory complaints 2. Smoking history:  reports that she quit smoking about 42 years ago. Her smoking use included Cigarettes. She has a 15.00 pack-year smoking history. She has never used smokeless tobacco. 3. PFT pattern of obstruction with lower DLCO  Results for VESTA, WHEELAND (MRN [id number]) as of 01/17/2017 05:04  Ref. Range 04/01/2016 10:10  FVC-Pre Latest Units: L 2.22  FVC-%Pred-Pre Latest Units: % 68  FEV1-Pre Latest Units: L 1.35  FEV1-%Pred-Pre Latest Units: % 55  Pre FEV1/FVC ratio Latest Units: % 61  FEV1FVC-%Pred-Pre Latest Units: % 79  FEF 25-75 Pre Latest Units: L/sec 0.62  FEF2575-%Pred-Pre Latest Units: % 31  FEV6-Pre Latest Units: L 2.18  FEV6-%Pred-Pre Latest Units: % 70  Pre FEV6/FVC Ratio Latest Units: % 98  FEV6FVC-%Pred-Pre Latest Units: % 102  FVC-Post Latest Units: L 2.33  FVC-%Pred-Post Latest Units: % 72  FVC-%Change-Post Latest Units: % 5  FEV1-Post Latest Units: L 1.47  FEV1-%Pred-Post Latest Units: % 60  FEV1-%Change-Post Latest Units: % 9  Post FEV1/FVC ratio Latest Units: % 63  FEV1FVC-%Change-Post Latest Units: % 4  FEF 25-75 Post Latest Units: L/sec 0.83  FEF2575-%Pred-Post Latest Units: % 41  FEF2575-%Change-Post Latest Units: % 34  FEV6-Post Latest Units: L 2.31  FEV6-%Pred-Post Latest Units: % 75  FEV6-%Change-Post Latest Units: % 6  Post FEV6/FVC ratio Latest Units: % 99  FEV6FVC-%Pred-Post Latest Units: % 103  FEV6FVC-%Change-Post Latest Units: % 1  TLC Latest Units: L 5.00  TLC % pred Latest Units: % 93  RV Latest Units: L 2.79  RV % pred Latest Units: % 121   DLCO unc Latest Units: ml/min/mmHg 19.21  DLCO unc % pred Latest Units: % 71  DL/VA Latest Units: ml/min/mmHg/L 4.84  DL/VA % pred Latest Units: % 95    Dr. Brand Males, [name], [facility].C.P Pulmonary and Critical Care Medicine Staff Physician [facility] Pager: [phone], If no answer or between  15:00h - 7:00h: call [phone]  01/17/2017 5:07 AM

## 2017-01-19 ENCOUNTER — Telehealth: Payer: Self-pay | Admitting: Cardiology

## 2017-01-19 ENCOUNTER — Encounter (HOSPITAL_COMMUNITY)
Admission: RE | Admit: 2017-01-19 | Discharge: 2017-01-19 | Disposition: A | Discharge status: Home / Self Care | Payer: Self-pay | Source: Ambulatory Visit | Attending: Internal Medicine | Admitting: Internal Medicine

## 2017-01-19 NOTE — Telephone Encounter (Signed)
Spoke w/ pt and she was very confused about her remote transmission that is scheduled for Monday 01-23-17. I tried to explain to patient that it is a 91 day check that is processed. Her home monitor will send the information to the office between 12 - 5 AM and that she would only have to worry about it if she gets a call. Pt stated that she is "still very confused and that she will just learn to live with it".

## 2017-01-23 ENCOUNTER — Telehealth: Payer: Self-pay | Admitting: Cardiology

## 2017-01-23 ENCOUNTER — Ambulatory Visit (INDEPENDENT_AMBULATORY_CARE_PROVIDER_SITE_OTHER): Payer: Medicare Other | Admitting: *Deleted

## 2017-01-23 DIAGNOSIS — I441 Atrioventricular block, second degree: Secondary | ICD-10-CM | POA: Diagnosis not present

## 2017-01-23 NOTE — Progress Notes (Signed)
Remote pacemaker transmission.   

## 2017-01-23 NOTE — Telephone Encounter (Signed)
Spoke with pt and reminded pt of remote transmission that is due today. Pt verbalized understanding.   

## 2017-01-24 ENCOUNTER — Encounter (HOSPITAL_COMMUNITY)
Admission: RE | Admit: 2017-01-24 | Discharge: 2017-01-24 | Disposition: A | Discharge status: Home / Self Care | Payer: Self-pay | Source: Ambulatory Visit | Attending: Internal Medicine | Admitting: Internal Medicine

## 2017-01-24 LAB — CUP PACEART REMOTE DEVICE CHECK
Battery Remaining Longevity: 119 mo
Battery Remaining Percentage: 95.5 %
Battery Voltage: 3.02 V
Brady Statistic AP VP Percent: 48 %
Brady Statistic AP VS Percent: 2.1 %
Brady Statistic AS VP Percent: 13 %
Brady Statistic AS VS Percent: 37 %
Brady Statistic RA Percent Paced: 50 %
Brady Statistic RV Percent Paced: 61 %
Date Time Interrogation Session: 20180416195739
Implantable Lead Implant Date: 20170907
Implantable Lead Implant Date: 20170907
Implantable Lead Location: 753859
Implantable Lead Location: 753860
Implantable Pulse Generator Implant Date: 20170907
Lead Channel Impedance Value: 480 Ohm
Lead Channel Impedance Value: 690 Ohm
Lead Channel Pacing Threshold Amplitude: 0.75 V
Lead Channel Pacing Threshold Amplitude: 0.75 V
Lead Channel Pacing Threshold Pulse Width: 0.5 ms
Lead Channel Pacing Threshold Pulse Width: 0.5 ms
Lead Channel Sensing Intrinsic Amplitude: 12 mV
Lead Channel Sensing Intrinsic Amplitude: 3.7 mV
Lead Channel Setting Pacing Amplitude: 1 V
Lead Channel Setting Pacing Amplitude: 2 V
Lead Channel Setting Pacing Pulse Width: 0.5 ms
Lead Channel Setting Sensing Sensitivity: 2 mV
Pulse Gen Model: 2272
Pulse Gen Serial Number: 7945283

## 2017-01-25 ENCOUNTER — Encounter: Payer: Self-pay | Admitting: Cardiology

## 2017-01-25 ENCOUNTER — Other Ambulatory Visit: Payer: Self-pay | Admitting: Family Medicine

## 2017-01-26 ENCOUNTER — Encounter (HOSPITAL_COMMUNITY): Payer: Self-pay

## 2017-01-31 ENCOUNTER — Encounter (HOSPITAL_COMMUNITY): Payer: Self-pay

## 2017-01-31 DIAGNOSIS — Z006 Encounter for examination for normal comparison and control in clinical research program: Secondary | ICD-10-CM

## 2017-01-31 DIAGNOSIS — J449 Chronic obstructive pulmonary disease, unspecified: Secondary | ICD-10-CM

## 2017-01-31 NOTE — Progress Notes (Signed)
Title: Randomised, Double-Blind (Sponsor Open), Placebo-Controlled, Multicentre, Dose Ranging Study to Evaluate the Efficacy and Safety of Danirixin Tablets Administered Twice Daily Compared With Placebo for 24 Weeks in Adults With COPD  Sponsor: [employer] , Protoocol Number: [id number], NCT Trial #: [id number]  Synopsis:  Following baseline assessments collected over a 7 day period participants will be randomized (1:1:1:1:1:1) to receive one of five dose strengths of danirixin (5 milligram [mg], 10 mg, 25 mg, 35 mg and 50 mg) or placebo. Study treatment will be administered orally twice daily for 24 weeks. Participants will continue with their standard of care inhaled medications during study. Follow up will continue up to 28 days post last dose  Key Inclusion Criteria: age 3-80, COPD with fev1 >/= 40%, ratio <0.7, >/= 2 copd exacerbations in past 1 year treated with either antibiotics or steroids, but >/= 14 days since last antibiotic or steroid for exacerbation, >/= 10 pack smoking history  Key Exclusion Criteria: non-COPD lung disease, pulse ox < 88% at rest on room air (i.e, restting o2 patients excluded), active phase of pulmonary rehabilitation (maintenance ok), previous lung surgery, QTc prolongation, HIV positive etc..   Key end points: Changes in respiratory symptoms and copd exacerbations and safety of DNX  Key features of DNX:   selective CXC chemokine receptor (CXCR2) antagonist . Positive trends in COPD exacerbation, symptoms and health status  Safety of DNX: As of April 2017 (5 completed Phase 1 studies, and 1 Phase 2A study of moderate copd patients with 45 patients):Marland Kitchen Most side effects are seen at 100mg  bid (current study max is 50mg  bid)  Phase 1 Normal Adults - dose </= 50mg  bid Study drug -    placebo  General All side effects mild - moderate. No changes in labs, EKG and vitals including PMN per PI meeting slide  x  Any adverse event 30% 13%  Headache 10-20% 0-17%   Fatigue/Lethargy 0-10% 0-3%  Abd Pain 0-11% (20% at 200mg  bid) 0%   Cough 0% 0%  Study withdrawal 0% 0%  SAE 0% 0%   - 3. COPD  COPD patients -data from IB April 2017 and PI meeting Spring 2017 Study drug Placebo  Efficacy  AECOPD and CAT scores better with drug after 1 year x  General No change in PMN, EKG, Vitals, Labs, Spirometry but produces mild to moderate headache and diarrhea  x  Any AE 52-77% 55-80%  Cystitis 5% at 75mg  bid 3%  Backpain 5% at 75mg  bid 0%  Nasopharyngitis 29% at 75mg  bid 33%  Headache 9% at 75mg  bid 8%  Upper abdominal pain 0% at 75mg  bid 6%  Infection rate 42% 50%  SAE 10 events 31 events   ........................................................................................................................................................................ Clinical Research Coordinator / Research RN note : This visit for [name] with DOB: 01-07-1946 on 01/31/2017 for the above protocol is Visit # 9  and is for purpose of research . The consent for this encounter is under Protocol Version Amendment 2 and is currently IRB approved. Subject expressed continued interest and consent in continuing as a study subject. Subject confirmed that there was no change in contact information (e.g. address, telephone, email). Subject thanked for participation in research and contribution to science.   All required procedures completed per the above mentioned protocol. Subject mentioned she was diagnosis with a UTI on 22/Apr/2018, symptoms began on 23/Apr/2018. Subject started on Macrobid and AZO.  Refer to the subjects paper source binder for further documentation.   The subject also mentioned that  she took her last pill on 22/Apr/2018 at 1700. Subjects Visit is within window per protocol, however the IP bottles contain 64 tablets which was not enough to cover the +/- 3 days allowed in the protocol visits. A new bottle of IP was dispensed today, 24/Apr/2018 to the  subject. Subject provided reminders for Visit 10, and visit has been scheduled for 18/May/2018.   Signed by [place] Bing, [place] Coordinator [facility]  [place], [place] 3:37 PM 01/31/2017

## 2017-02-02 ENCOUNTER — Encounter (HOSPITAL_COMMUNITY)
Admission: RE | Admit: 2017-02-02 | Discharge: 2017-02-02 | Disposition: A | Discharge status: Home / Self Care | Payer: Self-pay | Source: Ambulatory Visit | Attending: Internal Medicine | Admitting: Internal Medicine

## 2017-02-07 ENCOUNTER — Encounter (HOSPITAL_COMMUNITY)
Admission: RE | Admit: 2017-02-07 | Discharge: 2017-02-07 | Disposition: A | Discharge status: Home / Self Care | Payer: Self-pay | Source: Ambulatory Visit | Attending: Internal Medicine | Admitting: Internal Medicine

## 2017-02-07 DIAGNOSIS — Z954 Presence of other heart-valve replacement: Secondary | ICD-10-CM | POA: Insufficient documentation

## 2017-02-07 DIAGNOSIS — R0600 Dyspnea, unspecified: Secondary | ICD-10-CM | POA: Insufficient documentation

## 2017-02-09 ENCOUNTER — Encounter (HOSPITAL_COMMUNITY): Payer: Self-pay

## 2017-02-13 ENCOUNTER — Ambulatory Visit
Admission: RE | Admit: 2017-02-13 | Discharge: 2017-02-13 | Disposition: A | Discharge status: Home / Self Care | Payer: Medicare Other | Source: Ambulatory Visit | Attending: Family Medicine | Admitting: Family Medicine

## 2017-02-13 DIAGNOSIS — Z1231 Encounter for screening mammogram for malignant neoplasm of breast: Secondary | ICD-10-CM

## 2017-02-14 ENCOUNTER — Encounter: Payer: Self-pay | Admitting: *Deleted

## 2017-02-14 ENCOUNTER — Encounter (HOSPITAL_COMMUNITY): Payer: Self-pay

## 2017-02-16 ENCOUNTER — Encounter (HOSPITAL_COMMUNITY)
Admission: RE | Admit: 2017-02-16 | Discharge: 2017-02-16 | Disposition: A | Discharge status: Home / Self Care | Payer: Self-pay | Source: Ambulatory Visit | Attending: Internal Medicine | Admitting: Internal Medicine

## 2017-02-21 ENCOUNTER — Encounter (HOSPITAL_COMMUNITY)
Admission: RE | Admit: 2017-02-21 | Discharge: 2017-02-21 | Disposition: A | Discharge status: Home / Self Care | Payer: Self-pay | Source: Ambulatory Visit | Attending: Internal Medicine | Admitting: Internal Medicine

## 2017-02-22 ENCOUNTER — Telehealth: Payer: Self-pay | Admitting: Internal Medicine

## 2017-02-22 MED ORDER — DOXYCYCLINE HYCLATE 100 MG PO TABS: 100.0000 mg | ORAL_TABLET | Freq: Two times a day (BID) | ORAL | 0 refills | Status: DC

## 2017-02-22 MED ORDER — PREDNISONE 10 MG PO TABS: ORAL_TABLET | ORAL | 0 refills | Status: DC

## 2017-02-22 NOTE — Telephone Encounter (Signed)
Spoke with pt, aware of rx's.  rx's sent to preferred pharmacy.  Nothing further needed.

## 2017-02-22 NOTE — Telephone Encounter (Signed)
Patient was in [employer] office for a research visit and requested that a message be sent to Dr. [name] requesting a prescription for Doxycycline and Methylprednisolone taper to have to take with her on an upcoming cruise in case she needs it. Please advise. Thanks!  [place] Bing, [place], Research Assistant

## 2017-02-22 NOTE — Telephone Encounter (Signed)
Triage  Please send [name] = Take doxycycline 100mg  po twice daily x 5 days; take after meals and avoid sunlight And Please take prednisone 40 mg x1 day, then 30 mg x1 day, then 20 mg x1 day, then 10 mg x1 day, and then 5 mg x1 day and stop  For upcoming cruise  Dr. Brand Males, [name], [facility].C.P Pulmonary and Critical Care Medicine Staff Physician [place] [facility] Pager: [phone], If no answer or between  15:00h - 7:00h: call [phone]  02/22/2017 3:50 PM

## 2017-02-23 ENCOUNTER — Encounter (HOSPITAL_COMMUNITY)
Admission: RE | Admit: 2017-02-23 | Discharge: 2017-02-23 | Disposition: A | Discharge status: Home / Self Care | Payer: Self-pay | Source: Ambulatory Visit | Attending: Internal Medicine | Admitting: Internal Medicine

## 2017-02-24 ENCOUNTER — Ambulatory Visit (INDEPENDENT_AMBULATORY_CARE_PROVIDER_SITE_OTHER): Payer: Medicare Other | Admitting: Internal Medicine

## 2017-02-24 ENCOUNTER — Telehealth: Payer: Self-pay | Admitting: Internal Medicine

## 2017-02-24 ENCOUNTER — Encounter: Payer: Self-pay | Admitting: Internal Medicine

## 2017-02-24 VITALS — BP 118/71 | HR 61 | Temp 98.1°F | Resp 12 | Wt 217.0 lb

## 2017-02-24 DIAGNOSIS — Z006 Encounter for examination for normal comparison and control in clinical research program: Secondary | ICD-10-CM

## 2017-02-24 DIAGNOSIS — J449 Chronic obstructive pulmonary disease, unspecified: Secondary | ICD-10-CM

## 2017-02-24 MED ORDER — ALBUTEROL SULFATE HFA 108 (90 BASE) MCG/ACT IN AERS: 2.0000 | INHALATION_SPRAY | Freq: Four times a day (QID) | RESPIRATORY_TRACT | 2 refills | Status: DC | PRN

## 2017-02-24 NOTE — Patient Instructions (Addendum)
ICD-9-CM ICD-10-CM   1. Research study patient V70.7 Z00.6   2. Moderate COPD (chronic obstructive pulmonary disease) ([place]) 496 J44.9    Return for end of study vsiti

## 2017-02-24 NOTE — Progress Notes (Signed)
Title: Randomised, Double-Blind (Sponsor Open), Placebo-Controlled, Multicentre, Dose Ranging Study to Evaluate the Efficacy and Safety of Danirixin Tablets Administered Twice Daily Compared With Placebo for 24 Weeks in Adults With COPD  Sponsor: [employer] , Protoocol Number: [id number], NCT Trial #: [id number]  Synopsis:  Following baseline assessments collected over a 7 day period participants will be randomized (1:1:1:1:1:1) to receive one of five dose strengths of danirixin (5 milligram [mg], 10 mg, 25 mg, 35 mg and 50 mg) or placebo. Study treatment will be administered orally twice daily for 24 weeks. Participants will continue with their standard of care inhaled medications during study. Follow up will continue up to 28 days post last dose  Key Inclusion Criteria: age 109-80, COPD with fev1 >/= 40%, ratio <0.7, >/= 2 copd exacerbations in past 1 year treated with either antibiotics or steroids, but >/= 14 days since last antibiotic or steroid for exacerbation, >/= 10 pack smoking history  Key Exclusion Criteria: non-COPD lung disease, pulse ox < 88% at rest on room air (i.e, restting o2 patients excluded), active phase of pulmonary rehabilitation (maintenance ok), previous lung surgery, QTc prolongation, HIV positive etc..   Key end points: Changes in respiratory symptoms and copd exacerbations and safety of DNX  Key features of DNX:   selective CXC chemokine receptor (CXCR2) antagonist . Positive trends in COPD exacerbation, symptoms and health status  Safety of DNX: As of April 2017 (5 completed Phase 1 studies, and 1 Phase 2A study of moderate copd patients with 45 patients):Marland Kitchen Most side effects are seen at 100mg  bid (current study max is 50mg  bid)  Phase 1 Normal Adults - dose </= 50mg  bid Study drug -    placebo  General All side effects mild - moderate. No changes in labs, EKG and vitals including PMN per PI meeting slide  x  Any adverse event 30% 13%  Headache 10-20% 0-17%   Fatigue/Lethargy 0-10% 0-3%  Abd Pain 0-11% (20% at 200mg  bid) 0%   Cough 0% 0%  Study withdrawal 0% 0%  SAE 0% 0%   - 3. COPD  COPD patients -data from IB April 2017 and PI meeting Spring 2017 Study drug Placebo  Efficacy  AECOPD and CAT scores better with drug after 1 year x  General No change in PMN, EKG, Vitals, Labs, Spirometry but produces mild to moderate headache and diarrhea  x  Any AE 52-77% 55-80%  Cystitis 5% at 75mg  bid 3%  Backpain 5% at 75mg  bid 0%  Nasopharyngitis 29% at 75mg  bid 33%  Headache 9% at 75mg  bid 8%  Upper abdominal pain 0% at 75mg  bid 6%  Infection rate 42% 50%  SAE 10 events 31 events   ........................................................................................................................................................................ Clinical Research Coordinator / Research RN note : This visit for [name] with DOB: 09-14-1946 on 02/24/2017 for the above protocol is Visit/Encounter # 10 and is for purpose ofresearch. The consent for this encounter is under Protocol Version Amendment 1and  is currently IRB approved. Subject expressed continued interest and consent in continuing as a study subject. Subject confirmed that there was no  change in contact information (e.g. address, telephone, email). Subject thanked for participation in research and contribution to science.   All procedures completed per above mentioned protocol. E-diary and Propeller MDI sensor was collected from the subject. Study medication was also collected on this visit. Tablet count verified by this coordinator and PI Dr. [name].   AE- Rhinorrhea, ongoing, not resolved.  AE-UTI, resolved, end date 27/Apr/2018 New  reported AE- Laceration on right distal forearm. Subject is treating laceration with triple antibiotic ointment. Start date 08/May/2018. New reported AE- Weight gain. Start date documented as 01/Jan/2018, per subject weight gain first noticed  after christmas/new year holiday.  Refer to the subjects paper source binder for additional documentation.    In this visit 02/24/2017 the subject will be evaluated by investigator named Dr. [name] . This research coordinator has verified that the investigator is uptodate with his/her training logs. Refer to visit progress note from Dr. [name] for documentation of physical performed.   Signed by,  [place] Bing, CMA, [place] Coordinator [facility]  [place], [place] 12:28 PM 02/24/2017

## 2017-02-24 NOTE — Telephone Encounter (Signed)
Patient is requesting a refill on albuterol inhaler. Refill sent, patient is aware. [place] Bing, CMA

## 2017-02-24 NOTE — Progress Notes (Signed)
0Subjective:     Patient ID: [name], female   DOB: 06-Jul-1946, 71 y.o.   MRN: [id number]  HPI  Title: Randomised, Double-Blind (Sponsor Open), Placebo-Controlled, Multicentre, Dose Ranging Study to Evaluate the Efficacy and Safety of Danirixin Tablets Administered Twice Daily Compared With Placebo for 24 Weeks in Adults With COPD  Sponsor: [employer] , Protoocol Number: [id number], NCT Trial #: [id number]  Synopsis:  Following baseline assessments collected over a 7 day period participants will be randomized (1:1:1:1:1:1) to receive one of five dose strengths of danirixin (5 milligram [mg], 10 mg, 25 mg, 35 mg and 50 mg) or placebo. Study treatment will be administered orally twice daily for 24 weeks. Participants will continue with their standard of care inhaled medications during study. Follow up will continue up to 28 days post last dose  Key Inclusion Criteria: age 34-80, COPD with fev1 >/= 40%, ratio <0.7, >/= 2 copd exacerbations in past 1 year treated with either antibiotics or steroids, but >/= 14 days since last antibiotic or steroid for exacerbation, >/= 10 pack smoking history  Key Exclusion Criteria: non-COPD lung disease, pulse ox < 88% at rest on room air (i.e, restting o2 patients excluded), active phase of pulmonary rehabilitation (maintenance ok), previous lung surgery, QTc prolongation, HIV positive etc..   Key end points: Changes in respiratory symptoms and copd exacerbations and safety of DNX  Key features of DNX:   selective CXC chemokine receptor (CXCR2) antagonist . Positive trends in COPD exacerbation, symptoms and health status  Safety of DNX: As of April 2017 (5 completed Phase 1 studies, and 1 Phase 2A study of moderate copd patients with 45 patients):Marland Kitchen Most side effects are seen at 100mg  bid (current study max is 50mg  bid)  Phase 1 Normal Adults - dose </= 50mg  bid Study drug -    placebo  General All side effects mild - moderate. No changes in labs,  EKG and vitals including PMN per PI meeting slide  x  Any adverse event 30% 13%  Headache 10-20% 0-17%  Fatigue/Lethargy 0-10% 0-3%  Abd Pain 0-11% (20% at 200mg  bid) 0%   Cough 0% 0%  Study withdrawal 0% 0%  SAE 0% 0%   - 3. COPD  COPD patients -data from IB April 2017 and PI meeting Spring 2017 Study drug Placebo  Efficacy  AECOPD and CAT scores better with drug after 1 year x  General No change in PMN, EKG, Vitals, Labs, Spirometry but produces mild to moderate headache and diarrhea  x  Any AE 52-77% 55-80%  Cystitis 5% at 75mg  bid 3%  Backpain 5% at 75mg  bid 0%  Nasopharyngitis 29% at 75mg  bid 33%  Headache 9% at 75mg  bid 8%  Upper abdominal pain 0% at 75mg  bid 6%  Infection rate 42% 50%  SAE 10 events 31 events   ........................................................................................................................................................................   S: Pi visit 02/24/2017   D10 - day 168 - end of Rx visit. Took last dose last night of blinded IP approx 6pm yesterday  02/23/17. From COPD standpoint feels great. No interima AECOPD. Discussed with medical monitor Dr Norvel Richards and got email from them confirming that since [place] is not in the PK study, no need to dose 02/24/2017 for last dose.   Has NEW AE   - one week ago moving boxes and laceration, mild, minor in distal right forearm. Slowly healing with cream of antibiotic. 1cm x 2 lacrations. Otherwise well   - 7# weight gain since start of study -  new report from subject 02/24/2017 - no change in diet or exercise. Wonders if blinded IP related  For this visit: Questionnairs, lab, EKG, and spirometry, collection of IP and devices  -Pill count done by PI: 19 remainning     Review of Systems     Objective:   Physical Exam  There were no vitals filed for this visit.  EXAM- see source doc. Has lacerations  EKG - no change from baseline. NCS  Arlyce Harman   -r eviewed in source doc.  Fev1 1.3L/53% -> 1.42L     Assessment:       ICD-9-CM ICD-10-CM   1. Research study patient V70.7 Z00.6   2. Moderate COPD (chronic obstructive pulmonary disease) ([facility]) 496 J44.9    COPD stable Research - end of Rx visit;  AE  - laceration, mild, new - unrelated to IP. Improving  - weight gain - mild, new - possibly related to IP     Plan:     Return for End of study visit      Dr. Brand Males, [name], [facility].C.P Pulmonary and Critical Care Medicine Staff Physician [place] [facility] Pager: [phone], If no answer or between  15:00h - 7:00h: call [phone]  02/24/2017 9:14 AM

## 2017-02-28 ENCOUNTER — Encounter (HOSPITAL_COMMUNITY)
Admission: RE | Admit: 2017-02-28 | Discharge: 2017-02-28 | Disposition: A | Discharge status: Home / Self Care | Payer: Self-pay | Source: Ambulatory Visit | Attending: Internal Medicine | Admitting: Internal Medicine

## 2017-03-02 ENCOUNTER — Encounter (HOSPITAL_COMMUNITY): Payer: Self-pay

## 2017-03-07 ENCOUNTER — Encounter (HOSPITAL_COMMUNITY): Payer: Self-pay

## 2017-03-09 ENCOUNTER — Encounter (HOSPITAL_COMMUNITY): Payer: Self-pay

## 2017-03-14 ENCOUNTER — Encounter (HOSPITAL_COMMUNITY): Payer: Self-pay | Attending: Internal Medicine

## 2017-03-14 DIAGNOSIS — R0600 Dyspnea, unspecified: Secondary | ICD-10-CM | POA: Insufficient documentation

## 2017-03-14 DIAGNOSIS — Z954 Presence of other heart-valve replacement: Secondary | ICD-10-CM | POA: Insufficient documentation

## 2017-03-15 NOTE — Progress Notes (Signed)
[name] has decided to drop out of the Pulmonary Maintenance Program for the month of June due to  things that she has going on and she will not be able to attend. She plans to re-enroll in July.

## 2017-03-16 ENCOUNTER — Encounter (HOSPITAL_COMMUNITY): Admission: RE | Admit: 2017-03-16 | Payer: Self-pay | Source: Ambulatory Visit

## 2017-03-21 ENCOUNTER — Encounter (HOSPITAL_COMMUNITY): Payer: Self-pay

## 2017-03-23 ENCOUNTER — Encounter (HOSPITAL_COMMUNITY): Payer: Self-pay

## 2017-03-24 ENCOUNTER — Ambulatory Visit (INDEPENDENT_AMBULATORY_CARE_PROVIDER_SITE_OTHER): Payer: Medicare Other | Admitting: Adult Health

## 2017-03-24 ENCOUNTER — Encounter: Payer: Self-pay | Admitting: Adult Health

## 2017-03-24 DIAGNOSIS — Z006 Encounter for examination for normal comparison and control in clinical research program: Secondary | ICD-10-CM

## 2017-03-24 DIAGNOSIS — J449 Chronic obstructive pulmonary disease, unspecified: Secondary | ICD-10-CM

## 2017-03-24 NOTE — Assessment & Plan Note (Signed)
  Title: Randomised, Double-Blind (Sponsor Open), Placebo-Controlled, Multicentre, Dose Ranging Study to Evaluate the Efficacy and Safety of Danirixin Tablets Administered Twice Daily Compared With Placebo for 24 Weeks in Adults With COPD  EOS visit - appears to tolerated well without sx or complaints  Cont per protocol .

## 2017-03-24 NOTE — Progress Notes (Signed)
Title: Randomised, Double-Blind (Sponsor Open), Placebo-Controlled, Multicentre, Dose Ranging Study to Evaluate the Efficacy and Safety of Danirixin Tablets Administered Twice Daily Compared With Placebo for 24 Weeks in Adults With COPD  Sponsor: [employer] , Protoocol Number: [id number], NCT Trial #: [id number]  Synopsis:  Following baseline assessments collected over a 7 day period participants will be randomized (1:1:1:1:1:1) to receive one of five dose strengths of danirixin (5 milligram [mg], 10 mg, 25 mg, 35 mg and 50 mg) or placebo. Study treatment will be administered orally twice daily for 24 weeks. Participants will continue with their standard of care inhaled medications during study. Follow up will continue up to 28 days post last dose  Key Inclusion Criteria: age 77-80, COPD with fev1 >/= 40%, ratio <0.7, >/= 2 copd exacerbations in past 1 year treated with either antibiotics or steroids, but >/= 14 days since last antibiotic or steroid for exacerbation, >/= 10 pack smoking history  Key Exclusion Criteria: non-COPD lung disease, pulse ox < 88% at rest on room air (i.e, restting o2 patients excluded), active phase of pulmonary rehabilitation (maintenance ok), previous lung surgery, QTc prolongation, HIV positive etc..   Key end points: Changes in respiratory symptoms and copd exacerbations and safety of DNX  Key features of DNX:   selective CXC chemokine receptor (CXCR2) antagonist . Positive trends in COPD exacerbation, symptoms and health status  Safety of DNX: As of April 2017 (5 completed Phase 1 studies, and 1 Phase 2A study of moderate copd patients with 45 patients):Marland Kitchen Most side effects are seen at 100mg  bid (current study max is 50mg  bid)  Phase 1 Normal Adults - dose </= 50mg  bid Study drug -    placebo  General All side effects mild - moderate. No changes in labs, EKG and vitals including PMN per PI meeting slide  x  Any adverse event 30% 13%  Headache 10-20% 0-17%   Fatigue/Lethargy 0-10% 0-3%  Abd Pain 0-11% (20% at 200mg  bid) 0%   Cough 0% 0%  Study withdrawal 0% 0%  SAE 0% 0%   - 3. COPD  COPD patients -data from IB April 2017 and PI meeting Spring 2017 Study drug Placebo  Efficacy  AECOPD and CAT scores better with drug after 1 year x  General No change in PMN, EKG, Vitals, Labs, Spirometry but produces mild to moderate headache and diarrhea  x  Any AE 52-77% 55-80%  Cystitis 5% at 75mg  bid 3%  Backpain 5% at 75mg  bid 0%  Nasopharyngitis 29% at 75mg  bid 33%  Headache 9% at 75mg  bid 8%  Upper abdominal pain 0% at 75mg  bid 6%  Infection rate 42% 50%  SAE 10 events 31 events   ........................................................................................................................................................................   Clinical Research Coordinator / Research RN note : This visit for [name] with DOB: 10/28/1945 on 03/24/2017 for the above protocol is Visit/Encounter # EOS Follow-up and is for purpose of research . The consent for this encounter is under Protocol Version Amendment 1 and  is currently IRB approved. Subject expressed continued interest and consent in continuing as a study subject. Subject confirmed that there was  no change in contact information (e.g. address, telephone, email). Subject thanked for participation in research and contribution to science.   In this visit 03/24/2017 the subject will be evaluated by investigator named Rexene Edison, NP . This research coordinator has verified that the investigator is uptodate with his/her training logs.  Subject stated that on May 27th at 7pm she suddenly began to  have diarrhea, vomiting, and chills. She stated she continued to experience these symptoms through early morning on May 28th, with the last symptom being around 3am. Subject states she tried to take a phenergan tablet without relief. Subject states she has not experienced these symptoms  again since this event.   Weight gain: Per subject she has noticed that her weight gain has slowed since last IP dose and she has begun to notice her weight decreasing. At today's visit the subject weighed 216 lbs. At the start of the study subjects weight was 210lbs, and at EOT visit 10 subjects weight was 217lbs. This AE is considered resolving as of 15/Jun/2018.  AE of rhinorrhea resolving 15/Jun/18: Subject states she has noticed an improvement in this but does not consider it back to baseline yet.   Laceration of right forearm: Resolved as of 22/May/18. Laceration is well healed and subject no longer has the area covered with bandages.  All procedures completed per the above protocol. Refer to subjects paper source binder for further documentation.    Signed by  [place] Bing, [place], [place] Coordinator  [facility]  [place], [place] 4:05 PM 03/24/2017

## 2017-03-24 NOTE — Progress Notes (Signed)
@Patient  ID: [name], female    DOB: [birth year], 71 y.o.   MRN: [id number]  Chief Complaint  Patient presents with  . Research    Follow-up Visit [place]    Referring provider: Ria Bush, MD  HPI: 71 yo followed for Moderate GOLD II COPD prone to frequent COPD exacerbations   Title: Randomised, Double-Blind (Sponsor Open), Placebo-Controlled, Multicentre, Dose Ranging Study to Evaluate the Efficacy and Safety of Danirixin Tablets Administered Twice Daily Compared With Placebo for 24 Weeks in Adults With COPD  Sponsor: [employer] ,Protoocol Number: [id number],QIH Trial #: [id number]  03/24/2017 Research Visit : EOS follow up  Patient presents for a research visit. Patient is here for the end of study follow-up visit.. Say she is doing well with no complaints. Denies flare of cough or wheezing  Denies questions or concerns regarding study .    Allergies  Allergen Reactions  . Codeine Nausea Only  . Prednisone Hives    In different places all over the body.  . Sulfonamide Derivatives Nausea Only    Immunization History  Administered Date(s) Administered  . H1N1 09/17/2008  . Influenza Split 07/18/2012  . Influenza Whole 08/10/2005, 06/24/2009, 07/11/2011  . Influenza, High Dose Seasonal PF 07/04/2016  . Influenza-Unspecified 07/24/2013, 06/30/2014, 06/30/2015  . Pneumococcal Conjugate-13 06/02/2014  . Pneumococcal Polysaccharide-23 08/10/2005, 04/21/2008, 06/25/2013  . Td 10/11/2005  . Zoster 09/16/2014    Past Medical History:  Diagnosis Date  . Acute cystitis without hematuria 12/22/2015  . Anemia   . Arthritis   . CAP (community acquired pneumonia) 02/08/2016  . Cataracts, bilateral    immature  . Chronic diastolic heart failure ([place]) 09/19/2014  . Chronic insomnia   . Constipation    takes Miralax daily as needed  . COPD (chronic obstructive pulmonary disease) (HCC)    Albuterol inhaler daily as needed;Duoneb daily as needed;Spiriva  daily  . DDD (degenerative disc disease)    cervical - kyphosis with mod DD changes C5/6 and C6/7; lumbar - early DD at L2/3 ([place])  . Essential hypertension    was on meds but after appointment Dec 11 with Dr.C he took her off  . GERD (gastroesophageal reflux disease)    takes Protonix daily  . History of radiation therapy 05/30/11 to 07/07/11   rectum  . History of shingles   . Hyperlipemia    takes Lovastatin daily  . Legally blind in left eye, as defined in [place]   . Lung nodule    RLL nodule-88mm stable 2006, April 2009, and June 2009  . Obesity   . Osteopenia 12/2014   T -1.5 hip  . Pancreatitis 11/2014   ?zpack related vs gallstone pancreatitis with abnormal HIDA scan pending cholecystectomy  . Rectal cancer ([place]) 04/2011   T3N0; s/p lap LAR, s/p ileostomy, s/p reversal, s/p chemo  . Rheumatic heart disease mitral stenosis    mod MS by echo 02/2014  . S/P AVR (aortic valve replacement) 2015   bioprosthetic ([place])  . S/P MVR (mitral valve replacement) 2015   bioprosthetic ([place])  . Seizures ([place])    febrile seizures as a child  . Severe aortic valve stenosis  AVR 3/15 10/01/2013   mild-mod by echo 02/2014  . Sleep apnea    PT TOLD BORDERLINE-TRIED CPAP-DID NOT HELP-SHE DOES NOT USE CPAP  . Small bowel obstruction, partial ([place]) 08/2013   reolved without NGT placement.   . Vitamin B12 deficiency 02/28/2015   Start B12 shots 02/2015  Tobacco History: History  Smoking Status  . Former Smoker  . Packs/day: 1.50  . Years: 10.00  . Types: Cigarettes  . Quit date: 12/28/1974  Smokeless Tobacco  . Never Used    Comment: quit smoking in 1976   Counseling given: Not Answered   Outpatient Encounter Prescriptions as of 03/24/2017  Medication Sig  . albuterol (PROVENTIL HFA;VENTOLIN HFA) 108 (90 Base) MCG/ACT inhaler Inhale 2 puffs into the lungs every 6 (six) hours as needed for wheezing.  Marland Kitchen amoxicillin (AMOXIL) 500 MG capsule take 4 capsules by mouth 30-60 MINUTES  PRIOR TO DENTAL APPT  . aspirin 81 MG tablet Take 81 mg by mouth at bedtime.   Marland Kitchen BIOTIN PO Take 1 tablet by mouth at bedtime.   . bisoprolol (ZEBETA) 5 MG tablet take 1/2 tablet by mouth once daily  . BREO ELLIPTA 200-25 MCG/INH AEPB inhale 1 puff INTO THE LUNGS daily  . Calcium Carbonate-Vitamin D (CALCIUM 600 + D PO) Take 600 mg by mouth at bedtime.   . Cholecalciferol (VITAMIN D) 2000 UNITS CAPS Take 2,000 Units by mouth at bedtime.   Marland Kitchen doxycycline (VIBRA-TABS) 100 MG tablet Take 1 tablet (100 mg total) by mouth 2 (two) times daily.  . furosemide (LASIX) 40 MG tablet Take one tablet by mouth daily 5 times a week.  Marland Kitchen ipratropium-albuterol (DUONEB) 0.5-2.5 (3) MG/3ML SOLN Take 3 mLs by nebulization every 6 (six) hours as needed. (Patient taking differently: Take 3 mLs by nebulization every 6 (six) hours as needed (for wheezing or shortness of breath). )  . lovastatin (MEVACOR) 40 MG tablet take 1 tablet by mouth at bedtime  . Miconazole Nitrate 2 % POWD Apply 1 application topically daily as needed (for skin). Apply to stomach and under breasts  . montelukast (SINGULAIR) 10 MG tablet take 1 tablet by mouth at bedtime  . pantoprazole (PROTONIX) 40 MG tablet Take 1 tablet (40 mg total) by mouth every other day.  . polycarbophil (FIBERCON) 625 MG tablet Take 625 mg by mouth daily.  . polyethylene glycol (MIRALAX / GLYCOLAX) packet Take 17 g by mouth daily.  . potassium chloride (K-DUR) 10 MEQ tablet take 2 tablets by mouth once daily (Patient taking differently: take 2 tablets by mouth once daily ( 5 days a week))  . predniSONE (DELTASONE) 10 MG tablet 4 tabs X1 day, 3 tabs X1 day, 2 tabs X1 day, 1 tab X1 day, 0.5 tab X1 day, then stop.  Marland Kitchen research study medication Take by mouth.  . SPIRIVA HANDIHALER 18 MCG inhalation capsule inhale the contents of one capsule in the handihaler once daily  . traMADol (ULTRAM) 50 MG tablet Take 1 tablet (50 mg total) by mouth every 6 (six) hours as needed for  moderate pain.  Marland Kitchen triamcinolone cream (KENALOG) 0.1 % Apply 1 application topically 2 (two) times daily. Apply to AA.  . vitamin B-12 (CYANOCOBALAMIN) 500 MCG tablet Take 500 mcg by mouth daily.   No facility-administered encounter medications on file as of 03/24/2017.      Review of Systems See HPI    Physical Exam  Wt 216 lb (98 kg)   BMI 34.86 kg/m   GEN: A/Ox3; pleasant , NAD, elderly    HEENT:  [place]/AT,  EACs-clear, TMs-wnl, NOSE-clear, THROAT-clear, no lesions, no postnasal drip or exudate noted.   NECK:  Supple w/ fair ROM; no JVD; normal carotid impulses w/o bruits; no thyromegaly or nodules palpated; no lymphadenopathy.    RESP  Decreased BS in bases ,  no accessory muscle use, no dullness to percussion  CARD:  RRR, no m/r/g, no peripheral edema, pulses intact, no cyanosis or clubbing.  GI:   Soft & nt; nml bowel sounds; no organomegaly or masses detected.   Musco: Warm bil, no deformities or joint swelling noted.   Neuro: alert, no focal deficits noted.    Skin: Warm, no lesions or rashes  Lab Results:  CBC  BMET  BNP  Imaging: No results found.   Assessment & Plan:   No problem-specific Assessment & Plan notes found for this encounter.     Rexene Edison, NP 03/24/2017

## 2017-03-24 NOTE — Progress Notes (Signed)
Title: Randomised, Double-Blind (Sponsor Open), Placebo-Controlled, Multicentre, Dose Ranging Study to Evaluate the Efficacy and Safety of Danirixin Tablets Administered Twice Daily Compared With Placebo for 24 Weeks in Adults With COPD  Sponsor: [employer] , Protoocol Number: [id number], NCT Trial #: [id number]  Synopsis:  Following baseline assessments collected over a 7 day period participants will be randomized (1:1:1:1:1:1) to receive one of five dose strengths of danirixin (5 milligram [mg], 10 mg, 25 mg, 35 mg and 50 mg) or placebo. Study treatment will be administered orally twice daily for 24 weeks. Participants will continue with their standard of care inhaled medications during study. Follow up will continue up to 28 days post last dose  Key Inclusion Criteria: age 64-80, COPD with fev1 >/= 40%, ratio <0.7, >/= 2 copd exacerbations in past 1 year treated with either antibiotics or steroids, but >/= 14 days since last antibiotic or steroid for exacerbation, >/= 10 pack smoking history  Key Exclusion Criteria: non-COPD lung disease, pulse ox < 88% at rest on room air (i.e, restting o2 patients excluded), active phase of pulmonary rehabilitation (maintenance ok), previous lung surgery, QTc prolongation, HIV positive etc..   Key end points: Changes in respiratory symptoms and copd exacerbations and safety of DNX  Key features of DNX:   selective CXC chemokine receptor (CXCR2) antagonist . Positive trends in COPD exacerbation, symptoms and health status  Safety of DNX: As of April 2017 (5 completed Phase 1 studies, and 1 Phase 2A study of moderate copd patients with 45 patients):Marland Kitchen Most side effects are seen at 100mg  bid (current study max is 50mg  bid)  Phase 1 Normal Adults - dose </= 50mg  bid Study drug -    placebo  General All side effects mild - moderate. No changes in labs, EKG and vitals including PMN per PI meeting slide  x  Any adverse event 30% 13%  Headache 10-20% 0-17%   Fatigue/Lethargy 0-10% 0-3%  Abd Pain 0-11% (20% at 200mg  bid) 0%   Cough 0% 0%  Study withdrawal 0% 0%  SAE 0% 0%   - 3. COPD  COPD patients -data from IB April 2017 and PI meeting Spring 2017 Study drug Placebo  Efficacy  AECOPD and CAT scores better with drug after 1 year x  General No change in PMN, EKG, Vitals, Labs, Spirometry but produces mild to moderate headache and diarrhea  x  Any AE 52-77% 55-80%  Cystitis 5% at 75mg  bid 3%  Backpain 5% at 75mg  bid 0%  Nasopharyngitis 29% at 75mg  bid 33%  Headache 9% at 75mg  bid 8%  Upper abdominal pain 0% at 75mg  bid 6%  Infection rate 42% 50%  SAE 10 events 31 events   .......................................................................................................................................................................Marland Kitchen

## 2017-03-24 NOTE — Assessment & Plan Note (Signed)
Compensated without flare  follow up with Dr. [name] as planned and As needed

## 2017-03-28 ENCOUNTER — Encounter (HOSPITAL_COMMUNITY): Payer: Self-pay

## 2017-03-30 ENCOUNTER — Encounter (HOSPITAL_COMMUNITY): Payer: Self-pay

## 2017-04-03 ENCOUNTER — Encounter: Payer: Self-pay | Admitting: Family Medicine

## 2017-04-03 MED ORDER — NAPROXEN 375 MG PO TABS: 375.0000 mg | ORAL_TABLET | Freq: Two times a day (BID) | ORAL | 0 refills | Status: DC

## 2017-04-04 ENCOUNTER — Encounter (HOSPITAL_COMMUNITY): Payer: Self-pay

## 2017-04-06 ENCOUNTER — Encounter (HOSPITAL_COMMUNITY): Payer: Self-pay

## 2017-04-11 ENCOUNTER — Encounter (HOSPITAL_COMMUNITY): Payer: Self-pay

## 2017-04-13 ENCOUNTER — Telehealth: Payer: Self-pay | Admitting: Internal Medicine

## 2017-04-13 NOTE — Telephone Encounter (Addendum)
.  Clinical Research Coordinator / Research RN note : This phone call for [name] with DOB: [birth year] on 04/13/2017 for the  Protocol [id number]  and is for purpose of reviewing lab results from Follow-up Visit. This coordinator spoke with the subject and reviewed that her ALT level was elevated at the time of her visit on June 15th. PI recommends subject follow-up with her PCP to have lab levels rechecked. Subject states understanding and will f/u with her PCP. Subject had no questions or concerns at this time.  Per PI Dr. [name] this is an AE-transaminitis, single episode, mild, not related to study drug.   Signed by  [place] Bing, [place] Coordinator  [facility]  [place], [place] 4:07 PM 04/13/2017

## 2017-04-13 NOTE — Telephone Encounter (Signed)
PI OVERSIGHT ATTESTATION  I the Principal Investigator (PI) for the above mentioned study attest that I reviewed the above mentioned  clinical research coordinator notes on research subject  [name]  born [birth year] . I  agree with the findings mentioned above   Dr. Brand Males, [name], F.C.C.P., ACRP-CPI Pulmonary and Critical Care Medicine Principal Investigator & Staff Physician [facility] [place] [facility] [place] [facility] Pager: [phone], If no answer or between  15:00h - 7:00h: call [phone]  04/13/2017 5:05 PM

## 2017-04-17 ENCOUNTER — Encounter: Payer: Self-pay | Admitting: Internal Medicine

## 2017-04-17 NOTE — Telephone Encounter (Signed)
Anderson Malta please advise. Thanks.

## 2017-04-24 ENCOUNTER — Ambulatory Visit (INDEPENDENT_AMBULATORY_CARE_PROVIDER_SITE_OTHER): Payer: Medicare Other | Admitting: *Deleted

## 2017-04-24 ENCOUNTER — Telehealth: Payer: Self-pay | Admitting: Cardiology

## 2017-04-24 DIAGNOSIS — I441 Atrioventricular block, second degree: Secondary | ICD-10-CM | POA: Diagnosis not present

## 2017-04-24 NOTE — Telephone Encounter (Signed)
Spoke with pt and reminded pt of remote transmission that is due today. Pt verbalized understanding.   

## 2017-04-25 NOTE — Progress Notes (Signed)
Remote pacemaker transmission.   

## 2017-04-26 ENCOUNTER — Encounter: Payer: Self-pay | Admitting: Cardiology

## 2017-04-26 LAB — CUP PACEART REMOTE DEVICE CHECK
Battery Remaining Longevity: 115 mo
Battery Remaining Percentage: 95.5 %
Battery Voltage: 3.01 V
Brady Statistic AP VP Percent: 49 %
Brady Statistic AP VS Percent: 2.4 %
Brady Statistic AS VP Percent: 8.3 %
Brady Statistic AS VS Percent: 40 %
Brady Statistic RA Percent Paced: 51 %
Brady Statistic RV Percent Paced: 57 %
Date Time Interrogation Session: 20180716151533
Implantable Lead Implant Date: 20170907
Implantable Lead Implant Date: 20170907
Implantable Lead Location: 753859
Implantable Lead Location: 753860
Implantable Pulse Generator Implant Date: 20170907
Lead Channel Impedance Value: 400 Ohm
Lead Channel Impedance Value: 630 Ohm
Lead Channel Pacing Threshold Amplitude: 0.75 V
Lead Channel Pacing Threshold Amplitude: 0.75 V
Lead Channel Pacing Threshold Pulse Width: 0.5 ms
Lead Channel Pacing Threshold Pulse Width: 0.5 ms
Lead Channel Sensing Intrinsic Amplitude: 12 mV
Lead Channel Sensing Intrinsic Amplitude: 3.6 mV
Lead Channel Setting Pacing Amplitude: 1 V
Lead Channel Setting Pacing Amplitude: 2 V
Lead Channel Setting Pacing Pulse Width: 0.5 ms
Lead Channel Setting Sensing Sensitivity: 2 mV
Pulse Gen Model: 2272
Pulse Gen Serial Number: 7945283

## 2017-04-29 ENCOUNTER — Other Ambulatory Visit: Payer: Self-pay | Admitting: Internal Medicine

## 2017-04-29 ENCOUNTER — Other Ambulatory Visit: Payer: Self-pay | Admitting: Family Medicine

## 2017-05-01 NOTE — Telephone Encounter (Signed)
Routing to [employer] as FYI.

## 2017-05-03 ENCOUNTER — Ambulatory Visit (HOSPITAL_COMMUNITY): Payer: Self-pay | Admitting: *Deleted

## 2017-05-03 ENCOUNTER — Telehealth: Payer: Self-pay | Admitting: Internal Medicine

## 2017-05-03 DIAGNOSIS — J449 Chronic obstructive pulmonary disease, unspecified: Secondary | ICD-10-CM

## 2017-05-03 NOTE — Telephone Encounter (Signed)
Ok  For rehab maintenance - dx copd  Dr. Brand Males, [name], [facility]C.P Pulmonary and Critical Care Medicine Staff Physician [place] [facility] Pager: [phone], If no answer or between  15:00h - 7:00h: call [phone]  05/03/2017 5:14 PM

## 2017-05-03 NOTE — Telephone Encounter (Signed)
lmom tcb x1 to pulmonary rehab

## 2017-05-03 NOTE — Telephone Encounter (Signed)
MR  Please Advise-  Spoke with [name] and she states pt would like to be in the pulmonary rehab maintenance class and an order is needed for this. Please advise if ok to place order

## 2017-05-03 NOTE — Telephone Encounter (Signed)
Order for pulm rehab has been placed. Will call pulm rehab on 05/04/17 to make aware, as their office is currently closed.

## 2017-05-04 NOTE — Telephone Encounter (Signed)
Called and spoke to [place] with pulm rehab. Informed her of the order. [name] verbalized understanding and denied any further questions or concerns at this time.

## 2017-05-05 ENCOUNTER — Telehealth: Payer: Self-pay | Admitting: Internal Medicine

## 2017-05-05 DIAGNOSIS — J449 Chronic obstructive pulmonary disease, unspecified: Secondary | ICD-10-CM

## 2017-05-05 NOTE — Telephone Encounter (Signed)
Order has been corrected. Nothing else needed.

## 2017-05-08 ENCOUNTER — Other Ambulatory Visit: Payer: Self-pay | Admitting: Family Medicine

## 2017-05-08 DIAGNOSIS — I10 Essential (primary) hypertension: Secondary | ICD-10-CM

## 2017-05-08 DIAGNOSIS — E538 Deficiency of other specified B group vitamins: Secondary | ICD-10-CM

## 2017-05-11 ENCOUNTER — Other Ambulatory Visit (INDEPENDENT_AMBULATORY_CARE_PROVIDER_SITE_OTHER): Payer: Medicare Other

## 2017-05-11 ENCOUNTER — Telehealth: Payer: Self-pay | Admitting: Family Medicine

## 2017-05-11 DIAGNOSIS — E538 Deficiency of other specified B group vitamins: Secondary | ICD-10-CM

## 2017-05-11 DIAGNOSIS — I10 Essential (primary) hypertension: Secondary | ICD-10-CM

## 2017-05-11 LAB — MICROALBUMIN / CREATININE URINE RATIO
Creatinine,U: 385.1 mg/dL
Microalb Creat Ratio: 0.8 mg/g (ref 0.0–30.0)
Microalb, Ur: 3 mg/dL — ABNORMAL HIGH (ref 0.0–1.9)

## 2017-05-11 LAB — BASIC METABOLIC PANEL
BUN: 28 mg/dL — ABNORMAL HIGH (ref 6–23)
CO2: 32 mEq/L (ref 19–32)
Calcium: 9.4 mg/dL (ref 8.4–10.5)
Chloride: 100 mEq/L (ref 96–112)
Creatinine, Ser: 1.6 mg/dL — ABNORMAL HIGH (ref 0.40–1.20)
GFR: 33.69 mL/min — ABNORMAL LOW (ref >60.00)
Glucose, Bld: 94 mg/dL (ref 70–99)
Potassium: 4.2 mEq/L (ref 3.5–5.1)
Sodium: 140 mEq/L (ref 135–145)

## 2017-05-11 LAB — VITAMIN B12: Vitamin B-12: 1158 pg/mL — ABNORMAL HIGH (ref 211–911)

## 2017-05-11 NOTE — Telephone Encounter (Signed)
error 

## 2017-05-15 ENCOUNTER — Ambulatory Visit (INDEPENDENT_AMBULATORY_CARE_PROVIDER_SITE_OTHER): Payer: Medicare Other | Admitting: Family Medicine

## 2017-05-15 ENCOUNTER — Encounter: Payer: Self-pay | Admitting: Family Medicine

## 2017-05-15 VITALS — BP 122/62 | HR 84 | Temp 98.0°F | Ht 66.0 in | Wt 213.1 lb

## 2017-05-15 DIAGNOSIS — I5032 Chronic diastolic (congestive) heart failure: Secondary | ICD-10-CM | POA: Diagnosis not present

## 2017-05-15 DIAGNOSIS — J449 Chronic obstructive pulmonary disease, unspecified: Secondary | ICD-10-CM | POA: Diagnosis not present

## 2017-05-15 DIAGNOSIS — N183 Chronic kidney disease, stage 3 unspecified: Secondary | ICD-10-CM

## 2017-05-15 DIAGNOSIS — E538 Deficiency of other specified B group vitamins: Secondary | ICD-10-CM

## 2017-05-15 DIAGNOSIS — K219 Gastro-esophageal reflux disease without esophagitis: Secondary | ICD-10-CM | POA: Diagnosis not present

## 2017-05-15 NOTE — Assessment & Plan Note (Signed)
Seems euvolemic today.  

## 2017-05-15 NOTE — Assessment & Plan Note (Signed)
Deteriorated in the last several months. Reviewed with patient, encouraged avoid NSAIDs and increase water intake.

## 2017-05-15 NOTE — Patient Instructions (Addendum)
You are doing well today. Return in 6 months for physical/wellness visit and 3 weeks for labwork only.  Increase water, back off aleve and naprosyn.

## 2017-05-15 NOTE — Assessment & Plan Note (Signed)
Followed by pulm. Has completed investigational study. On breo.

## 2017-05-15 NOTE — Assessment & Plan Note (Signed)
Maintaining on oral b12 533mcg daily.

## 2017-05-15 NOTE — Assessment & Plan Note (Signed)
Working on lowest dosing frequency for protonix.

## 2017-05-15 NOTE — Progress Notes (Signed)
BP 122/62 (BP Location: Left Arm, Patient Position: Sitting, Cuff Size: Large)   Pulse 84   Temp 98 F (36.7 C) (Oral)   Ht 5\' 6"  (1.676 m)   Wt 213 lb 1.9 oz (96.7 kg)   SpO2 96%   BMI 34.40 kg/m    CC: 6 mo f/u visit Subjective:    Patient ID: [name], female    DOB: August 06, 1946, 71 y.o.   MRN: [id number]  HPI: [name] is a 71 y.o. female presenting on 05/15/2017 for Follow-up (Pt states she has no complaints today )   Struggles with day to day diet - doesn't like preparing food because she's only cooking for one.   COPD - completed investigational study through Dr Lynford Citizen. ALT was slightly elevated.   GERD - protonix QOD caused some indigestion. She increased again.   Recent trip to [place] in June - traveled with grand daughter. Significant ibuprofen use during that week. Now back to rare ibuprofen use.   Relevant past medical, surgical, family and social history reviewed and updated as indicated. Interim medical history since our last visit reviewed. Allergies and medications reviewed and updated. Outpatient Medications Prior to Visit  Medication Sig Dispense Refill  . albuterol (PROVENTIL HFA;VENTOLIN HFA) 108 (90 Base) MCG/ACT inhaler Inhale 2 puffs into the lungs every 6 (six) hours as needed for wheezing. 1 Inhaler 2  . amoxicillin (AMOXIL) 500 MG capsule take 4 capsules by mouth 30-60 MINUTES PRIOR TO DENTAL APPT 4 capsule 12  . aspirin 81 MG tablet Take 81 mg by mouth at bedtime.     Marland Kitchen BIOTIN PO Take 1 tablet by mouth at bedtime.     . bisoprolol (ZEBETA) 5 MG tablet take 1/2 tablet by mouth once daily 15 tablet 11  . BREO ELLIPTA 200-25 MCG/INH AEPB inhale 1 puff by mouth once daily 60 each 0  . Calcium Carbonate-Vitamin D (CALCIUM 600 + D PO) Take 600 mg by mouth at bedtime.     . Cholecalciferol (VITAMIN D) 2000 UNITS CAPS Take 2,000 Units by mouth at bedtime.     . furosemide (LASIX) 40 MG tablet Take one tablet by mouth daily 5 times a week.  25 tablet 11  . ipratropium-albuterol (DUONEB) 0.5-2.5 (3) MG/3ML SOLN Take 3 mLs by nebulization every 6 (six) hours as needed. (Patient taking differently: Take 3 mLs by nebulization every 6 (six) hours as needed (for wheezing or shortness of breath). ) 360 mL 0  . lovastatin (MEVACOR) 40 MG tablet take 1 tablet by mouth at bedtime 30 tablet 4  . Miconazole Nitrate 2 % POWD Apply 1 application topically daily as needed (for skin). Apply to stomach and under breasts    . montelukast (SINGULAIR) 10 MG tablet take 1 tablet by mouth at bedtime 30 tablet 5  . pantoprazole (PROTONIX) 40 MG tablet Take 1 tablet (40 mg total) by mouth every other day.    . polycarbophil (FIBERCON) 625 MG tablet Take 625 mg by mouth daily.    . polyethylene glycol (MIRALAX / GLYCOLAX) packet Take 17 g by mouth daily. 14 each 0  . potassium chloride (K-DUR) 10 MEQ tablet take 2 tablets by mouth once daily (Patient taking differently: take 2 tablets by mouth once daily ( 5 days a week)) 60 tablet 11  . SPIRIVA HANDIHALER 18 MCG inhalation capsule inhale the contents of one capsule in the handihaler once daily 30 capsule 5  . traMADol (ULTRAM) 50 MG tablet Take  1 tablet (50 mg total) by mouth every 6 (six) hours as needed for moderate pain. 30 tablet 0  . triamcinolone cream (KENALOG) 0.1 % Apply 1 application topically 2 (two) times daily. Apply to AA. 45 g 0  . vitamin B-12 (CYANOCOBALAMIN) 500 MCG tablet Take 500 mcg by mouth daily.    . naproxen (NAPROSYN) 375 MG tablet Take 1 tablet (375 mg total) by mouth 2 (two) times daily with a meal. 30 tablet 0  . research study medication Take by mouth.    . doxycycline (VIBRA-TABS) 100 MG tablet Take 1 tablet (100 mg total) by mouth 2 (two) times daily. (Patient not taking: Reported on 05/15/2017) 10 tablet 0  . predniSONE (DELTASONE) 10 MG tablet 4 tabs X1 day, 3 tabs X1 day, 2 tabs X1 day, 1 tab X1 day, 0.5 tab X1 day, then stop. (Patient not taking: Reported on 05/15/2017) 11  tablet 0   No facility-administered medications prior to visit.      Per HPI unless specifically indicated in ROS section below Review of Systems     Objective:    BP 122/62 (BP Location: Left Arm, Patient Position: Sitting, Cuff Size: Large)   Pulse 84   Temp 98 F (36.7 C) (Oral)   Ht 5\' 6"  (1.676 m)   Wt 213 lb 1.9 oz (96.7 kg)   SpO2 96%   BMI 34.40 kg/m   Wt Readings from Last 3 Encounters:  05/15/17 213 lb 1.9 oz (96.7 kg)  03/24/17 216 lb (98 kg)  02/24/17 217 lb (98.4 kg)    Physical Exam  Constitutional: She appears well-developed and well-nourished. No distress.  HENT:  Mouth/Throat: Oropharynx is clear and moist. No oropharyngeal exudate.  Cardiovascular: Normal rate, regular rhythm and intact distal pulses.   Murmur (s/p AVR) heard. Pulmonary/Chest: Effort normal and breath sounds normal. No respiratory distress. She has no wheezes. She has no rales.  Musculoskeletal: She exhibits no edema.  Nursing note and vitals reviewed.  Results for orders placed or performed in visit on 05/11/17  Vitamin B12  Result Value Ref Range   Vitamin B-12 1,158 (H) 211 - 911 pg/mL  Basic metabolic panel  Result Value Ref Range   Sodium 140 135 - 145 mEq/L   Potassium 4.2 3.5 - 5.1 mEq/L   Chloride 100 96 - 112 mEq/L   CO2 32 19 - 32 mEq/L   Glucose, Bld 94 70 - 99 mg/dL   BUN 28 (H) 6 - 23 mg/dL   Creatinine, Ser 1.60 (H) 0.40 - 1.20 mg/dL   Calcium 9.4 8.4 - 10.5 mg/dL   GFR 33.69 (L) >60.00 mL/min  Microalbumin / creatinine urine ratio  Result Value Ref Range   Microalb, Ur 3.0 (H) 0.0 - 1.9 mg/dL   Creatinine,U 385.1 mg/dL   Microalb Creat Ratio 0.8 0.0 - 30.0 mg/g   *Note: Due to a large number of results and/or encounters for the requested time period, some results have not been displayed. A complete set of results can be found in Results Review.      Assessment & Plan:   Problem List Items Addressed This Visit    Chronic diastolic heart failure ([place])     Seems euvolemic today.       CKD (chronic kidney disease) stage 3, GFR 30-59 ml/min - Primary    Deteriorated in the last several months. Reviewed with patient, encouraged avoid NSAIDs and increase water intake.       Relevant Orders  Renal function panel   COPD (chronic obstructive pulmonary disease) (HCC)    Followed by pulm. Has completed investigational study. On breo.       GERD (gastroesophageal reflux disease)    Working on lowest dosing frequency for protonix.       Vitamin B12 deficiency    Maintaining on oral b12 577mcg daily.          Follow up plan: Return in about 6 months (around 11/15/2017), or if symptoms worsen or fail to improve, for annual exam, prior fasting for blood work, medicare wellness visit.  Ria Bush, MD

## 2017-05-16 ENCOUNTER — Ambulatory Visit (HOSPITAL_COMMUNITY): Payer: Medicare Other

## 2017-05-17 ENCOUNTER — Encounter: Payer: Self-pay | Admitting: Cardiology

## 2017-05-18 ENCOUNTER — Encounter (HOSPITAL_COMMUNITY)
Admission: RE | Admit: 2017-05-18 | Discharge: 2017-05-18 | Disposition: A | Discharge status: Home / Self Care | Payer: Medicare Other | Source: Ambulatory Visit | Attending: Internal Medicine | Admitting: Internal Medicine

## 2017-05-18 DIAGNOSIS — Z954 Presence of other heart-valve replacement: Secondary | ICD-10-CM | POA: Insufficient documentation

## 2017-05-18 DIAGNOSIS — R0600 Dyspnea, unspecified: Secondary | ICD-10-CM | POA: Insufficient documentation

## 2017-05-18 NOTE — Progress Notes (Signed)
Liz restarted the Pulmonary Maintenance program today. She tolerated exercise well without complaints.

## 2017-05-23 ENCOUNTER — Encounter (HOSPITAL_COMMUNITY)
Admission: RE | Admit: 2017-05-23 | Discharge: 2017-05-23 | Disposition: A | Discharge status: Home / Self Care | Payer: Self-pay | Source: Ambulatory Visit | Attending: Internal Medicine | Admitting: Internal Medicine

## 2017-05-25 ENCOUNTER — Encounter (HOSPITAL_COMMUNITY)
Admission: RE | Admit: 2017-05-25 | Discharge: 2017-05-25 | Disposition: A | Discharge status: Home / Self Care | Payer: Self-pay | Source: Ambulatory Visit | Attending: Internal Medicine | Admitting: Internal Medicine

## 2017-05-30 ENCOUNTER — Encounter (HOSPITAL_COMMUNITY): Payer: Medicare Other

## 2017-06-01 ENCOUNTER — Encounter (HOSPITAL_COMMUNITY): Payer: Medicare Other

## 2017-06-03 ENCOUNTER — Other Ambulatory Visit: Payer: Self-pay | Admitting: Cardiovascular Disease

## 2017-06-03 ENCOUNTER — Other Ambulatory Visit: Payer: Self-pay | Admitting: Internal Medicine

## 2017-06-03 ENCOUNTER — Other Ambulatory Visit: Payer: Self-pay | Admitting: Family Medicine

## 2017-06-05 ENCOUNTER — Other Ambulatory Visit (INDEPENDENT_AMBULATORY_CARE_PROVIDER_SITE_OTHER): Payer: Medicare Other

## 2017-06-05 DIAGNOSIS — N183 Chronic kidney disease, stage 3 unspecified: Secondary | ICD-10-CM

## 2017-06-05 LAB — RENAL FUNCTION PANEL
Albumin: 3.9 g/dL (ref 3.5–5.2)
BUN: 16 mg/dL (ref 6–23)
CO2: 34 mEq/L — ABNORMAL HIGH (ref 19–32)
Calcium: 9.9 mg/dL (ref 8.4–10.5)
Chloride: 101 mEq/L (ref 96–112)
Creatinine, Ser: 0.95 mg/dL (ref 0.40–1.20)
GFR: 61.47 mL/min (ref >60.00)
Glucose, Bld: 98 mg/dL (ref 70–99)
Phosphorus: 3.7 mg/dL (ref 2.3–4.6)
Potassium: 4.6 mEq/L (ref 3.5–5.1)
Sodium: 140 mEq/L (ref 135–145)

## 2017-06-05 NOTE — Telephone Encounter (Signed)
REFILL 

## 2017-06-06 ENCOUNTER — Encounter (HOSPITAL_COMMUNITY): Payer: Medicare Other

## 2017-06-06 ENCOUNTER — Encounter: Payer: Self-pay | Admitting: Family Medicine

## 2017-06-06 MED ORDER — FLUTICASONE FUROATE-VILANTEROL 200-25 MCG/INH IN AEPB: 1.0000 | INHALATION_SPRAY | Freq: Every day | RESPIRATORY_TRACT | 8 refills | Status: DC

## 2017-06-08 ENCOUNTER — Encounter (HOSPITAL_COMMUNITY): Payer: Medicare Other

## 2017-06-13 ENCOUNTER — Encounter (HOSPITAL_COMMUNITY)
Admission: RE | Admit: 2017-06-13 | Discharge: 2017-06-13 | Disposition: A | Discharge status: Home / Self Care | Payer: Self-pay | Source: Ambulatory Visit | Attending: Internal Medicine | Admitting: Internal Medicine

## 2017-06-13 DIAGNOSIS — Z954 Presence of other heart-valve replacement: Secondary | ICD-10-CM | POA: Insufficient documentation

## 2017-06-13 DIAGNOSIS — R0600 Dyspnea, unspecified: Secondary | ICD-10-CM | POA: Insufficient documentation

## 2017-06-15 ENCOUNTER — Encounter (HOSPITAL_COMMUNITY)
Admission: RE | Admit: 2017-06-15 | Discharge: 2017-06-15 | Disposition: A | Discharge status: Home / Self Care | Payer: Self-pay | Source: Ambulatory Visit | Attending: Internal Medicine | Admitting: Internal Medicine

## 2017-06-20 ENCOUNTER — Encounter (HOSPITAL_COMMUNITY)
Admission: RE | Admit: 2017-06-20 | Discharge: 2017-06-20 | Disposition: A | Discharge status: Home / Self Care | Payer: Self-pay | Source: Ambulatory Visit | Attending: Internal Medicine | Admitting: Internal Medicine

## 2017-06-22 ENCOUNTER — Encounter (HOSPITAL_COMMUNITY)
Admission: RE | Admit: 2017-06-22 | Discharge: 2017-06-22 | Disposition: A | Discharge status: Home / Self Care | Payer: Self-pay | Source: Ambulatory Visit | Attending: Internal Medicine | Admitting: Internal Medicine

## 2017-06-23 ENCOUNTER — Encounter: Payer: Medicare Other | Admitting: Cardiology

## 2017-06-27 ENCOUNTER — Encounter (HOSPITAL_COMMUNITY)
Admission: RE | Admit: 2017-06-27 | Discharge: 2017-06-27 | Disposition: A | Discharge status: Home / Self Care | Payer: Self-pay | Source: Ambulatory Visit | Attending: Internal Medicine | Admitting: Internal Medicine

## 2017-06-28 ENCOUNTER — Encounter: Payer: Self-pay | Admitting: Cardiology

## 2017-06-28 ENCOUNTER — Ambulatory Visit (INDEPENDENT_AMBULATORY_CARE_PROVIDER_SITE_OTHER): Payer: Medicare Other | Admitting: Cardiology

## 2017-06-28 VITALS — BP 110/80 | HR 75 | Ht 67.0 in | Wt 218.0 lb

## 2017-06-28 DIAGNOSIS — Z45018 Encounter for adjustment and management of other part of cardiac pacemaker: Secondary | ICD-10-CM | POA: Diagnosis not present

## 2017-06-28 DIAGNOSIS — I5032 Chronic diastolic (congestive) heart failure: Secondary | ICD-10-CM | POA: Diagnosis not present

## 2017-06-28 DIAGNOSIS — R001 Bradycardia, unspecified: Secondary | ICD-10-CM

## 2017-06-28 DIAGNOSIS — Z952 Presence of prosthetic heart valve: Secondary | ICD-10-CM

## 2017-06-28 LAB — CUP PACEART INCLINIC DEVICE CHECK
Battery Remaining Longevity: 116 mo
Battery Voltage: 3.01 V
Brady Statistic RA Percent Paced: 51 %
Brady Statistic RV Percent Paced: 56 %
Date Time Interrogation Session: 20180919113309
Implantable Lead Implant Date: 20170907
Implantable Lead Implant Date: 20170907
Implantable Lead Location: 753859
Implantable Lead Location: 753860
Implantable Pulse Generator Implant Date: 20170907
Lead Channel Impedance Value: 412.5 Ohm
Lead Channel Impedance Value: 700 Ohm
Lead Channel Pacing Threshold Amplitude: 0.5 V
Lead Channel Pacing Threshold Amplitude: 0.5 V
Lead Channel Pacing Threshold Amplitude: 0.75 V
Lead Channel Pacing Threshold Amplitude: 0.75 V
Lead Channel Pacing Threshold Pulse Width: 0.5 ms
Lead Channel Pacing Threshold Pulse Width: 0.5 ms
Lead Channel Pacing Threshold Pulse Width: 0.5 ms
Lead Channel Pacing Threshold Pulse Width: 0.5 ms
Lead Channel Sensing Intrinsic Amplitude: 11.9 mV
Lead Channel Sensing Intrinsic Amplitude: 3.8 mV
Lead Channel Setting Pacing Amplitude: 1 V
Lead Channel Setting Pacing Amplitude: 2 V
Lead Channel Setting Pacing Pulse Width: 0.5 ms
Lead Channel Setting Sensing Sensitivity: 2 mV
Pulse Gen Model: 2272
Pulse Gen Serial Number: 7945283

## 2017-06-28 NOTE — Patient Instructions (Addendum)
Medication Instructions:  Your physician recommends that you continue on your current medications as directed. Please refer to the Current Medication list given to you today.  -- If you need a refill on your cardiac medications before your next appointment, please call your pharmacy. --  Labwork: None ordered  Testing/Procedures: None ordered  Follow-Up: Remote monitoring is used to monitor your Pacemaker of ICD from home. This monitoring reduces the number of office visits required to check your device to one time per year. It allows [place] to keep an eye on the functioning of your device to ensure it is working properly. You are scheduled for a device check from home on 07/24/2017. You may send your transmission at any time that day. If you have a wireless device, the transmission will be sent automatically. After your physician reviews your transmission, you will receive a postcard with your next transmission date.  Your physician wants you to follow-up in: 1 year with Dr. Curt Bears.  You will receive a reminder letter in the mail two months in advance. If you don't receive a letter, please call our office to schedule the follow-up appointment.  Thank you for choosing [facility]!!   Trinidad Curet, RN [phone]

## 2017-06-28 NOTE — Progress Notes (Signed)
Electrophysiology Office Note   Date:  06/28/2017   ID:  Anjana, Cheek [birth year], MRN [id number]  PCP:  Ria Bush, MD  Cardiologist:  [place] Primary Electrophysiologist:  Daira Hine Meredith Leeds, MD    Chief Complaint  Patient presents with  . Pacemaker Check    Symtomatic bradycardia  . Shortness of Breath     History of Present Illness: [name] is a 71 y.o. female who presents today for electrophysiology evaluation.   She presented to the hospital in September 2017 with progressive exercise intolerance and shortness of breath and was found to be in 2 to one AV block. She has St. Jude dual-chamber pacemaker implanted at that time. She has been short of breath mildly and VIP was turned on.  Today, denies symptoms of palpitations, chest pain, shortness of breath, orthopnea, PND, lower extremity edema, claudication, dizziness, presyncope, syncope, bleeding, or neurologic sequela. The patient is tolerating medications without difficulties. She is feeling well without complaint. He has not had any further episodes of shortness of breath or palpitations.    Past Medical History:  Diagnosis Date  . Acute cystitis without hematuria 12/22/2015  . Anemia   . Arthritis   . CAP (community acquired pneumonia) 02/08/2016  . Cataracts, bilateral    immature  . Chronic diastolic heart failure ([place]) 09/19/2014  . Chronic insomnia   . Constipation    takes Miralax daily as needed  . COPD (chronic obstructive pulmonary disease) (HCC)    Albuterol inhaler daily as needed;Duoneb daily as needed;Spiriva daily  . DDD (degenerative disc disease)    cervical - kyphosis with mod DD changes C5/6 and C6/7; lumbar - early DD at L2/3 ([facility])  . Essential hypertension    was on meds but after appointment Dec 11 with Dr.C he took her off  . GERD (gastroesophageal reflux disease)    takes Protonix daily  . History of radiation therapy 05/30/11 to 07/07/11   rectum  . History of  shingles   . Hyperlipemia    takes Lovastatin daily  . Legally blind in left eye, as defined in [place]   . Lung nodule    RLL nodule-85mm stable 2006, April 2009, and June 2009  . Obesity   . Osteopenia 12/2014   T -1.5 hip  . Pancreatitis 11/2014   ?zpack related vs gallstone pancreatitis with abnormal HIDA scan pending cholecystectomy  . Rectal cancer ([place]) 04/2011   T3N0; s/p lap LAR, s/p ileostomy, s/p reversal, s/p chemo  . Rheumatic heart disease mitral stenosis    mod MS by echo 02/2014  . S/P AVR (aortic valve replacement) 2015   bioprosthetic ([facility])  . S/P MVR (mitral valve replacement) 2015   bioprosthetic ([facility])  . Seizures ([place])    febrile seizures as a child  . Severe aortic valve stenosis  AVR 3/15 10/01/2013   mild-mod by echo 02/2014  . Sleep apnea    PT TOLD BORDERLINE-TRIED CPAP-DID NOT HELP-SHE DOES NOT USE CPAP  . Small bowel obstruction, partial ([place]) 08/2013   reolved without NGT placement.   . Vitamin B12 deficiency 02/28/2015   Start B12 shots 02/2015    Past Surgical History:  Procedure Laterality Date  . AORTIC VALVE REPLACEMENT N/A 12/12/2013   Procedure: AORTIC VALVE REPLACEMENT (AVR);  Surgeon: Gaye Pollack, MD; Service: Open Heart Surgery  . BACK SURGERY  2006, 2007   2006 SPACER, 2007 decompression and fusion L4/5  . BOWEL RESECTION  04/02/2012   Procedure: SMALL  BOWEL RESECTION;  Surgeon: Stark Klein, MD;  Location: [facility];  Service: General;  Laterality: N/A;  . CARPAL TUNNEL RELEASE Bilateral   . CATARACT EXTRACTION W/ INTRAOCULAR LENS IMPLANT Left 10/18/2016  . CHOLECYSTECTOMY N/A 01/21/2015   chronic cholecystitis, Stark Klein, MD  . COLON RESECTION  08/25/2011   Procedure: COLON RESECTION LAPAROSCOPIC;  Surgeon: Stark Klein, MD;  Location: [facility];  Service: General;  Laterality: N/A;  Laparoscopic Assisted Low Anterior Resection Diverting Ostomy and onQ pain pump  . COLONOSCOPY  03/2013   1 polyp, rpt 3 yrs Ardis Hughs)  . COLONOSCOPY WITH  PROPOFOL N/A 06/09/2016   patent colo-colonic anastomosis, rpt 5 yrs Ardis Hughs)  . EP IMPLANTABLE DEVICE N/A 06/16/2016   Procedure: Pacemaker Implant;  Surgeon: Sheketa Ende Meredith Leeds, MD;  Location: [facility];  Service: Cardiovascular;  Laterality: N/A;  . FOOT SURGERY  left foot   hammer toe  . HAND SURGERY  Bil   carpal tunnel  . ILEOSTOMY  08/25/2011  . ILEOSTOMY CLOSURE  04/02/2012   Procedure: ILEOSTOMY TAKEDOWN;  Surgeon: Stark Klein, MD;  Location: [facility];  Service: General;  Laterality: N/A;  . INTRAOPERATIVE TRANSESOPHAGEAL ECHOCARDIOGRAM N/A 12/12/2013   Procedure: INTRAOPERATIVE TRANSESOPHAGEAL ECHOCARDIOGRAM;  Surgeon: Gaye Pollack, MD;  Location: [facility];  Service: Open Heart Surgery;  Laterality: N/A;  . LEFT AND RIGHT HEART CATHETERIZATION WITH CORONARY ANGIOGRAM N/A 11/27/2013   Procedure: LEFT AND RIGHT HEART CATHETERIZATION WITH CORONARY ANGIOGRAM;  Surgeon: Sanda Klein, MD;  Location: [facility];  Service: Cardiovascular;  Laterality: N/A;  . MITRAL VALVE REPLACEMENT N/A 12/12/2013   Procedure: MITRAL VALVE (MV) REPLACEMENT OR REPAIR;  Surgeon: Gaye Pollack, MD; Service: Open Heart Surgery  . TEE WITHOUT CARDIOVERSION N/A 11/29/2013   Procedure: TRANSESOPHAGEAL ECHOCARDIOGRAM (TEE);  Surgeon: Sueanne Margarita, MD;  Location: [facility];  Service: Cardiovascular;  Laterality: N/A;  . TONSILLECTOMY       Current Outpatient Prescriptions  Medication Sig Dispense Refill  . albuterol (PROVENTIL HFA;VENTOLIN HFA) 108 (90 Base) MCG/ACT inhaler Inhale 2 puffs into the lungs every 6 (six) hours as needed for wheezing. 1 Inhaler 2  . amoxicillin (AMOXIL) 500 MG capsule take 4 capsules by mouth 30-60 MINUTES PRIOR TO DENTAL APPT 4 capsule 12  . aspirin 81 MG tablet Take 81 mg by mouth at bedtime.     Marland Kitchen BIOTIN PO Take 1 tablet by mouth at bedtime.     . bisoprolol (ZEBETA) 5 MG tablet take 1/2 tablet by mouth once daily 15 tablet 5  . Calcium Carbonate-Vitamin D (CALCIUM 600 +  D PO) Take 600 mg by mouth at bedtime.     . Cholecalciferol (VITAMIN D) 2000 UNITS CAPS Take 2,000 Units by mouth at bedtime.     . fluticasone furoate-vilanterol (BREO ELLIPTA) 200-25 MCG/INH AEPB Inhale 1 puff into the lungs daily. 60 each 8  . furosemide (LASIX) 40 MG tablet take 1 tablet by mouth once daily 5 TIMES A WEEK 20 tablet 5  . ipratropium-albuterol (DUONEB) 0.5-2.5 (3) MG/3ML SOLN Take 3 mLs by nebulization every 6 (six) hours as needed. (Patient taking differently: Take 3 mLs by nebulization every 6 (six) hours as needed (for wheezing or shortness of breath). ) 360 mL 0  . lovastatin (MEVACOR) 40 MG tablet take 1 tablet by mouth at bedtime 30 tablet 4  . Miconazole Nitrate 2 % POWD Apply 1 application topically daily as needed (for skin). Apply to stomach and under breasts    .  montelukast (SINGULAIR) 10 MG tablet take 1 tablet by mouth at bedtime 30 tablet 5  . pantoprazole (PROTONIX) 40 MG tablet Take 1 tablet (40 mg total) by mouth every other day.    . polycarbophil (FIBERCON) 625 MG tablet Take 625 mg by mouth daily.    . polyethylene glycol (MIRALAX / GLYCOLAX) packet Take 17 g by mouth daily. 14 each 0  . potassium chloride (K-DUR) 10 MEQ tablet take 2 tablets by mouth once daily 60 tablet 5  . SPIRIVA HANDIHALER 18 MCG inhalation capsule inhale the contents of one capsule in the handihaler once daily 30 capsule 5  . traMADol (ULTRAM) 50 MG tablet Take 1 tablet (50 mg total) by mouth every 6 (six) hours as needed for moderate pain. 30 tablet 0  . vitamin B-12 (CYANOCOBALAMIN) 500 MCG tablet Take 500 mcg by mouth daily.     No current facility-administered medications for this visit.     Allergies:   Codeine; Prednisone; and Sulfonamide derivatives   Social History:  The patient  reports that she quit smoking about 42 years ago. Her smoking use included Cigarettes. She has a 15.00 pack-year smoking history. She has never used smokeless tobacco. She reports that she drinks  alcohol. She reports that she does not use drugs.   Family History:  The patient's family history includes Breast cancer (age of onset: 45) in her maternal aunt; CAD (age of onset: 71) in her son; Diabetes in her mother; Heart attack in her maternal grandfather; Heart attack (age of onset: 63) in her son; Heart disease in her mother; Hypertension in her mother; Lung cancer in her father; Stroke in her mother.    ROS:  Please see the history of present illness.   Otherwise, review of systems is positive for back pain.   All other systems are reviewed and negative.   PHYSICAL EXAM: VS:  BP 110/80   Pulse 75   Ht 5\' 7"  (1.702 m)   Wt 218 lb (98.9 kg)   SpO2 94%   BMI 34.14 kg/m  , BMI Body mass index is 34.14 kg/m. GEN: Well nourished, well developed, in no acute distress  HEENT: normal  Neck: no JVD, carotid bruits, or masses Cardiac: RRR; 2/6 systolic murmur at the base, no rubs, or gallops,no edema  Respiratory:  clear to auscultation bilaterally, normal work of breathing GI: soft, nontender, nondistended, + BS MS: no deformity or atrophy  Skin: warm and dry, device site well healed Neuro:  Strength and sensation are intact Psych: euthymic mood, full affect  EKG:  EKG is not ordered today. Personal review of the ekg ordered 02/24/17 shows AV paced  Personal review of the device interrogation today. Results in [facility]: 11/07/2016: ALT 11; Hemoglobin 12.9; Platelets 174.0; TSH 1.80 06/05/2017: BUN 16; Creatinine, Ser 0.95; Potassium 4.6; Sodium 140    Lipid Panel     Component Value Date/Time   CHOL 186 11/07/2016 1106   TRIG 138.0 11/07/2016 1106   HDL 54.00 11/07/2016 1106   CHOLHDL 3 11/07/2016 1106   VLDL 27.6 11/07/2016 1106   LDLCALC 105 (H) 11/07/2016 1106   LDLDIRECT 111.0 06/26/2015 0928     Wt Readings from Last 3 Encounters:  06/28/17 218 lb (98.9 kg)  05/15/17 213 lb 1.9 oz (96.7 kg)  03/24/17 216 lb (98 kg)      Other studies  Reviewed: Additional studies/ records that were reviewed today include: TTE 06/29/16  Review of the above  records today demonstrates:  - Left ventricle: The cavity size was normal. Wall thickness was   increased in a pattern of mild LVH. Systolic function was normal.   The estimated ejection fraction was in the range of 60% to 65%.   Indeterminant diastolic function. Wall motion was normal; there   were no regional wall motion abnormalities. - Aortic valve: There was a bioprosthetic aortic valve. There did   not appear to be significant regurgitation or stenosis. Mean   gradient (S): 17 mm Hg. - Mitral valve: There was a bioprosthetic mitral valve. There did   not appear to be significant stenosis or regurgitation. The   residual native mitral apparatus is severely calcified. Mean   gradient (D): 6 mm Hg. Valve area by pressure half-time: 1.8   cm^2. - Left atrium: The atrium was mildly dilated. - Right ventricle: The cavity size was normal. Systolic function   was normal. - Tricuspid valve: Peak RV-RA gradient (S): 23 mm Hg. - Pulmonary arteries: PA peak pressure: 26 mm Hg (S). - Inferior vena cava: The vessel was normal in size. The   respirophasic diameter changes were in the normal range (= 50%),   consistent with normal central venous pressure.   ASSESSMENT AND PLAN:  1.  2:1 AV block: This post St. Jude dual-chamber pacemaker. Functioning appropriately. VIP was turned on at her last visit which has greatly improved her symptoms. No changes at this time.   2. Diastolic heart failure: Doing well without shortness of breath. Continue sotalol and Lasix.  3. Rheumatic heart disease: Status post mitral and aortic valve replacement. Valves functioning well on most recent echocardiogram. No physical exam signs that are worrisome.  4. Hyperlipidemia: Continue lovastatin  Current medicines are reviewed at length with the patient today.   The patient does not have concerns regarding  her medicines.  The following changes were made today:  None  Labs/ tests ordered today include:  No orders of the defined types were placed in this encounter.    Disposition:   FU with Jovante Hammitt 12 months  Signed, Naythen Heikkila Meredith Leeds, MD  06/28/2017 11:00 AM     [facility] [address] [place] [address] [phone] (office) [phone] (fax)

## 2017-06-29 ENCOUNTER — Encounter (HOSPITAL_COMMUNITY)
Admission: RE | Admit: 2017-06-29 | Discharge: 2017-06-29 | Disposition: A | Discharge status: Home / Self Care | Payer: Self-pay | Source: Ambulatory Visit | Attending: Internal Medicine | Admitting: Internal Medicine

## 2017-06-30 ENCOUNTER — Telehealth: Payer: Self-pay | Admitting: Internal Medicine

## 2017-06-30 NOTE — Telephone Encounter (Signed)
Anderson Malta can you help pt?

## 2017-07-04 ENCOUNTER — Encounter (HOSPITAL_COMMUNITY)
Admission: RE | Admit: 2017-07-04 | Discharge: 2017-07-04 | Disposition: A | Discharge status: Home / Self Care | Payer: Self-pay | Source: Ambulatory Visit | Attending: Internal Medicine | Admitting: Internal Medicine

## 2017-07-04 NOTE — Telephone Encounter (Signed)
Called and spoke to pt. Pt has completed the research study and is now needing a routine visit with MR. Appt made with MR's first available on 07/31/17. Pt verbalized understanding and denied any further questions or concerns at this time.

## 2017-07-06 ENCOUNTER — Encounter (HOSPITAL_COMMUNITY)
Admission: RE | Admit: 2017-07-06 | Discharge: 2017-07-06 | Disposition: A | Discharge status: Home / Self Care | Payer: Medicare Other | Source: Ambulatory Visit | Attending: Internal Medicine | Admitting: Internal Medicine

## 2017-07-07 ENCOUNTER — Other Ambulatory Visit: Payer: Self-pay | Admitting: Family Medicine

## 2017-07-11 ENCOUNTER — Encounter (HOSPITAL_COMMUNITY): Payer: Medicare Other | Attending: Internal Medicine

## 2017-07-11 DIAGNOSIS — R0600 Dyspnea, unspecified: Secondary | ICD-10-CM | POA: Insufficient documentation

## 2017-07-11 DIAGNOSIS — Z954 Presence of other heart-valve replacement: Secondary | ICD-10-CM | POA: Insufficient documentation

## 2017-07-13 ENCOUNTER — Encounter: Payer: Self-pay | Admitting: Internal Medicine

## 2017-07-13 ENCOUNTER — Ambulatory Visit (INDEPENDENT_AMBULATORY_CARE_PROVIDER_SITE_OTHER): Payer: Medicare Other | Admitting: Internal Medicine

## 2017-07-13 ENCOUNTER — Encounter (HOSPITAL_COMMUNITY): Payer: Medicare Other

## 2017-07-13 VITALS — BP 116/78 | HR 74 | Temp 97.6°F | Wt 218.0 lb

## 2017-07-13 DIAGNOSIS — J441 Chronic obstructive pulmonary disease with (acute) exacerbation: Secondary | ICD-10-CM | POA: Diagnosis not present

## 2017-07-13 MED ORDER — LEVOFLOXACIN 250 MG PO TABS: ORAL_TABLET | ORAL | 0 refills | Status: DC

## 2017-07-13 MED ORDER — METHYLPREDNISOLONE 4 MG PO TBPK: ORAL_TABLET | ORAL | 0 refills | Status: DC

## 2017-07-13 NOTE — Patient Instructions (Signed)
Chronic Obstructive Pulmonary Disease Exacerbation  Chronic obstructive pulmonary disease (COPD) is a common lung problem. In COPD, the flow of air from the lungs is limited. COPD exacerbations are times that breathing gets worse and you need extra treatment. Without treatment they can be life threatening. If they happen often, your lungs can become more damaged. If your COPD gets worse, your doctor may treat you with:  ? Medicines.  ? Oxygen.  ? Different ways to clear your airway, such as using a mask.    Follow these instructions at home:  ? Do not smoke.  ? Avoid tobacco smoke and other things that bother your lungs.  ? If given, take your antibiotic medicine as told. Finish the medicine even if you start to feel better.  ? Only take medicines as told by your doctor.  ? Drink enough fluids to keep your pee (urine) clear or pale yellow (unless your doctor has told you not to).  ? Use a cool mist machine (vaporizer).  ? If you use oxygen or a machine that turns liquid medicine into a mist (nebulizer), continue to use them as told.  ? Keep up with shots (vaccinations) as told by your doctor.  ? Exercise regularly.  ? Eat healthy foods.  ? Keep all doctor visits as told.  Get help right away if:  ? You are very short of breath and it gets worse.  ? You have trouble talking.  ? You have bad chest pain.  ? You have blood in your spit (sputum).  ? You have a fever.  ? You keep throwing up (vomiting).  ? You feel weak, or you pass out (faint).  ? You feel confused.  ? You keep getting worse.  This information is not intended to replace advice given to you by your health care provider. Make sure you discuss any questions you have with your health care provider.  Document Released: 09/15/2011 Document Revised: 03/03/2016 Document Reviewed: 05/31/2013  Elsevier Interactive Patient Education ? 2017 Elsevier Inc.

## 2017-07-13 NOTE — Progress Notes (Signed)
Subjective:    Patient ID: [name], female    DOB: Feb 11, 1946, 71 y.o.   MRN: [id number]  HPI  Pt presents to the clinic today with c/o nasal congestion, sore throat, cough and chest congestion. She reports this started 4 days ago. She is blowing clear mucous out of her nose. She denies difficulty swallowing. The cough is non productive. She denies ear fullness, runny nose or shortness of breath. She denies fever, chills or body aches. She has tried Nyquil with minimal relief. She denies history of seasonal allergies but does have COPD. She had pneumonia last year. She has not had sick contacts that she is aware of.  Review of Systems  Past Medical History:  Diagnosis Date  . Acute cystitis without hematuria 12/22/2015  . Anemia   . Arthritis   . CAP (community acquired pneumonia) 02/08/2016  . Cataracts, bilateral    immature  . Chronic diastolic heart failure ([place]) 09/19/2014  . Chronic insomnia   . Constipation    takes Miralax daily as needed  . COPD (chronic obstructive pulmonary disease) (HCC)    Albuterol inhaler daily as needed;Duoneb daily as needed;Spiriva daily  . DDD (degenerative disc disease)    cervical - kyphosis with mod DD changes C5/6 and C6/7; lumbar - early DD at L2/3 ([place])  . Essential hypertension    was on meds but after appointment Dec 11 with Dr.C he took her off  . GERD (gastroesophageal reflux disease)    takes Protonix daily  . History of radiation therapy 05/30/11 to 07/07/11   rectum  . History of shingles   . Hyperlipemia    takes Lovastatin daily  . Legally blind in left eye, as defined in [place]   . Lung nodule    RLL nodule-75mm stable 2006, April 2009, and June 2009  . Obesity   . Osteopenia 12/2014   T -1.5 hip  . Pancreatitis 11/2014   ?zpack related vs gallstone pancreatitis with abnormal HIDA scan pending cholecystectomy  . Rectal cancer ([place]) 04/2011   T3N0; s/p lap LAR, s/p ileostomy, s/p reversal, s/p chemo  . Rheumatic heart  disease mitral stenosis    mod MS by echo 02/2014  . S/P AVR (aortic valve replacement) 2015   bioprosthetic ([place])  . S/P MVR (mitral valve replacement) 2015   bioprosthetic ([place])  . Seizures ([place])    febrile seizures as a child  . Severe aortic valve stenosis  AVR 3/15 10/01/2013   mild-mod by echo 02/2014  . Sleep apnea    PT TOLD BORDERLINE-TRIED CPAP-DID NOT HELP-SHE DOES NOT USE CPAP  . Small bowel obstruction, partial ([place]) 08/2013   reolved without NGT placement.   . Vitamin B12 deficiency 02/28/2015   Start B12 shots 02/2015     Current Outpatient Prescriptions  Medication Sig Dispense Refill  . albuterol (PROVENTIL HFA;VENTOLIN HFA) 108 (90 Base) MCG/ACT inhaler Inhale 2 puffs into the lungs every 6 (six) hours as needed for wheezing. 1 Inhaler 2  . amoxicillin (AMOXIL) 500 MG capsule take 4 capsules by mouth 30-60 MINUTES PRIOR TO DENTAL APPT 4 capsule 12  . aspirin 81 MG tablet Take 81 mg by mouth at bedtime.     Marland Kitchen BIOTIN PO Take 1 tablet by mouth at bedtime.     . bisoprolol (ZEBETA) 5 MG tablet take 1/2 tablet by mouth once daily 15 tablet 5  . Calcium Carbonate-Vitamin D (CALCIUM 600 + D PO) Take 600 mg by mouth at bedtime.     Marland Kitchen  Cholecalciferol (VITAMIN D) 2000 UNITS CAPS Take 2,000 Units by mouth at bedtime.     . fluticasone furoate-vilanterol (BREO ELLIPTA) 200-25 MCG/INH AEPB Inhale 1 puff into the lungs daily. 60 each 8  . furosemide (LASIX) 40 MG tablet take 1 tablet by mouth once daily 5 TIMES A WEEK 20 tablet 5  . ipratropium-albuterol (DUONEB) 0.5-2.5 (3) MG/3ML SOLN Take 3 mLs by nebulization every 6 (six) hours as needed. (Patient taking differently: Take 3 mLs by nebulization every 6 (six) hours as needed (for wheezing or shortness of breath). ) 360 mL 0  . lovastatin (MEVACOR) 40 MG tablet take 1 tablet by mouth at bedtime 30 tablet 5  . Miconazole Nitrate 2 % POWD Apply 1 application topically daily as needed (for skin). Apply to stomach and under breasts     . montelukast (SINGULAIR) 10 MG tablet take 1 tablet by mouth at bedtime 30 tablet 5  . pantoprazole (PROTONIX) 40 MG tablet Take 1 tablet (40 mg total) by mouth every other day.    . polycarbophil (FIBERCON) 625 MG tablet Take 625 mg by mouth daily.    . polyethylene glycol (MIRALAX / GLYCOLAX) packet Take 17 g by mouth daily. 14 each 0  . potassium chloride (K-DUR) 10 MEQ tablet take 2 tablets by mouth once daily 60 tablet 5  . SPIRIVA HANDIHALER 18 MCG inhalation capsule inhale the contents of one capsule in the handihaler once daily 30 capsule 5  . traMADol (ULTRAM) 50 MG tablet Take 1 tablet (50 mg total) by mouth every 6 (six) hours as needed for moderate pain. 30 tablet 0  . vitamin B-12 (CYANOCOBALAMIN) 500 MCG tablet Take 500 mcg by mouth daily.     No current facility-administered medications for this visit.     Allergies  Allergen Reactions  . Codeine Nausea Only  . Prednisone Hives    In different places all over the body.  . Sulfonamide Derivatives Nausea Only    Family History  Problem Relation Age of Onset  . Lung cancer Father   . Heart disease Mother   . Diabetes Mother   . Hypertension Mother   . Stroke Mother   . CAD Son 27  . Heart attack Son 28  . Heart attack Maternal Grandfather   . Breast cancer Maternal Aunt 83  . Colon cancer Neg Hx   . Esophageal cancer Neg Hx   . Stomach cancer Neg Hx   . Rectal cancer Neg Hx     Social History   Social History  . Marital status: Widowed    Spouse name: N/A  . Number of children: N/A  . Years of education: N/A   Occupational History  . retired    Social History Main Topics  . Smoking status: Former Smoker    Packs/day: 1.50    Years: 10.00    Types: Cigarettes    Quit date: 12/28/1974  . Smokeless tobacco: Never Used     Comment: quit smoking in January 20, 1975  . Alcohol use 0.0 oz/week     Comment: rarely wine  . Drug use: No  . Sexual activity: No   Other Topics Concern  . Not on file   Social  History Narrative   Widow. Husband died 22-Apr-2015 MVA. 1 cat.    2 sons; 1 grandchild. One son died of MI 22   Married Jan 19, 1965   Retired-[employer], volunteers at [employer]   Activity: pulm rehab   Diet: some water, fruits/vegetables daily  Constitutional: Denies fever, malaise, fatigue, headache or abrupt weight changes.  HEENT: Pt reports nasal congestion and sore throat. Denies eye pain, eye redness, ear pain, ringing in the ears, wax buildup, runny nose, bloody nose. Respiratory: Pt reports cough. Denies difficulty breathing, shortness of breath, cough or sputum production.    No other specific complaints in a complete review of systems (except as listed in HPI above).     Objective:   Physical Exam   BP 116/78   Pulse 74   Temp 97.6 F (36.4 C) (Oral)   Wt 218 lb (98.9 kg)   SpO2 95%   BMI 34.14 kg/m  Wt Readings from Last 3 Encounters:  07/13/17 218 lb (98.9 kg)  06/28/17 218 lb (98.9 kg)  05/15/17 213 lb 1.9 oz (96.7 kg)    General: Appears her stated age, in NAD. HEENT: Head: normal shape and size, no sinus tenderness noted; Nose: mucosa pink and moist, septum midline; Throat/Mouth: Teeth present, mucosa pink and moist, + PND, no exudate, lesions or ulcerations noted.  Neck:  No adenopathy noted. Pulmonary/Chest: Normal effort with scattered rhonchi and bilateral expiratory wheezing noted. No respiratory distress.   BMET    Component Value Date/Time   NA 140 06/05/2017 0829   NA 143 07/28/2014 0848   K 4.6 06/05/2017 0829   K 3.6 07/28/2014 0848   CL 101 06/05/2017 0829   CL 104 11/15/2012 0908   CO2 34 (H) 06/05/2017 0829   CO2 30 (H) 07/28/2014 0848   GLUCOSE 98 06/05/2017 0829   GLUCOSE 96 07/28/2014 0848   GLUCOSE 93 11/15/2012 0908   BUN 16 06/05/2017 0829   BUN 19.7 07/28/2014 0848   CREATININE 0.95 06/05/2017 0829   CREATININE 1.2 (H) 07/28/2014 0848   CALCIUM 9.9 06/05/2017 0829   CALCIUM 10.0 07/28/2014 0848   GFRNONAA 53 (L) 06/16/2016 0315   GFRAA  >60 06/16/2016 0315    Lipid Panel     Component Value Date/Time   CHOL 186 11/07/2016 1106   TRIG 138.0 11/07/2016 1106   HDL 54.00 11/07/2016 1106   CHOLHDL 3 11/07/2016 1106   VLDL 27.6 11/07/2016 1106   LDLCALC 105 (H) 11/07/2016 1106    CBC    Component Value Date/Time   WBC 5.5 11/07/2016 1106   RBC 4.42 11/07/2016 1106   HGB 12.9 11/07/2016 1106   HGB 13.7 11/15/2012 0908   HCT 39.7 11/07/2016 1106   HCT 39.4 11/15/2012 0908   PLT 174.0 11/07/2016 1106   PLT 191 11/15/2012 0908   MCV 89.7 11/07/2016 1106   MCV 93.1 11/15/2012 0908   MCH 27.8 06/16/2016 0315   MCHC 32.5 11/07/2016 1106   RDW 15.7 (H) 11/07/2016 1106   RDW 14.4 11/15/2012 0908   LYMPHSABS 0.8 11/07/2016 1106   LYMPHSABS 0.9 11/15/2012 0908   MONOABS 0.7 11/07/2016 1106   MONOABS 0.5 11/15/2012 0908   EOSABS 0.1 11/07/2016 1106   EOSABS 0.2 11/15/2012 0908   BASOSABS 0.1 11/07/2016 1106   BASOSABS 0.1 11/15/2012 0908    Hgb A1C Lab Results  Component Value Date   HGBA1C 5.5 12/06/2013           Assessment & Plan:   COPD Exacerbation:  Advised her to continue daily inhalers She has a nebulizer to use at home eRx for Medrol Dose Pak eRx for Levaquin 250 mg x 7 days Delsym as needed for cough  Return precautions discussed Webb Silversmith, NP

## 2017-07-17 ENCOUNTER — Encounter: Payer: Self-pay | Admitting: Internal Medicine

## 2017-07-18 ENCOUNTER — Encounter (HOSPITAL_COMMUNITY): Payer: Medicare Other

## 2017-07-20 ENCOUNTER — Encounter (HOSPITAL_COMMUNITY): Payer: Medicare Other

## 2017-07-24 ENCOUNTER — Telehealth: Payer: Self-pay | Admitting: Cardiology

## 2017-07-24 ENCOUNTER — Ambulatory Visit (INDEPENDENT_AMBULATORY_CARE_PROVIDER_SITE_OTHER): Payer: Medicare Other | Admitting: *Deleted

## 2017-07-24 DIAGNOSIS — R001 Bradycardia, unspecified: Secondary | ICD-10-CM | POA: Diagnosis not present

## 2017-07-24 NOTE — Telephone Encounter (Signed)
Spoke w/ pt and instructed her how to send a manual transmission w/ her home monitor. Pt verbalized understanding.  

## 2017-07-24 NOTE — Telephone Encounter (Signed)
New message      1. Has your device fired? No   2. Is you device beeping? noi  3. Are you experiencing draining or swelling at device site? no  4. Are you calling to see if we received your device transmission?  Yes , device will not transmit  5. Have you passed out? no    Please route to [place]

## 2017-07-24 NOTE — Telephone Encounter (Signed)
Patient attempted to send remote transmission x 4 unsuccessfully. Gave patient tech services number for troubleshooting help. Advised that power outages in the area have affected home monitor.

## 2017-07-24 NOTE — Telephone Encounter (Signed)
New Message  Pt was call about remote transmission .please call back to discuss

## 2017-07-24 NOTE — Telephone Encounter (Signed)
LMOVM reminding pt to send remote transmission.   

## 2017-07-25 ENCOUNTER — Telehealth: Payer: Self-pay | Admitting: Internal Medicine

## 2017-07-25 ENCOUNTER — Encounter (HOSPITAL_COMMUNITY): Payer: Medicare Other

## 2017-07-25 MED ORDER — METHYLPREDNISOLONE 4 MG PO TBPK: ORAL_TABLET | ORAL | 0 refills | Status: DC

## 2017-07-25 MED ORDER — DOXYCYCLINE HYCLATE 100 MG PO TABS: 100.0000 mg | ORAL_TABLET | Freq: Two times a day (BID) | ORAL | 0 refills | Status: DC

## 2017-07-25 NOTE — Telephone Encounter (Signed)
Triage  I called [name] to give results of a research study. During this time she told she still has AECOPD despite PCP Rx 07/13/17. Please do  Take doxycycline 100mg  po twice daily x 5 days; take after meals and avoid sunlight  And   Prednisolone (medrol) dose pak - 7 days  Dr. Brand Males, [name], [facility].C.P Pulmonary and Critical Care Medicine Staff Physician [place] [facility] Pager: [phone], If no answer or between  15:00h - 7:00h: call [phone]  07/25/2017 11:06 AM

## 2017-07-25 NOTE — Telephone Encounter (Signed)
Title: Randomised, Double-Blind (Sponsor Open), Placebo-Controlled, Multicentre, Dose Ranging Study to Evaluate the Efficacy and Safety of Danirixin Tablets Administered Twice Daily Compared With Placebo for 24 Weeks in Adults With COPD  Sponsor: [employer] , Protoocol Number: [id number], NCT Trial #: [id number] //////////////////////////////////////////  [name] 06-Mar-1946 : Status update - [place] Safety and Advisory Letter dated 07/19/2017. I discussed interim results of above study with subject. Explained no benefit and increase pneumonia risk, musculoskeletal AE and headadaches seen with IP in the above study. It is currently not known what patient received - placebo v  IP dosage. Subject thankful for updated. Thanked subject for study participation. I was her treating pulmonologist and have informed myself. In addition,  note sent to PCP Ria Bush, MD as required by the safety letter   Dr. Brand Males, [name], [facility].C.P Pulmonary and Critical Care Medicine Staff Physician [place] [facility] Pager: [phone], If no answer or between  15:00h - 7:00h: call [phone]  07/25/2017 11:03 AM

## 2017-07-27 ENCOUNTER — Encounter (HOSPITAL_COMMUNITY): Payer: Medicare Other

## 2017-07-28 NOTE — Progress Notes (Signed)
Remote pacemaker transmission.   

## 2017-07-31 ENCOUNTER — Ambulatory Visit: Payer: Medicare Other | Admitting: Internal Medicine

## 2017-08-01 ENCOUNTER — Encounter (HOSPITAL_COMMUNITY): Payer: Medicare Other

## 2017-08-02 ENCOUNTER — Ambulatory Visit (INDEPENDENT_AMBULATORY_CARE_PROVIDER_SITE_OTHER): Payer: Medicare Other | Admitting: Internal Medicine

## 2017-08-02 ENCOUNTER — Encounter: Payer: Self-pay | Admitting: Internal Medicine

## 2017-08-02 VITALS — BP 130/68 | HR 72 | Ht 66.0 in | Wt 214.0 lb

## 2017-08-02 DIAGNOSIS — J449 Chronic obstructive pulmonary disease, unspecified: Secondary | ICD-10-CM

## 2017-08-02 DIAGNOSIS — R0982 Postnasal drip: Secondary | ICD-10-CM | POA: Diagnosis not present

## 2017-08-02 NOTE — Progress Notes (Signed)
Subjective:     Patient ID: [name], female   DOB: 1946/10/04, 71 y.o.   MRN: [id number]  HPI   OV 08/02/2017  Chief Complaint  Patient presents with  . Follow-up    Pt states that she got bronchitis x1 month ago. Has been on two rounds of abx and two rounds of steroids. States that it is not in her chest anymore but is still having drainage, sore throat, dry cough, occ. SOB. Denies any CP.    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 She comes for routine follow-up. Recently she had COPD flareup was treated by primary care. Then I called in antibiotic and repeat prednisone and this again helped her. Nevertheless she is frustrated by postnasal drainage and the resultant cough. She says normally for COPD exacerbations are associated with initial onset of sinus congestion. Then she's always left with a residual postnasal drip but this time it has lingered longer. She says Sudafed helps her but is no longer available over-the-counter. She really wants relief about this critically. COPD cat score is 14. Is also asking me to sign handicap parking sticker    CAT COPD Symptom & Quality of Life Score ([place] trademark) 0 is no burden. 5 is highest burden 08/02/2017   Never Cough -> Cough all the time 1  No phlegm in chest -> Chest is full of phlegm 0  No chest tightness -> Chest feels very tight 1  No dyspnea for 1 flight stairs/hill -> Very dyspneic for 1 flight of stairs 5  No limitations for ADL at home -> Very limited with ADL at home 1  Confident leaving home -> Not at all confident leaving home 0  Sleep soundly -> Do not sleep soundly because of lung condition 4  Lots of Energy -> No energy at all 2  TOTAL Score (max 40)  14       has a past medical history of Acute cystitis without hematuria (12/22/2015); Anemia; Arthritis; CAP (community acquired pneumonia) (02/08/2016); Cataracts, bilateral; Chronic diastolic heart failure ([place]) (09/19/2014);  Chronic insomnia; Constipation; COPD (chronic obstructive pulmonary disease) ([place]); DDD (degenerative disc disease); Essential hypertension; GERD (gastroesophageal reflux disease); History of radiation therapy (05/30/11 to 07/07/11); History of shingles; Hyperlipemia; Legally blind in left eye, as defined in [place]; Lung nodule; Obesity; Osteopenia (12/2014); Pancreatitis (11/2014); Rectal cancer ([place]) (04/2011); Rheumatic heart disease mitral stenosis; S/P AVR (aortic valve replacement) (2015); S/P MVR (mitral valve replacement) (2015); Seizures ([place]); Severe aortic valve stenosis  AVR 3/15 (10/01/2013); Sleep apnea; Small bowel obstruction, partial ([employer]) (08/2013); and Vitamin B12 deficiency (02/28/2015).   reports that she quit smoking about 42 years ago. Her smoking use included Cigarettes. She has a 15.00 pack-year smoking history. She has never used smokeless tobacco.  Past Surgical History:  Procedure Laterality Date  . AORTIC VALVE REPLACEMENT N/A 12/12/2013   Procedure: AORTIC VALVE REPLACEMENT (AVR);  Surgeon: Gaye Pollack, MD; Service: Open Heart Surgery  . BACK SURGERY  2006, 2007   2006 SPACER, 2007 decompression and fusion L4/5  . BOWEL RESECTION  04/02/2012   Procedure: SMALL BOWEL RESECTION;  Surgeon: Stark Klein, MD;  Location: [facility];  Service: General;  Laterality: N/A;  . CARPAL TUNNEL RELEASE Bilateral   . CATARACT EXTRACTION W/ INTRAOCULAR LENS IMPLANT Left 10/18/2016  . CHOLECYSTECTOMY N/A 01/21/2015   chronic cholecystitis, Stark Klein, MD  . COLON RESECTION  08/25/2011   Procedure: COLON RESECTION LAPAROSCOPIC;  Surgeon: Stark Klein,  MD;  Location: [facility];  Service: General;  Laterality: N/A;  Laparoscopic Assisted Low Anterior Resection Diverting Ostomy and onQ pain pump  . COLONOSCOPY  03/2013   1 polyp, rpt 3 yrs Ardis Hughs)  . COLONOSCOPY WITH PROPOFOL N/A 06/09/2016   patent colo-colonic anastomosis, rpt 5 yrs Ardis Hughs)  . EP IMPLANTABLE DEVICE N/A 06/16/2016   Procedure:  Pacemaker Implant;  Surgeon: Will Meredith Leeds, MD;  Location: [facility];  Service: Cardiovascular;  Laterality: N/A;  . FOOT SURGERY  left foot   hammer toe  . HAND SURGERY  Bil   carpal tunnel  . ILEOSTOMY  08/25/2011  . ILEOSTOMY CLOSURE  04/02/2012   Procedure: ILEOSTOMY TAKEDOWN;  Surgeon: Stark Klein, MD;  Location: [facility];  Service: General;  Laterality: N/A;  . INTRAOPERATIVE TRANSESOPHAGEAL ECHOCARDIOGRAM N/A 12/12/2013   Procedure: INTRAOPERATIVE TRANSESOPHAGEAL ECHOCARDIOGRAM;  Surgeon: Gaye Pollack, MD;  Location: [place] OR;  Service: Open Heart Surgery;  Laterality: N/A;  . LEFT AND RIGHT HEART CATHETERIZATION WITH CORONARY ANGIOGRAM N/A 11/27/2013   Procedure: LEFT AND RIGHT HEART CATHETERIZATION WITH CORONARY ANGIOGRAM;  Surgeon: Sanda Klein, MD;  Location: [facility] C[facility];  Service: Cardiovascular;  Laterality: N/A;  . MITRAL VALVE REPLACEMENT N/A 12/12/2013   Procedure: MITRAL VALVE (MV) REPLACEMENT OR REPAIR;  Surgeon: Gaye Pollack, MD; Service: Open Heart Surgery  . TEE WITHOUT CARDIOVERSION N/A 11/29/2013   Procedure: TRANSESOPHAGEAL ECHOCARDIOGRAM (TEE);  Surgeon: Sueanne Margarita, MD;  Location: [facility] ENDOS[facility];  Service: Cardiovascular;  Laterality: N/A;  . TONSILLECTOMY      Allergies  Allergen Reactions  . Codeine Nausea Only  . Prednisone Hives    In different places all over the body.  . Sulfonamide Derivatives Nausea Only    Immunization History  Administered Date(s) Administered  . H1N1 09/17/2008  . Influenza Split 07/18/2012  . Influenza Whole 08/10/2005, 06/24/2009, 07/11/2011  . Influenza, High Dose Seasonal PF 07/04/2016, 07/04/2017  . Influenza-Unspecified 07/24/2013, 06/30/2014, 06/30/2015  . Pneumococcal Conjugate-13 06/02/2014  . Pneumococcal Polysaccharide-23 08/10/2005, 04/21/2008, 06/25/2013  . Td 10/11/2005  . Zoster 09/16/2014    Family History  Problem Relation Age of Onset  . Lung cancer Father   . Heart disease Mother   .  Diabetes Mother   . Hypertension Mother   . Stroke Mother   . CAD Son 62  . Heart attack Son 26  . Heart attack Maternal Grandfather   . Breast cancer Maternal Aunt 83  . Colon cancer Neg Hx   . Esophageal cancer Neg Hx   . Stomach cancer Neg Hx   . Rectal cancer Neg Hx      Current Outpatient Prescriptions:  .  albuterol (PROVENTIL HFA;VENTOLIN HFA) 108 (90 Base) MCG/ACT inhaler, Inhale 2 puffs into the lungs every 6 (six) hours as needed for wheezing., Disp: 1 Inhaler, Rfl: 2 .  amoxicillin (AMOXIL) 500 MG capsule, take 4 capsules by mouth 30-60 MINUTES PRIOR TO DENTAL APPT, Disp: 4 capsule, Rfl: 12 .  aspirin 81 MG tablet, Take 81 mg by mouth at bedtime. , Disp: , Rfl:  .  BIOTIN PO, Take 1 tablet by mouth at bedtime. , Disp: , Rfl:  .  bisoprolol (ZEBETA) 5 MG tablet, take 1/2 tablet by mouth once daily, Disp: 15 tablet, Rfl: 5 .  Calcium Carbonate-Vitamin D (CALCIUM 600 + D PO), Take 600 mg by mouth at bedtime. , Disp: , Rfl:  .  Cholecalciferol (VITAMIN D) 2000 UNITS CAPS, Take 2,000 Units by mouth at bedtime. ,  Disp: , Rfl:  .  fluticasone furoate-vilanterol (BREO ELLIPTA) 200-25 MCG/INH AEPB, Inhale 1 puff into the lungs daily., Disp: 60 each, Rfl: 8 .  furosemide (LASIX) 40 MG tablet, take 1 tablet by mouth once daily 5 TIMES A WEEK, Disp: 20 tablet, Rfl: 5 .  ipratropium-albuterol (DUONEB) 0.5-2.5 (3) MG/3ML SOLN, Take 3 mLs by nebulization every 6 (six) hours as needed. (Patient taking differently: Take 3 mLs by nebulization every 6 (six) hours as needed (for wheezing or shortness of breath). ), Disp: 360 mL, Rfl: 0 .  lovastatin (MEVACOR) 40 MG tablet, take 1 tablet by mouth at bedtime, Disp: 30 tablet, Rfl: 5 .  Miconazole Nitrate 2 % POWD, Apply 1 application topically daily as needed (for skin). Apply to stomach and under breasts, Disp: , Rfl:  .  montelukast (SINGULAIR) 10 MG tablet, take 1 tablet by mouth at bedtime, Disp: 30 tablet, Rfl: 5 .  polycarbophil (FIBERCON) 625  MG tablet, Take 625 mg by mouth daily., Disp: , Rfl:  .  polyethylene glycol (MIRALAX / GLYCOLAX) packet, Take 17 g by mouth daily., Disp: 14 each, Rfl: 0 .  potassium chloride (K-DUR) 10 MEQ tablet, take 2 tablets by mouth once daily, Disp: 60 tablet, Rfl: 5 .  SPIRIVA HANDIHALER 18 MCG inhalation capsule, inhale the contents of one capsule in the handihaler once daily, Disp: 30 capsule, Rfl: 5 .  traMADol (ULTRAM) 50 MG tablet, Take 1 tablet (50 mg total) by mouth every 6 (six) hours as needed for moderate pain., Disp: 30 tablet, Rfl: 0 .  vitamin B-12 (CYANOCOBALAMIN) 500 MCG tablet, Take 500 mcg by mouth daily., Disp: , Rfl:    Review of Systems     Objective:   Physical Exam  Constitutional: She is oriented to person, place, and time. She appears well-developed and well-nourished. No distress.  HENT:  Head: Normocephalic and atraumatic.  Right Ear: External ear normal.  Left Ear: External ear normal.  Mouth/Throat: Oropharynx is clear and moist. No oropharyngeal exudate.  Nasal cruds + Very mild post nasal drip only   Eyes: Pupils are equal, round, and reactive to light. Conjunctivae and EOM are normal. Right eye exhibits no discharge. Left eye exhibits no discharge. No scleral icterus.  Neck: Normal range of motion. Neck supple. No JVD present. No tracheal deviation present. No thyromegaly present.  Cardiovascular: Normal rate, regular rhythm, normal heart sounds and intact distal pulses.  Exam reveals no gallop and no friction rub.   No murmur heard. Pulmonary/Chest: Effort normal and breath sounds normal. No respiratory distress. She has no wheezes. She has no rales. She exhibits no tenderness.  occ right side wheeze  Abdominal: Soft. Bowel sounds are normal. She exhibits no distension and no mass. There is no tenderness. There is no rebound and no guarding.  Musculoskeletal: Normal range of motion. She exhibits no edema or tenderness.  Lymphadenopathy:    She has no cervical  adenopathy.  Neurological: She is alert and oriented to person, place, and time. She has normal reflexes. No cranial nerve deficit. She exhibits normal muscle tone. Coordination normal.  Skin: Skin is warm and dry. No rash noted. She is not diaphoretic. No erythema. No pallor.  Psychiatric: She has a normal mood and affect. Her behavior is normal. Judgment and thought content normal.  Vitals reviewed.  Vitals:   08/02/17 1009  BP: 130/68  Pulse: 72  SpO2: 97%  Weight: 214 lb (97.1 kg)  Height: 5\' 6"  (1.676 m)  Estimated body mass index is 34.54 kg/m as calculated from the following:   Height as of this encounter: 5\' 6"  (1.676 m).   Weight as of this encounter: 214 lb (97.1 kg).     Assessment:       ICD-10-CM   1. COPD, moderate ([place]) J44.9   2. Post-nasal drip R09.82        Plan:      Copd   - is stable and improved from recent exacerbation - continue regular inhalers - take handicap placard  Post nasal drip   - this is residue from recent copd flare up -  take generic fluticasone inhaler 2 squirts each nostril daily - start nasal saline OTC at night - start Sudafed 30mg  tablets (/12 - 1) as needed - not more than 1-2 per day x 5 days  Followup  - rourinte in 3-6 months or sooner if needed   Dr. Brand Males, [name], [facility].C.P Pulmonary and Critical Care Medicine Staff Physician [place] [facility] Pager: [phone], If no answer or between  15:00h - 7:00h: call [phone]  08/02/2017 10:41 AM

## 2017-08-02 NOTE — Patient Instructions (Signed)
ICD-10-CM   1. COPD, moderate ([place]) J44.9   2. Post-nasal drip R09.82     Copd   - is stable and improved from recent exacerbation - continue regular inhalers - take handicap placard  Post nasal drip   - this is residue from recent copd flare up -  take generic fluticasone inhaler 2 squirts each nostril daily - start nasal saline OTC at night - start Sudafed 30mg  tablets (/12 - 1) as needed - not more than 1-2 per day x 5 days  Followup  - rourinte in 3-6 months or sooner if needed

## 2017-08-03 ENCOUNTER — Encounter (HOSPITAL_COMMUNITY): Payer: Medicare Other

## 2017-08-04 ENCOUNTER — Encounter: Payer: Self-pay | Admitting: Cardiology

## 2017-08-08 ENCOUNTER — Encounter (HOSPITAL_COMMUNITY): Payer: Medicare Other

## 2017-08-09 ENCOUNTER — Encounter: Payer: Self-pay | Admitting: Family Medicine

## 2017-08-09 ENCOUNTER — Ambulatory Visit (INDEPENDENT_AMBULATORY_CARE_PROVIDER_SITE_OTHER): Payer: Medicare Other | Admitting: Family Medicine

## 2017-08-09 VITALS — BP 122/64 | HR 92 | Temp 97.8°F | Wt 226.0 lb

## 2017-08-09 DIAGNOSIS — M533 Sacrococcygeal disorders, not elsewhere classified: Secondary | ICD-10-CM | POA: Diagnosis not present

## 2017-08-09 HISTORY — DX: Sacrococcygeal disorders, not elsewhere classified: M53.3

## 2017-08-09 MED ORDER — METHYLPREDNISOLONE 4 MG PO TBPK: ORAL_TABLET | ORAL | 0 refills | Status: DC

## 2017-08-09 NOTE — Progress Notes (Signed)
BP 122/64 (BP Location: Left Arm, Patient Position: Sitting, Cuff Size: Large)   Pulse 92   Temp 97.8 F (36.6 C) (Oral)   Wt 226 lb (102.5 kg)   SpO2 95%   BMI 36.48 kg/m    CC: R hip pain "I don't feel good" Subjective:    Patient ID: [name], female    DOB: [birth year], 72 y.o.   MRN: [id number]  HPI: [name] is a 71 y.o. female presenting on 08/09/2017 for Hip Pain (right hip pain started about 1 wk ago. Constant achy pain radiating down the leg. Has alternated heat/ice therapy, barely helpful)   Recently lost family friend   Recent treatment for bronchitis and COPD exacerbation treated several times (abx and prednisone), latest by Dr Lynford Citizen with PNDrainage - treated with flonase and pseudophed and nasal saline - this has improved. Oral steroid completed 2 wks ago.  Now with 1 week h/o R leg pain that starts in buttock and travels down posterior knee. Alternating ice/heat without significant improvement. No fevers/chills, denies numbness or paresthesias of leg, no weakness of leg, no bowel or bladder accidents, no saddle anesthesia. Worse with active and standing.   She is trying to avoid ibuprofen and other NSAIDs. Lab Results  Component Value Date   CREATININE 0.95 06/05/2017  h/o CKD stage 3 that improved with decreased ibuprofen use.  Relevant past medical, surgical, family and social history reviewed and updated as indicated. Interim medical history since our last visit reviewed. Allergies and medications reviewed and updated. Outpatient Medications Prior to Visit  Medication Sig Dispense Refill  . albuterol (PROVENTIL HFA;VENTOLIN HFA) 108 (90 Base) MCG/ACT inhaler Inhale 2 puffs into the lungs every 6 (six) hours as needed for wheezing. 1 Inhaler 2  . amoxicillin (AMOXIL) 500 MG capsule take 4 capsules by mouth 30-60 MINUTES PRIOR TO DENTAL APPT 4 capsule 12  . aspirin 81 MG tablet Take 81 mg by mouth at bedtime.     Marland Kitchen BIOTIN PO Take 1 tablet by  mouth at bedtime.     . bisoprolol (ZEBETA) 5 MG tablet take 1/2 tablet by mouth once daily 15 tablet 5  . Calcium Carbonate-Vitamin D (CALCIUM 600 + D PO) Take 600 mg by mouth at bedtime.     . Cholecalciferol (VITAMIN D) 2000 UNITS CAPS Take 2,000 Units by mouth at bedtime.     . fluticasone furoate-vilanterol (BREO ELLIPTA) 200-25 MCG/INH AEPB Inhale 1 puff into the lungs daily. 60 each 8  . furosemide (LASIX) 40 MG tablet take 1 tablet by mouth once daily 5 TIMES A WEEK 20 tablet 5  . ipratropium-albuterol (DUONEB) 0.5-2.5 (3) MG/3ML SOLN Take 3 mLs by nebulization every 6 (six) hours as needed. (Patient taking differently: Take 3 mLs by nebulization every 6 (six) hours as needed (for wheezing or shortness of breath). ) 360 mL 0  . lovastatin (MEVACOR) 40 MG tablet take 1 tablet by mouth at bedtime 30 tablet 5  . Miconazole Nitrate 2 % POWD Apply 1 application topically daily as needed (for skin). Apply to stomach and under breasts    . montelukast (SINGULAIR) 10 MG tablet take 1 tablet by mouth at bedtime 30 tablet 5  . polycarbophil (FIBERCON) 625 MG tablet Take 625 mg by mouth daily.    . polyethylene glycol (MIRALAX / GLYCOLAX) packet Take 17 g by mouth daily. 14 each 0  . potassium chloride (K-DUR) 10 MEQ tablet take 2 tablets by mouth once daily 60 tablet  5  . SPIRIVA HANDIHALER 18 MCG inhalation capsule inhale the contents of one capsule in the handihaler once daily 30 capsule 5  . traMADol (ULTRAM) 50 MG tablet Take 1 tablet (50 mg total) by mouth every 6 (six) hours as needed for moderate pain. 30 tablet 0  . vitamin B-12 (CYANOCOBALAMIN) 500 MCG tablet Take 500 mcg by mouth daily.     No facility-administered medications prior to visit.      Per HPI unless specifically indicated in ROS section below Review of Systems     Objective:    BP 122/64 (BP Location: Left Arm, Patient Position: Sitting, Cuff Size: Large)   Pulse 92   Temp 97.8 F (36.6 C) (Oral)   Wt 226 lb (102.5  kg)   SpO2 95%   BMI 36.48 kg/m   Wt Readings from Last 3 Encounters:  08/09/17 226 lb (102.5 kg)  08/02/17 214 lb (97.1 kg)  07/13/17 218 lb (98.9 kg)    Physical Exam  Constitutional: She is oriented to person, place, and time. She appears well-developed and well-nourished. No distress.  Musculoskeletal: She exhibits no edema.  No pain midline spine No paraspinous mm tenderness Neg SLR bilaterally. No pain with int/ext rotation at hip. Pos FABER on R. No pain at GTB or sciatic notch bilaterally.  Painful to palpation of R SIJ  Neurological: She is alert and oriented to person, place, and time.  5/5 strength BLE Sensation intact  Nursing note and vitals reviewed.     Assessment & Plan:   Problem List Items Addressed This Visit    Pain of right sacroiliac joint - Primary    Anticipate R sacroiliitis. Discussed with patient, rec ice, heating pad alternating as well as medrol dosepack (avoid prednisone in h/o this), discussed limited NSAID use, provided with exercises from [facility] pt advisor on sacroiliac pain. Update if not improving, consider PM&R referral to discuss injection      Relevant Medications   methylPREDNISolone (MEDROL DOSEPAK) 4 MG TBPK tablet       Follow up plan: Return if symptoms worsen or fail to improve.  Ria Bush, MD

## 2017-08-09 NOTE — Assessment & Plan Note (Signed)
Anticipate R sacroiliitis. Discussed with patient, rec ice, heating pad alternating as well as medrol dosepack (avoid prednisone in h/o this), discussed limited NSAID use, provided with exercises from [facility] pt advisor on sacroiliac pain. Update if not improving, consider PM&R referral to discuss injection

## 2017-08-09 NOTE — Patient Instructions (Addendum)
I think you have sacroiliitis - or inflammation of the sacroiliac joint.  Continue ice/heating pad to the area. Treat with another prednisone course x 1 week then may take ibuprofen 400mg  twice daily with meals for 5 days.  Do exercises provided today.  Let us know if not improving with this, next step may be referral to consider SI joint injection.   Sacroiliac Joint Dysfunction Sacroiliac joint dysfunction is a condition that causes inflammation on one or both sides of the sacroiliac (SI) joint. The SI joint connects the lower part of the spine (sacrum) with the two upper portions of the pelvis (ilium). This condition causes deep aching or burning pain in the low back. In some cases, the pain may also spread into one or both buttocks or hips or spread down the legs. What are the causes? This condition may be caused by:  Pregnancy. During pregnancy, extra stress is put on the SI joints because the pelvis widens.  Injury, such as: ? Car accidents. ? Sport-related injuries. ? Work-related injuries.  Having one leg that is shorter than the other.  Conditions that affect the joints, such as: ? Rheumatoid arthritis. ? Gout. ? Psoriatic arthritis. ? Joint infection (septic arthritis).  Sometimes, the cause of SI joint dysfunction is not known. What are the signs or symptoms? Symptoms of this condition include:  Aching or burning pain in the lower back. The pain may also spread to other areas, such as: ? Buttocks. ? Groin. ? Thighs and legs.  Muscle spasms in or around the painful areas.  Increased pain when standing, walking, running, stair climbing, bending, or lifting.  How is this diagnosed? Your health care provider will do a physical exam and take your medical history. During the exam, the health care provider may move one or both of your legs to different positions to check for pain. Various tests may be done to help verify the diagnosis, including:  Imaging tests to look  for other causes of pain. These may include: ? MRI. ? CT scan. ? Bone scan.  Diagnostic injection. A numbing medicine is injected into the SI joint using a needle. If the pain is temporarily improved or stopped after the injection, this can indicate that SI joint dysfunction is the problem.  How is this treated? Treatment may vary depending on the cause and severity of your condition. Treatment options may include:  Applying ice or heat to the lower back area. This can help to reduce pain and muscle spasms.  Medicines to relieve pain or inflammation or to relax the muscles.  Wearing a back brace (sacroiliac brace) to help support the joint while your back is healing.  Physical therapy to increase muscle strength around the joint and flexibility at the joint. This may also involve learning proper body positions and ways of moving to relieve stress on the joint.  Direct manipulation of the SI joint.  Injections of steroid medicine into the joint in order to reduce pain and swelling.  Radiofrequency ablation to burn away nerves that are carrying pain messages from the joint.  Use of a device that provides electrical stimulation in order to reduce pain at the joint.  Surgery to put in screws and plates that limit or prevent joint motion. This is rare.  Follow these instructions at home:  Rest as needed. Limit your activities as directed by your health care provider.  Take medicines only as directed by your health care provider.  If directed, apply ice to the  affected area: ? Put ice in a plastic bag. ? Place a towel between your skin and the bag. ? Leave the ice on for 20 minutes, 2-3 times per day.  Use a heating pad or a moist heat pack as directed by your health care provider.  Exercise as directed by your health care provider or physical therapist.  Keep all follow-up visits as directed by your health care provider. This is important. Contact a health care provider  if:  Your pain is not controlled with medicine.  You have a fever.  You have increasingly severe pain. Get help right away if:  You have weakness, numbness, or tingling in your legs or feet.  You lose control of your bladder or bowel. This information is not intended to replace advice given to you by your health care provider. Make sure you discuss any questions you have with your health care provider. Document Released: 12/23/2008 Document Revised: 03/03/2016 Document Reviewed: 06/03/2014 Elsevier Interactive Patient Education  [employer].

## 2017-08-10 ENCOUNTER — Encounter (HOSPITAL_COMMUNITY): Payer: Medicare Other | Attending: Internal Medicine

## 2017-08-10 DIAGNOSIS — Z954 Presence of other heart-valve replacement: Secondary | ICD-10-CM | POA: Insufficient documentation

## 2017-08-10 DIAGNOSIS — R0600 Dyspnea, unspecified: Secondary | ICD-10-CM | POA: Insufficient documentation

## 2017-08-11 LAB — CUP PACEART REMOTE DEVICE CHECK
Battery Remaining Longevity: 113 mo
Battery Remaining Percentage: 95.5 %
Battery Voltage: 3.01 V
Brady Statistic AP VP Percent: 45 %
Brady Statistic AP VS Percent: 4.6 %
Brady Statistic AS VP Percent: 1.6 %
Brady Statistic AS VS Percent: 47 %
Brady Statistic RA Percent Paced: 49 %
Brady Statistic RV Percent Paced: 47 %
Date Time Interrogation Session: 20181016184103
Implantable Lead Implant Date: 20170907
Implantable Lead Implant Date: 20170907
Implantable Lead Location: 753859
Implantable Lead Location: 753860
Implantable Pulse Generator Implant Date: 20170907
Lead Channel Impedance Value: 410 Ohm
Lead Channel Impedance Value: 660 Ohm
Lead Channel Pacing Threshold Amplitude: 0.5 V
Lead Channel Pacing Threshold Amplitude: 0.875 V
Lead Channel Pacing Threshold Pulse Width: 0.5 ms
Lead Channel Pacing Threshold Pulse Width: 0.5 ms
Lead Channel Sensing Intrinsic Amplitude: 10.3 mV
Lead Channel Sensing Intrinsic Amplitude: 5 mV
Lead Channel Setting Pacing Amplitude: 1.125
Lead Channel Setting Pacing Amplitude: 2 V
Lead Channel Setting Pacing Pulse Width: 0.5 ms
Lead Channel Setting Sensing Sensitivity: 2 mV
Pulse Gen Model: 2272
Pulse Gen Serial Number: 7945283

## 2017-08-15 ENCOUNTER — Encounter (HOSPITAL_COMMUNITY): Payer: Medicare Other

## 2017-08-17 ENCOUNTER — Encounter (HOSPITAL_COMMUNITY): Payer: Medicare Other

## 2017-08-22 ENCOUNTER — Encounter (HOSPITAL_COMMUNITY): Payer: Medicare Other

## 2017-08-22 ENCOUNTER — Encounter (HOSPITAL_COMMUNITY): Payer: Self-pay | Admitting: Emergency Medicine

## 2017-08-22 ENCOUNTER — Ambulatory Visit: Payer: Self-pay | Admitting: *Deleted

## 2017-08-22 ENCOUNTER — Emergency Department (HOSPITAL_COMMUNITY): Payer: Medicare Other

## 2017-08-22 ENCOUNTER — Inpatient Hospital Stay (HOSPITAL_COMMUNITY)
Admission: EM | Admit: 2017-08-22 | Discharge: 2017-08-24 | DRG: 872 | Disposition: A | Discharge status: Home Health | Payer: Medicare Other | Attending: Internal Medicine | Admitting: Internal Medicine

## 2017-08-22 DIAGNOSIS — I5032 Chronic diastolic (congestive) heart failure: Secondary | ICD-10-CM | POA: Diagnosis not present

## 2017-08-22 DIAGNOSIS — R7989 Other specified abnormal findings of blood chemistry: Secondary | ICD-10-CM

## 2017-08-22 DIAGNOSIS — I248 Other forms of acute ischemic heart disease: Secondary | ICD-10-CM | POA: Diagnosis present

## 2017-08-22 DIAGNOSIS — G4730 Sleep apnea, unspecified: Secondary | ICD-10-CM | POA: Diagnosis present

## 2017-08-22 DIAGNOSIS — I471 Supraventricular tachycardia: Secondary | ICD-10-CM | POA: Diagnosis present

## 2017-08-22 DIAGNOSIS — N390 Urinary tract infection, site not specified: Secondary | ICD-10-CM | POA: Diagnosis present

## 2017-08-22 DIAGNOSIS — N182 Chronic kidney disease, stage 2 (mild): Secondary | ICD-10-CM | POA: Diagnosis present

## 2017-08-22 DIAGNOSIS — A419 Sepsis, unspecified organism: Secondary | ICD-10-CM | POA: Diagnosis present

## 2017-08-22 DIAGNOSIS — H548 Legal blindness, as defined in USA: Secondary | ICD-10-CM | POA: Diagnosis present

## 2017-08-22 DIAGNOSIS — N183 Chronic kidney disease, stage 3 (moderate): Secondary | ICD-10-CM | POA: Diagnosis present

## 2017-08-22 DIAGNOSIS — R001 Bradycardia, unspecified: Secondary | ICD-10-CM | POA: Diagnosis present

## 2017-08-22 DIAGNOSIS — I10 Essential (primary) hypertension: Secondary | ICD-10-CM | POA: Diagnosis present

## 2017-08-22 DIAGNOSIS — R778 Other specified abnormalities of plasma proteins: Secondary | ICD-10-CM

## 2017-08-22 DIAGNOSIS — B9620 Unspecified Escherichia coli [E. coli] as the cause of diseases classified elsewhere: Secondary | ICD-10-CM | POA: Diagnosis present

## 2017-08-22 DIAGNOSIS — Z923 Personal history of irradiation: Secondary | ICD-10-CM | POA: Diagnosis not present

## 2017-08-22 DIAGNOSIS — Z952 Presence of prosthetic heart valve: Secondary | ICD-10-CM

## 2017-08-22 DIAGNOSIS — Z882 Allergy status to sulfonamides status: Secondary | ICD-10-CM | POA: Diagnosis not present

## 2017-08-22 DIAGNOSIS — I080 Rheumatic disorders of both mitral and aortic valves: Secondary | ICD-10-CM | POA: Diagnosis present

## 2017-08-22 DIAGNOSIS — Z885 Allergy status to narcotic agent status: Secondary | ICD-10-CM | POA: Diagnosis not present

## 2017-08-22 DIAGNOSIS — I130 Hypertensive heart and chronic kidney disease with heart failure and stage 1 through stage 4 chronic kidney disease, or unspecified chronic kidney disease: Secondary | ICD-10-CM | POA: Diagnosis present

## 2017-08-22 DIAGNOSIS — R748 Abnormal levels of other serum enzymes: Secondary | ICD-10-CM | POA: Diagnosis not present

## 2017-08-22 DIAGNOSIS — I441 Atrioventricular block, second degree: Secondary | ICD-10-CM | POA: Diagnosis present

## 2017-08-22 DIAGNOSIS — K219 Gastro-esophageal reflux disease without esophagitis: Secondary | ICD-10-CM | POA: Diagnosis present

## 2017-08-22 DIAGNOSIS — J449 Chronic obstructive pulmonary disease, unspecified: Secondary | ICD-10-CM | POA: Diagnosis present

## 2017-08-22 DIAGNOSIS — C20 Malignant neoplasm of rectum: Secondary | ICD-10-CM | POA: Diagnosis present

## 2017-08-22 DIAGNOSIS — Z888 Allergy status to other drugs, medicaments and biological substances status: Secondary | ICD-10-CM

## 2017-08-22 DIAGNOSIS — Z9221 Personal history of antineoplastic chemotherapy: Secondary | ICD-10-CM

## 2017-08-22 DIAGNOSIS — I5030 Unspecified diastolic (congestive) heart failure: Secondary | ICD-10-CM | POA: Diagnosis present

## 2017-08-22 DIAGNOSIS — E785 Hyperlipidemia, unspecified: Secondary | ICD-10-CM | POA: Diagnosis present

## 2017-08-22 DIAGNOSIS — Z950 Presence of cardiac pacemaker: Secondary | ICD-10-CM

## 2017-08-22 DIAGNOSIS — G8929 Other chronic pain: Secondary | ICD-10-CM | POA: Diagnosis present

## 2017-08-22 DIAGNOSIS — M8580 Other specified disorders of bone density and structure, unspecified site: Secondary | ICD-10-CM | POA: Diagnosis present

## 2017-08-22 DIAGNOSIS — N179 Acute kidney failure, unspecified: Secondary | ICD-10-CM | POA: Diagnosis present

## 2017-08-22 DIAGNOSIS — Z87891 Personal history of nicotine dependence: Secondary | ICD-10-CM

## 2017-08-22 DIAGNOSIS — Z951 Presence of aortocoronary bypass graft: Secondary | ICD-10-CM

## 2017-08-22 DIAGNOSIS — Z85048 Personal history of other malignant neoplasm of rectum, rectosigmoid junction, and anus: Secondary | ICD-10-CM

## 2017-08-22 HISTORY — DX: Urinary tract infection, site not specified: N39.0

## 2017-08-22 HISTORY — DX: Sepsis, unspecified organism: A41.9

## 2017-08-22 LAB — COMPREHENSIVE METABOLIC PANEL
ALT: 63 U/L — ABNORMAL HIGH (ref 14–54)
AST: 26 U/L (ref 15–41)
Albumin: 2.9 g/dL — ABNORMAL LOW (ref 3.5–5.0)
Alkaline Phosphatase: 116 U/L (ref 38–126)
Anion gap: 9 (ref 5–15)
BUN: 18 mg/dL (ref 6–20)
CO2: 22 mmol/L (ref 22–32)
Calcium: 8.6 mg/dL — ABNORMAL LOW (ref 8.9–10.3)
Chloride: 106 mmol/L (ref 101–111)
Creatinine, Ser: 1.34 mg/dL — ABNORMAL HIGH (ref 0.44–1.00)
GFR calc Af Amer: 45 mL/min — ABNORMAL LOW (ref >60)
GFR calc non Af Amer: 39 mL/min — ABNORMAL LOW (ref >60)
Glucose, Bld: 116 mg/dL — ABNORMAL HIGH (ref 65–99)
Potassium: 3.3 mmol/L — ABNORMAL LOW (ref 3.5–5.1)
Sodium: 137 mmol/L (ref 135–145)
Total Bilirubin: 1.4 mg/dL — ABNORMAL HIGH (ref 0.3–1.2)
Total Protein: 6 g/dL — ABNORMAL LOW (ref 6.5–8.1)

## 2017-08-22 LAB — I-STAT CG4 LACTIC ACID, ED
Lactic Acid, Venous: 2.09 mmol/L (ref 0.5–1.9)
Lactic Acid, Venous: 1.21 mmol/L (ref 0.5–1.9)

## 2017-08-22 LAB — CBC WITH DIFFERENTIAL/PLATELET
Basophils Absolute: 0 10*3/uL (ref 0.0–0.1)
Basophils Relative: 0 %
Eosinophils Absolute: 0 10*3/uL (ref 0.0–0.7)
Eosinophils Relative: 0 %
HCT: 42.4 % (ref 36.0–46.0)
Hemoglobin: 13.8 g/dL (ref 12.0–15.0)
Lymphocytes Relative: 9 %
Lymphs Abs: 0.8 10*3/uL (ref 0.7–4.0)
MCH: 31.1 pg (ref 26.0–34.0)
MCHC: 32.5 g/dL (ref 30.0–36.0)
MCV: 95.5 fL (ref 78.0–100.0)
Monocytes Absolute: 0.6 10*3/uL (ref 0.1–1.0)
Monocytes Relative: 6 %
Neutro Abs: 7.4 10*3/uL (ref 1.7–7.7)
Neutrophils Relative %: 85 %
Platelets: 129 10*3/uL — ABNORMAL LOW (ref 150–400)
RBC: 4.44 MIL/uL (ref 3.87–5.11)
RDW: 14.9 % (ref 11.5–15.5)
WBC: 8.7 10*3/uL (ref 4.0–10.5)

## 2017-08-22 LAB — URINALYSIS, ROUTINE W REFLEX MICROSCOPIC
Bilirubin Urine: NEGATIVE
Glucose, UA: NEGATIVE mg/dL
Ketones, ur: 5 mg/dL — AB
Nitrite: POSITIVE — AB
Protein, ur: 100 mg/dL — AB
Specific Gravity, Urine: 1.013 (ref 1.005–1.030)
Squamous Epithelial / LPF: NONE SEEN
pH: 6 (ref 5.0–8.0)

## 2017-08-22 LAB — TROPONIN I: Troponin I: 0.18 ng/mL (ref <0.03)

## 2017-08-22 LAB — I-STAT TROPONIN, ED: Troponin i, poc: 0.12 ng/mL (ref 0.00–0.08)

## 2017-08-22 MED ORDER — ASPIRIN 81 MG PO CHEW
81.0000 mg | CHEWABLE_TABLET | Freq: Every day | ORAL | Status: DC
  Administered 2017-08-23 (×2): 81 mg via ORAL
  Filled 2017-08-22 (×2): qty 1

## 2017-08-22 MED ORDER — SODIUM CHLORIDE 0.9 % IV BOLUS (SEPSIS)
1000.0000 mL | Freq: Once | INTRAVENOUS | Status: AC
  Administered 2017-08-22: 1000 mL via INTRAVENOUS

## 2017-08-22 MED ORDER — POLYETHYLENE GLYCOL 3350 17 G PO PACK: 17.0000 g | PACK | Freq: Every day | ORAL | Status: DC

## 2017-08-22 MED ORDER — PRAVASTATIN SODIUM 40 MG PO TABS
40.0000 mg | ORAL_TABLET | Freq: Every day | ORAL | Status: DC
  Administered 2017-08-22 – 2017-08-23 (×2): 40 mg via ORAL
  Filled 2017-08-22 (×2): qty 1

## 2017-08-22 MED ORDER — ASPIRIN 81 MG PO TABS: 81.0000 mg | ORAL_TABLET | Freq: Every day | ORAL | Status: DC

## 2017-08-22 MED ORDER — POTASSIUM CHLORIDE CRYS ER 20 MEQ PO TBCR
40.0000 meq | EXTENDED_RELEASE_TABLET | Freq: Once | ORAL | Status: AC
  Administered 2017-08-22: 40 meq via ORAL
  Filled 2017-08-22: qty 2

## 2017-08-22 MED ORDER — POTASSIUM CHLORIDE CRYS ER 20 MEQ PO TBCR
20.0000 meq | EXTENDED_RELEASE_TABLET | Freq: Every day | ORAL | Status: DC
  Administered 2017-08-22 – 2017-08-24 (×3): 20 meq via ORAL
  Filled 2017-08-22 (×2): qty 2
  Filled 2017-08-22: qty 1
  Filled 2017-08-22 (×2): qty 2

## 2017-08-22 MED ORDER — TRAMADOL HCL 50 MG PO TABS
50.0000 mg | ORAL_TABLET | Freq: Four times a day (QID) | ORAL | Status: DC | PRN
  Administered 2017-08-23 (×2): 50 mg via ORAL
  Filled 2017-08-22 (×2): qty 1

## 2017-08-22 MED ORDER — MONTELUKAST SODIUM 10 MG PO TABS
10.0000 mg | ORAL_TABLET | Freq: Every day | ORAL | Status: DC
  Administered 2017-08-23 (×2): 10 mg via ORAL
  Filled 2017-08-22 (×3): qty 1

## 2017-08-22 MED ORDER — ACETAMINOPHEN 500 MG PO TABS: 1000.0000 mg | ORAL_TABLET | Freq: Once | ORAL | Status: DC

## 2017-08-22 MED ORDER — CEFTRIAXONE SODIUM 2 G IJ SOLR
2.0000 g | Freq: Once | INTRAMUSCULAR | Status: AC
  Administered 2017-08-22: 2 g via INTRAVENOUS
  Filled 2017-08-22: qty 2

## 2017-08-22 MED ORDER — TIOTROPIUM BROMIDE MONOHYDRATE 18 MCG IN CAPS
18.0000 ug | ORAL_CAPSULE | Freq: Every day | RESPIRATORY_TRACT | Status: DC
  Administered 2017-08-22 – 2017-08-24 (×3): 18 ug via RESPIRATORY_TRACT
  Filled 2017-08-22: qty 5

## 2017-08-22 MED ORDER — IPRATROPIUM-ALBUTEROL 0.5-2.5 (3) MG/3ML IN SOLN: 3.0000 mL | Freq: Four times a day (QID) | RESPIRATORY_TRACT | Status: DC | PRN

## 2017-08-22 NOTE — Progress Notes (Signed)
RN paged critical trop, which is flat as compared to Istat trop performed earlier. See attending's comments about reason for elevated trop. Will continue to follow.  KJKG, NP [facility]

## 2017-08-22 NOTE — H&P (Signed)
History and Physical    [name]:[id number] DOB: Jan 30, 1946 DOA: 08/22/2017  PCP: Ria Bush, MD Patient coming from: home  Chief Complaint: fever, emesis  HPI: [name] is a 71 y.o. female with medical history significant of small bowel obstructions, MVR and aVR in 2015, seizures, rheumatic heart disease, rectal cancer, pancreatitis, legal blindness, hyperlipidemia, GERD, hypertension, COPD, cardiovascular congestive heart failure, anemia.  Patient presenting with three-day history of progressive generalized fatigue with intermittent fevers. Associated with urinary frequency and dysuria and foul-smelling urine. On the day prior to admission patient woke up with body aches and chills. Lightheaded with ambulation. Patient is not taken anything for her symptoms.  ED Course: Sepsis protocol initiated. Objective findings outlined below  Review of Systems: As per HPI otherwise all other systems reviewed and are negative  Ambulatory Status: No restrictions  Past Medical History:  Diagnosis Date  . Acute cystitis without hematuria 12/22/2015  . Anemia   . Arthritis   . CAP (community acquired pneumonia) 02/08/2016  . Cataracts, bilateral    immature  . Chronic diastolic heart failure ([place]) 09/19/2014  . Chronic insomnia   . Constipation    takes Miralax daily as needed  . COPD (chronic obstructive pulmonary disease) (HCC)    Albuterol inhaler daily as needed;Duoneb daily as needed;Spiriva daily  . DDD (degenerative disc disease)    cervical - kyphosis with mod DD changes C5/6 and C6/7; lumbar - early DD at L2/3 ([place])  . Essential hypertension    was on meds but after appointment Dec 11 with Dr.C he took her off  . GERD (gastroesophageal reflux disease)    takes Protonix daily  . History of radiation therapy 05/30/11 to 07/07/11   rectum  . History of shingles   . Hyperlipemia    takes Lovastatin daily  . Legally blind in left eye, as defined in [place]   .  Lung nodule    RLL nodule-93mm stable 2006, April 2009, and June 2009  . Obesity   . Osteopenia 12/2014   T -1.5 hip  . Pancreatitis 11/2014   ?zpack related vs gallstone pancreatitis with abnormal HIDA scan pending cholecystectomy  . Rectal cancer ([place]) 04/2011   T3N0; s/p lap LAR, s/p ileostomy, s/p reversal, s/p chemo  . Rheumatic heart disease mitral stenosis    mod MS by echo 02/2014  . S/P AVR (aortic valve replacement) 2015   bioprosthetic ([place])  . S/P MVR (mitral valve replacement) 2015   bioprosthetic ([place])  . Seizures ([place])    febrile seizures as a child  . Severe aortic valve stenosis  AVR 3/15 10/01/2013   mild-mod by echo 02/2014  . Sleep apnea    PT TOLD BORDERLINE-TRIED CPAP-DID NOT HELP-SHE DOES NOT USE CPAP  . Small bowel obstruction, partial ([place]) 08/2013   reolved without NGT placement.   . Vitamin B12 deficiency 02/28/2015   Start B12 shots 02/2015     Past Surgical History:  Procedure Laterality Date  . BACK SURGERY  2006, 2007   2006 SPACER, 2007 decompression and fusion L4/5  . CARPAL TUNNEL RELEASE Bilateral   . CATARACT EXTRACTION W/ INTRAOCULAR LENS IMPLANT Left 10/18/2016  . COLONOSCOPY  03/2013   1 polyp, rpt 3 yrs Ardis Hughs)  . FOOT SURGERY  left foot   hammer toe  . HAND SURGERY  Bil   carpal tunnel  . ILEOSTOMY  08/25/2011  . TONSILLECTOMY      Social History   Socioeconomic History  .  Marital status: Widowed    Spouse name: Not on file  . Number of children: Not on file  . Years of education: Not on file  . Highest education level: Not on file  Social Needs  . Financial resource strain: Not on file  . Food insecurity - worry: Not on file  . Food insecurity - inability: Not on file  . Transportation needs - medical: Not on file  . Transportation needs - non-medical: Not on file  Occupational History  . Occupation: retired  Tobacco Use  . Smoking status: Former Smoker    Packs/day: 1.50    Years: 10.00    Pack years: 15.00     Types: Cigarettes    Last attempt to quit: 12/28/1974    Years since quitting: 42.6  . Smokeless tobacco: Never Used  . Tobacco comment: quit smoking in 01/20/1975  Substance and Sexual Activity  . Alcohol use: Yes    Alcohol/week: 0.0 oz    Comment: rarely wine  . Drug use: No  . Sexual activity: No  Other Topics Concern  . Not on file  Social History Narrative   Widow. Husband died 04/22/2015 MVA. 1 cat.    2 sons; 1 grandchild. One son died of MI 41   Married 19-Jan-1965   Retired-[employer], volunteers at [employer]   Activity: pulm rehab   Diet: some water, fruits/vegetables daily    Allergies  Allergen Reactions  . Codeine Nausea Only  . Prednisone Hives    In different places all over the body.  . Sulfonamide Derivatives Nausea Only    Family History  Problem Relation Age of Onset  . Lung cancer Father   . Heart disease Mother   . Diabetes Mother   . Hypertension Mother   . Stroke Mother   . CAD Son 66  . Heart attack Son 59  . Heart attack Maternal Grandfather   . Breast cancer Maternal Aunt 83  . Colon cancer Neg Hx   . Esophageal cancer Neg Hx   . Stomach cancer Neg Hx   . Rectal cancer Neg Hx       Prior to Admission medications   Medication Sig Start Date End Date Taking? Authorizing Provider  albuterol (PROVENTIL HFA;VENTOLIN HFA) 108 (90 Base) MCG/ACT inhaler Inhale 2 puffs into the lungs every 6 (six) hours as needed for wheezing. 02/24/17   Brand Males, MD  amoxicillin (AMOXIL) 500 MG capsule take 4 capsules by mouth 30-60 MINUTES PRIOR TO DENTAL APPT 09/26/16   Croitoru, Mihai, MD  aspirin 81 MG tablet Take 81 mg by mouth at bedtime.     [provider]  BIOTIN PO Take 1 tablet by mouth at bedtime.     [provider]  bisoprolol (ZEBETA) 5 MG tablet take 1/2 tablet by mouth once daily 06/05/17   Croitoru, Mihai, MD  Calcium Carbonate-Vitamin D (CALCIUM 600 + D PO) Take 600 mg by mouth at bedtime.     [provider]  Cholecalciferol (VITAMIN  D) 2000 UNITS CAPS Take 2,000 Units by mouth at bedtime.     [provider]  fluticasone furoate-vilanterol (BREO ELLIPTA) 200-25 MCG/INH AEPB Inhale 1 puff into the lungs daily. 06/06/17   Ria Bush, MD  furosemide (LASIX) 40 MG tablet take 1 tablet by mouth once daily 5 TIMES A WEEK 06/05/17   Croitoru, Mihai, MD  ipratropium-albuterol (DUONEB) 0.5-2.5 (3) MG/3ML SOLN Take 3 mLs by nebulization every 6 (six) hours as needed. Patient taking differently: Take  3 mLs by nebulization every 6 (six) hours as needed (for wheezing or shortness of breath).  11/14/14   Hendricks Limes, MD  lovastatin (MEVACOR) 40 MG tablet take 1 tablet by mouth at bedtime 07/07/17   Ria Bush, MD  methylPREDNISolone (MEDROL DOSEPAK) 4 MG TBPK tablet Use as directed 08/09/17   Ria Bush, MD  Miconazole Nitrate 2 % POWD Apply 1 application topically daily as needed (for skin). Apply to stomach and under breasts    [provider]  montelukast (SINGULAIR) 10 MG tablet take 1 tablet by mouth at bedtime 06/05/17   Brand Males, MD  polycarbophil (FIBERCON) 625 MG tablet Take 625 mg by mouth daily.    [provider]  polyethylene glycol (MIRALAX / GLYCOLAX) packet Take 17 g by mouth daily. 12/02/13   Brett Canales, PA-C  potassium chloride (K-DUR) 10 MEQ tablet take 2 tablets by mouth once daily 06/05/17   Croitoru, Mihai, MD  [facility] HANDIHALER 18 MCG inhalation capsule inhale the contents of one capsule in the handihaler once daily 05/01/17   Brand Males, MD  traMADol (ULTRAM) 50 MG tablet Take 1 tablet (50 mg total) by mouth every 6 (six) hours as needed for moderate pain. 11/14/16   Ria Bush, MD  vitamin B-12 (CYANOCOBALAMIN) 500 MCG tablet Take 500 mcg by mouth daily.    [provider]    Physical Exam: Vitals:   08/22/17 1300 08/22/17 1315 08/22/17 1327 08/22/17 1400  BP: (!) 90/52 (!) 104/54  (!) 111/48  Pulse: (!) 108 92 98 84  Resp: (!) 23 17  18  (!) 22  Temp:      TempSrc:      SpO2: 95% 96% 96% 96%     General:  Appears calm and comfortable Eyes:  PERRL, EOMI, normal lids, iris ENT:  grossly normal hearing, lips & tongue, mmm Neck:  no LAD, masses or thyromegaly Cardiovascular:  RRR, systolic and diastolic murmur. No LE edema.  Respiratory:  CTA bilaterally, no w/r/r. Normal respiratory effort. Abdomen:  soft, ntnd, NABS Skin:  no rash or induration seen on limited exam Musculoskeletal:  grossly normal tone BUE/BLE, good ROM, no bony abnormality Psychiatric:  grossly normal mood and affect, speech fluent and appropriate, AOx3 Neurologic:  CN 2-12 grossly intact, moves all extremities in coordinated fashion, sensation intact  Labs on Admission: I have personally reviewed following labs and imaging studies  CBC: Recent Labs  Lab 08/22/17 1158  WBC 8.7  NEUTROABS 7.4  HGB 13.8  HCT 42.4  MCV 95.5  PLT 956*   Basic Metabolic Panel: Recent Labs  Lab 08/22/17 1158  NA 137  K 3.3*  CL 106  CO2 22  GLUCOSE 116*  BUN 18  CREATININE 1.34*  CALCIUM 8.6*   GFR: Estimated Creatinine Clearance: 45.9 mL/min (A) (by C-G formula based on SCr of 1.34 mg/dL (H)). Liver Function Tests: Recent Labs  Lab 08/22/17 1158  AST 26  ALT 63*  ALKPHOS 116  BILITOT 1.4*  PROT 6.0*  ALBUMIN 2.9*   No results for input(s): LIPASE, AMYLASE in the last 168 hours. No results for input(s): AMMONIA in the last 168 hours. Coagulation Profile: No results for input(s): INR, PROTIME in the last 168 hours. Cardiac Enzymes: No results for input(s): CKTOTAL, CKMB, CKMBINDEX, TROPONINI in the last 168 hours. BNP (last 3 results) No results for input(s): PROBNP in the last 8760 hours. HbA1C: No results for input(s): HGBA1C in the last 72 hours. CBG: No results for  input(s): GLUCAP in the last 168 hours. Lipid Profile: No results for input(s): CHOL, HDL, LDLCALC, TRIG, CHOLHDL, LDLDIRECT in the last 72 hours. Thyroid Function  Tests: No results for input(s): TSH, T4TOTAL, FREET4, T3FREE, THYROIDAB in the last 72 hours. Anemia Panel: No results for input(s): VITAMINB12, FOLATE, FERRITIN, TIBC, IRON, RETICCTPCT in the last 72 hours. Urine analysis:    Component Value Date/Time   COLORURINE AMBER (A) 08/22/2017 1229   APPEARANCEUR TURBID (A) 08/22/2017 1229   LABSPEC 1.013 08/22/2017 1229   PHURINE 6.0 08/22/2017 1229   GLUCOSEU NEGATIVE 08/22/2017 1229   HGBUR MODERATE (A) 08/22/2017 1229   BILIRUBINUR NEGATIVE 08/22/2017 1229   BILIRUBINUR Negative 12/22/2015 1628   KETONESUR 5 (A) 08/22/2017 1229   PROTEINUR 100 (A) 08/22/2017 1229   UROBILINOGEN 1.0 12/22/2015 1628   UROBILINOGEN 0.2 12/06/2013 1356   NITRITE POSITIVE (A) 08/22/2017 1229   LEUKOCYTESUR LARGE (A) 08/22/2017 1229    Creatinine Clearance: Estimated Creatinine Clearance: 45.9 mL/min (A) (by C-G formula based on SCr of 1.34 mg/dL (H)).  Sepsis Labs: @LABRCNTIP (procalcitonin:4,lacticidven:4) )No results found for this or any previous visit (from the past 240 hour(s)).   Radiological Exams on Admission: Dg Chest 2 View  Result Date: 08/22/2017 CLINICAL DATA:  Chills.  Fever. EXAM: CHEST  2 VIEW COMPARISON:  Chest x-ray dated June 17, 2016. FINDINGS: Unchanged left chest wall pacer device with leads terminating in the right atrium and right ventricle. Postsurgical changes related to prior CABG and aortic and mitral valve replacements. Stable mild cardiomegaly. Mild pulmonary vascular congestion without overt edema. Trace right pleural effusion. No pneumothorax or consolidation. No acute osseous abnormality. IMPRESSION: Stable cardiomegaly and mild pulmonary vascular congestion. Trace right pleural effusion. Electronically Signed   By: Titus Dubin [name]   On: 08/22/2017 13:39    EKG: Independently reviewed. Junctional tachycardia with left bundle branch block and) block. This and change from previous.  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 UA grossly infected. Sepsis protocol initiated by EDP with numerous fluid boluses and antibiotics. Patient condition much improved subjectively and objectively with improvement in signs. - Continue sepsis protocol - Ceftriaxone - Follow-up blood cultures and urine cultures with tailoring of antibiotics based on sensitivities  Rheumatic heart disease/MVR/AVR/AV block: Extensive cardiac history w/ pacemaker in place. pocTrop 0.12. EKG w/o overt signs of ischemia. Elevated trop likely from demand given sepsis and tachycardia. - Cycle trop - If elevates remarkably then cards consult - continue ASA, Statin  AKI: Cr 1.34. Baseline 0.9.  - IVF - BMP in am  CHF: Last echo showing an EF of 89% and diastolic dysfunction. No evidence of acute decompensation. -Resume lasix when BP stable - I/O, daily weights  COPD: stable - continue Breo-Ellipta, Singulair, spiriva   Chronic pain: - continue Tramadol   DVT prophylaxis: heparin  Code Status: full  Family Communication: noen  Disposition Plan: pending improvemebnt in overall condition after sepsis and trend of troponin  Consults called: none  Admission status: inpt    Shanon Brow  Nada Maclachlan MD [facility]  If 7PM-7AM, please contact night-coverage [email or web address] Password [email or web address]  08/22/2017, 3:28 PM

## 2017-08-22 NOTE — Telephone Encounter (Signed)
Noted. Thank you. I see pt hospitalized with presumed urosepsis

## 2017-08-22 NOTE — ED Notes (Signed)
Delmar Landau, RN made aware of elevated trop.

## 2017-08-22 NOTE — ED Notes (Signed)
ED Provider at bedside. 

## 2017-08-22 NOTE — ED Notes (Signed)
Meal tray at bedside.  

## 2017-08-22 NOTE — ED Notes (Signed)
Dinner tray ordered.

## 2017-08-22 NOTE — ED Provider Notes (Signed)
[facility] DEPARTMENT Provider Note   CSN: [id number] Arrival date & time: 08/22/17  1109     History   Chief Complaint Chief Complaint  Patient presents with  . Fever  . Emesis    HPI [name] is a 71 y.o. female.  HPI   71 year old female with past medical history as below here with generalized fatigue.  The patient states her symptoms started Sunday night.  She states that she began to have urinary frequency and dysuria.  She also notes that her urine started to smell differently.  She awoke on Monday with shaking, severe body chills.  She denies any myalgias.  Since then, she has had severe nausea, weakness, and has had difficulty walking due to feeling lightheaded with standing up.  She has had persistent dysuria and urinary frequency.  She denies any flank pain.  She denies any shortness of breath, but has had some cough.  She denies any known sick contacts.  She did go to church on Sunday, but states that she was not around anyone was sick.  Denies any other abdominal pain.  No diarrhea or change in stool habits.  No alleviating factors.  Past Medical History:  Diagnosis Date  . Acute cystitis without hematuria 12/22/2015  . Anemia   . Arthritis   . CAP (community acquired pneumonia) 02/08/2016  . Cataracts, bilateral    immature  . Chronic diastolic heart failure ([place]) 09/19/2014  . Chronic insomnia   . Constipation    takes Miralax daily as needed  . COPD (chronic obstructive pulmonary disease) (HCC)    Albuterol inhaler daily as needed;Duoneb daily as needed;Spiriva daily  . DDD (degenerative disc disease)    cervical - kyphosis with mod DD changes C5/6 and C6/7; lumbar - early DD at L2/3 ([place])  . Essential hypertension    was on meds but after appointment Dec 11 with Dr.C he took her off  . GERD (gastroesophageal reflux disease)    takes Protonix daily  . History of radiation therapy 05/30/11 to 07/07/11   rectum  . History  of shingles   . Hyperlipemia    takes Lovastatin daily  . Legally blind in left eye, as defined in [place]   . Lung nodule    RLL nodule-24mm stable 2006, April 2009, and June 2009  . Obesity   . Osteopenia 12/2014   T -1.5 hip  . Pancreatitis 11/2014   ?zpack related vs gallstone pancreatitis with abnormal HIDA scan pending cholecystectomy  . Rectal cancer ([place]) 04/2011   T3N0; s/p lap LAR, s/p ileostomy, s/p reversal, s/p chemo  . Rheumatic heart disease mitral stenosis    mod MS by echo 02/2014  . S/P AVR (aortic valve replacement) 2015   bioprosthetic ([place])  . S/P MVR (mitral valve replacement) 2015   bioprosthetic ([place])  . Seizures ([place])    febrile seizures as a child  . Severe aortic valve stenosis  AVR 3/15 10/01/2013   mild-mod by echo 02/2014  . Sleep apnea    PT TOLD BORDERLINE-TRIED CPAP-DID NOT HELP-SHE DOES NOT USE CPAP  . Small bowel obstruction, partial ([place]) 08/2013   reolved without NGT placement.   . Vitamin B12 deficiency 02/28/2015   Start B12 shots 02/2015     Patient Active Problem List   Diagnosis Date Noted  . Pain of right sacroiliac joint 08/09/2017  . 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 Vitamin D deficiency 10/23/2016  . Bilateral hip pain  09/08/2016  . Research study patient 08/26/2016  . Symptomatic bradycardia 06/15/2016  . Personal history of colonic polyps   . Personal history of rectal cancer   . Pedal edema 02/25/2016  . History of pneumonia 02/16/2016  . Skin rash 12/22/2015  . Advanced care planning/counseling discussion 10/28/2015  . CKD (chronic kidney disease) stage 3, GFR 30-59 ml/min (HCC) 07/03/2015  . Dyspnea on exertion 06/05/2015  . Mixed stress and urge urinary incontinence 03/16/2015  . Vitamin B12 deficiency 02/28/2015  . Chronic insomnia 02/25/2015  . Chronic cholecystitis with calculus 01/21/2015  . Osteopenia 12/09/2014  . Medicare annual wellness visit, subsequent 10/15/2014  . Health maintenance  examination 10/15/2014  . Adjustment disorder with depressed mood 10/15/2014  . Chronic diastolic heart failure ([place]) 09/19/2014  . Chest pain 08/25/2014  . 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 Pulmonary nodule 02/23/2012  . History of radiation therapy   . Rectal cancer, midrectum ypT3ypN0, s/p neoadj chemoXRT, lap LAR with diverting loop ileostomy 08/2011, ileostomy takedown 03/2012 05/26/2011  . Essential hypertension, benign 03/24/2009  . Obesity, Class I, BMI 30-34.9 03/05/2008  . BACK PAIN 03/05/2008  . MERALGIA PARESTHETICA 12/13/2007  . GERD (gastroesophageal reflux disease) 08/06/2007  . HLD (hyperlipidemia) 05/03/2007  . COPD (chronic obstructive pulmonary disease) ([place]) 05/03/2007    Past Surgical History:  Procedure Laterality Date  . BACK SURGERY  2006, 2007   2006 SPACER, 2007 decompression and fusion L4/5  . CARPAL TUNNEL RELEASE Bilateral   . CATARACT EXTRACTION W/ INTRAOCULAR LENS IMPLANT Left 10/18/2016  . COLONOSCOPY  03/2013   1 polyp, rpt 3 yrs Ardis Hughs)  . FOOT SURGERY  left foot   hammer toe  . HAND SURGERY  Bil   carpal tunnel  . ILEOSTOMY  08/25/2011  . TONSILLECTOMY      OB History    No data available       Home Medications    Prior to Admission medications   Medication Sig Start Date End Date Taking? Authorizing Provider  albuterol (PROVENTIL HFA;VENTOLIN HFA) 108 (90 Base) MCG/ACT inhaler Inhale 2 puffs into the lungs every 6 (six) hours as needed for wheezing. 02/24/17   Brand Males, MD  amoxicillin (AMOXIL) 500 MG capsule take 4 capsules by mouth 30-60 MINUTES PRIOR TO DENTAL APPT 09/26/16   Croitoru, Mihai, MD  aspirin 81 MG tablet Take 81 mg by mouth at bedtime.     [provider]  BIOTIN PO Take 1 tablet by mouth at bedtime.     [provider]  bisoprolol (ZEBETA) 5 MG tablet take 1/2 tablet by mouth once daily 06/05/17   Croitoru, Mihai, MD  Calcium Carbonate-Vitamin D (CALCIUM 600 + D  PO) Take 600 mg by mouth at bedtime.     [provider]  Cholecalciferol (VITAMIN D) 2000 UNITS CAPS Take 2,000 Units by mouth at bedtime.     [provider]  fluticasone furoate-vilanterol (BREO ELLIPTA) 200-25 MCG/INH AEPB Inhale 1 puff into the lungs daily. 06/06/17   Ria Bush, MD  furosemide (LASIX) 40 MG tablet take 1 tablet by mouth once daily 5 TIMES A WEEK 06/05/17   Croitoru, Mihai, MD  ipratropium-albuterol (DUONEB) 0.5-2.5 (3) MG/3ML SOLN Take 3 mLs by nebulization every 6 (six) hours as needed. Patient taking differently: Take 3 mLs by nebulization every 6 (six) hours as needed (for wheezing or shortness of breath).  11/14/14   Hendricks Limes, MD  lovastatin (MEVACOR) 40 MG tablet take 1  tablet by mouth at bedtime 07/07/17   Ria Bush, MD  methylPREDNISolone (MEDROL DOSEPAK) 4 MG TBPK tablet Use as directed 08/09/17   Ria Bush, MD  Miconazole Nitrate 2 % POWD Apply 1 application topically daily as needed (for skin). Apply to stomach and under breasts    [provider]  montelukast (SINGULAIR) 10 MG tablet take 1 tablet by mouth at bedtime 06/05/17   Brand Males, MD  polycarbophil (FIBERCON) 625 MG tablet Take 625 mg by mouth daily.    [provider]  polyethylene glycol (MIRALAX / GLYCOLAX) packet Take 17 g by mouth daily. 12/02/13   Brett Canales, PA-C  potassium chloride (K-DUR) 10 MEQ tablet take 2 tablets by mouth once daily 06/05/17   Croitoru, Mihai, MD  [facility] HANDIHALER 18 MCG inhalation capsule inhale the contents of one capsule in the handihaler once daily 05/01/17   Brand Males, MD  traMADol (ULTRAM) 50 MG tablet Take 1 tablet (50 mg total) by mouth every 6 (six) hours as needed for moderate pain. 11/14/16   Ria Bush, MD  vitamin B-12 (CYANOCOBALAMIN) 500 MCG tablet Take 500 mcg by mouth daily.    [provider]    Family History Family History  Problem Relation Age of Onset  . Lung  cancer Father   . Heart disease Mother   . Diabetes Mother   . Hypertension Mother   . Stroke Mother   . CAD Son 103  . Heart attack Son 94  . Heart attack Maternal Grandfather   . Breast cancer Maternal Aunt 83  . Colon cancer Neg Hx   . Esophageal cancer Neg Hx   . Stomach cancer Neg Hx   . Rectal cancer Neg Hx     Social History Social History   Tobacco Use  . Smoking status: Former Smoker    Packs/day: 1.50    Years: 10.00    Pack years: 15.00    Types: Cigarettes    Last attempt to quit: 12/28/1974    Years since quitting: 42.6  . Smokeless tobacco: Never Used  . Tobacco comment: quit smoking in 1976  Substance Use Topics  . Alcohol use: Yes    Alcohol/week: 0.0 oz    Comment: rarely wine  . Drug use: No     Allergies   Codeine; Prednisone; and Sulfonamide derivatives   Review of Systems Review of Systems  Constitutional: Positive for chills, fatigue and fever.  Gastrointestinal: Positive for nausea and vomiting.  Genitourinary: Positive for dysuria and frequency.  Neurological: Positive for weakness.  All other systems reviewed and are negative.    Physical Exam Updated Vital Signs BP (!) 104/54   Pulse 98   Temp 100.3 F (37.9 C) (Oral)   Resp 18   SpO2 96%   Physical Exam  Constitutional: She is oriented to person, place, and time. She appears well-developed and well-nourished. No distress.  HENT:  Head: Normocephalic and atraumatic.  Mildly dry mucous membranes  Eyes: Conjunctivae are normal.  Neck: Neck supple.  Cardiovascular: Normal rate, regular rhythm and normal heart sounds. Exam reveals no friction rub.  No murmur heard. Pulmonary/Chest: Effort normal and breath sounds normal. No respiratory distress. She has no wheezes. She has no rales.  Abdominal: She exhibits no distension.  No CVA tenderness bilaterally.  Mild suprapubic tenderness.  No rebound or guarding.  Musculoskeletal: She exhibits no edema.  Neurological: She is alert  and oriented to person, place, and time. She exhibits normal muscle tone.  Skin: Skin is warm. Capillary refill takes less than 2 seconds.  Psychiatric: She has a normal mood and affect.  Nursing note and vitals reviewed.    ED Treatments / Results  Labs (all labs ordered are listed, but only abnormal results are displayed) Labs Reviewed  COMPREHENSIVE METABOLIC PANEL - Abnormal; Notable for the following components:      Result Value   Potassium 3.3 (*)    Glucose, Bld 116 (*)    Creatinine, Ser 1.34 (*)    Calcium 8.6 (*)    Total Protein 6.0 (*)    Albumin 2.9 (*)    ALT 63 (*)    Total Bilirubin 1.4 (*)    GFR calc non Af Amer 39 (*)    GFR calc Af Amer 45 (*)    All other components within normal limits  CBC WITH DIFFERENTIAL/PLATELET - Abnormal; Notable for the following components:   Platelets 129 (*)    All other components within normal limits  URINALYSIS, ROUTINE W REFLEX MICROSCOPIC - Abnormal; Notable for the following components:   Color, Urine AMBER (*)    APPearance TURBID (*)    Hgb urine dipstick MODERATE (*)    Ketones, ur 5 (*)    Protein, ur 100 (*)    Nitrite POSITIVE (*)    Leukocytes, UA LARGE (*)    Bacteria, UA MANY (*)    All other components within normal limits  I-STAT CG4 LACTIC ACID, ED - Abnormal; Notable for the following components:   Lactic Acid, Venous 2.09 (*)    All other components within normal limits  I-STAT TROPONIN, ED - Abnormal; Notable for the following components:   Troponin i, poc 0.12 (*)    All other components within normal limits  CULTURE, BLOOD (ROUTINE X 2)  CULTURE, BLOOD (ROUTINE X 2)  URINE CULTURE  I-STAT CG4 LACTIC ACID, ED    EKG  EKG Interpretation  Date/Time:  Tuesday August 22 2017 11:27:02 EST Ventricular Rate:  133 PR Interval:    QRS Duration: 158 QT Interval:  369 QTC Calculation: 549 R Axis:   154 Text Interpretation:  Junctional tachycardia RBBB and LPFB Since last EKG, rate has  significantly increased Confirmed by Duffy Bruce [phone]) on 08/22/2017 11:30:48 AM Also confirmed by Duffy Bruce [phone]), editor Philomena Doheny [phone])  on 08/22/2017 12:34:27 PM       Radiology Dg Chest 2 View  Result Date: 08/22/2017 CLINICAL DATA:  Chills.  Fever. EXAM: CHEST  2 VIEW COMPARISON:  Chest x-ray dated June 17, 2016. FINDINGS: Unchanged left chest wall pacer device with leads terminating in the right atrium and right ventricle. Postsurgical changes related to prior CABG and aortic and mitral valve replacements. Stable mild cardiomegaly. Mild pulmonary vascular congestion without overt edema. Trace right pleural effusion. No pneumothorax or consolidation. No acute osseous abnormality. IMPRESSION: Stable cardiomegaly and mild pulmonary vascular congestion. Trace right pleural effusion. Electronically Signed   By: Titus Dubin [name]   On: 08/22/2017 13:39    Procedures .Critical Care Performed by: Duffy Bruce, MD Authorized by: Duffy Bruce, MD   Critical care provider statement:    Critical care time (minutes):  35   Critical care time was exclusive of:  Separately billable procedures and treating other patients and teaching time   Critical care was necessary to treat or prevent imminent or life-threatening deterioration of the following conditions:  Circulatory failure, sepsis, dehydration and cardiac failure   Critical care was time spent personally by me  on the following activities:  Development of treatment plan with patient or surrogate, discussions with consultants, evaluation of patient's response to treatment, examination of patient, obtaining history from patient or surrogate, ordering and performing treatments and interventions, ordering and review of laboratory studies, ordering and review of radiographic studies, pulse oximetry, re-evaluation of patient's condition and review of old charts   I assumed direction of critical care for this patient from  another provider in my specialty: no     (including critical care time)  Medications Ordered in ED Medications  potassium chloride SA (K-DUR,KLOR-CON) CR tablet 40 mEq (not administered)  cefTRIAXone (ROCEPHIN) 2 g in dextrose 5 % 50 mL IVPB (2 g Intravenous New Bag/Given 08/22/17 1159)  sodium chloride 0.9 % bolus 1,000 mL (1,000 mLs Intravenous New Bag/Given 08/22/17 1148)  sodium chloride 0.9 % bolus 1,000 mL (1,000 mLs Intravenous New Bag/Given 08/22/17 1210)  sodium chloride 0.9 % bolus 1,000 mL (1,000 mLs Intravenous New Bag/Given 08/22/17 1148)     Initial Impression / Assessment and Plan / ED Course  I have reviewed the triage vital signs and the nursing notes.  Pertinent labs & imaging results that were available during my care of the patient were reviewed by me and considered in my medical decision making (see chart for details).     71 year old female with past medical history as above here with generalized weakness and urinary frequency and urgency.  On arrival, the patient is febrile, tachycardic, and hypotensive.  Code sepsis initiated with Rocephin for suspected UTI.  30 cc/kg fluid ordered.  Of note, patient is already received 1 L prior to arrival, via EMS.  Tylenol given by EMS.  Patient mentating well, nontoxic on exam.  Abdomen is otherwise soft.  Lab work is consistent with sepsis secondary to UTI.  Patient also has suspected demand ischemia.  She has no chest pain and no signs of ST elevation.  On repeat assessment, heart rate is now in the 80s and blood pressure 100s over 60s.  She feels improved.  Patient has been given Rocephin and multiple fluid boluses.  Will admit to medicine.   This note was prepared with assistance of Systems analyst. Occasional wrong-word or sound-a-like substitutions may have occurred due to the inherent limitations of voice recognition software.  Final Clinical Impressions(s) / ED Diagnoses   Final diagnoses:  Sepsis  due to urinary tract infection ([place])  Elevated troponin    ED Discharge Orders    None       Duffy Bruce, MD 08/22/17 1349

## 2017-08-22 NOTE — ED Notes (Signed)
DR. Myrene Buddy made aware of oral temp of 100.3 advised rectal temp is not needed at this time

## 2017-08-22 NOTE — ED Notes (Signed)
Troponin did not result. Called down to main lab and was told nothing had been sent. Called mini lab and ask Kayla to pull a repeat Troponin I. Will continue to monitor.

## 2017-08-22 NOTE — ED Triage Notes (Signed)
Pt arrives via [facility] from home with c/o fever n/v since late Sunday. Temp 101 with ems, given 1L ns and 1g tylenol. Pt also reports painful urination. A/ox4, resp e/u, nad.

## 2017-08-22 NOTE — Telephone Encounter (Signed)
C/O chills, nausea and burning with urination and severe weakness since Sunday morning around 1:00am.   "I'm so weak I can't drive"  " There is no one that can take me.   "I'm here alone"    "There is no way I can drive"    She agreed to calling 911 and having the ambulance take her to [facility] ED  (Her preference) after putting on her clothes. Reason for Disposition . Patient sounds very sick or weak to the triager  Answer Assessment - Initial Assessment Questions 1. SYMPTOM: "What's the main symptom you're concerned about?" (e.g., frequency, incontinence)     So weak, chills, nausea and heaving, can't eat.  Drinking water.   2. ONSET: "When did the  ________  start?"     Sunday night 1am 3. PAIN: "Is there any pain?" If so, ask: "How bad is it?" (Scale: 1-10; mild, moderate, severe)     I'm so cold I'm under 2 blankets and my heat is on 80.  Thermometer is broken.    Burning with urination.  Darker.   Took AZO but not better 4. CAUSE: "What do you think is causing the symptoms?"     UTI   I've had them before 5. OTHER SYMPTOMS: "Do you have any other symptoms?" (e.g., fever, flank pain, blood in urine, pain with urination)     Fever, chills,  No abd pain or back pain.   6. PREGNANCY: "Is there any chance you are pregnant?" "When was your last menstrual period?"     N/A  Protocols used: URINARY [facility]

## 2017-08-23 ENCOUNTER — Other Ambulatory Visit: Payer: Self-pay

## 2017-08-23 DIAGNOSIS — I10 Essential (primary) hypertension: Secondary | ICD-10-CM

## 2017-08-23 DIAGNOSIS — R001 Bradycardia, unspecified: Secondary | ICD-10-CM

## 2017-08-23 DIAGNOSIS — R748 Abnormal levels of other serum enzymes: Secondary | ICD-10-CM

## 2017-08-23 DIAGNOSIS — I5032 Chronic diastolic (congestive) heart failure: Secondary | ICD-10-CM

## 2017-08-23 DIAGNOSIS — N390 Urinary tract infection, site not specified: Secondary | ICD-10-CM

## 2017-08-23 DIAGNOSIS — I441 Atrioventricular block, second degree: Secondary | ICD-10-CM

## 2017-08-23 DIAGNOSIS — J449 Chronic obstructive pulmonary disease, unspecified: Secondary | ICD-10-CM

## 2017-08-23 DIAGNOSIS — Z952 Presence of prosthetic heart valve: Secondary | ICD-10-CM

## 2017-08-23 DIAGNOSIS — C20 Malignant neoplasm of rectum: Secondary | ICD-10-CM

## 2017-08-23 DIAGNOSIS — A419 Sepsis, unspecified organism: Principal | ICD-10-CM

## 2017-08-23 DIAGNOSIS — N183 Chronic kidney disease, stage 3 (moderate): Secondary | ICD-10-CM

## 2017-08-23 LAB — COMPREHENSIVE METABOLIC PANEL
ALT: 45 U/L (ref 14–54)
AST: 23 U/L (ref 15–41)
Albumin: 2.5 g/dL — ABNORMAL LOW (ref 3.5–5.0)
Alkaline Phosphatase: 91 U/L (ref 38–126)
Anion gap: 6 (ref 5–15)
BUN: 16 mg/dL (ref 6–20)
CO2: 22 mmol/L (ref 22–32)
Calcium: 8.4 mg/dL — ABNORMAL LOW (ref 8.9–10.3)
Chloride: 108 mmol/L (ref 101–111)
Creatinine, Ser: 0.99 mg/dL (ref 0.44–1.00)
GFR calc Af Amer: >60 mL/min (ref >60)
GFR calc non Af Amer: 56 mL/min — ABNORMAL LOW (ref >60)
Glucose, Bld: 106 mg/dL — ABNORMAL HIGH (ref 65–99)
Potassium: 3.9 mmol/L (ref 3.5–5.1)
Sodium: 136 mmol/L (ref 135–145)
Total Bilirubin: 0.7 mg/dL (ref 0.3–1.2)
Total Protein: 5.3 g/dL — ABNORMAL LOW (ref 6.5–8.1)

## 2017-08-23 LAB — TROPONIN I
Troponin I: 0.18 ng/mL (ref <0.03)
Troponin I: 0.15 ng/mL (ref <0.03)

## 2017-08-23 LAB — PROCALCITONIN: Procalcitonin: 0.16 ng/mL

## 2017-08-23 LAB — MRSA PCR SCREENING: MRSA by PCR: NEGATIVE

## 2017-08-23 LAB — CBC
HCT: 37.9 % (ref 36.0–46.0)
Hemoglobin: 11.9 g/dL — ABNORMAL LOW (ref 12.0–15.0)
MCH: 30.1 pg (ref 26.0–34.0)
MCHC: 31.4 g/dL (ref 30.0–36.0)
MCV: 95.7 fL (ref 78.0–100.0)
Platelets: 89 10*3/uL — ABNORMAL LOW (ref 150–400)
RBC: 3.96 MIL/uL (ref 3.87–5.11)
RDW: 15 % (ref 11.5–15.5)
WBC: 5.9 10*3/uL (ref 4.0–10.5)

## 2017-08-23 MED ORDER — CIPROFLOXACIN HCL 500 MG PO TABS: 500.0000 mg | ORAL_TABLET | Freq: Two times a day (BID) | ORAL | Status: DC

## 2017-08-23 MED ORDER — CIPROFLOXACIN IN D5W 400 MG/200ML IV SOLN
400.0000 mg | Freq: Two times a day (BID) | INTRAVENOUS | Status: DC
  Administered 2017-08-23 – 2017-08-24 (×2): 400 mg via INTRAVENOUS
  Filled 2017-08-23 (×2): qty 200

## 2017-08-23 MED ORDER — NYSTATIN 100000 UNIT/GM EX POWD
Freq: Three times a day (TID) | CUTANEOUS | Status: DC
  Administered 2017-08-23 – 2017-08-24 (×4): via TOPICAL
  Filled 2017-08-23: qty 15

## 2017-08-23 MED ORDER — ONDANSETRON HCL 4 MG/2ML IJ SOLN
4.0000 mg | Freq: Four times a day (QID) | INTRAMUSCULAR | Status: DC | PRN
  Administered 2017-08-23 – 2017-08-24 (×2): 4 mg via INTRAVENOUS
  Filled 2017-08-23 (×2): qty 2

## 2017-08-23 NOTE — Evaluation (Signed)
Physical Therapy Evaluation Patient Details Name: [name] MRN: [id number] DOB: January 11, 1946 Today's Date: 08/23/2017   History of Present Illness  Pt is a 71 y/o female admitted secondary to fever and emesis. Pt found to be septic secondary to a UTI. PMH including but not limited to CHF, COPD, rectal cancer s/p ileostomy/reversal/chemo, s/p AVR and MVR on 2015.  Clinical Impression  Pt presented supine in bed with HOB elevated, awake and willing to participate in therapy session. Prior to admission, pt reported that she was independent with all functional mobility and ADLs. Pt's son present throughout session. Pt ambulated in hallway without use of an AD (refusing at this time) with close min guard and reaching for external supports. Pt would continue to benefit from skilled physical therapy services at this time while admitted and after d/c to address the below listed limitations in order to improve overall safety and independence with functional mobility.  Of note, pt ambulated on RA with SPO2 decreasing to 84% with slow recovery to 90% with sitting rest break and pursed-lip breathing.     Follow Up Recommendations Home health PT;Supervision - Intermittent    Equipment Recommendations  None recommended by PT;Other (comment)(pt refusing any AD at this time)    Recommendations for Other Services       Precautions / Restrictions Precautions Precautions: None Restrictions Weight Bearing Restrictions: No      Mobility  Bed Mobility Overal bed mobility: Modified Independent             General bed mobility comments: increased time  Transfers Overall transfer level: Needs assistance Equipment used: None Transfers: Sit to/from Stand Sit to Stand: Supervision         General transfer comment: supervision for safety  Ambulation/Gait Ambulation/Gait assistance: Min guard Ambulation Distance (Feet): 150 Feet Assistive device: None(occasionally reaching for hand  rails in hallway) Gait Pattern/deviations: Step-through pattern;Decreased stride length;Shuffle;Drifts right/left Gait velocity: decreased Gait velocity interpretation: Below normal speed for age/gender General Gait Details: mild instability but no overt LOB or need for physical assistance; pt refusing to use an AD but occasionally reaching for support surfaces  Stairs            Wheelchair Mobility    Modified Rankin (Stroke Patients Only)       Balance Overall balance assessment: Needs assistance Sitting-balance support: No upper extremity supported;Feet supported Sitting balance-Leahy Scale: Good     Standing balance support: No upper extremity supported;During functional activity Standing balance-Leahy Scale: Fair                               Pertinent Vitals/Pain Pain Assessment: No/denies pain    Home Living Family/patient expects to be discharged to:: Private residence Living Arrangements: Alone Available Help at Discharge: Family;Friend(s);Available PRN/intermittently Type of Home: House Home Access: Stairs to enter Entrance Stairs-Rails: Right Entrance Stairs-Number of Steps: 3 Home Layout: One level Home Equipment: Grab bars - tub/shower;Shower seat      Prior Function Level of Independence: Independent               Hand Dominance        Extremity/Trunk Assessment   Upper Extremity Assessment Upper Extremity Assessment: Overall WFL for tasks assessed    Lower Extremity Assessment Lower Extremity Assessment: Generalized weakness       Communication   Communication: No difficulties  Cognition Arousal/Alertness: Awake/alert Behavior During Therapy: WFL for tasks assessed/performed Overall Cognitive Status:  Within Functional Limits for tasks assessed                                        General Comments General comments (skin integrity, edema, etc.): Pt ambulated on RA with SPO2 decreasing to 84% with  slow recovery to 90% with sitting rest break and pursed lip breathing    Exercises     Assessment/Plan    PT Assessment Patient needs continued PT services  PT Problem List Decreased strength;Decreased activity tolerance;Decreased mobility;Decreased balance;Decreased coordination;Decreased safety awareness;Cardiopulmonary status limiting activity       PT Treatment Interventions DME instruction;Gait training;Stair training;Functional mobility training;Therapeutic activities;Therapeutic exercise;Balance training;Neuromuscular re-education;Patient/family education    PT Goals (Current goals can be found in the Care Plan section)  Acute Rehab PT Goals Patient Stated Goal: return home PT Goal Formulation: With patient/family Time For Goal Achievement: 09/06/17 Potential to Achieve Goals: Good    Frequency Min 3X/week   Barriers to discharge        Co-evaluation               AM-PAC PT "6 Clicks" Daily Activity  Outcome Measure Difficulty turning over in bed (including adjusting bedclothes, sheets and blankets)?: None Difficulty moving from lying on back to sitting on the side of the bed? : None Difficulty sitting down on and standing up from a chair with arms (e.g., wheelchair, bedside commode, etc,.)?: None Help needed moving to and from a bed to chair (including a wheelchair)?: None Help needed walking in hospital room?: A Little Help needed climbing 3-5 steps with a railing? : A Little 6 Click Score: 22    End of Session   Activity Tolerance: Patient limited by fatigue Patient left: in bed;with call bell/phone within reach;with nursing/sitter in room Nurse Communication: Mobility status PT Visit Diagnosis: Other abnormalities of gait and mobility (R26.89)    Time: 5277-8242 PT Time Calculation (min) (ACUTE ONLY): 13 min   Charges:   PT Evaluation $PT Eval Moderate Complexity: 1 Mod     PT G Codes:        [place], PT, DPT [place] 08/23/2017, 10:42 AM

## 2017-08-23 NOTE — Progress Notes (Signed)
Notified by lab of troponin level 0.15. Will notify MD

## 2017-08-23 NOTE — Progress Notes (Signed)
PROGRESS NOTE  [name] XTK:[id number] DOB: [birth year] DOA: 08/22/2017 PCP: Diane Bush, MD  HPI/Recap of past 24 hours: [name] 71 yo F PMH SBO, pancreatitis, rectal cancer, MVR and more presented with nausea vomiting and fever. Urosepsis. Elevated trops peaked ay 0.18. cxr reviewed by self mild increased pulmonary vascularity with trace right pulmonary effusion.  This afternoon patient complaints of nausea intermittent without vomiting. Denies abdominal pain or flank pain.   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 Elevated trop likely from demand given sepsis and tachycardia. - Cycle trop - If elevates remarkably then cards consult - continue ASA, Statin  AKI:  -resolved -Cr 0.99 from 1.34. Baseline 0.9.  - IVF - BMP in am  CHF:  -Last echo showing an EF of 99% and diastolic dysfunction.  -No evidence of acute decompensation. -Resume lasix when BP stable - I/O, daily  weights  COPD:  -stable - continue Breo-Ellipta, Singulair, spiriva   Chronic pain: -Tramadol   Family Communication: No family present at bedside  Disposition Plan: Will stay another midnight due to persistent nausea and to continue IV antibiotic.    Consultants:  None  Procedures:  None  Antimicrobials:  IV ciprofloxacin  DVT prophylaxis:  lovenox sq 40 mg daily   Objective: Vitals:   08/22/17 2330 08/23/17 0035 08/23/17 0606 08/23/17 0751  BP: (!) 129/52 (!) 121/57 (!) 123/51   Pulse: 82 86 79 73  Resp: 18 18 18 14   Temp:  98.6 F (37 C) 97.8 F (36.6 C)   TempSrc:  Oral Oral   SpO2: 93% 94% 96% 93%  Weight:  100.2 kg (221 lb)    Height:  5\' 6"  (1.676 m)      Intake/Output Summary (Last 24 hours) at 08/23/2017 0833 Last data filed at 08/23/2017 0603 Gross per 24 hour  Intake 1240 ml  Output 300 ml  Net 940 ml   Filed Weights   08/23/17 0035  Weight: 100.2 kg (221 lb)    Exam:   General:  71 yo CF WD WN in NAD  Cardiovascular: RRR with no rubs or gallops  Respiratory: Mild crackles at bases bilaterally. No wheezes   Abdomen: obese  Soft NT ND NBS x 4 quadrants  Musculoskeletal: Moves all limbs freely   Skin: No open lesions  Psychiatry: Mood appropriate for condition and setting.   Data Reviewed: CBC: Recent Labs  Lab 08/22/17 1158  WBC 8.7  NEUTROABS 7.4  HGB 13.8  HCT 42.4  MCV 95.5  PLT 242*   Basic Metabolic Panel: Recent  Labs  Lab 08/22/17 1158  NA 137  K 3.3*  CL 106  CO2 22  GLUCOSE 116*  BUN 18  CREATININE 1.34*  CALCIUM 8.6*   GFR: Estimated Creatinine Clearance: 45.4 mL/min (A) (by C-G formula based on SCr of 1.34 mg/dL (H)). Liver Function Tests: Recent Labs  Lab 08/22/17 1158  AST 26  ALT 63*  ALKPHOS 116  BILITOT 1.4*  PROT 6.0*  ALBUMIN 2.9*   No results for input(s): LIPASE, AMYLASE in the last 168 hours. No results for input(s): AMMONIA in the last 168 hours. Coagulation  Profile: No results for input(s): INR, PROTIME in the last 168 hours. Cardiac Enzymes: Recent Labs  Lab 08/22/17 2045 08/23/17 0324  TROPONINI 0.18* 0.18*   BNP (last 3 results) No results for input(s): PROBNP in the last 8760 hours. HbA1C: No results for input(s): HGBA1C in the last 72 hours. CBG: No results for input(s): GLUCAP in the last 168 hours. Lipid Profile: No results for input(s): CHOL, HDL, LDLCALC, TRIG, CHOLHDL, LDLDIRECT in the last 72 hours. Thyroid Function Tests: No results for input(s): TSH, T4TOTAL, FREET4, T3FREE, THYROIDAB in the last 72 hours. Anemia Panel: No results for input(s): VITAMINB12, FOLATE, FERRITIN, TIBC, IRON, RETICCTPCT in the last 72 hours. Urine analysis:    Component Value Date/Time   COLORURINE AMBER (A) 08/22/2017 1229   APPEARANCEUR TURBID (A) 08/22/2017 1229   LABSPEC 1.013 08/22/2017 1229   PHURINE 6.0 08/22/2017 1229   GLUCOSEU NEGATIVE 08/22/2017 1229   HGBUR MODERATE (A) 08/22/2017 1229   BILIRUBINUR NEGATIVE 08/22/2017 1229   BILIRUBINUR Negative 12/22/2015 1628   KETONESUR 5 (A) 08/22/2017 1229   PROTEINUR 100 (A) 08/22/2017 1229   UROBILINOGEN 1.0 12/22/2015 1628   UROBILINOGEN 0.2 12/06/2013 1356   NITRITE POSITIVE (A) 08/22/2017 1229   LEUKOCYTESUR LARGE (A) 08/22/2017 1229   Sepsis Labs: @LABRCNTIP (procalcitonin:4,lacticidven:4)  )No results found for this or any previous visit (from the past 240 hour(s)).    Studies: Dg Chest 2 View  Result Date: 08/22/2017 CLINICAL DATA:  Chills.  Fever. EXAM: CHEST  2 VIEW COMPARISON:  Chest x-ray dated June 17, 2016. FINDINGS: Unchanged left chest wall pacer device with leads terminating in the right atrium and right ventricle. Postsurgical changes related to prior CABG and aortic and mitral valve replacements. Stable mild cardiomegaly. Mild pulmonary vascular congestion without overt edema. Trace right pleural effusion. No pneumothorax or consolidation. No acute osseous  abnormality. IMPRESSION: Stable cardiomegaly and mild pulmonary vascular congestion. Trace right pleural effusion. Electronically Signed   By: Titus [name] [name]   On: 08/22/2017 13:39    Scheduled Meds: . aspirin  81 mg Oral QHS  . montelukast  10 mg Oral QHS  . polyethylene glycol  17 g Oral Daily  . potassium chloride  20 mEq Oral Daily  . pravastatin  40 mg Oral q1800  . tiotropium  18 mcg Inhalation Daily    Continuous Infusions:   LOS: 1 day     Kayleen Memos, MD [facility] Pager [phone]  If 7PM-7AM, please contact night-coverage [email or web address] Password [facility] 08/23/2017, 8:33 AM

## 2017-08-24 ENCOUNTER — Encounter (HOSPITAL_COMMUNITY): Payer: Medicare Other

## 2017-08-24 LAB — URINE CULTURE: Culture: >=100000 — AB

## 2017-08-24 LAB — CBC
HCT: 38.1 % (ref 36.0–46.0)
Hemoglobin: 12.2 g/dL (ref 12.0–15.0)
MCH: 30.6 pg (ref 26.0–34.0)
MCHC: 32 g/dL (ref 30.0–36.0)
MCV: 95.5 fL (ref 78.0–100.0)
Platelets: 87 10*3/uL — ABNORMAL LOW (ref 150–400)
RBC: 3.99 MIL/uL (ref 3.87–5.11)
RDW: 14.8 % (ref 11.5–15.5)
WBC: 9.8 10*3/uL (ref 4.0–10.5)

## 2017-08-24 LAB — BASIC METABOLIC PANEL
Anion gap: 6 (ref 5–15)
BUN: 12 mg/dL (ref 6–20)
CO2: 24 mmol/L (ref 22–32)
Calcium: 8.5 mg/dL — ABNORMAL LOW (ref 8.9–10.3)
Chloride: 104 mmol/L (ref 101–111)
Creatinine, Ser: 0.75 mg/dL (ref 0.44–1.00)
GFR calc Af Amer: >60 mL/min (ref >60)
GFR calc non Af Amer: >60 mL/min (ref >60)
Glucose, Bld: 116 mg/dL — ABNORMAL HIGH (ref 65–99)
Potassium: 3.9 mmol/L (ref 3.5–5.1)
Sodium: 134 mmol/L — ABNORMAL LOW (ref 135–145)

## 2017-08-24 MED ORDER — KETOROLAC TROMETHAMINE 15 MG/ML IJ SOLN
7.5000 mg | Freq: Once | INTRAMUSCULAR | Status: AC
  Administered 2017-08-24: 7.5 mg via INTRAVENOUS
  Filled 2017-08-24: qty 1

## 2017-08-24 MED ORDER — METOCLOPRAMIDE HCL 5 MG/ML IJ SOLN
5.0000 mg | Freq: Once | INTRAMUSCULAR | Status: AC
  Administered 2017-08-24: 5 mg via INTRAVENOUS
  Filled 2017-08-24: qty 2

## 2017-08-24 MED ORDER — DIPHENHYDRAMINE HCL 50 MG/ML IJ SOLN
12.5000 mg | Freq: Once | INTRAMUSCULAR | Status: AC
  Administered 2017-08-24: 12.5 mg via INTRAVENOUS
  Filled 2017-08-24: qty 1

## 2017-08-24 MED ORDER — CEPHALEXIN 500 MG PO CAPS: 500.0000 mg | ORAL_CAPSULE | Freq: Two times a day (BID) | ORAL | 0 refills | Status: AC

## 2017-08-24 MED ORDER — CEPHALEXIN 500 MG PO CAPS
500.0000 mg | ORAL_CAPSULE | Freq: Two times a day (BID) | ORAL | Status: DC
  Administered 2017-08-24: 500 mg via ORAL
  Filled 2017-08-24 (×2): qty 1

## 2017-08-24 NOTE — Discharge Instructions (Signed)

## 2017-08-24 NOTE — Progress Notes (Signed)
Physical Therapy Treatment Patient Details Name: [name] MRN: [id number] DOB: Nov 27, 1945 Today's Date: 08/24/2017    History of Present Illness Pt is a 71 y/o female admitted secondary to fever and emesis. Pt found to be septic secondary to a UTI. PMH including but not limited to CHF, COPD, rectal cancer s/p ileostomy/reversal/chemo, s/p AVR and MVR on 2015.    PT Comments    Pt in bed upon arrival.Agreeable to get OOB. Pt mod I with bed mobility; increased time. Supervision with transfers for safety. HR 95 bpm baseline before ambulating. Pt pushed IV pole first half of walk then walked without it the last half. Pt steady with IV pole, slight wobbles without AD. Pt does not want to use a RW as she doesn't want to be dependent on anything. VCs for reciprocal arm swing and head position. HR 122 bpm; after standing rest break HR 112 bpm. O2 sat > 90% ambulating RA. Performed seated exercises in chair upon return. Able to do all exercises with cues for decreased speed.   Follow Up Recommendations  Home health PT;Supervision - Intermittent     Equipment Recommendations  None recommended by PT;Other (comment)    Recommendations for Other Services       Precautions / Restrictions Precautions Precautions: None Restrictions Weight Bearing Restrictions: No    Mobility  Bed Mobility Overal bed mobility: Modified Independent             General bed mobility comments: increased time  Transfers Overall transfer level: Needs assistance Equipment used: None Transfers: Sit to/from Stand Sit to Stand: Supervision         General transfer comment: supervision for safety  Ambulation/Gait Ambulation/Gait assistance: Min guard Ambulation Distance (Feet): 300 Feet Assistive device: None(pushed IV pole) Gait Pattern/deviations: Step-through pattern;Decreased stride length;Shuffle Gait velocity: decreased   General Gait Details: No LOB though slightly unsteady at times. HR  122 bpm while ambulating; standing rest break with pursed-lip breathing decreased to 112 bpm. O2 sats > 90% RA while ambulating. Pt pushed IV first half of walk then ambulated without it. VCs to relax arms and allow them to swing and for neutral head position.   Stairs            Wheelchair Mobility    Modified Rankin (Stroke Patients Only)       Balance Overall balance assessment: Needs assistance Sitting-balance support: No upper extremity supported;Feet supported Sitting balance-Leahy Scale: Good     Standing balance support: No upper extremity supported;During functional activity Standing balance-Leahy Scale: Fair Standing balance comment: Able to static stand with no wobbles; when walking slight wobbling at times.                            Cognition Arousal/Alertness: Awake/alert Behavior During Therapy: WFL for tasks assessed/performed Overall Cognitive Status: Within Functional Limits for tasks assessed                                        Exercises Total Joint Exercises Towel Squeeze: AROM;Both;10 reps;Supine General Exercises - Lower Extremity Ankle Circles/Pumps: AROM;Both;20 reps;Supine Quad Sets: AROM;Both;15 reps;Supine Long Arc Quad: AROM;Both;10 reps;Seated Hip ABduction/ADduction: AROM;Both;10 reps;Supine Straight Leg Raises: AROM;Both;5 reps(long sitting) Hip Flexion/Marching: AROM;Both;Seated(30 secs)    General Comments        Pertinent Vitals/Pain      Home Living  Prior Function            PT Goals (current goals can now be found in the care plan section) Acute Rehab PT Goals Patient Stated Goal: return home PT Goal Formulation: With patient Time For Goal Achievement: 09/06/17 Potential to Achieve Goals: Good Progress towards PT goals: Progressing toward goals    Frequency    Min 3X/week      PT Plan Current plan remains appropriate    Co-evaluation               AM-PAC PT "6 Clicks" Daily Activity  Outcome Measure  Difficulty turning over in bed (including adjusting bedclothes, sheets and blankets)?: None Difficulty moving from lying on back to sitting on the side of the bed? : None Difficulty sitting down on and standing up from a chair with arms (e.g., wheelchair, bedside commode, etc,.)?: None Help needed moving to and from a bed to chair (including a wheelchair)?: None Help needed walking in hospital room?: A Little Help needed climbing 3-5 steps with a railing? : A Little 6 Click Score: 22    End of Session Equipment Utilized During Treatment: Gait belt Activity Tolerance: Patient limited by fatigue Patient left: in chair;with call bell/phone within reach Nurse Communication: Mobility status PT Visit Diagnosis: Other abnormalities of gait and mobility (R26.89)     Time: 8366-2947 PT Time Calculation (min) (ACUTE ONLY): 24 min  Charges:  $Gait Training: 8-22 mins $Therapeutic Exercise: 8-22 mins                    G Codes:       Janna Arch, SPTA   Janna Arch 08/24/2017, 2:01 PM

## 2017-08-24 NOTE — Care Management Note (Signed)
Case Management Note  Patient Details  Name: NATALYA DOMZALSKI MRN: [id number] Date of Birth: May 02, 1946  Subjective/Objective:    Sepsis               Action/Plan: Patient discharging home today; [place] choice offered, patient chose [place]; Butch Penny with [facility] called for arrangements.  Expected Discharge Date:  08/24/17               Expected Discharge Plan:  [place]  Discharge planning Services  CM Consult  Choice offered to:  Patient  HH Arranged:  PT [facility] Agency:  [place]  Status of Service:  In process, will continue to follow  Sherrilyn Rist [phone] 08/24/2017, 1:32 PM

## 2017-08-24 NOTE — Discharge Summary (Addendum)
Discharge Summary  [name] XBM:[id number] DOB: 1946-01-15  PCP: Ria Bush, MD  Admit date: 08/22/2017 Discharge date: 08/24/2017  Time spent: 25 minutes  Recommendations for Outpatient Follow-up:  1. Follow up with PCP within a week 2. Take your medications as prescribed 3. Continue PT with home health  Discharge Diagnoses:  Active Hospital Problems   Diagnosis Date Noted  . Sepsis secondary to UTI ([place]) 08/22/2017  . 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 Symptomatic bradycardia 06/15/2016  . CKD (chronic kidney disease) stage 3, GFR 30-59 ml/min (HCC) 07/03/2015  . Chronic diastolic heart failure ([place]) 09/19/2014  . 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 Rectal cancer, midrectum ypT3ypN0, s/p neoadj chemoXRT, lap LAR with diverting loop ileostomy 08/2011, ileostomy takedown 03/2012 05/26/2011  . Essential hypertension, benign 03/24/2009  . COPD (chronic obstructive pulmonary disease) ([place]) 05/03/2007    Resolved Hospital Problems  No resolved problems to display.    Discharge Condition: stable   Diet recommendation: resume previous diet  Vitals:   08/24/17 0821 08/24/17 1152  BP:  (!) 116/52  Pulse: 83 87  Resp: 16 18  Temp:  98.5 F (36.9 C)  SpO2: 93% 91%    History of present illness:  [name] is a 71 y.o. female with medical history significant of small bowel obstructions, MVR and aVR in 2015, seizures, rheumatic heart disease, rectal cancer, pancreatitis, legal blindness, hyperlipidemia, GERD, hypertension, COPD, cardiovascular congestive heart failure, anemia.  Patient presenting with three-day history of progressive generalized fatigue with intermittent fevers. Associated with urinary frequency and dysuria and foul-smelling urine. On the day prior to admission patient woke up with body aches and chills. Lightheaded with ambulation. Patient is not taken anything for her symptoms.  ED Course:  Sepsis protocol initiated.  Urine culture positive for e-coli sensitive to keflex. Keflex 500 mg po BID x 5 days.  Patient advised to stay hydrated and to continue PT with Home Health.    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 Elevated trop likely from demand given sepsis and tachycardia. - Cycle trop - If elevates remarkably then cards consult - continue ASA, Statin  AKI:  -resolved -Cr 0.7 from 0.99 from 1.34. - stay hydrated - avoid nephrotoxic medications NSAIDS  CHF: -Last echo showing an EF of 60%- 65% 0/27/25 and diastolic dysfunction.  -No evidence of acute decompensation. -Resume lasix when BP stable -I/O, daily weights  COPD:  -stable - continue Breo-Ellipta, Singulair, spiriva   Chronic pain: -avoid  opioids  -tylenol prn not to exceed 2000 mg per day  Ambulatory dysfunction -continue PT with Home health -Fall  precaution   Procedures:  None  Consultations:  None   Discharge Exam: BP (!) 116/52 (BP Location: Left Arm)   Pulse 87   Temp 98.5 F (36.9 C) (Oral)   Resp 18   Ht 5\' 6"  (1.676 m)   Wt 99.8 kg (220 lb) Comment: scale c  SpO2 91%   BMI 35.51 kg/m   General: 71 yo CF obese sitting up in bed in NAD Cardiovascular: RRR with grade 3/6 systolic murmur, no rubs or gallops Respiratory: CTA with no wheezes or rhonchi  Discharge Instructions You were cared for by a hospitalist during your hospital stay. If you have any questions about your discharge medications or the care you received while you were in the hospital after you are discharged, you can call the unit and asked to speak with the hospitalist on call if the hospitalist that took care of you is not available. Once you are discharged, your primary care physician will handle any further medical issues. Please note that NO REFILLS for any discharge medications will be authorized once you are discharged, as it is imperative that you return to your primary care physician (or establish a relationship with a primary care physician if you do not have one) for your aftercare needs so that they can reassess your need for medications and monitor your lab values.   Allergies as of 08/24/2017      Reactions   Codeine Nausea Only   Prednisone Hives   In different places all over the body.   Sulfonamide Derivatives Nausea Only      Medication List    STOP taking these medications   methylPREDNISolone 4 MG Tbpk tablet Commonly known as:  MEDROL DOSEPAK   traMADol 50 MG tablet Commonly known as:  ULTRAM     TAKE these medications   albuterol 108 (90 Base) MCG/ACT inhaler Commonly known as:  PROVENTIL HFA;VENTOLIN HFA Inhale 2 puffs into the lungs every 6 (six) hours as needed for wheezing.   amoxicillin 500 MG capsule Commonly known as:  AMOXIL take 4 capsules by mouth 30-60 MINUTES PRIOR TO DENTAL APPT   aspirin 81 MG  tablet Take 81 mg by mouth at bedtime.   BIOTIN PO Take 1 tablet by mouth at bedtime.   bisoprolol 5 MG tablet Commonly known as:  ZEBETA take 1/2 tablet by mouth once daily What changed:    how much to take  how to take this  when to take this   CALCIUM 600 + D PO Take 600 mg by mouth at bedtime.   cephALEXin 500 MG capsule Commonly known as:  KEFLEX Take 1 capsule (500 mg total) 2 (two) times daily for 5 days by mouth.   fluticasone furoate-vilanterol 200-25 MCG/INH Aepb Commonly known as:  BREO ELLIPTA Inhale 1 puff into the lungs daily.   furosemide 40 MG tablet Commonly known as:  LASIX take 1 tablet by mouth once daily 5 TIMES A WEEK   ipratropium-albuterol 0.5-2.5 (3) MG/3ML Soln Commonly known as:  DUONEB Take 3 mLs by nebulization every 6 (six) hours as needed. What changed:  reasons to take this   lovastatin 40 MG tablet Commonly known as:  MEVACOR take 1 tablet by mouth at bedtime   Miconazole Nitrate 2 % Powd Apply 1 application topically daily as needed (for skin). Apply to stomach and under breasts  montelukast 10 MG tablet Commonly known as:  SINGULAIR take 1 tablet by mouth at bedtime   polycarbophil 625 MG tablet Commonly known as:  FIBERCON Take 625 mg by mouth daily.   polyethylene glycol packet Commonly known as:  MIRALAX / GLYCOLAX Take 17 g by mouth daily.   potassium chloride 10 MEQ tablet Commonly known as:  K-DUR take 2 tablets by mouth once daily   SPIRIVA HANDIHALER 18 MCG inhalation capsule Generic drug:  tiotropium inhale the contents of one capsule in the handihaler once daily   vitamin B-12 500 MCG tablet Commonly known as:  CYANOCOBALAMIN Take 500 mcg by mouth daily.   Vitamin D 2000 units Caps Take 2,000 Units by mouth at bedtime.      Allergies  Allergen Reactions  . Codeine Nausea Only  . Prednisone Hives    In different places all over the body.  . Sulfonamide Derivatives Nausea Only      The  results of significant diagnostics from this hospitalization (including imaging, microbiology, ancillary and laboratory) are listed below for reference.    Significant Diagnostic Studies: Dg Chest 2 View  Result Date: 08/22/2017 CLINICAL DATA:  Chills.  Fever. EXAM: CHEST  2 VIEW COMPARISON:  Chest x-ray dated June 17, 2016. FINDINGS: Unchanged left chest wall pacer device with leads terminating in the right atrium and right ventricle. Postsurgical changes related to prior CABG and aortic and mitral valve replacements. Stable mild cardiomegaly. Mild pulmonary vascular congestion without overt edema. Trace right pleural effusion. No pneumothorax or consolidation. No acute osseous abnormality. IMPRESSION: Stable cardiomegaly and mild pulmonary vascular congestion. Trace right pleural effusion. Electronically Signed   By: Titus Dubin [name]   On: 08/22/2017 13:39    Microbiology: Recent Results (from the past 240 hour(s))  Urine culture     Status: Abnormal   Collection Time: 08/22/17 11:58 AM  Result Value Ref Range Status   Specimen Description URINE, CLEAN CATCH  Final   Special Requests NONE  Final   Culture >=100,000 COLONIES/mL ESCHERICHIA COLI (A)  Final   Report Status 08/24/2017 FINAL  Final   Organism ID, Bacteria ESCHERICHIA COLI (A)  Final      Susceptibility   Escherichia coli - MIC*    AMPICILLIN >=32 RESISTANT Resistant     CEFAZOLIN <=4 SENSITIVE Sensitive     CEFTRIAXONE <=1 SENSITIVE Sensitive     CIPROFLOXACIN >=4 RESISTANT Resistant     GENTAMICIN <=1 SENSITIVE Sensitive     IMIPENEM <=0.25 SENSITIVE Sensitive     NITROFURANTOIN <=16 SENSITIVE Sensitive     TRIMETH/SULFA >=320 RESISTANT Resistant     AMPICILLIN/SULBACTAM 16 INTERMEDIATE Intermediate     PIP/TAZO <=4 SENSITIVE Sensitive     Extended ESBL NEGATIVE Sensitive     * >=100,000 COLONIES/mL ESCHERICHIA COLI  Blood Culture (routine x 2)     Status: None (Preliminary result)   Collection Time: 08/22/17  12:05 PM  Result Value Ref Range Status   Specimen Description BLOOD LEFT HAND  Final   Special Requests IN PEDIATRIC BOTTLE Blood Culture adequate volume  Final   Culture NO GROWTH < 24 HOURS  Final   Report Status PENDING  Incomplete  Blood Culture (routine x 2)     Status: None (Preliminary result)   Collection Time: 08/22/17  1:19 PM  Result Value Ref Range Status   Specimen Description BLOOD RIGHT HAND  Final   Special Requests   Final    BOTTLES DRAWN AEROBIC AND ANAEROBIC Blood Culture  adequate volume   Culture NO GROWTH < 24 HOURS  Final   Report Status PENDING  Incomplete  MRSA PCR Screening     Status: None   Collection Time: 08/23/17  1:53 PM  Result Value Ref Range Status   MRSA by PCR NEGATIVE NEGATIVE Final    Comment:        The GeneXpert MRSA Assay (FDA approved for NASAL specimens only), is one component of a comprehensive MRSA colonization surveillance program. It is not intended to diagnose MRSA infection nor to guide or monitor treatment for MRSA infections.      Labs: Basic Metabolic Panel: Recent Labs  Lab 08/22/17 1158 08/23/17 0923 08/24/17 0620  NA 137 136 134*  K 3.3* 3.9 3.9  CL 106 108 104  CO2 22 22 24   GLUCOSE 116* 106* 116*  BUN 18 16 12   CREATININE 1.34* 0.99 0.75  CALCIUM 8.6* 8.4* 8.5*   Liver Function Tests: Recent Labs  Lab 08/22/17 1158 08/23/17 0923  AST 26 23  ALT 63* 45  ALKPHOS 116 91  BILITOT 1.4* 0.7  PROT 6.0* 5.3*  ALBUMIN 2.9* 2.5*   No results for input(s): LIPASE, AMYLASE in the last 168 hours. No results for input(s): AMMONIA in the last 168 hours. CBC: Recent Labs  Lab 08/22/17 1158 08/23/17 0923 08/24/17 0620  WBC 8.7 5.9 9.8  NEUTROABS 7.4  --   --   HGB 13.8 11.9* 12.2  HCT 42.4 37.9 38.1  MCV 95.5 95.7 95.5  PLT 129* 89* 87*   Cardiac Enzymes: Recent Labs  Lab 08/22/17 2045 08/23/17 0324 08/23/17 0923  TROPONINI 0.18* 0.18* 0.15*   BNP: BNP (last 3 results) No results for  input(s): BNP in the last 8760 hours.  ProBNP (last 3 results) No results for input(s): PROBNP in the last 8760 hours.  CBG: No results for input(s): GLUCAP in the last 168 hours.     Signed:  Kayleen Memos, MD [facility] 08/24/2017, 12:19 PM

## 2017-08-24 NOTE — Clinical Social Work Note (Signed)
CSW acknowledges SNF consult. PT evaluated her yesterday and determined she is not appropriate for SNF. Patient has orders to discharge home today.  CSW signing off.  Dayton Scrape, [place]

## 2017-08-25 ENCOUNTER — Telehealth: Payer: Self-pay | Admitting: Family Medicine

## 2017-08-25 ENCOUNTER — Telehealth: Payer: Self-pay | Admitting: *Deleted

## 2017-08-25 NOTE — Telephone Encounter (Signed)
Attempted to contact pt to complete TCM; line busy

## 2017-08-25 NOTE — Telephone Encounter (Signed)
Patient is calling in today about her discharge paper work from her recent admission.  Patient was admitted on 08-22-17.  Patient was questioning why the AVS says to stop tramadol. / Advised patient it was not clear as to the specific reason the provider wanted her to stop tramadol.  Per discharge note, the provider recommends tylenol for chronic pain.  Patient was advised this.  Patient states tylenol does nothing for her and that Dr. Danise Mina is aware. / Patient has a follow up appointment with Dr. Darnell Level on 09/05/2017.  I told her to make sure she discussed the use of tramadol with him at that time. / Patient then asked if there was a contraindication for taking keflex and tramadol.  I explained that there was no contraindication. / I did advise patient to follow the orders of the provider until she followed up with Dr. Darnell Level.  Patient is in agreement.

## 2017-08-27 LAB — CULTURE, BLOOD (ROUTINE X 2)
Culture: NO GROWTH
Culture: NO GROWTH
Special Requests: ADEQUATE
Special Requests: ADEQUATE

## 2017-08-29 ENCOUNTER — Encounter (HOSPITAL_COMMUNITY): Payer: Medicare Other

## 2017-08-30 ENCOUNTER — Ambulatory Visit: Payer: Self-pay | Admitting: *Deleted

## 2017-08-30 ENCOUNTER — Encounter: Payer: Self-pay | Admitting: Family Medicine

## 2017-08-30 ENCOUNTER — Ambulatory Visit (INDEPENDENT_AMBULATORY_CARE_PROVIDER_SITE_OTHER): Payer: Medicare Other | Admitting: Family Medicine

## 2017-08-30 VITALS — BP 144/81 | HR 71 | Temp 97.7°F

## 2017-08-30 DIAGNOSIS — N390 Urinary tract infection, site not specified: Secondary | ICD-10-CM

## 2017-08-30 DIAGNOSIS — R350 Frequency of micturition: Secondary | ICD-10-CM

## 2017-08-30 LAB — POC URINALSYSI DIPSTICK (AUTOMATED)
Bilirubin, UA: NEGATIVE
Blood, UA: NEGATIVE
Glucose, UA: NEGATIVE
Ketones, UA: NEGATIVE
Leukocytes, UA: NEGATIVE
Nitrite, UA: NEGATIVE
Protein, UA: NEGATIVE
Spec Grav, UA: 1.015 (ref 1.010–1.025)
Urobilinogen, UA: 0.2 E.U./dL
pH, UA: 6 (ref 5.0–8.0)

## 2017-08-30 MED ORDER — CEFTRIAXONE SODIUM 1 G IJ SOLR
1.0000 g | Freq: Once | INTRAMUSCULAR | Status: AC
  Administered 2017-08-30: 1 g via INTRAMUSCULAR

## 2017-08-30 MED ORDER — CEPHALEXIN 500 MG PO CAPS: 500.0000 mg | ORAL_CAPSULE | Freq: Two times a day (BID) | ORAL | 0 refills | Status: DC

## 2017-08-30 NOTE — Telephone Encounter (Signed)
Pt admit 08/22/17-08/24/17 for sepsis due to UTI; pt sent home with cephelaxin and finished yesterday; pt complains of still having" tinges of pain and pressure; pt has follow up appt on 09/04/17 but does not want to get into trouble this weekend; Spoke with Waynetta at [facility] for appt but none available; pt accepted appt with Dr Dimas Chyle at 1140 today; pt verbalizes understanding; will route to [place] pool to alert them of this appt Reason for Disposition . [1] Taking antibiotic > 72 hours (3 days) for UTI AND [2] painful urination or frequency not improved  Answer Assessment - Initial Assessment Questions 1. ANTIBIOTIC: "What antibiotic are you taking?" "How many times per day?"     Cephalexin 500 mg (2 twice daily) 2. DURATION: "When was the antibiotic started?"     Nov 15-20 3. MAIN SYMPTOM: "What is the main symptom you are concerned about?"     pain 4. FEVER: "Do you have a fever?" If so, ask: "What is it, how was it measured, and when did it start?"     Not sure 5. OTHER SYMPTOMS: "Do you have any other symptoms?" (e.g., flank pain, vaginal discharge, blood in urine)     Pain and pressure prior to urinating; still having burning when urination  Protocols used: URINARY TRACT INFECTION ON ANTIBIOTIC FOLLOW-UP CALL [facility]

## 2017-08-30 NOTE — Progress Notes (Signed)
   Subjective:  [name] is a 71 y.o. female who presents today with a chief complaint of dysuria.   HPI:  Dysuria, new issue Summary of recent hospitalization: Patient admitted on 08/22/2017 and discharged on 08/24/2017.  She initially presented to the emergency room with generalized fatigue, urinary frequency, and dysuria.  She was diagnosed with sepsis secondary to UTI and started on IV antibiotics.  She received 2 days of IV antibiotics and was discharged on a 5-day course of Keflex.  Since being discharged.  She has done well until yesterday.  Patient reports that she completed her 5-day course of antibiotics and did not have symptoms until she finished her course yesterday.  Thinks that the antibiotics helped with her symptoms.  Yesterday evening, she started having similar symptoms as she did when she initially went to the hospital-dysuria, lower abdominal pain, and generalized fatigue.  No fevers.  No back pain. Symptoms have been stable since yesterday.   ROS: Per HPI  PMH: Smoking history reviewed.   Former smoker.  Objective:  Physical Exam: BP (!) 144/81   Pulse 71   Temp 97.7 F (36.5 C) (Oral)   Gen: NAD, resting comfortably CV: RRR with no murmurs appreciated Pulm: NWOB, CTAB with no crackles, wheezes, or rhonchi GI: Normal bowel sounds present. Soft, Nontender, Nondistended. MSK: No edema, cyanosis, or clubbing noted.  No CVA tenderness Skin: Warm, dry Neuro: Grossly normal, moves all extremities Psych: Normal affect and thought content  Results for orders placed or performed in visit on 08/30/17 (from the past 72 hour(s))  POCT Urinalysis Dipstick (Automated)     Status: None   Collection Time: 08/30/17 11:31 AM  Result Value Ref Range   Color, UA Yellow    Clarity, UA Clear    Glucose, UA Negative    Bilirubin, UA Negative    Ketones, UA Negative    Spec Grav, UA 1.015 1.010 - 1.025   Blood, UA Negative    pH, UA 6.0 5.0 - 8.0   Protein, UA  Negative    Urobilinogen, UA 0.2 0.2 or 1.0 E.U./dL   Nitrite, UA Negative    Leukocytes, UA Negative Negative   *Note: Due to a large number of results and/or encounters for the requested time period, some results have not been displayed. A complete set of results can be found in Results Review.   Summary/review of hospital labs: Urine culture 08/22/2017: E. coli sensitive to cefazolin and ceftriaxone.  Assessment/Plan:  Urinary tract infection Patient's UA is negative, however her clinical signs are consistent with recurrence or incomplete eradication of her urinary tract infection.  She does not have any signs of systemic illness and her vitals are stable today.  We will give 1 g of Rocephin today while in office.  Start Keflex 500 mg twice daily starting tomorrow for an additional 7 days.  Check urine culture.  If continues to have symptoms despite prolonged course of antibiotics, will consider imaging to look for possible nidus for infection.  Return precautions reviewed.    Algis Greenhouse. Jerline Pain, MD 08/30/2017 5:05 PM

## 2017-08-31 LAB — URINE CULTURE
MICRO NUMBER:: 81314899
SPECIMEN QUALITY:: ADEQUATE

## 2017-09-04 NOTE — Progress Notes (Signed)
Urine culture negative. No need to change antibiotics. She should follow up with her PCP as scheduled.  Algis Greenhouse. Jerline Pain, MD 09/04/2017 10:33 AM

## 2017-09-05 ENCOUNTER — Ambulatory Visit (INDEPENDENT_AMBULATORY_CARE_PROVIDER_SITE_OTHER): Payer: Medicare Other | Admitting: Family Medicine

## 2017-09-05 ENCOUNTER — Encounter (HOSPITAL_COMMUNITY): Payer: Medicare Other

## 2017-09-05 ENCOUNTER — Encounter: Payer: Self-pay | Admitting: Family Medicine

## 2017-09-05 VITALS — BP 120/80 | HR 84 | Temp 98.6°F | Wt 213.0 lb

## 2017-09-05 DIAGNOSIS — N390 Urinary tract infection, site not specified: Secondary | ICD-10-CM

## 2017-09-05 DIAGNOSIS — J441 Chronic obstructive pulmonary disease with (acute) exacerbation: Secondary | ICD-10-CM

## 2017-09-05 DIAGNOSIS — Z952 Presence of prosthetic heart valve: Secondary | ICD-10-CM | POA: Diagnosis not present

## 2017-09-05 DIAGNOSIS — M533 Sacrococcygeal disorders, not elsewhere classified: Secondary | ICD-10-CM | POA: Diagnosis not present

## 2017-09-05 DIAGNOSIS — A419 Sepsis, unspecified organism: Secondary | ICD-10-CM | POA: Diagnosis not present

## 2017-09-05 LAB — POC URINALSYSI DIPSTICK (AUTOMATED)
Bilirubin, UA: NEGATIVE
Blood, UA: NEGATIVE
Glucose, UA: NEGATIVE
Ketones, UA: NEGATIVE
Leukocytes, UA: NEGATIVE
Nitrite, UA: NEGATIVE
Spec Grav, UA: 1.015 (ref 1.010–1.025)
Urobilinogen, UA: 0.2 E.U./dL
pH, UA: 7 (ref 5.0–8.0)

## 2017-09-05 NOTE — Assessment & Plan Note (Signed)
Increased wheezing noted today - I recommended scheduled duonebs next 3 days, update if not improved with this. Continue regular spiriva and breo and singulair.

## 2017-09-05 NOTE — Progress Notes (Signed)
BP 120/80 (BP Location: Left Arm, Patient Position: Sitting, Cuff Size: Large)   Pulse 84   Temp 98.6 F (37 C) (Oral)   Wt 213 lb (96.6 kg)   SpO2 94%   BMI 34.38 kg/m    CC: hosp f/u visit Subjective:    Patient ID: [name], female    DOB: Jun 12, 1946, 71 y.o.   MRN: [id number]  HPI: [name] is a 71 y.o. female presenting on 09/05/2017 for Hospitalization Follow-up (Admitted to [facility] via ER visit on 08/22/17. Dx: sepsis due to UTI)   See recent OV with Dr Jerline Pain for details.  Recent hospitalization for urosepsis, records reviewed - UCx growing E coli resistant to cipro and bactrim and ampicillin, sensitive to keflex - treated with 5d keflex after IV abx (rocephin then cipro) in hospital. blcx no growth final x2. Seen last week in f/u with recurrent sxs, UCx negative. Treated with 1g rocephin and another 7d keflex course. Last dose was last night.   Ongoing mild supapubic pressure. Ongoing fatigue. No urinary symptoms. No fevers.   HHPT was ordered in hospital. She is working with home therapist to help stamina.   Relevant past medical, surgical, family and social history reviewed and updated as indicated. Interim medical history since our last visit reviewed. Allergies and medications reviewed and updated. Outpatient Medications Prior to Visit  Medication Sig Dispense Refill  . albuterol (PROVENTIL HFA;VENTOLIN HFA) 108 (90 Base) MCG/ACT inhaler Inhale 2 puffs into the lungs every 6 (six) hours as needed for wheezing. 1 Inhaler 2  . amoxicillin (AMOXIL) 500 MG capsule take 4 capsules by mouth 30-60 MINUTES PRIOR TO DENTAL APPT 4 capsule 12  . aspirin 81 MG tablet Take 81 mg by mouth at bedtime.     Marland Kitchen BIOTIN PO Take 1 tablet by mouth at bedtime.     . bisoprolol (ZEBETA) 5 MG tablet take 1/2 tablet by mouth once daily (Patient taking differently: take 1/2 tablet(2.5mg ) by mouth once daily) 15 tablet 5  . Calcium Carbonate-Vitamin D (CALCIUM 600 + D PO) Take 600 mg  by mouth at bedtime.     . Cholecalciferol (VITAMIN D) 2000 UNITS CAPS Take 2,000 Units by mouth at bedtime.     . fluticasone furoate-vilanterol (BREO ELLIPTA) 200-25 MCG/INH AEPB Inhale 1 puff into the lungs daily. 60 each 8  . furosemide (LASIX) 40 MG tablet take 1 tablet by mouth once daily 5 TIMES A WEEK 20 tablet 5  . ipratropium-albuterol (DUONEB) 0.5-2.5 (3) MG/3ML SOLN Take 3 mLs by nebulization every 6 (six) hours as needed. (Patient taking differently: Take 3 mLs by nebulization every 6 (six) hours as needed (for wheezing or shortness of breath). ) 360 mL 0  . lovastatin (MEVACOR) 40 MG tablet take 1 tablet by mouth at bedtime 30 tablet 5  . Miconazole Nitrate 2 % POWD Apply 1 application topically daily as needed (for skin). Apply to stomach and under breasts    . montelukast (SINGULAIR) 10 MG tablet take 1 tablet by mouth at bedtime 30 tablet 5  . polycarbophil (FIBERCON) 625 MG tablet Take 625 mg by mouth daily.    . polyethylene glycol (MIRALAX / GLYCOLAX) packet Take 17 g by mouth daily. 14 each 0  . potassium chloride (K-DUR) 10 MEQ tablet take 2 tablets by mouth once daily 60 tablet 5  . SPIRIVA HANDIHALER 18 MCG inhalation capsule inhale the contents of one capsule in the handihaler once daily 30 capsule 5  .  vitamin B-12 (CYANOCOBALAMIN) 500 MCG tablet Take 500 mcg by mouth daily.    . cephALEXin (KEFLEX) 500 MG capsule Take 1 capsule (500 mg total) by mouth 2 (two) times daily for 8 days. Take for 7 days 10 capsule 0  . traMADol (ULTRAM) 50 MG tablet Take 0.5-1 tablets (25-50 mg total) by mouth 2 (two) times daily as needed for moderate pain.  0   No facility-administered medications prior to visit.      Per HPI unless specifically indicated in ROS section below Review of Systems     Objective:    BP 120/80 (BP Location: Left Arm, Patient Position: Sitting, Cuff Size: Large)   Pulse 84   Temp 98.6 F (37 C) (Oral)   Wt 213 lb (96.6 kg)   SpO2 94%   BMI 34.38 kg/m    Wt Readings from Last 3 Encounters:  09/05/17 213 lb (96.6 kg)  08/24/17 220 lb (99.8 kg)  08/09/17 226 lb (102.5 kg)    Physical Exam  Constitutional: She appears well-developed and well-nourished. No distress.  HENT:  Head: Normocephalic and atraumatic.  Mouth/Throat: Oropharynx is clear and moist. No oropharyngeal exudate.  Cardiovascular: Normal rate, regular rhythm and intact distal pulses.  Murmur (3/6 SEM throughout) heard. Pulmonary/Chest: Effort normal. No respiratory distress. She has wheezes. She has no rales.  Diffuse exp wheezing present  Abdominal: Soft. Normal appearance and bowel sounds are normal. She exhibits no distension and no mass. There is no tenderness. There is no rigidity, no rebound, no guarding, no CVA tenderness and negative Murphy's sign.  Musculoskeletal: She exhibits no edema.  Nursing note and vitals reviewed.  Lab Results  Component Value Date   CREATININE 0.75 08/24/2017       Assessment & Plan:   Problem List Items Addressed This Visit    COPD (chronic obstructive pulmonary disease) ([place])    Increased wheezing noted today - I recommended scheduled duonebs next 3 days, update if not improved with this. Continue regular spiriva and breo and singulair.       Pain of right sacroiliac joint    Persistent, slow improvement.      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 Resolved after 2nd keflex course.  Now with residual fatigue, working with [facility] on this. Appreciate their care. Reviewed red flags to seek medical care.           Follow up plan: Return if symptoms worsen or fail to improve.  Ria Bush, MD

## 2017-09-05 NOTE — Assessment & Plan Note (Addendum)
Urosepsis, UCx grew E coli. Resolved after 2nd keflex course.  Now with residual fatigue, working with [facility] on this. Appreciate their care. Reviewed red flags to seek medical care.

## 2017-09-05 NOTE — Assessment & Plan Note (Signed)
Persistent, slow improvement.

## 2017-09-05 NOTE — Addendum Note (Signed)
Addended by: Brenton Grills on: 84/78/4128 02:32 PM   Modules accepted: Orders

## 2017-09-05 NOTE — Patient Instructions (Addendum)
You are doing better today Urine looking ok Continue working with PT Increase duoneb use over next 2-3 days. Ok to continue tramadol sparingly as up to now.

## 2017-09-07 ENCOUNTER — Encounter (HOSPITAL_COMMUNITY): Payer: Medicare Other

## 2017-09-12 ENCOUNTER — Encounter (HOSPITAL_COMMUNITY): Payer: Medicare Other

## 2017-09-14 ENCOUNTER — Encounter (HOSPITAL_COMMUNITY): Payer: Medicare Other

## 2017-09-19 ENCOUNTER — Encounter (HOSPITAL_COMMUNITY): Payer: Medicare Other

## 2017-09-21 ENCOUNTER — Encounter (HOSPITAL_COMMUNITY): Payer: Medicare Other

## 2017-09-26 ENCOUNTER — Encounter (HOSPITAL_COMMUNITY): Payer: Medicare Other

## 2017-09-28 ENCOUNTER — Encounter (HOSPITAL_COMMUNITY): Payer: Medicare Other

## 2017-10-05 ENCOUNTER — Encounter (HOSPITAL_COMMUNITY): Payer: Medicare Other

## 2017-10-12 ENCOUNTER — Encounter (HOSPITAL_COMMUNITY): Payer: Medicare Other

## 2017-10-17 ENCOUNTER — Encounter (HOSPITAL_COMMUNITY): Payer: Medicare Other

## 2017-10-18 ENCOUNTER — Telehealth: Payer: Self-pay | Admitting: Cardiovascular Disease

## 2017-10-18 DIAGNOSIS — I130 Hypertensive heart and chronic kidney disease with heart failure and stage 1 through stage 4 chronic kidney disease, or unspecified chronic kidney disease: Secondary | ICD-10-CM | POA: Diagnosis not present

## 2017-10-18 DIAGNOSIS — A4151 Sepsis due to Escherichia coli [E. coli]: Secondary | ICD-10-CM | POA: Diagnosis not present

## 2017-10-18 DIAGNOSIS — R2689 Other abnormalities of gait and mobility: Secondary | ICD-10-CM | POA: Diagnosis not present

## 2017-10-18 MED ORDER — AMOXICILLIN 500 MG PO CAPS: ORAL_CAPSULE | ORAL | 12 refills | Status: DC

## 2017-10-18 NOTE — Telephone Encounter (Signed)
°*  STAT* If patient is at the pharmacy, call can be transferred to refill team.   1. Which medications need to be refilled? (please list name of each medication and dose if known) amoxicillin (AMOXIL) 500 MG capsule 2. Which pharmacy/location (including street and city if local pharmacy) is medication to be sent to? [facility] on [address] in [place]  [phone]  3. Do they need a 30 day or 90 day supply? 4 pills

## 2017-10-19 ENCOUNTER — Encounter (HOSPITAL_COMMUNITY): Payer: Medicare Other

## 2017-10-23 ENCOUNTER — Ambulatory Visit (INDEPENDENT_AMBULATORY_CARE_PROVIDER_SITE_OTHER): Payer: Medicare Other | Admitting: *Deleted

## 2017-10-23 DIAGNOSIS — I441 Atrioventricular block, second degree: Secondary | ICD-10-CM

## 2017-10-24 ENCOUNTER — Encounter (HOSPITAL_COMMUNITY): Payer: Medicare Other

## 2017-10-25 LAB — CUP PACEART REMOTE DEVICE CHECK
Battery Remaining Longevity: 122 mo
Battery Remaining Percentage: 95.5 %
Battery Voltage: 3.01 V
Brady Statistic AP VP Percent: 37 %
Brady Statistic AP VS Percent: 8 %
Brady Statistic AS VP Percent: 9.3 %
Brady Statistic AS VS Percent: 46 %
Brady Statistic RA Percent Paced: 44 %
Brady Statistic RV Percent Paced: 46 %
Date Time Interrogation Session: 20190114200311
Implantable Lead Implant Date: 20170907
Implantable Lead Implant Date: 20170907
Implantable Lead Location: 753859
Implantable Lead Location: 753860
Implantable Pulse Generator Implant Date: 20170907
Lead Channel Impedance Value: 430 Ohm
Lead Channel Impedance Value: 690 Ohm
Lead Channel Pacing Threshold Amplitude: 0.5 V
Lead Channel Pacing Threshold Amplitude: 0.75 V
Lead Channel Pacing Threshold Pulse Width: 0.5 ms
Lead Channel Pacing Threshold Pulse Width: 0.5 ms
Lead Channel Sensing Intrinsic Amplitude: 12 mV
Lead Channel Sensing Intrinsic Amplitude: 3.9 mV
Lead Channel Setting Pacing Amplitude: 1 V
Lead Channel Setting Pacing Amplitude: 2 V
Lead Channel Setting Pacing Pulse Width: 0.5 ms
Lead Channel Setting Sensing Sensitivity: 2 mV
Pulse Gen Model: 2272
Pulse Gen Serial Number: 7945283

## 2017-10-25 NOTE — Progress Notes (Signed)
Remote pacemaker transmission.   

## 2017-10-26 ENCOUNTER — Encounter (HOSPITAL_COMMUNITY): Payer: Medicare Other

## 2017-10-27 ENCOUNTER — Encounter: Payer: Self-pay | Admitting: Cardiology

## 2017-10-31 ENCOUNTER — Encounter: Payer: Self-pay | Admitting: Internal Medicine

## 2017-10-31 ENCOUNTER — Encounter (HOSPITAL_COMMUNITY): Payer: Medicare Other

## 2017-10-31 ENCOUNTER — Ambulatory Visit (INDEPENDENT_AMBULATORY_CARE_PROVIDER_SITE_OTHER): Payer: Medicare Other | Admitting: Internal Medicine

## 2017-10-31 VITALS — BP 124/62 | HR 76 | Ht 66.0 in | Wt 213.2 lb

## 2017-10-31 DIAGNOSIS — R490 Dysphonia: Secondary | ICD-10-CM | POA: Diagnosis not present

## 2017-10-31 DIAGNOSIS — J449 Chronic obstructive pulmonary disease, unspecified: Secondary | ICD-10-CM

## 2017-10-31 DIAGNOSIS — R5381 Other malaise: Secondary | ICD-10-CM | POA: Diagnosis not present

## 2017-10-31 NOTE — Patient Instructions (Addendum)
ICD-10-CM   1. COPD, moderate ([place]) J44.9   2. Physical deconditioning R53.81   3. Hoarseness of voice R49.0     Stable copd Glad uptodate with flu shot Deconditioning is post sepsis Hoarseness likely inhaler and aging related but will keep an eye  Plan Continue singulair, breo, spiriva scheduled with duoneb/albuterol as needed Try to get back to rehab asap If hoarseness persists will refer ENT  Followup 3-4 months or sooner if needed

## 2017-10-31 NOTE — Progress Notes (Signed)
Subjective:     Patient ID: [name], female   DOB: October 18, 1945, 72 y.o.   MRN: [id number]  HPI  OV 08/02/2017  Chief Complaint  Patient presents with  . Follow-up    Pt states that she got bronchitis x1 month ago. Has been on two rounds of abx and two rounds of steroids. States that it is not in her chest anymore but is still having drainage, sore throat, dry cough, occ. SOB. Denies any CP.    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 She comes for routine follow-up. Recently she had COPD flareup was treated by primary care. Then I called in antibiotic and repeat prednisone and this again helped her. Nevertheless she is frustrated by postnasal drainage and the resultant cough. She says normally for COPD exacerbations are associated with initial onset of sinus congestion. Then she's always left with a residual postnasal drip but this time it has lingered longer. She says Sudafed helps her but is no longer available over-the-counter. She really wants relief about this critically. COPD cat score is 14. Is also asking me to sign handicap parking sticker   OV 10/31/2017  Chief Complaint  Patient presents with  . Follow-up    In hosp. 11/13-11/15 due to sepsis from UTI. Pt states she is doing good but can't seem to get her energy back. Pt currently has laryngitis and SOB with exertion.    Follow-up Gold stage II/III COPD not on home oxygen but on triple inhaler therapy.  Is a routine follow-up.  Since I last saw her in the middle of November 2018 she got diagnosed with E. coli sepsis and admitted to the hospital.  Since then she has significant amount of fatigue.  The fatigue is not improved much.  She has suspended her maintenance pulmonary rehabilitation but plans to restart it in March 2019.  She has not had any COPD exacerbations.  She is up-to-date with the flu shot.  Overall COPD CAT score is stable but there is an increase in the level of fatigue.  The  only new issue that she complains about is some mild hoarseness in voice going on for several months.  This is present early in the morning when she wakes up and then as the day goes on it is better.  She notices it when she talks a lot of time in the phone after a long period of rest.  It is insidious onset.  It is not progressive.  There are no other associated symptoms.    CAT COPD Symptom & Quality of Life Score (GSK trademark) 0 is no burden. 5 is highest burden 08/02/2017  10/31/2017   Never Cough -> Cough all the time 1 2  No phlegm in chest -> Chest is full of phlegm 0 0  No chest tightness -> Chest feels very tight 1 0  No dyspnea for 1 flight stairs/hill -> Very dyspneic for 1 flight of stairs 5 4  No limitations for ADL at home -> Very limited with ADL at home 1 0  Confident leaving home -> Not at all confident leaving home 0 0  Sleep soundly -> Do not sleep soundly because of lung condition 4 4  Lots of Energy -> No energy at all 2 3  TOTAL Score (max 40)  14 13      has a past medical history of Acute cystitis without hematuria (12/22/2015), Anemia, Arthritis, CAP (community acquired pneumonia) (02/08/2016), Cataracts,  bilateral, Chronic diastolic heart failure (HCC) (09/19/2014), Chronic insomnia, Constipation, COPD (chronic obstructive pulmonary disease) ([place]), DDD (degenerative disc disease), Essential hypertension, GERD (gastroesophageal reflux disease), History of pneumonia (02/16/2016), History of radiation therapy (05/30/11 to 07/07/11), History of shingles, Hyperlipemia, Legally blind in left eye, as defined in [place], Lung nodule, Obesity, Osteopenia (12/2014), Pancreatitis (11/2014), Rectal cancer ([place]) (04/2011), Rheumatic heart disease mitral stenosis, S/P AVR (aortic valve replacement) (2015), S/P MVR (mitral valve replacement) (2015), Seizures ([place]), Severe aortic valve stenosis  AVR 3/15 (10/01/2013), Sleep apnea, Small bowel obstruction, partial ([place]) (08/2013), and Vitamin B12  deficiency (02/28/2015).   reports that she quit smoking about 42 years ago. Her smoking use included cigarettes. She has a 15.00 pack-year smoking history. she has never used smokeless tobacco.  Past Surgical History:  Procedure Laterality Date  . AORTIC VALVE REPLACEMENT N/A 12/12/2013   Procedure: AORTIC VALVE REPLACEMENT (AVR);  Surgeon: Gaye Pollack, MD; Service: Open Heart Surgery  . BACK SURGERY  2006, 2007   2006 SPACER, 2007 decompression and fusion L4/5  . BOWEL RESECTION  04/02/2012   Procedure: SMALL BOWEL RESECTION;  Surgeon: Stark Klein, MD;  Location: [facility];  Service: General;  Laterality: N/A;  . CARPAL TUNNEL RELEASE Bilateral   . CATARACT EXTRACTION W/ INTRAOCULAR LENS IMPLANT Left 10/18/2016  . CHOLECYSTECTOMY N/A 01/21/2015   chronic cholecystitis, Stark Klein, MD  . COLON RESECTION  08/25/2011   Procedure: COLON RESECTION LAPAROSCOPIC;  Surgeon: Stark Klein, MD;  Location: [facility];  Service: General;  Laterality: N/A;  Laparoscopic Assisted Low Anterior Resection Diverting Ostomy and onQ pain pump  . COLONOSCOPY  03/2013   1 polyp, rpt 3 yrs Ardis Hughs)  . COLONOSCOPY WITH PROPOFOL N/A 06/09/2016   patent colo-colonic anastomosis, rpt 5 yrs Ardis Hughs)  . EP IMPLANTABLE DEVICE N/A 06/16/2016   Procedure: Pacemaker Implant;  Surgeon: Will Meredith Leeds, MD;  Location: [facility];  Service: Cardiovascular;  Laterality: N/A;  . FOOT SURGERY  left foot   hammer toe  . HAND SURGERY  Bil   carpal tunnel  . ILEOSTOMY  08/25/2011  . ILEOSTOMY CLOSURE  04/02/2012   Procedure: ILEOSTOMY TAKEDOWN;  Surgeon: Stark Klein, MD;  Location: [facility];  Service: General;  Laterality: N/A;  . INTRAOPERATIVE TRANSESOPHAGEAL ECHOCARDIOGRAM N/A 12/12/2013   Procedure: INTRAOPERATIVE TRANSESOPHAGEAL ECHOCARDIOGRAM;  Surgeon: Gaye Pollack, MD;  Location: [facility];  Service: Open Heart Surgery;  Laterality: N/A;  . LEFT AND RIGHT HEART CATHETERIZATION WITH CORONARY ANGIOGRAM N/A 11/27/2013    Procedure: LEFT AND RIGHT HEART CATHETERIZATION WITH CORONARY ANGIOGRAM;  Surgeon: Sanda Klein, MD;  Location: [facility];  Service: Cardiovascular;  Laterality: N/A;  . MITRAL VALVE REPLACEMENT N/A 12/12/2013   Procedure: MITRAL VALVE (MV) REPLACEMENT OR REPAIR;  Surgeon: Gaye Pollack, MD; Service: Open Heart Surgery  . TEE WITHOUT CARDIOVERSION N/A 11/29/2013   Procedure: TRANSESOPHAGEAL ECHOCARDIOGRAM (TEE);  Surgeon: Sueanne Margarita, MD;  Location: [facility];  Service: Cardiovascular;  Laterality: N/A;  . TONSILLECTOMY      Allergies  Allergen Reactions  . Sulfa Antibiotics   . Codeine Nausea Only  . Prednisone Hives    In different places all over the body.  . Sulfonamide Derivatives Nausea Only    Immunization History  Administered Date(s) Administered  . H1N1 09/17/2008  . Influenza Split 07/18/2012  . Influenza Whole 08/10/2005, 06/24/2009, 07/11/2011  . Influenza, High Dose Seasonal PF 07/04/2016, 07/04/2017  . Influenza-Unspecified 07/24/2013, 06/30/2014, 06/30/2015  . Pneumococcal Conjugate-13 06/02/2014  .  Pneumococcal Polysaccharide-23 08/10/2005, 04/21/2008, 06/25/2013  . Td 10/11/2005  . Zoster 09/16/2014    Family History  Problem Relation Age of Onset  . Lung cancer Father   . Heart disease Mother   . Diabetes Mother   . Hypertension Mother   . Stroke Mother   . CAD Son 52  . Heart attack Son 42  . Heart attack Maternal Grandfather   . Breast cancer Maternal Aunt 83  . Colon cancer Neg Hx   . Esophageal cancer Neg Hx   . Stomach cancer Neg Hx   . Rectal cancer Neg Hx      Current Outpatient Medications:  .  acetaminophen (TYLENOL) 325 MG tablet, Take 325 mg by mouth as needed for mild pain or headache (2 tablets as needed)., Disp: , Rfl:  .  albuterol (PROVENTIL HFA;VENTOLIN HFA) 108 (90 Base) MCG/ACT inhaler, Inhale 2 puffs into the lungs every 6 (six) hours as needed for wheezing., Disp: 1 Inhaler, Rfl: 2 .  aspirin 81 MG tablet, Take 81 mg  by mouth at bedtime. , Disp: , Rfl:  .  BIOTIN PO, Take 1 tablet by mouth at bedtime. , Disp: , Rfl:  .  bisoprolol (ZEBETA) 5 MG tablet, take 1/2 tablet by mouth once daily (Patient taking differently: take 1/2 tablet(2.5mg ) by mouth once daily), Disp: 15 tablet, Rfl: 5 .  Calcium Carbonate-Vitamin D (CALCIUM 600 + D PO), Take 600 mg by mouth at bedtime. , Disp: , Rfl:  .  Cholecalciferol (VITAMIN D) 2000 UNITS CAPS, Take 2,000 Units by mouth at bedtime. , Disp: , Rfl:  .  fluticasone furoate-vilanterol (BREO ELLIPTA) 200-25 MCG/INH AEPB, Inhale 1 puff into the lungs daily., Disp: 60 each, Rfl: 8 .  furosemide (LASIX) 40 MG tablet, take 1 tablet by mouth once daily 5 TIMES A WEEK, Disp: 20 tablet, Rfl: 5 .  ipratropium-albuterol (DUONEB) 0.5-2.5 (3) MG/3ML SOLN, Take 3 mLs by nebulization every 6 (six) hours as needed. (Patient taking differently: Take 3 mLs by nebulization every 6 (six) hours as needed (for wheezing or shortness of breath). ), Disp: 360 mL, Rfl: 0 .  lovastatin (MEVACOR) 40 MG tablet, take 1 tablet by mouth at bedtime, Disp: 30 tablet, Rfl: 5 .  Miconazole Nitrate 2 % POWD, Apply 1 application topically daily as needed (for skin). Apply to stomach and under breasts, Disp: , Rfl:  .  montelukast (SINGULAIR) 10 MG tablet, take 1 tablet by mouth at bedtime, Disp: 30 tablet, Rfl: 5 .  polycarbophil (FIBERCON) 625 MG tablet, Take 625 mg by mouth daily., Disp: , Rfl:  .  polyethylene glycol (MIRALAX / GLYCOLAX) packet, Take 17 g by mouth daily., Disp: 14 each, Rfl: 0 .  potassium chloride (K-DUR) 10 MEQ tablet, take 2 tablets by mouth once daily, Disp: 60 tablet, Rfl: 5 .  SPIRIVA HANDIHALER 18 MCG inhalation capsule, inhale the contents of one capsule in the handihaler once daily, Disp: 30 capsule, Rfl: 5 .  traMADol (ULTRAM) 50 MG tablet, Take 0.5-1 tablets (25-50 mg total) by mouth 2 (two) times daily as needed for moderate pain., Disp: , Rfl: 0 .  vitamin B-12 (CYANOCOBALAMIN) 500  MCG tablet, Take 500 mcg by mouth daily., Disp: , Rfl:  .  amoxicillin (AMOXIL) 500 MG capsule, take 4 capsules by mouth 30-60 MINUTES PRIOR TO DENTAL APPT (Patient not taking: Reported on 10/31/2017), Disp: 4 capsule, Rfl: 12   Review of Systems     Objective:   Physical Exam  Constitutional:  She is oriented to person, place, and time. She appears well-developed and well-nourished. No distress.  HENT:  Head: Normocephalic and atraumatic.  Right Ear: External ear normal.  Left Ear: External ear normal.  Mouth/Throat: Oropharynx is clear and moist. No oropharyngeal exudate.  Eyes: Conjunctivae and EOM are normal. Pupils are equal, round, and reactive to light. Right eye exhibits no discharge. Left eye exhibits no discharge. No scleral icterus.  Neck: Normal range of motion. Neck supple. No JVD present. No tracheal deviation present. No thyromegaly present.  Cardiovascular: Normal rate, regular rhythm, normal heart sounds and intact distal pulses. Exam reveals no gallop and no friction rub.  No murmur heard. Pulmonary/Chest: Effort normal. No respiratory distress. She has no wheezes. She has no rales. She exhibits no tenderness.  occ squeaks - baseline- at base of chest  Abdominal: Soft. Bowel sounds are normal. She exhibits no distension and no mass. There is no tenderness. There is no rebound and no guarding.  Musculoskeletal: Normal range of motion. She exhibits no edema or tenderness.  Lymphadenopathy:    She has no cervical adenopathy.  Neurological: She is alert and oriented to person, place, and time. She has normal reflexes. No cranial nerve deficit. She exhibits normal muscle tone. Coordination normal.  Skin: Skin is warm and dry. No rash noted. She is not diaphoretic. No erythema. No pallor.  Psychiatric: She has a normal mood and affect. Her behavior is normal. Judgment and thought content normal.  Vitals reviewed.  Vitals:   10/31/17 1141  BP: 124/62  Pulse: 76  SpO2: 98%   Weight: 213 lb 3.2 oz (96.7 kg)  Height: 5\' 6"  (1.676 m)    Estimated body mass index is 34.41 kg/m as calculated from the following:   Height as of this encounter: 5\' 6"  (1.676 m).   Weight as of this encounter: 213 lb 3.2 oz (96.7 kg).     Assessment:       ICD-10-CM   1. COPD, moderate ([place]) J44.9   2. Physical deconditioning R53.81   3. Hoarseness of voice R49.0        Plan:      Stable copd Glad uptodate with flu shot Deconditioning is post sepsis Hoarseness likely inhaler and aging related but will keep an eye  Plan Continue singulair, breo, spiriva scheduled with duoneb/albuterol as needed Try to get back to rehab asap If hoarseness persists will refer ENT  Followup 3-4 months or sooner if needed    Dr. Brand Males, [name], [facility].C.P Pulmonary and Critical Care Medicine Staff Physician, [place] Director - Interstitial Lung Disease  Program  Pulmonary [place] at [place], [place], [address]  Pager: [phone], If no answer or between  15:00h - 7:00h: call [phone] Telephone: [phone]

## 2017-11-02 ENCOUNTER — Encounter (HOSPITAL_COMMUNITY): Payer: Medicare Other

## 2017-11-07 ENCOUNTER — Encounter (HOSPITAL_COMMUNITY): Payer: Medicare Other

## 2017-11-09 ENCOUNTER — Encounter (HOSPITAL_COMMUNITY): Payer: Medicare Other

## 2017-11-12 ENCOUNTER — Other Ambulatory Visit: Payer: Self-pay | Admitting: Family Medicine

## 2017-11-12 DIAGNOSIS — E559 Vitamin D deficiency, unspecified: Secondary | ICD-10-CM

## 2017-11-12 DIAGNOSIS — E785 Hyperlipidemia, unspecified: Secondary | ICD-10-CM

## 2017-11-12 DIAGNOSIS — E538 Deficiency of other specified B group vitamins: Secondary | ICD-10-CM

## 2017-11-12 DIAGNOSIS — N183 Chronic kidney disease, stage 3 unspecified: Secondary | ICD-10-CM

## 2017-11-14 ENCOUNTER — Encounter (HOSPITAL_COMMUNITY): Payer: Medicare Other

## 2017-11-15 ENCOUNTER — Ambulatory Visit (INDEPENDENT_AMBULATORY_CARE_PROVIDER_SITE_OTHER): Payer: Medicare Other

## 2017-11-15 VITALS — BP 118/70 | HR 60 | Temp 98.1°F | Ht 65.5 in | Wt 214.5 lb

## 2017-11-15 DIAGNOSIS — E559 Vitamin D deficiency, unspecified: Secondary | ICD-10-CM

## 2017-11-15 DIAGNOSIS — N183 Chronic kidney disease, stage 3 unspecified: Secondary | ICD-10-CM

## 2017-11-15 DIAGNOSIS — E538 Deficiency of other specified B group vitamins: Secondary | ICD-10-CM | POA: Diagnosis not present

## 2017-11-15 DIAGNOSIS — Z0000 Encounter for general adult medical examination without abnormal findings: Secondary | ICD-10-CM | POA: Diagnosis not present

## 2017-11-15 DIAGNOSIS — E785 Hyperlipidemia, unspecified: Secondary | ICD-10-CM

## 2017-11-15 LAB — CBC WITH DIFFERENTIAL/PLATELET
Basophils Absolute: 0.1 10*3/uL (ref 0.0–0.1)
Basophils Relative: 1.3 % (ref 0.0–3.0)
Eosinophils Absolute: 0.1 10*3/uL (ref 0.0–0.7)
Eosinophils Relative: 2.2 % (ref 0.0–5.0)
HCT: 43.1 % (ref 36.0–46.0)
Hemoglobin: 14.5 g/dL (ref 12.0–15.0)
Lymphocytes Relative: 14.7 % (ref 12.0–46.0)
Lymphs Abs: 0.8 10*3/uL (ref 0.7–4.0)
MCHC: 33.6 g/dL (ref 30.0–36.0)
MCV: 95.3 fl (ref 78.0–100.0)
Monocytes Absolute: 0.5 10*3/uL (ref 0.1–1.0)
Monocytes Relative: 10.5 % (ref 3.0–12.0)
Neutro Abs: 3.7 10*3/uL (ref 1.4–7.7)
Neutrophils Relative %: 71.3 % (ref 43.0–77.0)
Platelets: 152 10*3/uL (ref 150.0–400.0)
RBC: 4.53 Mil/uL (ref 3.87–5.11)
RDW: 14 % (ref 11.5–15.5)
WBC: 5.2 10*3/uL (ref 4.0–10.5)

## 2017-11-15 LAB — LIPID PANEL
Cholesterol: 147 mg/dL (ref 0–200)
HDL: 51.2 mg/dL (ref >39.00)
LDL Cholesterol: 77 mg/dL (ref 0–99)
NonHDL: 95.84
Total CHOL/HDL Ratio: 3
Triglycerides: 95 mg/dL (ref 0.0–149.0)
VLDL: 19 mg/dL (ref 0.0–40.0)

## 2017-11-15 LAB — RENAL FUNCTION PANEL
Albumin: 4 g/dL (ref 3.5–5.2)
BUN: 20 mg/dL (ref 6–23)
CO2: 31 mEq/L (ref 19–32)
Calcium: 9.7 mg/dL (ref 8.4–10.5)
Chloride: 100 mEq/L (ref 96–112)
Creatinine, Ser: 1.1 mg/dL (ref 0.40–1.20)
GFR: 51.84 mL/min — ABNORMAL LOW (ref >60.00)
Glucose, Bld: 93 mg/dL (ref 70–99)
Phosphorus: 3.5 mg/dL (ref 2.3–4.6)
Potassium: 4.2 mEq/L (ref 3.5–5.1)
Sodium: 138 mEq/L (ref 135–145)

## 2017-11-15 LAB — VITAMIN D 25 HYDROXY (VIT D DEFICIENCY, FRACTURES): VITD: 34.22 ng/mL (ref 30.00–100.00)

## 2017-11-15 LAB — VITAMIN B12: Vitamin B-12: 715 pg/mL (ref 211–911)

## 2017-11-15 NOTE — Progress Notes (Signed)
Subjective:   [name] is a 72 y.o. female who presents for Medicare Annual (Subsequent) preventive examination.  Review of Systems:  N/A Cardiac Risk Factors include: advanced age (>10men, >25 women);obesity (BMI >30kg/m2);dyslipidemia     Objective:     Vitals: BP 118/70 (BP Location: Right Arm, Patient Position: Sitting, Cuff Size: Normal)   Pulse 60   Temp 98.1 F (36.7 C) (Oral)   Ht 5' 5.5" (1.664 m) Comment: no shoes  Wt 214 lb 8 oz (97.3 kg)   SpO2 96%   BMI 35.15 kg/m   Body mass index is 35.15 kg/m.  Advanced Directives 11/15/2017 08/23/2017 11/07/2016 06/15/2016 06/15/2016 06/09/2016 06/07/2016  Does Patient Have a Medical Advance Directive? Yes Yes Yes Yes Yes Yes Yes  Type of Paramedic of [place];Living will Healthcare Power of [place] of [place];Living will - [place];Living will [place];Living will  Does patient want to make changes to medical advance directive? - No - Patient declined - No - Patient declined - - No - Patient declined  Copy of [place] in Chart? No - copy requested No - copy requested No - copy requested No - copy requested - No - copy requested No - copy requested  Pre-existing out of facility DNR order (yellow form or pink MOST form) - - - - - - -    Tobacco Social History   Tobacco Use  Smoking Status Former Smoker  . Packs/day: 1.50  . Years: 10.00  . Pack years: 15.00  . Types: Cigarettes  . Last attempt to quit: 12/28/1974  . Years since quitting: 42.9  Smokeless Tobacco Never Used  Tobacco Comment   quit smoking in 1976     Counseling given: No Comment: quit smoking in 1976   Clinical Intake:  Pre-visit preparation completed: Yes  Pain : No/denies pain Pain Score: 0-No pain     Nutritional Status: BMI > 30  Obese Nutritional Risks: None Diabetes: No  How often do you need to have  someone help you when you read instructions, pamphlets, or other written materials from your doctor or pharmacy?: 1 - Never What is the last grade level you completed in school?: 12th grade + some college courses  Interpreter Needed?: No  Comments: pt is a widow and lives alone Information entered by :: LPinson, LPN  Past Medical History:  Diagnosis Date  . Acute cystitis without hematuria 12/22/2015  . Anemia   . Arthritis   . CAP (community acquired pneumonia) 02/08/2016  . Cataracts, bilateral    immature  . Chronic diastolic heart failure ([place]) 09/19/2014  . Chronic insomnia   . Constipation    takes Miralax daily as needed  . COPD (chronic obstructive pulmonary disease) (HCC)    Albuterol inhaler daily as needed;Duoneb daily as needed;Spiriva daily  . DDD (degenerative disc disease)    cervical - kyphosis with mod DD changes C5/6 and C6/7; lumbar - early DD at L2/3 ([place])  . Essential hypertension    was on meds but after appointment Dec 11 with Dr.C he took her off  . GERD (gastroesophageal reflux disease)    takes Protonix daily  . History of pneumonia 02/16/2016  . History of radiation therapy 05/30/11 to 07/07/11   rectum  . History of shingles   . Hyperlipemia    takes Lovastatin daily  . Legally blind in left eye, as defined in [place]   .  Lung nodule    RLL nodule-85mm stable 2006, April 2009, and June 2009  . Obesity   . Osteopenia 12/2014   T -1.5 hip  . Pancreatitis 11/2014   ?zpack related vs gallstone pancreatitis with abnormal HIDA scan pending cholecystectomy  . Rectal cancer ([place]) 04/2011   T3N0; s/p lap LAR, s/p ileostomy, s/p reversal, s/p chemo  . Research study patient 08/26/2016  . Rheumatic heart disease mitral stenosis    mod MS by echo 02/2014  . S/P AVR (aortic valve replacement) 2015   bioprosthetic ([place])  . S/P MVR (mitral valve replacement) 2015   bioprosthetic ([place])  . Seizures ([place])    febrile seizures as a child  . Severe aortic valve  stenosis  AVR 3/15 10/01/2013   mild-mod by echo 02/2014  . Sleep apnea    PT TOLD BORDERLINE-TRIED CPAP-DID NOT HELP-SHE DOES NOT USE CPAP  . Small bowel obstruction, partial ([place]) 08/2013   reolved without NGT placement.   . Vitamin B12 deficiency 02/28/2015   Start B12 shots 02/2015    Past Surgical History:  Procedure Laterality Date  . AORTIC VALVE REPLACEMENT N/A 12/12/2013   Procedure: AORTIC VALVE REPLACEMENT (AVR);  Surgeon: Gaye Pollack, MD; Service: Open Heart Surgery  . BACK SURGERY  2006, 2007   2006 SPACER, 2007 decompression and fusion L4/5  . BOWEL RESECTION  04/02/2012   Procedure: SMALL BOWEL RESECTION;  Surgeon: Stark Klein, MD;  Location: [facility];  Service: General;  Laterality: N/A;  . CARPAL TUNNEL RELEASE Bilateral   . CATARACT EXTRACTION W/ INTRAOCULAR LENS IMPLANT Left 10/18/2016  . CATARACT EXTRACTION W/ INTRAOCULAR LENS IMPLANT Right 12/13/2016   Dr. Kathrin Penner  . CHOLECYSTECTOMY N/A 01/21/2015   chronic cholecystitis, Stark Klein, MD  . COLON RESECTION  08/25/2011   Procedure: COLON RESECTION LAPAROSCOPIC;  Surgeon: Stark Klein, MD;  Location: [facility];  Service: General;  Laterality: N/A;  Laparoscopic Assisted Low Anterior Resection Diverting Ostomy and onQ pain pump  . COLONOSCOPY  03/2013   1 polyp, rpt 3 yrs Ardis Hughs)  . COLONOSCOPY WITH PROPOFOL N/A 06/09/2016   patent colo-colonic anastomosis, rpt 5 yrs Ardis Hughs)  . EP IMPLANTABLE DEVICE N/A 06/16/2016   Procedure: Pacemaker Implant;  Surgeon: Will Meredith Leeds, MD;  Location: [facility];  Service: Cardiovascular;  Laterality: N/A;  . FOOT SURGERY  left foot   hammer toe  . HAND SURGERY  Bil   carpal tunnel  . ILEOSTOMY  08/25/2011  . ILEOSTOMY CLOSURE  04/02/2012   Procedure: ILEOSTOMY TAKEDOWN;  Surgeon: Stark Klein, MD;  Location: [facility];  Service: General;  Laterality: N/A;  . INTRAOPERATIVE TRANSESOPHAGEAL ECHOCARDIOGRAM N/A 12/12/2013   Procedure: INTRAOPERATIVE TRANSESOPHAGEAL  ECHOCARDIOGRAM;  Surgeon: Gaye Pollack, MD;  Location: [facility];  Service: Open Heart Surgery;  Laterality: N/A;  . LEFT AND RIGHT HEART CATHETERIZATION WITH CORONARY ANGIOGRAM N/A 11/27/2013   Procedure: LEFT AND RIGHT HEART CATHETERIZATION WITH CORONARY ANGIOGRAM;  Surgeon: Sanda Klein, MD;  Location: [facility];  Service: Cardiovascular;  Laterality: N/A;  . MITRAL VALVE REPLACEMENT N/A 12/12/2013   Procedure: MITRAL VALVE (MV) REPLACEMENT OR REPAIR;  Surgeon: Gaye Pollack, MD; Service: Open Heart Surgery  . TEE WITHOUT CARDIOVERSION N/A 11/29/2013   Procedure: TRANSESOPHAGEAL ECHOCARDIOGRAM (TEE);  Surgeon: Sueanne Margarita, MD;  Location: [facility];  Service: Cardiovascular;  Laterality: N/A;  . TONSILLECTOMY     Family History  Problem Relation Age of Onset  . Lung cancer Father   . Heart  disease Mother   . Diabetes Mother   . Hypertension Mother   . Stroke Mother   . CAD Son 43  . Heart attack Son 40  . Heart attack Maternal Grandfather   . Breast cancer Maternal Aunt 83  . Colon cancer Neg Hx   . Esophageal cancer Neg Hx   . Stomach cancer Neg Hx   . Rectal cancer Neg Hx    Social History   Socioeconomic History  . Marital status: Widowed    Spouse name: None  . Number of children: None  . Years of education: None  . Highest education level: None  Social Needs  . Financial resource strain: None  . Food insecurity - worry: None  . Food insecurity - inability: None  . Transportation needs - medical: None  . Transportation needs - non-medical: None  Occupational History  . Occupation: retired  Tobacco Use  . Smoking status: Former Smoker    Packs/day: 1.50    Years: 10.00    Pack years: 15.00    Types: Cigarettes    Last attempt to quit: 12/28/1974    Years since quitting: 42.9  . Smokeless tobacco: Never Used  . Tobacco comment: quit smoking in Jan 10, 1975  Substance and Sexual Activity  . Alcohol use: Yes    Alcohol/week: 1.2 oz    Types: 2 Glasses of wine per  week  . Drug use: No  . Sexual activity: No  Other Topics Concern  . None  Social History Narrative   Widow. Husband died 04/12/2015 MVA. 1 cat.    2 sons; 1 grandchild. One son died of MI 82   Married January 09, 1965   Retired-[employer], volunteers at [employer]   Activity: pulm rehab   Diet: some water, fruits/vegetables daily    Outpatient Encounter Medications as of 11/15/2017  Medication Sig  . acetaminophen (TYLENOL) 325 MG tablet Take 325 mg by mouth as needed for mild pain or headache (2 tablets as needed).  Marland Kitchen albuterol (PROVENTIL HFA;VENTOLIN HFA) 108 (90 Base) MCG/ACT inhaler Inhale 2 puffs into the lungs every 6 (six) hours as needed for wheezing.  Marland Kitchen amoxicillin (AMOXIL) 500 MG capsule take 4 capsules by mouth 30-60 MINUTES PRIOR TO DENTAL APPT  . aspirin 81 MG tablet Take 81 mg by mouth at bedtime.   Marland Kitchen BIOTIN PO Take 1 tablet by mouth at bedtime.   . bisoprolol (ZEBETA) 5 MG tablet take 1/2 tablet by mouth once daily (Patient taking differently: take 1/2 tablet(2.5mg ) by mouth once daily)  . Calcium Carbonate-Vitamin D (CALCIUM 600 + D PO) Take 600 mg by mouth at bedtime.   . Cholecalciferol (VITAMIN D) 2000 UNITS CAPS Take 2,000 Units by mouth at bedtime.   . fluticasone furoate-vilanterol (BREO ELLIPTA) 200-25 MCG/INH AEPB Inhale 1 puff into the lungs daily.  . furosemide (LASIX) 40 MG tablet take 1 tablet by mouth once daily 5 TIMES A WEEK  . ipratropium-albuterol (DUONEB) 0.5-2.5 (3) MG/3ML SOLN Take 3 mLs by nebulization every 6 (six) hours as needed. (Patient taking differently: Take 3 mLs by nebulization every 6 (six) hours as needed (for wheezing or shortness of breath). )  . lovastatin (MEVACOR) 40 MG tablet take 1 tablet by mouth at bedtime  . Miconazole Nitrate 2 % POWD Apply 1 application topically daily as needed (for skin). Apply to stomach and under breasts  . montelukast (SINGULAIR) 10 MG tablet take 1 tablet by mouth at bedtime  . polycarbophil (FIBERCON) 625 MG tablet Take 625 mg by  mouth daily.  . polyethylene glycol (MIRALAX / GLYCOLAX) packet Take 17 g by mouth daily.  . potassium chloride (K-DUR) 10 MEQ tablet take 2 tablets by mouth once daily  . SPIRIVA HANDIHALER 18 MCG inhalation capsule inhale the contents of one capsule in the handihaler once daily  . traMADol (ULTRAM) 50 MG tablet Take 0.5-1 tablets (25-50 mg total) by mouth 2 (two) times daily as needed for moderate pain.  . vitamin B-12 (CYANOCOBALAMIN) 500 MCG tablet Take 500 mcg by mouth daily.   No facility-administered encounter medications on file as of 11/15/2017.     Activities of Daily Living In your present state of health, do you have any difficulty performing the following activities: 11/15/2017 08/23/2017  Hearing? N Y  Vision? N N  Difficulty concentrating or making decisions? N N  Walking or climbing stairs? Y N  Comment SOB when climbing stairs; back pain when walking long distance -  Dressing or bathing? N N  Doing errands, shopping? N N  Preparing Food and eating ? N -  Using the Toilet? N -  In the past six months, have you accidently leaked urine? Y -  Comment pad daily -  Do you have problems with loss of bowel control? N -  Managing your Medications? N -  Managing your Finances? N -  Housekeeping or managing your Housekeeping? N -  Some recent data might be hidden    Patient Care Team: Ria Bush, MD as PCP - General (Family Medicine) [name], MD as Consulting Physician (Gastroenterology) Kyung Rudd, MD as Consulting Physician (Radiation Oncology) Odogwu, Genevie Cheshire, MD as Consulting Physician (Hematology and Oncology) Terance Ice, MD as Consulting Physician (Cardiology) Brand Males, MD as Consulting Physician (Pulmonary Disease) Stark Klein, MD (General Surgery) Constance Haw, MD as Consulting Physician (Cardiology) Sanda Klein, MD as Consulting Physician (Cardiology) Shon Hough, MD as Consulting Physician  (Ophthalmology) Martinique, Amy, MD as Consulting Physician (Dermatology)    Assessment:   This is a routine wellness examination for [name].   Hearing Screening   125Hz  250Hz  500Hz  1000Hz  2000Hz  3000Hz  4000Hz  6000Hz  8000Hz   Right ear:   40 40 40  0    Left ear:   40 40 40  0    Vision Screening Comments: Last vision exam in 2018  Exercise Activities and Dietary recommendations Current Exercise Habits: The patient does not participate in regular exercise at present, Exercise limited by: None identified  Goals    . Increase physical activity     Starting March 2019, I resume pulmonary rehab for at least 60 minutes twice weekly.        Fall Risk Fall Risk  11/15/2017 11/14/2016 11/07/2016 10/28/2015 10/15/2014  Falls in the past year? No No No No Yes  Number falls in past yr: - - - - 1  Injury with Fall? - - - - No  Risk for fall due to : - - - - Other (Comment)  Risk for fall due to: Comment - - - - Had been sick and passed out due to N/V   Depression Screen PHQ 2/9 Scores 11/15/2017 11/07/2016 10/28/2015 10/15/2014  PHQ - 2 Score 2 0 0 0  PHQ- 9 Score 10 - - -     Cognitive Function MMSE - Mini Mental State Exam 11/15/2017 11/07/2016  Orientation to time 5 5  Orientation to Place 5 5  Registration 3 3  Attention/ Calculation 0 0  Recall 3 3  Language- name 2 objects  0 0  Language- repeat 1 1  Language- follow 3 step command 3 3  Language- read & follow direction 0 0  Write a sentence 0 0  Copy design 0 0  Total score 20 20     PLEASE NOTE: A Mini-Cog screen was completed. Maximum score is 20. A value of 0 denotes this part of [place] MMSE was not completed or the patient failed this part of the Mini-Cog screening.   Mini-Cog Screening Orientation to Time - Max 5 pts Orientation to Place - Max 5 pts Registration - Max 3 pts Recall - Max 3 pts Language Repeat - Max 1 pts Language Follow 3 Step Command - Max 3 pts     Immunization History  Administered Date(s) Administered   . H1N1 09/17/2008  . Influenza Split 07/18/2012  . Influenza Whole 08/10/2005, 06/24/2009, 07/11/2011  . Influenza, High Dose Seasonal PF 07/04/2016, 07/04/2017  . Influenza-Unspecified 07/24/2013, 06/30/2014, 06/30/2015  . Pneumococcal Conjugate-13 06/02/2014  . Pneumococcal Polysaccharide-23 08/10/2005, 04/21/2008, 06/25/2013  . Td 10/11/2005  . Zoster 09/16/2014    Screening Tests Health Maintenance  Topic Date Due  . DTaP/Tdap/Td (1 - Tdap) 10/10/2025 (Originally 10/12/2005)  . TETANUS/TDAP  10/10/2025 (Originally 10/12/2015)  . MAMMOGRAM  02/14/2019  . COLONOSCOPY  06/10/2019  . INFLUENZA VACCINE  Completed  . DEXA SCAN  Completed  . Hepatitis C Screening  Completed  . PNA vac Low Risk Adult  Completed       Plan:     I have personally reviewed, addressed, and noted the following in the patient's chart:  A. Medical and social history B. Use of alcohol, tobacco or illicit drugs  C. Current medications and supplements D. Functional ability and status E.  Nutritional status F.  Physical activity G. Advance directives H. List of other physicians I.  Hospitalizations, surgeries, and ER visits in previous 12 months J.  Green to include hearing, vision, cognitive, depression L. Referrals and appointments - none  In addition, I have reviewed and discussed with patient certain preventive protocols, quality metrics, and best practice recommendations. A written personalized care plan for preventive services as well as general preventive health recommendations were provided to patient.  See attached scanned questionnaire for additional information.   Signed,   Lindell Noe, MHA, BS, LPN Health Coach

## 2017-11-15 NOTE — Progress Notes (Signed)
PCP notes:   Health maintenance:  No gaps identified.   Abnormal screenings:   Hearing - failed  Hearing Screening   125Hz  250Hz  500Hz  1000Hz  2000Hz  3000Hz  4000Hz  6000Hz  8000Hz   Right ear:   40 40 40  0    Left ear:   40 40 40  0     Depression: 10 Depression screen [facility] 2/9 11/15/2017 11/07/2016 10/28/2015  Decreased Interest 1 0 0  Down, Depressed, Hopeless 1 0 0  PHQ - 2 Score 2 0 0  Altered sleeping 3 - -  Tired, decreased energy 2 - -  Change in appetite 3 - -  Feeling bad or failure about yourself  0 - -  Trouble concentrating 0 - -  Moving slowly or fidgety/restless 0 - -  Suicidal thoughts 0 - -  PHQ-9 Score 10 - -  Difficult doing work/chores Somewhat difficult - -  Some recent data might be hidden   Patient concerns:   None  Nurse concerns:  None  Next PCP appt:   11/22/17 @ 1030

## 2017-11-15 NOTE — Patient Instructions (Signed)
Ms. Pepperman , Thank you for taking time to come for your Medicare Wellness Visit. I appreciate your ongoing commitment to your health goals. Please review the following plan we discussed and let me know if I can assist you in the future.   These are the goals we discussed: Goals    . Increase physical activity     Starting March 2019, I resume pulmonary rehab for at least 60 minutes twice weekly.        This is a list of the screening recommended for you and due dates:  Health Maintenance  Topic Date Due  . DTaP/Tdap/Td vaccine (1 - Tdap) 10/10/2025*  . Tetanus Vaccine  10/10/2025*  . Mammogram  02/14/2019  . Colon Cancer Screening  06/10/2019  . Flu Shot  Completed  . DEXA scan (bone density measurement)  Completed  .  Hepatitis C: One time screening is recommended by Center for Disease Control  (CDC) for  adults born from [birth year] through 1966.   Completed  . Pneumonia vaccines  Completed  *Topic was postponed. The date shown is not the original due date.   Preventive Care for Adults  A healthy lifestyle and preventive care can promote health and wellness. Preventive health guidelines for adults include the following key practices.  . A routine yearly physical is a good way to check with your health care provider about your health and preventive screening. It is a chance to share any concerns and updates on your health and to receive a thorough exam.  . Visit your dentist for a routine exam and preventive care every 6 months. Brush your teeth twice a day and floss once a day. Good oral hygiene prevents tooth decay and gum disease.  . The frequency of eye exams is based on your age, health, family medical history, use  of contact lenses, and other factors. Follow your health care provider's recommendations for frequency of eye exams.  . Eat a healthy diet. Foods like vegetables, fruits, whole grains, low-fat dairy products, and lean protein foods contain the nutrients you need  without too many calories. Decrease your intake of foods high in solid fats, added sugars, and salt. Eat the right amount of calories for you. Get information about a proper diet from your health care provider, if necessary.  . Regular physical exercise is one of the most important things you can do for your health. Most adults should get at least 150 minutes of moderate-intensity exercise (any activity that increases your heart rate and causes you to sweat) each week. In addition, most adults need muscle-strengthening exercises on 2 or more days a week.  Silver Sneakers may be a benefit available to you. To determine eligibility, you may visit the website: www.silversneakers.com or contact program at [phone] Mon-Fri between 8AM-8PM.   . Maintain a healthy weight. The body mass index (BMI) is a screening tool to identify possible weight problems. It provides an estimate of body fat based on height and weight. Your health care provider can find your BMI and can help you achieve or maintain a healthy weight.   For adults 20 years and older: ? A BMI below 18.5 is considered underweight. ? A BMI of 18.5 to 24.9 is normal. ? A BMI of 25 to 29.9 is considered overweight. ? A BMI of 30 and above is considered obese.   . Maintain normal blood lipids and cholesterol levels by exercising and minimizing your intake of saturated fat. Eat a balanced diet with plenty  of fruit and vegetables. Blood tests for lipids and cholesterol should begin at age 76 and be repeated every 5 years. If your lipid or cholesterol levels are high, you are over 50, or you are at high risk for heart disease, you may need your cholesterol levels checked more frequently. Ongoing high lipid and cholesterol levels should be treated with medicines if diet and exercise are not working.  . If you smoke, find out from your health care provider how to quit. If you do not use tobacco, please do not start.  . If you choose to drink  alcohol, please do not consume more than 2 drinks per day. One drink is considered to be 12 ounces (355 mL) of beer, 5 ounces (148 mL) of wine, or 1.5 ounces (44 mL) of liquor.  . If you are 23-33 years old, ask your health care provider if you should take aspirin to prevent strokes.  . Use sunscreen. Apply sunscreen liberally and repeatedly throughout the day. You should seek shade when your shadow is shorter than you. Protect yourself by wearing long sleeves, pants, a wide-brimmed hat, and sunglasses year round, whenever you are outdoors.  . Once a month, do a whole body skin exam, using a mirror to look at the skin on your back. Tell your health care provider of new moles, moles that have irregular borders, moles that are larger than a pencil eraser, or moles that have changed in shape or color.

## 2017-11-15 NOTE — Progress Notes (Signed)
Pre visit review using our clinic review tool, if applicable. No additional management support is needed unless otherwise documented below in the visit note. 

## 2017-11-16 ENCOUNTER — Encounter (HOSPITAL_COMMUNITY): Payer: Medicare Other

## 2017-11-19 NOTE — Progress Notes (Signed)
I reviewed health advisor's note, was available for consultation, and agree with documentation and plan.  

## 2017-11-21 ENCOUNTER — Encounter (HOSPITAL_COMMUNITY): Payer: Medicare Other

## 2017-11-22 ENCOUNTER — Ambulatory Visit (INDEPENDENT_AMBULATORY_CARE_PROVIDER_SITE_OTHER): Payer: Medicare Other | Admitting: Family Medicine

## 2017-11-22 ENCOUNTER — Encounter: Payer: Self-pay | Admitting: Family Medicine

## 2017-11-22 VITALS — BP 118/70 | HR 65 | Temp 98.0°F | Ht 66.0 in | Wt 211.0 lb

## 2017-11-22 DIAGNOSIS — Z7189 Other specified counseling: Secondary | ICD-10-CM | POA: Diagnosis not present

## 2017-11-22 DIAGNOSIS — E669 Obesity, unspecified: Secondary | ICD-10-CM

## 2017-11-22 DIAGNOSIS — F4321 Adjustment disorder with depressed mood: Secondary | ICD-10-CM

## 2017-11-22 DIAGNOSIS — M545 Low back pain, unspecified: Secondary | ICD-10-CM

## 2017-11-22 DIAGNOSIS — Z0000 Encounter for general adult medical examination without abnormal findings: Secondary | ICD-10-CM

## 2017-11-22 DIAGNOSIS — J449 Chronic obstructive pulmonary disease, unspecified: Secondary | ICD-10-CM

## 2017-11-22 DIAGNOSIS — N3946 Mixed incontinence: Secondary | ICD-10-CM

## 2017-11-22 DIAGNOSIS — N183 Chronic kidney disease, stage 3 unspecified: Secondary | ICD-10-CM

## 2017-11-22 DIAGNOSIS — M8580 Other specified disorders of bone density and structure, unspecified site: Secondary | ICD-10-CM

## 2017-11-22 DIAGNOSIS — E785 Hyperlipidemia, unspecified: Secondary | ICD-10-CM

## 2017-11-22 DIAGNOSIS — I10 Essential (primary) hypertension: Secondary | ICD-10-CM

## 2017-11-22 DIAGNOSIS — G8929 Other chronic pain: Secondary | ICD-10-CM

## 2017-11-22 DIAGNOSIS — I5032 Chronic diastolic (congestive) heart failure: Secondary | ICD-10-CM

## 2017-11-22 DIAGNOSIS — E66811 Obesity, class 1: Secondary | ICD-10-CM

## 2017-11-22 MED ORDER — TRAMADOL HCL 50 MG PO TABS: 25.0000 mg | ORAL_TABLET | Freq: Two times a day (BID) | ORAL | 0 refills | Status: DC | PRN

## 2017-11-22 MED ORDER — MIRABEGRON ER 25 MG PO TB24: 25.0000 mg | ORAL_TABLET | Freq: Every day | ORAL | 1 refills | Status: DC

## 2017-11-22 NOTE — Assessment & Plan Note (Addendum)
Chronic, worsening. Mixed incontinence. Reviewed stable UA from 08/2017. No UTI sxs.  Tried multiple antimuscarinics.  Will Rx myrbetriq 25mg  daily - if not effective pt interested in urology referral.

## 2017-11-22 NOTE — Assessment & Plan Note (Signed)
Preventative protocols reviewed and updated unless pt declined. Discussed healthy diet and lifestyle.  

## 2017-11-22 NOTE — Assessment & Plan Note (Signed)
Chronic, stable. Continue current regime. 

## 2017-11-22 NOTE — Assessment & Plan Note (Signed)
Followed by pulm. Planning to restart pulm rehab.

## 2017-11-22 NOTE — Patient Instructions (Addendum)
Trial myrbetriq once daily for urinary incontinence. If no better, we will refer you to urologist. Let me know. 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 We have refilled tramadol for you. Consider physical therapy.  If interested, check with pharmacy about new 2 shot shingles series (shingrix).  Return as needed or in 6 months for follow up visit.   Health Maintenance, Female Adopting a healthy lifestyle and getting preventive care can go a long way to promote health and wellness. Talk with your health care provider about what schedule of regular examinations is right for you. This is a good chance for you to check in with your provider about disease prevention and staying healthy. In between checkups, there are plenty of things you can do on your own. Experts have done a lot of research about which lifestyle changes and preventive measures are most likely to keep you healthy. Ask your health care provider for more information. Weight and diet Eat a healthy diet  Be sure to include plenty of vegetables, fruits, low-fat dairy products, and lean protein.  Do not eat a lot of foods high in solid fats, added sugars, or salt.  Get regular exercise. This is one of the most important things you can do for your health. ? Most adults should exercise for at least 150 minutes each week. The exercise should increase your heart rate and make you sweat (moderate-intensity exercise). ? Most adults should also do strengthening exercises at least twice a week. This is in addition to the moderate-intensity exercise.  Maintain a healthy weight  Body mass index (BMI) is a measurement that can be used to identify possible weight problems. It estimates body fat based on height and weight. Your health care provider can help determine your BMI and help you achieve or maintain a healthy weight.  For females 10 years of age and older: ? A BMI below 18.5 is considered underweight. ? A BMI of 18.5 to 24.9 is normal. ? A BMI  of 25 to 29.9 is considered overweight. ? A BMI of 30 and above is considered obese.  Watch levels of cholesterol and blood lipids  You should start having your blood tested for lipids and cholesterol at 73 years of age, then have this test every 5 years.  You may need to have your cholesterol levels checked more often if: ? Your lipid or cholesterol levels are high. ? You are older than 73 years of age. ? You are at high risk for heart disease.  Cancer screening Lung Cancer  Lung cancer screening is recommended for adults 46-30 years old who are at high risk for lung cancer because of a history of smoking.  A yearly low-dose CT scan of the lungs is recommended for people who: ? Currently smoke. ? Have quit within the past 15 years. ? Have at least a 30-pack-year history of smoking. A pack year is smoking an average of one pack of cigarettes a day for 1 year.  Yearly screening should continue until it has been 15 years since you quit.  Yearly screening should stop if you develop a health problem that would prevent you from having lung cancer treatment.  Breast Cancer  Practice breast self-awareness. This means understanding how your breasts normally appear and feel.  It also means doing regular breast self-exams. Let your health care provider know about any changes, no matter how small.  If you are in your 20s or 30s, you should have a clinical breast exam (CBE) by a  health care provider every 1-3 years as part of a regular health exam.  If you are 60 or older, have a CBE every year. Also consider having a breast X-ray (mammogram) every year.  If you have a family history of breast cancer, talk to your health care provider about genetic screening.  If you are at high risk for breast cancer, talk to your health care provider about having an MRI and a mammogram every year.  Breast cancer gene (BRCA) assessment is recommended for women who have family members with BRCA-related  cancers. BRCA-related cancers include: ? Breast. ? Ovarian. ? Tubal. ? Peritoneal cancers.  Results of the assessment will determine the need for genetic counseling and BRCA1 and BRCA2 testing.  Cervical Cancer Your health care provider may recommend that you be screened regularly for cancer of the pelvic organs (ovaries, uterus, and vagina). This screening involves a pelvic examination, including checking for microscopic changes to the surface of your cervix (Pap test). You may be encouraged to have this screening done every 3 years, beginning at age 77.  For women ages 27-65, health care providers may recommend pelvic exams and Pap testing every 3 years, or they may recommend the Pap and pelvic exam, combined with testing for human papilloma virus (HPV), every 5 years. Some types of HPV increase your risk of cervical cancer. Testing for HPV may also be done on women of any age with unclear Pap test results.  Other health care providers may not recommend any screening for nonpregnant women who are considered low risk for pelvic cancer and who do not have symptoms. Ask your health care provider if a screening pelvic exam is right for you.  If you have had past treatment for cervical cancer or a condition that could lead to cancer, you need Pap tests and screening for cancer for at least 20 years after your treatment. If Pap tests have been discontinued, your risk factors (such as having a new sexual partner) need to be reassessed to determine if screening should resume. Some women have medical problems that increase the chance of getting cervical cancer. In these cases, your health care provider may recommend more frequent screening and Pap tests.  Colorectal Cancer  This type of cancer can be detected and often prevented.  Routine colorectal cancer screening usually begins at 73 years of age and continues through 73 years of age.  Your health care provider may recommend screening at an  earlier age if you have risk factors for colon cancer.  Your health care provider may also recommend using home test kits to check for hidden blood in the stool.  A small camera at the end of a tube can be used to examine your colon directly (sigmoidoscopy or colonoscopy). This is done to check for the earliest forms of colorectal cancer.  Routine screening usually begins at age 56.  Direct examination of the colon should be repeated every 5-10 years through 73 years of age. However, you may need to be screened more often if early forms of precancerous polyps or small growths are found.  Skin Cancer  Check your skin from head to toe regularly.  Tell your health care provider about any new moles or changes in moles, especially if there is a change in a mole's shape or color.  Also tell your health care provider if you have a mole that is larger than the size of a pencil eraser.  Always use sunscreen. Apply sunscreen liberally and repeatedly throughout  the day.  Protect yourself by wearing long sleeves, pants, a wide-brimmed hat, and sunglasses whenever you are outside.  Heart disease, diabetes, and high blood pressure  High blood pressure causes heart disease and increases the risk of stroke. High blood pressure is more likely to develop in: ? People who have blood pressure in the high end of the normal range (130-139/85-89 mm Hg). ? People who are overweight or obese. ? People who are African American.  If you are 70-1 years of age, have your blood pressure checked every 3-5 years. If you are 28 years of age or older, have your blood pressure checked every year. You should have your blood pressure measured twice-once when you are at a hospital or clinic, and once when you are not at a hospital or clinic. Record the average of the two measurements. To check your blood pressure when you are not at a hospital or clinic, you can use: ? An automated blood pressure machine at a  pharmacy. ? A home blood pressure monitor.  If you are between 44 years and 40 years old, ask your health care provider if you should take aspirin to prevent strokes.  Have regular diabetes screenings. This involves taking a blood sample to check your fasting blood sugar level. ? If you are at a normal weight and have a low risk for diabetes, have this test once every three years after 73 years of age. ? If you are overweight and have a high risk for diabetes, consider being tested at a younger age or more often. Preventing infection Hepatitis B  If you have a higher risk for hepatitis B, you should be screened for this virus. You are considered at high risk for hepatitis B if: ? You were born in a country where hepatitis B is common. Ask your health care provider which countries are considered high risk. ? Your parents were born in a high-risk country, and you have not been immunized against hepatitis B (hepatitis B vaccine). ? You have HIV or AIDS. ? You use needles to inject street drugs. ? You live with someone who has hepatitis B. ? You have had sex with someone who has hepatitis B. ? You get hemodialysis treatment. ? You take certain medicines for conditions, including cancer, organ transplantation, and autoimmune conditions.  Hepatitis C  Blood testing is recommended for: ? Everyone born from [birth year] through 1966. ? Anyone with known risk factors for hepatitis C.  Sexually transmitted infections (STIs)  You should be screened for sexually transmitted infections (STIs) including gonorrhea and chlamydia if: ? You are sexually active and are younger than 73 years of age. ? You are older than 73 years of age and your health care provider tells you that you are at risk for this type of infection. ? Your sexual activity has changed since you were last screened and you are at an increased risk for chlamydia or gonorrhea. Ask your health care provider if you are at risk.  If you do not  have HIV, but are at risk, it may be recommended that you take a prescription medicine daily to prevent HIV infection. This is called pre-exposure prophylaxis (PrEP). You are considered at risk if: ? You are sexually active and do not regularly use condoms or know the HIV status of your partner(s). ? You take drugs by injection. ? You are sexually active with a partner who has HIV.  Talk with your health care provider about whether you are at  high risk of being infected with HIV. If you choose to begin PrEP, you should first be tested for HIV. You should then be tested every 3 months for as long as you are taking PrEP. Pregnancy  If you are premenopausal and you may become pregnant, ask your health care provider about preconception counseling.  If you may become pregnant, take 400 to 800 micrograms (mcg) of folic acid every day.  If you want to prevent pregnancy, talk to your health care provider about birth control (contraception). Osteoporosis and menopause  Osteoporosis is a disease in which the bones lose minerals and strength with aging. This can result in serious bone fractures. Your risk for osteoporosis can be identified using a bone density scan.  If you are 60 years of age or older, or if you are at risk for osteoporosis and fractures, ask your health care provider if you should be screened.  Ask your health care provider whether you should take a calcium or vitamin D supplement to lower your risk for osteoporosis.  Menopause may have certain physical symptoms and risks.  Hormone replacement therapy may reduce some of these symptoms and risks. Talk to your health care provider about whether hormone replacement therapy is right for you. Follow these instructions at home:  Schedule regular health, dental, and eye exams.  Stay current with your immunizations.  Do not use any tobacco products including cigarettes, chewing tobacco, or electronic cigarettes.  If you are pregnant,  do not drink alcohol.  If you are breastfeeding, limit how much and how often you drink alcohol.  Limit alcohol intake to no more than 1 drink per day for nonpregnant women. One drink equals 12 ounces of beer, 5 ounces of wine, or 1 ounces of hard liquor.  Do not use street drugs.  Do not share needles.  Ask your health care provider for help if you need support or information about quitting drugs.  Tell your health care provider if you often feel depressed.  Tell your health care provider if you have ever been abused or do not feel safe at home. This information is not intended to replace advice given to you by your health care provider. Make sure you discuss any questions you have with your health care provider. Document Released: 04/11/2011 Document Revised: 03/03/2016 Document Reviewed: 06/30/2015 Elsevier Interactive Patient Education  [employer].

## 2017-11-22 NOTE — Assessment & Plan Note (Signed)
Bump in last few months. Encouraged good hydration status.

## 2017-11-22 NOTE — Assessment & Plan Note (Signed)
Reviewed dietary calcium intake

## 2017-11-22 NOTE — Assessment & Plan Note (Addendum)
Advanced directives: scanned and in chart 10/2014. Has living will at home. HCPOA is husband (deceased) then son. Has updated at home, asked to bring [place] copy.

## 2017-11-22 NOTE — Assessment & Plan Note (Signed)
Chronic, stable on statin. The 10-year ASCVD risk score Mikey Bussing DC Brooke Bonito., et al., 2013) is: 12.6%   Values used to calculate the score:     Age: 72 years     Sex: Female     Is Non-Hispanic African American: No     Diabetic: No     Tobacco smoker: No     Systolic Blood Pressure: 834 mmHg     Is BP treated: Yes     HDL Cholesterol: 51.2 mg/dL     Total Cholesterol: 147 mg/dL

## 2017-11-22 NOTE — Assessment & Plan Note (Signed)
To restart pulm rehab

## 2017-11-22 NOTE — Assessment & Plan Note (Signed)
Stable period. Ongoing trouble. Declines medication.

## 2017-11-22 NOTE — Progress Notes (Signed)
BP 118/70 (BP Location: Left Arm, Patient Position: Sitting, Cuff Size: Large)   Pulse 65   Temp 98 F (36.7 C) (Oral)   Ht 5\' 6"  (1.676 m)   Wt 211 lb (95.7 kg)   SpO2 95%   BMI 34.06 kg/m    CC: CPE Subjective:    Patient ID: [name], female    DOB: 1945-11-18, 72 y.o.   MRN: [id number]  HPI: [name] is a 72 y.o. female presenting on 11/22/2017 for Annual Exam (Pt 2.)   Saw Katha Cabal last week for medicare wellness visit. Note reviewed.  PHQ9 = 10   She did have hospitalization late last year with urosepsis.  Struggling over worsening mixed urinary incontinence that is chronic - especially bad with fluid pill. Unable to leave home when she takes her lasix (5 days a week). She has tried kegel exercises without improvement.  Ongoing, worsening lower back pain more noticeable with upright, better with sitting and resting for even 30 seconds. Requests tramadol refill. Has seen neurosurgeon Dr Ellene Route - no further intervention options.  To restart pulm rehab 2018-01-26.  Preventative: COLONOSCOPY WITH PROPOFOL 06/09/2016 patent colo-colonic anastomosis, rpt 5 yrs Ardis Hughs) Well woman - none recently. Had normal paps in past and would like to age out of cervical cancer screening. Discussed other GYN cancers to watch for. Mammogram 02/2017 WNL.  DEXA - 01/2015. T score -1.5. Doesn't drink milk. Eats yogurt every morning.  Flu shot yearly  Pnuemovax 06/2013, prevnar 05/2014 Td 01-26-2006, deferred for now  zostavax - 09/2014 shingrix - discussed Advanced directives: scanned and in chart 10/2014. Has living will at home. HCPOA is husband (deceased) then son. Has updated at home, asked to bring [place] copy.  Seat belt use discussed Sunscreen use discussed. No changing moles on skin.  Non smoker Alcohol - 1 glass wine a few nights a week  Widow. Husband died 29-Apr-2015 MVA. 1 cat.  2 sons; 1 grandchild. One son died of MI 48 Married 01-26-1965  Retired-[employer], volunteers at [employer], to start at  [employer] in [place] Activity: pulm rehab  Diet: some water, fruits/vegetables daily, yogurt daily  Relevant past medical, surgical, family and social history reviewed and updated as indicated. Interim medical history since our last visit reviewed. Allergies and medications reviewed and updated. Outpatient Medications Prior to Visit  Medication Sig Dispense Refill  . acetaminophen (TYLENOL) 325 MG tablet Take 325 mg by mouth as needed for mild pain or headache (2 tablets as needed).    Marland Kitchen albuterol (PROVENTIL HFA;VENTOLIN HFA) 108 (90 Base) MCG/ACT inhaler Inhale 2 puffs into the lungs every 6 (six) hours as needed for wheezing. 1 Inhaler 2  . amoxicillin (AMOXIL) 500 MG capsule take 4 capsules by mouth 30-60 MINUTES PRIOR TO DENTAL APPT 4 capsule 12  . aspirin 81 MG tablet Take 81 mg by mouth at bedtime.     Marland Kitchen BIOTIN PO Take 1 tablet by mouth at bedtime.     . bisoprolol (ZEBETA) 5 MG tablet take 1/2 tablet by mouth once daily (Patient taking differently: take 1/2 tablet(2.5mg ) by mouth once daily) 15 tablet 5  . Calcium Carbonate-Vitamin D (CALCIUM 600 + D PO) Take 600 mg by mouth at bedtime.     . Cholecalciferol (VITAMIN D) 2000 UNITS CAPS Take 2,000 Units by mouth at bedtime.     . fluticasone furoate-vilanterol (BREO ELLIPTA) 200-25 MCG/INH AEPB Inhale 1 puff into the lungs daily. 60 each 8  . furosemide (LASIX) 40  MG tablet take 1 tablet by mouth once daily 5 TIMES A WEEK 20 tablet 5  . ipratropium-albuterol (DUONEB) 0.5-2.5 (3) MG/3ML SOLN Take 3 mLs by nebulization every 6 (six) hours as needed. (Patient taking differently: Take 3 mLs by nebulization every 6 (six) hours as needed (for wheezing or shortness of breath). ) 360 mL 0  . lovastatin (MEVACOR) 40 MG tablet take 1 tablet by mouth at bedtime 30 tablet 5  . Miconazole Nitrate 2 % POWD Apply 1 application topically daily as needed (for skin). Apply to stomach and under breasts    . montelukast (SINGULAIR) 10 MG tablet take 1  tablet by mouth at bedtime 30 tablet 5  . polycarbophil (FIBERCON) 625 MG tablet Take 625 mg by mouth daily.    . polyethylene glycol (MIRALAX / GLYCOLAX) packet Take 17 g by mouth daily. 14 each 0  . potassium chloride (K-DUR) 10 MEQ tablet take 2 tablets by mouth once daily 60 tablet 5  . SPIRIVA HANDIHALER 18 MCG inhalation capsule inhale the contents of one capsule in the handihaler once daily 30 capsule 5  . vitamin B-12 (CYANOCOBALAMIN) 500 MCG tablet Take 500 mcg by mouth daily.    . traMADol (ULTRAM) 50 MG tablet Take 0.5-1 tablets (25-50 mg total) by mouth 2 (two) times daily as needed for moderate pain.  0   No facility-administered medications prior to visit.      Per HPI unless specifically indicated in ROS section below Review of Systems  Constitutional: Negative for activity change, appetite change, chills, fatigue, fever and unexpected weight change.  HENT: Negative for hearing loss.   Eyes: Negative for visual disturbance.  Respiratory: Negative for cough, chest tightness, shortness of breath and wheezing.   Cardiovascular: Negative for chest pain, palpitations and leg swelling.  Gastrointestinal: Negative for abdominal distention, abdominal pain, blood in stool, constipation, diarrhea, nausea and vomiting.  Genitourinary: Positive for frequency and urgency. Negative for difficulty urinating and hematuria.  Musculoskeletal: Positive for back pain. Negative for arthralgias, myalgias and neck pain.  Skin: Negative for rash.  Neurological: Negative for dizziness, seizures, syncope and headaches.  Hematological: Negative for adenopathy. Does not bruise/bleed easily.  Psychiatric/Behavioral: Negative for dysphoric mood. The patient is not nervous/anxious.        Objective:    BP 118/70 (BP Location: Left Arm, Patient Position: Sitting, Cuff Size: Large)   Pulse 65   Temp 98 F (36.7 C) (Oral)   Ht 5\' 6"  (1.676 m)   Wt 211 lb (95.7 kg)   SpO2 95%   BMI 34.06 kg/m   Wt  Readings from Last 3 Encounters:  11/22/17 211 lb (95.7 kg)  11/15/17 214 lb 8 oz (97.3 kg)  10/31/17 213 lb 3.2 oz (96.7 kg)    Physical Exam  Constitutional: She is oriented to person, place, and time. She appears well-developed and well-nourished. No distress.  HENT:  Head: Normocephalic and atraumatic.  Right Ear: Hearing, tympanic membrane, external ear and ear canal normal.  Left Ear: Hearing, tympanic membrane, external ear and ear canal normal.  Nose: Nose normal.  Mouth/Throat: Uvula is midline, oropharynx is clear and moist and mucous membranes are normal. No oropharyngeal exudate, posterior oropharyngeal edema or posterior oropharyngeal erythema.  Eyes: Conjunctivae and EOM are normal. Pupils are equal, round, and reactive to light. No scleral icterus.  Neck: Normal range of motion. Neck supple. Carotid bruit is not present. No thyromegaly present.  Cardiovascular: Normal rate, regular rhythm, normal heart sounds and intact  distal pulses.  No murmur heard. Pulses:      Radial pulses are 2+ on the right side, and 2+ on the left side.  Pulmonary/Chest: Effort normal and breath sounds normal. No respiratory distress. She has no wheezes. She has no rales.  Abdominal: Soft. Bowel sounds are normal. She exhibits no distension and no mass. There is no tenderness. There is no rebound and no guarding.  Musculoskeletal: Normal range of motion. She exhibits no edema.  Lymphadenopathy:    She has no cervical adenopathy.  Neurological: She is alert and oriented to person, place, and time.  CN grossly intact, station and gait intact  Skin: Skin is warm and dry. No rash noted.  Psychiatric: She has a normal mood and affect. Her behavior is normal. Judgment and thought content normal.  Nursing note and vitals reviewed.  Results for orders placed or performed in visit on 11/15/17  CBC with Differential/Platelet  Result Value Ref Range   WBC 5.2 4.0 - 10.5 K/uL   RBC 4.53 3.87 - 5.11  Mil/uL   Hemoglobin 14.5 12.0 - 15.0 g/dL   HCT 43.1 36.0 - 46.0 %   MCV 95.3 78.0 - 100.0 fl   MCHC 33.6 30.0 - 36.0 g/dL   RDW 14.0 11.5 - 15.5 %   Platelets 152.0 150.0 - 400.0 K/uL   Neutrophils Relative % 71.3 43.0 - 77.0 %   Lymphocytes Relative 14.7 12.0 - 46.0 %   Monocytes Relative 10.5 3.0 - 12.0 %   Eosinophils Relative 2.2 0.0 - 5.0 %   Basophils Relative 1.3 0.0 - 3.0 %   Neutro Abs 3.7 1.4 - 7.7 K/uL   Lymphs Abs 0.8 0.7 - 4.0 K/uL   Monocytes Absolute 0.5 0.1 - 1.0 K/uL   Eosinophils Absolute 0.1 0.0 - 0.7 K/uL   Basophils Absolute 0.1 0.0 - 0.1 K/uL  Renal function panel  Result Value Ref Range   Sodium 138 135 - 145 mEq/L   Potassium 4.2 3.5 - 5.1 mEq/L   Chloride 100 96 - 112 mEq/L   CO2 31 19 - 32 mEq/L   Calcium 9.7 8.4 - 10.5 mg/dL   Albumin 4.0 3.5 - 5.2 g/dL   BUN 20 6 - 23 mg/dL   Creatinine, Ser 1.10 0.40 - 1.20 mg/dL   Glucose, Bld 93 70 - 99 mg/dL   Phosphorus 3.5 2.3 - 4.6 mg/dL   GFR 51.84 (L) >60.00 mL/min  Lipid panel  Result Value Ref Range   Cholesterol 147 0 - 200 mg/dL   Triglycerides 95.0 0.0 - 149.0 mg/dL   HDL 51.20 >39.00 mg/dL   VLDL 19.0 0.0 - 40.0 mg/dL   LDL Cholesterol 77 0 - 99 mg/dL   Total CHOL/HDL Ratio 3    NonHDL 95.84   VITAMIN D 25 Hydroxy (Vit-D Deficiency, Fractures)  Result Value Ref Range   VITD 34.22 30.00 - 100.00 ng/mL  Vitamin B12  Result Value Ref Range   Vitamin B-12 715 211 - 911 pg/mL   *Note: Due to a large number of results and/or encounters for the requested time period, some results have not been displayed. A complete set of results can be found in Results Review.      Assessment & Plan:   Problem List Items Addressed This Visit    Adjustment disorder with depressed mood    Stable period. Ongoing trouble. Declines medication.       Advanced care planning/counseling discussion    Advanced directives: scanned and in  chart 10/2014. Has living will at home. HCPOA is husband (deceased) then son. Has  updated at home, asked to bring [place] copy.       Chronic diastolic heart failure (HCC)   Chronic lumbar pain    Refilled tramadol. Anticipate component of lumbar stenosis. Discussed possible PT course.       Relevant Medications   traMADol (ULTRAM) 50 MG tablet   CKD (chronic kidney disease) stage 3, GFR 30-59 ml/min (HCC)    Bump in last few months. Encouraged good hydration status.       COPD (chronic obstructive pulmonary disease) (HCC)    Followed by pulm. Planning to restart pulm rehab.      Essential hypertension, benign    Chronic, stable. Continue current regime.       Health maintenance examination - Primary    Preventative protocols reviewed and updated unless pt declined. Discussed healthy diet and lifestyle.       HLD (hyperlipidemia)    Chronic, stable on statin. The 10-year ASCVD risk score Mikey Bussing DC Brooke Bonito., et al., 2013) is: 12.6%   Values used to calculate the score:     Age: 30 years     Sex: Female     Is Non-Hispanic African American: No     Diabetic: No     Tobacco smoker: No     Systolic Blood Pressure: 101 mmHg     Is BP treated: Yes     HDL Cholesterol: 51.2 mg/dL     Total Cholesterol: 147 mg/dL       Mixed stress and urge urinary incontinence    Chronic, worsening. Mixed incontinence. Reviewed stable UA from 08/2017. No UTI sxs.  Tried multiple antimuscarinics.  Will Rx myrbetriq 25mg  daily - if not effective pt interested in urology referral.       Relevant Medications   mirabegron ER (MYRBETRIQ) 25 MG TB24 tablet   Obesity, Class I, BMI 30-34.9    To restart pulm rehab       Osteopenia    Reviewed dietary calcium intake          Meds ordered this encounter  Medications  . traMADol (ULTRAM) 50 MG tablet    Sig: Take 0.5-1 tablets (25-50 mg total) by mouth 2 (two) times daily as needed for moderate pain.    Dispense:  30 tablet    Refill:  0  . mirabegron ER (MYRBETRIQ) 25 MG TB24 tablet    Sig: Take 1 tablet (25 mg total) by  mouth daily.    Dispense:  30 tablet    Refill:  1   No orders of the defined types were placed in this encounter.   Follow up plan: Return in about 6 months (around 05/22/2018) for follow up visit.  Ria Bush, MD

## 2017-11-22 NOTE — Assessment & Plan Note (Signed)
Refilled tramadol. Anticipate component of lumbar stenosis. Discussed possible PT course.

## 2017-11-23 ENCOUNTER — Encounter (HOSPITAL_COMMUNITY): Payer: Medicare Other

## 2017-11-23 ENCOUNTER — Telehealth: Payer: Self-pay | Admitting: Internal Medicine

## 2017-11-23 DIAGNOSIS — J449 Chronic obstructive pulmonary disease, unspecified: Secondary | ICD-10-CM

## 2017-11-23 NOTE — Telephone Encounter (Signed)
lmtcb x1 for [name] with pulmonary rehab.

## 2017-11-24 ENCOUNTER — Telehealth: Payer: Self-pay | Admitting: Family Medicine

## 2017-11-24 DIAGNOSIS — M545 Low back pain, unspecified: Secondary | ICD-10-CM

## 2017-11-24 DIAGNOSIS — G8929 Other chronic pain: Secondary | ICD-10-CM

## 2017-11-24 NOTE — Telephone Encounter (Signed)
Copied from [place]. Topic: Quick Communication - See Telephone Encounter >> Nov 24, 2017  4:46 PM Percell Belt A wrote: CRM for notification. See Telephone encounter for:   pt called and said that pharmacy only filled 14 of the 30  traMADol (ULTRAM) 50 MG tablet [[id number]] .  They told her she would have to contact her dr office to get the remaining ?    Pharmacy -[facility] on [address]    11/24/17.

## 2017-11-27 NOTE — Telephone Encounter (Signed)
Called pulm rehab, [name] does not work on Monday. Advised staff that I would place order since MR gave [place] the ok.   Order has been placed. Nothing else needed at time of call.

## 2017-11-27 NOTE — Telephone Encounter (Signed)
Patient was in the maintenance program for pulmonary rehab and the patient dropped out, they are ready to start again. MR are you ok with [place] sending in another order for this. Thanks.

## 2017-11-27 NOTE — Telephone Encounter (Signed)
Yes please go ahead and order copd maintenance rehab. Dx copd  Dr. Brand Males, [name], [facility].C.P Pulmonary and Critical Care Medicine Staff Physician, [place] Director - Interstitial Lung Disease  Program  [facility] [place] [facility] [place], [place], [address]  Pager: [phone], If no answer or between  15:00h - 7:00h: call [phone] Telephone: [phone]

## 2017-11-28 ENCOUNTER — Encounter: Payer: Self-pay | Admitting: Family Medicine

## 2017-11-28 ENCOUNTER — Encounter (HOSPITAL_COMMUNITY): Payer: Medicare Other

## 2017-11-29 NOTE — Telephone Encounter (Signed)
Yes please

## 2017-11-29 NOTE — Telephone Encounter (Signed)
Spoke with [place] about tramadol rx.  States pt's ins co only covers 1 wk of rx and a PA will be needed for anything more. Do you want me to do the PA?

## 2017-11-30 ENCOUNTER — Encounter (HOSPITAL_COMMUNITY): Payer: Medicare Other

## 2017-11-30 NOTE — Telephone Encounter (Signed)
Started PA, key: [id number].  Received message that medication is on plan's list of covered drugs. Says PA is not required.  Spoke with [facility] relaying this info and was told that federal law only allows them to fill a 7 day rx.  Will call ins co to find out what needs to be done.

## 2017-11-30 NOTE — Telephone Encounter (Addendum)
Spoke with [facility] [employer] asking about tramadol rx.  Says they will fax a form requesting additional info needed for pt to have full rx requested.

## 2017-12-05 ENCOUNTER — Encounter (HOSPITAL_COMMUNITY): Payer: Medicare Other

## 2017-12-07 ENCOUNTER — Encounter (HOSPITAL_COMMUNITY): Payer: Medicare Other

## 2017-12-08 NOTE — Assessment & Plan Note (Signed)
Has seen Dr Ellene Route - no further intervention options

## 2017-12-08 NOTE — Telephone Encounter (Signed)
Received faxed form for tramadol PA. Placed in Dr. Synthia Innocent box.

## 2017-12-08 NOTE — Telephone Encounter (Signed)
Filled and in Lisa's box 

## 2017-12-11 ENCOUNTER — Telehealth (HOSPITAL_COMMUNITY): Payer: Self-pay | Admitting: *Deleted

## 2017-12-11 NOTE — Telephone Encounter (Signed)
Faxed Tramadol PA.

## 2017-12-12 ENCOUNTER — Encounter (HOSPITAL_COMMUNITY): Payer: Medicare Other

## 2017-12-12 ENCOUNTER — Encounter (HOSPITAL_COMMUNITY): Payer: Self-pay

## 2017-12-12 DIAGNOSIS — Z952 Presence of prosthetic heart valve: Secondary | ICD-10-CM | POA: Insufficient documentation

## 2017-12-12 DIAGNOSIS — I5032 Chronic diastolic (congestive) heart failure: Secondary | ICD-10-CM | POA: Insufficient documentation

## 2017-12-12 DIAGNOSIS — I441 Atrioventricular block, second degree: Secondary | ICD-10-CM | POA: Insufficient documentation

## 2017-12-14 ENCOUNTER — Encounter (HOSPITAL_COMMUNITY): Payer: Self-pay

## 2017-12-19 ENCOUNTER — Encounter (HOSPITAL_COMMUNITY): Payer: Self-pay

## 2017-12-21 ENCOUNTER — Encounter (HOSPITAL_COMMUNITY): Payer: Self-pay

## 2017-12-26 ENCOUNTER — Encounter (HOSPITAL_COMMUNITY): Payer: Self-pay

## 2017-12-27 ENCOUNTER — Other Ambulatory Visit: Payer: Self-pay | Admitting: Family Medicine

## 2017-12-27 ENCOUNTER — Ambulatory Visit (INDEPENDENT_AMBULATORY_CARE_PROVIDER_SITE_OTHER): Payer: Medicare Other | Admitting: Cardiovascular Disease

## 2017-12-27 ENCOUNTER — Encounter: Payer: Self-pay | Admitting: Cardiovascular Disease

## 2017-12-27 VITALS — BP 118/68 | HR 63 | Ht 66.0 in | Wt 206.8 lb

## 2017-12-27 DIAGNOSIS — I059 Rheumatic mitral valve disease, unspecified: Secondary | ICD-10-CM

## 2017-12-27 DIAGNOSIS — R001 Bradycardia, unspecified: Secondary | ICD-10-CM | POA: Diagnosis not present

## 2017-12-27 DIAGNOSIS — I5032 Chronic diastolic (congestive) heart failure: Secondary | ICD-10-CM | POA: Diagnosis not present

## 2017-12-27 DIAGNOSIS — J449 Chronic obstructive pulmonary disease, unspecified: Secondary | ICD-10-CM

## 2017-12-27 DIAGNOSIS — I359 Nonrheumatic aortic valve disorder, unspecified: Secondary | ICD-10-CM | POA: Diagnosis not present

## 2017-12-27 DIAGNOSIS — Z952 Presence of prosthetic heart valve: Secondary | ICD-10-CM | POA: Diagnosis not present

## 2017-12-27 DIAGNOSIS — E669 Obesity, unspecified: Secondary | ICD-10-CM | POA: Diagnosis not present

## 2017-12-27 DIAGNOSIS — E7800 Pure hypercholesterolemia, unspecified: Secondary | ICD-10-CM

## 2017-12-27 DIAGNOSIS — I441 Atrioventricular block, second degree: Secondary | ICD-10-CM | POA: Diagnosis not present

## 2017-12-27 DIAGNOSIS — Z1231 Encounter for screening mammogram for malignant neoplasm of breast: Secondary | ICD-10-CM

## 2017-12-27 NOTE — Patient Instructions (Addendum)
Dr Sallyanne Kuster recommends that you continue on your current medications as directed. Please refer to the Current Medication list given to you today.  Your physician has requested that you have an echocardiogram in 1 year. Echocardiography is a painless test that uses sound waves to create images of your heart. It provides your doctor with information about the size and shape of your heart and how well your heart's chambers and valves are working. This procedure takes approximately one hour. There are no restrictions for this procedure. >>This will be performed at our [facility] location [place], [place] [address] [phone]  Remote monitoring is used to monitor your Pacemaker or ICD from home. This monitoring reduces the number of office visits required to check your device to one time per year. It allows [place] to keep an eye on the functioning of your device to ensure it is working properly. You are scheduled for a device check from home on Tuesday, April 16th, 2019. You may send your transmission at any time that day. If you have a wireless device, the transmission will be sent automatically. After your physician reviews your transmission, you will receive a notification with your next transmission date.  Dr Sallyanne Kuster recommends that you schedule a follow-up appointment in 12 months with a pacemaker check. You will receive a reminder letter in the mail two months in advance. If you don't receive a letter, please call our office to schedule the follow-up appointment.  If you need a refill on your cardiac medications before your next appointment, please call your pharmacy.

## 2017-12-27 NOTE — Progress Notes (Signed)
Patient ID: [name], female   DOB: May 26, 1946, 72 y.o.   MRN: [id number] Patient ID: [name], female   DOB: 11/17/1945, 72 y.o.   MRN: [id number]     Cardiology Office Note    Date:  12/27/2017   ID:  MARRI MCNEFF, DOB [birth year], MRN [id number]  PCP:  Ria Bush, MD  Cardiologist:   Sanda Klein, MD   Chief Complaint  Patient presents with  . Follow-up    pt reports no complaints    History of Present Illness:  [name] is a 72 y.o. female with rheumatic valvular heart disease, COPD, morbid obesity, hyperlipidemia, about 4 years after combined AVR/MVR (bioprostheses).  She was hospitalized with urosepsis in November and it took a very long time to recover from this.  Her back to pulmonary rehab down with influenza.  She is over that as well and is planning to return to pulmonary rehab tomorrow.  Pacemaker checked today shows normal device function. There is 47% atrial pacing and 60*% ventricular pacing and there have been no episodes of atrial fibrillation or ventricular tachycardia. A single brief 7-beat run of PAT is seen only.  The heart rate histogram appears appropriate for her level of activity.  The patient specifically denies any chest pain at rest or exertion, dyspnea at rest, orthopnea, paroxysmal nocturnal dyspnea, syncope, palpitations, focal neurological deficits, intermittent claudication, lower extremity edema, unexplained weight gain, cough, hemoptysis or wheezing.  He has functional class II exertional dyspnea, baseline.  If she skips her furosemide more than a day, she does develop mild ankle swelling.   The aortic valve prosthesis is a 21 mm [employer] MagnaEase pericardial tissue valve and the mitral valve is a 25 mm device of the same make.  Surgery was on December 12, 2013 with Dr. Cyndia Bent  Echo 09/18/2015:  - Left ventricle: The cavity size was normal. There was mild focal basal hypertrophy of the septum. Systolic function  was vigorous.The estimated ejection fraction was in the range of 65% to 70%. There was dynamic obstruction. Peak velocity - 4.6m/s peakvelocity with Valsalva, peak gradient 3mmHg. Wall motion was normal; there were no regional wall motion abnormalities. - Aortic valve: A bioprosthesis was present and functioningnormally. Transvalvular velocity was minimally increased. There was no stenosis. Peak velocity (S): 224 cm/s. Peak gradient (S):20 mm Hg. - Mitral valve: Severely calcified annulus. A bioprosthesis waspresent and functioning normally. The findings are consistentwith mild to moderate stenosis. Mean gradient (D): 7 mm Hg. Peakgradient (D): 11 mm Hg. Valve area by pressure half-time: 1.76cm^2. - Left atrium: The atrium was moderately dilated.  Echo 06/29/2016:  - Left ventricle: The cavity size was normal. Wall thickness was increased in a pattern of mild LVH. Systolic function was normal.   The estimated ejection fraction was in the range of 60% to 65%. Indeterminant diastolic function. Wall motion was normal; there   were no regional wall motion abnormalities.  - Aortic valve: There was a bioprosthetic aortic valve. There did not appear to be significant regurgitation or stenosis. Mean   gradient (S): 17 mm Hg. - Mitral valve: There was a bioprosthetic mitral valve. There did not appear to be significant stenosis or regurgitation. The   residual native mitral apparatus is severely calcified. Mean gradient (D): 6 mm Hg. Valve area by pressure half-time: 1.8 cm^2. - Left atrium: The atrium was mildly dilated. - Right ventricle: The cavity size was normal. Systolic function was normal. - Tricuspid valve: Peak RV-RA  gradient (S): 23 mm Hg. - Pulmonary arteries: PA peak pressure: 26 mm Hg (S). - Inferior vena cava: The vessel was normal in size. The respirophasic diameter changes were normal   Past Medical History:  Diagnosis Date  . Acute cystitis without hematuria 12/22/2015  .  Anemia   . Arthritis   . CAP (community acquired pneumonia) 02/08/2016  . Cataracts, bilateral    immature  . Chronic diastolic heart failure ([place]) 09/19/2014  . Chronic insomnia   . Constipation    takes Miralax daily as needed  . COPD (chronic obstructive pulmonary disease) (HCC)    Albuterol inhaler daily as needed;Duoneb daily as needed;Spiriva daily  . DDD (degenerative disc disease)    cervical - kyphosis with mod DD changes C5/6 and C6/7; lumbar - early DD at L2/3 ([place])  . Essential hypertension    was on meds but after appointment Dec 11 with Dr.C he took her off  . GERD (gastroesophageal reflux disease)    takes Protonix daily  . History of pneumonia 02/16/2016  . History of radiation therapy 05/30/11 to 07/07/11   rectum  . History of shingles   . Hyperlipemia    takes Lovastatin daily  . Legally blind in left eye, as defined in [place]   . Lung nodule    RLL nodule-72mm stable 2006, April 2009, and June 2009  . Obesity   . Osteopenia 12/2014   T -1.5 hip  . Pancreatitis 11/2014   ?zpack related vs gallstone pancreatitis with abnormal HIDA scan pending cholecystectomy  . Rectal cancer ([place]) 04/2011   T3N0; s/p lap LAR, s/p ileostomy, s/p reversal, s/p chemo  . Research study patient 08/26/2016  . Rheumatic heart disease mitral stenosis    mod MS by echo 02/2014  . S/P AVR (aortic valve replacement) 2015   bioprosthetic ([place])  . S/P MVR (mitral valve replacement) 2015   bioprosthetic ([place])  . Seizures ([place])    febrile seizures as a child  . Sepsis secondary to UTI ([place]) 08/22/2017  . Severe aortic valve stenosis  AVR 3/15 10/01/2013   mild-mod by echo 02/2014  . Sleep apnea    PT TOLD BORDERLINE-TRIED CPAP-DID NOT HELP-SHE DOES NOT USE CPAP  . Small bowel obstruction, partial ([place]) 08/2013   reolved without NGT placement.   . Vitamin B12 deficiency 02/28/2015   Start B12 shots 02/2015     Past Surgical History:  Procedure Laterality Date  . AORTIC VALVE  REPLACEMENT N/A 12/12/2013   Procedure: AORTIC VALVE REPLACEMENT (AVR);  Surgeon: Gaye Pollack, MD; Service: Open Heart Surgery  . BACK SURGERY  2006, 2007   2006 SPACER, 2007 decompression and fusion L4/5  . BOWEL RESECTION  04/02/2012   Procedure: SMALL BOWEL RESECTION;  Surgeon: Stark Klein, MD;  Location: [facility];  Service: General;  Laterality: N/A;  . CARPAL TUNNEL RELEASE Bilateral   . CATARACT EXTRACTION W/ INTRAOCULAR LENS IMPLANT Left 10/18/2016  . CATARACT EXTRACTION W/ INTRAOCULAR LENS IMPLANT Right 12/13/2016   Dr. Kathrin Penner  . CHOLECYSTECTOMY N/A 01/21/2015   chronic cholecystitis, Stark Klein, MD  . COLON RESECTION  08/25/2011   Procedure: COLON RESECTION LAPAROSCOPIC;  Surgeon: Stark Klein, MD;  Location: [facility];  Service: General;  Laterality: N/A;  Laparoscopic Assisted Low Anterior Resection Diverting Ostomy and onQ pain pump  . COLONOSCOPY  03/2013   1 polyp, rpt 3 yrs Ardis Hughs)  . COLONOSCOPY WITH PROPOFOL N/A 06/09/2016   patent colo-colonic anastomosis, rpt 5 yrs Ardis Hughs)  . EP  IMPLANTABLE DEVICE N/A 06/16/2016   Procedure: Pacemaker Implant;  Surgeon: Will Meredith Leeds, MD;  Location: [facility];  Service: Cardiovascular;  Laterality: N/A;  . FOOT SURGERY  left foot   hammer toe  . HAND SURGERY  Bil   carpal tunnel  . ILEOSTOMY  08/25/2011  . ILEOSTOMY CLOSURE  04/02/2012   Procedure: ILEOSTOMY TAKEDOWN;  Surgeon: Stark Klein, MD;  Location: [facility];  Service: General;  Laterality: N/A;  . INTRAOPERATIVE TRANSESOPHAGEAL ECHOCARDIOGRAM N/A 12/12/2013   Procedure: INTRAOPERATIVE TRANSESOPHAGEAL ECHOCARDIOGRAM;  Surgeon: Gaye Pollack, MD;  Location: [facility];  Service: Open Heart Surgery;  Laterality: N/A;  . LEFT AND RIGHT HEART CATHETERIZATION WITH CORONARY ANGIOGRAM N/A 11/27/2013   Procedure: LEFT AND RIGHT HEART CATHETERIZATION WITH CORONARY ANGIOGRAM;  Surgeon: Sanda Klein, MD;  Location: [facility];  Service: Cardiovascular;  Laterality: N/A;  . MITRAL  VALVE REPLACEMENT N/A 12/12/2013   Procedure: MITRAL VALVE (MV) REPLACEMENT OR REPAIR;  Surgeon: Gaye Pollack, MD; Service: Open Heart Surgery  . TEE WITHOUT CARDIOVERSION N/A 11/29/2013   Procedure: TRANSESOPHAGEAL ECHOCARDIOGRAM (TEE);  Surgeon: Sueanne Margarita, MD;  Location: [facility];  Service: Cardiovascular;  Laterality: N/A;  . TONSILLECTOMY      Current Outpatient Medications  Medication Sig Dispense Refill  . acetaminophen (TYLENOL) 325 MG tablet Take 325 mg by mouth as needed for mild pain or headache (2 tablets as needed).    Marland Kitchen albuterol (PROVENTIL HFA;VENTOLIN HFA) 108 (90 Base) MCG/ACT inhaler Inhale 2 puffs into the lungs every 6 (six) hours as needed for wheezing. 1 Inhaler 2  . amoxicillin (AMOXIL) 500 MG capsule take 4 capsules by mouth 30-60 MINUTES PRIOR TO DENTAL APPT 4 capsule 12  . aspirin 81 MG tablet Take 81 mg by mouth at bedtime.     Marland Kitchen BIOTIN PO Take 1 tablet by mouth at bedtime.     . bisoprolol (ZEBETA) 5 MG tablet take 1/2 tablet by mouth once daily 15 tablet 5  . Calcium Carbonate-Vitamin D (CALCIUM 600 + D PO) Take 600 mg by mouth at bedtime.     . Cholecalciferol (VITAMIN D) 2000 UNITS CAPS Take 2,000 Units by mouth at bedtime.     . fluticasone furoate-vilanterol (BREO ELLIPTA) 200-25 MCG/INH AEPB Inhale 1 puff into the lungs daily. 60 each 8  . furosemide (LASIX) 40 MG tablet take 1 tablet by mouth once daily 5 TIMES A WEEK 20 tablet 5  . ipratropium-albuterol (DUONEB) 0.5-2.5 (3) MG/3ML SOLN Take 3 mLs by nebulization every 6 (six) hours as needed. 360 mL 0  . lovastatin (MEVACOR) 40 MG tablet take 1 tablet by mouth at bedtime 30 tablet 5  . Miconazole Nitrate 2 % POWD Apply 1 application topically daily as needed (for skin). Apply to stomach and under breasts    . mirabegron ER (MYRBETRIQ) 25 MG TB24 tablet Take 1 tablet (25 mg total) by mouth daily. 30 tablet 1  . montelukast (SINGULAIR) 10 MG tablet take 1 tablet by mouth at bedtime 30 tablet 5  .  polycarbophil (FIBERCON) 625 MG tablet Take 625 mg by mouth daily.    . polyethylene glycol (MIRALAX / GLYCOLAX) packet Take 17 g by mouth daily. 14 each 0  . potassium chloride (K-DUR) 10 MEQ tablet take 2 tablets by mouth once daily 60 tablet 5  . SPIRIVA HANDIHALER 18 MCG inhalation capsule inhale the contents of one capsule in the handihaler once daily 30 capsule 5  . traMADol (ULTRAM) 50 MG  tablet Take 0.5-1 tablets (25-50 mg total) by mouth 2 (two) times daily as needed for moderate pain. 30 tablet 0  . vitamin B-12 (CYANOCOBALAMIN) 500 MCG tablet Take 500 mcg by mouth daily.     No current facility-administered medications for this visit.     Allergies:   Sulfa antibiotics; Codeine; Prednisone; and Sulfonamide derivatives   Social History   Socioeconomic History  . Marital status: Widowed    Spouse name: None  . Number of children: None  . Years of education: None  . Highest education level: None  Social Needs  . Financial resource strain: None  . Food insecurity - worry: None  . Food insecurity - inability: None  . Transportation needs - medical: None  . Transportation needs - non-medical: None  Occupational History  . Occupation: retired  Tobacco Use  . Smoking status: Former Smoker    Packs/day: 1.50    Years: 10.00    Pack years: 15.00    Types: Cigarettes    Last attempt to quit: 12/28/1974    Years since quitting: 43.0  . Smokeless tobacco: Never Used  . Tobacco comment: quit smoking in January 17, 1975  Substance and Sexual Activity  . Alcohol use: Yes    Alcohol/week: 1.2 oz    Types: 2 Glasses of wine per week  . Drug use: No  . Sexual activity: No  Other Topics Concern  . None  Social History Narrative   Widow. Husband died 04/19/15 MVA. 1 cat.    2 sons; 1 grandchild. One son died of MI 73   Married 1965-01-16   Retired-[employer], volunteers at [employer]   Activity: pulm rehab   Diet: some water, fruits/vegetables daily     Family History:  The patient's family history  includes Breast cancer (age of onset: 4) in her maternal aunt; CAD (age of onset: 76) in her son; Diabetes in her mother; Heart attack in her maternal grandfather; Heart attack (age of onset: 49) in her son; Heart disease in her mother; Hypertension in her mother; Lung cancer in her father; Stroke in her mother.   ROS:   Please see the history of present illness.    Review of Systems  All other systems reviewed and are negative.  All other systems reviewed and are negative.   PHYSICAL EXAM:   VS:  BP 118/68   Pulse 63   Ht 5\' 6"  (1.676 m)   Wt 206 lb 12.8 oz (93.8 kg)   BMI 33.38 kg/m     General: Alert, oriented x3, no distress, obese Head: no evidence of trauma, PERRL, EOMI, no exophtalmos or lid lag, no myxedema, no xanthelasma; normal ears, nose and oropharynx Neck: normal jugular venous pulsations and no hepatojugular reflux; brisk carotid pulses without delay and no carotid bruits Chest: clear to auscultation, no signs of consolidation by percussion or palpation, normal fremitus, symmetrical and full respiratory excursions Cardiovascular: normal position and quality of the apical impulse, regular rhythm, 2/6 early peaking aortic ejection murmur; no diastolic murmurs, rubs, or gallops,no edema . Healthy left subclavian pacemaker site Abdomen: no tenderness or distention, no masses by palpation, no abnormal pulsatility or arterial bruits, normal bowel sounds, no hepatosplenomegaly Extremities: no clubbing, cyanosis or edema; 2+ radial, ulnar and brachial pulses bilaterally; 2+ right femoral, posterior tibial and dorsalis pedis pulses; 2+ left femoral, posterior tibial and dorsalis pedis pulses; no subclavian or femoral bruits Neurological: grossly nonfocal Psych: Normal mood and affect   Wt Readings from Last 3 Encounters:  12/27/17 206 lb 12.8 oz (93.8 kg)  11/22/17 211 lb (95.7 kg)  11/15/17 214 lb 8 oz (97.3 kg)      Studies/Labs Reviewed:   EKG:  EKG is ordered today.   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 Chronic diastolic heart failure ([place])   2. S/P aortic and mitral valve bioprostheses - 12/2013   3. 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 Mild obesity   5. Chronic obstructive pulmonary disease, unspecified COPD type ([place])   6. Hypercholesterolemia   7. Mitral valve disorder   8. Aortic valve disorder      PLAN:  In order of problems listed above:  1. Chronic diastolic heart failure (HCC) , NYHA class 2: Contains functional class II, no evidence of hypervolemia on exam on a low-dose of intermittently dosed diuretic 2. AVR/MVR: No clinical signs of valve dysfunction, will plan a follow-up echo next year 3. 2nd deg AVB: Despite VIP programming she still has roughly 60% ventricular pacing, but this does not appear to have significant feels. 4. Obesity: Weight loss has been difficult, but she is trying 5. COPD: Tomorrow is her day to return to pulmonary rehabilitation. 6. HLP: on statin, at [facility].   Medication Adjustments/Labs and Tests Ordered: Current medicines are reviewed at length with the patient today.  Concerns regarding medicines are outlined above.  Medication changes, Labs and Tests ordered today are listed below. Patient Instructions  Dr Sallyanne Kuster recommends that you continue on your current medications as directed. Please refer to the  Current Medication list given to you today.  Your physician has requested that you have an echocardiogram in 1 year. Echocardiography is a painless test that uses sound waves to create images of your heart. It provides your doctor with information about the size and shape of your heart and how well your heart's chambers and valves are working. This procedure takes approximately one hour. There are no restrictions for this procedure. >>This will be performed at our [facility] location [place], [place] [address] [phone]  Remote monitoring is used to monitor your Pacemaker or ICD from home. This monitoring reduces the number of office visits required to check your device to one time per year. It allows [place] to keep an eye on the functioning of your device to ensure it is working properly. You are scheduled for a device check from home on Tuesday, April 16th, 2019. You may send your transmission at any time that day. If you have a wireless device, the transmission will be sent automatically. After your physician reviews your transmission, you will receive a notification with your next transmission date.  Dr Sallyanne Kuster recommends that you schedule a follow-up appointment in 12 months with a pacemaker check. You will receive a reminder letter in the mail two months in advance. If you don't receive a letter, please  call our office to schedule the follow-up appointment.  If you need a refill on your cardiac medications before your next appointment, please call your pharmacy.     Signed, Sanda Klein, MD  12/27/2017 9:39 AM    [facility] [place], [place], [place]  [address] Phone: [phone]; Fax: [phone]

## 2017-12-28 ENCOUNTER — Encounter (HOSPITAL_COMMUNITY)
Admission: RE | Admit: 2017-12-28 | Discharge: 2017-12-28 | Disposition: A | Discharge status: Home / Self Care | Payer: Self-pay | Source: Ambulatory Visit | Attending: Internal Medicine | Admitting: Internal Medicine

## 2018-01-02 ENCOUNTER — Encounter (HOSPITAL_COMMUNITY)
Admission: RE | Admit: 2018-01-02 | Discharge: 2018-01-02 | Disposition: A | Discharge status: Home / Self Care | Payer: Self-pay | Source: Ambulatory Visit | Attending: Internal Medicine | Admitting: Internal Medicine

## 2018-01-04 ENCOUNTER — Encounter (HOSPITAL_COMMUNITY)
Admission: RE | Admit: 2018-01-04 | Discharge: 2018-01-04 | Disposition: A | Discharge status: Home / Self Care | Payer: Self-pay | Source: Ambulatory Visit | Attending: Internal Medicine | Admitting: Internal Medicine

## 2018-01-09 ENCOUNTER — Encounter (HOSPITAL_COMMUNITY)
Admission: RE | Admit: 2018-01-09 | Discharge: 2018-01-09 | Disposition: A | Discharge status: Home / Self Care | Payer: Self-pay | Source: Ambulatory Visit | Attending: Internal Medicine | Admitting: Internal Medicine

## 2018-01-09 DIAGNOSIS — I5032 Chronic diastolic (congestive) heart failure: Secondary | ICD-10-CM | POA: Insufficient documentation

## 2018-01-09 DIAGNOSIS — I441 Atrioventricular block, second degree: Secondary | ICD-10-CM | POA: Insufficient documentation

## 2018-01-09 DIAGNOSIS — Z952 Presence of prosthetic heart valve: Secondary | ICD-10-CM | POA: Insufficient documentation

## 2018-01-11 ENCOUNTER — Encounter (HOSPITAL_COMMUNITY)
Admission: RE | Admit: 2018-01-11 | Discharge: 2018-01-11 | Disposition: A | Discharge status: Home / Self Care | Payer: Medicare Other | Source: Ambulatory Visit | Attending: Internal Medicine | Admitting: Internal Medicine

## 2018-01-16 ENCOUNTER — Encounter (HOSPITAL_COMMUNITY)
Admission: RE | Admit: 2018-01-16 | Discharge: 2018-01-16 | Disposition: A | Discharge status: Home / Self Care | Payer: Self-pay | Source: Ambulatory Visit | Attending: Internal Medicine | Admitting: Internal Medicine

## 2018-01-18 ENCOUNTER — Encounter (HOSPITAL_COMMUNITY)
Admission: RE | Admit: 2018-01-18 | Discharge: 2018-01-18 | Disposition: A | Discharge status: Home / Self Care | Payer: Self-pay | Source: Ambulatory Visit | Attending: Internal Medicine | Admitting: Internal Medicine

## 2018-01-21 ENCOUNTER — Other Ambulatory Visit: Payer: Self-pay | Admitting: Cardiovascular Disease

## 2018-01-21 ENCOUNTER — Other Ambulatory Visit: Payer: Self-pay | Admitting: Family Medicine

## 2018-01-21 ENCOUNTER — Other Ambulatory Visit: Payer: Self-pay | Admitting: Internal Medicine

## 2018-01-22 ENCOUNTER — Ambulatory Visit (INDEPENDENT_AMBULATORY_CARE_PROVIDER_SITE_OTHER): Payer: Medicare Other | Admitting: *Deleted

## 2018-01-22 ENCOUNTER — Ambulatory Visit (INDEPENDENT_AMBULATORY_CARE_PROVIDER_SITE_OTHER): Payer: Medicare Other | Admitting: Family Medicine

## 2018-01-22 ENCOUNTER — Encounter: Payer: Self-pay | Admitting: Family Medicine

## 2018-01-22 ENCOUNTER — Ambulatory Visit (INDEPENDENT_AMBULATORY_CARE_PROVIDER_SITE_OTHER)
Admission: RE | Admit: 2018-01-22 | Discharge: 2018-01-22 | Disposition: A | Discharge status: Home / Self Care | Payer: Medicare Other | Source: Ambulatory Visit | Attending: Family Medicine | Admitting: Family Medicine

## 2018-01-22 VITALS — BP 124/66 | HR 104 | Temp 99.0°F | Ht 66.0 in | Wt 199.0 lb

## 2018-01-22 DIAGNOSIS — N183 Chronic kidney disease, stage 3 unspecified: Secondary | ICD-10-CM

## 2018-01-22 DIAGNOSIS — R059 Cough, unspecified: Secondary | ICD-10-CM

## 2018-01-22 DIAGNOSIS — R5383 Other fatigue: Secondary | ICD-10-CM | POA: Diagnosis not present

## 2018-01-22 DIAGNOSIS — J181 Lobar pneumonia, unspecified organism: Secondary | ICD-10-CM | POA: Diagnosis not present

## 2018-01-22 DIAGNOSIS — R05 Cough: Secondary | ICD-10-CM

## 2018-01-22 DIAGNOSIS — R110 Nausea: Secondary | ICD-10-CM

## 2018-01-22 DIAGNOSIS — R001 Bradycardia, unspecified: Secondary | ICD-10-CM | POA: Diagnosis not present

## 2018-01-22 DIAGNOSIS — J441 Chronic obstructive pulmonary disease with (acute) exacerbation: Secondary | ICD-10-CM

## 2018-01-22 DIAGNOSIS — J189 Pneumonia, unspecified organism: Secondary | ICD-10-CM

## 2018-01-22 DIAGNOSIS — N3946 Mixed incontinence: Secondary | ICD-10-CM

## 2018-01-22 DIAGNOSIS — R5381 Other malaise: Secondary | ICD-10-CM

## 2018-01-22 HISTORY — DX: Other malaise: R53.81

## 2018-01-22 LAB — HEPATIC FUNCTION PANEL
ALT: 79 U/L — ABNORMAL HIGH (ref 0–35)
AST: 52 U/L — ABNORMAL HIGH (ref 0–37)
Albumin: 4.2 g/dL (ref 3.5–5.2)
Alkaline Phosphatase: 103 U/L (ref 39–117)
Bilirubin, Direct: 0.1 mg/dL (ref 0.0–0.3)
Total Bilirubin: 0.7 mg/dL (ref 0.2–1.2)
Total Protein: 7.8 g/dL (ref 6.0–8.3)

## 2018-01-22 LAB — POC URINALSYSI DIPSTICK (AUTOMATED)
Bilirubin, UA: NEGATIVE
Blood, UA: NEGATIVE
Glucose, UA: NEGATIVE
Nitrite, UA: NEGATIVE
Spec Grav, UA: 1.01 (ref 1.010–1.025)
Urobilinogen, UA: 0.2 E.U./dL
pH, UA: 7 (ref 5.0–8.0)

## 2018-01-22 LAB — BASIC METABOLIC PANEL
BUN: 16 mg/dL (ref 6–23)
CO2: 26 mEq/L (ref 19–32)
Calcium: 9.9 mg/dL (ref 8.4–10.5)
Chloride: 99 mEq/L (ref 96–112)
Creatinine, Ser: 1.1 mg/dL (ref 0.40–1.20)
GFR: 51.81 mL/min — ABNORMAL LOW (ref >60.00)
Glucose, Bld: 94 mg/dL (ref 70–99)
Potassium: 4.4 mEq/L (ref 3.5–5.1)
Sodium: 135 mEq/L (ref 135–145)

## 2018-01-22 LAB — CBC WITH DIFFERENTIAL/PLATELET
Basophils Absolute: 0 10*3/uL (ref 0.0–0.1)
Basophils Relative: 0.7 % (ref 0.0–3.0)
Eosinophils Absolute: 0.1 10*3/uL (ref 0.0–0.7)
Eosinophils Relative: 0.8 % (ref 0.0–5.0)
HCT: 48.6 % — ABNORMAL HIGH (ref 36.0–46.0)
Hemoglobin: 16.4 g/dL — ABNORMAL HIGH (ref 12.0–15.0)
Lymphocytes Relative: 7 % — ABNORMAL LOW (ref 12.0–46.0)
Lymphs Abs: 0.4 10*3/uL — ABNORMAL LOW (ref 0.7–4.0)
MCHC: 33.8 g/dL (ref 30.0–36.0)
MCV: 95.5 fl (ref 78.0–100.0)
Monocytes Absolute: 0.7 10*3/uL (ref 0.1–1.0)
Monocytes Relative: 11.5 % (ref 3.0–12.0)
Neutro Abs: 5.1 10*3/uL (ref 1.4–7.7)
Neutrophils Relative %: 80 % — ABNORMAL HIGH (ref 43.0–77.0)
Platelets: 131 10*3/uL — ABNORMAL LOW (ref 150.0–400.0)
RBC: 5.09 Mil/uL (ref 3.87–5.11)
RDW: 14.8 % (ref 11.5–15.5)
WBC: 6.3 10*3/uL (ref 4.0–10.5)

## 2018-01-22 LAB — POC INFLUENZA A&B (BINAX/QUICKVUE)
Influenza A, POC: NEGATIVE
Influenza B, POC: NEGATIVE

## 2018-01-22 LAB — LIPASE: Lipase: 28 U/L (ref 11.0–59.0)

## 2018-01-22 MED ORDER — DOXYCYCLINE HYCLATE 100 MG PO TABS: 100.0000 mg | ORAL_TABLET | Freq: Two times a day (BID) | ORAL | 0 refills | Status: DC

## 2018-01-22 MED ORDER — IPRATROPIUM-ALBUTEROL 0.5-2.5 (3) MG/3ML IN SOLN
3.0000 mL | Freq: Once | RESPIRATORY_TRACT | Status: AC
  Administered 2018-01-22: 3 mL via RESPIRATORY_TRACT

## 2018-01-22 MED ORDER — METHYLPREDNISOLONE 4 MG PO TBPK: ORAL_TABLET | ORAL | 0 refills | Status: DC

## 2018-01-22 MED ORDER — LEVOFLOXACIN 500 MG PO TABS: 500.0000 mg | ORAL_TABLET | Freq: Every day | ORAL | 0 refills | Status: DC

## 2018-01-22 MED ORDER — ONDANSETRON 4 MG PO TBDP
4.0000 mg | ORAL_TABLET | Freq: Once | ORAL | Status: AC
Start: 2018-01-22 — End: 2018-01-22
  Administered 2018-01-22: 4 mg via ORAL

## 2018-01-22 NOTE — Progress Notes (Signed)
Remote pacemaker transmission.   

## 2018-01-22 NOTE — Assessment & Plan Note (Signed)
Followed by pulmonology - now with exacerbation and CAP - rec levaquin, prednisone, scheduled duoneb next few days. Update if not improving with treatment.

## 2018-01-22 NOTE — Assessment & Plan Note (Addendum)
Overall nonspecific findings - check labs and UA. UA without significant concern for infection on microscopy. Ongoing nausea - encouraged good hydration status and phenergan use (she has at home). Evidence of mild dehydration on exam.

## 2018-01-22 NOTE — Progress Notes (Signed)
BP 124/66 (BP Location: Left Arm, Patient Position: Sitting, Cuff Size: Normal)   Pulse (!) 104   Temp 99 F (37.2 C) (Oral)   Ht 5\' 6"  (1.676 m)   Wt 199 lb (90.3 kg)   SpO2 93%   BMI 32.12 kg/m    CC: dizziness Subjective:    Patient ID: [name], female    DOB: Jul 09, 1946, 72 y.o.   MRN: [id number]  HPI: [name] is a 72 y.o. female presenting on 01/22/2018 for Dizziness (Says head feels bigger than it is. Started feeling bad this morning.); Nausea (Denies any vomiting.); and Cough (Dry cough started yesterday.)   Several day h/o headache, ST, 1d h/o nausea, croupy and wet sounding cough, malaise. Dyspnea and wheeze present. Some PNdrainage. Chest congestion present.   No fevers/chills, ear or tooth pain, sinus congestion or facial pain. No abd pain, diarrhea, constipation, new rash. Denies urinary symptoms of dysuria, frequency or hematuria.   No sick contacts at home.  No smoke exposure Using albuterol inhaler every 4 hours. Continues spiriva Upcoming appt with urology next week.  She takes lasix 5 days a week.   Had flu A/bronchitis in March seen at [facility] - treated with tamiflu, medrol dosepack, and doxycycline.   Down 18 lbs since she started weight watchers several months ago!  Relevant past medical, surgical, family and social history reviewed and updated as indicated. Interim medical history since our last visit reviewed. Allergies and medications reviewed and updated. Outpatient Medications Prior to Visit  Medication Sig Dispense Refill  . acetaminophen (TYLENOL) 325 MG tablet Take 325 mg by mouth as needed for mild pain or headache (2 tablets as needed).    Marland Kitchen albuterol (PROVENTIL HFA;VENTOLIN HFA) 108 (90 Base) MCG/ACT inhaler Inhale 2 puffs into the lungs every 6 (six) hours as needed for wheezing. 1 Inhaler 2  . amoxicillin (AMOXIL) 500 MG capsule take 4 capsules by mouth 30-60 MINUTES PRIOR TO DENTAL APPT 4 capsule 12  . aspirin 81 MG tablet Take  81 mg by mouth at bedtime.     Marland Kitchen BIOTIN PO Take 1 tablet by mouth at bedtime.     . bisoprolol (ZEBETA) 5 MG tablet TAKE 1/2 TABLET BY MOUTH ONCE DAILY 15 tablet 11  . Calcium Carbonate-Vitamin D (CALCIUM 600 + D PO) Take 600 mg by mouth at bedtime.     . Cholecalciferol (VITAMIN D) 2000 UNITS CAPS Take 2,000 Units by mouth at bedtime.     . fluticasone furoate-vilanterol (BREO ELLIPTA) 200-25 MCG/INH AEPB Inhale 1 puff into the lungs daily. 60 each 8  . furosemide (LASIX) 40 MG tablet TAKE 1 TABLET BY MOUTH EVERY DAY 5 TIMES A WEEK 20 tablet 11  . ipratropium-albuterol (DUONEB) 0.5-2.5 (3) MG/3ML SOLN Take 3 mLs by nebulization every 6 (six) hours as needed. 360 mL 0  . lovastatin (MEVACOR) 40 MG tablet take 1 tablet by mouth at bedtime 30 tablet 5  . Miconazole Nitrate 2 % POWD Apply 1 application topically daily as needed (for skin). Apply to stomach and under breasts    . mirabegron ER (MYRBETRIQ) 25 MG TB24 tablet Take 1 tablet (25 mg total) by mouth daily. 30 tablet 1  . polycarbophil (FIBERCON) 625 MG tablet Take 625 mg by mouth daily.    . polyethylene glycol (MIRALAX / GLYCOLAX) packet Take 17 g by mouth daily. 14 each 0  . potassium chloride (K-DUR) 10 MEQ tablet take 2 tablets by mouth once daily 60 tablet  5  . traMADol (ULTRAM) 50 MG tablet Take 0.5-1 tablets (25-50 mg total) by mouth 2 (two) times daily as needed for moderate pain. 30 tablet 0  . vitamin B-12 (CYANOCOBALAMIN) 500 MCG tablet Take 500 mcg by mouth daily.    . montelukast (SINGULAIR) 10 MG tablet take 1 tablet by mouth at bedtime 30 tablet 5  . SPIRIVA HANDIHALER 18 MCG inhalation capsule inhale the contents of one capsule in the handihaler once daily 30 capsule 5   No facility-administered medications prior to visit.      Per HPI unless specifically indicated in ROS section below Review of Systems     Objective:    BP 124/66 (BP Location: Left Arm, Patient Position: Sitting, Cuff Size: Normal)   Pulse (!) 104    Temp 99 F (37.2 C) (Oral)   Ht 5\' 6"  (1.676 m)   Wt 199 lb (90.3 kg)   SpO2 93%   BMI 32.12 kg/m   Wt Readings from Last 3 Encounters:  01/22/18 199 lb (90.3 kg)  12/27/17 206 lb 12.8 oz (93.8 kg)  11/22/17 211 lb (95.7 kg)    Physical Exam  Constitutional: She appears well-developed and well-nourished. No distress.  HENT:  Head: Normocephalic and atraumatic.  Right Ear: Hearing, tympanic membrane, external ear and ear canal normal.  Left Ear: Hearing, tympanic membrane, external ear and ear canal normal.  Nose: Mucosal edema present. No rhinorrhea. Right sinus exhibits no maxillary sinus tenderness and no frontal sinus tenderness. Left sinus exhibits no maxillary sinus tenderness and no frontal sinus tenderness.  Mouth/Throat: Uvula is midline. Mucous membranes are dry. Posterior oropharyngeal edema and posterior oropharyngeal erythema present. No oropharyngeal exudate or tonsillar abscesses.  Eyes: Pupils are equal, round, and reactive to light. Conjunctivae and EOM are normal. No scleral icterus.  Neck: Normal range of motion. Neck supple.  Cardiovascular: Normal rate, regular rhythm, normal heart sounds and intact distal pulses.  No murmur heard. Pulmonary/Chest: Effort normal. No respiratory distress. She has decreased breath sounds. She has wheezes. She has rales (bibasilarly).  Lymphadenopathy:    She has no cervical adenopathy.  Skin: Skin is warm and dry. No rash noted.  Nursing note and vitals reviewed.  Results for orders placed or performed in visit on 01/22/18  POC Influenza A&B(BINAX/QUICKVUE)  Result Value Ref Range   Influenza A, POC Negative Negative   Influenza B, POC Negative Negative  POCT Urinalysis Dipstick (Automated)  Result Value Ref Range   Color, UA yellow    Clarity, UA clear    Glucose, UA negative    Bilirubin, UA negative    Ketones, UA 1+    Spec Grav, UA 1.010 1.010 - 1.025   Blood, UA negative    pH, UA 7.0 5.0 - 8.0   Protein, UA 1+      Urobilinogen, UA 0.2 0.2 or 1.0 E.U./dL   Nitrite, UA negative    Leukocytes, UA Small (1+) (A) Negative   *Note: Due to a large number of results and/or encounters for the requested time period, some results have not been displayed. A complete set of results can be found in Results Review.   Lab Results  Component Value Date   CREATININE 1.10 11/15/2017    Lab Results  Component Value Date   HGBA1C 5.5 12/06/2013  DG Chest 2 View CLINICAL DATA:  Cough.  Bibasilar rales.  Malaise and fatigue.  EXAM: CHEST - 2 VIEW  COMPARISON:  Two-view chest x-ray 08/22/2017  FINDINGS: The  heart size is normal. 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 Right lower lobe airspace disease is new. A small right pleural effusion is present.  IMPRESSION: 1. New right lower lobe airspace disease/pneumonia. 2. Median sternotomy for valve replacements.  Electronically Signed   By: San Morelle [name]   On: 01/22/2018 12:54      Assessment & Plan:   Problem List Items Addressed This Visit    CAP (community acquired pneumonia) - Primary    Lung findings suggestive of pneumonia. I did not see obvious xray finding to suggest this but radiology does note new RLL airspace disease. Will treat with levaquin 500mg  7d course. Prednisone for wheezing heard today - to cover COPD exacerbation. Supportive care reviewed with patient.       Relevant Medications   ipratropium-albuterol (DUONEB) 0.5-2.5 (3) MG/3ML nebulizer solution 3 mL (Completed)   levofloxacin (LEVAQUIN) 500 MG tablet   CKD (chronic kidney disease) stage 3, GFR 30-59 ml/min (HCC)   COPD (chronic obstructive pulmonary disease) (HCC)    Followed by pulmonology - now with exacerbation and CAP - rec levaquin, prednisone, scheduled duoneb next few days. Update if not improving with treatment.       Relevant Medications   ipratropium-albuterol (DUONEB) 0.5-2.5 (3) MG/3ML nebulizer solution 3 mL  (Completed)   methylPREDNISolone (MEDROL DOSEPAK) 4 MG TBPK tablet   Malaise and fatigue    Overall nonspecific findings - check labs and UA. UA without significant concern for infection on microscopy. Ongoing nausea - encouraged good hydration status and phenergan use (she has at home). Evidence of mild dehydration on exam.       Relevant Orders   Basic metabolic panel   CBC with Differential/Platelet   POC Influenza A&B(BINAX/QUICKVUE) (Completed)   Hepatic function panel   POCT Urinalysis Dipstick (Automated) (Completed)   Mixed stress and urge urinary incontinence    Upcoming appt with Dr Matilde Sprang urology       Other Visit Diagnoses    Nausea       Relevant Medications   ondansetron (ZOFRAN-ODT) disintegrating tablet 4 mg (Completed)   Other Relevant Orders   Lipase   Cough       Relevant Medications   ipratropium-albuterol (DUONEB) 0.5-2.5 (3) MG/3ML nebulizer solution 3 mL (Completed)   Other Relevant Orders   DG Chest 2 View (Completed)       Meds ordered this encounter  Medications  . ondansetron (ZOFRAN-ODT) disintegrating tablet 4 mg  . ipratropium-albuterol (DUONEB) 0.5-2.5 (3) MG/3ML nebulizer solution 3 mL  . DISCONTD: doxycycline (VIBRA-TABS) 100 MG tablet    Sig: Take 1 tablet (100 mg total) by mouth 2 (two) times daily.    Dispense:  20 tablet    Refill:  0  . methylPREDNISolone (MEDROL DOSEPAK) 4 MG TBPK tablet    Sig: Use as directed    Dispense:  21 tablet    Refill:  0  . levofloxacin (LEVAQUIN) 500 MG tablet    Sig: Take 1 tablet (500 mg total) by mouth daily.    Dispense:  7 tablet    Refill:  0    Take this antibiotic in place of doxycycline - cancel doxycycline Rx please.   Orders Placed This Encounter  Procedures  . DG Chest 2 View    Standing Status:   Future    Number of Occurrences:   1    Standing Expiration Date:   03/25/2019    Order Specific Question:   Reason  for Exam (SYMPTOM  OR DIAGNOSIS REQUIRED)    Answer:   cough,  bibasilar rales, malaise and fatigue    Order Specific Question:   Preferred imaging location?    Answer:   [facility]    Order Specific Question:   Radiology Contrast Protocol - do NOT remove file path    Answer:   [email or web address]  . Basic metabolic panel  . CBC with Differential/Platelet  . Hepatic function panel  . Lipase  . POC Influenza A&B(BINAX/QUICKVUE)  . POCT Urinalysis Dipstick (Automated)    Follow up plan: Return if symptoms worsen or fail to improve.  Ria Bush, MD

## 2018-01-22 NOTE — Telephone Encounter (Signed)
Rx has been sent to the pharmacy electronically. ° °

## 2018-01-22 NOTE — Patient Instructions (Addendum)
Xrays looking ok.  Labs today.  zofran for nausea provided today.  Possible COPD exacerbation - treat with prednisone and antibiotic course sent to pharmacy.  Do home duonebs for next few days  Push small sips of fluids throughout the day  Use phenergan at home.

## 2018-01-22 NOTE — Assessment & Plan Note (Signed)
Lung findings suggestive of pneumonia. I did not see obvious xray finding to suggest this but radiology does note new RLL airspace disease. Will treat with levaquin 500mg  7d course. Prednisone for wheezing heard today - to cover COPD exacerbation. Supportive care reviewed with patient.

## 2018-01-22 NOTE — Assessment & Plan Note (Signed)
Upcoming appt with Dr Matilde Sprang urology

## 2018-01-23 ENCOUNTER — Telehealth (HOSPITAL_COMMUNITY): Payer: Self-pay | Admitting: Family Medicine

## 2018-01-23 ENCOUNTER — Telehealth: Payer: Self-pay | Admitting: Family Medicine

## 2018-01-23 ENCOUNTER — Encounter (HOSPITAL_COMMUNITY): Payer: Self-pay

## 2018-01-23 NOTE — Telephone Encounter (Signed)
Copied from [place] [id number]. Topic: Quick Communication - See Telephone Encounter >> Jan 23, 2018  2:42 PM Vernona Rieger wrote: CRM for notification. See Telephone encounter for: 01/23/18.  Patient would like to know what her lab results are from 01/23/18. Call back is [phone]

## 2018-01-24 ENCOUNTER — Encounter: Payer: Self-pay | Admitting: Family Medicine

## 2018-01-24 ENCOUNTER — Encounter: Payer: Self-pay | Admitting: Cardiology

## 2018-01-24 NOTE — Telephone Encounter (Signed)
labs were not annotated with results/comments. Only indicates they were discussed at [place]

## 2018-01-24 NOTE — Telephone Encounter (Signed)
Patient is calling to check the status of her lab work. If someone could give her a call back when they are ready

## 2018-01-24 NOTE — Telephone Encounter (Signed)
Spoke with pt about lab results. [See lab result note, 01/22/18.]

## 2018-01-24 NOTE — Telephone Encounter (Signed)
Annotation is only on UA results.  We did not discuss labs as they were not back yet. plz call with results - I have annotated rest of labs.

## 2018-01-25 ENCOUNTER — Encounter (HOSPITAL_COMMUNITY): Payer: Self-pay

## 2018-01-29 LAB — CUP PACEART REMOTE DEVICE CHECK
Battery Remaining Longevity: 119 mo
Battery Remaining Percentage: 95.5 %
Battery Voltage: 3.01 V
Brady Statistic AP VP Percent: 48 %
Brady Statistic AP VS Percent: 1.7 %
Brady Statistic AS VP Percent: 35 %
Brady Statistic AS VS Percent: 15 %
Brady Statistic RA Percent Paced: 48 %
Brady Statistic RV Percent Paced: 83 %
Date Time Interrogation Session: 20190415060013
Implantable Lead Implant Date: 20170907
Implantable Lead Implant Date: 20170907
Implantable Lead Location: 753859
Implantable Lead Location: 753860
Implantable Pulse Generator Implant Date: 20170907
Lead Channel Impedance Value: 430 Ohm
Lead Channel Impedance Value: 690 Ohm
Lead Channel Pacing Threshold Amplitude: 0.5 V
Lead Channel Pacing Threshold Amplitude: 1 V
Lead Channel Pacing Threshold Pulse Width: 0.5 ms
Lead Channel Pacing Threshold Pulse Width: 0.5 ms
Lead Channel Sensing Intrinsic Amplitude: 12 mV
Lead Channel Sensing Intrinsic Amplitude: 5 mV
Lead Channel Setting Pacing Amplitude: 1.25 V
Lead Channel Setting Pacing Amplitude: 2 V
Lead Channel Setting Pacing Pulse Width: 0.5 ms
Lead Channel Setting Sensing Sensitivity: 2 mV
Pulse Gen Model: 2272
Pulse Gen Serial Number: 7945283

## 2018-01-30 ENCOUNTER — Encounter (HOSPITAL_COMMUNITY): Payer: Self-pay

## 2018-02-01 ENCOUNTER — Encounter (HOSPITAL_COMMUNITY): Payer: Self-pay

## 2018-02-05 ENCOUNTER — Ambulatory Visit (INDEPENDENT_AMBULATORY_CARE_PROVIDER_SITE_OTHER): Payer: Medicare Other | Admitting: Internal Medicine

## 2018-02-05 ENCOUNTER — Encounter: Payer: Self-pay | Admitting: Internal Medicine

## 2018-02-05 ENCOUNTER — Other Ambulatory Visit (INDEPENDENT_AMBULATORY_CARE_PROVIDER_SITE_OTHER): Payer: Medicare Other

## 2018-02-05 VITALS — BP 128/88 | HR 92 | Ht 66.0 in | Wt 191.2 lb

## 2018-02-05 DIAGNOSIS — R945 Abnormal results of liver function studies: Secondary | ICD-10-CM | POA: Diagnosis not present

## 2018-02-05 DIAGNOSIS — J439 Emphysema, unspecified: Secondary | ICD-10-CM | POA: Diagnosis not present

## 2018-02-05 DIAGNOSIS — J449 Chronic obstructive pulmonary disease, unspecified: Secondary | ICD-10-CM

## 2018-02-05 DIAGNOSIS — Z8701 Personal history of pneumonia (recurrent): Secondary | ICD-10-CM

## 2018-02-05 DIAGNOSIS — J441 Chronic obstructive pulmonary disease with (acute) exacerbation: Secondary | ICD-10-CM | POA: Diagnosis not present

## 2018-02-05 DIAGNOSIS — R7989 Other specified abnormal findings of blood chemistry: Secondary | ICD-10-CM

## 2018-02-05 LAB — BASIC METABOLIC PANEL
BUN: 18 mg/dL (ref 6–23)
CO2: 28 mEq/L (ref 19–32)
Calcium: 9.9 mg/dL (ref 8.4–10.5)
Chloride: 101 mEq/L (ref 96–112)
Creatinine, Ser: 0.84 mg/dL (ref 0.40–1.20)
GFR: 70.72 mL/min (ref >60.00)
Glucose, Bld: 90 mg/dL (ref 70–99)
Potassium: 4.4 mEq/L (ref 3.5–5.1)
Sodium: 140 mEq/L (ref 135–145)

## 2018-02-05 LAB — HEPATIC FUNCTION PANEL
ALT: 64 U/L — ABNORMAL HIGH (ref 0–35)
AST: 25 U/L (ref 0–37)
Albumin: 3.9 g/dL (ref 3.5–5.2)
Alkaline Phosphatase: 88 U/L (ref 39–117)
Bilirubin, Direct: 0.2 mg/dL (ref 0.0–0.3)
Total Bilirubin: 0.9 mg/dL (ref 0.2–1.2)
Total Protein: 7.5 g/dL (ref 6.0–8.3)

## 2018-02-05 MED ORDER — DOXYCYCLINE HYCLATE 100 MG PO TABS: 100.0000 mg | ORAL_TABLET | Freq: Two times a day (BID) | ORAL | 0 refills | Status: DC

## 2018-02-05 MED ORDER — METHYLPREDNISOLONE 4 MG PO TBPK: ORAL_TABLET | ORAL | 0 refills | Status: DC

## 2018-02-05 NOTE — Patient Instructions (Addendum)
ICD-10-CM   1. Moderate COPD (chronic obstructive pulmonary disease) (HCC) J44.9   2. COPD exacerbation ([place]) J44.1   3. History of pneumonia Z87.01   4. Abnormal LFTs R94.5     Do BMET, do LFT blood work  Take doxycycline 100mg  po twice daily x 5 days; take after meals and avoid sunlight  Do Medrol dose pak  In 4 weeks do CT chest without contrast and spirometry with dlco  Followup - call if above treatment does not resolve issues - might have to get cards to see you sooner - return in 4 weeks after CT chest and spiro/dlco

## 2018-02-05 NOTE — Progress Notes (Signed)
Subjective:     Patient ID: [name], female   DOB: [birth year], 72 y.o.   MRN: [id number]   PCP Ria Bush, MD  HPI    OV 08/02/2017  Chief Complaint  Patient presents with  . Follow-up    Pt states that she got bronchitis x1 month ago. Has been on two rounds of abx and two rounds of steroids. States that it is not in her chest anymore but is still having drainage, sore throat, dry cough, occ. SOB. Denies any CP.    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 She comes for routine follow-up. Recently she had COPD flareup was treated by primary care. Then I called in antibiotic and repeat prednisone and this again helped her. Nevertheless she is frustrated by postnasal drainage and the resultant cough. She says normally for COPD exacerbations are associated with initial onset of sinus congestion. Then she's always left with a residual postnasal drip but this time it has lingered longer. She says Sudafed helps her but is no longer available over-the-counter. She really wants relief about this critically. COPD cat score is 14. Is also asking me to sign handicap parking sticker   OV 10/31/2017  Chief Complaint  Patient presents with  . Follow-up    In hosp. 11/13-11/15 due to sepsis from UTI. Pt states she is doing good but can't seem to get her energy back. Pt currently has laryngitis and SOB with exertion.    Follow-up Gold stage II/III COPD not on home oxygen but on triple inhaler therapy.  Is a routine follow-up.  Since I last saw her in the middle of November 2018 she got diagnosed with E. coli sepsis and admitted to the hospital.  Since then she has significant amount of fatigue.  The fatigue is not improved much.  She has suspended her maintenance pulmonary rehabilitation but plans to restart it in March 2019.  She has not had any COPD exacerbations.  She is up-to-date with the flu shot.  Overall COPD CAT score is stable but there is an  increase in the level of fatigue.  The only new issue that she complains about is some mild hoarseness in voice going on for several months.  This is present early in the morning when she wakes up and then as the day goes on it is better.  She notices it when she talks a lot of time in the phone after a long period of rest.  It is insidious onset.  It is not progressive.  There are no other associated symptoms.   OV 02/05/2018  Chief Complaint  Patient presents with  . Follow-up    Pt states she saw PCP two weeks ago and had pna. Pt states she is still not over the pna and has no energy, has hoarseness in voice, cough, and has chest tightness.     Follow-up gold stage II/III COPD not on home oxygen but on triple inhaler therapy with Singulair  Routine follow-up.  She tells me t of labs from January 22, 2018 hat January 22, 2018 she started feeling chest tightness wheezing and also increase head cold.  She saw her primary care physician Dr. Danise Mina.  Apparently all the blood work was normal except for mild increase in liver enzymes which she says she fluctuates with.  He was initially thinking a sinus congestion but when he did an x-ray of the report came back as a right lower lobe pneumonia [  I personally visualized this x-ray and I agree she might not have right lower lobe pneumonia because in 2015 CT chest she had some chronic scarring at that area].  She was treated with 5-day Levaquin and a Medrol Dosepak but despite this other than subjective fevers are resolving she still has significant cough congestion and chest tightness without any orthopnea proximal nocturnal dyspnea.  COPD CAT score shows significant deterioration.  She is not feeling well.  Of note, she is intentionally losing weight because of weight watchers but she has not increased her fluid intake adequately.  She has quit all diet soda  Show slight increase in liver enzymes and also slight increase in creatinine compared to 6 months  ago.    CAT COPD Symptom & Quality of Life Score (GSK trademark) 0 is no burden. 5 is highest burden 08/02/2017  10/31/2017  02/05/2018   Never Cough -> Cough all the time 1 2 4   No phlegm in chest -> Chest is full of phlegm 0 0 3  No chest tightness -> Chest feels very tight 1 0 3  No dyspnea for 1 flight stairs/hill -> Very dyspneic for 1 flight of stairs 5 4 5   No limitations for ADL at home -> Very limited with ADL at home 1 0 5  Confident leaving home -> Not at all confident leaving home 0 0 3  Sleep soundly -> Do not sleep soundly because of lung condition 4 4 3   Lots of Energy -> No energy at all 2 3 5   TOTAL Score (max 40)  14 13 31      has a past medical history of Acute cystitis without hematuria (12/22/2015), Anemia, Arthritis, CAP (community acquired pneumonia) (02/08/2016), Cataracts, bilateral, Chronic diastolic heart failure ([place]) (09/19/2014), Chronic insomnia, Constipation, COPD (chronic obstructive pulmonary disease) ([place]), DDD (degenerative disc disease), Essential hypertension, GERD (gastroesophageal reflux disease), History of pneumonia (02/16/2016), History of radiation therapy (05/30/11 to 07/07/11), History of shingles, Hyperlipemia, Legally blind in left eye, as defined in [place], Lung nodule, Obesity, Osteopenia (12/2014), Pancreatitis (11/2014), Rectal cancer ([place]) (04/2011), Research study patient (08/26/2016), Rheumatic heart disease mitral stenosis, S/P AVR (aortic valve replacement) (2015), S/P MVR (mitral valve replacement) (2015), Seizures ([place]), Sepsis secondary to UTI ([place]) (08/22/2017), Severe aortic valve stenosis  AVR 3/15 (10/01/2013), Sleep apnea, Small bowel obstruction, partial ([place]) (08/2013), and Vitamin B12 deficiency (02/28/2015).   reports that she quit smoking about 43 years ago. Her smoking use included cigarettes. She has a 15.00 pack-year smoking history. She has never used smokeless tobacco.  Past Surgical History:  Procedure Laterality Date  . AORTIC  VALVE REPLACEMENT N/A 12/12/2013   Procedure: AORTIC VALVE REPLACEMENT (AVR);  Surgeon: Gaye Pollack, MD; Service: Open Heart Surgery  . BACK SURGERY  2006, 2007   2006 SPACER, 2007 decompression and fusion L4/5  . BOWEL RESECTION  04/02/2012   Procedure: SMALL BOWEL RESECTION;  Surgeon: Stark Klein, MD;  Location: [facility];  Service: General;  Laterality: N/A;  . CARPAL TUNNEL RELEASE Bilateral   . CATARACT EXTRACTION W/ INTRAOCULAR LENS IMPLANT Left 10/18/2016  . CATARACT EXTRACTION W/ INTRAOCULAR LENS IMPLANT Right 12/13/2016   Dr. Kathrin Penner  . CHOLECYSTECTOMY N/A 01/21/2015   chronic cholecystitis, Stark Klein, MD  . COLON RESECTION  08/25/2011   Procedure: COLON RESECTION LAPAROSCOPIC;  Surgeon: Stark Klein, MD;  Location: [facility];  Service: General;  Laterality: N/A;  Laparoscopic Assisted Low Anterior Resection Diverting Ostomy and onQ pain pump  . COLONOSCOPY  03/2013  1 polyp, rpt 3 yrs Ardis Hughs)  . COLONOSCOPY WITH PROPOFOL N/A 06/09/2016   patent colo-colonic anastomosis, rpt 5 yrs Ardis Hughs)  . EP IMPLANTABLE DEVICE N/A 06/16/2016   Procedure: Pacemaker Implant;  Surgeon: Will Meredith Leeds, MD;  Location: [facility];  Service: Cardiovascular;  Laterality: N/A;  . FOOT SURGERY  left foot   hammer toe  . HAND SURGERY  Bil   carpal tunnel  . ILEOSTOMY  08/25/2011  . ILEOSTOMY CLOSURE  04/02/2012   Procedure: ILEOSTOMY TAKEDOWN;  Surgeon: Stark Klein, MD;  Location: [facility];  Service: General;  Laterality: N/A;  . INTRAOPERATIVE TRANSESOPHAGEAL ECHOCARDIOGRAM N/A 12/12/2013   Procedure: INTRAOPERATIVE TRANSESOPHAGEAL ECHOCARDIOGRAM;  Surgeon: Gaye Pollack, MD;  Location: [facility];  Service: Open Heart Surgery;  Laterality: N/A;  . LEFT AND RIGHT HEART CATHETERIZATION WITH CORONARY ANGIOGRAM N/A 11/27/2013   Procedure: LEFT AND RIGHT HEART CATHETERIZATION WITH CORONARY ANGIOGRAM;  Surgeon: Sanda Klein, MD;  Location: [facility];  Service: Cardiovascular;  Laterality: N/A;  .  MITRAL VALVE REPLACEMENT N/A 12/12/2013   Procedure: MITRAL VALVE (MV) REPLACEMENT OR REPAIR;  Surgeon: Gaye Pollack, MD; Service: Open Heart Surgery  . TEE WITHOUT CARDIOVERSION N/A 11/29/2013   Procedure: TRANSESOPHAGEAL ECHOCARDIOGRAM (TEE);  Surgeon: Sueanne Margarita, MD;  Location: [facility];  Service: Cardiovascular;  Laterality: N/A;  . TONSILLECTOMY      Allergies  Allergen Reactions  . Sulfa Antibiotics   . Codeine Nausea Only  . Prednisone Hives    In different places all over the body.  . Sulfonamide Derivatives Nausea Only    Immunization History  Administered Date(s) Administered  . H1N1 09/17/2008  . Influenza Split 07/18/2012  . Influenza Whole 08/10/2005, 06/24/2009, 07/11/2011  . Influenza, High Dose Seasonal PF 07/04/2016, 07/04/2017  . Influenza-Unspecified 07/24/2013, 06/30/2014, 06/30/2015  . Pneumococcal Conjugate-13 06/02/2014  . Pneumococcal Polysaccharide-23 08/10/2005, 04/21/2008, 06/25/2013  . Td 10/11/2005  . Zoster 09/16/2014    Family History  Problem Relation Age of Onset  . Lung cancer Father   . Heart disease Mother   . Diabetes Mother   . Hypertension Mother   . Stroke Mother   . CAD Son 71  . Heart attack Son 69  . Heart attack Maternal Grandfather   . Breast cancer Maternal Aunt 83  . Colon cancer Neg Hx   . Esophageal cancer Neg Hx   . Stomach cancer Neg Hx   . Rectal cancer Neg Hx      Current Outpatient Medications:  .  acetaminophen (TYLENOL) 325 MG tablet, Take 325 mg by mouth as needed for mild pain or headache (2 tablets as needed)., Disp: , Rfl:  .  albuterol (PROVENTIL HFA;VENTOLIN HFA) 108 (90 Base) MCG/ACT inhaler, Inhale 2 puffs into the lungs every 6 (six) hours as needed for wheezing., Disp: 1 Inhaler, Rfl: 2 .  aspirin 81 MG tablet, Take 81 mg by mouth at bedtime. , Disp: , Rfl:  .  BIOTIN PO, Take 1 tablet by mouth at bedtime. , Disp: , Rfl:  .  bisoprolol (ZEBETA) 5 MG tablet, TAKE 1/2 TABLET BY MOUTH ONCE DAILY,  Disp: 15 tablet, Rfl: 11 .  Calcium Carbonate-Vitamin D (CALCIUM 600 + D PO), Take 600 mg by mouth at bedtime. , Disp: , Rfl:  .  Cholecalciferol (VITAMIN D) 2000 UNITS CAPS, Take 2,000 Units by mouth at bedtime. , Disp: , Rfl:  .  fluticasone furoate-vilanterol (BREO ELLIPTA) 200-25 MCG/INH AEPB, Inhale 1 puff into the lungs daily.,  Disp: 60 each, Rfl: 8 .  furosemide (LASIX) 40 MG tablet, TAKE 1 TABLET BY MOUTH EVERY DAY 5 TIMES A WEEK, Disp: 20 tablet, Rfl: 11 .  ipratropium-albuterol (DUONEB) 0.5-2.5 (3) MG/3ML SOLN, Take 3 mLs by nebulization every 6 (six) hours as needed., Disp: 360 mL, Rfl: 0 .  lovastatin (MEVACOR) 40 MG tablet, TAKE 1 TABLET BY MOUTH AT BEDTIME, Disp: 30 tablet, Rfl: 4 .  Miconazole Nitrate 2 % POWD, Apply 1 application topically daily as needed (for skin). Apply to stomach and under breasts, Disp: , Rfl:  .  montelukast (SINGULAIR) 10 MG tablet, TAKE 1 TABLET BY MOUTH AT BEDTIME, Disp: 30 tablet, Rfl: 3 .  polycarbophil (FIBERCON) 625 MG tablet, Take 625 mg by mouth daily., Disp: , Rfl:  .  polyethylene glycol (MIRALAX / GLYCOLAX) packet, Take 17 g by mouth daily., Disp: 14 each, Rfl: 0 .  potassium chloride (K-DUR) 10 MEQ tablet, take 2 tablets by mouth once daily, Disp: 60 tablet, Rfl: 5 .  SPIRIVA HANDIHALER 18 MCG inhalation capsule, INHALE THE CONTENTS OF ONE CAPSULE IN THE HANDIHALER ONCE DAILY, Disp: 30 capsule, Rfl: 3 .  vitamin B-12 (CYANOCOBALAMIN) 500 MCG tablet, Take 500 mcg by mouth daily., Disp: , Rfl:  .  amoxicillin (AMOXIL) 500 MG capsule, take 4 capsules by mouth 30-60 MINUTES PRIOR TO DENTAL APPT (Patient not taking: Reported on 02/05/2018), Disp: 4 capsule, Rfl: 12 .  traMADol (ULTRAM) 50 MG tablet, Take 0.5-1 tablets (25-50 mg total) by mouth 2 (two) times daily as needed for moderate pain. (Patient not taking: Reported on 02/05/2018), Disp: 30 tablet, Rfl: 0   Review of Systems     Objective:   Physical Exam  Constitutional: She is oriented to  person, place, and time. She appears well-developed and well-nourished. No distress.  Looks more tired  HENT:  Head: Normocephalic and atraumatic.  Right Ear: External ear normal.  Left Ear: External ear normal.  Mouth/Throat: Oropharynx is clear and moist. No oropharyngeal exudate.  Eyes: Pupils are equal, round, and reactive to light. Conjunctivae and EOM are normal. Right eye exhibits no discharge. Left eye exhibits no discharge. No scleral icterus.  Neck: Normal range of motion. Neck supple. No JVD present. No tracheal deviation present. No thyromegaly present.  Cardiovascular: Normal rate, regular rhythm, normal heart sounds and intact distal pulses. Exam reveals no gallop and no friction rub.  No murmur heard. Pulmonary/Chest: Effort normal. No respiratory distress. She has wheezes. She has no rales. She exhibits no tenderness.  Diffuse wheezing - not her baseline  Abdominal: Soft. Bowel sounds are normal. She exhibits no distension and no mass. There is no tenderness. There is no rebound and no guarding.  Musculoskeletal: Normal range of motion. She exhibits no edema or tenderness.  Lymphadenopathy:    She has no cervical adenopathy.  Neurological: She is alert and oriented to person, place, and time. She has normal reflexes. No cranial nerve deficit. She exhibits normal muscle tone. Coordination normal.  Skin: Skin is warm and dry. No rash noted. She is not diaphoretic. No erythema. No pallor.  Psychiatric: She has a normal mood and affect. Her behavior is normal. Judgment and thought content normal.  Vitals reviewed.  Vitals:   02/05/18 0937  BP: 128/88  Pulse: 92  SpO2: 96%  Weight: 191 lb 3.2 oz (86.7 kg)  Height: 5\' 6"  (1.676 m)    Estimated body mass index is 30.86 kg/m as calculated from the following:   Height as of  this encounter: 5\' 6"  (1.676 m).   Weight as of this encounter: 191 lb 3.2 oz (86.7 kg).     Assessment:       ICD-10-CM   1. Moderate COPD (chronic  obstructive pulmonary disease) (HCC) J44.9   2. COPD exacerbation ([place]) J44.1   3. History of pneumonia Z87.01   4. Abnormal LFTs R94.5        Plan:      Do BMET, do LFT blood work  Take doxycycline 100mg  po twice daily x 5 days; take after meals and avoid sunlight  Do Medrol dose pak  In 4 weeks do CT chest without contrast and spirometry with dlco  Followup - call if above treatment does not resolve issues - might have to get cards to see you sooner - return in 4 weeks after CT chest and spiro/dlco    Dr. Brand Males, [name], [facility].C.P Pulmonary and Critical Care Medicine Staff Physician, [place] Director - Interstitial Lung Disease  Program  Pulmonary [facility] [place], [place], [address]  Pager: [phone], If no answer or between  15:00h - 7:00h: call [phone] Telephone: [phone]

## 2018-02-06 ENCOUNTER — Encounter (HOSPITAL_COMMUNITY): Payer: Self-pay

## 2018-02-08 ENCOUNTER — Encounter (HOSPITAL_COMMUNITY): Payer: Self-pay | Attending: Internal Medicine

## 2018-02-08 DIAGNOSIS — I441 Atrioventricular block, second degree: Secondary | ICD-10-CM | POA: Insufficient documentation

## 2018-02-08 DIAGNOSIS — Z952 Presence of prosthetic heart valve: Secondary | ICD-10-CM | POA: Insufficient documentation

## 2018-02-08 DIAGNOSIS — I5032 Chronic diastolic (congestive) heart failure: Secondary | ICD-10-CM | POA: Insufficient documentation

## 2018-02-13 ENCOUNTER — Encounter (HOSPITAL_COMMUNITY): Payer: Self-pay

## 2018-02-15 ENCOUNTER — Ambulatory Visit
Admission: RE | Admit: 2018-02-15 | Discharge: 2018-02-15 | Disposition: A | Discharge status: Home / Self Care | Payer: Medicare Other | Source: Ambulatory Visit | Attending: Family Medicine | Admitting: Family Medicine

## 2018-02-15 ENCOUNTER — Encounter (HOSPITAL_COMMUNITY): Payer: Self-pay

## 2018-02-15 DIAGNOSIS — Z1231 Encounter for screening mammogram for malignant neoplasm of breast: Secondary | ICD-10-CM

## 2018-02-16 ENCOUNTER — Other Ambulatory Visit: Payer: Self-pay | Admitting: Family Medicine

## 2018-02-16 ENCOUNTER — Other Ambulatory Visit: Payer: Medicare Other

## 2018-02-16 DIAGNOSIS — R928 Other abnormal and inconclusive findings on diagnostic imaging of breast: Secondary | ICD-10-CM

## 2018-02-19 ENCOUNTER — Telehealth: Payer: Self-pay | Admitting: Family Medicine

## 2018-02-19 NOTE — Telephone Encounter (Signed)
Copied from [place] [id number]. Topic: Quick Communication - See Telephone Encounter >> Feb 19, 2018 11:56 AM Ether Griffins B wrote: CRM for notification. See Telephone encounter for: 02/19/18.  [facility] calling to get [facility] signed for pt's mammo tomorrow.

## 2018-02-19 NOTE — Telephone Encounter (Signed)
Faxed back as instructed.

## 2018-02-19 NOTE — Telephone Encounter (Signed)
I've signed electronically but plz check in my in box to see if paper order is there then see if Dr Damita Dunnings will sign this. Thanks.

## 2018-02-19 NOTE — Telephone Encounter (Signed)
I signed the hard copy since Dr. Darnell Level was out of the office. Please fax back. Thanks.

## 2018-02-19 NOTE — Telephone Encounter (Signed)
Copied from [place] [phone]. Topic: Quick Communication - See Telephone Encounter >> Feb 19, 2018  8:52 AM Vernona Rieger wrote: CRM for notification. See Telephone encounter for: 02/19/18.  Patient states that she has a re-check on her breasts tomorrow. Her appointment is at 1:30 tomorrow. They really want to move her up earlier, as in tomorrow @ 8am, but she said they can not because they need an order from Dr. Darnell Level. There number is [phone] @ the [facility] center on [address].

## 2018-02-20 ENCOUNTER — Ambulatory Visit
Admission: RE | Admit: 2018-02-20 | Discharge: 2018-02-20 | Disposition: A | Discharge status: Home / Self Care | Payer: Medicare Other | Source: Ambulatory Visit | Attending: Family Medicine | Admitting: Family Medicine

## 2018-02-20 ENCOUNTER — Encounter (HOSPITAL_COMMUNITY): Payer: Self-pay

## 2018-02-20 ENCOUNTER — Encounter: Payer: Self-pay | Admitting: *Deleted

## 2018-02-20 ENCOUNTER — Encounter: Payer: Self-pay | Admitting: Cardiology

## 2018-02-20 ENCOUNTER — Other Ambulatory Visit: Payer: Medicare Other

## 2018-02-20 ENCOUNTER — Ambulatory Visit: Payer: Medicare Other

## 2018-02-20 ENCOUNTER — Ambulatory Visit (INDEPENDENT_AMBULATORY_CARE_PROVIDER_SITE_OTHER): Payer: Medicare Other | Admitting: Cardiology

## 2018-02-20 VITALS — BP 110/78 | HR 99 | Ht 66.0 in | Wt 187.4 lb

## 2018-02-20 DIAGNOSIS — I441 Atrioventricular block, second degree: Secondary | ICD-10-CM | POA: Diagnosis not present

## 2018-02-20 DIAGNOSIS — I099 Rheumatic heart disease, unspecified: Secondary | ICD-10-CM | POA: Diagnosis not present

## 2018-02-20 DIAGNOSIS — I480 Paroxysmal atrial fibrillation: Secondary | ICD-10-CM

## 2018-02-20 DIAGNOSIS — E785 Hyperlipidemia, unspecified: Secondary | ICD-10-CM | POA: Diagnosis not present

## 2018-02-20 DIAGNOSIS — I5032 Chronic diastolic (congestive) heart failure: Secondary | ICD-10-CM

## 2018-02-20 DIAGNOSIS — R0789 Other chest pain: Secondary | ICD-10-CM

## 2018-02-20 DIAGNOSIS — R928 Other abnormal and inconclusive findings on diagnostic imaging of breast: Secondary | ICD-10-CM

## 2018-02-20 LAB — CUP PACEART INCLINIC DEVICE CHECK
Battery Remaining Longevity: 122 mo
Battery Voltage: 3.01 V
Brady Statistic RA Percent Paced: 41 %
Brady Statistic RV Percent Paced: 92 %
Date Time Interrogation Session: 20190514164149
Implantable Lead Implant Date: 20170907
Implantable Lead Implant Date: 20170907
Implantable Lead Location: 753859
Implantable Lead Location: 753860
Implantable Pulse Generator Implant Date: 20170907
Lead Channel Impedance Value: 450 Ohm
Lead Channel Impedance Value: 712.5 Ohm
Lead Channel Pacing Threshold Amplitude: 0.5 V
Lead Channel Pacing Threshold Amplitude: 0.5 V
Lead Channel Pacing Threshold Amplitude: 0.75 V
Lead Channel Pacing Threshold Amplitude: 0.75 V
Lead Channel Pacing Threshold Pulse Width: 0.5 ms
Lead Channel Pacing Threshold Pulse Width: 0.5 ms
Lead Channel Pacing Threshold Pulse Width: 0.5 ms
Lead Channel Pacing Threshold Pulse Width: 0.5 ms
Lead Channel Sensing Intrinsic Amplitude: 11.2 mV
Lead Channel Sensing Intrinsic Amplitude: 5 mV
Lead Channel Setting Pacing Amplitude: 1 V
Lead Channel Setting Pacing Amplitude: 2 V
Lead Channel Setting Pacing Pulse Width: 0.5 ms
Lead Channel Setting Sensing Sensitivity: 2 mV
Pulse Gen Model: 2272
Pulse Gen Serial Number: 7945283

## 2018-02-20 LAB — HM MAMMOGRAPHY

## 2018-02-20 MED ORDER — RIVAROXABAN 20 MG PO TABS: 20.0000 mg | ORAL_TABLET | Freq: Every day | ORAL | 0 refills | Status: DC

## 2018-02-20 MED ORDER — BISOPROLOL FUMARATE 5 MG PO TABS: 5.0000 mg | ORAL_TABLET | Freq: Every day | ORAL | 3 refills | Status: DC

## 2018-02-20 MED ORDER — RIVAROXABAN 20 MG PO TABS: 20.0000 mg | ORAL_TABLET | Freq: Every day | ORAL | 6 refills | Status: DC

## 2018-02-20 NOTE — Progress Notes (Signed)
Electrophysiology Office Note   Date:  02/20/2018   ID:  Saryiah, Bencosme 1945-12-08, MRN [id number]  PCP:  Ria Bush, MD  Cardiologist:  [place] Primary Electrophysiologist:  Joban Colledge Meredith Leeds, MD    Chief Complaint  Patient presents with  . Pacemaker Check    Symptomatic bradycardia     History of Present Illness: [name] is a 72 y.o. female who presents today for electrophysiology evaluation.   She presented to the hospital in September 2017 with progressive exercise intolerance and shortness of breath and was found to be in 2:1 AV block. She has St. Jude dual-chamber pacemaker implanted at that time.   Today, denies symptoms of palpitations, shortness of breath, orthopnea, PND, lower extremity edema, claudication, dizziness, presyncope, syncope, bleeding, or neurologic sequela. The patient is tolerating medications without difficulties.  Ivor Costa, she had an episode of chest pain.  It lasted 40 minutes and went away.  She did not seek medical attention.  Device interrogation also showed prolonged episodes of atrial fibrillation.   Past Medical History:  Diagnosis Date  . Acute cystitis without hematuria 12/22/2015  . Anemia   . Arthritis   . CAP (community acquired pneumonia) 02/08/2016  . Cataracts, bilateral    immature  . Chronic diastolic heart failure ([place]) 09/19/2014  . Chronic insomnia   . Constipation    takes Miralax daily as needed  . COPD (chronic obstructive pulmonary disease) (HCC)    Albuterol inhaler daily as needed;Duoneb daily as needed;Spiriva daily  . DDD (degenerative disc disease)    cervical - kyphosis with mod DD changes C5/6 and C6/7; lumbar - early DD at L2/3 ([place])  . Essential hypertension    was on meds but after appointment Dec 11 with Dr.C he took her off  . GERD (gastroesophageal reflux disease)    takes Protonix daily  . History of pneumonia 02/16/2016  . History of radiation therapy 05/30/11 to 07/07/11   rectum  .  History of shingles   . Hyperlipemia    takes Lovastatin daily  . Legally blind in left eye, as defined in [place]   . Lung nodule    RLL nodule-67mm stable 2006, April 2009, and June 2009  . Obesity   . Osteopenia 12/2014   T -1.5 hip  . Pancreatitis 11/2014   ?zpack related vs gallstone pancreatitis with abnormal HIDA scan pending cholecystectomy  . Rectal cancer ([place]) 04/2011   T3N0; s/p lap LAR, s/p ileostomy, s/p reversal, s/p chemo  . Research study patient 08/26/2016  . Rheumatic heart disease mitral stenosis    mod MS by echo 02/2014  . S/P AVR (aortic valve replacement) 2015   bioprosthetic ([place])  . S/P MVR (mitral valve replacement) 2015   bioprosthetic ([place])  . Seizures ([place])    febrile seizures as a child  . Sepsis secondary to UTI ([place]) 08/22/2017  . Severe aortic valve stenosis  AVR 3/15 10/01/2013   mild-mod by echo 02/2014  . Sleep apnea    PT TOLD BORDERLINE-TRIED CPAP-DID NOT HELP-SHE DOES NOT USE CPAP  . Small bowel obstruction, partial ([place]) 08/2013   reolved without NGT placement.   . Vitamin B12 deficiency 02/28/2015   Start B12 shots 02/2015    Past Surgical History:  Procedure Laterality Date  . AORTIC VALVE REPLACEMENT N/A 12/12/2013   Procedure: AORTIC VALVE REPLACEMENT (AVR);  Surgeon: Gaye Pollack, MD; Service: Open Heart Surgery  . BACK SURGERY  2006, 2007   2006 SPACER, 2007  decompression and fusion L4/5  . BOWEL RESECTION  04/02/2012   Procedure: SMALL BOWEL RESECTION;  Surgeon: Stark Klein, MD;  Location: [facility];  Service: General;  Laterality: N/A;  . CARPAL TUNNEL RELEASE Bilateral   . CATARACT EXTRACTION W/ INTRAOCULAR LENS IMPLANT Left 10/18/2016  . CATARACT EXTRACTION W/ INTRAOCULAR LENS IMPLANT Right 12/13/2016   Dr. Kathrin Penner  . CHOLECYSTECTOMY N/A 01/21/2015   chronic cholecystitis, Stark Klein, MD  . COLON RESECTION  08/25/2011   Procedure: COLON RESECTION LAPAROSCOPIC;  Surgeon: Stark Klein, MD;  Location: [facility];  Service:  General;  Laterality: N/A;  Laparoscopic Assisted Low Anterior Resection Diverting Ostomy and onQ pain pump  . COLONOSCOPY  03/2013   1 polyp, rpt 3 yrs Ardis Hughs)  . COLONOSCOPY WITH PROPOFOL N/A 06/09/2016   patent colo-colonic anastomosis, rpt 5 yrs Ardis Hughs)  . EP IMPLANTABLE DEVICE N/A 06/16/2016   Procedure: Pacemaker Implant;  Surgeon: Kemon Devincenzi Meredith Leeds, MD;  Location: [facility];  Service: Cardiovascular;  Laterality: N/A;  . FOOT SURGERY  left foot   hammer toe  . HAND SURGERY  Bil   carpal tunnel  . ILEOSTOMY  08/25/2011  . ILEOSTOMY CLOSURE  04/02/2012   Procedure: ILEOSTOMY TAKEDOWN;  Surgeon: Stark Klein, MD;  Location: [facility];  Service: General;  Laterality: N/A;  . INTRAOPERATIVE TRANSESOPHAGEAL ECHOCARDIOGRAM N/A 12/12/2013   Procedure: INTRAOPERATIVE TRANSESOPHAGEAL ECHOCARDIOGRAM;  Surgeon: Gaye Pollack, MD;  Location: [facility];  Service: Open Heart Surgery;  Laterality: N/A;  . LEFT AND RIGHT HEART CATHETERIZATION WITH CORONARY ANGIOGRAM N/A 11/27/2013   Procedure: LEFT AND RIGHT HEART CATHETERIZATION WITH CORONARY ANGIOGRAM;  Surgeon: Sanda Klein, MD;  Location: [place] [facility];  Service: Cardiovascular;  Laterality: N/A;  . MITRAL VALVE REPLACEMENT N/A 12/12/2013   Procedure: MITRAL VALVE (MV) REPLACEMENT OR REPAIR;  Surgeon: Gaye Pollack, MD; Service: Open Heart Surgery  . TEE WITHOUT CARDIOVERSION N/A 11/29/2013   Procedure: TRANSESOPHAGEAL ECHOCARDIOGRAM (TEE);  Surgeon: Sueanne Margarita, MD;  Location[facility];  Service: Cardiovascular;  Laterality: N/A;  . TONSILLECTOMY       Current Outpatient Medications  Medication Sig Dispense Refill  . acetaminophen (TYLENOL) 325 MG tablet Take 325 mg by mouth as needed for mild pain or headache (2 tablets as needed).    Marland Kitchen albuterol (PROVENTIL HFA;VENTOLIN HFA) 108 (90 Base) MCG/ACT inhaler Inhale 2 puffs into the lungs every 6 (six) hours as needed for wheezing. 1 Inhaler 2  . amoxicillin (AMOXIL) 500 MG capsule take 4  capsules by mouth 30-60 MINUTES PRIOR TO DENTAL APPT 4 capsule 12  . BIOTIN PO Take 1 tablet by mouth at bedtime.     . Calcium Carbonate-Vitamin D (CALCIUM 600 + D PO) Take 600 mg by mouth at bedtime.     . Cholecalciferol (VITAMIN D) 2000 UNITS CAPS Take 2,000 Units by mouth at bedtime.     . fluticasone furoate-vilanterol (BREO ELLIPTA) 200-25 MCG/INH AEPB Inhale 1 puff into the lungs daily. 60 each 8  . furosemide (LASIX) 40 MG tablet TAKE 1 TABLET BY MOUTH EVERY DAY 5 TIMES A WEEK 20 tablet 11  . ipratropium-albuterol (DUONEB) 0.5-2.5 (3) MG/3ML SOLN Take 3 mLs by nebulization every 6 (six) hours as needed. 360 mL 0  . lovastatin (MEVACOR) 40 MG tablet TAKE 1 TABLET BY MOUTH AT BEDTIME 30 tablet 4  . Miconazole Nitrate 2 % POWD Apply 1 application topically daily as needed (for skin). Apply to stomach and under breasts    . montelukast (  SINGULAIR) 10 MG tablet TAKE 1 TABLET BY MOUTH AT BEDTIME 30 tablet 3  . polycarbophil (FIBERCON) 625 MG tablet Take 625 mg by mouth daily.    . polyethylene glycol (MIRALAX / GLYCOLAX) packet Take 17 g by mouth daily. 14 each 0  . potassium chloride (K-DUR) 10 MEQ tablet take 2 tablets by mouth once daily 60 tablet 5  . SPIRIVA HANDIHALER 18 MCG inhalation capsule INHALE THE CONTENTS OF ONE CAPSULE IN THE HANDIHALER ONCE DAILY 30 capsule 3  . traMADol (ULTRAM) 50 MG tablet Take 0.5-1 tablets (25-50 mg total) by mouth 2 (two) times daily as needed for moderate pain. 30 tablet 0  . vitamin B-12 (CYANOCOBALAMIN) 500 MCG tablet Take 500 mcg by mouth daily.    . bisoprolol (ZEBETA) 5 MG tablet Take 1 tablet (5 mg total) by mouth daily. 30 tablet 3  . rivaroxaban (XARELTO) 20 MG TABS tablet Take 1 tablet (20 mg total) by mouth daily with supper. 30 tablet 0  . rivaroxaban (XARELTO) 20 MG TABS tablet Take 1 tablet (20 mg total) by mouth daily with supper. 30 tablet 6   No current facility-administered medications for this visit.     Allergies:   Sulfa  antibiotics; Codeine; Prednisone; and Sulfonamide derivatives   Social History:  The patient  reports that she quit smoking about 43 years ago. Her smoking use included cigarettes. She has a 15.00 pack-year smoking history. She has never used smokeless tobacco. She reports that she drinks about 1.2 oz of alcohol per week. She reports that she does not use drugs.   Family History:  The patient's family history includes Breast cancer (age of onset: 75) in her maternal aunt; CAD (age of onset: 45) in her son; Diabetes in her mother; Heart attack in her maternal grandfather; Heart attack (age of onset: 94) in her son; Heart disease in her mother; Hypertension in her mother; Lung cancer in her father; Stroke in her mother.    ROS:  Please see the history of present illness.   Otherwise, review of systems is positive for fatigue, chest pain, palpitations, shortness of breath, wheezing.   All other systems are reviewed and negative.   PHYSICAL EXAM: VS:  BP 110/78   Pulse 99   Ht 5\' 6"  (1.676 m)   Wt 187 lb 6.4 oz (85 kg)   SpO2 95%   BMI 30.25 kg/m  , BMI Body mass index is 30.25 kg/m. GEN: Well nourished, well developed, in no acute distress  HEENT: normal  Neck: no JVD, carotid bruits, or masses Cardiac: RRR; no murmurs, rubs, or gallops,no edema  Respiratory:  clear to auscultation bilaterally, normal work of breathing GI: soft, nontender, nondistended, + BS MS: no deformity or atrophy  Skin: warm and dry, device site well healed Neuro:  Strength and sensation are intact Psych: euthymic mood, full affect  EKG:  EKG is not ordered today. Personal review of the ekg ordered 12/27/17 shows SR, RBBB, LPFB  Personal review of the device interrogation today. Results in [place]: 01/22/2018: Hemoglobin 16.4; Platelets 131.0 02/05/2018: ALT 64; BUN 18; Creatinine, Ser 0.84; Potassium 4.4; Sodium 140    Lipid Panel     Component Value Date/Time   CHOL 147 11/15/2017 1001    TRIG 95.0 11/15/2017 1001   HDL 51.20 11/15/2017 1001   CHOLHDL 3 11/15/2017 1001   VLDL 19.0 11/15/2017 1001   LDLCALC 77 11/15/2017 1001   LDLDIRECT 111.0 06/26/2015 0928  Wt Readings from Last 3 Encounters:  02/20/18 187 lb 6.4 oz (85 kg)  02/05/18 191 lb 3.2 oz (86.7 kg)  01/22/18 199 lb (90.3 kg)      Other studies Reviewed: Additional studies/ records that were reviewed today include: TTE 06/29/16  Review of the above records today demonstrates:  - Left ventricle: The cavity size was normal. Wall thickness was   increased in a pattern of mild LVH. Systolic function was normal.   The estimated ejection fraction was in the range of 60% to 65%.   Indeterminant diastolic function. Wall motion was normal; there   were no regional wall motion abnormalities. - Aortic valve: There was a bioprosthetic aortic valve. There did   not appear to be significant regurgitation or stenosis. Mean   gradient (S): 17 mm Hg. - Mitral valve: There was a bioprosthetic mitral valve. There did   not appear to be significant stenosis or regurgitation. The   residual native mitral apparatus is severely calcified. Mean   gradient (D): 6 mm Hg. Valve area by pressure half-time: 1.8   cm^2. - Left atrium: The atrium was mildly dilated. - Right ventricle: The cavity size was normal. Systolic function   was normal. - Tricuspid valve: Peak RV-RA gradient (S): 23 mm Hg. - Pulmonary arteries: PA peak pressure: 26 mm Hg (S). - Inferior vena cava: The vessel was normal in size. The   respirophasic diameter changes were in the normal range (= 50%),   consistent with normal central venous pressure.   ASSESSMENT AND PLAN:  1.  2:1 AV block: Status post Saint Jude dual-chamber pacemaker.  Device functioning appropriately.  VIP is currently on.  No changes.    2. Diastolic heart failure: Currently on bisoprolol and Lasix.  No signs of volume overload.  3. Rheumatic heart disease: This post mitral and  aortic valve replacement.  Echo is currently pending.  No changes.  4. Hyperlipidemia: Continue lovastatin  5. Paroxysmal atrial fibrillation: We Yalanda Soderman increase her bisoprolol to 5 mg.  This may need to be increased to 10 mg, as her resting heart rate is quite fast and she gets short of breath.  We Anneka Studer start her on Xarelto today due to her atrial fibrillation.  This patients CHA2DS2-VASc Score and unadjusted Ischemic Stroke Rate (% per year) is equal to 2.2 % stroke rate/year from a score of 2  Above score calculated as 1 point each if present [CHF, HTN, DM, Vascular=MI/PAD/Aortic Plaque, Age if 65-74, or Female] Above score calculated as 2 points each if present [Age > 75, or Stroke/TIA/TE]  6.  Chest pain: Pain is concerning for cardiac ischemia.  Jakita Dutkiewicz order a Myoview today.  Current medicines are reviewed at length with the patient today.   The patient does not have concerns regarding her medicines.  The following changes were made today: Increase bisoprolol, start Xarelto  Labs/ tests ordered today include:  Orders Placed This Encounter  Procedures  . Myocardial Perfusion Imaging  . CUP PACEART INCLINIC DEVICE CHECK     Disposition:   FU with Renan Danese 3 months  Signed, Traevon Meiring Meredith Leeds, MD  02/20/2018 4:53 PM     [address] [place]  [address] [phone] (office) [phone] (fax)

## 2018-02-20 NOTE — Patient Instructions (Addendum)
Medication Instructions:  Your physician has recommended you make the following change in your medication:  1. INCREASE Bisoprolol to 5 mg daily 2. START Xarelto 20 mg once daily 3. STOP Aspirin  Labwork: None ordered  Testing/Procedures: Your physician has requested that you have a lexiscan myoview. For further information please visit [email or web address]. Please follow instruction sheet, as given.  Follow-Up: Your physician recommends that you schedule a follow-up appointment in: 3 months with Dr. Curt Bears.   * If you need a refill on your cardiac medications before your next appointment, please call your pharmacy.   *Please note that any paperwork needing to be filled out by the provider will need to be addressed at the front desk prior to seeing the provider. Please note that any FMLA, disability or other documents regarding health condition is subject to a $25.00 charge that must be received prior to completion of paperwork in the form of a money order or check.  Thank you for choosing [facility]!!   Trinidad Curet, RN [phone]  Any Other Special Instructions Will Be Listed Below (If Applicable).

## 2018-02-21 ENCOUNTER — Encounter: Payer: Self-pay | Admitting: Family Medicine

## 2018-02-22 ENCOUNTER — Encounter (HOSPITAL_COMMUNITY): Payer: Self-pay

## 2018-02-22 ENCOUNTER — Telehealth (HOSPITAL_COMMUNITY): Payer: Self-pay | Admitting: *Deleted

## 2018-02-22 NOTE — Telephone Encounter (Signed)
Patient given detailed instructions per Myocardial Perfusion Study Information Sheet for the test on 02/26/18 Patient notified to arrive 15 minutes early and that it is imperative to arrive on time for appointment to keep from having the test rescheduled.  If you need to cancel or reschedule your appointment, please call the office within 24 hours of your appointment. . Patient verbalized understanding. [name]    

## 2018-02-26 ENCOUNTER — Ambulatory Visit (HOSPITAL_COMMUNITY): Payer: Medicare Other | Attending: Cardiology

## 2018-02-26 VITALS — Ht 66.0 in | Wt 187.0 lb

## 2018-02-26 DIAGNOSIS — I10 Essential (primary) hypertension: Secondary | ICD-10-CM | POA: Diagnosis not present

## 2018-02-26 DIAGNOSIS — R110 Nausea: Secondary | ICD-10-CM

## 2018-02-26 DIAGNOSIS — R0609 Other forms of dyspnea: Secondary | ICD-10-CM | POA: Diagnosis not present

## 2018-02-26 DIAGNOSIS — R002 Palpitations: Secondary | ICD-10-CM | POA: Insufficient documentation

## 2018-02-26 DIAGNOSIS — R0789 Other chest pain: Secondary | ICD-10-CM | POA: Insufficient documentation

## 2018-02-26 LAB — MYOCARDIAL PERFUSION IMAGING
LV dias vol: 98 mL (ref 46–106)
LV sys vol: 42 mL
Peak HR: 85 {beats}/min
RATE: 0.32
Rest HR: 60 {beats}/min
SDS: 2
SRS: 6
SSS: 8
TID: 1.04

## 2018-02-26 MED ORDER — AMINOPHYLLINE 25 MG/ML IV SOLN
75.0000 mg | Freq: Once | INTRAVENOUS | Status: AC
  Administered 2018-02-26: 75 mg via INTRAVENOUS

## 2018-02-26 MED ORDER — REGADENOSON 0.4 MG/5ML IV SOLN
0.4000 mg | Freq: Once | INTRAVENOUS | Status: AC
  Administered 2018-02-26: 0.4 mg via INTRAVENOUS

## 2018-02-26 MED ORDER — TECHNETIUM TC 99M TETROFOSMIN IV KIT
32.6000 | PACK | Freq: Once | INTRAVENOUS | Status: AC | PRN
  Administered 2018-02-26: 32.6 via INTRAVENOUS
  Filled 2018-02-26: qty 33

## 2018-02-26 MED ORDER — TECHNETIUM TC 99M TETROFOSMIN IV KIT
10.8000 | PACK | Freq: Once | INTRAVENOUS | Status: AC | PRN
  Administered 2018-02-26: 10.8 via INTRAVENOUS
  Filled 2018-02-26: qty 11

## 2018-02-27 ENCOUNTER — Encounter (HOSPITAL_COMMUNITY): Payer: Self-pay

## 2018-03-01 ENCOUNTER — Encounter (HOSPITAL_COMMUNITY): Payer: Self-pay

## 2018-03-06 ENCOUNTER — Encounter (HOSPITAL_COMMUNITY): Payer: Self-pay

## 2018-03-07 ENCOUNTER — Ambulatory Visit (INDEPENDENT_AMBULATORY_CARE_PROVIDER_SITE_OTHER)
Admission: RE | Admit: 2018-03-07 | Discharge: 2018-03-07 | Disposition: A | Discharge status: Home / Self Care | Payer: Medicare Other | Source: Ambulatory Visit | Attending: Internal Medicine | Admitting: Internal Medicine

## 2018-03-07 DIAGNOSIS — J449 Chronic obstructive pulmonary disease, unspecified: Secondary | ICD-10-CM

## 2018-03-08 ENCOUNTER — Encounter (HOSPITAL_COMMUNITY): Payer: Self-pay

## 2018-03-08 DIAGNOSIS — M47819 Spondylosis without myelopathy or radiculopathy, site unspecified: Secondary | ICD-10-CM

## 2018-03-08 HISTORY — DX: Spondylosis without myelopathy or radiculopathy, site unspecified: M47.819

## 2018-03-13 ENCOUNTER — Ambulatory Visit (INDEPENDENT_AMBULATORY_CARE_PROVIDER_SITE_OTHER): Payer: Medicare Other | Admitting: Internal Medicine

## 2018-03-13 ENCOUNTER — Encounter: Payer: Self-pay | Admitting: Internal Medicine

## 2018-03-13 ENCOUNTER — Encounter (HOSPITAL_COMMUNITY): Payer: Self-pay

## 2018-03-13 VITALS — BP 120/86 | HR 86 | Ht 66.0 in | Wt 184.0 lb

## 2018-03-13 DIAGNOSIS — Z952 Presence of prosthetic heart valve: Secondary | ICD-10-CM | POA: Insufficient documentation

## 2018-03-13 DIAGNOSIS — J449 Chronic obstructive pulmonary disease, unspecified: Secondary | ICD-10-CM

## 2018-03-13 DIAGNOSIS — I441 Atrioventricular block, second degree: Secondary | ICD-10-CM | POA: Insufficient documentation

## 2018-03-13 DIAGNOSIS — R918 Other nonspecific abnormal finding of lung field: Secondary | ICD-10-CM

## 2018-03-13 DIAGNOSIS — I5032 Chronic diastolic (congestive) heart failure: Secondary | ICD-10-CM | POA: Insufficient documentation

## 2018-03-13 LAB — PULMONARY FUNCTION TEST
DL/VA % pred: 102 %
DL/VA: 5.18 ml/min/mmHg/L
DLCO cor % pred: 84 %
DLCO cor: 22.7 ml/min/mmHg
DLCO unc % pred: 91 %
DLCO unc: 24.55 ml/min/mmHg
FEF 25-75 Pre: 0.73 L/sec
FEF2575-%Pred-Pre: 38 %
FEV1-%Pred-Pre: 59 %
FEV1-Pre: 1.42 L
FEV1FVC-%Pred-Pre: 85 %
FEV6-%Pred-Pre: 73 %
FEV6-Pre: 2.21 L
FEV6FVC-%Pred-Pre: 105 %
FVC-%Pred-Pre: 70 %
FVC-Pre: 2.21 L
Pre FEV1/FVC ratio: 64 %
Pre FEV6/FVC Ratio: 100 %

## 2018-03-13 NOTE — Progress Notes (Signed)
Subjective:     Patient ID: [name], female   DOB: 31-Oct-1945, 72 y.o.   MRN: [id number]  HPI   PCP Ria Bush, MD  HPI    OV 08/02/2017  Chief Complaint  Patient presents with  . Follow-up    Pt states that she got bronchitis x1 month ago. Has been on two rounds of abx and two rounds of steroids. States that it is not in her chest anymore but is still having drainage, sore throat, dry cough, occ. SOB. Denies any CP.    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 She comes for routine follow-up. Recently she had COPD flareup was treated by primary care. Then I called in antibiotic and repeat prednisone and this again helped her. Nevertheless she is frustrated by postnasal drainage and the resultant cough. She says normally for COPD exacerbations are associated with initial onset of sinus congestion. Then she's always left with a residual postnasal drip but this time it has lingered longer. She says Sudafed helps her but is no longer available over-the-counter. She really wants relief about this critically. COPD cat score is 14. Is also asking me to sign handicap parking sticker   OV 10/31/2017  Chief Complaint  Patient presents with  . Follow-up    In hosp. 11/13-11/15 due to sepsis from UTI. Pt states she is doing good but can't seem to get her energy back. Pt currently has laryngitis and SOB with exertion.    Follow-up Gold stage II/III COPD not on home oxygen but on triple inhaler therapy.  Is a routine follow-up.  Since I last saw her in the middle of November 2018 she got diagnosed with E. coli sepsis and admitted to the hospital.  Since then she has significant amount of fatigue.  The fatigue is not improved much.  She has suspended her maintenance pulmonary rehabilitation but plans to restart it in March 2019.  She has not had any COPD exacerbations.  She is up-to-date with the flu shot.  Overall COPD CAT score is stable but there is  an increase in the level of fatigue.  The only new issue that she complains about is some mild hoarseness in voice going on for several months.  This is present early in the morning when she wakes up and then as the day goes on it is better.  She notices it when she talks a lot of time in the phone after a long period of rest.  It is insidious onset.  It is not progressive.  There are no other associated symptoms.   OV 02/05/2018  Chief Complaint  Patient presents with  . Follow-up    Pt states she saw PCP two weeks ago and had pna. Pt states she is still not over the pna and has no energy, has hoarseness in voice, cough, and has chest tightness.     Follow-up gold stage II/III COPD not on home oxygen but on triple inhaler therapy with Singulair  Routine follow-up.  She tells me t of labs from January 22, 2018 hat January 22, 2018 she started feeling chest tightness wheezing and also increase head cold.  She saw her primary care physician Dr. Danise Mina.  Apparently all the blood work was normal except for mild increase in liver enzymes which she says she fluctuates with.  He was initially thinking a sinus congestion but when he did an x-ray of the report came back as a right lower  lobe pneumonia [I personally visualized this x-ray and I agree she might not have right lower lobe pneumonia because in 2015 CT chest she had some chronic scarring at that area].  She was treated with 5-day Levaquin and a Medrol Dosepak but despite this other than subjective fevers are resolving she still has significant cough congestion and chest tightness without any orthopnea proximal nocturnal dyspnea.  COPD CAT score shows significant deterioration.  She is not feeling well.  Of note, she is intentionally losing weight because of weight watchers but she has not increased her fluid intake adequately.  She has quit all diet soda  Show slight increase in liver enzymes and also slight increase in creatinine compared to 6 months  ago.   OV 03/13/2018  Chief Complaint  Patient presents with  . Follow-up    Breathing is improved since last OV. Still reports DOE and occasional cough. Denies chest tightness or wheezing.    [name]  presents for follow-up of her gold stage II /3 of COPD.  On triple inhaler therapy with Spiriva and Brio and also on Singulair   Overall in terms of her COPD: She is stable.  She is compliant with her inhalers.  She is not on oxygen therapy.  Her symptoms actually improved.  In the interim she was diagnosed with atrial fibrillation and she is placed on Xarelto.  She is frustrated because she has to be on 1 more extra medication.  She did have pulmonary function test that is documented below because it has been a while since we did one.  It actually shows improvement in the lung function.  She also had a CT scan of the chest because the last one was in 2015.  This shows scattered lung nodules with the largest one 8 mm in the right lower lobe.  The radiologist has described this is nonspecific in appearance.  She heard the result and she has been extremely anxious and upset.  She is worried that her colon cancer has metastasized or she has lung cancer.  Of note, I personally visualized the CT chest and agree with the findings   CAT COPD Symptom & Quality of Life Score ([place]) 0 is no burden. 5 is highest burden 08/02/2017  10/31/2017  02/05/2018   Never Cough -> Cough all the time 1 2 4   No phlegm in chest -> Chest is full of phlegm 0 0 3  No chest tightness -> Chest feels very tight 1 0 3  No dyspnea for 1 flight stairs/hill -> Very dyspneic for 1 flight of stairs 5 4 5   No limitations for ADL at home -> Very limited with ADL at home 1 0 5  Confident leaving home -> Not at all confident leaving home 0 0 3  Sleep soundly -> Do not sleep soundly because of lung condition 4 4 3   Lots of Energy -> No energy at all 2 3 5   TOTAL Score (max 40)  14 13 31    Results for [name], [name]  (MRN [id number]) as of 03/13/2018 11:58  Ref. Range 11/29/2013 16:04 04/01/2016 10:10 03/13/2018 10:36  FEV1-Pre Latest Units: L 0.93 1.35 1.42  FEV1-%Pred-Pre Latest Units: % 34 55 59   Results for [name], [name] (MRN [id number]) as of 03/13/2018 11:58  Ref. Range 03/13/2018 10:36  DLCO cor Latest Units: ml/min/mmHg 22.70  DLCO cor % pred Latest Units: % 84    IMPRESSION: - May2019 v 2015 CT 1. Previously noted areas  of scarring within the right upper lobe are stable from comparison exam. Also stable is chronic pleural-parenchymal scarring overlying the posterior right lower lobe. 2. Several new nodules are identified which have a nonspecific appearance. The largest is within the right lower lobe along the major fissure with a mean diameter of 8.4 mm. Non-contrast chest CT at 3-6 months is recommended. If the nodules are stable at time of repeat CT, then future CT at 18-24 months (from today's scan) is considered optional for low-risk patients, but is recommended for high-risk patients. This recommendation follows the consensus statement: Guidelines for Management of Incidental Pulmonary Nodules Detected on CT Images: From the Fleischner Society 2017; Radiology 2017; 284:228-243. 3. Several new clusters of peripheral tree-in-bud nodularity in the right upper lobe likely reflect post infectious/inflammatory bronchiolitis. 4.  Aortic Atherosclerosis (ICD10-I70.0).   Electronically Signed   By: Kerby Moors [name]   On: 03/07/2018 11:24  has a past medical history of Acute cystitis without hematuria (12/22/2015), Anemia, Arthritis, CAP (community acquired pneumonia) (02/08/2016), Cataracts, bilateral, Chronic diastolic heart failure ([place]) (09/19/2014), Chronic insomnia, Constipation, COPD (chronic obstructive pulmonary disease) ([place]), DDD (degenerative disc disease), Essential hypertension, GERD (gastroesophageal reflux disease), History of pneumonia (02/16/2016), History of radiation therapy  (05/30/11 to 07/07/11), History of shingles, Hyperlipemia, Legally blind in left eye, as defined in [place], Lung nodule, Obesity, Osteopenia (12/2014), Pancreatitis (11/2014), Rectal cancer ([place]) (04/2011), Research study patient (08/26/2016), Rheumatic heart disease mitral stenosis, S/P AVR (aortic valve replacement) (2015), S/P MVR (mitral valve replacement) (2015), Seizures ([place]), Sepsis secondary to UTI ([place]) (08/22/2017), Severe aortic valve stenosis  AVR 3/15 (10/01/2013), Sleep apnea, Small bowel obstruction, partial ([place]) (08/2013), and Vitamin B12 deficiency (02/28/2015).   reports that she quit smoking about 43 years ago. Her smoking use included cigarettes. She has a 15.00 pack-year smoking history. She has never used smokeless tobacco.  Past Surgical History:  Procedure Laterality Date  . AORTIC VALVE REPLACEMENT N/A 12/12/2013   Procedure: AORTIC VALVE REPLACEMENT (AVR);  Surgeon: Gaye Pollack, MD; Service: Open Heart Surgery  . BACK SURGERY  2006, 2007   2006 SPACER, 2007 decompression and fusion L4/5  . BOWEL RESECTION  04/02/2012   Procedure: SMALL BOWEL RESECTION;  Surgeon: Stark Klein, MD;  Location: [facility];  Service: General;  Laterality: N/A;  . CARPAL TUNNEL RELEASE Bilateral   . CATARACT EXTRACTION W/ INTRAOCULAR LENS IMPLANT Left 10/18/2016  . CATARACT EXTRACTION W/ INTRAOCULAR LENS IMPLANT Right 12/13/2016   Dr. Kathrin Penner  . CHOLECYSTECTOMY N/A 01/21/2015   chronic cholecystitis, Stark Klein, MD  . COLON RESECTION  08/25/2011   Procedure: COLON RESECTION LAPAROSCOPIC;  Surgeon: Stark Klein, MD;  Location: [facility];  Service: General;  Laterality: N/A;  Laparoscopic Assisted Low Anterior Resection Diverting Ostomy and onQ pain pump  . COLONOSCOPY  03/2013   1 polyp, rpt 3 yrs Ardis Hughs)  . COLONOSCOPY WITH PROPOFOL N/A 06/09/2016   patent colo-colonic anastomosis, rpt 5 yrs Ardis Hughs)  . EP IMPLANTABLE DEVICE N/A 06/16/2016   Procedure: Pacemaker Implant;  Surgeon: Will Meredith Leeds, MD;  Location: [facility];  Service: Cardiovascular;  Laterality: N/A;  . FOOT SURGERY  left foot   hammer toe  . HAND SURGERY  Bil   carpal tunnel  . ILEOSTOMY  08/25/2011  . ILEOSTOMY CLOSURE  04/02/2012   Procedure: ILEOSTOMY TAKEDOWN;  Surgeon: Stark Klein, MD;  Location: [facility];  Service: General;  Laterality: N/A;  . INTRAOPERATIVE TRANSESOPHAGEAL ECHOCARDIOGRAM N/A 12/12/2013   Procedure: INTRAOPERATIVE TRANSESOPHAGEAL ECHOCARDIOGRAM;  Surgeon: Gaye Pollack, MD;  Location: [facility];  Service: Open Heart Surgery;  Laterality: N/A;  . LEFT AND RIGHT HEART CATHETERIZATION WITH CORONARY ANGIOGRAM N/A 11/27/2013   Procedure: LEFT AND RIGHT HEART CATHETERIZATION WITH CORONARY ANGIOGRAM;  Surgeon: Sanda Klein, MD;  Location: [facility];  Service: Cardiovascular;  Laterality: N/A;  . MITRAL VALVE REPLACEMENT N/A 12/12/2013   Procedure: MITRAL VALVE (MV) REPLACEMENT OR REPAIR;  Surgeon: Gaye Pollack, MD; Service: Open Heart Surgery  . TEE WITHOUT CARDIOVERSION N/A 11/29/2013   Procedure: TRANSESOPHAGEAL ECHOCARDIOGRAM (TEE);  Surgeon: Sueanne Margarita, MD;  Location: [facility];  Service: Cardiovascular;  Laterality: N/A;  . TONSILLECTOMY      Allergies  Allergen Reactions  . Sulfa Antibiotics   . Codeine Nausea Only  . Prednisone Hives    In different places all over the body.  . Sulfonamide Derivatives Nausea Only    Immunization History  Administered Date(s) Administered  . H1N1 09/17/2008  . Influenza Split 07/18/2012  . Influenza Whole 08/10/2005, 06/24/2009, 07/11/2011  . Influenza, High Dose Seasonal PF 07/04/2016, 07/04/2017  . Influenza-Unspecified 07/24/2013, 06/30/2014, 06/30/2015  . Pneumococcal Conjugate-13 06/02/2014  . Pneumococcal Polysaccharide-23 08/10/2005, 04/21/2008, 06/25/2013  . Td 10/11/2005  . Zoster 09/16/2014    Family History  Problem Relation Age of Onset  . Lung cancer Father   . Heart disease Mother   . Diabetes Mother   .  Hypertension Mother   . Stroke Mother   . CAD Son 58  . Heart attack Son 33  . Heart attack Maternal Grandfather   . Breast cancer Maternal Aunt 83  . Colon cancer Neg Hx   . Esophageal cancer Neg Hx   . Stomach cancer Neg Hx   . Rectal cancer Neg Hx      Current Outpatient Medications:  .  acetaminophen (TYLENOL) 325 MG tablet, Take 325 mg by mouth as needed for mild pain or headache (2 tablets as needed)., Disp: , Rfl:  .  albuterol (PROVENTIL HFA;VENTOLIN HFA) 108 (90 Base) MCG/ACT inhaler, Inhale 2 puffs into the lungs every 6 (six) hours as needed for wheezing., Disp: 1 Inhaler, Rfl: 2 .  amoxicillin (AMOXIL) 500 MG capsule, take 4 capsules by mouth 30-60 MINUTES PRIOR TO DENTAL APPT, Disp: 4 capsule, Rfl: 12 .  BIOTIN PO, Take 1 tablet by mouth at bedtime. , Disp: , Rfl:  .  bisoprolol (ZEBETA) 5 MG tablet, Take 1 tablet (5 mg total) by mouth daily., Disp: 30 tablet, Rfl: 3 .  Calcium Carbonate-Vitamin D (CALCIUM 600 + D PO), Take 600 mg by mouth at bedtime. , Disp: , Rfl:  .  Cholecalciferol (VITAMIN D) 2000 UNITS CAPS, Take 2,000 Units by mouth at bedtime. , Disp: , Rfl:  .  fluticasone furoate-vilanterol (BREO ELLIPTA) 200-25 MCG/INH AEPB, Inhale 1 puff into the lungs daily., Disp: 60 each, Rfl: 8 .  furosemide (LASIX) 40 MG tablet, TAKE 1 TABLET BY MOUTH EVERY DAY 5 TIMES A WEEK, Disp: 20 tablet, Rfl: 11 .  ipratropium-albuterol (DUONEB) 0.5-2.5 (3) MG/3ML SOLN, Take 3 mLs by nebulization every 6 (six) hours as needed., Disp: 360 mL, Rfl: 0 .  lovastatin (MEVACOR) 40 MG tablet, TAKE 1 TABLET BY MOUTH AT BEDTIME, Disp: 30 tablet, Rfl: 4 .  Miconazole Nitrate 2 % POWD, Apply 1 application topically daily as needed (for skin). Apply to stomach and under breasts, Disp: , Rfl:  .  montelukast (SINGULAIR) 10 MG tablet, TAKE 1 TABLET BY MOUTH AT BEDTIME, Disp:  30 tablet, Rfl: 3 .  polycarbophil (FIBERCON) 625 MG tablet, Take 625 mg by mouth daily., Disp: , Rfl:  .  polyethylene glycol  (MIRALAX / GLYCOLAX) packet, Take 17 g by mouth daily., Disp: 14 each, Rfl: 0 .  potassium chloride (K-DUR) 10 MEQ tablet, take 2 tablets by mouth once daily, Disp: 60 tablet, Rfl: 5 .  rivaroxaban (XARELTO) 20 MG TABS tablet, Take 1 tablet (20 mg total) by mouth daily with supper., Disp: 30 tablet, Rfl: 6 .  SPIRIVA HANDIHALER 18 MCG inhalation capsule, INHALE THE CONTENTS OF ONE CAPSULE IN THE HANDIHALER ONCE DAILY, Disp: 30 capsule, Rfl: 3 .  traMADol (ULTRAM) 50 MG tablet, Take 0.5-1 tablets (25-50 mg total) by mouth 2 (two) times daily as needed for moderate pain., Disp: 30 tablet, Rfl: 0 .  vitamin B-12 (CYANOCOBALAMIN) 500 MCG tablet, Take 500 mcg by mouth daily., Disp: , Rfl:    Review of Systems     Objective:   Physical Exam  Constitutional: She is oriented to person, place, and time. She appears well-developed and well-nourished. No distress.  HENT:  Head: Normocephalic and atraumatic.  Right Ear: External ear normal.  Left Ear: External ear normal.  Mouth/Throat: Oropharynx is clear and moist. No oropharyngeal exudate.  Eyes: Pupils are equal, round, and reactive to light. Conjunctivae and EOM are normal. Right eye exhibits no discharge. Left eye exhibits no discharge. No scleral icterus.  Neck: Normal range of motion. Neck supple. No JVD present. No tracheal deviation present. No thyromegaly present.  Cardiovascular: Normal rate, regular rhythm, normal heart sounds and intact distal pulses. Exam reveals no gallop and no friction rub.  No murmur heard. Pulmonary/Chest: Effort normal and breath sounds normal. No respiratory distress. She has no wheezes. She has no rales. She exhibits no tenderness.  Abdominal: Soft. Bowel sounds are normal. She exhibits no distension and no mass. There is no tenderness. There is no rebound and no guarding.  Musculoskeletal: Normal range of motion. She exhibits no edema or tenderness.  Lymphadenopathy:    She has no cervical adenopathy.   Neurological: She is alert and oriented to person, place, and time. She has normal reflexes. No cranial nerve deficit. She exhibits normal muscle tone. Coordination normal.  Skin: Skin is warm and dry. No rash noted. She is not diaphoretic. No erythema. No pallor.  Psychiatric: She has a normal mood and affect. Her behavior is normal. Judgment and thought content normal.  Vitals reviewed. . Today's Vitals   03/13/18 1103 03/13/18 1104  BP:  120/86  Pulse:  86  SpO2:  96%  Weight: 184 lb (83.5 kg)   Height: 5\' 6"  (1.676 m)     Estimated body mass index is 29.7 kg/m as calculated from the following:   Height as of this encounter: 5\' 6"  (1.676 m).   Weight as of this encounter: 184 lb (83.5 kg).     Assessment:       ICD-10-CM   1. Multiple lung nodules on CT R91.8   2. Moderate COPD (chronic obstructive pulmonary disease) (HCC) J44.9        Plan:     Multiple lung nodules on CT-new finding - per radiologist these nodules are new since 2015 but do not have a specific apeparance - my guess is this is low probability for cancer - please repeat CT chest without contrast super D protocol in 3 months  Moderate COPD (chronic obstructive pulmonary disease) ([place]) - stable and infact PFT better over time -  continue spiriv, breo and singulair scheduled with duoneb as needed  Followup  = 3 months but after CT ; cat score at followup    Dr. Brand Males, [name], [facility].C.P Pulmonary and Critical Care Medicine Staff Physician, [place] Director - Interstitial Lung Disease  Program  Pulmonary [place] [facility] [place], [place], [address]  Pager: [phone], If no answer or between  15:00h - 7:00h: call [phone] Telephone: [phone]

## 2018-03-13 NOTE — Progress Notes (Signed)
PFT completed today.  

## 2018-03-13 NOTE — Patient Instructions (Addendum)
Multiple lung nodules on CT - per radiologist these nodules are new since 2015 but do not have a specific apeparance - my guess is this is low probability for cancer - please repeat CT chest without contrast super D protocol in 3 months  Moderate COPD (chronic obstructive pulmonary disease) ([place]) - stable and infact PFT better over time - continue spiriv, breo and singulair scheduled with duoneb as needed  Followup  = 3 months but after CT ; cat score at followup

## 2018-03-14 ENCOUNTER — Telehealth: Payer: Self-pay | Admitting: Cardiology

## 2018-03-14 ENCOUNTER — Telehealth: Payer: Self-pay | Admitting: *Deleted

## 2018-03-14 ENCOUNTER — Other Ambulatory Visit: Payer: Self-pay | Admitting: Neurological Surgery

## 2018-03-14 DIAGNOSIS — M4726 Other spondylosis with radiculopathy, lumbar region: Secondary | ICD-10-CM

## 2018-03-14 NOTE — Telephone Encounter (Signed)
   [facility] Pre-operative Risk Assessment    Request for surgical clearance:  1. What type of surgery is being performed? Lumbar Myelogram/CT   2. When is this surgery scheduled? Once cleared  3. What type of clearance is required (medical clearance vs. Pharmacy clearance to hold med vs. Both)? Both  4. Are there any medications that need to be held prior to surgery and how long?Patient is on Xarelto and will need to stop prior to procedure; please advise   5. Practice name and name of physician performing surgery? [facility].  Dr. Ellene Route   6. What is your office phone number [phone]    7.   What is your office fax [phone]  8.   Anesthesia type (None, local, MAC, general) ? Not noted.     Please fax last office note with clearance _________________________________________________________________   (provider comments below)

## 2018-03-14 NOTE — Telephone Encounter (Signed)
Spoke with [name], she reports that [facility] will not do her MRI due to her pacemaker even though she told them it was MRI compatible. She has a call in to the ordering physician- I advised her to let them know of the situation and that it may be a facility protocol and she may need to have it done at [facility]. She verbalizes understanding. Direct number given for [facility] if the facility needs documentation of device info.

## 2018-03-14 NOTE — Telephone Encounter (Signed)
New message    [facility] said they can not do the MRI because of her pacemaker please call her

## 2018-03-15 ENCOUNTER — Encounter (HOSPITAL_COMMUNITY): Payer: Self-pay

## 2018-03-16 NOTE — Telephone Encounter (Signed)
Patient with diagnosis of atrial fibrillation on Xarelto for anticoagulation.    Procedure: lumbar myelogram/CT Date of procedure: TBD  CHADS2-VASc score of  4 (CHF, HTN, AGE, , female)  CrCl 79.8 Platelet count 131  Per office protocol, patient can hold Xarelto for 3 days prior to procedure.    Patient should restart Xarelto 48-72 hours after procedure, at discretion of procedure MD

## 2018-03-16 NOTE — Telephone Encounter (Signed)
I spoke with Ms Muraski. No new cardiac issues since OV 5/15/9. I will forward to pharmacy for recommendations on holding Xarelto and need for SBE prophylaxis (tissue AVR and MVR), otherwise she is cleared from cardiac standpoint for myelogram.    Kerin Ransom PA-C 03/16/2018 9:01 AM

## 2018-03-19 NOTE — Telephone Encounter (Signed)
Cardiac clearance was faxed including recs from pharmacy regarding Xarelto.

## 2018-03-20 ENCOUNTER — Encounter (HOSPITAL_COMMUNITY): Payer: Self-pay

## 2018-03-21 ENCOUNTER — Other Ambulatory Visit: Payer: Self-pay | Admitting: Neurological Surgery

## 2018-03-21 ENCOUNTER — Other Ambulatory Visit (HOSPITAL_COMMUNITY): Payer: Self-pay | Admitting: Neurological Surgery

## 2018-03-21 DIAGNOSIS — M4726 Other spondylosis with radiculopathy, lumbar region: Secondary | ICD-10-CM

## 2018-03-21 LAB — CUP PACEART INCLINIC DEVICE CHECK
Date Time Interrogation Session: 20190612163912
Implantable Lead Implant Date: 20170907
Implantable Lead Implant Date: 20170907
Implantable Lead Location: 753859
Implantable Lead Location: 753860
Implantable Pulse Generator Implant Date: 20170907
Lead Channel Setting Pacing Amplitude: 1 V
Lead Channel Setting Pacing Amplitude: 2 V
Lead Channel Setting Pacing Pulse Width: 0.5 ms
Lead Channel Setting Sensing Sensitivity: 2 mV
Pulse Gen Model: 2272
Pulse Gen Serial Number: 7945283

## 2018-03-22 ENCOUNTER — Encounter (HOSPITAL_COMMUNITY): Payer: Self-pay

## 2018-03-23 MED ORDER — SODIUM CHLORIDE 0.9 % IV SOLN: 4.0000 mg | Freq: Four times a day (QID) | INTRAVENOUS | Status: DC | PRN

## 2018-03-27 ENCOUNTER — Encounter (HOSPITAL_COMMUNITY): Payer: Self-pay

## 2018-03-29 ENCOUNTER — Encounter (HOSPITAL_COMMUNITY): Payer: Self-pay

## 2018-04-02 ENCOUNTER — Telehealth: Payer: Self-pay | Admitting: Cardiovascular Disease

## 2018-04-02 ENCOUNTER — Other Ambulatory Visit: Payer: Self-pay | Admitting: Cardiovascular Disease

## 2018-04-02 NOTE — Telephone Encounter (Signed)
This is considered a sterile procedure. No need for ABx MCr

## 2018-04-02 NOTE — Telephone Encounter (Signed)
Spoke with pt, clearance was done but there is no mention regarding antibiotics, will forward to dr croitoru to determine if antibiotics are needed prior to lumbar myelogram.

## 2018-04-02 NOTE — Telephone Encounter (Signed)
Rx request sent to pharmacy.  

## 2018-04-02 NOTE — Telephone Encounter (Signed)
Pt is to have a Myelogram done and needs to know if she is to have abx prior/ Dr. Clarice Pole office only told her to hold her [place] advise

## 2018-04-02 NOTE — Telephone Encounter (Signed)
Spoke with pt, aware of dr croitoru's recommendations.  

## 2018-04-03 ENCOUNTER — Encounter (HOSPITAL_COMMUNITY)
Admission: RE | Admit: 2018-04-03 | Discharge: 2018-04-03 | Disposition: A | Discharge status: Home / Self Care | Payer: Medicare Other | Source: Ambulatory Visit | Attending: Internal Medicine | Admitting: Internal Medicine

## 2018-04-05 ENCOUNTER — Encounter (HOSPITAL_COMMUNITY)
Admission: RE | Admit: 2018-04-05 | Discharge: 2018-04-05 | Disposition: A | Discharge status: Home / Self Care | Payer: Self-pay | Source: Ambulatory Visit | Attending: Internal Medicine | Admitting: Internal Medicine

## 2018-04-05 NOTE — Progress Notes (Signed)
[name] has decided to drop from the Pulmonary Maintenance program. She has not been able to attend in June due to medical issues. She now has a pending Myogram July 11th. [name] plans to restart the program as soon as she is medically stable.

## 2018-04-10 ENCOUNTER — Encounter (HOSPITAL_COMMUNITY): Payer: Self-pay

## 2018-04-17 ENCOUNTER — Encounter (HOSPITAL_COMMUNITY): Payer: Self-pay

## 2018-04-19 ENCOUNTER — Encounter (HOSPITAL_COMMUNITY): Payer: Self-pay

## 2018-04-19 ENCOUNTER — Ambulatory Visit (HOSPITAL_COMMUNITY): Payer: Medicare Other

## 2018-04-19 ENCOUNTER — Ambulatory Visit (HOSPITAL_COMMUNITY)
Admission: RE | Admit: 2018-04-19 | Discharge: 2018-04-19 | Disposition: A | Discharge status: Home / Self Care | Payer: Medicare Other | Source: Ambulatory Visit | Attending: Neurological Surgery | Admitting: Neurological Surgery

## 2018-04-19 ENCOUNTER — Other Ambulatory Visit: Payer: Self-pay

## 2018-04-19 DIAGNOSIS — M48061 Spinal stenosis, lumbar region without neurogenic claudication: Secondary | ICD-10-CM | POA: Diagnosis not present

## 2018-04-19 DIAGNOSIS — M4726 Other spondylosis with radiculopathy, lumbar region: Secondary | ICD-10-CM | POA: Diagnosis not present

## 2018-04-19 DIAGNOSIS — M5126 Other intervertebral disc displacement, lumbar region: Secondary | ICD-10-CM | POA: Diagnosis not present

## 2018-04-19 DIAGNOSIS — M4186 Other forms of scoliosis, lumbar region: Secondary | ICD-10-CM | POA: Insufficient documentation

## 2018-04-19 DIAGNOSIS — Z981 Arthrodesis status: Secondary | ICD-10-CM | POA: Insufficient documentation

## 2018-04-19 MED ORDER — IOPAMIDOL (ISOVUE-M 200) INJECTION 41%
INTRAMUSCULAR | Status: AC
  Administered 2018-04-19: 12 mL via INTRA_ARTICULAR
  Filled 2018-04-19: qty 10

## 2018-04-19 MED ORDER — DIAZEPAM 5 MG PO TABS
ORAL_TABLET | ORAL | Status: AC
  Filled 2018-04-19: qty 2

## 2018-04-19 MED ORDER — IOPAMIDOL (ISOVUE-M 200) INJECTION 41%
20.0000 mL | Freq: Once | INTRAMUSCULAR | Status: AC
  Administered 2018-04-19: 12 mL via INTRA_ARTICULAR

## 2018-04-19 MED ORDER — ONDANSETRON HCL 4 MG/2ML IJ SOLN: 4.0000 mg | Freq: Four times a day (QID) | INTRAMUSCULAR | Status: DC | PRN

## 2018-04-19 MED ORDER — DIAZEPAM 5 MG PO TABS
10.0000 mg | ORAL_TABLET | Freq: Once | ORAL | Status: AC
  Administered 2018-04-19: 10 mg via ORAL
  Filled 2018-04-19: qty 2

## 2018-04-19 MED ORDER — LIDOCAINE HCL (PF) 1 % IJ SOLN
5.0000 mL | Freq: Once | INTRAMUSCULAR | Status: AC
  Administered 2018-04-19: 2 mL via INTRADERMAL

## 2018-04-19 MED ORDER — LIDOCAINE HCL (PF) 1 % IJ SOLN
INTRAMUSCULAR | Status: AC
  Administered 2018-04-19: 2 mL via INTRADERMAL
  Filled 2018-04-19: qty 5

## 2018-04-19 MED ORDER — HYDROCODONE-ACETAMINOPHEN 5-325 MG PO TABS
1.0000 | ORAL_TABLET | ORAL | Status: DC | PRN
  Administered 2018-04-19: 1 via ORAL

## 2018-04-19 MED ORDER — HYDROCODONE-ACETAMINOPHEN 5-325 MG PO TABS
ORAL_TABLET | ORAL | Status: AC
  Filled 2018-04-19: qty 1

## 2018-04-19 NOTE — Progress Notes (Signed)
Dr Ellene Route called and asked when pt should re start Xarelto States to re start tomorrow.

## 2018-04-19 NOTE — Procedures (Signed)
[name] is a 72 year old individual who had a decompression and fusion at L4-L5 a number of years ago, and 2007 to be specific.  She did well for a number of years but started to develop a left lumbar radiculopathy several years ago this is persisted and is only gotten worse despite efforts to treat this conservatively.  Plain radiographs demonstrate some adjacent level spondylosis but there is concern now that she has significant stenosis at L2-3 or L3-4 where the other adjacent levels.  She is advised regarding myelography  because of the presence of a pacemaker she cannot undergo an MRI.  Pre op Dx: Lumbar stenosis, history of fusion L4-L5, lumbar radiculopathy Post op Dx: Same, lumbar radiculopathy Procedure: Lumbar myelogram Surgeon: Shyloh Krinke Puncture level: L4-5 Fluid color: Clear colorless Injection: Isovue-200, 12 mL Findings: Moderately severe stenosis and spondylosis L2-3 and L3-4.  Further evaluation with CT scanning.

## 2018-04-19 NOTE — Discharge Instructions (Signed)
Restart Xarelto tomorrow   Myelogram and Lumbar Puncture Discharge Instructions  1. Go home and rest quietly for the next 24 hours.  It is important to lie flat for the next 24 hours.  Get up only to go to the restroom.  You may lie in the bed or on a couch on your back, your stomach, your left side or your right side.  You may have one pillow under your head.  You may have pillows between your knees while you are on your side or under your knees while you are on your back.  2. DO NOT drive today.  Recline the seat as far back as it will go, while still wearing your seat belt, on the way home.  3. You may get up to go to the bathroom as needed.  You may sit up for 10 minutes to eat.  You may resume your normal diet and medications unless otherwise indicated.  4. The incidence of headache, nausea, or vomiting is about 5% (one in 20 patients).  If you develop a headache, lie flat and drink plenty of fluids until the headache goes away.  Caffeinated beverages may be helpful.  If you develop severe nausea and vomiting or a headache that does not go away with flat bed rest, call Dr Ellene Route  5. You may resume normal activities after your 24 hours of bed rest is over; however, do not exert yourself strongly or do any heavy lifting tomorrow.  6. Call your physician for a follow-up appointment.  The results of your myelogram will be sent directly to your physician by the following day.  7. If you have any questions or if complications develop after you arrive home, please call Dr Ellene Route.  Discharge instructions have been explained to the patient.  The patient, or the person responsible for the patient, fully understands these instructions.

## 2018-04-23 ENCOUNTER — Ambulatory Visit (INDEPENDENT_AMBULATORY_CARE_PROVIDER_SITE_OTHER): Payer: Medicare Other | Admitting: *Deleted

## 2018-04-23 DIAGNOSIS — I441 Atrioventricular block, second degree: Secondary | ICD-10-CM

## 2018-04-23 DIAGNOSIS — R001 Bradycardia, unspecified: Secondary | ICD-10-CM

## 2018-04-23 NOTE — Progress Notes (Signed)
Remote pacemaker transmission.   

## 2018-04-24 ENCOUNTER — Encounter (HOSPITAL_COMMUNITY): Payer: Self-pay

## 2018-04-24 LAB — CUP PACEART REMOTE DEVICE CHECK
Battery Remaining Longevity: 121 mo
Battery Remaining Percentage: 95.5 %
Battery Voltage: 3.01 V
Brady Statistic AP VP Percent: 59 %
Brady Statistic AP VS Percent: 1 %
Brady Statistic AS VP Percent: 40 %
Brady Statistic AS VS Percent: 1 %
Brady Statistic RA Percent Paced: 59 %
Brady Statistic RV Percent Paced: 99 %
Date Time Interrogation Session: 20190715150110
Implantable Lead Implant Date: 20170907
Implantable Lead Implant Date: 20170907
Implantable Lead Location: 753859
Implantable Lead Location: 753860
Implantable Pulse Generator Implant Date: 20170907
Lead Channel Impedance Value: 440 Ohm
Lead Channel Impedance Value: 680 Ohm
Lead Channel Pacing Threshold Amplitude: 0.5 V
Lead Channel Pacing Threshold Amplitude: 0.625 V
Lead Channel Pacing Threshold Pulse Width: 0.5 ms
Lead Channel Pacing Threshold Pulse Width: 0.5 ms
Lead Channel Sensing Intrinsic Amplitude: 12 mV
Lead Channel Sensing Intrinsic Amplitude: 4.2 mV
Lead Channel Setting Pacing Amplitude: 0.875
Lead Channel Setting Pacing Amplitude: 2 V
Lead Channel Setting Pacing Pulse Width: 0.5 ms
Lead Channel Setting Sensing Sensitivity: 2 mV
Pulse Gen Model: 2272
Pulse Gen Serial Number: 7945283

## 2018-04-25 ENCOUNTER — Encounter: Payer: Self-pay | Admitting: Cardiology

## 2018-04-25 ENCOUNTER — Other Ambulatory Visit: Payer: Self-pay | Admitting: Neurological Surgery

## 2018-04-25 DIAGNOSIS — M8580 Other specified disorders of bone density and structure, unspecified site: Secondary | ICD-10-CM

## 2018-04-26 ENCOUNTER — Encounter (HOSPITAL_COMMUNITY): Payer: Self-pay

## 2018-05-01 ENCOUNTER — Other Ambulatory Visit: Payer: Self-pay | Admitting: Cardiovascular Disease

## 2018-05-01 ENCOUNTER — Encounter (HOSPITAL_COMMUNITY): Payer: Self-pay

## 2018-05-03 ENCOUNTER — Telehealth: Payer: Self-pay

## 2018-05-03 ENCOUNTER — Encounter (HOSPITAL_COMMUNITY): Payer: Self-pay

## 2018-05-03 DIAGNOSIS — M8580 Other specified disorders of bone density and structure, unspecified site: Secondary | ICD-10-CM

## 2018-05-03 NOTE — Telephone Encounter (Signed)
Copied from [place] [id number]. Topic: General - Other >> May 03, 2018  3:53 PM Carolyn Stare wrote:  Pt call and ask if Dr Darnell Level will order her a dexa scan so that she can have it done at [facility] . Dr Ellene Route has ordered one but she can not go to [place] with his order

## 2018-05-03 NOTE — Telephone Encounter (Signed)
01/06/2015 is last bone density I see under imaging tab.Please advise.

## 2018-05-04 NOTE — Telephone Encounter (Signed)
ordered

## 2018-05-04 NOTE — Telephone Encounter (Signed)
Called patient and got her scheduled

## 2018-05-08 ENCOUNTER — Encounter (HOSPITAL_COMMUNITY): Payer: Self-pay

## 2018-05-09 ENCOUNTER — Ambulatory Visit (INDEPENDENT_AMBULATORY_CARE_PROVIDER_SITE_OTHER): Payer: Medicare Other | Admitting: Internal Medicine

## 2018-05-09 ENCOUNTER — Encounter: Payer: Self-pay | Admitting: Internal Medicine

## 2018-05-09 VITALS — BP 118/78 | HR 70 | Wt 166.0 lb

## 2018-05-09 DIAGNOSIS — S40811A Abrasion of right upper arm, initial encounter: Secondary | ICD-10-CM

## 2018-05-09 DIAGNOSIS — R0781 Pleurodynia: Secondary | ICD-10-CM | POA: Diagnosis not present

## 2018-05-09 DIAGNOSIS — M79601 Pain in right arm: Secondary | ICD-10-CM | POA: Diagnosis not present

## 2018-05-09 DIAGNOSIS — W010XXA Fall on same level from slipping, tripping and stumbling without subsequent striking against object, initial encounter: Secondary | ICD-10-CM | POA: Diagnosis not present

## 2018-05-09 MED ORDER — LIDOCAINE 5 % EX PTCH: 1.0000 | MEDICATED_PATCH | CUTANEOUS | 0 refills | Status: DC

## 2018-05-09 NOTE — Patient Instructions (Signed)
Rib Contusion A rib contusion is a deep bruise on your rib area. Contusions are the result of a blunt trauma that causes bleeding and injury to the tissues under the skin. A rib contusion may involve bruising of the ribs and of the skin and muscles in the area. The skin overlying the contusion may turn blue, purple, or yellow. Minor injuries will give you a painless contusion, but more severe contusions may stay painful and swollen for a few weeks. What are the causes? A contusion is usually caused by a blow, trauma, or direct force to an area of the body. This often occurs while playing contact sports. What are the signs or symptoms?  Swelling and redness of the injured area.  Discoloration of the injured area.  Tenderness and soreness of the injured area.  Pain with or without movement. How is this diagnosed? The diagnosis can be made by taking a medical history and performing a physical exam. An X-ray, CT scan, or MRI may be needed to determine if there were any associated injuries, such as broken bones (fractures) or internal injuries. How is this treated? Often, the best treatment for a rib contusion is rest. Icing or applying cold compresses to the injured area may help reduce swelling and inflammation. Deep breathing exercises may be recommended to reduce the risk of partial lung collapse and pneumonia. Over-the-counter or prescription medicines may also be recommended for pain control. Follow these instructions at home:  Apply ice to the injured area: ? Put ice in a plastic bag. ? Place a towel between your skin and the bag. ? Leave the ice on for 20 minutes, 2-3 times per day.  Take medicines only as directed by your health care provider.  Rest the injured area. Avoid strenuous activity and any activities or movements that cause pain. Be careful during activities and avoid bumping the injured area.  Perform deep-breathing exercises as directed by your health care provider.  Do  not lift anything that is heavier than 5 lb (2.3 kg) until your health care provider approves.  Do not use any tobacco products, including cigarettes, chewing tobacco, or electronic cigarettes. If you need help quitting, ask your health care provider. Contact a health care provider if:  You have increased bruising or swelling.  You have pain that is not controlled with treatment.  You have a fever. Get help right away if:  You have difficulty breathing or shortness of breath.  You develop a continual cough, or you cough up thick or bloody sputum.  You feel sick to your stomach (nauseous), you throw up (vomit), or you have abdominal pain. This information is not intended to replace advice given to you by your health care provider. Make sure you discuss any questions you have with your health care provider. Document Released: 06/21/2001 Document Revised: 03/03/2016 Document Reviewed: 07/08/2014 Elsevier Interactive Patient Education  2018 Elsevier Inc.  

## 2018-05-09 NOTE — Progress Notes (Signed)
Subjective:    Patient ID: Diane CROCKET, female    DOB: 01/29/1946, 72 y.o.   MRN: [id number]  HPI  Pt presents to the clinic today with c/o right side rib pain. She reports this started yesterday after a fall. She got up to use the restroom, tripped and landed on her right side. She has some bruising to her right arm and right side of her ribs. She reports the rib pain is worse with taking a deep breath. She has tried Tramadol with minimal relief. Bone density from 12/2014 showed osteopenia.  Review of Systems      Past Medical History:  Diagnosis Date  . Acute cystitis without hematuria 12/22/2015  . Anemia   . Arthritis   . CAP (community acquired pneumonia) 02/08/2016  . Cataracts, bilateral    immature  . Chronic diastolic heart failure ([place]) 09/19/2014  . Chronic insomnia   . Constipation    takes Miralax daily as needed  . COPD (chronic obstructive pulmonary disease) (HCC)    Albuterol inhaler daily as needed;Duoneb daily as needed;Spiriva daily  . DDD (degenerative disc disease)    cervical - kyphosis with mod DD changes C5/6 and C6/7; lumbar - early DD at L2/3 ([place])  . Essential hypertension    was on meds but after appointment Dec 11 with Dr.C he took her off  . GERD (gastroesophageal reflux disease)    takes Protonix daily  . History of pneumonia 02/16/2016  . History of radiation therapy 05/30/11 to 07/07/11   rectum  . History of shingles   . Hyperlipemia    takes Lovastatin daily  . Legally blind in left eye, as defined in [place]   . Lung nodule    RLL nodule-90mm stable 2006, April 2009, and June 2009  . Obesity   . Osteopenia 12/2014   T -1.5 hip  . Pancreatitis 11/2014   ?zpack related vs gallstone pancreatitis with abnormal HIDA scan pending cholecystectomy  . Rectal cancer ([place]) 04/2011   T3N0; s/p lap LAR, s/p ileostomy, s/p reversal, s/p chemo  . Research study patient 08/26/2016  . Rheumatic heart disease mitral stenosis    mod MS by echo 02/2014  .  S/P AVR (aortic valve replacement) 2015   bioprosthetic ([place])  . S/P MVR (mitral valve replacement) 2015   bioprosthetic ([place])  . Seizures ([place])    febrile seizures as a child  . Sepsis secondary to UTI ([place]) 08/22/2017  . Severe aortic valve stenosis  AVR 3/15 10/01/2013   mild-mod by echo 02/2014  . Sleep apnea    PT TOLD BORDERLINE-TRIED CPAP-DID NOT HELP-SHE DOES NOT USE CPAP  . Small bowel obstruction, partial ([place]) 08/2013   reolved without NGT placement.   . Vitamin B12 deficiency 02/28/2015   Start B12 shots 02/2015     Current Outpatient Medications  Medication Sig Dispense Refill  . acetaminophen (TYLENOL) 325 MG tablet Take 650 mg by mouth every 6 (six) hours as needed for mild pain or headache.     . albuterol (PROVENTIL HFA;VENTOLIN HFA) 108 (90 Base) MCG/ACT inhaler Inhale 2 puffs into the lungs every 6 (six) hours as needed for wheezing. 1 Inhaler 2  . amoxicillin (AMOXIL) 500 MG capsule take 4 capsules by mouth 30-60 MINUTES PRIOR TO DENTAL APPT (Patient taking differently: Take 2,000 mg by mouth See admin instructions. Take 2000 mg by mouth 30-60 MINUTES PRIOR TO DENTAL APPT) 4 capsule 12  . Biotin 10 MG CAPS Take 10 mg by  mouth daily.     . bisoprolol (ZEBETA) 5 MG tablet Take 1 tablet (5 mg total) by mouth daily. 30 tablet 3  . Calcium Carbonate-Vitamin D (CALCIUM 600 + D PO) Take 1 tablet by mouth daily.     . Cholecalciferol (VITAMIN D) 2000 UNITS CAPS Take 2,000 Units by mouth daily.     . fluticasone furoate-vilanterol (BREO ELLIPTA) 200-25 MCG/INH AEPB Inhale 1 puff into the lungs daily. 60 each 8  . furosemide (LASIX) 40 MG tablet TAKE 1 TABLET BY MOUTH EVERY DAY 5 TIMES A WEEK 20 tablet 11  . ipratropium-albuterol (DUONEB) 0.5-2.5 (3) MG/3ML SOLN Take 3 mLs by nebulization every 6 (six) hours as needed. (Patient taking differently: Take 3 mLs by nebulization every 6 (six) hours as needed (for shortness of breath or wheezing). ) 360 mL 0  . lovastatin  (MEVACOR) 40 MG tablet TAKE 1 TABLET BY MOUTH AT BEDTIME 30 tablet 4  . Miconazole Nitrate 2 % POWD Apply 1 application topically daily as needed (for skin). Apply to stomach and under breasts    . montelukast (SINGULAIR) 10 MG tablet TAKE 1 TABLET BY MOUTH AT BEDTIME 30 tablet 3  . Multiple Vitamins-Minerals (PRESERVISION AREDS 2 PO) Take 2 capsules by mouth daily.    . nitrofurantoin, macrocrystal-monohydrate, (MACROBID) 100 MG capsule Take 100 mg by mouth daily.    . polycarbophil (FIBERCON) 625 MG tablet Take 625 mg by mouth daily.    . polyethylene glycol (MIRALAX / GLYCOLAX) packet Take 17 g by mouth daily. 14 each 0  . Polyvinyl Alcohol-Povidone (REFRESH OP) Place 2 drops into both eyes daily as needed (for dry eyes).    . potassium chloride (K-DUR) 10 MEQ tablet TAKE 2 TABLETS BY MOUTH ONCE DAILY 60 tablet 0  . rivaroxaban (XARELTO) 20 MG TABS tablet Take 1 tablet (20 mg total) by mouth daily with supper. 30 tablet 6  . SPIRIVA HANDIHALER 18 MCG inhalation capsule INHALE THE CONTENTS OF ONE CAPSULE IN THE HANDIHALER ONCE DAILY 30 capsule 3  . traMADol (ULTRAM) 50 MG tablet Take 0.5-1 tablets (25-50 mg total) by mouth 2 (two) times daily as needed for moderate pain. 30 tablet 0  . vitamin B-12 (CYANOCOBALAMIN) 500 MCG tablet Take 500 mcg by mouth daily.     No current facility-administered medications for this visit.     Allergies  Allergen Reactions  . Sulfa Antibiotics Nausea Only  . Xarelto [Rivaroxaban] Itching and Other (See Comments)    Still taking Xarelto but has a minor itch  . Codeine Nausea Only  . Prednisone Hives and Other (See Comments)    In different places all over the body.  . Sulfonamide Derivatives Nausea Only    Family History  Problem Relation Age of Onset  . Lung cancer Father   . Heart disease Mother   . Diabetes Mother   . Hypertension Mother   . Stroke Mother   . CAD Son 26  . Heart attack Son 60  . Heart attack Maternal Grandfather   . Breast  cancer Maternal Aunt 83  . Colon cancer Neg Hx   . Esophageal cancer Neg Hx   . Stomach cancer Neg Hx   . Rectal cancer Neg Hx     Social History   Socioeconomic History  . Marital status: Widowed    Spouse name: Not on file  . Number of children: Not on file  . Years of education: Not on file  . Highest education level: Not on  file  Occupational History  . Occupation: retired  Scientific laboratory technician  . Financial resource strain: Not on file  . Food insecurity:    Worry: Not on file    Inability: Not on file  . Transportation needs:    Medical: Not on file    Non-medical: Not on file  Tobacco Use  . Smoking status: Former Smoker    Packs/day: 1.50    Years: 10.00    Pack years: 15.00    Types: Cigarettes    Last attempt to quit: 12/28/1974    Years since quitting: 43.3  . Smokeless tobacco: Never Used  . Tobacco comment: quit smoking in Jan 14, 1975  Substance and Sexual Activity  . Alcohol use: Yes    Alcohol/week: 1.2 oz    Types: 2 Glasses of wine per week  . Drug use: No  . Sexual activity: Never  Lifestyle  . Physical activity:    Days per week: Not on file    Minutes per session: Not on file  . Stress: Not on file  Relationships  . Social connections:    Talks on phone: Not on file    Gets together: Not on file    Attends religious service: Not on file    Active member of club or organization: Not on file    Attends meetings of clubs or organizations: Not on file    Relationship status: Not on file  . Intimate partner violence:    Fear of current or ex partner: Not on file    Emotionally abused: Not on file    Physically abused: Not on file    Forced sexual activity: Not on file  Other Topics Concern  . Not on file  Social History Narrative   Widow. Husband died Apr 16, 2015 MVA. 1 cat.    2 sons; 1 grandchild. One son died of MI 36   Married 01-13-65   Retired-[employer], volunteers at [employer]   Activity: pulm rehab   Diet: some water, fruits/vegetables daily      Constitutional: Denies fever, malaise, fatigue, headache or abrupt weight changes.  Respiratory: Denies difficulty breathing, shortness of breath, cough or sputum production.   Cardiovascular: Denies chest pain, chest tightness, palpitations or swelling in the hands or feet.  Gastrointestinal: Denies abdominal pain, bloating, constipation, diarrhea or blood in the stool.  Musculoskeletal: Pt reports right arm pain, right side rib pain. Denies decrease in range of motion, difficulty with gait, muscle pain or joint swelling.  Skin: Pt reports bruising of right arm, right side ribs. Denies redness, rashes, lesions or ulcercations.  Neurological: Pt reports difficulty with balance. Denies dizziness, difficulty with memory, difficulty with speech or problems with coordination.    No other specific complaints in a complete review of systems (except as listed in HPI above).  Objective:   Physical Exam   BP 118/78   Pulse 70   Wt 166 lb (75.3 kg)   SpO2 97%   BMI 26.79 kg/m  Wt Readings from Last 3 Encounters:  05/09/18 166 lb (75.3 kg)  04/19/18 171 lb (77.6 kg)  03/13/18 184 lb (83.5 kg)    General: Appears her stated age, in NAD. Skin: Hematoma noted of right upper arm with 2 cm abrasion. No bruising noted over the ribs. Pulmonary/Chest: Normal effort and positive vesicular breath sounds with intermittent expiratory wheezing. No respiratory distress. No rales or ronchi noted.  Abdomen: Soft and nontender. Normal bowel sounds.  Musculoskeletal: Normal external rotation of the shoulder. Pain with internal  rotation. Mild tenderness with palpation over the right subacromial bursa. Strength 5/5 BUE. Pain with palpation of the right lateral ribs, but no obvious deformity noted. Gait slow and steady without device. Neurological: Alert and oriented.    BMET    Component Value Date/Time   NA 140 02/05/2018 1030   NA 143 07/28/2014 0848   K 4.4 02/05/2018 1030   K 3.6 07/28/2014  0848   CL 101 02/05/2018 1030   CL 104 11/15/2012 0908   CO2 28 02/05/2018 1030   CO2 30 (H) 07/28/2014 0848   GLUCOSE 90 02/05/2018 1030   GLUCOSE 96 07/28/2014 0848   GLUCOSE 93 11/15/2012 0908   BUN 18 02/05/2018 1030   BUN 19.7 07/28/2014 0848   CREATININE 0.84 02/05/2018 1030   CREATININE 1.2 (H) 07/28/2014 0848   CALCIUM 9.9 02/05/2018 1030   CALCIUM 10.0 07/28/2014 0848   GFRNONAA >60 08/24/2017 0620   GFRAA >60 08/24/2017 0620    Lipid Panel     Component Value Date/Time   CHOL 147 11/15/2017 1001   TRIG 95.0 11/15/2017 1001   HDL 51.20 11/15/2017 1001   CHOLHDL 3 11/15/2017 1001   VLDL 19.0 11/15/2017 1001   LDLCALC 77 11/15/2017 1001    CBC    Component Value Date/Time   WBC 6.3 01/22/2018 1228   RBC 5.09 01/22/2018 1228   HGB 16.4 (H) 01/22/2018 1228   HGB 13.7 11/15/2012 0908   HCT 48.6 (H) 01/22/2018 1228   HCT 39.4 11/15/2012 0908   PLT 131.0 (L) 01/22/2018 1228   PLT 191 11/15/2012 0908   MCV 95.5 01/22/2018 1228   MCV 93.1 11/15/2012 0908   MCH 30.6 08/24/2017 0620   MCHC 33.8 01/22/2018 1228   RDW 14.8 01/22/2018 1228   RDW 14.4 11/15/2012 0908   LYMPHSABS 0.4 (L) 01/22/2018 1228   LYMPHSABS 0.9 11/15/2012 0908   MONOABS 0.7 01/22/2018 1228   MONOABS 0.5 11/15/2012 0908   EOSABS 0.1 01/22/2018 1228   EOSABS 0.2 11/15/2012 0908   BASOSABS 0.0 01/22/2018 1228   BASOSABS 0.1 11/15/2012 0908    Hgb A1C Lab Results  Component Value Date   HGBA1C 5.5 12/06/2013           Assessment & Plan:   Right Arm Pain, Right Side Rib Pain s/p Fall:  Low suspicion for right humerus fracture, she declines xray today as this is not bothering her as much as the ribs. Offered xray of right side of ribs, but discussed how this will not really change treatment. We decided to hold off on xray for now unless pain worsens Tylenol 650 mg every 8 hours eRx for Lidoderm patches (leave on for 12, take off for 12) Ice may be helpful  Return precautions  discussed Webb Silversmith, NP

## 2018-05-10 ENCOUNTER — Encounter (HOSPITAL_COMMUNITY): Payer: Self-pay

## 2018-05-10 ENCOUNTER — Telehealth: Payer: Self-pay | Admitting: Cardiology

## 2018-05-10 ENCOUNTER — Inpatient Hospital Stay: Admission: RE | Admit: 2018-05-10 | Payer: Medicare Other | Source: Ambulatory Visit

## 2018-05-10 ENCOUNTER — Telehealth: Payer: Self-pay

## 2018-05-10 ENCOUNTER — Other Ambulatory Visit: Payer: Medicare Other

## 2018-05-10 NOTE — Telephone Encounter (Signed)
Error message not needed

## 2018-05-10 NOTE — Telephone Encounter (Signed)
Pt states that she passed out on Tuesday. She went to her Primary Care Doctor and they advised her to get in touch with her Cardiologist to make sure everything is okay with her heart. I had her send a manual transmission and let her talk to the device tech nurse.

## 2018-05-10 NOTE — Telephone Encounter (Signed)
Transmission reviewed. 4 AMS episodes- only EGM is AF, 28s Vitali Seibert. No episodes from Tuesday 05/08/18 morning when she passed out. Normal device function. She has an appointment with Dr. Curt Bears 05/30/18 for 25mo follow up after finding AF and anticoagulation started. I will route the call to Trinidad Curet, RN and Dr. Curt Bears for their review and for any further recommendations.

## 2018-05-10 NOTE — Telephone Encounter (Signed)
Unknown cause for syncope. Would plan for continued monitoring via [facility].

## 2018-05-15 ENCOUNTER — Encounter (HOSPITAL_COMMUNITY): Payer: Self-pay

## 2018-05-16 ENCOUNTER — Telehealth: Payer: Self-pay

## 2018-05-16 NOTE — Telephone Encounter (Signed)
PA has been submitted via [email or web address] to [facility]

## 2018-05-17 ENCOUNTER — Encounter (HOSPITAL_COMMUNITY): Payer: Self-pay

## 2018-05-22 ENCOUNTER — Encounter (HOSPITAL_COMMUNITY): Payer: Self-pay

## 2018-05-24 ENCOUNTER — Encounter (HOSPITAL_COMMUNITY): Payer: Self-pay

## 2018-05-28 ENCOUNTER — Ambulatory Visit (INDEPENDENT_AMBULATORY_CARE_PROVIDER_SITE_OTHER): Payer: Medicare Other | Admitting: Family Medicine

## 2018-05-28 ENCOUNTER — Encounter: Payer: Self-pay | Admitting: Family Medicine

## 2018-05-28 VITALS — BP 120/80 | HR 61 | Temp 97.8°F | Ht 66.0 in | Wt 157.2 lb

## 2018-05-28 DIAGNOSIS — R55 Syncope and collapse: Secondary | ICD-10-CM

## 2018-05-28 DIAGNOSIS — Z6825 Body mass index (BMI) 25.0-25.9, adult: Secondary | ICD-10-CM

## 2018-05-28 DIAGNOSIS — J449 Chronic obstructive pulmonary disease, unspecified: Secondary | ICD-10-CM

## 2018-05-28 HISTORY — DX: Syncope and collapse: R55

## 2018-05-28 LAB — BASIC METABOLIC PANEL
BUN: 34 mg/dL — ABNORMAL HIGH (ref 6–23)
CO2: 30 mEq/L (ref 19–32)
Calcium: 10.4 mg/dL (ref 8.4–10.5)
Chloride: 101 mEq/L (ref 96–112)
Creatinine, Ser: 1.17 mg/dL (ref 0.40–1.20)
GFR: 48.2 mL/min — ABNORMAL LOW (ref >60.00)
Glucose, Bld: 97 mg/dL (ref 70–99)
Potassium: 4.7 mEq/L (ref 3.5–5.1)
Sodium: 138 mEq/L (ref 135–145)

## 2018-05-28 LAB — CBC WITH DIFFERENTIAL/PLATELET
Basophils Absolute: 0 10*3/uL (ref 0.0–0.1)
Basophils Relative: 0.5 % (ref 0.0–3.0)
Eosinophils Absolute: 0.1 10*3/uL (ref 0.0–0.7)
Eosinophils Relative: 1.2 % (ref 0.0–5.0)
HCT: 47.3 % — ABNORMAL HIGH (ref 36.0–46.0)
Hemoglobin: 16 g/dL — ABNORMAL HIGH (ref 12.0–15.0)
Lymphocytes Relative: 10.4 % — ABNORMAL LOW (ref 12.0–46.0)
Lymphs Abs: 0.7 10*3/uL (ref 0.7–4.0)
MCHC: 33.8 g/dL (ref 30.0–36.0)
MCV: 97.4 fl (ref 78.0–100.0)
Monocytes Absolute: 0.8 10*3/uL (ref 0.1–1.0)
Monocytes Relative: 11.7 % (ref 3.0–12.0)
Neutro Abs: 5.4 10*3/uL (ref 1.4–7.7)
Neutrophils Relative %: 76.2 % (ref 43.0–77.0)
Platelets: 132 10*3/uL — ABNORMAL LOW (ref 150.0–400.0)
RBC: 4.86 Mil/uL (ref 3.87–5.11)
RDW: 13.6 % (ref 11.5–15.5)
WBC: 7.1 10*3/uL (ref 4.0–10.5)

## 2018-05-28 LAB — TSH: TSH: 1.81 u[IU]/mL (ref 0.35–4.50)

## 2018-05-28 NOTE — Patient Instructions (Addendum)
If interested, check with pharmacy about new 2 shot shingles series (shingrix).  Labs today.  You had positive orthostatics on exam today - I recommend you cut bisoprolol in half until you see Dr Shonna Chock and touch base with him regarding best dose going forward.  Return to see me as needed or in 6 months for follow up visit.  Congratulations on weight loss to date!

## 2018-05-28 NOTE — Assessment & Plan Note (Addendum)
Recent episode 3 wks ago consistent with syncope, not mechanical fall. Check orthostatics today, positive - may be medication related. I did recommend she cut to 1/2 tab daily for next 2 days and she will touch base with Dr Shonna Chock regarding optimal bisoprolol dosing.  Check labs today (TSH, CBC, BMP)

## 2018-05-28 NOTE — Progress Notes (Signed)
BP 120/80 (BP Location: Left Arm, Patient Position: Sitting, Cuff Size: Normal)   Pulse 61   Temp 97.8 F (36.6 C) (Oral)   Ht 5\' 6"  (1.676 m)   Wt 157 lb 4 oz (71.3 kg)   SpO2 95%   BMI 25.38 kg/m   Orthostatic VS for the past 24 hrs (Last 3 readings):  BP- Lying BP- Standing at 0 minutes  05/28/18 0929 - 116/80  05/28/18 0928 144/82 -    CC: 6 mo f/u visit Subjective:    Patient ID: [name], female    DOB: 1945-12-31, 72 y.o.   MRN: [id number]  HPI: [name] is a 72 y.o. female presenting on 05/28/2018 for Follow-up (Here for 6 mo f/u.)   COPD followed by pulm on spiriva, breo, singulair with duonebs PRN - PFTs improving over time.   Weight management - lost total of 60 lbs since starting weight watchers early 2019!! Eats lots of chicken, salmon and tuna (these are zero points in [facility]).  Denies abd pain, blood in stool or urine, significant fatigue or malaise, pelvic pain or pressure or vaginal bleeding, nor night sweats.   3 wks ago she passed out - injured R ribcage and bad bruise to R arm. Evaluated by NP Rollene Fare, note reviewed. Actually today endorses LOC - she did not trip over anything. Endorses ongoing episodes of orthostatic lightheadedness.  Attributes dizzy spells to blood thinners. She also recently had bisoprolol increased to 5mg  daily. She also takes lasix 40mg  daily 5d/wk. On blood thinners due to Afib by pacemaker report.  Has EP f/u in 2 days.   Sees urology - on toviaz and macrobid 100mg  daily.  Relevant past medical, surgical, family and social history reviewed and updated as indicated. Interim medical history since our last visit reviewed. Allergies and medications reviewed and updated. Outpatient Medications Prior to Visit  Medication Sig Dispense Refill  . acetaminophen (TYLENOL) 325 MG tablet Take 650 mg by mouth every 6 (six) hours as needed for mild pain or headache.     . albuterol (PROVENTIL HFA;VENTOLIN HFA) 108 (90 Base)  MCG/ACT inhaler Inhale 2 puffs into the lungs every 6 (six) hours as needed for wheezing. 1 Inhaler 2  . amoxicillin (AMOXIL) 500 MG capsule take 4 capsules by mouth 30-60 MINUTES PRIOR TO DENTAL APPT (Patient taking differently: Take 2,000 mg by mouth See admin instructions. Take 2000 mg by mouth 30-60 MINUTES PRIOR TO DENTAL APPT) 4 capsule 12  . Biotin 10 MG CAPS Take 10 mg by mouth daily.     . bisoprolol (ZEBETA) 5 MG tablet Take 1 tablet (5 mg total) by mouth daily. 30 tablet 3  . Calcium Carbonate-Vitamin D (CALCIUM 600 + D PO) Take 1 tablet by mouth daily.     . Cholecalciferol (VITAMIN D) 2000 UNITS CAPS Take 2,000 Units by mouth daily.     . fluticasone furoate-vilanterol (BREO ELLIPTA) 200-25 MCG/INH AEPB Inhale 1 puff into the lungs daily. 60 each 8  . furosemide (LASIX) 40 MG tablet TAKE 1 TABLET BY MOUTH EVERY DAY 5 TIMES A WEEK 20 tablet 11  . ipratropium-albuterol (DUONEB) 0.5-2.5 (3) MG/3ML SOLN Take 3 mLs by nebulization every 6 (six) hours as needed. (Patient taking differently: Take 3 mLs by nebulization every 6 (six) hours as needed (for shortness of breath or wheezing). ) 360 mL 0  . lovastatin (MEVACOR) 40 MG tablet TAKE 1 TABLET BY MOUTH AT BEDTIME 30 tablet 4  . Miconazole  Nitrate 2 % POWD Apply 1 application topically daily as needed (for skin). Apply to stomach and under breasts    . montelukast (SINGULAIR) 10 MG tablet TAKE 1 TABLET BY MOUTH AT BEDTIME 30 tablet 3  . Multiple Vitamins-Minerals (PRESERVISION AREDS 2 PO) Take 2 capsules by mouth daily.    . nitrofurantoin, macrocrystal-monohydrate, (MACROBID) 100 MG capsule Take 100 mg by mouth daily.    . polycarbophil (FIBERCON) 625 MG tablet Take 625 mg by mouth daily.    . polyethylene glycol (MIRALAX / GLYCOLAX) packet Take 17 g by mouth daily. 14 each 0  . Polyvinyl Alcohol-Povidone (REFRESH OP) Place 2 drops into both eyes daily as needed (for dry eyes).    . potassium chloride (K-DUR) 10 MEQ tablet TAKE 2 TABLETS BY  MOUTH ONCE DAILY 60 tablet 0  . rivaroxaban (XARELTO) 20 MG TABS tablet Take 1 tablet (20 mg total) by mouth daily with supper. 30 tablet 6  . SPIRIVA HANDIHALER 18 MCG inhalation capsule INHALE THE CONTENTS OF ONE CAPSULE IN THE HANDIHALER ONCE DAILY 30 capsule 3  . traMADol (ULTRAM) 50 MG tablet Take 0.5-1 tablets (25-50 mg total) by mouth 2 (two) times daily as needed for moderate pain. 30 tablet 0  . vitamin B-12 (CYANOCOBALAMIN) 500 MCG tablet Take 500 mcg by mouth daily.    . TOVIAZ 8 MG TB24 tablet Take 1 tablet by mouth daily.  11  . lidocaine (LIDODERM) 5 % Place 1 patch onto the skin daily. Remove & Discard patch within 12 hours or as directed by MD 30 patch 0   No facility-administered medications prior to visit.      Per HPI unless specifically indicated in ROS section below Review of Systems     Objective:    BP 120/80 (BP Location: Left Arm, Patient Position: Sitting, Cuff Size: Normal)   Pulse 61   Temp 97.8 F (36.6 C) (Oral)   Ht 5\' 6"  (1.676 m)   Wt 157 lb 4 oz (71.3 kg)   SpO2 95%   BMI 25.38 kg/m   Wt Readings from Last 3 Encounters:  05/28/18 157 lb 4 oz (71.3 kg)  05/09/18 166 lb (75.3 kg)  04/19/18 171 lb (77.6 kg)    Physical Exam  Constitutional: She appears well-developed and well-nourished. No distress.  HENT:  Mouth/Throat: Oropharynx is clear and moist. No oropharyngeal exudate.  Neck: Carotid bruit is not present.  Cardiovascular: Normal rate and regular rhythm.  Murmur (2/6 systolic) heard. Pulmonary/Chest: Effort normal and breath sounds normal. No respiratory distress. She has no wheezes. She has no rales.  Tender along right lateral ribcage  Abdominal: Soft. Bowel sounds are normal. She exhibits no distension. There is no tenderness. There is no rebound and no guarding. No hernia.  Musculoskeletal: She exhibits no edema.  Skin:  Bruising R upper extremity without bone pain to palpation of humerus  Psychiatric: She has a normal mood and  affect.  Nursing note and vitals reviewed.   Lab Results  Component Value Date   HGBA1C 5.5 12/06/2013    Lab Results  Component Value Date   WBC 6.3 01/22/2018   HGB 16.4 (H) 01/22/2018   HCT 48.6 (H) 01/22/2018   MCV 95.5 01/22/2018   PLT 131.0 (L) 01/22/2018    Lab Results  Component Value Date   CREATININE 0.84 02/05/2018   BUN 18 02/05/2018   NA 140 02/05/2018   K 4.4 02/05/2018   CL 101 02/05/2018   CO2 28 02/05/2018  Assessment & Plan:   Problem List Items Addressed This Visit    Syncope - Primary    Recent episode 3 wks ago consistent with syncope, not mechanical fall. Check orthostatics today, positive - may be medication related. I did recommend she cut to 1/2 tab daily for next 2 days and she will touch base with Dr Shonna Chock regarding optimal bisoprolol dosing.  Check labs today (TSH, CBC, BMP)      Relevant Orders   CBC with Differential/Platelet   TSH   Basic metabolic panel   COPD (chronic obstructive pulmonary disease) (HCC)    Stable period, appreciate pulm assistance.      BMI 25.0-25.9,adult    Congratulated on weight loss to date. Don't recommend much more weight loss. She states her goal is 154lbs.           No orders of the defined types were placed in this encounter.  Orders Placed This Encounter  Procedures  . CBC with Differential/Platelet  . TSH  . Basic metabolic panel    Follow up plan: Return in about 6 months (around 11/28/2018), or if symptoms worsen or fail to improve, for annual exam, prior fasting for blood work, medicare wellness visit.  Ria Bush, MD

## 2018-05-28 NOTE — Assessment & Plan Note (Signed)
Congratulated on weight loss to date. Don't recommend much more weight loss. She states her goal is 154lbs.

## 2018-05-28 NOTE — Assessment & Plan Note (Signed)
Stable period, appreciate pulm assistance.

## 2018-05-29 ENCOUNTER — Encounter (HOSPITAL_COMMUNITY): Payer: Self-pay

## 2018-05-30 ENCOUNTER — Encounter: Payer: Self-pay | Admitting: Family Medicine

## 2018-05-30 ENCOUNTER — Encounter: Payer: Self-pay | Admitting: Cardiology

## 2018-05-30 ENCOUNTER — Ambulatory Visit (INDEPENDENT_AMBULATORY_CARE_PROVIDER_SITE_OTHER): Payer: Medicare Other | Admitting: Cardiology

## 2018-05-30 VITALS — BP 112/70 | HR 60 | Ht 66.0 in | Wt 158.0 lb

## 2018-05-30 DIAGNOSIS — E785 Hyperlipidemia, unspecified: Secondary | ICD-10-CM

## 2018-05-30 DIAGNOSIS — I480 Paroxysmal atrial fibrillation: Secondary | ICD-10-CM | POA: Diagnosis not present

## 2018-05-30 DIAGNOSIS — I441 Atrioventricular block, second degree: Secondary | ICD-10-CM | POA: Diagnosis not present

## 2018-05-30 DIAGNOSIS — I5032 Chronic diastolic (congestive) heart failure: Secondary | ICD-10-CM | POA: Diagnosis not present

## 2018-05-30 DIAGNOSIS — I080 Rheumatic disorders of both mitral and aortic valves: Secondary | ICD-10-CM

## 2018-05-30 MED ORDER — METOPROLOL TARTRATE 25 MG PO TABS: 12.5000 mg | ORAL_TABLET | Freq: Every day | ORAL | 3 refills | Status: DC

## 2018-05-30 MED ORDER — METOPROLOL TARTRATE 25 MG PO TABS: 12.5000 mg | ORAL_TABLET | Freq: Two times a day (BID) | ORAL | 3 refills | Status: DC

## 2018-05-30 NOTE — Telephone Encounter (Signed)
New Message:   Patient calling about Metoprolol. The pharmacy told patient that this medication come in that she doesn't have to cute in half. [phone]

## 2018-05-30 NOTE — Patient Instructions (Signed)
Medication Instructions:  Your physician has recommended you make the following change in your medication:  1. STOP Zebeta 2. START Metoprolol tartrate (Lopressor) 12.5 mg twice daily  * If you need a refill on your cardiac medications before your next appointment, please call your pharmacy.   Labwork: None ordered *We will only notify you of abnormal results, otherwise continue current treatment plan.  Testing/Procedures: None ordered  Follow-Up: Your physician wants you to follow-up in: 6 months with Dr. Curt Bears. You will receive a reminder letter in the mail two months in advance. If you don't receive a letter, please call our office to schedule the follow-up appointment.  *Please note that any paperwork needing to be filled out by the provider will need to be addressed at the front desk prior to seeing the provider. Please note that any FMLA, disability or other documents regarding health condition is subject to a $25.00 charge that must be received prior to completion of paperwork in the form of a money order or check.  Thank you for choosing [facility]!!   Trinidad Curet, RN [phone]  Any Other Special Instructions Will Be Listed Below (If Applicable).

## 2018-05-30 NOTE — Progress Notes (Signed)
Electrophysiology Office Note   Date:  05/30/2018   ID:  [name] 14-Mar-1946, MRN [id number]  PCP:  Ria Bush, MD  Cardiologist:  [place] Primary Electrophysiologist:  Mehek Grega Meredith Leeds, MD    No chief complaint on file.    History of Present Illness: [name] is a 72 y.o. female who presents today for electrophysiology evaluation.   She presented to the hospital in September 2017 with progressive exercise intolerance and shortness of breath and was found to be in 2:1 AV block. She has St. Jude dual-chamber pacemaker implanted at that time.   Today, denies symptoms of palpitations, chest pain, shortness of breath, orthopnea, PND, lower extremity edema, claudication, dizziness, presyncope, syncope, bleeding, or neurologic sequela. The patient is tolerating medications without difficulties.  Overall she is feeling well.  She did have an episode of syncope early in August.  Her primary physician saw her and felt that she was orthostatic.  Her bisoprolol dose was decreased to 2.5 mg daily.  She is currently having trouble cutting the 5 mg tabs.  She has had no further episodes of syncope since her bisoprolol was decreased.   Past Medical History:  Diagnosis Date  . Acute cystitis without hematuria 12/22/2015  . Anemia   . Arthritis   . CAP (community acquired pneumonia) 02/08/2016  . Cataracts, bilateral    immature  . Chronic diastolic heart failure ([place]) 09/19/2014  . Chronic insomnia   . Constipation    takes Miralax daily as needed  . COPD (chronic obstructive pulmonary disease) (HCC)    Albuterol inhaler daily as needed;Duoneb daily as needed;Spiriva daily  . DDD (degenerative disc disease)    cervical - kyphosis with mod DD changes C5/6 and C6/7; lumbar - early DD at L2/3 ([place])  . Essential hypertension    was on meds but after appointment Dec 11 with Dr.C he took her off  . GERD (gastroesophageal reflux disease)    takes Protonix daily  .  History of pneumonia 02/16/2016  . History of radiation therapy 05/30/11 to 07/07/11   rectum  . History of shingles   . Hyperlipemia    takes Lovastatin daily  . Legally blind in left eye, as defined in [place]   . Lung nodule    RLL nodule-25mm stable 2006, April 2009, and June 2009  . Obesity   . Osteopenia 12/2014   T -1.5 hip  . Pancreatitis 11/2014   ?zpack related vs gallstone pancreatitis with abnormal HIDA scan pending cholecystectomy  . Rectal cancer ([place]) 04/2011   T3N0; s/p lap LAR, s/p ileostomy, s/p reversal, s/p chemo  . Research study patient 08/26/2016  . Rheumatic heart disease mitral stenosis    mod MS by echo 02/2014  . S/P AVR (aortic valve replacement) 2015   bioprosthetic ([place])  . S/P MVR (mitral valve replacement) 2015   bioprosthetic ([place])  . Seizures ([place])    febrile seizures as a child  . Sepsis secondary to UTI ([place]) 08/22/2017  . Severe aortic valve stenosis  AVR 3/15 10/01/2013   mild-mod by echo 02/2014  . Sleep apnea    PT TOLD BORDERLINE-TRIED CPAP-DID NOT HELP-SHE DOES NOT USE CPAP  . Small bowel obstruction, partial ([place]) 08/2013   reolved without NGT placement.   . Vitamin B12 deficiency 02/28/2015   Start B12 shots 02/2015    Past Surgical History:  Procedure Laterality Date  . AORTIC VALVE REPLACEMENT N/A 12/12/2013   Procedure: AORTIC VALVE REPLACEMENT (AVR);  Surgeon: Gaye Pollack, MD; Service: Open Heart Surgery  . BACK SURGERY  2006, 2007   2006 SPACER, 2007 decompression and fusion L4/5  . BOWEL RESECTION  04/02/2012   Procedure: SMALL BOWEL RESECTION;  Surgeon: Stark Klein, MD;  Location: [facility];  Service: General;  Laterality: N/A;  . CARPAL TUNNEL RELEASE Bilateral   . CATARACT EXTRACTION W/ INTRAOCULAR LENS IMPLANT Left 10/18/2016  . CATARACT EXTRACTION W/ INTRAOCULAR LENS IMPLANT Right 12/13/2016   Dr. Kathrin Penner  . CHOLECYSTECTOMY N/A 01/21/2015   chronic cholecystitis, Stark Klein, MD  . COLON RESECTION  08/25/2011    Procedure: COLON RESECTION LAPAROSCOPIC;  Surgeon: Stark Klein, MD;  Location: [facility];  Service: General;  Laterality: N/A;  Laparoscopic Assisted Low Anterior Resection Diverting Ostomy and onQ pain pump  . COLONOSCOPY  03/2013   1 polyp, rpt 3 yrs Ardis Hughs)  . COLONOSCOPY WITH PROPOFOL N/A 06/09/2016   patent colo-colonic anastomosis, rpt 5 yrs Ardis Hughs)  . EP IMPLANTABLE DEVICE N/A 06/16/2016   Procedure: Pacemaker Implant;  Surgeon: Sherrice Creekmore Meredith Leeds, MD;  Location: [place] [facility];  Service: Cardiovascular;  Laterality: N/A;  . FOOT SURGERY  left foot   hammer toe  . HAND SURGERY  Bil   carpal tunnel  . ILEOSTOMY  08/25/2011  . ILEOSTOMY CLOSURE  04/02/2012   Procedure: ILEOSTOMY TAKEDOWN;  Surgeon: Stark Klein, MD;  Location: [facility];  Service: General;  Laterality: N/A;  . INTRAOPERATIVE TRANSESOPHAGEAL ECHOCARDIOGRAM N/A 12/12/2013   Procedure: INTRAOPERATIVE TRANSESOPHAGEAL ECHOCARDIOGRAM;  Surgeon: Gaye Pollack, MD;  Location: [place] OR;  Service: Open Heart Surgery;  Laterality: N/A;  . LEFT AND RIGHT HEART CATHETERIZATION WITH CORONARY ANGIOGRAM N/A 11/27/2013   Procedure: LEFT AND RIGHT HEART CATHETERIZATION WITH CORONARY ANGIOGRAM;  Surgeon: Sanda Klein, MD;  Location: [place] [facility];  Service: Cardiovascular;  Laterality: N/A;  . MITRAL VALVE REPLACEMENT N/A 12/12/2013   Procedure: MITRAL VALVE (MV) REPLACEMENT OR REPAIR;  Surgeon: Gaye Pollack, MD; Service: Open Heart Surgery  . TEE WITHOUT CARDIOVERSION N/A 11/29/2013   Procedure: TRANSESOPHAGEAL ECHOCARDIOGRAM (TEE);  Surgeon: Sueanne Margarita, MD;  Location: [facility] ENDOSCOPY;  Service: Cardiovascular;  Laterality: N/A;  . TONSILLECTOMY       Current Outpatient Medications  Medication Sig Dispense Refill  . acetaminophen (TYLENOL) 325 MG tablet Take 650 mg by mouth every 6 (six) hours as needed for mild pain or headache.     . albuterol (PROVENTIL HFA;VENTOLIN HFA) 108 (90 Base) MCG/ACT inhaler Inhale 2 puffs into the lungs every 6  (six) hours as needed for wheezing. 1 Inhaler 2  . amoxicillin (AMOXIL) 500 MG capsule take 4 capsules by mouth 30-60 MINUTES PRIOR TO DENTAL APPT 4 capsule 12  . Biotin 10 MG CAPS Take 10 mg by mouth daily.     . bisoprolol (ZEBETA) 5 MG tablet Take 2.5 mg by mouth daily.    . Calcium Carbonate-Vitamin D (CALCIUM 600 + D PO) Take 1 tablet by mouth daily.     . Cholecalciferol (VITAMIN D) 2000 UNITS CAPS Take 2,000 Units by mouth daily.     . fluticasone furoate-vilanterol (BREO ELLIPTA) 200-25 MCG/INH AEPB Inhale 1 puff into the lungs daily. 60 each 8  . furosemide (LASIX) 40 MG tablet TAKE 1 TABLET BY MOUTH EVERY DAY 5 TIMES A WEEK 20 tablet 11  . ipratropium-albuterol (DUONEB) 0.5-2.5 (3) MG/3ML SOLN Take 3 mLs by nebulization every 6 (six) hours as needed. 360 mL 0  . lovastatin (MEVACOR) 40 MG tablet TAKE  1 TABLET BY MOUTH AT BEDTIME 30 tablet 4  . Miconazole Nitrate 2 % POWD Apply 1 application topically daily as needed (for skin). Apply to stomach and under breasts    . montelukast (SINGULAIR) 10 MG tablet TAKE 1 TABLET BY MOUTH AT BEDTIME 30 tablet 3  . Multiple Vitamins-Minerals (PRESERVISION AREDS 2 PO) Take 2 capsules by mouth daily.    . nitrofurantoin, macrocrystal-monohydrate, (MACROBID) 100 MG capsule Take 100 mg by mouth daily.    . polycarbophil (FIBERCON) 625 MG tablet Take 625 mg by mouth daily.    . polyethylene glycol (MIRALAX / GLYCOLAX) packet Take 17 g by mouth daily. 14 each 0  . Polyvinyl Alcohol-Povidone (REFRESH OP) Place 2 drops into both eyes daily as needed (for dry eyes).    . potassium chloride (K-DUR) 10 MEQ tablet TAKE 2 TABLETS BY MOUTH ONCE DAILY 60 tablet 0  . rivaroxaban (XARELTO) 20 MG TABS tablet Take 1 tablet (20 mg total) by mouth daily with supper. 30 tablet 6  . SPIRIVA HANDIHALER 18 MCG inhalation capsule INHALE THE CONTENTS OF ONE CAPSULE IN THE HANDIHALER ONCE DAILY 30 capsule 3  . TOVIAZ 8 MG TB24 tablet Take 1 tablet by mouth daily.  11  .  traMADol (ULTRAM) 50 MG tablet Take 0.5-1 tablets (25-50 mg total) by mouth 2 (two) times daily as needed for moderate pain. 30 tablet 0  . vitamin B-12 (CYANOCOBALAMIN) 500 MCG tablet Take 500 mcg by mouth daily.     No current facility-administered medications for this visit.     Allergies:   Sulfa antibiotics; Xarelto [rivaroxaban]; Codeine; Prednisone; and Sulfonamide derivatives   Social History:  The patient  reports that she quit smoking about 43 years ago. Her smoking use included cigarettes. She has a 15.00 pack-year smoking history. She has never used smokeless tobacco. She reports that she drinks about 2.0 standard drinks of alcohol per week. She reports that she does not use drugs.   Family History:  The patient's family history includes Breast cancer (age of onset: 47) in her maternal aunt; CAD (age of onset: 10) in her son; Diabetes in her mother; Heart attack in her maternal grandfather; Heart attack (age of onset: 45) in her son; Heart disease in her mother; Hypertension in her mother; Lung cancer in her father; Stroke in her mother.    ROS:  Please see the history of present illness.   Otherwise, review of systems is positive for pain, dizziness, passing.   All other systems are reviewed and negative.   PHYSICAL EXAM: VS:  BP 112/70   Pulse 60   Ht 5\' 6"  (1.676 m)   Wt 158 lb (71.7 kg)   SpO2 96%   BMI 25.50 kg/m  , BMI Body mass index is 25.5 kg/m. GEN: Well nourished, well developed, in no acute distress  HEENT: normal  Neck: no JVD, carotid bruits, or masses Cardiac: RRR; no murmurs, rubs, or gallops,no edema  Respiratory:  clear to auscultation bilaterally, normal work of breathing GI: soft, nontender, nondistended, + BS MS: no deformity or atrophy  Skin: warm and dry, device site well healed Neuro:  Strength and sensation are intact Psych: euthymic mood, full affect  EKG:  EKG is ordered today. Personal review of the ekg ordered shows AV paced, rate  60  Personal review of the device interrogation today. Results in [facility]: 02/05/2018: ALT 64 05/28/2018: BUN 34; Creatinine, Ser 1.17; Hemoglobin 16.0; Platelets 132.0; Potassium 4.7; Sodium  138; TSH 1.81    Lipid Panel     Component Value Date/Time   CHOL 147 11/15/2017 1001   TRIG 95.0 11/15/2017 1001   HDL 51.20 11/15/2017 1001   CHOLHDL 3 11/15/2017 1001   VLDL 19.0 11/15/2017 1001   LDLCALC 77 11/15/2017 1001   LDLDIRECT 111.0 06/26/2015 0928     Wt Readings from Last 3 Encounters:  05/30/18 158 lb (71.7 kg)  05/28/18 157 lb 4 oz (71.3 kg)  05/09/18 166 lb (75.3 kg)      Other studies Reviewed: Additional studies/ records that were reviewed today include: TTE 06/29/16  Review of the above records today demonstrates:  - Left ventricle: The cavity size was normal. Wall thickness was   increased in a pattern of mild LVH. Systolic function was normal.   The estimated ejection fraction was in the range of 60% to 65%.   Indeterminant diastolic function. Wall motion was normal; there   were no regional wall motion abnormalities. - Aortic valve: There was a bioprosthetic aortic valve. There did   not appear to be significant regurgitation or stenosis. Mean   gradient (S): 17 mm Hg. - Mitral valve: There was a bioprosthetic mitral valve. There did   not appear to be significant stenosis or regurgitation. The   residual native mitral apparatus is severely calcified. Mean   gradient (D): 6 mm Hg. Valve area by pressure half-time: 1.8   cm^2. - Left atrium: The atrium was mildly dilated. - Right ventricle: The cavity size was normal. Systolic function   was normal. - Tricuspid valve: Peak RV-RA gradient (S): 23 mm Hg. - Pulmonary arteries: PA peak pressure: 26 mm Hg (S). - Inferior vena cava: The vessel was normal in size. The   respirophasic diameter changes were in the normal range (= 50%),   consistent with normal central venous pressure.  Myoview  02/26/18  Nuclear stress EF: 57%.  The study is normal.  This is a low risk study.  The left ventricular ejection fraction is normal (55-65%).   Normal pharmacologic nuclear stress test with no evidence for prior infarct or ischemia. Normal LVEF.   ASSESSMENT AND PLAN:  1.  2:1 AV block: Status post Saint Jude dual-chamber pacemaker.  Device functioning appropriately.  No changes.  2. Diastolic heart failure: Currently on bisoprolol and Lasix.  No volume overload signs.  3. Rheumatic heart disease: Post mitral and aortic valve replacement.  Echo scheduled for March 2020  4. Hyperlipidemia: Lovastatin  5. Paroxysmal atrial fibrillation: Had an episode of syncope on higher dose of bisoprolol and was orthostatic at the time.  She is not having rapid rates during atrial fibrillation.  [name] potentially switch to metoprolol and equivalent dose.  Continue Xarelto.  This patients CHA2DS2-VASc Score and unadjusted Ischemic Stroke Rate (% per year) is equal to 2.2 % stroke rate/year from a score of 2  Above score calculated as 1 point each if present [CHF, HTN, DM, Vascular=MI/PAD/Aortic Plaque, Age if 65-74, or Female] Above score calculated as 2 points each if present [Age > 75, or Stroke/TIA/TE]  6.  Syncope: Likely due to higher doses of bisoprolol.  [name] reduce dose back down to her previous value.  Current medicines are reviewed at length with the patient today.   The patient does not have concerns regarding her medicines.  The following changes were made today: Stop bisoprolol start 12.5 mg metoprolol  Labs/ tests ordered today include:  Orders Placed This Encounter  Procedures  .  EKG 12-Lead     Disposition:   FU with Kayla Weekes 6 months  Signed, Irwin Toran Meredith Leeds, MD  05/30/2018 9:44 AM     [facility] [address] [place] [address] [phone] (office) [phone] (fax)

## 2018-05-31 ENCOUNTER — Telehealth: Payer: Self-pay

## 2018-05-31 ENCOUNTER — Encounter (HOSPITAL_COMMUNITY): Payer: Self-pay

## 2018-05-31 LAB — CUP PACEART INCLINIC DEVICE CHECK
Battery Remaining Longevity: 126 mo
Battery Voltage: 3.01 V
Brady Statistic RA Percent Paced: 59 %
Brady Statistic RV Percent Paced: 99.04 %
Date Time Interrogation Session: 20190821132945
Implantable Lead Implant Date: 20170907
Implantable Lead Implant Date: 20170907
Implantable Lead Location: 753859
Implantable Lead Location: 753860
Implantable Pulse Generator Implant Date: 20170907
Lead Channel Impedance Value: 450 Ohm
Lead Channel Impedance Value: 725 Ohm
Lead Channel Pacing Threshold Amplitude: 0.5 V
Lead Channel Pacing Threshold Amplitude: 0.625 V
Lead Channel Pacing Threshold Pulse Width: 0.5 ms
Lead Channel Pacing Threshold Pulse Width: 0.5 ms
Lead Channel Sensing Intrinsic Amplitude: 10.6 mV
Lead Channel Sensing Intrinsic Amplitude: 4.2 mV
Lead Channel Setting Pacing Amplitude: 0.875
Lead Channel Setting Pacing Amplitude: 1.5 V
Lead Channel Setting Pacing Pulse Width: 0.5 ms
Lead Channel Setting Sensing Sensitivity: 2 mV
Pulse Gen Model: 2272
Pulse Gen Serial Number: 7945283

## 2018-05-31 NOTE — Telephone Encounter (Signed)
Pt called to report that she did not realize that her new RX for Metoprolol was also a med she would have to cut in half. She was wondering if it would be easier to cut than the Bisoprolol. I advised her to take her current pills to the pharmacy and see if they can cut them for her. If they still tend to fall apart to try the metoprolol while at the pharmacy. She says she will go there this morning and will let us know later today which med she decides to stay on so we can be sure that her med list is updated. Pt agrees and will let us know.

## 2018-06-01 ENCOUNTER — Other Ambulatory Visit: Payer: Self-pay

## 2018-06-01 ENCOUNTER — Telehealth: Payer: Self-pay

## 2018-06-01 NOTE — Telephone Encounter (Signed)
Pt reports that she did not realize that her metoprolol was also a med she would have to cut in half.. Per my advice pt asked the pharmacy to help her cut her pills but they declined and said that it is not something they can do. Under the circumstance, she is requesting to stay on the Bisoprolol 2.5mg  since she was doing well on it originally and will work on getting a better pill cutter. I advised her that I will let Dr. Curt Bears know to be sure that he is okay with all of this and will let her know.. But to continue taking her Bisoprolol for now. Pt agrees and will wait for my call back.

## 2018-06-01 NOTE — Telephone Encounter (Signed)
Released via mychart

## 2018-06-01 NOTE — Telephone Encounter (Signed)
See telephone note from today, 8/23, for further documentation on this.

## 2018-06-01 NOTE — Telephone Encounter (Signed)
Left detailed message advising that Dr. Curt Bears is agreeable to her staying on Bisoprolol. Asked pt to call me next week to confirm her decision.

## 2018-06-04 MED ORDER — BISOPROLOL FUMARATE 5 MG PO TABS: 2.5000 mg | ORAL_TABLET | Freq: Every day | ORAL | 1 refills | Status: DC

## 2018-06-04 NOTE — Telephone Encounter (Signed)
Follow Up ° °Pt returning call for nurse °

## 2018-06-04 NOTE — Telephone Encounter (Signed)
Pt going to remain on Bisoprolol Rx sent to pharmacy per pt request.

## 2018-06-05 ENCOUNTER — Encounter (HOSPITAL_COMMUNITY): Payer: Self-pay

## 2018-06-07 ENCOUNTER — Other Ambulatory Visit: Payer: Self-pay | Admitting: Internal Medicine

## 2018-06-07 ENCOUNTER — Encounter (HOSPITAL_COMMUNITY): Payer: Self-pay

## 2018-06-09 ENCOUNTER — Other Ambulatory Visit: Payer: Self-pay | Admitting: Cardiovascular Disease

## 2018-06-12 ENCOUNTER — Ambulatory Visit (INDEPENDENT_AMBULATORY_CARE_PROVIDER_SITE_OTHER)
Admission: RE | Admit: 2018-06-12 | Discharge: 2018-06-12 | Disposition: A | Discharge status: Home / Self Care | Payer: Medicare Other | Source: Ambulatory Visit | Attending: Family Medicine | Admitting: Family Medicine

## 2018-06-12 ENCOUNTER — Encounter (HOSPITAL_COMMUNITY): Payer: Self-pay

## 2018-06-12 DIAGNOSIS — M85852 Other specified disorders of bone density and structure, left thigh: Secondary | ICD-10-CM | POA: Diagnosis not present

## 2018-06-12 DIAGNOSIS — M8580 Other specified disorders of bone density and structure, unspecified site: Secondary | ICD-10-CM

## 2018-06-13 ENCOUNTER — Ambulatory Visit (INDEPENDENT_AMBULATORY_CARE_PROVIDER_SITE_OTHER)
Admission: RE | Admit: 2018-06-13 | Discharge: 2018-06-13 | Disposition: A | Discharge status: Home / Self Care | Payer: Medicare Other | Source: Ambulatory Visit | Attending: Internal Medicine | Admitting: Internal Medicine

## 2018-06-13 DIAGNOSIS — R918 Other nonspecific abnormal finding of lung field: Secondary | ICD-10-CM | POA: Diagnosis not present

## 2018-06-14 ENCOUNTER — Encounter (HOSPITAL_COMMUNITY): Payer: Self-pay

## 2018-06-14 ENCOUNTER — Telehealth: Payer: Self-pay | Admitting: Cardiovascular Disease

## 2018-06-14 NOTE — Telephone Encounter (Signed)
Spoke to patient - RN  Informed patient that pharmacy has prescription an  is  Partial available today for pick up.  patient verbalized understanding.

## 2018-06-14 NOTE — Telephone Encounter (Signed)
New Message:    *STAT* If patient is at the pharmacy, call can be transferred to refill team.   1. Which medications need to be refilled? (please list name of each medication and dose if known) potassium chloride (K-DUR) 10 MEQ tablet  2. Which pharmacy/location (including street and city if local pharmacy) is medication to be sent to? TAKE 2 TABLETS BY MOUTH ONCE DAILY  3. Do they need a 30 day or 90 day supply? 30 Days

## 2018-06-15 NOTE — Telephone Encounter (Signed)
MR please advise. Thanks! 

## 2018-06-15 NOTE — Telephone Encounter (Signed)
I sent a results message to [facility] this morning but see red   Ct Super D Chest Wo Contrast  Result Date: 06/13/2018 CLINICAL DATA:  History of colon cancer in 2012. Multiple lung nodules for evaluation under bronchoscopy. EXAM: CT CHEST WITHOUT CONTRAST TECHNIQUE: Multidetector CT imaging of the chest was performed using thin slice collimation for electromagnetic bronchoscopy planning purposes, without intravenous contrast. COMPARISON:  Chest CT 03/07/2018 and 08/25/2014. FINDINGS: Cardiovascular: Extensive atherosclerosis of the aorta, great vessels and coronary arteries status post median sternotomy, CABG, aortic and mitral valve replacement. There are extensive mitral annular calcifications. Left subclavian pacemaker leads extend into the right atrium and right ventricle. The heart size is normal. There is no pericardial effusion. Mediastinum/Nodes: There are no enlarged mediastinal, hilar or axillary lymph nodes.Small mediastinal lymph nodes are stable. The thyroid gland, trachea and esophagus demonstrate no significant findings. Lungs/Pleura: Pleural thickening posteriorly in the right hemithorax is stable. There is no significant pleural effusion. There is stable subpleural scarring posteriorly in the right lower lobe. There is central airway thickening with scattered tree-in-bud nodularity in both lungs, similar to the most recent prior study. Nodular scarring in both upper lobes is stable. The right lower lobe perifissural nodule present on the most recent study has resolved. There is a stable 6 x 5 mm right lower lobe nodule on image 119/3. No new or enlarging pulmonary nodules. Upper abdomen: The visualized upper abdomen appears stable without suspicious findings. There is aortic and branch vessel atherosclerosis. Musculoskeletal/Chest wall: There is no chest wall mass or suspicious osseous finding. Multiple subacute, healing rib fractures are present on the right. IMPRESSION: 1. Generally stable  chronic lung disease with pleuroparenchymal scarring and scattered nodularity. Most of these nodules have a tree-in-bud appearance, likely post infectious or inflammatory. The dominant right lower lobe nodule seen on the most recent study has resolved. No new or enlarging nodules. 2. No acute chest findings. Healing subacute right-sided rib fractures. 3. Extensive Aortic Atherosclerosis (ICD10-I70.0). Electronically Signed   By: Richardean Sale [name]   On: 06/13/2018 14:42

## 2018-06-19 ENCOUNTER — Encounter (HOSPITAL_COMMUNITY): Payer: Self-pay

## 2018-06-21 ENCOUNTER — Encounter (HOSPITAL_COMMUNITY): Payer: Self-pay

## 2018-06-21 ENCOUNTER — Telehealth: Payer: Self-pay | Admitting: Internal Medicine

## 2018-06-21 NOTE — Telephone Encounter (Signed)
Received disc of pt's Super D CT.  Routing to MR.

## 2018-06-26 ENCOUNTER — Encounter (HOSPITAL_COMMUNITY): Payer: Self-pay

## 2018-06-27 ENCOUNTER — Encounter: Payer: Self-pay | Admitting: Internal Medicine

## 2018-06-27 ENCOUNTER — Ambulatory Visit (INDEPENDENT_AMBULATORY_CARE_PROVIDER_SITE_OTHER): Payer: Medicare Other | Admitting: Internal Medicine

## 2018-06-27 VITALS — BP 104/56 | HR 70 | Ht 66.0 in | Wt 149.0 lb

## 2018-06-27 DIAGNOSIS — R918 Other nonspecific abnormal finding of lung field: Secondary | ICD-10-CM

## 2018-06-27 DIAGNOSIS — Z7184 Encounter for health counseling related to travel: Secondary | ICD-10-CM

## 2018-06-27 DIAGNOSIS — Z7189 Other specified counseling: Secondary | ICD-10-CM | POA: Diagnosis not present

## 2018-06-27 DIAGNOSIS — J449 Chronic obstructive pulmonary disease, unspecified: Secondary | ICD-10-CM | POA: Diagnosis not present

## 2018-06-27 DIAGNOSIS — R911 Solitary pulmonary nodule: Secondary | ICD-10-CM

## 2018-06-27 MED ORDER — PREDNISONE 10 MG PO TABS: ORAL_TABLET | ORAL | 0 refills | Status: DC

## 2018-06-27 MED ORDER — DOXYCYCLINE HYCLATE 100 MG PO TABS: 100.0000 mg | ORAL_TABLET | Freq: Two times a day (BID) | ORAL | 0 refills | Status: DC

## 2018-06-27 NOTE — Patient Instructions (Addendum)
.  Multiple lung nodules on CT Moderate COPD (chronic obstructive pulmonary disease) ([place]) Travel advice encounter    Multiple lung nodules on CT - per radiologist these nodules are new since 2015 -> may 2019 -> one of them resolved in sept 2019 but another one is stable and small at 23mm  - my guess is this is low probability for cancer - please repeat CT chest without contrast in 9 months  Moderate COPD (chronic obstructive pulmonary disease) ([place]) - stable  - continue spiriv, breo and singulair scheduled with duoneb as needed - mark flu shot as deferred  Travel aDvice  - will send 5 day handy  - Please take prednisone 40 mg x1 day, then 30 mg x1 day, then 20 mg x1 day, then 10 mg x1 day, and then 5 mg x1 day and stop  - Take doxycycline 100mg  po twice daily x 5 days; take after meals and avoid sunlight     Followup - 6 to 9 months or sooner if needed

## 2018-06-27 NOTE — Progress Notes (Signed)
PCP Ria Bush, MD  HPI    OV 08/02/2017  Chief Complaint  Patient presents with  . Follow-up    Pt states that she got bronchitis x1 month ago. Has been on two rounds of abx and two rounds of steroids. States that it is not in her chest anymore but is still having drainage, sore throat, dry cough, occ. SOB. Denies any CP.    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 She comes for routine follow-up. Recently she had COPD flareup was treated by primary care. Then I called in antibiotic and repeat prednisone and this again helped her. Nevertheless she is frustrated by postnasal drainage and the resultant cough. She says normally for COPD exacerbations are associated with initial onset of sinus congestion. Then she's always left with a residual postnasal drip but this time it has lingered longer. She says Sudafed helps her but is no longer available over-the-counter. She really wants relief about this critically. COPD cat score is 14. Is also asking me to sign handicap parking sticker   OV 10/31/2017  Chief Complaint  Patient presents with  . Follow-up    In hosp. 11/13-11/15 due to sepsis from UTI. Pt states she is doing good but can't seem to get her energy back. Pt currently has laryngitis and SOB with exertion.    Follow-up Gold stage II/III COPD not on home oxygen but on triple inhaler therapy.  Is a routine follow-up.  Since I last saw her in the middle of November 2018 she got diagnosed with E. coli sepsis and admitted to the hospital.  Since then she has significant amount of fatigue.  The fatigue is not improved much.  She has suspended her maintenance pulmonary rehabilitation but plans to restart it in March 2019.  She has not had any COPD exacerbations.  She is up-to-date with the flu shot.  Overall COPD CAT score is stable but there is an increase in the level of fatigue.  The only new issue that she complains about is some mild  hoarseness in voice going on for several months.  This is present early in the morning when she wakes up and then as the day goes on it is better.  She notices it when she talks a lot of time in the phone after a long period of rest.  It is insidious onset.  It is not progressive.  There are no other associated symptoms.   OV 02/05/2018  Chief Complaint  Patient presents with  . Follow-up    Pt states she saw PCP two weeks ago and had pna. Pt states she is still not over the pna and has no energy, has hoarseness in voice, cough, and has chest tightness.     Follow-up gold stage II/III COPD not on home oxygen but on triple inhaler therapy with Singulair  Routine follow-up.  She tells me t of labs from January 22, 2018 hat January 22, 2018 she started feeling chest tightness wheezing and also increase head cold.  She saw her primary care physician Dr. Danise Mina.  Apparently all the blood work was normal except for mild increase in liver enzymes which she says she fluctuates with.  He was initially thinking a sinus congestion but when he did an x-ray of the report came back as a right lower lobe pneumonia [I personally visualized this x-ray and I agree she might not have right lower lobe pneumonia because in 2015  CT chest she had some chronic scarring at that area].  She was treated with 5-day Levaquin and a Medrol Dosepak but despite this other than subjective fevers are resolving she still has significant cough congestion and chest tightness without any orthopnea proximal nocturnal dyspnea.  COPD CAT score shows significant deterioration.  She is not feeling well.  Of note, she is intentionally losing weight because of weight watchers but she has not increased her fluid intake adequately.  She has quit all diet soda  Show slight increase in liver enzymes and also slight increase in creatinine compared to 6 months ago.   OV 03/13/2018  Chief Complaint  Patient presents with  . Follow-up    Breathing  is improved since last OV. Still reports DOE and occasional cough. Denies chest tightness or wheezing.    [name]  presents for follow-up of her gold stage II /3 of COPD.  On triple inhaler therapy with Spiriva and Brio and also on Singulair   Overall in terms of her COPD: She is stable.  She is compliant with her inhalers.  She is not on oxygen therapy.  Her symptoms actually improved.  In the interim she was diagnosed with atrial fibrillation and she is placed on Xarelto.  She is frustrated because she has to be on 1 more extra medication.  She did have pulmonary function test that is documented below because it has been a while since we did one.  It actually shows improvement in the lung function.  She also had a CT scan of the chest because the last one was in 2015.  This shows scattered lung nodules with the largest one 8 mm in the right lower lobe.  The radiologist has described this is nonspecific in appearance.  She heard the result and she has been extremely anxious and upset.  She is worried that her colon cancer has metastasized or she has lung cancer.  Of note, I personally visualized the CT chest and agree with the findings   OV 06/27/2018  Subjective:  Patient ID: [name], female , DOB: 10/11/1945 , age 71 y.o. , MRN: [id number] , ADDRESS: [address] [place] [address]   06/27/2018 -   Chief Complaint  Patient presents with  . Follow-up    Pt states her breathing has been doing good since last visit. States she has been having some BP issues to the point she fainted and cracked some ribs.  Denies any problems with cough or CP.     HPI [name] 71 y.o. - follow-up for  - COPD: Overall this is stable with inhaler therapy. She has not had her flu shot. She wants to wait 2 weeks because she just had first shot of her shingles vaccine. She is up-to-date with her pneumonia vaccine. In the interim she has lost weight 60 pounds intentionally and this is  improved on Score  Multiple  Lung nodules: She had follow-up CT chest in September 2019 and compared to May 2019: One of the nodules is resolved to 16 mm and stable. She does have some rib fractures subacute and this is because she fell down. She's been having balance issues with the weight loss and orthostatic hypotension. Both of this is being managed by neurosurgery and primary care  Travel advice: She plans to have back surgery for her foot drop after this she plans to recover at her son's home near [place]. Therefore she wants a antibiotic and prednisone taper   to be handy with her in case of a future COPD exacerbation.      CAT COPD Symptom & Quality of Life Score (GSK trademark) 0 is no burden. 5 is highest burden 08/02/2017  10/31/2017  02/05/2018  06/27/2018 60+ # intentioal weight loss  Never Cough -> Cough all the time 1 2 4 1   No phlegm in chest -> Chest is full of phlegm 0 0 3 0  No chest tightness -> Chest feels very tight 1 0 3 0  No dyspnea for 1 flight stairs/hill -> Very dyspneic for 1 flight of stairs 5 4 5 4   No limitations for ADL at home -> Very limited with ADL at home 1 0 5 3  Confident leaving home -> Not at all confident leaving home 0 0 3 1  Sleep soundly -> Do not sleep soundly because of lung condition 4 4 3 3   Lots of Energy -> No energy at all 2 3 5 4   TOTAL Score (max 40)  14 13 31 16    Results for [name], [name] (MRN [id number]) as of 03/13/2018 11:58  Ref. Range 11/29/2013 16:04 04/01/2016 10:10 03/13/2018 10:36  FEV1-Pre Latest Units: L 0.93 1.35 1.42  FEV1-%Pred-Pre Latest Units: % 34 55 59   Results for RYIN, SCHILLO (MRN [id number]) as of 03/13/2018 11:58  Ref. Range 03/13/2018 10:36  DLCO cor Latest Units: ml/min/mmHg 22.70  DLCO cor % pred Latest Units: % 84     CT chest Sept 4, 2019 Lungs/Pleura: Pleural thickening posteriorly in the right hemithorax is stable. There is no significant pleural effusion. There is stable subpleural scarring  posteriorly in the right lower lobe. There is central airway thickening with scattered tree-in-bud nodularity in both lungs, similar to the most recent prior study. Nodular scarring in both upper lobes is stable. The right lower lobe perifissural nodule present on the most recent study has resolved. There is a stable 6 x 5 mm right lower lobe nodule on image 119/3. No new or enlarging pulmonary nodules.  IMPRESSION: 1. Generally stable chronic lung disease with pleuroparenchymal scarring and scattered nodularity. Most of these nodules have a tree-in-bud appearance, likely post infectious or inflammatory. The dominant right lower lobe nodule seen on the most recent study has resolved. No new or enlarging nodules. 2. No acute chest findings. Healing subacute right-sided rib fractures. 3. Extensive Aortic Atherosclerosis (ICD10-I70.0).   Electronically Signed   By: Richardean Sale [name]   On: 06/13/2018 14   ROS - per HPI     has a past medical history of Acute cystitis without hematuria (12/22/2015), Anemia, Arthritis, CAP (community acquired pneumonia) (02/08/2016), Cataracts, bilateral, Chronic diastolic heart failure ([place]) (09/19/2014), Chronic insomnia, Constipation, COPD (chronic obstructive pulmonary disease) ([place]), DDD (degenerative disc disease), Essential hypertension, GERD (gastroesophageal reflux disease), History of pneumonia (02/16/2016), History of radiation therapy (05/30/11 to 07/07/11), History of shingles, Hyperlipemia, Legally blind in left eye, as defined in [place], Lung nodule, Obesity, Osteopenia (12/2014), Pancreatitis (11/2014), Rectal cancer ([place]) (04/2011), Research study patient (08/26/2016), Rheumatic heart disease mitral stenosis, S/P AVR (aortic valve replacement) (2015), S/P MVR (mitral valve replacement) (2015), Seizures ([place]), Sepsis secondary to UTI ([place]) (08/22/2017), Severe aortic valve stenosis  AVR 3/15 (10/01/2013), Sleep apnea, Small bowel obstruction,  partial ([place]) (08/2013), and Vitamin B12 deficiency (02/28/2015).   reports that she quit smoking about 43 years ago. Her smoking use included cigarettes. She has a 15.00 pack-year smoking history. She has never used smokeless tobacco.  Past Surgical History:  Procedure Laterality Date  . AORTIC VALVE REPLACEMENT N/A 12/12/2013   Procedure: AORTIC VALVE REPLACEMENT (AVR);  Surgeon: Gaye Pollack, MD; Service: Open Heart Surgery  . BACK SURGERY  2006, 2007   2006 SPACER, 2007 decompression and fusion L4/5  . BOWEL RESECTION  04/02/2012   Procedure: SMALL BOWEL RESECTION;  Surgeon: Stark Klein, MD;  Location: [facility];  Service: General;  Laterality: N/A;  . CARPAL TUNNEL RELEASE Bilateral   . CATARACT EXTRACTION W/ INTRAOCULAR LENS IMPLANT Left 10/18/2016  . CATARACT EXTRACTION W/ INTRAOCULAR LENS IMPLANT Right 12/13/2016   Dr. Kathrin Penner  . CHOLECYSTECTOMY N/A 01/21/2015   chronic cholecystitis, Stark Klein, MD  . COLON RESECTION  08/25/2011   Procedure: COLON RESECTION LAPAROSCOPIC;  Surgeon: Stark Klein, MD;  Location: [facility];  Service: General;  Laterality: N/A;  Laparoscopic Assisted Low Anterior Resection Diverting Ostomy and onQ pain pump  . COLONOSCOPY  03/2013   1 polyp, rpt 3 yrs Ardis Hughs)  . COLONOSCOPY WITH PROPOFOL N/A 06/09/2016   patent colo-colonic anastomosis, rpt 5 yrs Ardis Hughs)  . EP IMPLANTABLE DEVICE N/A 06/16/2016   Procedure: Pacemaker Implant;  Surgeon: Will Meredith Leeds, MD;  Location: [facility];  Service: Cardiovascular;  Laterality: N/A;  . FOOT SURGERY  left foot   hammer toe  . HAND SURGERY  Bil   carpal tunnel  . ILEOSTOMY  08/25/2011  . ILEOSTOMY CLOSURE  04/02/2012   Procedure: ILEOSTOMY TAKEDOWN;  Surgeon: Stark Klein, MD;  Location: [facility];  Service: General;  Laterality: N/A;  . INTRAOPERATIVE TRANSESOPHAGEAL ECHOCARDIOGRAM N/A 12/12/2013   Procedure: INTRAOPERATIVE TRANSESOPHAGEAL ECHOCARDIOGRAM;  Surgeon: Gaye Pollack, MD;  Location: [facility];   Service: Open Heart Surgery;  Laterality: N/A;  . LEFT AND RIGHT HEART CATHETERIZATION WITH CORONARY ANGIOGRAM N/A 11/27/2013   Procedure: LEFT AND RIGHT HEART CATHETERIZATION WITH CORONARY ANGIOGRAM;  Surgeon: Sanda Klein, MD;  Location: [facility];  Service: Cardiovascular;  Laterality: N/A;  . MITRAL VALVE REPLACEMENT N/A 12/12/2013   Procedure: MITRAL VALVE (MV) REPLACEMENT OR REPAIR;  Surgeon: Gaye Pollack, MD; Service: Open Heart Surgery  . TEE WITHOUT CARDIOVERSION N/A 11/29/2013   Procedure: TRANSESOPHAGEAL ECHOCARDIOGRAM (TEE);  Surgeon: Sueanne Margarita, MD;  Location: [facility]OS[facility];  Service: Cardiovascular;  Laterality: N/A;  . TONSILLECTOMY      Allergies  Allergen Reactions  . Sulfa Antibiotics Nausea Only  . Codeine Nausea Only  . Prednisone Hives and Other (See Comments)    In different places all over the body.  . Sulfonamide Derivatives Nausea Only    Immunization History  Administered Date(s) Administered  . H1N1 09/17/2008  . Influenza Split 07/18/2012  . Influenza Whole 08/10/2005, 06/24/2009, 07/11/2011  . Influenza, High Dose Seasonal PF 07/04/2016, 07/04/2017  . Influenza-Unspecified 07/24/2013, 06/30/2014, 06/30/2015  . Pneumococcal Conjugate-13 06/02/2014  . Pneumococcal Polysaccharide-23 08/10/2005, 04/21/2008, 06/25/2013  . Td 10/11/2005  . Zoster 09/16/2014    Family History  Problem Relation Age of Onset  . Lung cancer Father   . Heart disease Mother   . Diabetes Mother   . Hypertension Mother   . Stroke Mother   . CAD Son 23  . Heart attack Son 38  . Heart attack Maternal Grandfather   . Breast cancer Maternal Aunt 83  . Colon cancer Neg Hx   . Esophageal cancer Neg Hx   . Stomach cancer Neg Hx   . Rectal cancer Neg Hx      Current Outpatient Medications:  .  acetaminophen (TYLENOL) 325  MG tablet, Take 650 mg by mouth every 6 (six) hours as needed for mild pain or headache. , Disp: , Rfl:  .  albuterol (PROVENTIL HFA;VENTOLIN HFA) 108  (90 Base) MCG/ACT inhaler, Inhale 2 puffs into the lungs every 6 (six) hours as needed for wheezing., Disp: 1 Inhaler, Rfl: 2 .  Biotin 10 MG CAPS, Take 10 mg by mouth daily. , Disp: , Rfl:  .  bisoprolol (ZEBETA) 5 MG tablet, Take 0.5 tablets (2.5 mg total) by mouth daily., Disp: 45 tablet, Rfl: 1 .  Calcium Carbonate-Vitamin D (CALCIUM 600 + D PO), Take 1 tablet by mouth daily. , Disp: , Rfl:  .  Cholecalciferol (VITAMIN D) 2000 UNITS CAPS, Take 2,000 Units by mouth daily. , Disp: , Rfl:  .  fluticasone furoate-vilanterol (BREO ELLIPTA) 200-25 MCG/INH AEPB, Inhale 1 puff into the lungs daily., Disp: 60 each, Rfl: 8 .  furosemide (LASIX) 40 MG tablet, TAKE 1 TABLET BY MOUTH EVERY DAY 5 TIMES A WEEK, Disp: 20 tablet, Rfl: 11 .  ipratropium-albuterol (DUONEB) 0.5-2.5 (3) MG/3ML SOLN, Take 3 mLs by nebulization every 6 (six) hours as needed., Disp: 360 mL, Rfl: 0 .  lovastatin (MEVACOR) 40 MG tablet, TAKE 1 TABLET BY MOUTH AT BEDTIME, Disp: 30 tablet, Rfl: 4 .  Miconazole Nitrate 2 % POWD, Apply 1 application topically daily as needed (for skin). Apply to stomach and under breasts, Disp: , Rfl:  .  montelukast (SINGULAIR) 10 MG tablet, TAKE 1 TABLET BY MOUTH AT BEDTIME, Disp: 30 tablet, Rfl: 5 .  Multiple Vitamins-Minerals (PRESERVISION AREDS 2 PO), Take 2 capsules by mouth daily., Disp: , Rfl:  .  nitrofurantoin, macrocrystal-monohydrate, (MACROBID) 100 MG capsule, Take 100 mg by mouth daily., Disp: , Rfl:  .  polycarbophil (FIBERCON) 625 MG tablet, Take 625 mg by mouth daily., Disp: , Rfl:  .  polyethylene glycol (MIRALAX / GLYCOLAX) packet, Take 17 g by mouth daily., Disp: 14 each, Rfl: 0 .  Polyvinyl Alcohol-Povidone (REFRESH OP), Place 2 drops into both eyes daily as needed (for dry eyes)., Disp: , Rfl:  .  potassium chloride (K-DUR) 10 MEQ tablet, TAKE 2 TABLETS BY MOUTH ONCE DAILY, Disp: 60 tablet, Rfl: 5 .  rivaroxaban (XARELTO) 20 MG TABS tablet, Take 1 tablet (20 mg total) by mouth daily  with supper., Disp: 30 tablet, Rfl: 6 .  SPIRIVA HANDIHALER 18 MCG inhalation capsule, INHALE THE CONTENTS OF ONE CAPSULE IN THE HANDIHALER ONCE DAILY, Disp: 30 capsule, Rfl: 3 .  TOVIAZ 8 MG TB24 tablet, Take 1 tablet by mouth daily., Disp: , Rfl: 11 .  traMADol (ULTRAM) 50 MG tablet, Take 0.5-1 tablets (25-50 mg total) by mouth 2 (two) times daily as needed for moderate pain., Disp: 30 tablet, Rfl: 0 .  vitamin B-12 (CYANOCOBALAMIN) 500 MCG tablet, Take 500 mcg by mouth daily., Disp: , Rfl:  .  amoxicillin (AMOXIL) 500 MG capsule, take 4 capsules by mouth 30-60 MINUTES PRIOR TO DENTAL APPT (Patient not taking: Reported on 06/27/2018), Disp: 4 capsule, Rfl: 12      Objective:   Vitals:   06/27/18 1003  BP: (!) 104/56  Pulse: 70  SpO2: 97%  Weight: 149 lb (67.6 kg)  Height: 5\' 6"  (1.676 m)    Estimated body mass index is 25.5 kg/m as calculated from the following:   Height as of 05/30/18: 5\' 6"  (1.676 m).   Weight as of 05/30/18: 158 lb (71.7 kg).  @WEIGHTCHANGE @  There were no vitals filed for this  visit.   Physical Exam  General Appearance:    Alert, cooperative, no distress, appears stated age - yes , sitting on - chair. Lost weigh5  Head:    Normocephalic, without obvious abnormality, atraumatic  Eyes:    PERRL, conjunctiva/corneas clear,  Ears:    Normal TM's and external ear canals, both ears  Nose:   Nares normal, septum midline, mucosa normal, no drainage    or sinus tenderness. OXYGEN ON  - no . Patient is @ ra   Throat:   Lips, mucosa, and tongue normal; teeth and gums normal. Cyanosis on lips - no  Neck:   Supple, symmetrical, trachea midline, no adenopathy;    thyroid:  no enlargement/tenderness/nodules; no carotid   bruit or JVD  Back:     Symmetric, no curvature, ROM normal, no CVA tenderness  Lungs:     Distress - no , Wheeze no, Barrell Chest - yes, Purse lip breathing - no, Crackles - no   Chest Wall:    No tenderness or deformity. Scars in chest no   Heart:     Regular rate and rhythm, S1 and S2 normal, no rub   or gallop, Murmur - +  Breast Exam:    NOT DONE  Abdomen:     Soft, non-tender, bowel sounds active all four quadrants,    no masses, no organomegaly  Genitalia:   NOT DONE  Rectal:   NOT DONE  Extremities:   Extremities normal, atraumatic, Clubbing - no, Edema - no  Pulses:   2+ and symmetric all extremities  Skin:   Stigmata of Connective Tissue Disease - no  Lymph nodes:   Cervical, supraclavicular, and axillary nodes normal  Psychiatric:  Neurologic:   pleaant CNII-XII intact, normal strength, sensation  throughout           Assessment:       ICD-10-CM   1. Multiple lung nodules on CT R91.8   2. Moderate COPD (chronic obstructive pulmonary disease) (HCC) J44.9   3. Travel advice encounter Z71.89        Plan:       Multiple lung nodules on CT - per radiologist these nodules are new since 2015 -> may 2019 -> one of them resolved in sept 2019 but another one is stable and small at 35mm  - my guess is this is low probability for cancer - please repeat CT chest without contrast in 9 months  Moderate COPD (chronic obstructive pulmonary disease) ([place]) - stable  - continue spiriv, breo and singulair scheduled with duoneb as needed - mark flu shot as deferred  Travel aDvice  - will send 5 day handy  - Please take prednisone 40 mg x1 day, then 30 mg x1 day, then 20 mg x1 day, then 10 mg x1 day, and then 5 mg x1 day and stop  - Take doxycycline 100mg  po twice daily x 5 days; take after meals and avoid sunlight     Followup - 6 to 9 months or sooner if needed           SIGNATURE    Dr. Brand Males, [name], F.C.C.P,  Pulmonary and Critical Care Medicine Staff Physician, [facility] Director - Interstitial Lung Disease  Program  P[facility] [place], [place], [address]  Pager: [phone], If no answer or between  15:00h - 7:00h:  call [phone] Telephone: [phone]  10:04 AM  06/27/2018  

## 2018-06-27 NOTE — Telephone Encounter (Signed)
Gong to observe nodules. We do not need the super D anymore

## 2018-06-28 ENCOUNTER — Encounter (HOSPITAL_COMMUNITY): Payer: Self-pay

## 2018-06-29 ENCOUNTER — Telehealth: Payer: Self-pay

## 2018-06-29 ENCOUNTER — Other Ambulatory Visit: Payer: Self-pay | Admitting: Neurological Surgery

## 2018-06-29 NOTE — Telephone Encounter (Signed)
Patient aware that she needs to hold her xarelto for 3 days. Will fax clearance to Dr.Elsner.

## 2018-06-29 NOTE — Telephone Encounter (Signed)
   [facility] Pre-operative Risk Assessment    Request for surgical clearance:  1. What type of surgery is being performed?  L2-3, L3-4 anterolateral lumbar interbody fusion with lateral plate   2. When is this surgery scheduled? 07/17/18   3. What type of clearance is required (medical clearance vs. Pharmacy clearance to hold med vs. Both)? BOTH  4. Are there any medications that need to be held prior to surgery and how long? Xarelto   5. Practice name and name of physician performing surgery? [facility], Dr. Ellene Route  6. What is your office phone number [phone]     7.   What is your office fax number  [phone]  8.   Anesthesia type (None, local, MAC, general) ? Unknown   Michaelyn Barter 06/29/2018, 2:25 PM  _________________________________________________________________   (provider comments below)

## 2018-06-29 NOTE — Telephone Encounter (Signed)
Patient with diagnosis of atrial fibrillation on Xarelto for anticoagulation.    Procedure: L2-3 and L3-4 anterolateral lumbar intoerbody fusion with lateral plate Date of procedure: 07-17-18  CHADS2-VASc score of  4 (CHF, HTN, AGE, DM2, stroke/tia x 2, CAD, AGE, female)  CrCl 45.7 Platelet count 132  Per office protocol, patient can hold Xarelto for 3 days prior to procedure.

## 2018-06-29 NOTE — Telephone Encounter (Signed)
   Primary Cardiologist: Sanda Klein, MD  Chart reviewed as part of pre-operative protocol coverage. Patient was contacted 06/29/2018 in reference to pre-operative risk assessment for pending surgery as outlined below.  [name] was last seen on 05/30/18 by Dr. Curt Bears.  Since that day, [name] has done well w/o any cardiac symptoms. No CP or dyspnea  Therefore, based on ACC/AHA guidelines, the patient would be at acceptable risk for the planned procedure without further cardiovascular testing.   Pharmacy to address anticoagulation. Recommendations pending.   I will route this recommendation to the requesting party via Epic fax function and remove from pre-op pool.  Please call with questions.  Lyda Jester, PA-C 06/29/2018, 3:53 PM

## 2018-06-29 NOTE — Telephone Encounter (Signed)
LVM to callback regarding surgical clearance.

## 2018-07-03 ENCOUNTER — Encounter (HOSPITAL_COMMUNITY): Payer: Self-pay

## 2018-07-05 ENCOUNTER — Encounter (HOSPITAL_COMMUNITY): Payer: Self-pay

## 2018-07-06 ENCOUNTER — Telehealth: Payer: Self-pay | Admitting: *Deleted

## 2018-07-06 NOTE — Telephone Encounter (Signed)
Spoke with patient. She denies any symptoms with AF, no increased fatigue, activity limitation, or increased ShOB from baseline. She continues to have dizzy spells where she notes low BPs. She was told by her PCP that these episodes are BP-related and describes having orthostatic vitals done. She has been taking Xarelto and bisoprolol as prescribed, but does not feel like her dizzy spells have improved since bisoprolol was decreased back to 2.5mg  daily. Episodes don't happen daily, but when they do happen, she often has more than one a day. Advised I will forward message to Dr. Curt Bears and Venida Jarvis, RN, for any further recommendations.  Patient has upcoming surgery on 07/17/18. She reports her surgeon's instructions list her holding Xarelto beginning on 10/1, while our recommendations were to hold x3 days prior to procedure. Advised patient to contact surgeon's office for clarification. She verbalizes understanding of all instructions and thanked me for my call.

## 2018-07-06 NOTE — Telephone Encounter (Signed)
LMOM requesting call back to the [facility]. Gave direct number.   Received [facility] alert for persistent AF since ~06/05/18. Will determine if patient symptomatic. Noted that patient is scheduled to hold Xarelto for 3 days prior to procedure on 07/17/18.

## 2018-07-08 ENCOUNTER — Other Ambulatory Visit: Payer: Self-pay | Admitting: Family Medicine

## 2018-07-09 ENCOUNTER — Encounter: Payer: Self-pay | Admitting: Family Medicine

## 2018-07-09 NOTE — Telephone Encounter (Signed)
Electronic refill request Last office visit 05/28/18 Last refill 06/06/18 60/8 See allergy/contraindication

## 2018-07-10 ENCOUNTER — Other Ambulatory Visit: Payer: Self-pay | Admitting: Family Medicine

## 2018-07-10 ENCOUNTER — Encounter: Payer: Self-pay | Admitting: Family Medicine

## 2018-07-10 NOTE — Telephone Encounter (Signed)
ERROR

## 2018-07-10 NOTE — Pre-Procedure Instructions (Signed)
[name]  07/10/2018      [id number] - Lorina Rabon, [place] AT [place] [address] [place] [address] Phone: [phone] Fax: [phone]    Your procedure is scheduled on Tuesday October 8th.  Report to [facility] Admitting at [place].M.  Call this number if you have problems the morning of surgery:  [phone]   Remember:  Do not eat or drink after midnight.     Take these medicines the morning of surgery with A SIP OF WATER   Tylenol (if needed)  Albuterol (if needed) Bring inhaler with you  Bisoprolol   Breo Inhaler (if needed)  Spiriva   Toviaz  Tramadol (if needed)  Follow your surgeons instructions for when to stop taking your Xarelto.    7 days prior to surgery STOP taking any Aspirin(unless otherwise instructed by your surgeon), Aleve, Naproxen, Ibuprofen, Motrin, Advil, Goody's, BC's, all herbal medications, fish oil, and all vitamins     Do not wear jewelry, make-up or nail polish.  Do not wear lotions, powders, or perfumes, or deodorant.  Do not shave 48 hours prior to surgery.  Men may shave face and neck.  Do not bring valuables to the hospital.  [facility] is not responsible for any belongings or valuables.  Contacts, dentures or bridgework may not be worn into surgery.  Leave your suitcase in the car.  After surgery it may be brought to your room.  For patients admitted to the hospital, discharge time will be determined by your treatment team.  Patients discharged the day of surgery will not be allowed to drive home.    [place]- Preparing For Surgery  Before surgery, you can play an important role. Because skin is not sterile, your skin needs to be as free of germs as possible. You can reduce the number of germs on your skin by washing with CHG (chlorahexidine gluconate) Soap before surgery.  CHG is an antiseptic cleaner which kills germs  and bonds with the skin to continue killing germs even after washing.    Oral Hygiene is also important to reduce your risk of infection.  Remember - BRUSH YOUR TEETH THE MORNING OF SURGERY WITH YOUR REGULAR TOOTHPASTE  Please do not use if you have an allergy to CHG or antibacterial soaps. If your skin becomes reddened/irritated stop using the CHG.  Do not shave (including legs and underarms) for at least 48 hours prior to first CHG shower. It is OK to shave your face.  Please follow these instructions carefully.   1. Shower the NIGHT BEFORE SURGERY and the MORNING OF SURGERY with CHG.   2. If you chose to wash your hair, wash your hair first as usual with your normal shampoo.  3. After you shampoo, rinse your hair and body thoroughly to remove the shampoo.  4. Use CHG as you would any other liquid soap. You can apply CHG directly to the skin and wash gently with a scrungie or a clean washcloth.   5. Apply the CHG Soap to your body ONLY FROM THE NECK DOWN.  Do not use on open wounds or open sores. Avoid contact with your eyes, ears, mouth and genitals (private parts). Wash Face and genitals (private parts)  with your normal soap.  6. Wash thoroughly, paying special attention to the area where your surgery will be performed.  7. Thoroughly rinse your body  with warm water from the neck down.  8. DO NOT shower/wash with your normal soap after using and rinsing off the CHG Soap.  9. Pat yourself dry with a CLEAN TOWEL.  10. Wear CLEAN PAJAMAS to bed the night before surgery, wear comfortable clothes the morning of surgery  11. Place CLEAN SHEETS on your bed the night of your first shower and DO NOT SLEEP WITH PETS.    Day of Surgery:  Do not apply any deodorants/lotions.  Please wear clean clothes to the hospital/surgery center.   Remember to brush your teeth WITH YOUR REGULAR TOOTHPASTE.    Please read over the following fact sheets that you were given.

## 2018-07-10 NOTE — Telephone Encounter (Signed)
Advised pt to stop Bisoprolol. Will continue to monitor AFib.  Pt understands we will re-address medication if AFib burden continues. She is agreeable to plan and appreciates my call.

## 2018-07-11 ENCOUNTER — Encounter (HOSPITAL_COMMUNITY): Payer: Self-pay

## 2018-07-11 ENCOUNTER — Other Ambulatory Visit: Payer: Self-pay

## 2018-07-11 ENCOUNTER — Encounter (HOSPITAL_COMMUNITY)
Admission: RE | Admit: 2018-07-11 | Discharge: 2018-07-11 | Disposition: A | Discharge status: Home / Self Care | Payer: Medicare Other | Source: Ambulatory Visit | Attending: Neurological Surgery | Admitting: Neurological Surgery

## 2018-07-11 DIAGNOSIS — Z01812 Encounter for preprocedural laboratory examination: Secondary | ICD-10-CM | POA: Diagnosis not present

## 2018-07-11 HISTORY — DX: Dyspnea, unspecified: R06.00

## 2018-07-11 LAB — SURGICAL PCR SCREEN
MRSA, PCR: NEGATIVE
Staphylococcus aureus: NEGATIVE

## 2018-07-11 LAB — BASIC METABOLIC PANEL
Anion gap: 10 (ref 5–15)
BUN: 24 mg/dL — ABNORMAL HIGH (ref 8–23)
CO2: 27 mmol/L (ref 22–32)
Calcium: 9.7 mg/dL (ref 8.9–10.3)
Chloride: 99 mmol/L (ref 98–111)
Creatinine, Ser: 0.9 mg/dL (ref 0.44–1.00)
GFR calc Af Amer: >60 mL/min (ref >60)
GFR calc non Af Amer: >60 mL/min (ref >60)
Glucose, Bld: 90 mg/dL (ref 70–99)
Potassium: 3.9 mmol/L (ref 3.5–5.1)
Sodium: 136 mmol/L (ref 135–145)

## 2018-07-11 LAB — CBC
HCT: 47 % — ABNORMAL HIGH (ref 36.0–46.0)
Hemoglobin: 15.4 g/dL — ABNORMAL HIGH (ref 12.0–15.0)
MCH: 33.3 pg (ref 26.0–34.0)
MCHC: 32.8 g/dL (ref 30.0–36.0)
MCV: 101.7 fL — ABNORMAL HIGH (ref 78.0–100.0)
Platelets: 114 10*3/uL — ABNORMAL LOW (ref 150–400)
RBC: 4.62 MIL/uL (ref 3.87–5.11)
RDW: 14.1 % (ref 11.5–15.5)
WBC: 5.4 10*3/uL (ref 4.0–10.5)

## 2018-07-11 LAB — TYPE AND SCREEN
ABO/RH(D): A NEG
Antibody Screen: NEGATIVE

## 2018-07-11 NOTE — Progress Notes (Signed)
PCP - Dr. Danise Mina Cardiologist - Dr. Sallyanne Kuster PPM - Dr. Curt Bears  Chest x-ray -01/22/18  EKG - 05/30/18 Stress Test - 02/2018 ECHO - 12/2017 Cardiac Cath - 2015  Blood Thinner Instructions: Last dose will be taken on October 3rd.  Aspirin Instructions: N/A  Anesthesia review: Cardiac Clearance, PPM  Patient denies shortness of breath, fever, cough and chest pain at PAT appointment   Patient verbalized understanding of instructions that were given to them at the PAT appointment. Patient was also instructed that they will need to review over the PAT instructions again at home before surgery.

## 2018-07-11 NOTE — Progress Notes (Signed)
Windle Guard with [employer] called and informed of pt surgery date and start time. Aaron Edelman said he would be here at 0700 to check PPM and await further instructions.

## 2018-07-12 NOTE — Progress Notes (Signed)
Anesthesia Chart Review:  Case:  [id number] Date/Time:  07/17/18 0715   Procedure:  Lumbar 2-3 Lumbar 3-4 Anterolateral decompression/interbody fusion with lateral plate fixation/Infuse (N/A ) - Lumbar 2-3 Lumbar 3-4 Anterolateral decompression/interbody fusion with lateral plate fixation/Infuse   Anesthesia type:  General   Pre-op diagnosis:  Foraminal stenosis of lumbar region   Location:  [facility] OR ROOM 21 / [facility]   Surgeon:  Kristeen Miss, MD      DISCUSSION: 72 yo female former smoker. Pertinent hx includes PONV, Anemia, Lung nodule (stable, monitoring), Chronic diastolic HF, GERD, COPD, HTN, OSA not on CPAP, Legally blind left eye, DOE. S/p AVR and MVR 2015.  Cardiac clearance per Ellen Henri telephone encounter 06/29/2018 "Patient was contacted 06/29/2018 in reference to pre-operative risk assessment for pending surgery as outlined below.  [name] was last seen on 05/30/18 by Dr. Curt Bears. Since that day, [name] has done well w/o any cardiac symptoms. No CP or dyspnea.  Therefore, based on ACC/AHA guidelines, the patient would be at acceptable risk for the planned procedure without further cardiovascular testing."  Per telephone encounter 06/29/2018 by Tommy Medal [facility] "Per office protocol, patient can hold Xarelto for 3 days prior to procedure."  Device orders on chart. [employer] rep will be present to check device.   Anticipate he can proceed as planned barring acute status change.  VS: BP 129/68   Pulse 71   Resp 20   Ht 5\' 6"  (1.676 m)   Wt 67.8 kg   SpO2 98%   BMI 24.13 kg/m   PROVIDERS: Ria Bush, MD is PCP  Croitoru, Dani Gobble, MD is Cardiologist  LABS: Labs reviewed: Acceptable for surgery. Platelets mildly low. Review of history shows baseline ~130. (all labs ordered are listed, but only abnormal results are displayed)  Labs Reviewed  CBC - Abnormal; Notable for the following components:      Result Value   Hemoglobin 15.4 (*)    HCT 47.0  (*)    MCV 101.7 (*)    Platelets 114 (*)    All other components within normal limits  BASIC METABOLIC PANEL - Abnormal; Notable for the following components:   BUN 24 (*)    All other components within normal limits  SURGICAL PCR SCREEN  TYPE AND SCREEN     IMAGES: CT Chest 06/13/2018: IMPRESSION: 1. Generally stable chronic lung disease with pleuroparenchymal scarring and scattered nodularity. Most of these nodules have a tree-in-bud appearance, likely post infectious or inflammatory. The dominant right lower lobe nodule seen on the most recent study has resolved. No new or enlarging nodules. 2. No acute chest findings. Healing subacute right-sided rib fractures. 3. Extensive Aortic Atherosclerosis (ICD10-I70.0).  EKG: 05/30/2018: A paced  CV: Myocardial perfusion scan 02/26/2018:  Nuclear stress EF: 57%.  The study is normal.  This is a low risk study.  The left ventricular ejection fraction is normal (55-65%).   Normal pharmacologic nuclear stress test with no evidence for prior infarct or ischemia. Normal LVEF.  TTE 06/29/2016: Study Conclusions  - Left ventricle: The cavity size was normal. Wall thickness was   increased in a pattern of mild LVH. Systolic function was normal.   The estimated ejection fraction was in the range of 60% to 65%.   Indeterminant diastolic function. Wall motion was normal; there   were no regional wall motion abnormalities. - Aortic valve: There was a bioprosthetic aortic valve. There did   not appear to be significant regurgitation or  stenosis. Mean   gradient (S): 17 mm Hg. - Mitral valve: There was a bioprosthetic mitral valve. There did   not appear to be significant stenosis or regurgitation. The   residual native mitral apparatus is severely calcified. Mean   gradient (D): 6 mm Hg. Valve area by pressure half-time: 1.8   cm^2. - Left atrium: The atrium was mildly dilated. - Right ventricle: The cavity size was normal.  Systolic function   was normal. - Tricuspid valve: Peak RV-RA gradient (S): 23 mm Hg. - Pulmonary arteries: PA peak pressure: 26 mm Hg (S). - Inferior vena cava: The vessel was normal in size. The   respirophasic diameter changes were in the normal range (= 50%),   consistent with normal central venous pressure.  Impressions:  - Normal LV size with mild LV hypertrophy. EF 60-65%. Normal RV   size and systolic function. Bioprosthetic mitral valve and   bioprosthetic aortic valve both appeared to function normally.  Past Medical History:  Diagnosis Date  . Acute cystitis without hematuria 12/22/2015  . Anemia   . Arthritis   . CAP (community acquired pneumonia) 02/08/2016  . Cataracts, bilateral    immature  . Chronic diastolic heart failure ([place]) 09/19/2014  . Chronic insomnia   . Constipation    takes Miralax daily as needed  . COPD (chronic obstructive pulmonary disease) (HCC)    Albuterol inhaler daily as needed;Duoneb daily as needed;Spiriva daily  . DDD (degenerative disc disease)    cervical - kyphosis with mod DD changes C5/6 and C6/7; lumbar - early DD at L2/3 ([place])  . Dyspnea    with exertion  . Essential hypertension    was on meds but after appointment Dec 11 with Dr.C he took her off  . GERD (gastroesophageal reflux disease)    takes Protonix daily  . History of pneumonia 02/16/2016  . History of radiation therapy 05/30/11 to 07/07/11   rectum  . History of shingles   . Hyperlipemia    takes Lovastatin daily  . Legally blind in left eye, as defined in [place]   . Lung nodule    RLL nodule-73mm stable 2006, April 2009, and June 2009  . Obesity   . Osteopenia 12/2014   T -1.5 hip  . Pancreatitis 11/2014   ?zpack related vs gallstone pancreatitis with abnormal HIDA scan pending cholecystectomy  . PONV (postoperative nausea and vomiting)   . Rectal cancer ([place]) 04/2011   T3N0; s/p lap LAR, s/p ileostomy, s/p reversal, s/p chemo  . Research study patient 08/26/2016   . Rheumatic heart disease mitral stenosis    mod MS by echo 02/2014  . S/P AVR (aortic valve replacement) 2015   bioprosthetic ([place])  . S/P MVR (mitral valve replacement) 2015   bioprosthetic ([place])  . Seizures ([place])    febrile seizures as a child  . Sepsis secondary to UTI ([place]) 08/22/2017  . Severe aortic valve stenosis  AVR 3/15 10/01/2013   mild-mod by echo 02/2014  . Sleep apnea    PT TOLD BORDERLINE-TRIED CPAP-DID NOT HELP-SHE DOES NOT USE CPAP  . Small bowel obstruction, partial ([place]) 08/2013   reolved without NGT placement.   . Vitamin B12 deficiency 02/28/2015   Start B12 shots 02/2015     Past Surgical History:  Procedure Laterality Date  . AORTIC VALVE REPLACEMENT N/A 12/12/2013   Procedure: AORTIC VALVE REPLACEMENT (AVR);  Surgeon: Gaye Pollack, MD; Service: Open Heart Surgery  . BACK SURGERY  2006, 2007  2006 SPACER, 2007 decompression and fusion L4/5  . BOWEL RESECTION  04/02/2012   Procedure: SMALL BOWEL RESECTION;  Surgeon: Stark Klein, MD;  Location: [facility] ORS;  Service: General;  Laterality: N/A;  . CARPAL TUNNEL RELEASE Bilateral   . CATARACT EXTRACTION W/ INTRAOCULAR LENS IMPLANT Left 10/18/2016  . CATARACT EXTRACTION W/ INTRAOCULAR LENS IMPLANT Right 12/13/2016   Dr. Kathrin Penner  . CHOLECYSTECTOMY N/A 01/21/2015   chronic cholecystitis, Stark Klein, MD  . COLON RESECTION  08/25/2011   Procedure: COLON RESECTION LAPAROSCOPIC;  Surgeon: Stark Klein, MD;  Location: [facility] ORS;  Service: General;  Laterality: N/A;  Laparoscopic Assisted Low Anterior Resection Diverting Ostomy and onQ pain pump  . COLONOSCOPY  03/2013   1 polyp, rpt 3 yrs Ardis Hughs)  . COLONOSCOPY WITH PROPOFOL N/A 06/09/2016   patent colo-colonic anastomosis, rpt 5 yrs Ardis Hughs)  . EP IMPLANTABLE DEVICE N/A 06/16/2016   Procedure: Pacemaker Implant;  Surgeon: Will Meredith Leeds, MD;  Location: [facility] CV LAB;  Service: Cardiovascular;  Laterality: N/A;  . FOOT SURGERY  left foot   hammer toe  .  HAND SURGERY  Bil   carpal tunnel  . ILEOSTOMY  08/25/2011  . ILEOSTOMY CLOSURE  04/02/2012   Procedure: ILEOSTOMY TAKEDOWN;  Surgeon: Stark Klein, MD;  Location: [facility] ORS;  Service: General;  Laterality: N/A;  . INTRAOPERATIVE TRANSESOPHAGEAL ECHOCARDIOGRAM N/A 12/12/2013   Procedure: INTRAOPERATIVE TRANSESOPHAGEAL ECHOCARDIOGRAM;  Surgeon: Gaye Pollack, MD;  Location: [facility] OR;  Service: Open Heart Surgery;  Laterality: N/A;  . LEFT AND RIGHT HEART CATHETERIZATION WITH CORONARY ANGIOGRAM N/A 11/27/2013   Procedure: LEFT AND RIGHT HEART CATHETERIZATION WITH CORONARY ANGIOGRAM;  Surgeon: Sanda Klein, MD;  Location: [facility] CATH LAB;  Service: Cardiovascular;  Laterality: N/A;  . MITRAL VALVE REPLACEMENT N/A 12/12/2013   Procedure: MITRAL VALVE (MV) REPLACEMENT OR REPAIR;  Surgeon: Gaye Pollack, MD; Service: Open Heart Surgery  . TEE WITHOUT CARDIOVERSION N/A 11/29/2013   Procedure: TRANSESOPHAGEAL ECHOCARDIOGRAM (TEE);  Surgeon: Sueanne Margarita, MD;  Location: [facility] ENDOSCOPY;  Service: Cardiovascular;  Laterality: N/A;  . TONSILLECTOMY      MEDICATIONS: . acetaminophen (TYLENOL) 325 MG tablet  . albuterol (PROVENTIL HFA;VENTOLIN HFA) 108 (90 Base) MCG/ACT inhaler  . amoxicillin (AMOXIL) 500 MG capsule  . Biotin 10 MG CAPS  . BREO ELLIPTA 200-25 MCG/INH AEPB  . Calcium Carbonate-Vitamin D (CALCIUM 600 + D PO)  . Cholecalciferol (VITAMIN D) 2000 UNITS CAPS  . doxycycline (VIBRA-TABS) 100 MG tablet  . furosemide (LASIX) 40 MG tablet  . ipratropium-albuterol (DUONEB) 0.5-2.5 (3) MG/3ML SOLN  . lovastatin (MEVACOR) 40 MG tablet  . montelukast (SINGULAIR) 10 MG tablet  . Multiple Vitamins-Minerals (PRESERVISION AREDS 2 PO)  . nitrofurantoin, macrocrystal-monohydrate, (MACROBID) 100 MG capsule  . polycarbophil (FIBERCON) 625 MG tablet  . polyethylene glycol (MIRALAX / GLYCOLAX) packet  . Polyvinyl Alcohol-Povidone (REFRESH OP)  . potassium chloride (K-DUR) 10 MEQ tablet  . rivaroxaban (XARELTO) 20 MG  TABS tablet  . SPIRIVA HANDIHALER 18 MCG inhalation capsule  . [place] (ZEASORB EX)  . TOVIAZ 8 MG TB24 tablet  . traMADol (ULTRAM) 50 MG tablet  . vitamin B-12 (CYANOCOBALAMIN) 500 MCG tablet   No current facility-administered medications for this encounter.     Wynonia Musty [facility] Short Stay Center/Anesthesiology Phone [phone] 07/12/2018 10:35 AM

## 2018-07-16 NOTE — Anesthesia Preprocedure Evaluation (Addendum)
Anesthesia Evaluation  Patient identified by MRN, date of birth, ID band Patient awake    Reviewed: Allergy & Precautions, NPO status , Patient's Chart, lab work & pertinent test results  History of Anesthesia Complications (+) PONV  Airway Mallampati: II  TM Distance: >3 FB Neck ROM: Full    Dental  (+) Dental Advisory Given   Pulmonary sleep apnea (does not use CPAP) , COPD,  COPD inhaler, former smoker (quit 1976),    breath sounds clear to auscultation       Cardiovascular hypertension, Pt. on medications (-) angina(-) dysrhythmias + pacemaker ([employer] dual chamber for 2:1 AVB) + Valvular Problems/Murmurs (S/P MVR and AVR 2015)  Rhythm:Regular Rate:Normal  5/19 Stress: EF 57%, normal with no evidence of ischemia or prior MI '17 ECHO: EF 60-65%, AVR and MVR functioning normally   Neuro/Psych Chronic back pain    GI/Hepatic GERD  Controlled,H/o pancreatitis H/o SBO   Endo/Other  negative endocrine ROS  Renal/GU negative Renal ROS     Musculoskeletal   Abdominal   Peds  Hematology xarelto   Anesthesia Other Findings Pt in Aflutter, pacer dependent with underlying ventricular rate 30  Reproductive/Obstetrics                            Anesthesia Physical Anesthesia Plan  ASA: III  Anesthesia Plan: General   Post-op Pain Management:    Induction: Intravenous  PONV Risk Score and Plan: 4 or greater and Scopolamine patch - Pre-op, Dexamethasone and Ondansetron  Airway Management Planned: Oral ETT  Additional Equipment:   Intra-op Plan:   Post-operative Plan: Extubation in OR  Informed Consent: I have reviewed the patients History and Physical, chart, labs and discussed the procedure including the risks, benefits and alternatives for the proposed anesthesia with the patient or authorized representative who has indicated his/her understanding and acceptance.   Dental advisory  given  Plan Discussed with: CRNA and Surgeon  Anesthesia Plan Comments: (Plan routine monitors, GETA Discussed pacemaker and rhythm with [place], will VOO for the surgery)        Anesthesia Quick Evaluation

## 2018-07-17 ENCOUNTER — Inpatient Hospital Stay (HOSPITAL_COMMUNITY): Payer: Medicare Other | Admitting: Physician Assistant

## 2018-07-17 ENCOUNTER — Inpatient Hospital Stay (HOSPITAL_COMMUNITY): Payer: Medicare Other

## 2018-07-17 ENCOUNTER — Inpatient Hospital Stay (HOSPITAL_COMMUNITY)
Admission: RE | Admit: 2018-07-17 | Discharge: 2018-07-23 | DRG: 460 | Disposition: A | Discharge status: Home Health | Payer: Medicare Other | Source: Ambulatory Visit | Attending: Neurological Surgery | Admitting: Neurological Surgery

## 2018-07-17 ENCOUNTER — Encounter (HOSPITAL_COMMUNITY)
Admission: RE | Disposition: A | Discharge status: Home Health | Payer: Self-pay | Source: Ambulatory Visit | Attending: Neurological Surgery

## 2018-07-17 ENCOUNTER — Inpatient Hospital Stay (HOSPITAL_COMMUNITY): Payer: Medicare Other | Admitting: Anesthesiology

## 2018-07-17 ENCOUNTER — Encounter (HOSPITAL_COMMUNITY): Payer: Self-pay | Admitting: Anesthesiology

## 2018-07-17 DIAGNOSIS — K219 Gastro-esophageal reflux disease without esophagitis: Secondary | ICD-10-CM | POA: Diagnosis present

## 2018-07-17 DIAGNOSIS — J449 Chronic obstructive pulmonary disease, unspecified: Secondary | ICD-10-CM | POA: Diagnosis present

## 2018-07-17 DIAGNOSIS — Z79899 Other long term (current) drug therapy: Secondary | ICD-10-CM

## 2018-07-17 DIAGNOSIS — M4726 Other spondylosis with radiculopathy, lumbar region: Secondary | ICD-10-CM | POA: Diagnosis present

## 2018-07-17 DIAGNOSIS — Z885 Allergy status to narcotic agent status: Secondary | ICD-10-CM

## 2018-07-17 DIAGNOSIS — E785 Hyperlipidemia, unspecified: Secondary | ICD-10-CM | POA: Diagnosis present

## 2018-07-17 DIAGNOSIS — I110 Hypertensive heart disease with heart failure: Secondary | ICD-10-CM | POA: Diagnosis present

## 2018-07-17 DIAGNOSIS — G4730 Sleep apnea, unspecified: Secondary | ICD-10-CM | POA: Diagnosis present

## 2018-07-17 DIAGNOSIS — Z923 Personal history of irradiation: Secondary | ICD-10-CM

## 2018-07-17 DIAGNOSIS — Z9221 Personal history of antineoplastic chemotherapy: Secondary | ICD-10-CM

## 2018-07-17 DIAGNOSIS — Z950 Presence of cardiac pacemaker: Secondary | ICD-10-CM | POA: Diagnosis not present

## 2018-07-17 DIAGNOSIS — E538 Deficiency of other specified B group vitamins: Secondary | ICD-10-CM | POA: Diagnosis present

## 2018-07-17 DIAGNOSIS — Z888 Allergy status to other drugs, medicaments and biological substances status: Secondary | ICD-10-CM

## 2018-07-17 DIAGNOSIS — F5104 Psychophysiologic insomnia: Secondary | ICD-10-CM | POA: Diagnosis present

## 2018-07-17 DIAGNOSIS — I5032 Chronic diastolic (congestive) heart failure: Secondary | ICD-10-CM | POA: Diagnosis present

## 2018-07-17 DIAGNOSIS — M8580 Other specified disorders of bone density and structure, unspecified site: Secondary | ICD-10-CM | POA: Diagnosis present

## 2018-07-17 DIAGNOSIS — Z7901 Long term (current) use of anticoagulants: Secondary | ICD-10-CM | POA: Diagnosis not present

## 2018-07-17 DIAGNOSIS — Z952 Presence of prosthetic heart valve: Secondary | ICD-10-CM | POA: Diagnosis not present

## 2018-07-17 DIAGNOSIS — Z981 Arthrodesis status: Secondary | ICD-10-CM

## 2018-07-17 DIAGNOSIS — H548 Legal blindness, as defined in USA: Secondary | ICD-10-CM | POA: Diagnosis present

## 2018-07-17 DIAGNOSIS — Z882 Allergy status to sulfonamides status: Secondary | ICD-10-CM

## 2018-07-17 DIAGNOSIS — M21371 Foot drop, right foot: Secondary | ICD-10-CM | POA: Diagnosis present

## 2018-07-17 DIAGNOSIS — M48062 Spinal stenosis, lumbar region with neurogenic claudication: Principal | ICD-10-CM | POA: Diagnosis present

## 2018-07-17 DIAGNOSIS — Z87891 Personal history of nicotine dependence: Secondary | ICD-10-CM

## 2018-07-17 DIAGNOSIS — Z85048 Personal history of other malignant neoplasm of rectum, rectosigmoid junction, and anus: Secondary | ICD-10-CM

## 2018-07-17 DIAGNOSIS — I050 Rheumatic mitral stenosis: Secondary | ICD-10-CM | POA: Diagnosis present

## 2018-07-17 DIAGNOSIS — Z7951 Long term (current) use of inhaled steroids: Secondary | ICD-10-CM

## 2018-07-17 DIAGNOSIS — Z8619 Personal history of other infectious and parasitic diseases: Secondary | ICD-10-CM | POA: Diagnosis not present

## 2018-07-17 DIAGNOSIS — Z419 Encounter for procedure for purposes other than remedying health state, unspecified: Secondary | ICD-10-CM

## 2018-07-17 HISTORY — DX: Other spondylosis with radiculopathy, lumbar region: M47.26

## 2018-07-17 HISTORY — DX: Presence of cardiac pacemaker: Z95.0

## 2018-07-17 HISTORY — PX: ANTERIOR LAT LUMBAR FUSION: SHX1168

## 2018-07-17 LAB — PROTIME-INR
INR: 1.21
Prothrombin Time: 15.2 seconds (ref 11.4–15.2)

## 2018-07-17 SURGERY — ANTERIOR LATERAL LUMBAR FUSION 2 LEVELS
Anesthesia: General | Site: Spine Lumbar | Laterality: Left

## 2018-07-17 MED ORDER — MENTHOL 3 MG MT LOZG: 1.0000 | LOZENGE | OROMUCOSAL | Status: DC | PRN

## 2018-07-17 MED ORDER — FENTANYL CITRATE (PF) 250 MCG/5ML IJ SOLN
INTRAMUSCULAR | Status: AC
  Filled 2018-07-17: qty 5

## 2018-07-17 MED ORDER — ROCURONIUM BROMIDE 50 MG/5ML IV SOSY
PREFILLED_SYRINGE | INTRAVENOUS | Status: AC
  Filled 2018-07-17: qty 5

## 2018-07-17 MED ORDER — PHENYLEPHRINE 40 MCG/ML (10ML) SYRINGE FOR IV PUSH (FOR BLOOD PRESSURE SUPPORT)
PREFILLED_SYRINGE | INTRAVENOUS | Status: AC
  Filled 2018-07-17: qty 10

## 2018-07-17 MED ORDER — BUPIVACAINE HCL (PF) 0.5 % IJ SOLN
INTRAMUSCULAR | Status: AC
  Filled 2018-07-17: qty 30

## 2018-07-17 MED ORDER — METHOCARBAMOL 1000 MG/10ML IJ SOLN
500.0000 mg | Freq: Four times a day (QID) | INTRAVENOUS | Status: DC | PRN
  Filled 2018-07-17: qty 5

## 2018-07-17 MED ORDER — LIDOCAINE-EPINEPHRINE 1 %-1:100000 IJ SOLN
INTRAMUSCULAR | Status: DC | PRN
  Administered 2018-07-17: 5 mL

## 2018-07-17 MED ORDER — ACETAMINOPHEN 325 MG PO TABS
650.0000 mg | ORAL_TABLET | ORAL | Status: DC | PRN
  Administered 2018-07-20 – 2018-07-23 (×8): 650 mg via ORAL
  Filled 2018-07-17 (×7): qty 2

## 2018-07-17 MED ORDER — MEPERIDINE HCL 50 MG/ML IJ SOLN: 6.2500 mg | INTRAMUSCULAR | Status: DC | PRN

## 2018-07-17 MED ORDER — HYDROCODONE-ACETAMINOPHEN 5-325 MG PO TABS
ORAL_TABLET | ORAL | Status: AC
  Filled 2018-07-17: qty 2

## 2018-07-17 MED ORDER — SUCCINYLCHOLINE CHLORIDE 200 MG/10ML IV SOSY
PREFILLED_SYRINGE | INTRAVENOUS | Status: AC
  Filled 2018-07-17: qty 10

## 2018-07-17 MED ORDER — PROPOFOL 10 MG/ML IV BOLUS
INTRAVENOUS | Status: AC
  Filled 2018-07-17: qty 20

## 2018-07-17 MED ORDER — LACTATED RINGERS IV SOLN
INTRAVENOUS | Status: DC | PRN
  Administered 2018-07-17: 07:00:00 via INTRAVENOUS

## 2018-07-17 MED ORDER — DOCUSATE SODIUM 100 MG PO CAPS
100.0000 mg | ORAL_CAPSULE | Freq: Two times a day (BID) | ORAL | Status: DC
  Administered 2018-07-17 – 2018-07-22 (×12): 100 mg via ORAL
  Filled 2018-07-17 (×13): qty 1

## 2018-07-17 MED ORDER — POLYETHYLENE GLYCOL 3350 17 G PO PACK
17.0000 g | PACK | Freq: Every day | ORAL | Status: DC
  Administered 2018-07-18 – 2018-07-22 (×5): 17 g via ORAL
  Filled 2018-07-17 (×6): qty 1

## 2018-07-17 MED ORDER — ONDANSETRON HCL 4 MG PO TABS
4.0000 mg | ORAL_TABLET | Freq: Four times a day (QID) | ORAL | Status: DC | PRN
  Administered 2018-07-19: 4 mg via ORAL
  Filled 2018-07-17: qty 1

## 2018-07-17 MED ORDER — THROMBIN 5000 UNITS EX SOLR
OROMUCOSAL | Status: DC | PRN
  Administered 2018-07-17: 09:00:00

## 2018-07-17 MED ORDER — LIDOCAINE-EPINEPHRINE 1 %-1:100000 IJ SOLN
INTRAMUSCULAR | Status: AC
  Filled 2018-07-17: qty 1

## 2018-07-17 MED ORDER — CEFAZOLIN SODIUM-DEXTROSE 2-4 GM/100ML-% IV SOLN
2.0000 g | INTRAVENOUS | Status: AC
  Administered 2018-07-17: 2 g via INTRAVENOUS
  Filled 2018-07-17: qty 100

## 2018-07-17 MED ORDER — CEFAZOLIN SODIUM-DEXTROSE 2-4 GM/100ML-% IV SOLN
2.0000 g | Freq: Three times a day (TID) | INTRAVENOUS | Status: AC
  Administered 2018-07-17 (×2): 2 g via INTRAVENOUS
  Filled 2018-07-17 (×2): qty 100

## 2018-07-17 MED ORDER — SODIUM CHLORIDE 0.9 % IV SOLN
INTRAVENOUS | Status: DC | PRN
  Administered 2018-07-17: 500 mL

## 2018-07-17 MED ORDER — LACTATED RINGERS IV SOLN
INTRAVENOUS | Status: DC | PRN
  Administered 2018-07-17 (×2): via INTRAVENOUS

## 2018-07-17 MED ORDER — PROPOFOL 500 MG/50ML IV EMUL
INTRAVENOUS | Status: DC | PRN
  Administered 2018-07-17: 100 ug/kg/min via INTRAVENOUS

## 2018-07-17 MED ORDER — HYDROMORPHONE HCL 1 MG/ML IJ SOLN
0.2500 mg | INTRAMUSCULAR | Status: DC | PRN
  Administered 2018-07-17 (×2): 0.25 mg via INTRAVENOUS

## 2018-07-17 MED ORDER — PRESERVISION AREDS 2 PO CAPS: ORAL_CAPSULE | Freq: Every day | ORAL | Status: DC

## 2018-07-17 MED ORDER — TIOTROPIUM BROMIDE MONOHYDRATE 18 MCG IN CAPS
18.0000 ug | ORAL_CAPSULE | Freq: Every day | RESPIRATORY_TRACT | Status: DC
  Filled 2018-07-17: qty 5

## 2018-07-17 MED ORDER — ONDANSETRON HCL 4 MG/2ML IJ SOLN: 4.0000 mg | Freq: Four times a day (QID) | INTRAMUSCULAR | Status: DC | PRN

## 2018-07-17 MED ORDER — ALBUTEROL SULFATE (2.5 MG/3ML) 0.083% IN NEBU: 3.0000 mL | INHALATION_SOLUTION | Freq: Four times a day (QID) | RESPIRATORY_TRACT | Status: DC | PRN

## 2018-07-17 MED ORDER — SODIUM CHLORIDE 0.9 % IV SOLN
INTRAVENOUS | Status: DC | PRN
  Administered 2018-07-17: 50 ug/min via INTRAVENOUS

## 2018-07-17 MED ORDER — LIDOCAINE 2% (20 MG/ML) 5 ML SYRINGE
INTRAMUSCULAR | Status: AC
  Filled 2018-07-17: qty 5

## 2018-07-17 MED ORDER — HYDROCODONE-ACETAMINOPHEN 5-325 MG PO TABS
1.0000 | ORAL_TABLET | ORAL | Status: DC | PRN
  Administered 2018-07-17 – 2018-07-19 (×10): 2 via ORAL
  Administered 2018-07-19: 1 via ORAL
  Administered 2018-07-20: 2 via ORAL
  Administered 2018-07-20 (×2): 1 via ORAL
  Administered 2018-07-21: 2 via ORAL
  Administered 2018-07-21: 1 via ORAL
  Administered 2018-07-22 – 2018-07-23 (×4): 2 via ORAL
  Filled 2018-07-17: qty 1
  Filled 2018-07-17 (×3): qty 2
  Filled 2018-07-17: qty 1
  Filled 2018-07-17 (×4): qty 2
  Filled 2018-07-17: qty 1
  Filled 2018-07-17 (×2): qty 2
  Filled 2018-07-17: qty 1
  Filled 2018-07-17 (×6): qty 2

## 2018-07-17 MED ORDER — TRAMADOL HCL 50 MG PO TABS
25.0000 mg | ORAL_TABLET | Freq: Two times a day (BID) | ORAL | Status: DC | PRN
  Administered 2018-07-19: 25 mg via ORAL
  Filled 2018-07-17: qty 1

## 2018-07-17 MED ORDER — LIDOCAINE 2% (20 MG/ML) 5 ML SYRINGE
INTRAMUSCULAR | Status: DC | PRN
  Administered 2018-07-17: 20 mg via INTRAVENOUS

## 2018-07-17 MED ORDER — MIDAZOLAM HCL 5 MG/5ML IJ SOLN
INTRAMUSCULAR | Status: DC | PRN
  Administered 2018-07-17 (×2): 1 mg via INTRAVENOUS

## 2018-07-17 MED ORDER — DEXAMETHASONE SODIUM PHOSPHATE 10 MG/ML IJ SOLN
INTRAMUSCULAR | Status: DC | PRN
  Administered 2018-07-17: 10 mg via INTRAVENOUS

## 2018-07-17 MED ORDER — THROMBIN 5000 UNITS EX SOLR
CUTANEOUS | Status: AC
  Filled 2018-07-17: qty 5000

## 2018-07-17 MED ORDER — ACETAMINOPHEN 500 MG PO TABS
1000.0000 mg | ORAL_TABLET | Freq: Once | ORAL | Status: AC
  Administered 2018-07-17: 1000 mg via ORAL

## 2018-07-17 MED ORDER — METHOCARBAMOL 500 MG PO TABS
ORAL_TABLET | ORAL | Status: AC
  Filled 2018-07-17: qty 1

## 2018-07-17 MED ORDER — ONDANSETRON HCL 4 MG/2ML IJ SOLN
INTRAMUSCULAR | Status: DC | PRN
  Administered 2018-07-17: 4 mg via INTRAVENOUS

## 2018-07-17 MED ORDER — ACETAMINOPHEN 650 MG RE SUPP: 650.0000 mg | RECTAL | Status: DC | PRN

## 2018-07-17 MED ORDER — SCOPOLAMINE 1 MG/3DAYS TD PT72
MEDICATED_PATCH | TRANSDERMAL | Status: AC
  Administered 2018-07-17: 1.5 mg
  Filled 2018-07-17: qty 1

## 2018-07-17 MED ORDER — PROMETHAZINE HCL 25 MG/ML IJ SOLN: 6.2500 mg | INTRAMUSCULAR | Status: DC | PRN

## 2018-07-17 MED ORDER — 0.9 % SODIUM CHLORIDE (POUR BTL) OPTIME
TOPICAL | Status: DC | PRN
  Administered 2018-07-17: 1000 mL

## 2018-07-17 MED ORDER — ALBUMIN HUMAN 5 % IV SOLN
INTRAVENOUS | Status: AC
  Filled 2018-07-17: qty 250

## 2018-07-17 MED ORDER — SCOPOLAMINE 1 MG/3DAYS TD PT72: 1.0000 | MEDICATED_PATCH | TRANSDERMAL | Status: DC

## 2018-07-17 MED ORDER — FLUTICASONE FUROATE-VILANTEROL 200-25 MCG/INH IN AEPB
1.0000 | INHALATION_SPRAY | Freq: Every day | RESPIRATORY_TRACT | Status: DC
  Administered 2018-07-18 – 2018-07-21 (×3): 1 via RESPIRATORY_TRACT
  Filled 2018-07-17: qty 28

## 2018-07-17 MED ORDER — IPRATROPIUM-ALBUTEROL 0.5-2.5 (3) MG/3ML IN SOLN: 3.0000 mL | Freq: Four times a day (QID) | RESPIRATORY_TRACT | Status: DC | PRN

## 2018-07-17 MED ORDER — CHLORHEXIDINE GLUCONATE CLOTH 2 % EX PADS: 6.0000 | MEDICATED_PAD | Freq: Once | CUTANEOUS | Status: DC

## 2018-07-17 MED ORDER — BUPIVACAINE HCL (PF) 0.5 % IJ SOLN
INTRAMUSCULAR | Status: DC | PRN
  Administered 2018-07-17: 5 mL

## 2018-07-17 MED ORDER — ALUM & MAG HYDROXIDE-SIMETH 200-200-20 MG/5ML PO SUSP: 30.0000 mL | Freq: Four times a day (QID) | ORAL | Status: DC | PRN

## 2018-07-17 MED ORDER — SUCCINYLCHOLINE CHLORIDE 20 MG/ML IJ SOLN
INTRAMUSCULAR | Status: DC | PRN
  Administered 2018-07-17: 100 mg via INTRAVENOUS

## 2018-07-17 MED ORDER — UMECLIDINIUM BROMIDE 62.5 MCG/INH IN AEPB
1.0000 | INHALATION_SPRAY | Freq: Every day | RESPIRATORY_TRACT | Status: DC
  Administered 2018-07-18 – 2018-07-21 (×3): 1 via RESPIRATORY_TRACT
  Filled 2018-07-17: qty 7

## 2018-07-17 MED ORDER — ONDANSETRON HCL 4 MG/2ML IJ SOLN
INTRAMUSCULAR | Status: AC
  Filled 2018-07-17: qty 2

## 2018-07-17 MED ORDER — FENTANYL CITRATE (PF) 250 MCG/5ML IJ SOLN
INTRAMUSCULAR | Status: DC | PRN
  Administered 2018-07-17: 50 ug via INTRAVENOUS
  Administered 2018-07-17: 250 ug via INTRAVENOUS

## 2018-07-17 MED ORDER — BISACODYL 10 MG RE SUPP
10.0000 mg | Freq: Every day | RECTAL | Status: DC | PRN
  Administered 2018-07-21: 10 mg via RECTAL
  Filled 2018-07-17: qty 1

## 2018-07-17 MED ORDER — PROPOFOL 10 MG/ML IV BOLUS
INTRAVENOUS | Status: DC | PRN
  Administered 2018-07-17: 90 mg via INTRAVENOUS

## 2018-07-17 MED ORDER — MIDAZOLAM HCL 2 MG/2ML IJ SOLN: 0.5000 mg | Freq: Once | INTRAMUSCULAR | Status: DC | PRN

## 2018-07-17 MED ORDER — POLYETHYLENE GLYCOL 3350 17 G PO PACK
17.0000 g | PACK | Freq: Every day | ORAL | Status: DC | PRN
  Filled 2018-07-17: qty 1

## 2018-07-17 MED ORDER — FLEET ENEMA 7-19 GM/118ML RE ENEM: 1.0000 | ENEMA | Freq: Once | RECTAL | Status: DC | PRN

## 2018-07-17 MED ORDER — ACETAMINOPHEN 500 MG PO TABS
ORAL_TABLET | ORAL | Status: AC
  Filled 2018-07-17: qty 2

## 2018-07-17 MED ORDER — SODIUM CHLORIDE 0.9% FLUSH
3.0000 mL | INTRAVENOUS | Status: DC | PRN
  Administered 2018-07-21: 3 mL via INTRAVENOUS
  Filled 2018-07-17: qty 3

## 2018-07-17 MED ORDER — MONTELUKAST SODIUM 10 MG PO TABS
10.0000 mg | ORAL_TABLET | Freq: Every day | ORAL | Status: DC
  Administered 2018-07-17 – 2018-07-22 (×6): 10 mg via ORAL
  Filled 2018-07-17 (×6): qty 1

## 2018-07-17 MED ORDER — POLYVINYL ALCOHOL 1.4 % OP SOLN
1.0000 [drp] | Freq: Two times a day (BID) | OPHTHALMIC | Status: DC | PRN
  Administered 2018-07-20: 1 [drp] via OPHTHALMIC
  Filled 2018-07-17: qty 15

## 2018-07-17 MED ORDER — SODIUM CHLORIDE 0.9% FLUSH
3.0000 mL | Freq: Two times a day (BID) | INTRAVENOUS | Status: DC
  Administered 2018-07-17 – 2018-07-23 (×11): 3 mL via INTRAVENOUS

## 2018-07-17 MED ORDER — HYDROMORPHONE HCL 1 MG/ML IJ SOLN
INTRAMUSCULAR | Status: AC
  Filled 2018-07-17: qty 1

## 2018-07-17 MED ORDER — PHENOL 1.4 % MT LIQD: 1.0000 | OROMUCOSAL | Status: DC | PRN

## 2018-07-17 MED ORDER — PHENYLEPHRINE 40 MCG/ML (10ML) SYRINGE FOR IV PUSH (FOR BLOOD PRESSURE SUPPORT)
PREFILLED_SYRINGE | INTRAVENOUS | Status: DC | PRN
  Administered 2018-07-17: 80 ug via INTRAVENOUS

## 2018-07-17 MED ORDER — MORPHINE SULFATE (PF) 2 MG/ML IV SOLN
2.0000 mg | INTRAVENOUS | Status: DC | PRN
  Administered 2018-07-17 – 2018-07-20 (×2): 2 mg via INTRAVENOUS
  Filled 2018-07-17: qty 1

## 2018-07-17 MED ORDER — DEXAMETHASONE SODIUM PHOSPHATE 10 MG/ML IJ SOLN
INTRAMUSCULAR | Status: AC
  Filled 2018-07-17: qty 1

## 2018-07-17 MED ORDER — METHOCARBAMOL 500 MG PO TABS
500.0000 mg | ORAL_TABLET | Freq: Four times a day (QID) | ORAL | Status: DC | PRN
  Administered 2018-07-17 – 2018-07-21 (×7): 500 mg via ORAL
  Filled 2018-07-17 (×6): qty 1

## 2018-07-17 MED ORDER — PRAVASTATIN SODIUM 40 MG PO TABS
40.0000 mg | ORAL_TABLET | Freq: Every day | ORAL | Status: DC
  Administered 2018-07-17 – 2018-07-22 (×6): 40 mg via ORAL
  Filled 2018-07-17 (×6): qty 1

## 2018-07-17 MED ORDER — SENNA 8.6 MG PO TABS
1.0000 | ORAL_TABLET | Freq: Two times a day (BID) | ORAL | Status: DC
  Administered 2018-07-17 – 2018-07-22 (×11): 8.6 mg via ORAL
  Filled 2018-07-17 (×12): qty 1

## 2018-07-17 MED ORDER — PROSIGHT PO TABS
1.0000 | ORAL_TABLET | Freq: Every day | ORAL | Status: DC
  Administered 2018-07-17 – 2018-07-22 (×6): 1 via ORAL
  Filled 2018-07-17 (×7): qty 1

## 2018-07-17 MED ORDER — CALCIUM POLYCARBOPHIL 625 MG PO TABS
625.0000 mg | ORAL_TABLET | Freq: Every day | ORAL | Status: DC
  Administered 2018-07-17: 17:00:00 via ORAL
  Administered 2018-07-18 – 2018-07-22 (×5): 625 mg via ORAL
  Filled 2018-07-17 (×7): qty 1

## 2018-07-17 MED ORDER — ALBUMIN HUMAN 5 % IV SOLN
12.5000 g | Freq: Once | INTRAVENOUS | Status: AC
  Administered 2018-07-17: 12.5 g via INTRAVENOUS

## 2018-07-17 MED ORDER — FESOTERODINE FUMARATE ER 8 MG PO TB24
8.0000 mg | ORAL_TABLET | Freq: Every day | ORAL | Status: DC
  Administered 2018-07-17 – 2018-07-22 (×6): 8 mg via ORAL
  Filled 2018-07-17 (×7): qty 1

## 2018-07-17 MED ORDER — MIDAZOLAM HCL 2 MG/2ML IJ SOLN
INTRAMUSCULAR | Status: AC
  Filled 2018-07-17: qty 2

## 2018-07-17 MED ORDER — POLYVINYL ALCOHOL-POVIDONE PF 1.4-0.6 % OP SOLN: Freq: Two times a day (BID) | OPHTHALMIC | Status: DC | PRN

## 2018-07-17 MED ORDER — LACTATED RINGERS IV SOLN
INTRAVENOUS | Status: DC
  Administered 2018-07-17: 16:00:00 via INTRAVENOUS

## 2018-07-17 SURGICAL SUPPLY — 46 items
BLADE CLIPPER SURG (BLADE) IMPLANT
BOLT PLATE XLIF 5.5X55 LRG (Bolt) ×2 IMPLANT
COVER WAND RF STERILE (DRAPES) ×2 IMPLANT
DERMABOND ADVANCED (GAUZE/BANDAGES/DRESSINGS) ×1
DERMABOND ADVANCED .7 DNX12 (GAUZE/BANDAGES/DRESSINGS) ×1 IMPLANT
DRAPE C-ARM 42X72 X-RAY (DRAPES) ×2 IMPLANT
DRAPE C-ARMOR (DRAPES) ×2 IMPLANT
DRAPE LAPAROTOMY 100X72X124 (DRAPES) ×2 IMPLANT
DURAPREP 26ML APPLICATOR (WOUND CARE) ×2 IMPLANT
ELECT REM PT RETURN 9FT ADLT (ELECTROSURGICAL) ×2
ELECTRODE REM PT RTRN 9FT ADLT (ELECTROSURGICAL) ×1 IMPLANT
GAUZE 4X4 16PLY RFD (DISPOSABLE) IMPLANT
GLOVE BIO SURGEON STRL SZ7.5 (GLOVE) ×2 IMPLANT
GLOVE BIOGEL PI IND STRL 7.5 (GLOVE) ×1 IMPLANT
GLOVE BIOGEL PI IND STRL 8 (GLOVE) ×1 IMPLANT
GLOVE BIOGEL PI IND STRL 8.5 (GLOVE) ×1 IMPLANT
GLOVE BIOGEL PI INDICATOR 7.5 (GLOVE) ×1
GLOVE BIOGEL PI INDICATOR 8 (GLOVE) ×1
GLOVE BIOGEL PI INDICATOR 8.5 (GLOVE) ×1
GLOVE ECLIPSE 8.5 STRL (GLOVE) ×2 IMPLANT
GLOVE SS N UNI LF 7.5 STRL (GLOVE) ×12 IMPLANT
GOWN STRL REUS W/ TWL LRG LVL3 (GOWN DISPOSABLE) ×3 IMPLANT
GOWN STRL REUS W/ TWL XL LVL3 (GOWN DISPOSABLE) ×1 IMPLANT
GOWN STRL REUS W/TWL 2XL LVL3 (GOWN DISPOSABLE) ×2 IMPLANT
GOWN STRL REUS W/TWL LRG LVL3 (GOWN DISPOSABLE) ×3
GOWN STRL REUS W/TWL XL LVL3 (GOWN DISPOSABLE) ×1
KIT BASIN OR (CUSTOM PROCEDURE TRAY) ×2 IMPLANT
KIT DILATOR XLIF 5 (KITS) ×2 IMPLANT
KIT INFUSE X SMALL 1.4CC (Orthopedic Implant) ×2 IMPLANT
KIT SURGICAL ACCESS MAXCESS 4 (KITS) ×2 IMPLANT
KIT TURNOVER KIT B (KITS) ×2 IMPLANT
MODULE NVM5 NEXT GEN EMG (NEEDLE) ×2 IMPLANT
MODULUS XLW 12X22X55MM 10 (Spine Construct) ×4 IMPLANT
NEEDLE HYPO 25X1 1.5 SAFETY (NEEDLE) ×2 IMPLANT
NS IRRIG 1000ML POUR BTL (IV SOLUTION) ×2 IMPLANT
PACK LAMINECTOMY NEURO (CUSTOM PROCEDURE TRAY) ×2 IMPLANT
PLATE DECADE XLIP 2H SZ12 (Plate) ×4 IMPLANT
PUTTY BONE ATTRAX 10CC STRIP (Putty) ×2 IMPLANT
SCREW DECADE 5.5X50 (Screw) ×6 IMPLANT
SPONGE LAP 4X18 RFD (DISPOSABLE) IMPLANT
SUT VIC AB 2-0 CP2 18 (SUTURE) ×4 IMPLANT
SUT VIC AB 3-0 SH 8-18 (SUTURE) ×4 IMPLANT
TOWEL GREEN STERILE (TOWEL DISPOSABLE) ×2 IMPLANT
TOWEL GREEN STERILE FF (TOWEL DISPOSABLE) ×2 IMPLANT
TRAY FOLEY MTR SLVR 16FR STAT (SET/KITS/TRAYS/PACK) ×2 IMPLANT
WATER STERILE IRR 1000ML POUR (IV SOLUTION) ×2 IMPLANT

## 2018-07-17 NOTE — Anesthesia Procedure Notes (Signed)
Procedure Name: Intubation Date/Time: 07/17/2018 7:52 AM Performed by: Renato Shin, CRNA Pre-anesthesia Checklist: Patient identified, Emergency Drugs available, Suction available and Patient being monitored Patient Re-evaluated:Patient Re-evaluated prior to induction Oxygen Delivery Method: Circle system utilized Preoxygenation: Pre-oxygenation with 100% oxygen Induction Type: IV induction Ventilation: Mask ventilation without difficulty Laryngoscope Size: Miller and 2 Grade View: Grade I Tube type: Oral Tube size: 7.0 mm Number of attempts: 1 Airway Equipment and Method: Stylet Placement Confirmation: ETT inserted through vocal cords under direct vision,  positive ETCO2,  CO2 detector and breath sounds checked- equal and bilateral Secured at: 22 cm Tube secured with: Tape Dental Injury: Teeth and Oropharynx as per pre-operative assessment

## 2018-07-17 NOTE — Progress Notes (Signed)
Patient ID: [name], female   DOB: 01-Jan-1946, 72 y.o.   MRN: [id number] Vital signs are stable Doing well postoperatively Complains of back soreness Incisions clean dry

## 2018-07-17 NOTE — Transfer of Care (Signed)
Immediate Anesthesia Transfer of Care Note  Patient: [name]  Procedure(s) Performed: Lumbar Two-Three Lumbar Three-Four Anterolateral decompression/interbody fusion with lateral plate fixation/Infuse (Left Spine Lumbar)  Patient Location: PACU  Anesthesia Type:General  Level of Consciousness: awake, drowsy and patient cooperative  Airway & Oxygen Therapy: Patient Spontanous Breathing and Patient connected to face mask oxygen  Post-op Assessment: Report given to RN, Post -op Vital signs reviewed and stable and Patient moving all extremities X 4  Post vital signs: Reviewed and stable  Last Vitals:  Vitals Value Taken Time  BP 106/53 07/17/2018 10:35 AM  Temp    Pulse 69 07/17/2018 10:38 AM  Resp 21 07/17/2018 10:38 AM  SpO2 100 % 07/17/2018 10:38 AM  Vitals shown include unvalidated device data.  Last Pain:  Vitals:   07/17/18 0648  PainSc: 6       Patients Stated Pain Goal: 5 (12/02/34 1224)  Complications: No apparent anesthesia complications

## 2018-07-17 NOTE — Op Note (Signed)
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  Lateral plate fixation U7-6 L4-6.  Neuro monitoring.  Fluoroscopic imaging. Surgeon: Kristeen Miss First assistant: Deatra Ina Anesthesia: General endotracheal Indications: Injured [name] is a 72 year old individuals had significant back pain and leg pain and she is developed significant weakness in the left lower extremity with foot drop.  She is advised regarding the need for surgical decompression using an indirect technique.  Procedure: Patient was brought to the operating room supine on the stretcher.  After the smooth induction of general endotracheal anesthesia monitoring electrodes were placed in the major groups of muscles of the lower extremities.  She was then carefully turned into the right lateral decubitus position and orthogonal LT was checked with a number of radiographs.  The patient was taped into position with the bony prominences appropriately padded and protected.  The lateral aspect of the abdomen and lumbar spine were then prepped with alcohol DuraPrep and draped in a sterile fashion.  Fluoroscopic imaging was used to place an incision between the L2-3 and L3-4 spaces on the left side.  A posterior incision was created in the flank.  Then after the incisions were made by blunt palpation through the fascia the posterior finger was entered into the retroperitoneal space.  A probe was then passed through the lateral incision overlying the L3-4 interspace.  The probe was guided through the psoas muscle over the disc space at L3-4.  Neural monitoring was performed to make sure that no elements of the upper lumbar plexus were involved.  When verified a K wire was passed into the disc space at L3-4.  A series of dilators were then passed over this all the time  performing neuro monitoring.  Lateral retractor was placed and secured to the operating table with a clamp and orthogonal imaging was obtained again.  Then by spreading the retractor shim could be placed into the interspace at the posterior aspect of the dissection once no neural elements were identified in this area.  The disc space was then entered and a discectomy was performed using a combination of curettes and rongeurs to evacuate substantial quantities of severely degenerated disc material.  The disc space was then enlarged by a series of dilators and ultimately was felt that a 12 mm tall 55 mm wide 22 mm long spacer with 10 degrees of lordosis would fit into the L3-4 space lateral osteophytes were removed from the superior margin body of L4 and inferior margin of the body of L3.  Lateral plate measuring 12 mm in height was then secured with a 55 mm screw and L5 for a 50 mm screws in L3.  Attention was then turned to L2-3 where the same procedure was repeated here the same size spacer was placed in the lateral aspect and the similar plate with similar length screws was used to secure the L2-3 space.  Neuro monitoring revealed no irritability of any of the lumbar nerves.  In the end the retractors were removed and the fascia was closed with 2-0 Vicryl interrupted fashion 3-0 Vicryl was used subcuticularly in both incisions.  Blood loss was estimated at approximately 100 cc for the entire procedure.  Patient tolerated procedure well is returned to recovery room in stable condition.

## 2018-07-17 NOTE — H&P (Signed)
[name] is an 72 y.o. female.   Chief Complaint: Back and bilateral leg pain with weakness or limitation in ambulatory status. HPI: [name] is a 72 year old individual who had a degenerative spondylolisthesis.  In 2007 she underwent decompression and fusion at L4-L5.  She notes that she did quite well until about the past years time when she started to develop increasing back pain and leg pain she then developed weakness with a foot drop on the right side she has had weakness on the left leg and her ambulatory status has diminished substantially.  Recent MRI demonstrates that she has advanced spondylitic stenosis at L2-3 and L3-4 2 levels above her fusion.  After careful consideration of her options and attempts at conservative management she is advised regarding the need for surgical decompression and stabilization.  She has been on blood thinner in the form of Xarelto.  This is been stopped in preparation for the surgery and is now admitted to undergo two-level anterolateral decompression using an ex lift technique with lateral plate fixation.  Past Medical History:  Diagnosis Date  . Acute cystitis without hematuria 12/22/2015  . Anemia   . Arthritis   . CAP (community acquired pneumonia) 02/08/2016  . Cataracts, bilateral    immature  . Chronic diastolic heart failure ([place]) 09/19/2014  . Chronic insomnia   . Constipation    takes Miralax daily as needed  . COPD (chronic obstructive pulmonary disease) (HCC)    Albuterol inhaler daily as needed;Duoneb daily as needed;Spiriva daily  . DDD (degenerative disc disease)    cervical - kyphosis with mod DD changes C5/6 and C6/7; lumbar - early DD at L2/3 ([facility])  . Dyspnea    with exertion  . Essential hypertension    was on meds but after appointment Dec 11 with Dr.C he took her off  . GERD (gastroesophageal reflux disease)    takes Protonix daily  . History of pneumonia 02/16/2016  . History of radiation therapy 05/30/11 to  07/07/11   rectum  . History of shingles   . Hyperlipemia    takes Lovastatin daily  . Legally blind in left eye, as defined in [place]   . Lung nodule    RLL nodule-69mm stable 2006, April 2009, and June 2009  . Obesity   . Osteopenia 12/2014   T -1.5 hip  . Pancreatitis 11/2014   ?zpack related vs gallstone pancreatitis with abnormal HIDA scan pending cholecystectomy  . PONV (postoperative nausea and vomiting)   . Presence of permanent cardiac pacemaker   . Rectal cancer ([place]) 04/2011   T3N0; s/p lap LAR, s/p ileostomy, s/p reversal, s/p chemo  . Research study patient 08/26/2016  . Rheumatic heart disease mitral stenosis    mod MS by echo 02/2014  . S/P AVR (aortic valve replacement) 2015   bioprosthetic ([place])  . S/P MVR (mitral valve replacement) 2015   bioprosthetic ([place])  . Seizures ([place])    febrile seizures as a child  . Sepsis secondary to UTI ([place]) 08/22/2017  . Severe aortic valve stenosis  AVR 3/15 10/01/2013   mild-mod by echo 02/2014  . Sleep apnea    PT TOLD BORDERLINE-TRIED CPAP-DID NOT HELP-SHE DOES NOT USE CPAP  . Small bowel obstruction, partial ([place]) 08/2013   reolved without NGT placement.   . Vitamin B12 deficiency 02/28/2015   Start B12 shots 02/2015     Past Surgical History:  Procedure Laterality Date  . AORTIC VALVE REPLACEMENT N/A 12/12/2013  Procedure: AORTIC VALVE REPLACEMENT (AVR);  Surgeon: Gaye Pollack, MD; Service: Open Heart Surgery  . BACK SURGERY  2006, 2007   2006 SPACER, 2007 decompression and fusion L4/5  . BOWEL RESECTION  04/02/2012   Procedure: SMALL BOWEL RESECTION;  Surgeon: Stark Klein, MD;  Location: [facility];  Service: General;  Laterality: N/A;  . CARPAL TUNNEL RELEASE Bilateral   . CATARACT EXTRACTION W/ INTRAOCULAR LENS IMPLANT Left 10/18/2016  . CATARACT EXTRACTION W/ INTRAOCULAR LENS IMPLANT Right 12/13/2016   Dr. Kathrin Penner  . CHOLECYSTECTOMY N/A 01/21/2015   chronic cholecystitis, Stark Klein, MD  . COLON RESECTION   08/25/2011   Procedure: COLON RESECTION LAPAROSCOPIC;  Surgeon: Stark Klein, MD;  Location: [facility];  Service: General;  Laterality: N/A;  Laparoscopic Assisted Low Anterior Resection Diverting Ostomy and onQ pain pump  . COLONOSCOPY  03/2013   1 polyp, rpt 3 yrs Ardis Hughs)  . COLONOSCOPY WITH PROPOFOL N/A 06/09/2016   patent colo-colonic anastomosis, rpt 5 yrs Ardis Hughs)  . EP IMPLANTABLE DEVICE N/A 06/16/2016   Procedure: Pacemaker Implant;  Surgeon: Will Meredith Leeds, MD;  Location: [facility];  Service: Cardiovascular;  Laterality: N/A;  . FOOT SURGERY  left foot   hammer toe  . HAND SURGERY  Bil   carpal tunnel  . ILEOSTOMY  08/25/2011  . ILEOSTOMY CLOSURE  04/02/2012   Procedure: ILEOSTOMY TAKEDOWN;  Surgeon: Stark Klein, MD;  Location: [facility];  Service: General;  Laterality: N/A;  . INTRAOPERATIVE TRANSESOPHAGEAL ECHOCARDIOGRAM N/A 12/12/2013   Procedure: INTRAOPERATIVE TRANSESOPHAGEAL ECHOCARDIOGRAM;  Surgeon: Gaye Pollack, MD;  Location: [facility];  Service: Open Heart Surgery;  Laterality: N/A;  . LEFT AND RIGHT HEART CATHETERIZATION WITH CORONARY ANGIOGRAM N/A 11/27/2013   Procedure: LEFT AND RIGHT HEART CATHETERIZATION WITH CORONARY ANGIOGRAM;  Surgeon: Sanda Klein, MD;  Location: [facility];  Service: Cardiovascular;  Laterality: N/A;  . MITRAL VALVE REPLACEMENT N/A 12/12/2013   Procedure: MITRAL VALVE (MV) REPLACEMENT OR REPAIR;  Surgeon: Gaye Pollack, MD; Service: Open Heart Surgery  . TEE WITHOUT CARDIOVERSION N/A 11/29/2013   Procedure: TRANSESOPHAGEAL ECHOCARDIOGRAM (TEE);  Surgeon: Sueanne Margarita, MD;  Location: [facility];  Service: Cardiovascular;  Laterality: N/A;  . TONSILLECTOMY      Family History  Problem Relation Age of Onset  . Lung cancer Father   . Heart disease Mother   . Diabetes Mother   . Hypertension Mother   . Stroke Mother   . CAD Son 68  . Heart attack Son 53  . Heart attack Maternal Grandfather   . Breast cancer Maternal Aunt 83  . Colon  cancer Neg Hx   . Esophageal cancer Neg Hx   . Stomach cancer Neg Hx   . Rectal cancer Neg Hx    Social History:  reports that she quit smoking about 43 years ago. Her smoking use included cigarettes. She has a 15.00 pack-year smoking history. She has never used smokeless tobacco. She reports that she drank about 2.0 standard drinks of alcohol per week. She reports that she does not use drugs.  Allergies:  Allergies  Allergen Reactions  . Codeine Nausea Only  . Prednisone Hives and Other (See Comments)    In different places all over the body.  . Sulfa Antibiotics Nausea Only  . Sulfonamide Derivatives Nausea Only    Medications Prior to Admission  Medication Sig Dispense Refill  . acetaminophen (TYLENOL) 325 MG tablet Take 650 mg by mouth every 6 (six) hours as needed for  mild pain or headache.     . Biotin 10 MG CAPS Take 10 mg by mouth daily.     Marland Kitchen BREO ELLIPTA 200-25 MCG/INH AEPB INHALE 1 PUFF INTO THE LUNGS ONCE DAILY 60 each 3  . Calcium Carbonate-Vitamin D (CALCIUM 600 + D PO) Take 1 tablet by mouth daily.     . Cholecalciferol (VITAMIN D) 2000 UNITS CAPS Take 2,000 Units by mouth daily.     . furosemide (LASIX) 40 MG tablet TAKE 1 TABLET BY MOUTH EVERY DAY 5 TIMES A WEEK (Patient taking differently: Take 40 mg by mouth See admin instructions. Take 40 mg by mouth daily 5 times weekly) 20 tablet 11  . lovastatin (MEVACOR) 40 MG tablet TAKE 1 TABLET BY MOUTH AT BEDTIME 30 tablet 4  . montelukast (SINGULAIR) 10 MG tablet TAKE 1 TABLET BY MOUTH AT BEDTIME (Patient taking differently: Take 10 mg by mouth at bedtime. ) 30 tablet 5  . Multiple Vitamins-Minerals (PRESERVISION AREDS 2 PO) Take 2 capsules by mouth daily.    . nitrofurantoin, macrocrystal-monohydrate, (MACROBID) 100 MG capsule Take 100 mg by mouth daily.    . polycarbophil (FIBERCON) 625 MG tablet Take 625 mg by mouth daily.    . polyethylene glycol (MIRALAX / GLYCOLAX) packet Take 17 g by mouth daily. 14 each 0  .  potassium chloride (K-DUR) 10 MEQ tablet TAKE 2 TABLETS BY MOUTH ONCE DAILY (Patient taking differently: Take 20 mEq by mouth See admin instructions. Take 20 meq by mouth daily 5 times weekly) 60 tablet 5  . rivaroxaban (XARELTO) 20 MG TABS tablet Take 1 tablet (20 mg total) by mouth daily with supper. 30 tablet 6  . SPIRIVA HANDIHALER 18 MCG inhalation capsule INHALE THE CONTENTS OF ONE CAPSULE IN THE HANDIHALER ONCE DAILY (Patient taking differently: Place 18 mcg into inhaler and inhale daily. ) 30 capsule 3  . Talc (ZEASORB EX) Apply 1 application topically daily.    . TOVIAZ 8 MG TB24 tablet Take 8 mg by mouth daily.   11  . vitamin B-12 (CYANOCOBALAMIN) 500 MCG tablet Take 500 mcg by mouth daily.    Marland Kitchen albuterol (PROVENTIL HFA;VENTOLIN HFA) 108 (90 Base) MCG/ACT inhaler Inhale 2 puffs into the lungs every 6 (six) hours as needed for wheezing. (Patient taking differently: Inhale 2 puffs into the lungs every 6 (six) hours as needed for wheezing or shortness of breath. ) 1 Inhaler 2  . amoxicillin (AMOXIL) 500 MG capsule take 4 capsules by mouth 30-60 MINUTES PRIOR TO DENTAL APPT (Patient taking differently: Take 2,000 mg by mouth See admin instructions. Take 2000 mg by mouth 30-60 minutes prior to dental appointment) 4 capsule 12  . doxycycline (VIBRA-TABS) 100 MG tablet Take 1 tablet (100 mg total) by mouth 2 (two) times daily. (Patient not taking: Reported on 07/04/2018) 10 tablet 0  . ipratropium-albuterol (DUONEB) 0.5-2.5 (3) MG/3ML SOLN Take 3 mLs by nebulization every 6 (six) hours as needed. (Patient taking differently: Take 3 mLs by nebulization every 6 (six) hours as needed (for shortness of breath or wheezing). ) 360 mL 0  . Polyvinyl Alcohol-Povidone (REFRESH OP) Place 2 drops into both eyes daily as needed (for dry eyes).    . traMADol (ULTRAM) 50 MG tablet Take 0.5-1 tablets (25-50 mg total) by mouth 2 (two) times daily as needed for moderate pain. 30 tablet 0    Results for orders placed  or performed during the hospital encounter of 07/17/18 (from the past 48 hour(s))  Protime-INR  Status: None   Collection Time: 07/17/18  6:53 AM  Result Value Ref Range   Prothrombin Time 15.2 11.4 - 15.2 seconds   INR 1.21     Comment: Performed at [facility] [address], [place], [place] [address]   *Note: Due to a large number of results and/or encounters for the requested time period, some results have not been displayed. A complete set of results can be found in Results Review.   No results found.  Review of Systems  Constitutional: Negative.   HENT: Negative.   Eyes: Negative.   Respiratory: Negative.   Cardiovascular: Negative.   Gastrointestinal: Negative.   Genitourinary: Negative.   Musculoskeletal: Positive for back pain.  Skin: Negative.   Neurological:       Foot drop on the right weakness in the left tibialis anterior 4 out of 5  Endo/Heme/Allergies: Negative.   Psychiatric/Behavioral: Negative.     Blood pressure 127/70, pulse 70, temperature (!) 97.4 F (36.3 C), resp. rate 20, SpO2 96 %. Physical Exam  Constitutional: She is oriented to person, place, and time. She appears well-developed and well-nourished.  HENT:  Head: Normocephalic and atraumatic.  Eyes: Pupils are equal, round, and reactive to light. Conjunctivae and EOM are normal.  Neck: Normal range of motion. Neck supple.  Cardiovascular: Normal rate and regular rhythm.  Respiratory: Effort normal and breath sounds normal.  GI: Soft. Bowel sounds are normal.  Musculoskeletal: Normal range of motion.  Weakness in the tibialis anterior on the left 4 out of 5 foot drop on the right gastroc strength is 4 out of 5 absent reflexes in the patella and the Achilles.  Positive straight leg raising at 15 degrees Patrick's maneuver is negative bilaterally  Neurological: She is alert and oriented to person, place, and time.  Cranial nerve examination is normal upper extremity strength is normal  absent deep tendon reflexes in the biceps and triceps absent reflexes in the patella and Achilles foot drop on the right weakness in the tibialis anterior and gastrocs at 4 out of 5 on the left and in both gastrocs.  Skin: Skin is warm and dry.  Psychiatric: She has a normal mood and affect. Her behavior is normal. Judgment and thought content normal.     Assessment/Plan Spondylosis and stenosis L2-3 and L3-4 Anterolateral decompression L2-3 and L3-4 lateral plate fixation.  Neural monitoring.  Earleen Newport, MD 07/17/2018, 7:34 AM

## 2018-07-17 NOTE — Plan of Care (Signed)
  Problem: Safety: Goal: Ability to remain free from injury will improve Outcome: Progressing   

## 2018-07-17 NOTE — Anesthesia Postprocedure Evaluation (Signed)
Anesthesia Post Note  Patient: Debria S Dembeck  Procedure(s) Performed: Lumbar Two-Three Lumbar Three-Four Anterolateral decompression/interbody fusion with lateral plate fixation/Infuse (Left Spine Lumbar)     Patient location during evaluation: PACU Anesthesia Type: General Level of consciousness: awake and alert, oriented and patient cooperative Pain management: pain level controlled Vital Signs Assessment: post-procedure vital signs reviewed and stable Respiratory status: spontaneous breathing, nonlabored ventilation, respiratory function stable and patient connected to nasal cannula oxygen Cardiovascular status: blood pressure returned to baseline and stable Postop Assessment: no apparent nausea or vomiting Anesthetic complications: no Comments: St Jude rep checked and re-programmed pacemaker    Last Vitals:  Vitals:   07/17/18 1245 07/17/18 1310  BP: (!) 104/50 (!) 112/54  Pulse: 70 70  Resp: 17 16  Temp: 36.5 C 36.5 C  SpO2: 100% 97%    Last Pain:  Vitals:   07/17/18 1310  TempSrc: Oral  PainSc:                  Antiono Ettinger,E. Jerriah Ines

## 2018-07-18 ENCOUNTER — Encounter (HOSPITAL_COMMUNITY): Payer: Self-pay | Admitting: Neurological Surgery

## 2018-07-18 LAB — BASIC METABOLIC PANEL
Anion gap: 6 (ref 5–15)
BUN: 12 mg/dL (ref 8–23)
CO2: 22 mmol/L (ref 22–32)
Calcium: 7.6 mg/dL — ABNORMAL LOW (ref 8.9–10.3)
Chloride: 110 mmol/L (ref 98–111)
Creatinine, Ser: 0.58 mg/dL (ref 0.44–1.00)
GFR calc Af Amer: >60 mL/min (ref >60)
GFR calc non Af Amer: >60 mL/min (ref >60)
Glucose, Bld: 104 mg/dL — ABNORMAL HIGH (ref 70–99)
Potassium: 3.6 mmol/L (ref 3.5–5.1)
Sodium: 138 mmol/L (ref 135–145)

## 2018-07-18 LAB — CBC
HCT: 33.8 % — ABNORMAL LOW (ref 36.0–46.0)
Hemoglobin: 10.7 g/dL — ABNORMAL LOW (ref 12.0–15.0)
MCH: 32.7 pg (ref 26.0–34.0)
MCHC: 31.7 g/dL (ref 30.0–36.0)
MCV: 103.4 fL — ABNORMAL HIGH (ref 80.0–100.0)
Platelets: 94 10*3/uL — ABNORMAL LOW (ref 150–400)
RBC: 3.27 MIL/uL — ABNORMAL LOW (ref 3.87–5.11)
RDW: 13.9 % (ref 11.5–15.5)
WBC: 6.3 10*3/uL (ref 4.0–10.5)
nRBC: 0 % (ref 0.0–0.2)

## 2018-07-18 LAB — URINALYSIS, ROUTINE W REFLEX MICROSCOPIC
Bilirubin Urine: NEGATIVE
Glucose, UA: NEGATIVE mg/dL
Hgb urine dipstick: NEGATIVE
Ketones, ur: NEGATIVE mg/dL
Nitrite: NEGATIVE
Protein, ur: NEGATIVE mg/dL
Specific Gravity, Urine: 1.024 (ref 1.005–1.030)
pH: 5 (ref 5.0–8.0)

## 2018-07-18 NOTE — NC FL2 (Signed)
[place] LEVEL OF CARE SCREENING TOOL     IDENTIFICATION  Patient Name: [name] Birthdate: Apr 29, 1946 Sex: female Admission Date (Current Location): 07/17/2018  [facility] and [place] Number:  Herbalist and Address:  [facility], [place] [address], [place], [place] [address]      Provider Number: [id number]  Attending Physician Name and Address:  Kristeen Miss, MD  Relative Name and Phone Number:  Matalie Romberger; son; [phone]    Current Level of Care: Hospital Recommended Level of Care: [place] Prior Approval Number:    Date Approved/Denied:   PASRR Number: [id number]  Discharge Plan: SNF    Current Diagnoses: Patient Active Problem List   Diagnosis Date Noted  . Other spondylosis with radiculopathy, lumbar region 07/17/2018  . Syncope 05/28/2018  . Malaise and fatigue 01/22/2018  . Pain of right sacroiliac joint 08/09/2017  . 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 Vitamin D deficiency 10/23/2016  . Bilateral hip pain 09/08/2016  . Symptomatic bradycardia 06/15/2016  . Personal history of colonic polyps   . Personal history of rectal cancer   . Pedal edema 02/25/2016  . CAP (community acquired pneumonia) 02/08/2016  . Advanced care planning/counseling discussion 10/28/2015  . CKD (chronic kidney disease) stage 3, GFR 30-59 ml/min (HCC) 07/03/2015  . Dyspnea on exertion 06/05/2015  . Mixed stress and urge urinary incontinence 03/16/2015  . Vitamin B12 deficiency 02/28/2015  . Chronic insomnia 02/25/2015  . Chronic cholecystitis with calculus 01/21/2015  . Osteopenia 12/09/2014  . Medicare annual wellness visit, subsequent 10/15/2014  . Health maintenance examination 10/15/2014  . Adjustment disorder with depressed mood 10/15/2014  . Chronic diastolic heart failure ([place]) 09/19/2014  . Chest pain 08/25/2014  . 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  Pulmonary nodule 02/23/2012  . History of radiation therapy   . Rectal cancer, midrectum ypT3ypN0, s/p neoadj chemoXRT, lap LAR with diverting loop ileostomy 08/2011, ileostomy takedown 03/2012 05/26/2011  . Essential hypertension, benign 03/24/2009  . BMI 25.0-25.9,adult 03/05/2008  . Chronic lumbar pain 03/05/2008  . MERALGIA PARESTHETICA 12/13/2007  . GERD (gastroesophageal reflux disease) 08/06/2007  . HLD (hyperlipidemia) 05/03/2007  . COPD (chronic obstructive pulmonary disease) ([place]) 05/03/2007    Orientation RESPIRATION BLADDER Height & Weight     Self, Situation, Time, Place  Normal Continent Weight:   Height:     BEHAVIORAL SYMPTOMS/MOOD NEUROLOGICAL BOWEL NUTRITION STATUS      Continent Diet(see discharge summary)  AMBULATORY STATUS COMMUNICATION OF NEEDS Skin   Extensive Assist Verbally Surgical wounds(incision on left back)                       Personal Care Assistance Level of Assistance  Bathing, Feeding, Dressing Bathing Assistance: Maximum assistance Feeding assistance: Independent Dressing Assistance: Maximum assistance     Functional Limitations Info  Sight, Hearing, Speech Sight Info: Impaired(legally blind in left eye) Hearing Info: Adequate Speech Info: Adequate    SPECIAL CARE FACTORS FREQUENCY  PT (By licensed PT), OT (By licensed OT)     PT Frequency: 5x week OT Frequency: 5x week            Contractures Contractures Info: Not present    Additional Factors Info  Code Status, Allergies Code Status Info: Full Code Allergies Info: CODEINE, PREDNISONE, SULFA ANTIBIOTICS, SULFONAMIDE DERIVATIVES            Current Medications (07/18/2018):  This is the  current hospital active medication list Current Facility-Administered Medications  Medication Dose Route Frequency Provider Last Rate Last Dose  . acetaminophen (TYLENOL) tablet 650 mg  650 mg Oral Q4H PRN Kristeen Miss, MD       Or  . acetaminophen (TYLENOL) suppository 650 mg  650  mg Rectal Q4H PRN Kristeen Miss, MD      . albuterol (PROVENTIL) (2.5 MG/3ML) 0.083% nebulizer solution 3 mL  3 mL Inhalation Q6H PRN Kristeen Miss, MD      . alum & mag hydroxide-simeth (MAALOX/MYLANTA) 200-200-20 MG/5ML suspension 30 mL  30 mL Oral Q6H PRN Kristeen Miss, MD      . bisacodyl (DULCOLAX) suppository 10 mg  10 mg Rectal Daily PRN Kristeen Miss, MD      . docusate sodium (COLACE) capsule 100 mg  100 mg Oral BID Kristeen Miss, MD   100 mg at 07/18/18 0937  . fesoterodine (TOVIAZ) tablet 8 mg  8 mg Oral Daily Kristeen Miss, MD   8 mg at 07/18/18 0935  . fluticasone furoate-vilanterol (BREO ELLIPTA) 200-25 MCG/INH 1 puff  1 puff Inhalation Daily Kristeen Miss, MD   1 puff at 07/18/18 0845  . HYDROcodone-acetaminophen (NORCO/VICODIN) 5-325 MG per tablet 1-2 tablet  1-2 tablet Oral Q4H PRN Kristeen Miss, MD   2 tablet at 07/18/18 0935  . ipratropium-albuterol (DUONEB) 0.5-2.5 (3) MG/3ML nebulizer solution 3 mL  3 mL Nebulization Q6H PRN Kristeen Miss, MD      . lactated ringers infusion   Intravenous Continuous Kristeen Miss, MD 75 mL/hr at 07/17/18 1553    . menthol-cetylpyridinium (CEPACOL) lozenge 3 mg  1 lozenge Oral PRN Kristeen Miss, MD       Or  . phenol (CHLORASEPTIC) mouth spray 1 spray  1 spray Mouth/Throat PRN Kristeen Miss, MD      . methocarbamol (ROBAXIN) tablet 500 mg  500 mg Oral Q6H PRN Kristeen Miss, MD   500 mg at 07/18/18 0934   Or  . methocarbamol (ROBAXIN) 500 mg in dextrose 5 % 50 mL IVPB  500 mg Intravenous Q6H PRN Kristeen Miss, MD      . montelukast (SINGULAIR) tablet 10 mg  10 mg Oral Antoine Poche, MD   10 mg at 07/17/18 2144  . morphine 2 MG/ML injection 2 mg  2 mg Intravenous Q2H PRN Kristeen Miss, MD   2 mg at 07/17/18 1552  . multivitamin (PROSIGHT) tablet 1 tablet  1 tablet Oral Daily Kristeen Miss, MD   1 tablet at 07/18/18 0936  . ondansetron (ZOFRAN) tablet 4 mg  4 mg Oral Q6H PRN Kristeen Miss, MD       Or  . ondansetron (ZOFRAN) injection 4 mg   4 mg Intravenous Q6H PRN Kristeen Miss, MD      . polycarbophil (FIBERCON) tablet 625 mg  625 mg Oral Daily Kristeen Miss, MD   625 mg at 07/18/18 0936  . polyethylene glycol (MIRALAX / GLYCOLAX) packet 17 g  17 g Oral Daily Kristeen Miss, MD   17 g at 07/18/18 8657  . polyethylene glycol (MIRALAX / GLYCOLAX) packet 17 g  17 g Oral Daily PRN Kristeen Miss, MD      . polyvinyl alcohol (LIQUIFILM TEARS) 1.4 % ophthalmic solution 1 drop  1 drop Both Eyes BID PRN Kristeen Miss, MD      . pravastatin (PRAVACHOL) tablet 40 mg  40 mg Oral q1800 Kristeen Miss, MD   40 mg at 07/17/18 1708  . senna (SENOKOT) tablet 8.6  mg  1 tablet Oral BID Kristeen Miss, MD   8.6 mg at 07/18/18 8937  . sodium chloride flush (NS) 0.9 % injection 3 mL  3 mL Intravenous Austin Miles, MD   3 mL at 07/18/18 0938  . sodium chloride flush (NS) 0.9 % injection 3 mL  3 mL Intravenous PRN Kristeen Miss, MD      . sodium phosphate (FLEET) 7-19 GM/118ML enema 1 enema  1 enema Rectal Once PRN Kristeen Miss, MD      . traMADol Veatrice Bourbon) tablet 25-50 mg  25-50 mg Oral BID PRN Kristeen Miss, MD      . umeclidinium bromide (INCRUSE ELLIPTA) 62.5 MCG/INH 1 puff  1 puff Inhalation Daily Hammons, Theone Murdoch, RPH   1 puff at 07/18/18 0847     Discharge Medications: Please see discharge summary for a list of discharge medications.  Relevant Imaging Results:  Relevant Lab Results:   Additional Information [id number] [place], [place]

## 2018-07-18 NOTE — Evaluation (Signed)
Occupational Therapy Evaluation Patient Details Name: [name] MRN: [id number] DOB: 04-02-1946 Today's Date: 07/18/2018    History of Present Illness [name] is a 72 y/o female who had significant back pain and leg pain and she is developed significant weakness in the left lower extremity with foot drop. Anterolateral decompression L2-3 L3-4 with XLIF allograft and infuse.  Lateral plate fixation M0-1 U2-7. Pt has a PMH including Arthritis, Cataracts-bilateral, Chronic diastolic heart failure, COPD, DDD, Dyspnea, Essential HTN, Legally blind in left eye, Osteopenia, pacemaker, Rectal CA (04/2011), Rheumatic heart disease mitral stenosis, AVR and MVR (2015), Seizures.   Clinical Impression   PTA Pt independent in ADL and mobility with rollator. Pt is currently mod A for LB dressing/bathing and min A for donning brace sitting EOB. Back handout provided and reviewed adls in detail. Pt educated on: clothing between brace, never sleep in brace, set an alarm at night for medication, avoid sitting for long periods of time, correct bed positioning for sleeping, correct sequence for bed mobility, avoiding lifting more than 5 pounds and never wash directly over incision. Pt also educated in compensatory strategies for grooming tasks (cup for oral care, setting up on right side for twisting etc) Pt will benefit from skilled OT in the acute setting as well as afterwards at the [facility] level. Pt will be monitored closely to ensure she does not need SNF level care post-acute. She is hopeful to go to her son's home at dc. Next session take AE kit and demo/practice AE.     Follow Up Recommendations  Home health OT;Supervision/Assistance - 24 hour(initially)    Equipment Recommendations  None recommended by OT(Pt has appropriate DME - maybe AE kit)    Recommendations for Other Services       Precautions / Restrictions Precautions Precautions: Fall;Back Precaution Booklet Issued: Yes  (comment) Precaution Comments: Reviewed precautions and reviewed handout in detail Required Braces or Orthoses: Spinal Brace Spinal Brace: Lumbar corset;Applied in sitting position Restrictions Weight Bearing Restrictions: No      Mobility Bed Mobility               General bed mobility comments: received sitting EOB  Transfers Overall transfer level: Needs assistance Equipment used: Rolling walker (2 wheeled) Transfers: Sit to/from Stand Sit to Stand: Min guard         General transfer comment: good hand placement standing from EOB, close min guard for safety and vc for back precautions (bending)    Balance Overall balance assessment: Needs assistance Sitting-balance support: Feet supported;No upper extremity supported Sitting balance-Leahy Scale: Fair     Standing balance support: Bilateral upper extremity supported;During functional activity Standing balance-Leahy Scale: Poor Standing balance comment: Reliant on UE support on walker for dynamic standing activity.                            ADL either performed or assessed with clinical judgement   ADL Overall ADL's : Needs assistance/impaired Eating/Feeding: Modified independent   Grooming: Min guard;Standing;Wash/dry hands;Oral care Grooming Details (indicate cue type and reason): edcuated in compensatory strategies for ADL.  Upper Body Bathing: Moderate assistance   Lower Body Bathing: Moderate assistance Lower Body Bathing Details (indicate cue type and reason): unable to perform figure 4 at this time Upper Body Dressing : Moderate assistance;Sitting Upper Body Dressing Details (indicate cue type and reason): to don brace Lower Body Dressing: Maximal assistance;Sit to/from stand Lower Body Dressing Details (indicate cue  type and reason): unable to perform figure 4 at this time Toilet Transfer: Min guard;Ambulation;RW;Comfort height toilet;Grab bars Toilet Transfer Details (indicate cue type  and reason): vc for back precautions - good hand placement from EOB   Toileting - Clothing Manipulation Details (indicate cue type and reason): Pt with foley cath in place currently     Functional mobility during ADLs: Min guard;Rolling walker General ADL Comments: reviewed handout in full; education on cup method and setting up on right to prevent twisting, brace education, and      Vision Patient Visual Report: No change from baseline       Perception     Praxis      Pertinent Vitals/Pain Pain Assessment: 0-10 Pain Score: 8  Pain Location: Back Pain Descriptors / Indicators: Operative site guarding;Grimacing Pain Intervention(s): Limited activity within patient's tolerance     Hand Dominance Right   Extremity/Trunk Assessment Upper Extremity Assessment Upper Extremity Assessment: Generalized weakness   Lower Extremity Assessment Lower Extremity Assessment: Defer to PT evaluation   Cervical / Trunk Assessment Cervical / Trunk Assessment: Kyphotic;Other exceptions Cervical / Trunk Exceptions: s/p surgery   Communication Communication Communication: No difficulties   Cognition Arousal/Alertness: Awake/alert Behavior During Therapy: WFL for tasks assessed/performed Overall Cognitive Status: Within Functional Limits for tasks assessed                                     General Comments  watch for foot drop    Exercises     Shoulder Instructions      Home Living Family/patient expects to be discharged to:: Private residence Living Arrangements: Alone Available Help at Discharge: Family;Available 24 hours/day Type of Home: House Home Access: Stairs to enter [employer] of Steps: 4-5(Approx - unable to remember exactly for her son's house)   Home Layout: One level     Bathroom Shower/Tub: Occupational psychologist: Standard     Home Equipment: Grab bars - tub/shower;Shower seat - built in;Walker - 4 wheels    Additional Comments: Above information is for son's home. She is planning to d/c there and will have daughter-in-law available 24 hours for support. Reports she has meals prepped and church friends to assist with grocery shopping as needed as well.       Prior Functioning/Environment Level of Independence: Independent with assistive device(s)        Comments: Using rollator         OT Problem List: Decreased activity tolerance;Impaired balance (sitting and/or standing);Decreased safety awareness;Decreased knowledge of use of DME or AE;Decreased knowledge of precautions;Pain      OT Treatment/Interventions: Self-care/ADL training;DME and/or AE instruction;Therapeutic activities;Patient/family education;Balance training    OT Goals(Current goals can be found in the care plan section) Acute Rehab OT Goals Patient Stated Goal: To go to son's home and avoid SNF OT Goal Formulation: With patient Time For Goal Achievement: 08/01/18 Potential to Achieve Goals: Good ADL Goals Pt Will Perform Grooming: with modified independence;standing Pt Will Perform Upper Body Dressing: with modified independence;sitting Pt Will Perform Lower Body Dressing: with supervision;with adaptive equipment;sit to/from stand Pt Will Transfer to Toilet: with modified independence;ambulating Pt Will Perform Toileting - Clothing Manipulation and hygiene: with modified independence;sit to/from stand;sitting/lateral leans Additional ADL Goal #1: Pt will perform log roll bed mobility at mod I level prior to engaging in ADL activity  OT Frequency: Min 3X/week   Barriers to D/C:  Co-evaluation              AM-PAC PT "6 Clicks" Daily Activity     Outcome Measure Help from another person eating meals?: None Help from another person taking care of personal grooming?: A Little Help from another person toileting, which includes using toliet, bedpan, or urinal?: A Little Help from another person  bathing (including washing, rinsing, drying)?: A Lot Help from another person to put on and taking off regular upper body clothing?: A Little Help from another person to put on and taking off regular lower body clothing?: A Lot 6 Click Score: 17   End of Session Equipment Utilized During Treatment: Back brace;Rolling walker Nurse Communication: Mobility status;Patient requests pain meds;Precautions  Activity Tolerance: Patient tolerated treatment well Patient left: Other (comment)(on commode with PT)  OT Visit Diagnosis: Unsteadiness on feet (R26.81);Other abnormalities of gait and mobility (R26.89);Muscle weakness (generalized) (M62.81);Pain Pain - Right/Left: Left Pain - part of body: Leg(back)                Time: 3846-6599 OT Time Calculation (min): 25 min Charges:  OT General Charges $OT Visit: 1 Visit OT Evaluation $OT Eval Moderate Complexity: 1 Mod OT Treatments $Self Care/Home Management : 8-22 mins  Hulda Humphrey OTR/L Acute Rehabilitation Services Pager: [phone] Office: [phone]  Merri Ray Bentleigh Waren 07/18/2018, 3:27 PM

## 2018-07-18 NOTE — Clinical Social Work Note (Signed)
Clinical Social Work Assessment  Patient Details  Name: [name] MRN: [id number] Date of Birth: Jul 20, 1946  Date of referral:  07/18/18               Reason for consult:  Facility Placement, Discharge Planning                Permission sought to share information with:  Facility Sport and exercise psychologist, Family Supports Permission granted to share information::  Yes, Verbal Permission Granted  Name::     Ailea Rhatigan  Agency::  [facility]  Relationship::  son  Contact Information:  [phone]  Housing/Transportation Living arrangements for the past 2 months:  [place] of Information:  Patient Patient Interpreter Needed:  None Criminal Activity/Legal Involvement Pertinent to Current Situation/Hospitalization:  No - Comment as needed Significant Relationships:  Adult Children, Church, Industrial/product designer, Pets, Other Family Members Lives with:  Self Do you feel safe going back to the place where you live?  Yes Need for family participation in patient care:  Yes (Comment)  Care giving concerns:  Pt lives alone, has support from neighbors but no 24/7 assistance if she stays at home. If pt goes to her sons house she will have 24/7 assistance from son and daughter in law. Pt in between SNF and her sons home with home health.    Social Worker assessment / plan:  CSW spoke with pt at bedside, introduced self and role. Pt lives alone in [place] with her cat and support from neighbors and church. Preoperatively pt was planning on going to her sons home in [place]. Pt states that she is having more discomfort with her leg than she was anticipating and would be amenable to going to SNF. Pre[facility] for [facility] and [facility] If she discharges home with home health she would still prefer to go to her sons home in [place].   CSW will follow for progress. If pt does prefer SNF at discharge will have to submit for [facility] Medicare authorization.    Employment status:  Retired Nurse, adult PT Recommendations:  [place], Home with [place] / Referral to community resources:  [place]  Patient/Family's Response to care:  Pt amenable to speaking with CSW and understands SNF referral process. Preference for home health at sons home and SNF closer to her home based on progress.  Patient/Family's Understanding of and Emotional Response to Diagnosis, Current Treatment, and Prognosis:  Pt states understanding of diagnosis, current treatment and prognosis. Pt knowledgeable regarding limitations and care needs at discharge. Emotionally appropriate throughout assessment and clear in her two choices based on her progress here at hospital with acute rehab team.   Emotional Assessment Appearance:  Appears stated age Attitude/Demeanor/Rapport:  Engaged, Gracious Affect (typically observed):  Accepting, Adaptable, Appropriate, Pleasant Orientation:  Oriented to Self, Oriented to Place, Oriented to  Time, Oriented to Situation Alcohol / Substance use:  Not Applicable Psych involvement (Current and /or in the community):  No (Comment)  Discharge Needs  Concerns to be addressed:  Care Coordination Readmission within the last 30 days:  No Current discharge risk:  Lives alone, Physical Impairment Barriers to Discharge:  Ship broker, Continued Medical Work up   [employer], [place] 07/18/2018, 12:16 PM

## 2018-07-18 NOTE — Plan of Care (Signed)
  Problem: Education: Goal: Ability to verbalize activity precautions or restrictions will improve Outcome: Progressing Goal: Knowledge of the prescribed therapeutic regimen will improve Outcome: Progressing Goal: Understanding of discharge needs will improve Outcome: Progressing   Problem: Activity: Goal: Ability to avoid complications of mobility impairment will improve Outcome: Progressing Goal: Ability to tolerate increased activity will improve Outcome: Progressing Goal: Will remain free from falls Outcome: Progressing   Problem: Bowel/Gastric: Goal: Gastrointestinal status for postoperative course will improve Outcome: Progressing   Problem: Clinical Measurements: Goal: Ability to maintain clinical measurements within normal limits will improve Outcome: Progressing Goal: Postoperative complications will be avoided or minimized Outcome: Progressing Goal: Diagnostic test results will improve Outcome: Progressing   Problem: Pain Management: Goal: Pain level will decrease Outcome: Progressing   Problem: Health Behavior/Discharge Planning: Goal: Identification of resources available to assist in meeting health care needs will improve Outcome: Progressing   Problem: Bladder/Genitourinary: Goal: Urinary functional status for postoperative course will improve Outcome: Progressing

## 2018-07-18 NOTE — Social Work (Signed)
CSW acknowledging consult for SNF placement. Will follow for therapy recommendations.   Chloe Flis H Sheina Mcleish, LCSWA [place] Clinical Social Work [phone]   

## 2018-07-18 NOTE — Progress Notes (Signed)
Patient ID: [name], female   DOB: 08/19/1946, 72 y.o.   MRN: [id number] Vital signs are stable Ambulation with left foot drop is somewhat difficult Patient still having Foley catheter Will likely need longer term stay We will transfer up to 4 N. Discussed possibility of requiring skilled nursing facility for a while Patient has plans to go home with her son in [place] We will see how she does in the next day or 2

## 2018-07-18 NOTE — Evaluation (Signed)
Physical Therapy Evaluation Patient Details Name: [name] MRN: [id number] DOB: 07/26/1946 Today's Date: 07/18/2018   History of Present Illness  [name] is a 72 y/o female who had significant back pain and leg pain and she is developed significant weakness in the left lower extremity with foot drop. Anterolateral decompression L2-3 L3-4 with XLIF allograft and infuse.  Lateral plate fixation Y6-5 K3-5. Pt has a PMH including Arthritis, Cataracts-bilateral, Chronic diastolic heart failure, COPD, DDD, Dyspnea, Essential HTN, Legally blind in left eye, Osteopenia, pacemaker, Rectal CA (04/2011), Rheumatic heart disease mitral stenosis, AVR and MVR (2015), Seizures.  Clinical Impression  Pt admitted with above diagnosis. Pt currently with functional limitations due to the deficits listed below (see PT Problem List). At the time of PT eval pt was able to perform transfers and ambulation with gross min assist to min guard assist and RW for safety. Noted decreased dorsiflexion in L ankle, however overall pt was clearing the floor well during swing through phase of gait cycle. Pt wanting to avoid SNF if at all possible. I anticipate she will progress well and will be safe to return home with family and HHPT to follow up. Pt's main concern is negotiating stairs, and assured her we would incorporate stair training into our sessions. Will continue to assess, and if pt not improving, may need a higher level of care (SNF). Discussed this with pt and she was agreeable. Acutely, pt will benefit from skilled PT to increase their independence and safety with mobility to allow discharge to the venue listed below.       Follow Up Recommendations Home health PT;Supervision/Assistance - 24 hour    Equipment Recommendations  Rolling walker with 5" wheels    Recommendations for Other Services       Precautions / Restrictions Precautions Precautions: Fall;Back Precaution Booklet Issued: Yes  (comment) Precaution Comments: Reviewed precautions in detail with pt and she was cued for maintenance of precautions during functional mobility.  Required Braces or Orthoses: Spinal Brace Spinal Brace: Lumbar corset;Applied in sitting position Restrictions Weight Bearing Restrictions: No      Mobility  Bed Mobility               General bed mobility comments: Pt was received sitting on commode with OT  Transfers Overall transfer level: Needs assistance Equipment used: Rolling walker (2 wheeled) Transfers: Sit to/from Stand Sit to Stand: Min guard;Min assist         General transfer comment: Hands-on guarding and minimal assist required to power-up to full standing position.  VC's for hand placement on seated surface for safety.  Ambulation/Gait Ambulation/Gait assistance: Min guard Gait Distance (Feet): 200 Feet Assistive device: Rolling walker (2 wheeled) Gait Pattern/deviations: Step-through pattern;Decreased stride length;Decreased dorsiflexion - left;Decreased weight shift to left;Trunk flexed Gait velocity: Decreased Gait velocity interpretation: <1.31 ft/sec, indicative of household ambulator General Gait Details: VC's for improved posture, closer walker proximity, and general safety. Pt with decreased DF on the L however able to clear foot with swing through.   Stairs            Wheelchair Mobility    Modified Rankin (Stroke Patients Only)       Balance Overall balance assessment: Needs assistance Sitting-balance support: Feet supported;No upper extremity supported Sitting balance-Leahy Scale: Fair     Standing balance support: Bilateral upper extremity supported;During functional activity Standing balance-Leahy Scale: Poor Standing balance comment: Reliant on UE support on walker for dynamic standing activity.  Pertinent Vitals/Pain Pain Assessment: 0-10 Pain Score: 8  Pain Location: Back Pain  Descriptors / Indicators: Operative site guarding;Grimacing Pain Intervention(s): Limited activity within patient's tolerance;Monitored during session;Repositioned    Home Living Family/patient expects to be discharged to:: Private residence Living Arrangements: Alone Available Help at Discharge: Family;Available 24 hours/day Type of Home: House Home Access: Stairs to enter   [employer] of Steps: 4-5(Approx - unable to remember exactly for her son's house) Home Layout: One level Home Equipment: Grab bars - tub/shower;Shower seat - built in;Walker - 4 wheels Additional Comments: Above information is for son's home. She is planning to d/c there and will have daughter-in-law available 24 hours for support. Reports she has meals prepped and church friends to assist with grocery shopping as needed as well.     Prior Function Level of Independence: Independent with assistive device(s)         Comments: Using rollator      Hand Dominance        Extremity/Trunk Assessment   Upper Extremity Assessment Upper Extremity Assessment: Defer to OT evaluation    Lower Extremity Assessment Lower Extremity Assessment: LLE deficits/detail LLE Deficits / Details: Decreased strength, AROM, and muscular endurance consistent with pre-op diagnosis. 2/5 strength in DF    Cervical / Trunk Assessment Cervical / Trunk Assessment: Kyphotic;Other exceptions Cervical / Trunk Exceptions: s/p surgery  Communication   Communication: No difficulties  Cognition Arousal/Alertness: Awake/alert Behavior During Therapy: WFL for tasks assessed/performed Overall Cognitive Status: Within Functional Limits for tasks assessed                                        General Comments      Exercises     Assessment/Plan    PT Assessment Patient needs continued PT services  PT Problem List Decreased strength;Decreased range of motion;Decreased balance;Decreased activity  tolerance;Decreased mobility;Decreased knowledge of use of DME;Decreased safety awareness;Decreased knowledge of precautions;Pain       PT Treatment Interventions DME instruction;Gait training;Stair training;Functional mobility training;Therapeutic activities;Therapeutic exercise;Neuromuscular re-education;Patient/family education    PT Goals (Current goals can be found in the Care Plan section)  Acute Rehab PT Goals Patient Stated Goal: To go to son's home and avoid SNF PT Goal Formulation: With patient Time For Goal Achievement: 07/25/18 Potential to Achieve Goals: Good    Frequency Min 5X/week   Barriers to discharge        Co-evaluation               AM-PAC PT "6 Clicks" Daily Activity  Outcome Measure Difficulty turning over in bed (including adjusting bedclothes, sheets and blankets)?: Unable Difficulty moving from lying on back to sitting on the side of the bed? : Unable Difficulty sitting down on and standing up from a chair with arms (e.g., wheelchair, bedside commode, etc,.)?: Unable Help needed moving to and from a bed to chair (including a wheelchair)?: A Little Help needed walking in hospital room?: A Little Help needed climbing 3-5 steps with a railing? : A Lot 6 Click Score: 11    End of Session Equipment Utilized During Treatment: Gait belt;Back brace Activity Tolerance: Patient tolerated treatment well Patient left: in chair;with call bell/phone within reach;with nursing/sitter in room Nurse Communication: Mobility status PT Visit Diagnosis: Pain;Other symptoms and signs involving the nervous system (R29.898);Difficulty in walking, not elsewhere classified (R26.2) Pain - part of body: (back)    Time:  7800-4471 PT Time Calculation (min) (ACUTE ONLY): 46 min   Charges:   PT Evaluation $PT Eval Moderate Complexity: 1 Mod PT Treatments $Gait Training: 23-37 mins        Rolinda Roan, PT, DPT Acute Rehabilitation Services Pager:  [phone] Office: [phone]   [name] 07/18/2018, 11:31 AM

## 2018-07-19 MED ORDER — MAGNESIUM HYDROXIDE 400 MG/5ML PO SUSP: 30.0000 mL | Freq: Every day | ORAL | Status: DC | PRN

## 2018-07-19 MED ORDER — DEXAMETHASONE 2 MG PO TABS
2.0000 mg | ORAL_TABLET | Freq: Two times a day (BID) | ORAL | Status: DC
  Administered 2018-07-19 – 2018-07-23 (×8): 2 mg via ORAL
  Filled 2018-07-19 (×9): qty 1

## 2018-07-19 NOTE — Progress Notes (Signed)
Patient ID: [name], female   DOB: 06-Apr-1946, 72 y.o.   MRN: [id number] Patient notes that he has had a poorly feeling day.  Her function yesterday was much better than it has been today.  She notes that ambulating has been difficult with increased pain in the lower extremity particularly on the left.  Her incisions remain clean and dry.  She would prefer to ultimately be discharged home with her son but notes that if she cannot function fairly independently she may be better off and I skilled nursing facility.  We will see how she does with activity tomorrow.  PT and OT notes appreciated.

## 2018-07-19 NOTE — Progress Notes (Signed)
Physical Therapy Treatment Patient Details Name: [name] MRN: [id number] DOB: 21-Oct-1945 Today's Date: 07/19/2018    History of Present Illness [name] is a 72 y/o female who had significant back pain and leg pain and she is developed significant weakness in the left lower extremity with foot drop. Anterolateral decompression L2-3 L3-4 with XLIF allograft and infuse.  Lateral plate fixation K7-4 Q5-9. Pt has a PMH including Arthritis, Cataracts-bilateral, Chronic diastolic heart failure, COPD, DDD, Dyspnea, Essential HTN, Legally blind in left eye, Osteopenia, pacemaker, Rectal CA (04/2011), Rheumatic heart disease mitral stenosis, AVR and MVR (2015), Seizures.    PT Comments    Pt progressing slowly towards physical therapy goals. Reports feeling "terrible" today and states "I know I'm getting worse". Checked on pt a couple times earlier in the day and she was sleeping, so let pt rest as much as possible. Although pt may have fatigued quicker this session, she did not require as much assist for transfers this session, and pt continues to be able to clear L foot during swing through. Pt was not able to tolerate stair training however feel we could possibly initiate next session if pt feeling up to it. Will continue to follow.   Follow Up Recommendations  Home health PT;Supervision/Assistance - 24 hour     Equipment Recommendations  Rolling walker with 5" wheels    Recommendations for Other Services       Precautions / Restrictions Precautions Precautions: Fall;Back Precaution Booklet Issued: Yes (comment) Precaution Comments: Reviewed precautions and reviewed handout in detail Required Braces or Orthoses: Spinal Brace Spinal Brace: Lumbar corset;Applied in sitting position Restrictions Weight Bearing Restrictions: No    Mobility  Bed Mobility               General bed mobility comments: Pt was received sitting up on the [facility]  Transfers Overall transfer level:  Needs assistance Equipment used: Rolling walker (2 wheeled) Transfers: Sit to/from Stand Sit to Stand: Min guard         General transfer comment: Close guard for safety as pt powered up to full standing position. She demonstrated proper hand placement on seated surface but required x2 attempts to gain momentum to achieve full stand.   Ambulation/Gait Ambulation/Gait assistance: Min guard Gait Distance (Feet): 120 Feet Assistive device: Rolling walker (2 wheeled) Gait Pattern/deviations: Step-through pattern;Decreased stride length;Decreased dorsiflexion - left;Decreased weight shift to left;Trunk flexed Gait velocity: Decreased Gait velocity interpretation: 1.31 - 2.62 ft/sec, indicative of limited community ambulator General Gait Details: VC's for improved posture, closer walker proximity, and general safety. Pt with decreased DF on the L however able to clear foot with swing through.    Stairs             Wheelchair Mobility    Modified Rankin (Stroke Patients Only)       Balance Overall balance assessment: Needs assistance Sitting-balance support: Feet supported;No upper extremity supported Sitting balance-Leahy Scale: Fair     Standing balance support: Bilateral upper extremity supported;During functional activity Standing balance-Leahy Scale: Poor Standing balance comment: Reliant on UE support on walker for dynamic standing activity.                             Cognition Arousal/Alertness: Awake/alert Behavior During Therapy: Flat affect Overall Cognitive Status: Within Functional Limits for tasks assessed  Exercises      General Comments        Pertinent Vitals/Pain Pain Assessment: Faces Faces Pain Scale: Hurts even more Pain Location: Back Pain Descriptors / Indicators: Operative site guarding;Grimacing Pain Intervention(s): Monitored during session;Limited activity within  patient's tolerance;Repositioned    Home Living                      Prior Function            PT Goals (current goals can now be found in the care plan section) Acute Rehab PT Goals Patient Stated Goal: To go to son's home and avoid SNF PT Goal Formulation: With patient Time For Goal Achievement: 07/25/18 Potential to Achieve Goals: Good Progress towards PT goals: Progressing toward goals    Frequency    Min 5X/week      PT Plan Current plan remains appropriate    Co-evaluation              AM-PAC PT "6 Clicks" Daily Activity  Outcome Measure  Difficulty turning over in bed (including adjusting bedclothes, sheets and blankets)?: Unable Difficulty moving from lying on back to sitting on the side of the bed? : Unable Difficulty sitting down on and standing up from a chair with arms (e.g., wheelchair, bedside commode, etc,.)?: Unable Help needed moving to and from a bed to chair (including a wheelchair)?: A Little Help needed walking in hospital room?: A Little Help needed climbing 3-5 steps with a railing? : A Lot 6 Click Score: 11    End of Session Equipment Utilized During Treatment: Gait belt;Back brace Activity Tolerance: Patient tolerated treatment well Patient left: in chair;with call bell/phone within reach Nurse Communication: Mobility status PT Visit Diagnosis: Pain;Other symptoms and signs involving the nervous system (R29.898);Difficulty in walking, not elsewhere classified (R26.2) Pain - part of body: (back)     Time: 9024-0973 PT Time Calculation (min) (ACUTE ONLY): 18 min  Charges:  $Gait Training: 8-22 mins                     Rolinda Roan, PT, DPT Acute Rehabilitation Services Pager: [phone] Office: [phone]    [name] 07/19/2018, 3:03 PM

## 2018-07-19 NOTE — Social Work (Signed)
CSW met with pt at bedside- discussed pt SNF offers but that pt would need to still receive insurance authorization prior to discharging to a SNF. Pt states "my contact is still working on [facility] [employer] CSW discussed that we cannot hold pt for preferred bed because we needed to start insurance authorization. Pt's other preferred SNF [facility] has offered a bed.   Alexander Mt, [place] Work [phone]

## 2018-07-19 NOTE — Progress Notes (Signed)
Occupational Therapy Treatment Patient Details Name: OLIVA MONTECALVO MRN: [id number] DOB: 01-Jan-1946 Today's Date: 07/19/2018    History of present illness [name] is a 72 y/o female who had significant back pain and leg pain and she is developed significant weakness in the left lower extremity with foot drop. Anterolateral decompression L2-3 L3-4 with XLIF allograft and infuse.  Lateral plate fixation H0-6 C3-7. Pt has a PMH including Arthritis, Cataracts-bilateral, Chronic diastolic heart failure, COPD, DDD, Dyspnea, Essential HTN, Legally blind in left eye, Osteopenia, pacemaker, Rectal CA (04/2011), Rheumatic heart disease mitral stenosis, AVR and MVR (2015), Seizures.   OT comments  Pt not feeling well today therefore unable to do much. Pt nauseous and weak requiring more assist to get up and to walk in room.  Pt returned to bed and nursing notified.  Pt's urine during toileting very dark. Nursing notified.  Pt may need to consider SNF rehab unless she improves over next 24 hours.   Follow Up Recommendations  SNF;Supervision/Assistance - 24 hour    Equipment Recommendations  None recommended by OT    Recommendations for Other Services      Precautions / Restrictions Precautions Precautions: Fall;Back Precaution Booklet Issued: Yes (comment) Precaution Comments: Reviewed precautions and reviewed handout in detail Required Braces or Orthoses: Spinal Brace Spinal Brace: Lumbar corset;Applied in sitting position Restrictions Weight Bearing Restrictions: No       Mobility Bed Mobility Overal bed mobility: Needs Assistance Bed Mobility: Sit to Supine       Sit to supine: Min assist   General bed mobility comments: min assist to get feet on bed.  Transfers Overall transfer level: Needs assistance Equipment used: Rolling walker (2 wheeled) Transfers: Sit to/from Stand Sit to Stand: Min assist         General transfer comment: cues required today for sit to  stand.    Balance Overall balance assessment: Needs assistance Sitting-balance support: Feet supported;No upper extremity supported Sitting balance-Leahy Scale: Fair     Standing balance support: Bilateral upper extremity supported;During functional activity Standing balance-Leahy Scale: Poor Standing balance comment: Reliant on UE support on walker for dynamic standing activity.                            ADL either performed or assessed with clinical judgement   ADL Overall ADL's : Needs assistance/impaired     Grooming: Min guard;Standing;Wash/dry hands;Oral care Grooming Details (indicate cue type and reason): edcuated in compensatory strategies for ADL.          Upper Body Dressing : Moderate assistance;Sitting Upper Body Dressing Details (indicate cue type and reason): assist needed to doff and donn brace Lower Body Dressing: Maximal assistance;Sit to/from stand Lower Body Dressing Details (indicate cue type and reason): Pt not feeling well and not up to practicing with equipment. Toilet Transfer: Min guard;Ambulation;RW;Comfort height toilet;Grab bars Toilet Transfer Details (indicate cue type and reason): cues for hand placement Toileting- Clothing Manipulation and Hygiene: Supervision/safety;Sit to/from stand;Cueing for compensatory techniques Toileting - Clothing Manipulation Details (indicate cue type and reason): supervision to stand and clean self.     Functional mobility during ADLs: Min guard;Rolling walker General ADL Comments: Pt not feeling well today; very weak and not up to doing much. Pt required more assist sit to stand today than yesterday.       Vision   Vision Assessment?: No apparent visual deficits   Perception     Praxis  Cognition Arousal/Alertness: Awake/alert Behavior During Therapy: WFL for tasks assessed/performed Overall Cognitive Status: Within Functional Limits for tasks assessed                                  General Comments: Pt not feeling well this session.  Nauseous and weak        Exercises     Shoulder Instructions       General Comments Pt with decline in function today.  Not feeling well.    Pertinent Vitals/ Pain       Pain Assessment: Faces Faces Pain Scale: Hurts even more Pain Location: Back Pain Descriptors / Indicators: Operative site guarding;Grimacing Pain Intervention(s): Limited activity within patient's tolerance;Monitored during session;Repositioned  Home Living                                          Prior Functioning/Environment              Frequency  Min 3X/week        Progress Toward Goals  OT Goals(current goals can now be found in the care plan section)  Progress towards OT goals: Not progressing toward goals - comment(not feeling well today)  Acute Rehab OT Goals Patient Stated Goal: To go to son's home and avoid SNF OT Goal Formulation: With patient Time For Goal Achievement: 08/01/18 Potential to Achieve Goals: Good ADL Goals Pt Will Perform Grooming: with modified independence;standing Pt Will Perform Upper Body Dressing: with modified independence;sitting Pt Will Perform Lower Body Dressing: with supervision;with adaptive equipment;sit to/from stand Pt Will Transfer to Toilet: with modified independence;ambulating Pt Will Perform Toileting - Clothing Manipulation and hygiene: with modified independence;sit to/from stand;sitting/lateral leans Additional ADL Goal #1: Pt will perform log roll bed mobility at mod I level prior to engaging in ADL activity  Plan Discharge plan needs to be updated    Co-evaluation                 AM-PAC PT "6 Clicks" Daily Activity     Outcome Measure   Help from another person eating meals?: None Help from another person taking care of personal grooming?: A Little Help from another person toileting, which includes using toliet, bedpan, or urinal?: A Little Help  from another person bathing (including washing, rinsing, drying)?: A Lot Help from another person to put on and taking off regular upper body clothing?: A Little Help from another person to put on and taking off regular lower body clothing?: A Lot 6 Click Score: 17    End of Session Equipment Utilized During Treatment: Back brace;Rolling walker  OT Visit Diagnosis: Unsteadiness on feet (R26.81);Other abnormalities of gait and mobility (R26.89);Muscle weakness (generalized) (M62.81);Pain Pain - part of body: Leg   Activity Tolerance Patient limited by pain   Patient Left in bed;with call bell/phone within reach;with bed alarm set   Nurse Communication Mobility status;Precautions;Other (comment)(urine very dark)        Time: 1027-2536 OT Time Calculation (min): 20 min  Charges: OT General Charges $OT Visit: 1 Visit OT Treatments $Self Care/Home Management : 8-22 mins  Jinger Neighbors, OTR/L [phone]   Glenford Peers 07/19/2018, 11:58 AM

## 2018-07-20 MED ORDER — RIVAROXABAN 20 MG PO TABS
20.0000 mg | ORAL_TABLET | Freq: Every day | ORAL | Status: DC
  Administered 2018-07-20 – 2018-07-22 (×3): 20 mg via ORAL
  Filled 2018-07-20 (×3): qty 1

## 2018-07-20 MED ORDER — METHOCARBAMOL 500 MG PO TABS: 500.0000 mg | ORAL_TABLET | Freq: Four times a day (QID) | ORAL | 3 refills | Status: DC | PRN

## 2018-07-20 MED ORDER — DEXAMETHASONE 1 MG PO TABS: ORAL_TABLET | ORAL | 0 refills | Status: DC

## 2018-07-20 MED ORDER — HYDROCODONE-ACETAMINOPHEN 5-325 MG PO TABS: 1.0000 | ORAL_TABLET | ORAL | 0 refills | Status: DC | PRN

## 2018-07-20 NOTE — Care Management Important Message (Signed)
Important Message  Patient Details  Name: [name] MRN: [id number] Date of Birth: 04/13/1946   Medicare Important Message Given:  Yes    [name] 07/20/2018, 3:58 PM

## 2018-07-20 NOTE — Progress Notes (Signed)
Pt ambulated in the hallway with therapies.   Back in the room and in the chair.

## 2018-07-20 NOTE — Progress Notes (Signed)
Physician paged

## 2018-07-20 NOTE — Social Work (Addendum)
Attempted to see pt, pt working with PT- will return later to confirm pt preferred disposition.  11:25am- Spoke with pt PT Mickel Baas, pt still positively progressing and continues to want to d/c home with her son in [place]. Pt son works but pt daughter in law is home during the day and pt will have 24/7 assistance at home. Will inform RN case manager Almyra Free.   Westley Hummer, MSW, [place] Work [phone]

## 2018-07-20 NOTE — Progress Notes (Signed)
Pt wants to know when she will resume her xarelto.

## 2018-07-20 NOTE — Discharge Summary (Signed)
Physician Discharge Summary  Patient ID: [name] MRN: [id number] DOB/AGE: [birth year] 73 y.o.  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  History of decompression and fusion L4-L5.  Chronic anticoagulation. Active Problems:   Other spondylosis with radiculopathy, lumbar region   Discharged Condition: fair  Hospital Course: Patient was admitted to undergo surgical decompression using an anterolateral technique at L2-3 and L3-4.  She tolerated surgery well.  She did have significant weakness in her legs and stair walking took several days to recover.  She is discharged to the home of her son who will be taking care of her during the postoperative course. Consults: cardiology  Significant Diagnostic Studies: None  Treatments: None  Discharge Exam: Blood pressure 111/68, pulse 76, temperature 98.3 F (36.8 C), temperature source Oral, resp. rate 20, SpO2 96 %. Incisions are clean and dry Station and gait is intact.  Disposition:   Discharge Instructions    Incentive spirometry RT   Complete by:  As directed      Allergies as of 07/20/2018      Reactions   Codeine Nausea Only   Prednisone Hives, Other (See Comments)   In different places all over the body.   Sulfa Antibiotics Nausea Only   Sulfonamide Derivatives Nausea Only      Medication List    TAKE these medications   acetaminophen 325 MG tablet Commonly known as:  TYLENOL Take 650 mg by mouth every 6 (six) hours as needed for mild pain or headache.   albuterol 108 (90 Base) MCG/ACT inhaler Commonly known as:  PROVENTIL HFA;VENTOLIN HFA Inhale 2 puffs into the lungs every 6 (six) hours as needed for wheezing. What changed:  reasons  to take this   amoxicillin 500 MG capsule Commonly known as:  AMOXIL take 4 capsules by mouth 30-60 MINUTES PRIOR TO DENTAL APPT What changed:    how much to take  how to take this  when to take this  additional instructions   Biotin 10 MG Caps Take 10 mg by mouth daily.   BREO ELLIPTA 200-25 MCG/INH Aepb Generic drug:  fluticasone furoate-vilanterol INHALE 1 PUFF INTO THE LUNGS ONCE DAILY   CALCIUM 600 + D PO Take 1 tablet by mouth daily.   doxycycline 100 MG tablet Commonly known as:  VIBRA-TABS Take 1 tablet (100 mg total) by mouth 2 (two) times daily.   furosemide 40 MG tablet Commonly known as:  LASIX TAKE 1 TABLET BY MOUTH EVERY DAY 5 TIMES A WEEK What changed:  See the new instructions.   HYDROcodone-acetaminophen 5-325 MG tablet Commonly known as:  NORCO/VICODIN Take 1 tablet by mouth every 4 (four) hours as needed for severe pain ((score 7 to 10)).   ipratropium-albuterol 0.5-2.5 (3) MG/3ML Soln Commonly known as:  DUONEB Take 3 mLs by nebulization every 6 (six) hours as needed. What changed:  reasons to take this   lovastatin 40 MG tablet Commonly known as:  MEVACOR TAKE 1 TABLET BY MOUTH AT BEDTIME   methocarbamol 500 MG tablet Commonly known as:  ROBAXIN Take 1 tablet (500 mg total) by mouth every 6 (six) hours as needed for muscle spasms.   montelukast 10 MG tablet Commonly known as:  SINGULAIR TAKE 1 TABLET BY MOUTH AT BEDTIME   nitrofurantoin (macrocrystal-monohydrate) 100  MG capsule Commonly known as:  MACROBID Take 100 mg by mouth daily.   polycarbophil 625 MG tablet Commonly known as:  FIBERCON Take 625 mg by mouth daily.   polyethylene glycol packet Commonly known as:  MIRALAX / GLYCOLAX Take 17 g by mouth daily.   potassium chloride 10 MEQ tablet Commonly known as:  K-DUR TAKE 2 TABLETS BY MOUTH ONCE DAILY What changed:    when to take this  additional instructions   PRESERVISION AREDS 2 PO Take 2 capsules by mouth  daily.   REFRESH OP Place 2 drops into both eyes daily as needed (for dry eyes).   rivaroxaban 20 MG Tabs tablet Commonly known as:  XARELTO Take 1 tablet (20 mg total) by mouth daily with supper.   SPIRIVA HANDIHALER 18 MCG inhalation capsule Generic drug:  tiotropium INHALE THE CONTENTS OF ONE CAPSULE IN THE HANDIHALER ONCE DAILY What changed:  See the new instructions.   TOVIAZ 8 MG Tb24 tablet Generic drug:  fesoterodine Take 8 mg by mouth daily.   traMADol 50 MG tablet Commonly known as:  ULTRAM Take 0.5-1 tablets (25-50 mg total) by mouth 2 (two) times daily as needed for moderate pain.   vitamin B-12 500 MCG tablet Commonly known as:  CYANOCOBALAMIN Take 500 mcg by mouth daily.   Vitamin D 2000 units Caps Take 2,000 Units by mouth daily.   ZEASORB EX Apply 1 application topically daily.        SignedEarleen Newport 07/20/2018, 3:48 PM

## 2018-07-20 NOTE — Progress Notes (Signed)
Asked to clarify cardiac medications: - Bisoprolol 2.5 mg daily HAS BEEN STOPPED - Resume Xarelto 20 mg with evening meal daily - Resume Lovastatin 40 mg at bedtime daily - Resume furosemide 40 mg daily , 5 days a week - Resume KCl 20 mEq daily , 5 days a week  Sanda Klein, MD, [facility] [phone] office [phone] pager

## 2018-07-20 NOTE — Progress Notes (Signed)
Patient ID: [name], female   DOB: [birth year], 72 y.o.   MRN: [id number] Vital signs are stable Patient appears to be doing a bit better today than yesterday She was able to walk up 1 or 2 stairs today with PT She notes that at her son's house she will have to walk up approximately 6 stairs Hopefully a further recuperation occurs this week and she may be ready to go home on Sunday if not then possibly Monday.  I will prepare prescriptions for discharge medications.  We will restart her Xarelto.  She requested cardiologist see her regarding some of her medications.  She was told to stop her bisoprolol.  But that she may need to restart this.  I will ask cardiology to see her while she is here.

## 2018-07-20 NOTE — Progress Notes (Signed)
Physical Therapy Treatment Patient Details Name: [name] MRN: [id number] DOB: October 01, 1946 Today's Date: 07/20/2018    History of Present Illness [name] is a 72 y/o female who had significant back pain and leg pain and she is developed significant weakness in the left lower extremity with foot drop. Anterolateral decompression L2-3 L3-4 with XLIF allograft and infuse.  Lateral plate fixation K5-3 Z7-6. Pt has a PMH including Arthritis, Cataracts-bilateral, Chronic diastolic heart failure, COPD, DDD, Dyspnea, Essential HTN, Legally blind in left eye, Osteopenia, pacemaker, Rectal CA (04/2011), Rheumatic heart disease mitral stenosis, AVR and MVR (2015), Seizures.    PT Comments    Pt progressing towards physical therapy goals. Was able to negotiate 2 stairs this session with min guard to min assist due to one mild R knee buckle on descent. Will continue to follow and advance stair training as pt can tolerate. Pt continues to verbalize wish to return to son's home and avoid SNF if possible. With 24 hour assist and HHPT feel this patient will be safe at home, we just need to improve safety with stairs. If pt does return home, may want to consider a BSC to limit nighttime ambulation to/from bathroom as pt states she is up multiple times throughout the night to void.   Follow Up Recommendations  Home health PT;Supervision/Assistance - 24 hour     Equipment Recommendations  Rolling walker with 5" wheels, BSC   Recommendations for Other Services       Precautions / Restrictions Precautions Precautions: Fall;Back Precaution Booklet Issued: Yes (comment) Precaution Comments: Reviewed precautions and reviewed handout in detail Required Braces or Orthoses: Spinal Brace Spinal Brace: Lumbar corset;Applied in sitting position Restrictions Weight Bearing Restrictions: No    Mobility  Bed Mobility               General bed mobility comments: Pt was received sitting up in the  recliner chair.   Transfers Overall transfer level: Needs assistance Equipment used: Rolling walker (2 wheeled) Transfers: Sit to/from Stand Sit to Stand: Min guard         General transfer comment: Close guard for safety as pt powered up to full standing position. No unsteadiness or LOB noted.   Ambulation/Gait Ambulation/Gait assistance: Supervision;Min guard Gait Distance (Feet): 200 Feet Assistive device: Rolling walker (2 wheeled) Gait Pattern/deviations: Step-through pattern;Decreased stride length;Decreased dorsiflexion - left;Decreased weight shift to left;Trunk flexed Gait velocity: Decreased Gait velocity interpretation: 1.31 - 2.62 ft/sec, indicative of limited community ambulator General Gait Details: Pt continues to fatigue quickly but she was able to tolerate ~200 feet this session. VC's for improved posture and general safety with the RW.    Stairs Stairs: Yes Stairs assistance: Min assist;Min guard Stair Management: One rail Left;Step to pattern;Forwards Number of Stairs: 2 General stair comments: HHA on the R with railings use on the L. Pt was able to negotiate 2 sairs with min guard assist, however on descent, pt had minor knee buckle on the R and min assist provided to recover. Pt was able to descend the rest of the way with min guard assist only.    Wheelchair Mobility    Modified Rankin (Stroke Patients Only)       Balance Overall balance assessment: Needs assistance Sitting-balance support: Feet supported;No upper extremity supported Sitting balance-Leahy Scale: Fair     Standing balance support: Bilateral upper extremity supported;During functional activity Standing balance-Leahy Scale: Poor Standing balance comment: Reliant on UE support on walker for dynamic standing activity.  Cognition Arousal/Alertness: Awake/alert Behavior During Therapy: WFL for tasks assessed/performed Overall Cognitive Status:  Within Functional Limits for tasks assessed                                        Exercises      General Comments        Pertinent Vitals/Pain Pain Assessment: Faces Faces Pain Scale: Hurts little more Pain Location: Back Pain Descriptors / Indicators: Operative site guarding;Grimacing Pain Intervention(s): Monitored during session    Home Living                      Prior Function            PT Goals (current goals can now be found in the care plan section) Acute Rehab PT Goals Patient Stated Goal: Be able to walk to the bathroom unassisted. PT Goal Formulation: With patient Time For Goal Achievement: 07/25/18 Potential to Achieve Goals: Good Progress towards PT goals: Progressing toward goals    Frequency    Min 5X/week      PT Plan Current plan remains appropriate    Co-evaluation              AM-PAC PT "6 Clicks" Daily Activity  Outcome Measure  Difficulty turning over in bed (including adjusting bedclothes, sheets and blankets)?: Unable Difficulty moving from lying on back to sitting on the side of the bed? : Unable Difficulty sitting down on and standing up from a chair with arms (e.g., wheelchair, bedside commode, etc,.)?: Unable Help needed moving to and from a bed to chair (including a wheelchair)?: A Little Help needed walking in hospital room?: A Little Help needed climbing 3-5 steps with a railing? : A Little 6 Click Score: 12    End of Session Equipment Utilized During Treatment: Gait belt;Back brace Activity Tolerance: Patient tolerated treatment well Patient left: in chair;with call bell/phone within reach Nurse Communication: Mobility status PT Visit Diagnosis: Pain;Other symptoms and signs involving the nervous system (R29.898);Difficulty in walking, not elsewhere classified (R26.2) Pain - part of body: (back)     Time: 1007-1219 PT Time Calculation (min) (ACUTE ONLY): 20 min  Charges:  $Gait  Training: 8-22 mins                     Rolinda Roan, PT, DPT Acute Rehabilitation Services Pager: [phone] Office: [phone]    [name] 07/20/2018, 11:41 AM

## 2018-07-20 NOTE — Plan of Care (Signed)
  Problem: Education: Goal: Ability to verbalize activity precautions or restrictions will improve Outcome: Progressing Goal: Knowledge of the prescribed therapeutic regimen will improve Outcome: Progressing Goal: Understanding of discharge needs will improve Outcome: Progressing   Problem: Activity: Goal: Ability to avoid complications of mobility impairment will improve Outcome: Progressing Goal: Ability to tolerate increased activity will improve Outcome: Progressing Goal: Will remain free from falls Outcome: Progressing   Problem: Bowel/Gastric: Goal: Gastrointestinal status for postoperative course will improve Outcome: Progressing   Problem: Clinical Measurements: Goal: Ability to maintain clinical measurements within normal limits will improve Outcome: Progressing Goal: Postoperative complications will be avoided or minimized Outcome: Progressing Goal: Diagnostic test results will improve Outcome: Progressing   Problem: Pain Management: Goal: Pain level will decrease Outcome: Progressing   Problem: Health Behavior/Discharge Planning: Goal: Identification of resources available to assist in meeting health care needs will improve Outcome: Progressing

## 2018-07-21 NOTE — Progress Notes (Signed)
Spoke with Pharmacist, concerned about patient resuming xarelto with a history of mechanical valve replacement. States that warfarin is the preferred drug for her history.

## 2018-07-21 NOTE — Progress Notes (Signed)
Patient ID: [name], female   DOB: [birth year], 72 y.o.   MRN: [id number] BP 120/70 (BP Location: Right Arm)   Pulse 71   Temp 97.6 F (36.4 C) (Oral)   Resp 20   SpO2 96%  Alert and oriented x 4, speech is clear, fluent Moving lower extremities well Improving slowly, would like to go home monday

## 2018-07-21 NOTE — Progress Notes (Signed)
Physical Therapy Treatment Patient Details Name: [name] MRN: [id number] DOB: [birth year] Today's Date: 07/21/2018    History of Present Illness [name] is a 72 y/o female who had significant back pain and leg pain and she is developed significant weakness in the left lower extremity with foot drop. Anterolateral decompression L2-3 L3-4 with XLIF allograft and infuse.  Lateral plate fixation V4-0 G8-6. Pt has a PMH including Arthritis, Cataracts-bilateral, Chronic diastolic heart failure, COPD, DDD, Dyspnea, Essential HTN, Legally blind in left eye, Osteopenia, pacemaker, Rectal CA (04/2011), Rheumatic heart disease mitral stenosis, AVR and MVR (2015), Seizures.    PT Comments    Patient is progressing well towards physical therapy goals, ambulating up to 200 feet today, and navigates stairs with min assist/min guard without loss of balance or LE buckling. Does report that her pain is a bit worse today but overall feels that she is moving better and will be fine with her son at home. Patient will continue to benefit from skilled physical therapy services to further improve independence with functional mobility.     Follow Up Recommendations  Home health PT;Supervision/Assistance - 24 hour     Equipment Recommendations  Rolling walker with 5" wheels    Recommendations for Other Services       Precautions / Restrictions Precautions Precautions: Fall;Back Precaution Booklet Issued: Yes (comment) Precaution Comments: Reviewed precautions in detail with pt and she was cued for maintenance of precautions during functional mobility.  Required Braces or Orthoses: Spinal Brace Spinal Brace: Lumbar corset;Applied in sitting position Restrictions Weight Bearing Restrictions: No    Mobility  Bed Mobility               General bed mobility comments: Pt was received sitting on recliner  Transfers Overall transfer level: Needs assistance Equipment used: Rolling walker (2  wheeled) Transfers: Sit to/from Stand Sit to Stand: Supervision         General transfer comment: Good power up from recliner, good control with stand to sit transition. Supervision for safety only.  Ambulation/Gait Ambulation/Gait assistance: Supervision Gait Distance (Feet): 200 Feet Assistive device: Rolling walker (2 wheeled) Gait Pattern/deviations: Step-through pattern;Decreased stride length;Decreased dorsiflexion - left;Decreased weight shift to left;Trunk flexed Gait velocity: Decreased   General Gait Details: VC for upright stance, walker proximity, and gait symmetry. No buckling or over instability noted while using RW for moderate support. Supervision for safety throughout.   Stairs Stairs: Yes Stairs assistance: Min assist;Min guard Stair Management: One rail Left;Step to pattern;Sideways;With walker Number of Stairs: 3(x2) General stair comments: Practiced with rail min guard, cues for technique. Practiced with RW, posterior approach as she was not sure if there is a rail at home. Min assist only for RW blcok, no buckling of LEs noted.   Wheelchair Mobility    Modified Rankin (Stroke Patients Only)       Balance Overall balance assessment: Needs assistance Sitting-balance support: Feet supported;No upper extremity supported Sitting balance-Leahy Scale: Fair     Standing balance support: During functional activity;No upper extremity supported Standing balance-Leahy Scale: Fair                              Cognition Arousal/Alertness: Awake/alert Behavior During Therapy: WFL for tasks assessed/performed Overall Cognitive Status: Within Functional Limits for tasks assessed  Exercises      General Comments        Pertinent Vitals/Pain Pain Assessment: Faces Faces Pain Scale: Hurts even more Pain Location: back and hips Pain Descriptors / Indicators: Operative site  guarding;Grimacing    Home Living Family/patient expects to be discharged to:: Private residence Living Arrangements: Alone Available Help at Discharge: Family;Available 24 hours/day Type of Home: House Home Access: Stairs to enter   Home Layout: One level Home Equipment: Grab bars - tub/shower;Shower seat - built in;Walker - 4 wheels Additional Comments: Above information is for son's home. She is planning to d/c there and will have daughter-in-law available 24 hours for support. Reports she has meals prepped and church friends to assist with grocery shopping as needed as well.     Prior Function Level of Independence: Independent with assistive device(s)      Comments: Using rollator    PT Goals (current goals can now be found in the care plan section) Acute Rehab PT Goals Patient Stated Goal: To go to son's home and avoid SNF PT Goal Formulation: With patient Time For Goal Achievement: 07/25/18 Potential to Achieve Goals: Good Progress towards PT goals: Progressing toward goals    Frequency    Min 5X/week      PT Plan Current plan remains appropriate    Co-evaluation              AM-PAC PT "6 Clicks" Daily Activity  Outcome Measure  Difficulty turning over in bed (including adjusting bedclothes, sheets and blankets)?: Unable Difficulty moving from lying on back to sitting on the side of the bed? : Unable Difficulty sitting down on and standing up from a chair with arms (e.g., wheelchair, bedside commode, etc,.)?: Unable Help needed moving to and from a bed to chair (including a wheelchair)?: None Help needed walking in hospital room?: None Help needed climbing 3-5 steps with a railing? : A Little 6 Click Score: 14    End of Session Equipment Utilized During Treatment: Gait belt;Back brace Activity Tolerance: Patient tolerated treatment well Patient left: in chair;with call bell/phone within reach;with nursing/sitter in room Nurse Communication: Mobility  status PT Visit Diagnosis: Pain;Other symptoms and signs involving the nervous system (R29.898);Difficulty in walking, not elsewhere classified (R26.2) Pain - part of body: (back)     Time: 3846-6599 PT Time Calculation (min) (ACUTE ONLY): 13 min  Charges:  $Gait Training: 8-22 mins                     Elayne Snare, PT, DPT   Ellouise Newer 07/21/2018, 12:35 PM

## 2018-07-22 MED ORDER — FUROSEMIDE 40 MG PO TABS
40.0000 mg | ORAL_TABLET | ORAL | Status: DC
  Administered 2018-07-22: 40 mg via ORAL
  Filled 2018-07-22: qty 1

## 2018-07-22 MED ORDER — FUROSEMIDE 40 MG PO TABS: 40.0000 mg | ORAL_TABLET | ORAL | Status: DC

## 2018-07-22 NOTE — Progress Notes (Signed)
Occupational Therapy Treatment Patient Details Name: [name] MRN: [id number] DOB: 1946-05-17 Today's Date: 07/22/2018    History of present illness [name] is a 73 y/o female who had significant back pain and leg pain and she is developed significant weakness in the left lower extremity with foot drop. Anterolateral decompression L2-3 L3-4 with XLIF allograft and infuse.  Lateral plate fixation L7-9 G9-2. Pt has a PMH including Arthritis, Cataracts-bilateral, Chronic diastolic heart failure, COPD, DDD, Dyspnea, Essential HTN, Legally blind in left eye, Osteopenia, pacemaker, Rectal CA (04/2011), Rheumatic heart disease mitral stenosis, AVR and MVR (2015), Seizures.   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  Able to return demo of LB dressing tasks.  Reports having a reacher to use if needed.  Declines need for any other A/E.    Follow Up Recommendations  SNF;Supervision/Assistance - 24 hour    Equipment Recommendations  None recommended by OT    Recommendations for Other Services      Precautions / Restrictions Precautions Precautions: Fall;Back Required Braces or Orthoses: Spinal Brace Spinal Brace: Lumbar corset;Applied in sitting position       Mobility Bed Mobility               General bed mobility comments: sitting in recliner at beg. and end of session  Transfers                      Balance                                           ADL either performed or assessed with clinical judgement   ADL Overall ADL's : Needs assistance/impaired               Lower Body Bathing Details (indicate cue type and reason): able to perform figure 4 now to reach b les       Lower Body Dressing Details (indicate cue type and reason): able to perform figure 4 now for reaching B LEs for don/doff socks, pants.  reports she has a reacher if needed.  reviewed how to use reacher for donning pants and  underwear. states she is not interested "in any of those other plastic looking pieces"               General ADL Comments: pt. returned demo x2 for LB dressing with no A/E needs. states she "HAS to be able to don underwear" reports this is very imp. to her and then she will have help as needed for any other lb dressing but would not want help for her undergarments.  able to complete this task today.  has reacher if needed, and delcined interest in any of the other pieces of a/e     Vision       Perception     Praxis      Cognition Arousal/Alertness: Awake/alert Behavior During Therapy: WFL for tasks assessed/performed Overall Cognitive Status: Within Functional Limits for tasks assessed                                          Exercises     Shoulder Instructions       General Comments      Pertinent Vitals/ Pain  Pain Assessment: Faces Faces Pain Scale: Hurts little more Pain Location: B LES duing LB dressing tasks  Pain Descriptors / Indicators: Grimacing  Home Living                                          Prior Functioning/Environment              Frequency           Progress Toward Goals  OT Goals(current goals can now be found in the care plan section)  Progress towards OT goals: Progressing toward goals     Plan      Co-evaluation                 AM-PAC PT "6 Clicks" Daily Activity     Outcome Measure   Help from another person eating meals?: None Help from another person taking care of personal grooming?: A Little Help from another person toileting, which includes using toliet, bedpan, or urinal?: A Little Help from another person bathing (including washing, rinsing, drying)?: A Lot Help from another person to put on and taking off regular upper body clothing?: A Little Help from another person to put on and taking off regular lower body clothing?: A Lot 6 Click Score: 17    End of  Session    OT Visit Diagnosis: Unsteadiness on feet (R26.81);Other abnormalities of gait and mobility (R26.89);Muscle weakness (generalized) (M62.81);Pain Pain - Right/Left: Left Pain - part of body: Leg   Activity Tolerance Patient tolerated treatment well   Patient Left in chair;with call bell/phone within reach;with nursing/sitter in room   Nurse Communication          Time: 0962-8366 OT Time Calculation (min): 11 min  Charges: OT General Charges $OT Visit: 1 Visit OT Treatments $Self Care/Home Management : 8-22 mins   [name], COTA/L 07/22/2018, 9:55 AM

## 2018-07-22 NOTE — Progress Notes (Signed)
Patient ID: [name], female   DOB: [birth year], 72 y.o.   MRN: [id number] BP 118/74   Pulse 73   Temp (!) 97.5 F (36.4 C) (Oral)   Resp 16   SpO2 98%  Alert and oriented x 4, speech is clear and fluent Moving all extremities well Restarted lasix Discharge tomorrow

## 2018-07-23 LAB — CBC
HCT: 41.1 % (ref 36.0–46.0)
Hemoglobin: 13.1 g/dL (ref 12.0–15.0)
MCH: 32.5 pg (ref 26.0–34.0)
MCHC: 31.9 g/dL (ref 30.0–36.0)
MCV: 102 fL — ABNORMAL HIGH (ref 80.0–100.0)
Platelets: 156 10*3/uL (ref 150–400)
RBC: 4.03 MIL/uL (ref 3.87–5.11)
RDW: 14.4 % (ref 11.5–15.5)
WBC: 6.8 10*3/uL (ref 4.0–10.5)
nRBC: 0 % (ref 0.0–0.2)

## 2018-07-23 NOTE — Care Management Note (Signed)
Case Management Note  Patient Details  Name: [name] MRN: [id number] Date of Birth: [birth year]  Subjective/Objective:  [name] is a 73 y/o female who had significant back pain and leg pain, developing significant weakness in the left lower extremity with foot drop.  On 07/17/18 she had  anterolateral decompression of L2-3 L3-4 with XLIF allograft and lateral plate fixation of N8-2 L3-4.  PTA, pt independent with assistive device; lives at home alone.               Action/Plan: PT/OT recommending HH follow up and DME for home.  Pt discharging to her son's home at [facility] in [place], [place], [address].  Pt prefers [facility] for [facility] services, but they do not service this area.  Her second choice is [facility]. Referral to [facility] with [facility]; start of care 24-48h post dc date.  Referral to [facility] for DME needs.    Expected Discharge Date:  07/23/18               Expected Discharge Plan:  [place]  In-House Referral:     Discharge planning Services  CM Consult  Post Acute Care Choice:  Home Health Choice offered to:  Patient  DME Arranged:  3-N-1, Walker rolling DME Agency:  [place] Arranged:  PT, OT [facility] Agency:  [place]  Status of Service:  Completed, signed off  If discussed at [place] of Stay Meetings, dates discussed:    Additional Comments:  Reinaldo Raddle, RN, BSN  Trauma/Neuro ICU Case Manager [phone]

## 2018-07-23 NOTE — Progress Notes (Signed)
Physical Therapy Treatment Patient Details Name: [name] MRN: [id number] DOB: [birth year] Today's Date: 07/23/2018    History of Present Illness [name] is a 72 y/o female who had significant back pain and leg pain and she is developed significant weakness in the left lower extremity with foot drop. Anterolateral decompression L2-3 L3-4 with XLIF allograft and infuse.  Lateral plate fixation K4-4 W1-0. Pt has a PMH including Arthritis, Cataracts-bilateral, Chronic diastolic heart failure, COPD, DDD, Dyspnea, Essential HTN, Legally blind in left eye, Osteopenia, pacemaker, Rectal CA (04/2011), Rheumatic heart disease mitral stenosis, AVR and MVR (2015), Seizures.    PT Comments    Pt reporting increased burning sensation in BLE's however states L is worse than R. She was not able to tolerate distance to the stairs or stress of stair training this session. Pt is convinced pain is due to swelling in feet/ankles. Pt continues to position herself upright in chair with LE's out at a 90 angle on the leg rest. Educated on preferred positioning and potential increased stress on lumbar spine in this position, however pt states she is more comfortable this way and wants to have feet elevated due to swelling. Pt was instructed in HEP for ankle mobility to strengthen DF in L ankle, and decrease edema bilaterally. Will continue to follow and progress as able per POC.    Follow Up Recommendations  Home health PT;Supervision/Assistance - 24 hour     Equipment Recommendations  Rolling walker with 5" wheels;3in1 (PT)    Recommendations for Other Services       Precautions / Restrictions Precautions Precautions: Fall;Back Precaution Booklet Issued: Yes (comment) Precaution Comments: Reviewed precautions in detail with pt and she was cued for maintenance of precautions during functional mobility.  Required Braces or Orthoses: Spinal Brace Spinal Brace: Lumbar corset;Applied in sitting  position Restrictions Weight Bearing Restrictions: No    Mobility  Bed Mobility               General bed mobility comments: Pt sitting up in recliner upon PT arrival.   Transfers Overall transfer level: Needs assistance Equipment used: Rolling walker (2 wheeled) Transfers: Sit to/from Stand Sit to Stand: Supervision         General transfer comment: No assist required. Pt demonstrated proper hand placement on seated surface, and supervision provided for safety. Pt required increased time and demonstrated flexed posture due to pain.   Ambulation/Gait Ambulation/Gait assistance: Min guard Gait Distance (Feet): 120 Feet Assistive device: Rolling walker (2 wheeled) Gait Pattern/deviations: Step-through pattern;Decreased stride length;Decreased dorsiflexion - left;Decreased weight shift to left;Trunk flexed Gait velocity: Decreased Gait velocity interpretation: 1.31 - 2.62 ft/sec, indicative of limited community ambulator General Gait Details: VC for upright stance, walker proximity, and gait symmetry. No buckling or over instability noted while using RW for moderate support. Several standing rest breaks required due to reported burning in LE's (L worse than R). Pt flexing trunk to alleviate pain however was cued for overall improved posture.    Stairs         General stair comments: Plan was to practice stairs again however pt was not able to complete due to pain.    Wheelchair Mobility    Modified Rankin (Stroke Patients Only)       Balance Overall balance assessment: Needs assistance Sitting-balance support: Feet supported;No upper extremity supported Sitting balance-Leahy Scale: Fair     Standing balance support: During functional activity;No upper extremity supported Standing balance-Leahy Scale: Fair Standing balance comment: Reliant  on UE support on walker for dynamic standing activity.                             Cognition  Arousal/Alertness: Awake/alert Behavior During Therapy: WFL for tasks assessed/performed Overall Cognitive Status: Within Functional Limits for tasks assessed                                        Exercises Other Exercises Other Exercises: Pt was instructed in LAQ (with decrease in range if feeling a pull in the back), quad sets, ankle pumps, seated toe raises and seated heel raises. Printed handout provided.     General Comments General comments (skin integrity, edema, etc.): Pt with decline in function today due to increased pain.       Pertinent Vitals/Pain Pain Assessment: Faces Faces Pain Scale: Hurts whole lot Pain Location: BLE's L worse than R with standing activity Pain Descriptors / Indicators: Burning;Grimacing Pain Intervention(s): Limited activity within patient's tolerance;Monitored during session;Repositioned    Home Living                      Prior Function            PT Goals (current goals can now be found in the care plan section) Acute Rehab PT Goals Patient Stated Goal: To go to son's home and avoid SNF PT Goal Formulation: With patient Time For Goal Achievement: 07/25/18 Potential to Achieve Goals: Good Progress towards PT goals: Progressing toward goals    Frequency    Min 5X/week      PT Plan Current plan remains appropriate    Co-evaluation              AM-PAC PT "6 Clicks" Daily Activity  Outcome Measure  Difficulty turning over in bed (including adjusting bedclothes, sheets and blankets)?: A Little Difficulty moving from lying on back to sitting on the side of the bed? : A Little Difficulty sitting down on and standing up from a chair with arms (e.g., wheelchair, bedside commode, etc,.)?: A Little Help needed moving to and from a bed to chair (including a wheelchair)?: A Little Help needed walking in hospital room?: A Little Help needed climbing 3-5 steps with a railing? : A Little 6 Click Score:  18    End of Session Equipment Utilized During Treatment: Gait belt;Back brace Activity Tolerance: Patient limited by pain Patient left: in chair;with call bell/phone within reach;with nursing/sitter in room Nurse Communication: Mobility status PT Visit Diagnosis: Pain;Other symptoms and signs involving the nervous system (R29.898);Difficulty in walking, not elsewhere classified (R26.2)     Time: 0488-8916 PT Time Calculation (min) (ACUTE ONLY): 29 min  Charges:  $Gait Training: 8-22 mins $Therapeutic Exercise: 8-22 mins                     Rolinda Roan, PT, DPT Acute Rehabilitation Services Pager: [phone] Office: [phone]    [name] 07/23/2018, 8:27 AM

## 2018-07-23 NOTE — Progress Notes (Signed)
Pt expecting to be d/c today. Son driving 3 hrs to get pt. Called MD office about discharge paperwork. Awaiting call back

## 2018-07-23 NOTE — Progress Notes (Signed)
Pt with d/c orders. IV removed. Discharge paperwork reviewed with pt and son and all questions answered. Hand prescriptions, walker, and BSC with pt at time of d/c. Pt has 3-4 hr ride, advised pt to stop every 52min-1hr to stretch. Pt escorted out to vehicle via wheelchair.

## 2018-07-26 ENCOUNTER — Telehealth: Payer: Self-pay | Admitting: Cardiovascular Disease

## 2018-07-26 NOTE — Telephone Encounter (Signed)
Returned call to patient, she states she had back surgery last week and was discharged Monday without her fluid pill (furosemide).  Her feet are very swollen and she was wondering if she should restart.     Denies SOB, only swelling in her feet.    Advised per chart review, note on 10/11 per Dr. Sallyanne Kuster.  Asked to clarify cardiac medications: - Bisoprolol 2.5 mg daily HAS BEEN STOPPED - Resume Xarelto 20 mg with evening meal daily - Resume Lovastatin 40 mg at bedtime daily - Resume furosemide 40 mg daily , 5 days a week - Resume KCl 20 mEq daily , 5 days a week  Sanda Klein, MD, [facility] [phone] office [phone] pager    She is aware to restart furosemide and K as instructed, advised to elevated LE and call if this does not resolve.     Patient aware and verbalized understanding.

## 2018-07-26 NOTE — Telephone Encounter (Signed)
New Message    Pt c/o medication issue:  1. Name of Medication: furosemide (LASIX) 40 MG tablet  2. How are you currently taking this medication (dosage and times per day)?   3. Are you having a reaction (difficulty breathing--STAT)?   4. What is your medication issue? Patient is calling because she had a procedure and was taken off her fluid pill. She wants to know when to restart and at what dose.

## 2018-07-26 NOTE — Telephone Encounter (Signed)
TY

## 2018-08-01 ENCOUNTER — Telehealth: Payer: Self-pay

## 2018-08-01 ENCOUNTER — Encounter: Payer: Medicare Other | Admitting: *Deleted

## 2018-08-01 NOTE — Telephone Encounter (Signed)
LMOVM reminding pt to send remote transmission.   

## 2018-08-02 ENCOUNTER — Encounter: Payer: Self-pay | Admitting: Cardiology

## 2018-08-02 ENCOUNTER — Encounter: Payer: Medicare Other | Admitting: *Deleted

## 2018-08-10 ENCOUNTER — Telehealth: Payer: Self-pay

## 2018-08-10 NOTE — Telephone Encounter (Signed)
Caprice Red OT with [facility] request OT 1 x a week for 2 weeks for exercise, IADL and transfers. Please advise.

## 2018-08-12 NOTE — Telephone Encounter (Signed)
Agree with this. Thanks.  

## 2018-08-13 NOTE — Telephone Encounter (Signed)
Lm on Don's vm and provided verbal orders

## 2018-08-21 DIAGNOSIS — M5136 Other intervertebral disc degeneration, lumbar region: Secondary | ICD-10-CM

## 2018-08-21 DIAGNOSIS — K219 Gastro-esophageal reflux disease without esophagitis: Secondary | ICD-10-CM

## 2018-08-21 DIAGNOSIS — M47896 Other spondylosis, lumbar region: Secondary | ICD-10-CM

## 2018-08-21 DIAGNOSIS — I050 Rheumatic mitral stenosis: Secondary | ICD-10-CM

## 2018-08-21 DIAGNOSIS — M5030 Other cervical disc degeneration, unspecified cervical region: Secondary | ICD-10-CM

## 2018-08-21 DIAGNOSIS — M21372 Foot drop, left foot: Secondary | ICD-10-CM

## 2018-08-21 DIAGNOSIS — J449 Chronic obstructive pulmonary disease, unspecified: Secondary | ICD-10-CM

## 2018-08-21 DIAGNOSIS — M48062 Spinal stenosis, lumbar region with neurogenic claudication: Secondary | ICD-10-CM

## 2018-08-21 DIAGNOSIS — G4733 Obstructive sleep apnea (adult) (pediatric): Secondary | ICD-10-CM

## 2018-08-21 DIAGNOSIS — I5032 Chronic diastolic (congestive) heart failure: Secondary | ICD-10-CM

## 2018-08-21 DIAGNOSIS — Z9049 Acquired absence of other specified parts of digestive tract: Secondary | ICD-10-CM

## 2018-08-21 DIAGNOSIS — Z4789 Encounter for other orthopedic aftercare: Secondary | ICD-10-CM

## 2018-08-21 DIAGNOSIS — R569 Unspecified convulsions: Secondary | ICD-10-CM

## 2018-08-21 DIAGNOSIS — G4700 Insomnia, unspecified: Secondary | ICD-10-CM

## 2018-08-21 DIAGNOSIS — Z954 Presence of other heart-valve replacement: Secondary | ICD-10-CM

## 2018-08-21 DIAGNOSIS — E785 Hyperlipidemia, unspecified: Secondary | ICD-10-CM

## 2018-08-21 DIAGNOSIS — Z87440 Personal history of urinary (tract) infections: Secondary | ICD-10-CM

## 2018-08-21 DIAGNOSIS — Z9181 History of falling: Secondary | ICD-10-CM

## 2018-08-21 DIAGNOSIS — Z85038 Personal history of other malignant neoplasm of large intestine: Secondary | ICD-10-CM

## 2018-08-21 DIAGNOSIS — Z6822 Body mass index (BMI) 22.0-22.9, adult: Secondary | ICD-10-CM

## 2018-08-21 DIAGNOSIS — H5462 Unqualified visual loss, left eye, normal vision right eye: Secondary | ICD-10-CM

## 2018-08-21 DIAGNOSIS — Z87891 Personal history of nicotine dependence: Secondary | ICD-10-CM

## 2018-08-21 DIAGNOSIS — M8588 Other specified disorders of bone density and structure, other site: Secondary | ICD-10-CM

## 2018-08-21 DIAGNOSIS — Z950 Presence of cardiac pacemaker: Secondary | ICD-10-CM

## 2018-08-21 DIAGNOSIS — I110 Hypertensive heart disease with heart failure: Secondary | ICD-10-CM

## 2018-08-21 DIAGNOSIS — Z7901 Long term (current) use of anticoagulants: Secondary | ICD-10-CM

## 2018-08-21 DIAGNOSIS — Z8701 Personal history of pneumonia (recurrent): Secondary | ICD-10-CM

## 2018-08-21 DIAGNOSIS — Z952 Presence of prosthetic heart valve: Secondary | ICD-10-CM

## 2018-08-21 DIAGNOSIS — M4310 Spondylolisthesis, site unspecified: Secondary | ICD-10-CM

## 2018-08-21 DIAGNOSIS — E669 Obesity, unspecified: Secondary | ICD-10-CM

## 2018-08-21 DIAGNOSIS — Z792 Long term (current) use of antibiotics: Secondary | ICD-10-CM

## 2018-09-17 ENCOUNTER — Telehealth: Payer: Self-pay | Admitting: Internal Medicine

## 2018-09-17 MED ORDER — TIOTROPIUM BROMIDE MONOHYDRATE 18 MCG IN CAPS: ORAL_CAPSULE | RESPIRATORY_TRACT | 5 refills | Status: DC

## 2018-09-17 NOTE — Telephone Encounter (Signed)
Spoke with pt, requesting spiriva rx.  This has been sent to preferred pharmacy.  Nothing further needed.

## 2018-10-10 DIAGNOSIS — S32010A Wedge compression fracture of first lumbar vertebra, initial encounter for closed fracture: Secondary | ICD-10-CM | POA: Insufficient documentation

## 2018-10-14 ENCOUNTER — Other Ambulatory Visit: Payer: Self-pay | Admitting: Cardiology

## 2018-10-14 DIAGNOSIS — I480 Paroxysmal atrial fibrillation: Secondary | ICD-10-CM

## 2018-10-15 NOTE — Telephone Encounter (Signed)
Pt is a 73 yr old female who saw Dr. Curt Bears on 05/30/18. Last noted weight on 06/27/18 was 67.6Kg. SCr on 07/18/18 was 0.58, CrCl is 92 mL/min will refill Xarelto 20mg  QD.

## 2018-10-29 ENCOUNTER — Telehealth: Payer: Self-pay | Admitting: Cardiology

## 2018-10-29 NOTE — Telephone Encounter (Signed)
Follow Up:     Pt said she talked to somebody this morning, she could not remember who it was. She said she was supposed to have called and ask her Dermatologist about stopping her Xarelto. She said he said  it was not his call, her Cardiologist need to make that decision. She is sup;posed to have a cyst removed from under her right arm by her Dermatologist in his office. If she is not at home, please call [phone].

## 2018-10-29 NOTE — Telephone Encounter (Signed)
Returned call to patient she stated she is scheduled to have a cyst removed from under right arm 11/14/18..Stated her dermatologist Dr.Amy Martinique advised he to call our office to see if ok to hold Xarelto and how long to hold Xarelto.Message sent to pharmacist for advice.

## 2018-10-30 NOTE — Telephone Encounter (Signed)
Returned call to patient [name]'s recommendation given.

## 2018-10-30 NOTE — Telephone Encounter (Signed)
Patient with diagnosis of A FIB on XARELTO for anticoagulation.    Procedure: CYST REMOVAL Date of procedure: 11/14/2018  CHADS2-VASc score of  4 (CHF, HTN?, AGE X 2, DM2, stroke/tia x 2, CAD, female)  CrCl = 45ML/MIN   Per office protocol, patient can hold XARELTO for 1 day prior to procedure.     PLEASE RESUME XARELTO 20MG  DAILY AS SOON AS POSSIBLE AFTER PROCEDURE.

## 2018-11-01 ENCOUNTER — Ambulatory Visit (INDEPENDENT_AMBULATORY_CARE_PROVIDER_SITE_OTHER): Payer: Medicare Other

## 2018-11-01 DIAGNOSIS — I480 Paroxysmal atrial fibrillation: Secondary | ICD-10-CM

## 2018-11-01 DIAGNOSIS — I441 Atrioventricular block, second degree: Secondary | ICD-10-CM

## 2018-11-02 ENCOUNTER — Encounter: Payer: Self-pay | Admitting: Cardiology

## 2018-11-02 NOTE — Progress Notes (Signed)
Remote pacemaker transmission.   

## 2018-11-04 LAB — CUP PACEART REMOTE DEVICE CHECK
Battery Remaining Longevity: 121 mo
Battery Remaining Percentage: 95.5 %
Battery Voltage: 3.01 V
Brady Statistic AP VP Percent: 0 %
Brady Statistic AP VS Percent: 0 %
Brady Statistic AS VP Percent: 0 %
Brady Statistic AS VS Percent: 0 %
Brady Statistic RA Percent Paced: 1 %
Brady Statistic RV Percent Paced: 99 %
Date Time Interrogation Session: 20200123151448
Implantable Lead Implant Date: 20170907
Implantable Lead Implant Date: 20170907
Implantable Lead Location: 753859
Implantable Lead Location: 753860
Implantable Pulse Generator Implant Date: 20170907
Lead Channel Impedance Value: 400 Ohm
Lead Channel Impedance Value: 530 Ohm
Lead Channel Pacing Threshold Amplitude: 0.5 V
Lead Channel Pacing Threshold Amplitude: 0.5 V
Lead Channel Pacing Threshold Pulse Width: 0.5 ms
Lead Channel Pacing Threshold Pulse Width: 0.5 ms
Lead Channel Sensing Intrinsic Amplitude: 12 mV
Lead Channel Sensing Intrinsic Amplitude: 2.4 mV
Lead Channel Setting Pacing Amplitude: 0.75 V
Lead Channel Setting Pacing Amplitude: 2 V
Lead Channel Setting Pacing Pulse Width: 0.5 ms
Lead Channel Setting Sensing Sensitivity: 2 mV
Pulse Gen Model: 2272
Pulse Gen Serial Number: 7945283

## 2018-11-08 ENCOUNTER — Telehealth: Payer: Self-pay | Admitting: *Deleted

## 2018-11-08 NOTE — Telephone Encounter (Signed)
-----   Message from Will Meredith Leeds, MD sent at 11/05/2018  5:02 PM EST ----- Abnormal device interrogation reviewed.  Lead parameters and battery status stable.  Persistent atrial fibrillation set for September.  If symptomatic, needs to return to clinic to discuss rhythm control.

## 2018-11-08 NOTE — Telephone Encounter (Signed)
Per phone note from 07/06/18, patient not symptomatic with persistent AF at that time.  LMOM requesting call back to the DC. Gave direct number for return call.

## 2018-11-09 NOTE — Telephone Encounter (Signed)
LMOVM for pt to return call 

## 2018-11-09 NOTE — Telephone Encounter (Signed)
Follow up   Patient is returning call. Please call [phone].

## 2018-11-09 NOTE — Telephone Encounter (Signed)
S/w pt and advised Trinidad Curet, RN has received the message from [facility] today. I advised her per Dr. Curt Bears that she will need an appt. Pt said she is scheduled for 11/23/18 already. I explained to her that I feel that Dr. Curt Bears is wanting to bring her in sooner. Pt said she would like a sooner appt as well. I advised pt per Trinidad Curet, RN she will call the pt next week to set up appt. Pt thanked me for the call.

## 2018-11-09 NOTE — Telephone Encounter (Signed)
Patient called back. I informed her that Dr. Curt Bears wanted to know if she was symptomatic in September. She stated that she can not remember back to September. Informed pt that I wound send Device Tech RN the note and if they have any additional questions they will call back. Pt verbalized understanding.

## 2018-11-09 NOTE — Telephone Encounter (Signed)
Needs follow up in clinic to discuss persistent atrial fibrillation.

## 2018-11-09 NOTE — Telephone Encounter (Signed)
Patient called back and I asked her the following question.  1.) Have you had an increased Shortness of Breath? Patient stated that she has not had an increase in shortness of breath.  2.) Any leg or abdominal swelling? Pt stated no swelling.  3.) Palpitations? She stated that she has felt some but not a lot and not often.   4.) Dizziness? She stated that she was dizzy some last summer but that has resolved for the most part.   Informed pt that I would send message to Device Tech RN. Pt verbalized understanding.

## 2018-11-12 NOTE — Telephone Encounter (Signed)
Informed pt that d/t the fact she does not have symptoms currently, it is ok to wait and see Dr. Curt Bears next Friday as she is scheduled.  She does not need a sooner appointment, per Dr. Curt Bears. Pt agreeable to plan and will call the office if she begins to experience SE/symptoms.

## 2018-11-13 ENCOUNTER — Other Ambulatory Visit: Payer: Self-pay | Admitting: Cardiovascular Disease

## 2018-11-14 NOTE — Telephone Encounter (Signed)
Rx request sent to pharmacy.  

## 2018-11-19 ENCOUNTER — Ambulatory Visit: Payer: Medicare Other

## 2018-11-23 ENCOUNTER — Encounter: Payer: Self-pay | Admitting: Cardiology

## 2018-11-23 ENCOUNTER — Ambulatory Visit (INDEPENDENT_AMBULATORY_CARE_PROVIDER_SITE_OTHER): Payer: Medicare Other | Admitting: Cardiology

## 2018-11-23 VITALS — BP 114/66 | HR 70 | Ht 66.0 in | Wt 144.0 lb

## 2018-11-23 DIAGNOSIS — I483 Typical atrial flutter: Secondary | ICD-10-CM

## 2018-11-23 DIAGNOSIS — Z01812 Encounter for preprocedural laboratory examination: Secondary | ICD-10-CM | POA: Diagnosis not present

## 2018-11-23 DIAGNOSIS — I480 Paroxysmal atrial fibrillation: Secondary | ICD-10-CM | POA: Diagnosis not present

## 2018-11-23 NOTE — Patient Instructions (Addendum)
Medication Instructions:  Your physician recommends that you continue on your current medications as directed. Please refer to the Current Medication list given to you today.  *If you need a refill on your cardiac medications before your next appointment, please call your pharmacy*  Labwork: Pre procedure labs today: BMET & CBC  Testing/Procedures: Your physician has recommended that you have a Cardioversion (DCCV). Electrical Cardioversion uses a jolt of electricity to your heart either through paddles or wired patches attached to your chest. This is a controlled, usually prescheduled, procedure. Defibrillation is done under light anesthesia in the hospital, and you usually go home the day of the procedure. This is done to get your heart back into a normal rhythm. You are not awake for the procedure. Please see the instruction sheet given to you today.  CARDIOVERSION INSTRUCTIONS: Please arrive at the [facility] main entrance of [facility] hospital on 12/10/2018 at  9:30 A.M. Do not eat or drink after midnight prior to procedure Hold your Lasix the morning of procedure Take your remaining medications as normal the morning of your procedure with a sip of water. You will need someone to drive you home at discharge   Follow-Up: Remote monitoring is used to monitor your Pacemaker or ICD from home. This monitoring reduces the number of office visits required to check your device to one time per year. It allows [place] to keep an eye on the functioning of your device to ensure it is working properly. You are scheduled for a device check from home on 01/31/2019. You may send your transmission at any time that day. If you have a wireless device, the transmission will be sent automatically. After your physician reviews your transmission, you will receive a postcard with your next transmission date.  Your physician recommends that you schedule a follow-up appointment in: 3 months with Dr. Curt Bears.  Thank  you for choosing [facility]!!   Trinidad Curet, RN [phone]

## 2018-11-23 NOTE — Addendum Note (Signed)
Addended by: Stanton Kidney on: 11/23/2018 02:46 PM   Modules accepted: Orders

## 2018-11-23 NOTE — H&P (View-Only) (Signed)
Electrophysiology Office Note   Date:  11/23/2018   ID:  Diane, [name] 1946-01-12, MRN [id number]  PCP:  Ria Bush, MD  Cardiologist:  [facility] Primary Electrophysiologist:  Hicks Feick Meredith Leeds, MD    No chief complaint on file.    History of Present Illness: [name] is a 73 y.o. female who presents today for electrophysiology evaluation.   She presented to the hospital in September 2017 with progressive exercise intolerance and shortness of breath and was found to be in 2:1 AV block. She has St. Jude dual-chamber pacemaker implanted at that time.   Today, denies symptoms of palpitations, chest pain, shortness of breath, orthopnea, PND, lower extremity edema, claudication, dizziness, presyncope, syncope, bleeding, or neurologic sequela. The patient is tolerating medications without difficulties.  All she is feeling well.  She has no chest pain or shortness of breath.  She is able to do most of her daily activities though she does note that she is a little bit more weak and fatigued.  EKG today shows atrial flutter.   Past Medical History:  Diagnosis Date  . Acute cystitis without hematuria 12/22/2015  . Anemia   . Arthritis   . CAP (community acquired pneumonia) 02/08/2016  . Cataracts, bilateral    immature  . Chronic diastolic heart failure ([place]) 09/19/2014  . Chronic insomnia   . Constipation    takes Miralax daily as needed  . COPD (chronic obstructive pulmonary disease) (HCC)    Albuterol inhaler daily as needed;Duoneb daily as needed;Spiriva daily  . DDD (degenerative disc disease)    cervical - kyphosis with mod DD changes C5/6 and C6/7; lumbar - early DD at L2/3 ([place])  . Dyspnea    with exertion  . Essential hypertension    was on meds but after appointment Dec 11 with Dr.C he took her off  . GERD (gastroesophageal reflux disease)    takes Protonix daily  . History of pneumonia 02/16/2016  . History of radiation therapy 05/30/11 to 07/07/11   rectum  . History of shingles   . Hyperlipemia    takes Lovastatin daily  . Legally blind in left eye, as defined in [place]   . Lung nodule    RLL nodule-66mm stable 2006, April 2009, and June 2009  . Obesity   . Osteopenia 12/2014   T -1.5 hip  . Pancreatitis 11/2014   ?zpack related vs gallstone pancreatitis with abnormal HIDA scan pending cholecystectomy  . PONV (postoperative nausea and vomiting)   . Presence of permanent cardiac pacemaker   . Rectal cancer ([place]) 04/2011   T3N0; s/p lap LAR, s/p ileostomy, s/p reversal, s/p chemo  . Research study patient 08/26/2016  . Rheumatic heart disease mitral stenosis    mod MS by echo 02/2014  . S/P AVR (aortic valve replacement) 2015   bioprosthetic ([place])  . S/P MVR (mitral valve replacement) 2015   bioprosthetic ([place])  . Seizures ([place])    febrile seizures as a child  . Sepsis secondary to UTI ([place]) 08/22/2017  . Severe aortic valve stenosis  AVR 3/15 10/01/2013   mild-mod by echo 02/2014  . Sleep apnea    PT TOLD BORDERLINE-TRIED CPAP-DID NOT HELP-SHE DOES NOT USE CPAP  . Small bowel obstruction, partial ([place]) 08/2013   reolved without NGT placement.   . Vitamin B12 deficiency 02/28/2015   Start B12 shots 02/2015    Past Surgical History:  Procedure Laterality Date  . ANTERIOR LAT LUMBAR FUSION Left 07/17/2018  Procedure: Lumbar Two-Three Lumbar Three-Four Anterolateral decompression/interbody fusion with lateral plate fixation/Infuse;  Surgeon: Kristeen Miss, MD;  Location: [facility];  Service: Neurosurgery;  Laterality: Left;  Lumbar Two-Three Lumbar Three-Four Anterolateral decompression/interbody fusion with lateral plate fixation/Infuse  . AORTIC VALVE REPLACEMENT N/A 12/12/2013   Procedure: AORTIC VALVE REPLACEMENT (AVR);  Surgeon: Gaye Pollack, MD; Service: Open Heart Surgery  . BACK SURGERY  2006, 2007   2006 SPACER, 2007 decompression and fusion L4/5  . BOWEL RESECTION  04/02/2012   Procedure: SMALL BOWEL RESECTION;  Surgeon:  Stark Klein, MD;  Location: [facility];  Service: General;  Laterality: N/A;  . CARPAL TUNNEL RELEASE Bilateral   . CATARACT EXTRACTION W/ INTRAOCULAR LENS IMPLANT Left 10/18/2016  . CATARACT EXTRACTION W/ INTRAOCULAR LENS IMPLANT Right 12/13/2016   Dr. Kathrin Penner  . CHOLECYSTECTOMY N/A 01/21/2015   chronic cholecystitis, Stark Klein, MD  . COLON RESECTION  08/25/2011   Procedure: COLON RESECTION LAPAROSCOPIC;  Surgeon: Stark Klein, MD;  Location: [facility];  Service: General;  Laterality: N/A;  Laparoscopic Assisted Low Anterior Resection Diverting Ostomy and onQ pain pump  . COLONOSCOPY  03/2013   1 polyp, rpt 3 yrs Ardis Hughs)  . COLONOSCOPY WITH PROPOFOL N/A 06/09/2016   patent colo-colonic anastomosis, rpt 5 yrs Ardis Hughs)  . EP IMPLANTABLE DEVICE N/A 06/16/2016   Procedure: Pacemaker Implant;  Surgeon: Magdeline Prange Meredith Leeds, MD;  Location: [facility];  Service: Cardiovascular;  Laterality: N/A;  . FOOT SURGERY  left foot   hammer toe  . HAND SURGERY  Bil   carpal tunnel  . ILEOSTOMY  08/25/2011  . ILEOSTOMY CLOSURE  04/02/2012   Procedure: ILEOSTOMY TAKEDOWN;  Surgeon: Stark Klein, MD;  Location: [facility];  Service: General;  Laterality: N/A;  . INTRAOPERATIVE TRANSESOPHAGEAL ECHOCARDIOGRAM N/A 12/12/2013   Procedure: INTRAOPERATIVE TRANSESOPHAGEAL ECHOCARDIOGRAM;  Surgeon: Gaye Pollack, MD;  Location: [facility];  Service: Open Heart Surgery;  Laterality: N/A;  . LEFT AND RIGHT HEART CATHETERIZATION WITH CORONARY ANGIOGRAM N/A 11/27/2013   Procedure: LEFT AND RIGHT HEART CATHETERIZATION WITH CORONARY ANGIOGRAM;  Surgeon: Sanda Klein, MD;  Location: [facility];  Service: Cardiovascular;  Laterality: N/A;  . MITRAL VALVE REPLACEMENT N/A 12/12/2013   Procedure: MITRAL VALVE (MV) REPLACEMENT OR REPAIR;  Surgeon: Gaye Pollack, MD; Service: Open Heart Surgery  . TEE WITHOUT CARDIOVERSION N/A 11/29/2013   Procedure: TRANSESOPHAGEAL ECHOCARDIOGRAM (TEE);  Surgeon: Sueanne Margarita, MD;  Location: [facility]  ENDOSCOPY;  Service: Cardiovascular;  Laterality: N/A;  . TONSILLECTOMY       Current Outpatient Medications  Medication Sig Dispense Refill  . acetaminophen (TYLENOL) 325 MG tablet Take 650 mg by mouth every 6 (six) hours as needed for mild pain or headache.     . albuterol (PROVENTIL HFA;VENTOLIN HFA) 108 (90 Base) MCG/ACT inhaler Inhale 2 puffs into the lungs every 6 (six) hours as needed for wheezing. 1 Inhaler 2  . amoxicillin (AMOXIL) 500 MG capsule TAKE 4 CAPSULES BY MOUTH 30-60 MINUTES PRIOR TO APPOINTMENT 4 capsule 12  . Biotin 10 MG CAPS Take 10 mg by mouth daily.     Marland Kitchen BREO ELLIPTA 200-25 MCG/INH AEPB INHALE 1 PUFF INTO THE LUNGS ONCE DAILY 60 each 3  . Calcium Carbonate-Vitamin D (CALCIUM 600 + D PO) Take 1 tablet by mouth daily.     . Cholecalciferol (VITAMIN D) 2000 UNITS CAPS Take 2,000 Units by mouth daily.     . furosemide (LASIX) 40 MG tablet TAKE 1 TABLET BY MOUTH EVERY DAY  5 TIMES A WEEK 20 tablet 11  . HYDROcodone-acetaminophen (NORCO/VICODIN) 5-325 MG tablet Take 1 tablet by mouth every 4 (four) hours as needed for severe pain ((score 7 to 10)). 40 tablet 0  . ipratropium-albuterol (DUONEB) 0.5-2.5 (3) MG/3ML SOLN Take 3 mLs by nebulization every 6 (six) hours as needed. 360 mL 0  . lovastatin (MEVACOR) 40 MG tablet TAKE 1 TABLET BY MOUTH AT BEDTIME 30 tablet 4  . montelukast (SINGULAIR) 10 MG tablet TAKE 1 TABLET BY MOUTH AT BEDTIME 30 tablet 5  . Multiple Vitamins-Minerals (PRESERVISION AREDS 2 PO) Take 2 capsules by mouth daily.    . nitrofurantoin, macrocrystal-monohydrate, (MACROBID) 100 MG capsule Take 100 mg by mouth daily.    . polycarbophil (FIBERCON) 625 MG tablet Take 625 mg by mouth daily.    . polyethylene glycol (MIRALAX / GLYCOLAX) packet Take 17 g by mouth daily. 14 each 0  . potassium chloride (K-DUR) 10 MEQ tablet TAKE 2 TABLETS BY MOUTH ONCE DAILY 60 tablet 5  . Talc (ZEASORB EX) Apply 1 application topically daily.    Marland Kitchen tiotropium (SPIRIVA  HANDIHALER) 18 MCG inhalation capsule INHALE THE CONTENTS OF ONE CAPSULE IN THE HANDIHALER ONCE DAILY 30 capsule 5  . TOVIAZ 8 MG TB24 tablet Take 8 mg by mouth daily.   11  . traMADol (ULTRAM) 50 MG tablet Take 0.5-1 tablets (25-50 mg total) by mouth 2 (two) times daily as needed for moderate pain. 30 tablet 0  . vitamin B-12 (CYANOCOBALAMIN) 500 MCG tablet Take 500 mcg by mouth daily.    Alveda Reasons 20 MG TABS tablet TAKE 1 TABLET(20 MG) BY MOUTH DAILY WITH SUPPER 30 tablet 9   No current facility-administered medications for this visit.     Allergies:   Codeine; Prednisone; Sulfa antibiotics; and Sulfonamide derivatives   Social History:  The patient  reports that she quit smoking about 43 years ago. Her smoking use included cigarettes. She has a 15.00 pack-year smoking history. She has never used smokeless tobacco. She reports previous alcohol use of about 2.0 standard drinks of alcohol per week. She reports that she does not use drugs.   Family History:  The patient's family history includes Breast cancer (age of onset: 19) in her maternal aunt; CAD (age of onset: 93) in her son; Diabetes in her mother; Heart attack in her maternal grandfather; Heart attack (age of onset: 25) in her son; Heart disease in her mother; Hypertension in her mother; Lung cancer in her father; Stroke in her mother.    ROS:  Please see the history of present illness.   Otherwise, review of systems is positive for palpitations, back pian, easy bruising.   All other systems are reviewed and negative.   PHYSICAL EXAM: VS:  BP 114/66   Pulse 70   Ht 5\' 6"  (1.676 m)   Wt 144 lb (65.3 kg)   SpO2 97%   BMI 23.24 kg/m  , BMI Body mass index is 23.24 kg/m. GEN: Well nourished, well developed, in no acute distress  HEENT: normal  Neck: no JVD, carotid bruits, or masses Cardiac: RRR; no murmurs, rubs, or gallops,no edema  Respiratory:  clear to auscultation bilaterally, normal work of breathing GI: soft, nontender,  nondistended, + BS MS: no deformity or atrophy  Skin: warm and dry, device site well healed Neuro:  Strength and sensation are intact Psych: euthymic mood, full affect  EKG:  EKG is ordered today. Personal review of the ekg ordered shows atrial flutter, V  paced  Personal review of the device interrogation today. Results in [facility]: 02/05/2018: ALT 64 05/28/2018: TSH 1.81 07/18/2018: BUN 12; Creatinine, Ser 0.58; Potassium 3.6; Sodium 138 07/23/2018: Hemoglobin 13.1; Platelets 156    Lipid Panel     Component Value Date/Time   CHOL 147 11/15/2017 1001   TRIG 95.0 11/15/2017 1001   HDL 51.20 11/15/2017 1001   CHOLHDL 3 11/15/2017 1001   VLDL 19.0 11/15/2017 1001   LDLCALC 77 11/15/2017 1001   LDLDIRECT 111.0 06/26/2015 0928     Wt Readings from Last 3 Encounters:  11/23/18 144 lb (65.3 kg)  07/11/18 149 lb 8 oz (67.8 kg)  06/27/18 149 lb (67.6 kg)      Other studies Reviewed: Additional studies/ records that were reviewed today include: TTE 06/29/16  Review of the above records today demonstrates:  - Left ventricle: The cavity size was normal. Wall thickness was   increased in a pattern of mild LVH. Systolic function was normal.   The estimated ejection fraction was in the range of 60% to 65%.   Indeterminant diastolic function. Wall motion was normal; there   were no regional wall motion abnormalities. - Aortic valve: There was a bioprosthetic aortic valve. There did   not appear to be significant regurgitation or stenosis. Mean   gradient (S): 17 mm Hg. - Mitral valve: There was a bioprosthetic mitral valve. There did   not appear to be significant stenosis or regurgitation. The   residual native mitral apparatus is severely calcified. Mean   gradient (D): 6 mm Hg. Valve area by pressure half-time: 1.8   cm^2. - Left atrium: The atrium was mildly dilated. - Right ventricle: The cavity size was normal. Systolic function   was normal. - Tricuspid  valve: Peak RV-RA gradient (S): 23 mm Hg. - Pulmonary arteries: PA peak pressure: 26 mm Hg (S). - Inferior vena cava: The vessel was normal in size. The   respirophasic diameter changes were in the normal range (= 50%),   consistent with normal central venous pressure.  Myoview 02/26/18  Nuclear stress EF: 57%.  The study is normal.  This is a low risk study.  The left ventricular ejection fraction is normal (55-65%).   Normal pharmacologic nuclear stress test with no evidence for prior infarct or ischemia. Normal LVEF.   ASSESSMENT AND PLAN:  1.  2:1 AV block: Status post Saint Jude dual-chamber pacemaker.  Is functioning appropriately.  No changes.  2. Diastolic heart failure: Bisoprolol and Lasix.  No signs of volume overload  3. Rheumatic heart disease: Post mitral valve and aortic valve replacement.  Plan per primary cardiology.  4. Hyperlipidemia: Continue lovastatin per primary.  5. Paroxysmal atrial fibrillation: Currently on Xarelto.  Had syncope on high doses of bisoprolol with orthostasis.  Currently on metoprolol.  He is unfortunately in atrial flutter today.  I offered her ablation versus cardioversion.  She would like to apply cardioversion first to see if her symptoms improve.  This patients CHA2DS2-VASc Score and unadjusted Ischemic Stroke Rate (% per year) is equal to 2.2 % stroke rate/year from a score of 2  Above score calculated as 1 point each if present [CHF, HTN, DM, Vascular=MI/PAD/Aortic Plaque, Age if 65-74, or Female] Above score calculated as 2 points each if present [Age > 75, or Stroke/TIA/TE]  6.  Syncope: Likely due to high doses of bisoprolol.  No changes.   Current medicines are reviewed at length with the patient today.  The patient does not have concerns regarding her medicines.  The following changes were made today: none  Labs/ tests ordered today include:  Orders Placed This Encounter  Procedures  . EKG 12-Lead     Disposition:    FU with Jennise Both 3 months  Signed, Quamaine Webb Meredith Leeds, MD  11/23/2018 2:27 PM     [facility] [address] [place] [address] [phone] (office) [phone] (fax)

## 2018-11-23 NOTE — Progress Notes (Signed)
Electrophysiology Office Note   Date:  11/23/2018   ID:  [name], [name] 1946-05-17, MRN [id number]  PCP:  Diane Bush, MD  Cardiologist:  [name] Primary Electrophysiologist:  Diane Ebert Meredith Leeds, MD    No chief complaint on file.    History of Present Illness: [name] is a 73 y.o. female who presents today for electrophysiology evaluation.   She presented to the hospital in September 2017 with progressive exercise intolerance and shortness of breath and was found to be in 2:1 AV block. She has [employer] Jude dual-chamber pacemaker implanted at that time.   Today, denies symptoms of palpitations, chest pain, shortness of breath, orthopnea, PND, lower extremity edema, claudication, dizziness, presyncope, syncope, bleeding, or neurologic sequela. The patient is tolerating medications without difficulties.  All she is feeling well.  She has no chest pain or shortness of breath.  She is able to do most of her daily activities though she does note that she is a little bit more weak and fatigued.  EKG today shows atrial flutter.   Past Medical History:  Diagnosis Date  . Acute cystitis without hematuria 12/22/2015  . Anemia   . Arthritis   . CAP (community acquired pneumonia) 02/08/2016  . Cataracts, bilateral    immature  . Chronic diastolic heart failure ([place]) 09/19/2014  . Chronic insomnia   . Constipation    takes Miralax daily as needed  . COPD (chronic obstructive pulmonary disease) (HCC)    Albuterol inhaler daily as needed;Duoneb daily as needed;Spiriva daily  . DDD (degenerative disc disease)    cervical - kyphosis with mod DD changes C5/6 and C6/7; lumbar - early DD at L2/3 ([place])  . Dyspnea    with exertion  . Essential hypertension    was on meds but after appointment Dec 11 with Dr.C he took her off  . GERD (gastroesophageal reflux disease)    takes Protonix daily  . History of pneumonia 02/16/2016  . History of radiation therapy 05/30/11 to 07/07/11   rectum  . History of shingles   . Hyperlipemia    takes Lovastatin daily  . Legally blind in left eye, as defined in [place]   . Lung nodule    RLL nodule-44mm stable 2006, April 2009, and June 2009  . Obesity   . Osteopenia 12/2014   T -1.5 hip  . Pancreatitis 11/2014   ?zpack related vs gallstone pancreatitis with abnormal HIDA scan pending cholecystectomy  . PONV (postoperative nausea and vomiting)   . Presence of permanent cardiac pacemaker   . Rectal cancer ([place]) 04/2011   T3N0; s/p lap LAR, s/p ileostomy, s/p reversal, s/p chemo  . Research study patient 08/26/2016  . Rheumatic heart disease mitral stenosis    mod MS by echo 02/2014  . S/P AVR (aortic valve replacement) 2015   bioprosthetic ([place])  . S/P MVR (mitral valve replacement) 2015   bioprosthetic ([place])  . Seizures ([place])    febrile seizures as a child  . Sepsis secondary to UTI ([place]) 08/22/2017  . Severe aortic valve stenosis  AVR 3/15 10/01/2013   mild-mod by echo 02/2014  . Sleep apnea    PT TOLD BORDERLINE-TRIED CPAP-DID NOT HELP-SHE DOES NOT USE CPAP  . Small bowel obstruction, partial ([place]) 08/2013   reolved without NGT placement.   . Vitamin B12 deficiency 02/28/2015   Start B12 shots 02/2015    Past Surgical History:  Procedure Laterality Date  . ANTERIOR LAT LUMBAR FUSION Left 07/17/2018  Procedure: Lumbar Two-Three Lumbar Three-Four Anterolateral decompression/interbody fusion with lateral plate fixation/Infuse;  Surgeon: Kristeen Miss, MD;  Location: [facility];  Service: Neurosurgery;  Laterality: Left;  Lumbar Two-Three Lumbar Three-Four Anterolateral decompression/interbody fusion with lateral plate fixation/Infuse  . AORTIC VALVE REPLACEMENT N/A 12/12/2013   Procedure: AORTIC VALVE REPLACEMENT (AVR);  Surgeon: Gaye Pollack, MD; Service: Open Heart Surgery  . BACK SURGERY  2006, 2007   2006 SPACER, 2007 decompression and fusion L4/5  . BOWEL RESECTION  04/02/2012   Procedure: SMALL BOWEL RESECTION;  Surgeon:  Stark Klein, MD;  Location: [facility];  Service: General;  Laterality: N/A;  . CARPAL TUNNEL RELEASE Bilateral   . CATARACT EXTRACTION W/ INTRAOCULAR LENS IMPLANT Left 10/18/2016  . CATARACT EXTRACTION W/ INTRAOCULAR LENS IMPLANT Right 12/13/2016   Dr. Kathrin Penner  . CHOLECYSTECTOMY N/A 01/21/2015   chronic cholecystitis, Stark Klein, MD  . COLON RESECTION  08/25/2011   Procedure: COLON RESECTION LAPAROSCOPIC;  Surgeon: Stark Klein, MD;  Location: [facility];  Service: General;  Laterality: N/A;  Laparoscopic Assisted Low Anterior Resection Diverting Ostomy and onQ pain pump  . COLONOSCOPY  03/2013   1 polyp, rpt 3 yrs Ardis Hughs)  . COLONOSCOPY WITH PROPOFOL N/A 06/09/2016   patent colo-colonic anastomosis, rpt 5 yrs Ardis Hughs)  . EP IMPLANTABLE DEVICE N/A 06/16/2016   Procedure: Pacemaker Implant;  Surgeon: Abdulrahman Bracey Meredith Leeds, MD;  Location: [facility];  Service: Cardiovascular;  Laterality: N/A;  . FOOT SURGERY  left foot   hammer toe  . HAND SURGERY  Bil   carpal tunnel  . ILEOSTOMY  08/25/2011  . ILEOSTOMY CLOSURE  04/02/2012   Procedure: ILEOSTOMY TAKEDOWN;  Surgeon: Stark Klein, MD;  Location: [facility];  Service: General;  Laterality: N/A;  . INTRAOPERATIVE TRANSESOPHAGEAL ECHOCARDIOGRAM N/A 12/12/2013   Procedure: INTRAOPERATIVE TRANSESOPHAGEAL ECHOCARDIOGRAM;  Surgeon: Gaye Pollack, MD;  Location: [facility];  Service: Open Heart Surgery;  Laterality: N/A;  . LEFT AND RIGHT HEART CATHETERIZATION WITH CORONARY ANGIOGRAM N/A 11/27/2013   Procedure: LEFT AND RIGHT HEART CATHETERIZATION WITH CORONARY ANGIOGRAM;  Surgeon: Sanda Klein, MD;  Location: [facility];  Service: Cardiovascular;  Laterality: N/A;  . MITRAL VALVE REPLACEMENT N/A 12/12/2013   Procedure: MITRAL VALVE (MV) REPLACEMENT OR REPAIR;  Surgeon: Gaye Pollack, MD; Service: Open Heart Surgery  . TEE WITHOUT CARDIOVERSION N/A 11/29/2013   Procedure: TRANSESOPHAGEAL ECHOCARDIOGRAM (TEE);  Surgeon: Sueanne Margarita, MD;  Location: [facility]  ENDOSCOPY;  Service: Cardiovascular;  Laterality: N/A;  . TONSILLECTOMY       Current Outpatient Medications  Medication Sig Dispense Refill  . acetaminophen (TYLENOL) 325 MG tablet Take 650 mg by mouth every 6 (six) hours as needed for mild pain or headache.     . albuterol (PROVENTIL HFA;VENTOLIN HFA) 108 (90 Base) MCG/ACT inhaler Inhale 2 puffs into the lungs every 6 (six) hours as needed for wheezing. 1 Inhaler 2  . amoxicillin (AMOXIL) 500 MG capsule TAKE 4 CAPSULES BY MOUTH 30-60 MINUTES PRIOR TO APPOINTMENT 4 capsule 12  . Biotin 10 MG CAPS Take 10 mg by mouth daily.     Marland Kitchen BREO ELLIPTA 200-25 MCG/INH AEPB INHALE 1 PUFF INTO THE LUNGS ONCE DAILY 60 each 3  . Calcium Carbonate-Vitamin D (CALCIUM 600 + D PO) Take 1 tablet by mouth daily.     . Cholecalciferol (VITAMIN D) 2000 UNITS CAPS Take 2,000 Units by mouth daily.     . furosemide (LASIX) 40 MG tablet TAKE 1 TABLET BY MOUTH EVERY DAY  5 TIMES A WEEK 20 tablet 11  . HYDROcodone-acetaminophen (NORCO/VICODIN) 5-325 MG tablet Take 1 tablet by mouth every 4 (four) hours as needed for severe pain ((score 7 to 10)). 40 tablet 0  . ipratropium-albuterol (DUONEB) 0.5-2.5 (3) MG/3ML SOLN Take 3 mLs by nebulization every 6 (six) hours as needed. 360 mL 0  . lovastatin (MEVACOR) 40 MG tablet TAKE 1 TABLET BY MOUTH AT BEDTIME 30 tablet 4  . montelukast (SINGULAIR) 10 MG tablet TAKE 1 TABLET BY MOUTH AT BEDTIME 30 tablet 5  . Multiple Vitamins-Minerals (PRESERVISION AREDS 2 PO) Take 2 capsules by mouth daily.    . nitrofurantoin, macrocrystal-monohydrate, (MACROBID) 100 MG capsule Take 100 mg by mouth daily.    . polycarbophil (FIBERCON) 625 MG tablet Take 625 mg by mouth daily.    . polyethylene glycol (MIRALAX / GLYCOLAX) packet Take 17 g by mouth daily. 14 each 0  . potassium chloride (K-DUR) 10 MEQ tablet TAKE 2 TABLETS BY MOUTH ONCE DAILY 60 tablet 5  . Talc (ZEASORB EX) Apply 1 application topically daily.    Marland Kitchen tiotropium (SPIRIVA  HANDIHALER) 18 MCG inhalation capsule INHALE THE CONTENTS OF ONE CAPSULE IN THE HANDIHALER ONCE DAILY 30 capsule 5  . TOVIAZ 8 MG TB24 tablet Take 8 mg by mouth daily.   11  . traMADol (ULTRAM) 50 MG tablet Take 0.5-1 tablets (25-50 mg total) by mouth 2 (two) times daily as needed for moderate pain. 30 tablet 0  . vitamin B-12 (CYANOCOBALAMIN) 500 MCG tablet Take 500 mcg by mouth daily.    Alveda Reasons 20 MG TABS tablet TAKE 1 TABLET(20 MG) BY MOUTH DAILY WITH SUPPER 30 tablet 9   No current facility-administered medications for this visit.     Allergies:   Codeine; Prednisone; Sulfa antibiotics; and Sulfonamide derivatives   Social History:  The patient  reports that she quit smoking about 43 years ago. Her smoking use included cigarettes. She has a 15.00 pack-year smoking history. She has never used smokeless tobacco. She reports previous alcohol use of about 2.0 standard drinks of alcohol per week. She reports that she does not use drugs.   Family History:  The patient's family history includes Breast cancer (age of onset: 36) in her maternal aunt; CAD (age of onset: 53) in her son; Diabetes in her mother; Heart attack in her maternal grandfather; Heart attack (age of onset: 4) in her son; Heart disease in her mother; Hypertension in her mother; Lung cancer in her father; Stroke in her mother.    ROS:  Please see the history of present illness.   Otherwise, review of systems is positive for palpitations, back pian, easy bruising.   All other systems are reviewed and negative.   PHYSICAL EXAM: VS:  BP 114/66   Pulse 70   Ht 5\' 6"  (1.676 m)   Wt 144 lb (65.3 kg)   SpO2 97%   BMI 23.24 kg/m  , BMI Body mass index is 23.24 kg/m. GEN: Well nourished, well developed, in no acute distress  HEENT: normal  Neck: no JVD, carotid bruits, or masses Cardiac: RRR; no murmurs, rubs, or gallops,no edema  Respiratory:  clear to auscultation bilaterally, normal work of breathing GI: soft, nontender,  nondistended, + BS MS: no deformity or atrophy  Skin: warm and dry, device site well healed Neuro:  Strength and sensation are intact Psych: euthymic mood, full affect  EKG:  EKG is ordered today. Personal review of the ekg ordered shows atrial flutter, V  paced  Personal review of the device interrogation today. Results in [place]: 02/05/2018: ALT 64 05/28/2018: TSH 1.81 07/18/2018: BUN 12; Creatinine, Ser 0.58; Potassium 3.6; Sodium 138 07/23/2018: Hemoglobin 13.1; Platelets 156    Lipid Panel     Component Value Date/Time   CHOL 147 11/15/2017 1001   TRIG 95.0 11/15/2017 1001   HDL 51.20 11/15/2017 1001   CHOLHDL 3 11/15/2017 1001   VLDL 19.0 11/15/2017 1001   LDLCALC 77 11/15/2017 1001   LDLDIRECT 111.0 06/26/2015 0928     Wt Readings from Last 3 Encounters:  11/23/18 144 lb (65.3 kg)  07/11/18 149 lb 8 oz (67.8 kg)  06/27/18 149 lb (67.6 kg)      Other studies Reviewed: Additional studies/ records that were reviewed today include: TTE 06/29/16  Review of the above records today demonstrates:  - Left ventricle: The cavity size was normal. Wall thickness was   increased in a pattern of mild LVH. Systolic function was normal.   The estimated ejection fraction was in the range of 60% to 65%.   Indeterminant diastolic function. Wall motion was normal; there   were no regional wall motion abnormalities. - Aortic valve: There was a bioprosthetic aortic valve. There did   not appear to be significant regurgitation or stenosis. Mean   gradient (S): 17 mm Hg. - Mitral valve: There was a bioprosthetic mitral valve. There did   not appear to be significant stenosis or regurgitation. The   residual native mitral apparatus is severely calcified. Mean   gradient (D): 6 mm Hg. Valve area by pressure half-time: 1.8   cm^2. - Left atrium: The atrium was mildly dilated. - Right ventricle: The cavity size was normal. Systolic function   was normal. - Tricuspid  valve: Peak RV-RA gradient (S): 23 mm Hg. - Pulmonary arteries: PA peak pressure: 26 mm Hg (S). - Inferior vena cava: The vessel was normal in size. The   respirophasic diameter changes were in the normal range (= 50%),   consistent with normal central venous pressure.  Myoview 02/26/18  Nuclear stress EF: 57%.  The study is normal.  This is a low risk study.  The left ventricular ejection fraction is normal (55-65%).   Normal pharmacologic nuclear stress test with no evidence for prior infarct or ischemia. Normal LVEF.   ASSESSMENT AND PLAN:  1.  2:1 AV block: Status post Saint Jude dual-chamber pacemaker.  Is functioning appropriately.  No changes.  2. Diastolic heart failure: Bisoprolol and Lasix.  No signs of volume overload  3. Rheumatic heart disease: Post mitral valve and aortic valve replacement.  Plan per primary cardiology.  4. Hyperlipidemia: Continue lovastatin per primary.  5. Paroxysmal atrial fibrillation: Currently on Xarelto.  Had syncope on high doses of bisoprolol with orthostasis.  Currently on metoprolol.  He is unfortunately in atrial flutter today.  I offered her ablation versus cardioversion.  She would like to apply cardioversion first to see if her symptoms improve.  This patients CHA2DS2-VASc Score and unadjusted Ischemic Stroke Rate (% per year) is equal to 2.2 % stroke rate/year from a score of 2  Above score calculated as 1 point each if present [CHF, HTN, DM, Vascular=MI/PAD/Aortic Plaque, Age if 65-74, or Female] Above score calculated as 2 points each if present [Age > 75, or Stroke/TIA/TE]  6.  Syncope: Likely due to high doses of bisoprolol.  No changes.   Current medicines are reviewed at length with the patient today.  The patient does not have concerns regarding her medicines.  The following changes were made today: none  Labs/ tests ordered today include:  Orders Placed This Encounter  Procedures  . EKG 12-Lead     Disposition:    FU with Mekel Haverstock 3 months  Signed, Britanie Harshman Meredith Leeds, MD  11/23/2018 2:27 PM     [facility] [address] [place] [address] [phone] (office) [phone] (fax)

## 2018-11-23 NOTE — Addendum Note (Signed)
Addended by: Eulis Foster on: 11/23/2018 02:55 PM   Modules accepted: Orders

## 2018-11-24 LAB — BASIC METABOLIC PANEL
BUN/Creatinine Ratio: 26 (ref 12–28)
BUN: 23 mg/dL (ref 8–27)
CO2: 28 mmol/L (ref 20–29)
Calcium: 9.9 mg/dL (ref 8.7–10.3)
Chloride: 98 mmol/L (ref 96–106)
Creatinine, Ser: 0.88 mg/dL (ref 0.57–1.00)
GFR calc Af Amer: 75 mL/min/{1.73_m2} (ref >59)
GFR calc non Af Amer: 65 mL/min/{1.73_m2} (ref >59)
Glucose: 91 mg/dL (ref 65–99)
Potassium: 4 mmol/L (ref 3.5–5.2)
Sodium: 141 mmol/L (ref 134–144)

## 2018-11-24 LAB — CBC
Hematocrit: 41.5 % (ref 34.0–46.6)
Hemoglobin: 13.6 g/dL (ref 11.1–15.9)
MCH: 32.5 pg (ref 26.6–33.0)
MCHC: 32.8 g/dL (ref 31.5–35.7)
MCV: 99 fL — ABNORMAL HIGH (ref 79–97)
Platelets: 145 10*3/uL — ABNORMAL LOW (ref 150–450)
RBC: 4.18 x10E6/uL (ref 3.77–5.28)
RDW: 11.8 % (ref 11.7–15.4)
WBC: 6.7 10*3/uL (ref 3.4–10.8)

## 2018-11-26 ENCOUNTER — Encounter: Payer: Medicare Other | Admitting: Family Medicine

## 2018-11-27 LAB — CUP PACEART INCLINIC DEVICE CHECK
Battery Remaining Longevity: 130 mo
Battery Voltage: 3.01 V
Brady Statistic RA Percent Paced: 0.02 %
Brady Statistic RV Percent Paced: 99.68 %
Date Time Interrogation Session: 20200214190043
Implantable Lead Implant Date: 20170907
Implantable Lead Implant Date: 20170907
Implantable Lead Location: 753859
Implantable Lead Location: 753860
Implantable Pulse Generator Implant Date: 20170907
Lead Channel Impedance Value: 400 Ohm
Lead Channel Impedance Value: 550 Ohm
Lead Channel Pacing Threshold Amplitude: 0.5 V
Lead Channel Pacing Threshold Amplitude: 0.625 V
Lead Channel Pacing Threshold Pulse Width: 0.5 ms
Lead Channel Pacing Threshold Pulse Width: 0.5 ms
Lead Channel Sensing Intrinsic Amplitude: 12 mV
Lead Channel Sensing Intrinsic Amplitude: 3.5 mV
Lead Channel Setting Pacing Amplitude: 0.875
Lead Channel Setting Pacing Amplitude: 2 V
Lead Channel Setting Pacing Pulse Width: 0.5 ms
Lead Channel Setting Sensing Sensitivity: 2 mV
Pulse Gen Model: 2272
Pulse Gen Serial Number: 7945283

## 2018-12-02 ENCOUNTER — Encounter: Payer: Self-pay | Admitting: Family Medicine

## 2018-12-02 DIAGNOSIS — L659 Nonscarring hair loss, unspecified: Secondary | ICD-10-CM

## 2018-12-02 DIAGNOSIS — N183 Chronic kidney disease, stage 3 unspecified: Secondary | ICD-10-CM

## 2018-12-02 DIAGNOSIS — I10 Essential (primary) hypertension: Secondary | ICD-10-CM

## 2018-12-02 DIAGNOSIS — E559 Vitamin D deficiency, unspecified: Secondary | ICD-10-CM

## 2018-12-02 DIAGNOSIS — I484 Atypical atrial flutter: Secondary | ICD-10-CM

## 2018-12-02 DIAGNOSIS — E538 Deficiency of other specified B group vitamins: Secondary | ICD-10-CM

## 2018-12-02 DIAGNOSIS — E785 Hyperlipidemia, unspecified: Secondary | ICD-10-CM

## 2018-12-05 ENCOUNTER — Telehealth: Payer: Self-pay | Admitting: Cardiology

## 2018-12-05 NOTE — Telephone Encounter (Signed)
Forwarding to Dr. Sallyanne Kuster for review/advisement as he is performing DCCV. Dr. Sallyanne Kuster -- do I need to reschedule this procedure?

## 2018-12-05 NOTE — Telephone Encounter (Signed)
Pt confirms that she has only missed Monday's dose of Xarelto. Advised pt to continue w/ DCCV next week. Pt agreeable to plan.

## 2018-12-05 NOTE — Telephone Encounter (Signed)
° °  Pt has a Cardioversion on  Monday 12/10/18 at 9:30 am  Pt was told if she missed a dose of her medication prior to procedure to let the nurse know.   Pt missed a dose of  her  XARELTO 20 MG TABS tablet on Monday. She has continued to take the medication as normal since. She just wanted to make the nurses aware

## 2018-12-05 NOTE — Telephone Encounter (Signed)
One missed dose is OK as long as it was the only one this month MCr

## 2018-12-10 ENCOUNTER — Encounter (HOSPITAL_COMMUNITY)
Admission: RE | Disposition: A | Discharge status: Home / Self Care | Payer: Self-pay | Source: Ambulatory Visit | Attending: Cardiovascular Disease

## 2018-12-10 ENCOUNTER — Ambulatory Visit (HOSPITAL_COMMUNITY)
Admission: RE | Admit: 2018-12-10 | Discharge: 2018-12-10 | Disposition: A | Discharge status: Home / Self Care | Payer: Medicare Other | Source: Ambulatory Visit | Attending: Cardiovascular Disease | Admitting: Cardiovascular Disease

## 2018-12-10 ENCOUNTER — Ambulatory Visit (HOSPITAL_COMMUNITY): Payer: Medicare Other | Admitting: Anesthesiology

## 2018-12-10 ENCOUNTER — Other Ambulatory Visit: Payer: Self-pay

## 2018-12-10 ENCOUNTER — Encounter (HOSPITAL_COMMUNITY): Payer: Self-pay | Admitting: Certified Registered Nurse Anesthetist

## 2018-12-10 DIAGNOSIS — Z87891 Personal history of nicotine dependence: Secondary | ICD-10-CM | POA: Diagnosis not present

## 2018-12-10 DIAGNOSIS — Z79899 Other long term (current) drug therapy: Secondary | ICD-10-CM | POA: Insufficient documentation

## 2018-12-10 DIAGNOSIS — Z932 Ileostomy status: Secondary | ICD-10-CM | POA: Insufficient documentation

## 2018-12-10 DIAGNOSIS — M1990 Unspecified osteoarthritis, unspecified site: Secondary | ICD-10-CM | POA: Diagnosis not present

## 2018-12-10 DIAGNOSIS — E785 Hyperlipidemia, unspecified: Secondary | ICD-10-CM | POA: Insufficient documentation

## 2018-12-10 DIAGNOSIS — J449 Chronic obstructive pulmonary disease, unspecified: Secondary | ICD-10-CM | POA: Diagnosis not present

## 2018-12-10 DIAGNOSIS — Z952 Presence of prosthetic heart valve: Secondary | ICD-10-CM | POA: Diagnosis not present

## 2018-12-10 DIAGNOSIS — Z7901 Long term (current) use of anticoagulants: Secondary | ICD-10-CM | POA: Insufficient documentation

## 2018-12-10 DIAGNOSIS — Z6823 Body mass index (BMI) 23.0-23.9, adult: Secondary | ICD-10-CM | POA: Diagnosis not present

## 2018-12-10 DIAGNOSIS — Z539 Procedure and treatment not carried out, unspecified reason: Secondary | ICD-10-CM | POA: Insufficient documentation

## 2018-12-10 DIAGNOSIS — M8580 Other specified disorders of bone density and structure, unspecified site: Secondary | ICD-10-CM | POA: Diagnosis not present

## 2018-12-10 DIAGNOSIS — I4892 Unspecified atrial flutter: Secondary | ICD-10-CM | POA: Diagnosis not present

## 2018-12-10 DIAGNOSIS — I5032 Chronic diastolic (congestive) heart failure: Secondary | ICD-10-CM | POA: Insufficient documentation

## 2018-12-10 DIAGNOSIS — I110 Hypertensive heart disease with heart failure: Secondary | ICD-10-CM | POA: Insufficient documentation

## 2018-12-10 DIAGNOSIS — E669 Obesity, unspecified: Secondary | ICD-10-CM | POA: Diagnosis not present

## 2018-12-10 DIAGNOSIS — G4730 Sleep apnea, unspecified: Secondary | ICD-10-CM | POA: Diagnosis not present

## 2018-12-10 DIAGNOSIS — Z885 Allergy status to narcotic agent status: Secondary | ICD-10-CM | POA: Diagnosis not present

## 2018-12-10 DIAGNOSIS — Z85048 Personal history of other malignant neoplasm of rectum, rectosigmoid junction, and anus: Secondary | ICD-10-CM | POA: Diagnosis not present

## 2018-12-10 DIAGNOSIS — Z888 Allergy status to other drugs, medicaments and biological substances status: Secondary | ICD-10-CM | POA: Insufficient documentation

## 2018-12-10 DIAGNOSIS — Z882 Allergy status to sulfonamides status: Secondary | ICD-10-CM | POA: Insufficient documentation

## 2018-12-10 DIAGNOSIS — I480 Paroxysmal atrial fibrillation: Secondary | ICD-10-CM | POA: Diagnosis not present

## 2018-12-10 DIAGNOSIS — E119 Type 2 diabetes mellitus without complications: Secondary | ICD-10-CM | POA: Diagnosis not present

## 2018-12-10 DIAGNOSIS — I484 Atypical atrial flutter: Secondary | ICD-10-CM | POA: Diagnosis not present

## 2018-12-10 DIAGNOSIS — K219 Gastro-esophageal reflux disease without esophagitis: Secondary | ICD-10-CM | POA: Diagnosis not present

## 2018-12-10 DIAGNOSIS — Z950 Presence of cardiac pacemaker: Secondary | ICD-10-CM | POA: Insufficient documentation

## 2018-12-10 SURGERY — CANCELLED PROCEDURE
Anesthesia: General

## 2018-12-10 MED ORDER — PROMETHAZINE HCL 25 MG/ML IJ SOLN: 6.2500 mg | INTRAMUSCULAR | Status: DC | PRN

## 2018-12-10 NOTE — Progress Notes (Signed)
Patient arrived for cardioversion, she has pacemaker so the St jude rep interrogated her pacemaker and they were able to put in her back into NSR using their device. No cardioversion needed at this time. Patient discharged.

## 2018-12-10 NOTE — Interval H&P Note (Signed)
History and Physical Interval Note:  12/10/2018 9:39 AM  [name]  has presented today for surgery, with the diagnosis of fib/flutter  The various methods of treatment have been discussed with the patient and family. After consideration of risks, benefits and other options for treatment, the patient has consented to  Procedure(s): CARDIOVERSION (N/A) as a surgical intervention .  The patient's history has been reviewed, patient examined, no change in status, stable for surgery.  I have reviewed the patient's chart and labs.  Questions were answered to the patient's satisfaction.     Briton Sellman

## 2018-12-10 NOTE — Anesthesia Preprocedure Evaluation (Deleted)
Anesthesia Evaluation  Patient identified by MRN, date of birth, ID band Patient awake    Reviewed: Allergy & Precautions, NPO status , Patient's Chart, lab work & pertinent test results  History of Anesthesia Complications (+) PONV  Airway Mallampati: I  TM Distance: >3 FB Neck ROM: Full    Dental  (+) Teeth Intact, Dental Advisory Given   Pulmonary sleep apnea , COPD,  COPD inhaler, former smoker,    Pulmonary exam normal breath sounds clear to auscultation       Cardiovascular hypertension, Normal cardiovascular exam+ dysrhythmias Atrial Fibrillation + pacemaker  Rhythm:Regular Rate:Normal  02/2018 nuclear stress EF 57%, low risk study    Neuro/Psych negative neurological ROS     GI/Hepatic Neg liver ROS, GERD  ,  Endo/Other  negative endocrine ROS  Renal/GU Renal InsufficiencyRenal disease     Musculoskeletal  (+) Arthritis ,   Abdominal   Peds  Hematology negative hematology ROS (+)   Anesthesia Other Findings Day of surgery medications reviewed with the patient.  Reproductive/Obstetrics                            Anesthesia Physical Anesthesia Plan  ASA: III  Anesthesia Plan: General   Post-op Pain Management:    Induction:   PONV Risk Score and Plan: 4 or greater and Treatment may vary due to age or medical condition and Propofol infusion  Airway Management Planned: Mask  Additional Equipment:   Intra-op Plan:   Post-operative Plan:   Informed Consent: I have reviewed the patients History and Physical, chart, labs and discussed the procedure including the risks, benefits and alternatives for the proposed anesthesia with the patient or authorized representative who has indicated his/her understanding and acceptance.     Dental advisory given  Plan Discussed with: CRNA  Anesthesia Plan Comments: (CASE CANCELLED. Pt in sinus rhythm.)      Anesthesia Quick  Evaluation

## 2018-12-10 NOTE — Op Note (Addendum)
Comprehensive pacemaker check performed.  Prior to cardioversion, overdrive pacing via the device was attempted.  Flutter with CL 270 ms was seen at baseline. NIPS via the device was performed at gradually lower and lower atrial cycle lengths. After burst pacing at 150 ms the rhythm deteriorated to atrial fibrillation, but then promptly converted to sinus rhythm.  She has an appointment in 2 weeks. If there is substantial symptom improvement, will discus prevention of future events with ablation versus antiarrhythmics.  Sanda Klein, MD, [facility] [phone] office [phone] pager

## 2018-12-11 ENCOUNTER — Ambulatory Visit: Payer: Medicare Other | Admitting: Family Medicine

## 2018-12-23 ENCOUNTER — Other Ambulatory Visit: Payer: Self-pay | Admitting: Cardiovascular Disease

## 2018-12-23 ENCOUNTER — Other Ambulatory Visit: Payer: Self-pay | Admitting: Family Medicine

## 2018-12-25 ENCOUNTER — Telehealth: Payer: Self-pay | Admitting: Internal Medicine

## 2018-12-25 MED ORDER — MONTELUKAST SODIUM 10 MG PO TABS: 10.0000 mg | ORAL_TABLET | Freq: Every day | ORAL | 3 refills | Status: DC

## 2018-12-25 NOTE — Telephone Encounter (Signed)
Pt is returning call. Montelucasta 10MG  [place] [facility] is [phone].

## 2018-12-25 NOTE — Telephone Encounter (Signed)
lmtcb x1 pt 

## 2018-12-25 NOTE — Telephone Encounter (Signed)
Left message for patient to call back. Need to know what medication she needs a refill on.

## 2018-12-25 NOTE — Telephone Encounter (Signed)
Spoke with pt.  Script sent to preferred pharmacy.  Nothing further needed.

## 2018-12-26 ENCOUNTER — Other Ambulatory Visit (HOSPITAL_COMMUNITY): Payer: Medicare Other

## 2018-12-31 ENCOUNTER — Ambulatory Visit: Payer: Medicare Other

## 2019-01-01 NOTE — Telephone Encounter (Signed)
Labs ordered although appt cancelled due to [phone]

## 2019-01-02 ENCOUNTER — Other Ambulatory Visit: Payer: Self-pay | Admitting: Family Medicine

## 2019-01-02 ENCOUNTER — Encounter: Payer: Medicare Other | Admitting: Cardiovascular Disease

## 2019-01-02 DIAGNOSIS — Z1231 Encounter for screening mammogram for malignant neoplasm of breast: Secondary | ICD-10-CM

## 2019-01-07 ENCOUNTER — Encounter: Payer: Medicare Other | Admitting: Family Medicine

## 2019-01-28 ENCOUNTER — Other Ambulatory Visit: Payer: Self-pay | Admitting: Cardiovascular Disease

## 2019-01-28 NOTE — Telephone Encounter (Signed)
Furosemide  40 mg refilled 

## 2019-01-31 ENCOUNTER — Ambulatory Visit (INDEPENDENT_AMBULATORY_CARE_PROVIDER_SITE_OTHER): Payer: Medicare Other | Admitting: *Deleted

## 2019-01-31 ENCOUNTER — Other Ambulatory Visit: Payer: Self-pay

## 2019-01-31 DIAGNOSIS — I480 Paroxysmal atrial fibrillation: Secondary | ICD-10-CM

## 2019-01-31 DIAGNOSIS — I441 Atrioventricular block, second degree: Secondary | ICD-10-CM

## 2019-01-31 LAB — CUP PACEART REMOTE DEVICE CHECK
Battery Remaining Longevity: 109 mo
Battery Remaining Percentage: 95.5 %
Battery Voltage: 2.99 V
Brady Statistic AP VP Percent: 67 %
Brady Statistic AP VS Percent: 1 %
Brady Statistic AS VP Percent: 32 %
Brady Statistic AS VS Percent: 1 %
Brady Statistic RA Percent Paced: 67 %
Brady Statistic RV Percent Paced: 99 %
Date Time Interrogation Session: 20200423112935
Implantable Lead Implant Date: 20170907
Implantable Lead Implant Date: 20170907
Implantable Lead Location: 753859
Implantable Lead Location: 753860
Implantable Pulse Generator Implant Date: 20170907
Lead Channel Impedance Value: 410 Ohm
Lead Channel Impedance Value: 540 Ohm
Lead Channel Pacing Threshold Amplitude: 0.625 V
Lead Channel Pacing Threshold Pulse Width: 0.5 ms
Lead Channel Sensing Intrinsic Amplitude: 4.1 mV
Lead Channel Setting Pacing Amplitude: 0.875
Lead Channel Setting Pacing Amplitude: 2 V
Lead Channel Setting Pacing Pulse Width: 0.5 ms
Lead Channel Setting Sensing Sensitivity: 2 mV
Pulse Gen Model: 2272
Pulse Gen Serial Number: 7945283

## 2019-02-08 NOTE — Progress Notes (Signed)
Remote pacemaker transmission.   

## 2019-02-21 ENCOUNTER — Other Ambulatory Visit: Payer: Self-pay

## 2019-02-21 ENCOUNTER — Telehealth: Payer: Self-pay | Admitting: Cardiovascular Disease

## 2019-02-21 DIAGNOSIS — Z952 Presence of prosthetic heart valve: Secondary | ICD-10-CM

## 2019-02-21 NOTE — Telephone Encounter (Signed)
Called patient to discuss reasons for wanting to move the echocardiogram appointment to later in the summer. She stated the reason for wanting to delay the echocardiogram appointment is because she is nervous about catching the Covid - 19 virus and she is trying to do the right thing and stay inside.   Patient also mentioned that she sent a transmission to her pacemaker doctor in April and she had not heard anything and all they did was to schedule an appointment. She asked if Dr. Sallyanne Kuster may want to look at the transmission she sent in April?  Please advise about moving echocardiogram appointment to later in summer and about the transmission sent in April.

## 2019-02-21 NOTE — Telephone Encounter (Signed)
New Message   Patient is calling to see whether or not she would need to keep her echocardiogram appt. Can she delay it for later in the summer? Please call.

## 2019-02-21 NOTE — Telephone Encounter (Signed)
I think it is perfectly fine for her to delay her echocardiogram for about 3 months. We did receive the download from April 27 and it was reviewed by Dr. Curt Bears.  It was a normal transmission.  Sorry she did not get feedback. Please give her my best, MCr

## 2019-02-21 NOTE — Telephone Encounter (Signed)
Called patient and let her know that it was fine to delay her echocardiogram for 3 months and that the last transmission she sent was reviewed by Dr. Curt Bears and it was fine. Will route this call to [facility]  to reschedule the echo. Will also send a message to reschedule the appointment to review the echo results.

## 2019-02-26 ENCOUNTER — Telehealth: Payer: Self-pay | Admitting: *Deleted

## 2019-02-26 NOTE — Telephone Encounter (Signed)
FYI Patient called stating that she is hearing all over the news that since she is in the high risk category she should be tested for Covid 19. Patient stated that she is not having any symptoms and has not had any exposure to anyone with Covid 19. Patient stated that she has been staying at home most of the time and wears a mask if she goes out. Advised patient that if she wants to be tested we can see about setting up a virtual visit with her PCP and he can determine if she needs to be tested and where she should go to have the test done. Patient stated that she does not feel that she needs testing now, but will call back if she changes her mind.

## 2019-02-27 NOTE — Telephone Encounter (Signed)
Noted. I think she would be eligible for testing at the [facility] site.

## 2019-02-28 ENCOUNTER — Encounter: Payer: Medicare Other | Admitting: Cardiology

## 2019-03-11 ENCOUNTER — Other Ambulatory Visit (HOSPITAL_COMMUNITY): Payer: Medicare Other

## 2019-03-20 ENCOUNTER — Encounter: Payer: Medicare Other | Admitting: Cardiovascular Disease

## 2019-04-04 ENCOUNTER — Other Ambulatory Visit (HOSPITAL_COMMUNITY): Payer: Self-pay | Admitting: Neurological Surgery

## 2019-04-04 ENCOUNTER — Other Ambulatory Visit: Payer: Self-pay | Admitting: Neurological Surgery

## 2019-04-04 DIAGNOSIS — M21372 Foot drop, left foot: Secondary | ICD-10-CM

## 2019-04-04 DIAGNOSIS — M21371 Foot drop, right foot: Secondary | ICD-10-CM

## 2019-04-05 ENCOUNTER — Other Ambulatory Visit: Payer: Self-pay

## 2019-04-05 ENCOUNTER — Ambulatory Visit
Admission: RE | Admit: 2019-04-05 | Discharge: 2019-04-05 | Disposition: A | Discharge status: Home / Self Care | Payer: Medicare Other | Source: Ambulatory Visit | Attending: Family Medicine | Admitting: Family Medicine

## 2019-04-05 DIAGNOSIS — Z1231 Encounter for screening mammogram for malignant neoplasm of breast: Secondary | ICD-10-CM

## 2019-04-05 LAB — HM MAMMOGRAPHY

## 2019-04-08 ENCOUNTER — Encounter: Payer: Self-pay | Admitting: Family Medicine

## 2019-04-15 ENCOUNTER — Telehealth: Payer: Self-pay | Admitting: Cardiology

## 2019-04-15 NOTE — Telephone Encounter (Signed)

## 2019-04-16 ENCOUNTER — Encounter: Payer: Self-pay | Admitting: Cardiology

## 2019-04-16 ENCOUNTER — Other Ambulatory Visit: Payer: Self-pay

## 2019-04-16 ENCOUNTER — Ambulatory Visit (INDEPENDENT_AMBULATORY_CARE_PROVIDER_SITE_OTHER): Payer: Medicare Other | Admitting: Cardiology

## 2019-04-16 VITALS — BP 124/76 | HR 71 | Ht 72.0 in | Wt 138.0 lb

## 2019-04-16 DIAGNOSIS — I480 Paroxysmal atrial fibrillation: Secondary | ICD-10-CM | POA: Diagnosis not present

## 2019-04-16 MED ORDER — SODIUM CHLORIDE 0.9 % IV SOLN: 4.0000 mg | Freq: Four times a day (QID) | INTRAVENOUS | Status: DC | PRN

## 2019-04-16 NOTE — Progress Notes (Signed)
Electrophysiology Office Note   Date:  04/16/2019   ID:  [name], [place] 1946/08/26, MRN [id number]  PCP:  Ria Bush, MD  Cardiologist:  [place] Primary Electrophysiologist:   Meredith Leeds, MD    No chief complaint on file.    History of Present Illness: [name] is a 73 y.o. female who presents today for electrophysiology evaluation.   She presented to the hospital in September 2017 with progressive exercise intolerance and shortness of breath and was found to be in 2:1 AV block. She has St. Jude dual-chamber pacemaker implanted at that time.   Today, denies symptoms of palpitations, chest pain, shortness of breath, orthopnea, PND, lower extremity edema, claudication, dizziness, presyncope, syncope, bleeding, or neurologic sequela. The patient is tolerating medications without difficulties.  Overall she is doing well.  Fortunately she remains in sinus rhythm.  She has not had any further issues with rhythm abnormalities.  Last night she did have an episode that woke her from sleep of 3 to 4 minutes of chest and the back pain.  Device interrogation shows no major abnormalities.  I have told her to call us if she has any further episodes.   Past Medical History:  Diagnosis Date  . Acute cystitis without hematuria 12/22/2015  . Anemia   . Arthritis   . CAP (community acquired pneumonia) 02/08/2016  . Cataracts, bilateral    immature  . Chronic diastolic heart failure ([place]) 09/19/2014  . Chronic insomnia   . Constipation    takes Miralax daily as needed  . COPD (chronic obstructive pulmonary disease) (HCC)    Albuterol inhaler daily as needed;Duoneb daily as needed;Spiriva daily  . DDD (degenerative disc disease)    cervical - kyphosis with mod DD changes C5/6 and C6/7; lumbar - early DD at L2/3 ([place])  . Dyspnea    with exertion  . Essential hypertension    was on meds but after appointment Dec 11 with Dr.C he took her off  . GERD  (gastroesophageal reflux disease)    takes Protonix daily  . History of pneumonia 02/16/2016  . History of radiation therapy 05/30/11 to 07/07/11   rectum  . History of shingles   . Hyperlipemia    takes Lovastatin daily  . Legally blind in left eye, as defined in [place]   . Lung nodule    RLL nodule-8mm stable 2006, April 2009, and June 2009  . Obesity   . Osteopenia 12/2014   T -1.5 hip  . Pancreatitis 11/2014   ?zpack related vs gallstone pancreatitis with abnormal HIDA scan pending cholecystectomy  . PONV (postoperative nausea and vomiting)   . Presence of permanent cardiac pacemaker   . Rectal cancer ([place]) 04/2011   T3N0; s/p lap LAR, s/p ileostomy, s/p reversal, s/p chemo  . Research study patient 08/26/2016  . Rheumatic heart disease mitral stenosis    mod MS by echo 02/2014  . S/P AVR (aortic valve replacement) 2015   bioprosthetic ([place])  . S/P MVR (mitral valve replacement) 2015   bioprosthetic ([place])  . Seizures ([place])    febrile seizures as a child  . Sepsis secondary to UTI ([place]) 08/22/2017  . Severe aortic valve stenosis  AVR 3/15 10/01/2013   mild-mod by echo 02/2014  . Sleep apnea    PT TOLD BORDERLINE-TRIED CPAP-DID NOT HELP-SHE DOES NOT USE CPAP  . Small bowel obstruction, partial ([place]) 08/2013   reolved without NGT placement.   . Vitamin B12 deficiency 02/28/2015  Start B12 shots 02/2015    Past Surgical History:  Procedure Laterality Date  . ANTERIOR LAT LUMBAR FUSION Left 07/17/2018   Procedure: Lumbar Two-Three Lumbar Three-Four Anterolateral decompression/interbody fusion with lateral plate fixation/Infuse;  Surgeon: Kristeen Miss, MD;  Location: [facility];  Service: Neurosurgery;  Laterality: Left;  Lumbar Two-Three Lumbar Three-Four Anterolateral decompression/interbody fusion with lateral plate fixation/Infuse  . AORTIC VALVE REPLACEMENT N/A 12/12/2013   Procedure: AORTIC VALVE REPLACEMENT (AVR);  Surgeon: Gaye Pollack, MD; Service: Open Heart Surgery  . BACK  SURGERY  2006, 2007   2006 SPACER, 2007 decompression and fusion L4/5  . BOWEL RESECTION  04/02/2012   Procedure: SMALL BOWEL RESECTION;  Surgeon: Stark Klein, MD;  Location: [facility];  Service: General;  Laterality: N/A;  . CARPAL TUNNEL RELEASE Bilateral   . CATARACT EXTRACTION W/ INTRAOCULAR LENS IMPLANT Left 10/18/2016  . CATARACT EXTRACTION W/ INTRAOCULAR LENS IMPLANT Right 12/13/2016   Dr. Kathrin Penner  . CHOLECYSTECTOMY N/A 01/21/2015   chronic cholecystitis, Stark Klein, MD  . COLON RESECTION  08/25/2011   Procedure: COLON RESECTION LAPAROSCOPIC;  Surgeon: Stark Klein, MD;  Location: [facility];  Service: General;  Laterality: N/A;  Laparoscopic Assisted Low Anterior Resection Diverting Ostomy and onQ pain pump  . COLONOSCOPY  03/2013   1 polyp, rpt 3 yrs Ardis Hughs)  . COLONOSCOPY WITH PROPOFOL N/A 06/09/2016   patent colo-colonic anastomosis, rpt 5 yrs Ardis Hughs)  . EP IMPLANTABLE DEVICE N/A 06/16/2016   Procedure: Pacemaker Implant;  Surgeon:  Meredith Leeds, MD;  Location: [facility];  Service: Cardiovascular;  Laterality: N/A;  . FOOT SURGERY  left foot   hammer toe  . HAND SURGERY  Bil   carpal tunnel  . ILEOSTOMY  08/25/2011  . ILEOSTOMY CLOSURE  04/02/2012   Procedure: ILEOSTOMY TAKEDOWN;  Surgeon: Stark Klein, MD;  Location: [facility];  Service: General;  Laterality: N/A;  . INTRAOPERATIVE TRANSESOPHAGEAL ECHOCARDIOGRAM N/A 12/12/2013   Procedure: INTRAOPERATIVE TRANSESOPHAGEAL ECHOCARDIOGRAM;  Surgeon: Gaye Pollack, MD;  Location: [facility];  Service: Open Heart Surgery;  Laterality: N/A;  . LEFT AND RIGHT HEART CATHETERIZATION WITH CORONARY ANGIOGRAM N/A 11/27/2013   Procedure: LEFT AND RIGHT HEART CATHETERIZATION WITH CORONARY ANGIOGRAM;  Surgeon: Sanda Klein, MD;  Location: [facility];  Service: Cardiovascular;  Laterality: N/A;  . MITRAL VALVE REPLACEMENT N/A 12/12/2013   Procedure: MITRAL VALVE (MV) REPLACEMENT OR REPAIR;  Surgeon: Gaye Pollack, MD; Service: Open Heart  Surgery  . TEE WITHOUT CARDIOVERSION N/A 11/29/2013   Procedure: TRANSESOPHAGEAL ECHOCARDIOGRAM (TEE);  Surgeon: Sueanne Margarita, MD;  Location: [facility];  Service: Cardiovascular;  Laterality: N/A;  . TONSILLECTOMY       Current Outpatient Medications  Medication Sig Dispense Refill  . acetaminophen (TYLENOL) 325 MG tablet Take 650 mg by mouth every 6 (six) hours as needed for mild pain or headache.     . albuterol (PROVENTIL HFA;VENTOLIN HFA) 108 (90 Base) MCG/ACT inhaler Inhale 2 puffs into the lungs every 6 (six) hours as needed for wheezing. 1 Inhaler 2  . amoxicillin (AMOXIL) 500 MG capsule TAKE 4 CAPSULES BY MOUTH 30-60 MINUTES PRIOR TO APPOINTMENT 4 capsule 12  . Biotin 1000 MCG tablet Take 1,000 mcg by mouth daily.    Marland Kitchen BREO ELLIPTA 200-25 MCG/INH AEPB INHALE 1 PUFF INTO THE LUNGS ONCE DAILY 60 each 3  . Calcium Carb-Cholecalciferol (CALCIUM 600+D) 600-800 MG-UNIT TABS Take 1 tablet by mouth every evening.    . Cholecalciferol (VITAMIN D) 2000 UNITS CAPS Take  2,000 Units by mouth every evening.     . docusate sodium (COLACE) 100 MG capsule Take 200 mg by mouth every evening.    . furosemide (LASIX) 40 MG tablet TAKE 1 TABLET BY MOUTH EVERY DAY 5 TIMES A WEEK 20 tablet 11  . HYDROcodone-acetaminophen (NORCO/VICODIN) 5-325 MG tablet Take 1 tablet by mouth every 4 (four) hours as needed for severe pain ((score 7 to 10)). 40 tablet 0  . ipratropium-albuterol (DUONEB) 0.5-2.5 (3) MG/3ML SOLN Take 3 mLs by nebulization every 6 (six) hours as needed. 360 mL 0  . lovastatin (MEVACOR) 40 MG tablet Take 1 tablet (40 mg total) by mouth at bedtime. 90 tablet 0  . montelukast (SINGULAIR) 10 MG tablet TAKE 1 TABLET BY MOUTH AT BEDTIME 30 tablet 5  . montelukast (SINGULAIR) 10 MG tablet Take 1 tablet (10 mg total) by mouth at bedtime. 30 tablet 3  . Multiple Vitamins-Minerals (PRESERVISION AREDS 2 PO) Take 2 capsules by mouth every evening.     . nitrofurantoin, macrocrystal-monohydrate,  (MACROBID) 100 MG capsule Take 100 mg by mouth every evening.     . polycarbophil (FIBERCON) 625 MG tablet Take 625 mg by mouth every evening.     . polyethylene glycol (MIRALAX / GLYCOLAX) packet Take 17 g by mouth daily. 14 each 0  . potassium chloride (K-DUR) 10 MEQ tablet TAKE 2 TABLETS BY MOUTH ONCE DAILY 60 tablet 4  . Talc (ZEASORB EX) Apply 1 application topically daily. Under skin folds (abdomen)    . tiotropium (SPIRIVA HANDIHALER) 18 MCG inhalation capsule INHALE THE CONTENTS OF ONE CAPSULE IN THE HANDIHALER ONCE DAILY 30 capsule 5  . TOVIAZ 8 MG TB24 tablet Take 8 mg by mouth every evening.   11  . traMADol (ULTRAM) 50 MG tablet Take 0.5-1 tablets (25-50 mg total) by mouth 2 (two) times daily as needed for moderate pain. 30 tablet 0  . vitamin B-12 (CYANOCOBALAMIN) 500 MCG tablet Take 500 mcg by mouth every evening.     Alveda Reasons 20 MG TABS tablet TAKE 1 TABLET(20 MG) BY MOUTH DAILY WITH SUPPER 30 tablet 9   No current facility-administered medications for this visit.     Allergies:   Codeine, Prednisone, Sulfa antibiotics, and Sulfonamide derivatives   Social History:  The patient  reports that she quit smoking about 44 years ago. Her smoking use included cigarettes. She has a 15.00 pack-year smoking history. She has never used smokeless tobacco. She reports previous alcohol use of about 2.0 standard drinks of alcohol per week. She reports that she does not use drugs.   Family History:  The patient's family history includes Breast cancer (age of onset: 51) in her maternal aunt; CAD (age of onset: 22) in her son; Diabetes in her mother; Heart attack in her maternal grandfather; Heart attack (age of onset: 44) in her son; Heart disease in her mother; Hypertension in her mother; Lung cancer in her father; Stroke in her mother.    ROS:  Please see the history of present illness.   Otherwise, review of systems is positive for none.   All other systems are reviewed and negative.    PHYSICAL EXAM: VS:  BP 124/76   Pulse 71   Ht 6' (1.829 m)   Wt 138 lb (62.6 kg)   BMI 18.72 kg/m  , BMI Body mass index is 18.72 kg/m. GEN: Well nourished, well developed, in no acute distress  HEENT: normal  Neck: no JVD, carotid bruits, or masses Cardiac:  RRR; no murmurs, rubs, or gallops,no edema  Respiratory:  clear to auscultation bilaterally, normal work of breathing GI: soft, nontender, nondistended, + BS MS: no deformity or atrophy  Skin: warm and dry, device site well healed Neuro:  Strength and sensation are intact Psych: euthymic mood, full affect  EKG:  EKG is ordered today. Personal review of the ekg ordered shows sinus rhythm, ventricular paced  Personal review of the device interrogation today. Results in [place]: 05/28/2018: TSH 1.81 11/23/2018: BUN 23; Creatinine, Ser 0.88; Hemoglobin 13.6; Platelets 145; Potassium 4.0; Sodium 141    Lipid Panel     Component Value Date/Time   CHOL 147 11/15/2017 1001   TRIG 95.0 11/15/2017 1001   HDL 51.20 11/15/2017 1001   CHOLHDL 3 11/15/2017 1001   VLDL 19.0 11/15/2017 1001   LDLCALC 77 11/15/2017 1001   LDLDIRECT 111.0 06/26/2015 0928     Wt Readings from Last 3 Encounters:  04/16/19 138 lb (62.6 kg)  12/10/18 144 lb (65.3 kg)  11/23/18 144 lb (65.3 kg)      Other studies Reviewed: Additional studies/ records that were reviewed today include: TTE 06/29/16  Review of the above records today demonstrates:  - Left ventricle: The cavity size was normal. Wall thickness was   increased in a pattern of mild LVH. Systolic function was normal.   The estimated ejection fraction was in the range of 60% to 65%.   Indeterminant diastolic function. Wall motion was normal; there   were no regional wall motion abnormalities. - Aortic valve: There was a bioprosthetic aortic valve. There did   not appear to be significant regurgitation or stenosis. Mean   gradient (S): 17 mm Hg. - Mitral valve: There was a  bioprosthetic mitral valve. There did   not appear to be significant stenosis or regurgitation. The   residual native mitral apparatus is severely calcified. Mean   gradient (D): 6 mm Hg. Valve area by pressure half-time: 1.8   cm^2. - Left atrium: The atrium was mildly dilated. - Right ventricle: The cavity size was normal. Systolic function   was normal. - Tricuspid valve: Peak RV-RA gradient (S): 23 mm Hg. - Pulmonary arteries: PA peak pressure: 26 mm Hg (S). - Inferior vena cava: The vessel was normal in size. The   respirophasic diameter changes were in the normal range (= 50%),   consistent with normal central venous pressure.  Myoview 02/26/18  Nuclear stress EF: 57%.  The study is normal.  This is a low risk study.  The left ventricular ejection fraction is normal (55-65%).   Normal pharmacologic nuclear stress test with no evidence for prior infarct or ischemia. Normal LVEF.   ASSESSMENT AND PLAN:  1.  2:1 AV block: Saint Jude dual-chamber pacemaker.  Device functioning appropriately.  No changes.  2. Diastolic heart failure: Bisoprolol and Lasix.  No signs of volume overload.  3. Rheumatic heart disease: Post mitral and aortic valve replacement.  Plan per primary cardiology.  4. Hyperlipidemia: Continue lovastatin.  5.  Persistent atrial fibrillation/flutter: Currently on Xarelto.  Did have syncope with high doses of bisoprolol and orthostasis.  She is currently on metoprolol.  She has been paced out of her atrial flutter.  Has been in sinus rhythm since that time.  No changes.  This patients CHA2DS2-VASc Score and unadjusted Ischemic Stroke Rate (% per year) is equal to 2.2 % stroke rate/year from a score of 2  Above score calculated as 1 point each  if present [CHF, HTN, DM, Vascular=MI/PAD/Aortic Plaque, Age if 27-74, or Female] Above score calculated as 2 points each if present [Age > 75, or Stroke/TIA/TE]   6.  Syncope: None further.  Likely due to high doses  of bisoprolol.     Current medicines are reviewed at length with the patient today.   The patient does not have concerns regarding her medicines.  The following changes were made today: None  Labs/ tests ordered today include:  Orders Placed This Encounter  Procedures  . EKG 12-Lead     Disposition:   FU with   12 months  Signed,  Meredith Leeds, MD  04/16/2019 2:50 PM     [facility] [address] [place] [address] [phone] (office) [phone] (fax)

## 2019-04-17 LAB — CUP PACEART INCLINIC DEVICE CHECK
Battery Remaining Longevity: 106 mo
Battery Voltage: 2.99 V
Brady Statistic RA Percent Paced: 72 %
Brady Statistic RV Percent Paced: 99.87 %
Date Time Interrogation Session: 20200707182313
Implantable Lead Implant Date: 20170907
Implantable Lead Implant Date: 20170907
Implantable Lead Location: 753859
Implantable Lead Location: 753860
Implantable Pulse Generator Implant Date: 20170907
Lead Channel Impedance Value: 412.5 Ohm
Lead Channel Impedance Value: 550 Ohm
Lead Channel Pacing Threshold Amplitude: 0.5 V
Lead Channel Pacing Threshold Amplitude: 0.5 V
Lead Channel Pacing Threshold Amplitude: 0.625 V
Lead Channel Pacing Threshold Pulse Width: 0.5 ms
Lead Channel Pacing Threshold Pulse Width: 0.5 ms
Lead Channel Pacing Threshold Pulse Width: 0.5 ms
Lead Channel Sensing Intrinsic Amplitude: 3.3 mV
Lead Channel Sensing Intrinsic Amplitude: 8.1 mV
Lead Channel Setting Pacing Amplitude: 0.875
Lead Channel Setting Pacing Amplitude: 2 V
Lead Channel Setting Pacing Pulse Width: 0.5 ms
Lead Channel Setting Sensing Sensitivity: 2 mV
Pulse Gen Model: 2272
Pulse Gen Serial Number: 7945283

## 2019-04-19 ENCOUNTER — Telehealth: Payer: Self-pay | Admitting: Internal Medicine

## 2019-04-19 NOTE — Telephone Encounter (Signed)
Script Screening patients for COVID-19 and reviewing new operational procedures  Greeting - The reason I am calling is to share with you some new changes to our processes that are designed to help [place] keep everyone safe. Is now a good time to speak with you? Patient says "no' - ask them when you can call back and let them know it's important to do this prior to their appointment.  Patient says "yes" - Great, [place] the first thing I need to do is ask you some screening Questions.  1. To the best of your knowledge, have you been in close contact with any one with a confirmed diagnosis of COVID 19? o No - proceed to next question  2. Have you had any one or more of the following: fever, chills, cough, shortness of breath or any flu-like symptoms? o No - proceed to next question  3. Have you been diagnosed with or have a previous diagnosis of COVID 19? o No - proceed to next question  4. I am going to go over a few other symptoms with you. Please let me know if you are experiencing any of the following: . Ear, nose or throat discomfort . A sore throat . Headache . Muscle pain . Diarrhea . Loss of taste or smell o No - proceed to next question  Thank you for answering these questions. Please know we will ask you these questions or similar questions when you arrive for your appointment and again it's how we are keeping everyone safe. Also, to keep you safe, please use the provided hand sanitizer when you enter the building. (Insert pt name), we are asking everyone in the building to wear a mask because they help [place] prevent the spread of germs. Do you have a mask of your own, if not, we are happy to provide one for you. The last thing I want to go over with you is the no visitor guidelines. This means no one can attend the appointment with you unless you need physical assistance. I understand this may be different from your past appointments and I know this may be difficult but please  know if someone is driving you we are happy to call them for you once your appointment is over.  [INSERT [place]  (Insert pt name) I've given you a lot of information, what questions do you have about what I've talked about today or your appointment tomorrow?  Benjie Karvonen, CMA

## 2019-04-22 ENCOUNTER — Other Ambulatory Visit: Payer: Self-pay

## 2019-04-22 ENCOUNTER — Ambulatory Visit (INDEPENDENT_AMBULATORY_CARE_PROVIDER_SITE_OTHER)
Admission: RE | Admit: 2019-04-22 | Discharge: 2019-04-22 | Disposition: A | Discharge status: Home / Self Care | Payer: Medicare Other | Source: Ambulatory Visit | Attending: Internal Medicine | Admitting: Internal Medicine

## 2019-04-22 DIAGNOSIS — R911 Solitary pulmonary nodule: Secondary | ICD-10-CM

## 2019-04-22 NOTE — Progress Notes (Signed)
@Patient  ID: [name], female    DOB: 09/05/1946, 73 y.o.   MRN: [id number]  Chief Complaint  Patient presents with   Follow-up    Patient states that she is here for CT results. She states that her breathing is doing well at this time.     Referring provider: Ria Bush, MD  HPI:  73 year old female former smoker followed in our office for COPD as well as history of pulmonary nodules  PMH: Hyperlipidemia, GERD, hypertension, heart failure, chronic kidney disease Smoker/ Smoking History: Former smoker.  Quit 1976.  15-pack-year smoking history. Maintenance: Breo Ellipta 200, Spiriva Handihaler Pt of: Dr. [name]  04/23/2019  - Visit   73 year old female former smoker followed in our office for COPD.  Patient also recently completed a CT scanning of the chest.  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  She does have increased anxiety today because she is waiting to hear her CT scan results.  Patient very concerned that she may have cancer.  Patient reports she does not cough or bring up mucus.  She has postnasal drip but she is unable to produce sputum.  Patient has not had to use her rescue inhaler.  Her current risk inhaler that she has on her is expired.  Patient continues to be maintained on Breo Ellipta 200 and Spiriva HandiHaler.    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  06/13/2018-CT super D chest without contrast- generally stable chronic lung disease with  pleural parenchymal scarring and scattered nodularity most of these nodules have been tree-in-bud in appearance likely postinfectious or inflammatory the dominant right lower lobe nodule seen on most recent study has resolved no new or enlarging nodules  03/13/2018-pulmonary function test- FVC 2.21 (70% predicted), postbronchodilator ratio, ratio 64, FEV1 1.42 (59% predicted), DLCO 91  FENO:  No results found for: NITRICOXIDE  PFT: PFT Results Latest Ref Rng & Units 03/13/2018 04/01/2016 11/29/2013  FVC-Pre L 2.21 2.22 1.49  FVC-Predicted Pre % 70 68 42  FVC-Post L - 2.33 1.60  FVC-Predicted Post % - 72 45  Pre FEV1/FVC % % 64 61 63  Post FEV1/FCV % % - 63 65  FEV1-Pre L 1.42 1.35 0.93  FEV1-Predicted Pre % 59 55 34  FEV1-Post L - 1.47 1.05  DLCO UNC% % 91 71 -  DLCO COR %Predicted % 102 95 -  TLC L - 5.00 -  TLC % Predicted % - 93 -  RV % Predicted % - 121 -    Imaging: Ct Chest Wo Contrast  Result Date: 04/22/2019 CLINICAL DATA:  Follow-up pulmonary nodules. Remote history of colon cancer. EXAM: CT CHEST WITHOUT CONTRAST TECHNIQUE: Multidetector CT imaging of the chest was performed following the standard protocol without IV contrast. COMPARISON:  06/13/2018 FINDINGS: Cardiovascular: Heart is normal in size.  No pericardial effusion. Ectasia of the ascending thoracic aorta, measuring up to 4.0 cm. Atherosclerotic calcifications the aortic root/valve and aortic arch. Mitral valve annular calcifications. Three vessel coronary atherosclerosis. Postsurgical changes related to prior CABG. Left subclavian pacemaker. Mediastinum/Nodes: Small mediastinal lymph nodes which do not meet pathologic CT size criteria. Visualized thyroid is  unremarkable. Lungs/Pleura: Biapical pleural-parenchymal scarring. Branching peribronchovascular nodularity in the right upper lobe (series 3/images 37 and 39), unchanged. New subpleural nodularity in the medial right upper lobe (series 3/image 50). Prior subpleural  nodularity in the medial right lower lobe has resolved. Additional scattered subpleural/peribronchovascular nodularity in the lungs bilaterally. Overall appearance is mildly waxing/waning and suggests a combination of post infectious/inflammatory scarring and atypical mycobacterial infection rather than metastatic disease. Trace right pleural effusion, possibly partially loculated, unchanged. Mild subpleural reticulation/fibrosis in the lungs bilaterally. No pneumothorax. Upper Abdomen: Visualized upper abdomen is notable for prior cholecystectomy and vascular calcifications. Musculoskeletal: Degenerative changes of the visualized thoracolumbar spine. Median sternotomy. IMPRESSION: Waxing/waning peribronchovascular nodularity in the lungs bilaterally, suggesting a combination of post infectious inflammatory scarring and atypical mycobacterial infection rather than metastatic disease. Trace right pleural effusion, unchanged. Aortic Atherosclerosis (ICD10-I70.0). Electronically Signed   By: Julian Hy [name]   On: 04/22/2019 15:00   Mm 3d Screen Breast Bilateral  Result Date: 04/05/2019 CLINICAL DATA:  Screening. EXAM: DIGITAL SCREENING BILATERAL MAMMOGRAM WITH TOMO AND CAD COMPARISON:  Previous exam(s). ACR Breast Density Category b: There are scattered areas of fibroglandular density. FINDINGS: There are no findings suspicious for malignancy. Images were processed with CAD. IMPRESSION: No mammographic evidence of malignancy. A result letter of this screening mammogram will be mailed directly to the patient. RECOMMENDATION: Screening mammogram in one year. (Code:SM-[id number]) BI-RADS CATEGORY  1: Negative. Electronically Signed   By: Margarette [place] [name]   On: 04/05/2019 16:48      Specialty Problems      Pulmonary Problems   COPD (chronic obstructive pulmonary disease) (HCC)    CAT score 02/23/2012 - 18      Pulmonary nodule    RLL nodule-60mm stable 2006, April 2009, and June 2009 Small pulm nodules  Jan 2012 - > Oct 2012 without change      Dyspnea on exertion    myoview low risk 02/2018      CAP (community acquired pneumonia)      Allergies  Allergen Reactions   Codeine Nausea Only   Prednisone Hives and Other (See Comments)    In different places all over the body.   Sulfa Antibiotics Nausea Only   Sulfonamide Derivatives Nausea Only    Immunization History  Administered Date(s) Administered   H1N1 09/17/2008   Influenza Split 07/18/2012   Influenza Whole 08/10/2005, 06/24/2009, 07/11/2011   Influenza, High Dose Seasonal PF 07/04/2016, 07/04/2017   Influenza,inj,Quad PF,6+ Mos 07/09/2018   Influenza-Unspecified 07/24/2013, 06/30/2014, 06/30/2015   Pneumococcal Conjugate-13 06/02/2014   Pneumococcal Polysaccharide-23 08/10/2005, 04/21/2008, 06/25/2013   Td 10/11/2005   Zoster 09/16/2014   Zoster Recombinat (Shingrix) 06/25/2018, 08/28/2018    Past Medical History:  Diagnosis Date   Acute cystitis without hematuria 12/22/2015   Anemia    Arthritis    CAP (community acquired pneumonia) 02/08/2016   Cataracts, bilateral    immature   Chronic diastolic heart failure ([place]) 09/19/2014   Chronic insomnia    Constipation    takes Miralax daily as needed   COPD (chronic obstructive pulmonary disease) (HCC)    Albuterol inhaler daily as needed;Duoneb daily as needed;Spiriva daily   DDD (degenerative disc disease)    cervical - kyphosis with mod DD changes C5/6 and C6/7; lumbar - early DD at L2/3 ([place])   Dyspnea    with exertion   Essential hypertension    was on meds but after appointment Dec 11 with Dr.C he took her off   GERD (  gastroesophageal reflux disease)    takes Protonix daily   History of pneumonia 02/16/2016   History of radiation therapy 05/30/11 to 07/07/11   rectum   History of shingles    Hyperlipemia    takes Lovastatin daily   Legally blind in left eye, as defined in [place]    Lung nodule    RLL nodule-72mm stable  2006, April 2009, and June 2009   Obesity    Osteopenia 12/2014   T -1.5 hip   Pancreatitis 11/2014   ?zpack related vs gallstone pancreatitis with abnormal HIDA scan pending cholecystectomy   PONV (postoperative nausea and vomiting)    Presence of permanent cardiac pacemaker    Rectal cancer ([place]) 04/2011   T3N0; s/p lap LAR, s/p ileostomy, s/p reversal, s/p chemo   Research study patient 08/26/2016   Rheumatic heart disease mitral stenosis    mod MS by echo 02/2014   S/P AVR (aortic valve replacement) 2015   bioprosthetic ([place])   S/P MVR (mitral valve replacement) 2015   bioprosthetic ([place])   Seizures (HCC)    febrile seizures as a child   Sepsis secondary to UTI ([place]) 08/22/2017   Severe aortic valve stenosis  AVR 3/15 10/01/2013   mild-mod by echo 02/2014   Sleep apnea    PT TOLD BORDERLINE-TRIED CPAP-DID NOT HELP-SHE DOES NOT USE CPAP   Small bowel obstruction, partial ([place]) 08/2013   reolved without NGT placement.    Vitamin B12 deficiency 02/28/2015   Start B12 shots 02/2015     Tobacco History: Social History   Tobacco Use  Smoking Status Former Smoker   Packs/day: 1.50   Years: 10.00   Pack years: 15.00   Types: Cigarettes   Quit date: 12/28/1974   Years since quitting: 44.3  Smokeless Tobacco Never Used  Tobacco Comment   quit smoking in 1976   Counseling given: Not Answered Comment: quit smoking in 1976   Continue to not smoke  Outpatient Encounter Medications as of 04/23/2019  Medication Sig   acetaminophen (TYLENOL) 325 MG tablet Take 650 mg by mouth every 6 (six) hours as needed for mild pain or headache.    albuterol (VENTOLIN HFA) 108 (90 Base) MCG/ACT inhaler Inhale 2 puffs into the lungs every 6 (six) hours as needed for wheezing.   amoxicillin (AMOXIL) 500 MG capsule TAKE 4 CAPSULES BY MOUTH 30-60 MINUTES PRIOR TO APPOINTMENT   Biotin 1000 MCG tablet Take 1,000 mcg by mouth daily.   BREO ELLIPTA 200-25 MCG/INH AEPB  INHALE 1 PUFF INTO THE LUNGS ONCE DAILY   Calcium Carb-Cholecalciferol (CALCIUM 600+D) 600-800 MG-UNIT TABS Take 1 tablet by mouth every evening.   Cholecalciferol (VITAMIN D) 2000 UNITS CAPS Take 2,000 Units by mouth every evening.    docusate sodium (COLACE) 100 MG capsule Take 200 mg by mouth every evening.   furosemide (LASIX) 40 MG tablet TAKE 1 TABLET BY MOUTH EVERY DAY 5 TIMES A WEEK   HYDROcodone-acetaminophen (NORCO/VICODIN) 5-325 MG tablet Take 1 tablet by mouth every 4 (four) hours as needed for severe pain ((score 7 to 10)).   ipratropium-albuterol (DUONEB) 0.5-2.5 (3) MG/3ML SOLN Take 3 mLs by nebulization every 6 (six) hours as needed.   lovastatin (MEVACOR) 40 MG tablet Take 1 tablet (40 mg total) by mouth at bedtime.   montelukast (SINGULAIR) 10 MG tablet TAKE 1 TABLET BY MOUTH AT BEDTIME   montelukast (SINGULAIR) 10 MG tablet Take 1 tablet (10 mg total) by mouth at bedtime.   Multiple Vitamins-Minerals (  PRESERVISION AREDS 2 PO) Take 2 capsules by mouth every evening.    nitrofurantoin, macrocrystal-monohydrate, (MACROBID) 100 MG capsule Take 100 mg by mouth every evening.    polycarbophil (FIBERCON) 625 MG tablet Take 625 mg by mouth every evening.    polyethylene glycol (MIRALAX / GLYCOLAX) packet Take 17 g by mouth daily.   potassium chloride (K-DUR) 10 MEQ tablet TAKE 2 TABLETS BY MOUTH ONCE DAILY   Talc (ZEASORB EX) Apply 1 application topically daily. Under skin folds (abdomen)   tiotropium (SPIRIVA HANDIHALER) 18 MCG inhalation capsule INHALE THE CONTENTS OF ONE CAPSULE IN THE HANDIHALER ONCE DAILY   TOVIAZ 8 MG TB24 tablet Take 8 mg by mouth every evening.    traMADol (ULTRAM) 50 MG tablet Take 0.5-1 tablets (25-50 mg total) by mouth 2 (two) times daily as needed for moderate pain.   vitamin B-12 (CYANOCOBALAMIN) 500 MCG tablet Take 500 mcg by mouth every evening.    XARELTO 20 MG TABS tablet TAKE 1 TABLET(20 MG) BY MOUTH DAILY WITH SUPPER    [DISCONTINUED] albuterol (PROVENTIL HFA;VENTOLIN HFA) 108 (90 Base) MCG/ACT inhaler Inhale 2 puffs into the lungs every 6 (six) hours as needed for wheezing.   Facility-Administered Encounter Medications as of 04/23/2019  Medication   ondansetron (ZOFRAN) 4 mg in sodium chloride 0.9 % 50 mL IVPB     Review of Systems  Review of Systems  Constitutional: Positive for fatigue. Negative for activity change and fever.  HENT: Negative for sinus pressure, sinus pain and sore throat.   Respiratory: Negative for cough, shortness of breath and wheezing.   Cardiovascular: Negative for chest pain and palpitations.  Gastrointestinal: Negative for diarrhea, nausea and vomiting.  Musculoskeletal: Negative for arthralgias.  Neurological: Negative for dizziness.  Psychiatric/Behavioral: Negative for sleep disturbance. The patient is nervous/anxious (about ct scan ).      Physical Exam  BP 120/68 (BP Location: Left Arm, Cuff Size: Normal)    Pulse 79    Temp 98 F (36.7 C) (Oral)    Ht 5\' 6"  (1.676 m)    Wt 140 lb 9.6 oz (63.8 kg)    SpO2 97%    BMI 22.69 kg/m   Wt Readings from Last 5 Encounters:  04/23/19 140 lb 9.6 oz (63.8 kg)  04/16/19 138 lb (62.6 kg)  12/10/18 144 lb (65.3 kg)  11/23/18 144 lb (65.3 kg)  07/11/18 149 lb 8 oz (67.8 kg)    Physical Exam Vitals signs and nursing note reviewed.  Constitutional:      General: She is not in acute distress.    Appearance: Normal appearance.     Comments: Elderly female  HENT:     Head: Normocephalic and atraumatic.     Right Ear: Tympanic membrane, ear canal and external ear normal. There is no impacted cerumen.     Left Ear: Tympanic membrane, ear canal and external ear normal. There is no impacted cerumen.     Nose: Nose normal. No congestion.     Mouth/Throat:     Mouth: Mucous membranes are moist.     Pharynx: Oropharynx is clear.  Eyes:     Pupils: Pupils are equal, round, and reactive to light.  Neck:     Musculoskeletal:  Normal range of motion.  Cardiovascular:     Rate and Rhythm: Normal rate and regular rhythm.     Pulses: Normal pulses.     Heart sounds: No murmur.  Pulmonary:     Effort: Pulmonary effort is normal. No  respiratory distress.     Breath sounds: Normal breath sounds. No decreased air movement. No decreased breath sounds, wheezing or rales.  Skin:    General: Skin is warm and dry.     Capillary Refill: Capillary refill takes less than 2 seconds.  Neurological:     General: No focal deficit present.     Mental Status: She is alert and oriented to person, place, and time. Mental status is at baseline.     Gait: Gait normal.  Psychiatric:        Mood and Affect: Mood normal.        Behavior: Behavior normal.        Thought Content: Thought content normal.        Judgment: Judgment normal.      Lab Results:  CBC    Component Value Date/Time   WBC 6.7 11/23/2018 1455   WBC 6.8 07/23/2018 0941   RBC 4.18 11/23/2018 1455   RBC 4.03 07/23/2018 0941   HGB 13.6 11/23/2018 1455   HGB 13.7 11/15/2012 0908   HCT 41.5 11/23/2018 1455   HCT 39.4 11/15/2012 0908   PLT 145 (L) 11/23/2018 1455   MCV 99 (H) 11/23/2018 1455   MCV 93.1 11/15/2012 0908   MCH 32.5 11/23/2018 1455   MCH 32.5 07/23/2018 0941   MCHC 32.8 11/23/2018 1455   MCHC 31.9 07/23/2018 0941   RDW 11.8 11/23/2018 1455   RDW 14.4 11/15/2012 0908   LYMPHSABS 0.7 05/28/2018 0941   LYMPHSABS 0.9 11/15/2012 0908   MONOABS 0.8 05/28/2018 0941   MONOABS 0.5 11/15/2012 0908   EOSABS 0.1 05/28/2018 0941   EOSABS 0.2 11/15/2012 0908   BASOSABS 0.0 05/28/2018 0941   BASOSABS 0.1 11/15/2012 0908    BMET    Component Value Date/Time   NA 140 04/23/2019 0748   NA 141 11/23/2018 1455   NA 143 07/28/2014 0848   K 3.9 04/23/2019 0748   K 3.6 07/28/2014 0848   CL 103 04/23/2019 0748   CL 104 11/15/2012 0908   CO2 32 04/23/2019 0748   CO2 30 (H) 07/28/2014 0848   GLUCOSE 85 04/23/2019 0748   GLUCOSE 96 07/28/2014 0848    GLUCOSE 93 11/15/2012 0908   BUN 19 04/23/2019 0748   BUN 23 11/23/2018 1455   BUN 19.7 07/28/2014 0848   CREATININE 0.71 04/23/2019 0748   CREATININE 1.2 (H) 07/28/2014 0848   CALCIUM 9.3 04/23/2019 0748   CALCIUM 10.0 07/28/2014 0848   GFRNONAA 65 11/23/2018 1455   GFRAA 75 11/23/2018 1455    BNP    Component Value Date/Time   BNP 211.0 (H) 11/16/2014 1447    ProBNP    Component Value Date/Time   PROBNP 204.0 (H) 06/26/2015 0928      Assessment & Plan:   COPD (chronic obstructive pulmonary disease) ([place]) Plan: Continue Spiriva HandiHaler Continue Breo 200 Continue rescue inhaler, refilled today  Abnormal findings on diagnostic imaging of lung Discussion: 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 Purnell Shoemaker.   Lauraine Rinne, NP 04/23/2019   This appointment  was 26 minutes long with over 50% of the time in direct face-to-face patient care, assessment, plan of care, and follow-up.

## 2019-04-23 ENCOUNTER — Other Ambulatory Visit (INDEPENDENT_AMBULATORY_CARE_PROVIDER_SITE_OTHER): Payer: Medicare Other

## 2019-04-23 ENCOUNTER — Encounter: Payer: Self-pay | Admitting: Pulmonary Disease

## 2019-04-23 ENCOUNTER — Ambulatory Visit (INDEPENDENT_AMBULATORY_CARE_PROVIDER_SITE_OTHER): Payer: Medicare Other | Admitting: Pulmonary Disease

## 2019-04-23 ENCOUNTER — Ambulatory Visit: Payer: Medicare Other

## 2019-04-23 VITALS — BP 120/68 | HR 79 | Temp 98.0°F | Ht 66.0 in | Wt 140.6 lb

## 2019-04-23 DIAGNOSIS — J449 Chronic obstructive pulmonary disease, unspecified: Secondary | ICD-10-CM

## 2019-04-23 DIAGNOSIS — E538 Deficiency of other specified B group vitamins: Secondary | ICD-10-CM

## 2019-04-23 DIAGNOSIS — I10 Essential (primary) hypertension: Secondary | ICD-10-CM | POA: Diagnosis not present

## 2019-04-23 DIAGNOSIS — E559 Vitamin D deficiency, unspecified: Secondary | ICD-10-CM | POA: Diagnosis not present

## 2019-04-23 DIAGNOSIS — N183 Chronic kidney disease, stage 3 unspecified: Secondary | ICD-10-CM

## 2019-04-23 DIAGNOSIS — E785 Hyperlipidemia, unspecified: Secondary | ICD-10-CM | POA: Diagnosis not present

## 2019-04-23 DIAGNOSIS — R918 Other nonspecific abnormal finding of lung field: Secondary | ICD-10-CM | POA: Diagnosis not present

## 2019-04-23 DIAGNOSIS — L659 Nonscarring hair loss, unspecified: Secondary | ICD-10-CM | POA: Diagnosis not present

## 2019-04-23 HISTORY — DX: Other nonspecific abnormal finding of lung field: R91.8

## 2019-04-23 LAB — MICROALBUMIN / CREATININE URINE RATIO
Creatinine,U: 118.4 mg/dL
Microalb Creat Ratio: 4.6 mg/g (ref 0.0–30.0)
Microalb, Ur: 5.4 mg/dL — ABNORMAL HIGH (ref 0.0–1.9)

## 2019-04-23 LAB — COMPREHENSIVE METABOLIC PANEL
ALT: 36 U/L — ABNORMAL HIGH (ref 0–35)
AST: 42 U/L — ABNORMAL HIGH (ref 0–37)
Albumin: 3.8 g/dL (ref 3.5–5.2)
Alkaline Phosphatase: 64 U/L (ref 39–117)
BUN: 19 mg/dL (ref 6–23)
CO2: 32 mEq/L (ref 19–32)
Calcium: 9.3 mg/dL (ref 8.4–10.5)
Chloride: 103 mEq/L (ref 96–112)
Creatinine, Ser: 0.71 mg/dL (ref 0.40–1.20)
GFR: 80.51 mL/min (ref >60.00)
Glucose, Bld: 85 mg/dL (ref 70–99)
Potassium: 3.9 mEq/L (ref 3.5–5.1)
Sodium: 140 mEq/L (ref 135–145)
Total Bilirubin: 0.8 mg/dL (ref 0.2–1.2)
Total Protein: 6.8 g/dL (ref 6.0–8.3)

## 2019-04-23 LAB — TSH: TSH: 2.24 u[IU]/mL (ref 0.35–4.50)

## 2019-04-23 LAB — VITAMIN B12: Vitamin B-12: 1048 pg/mL — ABNORMAL HIGH (ref 211–911)

## 2019-04-23 LAB — VITAMIN D 25 HYDROXY (VIT D DEFICIENCY, FRACTURES): VITD: 47.87 ng/mL (ref 30.00–100.00)

## 2019-04-23 LAB — IBC + FERRITIN
Ferritin: 88 ng/mL (ref 10.0–291.0)
Iron: 138 ug/dL (ref 42–145)
Saturation Ratios: 45.4 % (ref 20.0–50.0)
Transferrin: 217 mg/dL (ref 212.0–360.0)

## 2019-04-23 LAB — LIPID PANEL
Cholesterol: 135 mg/dL (ref 0–200)
HDL: 56.6 mg/dL (ref >39.00)
LDL Cholesterol: 69 mg/dL (ref 0–99)
NonHDL: 78.19
Total CHOL/HDL Ratio: 2
Triglycerides: 47 mg/dL (ref 0.0–149.0)
VLDL: 9.4 mg/dL (ref 0.0–40.0)

## 2019-04-23 MED ORDER — ALBUTEROL SULFATE HFA 108 (90 BASE) MCG/ACT IN AERS: 2.0000 | INHALATION_SPRAY | Freq: Four times a day (QID) | RESPIRATORY_TRACT | 6 refills | Status: DC | PRN

## 2019-04-23 NOTE — Patient Instructions (Addendum)
We will talk to Dr. [name] regarding your scans.  For right now we can monitor you clinically.  If your symptoms worsen we may need to consider culturing your sputum or a bronchoscopy.  Continue Breo Ellipta 200 >>> Take 1 puff daily in the morning right when you wake up >>>Rinse your mouth out after use >>>This is a daily maintenance inhaler, NOT a rescue inhaler >>>Contact our office if you are having difficulties affording or obtaining this medication >>>It is important for you to be able to take this daily and not miss any doses  Continue Spiriva Handihaler   Only use your albuterol as a rescue medication to be used if you can't catch your breath by resting or doing a relaxed purse lip breathing pattern.  - The less you use it, the better it will work when you need it. - Ok to use up to 2 puffs  every 4 hours if you must but call for immediate appointment if use goes up over your usual need - Don't leave home without it !!  (think of it like the spare tire for your car)    Note your daily symptoms > remember "red flags" for COPD:   >>>Increase in cough >>>increase in sputum production >>>increase in shortness of breath or activity  intolerance.   If you notice these symptoms, please call the office to be seen.     Return in about 6 months (around 10/24/2019), or if symptoms worsen or fail to improve, for Follow up with Dr. Purnell Shoemaker.   Coronavirus (COVID-19) Are you at risk?  Are you at risk for the Coronavirus (COVID-19)?  To be considered HIGH RISK for Coronavirus (COVID-19), you have to meet the following criteria:  . Traveled to [place], [place], [place], [place] or [place]; or in the [place] to [place], [place], [place], or [place]; and have fever, cough, and shortness of breath within the last 2 weeks of travel OR . Been in close contact with a person diagnosed with COVID-19 within the last 2 weeks and have fever, cough, and shortness of breath . IF YOU  DO NOT MEET THESE CRITERIA, YOU ARE CONSIDERED LOW RISK FOR COVID-19.  What to do if you are HIGH RISK for COVID-19?  Marland Kitchen If you are having a medical emergency, call 911. . Seek medical care right away. Before you go to a doctor's office, urgent care or emergency department, call ahead and tell them about your recent travel, contact with someone diagnosed with COVID-19, and your symptoms. You should receive instructions from your physician's office regarding next steps of care.  . When you arrive at healthcare provider, tell the healthcare staff immediately you have returned from visiting [place], [place], [place], [place] or [place]; or traveled in the [place] to [place], [place], [place], or [place]; in the last two weeks or you have been in close contact with a person diagnosed with COVID-19 in the last 2 weeks.   . Tell the health care staff about your symptoms: fever, cough and shortness of breath. . After you have been seen by a medical provider, you will be either: o Tested for (COVID-19) and discharged home on quarantine except to seek medical care if symptoms worsen, and asked to  - Stay home and avoid contact with others until you get your results (4-5 days)  - Avoid travel on public transportation if possible (such as bus, train, or airplane) or o Sent to the Emergency Department  by EMS for evaluation, COVID-19 testing, and possible admission depending on your condition and test results.  What to do if you are LOW RISK for COVID-19?  Reduce your risk of any infection by using the same precautions used for avoiding the common cold or flu:  Marland Kitchen Wash your hands often with soap and warm water for at least 20 seconds.  If soap and water are not readily available, use an alcohol-based hand sanitizer with at least 60% alcohol.  . If coughing or sneezing, cover your mouth and nose by coughing or sneezing into the elbow areas of your shirt or coat, into a tissue or into your sleeve (not  your hands). . Avoid shaking hands with others and consider head nods or verbal greetings only. . Avoid touching your eyes, nose, or mouth with unwashed hands.  . Avoid close contact with people who are sick. . Avoid places or events with large numbers of people in one location, like concerts or sporting events. . Carefully consider travel plans you have or are making. . If you are planning any travel outside or inside the [place], visit the CDC's Travelers' Health webpage for the latest health notices. . If you have some symptoms but not all symptoms, continue to monitor at home and seek medical attention if your symptoms worsen. . If you are having a medical emergency, call 911.   [place] / e-Visit: [email or web address]         [facility]: [facility]: [phone]                   [facility]: [phone]           It is flu season:   >>> Best ways to protect herself from the flu: Receive the yearly flu vaccine, practice good hand hygiene washing with soap and also using hand sanitizer when available, eat a nutritious meals, get adequate rest, hydrate appropriately   Please contact the office if your symptoms worsen or you have concerns that you are not improving.   Thank you for choosing [facility] for your healthcare, and for allowing [place] to partner with you on your healthcare journey. I am thankful to be able to provide care to you today.   Wyn Quaker FNP-C

## 2019-04-23 NOTE — Addendum Note (Signed)
Addended by: Cloyd Stagers on: 04/23/2019 01:03 PM   Modules accepted: Orders

## 2019-04-23 NOTE — Assessment & Plan Note (Signed)
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

## 2019-04-23 NOTE — Assessment & Plan Note (Signed)
Plan: Continue Spiriva HandiHaler Continue Breo 200 Continue rescue inhaler, refilled today

## 2019-04-29 ENCOUNTER — Telehealth: Payer: Self-pay

## 2019-04-29 MED ORDER — SPIRIVA HANDIHALER 18 MCG IN CAPS: ORAL_CAPSULE | RESPIRATORY_TRACT | 5 refills | Status: DC

## 2019-04-29 NOTE — Telephone Encounter (Signed)
   [facility] Pre-operative Risk Assessment    Request for surgical clearance:  1. What type of surgery is being performed? Total spine myelogram/post myelogram CT   2. When is this surgery scheduled? 05/08/2019   3. What type of clearance is required (medical clearance vs. Pharmacy clearance to hold med vs. Both)? Both  4. Are there any medications that need to be held prior to surgery and how long? Xarelto 5 days prior    5. Practice name and name of physician performing surgery? [facility], Dr. Kristeen Miss   6. What is your office phone number [phone]   7.   What is your office fax number [phone]  8.   Anesthesia type (None, local, MAC, general) ? None Specified    Mendel Ryder 04/29/2019, 2:24 PM  _________________________________________________________________   (provider comments below)

## 2019-04-29 NOTE — Telephone Encounter (Signed)
Replied to patient's MyChart message, asking her to contact Dr. Clarice Pole office.  I tried to reach them but office does not open until 9 am.

## 2019-04-30 NOTE — Telephone Encounter (Signed)
   Primary Cardiologist: Sanda Klein, MD  Chart reviewed as part of pre-operative protocol coverage. Patient was contacted 04/30/2019 in reference to pre-operative risk assessment for pending surgery as outlined below.  [name] was last seen on 04/16/2019 by Dr. Curt Bears.  Since that day, [name] has done very well form a cardiac perspective. She has had no SOB, chest pain, dizziness or syncope.  Per pharmacy: Patient with diagnosis of afib on Xarelto for anticoagulation.    Procedure: Total spine myelogram/post myelogram CT Date of procedure: 05/08/2019  CHADS2-VASc score of  4 (CHF, HTN, AGE, female)  CrCl 66 ml/min  It will be acceptable to hold Xarelto 3 days prior to procedure and resume as soon as possible thereafter per procedure team.    Therefore, based on ACC/AHA guidelines, the patient would be at acceptable risk for the planned procedure without further cardiovascular testing.   I will route this recommendation to the requesting party via Epic fax function and remove from pre-op pool.  Please call with questions.   Kathyrn Drown, NP 04/30/2019, 10:01 AM

## 2019-04-30 NOTE — Telephone Encounter (Signed)
Patient with diagnosis of afib on Xarelto for anticoagulation.    Procedure: Total spine myelogram/post myelogram CT  Date of procedure: 05/08/2019  CHADS2-VASc score of  4 (CHF, HTN, AGE, female)  CrCl 66 ml/min  Ok to hold Xarelto 3 days prior to procedure.

## 2019-05-02 ENCOUNTER — Ambulatory Visit (INDEPENDENT_AMBULATORY_CARE_PROVIDER_SITE_OTHER): Payer: Medicare Other | Admitting: Family Medicine

## 2019-05-02 ENCOUNTER — Ambulatory Visit (INDEPENDENT_AMBULATORY_CARE_PROVIDER_SITE_OTHER): Payer: Medicare Other | Admitting: *Deleted

## 2019-05-02 ENCOUNTER — Other Ambulatory Visit: Payer: Self-pay

## 2019-05-02 ENCOUNTER — Encounter: Payer: Self-pay | Admitting: Family Medicine

## 2019-05-02 VITALS — BP 142/72 | HR 90 | Temp 98.0°F | Ht 66.0 in | Wt 138.4 lb

## 2019-05-02 DIAGNOSIS — Z6822 Body mass index (BMI) 22.0-22.9, adult: Secondary | ICD-10-CM

## 2019-05-02 DIAGNOSIS — I10 Essential (primary) hypertension: Secondary | ICD-10-CM

## 2019-05-02 DIAGNOSIS — Z85048 Personal history of other malignant neoplasm of rectum, rectosigmoid junction, and anus: Secondary | ICD-10-CM

## 2019-05-02 DIAGNOSIS — N183 Chronic kidney disease, stage 3 unspecified: Secondary | ICD-10-CM

## 2019-05-02 DIAGNOSIS — F4321 Adjustment disorder with depressed mood: Secondary | ICD-10-CM

## 2019-05-02 DIAGNOSIS — Z0000 Encounter for general adult medical examination without abnormal findings: Secondary | ICD-10-CM

## 2019-05-02 DIAGNOSIS — I441 Atrioventricular block, second degree: Secondary | ICD-10-CM

## 2019-05-02 DIAGNOSIS — G8929 Other chronic pain: Secondary | ICD-10-CM

## 2019-05-02 DIAGNOSIS — M8580 Other specified disorders of bone density and structure, unspecified site: Secondary | ICD-10-CM

## 2019-05-02 DIAGNOSIS — N3946 Mixed incontinence: Secondary | ICD-10-CM

## 2019-05-02 DIAGNOSIS — R55 Syncope and collapse: Secondary | ICD-10-CM

## 2019-05-02 DIAGNOSIS — R7401 Elevation of levels of liver transaminase levels: Secondary | ICD-10-CM

## 2019-05-02 DIAGNOSIS — E785 Hyperlipidemia, unspecified: Secondary | ICD-10-CM

## 2019-05-02 DIAGNOSIS — M545 Low back pain, unspecified: Secondary | ICD-10-CM

## 2019-05-02 LAB — CUP PACEART REMOTE DEVICE CHECK
Battery Remaining Longevity: 107 mo
Battery Remaining Percentage: 95.5 %
Battery Voltage: 2.99 V
Brady Statistic AP VP Percent: 86 %
Brady Statistic AP VS Percent: 1 %
Brady Statistic AS VP Percent: 14 %
Brady Statistic AS VS Percent: 1 %
Brady Statistic RA Percent Paced: 86 %
Brady Statistic RV Percent Paced: 99 %
Date Time Interrogation Session: 20200723092117
Implantable Lead Implant Date: 20170907
Implantable Lead Implant Date: 20170907
Implantable Lead Location: 753859
Implantable Lead Location: 753860
Implantable Pulse Generator Implant Date: 20170907
Lead Channel Impedance Value: 400 Ohm
Lead Channel Impedance Value: 530 Ohm
Lead Channel Sensing Intrinsic Amplitude: 2.8 mV
Lead Channel Setting Pacing Amplitude: 0.875
Lead Channel Setting Pacing Amplitude: 2 V
Lead Channel Setting Pacing Pulse Width: 0.5 ms
Lead Channel Setting Sensing Sensitivity: 2 mV
Pulse Gen Model: 2272
Pulse Gen Serial Number: 7945283

## 2019-05-02 MED ORDER — ACETAMINOPHEN 500 MG PO TABS: 1000.0000 mg | ORAL_TABLET | Freq: Every day | ORAL | Status: DC

## 2019-05-02 NOTE — Progress Notes (Signed)
This visit was conducted in person.  BP (!) 142/72 (BP Location: Right Arm, Patient Position: Sitting, Cuff Size: Normal)   Pulse 90   Temp 98 F (36.7 C) (Temporal)   Ht 5\' 6"  (1.676 m)   Wt 138 lb 6.4 oz (62.8 kg)   SpO2 97%   BMI 22.34 kg/m    CC: AMW Subjective:    Patient ID: [name], female    DOB: 12-04-1945, 73 y.o.   MRN: [id number]  HPI: [name] is a 73 y.o. female presenting on 05/02/2019 for Medicare Wellness   Did not see health coach this year.  No exam data present  Passes depression/fall screens.    COPD - on spiriva, breo, singulair.  Continued weight loss noted - another 20 lbs in the last year (total ~80 lbs since 01-05-2018). Continues using weight watchers.  Sees urology - on toviaz and macrobid 100mg  daily. Ongoing back pain - planned myelogram next week ([facility]).   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   Concern for mycobacterium infection from recent CT scans followed by pulmonology.   Preventative: COLONOSCOPY WITH PROPOFOL 06/09/2016 patent colo-colonic anastomosis, rpt 5 yrs Ardis Hughs) Well woman - none recently. Had normal paps in past and would like to age out of cervical cancer screening. Discussed other GYN cancers to watch for. Mammogram 08-Apr-2019 WNL.  DEXA - T -1.7 hip (06/2018). Doesn't drink milk. Eats yogurt every morning.  Flu shot yearly  Pnuemovax 06/2013, prevnar 05/2014 Td 01/05/2006, deferred for now  zostavax - 09/2014 shingrix - completed 01/05/2018 Advanced directives: scanned and in chart 10/2014. Has living will at home. HCPOA is husband (deceased) then son. Has updated at home, asked to bring [place] copy. Seat belt use discussed Sunscreen use discussed. No changing moles on skin. Non smoker Alcohol - 1 glass wine a few nights a month Dentist - q6 mo Eye exam yearly monitoring cataracts   Widow. Husband died 04-08-15 MVA. 1 cat.  2 sons; 1 grandchild. One son died of MI 56 Married 01/05/1965  Retired-[employer], volunteers at [employer], to start at [facility] [employer] [facility] in [place] Activity: pulm rehab  Diet: some water, fruits/vegetables daily, yogurt daily     Relevant past medical, surgical, family and social history reviewed and updated as indicated. Interim medical history since our last visit reviewed. Allergies and medications reviewed and updated. Outpatient Medications Prior to Visit  Medication Sig Dispense Refill  . albuterol (VENTOLIN HFA) 108 (90 Base) MCG/ACT inhaler Inhale 2 puffs into the lungs every 6 (six) hours as needed for wheezing. 18 g 6  . amoxicillin (AMOXIL) 500 MG capsule TAKE 4 CAPSULES BY MOUTH 30-60 MINUTES PRIOR TO APPOINTMENT 4 capsule 12  . Biotin 1000 MCG tablet Take 1,000 mcg by mouth daily.    Marland Kitchen BREO ELLIPTA 200-25 MCG/INH AEPB INHALE 1 PUFF INTO THE LUNGS ONCE DAILY 60 each 3  . Calcium Carb-Cholecalciferol (CALCIUM 600+D) 600-800 MG-UNIT TABS Take 1 tablet by mouth every evening.    . Cholecalciferol (VITAMIN D) 2000 UNITS CAPS Take 2,000 Units by mouth every evening.     . docusate sodium (COLACE) 100 MG capsule Take 200 mg by mouth every evening.    . furosemide (LASIX) 40 MG tablet TAKE 1 TABLET BY MOUTH EVERY DAY 5 TIMES A WEEK 20 tablet 11  . HYDROcodone-acetaminophen (NORCO/VICODIN) 5-325 MG tablet Take 1 tablet by mouth every 4 (  four) hours as needed for severe pain ((score 7 to 10)). 40 tablet 0  . ipratropium-albuterol (DUONEB) 0.5-2.5 (3) MG/3ML SOLN Take 3 mLs by nebulization every 6 (six) hours as needed. 360 mL 0  . lovastatin (MEVACOR) 40 MG tablet Take 1 tablet (40 mg total) by mouth at bedtime. 90 tablet 0  . montelukast (SINGULAIR) 10 MG tablet Take 1 tablet (10 mg total) by mouth at bedtime. 30 tablet 3  . Multiple Vitamins-Minerals (EMERGEN-C IMMUNE PO) Take 1 packet by mouth daily.    . Multiple Vitamins-Minerals (PRESERVISION AREDS 2 PO)  Take 2 capsules by mouth every evening.     . nitrofurantoin, macrocrystal-monohydrate, (MACROBID) 100 MG capsule Take 100 mg by mouth every evening.     . polycarbophil (FIBERCON) 625 MG tablet Take 625 mg by mouth every evening.     . polyethylene glycol (MIRALAX / GLYCOLAX) packet Take 17 g by mouth daily. 14 each 0  . potassium chloride (K-DUR) 10 MEQ tablet TAKE 2 TABLETS BY MOUTH ONCE DAILY 60 tablet 4  . Talc (ZEASORB EX) Apply 1 application topically daily. Under skin folds (abdomen)    . tiotropium (SPIRIVA HANDIHALER) 18 MCG inhalation capsule INHALE THE CONTENTS OF ONE CAPSULE IN THE HANDIHALER ONCE DAILY 30 capsule 5  . TOVIAZ 8 MG TB24 tablet Take 8 mg by mouth every evening.   11  . traMADol (ULTRAM) 50 MG tablet Take 0.5-1 tablets (25-50 mg total) by mouth 2 (two) times daily as needed for moderate pain. 30 tablet 0  . vitamin B-12 (CYANOCOBALAMIN) 500 MCG tablet Take 500 mcg by mouth every evening.     Alveda Reasons 20 MG TABS tablet TAKE 1 TABLET(20 MG) BY MOUTH DAILY WITH SUPPER 30 tablet 9  . acetaminophen (TYLENOL) 325 MG tablet Take 650 mg by mouth every 6 (six) hours as needed for mild pain or headache.     . montelukast (SINGULAIR) 10 MG tablet TAKE 1 TABLET BY MOUTH AT BEDTIME (Patient not taking: Reported on 05/02/2019) 30 tablet 5   Facility-Administered Medications Prior to Visit  Medication Dose Route Frequency Provider Last Rate Last Dose  . ondansetron (ZOFRAN) 4 mg in sodium chloride 0.9 % 50 mL IVPB  4 mg Intravenous Q6H PRN Kristeen Miss, MD         Per HPI unless specifically indicated in ROS section below Review of Systems  Constitutional: Negative for activity change, appetite change, chills, fatigue, fever and unexpected weight change.  HENT: Negative for hearing loss.   Eyes: Negative for visual disturbance.  Respiratory: Negative for cough, chest tightness, shortness of breath and wheezing.   Cardiovascular: Negative for chest pain, palpitations and leg  swelling.  Gastrointestinal: Negative for abdominal distention, abdominal pain, blood in stool, constipation, diarrhea, nausea and vomiting.  Genitourinary: Negative for difficulty urinating and hematuria.  Musculoskeletal: Negative for arthralgias, myalgias and neck pain.  Skin: Negative for rash.  Neurological: Negative for dizziness, seizures, syncope and headaches.  Hematological: Negative for adenopathy. Bruises/bleeds easily.  Psychiatric/Behavioral: Negative for dysphoric mood. The patient is not nervous/anxious.    Objective:    BP (!) 142/72 (BP Location: Right Arm, Patient Position: Sitting, Cuff Size: Normal)   Pulse 90   Temp 98 F (36.7 C) (Temporal)   Ht 5\' 6"  (1.676 m)   Wt 138 lb 6.4 oz (62.8 kg)   SpO2 97%   BMI 22.34 kg/m   Wt Readings from Last 3 Encounters:  05/02/19 138 lb 6.4 oz (62.8 kg)  04/23/19 140 lb 9.6 oz (63.8 kg)  04/16/19 138 lb (62.6 kg)    Physical Exam Vitals signs and nursing note reviewed.  Constitutional:      General: She is not in acute distress.    Appearance: Normal appearance. She is well-developed. She is not ill-appearing.  HENT:     Head: Normocephalic and atraumatic.     Right Ear: Hearing, tympanic membrane, ear canal and external ear normal.     Left Ear: Hearing, tympanic membrane, ear canal and external ear normal.     Nose: Nose normal.     Mouth/Throat:     Mouth: Mucous membranes are moist.     Pharynx: Uvula midline. No oropharyngeal exudate or posterior oropharyngeal erythema.  Eyes:     General: No scleral icterus.    Extraocular Movements: Extraocular movements intact.     Conjunctiva/sclera: Conjunctivae normal.     Pupils: Pupils are equal, round, and reactive to light.  Neck:     Musculoskeletal: Normal range of motion and neck supple.  Cardiovascular:     Rate and Rhythm: Normal rate and regular rhythm.     Pulses: Normal pulses.          Radial pulses are 2+ on the right side and 2+ on the left side.      Heart sounds: Murmur present.  Pulmonary:     Effort: Pulmonary effort is normal. No respiratory distress.     Breath sounds: Normal breath sounds. No wheezing, rhonchi or rales.  Abdominal:     General: Bowel sounds are normal. There is no distension.     Palpations: Abdomen is soft. There is no mass.     Tenderness: There is no abdominal tenderness. There is no guarding or rebound.  Lymphadenopathy:     Cervical: No cervical adenopathy.  Skin:    General: Skin is warm and dry.     Findings: No rash.  Neurological:     General: No focal deficit present.     Mental Status: She is alert and oriented to person, place, and time.     Comments:  CN grossly intact, station and gait intact Recall 3/3 Calculation D-L-R-O-W  Psychiatric:        Mood and Affect: Mood normal.        Behavior: Behavior normal.        Thought Content: Thought content normal.        Judgment: Judgment normal.       Assessment & Plan:   Problem List Items Addressed This Visit    Transaminitis    New, of unclear cause. Denies abd pain. Will recheck in 2 months, consider abd [place] if persists.       Relevant Orders   Hepatic function panel   Hepatitis panel, acute   CBC with Differential/Platelet   Personal history of rectal cancer    Stable period without recurrence. Rpt colonscopy planned 2022.      Osteopenia    Stable on rpt dexa 2019      Mixed stress and urge urinary incontinence    Appreciate uro care - on tovia and daily macrobid.       Medicare annual wellness visit, subsequent - Primary    I have personally reviewed the Medicare Annual Wellness questionnaire and have noted 1. The patient's medical and social history 2. Their use of alcohol, tobacco or illicit drugs 3. Their current medications and supplements 4. The patient's functional ability including ADL's, fall risks, home safety  risks and hearing or visual impairment. Cognitive function has been assessed and addressed as indicated.   5. Diet and physical activity 6. Evidence for depression or mood disorders The patients weight, height, BMI have been recorded in the chart. I have made referrals, counseling and provided education to the patient based on review of the above and I have provided the pt with a written personalized care plan for preventive services. Provider list updated.. See scanned questionairre as needed for further documentation. Reviewed preventative protocols and updated unless pt declined.       HLD (hyperlipidemia)    Chronic, stable on lovastatin.  The 10-year ASCVD risk score Mikey Bussing DC Brooke Bonito., et al., 2013) is: 19.4%   Values used to calculate the score:     Age: 74 years     Sex: Female     Is Non-Hispanic African American: No     Diabetic: No     Tobacco smoker: No     Systolic Blood Pressure: 563 mmHg     Is BP treated: Yes     HDL Cholesterol: 56.6 mg/dL     Total Cholesterol: 135 mg/dL       Health maintenance examination    Preventative protocols reviewed and updated unless pt declined. Discussed healthy diet and lifestyle.       Essential hypertension, benign    Stable period off medication besides lasix.       CKD (chronic kidney disease) stage 3, GFR 30-59 ml/min (HCC)    Kidney function has returned to normal. Will continue to monitor.       Chronic lumbar pain    Planning myelogram next week ([facility])      Relevant Medications   acetaminophen (TYLENOL) 500 MG tablet   BMI 22.0-22.9, adult    Reviewed ongoing weight loss (80lbs total) - she continues participating in [employer]. Recommend against further weight loss - she desires to maintain at current weight. Will let me know if ongoing weight loss.       Adjustment disorder with depressed mood    Stable period off medication.           Meds ordered this encounter  Medications  . acetaminophen (TYLENOL) 500 MG tablet    Sig: Take 2 tablets (1,000 mg total) by mouth daily.   Orders Placed This Encounter   Procedures  . Hepatic function panel    Standing Status:   Future    Standing Expiration Date:   05/03/2020  . Hepatitis panel, acute    Standing Status:   Future    Standing Expiration Date:   05/03/2020  . CBC with Differential/Platelet    Standing Status:   Future    Standing Expiration Date:   05/03/2020    Patient instructions: Return in 6 months for follow up visit. Return in 2-3 months for repeat labwork only (check liver).  You are doing well today.   Follow up plan: Return in about 6 months (around 11/02/2019) for follow up visit.  Ria Bush, MD

## 2019-05-02 NOTE — Patient Instructions (Addendum)
Return in 6 months for follow up visit. Return in 2-3 months for repeat labwork only (check liver).  You are doing well today.   Health Maintenance After Age 74 After age 49, you are at a higher risk for certain long-term diseases and infections as well as injuries from falls. Falls are a major cause of broken bones and head injuries in people who are older than age 70. Getting regular preventive care can help to keep you healthy and well. Preventive care includes getting regular testing and making lifestyle changes as recommended by your health care provider. Talk with your health care provider about:  Which screenings and tests you should have. A screening is a test that checks for a disease when you have no symptoms.  A diet and exercise plan that is right for you. What should I know about screenings and tests to prevent falls? Screening and testing are the best ways to find a health problem early. Early diagnosis and treatment give you the best chance of managing medical conditions that are common after age 57. Certain conditions and lifestyle choices may make you more likely to have a fall. Your health care provider may recommend:  Regular vision checks. Poor vision and conditions such as cataracts can make you more likely to have a fall. If you wear glasses, make sure to get your prescription updated if your vision changes.  Medicine review. Work with your health care provider to regularly review all of the medicines you are taking, including over-the-counter medicines. Ask your health care provider about any side effects that may make you more likely to have a fall. Tell your health care provider if any medicines that you take make you feel dizzy or sleepy.  Osteoporosis screening. Osteoporosis is a condition that causes the bones to get weaker. This can make the bones weak and cause them to break more easily.  Blood pressure screening. Blood pressure changes and medicines to control blood  pressure can make you feel dizzy.  Strength and balance checks. Your health care provider may recommend certain tests to check your strength and balance while standing, walking, or changing positions.  Foot health exam. Foot pain and numbness, as well as not wearing proper footwear, can make you more likely to have a fall.  Depression screening. You may be more likely to have a fall if you have a fear of falling, feel emotionally low, or feel unable to do activities that you used to do.  Alcohol use screening. Using too much alcohol can affect your balance and may make you more likely to have a fall. What actions can I take to lower my risk of falls? General instructions  Talk with your health care provider about your risks for falling. Tell your health care provider if: ? You fall. Be sure to tell your health care provider about all falls, even ones that seem minor. ? You feel dizzy, sleepy, or off-balance.  Take over-the-counter and prescription medicines only as told by your health care provider. These include any supplements.  Eat a healthy diet and maintain a healthy weight. A healthy diet includes low-fat dairy products, low-fat (lean) meats, and fiber from whole grains, beans, and lots of fruits and vegetables. Home safety  Remove any tripping hazards, such as rugs, cords, and clutter.  Install safety equipment such as grab bars in bathrooms and safety rails on stairs.  Keep rooms and walkways well-lit. Activity   Follow a regular exercise program to stay fit. This  will help you maintain your balance. Ask your health care provider what types of exercise are appropriate for you.  If you need a cane or walker, use it as recommended by your health care provider.  Wear supportive shoes that have nonskid soles. Lifestyle  Do not drink alcohol if your health care provider tells you not to drink.  If you drink alcohol, limit how much you have: ? 0-1 drink a day for  women. ? 0-2 drinks a day for men.  Be aware of how much alcohol is in your drink. In the [place] one drink equals one typical bottle of beer (12 oz), one-half glass of wine (5 oz), or one shot of hard liquor (1 oz).  Do not use any products that contain nicotine or tobacco, such as cigarettes and e-cigarettes. If you need help quitting, ask your health care provider. Summary  Having a healthy lifestyle and getting preventive care can help to protect your health and wellness after age 53.  Screening and testing are the best way to find a health problem early and help you avoid having a fall. Early diagnosis and treatment give you the best chance for managing medical conditions that are more common for people who are older than age 53.  Falls are a major cause of broken bones and head injuries in people who are older than age 62. Take precautions to prevent a fall at home.  Work with your health care provider to learn what changes you can make to improve your health and wellness and to prevent falls. This information is not intended to replace advice given to you by your health care provider. Make sure you discuss any questions you have with your health care provider. Document Released: 08/09/2017 Document Revised: 01/17/2019 Document Reviewed: 08/09/2017 Elsevier Patient Education  2020 [employer].

## 2019-05-04 ENCOUNTER — Encounter: Payer: Self-pay | Admitting: Family Medicine

## 2019-05-04 DIAGNOSIS — R7401 Elevation of levels of liver transaminase levels: Secondary | ICD-10-CM | POA: Insufficient documentation

## 2019-05-04 NOTE — Assessment & Plan Note (Signed)
New, of unclear cause. Denies abd pain. Will recheck in 2 months, consider abd [place] if persists.

## 2019-05-04 NOTE — Assessment & Plan Note (Addendum)
Preventative protocols reviewed and updated unless pt declined. Discussed healthy diet and lifestyle.  

## 2019-05-04 NOTE — Assessment & Plan Note (Signed)
Appreciate uro care - on tovia and daily macrobid.

## 2019-05-04 NOTE — Assessment & Plan Note (Signed)
Chronic, stable on lovastatin.  The 10-year ASCVD risk score Mikey Bussing DC Brooke Bonito., et al., 2013) is: 19.4%   Values used to calculate the score:     Age: 73 years     Sex: Female     Is Non-Hispanic African American: No     Diabetic: No     Tobacco smoker: No     Systolic Blood Pressure: 468 mmHg     Is BP treated: Yes     HDL Cholesterol: 56.6 mg/dL     Total Cholesterol: 135 mg/dL

## 2019-05-04 NOTE — Assessment & Plan Note (Signed)
Reviewed ongoing weight loss (80lbs total) - she continues participating in [employer]. Recommend against further weight loss - she desires to maintain at current weight. Will let me know if ongoing weight loss.

## 2019-05-04 NOTE — Assessment & Plan Note (Signed)

## 2019-05-04 NOTE — Assessment & Plan Note (Signed)
Kidney function has returned to normal. Will continue to monitor.

## 2019-05-04 NOTE — Assessment & Plan Note (Signed)
Stable period without recurrence. Rpt colonscopy planned 2022.

## 2019-05-04 NOTE — Assessment & Plan Note (Addendum)
Stable period off medication besides lasix.

## 2019-05-04 NOTE — Assessment & Plan Note (Signed)
Stable on rpt dexa 2019

## 2019-05-04 NOTE — Assessment & Plan Note (Signed)
Planning myelogram next week ([facility])

## 2019-05-04 NOTE — Assessment & Plan Note (Signed)
Stable period off medication.  

## 2019-05-08 ENCOUNTER — Ambulatory Visit (HOSPITAL_COMMUNITY)
Admission: RE | Admit: 2019-05-08 | Discharge: 2019-05-08 | Disposition: A | Discharge status: Home / Self Care | Payer: Medicare Other | Source: Ambulatory Visit | Attending: Neurological Surgery | Admitting: Neurological Surgery

## 2019-05-08 ENCOUNTER — Other Ambulatory Visit: Payer: Self-pay

## 2019-05-08 DIAGNOSIS — M5124 Other intervertebral disc displacement, thoracic region: Secondary | ICD-10-CM | POA: Insufficient documentation

## 2019-05-08 DIAGNOSIS — M21371 Foot drop, right foot: Secondary | ICD-10-CM | POA: Diagnosis not present

## 2019-05-08 DIAGNOSIS — M48061 Spinal stenosis, lumbar region without neurogenic claudication: Secondary | ICD-10-CM | POA: Diagnosis not present

## 2019-05-08 DIAGNOSIS — M4712 Other spondylosis with myelopathy, cervical region: Secondary | ICD-10-CM | POA: Diagnosis present

## 2019-05-08 DIAGNOSIS — M2578 Osteophyte, vertebrae: Secondary | ICD-10-CM | POA: Diagnosis not present

## 2019-05-08 DIAGNOSIS — M21372 Foot drop, left foot: Secondary | ICD-10-CM | POA: Insufficient documentation

## 2019-05-08 MED ORDER — HYDROCODONE-ACETAMINOPHEN 5-325 MG PO TABS: 1.0000 | ORAL_TABLET | ORAL | Status: DC | PRN

## 2019-05-08 MED ORDER — DIAZEPAM 5 MG PO TABS
10.0000 mg | ORAL_TABLET | Freq: Once | ORAL | Status: AC
  Administered 2019-05-08: 10 mg via ORAL
  Filled 2019-05-08: qty 2

## 2019-05-08 MED ORDER — DIAZEPAM 5 MG PO TABS
ORAL_TABLET | ORAL | Status: AC
  Administered 2019-05-08: 10 mg via ORAL
  Filled 2019-05-08: qty 2

## 2019-05-08 MED ORDER — LIDOCAINE HCL (PF) 1 % IJ SOLN
5.0000 mL | Freq: Once | INTRAMUSCULAR | Status: AC
  Administered 2019-05-08: 5 mL via INTRADERMAL

## 2019-05-08 MED ORDER — ONDANSETRON HCL 4 MG/2ML IJ SOLN: 4.0000 mg | Freq: Four times a day (QID) | INTRAMUSCULAR | Status: DC | PRN

## 2019-05-08 MED ORDER — IOHEXOL 300 MG/ML  SOLN
10.0000 mL | Freq: Once | INTRAMUSCULAR | Status: AC | PRN
  Administered 2019-05-08: 09:00:00 8 mL via INTRATHECAL

## 2019-05-08 NOTE — Discharge Instructions (Signed)
Myelogram and Lumbar Puncture Discharge Instructions  1. Go home and rest quietly for the next 24 hours.  It is important to lie flat for the next 24 hours.  Get up only to go to the restroom.  You may lie in the bed or on a couch on your back, your stomach, your left side or your right side.  You may have one pillow under your head.  You may have pillows between your knees while you are on your side or under your knees while you are on your back.  2. DO NOT drive today.  Recline the seat as far back as it will go, while still wearing your seat belt, on the way home.  3. You may get up to go to the bathroom as needed.  You may sit up for 10 minutes to eat.  You may resume your normal diet and medications unless otherwise indicated.  The incidence of headache, nausea, or vomiting is about 5% (one in 20 patients).  If you develop a headache, lie flat and drink plenty of fluids until the headache goes away.  Caffeinated beverages may be helpful.  If you develop severe nausea and vomiting or a headache that does not go away with flat bed rest, call [phone] 4. You may resume normal activities after your 24 hours of bed rest is over; however, do not exert yourself strongly or do any heavy lifting tomorrow.  5. Call your physician for a follow-up appointment.  The results of your myelogram will be sent directly to your physician by the following day.  6. If you have any questions or if complications develop after you arrive home, please call [phone].  Discharge instructions have been explained to the patient.  The patient, or the person responsible for the patient, fully understands these instructions.

## 2019-05-08 NOTE — Procedures (Signed)
[name] is a 73 year old individual whose had significant difficulty with her back and her legs she has had previous surgical decompression multiple levels for spondylitic stenosis she was found to have increasing back pain leg weakness and unstable gait and also complained of weakness in her upper extremities and she has some symptoms of myelopathy but no gross hyperreflexia because of the concerns of her deteriorating situation I advised that we do myelography to look at not only the lumbar region but also the thoracic and cervical region this is being performed because she has an implanted pacemaker that is not MRI compatible..  Pre op Dx: Advanced spondylosis with cervical myelopathy Post op Dx: Same Procedure: Total myelogram Surgeon: Ellene Route Puncture level: L3-4 Fluid color: Clear colorless Injection: Isovue-300, 9 mL Findings: Advanced spondylosis throughout the cervical lumbar and thoracic spine without good filling of the thecal sac on myelography.  Further evaluation with CT scanning.

## 2019-05-10 DIAGNOSIS — R269 Unspecified abnormalities of gait and mobility: Secondary | ICD-10-CM | POA: Insufficient documentation

## 2019-05-13 NOTE — Progress Notes (Signed)
Remote pacemaker transmission.   

## 2019-05-14 ENCOUNTER — Encounter: Payer: Self-pay | Admitting: Family Medicine

## 2019-05-14 ENCOUNTER — Other Ambulatory Visit: Payer: Self-pay

## 2019-05-14 ENCOUNTER — Ambulatory Visit (HOSPITAL_COMMUNITY): Payer: Medicare Other | Attending: Cardiovascular Disease

## 2019-05-14 DIAGNOSIS — M4810 Ankylosing hyperostosis [Forestier], site unspecified: Secondary | ICD-10-CM | POA: Insufficient documentation

## 2019-05-14 DIAGNOSIS — Z952 Presence of prosthetic heart valve: Secondary | ICD-10-CM | POA: Diagnosis present

## 2019-05-14 HISTORY — DX: Ankylosing hyperostosis (forestier), site unspecified: M48.10

## 2019-05-15 ENCOUNTER — Telehealth: Payer: Self-pay | Admitting: *Deleted

## 2019-05-15 ENCOUNTER — Encounter: Payer: Self-pay | Admitting: Cardiovascular Disease

## 2019-05-15 ENCOUNTER — Telehealth (INDEPENDENT_AMBULATORY_CARE_PROVIDER_SITE_OTHER): Payer: Medicare Other | Admitting: Cardiovascular Disease

## 2019-05-15 VITALS — HR 70 | Ht 66.0 in | Wt 137.0 lb

## 2019-05-15 DIAGNOSIS — I4892 Unspecified atrial flutter: Secondary | ICD-10-CM | POA: Insufficient documentation

## 2019-05-15 DIAGNOSIS — T82857A Stenosis of cardiac prosthetic devices, implants and grafts, initial encounter: Secondary | ICD-10-CM | POA: Insufficient documentation

## 2019-05-15 DIAGNOSIS — I483 Typical atrial flutter: Secondary | ICD-10-CM

## 2019-05-15 DIAGNOSIS — I441 Atrioventricular block, second degree: Secondary | ICD-10-CM | POA: Diagnosis not present

## 2019-05-15 DIAGNOSIS — I484 Atypical atrial flutter: Secondary | ICD-10-CM | POA: Diagnosis not present

## 2019-05-15 DIAGNOSIS — I10 Essential (primary) hypertension: Secondary | ICD-10-CM

## 2019-05-15 DIAGNOSIS — I5032 Chronic diastolic (congestive) heart failure: Secondary | ICD-10-CM | POA: Diagnosis not present

## 2019-05-15 DIAGNOSIS — E785 Hyperlipidemia, unspecified: Secondary | ICD-10-CM

## 2019-05-15 DIAGNOSIS — J449 Chronic obstructive pulmonary disease, unspecified: Secondary | ICD-10-CM

## 2019-05-15 DIAGNOSIS — Z952 Presence of prosthetic heart valve: Secondary | ICD-10-CM

## 2019-05-15 DIAGNOSIS — Z01818 Encounter for other preprocedural examination: Secondary | ICD-10-CM

## 2019-05-15 HISTORY — DX: Stenosis of other cardiac prosthetic devices, implants and grafts, initial encounter: T82.857A

## 2019-05-15 HISTORY — DX: Typical atrial flutter: I48.3

## 2019-05-15 NOTE — Telephone Encounter (Signed)
Patient notified of results. She asked about visitor policy during procedure. Explained this can change based on COVID19 in the community so I will have [name] touch base with her prior to 8/25 with update on this

## 2019-05-15 NOTE — Patient Instructions (Addendum)
Medication Instructions:  Your physician recommends that you continue on your current medications as directed. Please refer to the Current Medication list given to you today.  If you need a refill on your cardiac medications before your next appointment, please call your pharmacy.   Testing/Procedures: Your physician has requested that you have a TEE. During a TEE, sound waves are used to create images of your heart. It provides your doctor with information about the size and shape of your heart and how well your heart's chambers and valves are working. In this test, a transducer is attached to the end of a flexible tube that's guided down your throat and into your esophagus (the tube leading from you mouth to your stomach) to get a more detailed image of your heart. You are not awake for the procedure. Please see the instruction sheet given to you today. For further information please visit [email or web address].  Follow-Up: Follow up with Dr. Sallyanne Kuster on 06/18/2019 at 3:20 (virtual visit)  Any Other Special Instructions Will Be Listed Below (If Applicable).  You are scheduled for a TEE on 06/04/2019 with Dr. Sallyanne Kuster.  Please arrive at the [facility] (Main Entrance A) at [facility]: [place], [place] [address] at 2 pm (1 hour prior to procedure)  DIET: Nothing to eat or drink after midnight except a sip of water with medications (see medication instructions below)  Medication Instructions: Continue your anticoagulant: Xarelto You will need to continue your anticoagulant after your procedure until you are told by your provider that it is safe to stop   Labs: Your provider would like for you to return on Friday 8/21  to have the following labs drawn: BMET and CBC. You do not need an appointment for the lab. Once in our office lobby there is a podium where you can sign in and ring the doorbell to alert [place] that you are here. The lab is open from 8:00 am to 4:30 pm; closed for  lunch from 12:45pm-1:45pm.  You will need to have the coronavirus test completed prior to your procedure. An appointment has been made at 11:40 on 05/31/2019. This is a Drive Up Visit at the [facility] [address]. Someone will direct you to the appropriate testing line. Stay in your car and someone will be with you shortly. Please make sure to have all other labs completed before this test because you will need to stay quarantined until your procedure.   You must have a responsible person to drive you home and stay in the waiting area during your procedure. Failure to do so could result in cancellation.  Bring your insurance cards.  *Special Note: Every effort is made to have your procedure done on time. Occasionally there are emergencies that occur at the hospital that may cause delays. Please be patient if a delay does occur.

## 2019-05-15 NOTE — Telephone Encounter (Addendum)
Left a message for the patient to call back to go over the TEE instructions.   You are scheduled for a TEE on 06/04/2019 with Dr. Sallyanne Kuster.  Please arrive at the [facility] (Main Entrance A) at [facility]: [address] [place], [place] [address] at 2 pm (1 hour prior to procedure)  Your provider would like for you to return on Friday 8/21  to have the following labs drawn: BMET and CBC. You do not need an appointment for the lab. Once in our office lobby there is a podium where you can sign in and ring the doorbell to alert [place] that you are here. The lab is open from 8:00 am to 4:30 pm; closed for lunch from 12:45pm-1:45pm.  You will need to have the coronavirus test completed prior to your procedure. An appointment has been made at 11:40 on 05/31/2019. This is a Drive Up Visit at the [facility] [address] Someone will direct you to the appropriate testing line. Stay in your car and someone will be with you shortly. Please make sure to have all other labs completed before this test because you will need to stay quarantined until your procedure.

## 2019-05-15 NOTE — H&P (View-Only) (Signed)
Virtual Visit via Telephone Note   This visit type was conducted due to national recommendations for restrictions regarding the COVID-19 Pandemic (e.g. social distancing) in an effort to limit this patient's exposure and mitigate transmission in our community.  Due to her co-morbid illnesses, this patient is at least at moderate risk for complications without adequate follow up.  This format is felt to be most appropriate for this patient at this time.  The patient did not have access to video technology/had technical difficulties with video requiring transitioning to audio format only (telephone).  All issues noted in this document were discussed and addressed.  No physical exam could be performed with this format.  Please refer to the patient's chart for her  consent to telehealth for [facility].   Date:  05/15/2019   ID:  Zanya Lindo, [place] [birth year], MRN [id number]  Patient Location: Home Provider Location: Home  PCP:  Ria Bush, MD  Cardiologist:  Sanda Klein, MD  Electrophysiologist:  Constance Haw, MD   Evaluation Performed:  Follow-Up Visit  Chief Complaint: Abnormal findings on echocardiogram  History of Present Illness:    [name] is a 73 y.o. 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, [place], 2017), (typical?) atrial flutter (overdrive pacing March 1941).  She has chronic exertional dyspnea, that has not noticeably changed.  However she has been more sedentary recently, secondary to degenerative cervical and lumbar spine disease.  She had a CT myelogram on July 29.  There was severe left foraminal stenosis at L3-4 as well as moderate left foraminal stenosis at C4-5-6.  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  Denies angina, syncope,  palpitations, dizziness, edema or bleeding problems.  She is on chronic anticoagulation with Xarelto.  Her transthoracic echocardiogram performed just yesterday showed marked increase in the gradients across the mitral valve prosthesis, which has gone from a mean gradient of 50mmHg to 12 mmHg.  The gradients across the aortic valve prosthesis have not changed much suggesting that the increase in mitral gradients is not due to a change in cardiac output.  There was no meaningful mitral regurgitation.  The pressure half-time appears similar to the previous echo, but it is hard to measure accurately due to relative E-A fusion.  Left ventricular systolic function is normal with an ejection fraction of 55-60% and there is only mild LVH.  Left atrium severely dilated.  Her recent pacemaker download from 05/02/2019 shows normal device function with approximately 9 years of battery longevity, 86% atrial pacing and 100% ventricular pacing, no episodes of high ventricular rates.  After being in persistent atrial fibrillation from July 2019 through February 2020, she has had very few and very brief episodes of atrial mode switch since then, overall arrhythmia burden well under 1%.  The patient does not have symptoms concerning for COVID-19 infection (fever, chills, cough, or new shortness of breath).    Past Medical History:  Diagnosis Date  . Acute cystitis without hematuria 12/22/2015  . Anemia   . Arthritis   . CAP (community acquired pneumonia) 02/08/2016  . CAP (community acquired pneumonia) 02/08/2016  . Cataracts, bilateral    immature  . Chronic cholecystitis with calculus 01/21/2015   S/p cholecystectomy   . Chronic diastolic heart failure ([place]) 09/19/2014  . Chronic insomnia   . Constipation  takes Miralax daily as needed  . COPD (chronic obstructive pulmonary disease) (HCC)    Albuterol inhaler daily as needed;Duoneb daily as needed;Spiriva daily  . DDD (degenerative disc disease)    cervical -  kyphosis with mod DD changes C5/6 and C6/7; lumbar - early DD at L2/3 ([place])  . Dyspnea    with exertion  . Essential hypertension    was on meds but after appointment Dec 11 with Dr.C he took her off  . GERD (gastroesophageal reflux disease)    takes Protonix daily  . History of pneumonia 02/16/2016  . History of radiation therapy 05/30/11 to 07/07/11   rectum  . History of shingles   . Hyperlipemia    takes Lovastatin daily  . Legally blind in left eye, as defined in [place]   . Lung nodule    RLL nodule-38mm stable 2006, April 2009, and June 2009  . Obesity   . Osteopenia 12/2014   T -1.5 hip  . Pancreatitis 11/2014   ?zpack related vs gallstone pancreatitis with abnormal HIDA scan pending cholecystectomy  . PONV (postoperative nausea and vomiting)   . Presence of permanent cardiac pacemaker   . Rectal cancer ([place]) 04/2011   T3N0; s/p lap LAR, s/p ileostomy, s/p reversal, s/p chemo  . Research study patient 08/26/2016  . Rheumatic heart disease mitral stenosis    mod MS by echo 02/2014  . S/P AVR (aortic valve replacement) 2015   bioprosthetic ([place])  . S/P MVR (mitral valve replacement) 2015   bioprosthetic ([place])  . Seizures ([place])    febrile seizures as a child  . Sepsis secondary to UTI ([place]) 08/22/2017  . Severe aortic valve stenosis  AVR 3/15 10/01/2013   mild-mod by echo 02/2014  . Sleep apnea    PT TOLD BORDERLINE-TRIED CPAP-DID NOT HELP-SHE DOES NOT USE CPAP  . Small bowel obstruction, partial ([place]) 08/2013   reolved without NGT placement.   . Vitamin B12 deficiency 02/28/2015   Start B12 shots 02/2015    Past Surgical History:  Procedure Laterality Date  . ANTERIOR LAT LUMBAR FUSION Left 07/17/2018   Procedure: Lumbar Two-Three Lumbar Three-Four Anterolateral decompression/interbody fusion with lateral plate fixation/Infuse;  Surgeon: Kristeen Miss, MD;  Location: [facility];  Service: Neurosurgery;  Laterality: Left;  Lumbar Two-Three Lumbar Three-Four Anterolateral  decompression/interbody fusion with lateral plate fixation/Infuse  . AORTIC VALVE REPLACEMENT N/A 12/12/2013   Procedure: AORTIC VALVE REPLACEMENT (AVR);  Surgeon: Gaye Pollack, MD; Service: Open Heart Surgery  . BACK SURGERY  2006, 2007   2006 SPACER, 2007 decompression and fusion L4/5  . BOWEL RESECTION  04/02/2012   Procedure: SMALL BOWEL RESECTION;  Surgeon: Stark Klein, MD;  Location: [facility];  Service: General;  Laterality: N/A;  . CARPAL TUNNEL RELEASE Bilateral   . CATARACT EXTRACTION W/ INTRAOCULAR LENS IMPLANT Left 10/18/2016  . CATARACT EXTRACTION W/ INTRAOCULAR LENS IMPLANT Right 12/13/2016   Dr. Kathrin Penner  . CHOLECYSTECTOMY N/A 01/21/2015   chronic cholecystitis, Stark Klein, MD  . COLON RESECTION  08/25/2011   Procedure: COLON RESECTION LAPAROSCOPIC;  Surgeon: Stark Klein, MD;  Location: [facility];  Service: General;  Laterality: N/A;  Laparoscopic Assisted Low Anterior Resection Diverting Ostomy and onQ pain pump  . COLONOSCOPY  03/2013   1 polyp, rpt 3 yrs Ardis Hughs)  . COLONOSCOPY WITH PROPOFOL N/A 06/09/2016   patent colo-colonic anastomosis, rpt 5 yrs Ardis Hughs)  . EP IMPLANTABLE DEVICE N/A 06/16/2016   Procedure: Pacemaker Implant;  Surgeon: Will Meredith Leeds, MD;  Location: [facility];  Service: Cardiovascular;  Laterality: N/A;  . FOOT SURGERY  left foot   hammer toe  . HAND SURGERY  Bil   carpal tunnel  . ILEOSTOMY  08/25/2011  . ILEOSTOMY CLOSURE  04/02/2012   Procedure: ILEOSTOMY TAKEDOWN;  Surgeon: Stark Klein, MD;  Location: [facility];  Service: General;  Laterality: N/A;  . INTRAOPERATIVE TRANSESOPHAGEAL ECHOCARDIOGRAM N/A 12/12/2013   Procedure: INTRAOPERATIVE TRANSESOPHAGEAL ECHOCARDIOGRAM;  Surgeon: Gaye Pollack, MD;  Location: [facility];  Service: Open Heart Surgery;  Laterality: N/A;  . LEFT AND RIGHT HEART CATHETERIZATION WITH CORONARY ANGIOGRAM N/A 11/27/2013   Procedure: LEFT AND RIGHT HEART CATHETERIZATION WITH CORONARY ANGIOGRAM;  Surgeon: Sanda Klein,  MD;  Location: [facility] C[facility];  Service: Cardiovascular;  Laterality: N/A;  . MITRAL VALVE REPLACEMENT N/A 12/12/2013   Procedure: MITRAL VALVE (MV) REPLACEMENT OR REPAIR;  Surgeon: Gaye Pollack, MD; Service: Open Heart Surgery  . TEE WITHOUT CARDIOVERSION N/A 11/29/2013   Procedure: TRANSESOPHAGEAL ECHOCARDIOGRAM (TEE);  Surgeon: Sueanne Margarita, MD;  Location: [facility]OS[facility];  Service: Cardiovascular;  Laterality: N/A;  . TONSILLECTOMY       No outpatient medications have been marked as taking for the 05/15/19 encounter (Telemedicine) with Danni Shima, Dani Gobble, MD.     Allergies:   Codeine, Prednisone, Sulfa antibiotics, and Sulfonamide derivatives   Social History   Tobacco Use  . Smoking status: Former Smoker    Packs/day: 1.50    Years: 10.00    Pack years: 15.00    Types: Cigarettes    Quit date: 12/28/1974    Years since quitting: 44.4  . Smokeless tobacco: Never Used  . Tobacco comment: quit smoking in 1976  Substance Use Topics  . Alcohol use: Not Currently    Alcohol/week: 2.0 standard drinks    Types: 2 Glasses of wine per week    Comment: very rare  . Drug use: No     Family Hx: The patient's family history includes Breast cancer (age of onset: 34) in her maternal aunt; CAD (age of onset: 30) in her son; Diabetes in her mother; Heart attack in her maternal grandfather; Heart attack (age of onset: 75) in her son; Heart disease in her mother; Hypertension in her mother; Lung cancer in her father; Stroke in her mother. There is no history of Colon cancer, Esophageal cancer, Stomach cancer, or Rectal cancer.  ROS:   Please see the history of present illness.     All other systems reviewed and are negative.   Prior CV studies:   The following studies were reviewed today:  Echocardiogram May 14, 2019  Labs/Other Tests and Data Reviewed:    EKG:  An ECG dated 04/16/2019 was personally reviewed today and demonstrated:  Atrioventricular sequential pacing with positive R waves  in lead V1  Recent Labs: 11/23/2018: Hemoglobin 13.6; Platelets 145 04/23/2019: ALT 36; BUN 19; Creatinine, Ser 0.71; Potassium 3.9; Sodium 140; TSH 2.24   Recent Lipid Panel Lab Results  Component Value Date/Time   CHOL 135 04/23/2019 07:48 AM   TRIG 47.0 04/23/2019 07:48 AM   HDL 56.60 04/23/2019 07:48 AM   CHOLHDL 2 04/23/2019 07:48 AM   LDLCALC 69 04/23/2019 07:48 AM   LDLDIRECT 111.0 06/26/2015 09:28 AM   CT chest from 04/22/2019 shows "waxing/waning peribronchovascular nodularity in the lungs bilaterally, suggesting a combination of postinfectious inflammatory scarring and atypical mycobacterial infection rather than metastatic disease".  There was evidence of three-vessel coronary and aortic atherosclerosis.  Tiny right pleural  effusion, unchanged. On my review, highly suspicious that the tip of the right ventricular pacing lead is in the pericardial space.  This would explain the positive R waves in leads V1.   Wt Readings from Last 3 Encounters:  05/15/19 137 lb (62.1 kg)  05/08/19 137 lb (62.1 kg)  05/02/19 138 lb 6.4 oz (62.8 kg)     Objective:    Vital Signs:  Pulse 70   Ht 5\' 6"  (1.676 m)   Wt 137 lb (62.1 kg)   BMI 22.11 kg/m   She was unable to check blood pressure.  Heart rate is 70 based on echocardiogram yesterday.  VITAL SIGNS:  reviewed Unable to examine  ASSESSMENT & PLAN:    1. CHF: Functional assessment limited by problems with her spine and not able to assess volume status with telephone visit, but no overt evidence of hypervolemia.  Normal LVEF. 2.  Prosthetic MV stenosis: Previous across the mitral valve prosthesis are substantially higher.  This cannot be explained by increased cardiac output since the aortic valve gradients appear unchanged.  She is not tachycardic.  It is possible that she has early deterioration of the biological prosthesis or that she may have prosthetic valve thrombosis (she is on Xarelto but does have a history of rheumatic  mitral stenosis and may do better with warfarin). Recommend TEE assessment. This procedure has been fully reviewed with the patient and informed consent has been obtained. 3. S/p AVR: Normally functioning aortic valve biological prosthesis. 4. 2nd deg AV block: Has 100% ventricular pacing despite VIP programming.  Preserved LVEF, no overt evidence of problems related to dyssynchrony. 5. Atrial flutter: Had successful overdrive pacing of persistent atrial flutter in March 2020.  Maintaining sinus rhythm well for a long period of time now.  History of syncope on higher doses of bisoprolol related to orthostatic hypotension.  Tolerating metoprolol well.  On anticoagulation.  Not sure that Xarelto is appropriate since she has a history of rheumatic heart disease and a mitral valve prosthesis in place.  Very reluctant to switch to warfarin in the current coronavirus pandemic situation.  If we identify prosthetic valve thrombosis at TEE, warfarin will probably be a better choice than Xarelto. 6.  COPD: Limited access to pulmonary rehab with the coronavirus pandemic.  Overall she has done very well.  Note findings on recent CT of the chest. 7.  HLP: Recent lipid profile with all parameters in target range.  She's done an exceptionally good job with weight loss.  COVID-19 Education: The signs and symptoms of COVID-19 were discussed with the patient and how to seek care for testing (follow up with PCP or arrange E-visit).  The importance of social distancing was discussed today.  Time:   Today, I have spent 20 minutes with the patient with telehealth technology discussing the above problems.     Medication Adjustments/Labs and Tests Ordered: Current medicines are reviewed at length with the patient today.  Concerns regarding medicines are outlined above.   Tests Ordered: Orders Placed This Encounter  Procedures  . Basic metabolic panel  . CBC    Medication Changes: No orders of the defined types were  placed in this encounter.  Patient Instructions  Medication Instructions:  Your physician recommends that you continue on your current medications as directed. Please refer to the Current Medication list given to you today.  If you need a refill on your cardiac medications before your next appointment, please call your pharmacy.   Testing/Procedures: Your  physician has requested that you have a TEE. During a TEE, sound waves are used to create images of your heart. It provides your doctor with information about the size and shape of your heart and how well your heart's chambers and valves are working. In this test, a transducer is attached to the end of a flexible tube that's guided down your throat and into your esophagus (the tube leading from you mouth to your stomach) to get a more detailed image of your heart. You are not awake for the procedure. Please see the instruction sheet given to you today. For further information please visit [email or web address].  Follow-Up: Follow up with Dr. Sallyanne Kuster on 06/18/2019 at 3:20 (virtual visit)  Any Other Special Instructions Will Be Listed Below (If Applicable).  You are scheduled for a TEE on 06/04/2019 with Dr. Sallyanne Kuster.  Please arrive at the [facility] (Main Entrance A) at [facility]: [place], [place] [address] at 2 pm (1 hour prior to procedure)  DIET: Nothing to eat or drink after midnight except a sip of water with medications (see medication instructions below)  Medication Instructions: Continue your anticoagulant: Xarelto You will need to continue your anticoagulant after your procedure until you are told by your provider that it is safe to stop   Labs: Your provider would like for you to return on Friday 8/21  to have the following labs drawn: BMET and CBC. You do not need an appointment for the lab. Once in our office lobby there is a podium where you can sign in and ring the doorbell to alert [place] that you are here.  The lab is open from 8:00 am to 4:30 pm; closed for lunch from 12:45pm-1:45pm.  You will need to have the coronavirus test completed prior to your procedure. An appointment has been made at 11:40 on 05/31/2019. This is a Drive Up Visit at the [facility] [address]. Someone will direct you to the appropriate testing line. Stay in your car and someone will be with you shortly. Please make sure to have all other labs completed before this test because you will need to stay quarantined until your procedure.   You must have a responsible person to drive you home and stay in the waiting area during your procedure. Failure to do so could result in cancellation.  Bring your insurance cards.  *Special Note: Every effort is made to have your procedure done on time. Occasionally there are emergencies that occur at the hospital that may cause delays. Please be patient if a delay does occur.          Follow Up:  Virtual Visit 2 weeks after TEE  Signed, Sanda Klein, MD  05/15/2019 10:39 AM    [place]

## 2019-05-15 NOTE — Telephone Encounter (Signed)
New Message    Pt is returning call  She said she will be home until 1230 and then will return back at 3

## 2019-05-15 NOTE — Telephone Encounter (Signed)

## 2019-05-15 NOTE — Progress Notes (Signed)
Virtual Visit via Telephone Note   This visit type was conducted due to national recommendations for restrictions regarding the COVID-19 Pandemic (e.g. social distancing) in an effort to limit this patient's exposure and mitigate transmission in our community.  Due to her co-morbid illnesses, this patient is at least at moderate risk for complications without adequate follow up.  This format is felt to be most appropriate for this patient at this time.  The patient did not have access to video technology/had technical difficulties with video requiring transitioning to audio format only (telephone).  All issues noted in this document were discussed and addressed.  No physical exam could be performed with this format.  Please refer to the patient's chart for her  consent to telehealth for [facility].   Date:  05/15/2019   ID:  [name], [place] Oct 16, 1945, MRN [id number]  Patient Location: Home Provider Location: Home  PCP:  Ria Bush, MD  Cardiologist:  Sanda Klein, MD  Electrophysiologist:  Constance Haw, MD   Evaluation Performed:  Follow-Up Visit  Chief Complaint: Abnormal findings on echocardiogram  History of Present Illness:    [name] is a 73 y.o. 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, [place], 2017), (typical?) atrial flutter (overdrive pacing March 7026).  She has chronic exertional dyspnea, that has not noticeably changed.  However she has been more sedentary recently, secondary to degenerative cervical and lumbar spine disease.  She had a CT myelogram on July 29.  There was severe left foraminal stenosis at L3-4 as well as moderate left foraminal stenosis at C4-5-6.  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  Denies angina, syncope,  palpitations, dizziness, edema or bleeding problems.  She is on chronic anticoagulation with Xarelto.  Her transthoracic echocardiogram performed just yesterday showed marked increase in the gradients across the mitral valve prosthesis, which has gone from a mean gradient of 64mmHg to 12 mmHg.  The gradients across the aortic valve prosthesis have not changed much suggesting that the increase in mitral gradients is not due to a change in cardiac output.  There was no meaningful mitral regurgitation.  The pressure half-time appears similar to the previous echo, but it is hard to measure accurately due to relative E-A fusion.  Left ventricular systolic function is normal with an ejection fraction of 55-60% and there is only mild LVH.  Left atrium severely dilated.  Her recent pacemaker download from 05/02/2019 shows normal device function with approximately 9 years of battery longevity, 86% atrial pacing and 100% ventricular pacing, no episodes of high ventricular rates.  After being in persistent atrial fibrillation from July 2019 through February 2020, she has had very few and very brief episodes of atrial mode switch since then, overall arrhythmia burden well under 1%.  The patient does not have symptoms concerning for COVID-19 infection (fever, chills, cough, or new shortness of breath).    Past Medical History:  Diagnosis Date  . Acute cystitis without hematuria 12/22/2015  . Anemia   . Arthritis   . CAP (community acquired pneumonia) 02/08/2016  . CAP (community acquired pneumonia) 02/08/2016  . Cataracts, bilateral    immature  . Chronic cholecystitis with calculus 01/21/2015   S/p cholecystectomy   . Chronic diastolic heart failure ([place]) 09/19/2014  . Chronic insomnia   . Constipation  takes Miralax daily as needed  . COPD (chronic obstructive pulmonary disease) (HCC)    Albuterol inhaler daily as needed;Duoneb daily as needed;Spiriva daily  . DDD (degenerative disc disease)    cervical -  kyphosis with mod DD changes C5/6 and C6/7; lumbar - early DD at L2/3 ([place])  . Dyspnea    with exertion  . Essential hypertension    was on meds but after appointment Dec 11 with Dr.C he took her off  . GERD (gastroesophageal reflux disease)    takes Protonix daily  . History of pneumonia 02/16/2016  . History of radiation therapy 05/30/11 to 07/07/11   rectum  . History of shingles   . Hyperlipemia    takes Lovastatin daily  . Legally blind in left eye, as defined in [place]   . Lung nodule    RLL nodule-14mm stable 2006, April 2009, and June 2009  . Obesity   . Osteopenia 12/2014   T -1.5 hip  . Pancreatitis 11/2014   ?zpack related vs gallstone pancreatitis with abnormal HIDA scan pending cholecystectomy  . PONV (postoperative nausea and vomiting)   . Presence of permanent cardiac pacemaker   . Rectal cancer ([place]) 04/2011   T3N0; s/p lap LAR, s/p ileostomy, s/p reversal, s/p chemo  . Research study patient 08/26/2016  . Rheumatic heart disease mitral stenosis    mod MS by echo 02/2014  . S/P AVR (aortic valve replacement) 2015   bioprosthetic ([place])  . S/P MVR (mitral valve replacement) 2015   bioprosthetic ([place])  . Seizures ([place])    febrile seizures as a child  . Sepsis secondary to UTI ([place]) 08/22/2017  . Severe aortic valve stenosis  AVR 3/15 10/01/2013   mild-mod by echo 02/2014  . Sleep apnea    PT TOLD BORDERLINE-TRIED CPAP-DID NOT HELP-SHE DOES NOT USE CPAP  . Small bowel obstruction, partial ([place]) 08/2013   reolved without NGT placement.   . Vitamin B12 deficiency 02/28/2015   Start B12 shots 02/2015    Past Surgical History:  Procedure Laterality Date  . ANTERIOR LAT LUMBAR FUSION Left 07/17/2018   Procedure: Lumbar Two-Three Lumbar Three-Four Anterolateral decompression/interbody fusion with lateral plate fixation/Infuse;  Surgeon: Kristeen Miss, MD;  Location: [place];  Service: Neurosurgery;  Laterality: Left;  Lumbar Two-Three Lumbar Three-Four Anterolateral  decompression/interbody fusion with lateral plate fixation/Infuse  . AORTIC VALVE REPLACEMENT N/A 12/12/2013   Procedure: AORTIC VALVE REPLACEMENT (AVR);  Surgeon: Gaye Pollack, MD; Service: Open Heart Surgery  . BACK SURGERY  2006, 2007   2006 SPACER, 2007 decompression and fusion L4/5  . BOWEL RESECTION  04/02/2012   Procedure: SMALL BOWEL RESECTION;  Surgeon: Stark Klein, MD;  Location: [facility];  Service: General;  Laterality: N/A;  . CARPAL TUNNEL RELEASE Bilateral   . CATARACT EXTRACTION W/ INTRAOCULAR LENS IMPLANT Left 10/18/2016  . CATARACT EXTRACTION W/ INTRAOCULAR LENS IMPLANT Right 12/13/2016   Dr. Kathrin Penner  . CHOLECYSTECTOMY N/A 01/21/2015   chronic cholecystitis, Stark Klein, MD  . COLON RESECTION  08/25/2011   Procedure: COLON RESECTION LAPAROSCOPIC;  Surgeon: Stark Klein, MD;  Location: [facility];  Service: General;  Laterality: N/A;  Laparoscopic Assisted Low Anterior Resection Diverting Ostomy and onQ pain pump  . COLONOSCOPY  03/2013   1 polyp, rpt 3 yrs Ardis Hughs)  . COLONOSCOPY WITH PROPOFOL N/A 06/09/2016   patent colo-colonic anastomosis, rpt 5 yrs Ardis Hughs)  . EP IMPLANTABLE DEVICE N/A 06/16/2016   Procedure: Pacemaker Implant;  Surgeon: Will Meredith Leeds, MD;  Location: [place] [facility];  Service: Cardiovascular;  Laterality: N/A;  . FOOT SURGERY  left foot   hammer toe  . HAND SURGERY  Bil   carpal tunnel  . ILEOSTOMY  08/25/2011  . ILEOSTOMY CLOSURE  04/02/2012   Procedure: ILEOSTOMY TAKEDOWN;  Surgeon: Stark Klein, MD;  Location: [facility];  Service: General;  Laterality: N/A;  . INTRAOPERATIVE TRANSESOPHAGEAL ECHOCARDIOGRAM N/A 12/12/2013   Procedure: INTRAOPERATIVE TRANSESOPHAGEAL ECHOCARDIOGRAM;  Surgeon: Gaye Pollack, MD;  Location: [place] [facility];  Service: Open Heart Surgery;  Laterality: N/A;  . LEFT AND RIGHT HEART CATHETERIZATION WITH CORONARY ANGIOGRAM N/A 11/27/2013   Procedure: LEFT AND RIGHT HEART CATHETERIZATION WITH CORONARY ANGIOGRAM;  Surgeon: Sanda Klein,  MD;  Location: [place] [facility];  Service: Cardiovascular;  Laterality: N/A;  . MITRAL VALVE REPLACEMENT N/A 12/12/2013   Procedure: MITRAL VALVE (MV) REPLACEMENT OR REPAIR;  Surgeon: Gaye Pollack, MD; Service: Open Heart Surgery  . TEE WITHOUT CARDIOVERSION N/A 11/29/2013   Procedure: TRANSESOPHAGEAL ECHOCARDIOGRAM (TEE);  Surgeon: Sueanne Margarita, MD;  Location: [facility] ENDOS[facility];  Service: Cardiovascular;  Laterality: N/A;  . TONSILLECTOMY       No outpatient medications have been marked as taking for the 05/15/19 encounter (Telemedicine) with Matt Delpizzo, Dani Gobble, MD.     Allergies:   Codeine, Prednisone, Sulfa antibiotics, and Sulfonamide derivatives   Social History   Tobacco Use  . Smoking status: Former Smoker    Packs/day: 1.50    Years: 10.00    Pack years: 15.00    Types: Cigarettes    Quit date: 12/28/1974    Years since quitting: 44.4  . Smokeless tobacco: Never Used  . Tobacco comment: quit smoking in 1976  Substance Use Topics  . Alcohol use: Not Currently    Alcohol/week: 2.0 standard drinks    Types: 2 Glasses of wine per week    Comment: very rare  . Drug use: No     Family Hx: The patient's family history includes Breast cancer (age of onset: 51) in her maternal aunt; CAD (age of onset: 10) in her son; Diabetes in her mother; Heart attack in her maternal grandfather; Heart attack (age of onset: 24) in her son; Heart disease in her mother; Hypertension in her mother; Lung cancer in her father; Stroke in her mother. There is no history of Colon cancer, Esophageal cancer, Stomach cancer, or Rectal cancer.  ROS:   Please see the history of present illness.     All other systems reviewed and are negative.   Prior CV studies:   The following studies were reviewed today:  Echocardiogram May 14, 2019  Labs/Other Tests and Data Reviewed:    EKG:  An ECG dated 04/16/2019 was personally reviewed today and demonstrated:  Atrioventricular sequential pacing with positive R waves  in lead V1  Recent Labs: 11/23/2018: Hemoglobin 13.6; Platelets 145 04/23/2019: ALT 36; BUN 19; Creatinine, Ser 0.71; Potassium 3.9; Sodium 140; TSH 2.24   Recent Lipid Panel Lab Results  Component Value Date/Time   CHOL 135 04/23/2019 07:48 AM   TRIG 47.0 04/23/2019 07:48 AM   HDL 56.60 04/23/2019 07:48 AM   CHOLHDL 2 04/23/2019 07:48 AM   LDLCALC 69 04/23/2019 07:48 AM   LDLDIRECT 111.0 06/26/2015 09:28 AM   CT chest from 04/22/2019 shows "waxing/waning peribronchovascular nodularity in the lungs bilaterally, suggesting a combination of postinfectious inflammatory scarring and atypical mycobacterial infection rather than metastatic disease".  There was evidence of three-vessel coronary and aortic atherosclerosis.  Tiny right pleural  effusion, unchanged. On my review, highly suspicious that the tip of the right ventricular pacing lead is in the pericardial space.  This would explain the positive R waves in leads V1.   Wt Readings from Last 3 Encounters:  05/15/19 137 lb (62.1 kg)  05/08/19 137 lb (62.1 kg)  05/02/19 138 lb 6.4 oz (62.8 kg)     Objective:    Vital Signs:  Pulse 70   Ht 5\' 6"  (1.676 m)   Wt 137 lb (62.1 kg)   BMI 22.11 kg/m   She was unable to check blood pressure.  Heart rate is 70 based on echocardiogram yesterday.  VITAL SIGNS:  reviewed Unable to examine  ASSESSMENT & PLAN:    1. CHF: Functional assessment limited by problems with her spine and not able to assess volume status with telephone visit, but no overt evidence of hypervolemia.  Normal LVEF. 2.  Prosthetic MV stenosis: Previous across the mitral valve prosthesis are substantially higher.  This cannot be explained by increased cardiac output since the aortic valve gradients appear unchanged.  She is not tachycardic.  It is possible that she has early deterioration of the biological prosthesis or that she may have prosthetic valve thrombosis (she is on Xarelto but does have a history of rheumatic  mitral stenosis and may do better with warfarin). Recommend TEE assessment. This procedure has been fully reviewed with the patient and informed consent has been obtained. 3. S/p AVR: Normally functioning aortic valve biological prosthesis. 4. 2nd deg AV block: Has 100% ventricular pacing despite VIP programming.  Preserved LVEF, no overt evidence of problems related to dyssynchrony. 5. Atrial flutter: Had successful overdrive pacing of persistent atrial flutter in March 2020.  Maintaining sinus rhythm well for a long period of time now.  History of syncope on higher doses of bisoprolol related to orthostatic hypotension.  Tolerating metoprolol well.  On anticoagulation.  Not sure that Xarelto is appropriate since she has a history of rheumatic heart disease and a mitral valve prosthesis in place.  Very reluctant to switch to warfarin in the current coronavirus pandemic situation.  If we identify prosthetic valve thrombosis at TEE, warfarin will probably be a better choice than Xarelto. 6.  COPD: Limited access to pulmonary rehab with the coronavirus pandemic.  Overall she has done very well.  Note findings on recent CT of the chest. 7.  HLP: Recent lipid profile with all parameters in target range.  She's done an exceptionally good job with weight loss.  COVID-19 Education: The signs and symptoms of COVID-19 were discussed with the patient and how to seek care for testing (follow up with PCP or arrange E-visit).  The importance of social distancing was discussed today.  Time:   Today, I have spent 20 minutes with the patient with telehealth technology discussing the above problems.     Medication Adjustments/Labs and Tests Ordered: Current medicines are reviewed at length with the patient today.  Concerns regarding medicines are outlined above.   Tests Ordered: Orders Placed This Encounter  Procedures  . Basic metabolic panel  . CBC    Medication Changes: No orders of the defined types were  placed in this encounter.  Patient Instructions  Medication Instructions:  Your physician recommends that you continue on your current medications as directed. Please refer to the Current Medication list given to you today.  If you need a refill on your cardiac medications before your next appointment, please call your pharmacy.   Testing/Procedures: Your  physician has requested that you have a TEE. During a TEE, sound waves are used to create images of your heart. It provides your doctor with information about the size and shape of your heart and how well your heart's chambers and valves are working. In this test, a transducer is attached to the end of a flexible tube that's guided down your throat and into your esophagus (the tube leading from you mouth to your stomach) to get a more detailed image of your heart. You are not awake for the procedure. Please see the instruction sheet given to you today. For further information please visit [email or web address].  Follow-Up: Follow up with Dr. Sallyanne Kuster on 06/18/2019 at 3:20 (virtual visit)  Any Other Special Instructions Will Be Listed Below (If Applicable).  You are scheduled for a TEE on 06/04/2019 with Dr. Sallyanne Kuster.  Please arrive at the [facility] (Main Entrance A) at [facility]: [place], [place] [address] at 2 pm (1 hour prior to procedure)  DIET: Nothing to eat or drink after midnight except a sip of water with medications (see medication instructions below)  Medication Instructions: Continue your anticoagulant: Xarelto You will need to continue your anticoagulant after your procedure until you are told by your provider that it is safe to stop   Labs: Your provider would like for you to return on Friday 8/21  to have the following labs drawn: BMET and CBC. You do not need an appointment for the lab. Once in our office lobby there is a podium where you can sign in and ring the doorbell to alert [place] that you are here.  The lab is open from 8:00 am to 4:30 pm; closed for lunch from 12:45pm-1:45pm.  You will need to have the coronavirus test completed prior to your procedure. An appointment has been made at 11:40 on 05/31/2019. This is a Drive Up Visit at the [employer] [address]. Someone will direct you to the appropriate testing line. Stay in your car and someone will be with you shortly. Please make sure to have all other labs completed before this test because you will need to stay quarantined until your procedure.   You must have a responsible person to drive you home and stay in the waiting area during your procedure. Failure to do so could result in cancellation.  Bring your insurance cards.  *Special Note: Every effort is made to have your procedure done on time. Occasionally there are emergencies that occur at the hospital that may cause delays. Please be patient if a delay does occur.          Follow Up:  Virtual Visit 2 weeks after TEE  Signed, Sanda Klein, MD  05/15/2019 10:39 AM    [place]

## 2019-05-15 NOTE — Telephone Encounter (Signed)
Follow Up:     Returning [place] call from today.

## 2019-05-27 ENCOUNTER — Other Ambulatory Visit: Payer: Self-pay | Admitting: Family Medicine

## 2019-05-29 NOTE — Telephone Encounter (Signed)
Patient left a voicemail stating that pharmacy has sent over a refill request for her Lovastatin. Patient stated that she needs this refill sent to the pharmacy. Patient stated that is no need to call her back unless there is a problem with her refill.

## 2019-05-31 ENCOUNTER — Other Ambulatory Visit (HOSPITAL_COMMUNITY)
Admission: RE | Admit: 2019-05-31 | Discharge: 2019-05-31 | Disposition: A | Discharge status: Home / Self Care | Payer: Medicare Other | Source: Ambulatory Visit | Attending: Cardiovascular Disease | Admitting: Cardiovascular Disease

## 2019-05-31 ENCOUNTER — Other Ambulatory Visit: Payer: Self-pay

## 2019-05-31 DIAGNOSIS — Z01812 Encounter for preprocedural laboratory examination: Secondary | ICD-10-CM | POA: Insufficient documentation

## 2019-05-31 DIAGNOSIS — I10 Essential (primary) hypertension: Secondary | ICD-10-CM

## 2019-05-31 DIAGNOSIS — Z01818 Encounter for other preprocedural examination: Secondary | ICD-10-CM

## 2019-05-31 DIAGNOSIS — Z952 Presence of prosthetic heart valve: Secondary | ICD-10-CM

## 2019-05-31 DIAGNOSIS — Z20828 Contact with and (suspected) exposure to other viral communicable diseases: Secondary | ICD-10-CM | POA: Diagnosis not present

## 2019-05-31 LAB — BASIC METABOLIC PANEL
BUN/Creatinine Ratio: 22 (ref 12–28)
BUN: 18 mg/dL (ref 8–27)
CO2: 27 mmol/L (ref 20–29)
Calcium: 9.9 mg/dL (ref 8.7–10.3)
Chloride: 101 mmol/L (ref 96–106)
Creatinine, Ser: 0.82 mg/dL (ref 0.57–1.00)
GFR calc Af Amer: 82 mL/min/{1.73_m2} (ref >59)
GFR calc non Af Amer: 71 mL/min/{1.73_m2} (ref >59)
Glucose: 77 mg/dL (ref 65–99)
Potassium: 4.4 mmol/L (ref 3.5–5.2)
Sodium: 142 mmol/L (ref 134–144)

## 2019-05-31 LAB — SARS CORONAVIRUS 2 (TAT 6-24 HRS): SARS Coronavirus 2: NEGATIVE

## 2019-06-01 LAB — CBC
Hematocrit: 41.4 % (ref 34.0–46.6)
Hemoglobin: 13.9 g/dL (ref 11.1–15.9)
MCH: 33.3 pg — ABNORMAL HIGH (ref 26.6–33.0)
MCHC: 33.6 g/dL (ref 31.5–35.7)
MCV: 99 fL — ABNORMAL HIGH (ref 79–97)
Platelets: 76 10*3/uL — CL (ref 150–450)
RBC: 4.17 x10E6/uL (ref 3.77–5.28)
RDW: 11.4 % — ABNORMAL LOW (ref 11.7–15.4)
WBC: 7 10*3/uL (ref 3.4–10.8)

## 2019-06-03 ENCOUNTER — Other Ambulatory Visit: Payer: Self-pay | Admitting: *Deleted

## 2019-06-03 ENCOUNTER — Encounter: Payer: Medicare Other | Admitting: Cardiovascular Disease

## 2019-06-03 DIAGNOSIS — T82857A Stenosis of cardiac prosthetic devices, implants and grafts, initial encounter: Secondary | ICD-10-CM

## 2019-06-03 DIAGNOSIS — D691 Qualitative platelet defects: Secondary | ICD-10-CM

## 2019-06-03 NOTE — Progress Notes (Signed)
Pt states they have been quarantined since covid test. Denies fever/shortness of breath, cough. Pt understands visitor policy.

## 2019-06-04 ENCOUNTER — Encounter (HOSPITAL_COMMUNITY): Payer: Self-pay | Admitting: *Deleted

## 2019-06-04 ENCOUNTER — Encounter (HOSPITAL_COMMUNITY)
Admission: RE | Disposition: A | Discharge status: Home / Self Care | Payer: Medicare Other | Source: Ambulatory Visit | Attending: Cardiovascular Disease

## 2019-06-04 ENCOUNTER — Ambulatory Visit (HOSPITAL_COMMUNITY)
Admission: RE | Admit: 2019-06-04 | Discharge: 2019-06-04 | Disposition: A | Discharge status: Home / Self Care | Payer: Medicare Other | Source: Ambulatory Visit | Attending: Cardiovascular Disease | Admitting: Cardiovascular Disease

## 2019-06-04 ENCOUNTER — Ambulatory Visit (HOSPITAL_BASED_OUTPATIENT_CLINIC_OR_DEPARTMENT_OTHER)
Admission: RE | Admit: 2019-06-04 | Discharge: 2019-06-04 | Disposition: A | Discharge status: Home / Self Care | Payer: Medicare Other | Source: Ambulatory Visit | Attending: Cardiovascular Disease | Admitting: Cardiovascular Disease

## 2019-06-04 ENCOUNTER — Other Ambulatory Visit: Payer: Self-pay

## 2019-06-04 DIAGNOSIS — E669 Obesity, unspecified: Secondary | ICD-10-CM | POA: Diagnosis not present

## 2019-06-04 DIAGNOSIS — I052 Rheumatic mitral stenosis with insufficiency: Secondary | ICD-10-CM | POA: Diagnosis not present

## 2019-06-04 DIAGNOSIS — Z87891 Personal history of nicotine dependence: Secondary | ICD-10-CM | POA: Diagnosis not present

## 2019-06-04 DIAGNOSIS — Z953 Presence of xenogenic heart valve: Secondary | ICD-10-CM | POA: Insufficient documentation

## 2019-06-04 DIAGNOSIS — T82857A Stenosis of cardiac prosthetic devices, implants and grafts, initial encounter: Secondary | ICD-10-CM

## 2019-06-04 DIAGNOSIS — Z823 Family history of stroke: Secondary | ICD-10-CM | POA: Diagnosis not present

## 2019-06-04 DIAGNOSIS — Z888 Allergy status to other drugs, medicaments and biological substances status: Secondary | ICD-10-CM | POA: Diagnosis not present

## 2019-06-04 DIAGNOSIS — E785 Hyperlipidemia, unspecified: Secondary | ICD-10-CM | POA: Insufficient documentation

## 2019-06-04 DIAGNOSIS — I5032 Chronic diastolic (congestive) heart failure: Secondary | ICD-10-CM | POA: Diagnosis not present

## 2019-06-04 DIAGNOSIS — Q211 Atrial septal defect: Secondary | ICD-10-CM | POA: Diagnosis present

## 2019-06-04 DIAGNOSIS — I110 Hypertensive heart disease with heart failure: Secondary | ICD-10-CM | POA: Diagnosis not present

## 2019-06-04 DIAGNOSIS — J449 Chronic obstructive pulmonary disease, unspecified: Secondary | ICD-10-CM | POA: Insufficient documentation

## 2019-06-04 DIAGNOSIS — Z8249 Family history of ischemic heart disease and other diseases of the circulatory system: Secondary | ICD-10-CM | POA: Diagnosis not present

## 2019-06-04 DIAGNOSIS — G4730 Sleep apnea, unspecified: Secondary | ICD-10-CM | POA: Diagnosis not present

## 2019-06-04 DIAGNOSIS — K219 Gastro-esophageal reflux disease without esophagitis: Secondary | ICD-10-CM | POA: Diagnosis not present

## 2019-06-04 DIAGNOSIS — I342 Nonrheumatic mitral (valve) stenosis: Secondary | ICD-10-CM | POA: Diagnosis not present

## 2019-06-04 DIAGNOSIS — H5440 Blindness, one eye, unspecified eye: Secondary | ICD-10-CM | POA: Insufficient documentation

## 2019-06-04 DIAGNOSIS — Z885 Allergy status to narcotic agent status: Secondary | ICD-10-CM | POA: Insufficient documentation

## 2019-06-04 DIAGNOSIS — I700 Atherosclerosis of aorta: Secondary | ICD-10-CM | POA: Diagnosis not present

## 2019-06-04 DIAGNOSIS — I420 Dilated cardiomyopathy: Secondary | ICD-10-CM | POA: Diagnosis not present

## 2019-06-04 DIAGNOSIS — Z952 Presence of prosthetic heart valve: Secondary | ICD-10-CM | POA: Diagnosis not present

## 2019-06-04 DIAGNOSIS — M1990 Unspecified osteoarthritis, unspecified site: Secondary | ICD-10-CM | POA: Insufficient documentation

## 2019-06-04 DIAGNOSIS — Z882 Allergy status to sulfonamides status: Secondary | ICD-10-CM | POA: Insufficient documentation

## 2019-06-04 HISTORY — PX: TEE WITHOUT CARDIOVERSION: SHX5443

## 2019-06-04 LAB — CBC
HCT: 42 % (ref 36.0–46.0)
Hemoglobin: 13.8 g/dL (ref 12.0–15.0)
MCH: 33.6 pg (ref 26.0–34.0)
MCHC: 32.9 g/dL (ref 30.0–36.0)
MCV: 102.2 fL — ABNORMAL HIGH (ref 80.0–100.0)
Platelets: 98 10*3/uL — ABNORMAL LOW (ref 150–400)
RBC: 4.11 MIL/uL (ref 3.87–5.11)
RDW: 12.6 % (ref 11.5–15.5)
WBC: 5.8 10*3/uL (ref 4.0–10.5)
nRBC: 0 % (ref 0.0–0.2)

## 2019-06-04 SURGERY — ECHOCARDIOGRAM, TRANSESOPHAGEAL
Anesthesia: Moderate Sedation

## 2019-06-04 MED ORDER — BUTAMBEN-TETRACAINE-BENZOCAINE 2-2-14 % EX AERO
INHALATION_SPRAY | CUTANEOUS | Status: DC | PRN
  Administered 2019-06-04: 2 via TOPICAL

## 2019-06-04 MED ORDER — DIPHENHYDRAMINE HCL 50 MG/ML IJ SOLN
INTRAMUSCULAR | Status: AC
  Filled 2019-06-04: qty 1

## 2019-06-04 MED ORDER — MIDAZOLAM HCL (PF) 5 MG/ML IJ SOLN
INTRAMUSCULAR | Status: AC
  Filled 2019-06-04: qty 1

## 2019-06-04 MED ORDER — FENTANYL CITRATE (PF) 100 MCG/2ML IJ SOLN
INTRAMUSCULAR | Status: DC | PRN
  Administered 2019-06-04 (×2): 25 ug via INTRAVENOUS

## 2019-06-04 MED ORDER — MIDAZOLAM HCL (PF) 10 MG/2ML IJ SOLN
INTRAMUSCULAR | Status: DC | PRN
  Administered 2019-06-04: 2 mg via INTRAVENOUS
  Administered 2019-06-04: 1 mg via INTRAVENOUS

## 2019-06-04 MED ORDER — FENTANYL CITRATE (PF) 100 MCG/2ML IJ SOLN
INTRAMUSCULAR | Status: AC
  Filled 2019-06-04: qty 4

## 2019-06-04 MED ORDER — SODIUM CHLORIDE 0.9 % IV SOLN
INTRAVENOUS | Status: DC
  Administered 2019-06-04: 500 mL via INTRAVENOUS

## 2019-06-04 NOTE — Interval H&P Note (Signed)
History and Physical Interval Note:  06/04/2019 2:14 PM  [name]  has presented today for surgery, with the diagnosis of MITRAL VALVE STENOSIS.  The various methods of treatment have been discussed with the patient and family. After consideration of risks, benefits and other options for treatment, the patient has consented to  Procedure(s): TRANSESOPHAGEAL ECHOCARDIOGRAM (TEE) (N/A) as a surgical intervention.  The patient's history has been reviewed, patient examined, no change in status, stable for surgery.  I have reviewed the patient's chart and labs.  Questions were answered to the patient's satisfaction.     Jeidi Gilles

## 2019-06-04 NOTE — Progress Notes (Signed)
*   Echocardiogram Echocardiogram Transesophageal has been performed.  Jannett Celestine 06/04/2019, 3:40 PM

## 2019-06-04 NOTE — Op Note (Signed)
INDICATIONS: mitral valve bioprosthesis stenosis  PROCEDURE:   Informed consent was obtained prior to the procedure. The risks, benefits and alternatives for the procedure were discussed and the patient comprehended these risks.  Risks include, but are not limited to, cough, sore throat, vomiting, nausea, somnolence, esophageal and stomach trauma or perforation, bleeding, low blood pressure, aspiration, pneumonia, infection, trauma to the teeth and death.    After a procedural time-out, the oropharynx was anesthetized with 20% benzocaine spray.   During this procedure the patient was administered a total of Versed 3 mg and Fentanyl 50 mcg IV to achieve and maintain moderate conscious sedation.  The patient's heart rate, blood pressure, and oxygen saturationweare monitored continuously during the procedure. The period of conscious sedation was 26 minutes, of which I was present face-to-face 100% of this time.  The transesophageal probe was inserted in the esophagus and stomach without difficulty and multiple views were obtained.  The patient was kept under observation until the patient left the procedure room.  The patient left the procedure room in stable condition.   Agitated microbubble saline contrast was not administered.  COMPLICATIONS:    There were no immediate complications.  FINDINGS:  One of the three leaflets of the mitral prosthesis is immobile, stuck in a closed position. The other two leaflets appear supple and fully mobile. There is moderate mitral stenosis. There is no mitral insufficiency. No clear evidence of thrombus, but this appears to be the most likely cause for the immobile leaflet. 3D live imaging and post-processed rendering performed. The aortic valve prosthesis functions normally. Normal left ventricular systolic function. There is a small PFO with continuous let-to-right shunt, consistent with elevated left atrial pressure. Mild atherosclerosis of the descending  aorta.  RECOMMENDATIONS:    Anticoagulation with warfarin. Reevaluate the MV prosthesis with transthoracic echo in 3 months.  Time Spent Directly with the Patient:  60 minutes   [name] 06/04/2019, 3:23 PM

## 2019-06-04 NOTE — Discharge Instructions (Signed)

## 2019-06-05 ENCOUNTER — Other Ambulatory Visit: Payer: Self-pay | Admitting: *Deleted

## 2019-06-05 ENCOUNTER — Encounter (HOSPITAL_COMMUNITY): Payer: Self-pay | Admitting: Cardiovascular Disease

## 2019-06-05 DIAGNOSIS — I050 Rheumatic mitral stenosis: Secondary | ICD-10-CM

## 2019-06-10 ENCOUNTER — Other Ambulatory Visit: Payer: Self-pay | Admitting: Pharmacist

## 2019-06-10 MED ORDER — WARFARIN SODIUM 5 MG PO TABS: ORAL_TABLET | ORAL | 0 refills | Status: DC

## 2019-06-12 ENCOUNTER — Encounter: Payer: Self-pay | Admitting: Family Medicine

## 2019-06-14 ENCOUNTER — Other Ambulatory Visit: Payer: Self-pay

## 2019-06-14 ENCOUNTER — Telehealth: Payer: Self-pay | Admitting: Family Medicine

## 2019-06-14 ENCOUNTER — Ambulatory Visit (INDEPENDENT_AMBULATORY_CARE_PROVIDER_SITE_OTHER): Payer: Medicare Other | Admitting: Pharmacist Clinician (PhC)/ Clinical Pharmacy Specialist

## 2019-06-14 DIAGNOSIS — Z952 Presence of prosthetic heart valve: Secondary | ICD-10-CM | POA: Diagnosis not present

## 2019-06-14 DIAGNOSIS — Z7901 Long term (current) use of anticoagulants: Secondary | ICD-10-CM

## 2019-06-14 DIAGNOSIS — I484 Atypical atrial flutter: Secondary | ICD-10-CM | POA: Diagnosis not present

## 2019-06-14 HISTORY — DX: Long term (current) use of anticoagulants: Z79.01

## 2019-06-14 LAB — POCT INR: INR: 1.3 — AB (ref 2.0–3.0)

## 2019-06-14 NOTE — Telephone Encounter (Signed)
Patient was put on Coumadin by Dr.Croitoru,[facility].  Patient was told since [facility] is closer to her home she could come to our office to have her coumadin checked. If you can't reach patient on her home phone, you can call her on her cell phone [phone].

## 2019-06-14 NOTE — Telephone Encounter (Signed)
Noted! Thank you

## 2019-06-14 NOTE — Patient Instructions (Signed)
Continue with 1 tablet (5 mg) daily.  Take last dose of Xarelto tonight.  Repeat INR Tuesday.  Call [phone] with any questions/concerns

## 2019-06-14 NOTE — Telephone Encounter (Signed)
Spoke with patient and scheduled her for INR check on Tuesday 9/8 per last instructions from cardiology coumadin clinic.

## 2019-06-18 ENCOUNTER — Telehealth (INDEPENDENT_AMBULATORY_CARE_PROVIDER_SITE_OTHER): Payer: Medicare Other | Admitting: Cardiovascular Disease

## 2019-06-18 ENCOUNTER — Ambulatory Visit (INDEPENDENT_AMBULATORY_CARE_PROVIDER_SITE_OTHER): Payer: Medicare Other

## 2019-06-18 DIAGNOSIS — J449 Chronic obstructive pulmonary disease, unspecified: Secondary | ICD-10-CM

## 2019-06-18 DIAGNOSIS — I441 Atrioventricular block, second degree: Secondary | ICD-10-CM

## 2019-06-18 DIAGNOSIS — Z952 Presence of prosthetic heart valve: Secondary | ICD-10-CM

## 2019-06-18 DIAGNOSIS — Z7901 Long term (current) use of anticoagulants: Secondary | ICD-10-CM

## 2019-06-18 DIAGNOSIS — I484 Atypical atrial flutter: Secondary | ICD-10-CM | POA: Diagnosis not present

## 2019-06-18 DIAGNOSIS — I10 Essential (primary) hypertension: Secondary | ICD-10-CM

## 2019-06-18 DIAGNOSIS — T82857A Stenosis of cardiac prosthetic devices, implants and grafts, initial encounter: Secondary | ICD-10-CM

## 2019-06-18 DIAGNOSIS — I110 Hypertensive heart disease with heart failure: Secondary | ICD-10-CM

## 2019-06-18 DIAGNOSIS — R0600 Dyspnea, unspecified: Secondary | ICD-10-CM

## 2019-06-18 DIAGNOSIS — I5032 Chronic diastolic (congestive) heart failure: Secondary | ICD-10-CM

## 2019-06-18 DIAGNOSIS — R0609 Other forms of dyspnea: Secondary | ICD-10-CM

## 2019-06-18 LAB — POCT INR: INR: 2.4 (ref 2.0–3.0)

## 2019-06-18 NOTE — Patient Instructions (Signed)
Medication Instructions:  Your physician recommends that you continue on your current medications as directed. Please refer to the Current Medication list given to you today.  If you need a refill on your cardiac medications before your next appointment, please call your pharmacy.   Lab work: None ordered If you have labs (blood work) drawn today and your tests are completely normal, you will receive your results only by: [place] (if you have MyChart) OR A paper copy in the mail If you have any lab test that is abnormal or we need to change your treatment, we will call you to review the results.  Testing/Procedures: None ordered  Follow-Up: Keep your follow up appointment on 09/09/2019 at 10:20 am.

## 2019-06-18 NOTE — Patient Instructions (Signed)
INR:  2.4  Patient has been on 5mg  daily X 7 days.  She is to Continue with 1 tablet (5 mg) daily.  Recheck in 1 week per new start protocol.

## 2019-06-18 NOTE — Progress Notes (Signed)
Virtual Visit via Telephone Note   This visit type was conducted due to national recommendations for restrictions regarding the COVID-19 Pandemic (e.g. social distancing) in an effort to limit this patient's exposure and mitigate transmission in our community.  Due to her co-morbid illnesses, this patient is at least at moderate risk for complications without adequate follow up.  This format is felt to be most appropriate for this patient at this time.  The patient did not have access to video technology/had technical difficulties with video requiring transitioning to audio format only (telephone).  All issues noted in this document were discussed and addressed.  No physical exam could be performed with this format.  Please refer to the patient's chart for her  consent to telehealth for [facility].   Date:  06/18/2019   ID:  Janeiry Dicecco, [place] [birth year], MRN [id number]  Patient Location: Home Provider Location: Home  PCP:  Ria Bush, MD  Cardiologist:  Sanda Klein, MD  Electrophysiologist:  Constance Haw, MD   Evaluation Performed:  Follow-Up Visit  Chief Complaint: bioprosthetic mitral valve stenosis  History of Present Illness:    Larisa Astin Waggoner is a 73 y.o. 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, [place], 2017), (typical?) atrial flutter (overdrive pacing March [id number]).  Transthoracic echocardiography demonstrated increased gradients across the mitral valve prosthesis with unchanged gradients across the aortic valve prosthesis.  TEE showed 1 of the leaflets of the mitral valve biological prosthesis was fixed in a close position, likely due to thrombosis.  Her Xarelto was switched to warfarin.  She has chronic exertional dyspnea, that has not noticeably  changed.  However she has been more sedentary recently, secondary to degenerative cervical and lumbar spine disease.  She had a CT myelogram on July 29.  There was severe left foraminal stenosis at L3-4 as well as moderate left foraminal stenosis at C4-5-6.  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  INR 2.4 today, for the first time therapeutic on warfarin.  Her recent pacemaker download from 05/02/2019 shows normal device function with approximately 9 years of battery longevity, 86% atrial pacing and 100% ventricular pacing, no episodes of high ventricular rates.  After being in persistent atrial fibrillation from July 2019 through February 2020, she has had very few and very brief episodes of atrial mode switch since then, overall arrhythmia burden well under 1%.  The patient does not have symptoms concerning for COVID-19 infection (fever, chills, cough, or new shortness of breath).    Past Medical History:  Diagnosis Date  . Acute cystitis without hematuria 12/22/2015  . Anemia   . Arthritis   . CAP (community acquired pneumonia) 02/08/2016  . CAP (community acquired pneumonia) 02/08/2016  . Cataracts, bilateral    immature  . Chronic cholecystitis with calculus 01/21/2015   S/p cholecystectomy   . Chronic diastolic heart failure ([place]) 09/19/2014  . Chronic insomnia   . Constipation    takes Miralax daily as needed  . COPD (chronic obstructive pulmonary disease) (HCC)    Albuterol inhaler daily as needed;Duoneb daily as needed;Spiriva daily  . DDD (degenerative disc disease)    cervical - kyphosis with mod DD changes C5/6 and C6/7; lumbar - early DD at L2/3 ([place])  . Dyspnea    with exertion  . Essential hypertension    was  on meds but after appointment Dec 11 with Dr.C he took her off  . GERD (gastroesophageal reflux disease)    takes Protonix daily  . History of pneumonia 02/16/2016  . History of radiation therapy 05/30/11 to 07/07/11   rectum  . History of shingles   .  Hyperlipemia    takes Lovastatin daily  . Legally blind in left eye, as defined in [place]   . Lung nodule    RLL nodule-85mm stable 2006, April 2009, and June 2009  . Obesity   . Osteopenia 12/2014   T -1.5 hip  . Pancreatitis 11/2014   ?zpack related vs gallstone pancreatitis with abnormal HIDA scan pending cholecystectomy  . PONV (postoperative nausea and vomiting)   . Presence of permanent cardiac pacemaker   . Rectal cancer ([place]) 04/2011   T3N0; s/p lap LAR, s/p ileostomy, s/p reversal, s/p chemo  . Research study patient 08/26/2016  . Rheumatic heart disease mitral stenosis    mod MS by echo 02/2014  . S/P AVR (aortic valve replacement) 2015   bioprosthetic ([place])  . S/P MVR (mitral valve replacement) 2015   bioprosthetic ([place])  . Seizures ([place])    febrile seizures as a child  . Sepsis secondary to UTI ([place]) 08/22/2017  . Severe aortic valve stenosis  AVR 3/15 10/01/2013   mild-mod by echo 02/2014  . Sleep apnea    PT TOLD BORDERLINE-TRIED CPAP-DID NOT HELP-SHE DOES NOT USE CPAP  . Small bowel obstruction, partial ([place]) 08/2013   reolved without NGT placement.   . Vitamin B12 deficiency 02/28/2015   Start B12 shots 02/2015    Past Surgical History:  Procedure Laterality Date  . ANTERIOR LAT LUMBAR FUSION Left 07/17/2018   Procedure: Lumbar Two-Three Lumbar Three-Four Anterolateral decompression/interbody fusion with lateral plate fixation/Infuse;  Surgeon: Kristeen Miss, MD;  Location: [place];  Service: Neurosurgery;  Laterality: Left;  Lumbar Two-Three Lumbar Three-Four Anterolateral decompression/interbody fusion with lateral plate fixation/Infuse  . AORTIC VALVE REPLACEMENT N/A 12/12/2013   Procedure: AORTIC VALVE REPLACEMENT (AVR);  Surgeon: Gaye Pollack, MD; Service: Open Heart Surgery  . BACK SURGERY  2006, 2007   2006 SPACER, 2007 decompression and fusion L4/5  . BOWEL RESECTION  04/02/2012   Procedure: SMALL BOWEL RESECTION;  Surgeon: Stark Klein, MD;  Location: [facility];   Service: General;  Laterality: N/A;  . CARPAL TUNNEL RELEASE Bilateral   . CATARACT EXTRACTION W/ INTRAOCULAR LENS IMPLANT Left 10/18/2016  . CATARACT EXTRACTION W/ INTRAOCULAR LENS IMPLANT Right 12/13/2016   Dr. Kathrin Penner  . CHOLECYSTECTOMY N/A 01/21/2015   chronic cholecystitis, Stark Klein, MD  . COLON RESECTION  08/25/2011   Procedure: COLON RESECTION LAPAROSCOPIC;  Surgeon: Stark Klein, MD;  Location: [facility];  Service: General;  Laterality: N/A;  Laparoscopic Assisted Low Anterior Resection Diverting Ostomy and onQ pain pump  . COLONOSCOPY  03/2013   1 polyp, rpt 3 yrs Ardis Hughs)  . COLONOSCOPY WITH PROPOFOL N/A 06/09/2016   patent colo-colonic anastomosis, rpt 5 yrs Ardis Hughs)  . EP IMPLANTABLE DEVICE N/A 06/16/2016   Procedure: Pacemaker Implant;  Surgeon: Will Meredith Leeds, MD;  Location: [facility];  Service: Cardiovascular;  Laterality: N/A;  . FOOT SURGERY  left foot   hammer toe  . HAND SURGERY  Bil   carpal tunnel  . ILEOSTOMY  08/25/2011  . ILEOSTOMY CLOSURE  04/02/2012   Procedure: ILEOSTOMY TAKEDOWN;  Surgeon: Stark Klein, MD;  Location: [facility];  Service: General;  Laterality: N/A;  . INTRAOPERATIVE TRANSESOPHAGEAL  ECHOCARDIOGRAM N/A 12/12/2013   Procedure: INTRAOPERATIVE TRANSESOPHAGEAL ECHOCARDIOGRAM;  Surgeon: Gaye Pollack, MD;  Location[facility];  Service: Open Heart Surgery;  Laterality: N/A;  . LEFT AND RIGHT HEART CATHETERIZATION WITH CORONARY ANGIOGRAM N/A 11/27/2013   Procedure: LEFT AND RIGHT HEART CATHETERIZATION WITH CORONARY ANGIOGRAM;  Surgeon: Sanda Klein, MD;  Location: [facility];  Service: Cardiovascular;  Laterality: N/A;  . MITRAL VALVE REPLACEMENT N/A 12/12/2013   Procedure: MITRAL VALVE (MV) REPLACEMENT OR REPAIR;  Surgeon: Gaye Pollack, MD; Service: Open Heart Surgery  . TEE WITHOUT CARDIOVERSION N/A 11/29/2013   Procedure: TRANSESOPHAGEAL ECHOCARDIOGRAM (TEE);  Surgeon: Sueanne Margarita, MD;  Location: [facility];  Service: Cardiovascular;   Laterality: N/A;  . TEE WITHOUT CARDIOVERSION N/A 06/04/2019   Procedure: TRANSESOPHAGEAL ECHOCARDIOGRAM (TEE);  Surgeon: Sanda Klein, MD;  Location: [facility];  Service: Cardiovascular;  Laterality: N/A;  . TONSILLECTOMY       Current Meds  Medication Sig  . acetaminophen (TYLENOL) 500 MG tablet Take 2 tablets (1,000 mg total) by mouth daily.  Marland Kitchen albuterol (VENTOLIN HFA) 108 (90 Base) MCG/ACT inhaler Inhale 2 puffs into the lungs every 6 (six) hours as needed for wheezing.  Marland Kitchen amoxicillin (AMOXIL) 500 MG capsule TAKE 4 CAPSULES BY MOUTH 30-60 MINUTES PRIOR TO APPOINTMENT  . Biotin 1000 MCG tablet Take 1,000 mcg by mouth daily.  Marland Kitchen BREO ELLIPTA 200-25 MCG/INH AEPB INHALE 1 PUFF INTO THE LUNGS ONCE DAILY (Patient taking differently: Inhale 1 puff into the lungs daily. )  . Calcium Carb-Cholecalciferol (CALCIUM 600+D) 600-800 MG-UNIT TABS Take 1 tablet by mouth every evening.  . Cholecalciferol (VITAMIN D) 2000 UNITS CAPS Take 2,000 Units by mouth every evening.   . docusate sodium (COLACE) 100 MG capsule Take 200 mg by mouth every evening.  . furosemide (LASIX) 40 MG tablet TAKE 1 TABLET BY MOUTH EVERY DAY 5 TIMES A WEEK (Patient taking differently: Take 40 mg by mouth See admin instructions. 5 days a week)  . HYDROcodone-acetaminophen (NORCO/VICODIN) 5-325 MG tablet Take 1 tablet by mouth every 4 (four) hours as needed for severe pain ((score 7 to 10)).  Marland Kitchen ipratropium-albuterol (DUONEB) 0.5-2.5 (3) MG/3ML SOLN Take 3 mLs by nebulization every 6 (six) hours as needed. (Patient taking differently: Take 3 mLs by nebulization every 6 (six) hours as needed (asthma). )  . lovastatin (MEVACOR) 40 MG tablet TAKE 1 TABLET(40 MG) BY MOUTH AT BEDTIME  . montelukast (SINGULAIR) 10 MG tablet Take 1 tablet (10 mg total) by mouth at bedtime.  . Multiple Vitamins-Minerals (EMERGEN-C IMMUNE PO) Take 1 packet by mouth daily.  . Multiple Vitamins-Minerals (PRESERVISION AREDS 2 PO) Take 2 capsules by mouth  every evening.   . nitrofurantoin, macrocrystal-monohydrate, (MACROBID) 100 MG capsule Take 100 mg by mouth every evening.   . polycarbophil (FIBERCON) 625 MG tablet Take 625 mg by mouth every evening.   . polyethylene glycol (MIRALAX / GLYCOLAX) packet Take 17 g by mouth daily.  . potassium chloride (K-DUR) 10 MEQ tablet TAKE 2 TABLETS BY MOUTH ONCE DAILY (Patient taking differently: Take 20 mEq by mouth See admin instructions. 5 days a week with lasix)  . Talc (ZEASORB EX) Apply 1 application topically daily as needed (yeast infection under stomach). Under skin folds (abdomen)  . tiotropium (SPIRIVA HANDIHALER) 18 MCG inhalation capsule INHALE THE CONTENTS OF ONE CAPSULE IN THE HANDIHALER ONCE DAILY  . TOVIAZ 8 MG TB24 tablet Take 8 mg by mouth every evening.   . traMADol (ULTRAM) 50 MG tablet  Take 0.5-1 tablets (25-50 mg total) by mouth 2 (two) times daily as needed for moderate pain.  . vitamin B-12 (CYANOCOBALAMIN) 500 MCG tablet Take 500 mcg by mouth every other day.   . warfarin (COUMADIN) 5 MG tablet Take 5mg  daily or as directed by coumadin clinic     Allergies:   Codeine, Prednisone, Sulfa antibiotics, and Sulfonamide derivatives   Social History   Tobacco Use  . Smoking status: Former Smoker    Packs/day: 1.50    Years: 10.00    Pack years: 15.00    Types: Cigarettes    Quit date: 12/28/1974    Years since quitting: 44.5  . Smokeless tobacco: Never Used  . Tobacco comment: quit smoking in 1976  Substance Use Topics  . Alcohol use: Not Currently    Alcohol/week: 2.0 standard drinks    Types: 2 Glasses of wine per week    Comment: very rare  . Drug use: No     Family Hx: The patient's family history includes Breast cancer (age of onset: 35) in her maternal aunt; CAD (age of onset: 8) in her son; Diabetes in her mother; Heart attack in her maternal grandfather; Heart attack (age of onset: 35) in her son; Heart disease in her mother; Hypertension in her mother; Lung cancer  in her father; Stroke in her mother. There is no history of Colon cancer, Esophageal cancer, Stomach cancer, or Rectal cancer.  ROS:   Please see the history of present illness.     All other systems reviewed and are negative.   Prior CV studies:   The following studies were reviewed today:  Echocardiogram May 14, 2019  Labs/Other Tests and Data Reviewed:    EKG:  An ECG dated 04/16/2019 was personally reviewed today and demonstrated:  Atrioventricular sequential pacing with positive R waves in lead V1  Recent Labs: 04/23/2019: ALT 36; TSH 2.24 05/31/2019: BUN 18; Creatinine, Ser 0.82; Potassium 4.4; Sodium 142 06/04/2019: Hemoglobin 13.8; Platelets 98   Recent Lipid Panel Lab Results  Component Value Date/Time   CHOL 135 04/23/2019 07:48 AM   TRIG 47.0 04/23/2019 07:48 AM   HDL 56.60 04/23/2019 07:48 AM   CHOLHDL 2 04/23/2019 07:48 AM   LDLCALC 69 04/23/2019 07:48 AM   LDLDIRECT 111.0 06/26/2015 09:28 AM   CT chest from 04/22/2019 shows "waxing/waning peribronchovascular nodularity in the lungs bilaterally, suggesting a combination of postinfectious inflammatory scarring and atypical mycobacterial infection rather than metastatic disease".  There was evidence of three-vessel coronary and aortic atherosclerosis.  Tiny right pleural effusion, unchanged. On my review, highly suspicious that the tip of the right ventricular pacing lead is in the pericardial space.  This would explain the positive R waves in leads V1.   Wt Readings from Last 3 Encounters:  06/18/19 138 lb 4 oz (62.7 kg)  05/15/19 137 lb (62.1 kg)  05/08/19 137 lb (62.1 kg)     Objective:    Vital Signs:  BP 130/71   Pulse 78   Ht 5\' 6"  (1.676 m)   Wt 138 lb 4 oz (62.7 kg)   BMI 22.31 kg/m     VITAL SIGNS:  reviewed Unable to examine  ASSESSMENT & PLAN:    1. CHF: The function of the spine really preclude functional assessment.  She does not have edema.  She is lost a lot of true weight..  Normal  LVEF. 2.  Prosthetic MV stenosis: By TEE one of the leaflets of her prosthetic valve was partially thrombosed  with immobilization in a close position.  The Xarelto was switched to warfarin.  Repeat assessment of transthoracic echo after at least 8--12 weeks..  3. S/p AVR: Aortic valve biological prosthesis is functioning normally. 4. 2nd deg AV block: Has 100% ventricular pacing despite VIP programming.  Preserved LVEF, no overt evidence of problems related to dyssynchrony. 5. Atrial flutter: Had successful overdrive pacing of persistent atrial flutter in March 2020.  Maintaining sinus rhythm well for a long period of time now.  History of syncope on higher doses of bisoprolol related to orthostatic hypotension.  Tolerating metoprolol well.  On anticoagulation.   6.  COPD: Limited access to pulmonary rehab with the coronavirus pandemic.  Overall she has done very well.  Note findings on recent CT of the chest. 7.  HLP: Recent lipid profile with all parameters in target range.  She's done an exceptionally good job with weight loss. 8.  COVID-19 Education: The signs and symptoms of COVID-19 were discussed with the patient and how to seek care for testing (follow up with PCP or arrange E-visit).  The importance of social distancing was discussed today.  Time:   Today, I have spent 20 minutes with the patient with telehealth technology discussing the above problems.     Medication Adjustments/Labs and Tests Ordered: Current medicines are reviewed at length with the patient today.  Concerns regarding medicines are outlined above.   Tests Ordered: No orders of the defined types were placed in this encounter.   Medication Changes: No orders of the defined types were placed in this encounter.  Patient Instructions  Medication Instructions:  Your physician recommends that you continue on your current medications as directed. Please refer to the Current Medication list given to you today.  If you  need a refill on your cardiac medications before your next appointment, please call your pharmacy.   Lab work: None ordered If you have labs (blood work) drawn today and your tests are completely normal, you will receive your results only by: [place] (if you have MyChart) OR A paper copy in the mail If you have any lab test that is abnormal or we need to change your treatment, we will call you to review the results.  Testing/Procedures: None ordered  Follow-Up: Keep your follow up appointment on 09/09/2019 at 10:20 am.         Follow Up:  ECHO scheduled for November, with subsequent office follow up  Signed, Sanda Klein, MD  06/18/2019 5:10 PM    [place]

## 2019-06-19 MED ORDER — POTASSIUM CHLORIDE ER 10 MEQ PO TBCR: 20.0000 meq | EXTENDED_RELEASE_TABLET | Freq: Every day | ORAL | 11 refills | Status: DC

## 2019-06-19 MED ORDER — FUROSEMIDE 40 MG PO TABS: 40.0000 mg | ORAL_TABLET | Freq: Every day | ORAL | 11 refills | Status: DC

## 2019-06-25 ENCOUNTER — Ambulatory Visit (INDEPENDENT_AMBULATORY_CARE_PROVIDER_SITE_OTHER): Payer: Medicare Other

## 2019-06-25 DIAGNOSIS — Z7901 Long term (current) use of anticoagulants: Secondary | ICD-10-CM | POA: Diagnosis not present

## 2019-06-25 DIAGNOSIS — I484 Atypical atrial flutter: Secondary | ICD-10-CM

## 2019-06-25 LAB — POCT INR: INR: 2.6 (ref 2.0–3.0)

## 2019-06-25 NOTE — Patient Instructions (Signed)
NR:  2.6  Patient is to continue with 1 tablet (5 mg) daily.  Recheck in 2 weeks per new start protocol.

## 2019-06-29 ENCOUNTER — Other Ambulatory Visit: Payer: Self-pay | Admitting: Family Medicine

## 2019-07-05 ENCOUNTER — Other Ambulatory Visit: Payer: Self-pay | Admitting: *Deleted

## 2019-07-07 NOTE — Progress Notes (Signed)
Noted  

## 2019-07-08 ENCOUNTER — Other Ambulatory Visit: Payer: Self-pay

## 2019-07-08 MED ORDER — WARFARIN SODIUM 5 MG PO TABS: ORAL_TABLET | ORAL | 0 refills | Status: DC

## 2019-07-11 ENCOUNTER — Ambulatory Visit (INDEPENDENT_AMBULATORY_CARE_PROVIDER_SITE_OTHER): Payer: Medicare Other | Admitting: General Practice

## 2019-07-11 DIAGNOSIS — Z23 Encounter for immunization: Secondary | ICD-10-CM | POA: Diagnosis not present

## 2019-07-11 DIAGNOSIS — I484 Atypical atrial flutter: Secondary | ICD-10-CM

## 2019-07-11 DIAGNOSIS — Z7901 Long term (current) use of anticoagulants: Secondary | ICD-10-CM

## 2019-07-11 LAB — POCT INR: INR: 2.1 (ref 2.0–3.0)

## 2019-07-11 NOTE — Patient Instructions (Signed)
Pre visit review using our clinic review tool, if applicable. No additional management support is needed unless otherwise documented below in the visit note.  Patient is to continue with 1 tablet (5 mg) daily.  Recheck in 3 weeks per new start protocol.

## 2019-07-22 ENCOUNTER — Other Ambulatory Visit (INDEPENDENT_AMBULATORY_CARE_PROVIDER_SITE_OTHER): Payer: Medicare Other

## 2019-07-22 DIAGNOSIS — R7401 Elevation of levels of liver transaminase levels: Secondary | ICD-10-CM | POA: Diagnosis not present

## 2019-07-22 LAB — CBC WITH DIFFERENTIAL/PLATELET
Basophils Absolute: 0.1 10*3/uL (ref 0.0–0.1)
Basophils Relative: 1.2 % (ref 0.0–3.0)
Eosinophils Absolute: 0.1 10*3/uL (ref 0.0–0.7)
Eosinophils Relative: 2.5 % (ref 0.0–5.0)
HCT: 42.7 % (ref 36.0–46.0)
Hemoglobin: 14.2 g/dL (ref 12.0–15.0)
Lymphocytes Relative: 13.9 % (ref 12.0–46.0)
Lymphs Abs: 0.8 10*3/uL (ref 0.7–4.0)
MCHC: 33.2 g/dL (ref 30.0–36.0)
MCV: 100.8 fl — ABNORMAL HIGH (ref 78.0–100.0)
Monocytes Absolute: 0.6 10*3/uL (ref 0.1–1.0)
Monocytes Relative: 11.2 % (ref 3.0–12.0)
Neutro Abs: 4 10*3/uL (ref 1.4–7.7)
Neutrophils Relative %: 71.2 % (ref 43.0–77.0)
Platelets: 154 10*3/uL (ref 150.0–400.0)
RBC: 4.24 Mil/uL (ref 3.87–5.11)
RDW: 13.5 % (ref 11.5–15.5)
WBC: 5.6 10*3/uL (ref 4.0–10.5)

## 2019-07-22 LAB — HEPATIC FUNCTION PANEL
ALT: 124 U/L — ABNORMAL HIGH (ref 0–35)
AST: 48 U/L — ABNORMAL HIGH (ref 0–37)
Albumin: 4 g/dL (ref 3.5–5.2)
Alkaline Phosphatase: 110 U/L (ref 39–117)
Bilirubin, Direct: 0.2 mg/dL (ref 0.0–0.3)
Total Bilirubin: 1.1 mg/dL (ref 0.2–1.2)
Total Protein: 7 g/dL (ref 6.0–8.3)

## 2019-07-22 NOTE — Progress Notes (Signed)
Noted  

## 2019-07-23 LAB — HEPATITIS PANEL, ACUTE
Hep A IgM: NONREACTIVE
Hep B C IgM: NONREACTIVE
Hepatitis B Surface Ag: NONREACTIVE
Hepatitis C Ab: NONREACTIVE
SIGNAL TO CUT-OFF: 0.03 (ref <1.00)

## 2019-07-25 ENCOUNTER — Telehealth: Payer: Self-pay

## 2019-07-25 DIAGNOSIS — Z85048 Personal history of other malignant neoplasm of rectum, rectosigmoid junction, and anus: Secondary | ICD-10-CM

## 2019-07-25 DIAGNOSIS — R7401 Elevation of levels of liver transaminase levels: Secondary | ICD-10-CM

## 2019-07-25 NOTE — Telephone Encounter (Signed)
Pt left v/m requesting cb with lab results.

## 2019-07-25 NOTE — Telephone Encounter (Signed)
Notified pt Dr. Darnell Level has not reviewed results just yet.  Encouraged her to watch for MyChart message.  Verbalizes understanding and asks if we call with results tomorrow, to call her cell #:  [phone].

## 2019-07-26 NOTE — Addendum Note (Signed)
Addended by: Ria Bush on: 07/26/2019 03:56 PM   Modules accepted: Orders

## 2019-07-26 NOTE — Telephone Encounter (Addendum)
Results released via mychart. plz also call to notify her - liver function remains elevated. Viral hepatitis panel was normal, and blood counts were ok. Given persistent elevation, would like to proceed with abdominal ultrasound for further evaluation.

## 2019-07-26 NOTE — Telephone Encounter (Signed)
Spoke with pt relaying Dr. G's message. Pt verbalizes understanding.  

## 2019-07-26 NOTE — Telephone Encounter (Signed)
Pt is anxious to know lab results especially the liver enzymes. Pt request cb at [phone] or [phone],

## 2019-08-01 ENCOUNTER — Other Ambulatory Visit: Payer: Self-pay

## 2019-08-01 ENCOUNTER — Ambulatory Visit (INDEPENDENT_AMBULATORY_CARE_PROVIDER_SITE_OTHER): Payer: Medicare Other | Admitting: General Practice

## 2019-08-01 DIAGNOSIS — Z7901 Long term (current) use of anticoagulants: Secondary | ICD-10-CM

## 2019-08-01 DIAGNOSIS — I484 Atypical atrial flutter: Secondary | ICD-10-CM

## 2019-08-01 LAB — POCT INR: INR: 2.1 (ref 2.0–3.0)

## 2019-08-01 NOTE — Patient Instructions (Addendum)
Pre visit review using our clinic review tool, if applicable. No additional management support is needed unless otherwise documented below in the visit note.  Patient is to continue with 1 tablet (5 mg) daily.  Recheck in 4 weeks per new start protocol.

## 2019-08-02 ENCOUNTER — Ambulatory Visit (INDEPENDENT_AMBULATORY_CARE_PROVIDER_SITE_OTHER): Payer: Medicare Other | Admitting: *Deleted

## 2019-08-02 ENCOUNTER — Ambulatory Visit
Admission: RE | Admit: 2019-08-02 | Discharge: 2019-08-02 | Disposition: A | Discharge status: Home / Self Care | Payer: Medicare Other | Source: Ambulatory Visit | Attending: Family Medicine | Admitting: Family Medicine

## 2019-08-02 ENCOUNTER — Other Ambulatory Visit: Payer: Self-pay | Admitting: Family Medicine

## 2019-08-02 ENCOUNTER — Telehealth: Payer: Self-pay

## 2019-08-02 DIAGNOSIS — K760 Fatty (change of) liver, not elsewhere classified: Secondary | ICD-10-CM

## 2019-08-02 DIAGNOSIS — Q453 Other congenital malformations of pancreas and pancreatic duct: Secondary | ICD-10-CM

## 2019-08-02 DIAGNOSIS — I441 Atrioventricular block, second degree: Secondary | ICD-10-CM

## 2019-08-02 DIAGNOSIS — Z85048 Personal history of other malignant neoplasm of rectum, rectosigmoid junction, and anus: Secondary | ICD-10-CM

## 2019-08-02 DIAGNOSIS — R7401 Elevation of levels of liver transaminase levels: Secondary | ICD-10-CM

## 2019-08-02 DIAGNOSIS — R55 Syncope and collapse: Secondary | ICD-10-CM

## 2019-08-02 NOTE — Telephone Encounter (Signed)
Pt left v/m requesting [place] of abd result cb today so she will not worry over weekend.

## 2019-08-02 NOTE — Telephone Encounter (Signed)
Spoke with patient.

## 2019-08-04 LAB — CUP PACEART REMOTE DEVICE CHECK
Battery Remaining Longevity: 106 mo
Battery Remaining Percentage: 95.5 %
Battery Voltage: 2.99 V
Brady Statistic AP VP Percent: 79 %
Brady Statistic AP VS Percent: 1 %
Brady Statistic AS VP Percent: 21 %
Brady Statistic AS VS Percent: 1 %
Brady Statistic RA Percent Paced: 79 %
Brady Statistic RV Percent Paced: 99 %
Date Time Interrogation Session: 20201021074308
Implantable Lead Implant Date: 20170907
Implantable Lead Implant Date: 20170907
Implantable Lead Location: 753859
Implantable Lead Location: 753860
Implantable Pulse Generator Implant Date: 20170907
Lead Channel Impedance Value: 400 Ohm
Lead Channel Impedance Value: 490 Ohm
Lead Channel Pacing Threshold Amplitude: 0.5 V
Lead Channel Pacing Threshold Amplitude: 0.625 V
Lead Channel Pacing Threshold Pulse Width: 0.5 ms
Lead Channel Pacing Threshold Pulse Width: 0.5 ms
Lead Channel Sensing Intrinsic Amplitude: 4 mV
Lead Channel Sensing Intrinsic Amplitude: 6.9 mV
Lead Channel Setting Pacing Amplitude: 0.875
Lead Channel Setting Pacing Amplitude: 2 V
Lead Channel Setting Pacing Pulse Width: 0.5 ms
Lead Channel Setting Sensing Sensitivity: 2 mV
Pulse Gen Model: 2272
Pulse Gen Serial Number: 7945283

## 2019-08-05 ENCOUNTER — Other Ambulatory Visit (INDEPENDENT_AMBULATORY_CARE_PROVIDER_SITE_OTHER): Payer: Medicare Other

## 2019-08-05 DIAGNOSIS — R7401 Elevation of levels of liver transaminase levels: Secondary | ICD-10-CM | POA: Diagnosis not present

## 2019-08-05 LAB — BUN: BUN: 23 mg/dL (ref 6–23)

## 2019-08-05 LAB — CREATININE, SERUM: Creatinine, Ser: 0.76 mg/dL (ref 0.40–1.20)

## 2019-08-07 ENCOUNTER — Other Ambulatory Visit: Payer: Self-pay

## 2019-08-07 ENCOUNTER — Telehealth: Payer: Self-pay

## 2019-08-07 ENCOUNTER — Ambulatory Visit (INDEPENDENT_AMBULATORY_CARE_PROVIDER_SITE_OTHER)
Admission: RE | Admit: 2019-08-07 | Discharge: 2019-08-07 | Disposition: A | Discharge status: Home / Self Care | Payer: Medicare Other | Source: Ambulatory Visit | Attending: Family Medicine | Admitting: Family Medicine

## 2019-08-07 DIAGNOSIS — Q453 Other congenital malformations of pancreas and pancreatic duct: Secondary | ICD-10-CM | POA: Diagnosis not present

## 2019-08-07 DIAGNOSIS — K760 Fatty (change of) liver, not elsewhere classified: Secondary | ICD-10-CM

## 2019-08-07 DIAGNOSIS — R7401 Elevation of levels of liver transaminase levels: Secondary | ICD-10-CM

## 2019-08-07 MED ORDER — IOHEXOL 300 MG/ML  SOLN
100.0000 mL | Freq: Once | INTRAMUSCULAR | Status: AC | PRN
  Administered 2019-08-07: 14:00:00 100 mL via INTRAVENOUS

## 2019-08-07 NOTE — Telephone Encounter (Signed)
Pt left v/m; pt was advised Dr Darnell Level should have CT results today and if Dr Darnell Level gets results pt request cb with CT results.Please advise.

## 2019-08-07 NOTE — Telephone Encounter (Signed)
No results yet. Notified pt via mychart.

## 2019-08-08 NOTE — Telephone Encounter (Signed)
Call pt.  Several issues.    1. Scattered bibasilar nodular densities in the lungs- please see if she has an current respiratory sx.  If so, let me know if this is progressive.    2. There is mild dilatation of the biliary tree and common bile duct, likely post cholecystectomy.  The liver is unremarkable on CT but she has h/o abnormal LFTs and coarse texture on U/s.   3. Pancreas: Unremarkable.   4. Age indeterminate, likely subacute or chronic fracture of the right sacral alum. How much pain is he having in the R lower back?  Is this worse than normal?    Please let me know about the above.  Will likely need input from PCP.  Routed to PCP as FYI.

## 2019-08-08 NOTE — Telephone Encounter (Signed)
Patient advised.   Patient says her back hurts a lot but once she is able to sit down, it goes away in about 30 seconds.

## 2019-08-08 NOTE — Telephone Encounter (Signed)
Does she have any pulmonary symptoms currently?

## 2019-08-08 NOTE — Telephone Encounter (Signed)
Sorry, no pulmonary problems at all.  Patient says she has been aware of these nodules for some time and have been told it is not a problem by pulmonary.

## 2019-08-08 NOTE — Telephone Encounter (Signed)
Pt is calling again about her results - she was alerted on mychart that her results were available.   Please advise Dr Damita Dunnings. Results in Epic  IMPRESSION: 1. Bibasilar nodular densities with tree-in-bud appearance most consistent with an inflammatory/infectious process. Aspiration is not excluded. Clinical correlation is recommended. 2. Constipation. No bowel obstruction or active inflammation. Normal appendix. 3. Cholecystectomy.  Aortic Atherosclerosis (ICD10-I70.0).

## 2019-08-08 NOTE — Telephone Encounter (Signed)
In that case, I would hold off doing anything else until PCP is able to review this.  Thanks.  Routed to PCP as FYI.

## 2019-08-09 NOTE — Progress Notes (Addendum)
Remote pacemaker transmission.   

## 2019-08-11 ENCOUNTER — Encounter: Payer: Self-pay | Admitting: Family Medicine

## 2019-08-11 NOTE — Telephone Encounter (Signed)
Noted. Thank you. Addressed via mychart.

## 2019-08-12 ENCOUNTER — Other Ambulatory Visit (HOSPITAL_COMMUNITY): Payer: Medicare Other

## 2019-08-13 ENCOUNTER — Ambulatory Visit: Payer: Medicare Other | Admitting: Family Medicine

## 2019-08-29 ENCOUNTER — Ambulatory Visit (INDEPENDENT_AMBULATORY_CARE_PROVIDER_SITE_OTHER): Payer: Medicare Other | Admitting: General Practice

## 2019-08-29 ENCOUNTER — Other Ambulatory Visit: Payer: Self-pay | Admitting: Internal Medicine

## 2019-08-29 ENCOUNTER — Other Ambulatory Visit: Payer: Self-pay

## 2019-08-29 DIAGNOSIS — I484 Atypical atrial flutter: Secondary | ICD-10-CM

## 2019-08-29 DIAGNOSIS — Z7901 Long term (current) use of anticoagulants: Secondary | ICD-10-CM | POA: Diagnosis not present

## 2019-08-29 LAB — POCT INR: INR: 1.2 — AB (ref 2.0–3.0)

## 2019-08-29 NOTE — Patient Instructions (Addendum)
Pre visit review using our clinic review tool, if applicable. No additional management support is needed unless otherwise documented below in the visit note.  Take 1 1/2 tablets today, tomorrow and Saturday.  On Sunday continue to take 1 tablet daily.  Re-check on 2 weeks.

## 2019-08-30 ENCOUNTER — Encounter: Payer: Self-pay | Admitting: Family Medicine

## 2019-09-02 ENCOUNTER — Other Ambulatory Visit: Payer: Self-pay | Admitting: *Deleted

## 2019-09-02 ENCOUNTER — Ambulatory Visit (HOSPITAL_COMMUNITY): Payer: Medicare Other | Attending: Cardiovascular Disease

## 2019-09-02 ENCOUNTER — Other Ambulatory Visit: Payer: Self-pay

## 2019-09-02 DIAGNOSIS — I050 Rheumatic mitral stenosis: Secondary | ICD-10-CM

## 2019-09-09 ENCOUNTER — Encounter: Payer: Self-pay | Admitting: Cardiovascular Disease

## 2019-09-09 ENCOUNTER — Telehealth (INDEPENDENT_AMBULATORY_CARE_PROVIDER_SITE_OTHER): Payer: Medicare Other | Admitting: Cardiovascular Disease

## 2019-09-09 VITALS — BP 125/66 | HR 66 | Ht 66.0 in | Wt 135.6 lb

## 2019-09-09 DIAGNOSIS — I484 Atypical atrial flutter: Secondary | ICD-10-CM

## 2019-09-09 DIAGNOSIS — I10 Essential (primary) hypertension: Secondary | ICD-10-CM

## 2019-09-09 DIAGNOSIS — I5032 Chronic diastolic (congestive) heart failure: Secondary | ICD-10-CM

## 2019-09-09 DIAGNOSIS — E7800 Pure hypercholesterolemia, unspecified: Secondary | ICD-10-CM

## 2019-09-09 DIAGNOSIS — J449 Chronic obstructive pulmonary disease, unspecified: Secondary | ICD-10-CM

## 2019-09-09 DIAGNOSIS — Z7901 Long term (current) use of anticoagulants: Secondary | ICD-10-CM

## 2019-09-09 DIAGNOSIS — Z952 Presence of prosthetic heart valve: Secondary | ICD-10-CM

## 2019-09-09 DIAGNOSIS — T82857D Stenosis of cardiac prosthetic devices, implants and grafts, subsequent encounter: Secondary | ICD-10-CM

## 2019-09-09 DIAGNOSIS — I441 Atrioventricular block, second degree: Secondary | ICD-10-CM | POA: Diagnosis not present

## 2019-09-09 NOTE — Progress Notes (Signed)
Virtual Visit via Telephone Note   This visit type was conducted due to national recommendations for restrictions regarding the COVID-19 Pandemic (e.g. social distancing) in an effort to limit this patient's exposure and mitigate transmission in our community.  Due to her co-morbid illnesses, this patient is at least at moderate risk for complications without adequate follow up.  This format is felt to be most appropriate for this patient at this time.  The patient did not have access to video technology/had technical difficulties with video requiring transitioning to audio format only (telephone).  All issues noted in this document were discussed and addressed.  No physical exam could be performed with this format.  Please refer to the patient's chart for her  consent to telehealth for [facility].   Date:  09/09/2019   ID:  [name], [place] [birth year], MRN [id number]  Patient Location: Home Provider Location: Home  PCP:  Ria Bush, MD  Cardiologist:  Sanda Klein, MD  Electrophysiologist:  Constance Haw, MD   Evaluation Performed:  Follow-Up Visit  Chief Complaint: bioprosthetic mitral valve stenosis  History of Present Illness:    [name] is a 73 y.o. 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, [place], 2017), (typical?) atrial flutter (overdrive pacing March [id number]).  Transthoracic echocardiography demonstrated increased gradients across the mitral valve prosthesis with unchanged gradients across the aortic valve prosthesis.  TEE showed 1 of the leaflets of the mitral valve biological prosthesis was fixed in a close position, likely due to thrombosis.  Her Xarelto was switched to warfarin.  Her follow-up echocardiogram performed last week shows  improvement in mitral valve parameters.  The mean gradient has decreased from 11 mmHg to 9 mmHg (baseline is 6-7 mmHg).  The pressure half-time is now 106 ms, down from 130 ms at the time of her TEE in August 2020 and comparing favorably to pressure half-time of 108 ms in 2016-2017.  Left ventricular systolic function remains normal/hyperdynamic.  She has noticed increased bruising.  At her last Coumadin clinic visit her INR was 1.2 and the dose of warfarin was increased.  She has another appointment this coming Thursday.  She has not had overt bleeding.  There has been no change in her chronic exertional dyspnea, NYHA functional class II.  She remains relatively sedentary due to problems with her cervical spine.    She had a CT myelogram on July 29.  There was severe left foraminal stenosis at L3-4 as well as moderate left foraminal stenosis at C4-5-6.  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   Her recent pacemaker download from 08/02/2019 shows normal device function with approximately 9 years of battery longevity, 79% atrial pacing and 100% ventricular pacing, no episodes of high ventricular rates.  There has been no meaningful atrial fibrillation since February 2020  The patient does not have symptoms concerning for COVID-19 infection (fever, chills, cough, or new shortness of breath).    Past Medical History:  Diagnosis Date  . Acute cystitis without hematuria 12/22/2015  . Anemia   . Arthritis   . CAP (community acquired pneumonia) 02/08/2016  . CAP (community acquired pneumonia) 02/08/2016  . Cataracts, bilateral    immature  . Chronic cholecystitis with calculus 01/21/2015   S/p cholecystectomy   . Chronic diastolic heart failure ([place]) 09/19/2014  .  Chronic insomnia   . Constipation    takes Miralax daily as needed  . COPD (chronic obstructive pulmonary disease) (HCC)    Albuterol inhaler daily as needed;Duoneb daily as needed;Spiriva daily  . DDD (degenerative disc disease)     cervical - kyphosis with mod DD changes C5/6 and C6/7; lumbar - early DD at L2/3 ([place])  . Dyspnea    with exertion  . Essential hypertension    was on meds but after appointment Dec 11 with Dr.C he took her off  . GERD (gastroesophageal reflux disease)    takes Protonix daily  . History of pneumonia 02/16/2016  . History of radiation therapy 05/30/11 to 07/07/11   rectum  . History of shingles   . Hyperlipemia    takes Lovastatin daily  . Legally blind in left eye, as defined in [place]   . Lung nodule    RLL nodule-6mm stable 2006, April 2009, and June 2009  . Obesity   . Osteopenia 12/2014   T -1.5 hip  . Pancreatitis 11/2014   ?zpack related vs gallstone pancreatitis with abnormal HIDA scan pending cholecystectomy  . PONV (postoperative nausea and vomiting)   . Presence of permanent cardiac pacemaker   . Rectal cancer ([place]) 04/2011   T3N0; s/p lap LAR, s/p ileostomy, s/p reversal, s/p chemo  . Research study patient 08/26/2016  . Rheumatic heart disease mitral stenosis    mod MS by echo 02/2014  . S/P AVR (aortic valve replacement) 2015   bioprosthetic ([place])  . S/P MVR (mitral valve replacement) 2015   bioprosthetic ([place])  . Seizures ([place])    febrile seizures as a child  . Sepsis secondary to UTI ([place]) 08/22/2017  . Severe aortic valve stenosis  AVR 3/15 10/01/2013   mild-mod by echo 02/2014  . Sleep apnea    PT TOLD BORDERLINE-TRIED CPAP-DID NOT HELP-SHE DOES NOT USE CPAP  . Small bowel obstruction, partial ([place]) 08/2013   reolved without NGT placement.   . Vitamin B12 deficiency 02/28/2015   Start B12 shots 02/2015    Past Surgical History:  Procedure Laterality Date  . ANTERIOR LAT LUMBAR FUSION Left 07/17/2018   Procedure: Lumbar Two-Three Lumbar Three-Four Anterolateral decompression/interbody fusion with lateral plate fixation/Infuse;  Surgeon: Kristeen Miss, MD;  Location: [facility];  Service: Neurosurgery;  Laterality: Left;  Lumbar Two-Three Lumbar Three-Four  Anterolateral decompression/interbody fusion with lateral plate fixation/Infuse  . AORTIC VALVE REPLACEMENT N/A 12/12/2013   Procedure: AORTIC VALVE REPLACEMENT (AVR);  Surgeon: Gaye Pollack, MD; Service: Open Heart Surgery  . BACK SURGERY  2006, 2007   2006 SPACER, 2007 decompression and fusion L4/5  . BOWEL RESECTION  04/02/2012   Procedure: SMALL BOWEL RESECTION;  Surgeon: Stark Klein, MD;  Location: [facility];  Service: General;  Laterality: N/A;  . CARPAL TUNNEL RELEASE Bilateral   . CATARACT EXTRACTION W/ INTRAOCULAR LENS IMPLANT Left 10/18/2016  . CATARACT EXTRACTION W/ INTRAOCULAR LENS IMPLANT Right 12/13/2016   Dr. Kathrin Penner  . CHOLECYSTECTOMY N/A 01/21/2015   chronic cholecystitis, Stark Klein, MD  . COLON RESECTION  08/25/2011   Procedure: COLON RESECTION LAPAROSCOPIC;  Surgeon: Stark Klein, MD;  Location: [facility];  Service: General;  Laterality: N/A;  Laparoscopic Assisted Low Anterior Resection Diverting Ostomy and onQ pain pump  . COLONOSCOPY  03/2013   1 polyp, rpt 3 yrs Ardis Hughs)  . COLONOSCOPY WITH PROPOFOL N/A 06/09/2016   patent colo-colonic anastomosis, rpt 5 yrs Ardis Hughs)  . EP IMPLANTABLE DEVICE N/A 06/16/2016   Procedure:  Pacemaker Implant;  Surgeon: Will Meredith Leeds, MD;  Location: [facility];  Service: Cardiovascular;  Laterality: N/A;  . FOOT SURGERY  left foot   hammer toe  . HAND SURGERY  Bil   carpal tunnel  . ILEOSTOMY  08/25/2011  . ILEOSTOMY CLOSURE  04/02/2012   Procedure: ILEOSTOMY TAKEDOWN;  Surgeon: Stark Klein, MD;  [facility];  Service: General;  Laterality: N/A;  . INTRAOPERATIVE TRANSESOPHAGEAL ECHOCARDIOGRAM N/A 12/12/2013   Procedure: INTRAOPERATIVE TRANSESOPHAGEAL ECHOCARDIOGRAM;  Surgeon: Gaye Pollack, MD;  Location: [facility];  Service: Open Heart Surgery;  Laterality: N/A;  . LEFT AND RIGHT HEART CATHETERIZATION WITH CORONARY ANGIOGRAM N/A 11/27/2013   Procedure: LEFT AND RIGHT HEART CATHETERIZATION WITH CORONARY ANGIOGRAM;  Surgeon:  Sanda Klein, MD;  Location: [facility];  Service: Cardiovascular;  Laterality: N/A;  . MITRAL VALVE REPLACEMENT N/A 12/12/2013   Procedure: MITRAL VALVE (MV) REPLACEMENT OR REPAIR;  Surgeon: Gaye Pollack, MD; Service: Open Heart Surgery  . TEE WITHOUT CARDIOVERSION N/A 11/29/2013   Procedure: TRANSESOPHAGEAL ECHOCARDIOGRAM (TEE);  Surgeon: Sueanne Margarita, MD;  Location: [facility];  Service: Cardiovascular;  Laterality: N/A;  . TEE WITHOUT CARDIOVERSION N/A 06/04/2019   Procedure: TRANSESOPHAGEAL ECHOCARDIOGRAM (TEE);  Surgeon: Sanda Klein, MD;  Location: [facility];  Service: Cardiovascular;  Laterality: N/A;  . TONSILLECTOMY       Current Meds  Medication Sig  . acetaminophen (TYLENOL) 500 MG tablet Take 2 tablets (1,000 mg total) by mouth daily.  Marland Kitchen albuterol (VENTOLIN HFA) 108 (90 Base) MCG/ACT inhaler Inhale 2 puffs into the lungs every 6 (six) hours as needed for wheezing.  Marland Kitchen amoxicillin (AMOXIL) 500 MG capsule TAKE 4 CAPSULES BY MOUTH 30-60 MINUTES PRIOR TO APPOINTMENT  . Biotin 1000 MCG tablet Take 1,000 mcg by mouth daily.  Marland Kitchen BREO ELLIPTA 200-25 MCG/INH AEPB INHALE 1 PUFF INTO THE LUNGS EVERY DAY  . Calcium Carb-Cholecalciferol (CALCIUM 600+D) 600-800 MG-UNIT TABS Take 1 tablet by mouth every evening.  . Cholecalciferol (VITAMIN D) 2000 UNITS CAPS Take 2,000 Units by mouth every evening.   . docusate sodium (COLACE) 100 MG capsule Take 200 mg by mouth every evening.  . furosemide (LASIX) 40 MG tablet Take 1 tablet (40 mg total) by mouth daily.  Marland Kitchen HYDROcodone-acetaminophen (NORCO/VICODIN) 5-325 MG tablet Take 1 tablet by mouth every 4 (four) hours as needed for severe pain ((score 7 to 10)).  Marland Kitchen ipratropium-albuterol (DUONEB) 0.5-2.5 (3) MG/3ML SOLN Take 3 mLs by nebulization every 6 (six) hours as needed. (Patient taking differently: Take 3 mLs by nebulization every 6 (six) hours as needed (asthma). )  . lovastatin (MEVACOR) 40 MG tablet TAKE 1 TABLET(40 MG) BY MOUTH AT  BEDTIME  . montelukast (SINGULAIR) 10 MG tablet TAKE 1 TABLET(10 MG) BY MOUTH AT BEDTIME  . Multiple Vitamins-Minerals (EMERGEN-C IMMUNE PO) Take 1 packet by mouth daily.  . Multiple Vitamins-Minerals (PRESERVISION AREDS 2 PO) Take 2 capsules by mouth every evening.   . nitrofurantoin, macrocrystal-monohydrate, (MACROBID) 100 MG capsule Take 100 mg by mouth every evening.   . polycarbophil (FIBERCON) 625 MG tablet Take 625 mg by mouth every evening.   . polyethylene glycol (MIRALAX / GLYCOLAX) packet Take 17 g by mouth daily.  . potassium chloride (K-DUR) 10 MEQ tablet Take 2 tablets (20 mEq total) by mouth daily.  . Talc (ZEASORB EX) Apply 1 application topically daily as needed (yeast infection under stomach). Under skin folds (abdomen)  . tiotropium (SPIRIVA HANDIHALER) 18 MCG inhalation capsule INHALE THE  CONTENTS OF ONE CAPSULE IN THE HANDIHALER ONCE DAILY  . TOVIAZ 8 MG TB24 tablet Take 8 mg by mouth every evening.   . traMADol (ULTRAM) 50 MG tablet Take 0.5-1 tablets (25-50 mg total) by mouth 2 (two) times daily as needed for moderate pain.  . vitamin B-12 (CYANOCOBALAMIN) 500 MCG tablet Take 500 mcg by mouth every other day.   . warfarin (COUMADIN) 5 MG tablet Take 5mg  daily or as directed by coumadin clinic     Allergies:   Codeine, Prednisone, Sulfa antibiotics, and Sulfonamide derivatives   Social History   Tobacco Use  . Smoking status: Former Smoker    Packs/day: 1.50    Years: 10.00    Pack years: 15.00    Types: Cigarettes    Quit date: 12/28/1974    Years since quitting: 44.7  . Smokeless tobacco: Never Used  . Tobacco comment: quit smoking in 1976  Substance Use Topics  . Alcohol use: Not Currently    Alcohol/week: 2.0 standard drinks    Types: 2 Glasses of wine per week    Comment: very rare  . Drug use: No     Family Hx: The patient's family history includes Breast cancer (age of onset: 84) in her maternal aunt; CAD (age of onset: 7) in her son; Diabetes in  her mother; Heart attack in her maternal grandfather; Heart attack (age of onset: 52) in her son; Heart disease in her mother; Hypertension in her mother; Lung cancer in her father; Stroke in her mother. There is no history of Colon cancer, Esophageal cancer, Stomach cancer, or Rectal cancer.  ROS:   Please see the history of present illness.     All other systems reviewed and are negative.   Prior CV studies:   The following studies were reviewed today:  Echocardiogram September 02, 2019  1. Left ventricular ejection fraction, by visual estimation, is 60 to 65%. The left ventricle has normal function. Left ventricular septal wall thickness was mildly increased. Mildly increased left ventricular posterior wall thickness. There is mildly  increased left ventricular hypertrophy.  2. Intraventricular gradient noted. Peak velocity 3.08 m/s. Peak gradient 38 mmHg.  3. Global right ventricle has normal systolic function.The right ventricular size is normal. No increase in right ventricular wall thickness.  4. Left atrial size was normal.  5. Right atrial size was normal.  6. The mitral valve has been repaired/replaced. No evidence of mitral valve regurgitation. Moderate mitral stenosis.  7. 25 mm Edwards MagnaEase bioprosthetic mitral valve present. Moderate mitral stenosis. Mean gradient 9 mmHg compared wtih 11 mmHg 05/2019. Heart rate 71 bpm.  8. The tricuspid valve is normal in structure. Tricuspid valve regurgitation is mild.  9. Aortic valve mean gradient measures 12.0 mmHg. 10. Aortic valve peak gradient measures 20.9 mmHg. 11. Aortic valve regurgitation is not visualized. Mild aortic valve stenosis. 12. Mean aortic valve gradient unchanged from 05/2019 (11 mmHg). 13. The pulmonic valve was normal in structure. Pulmonic valve regurgitation is not visualized. 14. Mildly elevated pulmonary artery systolic pressure. 15. The inferior vena cava is normal in size with <50% respiratory variability,  suggesting right atrial pressure of 8 mmHg.  In comparison to the previous echocardiogram(s): 05/14/19 EF 55-60%. AV 19mmHg mean, 36mmHg peak. MV 44mmHg mean, 34mmHg peak.  Labs/Other Tests and Data Reviewed:    EKG:  An ECG dated 04/16/2019 was personally reviewed today and demonstrated:  Atrioventricular sequential pacing with positive R waves in lead V1  Recent Labs: 04/23/2019: TSH  2.24 05/31/2019: Potassium 4.4; Sodium 142 07/22/2019: ALT 124; Hemoglobin 14.2; Platelets 154.0 08/05/2019: BUN 23; Creatinine, Ser 0.76   Recent Lipid Panel Lab Results  Component Value Date/Time   CHOL 135 04/23/2019 07:48 AM   TRIG 47.0 04/23/2019 07:48 AM   HDL 56.60 04/23/2019 07:48 AM   CHOLHDL 2 04/23/2019 07:48 AM   LDLCALC 69 04/23/2019 07:48 AM   LDLDIRECT 111.0 06/26/2015 09:28 AM   CT chest from 04/22/2019 shows "waxing/waning peribronchovascular nodularity in the lungs bilaterally, suggesting a combination of postinfectious inflammatory scarring and atypical mycobacterial infection rather than metastatic disease".  There was evidence of three-vessel coronary and aortic atherosclerosis.  Tiny right pleural effusion, unchanged. On my review, highly suspicious that the tip of the right ventricular pacing lead is in the pericardial space.  This would explain the positive R waves in leads V1.   Wt Readings from Last 3 Encounters:  09/09/19 135 lb 9.6 oz (61.5 kg)  06/18/19 138 lb 4 oz (62.7 kg)  05/15/19 137 lb (62.1 kg)     Objective:    Vital Signs:  BP 125/66   Pulse 66   Ht 5\' 6"  (1.676 m)   Wt 135 lb 9.6 oz (61.5 kg)   BMI 21.89 kg/m     VITAL SIGNS:  reviewed Unable to examine  ASSESSMENT & PLAN:    1. CHF: His functional status due to his cervical spine disease.  No overt symptoms of heart failure. 2.  Prosthetic MV stenosis: She has evidence of limited mobility of one of her prosthetic leaflets due to thrombus on TEE in August.  Improving gradients after switching to  warfarin.  In fact, pressure half-time appears to be back to baseline and residual increase in mean gradient might be related to the hyperdynamic left ventricular function. 3. S/p AVR: Aortic valve biological prosthesis is functioning normally. 4. 2nd deg AV block: She has 100% ventricular pacing despite attempts to extend AV delay and programming with VIP. 5. Atrial flutter: Had successful overdrive pacing of persistent atrial flutter in March 2020.  No recurrent arrhythmia since that time.   History of syncope on higher doses of bisoprolol related to orthostatic hypotension.  Tolerating metoprolol well.  On anticoagulation.   6.  COPD: Limited access to pulmonary rehab with the coronavirus pandemic.  Overall she has done very well.  Note findings on recent CT of the chest. 7.  HLP: Remarkable improvement after her weight loss.  LDL at target.  COVID-19 Education: The signs and symptoms of COVID-19 were discussed with the patient and how to seek care for testing (follow up with PCP or arrange E-visit).  The importance of social distancing was discussed today.  Time:   Today, I have spent 20 minutes with the patient with telehealth technology discussing the above problems.     Medication Adjustments/Labs and Tests Ordered: Current medicines are reviewed at length with the patient today.  Concerns regarding medicines are outlined above.   Tests Ordered: No orders of the defined types were placed in this encounter.   Medication Changes: No orders of the defined types were placed in this encounter.  Patient Instructions  Medication Instructions:  No changes *If you need a refill on your cardiac medications before your next appointment, please call your pharmacy*  Lab Work: None ordered If you have labs (blood work) drawn today and your tests are completely normal, you will receive your results only by: Marland Kitchen MyChart Message (if you have MyChart) OR . A paper  copy in the mail If you have  any lab test that is abnormal or we need to change your treatment, we will call you to review the results.  Testing/Procedures: Your physician has requested that you have an echocardiogram in 6 months. Echocardiography is a painless test that uses sound waves to create images of your heart. It provides your doctor with information about the size and shape of your heart and how well your heart's chambers and valves are working. You may receive an ultrasound enhancing agent through an IV if needed to better visualize your heart during the echo.This procedure takes approximately one hour. There are no restrictions for this procedure. This will take place at the [address].    Follow-Up: At [facility], you and your health needs are our priority.  As part of our continuing mission to provide you with exceptional heart care, we have created designated Provider Care Teams.  These Care Teams include your primary Cardiologist (physician) and Advanced Practice Providers (APPs -  Physician Assistants and Nurse Practitioners) who all work together to provide you with the care you need, when you need it.  Your next appointment:   6 month(s) after the ECHO  The format for your next appointment:   In Person  Provider:   Sanda Klein, MD      Follow Up:  ECHO scheduled for November, with subsequent office follow up  Signed, Sanda Klein, MD  09/09/2019 3:55 PM    [place]

## 2019-09-09 NOTE — Patient Instructions (Addendum)
Medication Instructions:  No changes *If you need a refill on your cardiac medications before your next appointment, please call your pharmacy*  Lab Work: None ordered If you have labs (blood work) drawn today and your tests are completely normal, you will receive your results only by: Marland Kitchen MyChart Message (if you have MyChart) OR . A paper copy in the mail If you have any lab test that is abnormal or we need to change your treatment, we will call you to review the results.  Testing/Procedures: Your physician has requested that you have an echocardiogram in 6 months. Echocardiography is a painless test that uses sound waves to create images of your heart. It provides your doctor with information about the size and shape of your heart and how well your heart's chambers and valves are working. You may receive an ultrasound enhancing agent through an IV if needed to better visualize your heart during the echo.This procedure takes approximately one hour. There are no restrictions for this procedure. This will take place at the [address], [address].    Follow-Up: At [facility], you and your health needs are our priority.  As part of our continuing mission to provide you with exceptional heart care, we have created designated Provider Care Teams.  These Care Teams include your primary Cardiologist (physician) and Advanced Practice Providers (APPs -  Physician Assistants and Nurse Practitioners) who all work together to provide you with the care you need, when you need it.  Your next appointment:   6 month(s) after the ECHO  The format for your next appointment:   In Person  Provider:   Sanda Klein, MD

## 2019-09-12 ENCOUNTER — Ambulatory Visit (INDEPENDENT_AMBULATORY_CARE_PROVIDER_SITE_OTHER): Payer: Medicare Other | Admitting: General Practice

## 2019-09-12 ENCOUNTER — Other Ambulatory Visit: Payer: Self-pay

## 2019-09-12 DIAGNOSIS — I484 Atypical atrial flutter: Secondary | ICD-10-CM

## 2019-09-12 DIAGNOSIS — Z7901 Long term (current) use of anticoagulants: Secondary | ICD-10-CM

## 2019-09-12 LAB — POCT INR: INR: 2.3 (ref 2.0–3.0)

## 2019-09-12 NOTE — Patient Instructions (Addendum)
Pre visit review using our clinic review tool, if applicable. No additional management support is needed unless otherwise documented below in the visit note.  Continue to take 1 tablet daily.  Re-check on 4 weeks.

## 2019-09-27 ENCOUNTER — Encounter: Payer: Self-pay | Admitting: Family Medicine

## 2019-10-08 ENCOUNTER — Other Ambulatory Visit: Payer: Self-pay | Admitting: Family Medicine

## 2019-10-14 ENCOUNTER — Other Ambulatory Visit: Payer: Self-pay | Admitting: Family Medicine

## 2019-10-14 DIAGNOSIS — Z1231 Encounter for screening mammogram for malignant neoplasm of breast: Secondary | ICD-10-CM

## 2019-10-17 ENCOUNTER — Ambulatory Visit (INDEPENDENT_AMBULATORY_CARE_PROVIDER_SITE_OTHER): Payer: Medicare Other | Admitting: General Practice

## 2019-10-17 ENCOUNTER — Other Ambulatory Visit: Payer: Self-pay

## 2019-10-17 DIAGNOSIS — I484 Atypical atrial flutter: Secondary | ICD-10-CM

## 2019-10-17 DIAGNOSIS — Z7901 Long term (current) use of anticoagulants: Secondary | ICD-10-CM | POA: Diagnosis not present

## 2019-10-17 LAB — POCT INR: INR: 1.6 — AB (ref 2.0–3.0)

## 2019-10-17 NOTE — Patient Instructions (Addendum)
Pre visit review using our clinic review tool, if applicable. No additional management support is needed unless otherwise documented below in the visit note.  Take 1 1/2 tablets today and tomorrow and then continue to take 1 tablet daily.  Re-check on 3 weeks.

## 2019-10-21 NOTE — Progress Notes (Signed)
Noted  

## 2019-10-27 ENCOUNTER — Other Ambulatory Visit: Payer: Self-pay | Admitting: Pulmonary Disease

## 2019-10-27 ENCOUNTER — Other Ambulatory Visit: Payer: Self-pay | Admitting: Family Medicine

## 2019-10-28 ENCOUNTER — Telehealth: Payer: Self-pay

## 2019-10-28 DIAGNOSIS — R7401 Elevation of levels of liver transaminase levels: Secondary | ICD-10-CM

## 2019-10-28 NOTE — Telephone Encounter (Signed)
Pt called to cancel coumadin clinic apt on [id number] due to conflict with another apt. Pt reports she is seeing PCP on 1/25 and requests INR checked then. Advised this could be done. Pt appreciative.  Pt also wanted to know if she needed labs before her apt on 1/25. Advised a msg would be sent to PCP and this office would f/u if labs are needed.  Placed a reminder for pt apt on 1/25

## 2019-10-28 NOTE — Telephone Encounter (Signed)
Labs ordered. Ok to come in this week.  Doesn't need to be fasting.

## 2019-10-30 NOTE — Telephone Encounter (Signed)
Left message on vm per dpr asking pt to call back to schedule lab visit.

## 2019-10-31 ENCOUNTER — Other Ambulatory Visit (INDEPENDENT_AMBULATORY_CARE_PROVIDER_SITE_OTHER): Payer: Medicare Other

## 2019-10-31 DIAGNOSIS — R7401 Elevation of levels of liver transaminase levels: Secondary | ICD-10-CM

## 2019-10-31 LAB — COMPREHENSIVE METABOLIC PANEL
ALT: 21 U/L (ref 0–35)
AST: 30 U/L (ref 0–37)
Albumin: 3.8 g/dL (ref 3.5–5.2)
Alkaline Phosphatase: 64 U/L (ref 39–117)
BUN: 25 mg/dL — ABNORMAL HIGH (ref 6–23)
CO2: 32 mEq/L (ref 19–32)
Calcium: 9.2 mg/dL (ref 8.4–10.5)
Chloride: 102 mEq/L (ref 96–112)
Creatinine, Ser: 0.74 mg/dL (ref 0.40–1.20)
GFR: 76.65 mL/min (ref >60.00)
Glucose, Bld: 86 mg/dL (ref 70–99)
Potassium: 3.8 mEq/L (ref 3.5–5.1)
Sodium: 139 mEq/L (ref 135–145)
Total Bilirubin: 0.6 mg/dL (ref 0.2–1.2)
Total Protein: 6.8 g/dL (ref 6.0–8.3)

## 2019-10-31 LAB — CBC WITH DIFFERENTIAL/PLATELET
Basophils Absolute: 0.1 10*3/uL (ref 0.0–0.1)
Basophils Relative: 1.1 % (ref 0.0–3.0)
Eosinophils Absolute: 0.1 10*3/uL (ref 0.0–0.7)
Eosinophils Relative: 2.7 % (ref 0.0–5.0)
HCT: 42.7 % (ref 36.0–46.0)
Hemoglobin: 14.1 g/dL (ref 12.0–15.0)
Lymphocytes Relative: 14.1 % (ref 12.0–46.0)
Lymphs Abs: 0.7 10*3/uL (ref 0.7–4.0)
MCHC: 33 g/dL (ref 30.0–36.0)
MCV: 101.8 fl — ABNORMAL HIGH (ref 78.0–100.0)
Monocytes Absolute: 0.6 10*3/uL (ref 0.1–1.0)
Monocytes Relative: 11.9 % (ref 3.0–12.0)
Neutro Abs: 3.7 10*3/uL (ref 1.4–7.7)
Neutrophils Relative %: 70.2 % (ref 43.0–77.0)
Platelets: 111 10*3/uL — ABNORMAL LOW (ref 150.0–400.0)
RBC: 4.2 Mil/uL (ref 3.87–5.11)
RDW: 13.6 % (ref 11.5–15.5)
WBC: 5.3 10*3/uL (ref 4.0–10.5)

## 2019-10-31 NOTE — Telephone Encounter (Signed)
Pt had labs done today.

## 2019-11-01 ENCOUNTER — Ambulatory Visit (INDEPENDENT_AMBULATORY_CARE_PROVIDER_SITE_OTHER): Payer: Medicare Other | Admitting: *Deleted

## 2019-11-01 DIAGNOSIS — R55 Syncope and collapse: Secondary | ICD-10-CM

## 2019-11-01 LAB — CUP PACEART REMOTE DEVICE CHECK
Battery Remaining Longevity: 106 mo
Battery Remaining Percentage: 95.5 %
Battery Voltage: 2.98 V
Brady Statistic AP VP Percent: 78 %
Brady Statistic AP VS Percent: 1 %
Brady Statistic AS VP Percent: 22 %
Brady Statistic AS VS Percent: 1 %
Brady Statistic RA Percent Paced: 78 %
Brady Statistic RV Percent Paced: 99 %
Date Time Interrogation Session: 20210122071121
Implantable Lead Implant Date: 20170907
Implantable Lead Implant Date: 20170907
Implantable Lead Location: 753859
Implantable Lead Location: 753860
Implantable Pulse Generator Implant Date: 20170907
Lead Channel Impedance Value: 410 Ohm
Lead Channel Impedance Value: 530 Ohm
Lead Channel Pacing Threshold Amplitude: 0.5 V
Lead Channel Pacing Threshold Amplitude: 0.75 V
Lead Channel Pacing Threshold Pulse Width: 0.5 ms
Lead Channel Pacing Threshold Pulse Width: 0.5 ms
Lead Channel Sensing Intrinsic Amplitude: 3.1 mV
Lead Channel Sensing Intrinsic Amplitude: 6.9 mV
Lead Channel Setting Pacing Amplitude: 1 V
Lead Channel Setting Pacing Amplitude: 2 V
Lead Channel Setting Pacing Pulse Width: 0.5 ms
Lead Channel Setting Sensing Sensitivity: 2 mV
Pulse Gen Model: 2272
Pulse Gen Serial Number: 7945283

## 2019-11-02 ENCOUNTER — Encounter: Payer: Self-pay | Admitting: Family Medicine

## 2019-11-04 ENCOUNTER — Other Ambulatory Visit: Payer: Self-pay

## 2019-11-04 ENCOUNTER — Encounter: Payer: Self-pay | Admitting: Family Medicine

## 2019-11-04 ENCOUNTER — Ambulatory Visit (INDEPENDENT_AMBULATORY_CARE_PROVIDER_SITE_OTHER): Payer: Medicare Other | Admitting: Family Medicine

## 2019-11-04 ENCOUNTER — Ambulatory Visit: Payer: Self-pay

## 2019-11-04 VITALS — BP 140/70 | HR 83 | Temp 97.6°F | Ht 66.0 in | Wt 138.2 lb

## 2019-11-04 DIAGNOSIS — N182 Chronic kidney disease, stage 2 (mild): Secondary | ICD-10-CM

## 2019-11-04 DIAGNOSIS — Z7901 Long term (current) use of anticoagulants: Secondary | ICD-10-CM

## 2019-11-04 DIAGNOSIS — F5104 Psychophysiologic insomnia: Secondary | ICD-10-CM

## 2019-11-04 DIAGNOSIS — D696 Thrombocytopenia, unspecified: Secondary | ICD-10-CM

## 2019-11-04 DIAGNOSIS — J449 Chronic obstructive pulmonary disease, unspecified: Secondary | ICD-10-CM | POA: Diagnosis not present

## 2019-11-04 DIAGNOSIS — I5032 Chronic diastolic (congestive) heart failure: Secondary | ICD-10-CM

## 2019-11-04 DIAGNOSIS — R7401 Elevation of levels of liver transaminase levels: Secondary | ICD-10-CM

## 2019-11-04 DIAGNOSIS — R413 Other amnesia: Secondary | ICD-10-CM

## 2019-11-04 DIAGNOSIS — Z952 Presence of prosthetic heart valve: Secondary | ICD-10-CM | POA: Diagnosis not present

## 2019-11-04 DIAGNOSIS — I484 Atypical atrial flutter: Secondary | ICD-10-CM

## 2019-11-04 DIAGNOSIS — R918 Other nonspecific abnormal finding of lung field: Secondary | ICD-10-CM

## 2019-11-04 HISTORY — DX: Thrombocytopenia, unspecified: D69.6

## 2019-11-04 LAB — POCT INR: INR: 1.8 — AB (ref 2.0–3.0)

## 2019-11-04 MED ORDER — ZINC 50 MG PO CAPS: 1.0000 | ORAL_CAPSULE | Freq: Every day | ORAL | 0 refills | Status: DC

## 2019-11-04 MED ORDER — WARFARIN SODIUM 5 MG PO TABS: ORAL_TABLET | ORAL | 5 refills | Status: DC

## 2019-11-04 MED ORDER — TRAZODONE HCL 50 MG PO TABS: 25.0000 mg | ORAL_TABLET | Freq: Every evening | ORAL | 3 refills | Status: DC | PRN

## 2019-11-04 NOTE — Assessment & Plan Note (Signed)
Continue coumadin.  

## 2019-11-04 NOTE — Patient Instructions (Signed)
INR today 1.8  Take 1 1/2 tablets today (1/25) and then increase your weekly dose to 5mg  daily (1pill) EXCEPT for take 1 1/2 pills (7.5mg ) on Mondays.  Recheck in 3 weeks.    Other than starting a new daily Zinc supplement, patient denies any changes to diet, health or medications.  She verbalizes understanding of risks associated with subtherapeutic level and will go to ER if any concerns develop.

## 2019-11-04 NOTE — Telephone Encounter (Signed)
Patient needs refill on her warfarin R/x.  She is compliant with coumadin management and follow up with PCP.  Will refill X 6 months.   Last refill for 1 year by cardiology; however, dosing has changed so will d/c that r/x and send in new one to pharmacy. Patient follows [place] now here at the [facility] coumadin clinic and we will take over medication refill management on the warfarin.

## 2019-11-04 NOTE — Telephone Encounter (Signed)
Pt seen in office today.

## 2019-11-04 NOTE — Assessment & Plan Note (Signed)
LFT's have normalized

## 2019-11-04 NOTE — Assessment & Plan Note (Signed)
This has actually improved with weight loss.

## 2019-11-04 NOTE — Assessment & Plan Note (Signed)
Sleep hygiene handout provided. Interested in retrial of trazodone.  Reassess at 1 mo f/u visit.

## 2019-11-04 NOTE — Patient Instructions (Addendum)
We will recheck platelet levels next labwork. Let us know if worsening bruising/bleeding.  Try trazodone 50mg  1/2-1 tablet for sleep.  Return in 4-6 weeks for memory testing.   Sleep hygiene checklist: 1. Avoid naps during the day 2. Avoid stimulants such as caffeine and nicotine. Avoid bedtime alcohol (it can speed onset of sleep but the body's metabolism can cause awakenings). 3. All forms of exercise help ensure sound sleep - limit vigorous exercise to morning or late afternoon 4. Avoid food too close to bedtime including chocolate (which contains caffeine) 5. Soak up natural light 6. Establish regular bedtime routine. 7. Associate bed with sleep - avoid TV, computer or phone, reading while in bed. 8. Ensure pleasant, relaxing sleep environment - quiet, dark, cool room.

## 2019-11-04 NOTE — Assessment & Plan Note (Signed)
I asked her to return in 1 month for formal geriatric assessment.

## 2019-11-04 NOTE — Assessment & Plan Note (Signed)
Appreciate pulm care. Stable period on current respiratory regimen. Encouraged f/u.

## 2019-11-04 NOTE — Assessment & Plan Note (Signed)
Update next labwork with peripheral smear.

## 2019-11-04 NOTE — Assessment & Plan Note (Signed)
Appreciate pulm care - encouraged f/u.

## 2019-11-04 NOTE — Progress Notes (Signed)
This visit was conducted in person.  BP 140/70 (BP Location: Left Arm, Patient Position: Sitting, Cuff Size: Normal)   Pulse 83   Temp 97.6 F (36.4 C) (Temporal)   Ht 5\' 6"  (1.676 m)   Wt 138 lb 3 oz (62.7 kg)   SpO2 96%   BMI 22.30 kg/m    CC: 6 mo f/u visit Subjective:    Patient ID: [name], female    DOB: Jul 04, 1946, 74 y.o.   MRN: [id number]  HPI: [name] is a 74 y.o. female presenting on 11/04/2019 for Follow-up (Here for 6 mo f/u.)   SIL's daughter diagnosed with covid this week. [name] was around SIL [name]) last week. Patient asks about testing.   [name] did have first vaccine last week.   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 Planned f/u with Dr [name] later this year.   COPD - stable respiratory status on spiriva and breo and singulair.   Thrombocytopenia - intermittently noted over the years.   Intermittent R leg swelling.  Noted night sweats about once a week. No fevers/chills.  She did complete PT course through Dr Ellene Route for imbalance with benefit.   Noticing memory loss - notes she doesn't remember periods of time. Sometimes notes trouble completing thoughts when speaking.   Trouble sleeping - longstanding. Up since 1:45am this morning, bedtime is 11:30pm. Melatonin ineffective. Previously tried trazodone.      Relevant past medical, surgical, family and social history reviewed and updated as indicated. Interim medical history since our last visit reviewed. Allergies and medications reviewed and updated. Outpatient Medications Prior to Visit  Medication Sig Dispense Refill  . acetaminophen (TYLENOL) 500 MG tablet Take 2 tablets (1,000 mg total) by mouth daily.    Marland Kitchen albuterol (VENTOLIN HFA) 108 (90 Base) MCG/ACT inhaler Inhale 2 puffs into the lungs every 6 (six) hours as needed for  wheezing. 18 g 6  . amoxicillin (AMOXIL) 500 MG capsule TAKE 4 CAPSULES BY MOUTH 30-60 MINUTES PRIOR TO APPOINTMENT 4 capsule 12  . Biotin 1000 MCG tablet Take 1,000 mcg by mouth daily.    Marland Kitchen BREO ELLIPTA 200-25 MCG/INH AEPB INHALE 1 PUFF INTO THE LUNGS EVERY DAY 60 each 3  . Calcium Carb-Cholecalciferol (CALCIUM 600+D) 600-800 MG-UNIT TABS Take 1 tablet by mouth every evening.    . Cholecalciferol (VITAMIN D) 2000 UNITS CAPS Take 2,000 Units by mouth every evening.     . docusate sodium (COLACE) 100 MG capsule Take 200 mg by mouth every evening.    . furosemide (LASIX) 40 MG tablet Take 1 tablet (40 mg total) by mouth daily. 30 tablet 11  . HYDROcodone-acetaminophen (NORCO/VICODIN) 5-325 MG tablet Take 1 tablet by mouth every 4 (four) hours as needed for severe pain ((score 7 to 10)). 40 tablet 0  . ipratropium-albuterol (DUONEB) 0.5-2.5 (3) MG/3ML SOLN Take 3 mLs by nebulization every 6 (six) hours as needed. (Patient taking differently: Take 3 mLs by nebulization every 6 (six) hours as needed (asthma). ) 360 mL 0  . lovastatin (MEVACOR) 40 MG tablet TAKE 1 TABLET(40 MG) BY MOUTH AT BEDTIME 90 tablet 3  . montelukast (SINGULAIR) 10 MG tablet TAKE 1 TABLET(10 MG) BY MOUTH AT BEDTIME 30 tablet 3  . Multiple Vitamins-Minerals (EMERGEN-C IMMUNE PO) Take 1 packet by mouth daily.    . Multiple Vitamins-Minerals (PRESERVISION AREDS 2  PO) Take 2 capsules by mouth every evening.     . nitrofurantoin, macrocrystal-monohydrate, (MACROBID) 100 MG capsule Take 100 mg by mouth every evening.     . polycarbophil (FIBERCON) 625 MG tablet Take 625 mg by mouth every evening.     . polyethylene glycol (MIRALAX / GLYCOLAX) packet Take 17 g by mouth daily. 14 each 0  . potassium chloride (K-DUR) 10 MEQ tablet Take 2 tablets (20 mEq total) by mouth daily. 60 tablet 11  . Talc (ZEASORB EX) Apply 1 application topically daily as needed (yeast infection under stomach). Under skin folds (abdomen)    . tiotropium (SPIRIVA  HANDIHALER) 18 MCG inhalation capsule INHALE THE CONTENTS OF ONE CAPSULE BY MOUTH EVERY DAY 30 capsule 0  . TOVIAZ 8 MG TB24 tablet Take 8 mg by mouth every evening.   11  . traMADol (ULTRAM) 50 MG tablet Take 0.5-1 tablets (25-50 mg total) by mouth 2 (two) times daily as needed for moderate pain. 30 tablet 0  . vitamin B-12 (CYANOCOBALAMIN) 500 MCG tablet Take 500 mcg by mouth every other day.     . warfarin (COUMADIN) 5 MG tablet TAKE 1 TABLET BY MOUTH DAILY AS DIRECTED BY COUMADIN CLINIC 30 tablet 11  . nitrofurantoin (MACRODANTIN) 100 MG capsule Take 100 mg by mouth daily.     Facility-Administered Medications Prior to Visit  Medication Dose Route Frequency Provider Last Rate Last Admin  . ondansetron (ZOFRAN) 4 mg in sodium chloride 0.9 % 50 mL IVPB  4 mg Intravenous Q6H PRN Diane Miss, MD         Per HPI unless specifically indicated in ROS section below Review of Systems Objective:    BP 140/70 (BP Location: Left Arm, Patient Position: Sitting, Cuff Size: Normal)   Pulse 83   Temp 97.6 F (36.4 C) (Temporal)   Ht 5\' 6"  (1.676 m)   Wt 138 lb 3 oz (62.7 kg)   SpO2 96%   BMI 22.30 kg/m   Wt Readings from Last 3 Encounters:  11/04/19 138 lb 3 oz (62.7 kg)  09/09/19 135 lb 9.6 oz (61.5 kg)  06/18/19 138 lb 4 oz (62.7 kg)    Physical Exam Vitals and nursing note reviewed.  Constitutional:      Appearance: Normal appearance. She is not ill-appearing.  Eyes:     Extraocular Movements: Extraocular movements intact.     Pupils: Pupils are equal, round, and reactive to light.  Neck:     Vascular: No carotid bruit.  Cardiovascular:     Rate and Rhythm: Normal rate and regular rhythm.     Pulses: Normal pulses.     Heart sounds: Murmur (3/6 mid systolic best at USB) present.  Pulmonary:     Effort: Pulmonary effort is normal. No respiratory distress.     Breath sounds: No wheezing, rhonchi or rales.     Comments: Coarse throughout Musculoskeletal:     Right lower leg:  Edema (tr) present.     Left lower leg: No edema.  Skin:    Findings: No rash.  Neurological:     Mental Status: She is alert.  Psychiatric:        Mood and Affect: Mood normal.        Behavior: Behavior normal.       Lab Results  Component Value Date   CREATININE 0.74 10/31/2019   BUN 25 (H) 10/31/2019   NA 139 10/31/2019   K 3.8 10/31/2019   CL 102 10/31/2019  CO2 32 10/31/2019    Lab Results  Component Value Date   WBC 5.3 10/31/2019   HGB 14.1 10/31/2019   HCT 42.7 10/31/2019   MCV 101.8 (H) 10/31/2019   PLT 111.0 (L) 10/31/2019    Lab Results  Component Value Date   ALT 21 10/31/2019   AST 30 10/31/2019   ALKPHOS 64 10/31/2019   BILITOT 0.6 10/31/2019    Assessment & Plan:  This visit occurred during the SARS-CoV-2 public health emergency.  Safety protocols were in place, including screening questions prior to the visit, additional usage of staff PPE, and extensive cleaning of exam room while observing appropriate contact time as indicated for disinfecting solutions.   Covid concerns reviewed, questions answered.  Problem List Items Addressed This Visit    Transaminitis    LFTs have normalized      Thrombocytopenia ([place]) - Primary    Update next labwork with peripheral smear.       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      Memory loss    I asked her to return in 1 month for formal geriatric assessment.       Long term (current) use of anticoagulants   COPD (chronic obstructive pulmonary disease) (HCC)    Appreciate pulm care. Stable period on current respiratory regimen. Encouraged f/u.       CKD (chronic kidney disease) stage 2, GFR 60-89 ml/min    This has actually improved with weight loss.       Chronic insomnia    Sleep hygiene handout provided. Interested in retrial of trazodone.  Reassess at 1 mo f/u visit.       Chronic diastolic heart failure (HCC)   Abnormal findings on diagnostic imaging of  lung    Appreciate pulm care - encouraged f/u.           Meds ordered this encounter  Medications  . Zinc 50 MG CAPS    Sig: Take 1 capsule (50 mg total) by mouth daily.    Refill:  0  . traZODone (DESYREL) 50 MG tablet    Sig: Take 0.5-1 tablets (25-50 mg total) by mouth at bedtime as needed for sleep.    Dispense:  30 tablet    Refill:  3   No orders of the defined types were placed in this encounter.   Patient Instructions  We will recheck platelet levels next labwork. Let us know if worsening bruising/bleeding.  Try trazodone 50mg  1/2-1 tablet for sleep.  Return in 4-6 weeks for memory testing.   Sleep hygiene checklist: 1. Avoid naps during the day 2. Avoid stimulants such as caffeine and nicotine. Avoid bedtime alcohol (it can speed onset of sleep but the body's metabolism can cause awakenings). 3. All forms of exercise help ensure sound sleep - limit vigorous exercise to morning or late afternoon 4. Avoid food too close to bedtime including chocolate (which contains caffeine) 5. Soak up natural light 6. Establish regular bedtime routine. 7. Associate bed with sleep - avoid TV, computer or phone, reading while in bed. 8. Ensure pleasant, relaxing sleep environment - quiet, dark, cool room.    Follow up plan: Return in about 4 weeks (around 12/02/2019) for follow up visit.  Ria Bush, MD

## 2019-11-07 ENCOUNTER — Ambulatory Visit: Payer: Medicare Other

## 2019-11-22 ENCOUNTER — Telehealth: Payer: Self-pay | Admitting: Cardiovascular Disease

## 2019-11-22 NOTE — Telephone Encounter (Signed)
From my point of view that's fine.

## 2019-11-22 NOTE — Telephone Encounter (Signed)
Patient is calling wanting to clarify with Dr. Sallyanne Kuster that it is okay for her to stop seeing Dr. Curt Bears as previously discussed and just continue care with him. Please advise.

## 2019-11-25 ENCOUNTER — Other Ambulatory Visit: Payer: Self-pay | Admitting: Pulmonary Disease

## 2019-11-25 NOTE — Telephone Encounter (Signed)
Pt informed of providers recommendations. Pt verbalized understanding. She will call back next month to schedule and will call in may to schedule July 2021 appt

## 2019-11-26 ENCOUNTER — Encounter: Payer: Self-pay | Admitting: Family Medicine

## 2019-11-26 ENCOUNTER — Other Ambulatory Visit: Payer: Self-pay

## 2019-11-26 ENCOUNTER — Ambulatory Visit (INDEPENDENT_AMBULATORY_CARE_PROVIDER_SITE_OTHER)
Admission: RE | Admit: 2019-11-26 | Discharge: 2019-11-26 | Disposition: A | Discharge status: Home / Self Care | Payer: Medicare Other | Source: Ambulatory Visit | Attending: Family Medicine | Admitting: Family Medicine

## 2019-11-26 ENCOUNTER — Ambulatory Visit (INDEPENDENT_AMBULATORY_CARE_PROVIDER_SITE_OTHER): Payer: Medicare Other | Admitting: Family Medicine

## 2019-11-26 ENCOUNTER — Ambulatory Visit (INDEPENDENT_AMBULATORY_CARE_PROVIDER_SITE_OTHER): Payer: Medicare Other

## 2019-11-26 VITALS — BP 114/66 | HR 73 | Temp 97.9°F | Ht 66.0 in | Wt 140.4 lb

## 2019-11-26 DIAGNOSIS — Z8781 Personal history of (healed) traumatic fracture: Secondary | ICD-10-CM

## 2019-11-26 DIAGNOSIS — G8929 Other chronic pain: Secondary | ICD-10-CM

## 2019-11-26 DIAGNOSIS — Z7901 Long term (current) use of anticoagulants: Secondary | ICD-10-CM

## 2019-11-26 DIAGNOSIS — M545 Low back pain, unspecified: Secondary | ICD-10-CM | POA: Insufficient documentation

## 2019-11-26 DIAGNOSIS — M8580 Other specified disorders of bone density and structure, unspecified site: Secondary | ICD-10-CM

## 2019-11-26 HISTORY — DX: Personal history of (healed) traumatic fracture: Z87.81

## 2019-11-26 HISTORY — DX: Low back pain, unspecified: M54.50

## 2019-11-26 LAB — POCT INR: INR: 2.5 (ref 2.0–3.0)

## 2019-11-26 MED ORDER — TRAMADOL HCL 50 MG PO TABS: 50.0000 mg | ORAL_TABLET | Freq: Two times a day (BID) | ORAL | 0 refills | Status: DC | PRN

## 2019-11-26 NOTE — Patient Instructions (Addendum)
Pre visit review using our clinic review tool, if applicable. No additional management support is needed unless otherwise documented below in the visit note.  INR today 2.5 Continue current dosing, take 5 mg daily except 7.5mg  on Mondays. Recheck at next apt with PCP on 3/11

## 2019-11-26 NOTE — Assessment & Plan Note (Addendum)
Noted today - present back on CT from 04/2019. Depending on mechanism of injury this may qualify to consider her osteoporotic. Will further discuss treatment options at f/u visit.

## 2019-11-26 NOTE — Assessment & Plan Note (Addendum)
Regularly sees neurosurgery Dr Ellene Route. Has had steroid injections in the past. Hardware present (lateral plate) from anterolateral decompression/interbody fusion of L2/3 and L3/4 (07/2018)

## 2019-11-26 NOTE — Patient Instructions (Addendum)
Coumadin check today Lumbar films today - concern for possible upper lumbar/lower thoracic injury - will call you with radiology read In the interim, refill tramadol for breakthrough pain. Continue other treatment as up to now.

## 2019-11-26 NOTE — Assessment & Plan Note (Signed)
Acute pain after fall which occurred 10 days ago.  On coumadin - increases risk of complication.  Check films after trauma r/o new fracture.  With hardware present, low threshold to return to Dr Ellene Route for evaluation.

## 2019-11-26 NOTE — Assessment & Plan Note (Signed)
Reviewed latest DEXA. With chronic (remote) L1 vertebral wedge fracture noted on imaging today, may qualify for osteoporosis diagnosis - may discuss further treatment options.

## 2019-11-26 NOTE — Telephone Encounter (Signed)
Spoke with patient.  rec tramadol, heating pad, scheduled tylenol 1000mg  up to TID.  rec f/u with Dr Ellene Route if not improving with this.

## 2019-11-26 NOTE — Progress Notes (Signed)
This visit was conducted in person.  BP 114/66 (BP Location: Left Arm, Patient Position: Sitting, Cuff Size: Normal)   Pulse 73   Temp 97.9 F (36.6 C) (Temporal)   Ht 5\' 6"  (1.676 m)   Wt 140 lb 6 oz (63.7 kg)   SpO2 94%   BMI 22.66 kg/m    CC: R hip pain, low back pain Subjective:    Patient ID: [name], female    DOB: [birth year], 74 y.o.   MRN: [id number]  HPI: [name] is a 74 y.o. female presenting on 11/26/2019 for Hip Pain (C/o right hip pain and low back pain, worsening.  Pt fell at home on 11/16/19.  Tried ice/heat therapy, BioFreeze and tramadol. )   DOI: 2/6/20221 - fell off ladder at home - was cleaning laundry room cabinets  Coming down ladder missed a step, landed on her back. No head trauma. Initial improvement, now pain seems to be moving to R lateral hip as well as across lower back. No shooting pain down legs, numbness/weakness down legs, bowel/bladder accidents, or fever. Notes pain when pushing during a bowel movement.   Treating at home with biofreeze, ice/heat, and tramadol. She also normally takes tylenol 1300mg  in the morning.   Has had back surgery in the past, sees Dr Ellene Route regularly.  Last had anterior lateral lumbar fusion (L2/3, L3/4 anterolateral decompression/interbody fusion with lateral plate fixation (QA348G).   On coumadin. INR planned for today.  Lab Results  Component Value Date   INR 2.5 11/26/2019   INR 1.8 (A) 11/04/2019   INR 1.6 (A) 10/17/2019    Trazodone is not helping sleep.  Known osteopenia - latest T score -1.7 (hip) 06/2018. She continues calcium/vit D + extra vit D 2000 iu daily.      Relevant past medical, surgical, family and social history reviewed and updated as indicated. Interim medical history since our last visit reviewed. Allergies and medications reviewed and updated. Outpatient Medications Prior to Visit  Medication Sig Dispense Refill  . acetaminophen (TYLENOL) 500 MG tablet Take 2  tablets (1,000 mg total) by mouth daily.    Marland Kitchen albuterol (VENTOLIN HFA) 108 (90 Base) MCG/ACT inhaler Inhale 2 puffs into the lungs every 6 (six) hours as needed for wheezing. 18 g 6  . amoxicillin (AMOXIL) 500 MG capsule TAKE 4 CAPSULES BY MOUTH 30-60 MINUTES PRIOR TO APPOINTMENT 4 capsule 12  . Biotin 1000 MCG tablet Take 1,000 mcg by mouth daily.    Marland Kitchen BREO ELLIPTA 200-25 MCG/INH AEPB INHALE 1 PUFF INTO THE LUNGS EVERY DAY 60 each 3  . Calcium Carb-Cholecalciferol (CALCIUM 600+D) 600-800 MG-UNIT TABS Take 1 tablet by mouth every evening.    . Cholecalciferol (VITAMIN D) 2000 UNITS CAPS Take 2,000 Units by mouth every evening.     . docusate sodium (COLACE) 100 MG capsule Take 200 mg by mouth every evening.    . furosemide (LASIX) 40 MG tablet Take 1 tablet (40 mg total) by mouth daily. 30 tablet 11  . ipratropium-albuterol (DUONEB) 0.5-2.5 (3) MG/3ML SOLN Take 3 mLs by nebulization every 6 (six) hours as needed. (Patient taking differently: Take 3 mLs by nebulization every 6 (six) hours as needed (asthma). ) 360 mL 0  . lovastatin (MEVACOR) 40 MG tablet TAKE 1 TABLET(40 MG) BY MOUTH AT BEDTIME 90 tablet 3  . montelukast (SINGULAIR) 10 MG tablet TAKE 1 TABLET(10 MG) BY MOUTH AT BEDTIME 30 tablet 3  . Multiple Vitamins-Minerals (EMERGEN-C IMMUNE PO) Take  1 packet by mouth daily.    . Multiple Vitamins-Minerals (PRESERVISION AREDS 2 PO) Take 2 capsules by mouth every evening.     . nitrofurantoin, macrocrystal-monohydrate, (MACROBID) 100 MG capsule Take 100 mg by mouth every evening.     . polycarbophil (FIBERCON) 625 MG tablet Take 625 mg by mouth every evening.     . polyethylene glycol (MIRALAX / GLYCOLAX) packet Take 17 g by mouth daily. 14 each 0  . potassium chloride (K-DUR) 10 MEQ tablet Take 2 tablets (20 mEq total) by mouth daily. 60 tablet 11  . Talc (ZEASORB EX) Apply 1 application topically daily as needed (yeast infection under stomach). Under skin folds (abdomen)    . tiotropium  (SPIRIVA HANDIHALER) 18 MCG inhalation capsule INHALE THE CONTENTS OF 1 CAPSULE VIA INHALATION DEVICE EVERY DAY 30 capsule 0  . TOVIAZ 8 MG TB24 tablet Take 8 mg by mouth every evening.   11  . traZODone (DESYREL) 50 MG tablet Take 0.5-1 tablets (25-50 mg total) by mouth at bedtime as needed for sleep. 30 tablet 3  . vitamin B-12 (CYANOCOBALAMIN) 500 MCG tablet Take 500 mcg by mouth every other day.     . warfarin (COUMADIN) 5 MG tablet TAKE 1-2 TABLETS DAILY AS DIRECTED BY COUMADIN CLINIC 60 tablet 5  . Zinc 50 MG CAPS Take 1 capsule (50 mg total) by mouth daily.  0  . HYDROcodone-acetaminophen (NORCO/VICODIN) 5-325 MG tablet Take 1 tablet by mouth every 4 (four) hours as needed for severe pain ((score 7 to 10)). 40 tablet 0  . traMADol (ULTRAM) 50 MG tablet Take 0.5-1 tablets (25-50 mg total) by mouth 2 (two) times daily as needed for moderate pain. 30 tablet 0   Facility-Administered Medications Prior to Visit  Medication Dose Route Frequency Provider Last Rate Last Admin  . ondansetron (ZOFRAN) 4 mg in sodium chloride 0.9 % 50 mL IVPB  4 mg Intravenous Q6H PRN Kristeen Miss, MD         Per HPI unless specifically indicated in ROS section below Review of Systems Objective:    BP 114/66 (BP Location: Left Arm, Patient Position: Sitting, Cuff Size: Normal)   Pulse 73   Temp 97.9 F (36.6 C) (Temporal)   Ht 5\' 6"  (1.676 m)   Wt 140 lb 6 oz (63.7 kg)   SpO2 94%   BMI 22.66 kg/m   Wt Readings from Last 3 Encounters:  11/26/19 140 lb 6 oz (63.7 kg)  11/04/19 138 lb 3 oz (62.7 kg)  09/09/19 135 lb 9.6 oz (61.5 kg)    Physical Exam Vitals and nursing note reviewed.  Constitutional:      General: She is not in acute distress.    Appearance: Normal appearance. She is well-developed.  Musculoskeletal:        General: Tenderness present. Normal range of motion.     Right lower leg: No edema.     Left lower leg: No edema.     Comments:  + pain to palpation midline mid lumbar  spine Scoliosis present No significant paraspinous mm tenderness Neg SLR bilaterally. No pain with int/ext rotation at hip. No pain at SIJ, GTB or sciatic notch bilaterally.   Skin:    General: Skin is warm and dry.     Findings: No erythema or rash.  Neurological:     Mental Status: She is alert.     Motor: Motor function is intact.     Deep Tendon Reflexes:     Reflex  Scores:      Patellar reflexes are 2+ on the right side and 2+ on the left side.    Comments: 5/5 strength BLE       Results for orders placed or performed in visit on 11/26/19  POCT INR  Result Value Ref Range   INR 2.5 2.0 - 3.0   *Note: Due to a large number of results and/or encounters for the requested time period, some results have not been displayed. A complete set of results can be found in Results Review.   Assessment & Plan:  This visit occurred during the SARS-CoV-2 public health emergency.  Safety protocols were in place, including screening questions prior to the visit, additional usage of staff PPE, and extensive cleaning of exam room while observing appropriate contact time as indicated for disinfecting solutions.   Problem List Items Addressed This Visit    Osteopenia    Reviewed latest DEXA. With chronic (remote) L1 vertebral wedge fracture noted on imaging today, may qualify for osteoporosis diagnosis - may discuss further treatment options.       History of vertebral fracture    Noted today - present back on CT from 04/2019. Depending on mechanism of injury this may qualify to consider her osteoporotic. Will further discuss treatment options at f/u visit.       Chronic lumbar pain    Regularly sees neurosurgery Dr Ellene Route. Has had steroid injections in the past. Hardware present (lateral plate) from anterolateral decompression/interbody fusion of L2/3 and L3/4 (07/2018)      Relevant Medications   traMADol (ULTRAM) 50 MG tablet   Acute right-sided low back pain without sciatica - Primary     Acute pain after fall which occurred 10 days ago.  On coumadin - increases risk of complication.  Check films after trauma r/o new fracture.  With hardware present, low threshold to return to Dr Ellene Route for evaluation.       Relevant Medications   traMADol (ULTRAM) 50 MG tablet   Other Relevant Orders   DG Lumbar Spine Complete (Completed)       Meds ordered this encounter  Medications  . traMADol (ULTRAM) 50 MG tablet    Sig: Take 1 tablet (50 mg total) by mouth 2 (two) times daily as needed for moderate pain.    Dispense:  20 tablet    Refill:  0   Orders Placed This Encounter  Procedures  . DG Lumbar Spine Complete    Standing Status:   Future    Number of Occurrences:   1    Standing Expiration Date:   01/23/2021    Order Specific Question:   Reason for Exam (SYMPTOM  OR DIAGNOSIS REQUIRED)    Answer:   low mid and right back pain after fall 11/16/2019    Order Specific Question:   Preferred imaging location?    Answer:   [facility]    Order Specific Question:   Radiology Contrast Protocol - do NOT remove file path    Answer:   [email or web address]    Patient Instructions  Coumadin check today Lumbar films today - concern for possible upper lumbar/lower thoracic injury - will call you with radiology read In the interim, refill tramadol for breakthrough pain. Continue other treatment as up to now.    Follow up plan: Return in about 1 year (around 11/25/2020), or if symptoms worsen or fail to improve.  Ria Bush, MD

## 2019-11-26 NOTE — Telephone Encounter (Signed)
Patient left a voicemail wanting to know what the step would be?

## 2019-11-28 ENCOUNTER — Telehealth: Payer: Self-pay | Admitting: Internal Medicine

## 2019-11-28 NOTE — Telephone Encounter (Signed)
[name] last seen by Wyn Quaker in July 2020.   Plan  - give 15 min slot in April 2021

## 2019-12-01 ENCOUNTER — Encounter: Payer: Self-pay | Admitting: Family Medicine

## 2019-12-09 NOTE — Telephone Encounter (Signed)
Called and spoke with pt to see if I could get an appt scheduled per MR and she stated that was fine. Pt has been scheduled a f/u with MR 4/14.nothing further needed.

## 2019-12-19 ENCOUNTER — Other Ambulatory Visit: Payer: Self-pay

## 2019-12-19 ENCOUNTER — Ambulatory Visit: Payer: Medicare Other | Admitting: Family Medicine

## 2019-12-19 ENCOUNTER — Ambulatory Visit (INDEPENDENT_AMBULATORY_CARE_PROVIDER_SITE_OTHER): Payer: Medicare Other

## 2019-12-19 ENCOUNTER — Encounter: Payer: Self-pay | Admitting: Family Medicine

## 2019-12-19 ENCOUNTER — Ambulatory Visit (INDEPENDENT_AMBULATORY_CARE_PROVIDER_SITE_OTHER): Payer: Medicare Other | Admitting: Family Medicine

## 2019-12-19 VITALS — BP 134/76 | HR 74 | Temp 97.6°F | Ht 66.0 in | Wt 139.5 lb

## 2019-12-19 DIAGNOSIS — Z8781 Personal history of (healed) traumatic fracture: Secondary | ICD-10-CM

## 2019-12-19 DIAGNOSIS — M8580 Other specified disorders of bone density and structure, unspecified site: Secondary | ICD-10-CM | POA: Diagnosis not present

## 2019-12-19 DIAGNOSIS — Z7901 Long term (current) use of anticoagulants: Secondary | ICD-10-CM | POA: Diagnosis not present

## 2019-12-19 DIAGNOSIS — F5104 Psychophysiologic insomnia: Secondary | ICD-10-CM

## 2019-12-19 DIAGNOSIS — R413 Other amnesia: Secondary | ICD-10-CM | POA: Diagnosis not present

## 2019-12-19 LAB — POCT INR: INR: 2.1 (ref 2.0–3.0)

## 2019-12-19 MED ORDER — DOXEPIN HCL 3 MG PO TABS: 1.0000 | ORAL_TABLET | Freq: Every day | ORAL | 1 refills | Status: DC

## 2019-12-19 NOTE — Assessment & Plan Note (Signed)
MMSE 30/30, CDT 3/4 (some misalignment of 3 o'clock). Reassuring exam - no signs of dementia or MCI. Reviewed measures to maintain healthy mind (see instructions). Will continue to monitor. ?poor sleep effect on memory - see above. If worsening, consider neurocognitive eval.

## 2019-12-19 NOTE — Progress Notes (Signed)
This visit was conducted in person.  BP 134/76 (BP Location: Left Arm, Patient Position: Sitting, Cuff Size: Normal)   Pulse 74   Temp 97.6 F (36.4 C) (Temporal)   Ht 5\' 6"  (1.676 m)   Wt 139 lb 8 oz (63.3 kg)   SpO2 96%   BMI 22.52 kg/m    CC: geriatric assessment Subjective:    Patient ID: [name], female    DOB: [birth year], 74 y.o.   MRN: [id number]  HPI: [name] is a 74 y.o. female presenting on 12/19/2019 for Follow-up (Here for 4-6 wk geriatric assessement. ) and Insomnia (Stopped trazadone, not helping.  Wants to discuss other sleep med options. )   See prior note for details.  Upcoming appt with NSG scheduled for next week - for worsening back pain after fall. Tramadol doesn't help sleep but still makes her groggy the next morning.   From prior visit 10/2019: Noticing memory loss - notes she doesn't remember periods of time. Sometimes notes trouble completing thoughts when speaking.  She notes periods of time she has no recollection over - mainly when asked about it later. Also notes some word finding difficulty.   Chronic sleep maintenance insomnia since 2016 - trazodone didn't help. Has tried benadryl, melatonin without benefit. Asks about temazepam. Has good bedtime routine. Sleeps 1-2 hours at a time - wakes up 5-6 times a night. No alcohol. Does drink caffeine - advised stop this.   Geriatric Assessment: Activities of Daily Living:     Bathing- independent    Dressing- independent    Eating- independent    Toileting- independent    Transferring- independent    Continence- independent (urinary - uses 1-2 pads daily) Overall Assessment: independent  Instrumental Activities of Daily Living:     Transportation- independent    Meal/Food Preparation- independent    Shopping Errands- independent    Housekeeping/Chores- independent     Money Management/Finances- independent     Medication Management- independent     Ability to Use  Telephone- independent     Laundry- independent  Overall Assessment:  independent  Mental Status Exam: 30/30 (value/max value) Clock Drawing Score: 3/4  Completed HS, some college     Relevant past medical, surgical, family and social history reviewed and updated as indicated. Interim medical history since our last visit reviewed. Allergies and medications reviewed and updated. Outpatient Medications Prior to Visit  Medication Sig Dispense Refill  . acetaminophen (TYLENOL) 500 MG tablet Take 2 tablets (1,000 mg total) by mouth daily.    Marland Kitchen albuterol (VENTOLIN HFA) 108 (90 Base) MCG/ACT inhaler Inhale 2 puffs into the lungs every 6 (six) hours as needed for wheezing. 18 g 6  . amoxicillin (AMOXIL) 500 MG capsule TAKE 4 CAPSULES BY MOUTH 30-60 MINUTES PRIOR TO APPOINTMENT 4 capsule 12  . Biotin 1000 MCG tablet Take 1,000 mcg by mouth daily.    Marland Kitchen BREO ELLIPTA 200-25 MCG/INH AEPB INHALE 1 PUFF INTO THE LUNGS EVERY DAY 60 each 3  . Calcium Carb-Cholecalciferol (CALCIUM 600+D) 600-800 MG-UNIT TABS Take 1 tablet by mouth every evening.    . Cholecalciferol (VITAMIN D) 2000 UNITS CAPS Take 2,000 Units by mouth every evening.     . docusate sodium (COLACE) 100 MG capsule Take 200 mg by mouth every evening.    . furosemide (LASIX) 40 MG tablet Take 1 tablet (40 mg total) by mouth daily. 30 tablet 11  . ipratropium-albuterol (DUONEB) 0.5-2.5 (3) MG/3ML SOLN Take 3 mLs  by nebulization every 6 (six) hours as needed. (Patient taking differently: Take 3 mLs by nebulization every 6 (six) hours as needed (asthma). ) 360 mL 0  . lovastatin (MEVACOR) 40 MG tablet TAKE 1 TABLET(40 MG) BY MOUTH AT BEDTIME 90 tablet 3  . montelukast (SINGULAIR) 10 MG tablet TAKE 1 TABLET(10 MG) BY MOUTH AT BEDTIME 30 tablet 3  . Multiple Vitamins-Minerals (EMERGEN-C IMMUNE PO) Take 1 packet by mouth daily.    . Multiple Vitamins-Minerals (PRESERVISION AREDS 2 PO) Take 2 capsules by mouth every evening.     . nitrofurantoin,  macrocrystal-monohydrate, (MACROBID) 100 MG capsule Take 100 mg by mouth every evening.     . polycarbophil (FIBERCON) 625 MG tablet Take 625 mg by mouth every evening.     . polyethylene glycol (MIRALAX / GLYCOLAX) packet Take 17 g by mouth daily. 14 each 0  . potassium chloride (K-DUR) 10 MEQ tablet Take 2 tablets (20 mEq total) by mouth daily. 60 tablet 11  . Talc (ZEASORB EX) Apply 1 application topically daily as needed (yeast infection under stomach). Under skin folds (abdomen)    . tiotropium (SPIRIVA HANDIHALER) 18 MCG inhalation capsule INHALE THE CONTENTS OF 1 CAPSULE VIA INHALATION DEVICE EVERY DAY 30 capsule 0  . TOVIAZ 8 MG TB24 tablet Take 8 mg by mouth every evening.   11  . traMADol (ULTRAM) 50 MG tablet Take 1 tablet (50 mg total) by mouth 2 (two) times daily as needed for moderate pain. 20 tablet 0  . vitamin B-12 (CYANOCOBALAMIN) 500 MCG tablet Take 500 mcg by mouth every other day.     . warfarin (COUMADIN) 5 MG tablet TAKE 1-2 TABLETS DAILY AS DIRECTED BY COUMADIN CLINIC 60 tablet 5  . Zinc 50 MG CAPS Take 1 capsule (50 mg total) by mouth daily.  0  . traZODone (DESYREL) 50 MG tablet Take 0.5-1 tablets (25-50 mg total) by mouth at bedtime as needed for sleep. 30 tablet 3   Facility-Administered Medications Prior to Visit  Medication Dose Route Frequency Provider Last Rate Last Admin  . ondansetron (ZOFRAN) 4 mg in sodium chloride 0.9 % 50 mL IVPB  4 mg Intravenous Q6H PRN Kristeen Miss, MD         Per HPI unless specifically indicated in ROS section below Review of Systems Objective:    BP 134/76 (BP Location: Left Arm, Patient Position: Sitting, Cuff Size: Normal)   Pulse 74   Temp 97.6 F (36.4 C) (Temporal)   Ht 5\' 6"  (1.676 m)   Wt 139 lb 8 oz (63.3 kg)   SpO2 96%   BMI 22.52 kg/m   Wt Readings from Last 3 Encounters:  12/19/19 139 lb 8 oz (63.3 kg)  11/26/19 140 lb 6 oz (63.7 kg)  11/04/19 138 lb 3 oz (62.7 kg)    Physical Exam Vitals and nursing note  reviewed.  Constitutional:      Appearance: Normal appearance. She is not ill-appearing.  Neurological:     Mental Status: She is alert.  Psychiatric:        Mood and Affect: Mood normal.        Behavior: Behavior normal.       Results for orders placed or performed in visit on 11/26/19  POCT INR  Result Value Ref Range   INR 2.5 2.0 - 3.0   *Note: Due to a large number of results and/or encounters for the requested time period, some results have not been displayed. A complete set of  results can be found in Results Review.   Assessment & Plan:  This visit occurred during the SARS-CoV-2 public health emergency.  Safety protocols were in place, including screening questions prior to the visit, additional usage of staff PPE, and extensive cleaning of exam room while observing appropriate contact time as indicated for disinfecting solutions.   Problem List Items Addressed This Visit    Osteopenia    See below.       Memory loss - Primary    MMSE 30/30, CDT 3/4 (some misalignment of 3 o'clock). Reassuring exam - no signs of dementia or MCI. Reviewed measures to maintain healthy mind (see instructions). Will continue to monitor. ?poor sleep effect on memory - see above. If worsening, consider neurocognitive eval.       History of vertebral fracture    Remote L1 compression fracture recently noted. Reviewed with patient how this may qualify her for osteoporosis diagnosis. She would like to wait for rpt DEXA later this year prior to discussing treatment options.       Chronic insomnia    Longstanding sleep maintenance insomnia.  Tried and failed trazodone, melatonin, benadryl OTC. Reviewed sleep hygiene. Recommend trial silenor. If 3mg  dose unaffordable, will send 25mg  doxepin to try. If fails this, would try temazepam - reviewed habit forming nature of medication as well as possible effect on memory if used long term.           Meds ordered this encounter  Medications  .  DISCONTD: Doxepin HCl (SILENOR) 3 MG TABS    Sig: Take 1 tablet (3 mg total) by mouth at bedtime.    Dispense:  30 tablet    Refill:  1  . Doxepin HCl (SILENOR) 3 MG TABS    Sig: Take 1 tablet (3 mg total) by mouth at bedtime.    Dispense:  30 tablet    Refill:  1   No orders of the defined types were placed in this encounter.   Patient Instructions  Try silenor 3mg  at bedtime for sleep. If unaffordable, let me know and I will send in 25mg  doxepin (same medicine, but not brand). If not helping, we may try temazepam.  Memory testing overall looking ok today.  We will work towards better sleep and note effect on cognition.  Continue social engagement, healthy nutritious diet, physical activity and word puzzles, reading to keep mind healthy.  Good to see you today.    Follow up plan: Return if symptoms worsen or fail to improve.  Ria Bush, MD

## 2019-12-19 NOTE — Patient Instructions (Addendum)
Try silenor 3mg  at bedtime for sleep. If unaffordable, let me know and I will send in 25mg  doxepin (same medicine, but not brand). If not helping, we may try temazepam.  Memory testing overall looking ok today.  We will work towards better sleep and note effect on cognition.  Continue social engagement, healthy nutritious diet, physical activity and word puzzles, reading to keep mind healthy.  Good to see you today.

## 2019-12-19 NOTE — Assessment & Plan Note (Addendum)
Longstanding sleep maintenance insomnia.  Tried and failed trazodone, melatonin, benadryl OTC. Reviewed sleep hygiene. Recommend trial silenor. If 3mg  dose unaffordable, will send 25mg  doxepin to try. If fails this, would try temazepam - reviewed habit forming nature of medication as well as possible effect on memory if used long term.

## 2019-12-19 NOTE — Patient Instructions (Addendum)
Pre visit review using our clinic review tool, if applicable. No additional management support is needed unless otherwise documented below in the visit note.  Continue current dosing, take 5 mg daily except 7.5mg  on Mondays. Recheck in 4 wks.

## 2019-12-19 NOTE — Assessment & Plan Note (Signed)
See below

## 2019-12-19 NOTE — Assessment & Plan Note (Signed)
Remote L1 compression fracture recently noted. Reviewed with patient how this may qualify her for osteoporosis diagnosis. She would like to wait for rpt DEXA later this year prior to discussing treatment options.

## 2019-12-24 ENCOUNTER — Other Ambulatory Visit: Payer: Self-pay | Admitting: Internal Medicine

## 2019-12-24 ENCOUNTER — Other Ambulatory Visit: Payer: Self-pay | Admitting: Pulmonary Disease

## 2019-12-27 ENCOUNTER — Telehealth: Payer: Self-pay

## 2019-12-27 ENCOUNTER — Telehealth: Payer: Self-pay | Admitting: *Deleted

## 2019-12-27 DIAGNOSIS — M545 Low back pain, unspecified: Secondary | ICD-10-CM

## 2019-12-27 DIAGNOSIS — Z7901 Long term (current) use of anticoagulants: Secondary | ICD-10-CM

## 2019-12-27 DIAGNOSIS — S32010A Wedge compression fracture of first lumbar vertebra, initial encounter for closed fracture: Secondary | ICD-10-CM | POA: Insufficient documentation

## 2019-12-27 NOTE — Telephone Encounter (Signed)
   Primary Cardiologist: Sanda Klein, MD  Chart reviewed and patient contacted today by phone as part of pre-operative protocol coverage. Given past medical history and time since last visit, based on ACC/AHA guidelines, [name] would be at acceptable risk for the planned procedure without further cardiovascular testing.   Per our pharmacy protocol OK to hold warfarin 5 days pre op, resume post op.   I will route this recommendation to the requesting party via Epic fax function and remove from pre-op pool.  Please call with questions.  Kerin Ransom, PA-C 12/27/2019, 4:11 PM

## 2019-12-27 NOTE — Telephone Encounter (Signed)
Noted. Thank you. Will cc Larene Beach as fyi.

## 2019-12-27 NOTE — Telephone Encounter (Signed)
Pt takes warfarin for afib/flutter with CHADS2VASc score of 4 (age, sex, CHF, HTN), s/p 2 bioprosthetic valve replacements. Ok to hold warfarin for 5 days prior to spinal injection. Please forward to PCP as an FYI since they manage patient's warfarin.

## 2019-12-27 NOTE — Telephone Encounter (Signed)
Received fax from [facility] reporting pt will have steroid injection on 4/52 and if PCP wanted coumadin held.   LVM for office to call back. Usually the surgeon decides if pt should hold coumadin. If they would like coumadin held then pt would be bridged with lovenox.

## 2019-12-27 NOTE — Telephone Encounter (Signed)
   [facility] Pre-operative Risk Assessment    Request for surgical clearance:  1. What type of surgery is being performed? L2-3 Translaminar epidural steroid injection   2. When is this surgery scheduled? 01/13/20   3. What type of clearance is required (medical clearance vs. Pharmacy clearance to hold med vs. Both)? both  4. Are there any medications that need to be held prior to surgery and how long? coumadin   5. Practice name and name of physician performing surgery? [facility]   6. What is your office phone number [phone]    7.   What is your office fax number [phone]  8.   Anesthesia type (None, local, MAC, general) ? IV sedation   Ragan Duhon A Trinidad Ingle 12/27/2019, 1:24 PM  _________________________________________________________________

## 2019-12-31 NOTE — Telephone Encounter (Signed)
Ria Bush, MD     12/27/19 5:50 PM Note   Noted. Thank you. Will cc Larene Beach as fyi.         12/27/19 4:13 PM Kilroy, Doreene Burke, PA-C routed this conversation to Ria Bush, MD  [name]     12/27/19 4:12 PM Note      Primary Cardiologist: Sanda Klein, MD  Chart reviewed and patient contacted today by phone as part of pre-operative protocol coverage. Given past medical history and time since last visit, based on ACC/AHA guidelines, [name] would be at acceptable risk for the planned procedure without further cardiovascular testing.   Per our pharmacy protocol OK to hold warfarin 5 days pre op, resume post op.   I will route this recommendation to the requesting party via Epic fax function and remove from pre-op pool.  Please call with questions.  Kerin Ransom, PA-C 12/27/2019, 4:11 PM

## 2019-12-31 NOTE — Telephone Encounter (Signed)
Advised pt of instructions. Pt verbalized understanding. Pt already had coumadin clinic apt on 4/8 so it will be changed to 4/9 @ 9. Pt agreed. Advised if any abnormal bruising or bleeding to contact this office. Pt verbalized understanding.

## 2019-12-31 NOTE — Telephone Encounter (Signed)
With these instructions the pt will take her last dose on 01/08/20. Surgery on 4/5. Will change dose starting 4/6. Pt will take 7.5mg  4/6, 4/7, and 4/8 then resume maintenance dosing taking 5mg  daily except take 7.5mg  on Mondays. Recheck on 4/9 and then on 4/13.   LVM for pt to call clinic.

## 2020-01-16 ENCOUNTER — Ambulatory Visit: Payer: Medicare Other

## 2020-01-17 ENCOUNTER — Ambulatory Visit (INDEPENDENT_AMBULATORY_CARE_PROVIDER_SITE_OTHER): Payer: Medicare Other

## 2020-01-17 ENCOUNTER — Other Ambulatory Visit: Payer: Self-pay

## 2020-01-17 DIAGNOSIS — Z7901 Long term (current) use of anticoagulants: Secondary | ICD-10-CM | POA: Diagnosis not present

## 2020-01-17 LAB — POCT INR: INR: 1.4 — AB (ref 2.0–3.0)

## 2020-01-17 NOTE — Patient Instructions (Addendum)
Pre visit review using our clinic review tool, if applicable. No additional management support is needed unless otherwise documented below in the visit note.  Take 7.5mg  today and tomorrow then return to taking 5mg  daily except 7.5mg  on Mondays. Return on 4/13 to recheck.

## 2020-01-20 ENCOUNTER — Telehealth: Payer: Self-pay | Admitting: Cardiovascular Disease

## 2020-01-20 NOTE — Telephone Encounter (Signed)
Patient states she has been some funning feeling episodes today, about 4 to 5. She states she gets lightheaded with a brief chill and then it goes away. It doesn't last long.  She says it's hard to explain the feeling. No CP, palpitations, or SOB.    She wants to know if she should send in a pacer transmission in if that would capture anything.

## 2020-01-20 NOTE — Telephone Encounter (Signed)
Spoke with patient of Dr. Loletha Grayer. Who reports 4-5 episodes of non-specific symptoms. No acute concerns - no CP, SOB, palps reported. Advised she send in manual transmission and will notify MD & device clinic to review report. Advised it will be tomorrow before she gets a response since it is late in the day. She asked if she would need to have her scheduled device transmission for next week in addition to this one day. Will defer to MD/device clinic for response

## 2020-01-21 ENCOUNTER — Other Ambulatory Visit: Payer: Self-pay

## 2020-01-21 ENCOUNTER — Ambulatory Visit (INDEPENDENT_AMBULATORY_CARE_PROVIDER_SITE_OTHER): Payer: Medicare Other

## 2020-01-21 DIAGNOSIS — Z7901 Long term (current) use of anticoagulants: Secondary | ICD-10-CM | POA: Diagnosis not present

## 2020-01-21 LAB — POCT INR: INR: 2.3 (ref 2.0–3.0)

## 2020-01-21 NOTE — Telephone Encounter (Signed)
Patient follow up with coumadin at [facility] (see noted in Epic). Will forward recommendation to them.

## 2020-01-21 NOTE — Telephone Encounter (Signed)
The patient has been made aware of the appointment with Dr. Sallyanne Kuster on 5/10 at 11:40.

## 2020-01-21 NOTE — Telephone Encounter (Signed)
Can we please add on at 11:40 on May 10? Repeat INR in the mean time, say on 04/23, give or take a day or two. Thanks

## 2020-01-21 NOTE — Patient Instructions (Addendum)
Pre visit review using our clinic review tool, if applicable. No additional management support is needed unless otherwise documented below in the visit note.  Continue taking 5mg  daily except 7.5mg  on Mondays. Recheck in 4 wks.

## 2020-01-21 NOTE — Telephone Encounter (Signed)
Looks like atrial flutter. We did manage to perform overdrive pacing for a similar problem about a year ago. It's been going on since January, just a few days after her previous pacemaker check. Rate is fairly well controlled. Unfortunately her INR fell out of range on 04/09, back in desirable range on 4/13 (today). Could Consider overdrive pacing after no less than 3 weeks of INR>2, for example in the office on Monday, May 10. Please recheck INR at least once more in next 2 weeks, then can recheck on Monday May 10. Left a message on her answering machine.

## 2020-01-21 NOTE — Telephone Encounter (Signed)
Pt called and reported she needed an apt on 4/23. Scheduled pt for 9am for INR check. Pt verbalized understanding.

## 2020-01-21 NOTE — Telephone Encounter (Signed)
Manual transmission received. Results notable for increased AF Burden, it appears pt has been in AF since the end of January.   V rates mostly controlled.  Pt with known history of PAF on [facility]

## 2020-01-22 ENCOUNTER — Encounter: Payer: Self-pay | Admitting: Internal Medicine

## 2020-01-22 ENCOUNTER — Ambulatory Visit (INDEPENDENT_AMBULATORY_CARE_PROVIDER_SITE_OTHER): Payer: Medicare Other | Admitting: Internal Medicine

## 2020-01-22 VITALS — BP 122/60 | HR 93 | Ht 66.0 in | Wt 137.2 lb

## 2020-01-22 DIAGNOSIS — J449 Chronic obstructive pulmonary disease, unspecified: Secondary | ICD-10-CM | POA: Diagnosis not present

## 2020-01-22 DIAGNOSIS — R918 Other nonspecific abnormal finding of lung field: Secondary | ICD-10-CM

## 2020-01-22 NOTE — Progress Notes (Signed)
PCP Diane Bush, MD  HPI    OV 08/02/2017  Chief Complaint  Patient presents with  . Follow-up    Pt states that she got bronchitis x1 month ago. Has been on two rounds of abx and two rounds of steroids. States that it is not in her chest anymore but is still having drainage, sore throat, dry cough, occ. SOB. Denies any CP.    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 She comes for routine follow-up. Recently she had COPD flareup was treated by primary care. Then I called in antibiotic and repeat prednisone and this again helped her. Nevertheless she is frustrated by postnasal drainage and the resultant cough. She says normally for COPD exacerbations are associated with initial onset of sinus congestion. Then she's always left with a residual postnasal drip but this time it has lingered longer. She says Sudafed helps her but is no longer available over-the-counter. She really wants relief about this critically. COPD cat score is 14. Is also asking me to sign handicap parking sticker   OV 10/31/2017  Chief Complaint  Patient presents with  . Follow-up    In hosp. 11/13-11/15 due to sepsis from UTI. Pt states she is doing good but can't seem to get her energy back. Pt currently has laryngitis and SOB with exertion.    Follow-up Gold stage II/III COPD not on home oxygen but on triple inhaler therapy.  Is a routine follow-up.  Since I last saw her in the middle of November 2018 she got diagnosed with E. coli sepsis and admitted to the hospital.  Since then she has significant amount of fatigue.  The fatigue is not improved much.  She has suspended her maintenance pulmonary rehabilitation but plans to restart it in March 2019.  She has not had any COPD exacerbations.  She is up-to-date with the flu shot.  Overall COPD CAT score is stable but there is an increase in the level of fatigue.  The only new issue that she complains about is some mild  hoarseness in voice going on for several months.  This is present early in the morning when she wakes up and then as the day goes on it is better.  She notices it when she talks a lot of time in the phone after a long period of rest.  It is insidious onset.  It is not progressive.  There are no other associated symptoms.   OV 02/05/2018  Chief Complaint  Patient presents with  . Follow-up    Pt states she saw PCP two weeks ago and had pna. Pt states she is still not over the pna and has no energy, has hoarseness in voice, cough, and has chest tightness.     Follow-up gold stage II/III COPD not on home oxygen but on triple inhaler therapy with Singulair  Routine follow-up.  She tells me t of labs from January 22, 2018 hat January 22, 2018 she started feeling chest tightness wheezing and also increase head cold.  She saw her primary care physician Dr. Danise [name].  Apparently all the blood work was normal except for mild increase in liver enzymes which she says she fluctuates with.  He was initially thinking a sinus congestion but when he did an x-ray of the report came back as a right lower lobe pneumonia [I personally visualized this x-ray and I agree she might not have right lower lobe pneumonia because in 2015  CT chest she had some chronic scarring at that area].  She was treated with 5-day Levaquin and a Medrol Dosepak but despite this other than subjective fevers are resolving she still has significant cough congestion and chest tightness without any orthopnea proximal nocturnal dyspnea.  COPD CAT score shows significant deterioration.  She is not feeling well.  Of note, she is intentionally losing weight because of weight watchers but she has not increased her fluid intake adequately.  She has quit all diet soda  Show slight increase in liver enzymes and also slight increase in creatinine compared to 6 months ago.   OV 03/13/2018  Chief Complaint  Patient presents with  . Follow-up    Breathing  is improved since last OV. Still reports DOE and occasional cough. Denies chest tightness or wheezing.    [name]  presents for follow-up of her gold stage II /3 of COPD.  On triple inhaler therapy with Spiriva and Brio and also on Singulair   Overall in terms of her COPD: She is stable.  She is compliant with her inhalers.  She is not on oxygen therapy.  Her symptoms actually improved.  In the interim she was diagnosed with atrial fibrillation and she is placed on Xarelto.  She is frustrated because she has to be on 1 more extra medication.  She did have pulmonary function test that is documented below because it has been a while since we did one.  It actually shows improvement in the lung function.  She also had a CT scan of the chest because the last one was in 2015.  This shows scattered lung nodules with the largest one 8 mm in the right lower lobe.  The radiologist has described this is nonspecific in appearance.  She heard the result and she has been extremely anxious and upset.  She is worried that her colon cancer has metastasized or she has lung cancer.  Of note, I personally visualized the CT chest and agree with the findings   OV 06/27/2018  Subjective:  Patient ID: [name], female , DOB: 10/11/1945 , age 71 y.o. , MRN: [id number] , ADDRESS: [address] [place] [address]   06/27/2018 -   Chief Complaint  Patient presents with  . Follow-up    Pt states her breathing has been doing good since last visit. States she has been having some BP issues to the point she fainted and cracked some ribs.  Denies any problems with cough or CP.     HPI [name] 74 y.o. - follow-up for  - COPD: Overall this is stable with inhaler therapy. She has not had her flu shot. She wants to wait 2 weeks because she just had first shot of her shingles vaccine. She is up-to-date with her pneumonia vaccine. In the interim she has lost weight 60 pounds intentionally and this is  improved on Score  Multiple  Lung nodules: She had follow-up CT chest in September 2019 and compared to May 2019: One of the nodules is resolved to 16 mm and stable. She does have some rib fractures subacute and this is because she fell down. She's been having balance issues with the weight loss and orthostatic hypotension. Both of this is being managed by neurosurgery and primary care  Travel advice: She plans to have back surgery for her foot drop after this she plans to recover at her son's home near [place]. Therefore she wants a antibiotic and prednisone taper   to be handy with her in case of a future COPD exacerbation.    Results for Mutch, Rey S (MRN [id number]) as of 03/13/2018 11:58  Ref. Range 11/29/2013 16:04 04/01/2016 10:10 03/13/2018 10:36  FEV1-Pre Latest Units: L 0.93 1.35 1.42  FEV1-%Pred-Pre Latest Units: % 34 55 59   Results for Mizuno, Natalin S (MRN [id number]) as of 03/13/2018 11:58  Ref. Range 03/13/2018 10:36  DLCO cor Latest Units: ml/min/mmHg 22.70  DLCO cor % pred Latest Units: % 84     CT chest Sept 4, 2019 Lungs/Pleura: Pleural thickening posteriorly in the right hemithorax is stable. There is no significant pleural effusion. There is stable subpleural scarring posteriorly in the right lower lobe. There is central airway thickening with scattered tree-in-bud nodularity in both lungs, similar to the most recent prior study. Nodular scarring in both upper lobes is stable. The right lower lobe perifissural nodule present on the most recent study has resolved. There is a stable 6 x 5 mm right lower lobe nodule on image 119/3. No new or enlarging pulmonary nodules.  IMPRESSION: 1. Generally stable chronic lung disease with pleuroparenchymal scarring and scattered nodularity. Most of these nodules have a tree-in-bud appearance, likely post infectious or inflammatory. The dominant right lower lobe nodule seen on the most recent study has resolved. No new or  enlarging nodules. 2. No acute chest findings. Healing subacute right-sided rib fractures. 3. Extensive Aortic Atherosclerosis (ICD10-I70.0).   Electronically Signed   By: William  Veazey [name]   On: 06/13/2018 14   ROS - per HPI  OV 01/22/2020  Subjective:  Patient ID: [name], female , DOB: 12/08/1945 , age 73 y.o. , MRN: [id number] , ADDRESS: [address] [place] [address]   01/22/2020 -   Chief Complaint  Patient presents with  . Follow-up    Pt states she has been doing good since last visit and denies any complaints.     HPI [name] 73 y.o. -presents for follow-up.  Last seen in 2019.  After that because of the pandemic have not seen her.  She saw the nurse practitioner once.  The big interval changes that she has lost a lot of weight.  It is intentional because she is try to keep her self it.  From a functional standpoint COPD symptoms are much improved after weight loss.  She takes Spiriva Singulair and Breo.  However she is having significant back pain.  Other than that she feels fine.  She not able to do pulmonary rehab because of the back pain.  She has had a Covid vaccine  Other issues lung nodules.  Her last CT scan of the chest was in July 2020.  She has waxing and waning.  She is willing to have another CT scan of the chest at the appropriate time interval.      CAT COPD Symptom & Quality of Life Score (GSK trademark) 0 is no burden. 5 is highest burden 08/02/2017  10/31/2017  02/05/2018  06/27/2018 60+ # intentioal weight loss  Never Cough -> Cough all the time 1 2 4 1  No phlegm in chest -> Chest is full of phlegm 0 0 3 0  No chest tightness -> Chest feels very tight 1 0 3 0  No dyspnea for 1 flight stairs/hill -> Very dyspneic for 1 flight of stairs 5 4 5 4  No limitations for ADL at home -> Very limited with ADL at home 1 0 5 3    Confident leaving home -> Not at all confident leaving home 0 0 3 1  Sleep soundly -> Do  not sleep soundly because of lung condition 4 4 3 3   Lots of Energy -> No energy at all 2 3 5 4   TOTAL Score (max 40)  14 13 31 16    CAT Score 01/22/2020  Total CAT Score 9      ROS - per HPI     has a past medical history of Acute cystitis without hematuria (12/22/2015), Anemia, Arthritis, CAP (community acquired pneumonia) (02/08/2016), CAP (community acquired pneumonia) (02/08/2016), Cataracts, bilateral, Chronic cholecystitis with calculus (01/21/2015), Chronic diastolic heart failure ([place]) (09/19/2014), Chronic insomnia, Constipation, COPD (chronic obstructive pulmonary disease) ([place]), DDD (degenerative disc disease), Dyspnea, Essential hypertension, GERD (gastroesophageal reflux disease), History of pneumonia (02/16/2016), History of radiation therapy (05/30/11 to 07/07/11), History of shingles, Hyperlipemia, Legally blind in left eye, as defined in [place], Lung nodule, Obesity, Osteopenia (12/2014), Pancreatitis (11/2014), PONV (postoperative nausea and vomiting), Presence of permanent cardiac pacemaker, Rectal cancer ([place]) (04/2011), Research study patient (08/26/2016), Rheumatic heart disease mitral stenosis, S/P AVR (aortic valve replacement) (2015), S/P MVR (mitral valve replacement) (2015), Seizures ([place]), Sepsis secondary to UTI ([place]) (08/22/2017), Severe aortic valve stenosis  AVR 3/15 (10/01/2013), Sleep apnea, Small bowel obstruction, partial ([place]) (08/2013), and Vitamin B12 deficiency (02/28/2015).   reports that she quit smoking about 45 years ago. Her smoking use included cigarettes. She has a 15.00 pack-year smoking history. She has never used smokeless tobacco.  Past Surgical History:  Procedure Laterality Date  . ANTERIOR LAT LUMBAR FUSION Left 07/17/2018   Procedure: Lumbar Two-Three Lumbar Three-Four Anterolateral decompression/interbody fusion with lateral plate fixation/Infuse;  Surgeon: Kristeen Miss, MD;  Location: [facility];  Service: Neurosurgery;  Laterality: Left;  Lumbar Two-Three  Lumbar Three-Four Anterolateral decompression/interbody fusion with lateral plate fixation/Infuse  . AORTIC VALVE REPLACEMENT N/A 12/12/2013   Procedure: AORTIC VALVE REPLACEMENT (AVR);  Surgeon: Gaye Pollack, MD; Service: Open Heart Surgery  . BACK SURGERY  2006, 2007   2006 SPACER, 2007 decompression and fusion L4/5  . BOWEL RESECTION  04/02/2012   Procedure: SMALL BOWEL RESECTION;  Surgeon: Stark Klein, MD;  Location: [facility];  Service: General;  Laterality: N/A;  . CARPAL TUNNEL RELEASE Bilateral   . CATARACT EXTRACTION W/ INTRAOCULAR LENS IMPLANT Left 10/18/2016  . CATARACT EXTRACTION W/ INTRAOCULAR LENS IMPLANT Right 12/13/2016   Dr. Kathrin Penner  . CHOLECYSTECTOMY N/A 01/21/2015   chronic cholecystitis, Stark Klein, MD  . COLON RESECTION  08/25/2011   Procedure: COLON RESECTION LAPAROSCOPIC;  Surgeon: Stark Klein, MD;  Location: [facility];  Service: General;  Laterality: N/A;  Laparoscopic Assisted Low Anterior Resection Diverting Ostomy and onQ pain pump  . COLONOSCOPY  03/2013   1 polyp, rpt 3 yrs Ardis Hughs)  . COLONOSCOPY WITH PROPOFOL N/A 06/09/2016   patent colo-colonic anastomosis, rpt 5 yrs Ardis Hughs)  . EP IMPLANTABLE DEVICE N/A 06/16/2016   Procedure: Pacemaker Implant;  Surgeon: Will Meredith Leeds, MD;  Location: [facility];  Service: Cardiovascular;  Laterality: N/A;  . FOOT SURGERY  left foot   hammer toe  . HAND SURGERY  Bil   carpal tunnel  . ILEOSTOMY  08/25/2011  . ILEOSTOMY CLOSURE  04/02/2012   Procedure: ILEOSTOMY TAKEDOWN;  Surgeon: Stark Klein, MD;  Location: [facility];  Service: General;  Laterality: N/A;  . INTRAOPERATIVE TRANSESOPHAGEAL ECHOCARDIOGRAM N/A 12/12/2013   Procedure: INTRAOPERATIVE TRANSESOPHAGEAL ECHOCARDIOGRAM;  Surgeon: Gaye Pollack, MD;  Location: [facility];  Service:  Open Heart Surgery;  Laterality: N/A;  . LEFT AND RIGHT HEART CATHETERIZATION WITH CORONARY ANGIOGRAM N/A 11/27/2013   Procedure: LEFT AND RIGHT HEART CATHETERIZATION WITH CORONARY  ANGIOGRAM;  Surgeon: Sanda Klein, MD;  Location: [facility];  Service: Cardiovascular;  Laterality: N/A;  . MITRAL VALVE REPLACEMENT N/A 12/12/2013   Procedure: MITRAL VALVE (MV) REPLACEMENT OR REPAIR;  Surgeon: Gaye Pollack, MD; Service: Open Heart Surgery  . TEE WITHOUT CARDIOVERSION N/A 11/29/2013   Procedure: TRANSESOPHAGEAL ECHOCARDIOGRAM (TEE);  Surgeon: Sueanne Margarita, MD;  Location: [facility];  Service: Cardiovascular;  Laterality: N/A;  . TEE WITHOUT CARDIOVERSION N/A 06/04/2019   Procedure: TRANSESOPHAGEAL ECHOCARDIOGRAM (TEE);  Surgeon: Sanda Klein, MD;  Location: [facility];  Service: Cardiovascular;  Laterality: N/A;  . TONSILLECTOMY      Allergies  Allergen Reactions  . Codeine Nausea Only  . Sulfa Antibiotics Nausea Only  . Sulfonamide Derivatives Nausea Only    Immunization History  Administered Date(s) Administered  . Fluad Quad(high Dose 65+) 07/11/2019  . H1N1 09/17/2008  . Influenza Split 07/18/2012  . Influenza Whole 08/10/2005, 06/24/2009, 07/11/2011  . Influenza, High Dose Seasonal PF 07/04/2016, 07/04/2017  . Influenza,inj,Quad PF,6+ Mos 07/09/2018  . Influenza-Unspecified 07/24/2013, 06/30/2014, 06/30/2015  . PFIZER SARS-COV-2 Vaccination 10/30/2019, 11/20/2019  . Pneumococcal Conjugate-13 06/02/2014  . Pneumococcal Polysaccharide-23 08/10/2005, 04/21/2008, 06/25/2013  . Td 10/11/2005  . Zoster 09/16/2014  . Zoster Recombinat (Shingrix) 06/25/2018, 08/28/2018    Family History  Problem Relation Age of Onset  . Lung cancer Father   . Heart disease Mother   . Diabetes Mother   . Hypertension Mother   . Stroke Mother   . CAD Son 79  . Heart attack Son 36  . Heart attack Maternal Grandfather   . Breast cancer Maternal Aunt 83  . Colon cancer Neg Hx   . Esophageal cancer Neg Hx   . Stomach cancer Neg Hx   . Rectal cancer Neg Hx      Current Outpatient Medications:  .  acetaminophen (TYLENOL) 500 MG tablet, Take 2 tablets (1,000 mg  total) by mouth daily., Disp: , Rfl:  .  albuterol (VENTOLIN HFA) 108 (90 Base) MCG/ACT inhaler, Inhale 2 puffs into the lungs every 6 (six) hours as needed for wheezing., Disp: 18 g, Rfl: 6 .  amoxicillin (AMOXIL) 500 MG capsule, TAKE 4 CAPSULES BY MOUTH 30-60 MINUTES PRIOR TO APPOINTMENT, Disp: 4 capsule, Rfl: 12 .  Biotin 1000 MCG tablet, Take 1,000 mcg by mouth daily., Disp: , Rfl:  .  BREO ELLIPTA 200-25 MCG/INH AEPB, INHALE 1 PUFF INTO THE LUNGS EVERY DAY, Disp: 60 each, Rfl: 3 .  Calcium Carb-Cholecalciferol (CALCIUM 600+D) 600-800 MG-UNIT TABS, Take 1 tablet by mouth every evening., Disp: , Rfl:  .  Cholecalciferol (VITAMIN D) 2000 UNITS CAPS, Take 2,000 Units by mouth every evening. , Disp: , Rfl:  .  docusate sodium (COLACE) 100 MG capsule, Take 200 mg by mouth every evening., Disp: , Rfl:  .  Doxepin HCl (SILENOR) 3 MG TABS, Take 1 tablet (3 mg total) by mouth at bedtime., Disp: 30 tablet, Rfl: 1 .  furosemide (LASIX) 40 MG tablet, Take 1 tablet (40 mg total) by mouth daily., Disp: 30 tablet, Rfl: 11 .  ipratropium-albuterol (DUONEB) 0.5-2.5 (3) MG/3ML SOLN, Take 3 mLs by nebulization every 6 (six) hours as needed. (Patient taking differently: Take 3 mLs by nebulization every 6 (six) hours as needed (asthma). ), Disp: 360 mL, Rfl: 0 .  lovastatin (MEVACOR) 40  MG tablet, TAKE 1 TABLET(40 MG) BY MOUTH AT BEDTIME, Disp: 90 tablet, Rfl: 3 .  montelukast (SINGULAIR) 10 MG tablet, TAKE 1 TABLET(10 MG) BY MOUTH AT BEDTIME, Disp: 30 tablet, Rfl: 3 .  Multiple Vitamins-Minerals (PRESERVISION AREDS 2 PO), Take 2 capsules by mouth every evening. , Disp: , Rfl:  .  nitrofurantoin, macrocrystal-monohydrate, (MACROBID) 100 MG capsule, Take 100 mg by mouth every evening. , Disp: , Rfl:  .  polycarbophil (FIBERCON) 625 MG tablet, Take 625 mg by mouth every evening. , Disp: , Rfl:  .  polyethylene glycol (MIRALAX / GLYCOLAX) packet, Take 17 g by mouth daily., Disp: 14 each, Rfl: 0 .  potassium chloride  (K-DUR) 10 MEQ tablet, Take 2 tablets (20 mEq total) by mouth daily., Disp: 60 tablet, Rfl: 11 .  SPIRIVA HANDIHALER 18 MCG inhalation capsule, INHALE THE CONTENTS OF 1 CAPSULE VIA INHALATION DEVICE EVERY DAY, Disp: 30 capsule, Rfl: 1 .  Talc (ZEASORB EX), Apply 1 application topically daily as needed (yeast infection under stomach). Under skin folds (abdomen), Disp: , Rfl:  .  TOVIAZ 8 MG TB24 tablet, Take 8 mg by mouth every evening. , Disp: , Rfl: 11 .  traMADol (ULTRAM) 50 MG tablet, Take 1 tablet (50 mg total) by mouth 2 (two) times daily as needed for moderate pain., Disp: 20 tablet, Rfl: 0 .  vitamin B-12 (CYANOCOBALAMIN) 500 MCG tablet, Take 500 mcg by mouth every other day. , Disp: , Rfl:  .  warfarin (COUMADIN) 5 MG tablet, TAKE 1-2 TABLETS DAILY AS DIRECTED BY COUMADIN CLINIC, Disp: 60 tablet, Rfl: 5 .  Zinc 50 MG CAPS, Take 1 capsule (50 mg total) by mouth daily., Disp: , Rfl: 0 No current facility-administered medications for this visit.  Facility-Administered Medications Ordered in Other Visits:  .  ondansetron (ZOFRAN) 4 mg in sodium chloride 0.9 % 50 mL IVPB, 4 mg, Intravenous, Q6H PRN, Kristeen Miss, MD      Objective:   Vitals:   01/22/20 1015  BP: 122/60  Pulse: 93  SpO2: 95%  Weight: 137 lb 3.2 oz (62.2 kg)  Height: 5\' 6"  (1.676 m)    Estimated body mass index is 22.14 kg/m as calculated from the following:   Height as of this encounter: 5\' 6"  (1.676 m).   Weight as of this encounter: 137 lb 3.2 oz (62.2 kg).  @WEIGHTCHANGE @  [employer]   01/22/20 1015  Weight: 137 lb 3.2 oz (62.2 kg)     Physical Exam Thin female.  Central scar of cardiac surgery present.  Alert and oriented x3.  Mallampati class II.  Clear to auscultation bilaterally except occasional squeaks.  Normal heart sounds.  Abdomen soft.  Mildly kyphotic.        Assessment:       ICD-10-CM   1. 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 Abnormal findings  on diagnostic imaging of lung  R91.8 CT Chest Wo Contrast  3. Multiple lung nodules on CT  R91.8        Plan:     Patient Instructions     Multiple lung nodules on CT - per radiologist these nodules are new since 2015 -> may 2019 -> waxing and waning July 2020 PLan - please repeat CT chest without contrast in 6-9 months  Moderate COPD (chronic obstructive pulmonary disease) ([place]) - stable  - continue spiriv, breo and singulair scheduled with duoneb as needed - you could consider stopping singulair for a month  and reassess   Followup - 6 to 9 months or sooner if needed     SIGNATURE    Dr. Brand Males, [name], F.C.C.P,  Pulmonary and Critical Care Medicine Staff Physician, [facility] Director - Interstitial Lung Disease  Program  Pulmonary [place] [facility] [place], [place], [address]  Pager: [phone], If no answer or between  15:00h - 7:00h: call [phone] Telephone: [phone]  10:46 AM 01/22/2020

## 2020-01-22 NOTE — Patient Instructions (Addendum)
    Multiple lung nodules on CT - per radiologist these nodules are new since 2015 -> may 2019 -> waxing and waning July 2020 PLan - please repeat CT chest without contrast in 6-9 months  Moderate COPD (chronic obstructive pulmonary disease) ([place]) - stable  - continue spiriv, breo and singulair scheduled with duoneb as needed - you could consider stopping singulair for a month and reassess   Followup - 6 to 9 months or sooner if needed

## 2020-01-24 ENCOUNTER — Other Ambulatory Visit: Payer: Self-pay

## 2020-01-24 MED ORDER — AMOXICILLIN 500 MG PO CAPS: ORAL_CAPSULE | ORAL | 12 refills | Status: DC

## 2020-01-31 ENCOUNTER — Ambulatory Visit (INDEPENDENT_AMBULATORY_CARE_PROVIDER_SITE_OTHER): Payer: Medicare Other

## 2020-01-31 ENCOUNTER — Ambulatory Visit (INDEPENDENT_AMBULATORY_CARE_PROVIDER_SITE_OTHER): Payer: Medicare Other | Admitting: *Deleted

## 2020-01-31 ENCOUNTER — Other Ambulatory Visit: Payer: Self-pay

## 2020-01-31 DIAGNOSIS — Z7901 Long term (current) use of anticoagulants: Secondary | ICD-10-CM | POA: Diagnosis not present

## 2020-01-31 DIAGNOSIS — R55 Syncope and collapse: Secondary | ICD-10-CM | POA: Diagnosis not present

## 2020-01-31 LAB — CUP PACEART REMOTE DEVICE CHECK
Battery Remaining Longevity: 110 mo
Battery Remaining Percentage: 95.5 %
Battery Voltage: 2.99 V
Brady Statistic AP VP Percent: 78 %
Brady Statistic AP VS Percent: 1 %
Brady Statistic AS VP Percent: 22 %
Brady Statistic AS VS Percent: 1 %
Brady Statistic RA Percent Paced: 54 %
Brady Statistic RV Percent Paced: 99 %
Date Time Interrogation Session: 20210423072346
Implantable Lead Implant Date: 20170907
Implantable Lead Implant Date: 20170907
Implantable Lead Location: 753859
Implantable Lead Location: 753860
Implantable Pulse Generator Implant Date: 20170907
Lead Channel Impedance Value: 410 Ohm
Lead Channel Impedance Value: 560 Ohm
Lead Channel Pacing Threshold Amplitude: 0.5 V
Lead Channel Pacing Threshold Amplitude: 0.625 V
Lead Channel Pacing Threshold Pulse Width: 0.5 ms
Lead Channel Pacing Threshold Pulse Width: 0.5 ms
Lead Channel Sensing Intrinsic Amplitude: 12 mV
Lead Channel Sensing Intrinsic Amplitude: 3.7 mV
Lead Channel Setting Pacing Amplitude: 0.875
Lead Channel Setting Pacing Amplitude: 2 V
Lead Channel Setting Pacing Pulse Width: 0.5 ms
Lead Channel Setting Sensing Sensitivity: 2 mV
Pulse Gen Model: 2272
Pulse Gen Serial Number: 7945283

## 2020-01-31 LAB — POCT INR: INR: 1.5 — AB (ref 2.0–3.0)

## 2020-01-31 NOTE — Progress Notes (Signed)
PPM Remote  

## 2020-01-31 NOTE — Patient Instructions (Addendum)
Pre visit review using our clinic review tool, if applicable. No additional management support is needed unless otherwise documented below in the visit note.  Increase dose today and tomorrow to 7.5mg  (1.5 tablets) then continue taking 5mg  daily except 7.5mg  on Mondays. Recheck in 1 wks.

## 2020-02-06 ENCOUNTER — Other Ambulatory Visit: Payer: Self-pay

## 2020-02-06 ENCOUNTER — Ambulatory Visit (INDEPENDENT_AMBULATORY_CARE_PROVIDER_SITE_OTHER): Payer: Medicare Other

## 2020-02-06 DIAGNOSIS — Z7901 Long term (current) use of anticoagulants: Secondary | ICD-10-CM

## 2020-02-06 LAB — POCT INR: INR: 2.2 (ref 2.0–3.0)

## 2020-02-06 NOTE — Patient Instructions (Addendum)
Pre visit review using our clinic review tool, if applicable. No additional management support is needed unless otherwise documented below in the visit note.  Continue 5mg daily except 7.5mg on Mondays.  Recheck in 4 wks.  

## 2020-02-17 ENCOUNTER — Encounter: Payer: Self-pay | Admitting: Cardiovascular Disease

## 2020-02-17 ENCOUNTER — Other Ambulatory Visit: Payer: Self-pay

## 2020-02-17 ENCOUNTER — Ambulatory Visit (INDEPENDENT_AMBULATORY_CARE_PROVIDER_SITE_OTHER): Payer: Medicare Other | Admitting: Cardiovascular Disease

## 2020-02-17 VITALS — BP 135/71 | HR 72 | Ht 66.0 in | Wt 140.0 lb

## 2020-02-17 DIAGNOSIS — T82857D Stenosis of cardiac prosthetic devices, implants and grafts, subsequent encounter: Secondary | ICD-10-CM

## 2020-02-17 DIAGNOSIS — J449 Chronic obstructive pulmonary disease, unspecified: Secondary | ICD-10-CM

## 2020-02-17 DIAGNOSIS — I441 Atrioventricular block, second degree: Secondary | ICD-10-CM

## 2020-02-17 DIAGNOSIS — Z952 Presence of prosthetic heart valve: Secondary | ICD-10-CM

## 2020-02-17 DIAGNOSIS — Z950 Presence of cardiac pacemaker: Secondary | ICD-10-CM

## 2020-02-17 DIAGNOSIS — I4819 Other persistent atrial fibrillation: Secondary | ICD-10-CM

## 2020-02-17 DIAGNOSIS — E7800 Pure hypercholesterolemia, unspecified: Secondary | ICD-10-CM

## 2020-02-17 DIAGNOSIS — I5032 Chronic diastolic (congestive) heart failure: Secondary | ICD-10-CM

## 2020-02-17 DIAGNOSIS — M4810 Ankylosing hyperostosis [Forestier], site unspecified: Secondary | ICD-10-CM

## 2020-02-17 NOTE — Progress Notes (Signed)
Cardiology office note     Date:  02/17/2020   ID:  [name], [place] January 09, 1946, MRN [id number]  Patient Location: Home Provider Location: Home  PCP:  Ria Bush, MD  Cardiologist:  Sanda Klein, MD  Electrophysiologist:  None   Evaluation Performed:  Follow-Up Visit  Chief Complaint: bioprosthetic mitral valve stenosis  History of Present Illness:    [name] is a 74 y.o. 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, [place], 2017), (typical?) atrial flutter (overdrive pacing March [id number]).  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 She has a remote history of transient amaurosis fugax during a period of subtherapeutic anticoagulation in the past.  Her pacemaker shows that she has been in persistent atrial fibrillation since January 25, for over 4 months now.  She has not noticed any real change in her stamina and is rarely aware of palpitations, only when she lies in bed at night for a few minutes.  She has not had any new neurological events.  She denies dyspnea at rest or with activity.  She is limited primarily by severe back pain.  She can only stand or walk for about 5 minutes at a time without experiencing pain (she has DISH).  She denies chest pain, syncope, dizziness.  She has chronic mild right ankle edema, always asymmetrical.  She is compliant with warfarin anticoagulation monitoring and has not had any serious bleeding problems, although she has marked bruising of her forearms.  She complains of night sweats.  She had a CT myelogram on May 08 2019.  There was severe left foraminal stenosis at L3-4 as well as moderate left foraminal stenosis at C4-5-6.  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   Comprehensive pacemaker check in the office today shows normal device function.  She has 51% atrial pacing (now in atrial fibrillation uninterrupted since January 25) and 100% ventricular pacing.  Underlying rhythm was not checked today.  The patient does not have symptoms concerning for COVID-19 infection (fever, chills, cough, or new shortness of breath).    Past Medical History:  Diagnosis Date  . Acute cystitis without hematuria 12/22/2015  . Anemia   . Arthritis   . CAP (community acquired pneumonia) 02/08/2016  . CAP (community acquired pneumonia) 02/08/2016  . Cataracts, bilateral    immature  . Chronic cholecystitis with calculus 01/21/2015   S/p cholecystectomy   . Chronic diastolic heart failure ([place]) 09/19/2014  . Chronic insomnia   . Constipation    takes Miralax daily as needed  . COPD (chronic obstructive pulmonary disease) (HCC)    Albuterol inhaler daily as needed;Duoneb daily as needed;Spiriva daily  . DDD (degenerative disc disease)    cervical - kyphosis with mod DD changes C5/6 and C6/7; lumbar - early DD at L2/3 ([place])  . Dyspnea    with exertion  . Essential hypertension    was on meds but after appointment Dec 11 with Dr.C he took her off  . GERD (gastroesophageal reflux disease)    takes Protonix daily  . History of pneumonia 02/16/2016  . History of radiation therapy 05/30/11 to 07/07/11   rectum  . History of shingles   . Hyperlipemia    takes Lovastatin daily  . Legally  blind in left eye, as defined in [place]   . Lung nodule    RLL nodule-64mm stable 2006, April 2009, and June 2009  . Obesity   . Osteopenia 12/2014   T -1.5 hip  . Pancreatitis 11/2014   ?zpack related vs gallstone pancreatitis with abnormal HIDA scan pending cholecystectomy  . PONV (postoperative nausea and vomiting)   . Presence of permanent cardiac pacemaker   . Rectal cancer ([place]) 04/2011   T3N0; s/p lap LAR, s/p ileostomy, s/p reversal, s/p chemo   . Research study patient 08/26/2016  . Rheumatic heart disease mitral stenosis    mod MS by echo 02/2014  . S/P AVR (aortic valve replacement) 2015   bioprosthetic ([place])  . S/P MVR (mitral valve replacement) 2015   bioprosthetic ([place])  . Seizures ([place])    febrile seizures as a child  . Sepsis secondary to UTI ([place]) 08/22/2017  . Severe aortic valve stenosis  AVR 3/15 10/01/2013   mild-mod by echo 02/2014  . Sleep apnea    PT TOLD BORDERLINE-TRIED CPAP-DID NOT HELP-SHE DOES NOT USE CPAP  . Small bowel obstruction, partial ([place]) 08/2013   reolved without NGT placement.   . Vitamin B12 deficiency 02/28/2015   Start B12 shots 02/2015    Past Surgical History:  Procedure Laterality Date  . ANTERIOR LAT LUMBAR FUSION Left 07/17/2018   Procedure: Lumbar Two-Three Lumbar Three-Four Anterolateral decompression/interbody fusion with lateral plate fixation/Infuse;  Surgeon: Kristeen Miss, MD;  Location: [facility];  Service: Neurosurgery;  Laterality: Left;  Lumbar Two-Three Lumbar Three-Four Anterolateral decompression/interbody fusion with lateral plate fixation/Infuse  . AORTIC VALVE REPLACEMENT N/A 12/12/2013   Procedure: AORTIC VALVE REPLACEMENT (AVR);  Surgeon: Gaye Pollack, MD; Service: Open Heart Surgery  . BACK SURGERY  2006, 2007   2006 SPACER, 2007 decompression and fusion L4/5  . BOWEL RESECTION  04/02/2012   Procedure: SMALL BOWEL RESECTION;  Surgeon: Stark Klein, MD;  Location: [facility];  Service: General;  Laterality: N/A;  . CARPAL TUNNEL RELEASE Bilateral   . CATARACT EXTRACTION W/ INTRAOCULAR LENS IMPLANT Left 10/18/2016  . CATARACT EXTRACTION W/ INTRAOCULAR LENS IMPLANT Right 12/13/2016   Dr. Kathrin Penner  . CHOLECYSTECTOMY N/A 01/21/2015   chronic cholecystitis, Stark Klein, MD  . COLON RESECTION  08/25/2011   Procedure: COLON RESECTION LAPAROSCOPIC;  Surgeon: Stark Klein, MD;  Location: [facility];  Service: General;  Laterality: N/A;  Laparoscopic Assisted Low Anterior  Resection Diverting Ostomy and onQ pain pump  . COLONOSCOPY  03/2013   1 polyp, rpt 3 yrs Ardis Hughs)  . COLONOSCOPY WITH PROPOFOL N/A 06/09/2016   patent colo-colonic anastomosis, rpt 5 yrs Ardis Hughs)  . EP IMPLANTABLE DEVICE N/A 06/16/2016   Procedure: Pacemaker Implant;  Surgeon: Will Meredith Leeds, MD;  Location: [facility];  Service: Cardiovascular;  Laterality: N/A;  . FOOT SURGERY  left foot   hammer toe  . HAND SURGERY  Bil   carpal tunnel  . ILEOSTOMY  08/25/2011  . ILEOSTOMY CLOSURE  04/02/2012   Procedure: ILEOSTOMY TAKEDOWN;  Surgeon: Stark Klein, MD;  Location: [facility];  Service: General;  Laterality: N/A;  . INTRAOPERATIVE TRANSESOPHAGEAL ECHOCARDIOGRAM N/A 12/12/2013   Procedure: INTRAOPERATIVE TRANSESOPHAGEAL ECHOCARDIOGRAM;  Surgeon: Gaye Pollack, MD;  Location: [facility];  Service: Open Heart Surgery;  Laterality: N/A;  . LEFT AND RIGHT HEART CATHETERIZATION WITH CORONARY ANGIOGRAM N/A 11/27/2013   Procedure: LEFT AND RIGHT HEART CATHETERIZATION WITH CORONARY ANGIOGRAM;  Surgeon: Sanda Klein, MD;  Location: [facility];  Service: Cardiovascular;  Laterality: N/A;  . MITRAL VALVE REPLACEMENT N/A 12/12/2013   Procedure: MITRAL VALVE (MV) REPLACEMENT OR REPAIR;  Surgeon: Gaye Pollack, MD; Service: Open Heart Surgery  . TEE WITHOUT CARDIOVERSION N/A 11/29/2013   Procedure: TRANSESOPHAGEAL ECHOCARDIOGRAM (TEE);  Surgeon: Sueanne Margarita, MD;  Location: [facility];  Service: Cardiovascular;  Laterality: N/A;  . TEE WITHOUT CARDIOVERSION N/A 06/04/2019   Procedure: TRANSESOPHAGEAL ECHOCARDIOGRAM (TEE);  Surgeon: Sanda Klein, MD;  Location: [facility];  Service: Cardiovascular;  Laterality: N/A;  . TONSILLECTOMY       Current Meds  Medication Sig  . acetaminophen (TYLENOL) 500 MG tablet Take 2 tablets (1,000 mg total) by mouth daily.  Marland Kitchen albuterol (VENTOLIN HFA) 108 (90 Base) MCG/ACT inhaler Inhale 2 puffs into the lungs every 6 (six) hours as needed for wheezing.  Marland Kitchen  amoxicillin (AMOXIL) 500 MG capsule TAKE 4 CAPSULES BY MOUTH 30-60 MINUTES PRIOR TO APPOINTMENT  . Biotin 1000 MCG tablet Take 1,000 mcg by mouth daily.  Marland Kitchen BREO ELLIPTA 200-25 MCG/INH AEPB INHALE 1 PUFF INTO THE LUNGS EVERY DAY  . Calcium Carb-Cholecalciferol (CALCIUM 600+D) 600-800 MG-UNIT TABS Take 1 tablet by mouth every evening.  . Cholecalciferol (VITAMIN D) 2000 UNITS CAPS Take 2,000 Units by mouth every evening.   . docusate sodium (COLACE) 100 MG capsule Take 200 mg by mouth every evening.  . furosemide (LASIX) 40 MG tablet Take 1 tablet (40 mg total) by mouth daily.  Marland Kitchen ipratropium-albuterol (DUONEB) 0.5-2.5 (3) MG/3ML SOLN Take 3 mLs by nebulization every 6 (six) hours as needed. (Patient taking differently: Take 3 mLs by nebulization every 6 (six) hours as needed (asthma). )  . lovastatin (MEVACOR) 40 MG tablet TAKE 1 TABLET(40 MG) BY MOUTH AT BEDTIME  . montelukast (SINGULAIR) 10 MG tablet TAKE 1 TABLET(10 MG) BY MOUTH AT BEDTIME  . Multiple Vitamins-Minerals (PRESERVISION AREDS 2 PO) Take 2 capsules by mouth every evening.   . nitrofurantoin, macrocrystal-monohydrate, (MACROBID) 100 MG capsule Take 100 mg by mouth every evening.   . polycarbophil (FIBERCON) 625 MG tablet Take 625 mg by mouth every evening.   . polyethylene glycol (MIRALAX / GLYCOLAX) packet Take 17 g by mouth daily.  . potassium chloride (K-DUR) 10 MEQ tablet Take 2 tablets (20 mEq total) by mouth daily.  Marland Kitchen SPIRIVA HANDIHALER 18 MCG inhalation capsule INHALE THE CONTENTS OF 1 CAPSULE VIA INHALATION DEVICE EVERY DAY  . [place] (ZEASORB EX) Apply 1 application topically daily as needed (yeast infection under stomach). Under skin folds (abdomen)  . TOVIAZ 8 MG TB24 tablet Take 8 mg by mouth every evening.   . traMADol (ULTRAM) 50 MG tablet Take 1 tablet (50 mg total) by mouth 2 (two) times daily as needed for moderate pain.  . vitamin B-12 (CYANOCOBALAMIN) 500 MCG tablet Take 500 mcg by mouth every other day.   . warfarin  (COUMADIN) 5 MG tablet TAKE 1-2 TABLETS DAILY AS DIRECTED BY COUMADIN CLINIC  . Zinc 50 MG CAPS Take 1 capsule (50 mg total) by mouth daily.  . [DISCONTINUED] Doxepin HCl (SILENOR) 3 MG TABS Take 1 tablet (3 mg total) by mouth at bedtime.     Allergies:   Codeine, Sulfa antibiotics, and Sulfonamide derivatives   Social History   Tobacco Use  . Smoking status: Former Smoker    Packs/day: 1.50    Years: 10.00    Pack years: 15.00    Types: Cigarettes    Quit date: 12/28/1974    Years since quitting: 45.1  .  Smokeless tobacco: Never Used  . Tobacco comment: quit smoking in 1976  Substance Use Topics  . Alcohol use: Not Currently    Alcohol/week: 2.0 standard drinks    Types: 2 Glasses of wine per week    Comment: very rare  . Drug use: No     Family Hx: The patient's family history includes Breast cancer (age of onset: 30) in her maternal aunt; CAD (age of onset: 36) in her son; Diabetes in her mother; Heart attack in her maternal grandfather; Heart attack (age of onset: 42) in her son; Heart disease in her mother; Hypertension in her mother; Lung cancer in her father; Stroke in her mother. There is no history of Colon cancer, Esophageal cancer, Stomach cancer, or Rectal cancer.  ROS:   Please see the history of present illness.     All other systems reviewed and are negative.   Prior CV studies:   The following studies were reviewed today:  Echocardiogram September 02, 2019  1. Left ventricular ejection fraction, by visual estimation, is 60 to 65%. The left ventricle has normal function. Left ventricular septal wall thickness was mildly increased. Mildly increased left ventricular posterior wall thickness. There is mildly  increased left ventricular hypertrophy.  2. Intraventricular gradient noted. Peak velocity 3.08 m/s. Peak gradient 38 mmHg.  3. Global right ventricle has normal systolic function.The right ventricular size is normal. No increase in right ventricular wall  thickness.  4. Left atrial size was normal.  5. Right atrial size was normal.  6. The mitral valve has been repaired/replaced. No evidence of mitral valve regurgitation. Moderate mitral stenosis.  7. 25 mm Edwards MagnaEase bioprosthetic mitral valve present. Moderate mitral stenosis. Mean gradient 9 mmHg compared wtih 11 mmHg 05/2019. Heart rate 71 bpm.  8. The tricuspid valve is normal in structure. Tricuspid valve regurgitation is mild.  9. Aortic valve mean gradient measures 12.0 mmHg. 10. Aortic valve peak gradient measures 20.9 mmHg. 11. Aortic valve regurgitation is not visualized. Mild aortic valve stenosis. 12. Mean aortic valve gradient unchanged from 05/2019 (11 mmHg). 13. The pulmonic valve was normal in structure. Pulmonic valve regurgitation is not visualized. 14. Mildly elevated pulmonary artery systolic pressure. 15. The inferior vena cava is normal in size with <50% respiratory variability, suggesting right atrial pressure of 8 mmHg.  In comparison to the previous echocardiogram(s): 05/14/19 EF 55-60%. AV 33mmHg mean, 61mmHg peak. MV 22mmHg mean, 53mmHg peak.  Labs/Other Tests and Data Reviewed:    EKG: Electrocardiogram was checked today and shows atrial fibrillation with ventricular paced rhythm.  The QRS is very broad at 216 ms and the QT is prolonged at 559 ms.  She has a distinct positive R wave in lead V1. Recent Labs: 04/23/2019: TSH 2.24 10/31/2019: ALT 21; BUN 25; Creatinine, Ser 0.74; Hemoglobin 14.1; Platelets 111.0; Potassium 3.8; Sodium 139   Recent Lipid Panel Lab Results  Component Value Date/Time   CHOL 135 04/23/2019 07:48 AM   TRIG 47.0 04/23/2019 07:48 AM   HDL 56.60 04/23/2019 07:48 AM   CHOLHDL 2 04/23/2019 07:48 AM   LDLCALC 69 04/23/2019 07:48 AM   LDLDIRECT 111.0 06/26/2015 09:28 AM   CT chest from 04/22/2019 shows "waxing/waning peribronchovascular nodularity in the lungs bilaterally, suggesting a combination of postinfectious inflammatory  scarring and atypical mycobacterial infection rather than metastatic disease".  There was evidence of three-vessel coronary and aortic atherosclerosis.  Tiny right pleural effusion, unchanged. On my review, highly suspicious that the tip of the right ventricular pacing  lead is in the pericardial space.  This would explain the positive R waves in leads V1.   Wt Readings from Last 3 Encounters:  02/17/20 140 lb (63.5 kg)  01/22/20 137 lb 3.2 oz (62.2 kg)  12/19/19 139 lb 8 oz (63.3 kg)     Objective:    Vital Signs:  BP 135/71   Pulse 72   Ht 5\' 6"  (1.676 m)   Wt 140 lb (63.5 kg)   SpO2 97%   BMI 22.60 kg/m      General: Alert, oriented x3, no distress, lean Head: no evidence of trauma, PERRL, EOMI, no exophtalmos or lid lag, no myxedema, no xanthelasma; normal ears, nose and oropharynx Neck: normal jugular venous pulsations and no hepatojugular reflux; brisk carotid pulses without delay and no carotid bruits Chest: clear to auscultation, no signs of consolidation by percussion or palpation, normal fremitus, symmetrical and full respiratory excursions Cardiovascular: normal position and quality of the apical impulse, regular rhythm, normal first and paradoxically split second heart sounds, no murmurs, rubs or gallops Abdomen: no tenderness or distention, no masses by palpation, no abnormal pulsatility or arterial bruits, normal bowel sounds, no hepatosplenomegaly Extremities: no clubbing, cyanosis; there is 1+ right ankle edema; 2+ radial, ulnar and brachial pulses bilaterally; 2+ right femoral, posterior tibial and dorsalis pedis pulses; 2+ left femoral, posterior tibial and dorsalis pedis pulses; no subclavian or femoral bruits Neurological: grossly nonfocal Psych: Normal mood and affect   ASSESSMENT & PLAN:    1. Chronic diastolic heart failure (HCC)   2. Stenosis of prosthetic mitral valve, subsequent encounter   3. S/P aortic valve replacement   4. Second degree AV block   5.  Persistent atrial fibrillation ([place])   6. Chronic obstructive pulmonary disease, unspecified COPD type ([place])   7. Hypercholesterolemia   8. Pacemaker   9. DISH (diffuse idiopathic skeletal hyperostosis)      1. CHF: Related to transient prosthetic mitral valve dysfunction.  Other than mild swelling of the right ankle she does not have any overt symptoms or signs of heart failure.  Her functional status is primarily limited by her spine problems. 2. Prosthetic MV stenosis: Transient, related to thrombotic leaflet restriction.  Resolved after transitioning to warfarin anticoagulation.  Recheck echo later this month. 3. S/p AVR: Aortic valve biological prosthesis is functioning normally.  She is aware of the need for endocarditis prophylaxis. 4. 2nd deg AV block: Despite attempts at improving device timing, has 100% ventricular pacing. 5. Atrial fibrillation: She does not want to undergo cardioversion.  She does not really have much in the way of symptoms from the atrial fibrillation other than occasional very brief palpitations. Rate control is not an issue due to high-grade AV block. She has mitral valve disease and is at high risk for embolic events without anticoagulation.  Compliant with warfarin, troubled by heavy bruising, but no serious bleeding.   History of syncope on higher doses of bisoprolol related to orthostatic hypotension.  Tolerating metoprolol well.   6.  COPD: No recent episodes of exacerbation. 7.  HLP: All lipid parameters are within desirable range on current medical regimen. 8. Pacemaker: Normal device function. 9. DISH: Spinal injection last year was of minimal benefit.  COVID-19 Education: The signs and symptoms of COVID-19 were discussed with the patient and how to seek care for testing (follow up with PCP or arrange E-visit).  The importance of social distancing was discussed today.  Time:   Today, I have spent 20 minutes  with the patient with telehealth technology  discussing the above problems.     Medication Adjustments/Labs and Tests Ordered: Current medicines are reviewed at length with the patient today.  Concerns regarding medicines are outlined above.   Tests Ordered: Orders Placed This Encounter  Procedures  . EKG 12-Lead    Medication Changes: No orders of the defined types were placed in this encounter.  Patient Instructions  Medication Instructions:  No changes *If you need a refill on your cardiac medications before your next appointment, please call your pharmacy*   Lab Work: None ordered If you have labs (blood work) drawn today and your tests are completely normal, you will receive your results only by: Marland Kitchen MyChart Message (if you have MyChart) OR . A paper copy in the mail If you have any lab test that is abnormal or we need to change your treatment, we will call you to review the results.   Testing/Procedures: None ordered   Follow-Up: At [facility], you and your health needs are our priority.  As part of our continuing mission to provide you with exceptional heart care, we have created designated Provider Care Teams.  These Care Teams include your primary Cardiologist (physician) and Advanced Practice Providers (APPs -  Physician Assistants and Nurse Practitioners) who all work together to provide you with the care you need, when you need it.  We recommend signing up for the patient portal called "MyChart".  Sign up information is provided on this After Visit Summary.  MyChart is used to connect with patients for Virtual Visits (Telemedicine).  Patients are able to view lab/test results, encounter notes, upcoming appointments, etc.  Non-urgent messages can be sent to your provider as well.   To learn more about what you can do with MyChart, go to [email or web address].    Your next appointment:   12 month(s)  The format for your next appointment:   In Person  Provider:   You may see Sanda Klein, MD or one  of the following Advanced Practice Providers on your designated Care Team:    Almyra Deforest, PA-C  Fabian Sharp, [place] or   Roby Lofts, [place]     Follow Up:  ECHO scheduled for November, with subsequent office follow up  Signed, Sanda Klein, MD  02/17/2020 5:12 PM    [place]

## 2020-02-17 NOTE — Patient Instructions (Signed)
Medication Instructions:  No changes *If you need a refill on your cardiac medications before your next appointment, please call your pharmacy*   Lab Work: None ordered If you have labs (blood work) drawn today and your tests are completely normal, you will receive your results only by: . MyChart Message (if you have MyChart) OR . A paper copy in the mail If you have any lab test that is abnormal or we need to change your treatment, we will call you to review the results.   Testing/Procedures: None ordered   Follow-Up: At [facility], you and your health needs are our priority.  As part of our continuing mission to provide you with exceptional heart care, we have created designated Provider Care Teams.  These Care Teams include your primary Cardiologist (physician) and Advanced Practice Providers (APPs -  Physician Assistants and Nurse Practitioners) who all work together to provide you with the care you need, when you need it.  We recommend signing up for the patient portal called "MyChart".  Sign up information is provided on this After Visit Summary.  MyChart is used to connect with patients for Virtual Visits (Telemedicine).  Patients are able to view lab/test results, encounter notes, upcoming appointments, etc.  Non-urgent messages can be sent to your provider as well.   To learn more about what you can do with MyChart, go to [email or web address].    Your next appointment:   12 month(s)  The format for your next appointment:   In Person  Provider:   You may see Mihai Croitoru, MD or one of the following Advanced Practice Providers on your designated Care Team:    Hao Meng, PA-C  Angela Duke, PA-C or   Krista Kroeger, PA-C  

## 2020-02-18 ENCOUNTER — Ambulatory Visit: Payer: Medicare Other

## 2020-02-18 NOTE — Progress Notes (Signed)
Noted  

## 2020-02-21 ENCOUNTER — Other Ambulatory Visit: Payer: Self-pay | Admitting: Family Medicine

## 2020-02-21 ENCOUNTER — Other Ambulatory Visit: Payer: Self-pay | Admitting: Internal Medicine

## 2020-03-03 ENCOUNTER — Other Ambulatory Visit (HOSPITAL_COMMUNITY): Payer: Medicare Other

## 2020-03-03 ENCOUNTER — Telehealth: Payer: Self-pay

## 2020-03-03 ENCOUNTER — Ambulatory Visit: Payer: Medicare Other

## 2020-03-03 NOTE — Telephone Encounter (Signed)
Pt has been scheduled for virtual visit and will go through mychart to access visit.

## 2020-03-03 NOTE — Telephone Encounter (Signed)
Recommend virtual visit for tomorrow as we are still not bringing respiratory pts into office. I don't think she needs covid testing given she completed covid vaccines.

## 2020-03-03 NOTE — Telephone Encounter (Signed)
Patient contacted the office and states that she has an appt with Larene Beach for coumadin clinic today at 4:00. Patient states she has sx of a sinus infection - with cough & nasal drainage. Patient was asking for an appt with Dr. Illene Bolus the time that her coumadin appt is today - I advised that with her sx, we could not bring her in the office. Patient denies fever or shortness of breath.  Patient states she has lots of other appts this week, and states it would be hard for her to make time for a virtual appt with Dr. Darnell Level, and she is wondering that since Dr. Darnell Level knows her history, if he will just call in an rx for an antibiotic or prednisone for her?  Larene Beach - does coumadin clinic appt need to be rescheduled for today also?

## 2020-03-03 NOTE — Telephone Encounter (Signed)
Pt c/o dry cough, scratchy throat and nasal drainage since last night. Pt denies any other symptoms. Pt reports this has happened in the past and if not treated it turns into bronchitis. Advised pt a msg has been sent to Dr. Darnell Level and we are awaiting his response and this office will f/u. Pt verbalized understanding.

## 2020-03-03 NOTE — Telephone Encounter (Signed)
Pt called back and said do you think she needs a covid test if so she needs to be making an appointment? She really would like a call back as soon as possible.

## 2020-03-04 ENCOUNTER — Encounter: Payer: Self-pay | Admitting: Family Medicine

## 2020-03-04 ENCOUNTER — Telehealth: Payer: Self-pay

## 2020-03-04 ENCOUNTER — Telehealth (INDEPENDENT_AMBULATORY_CARE_PROVIDER_SITE_OTHER): Payer: Medicare Other | Admitting: Family Medicine

## 2020-03-04 VITALS — BP 130/85 | HR 70 | Temp 98.0°F | Ht 66.0 in

## 2020-03-04 DIAGNOSIS — J449 Chronic obstructive pulmonary disease, unspecified: Secondary | ICD-10-CM

## 2020-03-04 DIAGNOSIS — Z7901 Long term (current) use of anticoagulants: Secondary | ICD-10-CM

## 2020-03-04 DIAGNOSIS — Z5181 Encounter for therapeutic drug level monitoring: Secondary | ICD-10-CM | POA: Insufficient documentation

## 2020-03-04 DIAGNOSIS — J441 Chronic obstructive pulmonary disease with (acute) exacerbation: Secondary | ICD-10-CM

## 2020-03-04 MED ORDER — DOXYCYCLINE HYCLATE 100 MG PO TABS: 100.0000 mg | ORAL_TABLET | Freq: Two times a day (BID) | ORAL | 0 refills | Status: DC

## 2020-03-04 MED ORDER — PREDNISONE 20 MG PO TABS
ORAL_TABLET | ORAL | 0 refills | Status: DC
Start: 2020-03-04 — End: 2020-03-10

## 2020-03-04 NOTE — Assessment & Plan Note (Signed)
Meets criteria for COPD exacerbation - will treat with short prednisone taper and 1 wk doxy course. Discussed albuterol inh use PRN and rec start plain mucinex with plenty of water to break up mucous. Update if not improving with treatment. Pt agrees with plan.  Doubt COVID given she completed vaccination series and has not been around sick contacts.

## 2020-03-04 NOTE — Telephone Encounter (Signed)
03/04/20  Diane Bush, MD  Randall An, RN  Fyi - see coumadin instructions. Can you call and schedule INR check for next week? Thanks!  Medications  . predniSONE (DELTASONE) 20 MG tablet    Sig: Take two tablets daily for 3 days followed by one tablet daily for 3 days    Dispense:  9 tablet    Refill:  0  . doxycycline (VIBRA-TABS) 100 MG tablet    Sig: Take 1 tablet (100 mg total) by mouth 2 (two) times daily.    Dispense:  14 tablet    Refill:  0   Anticoagulation management encounter       She was due this week for check. Reviewed normal coumadin dosing.  New regimen while on doxycycline: advised to take coumadin 2.5mg  daily until Monday then take 5mg  on Monday and return to previous dosing on Tuesday.  I will ask Larene Beach to call pt to schedule INR check sometime next week.       Schedule pt for 6/1 at 330. Advised pt if any symptoms worsen or she develops any new symptoms to contact office and advised there are nurses answering the phones when the clinic is not open. Pt verbalized understanding.

## 2020-03-04 NOTE — Assessment & Plan Note (Signed)
She was due this week for check. Reviewed normal coumadin dosing.  New regimen while on doxycycline: advised to take coumadin 2.5mg  daily until Monday then take 5mg  on Monday and return to previous dosing on Tuesday.  I will ask Larene Beach to call pt to schedule INR check sometime next week.

## 2020-03-04 NOTE — Progress Notes (Signed)
Virtual visit completed through MyChart, a video enabled telemedicine application. Due to national recommendations of social distancing due to COVID-19, a virtual visit is felt to be most appropriate for this patient at this time. Reviewed limitations, risks, security and privacy concerns of performing a virtual visit and the availability of in person appointments. I also reviewed that there may be a patient responsible charge related to this service. The patient agreed to proceed.   Patient location: home Provider location: [place] at [facility], office Persons participating in this virtual visit: patient, provider  If any vitals were documented, they were collected by patient at home unless specified below.    BP 130/85   Pulse 70   Temp 98 F (36.7 C)   Ht 5\' 6"  (1.676 m)   SpO2 94%   BMI 22.60 kg/m    CC: cough, congestion Subjective:    Patient ID: [name], female    DOB: [birth year], 74 y.o.   MRN: [id number]  HPI: [name] is a 74 y.o. female presenting on 03/04/2020 for Cough (C/o cough, wheezing, sore throat, runny nose, HA and hoarseness. Sxs started 03/01/20.  H/o bronchitis.  Tried Tylenol and Nyquil. )   4d h/o sore throat that progressed to post nasal drainage, headache, rhinorrhea and now productive cough with wheezing. Tends to get bronchitis. H/o pneumonia in the past as well. Some mild dyspnea with exertion. Increased cough with increased sputum production. Having coughing fits.   So far has tried nyquil for night time with benefit.   No fevers/chills, abd pain, nausea or diarrhea. No loss of taste or smell.  She did complete covid series 11/2019.  No known sick contacts.   Known asthma and COPD - continue singulair, breo, spiriva, albuterol as needed (first used this weekend - had not needed for months to years prior).   Lab Results  Component Value Date   INR 2.2 02/06/2020   INR 1.5 (A) 01/31/2020   INR 2.3 01/21/2020  Normally  takes coumadin 5mg  daily with 7.5mg  on Mondays. Advised to take coumadin 2.5mg  daily until Monday then take 5mg  on Monday and return to previous dosing on Tuesday.       Relevant past medical, surgical, family and social history reviewed and updated as indicated. Interim medical history since our last visit reviewed. Allergies and medications reviewed and updated. Outpatient Medications Prior to Visit  Medication Sig Dispense Refill  . acetaminophen (TYLENOL) 500 MG tablet Take 2 tablets (1,000 mg total) by mouth daily.    Marland Kitchen albuterol (VENTOLIN HFA) 108 (90 Base) MCG/ACT inhaler Inhale 2 puffs into the lungs every 6 (six) hours as needed for wheezing. 18 g 6  . amoxicillin (AMOXIL) 500 MG capsule TAKE 4 CAPSULES BY MOUTH 30-60 MINUTES PRIOR TO APPOINTMENT 4 capsule 12  . Biotin 1000 MCG tablet Take 1,000 mcg by mouth daily.    Marland Kitchen BREO ELLIPTA 200-25 MCG/INH AEPB INHALE 1 PUFF INTO THE LUNGS EVERY DAY 60 each 3  . Calcium Carb-Cholecalciferol (CALCIUM 600+D) 600-800 MG-UNIT TABS Take 1 tablet by mouth every evening.    . Cholecalciferol (VITAMIN D) 2000 UNITS CAPS Take 2,000 Units by mouth every evening.     . docusate sodium (COLACE) 100 MG capsule Take 200 mg by mouth every evening.    . furosemide (LASIX) 40 MG tablet Take 1 tablet (40 mg total) by mouth daily. 30 tablet 11  . ipratropium-albuterol (DUONEB) 0.5-2.5 (3) MG/3ML SOLN Take 3 mLs by nebulization every 6 (  six) hours as needed. (Patient taking differently: Take 3 mLs by nebulization every 6 (six) hours as needed (asthma). ) 360 mL 0  . lovastatin (MEVACOR) 40 MG tablet TAKE 1 TABLET(40 MG) BY MOUTH AT BEDTIME 90 tablet 3  . montelukast (SINGULAIR) 10 MG tablet TAKE 1 TABLET(10 MG) BY MOUTH AT BEDTIME 30 tablet 3  . Multiple Vitamins-Minerals (PRESERVISION AREDS 2 PO) Take 2 capsules by mouth every evening.     . nitrofurantoin, macrocrystal-monohydrate, (MACROBID) 100 MG capsule Take 100 mg by mouth every evening.     .  polycarbophil (FIBERCON) 625 MG tablet Take 625 mg by mouth every evening.     . polyethylene glycol (MIRALAX / GLYCOLAX) packet Take 17 g by mouth daily. 14 each 0  . potassium chloride (K-DUR) 10 MEQ tablet Take 2 tablets (20 mEq total) by mouth daily. 60 tablet 11  . SPIRIVA HANDIHALER 18 MCG inhalation capsule INHALE THE CONTENTS OF 1 CAPSULE VIA INHALATION DEVICE EVERY DAY 30 capsule 1  . Talc (ZEASORB EX) Apply 1 application topically daily as needed (yeast infection under stomach). Under skin folds (abdomen)    . TOVIAZ 8 MG TB24 tablet Take 8 mg by mouth every evening.   11  . traMADol (ULTRAM) 50 MG tablet Take 1 tablet (50 mg total) by mouth 2 (two) times daily as needed for moderate pain. 20 tablet 0  . vitamin B-12 (CYANOCOBALAMIN) 500 MCG tablet Take 500 mcg by mouth every other day.     . warfarin (COUMADIN) 5 MG tablet TAKE 1-2 TABLETS DAILY AS DIRECTED BY COUMADIN CLINIC 60 tablet 5  . Zinc 50 MG CAPS Take 1 capsule (50 mg total) by mouth daily.  0   Facility-Administered Medications Prior to Visit  Medication Dose Route Frequency Provider Last Rate Last Admin  . ondansetron (ZOFRAN) 4 mg in sodium chloride 0.9 % 50 mL IVPB  4 mg Intravenous Q6H PRN Kristeen Miss, MD         Per HPI unless specifically indicated in ROS section below Review of Systems Objective:  BP 130/85   Pulse 70   Temp 98 F (36.7 C)   Ht 5\' 6"  (1.676 m)   SpO2 94%   BMI 22.60 kg/m   Wt Readings from Last 3 Encounters:  02/17/20 140 lb (63.5 kg)  01/22/20 137 lb 3.2 oz (62.2 kg)  12/19/19 139 lb 8 oz (63.3 kg)       Physical exam: Gen: alert, NAD, not ill appearing Pulm: speaks in complete sentences without increased work of breathing Psych: normal mood, normal thought content      Results for orders placed or performed in visit on 02/06/20  POCT INR  Result Value Ref Range   INR 2.2 2.0 - 3.0   *Note: Due to a large number of results and/or encounters for the requested time period,  some results have not been displayed. A complete set of results can be found in Results Review.   Assessment & Plan:   Problem List Items Addressed This Visit    COPD exacerbation ([place]) - Primary    Meets criteria for COPD exacerbation - will treat with short prednisone taper and 1 wk doxy course. Discussed albuterol inh use PRN and rec start plain mucinex with plenty of water to break up mucous. Update if not improving with treatment. Pt agrees with plan.  Doubt COVID given she completed vaccination series and has not been around sick contacts.       Relevant  Medications   predniSONE (DELTASONE) 20 MG tablet   COPD (chronic obstructive pulmonary disease) (HCC)   Relevant Medications   predniSONE (DELTASONE) 20 MG tablet   Anticoagulation management encounter    She was due this week for check. Reviewed normal coumadin dosing.  New regimen while on doxycycline: advised to take coumadin 2.5mg  daily until Monday then take 5mg  on Monday and return to previous dosing on Tuesday.  I will ask Larene Beach to call pt to schedule INR check sometime next week.           Meds ordered this encounter  Medications  . predniSONE (DELTASONE) 20 MG tablet    Sig: Take two tablets daily for 3 days followed by one tablet daily for 3 days    Dispense:  9 tablet    Refill:  0  . doxycycline (VIBRA-TABS) 100 MG tablet    Sig: Take 1 tablet (100 mg total) by mouth 2 (two) times daily.    Dispense:  14 tablet    Refill:  0   No orders of the defined types were placed in this encounter.   I discussed the assessment and treatment plan with the patient. The patient was provided an opportunity to ask questions and all were answered. The patient agreed with the plan and demonstrated an understanding of the instructions. The patient was advised to call back or seek an in-person evaluation if the symptoms worsen or if the condition fails to improve as anticipated.  Follow up plan: Return if symptoms worsen or  fail to improve.  Ria Bush, MD

## 2020-03-05 ENCOUNTER — Ambulatory Visit: Payer: Medicare Other

## 2020-03-10 ENCOUNTER — Ambulatory Visit (INDEPENDENT_AMBULATORY_CARE_PROVIDER_SITE_OTHER)
Admission: RE | Admit: 2020-03-10 | Discharge: 2020-03-10 | Disposition: A | Discharge status: Home / Self Care | Payer: Medicare Other | Source: Ambulatory Visit | Attending: Family Medicine | Admitting: Family Medicine

## 2020-03-10 ENCOUNTER — Ambulatory Visit (INDEPENDENT_AMBULATORY_CARE_PROVIDER_SITE_OTHER): Payer: Medicare Other | Admitting: Family Medicine

## 2020-03-10 ENCOUNTER — Ambulatory Visit (INDEPENDENT_AMBULATORY_CARE_PROVIDER_SITE_OTHER): Payer: Medicare Other

## 2020-03-10 ENCOUNTER — Telehealth: Payer: Self-pay | Admitting: Family Medicine

## 2020-03-10 ENCOUNTER — Encounter: Payer: Self-pay | Admitting: Family Medicine

## 2020-03-10 ENCOUNTER — Other Ambulatory Visit: Payer: Self-pay

## 2020-03-10 VITALS — BP 110/76 | HR 72 | Temp 98.2°F | Ht 66.0 in | Wt 142.4 lb

## 2020-03-10 DIAGNOSIS — J449 Chronic obstructive pulmonary disease, unspecified: Secondary | ICD-10-CM | POA: Diagnosis not present

## 2020-03-10 DIAGNOSIS — R05 Cough: Secondary | ICD-10-CM

## 2020-03-10 DIAGNOSIS — Z5181 Encounter for therapeutic drug level monitoring: Secondary | ICD-10-CM

## 2020-03-10 DIAGNOSIS — Z952 Presence of prosthetic heart valve: Secondary | ICD-10-CM

## 2020-03-10 DIAGNOSIS — Z7901 Long term (current) use of anticoagulants: Secondary | ICD-10-CM

## 2020-03-10 DIAGNOSIS — R059 Cough, unspecified: Secondary | ICD-10-CM

## 2020-03-10 LAB — POCT INR: INR: 2.1 (ref 2.0–3.0)

## 2020-03-10 MED ORDER — LEVOFLOXACIN 500 MG PO TABS: 500.0000 mg | ORAL_TABLET | Freq: Every day | ORAL | 0 refills | Status: DC

## 2020-03-10 NOTE — Progress Notes (Signed)
Pt still having a lot of chest congestion and has been scheduled with Dr. Darnell Level.

## 2020-03-10 NOTE — Patient Instructions (Addendum)
Labs and xray today. I have sent a stronger antibiotic to the pharmacy for you.  I want you to go tomorrow for covid test to [facility] at below address.  If worsening symptoms, go to ER.   [place], [place], [address] Wednesday March 11, 2020 Arrive by 8:00 AM Starts at 8:00 AM (15 minutes)   [place] [phone]  This visit is at the [place], [place], [place] [address] Someone will direct you where to park. For scheduling questions, or to cancel please call [phone].

## 2020-03-10 NOTE — Assessment & Plan Note (Addendum)
Ongoing progressive cough with dyspnea not improved on doxy/prednisone course.  Check CXR today - read as possible bibasilar infiltrates concerning for progressive scarring vs infection. Will broaden coverage with levaquin 500mg  antibiotic. ?atypical infection vs mycobacterium infection. Will check labwork today including BNP, CBC (anticipate elevated WBC with recent steroid use) and will send for COVID nasal swab tomorrow am. I did ask her to continue cutting coumadin in half while on fluoroquinolone.  I will also ask her to stop nitrofurantoin at this time.  Not consistent with PE or CHF exacerbation.  May need sooner pulm f/u pending how she does.

## 2020-03-10 NOTE — Telephone Encounter (Signed)
plz call on Wednesday - how is she feeling, how is breathing? I'd like her to hold macrobid (nitrofurantoin) at this time while ongoing cough, dyspnea as longterm use can lead to lung issues.

## 2020-03-10 NOTE — Progress Notes (Signed)
This visit was conducted in person.  BP 110/76   Pulse 72   Temp 98.2 F (36.8 C)   Ht 5\' 6"  (1.676 m)   Wt 142 lb 7 oz (64.6 kg)   SpO2 94% Comment: RA  BMI 22.99 kg/m    CC: f/u cough, congestion Subjective:    Patient ID: [name], female    DOB: 09-27-1946, 74 y.o.   MRN: [id number]  HPI: [name] is a 74 y.o. female presenting on 03/10/2020 for Follow-up (Here for chest congestion f/u.  Now has cough. )   See prior note for details. Seen for virtual visit 03/04/2020 with dx COPD exacerbation treated with doxy/prednisone course.   No significant improvement noted along with worsening coughing fits, worse when talking. Ongoing wheezing with exertional dyspnea. Did not feel much better while on higher prednisone dose.   Denies fevers. She notes increased diaphoresis. No chest pain, but she's having chest soreness from cough. No leg swelling. No GI symptoms. No choking symptoms.   She did complete covid vaccination series 11/2019.   Known asthma and COPD - continues singulair, breo, spiriva, albuterol as needed  ?of mycobacterium infection based on previous CT scans.      Relevant past medical, surgical, family and social history reviewed and updated as indicated. Interim medical history since our last visit reviewed. Allergies and medications reviewed and updated. Outpatient Medications Prior to Visit  Medication Sig Dispense Refill  . acetaminophen (TYLENOL) 500 MG tablet Take 2 tablets (1,000 mg total) by mouth daily.    Marland Kitchen albuterol (VENTOLIN HFA) 108 (90 Base) MCG/ACT inhaler Inhale 2 puffs into the lungs every 6 (six) hours as needed for wheezing. 18 g 6  . amoxicillin (AMOXIL) 500 MG capsule TAKE 4 CAPSULES BY MOUTH 30-60 MINUTES PRIOR TO APPOINTMENT 4 capsule 12  . Biotin 1000 MCG tablet Take 1,000 mcg by mouth daily.    Marland Kitchen BREO ELLIPTA 200-25 MCG/INH AEPB INHALE 1 PUFF INTO THE LUNGS EVERY DAY 60 each 3  . Calcium Carb-Cholecalciferol (CALCIUM  600+D) 600-800 MG-UNIT TABS Take 1 tablet by mouth every evening.    . Cholecalciferol (VITAMIN D) 2000 UNITS CAPS Take 2,000 Units by mouth every evening.     . docusate sodium (COLACE) 100 MG capsule Take 200 mg by mouth every evening.    . furosemide (LASIX) 40 MG tablet Take 1 tablet (40 mg total) by mouth daily. 30 tablet 11  . ipratropium-albuterol (DUONEB) 0.5-2.5 (3) MG/3ML SOLN Take 3 mLs by nebulization every 6 (six) hours as needed. (Patient taking differently: Take 3 mLs by nebulization every 6 (six) hours as needed (asthma). ) 360 mL 0  . lovastatin (MEVACOR) 40 MG tablet TAKE 1 TABLET(40 MG) BY MOUTH AT BEDTIME 90 tablet 3  . montelukast (SINGULAIR) 10 MG tablet TAKE 1 TABLET(10 MG) BY MOUTH AT BEDTIME 30 tablet 3  . Multiple Vitamins-Minerals (PRESERVISION AREDS 2 PO) Take 2 capsules by mouth every evening.     . nitrofurantoin, macrocrystal-monohydrate, (MACROBID) 100 MG capsule Take 100 mg by mouth every evening.     . polycarbophil (FIBERCON) 625 MG tablet Take 625 mg by mouth every evening.     . polyethylene glycol (MIRALAX / GLYCOLAX) packet Take 17 g by mouth daily. 14 each 0  . potassium chloride (K-DUR) 10 MEQ tablet Take 2 tablets (20 mEq total) by mouth daily. 60 tablet 11  . SPIRIVA HANDIHALER 18 MCG inhalation capsule INHALE THE CONTENTS OF 1 CAPSULE VIA INHALATION  DEVICE EVERY DAY 30 capsule 1  . Talc (ZEASORB EX) Apply 1 application topically daily as needed (yeast infection under stomach). Under skin folds (abdomen)    . TOVIAZ 8 MG TB24 tablet Take 8 mg by mouth every evening.   11  . traMADol (ULTRAM) 50 MG tablet Take 1 tablet (50 mg total) by mouth 2 (two) times daily as needed for moderate pain. 20 tablet 0  . vitamin B-12 (CYANOCOBALAMIN) 500 MCG tablet Take 500 mcg by mouth every other day.     . warfarin (COUMADIN) 5 MG tablet TAKE 1-2 TABLETS DAILY AS DIRECTED BY COUMADIN CLINIC 60 tablet 5  . Zinc 50 MG CAPS Take 1 capsule (50 mg total) by mouth daily.  0    . doxycycline (VIBRA-TABS) 100 MG tablet Take 1 tablet (100 mg total) by mouth 2 (two) times daily. 14 tablet 0  . predniSONE (DELTASONE) 20 MG tablet Take two tablets daily for 3 days followed by one tablet daily for 3 days 9 tablet 0   Facility-Administered Medications Prior to Visit  Medication Dose Route Frequency Provider Last Rate Last Admin  . ondansetron (ZOFRAN) 4 mg in sodium chloride 0.9 % 50 mL IVPB  4 mg Intravenous Q6H PRN Kristeen Miss, MD         Per HPI unless specifically indicated in ROS section below Review of Systems Objective:  BP 110/76   Pulse 72   Temp 98.2 F (36.8 C)   Ht 5\' 6"  (1.676 m)   Wt 142 lb 7 oz (64.6 kg)   SpO2 94% Comment: RA  BMI 22.99 kg/m   Wt Readings from Last 3 Encounters:  03/10/20 142 lb 7 oz (64.6 kg)  02/17/20 140 lb (63.5 kg)  01/22/20 137 lb 3.2 oz (62.2 kg)      Physical Exam Vitals and nursing note reviewed.  Constitutional:      Appearance: Normal appearance.     Comments: Tired appearing  Cardiovascular:     Rate and Rhythm: Normal rate and regular rhythm.     Pulses: Normal pulses.     Heart sounds: Murmur present.     Comments: Able to speak in complete sentences, able to walk without noted dyspnea Pulmonary:     Effort: Pulmonary effort is normal. No respiratory distress.     Breath sounds: Rales (diffuse throughout) present. No wheezing.  Musculoskeletal:     Right lower leg: No edema.     Left lower leg: No edema.  Neurological:     Mental Status: She is alert.  Psychiatric:        Mood and Affect: Mood normal.        Behavior: Behavior normal.       Lab Results  Component Value Date   INR 2.1 03/10/2020   INR 2.2 02/06/2020   INR 1.5 (A) 01/31/2020    DG Chest 2 View CLINICAL DATA:  Cough and congestion.  History of colon cancer.  EXAM: CHEST - 2 VIEW  COMPARISON:  CTs including 04/22/2019. Most recent chest radiograph 01/22/2018  FINDINGS: Hyperinflation. Osteopenia. Dual lead pacer. Prior  median sternotomy. No pleural effusion or pneumothorax. Bibasilar airspace disease. On the right, this is progressive or new laterally. On the left, felt to be new or progressive.  IMPRESSION: Bibasilar airspace disease, including new and progressive opacities as detailed above. Although these could represent areas of progressive scarring, infection or aspiration are suspected.  Underlying hyperinflation.  Electronically Signed   By: Abigail Miyamoto  [name]   On: 03/10/2020 17:27   Assessment & Plan:  This visit occurred during the SARS-CoV-2 public health emergency.  Safety protocols were in place, including screening questions prior to the visit, additional usage of staff PPE, and extensive cleaning of exam room while observing appropriate contact time as indicated for disinfecting solutions.   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  Check CXR today - read as possible bibasilar infiltrates concerning for progressive scarring vs infection. Will broaden coverage with levaquin 500mg  antibiotic. ?atypical infection vs mycobacterium infection. Will check labwork today including BNP, CBC (anticipate elevated WBC with recent steroid use) and will send for COVID nasal swab tomorrow am. I did ask her to continue cutting coumadin in half while on fluoroquinolone.  I will also ask her to stop nitrofurantoin at this time.  Not consistent with PE or CHF exacerbation.  May need sooner pulm f/u pending how she does.       Relevant Orders   DG Chest 2 View (Completed)   CBC with Differential/Platelet   Basic metabolic panel   Brain natriuretic peptide   COPD (chronic obstructive pulmonary disease) (HCC)   Anticoagulation management encounter    Continue 1/2 dose coumadin as we're starting levaquin 7d course. F/u coumadin clinic next week.            Meds ordered this encounter  Medications  . levofloxacin (LEVAQUIN) 500 MG tablet    Sig: Take 1 tablet (500 mg total) by mouth daily.    Dispense:  7 tablet    Refill:  0   Orders Placed This Encounter  Procedures  . DG Chest 2 View    Standing Status:   Future    Number of Occurrences:   1    Standing Expiration Date:   03/10/2021    Order Specific Question:   Reason for Exam (SYMPTOM  OR DIAGNOSIS REQUIRED)    Answer:   cough, congestion    Order Specific Question:   Preferred imaging location?    Answer:   [facility]    Order Specific Question:   Radiology Contrast Protocol - do NOT remove file path    Answer:   [email or web address]  . CBC with Differential/Platelet  . Basic metabolic panel  . Brain natriuretic peptide    Patient Instructions  Labs and xray today. I have sent a stronger antibiotic to the pharmacy for you.  I want you to go tomorrow for covid test to [facility] at below address.  If worsening symptoms, go to ER.   [place], [place], [address] Wednesday March 11, 2020 Arrive by 8:00 AM Starts at 8:00 AM (15 minutes)   [place] [phone]  This visit is at the [place], [place], [place] [address] Someone will direct you where to park. For scheduling questions, or to cancel please call [phone].   Follow up plan: No follow-ups on file.  Ria Bush, MD

## 2020-03-10 NOTE — Patient Instructions (Signed)
Pre visit review using our clinic review tool, if applicable. No additional management support is needed unless otherwise documented below in the visit note. 

## 2020-03-10 NOTE — Assessment & Plan Note (Addendum)
Continue 1/2 dose coumadin as we're starting levaquin 7d course. F/u coumadin clinic next week.

## 2020-03-11 ENCOUNTER — Ambulatory Visit: Payer: Medicare Other | Attending: Internal Medicine

## 2020-03-11 DIAGNOSIS — Z20822 Contact with and (suspected) exposure to covid-19: Secondary | ICD-10-CM

## 2020-03-11 LAB — CBC WITH DIFFERENTIAL/PLATELET
Basophils Absolute: 0.1 10*3/uL (ref 0.0–0.1)
Basophils Relative: 0.5 % (ref 0.0–3.0)
Eosinophils Absolute: 0 10*3/uL (ref 0.0–0.7)
Eosinophils Relative: 0 % (ref 0.0–5.0)
HCT: 42.9 % (ref 36.0–46.0)
Hemoglobin: 14.8 g/dL (ref 12.0–15.0)
Lymphocytes Relative: 3.6 % — ABNORMAL LOW (ref 12.0–46.0)
Lymphs Abs: 0.4 10*3/uL — ABNORMAL LOW (ref 0.7–4.0)
MCHC: 34.5 g/dL (ref 30.0–36.0)
MCV: 108 fl — ABNORMAL HIGH (ref 78.0–100.0)
Monocytes Absolute: 0.9 10*3/uL (ref 0.1–1.0)
Monocytes Relative: 7.6 % (ref 3.0–12.0)
Neutro Abs: 10.5 10*3/uL — ABNORMAL HIGH (ref 1.4–7.7)
Neutrophils Relative %: 88.3 % — ABNORMAL HIGH (ref 43.0–77.0)
Platelets: 135 10*3/uL — ABNORMAL LOW (ref 150.0–400.0)
RBC: 3.97 Mil/uL (ref 3.87–5.11)
RDW: 13.9 % (ref 11.5–15.5)
WBC: 11.9 10*3/uL — ABNORMAL HIGH (ref 4.0–10.5)

## 2020-03-11 LAB — BRAIN NATRIURETIC PEPTIDE: Pro B Natriuretic peptide (BNP): 379 pg/mL — ABNORMAL HIGH (ref 0.0–100.0)

## 2020-03-11 LAB — BASIC METABOLIC PANEL
BUN: 20 mg/dL (ref 6–23)
CO2: 33 mEq/L — ABNORMAL HIGH (ref 19–32)
Calcium: 10.2 mg/dL (ref 8.4–10.5)
Chloride: 95 mEq/L — ABNORMAL LOW (ref 96–112)
Creatinine, Ser: 0.68 mg/dL (ref 0.40–1.20)
GFR: 84.42 mL/min (ref >60.00)
Glucose, Bld: 120 mg/dL — ABNORMAL HIGH (ref 70–99)
Potassium: 4.3 mEq/L (ref 3.5–5.1)
Sodium: 133 mEq/L — ABNORMAL LOW (ref 135–145)

## 2020-03-11 NOTE — Telephone Encounter (Signed)
Spoke with pt asking about her breathing.  Says her breathing seems to be better.  And she is not coughing as much.  Says she just feels "run down".  I relayed Dr. Darnell Level message.  Pt verbalizes understanding.  States she has already taken nitrofurantoin today but won't take anymore.

## 2020-03-12 LAB — NOVEL CORONAVIRUS, NAA: SARS-CoV-2, NAA: NOT DETECTED

## 2020-03-12 LAB — SARS-COV-2, NAA 2 DAY TAT

## 2020-03-16 ENCOUNTER — Encounter: Payer: Self-pay | Admitting: Family Medicine

## 2020-03-16 ENCOUNTER — Encounter: Payer: Medicare Other | Admitting: Cardiovascular Disease

## 2020-03-19 ENCOUNTER — Ambulatory Visit (INDEPENDENT_AMBULATORY_CARE_PROVIDER_SITE_OTHER): Payer: Medicare Other

## 2020-03-19 ENCOUNTER — Other Ambulatory Visit: Payer: Self-pay

## 2020-03-19 ENCOUNTER — Telehealth: Payer: Self-pay

## 2020-03-19 DIAGNOSIS — Z7901 Long term (current) use of anticoagulants: Secondary | ICD-10-CM

## 2020-03-19 LAB — POCT INR: INR: 1.8 — AB (ref 2.0–3.0)

## 2020-03-19 NOTE — Patient Instructions (Addendum)
Pre visit review using our clinic review tool, if applicable. No additional management support is needed unless otherwise documented below in the visit note.  Increase dose today to 7.5mg  then continue 5mg  daily except 7.5mg  on Mondays. Recheck in 3 wks.

## 2020-03-19 NOTE — Telephone Encounter (Signed)
Pt in today INR check and revealed that yesterday she had two episodes of varying vision loss in her left eye. She reports one occurrence vision in L eye was completely black and the other occurrence vision was only partially covered. Pt denies any loss of vision today. Pt denies any HA, chest pain, loss of balance, loss of speech or arm or leg weakness. Pt reports she is feeling slightly better concerning the recent cough she has been treated for but still has some chest congestion. Pt reports she has discussed the intermittent loss of vision with cardiology in the past and they said it could have been a clot that broke loose somewhere but did not advise her of any changes. Advised pt to contact cardiology to see if she needs an apt.  She also reported she had seen her opthamologist concerning the intermittent vision loss and they did not see any abnormality and advised she f/u with cardiology. Pt said she will contact cardiology today. Advised if she had any symptoms mentioned above or any changes to call EMS. Pt verbalized understanding.   FYI, INR today was 1.8

## 2020-03-19 NOTE — Telephone Encounter (Signed)
Pt called to report note from cardiology. Note below  Croitoru, Dani Gobble, MD to [place], [place]     03/19/20 4:36 PM Hi, Mrs. Eakes I'm sure the vision incident was very scary. 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 It's important to get your INR back above 2.0 ASAP. I would recommend rechecking it tomorrow and getting Lovenox shots to cover the weekend if it is still under 2. Can Dr. Genia Plants office check again tomorrow? If needs be, I can bring you in to our Coumadin clinic. I'd be reluctant to stop your coumadin for the cystoscopy with this recent event. At a minimum I would do Lovenox "bridging" around the time of cystoscopy. Who is your Urologist? Dr. Loletha Grayer   Pt has been scheduled for 10am tomorrow for INR check.

## 2020-03-19 NOTE — Telephone Encounter (Signed)
Hi, Mrs. Rockwood I'm sure the vision incident was very scary. 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 It's important to get your INR back above 2.0 ASAP. I would recommend rechecking it tomorrow and getting Lovenox shots to cover the weekend if it is still under 2. Can Dr. Genia Plants office check the INR again tomorrow? If needs be, I can bring you in to our Coumadin clinic to see one of our pharmacists. I'd be reluctant to stop your coumadin for the cystoscopy with this recent event. At a minimum I would do Lovenox "bridging" around the time of cystoscopy. Who is your Urologist? Dr. Loletha Grayer

## 2020-03-20 ENCOUNTER — Ambulatory Visit: Payer: Medicare Other

## 2020-03-20 ENCOUNTER — Ambulatory Visit (INDEPENDENT_AMBULATORY_CARE_PROVIDER_SITE_OTHER): Payer: Medicare Other

## 2020-03-20 DIAGNOSIS — Z7901 Long term (current) use of anticoagulants: Secondary | ICD-10-CM | POA: Diagnosis not present

## 2020-03-20 LAB — POCT INR: INR: 2.2 (ref 2.0–3.0)

## 2020-03-20 NOTE — Patient Instructions (Addendum)
Pre visit review using our clinic review tool, if applicable. No additional management support is needed unless otherwise documented below in the visit note.  Continue 5mg  daily except 7.5mg  on Mondays. Recheck in 3 wks.

## 2020-03-23 ENCOUNTER — Other Ambulatory Visit: Payer: Self-pay

## 2020-03-23 ENCOUNTER — Ambulatory Visit (HOSPITAL_COMMUNITY): Payer: Medicare Other | Attending: Cardiovascular Disease

## 2020-03-23 DIAGNOSIS — I050 Rheumatic mitral stenosis: Secondary | ICD-10-CM | POA: Insufficient documentation

## 2020-03-24 ENCOUNTER — Telehealth (HOSPITAL_COMMUNITY): Payer: Self-pay | Admitting: Radiology

## 2020-03-24 NOTE — Telephone Encounter (Signed)
I called and spoke with Ms. Vanderpol, after receiving a telephone note from Dr. Sallyanne Kuster. The patient reiterated to me, her feeling of being violated and being made to feel uncomfortable during her echocardiogram. Ms. Inglis was so upset with her experience, she was unable to sleep last night and sent a [place] message at 2 am.  I expressed my deepest regret and apologized for the behavior of the sonographer. I informed her that [facility]'s ICARE values does not allow for this type of behavior. I informed her, her concerns would be addressed today with the sonographer. She was concerned that she was venting. I informed her, that it is only through voiced comments and concerns from patients, that we can make improvements in our care of our patients. Ms. Jobst was thankful that I called. She asked that the sonographer not perform anymore of her echocardiograms. I agreed. I instructed her to inform whomever is scheduling her appointment, not schedule with him.

## 2020-03-24 NOTE — Telephone Encounter (Signed)
error 

## 2020-03-25 ENCOUNTER — Other Ambulatory Visit: Payer: Self-pay | Admitting: *Deleted

## 2020-03-25 DIAGNOSIS — I050 Rheumatic mitral stenosis: Secondary | ICD-10-CM

## 2020-03-25 DIAGNOSIS — T82857D Stenosis of cardiac prosthetic devices, implants and grafts, subsequent encounter: Secondary | ICD-10-CM

## 2020-04-07 ENCOUNTER — Ambulatory Visit
Admission: RE | Admit: 2020-04-07 | Discharge: 2020-04-07 | Disposition: A | Discharge status: Home / Self Care | Payer: Medicare Other | Source: Ambulatory Visit | Attending: Family Medicine | Admitting: Family Medicine

## 2020-04-07 ENCOUNTER — Other Ambulatory Visit: Payer: Self-pay

## 2020-04-07 ENCOUNTER — Ambulatory Visit (INDEPENDENT_AMBULATORY_CARE_PROVIDER_SITE_OTHER): Payer: Medicare Other

## 2020-04-07 DIAGNOSIS — Z7901 Long term (current) use of anticoagulants: Secondary | ICD-10-CM | POA: Diagnosis not present

## 2020-04-07 DIAGNOSIS — Z1231 Encounter for screening mammogram for malignant neoplasm of breast: Secondary | ICD-10-CM

## 2020-04-07 LAB — POCT INR: INR: 3.4 — AB (ref 2.0–3.0)

## 2020-04-07 NOTE — Patient Instructions (Addendum)
Pre visit review using our clinic review tool, if applicable. No additional management support is needed unless otherwise documented below in the visit note.  Hold dose today then change weekly dose to take 5mg  daily.  Recheck in 3 wks.

## 2020-04-09 ENCOUNTER — Encounter: Payer: Self-pay | Admitting: Family Medicine

## 2020-04-09 LAB — HM MAMMOGRAPHY

## 2020-04-16 ENCOUNTER — Other Ambulatory Visit: Payer: Self-pay

## 2020-04-16 ENCOUNTER — Ambulatory Visit (INDEPENDENT_AMBULATORY_CARE_PROVIDER_SITE_OTHER): Payer: Medicare Other

## 2020-04-16 DIAGNOSIS — Z5181 Encounter for therapeutic drug level monitoring: Secondary | ICD-10-CM

## 2020-04-16 DIAGNOSIS — Z7901 Long term (current) use of anticoagulants: Secondary | ICD-10-CM | POA: Diagnosis not present

## 2020-04-16 LAB — POCT INR: INR: 2.2 (ref 2.0–3.0)

## 2020-04-16 NOTE — Patient Instructions (Addendum)
Pre visit review using our clinic review tool, if applicable. No additional management support is needed unless otherwise documented below in the visit note.  Continue 5mg  daily except 7.5mg  on Mondays.  Recheck in 4 wks.

## 2020-04-17 ENCOUNTER — Ambulatory Visit: Payer: Self-pay

## 2020-04-17 NOTE — Telephone Encounter (Signed)
Per Dr. Darnell Level, change pt INR range to 2.5-3, change weekly coumadin dose to 5mg  daily except take 7.5mg  on Mon and Fridays. Recheck on 7/20. Pt verbalized understanding.   Updated coumadin episode.

## 2020-04-17 NOTE — Progress Notes (Signed)
Cardiologist recommended changed INR range to 2.5-3.  Per Dr. Darnell Level, change pt INR range to 2.5-3, change weekly coumadin dose to 5mg  daily except take 7.5mg  on Mon and Fridays. Recheck on 7/20. Pt verbalized understanding.

## 2020-04-18 ENCOUNTER — Other Ambulatory Visit: Payer: Self-pay | Admitting: Internal Medicine

## 2020-04-28 ENCOUNTER — Other Ambulatory Visit: Payer: Self-pay

## 2020-04-28 ENCOUNTER — Ambulatory Visit (INDEPENDENT_AMBULATORY_CARE_PROVIDER_SITE_OTHER): Payer: Medicare Other

## 2020-04-28 ENCOUNTER — Ambulatory Visit: Payer: Medicare Other

## 2020-04-28 DIAGNOSIS — Z7901 Long term (current) use of anticoagulants: Secondary | ICD-10-CM | POA: Diagnosis not present

## 2020-04-28 LAB — POCT INR: INR: 4.2 — AB (ref 2.0–3.0)

## 2020-04-28 NOTE — Patient Instructions (Addendum)
Pre visit review using our clinic review tool, if applicable. No additional management support is needed unless otherwise documented below in the visit note.  Hold dose today then change weekly dose to take 5mg  daily except take 7.5mg  on Mondays. Recheck in 2wks.

## 2020-04-29 NOTE — Progress Notes (Signed)
Agree. Thanks

## 2020-05-01 ENCOUNTER — Ambulatory Visit (INDEPENDENT_AMBULATORY_CARE_PROVIDER_SITE_OTHER): Payer: Medicare Other | Admitting: *Deleted

## 2020-05-01 DIAGNOSIS — I441 Atrioventricular block, second degree: Secondary | ICD-10-CM | POA: Diagnosis not present

## 2020-05-01 LAB — CUP PACEART REMOTE DEVICE CHECK
Battery Remaining Longevity: 110 mo
Battery Remaining Percentage: 95.5 %
Battery Voltage: 2.98 V
Brady Statistic AP VP Percent: 78 %
Brady Statistic AP VS Percent: 1 %
Brady Statistic AS VP Percent: 22 %
Brady Statistic AS VS Percent: 1 %
Brady Statistic RA Percent Paced: 41 %
Brady Statistic RV Percent Paced: 99 %
Date Time Interrogation Session: 20210723063631
Implantable Lead Implant Date: 20170907
Implantable Lead Implant Date: 20170907
Implantable Lead Location: 753859
Implantable Lead Location: 753860
Implantable Pulse Generator Implant Date: 20170907
Lead Channel Impedance Value: 400 Ohm
Lead Channel Impedance Value: 540 Ohm
Lead Channel Pacing Threshold Amplitude: 0.5 V
Lead Channel Pacing Threshold Amplitude: 0.625 V
Lead Channel Pacing Threshold Pulse Width: 0.5 ms
Lead Channel Pacing Threshold Pulse Width: 0.5 ms
Lead Channel Sensing Intrinsic Amplitude: 12 mV
Lead Channel Sensing Intrinsic Amplitude: 3.7 mV
Lead Channel Setting Pacing Amplitude: 0.875
Lead Channel Setting Pacing Amplitude: 2 V
Lead Channel Setting Pacing Pulse Width: 0.5 ms
Lead Channel Setting Sensing Sensitivity: 2 mV
Pulse Gen Model: 2272
Pulse Gen Serial Number: 7945283

## 2020-05-04 NOTE — Progress Notes (Signed)
Remote pacemaker transmission.   

## 2020-05-12 ENCOUNTER — Other Ambulatory Visit: Payer: Self-pay

## 2020-05-12 ENCOUNTER — Ambulatory Visit (INDEPENDENT_AMBULATORY_CARE_PROVIDER_SITE_OTHER): Payer: Medicare Other

## 2020-05-12 DIAGNOSIS — Z7901 Long term (current) use of anticoagulants: Secondary | ICD-10-CM | POA: Diagnosis not present

## 2020-05-12 LAB — POCT INR: INR: 3.3 — AB (ref 2.0–3.0)

## 2020-05-12 NOTE — Patient Instructions (Addendum)
Pre visit review using our clinic review tool, if applicable. No additional management support is needed unless otherwise documented below in the visit note.  Take 2.5mg  tomorrow then change weekly dose to take 5mg  daily.  Recheck in 2 wks.

## 2020-05-14 ENCOUNTER — Ambulatory Visit: Payer: Medicare Other

## 2020-05-18 ENCOUNTER — Other Ambulatory Visit: Payer: Self-pay | Admitting: Cardiovascular Disease

## 2020-05-22 ENCOUNTER — Other Ambulatory Visit: Payer: Self-pay | Admitting: Family Medicine

## 2020-05-22 ENCOUNTER — Other Ambulatory Visit: Payer: Self-pay | Admitting: Internal Medicine

## 2020-05-26 ENCOUNTER — Ambulatory Visit (INDEPENDENT_AMBULATORY_CARE_PROVIDER_SITE_OTHER): Payer: Medicare Other

## 2020-05-26 ENCOUNTER — Other Ambulatory Visit: Payer: Self-pay

## 2020-05-26 DIAGNOSIS — Z7901 Long term (current) use of anticoagulants: Secondary | ICD-10-CM

## 2020-05-26 LAB — POCT INR: INR: 3.1 — AB (ref 2.0–3.0)

## 2020-05-26 NOTE — Patient Instructions (Addendum)
Pre visit review using our clinic review tool, if applicable. No additional management support is needed unless otherwise documented below in the visit note.  Take 2.5mg  tomorrow then change weekly dose to take 5mg  daily except take 2.5mg  on Mond.  Recheck in 3 wks.

## 2020-05-27 NOTE — Progress Notes (Signed)
Agree. Thanks

## 2020-06-04 ENCOUNTER — Other Ambulatory Visit: Payer: Self-pay | Admitting: Family Medicine

## 2020-06-04 DIAGNOSIS — E785 Hyperlipidemia, unspecified: Secondary | ICD-10-CM

## 2020-06-04 DIAGNOSIS — N182 Chronic kidney disease, stage 2 (mild): Secondary | ICD-10-CM

## 2020-06-04 DIAGNOSIS — E538 Deficiency of other specified B group vitamins: Secondary | ICD-10-CM

## 2020-06-04 DIAGNOSIS — E559 Vitamin D deficiency, unspecified: Secondary | ICD-10-CM

## 2020-06-04 DIAGNOSIS — I10 Essential (primary) hypertension: Secondary | ICD-10-CM

## 2020-06-05 ENCOUNTER — Other Ambulatory Visit (INDEPENDENT_AMBULATORY_CARE_PROVIDER_SITE_OTHER): Payer: Medicare Other

## 2020-06-05 ENCOUNTER — Other Ambulatory Visit: Payer: Self-pay

## 2020-06-05 DIAGNOSIS — E538 Deficiency of other specified B group vitamins: Secondary | ICD-10-CM | POA: Diagnosis not present

## 2020-06-05 DIAGNOSIS — E785 Hyperlipidemia, unspecified: Secondary | ICD-10-CM

## 2020-06-05 DIAGNOSIS — I10 Essential (primary) hypertension: Secondary | ICD-10-CM

## 2020-06-05 DIAGNOSIS — N182 Chronic kidney disease, stage 2 (mild): Secondary | ICD-10-CM

## 2020-06-05 DIAGNOSIS — E559 Vitamin D deficiency, unspecified: Secondary | ICD-10-CM | POA: Diagnosis not present

## 2020-06-05 LAB — COMPREHENSIVE METABOLIC PANEL
ALT: 20 U/L (ref 0–35)
AST: 25 U/L (ref 0–37)
Albumin: 3.5 g/dL (ref 3.5–5.2)
Alkaline Phosphatase: 74 U/L (ref 39–117)
BUN: 17 mg/dL (ref 6–23)
CO2: 32 mEq/L (ref 19–32)
Calcium: 9.1 mg/dL (ref 8.4–10.5)
Chloride: 101 mEq/L (ref 96–112)
Creatinine, Ser: 0.74 mg/dL (ref 0.40–1.20)
GFR: 76.52 mL/min (ref >60.00)
Glucose, Bld: 88 mg/dL (ref 70–99)
Potassium: 3.8 mEq/L (ref 3.5–5.1)
Sodium: 140 mEq/L (ref 135–145)
Total Bilirubin: 0.7 mg/dL (ref 0.2–1.2)
Total Protein: 6.3 g/dL (ref 6.0–8.3)

## 2020-06-05 LAB — VITAMIN B12: Vitamin B-12: 894 pg/mL (ref 211–911)

## 2020-06-05 LAB — CBC WITH DIFFERENTIAL/PLATELET
Basophils Absolute: 0.1 10*3/uL (ref 0.0–0.1)
Basophils Relative: 1 % (ref 0.0–3.0)
Eosinophils Absolute: 0.2 10*3/uL (ref 0.0–0.7)
Eosinophils Relative: 4.7 % (ref 0.0–5.0)
HCT: 42.5 % (ref 36.0–46.0)
Hemoglobin: 14.3 g/dL (ref 12.0–15.0)
Lymphocytes Relative: 14.3 % (ref 12.0–46.0)
Lymphs Abs: 0.7 10*3/uL (ref 0.7–4.0)
MCHC: 33.6 g/dL (ref 30.0–36.0)
MCV: 103.4 fl — ABNORMAL HIGH (ref 78.0–100.0)
Monocytes Absolute: 0.5 10*3/uL (ref 0.1–1.0)
Monocytes Relative: 10.5 % (ref 3.0–12.0)
Neutro Abs: 3.6 10*3/uL (ref 1.4–7.7)
Neutrophils Relative %: 69.5 % (ref 43.0–77.0)
Platelets: 128 10*3/uL — ABNORMAL LOW (ref 150.0–400.0)
RBC: 4.11 Mil/uL (ref 3.87–5.11)
RDW: 13.1 % (ref 11.5–15.5)
WBC: 5.2 10*3/uL (ref 4.0–10.5)

## 2020-06-05 LAB — LIPID PANEL
Cholesterol: 152 mg/dL (ref 0–200)
HDL: 61.7 mg/dL (ref >39.00)
LDL Cholesterol: 77 mg/dL (ref 0–99)
NonHDL: 90.25
Total CHOL/HDL Ratio: 2
Triglycerides: 65 mg/dL (ref 0.0–149.0)
VLDL: 13 mg/dL (ref 0.0–40.0)

## 2020-06-05 LAB — MICROALBUMIN / CREATININE URINE RATIO
Creatinine,U: 58.8 mg/dL
Microalb Creat Ratio: 7.4 mg/g (ref 0.0–30.0)
Microalb, Ur: 4.4 mg/dL — ABNORMAL HIGH (ref 0.0–1.9)

## 2020-06-05 LAB — VITAMIN D 25 HYDROXY (VIT D DEFICIENCY, FRACTURES): VITD: 32.26 ng/mL (ref 30.00–100.00)

## 2020-06-10 ENCOUNTER — Encounter: Payer: Self-pay | Admitting: Family Medicine

## 2020-06-10 ENCOUNTER — Other Ambulatory Visit: Payer: Self-pay

## 2020-06-10 ENCOUNTER — Ambulatory Visit (INDEPENDENT_AMBULATORY_CARE_PROVIDER_SITE_OTHER): Payer: Medicare Other | Admitting: Family Medicine

## 2020-06-10 VITALS — BP 118/72 | HR 77 | Temp 97.7°F | Ht 64.0 in | Wt 139.4 lb

## 2020-06-10 DIAGNOSIS — H53132 Sudden visual loss, left eye: Secondary | ICD-10-CM | POA: Diagnosis not present

## 2020-06-10 DIAGNOSIS — N3946 Mixed incontinence: Secondary | ICD-10-CM

## 2020-06-10 DIAGNOSIS — R413 Other amnesia: Secondary | ICD-10-CM

## 2020-06-10 DIAGNOSIS — E538 Deficiency of other specified B group vitamins: Secondary | ICD-10-CM

## 2020-06-10 DIAGNOSIS — J449 Chronic obstructive pulmonary disease, unspecified: Secondary | ICD-10-CM

## 2020-06-10 DIAGNOSIS — F5104 Psychophysiologic insomnia: Secondary | ICD-10-CM

## 2020-06-10 DIAGNOSIS — Z952 Presence of prosthetic heart valve: Secondary | ICD-10-CM

## 2020-06-10 DIAGNOSIS — E785 Hyperlipidemia, unspecified: Secondary | ICD-10-CM | POA: Diagnosis not present

## 2020-06-10 DIAGNOSIS — M7989 Other specified soft tissue disorders: Secondary | ICD-10-CM | POA: Diagnosis not present

## 2020-06-10 DIAGNOSIS — D696 Thrombocytopenia, unspecified: Secondary | ICD-10-CM

## 2020-06-10 DIAGNOSIS — Z5181 Encounter for therapeutic drug level monitoring: Secondary | ICD-10-CM | POA: Diagnosis not present

## 2020-06-10 DIAGNOSIS — Z8781 Personal history of (healed) traumatic fracture: Secondary | ICD-10-CM

## 2020-06-10 DIAGNOSIS — Z0000 Encounter for general adult medical examination without abnormal findings: Secondary | ICD-10-CM

## 2020-06-10 DIAGNOSIS — Z85048 Personal history of other malignant neoplasm of rectum, rectosigmoid junction, and anus: Secondary | ICD-10-CM

## 2020-06-10 DIAGNOSIS — Z923 Personal history of irradiation: Secondary | ICD-10-CM

## 2020-06-10 DIAGNOSIS — E559 Vitamin D deficiency, unspecified: Secondary | ICD-10-CM

## 2020-06-10 DIAGNOSIS — I5032 Chronic diastolic (congestive) heart failure: Secondary | ICD-10-CM

## 2020-06-10 DIAGNOSIS — Z7901 Long term (current) use of anticoagulants: Secondary | ICD-10-CM | POA: Diagnosis not present

## 2020-06-10 DIAGNOSIS — I10 Essential (primary) hypertension: Secondary | ICD-10-CM

## 2020-06-10 DIAGNOSIS — M4810 Ankylosing hyperostosis [Forestier], site unspecified: Secondary | ICD-10-CM

## 2020-06-10 DIAGNOSIS — K219 Gastro-esophageal reflux disease without esophagitis: Secondary | ICD-10-CM

## 2020-06-10 MED ORDER — TRAMADOL HCL 50 MG PO TABS: 50.0000 mg | ORAL_TABLET | Freq: Two times a day (BID) | ORAL | 0 refills | Status: DC | PRN

## 2020-06-10 MED ORDER — BREO ELLIPTA 200-25 MCG/INH IN AEPB
INHALATION_SPRAY | RESPIRATORY_TRACT | 11 refills | Status: DC
Start: 2020-06-10 — End: 2020-07-27

## 2020-06-10 MED ORDER — TEMAZEPAM 7.5 MG PO CAPS: 7.5000 mg | ORAL_CAPSULE | Freq: Every evening | ORAL | 0 refills | Status: DC | PRN

## 2020-06-10 NOTE — Progress Notes (Signed)
This visit was conducted in person.  BP 118/72 (BP Location: Left Arm, Patient Position: Sitting, Cuff Size: Normal)   Pulse 77   Temp 97.7 F (36.5 C) (Temporal)   Ht 5\' 4"  (1.626 m)   Wt 139 lb 7 oz (63.2 kg)   SpO2 96%   BMI 23.93 kg/m    CC: AMW Subjective:    Patient ID: [name], female    DOB: 1946/06/23, 74 y.o.   MRN: [id number]  HPI: [name] is a 74 y.o. female presenting on 06/10/2020 for Medicare Wellness   Did not see health advisor this year.    Hearing Screening   125Hz  250Hz  500Hz  1000Hz  2000Hz  3000Hz  4000Hz  6000Hz  8000Hz   Right ear:   20 20 20   0    Left ear:   25 0 40  0    Vision Screening Comments: Last eye exam, 02/2020.     Office Visit from 06/10/2020 in [place] at [facility] Total Score 0    Endorses difficulty hearing in noisy room or on phone. Declines audiology eval at this time.  Fall Risk  06/10/2020 05/02/2019 11/15/2017 11/14/2016 11/07/2016  Falls in the past year? 0 0 No No No  Number falls in past yr: - - - - -  Injury with Fall? - - - - -  Risk for fall due to : - - - - -  Risk for fall due to: Comment - - - - -    New INR range is 2.5-3. L vision loss when coumadin levels were too low.  Remains worried about memory. Notes periods of missed memory (ie doesn't remember going to friend's house for their birthday, or doesn't remember trip to ER).   Endorses occasional episodes of lightheadedness associated with chill lasting 10 seconds. Last episode 04/18/2020.   Preventative: COLONOSCOPY WITH PROPOFOL 06/09/2016 patent colo-colonic anastomosis, rpt 5 yrs Ardis Hughs) Well woman - none recently. Had normal paps in past and would like to age out of cervical cancer screening. Discussed other GYN cancersto watch for. Mammogram Apr 25, 2020 WNL.  DEXA - T -1.7 hip (06/2018). Doesn't drink milk. Eats yogurt every morning.  Flu shot yearly Pnuemovax 06/2013, prevnar 05/2014  Td 2006/01/23  [employer] 10/2019,  11/2019  zostavax - 09/2014  shingrix - completed 01-23-18  Advanced directives: scanned and in chart 10/2014. Has living will at home. HCPOA is husband(deceased)then son. Has updated at home, asked to bring [place] copy. Seat belt use discussed Sunscreen use discussed. No changing moles on skin. Non smoker Alcohol -1 glass wine a few nights a month Dentist - q6 mo Eye exam yearly monitoring cataracts Bowel - no constipation Bladder - ongoing incontinence followed by Dr Myna Hidalgo. Husband died April 26, 2015 MVA. 1 cat.  2 sons; 1 grandchild. One son died of MI 24 Married 01/23/1965  Retired-[employer], volunteers at [employer], to start at [facility] [employer] in [place] Activity: pulm rehab  Diet: some water, fruits/vegetables daily, yogurt daily     Relevant past medical, surgical, family and social history reviewed and updated as indicated. Interim medical history since our last visit reviewed. Allergies and medications reviewed and updated. Outpatient Medications Prior to Visit  Medication Sig Dispense Refill  . acetaminophen (TYLENOL) 500 MG tablet Take 2 tablets (1,000 mg total) by mouth daily.    Marland Kitchen albuterol (VENTOLIN HFA) 108 (90 Base) MCG/ACT inhaler Inhale 2 puffs into the lungs every 6 (six) hours as needed for wheezing. [place]  g 6  . amoxicillin (AMOXIL) 500 MG capsule TAKE 4 CAPSULES BY MOUTH 30-60 MINUTES PRIOR TO APPOINTMENT 4 capsule 12  . Biotin 1000 MCG tablet Take 1,000 mcg by mouth daily.    . Calcium Carb-Cholecalciferol (CALCIUM 600+D) 600-800 MG-UNIT TABS Take 1 tablet by mouth every evening.    . Cholecalciferol (VITAMIN D) 2000 UNITS CAPS Take 2,000 Units by mouth every evening.     . docusate sodium (COLACE) 100 MG capsule Take 200 mg by mouth every evening.    . furosemide (LASIX) 40 MG tablet TAKE 1 TABLET(40 MG) BY MOUTH DAILY 30 tablet 11  . GEMTESA 75 MG TABS Take 1 tablet by mouth daily.    Marland Kitchen ipratropium-albuterol (DUONEB) 0.5-2.5 (3) MG/3ML SOLN Take 3 mLs by nebulization every  6 (six) hours as needed. (Patient taking differently: Take 3 mLs by nebulization every 6 (six) hours as needed (asthma). ) 360 mL 0  . lovastatin (MEVACOR) 40 MG tablet TAKE 1 TABLET(40 MG) BY MOUTH AT BEDTIME 90 tablet 3  . montelukast (SINGULAIR) 10 MG tablet TAKE 1 TABLET(10 MG) BY MOUTH AT BEDTIME 30 tablet 3  . Multiple Vitamins-Minerals (PRESERVISION AREDS 2 PO) Take 2 capsules by mouth every evening.     . polycarbophil (FIBERCON) 625 MG tablet Take 625 mg by mouth every evening.     . polyethylene glycol (MIRALAX / GLYCOLAX) packet Take 17 g by mouth daily. 14 each 0  . potassium chloride (K-DUR) 10 MEQ tablet Take 2 tablets (20 mEq total) by mouth daily. 60 tablet 11  . SPIRIVA HANDIHALER 18 MCG inhalation capsule INHALE THE CONTENTS OF 1 CAPSULE VIA INHALATION DEVICE EVERY DAY 30 capsule 3  . [place] (ZEASORB EX) Apply 1 application topically daily as needed (yeast infection under stomach). Under skin folds (abdomen)    . vitamin B-12 (CYANOCOBALAMIN) 500 MCG tablet Take 500 mcg by mouth every other day.     . warfarin (COUMADIN) 5 MG tablet TAKE 1-2 TABLETS DAILY AS DIRECTED BY COUMADIN CLINIC 60 tablet 5  . Zinc 50 MG CAPS Take 1 capsule (50 mg total) by mouth daily.  0  . BREO ELLIPTA 200-25 MCG/INH AEPB INHALE 1 PUFF INTO THE LUNGS EVERY DAY 60 each 3  . traMADol (ULTRAM) 50 MG tablet Take 1 tablet (50 mg total) by mouth 2 (two) times daily as needed for moderate pain. 20 tablet 0  . levofloxacin (LEVAQUIN) 500 MG tablet Take 1 tablet (500 mg total) by mouth daily. 7 tablet 0  . nitrofurantoin, macrocrystal-monohydrate, (MACROBID) 100 MG capsule Take 100 mg by mouth every evening.     . TOVIAZ 8 MG TB24 tablet Take 8 mg by mouth every evening.   11   Facility-Administered Medications Prior to Visit  Medication Dose Route Frequency Provider Last Rate Last Admin  . ondansetron (ZOFRAN) 4 mg in sodium chloride 0.9 % 50 mL IVPB  4 mg Intravenous Q6H PRN Kristeen Miss, MD         Per HPI  unless specifically indicated in ROS section below Review of Systems  Constitutional: Negative for activity change, appetite change, chills, fatigue, fever and unexpected weight change.  HENT: Negative for hearing loss.   Eyes: Negative for visual disturbance.  Respiratory: Negative for cough, chest tightness, shortness of breath and wheezing.   Cardiovascular: Positive for palpitations (when laying down at night time). Negative for chest pain and leg swelling.  Gastrointestinal: Negative for abdominal distention, abdominal pain, blood in stool, constipation, diarrhea, nausea and  vomiting.  Genitourinary: Negative for difficulty urinating and hematuria.  Musculoskeletal: Negative for arthralgias, myalgias and neck pain.  Skin: Negative for rash.  Neurological: Negative for dizziness, seizures, syncope and headaches.  Hematological: Negative for adenopathy. Bruises/bleeds easily.  Psychiatric/Behavioral: Positive for dysphoric mood. The patient is not nervous/anxious.    Objective:  BP 118/72 (BP Location: Left Arm, Patient Position: Sitting, Cuff Size: Normal)   Pulse 77   Temp 97.7 F (36.5 C) (Temporal)   Ht 5\' 4"  (1.626 m)   Wt 139 lb 7 oz (63.2 kg)   SpO2 96%   BMI 23.93 kg/m   Wt Readings from Last 3 Encounters:  06/10/20 139 lb 7 oz (63.2 kg)  03/10/20 142 lb 7 oz (64.6 kg)  02/17/20 140 lb (63.5 kg)      Physical Exam Vitals and nursing note reviewed.  Constitutional:      General: She is not in acute distress.    Appearance: Normal appearance. She is well-developed. She is not ill-appearing.  HENT:     Head: Normocephalic and atraumatic.     Right Ear: Hearing normal.     Left Ear: Hearing normal.     Mouth/Throat:     Pharynx: Uvula midline.  Eyes:     General: No scleral icterus.    Extraocular Movements: Extraocular movements intact.     Conjunctiva/sclera: Conjunctivae normal.     Pupils: Pupils are equal, round, and reactive to light.  Neck:     Thyroid:  No thyroid mass, thyromegaly or thyroid tenderness.     Vascular: No carotid bruit.  Cardiovascular:     Rate and Rhythm: Normal rate and regular rhythm.     Pulses: Normal pulses.          Radial pulses are 2+ on the right side and 2+ on the left side.     Heart sounds: Murmur (3/6 systolic) heard.   Pulmonary:     Effort: Pulmonary effort is normal. No respiratory distress.     Breath sounds: No wheezing, rhonchi or rales.     Comments: Bibasilar crackles Abdominal:     General: Abdomen is flat. Bowel sounds are normal. There is no distension.     Palpations: Abdomen is soft. There is no mass.     Tenderness: There is no abdominal tenderness. There is no guarding or rebound.     Hernia: No hernia is present.  Musculoskeletal:        General: Normal range of motion.     Cervical back: Normal range of motion and neck supple.     Right lower leg: Edema (1+) present.     Left lower leg: No edema.     Comments: RLE edema present for 6-8 months  Lymphadenopathy:     Cervical: No cervical adenopathy.  Skin:    General: Skin is warm and dry.     Findings: No rash.  Neurological:     General: No focal deficit present.     Mental Status: She is alert and oriented to person, place, and time.     Comments:  CN grossly intact, station and gait intact Recall 3/3 Calculation 5/5 DLROW  Psychiatric:        Mood and Affect: Mood normal.        Behavior: Behavior normal.        Thought Content: Thought content normal.        Judgment: Judgment normal.       Results for orders placed or  performed in visit on 06/05/20  VITAMIN D 25 Hydroxy (Vit-D Deficiency, Fractures)  Result Value Ref Range   VITD 32.26 30.00 - 100.00 ng/mL  Lipid panel  Result Value Ref Range   Cholesterol 152 0 - 200 mg/dL   Triglycerides 65.0 0 - 149 mg/dL   HDL 61.70 >39.00 mg/dL   VLDL 13.0 0.0 - 40.0 mg/dL   LDL Cholesterol 77 0 - 99 mg/dL   Total CHOL/HDL Ratio 2    NonHDL 90.25   Comprehensive  metabolic panel  Result Value Ref Range   Sodium 140 135 - 145 mEq/L   Potassium 3.8 3.5 - 5.1 mEq/L   Chloride 101 96 - 112 mEq/L   CO2 32 19 - 32 mEq/L   Glucose, Bld 88 70 - 99 mg/dL   BUN 17 6 - 23 mg/dL   Creatinine, Ser 0.74 0.40 - 1.20 mg/dL   Total Bilirubin 0.7 0.2 - 1.2 mg/dL   Alkaline Phosphatase 74 39 - 117 U/L   AST 25 0 - 37 U/L   ALT 20 0 - 35 U/L   Total Protein 6.3 6.0 - 8.3 g/dL   Albumin 3.5 3.5 - 5.2 g/dL   GFR 76.52 >60.00 mL/min   Calcium 9.1 8.4 - 10.5 mg/dL  CBC with Differential/Platelet  Result Value Ref Range   WBC 5.2 4.0 - 10.5 K/uL   RBC 4.11 3.87 - 5.11 Mil/uL   Hemoglobin 14.3 12.0 - 15.0 g/dL   HCT 42.5 36 - 46 %   MCV 103.4 (H) 78.0 - 100.0 fl   MCHC 33.6 30.0 - 36.0 g/dL   RDW 13.1 11.5 - 15.5 %   Platelets 128.0 (L) 150 - 400 K/uL   Neutrophils Relative % 69.5 43 - 77 %   Lymphocytes Relative 14.3 12 - 46 %   Monocytes Relative 10.5 3 - 12 %   Eosinophils Relative 4.7 0 - 5 %   Basophils Relative 1.0 0 - 3 %   Neutro Abs 3.6 1.4 - 7.7 K/uL   Lymphs Abs 0.7 0.7 - 4.0 K/uL   Monocytes Absolute 0.5 0 - 1 K/uL   Eosinophils Absolute 0.2 0 - 0 K/uL   Basophils Absolute 0.1 0 - 0 K/uL  Vitamin B12  Result Value Ref Range   Vitamin B-12 894 211 - 911 pg/mL  Microalbumin / creatinine urine ratio  Result Value Ref Range   Microalb, Ur 4.4 (H) 0.0 - 1.9 mg/dL   Creatinine,U 58.8 mg/dL   Microalb Creat Ratio 7.4 0.0 - 30.0 mg/g   *Note: Due to a large number of results and/or encounters for the requested time period, some results have not been displayed. A complete set of results can be found in Results Review.   Depression screen [facility] 2/9 06/10/2020 05/02/2019 11/15/2017  Decreased Interest 0 0 1  Down, Depressed, Hopeless 0 0 1  PHQ - 2 Score 0 0 2  Altered sleeping - - 3  Tired, decreased energy - - 2  Change in appetite - - 3  Feeling bad or failure about yourself  - - 0  Trouble concentrating - - 0  Moving slowly or fidgety/restless -  - 0  Suicidal thoughts - - 0  PHQ-9 Score - - 10  Difficult doing work/chores - - Somewhat difficult  Some recent data might be hidden   No flowsheet data found.  Assessment & Plan:  This visit occurred during the SARS-CoV-2 public health emergency.  Safety protocols were in place,  including screening questions prior to the visit, additional usage of staff PPE, and extensive cleaning of exam room while observing appropriate contact time as indicated for disinfecting solutions.   Problem List Items Addressed This Visit    Vitamin D deficiency    Maintaining levels on 2000 IU daily.       Vitamin B12 deficiency    Continue oral replacement. Maintaining levels on 500mg  QOD.       Thrombocytopenia ([place])    With macrocytosis. Stable period. Update periph smear and folate next labwork.       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 Asymmetry compared to left. Check RLE venous US r/o DVT or other cause.       Relevant Orders   VAS [place] LOWER EXTREMITY VENOUS (DVT)   Personal history of rectal cancer    Will be due for rpt colonoscopy 2022      Mixed stress and urge urinary incontinence    Now on Gemtesa through urology.       Relevant Medications   GEMTESA 75 MG TABS   Memory loss    Describes periods of time she has no recollection of. ?global amnesia. rec monitor for now, if recurrent low threshold to refer to neurology.       Medicare annual wellness visit, subsequent - Primary    I have personally reviewed the Medicare Annual Wellness questionnaire and have noted 1. The patient's medical and social history 2. Their use of alcohol, tobacco or illicit drugs 3. Their current medications and supplements 4. The patient's functional ability including ADL's, fall risks, home safety risks and hearing or visual impairment. Cognitive function has been assessed and addressed as indicated.  5. Diet and physical  activity 6. Evidence for depression or mood disorders The patients weight, height, BMI have been recorded in the chart. I have made referrals, counseling and provided education to the patient based on review of the above and I have provided the pt with a written personalized care plan for preventive services. Provider list updated.. See scanned questionairre as needed for further documentation. Reviewed preventative protocols and updated unless pt declined.       HLD (hyperlipidemia)    Chronic, stable on lovastatin - continue. The 10-year ASCVD risk score Mikey Bussing DC Brooke Bonito., et al., 2013) is: 15.8%   Values used to calculate the score:     Age: 36 years     Sex: Female     Is Non-Hispanic African American: No     Diabetic: No     Tobacco smoker: No     Systolic Blood Pressure: 259 mmHg     Is BP treated: Yes     HDL Cholesterol: 61.7 mg/dL     Total Cholesterol: 152 mg/dL       History of vertebral fracture    Remote L1 vertebral fracture incidentally noted on imaging.  Would consider rpt DEXA.       History of radiation therapy   Health maintenance examination    Preventative protocols reviewed and updated unless pt declined. Discussed healthy diet and lifestyle.       GERD (gastroesophageal reflux disease)    Now off PPI       Essential hypertension, benign    Chronic, stable - improved with weight loss. Only on lasix 40mg  daily.       DISH (diffuse idiopathic skeletal hyperostosis)   COPD (  chronic obstructive pulmonary disease) (HCC)    Stable period on breo and spiriva with PRN albuterol inhaler.       Relevant Medications   fluticasone furoate-vilanterol (BREO ELLIPTA) 200-25 MCG/INH AEPB   Chronic insomnia    Chronic, tried and failed prior treatment. Desires to trial temazepam. Reviewed risks/benefits of medication, controlled substance qualification, habit forming nature and tolerance dependence potential - will send in 7.5mg  PRN nightly.       Chronic  diastolic heart failure (HCC)    Seems euvolemic today except for unilateral RLE edema - see below.       Anticoagulation management encounter    Concern for recent episodes of amaurosis fugax when coumadin levels were subtherapeutic - new INR rage is therefore 2.5-3.       Acute loss of vision, left    Transient episodes of vision loss concern for amaurosis fugax that can occur when coumadin levels are subtherapeutic - new INR rage is therefore 2.5-3.           Meds ordered this encounter  Medications  . fluticasone furoate-vilanterol (BREO ELLIPTA) 200-25 MCG/INH AEPB    Sig: Take one puff daily    Dispense:  60 each    Refill:  11    ZERO refills remain on this prescription. Your patient is requesting advance approval of refills for this medication to [facility]  . traMADol (ULTRAM) 50 MG tablet    Sig: Take 1 tablet (50 mg total) by mouth 2 (two) times daily as needed for moderate pain.    Dispense:  20 tablet    Refill:  0  . temazepam (RESTORIL) 7.5 MG capsule    Sig: Take 1 capsule (7.5 mg total) by mouth at bedtime as needed for sleep.    Dispense:  30 capsule    Refill:  0   No orders of the defined types were placed in this encounter.   Patient instructions: Ok to try temazepam 7.5mg  at night as needed for sleep.  Good to see you today.  We will refer you for ultrasound to evaluate right leg swelling Return in 6 months for follow up visit.   Follow up plan: Return in about 6 months (around 12/08/2020) for follow up visit.  Ria Bush, MD

## 2020-06-10 NOTE — Assessment & Plan Note (Signed)

## 2020-06-10 NOTE — Patient Instructions (Addendum)
Ok to try temazepam 7.5mg  at night as needed for sleep.  Good to see you today.  We will refer you for ultrasound to evaluate right leg swelling Return in 6 months for follow up visit.   Health Maintenance After Age 75 After age 2, you are at a higher risk for certain long-term diseases and infections as well as injuries from falls. Falls are a major cause of broken bones and head injuries in people who are older than age 63. Getting regular preventive care can help to keep you healthy and well. Preventive care includes getting regular testing and making lifestyle changes as recommended by your health care provider. Talk with your health care provider about:  Which screenings and tests you should have. A screening is a test that checks for a disease when you have no symptoms.  A diet and exercise plan that is right for you. What should I know about screenings and tests to prevent falls? Screening and testing are the best ways to find a health problem early. Early diagnosis and treatment give you the best chance of managing medical conditions that are common after age 53. Certain conditions and lifestyle choices may make you more likely to have a fall. Your health care provider may recommend:  Regular vision checks. Poor vision and conditions such as cataracts can make you more likely to have a fall. If you wear glasses, make sure to get your prescription updated if your vision changes.  Medicine review. Work with your health care provider to regularly review all of the medicines you are taking, including over-the-counter medicines. Ask your health care provider about any side effects that may make you more likely to have a fall. Tell your health care provider if any medicines that you take make you feel dizzy or sleepy.  Osteoporosis screening. Osteoporosis is a condition that causes the bones to get weaker. This can make the bones weak and cause them to break more easily.  Blood pressure  screening. Blood pressure changes and medicines to control blood pressure can make you feel dizzy.  Strength and balance checks. Your health care provider may recommend certain tests to check your strength and balance while standing, walking, or changing positions.  Foot health exam. Foot pain and numbness, as well as not wearing proper footwear, can make you more likely to have a fall.  Depression screening. You may be more likely to have a fall if you have a fear of falling, feel emotionally low, or feel unable to do activities that you used to do.  Alcohol use screening. Using too much alcohol can affect your balance and may make you more likely to have a fall. What actions can I take to lower my risk of falls? General instructions  Talk with your health care provider about your risks for falling. Tell your health care provider if: ? You fall. Be sure to tell your health care provider about all falls, even ones that seem minor. ? You feel dizzy, sleepy, or off-balance.  Take over-the-counter and prescription medicines only as told by your health care provider. These include any supplements.  Eat a healthy diet and maintain a healthy weight. A healthy diet includes low-fat dairy products, low-fat (lean) meats, and fiber from whole grains, beans, and lots of fruits and vegetables. Home safety  Remove any tripping hazards, such as rugs, cords, and clutter.  Install safety equipment such as grab bars in bathrooms and safety rails on stairs.  Keep rooms and walkways  well-lit. Activity   Follow a regular exercise program to stay fit. This will help you maintain your balance. Ask your health care provider what types of exercise are appropriate for you.  If you need a cane or walker, use it as recommended by your health care provider.  Wear supportive shoes that have nonskid soles. Lifestyle  Do not drink alcohol if your health care provider tells you not to drink.  If you drink  alcohol, limit how much you have: ? 0-1 drink a day for women. ? 0-2 drinks a day for men.  Be aware of how much alcohol is in your drink. In the [place] one drink equals one typical bottle of beer (12 oz), one-half glass of wine (5 oz), or one shot of hard liquor (1 oz).  Do not use any products that contain nicotine or tobacco, such as cigarettes and e-cigarettes. If you need help quitting, ask your health care provider. Summary  Having a healthy lifestyle and getting preventive care can help to protect your health and wellness after age 56.  Screening and testing are the best way to find a health problem early and help you avoid having a fall. Early diagnosis and treatment give you the best chance for managing medical conditions that are more common for people who are older than age 51.  Falls are a major cause of broken bones and head injuries in people who are older than age 7. Take precautions to prevent a fall at home.  Work with your health care provider to learn what changes you can make to improve your health and wellness and to prevent falls. This information is not intended to replace advice given to you by your health care provider. Make sure you discuss any questions you have with your health care provider. Document Revised: 01/17/2019 Document Reviewed: 08/09/2017 Elsevier Patient Education  2020 [employer].

## 2020-06-10 NOTE — Assessment & Plan Note (Signed)
Preventative protocols reviewed and updated unless pt declined. Discussed healthy diet and lifestyle.  

## 2020-06-11 ENCOUNTER — Encounter: Payer: Self-pay | Admitting: Family Medicine

## 2020-06-11 DIAGNOSIS — M7989 Other specified soft tissue disorders: Secondary | ICD-10-CM

## 2020-06-11 DIAGNOSIS — H53132 Sudden visual loss, left eye: Secondary | ICD-10-CM | POA: Insufficient documentation

## 2020-06-11 HISTORY — DX: Other specified soft tissue disorders: M79.89

## 2020-06-11 NOTE — Assessment & Plan Note (Addendum)
Continue oral replacement. Maintaining levels on 500mg  QOD.

## 2020-06-11 NOTE — Assessment & Plan Note (Signed)
Remote L1 vertebral fracture incidentally noted on imaging.  Would consider rpt DEXA.

## 2020-06-11 NOTE — Assessment & Plan Note (Signed)
Describes periods of time she has no recollection of. ?global amnesia. rec monitor for now, if recurrent low threshold to refer to neurology.

## 2020-06-11 NOTE — Assessment & Plan Note (Signed)
Will be due for rpt colonoscopy 2022

## 2020-06-11 NOTE — Assessment & Plan Note (Signed)
Now on Gemtesa through urology.

## 2020-06-11 NOTE — Assessment & Plan Note (Signed)
Maintaining levels on 2000 IU daily.

## 2020-06-11 NOTE — Assessment & Plan Note (Signed)
Chronic, stable - improved with weight loss. Only on lasix 40mg  daily.

## 2020-06-11 NOTE — Assessment & Plan Note (Signed)
Chronic, stable on lovastatin - continue. The 10-year ASCVD risk score Mikey Bussing DC Brooke Bonito., et al., 2013) is: 15.8%   Values used to calculate the score:     Age: 74 years     Sex: Female     Is Non-Hispanic African American: No     Diabetic: No     Tobacco smoker: No     Systolic Blood Pressure: 898 mmHg     Is BP treated: Yes     HDL Cholesterol: 61.7 mg/dL     Total Cholesterol: 152 mg/dL

## 2020-06-11 NOTE — Assessment & Plan Note (Signed)
With macrocytosis. Stable period. Update periph smear and folate next labwork.

## 2020-06-11 NOTE — Assessment & Plan Note (Signed)
Newly noted over the past 6 months. Asymmetry compared to left. Check RLE venous US r/o DVT or other cause.

## 2020-06-11 NOTE — Assessment & Plan Note (Signed)
Now off PPI

## 2020-06-11 NOTE — Assessment & Plan Note (Signed)
Seems euvolemic today except for unilateral RLE edema - see below.

## 2020-06-11 NOTE — Assessment & Plan Note (Signed)
Transient episodes of vision loss concern for amaurosis fugax that can occur when coumadin levels are subtherapeutic - new INR rage is therefore 2.5-3.

## 2020-06-11 NOTE — Assessment & Plan Note (Signed)
Concern for recent episodes of amaurosis fugax when coumadin levels were subtherapeutic - new INR rage is therefore 2.5-3.

## 2020-06-11 NOTE — Assessment & Plan Note (Signed)
Chronic, tried and failed prior treatment. Desires to trial temazepam. Reviewed risks/benefits of medication, controlled substance qualification, habit forming nature and tolerance dependence potential - will send in 7.5mg  PRN nightly.

## 2020-06-11 NOTE — Assessment & Plan Note (Signed)
Stable period on breo and spiriva with PRN albuterol inhaler.

## 2020-06-13 ENCOUNTER — Other Ambulatory Visit: Payer: Self-pay | Admitting: Family Medicine

## 2020-06-16 ENCOUNTER — Other Ambulatory Visit: Payer: Self-pay

## 2020-06-16 ENCOUNTER — Ambulatory Visit (INDEPENDENT_AMBULATORY_CARE_PROVIDER_SITE_OTHER): Payer: Medicare Other

## 2020-06-16 DIAGNOSIS — Z7901 Long term (current) use of anticoagulants: Secondary | ICD-10-CM

## 2020-06-16 LAB — POCT INR: INR: 2.7 (ref 2.0–3.0)

## 2020-06-16 MED ORDER — POTASSIUM CHLORIDE ER 10 MEQ PO TBCR: 20.0000 meq | EXTENDED_RELEASE_TABLET | Freq: Every day | ORAL | 9 refills | Status: DC

## 2020-06-16 NOTE — Patient Instructions (Addendum)
Pre visit review using our clinic review tool, if applicable. No additional management support is needed unless otherwise documented below in the visit note.  Continue 5 mg daily except take 2.5 mg on Mondays.  Recheck in 4 wks.

## 2020-06-17 ENCOUNTER — Ambulatory Visit (HOSPITAL_COMMUNITY)
Admission: RE | Admit: 2020-06-17 | Discharge: 2020-06-17 | Disposition: A | Discharge status: Home / Self Care | Payer: Medicare Other | Source: Ambulatory Visit | Attending: Cardiovascular Disease | Admitting: Cardiovascular Disease

## 2020-06-17 DIAGNOSIS — M7989 Other specified soft tissue disorders: Secondary | ICD-10-CM

## 2020-06-29 ENCOUNTER — Encounter: Payer: Self-pay | Admitting: Family Medicine

## 2020-06-29 DIAGNOSIS — H9190 Unspecified hearing loss, unspecified ear: Secondary | ICD-10-CM

## 2020-07-13 ENCOUNTER — Ambulatory Visit: Payer: Medicare Other | Admitting: Pulmonary Disease

## 2020-07-14 ENCOUNTER — Encounter: Payer: Self-pay | Admitting: Internal Medicine

## 2020-07-14 ENCOUNTER — Ambulatory Visit (INDEPENDENT_AMBULATORY_CARE_PROVIDER_SITE_OTHER): Payer: Medicare Other

## 2020-07-14 ENCOUNTER — Ambulatory Visit (INDEPENDENT_AMBULATORY_CARE_PROVIDER_SITE_OTHER): Payer: Medicare Other | Admitting: Internal Medicine

## 2020-07-14 ENCOUNTER — Other Ambulatory Visit: Payer: Self-pay

## 2020-07-14 VITALS — BP 130/70 | HR 70 | Temp 97.2°F | Ht 64.0 in | Wt 141.2 lb

## 2020-07-14 DIAGNOSIS — Z7901 Long term (current) use of anticoagulants: Secondary | ICD-10-CM | POA: Diagnosis not present

## 2020-07-14 DIAGNOSIS — R918 Other nonspecific abnormal finding of lung field: Secondary | ICD-10-CM

## 2020-07-14 DIAGNOSIS — Z23 Encounter for immunization: Secondary | ICD-10-CM

## 2020-07-14 DIAGNOSIS — Z7185 Encounter for immunization safety counseling: Secondary | ICD-10-CM | POA: Diagnosis not present

## 2020-07-14 DIAGNOSIS — J449 Chronic obstructive pulmonary disease, unspecified: Secondary | ICD-10-CM | POA: Diagnosis not present

## 2020-07-14 LAB — POCT INR: INR: 2.3 (ref 2.0–3.0)

## 2020-07-14 MED ORDER — TRELEGY ELLIPTA 200-62.5-25 MCG/INH IN AEPB: 1.0000 | INHALATION_SPRAY | Freq: Every day | RESPIRATORY_TRACT | 0 refills | Status: DC

## 2020-07-14 NOTE — Progress Notes (Signed)
PCP Ria Bush, MD  HPI    OV 08/02/2017  Chief Complaint  Patient presents with  . Follow-up    Pt states that she got bronchitis x1 month ago. Has been on two rounds of abx and two rounds of steroids. States that it is not in her chest anymore but is still having drainage, sore throat, dry cough, occ. SOB. Denies any CP.    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 She comes for routine follow-up. Recently she had COPD flareup was treated by primary care. Then I called in antibiotic and repeat prednisone and this again helped her. Nevertheless she is frustrated by postnasal drainage and the resultant cough. She says normally for COPD exacerbations are associated with initial onset of sinus congestion. Then she's always left with a residual postnasal drip but this time it has lingered longer. She says Sudafed helps her but is no longer available over-the-counter. She really wants relief about this critically. COPD cat score is 14. Is also asking me to sign handicap parking sticker   OV 10/31/2017  Chief Complaint  Patient presents with  . Follow-up    In hosp. 11/13-11/15 due to sepsis from UTI. Pt states she is doing good but can't seem to get her energy back. Pt currently has laryngitis and SOB with exertion.    Follow-up Gold stage II/III COPD not on home oxygen but on triple inhaler therapy.  Is a routine follow-up.  Since I last saw her in the middle of November 2018 she got diagnosed with E. coli sepsis and admitted to the hospital.  Since then she has significant amount of fatigue.  The fatigue is not improved much.  She has suspended her maintenance pulmonary rehabilitation but plans to restart it in March 2019.  She has not had any COPD exacerbations.  She is up-to-date with the flu shot.  Overall COPD CAT score is stable but there is an increase in the level of fatigue.  The only new issue that she complains about is some mild  hoarseness in voice going on for several months.  This is present early in the morning when she wakes up and then as the day goes on it is better.  She notices it when she talks a lot of time in the phone after a long period of rest.  It is insidious onset.  It is not progressive.  There are no other associated symptoms.   OV 02/05/2018  Chief Complaint  Patient presents with  . Follow-up    Pt states she saw PCP two weeks ago and had pna. Pt states she is still not over the pna and has no energy, has hoarseness in voice, cough, and has chest tightness.     Follow-up gold stage II/III COPD not on home oxygen but on triple inhaler therapy with Singulair  Routine follow-up.  She tells me t of labs from January 22, 2018 hat January 22, 2018 she started feeling chest tightness wheezing and also increase head cold.  She saw her primary care physician Dr. Danise Mina.  Apparently all the blood work was normal except for mild increase in liver enzymes which she says she fluctuates with.  He was initially thinking a sinus congestion but when he did an x-ray of the report came back as a right lower lobe pneumonia [I personally visualized this x-ray and I agree she might not have right lower lobe pneumonia because in 2015 CT chest  she had some chronic scarring at that area].  She was treated with 5-day Levaquin and a Medrol Dosepak but despite this other than subjective fevers are resolving she still has significant cough congestion and chest tightness without any orthopnea proximal nocturnal dyspnea.  COPD CAT score shows significant deterioration.  She is not feeling well.  Of note, she is intentionally losing weight because of weight watchers but she has not increased her fluid intake adequately.  She has quit all diet soda  Show slight increase in liver enzymes and also slight increase in creatinine compared to 6 months ago.   OV 03/13/2018  Chief Complaint  Patient presents with  . Follow-up    Breathing  is improved since last OV. Still reports DOE and occasional cough. Denies chest tightness or wheezing.    Olesya J  presents for follow-up of her gold stage II /3 of COPD.  On triple inhaler therapy with Spiriva and Brio and also on Singulair   Overall in terms of her COPD: She is stable.  She is compliant with her inhalers.  She is not on oxygen therapy.  Her symptoms actually improved.  In the interim she was diagnosed with atrial fibrillation and she is placed on Xarelto.  She is frustrated because she has to be on 1 more extra medication.  She did have pulmonary function test that is documented below because it has been a while since we did one.  It actually shows improvement in the lung function.  She also had a CT scan of the chest because the last one was in 2015.  This shows scattered lung nodules with the largest one 8 mm in the right lower lobe.  The radiologist has described this is nonspecific in appearance.  She heard the result and she has been extremely anxious and upset.  She is worried that her colon cancer has metastasized or she has lung cancer.  Of note, I personally visualized the CT chest and agree with the findings   OV 06/27/2018  Subjective:  Patient ID: [name], female , DOB: [birth year] , age 74 y.o. , MRN: [id number] , ADDRESS: [address] [place] [address]   06/27/2018 -   Chief Complaint  Patient presents with  . Follow-up    Pt states her breathing has been doing good since last visit. States she has been having some BP issues to the point she fainted and cracked some ribs.  Denies any problems with cough or CP.     HPI [name] 74 y.o. - follow-up for  - COPD: Overall this is stable with inhaler therapy. She has not had her flu shot. She wants to wait 2 weeks because she just had first shot of her shingles vaccine. She is up-to-date with her pneumonia vaccine. In the interim she has lost weight 60 pounds intentionally and this is  improved on Score  Multiple  Lung nodules: She had follow-up CT chest in September 2019 and compared to May 2019: One of the nodules is resolved to 16 mm and stable. She does have some rib fractures subacute and this is because she fell down. She's been having balance issues with the weight loss and orthostatic hypotension. Both of this is being managed by neurosurgery and primary care  Travel advice: She plans to have back surgery for her foot drop after this she plans to recover at her son's home near [place]. Therefore she wants a antibiotic and prednisone taper to be  handy with her in case of a future COPD exacerbation.    Results for SANJUANITA, CONDREY (MRN [id number]) as of 03/13/2018 11:58  Ref. Range 11/29/2013 16:04 04/01/2016 10:10 03/13/2018 10:36  FEV1-Pre Latest Units: L 0.93 1.35 1.42  FEV1-%Pred-Pre Latest Units: % 34 55 59   Results for LAVINE, HARGROVE (MRN [id number]) as of 03/13/2018 11:58  Ref. Range 03/13/2018 10:36  DLCO cor Latest Units: ml/min/mmHg 22.70  DLCO cor % pred Latest Units: % 84     CT chest Sept 4, 2019 Lungs/Pleura: Pleural thickening posteriorly in the right hemithorax is stable. There is no significant pleural effusion. There is stable subpleural scarring posteriorly in the right lower lobe. There is central airway thickening with scattered tree-in-bud nodularity in both lungs, similar to the most recent prior study. Nodular scarring in both upper lobes is stable. The right lower lobe perifissural nodule present on the most recent study has resolved. There is a stable 6 x 5 mm right lower lobe nodule on image 119/3. No new or enlarging pulmonary nodules.  IMPRESSION: 1. Generally stable chronic lung disease with pleuroparenchymal scarring and scattered nodularity. Most of these nodules have a tree-in-bud appearance, likely post infectious or inflammatory. The dominant right lower lobe nodule seen on the most recent study has resolved. No new or  enlarging nodules. 2. No acute chest findings. Healing subacute right-sided rib fractures. 3. Extensive Aortic Atherosclerosis (ICD10-I70.0).   Electronically Signed   By: Richardean Sale [name]   On: 06/13/2018 14   ROS - per HPI  OV 01/22/2020  Subjective:  Patient ID: Nariya Alfonse Flavors, female , DOB: [birth year] , age 33 y.o. , MRN: [id number] , ADDRESS: [address] [place] [address]   01/22/2020 -   Chief Complaint  Patient presents with  . Follow-up    Pt states she has been doing good since last visit and denies any complaints.     HPI Tenley Jamille Fisher 74 y.o. -presents for follow-up.  Last seen in 2019.  After that because of the pandemic have not seen her.  She saw the nurse practitioner once.  The big interval changes that she has lost a lot of weight.  It is intentional because she is try to keep her self it.  From a functional standpoint COPD symptoms are much improved after weight loss.  She takes Spiriva Singulair and Breo.  However she is having significant back pain.  Other than that she feels fine.  She not able to do pulmonary rehab because of the back pain.  She has had a Covid vaccine  Other issues lung nodules.  Her last CT scan of the chest was in July 2020.  She has waxing and waning.  She is willing to have another CT scan of the chest at the appropriate time interval.      CAT COPD Symptom & Quality of Life Score (Elias-Fela Solis) 0 is no burden. 5 is highest burden 08/02/2017  10/31/2017  02/05/2018  06/27/2018 60+ # intentioal weight loss  Never Cough -> Cough all the time 1 2 4 1   No phlegm in chest -> Chest is full of phlegm 0 0 3 0  No chest tightness -> Chest feels very tight 1 0 3 0  No dyspnea for 1 flight stairs/hill -> Very dyspneic for 1 flight of stairs 5 4 5 4   No limitations for ADL at home -> Very limited with ADL at home 1 0 5 3  Confident leaving  home -> Not at all confident leaving home 0 0 3 1  Sleep soundly -> Do  not sleep soundly because of lung condition 4 4 3 3   Lots of Energy -> No energy at all 2 3 5 4   TOTAL Score (max 40)  14 13 31 16    CAT Score 01/22/2020  Total CAT Score 9      ROS - per HPI    OV 07/14/2020  Subjective:  Patient ID: Mykenna Alfonse Flavors, female , DOB: March 10, 1946 , age 11 y.o. , MRN: [id number] , ADDRESS: [address] [place] [address]  PCP Ria Bush, MD    07/14/2020 -   Chief Complaint  Patient presents with  . Follow-up    no complaints    Advanced COPD on Spiriva and Breo and Singulair Multiple lung nodules with waxing and waning quality -last CT scan July 2020  HPI Anne Voula Waln 74 y.o. -presents for follow-up of her COPD.  Last seen in early part of this year.  She is supposed to have a follow-up CT scan of the chest but this has not happened.  Patient tells me that she wants to hold off on getting the CT scan of the chest right now but will do it at the time of follow-up.  Last CT scan of the chest was in July 2020.  At this point in time she continues on Spiriva, Breo and Singulair.  She feels well.  She has been social distancing.  She has had her Covid vaccine but will have a booster next week.  She will have a flu shot today but at the Coumadin clinic.  There are no active complaints.  She is interested in consolidating her Spiriva and Breo and to Trelegy.  She denies any recent exacerbations.   CAT Score 07/14/2020 01/22/2020  Total CAT Score 6 9     No flowsheet data found.   No flowsheet data found.    PFT Results Latest Ref Rng & Units 03/13/2018 04/01/2016 11/29/2013  FVC-Pre L 2.21 2.22 1.49  FVC-Predicted Pre % 70 68 42  FVC-Post L - 2.33 1.60  FVC-Predicted Post % - 72 45  Pre FEV1/FVC % % 64 61 63  Post FEV1/FCV % % - 63 65  FEV1-Pre L 1.42 1.35 0.93  FEV1-Predicted Pre % 59 55 34  FEV1-Post L - 1.47 1.05  DLCO uncorrected ml/min/mmHg 24.55 19.21 -  DLCO UNC% % 91 71 -  DLCO corrected ml/min/mmHg 22.70 -  -  DLCO COR %Predicted % 84 - -  DLVA Predicted % 102 95 -  TLC L - 5.00 -  TLC % Predicted % - 93 -  RV % Predicted % - 121 -      ROS - per HPI     has a past medical history of Acute cystitis without hematuria (12/22/2015), Anemia, Arthritis, CAP (community acquired pneumonia) (02/08/2016), CAP (community acquired pneumonia) (02/08/2016), Cataracts, bilateral, Chronic cholecystitis with calculus (01/21/2015), Chronic diastolic heart failure ([place]) (09/19/2014), Chronic insomnia, Constipation, COPD (chronic obstructive pulmonary disease) ([place]), DDD (degenerative disc disease), Dyspnea, Essential hypertension, GERD (gastroesophageal reflux disease), History of pneumonia (02/16/2016), History of radiation therapy (05/30/11 to 07/07/11), History of shingles, Hyperlipemia, Legally blind in left eye, as defined in [place], Lung nodule, Obesity, Osteopenia (12/2014), Pancreatitis (11/2014), PONV (postoperative nausea and vomiting), Presence of permanent cardiac pacemaker, Rectal cancer ([place]) (04/2011), Research study patient (08/26/2016), Rheumatic heart disease mitral stenosis, S/P AVR (aortic valve replacement) (2015), S/P MVR (mitral  valve replacement) (2015), Seizures ([place]), Sepsis secondary to UTI ([place]) (08/22/2017), Severe aortic valve stenosis  AVR 3/15 (10/01/2013), Sleep apnea, Small bowel obstruction, partial ([place]) (08/2013), and Vitamin B12 deficiency (02/28/2015).   reports that she quit smoking about 45 years ago. Her smoking use included cigarettes. She has a 15.00 pack-year smoking history. She has never used smokeless tobacco.  Past Surgical History:  Procedure Laterality Date  . ANTERIOR LAT LUMBAR FUSION Left 07/17/2018   Procedure: Lumbar Two-Three Lumbar Three-Four Anterolateral decompression/interbody fusion with lateral plate fixation/Infuse;  Surgeon: Kristeen Miss, MD;  Location: [facility];  Service: Neurosurgery;  Laterality: Left;  Lumbar Two-Three Lumbar Three-Four Anterolateral  decompression/interbody fusion with lateral plate fixation/Infuse  . AORTIC VALVE REPLACEMENT N/A 12/12/2013   Procedure: AORTIC VALVE REPLACEMENT (AVR);  Surgeon: Gaye Pollack, MD; Service: Open Heart Surgery  . BACK SURGERY  2006, 2007   2006 SPACER, 2007 decompression and fusion L4/5  . BOWEL RESECTION  04/02/2012   Procedure: SMALL BOWEL RESECTION;  Surgeon: Stark Klein, MD;  Location: [facility];  Service: General;  Laterality: N/A;  . CARPAL TUNNEL RELEASE Bilateral   . CATARACT EXTRACTION W/ INTRAOCULAR LENS IMPLANT Left 10/18/2016  . CATARACT EXTRACTION W/ INTRAOCULAR LENS IMPLANT Right 12/13/2016   Dr. Kathrin Penner  . CHOLECYSTECTOMY N/A 01/21/2015   chronic cholecystitis, Stark Klein, MD  . COLON RESECTION  08/25/2011   Procedure: COLON RESECTION LAPAROSCOPIC;  Surgeon: Stark Klein, MD;  Location: [facility];  Service: General;  Laterality: N/A;  Laparoscopic Assisted Low Anterior Resection Diverting Ostomy and onQ pain pump  . COLONOSCOPY  03/2013   1 polyp, rpt 3 yrs Ardis Hughs)  . COLONOSCOPY WITH PROPOFOL N/A 06/09/2016   patent colo-colonic anastomosis, rpt 5 yrs Ardis Hughs)  . EP IMPLANTABLE DEVICE N/A 06/16/2016   Procedure: Pacemaker Implant;  Surgeon: Will Meredith Leeds, MD;  Location: [facility];  Service: Cardiovascular;  Laterality: N/A;  . FOOT SURGERY  left foot   hammer toe  . HAND SURGERY  Bil   carpal tunnel  . ILEOSTOMY  08/25/2011  . ILEOSTOMY CLOSURE  04/02/2012   Procedure: ILEOSTOMY TAKEDOWN;  Surgeon: Stark Klein, MD;  Location: [facility];  Service: General;  Laterality: N/A;  . INTRAOPERATIVE TRANSESOPHAGEAL ECHOCARDIOGRAM N/A 12/12/2013   Procedure: INTRAOPERATIVE TRANSESOPHAGEAL ECHOCARDIOGRAM;  Surgeon: Gaye Pollack, MD;  Location: [facility];  Service: Open Heart Surgery;  Laterality: N/A;  . LEFT AND RIGHT HEART CATHETERIZATION WITH CORONARY ANGIOGRAM N/A 11/27/2013   Procedure: LEFT AND RIGHT HEART CATHETERIZATION WITH CORONARY ANGIOGRAM;  Surgeon: Sanda Klein,  MD;  Location: [facility];  Service: Cardiovascular;  Laterality: N/A;  . MITRAL VALVE REPLACEMENT N/A 12/12/2013   Procedure: MITRAL VALVE (MV) REPLACEMENT OR REPAIR;  Surgeon: Gaye Pollack, MD; Service: Open Heart Surgery  . TEE WITHOUT CARDIOVERSION N/A 11/29/2013   Procedure: TRANSESOPHAGEAL ECHOCARDIOGRAM (TEE);  Surgeon: Sueanne Margarita, MD;  Location: [facility];  Service: Cardiovascular;  Laterality: N/A;  . TEE WITHOUT CARDIOVERSION N/A 06/04/2019   Procedure: TRANSESOPHAGEAL ECHOCARDIOGRAM (TEE);  Surgeon: Sanda Klein, MD;  Location: [facility]OS[facility];  Service: Cardiovascular;  Laterality: N/A;  . TONSILLECTOMY      Allergies  Allergen Reactions  . Codeine Nausea Only  . Sulfa Antibiotics Nausea Only  . Sulfonamide Derivatives Nausea Only    Immunization History  Administered Date(s) Administered  . Fluad Quad(high Dose 65+) 07/11/2019  . H1N1 09/17/2008  . Influenza Split 07/18/2012  . Influenza Whole 08/10/2005, 06/24/2009, 07/11/2011  . Influenza, High Dose Seasonal  PF 07/04/2016, 07/04/2017  . Influenza,inj,Quad PF,6+ Mos 07/09/2018  . Influenza-Unspecified 07/24/2013, 06/30/2014, 06/30/2015  . PFIZER SARS-COV-2 Vaccination 10/30/2019, 11/20/2019  . Pneumococcal Conjugate-13 06/02/2014  . Pneumococcal Polysaccharide-23 08/10/2005, 04/21/2008, 06/25/2013  . Td 10/11/2005  . Zoster 09/16/2014  . Zoster Recombinat (Shingrix) 06/25/2018, 08/28/2018    Family History  Problem Relation Age of Onset  . Lung cancer Father   . Heart disease Mother   . Diabetes Mother   . Hypertension Mother   . Stroke Mother   . CAD Son 57  . Heart attack Son 55  . Heart attack Maternal Grandfather   . Breast cancer Maternal Aunt 83  . Colon cancer Neg Hx   . Esophageal cancer Neg Hx   . Stomach cancer Neg Hx   . Rectal cancer Neg Hx      Current Outpatient Medications:  .  acetaminophen (TYLENOL) 500 MG tablet, Take 2 tablets (1,000 mg total) by mouth daily., Disp: , Rfl:   .  albuterol (VENTOLIN HFA) 108 (90 Base) MCG/ACT inhaler, Inhale 2 puffs into the lungs every 6 (six) hours as needed for wheezing., Disp: 18 g, Rfl: 6 .  amoxicillin (AMOXIL) 500 MG capsule, TAKE 4 CAPSULES BY MOUTH 30-60 MINUTES PRIOR TO APPOINTMENT, Disp: 4 capsule, Rfl: 12 .  Biotin 1000 MCG tablet, Take 1,000 mcg by mouth daily., Disp: , Rfl:  .  Calcium Carb-Cholecalciferol (CALCIUM 600+D) 600-800 MG-UNIT TABS, Take 1 tablet by mouth every evening., Disp: , Rfl:  .  Cholecalciferol (VITAMIN D) 2000 UNITS CAPS, Take 2,000 Units by mouth every evening. , Disp: , Rfl:  .  docusate sodium (COLACE) 100 MG capsule, Take 200 mg by mouth every evening., Disp: , Rfl:  .  fluticasone furoate-vilanterol (BREO ELLIPTA) 200-25 MCG/INH AEPB, Take one puff daily, Disp: 60 each, Rfl: 11 .  furosemide (LASIX) 40 MG tablet, TAKE 1 TABLET(40 MG) BY MOUTH DAILY, Disp: 30 tablet, Rfl: 11 .  GEMTESA 75 MG TABS, Take 1 tablet by mouth daily., Disp: , Rfl:  .  ipratropium-albuterol (DUONEB) 0.5-2.5 (3) MG/3ML SOLN, Take 3 mLs by nebulization every 6 (six) hours as needed. (Patient taking differently: Take 3 mLs by nebulization every 6 (six) hours as needed (asthma). ), Disp: 360 mL, Rfl: 0 .  lovastatin (MEVACOR) 40 MG tablet, TAKE 1 TABLET(40 MG) BY MOUTH AT BEDTIME, Disp: 90 tablet, Rfl: 3 .  montelukast (SINGULAIR) 10 MG tablet, TAKE 1 TABLET(10 MG) BY MOUTH AT BEDTIME, Disp: 30 tablet, Rfl: 3 .  Multiple Vitamins-Minerals (PRESERVISION AREDS 2 PO), Take 2 capsules by mouth every evening. , Disp: , Rfl:  .  polycarbophil (FIBERCON) 625 MG tablet, Take 625 mg by mouth every evening. , Disp: , Rfl:  .  polyethylene glycol (MIRALAX / GLYCOLAX) packet, Take 17 g by mouth daily., Disp: 14 each, Rfl: 0 .  potassium chloride (KLOR-CON) 10 MEQ tablet, Take 2 tablets (20 mEq total) by mouth daily., Disp: 60 tablet, Rfl: 9 .  SPIRIVA HANDIHALER 18 MCG inhalation capsule, INHALE THE CONTENTS OF 1 CAPSULE VIA INHALATION  DEVICE EVERY DAY, Disp: 30 capsule, Rfl: 3 .  Talc (ZEASORB EX), Apply 1 application topically daily as needed (yeast infection under stomach). Under skin folds (abdomen), Disp: , Rfl:  .  temazepam (RESTORIL) 7.5 MG capsule, Take 1 capsule (7.5 mg total) by mouth at bedtime as needed for sleep., Disp: 30 capsule, Rfl: 0 .  traMADol (ULTRAM) 50 MG tablet, Take 1 tablet (50 mg total) by mouth 2 (two)  times daily as needed for moderate pain., Disp: 20 tablet, Rfl: 0 .  vitamin B-12 (CYANOCOBALAMIN) 500 MCG tablet, Take 500 mcg by mouth every other day. , Disp: , Rfl:  .  warfarin (COUMADIN) 5 MG tablet, TAKE 1-2 TABLETS DAILY AS DIRECTED BY COUMADIN CLINIC, Disp: 60 tablet, Rfl: 5 .  Zinc 50 MG CAPS, Take 1 capsule (50 mg total) by mouth daily., Disp: , Rfl: 0 No current facility-administered medications for this visit.  Facility-Administered Medications Ordered in Other Visits:  .  ondansetron (ZOFRAN) 4 mg in sodium chloride 0.9 % 50 mL IVPB, 4 mg, Intravenous, Q6H PRN, Kristeen Miss, MD      Objective:   Vitals:   07/14/20 0929  BP: 130/70  Pulse: 70  Temp: (!) 97.2 F (36.2 C)  TempSrc: Temporal  SpO2: 97%  Weight: 141 lb 3.2 oz (64 kg)  Height: 5\' 4"  (1.626 m)    Estimated body mass index is 24.24 kg/m as calculated from the following:   Height as of this encounter: 5\' 4"  (1.626 m).   Weight as of this encounter: 141 lb 3.2 oz (64 kg).  @WEIGHTCHANGE @  [employer]   07/14/20 0929  Weight: 141 lb 3.2 oz (64 kg)     Physical Exam General: No distress. think Neuro: Alert and Oriented x 3. GCS 15. Speech normal Psych: Pleasant Resp:  Barrel Chest - yes mild.  Wheeze - no, Crackles - no, No overt respiratory distress CVS: Normal heart sounds. Murmurs - no Ext: Stigmata of Connective Tissue Disease - no HEENT: Normal upper airway. PEERL +. No post nasal drip         Assessment:       ICD-10-CM   1. Multiple lung nodules on CT  R91.8   2. Moderate COPD (chronic  obstructive pulmonary disease) (HCC)  J44.9   3. Flu vaccine need  Z23   4. Vaccine counseling  Z71.85        Plan:     Patient Instructions     ICD-10-CM   1. Multiple lung nodules on CT  R91.8   2. Moderate COPD (chronic obstructive pulmonary disease) (HCC)  J44.9   3. Flu vaccine need  Z23   4. Vaccine counseling  Z71.85       Multiple lung nodules on CT - per radiologist these nodules are new since 2015 -> may 2019 -> waxing and waning July 2020   PLan - please repeat CT chest without contrast in 4-6 months  Moderate COPD (chronic obstructive pulmonary disease) (HCC) - stable - you can try Trelegy 200 daily instead of spiriva and breo scheduled   - if you like this let us know and we can send script in  Otherwise, continue spiriv, breo and singulair scheduled with duoneb as needed   - you could consider stopping singulair for a month and reassess  Flu Vaccine Need Vaccine Counseling  - high dose flu shot 07/14/2020 with coumadin clinic - covid booster next week  Followup - 6 months or sooner if needed but after CT chest     SIGNATURE    Dr. Brand Males, [name], F.C.C.P,  Pulmonary and Critical Care Medicine Staff Physician, [facility] Director - Interstitial Lung Disease  Program  Pulmonary [place] [facility] [place], [place], [address]  Pager: [phone], If no answer or between  15:00h - 7:00h: call [phone] Telephone: [phone]  9:45 AM  07/14/2020  

## 2020-07-14 NOTE — Patient Instructions (Addendum)
ICD-10-CM   1. Multiple lung nodules on CT  R91.8   2. Moderate COPD (chronic obstructive pulmonary disease) (HCC)  J44.9   3. Flu vaccine need  Z23   4. Vaccine counseling  Z71.85       Multiple lung nodules on CT - per radiologist these nodules are new since 2015 -> may 2019 -> waxing and waning July 2020   PLan - please repeat CT chest without contrast in 4-6 months  Moderate COPD (chronic obstructive pulmonary disease) ([place]) - stable - you can try Trelegy 200 daily instead of spiriva and breo scheduled   - if you like this let us know and we can send script in  Otherwise, continue spiriv, breo and singulair scheduled with duoneb as needed   - you could consider stopping singulair for a month and reassess  Flu Vaccine Need Vaccine Counseling  - high dose flu shot 07/14/2020 with coumadin clinic - covid booster next week  Followup - 6 months or sooner if needed but after CT chest

## 2020-07-14 NOTE — Patient Instructions (Addendum)
Pre visit review using our clinic review tool, if applicable. No additional management support is needed unless otherwise documented below in the visit note.  Increase dose today to 10mg  then continue 5 mg daily except take 2.5 mg on Mondays.  Recheck in 4 wks.

## 2020-07-14 NOTE — Progress Notes (Signed)
Pt requested high dose flu vaccine while here. Administered flu vaccine while here. Pt tolerated well.

## 2020-07-27 MED ORDER — TRELEGY ELLIPTA 200-62.5-25 MCG/INH IN AEPB: 1.0000 | INHALATION_SPRAY | Freq: Every day | RESPIRATORY_TRACT | 5 refills | Status: DC

## 2020-07-27 NOTE — Telephone Encounter (Signed)
MR, please see part of mychart message from pt which is posted below and advise:  Dr. [name],  It takes me a while to process information.  Please tell be what waxing and waning of pulmonary nodules actually means.  Is this something I should be worrying about?

## 2020-07-28 NOTE — Telephone Encounter (Signed)
[name]: thanks for sending trelegy\  Waxing and waning: means nodule size goes up and down and changes. Usually benign cause but to be sure we are repeating CT

## 2020-07-29 ENCOUNTER — Encounter: Payer: Self-pay | Admitting: Family Medicine

## 2020-07-29 ENCOUNTER — Ambulatory Visit (INDEPENDENT_AMBULATORY_CARE_PROVIDER_SITE_OTHER)
Admission: RE | Admit: 2020-07-29 | Discharge: 2020-07-29 | Disposition: A | Discharge status: Home / Self Care | Payer: Medicare Other | Source: Ambulatory Visit | Attending: Family Medicine | Admitting: Family Medicine

## 2020-07-29 ENCOUNTER — Other Ambulatory Visit: Payer: Self-pay

## 2020-07-29 ENCOUNTER — Ambulatory Visit (INDEPENDENT_AMBULATORY_CARE_PROVIDER_SITE_OTHER): Payer: Medicare Other | Admitting: Family Medicine

## 2020-07-29 VITALS — BP 136/78 | HR 73 | Temp 97.8°F | Ht 64.0 in | Wt 147.3 lb

## 2020-07-29 DIAGNOSIS — M25552 Pain in left hip: Secondary | ICD-10-CM | POA: Diagnosis not present

## 2020-07-29 DIAGNOSIS — W19XXXA Unspecified fall, initial encounter: Secondary | ICD-10-CM | POA: Diagnosis not present

## 2020-07-29 HISTORY — DX: Pain in left hip: M25.552

## 2020-07-29 NOTE — Assessment & Plan Note (Signed)
Fall at home suffered almost 2 wks ago with acute worsening L lateral hip pain over the past 4 days. At risk for complication given anticoagulant use. Check xrays today r/o hip/pelvic/femur fracture.  Treat pain with scheduled tylenol, ice/heating pad, gentle stretching.  Gabapentin started after fall.

## 2020-07-29 NOTE — Progress Notes (Signed)
This visit was conducted in person.  BP 136/78 (BP Location: Left Arm, Patient Position: Sitting, Cuff Size: Normal)   Pulse 73   Temp 97.8 F (36.6 C) (Temporal)   Ht 5\' 4"  (1.626 m)   Wt 147 lb 5 oz (66.8 kg)   SpO2 96%   BMI 25.29 kg/m    CC: fall Subjective:    Patient ID: [name], female    DOB: [birth year], 74 y.o.   MRN: [id number]  HPI: [name] is a 74 y.o. female presenting on 07/29/2020 for Fall (Pt fell on 07/17/20.  Has some sorness but improved.  Now has left hip pain that started 07/26/20. )   DOI: 07/17/2020 Fall at home while walking to bathroom. Doesn't remember tripping or what exactly happened. She landed on left side of body and scraped up forearms and L flank. Symptoms did seem to improve over time.  Now since Sunday 07/26/2020 notes marked worsening L hip pain - points to lateral hip with radiation down lateral leg to knee.   No bruising noted. No groin pain.  No new back pain.  Saw Dr Ellene Route Friday - started gabapentin for DISH pain, currently on 200mg  dose.      Relevant past medical, surgical, family and social history reviewed and updated as indicated. Interim medical history since our last visit reviewed. Allergies and medications reviewed and updated. Outpatient Medications Prior to Visit  Medication Sig Dispense Refill  . acetaminophen (TYLENOL) 500 MG tablet Take 2 tablets (1,000 mg total) by mouth daily.    Marland Kitchen albuterol (VENTOLIN HFA) 108 (90 Base) MCG/ACT inhaler Inhale 2 puffs into the lungs every 6 (six) hours as needed for wheezing. 18 g 6  . amoxicillin (AMOXIL) 500 MG capsule TAKE 4 CAPSULES BY MOUTH 30-60 MINUTES PRIOR TO APPOINTMENT 4 capsule 12  . Biotin 1000 MCG tablet Take 1,000 mcg by mouth daily.    . Calcium Carb-Cholecalciferol (CALCIUM 600+D) 600-800 MG-UNIT TABS Take 1 tablet by mouth every evening.    . Cholecalciferol (VITAMIN D) 2000 UNITS CAPS Take 2,000 Units by mouth every evening.     . docusate  sodium (COLACE) 100 MG capsule Take 200 mg by mouth every evening.    . Fluticasone-Umeclidin-Vilant (TRELEGY ELLIPTA) 200-62.5-25 MCG/INH AEPB Inhale 1 puff into the lungs daily. 60 each 5  . furosemide (LASIX) 40 MG tablet TAKE 1 TABLET(40 MG) BY MOUTH DAILY 30 tablet 11  . gabapentin (NEURONTIN) 100 MG capsule Take 300 mg by mouth at bedtime.    Marland Kitchen GEMTESA 75 MG TABS Take 1 tablet by mouth daily.    Marland Kitchen ipratropium-albuterol (DUONEB) 0.5-2.5 (3) MG/3ML SOLN Take 3 mLs by nebulization every 6 (six) hours as needed. (Patient taking differently: Take 3 mLs by nebulization every 6 (six) hours as needed (asthma). ) 360 mL 0  . lovastatin (MEVACOR) 40 MG tablet TAKE 1 TABLET(40 MG) BY MOUTH AT BEDTIME 90 tablet 3  . montelukast (SINGULAIR) 10 MG tablet TAKE 1 TABLET(10 MG) BY MOUTH AT BEDTIME 30 tablet 3  . Multiple Vitamins-Minerals (PRESERVISION AREDS 2 PO) Take 2 capsules by mouth every evening.     . polycarbophil (FIBERCON) 625 MG tablet Take 625 mg by mouth every evening.     . polyethylene glycol (MIRALAX / GLYCOLAX) packet Take 17 g by mouth daily. 14 each 0  . potassium chloride (KLOR-CON) 10 MEQ tablet Take 2 tablets (20 mEq total) by mouth daily. 60 tablet 9  . Talc (ZEASORB EX) Apply  1 application topically daily as needed (yeast infection under stomach). Under skin folds (abdomen)    . temazepam (RESTORIL) 7.5 MG capsule Take 1 capsule (7.5 mg total) by mouth at bedtime as needed for sleep. 30 capsule 0  . traMADol (ULTRAM) 50 MG tablet Take 1 tablet (50 mg total) by mouth 2 (two) times daily as needed for moderate pain. 20 tablet 0  . vitamin B-12 (CYANOCOBALAMIN) 500 MCG tablet Take 500 mcg by mouth every other day.     . warfarin (COUMADIN) 5 MG tablet TAKE 1-2 TABLETS DAILY AS DIRECTED BY COUMADIN CLINIC 60 tablet 5  . Zinc 50 MG CAPS Take 1 capsule (50 mg total) by mouth daily.  0   Facility-Administered Medications Prior to Visit  Medication Dose Route Frequency Provider Last Rate  Last Admin  . ondansetron (ZOFRAN) 4 mg in sodium chloride 0.9 % 50 mL IVPB  4 mg Intravenous Q6H PRN Kristeen Miss, MD         Per HPI unless specifically indicated in ROS section below Review of Systems Objective:  BP 136/78 (BP Location: Left Arm, Patient Position: Sitting, Cuff Size: Normal)   Pulse 73   Temp 97.8 F (36.6 C) (Temporal)   Ht 5\' 4"  (1.626 m)   Wt 147 lb 5 oz (66.8 kg)   SpO2 96%   BMI 25.29 kg/m   Wt Readings from Last 3 Encounters:  07/29/20 147 lb 5 oz (66.8 kg)  07/14/20 141 lb 3.2 oz (64 kg)  06/10/20 139 lb 7 oz (63.2 kg)      Physical Exam Vitals and nursing note reviewed.  Constitutional:      Appearance: Normal appearance. She is not ill-appearing.  Musculoskeletal:        General: Tenderness present. No swelling or deformity. Normal range of motion.     Right lower leg: No edema.     Left lower leg: No edema.     Comments:  No pain midline spine Neg SLR bilaterally, discomfort with testing on left. No pain with int/ext rotation at hip. No pain at SIJ, or sciatic notch bilaterally.  Tender with palpation of L trochanteric bursa and inferior to this No significant pain at ITband.   Skin:    General: Skin is warm and dry.     Findings: No rash.  Neurological:     Mental Status: She is alert.  Psychiatric:        Mood and Affect: Mood normal.        Behavior: Behavior normal.       Results for orders placed or performed in visit on 07/14/20  POCT INR  Result Value Ref Range   INR 2.3 2.0 - 3.0   *Note: Due to a large number of results and/or encounters for the requested time period, some results have not been displayed. A complete set of results can be found in Results Review.   Assessment & Plan:  This visit occurred during the SARS-CoV-2 public health emergency.  Safety protocols were in place, including screening questions prior to the visit, additional usage of staff PPE, and extensive cleaning of exam room while observing  appropriate contact time as indicated for disinfecting solutions.   Problem List Items Addressed This Visit    Lateral pain of left hip - Primary    Fall at home suffered almost 2 wks ago with acute worsening L lateral hip pain over the past 4 days. At risk for complication given anticoagulant use. Check xrays today  r/o hip/pelvic/femur fracture.  Treat pain with scheduled tylenol, ice/heating pad, gentle stretching.  Gabapentin started after fall.       Relevant Orders   DG Hip Unilat W OR W/O Pelvis 2-3 Views Left    Other Visit Diagnoses    Injury due to fall, initial encounter       Relevant Orders   DG Hip Unilat W OR W/O Pelvis 2-3 Views Left       No orders of the defined types were placed in this encounter.  Orders Placed This Encounter  Procedures  . DG Hip Unilat W OR W/O Pelvis 2-3 Views Left    Standing Status:   Future    Number of Occurrences:   1    Standing Expiration Date:   07/29/2021    Order Specific Question:   Reason for Exam (SYMPTOM  OR DIAGNOSIS REQUIRED)    Answer:   L lateral hip pain after fall    Order Specific Question:   Preferred imaging location?    Answer:   [facility]    Patient Instructions  xrays today overall ok.   Treat pain with scheduled tylenol 500mg  three times daily for the next week.  Possible bursitis - look at exercises provided today.  May use ice/heating pad to lateral hip.  Let us know if worsening pain or not improving as expected.    Follow up plan: Return if symptoms worsen or fail to improve.  Ria Bush, MD

## 2020-07-29 NOTE — Patient Instructions (Addendum)
xrays today overall ok.   Treat pain with scheduled tylenol 500mg  three times daily for the next week.  Possible bursitis - look at exercises provided today.  May use ice/heating pad to lateral hip.  Let us know if worsening pain or not improving as expected.

## 2020-07-31 ENCOUNTER — Ambulatory Visit (INDEPENDENT_AMBULATORY_CARE_PROVIDER_SITE_OTHER): Payer: Medicare Other

## 2020-07-31 DIAGNOSIS — I441 Atrioventricular block, second degree: Secondary | ICD-10-CM

## 2020-08-01 LAB — CUP PACEART REMOTE DEVICE CHECK
Battery Remaining Longevity: 113 mo
Battery Remaining Percentage: 95.5 %
Battery Voltage: 2.98 V
Brady Statistic AP VP Percent: 78 %
Brady Statistic AP VS Percent: 1 %
Brady Statistic AS VP Percent: 22 %
Brady Statistic AS VS Percent: 1 %
Brady Statistic RA Percent Paced: 33 %
Brady Statistic RV Percent Paced: 99 %
Date Time Interrogation Session: 20211022070619
Implantable Lead Implant Date: 20170907
Implantable Lead Implant Date: 20170907
Implantable Lead Location: 753859
Implantable Lead Location: 753860
Implantable Pulse Generator Implant Date: 20170907
Lead Channel Impedance Value: 390 Ohm
Lead Channel Impedance Value: 530 Ohm
Lead Channel Pacing Threshold Amplitude: 0.5 V
Lead Channel Pacing Threshold Amplitude: 0.625 V
Lead Channel Pacing Threshold Pulse Width: 0.5 ms
Lead Channel Pacing Threshold Pulse Width: 0.5 ms
Lead Channel Sensing Intrinsic Amplitude: 12 mV
Lead Channel Sensing Intrinsic Amplitude: 3.7 mV
Lead Channel Setting Pacing Amplitude: 0.875
Lead Channel Setting Pacing Amplitude: 2 V
Lead Channel Setting Pacing Pulse Width: 0.5 ms
Lead Channel Setting Sensing Sensitivity: 2 mV
Pulse Gen Model: 2272
Pulse Gen Serial Number: 7945283

## 2020-08-05 NOTE — Progress Notes (Signed)
Remote pacemaker transmission.   

## 2020-08-06 NOTE — Progress Notes (Signed)
Noted  

## 2020-08-11 ENCOUNTER — Other Ambulatory Visit: Payer: Self-pay

## 2020-08-11 ENCOUNTER — Ambulatory Visit (INDEPENDENT_AMBULATORY_CARE_PROVIDER_SITE_OTHER): Payer: Medicare Other

## 2020-08-11 DIAGNOSIS — Z7901 Long term (current) use of anticoagulants: Secondary | ICD-10-CM

## 2020-08-11 LAB — POCT INR: INR: 4.4 — AB (ref 2.0–3.0)

## 2020-08-11 NOTE — Patient Instructions (Signed)
Patient has already taken warfarin dose today, will Hold coumadin tomorrow 08/12/20 and take 1/2 dose (2.5mg ) on Thursday 08/13/20 then decrease weekly dose to 5mg  daily EXCEPT for 1/2 (2.5mg ) on Mondays and Fridays.  Recheck in 2 weeks.   Patient does report that she has been taking increased doses of Tylenol and has started a new RX of gabapentin in the last 2 weeks.  She continues to wean down on Tylenol usage.  Patient denies any abnormal bleeding and understands risks involved with supratherapeutic level, she will go to ER if any concerns develop.

## 2020-08-19 ENCOUNTER — Other Ambulatory Visit: Payer: Self-pay | Admitting: Family Medicine

## 2020-08-19 NOTE — Telephone Encounter (Signed)
Pharmacy requests refill on: warfarin SOD 5 mg  LAST REFILL: 11/04/2019 LAST OV: 07/29/2020 NEXT OV: 12/08/2020 PHARMACY: [facility] [place], [place]

## 2020-08-24 ENCOUNTER — Other Ambulatory Visit: Payer: Self-pay | Admitting: Family Medicine

## 2020-08-24 NOTE — Telephone Encounter (Signed)
ERx 

## 2020-08-25 ENCOUNTER — Ambulatory Visit (INDEPENDENT_AMBULATORY_CARE_PROVIDER_SITE_OTHER): Payer: Medicare Other

## 2020-08-25 ENCOUNTER — Other Ambulatory Visit: Payer: Self-pay

## 2020-08-25 DIAGNOSIS — Z7901 Long term (current) use of anticoagulants: Secondary | ICD-10-CM

## 2020-08-25 LAB — POCT INR: INR: 3.3 — AB (ref 2.0–3.0)

## 2020-08-25 NOTE — Patient Instructions (Addendum)
Pre visit review using our clinic review tool, if applicable. No additional management support is needed unless otherwise documented below in the visit note.   Patient has already taken warfarin dose today, will Hold coumadin tomorrow 08/26/20. Take 5 mg daily EXCEPT for 1/2 (2.5mg ) on Mondays, Wednesdays and Fridays.  Recheck in  4 weeks.

## 2020-09-08 NOTE — Progress Notes (Addendum)
Noted. Lab Results  Component Value Date   INR 3.3 (A) 08/25/2020   INR 4.4 (A) 08/11/2020   INR 2.3 07/14/2020

## 2020-09-13 ENCOUNTER — Other Ambulatory Visit: Payer: Self-pay | Admitting: Internal Medicine

## 2020-09-24 ENCOUNTER — Ambulatory Visit: Payer: Medicare Other

## 2020-09-25 ENCOUNTER — Other Ambulatory Visit: Payer: Self-pay | Admitting: Family Medicine

## 2020-09-25 NOTE — Telephone Encounter (Signed)
ERx 

## 2020-09-25 NOTE — Telephone Encounter (Signed)
Name of Medication: Tramadol Name of Pharmacy: [facility] or Written Date and Quantity: 08/24/20, #20 Last Office Visit and Type: 10/20/212, L hip pain- fall Next Office Visit and Type: 12/08/20, 6 mo f/u Last Controlled Substance Agreement Date: none Last UDS: none

## 2020-09-30 DIAGNOSIS — R030 Elevated blood-pressure reading, without diagnosis of hypertension: Secondary | ICD-10-CM | POA: Insufficient documentation

## 2020-10-01 ENCOUNTER — Other Ambulatory Visit: Payer: Self-pay

## 2020-10-01 ENCOUNTER — Ambulatory Visit (INDEPENDENT_AMBULATORY_CARE_PROVIDER_SITE_OTHER): Payer: Medicare Other

## 2020-10-01 DIAGNOSIS — Z7901 Long term (current) use of anticoagulants: Secondary | ICD-10-CM

## 2020-10-01 LAB — POCT INR: INR: 1.5 — AB (ref 2.0–3.0)

## 2020-10-01 NOTE — Patient Instructions (Addendum)
Pre visit review using our clinic review tool, if applicable. No additional management support is needed unless otherwise documented below in the visit note.  Take 10mg  today and then change weekly dose to take 5mg  daily except take 2.5mg  on Wednesdays. Recheck in  1 weeks.

## 2020-10-05 NOTE — Progress Notes (Signed)
Agree. Thanks

## 2020-10-07 ENCOUNTER — Other Ambulatory Visit: Payer: Self-pay

## 2020-10-08 ENCOUNTER — Ambulatory Visit (INDEPENDENT_AMBULATORY_CARE_PROVIDER_SITE_OTHER): Payer: Medicare Other

## 2020-10-08 DIAGNOSIS — Z7901 Long term (current) use of anticoagulants: Secondary | ICD-10-CM

## 2020-10-08 LAB — POCT INR: INR: 2.6 (ref 2.0–3.0)

## 2020-10-08 NOTE — Patient Instructions (Addendum)
Pre visit review using our clinic review tool, if applicable. No additional management support is needed unless otherwise documented below in the visit note.  Continue 5mg  daily except take 2.5mg  on Wednesdays. Recheck in 4 weeks.

## 2020-10-27 ENCOUNTER — Other Ambulatory Visit: Payer: Self-pay | Admitting: Family Medicine

## 2020-10-27 NOTE — Telephone Encounter (Signed)
Requesting:tramadol Contract:no VPX:TGGY Last OV:07/29/20 Next OV:12/09/19 Last Refill:09/25/20 #[id number] Database:   Please advise

## 2020-10-28 NOTE — Telephone Encounter (Signed)
ERx 

## 2020-10-30 ENCOUNTER — Ambulatory Visit (INDEPENDENT_AMBULATORY_CARE_PROVIDER_SITE_OTHER): Payer: Medicare Other

## 2020-10-30 DIAGNOSIS — I441 Atrioventricular block, second degree: Secondary | ICD-10-CM

## 2020-10-30 LAB — CUP PACEART REMOTE DEVICE CHECK
Battery Remaining Longevity: 113 mo
Battery Remaining Percentage: 95.5 %
Battery Voltage: 2.98 V
Brady Statistic AP VP Percent: 77 %
Brady Statistic AP VS Percent: 1 %
Brady Statistic AS VP Percent: 23 %
Brady Statistic AS VS Percent: 1 %
Brady Statistic RA Percent Paced: 30 %
Brady Statistic RV Percent Paced: 99 %
Date Time Interrogation Session: 20220121071121
Implantable Lead Implant Date: 20170907
Implantable Lead Implant Date: 20170907
Implantable Lead Location: 753859
Implantable Lead Location: 753860
Implantable Pulse Generator Implant Date: 20170907
Lead Channel Impedance Value: 390 Ohm
Lead Channel Impedance Value: 490 Ohm
Lead Channel Pacing Threshold Amplitude: 0.5 V
Lead Channel Pacing Threshold Amplitude: 0.625 V
Lead Channel Pacing Threshold Pulse Width: 0.5 ms
Lead Channel Pacing Threshold Pulse Width: 0.5 ms
Lead Channel Sensing Intrinsic Amplitude: 1.3 mV
Lead Channel Sensing Intrinsic Amplitude: 12 mV
Lead Channel Setting Pacing Amplitude: 0.875
Lead Channel Setting Pacing Amplitude: 2 V
Lead Channel Setting Pacing Pulse Width: 0.5 ms
Lead Channel Setting Sensing Sensitivity: 2 mV
Pulse Gen Model: 2272
Pulse Gen Serial Number: 7945283

## 2020-11-02 ENCOUNTER — Telehealth: Payer: Self-pay | Admitting: Family Medicine

## 2020-11-02 NOTE — Telephone Encounter (Signed)
Pt called in wanted to cancel her appointment on thursday

## 2020-11-02 NOTE — Telephone Encounter (Signed)
RS pt for 11/10/20

## 2020-11-05 ENCOUNTER — Ambulatory Visit: Payer: Medicare Other

## 2020-11-05 ENCOUNTER — Other Ambulatory Visit: Payer: Self-pay | Admitting: Family Medicine

## 2020-11-05 DIAGNOSIS — Z1231 Encounter for screening mammogram for malignant neoplasm of breast: Secondary | ICD-10-CM

## 2020-11-10 ENCOUNTER — Ambulatory Visit (INDEPENDENT_AMBULATORY_CARE_PROVIDER_SITE_OTHER): Payer: Medicare Other

## 2020-11-10 ENCOUNTER — Other Ambulatory Visit: Payer: Self-pay

## 2020-11-10 DIAGNOSIS — Z7901 Long term (current) use of anticoagulants: Secondary | ICD-10-CM | POA: Diagnosis not present

## 2020-11-10 LAB — POCT INR: INR: 1.6 — AB (ref 2.0–3.0)

## 2020-11-10 NOTE — Patient Instructions (Addendum)
Pre visit review using our clinic review tool, if applicable. No additional management support is needed unless otherwise documented below in the visit note.  Increase dose today and tomorrow to take 10 mg today (one more pill when you get home since you have already taken dose this morning) and 5 mg (1 pill) tomorrow then resume normal dose of 5mg  daily except take 2.5mg  on Wednesdays. Recheck in 2 weeks.

## 2020-11-11 NOTE — Progress Notes (Signed)
Remote pacemaker transmission.   

## 2020-11-26 ENCOUNTER — Ambulatory Visit (INDEPENDENT_AMBULATORY_CARE_PROVIDER_SITE_OTHER): Payer: Medicare Other

## 2020-11-26 ENCOUNTER — Other Ambulatory Visit: Payer: Self-pay

## 2020-11-26 DIAGNOSIS — Z7901 Long term (current) use of anticoagulants: Secondary | ICD-10-CM | POA: Diagnosis not present

## 2020-11-26 LAB — POCT INR: INR: 1.7 — AB (ref 2.0–3.0)

## 2020-11-26 NOTE — Progress Notes (Signed)
Agree. Thanks

## 2020-11-26 NOTE — Patient Instructions (Addendum)
Pre visit review using our clinic review tool, if applicable. No additional management support is needed unless otherwise documented below in the visit note.  Increase dose today and tomorrow to take 10 mg today then change weekly dose to take 5mg  daily except take 7.5mg  on Wednesdays. Recheck in 1 weeks.

## 2020-12-01 ENCOUNTER — Other Ambulatory Visit: Payer: Self-pay | Admitting: Family Medicine

## 2020-12-01 NOTE — Telephone Encounter (Signed)
Name of Medication: Tramadol Name of Pharmacy: [facility] or Written Date and Quantity: 10/28/20, #20 Last Office Visit and Type: 07/29/20, L hip pain Next Office Visit and Type: 12/08/20, 6 mo f/u Last Controlled Substance Agreement Date: none Last UDS: none

## 2020-12-02 ENCOUNTER — Encounter: Payer: Self-pay | Admitting: Podiatry

## 2020-12-02 ENCOUNTER — Other Ambulatory Visit: Payer: Self-pay

## 2020-12-02 ENCOUNTER — Ambulatory Visit (INDEPENDENT_AMBULATORY_CARE_PROVIDER_SITE_OTHER): Payer: Medicare Other | Admitting: Podiatry

## 2020-12-02 DIAGNOSIS — L600 Ingrowing nail: Secondary | ICD-10-CM

## 2020-12-02 MED ORDER — NEOMYCIN-POLYMYXIN-HC 1 % OT SOLN: OTIC | 1 refills | Status: DC

## 2020-12-02 NOTE — Progress Notes (Signed)
Subjective:  Patient ID: [name], female    DOB: January 13, 1946,  MRN: [id number] HPI Chief Complaint  Patient presents with  . Nail Problem    Patient presents today for ingrown toenail left hallux both borders x months off and on.  She says they are sore to touch now.  She also c/o painful callous to left 5th sub met     75 y.o. female presents with the above complaint.   ROS: She denies fever chills nausea vomiting muscle aches pains calf pain back pain chest pain shortness of breath.  Past Medical History:  Diagnosis Date  . Acute cystitis without hematuria 12/22/2015  . Anemia   . Arthritis   . CAP (community acquired pneumonia) 02/08/2016  . CAP (community acquired pneumonia) 02/08/2016  . Cataracts, bilateral    immature  . Chronic cholecystitis with calculus 01/21/2015   S/p cholecystectomy   . Chronic diastolic heart failure ([place]) 09/19/2014  . Chronic insomnia   . Constipation    takes Miralax daily as needed  . COPD (chronic obstructive pulmonary disease) (HCC)    Albuterol inhaler daily as needed;Duoneb daily as needed;Spiriva daily  . DDD (degenerative disc disease)    cervical - kyphosis with mod DD changes C5/6 and C6/7; lumbar - early DD at L2/3 ([place])  . Dyspnea    with exertion  . Essential hypertension    was on meds but after appointment Dec 11 with Dr.C he took her off  . GERD (gastroesophageal reflux disease)    takes Protonix daily  . History of pneumonia 02/16/2016  . History of radiation therapy 05/30/11 to 07/07/11   rectum  . History of shingles   . Hyperlipemia    takes Lovastatin daily  . Legally blind in left eye, as defined in [place]   . Lung nodule    RLL nodule-30mm stable 2006, April 2009, and June 2009  . Obesity   . Osteopenia 12/2014   T -1.5 hip  . Pancreatitis 11/2014   ?zpack related vs gallstone pancreatitis with abnormal HIDA scan pending cholecystectomy  . PONV (postoperative nausea and vomiting)   . Presence of permanent  cardiac pacemaker   . Rectal cancer ([place]) 04/2011   T3N0; s/p lap LAR, s/p ileostomy, s/p reversal, s/p chemo  . Research study patient 08/26/2016  . Rheumatic heart disease mitral stenosis    mod MS by echo 02/2014  . S/P AVR (aortic valve replacement) 2015   bioprosthetic ([place])  . S/P MVR (mitral valve replacement) 2015   bioprosthetic ([place])  . Seizures ([place])    febrile seizures as a child  . Sepsis secondary to UTI ([place]) 08/22/2017  . Severe aortic valve stenosis  AVR 3/15 10/01/2013   mild-mod by echo 02/2014  . Sleep apnea    PT TOLD BORDERLINE-TRIED CPAP-DID NOT HELP-SHE DOES NOT USE CPAP  . Small bowel obstruction, partial ([place]) 08/2013   reolved without NGT placement.   . Vitamin B12 deficiency 02/28/2015   Start B12 shots 02/2015    Past Surgical History:  Procedure Laterality Date  . ANTERIOR LAT LUMBAR FUSION Left 07/17/2018   Procedure: Lumbar Two-Three Lumbar Three-Four Anterolateral decompression/interbody fusion with lateral plate fixation/Infuse;  Surgeon: Kristeen Miss, MD;  Location: [facility];  Service: Neurosurgery;  Laterality: Left;  Lumbar Two-Three Lumbar Three-Four Anterolateral decompression/interbody fusion with lateral plate fixation/Infuse  . AORTIC VALVE REPLACEMENT N/A 12/12/2013   Procedure: AORTIC VALVE REPLACEMENT (AVR);  Surgeon: Gaye Pollack, MD; Service: Open Heart Surgery  .  BACK SURGERY  2006, 2007   2006 SPACER, 2007 decompression and fusion L4/5  . BOWEL RESECTION  04/02/2012   Procedure: SMALL BOWEL RESECTION;  Surgeon: Stark Klein, MD;  Location: [facility];  Service: General;  Laterality: N/A;  . CARPAL TUNNEL RELEASE Bilateral   . CATARACT EXTRACTION W/ INTRAOCULAR LENS IMPLANT Left 10/18/2016  . CATARACT EXTRACTION W/ INTRAOCULAR LENS IMPLANT Right 12/13/2016   Dr. Kathrin Penner  . CHOLECYSTECTOMY N/A 01/21/2015   chronic cholecystitis, Stark Klein, MD  . COLON RESECTION  08/25/2011   Procedure: COLON RESECTION LAPAROSCOPIC;  Surgeon: Stark Klein, MD;  Location: [facility];  Service: General;  Laterality: N/A;  Laparoscopic Assisted Low Anterior Resection Diverting Ostomy and onQ pain pump  . COLONOSCOPY  03/2013   1 polyp, rpt 3 yrs Ardis Hughs)  . COLONOSCOPY WITH PROPOFOL N/A 06/09/2016   patent colo-colonic anastomosis, rpt 5 yrs Ardis Hughs)  . EP IMPLANTABLE DEVICE N/A 06/16/2016   Procedure: Pacemaker Implant;  Surgeon: Will Meredith Leeds, MD;  Location: [facility];  Service: Cardiovascular;  Laterality: N/A;  . FOOT SURGERY  left foot   hammer toe  . HAND SURGERY  Bil   carpal tunnel  . ILEOSTOMY  08/25/2011  . ILEOSTOMY CLOSURE  04/02/2012   Procedure: ILEOSTOMY TAKEDOWN;  Surgeon: Stark Klein, MD;  Location: [facility];  Service: General;  Laterality: N/A;  . INTRAOPERATIVE TRANSESOPHAGEAL ECHOCARDIOGRAM N/A 12/12/2013   Procedure: INTRAOPERATIVE TRANSESOPHAGEAL ECHOCARDIOGRAM;  Surgeon: Gaye Pollack, MD;  Location: [facility];  Service: Open Heart Surgery;  Laterality: N/A;  . LEFT AND RIGHT HEART CATHETERIZATION WITH CORONARY ANGIOGRAM N/A 11/27/2013   Procedure: LEFT AND RIGHT HEART CATHETERIZATION WITH CORONARY ANGIOGRAM;  Surgeon: Sanda Klein, MD;  Location: [facility];  Service: Cardiovascular;  Laterality: N/A;  . MITRAL VALVE REPLACEMENT N/A 12/12/2013   Procedure: MITRAL VALVE (MV) REPLACEMENT OR REPAIR;  Surgeon: Gaye Pollack, MD; Service: Open Heart Surgery  . TEE WITHOUT CARDIOVERSION N/A 11/29/2013   Procedure: TRANSESOPHAGEAL ECHOCARDIOGRAM (TEE);  Surgeon: Sueanne Margarita, MD;  Location: [facility];  Service: Cardiovascular;  Laterality: N/A;  . TEE WITHOUT CARDIOVERSION N/A 06/04/2019   Procedure: TRANSESOPHAGEAL ECHOCARDIOGRAM (TEE);  Surgeon: Sanda Klein, MD;  Location: [facility];  Service: Cardiovascular;  Laterality: N/A;  . TONSILLECTOMY      Current Outpatient Medications:  .  NEOMYCIN-POLYMYXIN-HYDROCORTISONE (CORTISPORIN) 1 % SOLN OTIC solution, Apply 1-2 drops to toe BID after soaking, Disp: 10  mL, Rfl: 1 .  traMADol (ULTRAM) 50 MG tablet, TAKE 1 TABLET(50 MG) BY MOUTH TWICE DAILY AS NEEDED FOR MODERATE PAIN, Disp: 20 tablet, Rfl: 0 .  acetaminophen (TYLENOL) 500 MG tablet, Take 2 tablets (1,000 mg total) by mouth daily., Disp: , Rfl:  .  albuterol (VENTOLIN HFA) 108 (90 Base) MCG/ACT inhaler, Inhale 2 puffs into the lungs every 6 (six) hours as needed for wheezing., Disp: 18 g, Rfl: 6 .  amoxicillin (AMOXIL) 500 MG capsule, TAKE 4 CAPSULES BY MOUTH 30-60 MINUTES PRIOR TO APPOINTMENT, Disp: 4 capsule, Rfl: 12 .  Biotin 1000 MCG tablet, Take 1,000 mcg by mouth daily., Disp: , Rfl:  .  Calcium Carb-Cholecalciferol (CALCIUM 600+D) 600-800 MG-UNIT TABS, Take 1 tablet by mouth every evening., Disp: , Rfl:  .  Cholecalciferol (VITAMIN D) 2000 UNITS CAPS, Take 2,000 Units by mouth every evening. , Disp: , Rfl:  .  docusate sodium (COLACE) 100 MG capsule, Take 200 mg by mouth every evening., Disp: , Rfl:  .  Fluticasone-Umeclidin-Vilant (TRELEGY ELLIPTA) 200-62.5-25  MCG/INH AEPB, Inhale 1 puff into the lungs daily., Disp: 60 each, Rfl: 5 .  furosemide (LASIX) 40 MG tablet, TAKE 1 TABLET(40 MG) BY MOUTH DAILY, Disp: 30 tablet, Rfl: 11 .  gabapentin (NEURONTIN) 100 MG capsule, Take 300 mg by mouth at bedtime., Disp: , Rfl:  .  GEMTESA 75 MG TABS, Take 1 tablet by mouth daily., Disp: , Rfl:  .  ipratropium-albuterol (DUONEB) 0.5-2.5 (3) MG/3ML SOLN, Take 3 mLs by nebulization every 6 (six) hours as needed. (Patient taking differently: Take 3 mLs by nebulization every 6 (six) hours as needed (asthma). ), Disp: 360 mL, Rfl: 0 .  lovastatin (MEVACOR) 40 MG tablet, TAKE 1 TABLET(40 MG) BY MOUTH AT BEDTIME, Disp: 90 tablet, Rfl: 3 .  montelukast (SINGULAIR) 10 MG tablet, TAKE 1 TABLET(10 MG) BY MOUTH AT BEDTIME, Disp: 30 tablet, Rfl: 3 .  Multiple Vitamins-Minerals (PRESERVISION AREDS 2 PO), Take 2 capsules by mouth every evening. , Disp: , Rfl:  .  nitrofurantoin (MACRODANTIN) 100 MG capsule, Take 100 mg  by mouth daily., Disp: , Rfl:  .  polycarbophil (FIBERCON) 625 MG tablet, Take 625 mg by mouth every evening. , Disp: , Rfl:  .  polyethylene glycol (MIRALAX / GLYCOLAX) packet, Take 17 g by mouth daily., Disp: 14 each, Rfl: 0 .  potassium chloride (KLOR-CON) 10 MEQ tablet, Take 2 tablets (20 mEq total) by mouth daily., Disp: 60 tablet, Rfl: 9 .  Talc (ZEASORB EX), Apply 1 application topically daily as needed (yeast infection under stomach). Under skin folds (abdomen), Disp: , Rfl:  .  temazepam (RESTORIL) 7.5 MG capsule, Take 1 capsule (7.5 mg total) by mouth at bedtime as needed for sleep., Disp: 30 capsule, Rfl: 0 .  vitamin B-12 (CYANOCOBALAMIN) 500 MCG tablet, Take 500 mcg by mouth every other day. , Disp: , Rfl:  .  warfarin (COUMADIN) 5 MG tablet, TAKE 1 TO 2 TABLETS BY MOUTH DAILY AS DIRECTED BY COUMADIN CLINIC, Disp: 60 tablet, Rfl: 5 .  Zinc 50 MG CAPS, Take 1 capsule (50 mg total) by mouth daily., Disp: , Rfl: 0 No current facility-administered medications for this visit.  Facility-Administered Medications Ordered in Other Visits:  .  ondansetron (ZOFRAN) 4 mg in sodium chloride 0.9 % 50 mL IVPB, 4 mg, Intravenous, Q6H PRN, Kristeen Miss, MD  Allergies  Allergen Reactions  . Codeine Nausea Only  . Sulfa Antibiotics Nausea Only  . Sulfonamide Derivatives Nausea Only   Review of Systems Objective:  There were no vitals filed for this visit.  General: Well developed, nourished, in no acute distress, alert and oriented x3   Dermatological: Skin is warm, dry and supple bilateral. Nails x 10 are well maintained; remaining integument appears unremarkable at this time. There are no open sores, no preulcerative lesions, no rash or signs of infection present.  Sharp incurvated nail margin along the tibiofibular border the hallux left.  Mild erythema no cellulitis drainage or odor.   Vascular: Dorsalis Pedis artery and Posterior Tibial artery pedal pulses are 2/4 bilateral with immedate  capillary fill time. Pedal hair growth present. No varicosities and no lower extremity edema present bilateral.   Neruologic: Grossly intact via light touch bilateral. Vibratory intact via tuning fork bilateral. Protective threshold with Semmes Wienstein monofilament intact to all pedal sites bilateral. Patellar and Achilles deep tendon reflexes 2+ bilateral. No Babinski or clonus noted bilateral.   Musculoskeletal: No gross boney pedal deformities bilateral. No pain, crepitus, or limitation noted with foot and ankle range  of motion bilateral. Muscular strength 5/5 in all groups tested bilateral.  Gait: Unassisted, Nonantalgic.    Radiographs:  None taken  Assessment & Plan:   Assessment: Ingrown toenail tibiofibular border hallux left.  Plan: Discussed etiology pathology conservative surgical therapies at this point performed a chemical matricectomy after local anesthetic was administered.  Placed Surgicel to help with hemostasis because of her Coumadin.  She had no bleeding when she was leaving the office.  Tolerated procedure well was provided with both oral and written home-going instructions as well as a prescription for Corticosporin otic to be applied twice daily after soaking.  I will follow-up with her 2 weeks     Sarah Zerby T. [place], [place]

## 2020-12-02 NOTE — Patient Instructions (Signed)

## 2020-12-03 ENCOUNTER — Ambulatory Visit (INDEPENDENT_AMBULATORY_CARE_PROVIDER_SITE_OTHER): Payer: Medicare Other

## 2020-12-03 ENCOUNTER — Other Ambulatory Visit: Payer: Self-pay

## 2020-12-03 DIAGNOSIS — Z7901 Long term (current) use of anticoagulants: Secondary | ICD-10-CM

## 2020-12-03 LAB — POCT INR: INR: 3.2 — AB (ref 2.0–3.0)

## 2020-12-03 NOTE — Telephone Encounter (Signed)
ERx 

## 2020-12-03 NOTE — Patient Instructions (Addendum)
Pre visit review using our clinic review tool, if applicable. No additional management support is needed unless otherwise documented below in the visit note.  HOLD dose tomorrow since you have already taken todays dose and then continue weekly dose to take 5mg  daily except take 7.5mg  on Wednesdays. Recheck in 3 weeks.

## 2020-12-06 NOTE — Progress Notes (Signed)
Agree. Thanks

## 2020-12-08 ENCOUNTER — Other Ambulatory Visit: Payer: Self-pay

## 2020-12-08 ENCOUNTER — Ambulatory Visit (INDEPENDENT_AMBULATORY_CARE_PROVIDER_SITE_OTHER): Payer: Medicare Other | Admitting: Family Medicine

## 2020-12-08 ENCOUNTER — Encounter: Payer: Self-pay | Admitting: Family Medicine

## 2020-12-08 VITALS — BP 128/78 | HR 77 | Temp 97.7°F | Ht 64.0 in | Wt 140.0 lb

## 2020-12-08 DIAGNOSIS — M545 Low back pain, unspecified: Secondary | ICD-10-CM | POA: Diagnosis not present

## 2020-12-08 DIAGNOSIS — I6529 Occlusion and stenosis of unspecified carotid artery: Secondary | ICD-10-CM

## 2020-12-08 DIAGNOSIS — G8929 Other chronic pain: Secondary | ICD-10-CM

## 2020-12-08 DIAGNOSIS — R413 Other amnesia: Secondary | ICD-10-CM | POA: Diagnosis not present

## 2020-12-08 DIAGNOSIS — J449 Chronic obstructive pulmonary disease, unspecified: Secondary | ICD-10-CM | POA: Diagnosis not present

## 2020-12-08 DIAGNOSIS — Z952 Presence of prosthetic heart valve: Secondary | ICD-10-CM

## 2020-12-08 DIAGNOSIS — N3946 Mixed incontinence: Secondary | ICD-10-CM

## 2020-12-08 DIAGNOSIS — I4819 Other persistent atrial fibrillation: Secondary | ICD-10-CM

## 2020-12-08 HISTORY — DX: Occlusion and stenosis of unspecified carotid artery: I65.29

## 2020-12-08 NOTE — Patient Instructions (Addendum)
Good to see you today We will refer you for carotid ultrasound for evaluation of carotid artery plaque.  Continue current medicines.  Let me know if recurrent trouble with periods of memory loss to check CT of head.  Return as needed or in 6 months for physical.
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
## 2020-12-09 DIAGNOSIS — I4819 Other persistent atrial fibrillation: Secondary | ICD-10-CM

## 2020-12-09 DIAGNOSIS — I4811 Longstanding persistent atrial fibrillation: Secondary | ICD-10-CM | POA: Insufficient documentation

## 2020-12-09 DIAGNOSIS — I4891 Unspecified atrial fibrillation: Secondary | ICD-10-CM | POA: Insufficient documentation

## 2020-12-09 HISTORY — DX: Other persistent atrial fibrillation: I48.19

## 2020-12-09 NOTE — Assessment & Plan Note (Signed)
Appreciate uro care ([facility]) now on [place] Not PTNS candidate due to pacemaker status

## 2020-12-09 NOTE — Assessment & Plan Note (Signed)
Latest pacer check showed persistent atrial fibrillation - continue coumadin and cards f/u.

## 2020-12-09 NOTE — Assessment & Plan Note (Signed)
Significant benefit on trelegy, happy with change.

## 2020-12-09 NOTE — Assessment & Plan Note (Signed)
Continue coumadin, goal INR 2.5-3.

## 2020-12-09 NOTE — Assessment & Plan Note (Signed)
No bruit appreciated (in murmur history) Looks like latest carotid imaging was 2015.  Will update imaging.

## 2020-12-09 NOTE — Assessment & Plan Note (Signed)
Regularly sees neurosurgery [facility]).  No surgical intervention.  Discussed tramadol dosing - will increase # to 30/mo.

## 2020-12-09 NOTE — Assessment & Plan Note (Signed)
MMSE 30/30 (06/2020).  No recent episodes of amnesia.  Discussed if recurrence low threshold to check head imaging - likely head CT given pacemaker status

## 2020-12-21 ENCOUNTER — Other Ambulatory Visit: Payer: Self-pay

## 2020-12-21 ENCOUNTER — Encounter: Payer: Self-pay | Admitting: Podiatry

## 2020-12-21 ENCOUNTER — Ambulatory Visit (INDEPENDENT_AMBULATORY_CARE_PROVIDER_SITE_OTHER): Payer: Medicare Other | Admitting: Podiatry

## 2020-12-21 DIAGNOSIS — L03032 Cellulitis of left toe: Secondary | ICD-10-CM | POA: Diagnosis not present

## 2020-12-21 MED ORDER — SILVER SULFADIAZINE 1 % EX CREA: 1.0000 "application " | TOPICAL_CREAM | Freq: Every day | CUTANEOUS | 1 refills | Status: DC
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
## 2020-12-24 ENCOUNTER — Other Ambulatory Visit: Payer: Self-pay

## 2020-12-24 ENCOUNTER — Ambulatory Visit (INDEPENDENT_AMBULATORY_CARE_PROVIDER_SITE_OTHER): Payer: Medicare Other

## 2020-12-24 DIAGNOSIS — Z7901 Long term (current) use of anticoagulants: Secondary | ICD-10-CM

## 2020-12-24 LAB — POCT INR: INR: 2.7 (ref 2.0–3.0)

## 2020-12-24 NOTE — Patient Instructions (Addendum)
Pre visit review using our clinic review tool, if applicable. No additional management support is needed unless otherwise documented below in the visit note.  Continue weekly dose to take 5mg  daily except take 7.5mg  on Wednesdays. Recheck in 4 weeks.

## 2020-12-26 ENCOUNTER — Other Ambulatory Visit: Payer: Self-pay | Admitting: Family Medicine

## 2020-12-27 NOTE — Progress Notes (Signed)
Agree. Thanks

## 2020-12-28 NOTE — Telephone Encounter (Signed)
ERx 

## 2020-12-28 NOTE — Telephone Encounter (Signed)
Name of Medication: Tramadol Name of Pharmacy:  [facility] or Written Date and Quantity: 12/03/20 #20 Last Office Visit and Type: 12/08/20 Next Office Visit and Type: 06/16/21 Last Controlled Substance Agreement Date: none Last [id number]

## 2020-12-29 ENCOUNTER — Other Ambulatory Visit: Payer: Self-pay

## 2020-12-29 ENCOUNTER — Ambulatory Visit (HOSPITAL_COMMUNITY)
Admission: RE | Admit: 2020-12-29 | Discharge: 2020-12-29 | Disposition: A | Discharge status: Home / Self Care | Payer: Medicare Other | Source: Ambulatory Visit | Attending: Cardiovascular Disease | Admitting: Cardiovascular Disease

## 2020-12-29 DIAGNOSIS — I6529 Occlusion and stenosis of unspecified carotid artery: Secondary | ICD-10-CM | POA: Insufficient documentation

## 2021-01-06 ENCOUNTER — Other Ambulatory Visit: Payer: Self-pay

## 2021-01-06 ENCOUNTER — Ambulatory Visit (INDEPENDENT_AMBULATORY_CARE_PROVIDER_SITE_OTHER): Payer: Medicare Other | Admitting: Podiatry

## 2021-01-06 DIAGNOSIS — L03032 Cellulitis of left toe: Secondary | ICD-10-CM

## 2021-01-06 MED ORDER — DOXYCYCLINE HYCLATE 100 MG PO TABS: 100.0000 mg | ORAL_TABLET | Freq: Two times a day (BID) | ORAL | 0 refills | Status: DC

## 2021-01-06 NOTE — Progress Notes (Signed)
She presents today for follow-up of her paronychia.  States it is still oozing and painful.  Objective: Vital signs are stable alert oriented x3.  There is mild erythema some serosanguineous drainage no odor.  Mild tenderness on palpation status post matrixectomy tibia-fibula border of the left hallux.  Assessment paronychia status post matrixectomy.  Plan: Discontinue the use of the Silvadene cream.  Continue to soak every other day Epson salts and warm water.  Apply small amount of Neosporin.  I started her on doxycycline and I will follow-up with her in 2 weeks

## 2021-01-07 ENCOUNTER — Other Ambulatory Visit: Payer: Self-pay | Admitting: Family Medicine

## 2021-01-07 ENCOUNTER — Encounter: Payer: Self-pay | Admitting: Family Medicine

## 2021-01-07 DIAGNOSIS — R221 Localized swelling, mass and lump, neck: Secondary | ICD-10-CM | POA: Insufficient documentation

## 2021-01-07 DIAGNOSIS — D181 Lymphangioma, any site: Secondary | ICD-10-CM

## 2021-01-07 HISTORY — DX: Lymphangioma, any site: D18.1

## 2021-01-08 ENCOUNTER — Telehealth: Payer: Self-pay

## 2021-01-08 ENCOUNTER — Other Ambulatory Visit: Payer: Self-pay

## 2021-01-08 ENCOUNTER — Other Ambulatory Visit (INDEPENDENT_AMBULATORY_CARE_PROVIDER_SITE_OTHER): Payer: Medicare Other

## 2021-01-08 DIAGNOSIS — R221 Localized swelling, mass and lump, neck: Secondary | ICD-10-CM | POA: Diagnosis not present

## 2021-01-08 LAB — BASIC METABOLIC PANEL
BUN: 21 mg/dL (ref 6–23)
CO2: 32 mEq/L (ref 19–32)
Calcium: 9.4 mg/dL (ref 8.4–10.5)
Chloride: 102 mEq/L (ref 96–112)
Creatinine, Ser: 0.81 mg/dL (ref 0.40–1.20)
GFR: 70.95 mL/min (ref >60.00)
Glucose, Bld: 92 mg/dL (ref 70–99)
Potassium: 3.8 mEq/L (ref 3.5–5.1)
Sodium: 142 mEq/L (ref 135–145)

## 2021-01-08 LAB — TSH: TSH: 2.08 u[IU]/mL (ref 0.35–4.50)

## 2021-01-08 NOTE — Telephone Encounter (Signed)
Agree with reduced coumadin dosing as outlined below while on doxycycline course.

## 2021-01-08 NOTE — Telephone Encounter (Signed)
Advised pt to take 2.5mg  daily except take 5mg  on Wed and recheck INR on 4/7. Pt started abx today and will be continuing for 10 days, so the last day of abx will be 4/10.  Advised if any abnormal bruising or bleeding or anything else abnormal please contact the office or call 911. Gave ER precautions. Pt verbalized understanding.

## 2021-01-08 NOTE — Telephone Encounter (Signed)
Pt left VM that her podiatrist prescribed doxycycline, 100mg , BID, #20, on 3/30 and she was advised by pharmacy that this could effect warfarin.   Pt has taken doxycycline in the past. Past dosing for coumadin for same dosing of doxycycline for #14 was on 03/04/20 and pt normal coumadin dosing at that time was; 5mg  daily except take 7.5mg  on Mondays.  Pt was advised at that time to take 2.5mg  for 5 days and then reduce Monday dose to 5mg  and recheck on Tues. Pt was in range at recheck and told to restart normal dosing.    Currently pts is to take doxycycline for 10 days and her normal coumadin dosing: 5mg  daily except take 7.5mg  on Wednesdays. Recommended adjusted dosing for coumadin; Take 2.5mg  daily except take 5mg  on Wed and recheck INR on Thurs, 01/14/21. At recheck time, pt will have taken 7 or 8 days of abx.  Could also reduce dose until the following Mon, 4/11, and recheck INR on that day since pt is taking 10 course of abx this time. Routing to provider for recommendation or authorization

## 2021-01-11 ENCOUNTER — Encounter: Payer: Self-pay | Admitting: Family Medicine

## 2021-01-12 ENCOUNTER — Ambulatory Visit: Payer: Medicare Other | Admitting: Pulmonary Disease

## 2021-01-12 ENCOUNTER — Ambulatory Visit: Payer: Medicare Other | Admitting: Primary Care

## 2021-01-14 ENCOUNTER — Other Ambulatory Visit: Payer: Self-pay

## 2021-01-14 ENCOUNTER — Ambulatory Visit (INDEPENDENT_AMBULATORY_CARE_PROVIDER_SITE_OTHER): Payer: Medicare Other

## 2021-01-14 DIAGNOSIS — Z7901 Long term (current) use of anticoagulants: Secondary | ICD-10-CM | POA: Diagnosis not present

## 2021-01-14 LAB — POCT INR: INR: 1.4 — AB (ref 2.0–3.0)

## 2021-01-14 NOTE — Patient Instructions (Addendum)
Pre visit review using our clinic review tool, if applicable. No additional management support is needed unless otherwise documented below in the visit note.  Increase dose to take 10 mg today and tomorrow and then continue weekly dose to take 5mg  daily except take 7.5mg  on Wednesdays. Recheck in 1 week.

## 2021-01-16 NOTE — Progress Notes (Signed)
Agree. Thanks

## 2021-01-19 ENCOUNTER — Ambulatory Visit (INDEPENDENT_AMBULATORY_CARE_PROVIDER_SITE_OTHER): Payer: Medicare Other | Admitting: Internal Medicine

## 2021-01-19 ENCOUNTER — Other Ambulatory Visit: Payer: Self-pay

## 2021-01-19 ENCOUNTER — Encounter: Payer: Self-pay | Admitting: Internal Medicine

## 2021-01-19 VITALS — BP 118/72 | HR 74 | Temp 97.6°F | Ht 64.0 in | Wt 141.8 lb

## 2021-01-19 DIAGNOSIS — Z7185 Encounter for immunization safety counseling: Secondary | ICD-10-CM

## 2021-01-19 DIAGNOSIS — J449 Chronic obstructive pulmonary disease, unspecified: Secondary | ICD-10-CM

## 2021-01-19 DIAGNOSIS — R918 Other nonspecific abnormal finding of lung field: Secondary | ICD-10-CM

## 2021-01-19 MED ORDER — TRELEGY ELLIPTA 200-62.5-25 MCG/INH IN AEPB: 1.0000 | INHALATION_SPRAY | Freq: Every day | RESPIRATORY_TRACT | 0 refills | Status: DC

## 2021-01-19 NOTE — Progress Notes (Signed)
PCP Ria Bush, MD  HPI    OV 08/02/2017  Chief Complaint  Patient presents with  . Follow-up    Pt states that she got bronchitis x1 month ago. Has been on two rounds of abx and two rounds of steroids. States that it is not in her chest anymore but is still having drainage, sore throat, dry cough, occ. SOB. Denies any CP.    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 She comes for routine follow-up. Recently she had COPD flareup was treated by primary care. Then I called in antibiotic and repeat prednisone and this again helped her. Nevertheless she is frustrated by postnasal drainage and the resultant cough. She says normally for COPD exacerbations are associated with initial onset of sinus congestion. Then she's always left with a residual postnasal drip but this time it has lingered longer. She says Sudafed helps her but is no longer available over-the-counter. She really wants relief about this critically. COPD cat score is 14. Is also asking me to sign handicap parking sticker   OV 10/31/2017  Chief Complaint  Patient presents with  . Follow-up    In hosp. 11/13-11/15 due to sepsis from UTI. Pt states she is doing good but can't seem to get her energy back. Pt currently has laryngitis and SOB with exertion.    Follow-up Gold stage II/III COPD not on home oxygen but on triple inhaler therapy.  Is a routine follow-up.  Since I last saw her in the middle of November 2018 she got diagnosed with E. coli sepsis and admitted to the hospital.  Since then she has significant amount of fatigue.  The fatigue is not improved much.  She has suspended her maintenance pulmonary rehabilitation but plans to restart it in March 2019.  She has not had any COPD exacerbations.  She is up-to-date with the flu shot.  Overall COPD CAT score is stable but there is an increase in the level of fatigue.  The only new issue that she complains about is some mild  hoarseness in voice going on for several months.  This is present early in the morning when she wakes up and then as the day goes on it is better.  She notices it when she talks a lot of time in the phone after a long period of rest.  It is insidious onset.  It is not progressive.  There are no other associated symptoms.   OV 02/05/2018  Chief Complaint  Patient presents with  . Follow-up    Pt states she saw PCP two weeks ago and had pna. Pt states she is still not over the pna and has no energy, has hoarseness in voice, cough, and has chest tightness.     Follow-up gold stage II/III COPD not on home oxygen but on triple inhaler therapy with Singulair  Routine follow-up.  She tells me t of labs from January 22, 2018 hat January 22, 2018 she started feeling chest tightness wheezing and also increase head cold.  She saw her primary care physician Dr. Danise Mina.  Apparently all the blood work was normal except for mild increase in liver enzymes which she says she fluctuates with.  He was initially thinking a sinus congestion but when he did an x-ray of the report came back as a right lower lobe pneumonia [I personally visualized this x-ray and I agree she might not have right lower lobe pneumonia because in 2015 CT  chest she had some chronic scarring at that area].  She was treated with 5-day Levaquin and a Medrol Dosepak but despite this other than subjective fevers are resolving she still has significant cough congestion and chest tightness without any orthopnea proximal nocturnal dyspnea.  COPD CAT score shows significant deterioration.  She is not feeling well.  Of note, she is intentionally losing weight because of weight watchers but she has not increased her fluid intake adequately.  She has quit all diet soda  Show slight increase in liver enzymes and also slight increase in creatinine compared to 6 months ago.   OV 03/13/2018  Chief Complaint  Patient presents with  . Follow-up    Breathing  is improved since last OV. Still reports DOE and occasional cough. Denies chest tightness or wheezing.    [name]  presents for follow-up of her gold stage II /3 of COPD.  On triple inhaler therapy with Spiriva and Brio and also on Singulair   Overall in terms of her COPD: She is stable.  She is compliant with her inhalers.  She is not on oxygen therapy.  Her symptoms actually improved.  In the interim she was diagnosed with atrial fibrillation and she is placed on Xarelto.  She is frustrated because she has to be on 1 more extra medication.  She did have pulmonary function test that is documented below because it has been a while since we did one.  It actually shows improvement in the lung function.  She also had a CT scan of the chest because the last one was in 2015.  This shows scattered lung nodules with the largest one 8 mm in the right lower lobe.  The radiologist has described this is nonspecific in appearance.  She heard the result and she has been extremely anxious and upset.  She is worried that her colon cancer has metastasized or she has lung cancer.  Of note, I personally visualized the CT chest and agree with the findings   OV 06/27/2018  Subjective:  Patient ID: [name], female , DOB: [birth year] , age 75 y.o. , MRN: [id number] , ADDRESS: [address] [place] [address]   06/27/2018 -   Chief Complaint  Patient presents with  . Follow-up    Pt states her breathing has been doing good since last visit. States she has been having some BP issues to the point she fainted and cracked some ribs.  Denies any problems with cough or CP.     HPI [name] 75 y.o. - follow-up for  - COPD: Overall this is stable with inhaler therapy. She has not had her flu shot. She wants to wait 2 weeks because she just had first shot of her shingles vaccine. She is up-to-date with her pneumonia vaccine. In the interim she has lost weight 60 pounds intentionally and this is  improved on Score  Multiple  Lung nodules: She had follow-up CT chest in September 2019 and compared to May 2019: One of the nodules is resolved to 16 mm and stable. She does have some rib fractures subacute and this is because she fell down. She's been having balance issues with the weight loss and orthostatic hypotension. Both of this is being managed by neurosurgery and primary care  Travel advice: She plans to have back surgery for her foot drop after this she plans to recover at her son's home near [place]. Therefore she wants a antibiotic and prednisone taper to  be handy with her in case of a future COPD exacerbation.    Results for [name], [name] (MRN [id number]) as of 03/13/2018 11:58  Ref. Range 11/29/2013 16:04 04/01/2016 10:10 03/13/2018 10:36  FEV1-Pre Latest Units: L 0.93 1.35 1.42  FEV1-%Pred-Pre Latest Units: % 34 55 59   Results for [name], [name] (MRN [id number]) as of 03/13/2018 11:58  Ref. Range 03/13/2018 10:36  DLCO cor Latest Units: ml/min/mmHg 22.70  DLCO cor % pred Latest Units: % 84     CT chest Sept 4, 2019 Lungs/Pleura: Pleural thickening posteriorly in the right hemithorax is stable. There is no significant pleural effusion. There is stable subpleural scarring posteriorly in the right lower lobe. There is central airway thickening with scattered tree-in-bud nodularity in both lungs, similar to the most recent prior study. Nodular scarring in both upper lobes is stable. The right lower lobe perifissural nodule present on the most recent study has resolved. There is a stable 6 x 5 mm right lower lobe nodule on image 119/3. No new or enlarging pulmonary nodules.  IMPRESSION: 1. Generally stable chronic lung disease with pleuroparenchymal scarring and scattered nodularity. Most of these nodules have a tree-in-bud appearance, likely post infectious or inflammatory. The dominant right lower lobe nodule seen on the most recent study has resolved. No new or  enlarging nodules. 2. No acute chest findings. Healing subacute right-sided rib fractures. 3. Extensive Aortic Atherosclerosis (ICD10-I70.0).   Electronically Signed   By: Richardean Sale [name]   On: 06/13/2018 14   ROS - per HPI  OV 01/22/2020  Subjective:  Patient ID: [name], female , DOB: January 06, 1946 , age 39 y.o. , MRN: [id number] , ADDRESS: [address] [place] [address]   01/22/2020 -   Chief Complaint  Patient presents with  . Follow-up    Pt states she has been doing good since last visit and denies any complaints.     HPI Lissandra Christia Domke 75 y.o. -presents for follow-up.  Last seen in 2019.  After that because of the pandemic have not seen her.  She saw the nurse practitioner once.  The big interval changes that she has lost a lot of weight.  It is intentional because she is try to keep her self it.  From a functional standpoint COPD symptoms are much improved after weight loss.  She takes Spiriva Singulair and Breo.  However she is having significant back pain.  Other than that she feels fine.  She not able to do pulmonary rehab because of the back pain.  She has had a Covid vaccine  Other issues lung nodules.  Her last CT scan of the chest was in July 2020.  She has waxing and waning.  She is willing to have another CT scan of the chest at the appropriate time interval.      CAT COPD Symptom & Quality of Life Score ([place]) 0 is no burden. 5 is highest burden 08/02/2017  10/31/2017  02/05/2018  06/27/2018 60+ # intentioal weight loss  Never Cough -> Cough all the time 1 2 4 1   No phlegm in chest -> Chest is full of phlegm 0 0 3 0  No chest tightness -> Chest feels very tight 1 0 3 0  No dyspnea for 1 flight stairs/hill -> Very dyspneic for 1 flight of stairs 5 4 5 4   No limitations for ADL at home -> Very limited with ADL at home 1 0 5 3  Confident  leaving home -> Not at all confident leaving home 0 0 3 1  Sleep soundly -> Do  not sleep soundly because of lung condition 4 4 3 3   Lots of Energy -> No energy at all 2 3 5 4   TOTAL Score (max 40)  14 13 31 16    CAT Score 01/22/2020  Total CAT Score 9      ROS - per HPI    OV 07/14/2020  Subjective:  Patient ID: [name], female , DOB: 10/31/1945 , age 50 y.o. , MRN: [id number] , ADDRESS: [address] [place] [address]  PCP Ria Bush, MD    07/14/2020 -   Chief Complaint  Patient presents with  . Follow-up    no complaints    Advanced COPD on Spiriva and Breo and Singulair Multiple lung nodules with waxing and waning quality -last CT scan July 2020  HPI Nyomi Mozetta Murfin 75 y.o. -presents for follow-up of her COPD.  Last seen in early part of this year.  She is supposed to have a follow-up CT scan of the chest but this has not happened.  Patient tells me that she wants to hold off on getting the CT scan of the chest right now but will do it at the time of follow-up.  Last CT scan of the chest was in July 2020.  At this point in time she continues on Spiriva, Breo and Singulair.  She feels well.  She has been social distancing.  She has had her Covid vaccine but will have a booster next week.  She will have a flu shot today but at the Coumadin clinic.  There are no active complaints.  She is interested in consolidating her Spiriva and Breo and to Trelegy.  She denies any recent exacerbations.   CAT Score 07/14/2020 01/22/2020  Total CAT Score 6 9     No flowsheet data found.   No flowsheet data found.    PFT Results Latest Ref Rng & Units 03/13/2018 04/01/2016 11/29/2013  FVC-Pre L 2.21 2.22 1.49  FVC-Predicted Pre % 70 68 42  FVC-Post L - 2.33 1.60  FVC-Predicted Post % - 72 45  Pre FEV1/FVC % % 64 61 63  Post FEV1/FCV % % - 63 65  FEV1-Pre L 1.42 1.35 0.93  FEV1-Predicted Pre % 59 55 34  FEV1-Post L - 1.47 1.05  DLCO uncorrected ml/min/mmHg 24.55 19.21 -  DLCO UNC% % 91 71 -  DLCO corrected ml/min/mmHg 22.70 -  -  DLCO COR %Predicted % 84 - -  DLVA Predicted % 102 95 -  TLC L - 5.00 -  TLC % Predicted % - 93 -  RV % Predicted % - 121 -      ROS - per HPI   OV 01/19/2021  Subjective:  Patient ID: [name], female , DOB: December 13, 1945 , age 31 y.o. , MRN: [id number] , ADDRESS: [address] [place] [address] PCP Ria Bush, MD Patient Care Team: Ria Bush, MD as PCP - General (Family Medicine) Sanda Klein, MD as PCP - Cardiology (Cardiology) [name], MD as Consulting Physician (Gastroenterology) Kyung Rudd, MD as Consulting Physician (Radiation Oncology) Odogwu, Genevie Cheshire, MD as Consulting Physician (Hematology and Oncology) Terance Ice, MD (Inactive) as Consulting Physician (Cardiology) Brand Males, MD as Consulting Physician (Pulmonary Disease) Stark Klein, MD (General Surgery) Sanda Klein, MD as Consulting Physician (Cardiology) Shon Hough, MD as Consulting Physician (Ophthalmology) Martinique, Amy, MD as Consulting  Physician (Dermatology)  This Provider for this visit: Treatment Team:  Attending Provider: Lauraine Rinne, NP    01/19/2021 -   Chief Complaint  Patient presents with  . Follow-up    No current respiratory concerns. Has questions about 4th COVID booster.     Advanced COPD on Spiriva and Breo and Singulair Multiple lung nodules with waxing and waning quality -last CT scan July 2020 HPI [name] 75 y.o. - COPD and nodule followup. Feels well. Trelegy works better. No new issues. WEight stable. Was supposed to have followup CT for nodules from July 2020 but did not. Has upcoming neck CT. She is ok if we pair CT chest with this schedule. She wants to know if she should take 4th covid booster. Her bias is that 3 is good enough +_ she is masking and being careful and would rather wait for higher prevalence of disease or vaccine to new strain    CAT Score 07/14/2020 01/22/2020  Total CAT  Score 6 9       PFT  PFT Results Latest Ref Rng & Units 03/13/2018 04/01/2016 11/29/2013  FVC-Pre L 2.21 2.22 1.49  FVC-Predicted Pre % 70 68 42  FVC-Post L - 2.33 1.60  FVC-Predicted Post % - 72 45  Pre FEV1/FVC % % 64 61 63  Post FEV1/FCV % % - 63 65  FEV1-Pre L 1.42 1.35 0.93  FEV1-Predicted Pre % 59 55 34  FEV1-Post L - 1.47 1.05  DLCO uncorrected ml/min/mmHg 24.55 19.21 -  DLCO UNC% % 91 71 -  DLCO corrected ml/min/mmHg 22.70 - -  DLCO COR %Predicted % 84 - -  DLVA Predicted % 102 95 -  TLC L - 5.00 -  TLC % Predicted % - 93 -  RV % Predicted % - 121 -       has a past medical history of Acute cystitis without hematuria (12/22/2015), Anemia, Arthritis, CAP (community acquired pneumonia) (02/08/2016), CAP (community acquired pneumonia) (02/08/2016), Cataracts, bilateral, Chronic cholecystitis with calculus (01/21/2015), Chronic diastolic heart failure ([place]) (09/19/2014), Chronic insomnia, Constipation, COPD (chronic obstructive pulmonary disease) ([place]), DDD (degenerative disc disease), Dyspnea, Essential hypertension, GERD (gastroesophageal reflux disease), History of pneumonia (02/16/2016), History of radiation therapy (05/30/11 to 07/07/11), History of shingles, Hyperlipemia, Legally blind in left eye, as defined in [place], Lung nodule, Obesity, Osteopenia (12/2014), Pancreatitis (11/2014), PONV (postoperative nausea and vomiting), Presence of permanent cardiac pacemaker, Rectal cancer ([place]) (04/2011), Research study patient (08/26/2016), Rheumatic heart disease mitral stenosis, S/P AVR (aortic valve replacement) (2015), S/P MVR (mitral valve replacement) (2015), Seizures ([place]), Sepsis secondary to UTI ([place]) (08/22/2017), Severe aortic valve stenosis  AVR 3/15 (10/01/2013), Sleep apnea, Small bowel obstruction, partial ([place]) (08/2013), and Vitamin B12 deficiency (02/28/2015).   reports that she quit smoking about 46 years ago. Her smoking use included cigarettes. She has a 15.00 pack-year smoking  history. She has never used smokeless tobacco.  Past Surgical History:  Procedure Laterality Date  . ANTERIOR LAT LUMBAR FUSION Left 07/17/2018   Procedure: Lumbar Two-Three Lumbar Three-Four Anterolateral decompression/interbody fusion with lateral plate fixation/Infuse;  Surgeon: Kristeen Miss, MD;  Location: [facility];  Service: Neurosurgery;  Laterality: Left;  Lumbar Two-Three Lumbar Three-Four Anterolateral decompression/interbody fusion with lateral plate fixation/Infuse  . AORTIC VALVE REPLACEMENT N/A 12/12/2013   Procedure: AORTIC VALVE REPLACEMENT (AVR);  Surgeon: Gaye Pollack, MD; Service: Open Heart Surgery  . BACK SURGERY  2006, 2007   2006 SPACER, 2007 decompression and fusion L4/5  . BOWEL RESECTION  04/02/2012   Procedure: SMALL BOWEL RESECTION;  Surgeon: Stark Klein, MD;  Location: [facility];  Service: General;  Laterality: N/A;  . CARPAL TUNNEL RELEASE Bilateral   . CATARACT EXTRACTION W/ INTRAOCULAR LENS IMPLANT Left 10/18/2016  . CATARACT EXTRACTION W/ INTRAOCULAR LENS IMPLANT Right 12/13/2016   Dr. Kathrin Penner  . CHOLECYSTECTOMY N/A 01/21/2015   chronic cholecystitis, Stark Klein, MD  . COLON RESECTION  08/25/2011   Procedure: COLON RESECTION LAPAROSCOPIC;  Surgeon: Stark Klein, MD;  Location: [facility];  Service: General;  Laterality: N/A;  Laparoscopic Assisted Low Anterior Resection Diverting Ostomy and onQ pain pump  . COLONOSCOPY  03/2013   1 polyp, rpt 3 yrs Ardis Hughs)  . COLONOSCOPY WITH PROPOFOL N/A 06/09/2016   patent colo-colonic anastomosis, rpt 5 yrs Ardis Hughs)  . EP IMPLANTABLE DEVICE N/A 06/16/2016   Procedure: Pacemaker Implant;  Surgeon: Will Meredith Leeds, MD;  Location: [facility];  Service: Cardiovascular;  Laterality: N/A;  . FOOT SURGERY  left foot   hammer toe  . HAND SURGERY  Bil   carpal tunnel  . ILEOSTOMY  08/25/2011  . ILEOSTOMY CLOSURE  04/02/2012   Procedure: ILEOSTOMY TAKEDOWN;  Surgeon: Stark Klein, MD;  Location: [facility];  Service: General;   Laterality: N/A;  . INTRAOPERATIVE TRANSESOPHAGEAL ECHOCARDIOGRAM N/A 12/12/2013   Procedure: INTRAOPERATIVE TRANSESOPHAGEAL ECHOCARDIOGRAM;  Surgeon: Gaye Pollack, MD;  Location: [facility];  Service: Open Heart Surgery;  Laterality: N/A;  . LEFT AND RIGHT HEART CATHETERIZATION WITH CORONARY ANGIOGRAM N/A 11/27/2013   Procedure: LEFT AND RIGHT HEART CATHETERIZATION WITH CORONARY ANGIOGRAM;  Surgeon: Sanda Klein, MD;  Location: [facility];  Service: Cardiovascular;  Laterality: N/A;  . MITRAL VALVE REPLACEMENT N/A 12/12/2013   Procedure: MITRAL VALVE (MV) REPLACEMENT OR REPAIR;  Surgeon: Gaye Pollack, MD; Service: Open Heart Surgery  . TEE WITHOUT CARDIOVERSION N/A 11/29/2013   Procedure: TRANSESOPHAGEAL ECHOCARDIOGRAM (TEE);  Surgeon: Sueanne Margarita, MD;  [facility];  Service: Cardiovascular;  Laterality: N/A;  . TEE WITHOUT CARDIOVERSION N/A 06/04/2019   Procedure: TRANSESOPHAGEAL ECHOCARDIOGRAM (TEE);  Surgeon: Sanda Klein, MD;  Location: [facility] ENDOSCOPY;  Service: Cardiovascular;  Laterality: N/A;  . TONSILLECTOMY      Allergies  Allergen Reactions  . Codeine Nausea Only  . Sulfa Antibiotics Nausea Only  . Sulfonamide Derivatives Nausea Only    Immunization History  Administered Date(s) Administered  . Fluad Quad(high Dose 65+) 07/11/2019, 07/14/2020  . H1N1 09/17/2008  . Influenza Split 07/18/2012  . Influenza Whole 08/10/2005, 06/24/2009, 07/11/2011  . Influenza, High Dose Seasonal PF 07/04/2016, 07/04/2017  . Influenza,inj,Quad PF,6+ Mos 07/09/2018  . Influenza-Unspecified 07/24/2013, 06/30/2014, 06/30/2015  . Moderna SARS-COV2 Booster Vaccination 08/24/2020  . PFIZER(Purple Top)SARS-COV-2 Vaccination 10/30/2019, 11/20/2019  . Pneumococcal Conjugate-13 06/02/2014  . Pneumococcal Polysaccharide-23 08/10/2005, 04/21/2008, 06/25/2013  . Td 10/11/2005  . Zoster 09/16/2014  . Zoster Recombinat (Shingrix) 06/25/2018, 08/28/2018    Family History  Problem Relation Age  of Onset  . Lung cancer Father   . Heart disease Mother   . Diabetes Mother   . Hypertension Mother   . Stroke Mother   . CAD Son 7  . Heart attack Son 81  . Heart attack Maternal Grandfather   . Breast cancer Maternal Aunt 83  . Colon cancer Neg Hx   . Esophageal cancer Neg Hx   . Stomach cancer Neg Hx   . Rectal cancer Neg Hx      Current Outpatient Medications:  .  acetaminophen (TYLENOL) 500 MG tablet,  Take 2 tablets (1,000 mg total) by mouth daily., Disp: , Rfl:  .  albuterol (VENTOLIN HFA) 108 (90 Base) MCG/ACT inhaler, Inhale 2 puffs into the lungs every 6 (six) hours as needed for wheezing., Disp: 18 g, Rfl: 6 .  amoxicillin (AMOXIL) 500 MG capsule, TAKE 4 CAPSULES BY MOUTH 30-60 MINUTES PRIOR TO APPOINTMENT, Disp: 4 capsule, Rfl: 12 .  Biotin 1000 MCG tablet, Take 1,000 mcg by mouth daily., Disp: , Rfl:  .  Calcium Carb-Cholecalciferol 600-800 MG-UNIT TABS, Take 1 tablet by mouth every evening., Disp: , Rfl:  .  Cholecalciferol (VITAMIN D) 2000 UNITS CAPS, Take 2,000 Units by mouth every evening. , Disp: , Rfl:  .  docusate sodium (COLACE) 100 MG capsule, Take 200 mg by mouth every evening., Disp: , Rfl:  .  doxycycline (VIBRA-TABS) 100 MG tablet, Take 1 tablet (100 mg total) by mouth 2 (two) times daily., Disp: 20 tablet, Rfl: 0 .  Fluticasone-Umeclidin-Vilant (TRELEGY ELLIPTA) 200-62.5-25 MCG/INH AEPB, Inhale 1 puff into the lungs daily., Disp: 60 each, Rfl: 5 .  furosemide (LASIX) 40 MG tablet, TAKE 1 TABLET(40 MG) BY MOUTH DAILY, Disp: 30 tablet, Rfl: 11 .  gabapentin (NEURONTIN) 100 MG capsule, Take 300 mg by mouth at bedtime., Disp: , Rfl:  .  GEMTESA 75 MG TABS, Take 1 tablet by mouth daily., Disp: , Rfl:  .  ipratropium-albuterol (DUONEB) 0.5-2.5 (3) MG/3ML SOLN, Take 3 mLs by nebulization every 6 (six) hours as needed. (Patient taking differently: Take 3 mLs by nebulization every 6 (six) hours as needed (asthma).), Disp: 360 mL, Rfl: 0 .  lovastatin (MEVACOR) 40 MG  tablet, TAKE 1 TABLET(40 MG) BY MOUTH AT BEDTIME, Disp: 90 tablet, Rfl: 3 .  montelukast (SINGULAIR) 10 MG tablet, TAKE 1 TABLET(10 MG) BY MOUTH AT BEDTIME, Disp: 30 tablet, Rfl: 3 .  Multiple Vitamins-Minerals (PRESERVISION AREDS 2 PO), Take 2 capsules by mouth every evening. , Disp: , Rfl:  .  NEOMYCIN-POLYMYXIN-HYDROCORTISONE (CORTISPORIN) 1 % SOLN OTIC solution, Apply 1-2 drops to toe BID after soaking, Disp: 10 mL, Rfl: 1 .  nitrofurantoin (MACRODANTIN) 100 MG capsule, Take 100 mg by mouth daily., Disp: , Rfl:  .  polycarbophil (FIBERCON) 625 MG tablet, Take 625 mg by mouth every evening. , Disp: , Rfl:  .  polyethylene glycol (MIRALAX / GLYCOLAX) packet, Take 17 g by mouth daily., Disp: 14 each, Rfl: 0 .  potassium chloride (KLOR-CON) 10 MEQ tablet, Take 2 tablets (20 mEq total) by mouth daily., Disp: 60 tablet, Rfl: 9 .  silver sulfADIAZINE (SILVADENE) 1 % cream, Apply 1 application topically daily., Disp: 50 g, Rfl: 1 .  Talc (ZEASORB EX), Apply 1 application topically daily as needed (yeast infection under stomach). Under skin folds (abdomen), Disp: , Rfl:  .  temazepam (RESTORIL) 7.5 MG capsule, Take 1 capsule (7.5 mg total) by mouth at bedtime as needed for sleep., Disp: 30 capsule, Rfl: 0 .  traMADol (ULTRAM) 50 MG tablet, TAKE 1 TABLET(50 MG) BY MOUTH TWICE DAILY AS NEEDED FOR MODERATE PAIN, Disp: 20 tablet, Rfl: 0 .  vitamin B-12 (CYANOCOBALAMIN) 500 MCG tablet, Take 500 mcg by mouth every other day. , Disp: , Rfl:  .  warfarin (COUMADIN) 5 MG tablet, TAKE 1 TO 2 TABLETS BY MOUTH DAILY AS DIRECTED BY COUMADIN CLINIC, Disp: 60 tablet, Rfl: 5 .  Zinc 50 MG CAPS, Take 1 capsule (50 mg total) by mouth daily., Disp: , Rfl: 0 No current facility-administered medications for this visit.  Facility-Administered  Medications Ordered in Other Visits:  .  ondansetron (ZOFRAN) 4 mg in sodium chloride 0.9 % 50 mL IVPB, 4 mg, Intravenous, Q6H PRN, Kristeen Miss, MD      Objective:   Vitals:    01/19/21 0848  BP: 118/72  Pulse: 74  Temp: 97.6 F (36.4 C)  TempSrc: Temporal  SpO2: 96%  Weight: 141 lb 12.8 oz (64.3 kg)  Height: 5\' 4"  (1.626 m)    Estimated body mass index is 24.34 kg/m as calculated from the following:   Height as of this encounter: 5\' 4"  (1.626 m).   Weight as of this encounter: 141 lb 12.8 oz (64.3 kg).  @WEIGHTCHANGE @  [employer]   01/19/21 0848  Weight: 141 lb 12.8 oz (64.3 kg)     Physical Exam   General: No distress. Looks well Neuro: Alert and Oriented x 3. GCS 15. Speech normal Psych: Pleasant Resp:  Barrel Chest - no.  Wheeze - no, Crackles - no, No overt respiratory distress CVS: Normal heart sounds. Murmurs - no Ext: Stigmata of Connective Tissue Disease - n HEENT: Normal upper airway. PEERL +. No post nasal drip        Assessment:       ICD-10-CM   1. 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 Multiple lung nodules on CT  R91.8   3. Vaccine counseling  Z71.85        Plan:     Patient Instructions     ICD-10-CM   1. 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 Multiple lung nodules on CT  R91.8   3. Vaccine counseling  Z71.85       Multiple lung nodules on CT - per radiologist these nodules are new since 2015 -> may 2019 -> waxing and waning July 2020   PLan - please repeat CT chest without contrast any time now - next 6 months (CMA to see if we can pair it with upcoing neck CT)  Moderate COPD (chronic obstructive pulmonary disease) (HCC) - stable and better with trelegy  plan -Trelegy 200 daily  - take 1 sample - alb as needed  Vaccine Counseling  -4th shot covid at your discretion now - ok to wait for more data on strain and new vaccine etc.,   Followup - 6 months or sooner if needed; CAT score at followup     SIGNATURE    Dr. Brand Males, [name], F.C.C.P,  Pulmonary and Critical Care Medicine Staff Physician, [place] Director - Interstitial Lung Disease  Program  [facility] [place] [place], [place], [address]  Pager: [phone], If no answer or between  15:00h - 7:00h: call [phone] Telephone: [phone]  9:09 AM 01/19/2021

## 2021-01-19 NOTE — Patient Instructions (Addendum)
ICD-10-CM   1. 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 Multiple lung nodules on CT  R91.8   3. Vaccine counseling  Z71.85       Multiple lung nodules on CT - per radiologist these nodules are new since 2015 -> may 2019 -> waxing and waning July 2020   PLan - please repeat CT chest without contrast any time now - next 6 months (CMA to see if we can pair it with upcoing neck CT)  Moderate COPD (chronic obstructive pulmonary disease) (HCC) - stable and better with trelegy  plan -Trelegy 200 daily  - take 1 sample - alb as needed  Vaccine Counseling  -4th shot covid at your discretion now - ok to wait for more data on strain and new vaccine etc.,   Followup - 6 months or sooner if needed; CAT score at followup

## 2021-01-19 NOTE — Addendum Note (Signed)
Addended by: Lorretta Harp on: 01/19/2021 09:31 AM   Modules accepted: Orders

## 2021-01-21 ENCOUNTER — Ambulatory Visit (INDEPENDENT_AMBULATORY_CARE_PROVIDER_SITE_OTHER): Payer: Medicare Other

## 2021-01-21 ENCOUNTER — Other Ambulatory Visit: Payer: Self-pay

## 2021-01-21 ENCOUNTER — Ambulatory Visit: Payer: Medicare Other

## 2021-01-21 DIAGNOSIS — Z7901 Long term (current) use of anticoagulants: Secondary | ICD-10-CM

## 2021-01-21 LAB — POCT INR: INR: 3.6 — AB (ref 2.0–3.0)

## 2021-01-21 NOTE — Patient Instructions (Addendum)
Pre visit review using our clinic review tool, if applicable. No additional management support is needed unless otherwise documented below in the visit note.  HOLD dose today and then continue weekly dose to take 5mg  daily except take 7.5mg  on Wednesdays. Recheck in 2 weeks.

## 2021-01-22 NOTE — Progress Notes (Signed)
Agree. Thanks

## 2021-01-26 ENCOUNTER — Other Ambulatory Visit: Payer: Self-pay

## 2021-01-26 ENCOUNTER — Ambulatory Visit
Admission: RE | Admit: 2021-01-26 | Discharge: 2021-01-26 | Disposition: A | Discharge status: Home / Self Care | Payer: Medicare Other | Source: Ambulatory Visit | Attending: Family Medicine | Admitting: Family Medicine

## 2021-01-26 ENCOUNTER — Encounter: Payer: Self-pay | Admitting: Family Medicine

## 2021-01-26 DIAGNOSIS — R918 Other nonspecific abnormal finding of lung field: Secondary | ICD-10-CM | POA: Diagnosis present

## 2021-01-26 DIAGNOSIS — R221 Localized swelling, mass and lump, neck: Secondary | ICD-10-CM | POA: Diagnosis present

## 2021-01-26 MED ORDER — IOHEXOL 300 MG/ML  SOLN
75.0000 mL | Freq: Once | INTRAMUSCULAR | Status: AC | PRN
  Administered 2021-01-26: 75 mL via INTRAVENOUS

## 2021-01-26 NOTE — Telephone Encounter (Signed)
Please advise on patient mychart message  Good morning Dr. [name].  As you know, they are doing my lung CT scan today at 2:30 when they do the neck.    I am very anxious and would greatly appreciate a report from you as soon as possible.  Thank you so much.  [name]

## 2021-01-27 ENCOUNTER — Ambulatory Visit: Payer: Medicare Other | Admitting: Podiatry

## 2021-01-27 ENCOUNTER — Encounter: Payer: Self-pay | Admitting: Family Medicine

## 2021-01-27 NOTE — Telephone Encounter (Signed)
Replied via result note.

## 2021-01-27 NOTE — Telephone Encounter (Signed)
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 She would need a tele visit or offie visit - first avail next 4-8 weeks to discuss next steps    IMPRESSION: 1. 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 2. Largest new individual nodule measures 7 mm in the right middle lobe and is strongly favored to be infectious in etiology. Non-contrast chest CT at 3-6 months is recommended. If the nodules are stable at time of repeat CT, then future CT at 18-24 months (from today's scan) is considered optional for low-risk patients, but is recommended for high-risk patients. This recommendation follows the consensus statement: Guidelines for Management of Incidental Pulmonary Nodules Detected on CT Images: From the Fleischner Society 2017; Radiology 2017; 284:228-243. 3. Ascending aortic aneurysm, stable. Recommend annual imaging followup by CTA or MRA. This recommendation follows 2010 ACCF/AHA/AATS/ACR/ASA/SCA/SCAI/SIR/STS/SVM Guidelines for the Diagnosis and Management of Patients with Thoracic Aortic Disease. Circulation. 2010; 121: Q734-L937. Aortic aneurysm NOS (ICD10-I71.9). 4.  Aortic atherosclerosis (ICD10-I70.0). 5. Enlarged pulmonic trunk, indicative pulmonary arterial hypertension.   Electronically Signed   By: Lorin Picket [name]   On: 01/27/2021 08:41

## 2021-01-28 ENCOUNTER — Other Ambulatory Visit: Payer: Self-pay | Admitting: Family Medicine

## 2021-01-28 ENCOUNTER — Other Ambulatory Visit: Payer: Self-pay | Admitting: Internal Medicine

## 2021-01-28 NOTE — Telephone Encounter (Signed)
ERx 

## 2021-01-29 ENCOUNTER — Ambulatory Visit (INDEPENDENT_AMBULATORY_CARE_PROVIDER_SITE_OTHER): Payer: Medicare Other

## 2021-01-29 DIAGNOSIS — I441 Atrioventricular block, second degree: Secondary | ICD-10-CM

## 2021-01-30 LAB — CUP PACEART REMOTE DEVICE CHECK
Battery Remaining Longevity: 113 mo
Battery Remaining Percentage: 95.5 %
Battery Voltage: 2.98 V
Brady Statistic AP VP Percent: 77 %
Brady Statistic AP VS Percent: 1 %
Brady Statistic AS VP Percent: 22 %
Brady Statistic AS VS Percent: 1 %
Brady Statistic RA Percent Paced: 35 %
Brady Statistic RV Percent Paced: 99 %
Date Time Interrogation Session: 20220422071020
Implantable Lead Implant Date: 20170907
Implantable Lead Implant Date: 20170907
Implantable Lead Location: 753859
Implantable Lead Location: 753860
Implantable Pulse Generator Implant Date: 20170907
Lead Channel Impedance Value: 400 Ohm
Lead Channel Impedance Value: 540 Ohm
Lead Channel Pacing Threshold Amplitude: 0.5 V
Lead Channel Pacing Threshold Amplitude: 0.625 V
Lead Channel Pacing Threshold Pulse Width: 0.5 ms
Lead Channel Pacing Threshold Pulse Width: 0.5 ms
Lead Channel Sensing Intrinsic Amplitude: 3.9 mV
Lead Channel Sensing Intrinsic Amplitude: 6.9 mV
Lead Channel Setting Pacing Amplitude: 0.875
Lead Channel Setting Pacing Amplitude: 2 V
Lead Channel Setting Pacing Pulse Width: 0.5 ms
Lead Channel Setting Sensing Sensitivity: 2 mV
Pulse Gen Model: 2272
Pulse Gen Serial Number: 7945283

## 2021-02-03 ENCOUNTER — Other Ambulatory Visit: Payer: Self-pay

## 2021-02-03 ENCOUNTER — Ambulatory Visit (INDEPENDENT_AMBULATORY_CARE_PROVIDER_SITE_OTHER): Payer: Medicare Other | Admitting: Podiatry

## 2021-02-03 ENCOUNTER — Encounter: Payer: Self-pay | Admitting: Podiatry

## 2021-02-03 DIAGNOSIS — L03032 Cellulitis of left toe: Secondary | ICD-10-CM

## 2021-02-03 NOTE — Progress Notes (Signed)
She presents today for follow-up of her paronychia she states is doing much better she is very happy with the outcome thus far.  Objective: Vital signs are stable alert oriented x3 there is no erythema edema cellulitis drainage or odor.  Assessment: Well-healing matrixectomy's.  Plan: Continue to soak every other day Epson salts and warm water until is nontender on palpation.

## 2021-02-04 ENCOUNTER — Ambulatory Visit (INDEPENDENT_AMBULATORY_CARE_PROVIDER_SITE_OTHER): Payer: Medicare Other

## 2021-02-04 ENCOUNTER — Other Ambulatory Visit: Payer: Self-pay

## 2021-02-04 DIAGNOSIS — Z7901 Long term (current) use of anticoagulants: Secondary | ICD-10-CM

## 2021-02-04 LAB — POCT INR: INR: 4.3 — AB (ref 2.0–3.0)

## 2021-02-04 NOTE — Patient Instructions (Addendum)
Pre visit review using our clinic review tool, if applicable. No additional management support is needed unless otherwise documented below in the visit note.  HOLD dose today and tomorrow and then change weekly dose to take 5mg  daily except take 2.5 mg on Sundays and Wednesdays. Recheck in 2 weeks.

## 2021-02-07 NOTE — Progress Notes (Signed)
Agree. Thanks

## 2021-02-10 ENCOUNTER — Encounter: Payer: Self-pay | Admitting: Family Medicine

## 2021-02-15 ENCOUNTER — Other Ambulatory Visit: Payer: Self-pay | Admitting: Internal Medicine

## 2021-02-17 NOTE — Progress Notes (Signed)
Remote pacemaker transmission.   

## 2021-02-18 ENCOUNTER — Other Ambulatory Visit: Payer: Self-pay

## 2021-02-18 ENCOUNTER — Ambulatory Visit (INDEPENDENT_AMBULATORY_CARE_PROVIDER_SITE_OTHER): Payer: Medicare Other

## 2021-02-18 DIAGNOSIS — Z7901 Long term (current) use of anticoagulants: Secondary | ICD-10-CM

## 2021-02-18 LAB — POCT INR: INR: 1.5 — AB (ref 2.0–3.0)

## 2021-02-18 NOTE — Patient Instructions (Addendum)
Pre visit review using our clinic review tool, if applicable. No additional management support is needed unless otherwise documented below in the visit note.  Increase dose to take 10 mg (2 tablets) and then continue weekly dose to take 5mg  daily except take 2.5 mg on Sundays and Wednesdays. Recheck in 2 weeks.

## 2021-02-21 NOTE — Progress Notes (Signed)
Agree. Thanks

## 2021-02-26 ENCOUNTER — Other Ambulatory Visit: Payer: Self-pay | Admitting: Family Medicine

## 2021-02-26 NOTE — Telephone Encounter (Signed)
Name of Medication: Tramadol Name of Pharmacy: [facility] [place] or Written Date and Quantity: 01/28/21, #20 Last Office Visit and Type: 12/08/20, 6 mo f/u Next Office Visit and Type: 06/16/21, AWV prt 2 Last Controlled Substance Agreement Date: none Last UDS: none

## 2021-03-01 ENCOUNTER — Ambulatory Visit (INDEPENDENT_AMBULATORY_CARE_PROVIDER_SITE_OTHER): Payer: Medicare Other | Admitting: Cardiovascular Disease

## 2021-03-01 ENCOUNTER — Other Ambulatory Visit: Payer: Self-pay

## 2021-03-01 ENCOUNTER — Encounter: Payer: Self-pay | Admitting: Cardiovascular Disease

## 2021-03-01 VITALS — BP 118/62 | HR 77 | Ht 64.0 in | Wt 139.0 lb

## 2021-03-01 DIAGNOSIS — I712 Thoracic aortic aneurysm, without rupture: Secondary | ICD-10-CM

## 2021-03-01 DIAGNOSIS — Z953 Presence of xenogenic heart valve: Secondary | ICD-10-CM

## 2021-03-01 DIAGNOSIS — Z87898 Personal history of other specified conditions: Secondary | ICD-10-CM

## 2021-03-01 DIAGNOSIS — I441 Atrioventricular block, second degree: Secondary | ICD-10-CM

## 2021-03-01 DIAGNOSIS — J449 Chronic obstructive pulmonary disease, unspecified: Secondary | ICD-10-CM

## 2021-03-01 DIAGNOSIS — I4819 Other persistent atrial fibrillation: Secondary | ICD-10-CM

## 2021-03-01 DIAGNOSIS — A310 Pulmonary mycobacterial infection: Secondary | ICD-10-CM

## 2021-03-01 DIAGNOSIS — H53132 Sudden visual loss, left eye: Secondary | ICD-10-CM

## 2021-03-01 DIAGNOSIS — H539 Unspecified visual disturbance: Secondary | ICD-10-CM

## 2021-03-01 DIAGNOSIS — I7121 Aneurysm of the ascending aorta, without rupture: Secondary | ICD-10-CM

## 2021-03-01 DIAGNOSIS — R2689 Other abnormalities of gait and mobility: Secondary | ICD-10-CM | POA: Diagnosis not present

## 2021-03-01 DIAGNOSIS — I5032 Chronic diastolic (congestive) heart failure: Secondary | ICD-10-CM | POA: Diagnosis not present

## 2021-03-01 DIAGNOSIS — Z950 Presence of cardiac pacemaker: Secondary | ICD-10-CM

## 2021-03-01 DIAGNOSIS — T82857D Stenosis of cardiac prosthetic devices, implants and grafts, subsequent encounter: Secondary | ICD-10-CM | POA: Diagnosis not present

## 2021-03-01 DIAGNOSIS — Z7901 Long term (current) use of anticoagulants: Secondary | ICD-10-CM

## 2021-03-01 DIAGNOSIS — M4810 Ankylosing hyperostosis [Forestier], site unspecified: Secondary | ICD-10-CM

## 2021-03-01 NOTE — Patient Instructions (Signed)
Medication Instructions:  No changes *If you need a refill on your cardiac medications before your next appointment, please call your pharmacy*   Lab Work: None ordered If you have labs (blood work) drawn today and your tests are completely normal, you will receive your results only by: Marland Kitchen MyChart Message (if you have MyChart) OR . A paper copy in the mail If you have any lab test that is abnormal or we need to change your treatment, we will call you to review the results.   Testing/Procedures: None ordered   Follow-Up: At [facility], you and your health needs are our priority.  As part of our continuing mission to provide you with exceptional heart care, we have created designated Provider Care Teams.  These Care Teams include your primary Cardiologist (physician) and Advanced Practice Providers (APPs -  Physician Assistants and Nurse Practitioners) who all work together to provide you with the care you need, when you need it.  We recommend signing up for the patient portal called "MyChart".  Sign up information is provided on this After Visit Summary.  MyChart is used to connect with patients for Virtual Visits (Telemedicine).  Patients are able to view lab/test results, encounter notes, upcoming appointments, etc.  Non-urgent messages can be sent to your provider as well.   To learn more about what you can do with MyChart, go to [email or web address].    Your next appointment:   12 month(s)  The format for your next appointment:   In Person  Provider:   Sanda Klein, MD   Other Instructions A referral has been placed to Neurology, Dr. Jannifer Franklin. They will call to set the appointment up.

## 2021-03-01 NOTE — Telephone Encounter (Signed)
ERx 

## 2021-03-01 NOTE — Progress Notes (Signed)
Cardiology office note   Date:  03/02/2021   ID:  [name], [place] 06-23-1946, MRN [id number]  Patient Location: Home Provider Location: Home  PCP:  Ria Bush, MD  Cardiologist:  Sanda Klein, MD  Electrophysiologist:  None   Evaluation Performed:  Follow-Up Visit  Chief Complaint: bioprosthetic mitral valve stenosis  History of Present Illness:    [name] is a 75 y.o. 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, [place], 2017), (typical?) atrial flutter (overdrive pacing March 4098).  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 She has a remote history of transient amaurosis fugax during a period of subtherapeutic anticoagulation in the past.  Has had another episode of transient left eye partial visual field cut.  This always resolves within a few minutes.  In the past we will blame these events on issues with anticoagulant effect in the setting of a prosthetic valve and atrial fibrillation, but the repetitive nature of her vision complaint suggests a local problem.  She reports that she has been evaluated by an ophthalmologist but has found "nothing wrong".  She has other neurological complaints including increasing the difficulty with her balance.  This makes her very afraid that she may fall.  She is also recently had trouble finding words.  She worries that she is developing dementia.  She has not actually had any falls or serious bleeding problems.  Her INR has been rather variable and its been difficult to keep her in therapeutic range.  She is scheduled for colonoscopy in August.  She is not having problems with orthopnea and PND, but has always has intermittent  mild right ankle swelling, always asymmetrical.  She denies chest pain, palpitations and syncope.  She has chronic problems with back pain that did not improve after a spinal injection.  She has DISH.  Interrogation of her pacemaker shows that she has been mostly in atrial fibrillation for the last year, although interestingly she was in normal (atrial paced rhythm) and January and February 2022, recurrent atrial fibrillation throughout most of March, some period of normal rhythm in April and in atrial fibrillation again currently.  Overall has had roughly 65% atrial fibrillation and 35% atrial paced rhythm throughout the last year.  She is 100% ventricular paced and has a distinct positive R wave in lead V1.  Her pacemaker is a Architect implanted in 2017, both leads are [employer] 2088 so her system is MRI conditional.  Estimated generator longevity is 9-9.5 months.  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  She has a known mild ascending aortic aneurysm that has been stable in diameter at 41 mm and a mildly dilated pulmonary trunk.  She had a CT myelogram on May 08 2019.  There was severe left foraminal stenosis at L3-4 as well as moderate left foraminal stenosis at C4-5-6.  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   Past Medical History:  Diagnosis Date  . Acute cystitis without hematuria 12/22/2015  . Anemia   . Arthritis   . CAP (community acquired pneumonia) 02/08/2016  . CAP (community acquired pneumonia) 02/08/2016  . Cataracts, bilateral  immature  . Chronic cholecystitis with calculus 01/21/2015   S/p cholecystectomy   . Chronic diastolic heart failure ([place]) 09/19/2014  . Chronic insomnia   . Constipation    takes Miralax daily as needed  . COPD (chronic obstructive pulmonary disease) (HCC)    Albuterol inhaler daily as needed;Duoneb daily as needed;Spiriva daily   . DDD (degenerative disc disease)    cervical - kyphosis with mod DD changes C5/6 and C6/7; lumbar - early DD at L2/3 ([place])  . Dyspnea    with exertion  . Essential hypertension    was on meds but after appointment Dec 11 with Dr.C he took her off  . GERD (gastroesophageal reflux disease)    takes Protonix daily  . History of pneumonia 02/16/2016  . History of radiation therapy 05/30/11 to 07/07/11   rectum  . History of shingles   . Hyperlipemia    takes Lovastatin daily  . Legally blind in left eye, as defined in [place]   . Lung nodule    RLL nodule-61m stable 2006, April 2009, and June 2009  . Obesity   . Osteopenia 12/2014   T -1.5 hip  . Pancreatitis 11/2014   ?zpack related vs gallstone pancreatitis with abnormal HIDA scan pending cholecystectomy  . PONV (postoperative nausea and vomiting)   . Presence of permanent cardiac pacemaker   . Rectal cancer ([facility] 04/2011   T3N0; s/p lap LAR, s/p ileostomy, s/p reversal, s/p chemo  . Research study patient 08/26/2016  . Rheumatic heart disease mitral stenosis    mod MS by echo 02/2014  . S/P AVR (aortic valve replacement) 2015   bioprosthetic ([place])  . S/P MVR (mitral valve replacement) 2015   bioprosthetic ([place])  . Seizures ([facility]    febrile seizures as a child  . Sepsis secondary to UTI ([facility] 08/22/2017  . Severe aortic valve stenosis  AVR 3/15 10/01/2013   mild-mod by echo 02/2014  . Sleep apnea    PT TOLD BORDERLINE-TRIED CPAP-DID NOT HELP-SHE DOES NOT USE CPAP  . Small bowel obstruction, partial ([facility] 08/2013   reolved without NGT placement.   . Vitamin B12 deficiency 02/28/2015   Start B12 shots 02/2015    Past Surgical History:  Procedure Laterality Date  . ANTERIOR LAT LUMBAR FUSION Left 07/17/2018   Procedure: Lumbar Two-Three Lumbar Three-Four Anterolateral decompression/interbody fusion with lateral plate fixation/Infuse;  Surgeon: EKristeen Miss MD;  [place] [facility]  Service: Neurosurgery;  Laterality: Left;   Lumbar Two-Three Lumbar Three-Four Anterolateral decompression/interbody fusion with lateral plate fixation/Infuse  . AORTIC VALVE REPLACEMENT N/A 12/12/2013   Procedure: AORTIC VALVE REPLACEMENT (AVR);  Surgeon: BGaye Pollack MD; Service: Open Heart Surgery  . BACK SURGERY  2006, 2007   2006 SPACER, 2007 decompression and fusion L4/5  . BOWEL RESECTION  04/02/2012   Procedure: SMALL BOWEL RESECTION;  Surgeon: FStark Klein MD;  [facility]: [facility];  Service: General;  Laterality: N/A;  . CARPAL TUNNEL RELEASE Bilateral   . CATARACT EXTRACTION W/ INTRAOCULAR LENS IMPLANT Left 10/18/2016  . CATARACT EXTRACTION W/ INTRAOCULAR LENS IMPLANT Right 12/13/2016   Dr. SKathrin Penner . CHOLECYSTECTOMY N/A 01/21/2015   chronic cholecystitis, FStark Klein MD  . COLON RESECTION  08/25/2011   Procedure: COLON RESECTION LAPAROSCOPIC;  Surgeon: FStark Klein MD;  [facility];  Service: General;  Laterality: N/A;  Laparoscopic Assisted Low Anterior Resection Diverting Ostomy and onQ pain pump  . COLONOSCOPY  03/2013   1 polyp, rpt 3 yrs (Ardis Hughs  . COLONOSCOPY  WITH PROPOFOL N/A 06/09/2016   patent colo-colonic anastomosis, rpt 5 yrs Ardis Hughs)  . EP IMPLANTABLE DEVICE N/A 06/16/2016   Procedure: Pacemaker Implant;  Surgeon: Will Meredith Leeds, MD;  Location: [facility];  Service: Cardiovascular;  Laterality: N/A;  . FOOT SURGERY  left foot   hammer toe  . HAND SURGERY  Bil   carpal tunnel  . ILEOSTOMY  08/25/2011  . ILEOSTOMY CLOSURE  04/02/2012   Procedure: ILEOSTOMY TAKEDOWN;  Surgeon: Stark Klein, MD;  Location: [facility];  Service: General;  Laterality: N/A;  . INTRAOPERATIVE TRANSESOPHAGEAL ECHOCARDIOGRAM N/A 12/12/2013   Procedure: INTRAOPERATIVE TRANSESOPHAGEAL ECHOCARDIOGRAM;  Surgeon: Gaye Pollack, MD;  Location: [facility];  Service: Open Heart Surgery;  Laterality: N/A;  . LEFT AND RIGHT HEART CATHETERIZATION WITH CORONARY ANGIOGRAM N/A 11/27/2013   Procedure: LEFT AND RIGHT HEART CATHETERIZATION  WITH CORONARY ANGIOGRAM;  Surgeon: Sanda Klein, MD;  Location: [facility];  Service: Cardiovascular;  Laterality: N/A;  . MITRAL VALVE REPLACEMENT N/A 12/12/2013   Procedure: MITRAL VALVE (MV) REPLACEMENT OR REPAIR;  Surgeon: Gaye Pollack, MD; Service: Open Heart Surgery  . TEE WITHOUT CARDIOVERSION N/A 11/29/2013   Procedure: TRANSESOPHAGEAL ECHOCARDIOGRAM (TEE);  Surgeon: Sueanne Margarita, MD;  [facility];  Service: Cardiovascular;  Laterality: N/A;  . TEE WITHOUT CARDIOVERSION N/A 06/04/2019   Procedure: TRANSESOPHAGEAL ECHOCARDIOGRAM (TEE);  Surgeon: Sanda Klein, MD;  [facility];  Service: Cardiovascular;  Laterality: N/A;  . TONSILLECTOMY       Current Meds  Medication Sig  . acetaminophen (TYLENOL) 500 MG tablet Take 2 tablets (1,000 mg total) by mouth daily.  Marland Kitchen albuterol (VENTOLIN HFA) 108 (90 Base) MCG/ACT inhaler Inhale 2 puffs into the lungs every 6 (six) hours as needed for wheezing.  Marland Kitchen amoxicillin (AMOXIL) 500 MG capsule TAKE 4 CAPSULES BY MOUTH 30-60 MINUTES PRIOR TO APPOINTMENT  . Biotin 1000 MCG tablet Take 1,000 mcg by mouth daily.  . Calcium Carb-Cholecalciferol 600-800 MG-UNIT TABS Take 1 tablet by mouth every evening.  . Cholecalciferol (VITAMIN D) 2000 UNITS CAPS Take 2,000 Units by mouth every evening.   . docusate sodium (COLACE) 100 MG capsule Take 200 mg by mouth every evening.  . furosemide (LASIX) 40 MG tablet TAKE 1 TABLET(40 MG) BY MOUTH DAILY  . gabapentin (NEURONTIN) 100 MG capsule Take 300 mg by mouth at bedtime.  Marland Kitchen GEMTESA 75 MG TABS Take 1 tablet by mouth daily.  Marland Kitchen ipratropium-albuterol (DUONEB) 0.5-2.5 (3) MG/3ML SOLN Take 3 mLs by nebulization every 6 (six) hours as needed. (Patient taking differently: Take 3 mLs by nebulization every 6 (six) hours as needed (asthma).)  . lovastatin (MEVACOR) 40 MG tablet TAKE 1 TABLET(40 MG) BY MOUTH AT BEDTIME  . montelukast (SINGULAIR) 10 MG tablet TAKE 1 TABLET(10 MG) BY MOUTH AT BEDTIME  .  Multiple Vitamins-Minerals (PRESERVISION AREDS 2 PO) Take 2 capsules by mouth every evening.   Marland Kitchen NEOMYCIN-POLYMYXIN-HYDROCORTISONE (CORTISPORIN) 1 % SOLN OTIC solution Apply 1-2 drops to toe BID after soaking  . nitrofurantoin (MACRODANTIN) 100 MG capsule Take 100 mg by mouth daily.  . polycarbophil (FIBERCON) 625 MG tablet Take 625 mg by mouth every evening.   . polyethylene glycol (MIRALAX / GLYCOLAX) packet Take 17 g by mouth daily.  . potassium chloride (KLOR-CON) 10 MEQ tablet Take 2 tablets (20 mEq total) by mouth daily.  . silver sulfADIAZINE (SILVADENE) 1 % cream Apply 1 application topically daily.  . [place] (ZEASORB EX) Apply 1 application topically daily as needed (yeast  infection under stomach). Under skin folds (abdomen)  . temazepam (RESTORIL) 7.5 MG capsule Take 1 capsule (7.5 mg total) by mouth at bedtime as needed for sleep.  . traMADol (ULTRAM) 50 MG tablet TAKE 1 TABLET(50 MG) BY MOUTH TWICE DAILY AS NEEDED FOR MODERATE PAIN  . TRELEGY ELLIPTA 200-62.5-25 MCG/INH AEPB INHALE 1 PUFF INTO THE LUNGS DAILY  . vitamin B-12 (CYANOCOBALAMIN) 500 MCG tablet Take 500 mcg by mouth every other day.   . warfarin (COUMADIN) 5 MG tablet TAKE 1 TO 2 TABLETS BY MOUTH DAILY AS DIRECTED BY COUMADIN CLINIC  . Zinc 50 MG CAPS Take 1 capsule (50 mg total) by mouth daily.  . [DISCONTINUED] doxycycline (VIBRA-TABS) 100 MG tablet Take 1 tablet (100 mg total) by mouth 2 (two) times daily.     Allergies:   Codeine, Sulfa antibiotics, and Sulfonamide derivatives   Social History   Tobacco Use  . Smoking status: Former Smoker    Packs/day: 1.50    Years: 10.00    Pack years: 15.00    Types: Cigarettes    Quit date: 12/28/1974    Years since quitting: 46.2  . Smokeless tobacco: Never Used  . Tobacco comment: quit smoking in 1976  Vaping Use  . Vaping Use: Never used  Substance Use Topics  . Alcohol use: Not Currently    Alcohol/week: 2.0 standard drinks    Types: 2 Glasses of wine per week     Comment: very rare  . Drug use: No     Family Hx: The patient's family history includes Breast cancer (age of onset: 26) in her maternal aunt; CAD (age of onset: 63) in her son; Diabetes in her mother; Heart attack in her maternal grandfather; Heart attack (age of onset: 34) in her son; Heart disease in her mother; Hypertension in her mother; Lung cancer in her father; Stroke in her mother. There is no history of Colon cancer, Esophageal cancer, Stomach cancer, or Rectal cancer.  ROS:   Please see the history of present illness.     All other systems reviewed and are negative.   Prior CV studies:   The following studies were reviewed today:  Echocardiogram September 02, 2019  1. Left ventricular ejection fraction, by visual estimation, is 60 to 65%. The left ventricle has normal function. Left ventricular septal wall thickness was mildly increased. Mildly increased left ventricular posterior wall thickness. There is mildly  increased left ventricular hypertrophy.  2. Intraventricular gradient noted. Peak velocity 3.08 m/s. Peak gradient 38 mmHg.  3. Global right ventricle has normal systolic function.The right ventricular size is normal. No increase in right ventricular wall thickness.  4. Left atrial size was normal.  5. Right atrial size was normal.  6. The mitral valve has been repaired/replaced. No evidence of mitral valve regurgitation. Moderate mitral stenosis.  7. 25 mm Edwards MagnaEase bioprosthetic mitral valve present. Moderate mitral stenosis. Mean gradient 9 mmHg compared wtih 11 mmHg 05/2019. Heart rate 71 bpm.  8. The tricuspid valve is normal in structure. Tricuspid valve regurgitation is mild.  9. Aortic valve mean gradient measures 12.0 mmHg. 10. Aortic valve peak gradient measures 20.9 mmHg. 11. Aortic valve regurgitation is not visualized. Mild aortic valve stenosis. 12. Mean aortic valve gradient unchanged from 05/2019 (11 mmHg). 13. The pulmonic valve was normal in  structure. Pulmonic valve regurgitation is not visualized. 14. Mildly elevated pulmonary artery systolic pressure. 15. The inferior vena cava is normal in size with <50% respiratory variability, suggesting right atrial  pressure of 8 mmHg.  In comparison to the previous echocardiogram(s): 05/14/19 EF 55-60%. AV 46mHg mean, 189mg peak. MV 115m mean, 64m73mpeak.    Echocardiogram March 23, 2020 1. Left ventricular ejection fraction, by estimation, is 65 to 70%. The  left ventricle has normal function. The left ventricle has no regional  wall motion abnormalities. There is severe concentric left ventricular  hypertrophy. Left ventricular diastolic  parameters are consistent with Grade II diastolic dysfunction  (pseudonormalization). Elevated left ventricular end-diastolic pressure.  2. Right ventricular systolic function is normal. The right ventricular  size is normal. There is mildly elevated pulmonary artery systolic  pressure. The estimated right ventricular systolic pressure is 38.878.2g.  3. Left atrial size was moderately dilated.  4. Tricuspid valve regurgitation is mild to moderate.  5. The mitral valve has been repaired/replaced. There is a 25mm43moprosthetic Magna Ease MVR in the mitral position. The mean mitral valve  gradient is elevated at 9.0 mmHg. There is significant thickening of MV  leaflets. Moderate mitral stenosis is  present with mean MVF 9mmHg95m6. The aortic valve has been repaired/replaced. Aortic valve  regurgitation is not visualized. Mild aortic valve stenosis of  bioprosthetic AVR. Aortic valve mean gradient measures 14.0 mmHg. Aortic  valve Vmax measures 2.65 m/s.  7. Aortic dilatation noted. There is mild dilatation of the ascending  aorta measuring 40 mm.  8. The inferior vena cava is normal in size with greater than 50%  respiratory variability, suggesting right atrial pressure of 3 mmHg.  9. Compared to echo 08/2019, the AV bioprosthesis  is stable with a mean  AVG of 14mmHg73mmmHg 38mr exam). Modreate mitral stenosis of MV  bioprosthesis persists. There is no change in the mean MVG which is 9mmHg   51ms/Other Tests and Data Reviewed:    EKG: Electrocardiogram was checked today and shows coarse atrial fibrillation (possible atrial flutter) with 100% ventricular paced rhythm at this time positive R wave in lead V1.  Similar to previous tracings.   Recent Labs: 03/10/2020: Pro B Natriuretic peptide (BNP) 379.0 06/05/2020: ALT 20; Hemoglobin 14.3; Platelets 128.0 01/08/2021: BUN 21; Creatinine, Ser 0.81; Potassium 3.8; Sodium 142; TSH 2.08   Recent Lipid Panel Lab Results  Component Value Date/Time   CHOL 152 06/05/2020 08:09 AM   TRIG 65.0 06/05/2020 08:09 AM   HDL 61.70 06/05/2020 08:09 AM   CHOLHDL 2 06/05/2020 08:09 AM   LDLCALC 77 06/05/2020 08:09 AM   LDLDIRECT 111.0 06/26/2015 09:28 AM   CT chest from 04/22/2019 shows "waxing/waning peribronchovascular nodularity in the lungs bilaterally, suggesting a combination of postinfectious inflammatory scarring and atypical mycobacterial infection rather than metastatic disease".  There was evidence of three-vessel coronary and aortic atherosclerosis.  Tiny right pleural effusion, unchanged. On my review, highly suspicious that the tip of the right ventricular pacing lead is in the pericardial space.  This would explain the positive R waves in leads V1.   Wt Readings from Last 3 Encounters:  03/01/21 139 lb (63 kg)  01/19/21 141 lb 12.8 oz (64.3 kg)  12/08/20 140 lb (63.5 kg)     Objective:    Vital Signs:  BP 118/62   Pulse 77   Ht _0  (1.626 m)   Wt 139 lb (63 kg)   SpO2 96%   BMI 23.86 kg/m      General: Alert, oriented x3, no distress, lean.  Healthy pacemaker site. Head: no evidence of trauma, PERRL, EOMI, no exophtalmos or lid  lag, no myxedema, no xanthelasma; normal ears, nose and oropharynx Neck: normal jugular venous pulsations and no hepatojugular  reflux; brisk carotid pulses without delay and no carotid bruits Chest: clear to auscultation, no signs of consolidation by percussion or palpation, normal fremitus, symmetrical and full respiratory excursions Cardiovascular: normal position and quality of the apical impulse, regular rhythm, normal first and split second heart sounds, no murmurs, rubs or gallops Abdomen: no tenderness or distention, no masses by palpation, no abnormal pulsatility or arterial bruits, normal bowel sounds, no hepatosplenomegaly Extremities: no clubbing, cyanosis or edema; 2+ radial, ulnar and brachial pulses bilaterally; 2+ right femoral, posterior tibial and dorsalis pedis pulses; 2+ left femoral, posterior tibial and dorsalis pedis pulses; no subclavian or femoral bruits Neurological: grossly nonfocal Psych: Normal mood and affect    ASSESSMENT & PLAN:    1. Chronic diastolic heart failure (HCC)   2. Stenosis of prosthetic mitral valve, subsequent encounter   3. History of aortic valve replacement with bioprosthetic valve   4. Second degree AV block   5. Pacemaker   6. Persistent atrial fibrillation ([place])   7. Long term (current) use of anticoagulants   8. History of syncope   9. Chronic obstructive pulmonary disease, unspecified COPD type ([place])   10. Acute loss of vision, left   11. 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 Ascending aortic aneurysm ([place])   13. DISH (diffuse idiopathic skeletal hyperostosis)   14. Vision changes   15. Balance problem      1. CHF: This appears to be very well compensated and was mostly a transient phenomenon related to prostatic mitral valve dysfunction probably due to leaflet thrombosis.  Currently appears to be euvolemic on a relatively low-dose of loop diuretic and has good functional status.  She is limited more by her balance issues and fear of falling than by shortness of breath or fatigue. 2. Prosthetic MV stenosis: Transient, related to thrombotic  leaflet restriction.  Resolved after transitioning to warfarin anticoagulation.  Recheck echo in June 2021 showed that prosthetic valve gradients had returned to baseline and were stable. 3. S/p AVR: Aortic valve biological prosthesis is functioning normally with stable gradients on echo in June 2021.  She is aware of the need for endocarditis prophylaxis. 4. 2nd deg AV block: Unable to avoid ventricular pacing despite prolonging AV delays improving device timing.  She has 100% ventricular paced rhythm.  This does not seem to have a major impact on her functional status. 5. Pacemaker: Normal device function.  Followed by Dr. Curt Bears. 6. Atrial fibrillation: She declined cardioversion since the rhythm was asymptomatic.  Interestingly she spontaneously returned to atrial paced rhythm for several weeks this year, although she is currently in atrial fibrillation.  She does not appear to be aware when she switches from normal rhythm to atrial fibrillation.  On warfarin anticoagulation. 7. Anticoagulation/warfarin: No serious bleeding complications.  INR has been difficult to maintain therapeutic range.  Due to her increased risk of embolic events, will require "bridging" with enoxaparin for her colonoscopy in August. 8. History of syncope: History of syncope on higher doses of bisoprolol related to orthostatic hypotension.  Tolerating metoprolol well.   9. COPD: No recent episodes of exacerbation. 10. Recurrent vision loss: Her episodes of loss of vision are stereotypical, always involving the left eye; appears to be inferolateral quadrantopsia.  When she had her initial event, I was concerned that she had thromboembolism from her dysfunctional biological prosthesis and/or atrial fibrillation.  Since  the episodes are repetitive, this makes cardioembolic events virtually impossible.  It suggests a local problem.  She tells me that ophthalmological exams have been normal.  She also has problems with balance and has  newly discovered problems finding words.  She would be a very unusual age to develop multiple sclerosis.  Consider temporal arteritis, but she does not have headaches or temporal tenderness, systemic symptoms or a history of collagen vascular disease. Has mild macrocytosis, but normal B12 level 05/2020.  I am puzzled by her neurological complaints and have recommended evaluation by a specialist. 11. 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 Appointment w Dr. [name] 03/02/2021.  12. Ascending aortic aneurysm: Very mild at 4.1 cm and stable in size on serial imaging studies 13.  HLP: On the current medical regimen and with her commitment to weight loss, all her lipid parameters are excellent. 14.  DISH: spinal injection did not help.  Limits her activity.  COVID-19 Education: The signs and symptoms of COVID-19 were discussed with the patient and how to seek care for testing (follow up with PCP or arrange E-visit).  The importance of social distancing was discussed today.  Patient Instructions  Medication Instructions:  No changes *If you need a refill on your cardiac medications before your next appointment, please call your pharmacy*   Lab Work: None ordered If you have labs (blood work) drawn today and your tests are completely normal, you will receive your results only by: Marland Kitchen MyChart Message (if you have MyChart) OR . A paper copy in the mail If you have any lab test that is abnormal or we need to change your treatment, we will call you to review the results.   Testing/Procedures: None ordered   Follow-Up: At [facility], you and your health needs are our priority.  As part of our continuing mission to provide you with exceptional heart care, we have created designated Provider Care Teams.  These Care Teams include your primary Cardiologist (physician) and Advanced Practice Providers (APPs -  Physician Assistants and Nurse Practitioners) who  all work together to provide you with the care you need, when you need it.  We recommend signing up for the patient portal called "MyChart".  Sign up information is provided on this After Visit Summary.  MyChart is used to connect with patients for Virtual Visits (Telemedicine).  Patients are able to view lab/test results, encounter notes, upcoming appointments, etc.  Non-urgent messages can be sent to your provider as well.   To learn more about what you can do with MyChart, go to [email or web address].    Your next appointment:   12 month(s)  The format for your next appointment:   In Person  Provider:   Sanda Klein, MD   Other Instructions A referral has been placed to Neurology, Dr. Jannifer Franklin. They will call to set the appointment up.    Signed, Sanda Klein, MD  03/02/2021 6:00 PM    [place]

## 2021-03-02 DIAGNOSIS — I712 Thoracic aortic aneurysm, without rupture: Secondary | ICD-10-CM | POA: Insufficient documentation

## 2021-03-02 DIAGNOSIS — Z2239 Carrier of other specified bacterial diseases: Secondary | ICD-10-CM

## 2021-03-02 DIAGNOSIS — A310 Pulmonary mycobacterial infection: Secondary | ICD-10-CM | POA: Insufficient documentation

## 2021-03-02 DIAGNOSIS — I7121 Aneurysm of the ascending aorta, without rupture: Secondary | ICD-10-CM

## 2021-03-02 DIAGNOSIS — Z950 Presence of cardiac pacemaker: Secondary | ICD-10-CM | POA: Insufficient documentation

## 2021-03-02 HISTORY — DX: Carrier of other specified bacterial diseases: Z22.39

## 2021-03-02 HISTORY — DX: Aneurysm of the ascending aorta, without rupture: I71.21

## 2021-03-03 ENCOUNTER — Encounter: Payer: Self-pay | Admitting: Internal Medicine

## 2021-03-03 ENCOUNTER — Ambulatory Visit (INDEPENDENT_AMBULATORY_CARE_PROVIDER_SITE_OTHER): Payer: Medicare Other | Admitting: Internal Medicine

## 2021-03-03 ENCOUNTER — Telehealth: Payer: Self-pay | Admitting: Internal Medicine

## 2021-03-03 ENCOUNTER — Other Ambulatory Visit: Payer: Self-pay

## 2021-03-03 VITALS — BP 120/66 | HR 69 | Ht 64.0 in | Wt 141.0 lb

## 2021-03-03 DIAGNOSIS — J449 Chronic obstructive pulmonary disease, unspecified: Secondary | ICD-10-CM

## 2021-03-03 DIAGNOSIS — R918 Other nonspecific abnormal finding of lung field: Secondary | ICD-10-CM

## 2021-03-03 DIAGNOSIS — Z7901 Long term (current) use of anticoagulants: Secondary | ICD-10-CM

## 2021-03-03 MED ORDER — TRELEGY ELLIPTA 200-62.5-25 MCG/INH IN AEPB: 1.0000 | INHALATION_SPRAY | Freq: Every day | RESPIRATORY_TRACT | 0 refills | Status: DC

## 2021-03-03 NOTE — Telephone Encounter (Signed)
Received the following message from patient:   "Dr. [name]  Sorry to write you after just seeing you; however, I have a couple of questions.  I thought you were coming back in the room but since you didn't I want to ask a question regarding the nodules.  Even though they seem to be changing, was the statement, "no signs of cancer" relevant to them as well as everything else?  The other question is regarding the bronchoscopy.  The after visit summary indicates a 2-3 month follow up to discuss the results.  Does that mean I will need to wait that long to determine if there is an issue or does that mean I will need an appointment to discuss next steps.  When checking out, the girl advised that they do not yet have your schedule for June or July so I could not make  an appointment.  Thank you for addressing these questions.  [name] [phone]"  MR, can you please advise? Thanks!

## 2021-03-03 NOTE — Telephone Encounter (Signed)
PATIENT: [name] GENDER: female MRN: [id number] DOB: 10-18-1945 ADDRESS: [address] [place] [address]    Please schedule the following:  Diagnosis: Worsening pulmonary nodules and infiltrates.  Rule out MAI Procedure: Video bronchocoopy, flexible bronchoscopy with BALONLY .  No biopsy  Envisia Classifer Transbronchial biopsy: No  anesthesia: moderate sedation. Go via oral cavity .  Were not going to stop Coumadin Do you need Fluro? no Size of Scope: Small scope Pre-med nebulized lidocaine: Yes Priority: non urgent Date: June 15 or 16, 2022.  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 no DM? See beloOr Latex allergy?  See below Medication Restriction: is on coumadin which she will continue Anticoagulate/Antiplatelet: yes Pre-op Labs Ordered:none Imaging request: none       MISCELLANEOUS KEY INSTRUCTIONS    Please coordinate Pre-op COVID Testing   Please let Dr [name] know via reply phone message on Epic  Thank you     Key patient medical info     Allergy History:  Allergies  Allergen Reactions  . Codeine Nausea Only  . Sulfa Antibiotics Nausea Only  . Sulfonamide Derivatives Nausea Only     Current Outpatient Medications:  .  Fluticasone-Umeclidin-Vilant (TRELEGY ELLIPTA) 200-62.5-25 MCG/INH AEPB, Inhale 1 puff into the lungs daily., Disp: 2 each, Rfl: 0 .  acetaminophen (TYLENOL) 500 MG tablet, Take 2 tablets (1,000 mg total) by mouth daily., Disp: , Rfl:  .  albuterol (VENTOLIN HFA) 108 (90 Base) MCG/ACT inhaler, Inhale 2 puffs into the lungs every 6 (six) hours as needed for wheezing., Disp: 18 g, Rfl: 6 .  amoxicillin (AMOXIL) 500 MG capsule, TAKE 4 CAPSULES BY MOUTH 30-60 MINUTES PRIOR TO APPOINTMENT (Patient not taking: Reported on 03/03/2021), Disp: 4 capsule, Rfl: 12 .  Biotin 1000 MCG tablet, Take 1,000 mcg by mouth daily., Disp: , Rfl:  .  Calcium  Carb-Cholecalciferol 600-800 MG-UNIT TABS, Take 1 tablet by mouth every evening., Disp: , Rfl:  .  docusate sodium (COLACE) 100 MG capsule, Take 200 mg by mouth every evening., Disp: , Rfl:  .  furosemide (LASIX) 40 MG tablet, TAKE 1 TABLET(40 MG) BY MOUTH DAILY, Disp: 30 tablet, Rfl: 11 .  gabapentin (NEURONTIN) 100 MG capsule, Take 300 mg by mouth at bedtime., Disp: , Rfl:  .  GEMTESA 75 MG TABS, Take 1 tablet by mouth daily., Disp: , Rfl:  .  ipratropium-albuterol (DUONEB) 0.5-2.5 (3) MG/3ML SOLN, Take 3 mLs by nebulization every 6 (six) hours as needed., Disp: 360 mL, Rfl: 0 .  lovastatin (MEVACOR) 40 MG tablet, TAKE 1 TABLET(40 MG) BY MOUTH AT BEDTIME, Disp: 90 tablet, Rfl: 3 .  montelukast (SINGULAIR) 10 MG tablet, TAKE 1 TABLET(10 MG) BY MOUTH AT BEDTIME, Disp: 30 tablet, Rfl: 11 .  Multiple Vitamins-Minerals (PRESERVISION AREDS 2 PO), Take 2 capsules by mouth every evening. , Disp: , Rfl:  .  nitrofurantoin (MACRODANTIN) 100 MG capsule, Take 100 mg by mouth daily., Disp: , Rfl:  .  polycarbophil (FIBERCON) 625 MG tablet, Take 625 mg by mouth every evening. , Disp: , Rfl:  .  polyethylene glycol (MIRALAX / GLYCOLAX) packet, Take 17 g by mouth daily., Disp: 14 each, Rfl: 0 .  potassium chloride (KLOR-CON) 10 MEQ tablet, Take 2 tablets (20 mEq total) by mouth daily., Disp: 60 tablet, Rfl: 9 .  silver sulfADIAZINE (SILVADENE) 1 % cream, Apply 1 application topically daily. (Patient not taking:  Reported on 03/03/2021), Disp: 50 g, Rfl: 1 .  Talc (ZEASORB EX), Apply 1 application topically daily as needed (yeast infection under stomach). Under skin folds (abdomen) (Patient not taking: Reported on 03/03/2021), Disp: , Rfl:  .  temazepam (RESTORIL) 7.5 MG capsule, Take 1 capsule (7.5 mg total) by mouth at bedtime as needed for sleep. (Patient not taking: Reported on 03/03/2021), Disp: 30 capsule, Rfl: 0 .  traMADol (ULTRAM) 50 MG tablet, TAKE 1 TABLET(50 MG) BY MOUTH TWICE DAILY AS NEEDED FOR MODERATE  PAIN, Disp: 20 tablet, Rfl: 0 .  TRELEGY ELLIPTA 200-62.5-25 MCG/INH AEPB, INHALE 1 PUFF INTO THE LUNGS DAILY, Disp: 60 each, Rfl: 5 .  vitamin B-12 (CYANOCOBALAMIN) 500 MCG tablet, Take 500 mcg by mouth every other day. , Disp: , Rfl:  .  warfarin (COUMADIN) 5 MG tablet, TAKE 1 TO 2 TABLETS BY MOUTH DAILY AS DIRECTED BY COUMADIN CLINIC, Disp: 60 tablet, Rfl: 5 .  Zinc 50 MG CAPS, Take 1 capsule (50 mg total) by mouth daily., Disp: , Rfl: 0 No current facility-administered medications for this visit.  Facility-Administered Medications Ordered in Other Visits:  .  ondansetron (ZOFRAN) 4 mg in sodium chloride 0.9 % 50 mL IVPB, 4 mg, Intravenous, Q6H PRN, Kristeen Miss, MD   has a past medical history of Acute cystitis without hematuria (12/22/2015), Anemia, Arthritis, CAP (community acquired pneumonia) (02/08/2016), CAP (community acquired pneumonia) (02/08/2016), Cataracts, bilateral, Chronic cholecystitis with calculus (01/21/2015), Chronic diastolic heart failure ([place]) (09/19/2014), Chronic insomnia, Constipation, COPD (chronic obstructive pulmonary disease) ([place]), DDD (degenerative disc disease), Dyspnea, Essential hypertension, GERD (gastroesophageal reflux disease), History of pneumonia (02/16/2016), History of radiation therapy (05/30/11 to 07/07/11), History of shingles, Hyperlipemia, Legally blind in left eye, as defined in [place], Lung nodule, Obesity, Osteopenia (12/2014), Pancreatitis (11/2014), PONV (postoperative nausea and vomiting), Presence of permanent cardiac pacemaker, Rectal cancer ([place]) (04/2011), Research study patient (08/26/2016), Rheumatic heart disease mitral stenosis, S/P AVR (aortic valve replacement) (2015), S/P MVR (mitral valve replacement) (2015), Seizures ([place]), Sepsis secondary to UTI ([place]) (08/22/2017), Severe aortic valve stenosis  AVR 3/15 (10/01/2013), Sleep apnea, Small bowel obstruction, partial ([place]) (08/2013), and Vitamin B12 deficiency (02/28/2015).    has a past surgical  history that includes Foot surgery (left foot); Hand surgery ([place]); Tonsillectomy; Colon resection (08/25/2011); Ileostomy (08/25/2011); Ileostomy closure (04/02/2012); Bowel resection (04/02/2012); Carpal tunnel release (Bilateral); TEE without cardioversion (N/A, 11/29/2013); Aortic valve replacement (N/A, 12/12/2013); Mitral valve replacement (N/A, 12/12/2013); Intraoprative transesophageal echocardiogram (N/A, 12/12/2013); Colonoscopy (03/2013); left and right heart catheterization with coronary angiogram (N/A, 11/27/2013); Cholecystectomy (N/A, 01/21/2015); Back surgery (2006, 2007); Colonoscopy with propofol (N/A, 06/09/2016); Cardiac catheterization (N/A, 06/16/2016); Cataract extraction w/ intraocular lens implant (Left, 10/18/2016); Cataract extraction w/ intraocular lens implant (Right, 12/13/2016); Anterior lat lumbar fusion (Left, 07/17/2018); and TEE without cardioversion (N/A, 06/04/2019).   SIGNATURE    Dr. Brand Males, [name], F.C.C.P,  Pulmonary and Critical Care Medicine Staff Physician, [place] Director - Interstitial Lung Disease  Program  Pulmonary [place] at [place], [place], [address]  Pager: [phone], If no answer or between  15:00h - 7:00h: call [phone] Telephone: [phone]  9:20 AM 03/03/2021

## 2021-03-03 NOTE — Telephone Encounter (Signed)
I initially thought that her episode of amaurosis fugax was cardioembolic, but since it is happening again and again in the same pattern, that would be highly unlikely if not impossible.  That makes me feel a little less worried about stopping the Coumadin for a short period of time.  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  Okay to stop Coumadin, but then I would "bridge" with Lovenox..  Please let me know what you and she decide and I can ask our clinical pharmacist to help coordinate the "bridging" protocol.  Thanks, [employer]

## 2021-03-03 NOTE — Telephone Encounter (Signed)
Appears her PCP manages her INR and warfarin.  Forwarding to organize bridge

## 2021-03-03 NOTE — Telephone Encounter (Signed)
Mihai  Elverda Alfonse Flavors  I s oj coumadin.  I plan to do a bronchoscopy with lavage but no biopsy.  I believe I can do it with Coumadin.  The main risk is epistaxis when I pass the bronchoscope through the nose.  Other risk is scope trauma but I think the overall risk is low.  I can try to go through the oral cavity with a bite block or a safe intubation.  Typically I can do the procedure with moderate sedation.  Trying to see what the risk threshold for stopping Coumadin is.  She also wanted me to check that with you   Thanks    SIGNATURE    Dr. Brand Males, [name], F.C.C.P,  Pulmonary and Critical Care Medicine Staff Physician, [place] Director - Interstitial Lung Disease  Program  Pulmonary [place] at [place], [place], [address]  Pager: [phone], If no answer  OR between  19:00-7:00h: page [phone] Telephone (clinical office): [phone] Telephone (research): [phone]  9:12 AM 03/03/2021

## 2021-03-03 NOTE — Progress Notes (Signed)
PCP Diane Bush, MD  HPI    OV 08/02/2017  Chief Complaint  Patient presents with  . Follow-up    Pt states that she got bronchitis x1 month ago. Has been on two rounds of abx and two rounds of steroids. States that it is not in her chest anymore but is still having drainage, sore throat, dry cough, occ. SOB. Denies any CP.    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 She comes for routine follow-up. Recently she had COPD flareup was treated by primary care. Then I called in antibiotic and repeat prednisone and this again helped her. Nevertheless she is frustrated by postnasal drainage and the resultant cough. She says normally for COPD exacerbations are associated with initial onset of sinus congestion. Then she's always left with a residual postnasal drip but this time it has lingered longer. She says Sudafed helps her but is no longer available over-the-counter. She really wants relief about this critically. COPD cat score is 14. Is also asking me to sign handicap parking sticker   OV 10/31/2017  Chief Complaint  Patient presents with  . Follow-up    In hosp. 11/13-11/15 due to sepsis from UTI. Pt states she is doing good but can't seem to get her energy back. Pt currently has laryngitis and SOB with exertion.    Follow-up Gold stage II/III COPD not on home oxygen but on triple inhaler therapy.  Is a routine follow-up.  Since I last saw her in the middle of November 2018 she got diagnosed with E. coli sepsis and admitted to the hospital.  Since then she has significant amount of fatigue.  The fatigue is not improved much.  She has suspended her maintenance pulmonary rehabilitation but plans to restart it in March 2019.  She has not had any COPD exacerbations.  She is up-to-date with the flu shot.  Overall COPD CAT score is stable but there is an increase in the level of fatigue.  The only new issue that she complains about is some mild  hoarseness in voice going on for several months.  This is present early in the morning when she wakes up and then as the day goes on it is better.  She notices it when she talks a lot of time in the phone after a long period of rest.  It is insidious onset.  It is not progressive.  There are no other associated symptoms.   OV 02/05/2018  Chief Complaint  Patient presents with  . Follow-up    Pt states she saw PCP two weeks ago and had pna. Pt states she is still not over the pna and has no energy, has hoarseness in voice, cough, and has chest tightness.     Follow-up gold stage II/III COPD not on home oxygen but on triple inhaler therapy with Singulair  Routine follow-up.  She tells me t of labs from January 22, 2018 hat January 22, 2018 she started feeling chest tightness wheezing and also increase head cold.  She saw her primary care physician Dr. Danise [name].  Apparently all the blood work was normal except for mild increase in liver enzymes which she says she fluctuates with.  He was initially thinking a sinus congestion but when he did an x-ray of the report came back as a right lower lobe pneumonia [I personally visualized this x-ray and I agree she might not have right lower lobe pneumonia because in 2015  CT chest she had some chronic scarring at that area].  She was treated with 5-day Levaquin and a Medrol Dosepak but despite this other than subjective fevers are resolving she still has significant cough congestion and chest tightness without any orthopnea proximal nocturnal dyspnea.  COPD CAT score shows significant deterioration.  She is not feeling well.  Of note, she is intentionally losing weight because of weight watchers but she has not increased her fluid intake adequately.  She has quit all diet soda  Show slight increase in liver enzymes and also slight increase in creatinine compared to 6 months ago.   OV 03/13/2018  Chief Complaint  Patient presents with  . Follow-up    Breathing  is improved since last OV. Still reports DOE and occasional cough. Denies chest tightness or wheezing.    [name]  presents for follow-up of her gold stage II /3 of COPD.  On triple inhaler therapy with Spiriva and Brio and also on Singulair   Overall in terms of her COPD: She is stable.  She is compliant with her inhalers.  She is not on oxygen therapy.  Her symptoms actually improved.  In the interim she was diagnosed with atrial fibrillation and she is placed on Xarelto.  She is frustrated because she has to be on 1 more extra medication.  She did have pulmonary function test that is documented below because it has been a while since we did one.  It actually shows improvement in the lung function.  She also had a CT scan of the chest because the last one was in 2015.  This shows scattered lung nodules with the largest one 8 mm in the right lower lobe.  The radiologist has described this is nonspecific in appearance.  She heard the result and she has been extremely anxious and upset.  She is worried that her colon cancer has metastasized or she has lung cancer.  Of note, I personally visualized the CT chest and agree with the findings   OV 06/27/2018  Subjective:  Patient ID: [name], female , DOB: 10/11/1945 , age 71 y.o. , MRN: [id number] , ADDRESS: [address] [place] [address]   06/27/2018 -   Chief Complaint  Patient presents with  . Follow-up    Pt states her breathing has been doing good since last visit. States she has been having some BP issues to the point she fainted and cracked some ribs.  Denies any problems with cough or CP.     HPI [name] 75 y.o. - follow-up for  - COPD: Overall this is stable with inhaler therapy. She has not had her flu shot. She wants to wait 2 weeks because she just had first shot of her shingles vaccine. She is up-to-date with her pneumonia vaccine. In the interim she has lost weight 60 pounds intentionally and this is  improved on Score  Multiple  Lung nodules: She had follow-up CT chest in September 2019 and compared to May 2019: One of the nodules is resolved to 16 mm and stable. She does have some rib fractures subacute and this is because she fell down. She's been having balance issues with the weight loss and orthostatic hypotension. Both of this is being managed by neurosurgery and primary care  Travel advice: She plans to have back surgery for her foot drop after this she plans to recover at her son's home near [place]. Therefore she wants a antibiotic and prednisone taper   to be handy with her in case of a future COPD exacerbation.    Results for Mutch, Rey S (MRN [id number]) as of 03/13/2018 11:58  Ref. Range 11/29/2013 16:04 04/01/2016 10:10 03/13/2018 10:36  FEV1-Pre Latest Units: L 0.93 1.35 1.42  FEV1-%Pred-Pre Latest Units: % 34 55 59   Results for Mizuno, Natalin S (MRN [id number]) as of 03/13/2018 11:58  Ref. Range 03/13/2018 10:36  DLCO cor Latest Units: ml/min/mmHg 22.70  DLCO cor % pred Latest Units: % 84     CT chest Sept 4, 2019 Lungs/Pleura: Pleural thickening posteriorly in the right hemithorax is stable. There is no significant pleural effusion. There is stable subpleural scarring posteriorly in the right lower lobe. There is central airway thickening with scattered tree-in-bud nodularity in both lungs, similar to the most recent prior study. Nodular scarring in both upper lobes is stable. The right lower lobe perifissural nodule present on the most recent study has resolved. There is a stable 6 x 5 mm right lower lobe nodule on image 119/3. No new or enlarging pulmonary nodules.  IMPRESSION: 1. Generally stable chronic lung disease with pleuroparenchymal scarring and scattered nodularity. Most of these nodules have a tree-in-bud appearance, likely post infectious or inflammatory. The dominant right lower lobe nodule seen on the most recent study has resolved. No new or  enlarging nodules. 2. No acute chest findings. Healing subacute right-sided rib fractures. 3. Extensive Aortic Atherosclerosis (ICD10-I70.0).   Electronically Signed   By: William  Veazey [name]   On: 06/13/2018 14   ROS - per HPI  OV 01/22/2020  Subjective:  Patient ID: [name], female , DOB: 12/08/1945 , age 73 y.o. , MRN: [id number] , ADDRESS: [address] [place] [address]   01/22/2020 -   Chief Complaint  Patient presents with  . Follow-up    Pt states she has been doing good since last visit and denies any complaints.     HPI [name] 75 y.o. -presents for follow-up.  Last seen in 2019.  After that because of the pandemic have not seen her.  She saw the nurse practitioner once.  The big interval changes that she has lost a lot of weight.  It is intentional because she is try to keep her self it.  From a functional standpoint COPD symptoms are much improved after weight loss.  She takes Spiriva Singulair and Breo.  However she is having significant back pain.  Other than that she feels fine.  She not able to do pulmonary rehab because of the back pain.  She has had a Covid vaccine  Other issues lung nodules.  Her last CT scan of the chest was in July 2020.  She has waxing and waning.  She is willing to have another CT scan of the chest at the appropriate time interval.      CAT COPD Symptom & Quality of Life Score (GSK trademark) 0 is no burden. 5 is highest burden 08/02/2017  10/31/2017  02/05/2018  06/27/2018 60+ # intentioal weight loss  Never Cough -> Cough all the time 1 2 4 1  No phlegm in chest -> Chest is full of phlegm 0 0 3 0  No chest tightness -> Chest feels very tight 1 0 3 0  No dyspnea for 1 flight stairs/hill -> Very dyspneic for 1 flight of stairs 5 4 5 4  No limitations for ADL at home -> Very limited with ADL at home 1 0 5 3    Confident leaving home -> Not at all confident leaving home 0 0 3 1  Sleep soundly -> Do  not sleep soundly because of lung condition 4 4 3 3  Lots of Energy -> No energy at all 2 3 5 4  TOTAL Score (max 40)  14 13 31 16   CAT Score 01/22/2020  Total CAT Score 9      ROS - per HPI    OV 07/14/2020  Subjective:  Patient ID: [name], female , DOB: 05/04/1946 , age 74 y.o. , MRN: [id number] , ADDRESS: [address] [place] [address]  PCP Gutierrez, Javier, MD    07/14/2020 -   Chief Complaint  Patient presents with  . Follow-up    no complaints    Advanced COPD on Spiriva and Breo and Singulair Multiple lung nodules with waxing and waning quality -last CT scan July 2020  HPI [name] 74 y.o. -presents for follow-up of her COPD.  Last seen in early part of this year.  She is supposed to have a follow-up CT scan of the chest but this has not happened.  Patient tells me that she wants to hold off on getting the CT scan of the chest right now but will do it at the time of follow-up.  Last CT scan of the chest was in July 2020.  At this point in time she continues on Spiriva, Breo and Singulair.  She feels well.  She has been social distancing.  She has had her Covid vaccine but will have a booster next week.  She will have a flu shot today but at the Coumadin clinic.  There are no active complaints.  She is interested in consolidating her Spiriva and Breo and to Trelegy.  She denies any recent exacerbations.   CAT Score 07/14/2020 01/22/2020  Total CAT Score 6 9     No flowsheet data found.   No flowsheet data found.    PFT Results Latest Ref Rng & Units 03/13/2018 04/01/2016 11/29/2013  FVC-Pre L 2.21 2.22 1.49  FVC-Predicted Pre % 70 68 42  FVC-Post L - 2.33 1.60  FVC-Predicted Post % - 72 45  Pre FEV1/FVC % % 64 61 63  Post FEV1/FCV % % - 63 65  FEV1-Pre L 1.42 1.35 0.93  FEV1-Predicted Pre % 59 55 34  FEV1-Post L - 1.47 1.05  DLCO uncorrected ml/min/mmHg 24.55 19.21 -  DLCO UNC% % 91 71 -  DLCO corrected ml/min/mmHg 22.70 -  -  DLCO COR %Predicted % 84 - -  DLVA Predicted % 102 95 -  TLC L - 5.00 -  TLC % Predicted % - 93 -  RV % Predicted % - 121 -      ROS - per HPI   OV 01/19/2021  Subjective:  Patient ID: [name], female , DOB: 03/16/1946 , age 74 y.o. , MRN: [id number] , ADDRESS: [address] [place] [address] PCP Gutierrez, Javier, MD Patient Care Team: Gutierrez, Javier, MD as PCP - General (Family Medicine) Croitoru, Mihai, MD as PCP - Cardiology (Cardiology) Jacobs, Daniel P, MD as Consulting Physician (Gastroenterology) Moody, John, MD as Consulting Physician (Radiation Oncology) Odogwu, Lauretta I, MD as Consulting Physician (Hematology and Oncology) Weintraub, Richard, MD (Inactive) as Consulting Physician (Cardiology) Sanam Marmo, MD as Consulting Physician (Pulmonary Disease) Byerly, Faera, MD (General Surgery) Croitoru, Mihai, MD as Consulting Physician (Cardiology) Stoneburner, Sara, MD as Consulting Physician (Ophthalmology) Jordan, Amy, MD as   Consulting Physician (Dermatology)  This Provider for this visit: Treatment Team:  Attending Provider: Mack, Brian P, NP    01/19/2021 -   Chief Complaint  Patient presents with  . Follow-up    No current respiratory concerns. Has questions about 4th COVID booster.     Advanced COPD on Spiriva and Breo and Singulair Multiple lung nodules with waxing and waning quality -last CT scan July 2020   HPI [name] 74 y.o. - COPD and nodule followup. Feels well. Trelegy works better. No new issues. WEight stable. Was supposed to have followup CT for nodules from July 2020 but did not. Has upcoming neck CT. She is ok if we pair CT chest with this schedule. She wants to know if she should take 4th covid booster. Her bias is that 3 is good enough +_ she is masking and being careful and would rather wait for higher prevalence of disease or vaccine to new strain    CAT Score 07/14/2020 01/22/2020  Total  CAT Score 6 9    OV 03/03/2021  Subjective:  Patient ID: [name], female , DOB: 12/09/1945 , age 74 y.o. , MRN: [id number] , ADDRESS: [address] [place] [address] PCP Gutierrez, Javier, MD Patient Care Team: Gutierrez, Javier, MD as PCP - General (Family Medicine) Croitoru, Mihai, MD as PCP - Cardiology (Cardiology) Jacobs, Daniel P, MD as Consulting Physician (Gastroenterology) Moody, John, MD as Consulting Physician (Radiation Oncology) Odogwu, Lauretta I, MD as Consulting Physician (Hematology and Oncology) Weintraub, Richard, MD (Inactive) as Consulting Physician (Cardiology) Patriece Archbold, MD as Consulting Physician (Pulmonary Disease) Byerly, Faera, MD (General Surgery) Croitoru, Mihai, MD as Consulting Physician (Cardiology) Stoneburner, Sara, MD as Consulting Physician (Ophthalmology) Jordan, Amy, MD as Consulting Physician (Dermatology)  This Provider for this visit: Treatment Team:  Attending Provider: Miquela Costabile, MD    03/03/2021 -   Chief Complaint  Patient presents with  . Follow-up    Pt had CT performed 4/19 and is here today to discuss the results. States breathing is stable.   Advanced COPD on Spiriva and Breo and Singulair Multiple lung nodules with waxing and waning quality -last CT scan July 2020  HPI [name] 74 y.o. -returns for follow-up to discuss CT scan results.  She continues on Trelegy and she is feeling well but she is worried about the CT scan results.  I personally visualized the CT report.  It shows increase in nodular densities that are progressive in the last 2 years.  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  No evidence of cancer or pulmonary fibrosis.  Her overall symptom burden is minimal.  She had questions about what Mycobacterium avium complex is.  We went over this.  We discussed bronchoscopy.  She prefers to do it under sedation.  She is very concerned about the fact she is on Coumadin.  She does not want  to stop this because she has a heart valve.  She wants me to talk to her cardiologist about this.  Explained to her that by not doing a biopsy.  There is some risk of bleeding particularly with scope trauma either epistaxis or in the airway but this risk would be low.  We can try to minimize the risk by going to the oral cavity or even intubation.  I have written to cardiologist Dr. Croitoru about this.  Did outline general management plan with MAI.  CAT Score 07/14/2020 01/22/2020  Total CAT Score 6 9          CT Chest data 01/26/21   Mediastinum/Nodes: Mediastinal lymph nodes measure up to 12 mm in the low right paratracheal station, unchanged. Hilar regions are difficult to definitively evaluate without IV contrast. No axillary adenopathy. Esophagus is grossly unremarkable.  Lungs/Pleura: Biapical pleuroparenchymal scarring, right greater than left. Ill-defined peribronchovascular nodularity and peribronchovascular nodular consolidation, progressive from 04/22/2019. Largest new individual nodule measures 7 mm in the right middle lobe (5 x 8 mm, 3/97). Associated mild bronchiectasis and scattered mucoid impaction. Pleuroparenchymal scarring in the right middle and right lower lobes. No pleural fluid. Airway is unremarkable. IMPRESSION: 1. 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 2. Largest new individual nodule measures 7 mm in the right middle lobe and is strongly favored to be infectious in etiology. Non-contrast chest CT at 3-6 months is recommended. If the nodules are stable at time of repeat CT, then future CT at 18-24 months (from today's scan) is considered optional for low-risk patients, but is recommended for high-risk patients. This recommendation follows the consensus statement: Guidelines for Management of Incidental Pulmonary  Nodules Detected on CT Images: From the Fleischner Society 2017; Radiology 2017; 284:228-243. 3. Ascending aortic aneurysm, stable. Recommend annual imaging followup by CTA or MRA. This recommendation follows 2010 ACCF/AHA/AATS/ACR/ASA/SCA/SCAI/SIR/STS/SVM Guidelines for the Diagnosis and Management of Patients with Thoracic Aortic Disease. Circulation. 2010; 121: E266-e369. Aortic aneurysm NOS (ICD10-I71.9). 4.  Aortic atherosclerosis (ICD10-I70.0). 5. Enlarged pulmonic trunk, indicative pulmonary arterial hypertension.   Electronically Signed   By: Melinda  Blietz [name]   On: 01/27/2021 08:41      No results found.    PFT  PFT Results Latest Ref Rng & Units 03/13/2018 04/01/2016 11/29/2013  FVC-Pre L 2.21 2.22 1.49  FVC-Predicted Pre % 70 68 42  FVC-Post L - 2.33 1.60  FVC-Predicted Post % - 72 45  Pre FEV1/FVC % % 64 61 63  Post FEV1/FCV % % - 63 65  FEV1-Pre L 1.42 1.35 0.93  FEV1-Predicted Pre % 59 55 34  FEV1-Post L - 1.47 1.05  DLCO uncorrected ml/min/mmHg 24.55 19.21 -  DLCO UNC% % 91 71 -  DLCO corrected ml/min/mmHg 22.70 - -  DLCO COR %Predicted % 84 - -  DLVA Predicted % 102 95 -  TLC L - 5.00 -  TLC % Predicted % - 93 -  RV % Predicted % - 121 -       has a past medical history of Acute cystitis without hematuria (12/22/2015), Anemia, Arthritis, CAP (community acquired pneumonia) (02/08/2016), CAP (community acquired pneumonia) (02/08/2016), Cataracts, bilateral, Chronic cholecystitis with calculus (01/21/2015), Chronic diastolic heart failure (HCC) (09/19/2014), Chronic insomnia, Constipation, COPD (chronic obstructive pulmonary disease) (HCC), DDD (degenerative disc disease), Dyspnea, Essential hypertension, GERD (gastroesophageal reflux disease), History of pneumonia (02/16/2016), History of radiation therapy (05/30/11 to 07/07/11), History of shingles, Hyperlipemia, Legally blind in left eye, as defined in [place], Lung nodule, Obesity, Osteopenia (12/2014),  Pancreatitis (11/2014), PONV (postoperative nausea and vomiting), Presence of permanent cardiac pacemaker, Rectal cancer (HCC) (04/2011), Research study patient (08/26/2016), Rheumatic heart disease mitral stenosis, S/P AVR (aortic valve replacement) (2015), S/P MVR (mitral valve replacement) (2015), Seizures (HCC), Sepsis secondary to UTI (HCC) (08/22/2017), Severe aortic valve stenosis  AVR 3/15 (10/01/2013), Sleep apnea, Small bowel obstruction, partial (HCC) (08/2013), and Vitamin B12 deficiency (02/28/2015).   reports that she quit smoking about 46 years ago. Her smoking use included cigarettes. She has a 15.00 pack-year smoking history. She has never   used smokeless tobacco.  Past Surgical History:  Procedure Laterality Date  . ANTERIOR LAT LUMBAR FUSION Left 07/17/2018   Procedure: Lumbar Two-Three Lumbar Three-Four Anterolateral decompression/interbody fusion with lateral plate fixation/Infuse;  Surgeon: Elsner, Henry, MD;  Location: [facility];  Service: Neurosurgery;  Laterality: Left;  Lumbar Two-Three Lumbar Three-Four Anterolateral decompression/interbody fusion with lateral plate fixation/Infuse  . AORTIC VALVE REPLACEMENT N/A 12/12/2013   Procedure: AORTIC VALVE REPLACEMENT (AVR);  Surgeon: Bryan K Bartle, MD; Service: Open Heart Surgery  . BACK SURGERY  2006, 2007   2006 SPACER, 2007 decompression and fusion L4/5  . BOWEL RESECTION  04/02/2012   Procedure: SMALL BOWEL RESECTION;  Surgeon: Faera Byerly, MD;  Location: [facility];  Service: General;  Laterality: N/A;  . CARPAL TUNNEL RELEASE Bilateral   . CATARACT EXTRACTION W/ INTRAOCULAR LENS IMPLANT Left 10/18/2016  . CATARACT EXTRACTION W/ INTRAOCULAR LENS IMPLANT Right 12/13/2016   Dr. Stoneburner  . CHOLECYSTECTOMY N/A 01/21/2015   chronic cholecystitis, Faera Byerly, MD  . COLON RESECTION  08/25/2011   Procedure: COLON RESECTION LAPAROSCOPIC;  Surgeon: Faera Byerly, MD;  Location: [facility];  Service: General;  Laterality: N/A;  Laparoscopic  Assisted Low Anterior Resection Diverting Ostomy and onQ pain pump  . COLONOSCOPY  03/2013   1 polyp, rpt 3 yrs (Jacobs)  . COLONOSCOPY WITH PROPOFOL N/A 06/09/2016   patent colo-colonic anastomosis, rpt 5 yrs (Jacobs)  . EP IMPLANTABLE DEVICE N/A 06/16/2016   Procedure: Pacemaker Implant;  Surgeon: Will Martin Camnitz, MD;  Location: [facility];  Service: Cardiovascular;  Laterality: N/A;  . FOOT SURGERY  left foot   hammer toe  . HAND SURGERY  Bil   carpal tunnel  . ILEOSTOMY  08/25/2011  . ILEOSTOMY CLOSURE  04/02/2012   Procedure: ILEOSTOMY TAKEDOWN;  Surgeon: Faera Byerly, MD;  Location: [facility];  Service: General;  Laterality: N/A;  . INTRAOPERATIVE TRANSESOPHAGEAL ECHOCARDIOGRAM N/A 12/12/2013   Procedure: INTRAOPERATIVE TRANSESOPHAGEAL ECHOCARDIOGRAM;  Surgeon: Bryan K Bartle, MD;  Location: [facility];  Service: Open Heart Surgery;  Laterality: N/A;  . LEFT AND RIGHT HEART CATHETERIZATION WITH CORONARY ANGIOGRAM N/A 11/27/2013   Procedure: LEFT AND RIGHT HEART CATHETERIZATION WITH CORONARY ANGIOGRAM;  Surgeon: Mihai Croitoru, MD;  Location: [facility];  Service: Cardiovascular;  Laterality: N/A;  . MITRAL VALVE REPLACEMENT N/A 12/12/2013   Procedure: MITRAL VALVE (MV) REPLACEMENT OR REPAIR;  Surgeon: Bryan K Bartle, MD; Service: Open Heart Surgery  . TEE WITHOUT CARDIOVERSION N/A 11/29/2013   Procedure: TRANSESOPHAGEAL ECHOCARDIOGRAM (TEE);  Surgeon: Traci R Turner, MD;  Location: [facility];  Service: Cardiovascular;  Laterality: N/A;  . TEE WITHOUT CARDIOVERSION N/A 06/04/2019   Procedure: TRANSESOPHAGEAL ECHOCARDIOGRAM (TEE);  Surgeon: Croitoru, Mihai, MD;  Location: [facility];  Service: Cardiovascular;  Laterality: N/A;  . TONSILLECTOMY      Allergies  Allergen Reactions  . Codeine Nausea Only  . Sulfa Antibiotics Nausea Only  . Sulfonamide Derivatives Nausea Only    Immunization History  Administered Date(s) Administered  . Fluad Quad(high Dose 65+) 07/11/2019,  07/14/2020  . H1N1 09/17/2008  . Influenza Split 07/18/2012  . Influenza Whole 08/10/2005, 06/24/2009, 07/11/2011  . Influenza, High Dose Seasonal PF 07/04/2016, 07/04/2017  . Influenza,inj,Quad PF,6+ Mos 07/09/2018  . Influenza-Unspecified 07/24/2013, 06/30/2014, 06/30/2015  . Moderna SARS-COV2 Booster Vaccination 08/24/2020  . Moderna Sars-Covid-2 Vaccination 02/26/2021  . PFIZER(Purple Top)SARS-COV-2 Vaccination 10/30/2019, 11/20/2019  . Pneumococcal Conjugate-13 06/02/2014  . Pneumococcal Polysaccharide-23 08/10/2005, 04/21/2008, 06/25/2013  . Td 10/11/2005  . Zoster 09/16/2014  .   Zoster Recombinat (Shingrix) 06/25/2018, 08/28/2018    Family History  Problem Relation Age of Onset  . Lung cancer Father   . Heart disease Mother   . Diabetes Mother   . Hypertension Mother   . Stroke Mother   . CAD Son 39  . Heart attack Son 39  . Heart attack Maternal Grandfather   . Breast cancer Maternal Aunt 83  . Colon cancer Neg Hx   . Esophageal cancer Neg Hx   . Stomach cancer Neg Hx   . Rectal cancer Neg Hx      Current Outpatient Medications:  .  acetaminophen (TYLENOL) 500 MG tablet, Take 2 tablets (1,000 mg total) by mouth daily., Disp: , Rfl:  .  albuterol (VENTOLIN HFA) 108 (90 Base) MCG/ACT inhaler, Inhale 2 puffs into the lungs every 6 (six) hours as needed for wheezing., Disp: 18 g, Rfl: 6 .  Biotin 1000 MCG tablet, Take 1,000 mcg by mouth daily., Disp: , Rfl:  .  Calcium Carb-Cholecalciferol 600-800 MG-UNIT TABS, Take 1 tablet by mouth every evening., Disp: , Rfl:  .  docusate sodium (COLACE) 100 MG capsule, Take 200 mg by mouth every evening., Disp: , Rfl:  .  furosemide (LASIX) 40 MG tablet, TAKE 1 TABLET(40 MG) BY MOUTH DAILY, Disp: 30 tablet, Rfl: 11 .  gabapentin (NEURONTIN) 100 MG capsule, Take 300 mg by mouth at bedtime., Disp: , Rfl:  .  GEMTESA 75 MG TABS, Take 1 tablet by mouth daily., Disp: , Rfl:  .  ipratropium-albuterol (DUONEB) 0.5-2.5 (3) MG/3ML SOLN, Take  3 mLs by nebulization every 6 (six) hours as needed., Disp: 360 mL, Rfl: 0 .  lovastatin (MEVACOR) 40 MG tablet, TAKE 1 TABLET(40 MG) BY MOUTH AT BEDTIME, Disp: 90 tablet, Rfl: 3 .  montelukast (SINGULAIR) 10 MG tablet, TAKE 1 TABLET(10 MG) BY MOUTH AT BEDTIME, Disp: 30 tablet, Rfl: 11 .  Multiple Vitamins-Minerals (PRESERVISION AREDS 2 PO), Take 2 capsules by mouth every evening. , Disp: , Rfl:  .  nitrofurantoin (MACRODANTIN) 100 MG capsule, Take 100 mg by mouth daily., Disp: , Rfl:  .  polycarbophil (FIBERCON) 625 MG tablet, Take 625 mg by mouth every evening. , Disp: , Rfl:  .  polyethylene glycol (MIRALAX / GLYCOLAX) packet, Take 17 g by mouth daily., Disp: 14 each, Rfl: 0 .  potassium chloride (KLOR-CON) 10 MEQ tablet, Take 2 tablets (20 mEq total) by mouth daily., Disp: 60 tablet, Rfl: 9 .  traMADol (ULTRAM) 50 MG tablet, TAKE 1 TABLET(50 MG) BY MOUTH TWICE DAILY AS NEEDED FOR MODERATE PAIN, Disp: 20 tablet, Rfl: 0 .  TRELEGY ELLIPTA 200-62.5-25 MCG/INH AEPB, INHALE 1 PUFF INTO THE LUNGS DAILY, Disp: 60 each, Rfl: 5 .  vitamin B-12 (CYANOCOBALAMIN) 500 MCG tablet, Take 500 mcg by mouth every other day. , Disp: , Rfl:  .  warfarin (COUMADIN) 5 MG tablet, TAKE 1 TO 2 TABLETS BY MOUTH DAILY AS DIRECTED BY COUMADIN CLINIC, Disp: 60 tablet, Rfl: 5 .  Zinc 50 MG CAPS, Take 1 capsule (50 mg total) by mouth daily., Disp: , Rfl: 0 .  amoxicillin (AMOXIL) 500 MG capsule, TAKE 4 CAPSULES BY MOUTH 30-60 MINUTES PRIOR TO APPOINTMENT (Patient not taking: Reported on 03/03/2021), Disp: 4 capsule, Rfl: 12 .  silver sulfADIAZINE (SILVADENE) 1 % cream, Apply 1 application topically daily. (Patient not taking: Reported on 03/03/2021), Disp: 50 g, Rfl: 1 .  Talc (ZEASORB EX), Apply 1 application topically daily as needed (yeast infection under stomach). Under skin   folds (abdomen) (Patient not taking: Reported on 03/03/2021), Disp: , Rfl:  .  temazepam (RESTORIL) 7.5 MG capsule, Take 1 capsule (7.5 mg total) by mouth  at bedtime as needed for sleep. (Patient not taking: Reported on 03/03/2021), Disp: 30 capsule, Rfl: 0 No current facility-administered medications for this visit.  Facility-Administered Medications Ordered in Other Visits:  .  ondansetron (ZOFRAN) 4 mg in sodium chloride 0.9 % 50 mL IVPB, 4 mg, Intravenous, Q6H PRN, Elsner, Henry, MD      Objective:   Vitals:   03/03/21 0850  BP: 120/66  Pulse: 69  SpO2: 97%  Weight: 141 lb (64 kg)  Height: 5' 4" (1.626 m)    Estimated body mass index is 24.2 kg/m as calculated from the following:   Height as of this encounter: 5' 4" (1.626 m).   Weight as of this encounter: 141 lb (64 kg).  @WEIGHTCHANGE@  Filed Weights   03/03/21 0850  Weight: 141 lb (64 kg)     Physical Exam  General: No distress. think Neuro: Alert and Oriented x 3. GCS 15. Speech normal Psych: Pleasant Resp:  Barrel Chest - no.  Wheeze - noi, Crackles - no, No overt respiratory distress CVS: Normal heart sounds. Murmurs - n Ext: Stigmata of Connective Tissue Disease - no HEENT: Normal upper airway. PEERL +. No post nasal drip        Assessment:       ICD-10-CM   1. Multiple lung nodules on CT  R91.8   2. Moderate COPD (chronic obstructive pulmonary disease) (HCC)  J44.9        Plan:     Patient Instructions     Multiple lung nodules on CT - per radiologist these nodules are new since 2015 -> may 2019 -> waxing and waning July 2020. 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  It will be at [place].  I have also written to Dr. C about Coumadin.  We will try to do it without stopping Coumadin  Moderate COPD (chronic obstructive pulmonary disease) (HCC) - stable and better with trelegy  plan -Trelegy 200 daily  - take 1 sample - alb as needed   Followup -2-3 months from now to discuss  bronchoscopy results      SIGNATURE    Dr. Jordain Radin, [name], F.C.C.P,  Pulmonary and Critical Care Medicine Staff Physician, [facility] Director - Interstitial Lung Disease  Program  [facility], [place], [address]  Pager: [phone], If no answer or between  15:00h - 7:00h: call [phone] Telephone: [phone]  9:18 AM 03/03/2021  

## 2021-03-03 NOTE — Addendum Note (Signed)
Addended by: Birder Robson on: 03/03/2021 09:22 AM   Modules accepted: Orders

## 2021-03-03 NOTE — H&P (View-Only) (Signed)
PCP Ria Bush, MD  HPI    OV 08/02/2017  Chief Complaint  Patient presents with  . Follow-up    Pt states that she got bronchitis x1 month ago. Has been on two rounds of abx and two rounds of steroids. States that it is not in her chest anymore but is still having drainage, sore throat, dry cough, occ. SOB. Denies any CP.    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 She comes for routine follow-up. Recently she had COPD flareup was treated by primary care. Then I called in antibiotic and repeat prednisone and this again helped her. Nevertheless she is frustrated by postnasal drainage and the resultant cough. She says normally for COPD exacerbations are associated with initial onset of sinus congestion. Then she's always left with a residual postnasal drip but this time it has lingered longer. She says Sudafed helps her but is no longer available over-the-counter. She really wants relief about this critically. COPD cat score is 14. Is also asking me to sign handicap parking sticker   OV 10/31/2017  Chief Complaint  Patient presents with  . Follow-up    In hosp. 11/13-11/15 due to sepsis from UTI. Pt states she is doing good but can't seem to get her energy back. Pt currently has laryngitis and SOB with exertion.    Follow-up Gold stage II/III COPD not on home oxygen but on triple inhaler therapy.  Is a routine follow-up.  Since I last saw her in the middle of November 2018 she got diagnosed with E. coli sepsis and admitted to the hospital.  Since then she has significant amount of fatigue.  The fatigue is not improved much.  She has suspended her maintenance pulmonary rehabilitation but plans to restart it in March 2019.  She has not had any COPD exacerbations.  She is up-to-date with the flu shot.  Overall COPD CAT score is stable but there is an increase in the level of fatigue.  The only new issue that she complains about is some mild  hoarseness in voice going on for several months.  This is present early in the morning when she wakes up and then as the day goes on it is better.  She notices it when she talks a lot of time in the phone after a long period of rest.  It is insidious onset.  It is not progressive.  There are no other associated symptoms.   OV 02/05/2018  Chief Complaint  Patient presents with  . Follow-up    Pt states she saw PCP two weeks ago and had pna. Pt states she is still not over the pna and has no energy, has hoarseness in voice, cough, and has chest tightness.     Follow-up gold stage II/III COPD not on home oxygen but on triple inhaler therapy with Singulair  Routine follow-up.  She tells me t of labs from January 22, 2018 hat January 22, 2018 she started feeling chest tightness wheezing and also increase head cold.  She saw her primary care physician Dr. Danise Mina.  Apparently all the blood work was normal except for mild increase in liver enzymes which she says she fluctuates with.  He was initially thinking a sinus congestion but when he did an x-ray of the report came back as a right lower lobe pneumonia [I personally visualized this x-ray and I agree she might not have right lower lobe pneumonia because in 2015  CT chest she had some chronic scarring at that area].  She was treated with 5-day Levaquin and a Medrol Dosepak but despite this other than subjective fevers are resolving she still has significant cough congestion and chest tightness without any orthopnea proximal nocturnal dyspnea.  COPD CAT score shows significant deterioration.  She is not feeling well.  Of note, she is intentionally losing weight because of weight watchers but she has not increased her fluid intake adequately.  She has quit all diet soda  Show slight increase in liver enzymes and also slight increase in creatinine compared to 6 months ago.   OV 03/13/2018  Chief Complaint  Patient presents with  . Follow-up    Breathing  is improved since last OV. Still reports DOE and occasional cough. Denies chest tightness or wheezing.    Alla J  presents for follow-up of her gold stage II /3 of COPD.  On triple inhaler therapy with Spiriva and Brio and also on Singulair   Overall in terms of her COPD: She is stable.  She is compliant with her inhalers.  She is not on oxygen therapy.  Her symptoms actually improved.  In the interim she was diagnosed with atrial fibrillation and she is placed on Xarelto.  She is frustrated because she has to be on 1 more extra medication.  She did have pulmonary function test that is documented below because it has been a while since we did one.  It actually shows improvement in the lung function.  She also had a CT scan of the chest because the last one was in 2015.  This shows scattered lung nodules with the largest one 8 mm in the right lower lobe.  The radiologist has described this is nonspecific in appearance.  She heard the result and she has been extremely anxious and upset.  She is worried that her colon cancer has metastasized or she has lung cancer.  Of note, I personally visualized the CT chest and agree with the findings   OV 06/27/2018  Subjective:  Patient ID: [name], female , DOB: March 25, 1946 , age 56 y.o. , MRN: [id number] , ADDRESS: [address] [place] [address]   06/27/2018 -   Chief Complaint  Patient presents with  . Follow-up    Pt states her breathing has been doing good since last visit. States she has been having some BP issues to the point she fainted and cracked some ribs.  Denies any problems with cough or CP.     HPI [name] 75 y.o. - follow-up for  - COPD: Overall this is stable with inhaler therapy. She has not had her flu shot. She wants to wait 2 weeks because she just had first shot of her shingles vaccine. She is up-to-date with her pneumonia vaccine. In the interim she has lost weight 60 pounds intentionally and this is  improved on Score  Multiple  Lung nodules: She had follow-up CT chest in September 2019 and compared to May 2019: One of the nodules is resolved to 16 mm and stable. She does have some rib fractures subacute and this is because she fell down. She's been having balance issues with the weight loss and orthostatic hypotension. Both of this is being managed by neurosurgery and primary care  Travel advice: She plans to have back surgery for her foot drop after this she plans to recover at her son's home near [place]. Therefore she wants a antibiotic and prednisone taper  to be handy with her in case of a future COPD exacerbation.    Results for JAISHA, VILLACRES (MRN [id number]) as of 03/13/2018 11:58  Ref. Range 11/29/2013 16:04 04/01/2016 10:10 03/13/2018 10:36  FEV1-Pre Latest Units: L 0.93 1.35 1.42  FEV1-%Pred-Pre Latest Units: % 34 55 59   Results for LAMANDA, RUDDER (MRN [id number]) as of 03/13/2018 11:58  Ref. Range 03/13/2018 10:36  DLCO cor Latest Units: ml/min/mmHg 22.70  DLCO cor % pred Latest Units: % 84     CT chest Sept 4, 2019 Lungs/Pleura: Pleural thickening posteriorly in the right hemithorax is stable. There is no significant pleural effusion. There is stable subpleural scarring posteriorly in the right lower lobe. There is central airway thickening with scattered tree-in-bud nodularity in both lungs, similar to the most recent prior study. Nodular scarring in both upper lobes is stable. The right lower lobe perifissural nodule present on the most recent study has resolved. There is a stable 6 x 5 mm right lower lobe nodule on image 119/3. No new or enlarging pulmonary nodules.  IMPRESSION: 1. Generally stable chronic lung disease with pleuroparenchymal scarring and scattered nodularity. Most of these nodules have a tree-in-bud appearance, likely post infectious or inflammatory. The dominant right lower lobe nodule seen on the most recent study has resolved. No new or  enlarging nodules. 2. No acute chest findings. Healing subacute right-sided rib fractures. 3. Extensive Aortic Atherosclerosis (ICD10-I70.0).   Electronically Signed   By: Richardean Sale [name]   On: 06/13/2018 14   ROS - per HPI  OV 01/22/2020  Subjective:  Patient ID: [name], female , DOB: [birth year] , age 34 y.o. , MRN: [id number] , ADDRESS: [address] [place] [address]   01/22/2020 -   Chief Complaint  Patient presents with  . Follow-up    Pt states she has been doing good since last visit and denies any complaints.     HPI [name] 75 y.o. -presents for follow-up.  Last seen in 2019.  After that because of the pandemic have not seen her.  She saw the nurse practitioner once.  The big interval changes that she has lost a lot of weight.  It is intentional because she is try to keep her self it.  From a functional standpoint COPD symptoms are much improved after weight loss.  She takes Spiriva Singulair and Breo.  However she is having significant back pain.  Other than that she feels fine.  She not able to do pulmonary rehab because of the back pain.  She has had a Covid vaccine  Other issues lung nodules.  Her last CT scan of the chest was in July 2020.  She has waxing and waning.  She is willing to have another CT scan of the chest at the appropriate time interval.      CAT COPD Symptom & Quality of Life Score ([place]) 0 is no burden. 5 is highest burden 08/02/2017  10/31/2017  02/05/2018  06/27/2018 60+ # intentioal weight loss  Never Cough -> Cough all the time _0 No phlegm in chest -> Chest is full of phlegm 0 0 3 0  No chest tightness -> Chest feels very tight 1 0 3 0  No dyspnea for 1 flight stairs/hill -> Very dyspneic for 1 flight of stairs _1 No limitations for ADL at home -> Very limited with ADL at home 1 0 5 3  Confident leaving home -> Not at all confident leaving home 0 0 3 1  Sleep soundly -> Do  not sleep soundly because of lung condition _0 Lots of Energy -> No energy at all _1 TOTAL Score (max 40)  _2 CAT Score 01/22/2020  Total CAT Score 9      ROS - per HPI    OV 07/14/2020  Subjective:  Patient ID: Danajah Alfonse [name], female , DOB: 07/14/1946 , age 19 y.o. , MRN: [id number] , ADDRESS: [address] [place] [address]  PCP Ria Bush, MD    07/14/2020 -   Chief Complaint  Patient presents with  . Follow-up    no complaints    Advanced COPD on Spiriva and Breo and Singulair Multiple lung nodules with waxing and waning quality -last CT scan July 2020  HPI [name] 75 y.o. -presents for follow-up of her COPD.  Last seen in early part of this year.  She is supposed to have a follow-up CT scan of the chest but this has not happened.  Patient tells me that she wants to hold off on getting the CT scan of the chest right now but will do it at the time of follow-up.  Last CT scan of the chest was in July 2020.  At this point in time she continues on Spiriva, Breo and Singulair.  She feels well.  She has been social distancing.  She has had her Covid vaccine but will have a booster next week.  She will have a flu shot today but at the Coumadin clinic.  There are no active complaints.  She is interested in consolidating her Spiriva and Breo and to Trelegy.  She denies any recent exacerbations.   CAT Score 07/14/2020 01/22/2020  Total CAT Score 6 9     No flowsheet data found.   No flowsheet data found.    PFT Results Latest Ref Rng & Units 03/13/2018 04/01/2016 11/29/2013  FVC-Pre L 2.21 2.22 1.49  FVC-Predicted Pre % 70 68 42  FVC-Post L - 2.33 1.60  FVC-Predicted Post % - 72 45  Pre FEV1/FVC % % 64 61 63  Post FEV1/FCV % % - 63 65  FEV1-Pre L 1.42 1.35 0.93  FEV1-Predicted Pre % 59 55 34  FEV1-Post L - 1.47 1.05  DLCO uncorrected ml/min/mmHg 24.55 19.21 -  DLCO UNC% % 91 71 -  DLCO corrected ml/min/mmHg 22.70 -  -  DLCO COR %Predicted % 84 - -  DLVA Predicted % 102 95 -  TLC L - 5.00 -  TLC % Predicted % - 93 -  RV % Predicted % - 121 -      ROS - per HPI   OV 01/19/2021  Subjective:  Patient ID: [name], female , DOB: 04-Mar-1946 , age 70 y.o. , MRN: [id number] , ADDRESS: [address] [place] [address] PCP Ria Bush, MD Patient Care Team: Ria Bush, MD as PCP - General (Family Medicine) Sanda Klein, MD as PCP - Cardiology (Cardiology) [name], MD as Consulting Physician (Gastroenterology) Kyung Rudd, MD as Consulting Physician (Radiation Oncology) Odogwu, Genevie Cheshire, MD as Consulting Physician (Hematology and Oncology) Terance Ice, MD (Inactive) as Consulting Physician (Cardiology) Brand Males, MD as Consulting Physician (Pulmonary Disease) Stark Klein, MD (General Surgery) Sanda Klein, MD as Consulting Physician (Cardiology) Shon Hough, MD as Consulting Physician (Ophthalmology) Martinique, Amy, MD as  Consulting Physician (Dermatology)  This Provider for this visit: Treatment Team:  Attending Provider: Lauraine Rinne, NP    01/19/2021 -   Chief Complaint  Patient presents with  . Follow-up    No current respiratory concerns. Has questions about 4th COVID booster.     Advanced COPD on Spiriva and Breo and Singulair Multiple lung nodules with waxing and waning quality -last CT scan July 2020   HPI [name] 75 y.o. - COPD and nodule followup. Feels well. Trelegy works better. No new issues. WEight stable. Was supposed to have followup CT for nodules from July 2020 but did not. Has upcoming neck CT. She is ok if we pair CT chest with this schedule. She wants to know if she should take 4th covid booster. Her bias is that 3 is good enough +_ she is masking and being careful and would rather wait for higher prevalence of disease or vaccine to new strain    CAT Score 07/14/2020 01/22/2020  Total  CAT Score 6 9    OV 03/03/2021  Subjective:  Patient ID: [name], female , DOB: 1946/07/20 , age 74 y.o. , MRN: [id number] , ADDRESS: [place] [id number] PCP Ria Bush, MD Patient Care Team: Ria Bush, MD as PCP - General (Family Medicine) Sanda Klein, MD as PCP - Cardiology (Cardiology) [name], MD as Consulting Physician (Gastroenterology) Kyung Rudd, MD as Consulting Physician (Radiation Oncology) Nira Retort, MD as Consulting Physician (Hematology and Oncology) Terance Ice, MD (Inactive) as Consulting Physician (Cardiology) Brand Males, MD as Consulting Physician (Pulmonary Disease) Stark Klein, MD (General Surgery) Sanda Klein, MD as Consulting Physician (Cardiology) Shon Hough, MD as Consulting Physician (Ophthalmology) Martinique, Amy, MD as Consulting Physician (Dermatology)  This Provider for this visit: Treatment Team:  Attending Provider: Brand Males, MD    03/03/2021 -   Chief Complaint  Patient presents with  . Follow-up    Pt had CT performed 4/19 and is here today to discuss the results. States breathing is stable.   Advanced COPD on Spiriva and Breo and Singulair Multiple lung nodules with waxing and waning quality -last CT scan July 2020  HPI [name] 75 y.o. -returns for follow-up to discuss CT scan results.  She continues on Trelegy and she is feeling well but she is worried about the CT scan results.  I personally visualized the CT report.  It shows increase in nodular densities that are progressive in the last 2 years.  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  No evidence of cancer or pulmonary fibrosis.  Her overall symptom burden is minimal.  She had questions about what Mycobacterium avium complex is.  We went over this.  We discussed bronchoscopy.  She prefers to do it under sedation.  She is very concerned about the fact she is on Coumadin.  She does not want  to stop this because she has a heart valve.  She wants me to talk to her cardiologist about this.  Explained to her that by not doing a biopsy.  There is some risk of bleeding particularly with scope trauma either epistaxis or in the airway but this risk would be low.  We can try to minimize the risk by going to the oral cavity or even intubation.  I have written to cardiologist Dr. Sallyanne Kuster about this.  Did outline general management plan with MAI.  CAT Score 07/14/2020 01/22/2020  Total CAT Score 6 9  CT Chest data 01/26/21   Mediastinum/Nodes: Mediastinal lymph nodes measure up to 12 mm in the low right paratracheal station, unchanged. Hilar regions are difficult to definitively evaluate without IV contrast. No axillary adenopathy. Esophagus is grossly unremarkable.  Lungs/Pleura: Biapical pleuroparenchymal scarring, right greater than left. Ill-defined peribronchovascular nodularity and peribronchovascular nodular consolidation, progressive from 04/22/2019. Largest new individual nodule measures 7 mm in the right middle lobe (5 x 8 mm, 3/97). Associated mild bronchiectasis and scattered mucoid impaction. Pleuroparenchymal scarring in the right middle and right lower lobes. No pleural fluid. Airway is unremarkable. IMPRESSION: 1. 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 2. Largest new individual nodule measures 7 mm in the right middle lobe and is strongly favored to be infectious in etiology. Non-contrast chest CT at 3-6 months is recommended. If the nodules are stable at time of repeat CT, then future CT at 18-24 months (from today's scan) is considered optional for low-risk patients, but is recommended for high-risk patients. This recommendation follows the consensus statement: Guidelines for Management of Incidental Pulmonary  Nodules Detected on CT Images: From the Fleischner Society 2017; Radiology 2017; 284:228-243. 3. Ascending aortic aneurysm, stable. Recommend annual imaging followup by CTA or MRA. This recommendation follows 2010 ACCF/AHA/AATS/ACR/ASA/SCA/SCAI/SIR/STS/SVM Guidelines for the Diagnosis and Management of Patients with Thoracic Aortic Disease. Circulation. 2010; 121: J478-G956. Aortic aneurysm NOS (ICD10-I71.9). 4.  Aortic atherosclerosis (ICD10-I70.0). 5. Enlarged pulmonic trunk, indicative pulmonary arterial hypertension.   Electronically Signed   By: Lorin Picket [name]   On: 01/27/2021 08:41      No results found.    PFT  PFT Results Latest Ref Rng & Units 03/13/2018 04/01/2016 11/29/2013  FVC-Pre L 2.21 2.22 1.49  FVC-Predicted Pre % 70 68 42  FVC-Post L - 2.33 1.60  FVC-Predicted Post % - 72 45  Pre FEV1/FVC % % 64 61 63  Post FEV1/FCV % % - 63 65  FEV1-Pre L 1.42 1.35 0.93  FEV1-Predicted Pre % 59 55 34  FEV1-Post L - 1.47 1.05  DLCO uncorrected ml/min/mmHg 24.55 19.21 -  DLCO UNC% % 91 71 -  DLCO corrected ml/min/mmHg 22.70 - -  DLCO COR %Predicted % 84 - -  DLVA Predicted % 102 95 -  TLC L - 5.00 -  TLC % Predicted % - 93 -  RV % Predicted % - 121 -       has a past medical history of Acute cystitis without hematuria (12/22/2015), Anemia, Arthritis, CAP (community acquired pneumonia) (02/08/2016), CAP (community acquired pneumonia) (02/08/2016), Cataracts, bilateral, Chronic cholecystitis with calculus (01/21/2015), Chronic diastolic heart failure ([place]) (09/19/2014), Chronic insomnia, Constipation, COPD (chronic obstructive pulmonary disease) ([place]), DDD (degenerative disc disease), Dyspnea, Essential hypertension, GERD (gastroesophageal reflux disease), History of pneumonia (02/16/2016), History of radiation therapy (05/30/11 to 07/07/11), History of shingles, Hyperlipemia, Legally blind in left eye, as defined in [place], Lung nodule, Obesity, Osteopenia (12/2014),  Pancreatitis (11/2014), PONV (postoperative nausea and vomiting), Presence of permanent cardiac pacemaker, Rectal cancer ([place]) (04/2011), Research study patient (08/26/2016), Rheumatic heart disease mitral stenosis, S/P AVR (aortic valve replacement) (2015), S/P MVR (mitral valve replacement) (2015), Seizures ([place]), Sepsis secondary to UTI ([place]) (08/22/2017), Severe aortic valve stenosis  AVR 3/15 (10/01/2013), Sleep apnea, Small bowel obstruction, partial ([place]) (08/2013), and Vitamin B12 deficiency (02/28/2015).   reports that she quit smoking about 46 years ago. Her smoking use included cigarettes. She has a 15.00 pack-year smoking history. She has never  used smokeless tobacco.  Past Surgical History:  Procedure Laterality Date  . ANTERIOR LAT LUMBAR FUSION Left 07/17/2018   Procedure: Lumbar Two-Three Lumbar Three-Four Anterolateral decompression/interbody fusion with lateral plate fixation/Infuse;  Surgeon: Kristeen Miss, MD;  Location: [facility];  Service: Neurosurgery;  Laterality: Left;  Lumbar Two-Three Lumbar Three-Four Anterolateral decompression/interbody fusion with lateral plate fixation/Infuse  . AORTIC VALVE REPLACEMENT N/A 12/12/2013   Procedure: AORTIC VALVE REPLACEMENT (AVR);  Surgeon: Gaye Pollack, MD; Service: Open Heart Surgery  . BACK SURGERY  2006, 2007   2006 SPACER, 2007 decompression and fusion L4/5  . BOWEL RESECTION  04/02/2012   Procedure: SMALL BOWEL RESECTION;  Surgeon: Stark Klein, MD;  Location: [facility];  Service: General;  Laterality: N/A;  . CARPAL TUNNEL RELEASE Bilateral   . CATARACT EXTRACTION W/ INTRAOCULAR LENS IMPLANT Left 10/18/2016  . CATARACT EXTRACTION W/ INTRAOCULAR LENS IMPLANT Right 12/13/2016   Dr. Kathrin Penner  . CHOLECYSTECTOMY N/A 01/21/2015   chronic cholecystitis, Stark Klein, MD  . COLON RESECTION  08/25/2011   Procedure: COLON RESECTION LAPAROSCOPIC;  Surgeon: Stark Klein, MD;  Location: [facility];  Service: General;  Laterality: N/A;  Laparoscopic  Assisted Low Anterior Resection Diverting Ostomy and onQ pain pump  . COLONOSCOPY  03/2013   1 polyp, rpt 3 yrs Ardis Hughs)  . COLONOSCOPY WITH PROPOFOL N/A 06/09/2016   patent colo-colonic anastomosis, rpt 5 yrs Ardis Hughs)  . EP IMPLANTABLE DEVICE N/A 06/16/2016   Procedure: Pacemaker Implant;  Surgeon: Will Meredith Leeds, MD;  Location: [facility];  Service: Cardiovascular;  Laterality: N/A;  . FOOT SURGERY  left foot   hammer toe  . HAND SURGERY  Bil   carpal tunnel  . ILEOSTOMY  08/25/2011  . ILEOSTOMY CLOSURE  04/02/2012   Procedure: ILEOSTOMY TAKEDOWN;  Surgeon: Stark Klein, MD;  Location: [facility];  Service: General;  Laterality: N/A;  . INTRAOPERATIVE TRANSESOPHAGEAL ECHOCARDIOGRAM N/A 12/12/2013   Procedure: INTRAOPERATIVE TRANSESOPHAGEAL ECHOCARDIOGRAM;  Surgeon: Gaye Pollack, MD;  Location: [facility];  Service: Open Heart Surgery;  Laterality: N/A;  . LEFT AND RIGHT HEART CATHETERIZATION WITH CORONARY ANGIOGRAM N/A 11/27/2013   Procedure: LEFT AND RIGHT HEART CATHETERIZATION WITH CORONARY ANGIOGRAM;  Surgeon: Sanda Klein, MD;  Location: [facility];  Service: Cardiovascular;  Laterality: N/A;  . MITRAL VALVE REPLACEMENT N/A 12/12/2013   Procedure: MITRAL VALVE (MV) REPLACEMENT OR REPAIR;  Surgeon: Gaye Pollack, MD; Service: Open Heart Surgery  . TEE WITHOUT CARDIOVERSION N/A 11/29/2013   Procedure: TRANSESOPHAGEAL ECHOCARDIOGRAM (TEE);  Surgeon: Sueanne Margarita, MD;  Location[facility];  Service: Cardiovascular;  Laterality: N/A;  . TEE WITHOUT CARDIOVERSION N/A 06/04/2019   Procedure: TRANSESOPHAGEAL ECHOCARDIOGRAM (TEE);  Surgeon: Sanda Klein, MD;  Location: [facility] ENDOSCOPY;  Service: Cardiovascular;  Laterality: N/A;  . TONSILLECTOMY      Allergies  Allergen Reactions  . Codeine Nausea Only  . Sulfa Antibiotics Nausea Only  . Sulfonamide Derivatives Nausea Only    Immunization History  Administered Date(s) Administered  . Fluad Quad(high Dose 65+) 07/11/2019,  07/14/2020  . H1N1 09/17/2008  . Influenza Split 07/18/2012  . Influenza Whole 08/10/2005, 06/24/2009, 07/11/2011  . Influenza, High Dose Seasonal PF 07/04/2016, 07/04/2017  . Influenza,inj,Quad PF,6+ Mos 07/09/2018  . Influenza-Unspecified 07/24/2013, 06/30/2014, 06/30/2015  . Moderna SARS-COV2 Booster Vaccination 08/24/2020  . Moderna Sars-Covid-2 Vaccination 02/26/2021  . PFIZER(Purple Top)SARS-COV-2 Vaccination 10/30/2019, 11/20/2019  . Pneumococcal Conjugate-13 06/02/2014  . Pneumococcal Polysaccharide-23 08/10/2005, 04/21/2008, 06/25/2013  . Td 10/11/2005  . Zoster 09/16/2014  .  Zoster Recombinat (Shingrix) 06/25/2018, 08/28/2018    Family History  Problem Relation Age of Onset  . Lung cancer Father   . Heart disease Mother   . Diabetes Mother   . Hypertension Mother   . Stroke Mother   . CAD Son 33  . Heart attack Son 60  . Heart attack Maternal Grandfather   . Breast cancer Maternal Aunt 83  . Colon cancer Neg Hx   . Esophageal cancer Neg Hx   . Stomach cancer Neg Hx   . Rectal cancer Neg Hx      Current Outpatient Medications:  .  acetaminophen (TYLENOL) 500 MG tablet, Take 2 tablets (1,000 mg total) by mouth daily., Disp: , Rfl:  .  albuterol (VENTOLIN HFA) 108 (90 Base) MCG/ACT inhaler, Inhale 2 puffs into the lungs every 6 (six) hours as needed for wheezing., Disp: 18 g, Rfl: 6 .  Biotin 1000 MCG tablet, Take 1,000 mcg by mouth daily., Disp: , Rfl:  .  Calcium Carb-Cholecalciferol 600-800 MG-UNIT TABS, Take 1 tablet by mouth every evening., Disp: , Rfl:  .  docusate sodium (COLACE) 100 MG capsule, Take 200 mg by mouth every evening., Disp: , Rfl:  .  furosemide (LASIX) 40 MG tablet, TAKE 1 TABLET(40 MG) BY MOUTH DAILY, Disp: 30 tablet, Rfl: 11 .  gabapentin (NEURONTIN) 100 MG capsule, Take 300 mg by mouth at bedtime., Disp: , Rfl:  .  GEMTESA 75 MG TABS, Take 1 tablet by mouth daily., Disp: , Rfl:  .  ipratropium-albuterol (DUONEB) 0.5-2.5 (3) MG/3ML SOLN, Take  3 mLs by nebulization every 6 (six) hours as needed., Disp: 360 mL, Rfl: 0 .  lovastatin (MEVACOR) 40 MG tablet, TAKE 1 TABLET(40 MG) BY MOUTH AT BEDTIME, Disp: 90 tablet, Rfl: 3 .  montelukast (SINGULAIR) 10 MG tablet, TAKE 1 TABLET(10 MG) BY MOUTH AT BEDTIME, Disp: 30 tablet, Rfl: 11 .  Multiple Vitamins-Minerals (PRESERVISION AREDS 2 PO), Take 2 capsules by mouth every evening. , Disp: , Rfl:  .  nitrofurantoin (MACRODANTIN) 100 MG capsule, Take 100 mg by mouth daily., Disp: , Rfl:  .  polycarbophil (FIBERCON) 625 MG tablet, Take 625 mg by mouth every evening. , Disp: , Rfl:  .  polyethylene glycol (MIRALAX / GLYCOLAX) packet, Take 17 g by mouth daily., Disp: 14 each, Rfl: 0 .  potassium chloride (KLOR-CON) 10 MEQ tablet, Take 2 tablets (20 mEq total) by mouth daily., Disp: 60 tablet, Rfl: 9 .  traMADol (ULTRAM) 50 MG tablet, TAKE 1 TABLET(50 MG) BY MOUTH TWICE DAILY AS NEEDED FOR MODERATE PAIN, Disp: 20 tablet, Rfl: 0 .  TRELEGY ELLIPTA 200-62.5-25 MCG/INH AEPB, INHALE 1 PUFF INTO THE LUNGS DAILY, Disp: 60 each, Rfl: 5 .  vitamin B-12 (CYANOCOBALAMIN) 500 MCG tablet, Take 500 mcg by mouth every other day. , Disp: , Rfl:  .  warfarin (COUMADIN) 5 MG tablet, TAKE 1 TO 2 TABLETS BY MOUTH DAILY AS DIRECTED BY COUMADIN CLINIC, Disp: 60 tablet, Rfl: 5 .  Zinc 50 MG CAPS, Take 1 capsule (50 mg total) by mouth daily., Disp: , Rfl: 0 .  amoxicillin (AMOXIL) 500 MG capsule, TAKE 4 CAPSULES BY MOUTH 30-60 MINUTES PRIOR TO APPOINTMENT (Patient not taking: Reported on 03/03/2021), Disp: 4 capsule, Rfl: 12 .  silver sulfADIAZINE (SILVADENE) 1 % cream, Apply 1 application topically daily. (Patient not taking: Reported on 03/03/2021), Disp: 50 g, Rfl: 1 .  Talc (ZEASORB EX), Apply 1 application topically daily as needed (yeast infection under stomach). Under skin  folds (abdomen) (Patient not taking: Reported on 03/03/2021), Disp: , Rfl:  .  temazepam (RESTORIL) 7.5 MG capsule, Take 1 capsule (7.5 mg total) by mouth  at bedtime as needed for sleep. (Patient not taking: Reported on 03/03/2021), Disp: 30 capsule, Rfl: 0 No current facility-administered medications for this visit.  Facility-Administered Medications Ordered in Other Visits:  .  ondansetron (ZOFRAN) 4 mg in sodium chloride 0.9 % 50 mL IVPB, 4 mg, Intravenous, Q6H PRN, Kristeen Miss, MD      Objective:   Vitals:   03/03/21 0850  BP: 120/66  Pulse: 69  SpO2: 97%  Weight: 141 lb (64 kg)  Height: _0  (1.626 m)    Estimated body mass index is 24.2 kg/m as calculated from the following:   Height as of this encounter: _1  (1.626 m).   Weight as of this encounter: 141 lb (64 kg).  _2 @  [facility] Weights   03/03/21 0850  Weight: 141 lb (64 kg)     Physical Exam  General: No distress. think Neuro: Alert and Oriented x 3. GCS 15. Speech normal Psych: Pleasant Resp:  Barrel Chest - no.  Wheeze - noi, Crackles - no, No overt respiratory distress CVS: Normal heart sounds. Murmurs - n Ext: Stigmata of Connective Tissue Disease - no HEENT: Normal upper airway. PEERL +. No post nasal drip        Assessment:       ICD-10-CM   1. Multiple lung nodules on CT  R91.8   2. Moderate COPD (chronic obstructive pulmonary disease) ([place])  J44.9        Plan:     Patient Instructions     Multiple lung nodules on CT - per radiologist these nodules are new since 2015 -> may 2019 -> waxing and waning July 2020. 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  It will be at [facility] long.  I have also written to Dr. Loletha Grayer about Coumadin.  We will try to do it without stopping Coumadin  Moderate COPD (chronic obstructive pulmonary disease) ([facility]) - stable and better with trelegy  plan -Trelegy 200 daily  - take 1 sample - alb as needed   Followup -2-3 months from now to discuss  bronchoscopy results      SIGNATURE    Dr. Brand Males, [name], F.C.C.P,  Pulmonary and Critical Care Medicine Staff Physician, [place] Director - Interstitial Lung Disease  Program  Pulmonary [place] at [place], [place], [address]  Pager: [phone], If no answer or between  15:00h - 7:00h: call [phone] Telephone: [phone]  9:18 AM 03/03/2021

## 2021-03-03 NOTE — Telephone Encounter (Signed)
Takes 6 weeks to get results from cultures and so put appt for 2-3 months. July schedule just opened up. Give her appt . Bronch wil be in June  On CT:  Largest new individual nodule measures 7 mm in the right middle lobe (5 x 8 mm, 3/97).  -> the odds of this being cancer is super low. We need to keep an eye on this though. Right now too small to biopsy. This could be MAC or something non specific. Plan is to get 1 bronch now for ruling out MAC. If it keeps growing then will need biiopsy  Also, Dr C  Was ok for her comiing off coumadin but would need lovenox injection bridget that I recommend  Of note, noticed she is on macrobid . Can she come off it? IT has a risk to inflame lungs  My apologies if I did not come back in  Let me know. If she still has questions, can call her

## 2021-03-03 NOTE — Patient Instructions (Addendum)
    Multiple lung nodules on CT - per radiologist these nodules are new since 2015 -> may 2019 -> waxing and waning July 2020. 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  It will be at [facility] long.  I have also written to Dr. Loletha Grayer about Coumadin.  We will try to do it without stopping Coumadin  Moderate COPD (chronic obstructive pulmonary disease) (HCC) - stable and better with trelegy  plan -Trelegy 200 daily  - take 1 sample - alb as needed   Followup -2-3 months from now to discuss bronchoscopy results

## 2021-03-04 ENCOUNTER — Ambulatory Visit (INDEPENDENT_AMBULATORY_CARE_PROVIDER_SITE_OTHER): Payer: Medicare Other

## 2021-03-04 ENCOUNTER — Other Ambulatory Visit: Payer: Self-pay

## 2021-03-04 DIAGNOSIS — Z7901 Long term (current) use of anticoagulants: Secondary | ICD-10-CM

## 2021-03-04 DIAGNOSIS — Z5181 Encounter for therapeutic drug level monitoring: Secondary | ICD-10-CM | POA: Diagnosis not present

## 2021-03-04 LAB — POCT INR: INR: 2.5 (ref 2.0–3.0)

## 2021-03-04 NOTE — Patient Instructions (Addendum)
Pre visit review using our clinic review tool, if applicable. No additional management support is needed unless otherwise documented below in the visit note.  Continue weekly dose to take 5mg  daily except take 2.5 mg on Sundays and Wednesdays. Recheck in 5 weeks

## 2021-03-04 NOTE — Telephone Encounter (Signed)
Received a message from patient asking what Macrobid is. After explaining to her what it is, she stated that she is wiling to stop the medication. She wants to know how many days before her bronch does she need to stop the Macrobid.

## 2021-03-04 NOTE — Telephone Encounter (Signed)
I do not recommend Macrobid or nitrofurantoin in a patient with lung disease.  She should talk to urology or primary care and get an alternative.  I do not think she should take it at all.  She should stop it straightaway.  Decision on bronchoscopy is independent of Macrobid/nitrofurantoin

## 2021-03-05 NOTE — Telephone Encounter (Signed)
Patient sent the following response:   "I followed up with my urologist.  He has changed the antibiotic to cephalexin 250 mg.  Please let me know if this one is ok.  Thank you"  MR, please advise.

## 2021-03-05 NOTE — Telephone Encounter (Signed)
Called and spoke with patient. Let them know their Bronch is scheduled for 03/24/21 with Dr. [name] at [place] at [facility].  Patient was instructed to arrive at hospital at 7am. They were instructed to bring someone with them as they will not be able to drive home from procedure. Patient instructed not to have anything to eat or drink after midnight. Patient will stay on coumadin.  Patient wants MR to know that she has stopped the Macrodantin as of 03/06/21 and isn't sure of the name of the new medication but will send a mychart message once she knows.   Patient's covid screening is scheduled at [facility] for 03/22/21 at waiting for a call back .  Patient voiced understanding, nothing further needed  Routing to MR as FYI Patient may need an afternoon case. MR please let me know what times on the 15 are the best. Patient needs to find a ride and that early in the morning she doesn't think she will be able to find anyone to bring her.   Case # [phone]

## 2021-03-08 NOTE — Progress Notes (Signed)
Agree. Thanks

## 2021-03-09 ENCOUNTER — Telehealth: Payer: Self-pay | Admitting: Internal Medicine

## 2021-03-09 NOTE — Telephone Encounter (Signed)
Yes cephalexin 250mg  per day is fine from a pulmonary toxicity perspective

## 2021-03-09 NOTE — Telephone Encounter (Signed)
I called and spoke with pt and she stated that she spoke with lauren on Friday about her bronch.  She stated that lauren was going to get back with her about seeing if she could get a later time for the bronch on 06/15.  She also wanted to know when she needs to go for her covid test?  Will forward to LR to advise. Thanks

## 2021-03-10 NOTE — Telephone Encounter (Signed)
I have reviewed patients current dosing of Coumadin and have planned accordingly for her procedure on 6/15 based on protocol. Plan is as follows:  6/9: Check INR  6/10: Take last dose of Coumadin (5 mg) 6/11: NO Coumadin OR Lovenox 6/12: Start Lovenox (60 mg Daily)  6/13: Lovenox (60 mg)  6/14: AM Dose of Lovenox (60 mg) ONLY. NO PM                         Lovenox  6/15: Day of procedure. NO Lovenox.  6/16: Lovenox (60 mg) & Coumadin (7.5 mg) 6/17: Lovenox (60 mg) & Coumadin (7.5 mg)  6/18: Lovenox (60 mg) & Coumadin (7.5 mg) 6/19: Lovenox (60 mg) & Coumadin (5 mg) 6/20: Re-check INR first thing in AM. Hold Lovenox and          Coumadin until INR is re-checked.

## 2021-03-10 NOTE — Telephone Encounter (Signed)
Patient wants to have procedure later in the day due to transportation issues. She is now scheduled for 2:15 on 03/24/21 with Dr. [name].  ATC patient to let her know this new information unable to reach left message to call back.   Called [facility] to get her tested for covid on 03/22/21 [phone] had to leave message.  Will route to Dr. [name] as Juluis Rainier

## 2021-03-10 NOTE — Telephone Encounter (Signed)
Called spoke with patient.  Let her know the new time.  I let her know I would call her once I heard back from [facility] testing place for pre procedure.  I let her know Dr. [name] did not want to stop the coumadin.  Nothing further needed at this time.

## 2021-03-10 NOTE — Telephone Encounter (Signed)
Closing this message please see previous open phone message

## 2021-03-11 NOTE — Telephone Encounter (Signed)
Patient sent email regarding bronch and coumadin    I am sorry to bother you with another question; however, I have a concern.  Regarding my upcoming bronchoscopy, everything I read about the procedure, etc, says blood thinners should be stopped prior to the procedure.  When Dr. [name] and I talked, he said that as well as was going to follow up with Dr.. "C".   I apologize if he did and told me what the conversation yield.  When I spoke with the young lady yesterday confirming my appointment for the procedure, she advised there was no need to hold the blood thinners.  I just want to be sure as I tend to bleed very easily if I just bump my arm.  Thank you for your time.  [name]   Sending to Dr. [name] for further recommendations

## 2021-03-12 MED ORDER — ENOXAPARIN SODIUM 60 MG/0.6ML IJ SOSY: 60.0000 mg | PREFILLED_SYRINGE | Freq: Two times a day (BID) | INTRAMUSCULAR | 0 refills | Status: DC

## 2021-03-12 NOTE — Telephone Encounter (Signed)
New dosing schedule for lovenox and coumadin.   6/10: Check INR, take last dose of coumadin (5mg ) 6/11: No coumadin, no lovenox 6/12: No coumadin, lovenox Q12H 6/13: No coumadin, lovenox Q12H 6/14: No coumadin, lovenox in the AM only 6/15: Day of surgery, no coumadin, no lovenox 6/16: Take 7.5mg  coumadin, lovenox Q12H 6/17:  Take 7.5mg  coumadin, lovenox Q12 H 6/18:  Take 7.5mg  coumadin, lovenox Q12H 6/19: Take 5mg  coumadin, lovenox Q12H 6/20: Recheck INR in AM, hold lovenox and hold coumadin until INR is checked  Lovenox will be 60mg  and pt will need 14 syringes.  Contacted pt and advised she needs apt for 6/10 to discuss dosing and bridge. She would like lovenox sent to [facility] in her chart already. Placed on scheduled.

## 2021-03-12 NOTE — Addendum Note (Signed)
Addended by: Randall An on: 03/12/2021 05:48 PM   Modules accepted: Orders

## 2021-03-12 NOTE — Telephone Encounter (Signed)
Normal kidney function.  Wt = 64kg If decide to stop coumadin, agree with lovenox 60mg  BID bridging on days she is on lovenox (6/12-13, 6/16-19) as per below.

## 2021-03-12 NOTE — Telephone Encounter (Signed)
   disussed wth patient - that BAL bronch carries very low risk for bleeding with epistaxis being a concern. However, to further reduce this risk we are doing lovenox bridge  Current focus is just BAL  She wants to make sure sedaton is good so there is no gag. We dicussed moderate sedation'   Closing encounter. Nother futher needed

## 2021-03-12 NOTE — Telephone Encounter (Signed)
UPDATE: Take Lovenox 60 mg BID

## 2021-03-16 ENCOUNTER — Other Ambulatory Visit: Payer: Self-pay | Admitting: Family Medicine

## 2021-03-16 NOTE — Telephone Encounter (Signed)
Last filled 03-01-21 #20 Last OV 12-08-20 Next OV 03-19-21 [facility] [address]

## 2021-03-16 NOTE — Telephone Encounter (Signed)
ERx 

## 2021-03-19 ENCOUNTER — Encounter (HOSPITAL_COMMUNITY): Payer: Self-pay | Admitting: Internal Medicine

## 2021-03-19 ENCOUNTER — Ambulatory Visit (INDEPENDENT_AMBULATORY_CARE_PROVIDER_SITE_OTHER): Payer: Medicare Other

## 2021-03-19 ENCOUNTER — Encounter: Payer: Self-pay | Admitting: Family Medicine

## 2021-03-19 ENCOUNTER — Ambulatory Visit: Payer: Medicare Other

## 2021-03-19 ENCOUNTER — Ambulatory Visit (INDEPENDENT_AMBULATORY_CARE_PROVIDER_SITE_OTHER)
Admission: RE | Admit: 2021-03-19 | Discharge: 2021-03-19 | Disposition: A | Discharge status: Home / Self Care | Payer: Medicare Other | Source: Ambulatory Visit | Attending: Family Medicine | Admitting: Family Medicine

## 2021-03-19 ENCOUNTER — Other Ambulatory Visit: Payer: Self-pay

## 2021-03-19 ENCOUNTER — Ambulatory Visit (INDEPENDENT_AMBULATORY_CARE_PROVIDER_SITE_OTHER): Payer: Medicare Other | Admitting: Family Medicine

## 2021-03-19 VITALS — BP 120/70 | HR 78 | Temp 98.5°F | Ht 64.0 in | Wt 141.2 lb

## 2021-03-19 DIAGNOSIS — W19XXXA Unspecified fall, initial encounter: Secondary | ICD-10-CM | POA: Diagnosis not present

## 2021-03-19 DIAGNOSIS — M8580 Other specified disorders of bone density and structure, unspecified site: Secondary | ICD-10-CM | POA: Diagnosis not present

## 2021-03-19 DIAGNOSIS — Z7901 Long term (current) use of anticoagulants: Secondary | ICD-10-CM

## 2021-03-19 DIAGNOSIS — M25552 Pain in left hip: Secondary | ICD-10-CM

## 2021-03-19 LAB — POCT INR: INR: 1.8 — AB (ref 2.0–3.0)

## 2021-03-19 NOTE — Progress Notes (Signed)
Attempted to obtain medical history via telephone, unable to reach at this time. I left a voicemail to return pre surgical testing department's phone call.  

## 2021-03-19 NOTE — Patient Instructions (Addendum)
Pre visit review using our clinic review tool, if applicable. No additional management support is needed unless otherwise documented below in the visit note.    6/10: Check INR, take last dose of coumadin (7.5mg ) 6/11: No coumadin, no lovenox 6/12: No coumadin, lovenox Q12H 6/13: No coumadin, lovenox Q12H 6/14: No coumadin, lovenox in the AM only 6/15: Day of surgery, no coumadin, no lovenox 6/16: Take 7.5mg  coumadin, lovenox Q12H 6/17:  Take 7.5mg  coumadin, lovenox Q12 H 6/18:  Take 7.5mg  coumadin, lovenox Q12H 6/19: Take 5mg  coumadin, lovenox Q12H 6/20: Recheck INR in AM, hold lovenox and hold coumadin until INR is checked

## 2021-03-19 NOTE — Assessment & Plan Note (Addendum)
Ongoing L lateral/posterior hip/buttock pain since fall last week where she landed on L hip. Doubt fracture. In osteopenia hx, check L hip films. Discussed tylenol dosing.  Several falls this year. Will refer to PT balance training/fall prevention. She requests at [facility].

## 2021-03-19 NOTE — Progress Notes (Addendum)
Patient ID: [name], female    DOB: 06-09-1946, 75 y.o.   MRN: [id number]  This visit was conducted in person.  BP 120/70   Pulse 78   Temp 98.5 F (36.9 C) (Temporal)   Ht 5\' 4"  (1.626 m)   Wt 141 lb 3 oz (64 kg)   SpO2 95%   BMI 24.23 kg/m    CC: fall at home Subjective:   HPI: [name] is a 75 y.o. female presenting on 03/19/2021 for Fall (Pt fell on 03/11/21 on home in garage.  C/o left hip pain. )   DOI: 03/11/2021 Fell 1 wk ago while in garage trying to move 2 heavy bags of dirt. Fell backwards, rolling fall, hit car scraped back and landed on L hip onto garage floor.  First few days stiff and painful, but has been improving each day.  No shooting pain down leg, numbness/weakness of leg.  Treated pain with tramadol + tylenol 1500mg  BID + gabapentin 300mg  nightly.  Also used ice and biofreeze. Multiple falls this past year. Has not completed PT fall prevention/balance training program.      Relevant past medical, surgical, family and social history reviewed and updated as indicated. Interim medical history since our last visit reviewed. Allergies and medications reviewed and updated. Outpatient Medications Prior to Visit  Medication Sig Dispense Refill   acetaminophen (TYLENOL) 500 MG tablet Take 2 tablets (1,000 mg total) by mouth daily. (Patient taking differently: Take 1,000 mg by mouth every 6 (six) hours as needed for moderate pain.)     albuterol (VENTOLIN HFA) 108 (90 Base) MCG/ACT inhaler Inhale 2 puffs into the lungs every 6 (six) hours as needed for wheezing. 18 g 6   amoxicillin (AMOXIL) 500 MG capsule TAKE 4 CAPSULES BY MOUTH 30-60 MINUTES PRIOR TO APPOINTMENT (Patient taking differently: Take 2,000 mg by mouth See admin instructions. Take 2000 mg 1 hour prior to dental work) 4 capsule 12   Biotin 1000 MCG tablet Take 1,000 mcg by mouth daily.     calcium carbonate (OSCAL) 1500 (600 Ca) MG TABS tablet Take 600 mg of elemental calcium by  mouth daily.     carboxymethylcellulose (REFRESH PLUS) 0.5 % SOLN Place 1 drop into both eyes 3 (three) times daily as needed (dry eyes).     cephALEXin (KEFLEX) 250 MG capsule Take 250 mg by mouth daily.     Cholecalciferol (VITAMIN D) 50 MCG (2000 UT) tablet Take 2,000 Units by mouth daily.     docusate sodium (COLACE) 100 MG capsule Take 200 mg by mouth every evening.     enoxaparin (LOVENOX) 60 MG/0.6ML injection Inject 0.6 mLs (60 mg total) into the skin every 12 (twelve) hours for 7 days. As directed by coumadin clinic. 8.4 mL 0   furosemide (LASIX) 40 MG tablet TAKE 1 TABLET(40 MG) BY MOUTH DAILY (Patient taking differently: Take 40 mg by mouth daily.) 30 tablet 11   gabapentin (NEURONTIN) 300 MG capsule Take 300 mg by mouth at bedtime.     GEMTESA 75 MG TABS Take 75 mg by mouth daily.     ipratropium-albuterol (DUONEB) 0.5-2.5 (3) MG/3ML SOLN Take 3 mLs by nebulization every 6 (six) hours as needed. (Patient taking differently: Take 3 mLs by nebulization every 6 (six) hours as needed (shortness of breath).) 360 mL 0   lovastatin (MEVACOR) 40 MG tablet TAKE 1 TABLET(40 MG) BY MOUTH AT BEDTIME (Patient taking differently: Take 40 mg by mouth at bedtime.) 90  tablet 3   montelukast (SINGULAIR) 10 MG tablet TAKE 1 TABLET(10 MG) BY MOUTH AT BEDTIME (Patient taking differently: Take 10 mg by mouth at bedtime.) 30 tablet 11   Multiple Vitamins-Minerals (PRESERVISION AREDS 2 PO) Take 2 capsules by mouth every evening.      polyethylene glycol (MIRALAX / GLYCOLAX) packet Take 17 g by mouth daily. (Patient taking differently: Take 17 g by mouth daily as needed for moderate constipation.) 14 each 0   potassium chloride (KLOR-CON) 10 MEQ tablet Take 2 tablets (20 mEq total) by mouth daily. 60 tablet 9   PSYLLIUM PO Take 1 capsule by mouth daily.     silver sulfADIAZINE (SILVADENE) 1 % cream Apply 1 application topically daily. (Patient taking differently: Apply 1 application topically daily as needed  (wound care).) 50 g 1   traMADol (ULTRAM) 50 MG tablet TAKE 1 TABLET(50 MG) BY MOUTH TWICE DAILY AS NEEDED FOR MODERATE PAIN (Patient taking differently: Take 50 mg by mouth 2 (two) times daily as needed for moderate pain.) 20 tablet 0   TRELEGY ELLIPTA 200-62.5-25 MCG/INH AEPB INHALE 1 PUFF INTO THE LUNGS DAILY 60 each 5   vitamin B-12 (CYANOCOBALAMIN) 500 MCG tablet Take 500 mcg by mouth every other day.      warfarin (COUMADIN) 5 MG tablet TAKE 1 TO 2 TABLETS BY MOUTH DAILY AS DIRECTED BY COUMADIN CLINIC (Patient taking differently: Take 2.5-5 mg by mouth See admin instructions. Take 2.5 mg on Sun and Wed. Take 5 mg on Sat, Mon, Tues, Thurs, and Fri) 60 tablet 5   Zinc 50 MG CAPS Take 1 capsule (50 mg total) by mouth daily.  0   Facility-Administered Medications Prior to Visit  Medication Dose Route Frequency Provider Last Rate Last Admin   ondansetron (ZOFRAN) 4 mg in sodium chloride 0.9 % 50 mL IVPB  4 mg Intravenous Q6H PRN Kristeen Miss, MD         Per HPI unless specifically indicated in ROS section below Review of Systems Objective:  BP 120/70   Pulse 78   Temp 98.5 F (36.9 C) (Temporal)   Ht 5\' 4"  (1.626 m)   Wt 141 lb 3 oz (64 kg)   SpO2 95%   BMI 24.23 kg/m   Wt Readings from Last 3 Encounters:  03/19/21 141 lb 3 oz (64 kg)  03/03/21 141 lb (64 kg)  03/01/21 139 lb (63 kg)      Physical Exam Vitals and nursing note reviewed.  Constitutional:      Appearance: Normal appearance. She is not ill-appearing.  Musculoskeletal:        General: Tenderness present.     Right lower leg: No edema.     Left lower leg: No edema.     Comments:  No significant pain midline spine No lumbar paraspinous mm tenderness + pain with seated SLR on left. No pain with int/ext rotation at hip. Discomfort to left lower buttock into L lateral hip at GTB. No pain at sciatic notch  Skin:    General: Skin is warm and dry.     Findings: No rash.     Comments:  Healing abrasion to left  lower back  No significant bruising of lower back or left hip  Neurological:     Mental Status: She is alert.  Psychiatric:        Mood and Affect: Mood normal.        Behavior: Behavior normal.      Results for orders placed  or performed in visit on 03/04/21  POCT INR  Result Value Ref Range   INR 2.5 2.0 - 3.0   *Note: Due to a large number of results and/or encounters for the requested time period, some results have not been displayed. A complete set of results can be found in Results Review.   Assessment & Plan:  This visit occurred during the SARS-CoV-2 public health emergency.  Safety protocols were in place, including screening questions prior to the visit, additional usage of staff PPE, and extensive cleaning of exam room while observing appropriate contact time as indicated for disinfecting solutions.   Problem List Items Addressed This Visit     Osteopenia   Relevant Orders   Ambulatory referral to Physical Therapy   Anticoagulant long-term use   Relevant Orders   Ambulatory referral to Physical Therapy   Left hip pain - Primary    Ongoing L lateral/posterior hip/buttock pain since fall last week where she landed on L hip. Doubt fracture. In osteopenia hx, check L hip films. Discussed tylenol dosing.  Several falls this year. Will refer to PT balance training/fall prevention. She requests at [facility]        Relevant Orders   DG Hip Unilat W OR W/O Pelvis 2-3 Views Left   Ambulatory referral to Physical Therapy   Other Visit Diagnoses     Injury due to fall, initial encounter       Relevant Orders   Ambulatory referral to Physical Therapy        No orders of the defined types were placed in this encounter.  Orders Placed This Encounter  Procedures   DG Hip Unilat W OR W/O Pelvis 2-3 Views Left    Standing Status:   Future    Number of Occurrences:   1    Standing Expiration Date:   03/19/2022    Order Specific Question:   Reason for Exam (SYMPTOM  OR  DIAGNOSIS REQUIRED)    Answer:   L lateral hip pain after fall 03/11/2021    Order Specific Question:   Preferred imaging location?    Answer:   [place]   Ambulatory referral to Physical Therapy    Referral Priority:   Routine    Referral Type:   Physical Medicine    Referral Reason:   Specialty Services Required    Requested Specialty:   Physical Therapy    Number of Visits Requested:   1    Patient Instructions  Left hip xray today Let us know if not continuing to improve each day. We will refer you for outpatient physical therapy fall prevention program.  See Larene Beach for coumadin check today.    Follow up plan: Return if symptoms worsen or fail to improve.  Ria Bush, MD

## 2021-03-19 NOTE — Patient Instructions (Addendum)
Left hip xray today Let us know if not continuing to improve each day. We will refer you for outpatient physical therapy fall prevention program.  See Larene Beach for coumadin check today.

## 2021-03-22 ENCOUNTER — Other Ambulatory Visit: Payer: Self-pay

## 2021-03-22 ENCOUNTER — Other Ambulatory Visit
Admission: RE | Admit: 2021-03-22 | Discharge: 2021-03-22 | Disposition: A | Discharge status: Home / Self Care | Payer: Medicare Other | Source: Ambulatory Visit | Attending: Internal Medicine | Admitting: Internal Medicine

## 2021-03-22 DIAGNOSIS — Z01812 Encounter for preprocedural laboratory examination: Secondary | ICD-10-CM | POA: Diagnosis present

## 2021-03-22 DIAGNOSIS — Z20822 Contact with and (suspected) exposure to covid-19: Secondary | ICD-10-CM | POA: Insufficient documentation

## 2021-03-22 LAB — SARS CORONAVIRUS 2 (TAT 6-24 HRS): SARS Coronavirus 2: NEGATIVE

## 2021-03-23 ENCOUNTER — Telehealth: Payer: Self-pay | Admitting: Cardiovascular Disease

## 2021-03-23 NOTE — Telephone Encounter (Signed)
Order extended.

## 2021-03-23 NOTE — Telephone Encounter (Signed)
Please extend Echo orders past 03/25/21. The patient could not keep her appt and the test could not be re-scheduled because the orders were expired

## 2021-03-24 ENCOUNTER — Encounter (HOSPITAL_COMMUNITY): Payer: Self-pay | Admitting: Internal Medicine

## 2021-03-24 ENCOUNTER — Encounter (HOSPITAL_COMMUNITY)
Admission: RE | Disposition: A | Discharge status: Home / Self Care | Payer: Self-pay | Source: Ambulatory Visit | Attending: Internal Medicine

## 2021-03-24 ENCOUNTER — Ambulatory Visit (HOSPITAL_COMMUNITY)
Admission: RE | Admit: 2021-03-24 | Discharge: 2021-03-24 | Disposition: A | Discharge status: Home / Self Care | Payer: Medicare Other | Source: Ambulatory Visit | Attending: Internal Medicine | Admitting: Internal Medicine

## 2021-03-24 ENCOUNTER — Other Ambulatory Visit: Payer: Self-pay

## 2021-03-24 DIAGNOSIS — Z8669 Personal history of other diseases of the nervous system and sense organs: Secondary | ICD-10-CM | POA: Diagnosis not present

## 2021-03-24 DIAGNOSIS — Z8619 Personal history of other infectious and parasitic diseases: Secondary | ICD-10-CM | POA: Diagnosis not present

## 2021-03-24 DIAGNOSIS — Z9049 Acquired absence of other specified parts of digestive tract: Secondary | ICD-10-CM | POA: Diagnosis not present

## 2021-03-24 DIAGNOSIS — Z952 Presence of prosthetic heart valve: Secondary | ICD-10-CM | POA: Diagnosis not present

## 2021-03-24 DIAGNOSIS — Z8249 Family history of ischemic heart disease and other diseases of the circulatory system: Secondary | ICD-10-CM | POA: Insufficient documentation

## 2021-03-24 DIAGNOSIS — R918 Other nonspecific abnormal finding of lung field: Secondary | ICD-10-CM

## 2021-03-24 DIAGNOSIS — Z885 Allergy status to narcotic agent status: Secondary | ICD-10-CM | POA: Insufficient documentation

## 2021-03-24 DIAGNOSIS — Z803 Family history of malignant neoplasm of breast: Secondary | ICD-10-CM | POA: Diagnosis not present

## 2021-03-24 DIAGNOSIS — Z950 Presence of cardiac pacemaker: Secondary | ICD-10-CM | POA: Diagnosis not present

## 2021-03-24 DIAGNOSIS — Z85048 Personal history of other malignant neoplasm of rectum, rectosigmoid junction, and anus: Secondary | ICD-10-CM | POA: Insufficient documentation

## 2021-03-24 DIAGNOSIS — J449 Chronic obstructive pulmonary disease, unspecified: Secondary | ICD-10-CM | POA: Diagnosis present

## 2021-03-24 DIAGNOSIS — R911 Solitary pulmonary nodule: Secondary | ICD-10-CM | POA: Insufficient documentation

## 2021-03-24 DIAGNOSIS — Z28311 Partially vaccinated for covid-19: Secondary | ICD-10-CM | POA: Insufficient documentation

## 2021-03-24 DIAGNOSIS — Z87891 Personal history of nicotine dependence: Secondary | ICD-10-CM | POA: Insufficient documentation

## 2021-03-24 DIAGNOSIS — Z8719 Personal history of other diseases of the digestive system: Secondary | ICD-10-CM | POA: Insufficient documentation

## 2021-03-24 DIAGNOSIS — E785 Hyperlipidemia, unspecified: Secondary | ICD-10-CM | POA: Insufficient documentation

## 2021-03-24 DIAGNOSIS — Z801 Family history of malignant neoplasm of trachea, bronchus and lung: Secondary | ICD-10-CM | POA: Insufficient documentation

## 2021-03-24 DIAGNOSIS — Z7901 Long term (current) use of anticoagulants: Secondary | ICD-10-CM | POA: Diagnosis not present

## 2021-03-24 DIAGNOSIS — Z882 Allergy status to sulfonamides status: Secondary | ICD-10-CM | POA: Diagnosis not present

## 2021-03-24 DIAGNOSIS — H5440 Blindness, one eye, unspecified eye: Secondary | ICD-10-CM | POA: Diagnosis not present

## 2021-03-24 DIAGNOSIS — I5032 Chronic diastolic (congestive) heart failure: Secondary | ICD-10-CM | POA: Insufficient documentation

## 2021-03-24 DIAGNOSIS — Z79899 Other long term (current) drug therapy: Secondary | ICD-10-CM | POA: Diagnosis not present

## 2021-03-24 DIAGNOSIS — I110 Hypertensive heart disease with heart failure: Secondary | ICD-10-CM | POA: Diagnosis not present

## 2021-03-24 HISTORY — PX: VIDEO BRONCHOSCOPY: SHX5072

## 2021-03-24 HISTORY — PX: BRONCHIAL WASHINGS: SHX5105

## 2021-03-24 LAB — BODY FLUID CELL COUNT WITH DIFFERENTIAL
Eos, Fluid: 0 %
Eos, Fluid: 0 %
Lymphs, Fluid: 5 %
Lymphs, Fluid: 1 %
Monocyte-Macrophage-Serous Fluid: 75 % (ref 50–90)
Monocyte-Macrophage-Serous Fluid: 35 % — ABNORMAL LOW (ref 50–90)
Neutrophil Count, Fluid: 20 % (ref 0–25)
Neutrophil Count, Fluid: 64 % — ABNORMAL HIGH (ref 0–25)
Total Nucleated Cell Count, Fluid: 220 cu mm (ref 0–1000)

## 2021-03-24 SURGERY — VIDEO BRONCHOSCOPY WITHOUT FLUORO
Anesthesia: Moderate Sedation

## 2021-03-24 MED ORDER — LACTATED RINGERS IV SOLN: INTRAVENOUS | Status: DC

## 2021-03-24 MED ORDER — LIDOCAINE HCL 1 % IJ SOLN
INTRAMUSCULAR | Status: DC | PRN
  Administered 2021-03-24: 6 mL

## 2021-03-24 MED ORDER — LIDOCAINE HCL (PF) 4 % IJ SOLN
INTRAMUSCULAR | Status: AC
  Filled 2021-03-24: qty 5

## 2021-03-24 MED ORDER — MIDAZOLAM HCL (PF) 5 MG/ML IJ SOLN
INTRAMUSCULAR | Status: AC
  Filled 2021-03-24: qty 2

## 2021-03-24 MED ORDER — MIDAZOLAM HCL (PF) 10 MG/2ML IJ SOLN
INTRAMUSCULAR | Status: DC | PRN
  Administered 2021-03-24: 2 mg via INTRAVENOUS
  Administered 2021-03-24: 1 mg via INTRAVENOUS

## 2021-03-24 MED ORDER — FENTANYL CITRATE (PF) 100 MCG/2ML IJ SOLN
INTRAMUSCULAR | Status: AC
  Filled 2021-03-24: qty 4

## 2021-03-24 MED ORDER — PHENYLEPHRINE HCL 0.25 % NA SOLN
NASAL | Status: AC
  Filled 2021-03-24: qty 15

## 2021-03-24 MED ORDER — LIDOCAINE HCL URETHRAL/MUCOSAL 2 % EX GEL
CUTANEOUS | Status: AC
  Filled 2021-03-24: qty 30

## 2021-03-24 MED ORDER — LIDOCAINE HCL 4 % EX SOLN
Freq: Once | CUTANEOUS | Status: AC
  Filled 2021-03-24: qty 50

## 2021-03-24 MED ORDER — LIDOCAINE HCL 1 % IJ SOLN
INTRAMUSCULAR | Status: AC
  Filled 2021-03-24: qty 20

## 2021-03-24 MED ORDER — LIDOCAINE HCL URETHRAL/MUCOSAL 2 % EX GEL
1.0000 "application " | Freq: Once | CUTANEOUS | Status: AC
  Administered 2021-03-24: 1 via TOPICAL

## 2021-03-24 MED ORDER — FENTANYL CITRATE (PF) 100 MCG/2ML IJ SOLN
INTRAMUSCULAR | Status: DC | PRN
  Administered 2021-03-24: 50 ug via INTRAVENOUS
  Administered 2021-03-24: 25 ug via INTRAVENOUS

## 2021-03-24 MED ORDER — PHENYLEPHRINE HCL 0.25 % NA SOLN
1.0000 | Freq: Four times a day (QID) | NASAL | Status: DC | PRN
  Administered 2021-03-24: 1 via NASAL

## 2021-03-24 MED ORDER — BUTAMBEN-TETRACAINE-BENZOCAINE 2-2-14 % EX AERO: 1.0000 | INHALATION_SPRAY | Freq: Once | CUTANEOUS | Status: DC

## 2021-03-24 NOTE — Discharge Instructions (Signed)
BRONCHOSCOPY DISCHARGE INSTRUCTIONS  - Please have someone to drive you home - Please be careful with activities for next 24 hours - You can eat 2-4 hours after getting home provided you are fully alert, able to cough, and are not nauseated or vomiting and     feel well - You are expected to have low grade fever or cough some amount of blood for next 24-48 hours; if this worsens call us - If you are very short of breath or coughing blood or chest pain or not feeling well, call us [phone] anytime or go to emergency room - For followup appointment - see below. If not there please call [phone] to make an appointment with Dr [name] or nurse practitioner   - do NOT Take anticoagulation 03/24/2021 . Resume only in afternoon of 03/25/21   Future Appointments  Date Time Provider Angelina  03/29/2021  9:00 AM [facility] COUMADIN [facility]INIC [facility] PEC  04/05/2021  9:10 AM [name], MD L[facility]-GI [facility]  04/06/2021  2:00 PM [facility] COUMADIN CLINIC [facility] PEC  04/08/2021  8:30 AM GI-BCG MM 2 GI-BCGMM GI-BREAST CE  04/14/2021  1:45 PM Brand Males, MD LBPU-PULCARE None  04/30/2021  7:10 AM [facility] DEVICE REMOTES [facility]  05/28/2021  9:00 AM Penumalli, Earlean Polka, MD GNA-GNA None  06/09/2021  8:30 AM [facility] LAB [facility] PEC  06/16/2021  9:00 AM Ria Bush, MD [facility] PEC  07/30/2021  7:10 AM [facility] DEVICE REMOTES [facility]  10/29/2021  7:10 AM [facility] DEVICE REMOTES [facility]  01/28/2022  7:10 AM [facility] DEVICE REMOTES [facility]  04/29/2022  7:10 AM [facility] DEVICE REMOTES [facility]  07/29/2022  7:10 AM [facility] DEVICE REMOTES [facility]

## 2021-03-24 NOTE — Interval H&P Note (Deleted)
03/24/2021 1:56 PM  S: H &P is < 30 days   Risks of pneumothorax, hemothorax, sedation/anesthesia complications such as cardiac or respiratory arrest or hypotension, stroke and bleeding all explained. Benefits of diagnosis but limitations of non-diagnosis also explained. Patient verbalized understanding and wished to proceed.

## 2021-03-24 NOTE — Op Note (Signed)
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 Concern for MAI  Consent:  Procedure, benefits, risks and alternatives discussed.  Questions answered.  Consent obtained.  Anesthesia:  Moderate Sedation  Location: [facility] [place]  Procedure summary:  Appropriate equipment was assembled.  The patient was brought to the procedure suite room and identified as [name] with Apr 05, 1946  Safety timeout was performed. The patient was placed supine on the  table, airway and moderate sedation administered by this operator  After giving 34m of 4% lidocaine neb the appropriate level of moderation was assured, flexible video bronchoscope was lubricated and inserted through the right nairs and  Total of 6 mL of 1% Lidocaine were administered through the bronchoscope to augment moderate sedation. Totral versed 317m Fentanyl 7522m Airway examination was yes performed bilaterally to subsegmental level.  Minimal clear secretions were noted, mucosa appeared normal and YES endobronchial lesions were identified. There were WHITE PATCHES IN THE FLOOR OF THE RML ENTRANCE, RUL ENTRANCE AND LUL ENTRANCE. tHE FIRST 2 CAME OFF WITH SCOPE TOUCH AND ENDOBRONCHIAL WIRE BUT WITH RESULTANT OOZING OF BLOOD. THE LUL ONE LOOKED LARGER WITH AN ULCERATED TOP THAT LATER THE ULCERATED TOP RESOLVED. OTHERISE NORMAL AIRAY  Bronchial alveolar lavage of the RIGHT MIDDLE LOBE AND THEN LEFT UPPER LOBE  was performed . FOR RML 40CC X 2 AND FOR LUL 40CC X1. THE OOZING WAS CONTROLLED IN RML BY SELF BUT 5CC COLD SALINE WAS ALSO INSTILLED . AFTER ENSURING  hemostasis,  the bronchoscope was withdrawn.  The RML BAL was grey The LUL BAL looked red and pink  After hemostasis was  assure, the bronchoscope was withdrawn.  The patient was recovered and then  transferred to recovery area  Post-procedure chest x-ray was not ordered.  Specimens sent: Bronchial alveolar lavage specimen of the RML and LUL  for cell count  microbiology and cytology.  Complications:  No immediate complications were noted.  Hemodynamic parameters and oxygenation remained stable throughout the procedure.  Estimated blood loss:  1cc  IMPRESSION 1. Normal airawy but has white plaque like lesions that came off in RML and RUL floor . Also in LUL not disturbed 2. RML and LUL  BAL  3. Skip lovenox or anticoagulaton 6/15/2022and start 03/25/21  Followup Future Appointments  Date Time Provider DepWagoner/20/2022  9:00 AM [facility] COU[facility]  04/05/2021  9:10 AM JacMilus BanisterD [facility]GI [facility]  04/06/2021  2:00 PM [facility] COUMADIN CLINIC [facility] PEC  04/08/2021  8:30 AM GI-BCG MM 2 GI-BCGMM GI-BREAST CE  04/14/2021  1:45 PM RamBrand MalesD L[facility]U-PULCARE None  04/30/2021  7:10 AM [facility] DEVICE REMOTES [facility]  05/28/2021  9:00 AM Penumalli, VikEarlean PolkaD GNA-GNA None  06/09/2021  8:30 AM [facility] LAB [facility] PEC  06/16/2021  9:00 AM GutRia BushD [facility] PEC  07/30/2021  7:10 AM [facility] DEVICE REMOTES [facility]  10/29/2021  7:10 AM [facility] DEVICE REMOTES [facility]  01/28/2022  7:10 AM [facility] DEVICE REMOTES [facility]  04/29/2022  7:10 AM [facility] DEVICE REMOTES [facility]  07/29/2022  7:10 AM [facility] DEVICE REMOTES [facility]     Dr. MurBrand Males.D., F.[facility] Center[facility] Pulmonary and Critical Care Medicine Staff Physician [place]  [facility] Pager: [phone], If no answer or between  15:00h - 7:00h: call [phone]  03/24/2021 2:49 PM

## 2021-03-24 NOTE — Interval H&P Note (Signed)
Staff Note  Presents for bronch with bAL. No interim complaints. She feels apprehensive aout proedue due to general anxiety . NPO confiormed. Has not taken meds this morning. No new complaitns since last visit 5/25/222  Object General Appearance:    Alert, cooperative, no distress, appears stated age - yes , sitting on - stretecher  Head:    Normocephalic, without obvious abnormality, atraumatic  Eyes:    PERRL, conjunctiva/corneas clear,  Ears:    Normal TM's and external ear canals, both ears  Nose:   Nares normal, septum midline, mucosa normal, no drainage    or sinus tenderness. OXYGEN ON no @ ra  Throat:   Lips, mucosa, and tongue normal; teeth and gums normal. Cyanosis on lips - no  Neck:   Supple, symmetrical, trachea midline, no adenopathy;    thyroid:  no enlargement/tenderness/nodules; no carotid   bruit or JVD  Back:     Symmetric, no curvature, ROM normal, no CVA tenderness  Lungs:     Distress - no , Wheeze no, Barrell Chest - no, Purse lip breathing - no, Crackles - no   Chest Wall:    No tenderness or deformity. Scars in chest no   Heart:    Regular rate and rhythm, S1 and S2 normal, no murmur, rub   or gallop  Breast Exam:    NOT DONE  Abdomen:     Soft, non-tender, bowel sounds active all four quadrants,    no masses, no organomegaly  Genitalia:   NOT DONE  Rectal:   NOT DONE  Extremities:   Extremities normal, atraumatic, Clubbing - no, Edema - no  Pulses:   2+ and symmetric all extremities  Skin:   Stigmata of Connective Tissue Disease - no  Lymph nodes:   Cervical, supraclavicular, and axillary nodes normal  Psychiatric:  Neurologic:   pleasant CNII-XII intact, normal strength, sensation  throughout    A Pulmonary finitlrates COPD  Plan Risks of pneumothorax, hemothorax, sedation/anesthesia complications such as cardiac or respiratory arrest or hypotension, stroke and bleeding all explained. Benefits of diagnosis but limitations of non-diagnosis also  explained. Patient verbalized understanding and wished to proceed.   BAL rule out MAC     SIGNATURE    Dr. Brand Males, [name], F.C.C.P,  Pulmonary and Critical Care Medicine Staff Physician, [place] Director - Interstitial Lung Disease  Program  Pulmonary [facility] [place], [place], [address]  Pager: [phone], If no answer  OR between  19:00-7:00h: page [phone] Telephone (clinical office): [phone] Telephone (research): [phone]  2:02 PM 03/24/2021

## 2021-03-25 ENCOUNTER — Telehealth: Payer: Self-pay | Admitting: Family Medicine

## 2021-03-25 ENCOUNTER — Other Ambulatory Visit (HOSPITAL_COMMUNITY): Payer: Medicare Other

## 2021-03-25 ENCOUNTER — Encounter (HOSPITAL_COMMUNITY): Payer: Self-pay | Admitting: Internal Medicine

## 2021-03-25 LAB — CYTOLOGY - NON PAP

## 2021-03-25 LAB — PNEUMOCYSTIS JIROVECI SMEAR BY DFA
Pneumocystis jiroveci Ag: NEGATIVE
Pneumocystis jiroveci Ag: NEGATIVE

## 2021-03-25 NOTE — Telephone Encounter (Signed)
Patient call in requesting a follow call about the referral to [facility] for Physical Therapy

## 2021-03-26 ENCOUNTER — Ambulatory Visit (HOSPITAL_COMMUNITY): Payer: Medicare Other | Attending: Cardiology

## 2021-03-26 ENCOUNTER — Other Ambulatory Visit: Payer: Self-pay

## 2021-03-26 DIAGNOSIS — I050 Rheumatic mitral stenosis: Secondary | ICD-10-CM | POA: Diagnosis present

## 2021-03-26 LAB — ECHOCARDIOGRAM COMPLETE
AR max vel: 0.87 cm2
AV Area VTI: 0.92 cm2
AV Area mean vel: 0.82 cm2
AV Mean grad: 13.6 mmHg
AV Peak grad: 24.8 mmHg
Ao pk vel: 2.49 m/s
Area-P 1/2: 1.94 cm2
MV VTI: 0.75 cm2
P 1/2 time: 381 msec
S' Lateral: 2.2 cm

## 2021-03-26 LAB — ACID FAST SMEAR (AFB, MYCOBACTERIA)
Acid Fast Smear: NEGATIVE
Acid Fast Smear: NEGATIVE

## 2021-03-26 NOTE — Telephone Encounter (Signed)
Dr. Darnell Level, have you received any ppw from [facility] for pt?

## 2021-03-26 NOTE — Telephone Encounter (Signed)
[name] called in due to she is trying to find out if the referral to [facility] for PT was sent. The nurse stated that they didn't receive anything about the referral. And she will be at home around 3 due to she having  Echo

## 2021-03-26 NOTE — Telephone Encounter (Signed)
Called [facility] and spoke with [place]. Re faxed referral For PT to [facility]. Patient advised.

## 2021-03-26 NOTE — Telephone Encounter (Signed)
I placed referral to [facility] 6/10 for PT.  It was faxed to them 6/15.  Can we call to see if it was received?

## 2021-03-27 LAB — CULTURE, RESPIRATORY W GRAM STAIN: Gram Stain: NONE SEEN

## 2021-03-28 ENCOUNTER — Other Ambulatory Visit: Payer: Self-pay | Admitting: Cardiovascular Disease

## 2021-03-28 ENCOUNTER — Other Ambulatory Visit: Payer: Self-pay | Admitting: Family Medicine

## 2021-03-29 ENCOUNTER — Telehealth: Payer: Self-pay | Admitting: Cardiovascular Disease

## 2021-03-29 ENCOUNTER — Other Ambulatory Visit: Payer: Self-pay

## 2021-03-29 ENCOUNTER — Ambulatory Visit (INDEPENDENT_AMBULATORY_CARE_PROVIDER_SITE_OTHER): Payer: Medicare Other

## 2021-03-29 ENCOUNTER — Ambulatory Visit: Payer: Medicare Other | Admitting: Gastroenterology

## 2021-03-29 DIAGNOSIS — Z7901 Long term (current) use of anticoagulants: Secondary | ICD-10-CM

## 2021-03-29 LAB — POCT INR: INR: 1.6 — AB (ref 2.0–3.0)

## 2021-03-29 LAB — MTB RIF NAA W/O CULTURE, SPUTUM

## 2021-03-29 MED ORDER — ENOXAPARIN SODIUM 60 MG/0.6ML IJ SOSY: 60.0000 mg | PREFILLED_SYRINGE | Freq: Two times a day (BID) | INTRAMUSCULAR | 0 refills | Status: DC

## 2021-03-29 MED ORDER — AMOXICILLIN 500 MG PO CAPS: ORAL_CAPSULE | ORAL | 5 refills | Status: DC

## 2021-03-29 NOTE — Patient Instructions (Signed)
INR today: 1.6, Goal 2.5-3.0  Patient is to continue q12 hour Lovenox until reaches closer to therapeutic range.   She is to take 7.5mg  today (6/20) and then return to regular dosing tomorrow (5mg  daily EXCEPT 2.5 on Wednesday) we will recheck her this Thursday.  She is to take bid Lovenox until Wednesday night and 5 more doses have been sent in as patient has 1 she will administer now.  NO Lovenox or coumadin on Thursday am until level is rechecked.  Patient verbalizes understanding of all instructions and was able to repeat back to me over the phone.

## 2021-03-29 NOTE — Telephone Encounter (Signed)
Too early  Name of Medication: Tramadol Name of Pharmacy: [facility] [address] [place] or Written Date and Quantity: 03/16/21, #20/0 Last Office Visit and Type: 6/19/7/22, 0/22, L hip pain Next Office Visit and Type: 06/16/21, AWV prt 2 Last Controlled Substance Agreement Date: none Last UDS: none

## 2021-03-29 NOTE — Telephone Encounter (Signed)
Follow Up:     Pt wants to know why her Albuterol was denied.please. she says she needs it before she goes to the dentist this week.Marland Kitchen

## 2021-03-29 NOTE — Telephone Encounter (Signed)
Spoke with patient who needs AMOXICILLIN refilled for her dentist appointment. Rx(s) sent to pharmacy electronically.

## 2021-04-01 ENCOUNTER — Other Ambulatory Visit: Payer: Self-pay | Admitting: Family Medicine

## 2021-04-01 ENCOUNTER — Other Ambulatory Visit: Payer: Self-pay

## 2021-04-01 ENCOUNTER — Ambulatory Visit (INDEPENDENT_AMBULATORY_CARE_PROVIDER_SITE_OTHER): Payer: Medicare Other

## 2021-04-01 ENCOUNTER — Other Ambulatory Visit: Payer: Medicare Other

## 2021-04-01 DIAGNOSIS — Z7901 Long term (current) use of anticoagulants: Secondary | ICD-10-CM

## 2021-04-01 LAB — POCT INR: INR: 1.9 — AB (ref 2.0–3.0)

## 2021-04-01 MED ORDER — ENOXAPARIN SODIUM 60 MG/0.6ML IJ SOSY: 60.0000 mg | PREFILLED_SYRINGE | Freq: Two times a day (BID) | INTRAMUSCULAR | 0 refills | Status: DC

## 2021-04-01 NOTE — Patient Instructions (Addendum)
NR today: 1.9 Goal 2.5-3.0  Patient is to continue q12 hour Lovenox until reaches closer to therapeutic range.   She is to take 7.5mg  today (6/23) and tomorrow (6/24) and then return to regular dosing tomorrow (5mg  daily EXCEPT 2.5 on Wednesday) we will recheck this Monday (6/27).  She is to take bid Lovenox through Sunday night.  NO Lovenox or coumadin on Monday am until level is rechecked.  Patient verbalizes understanding of all instructions and was able to repeat back to me over the phone.   Note:  patient instructed to take the lovenox tonight but she is unable to pick up this evening despite my strong warning of clot risk.  She states she will pick up in the am and start back on bid injections Friday, Saturday and Sunday.  She verbalizes understanding the risks of missing today's lovenox dosing.  Also, RX had to be transferred to the [facility] as the one patient normally gets does not have enough Lovenox in stock.  Patient has been made aware.

## 2021-04-02 ENCOUNTER — Telehealth: Payer: Self-pay | Admitting: Family Medicine

## 2021-04-02 NOTE — Telephone Encounter (Signed)
Patient called in stating that she received a call to come in on Monday at 2 to have her inr rechecked . Patient states that she can't come in till Tuesday at 2. Patient also states that she was able to her Levonox shots last night. EM

## 2021-04-02 NOTE — Telephone Encounter (Signed)
[place] Night - Client Nonclinical Telephone Record AccessNurse Client [facility] Night - Client Client Site [place] Physician Ria Bush - MD Contact Type Call Who Is Calling Patient / Member / Family / Caregiver Caller Name [name] Phone Number [phone] Patient Name [name] Patient DOB 1946/08/29 Call Type Message Only Information Provided Reason for Call Request for General Office Information Initial Comment Caller states she missed a call from [facility] to come in on Monday at 200 She cannot make it on Monday Additional Comment She cannot make in Monday at all. Disp. Time Disposition Final User 04/01/2021 8:37:26 PM General Information Provided Yes Spell, Olivia Mackie Call Closed By: Renne Crigler Transaction Date/Time: 04/01/2021 8:34:25 PM (ET)

## 2021-04-02 NOTE — Telephone Encounter (Signed)
Spoke with patient and reviewed dosing and lovenox plan with Allie Bossier, NP.    Patient cannot come before Tuesday at 2pm for her recheck.. She knows to take 5mg  on Monday and her last Lovenox will be Sunday pm.  NP in agreement will not call in more lovenox for Monday but patient is to take her 5mg .  Patient verbalized understanding.

## 2021-04-04 ENCOUNTER — Telehealth: Payer: Self-pay | Admitting: Internal Medicine

## 2021-04-04 NOTE — Progress Notes (Signed)
Agree. Thanks

## 2021-04-04 NOTE — Telephone Encounter (Signed)
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 Could you please check con meds and interactions and make sure is ok and via reply inform Raquel Sarna and myself dosing and any safety concerns  Thanks  MR  Allergies  Allergen Reactions   Codeine Nausea Only   Sulfa Antibiotics Nausea Only   Sulfonamide Derivatives Nausea Only    Current Outpatient Medications:    acetaminophen (TYLENOL) 500 MG tablet, Take 2 tablets (1,000 mg total) by mouth daily., Disp: , Rfl:    albuterol (VENTOLIN HFA) 108 (90 Base) MCG/ACT inhaler, Inhale 2 puffs into the lungs every 6 (six) hours as needed for wheezing., Disp: 18 g, Rfl: 6   amoxicillin (AMOXIL) 500 MG capsule, TAKE 4 CAPSULES BY MOUTH 30-60 MINUTES PRIOR TO APPOINTMENT, Disp: 4 capsule, Rfl: 5   Biotin 1000 MCG tablet, Take 1,000 mcg by mouth daily., Disp: , Rfl:    calcium carbonate (OSCAL) 1500 (600 Ca) MG TABS tablet, Take 600 mg of elemental calcium by mouth daily., Disp: , Rfl:    carboxymethylcellulose (REFRESH PLUS) 0.5 % SOLN, Place 1 drop into both eyes 3 (three) times daily as needed (dry eyes)., Disp: , Rfl:    cephALEXin (KEFLEX) 250 MG capsule, Take 250 mg by mouth daily., Disp: , Rfl:    Cholecalciferol (VITAMIN D) 50 MCG (2000 UT) tablet, Take 2,000 Units by mouth daily., Disp: , Rfl:    docusate sodium (COLACE) 100 MG capsule, Take 200 mg by mouth every evening., Disp: , Rfl:    enoxaparin (LOVENOX) 60 MG/0.6ML injection, Inject 0.6 mLs (60 mg total) into the skin every 12 (twelve) hours for 5 doses. As directed by coumadin clinic, Disp: 3 mL, Rfl: 0   enoxaparin (LOVENOX) 60 MG/0.6ML injection, Inject 0.6 mLs (60 mg total) into the skin every 12 (twelve) hours for 7 doses., Disp: 4.2 mL, Rfl: 0   furosemide (LASIX) 40 MG tablet, TAKE 1 TABLET(40 MG) BY MOUTH DAILY, Disp: 30 tablet, Rfl: 11   gabapentin (NEURONTIN) 300 MG capsule, Take 300 mg by mouth at bedtime., Disp: , Rfl:     GEMTESA 75 MG TABS, Take 75 mg by mouth daily., Disp: , Rfl:    ipratropium-albuterol (DUONEB) 0.5-2.5 (3) MG/3ML SOLN, Take 3 mLs by nebulization every 6 (six) hours as needed., Disp: 360 mL, Rfl: 0   lovastatin (MEVACOR) 40 MG tablet, TAKE 1 TABLET(40 MG) BY MOUTH AT BEDTIME, Disp: 90 tablet, Rfl: 3   montelukast (SINGULAIR) 10 MG tablet, TAKE 1 TABLET(10 MG) BY MOUTH AT BEDTIME, Disp: 30 tablet, Rfl: 11   Multiple Vitamins-Minerals (PRESERVISION AREDS 2 PO), Take 2 capsules by mouth every evening. , Disp: , Rfl:    polyethylene glycol (MIRALAX / GLYCOLAX) packet, Take 17 g by mouth daily., Disp: 14 each, Rfl: 0   potassium chloride (KLOR-CON) 10 MEQ tablet, TAKE 2 TABLETS(20 MEQ) BY MOUTH DAILY, Disp: 60 tablet, Rfl: 9   PSYLLIUM PO, Take 1 capsule by mouth daily., Disp: , Rfl:    silver sulfADIAZINE (SILVADENE) 1 % cream, Apply 1 application topically daily., Disp: 50 g, Rfl: 1   traMADol (ULTRAM) 50 MG tablet, TAKE 1 TABLET(50 MG) BY MOUTH TWICE DAILY AS NEEDED FOR MODERATE PAIN, Disp: 30 tablet, Rfl: 0   TRELEGY ELLIPTA 200-62.5-25 MCG/INH AEPB, INHALE 1 PUFF INTO THE LUNGS DAILY, Disp: 60 each, Rfl: 5   vitamin B-12 (CYANOCOBALAMIN) 500 MCG tablet, Take 500 mcg by  mouth every other day. , Disp: , Rfl:    warfarin (COUMADIN) 5 MG tablet, TAKE 1 TO 2 TABLETS BY MOUTH DAILY AS DIRECTED BY COUMADIN CLINIC, Disp: 60 tablet, Rfl: 5   Zinc 50 MG CAPS, Take 1 capsule (50 mg total) by mouth daily., Disp: , Rfl: 0 No current facility-administered medications for this visit.  Facility-Administered Medications Ordered in Other Visits:    ondansetron (ZOFRAN) 4 mg in sodium chloride 0.9 % 50 mL IVPB, 4 mg, Intravenous, Q6H PRN, Kristeen Miss, MD

## 2021-04-05 ENCOUNTER — Telehealth: Payer: Self-pay

## 2021-04-05 ENCOUNTER — Encounter: Payer: Self-pay | Admitting: Gastroenterology

## 2021-04-05 ENCOUNTER — Ambulatory Visit (INDEPENDENT_AMBULATORY_CARE_PROVIDER_SITE_OTHER): Payer: Medicare Other | Admitting: Gastroenterology

## 2021-04-05 VITALS — BP 108/68 | HR 100 | Ht 64.0 in | Wt 139.8 lb

## 2021-04-05 DIAGNOSIS — Z85038 Personal history of other malignant neoplasm of large intestine: Secondary | ICD-10-CM | POA: Diagnosis not present

## 2021-04-05 MED ORDER — NA SULFATE-K SULFATE-MG SULF 17.5-3.13-1.6 GM/177ML PO SOLN: 1.0000 | Freq: Once | ORAL | 0 refills | Status: AC

## 2021-04-05 NOTE — Patient Instructions (Signed)
You have been scheduled for a colonoscopy. Please follow written instructions given to you at your visit today.  Please pick up your prep supplies at the pharmacy within the next 1-3 days. If you use inhalers (even only as needed), please bring them with you on the day of your procedure.  Due to recent changes in healthcare laws, you may see the results of your imaging and laboratory studies on MyChart before your provider has had a chance to review them.  We understand that in some cases there may be results that are confusing or concerning to you. Not all laboratory results come back in the same time frame and the provider may be waiting for multiple results in order to interpret others.  Please give [place] 48 hours in order for your provider to thoroughly review all the results before contacting the office for clarification of your results.   The [place] [facility] providers would like to encourage you to use [facility] to communicate with providers for non-urgent requests or questions.  Due to long hold times on the telephone, sending your provider a message by [facility] may be a faster and more efficient way to get a response.  Please allow 48 business hours for a response.  Please remember that this is for non-urgent requests.

## 2021-04-05 NOTE — Telephone Encounter (Signed)
Patient with diagnosis of MITRAL AND AORTIC BIOPROSTHETIC VALVE REPLACEMENT,  AND ATRIAL FIBRILLATION on WARFARIN for anticoagulation.    Procedure: COLONOSCOPY Date of procedure: 05/24/2021   Dr Croitour's recommendation per office visit note 03/01/2021 " Due to her increased risk of embolic events, will require "bridging" with enoxaparin for her colonoscopy in August."   Patient anticoagulation NOT follow by our office, current warfarin regimen and INR TTR unknown*   Please contact current anticoagulation clinic to coordinate bridging plan prior to procedure.

## 2021-04-05 NOTE — Telephone Encounter (Signed)
[name]  Please let patient know  Thanks  MR  PS: Devki - awsome. Thanks a lot

## 2021-04-05 NOTE — Telephone Encounter (Signed)
Fwd to Leafy Ro and/or Larene Beach, RN to address.

## 2021-04-05 NOTE — Progress Notes (Signed)
Review of pertinent gastrointestinal problems: 1. Rectal cancer, midrectum ypT3ypN0, s/p neoadj chemoXRT, lap LAR with diverting loop ileostomy 08/2011, ileostomy takedown 03/2012.  Colonoscopy 03/2013 found 1 small TA (next recall 3 years), LAR anastomosis was a bit narrowed, edematous and dilated to 2cm with balloon.  Colonoscopy August 2017 showing normal colocolonic anastomosis in the distal colon, the examination was otherwise normal.   HPI: This is a very pleasant 75 year old [name] whom I last saw here at the time of a colonoscopy August 2017.  See those results summarized above.  She is on Coumadin for valvular heart disease.  Her cardiologist is through [facility].  I am able to review an echocardiogram from earlier this month which shows left ventricular ejection fraction of 60 to 65%.  She recently underwent a bronchoscopy for waxing and waning small pulmonary nodules and was found to have Pseudomonas.  She is being treated for that with antibiotics.  She was put on a Lovenox bridge for the bronchoscopy.  She has no troubles with shortness of breath, she gets around her house quite well.  She is not on oxygen.  She has had no troubles with her bowels since her last colonoscopy 5 years ago.  Specifically no bleeding, no serious constipation or diarrhea.  She has lost almost Diane pounds in the past 5 years with significant intention, weight watchers.   Review of systems: Pertinent positive and negative review of systems were noted in the above HPI section. All other review negative.   Past Medical History:  Diagnosis Date   Acute cystitis without hematuria 12/22/2015   Anemia    Arthritis    CAP (community acquired pneumonia) 02/08/2016   CAP (community acquired pneumonia) 02/08/2016   Cataracts, bilateral    immature   Chronic cholecystitis with calculus 01/21/2015   S/p cholecystectomy    Chronic diastolic heart failure ([place]) 09/19/2014   Chronic insomnia     Constipation    takes Miralax daily as needed   COPD (chronic obstructive pulmonary disease) (HCC)    Albuterol inhaler daily as needed;Duoneb daily as needed;Spiriva daily   DDD (degenerative disc disease)    cervical - kyphosis with mod DD changes C5/6 and C6/7; lumbar - early DD at L2/3 ([place])   Dyspnea    with exertion   Essential hypertension    was on meds but after appointment Dec 11 with Dr.C he took her off   GERD (gastroesophageal reflux disease)    takes Protonix daily   History of pneumonia 02/16/2016   History of radiation therapy 05/30/11 to 07/07/11   rectum   History of shingles    Hyperlipemia    takes Lovastatin daily   Legally blind in left eye, as defined in [place]    Lung nodule    RLL nodule-65mm stable 2006, April 2009, and June 2009   Obesity    Osteopenia 12/2014   T -1.5 hip   Pancreatitis 11/2014   ?zpack related vs gallstone pancreatitis with abnormal HIDA scan pending cholecystectomy   PONV (postoperative nausea and vomiting)    Presence of permanent cardiac pacemaker    Rectal cancer ([place]) 04/2011   T3N0; s/p lap LAR, s/p ileostomy, s/p reversal, s/p chemo   Research study patient 08/26/2016   Rheumatic heart disease mitral stenosis    mod MS by echo 02/2014   S/P AVR (aortic valve replacement) 2015   bioprosthetic ([place])   S/P MVR (mitral valve replacement) 2015   bioprosthetic ([place])   Seizures (  [place])    febrile seizures as a child   Sepsis secondary to UTI ([place]) 08/22/2017   Severe aortic valve stenosis  AVR 3/15 10/01/2013   mild-mod by echo 02/2014   Sleep apnea    PT TOLD BORDERLINE-TRIED CPAP-DID NOT HELP-SHE DOES NOT USE CPAP   Small bowel obstruction, partial ([place]) 08/2013   reolved without NGT placement.    Vitamin B12 deficiency 02/28/2015   Start B12 shots 02/2015     Past Surgical History:  Procedure Laterality Date   ANTERIOR LAT LUMBAR FUSION Left 07/17/2018   Procedure: Lumbar Two-Three Lumbar Three-Four Anterolateral  decompression/interbody fusion with lateral plate fixation/Infuse;  Surgeon: Kristeen Miss, MD;  Location: [place];  Service: Neurosurgery;  Laterality: Left;  Lumbar Two-Three Lumbar Three-Four Anterolateral decompression/interbody fusion with lateral plate fixation/Infuse   AORTIC VALVE REPLACEMENT N/A 12/12/2013   Procedure: AORTIC VALVE REPLACEMENT (AVR);  Surgeon: Gaye Pollack, MD; Service: Open Heart Surgery   BACK SURGERY  2006, 2007   2006 SPACER, 2007 decompression and fusion L4/5   BOWEL RESECTION  04/02/2012   Procedure: SMALL BOWEL RESECTION;  Surgeon: Stark Klein, MD;  Location: [facility];  Service: General;  Laterality: N/A;   BRONCHIAL WASHINGS  03/24/2021   Procedure: BRONCHIAL WASHINGS;  Surgeon: Brand Males, MD;  Location: [facility];  Service: Endoscopy;;   CARPAL TUNNEL RELEASE Bilateral    CATARACT EXTRACTION W/ INTRAOCULAR LENS IMPLANT Left 10/18/2016   CATARACT EXTRACTION W/ INTRAOCULAR LENS IMPLANT Right 12/13/2016   Dr. Kathrin Penner   CHOLECYSTECTOMY N/A 01/21/2015   chronic cholecystitis, Stark Klein, MD   COLON RESECTION  08/25/2011   Procedure: COLON RESECTION LAPAROSCOPIC;  Surgeon: Stark Klein, MD;  Location: [facility];  Service: General;  Laterality: N/A;  Laparoscopic Assisted Low Anterior Resection Diverting Ostomy and onQ pain pump   COLONOSCOPY  03/2013   1 polyp, rpt 3 yrs Ardis Hughs)   COLONOSCOPY WITH PROPOFOL N/A 06/09/2016   patent colo-colonic anastomosis, rpt 5 yrs Ardis Hughs)   EP IMPLANTABLE DEVICE N/A 06/16/2016   Procedure: Pacemaker Implant;  Surgeon: Will Meredith Leeds, MD;  Location: [facility];  Service: Cardiovascular;  Laterality: N/A;   FOOT SURGERY  left foot   hammer toe   ILEOSTOMY  08/25/2011   ILEOSTOMY CLOSURE  04/02/2012   Procedure: ILEOSTOMY TAKEDOWN;  Surgeon: Stark Klein, MD;  Location: [facility];  Service: General;  Laterality: N/A;   INTRAOPERATIVE TRANSESOPHAGEAL ECHOCARDIOGRAM N/A 12/12/2013   Procedure:  INTRAOPERATIVE TRANSESOPHAGEAL ECHOCARDIOGRAM;  Surgeon: Gaye Pollack, MD;  Location: [facility];  Service: Open Heart Surgery;  Laterality: N/A;   LEFT AND RIGHT HEART CATHETERIZATION WITH CORONARY ANGIOGRAM N/A 11/27/2013   Procedure: LEFT AND RIGHT HEART CATHETERIZATION WITH CORONARY ANGIOGRAM;  Surgeon: Sanda Klein, MD;  Location: [facility];  Service: Cardiovascular;  Laterality: N/A;   MITRAL VALVE REPLACEMENT N/A 12/12/2013   Procedure: MITRAL VALVE (MV) REPLACEMENT OR REPAIR;  Surgeon: Gaye Pollack, MD; Service: Open Heart Surgery   TEE WITHOUT CARDIOVERSION N/A 11/29/2013   Procedure: TRANSESOPHAGEAL ECHOCARDIOGRAM (TEE);  Surgeon: Sueanne Margarita, MD;  Location: [facility];  Service: Cardiovascular;  Laterality: N/A;   TEE WITHOUT CARDIOVERSION N/A 06/04/2019   Procedure: TRANSESOPHAGEAL ECHOCARDIOGRAM (TEE);  Surgeon: Sanda Klein, MD;  Location: [place];  Service: Cardiovascular;  Laterality: N/A;   TONSILLECTOMY     VIDEO BRONCHOSCOPY N/A 03/24/2021   Procedure: VIDEO BRONCHOSCOPY WITHOUT FLUORO;  Surgeon: Brand Males, MD;  [facility];  Service: Endoscopy;  Laterality: N/A;  SMALL  SCOPE,PREMEDICATE WITH NEBULIZED LIDOCAINE    Current Outpatient Medications  Medication Sig Dispense Refill   acetaminophen (TYLENOL) 500 MG tablet Take 2 tablets (1,000 mg total) by mouth daily.     albuterol (VENTOLIN HFA) 108 (90 Base) MCG/ACT inhaler Inhale 2 puffs into the lungs every 6 (six) hours as needed for wheezing. 18 g 6   amoxicillin (AMOXIL) 500 MG capsule TAKE 4 CAPSULES BY MOUTH 30-60 MINUTES PRIOR TO APPOINTMENT 4 capsule 5   Biotin 1000 MCG tablet Take 1,000 mcg by mouth daily.     calcium carbonate (OSCAL) 1500 (600 Ca) MG TABS tablet Take 600 mg of elemental calcium by mouth daily.     carboxymethylcellulose (REFRESH PLUS) 0.5 % SOLN Place 1 drop into both eyes 3 (three) times daily as needed (dry eyes).     cephALEXin (KEFLEX) 250 MG capsule Take 250 mg  by mouth daily.     Cholecalciferol (VITAMIN D) 50 MCG (2000 UT) tablet Take 2,000 Units by mouth daily.     docusate sodium (COLACE) 100 MG capsule Take 200 mg by mouth every evening.     furosemide (LASIX) 40 MG tablet TAKE 1 TABLET(40 MG) BY MOUTH DAILY 30 tablet 11   gabapentin (NEURONTIN) 300 MG capsule Take 300 mg by mouth at bedtime.     GEMTESA 75 MG TABS Take 75 mg by mouth daily.     ipratropium-albuterol (DUONEB) 0.5-2.5 (3) MG/3ML SOLN Take 3 mLs by nebulization every 6 (six) hours as needed. 360 mL 0   lovastatin (MEVACOR) 40 MG tablet TAKE 1 TABLET(40 MG) BY MOUTH AT BEDTIME 90 tablet 3   montelukast (SINGULAIR) 10 MG tablet TAKE 1 TABLET(10 MG) BY MOUTH AT BEDTIME 30 tablet 11   Multiple Vitamins-Minerals (PRESERVISION AREDS 2 PO) Take 2 capsules by mouth every evening.      polyethylene glycol (MIRALAX / GLYCOLAX) packet Take 17 g by mouth daily. 14 each 0   potassium chloride (KLOR-CON) 10 MEQ tablet TAKE 2 TABLETS(20 MEQ) BY MOUTH DAILY 60 tablet 9   PSYLLIUM PO Take 1 capsule by mouth daily.     silver sulfADIAZINE (SILVADENE) 1 % cream Apply 1 application topically daily. 50 g 1   traMADol (ULTRAM) 50 MG tablet TAKE 1 TABLET(50 MG) BY MOUTH TWICE DAILY AS NEEDED FOR MODERATE PAIN 30 tablet 0   TRELEGY ELLIPTA 200-62.5-25 MCG/INH AEPB INHALE 1 PUFF INTO THE LUNGS DAILY 60 each 5   vitamin B-12 (CYANOCOBALAMIN) 500 MCG tablet Take 500 mcg by mouth every other day.      warfarin (COUMADIN) 5 MG tablet TAKE 1 TO 2 TABLETS BY MOUTH DAILY AS DIRECTED BY COUMADIN CLINIC 60 tablet 5   Zinc 50 MG CAPS Take 1 capsule (50 mg total) by mouth daily.  0   No current facility-administered medications for this visit.   Facility-Administered Medications Ordered in Other Visits  Medication Dose Route Frequency Provider Last Rate Last Admin   ondansetron (ZOFRAN) 4 mg in sodium chloride 0.9 % 50 mL IVPB  4 mg Intravenous Q6H PRN Kristeen Miss, MD        Allergies as of 04/05/2021 - Review  Complete 04/05/2021  Allergen Reaction Noted   Codeine Nausea Only    Sulfa antibiotics Nausea Only 01/30/2016   Sulfonamide derivatives Nausea Only     Family History  Problem Relation Age of Onset   Lung cancer Father    Heart disease Mother    Diabetes Mother    Hypertension Mother  Stroke Mother    CAD Son 13   Heart attack Son 62   Heart attack Maternal Grandfather    Breast cancer Maternal Aunt 83   Colon cancer Neg Hx    Esophageal cancer Neg Hx    Stomach cancer Neg Hx    Rectal cancer Neg Hx     Social History   Socioeconomic History   Marital status: Widowed    Spouse name: Not on file   Number of children: Not on file   Years of education: Not on file   Highest education level: Not on file  Occupational History   Occupation: retired  Tobacco Use   Smoking status: Former    Packs/day: 1.50    Years: 10.00    Pack years: 15.00    Types: Cigarettes    Quit date: 12/28/1974    Years since quitting: 46.3   Smokeless tobacco: Never   Tobacco comments:    quit smoking in 1976  Vaping Use   Vaping Use: Never used  Substance and Sexual Activity   Alcohol use: Not Currently    Alcohol/week: 2.0 standard drinks    Types: 2 Glasses of wine per week    Comment: very rare   Drug use: No   Sexual activity: Not Currently  Other Topics Concern   Not on file  Social History Narrative   Widow. Husband died 2015-04-03 MVA. 1 cat.    2 sons; 1 grandchild. One son died of MI 34   Edu: Completed HS, some college   Married 1966   Retired-[employer], volunteers at [employer]   Activity: pulm rehab   Diet: some water, fruits/vegetables daily   Social Determinants of Radio broadcast assistant Strain: Not on file  Food Insecurity: Not on file  Transportation Needs: Not on file  Physical Activity: Not on file  Stress: Not on file  Social Connections: Not on file  Intimate Partner Violence: Not on file     Physical Exam: BP 108/68   Pulse 100 Comment: slightly  irregular  Ht 5\' 4"  (1.626 m)   Wt 139 lb 12.8 oz (63.4 kg)   BMI 24.00 kg/m  Constitutional: generally well-appearing Psychiatric: alert and oriented x3 Eyes: extraocular movements intact Mouth: oral pharynx moist, no lesions Neck: supple no lymphadenopathy Cardiovascular: heart regular rate and rhythm Lungs: clear to auscultation bilaterally Abdomen: soft, nontender, nondistended, no obvious ascites, no peritoneal signs, normal bowel sounds Extremities: no lower extremity edema bilaterally Skin: no lesions on visible extremities   Assessment and plan: 75 y.o. female with personal history of colon cancer, chronic blood thinner use  I have recommended repeat colonoscopy at her soonest convenience given her personal history of colon cancer.  She is on Coumadin for valvular heart disease and we will communicate with her cardiologist about holding the Coumadin for 5 days prior to the colonoscopy.  I suspect he will want [place] to again put her on a Lovenox bridge as was done during her recent bronchoscopy.  I see no reason for any further blood tests or imaging studies prior to then.   Please see the "Patient Instructions" section for addition details about the plan.   Owens Loffler, MD [facility] 04/05/2021, 8:56 AM  Cc: Ria Bush, MD  Total time on date of encounter was 45 minutes (this included time spent preparing to see the patient reviewing records; obtaining and/or reviewing separately obtained history; performing a medically appropriate exam and/or evaluation; counseling and educating the patient and family  if present; ordering medications, tests or procedures if applicable; and documenting clinical information in the health record).

## 2021-04-05 NOTE — Telephone Encounter (Addendum)
Dose: Ciprofloxacin 500mg  every 12 hours x 14 days  Recommend INR check with warfarin clinic within a week of starting abx or she will need to notify her warfarin clinic to follow their protocol for monitoring d/t transient risk of increased INR with fluoroquinolones  Recommend she hold zinc supplement (if she is still taking) when she starts ciprofloxacin. If she does not want to hold it, suggest taking zinc at least 4-6 hours before cipro.  No renal adjustment needed.  Knox Saliva, PharmD, MPH Clinical Pharmacist (Rheumatology and Pulmonology)

## 2021-04-05 NOTE — Telephone Encounter (Signed)
[facility] Pre-operative Risk Assessment     Request for surgical clearance:     Endoscopy Procedure  What type of surgery is being performed?     colonoscopy  When is this surgery scheduled?     05/24/21  What type of clearance is required ?   Pharmacy  Are there any medications that need to be held prior to surgery and how long? Warfarin x 5 days ?lovenox bridge  Practice name and name of physician performing surgery?      [facility]  What is your office phone and fax number?      Phone- [phone]  [phone]  Anesthesia type (None, local, MAC, general) ?       MAC

## 2021-04-06 ENCOUNTER — Other Ambulatory Visit: Payer: Self-pay

## 2021-04-06 ENCOUNTER — Ambulatory Visit (INDEPENDENT_AMBULATORY_CARE_PROVIDER_SITE_OTHER): Payer: Medicare Other

## 2021-04-06 DIAGNOSIS — Z7901 Long term (current) use of anticoagulants: Secondary | ICD-10-CM

## 2021-04-06 LAB — POCT INR: INR: 1.9 — AB (ref 2.0–3.0)

## 2021-04-06 MED ORDER — CIPROFLOXACIN HCL 500 MG PO TABS: 500.0000 mg | ORAL_TABLET | Freq: Two times a day (BID) | ORAL | 0 refills | Status: DC

## 2021-04-06 NOTE — Patient Instructions (Addendum)
Pre visit review using our clinic review tool, if applicable. No additional management support is needed unless otherwise documented below in the visit note.  Increase dose today to 7.5mg , increase dose tomorrow to 10mg , increase dose on Thurs to 7.5mg  and then continue normal dosing. Recheck in 1 wk.

## 2021-04-06 NOTE — Telephone Encounter (Signed)
Called and spoke with pt letting her know the results of bronch and recs stated by both MR and Devki, pharmacist and she verbalized understanding. Sent Rx of cipro to pharmacy for pt. Nothing further needed.

## 2021-04-07 NOTE — Telephone Encounter (Signed)
Unsure of primary care coumadin clinic pool. Dr. Danise Mina is PCP. Please advise. Thanks

## 2021-04-07 NOTE — Progress Notes (Signed)
Agree. Thanks

## 2021-04-08 ENCOUNTER — Other Ambulatory Visit: Payer: Self-pay

## 2021-04-08 ENCOUNTER — Ambulatory Visit
Admission: RE | Admit: 2021-04-08 | Discharge: 2021-04-08 | Disposition: A | Discharge status: Home / Self Care | Payer: Medicare Other | Source: Ambulatory Visit | Attending: Family Medicine | Admitting: Family Medicine

## 2021-04-08 DIAGNOSIS — Z1231 Encounter for screening mammogram for malignant neoplasm of breast: Secondary | ICD-10-CM

## 2021-04-13 ENCOUNTER — Ambulatory Visit (INDEPENDENT_AMBULATORY_CARE_PROVIDER_SITE_OTHER): Payer: Medicare Other

## 2021-04-13 ENCOUNTER — Other Ambulatory Visit: Payer: Self-pay

## 2021-04-13 DIAGNOSIS — Z7901 Long term (current) use of anticoagulants: Secondary | ICD-10-CM

## 2021-04-13 LAB — POCT INR: INR: 2 (ref 2.0–3.0)

## 2021-04-13 NOTE — Telephone Encounter (Signed)
Upcoming colonoscopy 05/24/2021. GI is requesting coumadin hold for 5 days, pt will need lovenox bridging again. Will forward to [facility] to assist with bridging.

## 2021-04-13 NOTE — Patient Instructions (Addendum)
Pre visit review using our clinic review tool, if applicable. No additional management support is needed unless otherwise documented below in the visit note.   Change dose for today, tomorrow, Thurs, and Friday to 10mg  and then continue normal dosing. Recheck in one wk.

## 2021-04-14 ENCOUNTER — Ambulatory Visit (INDEPENDENT_AMBULATORY_CARE_PROVIDER_SITE_OTHER): Payer: Medicare Other | Admitting: Internal Medicine

## 2021-04-14 ENCOUNTER — Encounter: Payer: Self-pay | Admitting: Internal Medicine

## 2021-04-14 VITALS — BP 120/70 | HR 88 | Temp 97.4°F | Ht 64.0 in | Wt 140.6 lb

## 2021-04-14 DIAGNOSIS — Z2239 Carrier of other specified bacterial diseases: Secondary | ICD-10-CM

## 2021-04-14 DIAGNOSIS — R918 Other nonspecific abnormal finding of lung field: Secondary | ICD-10-CM | POA: Diagnosis not present

## 2021-04-14 DIAGNOSIS — J449 Chronic obstructive pulmonary disease, unspecified: Secondary | ICD-10-CM | POA: Diagnosis not present

## 2021-04-14 NOTE — Telephone Encounter (Signed)
Thanks, I will get with Randall An, RN Coumadin nurse on this to take over the bridge and follow up with patient. She has an appt with Larene Beach next week on 04/20/21.

## 2021-04-14 NOTE — Addendum Note (Signed)
Addended by: Lorretta Harp on: 04/14/2021 02:21 PM   Modules accepted: Orders

## 2021-04-14 NOTE — Progress Notes (Signed)
PCP Ria Bush, MD  HPI    OV 08/02/2017  Chief Complaint  Patient presents with   Follow-up    Pt states that she got bronchitis x1 month ago. Has been on two rounds of abx and two rounds of steroids. States that it is not in her chest anymore but is still having drainage, sore throat, dry cough, occ. SOB. Denies any CP.    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 She comes for routine follow-up. Recently she had COPD flareup was treated by primary care. Then I called in antibiotic and repeat prednisone and this again helped her. Nevertheless she is frustrated by postnasal drainage and the resultant cough. She says normally for COPD exacerbations are associated with initial onset of sinus congestion. Then she's always left with a residual postnasal drip but this time it has lingered longer. She says Sudafed helps her but is no longer available over-the-counter. She really wants relief about this critically. COPD cat score is 14. Is also asking me to sign handicap parking sticker   OV 10/31/2017  Chief Complaint  Patient presents with   Follow-up    In hosp. 11/13-11/15 due to sepsis from UTI. Pt states she is doing good but can't seem to get her energy back. Pt currently has laryngitis and SOB with exertion.    Follow-up Gold stage II/III COPD not on home oxygen but on triple inhaler therapy.  Is a routine follow-up.  Since I last saw her in the middle of November 2018 she got diagnosed with E. coli sepsis and admitted to the hospital.  Since then she has significant amount of fatigue.  The fatigue is not improved much.  She has suspended her maintenance pulmonary rehabilitation but plans to restart it in March 2019.  She has not had any COPD exacerbations.  She is up-to-date with the flu shot.  Overall COPD CAT score is stable but there is an increase in the level of fatigue.  The only new issue that she complains about is some mild hoarseness  in voice going on for several months.  This is present early in the morning when she wakes up and then as the day goes on it is better.  She notices it when she talks a lot of time in the phone after a long period of rest.  It is insidious onset.  It is not progressive.  There are no other associated symptoms.   OV 02/05/2018  Chief Complaint  Patient presents with   Follow-up    Pt states she saw PCP two weeks ago and had pna. Pt states she is still not over the pna and has no energy, has hoarseness in voice, cough, and has chest tightness.     Follow-up gold stage II/III COPD not on home oxygen but on triple inhaler therapy with Singulair  Routine follow-up.  She tells me t of labs from January 22, 2018 hat January 22, 2018 she started feeling chest tightness wheezing and also increase head cold.  She saw her primary care physician Dr. Danise Mina.  Apparently all the blood work was normal except for mild increase in liver enzymes which she says she fluctuates with.  He was initially thinking a sinus congestion but when he did an x-ray of the report came back as a right lower lobe pneumonia [I personally visualized this x-ray and I agree she might not have right lower lobe pneumonia because in 2015 CT chest  she had some chronic scarring at that area].  She was treated with 5-day Levaquin and a Medrol Dosepak but despite this other than subjective fevers are resolving she still has significant cough congestion and chest tightness without any orthopnea proximal nocturnal dyspnea.  COPD CAT score shows significant deterioration.  She is not feeling well.  Of note, she is intentionally losing weight because of weight watchers but she has not increased her fluid intake adequately.  She has quit all diet soda  Show slight increase in liver enzymes and also slight increase in creatinine compared to 6 months ago.   OV 03/13/2018  Chief Complaint  Patient presents with   Follow-up    Breathing is improved  since last OV. Still reports DOE and occasional cough. Denies chest tightness or wheezing.    Elianah J  presents for follow-up of her gold stage II /3 of COPD.  On triple inhaler therapy with Spiriva and Brio and also on Singulair   Overall in terms of her COPD: She is stable.  She is compliant with her inhalers.  She is not on oxygen therapy.  Her symptoms actually improved.  In the interim she was diagnosed with atrial fibrillation and she is placed on Xarelto.  She is frustrated because she has to be on 1 more extra medication.  She did have pulmonary function test that is documented below because it has been a while since we did one.  It actually shows improvement in the lung function.  She also had a CT scan of the chest because the last one was in 2015.  This shows scattered lung nodules with the largest one 8 mm in the right lower lobe.  The radiologist has described this is nonspecific in appearance.  She heard the result and she has been extremely anxious and upset.  She is worried that her colon cancer has metastasized or she has lung cancer.  Of note, I personally visualized the CT chest and agree with the findings   OV 06/27/2018  Subjective:  Patient ID: [name], female , DOB: Oct 05, 1946 , age 75 y.o. , MRN: [id number] , ADDRESS: [address] [place] [address]   06/27/2018 -   Chief Complaint  Patient presents with   Follow-up    Pt states her breathing has been doing good since last visit. States she has been having some BP issues to the point she fainted and cracked some ribs.  Denies any problems with cough or CP.     HPI Sharla S Stoltzfus 75 y.o. - follow-up for  - COPD: Overall this is stable with inhaler therapy. She has not had her flu shot. She wants to wait 2 weeks because she just had first shot of her shingles vaccine. She is up-to-date with her pneumonia vaccine. In the interim she has lost weight 60 pounds intentionally and this is improved on  Score  Multiple  Lung nodules: She had follow-up CT chest in September 2019 and compared to May 2019: One of the nodules is resolved to 16 mm and stable. She does have some rib fractures subacute and this is because she fell down. She's been having balance issues with the weight loss and orthostatic hypotension. Both of this is being managed by neurosurgery and primary care  Travel advice: She plans to have back surgery for her foot drop after this she plans to recover at her son's home near [place]. Therefore she wants a antibiotic and prednisone taper to be  handy with her in case of a future COPD exacerbation.    Results for CHAZLYN, CUDE (MRN [id number]) as of 03/13/2018 11:58  Ref. Range 11/29/2013 16:04 04/01/2016 10:10 03/13/2018 10:36  FEV1-Pre Latest Units: L 0.93 1.35 1.42  FEV1-%Pred-Pre Latest Units: % 34 55 59   Results for MEIGHAN, TRETO (MRN [id number]) as of 03/13/2018 11:58  Ref. Range 03/13/2018 10:36  DLCO cor Latest Units: ml/min/mmHg 22.70  DLCO cor % pred Latest Units: % 84     CT chest Sept 4, 2019 Lungs/Pleura: Pleural thickening posteriorly in the right hemithorax is stable. There is no significant pleural effusion. There is stable subpleural scarring posteriorly in the right lower lobe. There is central airway thickening with scattered tree-in-bud nodularity in both lungs, similar to the most recent prior study. Nodular scarring in both upper lobes is stable. The right lower lobe perifissural nodule present on the most recent study has resolved. There is a stable 6 x 5 mm right lower lobe nodule on image 119/3. No new or enlarging pulmonary nodules.    IMPRESSION: 1. Generally stable chronic lung disease with pleuroparenchymal scarring and scattered nodularity. Most of these nodules have a tree-in-bud appearance, likely post infectious or inflammatory. The dominant right lower lobe nodule seen on the most recent study has resolved. No new or enlarging  nodules. 2. No acute chest findings. Healing subacute right-sided rib fractures. 3. Extensive Aortic Atherosclerosis (ICD10-I70.0).     Electronically Signed   By: Richardean Sale [name]   On: 06/13/2018 14    ROS - per HPI  OV 01/22/2020  Subjective:  Patient ID: Maribeth Alfonse Flavors, female , DOB: [birth year] , age 23 y.o. , MRN: [id number] , ADDRESS: [address] [place] [address]   01/22/2020 -   Chief Complaint  Patient presents with   Follow-up    Pt states she has been doing good since last visit and denies any complaints.     HPI Shatarra Chiara Coltrin 75 y.o. -presents for follow-up.  Last seen in 2019.  After that because of the pandemic have not seen her.  She saw the nurse practitioner once.  The big interval changes that she has lost a lot of weight.  It is intentional because she is try to keep her self it.  From a functional standpoint COPD symptoms are much improved after weight loss.  She takes Spiriva Singulair and Breo.  However she is having significant back pain.  Other than that she feels fine.  She not able to do pulmonary rehab because of the back pain.  She has had a Covid vaccine  Other issues lung nodules.  Her last CT scan of the chest was in July 2020.  She has waxing and waning.  She is willing to have another CT scan of the chest at the appropriate time interval.      CAT COPD Symptom & Quality of Life Score ([place]) 0 is no burden. 5 is highest burden 08/02/2017  10/31/2017  02/05/2018  06/27/2018 60+ # intentioal weight loss  Never Cough -> Cough all the time _0 No phlegm in chest -> Chest is full of phlegm 0 0 3 0  No chest tightness -> Chest feels very tight 1 0 3 0  No dyspnea for 1 flight stairs/hill -> Very dyspneic for 1 flight of stairs _1 No limitations for ADL at home -> Very limited with ADL at home 1 0  5 3  Confident leaving home -> Not at all confident leaving home 0 0 3 1  Sleep soundly -> Do not sleep  soundly because of lung condition _0 Lots of Energy -> No energy at all _1 TOTAL Score (max 40)  _2 CAT Score 01/22/2020  Total CAT Score 9      ROS - per HPI    OV 07/14/2020  Subjective:  Patient ID: Sadie Alfonse Flavors, female , DOB: 1946/08/06 , age 60 y.o. , MRN: [id number] , ADDRESS: [address] [place] [address]  PCP Ria Bush, MD    07/14/2020 -   Chief Complaint  Patient presents with   Follow-up    no complaints    Advanced COPD on Spiriva and Breo and Singulair Multiple lung nodules with waxing and waning quality -last CT scan July 2020  HPI Parker Kimori Tartaglia 75 y.o. -presents for follow-up of her COPD.  Last seen in early part of this year.  She is supposed to have a follow-up CT scan of the chest but this has not happened.  Patient tells me that she wants to hold off on getting the CT scan of the chest right now but will do it at the time of follow-up.  Last CT scan of the chest was in July 2020.  At this point in time she continues on Spiriva, Breo and Singulair.  She feels well.  She has been social distancing.  She has had her Covid vaccine but will have a booster next week.  She will have a flu shot today but at the Coumadin clinic.  There are no active complaints.  She is interested in consolidating her Spiriva and Breo and to Trelegy.  She denies any recent exacerbations.   CAT Score 07/14/2020 01/22/2020  Total CAT Score 6 9     No flowsheet data found.   No flowsheet data found.    PFT Results Latest Ref Rng & Units 03/13/2018 04/01/2016 11/29/2013  FVC-Pre L 2.21 2.22 1.49  FVC-Predicted Pre % 70 68 42  FVC-Post L - 2.33 1.60  FVC-Predicted Post % - 72 45  Pre FEV1/FVC % % 64 61 63  Post FEV1/FCV % % - 63 65  FEV1-Pre L 1.42 1.35 0.93  FEV1-Predicted Pre % 59 55 34  FEV1-Post L - 1.47 1.05  DLCO uncorrected ml/min/mmHg 24.55 19.21 -  DLCO UNC% % 91 71 -  DLCO corrected ml/min/mmHg 22.70 - -  DLCO  COR %Predicted % 84 - -  DLVA Predicted % 102 95 -  TLC L - 5.00 -  TLC % Predicted % - 93 -  RV % Predicted % - 121 -      ROS - per HPI   OV 01/19/2021  Subjective:  Patient ID: Annaston Alfonse Flavors, female , DOB: 09/14/1946 , age 69 y.o. , MRN: [id number] , ADDRESS: [address] [place] [address] PCP Ria Bush, MD Patient Care Team: Ria Bush, MD as PCP - General (Family Medicine) Sanda Klein, MD as PCP - Cardiology (Cardiology) [name], MD as Consulting Physician (Gastroenterology) Kyung Rudd, MD as Consulting Physician (Radiation Oncology) Odogwu, Genevie Cheshire, MD as Consulting Physician (Hematology and Oncology) Terance Ice, MD (Inactive) as Consulting Physician (Cardiology) Brand Males, MD as Consulting Physician (Pulmonary Disease) Stark Klein, MD (General Surgery) Croitoru, Dani Gobble, MD as Consulting Physician (Cardiology) Shon Hough, MD as Consulting Physician (Ophthalmology) Martinique,  Amy, MD as Consulting Physician (Dermatology)  This Provider for this visit: Treatment Team:  Attending Provider: Lauraine Rinne, NP    01/19/2021 -   Chief Complaint  Patient presents with   Follow-up    No current respiratory concerns. Has questions about 4th COVID booster.     Advanced COPD on Spiriva and Breo and Singulair Multiple lung nodules with waxing and waning quality -last CT scan July 2020   HPI [place] Alissandra Geoffroy 75 y.o. - COPD and nodule followup. Feels well. Trelegy works better. No new issues. WEight stable. Was supposed to have followup CT for nodules from July 2020 but did not. Has upcoming neck CT. She is ok if we pair CT chest with this schedule. She wants to know if she should take 4th covid booster. Her bias is that 3 is good enough +_ she is masking and being careful and would rather wait for higher prevalence of disease or vaccine to new strain    CAT Score 07/14/2020 01/22/2020  Total CAT Score 6  9    OV 03/03/2021  Subjective:  Patient ID: Torah Alfonse Flavors, female , DOB: 1945/11/14 , age 52 y.o. , MRN: [id number] , ADDRESS: [place] [id number] PCP Ria Bush, MD Patient Care Team: Ria Bush, MD as PCP - General (Family Medicine) Sanda Klein, MD as PCP - Cardiology (Cardiology) [name], MD as Consulting Physician (Gastroenterology) Kyung Rudd, MD as Consulting Physician (Radiation Oncology) Odogwu, Genevie Cheshire, MD as Consulting Physician (Hematology and Oncology) Terance Ice, MD (Inactive) as Consulting Physician (Cardiology) Brand Males, MD as Consulting Physician (Pulmonary Disease) Stark Klein, MD (General Surgery) Sanda Klein, MD as Consulting Physician (Cardiology) Shon Hough, MD as Consulting Physician (Ophthalmology) Martinique, Amy, MD as Consulting Physician (Dermatology)  This Provider for this visit: Treatment Team:  Attending Provider: Brand Males, MD    03/03/2021 -   Chief Complaint  Patient presents with   Follow-up    Pt had CT performed 4/19 and is here today to discuss the results. States breathing is stable.   Advanced COPD on Spiriva and Breo and Singulair Multiple lung nodules with waxing and waning quality -last CT scan July 2020  HPI Jeydi Nate Perri 75 y.o. -returns for follow-up to discuss CT scan results.  She continues on Trelegy and she is feeling well but she is worried about the CT scan results.  I personally visualized the CT report.  It shows increase in nodular densities that are progressive in the last 2 years.  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  No evidence of cancer or pulmonary fibrosis.  Her overall symptom burden is minimal.  She had questions about what Mycobacterium avium complex is.  We went over this.  We discussed bronchoscopy.  She prefers to do it under sedation.  She is very concerned about the fact she is on Coumadin.  She does not want to stop this  because she has a heart valve.  She wants me to talk to her cardiologist about this.  Explained to her that by not doing a biopsy.  There is some risk of bleeding particularly with scope trauma either epistaxis or in the airway but this risk would be low.  We can try to minimize the risk by going to the oral cavity or even intubation.  I have written to cardiologist Dr. Sallyanne Kuster about this.  Did outline general management plan with MAI.  CAT Score 07/14/2020 01/22/2020  Total CAT Score 6 9  CT Chest data 01/26/21   Mediastinum/Nodes: Mediastinal lymph nodes measure up to 12 mm in the low right paratracheal station, unchanged. Hilar regions are difficult to definitively evaluate without IV contrast. No axillary adenopathy. Esophagus is grossly unremarkable.   Lungs/Pleura: Biapical pleuroparenchymal scarring, right greater than left. Ill-defined peribronchovascular nodularity and peribronchovascular nodular consolidation, progressive from 04/22/2019. Largest new individual nodule measures 7 mm in the right middle lobe (5 x 8 mm, 3/97). Associated mild bronchiectasis and scattered mucoid impaction. Pleuroparenchymal scarring in the right middle and right lower lobes. No pleural fluid. Airway is unremarkable. IMPRESSION: 1. 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 2. Largest new individual nodule measures 7 mm in the right middle lobe and is strongly favored to be infectious in etiology. Non-contrast chest CT at 3-6 months is recommended. If the nodules are stable at time of repeat CT, then future CT at 18-24 months (from today's scan) is considered optional for low-risk patients, but is recommended for high-risk patients. This recommendation follows the consensus statement: Guidelines for Management of Incidental Pulmonary Nodules Detected  on CT Images: From the Fleischner Society 2017; Radiology 2017; 284:228-243. 3. Ascending aortic aneurysm, stable. Recommend annual imaging followup by CTA or MRA. This recommendation follows 2010 ACCF/AHA/AATS/ACR/ASA/SCA/SCAI/SIR/STS/SVM Guidelines for the Diagnosis and Management of Patients with Thoracic Aortic Disease. Circulation. 2010; 121: D326-Z124. Aortic aneurysm NOS (ICD10-I71.9). 4.  Aortic atherosclerosis (ICD10-I70.0). 5. Enlarged pulmonic trunk, indicative pulmonary arterial hypertension.     Electronically Signed   By: Lorin Picket [name]   On: 01/27/2021 08:41       No results found.       OV 04/14/2021  Subjective:  Patient ID: Tully Alfonse Flavors, female , DOB: 1945/10/26 , age 29 y.o. , MRN: [id number] , ADDRESS: [place] [phone] PCP Ria Bush, MD Patient Care Team: Ria Bush, MD as PCP - General (Family Medicine) Sanda Klein, MD as PCP - Cardiology (Cardiology) [name], MD as Consulting Physician (Gastroenterology) Kyung Rudd, MD as Consulting Physician (Radiation Oncology) Nira Retort, MD as Consulting Physician (Hematology and Oncology) Terance Ice, MD (Inactive) as Consulting Physician (Cardiology) Brand Males, MD as Consulting Physician (Pulmonary Disease) Stark Klein, MD (General Surgery) Sanda Klein, MD as Consulting Physician (Cardiology) Shon Hough, MD as Consulting Physician (Ophthalmology) Martinique, Amy, MD as Consulting Physician (Dermatology)  This Provider for this visit: Treatment Team:  Attending Provider: Brand Males, MD    04/14/2021 -   Chief Complaint  Patient presents with   Follow-up    Pt had bronchoscopy performed 6/15 and is here today to discuss results.  Pt states that she has been doing okay since last visit. States that she has been taking the abx as prescribed based on the results of the prior bronch.     HPI Alanii Jodeci Rini 75 y.o. -returns for follow-up of her COPD and multiple lung nodules that were enlarging.  On March 24, 2021 she underwent bronchoscopy with lavage of right middle lobe and left upper lobe.  Both grew Pseudomonas.  We put her on ciprofloxacin.  She returns for follow-up.  She says there is no difference in the ciprofloxacin.  She had questions about what Pseudomonas colonization means.  I also informed her when I did bronchoscopy I found mucus casting in various aspects of the right lung but also in the left upper lobe entrance.  The right sided ones were easily  friable but on touch because significant submucosal bleeding.  Therefore did not touch the left upper lobe.  I showed her a picture of this.  Told her that she might need repeat bronchoscopy in the future.  At this point in time she is stable.  There is no Mycobacterium avium complex.  She is currently on a course of Cipro.    CAT Score 04/14/2021 07/14/2020 01/22/2020  Total CAT Score _0 Results for OLANDA, BOUGHNER (MRN [id number]) as of 04/14/2021 13:55  Ref. Range 03/24/2021 14:46  Organism ID, Bacteria RML and LUL Unknown PSEUDOMONAS AERUGINOSA     PFT  PFT Results Latest Ref Rng & Units 03/13/2018 04/01/2016 11/29/2013  FVC-Pre L 2.21 2.22 1.49  FVC-Predicted Pre % 70 68 42  FVC-Post L - 2.33 1.60  FVC-Predicted Post % - 72 45  Pre FEV1/FVC % % 64 61 63  Post FEV1/FCV % % - 63 65  FEV1-Pre L 1.42 1.35 0.93  FEV1-Predicted Pre % 59 55 34  FEV1-Post L - 1.47 1.05  DLCO uncorrected ml/min/mmHg 24.55 19.21 -  DLCO UNC% % 91 71 -  DLCO corrected ml/min/mmHg 22.70 - -  DLCO COR %Predicted % 84 - -  DLVA Predicted % 102 95 -  TLC L - 5.00 -  TLC % Predicted % - 93 -  RV % Predicted % - 121 -       has a past medical history of Acute cystitis without hematuria (12/22/2015), Anemia, Arthritis, CAP (community acquired pneumonia) (02/08/2016), CAP (community acquired pneumonia) (02/08/2016), Cataracts, bilateral, Chronic  cholecystitis with calculus (01/21/2015), Chronic diastolic heart failure ([place]) (09/19/2014), Chronic insomnia, Constipation, COPD (chronic obstructive pulmonary disease) ([place]), DDD (degenerative disc disease), Dyspnea, Essential hypertension, GERD (gastroesophageal reflux disease), History of pneumonia (02/16/2016), History of radiation therapy (05/30/11 to 07/07/11), History of shingles, Hyperlipemia, Legally blind in left eye, as defined in [place], Lung nodule, Obesity, Osteopenia (12/2014), Pancreatitis (11/2014), PONV (postoperative nausea and vomiting), Presence of permanent cardiac pacemaker, Rectal cancer ([place]) (04/2011), Research study patient (08/26/2016), Rheumatic heart disease mitral stenosis, S/P AVR (aortic valve replacement) (2015), S/P MVR (mitral valve replacement) (2015), Seizures ([place]), Sepsis secondary to UTI ([place]) (08/22/2017), Severe aortic valve stenosis  AVR 3/15 (10/01/2013), Sleep apnea, Small bowel obstruction, partial ([place]) (08/2013), and Vitamin B12 deficiency (02/28/2015).   reports that she quit smoking about 46 years ago. Her smoking use included cigarettes. She has a 15.00 pack-year smoking history. She has never used smokeless tobacco.  Past Surgical History:  Procedure Laterality Date   ANTERIOR LAT LUMBAR FUSION Left 07/17/2018   Procedure: Lumbar Two-Three Lumbar Three-Four Anterolateral decompression/interbody fusion with lateral plate fixation/Infuse;  Surgeon: Kristeen Miss, MD;  Location: [facility];  Service: Neurosurgery;  Laterality: Left;  Lumbar Two-Three Lumbar Three-Four Anterolateral decompression/interbody fusion with lateral plate fixation/Infuse   AORTIC VALVE REPLACEMENT N/A 12/12/2013   Procedure: AORTIC VALVE REPLACEMENT (AVR);  Surgeon: Gaye Pollack, MD; Service: Open Heart Surgery   BACK SURGERY  2006, 2007   2006 SPACER, 2007 decompression and fusion L4/5   BOWEL RESECTION  04/02/2012   Procedure: SMALL BOWEL RESECTION;  Surgeon: Stark Klein, MD;  Location:  [facility];  Service: General;  Laterality: N/A;   BRONCHIAL WASHINGS  03/24/2021   Procedure: BRONCHIAL WASHINGS;  Surgeon: Brand Males, MD;  Location: [facility];  Service: Endoscopy;;   CARPAL TUNNEL RELEASE Bilateral    CATARACT EXTRACTION W/ INTRAOCULAR LENS IMPLANT Left 10/18/2016   CATARACT EXTRACTION W/ INTRAOCULAR  LENS IMPLANT Right 12/13/2016   Dr. Kathrin Penner   CHOLECYSTECTOMY N/A 01/21/2015   chronic cholecystitis, Stark Klein, MD   COLON RESECTION  08/25/2011   Procedure: COLON RESECTION LAPAROSCOPIC;  Surgeon: Stark Klein, MD;  Location: [facility];  Service: General;  Laterality: N/A;  Laparoscopic Assisted Low Anterior Resection Diverting Ostomy and onQ pain pump   COLONOSCOPY  03/2013   1 polyp, rpt 3 yrs Ardis Hughs)   COLONOSCOPY WITH PROPOFOL N/A 06/09/2016   patent colo-colonic anastomosis, rpt 5 yrs Ardis Hughs)   EP IMPLANTABLE DEVICE N/A 06/16/2016   Procedure: Pacemaker Implant;  Surgeon: Will Meredith Leeds, MD;  Location: [facility];  Service: Cardiovascular;  Laterality: N/A;   FOOT SURGERY  left foot   hammer toe   ILEOSTOMY  08/25/2011   ILEOSTOMY CLOSURE  04/02/2012   Procedure: ILEOSTOMY TAKEDOWN;  Surgeon: Stark Klein, MD;  Location: [facility];  Service: General;  Laterality: N/A;   INTRAOPERATIVE TRANSESOPHAGEAL ECHOCARDIOGRAM N/A 12/12/2013   Procedure: INTRAOPERATIVE TRANSESOPHAGEAL ECHOCARDIOGRAM;  Surgeon: Gaye Pollack, MD;  Location: [facility];  Service: Open Heart Surgery;  Laterality: N/A;   LEFT AND RIGHT HEART CATHETERIZATION WITH CORONARY ANGIOGRAM N/A 11/27/2013   Procedure: LEFT AND RIGHT HEART CATHETERIZATION WITH CORONARY ANGIOGRAM;  Surgeon: Sanda Klein, MD;  Location: [facility];  Service: Cardiovascular;  Laterality: N/A;   MITRAL VALVE REPLACEMENT N/A 12/12/2013   Procedure: MITRAL VALVE (MV) REPLACEMENT OR REPAIR;  Surgeon: Gaye Pollack, MD; Service: Open Heart Surgery   TEE WITHOUT CARDIOVERSION N/A 11/29/2013   Procedure:  TRANSESOPHAGEAL ECHOCARDIOGRAM (TEE);  Surgeon: Sueanne Margarita, MD;  Location: [facility] ENDOSCOPY;  Service: Cardiovascular;  Laterality: N/A;   TEE WITHOUT CARDIOVERSION N/A 06/04/2019   Procedure: TRANSESOPHAGEAL ECHOCARDIOGRAM (TEE);  Surgeon: Sanda Klein, MD;  Location: [facility];  Service: Cardiovascular;  Laterality: N/A;   TONSILLECTOMY     VIDEO BRONCHOSCOPY N/A 03/24/2021   Procedure: VIDEO BRONCHOSCOPY WITHOUT FLUORO;  Surgeon: Brand Males, MD;  [facility]: [facility];  Service: Endoscopy;  Laterality: N/A;  SMALL SCOPE,PREMEDICATE WITH NEBULIZED LIDOCAINE    Allergies  Allergen Reactions   Codeine Nausea Only   Sulfa Antibiotics Nausea Only   Sulfonamide Derivatives Nausea Only    Immunization History  Administered Date(s) Administered   Fluad Quad(high Dose 65+) 07/11/2019, 07/14/2020   H1N1 09/17/2008   Influenza Split 07/18/2012   Influenza Whole 08/10/2005, 06/24/2009, 07/11/2011   Influenza, High Dose Seasonal PF 07/04/2016, 07/04/2017   Influenza,inj,Quad PF,6+ Mos 07/09/2018   Influenza-Unspecified 07/24/2013, 06/30/2014, 06/30/2015   Moderna SARS-COV2 Booster Vaccination 08/24/2020   Moderna Sars-Covid-2 Vaccination 02/26/2021   PFIZER(Purple Top)SARS-COV-2 Vaccination 10/30/2019, 11/20/2019   Pneumococcal Conjugate-13 06/02/2014   Pneumococcal Polysaccharide-23 08/10/2005, 04/21/2008, 06/25/2013   Td 10/11/2005   Zoster Recombinat (Shingrix) 06/25/2018, 08/28/2018   Zoster, Live 09/16/2014    Family History  Problem Relation Age of Onset   Lung cancer Father    Heart disease Mother    Diabetes Mother    Hypertension Mother    Stroke Mother    CAD Son 87   Heart attack Son 39   Heart attack Maternal Grandfather    Breast cancer Maternal Aunt 83   Colon cancer Neg Hx    Esophageal cancer Neg Hx    Stomach cancer Neg Hx    Rectal cancer Neg Hx      Current Outpatient Medications:    acetaminophen (TYLENOL) 500 MG tablet, Take 2 tablets  (1,000 mg total) by mouth daily., Disp: , Rfl:  albuterol (VENTOLIN HFA) 108 (90 Base) MCG/ACT inhaler, Inhale 2 puffs into the lungs every 6 (six) hours as needed for wheezing., Disp: 18 g, Rfl: 6   amoxicillin (AMOXIL) 500 MG capsule, TAKE 4 CAPSULES BY MOUTH 30-60 MINUTES PRIOR TO APPOINTMENT, Disp: 4 capsule, Rfl: 5   Biotin 1000 MCG tablet, Take 1,000 mcg by mouth daily., Disp: , Rfl:    calcium carbonate (OSCAL) 1500 (600 Ca) MG TABS tablet, Take 600 mg of elemental calcium by mouth daily., Disp: , Rfl:    carboxymethylcellulose (REFRESH PLUS) 0.5 % SOLN, Place 1 drop into both eyes 3 (three) times daily as needed (dry eyes)., Disp: , Rfl:    cephALEXin (KEFLEX) 250 MG capsule, Take 250 mg by mouth daily., Disp: , Rfl:    Cholecalciferol (VITAMIN D) 50 MCG (2000 UT) tablet, Take 2,000 Units by mouth daily., Disp: , Rfl:    ciprofloxacin (CIPRO) 500 MG tablet, Take 1 tablet (500 mg total) by mouth 2 (two) times daily., Disp: 28 tablet, Rfl: 0   docusate sodium (COLACE) 100 MG capsule, Take 200 mg by mouth every evening., Disp: , Rfl:    furosemide (LASIX) 40 MG tablet, TAKE 1 TABLET(40 MG) BY MOUTH DAILY, Disp: 30 tablet, Rfl: 11   gabapentin (NEURONTIN) 300 MG capsule, Take 300 mg by mouth at bedtime., Disp: , Rfl:    GEMTESA 75 MG TABS, Take 75 mg by mouth daily., Disp: , Rfl:    ipratropium-albuterol (DUONEB) 0.5-2.5 (3) MG/3ML SOLN, Take 3 mLs by nebulization every 6 (six) hours as needed., Disp: 360 mL, Rfl: 0   lovastatin (MEVACOR) 40 MG tablet, TAKE 1 TABLET(40 MG) BY MOUTH AT BEDTIME, Disp: 90 tablet, Rfl: 3   montelukast (SINGULAIR) 10 MG tablet, TAKE 1 TABLET(10 MG) BY MOUTH AT BEDTIME, Disp: 30 tablet, Rfl: 11   Multiple Vitamins-Minerals (PRESERVISION AREDS 2 PO), Take 2 capsules by mouth every evening. , Disp: , Rfl:    polyethylene glycol (MIRALAX / GLYCOLAX) packet, Take 17 g by mouth daily., Disp: 14 each, Rfl: 0   potassium chloride (KLOR-CON) 10 MEQ tablet, TAKE 2  TABLETS(20 MEQ) BY MOUTH DAILY, Disp: 60 tablet, Rfl: 9   PSYLLIUM PO, Take 1 capsule by mouth daily., Disp: , Rfl:    silver sulfADIAZINE (SILVADENE) 1 % cream, Apply 1 application topically daily., Disp: 50 g, Rfl: 1   traMADol (ULTRAM) 50 MG tablet, TAKE 1 TABLET(50 MG) BY MOUTH TWICE DAILY AS NEEDED FOR MODERATE PAIN, Disp: 30 tablet, Rfl: 0   TRELEGY ELLIPTA 200-62.5-25 MCG/INH AEPB, INHALE 1 PUFF INTO THE LUNGS DAILY, Disp: 60 each, Rfl: 5   vitamin B-12 (CYANOCOBALAMIN) 500 MCG tablet, Take 500 mcg by mouth every other day. , Disp: , Rfl:    warfarin (COUMADIN) 5 MG tablet, TAKE 1 TO 2 TABLETS BY MOUTH DAILY AS DIRECTED BY COUMADIN CLINIC, Disp: 60 tablet, Rfl: 5   Zinc 50 MG CAPS, Take 1 capsule (50 mg total) by mouth daily., Disp: , Rfl: 0 No current facility-administered medications for this visit.  Facility-Administered Medications Ordered in Other Visits:    ondansetron (ZOFRAN) 4 mg in sodium chloride 0.9 % 50 mL IVPB, 4 mg, Intravenous, Q6H PRN, Kristeen Miss, MD      Objective:   Vitals:   04/14/21 1332  BP: 120/70  Pulse: 88  Temp: (!) 97.4 F (36.3 C)  TempSrc: Oral  SpO2: 97%  Weight: 140 lb 9.6 oz (63.8 kg)  Height: _0  (1.626 m)    Estimated  body mass index is 24.13 kg/m as calculated from the following:   Height as of this encounter: _0  (1.626 m).   Weight as of this encounter: 140 lb 9.6 oz (63.8 kg).  _1 @  Filed Weights   04/14/21 1332  Weight: 140 lb 9.6 oz (63.8 kg)     Physical Exam    General: No distress. Looks well Neuro: Alert and Oriented x 3. GCS 15. Speech normal Psych: Pleasant Resp:  Barrel Chest - mild yes.  Wheeze - no, Crackles - no, No overt respiratory distress CVS: Normal heart sounds. Murmurs - no Ext: Stigmata of Connective Tissue Disease - no HEENT: Normal upper airway. PEERL +. No post nasal drip        Assessment:       ICD-10-CM   1. Moderate COPD (chronic obstructive pulmonary disease) (HCC)   J44.9     2. Multiple lung nodules on CT  R91.8     3. 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 Moderate COPD (chronic obstructive pulmonary disease) (HCC)  J44.9     2. Multiple lung nodules on CT  R91.8     3. 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 [name] in 4 to 6 months  -Possible repeat bronchoscopy required to ensure there is no mucous plugging but we can decide this based on the results of the CT scan    SIGNATURE    Dr. Brand Males, [name], F.C.C.P,  Pulmonary and Critical Care Medicine Staff Physician, [place] Director - Interstitial Lung Disease  Program  Pulmonary [place] at [place], [place], [address]  Pager: [phone], If no answer or between  15:00h - 7:00h: call [phone] Telephone: [phone]  2:19 PM 04/14/2021

## 2021-04-14 NOTE — Telephone Encounter (Signed)
Coumadin clinic nurse is aware of colonoscopy on 8/15 and will work on lovenox bridge.

## 2021-04-14 NOTE — Patient Instructions (Signed)
ICD-10-CM   1. Moderate COPD (chronic obstructive pulmonary disease) (HCC)  J44.9     2. Multiple lung nodules on CT  R91.8     3. 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 [name] in 4 to 6 months  -Possible repeat bronchoscopy required to ensure there is no mucous plugging but we can decide this based on the results of the CT scan

## 2021-04-20 ENCOUNTER — Ambulatory Visit (INDEPENDENT_AMBULATORY_CARE_PROVIDER_SITE_OTHER): Payer: Medicare Other

## 2021-04-20 ENCOUNTER — Other Ambulatory Visit: Payer: Self-pay

## 2021-04-20 DIAGNOSIS — Z7901 Long term (current) use of anticoagulants: Secondary | ICD-10-CM | POA: Diagnosis not present

## 2021-04-20 DIAGNOSIS — Z5181 Encounter for therapeutic drug level monitoring: Secondary | ICD-10-CM | POA: Diagnosis not present

## 2021-04-20 LAB — POCT INR: INR: 2.9 (ref 2.0–3.0)

## 2021-04-20 NOTE — Patient Instructions (Addendum)
Pre visit review using our clinic review tool, if applicable. No additional management support is needed unless otherwise documented below in the visit note.  Continue taking 5 mg daily except take 2.5 mg on Sun and Wed. Recheck in 4 wks.

## 2021-04-22 ENCOUNTER — Other Ambulatory Visit: Payer: Self-pay

## 2021-04-22 ENCOUNTER — Encounter: Payer: Self-pay | Admitting: Podiatry

## 2021-04-22 ENCOUNTER — Ambulatory Visit (INDEPENDENT_AMBULATORY_CARE_PROVIDER_SITE_OTHER): Payer: Medicare Other | Admitting: Podiatry

## 2021-04-22 DIAGNOSIS — L603 Nail dystrophy: Secondary | ICD-10-CM

## 2021-04-22 NOTE — Progress Notes (Signed)
Subjective:  Patient ID: [name], female    DOB: 12-31-1945,  MRN: [id number]  Chief Complaint  Patient presents with   Nail Problem    Pt stated that she lost half of her nail     75 y.o. female presents with the above complaint.  Patient presents with complaint left hallux nail dystrophy.  Patient states that she lost half of her nail from unknown etiology.  She states that she was walking today and fell off.  She just wanted does not want the rest of the nail getting caught on.  She would like for me to remove the nail.  She is having some pain she denies any other acute complaints.  She is known to Dr. Milinda Pointer.   Review of Systems: Negative except as noted in the HPI. Denies N/V/F/Ch.  Past Medical History:  Diagnosis Date   Acute cystitis without hematuria 12/22/2015   Anemia    Arthritis    CAP (community acquired pneumonia) 02/08/2016   CAP (community acquired pneumonia) 02/08/2016   Cataracts, bilateral    immature   Chronic cholecystitis with calculus 01/21/2015   S/p cholecystectomy    Chronic diastolic heart failure ([place]) 09/19/2014   Chronic insomnia    Constipation    takes Miralax daily as needed   COPD (chronic obstructive pulmonary disease) (HCC)    Albuterol inhaler daily as needed;Duoneb daily as needed;Spiriva daily   DDD (degenerative disc disease)    cervical - kyphosis with mod DD changes C5/6 and C6/7; lumbar - early DD at L2/3 ([place])   Dyspnea    with exertion   Essential hypertension    was on meds but after appointment Dec 11 with Dr.C he took her off   GERD (gastroesophageal reflux disease)    takes Protonix daily   History of pneumonia 02/16/2016   History of radiation therapy 05/30/11 to 07/07/11   rectum   History of shingles    Hyperlipemia    takes Lovastatin daily   Legally blind in left eye, as defined in [place]    Lung nodule    RLL nodule-27mm stable 2006, April 2009, and June 2009   Obesity    Osteopenia 12/2014   T -1.5 hip    Pancreatitis 11/2014   ?zpack related vs gallstone pancreatitis with abnormal HIDA scan pending cholecystectomy   PONV (postoperative nausea and vomiting)    Presence of permanent cardiac pacemaker    Rectal cancer ([place]) 04/2011   T3N0; s/p lap LAR, s/p ileostomy, s/p reversal, s/p chemo   Research study patient 08/26/2016   Rheumatic heart disease mitral stenosis    mod MS by echo 02/2014   S/P AVR (aortic valve replacement) 2015   bioprosthetic ([place])   S/P MVR (mitral valve replacement) 2015   bioprosthetic ([place])   Seizures (HCC)    febrile seizures as a child   Sepsis secondary to UTI ([place]) 08/22/2017   Severe aortic valve stenosis  AVR 3/15 10/01/2013   mild-mod by echo 02/2014   Sleep apnea    PT TOLD BORDERLINE-TRIED CPAP-DID NOT HELP-SHE DOES NOT USE CPAP   Small bowel obstruction, partial ([place]) 08/2013   reolved without NGT placement.    Vitamin B12 deficiency 02/28/2015   Start B12 shots 02/2015     Current Outpatient Medications:    acetaminophen (TYLENOL) 500 MG tablet, Take 2 tablets (1,000 mg total) by mouth daily., Disp: , Rfl:    albuterol (VENTOLIN HFA) 108 (90 Base) MCG/ACT inhaler, Inhale 2  puffs into the lungs every 6 (six) hours as needed for wheezing., Disp: 18 g, Rfl: 6   amoxicillin (AMOXIL) 500 MG capsule, TAKE 4 CAPSULES BY MOUTH 30-60 MINUTES PRIOR TO APPOINTMENT, Disp: 4 capsule, Rfl: 5   Biotin 1000 MCG tablet, Take 1,000 mcg by mouth daily., Disp: , Rfl:    calcium carbonate (OSCAL) 1500 (600 Ca) MG TABS tablet, Take 600 mg of elemental calcium by mouth daily., Disp: , Rfl:    carboxymethylcellulose (REFRESH PLUS) 0.5 % SOLN, Place 1 drop into both eyes 3 (three) times daily as needed (dry eyes)., Disp: , Rfl:    cephALEXin (KEFLEX) 250 MG capsule, Take 250 mg by mouth daily., Disp: , Rfl:    Cholecalciferol (VITAMIN D) 50 MCG (2000 UT) tablet, Take 2,000 Units by mouth daily., Disp: , Rfl:    ciprofloxacin (CIPRO) 500 MG tablet, Take 1 tablet (500 mg  total) by mouth 2 (two) times daily., Disp: 28 tablet, Rfl: 0   docusate sodium (COLACE) 100 MG capsule, Take 200 mg by mouth every evening., Disp: , Rfl:    furosemide (LASIX) 40 MG tablet, TAKE 1 TABLET(40 MG) BY MOUTH DAILY, Disp: 30 tablet, Rfl: 11   gabapentin (NEURONTIN) 300 MG capsule, Take 300 mg by mouth at bedtime., Disp: , Rfl:    GEMTESA 75 MG TABS, Take 75 mg by mouth daily., Disp: , Rfl:    ipratropium-albuterol (DUONEB) 0.5-2.5 (3) MG/3ML SOLN, Take 3 mLs by nebulization every 6 (six) hours as needed., Disp: 360 mL, Rfl: 0   lovastatin (MEVACOR) 40 MG tablet, TAKE 1 TABLET(40 MG) BY MOUTH AT BEDTIME, Disp: 90 tablet, Rfl: 3   montelukast (SINGULAIR) 10 MG tablet, TAKE 1 TABLET(10 MG) BY MOUTH AT BEDTIME, Disp: 30 tablet, Rfl: 11   Multiple Vitamins-Minerals (PRESERVISION AREDS 2 PO), Take 2 capsules by mouth every evening. , Disp: , Rfl:    polyethylene glycol (MIRALAX / GLYCOLAX) packet, Take 17 g by mouth daily., Disp: 14 each, Rfl: 0   potassium chloride (KLOR-CON) 10 MEQ tablet, TAKE 2 TABLETS(20 MEQ) BY MOUTH DAILY, Disp: 60 tablet, Rfl: 9   PSYLLIUM PO, Take 1 capsule by mouth daily., Disp: , Rfl:    silver sulfADIAZINE (SILVADENE) 1 % cream, Apply 1 application topically daily., Disp: 50 g, Rfl: 1   traMADol (ULTRAM) 50 MG tablet, TAKE 1 TABLET(50 MG) BY MOUTH TWICE DAILY AS NEEDED FOR MODERATE PAIN, Disp: 30 tablet, Rfl: 0   TRELEGY ELLIPTA 200-62.5-25 MCG/INH AEPB, INHALE 1 PUFF INTO THE LUNGS DAILY, Disp: 60 each, Rfl: 5   vitamin B-12 (CYANOCOBALAMIN) 500 MCG tablet, Take 500 mcg by mouth every other day. , Disp: , Rfl:    warfarin (COUMADIN) 5 MG tablet, TAKE 1 TO 2 TABLETS BY MOUTH DAILY AS DIRECTED BY COUMADIN CLINIC, Disp: 60 tablet, Rfl: 5   Zinc 50 MG CAPS, Take 1 capsule (50 mg total) by mouth daily., Disp: , Rfl: 0 No current facility-administered medications for this visit.  Facility-Administered Medications Ordered in Other Visits:    ondansetron (ZOFRAN) 4 mg  in sodium chloride 0.9 % 50 mL IVPB, 4 mg, Intravenous, Q6H PRN, Kristeen Miss, MD  Social History   Tobacco Use  Smoking Status Former   Packs/day: 1.50   Years: 10.00   Pack years: 15.00   Types: Cigarettes   Quit date: 12/28/1974   Years since quitting: 46.3  Smokeless Tobacco Never  Tobacco Comments   quit smoking in 1976    Allergies  Allergen Reactions  Codeine Nausea Only   Sulfa Antibiotics Nausea Only   Sulfonamide Derivatives Nausea Only   Objective:  There were no vitals filed for this visit. There is no height or weight on file to calculate BMI. Constitutional Well developed. Well nourished.  Vascular Dorsalis pedis pulses palpable bilaterally. Posterior tibial pulses palpable bilaterally. Capillary refill normal to all digits.  No cyanosis or clubbing noted. Pedal hair growth normal.  Neurologic Normal speech. Oriented to person, place, and time. Epicritic sensation to light touch grossly present bilaterally.  Dermatologic Pain on palpation of the entire/total nail on 1st digit of the left No other open wounds. No skin lesions.  Orthopedic: Normal joint ROM without pain or crepitus bilaterally. No visible deformities. No bony tenderness.   Radiographs: None Assessment:   1. Nail dystrophy    Plan:  Patient was evaluated and treated and all questions answered.  Nail contusion/dystrophy hallux, left -Patient elects to proceed with minor surgery to remove entire toenail today. Consent reviewed and signed by patient. -Entire/total nail excised. See procedure note. -Educated on post-procedure care including soaking. Written instructions provided and reviewed. -Patient to follow up in 2 weeks for nail check.  Procedure: Excision of entire/total nail  Location: Left 1st toehallux digit Anesthesia: Lidocaine 1% plain; 1.5 mL and Marcaine 0.5% plain; 1.5 mL, digital block. Skin Prep: Betadine. Dressing: Silvadene; telfa; dry, sterile, compression  dressing. Technique: Following skin prep, the toe was exsanguinated and a tourniquet was secured at the base of the toe. The affected nail border was freed and excised. The tourniquet was then removed and sterile dressing applied. Disposition: Patient tolerated procedure well. Patient to return in 2 weeks for follow-up.   No follow-ups on file.

## 2021-04-23 LAB — FUNGUS CULTURE WITH STAIN

## 2021-04-23 LAB — FUNGUS CULTURE RESULT

## 2021-04-23 LAB — FUNGAL ORGANISM REFLEX

## 2021-04-24 ENCOUNTER — Other Ambulatory Visit: Payer: Self-pay | Admitting: Cardiovascular Disease

## 2021-04-28 ENCOUNTER — Other Ambulatory Visit: Payer: Self-pay | Admitting: Family Medicine

## 2021-04-28 NOTE — Telephone Encounter (Signed)
Name of Medication: Tramadol Name of Pharmacy: [facility] [place] or Written Date and Quantity: 03/31/21, #30 Last Office Visit and Type: 03/19/21, L hip pain Next Office Visit and Type: 06/16/21, AWV prt 2 Last Controlled Substance Agreement Date: none Last UDS: none

## 2021-04-28 NOTE — Telephone Encounter (Signed)
ERx 

## 2021-04-29 NOTE — Telephone Encounter (Signed)
Agree with below dosing. Thank you.

## 2021-04-29 NOTE — Telephone Encounter (Signed)
Coumadin dosing and lovenox bridge for pt colonoscopy on 05/24/21.  Lovenox will be 60mg  syringe and pt will need a total of 17 syringes. Pt stopped taking her lovenox before instructed after her last procedure so she reported at last apt she did have some syringes left over. She will let the clinic know how many she has left over so the script for this order can be reduces by that number.  8/9: Check INR  8/10:Take last dose of coumadin, no lovenox 8/11: No coumadin, no lovenox 8/12: No coumadin, lovenox Q12H 8/13: No coumadin, lovenox Q12H 8/14: No coumadin, lovenox AM only  8/15: Procedure day NO coumadin, NO lovenox  8/16:Take 7.5mg  coumadin, lovenox Q12H 8/17: Take 7.5mg  coumadin, lovenox Q12H 8/18: Take 7.5mg  coumadin, lovenox Q12H 8/19: Take 7.5mg  coumadin, lovenox Q12H 8/20: Take 7.5mg  coumadin, lovenox Q12H 8/21: Take 7.5mg  coumadin, lovenox Q12H 8/22: Recheck INR in AM at clinic, hold coumadin and hold lovenox until INR check  Normal coumadin dosing for pt is currently 5 mg daily except 2.5mg  on Sun and Wed.

## 2021-04-30 ENCOUNTER — Ambulatory Visit (INDEPENDENT_AMBULATORY_CARE_PROVIDER_SITE_OTHER): Payer: Medicare Other

## 2021-04-30 DIAGNOSIS — I441 Atrioventricular block, second degree: Secondary | ICD-10-CM | POA: Diagnosis not present

## 2021-05-03 LAB — CUP PACEART REMOTE DEVICE CHECK
Battery Remaining Longevity: 54 mo
Battery Remaining Percentage: 50 %
Battery Voltage: 2.98 V
Brady Statistic AP VP Percent: 71 %
Brady Statistic AP VS Percent: 1.1 %
Brady Statistic AS VP Percent: 28 %
Brady Statistic AS VS Percent: 1 %
Brady Statistic RA Percent Paced: 36 %
Brady Statistic RV Percent Paced: 99 %
Date Time Interrogation Session: 20220722040014
Implantable Lead Implant Date: 20170907
Implantable Lead Implant Date: 20170907
Implantable Lead Location: 753859
Implantable Lead Location: 753860
Implantable Pulse Generator Implant Date: 20170907
Lead Channel Impedance Value: 400 Ohm
Lead Channel Impedance Value: 540 Ohm
Lead Channel Pacing Threshold Amplitude: 0.5 V
Lead Channel Pacing Threshold Amplitude: 0.625 V
Lead Channel Pacing Threshold Pulse Width: 0.5 ms
Lead Channel Pacing Threshold Pulse Width: 0.5 ms
Lead Channel Sensing Intrinsic Amplitude: 3.5 mV
Lead Channel Sensing Intrinsic Amplitude: 6.7 mV
Lead Channel Setting Pacing Amplitude: 0.875
Lead Channel Setting Pacing Amplitude: 2 V
Lead Channel Setting Pacing Pulse Width: 0.5 ms
Lead Channel Setting Sensing Sensitivity: 2 mV
Pulse Gen Model: 2272
Pulse Gen Serial Number: 7945283

## 2021-05-10 HISTORY — PX: COLONOSCOPY: SHX174

## 2021-05-12 NOTE — Telephone Encounter (Signed)
Spoke to patient about email.Stated she has been having strange episodes for the past 4 to 5 months in which she has a sensation that goes all over her body,light headed and she gets a chill.No chest pain.No sob.No fast heart beat.Advised to see PCP and call back if she needs to be seen.

## 2021-05-14 ENCOUNTER — Telehealth: Payer: Self-pay

## 2021-05-14 DIAGNOSIS — Z7901 Long term (current) use of anticoagulants: Secondary | ICD-10-CM

## 2021-05-14 MED ORDER — ENOXAPARIN SODIUM 60 MG/0.6ML IJ SOSY: PREFILLED_SYRINGE | INTRAMUSCULAR | 0 refills | Status: DC

## 2021-05-14 NOTE — Telephone Encounter (Signed)
Pt returned call. Advised pt she will need 18 lovenox syringes and that it would be sent in today. Advised apt will need to be pushed to 8/11. Advised to take last dose of coumadin on 8/10. Advised no lovenox until further instructions. RS pt for 8/11 and pt verbalized understanding.

## 2021-05-14 NOTE — Telephone Encounter (Signed)
LVM

## 2021-05-18 ENCOUNTER — Ambulatory Visit: Payer: Medicare Other

## 2021-05-20 ENCOUNTER — Ambulatory Visit (INDEPENDENT_AMBULATORY_CARE_PROVIDER_SITE_OTHER): Payer: Medicare Other

## 2021-05-20 ENCOUNTER — Other Ambulatory Visit: Payer: Self-pay

## 2021-05-20 DIAGNOSIS — Z7901 Long term (current) use of anticoagulants: Secondary | ICD-10-CM | POA: Diagnosis not present

## 2021-05-20 LAB — POCT INR: INR: 1.2 — AB (ref 2.0–3.0)

## 2021-05-20 NOTE — Patient Instructions (Addendum)
Pre visit review using our clinic review tool, if applicable. No additional management support is needed unless otherwise documented below in the visit note.  8/10:Take last dose of coumadin, no lovenox 8/11: No coumadin, no lovenox 8/12: No coumadin, lovenox Q12H 8/13: No coumadin, lovenox Q12H 8/14: No coumadin, lovenox AM only   8/15: Procedure day NO coumadin, NO lovenox   8/16:Take 7.'5mg'$  coumadin, lovenox Q12H 8/17: Take 7.'5mg'$  coumadin, lovenox Q12H 8/18: Take 7.'5mg'$  coumadin, lovenox Q12H 8/19: Take 7.'5mg'$  coumadin, lovenox Q12H 8/20: Take 7.'5mg'$  coumadin, lovenox Q12H 8/21: Take 7.'5mg'$  coumadin, lovenox Q12H 8/22: Recheck INR in AM at clinic, hold coumadin and hold lovenox until INR check

## 2021-05-21 ENCOUNTER — Other Ambulatory Visit: Payer: Self-pay | Admitting: Family Medicine

## 2021-05-21 NOTE — Telephone Encounter (Signed)
Note attached states med was discontinued on 03/18/21 by Bridgette Habermann, CPhT due to pt preference.    Spoke with pt asking about request.  States she just resumed med about 2 wks ago due to trouble sleeping.   Name of Medication: Temazepam Name of Pharmacy: [facility] [place] or Written Date and Quantity: 06/10/20, #30 Last Office Visit and Type: 03/19/21, L hip pain Next Office Visit and Type: 06/16/21, AWV prt 2 Last Controlled Substance Agreement Date: none Last UDS: none

## 2021-05-21 NOTE — Telephone Encounter (Signed)
ERx 

## 2021-05-23 NOTE — Progress Notes (Signed)
Agree. Thanks

## 2021-05-24 ENCOUNTER — Encounter: Payer: Self-pay | Admitting: Gastroenterology

## 2021-05-24 ENCOUNTER — Other Ambulatory Visit: Payer: Self-pay

## 2021-05-24 ENCOUNTER — Ambulatory Visit (AMBULATORY_SURGERY_CENTER): Payer: Medicare Other | Admitting: Gastroenterology

## 2021-05-24 VITALS — BP 122/62 | HR 76 | Temp 97.4°F | Resp 15 | Ht 64.0 in | Wt 139.0 lb

## 2021-05-24 DIAGNOSIS — Z85038 Personal history of other malignant neoplasm of large intestine: Secondary | ICD-10-CM

## 2021-05-24 DIAGNOSIS — D124 Benign neoplasm of descending colon: Secondary | ICD-10-CM

## 2021-05-24 MED ORDER — SODIUM CHLORIDE 0.9 % IV SOLN: 500.0000 mL | Freq: Once | INTRAVENOUS | Status: DC
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
## 2021-05-24 NOTE — Patient Instructions (Signed)
Handout provided on diverticulosis.   Continue present medications. OK to resume your blood thinners per 'coumadin clinic' recommendations.   Repeat colonoscopy in 5 years for surveillance.   YOU HAD AN ENDOSCOPIC PROCEDURE TODAY AT [facility]:   Refer to the procedure report that was given to you for any specific questions about what was found during the examination.  If the procedure report does not answer your questions, please call your gastroenterologist to clarify.  If you requested that your care partner not be given the details of your procedure findings, then the procedure report has been included in a sealed envelope for you to review at your convenience later.  YOU SHOULD EXPECT: Some feelings of bloating in the abdomen. Passage of more gas than usual.  Walking can help get rid of the air that was put into your GI tract during the procedure and reduce the bloating. If you had a lower endoscopy (such as a colonoscopy or flexible sigmoidoscopy) you may notice spotting of blood in your stool or on the toilet paper. If you underwent a bowel prep for your procedure, you may not have a normal bowel movement for a few days.  Please Note:  You might notice some irritation and congestion in your nose or some drainage.  This is from the oxygen used during your procedure.  There is no need for concern and it should clear up in a day or so.  SYMPTOMS TO REPORT IMMEDIATELY:  Following lower endoscopy (colonoscopy or flexible sigmoidoscopy):  Excessive amounts of blood in the stool  Significant tenderness or worsening of abdominal pains  Swelling of the abdomen that is new, acute  Fever of 100F or higher  For urgent or emergent issues, a gastroenterologist can be reached at any hour by calling [phone]. Do not use MyChart messaging for urgent concerns.    DIET:  We do recommend a small meal at first, but then you may proceed to your regular diet.  Drink plenty of fluids  but you should avoid alcoholic beverages for 24 hours.  ACTIVITY:  You should plan to take it easy for the rest of today and you should NOT DRIVE or use heavy machinery until tomorrow (because of the sedation medicines used during the test).    FOLLOW UP: Our staff will call the number listed on your records 48-72 hours following your procedure to check on you and address any questions or concerns that you may have regarding the information given to you following your procedure. If we do not reach you, we will leave a message.  We will attempt to reach you two times.  During this call, we will ask if you have developed any symptoms of COVID 19. If you develop any symptoms (ie: fever, flu-like symptoms, shortness of breath, cough etc.) before then, please call [phone].  If you test positive for Covid 19 in the 2 weeks post procedure, please call and report this information to [place].    If any biopsies were taken you will be contacted by phone or by letter within the next 1-3 weeks.  Please call us at [phone] if you have not heard about the biopsies in 3 weeks.    SIGNATURES/CONFIDENTIALITY: You and/or your care partner have signed paperwork which will be entered into your electronic medical record.  These signatures attest to the fact that that the information above on your After Visit Summary has been reviewed and is understood.  Full responsibility of the confidentiality  of this discharge information lies with you and/or your care-partner.

## 2021-05-24 NOTE — Op Note (Signed)
[place] Patient Name: [name] Procedure Date: 05/24/2021 8:39 AM MRN: [id number] Endoscopist: [name] , MD Age: 75 Referring MD:  Date of Birth: [birth year] Gender: Female Account #: [id number] Procedure:                Colonoscopy Indications:              High risk colon cancer surveillance: Personal                            history of colon cancer; Rectal cancer,midrectum                            ypT3ypN0, s/p neoadj chemoXRT, lap LAR with                            diverting loop ileostomy 08/2011, ileostomy                            takedown 03/2012. Colonoscopy 03/2013 found 1 small                            TA (next recall 3 years), LAR anastomosis was a bit                            narrowed, edematous and dilated to 2cm with                            balloon. Colonoscopy August 2017 showing normal                            colocolonic anastomosis in the distal colon, the                            examination was otherwise normal Medicines:                Monitored Anesthesia Care Procedure:                Pre-Anesthesia Assessment:                           - Prior to the procedure, a History and Physical                            was performed, and patient medications and                            allergies were reviewed. The patient's tolerance of                            previous anesthesia was also reviewed. The risks                            and benefits of the procedure and the sedation  options and risks were discussed with the patient.                            All questions were answered, and informed consent                            was obtained. Prior Anticoagulants: The patient has                            taken Coumadin (warfarin), last dose was 5 days                            prior to procedure. Lovenox last shot was yesterday                            AM. ASA Grade Assessment: III -  A patient with                            severe systemic disease. After reviewing the risks                            and benefits, the patient was deemed in                            satisfactory condition to undergo the procedure.                            After obtaining informed consent, the colonoscope                            was passed under direct vision. Throughout the                            procedure, the patient's blood pressure, pulse, and                            oxygen saturations were monitored continuously. The                            Olympus PCF-H190DL (#[id number])Colonoscope was                            introduced through the anus and advanced to the the                            cecum, identified by appendiceal orifice and                            ileocecal valve. The colonoscopy was performed                            without difficulty. The patient tolerated the  procedure well. The quality of the bowel                            preparation was good. The ileocecal valve,                            appendiceal orifice, and rectum were photographed. Scope In: 8:44:11 AM Scope Out: 8:57:43 AM Scope Withdrawal Time: 0 hours 9 minutes 34 seconds  Total Procedure Duration: 0 hours 13 minutes 32 seconds  Findings:                 Multiple small and large-mouthed diverticula were                            found in the left colon.                           Colo-colonic LAR anastomosis was edematous and a                            bit narrowed but otherwise normal appearing.                           The exam was otherwise without abnormality. Complications:            No immediate complications. Estimated blood loss:                            None. Estimated Blood Loss:     Estimated blood loss: none. Impression:               - Diverticulosis in the left colon.                           - Colo-colonic LAR anastomosis was  edematous and a                            bit narrowed but otherwise normal appearing.                           - No polyps or cancers. Recommendation:           - Patient has a contact number available for                            emergencies. The signs and symptoms of potential                            delayed complications were discussed with the                            patient. Return to normal activities tomorrow.                            Written discharge instructions were provided to the  patient.                           - Resume previous diet.                           - Continue present medications. OK to resume your                            blood thinners per 'coumadin clinic'                            recommendations.                           - Repeat colonoscopy in 5 years for surveillance. [name], MD 05/24/2021 9:02:18 AM This report has been signed electronically.

## 2021-05-24 NOTE — Progress Notes (Signed)
VS taken by C.W. 

## 2021-05-24 NOTE — Progress Notes (Signed)
Report given to PACU, vss 

## 2021-05-25 NOTE — Progress Notes (Signed)
Remote pacemaker transmission.   

## 2021-05-26 ENCOUNTER — Telehealth: Payer: Self-pay

## 2021-05-26 ENCOUNTER — Other Ambulatory Visit: Payer: Self-pay | Admitting: Family Medicine

## 2021-05-26 NOTE — Telephone Encounter (Signed)
Name of Medication: Tramadol Name of Pharmacy: [facility] [place] or Written Date and Quantity: 04/28/21, #30 ref 0 Last Office Visit and Type: 03/19/21, L hip pain Next Office Visit and Type: 06/16/21, AWV prt 2 Last Controlled Substance Agreement Date: none Last UDS: none

## 2021-05-26 NOTE — Telephone Encounter (Signed)
  Follow up Call-  Call back number 05/24/2021  Post procedure Call Back phone  # [phone]  Permission to leave phone message Yes  Some recent data might be hidden     Patient questions:  Do you have a fever, pain , or abdominal swelling? No. Pain Score  0 *  Have you tolerated food without any problems? Yes.    Have you been able to return to your normal activities? Yes.    Do you have any questions about your discharge instructions: Diet   No. Medications  No. Follow up visit  No.  Do you have questions or concerns about your Care? No.  Actions: * If pain score is 4 or above: No action needed, pain <4.

## 2021-05-26 NOTE — Telephone Encounter (Signed)
No answer, unable to lm

## 2021-05-27 ENCOUNTER — Telehealth: Payer: Self-pay

## 2021-05-27 LAB — ACID FAST CULTURE WITH REFLEXED SENSITIVITIES (MYCOBACTERIA)
Acid Fast Culture: NEGATIVE
Acid Fast Culture: NEGATIVE

## 2021-05-27 NOTE — Telephone Encounter (Addendum)
Recommend push water over next 24 hours.  If recurrent blood in urine, recommend coming in this week for lab visit for INR and not waiting until Monday.  Let us know if new symptoms develop.

## 2021-05-27 NOTE — Telephone Encounter (Signed)
ERx 

## 2021-05-27 NOTE — Telephone Encounter (Signed)
Patient had colonoscopy on 05/24/2021. Started on Lovenox injections '60mg'$  bid on 05/21/2021 to 8/212022. Restarted coumadin 05/25/2021 at 7.'5mg'$  daily. Has appointment to check INR 05/31/2021 Today noticed some light red blood in urine did not feel like it was a large amount at all. Denies any unusual bruising  or bleeding. Has only had one bowel movement after procedure but did not notice any blood in stool. Denies any painful, frequency or pressure with urination. Note seen to Dr. Darnell Level and [name] for review.

## 2021-05-27 NOTE — Telephone Encounter (Signed)
Spoke with pt relaying Dr. G's message. Pt verbalizes understanding.  

## 2021-05-28 ENCOUNTER — Ambulatory Visit (INDEPENDENT_AMBULATORY_CARE_PROVIDER_SITE_OTHER): Payer: Medicare Other | Admitting: Diagnostic Neuroimaging

## 2021-05-28 ENCOUNTER — Encounter: Payer: Self-pay | Admitting: Diagnostic Neuroimaging

## 2021-05-28 VITALS — BP 114/61 | HR 85 | Ht 64.0 in | Wt 137.8 lb

## 2021-05-28 DIAGNOSIS — R413 Other amnesia: Secondary | ICD-10-CM

## 2021-05-28 DIAGNOSIS — G453 Amaurosis fugax: Secondary | ICD-10-CM

## 2021-05-28 DIAGNOSIS — R269 Unspecified abnormalities of gait and mobility: Secondary | ICD-10-CM

## 2021-05-28 NOTE — Patient Instructions (Signed)
LEFT EYE VISION LOSS (since 2021) - check CTA head / neck - continue warfarin, statin  MEMORY LOSS (medium-long term memories; no changes in daily activities) - likely related to anxiety, insomnia, pain; monitor  GAIT DIFFICULTY (due to multi-level lumbar spine disease; also DISH) - continue PT evaluations; use cane / walker

## 2021-05-28 NOTE — Progress Notes (Signed)
[facility]  PATIENT: [name] DOB: [birth year]   REFERRING CLINICIAN: Ria Bush, MD HISTORY FROM: patient  REASON FOR VISIT: new consult   HISTORICAL  CHIEF COMPLAINT:  Chief Complaint  Patient presents with   New Patient (Initial Visit)    Rm 7, alone. Internal referral for balance and vision changes. Pt states she is here for memory concerns. Pt states she has had situations where she is unable to remember full events and not just pieces.     HISTORY OF PRESENT ILLNESS:   75 year old female with history of chronic diastolic heart failure, rheumatic valvular disease, aortic and mitral valve replacement, COPD, hyperlipidemia, pacemaker, atrial flutter, here for evaluation of transient left eye visual loss, memory loss, gait difficulty.  Patient has had intermittent left visual loss lasting few minutes at a time over the past 1 year.  Previously this happened in timeframe subtherapeutic INR.  Patient has been evaluated by ophthalmology as well.  Last episode happened 2 to 3 weeks ago.  Patient also having some concerns around memory loss, related to forgetting events that occurred 6 months for several years after.  During conversations with friends where she is notable to recall events that they experienced together.  Patient does not have any short-term memory loss problems.  No changes in ADLs.  She is able to maintain her own personal hygiene, bathing, dressing, household chores, shopping and finances.  Her son passed away in 2014/01/01 as husband passed away 6 months later 01-02-2015.  He had significant memory lapse and depression at that time but has improved.  She does worry about long-term care planning and health.  She has chronic insomnia and difficulty sleeping.  She is also having some issues with anxiety.  She may have some mild depression symptoms as well.  Patient also having some intermittent gait and balance difficulty.  She has chronic low  back pain, multilevel degenerative spine disease in the cervical, thoracic and lumbar spine, managed conservatively by neurosurgery.  She has a cane which she uses at times.  She is able to use a shopping cart in stores for balance.  She has frequent falls and balance issues.    REVIEW OF SYSTEMS: Full 14 system review of systems performed and negative with exception of: as per HPI.  ALLERGIES: Allergies  Allergen Reactions   Codeine Nausea Only   Sulfa Antibiotics Nausea Only   Sulfonamide Derivatives Nausea Only    HOME MEDICATIONS: Outpatient Medications Prior to Visit  Medication Sig Dispense Refill   acetaminophen (TYLENOL) 500 MG tablet Take 2 tablets (1,000 mg total) by mouth daily.     albuterol (VENTOLIN HFA) 108 (90 Base) MCG/ACT inhaler Inhale 2 puffs into the lungs every 6 (six) hours as needed for wheezing. 18 g 6   amoxicillin (AMOXIL) 500 MG capsule TAKE 4 CAPSULES BY MOUTH 30-60 MINUTES PRIOR TO APPOINTMENT 4 capsule 5   Biotin 1000 MCG tablet Take 1,000 mcg by mouth daily.     calcium carbonate (OSCAL) 1500 (600 Ca) MG TABS tablet Take 600 mg of elemental calcium by mouth daily.     carboxymethylcellulose (REFRESH PLUS) 0.5 % SOLN Place 1 drop into both eyes 3 (three) times daily as needed (dry eyes).     cephALEXin (KEFLEX) 250 MG capsule Take 250 mg by mouth daily.     Cholecalciferol (VITAMIN D) 50 MCG (2000 UT) tablet Take 2,000 Units by mouth daily.     docusate sodium (COLACE) 100 MG capsule Take  200 mg by mouth every evening.     enoxaparin (LOVENOX) 60 MG/0.6ML injection Inject 0.37ms ('60mg'$ )  into the skin every 12 hours for 18 doses as directed by coumadin clinic. 10.8 mL 0   furosemide (LASIX) 40 MG tablet TAKE 1 TABLET(40 MG) BY MOUTH DAILY 30 tablet 11   gabapentin (NEURONTIN) 300 MG capsule Take 300 mg by mouth at bedtime.     GEMTESA 75 MG TABS Take 75 mg by mouth daily.     ipratropium-albuterol (DUONEB) 0.5-2.5 (3) MG/3ML SOLN Take 3 mLs by nebulization  every 6 (six) hours as needed. 360 mL 0   lovastatin (MEVACOR) 40 MG tablet TAKE 1 TABLET(40 MG) BY MOUTH AT BEDTIME 90 tablet 3   montelukast (SINGULAIR) 10 MG tablet TAKE 1 TABLET(10 MG) BY MOUTH AT BEDTIME 30 tablet 11   Multiple Vitamins-Minerals (PRESERVISION AREDS 2 PO) Take 2 capsules by mouth every evening.      polyethylene glycol (MIRALAX / GLYCOLAX) packet Take 17 g by mouth daily. 14 each 0   potassium chloride (KLOR-CON) 10 MEQ tablet TAKE 2 TABLETS(20 MEQ) BY MOUTH DAILY 60 tablet 9   PSYLLIUM PO Take 1 capsule by mouth daily.     silver sulfADIAZINE (SILVADENE) 1 % cream Apply 1 application topically daily. 50 g 1   temazepam (RESTORIL) 7.5 MG capsule TAKE 1 CAPSULE(7.5 MG) BY MOUTH AT BEDTIME AS NEEDED FOR SLEEP 30 capsule 0   traMADol (ULTRAM) 50 MG tablet TAKE 1 TABLET(50 MG) BY MOUTH TWICE DAILY AS NEEDED FOR MODERATE PAIN 30 tablet 0   TRELEGY ELLIPTA 200-62.5-25 MCG/INH AEPB INHALE 1 PUFF INTO THE LUNGS DAILY 60 each 5   vitamin B-12 (CYANOCOBALAMIN) 500 MCG tablet Take 500 mcg by mouth every other day.      warfarin (COUMADIN) 5 MG tablet TAKE 1 TO 2 TABLETS BY MOUTH DAILY AS DIRECTED BY COUMADIN CLINIC 60 tablet 5   Zinc 50 MG CAPS Take 1 capsule (50 mg total) by mouth daily.  0   ciprofloxacin (CIPRO) 500 MG tablet Take 1 tablet (500 mg total) by mouth 2 (two) times daily. (Patient not taking: Reported on 05/24/2021) 28 tablet 0   Facility-Administered Medications Prior to Visit  Medication Dose Route Frequency Provider Last Rate Last Admin   ondansetron (ZOFRAN) 4 mg in sodium chloride 0.9 % 50 mL IVPB  4 mg Intravenous Q6H PRN EKristeen Miss MD        PAST MEDICAL HISTORY: Past Medical History:  Diagnosis Date   Acute cystitis without hematuria 12/22/2015   Anemia    Arthritis    CAP (community acquired pneumonia) 02/08/2016   CAP (community acquired pneumonia) 02/08/2016   Cataracts, bilateral    immature   Chronic cholecystitis with calculus 01/21/2015   S/p  cholecystectomy    Chronic diastolic heart failure ([facility] 09/19/2014   Chronic insomnia    Constipation    takes Miralax daily as needed   COPD (chronic obstructive pulmonary disease) (HCC)    Albuterol inhaler daily as needed;Duoneb daily as needed;Spiriva daily   DDD (degenerative disc disease)    cervical - kyphosis with mod DD changes C5/6 and C6/7; lumbar - early DD at L2/3 ([place])   Dyspnea    with exertion   Essential hypertension    was on meds but after appointment Dec 11 with Dr.C he took her off   GERD (gastroesophageal reflux disease)    takes Protonix daily   History of pneumonia 02/16/2016   History of radiation  therapy 05/30/11 to 07/07/11   rectum   History of shingles    Hyperlipemia    takes Lovastatin daily   Legally blind in left eye, as defined in [place]    Lung nodule    RLL nodule-47m stable 2006, April 2009, and June 2009   Obesity    Osteopenia 12/2014   T -1.5 hip   Pancreatitis 11/2014   ?zpack related vs gallstone pancreatitis with abnormal HIDA scan pending cholecystectomy   PONV (postoperative nausea and vomiting)    Presence of permanent cardiac pacemaker    Rectal cancer ([facility] 04/2011   T3N0; s/p lap LAR, s/p ileostomy, s/p reversal, s/p chemo   Research study patient 08/26/2016   Rheumatic heart disease mitral stenosis    mod MS by echo 02/2014   S/P AVR (aortic valve replacement) 2015   bioprosthetic ([place])   S/P MVR (mitral valve replacement) 2015   bioprosthetic ([place])   Seizures (HCC)    febrile seizures as a child   Sepsis secondary to UTI ([facility] 08/22/2017   Severe aortic valve stenosis  AVR 3/15 10/01/2013   mild-mod by echo 02/2014   Sleep apnea    PT TOLD BORDERLINE-TRIED CPAP-DID NOT HELP-SHE DOES NOT USE CPAP   Small bowel obstruction, partial ([facility] 08/2013   reolved without NGT placement.    Vitamin B12 deficiency 02/28/2015   Start B12 shots 02/2015     PAST SURGICAL HISTORY: Past Surgical History:  Procedure Laterality Date    ANTERIOR LAT LUMBAR FUSION Left 07/17/2018   Procedure: Lumbar Two-Three Lumbar Three-Four Anterolateral decompression/interbody fusion with lateral plate fixation/Infuse;  Surgeon: EKristeen Miss MD;  [facility]  Service: Neurosurgery;  Laterality: Left;  Lumbar Two-Three Lumbar Three-Four Anterolateral decompression/interbody fusion with lateral plate fixation/Infuse   AORTIC VALVE REPLACEMENT N/A 12/12/2013   Procedure: AORTIC VALVE REPLACEMENT (AVR);  Surgeon: BGaye Pollack MD; Service: Open Heart Surgery   BACK SURGERY  2006, 2007   2006 SPACER, 2007 decompression and fusion L4/5   BOWEL RESECTION  04/02/2012   Procedure: SMALL BOWEL RESECTION;  Surgeon: FStark Klein MD;  [facility]: [facility];  Service: General;  Laterality: N/A;   BRONCHIAL WASHINGS  03/24/2021   Procedure: BRONCHIAL WASHINGS;  Surgeon: RBrand Males MD;  [facility]: [facility];  Service: Endoscopy;;   CARPAL TUNNEL RELEASE Bilateral    CATARACT EXTRACTION W/ INTRAOCULAR LENS IMPLANT Left 10/18/2016   CATARACT EXTRACTION W/ INTRAOCULAR LENS IMPLANT Right 12/13/2016   Dr. SKathrin Penner  CHOLECYSTECTOMY N/A 01/21/2015   chronic cholecystitis, FStark Klein MD   COLON RESECTION  08/25/2011   Procedure: COLON RESECTION LAPAROSCOPIC;  Surgeon: FStark Klein MD;  [facility]: [facility];  Service: General;  Laterality: N/A;  Laparoscopic Assisted Low Anterior Resection Diverting Ostomy and onQ pain pump   COLONOSCOPY  03/2013   1 polyp, rpt 3 yrs (Ardis Hughs   COLONOSCOPY WITH PROPOFOL N/A 06/09/2016   patent colo-colonic anastomosis, rpt 5 yrs (Ardis Hughs   EP IMPLANTABLE DEVICE N/A 06/16/2016   Procedure: Pacemaker Implant;  Surgeon: Will MMeredith Leeds MD;  [facility];  Service: Cardiovascular;  Laterality: N/A;   FOOT SURGERY  left foot   hammer toe   ILEOSTOMY  08/25/2011   ILEOSTOMY CLOSURE  04/02/2012   Procedure: ILEOSTOMY TAKEDOWN;  Surgeon: FStark Klein MD;  [facility]: [facility];  Service: General;   Laterality: N/A;   INTRAOPERATIVE TRANSESOPHAGEAL ECHOCARDIOGRAM N/A 12/12/2013   Procedure: INTRAOPERATIVE TRANSESOPHAGEAL ECHOCARDIOGRAM;  Surgeon: BGaye Pollack MD;  [facility];  Service: Open Heart Surgery;  Laterality: N/A;   LEFT AND RIGHT HEART CATHETERIZATION WITH CORONARY ANGIOGRAM N/A 11/27/2013   Procedure: LEFT AND RIGHT HEART CATHETERIZATION WITH CORONARY ANGIOGRAM;  Surgeon: Sanda Klein, MD;  Location: [facility];  Service: Cardiovascular;  Laterality: N/A;   MITRAL VALVE REPLACEMENT N/A 12/12/2013   Procedure: MITRAL VALVE (MV) REPLACEMENT OR REPAIR;  Surgeon: Gaye Pollack, MD; Service: Open Heart Surgery   TEE WITHOUT CARDIOVERSION N/A 11/29/2013   Procedure: TRANSESOPHAGEAL ECHOCARDIOGRAM (TEE);  Surgeon: Sueanne Margarita, MD;  [facility];  Service: Cardiovascular;  Laterality: N/A;   TEE WITHOUT CARDIOVERSION N/A 06/04/2019   Procedure: TRANSESOPHAGEAL ECHOCARDIOGRAM (TEE);  Surgeon: Sanda Klein, MD;  Location: [facility];  Service: Cardiovascular;  Laterality: N/A;   TONSILLECTOMY     VIDEO BRONCHOSCOPY N/A 03/24/2021   Procedure: VIDEO BRONCHOSCOPY WITHOUT FLUORO;  Surgeon: Brand Males, MD;  [facility];  Service: Endoscopy;  Laterality: N/A;  SMALL SCOPE,PREMEDICATE WITH NEBULIZED LIDOCAINE    FAMILY HISTORY: Family History  Problem Relation Age of Onset   Heart disease Mother    Diabetes Mother    Hypertension Mother    Stroke Mother    Lung cancer Father    Breast cancer Maternal Aunt 83   Heart attack Maternal Grandfather    CAD Son 78   Heart attack Son 28   Rectal cancer Neg Hx    Stomach cancer Neg Hx    Esophageal cancer Neg Hx    Colon cancer Neg Hx     SOCIAL HISTORY: Social History   Socioeconomic History   Marital status: Widowed    Spouse name: Not on file   Number of children: 2   Years of education: Not on file   Highest education level: Some college, no degree  Occupational History    Occupation: retired  Tobacco Use   Smoking status: Former    Packs/day: 1.50    Years: 10.00    Pack years: 15.00    Types: Cigarettes    Quit date: 12/28/1974    Years since quitting: 46.4   Smokeless tobacco: Never   Tobacco comments:    quit smoking in 1976  Vaping Use   Vaping Use: Never used  Substance and Sexual Activity   Alcohol use: Not Currently    Alcohol/week: 2.0 standard drinks    Types: 2 Glasses of wine per week    Comment: very rare   Drug use: No   Sexual activity: Not Currently  Other Topics Concern   Not on file  Social History Narrative   Widow. Husband died April 05, 2015 MVA. 1 cat.    2 sons; 1 grandchild. One son died of MI 90   Edu: Completed HS, some college   Married 1966   Retired-[employer], volunteers at [employer]   Activity: pulm rehab   Diet: some water, fruits/vegetables daily      Right handed   Caffeine: 4 glasses of tea and 1 diet coke a day   Social Determinants of Radio broadcast assistant Strain: Not on file  Food Insecurity: Not on file  Transportation Needs: Not on file  Physical Activity: Not on file  Stress: Not on file  Social Connections: Not on file  Intimate Partner Violence: Not on file     PHYSICAL EXAM  GENERAL EXAM/CONSTITUTIONAL: Vitals:  Vitals:   05/28/21 0834  BP: 114/61  Pulse: 85  Weight: 137 lb 12.8 oz (62.5 kg)  Height: '5\' 4"'$  (1.626 m)  Body mass index is 23.65 kg/m. Wt Readings from Last 3 Encounters:  05/28/21 137 lb 12.8 oz (62.5 kg)  05/24/21 139 lb (63 kg)  04/14/21 140 lb 9.6 oz (63.8 kg)   Patient is in no distress; well developed, nourished and groomed; neck is supple  CARDIOVASCULAR: Examination of carotid arteries is normal; no carotid bruits Regular rate and rhythm, no murmurs Examination of peripheral vascular system by observation and palpation is normal  EYES: Ophthalmoscopic exam of optic discs and posterior segments is normal; no papilledema or hemorrhages Vision Screening   Right  eye Left eye Both eyes  Without correction 20/50  20/50  With correction     Comments: 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, unable to read anything  MUSCULOSKELETAL: Gait, strength, tone, movements noted in Neurologic exam below  NEUROLOGIC: MENTAL STATUS:  MMSE - [place] Exam 05/28/2021 11/15/2017 11/07/2016  Orientation to time '5 5 5  '$ Orientation to Place '5 5 5  '$ Registration '3 3 3  '$ Attention/ Calculation 5 0 0  Recall '3 3 3  '$ Language- name 2 objects 2 0 0  Language- repeat '1 1 1  '$ Language- follow 3 step command '3 3 3  '$ Language- read & follow direction 1 0 0  Write a sentence 1 0 0  Copy design 1 0 0  Total score '30 20 20   '$ awake, alert, oriented to person, place and time recent and remote memory intact normal attention and concentration language fluent, comprehension intact, naming intact fund of knowledge appropriate  CRANIAL NERVE:  2nd - no papilledema on fundoscopic exam 2nd, 3rd, 4th, 6th - pupils equal and reactive to light, visual fields full to confrontation, extraocular muscles intact, no nystagmus 5th - facial sensation symmetric 7th - facial strength symmetric 8th - hearing intact 9th - palate elevates symmetrically, uvula midline 11th - shoulder shrug symmetric 12th - tongue protrusion midline  MOTOR:  normal bulk and tone, full strength in the BUE, BLE  SENSORY:  normal and symmetric to light touch, temperature, vibration; EXCEPT SLIGHT DECR VIB IN FEET  COORDINATION:  finger-nose-finger, fine finger movements normal  REFLEXES:  deep tendon reflexes TRACE and symmetric  GAIT/STATION:  WIDE GAIT; UNSTEADY; STUMBLES SEVERAL TIMES WHILE TRYING TO STAND; DOES NOT HAVE CANE     DIAGNOSTIC DATA (LABS, IMAGING, TESTING) - I reviewed patient records, labs, notes, testing and imaging myself where available.  Lab Results  Component Value Date   WBC 5.2 06/05/2020   HGB 14.3 06/05/2020   HCT 42.5 06/05/2020   MCV 103.4 (H) 06/05/2020   PLT 128.0  (L) 06/05/2020      Component Value Date/Time   NA 142 01/08/2021 0838   NA 142 05/31/2019 1014   NA 143 07/28/2014 0848   K 3.8 01/08/2021 0838   K 3.6 07/28/2014 0848   CL 102 01/08/2021 0838   CL 104 11/15/2012 0908   CO2 32 01/08/2021 0838   CO2 30 (H) 07/28/2014 0848   GLUCOSE 92 01/08/2021 0838   GLUCOSE 96 07/28/2014 0848   GLUCOSE 93 11/15/2012 0908   BUN 21 01/08/2021 0838   BUN 18 05/31/2019 1014   BUN 19.7 07/28/2014 0848   CREATININE 0.81 01/08/2021 0838   CREATININE 1.2 (H) 07/28/2014 0848   CALCIUM 9.4 01/08/2021 0838   CALCIUM 10.0 07/28/2014 0848   PROT 6.3 06/05/2020 0809   PROT 6.4 10/04/2016 0000   PROT 6.9 11/15/2012 0908   ALBUMIN 3.5 06/05/2020 0809   ALBUMIN  4.0 10/04/2016 0000   ALBUMIN 3.3 (L) 11/15/2012 0908   AST 25 06/05/2020 0809   AST 17 11/15/2012 0908   ALT 20 06/05/2020 0809   ALT 12 11/15/2012 0908   ALKPHOS 74 06/05/2020 0809   ALKPHOS 79 11/15/2012 0908   BILITOT 0.7 06/05/2020 0809   BILITOT 0.7 10/04/2016 0000   BILITOT 0.60 11/15/2012 0908   GFRNONAA 71 05/31/2019 1014   GFRAA 82 05/31/2019 1014   Lab Results  Component Value Date   CHOL 152 06/05/2020   HDL 61.70 06/05/2020   LDLCALC 77 06/05/2020   LDLDIRECT 111.0 06/26/2015   TRIG 65.0 06/05/2020   CHOLHDL 2 06/05/2020   Lab Results  Component Value Date   HGBA1C 5.5 12/06/2013   Lab Results  Component Value Date   [id number] 06/05/2020   Lab Results  Component Value Date   TSH 2.08 01/08/2021    05/08/19 CT cervical / thoracic / lumbar 1. Mixed injection limits contrast on traditional myelographic images. The patient was unable to stand. 2. Solid fusion at L2-3, L3-4, and L4-5. 3. Severe left foraminal stenosis at L3-4 with moderate left subarticular narrowing. 4. Moderate facet hypertrophy at L5-S1 with mild right foraminal narrowing. 5. 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 6. Disc protrusions at T7-8  and T9-10 partially indents the ventral CSF without significant stenosis. 7. Asymmetric mild left foraminal narrowing at T1-2. 8. Mild left foraminal narrowing at C2-3. 9. Mild right foraminal narrowing at C3-4. 10. Moderate left and mild right foraminal narrowing at C4-5. 11. Moderate left and mild right foraminal narrowing at C5-6. 12. Mild foraminal narrowing at C6-7. 13. Mild left foraminal narrowing at C7-T1.   ASSESSMENT AND PLAN  75 y.o. year old female here with:   Dx:  1. Memory loss   2. Amaurosis fugax of left eye   3. Gait difficulty       PLAN:  LEFT EYE VISION LOSS (intermittent since 2021) - check CTA head / neck (TIA evaluation) - continue warfarin, statin  MEMORY LOSS (medium-long term memories; no changes in ADLs) - likely related to anxiety, insomnia; monitor  GAIT DIFFICULTY (due to multi-level lumbar spine dz; also DISH) - continue PT evaluations; use cane / walker  Orders Placed This Encounter  Procedures   CT ANGIO HEAD W OR WO CONTRAST   CT ANGIO NECK W OR WO CONTRAST   Return for pending if symptoms worsen or fail to improve, pending test results.    Penni Bombard, MD [id number], [id number] AM Certified in Neurology, Neurophysiology and Neuroimaging  [facility] [address], [place], [place] [address] [phone]

## 2021-05-31 ENCOUNTER — Other Ambulatory Visit: Payer: Self-pay

## 2021-05-31 ENCOUNTER — Telehealth: Payer: Self-pay | Admitting: Diagnostic Neuroimaging

## 2021-05-31 ENCOUNTER — Ambulatory Visit (INDEPENDENT_AMBULATORY_CARE_PROVIDER_SITE_OTHER): Payer: Medicare Other

## 2021-05-31 ENCOUNTER — Ambulatory Visit: Payer: Medicare Other | Admitting: Family Medicine

## 2021-05-31 DIAGNOSIS — Z7901 Long term (current) use of anticoagulants: Secondary | ICD-10-CM | POA: Diagnosis not present

## 2021-05-31 LAB — POCT INR: INR: 1.6 — AB (ref 2.0–3.0)

## 2021-05-31 NOTE — Patient Instructions (Addendum)
Pre visit review using our clinic review tool, if applicable. No additional management support is needed unless otherwise documented below in the visit note.   Take 2.'5mg'$  today and lovenox, and then take '5mg'$  tomorrow evening if no bleeding from procedure, and then take 10 mg on Wed and take 10 mg on Thurs and recheck INR on Fri, 8/26.

## 2021-05-31 NOTE — Telephone Encounter (Signed)
[employer] medicare no auth require. Patient is scheduled at GI for 06/18/21.

## 2021-06-04 ENCOUNTER — Other Ambulatory Visit: Payer: Self-pay

## 2021-06-04 ENCOUNTER — Ambulatory Visit (INDEPENDENT_AMBULATORY_CARE_PROVIDER_SITE_OTHER): Payer: Medicare Other

## 2021-06-04 DIAGNOSIS — Z7901 Long term (current) use of anticoagulants: Secondary | ICD-10-CM | POA: Diagnosis not present

## 2021-06-04 LAB — POCT INR: INR: 1.3 — AB (ref 2.0–3.0)

## 2021-06-04 NOTE — Patient Instructions (Addendum)
Pre visit review using our clinic review tool, if applicable. No additional management support is needed unless otherwise documented below in the visit note.  Increase dose today, tomorrow, Sun and Mon to 7.'5mg'$  and recheck INR on Wed 8/31 when you have other labs done.

## 2021-06-06 ENCOUNTER — Other Ambulatory Visit: Payer: Self-pay | Admitting: Family Medicine

## 2021-06-06 DIAGNOSIS — E785 Hyperlipidemia, unspecified: Secondary | ICD-10-CM

## 2021-06-06 DIAGNOSIS — E538 Deficiency of other specified B group vitamins: Secondary | ICD-10-CM

## 2021-06-06 DIAGNOSIS — D696 Thrombocytopenia, unspecified: Secondary | ICD-10-CM

## 2021-06-06 DIAGNOSIS — E559 Vitamin D deficiency, unspecified: Secondary | ICD-10-CM

## 2021-06-06 DIAGNOSIS — I10 Essential (primary) hypertension: Secondary | ICD-10-CM

## 2021-06-09 ENCOUNTER — Other Ambulatory Visit: Payer: Self-pay

## 2021-06-09 ENCOUNTER — Other Ambulatory Visit (INDEPENDENT_AMBULATORY_CARE_PROVIDER_SITE_OTHER): Payer: Medicare Other

## 2021-06-09 ENCOUNTER — Ambulatory Visit (INDEPENDENT_AMBULATORY_CARE_PROVIDER_SITE_OTHER): Payer: Medicare Other

## 2021-06-09 DIAGNOSIS — D696 Thrombocytopenia, unspecified: Secondary | ICD-10-CM | POA: Diagnosis not present

## 2021-06-09 DIAGNOSIS — E538 Deficiency of other specified B group vitamins: Secondary | ICD-10-CM

## 2021-06-09 DIAGNOSIS — Z7901 Long term (current) use of anticoagulants: Secondary | ICD-10-CM | POA: Diagnosis not present

## 2021-06-09 DIAGNOSIS — E785 Hyperlipidemia, unspecified: Secondary | ICD-10-CM

## 2021-06-09 DIAGNOSIS — I10 Essential (primary) hypertension: Secondary | ICD-10-CM | POA: Diagnosis not present

## 2021-06-09 DIAGNOSIS — E559 Vitamin D deficiency, unspecified: Secondary | ICD-10-CM | POA: Diagnosis not present

## 2021-06-09 LAB — CBC WITH DIFFERENTIAL/PLATELET
Basophils Absolute: 0.1 10*3/uL (ref 0.0–0.1)
Basophils Relative: 0.9 % (ref 0.0–3.0)
Eosinophils Absolute: 0.2 10*3/uL (ref 0.0–0.7)
Eosinophils Relative: 2.9 % (ref 0.0–5.0)
HCT: 40.8 % (ref 36.0–46.0)
Hemoglobin: 13.8 g/dL (ref 12.0–15.0)
Lymphocytes Relative: 16.3 % (ref 12.0–46.0)
Lymphs Abs: 0.9 10*3/uL (ref 0.7–4.0)
MCHC: 33.7 g/dL (ref 30.0–36.0)
MCV: 100.8 fl — ABNORMAL HIGH (ref 78.0–100.0)
Monocytes Absolute: 0.6 10*3/uL (ref 0.1–1.0)
Monocytes Relative: 10.9 % (ref 3.0–12.0)
Neutro Abs: 4 10*3/uL (ref 1.4–7.7)
Neutrophils Relative %: 69 % (ref 43.0–77.0)
Platelets: 111 10*3/uL — ABNORMAL LOW (ref 150.0–400.0)
RBC: 4.05 Mil/uL (ref 3.87–5.11)
RDW: 13.4 % (ref 11.5–15.5)
WBC: 5.8 10*3/uL (ref 4.0–10.5)

## 2021-06-09 LAB — COMPREHENSIVE METABOLIC PANEL
ALT: 41 U/L — ABNORMAL HIGH (ref 0–35)
AST: 20 U/L (ref 0–37)
Albumin: 3.7 g/dL (ref 3.5–5.2)
Alkaline Phosphatase: 114 U/L (ref 39–117)
BUN: 17 mg/dL (ref 6–23)
CO2: 33 mEq/L — ABNORMAL HIGH (ref 19–32)
Calcium: 9.8 mg/dL (ref 8.4–10.5)
Chloride: 99 mEq/L (ref 96–112)
Creatinine, Ser: 0.88 mg/dL (ref 0.40–1.20)
GFR: 64.05 mL/min (ref >60.00)
Glucose, Bld: 81 mg/dL (ref 70–99)
Potassium: 4 mEq/L (ref 3.5–5.1)
Sodium: 139 mEq/L (ref 135–145)
Total Bilirubin: 0.7 mg/dL (ref 0.2–1.2)
Total Protein: 6.9 g/dL (ref 6.0–8.3)

## 2021-06-09 LAB — MICROALBUMIN / CREATININE URINE RATIO
Creatinine,U: 33.7 mg/dL
Microalb Creat Ratio: 15.7 mg/g (ref 0.0–30.0)
Microalb, Ur: 5.3 mg/dL — ABNORMAL HIGH (ref 0.0–1.9)

## 2021-06-09 LAB — LIPID PANEL
Cholesterol: 171 mg/dL (ref 0–200)
HDL: 59 mg/dL (ref >39.00)
LDL Cholesterol: 88 mg/dL (ref 0–99)
NonHDL: 111.54
Total CHOL/HDL Ratio: 3
Triglycerides: 119 mg/dL (ref 0.0–149.0)
VLDL: 23.8 mg/dL (ref 0.0–40.0)

## 2021-06-09 LAB — VITAMIN D 25 HYDROXY (VIT D DEFICIENCY, FRACTURES): VITD: 31.27 ng/mL (ref 30.00–100.00)

## 2021-06-09 LAB — VITAMIN B12: Vitamin B-12: 614 pg/mL (ref 211–911)

## 2021-06-09 LAB — POCT INR: INR: 2.1 (ref 2.0–3.0)

## 2021-06-09 NOTE — Patient Instructions (Signed)
Pre visit review using our clinic review tool, if applicable. No additional management support is needed unless otherwise documented below in the visit note. 

## 2021-06-16 ENCOUNTER — Ambulatory Visit (INDEPENDENT_AMBULATORY_CARE_PROVIDER_SITE_OTHER): Payer: Medicare Other | Admitting: Family Medicine

## 2021-06-16 ENCOUNTER — Other Ambulatory Visit: Payer: Self-pay

## 2021-06-16 ENCOUNTER — Encounter: Payer: Self-pay | Admitting: Family Medicine

## 2021-06-16 VITALS — BP 122/78 | HR 104 | Temp 97.7°F | Ht 63.0 in | Wt 138.5 lb

## 2021-06-16 DIAGNOSIS — M85859 Other specified disorders of bone density and structure, unspecified thigh: Secondary | ICD-10-CM

## 2021-06-16 DIAGNOSIS — Z6824 Body mass index (BMI) 24.0-24.9, adult: Secondary | ICD-10-CM

## 2021-06-16 DIAGNOSIS — Z85048 Personal history of other malignant neoplasm of rectum, rectosigmoid junction, and anus: Secondary | ICD-10-CM

## 2021-06-16 DIAGNOSIS — I6523 Occlusion and stenosis of bilateral carotid arteries: Secondary | ICD-10-CM

## 2021-06-16 DIAGNOSIS — I7121 Aneurysm of the ascending aorta, without rupture: Secondary | ICD-10-CM

## 2021-06-16 DIAGNOSIS — Z5181 Encounter for therapeutic drug level monitoring: Secondary | ICD-10-CM

## 2021-06-16 DIAGNOSIS — N3946 Mixed incontinence: Secondary | ICD-10-CM

## 2021-06-16 DIAGNOSIS — M4810 Ankylosing hyperostosis [Forestier], site unspecified: Secondary | ICD-10-CM

## 2021-06-16 DIAGNOSIS — Z7901 Long term (current) use of anticoagulants: Secondary | ICD-10-CM

## 2021-06-16 DIAGNOSIS — I4819 Other persistent atrial fibrillation: Secondary | ICD-10-CM

## 2021-06-16 DIAGNOSIS — Z8781 Personal history of (healed) traumatic fracture: Secondary | ICD-10-CM

## 2021-06-16 DIAGNOSIS — Z0000 Encounter for general adult medical examination without abnormal findings: Secondary | ICD-10-CM | POA: Diagnosis not present

## 2021-06-16 DIAGNOSIS — I5032 Chronic diastolic (congestive) heart failure: Secondary | ICD-10-CM

## 2021-06-16 DIAGNOSIS — E559 Vitamin D deficiency, unspecified: Secondary | ICD-10-CM

## 2021-06-16 DIAGNOSIS — E538 Deficiency of other specified B group vitamins: Secondary | ICD-10-CM

## 2021-06-16 DIAGNOSIS — I2721 Secondary pulmonary arterial hypertension: Secondary | ICD-10-CM

## 2021-06-16 DIAGNOSIS — D181 Lymphangioma, any site: Secondary | ICD-10-CM

## 2021-06-16 DIAGNOSIS — Z7189 Other specified counseling: Secondary | ICD-10-CM

## 2021-06-16 DIAGNOSIS — K219 Gastro-esophageal reflux disease without esophagitis: Secondary | ICD-10-CM

## 2021-06-16 DIAGNOSIS — Z2239 Carrier of other specified bacterial diseases: Secondary | ICD-10-CM

## 2021-06-16 DIAGNOSIS — Z950 Presence of cardiac pacemaker: Secondary | ICD-10-CM

## 2021-06-16 DIAGNOSIS — D696 Thrombocytopenia, unspecified: Secondary | ICD-10-CM

## 2021-06-16 DIAGNOSIS — Z923 Personal history of irradiation: Secondary | ICD-10-CM

## 2021-06-16 DIAGNOSIS — N182 Chronic kidney disease, stage 2 (mild): Secondary | ICD-10-CM

## 2021-06-16 DIAGNOSIS — F5104 Psychophysiologic insomnia: Secondary | ICD-10-CM

## 2021-06-16 DIAGNOSIS — I712 Thoracic aortic aneurysm, without rupture: Secondary | ICD-10-CM

## 2021-06-16 DIAGNOSIS — E785 Hyperlipidemia, unspecified: Secondary | ICD-10-CM

## 2021-06-16 DIAGNOSIS — J449 Chronic obstructive pulmonary disease, unspecified: Secondary | ICD-10-CM

## 2021-06-16 DIAGNOSIS — R319 Hematuria, unspecified: Secondary | ICD-10-CM

## 2021-06-16 DIAGNOSIS — Z952 Presence of prosthetic heart valve: Secondary | ICD-10-CM

## 2021-06-16 DIAGNOSIS — I10 Essential (primary) hypertension: Secondary | ICD-10-CM

## 2021-06-16 DIAGNOSIS — R413 Other amnesia: Secondary | ICD-10-CM

## 2021-06-16 LAB — POC URINALSYSI DIPSTICK (AUTOMATED)
Bilirubin, UA: NEGATIVE
Blood, UA: NEGATIVE
Glucose, UA: NEGATIVE
Ketones, UA: NEGATIVE
Nitrite, UA: NEGATIVE
Protein, UA: POSITIVE — AB
Spec Grav, UA: 1.015 (ref 1.010–1.025)
Urobilinogen, UA: 0.2 E.U./dL
pH, UA: 6 (ref 5.0–8.0)

## 2021-06-16 MED ORDER — FUROSEMIDE 40 MG PO TABS: ORAL_TABLET | ORAL | 1 refills | Status: DC

## 2021-06-16 MED ORDER — TRAZODONE HCL 50 MG PO TABS: 25.0000 mg | ORAL_TABLET | Freq: Every day | ORAL | 3 refills | Status: DC

## 2021-06-16 MED ORDER — TRAMADOL HCL 50 MG PO TABS: 50.0000 mg | ORAL_TABLET | Freq: Two times a day (BID) | ORAL | 0 refills | Status: DC | PRN

## 2021-06-16 NOTE — Assessment & Plan Note (Signed)
Advanced directives: scanned and in chart 10/2014. Has living will at home. HCPOA is husband (deceased) then son. Has updated at home, asked to bring [place] copy.

## 2021-06-16 NOTE — Assessment & Plan Note (Addendum)
Desires to stop temazepam due to possible effect on memory - will retrial trazodone.

## 2021-06-16 NOTE — Assessment & Plan Note (Signed)
Stable period on lovastatin. The 10-year ASCVD risk score Mikey Bussing DC Brooke Bonito., et al., 2013) is: 19.1%   Values used to calculate the score:     Age: 75 years     Sex: Female     Is Non-Hispanic African American: No     Diabetic: No     Tobacco smoker: No     Systolic Blood Pressure: 123XX123 mmHg     Is BP treated: Yes     HDL Cholesterol: 59 mg/dL     Total Cholesterol: 171 mg/dL

## 2021-06-16 NOTE — Patient Instructions (Signed)
Urinalysis today to rule out blood in urine.  Go ahead and taper off temazepam - take every other day for a few weeks then stop. In its place start trazodone 1/2 tablet nightly for a few nights then may increase to full tablet nightly.  Bring [place] a copy of your living will - check with attorney on updating this.  You are doing well today Return as needed or in 6 months for follow up visit with labs.   Health Maintenance After Age 76 After age 22, you are at a higher risk for certain long-term diseases and infections as well as injuries from falls. Falls are a major cause of broken bones and head injuries in people who are older than age 82. Getting regular preventive care can help to keep you healthy and well. Preventive care includes getting regular testing and making lifestyle changes as recommended by your health care provider. Talk with your health care provider about: Which screenings and tests you should have. A screening is a test that checks for a disease when you have no symptoms. A diet and exercise plan that is right for you. What should I know about screenings and tests to prevent falls? Screening and testing are the best ways to find a health problem early. Early diagnosis and treatment give you the best chance of managing medical conditions that are common after age 70. Certain conditions and lifestyle choices may make you more likely to have a fall. Your health care provider may recommend: Regular vision checks. Poor vision and conditions such as cataracts can make you more likely to have a fall. If you wear glasses, make sure to get your prescription updated if your vision changes. Medicine review. Work with your health care provider to regularly review all of the medicines you are taking, including over-the-counter medicines. Ask your health care provider about any side effects that may make you more likely to have a fall. Tell your health care provider if any medicines that you take  make you feel dizzy or sleepy. Osteoporosis screening. Osteoporosis is a condition that causes the bones to get weaker. This can make the bones weak and cause them to break more easily. Blood pressure screening. Blood pressure changes and medicines to control blood pressure can make you feel dizzy. Strength and balance checks. Your health care provider may recommend certain tests to check your strength and balance while standing, walking, or changing positions. Foot health exam. Foot pain and numbness, as well as not wearing proper footwear, can make you more likely to have a fall. Depression screening. You may be more likely to have a fall if you have a fear of falling, feel emotionally low, or feel unable to do activities that you used to do. Alcohol use screening. Using too much alcohol can affect your balance and may make you more likely to have a fall. What actions can I take to lower my risk of falls? General instructions Talk with your health care provider about your risks for falling. Tell your health care provider if: You fall. Be sure to tell your health care provider about all falls, even ones that seem minor. You feel dizzy, sleepy, or off-balance. Take over-the-counter and prescription medicines only as told by your health care provider. These include any supplements. Eat a healthy diet and maintain a healthy weight. A healthy diet includes low-fat dairy products, low-fat (lean) meats, and fiber from whole grains, beans, and lots of fruits and vegetables. Home safety Remove any tripping  hazards, such as rugs, cords, and clutter. Install safety equipment such as grab bars in bathrooms and safety rails on stairs. Keep rooms and walkways well-lit. Activity  Follow a regular exercise program to stay fit. This will help you maintain your balance. Ask your health care provider what types of exercise are appropriate for you. If you need a cane or walker, use it as recommended by your  health care provider. Wear supportive shoes that have nonskid soles. Lifestyle Do not drink alcohol if your health care provider tells you not to drink. If you drink alcohol, limit how much you have: 0-1 drink a day for women. 0-2 drinks a day for men. Be aware of how much alcohol is in your drink. In the [place] one drink equals one typical bottle of beer (12 oz), one-half glass of wine (5 oz), or one shot of hard liquor (1 oz). Do not use any products that contain nicotine or tobacco, such as cigarettes and e-cigarettes. If you need help quitting, ask your health care provider. Summary Having a healthy lifestyle and getting preventive care can help to protect your health and wellness after age 38. Screening and testing are the best way to find a health problem early and help you avoid having a fall. Early diagnosis and treatment give you the best chance for managing medical conditions that are more common for people who are older than age 72. Falls are a major cause of broken bones and head injuries in people who are older than age 25. Take precautions to prevent a fall at home. Work with your health care provider to learn what changes you can make to improve your health and wellness and to prevent falls. This information is not intended to replace advice given to you by your health care provider. Make sure you discuss any questions you have with your health care provider. Document Revised: 12/04/2020 Document Reviewed: 09/11/2020 Elsevier Patient Education  2022 [employer].

## 2021-06-16 NOTE — Assessment & Plan Note (Signed)

## 2021-06-16 NOTE — Progress Notes (Signed)
Patient ID: Prudence Jermaine Hafen, female    DOB: September 29, 1946, 75 y.o.   MRN: [id number]  This visit was conducted in person.  BP 122/78   Pulse (!) 104   Temp 97.7 F (36.5 C) (Temporal)   Ht '5\' 3"'$  (1.6 m)   Wt 138 lb 8 oz (62.8 kg)   SpO2 96%   BMI 24.53 kg/m    CC: AMW Subjective:   HPI: Gredmarie Alissa Knecht is a 75 y.o. female presenting on 06/16/2021 for Medicare Wellness (Wants to discuss tramadol and temazepam.)   Did not see health advisor this year.   Hearing Screening   '500Hz'$  '1000Hz'$  '2000Hz'$  '4000Hz'$   Right ear 40 0 0 0  Left ear 25 40 0 0  Comments: Pt is aware of decreased hearing.  Vision Screening - Comments:: Last eye exam, Apr/May 01/21/2021.  [place] Office Visit from 06/16/2021 in [place] at [facility] Total Score 0     Deferring hearing aides for now - waiting for them to be OTC.  Fall Risk  06/16/2021 06/10/2020 05/02/2019 11/15/2017 11/14/2016  Falls in the past year? 1 0 0 No No  Number falls in past yr: 1 - - - -  Injury with Fall? 1 - - - -  Comment Abrasions - - - -  Risk for fall due to : - - - - -  Risk for fall due to: Comment - - - - -  Referred to PT at [facility] after fall - continues seeing PT with some beneffit.   New INR range is 2.5-3 - h/o L eye vision loss when coumadin levels were too low.    Saw neurology [facility]) for memory loss, gait difficulty and L eye amaurosis fugax - planned neck CTA later this month. Notes ongoing orthostatic dizziness episodes/presyncope.  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   Episode of hematuria after colonoscopy which has since cleared.   Ongoing sleep maintenance insomnia - despite temazepam and gabapentin '300mg'$  nightly. Melatonin ineffective. Desires to avoid medication that could affect memory - interested stopping temazepam. Previous trazodone trial ineffective, but would be willing to retry.   Preventative: Colonoscopy 05/2021 - diverticulosis and patent colon anastomosis  with some edema/narrowing, rpt 5 yrs Ardis Hughs) Well woman - none recently. Had normal paps in past and would like to age out of cervical cancer screening. Discussed other GYN cancers to watch for.  [facility]  DEXA - T -1.7 hip (06/2018). Doesn't drink milk. Eats yogurt regularly. She takes calcium and vitamin D regularly.  Lung cancer screening - not eligible  Flu shot yearly  COVID vaccine - Pfizer 10/2019, 11/2019, Moderna booster 08/2020, 02/2021.  Pneumovax 2009, 06/2013, prevnar-13 05/2014  Td 01/21/06  zostavax - 09/2014  shingrix - 06/2018, 08/2018  Advanced directives: scanned and in chart 10/2014. Has living will at home. HCPOA is husband (deceased) then son. Has updated at home, asked to bring [place] copy.  Seat belt use discussed Sunscreen use discussed. No changing moles on skin.  Ex smoker quit 01/22/1975 Alcohol - 1 glass wine a few nights a month Dentist - q6 mo - upcoming dental work  Eye exam yearly monitoring cataracts Bowel - no constipation  Bladder - ongoing incontinence followed by Dr Myna Hidalgo. Husband died 04/24/15 MVA. 1 cat.  2 sons; 1 grandchild. One son died of MI 80 Married 1965/01/21  Retired-[employer], volunteers at [employer], to start at [facility] [employer] in  [place] Activity: pulm rehab  Diet: some water, fruits/vegetables daily, yogurt daily     Relevant past medical, surgical, family and social history reviewed and updated as indicated. Interim medical history since our last visit reviewed. Allergies and medications reviewed and updated. Outpatient Medications Prior to Visit  Medication Sig Dispense Refill   acetaminophen (TYLENOL) 500 MG tablet Take 2 tablets (1,000 mg total) by mouth daily.     albuterol (VENTOLIN HFA) 108 (90 Base) MCG/ACT inhaler Inhale 2 puffs into the lungs every 6 (six) hours as needed for wheezing. 18 g 6   amoxicillin (AMOXIL) 500 MG capsule TAKE 4 CAPSULES BY MOUTH 30-60 MINUTES PRIOR TO APPOINTMENT 4 capsule 5   Biotin  1000 MCG tablet Take 1,000 mcg by mouth daily.     calcium carbonate (OSCAL) 1500 (600 Ca) MG TABS tablet Take 600 mg of elemental calcium by mouth daily.     carboxymethylcellulose (REFRESH PLUS) 0.5 % SOLN Place 1 drop into both eyes 3 (three) times daily as needed (dry eyes).     cephALEXin (KEFLEX) 250 MG capsule Take 250 mg by mouth daily.     Cholecalciferol (VITAMIN D) 50 MCG (2000 UT) tablet Take 2,000 Units by mouth daily.     docusate sodium (COLACE) 100 MG capsule Take 200 mg by mouth every evening.     enoxaparin (LOVENOX) 60 MG/0.6ML injection Inject 0.8ms ('60mg'$ )  into the skin every 12 hours for 18 doses as directed by coumadin clinic. 10.8 mL 0   gabapentin (NEURONTIN) 300 MG capsule Take 300 mg by mouth at bedtime.     GEMTESA 75 MG TABS Take 75 mg by mouth daily.     ipratropium-albuterol (DUONEB) 0.5-2.5 (3) MG/3ML SOLN Take 3 mLs by nebulization every 6 (six) hours as needed. 360 mL 0   lovastatin (MEVACOR) 40 MG tablet TAKE 1 TABLET(40 MG) BY MOUTH AT BEDTIME 90 tablet 3   montelukast (SINGULAIR) 10 MG tablet TAKE 1 TABLET(10 MG) BY MOUTH AT BEDTIME 30 tablet 11   Multiple Vitamins-Minerals (PRESERVISION AREDS 2 PO) Take 2 capsules by mouth every evening.      polyethylene glycol (MIRALAX / GLYCOLAX) packet Take 17 g by mouth daily. 14 each 0   potassium chloride (KLOR-CON) 10 MEQ tablet TAKE 2 TABLETS(20 MEQ) BY MOUTH DAILY 60 tablet 9   PSYLLIUM PO Take 1 capsule by mouth daily.     silver sulfADIAZINE (SILVADENE) 1 % cream Apply 1 application topically daily. 50 g 1   TRELEGY ELLIPTA 200-62.5-25 MCG/INH AEPB INHALE 1 PUFF INTO THE LUNGS DAILY 60 each 5   vitamin B-12 (CYANOCOBALAMIN) 500 MCG tablet Take 500 mcg by mouth every other day.      warfarin (COUMADIN) 5 MG tablet TAKE 1 TO 2 TABLETS BY MOUTH DAILY AS DIRECTED BY COUMADIN CLINIC 60 tablet 5   Zinc 50 MG CAPS Take 1 capsule (50 mg total) by mouth daily.  0   furosemide (LASIX) 40 MG tablet TAKE 1 TABLET(40 MG) BY  MOUTH DAILY 30 tablet 11   temazepam (RESTORIL) 7.5 MG capsule TAKE 1 CAPSULE(7.5 MG) BY MOUTH AT BEDTIME AS NEEDED FOR SLEEP 30 capsule 0   traMADol (ULTRAM) 50 MG tablet TAKE 1 TABLET(50 MG) BY MOUTH TWICE DAILY AS NEEDED FOR MODERATE PAIN 30 tablet 0   Facility-Administered Medications Prior to Visit  Medication Dose Route Frequency Provider Last Rate Last Admin   ondansetron (ZOFRAN) 4 mg in sodium chloride 0.9 % 50 mL IVPB  4 mg Intravenous  Q6H PRN Kristeen Miss, MD         Per HPI unless specifically indicated in ROS section below Review of Systems  Constitutional:  Negative for activity change, appetite change, chills, fatigue, fever and unexpected weight change.  HENT:  Negative for hearing loss.   Eyes:  Negative for visual disturbance.  Respiratory:  Negative for cough, chest tightness, shortness of breath and wheezing.   Cardiovascular:  Positive for leg swelling. Negative for chest pain and palpitations.  Gastrointestinal:  Negative for abdominal distention, abdominal pain, blood in stool, constipation, diarrhea, nausea and vomiting.  Genitourinary:  Negative for difficulty urinating and hematuria.  Musculoskeletal:  Negative for arthralgias, myalgias and neck pain.  Skin:  Negative for rash.  Neurological:  Positive for dizziness. Negative for seizures, syncope and headaches.  Hematological:  Negative for adenopathy. Does not bruise/bleed easily.  Psychiatric/Behavioral:  Negative for dysphoric mood. The patient is not nervous/anxious.    Objective:  BP 122/78   Pulse (!) 104   Temp 97.7 F (36.5 C) (Temporal)   Ht '5\' 3"'$  (1.6 m)   Wt 138 lb 8 oz (62.8 kg)   SpO2 96%   BMI 24.53 kg/m   Wt Readings from Last 3 Encounters:  06/16/21 138 lb 8 oz (62.8 kg)  05/28/21 137 lb 12.8 oz (62.5 kg)  05/24/21 139 lb (63 kg)      Physical Exam Vitals and nursing note reviewed.  Constitutional:      Appearance: Normal appearance. She is not ill-appearing.  HENT:     Head:  Normocephalic and atraumatic.     Right Ear: Tympanic membrane, ear canal and external ear normal. There is no impacted cerumen.     Left Ear: Tympanic membrane, ear canal and external ear normal. There is no impacted cerumen.  Eyes:     General:        Right eye: No discharge.        Left eye: No discharge.     Extraocular Movements: Extraocular movements intact.     Conjunctiva/sclera: Conjunctivae normal.     Pupils: Pupils are equal, round, and reactive to light.  Neck:     Thyroid: No thyroid mass or thyromegaly.     Vascular: No carotid bruit.  Cardiovascular:     Rate and Rhythm: Normal rate and regular rhythm.     Pulses: Normal pulses.     Heart sounds: Normal heart sounds. No murmur heard. Pulmonary:     Effort: Pulmonary effort is normal. No respiratory distress.     Breath sounds: Normal breath sounds. No wheezing, rhonchi or rales.  Abdominal:     General: Bowel sounds are normal. There is no distension.     Palpations: Abdomen is soft. There is no mass.     Tenderness: There is no abdominal tenderness. There is no guarding or rebound.     Hernia: No hernia is present.  Musculoskeletal:     Cervical back: Normal range of motion and neck supple. No rigidity.     Right lower leg: Edema (tr) present.     Left lower leg: Edema (tr) present.  Lymphadenopathy:     Cervical: No cervical adenopathy.  Skin:    General: Skin is warm and dry.     Findings: No rash.  Neurological:     General: No focal deficit present.     Mental Status: She is alert. Mental status is at baseline.     Comments:  Recall 3/3 Calculation 5/5 DLROW  Psychiatric:        Mood and Affect: Mood normal.        Behavior: Behavior normal.      Results for orders placed or performed in visit on 06/16/21  POCT Urinalysis Dipstick (Automated)  Result Value Ref Range   Color, UA yellow    Clarity, UA clear    Glucose, UA Negative Negative   Bilirubin, UA negative    Ketones, UA negative    Spec  Grav, UA 1.015 1.010 - 1.025   Blood, UA negative    pH, UA 6.0 5.0 - 8.0   Protein, UA Positive (A) Negative   Urobilinogen, UA 0.2 0.2 or 1.0 E.U./dL   Nitrite, UA negative    Leukocytes, UA Moderate (2+) (A) Negative   *Note: Due to a large number of results and/or encounters for the requested time period, some results have not been displayed. A complete set of results can be found in Results Review.   Lab Results  Component Value Date   CHOL 171 06/09/2021   HDL 59.00 06/09/2021   LDLCALC 88 06/09/2021   LDLDIRECT 111.0 06/26/2015   TRIG 119.0 06/09/2021   CHOLHDL 3 06/09/2021    Assessment & Plan:  This visit occurred during the SARS-CoV-2 public health emergency.  Safety protocols were in place, including screening questions prior to the visit, additional usage of staff PPE, and extensive cleaning of exam room while observing appropriate contact time as indicated for disinfecting solutions.   Problem List Items Addressed This Visit     Medicare annual wellness visit, subsequent - Primary (Chronic)    I have personally reviewed the Medicare Annual Wellness questionnaire and have noted 1. The patient's medical and social history 2. Their use of alcohol, tobacco or illicit drugs 3. Their current medications and supplements 4. The patient's functional ability including ADL's, fall risks, home safety risks and hearing or visual impairment. Cognitive function has been assessed and addressed as indicated.  5. Diet and physical activity 6. Evidence for depression or mood disorders The patients weight, height, BMI have been recorded in the chart. I have made referrals, counseling and provided education to the patient based on review of the above and I have provided the pt with a written personalized care plan for preventive services. Provider list updated.. See scanned questionairre as needed for further documentation. Reviewed preventative protocols and updated unless pt declined.        Health maintenance examination (Chronic)    Preventative protocols reviewed and updated unless pt declined. Discussed healthy diet and lifestyle.       Advanced care planning/counseling discussion (Chronic)    Advanced directives: scanned and in chart 10/2014. Has living will at home. HCPOA is husband (deceased) then son. Has updated at home, asked to bring [place] copy.       HLD (hyperlipidemia)    Stable period on lovastatin. The 10-year ASCVD risk score Mikey Bussing DC Brooke Bonito., et al., 2013) is: 19.1%   Values used to calculate the score:     Age: 21 years     Sex: Female     Is Non-Hispanic African American: No     Diabetic: No     Tobacco smoker: No     Systolic Blood Pressure: 123XX123 mmHg     Is BP treated: Yes     HDL Cholesterol: 59 mg/dL     Total Cholesterol: 171 mg/dL       Body mass index (BMI) 24.0-24.9, adult    Maintaining  weight after 75+ lb weight loss.       Essential hypertension, benign    Chronic, stable only on lasix '40mg'$ . BP improved with weight loss.       COPD (chronic obstructive pulmonary disease) (HCC)    Stable period only on trelegy.       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       Osteopenia    Update DEXA due 2024       Chronic insomnia    Desires to stop temazepam due to possible effect on memory - will retrial trazodone.       Vitamin B12 deficiency    Continue oral b12 replacement 598mg QOD      Mixed stress and urge urinary incontinence    Followed by urology, now on [place] but unsure if helping, considering trial off medication.       CKD (chronic kidney disease) stage 2, GFR 60-89 ml/min    Latest Cr improved - continue to monitor.        Vitamin D deficiency    Continue vit D replacement.       DISH (diffuse idiopathic skeletal hyperostosis)   Thrombocytopenia (HCC)    Chronic, continue to monitor yearly.       Memory loss    Overall reassuring eval today.  Will stop benzo      History of vertebral fracture    In h/o compression fractures, consider updated DEXA and osteoporosis treatment.       Anticoagulation management encounter   Carotid stenosis    Reviewed recent carotid [place] - stable mild stenosis      Persistent atrial fibrillation (HCC)    Continues coumadin.       Lymphangioma    Incidentally noted on carotid [place] neck CT showed likely benign lymphangioma. Pt remains asxs.       Pacemaker   Pseudomonas aeruginosa colonization    Appreciate pulm care.  Consider pseudomonas coverage for future COPD exacerbations      Ascending aortic aneurysm ([place]    4.1cm on CT 01/2021 rec yearly monitoring      Hematuria    Isolated episode of gross hematuria after colonoscopy - update UA today - normal      Relevant Orders   POCT Urinalysis Dipstick (Automated) (Completed)   Pulmonary artery hypertension ([place]    Noted on recent CT 01/2021        Meds ordered this encounter  Medications   traMADol (ULTRAM) 50 MG tablet    Sig: Take 1 tablet (50 mg total) by mouth 2 (two) times daily as needed for moderate pain.    Dispense:  30 tablet    Refill:  0   traZODone (DESYREL) 50 MG tablet    Sig: Take 0.5-1 tablets (25-50 mg total) by mouth at bedtime.    Dispense:  30 tablet    Refill:  3   Orders Placed This Encounter  Procedures   POCT Urinalysis Dipstick (Automated)    Patient instructions: Urinalysis today to rule out blood in urine.  Go ahead and taper off temazepam - take every other day for a few  weeks then stop. In its place start trazodone 1/2 tablet nightly for a few nights then may increase to full tablet nightly.  Bring [place] a copy of your living will - check with attorney on  updating this.  You are doing well today Return as needed or in 6 months for follow up visit with labs.   Follow up plan: Return in about 6 months (around 12/14/2021), or if symptoms worsen or fail to improve, for follow up visit.  Ria Bush, MD

## 2021-06-16 NOTE — Assessment & Plan Note (Signed)
Preventative protocols reviewed and updated unless pt declined. Discussed healthy diet and lifestyle.  

## 2021-06-17 ENCOUNTER — Telehealth: Payer: Self-pay

## 2021-06-17 DIAGNOSIS — I2721 Secondary pulmonary arterial hypertension: Secondary | ICD-10-CM | POA: Insufficient documentation

## 2021-06-17 DIAGNOSIS — R319 Hematuria, unspecified: Secondary | ICD-10-CM

## 2021-06-17 HISTORY — DX: Secondary pulmonary arterial hypertension: I27.21

## 2021-06-17 HISTORY — DX: Hematuria, unspecified: R31.9

## 2021-06-17 NOTE — Assessment & Plan Note (Addendum)
Appreciate pulm care.  Consider pseudomonas coverage for future COPD exacerbations

## 2021-06-17 NOTE — Assessment & Plan Note (Signed)
4.1cm on CT 01/2021 rec yearly monitoring

## 2021-06-17 NOTE — Assessment & Plan Note (Addendum)
Isolated episode of gross hematuria after colonoscopy - update UA today - normal

## 2021-06-17 NOTE — Assessment & Plan Note (Signed)
Stable period only on trelegy.

## 2021-06-17 NOTE — Assessment & Plan Note (Signed)
Incidentally found, remote fracture on imaging 2020

## 2021-06-17 NOTE — Assessment & Plan Note (Signed)
Update DEXA due 2024

## 2021-06-17 NOTE — Assessment & Plan Note (Signed)
This improved with weight loss - now off PPI

## 2021-06-17 NOTE — Assessment & Plan Note (Signed)
Recent colonoscopy reassuring (05/2021)

## 2021-06-17 NOTE — Assessment & Plan Note (Signed)
Continue lasix 40 mg daily.  

## 2021-06-17 NOTE — Assessment & Plan Note (Signed)
Continues coumadin. 

## 2021-06-17 NOTE — Assessment & Plan Note (Signed)
Followed by urology, now on [place] but unsure if helping, considering trial off medication.

## 2021-06-17 NOTE — Assessment & Plan Note (Signed)
Noted on recent CT 01/2021

## 2021-06-17 NOTE — Assessment & Plan Note (Signed)
Maintaining weight after 75+ lb weight loss.

## 2021-06-17 NOTE — Assessment & Plan Note (Signed)
Continue oral b12 replacement 511mg QOD

## 2021-06-17 NOTE — Assessment & Plan Note (Signed)
Reviewed recent carotid US 12/2020 - stable mild stenosis

## 2021-06-17 NOTE — Assessment & Plan Note (Signed)
Chronic, continue to monitor yearly.

## 2021-06-17 NOTE — Telephone Encounter (Signed)
Pt LVM that she needs to change her coumadin clinic apt on 9/16.  LVM for pt to return call.

## 2021-06-17 NOTE — Assessment & Plan Note (Signed)
In h/o compression fractures, consider updated DEXA and osteoporosis treatment.

## 2021-06-17 NOTE — Assessment & Plan Note (Signed)
Latest Cr improved - continue to monitor.

## 2021-06-17 NOTE — Assessment & Plan Note (Addendum)
Chronic, stable only on lasix '40mg'$ . BP improved with weight loss.

## 2021-06-17 NOTE — Assessment & Plan Note (Signed)
Overall reassuring eval today.  Will stop benzo

## 2021-06-17 NOTE — Assessment & Plan Note (Signed)
Incidentally noted on carotid US, neck CT showed likely benign lymphangioma. Pt remains asxs.

## 2021-06-17 NOTE — Assessment & Plan Note (Signed)
Continue vit D replacement.  

## 2021-06-18 ENCOUNTER — Ambulatory Visit
Admission: RE | Admit: 2021-06-18 | Discharge: 2021-06-18 | Disposition: A | Discharge status: Home / Self Care | Payer: Medicare Other | Source: Ambulatory Visit | Attending: Diagnostic Neuroimaging | Admitting: Diagnostic Neuroimaging

## 2021-06-18 ENCOUNTER — Other Ambulatory Visit: Payer: Self-pay

## 2021-06-18 ENCOUNTER — Other Ambulatory Visit: Payer: Medicare Other

## 2021-06-18 ENCOUNTER — Other Ambulatory Visit: Payer: Self-pay | Admitting: Diagnostic Neuroimaging

## 2021-06-18 DIAGNOSIS — G453 Amaurosis fugax: Secondary | ICD-10-CM

## 2021-06-18 MED ORDER — IOPAMIDOL (ISOVUE-370) INJECTION 76%
75.0000 mL | Freq: Once | INTRAVENOUS | Status: AC | PRN
  Administered 2021-06-18: 75 mL via INTRAVENOUS

## 2021-06-18 MED ORDER — FUROSEMIDE 40 MG PO TABS: 40.0000 mg | ORAL_TABLET | Freq: Every day | ORAL | 11 refills | Status: DC

## 2021-06-18 NOTE — Addendum Note (Signed)
Addended by: Ricci Barker on: 06/18/2021 09:19 AM   Modules accepted: Orders

## 2021-06-21 ENCOUNTER — Encounter: Payer: Self-pay | Admitting: Family Medicine

## 2021-06-21 MED ORDER — CEPHALEXIN 500 MG PO CAPS: 500.0000 mg | ORAL_CAPSULE | Freq: Three times a day (TID) | ORAL | 0 refills | Status: DC

## 2021-06-24 ENCOUNTER — Telehealth: Payer: Self-pay | Admitting: Diagnostic Neuroimaging

## 2021-06-24 NOTE — Telephone Encounter (Signed)
Please advise/result

## 2021-06-24 NOTE — Telephone Encounter (Signed)
Noted, I called GI they are going to read it now. They stated they don't know why it hasn't been read yet.

## 2021-06-24 NOTE — Progress Notes (Signed)
Reviewed

## 2021-06-24 NOTE — Telephone Encounter (Signed)
Pt called asking about the results of her scans that were taking on last Friday. Pt requesting a call back.

## 2021-06-25 ENCOUNTER — Ambulatory Visit (INDEPENDENT_AMBULATORY_CARE_PROVIDER_SITE_OTHER): Payer: Medicare Other

## 2021-06-25 ENCOUNTER — Other Ambulatory Visit: Payer: Self-pay

## 2021-06-25 DIAGNOSIS — Z7901 Long term (current) use of anticoagulants: Secondary | ICD-10-CM | POA: Diagnosis not present

## 2021-06-25 LAB — POCT INR: INR: 1.5 — AB (ref 2.0–3.0)

## 2021-06-25 NOTE — Telephone Encounter (Signed)
Can we schedule appt to see me in [place] today? I could see at 12:30 or 2:30 if she can drive out to [employer].

## 2021-06-25 NOTE — Patient Instructions (Addendum)
Pre visit review using our clinic review tool, if applicable. No additional management support is needed unless otherwise documented below in the visit note.  Take 1 1/2 tablets today and take 1 1/2 tablets tomorrow and then change weekly dose to take 1 tablet daily except take 1 1/1 tablets on Wednesdays. Recheck in 1 wks.

## 2021-06-25 NOTE — Telephone Encounter (Signed)
Pt was in this morning for coumadin clinic. She cannot make it to [place] this afternoon due to her car being taken into the shop. She wanted to wait until Monday. Made apt for GO with Romilda Garret, NP, and gave pt directions for clinic. Advised if anything worsened to contact office. Pt verbalized understanding.

## 2021-06-25 NOTE — Telephone Encounter (Signed)
Dr. Darnell Level had an opening come up on Mon so the apt has been changed from [facility] to Dr. Darnell Level for the same apt time.

## 2021-06-28 ENCOUNTER — Ambulatory Visit: Payer: Medicare Other | Admitting: Nurse Practitioner

## 2021-06-28 ENCOUNTER — Other Ambulatory Visit: Payer: Self-pay

## 2021-06-28 ENCOUNTER — Ambulatory Visit (INDEPENDENT_AMBULATORY_CARE_PROVIDER_SITE_OTHER): Payer: Medicare Other | Admitting: Family Medicine

## 2021-06-28 ENCOUNTER — Encounter: Payer: Self-pay | Admitting: Family Medicine

## 2021-06-28 VITALS — BP 112/68 | HR 109 | Temp 98.3°F | Ht 63.0 in | Wt 136.0 lb

## 2021-06-28 DIAGNOSIS — S81802A Unspecified open wound, left lower leg, initial encounter: Secondary | ICD-10-CM | POA: Diagnosis not present

## 2021-06-28 DIAGNOSIS — L97909 Non-pressure chronic ulcer of unspecified part of unspecified lower leg with unspecified severity: Secondary | ICD-10-CM | POA: Insufficient documentation

## 2021-06-28 DIAGNOSIS — S51812A Laceration without foreign body of left forearm, initial encounter: Secondary | ICD-10-CM | POA: Insufficient documentation

## 2021-06-28 NOTE — Patient Instructions (Addendum)
Leg wound is looking better - continue leg elevation, let us know if redness spreading again.  For arm wounds - continue tegaderm dressing changes every 1-2 days, topical antibiotic as well. Let us know if not improving with this.  Good to see you today!  Happy birthday!

## 2021-06-28 NOTE — Progress Notes (Signed)
Patient ID: [name], female    DOB: April 25, 1946, 75 y.o.   MRN: [id number]  This visit was conducted in person.  BP 112/68   Pulse (!) 109   Temp 98.3 F (36.8 C) (Temporal)   Ht 5\' 3"  (1.6 m)   Wt 136 lb (61.7 kg)   SpO2 97%   BMI 24.09 kg/m    CC: check sore on leg Subjective:   HPI: [name] is a 75 y.o. female presenting on 06/28/2021 for Sore on leg   DOI: ~06/10/2021 While at [facility], dropped wooden decoration onto L anterior lower leg.  She took 5d keflex TID course with benefit.   Another abrasion/skin tear sustained while in her sleep overnight on Friday - to L lateral forearm. Doesn't know what happened with this.   She is on coumadin, noted increasing fluctuations recently - has f/u coumadin clinic planned for later this week.  Lab Results  Component Value Date   INR 1.5 (A) 06/25/2021   INR 2.1 06/09/2021   INR 1.3 (A) 06/04/2021       Relevant past medical, surgical, family and social history reviewed and updated as indicated. Interim medical history since our last visit reviewed. Allergies and medications reviewed and updated. Outpatient Medications Prior to Visit  Medication Sig Dispense Refill   acetaminophen (TYLENOL) 500 MG tablet Take 2 tablets (1,000 mg total) by mouth daily.     albuterol (VENTOLIN HFA) 108 (90 Base) MCG/ACT inhaler Inhale 2 puffs into the lungs every 6 (six) hours as needed for wheezing. 18 g 6   amoxicillin (AMOXIL) 500 MG capsule TAKE 4 CAPSULES BY MOUTH 30-60 MINUTES PRIOR TO APPOINTMENT 4 capsule 5   Biotin 1000 MCG tablet Take 1,000 mcg by mouth daily.     calcium carbonate (OSCAL) 1500 (600 Ca) MG TABS tablet Take 600 mg of elemental calcium by mouth daily.     carboxymethylcellulose (REFRESH PLUS) 0.5 % SOLN Place 1 drop into both eyes 3 (three) times daily as needed (dry eyes).     cephALEXin (KEFLEX) 250 MG capsule Take 250 mg by mouth daily.     cephALEXin (KEFLEX) 500 MG capsule Take 1 capsule  (500 mg total) by mouth 3 (three) times daily. 15 capsule 0   Cholecalciferol (VITAMIN D) 50 MCG (2000 UT) tablet Take 2,000 Units by mouth daily.     docusate sodium (COLACE) 100 MG capsule Take 200 mg by mouth every evening.     enoxaparin (LOVENOX) 60 MG/0.6ML injection Inject 0.6mLs (60mg )  into the skin every 12 hours for 18 doses as directed by coumadin clinic. 10.8 mL 0   furosemide (LASIX) 40 MG tablet Take 1 tablet (40 mg total) by mouth daily. May take an extra tablet daily as needed for swelling. 45 tablet 11   gabapentin (NEURONTIN) 300 MG capsule Take 300 mg by mouth at bedtime.     GEMTESA 75 MG TABS Take 75 mg by mouth daily.     ipratropium-albuterol (DUONEB) 0.5-2.5 (3) MG/3ML SOLN Take 3 mLs by nebulization every 6 (six) hours as needed. 360 mL 0   lovastatin (MEVACOR) 40 MG tablet TAKE 1 TABLET(40 MG) BY MOUTH AT BEDTIME 90 tablet 3   montelukast (SINGULAIR) 10 MG tablet TAKE 1 TABLET(10 MG) BY MOUTH AT BEDTIME 30 tablet 11   Multiple Vitamins-Minerals (PRESERVISION AREDS 2 PO) Take 2 capsules by mouth every evening.      polyethylene glycol (MIRALAX / GLYCOLAX) packet Take 17 g  by mouth daily. 14 each 0   potassium chloride (KLOR-CON) 10 MEQ tablet TAKE 2 TABLETS(20 MEQ) BY MOUTH DAILY 60 tablet 9   PSYLLIUM PO Take 1 capsule by mouth daily.     silver sulfADIAZINE (SILVADENE) 1 % cream Apply 1 application topically daily. 50 g 1   traMADol (ULTRAM) 50 MG tablet Take 1 tablet (50 mg total) by mouth 2 (two) times daily as needed for moderate pain. 30 tablet 0   traZODone (DESYREL) 50 MG tablet Take 0.5-1 tablets (25-50 mg total) by mouth at bedtime. 30 tablet 3   TRELEGY ELLIPTA 200-62.5-25 MCG/INH AEPB INHALE 1 PUFF INTO THE LUNGS DAILY 60 each 5   vitamin B-12 (CYANOCOBALAMIN) 500 MCG tablet Take 500 mcg by mouth every other day.      warfarin (COUMADIN) 5 MG tablet TAKE 1 TO 2 TABLETS BY MOUTH DAILY AS DIRECTED BY COUMADIN CLINIC 60 tablet 5   Zinc 50 MG CAPS Take 1 capsule  (50 mg total) by mouth daily.  0   Facility-Administered Medications Prior to Visit  Medication Dose Route Frequency Provider Last Rate Last Admin   ondansetron (ZOFRAN) 4 mg in sodium chloride 0.9 % 50 mL IVPB  4 mg Intravenous Q6H PRN Kristeen Miss, MD         Per HPI unless specifically indicated in ROS section below Review of Systems  Objective:  BP 112/68   Pulse (!) 109   Temp 98.3 F (36.8 C) (Temporal)   Ht 5\' 3"  (1.6 m)   Wt 136 lb (61.7 kg)   SpO2 97%   BMI 24.09 kg/m   Wt Readings from Last 3 Encounters:  06/28/21 136 lb (61.7 kg)  06/16/21 138 lb 8 oz (62.8 kg)  05/28/21 137 lb 12.8 oz (62.5 kg)      Physical Exam Vitals and nursing note reviewed.  Constitutional:      Appearance: Normal appearance. She is not ill-appearing.  Musculoskeletal:     Right lower leg: No edema.     Left lower leg: No edema.  Skin:    General: Skin is warm and dry.     Findings: Bruising, lesion and wound present.          Comments:  Healing scab present to L lower anterior leg without significant surrounding erythema 2 skin tears to left lateral forearm - wounds cleaned with saline and dressed with triple abx ointment and tegaderm.   Neurological:     Mental Status: She is alert.  Psychiatric:        Mood and Affect: Mood normal.        Behavior: Behavior normal.      Results for orders placed or performed in visit on 06/25/21  POCT INR  Result Value Ref Range   INR 1.5 (A) 2.0 - 3.0   *Note: Due to a large number of results and/or encounters for the requested time period, some results have not been displayed. A complete set of results can be found in Results Review.    Assessment & Plan:  This visit occurred during the SARS-CoV-2 public health emergency.  Safety protocols were in place, including screening questions prior to the visit, additional usage of staff PPE, and extensive cleaning of exam room while observing appropriate contact time as indicated for  disinfecting solutions.   Problem List Items Addressed This Visit     Leg wound, left - Primary    Recently treated with keflex course with improvement - anticipate will heal  well. Encouraged leg elevation.       Skin tear of left forearm without complication    Skin tears to left lateral forearm suffered last night - wounds cleaned and dressed and home care instructions provided. Update if not improving with treatment.         No orders of the defined types were placed in this encounter.  No orders of the defined types were placed in this encounter.   Patient Instructions  Leg wound is looking better - continue leg elevation, let us know if redness spreading again.  For arm wounds - continue tegaderm dressing changes every 1-2 days, topical antibiotic as well. Let us know if not improving with this.  Good to see you today!  Happy birthday!  Follow up plan: Return if symptoms worsen or fail to improve.  Ria Bush, MD

## 2021-06-28 NOTE — Assessment & Plan Note (Signed)
Recently treated with keflex course with improvement - anticipate will heal well. Encouraged leg elevation.

## 2021-06-28 NOTE — Assessment & Plan Note (Signed)
Skin tears to left lateral forearm suffered last night - wounds cleaned and dressed and home care instructions provided. Update if not improving with treatment.

## 2021-06-30 NOTE — Telephone Encounter (Signed)
Please advise ASAP on your return  Thanks

## 2021-07-02 ENCOUNTER — Ambulatory Visit (INDEPENDENT_AMBULATORY_CARE_PROVIDER_SITE_OTHER): Payer: Medicare Other

## 2021-07-02 ENCOUNTER — Other Ambulatory Visit: Payer: Self-pay

## 2021-07-02 DIAGNOSIS — Z7901 Long term (current) use of anticoagulants: Secondary | ICD-10-CM

## 2021-07-02 LAB — POCT INR: INR: 1.5 — AB (ref 2.0–3.0)

## 2021-07-02 NOTE — Patient Instructions (Addendum)
Pre visit review using our clinic review tool, if applicable. No additional management support is needed unless otherwise documented below in the visit note.  Take 2 tablets today and take 1 1/2 tablets tomorrow and 1 1/2 tablets on Sunday and then change weekly dose to take 1 1/2 tablet daily except take 1  tablets on Mon, Wed, and Fri.  Recheck in 1 wks.

## 2021-07-06 ENCOUNTER — Ambulatory Visit: Payer: Medicare Other | Admitting: Family Medicine

## 2021-07-06 NOTE — Telephone Encounter (Signed)
Please advise 

## 2021-07-09 ENCOUNTER — Ambulatory Visit: Payer: Medicare Other

## 2021-07-09 ENCOUNTER — Other Ambulatory Visit: Payer: Self-pay | Admitting: Family Medicine

## 2021-07-09 NOTE — Telephone Encounter (Signed)
ERx 

## 2021-07-14 ENCOUNTER — Other Ambulatory Visit: Payer: Self-pay | Admitting: Family Medicine

## 2021-07-14 NOTE — Telephone Encounter (Signed)
Duplicate request

## 2021-07-16 ENCOUNTER — Ambulatory Visit (INDEPENDENT_AMBULATORY_CARE_PROVIDER_SITE_OTHER): Payer: Medicare Other

## 2021-07-16 ENCOUNTER — Other Ambulatory Visit: Payer: Self-pay

## 2021-07-16 DIAGNOSIS — Z7901 Long term (current) use of anticoagulants: Secondary | ICD-10-CM | POA: Diagnosis not present

## 2021-07-16 LAB — POCT INR: INR: 2.9 (ref 2.0–3.0)

## 2021-07-16 NOTE — Patient Instructions (Addendum)
Pre visit review using our clinic review tool, if applicable. No additional management support is needed unless otherwise documented below in the visit note.  Continue to take 1 1/2 tablet daily except take 1  tablets on Mon, Wed, and Fri.  Recheck in 3 wks.

## 2021-07-17 ENCOUNTER — Other Ambulatory Visit: Payer: Self-pay | Admitting: Family Medicine

## 2021-07-17 DIAGNOSIS — Z7901 Long term (current) use of anticoagulants: Secondary | ICD-10-CM

## 2021-07-19 NOTE — Telephone Encounter (Signed)
Pt is compliant with coumadin management and provider visits.  Sent in refill

## 2021-07-30 ENCOUNTER — Ambulatory Visit (INDEPENDENT_AMBULATORY_CARE_PROVIDER_SITE_OTHER): Payer: Medicare Other

## 2021-07-30 DIAGNOSIS — I441 Atrioventricular block, second degree: Secondary | ICD-10-CM | POA: Diagnosis not present

## 2021-08-01 LAB — CUP PACEART REMOTE DEVICE CHECK
Battery Remaining Longevity: 50 mo
Battery Remaining Percentage: 47 %
Battery Voltage: 2.96 V
Brady Statistic AP VP Percent: 68 %
Brady Statistic AP VS Percent: 1.3 %
Brady Statistic AS VP Percent: 31 %
Brady Statistic AS VS Percent: 1 %
Brady Statistic RA Percent Paced: 38 %
Brady Statistic RV Percent Paced: 99 %
Date Time Interrogation Session: 20221021071257
Implantable Lead Implant Date: 20170907
Implantable Lead Implant Date: 20170907
Implantable Lead Location: 753859
Implantable Lead Location: 753860
Implantable Pulse Generator Implant Date: 20170907
Lead Channel Impedance Value: 410 Ohm
Lead Channel Impedance Value: 540 Ohm
Lead Channel Pacing Threshold Amplitude: 0.5 V
Lead Channel Pacing Threshold Amplitude: 0.75 V
Lead Channel Pacing Threshold Pulse Width: 0.5 ms
Lead Channel Pacing Threshold Pulse Width: 0.5 ms
Lead Channel Sensing Intrinsic Amplitude: 1.3 mV
Lead Channel Sensing Intrinsic Amplitude: 7.4 mV
Lead Channel Setting Pacing Amplitude: 1 V
Lead Channel Setting Pacing Amplitude: 2 V
Lead Channel Setting Pacing Pulse Width: 0.5 ms
Lead Channel Setting Sensing Sensitivity: 2 mV
Pulse Gen Model: 2272
Pulse Gen Serial Number: 7945283

## 2021-08-06 ENCOUNTER — Other Ambulatory Visit (INDEPENDENT_AMBULATORY_CARE_PROVIDER_SITE_OTHER): Payer: Medicare Other

## 2021-08-06 ENCOUNTER — Ambulatory Visit (INDEPENDENT_AMBULATORY_CARE_PROVIDER_SITE_OTHER): Payer: Medicare Other

## 2021-08-06 ENCOUNTER — Other Ambulatory Visit: Payer: Self-pay

## 2021-08-06 DIAGNOSIS — S51812A Laceration without foreign body of left forearm, initial encounter: Secondary | ICD-10-CM

## 2021-08-06 DIAGNOSIS — Z7901 Long term (current) use of anticoagulants: Secondary | ICD-10-CM

## 2021-08-06 LAB — POCT INR: INR: 3.2 — AB (ref 2.0–3.0)

## 2021-08-06 NOTE — Patient Instructions (Addendum)
Pre visit review using our clinic review tool, if applicable. No additional management support is needed unless otherwise documented below in the visit note.  Take 1/2 tablet tomorrow and then continue to take 1 1/2 tablet daily except take 1  tablets on Mon, Wed, and Fri.  Recheck in 3 wks.

## 2021-08-06 NOTE — Progress Notes (Signed)
Per Dr. Darnell Level, dress wounds on R forearm with tegaderm and antibiotic ointment. Advised pt of care over the weekend. Advised of ER precautions and to contact office next week with update. Pt verbalized understanding.

## 2021-08-07 LAB — CBC WITH DIFFERENTIAL/PLATELET
Basophils Absolute: 0.1 10*3/uL (ref 0.0–0.2)
Basos: 1 %
EOS (ABSOLUTE): 0.1 10*3/uL (ref 0.0–0.4)
Eos: 1 %
Hematocrit: 38.6 % (ref 34.0–46.6)
Hemoglobin: 13.3 g/dL (ref 11.1–15.9)
Immature Grans (Abs): 0 10*3/uL (ref 0.0–0.1)
Immature Granulocytes: 0 %
Lymphocytes Absolute: 0.8 10*3/uL (ref 0.7–3.1)
Lymphs: 13 %
MCH: 32.8 pg (ref 26.6–33.0)
MCHC: 34.5 g/dL (ref 31.5–35.7)
MCV: 95 fL (ref 79–97)
Monocytes Absolute: 0.5 10*3/uL (ref 0.1–0.9)
Monocytes: 9 %
Neutrophils Absolute: 4.4 10*3/uL (ref 1.4–7.0)
Neutrophils: 76 %
Platelets: 133 10*3/uL — ABNORMAL LOW (ref 150–450)
RBC: 4.06 x10E6/uL (ref 3.77–5.28)
RDW: 12.5 % (ref 11.7–15.4)
WBC: 5.8 10*3/uL (ref 3.4–10.8)

## 2021-08-09 NOTE — Progress Notes (Signed)
Remote pacemaker transmission.   

## 2021-08-10 ENCOUNTER — Ambulatory Visit (INDEPENDENT_AMBULATORY_CARE_PROVIDER_SITE_OTHER): Payer: Medicare Other | Admitting: Family Medicine

## 2021-08-10 ENCOUNTER — Other Ambulatory Visit: Payer: Self-pay

## 2021-08-10 ENCOUNTER — Telehealth: Payer: Self-pay | Admitting: Cardiovascular Disease

## 2021-08-10 ENCOUNTER — Encounter: Payer: Self-pay | Admitting: Family Medicine

## 2021-08-10 VITALS — BP 118/68 | HR 79 | Temp 98.2°F | Ht 63.0 in | Wt 141.1 lb

## 2021-08-10 DIAGNOSIS — S51811A Laceration without foreign body of right forearm, initial encounter: Secondary | ICD-10-CM | POA: Insufficient documentation

## 2021-08-10 DIAGNOSIS — Z7901 Long term (current) use of anticoagulants: Secondary | ICD-10-CM

## 2021-08-10 NOTE — Patient Instructions (Signed)
Forearm skin tear dressed with antibiotic ointment and tegaderm - may continue this at home. Use long sleeve thicker sleeves to sleep at night.  Let us know if not improving with this.  Touch base with cardiology about coumadin range goal given increasing skin tears.

## 2021-08-10 NOTE — Assessment & Plan Note (Signed)
Large skin tear to R forearm dressed with triple abx ointment and tegaderm in office. Has been having more skin tears recently. Discussed preventative measures including sleeping with long sleeve shirts, more cushioning. She will check with cardiology about possibly decreasing therapeutic range goals.

## 2021-08-10 NOTE — Progress Notes (Signed)
Patient ID: [name], female    DOB: [birth year], 75 y.o.   MRN: [id number]  This visit was conducted in person.  BP 118/68   Pulse 79   Temp 98.2 F (36.8 C) (Temporal)   Ht 5\' 3"  (1.6 m)   Wt 141 lb 1 oz (64 kg)   SpO2 95%   BMI 24.99 kg/m    CC: wound check Subjective:   HPI: [name] is a 75 y.o. female presenting on 08/10/2021 for Wound Check (C/o large area on right arm that is bleeding.  Started 08/04/21.  Wound is wrapped but continues to bleed.  Also, has area on inside of lower lip. )   See recent coumadin clinic note.  She's had more frequent skin tears especially to R forearm, occurred overnight, unsure how as she wakes up with them, sleeps in recliner.  Goal INR is 2.5-3 given h/o bioprosthetic valve replacement.    Lab Results  Component Value Date   INR 3.2 (A) 08/06/2021   INR 2.9 07/16/2021   INR 1.5 (A) 07/02/2021   Brings Advanced Directive which will be scanned: HCPOA are son Hannalee Castor then [place]. Grants discretion to [facility] if terminal or incurable condition.       Relevant past medical, surgical, family and social history reviewed and updated as indicated. Interim medical history since our last visit reviewed. Allergies and medications reviewed and updated. Outpatient Medications Prior to Visit  Medication Sig Dispense Refill   acetaminophen (TYLENOL) 500 MG tablet Take 2 tablets (1,000 mg total) by mouth daily.     albuterol (VENTOLIN HFA) 108 (90 Base) MCG/ACT inhaler Inhale 2 puffs into the lungs every 6 (six) hours as needed for wheezing. 18 g 6   amoxicillin (AMOXIL) 500 MG capsule TAKE 4 CAPSULES BY MOUTH 30-60 MINUTES PRIOR TO APPOINTMENT 4 capsule 5   Biotin 1000 MCG tablet Take 1,000 mcg by mouth daily.     calcium carbonate (OSCAL) 1500 (600 Ca) MG TABS tablet Take 600 mg of elemental calcium by mouth daily.     carboxymethylcellulose (REFRESH PLUS) 0.5 % SOLN Place 1 drop into both eyes 3  (three) times daily as needed (dry eyes).     cephALEXin (KEFLEX) 250 MG capsule Take 250 mg by mouth daily.     cephALEXin (KEFLEX) 500 MG capsule Take 1 capsule (500 mg total) by mouth 3 (three) times daily. 15 capsule 0   Cholecalciferol (VITAMIN D) 50 MCG (2000 UT) tablet Take 2,000 Units by mouth daily.     docusate sodium (COLACE) 100 MG capsule Take 200 mg by mouth every evening.     enoxaparin (LOVENOX) 60 MG/0.6ML injection Inject 0.30mLs (60mg )  into the skin every 12 hours for 18 doses as directed by coumadin clinic. 10.8 mL 0   furosemide (LASIX) 40 MG tablet Take 1 tablet (40 mg total) by mouth daily. May take an extra tablet daily as needed for swelling. 45 tablet 11   gabapentin (NEURONTIN) 300 MG capsule Take 300 mg by mouth at bedtime.     GEMTESA 75 MG TABS Take 75 mg by mouth daily.     ipratropium-albuterol (DUONEB) 0.5-2.5 (3) MG/3ML SOLN Take 3 mLs by nebulization every 6 (six) hours as needed. 360 mL 0   lovastatin (MEVACOR) 40 MG tablet TAKE 1 TABLET(40 MG) BY MOUTH AT BEDTIME 90 tablet 1   montelukast (SINGULAIR) 10 MG tablet TAKE 1 TABLET(10 MG) BY MOUTH AT BEDTIME 30  tablet 11   Multiple Vitamins-Minerals (PRESERVISION AREDS 2 PO) Take 2 capsules by mouth every evening.      polyethylene glycol (MIRALAX / GLYCOLAX) packet Take 17 g by mouth daily. 14 each 0   potassium chloride (KLOR-CON) 10 MEQ tablet TAKE 2 TABLETS(20 MEQ) BY MOUTH DAILY 60 tablet 9   PSYLLIUM PO Take 1 capsule by mouth daily.     silver sulfADIAZINE (SILVADENE) 1 % cream Apply 1 application topically daily. 50 g 1   traMADol (ULTRAM) 50 MG tablet TAKE 1 TABLET(50 MG) BY MOUTH TWICE DAILY AS NEEDED FOR MODERATE PAIN 30 tablet 0   traZODone (DESYREL) 50 MG tablet Take 0.5-1 tablets (25-50 mg total) by mouth at bedtime. 30 tablet 3   TRELEGY ELLIPTA 200-62.5-25 MCG/INH AEPB INHALE 1 PUFF INTO THE LUNGS DAILY 60 each 5   vitamin B-12 (CYANOCOBALAMIN) 500 MCG tablet Take 500 mcg by mouth every other day.       warfarin (COUMADIN) 5 MG tablet TAKE 1 1/2 (7.5MG ) TABLETS BY MOUTH DAILY EXCEPT TAKE 1 TABLET (5MG ) ON MONDAYS, WEDNESDAYS, AND FRIDAYS OR AS DIRECTED BY COUMADIN CLINIC 60 tablet 5   Zinc 50 MG CAPS Take 1 capsule (50 mg total) by mouth daily.  0   Facility-Administered Medications Prior to Visit  Medication Dose Route Frequency Provider Last Rate Last Admin   ondansetron (ZOFRAN) 4 mg in sodium chloride 0.9 % 50 mL IVPB  4 mg Intravenous Q6H PRN Kristeen Miss, MD         Per HPI unless specifically indicated in ROS section below Review of Systems  Objective:  BP 118/68   Pulse 79   Temp 98.2 F (36.8 C) (Temporal)   Ht 5\' 3"  (1.6 m)   Wt 141 lb 1 oz (64 kg)   SpO2 95%   BMI 24.99 kg/m   Wt Readings from Last 3 Encounters:  08/10/21 141 lb 1 oz (64 kg)  06/28/21 136 lb (61.7 kg)  06/16/21 138 lb 8 oz (62.8 kg)      Physical Exam Vitals and nursing note reviewed.  Constitutional:      Appearance: Normal appearance. She is not ill-appearing.  Skin:    General: Skin is warm and dry.     Findings: Bruising, ecchymosis and signs of injury present.          Comments: Multiple small skin tears to R forearm, one larger one proximally, without surrounding erythema   Neurological:     Mental Status: She is alert.      Results for orders placed or performed in visit on 08/06/21  CBC w/Diff  Result Value Ref Range   WBC 5.8 3.4 - 10.8 x10E3/uL   RBC 4.06 3.77 - 5.28 x10E6/uL   Hemoglobin 13.3 11.1 - 15.9 g/dL   Hematocrit 38.6 34.0 - 46.6 %   MCV 95 79 - 97 fL   MCH 32.8 26.6 - 33.0 pg   MCHC 34.5 31.5 - 35.7 g/dL   RDW 12.5 11.7 - 15.4 %   Platelets 133 (L) 150 - 450 x10E3/uL   Neutrophils 76 Not Estab. %   Lymphs 13 Not Estab. %   Monocytes 9 Not Estab. %   Eos 1 Not Estab. %   Basos 1 Not Estab. %   Neutrophils Absolute 4.4 1.4 - 7.0 x10E3/uL   Lymphocytes Absolute 0.8 0.7 - 3.1 x10E3/uL   Monocytes Absolute 0.5 0.1 - 0.9 x10E3/uL   EOS (ABSOLUTE) 0.1 0.0 -  0.4 x10E3/uL  Basophils Absolute 0.1 0.0 - 0.2 x10E3/uL   Immature Granulocytes 0 Not Estab. %   Immature Grans (Abs) 0.0 0.0 - 0.1 x10E3/uL   *Note: Due to a large number of results and/or encounters for the requested time period, some results have not been displayed. A complete set of results can be found in Results Review.    Assessment & Plan:  This visit occurred during the SARS-CoV-2 public health emergency.  Safety protocols were in place, including screening questions prior to the visit, additional usage of staff PPE, and extensive cleaning of exam room while observing appropriate contact time as indicated for disinfecting solutions.   Problem List Items Addressed This Visit     Anticoagulant long-term use    INR range currently 2.5-3 in h/o bioprosthetic valve replacement x2. Doubt she can lower goal range, but she will touch base with cardiology about that.       Skin tear of right forearm without complication - Primary    Large skin tear to R forearm dressed with triple abx ointment and tegaderm in office. Has been having more skin tears recently. Discussed preventative measures including sleeping with long sleeve shirts, more cushioning. She will check with cardiology about possibly decreasing therapeutic range goals.         No orders of the defined types were placed in this encounter.  No orders of the defined types were placed in this encounter.   Patient Instructions  Forearm skin tear dressed with antibiotic ointment and tegaderm - may continue this at home. Use long sleeve thicker sleeves to sleep at night.  Let us know if not improving with this.  Touch base with cardiology about coumadin range goal given increasing skin tears.   Follow up plan: Return if symptoms worsen or fail to improve.  Ria Bush, MD

## 2021-08-10 NOTE — Assessment & Plan Note (Signed)
INR range currently 2.5-3 in h/o bioprosthetic valve replacement x2. Doubt she can lower goal range, but she will touch base with cardiology about that.

## 2021-08-10 NOTE — Telephone Encounter (Signed)
Returned the call to the patient. She stated that she has been having problems with her coumadin levels and bleeding. This is monitored by her PCP whom she saw today. Per the patient, her pcp has requested that she be seen by Dr. Sallyanne Kuster sooner than December.   Appointment made for 11/9. She has been advised to call back if anything is needed sooner.

## 2021-08-10 NOTE — Telephone Encounter (Signed)
Patient states when she woke up Thursday morning, 10/27, her right arm was bleeding in 2 spots. She had a coumadin appointment with [facility] who sent a photo to her primary doctor, Dr. Danise Mina. She saw PCP today, but she states her arm is still bruised and she is still unsure of what to do\. Please advise when able.

## 2021-08-12 ENCOUNTER — Other Ambulatory Visit: Payer: Self-pay | Admitting: Internal Medicine

## 2021-08-16 ENCOUNTER — Other Ambulatory Visit: Payer: Self-pay

## 2021-08-16 ENCOUNTER — Ambulatory Visit (INDEPENDENT_AMBULATORY_CARE_PROVIDER_SITE_OTHER)
Admission: RE | Admit: 2021-08-16 | Discharge: 2021-08-16 | Disposition: A | Discharge status: Home / Self Care | Payer: Medicare Other | Source: Ambulatory Visit | Attending: Internal Medicine | Admitting: Internal Medicine

## 2021-08-16 DIAGNOSIS — R918 Other nonspecific abnormal finding of lung field: Secondary | ICD-10-CM | POA: Diagnosis not present

## 2021-08-16 DIAGNOSIS — J449 Chronic obstructive pulmonary disease, unspecified: Secondary | ICD-10-CM | POA: Diagnosis not present

## 2021-08-18 ENCOUNTER — Encounter: Payer: Self-pay | Admitting: Cardiovascular Disease

## 2021-08-18 ENCOUNTER — Ambulatory Visit (INDEPENDENT_AMBULATORY_CARE_PROVIDER_SITE_OTHER): Payer: Medicare Other | Admitting: Cardiovascular Disease

## 2021-08-18 ENCOUNTER — Other Ambulatory Visit: Payer: Self-pay

## 2021-08-18 VITALS — BP 116/60 | HR 70 | Resp 20 | Ht 64.0 in | Wt 140.6 lb

## 2021-08-18 DIAGNOSIS — T82857D Stenosis of cardiac prosthetic devices, implants and grafts, subsequent encounter: Secondary | ICD-10-CM

## 2021-08-18 DIAGNOSIS — I441 Atrioventricular block, second degree: Secondary | ICD-10-CM | POA: Diagnosis not present

## 2021-08-18 DIAGNOSIS — A310 Pulmonary mycobacterial infection: Secondary | ICD-10-CM

## 2021-08-18 DIAGNOSIS — I4819 Other persistent atrial fibrillation: Secondary | ICD-10-CM

## 2021-08-18 DIAGNOSIS — I5032 Chronic diastolic (congestive) heart failure: Secondary | ICD-10-CM | POA: Diagnosis not present

## 2021-08-18 DIAGNOSIS — I700 Atherosclerosis of aorta: Secondary | ICD-10-CM

## 2021-08-18 DIAGNOSIS — R413 Other amnesia: Secondary | ICD-10-CM

## 2021-08-18 DIAGNOSIS — E7800 Pure hypercholesterolemia, unspecified: Secondary | ICD-10-CM

## 2021-08-18 DIAGNOSIS — H53132 Sudden visual loss, left eye: Secondary | ICD-10-CM

## 2021-08-18 DIAGNOSIS — I7121 Aneurysm of the ascending aorta, without rupture: Secondary | ICD-10-CM

## 2021-08-18 DIAGNOSIS — J449 Chronic obstructive pulmonary disease, unspecified: Secondary | ICD-10-CM

## 2021-08-18 DIAGNOSIS — Z953 Presence of xenogenic heart valve: Secondary | ICD-10-CM

## 2021-08-18 DIAGNOSIS — Z950 Presence of cardiac pacemaker: Secondary | ICD-10-CM

## 2021-08-18 DIAGNOSIS — M4810 Ankylosing hyperostosis [Forestier], site unspecified: Secondary | ICD-10-CM

## 2021-08-18 DIAGNOSIS — I2510 Atherosclerotic heart disease of native coronary artery without angina pectoris: Secondary | ICD-10-CM

## 2021-08-18 MED ORDER — APIXABAN 5 MG PO TABS: 5.0000 mg | ORAL_TABLET | Freq: Two times a day (BID) | ORAL | 11 refills | Status: DC

## 2021-08-18 NOTE — Progress Notes (Signed)
Cardiology office note   Date:  08/22/2021   ID:  [name], [place] 08-05-1946, MRN [id number]  Patient Location: Home Provider Location: Home  PCP:  Ria Bush, MD  Cardiologist:  Sanda Klein, MD  Electrophysiologist:  None   Evaluation Performed:  Follow-Up Visit  Chief Complaint: CHF, AFib, easy bruising/bleeding  History of Present Illness:    [name] is a 75 y.o. 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, [place], 2017), (typical?) atrial flutter (overdrive pacing March 1751).  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   She has had repeated episodes of transient partial visual field cut in her left eye, lasting for a few minutes at a time.  Usually its the bottom half of her visual field that she loses.  Initial concern that this may be related to thromboembolic events is felt to likely anymore, since the episodes are repetitive and stereotypical always in the same distribution.  Her previous ophthalmological evaluation also "did not show anything wrong"  She went to see a neurologist (Dr. Leta Baptist) because of her concerns with memory problems and difficulty finding words.  He felt that he focused only on her gait and recommended a walker which she does not want to use.  She does not think her concerns about her memory were ever addressed.  In the past, she has told me that she is worried about her balance and that she is afraid that she would fall, although she has not had any actual falls.  Check she scored 30 on the MMSE performed on 05/28/2021.  This is an improvement compared to years past.  She has a lot of problems with easy bruising.  She woke up bleeding  from her right forearm and the wound there took a very long time to heal.  She would like to stop taking warfarin.  Her INR has indeed been quite variable and it is hard to keep her in target range.  We switched her from Xarelto to warfarin when there was evidence of increased mitral valve gradients apparently due to leaflet thrombosis.  She does not have orthopnea, PND, chest pain at rest or with activity, palpitations, dizziness or syncope.  She has intermittent right ankle swelling.  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  She's lost a tremendous amount of weight over the last couple of years and her BMI is now down to 24.  Interrogation of her pacemaker shows a marked reduction in the burden of atrial fibrillation.  She spent an entire year in persistent atrial fibrillation for most of 2021 and beginning of 2022, but currently her A. fib burden is down to 30%.  She has 39% atrial pacing and 99% ventricular pacing.  Pacemaker function is normal.  She has a battery longevity expectation of 4.2 years.  She presents in a paced V paced rhythm today.  Last episode of atrial fibrillation happened about a month ago and lasted for about a week.  She has a distinct positive R wave in lead V1 on her ECG.  Her pacemaker is a [employer] implanted in 2017, both leads are [employer] 2088, so her system is MRI conditional.    She has a very mild ascending aortic aneurysm that  has been stable in size, most recently 4.2 cm on 08/16/2021.  She does have scattered aortic atherosclerosis.  On this last scan there appeared to be improvement in the areas of bronchiectasis/groundglass opacities represent MAC infection.  She had a CT myelogram on May 08 2019.  There was severe left foraminal stenosis at L3-4 as well as moderate left foraminal stenosis at C4-5-6.  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   Past Medical History:  Diagnosis Date   Acute cystitis without hematuria 12/22/2015   Anemia     Arthritis    CAP (community acquired pneumonia) 02/08/2016   Cataracts, bilateral    immature   Chronic cholecystitis with calculus 01/21/2015   S/p cholecystectomy    Chronic diastolic heart failure ([place]) 09/19/2014   Chronic insomnia    Constipation    takes Miralax daily as needed   COPD (chronic obstructive pulmonary disease) (HCC)    Albuterol inhaler daily as needed;Duoneb daily as needed;Spiriva daily   DDD (degenerative disc disease)    cervical - kyphosis with mod DD changes C5/6 and C6/7; lumbar - early DD at L2/3 ([place])   Dyspnea    with exertion   Essential hypertension    was on meds but after appointment Dec 11 with Dr.C he took her off   GERD (gastroesophageal reflux disease)    takes Protonix daily   History of pneumonia 02/16/2016   History of radiation therapy 05/30/11 to 07/07/11   rectum   History of shingles    Hyperlipemia    takes Lovastatin daily   Legally blind in left eye, as defined in [place]    Lung nodule    RLL nodule-37m stable 2006, April 2009, and June 2009   Obesity    Osteopenia 12/2014   T -1.5 hip   Pancreatitis 11/2014   ?zpack related vs gallstone pancreatitis with abnormal HIDA scan pending cholecystectomy   PONV (postoperative nausea and vomiting)    Presence of permanent cardiac pacemaker    [place]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; s/p lap LAR, s/p ileostomy, s/p reversal, s/p chemo   Research study patient 08/26/2016   Rheumatic heart disease mitral stenosis    mod MS by echo 02/2014   S/P AVR (aortic valve replacement) 2015   bioprosthetic ([place])   S/P MVR (mitral valve replacement) 2015   bioprosthetic ([place])   Seizures (HCC)    febrile seizures as a child   Sepsis secondary to UTI ([facility] 08/22/2017   Severe aortic valve stenosis  AVR 3/15 10/01/2013   mild-mod by echo 02/2014   Sleep apnea    PT TOLD BORDERLINE-TRIED CPAP-DID NOT HELP-SHE DOES NOT USE CPAP   Small bowel obstruction, partial ([facility] 08/2013   reolved without NGT  placement.    Vitamin B12 deficiency 02/28/2015   Start B12 shots 02/2015    Past Surgical History:  Procedure Laterality Date   ANTERIOR LAT LUMBAR FUSION Left 07/17/2018   Procedure: Lumbar Two-Three Lumbar Three-Four Anterolateral decompression/interbody fusion with lateral plate fixation/Infuse;  Surgeon: EKristeen Miss MD;  [facility]  Service: Neurosurgery;  Laterality: Left;  Lumbar Two-Three Lumbar Three-Four Anterolateral decompression/interbody fusion with lateral plate fixation/Infuse   AORTIC VALVE REPLACEMENT N/A 12/12/2013   Procedure: AORTIC VALVE REPLACEMENT (AVR);  Surgeon: BGaye Pollack MD; Service: Open Heart Surgery   BACK SURGERY  2006, 2007   2006 SPACER, 2007 decompression and fusion L4/5   BOWEL RESECTION  04/02/2012   Procedure: SMALL BOWEL RESECTION;  Surgeon:  Stark Klein, MD;  [facility];  Service: General;  Laterality: N/A;   BRONCHIAL WASHINGS  03/24/2021   Procedure: BRONCHIAL WASHINGS;  Surgeon: Brand Males, MD;  [facility];  Service: Endoscopy;;   CARPAL TUNNEL RELEASE Bilateral    CATARACT EXTRACTION W/ INTRAOCULAR LENS IMPLANT Left 10/18/2016   CATARACT EXTRACTION W/ INTRAOCULAR LENS IMPLANT Right 12/13/2016   Dr. Kathrin Penner   CHOLECYSTECTOMY N/A 01/21/2015   chronic cholecystitis, Stark Klein, MD   COLON RESECTION  08/25/2011   Procedure: COLON RESECTION LAPAROSCOPIC;  Surgeon: Stark Klein, MD;  [facility];  Service: General;  Laterality: N/A;  Laparoscopic Assisted Low Anterior Resection Diverting Ostomy and onQ pain pump   COLONOSCOPY  03/2013   1 polyp, rpt 3 yrs Ardis Hughs)   COLONOSCOPY  05/2021   diverticulosis and patent colon anastomosis with some edema/narrowing, rpt 5 yrs Ardis Hughs)   COLONOSCOPY WITH PROPOFOL N/A 06/09/2016   patent colo-colonic anastomosis, rpt 5 yrs Ardis Hughs)   EP IMPLANTABLE DEVICE N/A 06/16/2016   Procedure: Pacemaker Implant;  Surgeon: Will Meredith Leeds, MD;  Location: [facility]  CV [facility];  Service: Cardiovascular;  Laterality: N/A;   FOOT SURGERY  left foot   hammer toe   ILEOSTOMY  08/25/2011   ILEOSTOMY CLOSURE  04/02/2012   Procedure: ILEOSTOMY TAKEDOWN;  Surgeon: Stark Klein, MD;  [facility];  Service: General;  Laterality: N/A;   INTRAOPERATIVE TRANSESOPHAGEAL ECHOCARDIOGRAM N/A 12/12/2013   Procedure: INTRAOPERATIVE TRANSESOPHAGEAL ECHOCARDIOGRAM;  Surgeon: Gaye Pollack, MD;  Location: [facility];  Service: Open Heart Surgery;  Laterality: N/A;   LEFT AND RIGHT HEART CATHETERIZATION WITH CORONARY ANGIOGRAM N/A 11/27/2013   Procedure: LEFT AND RIGHT HEART CATHETERIZATION WITH CORONARY ANGIOGRAM;  Surgeon: Sanda Klein, MD;  Location: [facility];  Service: Cardiovascular;  Laterality: N/A;   MITRAL VALVE REPLACEMENT N/A 12/12/2013   Procedure: MITRAL VALVE (MV) REPLACEMENT OR REPAIR;  Surgeon: Gaye Pollack, MD; Service: Open Heart Surgery   TEE WITHOUT CARDIOVERSION N/A 11/29/2013   Procedure: TRANSESOPHAGEAL ECHOCARDIOGRAM (TEE);  Surgeon: Sueanne Margarita, MD;  Location[facility];  Service: Cardiovascular;  Laterality: N/A;   TEE WITHOUT CARDIOVERSION N/A 06/04/2019   Procedure: TRANSESOPHAGEAL ECHOCARDIOGRAM (TEE);  Surgeon: Sanda Klein, MD;  Location: [facility];  Service: Cardiovascular;  Laterality: N/A;   TONSILLECTOMY     VIDEO BRONCHOSCOPY N/A 03/24/2021   Procedure: VIDEO BRONCHOSCOPY WITHOUT FLUORO;  Surgeon: Brand Males, MD;  [facility];  Service: Endoscopy;  Laterality: N/A;  SMALL SCOPE,PREMEDICATE WITH NEBULIZED LIDOCAINE     Current Meds  Medication Sig   acetaminophen (TYLENOL) 500 MG tablet Take 2 tablets (1,000 mg total) by mouth daily.   albuterol (VENTOLIN HFA) 108 (90 Base) MCG/ACT inhaler Inhale 2 puffs into the lungs every 6 (six) hours as needed for wheezing.   amoxicillin (AMOXIL) 500 MG capsule TAKE 4 CAPSULES BY MOUTH 30-60 MINUTES PRIOR TO APPOINTMENT   apixaban (ELIQUIS) 5 MG TABS tablet Take 1  tablet (5 mg total) by mouth 2 (two) times daily.   Biotin 1000 MCG tablet Take 1,000 mcg by mouth daily.   calcium carbonate (OSCAL) 1500 (600 Ca) MG TABS tablet Take 600 mg of elemental calcium by mouth daily.   carboxymethylcellulose (REFRESH PLUS) 0.5 % SOLN Place 1 drop into both eyes 3 (three) times daily as needed (dry eyes).   cephALEXin (KEFLEX) 500 MG capsule Take 1 capsule (500 mg total) by mouth 3 (three) times daily.   Cholecalciferol (VITAMIN D) 50 MCG (2000 UT)  tablet Take 2,000 Units by mouth daily.   docusate sodium (COLACE) 100 MG capsule Take 200 mg by mouth every evening.   furosemide (LASIX) 40 MG tablet Take 1 tablet (40 mg total) by mouth daily. May take an extra tablet daily as needed for swelling.   gabapentin (NEURONTIN) 300 MG capsule Take 300 mg by mouth at bedtime.   GEMTESA 75 MG TABS Take 75 mg by mouth daily.   ipratropium-albuterol (DUONEB) 0.5-2.5 (3) MG/3ML SOLN Take 3 mLs by nebulization every 6 (six) hours as needed.   lovastatin (MEVACOR) 40 MG tablet TAKE 1 TABLET(40 MG) BY MOUTH AT BEDTIME   montelukast (SINGULAIR) 10 MG tablet TAKE 1 TABLET(10 MG) BY MOUTH AT BEDTIME   Multiple Vitamins-Minerals (PRESERVISION AREDS 2 PO) Take 2 capsules by mouth every evening.    polyethylene glycol (MIRALAX / GLYCOLAX) packet Take 17 g by mouth daily.   potassium chloride (KLOR-CON) 10 MEQ tablet TAKE 2 TABLETS(20 MEQ) BY MOUTH DAILY   silver sulfADIAZINE (SILVADENE) 1 % cream Apply 1 application topically daily.   traMADol (ULTRAM) 50 MG tablet TAKE 1 TABLET(50 MG) BY MOUTH TWICE DAILY AS NEEDED FOR MODERATE PAIN   traZODone (DESYREL) 50 MG tablet Take 0.5-1 tablets (25-50 mg total) by mouth at bedtime.   TRELEGY ELLIPTA 200-62.5-25 MCG/ACT AEPB INHALE 1 PUFF INTO THE LUNGS DAILY   vitamin B-12 (CYANOCOBALAMIN) 500 MCG tablet Take 500 mcg by mouth every other day.    Zinc 50 MG CAPS Take 1 capsule (50 mg total) by mouth daily.   [DISCONTINUED] cephALEXin (KEFLEX) 250  MG capsule Take 250 mg by mouth daily.   [DISCONTINUED] enoxaparin (LOVENOX) 60 MG/0.6ML injection Inject 0.47ms (674m  into the skin every 12 hours for 18 doses as directed by coumadin clinic.   [DISCONTINUED] warfarin (COUMADIN) 5 MG tablet TAKE 1 1/2 (7.5MG) TABLETS BY MOUTH DAILY EXCEPT TAKE 1 TABLET (5MG) ON MONDAYS, WEDNESDAYS, AND FRIDAYS OR AS DIRECTED BY COUMADIN CLINIC     Allergies:   Codeine, Sulfa antibiotics, and Sulfonamide derivatives   Social History   Tobacco Use   Smoking status: Former    Packs/day: 1.50    Years: 10.00    Pack years: 15.00    Types: Cigarettes    Quit date: 12/28/1974    Years since quitting: 46.6   Smokeless tobacco: Never   Tobacco comments:    quit smoking in 1976  Vaping Use   Vaping Use: Never used  Substance Use Topics   Alcohol use: Not Currently    Alcohol/week: 2.0 standard drinks    Types: 2 Glasses of wine per week    Comment: very rare   Drug use: No     Family Hx: The patient's family history includes Breast cancer (age of onset: 8366in her maternal aunt; CAD (age of onset: 3960in her son; Diabetes in her mother; Heart attack in her maternal grandfather; Heart attack (age of onset: 3963in her son; Heart disease in her mother; Hypertension in her mother; Lung cancer in her father; Stroke in her mother. There is no history of Rectal cancer, Stomach cancer, Esophageal cancer, or Colon cancer.  ROS:   Please see the history of present illness.     All other systems reviewed and are negative.   Prior CV studies:   The following studies were reviewed today:  Echocardiogram 03/26/2021  1. S/P AVR with mean gradient of 14 mmHg and trace AI; S/P MVR with mean  gradient of 7 mmHg.  2. Left ventricular ejection fraction, by estimation, is 60 to 65%. The  left ventricle has normal function. The left ventricle has no regional  wall motion abnormalities. There is mild left ventricular hypertrophy.  Left ventricular diastolic  parameters  are indeterminate. Elevated left atrial pressure.   3. Right ventricular systolic function is normal. The right ventricular  size is mildly enlarged. There is mildly elevated pulmonary artery  systolic pressure.   4. Left atrial size was severely dilated.   5. The mitral valve has been repaired/replaced. No evidence of mitral  valve regurgitation. Mild mitral stenosis. There is a bioprosthetic valve  present in the mitral position.   6. The aortic valve has been repaired/replaced. Aortic valve  regurgitation is trivial. No aortic stenosis is present. There is a  bioprosthetic valve present in the aortic position.   7. Aortic dilatation noted. There is mild dilatation of the aortic root,  measuring 43 mm. There is mild dilatation of the ascending aorta,  measuring 43 mm.   8. The inferior vena cava is normal in size with greater than 50%  respiratory variability, suggesting right atrial pressure of 3 mmHg.  MV Peak grad:  17.1 mmHg  MV Mean grad:  7.0 mmHg      MV Vmax:       2.07 m/s      MV Decel Time: 392 msec    Labs/Other Tests and Data Reviewed:   CT angiogram of head and neck 06/18/2021 No evidence of acute brain infarction. Chronic small-vessel ischemic change of the white matter. Old lacunar infarction left caudate head. No intracranial large vessel occlusion or correctable proximal stenosis. Aortic atherosclerosis. Atherosclerotic disease at both carotid artery bifurcations. 30-40% stenosis of the proximal ICA on the right. 30% stenosis of the proximal ICA on the left.  EKG: Electrocardiogram was checked today, personally reviewed, shows atrial paced, ventricular paced rhythm with a rather broad QRS at 204 ms, with positive R wave in lead V1.  QTc 552 ms.  Similar to previous tracings.  On my review of her chest CTs it's highly suspicious that the tip of the right ventricular pacing lead is in the pericardial space.  This would explain the positive R waves in  leads V1.  Recent Labs: 01/08/2021: TSH 2.08 06/09/2021: ALT 41; BUN 17; Creatinine, Ser 0.88; Potassium 4.0; Sodium 139 08/06/2021: Hemoglobin 13.3; Platelets 133   Recent Lipid Panel Lab Results  Component Value Date/Time   CHOL 171 06/09/2021 08:14 AM   TRIG 119.0 06/09/2021 08:14 AM   HDL 59.00 06/09/2021 08:14 AM   CHOLHDL 3 06/09/2021 08:14 AM   LDLCALC 88 06/09/2021 08:14 AM   LDLDIRECT 111.0 06/26/2015 09:28 AM    Wt Readings from Last 3 Encounters:  08/18/21 140 lb 9.6 oz (63.8 kg)  08/10/21 141 lb 1 oz (64 kg)  06/28/21 136 lb (61.7 kg)     Objective:    Vital Signs:  BP 116/60 (BP Location: Left Arm, Patient Position: Sitting, Cuff Size: Normal)   Pulse 70   Resp 20   Ht _0  (1.626 m)   Wt 140 lb 9.6 oz (63.8 kg)   SpO2 95%   BMI 24.13 kg/m       General: Alert, oriented x3, no distress, well-healed pacemaker site Head: no evidence of trauma, PERRL, EOMI, no exophtalmos or lid lag, no myxedema, no xanthelasma; normal ears, nose and oropharynx Neck: normal jugular venous pulsations and no hepatojugular reflux; brisk carotid pulses without delay and no  carotid bruits Chest: clear to auscultation, no signs of consolidation by percussion or palpation, normal fremitus, symmetrical and full respiratory excursions Cardiovascular: normal position and quality of the apical impulse, regular rhythm, normal first and widely split second heart sounds, 1/6 aortic ejection murmur, no diastolic murmurs, rubs or gallops Abdomen: no tenderness or distention, no masses by palpation, no abnormal pulsatility or arterial bruits, normal bowel sounds, no hepatosplenomegaly Extremities: no clubbing, cyanosis or edema; 2+ radial, ulnar and brachial pulses bilaterally; 2+ right femoral, posterior tibial and dorsalis pedis pulses; 2+ left femoral, posterior tibial and dorsalis pedis pulses; no subclavian or femoral bruits Neurological: grossly nonfocal Psych: Normal mood and  affect     ASSESSMENT & PLAN:    1. Chronic diastolic heart failure (HCC)   2. Stenosis of prosthetic mitral valve, subsequent encounter   3. History of aortic valve replacement with bioprosthetic valve   4. Second degree AV block   5. Pacemaker   6. Persistent atrial fibrillation ([place])   7. Chronic obstructive pulmonary disease, unspecified COPD type ([place])   8. 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 Acute loss of vision, left   10. Aneurysm of ascending aorta without rupture   11. Hypercholesterolemia   12. DISH (diffuse idiopathic skeletal hyperostosis)   13. Memory loss   14. Atherosclerosis of aorta ([employer])   15. ASCVD (arteriosclerotic cardiovascular disease)       CHF: Currently appears to be asymptomatic, NYHA functional class I, euvolemic on a relatively low dose of loop diuretic.  She takes an extra dose of furosemide roughly once a week for swelling in her ankle, not for shortness of breath.  Previously had decompensation due to mitral bioprosthetic valve thrombosis and increased mitral stenosis. Prosthetic MV stenosis: Transient, related to thrombotic leaflet restriction.  Mean mitral valve gradients have been stable at around 7-9 mmHg, most recently evaluated in June 2022.  She really wants to stop taking warfarin. We will use Eliquis rather than Xarelto, may be at a more stable level of antiten A activity will show benefit.  Recheck echo in a couple of months S/p AVR: Aortic valve biological prosthesis is functioning normally with stable gradients on echo in June 2022.  She is aware of the need for endocarditis prophylaxis.  2nd deg AV block: Due to long AV delays, have been unable to avoid ventricular pacing, but thankfully this has not been associated with worsening heart failure. Pacemaker: Normal device function.  Followed by Dr. Curt Bears. Atrial fibrillation: There is been a remarkable reduction of the burden of atrial fibrillation over the last year or so.   She does not seem to have any change in her symptoms whether or not she is in normal rhythm or atrial fibrillation. . Anticoagulation: She has led to a lot of bleeding problems on warfarin (albeit not life-threatening) and her INR has been very difficult to maintain in therapeutic range.  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  We will switch back to a direct oral anticoagulant and recheck an echocardiogram in a couple of months.   History of syncope: No current complaints , history of syncope on higher doses of bisoprolol related to orthostatic hypotension.  Tolerating metoprolol well.   COPD: No recent episodes of exacerbation.  Denies wheezing.  Continue Trelegy. 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  When she had her initial event, I was concerned that she had thromboembolism from her dysfunctional biological prosthesis and/or atrial fibrillation.  Since the episodes are repetitive, this makes cardioembolic events virtually impossible.  It suggests a local problem.  She tells me that ophthalmological exams have been normal.  She also has problems with balance and has newly discovered problems finding words.  She would be a very unusual age to develop multiple sclerosis.  Consider temporal arteritis, but she does not have headaches or temporal tenderness, systemic symptoms or a history of collagen vascular disease. Has mild macrocytosis, but normal B12 level 05/2020.  I am puzzled by her neurological complaints and have recommended evaluation by a specialist.  She would like a new opinion and I referred her to [facility]. Ascending aortic  aneurysm: Very mild increase in diameter 4.2 cm, essentially stable in size on serial imaging studies  HLP: All lipid parameters are in target range.  Continue statin.  DISH: spinal injection did not help.  Limits her activity.  Evaluated by neurology as well who recommended using a cane or walker. Memory problems: Did well on MMSE (score 30).  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  Note is made of atherosclerotic calcification of the intracranial arteries at the base of the brain..  According to Dr. Gladstone Lighter note, he thinks the memory issue is mostly related to anxiety and insomnia and he did not recommend specific evaluation or treatment. ASCVD: Has aortic atherosclerosis. 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  No significant stenosis of the intracranial circulation.   Patient Instructions  Medication Instructions:  STOP the Warfarin  START Eliquis 5 mg twice daily  *If you need a refill on your cardiac medications before your next appointment, please call your pharmacy*   Lab Work: None ordered If you have labs (blood work) drawn today and your tests are completely normal, you will receive your results only by: [place] (if you have MyChart) OR A paper copy in the mail If you have any lab test that is abnormal or we need to change your treatment, we will call you to review the results.   Testing/Procedures: Your physician has requested that you have an echocardiogram in late December. Echocardiography is a painless test that uses sound waves to create images of your heart. It provides your doctor with information about the size and shape of  your heart and how well your heart's chambers and valves are working. You may receive an ultrasound enhancing agent through an IV if needed to better visualize your heart during the echo.This procedure takes approximately one hour. There are no restrictions for this procedure. This will take place at the [address].    Follow-Up: At [facility], you and your health needs are our priority.  As part of our continuing mission to provide you with exceptional heart care, we have created designated Provider Care Teams.  These Care Teams include your primary Cardiologist (physician) and Advanced Practice Providers (APPs -  Physician  Assistants and Nurse Practitioners) who all work together to provide you with the care you need, when you need it.  We recommend signing up for the patient portal called "MyChart".  Sign up information is provided on this After Visit Summary.  MyChart is used to connect with patients for Virtual Visits (Telemedicine).  Patients are able to view lab/test results, encounter notes, upcoming appointments, etc.  Non-urgent messages can be sent to your provider as well.   To learn more about what you can do with MyChart, go to [email or web address].    Your next appointment:   Follow up with Dr. Sallyanne Kuster, first available on a device day   Other Instructions A referral  has been placed to Neurology   Signed, Sanda Klein, MD  08/22/2021 1:03 PM    [place]

## 2021-08-18 NOTE — Patient Instructions (Addendum)
Medication Instructions:  STOP the Warfarin  START Eliquis 5 mg twice daily  *If you need a refill on your cardiac medications before your next appointment, please call your pharmacy*   Lab Work: None ordered If you have labs (blood work) drawn today and your tests are completely normal, you will receive your results only by: [place] (if you have MyChart) OR A paper copy in the mail If you have any lab test that is abnormal or we need to change your treatment, we will call you to review the results.   Testing/Procedures: Your physician has requested that you have an echocardiogram in late December. Echocardiography is a painless test that uses sound waves to create images of your heart. It provides your doctor with information about the size and shape of your heart and how well your heart's chambers and valves are working. You may receive an ultrasound enhancing agent through an IV if needed to better visualize your heart during the echo.This procedure takes approximately one hour. There are no restrictions for this procedure. This will take place at the [address], [address].    Follow-Up: At [facility], you and your health needs are our priority.  As part of our continuing mission to provide you with exceptional heart care, we have created designated Provider Care Teams.  These Care Teams include your primary Cardiologist (physician) and Advanced Practice Providers (APPs -  Physician Assistants and Nurse Practitioners) who all work together to provide you with the care you need, when you need it.  We recommend signing up for the patient portal called "MyChart".  Sign up information is provided on this After Visit Summary.  MyChart is used to connect with patients for Virtual Visits (Telemedicine).  Patients are able to view lab/test results, encounter notes, upcoming appointments, etc.  Non-urgent messages can be sent to your provider as well.   To learn more about what  you can do with MyChart, go to [email or web address].    Your next appointment:   Follow up with Dr. Sallyanne Kuster, first available on a device day   Other Instructions A referral  has been placed to Neurology

## 2021-08-20 ENCOUNTER — Other Ambulatory Visit: Payer: Self-pay

## 2021-08-20 NOTE — Telephone Encounter (Signed)
Name of Medication: Tramadol Name of Pharmacy: [facility] [place] or Written Date and Quantity: 07/16/21, #30 Last Office Visit and Type: 08/10/21, skin tear of R forearm Next Office Visit and Type: none Last Controlled Substance Agreement Date: none Last UDS: none

## 2021-08-23 ENCOUNTER — Telehealth: Payer: Self-pay

## 2021-08-23 ENCOUNTER — Encounter: Payer: Self-pay | Admitting: Neurology

## 2021-08-23 MED ORDER — TRAMADOL HCL 50 MG PO TABS: ORAL_TABLET | ORAL | 0 refills | Status: DC

## 2021-08-23 NOTE — Telephone Encounter (Signed)
ERx 

## 2021-08-23 NOTE — Telephone Encounter (Signed)
Noted! Thank you

## 2021-08-23 NOTE — Telephone Encounter (Signed)
Contacted pt concerning her apt this Friday, 11/18, which needs to be moved earlier or later in the day due to this nurse having a meeting.  Pt reports she saw her cardiologist on 11/9 and discussed with him stopping warfarin due to excessive bruising and bleeding. She reports Dr. Sallyanne Kuster stopped warfarin and started pt on Eliquis, 5mg  BID. She is to take Eliquis for approximately 2 months and then have an echocardiogram on 10/13/20 and return visit with cardiology on 10/18/20. Pt did not know she would not have to have INR checked when taking Eliquis. Advised pt no INR is necessary. Advised pt of signs and symptoms of blood clot to watch out for as well as excessive bruising or bleeding. Advised if she had any of these signs or symptoms to contact cardiology and if difficulty contacting that office to contact PCP office. Advised of ER precautions.Pt verbalized understanding and was very appreciative. Advised this nurse will f/u with her after her echo and cardiology apt to see how she is doing on Eliquis.   Cancelled apt on 11/18 for coumadin clinic.   Part of cardiology note from 11/9 below;  Anticoagulation: She has led to a lot of bleeding problems on warfarin (albeit not life-threatening) and her INR has been very difficult to maintain in therapeutic range.  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  We will switch back to a direct oral anticoagulant and recheck an echocardiogram in a couple of months.

## 2021-08-24 ENCOUNTER — Other Ambulatory Visit: Payer: Self-pay

## 2021-08-24 ENCOUNTER — Encounter: Payer: Self-pay | Admitting: Internal Medicine

## 2021-08-24 ENCOUNTER — Ambulatory Visit (INDEPENDENT_AMBULATORY_CARE_PROVIDER_SITE_OTHER): Payer: Medicare Other | Admitting: Internal Medicine

## 2021-08-24 VITALS — BP 102/60 | HR 87 | Ht 64.0 in | Wt 141.2 lb

## 2021-08-24 DIAGNOSIS — Z2239 Carrier of other specified bacterial diseases: Secondary | ICD-10-CM | POA: Diagnosis not present

## 2021-08-24 DIAGNOSIS — R918 Other nonspecific abnormal finding of lung field: Secondary | ICD-10-CM | POA: Diagnosis not present

## 2021-08-24 DIAGNOSIS — J449 Chronic obstructive pulmonary disease, unspecified: Secondary | ICD-10-CM

## 2021-08-24 NOTE — Addendum Note (Signed)
Addended by: Konrad Felix L on: 08/24/2021 09:44 AM   Modules accepted: Orders

## 2021-08-24 NOTE — Patient Instructions (Addendum)
    ICD-10-CM   1. Moderate COPD (chronic obstructive pulmonary disease) (HCC)  J44.9     2. Multiple lung nodules on CT  R91.8     3. 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 [name] in 6 months or sooner if needed

## 2021-08-24 NOTE — Progress Notes (Signed)
PCP Ria Bush, MD  HPI    OV 08/02/2017  Chief Complaint  Patient presents with   Follow-up    Pt states that she got bronchitis x1 month ago. Has been on two rounds of abx and two rounds of steroids. States that it is not in her chest anymore but is still having drainage, sore throat, dry cough, occ. SOB. Denies any CP.    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 She comes for routine follow-up. Recently she had COPD flareup was treated by primary care. Then I called in antibiotic and repeat prednisone and this again helped her. Nevertheless she is frustrated by postnasal drainage and the resultant cough. She says normally for COPD exacerbations are associated with initial onset of sinus congestion. Then she's always left with a residual postnasal drip but this time it has lingered longer. She says Sudafed helps her but is no longer available over-the-counter. She really wants relief about this critically. COPD cat score is 14. Is also asking me to sign handicap parking sticker   OV 10/31/2017  Chief Complaint  Patient presents with   Follow-up    In hosp. 11/13-11/15 due to sepsis from UTI. Pt states she is doing good but can't seem to get her energy back. Pt currently has laryngitis and SOB with exertion.    Follow-up Gold stage II/III COPD not on home oxygen but on triple inhaler therapy.  Is a routine follow-up.  Since I last saw her in the middle of November 2018 she got diagnosed with E. coli sepsis and admitted to the hospital.  Since then she has significant amount of fatigue.  The fatigue is not improved much.  She has suspended her maintenance pulmonary rehabilitation but plans to restart it in March 2019.  She has not had any COPD exacerbations.  She is up-to-date with the flu shot.  Overall COPD CAT score is stable but there is an increase in the level of fatigue.  The only new issue that she complains about is some mild  hoarseness in voice going on for several months.  This is present early in the morning when she wakes up and then as the day goes on it is better.  She notices it when she talks a lot of time in the phone after a long period of rest.  It is insidious onset.  It is not progressive.  There are no other associated symptoms.   OV 02/05/2018  Chief Complaint  Patient presents with   Follow-up    Pt states she saw PCP two weeks ago and had pna. Pt states she is still not over the pna and has no energy, has hoarseness in voice, cough, and has chest tightness.     Follow-up gold stage II/III COPD not on home oxygen but on triple inhaler therapy with Singulair  Routine follow-up.  She tells me t of labs from January 22, 2018 hat January 22, 2018 she started feeling chest tightness wheezing and also increase head cold.  She saw her primary care physician Dr. Danise Mina.  Apparently all the blood work was normal except for mild increase in liver enzymes which she says she fluctuates with.  He was initially thinking a sinus congestion but when he did an x-ray of the report came back as a right lower lobe pneumonia [I personally visualized this x-ray and I agree she might not have right lower lobe pneumonia because in 2015 CT  chest she had some chronic scarring at that area].  She was treated with 5-day Levaquin and a Medrol Dosepak but despite this other than subjective fevers are resolving she still has significant cough congestion and chest tightness without any orthopnea proximal nocturnal dyspnea.  COPD CAT score shows significant deterioration.  She is not feeling well.  Of note, she is intentionally losing weight because of weight watchers but she has not increased her fluid intake adequately.  She has quit all diet soda  Show slight increase in liver enzymes and also slight increase in creatinine compared to 6 months ago.   OV 03/13/2018  Chief Complaint  Patient presents with   Follow-up    Breathing is  improved since last OV. Still reports DOE and occasional cough. Denies chest tightness or wheezing.    Chong J  presents for follow-up of her gold stage II /3 of COPD.  On triple inhaler therapy with Spiriva and Brio and also on Singulair   Overall in terms of her COPD: She is stable.  She is compliant with her inhalers.  She is not on oxygen therapy.  Her symptoms actually improved.  In the interim she was diagnosed with atrial fibrillation and she is placed on Xarelto.  She is frustrated because she has to be on 1 more extra medication.  She did have pulmonary function test that is documented below because it has been a while since we did one.  It actually shows improvement in the lung function.  She also had a CT scan of the chest because the last one was in 2015.  This shows scattered lung nodules with the largest one 8 mm in the right lower lobe.  The radiologist has described this is nonspecific in appearance.  She heard the result and she has been extremely anxious and upset.  She is worried that her colon cancer has metastasized or she has lung cancer.  Of note, I personally visualized the CT chest and agree with the findings   OV 06/27/2018  Subjective:  Patient ID: [name], female , DOB: [birth year] , age 75 y.o. , MRN: [id number] , ADDRESS: [address] [place] [address]   06/27/2018 -   Chief Complaint  Patient presents with   Follow-up    Pt states her breathing has been doing good since last visit. States she has been having some BP issues to the point she fainted and cracked some ribs.  Denies any problems with cough or CP.     HPI [name] 75 y.o. - follow-up for  - COPD: Overall this is stable with inhaler therapy. She has not had her flu shot. She wants to wait 2 weeks because she just had first shot of her shingles vaccine. She is up-to-date with her pneumonia vaccine. In the interim she has lost weight 60 pounds intentionally and this is improved  on Score  Multiple  Lung nodules: She had follow-up CT chest in September 2019 and compared to May 2019: One of the nodules is resolved to 16 mm and stable. She does have some rib fractures subacute and this is because she fell down. She's been having balance issues with the weight loss and orthostatic hypotension. Both of this is being managed by neurosurgery and primary care  Travel advice: She plans to have back surgery for her foot drop after this she plans to recover at her son's home near [place]. Therefore she wants a antibiotic and prednisone taper to  be handy with her in case of a future COPD exacerbation.    Results for TORIE, TOWLE (MRN [id number]) as of 03/13/2018 11:58  Ref. Range 11/29/2013 16:04 04/01/2016 10:10 03/13/2018 10:36  FEV1-Pre Latest Units: L 0.93 1.35 1.42  FEV1-%Pred-Pre Latest Units: % 34 55 59   Results for XOIE, KREUSER (MRN [id number]) as of 03/13/2018 11:58  Ref. Range 03/13/2018 10:36  DLCO cor Latest Units: ml/min/mmHg 22.70  DLCO cor % pred Latest Units: % 84     CT chest Sept 4, 2019 Lungs/Pleura: Pleural thickening posteriorly in the right hemithorax is stable. There is no significant pleural effusion. There is stable subpleural scarring posteriorly in the right lower lobe. There is central airway thickening with scattered tree-in-bud nodularity in both lungs, similar to the most recent prior study. Nodular scarring in both upper lobes is stable. The right lower lobe perifissural nodule present on the most recent study has resolved. There is a stable 6 x 5 mm right lower lobe nodule on image 119/3. No new or enlarging pulmonary nodules.    IMPRESSION: 1. Generally stable chronic lung disease with pleuroparenchymal scarring and scattered nodularity. Most of these nodules have a tree-in-bud appearance, likely post infectious or inflammatory. The dominant right lower lobe nodule seen on the most recent study has resolved. No new or enlarging  nodules. 2. No acute chest findings. Healing subacute right-sided rib fractures. 3. Extensive Aortic Atherosclerosis (ICD10-I70.0).     Electronically Signed   By: Richardean Sale [name]   On: 06/13/2018 14    ROS - per HPI  OV 01/22/2020  Subjective:  Patient ID: Meesha Alfonse Flavors, female , DOB: 1946-03-26 , age 75 y.o. , MRN: [id number] , ADDRESS: [address] [place] [address]   01/22/2020 -   Chief Complaint  Patient presents with   Follow-up    Pt states she has been doing good since last visit and denies any complaints.     HPI Joyel Caera Enwright 75 y.o. -presents for follow-up.  Last seen in 2019.  After that because of the pandemic have not seen her.  She saw the nurse practitioner once.  The big interval changes that she has lost a lot of weight.  It is intentional because she is try to keep her self it.  From a functional standpoint COPD symptoms are much improved after weight loss.  She takes Spiriva Singulair and Breo.  However she is having significant back pain.  Other than that she feels fine.  She not able to do pulmonary rehab because of the back pain.  She has had a Covid vaccine  Other issues lung nodules.  Her last CT scan of the chest was in July 2020.  She has waxing and waning.  She is willing to have another CT scan of the chest at the appropriate time interval.      CAT COPD Symptom & Quality of Life Score ([place]) 0 is no burden. 5 is highest burden 08/02/2017  10/31/2017  02/05/2018  06/27/2018 60+ # intentioal weight loss  Never Cough -> Cough all the time _0 No phlegm in chest -> Chest is full of phlegm 0 0 3 0  No chest tightness -> Chest feels very tight 1 0 3 0  No dyspnea for 1 flight stairs/hill -> Very dyspneic for 1 flight of stairs _1 No limitations for ADL at home -> Very limited with ADL at home 1  0 5 3  Confident leaving home -> Not at all confident leaving home 0 0 3 1  Sleep soundly -> Do not sleep  soundly because of lung condition _0 Lots of Energy -> No energy at all _1 TOTAL Score (max 40)  _2 CAT Score 01/22/2020  Total CAT Score 9      ROS - per HPI    OV 07/14/2020  Subjective:  Patient ID: Thurma Alfonse Flavors, female , DOB: [birth year] , age 77 y.o. , MRN: [id number] , ADDRESS: [address] [place] [address]  PCP Ria Bush, MD    07/14/2020 -   Chief Complaint  Patient presents with   Follow-up    no complaints    Advanced COPD on Spiriva and Breo and Singulair Multiple lung nodules with waxing and waning quality -last CT scan July 2020  HPI Christiann Margery Szostak 75 y.o. -presents for follow-up of her COPD.  Last seen in early part of this year.  She is supposed to have a follow-up CT scan of the chest but this has not happened.  Patient tells me that she wants to hold off on getting the CT scan of the chest right now but will do it at the time of follow-up.  Last CT scan of the chest was in July 2020.  At this point in time she continues on Spiriva, Breo and Singulair.  She feels well.  She has been social distancing.  She has had her Covid vaccine but will have a booster next week.  She will have a flu shot today but at the Coumadin clinic.  There are no active complaints.  She is interested in consolidating her Spiriva and Breo and to Trelegy.  She denies any recent exacerbations.   CAT Score 07/14/2020 01/22/2020  Total CAT Score 6 9     No flowsheet data found.   No flowsheet data found.    PFT Results Latest Ref Rng & Units 03/13/2018 04/01/2016 11/29/2013  FVC-Pre L 2.21 2.22 1.49  FVC-Predicted Pre % 70 68 42  FVC-Post L - 2.33 1.60  FVC-Predicted Post % - 72 45  Pre FEV1/FVC % % 64 61 63  Post FEV1/FCV % % - 63 65  FEV1-Pre L 1.42 1.35 0.93  FEV1-Predicted Pre % 59 55 34  FEV1-Post L - 1.47 1.05  DLCO uncorrected ml/min/mmHg 24.55 19.21 -  DLCO UNC% % 91 71 -  DLCO corrected ml/min/mmHg 22.70 - -  DLCO  COR %Predicted % 84 - -  DLVA Predicted % 102 95 -  TLC L - 5.00 -  TLC % Predicted % - 93 -  RV % Predicted % - 121 -      ROS - per HPI   OV 01/19/2021  Subjective:  Patient ID: Nitasha Alfonse Flavors, female , DOB: May 16, 1946 , age 40 y.o. , MRN: [id number] , ADDRESS: [address] [place]  [address] PCP Ria Bush, MD Patient Care Team: Ria Bush, MD as PCP - General (Family Medicine) Croitoru, Dani Gobble, MD as PCP - Cardiology (Cardiology) [name], MD as Consulting Physician (Gastroenterology) Kyung Rudd, MD as Consulting Physician (Radiation Oncology) Odogwu, Genevie Cheshire, MD as Consulting Physician (Hematology and Oncology) Terance Ice, MD (Inactive) as Consulting Physician (Cardiology) Brand Males, MD as Consulting Physician (Pulmonary Disease) Stark Klein, MD (General Surgery) Croitoru, Dani Gobble, MD as Consulting Physician (Cardiology) Shon Hough, MD as Consulting Physician (Ophthalmology)  Martinique, Amy, MD as Consulting Physician (Dermatology)  This Provider for this visit: Treatment Team:  Attending Provider: Lauraine Rinne, NP    01/19/2021 -   Chief Complaint  Patient presents with   Follow-up    No current respiratory concerns. Has questions about 4th COVID booster.     Advanced COPD on Spiriva and Breo and Singulair Multiple lung nodules with waxing and waning quality -last CT scan July 2020   HPI Dela Anaid Haney 75 y.o. - COPD and nodule followup. Feels well. Trelegy works better. No new issues. WEight stable. Was supposed to have followup CT for nodules from July 2020 but did not. Has upcoming neck CT. She is ok if we pair CT chest with this schedule. She wants to know if she should take 4th covid booster. Her bias is that 3 is good enough +_ she is masking and being careful and would rather wait for higher prevalence of disease or vaccine to new strain    CAT Score 07/14/2020 01/22/2020  Total CAT Score 6  9    OV 03/03/2021  Subjective:  Patient ID: Maanvi Alfonse Flavors, female , DOB: [birth year] , age 49 y.o. , MRN: [id number] , ADDRESS: [place] [id number] PCP Ria Bush, MD Patient Care Team: Ria Bush, MD as PCP - General (Family Medicine) Sanda Klein, MD as PCP - Cardiology (Cardiology) [name], MD as Consulting Physician (Gastroenterology) Kyung Rudd, MD as Consulting Physician (Radiation Oncology) Odogwu, Genevie Cheshire, MD as Consulting Physician (Hematology and Oncology) Terance Ice, MD (Inactive) as Consulting Physician (Cardiology) Brand Males, MD as Consulting Physician (Pulmonary Disease) Stark Klein, MD (General Surgery) Sanda Klein, MD as Consulting Physician (Cardiology) Shon Hough, MD as Consulting Physician (Ophthalmology) Martinique, Amy, MD as Consulting Physician (Dermatology)  This Provider for this visit: Treatment Team:  Attending Provider: Brand Males, MD    03/03/2021 -   Chief Complaint  Patient presents with   Follow-up    Pt had CT performed 4/19 and is here today to discuss the results. States breathing is stable.   Advanced COPD on Spiriva and Breo and Singulair Multiple lung nodules with waxing and waning quality -last CT scan July 2020  HPI Dorenda Juanna Pudlo 75 y.o. -returns for follow-up to discuss CT scan results.  She continues on Trelegy and she is feeling well but she is worried about the CT scan results.  I personally visualized the CT report.  It shows increase in nodular densities that are progressive in the last 2 years.  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  No evidence of cancer or pulmonary fibrosis.  Her overall symptom burden is minimal.  She had questions about what Mycobacterium avium complex is.  We went over this.  We discussed bronchoscopy.  She prefers to do it under sedation.  She is very concerned about the fact she is on Coumadin.  She does not want to stop this  because she has a heart valve.  She wants me to talk to her cardiologist about this.  Explained to her that by not doing a biopsy.  There is some risk of bleeding particularly with scope trauma either epistaxis or in the airway but this risk would be low.  We can try to minimize the risk by going to the oral cavity or even intubation.  I have written to cardiologist Dr. Sallyanne Kuster about this.  Did outline general management plan with MAI.  CAT Score 07/14/2020 01/22/2020  Total CAT Score 6  9        CT Chest data 01/26/21   Mediastinum/Nodes: Mediastinal lymph nodes measure up to 12 mm in the low right paratracheal station, unchanged. Hilar regions are difficult to definitively evaluate without IV contrast. No axillary adenopathy. Esophagus is grossly unremarkable.   Lungs/Pleura: Biapical pleuroparenchymal scarring, right greater than left. Ill-defined peribronchovascular nodularity and peribronchovascular nodular consolidation, progressive from 04/22/2019. Largest new individual nodule measures 7 mm in the right middle lobe (5 x 8 mm, 3/97). Associated mild bronchiectasis and scattered mucoid impaction. Pleuroparenchymal scarring in the right middle and right lower lobes. No pleural fluid. Airway is unremarkable. IMPRESSION: 1. 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 2. Largest new individual nodule measures 7 mm in the right middle lobe and is strongly favored to be infectious in etiology. Non-contrast chest CT at 3-6 months is recommended. If the nodules are stable at time of repeat CT, then future CT at 18-24 months (from today's scan) is considered optional for low-risk patients, but is recommended for high-risk patients. This recommendation follows the consensus statement: Guidelines for Management of Incidental Pulmonary Nodules Detected  on CT Images: From the Fleischner Society 2017; Radiology 2017; 284:228-243. 3. Ascending aortic aneurysm, stable. Recommend annual imaging followup by CTA or MRA. This recommendation follows 2010 ACCF/AHA/AATS/ACR/ASA/SCA/SCAI/SIR/STS/SVM Guidelines for the Diagnosis and Management of Patients with Thoracic Aortic Disease. Circulation. 2010; 121: Z767-H419. Aortic aneurysm NOS (ICD10-I71.9). 4.  Aortic atherosclerosis (ICD10-I70.0). 5. Enlarged pulmonic trunk, indicative pulmonary arterial hypertension.     Electronically Signed   By: Lorin Picket [name]   On: 01/27/2021 08:41       No results found.       OV 04/14/2021  Subjective:  Patient ID: Janat Alfonse Flavors, female , DOB: 1946/07/16 , age 89 y.o. , MRN: [id number] , ADDRESS: [place] [address] PCP Ria Bush, MD Patient Care Team: Ria Bush, MD as PCP - General (Family Medicine) Sanda Klein, MD as PCP - Cardiology (Cardiology) [name], MD as Consulting Physician (Gastroenterology) Kyung Rudd, MD as Consulting Physician (Radiation Oncology) Nira Retort, MD as Consulting Physician (Hematology and Oncology) Terance Ice, MD (Inactive) as Consulting Physician (Cardiology) Brand Males, MD as Consulting Physician (Pulmonary Disease) Stark Klein, MD (General Surgery) Sanda Klein, MD as Consulting Physician (Cardiology) Shon Hough, MD as Consulting Physician (Ophthalmology) Martinique, Amy, MD as Consulting Physician (Dermatology)  This Provider for this visit: Treatment Team:  Attending Provider: Brand Males, MD    04/14/2021 -   Chief Complaint  Patient presents with   Follow-up    Pt had bronchoscopy performed 6/15 and is here today to discuss results.  Pt states that she has been doing okay since last visit. States that she has been taking the abx as prescribed based on the results of the prior bronch.     HPI Pebble Tacha Manni 75 y.o. -returns for follow-up of her COPD and multiple lung nodules that were enlarging.  On March 24, 2021 she underwent bronchoscopy with lavage of right middle lobe and left upper lobe.  Both grew Pseudomonas.  We put her on ciprofloxacin.  She returns for follow-up.  She says there is no difference in the ciprofloxacin.  She had questions about what Pseudomonas colonization means.  I also informed her when I did bronchoscopy I found mucus casting in various aspects of the right lung but also in the left upper lobe  entrance.  The right sided ones were easily friable but on touch because significant submucosal bleeding.  Therefore did not touch the left upper lobe.  I showed her a picture of this.  Told her that she might need repeat bronchoscopy in the future.  At this point in time she is stable.  There is no Mycobacterium avium complex.  She is currently on a course of Cipro.    CAT Score 04/14/2021 07/14/2020 01/22/2020  Total CAT Score _0 Results for ELECTRA, PALADINO (MRN [id number]) as of 04/14/2021 13:55  Ref. Range 03/24/2021 14:46  Organism ID, Bacteria RML and LUL Unknown PSEUDOMONAS AERUGINOSA       OV 08/24/2021  Subjective:  Patient ID: Shatonya Alfonse Flavors, female , DOB: [birth year] , age 61 y.o. , MRN: [id number] , ADDRESS: [place] [id number] PCP Ria Bush, MD Patient Care Team: Ria Bush, MD as PCP - General (Family Medicine) Sanda Klein, MD as PCP - Cardiology (Cardiology) [name], MD as Consulting Physician (Gastroenterology) Kyung Rudd, MD as Consulting Physician (Radiation Oncology) Nira Retort, MD as Consulting Physician (Hematology and Oncology) Terance Ice, MD (Inactive) as Consulting Physician (Cardiology) Brand Males, MD as Consulting Physician (Pulmonary Disease) Stark Klein, MD (General Surgery) Sanda Klein, MD as Consulting Physician (Cardiology) Shon Hough, MD  as Consulting Physician (Ophthalmology) Martinique, Amy, MD as Consulting Physician (Dermatology)  This Provider for this visit: Treatment Team:  Attending Provider: Brand Males, MD    08/24/2021 -   Chief Complaint  Patient presents with   Follow-up    Ct scan review     HPI Truc Alfonse Flavors 75 y.o. - returns for followup. Feels stable. Cotninue inhalers. Has has had both prevnar and pneuomovx after age 64. She Is asking if shes hould repeat pneumovax/prevnar. Per Uptodate -> no need for repeat vaccine. CT chest personally visualized: much improved. I showed it to her. Nonew issues    CT Chest data 08/16/21   Narrative & Impression  CLINICAL DATA:  Lung nodule.   EXAM: CT CHEST WITHOUT CONTRAST   TECHNIQUE: Multidetector CT imaging of the chest was performed following the standard protocol without IV contrast.   COMPARISON:  01/26/2021 and 04/22/2019.   FINDINGS: Cardiovascular: Atherosclerotic calcification of the aorta. Ascending aorta measures 4.2 cm, stable. Aortic valve repair. Dense mitral annulus calcification. Heart is enlarged. No pericardial effusion. Pulmonic trunk is enlarged.   Mediastinum/Nodes: Mediastinal lymph nodes are not enlarged by CT size criteria. Hilar regions are difficult to evaluate without IV contrast. Esophagus is grossly unremarkable.   Lungs/Pleura: Pleuroparenchymal scarring in the apical right upper lobe. Mild bronchiectasis, peribronchovascular nodularity, ground-glass and scattered mucoid impaction in the mid and lower lung zones, similar to 01/26/2021 but with interval resolution of some areas of associated consolidation. Subpleural volume loss in the dependent portion of the right lower lobe. No pleural fluid. Airway is unremarkable.   Upper Abdomen: Visualized portions of the liver, adrenal glands, kidneys, spleen pancreas, stomach and bowel are grossly unremarkable. Cholecystectomy.   Musculoskeletal: Degenerative  changes in the spine. Old right rib fractures. Unchanged L1 compression fracture. No worrisome lytic or sclerotic lesions.   IMPRESSION: 1. Pulmonary parenchymal pattern of bronchiectasis, peribronchovascular nodularity, scattered mucoid impaction and ground-glass, most indicative of mycobacterium avium complex. Previously seen areas of associated consolidation have largely resolved in the interval. 2. Ascending aortic aneurysm. Recommend annual imaging followup by CTA or MRA. This recommendation follows 2010 ACCF/AHA/AATS/ACR/ASA/SCA/SCAI/SIR/STS/SVM Guidelines for  the Diagnosis and Management of Patients with Thoracic Aortic Disease. Circulation. 2010; 121: E993-Z169. Aortic aneurysm NOS (ICD10-I71.9). I 3.  Aortic atherosclerosis (ICD10-I70.0). 4. Enlarged pulmonic trunk, indicative of pulmonary arterial hypertension.     Electronically Signed   By: Lorin Picket [name]   On: 08/16/2021 14:18    No results found.    PFT  PFT Results Latest Ref Rng & Units 03/13/2018 04/01/2016 11/29/2013  FVC-Pre L 2.21 2.22 1.49  FVC-Predicted Pre % 70 68 42  FVC-Post L - 2.33 1.60  FVC-Predicted Post % - 72 45  Pre FEV1/FVC % % 64 61 63  Post FEV1/FCV % % - 63 65  FEV1-Pre L 1.42 1.35 0.93  FEV1-Predicted Pre % 59 55 34  FEV1-Post L - 1.47 1.05  DLCO uncorrected ml/min/mmHg 24.55 19.21 -  DLCO UNC% % 91 71 -  DLCO corrected ml/min/mmHg 22.70 - -  DLCO COR %Predicted % 84 - -  DLVA Predicted % 102 95 -  TLC L - 5.00 -  TLC % Predicted % - 93 -  RV % Predicted % - 121 -       has a past medical history of Acute cystitis without hematuria (12/22/2015), Anemia, Arthritis, CAP (community acquired pneumonia) (02/08/2016), Cataracts, bilateral, Chronic cholecystitis with calculus (01/21/2015), Chronic diastolic heart failure (HCC) (09/19/2014), Chronic insomnia, Constipation, COPD (chronic obstructive pulmonary disease) ([place]), DDD (degenerative disc disease), Dyspnea, Essential  hypertension, GERD (gastroesophageal reflux disease), History of pneumonia (02/16/2016), History of radiation therapy (05/30/11 to 07/07/11), History of shingles, Hyperlipemia, Legally blind in left eye, as defined in [place], Lung nodule, Obesity, Osteopenia (12/2014), Pancreatitis (11/2014), PONV (postoperative nausea and vomiting), Presence of permanent cardiac pacemaker, Rectal cancer ([place]) (04/2011), Research study patient (08/26/2016), Rheumatic heart disease mitral stenosis, S/P AVR (aortic valve replacement) (2015), S/P MVR (mitral valve replacement) (2015), Seizures ([place]), Sepsis secondary to UTI ([place]) (08/22/2017), Severe aortic valve stenosis  AVR 3/15 (10/01/2013), Sleep apnea, Small bowel obstruction, partial ([place]) (08/2013), and Vitamin B12 deficiency (02/28/2015).   reports that she quit smoking about 46 years ago. Her smoking use included cigarettes. She has a 15.00 pack-year smoking history. She has never used smokeless tobacco.  Past Surgical History:  Procedure Laterality Date   ANTERIOR LAT LUMBAR FUSION Left 07/17/2018   Procedure: Lumbar Two-Three Lumbar Three-Four Anterolateral decompression/interbody fusion with lateral plate fixation/Infuse;  Surgeon: Kristeen Miss, MD;  Location: [place];  Service: Neurosurgery;  Laterality: Left;  Lumbar Two-Three Lumbar Three-Four Anterolateral decompression/interbody fusion with lateral plate fixation/Infuse   AORTIC VALVE REPLACEMENT N/A 12/12/2013   Procedure: AORTIC VALVE REPLACEMENT (AVR);  Surgeon: Gaye Pollack, MD; Service: Open Heart Surgery   BACK SURGERY  2006, 2007   2006 SPACER, 2007 decompression and fusion L4/5   BOWEL RESECTION  04/02/2012   Procedure: SMALL BOWEL RESECTION;  Surgeon: Stark Klein, MD;  Location: [facility];  Service: General;  Laterality: N/A;   BRONCHIAL WASHINGS  03/24/2021   Procedure: BRONCHIAL WASHINGS;  Surgeon: Brand Males, MD;  Location: [facility];  Service: Endoscopy;;   CARPAL TUNNEL RELEASE  Bilateral    CATARACT EXTRACTION W/ INTRAOCULAR LENS IMPLANT Left 10/18/2016   CATARACT EXTRACTION W/ INTRAOCULAR LENS IMPLANT Right 12/13/2016   Dr. Kathrin Penner   CHOLECYSTECTOMY N/A 01/21/2015   chronic cholecystitis, Stark Klein, MD   COLON RESECTION  08/25/2011   Procedure: COLON RESECTION LAPAROSCOPIC;  Surgeon: Stark Klein, MD;  Location: [facility];  Service: General;  Laterality: N/A;  Laparoscopic Assisted Low Anterior Resection Diverting Ostomy and  onQ pain pump   COLONOSCOPY  03/2013   1 polyp, rpt 3 yrs Ardis Hughs)   COLONOSCOPY  05/2021   diverticulosis and patent colon anastomosis with some edema/narrowing, rpt 5 yrs Ardis Hughs)   COLONOSCOPY WITH PROPOFOL N/A 06/09/2016   patent colo-colonic anastomosis, rpt 5 yrs Ardis Hughs)   EP IMPLANTABLE DEVICE N/A 06/16/2016   Procedure: Pacemaker Implant;  Surgeon: Will Meredith Leeds, MD;  Location: [facility];  Service: Cardiovascular;  Laterality: N/A;   FOOT SURGERY  left foot   hammer toe   ILEOSTOMY  08/25/2011   ILEOSTOMY CLOSURE  04/02/2012   Procedure: ILEOSTOMY TAKEDOWN;  Surgeon: Stark Klein, MD;  Location: [facility];  Service: General;  Laterality: N/A;   INTRAOPERATIVE TRANSESOPHAGEAL ECHOCARDIOGRAM N/A 12/12/2013   Procedure: INTRAOPERATIVE TRANSESOPHAGEAL ECHOCARDIOGRAM;  Surgeon: Gaye Pollack, MD;  Location: [facility];  Service: Open Heart Surgery;  Laterality: N/A;   LEFT AND RIGHT HEART CATHETERIZATION WITH CORONARY ANGIOGRAM N/A 11/27/2013   Procedure: LEFT AND RIGHT HEART CATHETERIZATION WITH CORONARY ANGIOGRAM;  Surgeon: Sanda Klein, MD;  Location: [facility];  Service: Cardiovascular;  Laterality: N/A;   MITRAL VALVE REPLACEMENT N/A 12/12/2013   Procedure: MITRAL VALVE (MV) REPLACEMENT OR REPAIR;  Surgeon: Gaye Pollack, MD; Service: Open Heart Surgery   TEE WITHOUT CARDIOVERSION N/A 11/29/2013   Procedure: TRANSESOPHAGEAL ECHOCARDIOGRAM (TEE);  Surgeon: Sueanne Margarita, MD;  Location: [facility];  Service:  Cardiovascular;  Laterality: N/A;   TEE WITHOUT CARDIOVERSION N/A 06/04/2019   Procedure: TRANSESOPHAGEAL ECHOCARDIOGRAM (TEE);  Surgeon: Sanda Klein, MD;  Location: [facility];  Service: Cardiovascular;  Laterality: N/A;   TONSILLECTOMY     VIDEO BRONCHOSCOPY N/A 03/24/2021   Procedure: VIDEO BRONCHOSCOPY WITHOUT FLUORO;  Surgeon: Brand Males, MD;  [facility];  Service: Endoscopy;  Laterality: N/A;  SMALL SCOPE,PREMEDICATE WITH NEBULIZED LIDOCAINE    Allergies  Allergen Reactions   Codeine Nausea Only   Sulfa Antibiotics Nausea Only   Sulfonamide Derivatives Nausea Only    Immunization History  Administered Date(s) Administered   Fluad Quad(high Dose 65+) 07/11/2019, 07/14/2020   H1N1 09/17/2008   Influenza Split 07/18/2012   Influenza Whole 08/10/2005, 06/24/2009, 07/11/2011   Influenza, High Dose Seasonal PF 07/04/2016, 07/04/2017, 07/27/2021   Influenza,inj,Quad PF,6+ Mos 07/09/2018   Influenza-Unspecified 07/24/2013, 06/30/2014, 06/30/2015   Moderna SARS-COV2 Booster Vaccination 08/24/2020   Moderna Sars-Covid-2 Vaccination 02/26/2021   PFIZER(Purple Top)SARS-COV-2 Vaccination 10/30/2019, 11/20/2019   Pneumococcal Conjugate-13 06/02/2014   Pneumococcal Polysaccharide-23 08/10/2005, 04/21/2008, 06/25/2013   Td 10/11/2005   Zoster Recombinat (Shingrix) 06/25/2018, 08/28/2018   Zoster, Live 09/16/2014    Family History  Problem Relation Age of Onset   Heart disease Mother    Diabetes Mother    Hypertension Mother    Stroke Mother    Lung cancer Father    Breast cancer Maternal Aunt 83   Heart attack Maternal Grandfather    CAD Son 29   Heart attack Son 48   Rectal cancer Neg Hx    Stomach cancer Neg Hx    Esophageal cancer Neg Hx    Colon cancer Neg Hx      Current Outpatient Medications:    acetaminophen (TYLENOL) 500 MG tablet, Take 2 tablets (1,000 mg total) by mouth daily., Disp: , Rfl:    albuterol (VENTOLIN HFA) 108 (90 Base)  MCG/ACT inhaler, Inhale 2 puffs into the lungs every 6 (six) hours as needed for wheezing., Disp: 18 g, Rfl: 6   amoxicillin (AMOXIL) 500 MG capsule, TAKE  4 CAPSULES BY MOUTH 30-60 MINUTES PRIOR TO APPOINTMENT, Disp: 4 capsule, Rfl: 5   apixaban (ELIQUIS) 5 MG TABS tablet, Take 1 tablet (5 mg total) by mouth 2 (two) times daily., Disp: 60 tablet, Rfl: 11   Biotin 1000 MCG tablet, Take 1,000 mcg by mouth daily., Disp: , Rfl:    calcium carbonate (OSCAL) 1500 (600 Ca) MG TABS tablet, Take 600 mg of elemental calcium by mouth daily., Disp: , Rfl:    carboxymethylcellulose (REFRESH PLUS) 0.5 % SOLN, Place 1 drop into both eyes 3 (three) times daily as needed (dry eyes)., Disp: , Rfl:    cephALEXin (KEFLEX) 500 MG capsule, Take 1 capsule (500 mg total) by mouth 3 (three) times daily., Disp: 15 capsule, Rfl: 0   Cholecalciferol (VITAMIN D) 50 MCG (2000 UT) tablet, Take 2,000 Units by mouth daily., Disp: , Rfl:    docusate sodium (COLACE) 100 MG capsule, Take 200 mg by mouth every evening., Disp: , Rfl:    furosemide (LASIX) 40 MG tablet, Take 1 tablet (40 mg total) by mouth daily. May take an extra tablet daily as needed for swelling., Disp: 45 tablet, Rfl: 11   gabapentin (NEURONTIN) 300 MG capsule, Take 300 mg by mouth at bedtime., Disp: , Rfl:    GEMTESA 75 MG TABS, Take 75 mg by mouth daily., Disp: , Rfl:    ipratropium-albuterol (DUONEB) 0.5-2.5 (3) MG/3ML SOLN, Take 3 mLs by nebulization every 6 (six) hours as needed., Disp: 360 mL, Rfl: 0   lovastatin (MEVACOR) 40 MG tablet, TAKE 1 TABLET(40 MG) BY MOUTH AT BEDTIME, Disp: 90 tablet, Rfl: 1   montelukast (SINGULAIR) 10 MG tablet, TAKE 1 TABLET(10 MG) BY MOUTH AT BEDTIME, Disp: 30 tablet, Rfl: 11   Multiple Vitamins-Minerals (PRESERVISION AREDS 2 PO), Take 2 capsules by mouth every evening. , Disp: , Rfl:    polyethylene glycol (MIRALAX / GLYCOLAX) packet, Take 17 g by mouth daily., Disp: 14 each, Rfl: 0   potassium chloride (KLOR-CON) 10 MEQ tablet,  TAKE 2 TABLETS(20 MEQ) BY MOUTH DAILY, Disp: 60 tablet, Rfl: 9   silver sulfADIAZINE (SILVADENE) 1 % cream, Apply 1 application topically daily., Disp: 50 g, Rfl: 1   traMADol (ULTRAM) 50 MG tablet, TAKE 1 TABLET(50 MG) BY MOUTH TWICE DAILY AS NEEDED FOR MODERATE PAIN, Disp: 30 tablet, Rfl: 0   traZODone (DESYREL) 50 MG tablet, Take 0.5-1 tablets (25-50 mg total) by mouth at bedtime., Disp: 30 tablet, Rfl: 3   TRELEGY ELLIPTA 200-62.5-25 MCG/ACT AEPB, INHALE 1 PUFF INTO THE LUNGS DAILY, Disp: 60 each, Rfl: 5   vitamin B-12 (CYANOCOBALAMIN) 500 MCG tablet, Take 500 mcg by mouth every other day. , Disp: , Rfl:    Zinc 50 MG CAPS, Take 1 capsule (50 mg total) by mouth daily., Disp: , Rfl: 0 No current facility-administered medications for this visit.  Facility-Administered Medications Ordered in Other Visits:    ondansetron (ZOFRAN) 4 mg in sodium chloride 0.9 % 50 mL IVPB, 4 mg, Intravenous, Q6H PRN, Kristeen Miss, MD      Objective:   Vitals:   08/24/21 0901  BP: 102/60  Pulse: 87  SpO2: 95%  Weight: 141 lb 3.2 oz (64 kg)  Height: _0  (1.626 m)    Estimated body mass index is 24.24 kg/m as calculated from the following:   Height as of this encounter: _1  (1.626 m).   Weight as of this encounter: 141 lb 3.2 oz (64 kg).  _2 @  [employer]  08/24/21 0901  Weight: 141 lb 3.2 oz (64 kg)     Physical Exam General: No distress. Looks thin.  Neuro: Alert and Oriented x 3. GCS 15. Speech normal Psych: Pleasant Resp:  Barrel Chest - no.  Wheeze - no, Crackles - no, No overt respiratory distress CVS: Normal heart sounds. Murmurs - no Ext: Stigmata of Connective Tissue Disease - no. Khyhosis + HEENT: Normal upper airway. PEERL +. No post nasal drip        Assessment:       ICD-10-CM   1. Moderate COPD (chronic obstructive pulmonary disease) (HCC)  J44.9     2. Multiple lung nodules on CT  R91.8     3. 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 Moderate COPD (chronic obstructive pulmonary disease) (HCC)  J44.9     2. Multiple lung nodules on CT  R91.8     3. 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 [name] in 6 months or sooner if needed    SIGNATURE    Dr. Brand Males, [name], F.C.C.P,  Pulmonary and Critical Care Medicine Staff Physician, [facility] Director - Interstitial Lung Disease  Program  [facility] [place], [place], [address]  Pager: [phone], If no answer or between  15:00h - 7:00h: call [phone] Telephone: [phone]  9:36 AM 08/24/2021

## 2021-08-27 ENCOUNTER — Ambulatory Visit: Payer: Medicare Other

## 2021-09-03 ENCOUNTER — Other Ambulatory Visit: Payer: Self-pay | Admitting: Internal Medicine

## 2021-09-09 ENCOUNTER — Ambulatory Visit: Payer: Medicare Other | Admitting: Cardiovascular Disease

## 2021-09-16 ENCOUNTER — Other Ambulatory Visit: Payer: Self-pay

## 2021-09-16 NOTE — Telephone Encounter (Signed)
Name of Medication: Tramadol Name of Pharmacy: [facility] or Written Date and Quantity: 08/23/21, #30 Last Office Visit and Type: 08/10/21, skin tear Next Office Visit and Type: none Last Controlled Substance Agreement Date: none Last UDS: none

## 2021-09-20 MED ORDER — TRAMADOL HCL 50 MG PO TABS: ORAL_TABLET | ORAL | 0 refills | Status: DC

## 2021-09-20 NOTE — Telephone Encounter (Signed)
ERx 

## 2021-10-13 ENCOUNTER — Other Ambulatory Visit: Payer: Self-pay

## 2021-10-13 ENCOUNTER — Encounter: Payer: Self-pay | Admitting: Family Medicine

## 2021-10-13 ENCOUNTER — Ambulatory Visit (HOSPITAL_COMMUNITY): Payer: Medicare Other | Attending: Cardiology

## 2021-10-13 DIAGNOSIS — Z953 Presence of xenogenic heart valve: Secondary | ICD-10-CM | POA: Diagnosis not present

## 2021-10-13 LAB — ECHOCARDIOGRAM COMPLETE
AV Mean grad: 12.6 mmHg
AV Peak grad: 20.9 mmHg
Ao pk vel: 2.28 m/s
Area-P 1/2: 2.24 cm2
S' Lateral: 2.5 cm

## 2021-10-14 ENCOUNTER — Encounter: Payer: Self-pay | Admitting: Family Medicine

## 2021-10-14 MED ORDER — TRAZODONE HCL 50 MG PO TABS: 25.0000 mg | ORAL_TABLET | Freq: Every day | ORAL | 6 refills | Status: DC

## 2021-10-14 MED ORDER — TRAMADOL HCL 50 MG PO TABS: ORAL_TABLET | ORAL | 0 refills | Status: DC

## 2021-10-15 ENCOUNTER — Ambulatory Visit: Payer: Self-pay

## 2021-10-15 NOTE — Telephone Encounter (Signed)
I refilled this last night.

## 2021-10-15 NOTE — Telephone Encounter (Signed)
Contacted pt to check up on her since she has started eliquis. Pt reports she is doing well and had an echo recently and will find the results of that on Mon when she has an apt with cardiology. Pt reports reduced bruising and bleeding, although she does still have some.  Advised pt the medications have been sent in and the pharmacy confirmed receipt. Advised if anything further is needed to contact the office. Pt appreciative of the call and verbalized understanding.

## 2021-10-18 ENCOUNTER — Other Ambulatory Visit: Payer: Self-pay

## 2021-10-18 ENCOUNTER — Ambulatory Visit (INDEPENDENT_AMBULATORY_CARE_PROVIDER_SITE_OTHER): Payer: Medicare Other | Admitting: Cardiovascular Disease

## 2021-10-18 ENCOUNTER — Encounter: Payer: Self-pay | Admitting: Cardiovascular Disease

## 2021-10-18 VITALS — BP 132/84 | HR 105 | Ht 64.0 in | Wt 142.2 lb

## 2021-10-18 DIAGNOSIS — Z953 Presence of xenogenic heart valve: Secondary | ICD-10-CM

## 2021-10-18 DIAGNOSIS — Z950 Presence of cardiac pacemaker: Secondary | ICD-10-CM | POA: Diagnosis not present

## 2021-10-18 DIAGNOSIS — T82857D Stenosis of cardiac prosthetic devices, implants and grafts, subsequent encounter: Secondary | ICD-10-CM | POA: Diagnosis not present

## 2021-10-18 DIAGNOSIS — I442 Atrioventricular block, complete: Secondary | ICD-10-CM | POA: Diagnosis not present

## 2021-10-18 DIAGNOSIS — I4819 Other persistent atrial fibrillation: Secondary | ICD-10-CM | POA: Diagnosis not present

## 2021-10-18 DIAGNOSIS — I5032 Chronic diastolic (congestive) heart failure: Secondary | ICD-10-CM

## 2021-10-18 DIAGNOSIS — Z7901 Long term (current) use of anticoagulants: Secondary | ICD-10-CM

## 2021-10-18 NOTE — Patient Instructions (Signed)
Medication Instructions:  No changes *If you need a refill on your cardiac medications before your next appointment, please call your pharmacy*   Lab Work: None ordered If you have labs (blood work) drawn today and your tests are completely normal, you will receive your results only by: MyChart Message (if you have MyChart) OR A paper copy in the mail If you have any lab test that is abnormal or we need to change your treatment, we will call you to review the results.   Testing/Procedures: None ordered   Follow-Up: At [facility], you and your health needs are our priority.  As part of our continuing mission to provide you with exceptional heart care, we have created designated Provider Care Teams.  These Care Teams include your primary Cardiologist (physician) and Advanced Practice Providers (APPs -  Physician Assistants and Nurse Practitioners) who all work together to provide you with the care you need, when you need it.  We recommend signing up for the patient portal called "MyChart".  Sign up information is provided on this After Visit Summary.  MyChart is used to connect with patients for Virtual Visits (Telemedicine).  Patients are able to view lab/test results, encounter notes, upcoming appointments, etc.  Non-urgent messages can be sent to your provider as well.   To learn more about what you can do with MyChart, go to [email or web address].    Your next appointment:   6 month(s)  The format for your next appointment:   In Person  Provider:   Mihai Croitoru, MD {   

## 2021-10-18 NOTE — Progress Notes (Signed)
Cardiology office note   Date:  10/18/2021   ID:  [name], [place] 07-13-1946, MRN [id number]  Patient Location: Home Provider Location: Home  PCP:  Diane Bush, MD  Cardiologist:  Diane Klein, MD  Electrophysiologist:  None   Evaluation Performed:  Follow-Up Visit  Chief Complaint: CHF, AFib  History of Present Illness:    [name] is a 76 y.o. 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, [place], 2017), (typical?) atrial flutter (overdrive pacing March 2094).  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  She had a lot of problems with easy bleeding and bruising on warfarin, as well as highly erratic INR, rarely kept in normal range.  In February 2022 we switched from warfarin to Eliquis  She has had repeated episodes of transient partial visual field cut in her left eye, lasting for a few minutes at a time.  Usually its the bottom half of her visual field that she loses.  Initial concern that this may be related to thromboembolic events is felt to likely anymore, since the episodes are repetitive and stereotypical always in the same distribution.  Her previous ophthalmological evaluation also "did not show anything wrong".  She had a repeat echocardiogram a few days ago.  This continues to show normal left ventricular systolic function and stable aortic valve prosthesis parameters.  The mean mitral gradient is slightly higher at 9.7 mmHg (up from 7 mmHg), but the mitral valve deceleration time is actually little lower at 338 ms (previously 392 ms).  This suggests that the increased gradients may be due to a change in cardiac output/stroke-volume, rather than a true deterioration  in mitral valve performance.  He has an appointment in a few weeks with [name] (retina specialist).  She is going to see [name] at [facility] in a few weeks to get another opinion regarding her memory problem.  She has had 1 episode of epistaxis going on intermittently for 24 hours.  No other serious bleeding issues.  She has intermittent swelling in the right ankle especially towards the end of the day.  She takes Lasix no more than once a week for this.  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  The patient specifically denies any chest pain at rest or with exertion, dyspnea at rest or with exertion, orthopnea, paroxysmal nocturnal dyspnea, syncope, palpitations, new focal neurological deficits, intermittent claudication, unexplained weight gain, cough, hemoptysis or wheezing.  Interrogation of her pacemaker continues to show a much lower burden of atrial fibrillation.  She had virtually 100% A. fib for a whole year in 2021, the burden now is down to less than 30% for the last 2-1/2 years.  I would estimate < 10% in the last 6 months (counters were reset today).  She has not had meaningful atrial fibrillation since early November.  She has a distinct positive R wave in lead V1 on her ECG.  Her pacemaker is a [employer] implanted in 2017, both leads are [employer] 2088, so her system is MRI conditional.    She has a very mild ascending aortic aneurysm that has been stable in size, most recently 4.2 cm on 08/16/2021.  She does have scattered aortic atherosclerosis.  On this  last scan there appeared to be improvement in the areas of bronchiectasis/groundglass opacities represent MAC infection.  She had a CT myelogram on May 08 2019.  There was severe left foraminal stenosis at L3-4 as well as moderate left foraminal stenosis at C4-5-6.  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   Past Medical History:  Diagnosis Date   Acute cystitis without hematuria 12/22/2015   Anemia     Arthritis    CAP (community acquired pneumonia) 02/08/2016   Cataracts, bilateral    immature   Chronic cholecystitis with calculus 01/21/2015   S/p cholecystectomy    Chronic diastolic heart failure ([place]) 09/19/2014   Chronic insomnia    Constipation    takes Miralax daily as needed   COPD (chronic obstructive pulmonary disease) (HCC)    Albuterol inhaler daily as needed;Duoneb daily as needed;Spiriva daily   DDD (degenerative disc disease)    cervical - kyphosis with mod DD changes C5/6 and C6/7; lumbar - early DD at L2/3 ([place])   Dyspnea    with exertion   Essential hypertension    was on meds but after appointment Dec 11 with DianeC he took her off   GERD (gastroesophageal reflux disease)    takes Protonix daily   History of pneumonia 02/16/2016   History of radiation therapy 05/30/11 to 07/07/11   rectum   History of shingles    Hyperlipemia    takes Lovastatin daily   Legally blind in left eye, as defined in [place]    Lung nodule    RLL nodule-85m stable 2006, April 2009, and June 2009   Obesity    Osteopenia 12/2014   T -1.5 hip   Pancreatitis 11/2014   ?zpack related vs gallstone pancreatitis with abnormal HIDA scan pending cholecystectomy   PONV (postoperative nausea and vomiting)    Presence of permanent cardiac pacemaker    Rectal cancer ([place] 04/[place]   T3N0; s/p lap LAR, s/p ileostomy, s/p reversal, s/p chemo   Research study patient 08/26/2016   Rheumatic heart disease mitral stenosis    mod MS by echo 02/2014   S/P AVR (aortic valve replacement) 2015   bioprosthetic ([place])   S/P MVR (mitral valve replacement) 2015   bioprosthetic ([place])   Seizures (HCC)    febrile seizures as a child   Sepsis secondary to UTI ([place] 08/22/2017   Severe aortic valve stenosis  AVR 3/15 10/01/2013   mild-mod by echo 02/2014   Sleep apnea    PT TOLD BORDERLINE-TRIED CPAP-DID NOT HELP-SHE DOES NOT USE CPAP   Small bowel obstruction, partial ([place] 08/2013   reolved without  NGT placement.    Vitamin B12 deficiency 02/28/2015   Start B12 shots 02/2015    Past Surgical History:  Procedure Laterality Date   ANTERIOR LAT LUMBAR FUSION Left 07/17/2018   Procedure: Lumbar Two-Three Lumbar Three-Four Anterolateral decompression/interbody fusion with lateral plate fixation/Infuse;  Surgeon: EKristeen Miss MD;  [place]  Service: Neurosurgery;  Laterality: Left;  Lumbar Two-Three Lumbar Three-Four Anterolateral decompression/interbody fusion with lateral plate fixation/Infuse   AORTIC VALVE REPLACEMENT N/A 12/12/2013   Procedure: AORTIC VALVE REPLACEMENT (AVR);  Surgeon: BGaye Pollack MD; Service: Open Heart Surgery   BACK SURGERY  2006, 2007   2006 SPACER, 2007 decompression and fusion L4/5   BOWEL RESECTION  04/02/2012   Procedure: SMALL BOWEL RESECTION;  Surgeon: FStark Klein MD;  [facility];  Service: General;  Laterality: N/A;   BRONCHIAL WASHINGS  03/24/2021  Procedure: BRONCHIAL WASHINGS;  Surgeon: Brand Males, MD;  [facility];  Service: Endoscopy;;   CARPAL TUNNEL RELEASE Bilateral    CATARACT EXTRACTION W/ INTRAOCULAR LENS IMPLANT Left 10/18/2016   CATARACT EXTRACTION W/ INTRAOCULAR LENS IMPLANT Right 12/13/2016   Dr. Kathrin Penner   CHOLECYSTECTOMY N/A 01/21/2015   chronic cholecystitis, Stark Klein, MD   COLON RESECTION  08/25/2011   Procedure: COLON RESECTION LAPAROSCOPIC;  Surgeon: Stark Klein, MD;  [facility];  Service: General;  Laterality: N/A;  Laparoscopic Assisted Low Anterior Resection Diverting Ostomy and onQ pain pump   COLONOSCOPY  03/2013   1 polyp, rpt 3 yrs Ardis Hughs)   COLONOSCOPY  05/2021   diverticulosis and patent colon anastomosis with some edema/narrowing, rpt 5 yrs Ardis Hughs)   COLONOSCOPY WITH PROPOFOL N/A 06/09/2016   patent colo-colonic anastomosis, rpt 5 yrs Ardis Hughs)   EP IMPLANTABLE DEVICE N/A 06/16/2016   Procedure: Pacemaker Implant;  Surgeon: Will Meredith Leeds, MD;  Location: [place] [facility];  Service: Cardiovascular;  Laterality: N/A;   FOOT SURGERY  left foot   hammer toe   ILEOSTOMY  08/25/2011   ILEOSTOMY CLOSURE  04/02/2012   Procedure: ILEOSTOMY TAKEDOWN;  Surgeon: Stark Klein, MD;  [facility];  Service: General;  Laterality: N/A;   INTRAOPERATIVE TRANSESOPHAGEAL ECHOCARDIOGRAM N/A 12/12/2013   Procedure: INTRAOPERATIVE TRANSESOPHAGEAL ECHOCARDIOGRAM;  Surgeon: Gaye Pollack, MD;  Location: [place] [facility];  Service: Open Heart Surgery;  Laterality: N/A;   LEFT AND RIGHT HEART CATHETERIZATION WITH CORONARY ANGIOGRAM N/A 11/27/2013   Procedure: LEFT AND RIGHT HEART CATHETERIZATION WITH CORONARY ANGIOGRAM;  Surgeon: Diane Klein, MD;  Location: [place] [facility];  Service: Cardiovascular;  Laterality: N/A;   MITRAL VALVE REPLACEMENT N/A 12/12/2013   Procedure: MITRAL VALVE (MV) REPLACEMENT OR REPAIR;  Surgeon: Gaye Pollack, MD; Service: Open Heart Surgery   TEE WITHOUT CARDIOVERSION N/A 11/29/2013   Procedure: TRANSESOPHAGEAL ECHOCARDIOGRAM (TEE);  Surgeon: Sueanne Margarita, MD;  Location[facility];  Service: Cardiovascular;  Laterality: N/A;   TEE WITHOUT CARDIOVERSION N/A 06/04/2019   Procedure: TRANSESOPHAGEAL ECHOCARDIOGRAM (TEE);  Surgeon: Diane Klein, MD;  Location: [place];  Service: Cardiovascular;  Laterality: N/A;   TONSILLECTOMY     VIDEO BRONCHOSCOPY N/A 03/24/2021   Procedure: VIDEO BRONCHOSCOPY WITHOUT FLUORO;  Surgeon: Brand Males, MD;  [facility];  Service: Endoscopy;  Laterality: N/A;  SMALL SCOPE,PREMEDICATE WITH NEBULIZED LIDOCAINE     Current Meds  Medication Sig   acetaminophen (TYLENOL) 500 MG tablet Take 2 tablets (1,000 mg total) by mouth daily.   apixaban (ELIQUIS) 5 MG TABS tablet Take 1 tablet (5 mg total) by mouth 2 (two) times daily.   Biotin 1000 MCG tablet Take 1,000 mcg by mouth daily.   calcium carbonate (OSCAL) 1500 (600 Ca) MG TABS tablet Take 600 mg of elemental calcium by mouth daily.    Cholecalciferol (VITAMIN D) 50 MCG (2000 UT) tablet Take 2,000 Units by mouth daily.   Fluticasone-Umeclidin-Vilant (TRELEGY ELLIPTA) 200-62.5-25 MCG/ACT AEPB INHALE 1 PUFF INTO THE LUNGS DAILY   furosemide (LASIX) 40 MG tablet Take 1 tablet (40 mg total) by mouth daily. May take an extra tablet daily as needed for swelling.   gabapentin (NEURONTIN) 300 MG capsule Take 300 mg by mouth at bedtime.   GEMTESA 75 MG TABS Take 75 mg by mouth daily.   lovastatin (MEVACOR) 40 MG tablet TAKE 1 TABLET(40 MG) BY MOUTH AT BEDTIME   montelukast (SINGULAIR) 10 MG tablet TAKE 1 TABLET(10 MG) BY MOUTH AT  BEDTIME   Multiple Vitamins-Minerals (PRESERVISION AREDS 2 PO) Take 2 capsules by mouth every evening.    potassium chloride (KLOR-CON) 10 MEQ tablet TAKE 2 TABLETS(20 MEQ) BY MOUTH DAILY   traMADol (ULTRAM) 50 MG tablet TAKE 1 TABLET(50 MG) BY MOUTH TWICE DAILY AS NEEDED FOR MODERATE PAIN   traZODone (DESYREL) 50 MG tablet Take 0.5-1 tablets (25-50 mg total) by mouth at bedtime.   vitamin B-12 (CYANOCOBALAMIN) 500 MCG tablet Take 500 mcg by mouth every other day.    Zinc 50 MG CAPS Take 1 capsule (50 mg total) by mouth daily.     Allergies:   Codeine, Sulfa antibiotics, and Sulfonamide derivatives   Social History   Tobacco Use   Smoking status: Former    Packs/day: 1.50    Years: 10.00    Pack years: 15.00    Types: Cigarettes    Quit date: 12/28/1974    Years since quitting: 46.8   Smokeless tobacco: Never   Tobacco comments:    quit smoking in 1976  Vaping Use   Vaping Use: Never used  Substance Use Topics   Alcohol use: Not Currently    Alcohol/week: 2.0 standard drinks    Types: 2 Glasses of wine per week    Comment: very rare   Drug use: No     Family Hx: The patient's family history includes Breast cancer (age of onset: 48) in her maternal aunt; CAD (age of onset: 47) in her son; Diabetes in her mother; Heart attack in her maternal grandfather; Heart attack (age of onset: 72) in her  son; Heart disease in her mother; Hypertension in her mother; Lung cancer in her father; Stroke in her mother. There is no history of Rectal cancer, Stomach cancer, Esophageal cancer, or Colon cancer.  ROS:   Please see the history of present illness.     All other systems reviewed and are negative.   Prior CV studies:   The following studies were reviewed today:  Echocardiogram 03/26/2021  1. S/P AVR with mean gradient of 14 mmHg and trace AI; S/P MVR with mean  gradient of 7 mmHg.   2. Left ventricular ejection fraction, by estimation, is 60 to 65%. The  left ventricle has normal function. The left ventricle has no regional  wall motion abnormalities. There is mild left ventricular hypertrophy.  Left ventricular diastolic parameters  are indeterminate. Elevated left atrial pressure.   3. Right ventricular systolic function is normal. The right ventricular  size is mildly enlarged. There is mildly elevated pulmonary artery  systolic pressure.   4. Left atrial size was severely dilated.   5. The mitral valve has been repaired/replaced. No evidence of mitral  valve regurgitation. Mild mitral stenosis. There is a bioprosthetic valve  present in the mitral position.   6. The aortic valve has been repaired/replaced. Aortic valve  regurgitation is trivial. No aortic stenosis is present. There is a  bioprosthetic valve present in the aortic position.   7. Aortic dilatation noted. There is mild dilatation of the aortic root,  measuring 43 mm. There is mild dilatation of the ascending aorta,  measuring 43 mm.   8. The inferior vena cava is normal in size with greater than 50%  respiratory variability, suggesting right atrial pressure of 3 mmHg.  MV Peak grad:  17.1 mmHg  MV Mean grad:  7.0 mmHg      MV Vmax:       2.07 m/s      MV Decel  Time: 392 msec    Echocardiogram 10/13/2021  1. Left ventricular ejection fraction, by estimation, is 60 to 65%. The  left ventricle has normal  function. The left ventricle has no regional  wall motion abnormalities.   2. Right ventricular systolic function is normal. The right ventricular  size is mildly enlarged.   3. Left atrial size was mildly dilated.   4. The mitral valve has been repaired/replaced. No evidence of mitral  valve regurgitation. No evidence of mitral stenosis. The mean mitral valve  gradient is 9.7 mmHg. There is a 25 mm bioprosthetic valve present in the  mitral position.   5. The aortic valve has been repaired/replaced. Aortic valve  regurgitation is not visualized. No aortic stenosis is present. There is a  bioprosthetic valve present in the aortic position. Echo findings are  consistent with normal structure and function  of the aortic valve prosthesis. Aortic valve mean gradient measures 12.6  mmHg. Aortic valve Vmax measures 2.28 m/s.   6. Aortic dilatation noted. There is mild dilatation of the ascending  aorta, measuring 43 mm.   7. The inferior vena cava is normal in size with greater than 50%  respiratory variability, suggesting right atrial pressure of 3 mmHg.   Comparison(s): Prior images reviewed side by side. Prior MV mean gradient  7 mmHg.  MV Area (PHT): 2.24 cm      MV Peak grad:  16.2 mmHg     MV Mean grad:  9.7 mmHg  MV Vmax:       2.01 m/s      MV Vmean:      150.0 cm/s    MV Decel Time: 338 msec  TR Vmax:        268.00 cm/s     Labs/Other Tests and Data Reviewed:   CT angiogram of head and neck 06/18/2021 No evidence of acute brain infarction. Chronic small-vessel ischemic change of the white matter. Old lacunar infarction left caudate head. No intracranial large vessel occlusion or correctable proximal stenosis. Aortic atherosclerosis. Atherosclerotic disease at both carotid artery bifurcations. 30-40% stenosis of the proximal ICA on the right. 30% stenosis of the proximal ICA on the left.  EKG: Electrocardiogram was checked today, personally reviewed, shows atrial paced,  ventricular paced rhythm with a rather broad QRS at 204 ms, with positive R wave in lead V1.  QTc 552 ms.  Similar to previous tracings.  On my review of her chest CTs it's highly suspicious that the tip of the right ventricular pacing lead is in the pericardial space.  This would explain the positive R waves in leads V1.  Recent Labs: 01/08/2021: TSH 2.08 06/09/2021: ALT 41; BUN 17; Creatinine, [name] 0.88; Potassium 4.0; Sodium 139 08/06/2021: Hemoglobin 13.3; Platelets 133   Recent Lipid Panel Lab Results  Component Value Date/Time   CHOL 171 06/09/2021 08:14 AM   TRIG 119.0 06/09/2021 08:14 AM   HDL 59.00 06/09/2021 08:14 AM   CHOLHDL 3 06/09/2021 08:14 AM   LDLCALC 88 06/09/2021 08:14 AM   LDLDIRECT 111.0 06/26/2015 09:28 AM    Wt Readings from Last 3 Encounters:  10/18/21 142 lb 3.2 oz (64.5 kg)  08/24/21 141 lb 3.2 oz (64 kg)  08/18/21 140 lb 9.6 oz (63.8 kg)     Objective:    Vital Signs:  BP 132/84 (BP Location: Left Arm, Patient Position: Sitting, Cuff Size: Normal)    Pulse (!) 105    Ht _0  (1.626 m)    Wt 142  lb 3.2 oz (64.5 kg)    SpO2 96%    BMI 24.41 kg/m       General: Alert, oriented x3, no distress, well-healed pacemaker site Head: no evidence of trauma, PERRL, EOMI, no exophtalmos or lid lag, no myxedema, no xanthelasma; normal ears, nose and oropharynx Neck: normal jugular venous pulsations and no hepatojugular reflux; brisk carotid pulses without delay and no carotid bruits Chest: clear to auscultation, no signs of consolidation by percussion or palpation, normal fremitus, symmetrical and full respiratory excursions Cardiovascular: normal position and quality of the apical impulse, regular rhythm, normal first and widely split second heart sounds, 1/6 aortic ejection murmur, no diastolic murmurs, rubs or gallops Abdomen: no tenderness or distention, no masses by palpation, no abnormal pulsatility or arterial bruits, normal bowel sounds, no  hepatosplenomegaly Extremities: no clubbing, cyanosis or edema; 2+ radial, ulnar and brachial pulses bilaterally; 2+ right femoral, posterior tibial and dorsalis pedis pulses; 2+ left femoral, posterior tibial and dorsalis pedis pulses; no subclavian or femoral bruits Neurological: grossly nonfocal Psych: Normal mood and affect     ASSESSMENT & PLAN:    No diagnosis found.     CHF: Remains asymptomatic, NYHA functional class I.develops mild ankle edema on the right side for which she takes an occasional furosemide once weekly or less frequently than that..  Previously had decompensation due to mitral bioprosthetic valve thrombosis and increased mitral stenosis. Prosthetic MV stenosis: Transient, related to thrombotic leaflet restriction.  Mean mitral valve gradients have been stable at around 7-9 mmHg, most recently evaluated in June 2022.  We switched from warfarin back to Eliquis due to problems with maintaining INR in therapeutic range and easy bleeding problems when INR was high.  There is really no change in her mitral valve gradients on the most recent echocardiogram.  Reevaluate clinically in 6 months and repeat the echo in 12 months. S/p AVR: Normal function of the aortic valve biological prosthesis on recent echo.  Stable gradients.  Aware of the need for endocarditis prophylaxis. 2nd/3rd deg AV block: She has virtually 100% ventricular pacing but this has not been associated with heart failure exacerbation.  Currently she appears to be in third-degree heart block, but is not pacemaker dependent with an escape rhythm at about 35-40 bpm Pacemaker: Normal device function virtually 100% ventricular paced.  Followed by Dr. Curt Bears. Atrial fibrillation: She was in atrial fibrillation essentially 100% of 2021, but there is been a remarkable reduction in atrial fibrillation burden in the last 12 months.  She has had some lengthy episodes of atrial fibrillation in May, July and November, none of  them associated with symptomatic deterioration.  A. fib counters were reset today (they show a 29% burden of atrial fibrillation since July 2020). Anticoagulation: Despite the presence of biological aortic and mitral valve prostheses, we have switched from warfarin to Eliquis due to difficulty with maintaining INR levels in therapeutic range and bleeding complications. History of syncope: No current complaints , history of syncope on higher doses of bisoprolol related to orthostatic hypotension.  Tolerating metoprolol well.   COPD: No recent episodes of exacerbation.  Denies wheezing.  Continue Trelegy. 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  Improving on most recent scan from November 2022.  Recently saw Dr. [name] Recurrent vision loss: She has an appointment with a retina specialist (Dr. Gala Murdoch in the) next week.  He is very apprehensive.  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  When she had her initial event, I was concerned that she had thromboembolism from her dysfunctional biological prosthesis and/or atrial fibrillation.  Since the episodes are repetitive, this makes cardioembolic events virtually impossible.  It suggests a local problem.  She tells me that ophthalmological exams have been normal.  She also has problems with balance and has newly discovered problems finding words.  She would be a very unusual age to develop multiple sclerosis.  Consider temporal arteritis, but she does not have headaches or temporal tenderness, systemic symptoms or a history of collagen vascular disease. Has mild macrocytosis, but normal B12 level 05/2020.  I am puzzled by her neurological complaints and have recommended evaluation by a specialist.  She would like a new opinion and I referred her to [facility]. Ascending aortic aneurysm: Very mild increase in diameter 4.2 cm,  essentially stable in size on serial imaging studies  HLP: All lipid parameters are in target range.  Continue statin.  DISH: spinal injection did not help.  Limits her activity.  Evaluated by neurology as well who recommended using a cane or walker. Memory problems: Did well on MMSE (score 30).  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  Note is made of atherosclerotic calcification of the intracranial arteries at the base of the brain..  According to Dr. Gladstone Lighter note, he thinks the memory issue is mostly related to anxiety and insomnia and he did not recommend specific evaluation or treatment. ASCVD: Has aortic atherosclerosis. 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  No significant stenosis of the intracranial circulation.   There are no Patient Instructions on file for this visit.  Signed, Diane Klein, MD  10/18/2021 8:40 AM    [facility]

## 2021-10-19 NOTE — Telephone Encounter (Signed)
Contacted pt to inquire how she is doing with Eliquis. Pt reports doing well. No further f/u is needed by coumadin clinic. Advised if anything is needed to contact coumadin clinic. Pt appreciative and verbalized understanding.

## 2021-10-29 ENCOUNTER — Ambulatory Visit (INDEPENDENT_AMBULATORY_CARE_PROVIDER_SITE_OTHER): Payer: Medicare Other

## 2021-10-29 DIAGNOSIS — I442 Atrioventricular block, complete: Secondary | ICD-10-CM

## 2021-10-29 LAB — CUP PACEART REMOTE DEVICE CHECK
Battery Remaining Longevity: 48 mo
Battery Remaining Percentage: 44 %
Battery Voltage: 2.96 V
Brady Statistic AP VP Percent: 39 %
Brady Statistic AP VS Percent: 1.6 %
Brady Statistic AS VP Percent: 59 %
Brady Statistic AS VS Percent: 1 %
Brady Statistic RA Percent Paced: 40 %
Brady Statistic RV Percent Paced: 98 %
Date Time Interrogation Session: 20230120070745
Implantable Lead Implant Date: 20170907
Implantable Lead Implant Date: 20170907
Implantable Lead Location: 753859
Implantable Lead Location: 753860
Implantable Pulse Generator Implant Date: 20170907
Lead Channel Impedance Value: 430 Ohm
Lead Channel Impedance Value: 530 Ohm
Lead Channel Pacing Threshold Amplitude: 0.75 V
Lead Channel Pacing Threshold Amplitude: 0.75 V
Lead Channel Pacing Threshold Pulse Width: 0.5 ms
Lead Channel Pacing Threshold Pulse Width: 0.5 ms
Lead Channel Sensing Intrinsic Amplitude: 4.1 mV
Lead Channel Sensing Intrinsic Amplitude: 7.2 mV
Lead Channel Setting Pacing Amplitude: 1 V
Lead Channel Setting Pacing Amplitude: 2 V
Lead Channel Setting Pacing Pulse Width: 0.5 ms
Lead Channel Setting Sensing Sensitivity: 2 mV
Pulse Gen Model: 2272
Pulse Gen Serial Number: 7945283

## 2021-11-09 ENCOUNTER — Other Ambulatory Visit: Payer: Self-pay

## 2021-11-09 NOTE — Telephone Encounter (Signed)
Request from [facility] for refill on Tramadol Last refilled on 10/14/21 # 40 with 0 refill LOV 08/10/21 Wound check  No upcoming appointments

## 2021-11-10 MED ORDER — TRAMADOL HCL 50 MG PO TABS: ORAL_TABLET | ORAL | 0 refills | Status: DC

## 2021-11-10 NOTE — Progress Notes (Signed)
Remote pacemaker transmission.   

## 2021-11-10 NOTE — Telephone Encounter (Signed)
ERx 

## 2021-11-12 ENCOUNTER — Other Ambulatory Visit: Payer: Self-pay | Admitting: Internal Medicine

## 2021-11-12 ENCOUNTER — Other Ambulatory Visit: Payer: Self-pay | Admitting: Family Medicine

## 2021-11-12 DIAGNOSIS — Z1231 Encounter for screening mammogram for malignant neoplasm of breast: Secondary | ICD-10-CM

## 2021-11-18 NOTE — Progress Notes (Signed)
NEUROLOGY CONSULTATION NOTE  [name] MRN: [id number] DOB: Jun 12, 1946  Referring provider: Ria Bush, MD Primary care provider: Ria Bush, MD  Reason for consult:  vision loss, memory loss  Assessment/Plan:   Recurrent vision loss in left eye - unlikely to be vascular or cardioembolic event as it involves the same eye and there are no focal arterial stenoses (such as left ICA).  No history of migraines and without positive visual phenomena, unlikely to represent ocular migraine.  Unlikely to be an ischemic optic neuropathy as symptoms are so brief.  I unfortunately do not have a definitive answer but recommendation is to continue management of stroke risk factors. 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  History supplemented by neurology and referring provider's notes.  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  Since last year she has had recurrent episodes of visual disturbance in the left eye, semiology consistent with amaurosis fugax  Looks like a shade goes down and blacks out vision in left eye.  1 to 2 minutes.  No flashes of light or positive symptoms in eye.  No headache, unilateral numbness or weakness.  At least 3-4 times in last 6 months.  In the past, events occurred in setting of subtherapeutic INR.  However, that has not always been the case.  Cardiology did not believe these events to be cardioembolic as they are habitual.  She saw outside neurology in August.  CTA of head and neck on 06/18/2021 personally reviewed showed atherosclerotic disease at both carotid artery bifurcations but no hemodynamically  significant intracranial and extracranial stenosis.  CT head demonstrated chronic small vessel ischemic changes and remote lacunar infarct at the left caudate head but no acute findings.  She saw her eye doctor and was diagnosed with dry macular degeneration.  Otherwise no abnormalities on exam.  No history of migraines/headaches.  She is also concerned about memory problems over the past year.  Last year, TSH was 2.08 and B12 was 614.  She reports problems remembering events with family from last year.  No recollections.  Even with prompting.  She is independent.  Appointments written down, write notes for herself, drives without getting lost, no problems remembering to take medications or manage finances.  No known family history of dementia.  PAST MEDICAL HISTORY: Past Medical History:  Diagnosis Date   Acute cystitis without hematuria 12/22/2015   Anemia    Arthritis    CAP (community acquired pneumonia) 02/08/2016   Cataracts, bilateral    immature   Chronic cholecystitis with calculus 01/21/2015   S/p cholecystectomy    Chronic diastolic heart failure ([place]) 09/19/2014   Chronic insomnia    Constipation    takes Miralax daily as needed   COPD (chronic obstructive pulmonary disease) (HCC)    Albuterol inhaler daily as needed;Duoneb daily as needed;Spiriva daily   DDD (degenerative disc disease)    cervical - kyphosis with mod DD changes C5/6 and C6/7; lumbar - early DD at L2/3 ([place])   Dyspnea    with exertion   Essential hypertension    was on meds but after appointment Dec 11  with Dr.C he took her off   GERD (gastroesophageal reflux disease)    takes Protonix daily   History of pneumonia 02/16/2016   History of radiation therapy 05/30/11 to 07/07/11   rectum   History of shingles    Hyperlipemia    takes Lovastatin daily   Legally blind in left eye, as defined in [place]    Lung nodule    RLL nodule-48mm stable 2006, April 2009, and June 2009   Obesity    Osteopenia 12/2014    T -1.5 hip   Pancreatitis 11/2014   ?zpack related vs gallstone pancreatitis with abnormal HIDA scan pending cholecystectomy   PONV (postoperative nausea and vomiting)    Presence of permanent cardiac pacemaker    Rectal cancer ([place]) 04/2011   T3N0; s/p lap LAR, s/p ileostomy, s/p reversal, s/p chemo   Research study patient 08/26/2016   Rheumatic heart disease mitral stenosis    mod MS by echo 02/2014   S/P AVR (aortic valve replacement) 2015   bioprosthetic ([place])   S/P MVR (mitral valve replacement) 2015   bioprosthetic ([place])   Seizures (HCC)    febrile seizures as a child   Sepsis secondary to UTI ([place]) 08/22/2017   Severe aortic valve stenosis  AVR 3/15 10/01/2013   mild-mod by echo 02/2014   Sleep apnea    PT TOLD BORDERLINE-TRIED CPAP-DID NOT HELP-SHE DOES NOT USE CPAP   Small bowel obstruction, partial ([place]) 08/2013   reolved without NGT placement.    Vitamin B12 deficiency 02/28/2015   Start B12 shots 02/2015     PAST SURGICAL HISTORY: Past Surgical History:  Procedure Laterality Date   ANTERIOR LAT LUMBAR FUSION Left 07/17/2018   Procedure: Lumbar Two-Three Lumbar Three-Four Anterolateral decompression/interbody fusion with lateral plate fixation/Infuse;  Surgeon: Kristeen Miss, MD;  Location: [facility];  Service: Neurosurgery;  Laterality: Left;  Lumbar Two-Three Lumbar Three-Four Anterolateral decompression/interbody fusion with lateral plate fixation/Infuse   AORTIC VALVE REPLACEMENT N/A 12/12/2013   Procedure: AORTIC VALVE REPLACEMENT (AVR);  Surgeon: Gaye Pollack, MD; Service: Open Heart Surgery   BACK SURGERY  2006, 2007   2006 SPACER, 2007 decompression and fusion L4/5   BOWEL RESECTION  04/02/2012   Procedure: SMALL BOWEL RESECTION;  Surgeon: Stark Klein, MD;  Location: [facility];  Service: General;  Laterality: N/A;   BRONCHIAL WASHINGS  03/24/2021   Procedure: BRONCHIAL WASHINGS;  Surgeon: Brand Males, MD;  [facility];  Service: Endoscopy;;    CARPAL TUNNEL RELEASE Bilateral    CATARACT EXTRACTION W/ INTRAOCULAR LENS IMPLANT Left 10/18/2016   CATARACT EXTRACTION W/ INTRAOCULAR LENS IMPLANT Right 12/13/2016   Dr. Kathrin Penner   CHOLECYSTECTOMY N/A 01/21/2015   chronic cholecystitis, Stark Klein, MD   COLON RESECTION  08/25/2011   Procedure: COLON RESECTION LAPAROSCOPIC;  Surgeon: Stark Klein, MD;  [facility]: [facility];  Service: General;  Laterality: N/A;  Laparoscopic Assisted Low Anterior Resection Diverting Ostomy and onQ pain pump   COLONOSCOPY  03/2013   1 polyp, rpt 3 yrs Ardis Hughs)   COLONOSCOPY  05/2021   diverticulosis and patent colon anastomosis with some edema/narrowing, rpt 5 yrs Ardis Hughs)   COLONOSCOPY WITH PROPOFOL N/A 06/09/2016   patent colo-colonic anastomosis, rpt 5 yrs Ardis Hughs)   EP IMPLANTABLE DEVICE N/A 06/16/2016   Procedure: Pacemaker Implant;  Surgeon: Will Meredith Leeds, MD;  Location: [facility];  Service: Cardiovascular;  Laterality: N/A;   FOOT SURGERY  left foot   hammer toe   ILEOSTOMY  08/25/2011  ILEOSTOMY CLOSURE  04/02/2012   Procedure: ILEOSTOMY TAKEDOWN;  Surgeon: Stark Klein, MD;  [facility];  Service: General;  Laterality: N/A;   INTRAOPERATIVE TRANSESOPHAGEAL ECHOCARDIOGRAM N/A 12/12/2013   Procedure: INTRAOPERATIVE TRANSESOPHAGEAL ECHOCARDIOGRAM;  Surgeon: Gaye Pollack, MD;  Location: [facility];  Service: Open Heart Surgery;  Laterality: N/A;   LEFT AND RIGHT HEART CATHETERIZATION WITH CORONARY ANGIOGRAM N/A 11/27/2013   Procedure: LEFT AND RIGHT HEART CATHETERIZATION WITH CORONARY ANGIOGRAM;  Surgeon: Sanda Klein, MD;  Location: [facility];  Service: Cardiovascular;  Laterality: N/A;   MITRAL VALVE REPLACEMENT N/A 12/12/2013   Procedure: MITRAL VALVE (MV) REPLACEMENT OR REPAIR;  Surgeon: Gaye Pollack, MD; Service: Open Heart Surgery   TEE WITHOUT CARDIOVERSION N/A 11/29/2013   Procedure: TRANSESOPHAGEAL ECHOCARDIOGRAM (TEE);  Surgeon: Sueanne Margarita, MD;  Location: [facility]OSCOPY;  Service: Cardiovascular;  Laterality: N/A;   TEE WITHOUT CARDIOVERSION N/A 06/04/2019   Procedure: TRANSESOPHAGEAL ECHOCARDIOGRAM (TEE);  Surgeon: Sanda Klein, MD;  Location: [facility];  Service: Cardiovascular;  Laterality: N/A;   TONSILLECTOMY     VIDEO BRONCHOSCOPY N/A 03/24/2021   Procedure: VIDEO BRONCHOSCOPY WITHOUT FLUORO;  Surgeon: Brand Males, MD;  [facility];  Service: Endoscopy;  Laterality: N/A;  SMALL SCOPE,PREMEDICATE WITH NEBULIZED LIDOCAINE    MEDICATIONS: Current Outpatient Medications on File Prior to Visit  Medication Sig Dispense Refill   acetaminophen (TYLENOL) 500 MG tablet Take 2 tablets (1,000 mg total) by mouth daily.     albuterol (VENTOLIN HFA) 108 (90 Base) MCG/ACT inhaler Inhale 2 puffs into the lungs every 6 (six) hours as needed for wheezing. (Patient not taking: Reported on 10/18/2021) 18 g 6   amoxicillin (AMOXIL) 500 MG capsule TAKE 4 CAPSULES BY MOUTH 30-60 MINUTES PRIOR TO APPOINTMENT (Patient not taking: Reported on 10/18/2021) 4 capsule 5   apixaban (ELIQUIS) 5 MG TABS tablet Take 1 tablet (5 mg total) by mouth 2 (two) times daily. 60 tablet 11   Biotin 1000 MCG tablet Take 1,000 mcg by mouth daily.     calcium carbonate (OSCAL) 1500 (600 Ca) MG TABS tablet Take 600 mg of elemental calcium by mouth daily.     carboxymethylcellulose (REFRESH PLUS) 0.5 % SOLN Place 1 drop into both eyes 3 (three) times daily as needed (dry eyes). (Patient not taking: Reported on 10/18/2021)     cephALEXin (KEFLEX) 250 MG capsule Take 250 mg by mouth daily.     Cholecalciferol (VITAMIN D) 50 MCG (2000 UT) tablet Take 2,000 Units by mouth daily.     docusate sodium (COLACE) 100 MG capsule Take 200 mg by mouth every evening.     Fluticasone-Umeclidin-Vilant (TRELEGY ELLIPTA) 200-62.5-25 MCG/ACT AEPB INHALE 1 PUFF INTO THE LUNGS DAILY 60 each 11   furosemide (LASIX) 40 MG tablet Take 1 tablet (40 mg total) by mouth daily. May take an extra tablet  daily as needed for swelling. 45 tablet 11   gabapentin (NEURONTIN) 300 MG capsule Take 300 mg by mouth at bedtime.     GEMTESA 75 MG TABS Take 75 mg by mouth daily.     ipratropium-albuterol (DUONEB) 0.5-2.5 (3) MG/3ML SOLN Take 3 mLs by nebulization every 6 (six) hours as needed. (Patient not taking: Reported on 10/18/2021) 360 mL 0   lovastatin (MEVACOR) 40 MG tablet TAKE 1 TABLET(40 MG) BY MOUTH AT BEDTIME 90 tablet 1   montelukast (SINGULAIR) 10 MG tablet TAKE 1 TABLET(10 MG) BY MOUTH AT BEDTIME 90 tablet 1   Multiple Vitamins-Minerals (PRESERVISION AREDS 2 PO)  Take 2 capsules by mouth every evening.      polyethylene glycol (MIRALAX / GLYCOLAX) packet Take 17 g by mouth daily. (Patient not taking: Reported on 10/18/2021) 14 each 0   potassium chloride (KLOR-CON) 10 MEQ tablet TAKE 2 TABLETS(20 MEQ) BY MOUTH DAILY 60 tablet 9   silver sulfADIAZINE (SILVADENE) 1 % cream Apply 1 application topically daily. (Patient not taking: Reported on 10/18/2021) 50 g 1   traMADol (ULTRAM) 50 MG tablet TAKE 1 TABLET(50 MG) BY MOUTH TWICE DAILY AS NEEDED FOR MODERATE PAIN 40 tablet 0   traZODone (DESYREL) 50 MG tablet Take 0.5-1 tablets (25-50 mg total) by mouth at bedtime. 30 tablet 6   vitamin B-12 (CYANOCOBALAMIN) 500 MCG tablet Take 500 mcg by mouth every other day.      Zinc 50 MG CAPS Take 1 capsule (50 mg total) by mouth daily.  0   Current Facility-Administered Medications on File Prior to Visit  Medication Dose Route Frequency Provider Last Rate Last Admin   ondansetron (ZOFRAN) 4 mg in sodium chloride 0.9 % 50 mL IVPB  4 mg Intravenous Q6H PRN Kristeen Miss, MD        ALLERGIES: Allergies  Allergen Reactions   Codeine Nausea Only   Sulfa Antibiotics Nausea Only   Sulfonamide Derivatives Nausea Only    FAMILY HISTORY: Family History  Problem Relation Age of Onset   Heart disease Mother    Diabetes Mother    Hypertension Mother    Stroke Mother    Lung cancer Father    Breast cancer  Maternal Aunt 83   Heart attack Maternal Grandfather    CAD Son 27   Heart attack Son 59   Rectal cancer Neg Hx    Stomach cancer Neg Hx    Esophageal cancer Neg Hx    Colon cancer Neg Hx     Objective:  Blood pressure (!) 103/59, pulse 98, height 5\' 4"  (1.626 m), weight 146 lb (66.2 kg), SpO2 96 %. General: No acute distress.  Patient appears well-groomed.   Head:  Normocephalic/atraumatic Eyes:  fundi examined but not visualized Neck: supple, no paraspinal tenderness, full range of motion Back: No paraspinal tenderness Heart: regular rate and rhythm Lungs: Clear to auscultation bilaterally. Vascular: No carotid bruits. Neurological Exam: Mental status:  [employer] Mental Exam 11/19/2021  Weekday Correct 1  Current year 1  What state are we in? 1  Amount spent 1  Amount left 0  # of Animals 2  5 objects recall 5  Number series 1  Hour markers 0  Time correct 0  Placed X in triangle correctly 1  Largest Figure 1  Name of female 2  Date back to work 2  Type of work 2  State she lived in 2  Total score 22   Cranial nerves: CN I: not tested CN II: pupils equal, round and reactive to light, visual fields intact CN III, IV, VI:  full range of motion, no nystagmus, no ptosis CN V: facial sensation intact. CN VII: upper and lower face symmetric CN VIII: hearing intact CN IX, X: gag intact, uvula midline CN XI: sternocleidomastoid and trapezius muscles intact CN XII: tongue midline Bulk & Tone: normal, no fasciculations. Motor:  muscle strength 5/5 throughout Sensation:  Pinprick sensation reduced in first toe of left foot, vibratory sensation intact. Deep Tendon Reflexes:  2+ throughout,  toes downgoing.   Finger to nose testing:  Without dysmetria.   Heel to shin:  Without dysmetria.  Gait:  Normal station and stride.  Romberg negative.    Thank you for allowing me to take part in the care of this patient.  Metta Clines, DO  CC: Ria Bush,  MD

## 2021-11-19 ENCOUNTER — Ambulatory Visit (INDEPENDENT_AMBULATORY_CARE_PROVIDER_SITE_OTHER): Payer: Medicare Other | Admitting: Neurology

## 2021-11-19 ENCOUNTER — Other Ambulatory Visit: Payer: Self-pay

## 2021-11-19 ENCOUNTER — Encounter: Payer: Self-pay | Admitting: Neurology

## 2021-11-19 VITALS — BP 103/59 | HR 98 | Ht 64.0 in | Wt 146.0 lb

## 2021-11-19 DIAGNOSIS — H547 Unspecified visual loss: Secondary | ICD-10-CM | POA: Diagnosis not present

## 2021-11-19 DIAGNOSIS — R413 Other amnesia: Secondary | ICD-10-CM

## 2021-11-19 NOTE — Patient Instructions (Signed)
MRI of brain without contrast Neurocognitive testing Follow up with Sharene Butters, PA-C in the memory clinic.

## 2021-11-24 ENCOUNTER — Encounter: Payer: Self-pay | Admitting: Family Medicine

## 2021-11-24 ENCOUNTER — Other Ambulatory Visit: Payer: Self-pay

## 2021-11-24 ENCOUNTER — Ambulatory Visit (INDEPENDENT_AMBULATORY_CARE_PROVIDER_SITE_OTHER): Payer: Medicare Other | Admitting: Family Medicine

## 2021-11-24 VITALS — BP 116/72 | HR 99 | Temp 97.5°F | Ht 63.0 in | Wt 147.2 lb

## 2021-11-24 DIAGNOSIS — J208 Acute bronchitis due to other specified organisms: Secondary | ICD-10-CM | POA: Diagnosis not present

## 2021-11-24 DIAGNOSIS — J441 Chronic obstructive pulmonary disease with (acute) exacerbation: Secondary | ICD-10-CM | POA: Diagnosis not present

## 2021-11-24 MED ORDER — DOXYCYCLINE HYCLATE 100 MG PO TABS: 100.0000 mg | ORAL_TABLET | Freq: Two times a day (BID) | ORAL | 0 refills | Status: DC

## 2021-11-24 MED ORDER — ALBUTEROL SULFATE HFA 108 (90 BASE) MCG/ACT IN AERS: 2.0000 | INHALATION_SPRAY | Freq: Four times a day (QID) | RESPIRATORY_TRACT | 6 refills | Status: DC | PRN

## 2021-11-24 MED ORDER — PREDNISONE 20 MG PO TABS: ORAL_TABLET | ORAL | 0 refills | Status: DC

## 2021-11-24 NOTE — Progress Notes (Signed)
Diane Everly T. Jarelis Ehlert, MD, [place] at [facility] [place], [address]  Phone: [phone]   FAX: [phone]  [name] - 76 y.o. female   MRN [id number]   Date of Birth: 1946/04/10  Date: 11/24/2021   PCP: Ria Bush, MD   Referral: Ria Bush, MD  Chief Complaint  Patient presents with   Sore Throat    C/o ST, head congestion, HA and rattling in her chest. Sxs started 11/22/20. Rattling in chest is worse this AM.  Neg COVID test this AM.     This visit occurred during the SARS-CoV-2 public health emergency.  Safety protocols were in place, including screening questions prior to the visit, additional usage of staff PPE, and extensive cleaning of exam room while observing appropriate contact time as indicated for disinfecting solutions.   Subjective:   [name] is a 76 y.o. very pleasant female patient with Body mass index is 26.08 kg/m. who presents with the following:  She is a very pleasant young lady who has a history of COPD, she also has a pacemaker.  She presents today with the onset of some coughing, sore throat, congestion as well as some wheezing.  She does not have a fever.  This morning her COVID test was negative.  Onset of symptoms was Monday, November 22, 2020.  She denies GI symptoms.  No neurological changes.  No rashes.  She does have a long-term history of urinary incontinence, she has baseline on Keflex for this.  Trelegy - sees Dr. [name].   COPD exac    Review of Systems is noted in the HPI, as appropriate  Objective:   BP 116/72    Pulse 99    Temp (!) 97.5 F (36.4 C) (Temporal)    Ht 5\' 3"  (1.6 m)    Wt 147 lb 4 oz (66.8 kg)    SpO2 94%    BMI 26.08 kg/m   Gen: WDWN, NAD. Globally Non-toxic HEENT: Throat clear, w/o exudate, R TM clear, L TM - good landmarks, No fluid present. rhinnorhea.  MMM Frontal sinuses: NT Max sinuses: NT NECK: Anterior  cervical  LAD is absent CV: RRR, No M/G/R, cap refill <2 sec PULM: Breathing comfortably in no respiratory distress.  She does have bilateral wheezes without focal crackles.  Laboratory and Imaging Data:  Assessment and Plan:     ICD-10-CM   1. Acute bronchitis due to other specified organisms  J20.8     2. COPD exacerbation (HCC)  J44.1      In addition to baseline pulmonary medicines, add in a refill of her albuterol.  Antibiotics, and prednisone with her wheezing.  I am also going to have her recheck a COVID-19 test on Friday.  Alter treatment and initiate antivirals if positive.  She understands red flags.  Meds ordered this encounter  Medications   albuterol (VENTOLIN HFA) 108 (90 Base) MCG/ACT inhaler    Sig: Inhale 2 puffs into the lungs every 6 (six) hours as needed for wheezing.    Dispense:  18 g    Refill:  6   doxycycline (VIBRA-TABS) 100 MG tablet    Sig: Take 1 tablet (100 mg total) by mouth 2 (two) times daily.    Dispense:  20 tablet    Refill:  0   predniSONE (DELTASONE) 20 MG tablet    Sig: 2 tabs po for 4 days, then 1 tab po for 4 days  Dispense:  12 tablet    Refill:  0   Medications Discontinued During This Encounter  Medication Reason   albuterol (VENTOLIN HFA) 108 (90 Base) MCG/ACT inhaler Reorder   amoxicillin (AMOXIL) 500 MG capsule    No orders of the defined types were placed in this encounter.   Follow-up: No follow-ups on file.  [employer] One speech-to-text software was used for transcription in this dictation.  Possible transcriptional errors can occur using Editor, commissioning.   Signed,  Maud Deed. [name], MD   Outpatient Encounter Medications as of 11/24/2021  Medication Sig   acetaminophen (TYLENOL) 500 MG tablet Take 2 tablets (1,000 mg total) by mouth daily.   apixaban (ELIQUIS) 5 MG TABS tablet Take 1 tablet (5 mg total) by mouth 2 (two) times daily.   Biotin 1000 MCG tablet Take 1,000 mcg by mouth daily.   calcium  carbonate (OSCAL) 1500 (600 Ca) MG TABS tablet Take 600 mg of elemental calcium by mouth daily.   carboxymethylcellulose (REFRESH PLUS) 0.5 % SOLN Place 1 drop into both eyes 3 (three) times daily as needed (dry eyes).   cephALEXin (KEFLEX) 250 MG capsule Take 250 mg by mouth daily.   Cholecalciferol (VITAMIN D) 50 MCG (2000 UT) tablet Take 2,000 Units by mouth daily.   docusate sodium (COLACE) 100 MG capsule Take 200 mg by mouth every evening.   doxycycline (VIBRA-TABS) 100 MG tablet Take 1 tablet (100 mg total) by mouth 2 (two) times daily.   Fluticasone-Umeclidin-Vilant (TRELEGY ELLIPTA) 200-62.5-25 MCG/ACT AEPB INHALE 1 PUFF INTO THE LUNGS DAILY   furosemide (LASIX) 40 MG tablet Take 1 tablet (40 mg total) by mouth daily. May take an extra tablet daily as needed for swelling.   gabapentin (NEURONTIN) 300 MG capsule Take 300 mg by mouth at bedtime.   GEMTESA 75 MG TABS Take 75 mg by mouth daily.   ipratropium-albuterol (DUONEB) 0.5-2.5 (3) MG/3ML SOLN Take 3 mLs by nebulization every 6 (six) hours as needed.   lovastatin (MEVACOR) 40 MG tablet TAKE 1 TABLET(40 MG) BY MOUTH AT BEDTIME   montelukast (SINGULAIR) 10 MG tablet TAKE 1 TABLET(10 MG) BY MOUTH AT BEDTIME   Multiple Vitamins-Minerals (PRESERVISION AREDS 2 PO) Take 2 capsules by mouth every evening.    polyethylene glycol (MIRALAX / GLYCOLAX) packet Take 17 g by mouth daily.   potassium chloride (KLOR-CON) 10 MEQ tablet TAKE 2 TABLETS(20 MEQ) BY MOUTH DAILY   predniSONE (DELTASONE) 20 MG tablet 2 tabs po for 4 days, then 1 tab po for 4 days   silver sulfADIAZINE (SILVADENE) 1 % cream Apply 1 application topically daily.   traMADol (ULTRAM) 50 MG tablet TAKE 1 TABLET(50 MG) BY MOUTH TWICE DAILY AS NEEDED FOR MODERATE PAIN   traZODone (DESYREL) 50 MG tablet Take 0.5-1 tablets (25-50 mg total) by mouth at bedtime.   vitamin B-12 (CYANOCOBALAMIN) 500 MCG tablet Take 500 mcg by mouth every other day.    Zinc 50 MG CAPS Take 1 capsule (50 mg  total) by mouth daily.   [DISCONTINUED] albuterol (VENTOLIN HFA) 108 (90 Base) MCG/ACT inhaler Inhale 2 puffs into the lungs every 6 (six) hours as needed for wheezing.   [DISCONTINUED] amoxicillin (AMOXIL) 500 MG capsule TAKE 4 CAPSULES BY MOUTH 30-60 MINUTES PRIOR TO APPOINTMENT   albuterol (VENTOLIN HFA) 108 (90 Base) MCG/ACT inhaler Inhale 2 puffs into the lungs every 6 (six) hours as needed for wheezing.   Facility-Administered Encounter Medications as of 11/24/2021  Medication   ondansetron (ZOFRAN) 4  mg in sodium chloride 0.9 % 50 mL IVPB

## 2021-11-26 ENCOUNTER — Telehealth: Payer: Self-pay | Admitting: Family Medicine

## 2021-11-26 MED ORDER — MOLNUPIRAVIR EUA 200MG CAPSULE: 4.0000 | ORAL_CAPSULE | Freq: Two times a day (BID) | ORAL | 0 refills | Status: AC

## 2021-11-26 NOTE — Addendum Note (Signed)
Addended by: Owens Loffler on: 11/26/2021 01:25 PM   Modules accepted: Orders

## 2021-11-26 NOTE — Telephone Encounter (Signed)
I sent in antivirals for her.

## 2021-11-26 NOTE — Telephone Encounter (Signed)
done

## 2021-11-26 NOTE — Telephone Encounter (Signed)
Spoke with notifying her rx for COVID was sent in.  Pt is asking if she is to continue prednisone and doxycycline also.  Plz advise.

## 2021-11-26 NOTE — Telephone Encounter (Signed)
[name] called in and stated that she saw Dr. Lorelei Pont on 2/15 snd he told her to test again and she did and she tested positive for covid and wanted to start the medication

## 2021-11-26 NOTE — Telephone Encounter (Signed)
Call Continue prednisone but stop the antibiotics

## 2021-11-29 NOTE — Telephone Encounter (Signed)
Spoke with pt relaying Dr. Rometta Emery message.  Pt verbalizes understanding.

## 2021-12-02 ENCOUNTER — Telehealth: Payer: Self-pay | Admitting: Neurology

## 2021-12-02 ENCOUNTER — Other Ambulatory Visit: Payer: Self-pay | Admitting: Neurology

## 2021-12-02 ENCOUNTER — Other Ambulatory Visit: Payer: Self-pay

## 2021-12-02 DIAGNOSIS — R413 Other amnesia: Secondary | ICD-10-CM

## 2021-12-02 DIAGNOSIS — H547 Unspecified visual loss: Secondary | ICD-10-CM

## 2021-12-02 NOTE — Telephone Encounter (Signed)
Patient advised order placed at [facility] for [facility]

## 2021-12-02 NOTE — Telephone Encounter (Signed)
Reordered MRI, sent to [place] [facility]

## 2021-12-02 NOTE — Telephone Encounter (Signed)
Patient said the facility where she was sent for MRI cannot do the test because she has a pacemaker.

## 2021-12-03 ENCOUNTER — Encounter: Payer: Self-pay | Admitting: Family Medicine

## 2021-12-05 ENCOUNTER — Encounter: Payer: Self-pay | Admitting: Family Medicine

## 2021-12-07 NOTE — Telephone Encounter (Signed)
Pt called to follow up on Mychart

## 2021-12-07 NOTE — Telephone Encounter (Signed)
[name] is calling back due to she hasnt received a phone call from Dr. Darnell Level or [name]

## 2021-12-07 NOTE — Telephone Encounter (Signed)
Spoke with pt about MyChart response to her questions.  Says she just got my message, verbalizes understanding and expresses her thanks.

## 2021-12-10 NOTE — Telephone Encounter (Signed)
This was addressed by [name].  ?

## 2021-12-20 ENCOUNTER — Encounter: Payer: Self-pay | Admitting: Family Medicine

## 2021-12-21 NOTE — Telephone Encounter (Signed)
Name of Medication: Tramadol ?Name of Pharmacy: [facility] [address] ?Last Fill or Written Date and Quantity: 11/10/21, #40 ?Last Office Visit and Type: 08/10/21, skin tear ?Next Office Visit and Type: 01/03/22, f/u w/labs ?Last Controlled Substance Agreement Date: none ?Last UDS: none ? ? ?

## 2021-12-22 MED ORDER — TRAMADOL HCL 50 MG PO TABS: ORAL_TABLET | ORAL | 0 refills | Status: DC

## 2021-12-22 NOTE — Telephone Encounter (Signed)
Patient called to follow up on refill. Advised that we require 72 hours for refill but I will send message letting them know she followed up on it.  ?

## 2021-12-22 NOTE — Telephone Encounter (Signed)
ERx 

## 2022-01-02 ENCOUNTER — Other Ambulatory Visit: Payer: Self-pay | Admitting: Cardiovascular Disease

## 2022-01-03 ENCOUNTER — Encounter: Payer: Self-pay | Admitting: Family Medicine

## 2022-01-03 ENCOUNTER — Other Ambulatory Visit: Payer: Self-pay

## 2022-01-03 ENCOUNTER — Ambulatory Visit (INDEPENDENT_AMBULATORY_CARE_PROVIDER_SITE_OTHER): Payer: Medicare Other | Admitting: Family Medicine

## 2022-01-03 VITALS — BP 138/78 | HR 100 | Temp 98.3°F | Ht 63.0 in | Wt 144.4 lb

## 2022-01-03 DIAGNOSIS — R0609 Other forms of dyspnea: Secondary | ICD-10-CM

## 2022-01-03 DIAGNOSIS — G6289 Other specified polyneuropathies: Secondary | ICD-10-CM | POA: Diagnosis not present

## 2022-01-03 DIAGNOSIS — L989 Disorder of the skin and subcutaneous tissue, unspecified: Secondary | ICD-10-CM

## 2022-01-03 DIAGNOSIS — E538 Deficiency of other specified B group vitamins: Secondary | ICD-10-CM | POA: Diagnosis not present

## 2022-01-03 DIAGNOSIS — Z952 Presence of prosthetic heart valve: Secondary | ICD-10-CM | POA: Diagnosis not present

## 2022-01-03 DIAGNOSIS — G629 Polyneuropathy, unspecified: Secondary | ICD-10-CM | POA: Insufficient documentation

## 2022-01-03 DIAGNOSIS — I7121 Aneurysm of the ascending aorta, without rupture: Secondary | ICD-10-CM

## 2022-01-03 DIAGNOSIS — Z7901 Long term (current) use of anticoagulants: Secondary | ICD-10-CM

## 2022-01-03 DIAGNOSIS — F5104 Psychophysiologic insomnia: Secondary | ICD-10-CM

## 2022-01-03 HISTORY — DX: Disorder of the skin and subcutaneous tissue, unspecified: L98.9

## 2022-01-03 HISTORY — DX: Polyneuropathy, unspecified: G62.9

## 2022-01-03 LAB — BASIC METABOLIC PANEL
BUN: 22 mg/dL (ref 6–23)
CO2: 33 mEq/L — ABNORMAL HIGH (ref 19–32)
Calcium: 9.4 mg/dL (ref 8.4–10.5)
Chloride: 100 mEq/L (ref 96–112)
Creatinine, Ser: 0.96 mg/dL (ref 0.40–1.20)
GFR: 57.47 mL/min — ABNORMAL LOW (ref >60.00)
Glucose, Bld: 94 mg/dL (ref 70–99)
Potassium: 4.2 mEq/L (ref 3.5–5.1)
Sodium: 140 mEq/L (ref 135–145)

## 2022-01-03 LAB — CBC WITH DIFFERENTIAL/PLATELET
Basophils Absolute: 0.1 10*3/uL (ref 0.0–0.1)
Basophils Relative: 0.7 % (ref 0.0–3.0)
Eosinophils Absolute: 0.1 10*3/uL (ref 0.0–0.7)
Eosinophils Relative: 0.7 % (ref 0.0–5.0)
HCT: 39.4 % (ref 36.0–46.0)
Hemoglobin: 13.2 g/dL (ref 12.0–15.0)
Lymphocytes Relative: 7 % — ABNORMAL LOW (ref 12.0–46.0)
Lymphs Abs: 0.6 10*3/uL — ABNORMAL LOW (ref 0.7–4.0)
MCHC: 33.6 g/dL (ref 30.0–36.0)
MCV: 100.7 fl — ABNORMAL HIGH (ref 78.0–100.0)
Monocytes Absolute: 0.7 10*3/uL (ref 0.1–1.0)
Monocytes Relative: 7.9 % (ref 3.0–12.0)
Neutro Abs: 7 10*3/uL (ref 1.4–7.7)
Neutrophils Relative %: 83.7 % — ABNORMAL HIGH (ref 43.0–77.0)
Platelets: 94 10*3/uL — ABNORMAL LOW (ref 150.0–400.0)
RBC: 3.91 Mil/uL (ref 3.87–5.11)
RDW: 13.9 % (ref 11.5–15.5)
WBC: 8.3 10*3/uL (ref 4.0–10.5)

## 2022-01-03 NOTE — Assessment & Plan Note (Signed)
Has done well off temazepam, tolerating trazodone.  ?This was stopped due to concern over effect on memory.  ?

## 2022-01-03 NOTE — Assessment & Plan Note (Signed)
Now on eliquis.  ?

## 2022-01-03 NOTE — Assessment & Plan Note (Addendum)
Handicap placard application filled out for patient.  ?Will fill out [employer] form if necessary.  ?

## 2022-01-03 NOTE — Assessment & Plan Note (Signed)
Cardiology aware.  ?

## 2022-01-03 NOTE — Assessment & Plan Note (Signed)
Now on eliquis in place of coumadin, doing well.  ?

## 2022-01-03 NOTE — Patient Instructions (Addendum)
Labs today to check other causes of neuropathy  ?Handicap placard application provided today .  ?Good to see you today.  ?Return as needed or in 6 months for physical.  ?

## 2022-01-03 NOTE — Assessment & Plan Note (Signed)
She continues to take B12 511mg QOD.  ?Update levels.  ?

## 2022-01-03 NOTE — Assessment & Plan Note (Signed)
No h/o DM or significant alcohol use.  ?Check labs for reversible causes (vitamin deficiency, thyroid disease, etc) ?

## 2022-01-03 NOTE — Progress Notes (Signed)
? ? Patient ID: [name], female    DOB: [birth year], 76 y.o.   MRN: [id number] ? ?This visit was conducted in person. ? ?BP 138/78   Pulse 100   Temp 98.3 ?F (36.8 ?C) (Temporal)   Ht '5\' 3"'$  (1.6 m)   Wt 144 lb 6 oz (65.5 kg)   SpO2 94%   BMI 25.57 kg/m?   ? ?CC: f/u visit  ?Subjective:  ? ?HPI: ?Terry Cassia Fein is a 76 y.o. female presenting on 01/03/2022 for Nevus (C/o mole on upper R buttock/low back.  Denies any irritation/pain.  Noticed 2-3 wks ago when area was itching and pt reached to scratch.  ), Numbness (C/o bilateral foot numbness.  Started mos ago. ), Chills (C/o chilled feeling/sensation that comes over her and lasts a few secs.  Started mos ago. ), and Form Completion (Pt also requests  Disability Parking placard.  Also, requests letter for [employer] stating mail needs to be delivered next house vs at the street due to issues walking.  Pt has to stop to rest after walking a short distance.  ) ? ? ?2-3 wk h/o new skin lesion to R lower back.  ? ?Several months of bilateral foot numbness. Tops and bottoms of feet. Progressively worsening.  ? ?Requests handicap placard application. Will let us know if she needs a letter for [employer] - to keep mail at the side of her house instead of having to walk down the road.  ? ?COVID infection last month. Never had severe symptoms.  ? ?She was able to taper off temazepam, she is taking trazodone '25mg'$  nightly in its place.  ? ?Regularly sees Dr Sallyanne Kuster as well as Dr Tomi Likens. Coumadin was changed to eliquis. Upcoming neuropsychogical eval through Dr Tomi Likens.  ?   ? ?Relevant past medical, surgical, family and social history reviewed and updated as indicated. Interim medical history since our last visit reviewed. ?Allergies and medications reviewed and updated. ?Outpatient Medications Prior to Visit  ?Medication Sig Dispense Refill  ? acetaminophen (TYLENOL) 500 MG tablet Take 2 tablets (1,000 mg total) by mouth daily.    ? albuterol (VENTOLIN HFA) 108 (90  Base) MCG/ACT inhaler Inhale 2 puffs into the lungs every 6 (six) hours as needed for wheezing. 18 g 6  ? apixaban (ELIQUIS) 5 MG TABS tablet Take 1 tablet (5 mg total) by mouth 2 (two) times daily. 60 tablet 11  ? Biotin 1000 MCG tablet Take 1,000 mcg by mouth daily.    ? calcium carbonate (OSCAL) 1500 (600 Ca) MG TABS tablet Take 600 mg of elemental calcium by mouth daily.    ? carboxymethylcellulose (REFRESH PLUS) 0.5 % SOLN Place 1 drop into both eyes 3 (three) times daily as needed (dry eyes).    ? cephALEXin (KEFLEX) 250 MG capsule Take 250 mg by mouth daily.    ? Cholecalciferol (VITAMIN D) 50 MCG (2000 UT) tablet Take 2,000 Units by mouth daily.    ? docusate sodium (COLACE) 100 MG capsule Take 200 mg by mouth every evening.    ? doxycycline (VIBRA-TABS) 100 MG tablet Take 1 tablet (100 mg total) by mouth 2 (two) times daily. 20 tablet 0  ? Fluticasone-Umeclidin-Vilant (TRELEGY ELLIPTA) 200-62.5-25 MCG/ACT AEPB INHALE 1 PUFF INTO THE LUNGS DAILY 60 each 11  ? furosemide (LASIX) 40 MG tablet Take 1 tablet (40 mg total) by mouth daily. May take an extra tablet daily as needed for swelling. 45 tablet 11  ? gabapentin (NEURONTIN) 300 MG capsule  Take 300 mg by mouth at bedtime.    ? GEMTESA 75 MG TABS Take 75 mg by mouth daily.    ? ipratropium-albuterol (DUONEB) 0.5-2.5 (3) MG/3ML SOLN Take 3 mLs by nebulization every 6 (six) hours as needed. 360 mL 0  ? lovastatin (MEVACOR) 40 MG tablet TAKE 1 TABLET(40 MG) BY MOUTH AT BEDTIME 90 tablet 1  ? montelukast (SINGULAIR) 10 MG tablet TAKE 1 TABLET(10 MG) BY MOUTH AT BEDTIME 90 tablet 1  ? Multiple Vitamins-Minerals (PRESERVISION AREDS 2 PO) Take 2 capsules by mouth every evening.     ? polyethylene glycol (MIRALAX / GLYCOLAX) packet Take 17 g by mouth daily. 14 each 0  ? potassium chloride (KLOR-CON) 10 MEQ tablet TAKE 2 TABLETS(20 MEQ) BY MOUTH DAILY 60 tablet 9  ? predniSONE (DELTASONE) 20 MG tablet 2 tabs po for 4 days, then 1 tab po for 4 days 12 tablet 0  ?  silver sulfADIAZINE (SILVADENE) 1 % cream Apply 1 application topically daily. 50 g 1  ? traMADol (ULTRAM) 50 MG tablet TAKE 1 TABLET(50 MG) BY MOUTH TWICE DAILY AS NEEDED FOR MODERATE PAIN 40 tablet 0  ? traZODone (DESYREL) 50 MG tablet Take 0.5-1 tablets (25-50 mg total) by mouth at bedtime. 30 tablet 6  ? vitamin B-12 (CYANOCOBALAMIN) 500 MCG tablet Take 500 mcg by mouth every other day.     ? Zinc 50 MG CAPS Take 1 capsule (50 mg total) by mouth daily.  0  ? ?Facility-Administered Medications Prior to Visit  ?Medication Dose Route Frequency Provider Last Rate Last Admin  ? ondansetron (ZOFRAN) 4 mg in sodium chloride 0.9 % 50 mL IVPB  4 mg Intravenous Q6H PRN Kristeen Miss, MD      ?  ? ?Per HPI unless specifically indicated in ROS section below ?Review of Systems ? ?Objective:  ?BP 138/78   Pulse 100   Temp 98.3 ?F (36.8 ?C) (Temporal)   Ht '5\' 3"'$  (1.6 m)   Wt 144 lb 6 oz (65.5 kg)   SpO2 94%   BMI 25.57 kg/m?   ?Wt Readings from Last 3 Encounters:  ?01/03/22 144 lb 6 oz (65.5 kg)  ?11/24/21 147 lb 4 oz (66.8 kg)  ?11/19/21 146 lb (66.2 kg)  ?  ?  ?Physical Exam ?Vitals and nursing note reviewed.  ?Constitutional:   ?   Appearance: Normal appearance. She is not ill-appearing.  ?Musculoskeletal:     ?   General: Normal range of motion.  ?   Right lower leg: No edema.  ?   Left lower leg: No edema.  ?   Comments:  ?2+ DP bilaterally ?Sensation intact to light touch ?Diminished sensation to monofilament testing  ?Skin: ?   General: Skin is warm and dry.  ?   Findings: Lesion present. No rash.  ?   Comments: Small hyperkeratotic growth to right upper buttock - easily removed with alcohol pad.   ?Neurological:  ?   Mental Status: She is alert.  ?Psychiatric:     ?   Mood and Affect: Mood normal.     ?   Behavior: Behavior normal.  ? ?   ? ?Lab Results  ?Component Value Date  ? TSH 2.08 01/08/2021  ? ?Lab Results  ?Component Value Date  ? CREATININE 0.88 06/09/2021  ? BUN 17 06/09/2021  ? NA 139 06/09/2021  ? K  4.0 06/09/2021  ? CL 99 06/09/2021  ? CO2 33 (H) 06/09/2021  ?  ?Lab Results  ?Component  Value Date  ? WBC 5.8 08/06/2021  ? HGB 13.3 08/06/2021  ? HCT 38.6 08/06/2021  ? MCV 95 08/06/2021  ? PLT 133 (L) 08/06/2021  ? Last vitamin B12 and Folate ?Lab Results  ?Component Value Date  ? HUTMLYYT03 614 06/09/2021  ? FOLATE 9.6 09/21/2011  ? ?Assessment & Plan:  ?This visit occurred during the SARS-CoV-2 public health emergency.  Safety protocols were in place, including screening questions prior to the visit, additional usage of staff PPE, and extensive cleaning of exam room while observing appropriate contact time as indicated for disinfecting solutions.  ? ?Problem List Items Addressed This Visit   ? ? 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  Now on eliquis in place of coumadin, doing well.  ?  ?  ? Chronic insomnia  ?  Has done well off temazepam, tolerating trazodone.  ?This was stopped due to concern over effect on memory.  ?  ?  ? Vitamin B12 deficiency  ?  She continues to take B12 575mg QOD.  ?Update levels.  ?  ?  ? Dyspnea on exertion  ?  Handicap placard application filled out for patient.  ?Will fill out USPS form if necessary.  ?  ?  ? Anticoagulant long-term use  ?  Now on eliquis.  ?  ?  ? Ascending aortic aneurysm  ?  Cardiology aware.  ?  ?  ? Skin lesion  ?  R upper buttock - SK that was falling off - easily removed.  ?  ?  ? Peripheral neuropathy - Primary  ?  No h/o DM or significant alcohol use.  ?Check labs for reversible causes (vitamin deficiency, thyroid disease, etc) ?  ?  ? Relevant Orders  ? Basic metabolic panel  ? TSH  ? Vitamin B12  ? Folate  ? CBC with Differential/Platelet  ?  ? ?No orders of the defined types were placed in this encounter. ? ?Orders Placed This Encounter  ?Procedures  ? Basic metabolic panel  ? TSH  ? Vitamin B12  ? Folate  ? CBC with Differential/Platelet  ? ? ? ?Patient Instructions  ?Labs today to check other causes of neuropathy  ?Handicap placard  application provided today .  ?Good to see you today.  ?Return as needed or in 6 months for physical.  ? ?Follow up plan: ?Return in about 6 months (around 07/06/2022) for annual exam, prior fasting for blood work, mTourist information centre manager

## 2022-01-03 NOTE — Assessment & Plan Note (Signed)
R upper buttock - SK that was falling off - easily removed.  ?

## 2022-01-04 LAB — VITAMIN B12: Vitamin B-12: 1389 pg/mL — ABNORMAL HIGH (ref 211–911)

## 2022-01-04 LAB — FOLATE: Folate: 10.3 ng/mL (ref >5.9)

## 2022-01-04 LAB — TSH: TSH: 1.18 u[IU]/mL (ref 0.35–5.50)

## 2022-01-05 ENCOUNTER — Other Ambulatory Visit: Payer: Self-pay | Admitting: Family Medicine

## 2022-01-05 MED ORDER — VITAMIN B-12 500 MCG PO TABS
500.0000 ug | ORAL_TABLET | ORAL | Status: DC
Start: 2022-01-06 — End: 2024-03-19

## 2022-01-06 ENCOUNTER — Encounter: Payer: Self-pay | Admitting: Family Medicine

## 2022-01-06 ENCOUNTER — Telehealth: Payer: Self-pay | Admitting: Cardiovascular Disease

## 2022-01-06 MED ORDER — LOVASTATIN 40 MG PO TABS: ORAL_TABLET | ORAL | 1 refills | Status: DC

## 2022-01-06 NOTE — Telephone Encounter (Signed)
E-scribed refill. Notified pt.  ?

## 2022-01-06 NOTE — Telephone Encounter (Signed)
? ?  Pre-operative Risk Assessment  ?  ?Patient Name: [name]  ?DOB: 01/28/1946 ?MRN: [id number]  ? ?  ? ?Request for Surgical Clearance   ? ?Procedure:  Periferal nerve evaluation  ? ?Date of Surgery:  Clearance 01/28/22                              ?   ?Surgeon:  Dr. Matilde Sprang  ?Surgeon's Group or Practice Name:  [facility] ?Phone number:  [phone] ?Fax number:  [phone] ?  ?Type of Clearance Requested:   ?- Medical  ?- Pharmacy:  Hold Apixaban (Eliquis) 2 days prior ?  ?Type of Anesthesia:  IV sedation  ?  ?Additional requests/questions:   ? ?Signed, ?Selena Zobro   ?01/06/2022, 2:34 PM   ?

## 2022-01-07 ENCOUNTER — Telehealth: Payer: Self-pay | Admitting: *Deleted

## 2022-01-07 NOTE — Telephone Encounter (Signed)
Preoperative team, please contact this patient and set up a phone call appointment for further cardiac evaluation.  Thank you for your help. ? ?[name]. Markia Kyer NP-C ? ?  ?01/07/2022, 8:05 AM ?[place] ?[address] ?Office [phone] Fax [phone] ? ?

## 2022-01-07 NOTE — Telephone Encounter (Signed)
Pt agreeable to plan of care for tele pre op appt 01/10/22 @ 10 am. Med rec and consent are done.  ?  ?

## 2022-01-07 NOTE — Telephone Encounter (Signed)
Patient with diagnosis of afib on Eliquis for anticoagulation.   ? ?Procedure: peripheral nerve evaluation ?Date of procedure: 01/28/22 ? ?CHA2DS2-VASc Score = 6  ?This indicates a 9.7% annual risk of stroke. ?The patient's score is based upon: ?CHF History: 1 ?HTN History: 1 ?Diabetes History: 0 ?Stroke History: 0 ?Vascular Disease History: 1 ?Age Score: 2 ?Gender Score: 1 ?  ?CrCl 4m/min ?Platelet count 94K ? ?Per office protocol, patient can hold Eliquis for 2 days prior to procedure as requested.   ?

## 2022-01-07 NOTE — Telephone Encounter (Signed)
Pt agreeable to plan of care for tele pre op appt 01/10/22 @ 10 am. Med rec and consent are done.  ? ?  ?Patient Consent for Virtual Visit  ? ? ?   ? ?[name] has provided verbal consent on 01/07/2022 for a virtual visit (video or telephone). ? ? ?CONSENT FOR VIRTUAL VISIT FOR:  [name]  ?By participating in this virtual visit I agree to the following: ? ?I hereby voluntarily request, consent and authorize [facility] and its employed or contracted physicians, physician assistants, nurse practitioners or other licensed health care professionals (the Practitioner), to provide me with telemedicine health care services (the ?Services") as deemed necessary by the treating Practitioner. I acknowledge and consent to receive the Services by the Practitioner via telemedicine. I understand that the telemedicine visit will involve communicating with the Practitioner through live audiovisual communication technology and the disclosure of certain medical information by electronic transmission. I acknowledge that I have been given the opportunity to request an in-person assessment or other available alternative prior to the telemedicine visit and am voluntarily participating in the telemedicine visit. ? ?I understand that I have the right to withhold or withdraw my consent to the use of telemedicine in the course of my care at any time, without affecting my right to future care or treatment, and that the Practitioner or I may terminate the telemedicine visit at any time. I understand that I have the right to inspect all information obtained and/or recorded in the course of the telemedicine visit and may receive copies of available information for a reasonable fee.  I understand that some of the potential risks of receiving the Services via telemedicine include:  ?Delay or interruption in medical evaluation due to technological equipment failure or disruption; ?Information transmitted may not be  sufficient (e.g. poor resolution of images) to allow for appropriate medical decision making by the Practitioner; and/or  ?In rare instances, security protocols could fail, causing a breach of personal health information. ? ?Furthermore, I acknowledge that it is my responsibility to provide information about my medical history, conditions and care that is complete and accurate to the best of my ability. I acknowledge that Practitioner's advice, recommendations, and/or decision may be based on factors not within their control, such as incomplete or inaccurate data provided by me or distortions of diagnostic images or specimens that may result from electronic transmissions. I understand that the practice of medicine is not an exact science and that Practitioner makes no warranties or guarantees regarding treatment outcomes. I acknowledge that a copy of this consent can be made available to me via my patient portal ([facility]), or I can request a printed copy by calling the office of [facility].   ? ?I understand that my insurance will be billed for this visit.  ? ?I have read or had this consent read to me. ?I understand the contents of this consent, which adequately explains the benefits and risks of the Services being provided via telemedicine.  ?I have been provided ample opportunity to ask questions regarding this consent and the Services and have had my questions answered to my satisfaction. ?I give my informed consent for the services to be provided through the use of telemedicine in my medical care ? ? ? ?

## 2022-01-10 ENCOUNTER — Ambulatory Visit (INDEPENDENT_AMBULATORY_CARE_PROVIDER_SITE_OTHER): Payer: Medicare Other | Admitting: Physician Assistant

## 2022-01-10 DIAGNOSIS — Z01810 Encounter for preprocedural cardiovascular examination: Secondary | ICD-10-CM

## 2022-01-10 NOTE — Progress Notes (Signed)
? ?Virtual Visit via Telephone Note  ? ?This visit type was conducted due to national recommendations for restrictions regarding the COVID-19 Pandemic (e.g. social distancing) in an effort to limit this patient's exposure and mitigate transmission in our community.  Due to her co-morbid illnesses, this patient is at least at moderate risk for complications without adequate follow up.  This format is felt to be most appropriate for this patient at this time.  The patient did not have access to video technology/had technical difficulties with video requiring transitioning to audio format only (telephone).  All issues noted in this document were discussed and addressed.  No physical exam could be performed with this format.  Please refer to the patient's chart for her  consent to telehealth for [facility]. ? ?Evaluation Performed:  Preoperative cardiovascular risk assessment ?_____________  ? ?Date:  01/10/2022  ? ?Patient ID:  Yonna Alwin, [place] 09-10-1946, MRN [id number] ?Patient Location:  ?Home ?Provider location:   ?Office ? ?Primary Care Provider:  Ria Bush, MD ?Primary Cardiologist:  Sanda Klein, MD ? ?Chief Complaint  ?  ?76 y.o. 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 ? ?Past Medical History  ?  ?Past Medical History:  ?Diagnosis Date  ? Acute cystitis without hematuria 12/22/2015  ? Anemia   ? Arthritis   ? CAP (community acquired pneumonia) 02/08/2016  ? Cataracts, bilateral   ? immature  ? Chronic cholecystitis with calculus 01/21/2015  ? S/p cholecystectomy   ? Chronic diastolic heart failure ([place]) 09/19/2014  ? Chronic insomnia   ? Constipation   ? takes Miralax  daily as needed  ? COPD (chronic obstructive pulmonary disease) ([place])   ? Albuterol inhaler daily as needed;Duoneb daily as needed;Spiriva daily  ? DDD (degenerative disc disease)   ? cervical - kyphosis with mod DD changes C5/6 and C6/7; lumbar - early DD at L2/3 ([place])  ? Dyspnea   ? with exertion  ? Essential hypertension   ? was on meds but after appointment Dec 11 with Dr.C he took her off  ? GERD (gastroesophageal reflux disease)   ? takes Protonix daily  ? History of pneumonia 02/16/2016  ? History of radiation therapy 05/30/11 to 07/07/11  ? rectum  ? History of shingles   ? Hyperlipemia   ? takes Lovastatin daily  ? Legally blind in left eye, as defined in [place]   ? Lung nodule   ? RLL nodule-24m stable 2006, April 2009, and June 2009  ? Obesity   ? Osteopenia 12/2014  ? T -1.5 hip  ? Pancreatitis 11/2014  ? ?zpack related vs gallstone pancreatitis with abnormal HIDA scan pending cholecystectomy  ? PONV (postoperative nausea and vomiting)   ? Presence of permanent cardiac pacemaker   ? Rectal cancer ([facility] 04/2011  ? T3N0; s/p lap LAR, s/p ileostomy, s/p reversal, s/p chemo  ? Research study patient 08/26/2016  ? Rheumatic heart disease mitral stenosis   ? mod MS by echo 02/2014  ? S/P AVR (aortic valve replacement) 2015  ? bioprosthetic ([place])  ? S/P MVR (mitral valve replacement) 2015  ? bioprosthetic ([place])  ? Seizures ([facility]   ? febrile seizures as a child  ? Sepsis secondary to UTI ([facility] 08/22/2017  ?  Severe aortic valve stenosis  AVR 3/15 10/01/2013  ? mild-mod by echo 02/2014  ? Sleep apnea   ? PT TOLD BORDERLINE-TRIED CPAP-DID NOT HELP-SHE DOES NOT USE CPAP  ? Small bowel obstruction, partial ([place]) 08/2013  ? reolved without NGT placement.   ? Vitamin B12 deficiency 02/28/2015  ? Start B12 shots 02/2015   ? ?Past Surgical History:  ?Procedure Laterality Date  ? ANTERIOR LAT LUMBAR FUSION Left 07/17/2018  ? Procedure: Lumbar Two-Three Lumbar Three-Four Anterolateral decompression/interbody fusion with  lateral plate fixation/Infuse;  Surgeon: Kristeen Miss, MD;  Location: [facility];  Service: Neurosurgery;  Laterality: Left;  Lumbar Two-Three Lumbar Three-Four Anterolateral decompression/interbody fusion with lateral plate fixation/Infuse  ? AORTIC VALVE REPLACEMENT N/A 12/12/2013  ? Procedure: AORTIC VALVE REPLACEMENT (AVR);  Surgeon: Gaye Pollack, MD; Service: Open Heart Surgery  ? BACK SURGERY  2006, 2007  ? 2006 SPACER, 2007 decompression and fusion L4/5  ? BOWEL RESECTION  04/02/2012  ? Procedure: SMALL BOWEL RESECTION;  Surgeon: Stark Klein, MD;  Location: [facility];  Service: General;  Laterality: N/A;  ? BRONCHIAL WASHINGS  03/24/2021  ? Procedure: BRONCHIAL WASHINGS;  Surgeon: Brand Males, MD;  Location: [facility];  Service: Endoscopy;;  ? CARPAL TUNNEL RELEASE Bilateral   ? CATARACT EXTRACTION W/ INTRAOCULAR LENS IMPLANT Left 10/18/2016  ? CATARACT EXTRACTION W/ INTRAOCULAR LENS IMPLANT Right 12/13/2016  ? Dr. Kathrin Penner  ? CHOLECYSTECTOMY N/A 01/21/2015  ? chronic cholecystitis, Stark Klein, MD  ? COLON RESECTION  08/25/2011  ? Procedure: COLON RESECTION LAPAROSCOPIC;  Surgeon: Stark Klein, MD;  Location: [facility];  Service: General;  Laterality: N/A;  Laparoscopic Assisted Low Anterior Resection Diverting Ostomy and onQ pain pump  ? COLONOSCOPY  03/2013  ? 1 polyp, rpt 3 yrs Ardis Hughs)  ? COLONOSCOPY  05/2021  ? diverticulosis and patent colon anastomosis with some edema/narrowing, rpt 5 yrs Ardis Hughs)  ? COLONOSCOPY WITH PROPOFOL N/A 06/09/2016  ? patent colo-colonic anastomosis, rpt 5 yrs Ardis Hughs)  ? EP IMPLANTABLE DEVICE N/A 06/16/2016  ? Procedure: Pacemaker Implant;  Surgeon: Will Meredith Leeds, MD;  Location: [facility];  Service: Cardiovascular;  Laterality: N/A;  ? FOOT SURGERY  left foot  ? hammer toe  ? ILEOSTOMY  08/25/2011  ? ILEOSTOMY CLOSURE  04/02/2012  ? Procedure: ILEOSTOMY TAKEDOWN;  Surgeon: Stark Klein, MD;  [facility];  Service: General;  Laterality: N/A;  ?  INTRAOPERATIVE TRANSESOPHAGEAL ECHOCARDIOGRAM N/A 12/12/2013  ? Procedure: INTRAOPERATIVE TRANSESOPHAGEAL ECHOCARDIOGRAM;  Surgeon: Gaye Pollack, MD;  [facility];  Service: Open Heart Surgery;  Laterality: N/A;  ? LEFT AND RIGHT HEART CATHETERIZATION WITH CORONARY ANGIOGRAM N/A 11/27/2013  ? Procedure: LEFT AND RIGHT HEART CATHETERIZATION WITH CORONARY ANGIOGRAM;  Surgeon: Sanda Klein, MD;  Location: [facility];  Service: Cardiovascular;  Laterality: N/A;  ? MITRAL VALVE REPLACEMENT N/A 12/12/2013  ? Procedure: MITRAL VALVE (MV) REPLACEMENT OR REPAIR;  Surgeon: Gaye Pollack, MD; Service: Open Heart Surgery  ? TEE WITHOUT CARDIOVERSION N/A 11/29/2013  ? Procedure: TRANSESOPHAGEAL ECHOCARDIOGRAM (TEE);  Surgeon: Sueanne Margarita, MD;  Location: [facility];  Service: Cardiovascular;  Laterality: N/A;  ? TEE WITHOUT CARDIOVERSION N/A 06/04/2019  ? Procedure: TRANSESOPHAGEAL ECHOCARDIOGRAM (TEE);  Surgeon: Sanda Klein, MD;  Location: [facility];  Service: Cardiovascular;  Laterality: N/A;  ? TONSILLECTOMY    ? VIDEO BRONCHOSCOPY N/A 03/24/2021  ? Procedure: VIDEO BRONCHOSCOPY WITHOUT FLUORO;  Surgeon: Brand Males, MD;  [facility];  Service: Endoscopy;  Laterality: N/A;  SMALL SCOPE,PREMEDICATE  WITH NEBULIZED LIDOCAINE  ? ? ?Allergies ? ?Allergies  ?Allergen Reactions  ? Codeine Nausea Only  ? Sulfa Antibiotics Nausea Only  ? Sulfonamide Derivatives Nausea Only  ? ? ?History of Present Illness  ?  ?Gertude Kyarah Enamorado is a 76 y.o. female who presents via audio/video conferencing for a telehealth visit today.  Pt was last seen in cardiology clinic on 10/18/2021 by Dr. Sallyanne Kuster.  At that time Asusena Ryana Montecalvo was doing well.  The patient is now pending peripheral nerve study.  Since her last visit, she has been doing well without exertional chest pain or worsening shortness of breath.  Functional ability mainly limited by back pain. ? ? ?Home Medications  ?  ?Prior to Admission  medications   ?Medication Sig Start Date End Date Taking? Authorizing Provider  ?acetaminophen (TYLENOL) 500 MG tablet Take 2 tablets (1,000 mg total) by mouth daily. 05/02/19   Ria Bush, MD  ?albuterol

## 2022-01-20 ENCOUNTER — Ambulatory Visit (INDEPENDENT_AMBULATORY_CARE_PROVIDER_SITE_OTHER)
Admission: RE | Admit: 2022-01-20 | Discharge: 2022-01-20 | Disposition: A | Discharge status: Home / Self Care | Payer: Medicare Other | Source: Ambulatory Visit | Attending: Internal Medicine | Admitting: Internal Medicine

## 2022-01-20 DIAGNOSIS — J449 Chronic obstructive pulmonary disease, unspecified: Secondary | ICD-10-CM

## 2022-01-21 ENCOUNTER — Encounter: Payer: Self-pay | Admitting: Internal Medicine

## 2022-01-21 ENCOUNTER — Telehealth: Payer: Self-pay

## 2022-01-21 DIAGNOSIS — R918 Other nonspecific abnormal finding of lung field: Secondary | ICD-10-CM

## 2022-01-21 MED ORDER — TRAMADOL HCL 50 MG PO TABS: ORAL_TABLET | ORAL | 0 refills | Status: DC

## 2022-01-21 NOTE — Telephone Encounter (Signed)
Received the following message from patient:  ? ?"Since I don?t have an appointment with you until May 8th, please interpret the test results for me. Is everything still ok or should I be frightened?   ?  ?Thanks for your time and reply."  ? ?MR, can you please advise on her CT scan results? Thanks!  ?

## 2022-01-21 NOTE — Telephone Encounter (Signed)
?  tHis does NOT look like classic cancer. Done 1 bronchw ith wash June 2022 and she grew psudomonas. AT this poitn, she might need a biopsy. She can wait to discuss with me OR we can make areferal to DR Kipp Brood or Der Roxan Hockey to see what they feel about biiosy .  ? ? ? ?CT Chest Wo Contrast ? ?Result Date: 01/21/2022 ?CLINICAL DATA:  Pulmonary nodules. COPD. Bronchitis. Personal history of rectal cancer in 2012. EXAM: CT CHEST WITHOUT CONTRAST TECHNIQUE: Multidetector CT imaging of the chest was performed following the standard protocol without IV contrast. RADIATION DOSE REDUCTION: This exam was performed according to the departmental dose-optimization program which includes automated exposure control, adjustment of the mA and/or kV according to patient size and/or use of iterative reconstruction technique. COMPARISON:  08/16/2021 FINDINGS: Cardiovascular: Dual lead pacer noted. Coronary, aortic arch, and branch vessel atherosclerotic vascular disease. Marked mitral valve calcification. Substantial aortic valve calcification. Prosthetic mitral and aortic valves. Mild cardiomegaly. Ascending aortic aneurysm 4.2 cm in diameter on image 83 series 2, stable. There is also a prominent main pulmonary artery suggesting pulmonary arterial hypertension. Mediastinum/Nodes: Lower right paratracheal lymph node 1.1 cm in short axis on image 64 series 2 (mildly enlarged), formerly 0.9 cm. Lungs/Pleura: Trace right pleural effusion similar to prior. Right greater than left apical pleuroparenchymal scarring. Similar appearance of confluent bandlike density anteriorly in the right upper lobe on image 55 series 3. Scattered tree-in-bud nodularity anteriorly in both upper lobes favoring atypical infectious bronchiolitis. New 5 by 4 mm right middle lobe nodule on image 99 series 3. New clustered bandlike nodularity inferiorly in the right middle lobe, image 118 series 3. New bandlike densities and nodularity in both lower lobes  along the diaphragms as shown on image 118 series 3. Most of this has a complex bandlike confluent morphology although there are some smaller areas of nodularity. A 7 by 5 mm right lower lobe nodule on image 121 of series 3 was previously 5 by 5 mm. Continued bandlike scarring in the lingula. New branching nodularity in the superior segment left lower lobe. Cylindrical bronchiectasis in the right lower lobe. Upper Abdomen: Abdominal aortic and systemic atherosclerotic calcification. Clips along the gallbladder fossa suggest prior cholecystectomy. Musculoskeletal: Right rib deformities from old healed rib fractures. Thoracic spondylosis with multilevel bridging spurring. Chronic anterior wedge compression fracture at L1 especially along the inferior endplate. IMPRESSION: 1. Generally worsened scattered nodularity and bandlike opacities in the lungs superimposed on a background of scattered tree-in-bud reticulonodular opacities. 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 In this setting of scattered nodularity, high specificity with regard to malignant versus infectious etiology of any individual nodule is not feasible on noncontrast CT. Surveillance is suggested. 2. Marked atherosclerosis. 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 Mild cardiomegaly. 3. Ascending thoracic aortic aneurysm 4.2 cm in diameter, stable. Recommend annual imaging followup by CTA or MRA. This recommendation follows 2010 ACCF/AHA/AATS/ACR/ASA/SCA/SCAI/SIR/STS/SVM Guidelines for the Diagnosis and Management of Patients with Thoracic Aortic Disease. Circulation. 2010; 121: H474-Q595. Aortic aneurysm NOS (ICD10-I71.9) 4. Stable trace right pleural effusion. 5. Prominent main pulmonary artery compatible pulmonary arterial hypertension. 6. Cylindrical bronchiectasis in the right lower lobe. Electronically Signed   By: Van Clines [name]   On: 01/21/2022  08:41   ? ?

## 2022-01-21 NOTE — Telephone Encounter (Signed)
Name of Medication: Tramadol  ?Name of Pharmacy: [facility] [address] ?Last Fill or Written Date and Quantity: 12/22/21, #40 ?Last Office Visit and Type: 01/03/22, f/u ?Next Office Visit and Type: 06/29/22, CPE ?Last Controlled Substance Agreement Date: NONE ?Last UDS: none ? ? ?

## 2022-01-21 NOTE — Telephone Encounter (Signed)
ERx 

## 2022-01-21 NOTE — Addendum Note (Signed)
Addended by: Ria Bush on: 01/21/2022 05:19 PM ? ? Modules accepted: Orders ? ?

## 2022-01-27 ENCOUNTER — Ambulatory Visit (HOSPITAL_COMMUNITY)
Admission: RE | Admit: 2022-01-27 | Discharge: 2022-01-27 | Disposition: A | Discharge status: Home / Self Care | Payer: Medicare Other | Source: Ambulatory Visit | Attending: Neurology | Admitting: Neurology

## 2022-01-27 DIAGNOSIS — R413 Other amnesia: Secondary | ICD-10-CM | POA: Diagnosis present

## 2022-01-27 DIAGNOSIS — H547 Unspecified visual loss: Secondary | ICD-10-CM | POA: Diagnosis present

## 2022-01-27 LAB — CUP PACEART REMOTE DEVICE CHECK
Battery Remaining Longevity: 42 mo
Battery Remaining Percentage: 41 %
Battery Voltage: 2.95 V
Brady Statistic AP VP Percent: 57 %
Brady Statistic AP VS Percent: 2.2 %
Brady Statistic AS VP Percent: 40 %
Brady Statistic AS VS Percent: 1 %
Brady Statistic RA Percent Paced: 59 %
Brady Statistic RV Percent Paced: 97 %
Date Time Interrogation Session: 20230420112234
Implantable Lead Implant Date: 20170907
Implantable Lead Implant Date: 20170907
Implantable Lead Location: 753859
Implantable Lead Location: 753860
Implantable Pulse Generator Implant Date: 20170907
Lead Channel Impedance Value: 400 Ohm
Lead Channel Impedance Value: 530 Ohm
Lead Channel Pacing Threshold Amplitude: 0.625 V
Lead Channel Pacing Threshold Amplitude: 0.75 V
Lead Channel Pacing Threshold Pulse Width: 0.5 ms
Lead Channel Pacing Threshold Pulse Width: 0.5 ms
Lead Channel Sensing Intrinsic Amplitude: 3.4 mV
Lead Channel Sensing Intrinsic Amplitude: 6.8 mV
Lead Channel Setting Pacing Amplitude: 0.875
Lead Channel Setting Pacing Amplitude: 2 V
Lead Channel Setting Pacing Pulse Width: 0.5 ms
Lead Channel Setting Sensing Sensitivity: 2 mV
Pulse Gen Model: 2272
Pulse Gen Serial Number: 7945283

## 2022-01-27 NOTE — Progress Notes (Signed)
Per order, Place device in MRI Mode.  ? ?Tachy therapies off if applicable.  ? ?Will send transmission post scan. ?

## 2022-01-28 ENCOUNTER — Ambulatory Visit (INDEPENDENT_AMBULATORY_CARE_PROVIDER_SITE_OTHER): Payer: Medicare Other

## 2022-01-28 DIAGNOSIS — I442 Atrioventricular block, complete: Secondary | ICD-10-CM

## 2022-01-31 ENCOUNTER — Telehealth: Payer: Self-pay | Admitting: Neurology

## 2022-01-31 NOTE — Progress Notes (Signed)
Patient advise of her MRI Results.

## 2022-01-31 NOTE — Telephone Encounter (Signed)
Patient called for MRI results from 01/27/22. She said she has seen them on MyChart but does not understand them. ?

## 2022-01-31 NOTE — Telephone Encounter (Signed)
Patient advised of her Results.

## 2022-02-14 ENCOUNTER — Ambulatory Visit (INDEPENDENT_AMBULATORY_CARE_PROVIDER_SITE_OTHER): Payer: Medicare Other | Admitting: Internal Medicine

## 2022-02-14 ENCOUNTER — Encounter: Payer: Self-pay | Admitting: Internal Medicine

## 2022-02-14 VITALS — BP 122/70 | HR 91 | Temp 98.8°F | Ht 63.0 in | Wt 146.8 lb

## 2022-02-14 DIAGNOSIS — I712 Thoracic aortic aneurysm, without rupture, unspecified: Secondary | ICD-10-CM | POA: Diagnosis not present

## 2022-02-14 DIAGNOSIS — J449 Chronic obstructive pulmonary disease, unspecified: Secondary | ICD-10-CM | POA: Diagnosis not present

## 2022-02-14 DIAGNOSIS — R918 Other nonspecific abnormal finding of lung field: Secondary | ICD-10-CM | POA: Diagnosis not present

## 2022-02-14 DIAGNOSIS — Z2239 Carrier of other specified bacterial diseases: Secondary | ICD-10-CM

## 2022-02-14 NOTE — Progress Notes (Signed)
? ? ? ?PCP Ria Bush, MD ? ?HPI ? ? ? ?OV 08/02/2017 ? ?Chief Complaint  ?Patient presents with  ? Follow-up  ?  Pt states that she got bronchitis x1 month ago. Has been on two rounds of abx and two rounds of steroids. States that it is not in her chest anymore but is still having drainage, sore throat, dry cough, occ. SOB. Denies any CP.  ? ? ?Follow-up Gold stage II/3 COPD not on home oxygen but on inhaler therapy. She comes for routine follow-up. Recently she had COPD flareup was treated by primary care. Then I called in antibiotic and repeat prednisone and this again helped her. Nevertheless she is frustrated by postnasal drainage and the resultant cough. She says normally for COPD exacerbations are associated with initial onset of sinus congestion. Then she's always left with a residual postnasal drip but this time it has lingered longer. She says Sudafed helps her but is no longer available over-the-counter. She really wants relief about this critically. COPD cat score is 14. Is also asking me to sign handicap parking sticker ? ? ?OV 10/31/2017 ? ?Chief Complaint  ?Patient presents with  ? Follow-up  ?  In hosp. 11/13-11/15 due to sepsis from UTI. Pt states she is doing good but can't seem to get her energy back. Pt currently has laryngitis and SOB with exertion.  ? ? ?Follow-up Gold stage II/III COPD not on home oxygen but on triple inhaler therapy.  Is a routine follow-up.  Since I last saw her in the middle of November 2018 she got diagnosed with E. coli sepsis and admitted to the hospital.  Since then she has significant amount of fatigue.  The fatigue is not improved much.  She has suspended her maintenance pulmonary rehabilitation but plans to restart it in March 2019.  She has not had any COPD exacerbations.  She is up-to-date with the flu shot.  Overall COPD CAT score is stable but there is an increase in the level of fatigue.  The only new issue that she complains about is some mild  hoarseness in voice going on for several months.  This is present early in the morning when she wakes up and then as the day goes on it is better.  She notices it when she talks a lot of time in the phone after a long period of rest.  It is insidious onset.  It is not progressive.  There are no other associated symptoms. ? ? ?OV 02/05/2018 ? ?Chief Complaint  ?Patient presents with  ? Follow-up  ?  Pt states she saw PCP two weeks ago and had pna. Pt states she is still not over the pna and has no energy, has hoarseness in voice, cough, and has chest tightness.  ? ? ? ?Follow-up gold stage II/III COPD not on home oxygen but on triple inhaler therapy with Singulair ? ?Routine follow-up.  She tells me t of labs from January 22, 2018 hat January 22, 2018 she started feeling chest tightness wheezing and also increase head cold.  She saw her primary care physician Dr. Danise Mina.  Apparently all the blood work was normal except for mild increase in liver enzymes which she says she fluctuates with.  He was initially thinking a sinus congestion but when he did an x-ray of the report came back as a right lower lobe pneumonia [I personally visualized this x-ray and I agree she might not have right lower lobe pneumonia because in 2015 CT  chest she had some chronic scarring at that area].  She was treated with 5-day Levaquin and a Medrol Dosepak but despite this other than subjective fevers are resolving she still has significant cough congestion and chest tightness without any orthopnea proximal nocturnal dyspnea.  COPD CAT score shows significant deterioration.  She is not feeling well.  Of note, she is intentionally losing weight because of weight watchers but she has not increased her fluid intake adequately.  She has quit all diet soda ? ?Show slight increase in liver enzymes and also slight increase in creatinine compared to 6 months ago. ? ? ?OV 03/13/2018 ? ?Chief Complaint  ?Patient presents with  ? Follow-up  ?  Breathing is  improved since last OV. Still reports DOE and occasional cough. Denies chest tightness or wheezing.  ? ? ?[name]  presents for follow-up of her gold stage II /3 of COPD.  On triple inhaler therapy with Spiriva and Brio and also on Singulair ? ? ?Overall in terms of her COPD: She is stable.  She is compliant with her inhalers.  She is not on oxygen therapy.  Her symptoms actually improved.  In the interim she was diagnosed with atrial fibrillation and she is placed on Xarelto.  She is frustrated because she has to be on 1 more extra medication.  She did have pulmonary function test that is documented below because it has been a while since we did one.  It actually shows improvement in the lung function.  She also had a CT scan of the chest because the last one was in 2015.  This shows scattered lung nodules with the largest one 8 mm in the right lower lobe.  The radiologist has described this is nonspecific in appearance.  She heard the result and she has been extremely anxious and upset.  She is worried that her colon cancer has metastasized or she has lung cancer.  Of note, I personally visualized the CT chest and agree with the findings ? ? ?OV 06/27/2018 ? ?Subjective:  ?Patient ID: [name], female , DOB: 09-29-1946 , age 76 y.o. , MRN: [id number] , ADDRESS: [address] ?[place] [id number] ? ? ?06/27/2018 -   ?Chief Complaint  ?Patient presents with  ? Follow-up  ?  Pt states her breathing has been doing good since last visit. States she has been having some BP issues to the point she fainted and cracked some ribs.  Denies any problems with cough or CP.  ? ? ? ?HPI ?[name] 76 y.o. - follow-up for ? ?- COPD: Overall this is stable with inhaler therapy. She has not had her flu shot. She wants to wait 2 weeks because she just had first shot of her shingles vaccine. She is up-to-date with her pneumonia vaccine. In the interim she has lost weight 60 pounds intentionally and this is improved  on Score ? ?Multiple  Lung nodules: She had follow-up CT chest in September 2019 and compared to May 2019: One of the nodules is resolved to 16 mm and stable. She does have some rib fractures subacute and this is because she fell down. She's been having balance issues with the weight loss and orthostatic hypotension. Both of this is being managed by neurosurgery and primary care ? ?Travel advice: She plans to have back surgery for her foot drop after this she plans to recover at her son's home near [place]. Therefore she wants a antibiotic and prednisone taper to  be handy with her in case of a future COPD exacerbation. ? ? ? ?Results for VYLA, PINT (MRN [id number]) as of 03/13/2018 11:58 ? Ref. Range 11/29/2013 16:04 04/01/2016 10:10 03/13/2018 10:36  ?FEV1-Pre Latest Units: L 0.93 1.35 1.42  ?FEV1-%Pred-Pre Latest Units: % 34 55 59  ? ?Results for ANJANI, FEUERBORN (MRN [id number]) as of 03/13/2018 11:58 ? Ref. Range 03/13/2018 10:36  ?DLCO cor Latest Units: ml/min/mmHg 22.70  ?DLCO cor % pred Latest Units: % 84  ? ? ? ?CT chest Sept 4, 2019 ?Lungs/Pleura: Pleural thickening posteriorly in the right hemithorax ?is stable. There is no significant pleural effusion. There is stable ?subpleural scarring posteriorly in the right lower lobe. There is ?central airway thickening with scattered tree-in-bud nodularity in ?both lungs, similar to the most recent prior study. Nodular scarring ?in both upper lobes is stable. The right lower lobe perifissural ?nodule present on the most recent study has resolved. There is a ?stable 6 x 5 mm right lower lobe nodule on image 119/3. No new or ?enlarging pulmonary nodules. ?   ?IMPRESSION: ?1. Generally stable chronic lung disease with pleuroparenchymal ?scarring and scattered nodularity. Most of these nodules have a ?tree-in-bud appearance, likely post infectious or inflammatory. The ?dominant right lower lobe nodule seen on the most recent study has ?resolved. No new or enlarging  nodules. ?2. No acute chest findings. Healing subacute right-sided rib ?fractures. ?3. Extensive Aortic Atherosclerosis (ICD10-I70.0). ?  ?  ?Electronically Signed ?  By: Richardean Sale [name] ?  On: 06/13/2018 14

## 2022-02-14 NOTE — Patient Instructions (Addendum)
Moderate COPD (chronic obstructive pulmonary disease) ([place]) ? ?- stable with minimal symptmns ? ?Plan ? - continue current bronchodilatory regimen and medicines ? 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 ? ?- the CT features April 2023 are worse than Nov 2022 ? - features do not look like covid lung but general worsening of some of the scattered nodules ? - overal features do NOT fit in with cancer but understand your anxiety over this ? ?Plan ?= check IgG, IgA, IgM, IgE, Quantiferon Gold, ANA, DS-DNA, Rheumatoid Factor, CCP ? - check cold agglutinin titers ?- doubt there is something to biopsy or you might be risky for biopsy but can let Dr Roxan Hockey take a look ?- repeat CT chest without contrast in 3 months ? ? ? ?Ascending Thoracic aortic aneurysm without rupture, unspecified part (HCC) 4.2 cm - stable  ? ?Plan ? - ok to visit with Dr Roxan Hockey on this though I Think your cardiologist is keeping an eye on this ? ? ? ? ?Follow-up ?- Return to see Dr. [name] in 3 months or sooner if needed ? ?

## 2022-02-15 ENCOUNTER — Institutional Professional Consult (permissible substitution) (INDEPENDENT_AMBULATORY_CARE_PROVIDER_SITE_OTHER): Payer: Medicare Other | Admitting: Thoracic Surgery (Cardiothoracic Vascular Surgery)

## 2022-02-15 VITALS — BP 121/73 | HR 103 | Resp 20 | Ht 63.0 in | Wt 146.0 lb

## 2022-02-15 DIAGNOSIS — R918 Other nonspecific abnormal finding of lung field: Secondary | ICD-10-CM

## 2022-02-15 NOTE — Progress Notes (Signed)
PCP is Ria Bush, MD ?Referring Provider is Brand Males, MD ? ?Chief Complaint  ?Patient presents with  ? Lung Lesion  ?  Surgical consult, Chest CT 01/20/22 and 08/16/21, MR Brain 01/27/22  ? ? ?HPI: [name] is sent for consultation regarding lung nodules. ? 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 Cyndia Bent, hypertension, hyperlipidemia, chronic diastolic heart failure, degenerative disc disease, arthritis, rectal cancer, and sleep apnea.  She had a history of lung nodules in the past dating back as far as 2006.  Those were followed over time and remained stable.  More recently she had a scan in July 2020 which showed some waxing and waning peribronchovascular nodularity.  In April she had a repeat CT and was very concerned about the radiology report.  She requested a surgical consultation. ? ?She saw Dr. [name] yesterday. ? ?She does have a history of smoking about a pack and half cigarettes for 10 years before quitting in 1976. ?Past Medical History:  ?Diagnosis Date  ? Acute cystitis without hematuria 12/22/2015  ? Anemia   ? Arthritis   ? CAP (community acquired pneumonia) 02/08/2016  ? Cataracts, bilateral   ? immature  ? Chronic cholecystitis with calculus 01/21/2015  ? S/p cholecystectomy   ? Chronic diastolic heart failure ([place]) 09/19/2014  ? Chronic insomnia   ? Constipation   ? takes Miralax daily as needed  ? COPD (chronic obstructive pulmonary disease) ([place])   ? Albuterol inhaler daily as needed;Duoneb daily as needed;Spiriva daily  ? DDD (degenerative disc disease)   ? cervical - kyphosis with mod DD changes C5/6 and C6/7; lumbar - early DD at L2/3 ([place])  ? Dyspnea   ? with exertion  ? Essential hypertension   ? was on meds but after appointment Dec 11 with Dr.C he took her off  ? GERD (gastroesophageal reflux disease)   ? takes Protonix daily  ? History of pneumonia  02/16/2016  ? History of radiation therapy 05/30/11 to 07/07/11  ? rectum  ? History of shingles   ? Hyperlipemia   ? takes Lovastatin daily  ? Legally blind in left eye, as defined in [place]   ? Lung nodule   ? RLL nodule-67m stable 2006, April 2009, and June 2009  ? Obesity   ? Osteopenia 12/2014  ? T -1.5 hip  ? Pancreatitis 11/2014  ? ?zpack related vs gallstone pancreatitis with abnormal HIDA scan pending cholecystectomy  ? PONV (postoperative nausea and vomiting)   ? Presence of permanent cardiac pacemaker   ? [place] 04/[place]  ? T3N0; s/p lap LAR, s/p ileostomy, s/p reversal, s/p chemo  ? Research study patient 08/26/2016  ? Rheumatic heart disease mitral stenosis   ? mod MS by echo 02/2014  ? S/P AVR (aortic valve replacement) 2015  ? bioprosthetic ([place])  ? S/P MVR (mitral valve replacement) 2015  ? bioprosthetic ([place])  ? Seizures ([facility]   ? febrile seizures as a child  ? Sepsis secondary to UTI ([facility] 08/22/2017  ? Severe aortic valve stenosis  AVR 3/15 10/01/2013  ? mild-mod by echo 02/2014  ? Sleep apnea   ? PT TOLD BORDERLINE-TRIED CPAP-DID NOT HELP-SHE DOES NOT USE CPAP  ? Small bowel obstruction, partial ([facility] 08/2013  ? reolved without NGT placement.   ? Vitamin B12 deficiency 02/28/2015  ? Start B12 shots 02/2015   ? ? ?Past Surgical  History:  ?Procedure Laterality Date  ? ANTERIOR LAT LUMBAR FUSION Left 07/17/2018  ? Procedure: Lumbar Two-Three Lumbar Three-Four Anterolateral decompression/interbody fusion with lateral plate fixation/Infuse;  Surgeon: Kristeen Miss, MD;  Location: [facility];  Service: Neurosurgery;  Laterality: Left;  Lumbar Two-Three Lumbar Three-Four Anterolateral decompression/interbody fusion with lateral plate fixation/Infuse  ? AORTIC VALVE REPLACEMENT N/A 12/12/2013  ? Procedure: AORTIC VALVE REPLACEMENT (AVR);  Surgeon: Gaye Pollack, MD; Service: Open Heart Surgery  ? BACK SURGERY  2006, 2007  ? 2006 SPACER, 2007 decompression and fusion L4/5  ? BOWEL RESECTION   04/02/2012  ? Procedure: SMALL BOWEL RESECTION;  Surgeon: Stark Klein, MD;  Location: [facility];  Service: General;  Laterality: N/A;  ? BRONCHIAL WASHINGS  03/24/2021  ? Procedure: BRONCHIAL WASHINGS;  Surgeon: Brand Males, MD;  Location: [facility];  Service: Endoscopy;;  ? CARPAL TUNNEL RELEASE Bilateral   ? CATARACT EXTRACTION W/ INTRAOCULAR LENS IMPLANT Left 10/18/2016  ? CATARACT EXTRACTION W/ INTRAOCULAR LENS IMPLANT Right 12/13/2016  ? Dr. Kathrin Penner  ? CHOLECYSTECTOMY N/A 01/21/2015  ? chronic cholecystitis, Stark Klein, MD  ? COLON RESECTION  08/25/2011  ? Procedure: COLON RESECTION LAPAROSCOPIC;  Surgeon: Stark Klein, MD;  Location: [facility];  Service: General;  Laterality: N/A;  Laparoscopic Assisted Low Anterior Resection Diverting Ostomy and onQ pain pump  ? COLONOSCOPY  03/2013  ? 1 polyp, rpt 3 yrs Ardis Hughs)  ? COLONOSCOPY  05/2021  ? diverticulosis and patent colon anastomosis with some edema/narrowing, rpt 5 yrs Ardis Hughs)  ? COLONOSCOPY WITH PROPOFOL N/A 06/09/2016  ? patent colo-colonic anastomosis, rpt 5 yrs Ardis Hughs)  ? EP IMPLANTABLE DEVICE N/A 06/16/2016  ? Procedure: Pacemaker Implant;  Surgeon: Will Meredith Leeds, MD;  Location: [facility];  Service: Cardiovascular;  Laterality: N/A;  ? FOOT SURGERY  left foot  ? hammer toe  ? ILEOSTOMY  08/25/2011  ? ILEOSTOMY CLOSURE  04/02/2012  ? Procedure: ILEOSTOMY TAKEDOWN;  Surgeon: Stark Klein, MD;  Location: [facility];  Service: General;  Laterality: N/A;  ? INTRAOPERATIVE TRANSESOPHAGEAL ECHOCARDIOGRAM N/A 12/12/2013  ? Procedure: INTRAOPERATIVE TRANSESOPHAGEAL ECHOCARDIOGRAM;  Surgeon: Gaye Pollack, MD;  Location[facility];  Service: Open Heart Surgery;  Laterality: N/A;  ? LEFT AND RIGHT HEART CATHETERIZATION WITH CORONARY ANGIOGRAM N/A 11/27/2013  ? Procedure: LEFT AND RIGHT HEART CATHETERIZATION WITH CORONARY ANGIOGRAM;  Surgeon: Sanda Klein, MD;  Location: [facility];  Service: Cardiovascular;  Laterality: N/A;  ? MITRAL VALVE  REPLACEMENT N/A 12/12/2013  ? Procedure: MITRAL VALVE (MV) REPLACEMENT OR REPAIR;  Surgeon: Gaye Pollack, MD; Service: Open Heart Surgery  ? TEE WITHOUT CARDIOVERSION N/A 11/29/2013  ? Procedure: TRANSESOPHAGEAL ECHOCARDIOGRAM (TEE);  Surgeon: Sueanne Margarita, MD;  Location: [facility];  Service: Cardiovascular;  Laterality: N/A;  ? TEE WITHOUT CARDIOVERSION N/A 06/04/2019  ? Procedure: TRANSESOPHAGEAL ECHOCARDIOGRAM (TEE);  Surgeon: Sanda Klein, MD;  Location: [facility];  Service: Cardiovascular;  Laterality: N/A;  ? TONSILLECTOMY    ? VIDEO BRONCHOSCOPY N/A 03/24/2021  ? Procedure: VIDEO BRONCHOSCOPY WITHOUT FLUORO;  Surgeon: Brand Males, MD;  [facility];  Service: Endoscopy;  Laterality: N/A;  SMALL SCOPE,PREMEDICATE WITH NEBULIZED LIDOCAINE  ? ? ?Family History  ?Problem Relation Age of Onset  ? Heart disease Mother   ? Diabetes Mother   ? Hypertension Mother   ? Stroke Mother   ? Lung cancer Father   ? Breast cancer Maternal Aunt 80  ? Heart attack Maternal Grandfather   ? CAD Son 81  ? Heart  attack Son 70  ? Rectal cancer Neg Hx   ? Stomach cancer Neg Hx   ? Esophageal cancer Neg Hx   ? Colon cancer Neg Hx   ? ? ?Social History ?Social History  ? ?Tobacco Use  ? Smoking status: Former  ?  Packs/day: 1.50  ?  Years: 10.00  ?  Pack years: 15.00  ?  Types: Cigarettes  ?  Quit date: 12/28/1974  ?  Years since quitting: 47.1  ? Smokeless tobacco: Never  ? Tobacco comments:  ?  quit smoking in 1976  ?Vaping Use  ? Vaping Use: Never used  ?Substance Use Topics  ? Alcohol use: Not Currently  ?  Alcohol/week: 2.0 standard drinks  ?  Types: 2 Glasses of wine per week  ?  Comment: very rare  ? Drug use: No  ? ? ?Current Outpatient Medications  ?Medication Sig Dispense Refill  ? acetaminophen (TYLENOL) 500 MG tablet Take 2 tablets (1,000 mg total) by mouth daily.    ? albuterol (VENTOLIN HFA) 108 (90 Base) MCG/ACT inhaler Inhale 2 puffs into the lungs every 6 (six) hours as needed for wheezing.  18 g 6  ? apixaban (ELIQUIS) 5 MG TABS tablet Take 1 tablet (5 mg total) by mouth 2 (two) times daily. 60 tablet 11  ? Biotin 1000 MCG tablet Take 1,000 mcg by mouth daily.    ? calcium carbonate (OSCAL) 150

## 2022-02-15 NOTE — Progress Notes (Signed)
Remote pacemaker transmission.   

## 2022-02-16 LAB — QUANTIFERON-TB GOLD PLUS
Mitogen-NIL: >10 IU/mL
NIL: 0.03 IU/mL
QuantiFERON-TB Gold Plus: NEGATIVE
TB1-NIL: 0 IU/mL
TB2-NIL: 0.01 IU/mL

## 2022-02-17 ENCOUNTER — Telehealth: Payer: Self-pay | Admitting: Internal Medicine

## 2022-02-17 DIAGNOSIS — R768 Other specified abnormal immunological findings in serum: Secondary | ICD-10-CM

## 2022-02-17 LAB — IGG, IGA, IGM
IgG (Immunoglobin G), Serum: 841 mg/dL (ref 600–1540)
IgM, Serum: 103 mg/dL (ref 50–300)
Immunoglobulin A: 447 mg/dL — ABNORMAL HIGH (ref 70–320)

## 2022-02-17 LAB — RHEUMATOID FACTOR: Rheumatoid fact SerPl-aCnc: 29 IU/mL — ABNORMAL HIGH (ref <14)

## 2022-02-17 LAB — ANTI-NUCLEAR AB-TITER (ANA TITER): ANA Titer 1: 1:1280 {titer} — ABNORMAL HIGH

## 2022-02-17 LAB — ANTI-DNA ANTIBODY, DOUBLE-STRANDED: ds DNA Ab: 3 IU/mL

## 2022-02-17 LAB — COLD AGGLUTININ TITER: COLD HEMAGGLUTININS: NOT DETECTED Titer

## 2022-02-17 LAB — ANA: Anti Nuclear Antibody (ANA): POSITIVE — AB

## 2022-02-17 LAB — CYCLIC CITRUL PEPTIDE ANTIBODY, IGG: Cyclic Citrullin Peptide Ab: <16 UNITS

## 2022-02-17 LAB — IGE: IgE (Immunoglobulin E), Serum: 11 kU/L (ref <=114)

## 2022-02-17 NOTE — Telephone Encounter (Signed)
Her ANA test is strongly +1: 1280.  This suggests she could have autoimmune condition.  Sometimes this can also be medication related. ? ?RF - 29  ? ?Quant gold - negatie ? ?Plan ?- refer rheumatology ? ? ? ?Current Outpatient Medications:  ?  acetaminophen (TYLENOL) 500 MG tablet, Take 2 tablets (1,000 mg total) by mouth daily., Disp: , Rfl:  ?  albuterol (VENTOLIN HFA) 108 (90 Base) MCG/ACT inhaler, Inhale 2 puffs into the lungs every 6 (six) hours as needed for wheezing., Disp: 18 g, Rfl: 6 ?  apixaban (ELIQUIS) 5 MG TABS tablet, Take 1 tablet (5 mg total) by mouth 2 (two) times daily., Disp: 60 tablet, Rfl: 11 ?  Biotin 1000 MCG tablet, Take 1,000 mcg by mouth daily., Disp: , Rfl:  ?  calcium carbonate (OSCAL) 1500 (600 Ca) MG TABS tablet, Take 600 mg of elemental calcium by mouth daily., Disp: , Rfl:  ?  carboxymethylcellulose (REFRESH PLUS) 0.5 % SOLN, Place 1 drop into both eyes 3 (three) times daily as needed (dry eyes)., Disp: , Rfl:  ?  cephALEXin (KEFLEX) 250 MG capsule, Take 250 mg by mouth daily., Disp: , Rfl:  ?  Cholecalciferol (VITAMIN D) 50 MCG (2000 UT) tablet, Take 2,000 Units by mouth daily., Disp: , Rfl:  ?  docusate sodium (COLACE) 100 MG capsule, Take 200 mg by mouth every evening., Disp: , Rfl:  ?  Fluticasone-Umeclidin-Vilant (TRELEGY ELLIPTA) 200-62.5-25 MCG/ACT AEPB, INHALE 1 PUFF INTO THE LUNGS DAILY, Disp: 60 each, Rfl: 11 ?  furosemide (LASIX) 40 MG tablet, Take 1 tablet (40 mg total) by mouth daily. May take an extra tablet daily as needed for swelling., Disp: 45 tablet, Rfl: 11 ?  gabapentin (NEURONTIN) 300 MG capsule, Take 300 mg by mouth at bedtime., Disp: , Rfl:  ?  GEMTESA 75 MG TABS, Take 75 mg by mouth daily., Disp: , Rfl:  ?  ipratropium-albuterol (DUONEB) 0.5-2.5 (3) MG/3ML SOLN, Take 3 mLs by nebulization every 6 (six) hours as needed., Disp: 360 mL, Rfl: 0 ?  lovastatin (MEVACOR) 40 MG tablet, TAKE 1 TABLET(40 MG) BY MOUTH AT BEDTIME, Disp: 90 tablet, Rfl: 1 ?  montelukast  (SINGULAIR) 10 MG tablet, TAKE 1 TABLET(10 MG) BY MOUTH AT BEDTIME, Disp: 90 tablet, Rfl: 1 ?  Multiple Vitamins-Minerals (PRESERVISION AREDS 2 PO), Take 2 capsules by mouth every evening. , Disp: , Rfl:  ?  polyethylene glycol (MIRALAX / GLYCOLAX) packet, Take 17 g by mouth daily., Disp: 14 each, Rfl: 0 ?  potassium chloride (KLOR-CON) 10 MEQ tablet, TAKE 2 TABLETS(20 MEQ) BY MOUTH DAILY, Disp: 180 tablet, Rfl: 3 ?  silver sulfADIAZINE (SILVADENE) 1 % cream, Apply 1 application topically daily., Disp: 50 g, Rfl: 1 ?  traMADol (ULTRAM) 50 MG tablet, TAKE 1 TABLET(50 MG) BY MOUTH TWICE DAILY AS NEEDED FOR MODERATE PAIN, Disp: 40 tablet, Rfl: 0 ?  traZODone (DESYREL) 50 MG tablet, Take 0.5-1 tablets (25-50 mg total) by mouth at bedtime., Disp: 30 tablet, Rfl: 6 ?  vitamin B-12 (CYANOCOBALAMIN) 500 MCG tablet, Take 1 tablet (500 mcg total) by mouth 2 (two) times a week., Disp: , Rfl:  ?  Zinc 50 MG CAPS, Take 1 capsule (50 mg total) by mouth daily., Disp: , Rfl: 0 ?No current facility-administered medications for this visit. ? ?Facility-Administered Medications Ordered in Other Visits:  ?  ondansetron (ZOFRAN) 4 mg in sodium chloride 0.9 % 50 mL IVPB, 4 mg, Intravenous, Q6H PRN, Kristeen Miss, MD ? ?

## 2022-02-18 NOTE — Telephone Encounter (Signed)
Pt was made aware of bloodwork in a separate encounter. ?

## 2022-02-18 NOTE — Telephone Encounter (Signed)
Called and spoke with pt letting her know the results of bloodwork and that MR wants to refer her to rheumatology due to +ANA. Pt verbalized understanding. Referral placed. Nothing further needed. ?

## 2022-02-18 NOTE — Telephone Encounter (Signed)
Waiting to get a response from Dr [name] about blood work results!  ?

## 2022-03-03 ENCOUNTER — Telehealth: Payer: Self-pay | Admitting: Cardiovascular Disease

## 2022-03-03 DIAGNOSIS — I700 Atherosclerosis of aorta: Secondary | ICD-10-CM

## 2022-03-03 HISTORY — DX: Atherosclerosis of aorta: I70.0

## 2022-03-03 NOTE — Telephone Encounter (Signed)
   Primary Cardiologist: Sanda Klein, MD  Chart reviewed as part of pre-operative protocol coverage. Given past medical history and time since last visit, based on ACC/AHA guidelines, [place] Tersa Fotopoulos would be at acceptable risk for the planned procedure without further cardiovascular testing.   Patient with diagnosis of afib on Eliquis for anticoagulation.     Procedure: Interstim Implant Date of procedure: 03/15/22   CHA2DS2-VASc Score = 6  This indicates a 9.7% annual risk of stroke. The patient's score is based upon: CHF History: 1 HTN History: 1 Diabetes History: 0 Stroke History: 0 Vascular Disease History: 1 Age Score: 2 Gender Score: 1   CrCl 47m/min Platelet count 94K   Not sure where placement of implant is. If it's close to the spine, recommend holding Eliquis for 3 days prior. If it's placed elsewhere, recommend holding for 2 days prior.  I will route this recommendation to the requesting party via Epic fax function and remove from pre-op pool.  Please call with questions.  JJossie Ng Shron Ozer NP-C    03/03/2022, 2:31 PM [place] [address] Office [phone] [phone]

## 2022-03-03 NOTE — Telephone Encounter (Signed)
   Pre-operative Risk Assessment    Patient Name: Selina Tapper  DOB: [birth year] MRN: [id number]      Request for Surgical Clearance    Procedure:  Interstim Implant  Date of Surgery:  Clearance 03/15/22                                 Surgeon:  Dr. Matilde Sprang Surgeon's Group or Practice Name:  [facility]  Phone number:  [phone] Fax number:  [phone]   Type of Clearance Requested:   - Pharmacy:  Hold Apixaban (Eliquis) TBD by Cardiology    Type of Anesthesia:  MAC   Additional requests/questions:    Crist Infante   03/03/2022, 8:23 AM

## 2022-03-03 NOTE — Telephone Encounter (Signed)
Patient with diagnosis of afib on Eliquis for anticoagulation.    Procedure: Interstim Implant Date of procedure: 03/15/22  CHA2DS2-VASc Score = 6  This indicates a 9.7% annual risk of stroke. The patient's score is based upon: CHF History: 1 HTN History: 1 Diabetes History: 0 Stroke History: 0 Vascular Disease History: 1 Age Score: 2 Gender Score: 1  CrCl 65m/min Platelet count 94K  Not sure where placement of implant is. If it's close to the spine, recommend holding Eliquis for 3 days prior. If it's placed elsewhere, recommend holding for 2 days prior.

## 2022-03-14 ENCOUNTER — Telehealth: Payer: Self-pay

## 2022-03-14 MED ORDER — AMOXICILLIN 500 MG PO CAPS: 2000.0000 mg | ORAL_CAPSULE | Freq: Once | ORAL | 0 refills | Status: DC

## 2022-03-14 NOTE — Addendum Note (Signed)
Addended by: Ria Bush on: 03/14/2022 09:04 AM   Modules accepted: Orders

## 2022-03-14 NOTE — Telephone Encounter (Signed)
Patient is calling in stating she has a dental procedure coming up and is asking if there is any medications she needs to with hold or take prior to it. Asking for a returned call.

## 2022-03-15 MED ORDER — AMOXICILLIN 500 MG PO CAPS: 2000.0000 mg | ORAL_CAPSULE | Freq: Once | ORAL | 0 refills | Status: DC

## 2022-03-15 NOTE — Addendum Note (Signed)
Addended by: Ria Bush on: 03/15/2022 08:03 AM   Modules accepted: Orders

## 2022-03-15 NOTE — Telephone Encounter (Signed)
I've sent in amoxicillin for to take '2000mg'$  x1 prior to dental procedure.  Regarding blood thinner discontinuation, I think ok to proceed with it unless dentist recommends holding, in which case she should contact cardiology who prescribes it.

## 2022-03-15 NOTE — Telephone Encounter (Signed)
Spoke with pt relaying Dr. G's message.  Pt verbalizes understanding and expresses her thanks.  

## 2022-03-19 ENCOUNTER — Other Ambulatory Visit: Payer: Self-pay | Admitting: Family Medicine

## 2022-03-19 DIAGNOSIS — D696 Thrombocytopenia, unspecified: Secondary | ICD-10-CM

## 2022-03-19 NOTE — Addendum Note (Signed)
Addended by: Ria Bush on: 03/19/2022 11:59 AM   Modules accepted: Orders

## 2022-03-20 ENCOUNTER — Other Ambulatory Visit: Payer: Self-pay | Admitting: Family Medicine

## 2022-03-21 ENCOUNTER — Other Ambulatory Visit (INDEPENDENT_AMBULATORY_CARE_PROVIDER_SITE_OTHER): Payer: Medicare Other

## 2022-03-21 DIAGNOSIS — D696 Thrombocytopenia, unspecified: Secondary | ICD-10-CM | POA: Diagnosis not present

## 2022-03-21 DIAGNOSIS — D7589 Other specified diseases of blood and blood-forming organs: Secondary | ICD-10-CM

## 2022-03-21 LAB — CBC WITH DIFFERENTIAL/PLATELET
Basophils Absolute: 0 10*3/uL (ref 0.0–0.1)
Basophils Relative: 1.1 % (ref 0.0–3.0)
Eosinophils Absolute: 0.2 10*3/uL (ref 0.0–0.7)
Eosinophils Relative: 4.2 % (ref 0.0–5.0)
HCT: 38.1 % (ref 36.0–46.0)
Hemoglobin: 12.5 g/dL (ref 12.0–15.0)
Lymphocytes Relative: 10.9 % — ABNORMAL LOW (ref 12.0–46.0)
Lymphs Abs: 0.5 10*3/uL — ABNORMAL LOW (ref 0.7–4.0)
MCHC: 32.7 g/dL (ref 30.0–36.0)
MCV: 102.6 fl — ABNORMAL HIGH (ref 78.0–100.0)
Monocytes Absolute: 0.5 10*3/uL (ref 0.1–1.0)
Monocytes Relative: 10.7 % (ref 3.0–12.0)
Neutro Abs: 3.2 10*3/uL (ref 1.4–7.7)
Neutrophils Relative %: 73.1 % (ref 43.0–77.0)
Platelets: 88 10*3/uL — ABNORMAL LOW (ref 150.0–400.0)
RBC: 3.71 Mil/uL — ABNORMAL LOW (ref 3.87–5.11)
RDW: 14.7 % (ref 11.5–15.5)
WBC: 4.4 10*3/uL (ref 4.0–10.5)

## 2022-03-21 LAB — HEPATIC FUNCTION PANEL
ALT: 49 U/L — ABNORMAL HIGH (ref 0–35)
AST: 25 U/L (ref 0–37)
Albumin: 3.5 g/dL (ref 3.5–5.2)
Alkaline Phosphatase: 123 U/L — ABNORMAL HIGH (ref 39–117)
Bilirubin, Direct: 0.2 mg/dL (ref 0.0–0.3)
Total Bilirubin: 0.6 mg/dL (ref 0.2–1.2)
Total Protein: 6 g/dL (ref 6.0–8.3)

## 2022-03-21 NOTE — Telephone Encounter (Signed)
Name of Medication: Tramadol Name of Pharmacy: [facility] [facility] or Written Date and Quantity: 01/21/22, #40 Last Office Visit and Type: 01/03/22, f/u Next Office Visit and Type: 06/24/22, CPE Last Controlled Substance Agreement Date: none Last UDS: none

## 2022-03-22 ENCOUNTER — Other Ambulatory Visit: Payer: Medicare Other

## 2022-03-22 DIAGNOSIS — D696 Thrombocytopenia, unspecified: Secondary | ICD-10-CM

## 2022-03-22 DIAGNOSIS — D7589 Other specified diseases of blood and blood-forming organs: Secondary | ICD-10-CM

## 2022-03-22 NOTE — Addendum Note (Signed)
Addended by: Ellamae Sia on: 03/22/2022 10:02 AM   Modules accepted: Orders

## 2022-03-23 LAB — PATHOLOGIST SMEAR REVIEW

## 2022-03-23 NOTE — Telephone Encounter (Signed)
eRx

## 2022-03-23 NOTE — Telephone Encounter (Signed)
See 03/20/22 refill note.

## 2022-03-27 ENCOUNTER — Encounter: Payer: Self-pay | Admitting: Family Medicine

## 2022-03-27 DIAGNOSIS — D7589 Other specified diseases of blood and blood-forming organs: Secondary | ICD-10-CM | POA: Insufficient documentation

## 2022-03-27 HISTORY — DX: Other specified diseases of blood and blood-forming organs: D75.89

## 2022-04-04 ENCOUNTER — Encounter: Payer: Self-pay | Admitting: *Deleted

## 2022-04-04 ENCOUNTER — Telehealth: Payer: Self-pay | Admitting: Cardiovascular Disease

## 2022-04-04 ENCOUNTER — Encounter: Payer: Self-pay | Admitting: Cardiovascular Disease

## 2022-04-04 ENCOUNTER — Ambulatory Visit (INDEPENDENT_AMBULATORY_CARE_PROVIDER_SITE_OTHER): Payer: Medicare Other | Admitting: Cardiovascular Disease

## 2022-04-04 VITALS — BP 114/76 | HR 93 | Ht 64.0 in | Wt 147.2 lb

## 2022-04-04 DIAGNOSIS — T82857D Stenosis of cardiac prosthetic devices, implants and grafts, subsequent encounter: Secondary | ICD-10-CM

## 2022-04-04 DIAGNOSIS — I5032 Chronic diastolic (congestive) heart failure: Secondary | ICD-10-CM

## 2022-04-04 DIAGNOSIS — E7800 Pure hypercholesterolemia, unspecified: Secondary | ICD-10-CM

## 2022-04-04 DIAGNOSIS — Z953 Presence of xenogenic heart valve: Secondary | ICD-10-CM

## 2022-04-04 DIAGNOSIS — I4589 Other specified conduction disorders: Secondary | ICD-10-CM

## 2022-04-04 DIAGNOSIS — I442 Atrioventricular block, complete: Secondary | ICD-10-CM

## 2022-04-04 DIAGNOSIS — I495 Sick sinus syndrome: Secondary | ICD-10-CM

## 2022-04-04 DIAGNOSIS — Z87898 Personal history of other specified conditions: Secondary | ICD-10-CM

## 2022-04-04 DIAGNOSIS — I2510 Atherosclerotic heart disease of native coronary artery without angina pectoris: Secondary | ICD-10-CM

## 2022-04-04 DIAGNOSIS — I4819 Other persistent atrial fibrillation: Secondary | ICD-10-CM

## 2022-04-04 DIAGNOSIS — D6869 Other thrombophilia: Secondary | ICD-10-CM

## 2022-04-04 DIAGNOSIS — H53132 Sudden visual loss, left eye: Secondary | ICD-10-CM

## 2022-04-04 DIAGNOSIS — Z950 Presence of cardiac pacemaker: Secondary | ICD-10-CM

## 2022-04-04 DIAGNOSIS — I700 Atherosclerosis of aorta: Secondary | ICD-10-CM

## 2022-04-04 DIAGNOSIS — I7121 Aneurysm of the ascending aorta, without rupture: Secondary | ICD-10-CM

## 2022-04-04 DIAGNOSIS — J449 Chronic obstructive pulmonary disease, unspecified: Secondary | ICD-10-CM

## 2022-04-04 NOTE — Progress Notes (Signed)
Cardiology office note   Date:  04/09/2022   ID:  Ova Gillentine, [place] Sep 22, 1946, MRN [id number]  Patient Location: Home Provider Location: Home  PCP:  Ria Bush, MD  Cardiologist:  Sanda Klein, MD  Electrophysiologist:  None   Evaluation Performed:  Follow-Up Visit  Chief Complaint: CHF, AFib  History of Present Illness:    [name] is a 76 y.o. 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, [employer], 2017), (typical?) atrial flutter (overdrive pacing March 0998).  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  She had a lot of problems with easy bleeding and bruising on warfarin, as well as highly erratic INR, rarely kept in normal range.  In February 2022 we switched from warfarin to Eliquis.  Overall seems to be doing very well, but complains of fatigue (specifically not dyspnea, just feels very tired).  She has not had chest pain and is not aware of any palpitations.  She does not have orthopnea or PND.  She does develop intermittent swelling that is always present only in the right ankle and is never worse than 1-2+.  Has not had to take any extra doses of furosemide in the last month or so (continues to take this roughly once a week).  She has had increased problems with bruising and was mildly thrombocytopenic on her last labs (platelets 94,000).  Continues to have issues with her urinary bladder and has not yet had a bladder stimulator implanted.  Has not had any falls or bleeding problems.  She has not had any more episodes of the transient visual field cuts that occurred several times in the last couple years.  She has had repeated episodes of transient partial visual field  cut in her left eye, lasting for a few minutes at a time.  Usually its the bottom half of her visual field that she loses.  Initial concern that this may be related to thromboembolic events is felt to likely anymore, since the episodes are repetitive and stereotypical always in the same distribution.  Her previous ophthalmological evaluation also "did not show anything wrong".  She remains frustrated with her memory problems.  She has a follow-up with the neuropsychologist on July 14.  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 She had a CT myelogram on May 08 2019.  There was severe left foraminal stenosis at L3-4 as well as moderate left foraminal stenosis at C4-5-6.  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   She had her most recent echocardiogram 10/13/2021.  This continues to show normal left ventricular systolic function and stable aortic valve prosthesis parameters.  The mean mitral gradient is slightly higher at 9.7 mmHg (up from 7 mmHg), but the mitral valve deceleration time is actually little lower at 338 ms (previously 392 ms).  This suggests that the increased gradients may be due to a change in cardiac output/stroke-volume, rather than a true deterioration in mitral valve performance.  Pacemaker interrogation shows normal findings.  The estimated joint longevity is 3.2-3.4 years.  The burden of atrial fibrillation remains very low at less than 1% and is particularly well in the last 3 months.  There are frequent but very brief episodes of atrial tachycardia, nonsustained.  She has 74% atrial  pacing and the heart rate histogram distribution is rather blunted.  She has 96% ventricular pacing.  She has a distinct positive R wave in lead V1 on her ECG.  Her pacemaker is a Micro[employer]ologist implanted in 2017, both leads are [employer] 2088, so her system is MRI conditional.    She has a very mild ascending aortic aneurysm that has been stable in size, most recently 4.2 cm on 08/16/2021.  She  does have scattered aortic atherosclerosis.  On this last scan there appeared to be improvement in the areas of bronchiectasis/groundglass opacities represent MAC infection.    Past Medical History:  Diagnosis Date   Acute cystitis without hematuria 12/22/2015   Anemia    Arthritis    CAP (community acquired pneumonia) 02/08/2016   Cataracts, bilateral    immature   Chronic cholecystitis with calculus 01/21/2015   S/p cholecystectomy    Chronic diastolic heart failure ([place]) 09/19/2014   Chronic insomnia    Constipation    takes Miralax daily as needed   COPD (chronic obstructive pulmonary disease) (HCC)    Albuterol inhaler daily as needed;Duoneb daily as needed;Spiriva daily   DDD (degenerative disc disease)    cervical - kyphosis with mod DD changes C5/6 and C6/7; lumbar - early DD at L2/3 ([place])   Dyspnea    with exertion   Essential hypertension    was on meds but after appointment Dec 11 with Dr.C he took her off   GERD (gastroesophageal reflux disease)    takes Protonix daily   History of pneumonia 02/16/2016   History of radiation therapy 05/30/11 to 07/07/11   rectum   History of shingles    Hyperlipemia    takes Lovastatin daily   Legally blind in left eye, as defined in [place]    Lung nodule    RLL nodule-27m stable 2006, April 2009, and June 2009   Obesity    Osteopenia 12/2014   T -1.5 hip   Pancreatitis 11/2014   ?zpack related vs gallstone pancreatitis with abnormal HIDA scan pending cholecystectomy   PONV (postoperative nausea and vomiting)    Presence of permanent cardiac pacemaker    Rectal cancer ([facility] 04/2011   T3N0; s/p lap LAR, s/p ileostomy, s/p reversal, s/p chemo   Research study patient 08/26/2016   Rheumatic heart disease mitral stenosis    mod MS by echo 02/2014   S/P AVR (aortic valve replacement) 2015   bioprosthetic ([place])   S/P MVR (mitral valve replacement) 2015   bioprosthetic ([place])   Seizures (HCC)    febrile seizures as a child    Sepsis secondary to UTI ([facility] 08/22/2017   Severe aortic valve stenosis  AVR 3/15 10/01/2013   mild-mod by echo 02/2014   Sleep apnea    PT TOLD BORDERLINE-TRIED CPAP-DID NOT HELP-SHE DOES NOT USE CPAP   Small bowel obstruction, partial ([facility] 08/2013   reolved without NGT placement.    Vitamin B12 deficiency 02/28/2015   Start B12 shots 02/2015    Past Surgical History:  Procedure Laterality Date   ANTERIOR LAT LUMBAR FUSION Left 07/17/2018   Procedure: Lumbar Two-Three Lumbar Three-Four Anterolateral decompression/interbody fusion with lateral plate fixation/Infuse;  Surgeon: EKristeen Miss MD;  [facility]  Service: Neurosurgery;  Laterality: Left;  Lumbar Two-Three Lumbar Three-Four Anterolateral decompression/interbody fusion with lateral plate fixation/Infuse   AORTIC VALVE REPLACEMENT N/A 12/12/2013   Procedure: AORTIC VALVE REPLACEMENT (AVR);  Surgeon: BGaye Pollack MD; Service: Open Heart Surgery  BACK SURGERY  2006, 2007   2006 SPACER, 2007 decompression and fusion L4/5   BOWEL RESECTION  04/02/2012   Procedure: SMALL BOWEL RESECTION;  Surgeon: Stark Klein, MD;  Location: [facility];  Service: General;  Laterality: N/A;   BRONCHIAL WASHINGS  03/24/2021   Procedure: BRONCHIAL WASHINGS;  Surgeon: Brand Males, MD;  [facility]: [facility];  Service: Endoscopy;;   CARPAL TUNNEL RELEASE Bilateral    CATARACT EXTRACTION W/ INTRAOCULAR LENS IMPLANT Left 10/18/2016   CATARACT EXTRACTION W/ INTRAOCULAR LENS IMPLANT Right 12/13/2016   Dr. Kathrin Penner   CHOLECYSTECTOMY N/A 01/21/2015   chronic cholecystitis, Stark Klein, MD   COLON RESECTION  08/25/2011   Procedure: COLON RESECTION LAPAROSCOPIC;  Surgeon: Stark Klein, MD;  [facility];  Service: General;  Laterality: N/A;  Laparoscopic Assisted Low Anterior Resection Diverting Ostomy and onQ pain pump   COLONOSCOPY  03/2013   1 polyp, rpt 3 yrs Ardis Hughs)   COLONOSCOPY  05/2021   diverticulosis and patent colon  anastomosis with some edema/narrowing, rpt 5 yrs Ardis Hughs)   COLONOSCOPY WITH PROPOFOL N/A 06/09/2016   patent colo-colonic anastomosis, rpt 5 yrs Ardis Hughs)   EP IMPLANTABLE DEVICE N/A 06/16/2016   Procedure: Pacemaker Implant;  Surgeon: Will Meredith Leeds, MD;  Location: [facility];  Service: Cardiovascular;  Laterality: N/A;   FOOT SURGERY  left foot   hammer toe   ILEOSTOMY  08/25/2011   ILEOSTOMY CLOSURE  04/02/2012   Procedure: ILEOSTOMY TAKEDOWN;  Surgeon: Stark Klein, MD;  [facility];  Service: General;  Laterality: N/A;   INTRAOPERATIVE TRANSESOPHAGEAL ECHOCARDIOGRAM N/A 12/12/2013   Procedure: INTRAOPERATIVE TRANSESOPHAGEAL ECHOCARDIOGRAM;  Surgeon: Gaye Pollack, MD;  Location: [facility];  Service: Open Heart Surgery;  Laterality: N/A;   LEFT AND RIGHT HEART CATHETERIZATION WITH CORONARY ANGIOGRAM N/A 11/27/2013   Procedure: LEFT AND RIGHT HEART CATHETERIZATION WITH CORONARY ANGIOGRAM;  Surgeon: Sanda Klein, MD;  Location: [facility];  Service: Cardiovascular;  Laterality: N/A;   MITRAL VALVE REPLACEMENT N/A 12/12/2013   Procedure: MITRAL VALVE (MV) REPLACEMENT OR REPAIR;  Surgeon: Gaye Pollack, MD; Service: Open Heart Surgery   TEE WITHOUT CARDIOVERSION N/A 11/29/2013   Procedure: TRANSESOPHAGEAL ECHOCARDIOGRAM (TEE);  Surgeon: Sueanne Margarita, MD;  Location: [facility];  Service: Cardiovascular;  Laterality: N/A;   TEE WITHOUT CARDIOVERSION N/A 06/04/2019   Procedure: TRANSESOPHAGEAL ECHOCARDIOGRAM (TEE);  Surgeon: Sanda Klein, MD;  Location: [facility];  Service: Cardiovascular;  Laterality: N/A;   TONSILLECTOMY     VIDEO BRONCHOSCOPY N/A 03/24/2021   Procedure: VIDEO BRONCHOSCOPY WITHOUT FLUORO;  Surgeon: Brand Males, MD;  [facility];  Service: Endoscopy;  Laterality: N/A;  SMALL SCOPE,PREMEDICATE WITH NEBULIZED LIDOCAINE     Current Meds  Medication Sig   acetaminophen (TYLENOL) 500 MG tablet Take 2 tablets (1,000 mg total) by mouth  daily.   apixaban (ELIQUIS) 5 MG TABS tablet Take 1 tablet (5 mg total) by mouth 2 (two) times daily.   Biotin 1000 MCG tablet Take 1,000 mcg by mouth daily.   calcium carbonate (OSCAL) 1500 (600 Ca) MG TABS tablet Take 600 mg of elemental calcium by mouth daily.   carboxymethylcellulose (REFRESH PLUS) 0.5 % SOLN Place 1 drop into both eyes 3 (three) times daily as needed (dry eyes).   cephALEXin (KEFLEX) 250 MG capsule Take 250 mg by mouth daily.   Cholecalciferol (VITAMIN D) 50 MCG (2000 UT) tablet Take 2,000 Units by mouth daily.   docusate sodium (COLACE) 100 MG capsule Take 200 mg by  mouth every evening.   Fluticasone-Umeclidin-Vilant (TRELEGY ELLIPTA) 200-62.5-25 MCG/ACT AEPB INHALE 1 PUFF INTO THE LUNGS DAILY   furosemide (LASIX) 40 MG tablet Take 1 tablet (40 mg total) by mouth daily. May take an extra tablet daily as needed for swelling.   gabapentin (NEURONTIN) 300 MG capsule Take 300 mg by mouth at bedtime.   lovastatin (MEVACOR) 40 MG tablet TAKE 1 TABLET(40 MG) BY MOUTH AT BEDTIME   montelukast (SINGULAIR) 10 MG tablet TAKE 1 TABLET(10 MG) BY MOUTH AT BEDTIME   Multiple Vitamins-Minerals (PRESERVISION AREDS 2 PO) Take 2 capsules by mouth every evening.    potassium chloride (KLOR-CON) 10 MEQ tablet TAKE 2 TABLETS(20 MEQ) BY MOUTH DAILY   traMADol (ULTRAM) 50 MG tablet TAKE 1 TABLET(50 MG) BY MOUTH TWICE DAILY AS NEEDED FOR MODERATE PAIN   traZODone (DESYREL) 50 MG tablet Take 0.5-1 tablets (25-50 mg total) by mouth at bedtime.   vitamin B-12 (CYANOCOBALAMIN) 500 MCG tablet Take 1 tablet (500 mcg total) by mouth 2 (two) times a week.   Zinc 50 MG CAPS Take 1 capsule (50 mg total) by mouth daily.     Allergies:   Codeine, Sulfa antibiotics, and Sulfonamide derivatives   Social History   Tobacco Use   Smoking status: Former    Packs/day: 1.50    Years: 10.00    Total pack years: 15.00    Types: Cigarettes    Quit date: 12/28/1974    Years since quitting: 47.3   Smokeless  tobacco: Never   Tobacco comments:    quit smoking in 1976  Vaping Use   Vaping Use: Never used  Substance Use Topics   Alcohol use: Not Currently    Alcohol/week: 2.0 standard drinks of alcohol    Types: 2 Glasses of wine per week    Comment: very rare   Drug use: No     Family Hx: The patient's family history includes Breast cancer (age of onset: 39) in her maternal aunt; CAD (age of onset: 34) in her son; Diabetes in her mother; Heart attack in her maternal grandfather; Heart attack (age of onset: 69) in her son; Heart disease in her mother; Hypertension in her mother; Lung cancer in her father; Stroke in her mother. There is no history of Rectal cancer, Stomach cancer, Esophageal cancer, or Colon cancer.  ROS:   Please see the history of present illness.     All other systems reviewed and are negative.   Prior CV studies:   The following studies were reviewed today:  Echocardiogram 03/26/2021  1. S/P AVR with mean gradient of 14 mmHg and trace AI; S/P MVR with mean  gradient of 7 mmHg.   2. Left ventricular ejection fraction, by estimation, is 60 to 65%. The  left ventricle has normal function. The left ventricle has no regional  wall motion abnormalities. There is mild left ventricular hypertrophy.  Left ventricular diastolic parameters  are indeterminate. Elevated left atrial pressure.   3. Right ventricular systolic function is normal. The right ventricular  size is mildly enlarged. There is mildly elevated pulmonary artery  systolic pressure.   4. Left atrial size was severely dilated.   5. The mitral valve has been repaired/replaced. No evidence of mitral  valve regurgitation. Mild mitral stenosis. There is a bioprosthetic valve  present in the mitral position.   6. The aortic valve has been repaired/replaced. Aortic valve  regurgitation is trivial. No aortic stenosis is present. There is a  bioprosthetic valve present in  the aortic position.   7. Aortic dilatation  noted. There is mild dilatation of the aortic root,  measuring 43 mm. There is mild dilatation of the ascending aorta,  measuring 43 mm.   8. The inferior vena cava is normal in size with greater than 50%  respiratory variability, suggesting right atrial pressure of 3 mmHg.  MV Peak grad:  17.1 mmHg  MV Mean grad:  7.0 mmHg      MV Vmax:       2.07 m/s      MV Decel Time: 392 msec    Echocardiogram 10/13/2021  1. Left ventricular ejection fraction, by estimation, is 60 to 65%. The  left ventricle has normal function. The left ventricle has no regional  wall motion abnormalities.   2. Right ventricular systolic function is normal. The right ventricular  size is mildly enlarged.   3. Left atrial size was mildly dilated.   4. The mitral valve has been repaired/replaced. No evidence of mitral  valve regurgitation. No evidence of mitral stenosis. The mean mitral valve  gradient is 9.7 mmHg. There is a 25 mm bioprosthetic valve present in the  mitral position.   5. The aortic valve has been repaired/replaced. Aortic valve  regurgitation is not visualized. No aortic stenosis is present. There is a  bioprosthetic valve present in the aortic position. Echo findings are  consistent with normal structure and function  of the aortic valve prosthesis. Aortic valve mean gradient measures 12.6  mmHg. Aortic valve Vmax measures 2.28 m/s.   6. Aortic dilatation noted. There is mild dilatation of the ascending  aorta, measuring 43 mm.   7. The inferior vena cava is normal in size with greater than 50%  respiratory variability, suggesting right atrial pressure of 3 mmHg.   Comparison(s): Prior images reviewed side by side. Prior MV mean gradient  7 mmHg.  MV Area (PHT): 2.24 cm      MV Peak grad:  16.2 mmHg     MV Mean grad:  9.7 mmHg  MV Vmax:       2.01 m/s      MV Vmean:      150.0 cm/s    MV Decel Time: 338 msec  TR Vmax:        268.00 cm/s     Labs/Other Tests and Data Reviewed:    CT angiogram of head and neck 06/18/2021 No evidence of acute brain infarction. Chronic small-vessel ischemic change of the white matter. Old lacunar infarction left caudate head. No intracranial large vessel occlusion or correctable proximal stenosis. Aortic atherosclerosis. Atherosclerotic disease at both carotid artery bifurcations. 30-40% stenosis of the proximal ICA on the right. 30% stenosis of the proximal ICA on the left.  EKG: Electrocardiogram was checked today, personally reviewed, shows atrial paced, ventricular paced rhythm with a rather broad QRS at 204 ms, with positive R wave in lead V1.  QTc 552 ms.  Similar to previous tracings.  On my review of her chest CTs it's highly suspicious that the tip of the right ventricular pacing lead is in the pericardial space.  This would explain the positive R waves in leads V1.  Recent Labs: 01/03/2022: BUN 22; Creatinine, Ser 0.96; Potassium 4.2; Sodium 140; TSH 1.18 03/21/2022: ALT 49; Hemoglobin 12.5; Platelets 88.0 Repeated and verified X2.   Recent Lipid Panel Lab Results  Component Value Date/Time   CHOL 171 06/09/2021 08:14 AM   TRIG 119.0 06/09/2021 08:14 AM   HDL 59.00 06/09/2021 08:14  AM   CHOLHDL 3 06/09/2021 08:14 AM   LDLCALC 88 06/09/2021 08:14 AM   LDLDIRECT 111.0 06/26/2015 09:28 AM    Wt Readings from Last 3 Encounters:  04/04/22 147 lb 3.2 oz (66.8 kg)  02/15/22 146 lb (66.2 kg)  02/14/22 146 lb 12.8 oz (66.6 kg)     Objective:    Vital Signs:  BP 114/76 (BP Location: Left Arm, Patient Position: Sitting, Cuff Size: Normal)   Pulse 93   Ht _0  (1.626 m)   Wt 147 lb 3.2 oz (66.8 kg)   SpO2 91%   BMI 25.27 kg/m      General: Alert, oriented x3, no distress, healthy left subclavian pacemaker site Head: no evidence of trauma, PERRL, EOMI, no exophtalmos or lid lag, no myxedema, no xanthelasma; normal ears, nose and oropharynx Neck: normal jugular venous pulsations and no hepatojugular reflux; brisk  carotid pulses without delay and no carotid bruits Chest: clear to auscultation, no signs of consolidation by percussion or palpation, normal fremitus, symmetrical and full respiratory excursions Cardiovascular: normal position and quality of the apical impulse, regular rhythm, normal first and widely split second heart sounds, 2/6 aortic ejection murmur (rather musical), no diastolic murmurs, rubs or gallops Abdomen: no tenderness or distention, no masses by palpation, no abnormal pulsatility or arterial bruits, normal bowel sounds, no hepatosplenomegaly Extremities: no clubbing, cyanosis, there is 1+ ankle edema on the right; 2+ radial, ulnar and brachial pulses bilaterally; 2+ right femoral, posterior tibial and dorsalis pedis pulses; 2+ left femoral, posterior tibial and dorsalis pedis pulses; no subclavian or femoral bruits Neurological: grossly nonfocal Psych: Normal mood and affect  ASSESSMENT & PLAN:    1. Chronic diastolic heart failure (HCC)   2. Stenosis of prosthetic mitral valve, subsequent encounter   3. History of aortic valve replacement with bioprosthetic valve   4. Third degree AV block ([place])   5. SSS (sick sinus syndrome) (HCC)   6. Chronotropic incompetence   7. Tachycardia-bradycardia syndrome ([place])   8. Pacemaker   9. Persistent atrial fibrillation ([place])   10. Acquired thrombophilia ([place])   11. History of syncope   12. Chronic obstructive pulmonary disease, unspecified COPD type ([place])   13. Acute loss of vision, left   14. Aneurysm of ascending aorta without rupture ([place])   15. Aortic atherosclerosis ([place])   16. Hypercholesterolemia   17. ASCVD (arteriosclerotic cardiovascular disease)   18. Third degree atrioventricular block (HCC)        CHF: Asymptomatic (NYHA functional class I), occasional mild right ankle edema, otherwise no signs of hypervolemia (takes furosemide no more than once weekly).   Previously had decompensation due to mitral bioprosthetic valve  thrombosis and increased mitral stenosis. Prosthetic MV stenosis: Transient, related to thrombotic leaflet restriction.  Mean mitral valve gradients have been stable at around 7-10  mmHg, most recently evaluated in January 2023.  We switched  from warfarin back to Eliquis due to problems with maintaining INR in therapeutic range and easy bleeding problems when INR was high.  There is really no change in her mitral valve gradients on the most recent echocardiogram.  Reevaluate clinically in 6 months and repeat the echo in 12 months. S/p AVR: Normal function of the aortic valve biological prosthesis on recent echo.  Stable gradients.  Aware of the need for endocarditis prophylaxis. 2nd/3rd deg AV block: She has virtually 100% ventricular pacing but this has not been associated with heart failure exacerbation.  An escape rhythm is present at about  40 bpm. SSS-tachy/brady sd: She also has substantially more atrial pacing and heart rate histogram distribution is blunted.  She is not on beta-blockers I wonder whether her fatigue is due to chronotropic incompetence.  Pacemaker: Normal device function virtually 100% ventricular paced.   Made her rate response more aggressive (threshold unchanged, but response time changed from medium to fast downslope from 8 to 10).  Reevaluate her heart rate histogram and symptoms in another 3 months. Atrial fibrillation: She was in atrial fibrillation essentially 100% of 2021, but there is been a remarkable reduction in atrial fibrillation and she has had almost no atrial fibrillation whatsoever in the last 3 months.  Anticoagulation: Despite the presence of biological aortic and mitral valve prostheses, we have switched from warfarin to Eliquis due to difficulty with maintaining INR levels in therapeutic range and bleeding complications.  No overt bleeding problems. History of syncope: No current complaints , history of syncope on higher doses of bisoprolol related to orthostatic  hypotension.  Tolerating metoprolol well.   COPD: No recent episodes of exacerbation.  Denies wheezing.  Continue Trelegy. 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  Improving on most recent scan from November 2022.  Last saw Dr. Jamey Reas on 02/14/2022 Recurrent vision loss: Evaluated by neurology and retina specialist.  Has not occurred recently.  Since the episodes are repetitive, this makes cardioembolic events virtually impossible.   Ascending aortic aneurysm: Very mild increase in diameter 4.2 cm, essentially stable in size on serial imaging studies.  Repeat scan is scheduled for 05/13/2022  HLP: Does not have known CAD/PAD, but does have some aortic atherosclerosis.  Current lipid parameters are acceptable.  DISH: spinal injection did not help.  Limits her activity.  Evaluated by neurology as well who recommended using a cane or walker. Memory problems: Neuropsych re-eval next month. Did well on MMSE (score 30).  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  Note is made of atherosclerotic calcification of the intracranial arteries at the base of the brain. ASCVD: Has aortic atherosclerosis. 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  No significant stenosis of the intracranial circulation.   Patient Instructions  Medication Instructions:  No changes *If you need a refill on your cardiac medications before your next appointment, please call your pharmacy*   Lab Work: None ordered If you have labs (blood work) drawn today and your tests are completely normal, you will receive your results only by: [place] (if you have  MyChart) OR A paper copy in the mail If you have any lab test that is abnormal or we need to change your treatment, we will call you to review the results.   Testing/Procedures: None ordered   Follow-Up: At [facility], you and your health needs are our priority.  As part of our continuing mission to provide you with exceptional heart care, we have created designated Provider Care Teams.  These Care Teams include your primary Cardiologist (physician) and Advanced Practice Providers (APPs -  Physician Assistants and Nurse Practitioners) who all work together to provide you with the care you need, when you need it.  We recommend signing up for the patient portal called "MyChart".  Sign up information is provided on this After Visit Summary.  MyChart is used to connect with patients for Virtual Visits (Telemedicine).  Patients are able to view lab/test results, encounter notes, upcoming appointments, etc.  Non-urgent messages can be sent to your provider as well.   To learn more about what you can do with MyChart, go to [email or web address].    Your next appointment:   6 month(s)  The format for your next appointment:   In Person  Provider:   Sanda Klein, MD {    Important Information About Sugar        Signed, Sanda Klein, MD  04/09/2022 6:04 PM    [place]

## 2022-04-04 NOTE — Telephone Encounter (Signed)
Patient does not remember what Dr. Royann Shivers said about why the right leg is swelling more than the left leg. Please call to verify with patient

## 2022-04-11 ENCOUNTER — Ambulatory Visit
Admission: RE | Admit: 2022-04-11 | Discharge: 2022-04-11 | Disposition: A | Discharge status: Home / Self Care | Payer: Medicare Other | Source: Ambulatory Visit | Attending: Family Medicine | Admitting: Family Medicine

## 2022-04-11 DIAGNOSIS — Z1231 Encounter for screening mammogram for malignant neoplasm of breast: Secondary | ICD-10-CM

## 2022-04-21 ENCOUNTER — Encounter: Payer: Self-pay | Admitting: Family Medicine

## 2022-04-21 MED ORDER — TRAZODONE HCL 50 MG PO TABS: 25.0000 mg | ORAL_TABLET | Freq: Every day | ORAL | 2 refills | Status: DC

## 2022-04-21 NOTE — Telephone Encounter (Signed)
Name of Medication: Tramadol Name of Pharmacy: [facility] or Written Date and Quantity: 03/23/22, #40 Last Office Visit and Type: 01/03/22, f/u Next Office Visit and Type: 06/24/22, CPE Last Controlled Substance Agreement Date: none Last UDS: none

## 2022-04-22 ENCOUNTER — Encounter: Payer: Self-pay | Admitting: Psychology

## 2022-04-22 ENCOUNTER — Ambulatory Visit (INDEPENDENT_AMBULATORY_CARE_PROVIDER_SITE_OTHER): Payer: Medicare Other | Admitting: Psychology

## 2022-04-22 ENCOUNTER — Ambulatory Visit: Payer: Medicare Other | Admitting: Psychology

## 2022-04-22 DIAGNOSIS — F4321 Adjustment disorder with depressed mood: Secondary | ICD-10-CM

## 2022-04-22 DIAGNOSIS — H548 Legal blindness, as defined in USA: Secondary | ICD-10-CM | POA: Insufficient documentation

## 2022-04-22 DIAGNOSIS — R4189 Other symptoms and signs involving cognitive functions and awareness: Secondary | ICD-10-CM

## 2022-04-22 DIAGNOSIS — I6381 Other cerebral infarction due to occlusion or stenosis of small artery: Secondary | ICD-10-CM | POA: Insufficient documentation

## 2022-04-22 MED ORDER — TRAMADOL HCL 50 MG PO TABS: 50.0000 mg | ORAL_TABLET | Freq: Two times a day (BID) | ORAL | 0 refills | Status: DC | PRN

## 2022-04-22 NOTE — Progress Notes (Signed)
   Psychometrician Note   Cognitive testing was administered to [place] by Milana Kidney, B.S. (psychometrist) under the supervision of Dr. Christia Reading, [name], licensed psychologist on 04/22/2022. Ms. Parkhill did not appear overtly distressed by the testing session per behavioral observation or responses across self-report questionnaires. Rest breaks were offered.    The battery of tests administered was selected by Dr. Christia Reading, [name] with consideration to Ms. Pamer current level of functioning, the nature of her symptoms, emotional and behavioral responses during interview, level of literacy, observed level of motivation/effort, and the nature of the referral question. This battery was communicated to the psychometrist. Communication between Dr. Christia Reading, [name] and the psychometrist was ongoing throughout the evaluation and Dr. Christia Reading, [name] was immediately accessible at all times. Dr. Christia Reading, [name] provided supervision to the psychometrist on the date of this service to the extent necessary to assure the quality of all services provided.    Mari Yosselyn Tax will return within approximately 1-2 weeks for an interactive feedback session with Dr. Melvyn Novas at which time her test performances, clinical impressions, and treatment recommendations will be reviewed in detail. Ms. [place] understands she can contact our office should she require our assistance before this time.  A total of 115 minutes of billable time were spent face-to-face with Ms. Leiter by the psychometrist. This includes both test administration and scoring time. Billing for these services is reflected in the clinical report generated by Dr. Christia Reading, [name]  This note reflects time spent with the psychometrician and does not include test scores or any clinical interpretations made by Dr. Melvyn Novas. The full report will follow in a separate note.

## 2022-04-22 NOTE — Telephone Encounter (Signed)
ERx 

## 2022-04-22 NOTE — Progress Notes (Signed)
NEUROPSYCHOLOGICAL EVALUATION [facility] of Neurology  Date of Evaluation: April 22, 2022  Reason for Referral:   [name] is a 76 y.o. right-handed Caucasian female referred by Metta Clines, D.O., to characterize her current cognitive functioning and assist with diagnostic clarity and treatment planning in the context of subjective cognitive decline.   Assessment and Plan:   Clinical Impression(s): [name] pattern of performance is suggestive of neuropsychological functioning within normal limits relative to age-matched peers. Verbal fluency represented a slight relative weakness; however, scores fell only in the below average range and are not cause for concern. Overall, performances were appropriate across all assessed cognitive domains. This includes processing speed, attention/concentration, executive functioning, receptive and expressive language, visuospatial abilities, and all aspects of learning and memory. [name] denied difficulties completing instrumental activities of daily living (ADLs) independently.   When describing memory complaints, [name] largely described long-term dysfunction, including losing large chunks of time surrounding distant events. The events she described were emotionally charged and likely traumatic as they generally surrounded the passing of her son and husband. Reports of long-term memory dysfunction are generally associated with psychiatric distress rather than a neurological disease process. This would seem the more likely explanation for her reported areas of concern at the present time. 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   Specific to memory, [name] was able to learn novel verbal and visual information efficiently and retain this knowledge  after lengthy delays. Overall, memory performance combined with intact performances across other areas of cognitive functioning is not suggestive of Alzheimer's disease. Likewise, her cognitive and behavioral profile is not suggestive of any other form of neurodegenerative illness presently.  Recommendations: Should she report cognitive or functional decline in the future, a repeat neuropsychological evaluation would be warranted at that time. The current evaluation will serve as an excellent baseline to draw future comparisons against.  A combination of medication and psychotherapy has been shown to be most effective at treating symptoms of anxiety and depression. As such, [name] is encouraged to speak with her prescribing physician regarding medication adjustments to optimally manage these symptoms. Likewise, [name] could also consider engaging in short-term psychotherapy to address symptoms of psychiatric distress. She would benefit from an active and collaborative therapeutic environment, rather than one purely supportive in nature. Recommended treatment modalities include Cognitive Behavioral Therapy (CBT) or Acceptance and Commitment Therapy (ACT).  [name] is encouraged to attend to lifestyle factors for brain health (e.g., regular physical exercise, good nutrition habits, regular participation in cognitively-stimulating activities, and general stress management techniques), which are likely to have benefits for both emotional adjustment and cognition. In fact, in addition to promoting good general health, regular exercise incorporating aerobic activities (e.g., brisk walking, jogging, cycling, etc.) has been demonstrated to be a very effective treatment for depression and stress, with similar efficacy rates to both antidepressant medication and psychotherapy. Optimal control of vascular risk factors (including safe cardiovascular exercise and adherence to dietary recommendations) is  encouraged. Continued participation in activities which provide mental stimulation and social interaction is also recommended.   Memory can be improved using internal strategies such as rehearsal, repetition, chunking, mnemonics, association, and imagery. External strategies such as written notes in a consistently used memory journal, visual and nonverbal auditory cues such as a calendar on the refrigerator or appointments with alarm, such as  on a cell phone, can also help maximize recall.    Review of Records:   [name] was seen by [facility] Metta Clines, D.O.) on 11/19/2021 for an evaluation of vision loss and memory dysfunction. 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 During the past year, she reported recurrent episodes of visual disturbance in her left eye, with semiology being consistent with amaurosis fugax. She described it seeming like a shade goes down and blacks out her vision, lasting 1-2 minutes. Regarding cognitive concerns, she reported memory decline during the past year. Examples surrounded trouble recalling family events. ADLs were described as intact. Performance on a brief cognitive screening instrument (SLUMS) was 22/30. Ultimately, [name] was referred for a comprehensive neuropsychological evaluation to characterize her cognitive abilities and to assist with diagnostic clarity and treatment planning.   Brain MRI on 01/27/2022 revealed mild to moderate microvascular ischemic changes, a small remote left caudate infarct, and mild cortical microhemorrhages.   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 Known DISH with thoracic spine fusion and moderate kyphosis, h/o L spine fusion L2-5   Adjustment  disorder with depressed mood 11/07/14   32 yo son died MI 2014-10-11 Husband died car accident 2015/04/12   Anemia    Anticoagulant long-term use 06/14/2019   She takes warfarin for afib/flutter with CHADS2VASc score of 4 (age, sex, CHF, HTN), s/p 2 bioprosthetic valve replacements Coumadin changed to eliquis 2022 per cardiology   Aortic atherosclerosis 03/03/2022   Noted on 01/2022 chest CT   Arthritis    Arthropathy of spinal facet joint 03/08/2018   Ascending aortic aneurysm 03/02/2021   4.1cm on CT 01/2021 rec yearly monitoring   Bilateral hip pain 09/08/2016   CAP (community acquired pneumonia) 02/08/2016   Carotid stenosis 12/08/2020   Carotid US - 40-59% BICA stenosis (2015) Carotid US 12/2020 - 1-39% BICA stenosis, incidental 3.4cm structure behind L common carotid artery rec further imaging    Cataracts, bilateral    immature   Chest pain 08/25/2014   myoview low risk 02/2018 ([place])   Chronic cholecystitis with calculus 01/21/2015   S/p cholecystectomy    Chronic diastolic heart failure 58/83/2549   Chronic insomnia    Chronic lower back pain 03/05/2008   Returned to Dr Ellene Route - translaminar ESI at L3/4. Myelogram showed significant left sided foraminal stenosis at L2/3 and L3/4 in setting of solid arthrodesis, planned DEXA and if stable osteopenia, considering anterolateral decompression with spacer.    CKD (chronic kidney disease) stage 2, GFR 60-89 ml/min 07/03/2015   Constipation    takes Miralax daily as needed   COPD (chronic obstructive pulmonary disease)    Albuterol inhaler daily as needed;Duoneb daily as needed;Spiriva daily   DDD (degenerative disc disease)    cervical - kyphosis with mod DD changes C5/6 and C6/7; lumbar - early DD at L2/3 (Elsner)   DISH (diffuse idiopathic skeletal hyperostosis) 05/14/2019   By MRI noted fusion of lower thoracic vertebrae - likely contributing to unsteadiness (Elsner)   Dyspnea on exertion 06/05/2015   myoview low risk 02/2018    Essential hypertension, benign 03/24/2009   GERD (gastroesophageal reflux disease)    takes Protonix daily   Hematuria 06/17/2021   History of radiation therapy 05/30/11 to 07/07/11  rectum   History of rectal cancer 05/26/2011   Rectal cancer 2012, midrectum [id number], s/p neoadj chemo&XRT, lap LAR with diverting loop ileostomy 08/2011, ileostomy takedown 03/2012 Discharged from oncology clinic 2017 Benay Spice)   History of shingles    History of vertebral fracture 11/26/2019   Old L1 vertebral compression fracture incidentally noted on imaging 2020    HLD (hyperlipidemia) 05/03/2007   Hyperlipemia    takes Lovastatin daily   Lacunar infarction    Left caudate   Left hip pain 07/29/2020   Legally blind in left eye    Lung nodule    RLL nodule-92m stable 2006, April 2009, and June 2009   Lymphangioma 01/07/2021   Noted incidentally on carotid [place]/2022 CT neck - Likely benign lymphangioma 01/2021   Macrocytosis 03/27/2022   Folate and b12 checked 2023   Malaise and fatigue 01/22/2018   Meralgia paresthetica 12/13/2007   Mixed stress and urge urinary incontinence 03/16/2015   Failed oxybutynin IR/ER. Vesicare initially helped but then no longer helpful.  myrbetriq was not effective.  Saw urology (MacDiarmid) - UDS largely overactive bladder with mild-mod stress incontinence. 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   Pulmonary infiltrates 04/23/2019   06/13/2018-CT super D chest without contrast- generally stable chronic lung disease with pleural parenchymal scarring and scattered nodularity most of these nodules have been tree-in-bud in appearance likely postinfectious or inflammatory the dominant right lower lobe nodule seen on most recent study has resolved no new or enlarging nodules  04/22/2019-CT chest without contrast- waxing and waning per   Pulmonary nodule 02/23/2012   RLL nodule-634mstable 2006, April 2009, and June 2009 Small pulm nodules Jan 2012 - > Oct 2012 without change   Rheumatic heart disease mitral stenosis    mod MS by echo 02/2014   Right leg swelling 06/11/2020   R venous [place]/2021 WNL: - No evidence of deep vein thrombosis in the lower extremity. No indirect evidence of obstruction proximal to the inguinal ligament. - No cystic structure found in the popliteal fossa.   S/P aortic and mitral valve bioprostheses 12/08/2013   AVR 21 mm, MVR 25 mm-MagnaEase pericardia   S/P AVR (aortic valve replacement) 2015   bioprosthetic (Bartle)   S/P MVR (mitral valve replacement) 2015   bioprosthetic (Bartle)   Sepsis secondary to [place]TI ([place]/13/2018   Severe aortic valve stenosis 10/01/2013   mild-mod by echo 02/2014   Skin lesion 01/03/2022   Small bowel obstruction, partial 08/2013   reolved without NGT placement.    Syncope 05/28/2018   Thrombocytopenia 11/04/2019   Typical  atrial flutter 05/15/2019   Vitamin B12 deficiency 02/28/2015   Start B12 shots 02/2015    Vitamin D deficiency 10/23/2016    Past  Surgical History:  Procedure Laterality Date   ANTERIOR LAT LUMBAR FUSION Left 07/17/2018   Procedure: Lumbar Two-Three Lumbar Three-Four Anterolateral decompression/interbody fusion with lateral plate fixation/Infuse;  Surgeon: Kristeen Miss, MD;  Location: [facility];  Service: Neurosurgery;  Laterality: Left;  Lumbar Two-Three Lumbar Three-Four Anterolateral decompression/interbody fusion with lateral plate fixation/Infuse   AORTIC VALVE REPLACEMENT N/A 12/12/2013   Procedure: AORTIC VALVE REPLACEMENT (AVR);  Surgeon: Gaye Pollack, MD; Service: Open Heart Surgery   BACK SURGERY  2006, 2007   2006 SPACER, 2007 decompression and fusion L4/5   BOWEL RESECTION  04/02/2012   Procedure: SMALL BOWEL RESECTION;  Surgeon: Stark Klein, MD;  Location: [facility];  Service: General;  Laterality: N/A;   BRONCHIAL WASHINGS  03/24/2021   Procedure: BRONCHIAL WASHINGS;  Surgeon: Brand Males, MD;  Location: [facility];  Service: Endoscopy;;   CARPAL TUNNEL RELEASE Bilateral    CATARACT EXTRACTION W/ INTRAOCULAR LENS IMPLANT Left 10/18/2016   CATARACT EXTRACTION W/ INTRAOCULAR LENS IMPLANT Right 12/13/2016   Dr. Kathrin Penner   CHOLECYSTECTOMY N/A 01/21/2015   chronic cholecystitis, Stark Klein, MD   COLON RESECTION  08/25/2011   Procedure: COLON RESECTION LAPAROSCOPIC;  Surgeon: Stark Klein, MD;  Location: [facility];  Service: General;  Laterality: N/A;  Laparoscopic Assisted Low Anterior Resection Diverting Ostomy and onQ pain pump   COLONOSCOPY  03/2013   1 polyp, rpt 3 yrs Ardis Hughs)   COLONOSCOPY  05/2021   diverticulosis and patent colon anastomosis with some edema/narrowing, rpt 5 yrs Ardis Hughs)   COLONOSCOPY WITH PROPOFOL N/A 06/09/2016   patent colo-colonic anastomosis, rpt 5 yrs Ardis Hughs)   EP IMPLANTABLE DEVICE N/A 06/16/2016   Procedure: Pacemaker Implant;  Surgeon: Will Meredith Leeds, MD;  Location: [facility];  Service: Cardiovascular;  Laterality: N/A;   FOOT SURGERY  left foot    hammer toe   ILEOSTOMY  08/25/2011   ILEOSTOMY CLOSURE  04/02/2012   Procedure: ILEOSTOMY TAKEDOWN;  Surgeon: Stark Klein, MD;  Location: [facility];  Service: General;  Laterality: N/A;   INTRAOPERATIVE TRANSESOPHAGEAL ECHOCARDIOGRAM N/A 12/12/2013   Procedure: INTRAOPERATIVE TRANSESOPHAGEAL ECHOCARDIOGRAM;  Surgeon: Gaye Pollack, MD;  Location: [facility];  Service: Open Heart Surgery;  Laterality: N/A;   LEFT AND RIGHT HEART CATHETERIZATION WITH CORONARY ANGIOGRAM N/A 11/27/2013   Procedure: LEFT AND RIGHT HEART CATHETERIZATION WITH CORONARY ANGIOGRAM;  Surgeon: Sanda Klein, MD;  Location: [facility];  Service: Cardiovascular;  Laterality: N/A;   MITRAL VALVE REPLACEMENT N/A 12/12/2013   Procedure: MITRAL VALVE (MV) REPLACEMENT OR REPAIR;  Surgeon: Gaye Pollack, MD; Service: Open Heart Surgery   TEE WITHOUT CARDIOVERSION N/A 11/29/2013   Procedure: TRANSESOPHAGEAL ECHOCARDIOGRAM (TEE);  Surgeon: Sueanne Margarita, MD;  Location: [facility];  Service: Cardiovascular;  Laterality: N/A;   TEE WITHOUT CARDIOVERSION N/A 06/04/2019   Procedure: TRANSESOPHAGEAL ECHOCARDIOGRAM (TEE);  Surgeon: Sanda Klein, MD;  Location: [facility];  Service: Cardiovascular;  Laterality: N/A;   TONSILLECTOMY     VIDEO BRONCHOSCOPY N/A 03/24/2021   Procedure: VIDEO BRONCHOSCOPY WITHOUT FLUORO;  Surgeon: Brand Males, MD;  Location: [facility];  Service: Endoscopy;  Laterality: N/A;  SMALL SCOPE,PREMEDICATE WITH NEBULIZED LIDOCAINE    Current Outpatient Medications:    acetaminophen (TYLENOL) 500 MG tablet, Take 2 tablets (1,000 mg total) by mouth daily., Disp: , Rfl:    albuterol (VENTOLIN HFA) 108 (  90 Base) MCG/ACT inhaler, Inhale 2 puffs into the lungs every 6 (six) hours as needed for wheezing. (Patient not taking: Reported on 04/04/2022), Disp: 18 g, Rfl: 6   apixaban (ELIQUIS) 5 MG TABS tablet, Take 1 tablet (5 mg total) by mouth 2 (two) times daily., Disp: 60 tablet, Rfl: 11   Biotin 1000 MCG tablet,  Take 1,000 mcg by mouth daily., Disp: , Rfl:    calcium carbonate (OSCAL) 1500 (600 Ca) MG TABS tablet, Take 600 mg of elemental calcium by mouth daily., Disp: , Rfl:    carboxymethylcellulose (REFRESH PLUS) 0.5 % SOLN, Place 1 drop into both eyes 3 (three) times daily as needed (dry eyes)., Disp: , Rfl:    cephALEXin (KEFLEX) 250 MG capsule, Take 250 mg by mouth daily., Disp: , Rfl:    Cholecalciferol (VITAMIN D) 50 MCG (2000 UT) tablet, Take 2,000 Units by mouth daily., Disp: , Rfl:    docusate sodium (COLACE) 100 MG capsule, Take 200 mg by mouth every evening., Disp: , Rfl:    Fluticasone-Umeclidin-Vilant (TRELEGY ELLIPTA) 200-62.5-25 MCG/ACT AEPB, INHALE 1 PUFF INTO THE LUNGS DAILY, Disp: 60 each, Rfl: 11   furosemide (LASIX) 40 MG tablet, Take 1 tablet (40 mg total) by mouth daily. May take an extra tablet daily as needed for swelling., Disp: 45 tablet, Rfl: 11   gabapentin (NEURONTIN) 300 MG capsule, Take 300 mg by mouth at bedtime., Disp: , Rfl:    ipratropium-albuterol (DUONEB) 0.5-2.5 (3) MG/3ML SOLN, Take 3 mLs by nebulization every 6 (six) hours as needed. (Patient not taking: Reported on 04/04/2022), Disp: 360 mL, Rfl: 0   lovastatin (MEVACOR) 40 MG tablet, TAKE 1 TABLET(40 MG) BY MOUTH AT BEDTIME, Disp: 90 tablet, Rfl: 1   montelukast (SINGULAIR) 10 MG tablet, TAKE 1 TABLET(10 MG) BY MOUTH AT BEDTIME, Disp: 90 tablet, Rfl: 1   Multiple Vitamins-Minerals (PRESERVISION AREDS 2 PO), Take 2 capsules by mouth every evening. , Disp: , Rfl:    polyethylene glycol (MIRALAX / GLYCOLAX) packet, Take 17 g by mouth daily. (Patient not taking: Reported on 04/04/2022), Disp: 14 each, Rfl: 0   potassium chloride (KLOR-CON) 10 MEQ tablet, TAKE 2 TABLETS(20 MEQ) BY MOUTH DAILY, Disp: 180 tablet, Rfl: 3   silver sulfADIAZINE (SILVADENE) 1 % cream, Apply 1 application topically daily. (Patient not taking: Reported on 04/04/2022), Disp: 50 g, Rfl: 1   traMADol (ULTRAM) 50 MG tablet, Take 1 tablet (50 mg total)  by mouth every 12 (twelve) hours as needed for moderate pain., Disp: 40 tablet, Rfl: 0   traZODone (DESYREL) 50 MG tablet, Take 0.5-1 tablets (25-50 mg total) by mouth at bedtime., Disp: 30 tablet, Rfl: 2   vitamin B-12 (CYANOCOBALAMIN) 500 MCG tablet, Take 1 tablet (500 mcg total) by mouth 2 (two) times a week., Disp: , Rfl:    Zinc 50 MG CAPS, Take 1 capsule (50 mg total) by mouth daily., Disp: , Rfl: 0 No current facility-administered medications for this visit.  Facility-Administered Medications Ordered in Other Visits:    ondansetron (ZOFRAN) 4 mg in sodium chloride 0.9 % 50 mL IVPB, 4 mg, Intravenous, Q6H PRN, Kristeen Miss, MD  Clinical Interview:   The following information was obtained during a clinical interview with Ms. Gingrich prior to cognitive testing.  Cognitive Symptoms: Decreased short-term memory: Endorsed. She described having no recollection of "blocks of time," with her reporting perhaps more trouble with long-term memory than short-term memory. These blocks of time generally surrounded emotionally charged events, like the passing of  her son and husband. She also described not recalling going with a church group to see Christmas lights. Deficits were first described as concerning during the past year. She was unsure if abilities had declined over time.  Decreased long-term memory: Denied (outside what is described above). Decreased attention/concentration: Endorsed "sometimes." She noted that her mind may wander.  Reduced processing speed: Endorsed. She reported she feels "not as prompt" with her thinking abilities.  Difficulties with executive functions: Endorsed "sometimes." Potential difficulties surrounded multi-tasking and organization. She denied trouble with impulsivity or any significant personality changes.  Difficulties with emotion regulation: Denied. Difficulties with receptive language: Denied. Difficulties with word finding: Endorsed. Decreased visuoperceptual  ability: Endorsed. She reported commonly bumping into things in her environment or door frames as she enters and leaves rooms.   Difficulties completing ADLs: Denied.  Additional Medical History: History of traumatic brain injury/concussion: Denied. History of stroke: Denied. History of seizure activity: Denied. History of known exposure to toxins: Denied. Symptoms of chronic pain: Endorsed. She reported chronic lower back pain.  Experience of frequent headaches/migraines: Denied. Frequent instances of dizziness/vertigo: Denied. Symptoms were said to occur "occasionally" and are not always caused by standing quickly.   Sensory changes: As stated above, she is legally blind in her left eye. Despite this, she utilizes glasses well and reported no reading difficulties. She also reported hearing loss and utilizes hearing aids. Other sensory changes/difficulties (e.g., taste or smell) were denied.  Balance/coordination difficulties: Denied. She also denied any recent falls.  Other motor difficulties: Denied.  Sleep History: Estimated hours obtained each night: 4-6 hours.  Difficulties falling asleep: Denied. Difficulties staying asleep: Endorsed. She reported waking several times to use the restroom, as well as times where the cause is unknown. She reported commonly waking around 3am, showering due to her feeling active and awake, and may return back to sleep for an hour or two later in the morning. Sleep was described as broken overall.  Feels rested and refreshed upon awakening: Endorsed.  History of snoring: Denied. History of waking up gasping for air: Denied. Witnessed breath cessation while asleep: Denied.  History of vivid dreaming: Denied. Excessive movement while asleep: Denied. Instances of acting out her dreams: Denied.  Psychiatric/Behavioral Health History: Depression: Currently, she stated she may experience depressive symptoms "some days." However, this is not a consistent  experience and she denied to her knowledge of any prior mental health concerns or diagnoses (outside of grief and bereavement surrounding the passing of her son and husband). Current depressive symptoms focused on days where she has "nowhere to go and nothing to do." Current or remote suicidal ideation, intent, or plan was denied.  Anxiety: As with depressive symptoms, anxiety was said to be present "some days." She denied being previously diagnosed with a formal anxiety disorder.  Mania: Denied. Trauma History: Denied. Visual/auditory hallucinations: Denied. Delusional thoughts: Denied.  Tobacco: Denied. Alcohol: She denied current alcohol consumption as well as a history of problematic alcohol abuse or dependence.  Recreational drugs: Denied.  Family History: Family History  Problem Relation Age of Onset   Heart disease Mother    Diabetes Mother    Hypertension Mother    Stroke Mother    Lung cancer Father    Breast cancer Maternal Aunt 83   Heart attack Maternal Grandfather    CAD Son 28   Heart attack Son 37   Rectal cancer Neg Hx    Stomach cancer Neg Hx    Esophageal cancer Neg Hx  Colon cancer Neg Hx    This information was confirmed by Ms. Jacki Cones.  Academic/Vocational History: Highest level of educational attainment: 12 years. She graduated high school. She reported entering nursing school after this but left after approximately 9 months. 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 Math was noted as a relative weakness.   History of developmental delay: Denied. History of grade repetition: Denied. Enrollment in special education courses: Denied. History of LD/ADHD: Endorsed.  Employment: Retired. 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   Evaluation Results:   Behavioral Observations: Ms. Pellerito was unaccompanied, arrived to  her appointment on time, and was appropriately dressed and groomed. She appeared alert and oriented. Observed gait and station were within normal limits. Gross motor functioning appeared intact upon informal observation and no abnormal movements (e.g., tremors) were noted. Her affect was generally relaxed and positive, but did range appropriately given the subject being discussed during the clinical interview or the task at hand during testing procedures. Spontaneous speech was fluent and word finding difficulties were not observed during the clinical interview. Thought processes were coherent, organized, and normal in content. Insight into her cognitive difficulties appeared adequate.   During testing, sustained attention was appropriate. Task engagement was adequate and she persisted when challenged. She did fatigue as the evaluation progressed. It was slightly abbreviated in response. Overall, Ms. Scibilia was cooperative with the clinical interview and subsequent testing procedures.   Adequacy of Effort: The validity of neuropsychological testing is limited by the extent to which the individual being tested may be assumed to have exerted adequate effort during testing. Ms. Lac expressed her intention to perform to the best of her abilities and exhibited adequate task engagement and persistence. Scores across stand-alone and embedded performance validity measures were within expectation. As such, the results of the current evaluation are believed to be a valid representation of Ms. Pilant current cognitive functioning.  Test Results: Ms. Perman was fully oriented at the time of the current evaluation.  Intellectual abilities based upon educational and vocational attainment were estimated to be in the average range. Premorbid abilities were estimated to be within the average range based upon a single-word reading test.   Processing speed was average to exceptionally high. Basic attention was  average to above average. More complex attention (e.g., working memory) was also average to above average. Executive functioning was variable but overall appropriate, ranging from the below average to above average normative ranges.  Assessed receptive language abilities were above average. Likewise, Ms. Bernstein did not exhibit any difficulties comprehending task instructions and answered all questions asked of her appropriately. Assessed expressive language (e.g., verbal fluency and confrontation naming) was mildly variable but overall appropriate, ranging from the below average to above average normative ranges.     Assessed visuospatial/visuoconstructional abilities were average to above average. Points were lost on her drawing of a clock due to incorrect hand placement.     Learning (i.e., encoding) of novel verbal information was above average. Spontaneous delayed recall (i.e., retrieval) of previously learned information was below average on a figure drawing task but above average across both verbal tasks. Performance across recognition tasks was strong, suggesting evidence for information consolidation.   Results of emotional screening instruments suggested that recent symptoms of generalized anxiety were in the minimal to mild range, while symptoms of depression were within the mild range. A screening  instrument assessing recent sleep quality suggested the presence of mild sleep dysfunction.  Tables of Scores:   Note: This summary of test scores accompanies the interpretive report and should not be considered in isolation without reference to the appropriate sections in the text. Descriptors are based on appropriate normative data and may be adjusted based on clinical judgment. Terms such as "Within Normal Limits" and "Outside Normal Limits" are used when a more specific description of the test score cannot be determined.       Percentile - Normative Descriptor > 98 - Exceptionally High 91-97  - Well Above Average 75-90 - Above Average 25-74 - Average 9-24 - Below Average 2-8 - Well Below Average < 2 - Exceptionally Low       Validity:   DESCRIPTOR       Dot Counting Test: --- --- Within Normal Limits  RBANS Effort Index: --- --- Within Normal Limits  WAIS-IV Reliable Digit Span: --- --- Within Normal Limits       Orientation:      Raw Score Percentile   NAB Orientation, Form 1 29/29 --- ---       Cognitive Screening:      Raw Score Percentile   SLUMS: 25/30 --- ---       RBANS, Form A: Standard Score/ Scaled Score Percentile   Total Score 113 81 Above Average  Immediate Memory 114 82 Above Average    List Learning 12 75 Above Average    Story Memory 13 84 Above Average  Visuospatial/Constructional 112 79 Above Average    Figure Copy 12 75 Above Average    Line Orientation 18/20 51-75 Average  Language 92 30 Average    Picture Naming 10/10 51-75 Average    Semantic Fluency 7 16 Below Average  Attention 122 93 Well Above Average    Digit Span 11 63 Average    Coding 16 98 Exceptionally High  Delayed Memory 107 68 Average    List Recall 9/10 >75 Above Average    List Recognition 20/20 51-75 Average    Story Recall 13 84 Above Average    Story Recognition 12/12 69+ Average    Figure Recall 7 16 Below Average    Figure Recognition 8/8 70+ Average        Intellectual Functioning:      Standard Score Percentile   Test of Premorbid Functioning: 98 45 Average       Attention/Executive Function:     Trail Making Test (TMT): Raw Score (T Score) Percentile     Part A 37 secs.,  0 errors (52) 58 Average    Part B 64 secs.,  1 error (61) 86 Above Average         Scaled Score Percentile   WAIS-IV Digit Span: 12 75 Above Average    Forward 12 75 Above Average    Backward 12 75 Above Average    Sequencing 11 63 Average       D-KEFS Color-Word Interference Test: Raw Score (Scaled Score) Percentile     Color Naming 23 secs. (14) 91 Well Above Average    Word  Reading 19 secs. (13) 84 Above Average    Inhibition 59 secs. (12) 75 Above Average      Total Errors 3 errors (10) 50 Average    Inhibition/Switching 92 secs. (8) 25 Average      Total Errors 6 errors (7) 16 Below Average       D-KEFS Verbal Fluency Test: Raw Score (  Scaled Score) Percentile     Letter Total Correct 24 (7) 16 Below Average    Category Total Correct 25 (7) 16 Below Average    Category Switching Total Correct 12 (10) 50 Average    Category Switching Accuracy 11 (10) 50 Average      Total Set Loss Errors 0 (13) 84 Above Average      Total Repetition Errors 2 (11) 63 Average       Language:      Raw Score Percentile   Sentence Repetition: 17/22 89 Above Average       Verbal Fluency Test: Raw Score (T Score) Percentile     Phonemic Fluency (FAS) 24 (37) 9 Below Average    Animal Fluency 12 (38) 12 Below Average        NAB Language Module, Form 1: T Score Percentile     Auditory Comprehension 58 79 Above Average    Naming 30/31 (60) 84 Above Average       Visuospatial/Visuoconstruction:      Raw Score Percentile   Clock Drawing: 8/10 --- Within Normal Limits        Scaled Score Percentile   WAIS-IV Block Design: 10 50 Average       Mood and Personality:      Raw Score Percentile   Geriatric Depression Scale: 12 --- Mild  Geriatric Anxiety Scale: 11 --- Minimal    Somatic 7 --- Mild    Cognitive 2 --- Minimal    Affective 2 --- Minimal       Additional Questionnaires:      Raw Score Percentile   PROMIS Sleep Disturbance Questionnaire: 25 --- Mild   Informed Consent and Coding/Compliance:   The current evaluation represents a clinical evaluation for the purposes previously outlined by the referral source and is in no way reflective of a forensic evaluation.   Ms. Ginther was provided with a verbal description of the nature and purpose of the present neuropsychological evaluation. Also reviewed were the foreseeable risks and/or discomforts and benefits of  the procedure, limits of confidentiality, and mandatory reporting requirements of this provider. The patient was given the opportunity to ask questions and receive answers about the evaluation. Oral consent to participate was provided by the patient.   This evaluation was conducted by Christia Reading, [name], ABPP-CN, board certified clinical neuropsychologist. Ms. Chawla completed a clinical interview with Dr. Melvyn Novas, billed as one unit [phone], and 115 minutes of cognitive testing and scoring, billed as one unit [phone] and three additional units [id number]. Psychometrist Milana Kidney, B.S., assisted Dr. Melvyn Novas with test administration and scoring procedures. As a separate and discrete service, Dr. Melvyn Novas spent a total of 160 minutes in interpretation and report writing billed as one unit [phone] and two units [id number].

## 2022-04-25 ENCOUNTER — Encounter: Payer: Medicare Other | Admitting: Psychology

## 2022-04-29 ENCOUNTER — Ambulatory Visit (INDEPENDENT_AMBULATORY_CARE_PROVIDER_SITE_OTHER): Payer: Medicare Other

## 2022-04-29 DIAGNOSIS — I442 Atrioventricular block, complete: Secondary | ICD-10-CM

## 2022-04-29 LAB — CUP PACEART REMOTE DEVICE CHECK
Battery Remaining Longevity: 38 mo
Battery Remaining Percentage: 38 %
Battery Voltage: 2.93 V
Brady Statistic AP VP Percent: 71 %
Brady Statistic AP VS Percent: 3.1 %
Brady Statistic AS VP Percent: 26 %
Brady Statistic AS VS Percent: 1 %
Brady Statistic RA Percent Paced: 73 %
Brady Statistic RV Percent Paced: 96 %
Date Time Interrogation Session: 20230721040013
Implantable Lead Implant Date: 20170907
Implantable Lead Implant Date: 20170907
Implantable Lead Location: 753859
Implantable Lead Location: 753860
Implantable Pulse Generator Implant Date: 20170907
Lead Channel Impedance Value: 400 Ohm
Lead Channel Impedance Value: 530 Ohm
Lead Channel Pacing Threshold Amplitude: 0.25 V
Lead Channel Pacing Threshold Amplitude: 0.625 V
Lead Channel Pacing Threshold Pulse Width: 0.5 ms
Lead Channel Pacing Threshold Pulse Width: 0.5 ms
Lead Channel Sensing Intrinsic Amplitude: 3.3 mV
Lead Channel Sensing Intrinsic Amplitude: 6 mV
Lead Channel Setting Pacing Amplitude: 0.875
Lead Channel Setting Pacing Amplitude: 2 V
Lead Channel Setting Pacing Pulse Width: 0.5 ms
Lead Channel Setting Sensing Sensitivity: 2 mV
Pulse Gen Model: 2272
Pulse Gen Serial Number: 7945283

## 2022-05-02 ENCOUNTER — Ambulatory Visit (INDEPENDENT_AMBULATORY_CARE_PROVIDER_SITE_OTHER): Payer: Medicare Other | Admitting: Psychology

## 2022-05-02 DIAGNOSIS — R4189 Other symptoms and signs involving cognitive functions and awareness: Secondary | ICD-10-CM

## 2022-05-02 DIAGNOSIS — I6381 Other cerebral infarction due to occlusion or stenosis of small artery: Secondary | ICD-10-CM | POA: Diagnosis not present

## 2022-05-02 DIAGNOSIS — F4321 Adjustment disorder with depressed mood: Secondary | ICD-10-CM

## 2022-05-02 NOTE — Progress Notes (Signed)
   Neuropsychology Feedback Session [name]. [place] [facility]  Reason for Referral:   [name] is a 76 y.o. right-handed Caucasian female referred by Metta Clines, D.O., to characterize her current cognitive functioning and assist with diagnostic clarity and treatment planning in the context of subjective cognitive decline.   Feedback:   [name] completed a comprehensive neuropsychological evaluation on 04/22/2022. Please refer to that encounter for the full report and recommendations. Briefly, results suggested neuropsychological functioning within normal limits relative to age-matched peers. Verbal fluency represented a slight relative weakness; however, scores fell only in the below average range and are not cause for concern. Overall, performances were appropriate across all assessed cognitive domains. When describing memory complaints, [name] largely described long-term dysfunction, including losing large chunks of time surrounding distant events. The events she described were emotionally charged and likely traumatic as they generally surrounded the passing of her son and husband. Reports of long-term memory dysfunction are generally associated with psychiatric distress rather than a neurological disease process. This would seem the more likely explanation for her reported areas of concern at the present time. 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   [name] was accompanied by her granddaughter during the current feedback session. Content of the current session focused on the results of her neuropsychological evaluation. [name] was given the opportunity to ask questions and her questions were answered. She was encouraged to reach out should additional questions arise. A copy of her report  was provided at the conclusion of the visit.      20 minutes were spent conducting the current feedback session with [name], billed as one unit [phone].

## 2022-05-13 ENCOUNTER — Ambulatory Visit
Admission: RE | Admit: 2022-05-13 | Discharge: 2022-05-13 | Disposition: A | Discharge status: Home / Self Care | Payer: Medicare Other | Source: Ambulatory Visit | Attending: Family Medicine | Admitting: Family Medicine

## 2022-05-13 DIAGNOSIS — I712 Thoracic aortic aneurysm, without rupture, unspecified: Secondary | ICD-10-CM | POA: Diagnosis not present

## 2022-05-13 DIAGNOSIS — J449 Chronic obstructive pulmonary disease, unspecified: Secondary | ICD-10-CM | POA: Diagnosis not present

## 2022-05-13 DIAGNOSIS — R918 Other nonspecific abnormal finding of lung field: Secondary | ICD-10-CM | POA: Insufficient documentation

## 2022-05-13 DIAGNOSIS — Z2239 Carrier of other specified bacterial diseases: Secondary | ICD-10-CM | POA: Diagnosis not present

## 2022-05-16 NOTE — Progress Notes (Signed)
Remote pacemaker transmission.   

## 2022-05-17 ENCOUNTER — Ambulatory Visit: Payer: Medicare Other | Admitting: Physician Assistant

## 2022-05-19 NOTE — Progress Notes (Signed)
Noting urgent on CT. Will discuss at clinic visit. Wil NOT be calling with results xxxx  IMPRESSION: 1. Waxing waning bilateral pulmonary nodules suggest chronic pulmonary infection/atypical infection. 2. Branching nodular pattern within the medial upper lobes also consistent with atypical pulmonary infection. 3. Dense valvular calcifications with valvular prosthetics.   Electronically Signed   By: Suzy Bouchard [name]   On: 05/13/2022 15:59

## 2022-05-20 ENCOUNTER — Other Ambulatory Visit: Payer: Self-pay | Admitting: Family Medicine

## 2022-05-21 ENCOUNTER — Encounter: Payer: Self-pay | Admitting: Family Medicine

## 2022-05-23 NOTE — Telephone Encounter (Signed)
Name of Medication: Tramadol Name of Pharmacy: [facility] or Written Date and Quantity: 04/19/22, #40 Last Office Visit and Type: 01/03/22, f/u with labs Next Office Visit and Type: 06/24/22 Last Controlled Substance Agreement Date: none Last UDS: none

## 2022-05-24 ENCOUNTER — Ambulatory Visit (INDEPENDENT_AMBULATORY_CARE_PROVIDER_SITE_OTHER): Payer: Medicare Other | Admitting: Internal Medicine

## 2022-05-24 ENCOUNTER — Encounter: Payer: Self-pay | Admitting: Internal Medicine

## 2022-05-24 VITALS — BP 112/62 | HR 93 | Ht 64.0 in | Wt 149.2 lb

## 2022-05-24 DIAGNOSIS — R918 Other nonspecific abnormal finding of lung field: Secondary | ICD-10-CM | POA: Diagnosis not present

## 2022-05-24 DIAGNOSIS — R768 Other specified abnormal immunological findings in serum: Secondary | ICD-10-CM

## 2022-05-24 DIAGNOSIS — J449 Chronic obstructive pulmonary disease, unspecified: Secondary | ICD-10-CM

## 2022-05-24 NOTE — Patient Instructions (Addendum)
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 -No indication for biopsy as determined by Dr. Roxan Hockey May 2023 -This could be low-grade infection or could even be autoimmune based on significantly elevated ANA -Features do not fit in with cancer  Plan = Keep appointment with rheumatology for positive ANA -We will determine timing of next CT scan of the chest at the time of next follow-up -Consider flu vaccine, RSV vaccine and COVID mRNA booster in the fall 2023      Follow-up - Return to see Dr. [name] in 6 months or sooner if needed

## 2022-05-24 NOTE — Progress Notes (Signed)
PCP Ria Bush, MD  HPI    OV 08/02/2017  Chief Complaint  Patient presents with   Follow-up    Pt states that she got bronchitis x1 month ago. Has been on two rounds of abx and two rounds of steroids. States that it is not in her chest anymore but is still having drainage, sore throat, dry cough, occ. SOB. Denies any CP.    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 She comes for routine follow-up. Recently she had COPD flareup was treated by primary care. Then I called in antibiotic and repeat prednisone and this again helped her. Nevertheless she is frustrated by postnasal drainage and the resultant cough. She says normally for COPD exacerbations are associated with initial onset of sinus congestion. Then she's always left with a residual postnasal drip but this time it has lingered longer. She says Sudafed helps her but is no longer available over-the-counter. She really wants relief about this critically. COPD cat score is 14. Is also asking me to sign handicap parking sticker   OV 10/31/2017  Chief Complaint  Patient presents with   Follow-up    In hosp. 11/13-11/15 due to sepsis from UTI. Pt states she is doing good but can't seem to get her energy back. Pt currently has laryngitis and SOB with exertion.    Follow-up Gold stage II/III COPD not on home oxygen but on triple inhaler therapy.  Is a routine follow-up.  Since I last saw her in the middle of November 2018 she got diagnosed with E. coli sepsis and admitted to the hospital.  Since then she has significant amount of fatigue.  The fatigue is not improved much.  She has suspended her maintenance pulmonary rehabilitation but plans to restart it in March 2019.  She has not had any COPD exacerbations.  She is up-to-date with the flu shot.  Overall COPD CAT score is stable but there is an increase in the level of fatigue.  The only new issue that she complains about is some mild  hoarseness in voice going on for several months.  This is present early in the morning when she wakes up and then as the day goes on it is better.  She notices it when she talks a lot of time in the phone after a long period of rest.  It is insidious onset.  It is not progressive.  There are no other associated symptoms.   OV 02/05/2018  Chief Complaint  Patient presents with   Follow-up    Pt states she saw PCP two weeks ago and had pna. Pt states she is still not over the pna and has no energy, has hoarseness in voice, cough, and has chest tightness.     Follow-up gold stage II/III COPD not on home oxygen but on triple inhaler therapy with Singulair  Routine follow-up.  She tells me t of labs from January 22, 2018 hat January 22, 2018 she started feeling chest tightness wheezing and also increase head cold.  She saw her primary care physician Dr. Danise Mina.  Apparently all the blood work was normal except for mild increase in liver enzymes which she says she fluctuates with.  He was initially thinking a sinus congestion but when he did an x-ray of the report came back as a right lower lobe pneumonia [I personally visualized this x-ray and I agree she might not have right lower lobe pneumonia because in 2015 CT  chest she had some chronic scarring at that area].  She was treated with 5-day Levaquin and a Medrol Dosepak but despite this other than subjective fevers are resolving she still has significant cough congestion and chest tightness without any orthopnea proximal nocturnal dyspnea.  COPD CAT score shows significant deterioration.  She is not feeling well.  Of note, she is intentionally losing weight because of weight watchers but she has not increased her fluid intake adequately.  She has quit all diet soda  Show slight increase in liver enzymes and also slight increase in creatinine compared to 6 months ago.   OV 03/13/2018  Chief Complaint  Patient presents with   Follow-up    Breathing is  improved since last OV. Still reports DOE and occasional cough. Denies chest tightness or wheezing.    Chong J  presents for follow-up of her gold stage II /3 of COPD.  On triple inhaler therapy with Spiriva and Brio and also on Singulair   Overall in terms of her COPD: She is stable.  She is compliant with her inhalers.  She is not on oxygen therapy.  Her symptoms actually improved.  In the interim she was diagnosed with atrial fibrillation and she is placed on Xarelto.  She is frustrated because she has to be on 1 more extra medication.  She did have pulmonary function test that is documented below because it has been a while since we did one.  It actually shows improvement in the lung function.  She also had a CT scan of the chest because the last one was in 2015.  This shows scattered lung nodules with the largest one 8 mm in the right lower lobe.  The radiologist has described this is nonspecific in appearance.  She heard the result and she has been extremely anxious and upset.  She is worried that her colon cancer has metastasized or she has lung cancer.  Of note, I personally visualized the CT chest and agree with the findings   OV 06/27/2018  Subjective:  Patient ID: [name], female , DOB: [birth year] , age 31 y.o. , MRN: [id number] , ADDRESS: [address] [place] [address]   06/27/2018 -   Chief Complaint  Patient presents with   Follow-up    Pt states her breathing has been doing good since last visit. States she has been having some BP issues to the point she fainted and cracked some ribs.  Denies any problems with cough or CP.     HPI [name] 76 y.o. - follow-up for  - COPD: Overall this is stable with inhaler therapy. She has not had her flu shot. She wants to wait 2 weeks because she just had first shot of her shingles vaccine. She is up-to-date with her pneumonia vaccine. In the interim she has lost weight 60 pounds intentionally and this is improved  on Score  Multiple  Lung nodules: She had follow-up CT chest in September 2019 and compared to May 2019: One of the nodules is resolved to 16 mm and stable. She does have some rib fractures subacute and this is because she fell down. She's been having balance issues with the weight loss and orthostatic hypotension. Both of this is being managed by neurosurgery and primary care  Travel advice: She plans to have back surgery for her foot drop after this she plans to recover at her son's home near [place]. Therefore she wants a antibiotic and prednisone taper to  be handy with her in case of a future COPD exacerbation.    Results for TORIE, TOWLE (MRN [id number]) as of 03/13/2018 11:58  Ref. Range 11/29/2013 16:04 04/01/2016 10:10 03/13/2018 10:36  FEV1-Pre Latest Units: L 0.93 1.35 1.42  FEV1-%Pred-Pre Latest Units: % 34 55 59   Results for XOIE, KREUSER (MRN [id number]) as of 03/13/2018 11:58  Ref. Range 03/13/2018 10:36  DLCO cor Latest Units: ml/min/mmHg 22.70  DLCO cor % pred Latest Units: % 84     CT chest Sept 4, 2019 Lungs/Pleura: Pleural thickening posteriorly in the right hemithorax is stable. There is no significant pleural effusion. There is stable subpleural scarring posteriorly in the right lower lobe. There is central airway thickening with scattered tree-in-bud nodularity in both lungs, similar to the most recent prior study. Nodular scarring in both upper lobes is stable. The right lower lobe perifissural nodule present on the most recent study has resolved. There is a stable 6 x 5 mm right lower lobe nodule on image 119/3. No new or enlarging pulmonary nodules.    IMPRESSION: 1. Generally stable chronic lung disease with pleuroparenchymal scarring and scattered nodularity. Most of these nodules have a tree-in-bud appearance, likely post infectious or inflammatory. The dominant right lower lobe nodule seen on the most recent study has resolved. No new or enlarging  nodules. 2. No acute chest findings. Healing subacute right-sided rib fractures. 3. Extensive Aortic Atherosclerosis (ICD10-I70.0).     Electronically Signed   By: Richardean Sale [name]   On: 06/13/2018 14    ROS - per HPI  OV 01/22/2020  Subjective:  Patient ID: [name], female , DOB: 1946-03-26 , age 71 y.o. , MRN: [id number] , ADDRESS: [address] [place] [address]   01/22/2020 -   Chief Complaint  Patient presents with   Follow-up    Pt states she has been doing good since last visit and denies any complaints.     HPI [name] 76 y.o. -presents for follow-up.  Last seen in 2019.  After that because of the pandemic have not seen her.  She saw the nurse practitioner once.  The big interval changes that she has lost a lot of weight.  It is intentional because she is try to keep her self it.  From a functional standpoint COPD symptoms are much improved after weight loss.  She takes Spiriva Singulair and Breo.  However she is having significant back pain.  Other than that she feels fine.  She not able to do pulmonary rehab because of the back pain.  She has had a Covid vaccine  Other issues lung nodules.  Her last CT scan of the chest was in July 2020.  She has waxing and waning.  She is willing to have another CT scan of the chest at the appropriate time interval.      CAT COPD Symptom & Quality of Life Score ([place]) 0 is no burden. 5 is highest burden 08/02/2017  10/31/2017  02/05/2018  06/27/2018 60+ # intentioal weight loss  Never Cough -> Cough all the time _0 No phlegm in chest -> Chest is full of phlegm 0 0 3 0  No chest tightness -> Chest feels very tight 1 0 3 0  No dyspnea for 1 flight stairs/hill -> Very dyspneic for 1 flight of stairs _1 No limitations for ADL at home -> Very limited with ADL at home 1  0 5 3  Confident leaving home -> Not at all confident leaving home 0 0 3 1  Sleep soundly -> Do not sleep  soundly because of lung condition 4 4 3 3  $ [employer] -> No energy at all 2 3 5 4  $ TOTAL Score (max 40)  14 13 31 16   $ CAT Score 01/22/2020  Total CAT Score 9      ROS - per HPI    OV 07/14/2020  Subjective:  Patient ID: Yeraldi Alfonse [name], female , DOB: 1946-02-10 , age 59 y.o. , MRN: [id number] , ADDRESS: [address] [place] [address]  PCP Ria Bush, MD    07/14/2020 -   Chief Complaint  Patient presents with   Follow-up    no complaints    Advanced COPD on Spiriva and Breo and Singulair Multiple lung nodules with waxing and waning quality -last CT scan July 2020  HPI [name] 76 y.o. -presents for follow-up of her COPD.  Last seen in early part of this year.  She is supposed to have a follow-up CT scan of the chest but this has not happened.  Patient tells me that she wants to hold off on getting the CT scan of the chest right now but will do it at the time of follow-up.  Last CT scan of the chest was in July 2020.  At this point in time she continues on Spiriva, Breo and Singulair.  She feels well.  She has been social distancing.  She has had her Covid vaccine but will have a booster next week.  She will have a flu shot today but at the Coumadin clinic.  There are no active complaints.  She is interested in consolidating her Spiriva and Breo and to Trelegy.  She denies any recent exacerbations.   CAT Score 07/14/2020 01/22/2020  Total CAT Score 6 9     No flowsheet data found.   No flowsheet data found.      OV 02/14/2022  Subjective:  Patient ID: [name], female , DOB: 25-Nov-1945 , age 32 y.o. , MRN: [id number] , ADDRESS: [place] [id number] PCP Ria Bush, MD Patient Care Team: Ria Bush, MD as PCP - General (Family Medicine) Sanda Klein, MD as PCP - Cardiology (Cardiology) [name], MD as Consulting Physician (Gastroenterology) Kyung Rudd, MD as Consulting  Physician (Radiation Oncology) Nira Retort, MD as Consulting Physician (Hematology and Oncology) Terance Ice, MD (Inactive) as Consulting Physician (Cardiology) Brand Males, MD as Consulting Physician (Pulmonary Disease) Stark Klein, MD (General Surgery) Sanda Klein, MD as Consulting Physician (Cardiology) Shon Hough, MD as Consulting Physician (Ophthalmology) Martinique, Amy, MD as Consulting Physician (Dermatology) Pieter Partridge, DO as Consulting Physician (Neurology)  This Provider for this visit: Treatment Team:  Attending Provider: Brand Males, MD    02/14/2022 -   Chief Complaint  Patient presents with   Follow-up    Pt is here following up after recent CT. Pt states she has been doing okay since last visit.   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 Multiple lung nodules with waxing and waning quality  -last CT scanApril 2023 and Nov 2022  COVID-19 in February 2023  HPI [name] 76 y.o. -presents for follow-up to discuss her CT results.  Shortly after the CT scan was done she was alarmed with the results.  She is really  worried she has cancer.  Did discuss with her the option of coming here for follow-up today versus getting a surgical appointment.  She opted to take surgical appointment.  I reviewed the CT scan visualized with her.  This looks like waxing and waning nodules.  In between February 2020 she also had COVID-19.  The images are not classic for COVID but did indicate to her the infiltrates could be worse because of COVID.  She has appointment Dr. Roxan Hockey tomorrow.  She is really worried about malignancy.  I did indicate to her that the nodules may not be amenable for biopsy or she might be considered high risk for biopsy.  We discussed the option of canceling appointment Dr. Roxan Hockey tomorrow but she is decided to keep it.  Discussed doing some work-up for the  nodules that are noninvasive such as checking for immunoglobulins and autoimmune profile and QuantiFERON gold.  She is open to this idea.  She is very anxious about this illness because she does live alone.  Husband passed away and one of her sons passed away.  She has another son who is involved in her care  Overall from a symptom standpoint she feels well.  She takes her inhalers and nebulizers correctly.   She is also worried about the 4.2 cm ascending thoracic aortic aneurysm.  She believes this is new.  I did indicate that she could discussed with Dr. Roxan Hockey but did indicate that according to radiology has been stable.  I believe her cardiologist follows her for this.    CT Chest data 01/20/22  Narrative & Impression  CLINICAL DATA:  Pulmonary nodules. COPD. Bronchitis. Personal history of rectal cancer in 2012.   EXAM: CT CHEST WITHOUT CONTRAST   TECHNIQUE: Multidetector CT imaging of the chest was performed following the standard protocol without IV contrast.   RADIATION DOSE REDUCTION: This exam was performed according to the departmental dose-optimization program which includes automated exposure control, adjustment of the mA and/or kV according to patient size and/or use of iterative reconstruction technique.   COMPARISON:  08/16/2021   FINDINGS: Cardiovascular: Dual lead pacer noted. Coronary, aortic arch, and branch vessel atherosclerotic vascular disease. Marked mitral valve calcification. Substantial aortic valve calcification. Prosthetic mitral and aortic valves. Mild cardiomegaly.   Ascending aortic aneurysm 4.2 cm in diameter on image 83 series 2, stable. There is also a prominent main pulmonary artery suggesting pulmonary arterial hypertension.   Mediastinum/Nodes: Lower right paratracheal lymph node 1.1 cm in short axis on image 64 series 2 (mildly enlarged), formerly 0.9 cm.   Lungs/Pleura: Trace right pleural effusion similar to prior. Right greater  than left apical pleuroparenchymal scarring. Similar appearance of confluent bandlike density anteriorly in the right upper lobe on image 55 series 3. Scattered tree-in-bud nodularity anteriorly in both upper lobes favoring atypical infectious bronchiolitis. New 5 by 4 mm right middle lobe nodule on image 99 series 3. New clustered bandlike nodularity inferiorly in the right middle lobe, image 118 series 3.   New bandlike densities and nodularity in both lower lobes along the diaphragms as shown on image 118 series 3. Most of this has a complex bandlike confluent morphology although there are some smaller areas of nodularity. A 7 by 5 mm right lower lobe nodule on image 121 of series 3 was previously 5 by 5 mm.   Continued bandlike scarring in the lingula. New branching nodularity in the superior segment left lower lobe.   Cylindrical bronchiectasis in the right lower lobe.  Upper Abdomen: Abdominal aortic and systemic atherosclerotic calcification. Clips along the gallbladder fossa suggest prior cholecystectomy.   Musculoskeletal: Right rib deformities from old healed rib fractures. Thoracic spondylosis with multilevel bridging spurring. Chronic anterior wedge compression fracture at L1 especially along the inferior endplate.   IMPRESSION: 1. Generally worsened scattered nodularity and bandlike opacities in the lungs superimposed on a background of scattered tree-in-bud reticulonodular opacities. 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 In this setting of scattered nodularity, high specificity with regard to malignant versus infectious etiology of any individual nodule is not feasible on noncontrast CT. Surveillance is suggested. 2. Marked atherosclerosis. 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 Mild cardiomegaly. 3. Ascending thoracic aortic aneurysm 4.2 cm in diameter,  stable. Recommend annual imaging followup by CTA or MRA. This recommendation follows 2010 ACCF/AHA/AATS/ACR/ASA/SCA/SCAI/SIR/STS/SVM Guidelines for the Diagnosis and Management of Patients with Thoracic Aortic Disease. Circulation. 2010; 121: Y185-U314. Aortic aneurysm NOS (ICD10-I71.9) 4. Stable trace right pleural effusion. 5. Prominent main pulmonary artery compatible pulmonary arterial hypertension. 6. Cylindrical bronchiectasis in the right lower lobe.     Electronically Signed   By: Van Clines [name]   On: 01/21/2022 08:41        OV 05/24/2022  Subjective:  Patient ID: [name], female , DOB: [birth year] , age 76 y.o. , MRN: [id number] , ADDRESS: [place] [id number] PCP Ria Bush, MD Patient Care Team: Ria Bush, MD as PCP - General (Family Medicine) Sanda Klein, MD as PCP - Cardiology (Cardiology) [name], MD as Consulting Physician (Gastroenterology) Kyung Rudd, MD as Consulting Physician (Radiation Oncology) Nira Retort, MD as Consulting Physician (Hematology and Oncology) Terance Ice, MD (Inactive) as Consulting Physician (Cardiology) Brand Males, MD as Consulting Physician (Pulmonary Disease) Stark Klein, MD (General Surgery) Sanda Klein, MD as Consulting Physician (Cardiology) Shon Hough, MD as Consulting Physician (Ophthalmology) Martinique, Amy, MD as Consulting Physician (Dermatology) Pieter Partridge, DO as Consulting Physician (Neurology)  This Provider for this visit: Treatment Team:  Attending Provider: Brand Males, MD  Advanced COPD on Spiriva and Breo and Singulair   -Has associated right lower lobe bronchiectasis. Multiple lung nodules with waxing and waning quality  -last CT scanApril 2023 and Nov 2022  COVID-19 in February 2023  Strongly positive ANA in spring 2023   05/24/2022 -   Chief Complaint  Patient presents with   Follow-up    Pt is  here today to discuss results of recent CT.     HPI [name] 76 y.o. -waxing and waning pulmonary nodules she had another CT scan that shows the same.  Currently feels relatively stable.  Respiratory status is stable for her COPD.  She did see Dr. Roxan Hockey May 2023 and he felt biopsy would be nondiagnostic and recommended serial expectant approach.  She did have a most recent CT scan as documented below and it shows continued waxing and waning nodules.  Incidentally she was found to have positive ANA when I tested her for etiology of the nodules.  With strongly positive at 1: 1280.  She has upcoming appointment Dr. Vernelle Emerald in rheumatology.  I have asked her to keep it.  Indicated to her the nodules could be related to the positive ANA but the positive ANA could also be post-COVID.     PFT     Latest Ref Rng & Units 03/13/2018   10:36 AM 04/01/2016   10:10 AM 11/29/2013  4:04 PM  PFT Results  FVC-Pre L 2.21  2.22  1.49   FVC-Predicted Pre % 70  68  42   FVC-Post L  2.33  1.60   FVC-Predicted Post %  72  45   Pre FEV1/FVC % % 64  61  63   Post FEV1/FCV % %  63  65   FEV1-Pre L 1.42  1.35  0.93   FEV1-Predicted Pre % 59  55  34   FEV1-Post L  1.47  1.05   DLCO uncorrected ml/min/mmHg 24.55  19.21    DLCO UNC% % 91  71    DLCO corrected ml/min/mmHg 22.70     DLCO COR %Predicted % 84     DLVA Predicted % 102  95    TLC L  5.00    TLC % Predicted %  93    RV % Predicted %  121      CT chest 8/4?23  Narrative & Impression  CLINICAL DATA:  Multiple lung nodules.   EXAM: CT CHEST WITHOUT CONTRAST   TECHNIQUE: Multidetector CT imaging of the chest was performed following the standard protocol without IV contrast.   RADIATION DOSE REDUCTION: This exam was performed according to the departmental dose-optimization program which includes automated exposure control, adjustment of the mA and/or kV according to patient size and/or use of iterative  reconstruction technique.   COMPARISON:  CT 01/20/2022   FINDINGS: Cardiovascular: Coronary artery calcification and aortic atherosclerotic calcification. Dense aortic and mitral valvular calcification. Valvular prosthetics. No pericardial fluid. Midline sternotomy   Mediastinum/Nodes: No axillary or supraclavicular adenopathy. No mediastinal or hilar adenopathy. No pericardial fluid. Esophagus normal.   Lungs/Pleura: Branching tree-in-bud pattern in the medial RIGHT middle lobe and medial lingula unchanged. Nodule in the RIGHT upper lobe anterior to the fissure measuring 7 mm (68/series 3) is new from prior. Interval resolution of previous seen nodule in the apical segment of the LEFT lower lobe.   New nodule in the RIGHT lower lobe measuring 5 mm image 95/3). Resolution of peripheral nodule in the RIGHT lower lobe seen on comparison exam measuring 7 mm that time   Upper Abdomen: Limited view of the liver, kidneys, pancreas are unremarkable. Normal adrenal glands.   Musculoskeletal: No aggressive osseous lesion.   IMPRESSION: 1. Waxing waning bilateral pulmonary nodules suggest chronic pulmonary infection/atypical infection. 2. Branching nodular pattern within the medial upper lobes also consistent with atypical pulmonary infection. 3. Dense valvular calcifications with valvular prosthetics.     Electronically Signed   By: Suzy Bouchard [name]   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), Acute cystitis without hematuria (12/22/2015), Acute right-sided low back pain without sciatica (11/26/2019), Adjustment disorder with depressed mood (10/15/2014), Anemia, Anticoagulant long-term use (06/14/2019), Aortic atherosclerosis (03/03/2022), Arthritis, Arthropathy of spinal facet joint (03/08/2018), Ascending aortic aneurysm (03/02/2021), Bilateral hip pain (09/08/2016), CAP (community acquired pneumonia) (02/08/2016),  Carotid stenosis (12/08/2020), Cataracts, bilateral, Chest pain (08/25/2014), Chronic cholecystitis with calculus (01/21/2015), Chronic diastolic heart failure (27/78/2423), Chronic insomnia, Chronic lower back pain (03/05/2008), CKD (chronic kidney disease) stage 2, GFR 60-89 ml/min (07/03/2015), Constipation, COPD (chronic obstructive pulmonary disease), DDD (degenerative disc disease), DISH (diffuse idiopathic skeletal hyperostosis) (05/14/2019), Dyspnea on exertion (06/05/2015), Essential hypertension, benign (03/24/2009), GERD (gastroesophageal reflux disease), Hematuria (06/17/2021), History of radiation therapy (05/30/11 to 07/07/11), History of rectal cancer (05/26/2011), History of shingles, History of vertebral fracture (11/26/2019), HLD (hyperlipidemia) (05/03/2007), Hyperlipemia, Lacunar infarction, Left hip  pain (07/29/2020), Legally blind in left eye, Lung nodule, 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), Macrocytosis (03/27/2022), Malaise and fatigue (01/22/2018), Meralgia paresthetica (12/13/2007), 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), Obesity, Osteopenia (12/2014), Other spondylosis with radiculopathy, lumbar region (07/17/2018), Pain of right sacroiliac joint (08/09/2017), Pancreatitis (11/2014), Peripheral neuropathy (01/03/2022), Persistent atrial fibrillation (12/09/2020), Personal history of colonic polyps, PONV (postoperative nausea and vomiting), Presence of permanent cardiac pacemaker, 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), 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), Pulmonary artery hypertension (06/17/2021), Pulmonary infiltrates (04/23/2019), Pulmonary nodule (02/23/2012), Rheumatic heart disease mitral stenosis, Right leg swelling (06/11/2020), 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   reports that she quit smoking about 47 years ago. Her smoking use included cigarettes. She has a 15.00 pack-year smoking history. She has never used smokeless tobacco.  Past Surgical History:  Procedure Laterality Date   ANTERIOR LAT LUMBAR FUSION Left 07/17/2018   Procedure: Lumbar Two-Three Lumbar Three-Four Anterolateral decompression/interbody fusion with lateral plate fixation/Infuse;  Surgeon: Kristeen Miss, MD;  Location: [facility];  Service: Neurosurgery;  Laterality: Left;  Lumbar Two-Three Lumbar Three-Four Anterolateral decompression/interbody fusion with lateral plate fixation/Infuse   AORTIC VALVE REPLACEMENT N/A 12/12/2013   Procedure: AORTIC VALVE REPLACEMENT (AVR);  Surgeon: Gaye Pollack, MD; Service: Open Heart Surgery   BACK SURGERY  2006, 2007   2006 SPACER, 2007 decompression and fusion L4/5   BOWEL RESECTION  04/02/2012   Procedure: SMALL BOWEL RESECTION;  Surgeon: Stark Klein, MD;  Location: [facility];  Service: General;  Laterality: N/A;   BRONCHIAL WASHINGS  03/24/2021   Procedure: BRONCHIAL WASHINGS;  Surgeon: Brand Males, MD;  Location: [facility];  Service: Endoscopy;;   CARPAL TUNNEL RELEASE Bilateral    CATARACT EXTRACTION W/ INTRAOCULAR LENS IMPLANT Left 10/18/2016   CATARACT EXTRACTION W/ INTRAOCULAR LENS IMPLANT Right 12/13/2016   Dr. Kathrin Penner   CHOLECYSTECTOMY N/A 01/21/2015   chronic cholecystitis, Stark Klein, MD   COLON RESECTION  08/25/2011   Procedure: COLON RESECTION LAPAROSCOPIC;  Surgeon: Stark Klein, MD;  Location: [facility];  Service: General;  Laterality: N/A;  Laparoscopic Assisted Low Anterior Resection Diverting Ostomy and onQ pain pump   COLONOSCOPY  03/2013   1 polyp, rpt 3 yrs Ardis Hughs)   COLONOSCOPY   05/2021   diverticulosis and patent colon anastomosis with some edema/narrowing, rpt 5 yrs Ardis Hughs)   COLONOSCOPY WITH PROPOFOL N/A 06/09/2016   patent colo-colonic anastomosis, rpt 5 yrs Ardis Hughs)   EP IMPLANTABLE DEVICE N/A 06/16/2016   Procedure: Pacemaker Implant;  Surgeon: Will Meredith Leeds, MD;  Location: [facility];  Service: Cardiovascular;  Laterality: N/A;   FOOT SURGERY  left foot   hammer toe   ILEOSTOMY  08/25/2011   ILEOSTOMY CLOSURE  04/02/2012   Procedure: ILEOSTOMY TAKEDOWN;  Surgeon: Stark Klein, MD;  Location: [facility];  Service: General;  Laterality: N/A;   INTRAOPERATIVE TRANSESOPHAGEAL ECHOCARDIOGRAM N/A 12/12/2013   Procedure: INTRAOPERATIVE TRANSESOPHAGEAL ECHOCARDIOGRAM;  Surgeon: Gaye Pollack, MD;  Location: [facility];  Service: Open Heart Surgery;  Laterality: N/A;   LEFT AND RIGHT HEART CATHETERIZATION WITH CORONARY ANGIOGRAM N/A 11/27/2013   Procedure: LEFT AND RIGHT HEART CATHETERIZATION WITH CORONARY ANGIOGRAM;  Surgeon: Sanda Klein, MD;  Location: [facility];  Service: Cardiovascular;  Laterality: N/A;  MITRAL VALVE REPLACEMENT N/A 12/12/2013   Procedure: MITRAL VALVE (MV) REPLACEMENT OR REPAIR;  Surgeon: Gaye Pollack, MD; Service: Open Heart Surgery   TEE WITHOUT CARDIOVERSION N/A 11/29/2013   Procedure: TRANSESOPHAGEAL ECHOCARDIOGRAM (TEE);  Surgeon: Sueanne Margarita, MD;  Location: [facility];  Service: Cardiovascular;  Laterality: N/A;   TEE WITHOUT CARDIOVERSION N/A 06/04/2019   Procedure: TRANSESOPHAGEAL ECHOCARDIOGRAM (TEE);  Surgeon: Sanda Klein, MD;  Location: [facility];  Service: Cardiovascular;  Laterality: N/A;   TONSILLECTOMY     VIDEO BRONCHOSCOPY N/A 03/24/2021   Procedure: VIDEO BRONCHOSCOPY WITHOUT FLUORO;  Surgeon: Brand Males, MD;  Location[facility];  Service: Endoscopy;  Laterality: N/A;  SMALL SCOPE,PREMEDICATE WITH NEBULIZED LIDOCAINE    Allergies  Allergen Reactions   Codeine Nausea Only   Sulfa  Antibiotics Nausea Only   Sulfonamide Derivatives Nausea Only    Immunization History  Administered Date(s) Administered   Fluad Quad(high Dose 65+) 07/11/2019, 07/14/2020   H1N1 09/17/2008   Influenza Split 07/18/2012   Influenza Whole 08/10/2005, 06/24/2009, 07/11/2011   Influenza, High Dose Seasonal PF 07/04/2016, 07/04/2017, 07/27/2021   Influenza,inj,Quad PF,6+ Mos 07/09/2018   Influenza-Unspecified 07/24/2013, 06/30/2014, 06/30/2015   Moderna SARS-COV2 Booster Vaccination 08/24/2020   Moderna Sars-Covid-2 Vaccination 02/26/2021   PFIZER(Purple Top)SARS-COV-2 Vaccination 10/30/2019, 11/20/2019   Pneumococcal Conjugate-13 06/02/2014   Pneumococcal Polysaccharide-23 08/10/2005, 04/21/2008, 06/25/2013   Td 10/11/2005   Zoster Recombinat (Shingrix) 06/25/2018, 08/28/2018   Zoster, Live 09/16/2014    Family History  Problem Relation Age of Onset   Heart disease Mother    Diabetes Mother    Hypertension Mother    Stroke Mother    Lung cancer Father    Breast cancer Maternal Aunt 83   Heart attack Maternal Grandfather    CAD Son 10   Heart attack Son 13   Rectal cancer Neg Hx    Stomach cancer Neg Hx    Esophageal cancer Neg Hx    Colon cancer Neg Hx      Current Outpatient Medications:    acetaminophen (TYLENOL) 500 MG tablet, Take 2 tablets (1,000 mg total) by mouth daily., Disp: , Rfl:    albuterol (VENTOLIN HFA) 108 (90 Base) MCG/ACT inhaler, Inhale 2 puffs into the lungs every 6 (six) hours as needed for wheezing., Disp: 18 g, Rfl: 6   apixaban (ELIQUIS) 5 MG TABS tablet, Take 1 tablet (5 mg total) by mouth 2 (two) times daily., Disp: 60 tablet, Rfl: 11   Biotin 1000 MCG tablet, Take 1,000 mcg by mouth daily., Disp: , Rfl:    calcium carbonate (OSCAL) 1500 (600 Ca) MG TABS tablet, Take 600 mg of elemental calcium by mouth daily., Disp: , Rfl:    carboxymethylcellulose (REFRESH PLUS) 0.5 % SOLN, Place 1 drop into both eyes 3 (three) times daily as needed (dry eyes).,  Disp: , Rfl:    cephALEXin (KEFLEX) 250 MG capsule, Take 250 mg by mouth daily., Disp: , Rfl:    Cholecalciferol (VITAMIN D) 50 MCG (2000 UT) tablet, Take 2,000 Units by mouth daily., Disp: , Rfl:    docusate sodium (COLACE) 100 MG capsule, Take 200 mg by mouth every evening., Disp: , Rfl:    Fluticasone-Umeclidin-Vilant (TRELEGY ELLIPTA) 200-62.5-25 MCG/ACT AEPB, INHALE 1 PUFF INTO THE LUNGS DAILY, Disp: 60 each, Rfl: 11   furosemide (LASIX) 40 MG tablet, Take 1 tablet (40 mg total) by mouth daily. May take an extra tablet daily as needed for swelling., Disp: 45 tablet, Rfl: 11   gabapentin (NEURONTIN) 300  MG capsule, Take 300 mg by mouth at bedtime., Disp: , Rfl:    ipratropium-albuterol (DUONEB) 0.5-2.5 (3) MG/3ML SOLN, Take 3 mLs by nebulization every 6 (six) hours as needed., Disp: 360 mL, Rfl: 0   lovastatin (MEVACOR) 40 MG tablet, TAKE 1 TABLET(40 MG) BY MOUTH AT BEDTIME, Disp: 90 tablet, Rfl: 1   montelukast (SINGULAIR) 10 MG tablet, TAKE 1 TABLET(10 MG) BY MOUTH AT BEDTIME, Disp: 90 tablet, Rfl: 1   Multiple Vitamins-Minerals (PRESERVISION AREDS 2 PO), Take 2 capsules by mouth every evening. , Disp: , Rfl:    polyethylene glycol (MIRALAX / GLYCOLAX) packet, Take 17 g by mouth daily., Disp: 14 each, Rfl: 0   potassium chloride (KLOR-CON) 10 MEQ tablet, TAKE 2 TABLETS(20 MEQ) BY MOUTH DAILY, Disp: 180 tablet, Rfl: 3   silver sulfADIAZINE (SILVADENE) 1 % cream, Apply 1 application topically daily., Disp: 50 g, Rfl: 1   traMADol (ULTRAM) 50 MG tablet, TAKE 1 TABLET(50 MG) BY MOUTH EVERY 12 HOURS AS NEEDED FOR MODERATE PAIN, Disp: 40 tablet, Rfl: 0   traZODone (DESYREL) 50 MG tablet, TAKE 1/2 TO 1 TABLET(25 TO 50 MG) BY MOUTH AT BEDTIME, Disp: 30 tablet, Rfl: 2   vitamin B-12 (CYANOCOBALAMIN) 500 MCG tablet, Take 1 tablet (500 mcg total) by mouth 2 (two) times a week., Disp: , Rfl:    Zinc 50 MG CAPS, Take 1 capsule (50 mg total) by mouth daily., Disp: , Rfl: 0 No current facility-administered  medications for this visit.  Facility-Administered Medications Ordered in Other Visits:    ondansetron (ZOFRAN) 4 mg in sodium chloride 0.9 % 50 mL IVPB, 4 mg, Intravenous, Q6H PRN, Kristeen Miss, MD      Objective:   Vitals:   05/24/22 1415  BP: 112/62  Pulse: 93  SpO2: 96%  Weight: 149 lb 3.2 oz (67.7 kg)  Height: '5\' 4"'$  (1.626 m)    Estimated body mass index is 25.61 kg/m as calculated from the following:   Height as of this encounter: '5\' 4"'$  (1.626 m).   Weight as of this encounter: 149 lb 3.2 oz (67.7 kg).  '@WEIGHTCHANGE'$ @  [employer]   05/24/22 1415  Weight: 149 lb 3.2 oz (67.7 kg)     Physical Exam loo General: No distress. Looks wwll. Thin Neuro: Alert and Oriented x 3. GCS 15. Speech normal Psych: Pleasant Resp:  Barrel Chest - no.  Wheeze - no, Crackles - no, No overt respiratory distress CVS: Normal heart sounds. Murmurs - no Ext: Stigmata of Connective Tissue Disease - no HEENT: Normal upper airway. PEERL +. No post nasal drip        Assessment:       ICD-10-CM   1. Moderate COPD (chronic obstructive pulmonary disease) (HCC)  J44.9     2. ANA positive  R76.8     3. Multiple lung nodules on CT  R91.8          Plan:     Patient Instructions  Moderate COPD (chronic obstructive pulmonary disease) ([place])  - stable with minimal symptmns  Plan  - continue current bronchodilatory regimen and medicines  Multiple lung nodules on CT with RLL bronchiectasis Pseudomonas aeruginosa colonization - June 2022 History of covid-19 in Feb 2023 Strongly positive ANA test 2023   - the CT features April 2023 are worse than Nov 2022 and in August 2023 to have waxing and waning quality. -No indication for biopsy as determined by Dr. Roxan Hockey May 2023 -This could be low-grade infection or could  even be autoimmune based on significantly elevated ANA -Features do not fit in with cancer  Plan = Keep appointment with rheumatology for positive ANA -We  will determine timing of next CT scan of the chest at the time of next follow-up -Consider flu vaccine, RSV vaccine and COVID mRNA booster in the fall 2023      Follow-up - Return to see Dr. [name] in 6 months or sooner if needed    SIGNATURE    Dr. Brand Males, [name], F.C.C.P,  Pulmonary and Critical Care Medicine Staff Physician, [facility] Director - Interstitial Lung Disease  Program  P[facility] [place] [facility] [place], [place], [address]  Pager: [phone], If no answer or between  15:00h - 7:00h: call [phone] Telephone: [phone]  6:32 PM 05/24/2022

## 2022-05-24 NOTE — Telephone Encounter (Signed)
ERx 

## 2022-05-30 ENCOUNTER — Ambulatory Visit: Payer: Medicare Other | Attending: Internal Medicine | Admitting: Internal Medicine

## 2022-05-30 ENCOUNTER — Encounter: Payer: Self-pay | Admitting: Internal Medicine

## 2022-05-30 VITALS — BP 119/70 | HR 76 | Resp 14 | Ht 65.0 in | Wt 152.4 lb

## 2022-05-30 DIAGNOSIS — M4810 Ankylosing hyperostosis [Forestier], site unspecified: Secondary | ICD-10-CM

## 2022-05-30 DIAGNOSIS — R768 Other specified abnormal immunological findings in serum: Secondary | ICD-10-CM

## 2022-05-30 DIAGNOSIS — R918 Other nonspecific abnormal finding of lung field: Secondary | ICD-10-CM

## 2022-05-30 NOTE — Progress Notes (Signed)
Office Visit Note  Patient: [name]             Date of Birth: 1946/09/05           MRN: [id number]             PCP: Ria Bush, MD Referring: Brand Males, MD Visit Date: 05/30/2022 Occupation: Retired, office work  Subjective:  New Patient (Initial Visit) (Referred by PCP, due to positive ANA result.)   History of Present Illness: [name] is a 76 y.o. female here for evaluation of positive ANA and positive rheumatoid factor checked in association with multiple chronic pulmonary nodules.  She has history of COPD and also underwent bronchioloalveolar lavage during exacerbation in summer of last year findings consistent with chronic Pseudomonas aeruginosa colonization.  She has appears to be mild or moderate disease has been stable with her inhaler therapy.  She had concern about malignancy due to family history that her father had lung cancer.  Repeat imaging has shown waxing and waning adenopathy and distribution most suggestive for chronic or atypical infection.  She has never had lung nodule biopsy Case was reviewed by Dr. Roxan Hockey and thought to be atypical or low risk for malignancy. She has extensive chronic arthritis issues with multilevel degenerative disease in her back.  She has required previous lumbar spine surgery also has findings of DISH with some gradual progression in thoracic kyphosis.  She has some joint pain and stiffness in other areas often involving hips does not notice peripheral joint swelling.  She does have fluid retention in her feet and ankles.  She has some chronic congestive heart failure with preserved ejection fraction history of previous aortic and mitral valve replacement also has mild stable ascending aortic aneurysm monitored by cardiology and implanted pacemaker device.  She does have chronic fatigue and decreased exertion tolerance but feels like her walking mobility is more limited by joint pain especially in the back  then by shortness of breath.   Labs reviewed ANA 1:1280 homogenous dsDNA neg IgA 447 RF 29 CCP neg TB neg   Activities of Daily Living:  Patient reports morning stiffness for 15 minutes.   Patient Denies nocturnal pain.  Difficulty dressing/grooming: Denies Difficulty climbing stairs: Denies Difficulty getting out of chair: Denies Difficulty using hands for taps, buttons, cutlery, and/or writing: Reports  Review of Systems  Constitutional:  Positive for fatigue.  HENT:  Negative for mouth sores and mouth dryness.   Eyes:  Negative for dryness.  Respiratory:  Positive for shortness of breath.   Cardiovascular:  Negative for chest pain and palpitations.  Gastrointestinal:  Negative for blood in stool, constipation and diarrhea.  Endocrine: Positive for increased urination.  Genitourinary:  Positive for involuntary urination.  Musculoskeletal:  Positive for joint pain, gait problem, joint pain, joint swelling and morning stiffness. Negative for myalgias, muscle weakness, muscle tenderness and myalgias.  Skin:  Positive for color change. Negative for rash, hair loss and sensitivity to sunlight.  Allergic/Immunologic: Negative for susceptible to infections.  Neurological:  Positive for dizziness. Negative for headaches.  Hematological:  Negative for swollen glands.  Psychiatric/Behavioral:  Positive for depressed mood and sleep disturbance. The patient is nervous/anxious.     PMFS History:  Patient Active Problem List   Diagnosis Date Noted   Positive ANA (antinuclear antibody) 05/30/2022   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   Lacunar infarction ([place])    Macrocytosis 03/27/2022   Aortic atherosclerosis 03/03/2022   Peripheral neuropathy 01/03/2022  Hematuria 06/17/2021   Pulmonary artery hypertension 06/17/2021   Pacemaker 03/02/2021   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   Ascending aortic aneurysm ([place]) 03/02/2021   Lymphangioma 01/07/2021   Persistent  atrial fibrillation 12/09/2020   Carotid stenosis 12/08/2020   Left hip pain 07/29/2020   Right leg swelling 06/11/2020   History of vertebral fracture 11/26/2019   Acute right-sided low back pain without sciatica 11/26/2019   Thrombocytopenia 11/04/2019   Anticoagulant long-term use 06/14/2019   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   Typical atrial flutter 05/15/2019   DISH (diffuse idiopathic skeletal hyperostosis) 05/14/2019   Pulmonary infiltrates 04/23/2019   Other spondylosis with radiculopathy, lumbar region 07/17/2018   Syncope 05/28/2018   Arthropathy of spinal facet joint 03/08/2018   Malaise and fatigue 01/22/2018   Pain of right sacroiliac joint 08/09/2017   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 12/31/2016   Vitamin D deficiency 10/23/2016   Bilateral hip pain 09/08/2016   Personal history of colonic polyps    CKD (chronic kidney disease) stage 2, GFR 60-89 ml/min 07/03/2015   Dyspnea on exertion 06/05/2015   Mixed stress and urge urinary incontinence 03/16/2015   Vitamin B12 deficiency 02/28/2015   Chronic insomnia 02/25/2015   Osteopenia 12/09/2014   Adjustment disorder with depressed mood 11-01-2014   Chronic diastolic heart failure 67/09/4579   Chest pain 08/25/2014   S/P aortic and mitral valve bioprostheses 12/2013   Pulmonary nodules 02/23/2012   History of radiation therapy    History of rectal cancer 05/26/2011   Essential hypertension, benign 03/24/2009   Overweight (BMI 25.0-29.9) 03/05/2008   Chronic lower back pain 03/05/2008   Meralgia paresthetica 12/13/2007   GERD (gastroesophageal reflux disease) 08/06/2007   HLD (hyperlipidemia) 05/03/2007   COPD (chronic obstructive pulmonary disease) 05/03/2007    Past Medical History:  Diagnosis Date   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 12/31/2016   Acute cystitis without hematuria 12/22/2015   Acute right-sided low back pain without sciatica 11/26/2019   Saw [facility] 12/2019 - planned L2/3 translaminar  epidural steroid injection. Known DISH with thoracic spine fusion and moderate kyphosis, h/o L spine fusion L2-5   Adjustment disorder with depressed mood November 01, 2014   45 yo son died MI Oct 06, 2014 Husband died car accident 2015/04/07   Anemia    Anticoagulant long-term use 06/14/2019   She takes warfarin for afib/flutter with CHADS2VASc score of 4 (age, sex, CHF, HTN), s/p 2 bioprosthetic valve replacements Coumadin changed to eliquis 2022 per cardiology   Aortic atherosclerosis 03/03/2022   Noted on 01/2022 chest CT   Arthritis    Arthropathy of spinal facet joint 03/08/2018   Ascending aortic aneurysm 03/02/2021   4.1cm on CT 01/2021 rec yearly monitoring   Bilateral hip pain 09/08/2016   CAP (community acquired pneumonia) 02/08/2016   Carotid stenosis 12/08/2020   Carotid US - 40-59% BICA stenosis (2015) Carotid US 12/2020 - 1-39% BICA stenosis, incidental 3.4cm structure behind L common carotid artery rec further imaging    Cataracts, bilateral    immature   Chest pain 08/25/2014   myoview low risk 02/2018 ([place])   Chronic cholecystitis with calculus 01/21/2015   S/p cholecystectomy    Chronic diastolic heart failure 99/83/3825   Chronic insomnia    Chronic lower back pain 03/05/2008   Returned to Dr Ellene Route - translaminar ESI at L3/4. Myelogram showed significant left sided foraminal stenosis at L2/3 and L3/4 in setting of solid arthrodesis, planned DEXA and if stable osteopenia, considering anterolateral decompression with spacer.    CKD (chronic  kidney disease) stage 2, GFR 60-89 ml/min 07/03/2015   Constipation    takes Miralax daily as needed   COPD (chronic obstructive pulmonary disease)    Albuterol inhaler daily as needed;Duoneb daily as needed;Spiriva daily   DDD (degenerative disc disease)    cervical - kyphosis with mod DD changes C5/6 and C6/7; lumbar - early DD at L2/3 ([facility])   DISH (diffuse idiopathic skeletal hyperostosis) 05/14/2019   By MRI noted fusion of lower  thoracic vertebrae - likely contributing to unsteadiness ([facility])   Dyspnea on exertion 06/05/2015   myoview low risk 02/2018   Essential hypertension, benign 03/24/2009   GERD (gastroesophageal reflux disease)    takes Protonix daily   Hematuria 06/17/2021   History of radiation therapy 05/30/11 to 07/07/11   rectum   History of rectal cancer 05/26/2011   Rectal cancer 2012, midrectum ypT3ypN0, s/p neoadj chemo&XRT, lap LAR with diverting loop ileostomy 08/2011, ileostomy takedown 03/2012 Discharged from oncology clinic 2017 Benay Spice)   History of shingles    History of vertebral fracture 11/26/2019   Old L1 vertebral compression fracture incidentally noted on imaging 2020    HLD (hyperlipidemia) 05/03/2007   Hyperlipemia    takes Lovastatin daily   Lacunar infarction    Left caudate   Left hip pain 07/29/2020   Legally blind in left eye    Lung nodule    RLL nodule-35m stable 2006, April 2009, and June 2009   Lymphangioma 01/07/2021   Noted incidentally on carotid [place]/2022 CT neck - Likely benign lymphangioma 01/2021   Macrocytosis 03/27/2022   Folate and b12 checked 2023   Malaise and fatigue 01/22/2018   Meralgia paresthetica 12/13/2007   Mixed stress and urge urinary incontinence 03/16/2015   Failed oxybutynin IR/ER. Vesicare initially helped but then no longer helpful.  myrbetriq was not effective.  Saw urology (MacDiarmid) - UDS largely overactive bladder with mild-mod stress incontinence. 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   Pulmonary infiltrates 04/23/2019   06/13/2018-CT super D chest without contrast- generally stable chronic lung disease with pleural parenchymal scarring and scattered nodularity most of these nodules have been tree-in-bud in appearance likely postinfectious or inflammatory the dominant right lower lobe nodule seen on most recent study has resolved no new or enlarging nodules  04/22/2019-CT chest without contrast- waxing and waning per   Pulmonary nodule 02/23/2012   RLL nodule-651mstable 2006, April 2009, and June 2009 Small pulm nodules Jan 2012 - > Oct 2012 without change   Rheumatic heart disease mitral stenosis    mod MS by echo 02/2014  Right leg swelling 06/11/2020   R venous US 06/2020 WNL: - No evidence of deep vein thrombosis in the lower extremity. No indirect evidence of obstruction proximal to the inguinal ligament. - No cystic structure found in the popliteal fossa.   S/P aortic and mitral valve bioprostheses 12/08/2013   AVR 21 mm, MVR 25 mm-MagnaEase pericardia   S/P AVR (aortic valve replacement) 2015   bioprosthetic ([place])   S/P MVR (mitral valve replacement) 2015   bioprosthetic ([place])   Sepsis secondary to UTI ([place]) 08/22/2017   Severe aortic valve stenosis 10/01/2013   mild-mod by echo 02/2014   Skin lesion 01/03/2022   Small bowel obstruction, partial 08/2013   reolved without NGT placement.    Syncope 05/28/2018   Thrombocytopenia 11/04/2019   Typical atrial flutter  05/15/2019   Vitamin B12 deficiency 02/28/2015   Start B12 shots 02/2015    Vitamin D deficiency 10/23/2016    Family History  Problem Relation Age of Onset   Heart disease Mother    Diabetes Mother    Hypertension Mother    Stroke Mother    Lung cancer Father    Breast cancer Maternal Aunt 83   Heart attack Maternal Grandfather    CAD Son 24   Heart attack Son 85   Rectal cancer Neg Hx    Stomach cancer Neg Hx    Esophageal cancer Neg Hx    Colon cancer Neg Hx    Past Surgical History:  Procedure Laterality Date   ANTERIOR LAT LUMBAR FUSION Left 07/17/2018   Procedure: Lumbar Two-Three Lumbar Three-Four Anterolateral decompression/interbody fusion with lateral plate fixation/Infuse;  Surgeon: Kristeen Miss, MD;  Location: [place];  Service: Neurosurgery;  Laterality: Left;  Lumbar Two-Three Lumbar Three-Four Anterolateral decompression/interbody fusion with lateral plate fixation/Infuse   AORTIC VALVE REPLACEMENT N/A 12/12/2013   Procedure: AORTIC VALVE REPLACEMENT (AVR);  Surgeon: Gaye Pollack, MD; Service: Open Heart Surgery   BACK SURGERY  2006, 2007   2006 SPACER, 2007 decompression and fusion L4/5   BOWEL RESECTION  04/02/2012   Procedure: SMALL BOWEL RESECTION;  Surgeon: Stark Klein, MD;  Location: [facility];  Service: General;  Laterality: N/A;   BRONCHIAL WASHINGS  03/24/2021   Procedure: BRONCHIAL WASHINGS;  Surgeon: Brand Males, MD;  Location: [facility];  Service: Endoscopy;;   CARPAL TUNNEL RELEASE Bilateral    CATARACT EXTRACTION W/ INTRAOCULAR LENS IMPLANT Left 10/18/2016   CATARACT EXTRACTION W/ INTRAOCULAR LENS IMPLANT Right 12/13/2016   Dr. Kathrin Penner   CHOLECYSTECTOMY N/A 01/21/2015   chronic cholecystitis, Stark Klein, MD   COLON RESECTION  08/25/2011   Procedure: COLON RESECTION LAPAROSCOPIC;  Surgeon: Stark Klein, MD;  Location: [facility];  Service: General;  Laterality: N/A;  Laparoscopic Assisted Low Anterior Resection Diverting Ostomy and onQ pain  pump   COLONOSCOPY  03/2013   1 polyp, rpt 3 yrs Ardis Hughs)   COLONOSCOPY  05/2021   diverticulosis and patent colon anastomosis with some edema/narrowing, rpt 5 yrs Ardis Hughs)   COLONOSCOPY WITH PROPOFOL N/A 06/09/2016   patent colo-colonic anastomosis, rpt 5 yrs Ardis Hughs)   EP IMPLANTABLE DEVICE N/A 06/16/2016   Procedure: Pacemaker Implant;  Surgeon: Will Meredith Leeds, MD;  Location: [facility];  Service: Cardiovascular;  Laterality: N/A;   FOOT SURGERY  left foot   hammer toe   ILEOSTOMY  08/25/2011   ILEOSTOMY CLOSURE  04/02/2012   Procedure: ILEOSTOMY TAKEDOWN;  Surgeon: Stark Klein, MD;  Location: [facility];  Service: General;  Laterality: N/A;  INTRAOPERATIVE TRANSESOPHAGEAL ECHOCARDIOGRAM N/A 12/12/2013   Procedure: INTRAOPERATIVE TRANSESOPHAGEAL ECHOCARDIOGRAM;  Surgeon: Gaye Pollack, MD;  [facility];  Service: Open Heart Surgery;  Laterality: N/A;   LEFT AND RIGHT HEART CATHETERIZATION WITH CORONARY ANGIOGRAM N/A 11/27/2013   Procedure: LEFT AND RIGHT HEART CATHETERIZATION WITH CORONARY ANGIOGRAM;  Surgeon: Sanda Klein, MD;  Location: [place] [facility];  Service: Cardiovascular;  Laterality: N/A;   MITRAL VALVE REPLACEMENT N/A 12/12/2013   Procedure: MITRAL VALVE (MV) REPLACEMENT OR REPAIR;  Surgeon: Gaye Pollack, MD; Service: Open Heart Surgery   TEE WITHOUT CARDIOVERSION N/A 11/29/2013   Procedure: TRANSESOPHAGEAL ECHOCARDIOGRAM (TEE);  Surgeon: Sueanne Margarita, MD;  Location[facility];  Service: Cardiovascular;  Laterality: N/A;   TEE WITHOUT CARDIOVERSION N/A 06/04/2019   Procedure: TRANSESOPHAGEAL ECHOCARDIOGRAM (TEE);  Surgeon: Sanda Klein, MD;  Location: [place];  Service: Cardiovascular;  Laterality: N/A;   TONSILLECTOMY     VIDEO BRONCHOSCOPY N/A 03/24/2021   Procedure: VIDEO BRONCHOSCOPY WITHOUT FLUORO;  Surgeon: Brand Males, MD;  Location: [facility];  Service: Endoscopy;  Laterality: N/A;  SMALL SCOPE,PREMEDICATE WITH NEBULIZED LIDOCAINE    Social History   Social History Narrative   Widow. Husband died 2015/04/16 MVA. 1 cat.    2 sons; 1 grandchild. One son died of MI 54   Edu: Completed HS, some college   Married 1966   Retired-[employer], volunteers at [employer]   Activity: pulm rehab   Diet: some water, fruits/vegetables daily      Right handed   Caffeine: 4 glasses of tea and 1 diet coke a day   Immunization History  Administered Date(s) Administered   Fluad Quad(high Dose 65+) 07/11/2019, 07/14/2020   H1N1 09/17/2008   Influenza Split 07/18/2012   Influenza Whole 08/10/2005, 06/24/2009, 07/11/2011   Influenza, High Dose Seasonal PF 07/04/2016, 07/04/2017, 07/27/2021   Influenza,inj,Quad PF,6+ Mos 07/09/2018   Influenza-Unspecified 07/24/2013, 06/30/2014, 06/30/2015   Moderna SARS-COV2 Booster Vaccination 08/24/2020   Moderna Sars-Covid-2 Vaccination 02/26/2021   PFIZER(Purple Top)SARS-COV-2 Vaccination 10/30/2019, 11/20/2019   Pneumococcal Conjugate-13 06/02/2014   Pneumococcal Polysaccharide-23 08/10/2005, 04/21/2008, 06/25/2013   Td 10/11/2005   Zoster Recombinat (Shingrix) 06/25/2018, 08/28/2018   Zoster, Live 09/16/2014     Objective: Vital Signs: BP 119/70 (BP Location: Left Arm, Patient Position: Sitting, Cuff Size: Normal)   Pulse 76   Resp 14   Ht _0  (1.651 m)   Wt 152 lb 6.4 oz (69.1 kg)   BMI 25.36 kg/m    Physical Exam Cardiovascular:     Rate and Rhythm: Normal rate and regular rhythm.     Heart sounds: Murmur heard.  Pulmonary:     Effort: Pulmonary effort is normal.     Comments: Faint inspiratory crackles, good air movement Musculoskeletal:     Comments: 1+ pitting edema over both ankles, there is bruising on medial side of left ankle  Skin:    General: Skin is warm and dry.     Findings: Bruising present.  Neurological:     Mental Status: She is alert.  Psychiatric:        Mood and Affect: Mood normal.      Musculoskeletal Exam:  Shoulders full ROM no tenderness or  swelling Elbows full ROM no tenderness or swelling Wrists full ROM no tenderness or swelling Mild first CMC joint squaring and Heberden's nodes in DIP joints of both hands no tenderness or palpable swelling and range of motion intact There is increased thoracic kyphosis does have mild paraspinal muscle tenderness to pressure  bilaterally No focal tenderness over lateral hip no pain, some pain or tightness in posterior lateral portion of thigh provoked with internal rotation Knees with patellofemoral crepitus bilaterally, no instability or palpable effusions Ankles full ROM, overlying edema   Investigation: No additional findings.  Imaging: CT Chest Wo Contrast  Result Date: 05/13/2022 CLINICAL DATA:  Multiple lung nodules. EXAM: CT CHEST WITHOUT CONTRAST TECHNIQUE: Multidetector CT imaging of the chest was performed following the standard protocol without IV contrast. RADIATION DOSE REDUCTION: This exam was performed according to the departmental dose-optimization program which includes automated exposure control, adjustment of the mA and/or kV according to patient size and/or use of iterative reconstruction technique. COMPARISON:  CT 01/20/2022 FINDINGS: Cardiovascular: Coronary artery calcification and aortic atherosclerotic calcification. Dense aortic and mitral valvular calcification. Valvular prosthetics. No pericardial fluid. Midline sternotomy Mediastinum/Nodes: No axillary or supraclavicular adenopathy. No mediastinal or hilar adenopathy. No pericardial fluid. Esophagus normal. Lungs/Pleura: Branching tree-in-bud pattern in the medial RIGHT middle lobe and medial lingula unchanged. Nodule in the RIGHT upper lobe anterior to the fissure measuring 7 mm (68/series 3) is new from prior. Interval resolution of previous seen nodule in the apical segment of the LEFT lower lobe. New nodule in the RIGHT lower lobe measuring 5 mm image 95/3). Resolution of peripheral nodule in the RIGHT lower lobe seen  on comparison exam measuring 7 mm that time Upper Abdomen: Limited view of the liver, kidneys, pancreas are unremarkable. Normal adrenal glands. Musculoskeletal: No aggressive osseous lesion. IMPRESSION: 1. Waxing waning bilateral pulmonary nodules suggest chronic pulmonary infection/atypical infection. 2. Branching nodular pattern within the medial upper lobes also consistent with atypical pulmonary infection. 3. Dense valvular calcifications with valvular prosthetics. Electronically Signed   By: Suzy Bouchard [name]   On: 05/13/2022 15:59    Recent Labs: Lab Results  Component Value Date   WBC 4.4 03/21/2022   HGB 12.5 03/21/2022   PLT 88.0 Repeated and verified X2. (L) 03/21/2022   NA 140 01/03/2022   K 4.2 01/03/2022   CL 100 01/03/2022   CO2 33 (H) 01/03/2022   GLUCOSE 94 01/03/2022   BUN 22 01/03/2022   CREATININE 0.96 01/03/2022   BILITOT 0.6 03/21/2022   ALKPHOS 123 (H) 03/21/2022   AST 25 03/21/2022   ALT 49 (H) 03/21/2022   PROT 6.0 03/21/2022   ALBUMIN 3.5 03/21/2022   CALCIUM 9.4 01/03/2022   GFRAA 82 05/31/2019   QFTBGOLDPLUS NEGATIVE 02/14/2022    Speciality Comments: No specialty comments available.  Procedures:  No procedures performed Allergies: Codeine, Sulfa antibiotics, and Sulfonamide derivatives   Assessment / Plan:     Visit Diagnoses: Positive ANA (antinuclear antibody) - Plan: RNP Antibody, Anti-Smith antibody, Sjogrens syndrome-A extractable nuclear antibody, C3 and C4, CK, Anti-scleroderma antibody, Sjogrens syndrome-B extractable nuclear antibody  Positive ANA at a high titer and also positive rheumatoid factor serology but no specific clinical findings for systemic inflammatory disease activity at this time.  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  We will check more specific antibody tests including RNP, Smith, SSA, SSB, SCL 70, and serum  complements.  Also checking CK level.  Pulmonary nodules  Looks like no indication or plan for biopsy so it may be difficult to entirely exclude inflammatory process but lower pretest suspicion and will follow-up with lab results.  DISH (diffuse idiopathic skeletal hyperostosis)  Reviewed DISH pathophysiology and prognosis.  Her exaggerated thoracic  kyphosis does not appear to be causing restrictive pulmonary defect.  Back pain does limit her mobility. This definitely looks degenerative and not related to inflammatory spondyloarthritis.  Orders: Orders Placed This Encounter  Procedures   RNP Antibody   Anti-Smith antibody   Sjogrens syndrome-A extractable nuclear antibody   C3 and C4   CK   Anti-scleroderma antibody   Sjogrens syndrome-B extractable nuclear antibody   No orders of the defined types were placed in this encounter.    Follow-Up Instructions: No follow-ups on file.   Collier Salina, MD  Note - This record has been created using [employer].  Chart creation errors have been sought, but may not always  have been located. Such creation errors do not reflect on  the standard of medical care.

## 2022-05-31 LAB — ANTI-SCLERODERMA ANTIBODY: Scleroderma (Scl-70) (ENA) Antibody, IgG: <1 AI

## 2022-05-31 LAB — CK: Total CK: 45 U/L (ref 29–143)

## 2022-05-31 LAB — RNP ANTIBODY: Ribonucleic Protein(ENA) Antibody, IgG: <1 AI

## 2022-05-31 LAB — SJOGRENS SYNDROME-A EXTRACTABLE NUCLEAR ANTIBODY: SSA (Ro) (ENA) Antibody, IgG: <1 AI

## 2022-05-31 LAB — C3 AND C4
C3 Complement: 152 mg/dL (ref 83–193)
C4 Complement: 35 mg/dL (ref 15–57)

## 2022-05-31 LAB — ANTI-SMITH ANTIBODY: ENA SM Ab Ser-aCnc: <1 AI

## 2022-05-31 LAB — SJOGRENS SYNDROME-B EXTRACTABLE NUCLEAR ANTIBODY: SSB (La) (ENA) Antibody, IgG: <1 AI

## 2022-06-02 NOTE — Progress Notes (Signed)
Lab results we checked in clinic are all negative. I do not see evidence that she has an ongoing systemic inflammatory condition related to her abnormal test results. She will need to follow up with Dr. [name] for managing her lung issues.

## 2022-06-17 ENCOUNTER — Encounter: Payer: Self-pay | Admitting: Family Medicine

## 2022-06-17 MED ORDER — TRAMADOL HCL 50 MG PO TABS: 50.0000 mg | ORAL_TABLET | Freq: Two times a day (BID) | ORAL | 0 refills | Status: DC | PRN

## 2022-06-17 NOTE — Telephone Encounter (Signed)
Name of Medication: Tramadol Name of Pharmacy: [facility] or Written Date and Quantity: 05/24/22, #40 Last Office Visit and Type: 01/03/22, f/u Next Office Visit and Type: 06/24/22, CPE Last Controlled Substance Agreement Date: none  Last UDS: none

## 2022-06-17 NOTE — Telephone Encounter (Signed)
ERx 

## 2022-06-22 ENCOUNTER — Other Ambulatory Visit (INDEPENDENT_AMBULATORY_CARE_PROVIDER_SITE_OTHER): Payer: Medicare Other

## 2022-06-22 ENCOUNTER — Other Ambulatory Visit: Payer: Self-pay | Admitting: Family Medicine

## 2022-06-22 DIAGNOSIS — E559 Vitamin D deficiency, unspecified: Secondary | ICD-10-CM

## 2022-06-22 DIAGNOSIS — N182 Chronic kidney disease, stage 2 (mild): Secondary | ICD-10-CM

## 2022-06-22 DIAGNOSIS — E538 Deficiency of other specified B group vitamins: Secondary | ICD-10-CM

## 2022-06-22 DIAGNOSIS — E785 Hyperlipidemia, unspecified: Secondary | ICD-10-CM | POA: Diagnosis not present

## 2022-06-22 DIAGNOSIS — D696 Thrombocytopenia, unspecified: Secondary | ICD-10-CM

## 2022-06-22 LAB — CBC WITH DIFFERENTIAL/PLATELET
Basophils Absolute: 0 10*3/uL (ref 0.0–0.1)
Basophils Relative: 0.5 % (ref 0.0–3.0)
Eosinophils Absolute: 0.1 10*3/uL (ref 0.0–0.7)
Eosinophils Relative: 2.1 % (ref 0.0–5.0)
HCT: 42.6 % (ref 36.0–46.0)
Hemoglobin: 14 g/dL (ref 12.0–15.0)
Lymphocytes Relative: 9.7 % — ABNORMAL LOW (ref 12.0–46.0)
Lymphs Abs: 0.7 10*3/uL (ref 0.7–4.0)
MCHC: 32.8 g/dL (ref 30.0–36.0)
MCV: 101.4 fl — ABNORMAL HIGH (ref 78.0–100.0)
Monocytes Absolute: 0.7 10*3/uL (ref 0.1–1.0)
Monocytes Relative: 10 % (ref 3.0–12.0)
Neutro Abs: 5.3 10*3/uL (ref 1.4–7.7)
Neutrophils Relative %: 77.7 % — ABNORMAL HIGH (ref 43.0–77.0)
Platelets: 96 10*3/uL — ABNORMAL LOW (ref 150.0–400.0)
RBC: 4.2 Mil/uL (ref 3.87–5.11)
RDW: 14.5 % (ref 11.5–15.5)
WBC: 6.8 10*3/uL (ref 4.0–10.5)

## 2022-06-22 LAB — LIPID PANEL
Cholesterol: 154 mg/dL (ref 0–200)
HDL: 61.7 mg/dL (ref >39.00)
LDL Cholesterol: 75 mg/dL (ref 0–99)
NonHDL: 92.37
Total CHOL/HDL Ratio: 2
Triglycerides: 88 mg/dL (ref 0.0–149.0)
VLDL: 17.6 mg/dL (ref 0.0–40.0)

## 2022-06-22 LAB — COMPREHENSIVE METABOLIC PANEL
ALT: 15 U/L (ref 0–35)
AST: 22 U/L (ref 0–37)
Albumin: 3.7 g/dL (ref 3.5–5.2)
Alkaline Phosphatase: 85 U/L (ref 39–117)
BUN: 17 mg/dL (ref 6–23)
CO2: 31 mEq/L (ref 19–32)
Calcium: 9.6 mg/dL (ref 8.4–10.5)
Chloride: 102 mEq/L (ref 96–112)
Creatinine, Ser: 0.94 mg/dL (ref 0.40–1.20)
GFR: 58.75 mL/min — ABNORMAL LOW (ref >60.00)
Glucose, Bld: 84 mg/dL (ref 70–99)
Potassium: 3.7 mEq/L (ref 3.5–5.1)
Sodium: 143 mEq/L (ref 135–145)
Total Bilirubin: 0.6 mg/dL (ref 0.2–1.2)
Total Protein: 7 g/dL (ref 6.0–8.3)

## 2022-06-22 LAB — VITAMIN B12: Vitamin B-12: 535 pg/mL (ref 211–911)

## 2022-06-22 LAB — MICROALBUMIN / CREATININE URINE RATIO
Creatinine,U: 145.8 mg/dL
Microalb Creat Ratio: 28.3 mg/g (ref 0.0–30.0)
Microalb, Ur: 41.3 mg/dL — ABNORMAL HIGH (ref 0.0–1.9)

## 2022-06-22 LAB — VITAMIN D 25 HYDROXY (VIT D DEFICIENCY, FRACTURES): VITD: 63.83 ng/mL (ref 30.00–100.00)

## 2022-06-23 ENCOUNTER — Ambulatory Visit (INDEPENDENT_AMBULATORY_CARE_PROVIDER_SITE_OTHER): Payer: Medicare Other

## 2022-06-23 VITALS — Ht 64.0 in | Wt 152.0 lb

## 2022-06-23 DIAGNOSIS — Z0000 Encounter for general adult medical examination without abnormal findings: Secondary | ICD-10-CM | POA: Diagnosis not present

## 2022-06-23 NOTE — Patient Instructions (Signed)
[name] , Thank you for taking time to come for your Medicare Wellness Visit. I appreciate your ongoing commitment to your health goals. Please review the following plan we discussed and let me know if I can assist you in the future.   Screening recommendations/referrals: Colonoscopy: 05/24/21 Mammogram: 04/11/22 Bone Density: 06/12/18 Recommended yearly ophthalmology/optometry visit for glaucoma screening and checkup Recommended yearly dental visit for hygiene and checkup  Vaccinations: Influenza vaccine: 07/27/21 Pneumococcal vaccine: 06/02/14 Tdap vaccine: 10/11/05, due if have injury Shingles vaccine: Zostavax 09/16/14    Shingrix 06/25/18, 08/28/18   Covid-19:10/30/19, 11/20/19, 08/24/20, 02/26/21, 07/02/21  Advanced directives: yes  Conditions/risks identified: no  Next appointment: Follow up in one year for your annual wellness visit 06/26/23 @ 1:45 pm by phone   Preventive Care 65 Years and Older, Female Preventive care refers to lifestyle choices and visits with your health care provider that can promote health and wellness. What does preventive care include? A yearly physical exam. This is also called an annual well check. Dental exams once or twice a year. Routine eye exams. Ask your health care provider how often you should have your eyes checked. Personal lifestyle choices, including: Daily care of your teeth and gums. Regular physical activity. Eating a healthy diet. Avoiding tobacco and drug use. Limiting alcohol use. Practicing safe sex. Taking low-dose aspirin every day. Taking vitamin and mineral supplements as recommended by your health care provider. What happens during an annual well check? The services and screenings done by your health care provider during your annual well check will depend on your age, overall health, lifestyle risk factors, and family history of disease. Counseling  Your health care provider may ask you questions about your: Alcohol  use. Tobacco use. Drug use. Emotional well-being. Home and relationship well-being. Sexual activity. Eating habits. History of falls. Memory and ability to understand (cognition). Work and work Statistician. Reproductive health. Screening  You may have the following tests or measurements: Height, weight, and BMI. Blood pressure. Lipid and cholesterol levels. These may be checked every 5 years, or more frequently if you are over 61 years old. Skin check. Lung cancer screening. You may have this screening every year starting at age 41 if you have a 30-pack-year history of smoking and currently smoke or have quit within the past 15 years. Fecal occult blood test (FOBT) of the stool. You may have this test every year starting at age 21. Flexible sigmoidoscopy or colonoscopy. You may have a sigmoidoscopy every 5 years or a colonoscopy every 10 years starting at age 60. Hepatitis C blood test. Hepatitis B blood test. Sexually transmitted disease (STD) testing. Diabetes screening. This is done by checking your blood sugar (glucose) after you have not eaten for a while (fasting). You may have this done every 1-3 years. Bone density scan. This is done to screen for osteoporosis. You may have this done starting at age 51. Mammogram. This may be done every 1-2 years. Talk to your health care provider about how often you should have regular mammograms. Talk with your health care provider about your test results, treatment options, and if necessary, the need for more tests. Vaccines  Your health care provider may recommend certain vaccines, such as: Influenza vaccine. This is recommended every year. Tetanus, diphtheria, and acellular pertussis (Tdap, Td) vaccine. You may need a Td booster every 10 years. Zoster vaccine. You may need this after age 36. Pneumococcal 13-valent conjugate (PCV13) vaccine. One dose is recommended after age 31. Pneumococcal polysaccharide (PPSV23)  vaccine. One dose is  recommended after age 41. Talk to your health care provider about which screenings and vaccines you need and how often you need them. This information is not intended to replace advice given to you by your health care provider. Make sure you discuss any questions you have with your health care provider. Document Released: 10/23/2015 Document Revised: 06/15/2016 Document Reviewed: 07/28/2015 Elsevier Interactive Patient Education  2017 [employer] Prevention in the Home Falls can cause injuries. They can happen to people of all ages. There are many things you can do to make your home safe and to help prevent falls. What can I do on the outside of my home? Regularly fix the edges of walkways and driveways and fix any cracks. Remove anything that might make you trip as you walk through a door, such as a raised step or threshold. Trim any bushes or trees on the path to your home. Use bright outdoor lighting. Clear any walking paths of anything that might make someone trip, such as rocks or tools. Regularly check to see if handrails are loose or broken. Make sure that both sides of any steps have handrails. Any raised decks and porches should have guardrails on the edges. Have any leaves, snow, or ice cleared regularly. Use sand or salt on walking paths during winter. Clean up any spills in your garage right away. This includes oil or grease spills. What can I do in the bathroom? Use night lights. Install grab bars by the toilet and in the tub and shower. Do not use towel bars as grab bars. Use non-skid mats or decals in the tub or shower. If you need to sit down in the shower, use a plastic, non-slip stool. Keep the floor dry. Clean up any water that spills on the floor as soon as it happens. Remove soap buildup in the tub or shower regularly. Attach bath mats securely with double-sided non-slip rug tape. Do not have throw rugs and other things on the floor that can make you  trip. What can I do in the bedroom? Use night lights. Make sure that you have a light by your bed that is easy to reach. Do not use any sheets or blankets that are too big for your bed. They should not hang down onto the floor. Have a firm chair that has side arms. You can use this for support while you get dressed. Do not have throw rugs and other things on the floor that can make you trip. What can I do in the kitchen? Clean up any spills right away. Avoid walking on wet floors. Keep items that you use a lot in easy-to-reach places. If you need to reach something above you, use a strong step stool that has a grab bar. Keep electrical cords out of the way. Do not use floor polish or wax that makes floors slippery. If you must use wax, use non-skid floor wax. Do not have throw rugs and other things on the floor that can make you trip. What can I do with my stairs? Do not leave any items on the stairs. Make sure that there are handrails on both sides of the stairs and use them. Fix handrails that are broken or loose. Make sure that handrails are as long as the stairways. Check any carpeting to make sure that it is firmly attached to the stairs. Fix any carpet that is loose or worn. Avoid having throw rugs at the top or bottom  of the stairs. If you do have throw rugs, attach them to the floor with carpet tape. Make sure that you have a light switch at the top of the stairs and the bottom of the stairs. If you do not have them, ask someone to add them for you. What else can I do to help prevent falls? Wear shoes that: Do not have high heels. Have rubber bottoms. Are comfortable and fit you well. Are closed at the toe. Do not wear sandals. If you use a stepladder: Make sure that it is fully opened. Do not climb a closed stepladder. Make sure that both sides of the stepladder are locked into place. Ask someone to hold it for you, if possible. Clearly mark and make sure that you can  see: Any grab bars or handrails. First and last steps. Where the edge of each step is. Use tools that help you move around (mobility aids) if they are needed. These include: Canes. Walkers. Scooters. Crutches. Turn on the lights when you go into a dark area. Replace any light bulbs as soon as they burn out. Set up your furniture so you have a clear path. Avoid moving your furniture around. If any of your floors are uneven, fix them. If there are any pets around you, be aware of where they are. Review your medicines with your doctor. Some medicines can make you feel dizzy. This can increase your chance of falling. Ask your doctor what other things that you can do to help prevent falls. This information is not intended to replace advice given to you by your health care provider. Make sure you discuss any questions you have with your health care provider. Document Released: 07/23/2009 Document Revised: 03/03/2016 Document Reviewed: 10/31/2014 Elsevier Interactive Patient Education  2017 [employer].

## 2022-06-23 NOTE — Progress Notes (Signed)
Virtual Visit via Telephone Note  I connected with  [name] on 06/23/22 at  2:15 PM EDT by telephone and verified that I am speaking with the correct person using two identifiers.  Location: Patient: home Provider: [place] Persons participating in the virtual visit: [place]   I discussed the limitations, risks, security and privacy concerns of performing an evaluation and management service by telephone and the availability of in person appointments. The patient expressed understanding and agreed to proceed.  Interactive audio and video telecommunications were attempted between this nurse and patient, however failed, due to patient having technical difficulties OR patient did not have access to video capability.  We continued and completed visit with audio only.  Some vital signs may be absent or patient reported.   Dionisio David, LPN  Subjective:   [name] is a 76 y.o. female who presents for Medicare Annual (Subsequent) preventive examination.  Review of Systems     Cardiac Risk Factors include: advanced age (>58mn, >>78women);sedentary lifestyle     Objective:    Today's Vitals   06/23/22 1455  Weight: 152 lb (68.9 kg)  Height: _0  (1.626 m)   Body mass index is 26.09 kg/m.     06/23/2022    2:26 PM 11/19/2021    9:02 AM 03/24/2021    1:16 PM 06/04/2019    2:17 PM 05/08/2019    6:31 AM 12/10/2018    9:35 AM 07/11/2018    8:54 AM  Advanced Directives  Does Patient Have a Medical Advance Directive? _1  No Yes  Type of AParamedicof ADrowning CreekLiving will HPinebluffLiving will Healthcare Power of ASans SouciLiving will HEskoLiving will  [place] Does patient want to make changes to medical advance directive? No - Patient declined    No - Patient declined  No - Patient declined  Copy of  HTamiamiin Chart? Yes - validated most recent copy scanned in chart (See row information)  No - copy requested No - copy requested No - copy requested  No - copy requested  Would patient like information on creating a medical advance directive?      No - Patient declined     Current Medications (verified) Outpatient Encounter Medications as of 06/23/2022  Medication Sig   acetaminophen (TYLENOL) 500 MG tablet Take 2 tablets (1,000 mg total) by mouth daily.   apixaban (ELIQUIS) 5 MG TABS tablet Take 1 tablet (5 mg total) by mouth 2 (two) times daily.   Biotin 1000 MCG tablet Take 1,000 mcg by mouth daily.   calcium carbonate (OSCAL) 1500 (600 Ca) MG TABS tablet Take 600 mg of elemental calcium by mouth daily.   carboxymethylcellulose (REFRESH PLUS) 0.5 % SOLN Place 1 drop into both eyes 3 (three) times daily as needed (dry eyes).   cephALEXin (KEFLEX) 250 MG capsule Take 250 mg by mouth daily.   Cholecalciferol (VITAMIN D) 50 MCG (2000 UT) tablet Take 2,000 Units by mouth daily.   docusate sodium (COLACE) 100 MG capsule Take 200 mg by mouth every evening.   Fluticasone-Umeclidin-Vilant (TRELEGY ELLIPTA) 200-62.5-25 MCG/ACT AEPB INHALE 1 PUFF INTO THE LUNGS DAILY   furosemide (LASIX) 40 MG tablet Take 1 tablet (40 mg total) by mouth daily. May take an extra tablet daily as needed for swelling.   gabapentin (NEURONTIN) 300 MG capsule Take 300 mg  by mouth at bedtime.   lovastatin (MEVACOR) 40 MG tablet TAKE 1 TABLET(40 MG) BY MOUTH AT BEDTIME   montelukast (SINGULAIR) 10 MG tablet TAKE 1 TABLET(10 MG) BY MOUTH AT BEDTIME   Multiple Vitamins-Minerals (PRESERVISION AREDS 2 PO) Take 2 capsules by mouth every evening.    potassium chloride (KLOR-CON) 10 MEQ tablet TAKE 2 TABLETS(20 MEQ) BY MOUTH DAILY   traMADol (ULTRAM) 50 MG tablet Take 1 tablet (50 mg total) by mouth 2 (two) times daily as needed.   traZODone (DESYREL) 50 MG tablet TAKE 1/2 TO 1 TABLET(25 TO 50 MG) BY MOUTH AT  BEDTIME   Vibegron (GEMTESA) 75 MG TABS Take 75 mg by mouth daily.   vitamin B-12 (CYANOCOBALAMIN) 500 MCG tablet Take 1 tablet (500 mcg total) by mouth 2 (two) times a week.   Zinc 50 MG CAPS Take 1 capsule (50 mg total) by mouth daily.   albuterol (VENTOLIN HFA) 108 (90 Base) MCG/ACT inhaler Inhale 2 puffs into the lungs every 6 (six) hours as needed for wheezing. (Patient not taking: Reported on 06/23/2022)   ipratropium-albuterol (DUONEB) 0.5-2.5 (3) MG/3ML SOLN Take 3 mLs by nebulization every 6 (six) hours as needed. (Patient not taking: Reported on 06/23/2022)   nitrofurantoin, macrocrystal-monohydrate, (MACROBID) 100 MG capsule Take 100 mg by mouth 2 (two) times daily. (Patient not taking: Reported on 06/23/2022)   polyethylene glycol (MIRALAX / GLYCOLAX) packet Take 17 g by mouth daily. (Patient not taking: Reported on 06/23/2022)   silver sulfADIAZINE (SILVADENE) 1 % cream Apply 1 application topically daily. 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 Known DISH with thoracic spine fusion and moderate kyphosis, h/o L spine fusion L2-5   Adjustment disorder with depressed mood 2014-11-01   78 yo son died MI 10-06-14 Husband died car accident 04-07-2015   Anemia    Anticoagulant long-term use 06/14/2019   She takes warfarin for afib/flutter with CHADS2VASc score of 4 (age, sex, CHF, HTN), s/p 2 bioprosthetic valve replacements Coumadin changed to eliquis 2022 per cardiology   Aortic atherosclerosis  03/03/2022   Noted on 01/2022 chest CT   Arthritis    Arthropathy of spinal facet joint 03/08/2018   Ascending aortic aneurysm 03/02/2021   4.1cm on CT 01/2021 rec yearly monitoring   Bilateral hip pain 09/08/2016   CAP (community acquired pneumonia) 02/08/2016   Carotid stenosis 12/08/2020   Carotid US - 40-59% BICA stenosis (2015) Carotid US 12/2020 - 1-39% BICA stenosis, incidental 3.4cm structure behind L common carotid artery rec further imaging    Cataracts, bilateral    immature   Chest pain 08/25/2014   myoview low risk 02/2018 ([place])   Chronic cholecystitis with calculus 01/21/2015   S/p cholecystectomy    Chronic diastolic heart failure 50/56/9794   Chronic insomnia    Chronic lower back pain 03/05/2008   Returned to Dr Ellene Route - translaminar ESI at L3/4. Myelogram showed significant left sided foraminal stenosis at L2/3 and L3/4 in setting of solid arthrodesis, planned DEXA and if stable osteopenia, considering anterolateral decompression with spacer.  CKD (chronic kidney disease) stage 2, GFR 60-89 ml/min 07/03/2015   Constipation    takes Miralax daily as needed   COPD (chronic obstructive pulmonary disease)    Albuterol inhaler daily as needed;Duoneb daily as needed;Spiriva daily   DDD (degenerative disc disease)    cervical - kyphosis with mod DD changes C5/6 and C6/7; lumbar - early DD at L2/3 (Elsner)   DISH (diffuse idiopathic skeletal hyperostosis) 05/14/2019   By MRI noted fusion of lower thoracic vertebrae - likely contributing to unsteadiness (Elsner)   Dyspnea on exertion 06/05/2015   myoview low risk 02/2018   Essential hypertension, benign 03/24/2009   GERD (gastroesophageal reflux disease)    takes Protonix daily   Hematuria 06/17/2021   History of radiation therapy 05/30/11 to 07/07/11   rectum   History of rectal cancer 05/26/2011   Rectal cancer 2012, midrectum ypT3ypN0, s/p neoadj chemo&XRT, lap LAR with diverting loop ileostomy 08/2011, ileostomy  takedown 03/2012 Discharged from oncology clinic 2017 Benay Spice)   History of shingles    History of vertebral fracture 11/26/2019   Old L1 vertebral compression fracture incidentally noted on imaging 2020    HLD (hyperlipidemia) 05/03/2007   Hyperlipemia    takes Lovastatin daily   Lacunar infarction    Left caudate   Left hip pain 07/29/2020   Legally blind in left eye    Lung nodule    RLL nodule-33m stable 2006, April 2009, and June 2009   Lymphangioma 01/07/2021   Noted incidentally on carotid [place]/2022 CT neck - Likely benign lymphangioma 01/2021   Macrocytosis 03/27/2022   Folate and b12 checked 2023   Malaise and fatigue 01/22/2018   Meralgia paresthetica 12/13/2007   Mixed stress and urge urinary incontinence 03/16/2015   Failed oxybutynin IR/ER. Vesicare initially helped but then no longer helpful.  myrbetriq was not effective.  Saw urology (MacDiarmid) - UDS largely overactive bladder with mild-mod stress incontinence. 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   Pulmonary infiltrates 04/23/2019   06/13/2018-CT super D chest without contrast- generally stable chronic lung disease with pleural parenchymal scarring and scattered nodularity most of these nodules have been tree-in-bud in appearance likely postinfectious or inflammatory the dominant right lower lobe nodule seen on most recent study has resolved no new or enlarging nodules  04/22/2019-CT chest without contrast- waxing and waning per   Pulmonary nodule 02/23/2012   RLL nodule-642mstable 2006, April 2009, and June 2009 Small pulm nodules Jan 2012 - > Oct 2012 without change   Rheumatic heart disease mitral stenosis    mod MS by echo 02/2014  Right leg swelling 06/11/2020   R venous US 06/2020 WNL: - No evidence of deep vein thrombosis in the lower extremity. No indirect evidence of obstruction proximal to the inguinal ligament. - No cystic structure found in the popliteal fossa.   S/P aortic and mitral valve bioprostheses 12/08/2013   AVR 21 mm, MVR 25 mm-MagnaEase pericardia   S/P AVR (aortic valve replacement) 2015   bioprosthetic (Bartle)   S/P MVR (mitral valve replacement) 2015   bioprosthetic (Bartle)   Sepsis secondary to UTI ([place]) 08/22/2017   Severe aortic valve stenosis 10/01/2013   mild-mod by echo 02/2014   Skin lesion 01/03/2022   Small bowel obstruction, partial 08/2013   reolved without NGT placement.    Syncope 05/28/2018   Thrombocytopenia 11/04/2019   Typical atrial flutter 05/15/2019   Vitamin B12 deficiency 02/28/2015   Start B12 shots 02/2015    Vitamin D deficiency 10/23/2016   Past Surgical History:  Procedure Laterality Date   ANTERIOR LAT LUMBAR FUSION Left 07/17/2018   Procedure: Lumbar Two-Three Lumbar Three-Four Anterolateral decompression/interbody fusion with lateral plate fixation/Infuse;  Surgeon: Kristeen Miss, MD;  Location: [facility];  Service: Neurosurgery;  Laterality: Left;  Lumbar Two-Three Lumbar Three-Four Anterolateral  decompression/interbody fusion with lateral plate fixation/Infuse   AORTIC VALVE REPLACEMENT N/A 12/12/2013   Procedure: AORTIC VALVE REPLACEMENT (AVR);  Surgeon: Gaye Pollack, MD; Service: Open Heart Surgery   BACK SURGERY  2006, 2007   2006 SPACER, 2007 decompression and fusion L4/5   BOWEL RESECTION  04/02/2012   Procedure: SMALL BOWEL RESECTION;  Surgeon: Stark Klein, MD;  Location: [facility];  Service: General;  Laterality: N/A;   BRONCHIAL WASHINGS  03/24/2021   Procedure: BRONCHIAL WASHINGS;  Surgeon: Brand Males, MD;  Location: [facility];  Service: Endoscopy;;   CARPAL TUNNEL RELEASE Bilateral    CATARACT EXTRACTION W/ INTRAOCULAR LENS IMPLANT Left 10/18/2016   CATARACT EXTRACTION W/ INTRAOCULAR LENS IMPLANT Right 12/13/2016   Dr. Kathrin Penner   CHOLECYSTECTOMY N/A 01/21/2015   chronic cholecystitis, Stark Klein, MD   COLON RESECTION  08/25/2011   Procedure: COLON RESECTION LAPAROSCOPIC;  Surgeon: Stark Klein, MD;  Location: [facility];  Service: General;  Laterality: N/A;  Laparoscopic Assisted Low Anterior Resection Diverting Ostomy and onQ pain pump   COLONOSCOPY  03/2013   1 polyp, rpt 3 yrs Ardis Hughs)   COLONOSCOPY  05/2021   diverticulosis and patent colon anastomosis with some edema/narrowing, rpt 5 yrs Ardis Hughs)   COLONOSCOPY WITH PROPOFOL N/A 06/09/2016   patent colo-colonic anastomosis, rpt 5 yrs Ardis Hughs)   EP IMPLANTABLE DEVICE N/A 06/16/2016   Procedure: Pacemaker Implant;  Surgeon: Will Meredith Leeds, MD;  Location: [facility];  Service: Cardiovascular;  Laterality: N/A;   FOOT SURGERY  left foot   hammer toe   ILEOSTOMY  08/25/2011   ILEOSTOMY CLOSURE  04/02/2012   Procedure: ILEOSTOMY TAKEDOWN;  Surgeon: Stark Klein, MD;  [facility];  Service: General;  Laterality: N/A;   INTRAOPERATIVE TRANSESOPHAGEAL ECHOCARDIOGRAM N/A 12/12/2013   Procedure: INTRAOPERATIVE TRANSESOPHAGEAL ECHOCARDIOGRAM;  Surgeon: Gaye Pollack, MD;  Location: [facility];   Service: Open Heart Surgery;  Laterality: N/A;   LEFT AND RIGHT HEART CATHETERIZATION WITH CORONARY ANGIOGRAM N/A 11/27/2013   Procedure: LEFT AND RIGHT HEART CATHETERIZATION WITH CORONARY ANGIOGRAM;  Surgeon: Sanda Klein, MD;  Location: [facility];  Service: Cardiovascular;  Laterality: N/A;   MITRAL VALVE REPLACEMENT N/A 12/12/2013   Procedure: MITRAL VALVE (MV) REPLACEMENT OR REPAIR;  Surgeon: Gaye Pollack, MD;  Service: Open Heart Surgery   TEE WITHOUT CARDIOVERSION N/A 11/29/2013   Procedure: TRANSESOPHAGEAL ECHOCARDIOGRAM (TEE);  Surgeon: Sueanne Margarita, MD;  Location: [facility];  Service: Cardiovascular;  Laterality: N/A;   TEE WITHOUT CARDIOVERSION N/A 06/04/2019   Procedure: TRANSESOPHAGEAL ECHOCARDIOGRAM (TEE);  Surgeon: Sanda Klein, MD;  Location: [facility];  Service: Cardiovascular;  Laterality: N/A;   TONSILLECTOMY     VIDEO BRONCHOSCOPY N/A 03/24/2021   Procedure: VIDEO BRONCHOSCOPY WITHOUT FLUORO;  Surgeon: Brand Males, MD;  [facility];  Service: Endoscopy;  Laterality: N/A;  SMALL SCOPE,PREMEDICATE WITH NEBULIZED LIDOCAINE   Family History  Problem Relation Age of Onset   Heart disease Mother    Diabetes Mother    Hypertension Mother    Stroke Mother    Lung cancer Father    Breast cancer Maternal Aunt 83   Heart attack Maternal Grandfather    CAD Son 57   Heart attack Son 55   Rectal cancer Neg Hx    Stomach cancer Neg Hx    Esophageal cancer Neg Hx    Colon cancer Neg Hx    Social History   Socioeconomic History   Marital status: Widowed    Spouse name: Not on file   Number of children: 2   Years of education: 12   Highest education level: High school graduate  Occupational History   Occupation: Retired  Tobacco Use   Smoking status: Former    Packs/day: 1.50    Years: 10.00    Total pack years: 15.00    Types: Cigarettes    Quit date: 12/28/1974    Years since quitting: 47.5   Smokeless tobacco: Never   Tobacco  comments:    quit smoking in 1976  Vaping Use   Vaping Use: Never used  Substance and Sexual Activity   Alcohol use: Not Currently    Alcohol/week: 2.0 standard drinks of alcohol    Types: 2 Glasses of wine per week    Comment: very rare   Drug use: No   Sexual activity: Not Currently  Other Topics Concern   Not on file  Social History Narrative   Widow. Husband died March 21, 2015 MVA. 1 cat.    2 sons; 1 grandchild. One son died of MI 42   Edu: Completed HS, some college   Married 1966   Retired-[employer], volunteers at [employer]   Activity: pulm rehab   Diet: some water, fruits/vegetables daily      Right handed   Caffeine: 4 glasses of tea and 1 diet coke a day   Social Determinants of Health   Financial Resource Strain: Not on file  Food Insecurity: Not on file  Transportation Needs: Not on file  Physical Activity: Inactive (06/23/2022)   Exercise Vital Sign    Days of Exercise per Week: 0 days    Minutes of Exercise per Session: 0 min  Stress: No Stress Concern Present (06/23/2022)   [place] of Stress : Not at all  Social Connections: Not on file    Tobacco Counseling Counseling given: Not Answered Tobacco comments: quit smoking in 1976   Clinical Intake:  Pre-visit preparation completed: Yes  Pain : No/denies pain     Nutritional Risks: None Diabetes: No  How often do you need to have someone help you when you read instructions, pamphlets, or other written materials from your doctor or pharmacy?: 1 - Never  Diabetic?no  Interpreter Needed?: No  Information entered by :: Kirke Shaggy, LPN   Activities of Daily Living    06/23/2022    2:30 PM 06/21/2022   12:26 PM  In your present state of health, do you have any difficulty performing the following activities:  Hearing? 1 0  Vision? 0 0  Difficulty concentrating or making decisions? 0 0  Walking or climbing stairs? 0 0   Dressing or bathing? 0 0  Doing errands, shopping? 0 0  Preparing Food and eating ? N N  Using the Toilet? N N  In the past six months, have you accidently leaked urine? Y Y  Do you have problems with loss of bowel control? N N  Managing your Medications? N N  Managing your Finances? N N  Housekeeping or managing your Housekeeping? N     Patient Care Team: Ria Bush, MD as PCP - General (Family Medicine) Sanda Klein, MD as PCP - Cardiology (Cardiology) [name], MD as Consulting Physician (Gastroenterology) Kyung Rudd, MD as Consulting Physician (Radiation Oncology) Nira Retort, MD as Consulting Physician (Hematology and Oncology) Terance Ice, MD (Inactive) as Consulting Physician (Cardiology) Brand Males, MD as Consulting Physician (Pulmonary Disease) Stark Klein, MD (General Surgery) Sanda Klein, MD as Consulting Physician (Cardiology) Shon Hough, MD as Consulting Physician (Ophthalmology) Martinique, Amy, MD as Consulting Physician (Dermatology) Pieter Partridge, DO as Consulting Physician (Neurology)  Indicate any recent Medical Services you may have received from other than [facility] providers in the past year (date may be approximate).     Assessment:   This is a routine wellness examination for [name].  Hearing/Vision screen Hearing Screening - Comments:: Wears aids Vision Screening - Comments:: Wears glasses- Dr.Tanner is [place]  Dietary issues and exercise activities discussed: Current Exercise Habits: The patient does not participate in regular exercise at present   Goals Addressed             This Visit's Progress    DIET - EAT MORE FRUITS AND VEGETABLES        Depression Screen    06/23/2022    2:24 PM 06/16/2021    8:49 AM 06/10/2020    9:26 AM 05/02/2019    8:33 AM 11/15/2017   10:18 AM 11/07/2016   10:34 AM 10/28/2015    8:32 AM  PHQ 2/9 Scores  PHQ - 2 Score 1 0 0 0 2 0 0  PHQ- 9 Score 6    10      Fall  Risk    06/23/2022    2:28 PM 06/21/2022   12:26 PM 11/19/2021    9:02 AM 06/16/2021    8:49 AM 06/10/2020    9:26 AM  Fall Risk   Falls in the past year? 0 0 0 1 0  Number falls in past yr: 0 0 0 1   Injury with Fall? 0 0 0 1   Comment    Abrasions   Risk for fall due to : No Fall Risks;History of fall(s)      Follow up Falls prevention discussed;Falls evaluation completed        FALL RISK PREVENTION PERTAINING TO THE HOME:  Any stairs in or around the home? Yes  If so, are there any without handrails? No  Home free of loose throw rugs in walkways, pet beds, electrical cords, etc? Yes  Adequate lighting in your home to reduce risk of falls? No   ASSISTIVE DEVICES UTILIZED TO PREVENT FALLS:  Life alert? No  Use of a  cane, walker or w/c? Yes  Grab bars in the bathroom? Yes  Shower chair or bench in shower? Yes  Elevated toilet seat or a handicapped toilet? Yes   Cognitive Function:      05/28/2021    8:44 AM 11/15/2017   10:19 AM 11/07/2016   10:34 AM  MMSE - Mini Mental State Exam  Orientation to time _0 Orientation to Place _1 Registration _2 Attention/ Calculation 5 0 0  Recall _3 Language- name 2 objects 2 0 0  Language- repeat _4 Language- follow 3 step command _5 Language- read & follow direction 1 0 0  Write a sentence 1 0 0  Copy design 1 0 0  Total score _6 06/23/2022    2:34 PM  6CIT Screen  What Year? 0 points  What month? 0 points  What time? 0 points  Count back from 20 0 points  Months in reverse 0 points  Repeat phrase 0 points  Total Score 0 points    Immunizations Immunization History  Administered Date(s) Administered   Fluad Quad(high Dose 65+) 07/11/2019, 07/14/2020   H1N1 09/17/2008   Influenza Split 07/18/2012   Influenza Whole 08/10/2005, 06/24/2009, 07/11/2011   Influenza, High Dose Seasonal PF 07/04/2016, 07/04/2017, 07/27/2021   Influenza,inj,Quad PF,6+ Mos 07/09/2018   Influenza-Unspecified  07/24/2013, 06/30/2014, 06/30/2015   Moderna SARS-COV2 Booster Vaccination 08/24/2020   Moderna Sars-Covid-2 Vaccination 02/26/2021   PFIZER(Purple Top)SARS-COV-2 Vaccination 10/30/2019, 11/20/2019   Pneumococcal Conjugate-13 06/02/2014   Pneumococcal Polysaccharide-23 08/10/2005, 04/21/2008, 06/25/2013   Td 10/11/2005   Zoster Recombinat (Shingrix) 06/25/2018, 08/28/2018   Zoster, Live 09/16/2014    TDAP status: Due, Education has been provided regarding the importance of this vaccine. Advised may receive this vaccine at local pharmacy or Health Dept. Aware to provide a copy of the vaccination record if obtained from local pharmacy or Health Dept. Verbalized acceptance and understanding.  Flu Vaccine status: Up to date  Pneumococcal vaccine status: Up to date  Covid-19 vaccine status: Completed vaccines  Qualifies for Shingles Vaccine? Yes   Zostavax completed Yes   Shingrix Completed?: Yes  Screening Tests Health Maintenance  Topic Date Due   COVID-19 Vaccine (4 - Pfizer series) 04/23/2021   INFLUENZA VACCINE  05/10/2022   TETANUS/TDAP  10/10/2025 (Originally 10/12/2015)   MAMMOGRAM  04/12/2023   COLONOSCOPY (Pts 45-59yr Insurance coverage will need to be confirmed)  05/24/2026   Pneumonia Vaccine 77 Years old  Completed   DEXA SCAN  Completed   Hepatitis C Screening  Completed   Zoster Vaccines- Shingrix  Completed   HPV VACCINES  Aged Out    Health Maintenance  Health Maintenance Due  Topic Date Due   COVID-19 Vaccine (4 - Pfizer series) 04/23/2021   INFLUENZA VACCINE  05/10/2022    Colorectal cancer screening: Type of screening: Colonoscopy. Completed 05/24/21. Repeat every 5 years  Mammogram status: Completed 04/11/22. Repeat every year  Bone Density status: Completed 06/12/18. Results reflect: Bone density results: OSTEOPENIA. Repeat every 5 years.  Lung Cancer Screening: (Low Dose CT Chest recommended if Age 77-80years, 30 pack-year currently smoking OR have  quit w/in 15years.) does not qualify.    Additional Screening:  Hepatitis C Screening: does qualify; Completed 07/22/19  Vision Screening: Recommended annual ophthalmology exams for early detection of glaucoma and other disorders of the eye. Is the patient up  to date with their annual eye exam?  Yes  Who is the provider or what is the name of the office in which the patient attends annual eye exams? Dr.Tanner If pt is not established with a provider, would they like to be referred to a provider to establish care? No .   Dental Screening: Recommended annual dental exams for proper oral hygiene  Community Resource Referral / Chronic Care Management: CRR required this visit?  No   CCM required this visit?  No      Plan:     I have personally reviewed and noted the following in the patient's chart:   Medical and social history Use of alcohol, tobacco or illicit drugs  Current medications and supplements including opioid prescriptions. Patient is not currently taking opioid prescriptions. Functional ability and status Nutritional status Physical activity Advanced directives List of other physicians Hospitalizations, surgeries, and ER visits in previous 12 months Vitals Screenings to include cognitive, depression, and falls Referrals and appointments  In addition, I have reviewed and discussed with patient certain preventive protocols, quality metrics, and best practice recommendations. A written personalized care plan for preventive services as well as general preventive health recommendations were provided to patient.     Dionisio David, LPN   2/64/1583   Nurse Notes: none

## 2022-06-24 ENCOUNTER — Ambulatory Visit (INDEPENDENT_AMBULATORY_CARE_PROVIDER_SITE_OTHER): Payer: Medicare Other | Admitting: Family Medicine

## 2022-06-24 ENCOUNTER — Encounter: Payer: Self-pay | Admitting: Family Medicine

## 2022-06-24 VITALS — BP 144/82 | HR 83 | Temp 98.3°F | Ht 64.5 in | Wt 148.0 lb

## 2022-06-24 DIAGNOSIS — Z952 Presence of prosthetic heart valve: Secondary | ICD-10-CM

## 2022-06-24 DIAGNOSIS — Z7189 Other specified counseling: Secondary | ICD-10-CM | POA: Diagnosis not present

## 2022-06-24 DIAGNOSIS — E538 Deficiency of other specified B group vitamins: Secondary | ICD-10-CM

## 2022-06-24 DIAGNOSIS — R918 Other nonspecific abnormal finding of lung field: Secondary | ICD-10-CM

## 2022-06-24 DIAGNOSIS — I10 Essential (primary) hypertension: Secondary | ICD-10-CM | POA: Diagnosis not present

## 2022-06-24 DIAGNOSIS — I5032 Chronic diastolic (congestive) heart failure: Secondary | ICD-10-CM

## 2022-06-24 DIAGNOSIS — N182 Chronic kidney disease, stage 2 (mild): Secondary | ICD-10-CM

## 2022-06-24 DIAGNOSIS — Z2239 Carrier of other specified bacterial diseases: Secondary | ICD-10-CM

## 2022-06-24 DIAGNOSIS — E785 Hyperlipidemia, unspecified: Secondary | ICD-10-CM

## 2022-06-24 DIAGNOSIS — Z0000 Encounter for general adult medical examination without abnormal findings: Secondary | ICD-10-CM

## 2022-06-24 DIAGNOSIS — M7989 Other specified soft tissue disorders: Secondary | ICD-10-CM

## 2022-06-24 DIAGNOSIS — D696 Thrombocytopenia, unspecified: Secondary | ICD-10-CM

## 2022-06-24 DIAGNOSIS — E559 Vitamin D deficiency, unspecified: Secondary | ICD-10-CM

## 2022-06-24 DIAGNOSIS — M85859 Other specified disorders of bone density and structure, unspecified thigh: Secondary | ICD-10-CM

## 2022-06-24 DIAGNOSIS — M545 Low back pain, unspecified: Secondary | ICD-10-CM

## 2022-06-24 DIAGNOSIS — J449 Chronic obstructive pulmonary disease, unspecified: Secondary | ICD-10-CM

## 2022-06-24 DIAGNOSIS — Z7901 Long term (current) use of anticoagulants: Secondary | ICD-10-CM

## 2022-06-24 DIAGNOSIS — I6523 Occlusion and stenosis of bilateral carotid arteries: Secondary | ICD-10-CM

## 2022-06-24 DIAGNOSIS — I4819 Other persistent atrial fibrillation: Secondary | ICD-10-CM

## 2022-06-24 DIAGNOSIS — R768 Other specified abnormal immunological findings in serum: Secondary | ICD-10-CM

## 2022-06-24 DIAGNOSIS — F4321 Adjustment disorder with depressed mood: Secondary | ICD-10-CM

## 2022-06-24 DIAGNOSIS — N3946 Mixed incontinence: Secondary | ICD-10-CM

## 2022-06-24 DIAGNOSIS — G8929 Other chronic pain: Secondary | ICD-10-CM

## 2022-06-24 DIAGNOSIS — M4810 Ankylosing hyperostosis [Forestier], site unspecified: Secondary | ICD-10-CM

## 2022-06-24 NOTE — Assessment & Plan Note (Signed)
Preventative protocols reviewed and updated unless pt declined. Discussed healthy diet and lifestyle.  

## 2022-06-24 NOTE — Assessment & Plan Note (Signed)
Has seen neurosurgery - no surgical intervention ([facility]).

## 2022-06-24 NOTE — Assessment & Plan Note (Signed)
Chronic, present over the past 2+ years.  R venous US 2021 negative for DVT or baker's cyst.  Continue to monitor.

## 2022-06-24 NOTE — Assessment & Plan Note (Signed)
Chronic, stable only on lasix.

## 2022-06-24 NOTE — Assessment & Plan Note (Signed)
Some concern per neuropsychologist for untreated mood difficulty predominant depressed mood. Agrees to counseling - referral placed. Will defer medication at this time given low PHQ9 score.

## 2022-06-24 NOTE — Assessment & Plan Note (Signed)
Sees urology s/p InterStim and now on Gemtesa

## 2022-06-24 NOTE — Assessment & Plan Note (Signed)
Stable period on vitamin B12 twice weekly.

## 2022-06-24 NOTE — Assessment & Plan Note (Signed)
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 Has seen thoracic surgery and rheumatology.

## 2022-06-24 NOTE — Assessment & Plan Note (Signed)
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  Has seen thoracic surgery and rheumatology as well.

## 2022-06-24 NOTE — Progress Notes (Signed)
Patient ID: [name], female    DOB: 11/12/1945, 76 y.o.   MRN: [id number]  This visit was conducted in person.  BP (!) 144/82 (BP Location: Right Arm, Cuff Size: Normal)   Pulse 83   Temp 98.3 F (36.8 C) (Temporal)   Ht 5' 4.5" (1.638 m)   Wt 148 lb (67.1 kg)   SpO2 96%   BMI 25.01 kg/m    CC: CPE Subjective:   HPI: [name] is a 76 y.o. female presenting on 06/24/2022 for Annual Exam [facility] prt 2.  Wants to discuss new COVID booster and RSV vaccine. )   Saw health advisor yesterday for medicare wellness visit. Note reviewed.  No results found.  Flowsheet Row Clinical Support from 06/23/2022 in [facility] [place]  PHQ-2 Total Score 1          06/23/2022    2:28 PM 06/21/2022   12:26 PM 11/19/2021    9:02 AM 06/16/2021    8:49 AM 06/10/2020    9:26 AM  Fall Risk   Falls in the past year? 0 0 0 1 0  Number falls in past yr: 0 0 0 1   Injury with Fall? 0 0 0 1   Comment    Abrasions   Risk for fall due to : No Fall Risks;History of fall(s)      Follow up Falls prevention discussed;Falls evaluation completed       S/p neuropsychological testing Dr Ilda Mori 04/2022 - recommended testing for anxiety/depression, consider counseling. Feelings of loneliness and depression.   Chronic R>L pedal edema, without pain. No redness or warmth of leg.   Preventative: Colonoscopy 05/2021 - diverticulosis and patent colon anastomosis with some edema/narrowing, rpt 5 yrs Ardis Hughs) Well woman - none recently. Had normal paps in past and would like to age out of cervical cancer screening.  [facility]  DEXA - T -1.7 hip (06/2018). Doesn't drink milk. Eats yogurt regularly. She takes calcium and vitamin D regularly. Back limits weight bearing exercise.  Lung cancer screening - not eligible  Flu shot yearly  COVID vaccine - Pfizer 10/2019, 11/2019, Moderna booster 08/2020, 02/2021, bivalent 06/2021 - planning to repeat this season.   Will get RSV this season.  Pneumovax 2009, 06/2013, prevnar-13 05/2014  Td 2005/12/31  zostavax - 09/2014  shingrix - 06/2018, 08/2018  Advanced directives: scanned and in chart 10/2014. Has living will at home. HCPOA is husband (deceased) then son. Has updated at home, asked to bring [place] copy.  Seat belt use discussed.  Sunscreen use discussed. No changing moles on skin.  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 Husband died 2015-04-03 MVA. 1 cat.  2 sons; 1 grandchild. One son died of MI 86 Married 12/31/64  Retired-[employer], volunteers at [employer], to start at [facility] in [place] Activity: pulm rehab  Diet: some water, fruits/vegetables daily, yogurt daily     Relevant past medical, surgical, family and social history reviewed and updated as indicated. Interim medical history since our last visit reviewed. Allergies and medications reviewed and updated. Outpatient Medications Prior to Visit  Medication Sig Dispense Refill   acetaminophen (TYLENOL) 500 MG tablet Take 2 tablets (1,000 mg  total) by mouth daily.     albuterol (VENTOLIN HFA) 108 (90 Base) MCG/ACT inhaler Inhale 2 puffs into the lungs every 6 (six) hours as needed for wheezing. 18 g 6   apixaban (ELIQUIS) 5 MG TABS tablet Take 1 tablet (5 mg total) by mouth 2 (two) times daily. 60 tablet 11   Biotin 1000 MCG tablet Take 1,000 mcg by mouth daily.     calcium carbonate (OSCAL) 1500 (600 Ca) MG TABS tablet Take 600 mg of elemental calcium by mouth daily.     carboxymethylcellulose (REFRESH PLUS) 0.5 % SOLN Place 1 drop into both eyes 3 (three) times daily as needed (dry eyes).     cephALEXin (KEFLEX) 250 MG capsule Take 250 mg by mouth daily.     Cholecalciferol (VITAMIN D) 50 MCG  (2000 UT) tablet Take 2,000 Units by mouth daily.     docusate sodium (COLACE) 100 MG capsule Take 200 mg by mouth every evening.     Fluticasone-Umeclidin-Vilant (TRELEGY ELLIPTA) 200-62.5-25 MCG/ACT AEPB INHALE 1 PUFF INTO THE LUNGS DAILY 60 each 11   furosemide (LASIX) 40 MG tablet Take 1 tablet (40 mg total) by mouth daily. May take an extra tablet daily as needed for swelling. 45 tablet 11   gabapentin (NEURONTIN) 300 MG capsule Take 300 mg by mouth at bedtime.     ipratropium-albuterol (DUONEB) 0.5-2.5 (3) MG/3ML SOLN Take 3 mLs by nebulization every 6 (six) hours as needed. 360 mL 0   lovastatin (MEVACOR) 40 MG tablet TAKE 1 TABLET(40 MG) BY MOUTH AT BEDTIME 90 tablet 1   montelukast (SINGULAIR) 10 MG tablet TAKE 1 TABLET(10 MG) BY MOUTH AT BEDTIME 90 tablet 1   Multiple Vitamins-Minerals (PRESERVISION AREDS 2 PO) Take 2 capsules by mouth every evening.      nitrofurantoin, macrocrystal-monohydrate, (MACROBID) 100 MG capsule Take 100 mg by mouth 2 (two) times daily.     polyethylene glycol (MIRALAX / GLYCOLAX) packet Take 17 g by mouth daily. 14 each 0   potassium chloride (KLOR-CON) 10 MEQ tablet TAKE 2 TABLETS(20 MEQ) BY MOUTH DAILY 180 tablet 3   silver sulfADIAZINE (SILVADENE) 1 % cream Apply 1 application topically daily. 50 g 1   traMADol (ULTRAM) 50 MG tablet Take 1 tablet (50 mg total) by mouth 2 (two) times daily as needed. 40 tablet 0   traZODone (DESYREL) 50 MG tablet TAKE 1/2 TO 1 TABLET(25 TO 50 MG) BY MOUTH AT BEDTIME 30 tablet 2   Vibegron (GEMTESA) 75 MG TABS Take 75 mg by mouth daily.     vitamin B-12 (CYANOCOBALAMIN) 500 MCG tablet Take 1 tablet (500 mcg total) by mouth 2 (two) times a week.     Zinc 50 MG CAPS Take 1 capsule (50 mg total) by mouth daily.  0   Facility-Administered Medications Prior to Visit  Medication Dose Route Frequency Provider Last Rate Last Admin   ondansetron (ZOFRAN) 4 mg in sodium chloride 0.9 % 50 mL IVPB  4 mg Intravenous Q6H PRN Kristeen Miss, MD         Per HPI unless specifically indicated in ROS section below Review of Systems  Constitutional:  Negative for activity change, appetite change, chills, fatigue, fever and unexpected weight change.  HENT:  Negative for hearing loss.   Eyes:  Negative for visual disturbance.  Respiratory:  Negative for cough, chest tightness, shortness of breath and wheezing.   Cardiovascular:  Positive for leg swelling (R>L). Negative for chest pain and palpitations.  Gastrointestinal:  Negative for abdominal distention, abdominal pain, blood in stool, constipation, diarrhea, nausea and vomiting.  Genitourinary:  Negative for difficulty urinating and hematuria.  Musculoskeletal:  Negative for arthralgias, myalgias and neck pain.  Skin:  Negative for rash.  Neurological:  Positive for dizziness (occ). Negative for seizures, syncope and headaches.  Hematological:  Negative for adenopathy. Bruises/bleeds easily.  Psychiatric/Behavioral:  Positive for dysphoric mood. The patient is not nervous/anxious.     Objective:  BP (!) 144/82 (BP Location: Right Arm, Cuff Size: Normal)   Pulse 83   Temp 98.3 F (36.8 C) (Temporal)   Ht 5' 4.5" (1.638 m)   Wt 148 lb (67.1 kg)   SpO2 96%   BMI 25.01 kg/m   Wt Readings from Last 3 Encounters:  06/24/22 148 lb (67.1 kg)  06/23/22 152 lb (68.9 kg)  05/30/22 152 lb 6.4 oz (69.1 kg)      Physical Exam Vitals and nursing note reviewed.  Constitutional:      Appearance: Normal appearance. She is not ill-appearing.  HENT:     Head: Normocephalic and atraumatic.     Right Ear: Tympanic membrane, ear canal and external ear normal. There is no impacted cerumen.     Left Ear: Tympanic membrane, ear canal and external ear normal. There is no impacted cerumen.  Eyes:     General:        Right eye: No discharge.        Left eye: No discharge.     Extraocular Movements: Extraocular movements intact.     Conjunctiva/sclera: Conjunctivae normal.      Pupils: Pupils are equal, round, and reactive to light.  Neck:     Thyroid: No thyroid mass or thyromegaly.  Cardiovascular:     Rate and Rhythm: Normal rate and regular rhythm.     Pulses: Normal pulses.     Heart sounds: Normal heart sounds. No murmur heard. Pulmonary:     Effort: Pulmonary effort is normal. No respiratory distress.     Breath sounds: Normal breath sounds. No wheezing, rhonchi or rales.  Abdominal:     General: Bowel sounds are normal. There is no distension.     Palpations: Abdomen is soft. There is no mass.     Tenderness: There is no abdominal tenderness. There is no guarding or rebound.     Hernia: No hernia is present.  Musculoskeletal:        General: Swelling present.     Cervical back: Normal range of motion and neck supple. No rigidity.     Right lower leg: Edema present.     Left lower leg: No edema.     Comments: R>L pedal edema, chronic  Lymphadenopathy:     Cervical: No cervical adenopathy.  Skin:    General: Skin is warm and dry.     Findings: No rash.  Neurological:     General: No focal deficit present.     Mental Status: She is alert. Mental status is at baseline.  Psychiatric:        Mood and Affect: Mood normal.        Behavior: Behavior normal.       Results for orders placed or performed in visit on 06/22/22  Microalbumin / creatinine urine ratio  Result Value Ref Range   Microalb, Ur 41.3 (H) 0.0 - 1.9 mg/dL   Creatinine,U 145.8 mg/dL   Microalb Creat Ratio 28.3 0.0 - 30.0 mg/g  CBC with Differential/Platelet  Result Value Ref Range  WBC 6.8 4.0 - 10.5 K/uL   RBC 4.20 3.87 - 5.11 Mil/uL   Hemoglobin 14.0 12.0 - 15.0 g/dL   HCT 42.6 36.0 - 46.0 %   MCV 101.4 (H) 78.0 - 100.0 fl   MCHC 32.8 30.0 - 36.0 g/dL   RDW 14.5 11.5 - 15.5 %   Platelets 96.0 (L) 150.0 - 400.0 K/uL   Neutrophils Relative % 77.7 (H) 43.0 - 77.0 %   Lymphocytes Relative 9.7 (L) 12.0 - 46.0 %   Monocytes Relative 10.0 3.0 - 12.0 %   Eosinophils Relative  2.1 0.0 - 5.0 %   Basophils Relative 0.5 0.0 - 3.0 %   Neutro Abs 5.3 1.4 - 7.7 K/uL   Lymphs Abs 0.7 0.7 - 4.0 K/uL   Monocytes Absolute 0.7 0.1 - 1.0 K/uL   Eosinophils Absolute 0.1 0.0 - 0.7 K/uL   Basophils Absolute 0.0 0.0 - 0.1 K/uL  Comprehensive metabolic panel  Result Value Ref Range   Sodium 143 135 - 145 mEq/L   Potassium 3.7 3.5 - 5.1 mEq/L   Chloride 102 96 - 112 mEq/L   CO2 31 19 - 32 mEq/L   Glucose, Bld 84 70 - 99 mg/dL   BUN 17 6 - 23 mg/dL   Creatinine, Ser 0.94 0.40 - 1.20 mg/dL   Total Bilirubin 0.6 0.2 - 1.2 mg/dL   Alkaline Phosphatase 85 39 - 117 U/L   AST 22 0 - 37 U/L   ALT 15 0 - 35 U/L   Total Protein 7.0 6.0 - 8.3 g/dL   Albumin 3.7 3.5 - 5.2 g/dL   GFR 58.75 (L) >60.00 mL/min   Calcium 9.6 8.4 - 10.5 mg/dL  Lipid panel  Result Value Ref Range   Cholesterol 154 0 - 200 mg/dL   Triglycerides 88.0 0.0 - 149.0 mg/dL   HDL 61.70 >39.00 mg/dL   VLDL 17.6 0.0 - 40.0 mg/dL   LDL Cholesterol 75 0 - 99 mg/dL   Total CHOL/HDL Ratio 2    NonHDL 92.37   VITAMIN D 25 Hydroxy (Vit-D Deficiency, Fractures)  Result Value Ref Range   VITD 63.83 30.00 - 100.00 ng/mL  Vitamin B12  Result Value Ref Range   Vitamin B-12 535 211 - 911 pg/mL   *Note: Due to a large number of results and/or encounters for the requested time period, some results have not been displayed. A complete set of results can be found in Results Review.      06/23/2022    2:24 PM 06/16/2021    8:49 AM 06/10/2020    9:26 AM 05/02/2019    8:33 AM 11/15/2017   10:18 AM  Depression screen PHQ 2/9  Decreased Interest 0 0 0 0 1  Down, Depressed, Hopeless 1 0 0 0 1  PHQ - 2 Score 1 0 0 0 2  Altered sleeping 2    3  Tired, decreased energy 2    2  Change in appetite 0    3  Feeling bad or failure about yourself  0    0  Trouble concentrating 1    0  Moving slowly or fidgety/restless 0    0  Suicidal thoughts 0    0  PHQ-9 Score 6    10  Difficult doing work/chores Somewhat difficult    Somewhat  difficult   Assessment & Plan:   Problem List Items Addressed This Visit     Health maintenance examination - Primary (Chronic)    Preventative  protocols reviewed and updated unless pt declined. Discussed healthy diet and lifestyle.       HLD (hyperlipidemia)    Chronic, stable on lovastatin - continue. The 10-year ASCVD risk score (Arnett DK, et al., 2019) is: 28.4%   Values used to calculate the score:     Age: 64 years     Sex: Female     Is Non-Hispanic African American: No     Diabetic: No     Tobacco smoker: No     Systolic Blood Pressure: 254 mmHg     Is BP treated: Yes     HDL Cholesterol: 61.7 mg/dL     Total Cholesterol: 154 mg/dL       Essential hypertension, benign    Chronic, stable only on lasix.       COPD (chronic obstructive pulmonary disease)    Regularly sees pulmonology, on trelegy and singulair with PRN duoneb.       Chronic lower back pain    Has seen neurosurgery - no surgical intervention ([facility]).       S/P aortic and mitral valve bioprostheses   Chronic diastolic heart failure   Adjustment disorder with depressed mood    Some concern per neuropsychologist for untreated mood difficulty predominant depressed mood. Agrees to counseling - referral placed. Will defer medication at this time given low PHQ9 score.       Relevant Orders   Ambulatory referral to Psychology   Osteopenia    Plan for DEXA 2024  Weight bearing exercise limited by back pain.       Vitamin B12 deficiency    Stable period on vitamin B12 twice weekly.        Mixed stress and urge urinary incontinence    Sees urology s/p InterStim and now on Gemtesa      CKD (chronic kidney disease) stage 2, GFR 60-89 ml/min    Stable period with latest GFR 58, however Umicroalb trending up - continue to monitor, consider ACEI/ARB.       Vitamin D deficiency    Continue daily vitamin D replacement.       Pulmonary infiltrates    Chronic atypical lung infection followed by  pulm.  Has seen thoracic surgery and rheumatology as well.       DISH (diffuse idiopathic skeletal hyperostosis)   Anticoagulant long-term use   Thrombocytopenia    Chronic, overall stable.       Right leg swelling    Chronic, present over the past 2+ years.  R venous US 2021 negative for DVT or baker's cyst.  Continue to monitor.       Carotid stenosis   Persistent atrial fibrillation    Continue full dose eliquis.       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 Has seen thoracic surgery and rheumatology.       Positive ANA (antinuclear antibody)    S/p reassuring rheumatologic evaluation 05/2022.       RESOLVED: Advanced directives, counseling/discussion     No orders of the defined types were placed in this encounter.  Orders Placed This Encounter  Procedures   Ambulatory referral to Psychology    Referral Priority:   Routine    Referral Type:   Psychiatric    Referral Reason:   Specialty Services Required    Requested Specialty:   Psychology    Number of Visits Requested:   1    Patient instructions: We will refer you  to Santiago Glad our Social worker.  Get covid booster, RSV vaccine, flu shot at pharmacy this fall.  You could try gasX for gas. Watch diet for triggers.  Good to see you today  Happy early birthday!  Return to see me in 1 year for next wellness visit/physical.   Follow up plan: Return in about 1 year (around 06/25/2023) for annual exam, prior fasting for blood work, medicare wellness visit.  Ria Bush, MD

## 2022-06-24 NOTE — Assessment & Plan Note (Signed)
Continue full dose eliquis.

## 2022-06-24 NOTE — Assessment & Plan Note (Addendum)
Stable period with latest GFR 58, however Umicroalb trending up - continue to monitor, consider ACEI/ARB.

## 2022-06-24 NOTE — Assessment & Plan Note (Signed)
Continue daily vitamin D replacement.

## 2022-06-24 NOTE — Assessment & Plan Note (Addendum)
S/p reassuring rheumatologic evaluation 05/2022.

## 2022-06-24 NOTE — Assessment & Plan Note (Signed)
Regularly sees pulmonology, on trelegy and singulair with PRN duoneb.

## 2022-06-24 NOTE — Assessment & Plan Note (Signed)
Chronic, stable on lovastatin - continue. The 10-year ASCVD risk score (Arnett DK, et al., 2019) is: 28.4%   Values used to calculate the score:     Age: 76 years     Sex: Female     Is Non-Hispanic African American: No     Diabetic: No     Tobacco smoker: No     Systolic Blood Pressure: 984 mmHg     Is BP treated: Yes     HDL Cholesterol: 61.7 mg/dL     Total Cholesterol: 154 mg/dL

## 2022-06-24 NOTE — Assessment & Plan Note (Signed)
Chronic, overall stable.

## 2022-06-24 NOTE — Assessment & Plan Note (Signed)
Plan for DEXA 2024  Weight bearing exercise limited by back pain.

## 2022-06-24 NOTE — Patient Instructions (Addendum)
We will refer you to Santiago Glad our counselor.  Get covid booster, RSV vaccine, flu shot at pharmacy this fall.  You could try gasX for gas. Watch diet for triggers.  Good to see you today  Happy early birthday!  Return to see me in 1 year for next wellness visit/physical.   Health Maintenance After Age 77 After age 47, you are at a higher risk for certain long-term diseases and infections as well as injuries from falls. Falls are a major cause of broken bones and head injuries in people who are older than age 43. Getting regular preventive care can help to keep you healthy and well. Preventive care includes getting regular testing and making lifestyle changes as recommended by your health care provider. Talk with your health care provider about: Which screenings and tests you should have. A screening is a test that checks for a disease when you have no symptoms. A diet and exercise plan that is right for you. What should I know about screenings and tests to prevent falls? Screening and testing are the best ways to find a health problem early. Early diagnosis and treatment give you the best chance of managing medical conditions that are common after age 9. Certain conditions and lifestyle choices may make you more likely to have a fall. Your health care provider may recommend: Regular vision checks. Poor vision and conditions such as cataracts can make you more likely to have a fall. If you wear glasses, make sure to get your prescription updated if your vision changes. Medicine review. Work with your health care provider to regularly review all of the medicines you are taking, including over-the-counter medicines. Ask your health care provider about any side effects that may make you more likely to have a fall. Tell your health care provider if any medicines that you take make you feel dizzy or sleepy. Strength and balance checks. Your health care provider may recommend certain tests to check your  strength and balance while standing, walking, or changing positions. Foot health exam. Foot pain and numbness, as well as not wearing proper footwear, can make you more likely to have a fall. Screenings, including: Osteoporosis screening. Osteoporosis is a condition that causes the bones to get weaker and break more easily. Blood pressure screening. Blood pressure changes and medicines to control blood pressure can make you feel dizzy. Depression screening. You may be more likely to have a fall if you have a fear of falling, feel depressed, or feel unable to do activities that you used to do. Alcohol use screening. Using too much alcohol can affect your balance and may make you more likely to have a fall. Follow these instructions at home: Lifestyle Do not drink alcohol if: Your health care provider tells you not to drink. If you drink alcohol: Limit how much you have to: 0-1 drink a day for women. 0-2 drinks a day for men. Know how much alcohol is in your drink. In the [place] one drink equals one 12 oz bottle of beer (355 mL), one 5 oz glass of wine (148 mL), or one 1 oz glass of hard liquor (44 mL). Do not use any products that contain nicotine or tobacco. These products include cigarettes, chewing tobacco, and vaping devices, such as e-cigarettes. If you need help quitting, ask your health care provider. Activity  Follow a regular exercise program to stay fit. This will help you maintain your balance. Ask your health care provider what types of exercise are appropriate  for you. If you need a cane or walker, use it as recommended by your health care provider. Wear supportive shoes that have nonskid soles. Safety  Remove any tripping hazards, such as rugs, cords, and clutter. Install safety equipment such as grab bars in bathrooms and safety rails on stairs. Keep rooms and walkways well-lit. General instructions Talk with your health care provider about your risks for falling. Tell your  health care provider if: You fall. Be sure to tell your health care provider about all falls, even ones that seem minor. You feel dizzy, tiredness (fatigue), or off-balance. Take over-the-counter and prescription medicines only as told by your health care provider. These include supplements. Eat a healthy diet and maintain a healthy weight. A healthy diet includes low-fat dairy products, low-fat (lean) meats, and fiber from whole grains, beans, and lots of fruits and vegetables. Stay current with your vaccines. Schedule regular health, dental, and eye exams. Summary Having a healthy lifestyle and getting preventive care can help to protect your health and wellness after age 60. Screening and testing are the best way to find a health problem early and help you avoid having a fall. Early diagnosis and treatment give you the best chance for managing medical conditions that are more common for people who are older than age 64. Falls are a major cause of broken bones and head injuries in people who are older than age 84. Take precautions to prevent a fall at home. Work with your health care provider to learn what changes you can make to improve your health and wellness and to prevent falls. This information is not intended to replace advice given to you by your health care provider. Make sure you discuss any questions you have with your health care provider. Document Revised: 02/15/2021 Document Reviewed: 02/15/2021 Elsevier Patient Education  [employer].

## 2022-06-24 NOTE — Assessment & Plan Note (Deleted)
Continues full dose eliquis

## 2022-06-25 ENCOUNTER — Encounter: Payer: Self-pay | Admitting: Family Medicine

## 2022-06-29 ENCOUNTER — Encounter: Payer: Medicare Other | Admitting: Family Medicine

## 2022-07-10 DIAGNOSIS — D0439 Carcinoma in situ of skin of other parts of face: Secondary | ICD-10-CM

## 2022-07-10 HISTORY — DX: Carcinoma in situ of skin of other parts of face: D04.39

## 2022-07-13 ENCOUNTER — Other Ambulatory Visit: Payer: Self-pay

## 2022-07-13 MED ORDER — LOVASTATIN 40 MG PO TABS: ORAL_TABLET | ORAL | 1 refills | Status: DC

## 2022-07-18 ENCOUNTER — Other Ambulatory Visit: Payer: Self-pay | Admitting: Family Medicine

## 2022-07-18 NOTE — Telephone Encounter (Signed)
ERx 

## 2022-07-18 NOTE — Telephone Encounter (Signed)
Name of Medication: Tramadol Name of Pharmacy: [facility] [place] or Written Date and Quantity: 06/17/22, #40 Last Office Visit and Type: 01/03/22, f/u Next Office Visit and Type: 06/24/22, CPE Last Controlled Substance Agreement Date: none     Last UDS: none

## 2022-07-21 ENCOUNTER — Encounter: Payer: Self-pay | Admitting: Cardiovascular Disease

## 2022-07-21 MED ORDER — FUROSEMIDE 40 MG PO TABS: 40.0000 mg | ORAL_TABLET | Freq: Every day | ORAL | 2 refills | Status: DC

## 2022-07-22 ENCOUNTER — Encounter: Payer: Self-pay | Admitting: Family Medicine

## 2022-07-27 ENCOUNTER — Ambulatory Visit (INDEPENDENT_AMBULATORY_CARE_PROVIDER_SITE_OTHER): Payer: Medicare Other | Admitting: Clinical

## 2022-07-27 DIAGNOSIS — F4323 Adjustment disorder with mixed anxiety and depressed mood: Secondary | ICD-10-CM | POA: Diagnosis not present

## 2022-07-27 NOTE — Progress Notes (Signed)
                Khamari Yousuf, LCSW 

## 2022-07-27 NOTE — Progress Notes (Signed)
[facility] Counselor Initial Adult Exam  Name: [name] Date: 07/27/2022 MRN: [id number] DOB: 01/18/1946 PCP: Ria Bush, MD  Time spent: 10:37am-11:21am  Guardian/Payee:  NA    Paperwork requested:  NA  Reason for Visit /Presenting Problem: Patient reported as a result of a recent neuropsychological evaluation it was recommended patient participate in therapy for treatment of anxiety and depression. In addition, patient reported she followed up wit her primary care provider and he recommended therapy. Patient reported she was referred for a neuropsychological evaluation due to memory loss. Patient reported she lost her son and husband within 6 months of each other. Patient reported she has "only one slight recollection of their passing" and wants to remember the situation surrounding their deaths. Patient stated, "I want to remember important things and I just cant".   Mental Status Exam: Appearance:   Neat     Behavior:  Appropriate  Motor:  Normal  Speech/Language:   Clear and Coherent  Affect:  Appropriate  Mood:  normal  Thought process:  normal  Thought content:    WNL  Sensory/Perceptual disturbances:    WNL  Orientation:  oriented to person, place, and day of week  Attention:  Good  Concentration:  Good  Memory:  WNL  Fund of knowledge:   Good  Insight:    Good  Judgment:   Good  Impulse Control:  Good   Reported Symptoms:  Patient stated, "I don't sleep well" and reported recent weight gain, recent increase in appetite, difficulty staying asleep, decreased energy, stated "I stay tired", "fair" mood most days, depressed mood some days, changes in memory, doesn't want to go out to perform tasks, feelings of loneliness, feeling anxious/nervous, worry some days. Patient reported symptoms started after death of husband and son. Patient reported her husband and son's deaths occurred in 2015/2016.   Risk Assessment: Danger to Self:  No Patient  denied current or past suicidal ideation Self-injurious Behavior: No Danger to Others: No Patient denied current or past suicidal ideation Duty to Warn:no Physical Aggression / Violence:No  Access to Firearms a concern:  no but, patient reported she has a firearm at home [place] Involvement:No  Patient / guardian was educated about steps to take if suicide or homicide risk level increases between visits: yes While future psychiatric events cannot be accurately predicted, the patient does not currently require acute inpatient psychiatric care and does not currently meet [facility] involuntary commitment criteria.  Substance Abuse History: Current substance abuse: No  Patient reported history of tobacco use but reported no use since 1976. Patient reported drinking a glass of wine infrequently. Patient reported no current or past drug use.  Past Psychiatric History:   No previous psychological problems have been observed Outpatient Providers: none History of Psych Hospitalization: No  Psychological Testing:  recent neuropsychological evaluation on 04/22/22    Abuse History:  Victim of: No.,  none    Report needed: No. Victim of Neglect:No. Perpetrator of  none   Witness / Exposure to Domestic Violence: No   Protective Services Involvement: No  Witness to [employer] Violence:  No   Family History:  Family History  Problem Relation Age of Onset   Heart disease Mother    Diabetes Mother    Hypertension Mother    Stroke Mother    Lung cancer Father    Breast cancer Maternal Aunt 83   Heart attack Maternal Grandfather    CAD Son 73   Heart attack Son 54  Rectal cancer Neg Hx    Stomach cancer Neg Hx    Esophageal cancer Neg Hx    Colon cancer Neg Hx     Living situation: the patient lives alone  Sexual Orientation: Straight  Relationship Status: widowed  Name of spouse / other: Phebe Colla - killed in an automobile accident If a parent, number of children / ages: 2 sons (1 adult  son lives in [place], 1 adult son died from a massive heart attack)  Support Systems: friends  Museum/gallery curator Stress:  No   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 Known DISH with thoracic spine fusion and moderate kyphosis, h/o L spine fusion L2-5   Adjustment disorder with depressed mood 2014/11/10   60 yo son died MI 10-14-14 Husband died car accident 04/15/2015   Anemia    Anticoagulant long-term use 06/14/2019   She takes warfarin for afib/flutter with CHADS2VASc score of 4 (age, sex, CHF, HTN), s/p 2 bioprosthetic valve replacements Coumadin changed to eliquis 2022 per cardiology   Aortic atherosclerosis 03/03/2022   Noted on 01/2022 chest CT   Arthritis    Arthropathy of spinal facet joint 03/08/2018   Ascending aortic aneurysm 03/02/2021   4.1cm on CT 01/2021 rec yearly monitoring   Bilateral hip pain 09/08/2016   CAP (community acquired pneumonia) 02/08/2016   Carotid stenosis 12/08/2020   Carotid US - 40-59% BICA  stenosis (2015) Carotid US 12/2020 - 1-39% BICA stenosis, incidental 3.4cm structure behind L common carotid artery rec further imaging    Cataracts, bilateral    immature   Chest pain 08/25/2014   myoview low risk 02/2018 ([place])   Chronic cholecystitis with calculus 01/21/2015   S/p cholecystectomy    Chronic diastolic heart failure 16/07/9603   Chronic insomnia    Chronic lower back pain 03/05/2008   Returned to Dr Ellene Route - translaminar ESI at L3/4. Myelogram showed significant left sided foraminal stenosis at L2/3 and L3/4 in setting of solid arthrodesis, planned DEXA and if stable osteopenia, considering anterolateral decompression with spacer.    CKD (chronic kidney disease) stage 2, GFR 60-89 ml/min 07/03/2015   Constipation    takes Miralax daily as needed   COPD (chronic obstructive pulmonary disease)    Albuterol inhaler daily as needed;Duoneb daily as needed;Spiriva daily   DDD (degenerative disc disease)    cervical - kyphosis with mod DD changes C5/6 and C6/7; lumbar - early DD at L2/3 (Elsner)   DISH (diffuse idiopathic skeletal hyperostosis) 05/14/2019   By MRI noted fusion of lower thoracic vertebrae - likely contributing to unsteadiness (Elsner)   Dyspnea on exertion  06/05/2015   myoview low risk 02/2018   Essential hypertension, benign 03/24/2009   GERD (gastroesophageal reflux disease)    takes Protonix daily   Hematuria 06/17/2021   History of radiation therapy 05/30/11 to 07/07/11   rectum   History of rectal cancer 05/26/2011   Rectal cancer 2012, midrectum [id number], s/p neoadj chemo&XRT, lap LAR with diverting loop ileostomy 08/2011, ileostomy takedown 03/2012 Discharged from oncology clinic 2017 Benay Spice)   History of shingles    History of vertebral fracture 11/26/2019   Old L1 vertebral compression fracture incidentally noted on imaging 2020    HLD (hyperlipidemia) 05/03/2007   Hyperlipemia    takes Lovastatin daily   Lacunar infarction    Left caudate    Left hip pain 07/29/2020   Legally blind in left eye    Lung nodule    RLL nodule-30m stable 2006, April 2009, and June 2009   Lymphangioma 01/07/2021   Noted incidentally on carotid [place]/2022 CT neck - Likely benign lymphangioma 01/2021   Macrocytosis 03/27/2022   Folate and b12 checked 2023   Malaise and fatigue 01/22/2018   Meralgia paresthetica 12/13/2007   Mixed stress and urge urinary incontinence 03/16/2015   Failed oxybutynin IR/ER. Vesicare initially helped but then no longer helpful.  myrbetriq was not effective.  Saw urology (MacDiarmid) - UDS largely overactive bladder with mild-mod stress incontinence. 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   Pulmonary infiltrates 04/23/2019   06/13/2018-CT super D chest without contrast- generally stable chronic lung disease with pleural parenchymal scarring and scattered nodularity most of these nodules have been tree-in-bud in appearance likely postinfectious or inflammatory the dominant right  lower lobe nodule seen on most recent study has resolved no new or enlarging nodules  04/22/2019-CT chest without contrast- waxing and waning per   Pulmonary nodule 02/23/2012   RLL nodule-688mstable 2006, April 2009, and June 2009 Small pulm nodules Jan 2012 - > Oct 2012 without change   Rheumatic heart disease mitral stenosis    mod MS by echo 02/2014   Right leg swelling 06/11/2020   R venous [place]/2021 WNL: - No evidence of deep vein thrombosis in the lower extremity. No indirect evidence of obstruction proximal to the inguinal ligament. - No cystic structure found in the popliteal fossa.   S/P aortic and mitral valve bioprostheses 12/08/2013   AVR 21 mm, MVR 25 mm-MagnaEase pericardia   S/P AVR (aortic valve replacement) 2015   bioprosthetic (Bartle)   S/P MVR (mitral valve replacement) 2015   bioprosthetic (Bartle)   Sepsis secondary to UTI ([place]11/13/2018  Severe aortic valve stenosis 10/01/2013   mild-mod by echo 02/2014   Skin lesion 01/03/2022   Small bowel obstruction, partial 08/2013   reolved without NGT placement.    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   Dr Amy Martinique   Syncope 05/28/2018   Thrombocytopenia 11/04/2019   Typical atrial flutter 05/15/2019   Vitamin B12 deficiency 02/28/2015   Start B12 shots 02/2015    Vitamin D deficiency 10/23/2016  Per patient's report 07/27/22 she has Afib and experiences incontinence  Past Surgical History:  Procedure Laterality Date   ANTERIOR LAT LUMBAR FUSION Left 07/17/2018   Procedure: Lumbar Two-Three Lumbar Three-Four Anterolateral decompression/interbody fusion with lateral plate fixation/Infuse;  Surgeon: Kristeen Miss, MD;  Location: [facility];  Service: Neurosurgery;  Laterality: Left;  Lumbar Two-Three Lumbar Three-Four Anterolateral decompression/interbody fusion with lateral plate fixation/Infuse   AORTIC VALVE REPLACEMENT N/A 12/12/2013   Procedure: AORTIC VALVE REPLACEMENT (AVR);  Surgeon: Gaye Pollack, MD; Service: Open Heart Surgery   BACK SURGERY  2006, 2007   2006 SPACER, 2007 decompression and fusion L4/5   BOWEL RESECTION  04/02/2012   Procedure: SMALL BOWEL RESECTION;  Surgeon: Stark Klein, MD;  Location: [facility];  Service: General;  Laterality: N/A;   BRONCHIAL WASHINGS  03/24/2021   Procedure: BRONCHIAL WASHINGS;  Surgeon: Brand Males, MD;  Location: [facility];  Service: Endoscopy;;   CARPAL TUNNEL RELEASE Bilateral    CATARACT EXTRACTION W/ INTRAOCULAR LENS IMPLANT Left 10/18/2016   CATARACT EXTRACTION W/ INTRAOCULAR LENS IMPLANT Right 12/13/2016   Dr. Kathrin Penner   CHOLECYSTECTOMY N/A 01/21/2015   chronic cholecystitis, Stark Klein, MD   COLON RESECTION  08/25/2011   Procedure: COLON RESECTION LAPAROSCOPIC;  Surgeon: Stark Klein, MD;  Location: [facility];  Service: General;  Laterality: N/A;  Laparoscopic Assisted Low Anterior Resection Diverting Ostomy and onQ pain pump   COLONOSCOPY  03/2013   1 polyp, rpt 3 yrs Ardis Hughs)   COLONOSCOPY  05/2021   diverticulosis and patent colon anastomosis with some edema/narrowing, rpt 5 yrs Ardis Hughs)   COLONOSCOPY WITH PROPOFOL N/A 06/09/2016   patent colo-colonic anastomosis, rpt 5 yrs Ardis Hughs)   EP IMPLANTABLE DEVICE N/A 06/16/2016   Procedure: Pacemaker Implant;  Surgeon: Will Meredith Leeds, MD;  Location: [facility];  Service: Cardiovascular;  Laterality: N/A;   FOOT SURGERY  left foot   hammer toe   ILEOSTOMY  08/25/2011   ILEOSTOMY CLOSURE  04/02/2012   Procedure: ILEOSTOMY TAKEDOWN;  Surgeon: Stark Klein, MD;  [facility]: [facility];  Service: General;  Laterality: N/A;   INTRAOPERATIVE TRANSESOPHAGEAL ECHOCARDIOGRAM N/A 12/12/2013   Procedure: INTRAOPERATIVE TRANSESOPHAGEAL ECHOCARDIOGRAM;  Surgeon: Gaye Pollack, MD;  Location: [facility];  Service: Open Heart Surgery;  Laterality: N/A;   LEFT AND RIGHT HEART CATHETERIZATION WITH CORONARY ANGIOGRAM N/A 11/27/2013   Procedure: LEFT AND RIGHT HEART CATHETERIZATION  WITH CORONARY ANGIOGRAM;  Surgeon: Sanda Klein, MD;  Location: [facility];  Service: Cardiovascular;  Laterality: N/A;   MITRAL VALVE REPLACEMENT N/A 12/12/2013   Procedure: MITRAL VALVE (MV) REPLACEMENT OR REPAIR;  Surgeon: Gaye Pollack, MD; Service: Open Heart Surgery   TEE WITHOUT CARDIOVERSION N/A 11/29/2013   Procedure: TRANSESOPHAGEAL ECHOCARDIOGRAM (TEE);  Surgeon: Sueanne Margarita, MD;  Location[facility];  Service: Cardiovascular;  Laterality: N/A;   TEE WITHOUT CARDIOVERSION N/A 06/04/2019   Procedure: TRANSESOPHAGEAL ECHOCARDIOGRAM (TEE);  Surgeon: Sanda Klein, MD;  Location: [facility];  Service: Cardiovascular;  Laterality: N/A;   TONSILLECTOMY  VIDEO BRONCHOSCOPY N/A 03/24/2021   Procedure: VIDEO BRONCHOSCOPY WITHOUT FLUORO;  Surgeon: Brand Males, MD;  [facility];  Service: Endoscopy;  Laterality: N/A;  SMALL SCOPE,PREMEDICATE WITH NEBULIZED LIDOCAINE    Medications: Current Outpatient Medications  Medication Sig Dispense Refill   acetaminophen (TYLENOL) 500 MG tablet Take 2 tablets (1,000 mg total) by mouth daily.     albuterol (VENTOLIN HFA) 108 (90 Base) MCG/ACT inhaler Inhale 2 puffs into the lungs every 6 (six) hours as needed for wheezing. 18 g 6   apixaban (ELIQUIS) 5 MG TABS tablet Take 1 tablet (5 mg total) by mouth 2 (two) times daily. 60 tablet 11   Biotin 1000 MCG tablet Take 1,000 mcg by mouth daily.     calcium carbonate (OSCAL) 1500 (600 Ca) MG TABS tablet Take 600 mg of elemental calcium by mouth daily.     carboxymethylcellulose (REFRESH PLUS) 0.5 % SOLN Place 1 drop into both eyes 3 (three) times daily as needed (dry eyes).     cephALEXin (KEFLEX) 250 MG capsule Take 250 mg by mouth daily.     Cholecalciferol (VITAMIN D) 50 MCG (2000 UT) tablet Take 2,000 Units by mouth daily.     docusate sodium (COLACE) 100 MG capsule Take 200 mg by mouth every evening.     Fluticasone-Umeclidin-Vilant (TRELEGY ELLIPTA) 200-62.5-25 MCG/ACT  AEPB INHALE 1 PUFF INTO THE LUNGS DAILY 60 each 11   furosemide (LASIX) 40 MG tablet Take 1 tablet (40 mg total) by mouth daily. May take an extra tablet daily as needed for swelling. 135 tablet 2   gabapentin (NEURONTIN) 300 MG capsule Take 300 mg by mouth at bedtime.     ipratropium-albuterol (DUONEB) 0.5-2.5 (3) MG/3ML SOLN Take 3 mLs by nebulization every 6 (six) hours as needed. 360 mL 0   lovastatin (MEVACOR) 40 MG tablet TAKE 1 TABLET(40 MG) BY MOUTH AT BEDTIME 90 tablet 1   montelukast (SINGULAIR) 10 MG tablet TAKE 1 TABLET(10 MG) BY MOUTH AT BEDTIME 90 tablet 1   Multiple Vitamins-Minerals (PRESERVISION AREDS 2 PO) Take 2 capsules by mouth every evening.      nitrofurantoin, macrocrystal-monohydrate, (MACROBID) 100 MG capsule Take 100 mg by mouth 2 (two) times daily.     polyethylene glycol (MIRALAX / GLYCOLAX) packet Take 17 g by mouth daily. 14 each 0   potassium chloride (KLOR-CON) 10 MEQ tablet TAKE 2 TABLETS(20 MEQ) BY MOUTH DAILY 180 tablet 3   silver sulfADIAZINE (SILVADENE) 1 % cream Apply 1 application topically daily. 50 g 1   traMADol (ULTRAM) 50 MG tablet TAKE 1 TABLET(50 MG) BY MOUTH TWICE DAILY AS NEEDED 40 tablet 0   traZODone (DESYREL) 50 MG tablet TAKE 1/2 TO 1 TABLET(25 TO 50 MG) BY MOUTH AT BEDTIME 30 tablet 2   Vibegron (GEMTESA) 75 MG TABS Take 75 mg by mouth daily.     vitamin B-12 (CYANOCOBALAMIN) 500 MCG tablet Take 1 tablet (500 mcg total) by mouth 2 (two) times a week.     Zinc 50 MG CAPS Take 1 capsule (50 mg total) by mouth daily.  0   No current facility-administered medications for this visit.   Facility-Administered Medications Ordered in Other Visits  Medication Dose Route Frequency Provider Last Rate Last Admin   ondansetron (ZOFRAN) 4 mg in sodium chloride 0.9 % 50 mL IVPB  4 mg Intravenous Q6H PRN Kristeen Miss, MD        Allergies  Allergen Reactions   Codeine Nausea Only   Sulfa Antibiotics Nausea  Only   Sulfonamide Derivatives Nausea Only     Diagnoses:  Adjustment disorder with mixed anxiety and depressed mood  Plan of Care: Patient is a 76 year old year old female who presented for an initial assessment. Patient reported she was referred for a neuropsychological evaluation due to memory loss. Patient reported as a result of the neuropsychological evaluation it was recommended patient participate in therapy for treatment of anxiety and depression. In addition, patient reported she followed up with her primary care provider and he recommended therapy. Patient reported the following symptoms: recent weight gain, recent increase in appetite, difficulty staying asleep, decreased energy, stated "I stay tired", "fair" mood most days, depressed mood some days, changes in memory, doesn't want to go out to perform tasks, feelings of loneliness, feeling anxious/nervous, and worry some days. Patient reported symptoms started after the death of her husband and son. Patient denied current and past suicidal ideation and homicidal ideation. Patient reported no current tobacco, alcohol, or drug use. Patient reported history of drinking a glass of wine infrequently.  Patient reported history of tobacco use but no use since 1976. Patient reported no history of drug use. Patient reported no history of inpatient or outpatient psychiatric treatment. Patient reported her husband died in a motor vehicle accident and her son died of a massive heart attack within six months of each other. Patient reported the loss of her husband and son is a stressor. Patient reported difficulty recalling aspects surrounding the death of her husband and son. Patient reported she has some friends that are a source of support for patient. It is recommended patient participate in individual therapy. Clinician will review recommendations and treatment plan with patient during follow up appointment.   Katherina Right, LCSW

## 2022-07-29 ENCOUNTER — Ambulatory Visit (INDEPENDENT_AMBULATORY_CARE_PROVIDER_SITE_OTHER): Payer: Medicare Other

## 2022-07-29 DIAGNOSIS — I495 Sick sinus syndrome: Secondary | ICD-10-CM

## 2022-07-29 LAB — CUP PACEART REMOTE DEVICE CHECK
Battery Remaining Longevity: 35 mo
Battery Remaining Percentage: 35 %
Battery Voltage: 2.93 V
Brady Statistic AP VP Percent: 66 %
Brady Statistic AP VS Percent: 2.8 %
Brady Statistic AS VP Percent: 31 %
Brady Statistic AS VS Percent: 1 %
Brady Statistic RA Percent Paced: 68 %
Brady Statistic RV Percent Paced: 97 %
Date Time Interrogation Session: 20231020040014
Implantable Lead Implant Date: 20170907
Implantable Lead Implant Date: 20170907
Implantable Lead Location: 753859
Implantable Lead Location: 753860
Implantable Pulse Generator Implant Date: 20170907
Lead Channel Impedance Value: 390 Ohm
Lead Channel Impedance Value: 540 Ohm
Lead Channel Pacing Threshold Amplitude: 0.25 V
Lead Channel Pacing Threshold Amplitude: 0.625 V
Lead Channel Pacing Threshold Pulse Width: 0.5 ms
Lead Channel Pacing Threshold Pulse Width: 0.5 ms
Lead Channel Sensing Intrinsic Amplitude: 3 mV
Lead Channel Sensing Intrinsic Amplitude: 6.3 mV
Lead Channel Setting Pacing Amplitude: 0.875
Lead Channel Setting Pacing Amplitude: 2 V
Lead Channel Setting Pacing Pulse Width: 0.5 ms
Lead Channel Setting Sensing Sensitivity: 2 mV
Pulse Gen Model: 2272
Pulse Gen Serial Number: 7945283

## 2022-08-04 NOTE — Progress Notes (Signed)
Remote pacemaker transmission.   

## 2022-08-05 ENCOUNTER — Other Ambulatory Visit: Payer: Self-pay | Admitting: Family Medicine

## 2022-08-05 DIAGNOSIS — Z1231 Encounter for screening mammogram for malignant neoplasm of breast: Secondary | ICD-10-CM

## 2022-08-06 ENCOUNTER — Encounter: Payer: Self-pay | Admitting: Family Medicine

## 2022-08-08 ENCOUNTER — Encounter: Payer: Self-pay | Admitting: Family Medicine

## 2022-08-08 ENCOUNTER — Ambulatory Visit (INDEPENDENT_AMBULATORY_CARE_PROVIDER_SITE_OTHER): Payer: Medicare Other | Admitting: Family Medicine

## 2022-08-08 VITALS — BP 110/60 | HR 79 | Temp 98.2°F | Ht 64.5 in | Wt 156.0 lb

## 2022-08-08 DIAGNOSIS — R600 Localized edema: Secondary | ICD-10-CM

## 2022-08-08 NOTE — Telephone Encounter (Signed)
Left message to return call to our office.  Dr. Darnell Level does not have anything open but we do have open appointments in the office today.

## 2022-08-08 NOTE — Patient Instructions (Signed)
Voltaren 1% gel, over the counter ?You can apply up to 4 times a day ? ?This can be applied to any joint: knee, wrist, fingers, elbows, shoulders, feet and ankles. ?Can apply to any tendon: tennis elbow, achilles, tendon, rotator cuff or any other tendon. ? ?Minimal is absorbed in the bloodstream: ok with oral anti-inflammatory or a blood thinner. ? ?Cost is about 9 dollars  ?

## 2022-08-08 NOTE — Progress Notes (Signed)
[name], MD, [place] at [facility] [place], [address]  Phone: [phone]  FAX: [phone]  [name] - 76 y.o. female  MRN [id number]  Date of Birth: 1946/01/07  Date: 08/08/2022  PCP: Ria Bush, MD  Referral: Ria Bush, MD  Chief Complaint  Patient presents with   Leg Swelling    Right Leg Draining   Ankle Pain    Left   Subjective:   [name] is a 76 y.o. very pleasant female patient with Body mass index is 26.36 kg/m. who presents with the following:  R leg swelling, but it has resolved.  For the last 3 days she did have some swelling in the bilateral lower extremities, right greater than left.  She had some weeping from the right lower leg, but that is resolved.  She did put on some socks and a Coban wrap, and that seems to have helped.  She has much less swelling now.  There is no wheezing or bleeding.  She does not have any pain.  She is on Eliquis and has no history of blood clot, on Eliquis for A-fib.  Mild l lateral ankle pain, not tried anything  Review of Systems is noted in the HPI, as appropriate  Objective:   BP 110/60   Pulse 79   Temp 98.2 F (36.8 C) (Oral)   Ht 5' 4.5" (1.638 m)   Wt 156 lb (70.8 kg)   SpO2 95%   BMI 26.36 kg/m   GEN: No acute distress; alert,appropriate. PULM: Breathing comfortably in no respiratory distress PSYCH: Normally interactive.  Trace lower extremity edema bilaterally Right-sided calf is mildly bigger than the other, but there is a negative Homans test.  No signs of open wound.  Lateral malleolus on the left side is mildly tender.  There is no tenderness of the ATFL, CFL, deltoid ligaments or talus.  The remainder of the bony ankle exam and foot exam is normal  Laboratory and Imaging Data:  Assessment and Plan:     ICD-10-CM   1. Lower extremity edema  R60.0      This seems to have resolved on  its own with elevation and some compression.  Did recommend that she get some compression socks, light weight, and I think this would probably help with some lower extremity edema.  Left ankle exam is fairly benign.  I would not do anything else other than take some occasional Tylenol or use Voltaren gel.  Follow-up as needed  Dragon [employer] One speech-to-text software was used for transcription in this dictation.  Possible transcriptional errors can occur using Editor, commissioning.   Signed,  Maud Deed. Mercedes Valeriano, MD   Outpatient Encounter Medications as of 08/08/2022  Medication Sig   acetaminophen (TYLENOL) 500 MG tablet Take 2 tablets (1,000 mg total) by mouth daily.   albuterol (VENTOLIN HFA) 108 (90 Base) MCG/ACT inhaler Inhale 2 puffs into the lungs every 6 (six) hours as needed for wheezing.   apixaban (ELIQUIS) 5 MG TABS tablet Take 1 tablet (5 mg total) by mouth 2 (two) times daily.   Biotin 1000 MCG tablet Take 1,000 mcg by mouth daily.   calcium carbonate (OSCAL) 1500 (600 Ca) MG TABS tablet Take 600 mg of elemental calcium by mouth daily.   carboxymethylcellulose (REFRESH PLUS) 0.5 % SOLN Place 1 drop into both eyes 3 (three) times daily as needed (dry eyes).   cephALEXin (KEFLEX) 250 MG  capsule Take 250 mg by mouth daily.   Cholecalciferol (VITAMIN D) 50 MCG (2000 UT) tablet Take 2,000 Units by mouth daily.   docusate sodium (COLACE) 100 MG capsule Take 200 mg by mouth every evening.   Fluticasone-Umeclidin-Vilant (TRELEGY ELLIPTA) 200-62.5-25 MCG/ACT AEPB INHALE 1 PUFF INTO THE LUNGS DAILY   furosemide (LASIX) 40 MG tablet Take 1 tablet (40 mg total) by mouth daily. May take an extra tablet daily as needed for swelling.   gabapentin (NEURONTIN) 300 MG capsule Take 300 mg by mouth at bedtime.   ipratropium-albuterol (DUONEB) 0.5-2.5 (3) MG/3ML SOLN Take 3 mLs by nebulization every 6 (six) hours as needed.   lovastatin (MEVACOR) 40 MG tablet TAKE 1 TABLET(40 MG) BY MOUTH AT BEDTIME    montelukast (SINGULAIR) 10 MG tablet TAKE 1 TABLET(10 MG) BY MOUTH AT BEDTIME   Multiple Vitamins-Minerals (PRESERVISION AREDS 2 PO) Take 2 capsules by mouth every evening.    nitrofurantoin, macrocrystal-monohydrate, (MACROBID) 100 MG capsule Take 100 mg by mouth 2 (two) times daily.   polyethylene glycol (MIRALAX / GLYCOLAX) packet Take 17 g by mouth daily.   potassium chloride (KLOR-CON) 10 MEQ tablet TAKE 2 TABLETS(20 MEQ) BY MOUTH DAILY   silver sulfADIAZINE (SILVADENE) 1 % cream Apply 1 application topically daily.   traMADol (ULTRAM) 50 MG tablet TAKE 1 TABLET(50 MG) BY MOUTH TWICE DAILY AS NEEDED   traZODone (DESYREL) 50 MG tablet TAKE 1/2 TO 1 TABLET(25 TO 50 MG) BY MOUTH AT BEDTIME   Vibegron (GEMTESA) 75 MG TABS Take 75 mg by mouth daily.   vitamin B-12 (CYANOCOBALAMIN) 500 MCG tablet Take 1 tablet (500 mcg total) by mouth 2 (two) times a week.   Zinc 50 MG CAPS Take 1 capsule (50 mg total) by mouth daily.   Facility-Administered Encounter Medications as of 08/08/2022  Medication   ondansetron (ZOFRAN) 4 mg in sodium chloride 0.9 % 50 mL IVPB

## 2022-08-08 NOTE — Telephone Encounter (Signed)
Plz triage pt.  °

## 2022-08-08 NOTE — Telephone Encounter (Signed)
I spoke with pt; pt said both lower legs hurt but rt leg is weeping; started on 08/06/22.pt said difficult to explain pain in lower legs but pt said is more a burning sensation.Rt lower leg appears puffy. No CP or SOB. No redness noted lower legs.Pt is elevating legs when sitting. Pt already scheduled appt with Dr Darnell Level on 08/09/22 but pt would like to be seen today. Pt scheduled appt with Dr Lorelei Pont 08/08/22 at 12 noon with UC & ED precautions. Pt wants to wait on cancelling appt with Dr Darnell Level on 08/09/22 until sees Dr Lorelei Pont. Sending note to Dr Lorelei Pont.

## 2022-08-09 ENCOUNTER — Ambulatory Visit: Payer: Medicare Other | Admitting: Family Medicine

## 2022-08-11 ENCOUNTER — Other Ambulatory Visit: Payer: Self-pay | Admitting: Cardiovascular Disease

## 2022-08-11 ENCOUNTER — Other Ambulatory Visit: Payer: Self-pay | Admitting: Internal Medicine

## 2022-08-12 NOTE — Telephone Encounter (Signed)
Prescription refill request for Eliquis received. Indication:afib Last office visit:6/23 Scr:0.9 Age: 76 Weight:70.8 kg  Prescription refilled

## 2022-08-17 ENCOUNTER — Ambulatory Visit: Payer: Medicare Other | Admitting: Clinical

## 2022-08-17 NOTE — Progress Notes (Signed)
NEUROLOGY FOLLOW UP OFFICE NOTE  [name] [id number]  Assessment/Plan:   Recurrent vision loss in left eye - unlikely to be vascular or cardioembolic event as it involves the same eye and there are no focal arterial stenoses (such as left ICA).  No history of migraines and without positive visual phenomena, unlikely to represent ocular migraine.  Unlikely to be an ischemic optic neuropathy as symptoms are so brief.  I unfortunately do not have a definitive answer but recommendation is to continue management of stroke risk factors. Subjective memory complaints.  Neuropsychological testing within normal limits and may be related to underlying depression  However, brain imaging does demonstrate evidence of cerebrovascular disease as well as minimal chronic microhemorrhages that are nonspecific but could suggest cerebral amyloid angiopathy.  Therefore I would recommend monitoring for any progression of cognitive problems.     Continue management of depression Monitor for any progression of cognitive problems.  Follow up as needed.     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    UPDATE: MRI of brain on 01/27/2022 personally reviewed showed mild to moderate chronic small vessel ischemic changes with small remote left caudate infarct and mild burden of scattered cortically predominant chronic microhemorrhages, nonspecific.  She had neuropsychological testing on 04/22/2022 that was within normal limits and thought to be contributed by her depression.   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  Since last year she has had recurrent episodes of visual disturbance in the left eye, semiology consistent with amaurosis fugax  Looks like a shade goes down and blacks out vision in left eye.  1 to 2 minutes.  No  flashes of light or positive symptoms in eye.  No headache, unilateral numbness or weakness.  At least 3-4 times in last 6 months.  In the past, events occurred in setting of subtherapeutic INR.  However, that has not always been the case.  Cardiology did not believe these events to be cardioembolic as they are habitual.  She saw outside neurology in August.  CTA of head and neck on 06/18/2021 personally reviewed showed atherosclerotic disease at both carotid artery bifurcations but no hemodynamically significant intracranial and extracranial stenosis.  CT head demonstrated chronic small vessel ischemic changes and remote lacunar infarct at the left caudate head but no acute findings.  She saw her eye doctor and was diagnosed with dry macular degeneration.  Otherwise no abnormalities on exam.  No history of migraines/headaches.   She is also concerned about memory problems over the past year.  Last year, TSH was 2.08 and B12 was 614.  She reports problems remembering events with family from last year.  No recollections.  Even with prompting.  She is independent.  Appointments written down, write notes for herself, drives without getting lost, no problems remembering to take medications or manage finances.  No known family history of dementia.  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 Known DISH with thoracic spine fusion and moderate kyphosis, h/o L spine fusion L2-5   Adjustment disorder with depressed mood 10/15/2014   50 yo  son died MI 09/2014 Husband died car accident 04/05/2015   Anemia    Anticoagulant long-term use 06/14/2019   She takes warfarin for afib/flutter with CHADS2VASc score of 4 (age, sex, CHF, HTN), s/p 2 bioprosthetic valve replacements  Coumadin changed to eliquis 2022 per cardiology   Aortic atherosclerosis 03/03/2022   Noted on 01/2022 chest CT   Arthritis    Arthropathy of spinal facet joint 03/08/2018   Ascending aortic aneurysm 03/02/2021   4.1cm on CT 01/2021 rec yearly monitoring   Bilateral hip pain 09/08/2016   CAP (community acquired pneumonia) 02/08/2016   Carotid stenosis 12/08/2020   Carotid US - 40-59% BICA stenosis (2015) Carotid US 12/2020 - 1-39% BICA stenosis, incidental 3.4cm structure behind L common carotid artery rec further imaging    Cataracts, bilateral    immature   Chest pain 08/25/2014   myoview low risk 02/2018 ([facility]itz)   Chronic cholecystitis with calculus 01/21/2015   S/p cholecystectomy    Chronic diastolic heart failure 09/32/3557   Chronic insomnia    Chronic lower back pain 03/05/2008   Returned to Dr Ellene Route - translaminar ESI at L3/4. Myelogram showed significant left sided foraminal stenosis at L2/3 and L3/4 in setting of solid arthrodesis, planned DEXA and if stable osteopenia, considering anterolateral decompression with spacer.    CKD (chronic kidney disease) stage 2, GFR 60-89 ml/min 07/03/2015   Constipation    takes Miralax daily as needed   COPD (chronic obstructive pulmonary disease)    Albuterol inhaler daily as needed;Duoneb daily as needed;Spiriva daily   DDD (degenerative disc disease)    cervical - kyphosis with mod DD changes C5/6 and C6/7; lumbar - early DD at L2/3 (Elsner)   DISH (diffuse idiopathic skeletal hyperostosis) 05/14/2019   By MRI noted fusion of lower thoracic vertebrae - likely contributing to unsteadiness (Elsner)   Dyspnea on exertion 06/05/2015   myoview low risk 02/2018   Essential hypertension, benign 03/24/2009   GERD (gastroesophageal reflux disease)    takes Protonix daily   Hematuria 06/17/2021   History of radiation therapy 05/30/11 to 07/07/11   rectum   History of rectal cancer 05/26/2011   Rectal cancer 2012, midrectum [id number], s/p neoadj  chemo&XRT, lap LAR with diverting loop ileostomy 08/2011, ileostomy takedown 04-04-2012 Discharged from oncology clinic 2017 Benay Spice)   History of shingles    History of vertebral fracture 11/26/2019   Old L1 vertebral compression fracture incidentally noted on imaging 2020    HLD (hyperlipidemia) 05/03/2007   Hyperlipemia    takes Lovastatin daily   Lacunar infarction    Left caudate   Left hip pain 07/29/2020   Legally blind in left eye    Lung nodule    RLL nodule-20m stable 2006, April 2009, and J26-Jun-2009  Lymphangioma 01/07/2021   Noted incidentally on carotid [place]/2022 CT neck - Likely benign lymphangioma 01/2021   Macrocytosis 03/27/2022   Folate and b12 checked 2023   Malaise and fatigue 01/22/2018   Meralgia paresthetica 12/13/2007   Mixed stress and urge urinary incontinence 03/16/2015   Failed oxybutynin IR/ER. Vesicare initially helped but then no longer helpful.  myrbetriq was not effective.  Saw urology (MacDiarmid) - UDS largely overactive bladder with mild-mod stress incontinence. Improved on abx course - maintained on mChina    Obesity    Osteopenia 12/2014   T -1.5 hip   Other spondylosis with radiculopathy, lumbar region 07/17/2018   Pain of right sacroiliac joint 08/09/2017   Pancreatitis 11/2014   ?  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   Pulmonary infiltrates 04/23/2019   06/13/2018-CT super D chest without contrast- generally stable chronic lung disease with pleural parenchymal scarring and scattered nodularity most of these nodules have been tree-in-bud in appearance likely postinfectious or inflammatory the dominant right lower lobe nodule seen on most recent study has resolved no new or enlarging nodules  04/22/2019-CT chest without contrast- waxing and waning per   Pulmonary nodule 02/23/2012   RLL nodule-77m stable 2006, April 2009, and June 2009 Small pulm nodules Jan 2012 - > Oct 2012 without change   Rheumatic heart disease mitral stenosis    mod MS by echo 02/2014   Right leg swelling 06/11/2020   R venous [place]9/2021 WNL: - No evidence of deep vein thrombosis in the lower extremity. No indirect evidence of obstruction proximal to the inguinal ligament. - No cystic structure found in the popliteal fossa.   S/P aortic and mitral valve bioprostheses 12/08/2013   AVR 21 mm, MVR 25 mm-MagnaEase pericardia   S/P AVR (aortic valve replacement) 2015   bioprosthetic (Bartle)   S/P MVR (mitral valve replacement) 2015   bioprosthetic (Bartle)   Sepsis secondary to UTI (HHallam 08/22/2017   Severe aortic valve stenosis 10/01/2013   mild-mod by echo 02/2014   Skin lesion 01/03/2022   Small bowel obstruction, partial 08/2013   reolved without NGT placement.    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     albuterol (VENTOLIN  HFA) 108 (90 Base) MCG/ACT inhaler Inhale 2 puffs into the lungs every 6 (six) hours as needed for wheezing. 18 g 6   apixaban (ELIQUIS) 5 MG TABS tablet TAKE 1 TABLET(5 MG) BY MOUTH TWICE DAILY 60 tablet 5   Biotin 1000 MCG tablet Take 1,000 mcg by mouth daily.     calcium carbonate (OSCAL) 1500 (600 Ca) MG TABS tablet Take 600 mg of elemental calcium by mouth daily.     carboxymethylcellulose (REFRESH PLUS) 0.5 % SOLN Place 1 drop into both eyes 3 (three) times daily as needed (dry eyes).     cephALEXin (KEFLEX) 250 MG capsule Take 250 mg by mouth daily.     Cholecalciferol (VITAMIN D) 50 MCG (2000 UT) tablet Take 2,000 Units by mouth daily.     docusate sodium (COLACE) 100 MG capsule Take 200 mg by mouth every evening.     Fluticasone-Umeclidin-Vilant (TRELEGY ELLIPTA) 200-62.5-25 MCG/ACT AEPB INHALE 1  PUFF INTO THE LUNGS DAILY 60 each 11   furosemide (LASIX) 40 MG tablet Take 1 tablet (40 mg total) by mouth daily. May take an extra tablet daily as needed for swelling. 135 tablet 2   gabapentin (NEURONTIN) 300 MG capsule Take 300 mg by mouth at bedtime.     ipratropium-albuterol (DUONEB) 0.5-2.5 (3) MG/3ML SOLN Take 3 mLs by nebulization every 6 (six) hours as needed. 360 mL 0   lovastatin (MEVACOR) 40 MG tablet TAKE 1 TABLET(40 MG) BY MOUTH AT BEDTIME 90 tablet 1   montelukast (SINGULAIR) 10 MG tablet TAKE 1 TABLET(10 MG) BY MOUTH AT BEDTIME 90 tablet 1   Multiple Vitamins-Minerals (PRESERVISION AREDS 2 PO) Take 2 capsules by mouth every evening.      nitrofurantoin, macrocrystal-monohydrate, (MACROBID) 100 MG capsule Take 100 mg by mouth 2 (two) times daily.     polyethylene glycol (MIRALAX / GLYCOLAX) packet Take 17 g by mouth daily. 14 each 0   potassium chloride (KLOR-CON) 10 MEQ tablet TAKE 2 TABLETS(20 MEQ) BY MOUTH DAILY 180 tablet 3   silver sulfADIAZINE (SILVADENE) 1 % cream Apply 1 application topically daily. 50 g 1   traMADol (ULTRAM) 50 MG tablet TAKE 1 TABLET(50 MG) BY MOUTH  TWICE DAILY AS NEEDED 40 tablet 0   traZODone (DESYREL) 50 MG tablet TAKE 1/2 TO 1 TABLET(25 TO 50 MG) BY MOUTH AT BEDTIME 30 tablet 2   Vibegron (GEMTESA) 75 MG TABS Take 75 mg by mouth daily.     vitamin B-12 (CYANOCOBALAMIN) 500 MCG tablet Take 1 tablet (500 mcg total) by mouth 2 (two) times a week.     Zinc 50 MG CAPS Take 1 capsule (50 mg total) by mouth daily.  0   Current Facility-Administered Medications on File Prior to Visit  Medication Dose Route Frequency Provider Last Rate Last Admin   ondansetron (ZOFRAN) 4 mg in sodium chloride 0.9 % 50 mL IVPB  4 mg Intravenous Q6H PRN Kristeen Miss, MD        ALLERGIES: Allergies  Allergen Reactions   Codeine Nausea Only   Sulfa Antibiotics Nausea Only   Sulfonamide Derivatives Nausea Only    FAMILY HISTORY: Family History  Problem Relation Age of Onset   Heart disease Mother    Diabetes Mother    Hypertension Mother    Stroke Mother    Lung cancer Father    Breast cancer Maternal Aunt 83   Heart attack Maternal Grandfather    CAD Son 52   Heart attack Son 28   Rectal cancer Neg Hx    Stomach cancer Neg Hx    Esophageal cancer Neg Hx    Colon cancer Neg Hx       Objective:  Blood pressure 128/71, pulse 68, resp. rate 18, height 5' 4.5" (1.638 m), weight 152 lb (68.9 kg), SpO2 100 %. General: No acute distress.  Patient appears well-groomed.   Head:  Normocephalic/atraumatic Eyes:  Fundi examined but not visualized Neck: supple, no paraspinal tenderness, full range of motion Heart:  Regular rate and rhythm Neurological Exam: alert and oriented to person, place, and time.  Speech fluent and not dysarthric, language intact.  CN II-XII intact. Bulk and tone normal, muscle strength 5/5 throughout.  Sensation to light touch intact.  Deep tendon reflexes 2+ throughout, toes downgoing.  Finger to nose testing intact.  Gait steady. Romberg negative.   Metta Clines, DO  CC: Ria Bush, MD

## 2022-08-19 ENCOUNTER — Encounter: Payer: Self-pay | Admitting: Neurology

## 2022-08-19 ENCOUNTER — Ambulatory Visit (INDEPENDENT_AMBULATORY_CARE_PROVIDER_SITE_OTHER): Payer: Medicare Other | Admitting: Neurology

## 2022-08-19 VITALS — BP 128/71 | HR 68 | Resp 18 | Ht 64.5 in | Wt 152.0 lb

## 2022-08-19 DIAGNOSIS — H547 Unspecified visual loss: Secondary | ICD-10-CM | POA: Diagnosis not present

## 2022-08-19 DIAGNOSIS — R4189 Other symptoms and signs involving cognitive functions and awareness: Secondary | ICD-10-CM

## 2022-08-19 DIAGNOSIS — I679 Cerebrovascular disease, unspecified: Secondary | ICD-10-CM

## 2022-08-19 NOTE — Patient Instructions (Signed)
If you feel you are having worsening of memory, follow up Continue management of depression Follow up as needed

## 2022-08-20 ENCOUNTER — Other Ambulatory Visit: Payer: Self-pay | Admitting: Family Medicine

## 2022-08-22 NOTE — Telephone Encounter (Signed)
Refill request  Last office visit 08/08/22 Dr. Lorelei Pont Tramadol LR 07/18/22 #40 Trazodone LR 05/23/22 #30/2

## 2022-08-23 NOTE — Telephone Encounter (Signed)
ERx 

## 2022-08-31 ENCOUNTER — Ambulatory Visit: Payer: Medicare Other | Admitting: Neurology

## 2022-09-09 ENCOUNTER — Other Ambulatory Visit: Payer: Self-pay | Admitting: Internal Medicine

## 2022-09-12 ENCOUNTER — Encounter: Payer: Self-pay | Admitting: Cardiovascular Disease

## 2022-09-12 ENCOUNTER — Ambulatory Visit: Payer: Medicare Other | Attending: Cardiovascular Disease | Admitting: Cardiovascular Disease

## 2022-09-12 VITALS — BP 138/71 | HR 81 | Ht 62.0 in | Wt 148.8 lb

## 2022-09-12 DIAGNOSIS — E7800 Pure hypercholesterolemia, unspecified: Secondary | ICD-10-CM

## 2022-09-12 DIAGNOSIS — Z953 Presence of xenogenic heart valve: Secondary | ICD-10-CM

## 2022-09-12 DIAGNOSIS — I5032 Chronic diastolic (congestive) heart failure: Secondary | ICD-10-CM | POA: Diagnosis not present

## 2022-09-12 DIAGNOSIS — Z950 Presence of cardiac pacemaker: Secondary | ICD-10-CM

## 2022-09-12 DIAGNOSIS — I7121 Aneurysm of the ascending aorta, without rupture: Secondary | ICD-10-CM

## 2022-09-12 DIAGNOSIS — T82857D Stenosis of cardiac prosthetic devices, implants and grafts, subsequent encounter: Secondary | ICD-10-CM

## 2022-09-12 DIAGNOSIS — I442 Atrioventricular block, complete: Secondary | ICD-10-CM

## 2022-09-12 DIAGNOSIS — I480 Paroxysmal atrial fibrillation: Secondary | ICD-10-CM | POA: Diagnosis not present

## 2022-09-12 DIAGNOSIS — Z87898 Personal history of other specified conditions: Secondary | ICD-10-CM

## 2022-09-12 DIAGNOSIS — D6869 Other thrombophilia: Secondary | ICD-10-CM

## 2022-09-12 DIAGNOSIS — I495 Sick sinus syndrome: Secondary | ICD-10-CM

## 2022-09-12 NOTE — Patient Instructions (Signed)
Medication Instructions:  Continue same medications *If you need a refill on your cardiac medications before your next appointment, please call your pharmacy*   Lab Work: None ordered   Testing/Procedures: None ordered   Follow-Up: At [facility], you and your health needs are our priority.  As part of our continuing mission to provide you with exceptional heart care, we have created designated Provider Care Teams.  These Care Teams include your primary Cardiologist (physician) and Advanced Practice Providers (APPs -  Physician Assistants and Nurse Practitioners) who all work together to provide you with the care you need, when you need it.  We recommend signing up for the patient portal called "MyChart".  Sign up information is provided on this After Visit Summary.  MyChart is used to connect with patients for Virtual Visits (Telemedicine).  Patients are able to view lab/test results, encounter notes, upcoming appointments, etc.  Non-urgent messages can be sent to your provider as well.   To learn more about what you can do with MyChart, go to [email or web address].    Your next appointment:  6 months   Call in March to schedule June appointment     The format for your next appointment: Office   Provider: Dr.Croitoru   Important Information About Sugar

## 2022-09-12 NOTE — Progress Notes (Signed)
Cardiology office note   Date:  09/17/2022   ID:  [name], [place] 04-07-1946, MRN [id number]  Patient Location: Home Provider Location: Home  PCP:  Ria Bush, MD  Cardiologist:  Sanda Klein, MD  Electrophysiologist:  None   Evaluation Performed:  Follow-Up Visit  Chief Complaint: CHF, AFib  History of Present Illness:    [name] is a 76 y.o. 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  From a cardiovascular point of view she does not have any specific complaints.  She feels tired all day long and this is because of very poor quality sleep, very frequently interrupted.  She is often very anxious.  She feels agitated.  Her recent neuropsychiatric screening studies suggested that she suffers from anxiety and depression.  She denies exertional angina or dyspnea and does not have orthopnea, PND or lower extremity edema.  She has done a great job keeping extra weight off and her BMI is 27.  She does not have palpitations and denies syncope.  She has not had claudication or new focal neurological complaints.  She feels that her memory is deteriorating.  Continues to live independently.  Pacemaker interrogation shows normal device function.  Presenting rhythm is atrial sensed (sinus) with ventricular paced beats and occasional PVCs.  Her underlying rhythm is idioventricular escape at about 45 bpm.  She has 61% atrial pacing and 97% ventricular pacing with an overall burden of atrial fibrillation that is well  under 1%.  She had a 4-hour episode of atrial fibrillation on November 5.  She has frequent brief bursts of paroxysmal atrial tachycardia.  Lead parameters are normal.      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  She had a lot of problems with easy bleeding and bruising on warfarin, as well as highly erratic INR, rarely kept in normal range.  In February 2022 we switched from warfarin to Eliquis.  She has had repeated episodes of transient partial visual field cut in her left eye, lasting for a few minutes at a time.  Usually its the bottom half of her visual field that she loses.  Initial concern that this may be related to thromboembolic events is felt to likely anymore, since the episodes are repetitive and stereotypical always in the same distribution.  Her previous ophthalmological evaluation also "did not show anything wrong".  She remains frustrated with her memory problems.  She has a follow-up with the neuropsychologist on July 14.  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   She had a CT myelogram on May 08 2019.  There was severe left foraminal stenosis at L3-4 as well as moderate left foraminal stenosis at C4-5-6.  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   She had her most recent echocardiogram 10/13/2021.  This continues to show normal left ventricular systolic function and stable aortic valve prosthesis parameters.  The mean mitral gradient is slightly higher at 9.7 mmHg (up from 7 mmHg), but the mitral valve deceleration time is actually little lower  at 338 ms (previously 392 ms).  This suggests that the increased gradients may be due to a change in cardiac output/stroke-volume, rather than a true deterioration in mitral valve performance.  She has a very mild ascending aortic aneurysm that has been stable in size, most recently 4.2 cm on 08/16/2021.  She does have scattered aortic  atherosclerosis.  On this last scan there appeared to be improvement in the areas of bronchiectasis/groundglass opacities represent MAC infection.    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 Known DISH with thoracic spine fusion and moderate kyphosis, h/o L spine fusion L2-5   Adjustment disorder with depressed mood 11-07-2014   9 yo son died MI October 11, 2014 Husband died car accident 04-12-2015   Anemia    Anticoagulant long-term use 06/14/2019   She takes warfarin for afib/flutter with CHADS2VASc score of 4 (age, sex, CHF, HTN), s/p 2 bioprosthetic valve replacements Coumadin changed to eliquis 2022 per cardiology   Aortic atherosclerosis 03/03/2022   Noted on 01/2022 chest CT   Arthritis    Arthropathy of spinal facet joint 03/08/2018   Ascending aortic aneurysm 03/02/2021   4.1cm on CT 01/2021 rec yearly monitoring   Bilateral hip pain 09/08/2016   CAP (community acquired pneumonia) 02/08/2016   Carotid stenosis 12/08/2020   Carotid US - 40-59% BICA stenosis (2015) Carotid US 12/2020 - 1-39% BICA stenosis, incidental 3.4cm structure behind L common carotid artery rec further imaging    Cataracts, bilateral    immature   Chest pain 08/25/2014   myoview low risk 02/2018 (Camitz)   Chronic cholecystitis with calculus 01/21/2015   S/p cholecystectomy    Chronic diastolic heart failure 38/75/6433   Chronic insomnia    Chronic lower back pain 03/05/2008   Returned to Dr Ellene Route - translaminar ESI at L3/4. Myelogram showed significant left sided foraminal stenosis at L2/3 and L3/4 in setting of solid arthrodesis, planned DEXA and if stable osteopenia, considering anterolateral decompression with spacer.    CKD (chronic kidney disease) stage 2, GFR 60-89 ml/min 07/03/2015    Constipation    takes Miralax daily as needed   COPD (chronic obstructive pulmonary disease)    Albuterol inhaler daily as needed;Duoneb daily as needed;Spiriva daily   DDD (degenerative disc disease)    cervical - kyphosis with mod DD changes C5/6 and C6/7; lumbar - early DD at L2/3 ([facility]ner)   DISH (diffuse idiopathic skeletal hyperostosis) 05/14/2019   By MRI noted fusion of lower thoracic vertebrae - likely contributing to unsteadiness ([facility]lsner)   Dyspnea on exertion 06/05/2015   myoview low risk 02/2018   Essential hypertension, benign 03/24/2009   GERD (gastroesophageal reflux disease)    takes Protonix daily   Hematuria 06/17/2021   History of radiation therapy 05/30/11 to 07/07/11   rectum   History of rectal cancer 05/26/2011   Rectal cancer 2012, midrectum [id number], s/p neoadj chemo&XRT, lap LAR with diverting loop ileostomy 08/2011, ileostomy takedown April 11, 2012 Discharged from oncology clinic 2017 Benay Spice)   History of shingles    History of vertebral fracture 11/26/2019   Old L1 vertebral compression fracture incidentally noted on imaging 2020    HLD (hyperlipidemia) 05/03/2007   Hyperlipemia    takes Lovastatin daily   Lacunar infarction    Left caudate   Left hip pain 07/29/2020  Legally blind in left eye    Lung nodule    RLL nodule-16m stable 2006, April 2009, and June 2009   Lymphangioma 01/07/2021   Noted incidentally on carotid [place]2022 CT neck - Likely benign lymphangioma 01/2021   Macrocytosis 03/27/2022   Folate and b12 checked 2023   Malaise and fatigue 01/22/2018   Meralgia paresthetica 12/13/2007   Mixed stress and urge urinary incontinence 03/16/2015   Failed oxybutynin IR/ER. Vesicare initially helped but then no longer helpful.  myrbetriq was not effective.  Saw urology (MacDiarmid) - UDS largely overactive bladder with mild-mod stress incontinence. 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   Pulmonary infiltrates 04/23/2019   06/13/2018-CT super D chest without contrast- generally stable chronic lung disease with pleural parenchymal scarring and scattered nodularity most of these nodules have been tree-in-bud in appearance likely postinfectious or inflammatory the dominant right lower lobe nodule seen on most recent study has resolved no new or enlarging nodules  04/22/2019-CT chest without contrast- waxing and waning per   Pulmonary nodule 02/23/2012   RLL nodule-634mstable 2006, April 2009, and June 2009 Small pulm nodules Jan 2012 - > Oct 2012 without change   Rheumatic heart disease mitral stenosis    mod MS by echo 02/2014   Right leg swelling 06/11/2020   R venous [place]/2021 WNL: - No evidence of deep vein thrombosis in the lower extremity. No indirect evidence of obstruction proximal to the inguinal ligament. - No cystic structure found in the popliteal fossa.   S/P aortic and mitral valve bioprostheses 12/08/2013   AVR 21 mm, MVR 25 mm-MagnaEase pericardia   S/P AVR (aortic valve replacement)  2015   bioprosthetic (Bartle)   S/P MVR (mitral valve replacement) 2015   bioprosthetic (Bartle)   Sepsis secondary to UTI [place]/13/2018   Severe aortic valve stenosis 10/01/2013   mild-mod by echo 02/2014   Skin lesion 01/03/2022   Small bowel obstruction, partial 08/2013   reolved without NGT placement.    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   Dr Amy JoMartinique Syncope 05/28/2018   Thrombocytopenia 11/04/2019   Typical atrial flutter 05/15/2019   Vitamin B12 deficiency 02/28/2015   Start B12 shots 02/2015    Vitamin D deficiency 10/23/2016   Past Surgical History:  Procedure Laterality Date   ANTERIOR LAT LUMBAR FUSION Left 07/17/2018   Procedure: Lumbar Two-Three Lumbar Three-Four Anterolateral decompression/interbody fusion with lateral plate fixation/Infuse;  Surgeon: ElKristeen MissMD;  [facility] Service: Neurosurgery;  Laterality: Left;  Lumbar Two-Three Lumbar Three-Four  Anterolateral decompression/interbody fusion with lateral plate fixation/Infuse   AORTIC VALVE REPLACEMENT N/A 12/12/2013   Procedure: AORTIC VALVE REPLACEMENT (AVR);  Surgeon: Gaye Pollack, MD; Service: Open Heart Surgery   BACK SURGERY  2006, 2007   2006 SPACER, 2007 decompression and fusion L4/5   BOWEL RESECTION  04/02/2012   Procedure: SMALL BOWEL RESECTION;  Surgeon: Stark Klein, MD;  Location: [facility];  Service: General;  Laterality: N/A;   BRONCHIAL WASHINGS  03/24/2021   Procedure: BRONCHIAL WASHINGS;  Surgeon: Brand Males, MD;  Location: [facility];  Service: Endoscopy;;   CARPAL TUNNEL RELEASE Bilateral    CATARACT EXTRACTION W/ INTRAOCULAR LENS IMPLANT Left 10/18/2016   CATARACT EXTRACTION W/ INTRAOCULAR LENS IMPLANT Right 12/13/2016   Dr. Kathrin Penner   CHOLECYSTECTOMY N/A 01/21/2015   chronic cholecystitis, Stark Klein, MD   COLON RESECTION  08/25/2011   Procedure: COLON RESECTION LAPAROSCOPIC;  Surgeon: Stark Klein, MD;  Location: [facility];  Service:  General;  Laterality: N/A;  Laparoscopic Assisted Low Anterior Resection Diverting Ostomy and onQ pain pump   COLONOSCOPY  03/2013   1 polyp, rpt 3 yrs Ardis Hughs)   COLONOSCOPY  05/2021   diverticulosis and patent colon anastomosis with some edema/narrowing, rpt 5 yrs Ardis Hughs)   COLONOSCOPY WITH PROPOFOL N/A 06/09/2016   patent colo-colonic anastomosis, rpt 5 yrs Ardis Hughs)   EP IMPLANTABLE DEVICE N/A 06/16/2016   Procedure: Pacemaker Implant;  Surgeon: Will Meredith Leeds, MD;  Location: [facility];  Service: Cardiovascular;  Laterality: N/A;   FOOT SURGERY  left foot   hammer toe   ILEOSTOMY  08/25/2011   ILEOSTOMY CLOSURE  04/02/2012   Procedure: ILEOSTOMY TAKEDOWN;  Surgeon: Stark Klein, MD;  Location: [facility];  Service: General;  Laterality: N/A;   INTRAOPERATIVE TRANSESOPHAGEAL ECHOCARDIOGRAM N/A 12/12/2013   Procedure: INTRAOPERATIVE TRANSESOPHAGEAL ECHOCARDIOGRAM;  Surgeon: Gaye Pollack, MD;  Location: [facility];  Service: Open Heart Surgery;  Laterality: N/A;   LEFT AND RIGHT HEART CATHETERIZATION WITH CORONARY ANGIOGRAM N/A 11/27/2013   Procedure: LEFT AND RIGHT HEART CATHETERIZATION WITH CORONARY ANGIOGRAM;  Surgeon: Sanda Klein, MD;  Location: [facility];  Service: Cardiovascular;  Laterality: N/A;   MITRAL VALVE REPLACEMENT N/A 12/12/2013   Procedure: MITRAL VALVE (MV) REPLACEMENT OR REPAIR;  Surgeon: Gaye Pollack, MD; Service: Open Heart Surgery   TEE WITHOUT CARDIOVERSION N/A 11/29/2013   Procedure: TRANSESOPHAGEAL ECHOCARDIOGRAM (TEE);  Surgeon: Sueanne Margarita, MD;  [facility];  Service: Cardiovascular;  Laterality: N/A;   TEE WITHOUT CARDIOVERSION N/A 06/04/2019   Procedure: TRANSESOPHAGEAL ECHOCARDIOGRAM (TEE);  Surgeon: Sanda Klein, MD;  Location: [facility];  Service: Cardiovascular;  Laterality: N/A;   TONSILLECTOMY     VIDEO BRONCHOSCOPY N/A 03/24/2021   Procedure: VIDEO BRONCHOSCOPY WITHOUT FLUORO;  Surgeon: Brand Males, MD;  Location:  [facility];  Service: Endoscopy;  Laterality: N/A;  SMALL SCOPE,PREMEDICATE WITH NEBULIZED LIDOCAINE     Current Meds  Medication Sig   acetaminophen (TYLENOL) 500 MG tablet Take 2 tablets (1,000 mg total) by mouth daily.   albuterol (VENTOLIN HFA) 108 (90 Base) MCG/ACT inhaler Inhale 2 puffs into the lungs every 6 (six) hours as needed for wheezing.   apixaban (ELIQUIS) 5 MG TABS tablet TAKE 1 TABLET(5 MG) BY MOUTH TWICE DAILY   Biotin 1000 MCG tablet Take 1,000 mcg by mouth daily.   calcium carbonate (OSCAL) 1500 (600 Ca) MG TABS tablet Take 600 mg of elemental calcium by mouth daily.   carboxymethylcellulose (REFRESH PLUS) 0.5 % SOLN Place 1  drop into both eyes 3 (three) times daily as needed (dry eyes).   cephALEXin (KEFLEX) 250 MG capsule Take 250 mg by mouth daily.   Cholecalciferol (VITAMIN D) 50 MCG (2000 UT) tablet Take 2,000 Units by mouth daily.   docusate sodium (COLACE) 100 MG capsule Take 200 mg by mouth every evening.   furosemide (LASIX) 40 MG tablet Take 1 tablet (40 mg total) by mouth daily. May take an extra tablet daily as needed for swelling.   gabapentin (NEURONTIN) 300 MG capsule Take 300 mg by mouth at bedtime.   ipratropium-albuterol (DUONEB) 0.5-2.5 (3) MG/3ML SOLN Take 3 mLs by nebulization every 6 (six) hours as needed.   lovastatin (MEVACOR) 40 MG tablet TAKE 1 TABLET(40 MG) BY MOUTH AT BEDTIME   montelukast (SINGULAIR) 10 MG tablet TAKE 1 TABLET(10 MG) BY MOUTH AT BEDTIME   Multiple Vitamins-Minerals (PRESERVISION AREDS 2 PO) Take 2 capsules by mouth every evening.    nitrofurantoin, macrocrystal-monohydrate, (MACROBID) 100 MG capsule Take 100 mg by mouth 2 (two) times daily.   polyethylene glycol (MIRALAX / GLYCOLAX) packet Take 17 g by mouth daily.   potassium chloride (KLOR-CON) 10 MEQ tablet TAKE 2 TABLETS(20 MEQ) BY MOUTH DAILY   silver sulfADIAZINE (SILVADENE) 1 % cream Apply 1 application topically daily.   traMADol (ULTRAM) 50 MG tablet TAKE 1 TABLET(50  MG) BY MOUTH TWICE DAILY AS NEEDED   traZODone (DESYREL) 50 MG tablet TAKE 1/2 TO 1 TABLET(25 TO 50 MG) BY MOUTH AT BEDTIME   TRELEGY ELLIPTA 200-62.5-25 MCG/ACT AEPB INHALE 1 PUFF INTO THE LUNGS DAILY   Vibegron (GEMTESA) 75 MG TABS Take 75 mg by mouth daily.   vitamin B-12 (CYANOCOBALAMIN) 500 MCG tablet Take 1 tablet (500 mcg total) by mouth 2 (two) times a week.   Zinc 50 MG CAPS Take 1 capsule (50 mg total) by mouth daily.     Allergies:   Codeine, Sulfa antibiotics, and Sulfonamide derivatives   Social History   Tobacco Use   Smoking status: Former    Packs/day: 1.50    Years: 10.00    Total pack years: 15.00    Types: Cigarettes    Quit date: 12/28/1974    Years since quitting: 47.7   Smokeless tobacco: Never   Tobacco comments:    quit smoking in 1976  Vaping Use   Vaping Use: Never used  Substance Use Topics   Alcohol use: Not Currently    Alcohol/week: 2.0 standard drinks of alcohol    Types: 2 Glasses of wine per week    Comment: very rare   Drug use: No     Family Hx: The patient's family history includes Breast cancer (age of onset: 54) in her maternal aunt; CAD (age of onset: 37) in her son; Diabetes in her mother; Heart attack in her maternal grandfather; Heart attack (age of onset: 15) in her son; Heart disease in her mother; Hypertension in her mother; Lung cancer in her father; Stroke in her mother. There is no history of Rectal cancer, Stomach cancer, Esophageal cancer, or Colon cancer.  ROS:   Please see the history of present illness.     All other systems reviewed and are negative.   Prior CV studies:   The following studies were reviewed today:  Echocardiogram 03/26/2021  1. S/P AVR with mean gradient of 14 mmHg and trace AI; S/P MVR with mean  gradient of 7 mmHg.   2. Left ventricular ejection fraction, by estimation, is 60 to 65%. The  left ventricle has normal function. The left ventricle has no regional  wall motion abnormalities. There is  mild left ventricular hypertrophy.  Left ventricular diastolic parameters  are indeterminate. Elevated left atrial pressure.   3. Right ventricular systolic function is normal. The right ventricular  size is mildly enlarged. There is mildly elevated pulmonary artery  systolic pressure.   4. Left atrial size was severely dilated.   5. The mitral valve has been repaired/replaced. No evidence of mitral  valve regurgitation. Mild mitral stenosis. There is a bioprosthetic valve  present in the mitral position.   6. The aortic valve has been repaired/replaced. Aortic valve  regurgitation is trivial. No aortic stenosis is present. There is a  bioprosthetic valve present in the aortic position.   7. Aortic dilatation noted. There is mild dilatation of the aortic root,  measuring 43 mm. There is mild dilatation of the ascending aorta,  measuring 43 mm.   8. The inferior vena cava is normal in size with greater than 50%  respiratory variability, suggesting right atrial pressure of 3 mmHg.  MV Peak grad:  17.1 mmHg  MV Mean grad:  7.0 mmHg      MV Vmax:       2.07 m/s      MV Decel Time: 392 msec    Echocardiogram 10/13/2021  1. Left ventricular ejection fraction, by estimation, is 60 to 65%. The  left ventricle has normal function. The left ventricle has no regional  wall motion abnormalities.   2. Right ventricular systolic function is normal. The right ventricular  size is mildly enlarged.   3. Left atrial size was mildly dilated.   4. The mitral valve has been repaired/replaced. No evidence of mitral  valve regurgitation. No evidence of mitral stenosis. The mean mitral valve  gradient is 9.7 mmHg. There is a 25 mm bioprosthetic valve present in the  mitral position.   5. The aortic valve has been repaired/replaced. Aortic valve  regurgitation is not visualized. No aortic stenosis is present. There is a  bioprosthetic valve present in the aortic position. Echo findings are  consistent  with normal structure and function  of the aortic valve prosthesis. Aortic valve mean gradient measures 12.6  mmHg. Aortic valve Vmax measures 2.28 m/s.   6. Aortic dilatation noted. There is mild dilatation of the ascending  aorta, measuring 43 mm.   7. The inferior vena cava is normal in size with greater than 50%  respiratory variability, suggesting right atrial pressure of 3 mmHg.   Comparison(s): Prior images reviewed side by side. Prior MV mean gradient  7 mmHg.  MV Area (PHT): 2.24 cm      MV Peak grad:  16.2 mmHg     MV Mean grad:  9.7 mmHg  MV Vmax:       2.01 m/s      MV Vmean:      150.0 cm/s    MV Decel Time: 338 msec  TR Vmax:        268.00 cm/s     Labs/Other Tests and Data Reviewed:   CT angiogram of head and neck 06/18/2021 No evidence of acute brain infarction. Chronic small-vessel ischemic change of the white matter. Old lacunar infarction left caudate head. No intracranial large vessel occlusion or correctable proximal stenosis. Aortic atherosclerosis. Atherosclerotic disease at both carotid artery bifurcations. 30-40% stenosis of the proximal ICA on the right. 30% stenosis of the proximal ICA on the left.  EKG: ECG shows atrial  sensed, ventricular paced rhythm with occasional PVCs, long AV delay 2024 ms, QTc 569 ms.  Positive R wave in lead V1  On my review of her chest CTs it's highly suspicious that the tip of the right ventricular pacing lead is in the pericardial space.  This would explain the positive R waves in leads V1.  Recent Labs: 01/03/2022: TSH 1.18 06/22/2022: ALT 15; BUN 17; Creatinine, Ser 0.94; Hemoglobin 14.0; Platelets 96.0; Potassium 3.7; Sodium 143   Recent Lipid Panel Lab Results  Component Value Date/Time   CHOL 154 06/22/2022 08:04 AM   TRIG 88.0 06/22/2022 08:04 AM   HDL 61.70 06/22/2022 08:04 AM   CHOLHDL 2 06/22/2022 08:04 AM   LDLCALC 75 06/22/2022 08:04 AM   LDLDIRECT 111.0 06/26/2015 09:28 AM    Wt Readings from Last 3  Encounters:  09/12/22 67.5 kg  08/19/22 68.9 kg  08/08/22 70.8 kg     Objective:    Vital Signs:  BP 138/71   Pulse 81   Ht _0  (1.575 m)   Wt 67.5 kg   SpO2 90%   BMI 27.22 kg/m      General: Alert, oriented x3, no distress, healthy subclavian pacemaker site Head: no evidence of trauma, PERRL, EOMI, no exophtalmos or lid lag, no myxedema, no xanthelasma; normal ears, nose and oropharynx Neck: normal jugular venous pulsations and no hepatojugular reflux; brisk carotid pulses without delay and no carotid bruits Chest: clear to auscultation, no signs of consolidation by percussion or palpation, normal fremitus, symmetrical and full respiratory excursions Cardiovascular: normal position and quality of the apical impulse, regular rhythm, normal first and second heart sounds,/6 aortic ejection murmur, musical in quality; no diastolic murmurs, rubs or gallops Abdomen: no tenderness or distention, no masses by palpation, no abnormal pulsatility or arterial bruits, normal bowel sounds, no hepatosplenomegaly Extremities: no clubbing, cyanosis or edema; 2+ radial, ulnar and brachial pulses bilaterally; 2+ right femoral, posterior tibial and dorsalis pedis pulses; 2+ left femoral, posterior tibial and dorsalis pedis pulses; no subclavian or femoral bruits Neurological: grossly nonfocal Psych: Normal mood and affect   ASSESSMENT & PLAN:    1. Chronic diastolic heart failure (HCC)   2. Stenosis of prosthetic mitral valve, subsequent encounter   3. History of aortic valve replacement with bioprosthetic valve   4. CHB (complete heart block) (HCC)   5. SSS (sick sinus syndrome) (HCC)   6. Pacemaker   7. Paroxysmal atrial fibrillation (HCC)   8. Acquired thrombophilia ([place])   9. History of syncope   10. Aneurysm of ascending aorta without rupture ([place])   11. Hypercholesterolemia         CHF: Well compensated, asymptomatic, NYHA functional class I, euvolemic on a low-dose of loop  diuretic.   Previously had decompensation due to mitral bioprosthetic valve thrombosis and increased mitral stenosis. Prosthetic MV stenosis: Transient, related to thrombotic leaflet restriction.  Mean mitral valve gradients have been stable at around 7-10  mmHg, most recently evaluated in January 2023.  We switched  from warfarin back to Eliquis due to problems with maintaining INR in therapeutic range and easy bleeding problems when INR was high.  There is really no change in her mitral valve gradients on the most recent echocardiogram.  Repeat echo scheduled for 10/06/2022. S/p AVR: Normal function of the aortic valve biological prosthesis on recent echo.  Stable gradients.  Aware of the need for endocarditis prophylaxis. 3rd deg AV block: She has progressed to complete heart block.  Has an  idioventricular escape rhythm at about 45 bpm.  Should be considered pacemaker dependent. SSS-tachy/brady sd: The heart rate histogram distribution appears less blunted on current download.   Pacemaker: Normal device function virtually 100% ventricular paced.  Keep current sensor settings.    Atrial fibrillation: She was in atrial fibrillation essentially 100% of 2021, but there is been a remarkable reduction in atrial fibrillation and in the last 12 months the burden of atrial fibrillation has been only 1% Anticoagulation: Despite the presence of biological aortic and mitral valve prostheses, we have switched from warfarin to Eliquis due to difficulty with maintaining INR levels in therapeutic range and bleeding complications.  No bleeding complications. History of syncope: History of syncope on higher doses of bisoprolol due to orthostatic hypotension.  No recurrence on metoprolol. COPD: No recent episodes of exacerbation.  Denies wheezing.  Continue Trelegy. 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  Improving on most recent scan from November 2022.  Last saw Dr. Jamey Reas  on 02/14/2022 Recurrent vision loss: Evaluated by neurology and retina specialist.  Has not occurred recently.  Since the episodes are repetitive, this makes cardioembolic events virtually impossible.   Ascending aortic aneurysm: Very mild, essentially stable in size on serial imaging studies.  Although not mentioned in the radiology report from 05/13/2022, on my measurement it is completely unchanged.  Plan to reevaluate in late 2024.  HLP: Does not have known CAD/PAD, but does have some aortic atherosclerosis.  Sept 13 2023 lipid parameters are all acceptable.  DISH: spinal injection did not help.  Limits her activity.  Evaluated by neurology as well who recommended using a cane or walker. Memory problems: This seems to be more an issue with attention and focus, rather than dementia.  Anxiety and depression probably playing a role.  Did well on MMSE (score 30).  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  Note is made of atherosclerotic calcification of the intracranial arteries at the base of the brain. ASCVD: Has aortic atherosclerosis. 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  No significant stenosis of the intracranial circulation.   Patient Instructions  Medication Instructions:  Continue same medications *If you need a refill on your cardiac medications before your next appointment, please call your pharmacy*   Lab Work: None ordered   Testing/Procedures: None ordered   Follow-Up: At [facility], you and your health needs are our priority.  As part of our continuing mission to provide you with exceptional heart care, we have created designated  Provider Care Teams.  These Care Teams include your primary Cardiologist (physician) and Advanced Practice Providers (APPs -  Physician Assistants and Nurse Practitioners) who all work together to provide you with the care you need, when you need it.  We recommend signing up for the patient portal called "MyChart".  Sign up information is provided on this After Visit Summary.  MyChart is used to connect with patients for Virtual Visits (Telemedicine).  Patients are able to view lab/test results, encounter notes, upcoming appointments, etc.  Non-urgent messages can be sent to your provider as well.   To learn more about what you can do with MyChart, go to [email or web address].  Your next appointment:  6 months   Call in March to schedule June appointment     The format for your next appointment: Office   Provider: Dr.Yacqub Baston   Important Information About Sugar        Signed, Sanda Klein, MD  09/17/2022 10:06 AM    [place]

## 2022-09-14 ENCOUNTER — Ambulatory Visit (INDEPENDENT_AMBULATORY_CARE_PROVIDER_SITE_OTHER): Payer: Medicare Other | Admitting: Clinical

## 2022-09-14 DIAGNOSIS — F4323 Adjustment disorder with mixed anxiety and depressed mood: Secondary | ICD-10-CM

## 2022-09-14 NOTE — Progress Notes (Signed)
[place] Counselor/Therapist Progress Note  Patient ID: [name], MRN: [id number]    Date: 09/14/22  Time Spent: 9:40  am - 10:26 am : 46 Minutes  Treatment Type: Individual Therapy.  Reported Symptoms: Patient reported difficulty recalling previous events and directions to places previously visited.  Mental Status Exam: Appearance:  Well Groomed     Behavior: Appropriate  Motor: Normal  Speech/Language:  Clear and Coherent  Affect: Appropriate  Mood: normal  Thought process: normal  Thought content:   WNL  Sensory/Perceptual disturbances:   WNL  Orientation: oriented to person, place, and situation  Attention: Good  Concentration: Good  Memory: Patient reported difficulty recalling previous events and directions to places previously visited  Fund of knowledge:  Good  Insight:   Good  Judgment:  Good  Impulse Control: Good   Risk Assessment: Danger to Self:  No Patient denied current suicidal ideation Self-injurious Behavior: No Danger to Others: No Patient denied current homicidal ideation Duty to Warn:no Physical Aggression / Violence:No  Access to Firearms a concern: No  Gang Involvement:No   Subjective:  Patient stated, "probably about the same" as it relates to mood since last session.  Patient reported difficulty recalling events from the past, such as, her husband's death, her son's death, and previously trips. Patient reported difficulty initially recalling directions to places she has been going to for years, but reported she is able to recall the directions once she is in the vicinity of the location. Patient reported she uses her calendar to keep track of daily tasks. Patient reported feeling depressed at times. Patient stated, "I have to make myself do things". Patient inquired about medications for treatment of anxiety and depression. Patient reported difficulty verbally expressing herself during conversations and difficulty finding  words during conversations. Patient stated, "I'm worried about my forgetfulness". Patient reported "its not anything I really want to do but I would be willing to try it for a while to see if it would help me" in response to participation in therapy.   Interventions: Motivational Interviewing. Discussed patient's concerns regarding changes in memory. Clinician reviewed diagnosis and treatment recommendations. Provided psycho education related to diagnosis and treatment. Provided psycho education related to symptoms of anxiety, cycle of anxiety, and depressive symptoms. Provided psycho education related to normal age related changes in cognition. Provided psycho education related to use of psychotropic medication in treatment of anxiety and depression. Clinician recommendation patient follow up with her medical provider in response to patient's inquiry about medications.   Diagnosis:  Adjustment disorder with mixed anxiety and depressed mood   Plan: Goals to be completed during follow up appointment on 10/13/22.             Katherina Right, LCSW

## 2022-09-16 ENCOUNTER — Other Ambulatory Visit: Payer: Self-pay | Admitting: Neurological Surgery

## 2022-09-16 DIAGNOSIS — M4726 Other spondylosis with radiculopathy, lumbar region: Secondary | ICD-10-CM

## 2022-09-20 ENCOUNTER — Other Ambulatory Visit: Payer: Self-pay | Admitting: Family Medicine

## 2022-09-21 NOTE — Telephone Encounter (Signed)
Name of Medication: Tramadol Name of Pharmacy: [facility] or Written Date and Quantity: 08/23/22, #40 Last Office Visit and Type: 06/24/22, CPE Next Office Visit and Type: none Last Controlled Substance Agreement Date: none Last UDS: none

## 2022-09-22 NOTE — Telephone Encounter (Signed)
ERx 

## 2022-10-05 ENCOUNTER — Encounter: Payer: Self-pay | Admitting: Family Medicine

## 2022-10-05 ENCOUNTER — Ambulatory Visit (INDEPENDENT_AMBULATORY_CARE_PROVIDER_SITE_OTHER): Payer: Medicare Other | Admitting: Family Medicine

## 2022-10-05 VITALS — BP 124/70 | HR 72 | Temp 98.2°F | Ht 62.0 in | Wt 149.6 lb

## 2022-10-05 DIAGNOSIS — F4323 Adjustment disorder with mixed anxiety and depressed mood: Secondary | ICD-10-CM | POA: Diagnosis not present

## 2022-10-05 DIAGNOSIS — R5383 Other fatigue: Secondary | ICD-10-CM | POA: Diagnosis not present

## 2022-10-05 DIAGNOSIS — L97901 Non-pressure chronic ulcer of unspecified part of unspecified lower leg limited to breakdown of skin: Secondary | ICD-10-CM

## 2022-10-05 DIAGNOSIS — F5104 Psychophysiologic insomnia: Secondary | ICD-10-CM | POA: Diagnosis not present

## 2022-10-05 DIAGNOSIS — M545 Low back pain, unspecified: Secondary | ICD-10-CM

## 2022-10-05 DIAGNOSIS — G8929 Other chronic pain: Secondary | ICD-10-CM

## 2022-10-05 MED ORDER — ASCORBIC ACID 500 MG PO TABS: 500.0000 mg | ORAL_TABLET | Freq: Every day | ORAL | Status: DC

## 2022-10-05 MED ORDER — BUPROPION HCL ER (SR) 100 MG PO TB12: 100.0000 mg | ORAL_TABLET | Freq: Every morning | ORAL | 3 refills | Status: DC

## 2022-10-05 NOTE — Assessment & Plan Note (Signed)
Sees Dr Ellene Route.  Now on gabapentin '300mg'$  nightly.

## 2022-10-05 NOTE — Assessment & Plan Note (Addendum)
Chronic insomnia with associated daytime somnolence. Reviewed sleep hygiene measures, including decreased naps during day, checklist provided.  ESS = 12 however doubt consistent with sleep apnea.  Unsure how effective trazodone has been - will taper off.  Temazepam previously stopped due to concern over effect on memory. See above.

## 2022-10-05 NOTE — Assessment & Plan Note (Addendum)
Overall recent diagnosis.  PHQ9 worse over the past 3 months.  She has started counseling - continue this. Will trial wellbutrin SR '100mg'$  daily in am hopeful to help mood, energy. RTC 4-6 wks f/u visit. Pt agrees with plan.

## 2022-10-05 NOTE — Progress Notes (Signed)
Patient ID: [name], female    DOB: 11-Nov-1945, 76 y.o.   MRN: [id number]  This visit was conducted in person.  BP 124/70 (BP Location: Right Arm, Cuff Size: Normal)   Pulse 72   Temp 98.2 F (36.8 C) (Temporal)   Ht '5\' 2"'$  (1.575 m)   Wt 149 lb 9.6 oz (67.9 kg)   SpO2 96%   BMI 27.36 kg/m    CC: fatigue  Subjective:   HPI: [name] is a 76 y.o. female presenting on 10/05/2022 for Fatigue (C/o trouble staying awake. Feels tired all the time. Pt reports once she's sitting still, she falls asleep. Started about 2 mos ago. Discussed with cards and told could be anxiety, depression or part of aging. )   Spent Christmas with son, DIL and grand daughter.  2 mo h/o increased constant fatigue associated with daytime sleepiness. Notes no motivation to do things. 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 Notes mild exertional dyspnea ie taking trash can to street. She still enjoys reading but notes she's not reading as much now. She continues enjoying playing mahjong on laptop. No recent med changes.  Recently saw cardiology early 09/2022 - stable cardiac evaluation.  Saw neurology last month - neuropsychological testing returned WNL, ?mood related however brain imaging did show minimal chronic microhemorrhages, ?suggestive of cerebral amyloid angiopathy - rec monitoring for progression of cognitive problems and f/u if noted. F/u left PRN.  She has been seeing Katherina Right counselor - recent dx adjustment disorder with anxiety/depression. Planning to return to see counselor in 1-2 wks.   She is on trazodone '50mg'$  at night time as well as gabapentin '300mg'$  at night. Sometimes helpful.  She does wake up at 3-4am 5 days a week and stays wide awake - so she gets up to take a shower.   + daytime sleepiness. She sleeps in her recliner.  Takes naps throughout the day.  No snoring, no PNdyspnea.  No morning headaches.   No seizure history.   Chronic lower back pain  due to DISH - seeing Dr Ellene Route.  She is on gabapentin '300mg'$  nightly through him.   Notes left lateral heel pain when she wakes up in the mornings. Denies inciting injury or trauma.      Relevant past medical, surgical, family and social history reviewed and updated as indicated. Interim medical history since our last visit reviewed. Allergies and medications reviewed and updated. Outpatient Medications Prior to Visit  Medication Sig Dispense Refill   acetaminophen (TYLENOL) 500 MG tablet Take 2 tablets (1,000 mg total) by mouth daily.     albuterol (VENTOLIN HFA) 108 (90 Base) MCG/ACT inhaler Inhale 2 puffs into the lungs every 6 (six) hours as needed for wheezing. 18 g 6   apixaban (ELIQUIS) 5 MG TABS tablet TAKE 1 TABLET(5 MG) BY MOUTH TWICE DAILY 60 tablet 5   Biotin 1000 MCG tablet Take 1,000 mcg by mouth daily.     calcium carbonate (OSCAL) 1500 (600 Ca) MG TABS tablet Take 600 mg of elemental calcium by mouth daily.     carboxymethylcellulose (REFRESH PLUS) 0.5 % SOLN Place 1 drop into both eyes 3 (three) times daily as needed (dry eyes).     cephALEXin (KEFLEX) 250 MG capsule Take 250 mg by mouth daily.     Cholecalciferol (VITAMIN D) 50 MCG (2000 UT) tablet Take 2,000 Units by mouth daily.     docusate sodium (COLACE) 100 MG capsule Take 200 mg  by mouth every evening.     furosemide (LASIX) 40 MG tablet Take 1 tablet (40 mg total) by mouth daily. May take an extra tablet daily as needed for swelling. 135 tablet 2   gabapentin (NEURONTIN) 300 MG capsule Take 300 mg by mouth at bedtime.     ipratropium-albuterol (DUONEB) 0.5-2.5 (3) MG/3ML SOLN Take 3 mLs by nebulization every 6 (six) hours as needed. 360 mL 0   lovastatin (MEVACOR) 40 MG tablet TAKE 1 TABLET(40 MG) BY MOUTH AT BEDTIME 90 tablet 1   montelukast (SINGULAIR) 10 MG tablet TAKE 1 TABLET(10 MG) BY MOUTH AT BEDTIME 90 tablet 1   Multiple Vitamins-Minerals (PRESERVISION AREDS 2 PO) Take 2 capsules by mouth every evening.       nitrofurantoin, macrocrystal-monohydrate, (MACROBID) 100 MG capsule Take 100 mg by mouth 2 (two) times daily.     polyethylene glycol (MIRALAX / GLYCOLAX) packet Take 17 g by mouth daily. 14 each 0   potassium chloride (KLOR-CON) 10 MEQ tablet TAKE 2 TABLETS(20 MEQ) BY MOUTH DAILY 180 tablet 3   silver sulfADIAZINE (SILVADENE) 1 % cream Apply 1 application topically daily. 50 g 1   traMADol (ULTRAM) 50 MG tablet TAKE 1 TABLET(50 MG) BY MOUTH TWICE DAILY AS NEEDED 40 tablet 0   TRELEGY ELLIPTA 200-62.5-25 MCG/ACT AEPB INHALE 1 PUFF INTO THE LUNGS DAILY 60 each 11   Vibegron (GEMTESA) 75 MG TABS Take 75 mg by mouth daily.     vitamin B-12 (CYANOCOBALAMIN) 500 MCG tablet Take 1 tablet (500 mcg total) by mouth 2 (two) times a week.     Zinc 50 MG CAPS Take 1 capsule (50 mg total) by mouth daily.  0   traZODone (DESYREL) 50 MG tablet TAKE 1/2 TO 1 TABLET(25 TO 50 MG) BY MOUTH AT BEDTIME 30 tablet 2   Facility-Administered Medications Prior to Visit  Medication Dose Route Frequency Provider Last Rate Last Admin   ondansetron (ZOFRAN) 4 mg in sodium chloride 0.9 % 50 mL IVPB  4 mg Intravenous Q6H PRN Kristeen Miss, MD         Per HPI unless specifically indicated in ROS section below Review of Systems  Objective:  BP 124/70 (BP Location: Right Arm, Cuff Size: Normal)   Pulse 72   Temp 98.2 F (36.8 C) (Temporal)   Ht '5\' 2"'$  (1.575 m)   Wt 149 lb 9.6 oz (67.9 kg)   SpO2 96%   BMI 27.36 kg/m   Wt Readings from Last 3 Encounters:  10/05/22 149 lb 9.6 oz (67.9 kg)  09/12/22 148 lb 12.8 oz (67.5 kg)  08/19/22 152 lb (68.9 kg)      Physical Exam Vitals and nursing note reviewed.  Constitutional:      Appearance: Normal appearance. She is not ill-appearing.  Cardiovascular:     Rate and Rhythm: Normal rate and regular rhythm.     Pulses: Normal pulses.     Heart sounds: Murmur heard.  Pulmonary:     Effort: Pulmonary effort is normal. No respiratory distress.     Breath sounds: Normal  breath sounds. No wheezing, rhonchi or rales.  Musculoskeletal:        General: Swelling and tenderness present.     Right lower leg: Edema (tr) present.     Left lower leg: Edema (tr) present.     Comments:  2+ DP bilaterally Dry nontender scab to left lateral malleolus without surrounding erythema or drainage Ulcer to right medial lower leg   Skin:  General: Skin is warm and dry.  Neurological:     Mental Status: She is alert.       Lab Results  Component Value Date   TSH 1.18 01/03/2022    Lab Results  Component Value Date   XJOITGPQ98 264 06/22/2022   Lab Results  Component Value Date   VD25OH 63.83 06/22/2022    No results found for: "RPR"  No results found for: "HIV1X2"  Lab Results  Component Value Date   WBC 6.8 06/22/2022   HGB 14.0 06/22/2022   HCT 42.6 06/22/2022   MCV 101.4 (H) 06/22/2022   PLT 96.0 (L) 06/22/2022    Lab Results  Component Value Date   CREATININE 0.94 06/22/2022   BUN 17 06/22/2022   NA 143 06/22/2022   K 3.7 06/22/2022   CL 102 06/22/2022   CO2 31 06/22/2022    Lab Results  Component Value Date   ALT 15 06/22/2022   AST 22 06/22/2022   ALKPHOS 85 06/22/2022   BILITOT 0.6 06/22/2022       10/05/2022   12:51 PM 06/23/2022    2:24 PM 06/16/2021    8:49 AM 06/10/2020    9:26 AM 05/02/2019    8:33 AM  Depression screen PHQ 2/9  Decreased Interest 1 0 0 0 0  Down, Depressed, Hopeless 2 1 0 0 0  PHQ - 2 Score 3 1 0 0 0  Altered sleeping 3 2     Tired, decreased energy 3 2     Change in appetite 3 0     Feeling bad or failure about yourself  1 0     Trouble concentrating 1 1     Moving slowly or fidgety/restless 1 0     Suicidal thoughts 0 0     PHQ-9 Score 15 6     Difficult doing work/chores  Somewhat difficult          10/05/2022   12:52 PM  GAD 7 : Generalized Anxiety Score  Nervous, Anxious, on Edge 1  Control/stop worrying 1  Worry too much - different things 1  Trouble relaxing 0  Restless 0  Easily annoyed  or irritable 0  Afraid - awful might happen 1  Total GAD 7 Score 4  Anxiety Difficulty Somewhat difficult   Assessment & Plan:   Problem List Items Addressed This Visit     Chronic lower back pain    Sees Dr Ellene Route.  Now on gabapentin '300mg'$  nightly.      Relevant Medications   buPROPion ER (WELLBUTRIN SR) 100 MG 12 hr tablet   Adjustment disorder with mixed anxiety and depressed mood - Primary    Overall recent diagnosis.  PHQ9 worse over the past 3 months.  She has started counseling - continue this. Will trial wellbutrin SR '100mg'$  daily in am hopeful to help mood, energy. RTC 4-6 wks f/u visit. Pt agrees with plan.       Chronic insomnia    Chronic insomnia with associated daytime somnolence. Reviewed sleep hygiene measures, including decreased naps during day, checklist provided.  ESS = 12 however doubt consistent with sleep apnea.  Unsure how effective trazodone has been - will taper off.  Temazepam previously stopped due to concern over effect on memory. See above.       Leg ulcer ([place])    She has residual dry scab over what appears to be chronic ulcer overlying left lateral malleolus. Suspect component of pressure injury - rec bandaid  daily until healed, update if worsening for further evaluation. She also has second fresh shallow ulcer to right medial leg  - rec bandaid on this daily as well. Recommend leg elevation and compression stocking use, update if not healing as expected.  Doubt arterial insufficiency given good pulses palpated today. Regardless, consider ABI if not healing.       Fatigue    2 months of increased fatigue, sleepiness, lack of motivation. Recent reassuring labwork reviewed. S/p recent reassuring cardiac evaluation as well. Reassuring exam today.  Symptoms could be mood related - see below.         Meds ordered this encounter  Medications   ascorbic acid (VITAMIN C) 500 MG tablet    Sig: Take 1 tablet (500 mg total) by mouth daily.    buPROPion ER (WELLBUTRIN SR) 100 MG 12 hr tablet    Sig: Take 1 tablet (100 mg total) by mouth every morning.    Dispense:  30 tablet    Refill:  3   No orders of the defined types were placed in this encounter.   Patient Instructions  Taper off trazodone - to 1/2 tablet nightly for a few weeks then stop.  Trial wellbutrin '100mg'$  SR daily in the morning.  Try not to get up when you wake up in the early morning.  Try memory foam pillow for sleep.  Return in 4-6 weeks for follow up visit.   Bedtime routine checklist: 1. Avoid naps during the day 2. Avoid stimulants such as caffeine and nicotine. Avoid bedtime alcohol (it can speed onset of sleep but the body's metabolism can cause awakenings). 3. All forms of exercise help ensure sound sleep - limit vigorous exercise to morning or late afternoon 4. Avoid food too close to bedtime including chocolate (which contains caffeine) 5. Soak up natural light 6. Establish regular bedtime routine. 7. Associate bed with sleep - avoid TV, computer or phone, reading while in bed. 8. Ensure pleasant, relaxing sleep environment - quiet, dark, cool room.   Follow up plan: Return if symptoms worsen or fail to improve.  Ria Bush, MD

## 2022-10-05 NOTE — Assessment & Plan Note (Signed)
She has residual dry scab over what appears to be chronic ulcer overlying left lateral malleolus. Suspect component of pressure injury - rec bandaid daily until healed, update if worsening for further evaluation. She also has second fresh shallow ulcer to right medial leg  - rec bandaid on this daily as well. Recommend leg elevation and compression stocking use, update if not healing as expected.  Doubt arterial insufficiency given good pulses palpated today. Regardless, consider ABI if not healing.

## 2022-10-05 NOTE — Patient Instructions (Addendum)
Taper off trazodone - to 1/2 tablet nightly for a few weeks then stop.  Trial wellbutrin '100mg'$  SR daily in the morning.  Try not to get up when you wake up in the early morning.  Try memory foam pillow for sleep.  Return in 4-6 weeks for follow up visit.   Bedtime routine checklist: 1. Avoid naps during the day 2. Avoid stimulants such as caffeine and nicotine. Avoid bedtime alcohol (it can speed onset of sleep but the body's metabolism can cause awakenings). 3. All forms of exercise help ensure sound sleep - limit vigorous exercise to morning or late afternoon 4. Avoid food too close to bedtime including chocolate (which contains caffeine) 5. Soak up natural light 6. Establish regular bedtime routine. 7. Associate bed with sleep - avoid TV, computer or phone, reading while in bed. 8. Ensure pleasant, relaxing sleep environment - quiet, dark, cool room.

## 2022-10-05 NOTE — Assessment & Plan Note (Addendum)
2 months of increased fatigue, sleepiness, lack of motivation. Recent reassuring labwork reviewed. S/p recent reassuring cardiac evaluation as well. Reassuring exam today.  Symptoms could be mood related - see below.

## 2022-10-06 ENCOUNTER — Ambulatory Visit (HOSPITAL_COMMUNITY): Payer: Medicare Other | Attending: Cardiovascular Disease

## 2022-10-06 DIAGNOSIS — I5032 Chronic diastolic (congestive) heart failure: Secondary | ICD-10-CM | POA: Diagnosis present

## 2022-10-06 LAB — ECHOCARDIOGRAM COMPLETE
AV Mean grad: 14 mmHg
AV Peak grad: 26.1 mmHg
Ao pk vel: 2.56 m/s
Area-P 1/2: 2.28 cm2
S' Lateral: 2.9 cm

## 2022-10-07 ENCOUNTER — Other Ambulatory Visit: Payer: Self-pay | Admitting: Family Medicine

## 2022-10-07 ENCOUNTER — Ambulatory Visit
Admission: RE | Admit: 2022-10-07 | Discharge: 2022-10-07 | Disposition: A | Discharge status: Home / Self Care | Payer: Medicare Other | Source: Ambulatory Visit | Attending: Neurological Surgery | Admitting: Neurological Surgery

## 2022-10-07 DIAGNOSIS — M4726 Other spondylosis with radiculopathy, lumbar region: Secondary | ICD-10-CM

## 2022-10-09 ENCOUNTER — Emergency Department: Payer: Medicare Other

## 2022-10-09 ENCOUNTER — Observation Stay
Admission: EM | Admit: 2022-10-09 | Discharge: 2022-10-09 | Disposition: A | Discharge status: Home / Self Care | Payer: Medicare Other | Attending: Emergency Medicine | Admitting: Emergency Medicine

## 2022-10-09 ENCOUNTER — Other Ambulatory Visit: Payer: Self-pay

## 2022-10-09 DIAGNOSIS — N182 Chronic kidney disease, stage 2 (mild): Secondary | ICD-10-CM | POA: Diagnosis not present

## 2022-10-09 DIAGNOSIS — Z1152 Encounter for screening for COVID-19: Secondary | ICD-10-CM | POA: Insufficient documentation

## 2022-10-09 DIAGNOSIS — Z87891 Personal history of nicotine dependence: Secondary | ICD-10-CM | POA: Diagnosis not present

## 2022-10-09 DIAGNOSIS — Z7901 Long term (current) use of anticoagulants: Secondary | ICD-10-CM | POA: Diagnosis not present

## 2022-10-09 DIAGNOSIS — I5032 Chronic diastolic (congestive) heart failure: Secondary | ICD-10-CM | POA: Diagnosis not present

## 2022-10-09 DIAGNOSIS — Z85048 Personal history of other malignant neoplasm of rectum, rectosigmoid junction, and anus: Secondary | ICD-10-CM | POA: Insufficient documentation

## 2022-10-09 DIAGNOSIS — Z85828 Personal history of other malignant neoplasm of skin: Secondary | ICD-10-CM | POA: Insufficient documentation

## 2022-10-09 DIAGNOSIS — N3001 Acute cystitis with hematuria: Secondary | ICD-10-CM | POA: Diagnosis not present

## 2022-10-09 DIAGNOSIS — R410 Disorientation, unspecified: Secondary | ICD-10-CM

## 2022-10-09 DIAGNOSIS — Z952 Presence of prosthetic heart valve: Secondary | ICD-10-CM | POA: Insufficient documentation

## 2022-10-09 DIAGNOSIS — R4182 Altered mental status, unspecified: Secondary | ICD-10-CM | POA: Diagnosis not present

## 2022-10-09 DIAGNOSIS — Z79899 Other long term (current) drug therapy: Secondary | ICD-10-CM | POA: Diagnosis not present

## 2022-10-09 DIAGNOSIS — J449 Chronic obstructive pulmonary disease, unspecified: Secondary | ICD-10-CM | POA: Insufficient documentation

## 2022-10-09 DIAGNOSIS — R110 Nausea: Secondary | ICD-10-CM | POA: Diagnosis not present

## 2022-10-09 DIAGNOSIS — R519 Headache, unspecified: Secondary | ICD-10-CM | POA: Diagnosis not present

## 2022-10-09 DIAGNOSIS — R059 Cough, unspecified: Secondary | ICD-10-CM | POA: Insufficient documentation

## 2022-10-09 DIAGNOSIS — R5383 Other fatigue: Secondary | ICD-10-CM | POA: Insufficient documentation

## 2022-10-09 DIAGNOSIS — I4819 Other persistent atrial fibrillation: Secondary | ICD-10-CM | POA: Insufficient documentation

## 2022-10-09 DIAGNOSIS — I130 Hypertensive heart and chronic kidney disease with heart failure and stage 1 through stage 4 chronic kidney disease, or unspecified chronic kidney disease: Secondary | ICD-10-CM | POA: Insufficient documentation

## 2022-10-09 DIAGNOSIS — R531 Weakness: Secondary | ICD-10-CM | POA: Diagnosis present

## 2022-10-09 DIAGNOSIS — N390 Urinary tract infection, site not specified: Secondary | ICD-10-CM | POA: Diagnosis present

## 2022-10-09 LAB — URINALYSIS, ROUTINE W REFLEX MICROSCOPIC
Bilirubin Urine: NEGATIVE
Bilirubin Urine: NEGATIVE
Glucose, UA: NEGATIVE mg/dL
Glucose, UA: NEGATIVE mg/dL
Hgb urine dipstick: NEGATIVE
Ketones, ur: 5 mg/dL — AB
Ketones, ur: NEGATIVE mg/dL
Leukocytes,Ua: NEGATIVE
Nitrite: NEGATIVE
Nitrite: NEGATIVE
Protein, ur: 100 mg/dL — AB
Protein, ur: 100 mg/dL — AB
RBC / HPF: >50 RBC/hpf — ABNORMAL HIGH (ref 0–5)
Specific Gravity, Urine: 1.025 (ref 1.005–1.030)
Specific Gravity, Urine: 1.02 (ref 1.005–1.030)
Squamous Epithelial / HPF: NONE SEEN /HPF (ref 0–5)
pH: 5 (ref 5.0–8.0)
pH: 6 (ref 5.0–8.0)

## 2022-10-09 LAB — BLOOD GAS, VENOUS
Acid-Base Excess: 9.1 mmol/L — ABNORMAL HIGH (ref 0.0–2.0)
Bicarbonate: 34.2 mmol/L — ABNORMAL HIGH (ref 20.0–28.0)
O2 Saturation: 93 %
Patient temperature: 37
pCO2, Ven: 47 mmHg (ref 44–60)
pH, Ven: 7.47 — ABNORMAL HIGH (ref 7.25–7.43)
pO2, Ven: 63 mmHg — ABNORMAL HIGH (ref 32–45)

## 2022-10-09 LAB — CBC
HCT: 40.1 % (ref 36.0–46.0)
Hemoglobin: 12.4 g/dL (ref 12.0–15.0)
MCH: 32.2 pg (ref 26.0–34.0)
MCHC: 30.9 g/dL (ref 30.0–36.0)
MCV: 104.2 fL — ABNORMAL HIGH (ref 80.0–100.0)
Platelets: 78 10*3/uL — ABNORMAL LOW (ref 150–400)
RBC: 3.85 MIL/uL — ABNORMAL LOW (ref 3.87–5.11)
RDW: 15.1 % (ref 11.5–15.5)
WBC: 4.8 10*3/uL (ref 4.0–10.5)
nRBC: 0 % (ref 0.0–0.2)

## 2022-10-09 LAB — RESP PANEL BY RT-PCR (RSV, FLU A&B, COVID)  RVPGX2
Influenza A by PCR: NEGATIVE
Influenza B by PCR: NEGATIVE
Resp Syncytial Virus by PCR: NEGATIVE
SARS Coronavirus 2 by RT PCR: NEGATIVE

## 2022-10-09 LAB — BASIC METABOLIC PANEL
Anion gap: 10 (ref 5–15)
BUN: 14 mg/dL (ref 8–23)
CO2: 29 mmol/L (ref 22–32)
Calcium: 9.2 mg/dL (ref 8.9–10.3)
Chloride: 100 mmol/L (ref 98–111)
Creatinine, Ser: 0.8 mg/dL (ref 0.44–1.00)
GFR, Estimated: >60 mL/min (ref >60)
Glucose, Bld: 139 mg/dL — ABNORMAL HIGH (ref 70–99)
Potassium: 3.9 mmol/L (ref 3.5–5.1)
Sodium: 139 mmol/L (ref 135–145)

## 2022-10-09 LAB — CBG MONITORING, ED: Glucose-Capillary: 141 mg/dL — ABNORMAL HIGH (ref 70–99)

## 2022-10-09 MED ORDER — SODIUM CHLORIDE 0.9 % IV SOLN
1.0000 g | Freq: Once | INTRAVENOUS | Status: AC
  Administered 2022-10-09: 1 g via INTRAVENOUS
  Filled 2022-10-09: qty 10

## 2022-10-09 MED ORDER — CEPHALEXIN 500 MG PO CAPS: 500.0000 mg | ORAL_CAPSULE | Freq: Four times a day (QID) | ORAL | 0 refills | Status: AC

## 2022-10-09 NOTE — ED Triage Notes (Addendum)
Pt states last night she was very tied and then this morning she was having problems answering questions- pt was not able to tell her friend todays date- pt states she feels confused- pt denies painful urination, but states yesterday she was not able to make it to the bathroom before she soiled herself- pt able to answer all questions appropriately

## 2022-10-09 NOTE — Assessment & Plan Note (Signed)
-   In/out cath for clean urine culture - One-time dose of Rocephin discussed with Dr. Jori Moll - Discharge home with course of antibiotics based on prior urine culture data - Follow-up urine culture

## 2022-10-09 NOTE — ED Provider Notes (Addendum)
[facility] Provider Note    Event Date/Time   First MD Initiated Contact with Patient 10/09/22 1113     (approximate)   History   Weakness   HPI  [name] is a 76 y.o. 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  History is provided by the patient and her friend who is at bedside.  States that yesterday she felt very tired and fatigued throughout the entire day.  Was complaining of a mild headache which is not normal for her.  Today states that her friend called her and she was talking to her on the phone and was not making sense.  States that she feels strangely and believes that she should know the answer to the questions she is being asked.  Does not recognize her friend at bedside.  Denies any recent falls or head trauma.  Denies significant cough, shortness of breath, chest pain, abdominal pain.  Does endorse urinary incontinence over the past couple of days.  Denies any burning with urination.  Decreased p.o. intake intentionally.     Physical Exam   Triage Vital Signs: ED Triage Vitals  Enc Vitals Group     BP 10/09/22 1023 (!) 146/74     Pulse Rate 10/09/22 1023 99     Resp 10/09/22 1023 18     Temp 10/09/22 1023 98.3 F (36.8 C)     Temp Source 10/09/22 1023 Oral     SpO2 10/09/22 1023 94 %     Weight --      Height --      Head Circumference --      Peak Flow --      Pain Score 10/09/22 1024 0     Pain Loc --      Pain Edu? --      Excl. in [place]? --     Most recent vital signs: Vitals:   10/09/22 1023  BP: (!) 146/74  Pulse: 99  Resp: 18  Temp: 98.3 F (36.8 C)  SpO2: 94%    Physical Exam Constitutional:      Appearance: She is well-developed.  HENT:     Head: Atraumatic.  Eyes:     Conjunctiva/sclera: Conjunctivae normal.   Cardiovascular:     Rate and Rhythm: Regular rhythm.  Pulmonary:     Effort: No respiratory distress.  Abdominal:     General: There is no distension.  Musculoskeletal:        General: Normal range of motion.     Cervical back: Normal range of motion.  Skin:    General: Skin is warm.  Neurological:     Mental Status: She is alert. She is disoriented.      IMPRESSION / MDM / ASSESSMENT AND PLAN / ED COURSE  I reviewed the triage vital signs and the nursing notes.  Differential diagnosis including urinary tract infection, electrolyte abnormality, dehydration, intracranial hemorrhage, viral illness including COVID/influenza   RADIOLOGY I independently reviewed imaging, my interpretation of imaging: CT scan of the head with no signs of intracranial hemorrhage or infarction.  Chest x-ray with no focal findings consistent with pneumonia.   Labs (all labs ordered are listed, but only abnormal results are displayed) Labs interpreted as -  Lab work with findings concerning for possible urinary tract infection however does have epithelial cells.  Urine culture added on.  Given that the patient has had incontinence we will treat the patient for urinary tract infection.  After discussion with the hospitalist wants to 1 out of and out catheterization and repeat UA.    Labs Reviewed  BASIC METABOLIC PANEL - Abnormal; Notable for the following components:      Result Value   Glucose, Bld 139 (*)    All other components within normal limits  CBC - Abnormal; Notable for the following components:   RBC 3.85 (*)    MCV 104.2 (*)    Platelets 78 (*)    All other components within normal limits  URINALYSIS, ROUTINE W REFLEX MICROSCOPIC - Abnormal; Notable for the following components:   Color, Urine AMBER (*)    APPearance HAZY (*)    Hgb urine dipstick SMALL (*)    Ketones, ur 5 (*)    Protein, ur 100 (*)    Leukocytes,Ua TRACE (*)    RBC / HPF >50 (*)    Bacteria, UA RARE (*)     All other components within normal limits  BLOOD GAS, VENOUS - Abnormal; Notable for the following components:   pH, Ven 7.47 (*)    pO2, Ven 63 (*)    Bicarbonate 34.2 (*)    Acid-Base Excess 9.1 (*)    All other components within normal limits  URINALYSIS, ROUTINE W REFLEX MICROSCOPIC - Abnormal; Notable for the following components:   Color, Urine YELLOW (*)    APPearance CLEAR (*)    Protein, ur 100 (*)    Bacteria, UA RARE (*)    All other components within normal limits  CBG MONITORING, ED - Abnormal; Notable for the following components:   Glucose-Capillary 141 (*)    All other components within normal limits  RESP PANEL BY RT-PCR (RSV, FLU A&B, COVID)  [id number]  URINE CULTURE  URINE CULTURE    COVID and influenza testing are negative.  On chart review patient had workup for memory issues with neurology including outpatient workup in the past 1 week.  This does sound to have a change from her evaluation with neurology.  Patient will be admitted for altered mental status.   When hospitalist went to evaluate the patient her son is now there and she is back to her baseline.  Recommended In-N-Out for urine and starting on treatment for urinary tract infection.  Ultimately after shared decision making patient and her son wants to go home and follow-up as an outpatient.  Will return if her symptoms return or worsen.  Given a dose of IV Rocephin in the emergency department and will start on Keflex.   PROCEDURES:  Critical Care performed: No  Procedures  Patient's presentation is most consistent with acute presentation with potential threat to life or bodily function.   MEDICATIONS ORDERED IN ED: Medications  cefTRIAXone (ROCEPHIN) 1 g in sodium chloride 0.9 % 100 mL IVPB (0 g Intravenous Stopped 10/09/22 1352)    FINAL CLINICAL IMPRESSION(S) / ED DIAGNOSES   Final diagnoses:  Altered mental status, unspecified altered mental status type     Rx / DC Orders   ED  Discharge Orders          Ordered    cephALEXin (KEFLEX) 500 MG capsule  4 times daily        10/09/22 1327             Note:  This document was prepared using Dragon voice recognition software and may include unintentional dictation errors.  Nathaniel Man, MD 10/09/22 1322    Nathaniel Man, MD 10/09/22 1359

## 2022-10-09 NOTE — Assessment & Plan Note (Signed)
Patient presenting with 1 day history of large-volume incontinence and memory deficits that occurred this morning.  Her memory is currently back to baseline with no focal findings on examination.  Per chart review, patient has a history over the last several weeks of subjective memory loss and has undergone extensive neuropsychiatric testing with neurology.  In addition, she has symptoms of chronic fatigue and generalized weakness in the setting of insomnia followed by her PCP and cardiology as well.  Given lack of focal findings, CVA is low on the differential.  Seizure is also low on the differential.  Given she is back to baseline and urinary symptoms, likely secondary to urinary tract infection.  Recommend in/out cath for a clean urine culture and discharged with broad-spectrum antibiotics.  I discussed treatment options including staying in the hospital for observation with antibiotics versus going home with antibiotics and close observation by family member.  Patient strongly prefers going home.  I discussed with patient's son at bedside, [name].  He states he will stay with his mom and monitor her.  - No further workup indicated at this time - Discharge home with close monitoring by family.

## 2022-10-09 NOTE — Consult Note (Signed)
Initial Consultation Note   Patient: [name] ZES:[id number] DOB: Jan 04, 1946 PCP: Ria Bush, MD DOA: 10/09/2022 DOS: the patient was seen and examined on 10/09/2022 Primary service: Nathaniel Man, MD  Referring physician: Dr. Jori Moll Reason for consult: Altered mental status  Assessment/Plan:  Assessment and Plan: * Altered mental status Patient presenting with 1 day history of large-volume incontinence and memory deficits that occurred this morning.  Her memory is currently back to baseline with no focal findings on examination.  Per chart review, patient has a history over the last several weeks of subjective memory loss and has undergone extensive neuropsychiatric testing with neurology.  In addition, she has symptoms of chronic fatigue and generalized weakness in the setting of insomnia followed by her PCP and cardiology as well.  Given lack of focal findings, CVA is low on the differential.  Seizure is also low on the differential.  Given she is back to baseline and urinary symptoms, likely secondary to urinary tract infection.  Recommend in/out cath for a clean urine culture and discharged with broad-spectrum antibiotics.  I discussed treatment options including staying in the hospital for observation with antibiotics versus going home with antibiotics and close observation by family member.  Patient strongly prefers going home.  I discussed with patient's son at bedside, [name].  He states he will stay with his mom and monitor her.  - No further workup indicated at this time - Discharge home with close monitoring by family.  UTI (urinary tract infection) - In/out cath for clean urine culture - One-time dose of Rocephin discussed with Dr. Jori Moll - Discharge home with course of antibiotics based on prior urine culture data - Follow-up urine culture   TR[facility] will sign off at present, please call us again when needed.   HPI: [name] is a 76 y.o. 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   [name] states that yesterday, she did have 1 episode of urinary incontinence that was large-volume compared to her usual dribble only of urine.  She did not experience any dysuria and denies any current dysuria.  This morning when her friend called her, she could recalled the ER or whom she was speaking to.  When her son called her, he states that she believed it was December 5 and she was disoriented.  Since arriving to the ED, her memory has improved and seems back to baseline at this time.  [name] denies any complaints of fever, chills, vomiting, diarrhea, abdominal pain, chest pain, shortness of breath, palpitations, focal weakness.  She endorses generalized weakness but states this has been ongoing for weeks and she has been following up with her PCP and neurology.  She endorses some nausea today as well.   ED course: On arrival to the ED, patient was hypertensive at 146/74 with heart rate of 99.  She was saturating at 94% on room air.  She was afebrile at 98.3.  Initial workup demonstrates VBG with pH of 7.47 with pCO2 of 47, platelets of 78, WBC of 4.8, creatinine of 0.80, and urinalysis with small hemoglobin, ketonuria, trace leukocytes, proteinuria and rare bacteria.  CT head was obtained that demonstrated no acute intracranial abnormalities.  Chest x-ray was obtained  that demonstrated cardiomegaly and moderate pulmonary vascular congestion.  Due to persistent memory complaints, TR[facility] contacted for admission for observation.  Review of Systems: As mentioned in the  history of present illness. All other systems reviewed and are negative. 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 Known DISH with thoracic spine fusion and moderate kyphosis, h/o L spine fusion L2-5   Adjustment disorder with depressed mood Oct 19, 2014   67 yo son died MI Sep 22, 2014 Husband died car accident 03-24-2015   Anemia    Anticoagulant long-term use 06/14/2019   She takes warfarin for afib/flutter with CHADS2VASc score of 4 (age, sex, CHF, HTN), s/p 2 bioprosthetic valve replacements Coumadin changed to eliquis 2022 per cardiology   Aortic atherosclerosis 03/03/2022   Noted on 01/2022 chest CT   Arthritis    Arthropathy of spinal facet joint 03/08/2018   Ascending aortic aneurysm 03/02/2021   4.1cm on CT 01/2021 rec yearly monitoring   Bilateral hip pain 09/08/2016   CAP (community acquired pneumonia) 02/08/2016   Carotid stenosis 12/08/2020   Carotid US - 40-59% BICA stenosis (2015) Carotid US 12/2020 - 1-39% BICA stenosis, incidental 3.4cm structure behind L common carotid artery rec further imaging    Cataracts, bilateral    immature   Chest pain 08/25/2014   myoview low risk 02/2018 ([place])   Chronic cholecystitis with calculus 01/21/2015   S/p cholecystectomy    Chronic diastolic heart failure 88/50/2774   Chronic insomnia    Chronic lower back pain 03/05/2008   Returned to Dr Ellene Route - translaminar ESI at L3/4. Myelogram showed significant left sided foraminal stenosis at L2/3 and L3/4 in setting of solid arthrodesis, planned DEXA and if stable osteopenia, considering anterolateral decompression with spacer.    CKD (chronic kidney disease) stage 2, GFR 60-89 ml/min 07/03/2015   Constipation    takes Miralax daily as needed   COPD (chronic obstructive  pulmonary disease)    Albuterol inhaler daily as needed;Duoneb daily as needed;Spiriva daily   DDD (degenerative disc disease)    cervical - kyphosis with mod DD changes C5/6 and C6/7; lumbar - early DD at L2/3 ([name])   DISH (diffuse idiopathic skeletal hyperostosis) 05/14/2019   By MRI noted fusion of lower thoracic vertebrae - likely contributing to unsteadiness ([name])   Dyspnea on exertion 06/05/2015   myoview low risk 02/2018   Essential hypertension, benign 03/24/2009   GERD (gastroesophageal reflux disease)    takes Protonix daily   Hematuria 06/17/2021   History of radiation therapy 05/30/11 to 07/07/11   rectum   History of rectal cancer 05/26/2011   Rectal cancer 2012, midrectum ypT3ypN0, s/p neoadj chemo&XRT, lap LAR with diverting loop ileostomy 08/2011, ileostomy takedown 03-23-2012 Discharged from oncology clinic 2017 Benay Spice)   History of shingles    History of vertebral fracture 11/26/2019   Old L1 vertebral compression fracture incidentally noted on imaging 2020    HLD (hyperlipidemia) 05/03/2007   Hyperlipemia    takes Lovastatin daily   Lacunar infarction    Left caudate   Left hip pain 07/29/2020   Legally blind in left eye    Lung nodule    RLL nodule-11m stable 2006, April 2009, and J2009/06/14  Lymphangioma 01/07/2021   Noted incidentally on carotid [place]/2022 CT neck - Likely benign lymphangioma 01/2021  Macrocytosis 03/27/2022   Folate and b12 checked 2023   Malaise and fatigue 01/22/2018   Meralgia paresthetica 12/13/2007   Mixed stress and urge urinary incontinence 03/16/2015   Failed oxybutynin IR/ER. Vesicare initially helped but then no longer helpful.  myrbetriq was not effective.  Saw urology (MacDiarmid) - UDS largely overactive bladder with mild-mod stress incontinence. Improved on abx course - maintained on [place].    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   Pulmonary infiltrates 04/23/2019   06/13/2018-CT super D chest without contrast- generally stable chronic lung disease with pleural parenchymal scarring and scattered nodularity most of these nodules have been tree-in-bud in appearance likely postinfectious or inflammatory the dominant right lower lobe nodule seen on most recent study has resolved no new or enlarging nodules  04/22/2019-CT chest without contrast- waxing and waning per   Pulmonary nodule 02/23/2012   RLL nodule-49m stable 2006, April 2009, and June 2009 Small pulm nodules Jan 2012 - > Oct 2012 without change   Rheumatic heart disease mitral stenosis    mod MS by echo 02/2014   Right leg swelling 06/11/2020   R venous [place]/2021 WNL: - No evidence of deep vein thrombosis in the lower extremity. No indirect evidence of obstruction proximal to the inguinal ligament. - No cystic structure found in the popliteal fossa.   S/P aortic and mitral valve bioprostheses 12/08/2013   AVR 21 mm, MVR 25 mm-MagnaEase pericardia   S/P AVR (aortic valve replacement) 2015   bioprosthetic (Bartle)   S/P MVR (mitral valve replacement) 2015    bioprosthetic (Bartle)   Sepsis secondary to UTI ([place] 08/22/2017   Severe aortic valve stenosis 10/01/2013   mild-mod by echo 02/2014   Skin lesion 01/03/2022   Small bowel obstruction, partial 08/2013   reolved without NGT placement.    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   Dr Amy JMartinique  Syncope 05/28/2018   Thrombocytopenia 11/04/2019   Typical atrial flutter 05/15/2019   Vitamin B12 deficiency 02/28/2015   Start B12 shots 02/2015    Vitamin D deficiency 10/23/2016   Past Surgical History:  Procedure Laterality Date   ANTERIOR LAT LUMBAR FUSION Left 07/17/2018   Procedure: Lumbar Two-Three Lumbar Three-Four Anterolateral decompression/interbody fusion with lateral plate fixation/Infuse;  Surgeon: EKristeen Miss MD;  Location: [facility]  Service: Neurosurgery;  Laterality: Left;  Lumbar Two-Three Lumbar Three-Four Anterolateral decompression/interbody fusion with lateral plate fixation/Infuse   AORTIC VALVE REPLACEMENT N/A 12/12/2013   Procedure: AORTIC VALVE REPLACEMENT (AVR);  Surgeon: BGaye Pollack MD; Service: Open Heart Surgery   BACK SURGERY  2006, 2007   2006 SPACER, 2007  decompression and fusion L4/5   BOWEL RESECTION  04/02/2012   Procedure: SMALL BOWEL RESECTION;  Surgeon: Stark Klein, MD;  Location: [facility];  Service: General;  Laterality: N/A;   BRONCHIAL WASHINGS  03/24/2021   Procedure: BRONCHIAL WASHINGS;  Surgeon: Brand Males, MD;  [facility]: [facility];  Service: Endoscopy;;   CARPAL TUNNEL RELEASE Bilateral    CATARACT EXTRACTION W/ INTRAOCULAR LENS IMPLANT Left 10/18/2016   CATARACT EXTRACTION W/ INTRAOCULAR LENS IMPLANT Right 12/13/2016   Dr. Kathrin Penner   CHOLECYSTECTOMY N/A 01/21/2015   chronic cholecystitis, Stark Klein, MD   COLON RESECTION  08/25/2011   Procedure: COLON RESECTION LAPAROSCOPIC;  Surgeon: Stark Klein, MD;  Location: [facility];  Service: General;  Laterality: N/A;  Laparoscopic Assisted Low Anterior Resection  Diverting Ostomy and onQ pain pump   COLONOSCOPY  03/2013   1 polyp, rpt 3 yrs Ardis Hughs)   COLONOSCOPY  05/2021   diverticulosis and patent colon anastomosis with some edema/narrowing, rpt 5 yrs Ardis Hughs)   COLONOSCOPY WITH PROPOFOL N/A 06/09/2016   patent colo-colonic anastomosis, rpt 5 yrs Ardis Hughs)   EP IMPLANTABLE DEVICE N/A 06/16/2016   Procedure: Pacemaker Implant;  Surgeon: Will Meredith Leeds, MD;  Location: [facility];  Service: Cardiovascular;  Laterality: N/A;   FOOT SURGERY  left foot   hammer toe   ILEOSTOMY  08/25/2011   ILEOSTOMY CLOSURE  04/02/2012   Procedure: ILEOSTOMY TAKEDOWN;  Surgeon: Stark Klein, MD;  [facility];  Service: General;  Laterality: N/A;   INTRAOPERATIVE TRANSESOPHAGEAL ECHOCARDIOGRAM N/A 12/12/2013   Procedure: INTRAOPERATIVE TRANSESOPHAGEAL ECHOCARDIOGRAM;  Surgeon: Gaye Pollack, MD;  Location: [facility];  Service: Open Heart Surgery;  Laterality: N/A;   LEFT AND RIGHT HEART CATHETERIZATION WITH CORONARY ANGIOGRAM N/A 11/27/2013   Procedure: LEFT AND RIGHT HEART CATHETERIZATION WITH CORONARY ANGIOGRAM;  Surgeon: Sanda Klein, MD;  Location: [facility];  Service: Cardiovascular;  Laterality: N/A;   MITRAL VALVE REPLACEMENT N/A 12/12/2013   Procedure: MITRAL VALVE (MV) REPLACEMENT OR REPAIR;  Surgeon: Gaye Pollack, MD; Service: Open Heart Surgery   TEE WITHOUT CARDIOVERSION N/A 11/29/2013   Procedure: TRANSESOPHAGEAL ECHOCARDIOGRAM (TEE);  Surgeon: Sueanne Margarita, MD;  Location: [facility];  Service: Cardiovascular;  Laterality: N/A;   TEE WITHOUT CARDIOVERSION N/A 06/04/2019   Procedure: TRANSESOPHAGEAL ECHOCARDIOGRAM (TEE);  Surgeon: Sanda Klein, MD;  Location: [facility];  Service: Cardiovascular;  Laterality: N/A;   TONSILLECTOMY     VIDEO BRONCHOSCOPY N/A 03/24/2021   Procedure: VIDEO BRONCHOSCOPY WITHOUT FLUORO;  Surgeon: Brand Males, MD;  [facility];  Service: Endoscopy;  Laterality: N/A;  SMALL  SCOPE,PREMEDICATE WITH NEBULIZED LIDOCAINE   Social History:  reports that she quit smoking about 47 years ago. Her smoking use included cigarettes. She has a 15.00 pack-year smoking history. She has never used smokeless tobacco. She reports that she does not currently use alcohol after a past usage of about 2.0 standard drinks of alcohol per week. She reports that she does not use drugs.  Allergies  Allergen Reactions   Codeine Nausea Only   Sulfa Antibiotics Nausea Only   Sulfonamide Derivatives Nausea Only    Family History  Problem Relation Age of Onset   Heart disease Mother    Diabetes Mother    Hypertension Mother    Stroke Mother    Lung cancer Father    Breast cancer Maternal Aunt 83   Heart attack Maternal Grandfather    CAD Son 81   Heart attack Son 35   Rectal  cancer Neg Hx    Stomach cancer Neg Hx    Esophageal cancer Neg Hx    Colon cancer Neg Hx     Prior to Admission medications   Medication Sig Start Date End Date Taking? Authorizing Provider  acetaminophen (TYLENOL) 500 MG tablet Take 2 tablets (1,000 mg total) by mouth daily. 05/02/19   Ria Bush, MD  albuterol (VENTOLIN HFA) 108 (90 Base) MCG/ACT inhaler Inhale 2 puffs into the lungs every 6 (six) hours as needed for wheezing. 11/24/21   Copland, Frederico Hamman, MD  apixaban (ELIQUIS) 5 MG TABS tablet TAKE 1 TABLET(5 MG) BY MOUTH TWICE DAILY 08/12/22   Croitoru, Mihai, MD  ascorbic acid (VITAMIN C) 500 MG tablet Take 1 tablet (500 mg total) by mouth daily. 10/05/22   Ria Bush, MD  Biotin 1000 MCG tablet Take 1,000 mcg by mouth daily.    [provider]  buPROPion ER (WELLBUTRIN SR) 100 MG 12 hr tablet Take 1 tablet (100 mg total) by mouth every morning. 10/05/22   Ria Bush, MD  calcium carbonate (OSCAL) 1500 (600 Ca) MG TABS tablet Take 600 mg of elemental calcium by mouth daily.    [provider]  carboxymethylcellulose (REFRESH PLUS) 0.5 % SOLN Place 1 drop into both eyes 3  (three) times daily as needed (dry eyes).    [provider]  cephALEXin (KEFLEX) 250 MG capsule Take 250 mg by mouth daily. 09/29/21   [provider]  Cholecalciferol (VITAMIN D) 50 MCG (2000 UT) tablet Take 2,000 Units by mouth daily.    [provider]  docusate sodium (COLACE) 100 MG capsule Take 200 mg by mouth every evening.    [provider]  furosemide (LASIX) 40 MG tablet Take 1 tablet (40 mg total) by mouth daily. May take an extra tablet daily as needed for swelling. 07/21/22   Croitoru, Mihai, MD  gabapentin (NEURONTIN) 300 MG capsule Take 300 mg by mouth at bedtime.    [provider]  ipratropium-albuterol (DUONEB) 0.5-2.5 (3) MG/3ML SOLN Take 3 mLs by nebulization every 6 (six) hours as needed. 11/14/14   Hendricks Limes, MD  lovastatin (MEVACOR) 40 MG tablet TAKE 1 TABLET(40 MG) BY MOUTH AT BEDTIME 07/13/22   Ria Bush, MD  montelukast (SINGULAIR) 10 MG tablet TAKE 1 TABLET(10 MG) BY MOUTH AT BEDTIME 08/12/22   Brand Males, MD  Multiple Vitamins-Minerals (PRESERVISION AREDS 2 PO) Take 2 capsules by mouth every evening.     [provider]  nitrofurantoin, macrocrystal-monohydrate, (MACROBID) 100 MG capsule Take 100 mg by mouth 2 (two) times daily. 05/30/22   [provider]  polyethylene glycol (MIRALAX / GLYCOLAX) packet Take 17 g by mouth daily. 12/02/13   Brett Canales, PA-C  potassium chloride (KLOR-CON) 10 MEQ tablet TAKE 2 TABLETS(20 MEQ) BY MOUTH DAILY 01/04/22   Croitoru, Dani Gobble, MD  silver sulfADIAZINE (SILVADENE) 1 % cream Apply 1 application topically daily. 12/21/20   Hyatt, Max T, DPM  traMADol (ULTRAM) 50 MG tablet TAKE 1 TABLET(50 MG) BY MOUTH TWICE DAILY AS NEEDED 09/22/22   Ria Bush, MD  TRELEGY ELLIPTA 200-62.5-25 MCG/ACT AEPB INHALE 1 PUFF INTO THE LUNGS DAILY 09/09/22   Brand Males, MD  Vibegron (GEMTESA) 75 MG TABS Take 75 mg by mouth daily.    [provider]  vitamin  B-12 (CYANOCOBALAMIN) 500 MCG tablet Take 1 tablet (500 mcg total) by mouth 2 (two) times a week. 01/06/22   Ria Bush, MD  Zinc 50 MG CAPS  Take 1 capsule (50 mg total) by mouth daily. 11/04/19   Ria Bush, MD    Physical Exam: Vitals:   10/09/22 1023  BP: (!) 146/74  Pulse: 99  Resp: 18  Temp: 98.3 F (36.8 C)  TempSrc: Oral  SpO2: 94%   Physical Exam Vitals and nursing note reviewed.  Constitutional:      General: She is not in acute distress.    Appearance: She is normal weight. She is not ill-appearing or toxic-appearing.  HENT:     Head: Normocephalic and atraumatic.     Mouth/Throat:     Mouth: Mucous membranes are moist.     Pharynx: Oropharynx is clear.  Eyes:     General: No scleral icterus.    Extraocular Movements: Extraocular movements intact.     Conjunctiva/sclera: Conjunctivae normal.     Pupils: Pupils are equal, round, and reactive to light.  Cardiovascular:     Rate and Rhythm: Normal rate and regular rhythm.     Heart sounds: Murmur heard.  Pulmonary:     Effort: Pulmonary effort is normal. No respiratory distress.     Breath sounds: Rales (Minimal bibasilar Rales) present. No wheezing or rhonchi.  Abdominal:     General: Bowel sounds are normal. There is no distension.     Palpations: Abdomen is soft.     Tenderness: There is no abdominal tenderness. There is no guarding.  Musculoskeletal:     Cervical back: Neck supple.     Right lower leg: No edema.     Left lower leg: No edema.  Skin:    General: Skin is warm and dry.  Neurological:     General: No focal deficit present.     Mental Status: She is alert.     Comments: Patient alert and oriented to person, place, time and situation 5 out of 5 strength in all 4 extremities Sensation intact throughout Normal heel-to-shin bilaterally Normal finger-to-nose bilaterally No dysarthria or facial asymmetry.  Psychiatric:        Mood and Affect: Mood normal.        Behavior: Behavior  normal.        Thought Content: Thought content normal.        Judgment: Judgment normal.    Data Reviewed:  CBC with WBC of 4.8, hemoglobin of 12.4, MCV of 104, platelets of 78. BMP with potassium of 3.9, bicarb of 29, glucose of 139, creatinine 0.80, GFR over 60. VBG with pH of 7.47, pCO2 of 47, pO2 of 63. Urinalysis with amber color, small hemoglobin, ketonuria, trace leukocytes, proteinuria, rare bacteria, and over 50 RBC and 11-20 WBC.   Echocardiogram obtained on 12/28 reviewed that demonstrates EF of 60-65% with normal function.  Moderate LVH.  RV systolic function mildly reduced with moderately elevated PASP.  No significant change compared to prior TTE obtained on 10/2021.    CT L-spine obtained on 12/29 demonstrates interval decompression of the L2-3 and L3-4 central canal after fusion, and otherwise chronic only findings.   CT HEAD WO CONTRAST   Result Date: 10/09/2022 CLINICAL DATA:  Memory loss. EXAM: CT HEAD WITHOUT CONTRAST TECHNIQUE: Contiguous axial images were obtained from the base of the skull through the vertex without intravenous contrast. RADIATION DOSE REDUCTION: This exam was performed according to the departmental dose-optimization program which includes automated exposure control, adjustment of the mA and/or kV according to patient size and/or use of iterative reconstruction technique. COMPARISON:  CT scan of the brain June 18, 2021 FINDINGS:  Brain: No evidence of acute infarction, hemorrhage, hydrocephalus, extra-axial collection or mass lesion/mass effect. Vascular: No hyperdense vessel or unexpected calcification. Skull: Normal. Negative for fracture or focal lesion. Sinuses/Orbits: No acute finding. Other: None. IMPRESSION: No acute intracranial abnormalities. Electronically Signed   By: Dorise Bullion III [name]   On: 10/09/2022 11:54    DG Chest 2 View   Result Date: 10/09/2022 CLINICAL DATA:  Altered mental status and cough.  Confusion. EXAM: CHEST - 2 VIEW  COMPARISON:  One-view chest x-ray 03/10/20 FINDINGS: Heart is enlarged. Pacing wires and valve replacements again noted. Increased density present at the right costophrenic angle. Patchy opacities are present at the left base. Moderate pulmonary vascular congestion is present. Areas of pleural thickening are present on the right scratched at areas of pleural thickening are present bilaterally. No definite effusions are present. Visualized soft tissues and bony thorax are unremarkable. IMPRESSION: 1. Cardiomegaly and moderate pulmonary vascular congestion without frank edema. Probable small effusions superimposed on chronic pleural thickening. Findings are concerning for early congestive heart failure. 2. Increased density at the right costophrenic angle likely reflects atelectasis. Infection is not excluded. Electronically Signed   By: San Morelle [name]   On: 10/09/2022 11:41     There are no new results to review at this time.  Family Communication: Patient's son updated at bedside.  Primary team communication: Dr. Jori Moll updated on plan.  Thank you very much for involving [place] in the care of your patient.  Author: Jose Persia, MD 10/09/2022 1:40 PM  For on call review [email or web address].

## 2022-10-09 NOTE — ED Notes (Signed)
EDP at bedside  

## 2022-10-10 LAB — URINE CULTURE
Culture: NO GROWTH
Culture: NO GROWTH

## 2022-10-11 ENCOUNTER — Telehealth: Payer: Self-pay

## 2022-10-11 NOTE — Telephone Encounter (Signed)
Transition Care Management Follow-up Telephone Call Date of discharge and from where: [facility] 10/09/2022 How have you been since you were released from the hospital? better Any questions or concerns? No  Items Reviewed: Did the pt receive and understand the discharge instructions provided? Yes  Medications obtained and verified? Yes  Other? No  Any new allergies since your discharge? No  Dietary orders reviewed? Yes Do you have support at home? Yes   Home Care and Equipment/Supplies: Were home health services ordered? no If so, what is the name of the agency? N/a  Has the agency set up a time to come to the patient's home? not applicable Were any new equipment or medical supplies ordered?  No What is the name of the medical supply agency? N/a Were you able to get the supplies/equipment? not applicable Do you have any questions related to the use of the equipment or supplies? No  Functional Questionnaire: (I = Independent and D = Dependent) ADLs: I  Bathing/Dressing- I  Meal Prep- I  Eating- I  Maintaining continence- I  Transferring/Ambulation- I  Managing Meds- I  Follow up appointments reviewed:  PCP Hospital f/u appt confirmed? Yes  Scheduled to see [facility] on 10/12/2022 @ 4:00. [facility] f/u appt confirmed? No  Are transportation arrangements needed? No  If their condition worsens, is the pt aware to call PCP or go to the Emergency Dept.? Yes Was the patient provided with contact information for the PCP's office or ED? Yes Was to pt encouraged to call back with questions or concerns? Yes Juanda Crumble, LPN [facility] Direct Dial [phone]

## 2022-10-12 ENCOUNTER — Ambulatory Visit (INDEPENDENT_AMBULATORY_CARE_PROVIDER_SITE_OTHER): Payer: Medicare Other | Admitting: Family Medicine

## 2022-10-12 ENCOUNTER — Encounter: Payer: Self-pay | Admitting: Family Medicine

## 2022-10-12 VITALS — BP 141/80 | HR 83 | Temp 97.6°F | Ht 62.0 in | Wt 144.1 lb

## 2022-10-12 DIAGNOSIS — I10 Essential (primary) hypertension: Secondary | ICD-10-CM

## 2022-10-12 DIAGNOSIS — R410 Disorientation, unspecified: Secondary | ICD-10-CM | POA: Diagnosis not present

## 2022-10-12 DIAGNOSIS — N182 Chronic kidney disease, stage 2 (mild): Secondary | ICD-10-CM | POA: Diagnosis not present

## 2022-10-12 DIAGNOSIS — N3001 Acute cystitis with hematuria: Secondary | ICD-10-CM | POA: Diagnosis not present

## 2022-10-12 DIAGNOSIS — D696 Thrombocytopenia, unspecified: Secondary | ICD-10-CM | POA: Diagnosis not present

## 2022-10-12 NOTE — Assessment & Plan Note (Signed)
Stable with ER labs

## 2022-10-12 NOTE — Assessment & Plan Note (Signed)
Platelet ct 78  Followed by pcp No clotting or change in bleeding tendency per pt

## 2022-10-12 NOTE — Assessment & Plan Note (Signed)
Seen in ER 12/31 for acute confusional episode  Reassuring w/u  Reviewed hospital records, lab results and studies in detail   Ucx came back neg after tx with rocephin and keflex for poss uti  Cxr however noted inc density over R CP angle-? Perhaps was early pna  Improved with keflex  Back to baseline  Warrants close follow up -will notify pcp  ER precautions noted Will watch for new symptoms

## 2022-10-12 NOTE — Patient Instructions (Signed)
Keep eye on your blood pressure   Stay hydrated   Finish the keflex  I'm not sure what caused your episode  Urine culture is negative  Perhaps a very early lung infection   Glad you feel better   Let us know if anything changes  I will check in with Dr Darnell Level

## 2022-10-12 NOTE — Assessment & Plan Note (Signed)
She was tx in ER for uti but neg cx  Feeling better  No more confusion

## 2022-10-12 NOTE — Assessment & Plan Note (Signed)
bp in fair control at this time  Better on 2nd checn and at home BP Readings from Last 1 Encounters:  10/12/22 (!) 141/80   No changes needed Most recent labs reviewed  Disc lifstyle change with low sodium diet and exercise  Continues lasix 40 mg daily and cardiology care

## 2022-10-12 NOTE — Progress Notes (Signed)
Subjective:    Patient ID: [name], female    DOB: Sep 14, 1946, 77 y.o.   MRN: [id number]  HPI 77 yo pt of Dr Darnell Level presents for f/u of ER visit for uti   Wt Readings from Last 3 Encounters:  10/12/22 144 lb 2 oz (65.4 kg)  10/05/22 149 lb 9.6 oz (67.9 kg)  09/12/22 148 lb 12.8 oz (67.5 kg)   26.36 kg/m  Vitals:   10/12/22 77 y.o.  BP: (!) 156/82  Pulse: 83  Temp: 97.6 F (36.4 C)  TempSrc: Temporal  SpO2: 93%  Weight: 144 lb 2 oz (65.4 kg)  Height: _0  (1.575 m)    She was seen in ER on 10/09/22 for weakness and ms change  Noted confusion  A friend called her and took her to the ER  She does not remember all of it   Incontinence Abd ua (tr of wbc an rbc) on eliquis  Cx pending Tx for uti  with rocephin in the ER and sent home with keflex  Urine returned with no growth  Lab Results  Component Value Date   CREATININE 0.80 10/09/2022   BUN 14 10/09/2022   NA 139 10/09/2022   K 3.9 10/09/2022   CL 100 10/09/2022   CO2 29 10/09/2022   Lab Results  Component Value Date   WBC 4.8 10/09/2022   HGB 12.4 10/09/2022   HCT 40.1 10/09/2022   MCV 104.2 (H) 10/09/2022   PLT 78 (L) 10/09/2022    Neg resp profile flu/covid and rsv  CT head reassuring CT HEAD WO CONTRAST (Accession [id number]) (Order [id number]) Imaging Date: 10/09/2022 Department: [place] Released By: Candie Echevaria, RN (auto-released) Authorizing: Arta Silence, MD   Exam Status  Status  Final [99]   PACS Intelerad Image Link   Show images for CT HEAD WO CONTRAST Study Result  Narrative & Impression  CLINICAL DATA:  Memory loss.   EXAM: CT HEAD WITHOUT CONTRAST   TECHNIQUE: Contiguous axial images were obtained from the base of the skull through the vertex without intravenous contrast.   RADIATION DOSE REDUCTION: This exam was performed according to the departmental dose-optimization program which includes  automated exposure control, adjustment of the mA and/or kV according to patient size and/or use of iterative reconstruction technique.   COMPARISON:  CT scan of the brain June 18, 2021   FINDINGS: Brain: No evidence of acute infarction, hemorrhage, hydrocephalus, extra-axial collection or mass lesion/mass effect.   Vascular: No hyperdense vessel or unexpected calcification.   Skull: Normal. Negative for fracture or focal lesion.   Sinuses/Orbits: No acute finding.   Other: None.   IMPRESSION: No acute intracranial abnormalities.     H/o low b12 Lab Results  Component Value Date   VITAMINB12 535 06/22/2022    H/o low platelet ct   HTN BP Readings from Last 3 Encounters:  10/12/22 (!) 141/80  10/09/22 (!) 146/74  10/05/22 124/70    Takes lasix   Drinks a lot of fluids as a rule   Narrative & Impression  CLINICAL DATA:  Altered mental status and cough.  Confusion.   EXAM: CHEST - 2 VIEW   COMPARISON:  One-view chest x-ray 03/10/20   FINDINGS: Heart is enlarged. Pacing wires and valve replacements again noted. Increased density present at the right costophrenic angle. Patchy opacities are present at the left base. Moderate pulmonary vascular congestion is present. Areas of pleural thickening are present on the right scratched  at areas of pleural thickening are present bilaterally. No definite effusions are present. Visualized soft tissues and bony thorax are unremarkable.   IMPRESSION: 1. Cardiomegaly and moderate pulmonary vascular congestion without frank edema. Probable small effusions superimposed on chronic pleural thickening. Findings are concerning for early congestive heart failure. 2. Increased density at the right costophrenic angle likely reflects atelectasis. Infection is not excluded.   Feels better now  Was nauseated on Monday-that is better now  Appetite is better yesterday and today   She has h/o chronic diastolic HF  Sees urology  for incontinence   Takes low dose keflex daily to prevent uti   Patient Active Problem List   Diagnosis Date Noted   Episode of confusion 10/12/2022   Altered mental status 10/09/2022   Fatigue 10/05/2022   Positive ANA (antinuclear antibody) 05/30/2022   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   Lacunar infarction ([place])    Macrocytosis 03/27/2022   Aortic atherosclerosis 03/03/2022   Peripheral neuropathy 01/03/2022   Leg ulcer ([place]) 06/28/2021   Hematuria 06/17/2021   Pulmonary artery hypertension 06/17/2021   Pacemaker 03/02/2021   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   Ascending aortic aneurysm ([place]) 03/02/2021   Lymphangioma 01/07/2021   Persistent atrial fibrillation 12/09/2020   Carotid stenosis 12/08/2020   Left hip pain 07/29/2020   Right leg swelling 06/11/2020   History of vertebral fracture 11/26/2019   Acute right-sided low back pain without sciatica 11/26/2019   Thrombocytopenia 11/04/2019   Anticoagulant long-term use 06/14/2019   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   Typical atrial flutter 05/15/2019   DISH (diffuse idiopathic skeletal hyperostosis) 05/14/2019   Pulmonary infiltrates 04/23/2019   Other spondylosis with radiculopathy, lumbar region 07/17/2018   Syncope 05/28/2018   Arthropathy of spinal facet joint 03/08/2018   Malaise and fatigue 01/22/2018   Pain of right sacroiliac joint 08/09/2017   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 12/31/2016   Vitamin D deficiency 10/23/2016   Bilateral hip pain 09/08/2016   Personal history of colonic polyps    Advanced care planning/counseling discussion 10/28/2015   CKD (chronic kidney disease) stage 2, GFR 60-89 ml/min 07/03/2015   Dyspnea on exertion 06/05/2015   Mixed stress and urge urinary incontinence 03/16/2015   Vitamin B12 deficiency 02/28/2015   Chronic insomnia 02/25/2015   Osteopenia 12/09/2014   Medicare annual wellness visit, subsequent 11/01/14   Health maintenance examination  November 01, 2014   Adjustment disorder with mixed anxiety and depressed mood 11/01/2014   Chronic diastolic heart failure 76/72/0947   Chest pain 08/25/2014   S/P aortic and mitral valve bioprostheses 12/2013   UTI (urinary tract infection) 11/27/2013   Pulmonary nodules 02/23/2012   History of radiation therapy    History of rectal cancer 05/26/2011   Essential hypertension, benign 03/24/2009   Overweight (BMI 25.0-29.9) 03/05/2008   Chronic lower back pain 03/05/2008   Meralgia paresthetica 12/13/2007   GERD (gastroesophageal reflux disease) 08/06/2007   HLD (hyperlipidemia) 05/03/2007   COPD (chronic obstructive pulmonary disease) 05/03/2007   Past Medical History:  Diagnosis Date   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 Known DISH with thoracic spine fusion and moderate kyphosis, h/o L spine fusion L2-5   Adjustment disorder with depressed mood 01-Nov-2014   50 yo son died MI 05-Oct-2014 Husband died car accident 04-06-15   Anemia    Anticoagulant long-term use  06/14/2019   She takes warfarin for afib/flutter with CHADS2VASc score of 4 (age, sex, CHF, HTN), s/p 2 bioprosthetic valve replacements Coumadin changed to eliquis 2022 per cardiology   Aortic atherosclerosis 03/03/2022   Noted on 01/2022 chest CT   Arthritis    Arthropathy of spinal facet joint 03/08/2018   Ascending aortic aneurysm 03/02/2021   4.1cm on CT 01/2021 rec yearly monitoring   Bilateral hip pain 09/08/2016   CAP (community acquired pneumonia) 02/08/2016   Carotid stenosis 12/08/2020   Carotid US - 40-59% BICA stenosis (2015) Carotid US 12/2020 - 1-39% BICA stenosis, incidental 3.4cm structure behind L common carotid artery rec further imaging    Cataracts, bilateral    immature   Chest pain 08/25/2014   myoview low risk 02/2018  ([place])   Chronic cholecystitis with calculus 01/21/2015   S/p cholecystectomy    Chronic diastolic heart failure 56/97/9480   Chronic insomnia    Chronic lower back pain 03/05/2008   Returned to Dr Ellene Route - translaminar ESI at L3/4. Myelogram showed significant left sided foraminal stenosis at L2/3 and L3/4 in setting of solid arthrodesis, planned DEXA and if stable osteopenia, considering anterolateral decompression with spacer.    CKD (chronic kidney disease) stage 2, GFR 60-89 ml/min 07/03/2015   Constipation    takes Miralax daily as needed   COPD (chronic obstructive pulmonary disease)    Albuterol inhaler daily as needed;Duoneb daily as needed;Spiriva daily   DDD (degenerative disc disease)    cervical - kyphosis with mod DD changes C5/6 and C6/7; lumbar - early DD at L2/3 ([facility]lsner)   DISH (diffuse idiopathic skeletal hyperostosis) 05/14/2019   By MRI noted fusion of lower thoracic vertebrae - likely contributing to unsteadiness (Elsner)   Dyspnea on exertion 06/05/2015   myoview low risk 02/2018   Essential hypertension, benign 03/24/2009   GERD (gastroesophageal reflux disease)    takes Protonix daily   Hematuria 06/17/2021   History of radiation therapy 05/30/11 to 07/07/11   rectum   History of rectal cancer 05/26/2011   Rectal cancer 2012, midrectum ypT3ypN0, s/p neoadj chemo&XRT, lap LAR with diverting loop ileostomy 08/2011, ileostomy takedown 03/2012 Discharged from oncology clinic 2017 Benay Spice)   History of shingles    History of vertebral fracture 11/26/2019   Old L1 vertebral compression fracture incidentally noted on imaging 2020    HLD (hyperlipidemia) 05/03/2007   Hyperlipemia    takes Lovastatin daily   Lacunar infarction    Left caudate   Left hip pain 07/29/2020   Legally blind in left eye    Lung nodule    RLL nodule-46m stable 2006, April 2009, and June 2009   Lymphangioma 01/07/2021   Noted incidentally on carotid [place]/2022 CT neck - Likely benign  lymphangioma 01/2021   Macrocytosis 03/27/2022   Folate and b12 checked 2023   Malaise and fatigue 01/22/2018   Meralgia paresthetica 12/13/2007   Mixed stress and urge urinary incontinence 03/16/2015   Failed oxybutynin IR/ER. Vesicare initially helped but then no longer helpful.  myrbetriq was not effective.  Saw urology (MacDiarmid) - UDS largely overactive bladder with mild-mod stress incontinence. 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   Pulmonary infiltrates 04/23/2019   06/13/2018-CT super D chest without contrast- generally stable chronic lung disease with pleural parenchymal scarring and scattered nodularity most of these nodules have been tree-in-bud in appearance likely postinfectious or inflammatory the dominant right lower lobe nodule seen on most recent study has resolved no new or enlarging nodules  04/22/2019-CT chest without contrast- waxing and waning per   Pulmonary nodule 02/23/2012   RLL nodule-8m stable 2006, April 2009, and  June 2009 Small pulm nodules Jan 2012 - > Oct 2012 without change   Rheumatic heart disease mitral stenosis    mod MS by echo 02/2014   Right leg swelling 06/11/2020   R venous [place]9/2021 WNL: - No evidence of deep vein thrombosis in the lower extremity. No indirect evidence of obstruction proximal to the inguinal ligament. - No cystic structure found in the popliteal fossa.   S/P aortic and mitral valve bioprostheses 12/08/2013   AVR 21 mm, MVR 25 mm-MagnaEase pericardia   S/P AVR (aortic valve replacement) 2015   bioprosthetic ([place])   S/P MVR (mitral valve replacement) 2015   bioprosthetic ([place])   Sepsis secondary to UTI ([place] 08/22/2017   Severe aortic valve stenosis 10/01/2013   mild-mod by echo 02/2014   Skin lesion 01/03/2022   Small bowel obstruction, partial 08/2013   reolved without NGT placement.    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   Dr Amy JMartinique  Syncope 05/28/2018   Thrombocytopenia 11/04/2019   Typical atrial flutter 05/15/2019   Vitamin B12 deficiency 02/28/2015   Start B12 shots 02/2015    Vitamin D deficiency 10/23/2016   Past Surgical History:  Procedure Laterality Date   ANTERIOR LAT LUMBAR FUSION Left 07/17/2018   Procedure: Lumbar Two-Three Lumbar Three-Four Anterolateral decompression/interbody fusion with lateral plate fixation/Infuse;  Surgeon: EKristeen Miss MD;  [facility]  Service: Neurosurgery;  Laterality: Left;  Lumbar Two-Three Lumbar Three-Four Anterolateral decompression/interbody fusion with lateral plate fixation/Infuse   AORTIC VALVE REPLACEMENT N/A 12/12/2013   Procedure: AORTIC VALVE REPLACEMENT (AVR);  Surgeon: BGaye Pollack MD; Service: Open Heart Surgery   BACK SURGERY  2006, 2007   2006 SPACER, 2007 decompression and fusion L4/5   BOWEL RESECTION  04/02/2012   Procedure: SMALL BOWEL RESECTION;  Surgeon: FStark Klein MD;  Location: [facility];  Service: General;  Laterality: N/A;   BRONCHIAL WASHINGS   03/24/2021   Procedure: BRONCHIAL WASHINGS;  Surgeon: RBrand Males MD;  Location: [facility];  Service: Endoscopy;;   CARPAL TUNNEL RELEASE Bilateral    CATARACT EXTRACTION W/ INTRAOCULAR LENS IMPLANT Left 10/18/2016   CATARACT EXTRACTION W/ INTRAOCULAR LENS IMPLANT Right 12/13/2016   Dr. SKathrin Penner  CHOLECYSTECTOMY N/A 01/21/2015   chronic cholecystitis, FStark Klein MD   COLON RESECTION  08/25/2011   Procedure: COLON RESECTION LAPAROSCOPIC;  Surgeon: FStark Klein MD;  Location: [facility];  Service: General;  Laterality: N/A;  Laparoscopic Assisted Low Anterior Resection Diverting Ostomy and onQ pain pump   COLONOSCOPY  03/2013   1 polyp, rpt 3 yrs Ardis Hughs)   COLONOSCOPY  05/2021   diverticulosis and patent colon anastomosis with some edema/narrowing, rpt 5 yrs Ardis Hughs)   COLONOSCOPY WITH PROPOFOL N/A 06/09/2016   patent colo-colonic anastomosis, rpt 5 yrs Ardis Hughs)   EP IMPLANTABLE DEVICE N/A 06/16/2016   Procedure: Pacemaker Implant;  Surgeon: Will Meredith Leeds, MD;  Location: [facility];  Service: Cardiovascular;  Laterality: N/A;   FOOT SURGERY  left foot   hammer toe   ILEOSTOMY  08/25/2011   ILEOSTOMY CLOSURE  04/02/2012   Procedure: ILEOSTOMY TAKEDOWN;  Surgeon: Stark Klein, MD;  Location: [facility];  Service: General;  Laterality: N/A;   INTRAOPERATIVE TRANSESOPHAGEAL ECHOCARDIOGRAM N/A 12/12/2013   Procedure: INTRAOPERATIVE TRANSESOPHAGEAL ECHOCARDIOGRAM;  Surgeon: Gaye Pollack, MD;  Location: [facility];  Service: Open Heart Surgery;  Laterality: N/A;   LEFT AND RIGHT HEART CATHETERIZATION WITH CORONARY ANGIOGRAM N/A 11/27/2013   Procedure: LEFT AND RIGHT HEART CATHETERIZATION WITH CORONARY ANGIOGRAM;  Surgeon: Sanda Klein, MD;  Location: [facility];  Service: Cardiovascular;  Laterality: N/A;   MITRAL VALVE REPLACEMENT N/A 12/12/2013   Procedure: MITRAL VALVE (MV) REPLACEMENT OR REPAIR;  Surgeon: Gaye Pollack, MD; Service: Open Heart Surgery   TEE WITHOUT  CARDIOVERSION N/A 11/29/2013   Procedure: TRANSESOPHAGEAL ECHOCARDIOGRAM (TEE);  Surgeon: Sueanne Margarita, MD;  [facility];  Service: Cardiovascular;  Laterality: N/A;   TEE WITHOUT CARDIOVERSION N/A 06/04/2019   Procedure: TRANSESOPHAGEAL ECHOCARDIOGRAM (TEE);  Surgeon: Sanda Klein, MD;  Location: [facility];  Service: Cardiovascular;  Laterality: N/A;   TONSILLECTOMY     VIDEO BRONCHOSCOPY N/A 03/24/2021   Procedure: VIDEO BRONCHOSCOPY WITHOUT FLUORO;  Surgeon: Brand Males, MD;  Location[facility];  Service: Endoscopy;  Laterality: N/A;  SMALL SCOPE,PREMEDICATE WITH NEBULIZED LIDOCAINE   Social History   Tobacco Use   Smoking status: Former    Packs/day: 1.50    Years: 10.00    Total pack years: 15.00    Types: Cigarettes    Quit date: 12/28/1974    Years since quitting: 47.8   Smokeless tobacco: Never   Tobacco comments:    quit smoking in 1976  Vaping Use   Vaping Use: Never used  Substance Use Topics   Alcohol use: Not Currently    Alcohol/week: 2.0 standard drinks of alcohol    Types: 2 Glasses of wine per week    Comment: very rare   Drug use: No   Family History  Problem Relation Age of Onset   Heart disease Mother    Diabetes Mother    Hypertension Mother    Stroke Mother    Lung cancer Father    Breast cancer Maternal Aunt 83   Heart attack Maternal Grandfather    CAD Son 43   Heart attack Son 60   Rectal cancer Neg Hx    Stomach cancer Neg Hx    Esophageal cancer Neg Hx    Colon cancer Neg Hx    Allergies  Allergen Reactions   Codeine Nausea Only   Sulfa Antibiotics Nausea Only   Sulfonamide Derivatives Nausea Only   Current Outpatient Medications on File Prior to Visit  Medication Sig Dispense Refill   acetaminophen (TYLENOL) 500 MG tablet Take 2 tablets (1,000 mg total) by mouth daily.     albuterol (VENTOLIN HFA) 108 (90 Base) MCG/ACT inhaler Inhale 2 puffs into the lungs every  6 (six) hours as needed for wheezing. 18 g 6    apixaban (ELIQUIS) 5 MG TABS tablet TAKE 1 TABLET(5 MG) BY MOUTH TWICE DAILY 60 tablet 5   ascorbic acid (VITAMIN C) 500 MG tablet Take 1 tablet (500 mg total) by mouth daily.     Biotin 1000 MCG tablet Take 1,000 mcg by mouth daily.     buPROPion ER (WELLBUTRIN SR) 100 MG 12 hr tablet Take 1 tablet (100 mg total) by mouth every morning. 30 tablet 3   calcium carbonate (OSCAL) 1500 (600 Ca) MG TABS tablet Take 600 mg of elemental calcium by mouth daily.     carboxymethylcellulose (REFRESH PLUS) 0.5 % SOLN Place 1 drop into both eyes 3 (three) times daily as needed (dry eyes).     cephALEXin (KEFLEX) 500 MG capsule Take 1 capsule (500 mg total) by mouth 4 (four) times daily for 5 days. 20 capsule 0   Cholecalciferol (VITAMIN D) 50 MCG (2000 UT) tablet Take 2,000 Units by mouth daily.     docusate sodium (COLACE) 100 MG capsule Take 200 mg by mouth every evening.     furosemide (LASIX) 40 MG tablet Take 1 tablet (40 mg total) by mouth daily. May take an extra tablet daily as needed for swelling. 135 tablet 2   gabapentin (NEURONTIN) 300 MG capsule Take 300 mg by mouth at bedtime.     ipratropium-albuterol (DUONEB) 0.5-2.5 (3) MG/3ML SOLN Take 3 mLs by nebulization every 6 (six) hours as needed. 360 mL 0   lovastatin (MEVACOR) 40 MG tablet TAKE 1 TABLET(40 MG) BY MOUTH AT BEDTIME 90 tablet 1   montelukast (SINGULAIR) 10 MG tablet TAKE 1 TABLET(10 MG) BY MOUTH AT BEDTIME 90 tablet 1   Multiple Vitamins-Minerals (PRESERVISION AREDS 2 PO) Take 2 capsules by mouth every evening.      polyethylene glycol (MIRALAX / GLYCOLAX) packet Take 17 g by mouth daily. 14 each 0   potassium chloride (KLOR-CON) 10 MEQ tablet TAKE 2 TABLETS(20 MEQ) BY MOUTH DAILY 180 tablet 3   silver sulfADIAZINE (SILVADENE) 1 % cream Apply 1 application topically daily. 50 g 1   traMADol (ULTRAM) 50 MG tablet TAKE 1 TABLET(50 MG) BY MOUTH TWICE DAILY AS NEEDED 40 tablet 0   TRELEGY ELLIPTA 200-62.5-25 MCG/ACT AEPB INHALE 1 PUFF  INTO THE LUNGS DAILY 60 each 11   Vibegron (GEMTESA) 75 MG TABS Take 75 mg by mouth daily.     vitamin B-12 (CYANOCOBALAMIN) 500 MCG tablet Take 1 tablet (500 mcg total) by mouth 2 (two) times a week.     Zinc 50 MG CAPS Take 1 capsule (50 mg total) by mouth daily.  0   Current Facility-Administered Medications on File Prior to Visit  Medication Dose Route Frequency Provider Last Rate Last Admin   ondansetron (ZOFRAN) 4 mg in sodium chloride 0.9 % 50 mL IVPB  4 mg Intravenous Q6H PRN Kristeen Miss, MD        Review of Systems  Constitutional:  Positive for fatigue. Negative for activity change, appetite change, fever and unexpected weight change.  HENT:  Negative for congestion, ear pain, rhinorrhea, sinus pressure and sore throat.   Eyes:  Negative for pain, redness and visual disturbance.  Respiratory:  Negative for cough, shortness of breath and wheezing.   Cardiovascular:  Negative for chest pain and palpitations.  Gastrointestinal:  Negative for abdominal pain, blood in stool, constipation and diarrhea.  Endocrine: Negative for polydipsia and polyuria.  Genitourinary:  Negative for  dysuria, frequency and urgency.  Musculoskeletal:  Negative for arthralgias, back pain and myalgias.  Skin:  Negative for pallor and rash.  Allergic/Immunologic: Negative for environmental allergies.  Neurological:  Negative for dizziness, syncope and headaches.       Confusion is resolved   Hematological:  Negative for adenopathy. Does not bruise/bleed easily.  Psychiatric/Behavioral:  Negative for decreased concentration and dysphoric mood. The patient is not nervous/anxious.        Objective:   Physical Exam Constitutional:      General: She is not in acute distress.    Appearance: Normal appearance. She is well-developed and normal weight. She is not ill-appearing or diaphoretic.  HENT:     Head: Normocephalic and atraumatic.  Eyes:     General:        Right eye: No discharge.        Left  eye: No discharge.     Conjunctiva/sclera: Conjunctivae normal.     Pupils: Pupils are equal, round, and reactive to light.  Neck:     Thyroid: No thyromegaly.     Vascular: No carotid bruit or JVD.  Cardiovascular:     Rate and Rhythm: Normal rate and regular rhythm.     Heart sounds: Normal heart sounds.     No gallop.  Pulmonary:     Effort: Pulmonary effort is normal. No respiratory distress.     Breath sounds: Normal breath sounds. No wheezing or rales.     Comments: Diffusely distant bs  Slt diminished bs at bases  No rales/rhonchi or wheeze  Abdominal:     General: There is no distension or abdominal bruit.     Palpations: Abdomen is soft. There is no mass.     Tenderness: There is no abdominal tenderness. There is no right CVA tenderness or left CVA tenderness.     Comments: No suprapubic tenderness or fullness    Musculoskeletal:     Cervical back: Normal range of motion and neck supple.     Right lower leg: No edema.     Left lower leg: No edema.  Lymphadenopathy:     Cervical: No cervical adenopathy.  Skin:    General: Skin is warm and dry.     Coloration: Skin is not jaundiced or pale.     Findings: No bruising, erythema or rash.  Neurological:     Mental Status: She is alert.     Coordination: Coordination normal.     Deep Tendon Reflexes: Reflexes are normal and symmetric. Reflexes normal.  Psychiatric:        Attention and Perception: Attention normal.        Mood and Affect: Mood normal.        Speech: Speech normal.        Behavior: Behavior normal.        Cognition and Memory: Cognition and memory normal.     Comments: Pleasant Mentally sharp           Assessment & Plan:   Problem List Items Addressed This Visit       Cardiovascular and Mediastinum   Essential hypertension, benign    bp in fair control at this time  Better on 2nd checn and at home BP Readings from Last 1 Encounters:  10/12/22 (!) 141/80  No changes needed Most recent  labs reviewed  Disc lifstyle change with low sodium diet and exercise  Continues lasix 40 mg daily and cardiology care  Genitourinary   CKD (chronic kidney disease) stage 2, GFR 60-89 ml/min    Stable with ER labs       UTI (urinary tract infection)    She was tx in ER for uti but neg cx  Feeling better  No more confusion         Hematopoietic and Hemostatic   Thrombocytopenia    Platelet ct 78  Followed by pcp No clotting or change in bleeding tendency per pt         Other   Episode of confusion - Primary    Seen in ER 12/31 for acute confusional episode  Reassuring w/u  Reviewed hospital records, lab results and studies in detail   Ucx came back neg after tx with rocephin and keflex for poss uti  Cxr however noted inc density over R CP angle-? Perhaps was early pna  Improved with keflex  Back to baseline  Warrants close follow up -will notify pcp  ER precautions noted Will watch for new symptoms

## 2022-10-13 ENCOUNTER — Ambulatory Visit: Payer: Medicare Other | Admitting: Clinical

## 2022-10-13 NOTE — Telephone Encounter (Signed)
Amoxicillin Last filled: 03/15/22, #4 Last OV: 10/05/22, fatigue Next OV: 11/11/22, f/u

## 2022-10-14 NOTE — Telephone Encounter (Signed)
ERx 

## 2022-10-19 ENCOUNTER — Ambulatory Visit (INDEPENDENT_AMBULATORY_CARE_PROVIDER_SITE_OTHER): Payer: Medicare Other | Admitting: Clinical

## 2022-10-19 DIAGNOSIS — F4323 Adjustment disorder with mixed anxiety and depressed mood: Secondary | ICD-10-CM

## 2022-10-19 NOTE — Progress Notes (Signed)
  [place] Counselor/Therapist Progress Note  Patient ID: [name], MRN: [id number]    Date: 10/19/22  Time Spent: 8:33  am - 9:22 am : 49 Minutes  Treatment Type: Individual Therapy.  Reported Symptoms: Patient reported recent confusion and reported confusion when leaving for a destination.  Mental Status Exam: Appearance:  Well Groomed     Behavior: Appropriate  Motor: Normal  Speech/Language:  Clear and Coherent  Affect: Appropriate  Mood: normal  Thought process: normal  Thought content:   WNL  Sensory/Perceptual disturbances:   WNL  Orientation: oriented to person, place, situation, and day of week  Attention: Good  Concentration: Good  Memory: Patient reported decline in memory at times   Fund of knowledge:  Good  Insight:   Good  Judgment:  Good  Impulse Control: Good   Risk Assessment: Danger to Self:  No Patient denied current suicidal ideation Self-injurious Behavior: No Danger to Others: No Patient denied current homicidal ideation Duty to Warn:no Physical Aggression / Violence:No  Access to Firearms a concern: No  Gang Involvement:No   Subjective:  Patient reported she was talking to a friend recently and could not recall the day of the week. Patient reported her friend took patient to the emergency room and patient was diagnosed with a UTI at the hospital, but later patient was advised she did not have a UTI. Patient reported her primary care provider recently prescribed Wellbutrin. Patient stated, "I do get depressed". Patient reported going throughout the day without conversation with others is a trigger for depressed mood. Patient reported she was concerned about the recent confusion. Patient stated,  "I feel like its ok now" in response to current mood. Patient reported feeling down when she doesn't have any activities to participate in or planned for the day. Patient reported confusion at times when leaving for a destination but  is able to recall directions as she is in route to her destination. Patient inquired about medication, Wellbutrin, during session. Patient stated, "I want to quit forgetting things" and reported she would like to recall aspects of her husband and son's deaths and decisions patient made after their deaths.   Interventions: Discussed recent symptoms of confusion and emergency room visit. Assessed current symptoms. Provided psycho education related to depressive symptoms and triggers for changes in mood. Provided psycho education related to psychotropic medications and treatment of depression and anxiety. Clinician utilized motivational interviewing to explore potential goals for therapy. Clinician utilized a task centered approach in collaboration with patient to begin to develop goals for therapy.   Diagnosis:  Adjustment disorder with mixed anxiety and depressed mood    Plan: Patient is to utilize [facility], thought re-framing, relaxation techniques, mindfulness and coping strategies to decrease symptoms associated with Adjustment Disorder. Frequency: bi-weekly  Modality: individual     Long-term goal:   Reduce overall level, frequency, and intensity of the symptoms of depression and anxiety as evidenced by decreased appetite, improvement in sleep, increase in energy, decreased depressed mood, decrease in changes in memory, decreased feelings of loneliness, decreased feelings of anxiety and worry per patient report for at least 3 consecutive months. Target Date: 10/20/23  Progress: progressing   Short-term goal:  To be determined during follow up appointment and goals/treatment plan to be finalized during follow up appointment 11/15/22                     Katherina Right, LCSW

## 2022-10-21 ENCOUNTER — Other Ambulatory Visit: Payer: Self-pay | Admitting: Family Medicine

## 2022-10-21 NOTE — Telephone Encounter (Signed)
Name of Medication: Tramadol Name of Pharmacy: [facility] [place] or Written Date and Quantity: 09/22/22, #40 Last Office Visit and Type: 10/05/22, fatigue Next Office Visit and Type: 11/11/22, f/u  Last Controlled Substance Agreement Date: none Last UDS: none

## 2022-10-22 NOTE — Telephone Encounter (Signed)
ERx 

## 2022-10-28 ENCOUNTER — Ambulatory Visit: Payer: Medicare Other | Attending: Cardiovascular Disease

## 2022-10-28 DIAGNOSIS — I495 Sick sinus syndrome: Secondary | ICD-10-CM

## 2022-10-28 LAB — CUP PACEART REMOTE DEVICE CHECK
Battery Remaining Longevity: 31 mo
Battery Remaining Percentage: 31 %
Battery Voltage: 2.9 V
Brady Statistic AP VP Percent: 63 %
Brady Statistic AP VS Percent: 2.7 %
Brady Statistic AS VP Percent: 34 %
Brady Statistic AS VS Percent: 1 %
Brady Statistic RA Percent Paced: 64 %
Brady Statistic RV Percent Paced: 96 %
Date Time Interrogation Session: 20240119040259
Implantable Lead Connection Status: 753985
Implantable Lead Connection Status: 753985
Implantable Lead Implant Date: 20170907
Implantable Lead Implant Date: 20170907
Implantable Lead Location: 753859
Implantable Lead Location: 753860
Implantable Pulse Generator Implant Date: 20170907
Lead Channel Impedance Value: 390 Ohm
Lead Channel Impedance Value: 530 Ohm
Lead Channel Pacing Threshold Amplitude: 0.625 V
Lead Channel Pacing Threshold Amplitude: 0.75 V
Lead Channel Pacing Threshold Pulse Width: 0.5 ms
Lead Channel Pacing Threshold Pulse Width: 0.5 ms
Lead Channel Sensing Intrinsic Amplitude: 2 mV
Lead Channel Sensing Intrinsic Amplitude: 7.4 mV
Lead Channel Setting Pacing Amplitude: 0.875
Lead Channel Setting Pacing Amplitude: 2 V
Lead Channel Setting Pacing Pulse Width: 0.5 ms
Lead Channel Setting Sensing Sensitivity: 2 mV
Pulse Gen Model: 2272
Pulse Gen Serial Number: 7945283

## 2022-10-30 NOTE — Progress Notes (Signed)
Cough and viral illness symptoms concerning for influenza or covid

## 2022-11-06 ENCOUNTER — Encounter: Payer: Self-pay | Admitting: Family Medicine

## 2022-11-07 ENCOUNTER — Encounter: Payer: Self-pay | Admitting: Family Medicine

## 2022-11-07 ENCOUNTER — Ambulatory Visit (INDEPENDENT_AMBULATORY_CARE_PROVIDER_SITE_OTHER): Payer: Medicare Other | Admitting: Family Medicine

## 2022-11-07 VITALS — BP 130/72 | HR 105 | Temp 98.5°F | Ht 62.0 in | Wt 145.5 lb

## 2022-11-07 DIAGNOSIS — J440 Chronic obstructive pulmonary disease with acute lower respiratory infection: Secondary | ICD-10-CM | POA: Diagnosis not present

## 2022-11-07 DIAGNOSIS — U071 COVID-19: Secondary | ICD-10-CM

## 2022-11-07 MED ORDER — MOLNUPIRAVIR EUA 200MG CAPSULE: 4.0000 | ORAL_CAPSULE | Freq: Two times a day (BID) | ORAL | 0 refills | Status: AC

## 2022-11-07 MED ORDER — PREDNISONE 20 MG PO TABS: 40.0000 mg | ORAL_TABLET | Freq: Every day | ORAL | 0 refills | Status: DC

## 2022-11-07 NOTE — Progress Notes (Signed)
[name] T. Diane Gettel, MD, [place] at [facility] [place], [address]  Phone: [phone]  FAX: [phone]  [name] - 77 y.o. female  MRN [id number]  Date of Birth: 13-May-1946  Date: 11/07/2022  PCP: Ria Bush, MD  Referral: Ria Bush, MD  Chief Complaint  Patient presents with   Covid Positive    Positive home test on Sunday Symptoms started Saturday night   Cough   Headache   Shortness of Breath        Fatigue        Nasal Congestion   Subjective:   [name] is a 78 y.o. very pleasant female patient with Body mass index is 26.61 kg/m. who presents with the following:  Very pleasant patient with a history of COPD presents with acute COVID-19.  Starting sat night, she had a sore throat.  Developed a hacking cough yesterday, and her covid test was positive.    Has a lot of nasal congestion, and she was really nauseated last night.  A little short of breath - on Trelegy at baseline.   Pulse ox is 94%, she has been wheezing somewhat.  She is also been having a decreased appetite.  She had some nausea yesterday, as well, but this seems to be better this morning.  Review of Systems is noted in the HPI, as appropriate  Objective:   BP 130/72   Pulse (!) 105   Temp 98.5 F (36.9 C) (Temporal)   Ht '5\' 2"'$  (1.575 m)   Wt 145 lb 8 oz (66 kg)   SpO2 94%   BMI 26.61 kg/m    Gen: WDWN, NAD. Globally Non-toxic HEENT: Throat clear, w/o exudate, R TM clear, L TM - good landmarks, No fluid present. rhinnorhea.  MMM Frontal sinuses: NT Max sinuses: NT NECK: Anterior cervical  LAD is absent CV: RRR, No M/G/R, cap refill <2 sec PULM: Breathing comfortably in no respiratory distress. + wheezing, no crackles, rhonchi   Laboratory and Imaging Data:  Assessment and Plan:     ICD-10-CM   1. COVID-19  U07.1     2. Chronic obstructive pulmonary disease with acute  lower respiratory infection ([place])  J44.0      Acute COVID-19 in a patient with Eliquis, and Paxlovid is contraindicated here.  I am going to placed the patient on Molnupiravir.  She is wheezing, as well, and I am going to give her some oral steroids to take along with her Trelegy and DuoNebs.  Medication Management during today's office visit: Meds ordered this encounter  Medications   molnupiravir EUA (LAGEVRIO) 200 mg CAPS capsule    Sig: Take 4 capsules (800 mg total) by mouth 2 (two) times daily for 5 days.    Dispense:  40 capsule    Refill:  0   predniSONE (DELTASONE) 20 MG tablet    Sig: Take 2 tablets (40 mg total) by mouth daily.    Dispense:  14 tablet    Refill:  0   There are no discontinued medications.  Orders placed today for conditions managed today: No orders of the defined types were placed in this encounter.   Disposition: No follow-ups on file.  Dragon [employer] One speech-to-text software was used for transcription in this dictation.  Possible transcriptional errors can occur using Editor, commissioning.   Signed,  Maud Deed. Paige Monarrez, MD   Outpatient Encounter Medications as of 11/07/2022  Medication Sig  acetaminophen (TYLENOL) 500 MG tablet Take 2 tablets (1,000 mg total) by mouth daily.   albuterol (VENTOLIN HFA) 108 (90 Base) MCG/ACT inhaler Inhale 2 puffs into the lungs every 6 (six) hours as needed for wheezing.   amoxicillin (AMOXIL) 500 MG capsule TAKE 4 CAPSULES(2000 MG) BY MOUTH 1 TIME BEFORE DENTAL WORK FOR 1 DOSE   apixaban (ELIQUIS) 5 MG TABS tablet TAKE 1 TABLET(5 MG) BY MOUTH TWICE DAILY   ascorbic acid (VITAMIN C) 500 MG tablet Take 1 tablet (500 mg total) by mouth daily.   Biotin 1000 MCG tablet Take 1,000 mcg by mouth daily.   buPROPion ER (WELLBUTRIN SR) 100 MG 12 hr tablet Take 1 tablet (100 mg total) by mouth every morning.   calcium carbonate (OSCAL) 1500 (600 Ca) MG TABS tablet Take 600 mg of elemental calcium by mouth daily.    carboxymethylcellulose (REFRESH PLUS) 0.5 % SOLN Place 1 drop into both eyes 3 (three) times daily as needed (dry eyes).   Cholecalciferol (VITAMIN D) 50 MCG (2000 UT) tablet Take 2,000 Units by mouth daily.   docusate sodium (COLACE) 100 MG capsule Take 200 mg by mouth every evening.   furosemide (LASIX) 40 MG tablet Take 1 tablet (40 mg total) by mouth daily. May take an extra tablet daily as needed for swelling.   gabapentin (NEURONTIN) 300 MG capsule Take 300 mg by mouth at bedtime.   ipratropium-albuterol (DUONEB) 0.5-2.5 (3) MG/3ML SOLN Take 3 mLs by nebulization every 6 (six) hours as needed.   lovastatin (MEVACOR) 40 MG tablet TAKE 1 TABLET(40 MG) BY MOUTH AT BEDTIME   molnupiravir EUA (LAGEVRIO) 200 mg CAPS capsule Take 4 capsules (800 mg total) by mouth 2 (two) times daily for 5 days.   montelukast (SINGULAIR) 10 MG tablet TAKE 1 TABLET(10 MG) BY MOUTH AT BEDTIME   Multiple Vitamins-Minerals (PRESERVISION AREDS 2 PO) Take 2 capsules by mouth every evening.    polyethylene glycol (MIRALAX / GLYCOLAX) packet Take 17 g by mouth daily.   potassium chloride (KLOR-CON) 10 MEQ tablet TAKE 2 TABLETS(20 MEQ) BY MOUTH DAILY   predniSONE (DELTASONE) 20 MG tablet Take 2 tablets (40 mg total) by mouth daily.   silver sulfADIAZINE (SILVADENE) 1 % cream Apply 1 application topically daily.   traMADol (ULTRAM) 50 MG tablet TAKE 1 TABLET(50 MG) BY MOUTH TWICE DAILY AS NEEDED   traZODone (DESYREL) 50 MG tablet Take 50 mg by mouth at bedtime.   TRELEGY ELLIPTA 200-62.5-25 MCG/ACT AEPB INHALE 1 PUFF INTO THE LUNGS DAILY   triamcinolone cream (KENALOG) 0.1 % Apply 1 Application topically 2 (two) times daily.   Vibegron (GEMTESA) 75 MG TABS Take 75 mg by mouth daily.   vitamin B-12 (CYANOCOBALAMIN) 500 MCG tablet Take 1 tablet (500 mcg total) by mouth 2 (two) times a week.   Zinc 50 MG CAPS Take 1 capsule (50 mg total) by mouth daily.   Facility-Administered Encounter Medications as of 11/07/2022   Medication   ondansetron (ZOFRAN) 4 mg in sodium chloride 0.9 % 50 mL IVPB

## 2022-11-07 NOTE — Telephone Encounter (Signed)
Per chart review tab pt has already been seen by Dr Lorelei Pont this morning. Pt has already sent this my chart note to Dr Darnell Level PCP as Juluis Rainier.

## 2022-11-07 NOTE — Telephone Encounter (Signed)
[place] Night - Client TELEPHONE ADVICE RECORD [facility] Patient Name: [name] Gender: Female DOB: 08-Dec-1945 Age: 77 Y 62 M 9 D Return Phone Number: [phone] (Primary), [phone] (Secondary) Address[place]  [address] Client [place] Night - Client Client Site [place] Provider Ria Bush - MD Contact Type Call Who Is Calling Patient / Member / Family / Caregiver Call Type Triage / Clinical Relationship To Patient Self Return Phone Number [phone] (Secondary) Chief Complaint Cough Reason for Call Symptomatic / Request for [place] states she tested positive with in home covid test and wants paxlovid. She has a scratchy throat and hacky cough. Translation No Disp. Time Eilene Ghazi Time) Disposition Final User 11/06/2022 8:31:46 AM Attempt made - no message left Cyd Silence, Amy 11/06/2022 8:55:46 AM Send To RN Personal Selena Batten, RN, Amy 11/06/2022 9:08:10 AM Attempt made - message left Cox, RN, [facility] 11/06/2022 9:27:07 AM FINAL ATTEMPT MADE - message left Yes Cox, RN, [facility] Final Disposition 11/06/2022 9:27:07 AM FINAL ATTEMPT MADE - message left Yes Cox, RN, Earnest Bailey Comments User: Clabe Seal, RN Date/Time (Eastern Time): 11/06/2022 8:31:31 AM primary # rings a few times, then states "number you are calling is not accepting your call at this time." secondary # rings and then disconnects after 20 second

## 2022-11-07 NOTE — Telephone Encounter (Signed)
[place] Night - Client TELEPHONE ADVICE RECORD [facility] Patient Name: [name] Gender: Female DOB: 1946/07/14 Age: 77 Y 11 M 9 D Return Phone Number: [phone] (Primary), [phone] (Secondary) Address: [place]  [address] Client [place] Night - Client Client Site [place] Provider Ria Bush - MD Contact Type Call Who Is Calling Patient / Member / Family / Caregiver Call Type Triage / Clinical Relationship To Patient Self Return Phone Number [phone] (Secondary) Chief Complaint Fatigue (greater than THREE MONTHS old) Reason for Call Symptomatic / Request for [place] states has sore throat, hacky cough, stuffy nose; fatigue; COVID19 test was pos this morning; Translation No Nurse Assessment Nurse: Ronnell Freshwater, RN, [place] Gave Date/Time (Eastern Time): 11/06/2022 11:01:20 AM Confirm and document reason for call. If symptomatic, describe symptoms. ---Caller states that since last night she has been developing symptoms; this AM, C/O sore throat, fatigue, nasal congestion, cough. Tested for COVID this AM and was positive. Denies fever at this time. Does the patient have any new or worsening symptoms? ---Yes Will a triage be completed? ---Yes Related visit to physician within the last 2 weeks? ---No Does the PT have any chronic conditions? (i.e. diabetes, asthma, this includes High risk factors for pregnancy, etc.) ---Yes List chronic conditions. ---COPD, AF Is this a behavioral health or substance abuse call? ---No Guidelines Guideline Title Affirmed Question Affirmed Notes Nurse Date/Time ([place] Time) COVID-19 - Diagnosed or Suspected [1] HIGH RISK patient (e.g., weak immune system, age > 41 years, obesity with BMI 30 or higher, pregnant, chronic lung disease or other chronic medical  condition) AND [2] COVID Ronnell Freshwater, RN, [place] Gave 11/06/2022 11:03:50 AM PLEASE NOTE: All timestamps contained within this report are represented as [place] Standard Time. CONFIDENTIALTY NOTICE: This fax transmission is intended only for the addressee. It contains information that is legally privileged, confidential or otherwise protected from use or disclosure. If you are not the intended recipient, you are strictly prohibited from reviewing, disclosing, copying using or disseminating any of this information or taking any action in reliance on or regarding this information. If you have received this fax in error, please notify us immediately by telephone so that we can arrange for its return to [place]. Phone: [phone], Toll-Free: [phone], Fax: [phone] Page: 2 of 2 Call Id: [id number] Guidelines Guideline Title Affirmed Question Affirmed Notes Nurse Date/Time Eilene Ghazi Time) symptoms (e.g., cough, fever) (Exceptions: Already seen by PCP and no new or worsening symptoms.) Disp. Time Eilene Ghazi Time) Disposition Final User 11/06/2022 11:10:23 AM Call PCP within 24 Hours Yes Ronnell Freshwater, RN, [place] Gave Final Disposition 11/06/2022 11:10:23 AM Call PCP within 24 Hours Yes Ronnell Freshwater, RN, Eusebio Friendly Disagree/Comply Comply Caller Understands Yes PreDisposition Call Doctor Care Advice Given Per Guideline CALL PCP WITHIN 24 HOURS: * You need to discuss this with your doctor (or NP/PA) within the next 24 hours. * IF OFFICE WILL BE OPEN: Call the office when it opens tomorrow morning. CALL BACK IF: * You become worse CARE ADVICE given per COVID-19 - DIAGNOSED OR SUSPECTED (Adult) guideline. Referrals REFERRED TO PCP OFFIC

## 2022-11-08 ENCOUNTER — Ambulatory Visit: Payer: Medicare Other | Admitting: Internal Medicine

## 2022-11-11 ENCOUNTER — Ambulatory Visit (INDEPENDENT_AMBULATORY_CARE_PROVIDER_SITE_OTHER): Payer: Medicare Other | Admitting: Family Medicine

## 2022-11-11 ENCOUNTER — Encounter: Payer: Self-pay | Admitting: Family Medicine

## 2022-11-11 VITALS — BP 126/80 | HR 87 | Temp 97.4°F | Ht 62.0 in | Wt 146.0 lb

## 2022-11-11 DIAGNOSIS — U071 COVID-19: Secondary | ICD-10-CM

## 2022-11-11 DIAGNOSIS — M545 Low back pain, unspecified: Secondary | ICD-10-CM

## 2022-11-11 DIAGNOSIS — M4810 Ankylosing hyperostosis [Forestier], site unspecified: Secondary | ICD-10-CM

## 2022-11-11 DIAGNOSIS — G8929 Other chronic pain: Secondary | ICD-10-CM

## 2022-11-11 DIAGNOSIS — F4323 Adjustment disorder with mixed anxiety and depressed mood: Secondary | ICD-10-CM | POA: Diagnosis not present

## 2022-11-11 HISTORY — DX: COVID-19: U07.1

## 2022-11-11 MED ORDER — BUPROPION HCL ER (SR) 100 MG PO TB12: 100.0000 mg | ORAL_TABLET | Freq: Every morning | ORAL | 2 refills | Status: DC

## 2022-11-11 MED ORDER — TRAMADOL HCL 50 MG PO TABS: 50.0000 mg | ORAL_TABLET | Freq: Two times a day (BID) | ORAL | 3 refills | Status: DC | PRN

## 2022-11-11 NOTE — Patient Instructions (Addendum)
I'm glad you're doing better from COVID standpoint. Continue current medicines including wellbutrin '100mg'$  in am.  Ok to increase tramadol to twice daily as needed #60/month.  Return in 3 months for follow up visit mood.

## 2022-11-11 NOTE — Assessment & Plan Note (Signed)
Continue Tramadol as per above.

## 2022-11-11 NOTE — Assessment & Plan Note (Signed)
Has seen neurosurgery  On gabapentin '300mg'$  nightly.  Also takes tramadol - notes increased need recently, has been taking BID and requests updated # per month - will send tramadol '50mg'$  #60 /month Rx.

## 2022-11-11 NOTE — Progress Notes (Addendum)
Patient ID: [name], female    DOB: Sep 17, 1946, 77 y.o.   MRN: [id number]  This visit was conducted in person.  BP 126/80   Pulse 87   Temp (!) 97.4 F (36.3 C) (Temporal)   Ht '5\' 2"'$  (1.575 m)   Wt 146 lb (66.2 kg)   SpO2 95%   BMI 26.70 kg/m    CC: follow up visit  Subjective:   HPI: [name] is a 77 y.o. female presenting on 11/11/2022 for Medical Management of Chronic Issues (Here for 4-6 wk f/u. Also, wants to have lungs checked. Saw Dr. Edilia Bo recently for Covid. Told it was ok to keep today's OV. )   New kitten at home.   Seen 10/09/2022 at ER with dx UTI treated with rocephin IV then keflex course. UCx at that time returned no growth. Respiratory panel negative. CXR showed cardiomegaly with mod pulm vascular congestion ?early CHF exacerbation vs possible early infection to RLL, head CT reassuring. ?early PNA - she has felt better after antibiotics.   Saw Dr Lorelei Pont 11/07/2022 with COVID infection treating with molnupiravir and prednisone '40mg'$  7d burst. First day of symptoms was 11/05/2022. Symptoms are significantly improved. Notes persistent rattly cough. Using nyquil at night time. No residual fever, chills, dyspnea or chest discomfort.   Wellbutrin started '100mg'$  SR daily - she hasn't noted much benefit however PHQ score has decreased from 15-->5. This may be new cat effect as well.   Notes worsening back pain despite tramadol use - she has needed to increase to bid scheduled.      Relevant past medical, surgical, family and social history reviewed and updated as indicated. Interim medical history since our last visit reviewed. Allergies and medications reviewed and updated. Outpatient Medications Prior to Visit  Medication Sig Dispense Refill   acetaminophen (TYLENOL) 500 MG tablet Take 2 tablets (1,000 mg total) by mouth daily.     albuterol (VENTOLIN HFA) 108 (90 Base) MCG/ACT inhaler Inhale 2 puffs into the lungs every 6 (six) hours as  needed for wheezing. 18 g 6   amoxicillin (AMOXIL) 500 MG capsule TAKE 4 CAPSULES(2000 MG) BY MOUTH 1 TIME BEFORE DENTAL WORK FOR 1 DOSE 4 capsule 1   apixaban (ELIQUIS) 5 MG TABS tablet TAKE 1 TABLET(5 MG) BY MOUTH TWICE DAILY 60 tablet 5   ascorbic acid (VITAMIN C) 500 MG tablet Take 1 tablet (500 mg total) by mouth daily.     Biotin 1000 MCG tablet Take 1,000 mcg by mouth daily.     calcium carbonate (OSCAL) 1500 (600 Ca) MG TABS tablet Take 600 mg of elemental calcium by mouth daily.     carboxymethylcellulose (REFRESH PLUS) 0.5 % SOLN Place 1 drop into both eyes 3 (three) times daily as needed (dry eyes).     Cholecalciferol (VITAMIN D) 50 MCG (2000 UT) tablet Take 2,000 Units by mouth daily.     docusate sodium (COLACE) 100 MG capsule Take 200 mg by mouth every evening.     furosemide (LASIX) 40 MG tablet Take 1 tablet (40 mg total) by mouth daily. May take an extra tablet daily as needed for swelling. 135 tablet 2   gabapentin (NEURONTIN) 300 MG capsule Take 300 mg by mouth at bedtime.     ipratropium-albuterol (DUONEB) 0.5-2.5 (3) MG/3ML SOLN Take 3 mLs by nebulization every 6 (six) hours as needed. 360 mL 0   lovastatin (MEVACOR) 40 MG tablet TAKE 1 TABLET(40 MG) BY MOUTH AT BEDTIME 90  tablet 1   molnupiravir EUA (LAGEVRIO) 200 mg CAPS capsule Take 4 capsules (800 mg total) by mouth 2 (two) times daily for 5 days. 40 capsule 0   montelukast (SINGULAIR) 10 MG tablet TAKE 1 TABLET(10 MG) BY MOUTH AT BEDTIME 90 tablet 1   Multiple Vitamins-Minerals (PRESERVISION AREDS 2 PO) Take 2 capsules by mouth every evening.      polyethylene glycol (MIRALAX / GLYCOLAX) packet Take 17 g by mouth daily. 14 each 0   potassium chloride (KLOR-CON) 10 MEQ tablet TAKE 2 TABLETS(20 MEQ) BY MOUTH DAILY 180 tablet 3   predniSONE (DELTASONE) 20 MG tablet Take 2 tablets (40 mg total) by mouth daily. 14 tablet 0   silver sulfADIAZINE (SILVADENE) 1 % cream Apply 1 application topically daily. 50 g 1   traZODone  (DESYREL) 50 MG tablet Take 50 mg by mouth at bedtime.     TRELEGY ELLIPTA 200-62.5-25 MCG/ACT AEPB INHALE 1 PUFF INTO THE LUNGS DAILY 60 each 11   triamcinolone cream (KENALOG) 0.1 % Apply 1 Application topically 2 (two) times daily.     Vibegron (GEMTESA) 75 MG TABS Take 75 mg by mouth daily.     vitamin B-12 (CYANOCOBALAMIN) 500 MCG tablet Take 1 tablet (500 mcg total) by mouth 2 (two) times a week.     Zinc 50 MG CAPS Take 1 capsule (50 mg total) by mouth daily.  0   buPROPion ER (WELLBUTRIN SR) 100 MG 12 hr tablet Take 1 tablet (100 mg total) by mouth every morning. 30 tablet 3   traMADol (ULTRAM) 50 MG tablet TAKE 1 TABLET(50 MG) BY MOUTH TWICE DAILY AS NEEDED 40 tablet 0   Facility-Administered Medications Prior to Visit  Medication Dose Route Frequency Provider Last Rate Last Admin   ondansetron (ZOFRAN) 4 mg in sodium chloride 0.9 % 50 mL IVPB  4 mg Intravenous Q6H PRN Kristeen Miss, MD         Per HPI unless specifically indicated in ROS section below Review of Systems  Objective:  BP 126/80   Pulse 87   Temp (!) 97.4 F (36.3 C) (Temporal)   Ht '5\' 2"'$  (1.575 m)   Wt 146 lb (66.2 kg)   SpO2 95%   BMI 26.70 kg/m   Wt Readings from Last 3 Encounters:  11/11/22 146 lb (66.2 kg)  11/07/22 145 lb 8 oz (66 kg)  10/12/22 144 lb 2 oz (65.4 kg)      Physical Exam Vitals and nursing note reviewed.  Constitutional:      Appearance: Normal appearance. She is not ill-appearing.  Eyes:     Extraocular Movements: Extraocular movements intact.     Pupils: Pupils are equal, round, and reactive to light.  Cardiovascular:     Rate and Rhythm: Normal rate and regular rhythm.     Pulses: Normal pulses.     Heart sounds: Murmur (3/6 systolic along sternal border) heard.  Pulmonary:     Effort: Pulmonary effort is normal. No respiratory distress.     Breath sounds: Rhonchi (RLL) present. No wheezing or rales.     Comments: Coarse breath sounds RLL Musculoskeletal:     Right lower  leg: No edema.     Left lower leg: No edema.  Skin:    General: Skin is warm and dry.  Neurological:     Mental Status: She is alert.  Psychiatric:        Mood and Affect: Mood normal.        Behavior:  Behavior normal.          11/11/2022    2:42 PM 10/05/2022   12:51 PM 06/23/2022    2:24 PM 06/16/2021    8:49 AM 06/10/2020    9:26 AM  Depression screen PHQ 2/9  Decreased Interest 1 1 0 0 0  Down, Depressed, Hopeless '1 2 1 '$ 0 0  PHQ - 2 Score '2 3 1 '$ 0 0  Altered sleeping '1 3 2    '$ Tired, decreased energy '2 3 2    '$ Change in appetite 0 3 0    Feeling bad or failure about yourself  0 1 0    Trouble concentrating 0 1 1    Moving slowly or fidgety/restless 0 1 0    Suicidal thoughts 0 0 0    PHQ-9 Score '5 15 6    '$ Difficult doing work/chores Somewhat difficult  Somewhat difficult         11/11/2022    2:43 PM 10/05/2022   12:52 PM  GAD 7 : Generalized Anxiety Score  Nervous, Anxious, on Edge 0 1  Control/stop worrying 1 1  Worry too much - different things 1 1  Trouble relaxing 1 0  Restless 0 0  Easily annoyed or irritable 0 0  Afraid - awful might happen 0 1  Total GAD 7 Score 3 4  Anxiety Difficulty Somewhat difficult Somewhat difficult   Assessment & Plan:   Problem List Items Addressed This Visit     Chronic lower back pain    Has seen neurosurgery  On gabapentin '300mg'$  nightly.  Also takes tramadol - notes increased need recently, has been taking BID and requests updated # per month - will send tramadol '50mg'$  #60 /month Rx.       Relevant Medications   traMADol (ULTRAM) 50 MG tablet   buPROPion ER (WELLBUTRIN SR) 100 MG 12 hr tablet   Adjustment disorder with mixed anxiety and depressed mood - Primary    No significant improvement despite wellbutrin however PHQ score has dropped from 15 to 5 daily. Unsure if this is antidepressant effect or new kitten effect. Will continue wellbutrin SR '100mg'$  daily at this time.       DISH (diffuse idiopathic skeletal hyperostosis)     Continue Tramadol as per above.       COVID-19 virus infection    Today is day #6 of symptoms, completing molnupiravir course, overall significant improvement noted. Continued supportive measures reviewed. Lungs sound clear today.         Meds ordered this encounter  Medications   traMADol (ULTRAM) 50 MG tablet    Sig: Take 1 tablet (50 mg total) by mouth 2 (two) times daily as needed.    Dispense:  60 tablet    Refill:  3   buPROPion ER (WELLBUTRIN SR) 100 MG 12 hr tablet    Sig: Take 1 tablet (100 mg total) by mouth every morning.    Dispense:  90 tablet    Refill:  2    No orders of the defined types were placed in this encounter.   Patient Instructions  I'm glad you're doing better from COVID standpoint. Continue current medicines including wellbutrin '100mg'$  in am.  Ok to increase tramadol to twice daily as needed #60/month.  Return in 3 months for follow up visit mood.   Follow up plan: No follow-ups on file.  Ria Bush, MD

## 2022-11-11 NOTE — Assessment & Plan Note (Addendum)
Today is day #6 of symptoms, completing molnupiravir course, overall significant improvement noted. Continued supportive measures reviewed. Lungs sound clear today.

## 2022-11-11 NOTE — Assessment & Plan Note (Signed)
No significant improvement despite wellbutrin however PHQ score has dropped from 15 to 5 daily. Unsure if this is antidepressant effect or new kitten effect. Will continue wellbutrin SR '100mg'$  daily at this time.

## 2022-11-14 NOTE — Progress Notes (Signed)
Remote pacemaker transmission.   

## 2022-11-15 ENCOUNTER — Ambulatory Visit (INDEPENDENT_AMBULATORY_CARE_PROVIDER_SITE_OTHER): Payer: Medicare Other | Admitting: Clinical

## 2022-11-15 DIAGNOSIS — F4323 Adjustment disorder with mixed anxiety and depressed mood: Secondary | ICD-10-CM

## 2022-11-15 NOTE — Progress Notes (Signed)
                Glynn Yepes, LCSW 

## 2022-11-15 NOTE — Progress Notes (Addendum)
[facility] Counselor/Therapist Progress Note  Patient ID: [name], MRN: [id number],    Date: 11/15/2022  Time Spent: 10:34am - 11:18am : 44 minutes   Treatment Type: Individual Therapy  Reported Symptoms: Patient reported "blah" mood today and reported fatigue.  Mental Status Exam: Appearance:  Well Groomed     Behavior: Appropriate  Motor: Normal  Speech/Language:  Clear and Coherent  Affect: Appropriate  Mood: Patient reported "blah" mood  Thought process: normal  Thought content:   WNL  Sensory/Perceptual disturbances:   WNL  Orientation: oriented to person, place, and situation  Attention: Good  Concentration: Good  Memory: WNL  Fund of knowledge:  Good  Insight:   Good  Judgment:  Good  Impulse Control: Good   Risk Assessment: Danger to Self:  No Patient denied current suicidal ideation  Self-injurious Behavior: No Danger to Others: No Patient denied current homicidal ideation Duty to Warn:no Physical Aggression / Violence:No  Access to Firearms a concern: No  Gang Involvement:No   Subjective: Patient reported she has had COVID since last session and reported fatigue as a result.  Patient reported "about the same" in response to mood since last session. Patient reported she adopted a cat since last session and reported it's going well. Patient reported "blah" mood today. Patient reported she would like to recall information related to the deaths of her husband and son in response to additional goals for therapy. Patient reported feeling anxious about how long she will be able to remain her home independently. Patient reported her physicians and social network are in his area and she doesn't want to lose those connections. Patient reported "the what ifs" trigger fear of how long she will be able to remain in her home and stated, "I dwell on them".   Patient reported inter stem device causes anxiety due to device not working properly. Patient  agreed to therapy goals/treatment plan.  Patient reported concern that her insurance will only cover one session per month and scheduled a follow up appointment for next month.   Interventions: Cognitive Behavioral Therapy and Motivational Interviewing.  Clinician conducted session in person at clinician's office at [facility]. Clinician utilized motivational interviewing to explore potential goals for therapy. Clinician utilized a task centered approach in collaboration with patient to develop goals for therapy. Provided psycho education related to symptoms of depression and anxiety, as well as, the cycle of depression and anxiety and use of CBT. Explored patient's feelings of anxiety and identified triggers for anxiety. Clinician requested patient complete thought record for homework.    Diagnosis:  Adjustment disorder with mixed anxiety and depressed mood      Plan: Patient is to utilize [facility], thought re-framing, relaxation techniques, mindfulness and coping strategies to decrease symptoms associated with Adjustment Disorder. Frequency: monthly  Modality: individual      Long-term goal:   Reduce overall level, frequency, and intensity of the symptoms of depression and anxiety as evidenced by decreased appetite, improvement in sleep, increase in energy, decreased depressed mood, decrease in changes in memory, decreased feelings of loneliness, decreased feelings of anxiety and worry per patient report for at least 3 consecutive months.  Target Date: 10/20/23  Progress: progressing    Short-term goal:  Process grief as it relates to the loss of patient's husband and son and develop/implement healthy coping skills to utilize in response Target Date: 05/16/23  Progress: progressing      Katherina Right, LCSW

## 2022-11-19 ENCOUNTER — Other Ambulatory Visit: Payer: Self-pay | Admitting: Family Medicine

## 2022-11-21 NOTE — Telephone Encounter (Signed)
Refill request Trazodone Last office visit 11/11/22 Last refill 08/23/22 #30/2

## 2022-11-22 ENCOUNTER — Encounter: Payer: Self-pay | Admitting: Family Medicine

## 2022-11-22 NOTE — Telephone Encounter (Signed)
ERx 

## 2022-11-22 NOTE — Telephone Encounter (Signed)
Request was fwd to Dr. Darnell Level today. (See 11/19/22 refill note.)

## 2022-11-24 DIAGNOSIS — N3281 Overactive bladder: Secondary | ICD-10-CM | POA: Insufficient documentation

## 2022-12-02 ENCOUNTER — Encounter: Payer: Self-pay | Admitting: Internal Medicine

## 2022-12-02 ENCOUNTER — Ambulatory Visit (INDEPENDENT_AMBULATORY_CARE_PROVIDER_SITE_OTHER): Payer: Medicare Other | Admitting: Internal Medicine

## 2022-12-02 VITALS — BP 124/66 | HR 89 | Temp 97.9°F | Ht 64.0 in | Wt 150.6 lb

## 2022-12-02 DIAGNOSIS — R918 Other nonspecific abnormal finding of lung field: Secondary | ICD-10-CM

## 2022-12-02 DIAGNOSIS — J449 Chronic obstructive pulmonary disease, unspecified: Secondary | ICD-10-CM

## 2022-12-02 NOTE — Addendum Note (Signed)
Addended by: Meredith Staggers A on: 12/02/2022 02:44 PM   Modules accepted: Orders

## 2022-12-02 NOTE — Progress Notes (Signed)
PCP Diane Bush, MD  HPI    OV 08/02/2017  Chief Complaint  Patient presents with   Follow-up    Pt states that she got bronchitis x1 month ago. Has been on two rounds of abx and two rounds of steroids. States that it is not in her chest anymore but is still having drainage, sore throat, dry cough, occ. SOB. Denies any CP.    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 She comes for routine follow-up. Recently she had COPD flareup was treated by primary care. Then I called in antibiotic and repeat prednisone and this again helped her. Nevertheless she is frustrated by postnasal drainage and the resultant cough. She says normally for COPD exacerbations are associated with initial onset of sinus congestion. Then she's always left with a residual postnasal drip but this time it has lingered longer. She says Sudafed helps her but is no longer available over-the-counter. She really wants relief about this critically. COPD cat score is 14. Is also asking me to sign handicap parking sticker   OV 10/31/2017  Chief Complaint  Patient presents with   Follow-up    In hosp. 11/13-11/15 due to sepsis from UTI. Pt states she is doing good but can't seem to get her energy back. Pt currently has laryngitis and SOB with exertion.    Follow-up Gold stage II/III COPD not on home oxygen but on triple inhaler therapy.  Is a routine follow-up.  Since I last saw her in the middle of November 2018 she got diagnosed with E. coli sepsis and admitted to the hospital.  Since then she has significant amount of fatigue.  The fatigue is not improved much.  She has suspended her maintenance pulmonary rehabilitation but plans to restart it in March 2019.  She has not had any COPD exacerbations.  She is up-to-date with the flu shot.  Overall COPD CAT score is stable but there is an increase in the level of fatigue.  The only new issue that she complains about is some mild  hoarseness in voice going on for several months.  This is present early in the morning when she wakes up and then as the day goes on it is better.  She notices it when she talks a lot of time in the phone after a long period of rest.  It is insidious onset.  It is not progressive.  There are no other associated symptoms.   OV 02/05/2018  Chief Complaint  Patient presents with   Follow-up    Pt states she saw PCP two weeks ago and had pna. Pt states she is still not over the pna and has no energy, has hoarseness in voice, cough, and has chest tightness.     Follow-up gold stage II/III COPD not on home oxygen but on triple inhaler therapy with Singulair  Routine follow-up.  She tells me t of labs from January 22, 2018 hat January 22, 2018 she started feeling chest tightness wheezing and also increase head cold.  She saw her primary care physician Dr. Danise [name].  Apparently all the blood work was normal except for mild increase in liver enzymes which she says she fluctuates with.  He was initially thinking a sinus congestion but when he did an x-ray of the report came back as a right lower lobe pneumonia [I personally visualized this x-ray and I agree she might not have right lower lobe pneumonia because in 2015 CT  chest she had some chronic scarring at that area].  She was treated with 5-day Levaquin and a Medrol Dosepak but despite this other than subjective fevers are resolving she still has significant cough congestion and chest tightness without any orthopnea proximal nocturnal dyspnea.  COPD CAT score shows significant deterioration.  She is not feeling well.  Of note, she is intentionally losing weight because of weight watchers but she has not increased her fluid intake adequately.  She has quit all diet soda  Show slight increase in liver enzymes and also slight increase in creatinine compared to 6 months ago.   OV 03/13/2018  Chief Complaint  Patient presents with   Follow-up    Breathing is  improved since last OV. Still reports DOE and occasional cough. Denies chest tightness or wheezing.    [name]  presents for follow-up of her gold stage II /3 of COPD.  On triple inhaler therapy with Spiriva and Brio and also on Singulair   Overall in terms of her COPD: She is stable.  She is compliant with her inhalers.  She is not on oxygen therapy.  Her symptoms actually improved.  In the interim she was diagnosed with atrial fibrillation and she is placed on Xarelto.  She is frustrated because she has to be on 1 more extra medication.  She did have pulmonary function test that is documented below because it has been a while since we did one.  It actually shows improvement in the lung function.  She also had a CT scan of the chest because the last one was in 2015.  This shows scattered lung nodules with the largest one 8 mm in the right lower lobe.  The radiologist has described this is nonspecific in appearance.  She heard the result and she has been extremely anxious and upset.  She is worried that her colon cancer has metastasized or she has lung cancer.  Of note, I personally visualized the CT chest and agree with the findings   OV 06/27/2018  Subjective:  Patient ID: [name], female , DOB: [birth year] , age 31 y.o. , MRN: [id number] , ADDRESS: [address] [place] [address]   06/27/2018 -   Chief Complaint  Patient presents with   Follow-up    Pt states her breathing has been doing good since last visit. States she has been having some BP issues to the point she fainted and cracked some ribs.  Denies any problems with cough or CP.     HPI [name] 77 y.o. - follow-up for  - COPD: Overall this is stable with inhaler therapy. She has not had her flu shot. She wants to wait 2 weeks because she just had first shot of her shingles vaccine. She is up-to-date with her pneumonia vaccine. In the interim she has lost weight 60 pounds intentionally and this is improved  on Score  Multiple  Lung nodules: She had follow-up CT chest in September 2019 and compared to May 2019: One of the nodules is resolved to 16 mm and stable. She does have some rib fractures subacute and this is because she fell down. She's been having balance issues with the weight loss and orthostatic hypotension. Both of this is being managed by neurosurgery and primary care  Travel advice: She plans to have back surgery for her foot drop after this she plans to recover at her son's home near [place]. Therefore she wants a antibiotic and prednisone taper to  be handy with her in case of a future COPD exacerbation.    Results for TORIE, TOWLE (MRN [id number]) as of 03/13/2018 11:58  Ref. Range 11/29/2013 16:04 04/01/2016 10:10 03/13/2018 10:36  FEV1-Pre Latest Units: L 0.93 1.35 1.42  FEV1-%Pred-Pre Latest Units: % 34 55 59   Results for XOIE, KREUSER (MRN [id number]) as of 03/13/2018 11:58  Ref. Range 03/13/2018 10:36  DLCO cor Latest Units: ml/min/mmHg 22.70  DLCO cor % pred Latest Units: % 84     CT chest Sept 4, 2019 Lungs/Pleura: Pleural thickening posteriorly in the right hemithorax is stable. There is no significant pleural effusion. There is stable subpleural scarring posteriorly in the right lower lobe. There is central airway thickening with scattered tree-in-bud nodularity in both lungs, similar to the most recent prior study. Nodular scarring in both upper lobes is stable. The right lower lobe perifissural nodule present on the most recent study has resolved. There is a stable 6 x 5 mm right lower lobe nodule on image 119/3. No new or enlarging pulmonary nodules.    IMPRESSION: 1. Generally stable chronic lung disease with pleuroparenchymal scarring and scattered nodularity. Most of these nodules have a tree-in-bud appearance, likely post infectious or inflammatory. The dominant right lower lobe nodule seen on the most recent study has resolved. No new or enlarging  nodules. 2. No acute chest findings. Healing subacute right-sided rib fractures. 3. Extensive Aortic Atherosclerosis (ICD10-I70.0).     Electronically Signed   By: Richardean Sale [name]   On: 06/13/2018 14    ROS - per HPI  OV 01/22/2020  Subjective:  Patient ID: [name], female , DOB: 1946-03-26 , age 71 y.o. , MRN: [id number] , ADDRESS: [address] [place] [address]   01/22/2020 -   Chief Complaint  Patient presents with   Follow-up    Pt states she has been doing good since last visit and denies any complaints.     HPI [name] 77 y.o. -presents for follow-up.  Last seen in 2019.  After that because of the pandemic have not seen her.  She saw the nurse practitioner once.  The big interval changes that she has lost a lot of weight.  It is intentional because she is try to keep her self it.  From a functional standpoint COPD symptoms are much improved after weight loss.  She takes Spiriva Singulair and Breo.  However she is having significant back pain.  Other than that she feels fine.  She not able to do pulmonary rehab because of the back pain.  She has had a Covid vaccine  Other issues lung nodules.  Her last CT scan of the chest was in July 2020.  She has waxing and waning.  She is willing to have another CT scan of the chest at the appropriate time interval.      CAT COPD Symptom & Quality of Life Score ([place]) 0 is no burden. 5 is highest burden 08/02/2017  10/31/2017  02/05/2018  06/27/2018 60+ # intentioal weight loss  Never Cough -> Cough all the time _0 No phlegm in chest -> Chest is full of phlegm 0 0 3 0  No chest tightness -> Chest feels very tight 1 0 3 0  No dyspnea for 1 flight stairs/hill -> Very dyspneic for 1 flight of stairs _1 No limitations for ADL at home -> Very limited with ADL at home 1  0 5 3  Confident leaving home -> Not at all confident leaving home 0 0 3 1  Sleep soundly -> Do not sleep  soundly because of lung condition 4 4 3 3  $ [employer] -> No energy at all 2 3 5 4  $ TOTAL Score (max 40)  14 13 31 16   $ CAT Score 01/22/2020  Total CAT Score 9      ROS - per HPI    OV 07/14/2020  Subjective:  Patient ID: Yeraldi Alfonse [name], female , DOB: 1946-02-10 , age 59 y.o. , MRN: [id number] , ADDRESS: [address] [place] [address]  PCP Diane Bush, MD    07/14/2020 -   Chief Complaint  Patient presents with   Follow-up    no complaints    Advanced COPD on Spiriva and Breo and Singulair Multiple lung nodules with waxing and waning quality -last CT scan July 2020  HPI [name] 77 y.o. -presents for follow-up of her COPD.  Last seen in early part of this year.  She is supposed to have a follow-up CT scan of the chest but this has not happened.  Patient tells me that she wants to hold off on getting the CT scan of the chest right now but will do it at the time of follow-up.  Last CT scan of the chest was in July 2020.  At this point in time she continues on Spiriva, Breo and Singulair.  She feels well.  She has been social distancing.  She has had her Covid vaccine but will have a booster next week.  She will have a flu shot today but at the Coumadin clinic.  There are no active complaints.  She is interested in consolidating her Spiriva and Breo and to Trelegy.  She denies any recent exacerbations.   CAT Score 07/14/2020 01/22/2020  Total CAT Score 6 9     No flowsheet data found.   No flowsheet data found.      OV 02/14/2022  Subjective:  Patient ID: [name], female , DOB: 25-Nov-1945 , age 32 y.o. , MRN: [id number] , ADDRESS: [place] [id number] PCP Diane Bush, MD Patient Care Team: Diane Bush, MD as PCP - General (Family Medicine) Sanda Klein, MD as PCP - Cardiology (Cardiology) [name], MD as Consulting Physician (Gastroenterology) Kyung Rudd, MD as Consulting  Physician (Radiation Oncology) Nira Retort, MD as Consulting Physician (Hematology and Oncology) Terance Ice, MD (Inactive) as Consulting Physician (Cardiology) Brand Males, MD as Consulting Physician (Pulmonary Disease) Stark Klein, MD (General Surgery) Sanda Klein, MD as Consulting Physician (Cardiology) Shon Hough, MD as Consulting Physician (Ophthalmology) Martinique, Amy, MD as Consulting Physician (Dermatology) Pieter Partridge, DO as Consulting Physician (Neurology)  This Provider for this visit: Treatment Team:  Attending Provider: Brand Males, MD    02/14/2022 -   Chief Complaint  Patient presents with   Follow-up    Pt is here following up after recent CT. Pt states she has been doing okay since last visit.   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 Multiple lung nodules with waxing and waning quality  -last CT scanApril 2023 and Nov 2022  COVID-19 in February 2023  HPI [name] 77 y.o. -presents for follow-up to discuss her CT results.  Shortly after the CT scan was done she was alarmed with the results.  She is really  worried she has cancer.  Did discuss with her the option of coming here for follow-up today versus getting a surgical appointment.  She opted to take surgical appointment.  I reviewed the CT scan visualized with her.  This looks like waxing and waning nodules.  In between February 2020 she also had COVID-19.  The images are not classic for COVID but did indicate to her the infiltrates could be worse because of COVID.  She has appointment Dr. Roxan Hockey tomorrow.  She is really worried about malignancy.  I did indicate to her that the nodules may not be amenable for biopsy or she might be considered high risk for biopsy.  We discussed the option of canceling appointment Dr. Roxan Hockey tomorrow but she is decided to keep it.  Discussed doing some work-up for the  nodules that are noninvasive such as checking for immunoglobulins and autoimmune profile and QuantiFERON gold.  She is open to this idea.  She is very anxious about this illness because she does live alone.  Husband passed away and one of her sons passed away.  She has another son who is involved in her care  Overall from a symptom standpoint she feels well.  She takes her inhalers and nebulizers correctly.   She is also worried about the 4.2 cm ascending thoracic aortic aneurysm.  She believes this is new.  I did indicate that she could discussed with Dr. Roxan Hockey but did indicate that according to radiology has been stable.  I believe her cardiologist follows her for this.    CT Chest data 01/20/22  Narrative & Impression  CLINICAL DATA:  Pulmonary nodules. COPD. Bronchitis. Personal history of rectal cancer in 2012.   EXAM: CT CHEST WITHOUT CONTRAST   TECHNIQUE: Multidetector CT imaging of the chest was performed following the standard protocol without IV contrast.   RADIATION DOSE REDUCTION: This exam was performed according to the departmental dose-optimization program which includes automated exposure control, adjustment of the mA and/or kV according to patient size and/or use of iterative reconstruction technique.   COMPARISON:  08/16/2021   FINDINGS: Cardiovascular: Dual lead pacer noted. Coronary, aortic arch, and branch vessel atherosclerotic vascular disease. Marked mitral valve calcification. Substantial aortic valve calcification. Prosthetic mitral and aortic valves. Mild cardiomegaly.   Ascending aortic aneurysm 4.2 cm in diameter on image 83 series 2, stable. There is also a prominent main pulmonary artery suggesting pulmonary arterial hypertension.   Mediastinum/Nodes: Lower right paratracheal lymph node 1.1 cm in short axis on image 64 series 2 (mildly enlarged), formerly 0.9 cm.   Lungs/Pleura: Trace right pleural effusion similar to prior. Right greater  than left apical pleuroparenchymal scarring. Similar appearance of confluent bandlike density anteriorly in the right upper lobe on image 55 series 3. Scattered tree-in-bud nodularity anteriorly in both upper lobes favoring atypical infectious bronchiolitis. New 5 by 4 mm right middle lobe nodule on image 99 series 3. New clustered bandlike nodularity inferiorly in the right middle lobe, image 118 series 3.   New bandlike densities and nodularity in both lower lobes along the diaphragms as shown on image 118 series 3. Most of this has a complex bandlike confluent morphology although there are some smaller areas of nodularity. A 7 by 5 mm right lower lobe nodule on image 121 of series 3 was previously 5 by 5 mm.   Continued bandlike scarring in the lingula. New branching nodularity in the superior segment left lower lobe.   Cylindrical bronchiectasis in the right lower lobe.  Upper Abdomen: Abdominal aortic and systemic atherosclerotic calcification. Clips along the gallbladder fossa suggest prior cholecystectomy.   Musculoskeletal: Right rib deformities from old healed rib fractures. Thoracic spondylosis with multilevel bridging spurring. Chronic anterior wedge compression fracture at L1 especially along the inferior endplate.   IMPRESSION: 1. Generally worsened scattered nodularity and bandlike opacities in the lungs superimposed on a background of scattered tree-in-bud reticulonodular opacities. 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 In this setting of scattered nodularity, high specificity with regard to malignant versus infectious etiology of any individual nodule is not feasible on noncontrast CT. Surveillance is suggested. 2. Marked atherosclerosis. 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 Mild cardiomegaly. 3. Ascending thoracic aortic aneurysm 4.2 cm in diameter,  stable. Recommend annual imaging followup by CTA or MRA. This recommendation follows 2010 ACCF/AHA/AATS/ACR/ASA/SCA/SCAI/SIR/STS/SVM Guidelines for the Diagnosis and Management of Patients with Thoracic Aortic Disease. Circulation. 2010; [id number]. Aortic aneurysm NOS (ICD10-I71.9) 4. Stable trace right pleural effusion. 5. Prominent main pulmonary artery compatible pulmonary arterial hypertension. 6. Cylindrical bronchiectasis in the right lower lobe.     Electronically Signed   By: Van Clines [name]   On: 01/21/2022 08:41        OV 05/24/2022  Subjective:  Patient ID: [name], female , DOB: 15-Aug-1946 , age 39 y.o. , MRN: [id number] , ADDRESS: [place] [id number] PCP Diane Bush, MD Patient Care Team: Diane Bush, MD as PCP - General (Family Medicine) Sanda Klein, MD as PCP - Cardiology (Cardiology) [name], MD as Consulting Physician (Gastroenterology) Kyung Rudd, MD as Consulting Physician (Radiation Oncology) Nira Retort, MD as Consulting Physician (Hematology and Oncology) Terance Ice, MD (Inactive) as Consulting Physician (Cardiology) Brand Males, MD as Consulting Physician (Pulmonary Disease) Stark Klein, MD (General Surgery) Sanda Klein, MD as Consulting Physician (Cardiology) Shon Hough, MD as Consulting Physician (Ophthalmology) Martinique, Amy, MD as Consulting Physician (Dermatology) Pieter Partridge, DO as Consulting Physician (Neurology)  This Provider for this visit: Treatment Team:  Attending Provider: Brand Males, MD    05/24/2022 -   Chief Complaint  Patient presents with   Follow-up    Pt is here today to discuss results of recent CT.     HPI [name] 77 y.o. -waxing and waning pulmonary nodules she had another CT scan that shows the same.  Currently feels relatively stable.  Respiratory status is stable for her COPD.  She did see Dr.  Roxan Hockey May 2023 and he felt biopsy would be nondiagnostic and recommended serial expectant approach.  She did have a most recent CT scan as documented below and it shows continued waxing and waning nodules.  Incidentally she was found to have positive ANA when I tested her for etiology of the nodules.  With strongly positive at 1: 1280.  She has upcoming appointment Dr. Vernelle Emerald in rheumatology.  I have asked her to keep it.  Indicated to her the nodules could be related to the positive ANA but the positive ANA could also be post-COVID.  CLINICAL DATA:  Multiple lung nodules.   EXAM: CT CHEST WITHOUT CONTRAST   TECHNIQUE: Multidetector CT imaging of the chest was performed following the standard protocol without IV contrast.   RADIATION DOSE REDUCTION: This exam was performed according to the departmental dose-optimization program which includes automated exposure control, adjustment of the mA and/or kV according to patient size and/or use of iterative reconstruction technique.  COMPARISON:  CT 01/20/2022   FINDINGS: Cardiovascular: Coronary artery calcification and aortic atherosclerotic calcification. Dense aortic and mitral valvular calcification. Valvular prosthetics. No pericardial fluid. Midline sternotomy   Mediastinum/Nodes: No axillary or supraclavicular adenopathy. No mediastinal or hilar adenopathy. No pericardial fluid. Esophagus normal.   Lungs/Pleura: Branching tree-in-bud pattern in the medial RIGHT middle lobe and medial lingula unchanged. Nodule in the RIGHT upper lobe anterior to the fissure measuring 7 mm (68/series 3) is new from prior. Interval resolution of previous seen nodule in the apical segment of the LEFT lower lobe.   New nodule in the RIGHT lower lobe measuring 5 mm image 95/3). Resolution of peripheral nodule in the RIGHT lower lobe seen on comparison exam measuring 7 mm that time   Upper Abdomen: Limited view of the liver, kidneys,  pancreas are unremarkable. Normal adrenal glands.   Musculoskeletal: No aggressive osseous lesion.   IMPRESSION: 1. Waxing waning bilateral pulmonary nodules suggest chronic pulmonary infection/atypical infection. 2. Branching nodular pattern within the medial upper lobes also consistent with atypical pulmonary infection. 3. Dense valvular calcifications with valvular prosthetics.     Electronically Signed   By: Suzy Bouchard [name]   On: 05/13/2022 15:59    OV 12/02/2022  Subjective:  Patient ID: [name], female , DOB: 1946/08/22 , age 77 y.o. , MRN: [id number] , ADDRESS: [place] [id number] PCP Diane Bush, MD Patient Care Team: Diane Bush, MD as PCP - General (Family Medicine) Sanda Klein, MD as PCP - Cardiology (Cardiology) [name], MD as Consulting Physician (Gastroenterology) Kyung Rudd, MD as Consulting Physician (Radiation Oncology) Nira Retort, MD as Consulting Physician (Hematology and Oncology) Terance Ice, MD (Inactive) as Consulting Physician (Cardiology) Brand Males, MD as Consulting Physician (Pulmonary Disease) Stark Klein, MD (General Surgery) Sanda Klein, MD as Consulting Physician (Cardiology) Shon Hough, MD as Consulting Physician (Ophthalmology) Martinique, Amy, MD as Consulting Physician (Dermatology) Pieter Partridge, DO as Consulting Physician (Neurology)  This Provider for this visit: Treatment Team:  Attending Provider: Brand Males, MD    12/02/2022 -   Chief Complaint  Patient presents with   Follow-up    Pt states she has been doing pretty well   Advanced COPD on Spiriva and Breo and Singulair   -Has associated right lower lobe bronchiectasis. Multiple lung nodules with waxing and waning quality  -last CT scanApril 2023 and Nov 2022 and August 2023  COVID-19 in February 2023 in January 2024.  Strongly positive ANA in spring 2023  -Reassured  rheumatology consultation with Dr. Laurance Flatten 2023.  Significant chronic back pain  HPI [name] 77 y.o. -returns for follow-up.  She has COPD.  She continues to be stable with her COPD.  She is on albuterol as needed along with DuoNeb and Singulair and Trelegy.  Respiratory wise she is stable.  August 2023 she did have CT scan of the chest that shows the lung nodules continue to have a waxing and waning quality.  She prefers to have repeat CT scan because her father died for lung cancer and she is worried about this.  We took a shared decision making to have the neck CT scan in August 2024 which would be a 1 year follow-up.  She is agreeable to this.  She had a repeat COVID-19 in January 2024.  Was mild.  She took antiviral molnupiravir.  The main issue for her is that she is having severe chronic back pain.  She says she  is needs to be sitting down or take tramadol.  Previous Pilates does not help.  She has seen Dr. Ellene Route for this.  Also review of the external records indicate that in December 2023 she had hospitalization for memory issues.  Apparently lab work was normal.  I did review Dr. Noni Saupe admission    Simple office walk 185 feet x  3 laps goal with forehead probe 12/02/2022    O2 used ra   Number laps completed 2 of 3   Comments about pace slow   Resting Pulse Ox/HR 97% and 87/min   Final Pulse Ox/HR 95% and 97/min   Desaturated </= 88% no   Desaturated <= 3% points no   Got Tachycardic >/= 90/min yes   Symptoms at end of test bACK pain and stopped   Miscellaneous comments x       PFT     Latest Ref Rng & Units 03/13/2018   10:36 AM 04/01/2016   10:10 AM 11/29/2013    4:04 PM  PFT Results  FVC-Pre L 2.21  2.22  1.49   FVC-Predicted Pre % 70  68  42   FVC-Post L  2.33  1.60   FVC-Predicted Post %  72  45   Pre FEV1/FVC % % 64  61  63   Post FEV1/FCV % %  63  65   FEV1-Pre L 1.42  1.35  0.93   FEV1-Predicted Pre % 59  55  34   FEV1-Post L  1.47   1.05   DLCO uncorrected ml/min/mmHg 24.55  19.21    DLCO UNC% % 91  71    DLCO corrected ml/min/mmHg 22.70     DLCO COR %Predicted % 84     DLVA Predicted % 102  95    TLC L  5.00    TLC % Predicted %  93    RV % Predicted %  121         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 (12/31/2016), Acute cystitis without hematuria (12/22/2015), Acute right-sided low back pain without sciatica (11/26/2019), Adjustment disorder with depressed mood (10/15/2014), Anemia, Anticoagulant long-term use (06/14/2019), Aortic atherosclerosis (03/03/2022), Arthritis, Arthropathy of spinal facet joint (03/08/2018), Ascending aortic aneurysm (03/02/2021), Bilateral hip pain (09/08/2016), CAP (community acquired pneumonia) (02/08/2016), Carotid stenosis (12/08/2020), Cataracts, bilateral, Chest pain (08/25/2014), Chronic cholecystitis with calculus (01/21/2015), Chronic diastolic heart failure (0000000), Chronic insomnia, Chronic lower back pain (03/05/2008), CKD (chronic kidney disease) stage 2, GFR 60-89 ml/min (07/03/2015), Constipation, COPD (chronic obstructive pulmonary disease), DDD (degenerative disc disease), DISH (diffuse idiopathic skeletal hyperostosis) (05/14/2019), Dyspnea on exertion (06/05/2015), Essential hypertension, benign (03/24/2009), GERD (gastroesophageal reflux disease), Hematuria (06/17/2021), History of radiation therapy (05/30/11 to 07/07/11), History of rectal cancer (05/26/2011), History of shingles, History of vertebral fracture (11/26/2019), HLD (hyperlipidemia) (05/03/2007), Hyperlipemia, Lacunar infarction, Left hip pain (07/29/2020), Legally blind in left eye, Lung nodule, 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), Macrocytosis (03/27/2022), Malaise and fatigue (01/22/2018), Meralgia paresthetica (12/13/2007), 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), Obesity, Osteopenia (12/2014), Other spondylosis with radiculopathy, lumbar region (07/17/2018), Pain of right  sacroiliac joint (08/09/2017), Pancreatitis (11/2014), Peripheral neuropathy (01/03/2022), Persistent atrial fibrillation (12/09/2020), Personal history of colonic polyps, PONV (postoperative nausea and vomiting), Presence of permanent cardiac pacemaker, 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), 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), Pulmonary artery hypertension (06/17/2021), Pulmonary infiltrates (04/23/2019), Pulmonary nodule (02/23/2012), Rheumatic heart disease mitral stenosis, Right leg swelling (06/11/2020), 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   reports that she quit smoking about 47 years ago. Her smoking use included cigarettes. She has a 15.00 pack-year smoking history. She has never used smokeless tobacco.  Past Surgical History:  Procedure Laterality Date   ANTERIOR LAT LUMBAR FUSION Left 07/17/2018   Procedure: Lumbar Two-Three Lumbar Three-Four Anterolateral decompression/interbody fusion with lateral plate fixation/Infuse;  Surgeon: Kristeen Miss, MD;  Location: [facility];  Service: Neurosurgery;  Laterality: Left;  Lumbar Two-Three Lumbar Three-Four Anterolateral decompression/interbody fusion with lateral plate fixation/Infuse   AORTIC VALVE REPLACEMENT N/A 12/12/2013   Procedure: AORTIC VALVE REPLACEMENT (AVR);  Surgeon: Gaye Pollack, MD; Service: Open Heart Surgery   BACK SURGERY  2006, 2007   2006 SPACER, 2007 decompression and fusion L4/5   BOWEL RESECTION  04/02/2012   Procedure: SMALL  BOWEL RESECTION;  Surgeon: Stark Klein, MD;  Location: [facility];  Service: General;  Laterality: N/A;   BRONCHIAL WASHINGS  03/24/2021   Procedure: BRONCHIAL WASHINGS;  Surgeon: Brand Males, MD;  Location: [facility];  Service: Endoscopy;;   CARPAL TUNNEL RELEASE Bilateral    CATARACT EXTRACTION W/ INTRAOCULAR LENS IMPLANT Left 10/18/2016   CATARACT EXTRACTION W/ INTRAOCULAR LENS IMPLANT Right 12/13/2016   Dr. Kathrin Penner   CHOLECYSTECTOMY N/A 01/21/2015   chronic cholecystitis, Stark Klein, MD   COLON RESECTION  08/25/2011   Procedure: COLON RESECTION LAPAROSCOPIC;  Surgeon: Stark Klein, MD;  Location: [facility];  Service: General;  Laterality: N/A;  Laparoscopic Assisted Low Anterior Resection Diverting Ostomy and onQ pain pump   COLONOSCOPY  03/2013   1 polyp, rpt 3 yrs Ardis Hughs)   COLONOSCOPY  05/2021   diverticulosis and patent colon anastomosis with some edema/narrowing, rpt 5 yrs Ardis Hughs)   COLONOSCOPY WITH PROPOFOL N/A 06/09/2016   patent colo-colonic anastomosis, rpt 5 yrs Ardis Hughs)   EP IMPLANTABLE DEVICE N/A 06/16/2016   Procedure: Pacemaker Implant;  Surgeon: Will Meredith Leeds, MD;  Location: [facility];  Service: Cardiovascular;  Laterality: N/A;   FOOT SURGERY  left foot   hammer toe   ILEOSTOMY  08/25/2011   ILEOSTOMY CLOSURE  04/02/2012   Procedure: ILEOSTOMY TAKEDOWN;  Surgeon: Stark Klein, MD;  [facility]: [facility];  Service: General;  Laterality: N/A;   INTRAOPERATIVE TRANSESOPHAGEAL ECHOCARDIOGRAM N/A 12/12/2013   Procedure: INTRAOPERATIVE TRANSESOPHAGEAL ECHOCARDIOGRAM;  Surgeon: Gaye Pollack, MD;  Location: [facility];  Service: Open Heart Surgery;  Laterality: N/A;   LEFT AND RIGHT HEART CATHETERIZATION WITH CORONARY ANGIOGRAM N/A 11/27/2013   Procedure: LEFT AND RIGHT HEART CATHETERIZATION WITH CORONARY ANGIOGRAM;  Surgeon: Sanda Klein, MD;  Location: [facility];  Service: Cardiovascular;  Laterality: N/A;   MITRAL VALVE REPLACEMENT N/A 12/12/2013    Procedure: MITRAL VALVE (MV) REPLACEMENT OR REPAIR;  Surgeon: Gaye Pollack, MD; Service: Open Heart Surgery   TEE WITHOUT CARDIOVERSION N/A 11/29/2013   Procedure: TRANSESOPHAGEAL ECHOCARDIOGRAM (TEE);  Surgeon: Sueanne Margarita, MD;  Location: [facility];  Service: Cardiovascular;  Laterality: N/A;   TEE WITHOUT CARDIOVERSION N/A 06/04/2019   Procedure: TRANSESOPHAGEAL ECHOCARDIOGRAM (TEE);  Surgeon: Sanda Klein, MD;  Location: [facility];  Service: Cardiovascular;  Laterality: N/A;   TONSILLECTOMY     VIDEO BRONCHOSCOPY N/A 03/24/2021   Procedure: VIDEO BRONCHOSCOPY WITHOUT  FLUORO;  Surgeon: Brand Males, MD;  Location: [facility];  Service: Endoscopy;  Laterality: N/A;  SMALL SCOPE,PREMEDICATE WITH NEBULIZED LIDOCAINE    Allergies  Allergen Reactions   Codeine Nausea Only   Sulfa Antibiotics Nausea Only   Sulfonamide Derivatives Nausea Only    Immunization History  Administered Date(s) Administered   COVID-19, mRNA, vaccine(Comirnaty)12 years and older 08/15/2022   Fluad Quad(high Dose 65+) 07/11/2019, 07/14/2020, 07/25/2022   H1N1 09/17/2008   Influenza Split 07/18/2012   Influenza Whole 08/10/2005, 06/24/2009, 07/11/2011   Influenza, High Dose Seasonal PF 07/04/2016, 07/04/2017, 07/27/2021   Influenza,inj,Quad PF,6+ Mos 07/09/2018   Influenza-Unspecified 07/24/2013, 06/30/2014, 06/30/2015   Moderna Covid-19 Vaccine Bivalent Booster 54yr & up 07/02/2021   Moderna SARS-COV2 Booster Vaccination 08/14/2020   Moderna Sars-Covid-2 Vaccination 02/26/2021   PFIZER(Purple Top)SARS-COV-2 Vaccination 10/30/2019, 11/20/2019   Pneumococcal Conjugate-13 06/02/2014   Pneumococcal Polysaccharide-23 08/10/2005, 04/21/2008, 06/25/2013   Respiratory Syncytial Virus Vaccine,Recomb Aduvanted(Arexvy) 07/13/2022   Td 10/11/2005   Zoster Recombinat (Shingrix) 06/25/2018, 08/28/2018   Zoster, Live 09/16/2014    Family History  Problem Relation Age of Onset   Heart disease Mother     Diabetes Mother    Hypertension Mother    Stroke Mother    Lung cancer Father    Breast cancer Maternal Aunt 83   Heart attack Maternal Grandfather    CAD Son 353  Heart attack Son 368  Rectal cancer Neg Hx    Stomach cancer Neg Hx    Esophageal cancer Neg Hx    Colon cancer Neg Hx      Current Outpatient Medications:    acetaminophen (TYLENOL) 500 MG tablet, Take 2 tablets (1,000 mg total) by mouth daily., Disp: , Rfl:    albuterol (VENTOLIN HFA) 108 (90 Base) MCG/ACT inhaler, Inhale 2 puffs into the lungs every 6 (six) hours as needed for wheezing., Disp: 18 g, Rfl: 6   apixaban (ELIQUIS) 5 MG TABS tablet, TAKE 1 TABLET(5 MG) BY MOUTH TWICE DAILY, Disp: 60 tablet, Rfl: 5   ascorbic acid (VITAMIN C) 500 MG tablet, Take 1 tablet (500 mg total) by mouth daily., Disp: , Rfl:    Biotin 1000 MCG tablet, Take 1,000 mcg by mouth daily., Disp: , Rfl:    buPROPion ER (WELLBUTRIN SR) 100 MG 12 hr tablet, Take 1 tablet (100 mg total) by mouth every morning., Disp: 90 tablet, Rfl: 2   calcium carbonate (OSCAL) 1500 (600 Ca) MG TABS tablet, Take 600 mg of elemental calcium by mouth daily., Disp: , Rfl:    carboxymethylcellulose (REFRESH PLUS) 0.5 % SOLN, Place 1 drop into both eyes 3 (three) times daily as needed (dry eyes)., Disp: , Rfl:    Cholecalciferol (VITAMIN D) 50 MCG (2000 UT) tablet, Take 2,000 Units by mouth daily., Disp: , Rfl:    docusate sodium (COLACE) 100 MG capsule, Take 200 mg by mouth every evening., Disp: , Rfl:    furosemide (LASIX) 40 MG tablet, Take 1 tablet (40 mg total) by mouth daily. May take an extra tablet daily as needed for swelling., Disp: 135 tablet, Rfl: 2   gabapentin (NEURONTIN) 300 MG capsule, Take 300 mg by mouth at bedtime., Disp: , Rfl:    ipratropium-albuterol (DUONEB) 0.5-2.5 (3) MG/3ML SOLN, Take 3 mLs by nebulization every 6 (six) hours as needed., Disp: 360 mL, Rfl: 0   lovastatin (MEVACOR) 40 MG tablet, TAKE 1 TABLET(40 MG) BY MOUTH AT BEDTIME, Disp:  90 tablet, Rfl: 1   montelukast (SINGULAIR) 10  MG tablet, TAKE 1 TABLET(10 MG) BY MOUTH AT BEDTIME, Disp: 90 tablet, Rfl: 1   Multiple Vitamins-Minerals (PRESERVISION AREDS 2 PO), Take 2 capsules by mouth every evening. , Disp: , Rfl:    polyethylene glycol (MIRALAX / GLYCOLAX) packet, Take 17 g by mouth daily., Disp: 14 each, Rfl: 0   potassium chloride (KLOR-CON) 10 MEQ tablet, TAKE 2 TABLETS(20 MEQ) BY MOUTH DAILY, Disp: 180 tablet, Rfl: 3   predniSONE (DELTASONE) 20 MG tablet, Take 2 tablets (40 mg total) by mouth daily., Disp: 14 tablet, Rfl: 0   silver sulfADIAZINE (SILVADENE) 1 % cream, Apply 1 application topically daily., Disp: 50 g, Rfl: 1   traMADol (ULTRAM) 50 MG tablet, Take 1 tablet (50 mg total) by mouth 2 (two) times daily as needed., Disp: 60 tablet, Rfl: 3   traZODone (DESYREL) 50 MG tablet, TAKE 1/2 TO 1 TABLET(25 TO 50 MG) BY MOUTH AT BEDTIME, Disp: 30 tablet, Rfl: 6   TRELEGY ELLIPTA 200-62.5-25 MCG/ACT AEPB, INHALE 1 PUFF INTO THE LUNGS DAILY, Disp: 60 each, Rfl: 11   triamcinolone cream (KENALOG) 0.1 %, Apply 1 Application topically 2 (two) times daily., Disp: , Rfl:    Vibegron (GEMTESA) 75 MG TABS, Take 75 mg by mouth daily., Disp: , Rfl:    vitamin B-12 (CYANOCOBALAMIN) 500 MCG tablet, Take 1 tablet (500 mcg total) by mouth 2 (two) times a week., Disp: , Rfl:    Zinc 50 MG CAPS, Take 1 capsule (50 mg total) by mouth daily., Disp: , Rfl: 0   amoxicillin (AMOXIL) 500 MG capsule, TAKE 4 CAPSULES(2000 MG) BY MOUTH 1 TIME BEFORE DENTAL WORK FOR 1 DOSE, Disp: 4 capsule, Rfl: 1 No current facility-administered medications for this visit.  Facility-Administered Medications Ordered in Other Visits:    ondansetron (ZOFRAN) 4 mg in sodium chloride 0.9 % 50 mL IVPB, 4 mg, Intravenous, Q6H PRN, Kristeen Miss, MD      Objective:   Vitals:   12/02/22 1406  BP: 124/66  Pulse: 89  Temp: 97.9 F (36.6 C)  TempSrc: Oral  SpO2: 94%  Weight: 150 lb 9.6 oz (68.3 kg)  Height: 5'  4" (1.626 m)    Estimated body mass index is 25.85 kg/m as calculated from the following:   Height as of this encounter: 5' 4"$  (1.626 m).   Weight as of this encounter: 150 lb 9.6 oz (68.3 kg).  @WEIGHTCHANGE$ @  [employer]   12/02/22 1406  Weight: 150 lb 9.6 oz (68.3 kg)     Physical Exam    General: No distress. Looks well but frail and deconditoned Neuro: Alert and Oriented x 3. GCS 15. Speech normal Psych: Pleasant Resp:  Barrel Chest - yes mild.  Wheeze - no, Crackles - no, No overt respiratory distress CVS: Normal heart sounds. Murmurs - YES Ext: Stigmata of Connective Tissue Disease - no HEENT: Normal upper airway. PEERL +. No post nasal drip        Assessment:       ICD-10-CM   1. Moderate COPD (chronic obstructive pulmonary disease) (HCC)  J44.9     2. Multiple lung nodules on CT  R91.8          Plan:     Patient Instructions  Moderate COPD (chronic obstructive pulmonary disease) ([place])  - stable with minimal symptmns  Plan  - continue current bronchodilatory regimen and medicines  Multiple lung nodules on CT with RLL bronchiectasis Pseudomonas aeruginosa colonization - June 2022 History of covid-19 in Feb 2023  and Jan 2024    - the CT features Augst 2023 shows  waxing and waning quality since April 2023. -No indication for biopsy as determined by Dr. Roxan Hockey May 2023 -This could be low-grade infection or non specific inflammation - [facility] consult with Dr Benjamine Mola in 2023 was negative -Features do not fit in with cancer  Plan - next CT scan of the chest without contrast in August 2024 prior  to next follow-up     Follow-up - Return to see Dr. [name] in 6 months or sooner if needed but after CT chest    SIGNATURE    Dr. Brand Males, [name], F.C.C.P,  Pulmonary and Critical Care Medicine Staff Physician, [facility] Director - Interstitial Lung Disease  Program  Pulmonary [place] [facility] [place], [place], [address]  Pager: [phone], If no answer or between  15:00h - 7:00h: call [phone] Telephone: [phone]  2:40 PM 12/02/2022

## 2022-12-02 NOTE — Patient Instructions (Addendum)
Moderate COPD (chronic obstructive pulmonary disease) (HCC)  - stable with minimal symptmns  Plan  - continue current bronchodilatory regimen and medicines  Multiple lung nodules on CT with RLL bronchiectasis Pseudomonas aeruginosa colonization - June 2022 History of covid-19 in Feb 2023 and Jan 2024    - the CT features Augst 2023 shows  waxing and waning quality since April 2023. -No indication for biopsy as determined by Dr. Roxan Hockey May 2023 -This could be low-grade infection or non specific inflammation - [facility] consult with Dr Benjamine Mola in 2023 was negative -Features do not fit in with cancer  Plan - next CT scan of the chest without contrast in August 2024 prior  to next follow-up     Follow-up - Return to see Dr. [name] in 6 months or sooner if needed but after CT chest

## 2022-12-10 ENCOUNTER — Other Ambulatory Visit: Payer: Self-pay | Admitting: Cardiovascular Disease

## 2022-12-12 NOTE — Telephone Encounter (Signed)
Prescription refill request for Eliquis received. Indication: AF,MVR,AVR Last office visit: 09/12/22  M Crotioru MD Scr: 0.80 on 10/09/22  Epic Age: 77 Weight: 67.5kg  Based on above findings Eliquis '5mg'$  twice daily is the appropriate dose.  Refill approved.

## 2022-12-14 ENCOUNTER — Ambulatory Visit (INDEPENDENT_AMBULATORY_CARE_PROVIDER_SITE_OTHER): Payer: Medicare Other | Admitting: Clinical

## 2022-12-14 DIAGNOSIS — F4323 Adjustment disorder with mixed anxiety and depressed mood: Secondary | ICD-10-CM

## 2022-12-14 NOTE — Progress Notes (Signed)
[facility] Counselor/Therapist Progress Note  Patient ID: [name], MRN: [id number],    Date: 12/14/2022  Time Spent: 10:32am - 11:23am : 53 minutes   Treatment Type: Individual Therapy  Reported Symptoms: Patient reported difficulty recalling information at times   Mental Status Exam: Appearance:  Well Groomed     Behavior: Appropriate  Motor: Normal  Speech/Language:  Clear and Coherent  Affect: Appropriate  Mood: Patient stated, "so so" in response to current mood  Thought process: normal  Thought content:   WNL  Sensory/Perceptual disturbances:   WNL  Orientation: oriented to person, place, and situation  Attention: Good  Concentration: Good  Memory: Patient reported difficulty recalling information at times  Fund of knowledge:  Good  Insight:   Good  Judgment:  Good  Impulse Control: Good   Risk Assessment: Danger to Self:  No Patient denied current suicidal ideation  Self-injurious Behavior: No Danger to Others: No Patient denied current homicidal ideation  Duty to Warn:no Physical Aggression / Violence:No  Access to Firearms a concern: No  Gang Involvement:No   Subjective: Patient stated, "so so" in response to mood since last session and current mood. Patient reported she followed up with a urogynecologist to obtain a second opinion. Patient reported she spoke with her son and let her son know she does not want to move to his home. Patient stated, "he was ok" in response to the conversation. Patient reported feeling unhappy and anxious during a recent basketball game when the team was not playing well and reported the game impacted her mood for several hours afterwards. Patient reported feeling angry when her cat unrolled the toilet paper and reported thoughts related to how she is going to change the cat's behavior. Patient reported she received a message from her alarm company and feared someone had broken into her home. Patient reported  thoughts that someone was stealing her belongings and fear that something would happen to her cat in response to the message. Patient reported she previously went on a trip to [place] with a group but can not recall any memories associated with the trip. Patient reported she is aware of the trip due to the information on her calendar. In addition, patient reported she participated in a trip to [place] with a group but can not recall any memories associated with the trip.  Patient reported others have indicated patient attended both trips.    Interventions: Cognitive Behavioral Therapy. Clinician conducted session in person at clinician's office at [facility].  Reviewed events since last session and status of recent stressors. Reviewed patient's thought record. Discussed strategies to address stress related to patient's cat's behaviors, such as, seeking advice from an Medical laboratory scientific officer. Provided psycho education related to thought distortions, such as, catastrophizing. Explored triggers for changes in mood and impact on patient's mood. Provided psycho education related to symptoms of depression and grief. Discussed changes in patient's memory. Discussed patient following up with her primary care provider to discuss additional changes in patient's memory and patient's concerns associated with changes in memory. Clinician requested patient continue thought record for homework.    Diagnosis:  Adjustment disorder with mixed anxiety and depressed mood      Plan: Patient is to utilize [employer] [facility], thought re-framing, relaxation techniques, mindfulness and coping strategies to decrease symptoms associated with Adjustment Disorder.  Frequency: monthly  Modality: individual      Long-term goal:   Reduce overall level, frequency, and intensity of the symptoms of  depression and anxiety as evidenced by decreased appetite, improvement in sleep, increase in energy, decreased depressed  mood, decrease in changes in memory, decreased feelings of loneliness, decreased feelings of anxiety and worry per patient report for at least 3 consecutive months.   Target Date: 10/20/23  Progress: progressing    Short-term goal:  Process grief as it relates to the loss of patient's husband and son and develop/implement healthy coping skills to utilize in response  Target Date: 05/16/23  Progress: progressing   Katherina Right, LCSW

## 2022-12-14 NOTE — Progress Notes (Signed)
                Gracielynn Birkel, LCSW 

## 2022-12-16 ENCOUNTER — Other Ambulatory Visit: Payer: Self-pay | Admitting: Family Medicine

## 2022-12-19 NOTE — Telephone Encounter (Signed)
Too soon. Rx sent on 11/11/22, #90/2 to [facility]   Request denied.

## 2023-01-01 ENCOUNTER — Other Ambulatory Visit: Payer: Self-pay | Admitting: Family Medicine

## 2023-01-02 ENCOUNTER — Other Ambulatory Visit: Payer: Self-pay | Admitting: *Deleted

## 2023-01-02 MED ORDER — POTASSIUM CHLORIDE ER 10 MEQ PO TBCR: EXTENDED_RELEASE_TABLET | ORAL | 1 refills | Status: DC

## 2023-01-10 ENCOUNTER — Ambulatory Visit (INDEPENDENT_AMBULATORY_CARE_PROVIDER_SITE_OTHER): Payer: Medicare Other | Admitting: Clinical

## 2023-01-10 DIAGNOSIS — F4323 Adjustment disorder with mixed anxiety and depressed mood: Secondary | ICD-10-CM | POA: Diagnosis not present

## 2023-01-10 NOTE — Progress Notes (Signed)
[facility] Counselor/Therapist Progress Note  Patient ID: [name], MRN: [id number],    Date: 01/10/2023  Time Spent: 9:34am - 10:34am : 60 minutes  Treatment Type: Individual Therapy  Reported Symptoms: Patient stated, "its ok" in response to mood since last session.   Mental Status Exam: Appearance:  Well Groomed     Behavior: Appropriate  Motor: Normal  Speech/Language:  Clear and Coherent  Affect: Appropriate  Mood: normal  Thought process: normal  Thought content:   WNL  Sensory/Perceptual disturbances:   WNL  Orientation: oriented to person, place, and situation  Attention: Good  Concentration: Good  Memory: Patient reported difficulty recalling information at times  Fund of knowledge:  Good  Insight:   Fair  Judgment:  Good  Impulse Control: Good   Risk Assessment: Danger to Self:  No Patient denied current suicidal ideation  Self-injurious Behavior: No Danger to Others: No Patient denied current homicidal ideation  Duty to Warn:no Physical Aggression / Violence:No  Access to Firearms a concern: No  Gang Involvement:No   Subjective: Patient stated, "about the same" in response to symptoms since last session. Patient stated, "its ok" in response to mood since last session.  Patient reported she continues to experience changes in memory. Patient reported a recent incident in which she did not recall she moved a significant amount of money from her bank to another bank and stated, "it scares me". Patient reported she had plans with her son to go to the bank to add her son to her account and reported she did not recall she had previously added her son to her checking account. Patient reported she has a follow up appointment with her primary care provider in May. Patient inquired if clinician can share information with her primary care provider. Patient reported she is concerned she may have experienced some TIA's and reported it feels as if someone  is pulling a shade across her left eye and her vision is dark. Patient stated "most days it's ok" in response to mood. Patient stated, "blah means ok" in response to patient's description of her mood.   Interventions: Cognitive Behavioral Therapy and psycho education . Clinician conducted session in person at clinician's office at [facility]. Reviewed symptoms since last session. Reviewed patient's thought record for homework. Provided supportive therapy, active and reflective listening, and validation as patient discussed her concerns related to changes in memory and visual changes. Provided psycho education related to normal age related changes versus changes associated with Dementias as it relates to changes in memory. Discussed consent for patient's primary care provider and patient following up with primary care provider to discuss patient's concerns related to changes in memory and vision. Clinician requested patient continue thought record for homework.     Diagnosis:  Adjustment disorder with mixed anxiety and depressed mood      Plan: Patient is to utilize [employer] [facility], thought re-framing, relaxation techniques, mindfulness and coping strategies to decrease symptoms associated with Adjustment Disorder.   Frequency: monthly  Modality: individual      Long-term goal:   Reduce overall level, frequency, and intensity of the symptoms of depression and anxiety as evidenced by decreased appetite, improvement in sleep, increase in energy, decreased depressed mood, decrease in changes in memory, decreased feelings of loneliness, decreased feelings of anxiety and worry per patient report for at least 3 consecutive months.   Target Date: 10/20/23  Progress: progressing    Short-term goal:  Process grief as it  relates to the loss of patient's husband and son and develop/implement healthy coping skills to utilize in response   Target Date: 05/16/23  Progress: progressing     Katherina Right, LCSW

## 2023-01-10 NOTE — Progress Notes (Signed)
                Sammy Douthitt, LCSW 

## 2023-01-24 ENCOUNTER — Encounter: Payer: Self-pay | Admitting: Family Medicine

## 2023-01-24 ENCOUNTER — Ambulatory Visit (INDEPENDENT_AMBULATORY_CARE_PROVIDER_SITE_OTHER): Payer: Medicare Other | Admitting: Family Medicine

## 2023-01-24 VITALS — BP 134/72 | HR 93 | Temp 97.7°F | Ht 64.0 in | Wt 147.4 lb

## 2023-01-24 DIAGNOSIS — I83019 Varicose veins of right lower extremity with ulcer of unspecified site: Secondary | ICD-10-CM | POA: Diagnosis not present

## 2023-01-24 DIAGNOSIS — I83029 Varicose veins of left lower extremity with ulcer of unspecified site: Secondary | ICD-10-CM

## 2023-01-24 DIAGNOSIS — R600 Localized edema: Secondary | ICD-10-CM | POA: Diagnosis not present

## 2023-01-24 DIAGNOSIS — L97919 Non-pressure chronic ulcer of unspecified part of right lower leg with unspecified severity: Secondary | ICD-10-CM | POA: Diagnosis not present

## 2023-01-24 DIAGNOSIS — L97929 Non-pressure chronic ulcer of unspecified part of left lower leg with unspecified severity: Secondary | ICD-10-CM

## 2023-01-24 DIAGNOSIS — L97909 Non-pressure chronic ulcer of unspecified part of unspecified lower leg with unspecified severity: Secondary | ICD-10-CM | POA: Insufficient documentation

## 2023-01-24 MED ORDER — HYDROXYZINE HCL 10 MG PO TABS: 10.0000 mg | ORAL_TABLET | Freq: Two times a day (BID) | ORAL | 0 refills | Status: DC | PRN

## 2023-01-24 NOTE — Assessment & Plan Note (Addendum)
Rec leg elevation, good water intake, limit salt intake, discussed compression stockings - she may try OTC compression.  She also continues lasix 40mg  daily.  She is on low dose gabapentin nightly.

## 2023-01-24 NOTE — Progress Notes (Signed)
Ph: [phone]       Fax: [phone]   Patient ID: [name], female    DOB: [birth year], 77 y.o.   MRN: [id number]  This visit was conducted in person.  BP 134/72   Pulse 93   Temp 97.7 F (36.5 C) (Temporal)   Ht  (1.626 m)   Wt 147 lb 6 oz (66.8 kg)   SpO2 94%   BMI 25.30 kg/m    CC: check skin lesions Subjective:   HPI: [name] is a 77 y.o. female presenting on 01/24/2023 for Skin Problem (C/o discolored skin lesions on lower legs and arms. Noticed about 1 mo ago. Areas occasionally break open. )   1 mo h/o discolored lesions to bilateral arms and legs. They can open up and cause significant bleeding.  Denies inciting trauma/injury or falls. She is anticoagulated on eliquis  BID.  Requests Rx for valium for upcoming new dental appointment.      Relevant past medical, surgical, family and social history reviewed and updated as indicated. Interim medical history since our last visit reviewed. Allergies and medications reviewed and updated. Outpatient Medications Prior to Visit  Medication Sig Dispense Refill   acetaminophen (TYLENOL) 500 MG tablet Take 2 tablets (1,000 mg total) by mouth daily.     albuterol (VENTOLIN HFA) 108 (90 Base) MCG/ACT inhaler Inhale 2 puffs into the lungs every 6 (six) hours as needed for wheezing. 18 g 6   amoxicillin (AMOXIL) 500 MG capsule TAKE 4 CAPSULES(2000 MG) BY MOUTH 1 TIME BEFORE DENTAL WORK FOR 1 DOSE 4 capsule 1   apixaban (ELIQUIS) 5 MG TABS tablet TAKE 1 TABLET(5 MG) BY MOUTH TWICE DAILY 180 tablet 1   ascorbic acid (VITAMIN C) 500 MG tablet Take 1 tablet (500 mg total) by mouth daily.     Biotin 1000 MCG tablet Take 1,000 mcg by mouth daily.     buPROPion ER (WELLBUTRIN SR) 100 MG 12 hr tablet Take 1 tablet (100 mg total) by mouth every morning. 90 tablet 2   calcium carbonate (OSCAL) 1500 (600 Ca) MG TABS tablet Take 600 mg of elemental calcium by mouth daily.     carboxymethylcellulose  (REFRESH PLUS) 0.5 % SOLN Place 1 drop into both eyes 3 (three) times daily as needed (dry eyes).     Cholecalciferol (VITAMIN D) 50 MCG (2000 UT) tablet Take 2,000 Units by mouth daily.     docusate sodium (COLACE) 100 MG capsule Take 200 mg by mouth every evening.     furosemide (LASIX) 40 MG tablet Take 1 tablet (40 mg total) by mouth daily. May take an extra tablet daily as needed for swelling. 135 tablet 2   gabapentin (NEURONTIN) 300 MG capsule Take 300 mg by mouth at bedtime.     ipratropium-albuterol (DUONEB) 0.5-2.5 (3) MG/3ML SOLN Take 3 mLs by nebulization every 6 (six) hours as needed. 360 mL 0   lovastatin (MEVACOR) 40 MG tablet TAKE 1 TABLET(40 MG) BY MOUTH AT BEDTIME 90 tablet 1   montelukast (SINGULAIR) 10 MG tablet TAKE 1 TABLET(10 MG) BY MOUTH AT BEDTIME 90 tablet 1   Multiple Vitamins-Minerals (PRESERVISION AREDS 2 PO) Take 2 capsules by mouth every evening.      polyethylene glycol (MIRALAX / GLYCOLAX) packet Take 17 g by mouth daily. 14 each 0   potassium chloride (KLOR-CON) 10 MEQ tablet TAKE 2 TABLETS(20 MEQ) BY MOUTH DAILY 180 tablet 1   predniSONE (DELTASONE) 20 MG tablet Take 2  tablets (40 mg total) by mouth daily. 14 tablet 0   traMADol (ULTRAM) 50 MG tablet Take 1 tablet (50 mg total) by mouth 2 (two) times daily as needed. 60 tablet 3   traZODone (DESYREL) 50 MG tablet TAKE 1/2 TO 1 TABLET(25 TO 50 MG) BY MOUTH AT BEDTIME 30 tablet 6   TRELEGY ELLIPTA 200-62.5-25 MCG/ACT AEPB INHALE 1 PUFF INTO THE LUNGS DAILY 60 each 11   triamcinolone cream (KENALOG) 0.1 % Apply 1 Application topically 2 (two) times daily.     Vibegron (GEMTESA) 75 MG TABS Take 75 mg by mouth daily.     vitamin B-12 (CYANOCOBALAMIN) 500 MCG tablet Take 1 tablet (500 mcg total) by mouth 2 (two) times a week.     Zinc 50 MG CAPS Take 1 capsule (50 mg total) by mouth daily.  0   silver sulfADIAZINE (SILVADENE) 1 % cream Apply 1 application topically daily. 50 g 1   Facility-Administered Medications  Prior to Visit  Medication Dose Route Frequency Provider Last Rate Last Admin   ondansetron (ZOFRAN) 4 mg in sodium chloride 0.9 % 50 mL IVPB  4 mg Intravenous Q6H PRN Barnett Abu, MD         Per HPI unless specifically indicated in ROS section below Review of Systems  Objective:  BP 134/72   Pulse 93   Temp 97.7 F (36.5 C) (Temporal)   Ht  (1.626 m)   Wt 147 lb 6 oz (66.8 kg)   SpO2 94%   BMI 25.30 kg/m   Wt Readings from Last 3 Encounters:  01/24/23 147 lb 6 oz (66.8 kg)  12/02/22 150 lb 9.6 oz (68.3 kg)  11/11/22 146 lb (66.2 kg)      Physical Exam Vitals and nursing note reviewed.  Constitutional:      Appearance: Normal appearance. She is not ill-appearing.  Musculoskeletal:     Right lower leg: Edema (1+) present.     Left lower leg: Edema (1+) present.     Comments: 2+ DP on left, right not checked  Skin:    General: Skin is warm and dry.     Findings: Bruising, ecchymosis and lesion present.     Comments: Ecchymoses to bilateral arms and legs with shallow ulcers to R anterior shin x2, L medial ankle x1 (3 oozing) and dry scab to L lateral ankle at malleolus x1  Neurological:     Mental Status: She is alert.  Psychiatric:        Mood and Affect: Mood normal.        Behavior: Behavior normal.    Wound care: Wounds cleaned with normal saline and gauze  Wounds x3 (2 on anterior R shin, 1 on medial L ankle) dressed with triple abx ointment and covered with tegaderm. Home wound care reviewed      Assessment & Plan:  Rx hydroxyzine low dose PRN anxiety surrounding dental visit, reviewed sedation precautions.   Problem List Items Addressed This Visit     Pedal edema    Rec leg elevation, good water intake, limit salt intake, discussed compression stockings - she may try OTC compression.  She also continues lasix  daily.  She is on low dose gabapentin nightly.       Venous ulcer of leg - Primary    Anticipate venous ulcers as good pulses noted  on left.  Wounds dressed with antibiotic ointment and tegaderm.  Home wound care instructions provided.  Red flags to seek further care  reviewed.  Consider unna boot if needed.  Consider ABIs for further evaluation of arterial circulation.         Meds ordered this encounter  Medications   hydrOXYzine (ATARAX) 10 MG tablet    Sig: Take 1 tablet (10 mg total) by mouth 2 (two) times daily as needed for anxiety (sedation precautions).    Dispense:  10 tablet    Refill:  0    No orders of the defined types were placed in this encounter.   Patient Instructions  Anticipate venous insufficiency of legs leading to developing ulcers.  Elevate legs, avoid salt, drink plenty of water.  May use antibiotic ointment covered by tegaderm with change every few days.  Return if any worsening symptoms.   May try low dose hydroxyzine 10mg  as needed for dental appointment.   Follow up plan: Return if symptoms worsen or fail to improve.  [name], MD

## 2023-01-24 NOTE — Assessment & Plan Note (Addendum)
Anticipate venous ulcers as good pulses noted on left.  Wounds dressed with antibiotic ointment and tegaderm.  Home wound care instructions provided.  Red flags to seek further care reviewed.  Consider unna boot if needed.  Consider ABIs for further evaluation of arterial circulation.

## 2023-01-24 NOTE — Patient Instructions (Addendum)
Anticipate venous insufficiency of legs leading to developing ulcers.  Elevate legs, avoid salt, drink plenty of water.  May use antibiotic ointment covered by tegaderm with change every few days.  Return if any worsening symptoms.   May try low dose hydroxyzine 10mg  as needed for dental appointment.

## 2023-01-27 ENCOUNTER — Ambulatory Visit (INDEPENDENT_AMBULATORY_CARE_PROVIDER_SITE_OTHER): Payer: Medicare Other

## 2023-01-27 DIAGNOSIS — I495 Sick sinus syndrome: Secondary | ICD-10-CM | POA: Diagnosis not present

## 2023-01-27 LAB — CUP PACEART REMOTE DEVICE CHECK
Battery Remaining Longevity: 29 mo
Battery Remaining Percentage: 28 %
Battery Voltage: 2.89 V
Brady Statistic AP VP Percent: 64 %
Brady Statistic AP VS Percent: 2.3 %
Brady Statistic AS VP Percent: 33 %
Brady Statistic AS VS Percent: 1 %
Brady Statistic RA Percent Paced: 65 %
Brady Statistic RV Percent Paced: 97 %
Date Time Interrogation Session: 20240419080918
Implantable Lead Connection Status: 753985
Implantable Lead Connection Status: 753985
Implantable Lead Implant Date: 20170907
Implantable Lead Implant Date: 20170907
Implantable Lead Location: 753859
Implantable Lead Location: 753860
Implantable Pulse Generator Implant Date: 20170907
Lead Channel Impedance Value: 390 Ohm
Lead Channel Impedance Value: 530 Ohm
Lead Channel Pacing Threshold Amplitude: 0.625 V
Lead Channel Pacing Threshold Amplitude: 0.75 V
Lead Channel Pacing Threshold Pulse Width: 0.5 ms
Lead Channel Pacing Threshold Pulse Width: 0.5 ms
Lead Channel Sensing Intrinsic Amplitude: 12 mV
Lead Channel Sensing Intrinsic Amplitude: 2.9 mV
Lead Channel Setting Pacing Amplitude: 0.875
Lead Channel Setting Pacing Amplitude: 2 V
Lead Channel Setting Pacing Pulse Width: 0.5 ms
Lead Channel Setting Sensing Sensitivity: 2 mV
Pulse Gen Model: 2272
Pulse Gen Serial Number: 7945283

## 2023-02-10 ENCOUNTER — Ambulatory Visit (INDEPENDENT_AMBULATORY_CARE_PROVIDER_SITE_OTHER): Payer: Medicare Other | Admitting: Family Medicine

## 2023-02-10 ENCOUNTER — Other Ambulatory Visit: Payer: Self-pay | Admitting: Internal Medicine

## 2023-02-10 ENCOUNTER — Other Ambulatory Visit: Payer: Self-pay | Admitting: Family Medicine

## 2023-02-10 ENCOUNTER — Encounter: Payer: Self-pay | Admitting: Family Medicine

## 2023-02-10 ENCOUNTER — Other Ambulatory Visit: Payer: Self-pay | Admitting: Cardiovascular Disease

## 2023-02-10 VITALS — BP 144/74 | HR 76 | Temp 97.3°F | Ht 64.0 in | Wt 152.1 lb

## 2023-02-10 DIAGNOSIS — F4323 Adjustment disorder with mixed anxiety and depressed mood: Secondary | ICD-10-CM | POA: Diagnosis not present

## 2023-02-10 DIAGNOSIS — M545 Low back pain, unspecified: Secondary | ICD-10-CM

## 2023-02-10 DIAGNOSIS — I10 Essential (primary) hypertension: Secondary | ICD-10-CM | POA: Diagnosis not present

## 2023-02-10 MED ORDER — BUPROPION HCL ER (SR) 150 MG PO TB12: 150.0000 mg | ORAL_TABLET | Freq: Every morning | ORAL | 1 refills | Status: DC

## 2023-02-10 NOTE — Patient Instructions (Addendum)
Increase wellbutrin to 150mg  daily in the morning.  Good to see you today. Return after 06/25/2023 for wellness visit /physical

## 2023-02-10 NOTE — Progress Notes (Signed)
Ph: [phone]       Fax: [phone]   Patient ID: [name], female    DOB: 03-Dec-1945, 77 y.o.   MRN: [id number]  This visit was conducted in person.  BP (!) 144/74   Pulse 76   Temp (!) 97.3 F (36.3 C) (Temporal)   Ht 5\' 4"  (1.626 m)   Wt 152 lb 2 oz (69 kg)   SpO2 94%   BMI 26.11 kg/m   BP Readings from Last 3 Encounters:  02/10/23 (!) 144/74  01/24/23 134/72  12/02/22 124/66   CC: 3 mo mood f/u visit  Subjective:   HPI: [name] is a 77 y.o. female presenting on 02/10/2023 for Medical Management of Chronic Issues (Here for 3 mo mood f/u.)   Went to [place] yesterday [place] to watch granddaughter graduate [employer]). Ate out twice yesterday.   She has been seeing counselor [name] for adjustment disorder with mixed anxiety/depression. She states may recommend rpt neurocognitive evaluation. Will sign request for Diane Braun to send me her recommendations. Predominant symptoms are tired, altered sleep (oversleeping), and change in eating habits (overeating).   Wellbutrin 100mg  SR daily started late last year.  She continues trazodone 25-50mg  nightly.  New kitten at home (last few months).   Recently Rx hydroxyzine 10mg  BID PRN anxiety for dental procedure - upcoming appt on Thursday. Will try first over weekend at home.      Relevant past medical, surgical, family and social history reviewed and updated as indicated. Interim medical history since our last visit reviewed. Allergies and medications reviewed and updated. Outpatient Medications Prior to Visit  Medication Sig Dispense Refill   acetaminophen (TYLENOL) 500 MG tablet Take 2 tablets (1,000 mg total) by mouth daily.     albuterol (VENTOLIN HFA) 108 (90 Base) MCG/ACT inhaler Inhale 2 puffs into the lungs every 6 (six) hours as needed for wheezing. 18 g 6   amoxicillin (AMOXIL) 500 MG capsule TAKE 4 CAPSULES(2000 MG) BY MOUTH 1 TIME BEFORE DENTAL WORK FOR 1 DOSE 4 capsule 1    apixaban (ELIQUIS) 5 MG TABS tablet TAKE 1 TABLET(5 MG) BY MOUTH TWICE DAILY 180 tablet 1   ascorbic acid (VITAMIN C) 500 MG tablet Take 1 tablet (500 mg total) by mouth daily.     Biotin 1000 MCG tablet Take 1,000 mcg by mouth daily.     calcium carbonate (OSCAL) 1500 (600 Ca) MG TABS tablet Take 600 mg of elemental calcium by mouth daily.     carboxymethylcellulose (REFRESH PLUS) 0.5 % SOLN Place 1 drop into both eyes 3 (three) times daily as needed (dry eyes).     Cholecalciferol (VITAMIN D) 50 MCG (2000 UT) tablet Take 2,000 Units by mouth daily.     docusate sodium (COLACE) 100 MG capsule Take 200 mg by mouth every evening.     furosemide (LASIX) 40 MG tablet Take 1 tablet (40 mg total) by mouth daily. May take an extra tablet daily as needed for swelling. 135 tablet 2   gabapentin (NEURONTIN) 300 MG capsule Take 300 mg by mouth at bedtime.     hydrOXYzine (ATARAX) 10 MG tablet Take 1 tablet (10 mg total) by mouth 2 (two) times daily as needed for anxiety (sedation precautions). 10 tablet 0   ipratropium-albuterol (DUONEB) 0.5-2.5 (3) MG/3ML SOLN Take 3 mLs by nebulization every 6 (six) hours as needed. 360 mL 0   lovastatin (MEVACOR) 40 MG tablet TAKE 1 TABLET(40 MG) BY MOUTH AT  BEDTIME 90 tablet 1   montelukast (SINGULAIR) 10 MG tablet TAKE 1 TABLET(10 MG) BY MOUTH AT BEDTIME 90 tablet 1   Multiple Vitamins-Minerals (PRESERVISION AREDS 2 PO) Take 2 capsules by mouth every evening.      polyethylene glycol (MIRALAX / GLYCOLAX) packet Take 17 g by mouth daily. 14 each 0   potassium chloride (KLOR-CON) 10 MEQ tablet TAKE 2 TABLETS(20 MEQ) BY MOUTH DAILY 180 tablet 1   traMADol (ULTRAM) 50 MG tablet Take 1 tablet (50 mg total) by mouth 2 (two) times daily as needed. 60 tablet 3   traZODone (DESYREL) 50 MG tablet TAKE 1/2 TO 1 TABLET(25 TO 50 MG) BY MOUTH AT BEDTIME 30 tablet 6   TRELEGY ELLIPTA 200-62.5-25 MCG/ACT AEPB INHALE 1 PUFF INTO THE LUNGS DAILY 60 each 11   triamcinolone cream  (KENALOG) 0.1 % Apply 1 Application topically 2 (two) times daily.     Vibegron (GEMTESA) 75 MG TABS Take 75 mg by mouth daily.     vitamin B-12 (CYANOCOBALAMIN) 500 MCG tablet Take 1 tablet (500 mcg total) by mouth 2 (two) times a week.     Zinc 50 MG CAPS Take 1 capsule (50 mg total) by mouth daily.  0   buPROPion ER (WELLBUTRIN SR) 100 MG 12 hr tablet Take 1 tablet (100 mg total) by mouth every morning. 90 tablet 2   predniSONE (DELTASONE) 20 MG tablet Take 2 tablets (40 mg total) by mouth daily. 14 tablet 0   Facility-Administered Medications Prior to Visit  Medication Dose Route Frequency Provider Last Rate Last Admin   ondansetron (ZOFRAN) 4 mg in sodium chloride 0.9 % 50 mL IVPB  4 mg Intravenous Q6H PRN Barnett Abu, MD         Per HPI unless specifically indicated in ROS section below Review of Systems  Objective:  BP (!) 144/74   Pulse 76   Temp (!) 97.3 F (36.3 C) (Temporal)   Ht 5\' 4"  (1.626 m)   Wt 152 lb 2 oz (69 kg)   SpO2 94%   BMI 26.11 kg/m   Wt Readings from Last 3 Encounters:  02/10/23 152 lb 2 oz (69 kg)  01/24/23 147 lb 6 oz (66.8 kg)  12/02/22 150 lb 9.6 oz (68.3 kg)      Physical Exam Vitals and nursing note reviewed.  Constitutional:      Appearance: Normal appearance. She is not ill-appearing.  HENT:     Mouth/Throat:     Mouth: Mucous membranes are moist.     Pharynx: Oropharynx is clear. No oropharyngeal exudate or posterior oropharyngeal erythema.  Cardiovascular:     Rate and Rhythm: Normal rate and regular rhythm.     Pulses: Normal pulses.     Heart sounds: Murmur (3/6 systolic valve murmur) heard.  Pulmonary:     Effort: Pulmonary effort is normal. No respiratory distress.     Breath sounds: Normal breath sounds. No wheezing, rhonchi or rales.  Musculoskeletal:     Right lower leg: No edema.     Left lower leg: No edema.     Comments: Wearing compression stockings  Skin:    General: Skin is warm and dry.     Findings: Bruising  present.  Neurological:     Mental Status: She is alert.  Psychiatric:        Mood and Affect: Mood normal.        Behavior: Behavior normal.          02/10/2023  8:41 AM 01/24/2023    2:07 PM 11/11/2022    2:42 PM 10/05/2022   12:51 PM 06/23/2022    2:24 PM  Depression screen PHQ 2/9  Decreased Interest 1 1 1 1  0  Down, Depressed, Hopeless 1 1 1 2 1   PHQ - 2 Score 2 2 2 3 1   Altered sleeping 2 2 1 3 2   Tired, decreased energy 3 2 2 3 2   Change in appetite 2 3 0 3 0  Feeling bad or failure about yourself  1 0 0 1 0  Trouble concentrating 1 0 0 1 1  Moving slowly or fidgety/restless 1 0 0 1 0  Suicidal thoughts 0 0 0 0 0  PHQ-9 Score 12 9 5 15 6   Difficult doing work/chores Somewhat difficult Somewhat difficult Somewhat difficult  Somewhat difficult       02/10/2023    8:41 AM 01/24/2023    2:07 PM 11/11/2022    2:43 PM 10/05/2022   12:52 PM  GAD 7 : Generalized Anxiety Score  Nervous, Anxious, on Edge 1 0 0 1  Control/stop worrying 1 0 1 1  Worry too much - different things 0 0 1 1  Trouble relaxing 0 1 1 0  Restless 0 0 0 0  Easily annoyed or irritable 1 0 0 0  Afraid - awful might happen 1 0 0 1  Total GAD 7 Score 4 1 3 4   Anxiety Difficulty Somewhat difficult Not difficult at all Somewhat difficult Somewhat difficult   Assessment & Plan:   Problem List Items Addressed This Visit     Essential hypertension, benign    Mildly elevated today in setting of eating out twice yesterday. No changes made.       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 PHQ scores slowly trending up - increase wellbutrin SR to 150mg  daily in am, monitor effect on tremor.         Meds ordered this encounter  Medications   buPROPion (WELLBUTRIN SR) 150 MG 12 hr tablet    Sig: Take 1 tablet (150 mg total) by mouth every morning.    Dispense:  90 tablet    Refill:  1    Note new dose    No orders of the defined types were  placed in this encounter.   Patient Instructions  Increase wellbutrin to 150mg  daily in the morning.  Good to see you today. Return after 06/25/2023 for wellness visit /physical   Follow up plan: Return in about 4 months (around 06/25/2023) for annual exam, prior fasting for blood work, medicare wellness visit.  [name], MD

## 2023-02-10 NOTE — Assessment & Plan Note (Signed)
Chronic, overall stable period. PHQ scores slowly trending up - increase wellbutrin SR to 150mg  daily in am, monitor effect on tremor.

## 2023-02-10 NOTE — Assessment & Plan Note (Signed)
Mildly elevated today in setting of eating out twice yesterday. No changes made.

## 2023-02-13 NOTE — Telephone Encounter (Signed)
Pt last saw Dr Royann Shivers 09/12/22, last labs 10/09/22 Creat 0.80, age 77, weight 69kg, based on specified criteria pt is on appropriate dosage of Eliquis 5mg  BID for afib.  Will refill rx.

## 2023-02-13 NOTE — Telephone Encounter (Signed)
Name of Medication: Tramadol Name of Pharmacy: [facility] [address] Last Fill or Written Date and Quantity: 01/12/23, #180 Last Office Visit and Type: 02/10/23, 3 mo mood f/u Next Office Visit and Type: 06/28/23, CPE Last Controlled Substance Agreement Date: none Last UDS: none

## 2023-02-14 NOTE — Telephone Encounter (Signed)
ERx 1 mo supply 

## 2023-02-21 ENCOUNTER — Ambulatory Visit: Payer: Medicare Other | Admitting: Clinical

## 2023-02-24 NOTE — Progress Notes (Signed)
Remote pacemaker transmission.   

## 2023-03-13 ENCOUNTER — Ambulatory Visit (INDEPENDENT_AMBULATORY_CARE_PROVIDER_SITE_OTHER): Payer: Medicare Other | Admitting: Family Medicine

## 2023-03-13 ENCOUNTER — Encounter: Payer: Self-pay | Admitting: Family Medicine

## 2023-03-13 ENCOUNTER — Ambulatory Visit (INDEPENDENT_AMBULATORY_CARE_PROVIDER_SITE_OTHER)
Admission: RE | Admit: 2023-03-13 | Discharge: 2023-03-13 | Disposition: A | Discharge status: Home / Self Care | Payer: Medicare Other | Source: Ambulatory Visit | Attending: Family Medicine | Admitting: Family Medicine

## 2023-03-13 VITALS — BP 130/72 | HR 97 | Temp 97.5°F | Ht 64.0 in | Wt 147.0 lb

## 2023-03-13 DIAGNOSIS — R2689 Other abnormalities of gait and mobility: Secondary | ICD-10-CM

## 2023-03-13 DIAGNOSIS — M545 Low back pain, unspecified: Secondary | ICD-10-CM

## 2023-03-13 DIAGNOSIS — W19XXXA Unspecified fall, initial encounter: Secondary | ICD-10-CM | POA: Diagnosis not present

## 2023-03-13 DIAGNOSIS — M85859 Other specified disorders of bone density and structure, unspecified thigh: Secondary | ICD-10-CM

## 2023-03-13 NOTE — Assessment & Plan Note (Addendum)
Osteopenia on DEXA 2019 however evidence of vertebral compression fracture 2021 suggestive of osteoporosis.  Will order updated DEXA scan through [facility].

## 2023-03-13 NOTE — Patient Instructions (Signed)
Lower back xray today We will order updated bone density scan  I will refer you to outpatient physical therapy for fall prevention/balance training program. Continue tylenol, tramadol for breakthrough pain, ice/heating pad to back covered in a towel.

## 2023-03-13 NOTE — Assessment & Plan Note (Signed)
Acute midline upper and lower lumbar spine with new noted bulge to upper lumbar area after fall onto concrete.  H/o osteopenia. Update lumbar films today.  Continue tylenol with tramadol for breakthrough pain.  Discussed ice/heat.

## 2023-03-13 NOTE — Assessment & Plan Note (Signed)
Fall with head and back injury but overall reassuring evaluation.  Check lumbar films r/o fracture/other injury after recent fall.  No signs of head injury sequelae or further pelvic injury.  Refer to outpatient PT as per below.

## 2023-03-13 NOTE — Progress Notes (Signed)
Ph: [phone] Fax: [phone]   Patient ID: [name], female    DOB: 1946-03-16, 77 y.o.   MRN: [id number]  This visit was conducted in person.  BP 130/72   Pulse 97   Temp (!) 97.5 F (36.4 C) (Temporal)   Ht 5\' 4"  (1.626 m)   Wt 147 lb (66.7 kg)   SpO2 94%   BMI 25.23 kg/m    CC: fall Subjective:   HPI: [name] is a 77 y.o. female presenting on 03/13/2023 for Fall (6 days ago. )   DOI: 03/07/2023. Fell on concrete sidewalk outside her home while spraying Round-up on mulch. Landed on her back. She also hit posterior head on concrete but denies residual pain, headache, vision change. She couldn't get up on her own - neighbor needed to help her. Not mechanical fall. No dizziness, lightheadedness, or presyncope. She just notes she easily loses her balance.   Tore up skin to R elbow.  Having midline lumbar spine pain as well as bilateral lateral hip pain Notes ongoing imbalance/unsteadiness.  Remotely had outpatient PT for kyphosis.   Tylenol is not helpful to control chronic pain stemming from spine. She has been taking 2 tramadol daily.   She continues eliquis 5mg  bid.  H/o osteopenia with last DEXA 2021, with L1 vertebral fracture noted incidentally 11/2019.  Last week she completed balance training class at local community center.   She sees [facility] ([facility]) s/p InterStim device placement.  Recent short hydroxyzine 10mg  PRN course for planned dental work - has completed this.      Relevant past medical, surgical, family and social history reviewed and updated as indicated. Interim medical history since our last visit reviewed. Allergies and medications reviewed and updated. Outpatient Medications Prior to Visit  Medication Sig Dispense Refill   acetaminophen (TYLENOL) 500 MG tablet Take 2 tablets (1,000 mg total) by mouth daily.     albuterol (VENTOLIN HFA) 108 (90 Base) MCG/ACT inhaler Inhale 2 puffs into the lungs every 6  (six) hours as needed for wheezing. 18 g 6   amoxicillin (AMOXIL) 500 MG capsule TAKE 4 CAPSULES(2000 MG) BY MOUTH 1 TIME BEFORE DENTAL WORK FOR 1 DOSE 4 capsule 1   ascorbic acid (VITAMIN C) 500 MG tablet Take 1 tablet (500 mg total) by mouth daily.     Biotin 1000 MCG tablet Take 1,000 mcg by mouth daily.     buPROPion (WELLBUTRIN SR) 150 MG 12 hr tablet Take 1 tablet (150 mg total) by mouth every morning. 90 tablet 1   calcium carbonate (OSCAL) 1500 (600 Ca) MG TABS tablet Take 600 mg of elemental calcium by mouth daily.     carboxymethylcellulose (REFRESH PLUS) 0.5 % SOLN Place 1 drop into both eyes 3 (three) times daily as needed (dry eyes).     Cholecalciferol (VITAMIN D) 50 MCG (2000 UT) tablet Take 2,000 Units by mouth daily.     docusate sodium (COLACE) 100 MG capsule Take 200 mg by mouth every evening.     ELIQUIS 5 MG TABS tablet TAKE 1 TABLET(5 MG) BY MOUTH TWICE DAILY 180 tablet 1   furosemide (LASIX) 40 MG tablet Take 1 tablet (40 mg total) by mouth daily. May take an extra tablet daily as needed for swelling. 135 tablet 2   gabapentin (NEURONTIN) 300 MG capsule Take 300 mg by mouth at bedtime.     lovastatin (MEVACOR) 40 MG tablet TAKE 1 TABLET(40 MG) BY MOUTH AT BEDTIME 90 tablet  1   montelukast (SINGULAIR) 10 MG tablet TAKE 1 TABLET(10 MG) BY MOUTH AT BEDTIME 90 tablet 1   Multiple Vitamins-Minerals (PRESERVISION AREDS 2 PO) Take 2 capsules by mouth every evening.      polyethylene glycol (MIRALAX / GLYCOLAX) packet Take 17 g by mouth daily. 14 each 0   potassium chloride (KLOR-CON) 10 MEQ tablet TAKE 2 TABLETS(20 MEQ) BY MOUTH DAILY 180 tablet 1   traMADol (ULTRAM) 50 MG tablet TAKE 1 TABLET(50 MG) BY MOUTH TWICE DAILY AS NEEDED 60 tablet 3   traZODone (DESYREL) 50 MG tablet TAKE 1/2 TO 1 TABLET(25 TO 50 MG) BY MOUTH AT BEDTIME 30 tablet 6   TRELEGY ELLIPTA 200-62.5-25 MCG/ACT AEPB INHALE 1 PUFF INTO THE LUNGS DAILY 60 each 11   Vibegron (GEMTESA) 75 MG TABS Take 75 mg by mouth  daily.     vitamin B-12 (CYANOCOBALAMIN) 500 MCG tablet Take 1 tablet (500 mcg total) by mouth 2 (two) times a week.     Zinc 50 MG CAPS Take 1 capsule (50 mg total) by mouth daily.  0   cephALEXin (KEFLEX) 250 MG capsule Take 250 mg by mouth daily.     hydrOXYzine (ATARAX) 10 MG tablet Take 1 tablet (10 mg total) by mouth 2 (two) times daily as needed for anxiety (sedation precautions). 10 tablet 0   cephALEXin (KEFLEX) 250 MG capsule Take 1 capsule (250 mg total) by mouth daily. For UTI prevention, through urology ([facility]Diarm[facility])     ipratropium-albuterol (DUONEB) 0.5-2.5 (3) MG/3ML SOLN Take 3 mLs by nebulization every 6 (six) hours as needed. (Patient not taking: Reported on 03/13/2023) 360 mL 0   triamcinolone cream (KENALOG) 0.1 % Apply 1 Application topically 2 (two) times daily. (Patient not taking: Reported on 03/13/2023)     cephALEXin (KEFLEX) 250 MG capsule Take 1 capsule (250 mg total) by mouth daily. through urology ([facility]Diarm[facility])     Facility-Administered Medications Prior to Visit  Medication Dose Route Frequency Provider Last Rate Last Admin   ondansetron (ZOFRAN) 4 mg in sodium chloride 0.9 % 50 mL IVPB  4 mg Intravenous Q6H PRN Barnett Abu, MD         Per HPI unless specifically indicated in ROS section below Review of Systems  Objective:  BP 130/72   Pulse 97   Temp (!) 97.5 F (36.4 C) (Temporal)   Ht 5\' 4"  (1.626 m)   Wt 147 lb (66.7 kg)   SpO2 94%   BMI 25.23 kg/m   Wt Readings from Last 3 Encounters:  03/13/23 147 lb (66.7 kg)  02/10/23 152 lb 2 oz (69 kg)  01/24/23 147 lb 6 oz (66.8 kg)      Physical Exam Vitals and nursing note reviewed.  Constitutional:      Appearance: Normal appearance. She is not ill-appearing.  Cardiovascular:     Rate and Rhythm: Normal rate and regular rhythm.     Pulses: Normal pulses.     Heart sounds: Normal heart sounds. No murmur heard. Pulmonary:     Effort: Pulmonary effort is normal. No respiratory distress.     Breath  sounds: Normal breath sounds. No wheezing, rhonchi or rales.  Musculoskeletal:        General: Tenderness, deformity and signs of injury present.     Comments:  + pain midline lower lumbar spine No paraspinous mm tenderness Neg seated SLR bilaterally. No pain with int/ext rotation at hip. No significant pain at SIJ, GTB or sciatic notch bilaterally.  No  pain with palpation of pelvis.   Skin:    General: Skin is warm and dry.     Findings: No rash.     Comments: Abrasion to R elbow without erythema or drainage, healing well  Neurological:     Mental Status: She is alert.  Psychiatric:        Mood and Affect: Mood normal.        Behavior: Behavior normal.       Results for orders placed or performed in visit on 01/27/23  CUP PACEART REMOTE DEVICE CHECK  Result Value Ref Range   Date Time Interrogation Session [id number]    Pulse Generator Manufacturer [employer]    Pulse Gen Model 2272 Assurity MRI    Pulse Gen Serial Number [id number]    Clinic Name [facility]    Implantable Pulse Generator Type Implantable Pulse Generator    Implantable Pulse Generator Implant Date [id number]    Implantable Lead Manufacturer [facility]    Implantable Lead Model LPA1200M Tendril MRI    Implantable Lead Serial Number [id number]    Implantable Lead Implant Date [id number]    Implantable Lead Location Detail 1 [employer]PEX    Implantable Lead Location [id number]    Implantable Lead Connection Status [id number]    Implantable Lead Manufacturer [employer]    Implantable Lead Model LPA1200M Tendril MRI    Implantable Lead Serial Number [id number]    Implantable Lead Implant Date [id number]    Implantable Lead Location Detail 1 UNKNOWN    Implantable Lead Location [id number]    Implantable Lead Connection Status [id number]    Lead Channel Setting Sensing Sensitivity 2.0 mV   Lead Channel Setting Sensing Adaptation Mode Fixed Pacing    Lead Channel Setting Pacing Amplitude 2.0 V   Lead Channel Setting Pacing Pulse Width 0.5 ms   Lead  Channel Setting Pacing Amplitude 0.875    Lead Channel Status NULL    Lead Channel Impedance Value 390 ohm   Lead Channel Sensing Intrinsic Amplitude 2.9 mV   Lead Channel Pacing Threshold Amplitude 0.75 V   Lead Channel Pacing Threshold Pulse Width 0.5 ms   Lead Channel Status NULL    Lead Channel Impedance Value 530 ohm   Lead Channel Sensing Intrinsic Amplitude 12.0 mV   Lead Channel Pacing Threshold Amplitude 0.625 V   Lead Channel Pacing Threshold Pulse Width 0.5 ms   Battery Status MOS    Battery Remaining Longevity 29 mo   Battery Remaining Percentage 28.0 %   Battery Voltage 2.89 V   Brady Statistic RA Percent Paced 65.0 %   Brady Statistic RV Percent Paced 97.0 %   Brady Statistic AP VP Percent 64.0 %   Brady Statistic AS VP Percent 33.0 %   Brady Statistic AP VS Percent 2.3 %   Brady Statistic AS VS Percent 1.0 %   *Note: Due to a large number of results and/or encounters for the requested time period, some results have not been displayed. A complete set of results can be found in Results Review.    Assessment & Plan:   Problem List Items Addressed This Visit     Osteopenia    Osteopenia on DEXA 2019 however evidence of vertebral compression fracture 2021 suggestive of osteoporosis.  Will order updated DEXA scan through [facility].        Relevant Orders   DG Bone Density   Acute midline low back pain without sciatica    Acute midline upper and lower lumbar spine with new noted  bulge to upper lumbar area after fall onto concrete.  H/o osteopenia. Update lumbar films today.  Continue tylenol with tramadol for breakthrough pain.  Discussed ice/heat.       Relevant Orders   DG Lumbar Spine Complete   Cause of injury, fall - Primary    Fall with head and back injury but overall reassuring evaluation.  Check lumbar films r/o fracture/other injury after recent fall.  No signs of head injury sequelae or further pelvic injury.  Refer to outpatient PT as per below.        Relevant Orders   DG Lumbar Spine Complete   Ambulatory referral to Physical Therapy   Imbalance    Ongoing unsteadiness. No longer on hydroxyzine.  Will refer to outpatient PT for formal fall prevention/balance training program.       Relevant Orders   Ambulatory referral to Physical Therapy     No orders of the defined types were placed in this encounter.   Orders Placed This Encounter  Procedures   DG Lumbar Spine Complete    Standing Status:   Future    Number of Occurrences:   1    Standing Expiration Date:   03/12/2024    Order Specific Question:   Reason for Exam (SYMPTOM  OR DIAGNOSIS REQUIRED)    Answer:   upper and lower lumbar spine pain with new bulge midline after fall last week    Order Specific Question:   Preferred imaging location?    Answer:   [facility]   DG Bone Density    Standing Status:   Future    Standing Expiration Date:   03/12/2024    Order Specific Question:   Reason for Exam (SYMPTOM  OR DIAGNOSIS REQUIRED)    Answer:   f/u osteoporosis    Order Specific Question:   Preferred imaging location?    Answer:   [facility]   Ambulatory referral to Physical Therapy    Referral Priority:   Routine    Referral Type:   Physical Medicine    Referral Reason:   Specialty Services Required    Requested Specialty:   Physical Therapy    Number of Visits Requested:   1    Patient Instructions  Lower back xray today We will order updated bone density scan  I will refer you to outpatient physical therapy for fall prevention/balance training program. Continue tylenol, tramadol for breakthrough pain, ice/heating pad to back covered in a towel.   Follow up plan: No follow-ups on file.  [name], MD

## 2023-03-13 NOTE — Assessment & Plan Note (Signed)
Ongoing unsteadiness. No longer on hydroxyzine.  Will refer to outpatient PT for formal fall prevention/balance training program.

## 2023-03-14 ENCOUNTER — Ambulatory Visit (INDEPENDENT_AMBULATORY_CARE_PROVIDER_SITE_OTHER): Payer: Medicare Other | Admitting: Clinical

## 2023-03-14 DIAGNOSIS — F4323 Adjustment disorder with mixed anxiety and depressed mood: Secondary | ICD-10-CM | POA: Diagnosis not present

## 2023-03-14 NOTE — Progress Notes (Signed)
[facility] Counselor/Therapist Progress Note  Patient ID: [name], MRN: [id number],    Date: 03/14/2023  Time Spent: 9:34am - 10:20am : 46 minutes   Treatment Type: Individual Therapy  Reported Symptoms:  Patient reported depressed mood at times.   Mental Status Exam: Appearance:  Neat and Well Groomed     Behavior: Appropriate  Motor: Normal  Speech/Language:  Clear and Coherent  Affect: Appropriate  Mood: normal  Thought process: normal  Thought content:   WNL  Sensory/Perceptual disturbances:   WNL  Orientation: oriented to person, place, and situation  Attention: Good  Concentration: Good  Memory: Patient reported difficulty recalling information from the past and during conversations  Fund of knowledge:  Good  Insight:   Fair  Judgment:  Good  Impulse Control: Good   Risk Assessment: Danger to Self:  No Patient denied current suicidal ideation  Self-injurious Behavior: No Danger to Others: No Patient denied current homicidal ideation Duty to Warn:no Physical Aggression / Violence:No  Access to Firearms a concern: No  Gang Involvement:No   Subjective: Patient reported she does not feel therapy is helping and stated, "I can't tell the difference". Patient stated, "it seems like I hurt all the time". Patient reported she fell recently and will be starting physical therapy. Patient reported insurance coverage and the costs associated with therapy are barriers to participation in therapy.  Patient reported her primary care provider increased patient's dosage of Bupropion and patient reported she has not seen a difference in symptoms since the increase in dosage. Patient reported she started participating in therapy to regain her memory. Patient reported changes in memory occurred prior to the death of patient's son and husband. Patient reported she continues to experience difficulty recalling events that have occurred in the past. Patient reported  she has noticed changes when balancing her checkbook. Patient reported she is concerned that she may be experiencing TIA's that are causing patient to lose her balance and fall. Patient reported difficulty finding the words she wants to use in conversations and reported the difficulty has been occurring more frequently. Patient reported she documented two entries in her thought record. Patient reported she was unable to unlock her safe and reported feeling anxious and scared in response. Patient reported recent difficulty accessing her email and reported she was scared she would not be able to recreate her emails. Patient stated,  "I'm ok" in response to patient's current mood.    Interventions: Cognitive Behavioral Therapy. Clinician conducted session in person at clinician's office at [facility]. Discussed patient's thoughts/feelings regarding participation in therapy and continuing therapy. Provided psycho education related to therapy and frequency of therapy. Discussed barriers to patient participating in therapy more frequently. Provided psycho education related to chronic pain and the impact on mood. Discussed patient's concerns related to changes in patient's memory. Discussed patient following up with her primary care provider to discuss patient's concerns related to changes in memory. Reviewed patient's homework. Provided psycho education related to the use of deep breathing and positive self talk.   Collaboration of Care: Other not required at this time  Diagnosis:  Adjustment disorder with mixed anxiety and depressed mood      Plan: Patient reported at this time she does not want to continue therapy and reported she will call if she decides to resume therapy.   Doree Barthel, LCSW

## 2023-03-14 NOTE — Progress Notes (Signed)
                Dameshia Seybold, LCSW 

## 2023-03-27 ENCOUNTER — Ambulatory Visit (INDEPENDENT_AMBULATORY_CARE_PROVIDER_SITE_OTHER)
Admission: RE | Admit: 2023-03-27 | Discharge: 2023-03-27 | Disposition: A | Discharge status: Home / Self Care | Payer: Medicare Other | Source: Ambulatory Visit | Attending: Family Medicine | Admitting: Family Medicine

## 2023-03-27 DIAGNOSIS — M85859 Other specified disorders of bone density and structure, unspecified thigh: Secondary | ICD-10-CM

## 2023-03-30 ENCOUNTER — Ambulatory Visit: Payer: Medicare Other | Attending: Cardiovascular Disease | Admitting: Cardiovascular Disease

## 2023-03-30 ENCOUNTER — Encounter: Payer: Self-pay | Admitting: Cardiovascular Disease

## 2023-03-30 VITALS — BP 120/82 | HR 71 | Ht 64.0 in | Wt 150.2 lb

## 2023-03-30 DIAGNOSIS — I700 Atherosclerosis of aorta: Secondary | ICD-10-CM | POA: Diagnosis not present

## 2023-03-30 DIAGNOSIS — I442 Atrioventricular block, complete: Secondary | ICD-10-CM | POA: Diagnosis not present

## 2023-03-30 DIAGNOSIS — Z87898 Personal history of other specified conditions: Secondary | ICD-10-CM

## 2023-03-30 DIAGNOSIS — I5032 Chronic diastolic (congestive) heart failure: Secondary | ICD-10-CM

## 2023-03-30 DIAGNOSIS — T82857D Stenosis of cardiac prosthetic devices, implants and grafts, subsequent encounter: Secondary | ICD-10-CM | POA: Diagnosis not present

## 2023-03-30 DIAGNOSIS — Z953 Presence of xenogenic heart valve: Secondary | ICD-10-CM

## 2023-03-30 DIAGNOSIS — D6869 Other thrombophilia: Secondary | ICD-10-CM

## 2023-03-30 DIAGNOSIS — I495 Sick sinus syndrome: Secondary | ICD-10-CM | POA: Diagnosis not present

## 2023-03-30 DIAGNOSIS — Z950 Presence of cardiac pacemaker: Secondary | ICD-10-CM

## 2023-03-30 NOTE — Progress Notes (Signed)
Cardiology office note   Date:  03/30/2023   ID:  [name], [place] 1946-05-15, MRN [id number] PCP:  [name], MD  Cardiologist:  Thurmon Fair, MD  Electrophysiologist:  None   Evaluation Performed:  Follow-Up Visit  Chief Complaint: CHF, AFib  History of Present Illness:    [name] is a 77 y.o. 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  She is done well from a cardiovascular point of view.  She has not had any shortness of breath at rest or with activity, lower extremity edema or angina pectoris, palpitations or syncope.  She did have a fall in her yard, but did not have head injury or any bleeding problems.  This was a mechanical fall, not associated with dizziness or syncope.  Her biggest problems remain related to her back pain.  She is also still worried about her memory.  She is nevertheless able to live independently.  Pacemaker function is normal.  Estimated generator longevity is 2.2 years.  She has 66% atrial pacing with a good heart rate histogram distribution and 100% ventricular pacing due to complete heart block.  When she is paced VVI at 30 bpm she does have a ventricular escape rhythm at about 42 bpm.  Her device shows frequent episodes of paroxysmal atrial tachycardia, all of them lasting less than 20 seconds, none of them sustained on the current download.  I do not see any evidence of atrial fibrillation since last November.      In August 2020  she had transient worsening mitral stenosis due to leaflet thrombosis, resolved after Xarelto was switched to warfarin.  She had a lot of problems with easy bleeding and bruising on warfarin, as well as highly erratic INR, rarely kept in normal range.  In February 2022 we switched from warfarin to Eliquis.  She has had repeated episodes of transient partial visual field cut in her left eye, lasting for a few minutes at a time.  Usually its the bottom half of her visual field that she loses.  Initial concern that this may be related to thromboembolic events is felt to likely anymore, since the episodes are repetitive and stereotypical always in the same distribution.  Her previous ophthalmological evaluation also "did not show anything wrong".  She remains frustrated with her memory problems.  She has a follow-up with the neuropsychologist on July 14.  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   She had a CT myelogram on May 08 2019.  There was severe left foraminal stenosis at L3-4 as well as moderate left foraminal stenosis at C4-5-6.  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   She had her most recent echocardiogram 10/13/2021.  This continues to show normal left ventricular systolic function and stable aortic valve prosthesis parameters.  The mean mitral gradient is slightly higher at 9.7 mmHg (up from 7 mmHg), but the mitral valve deceleration time is actually little lower at 338 ms (previously 392 ms).  This suggests that the increased gradients may be due to a change in cardiac output/stroke-volume,  rather than a true deterioration in mitral valve performance.  She has a very mild ascending aortic aneurysm that has been stable in size, most recently 4.2 cm on 08/16/2021.  She does have scattered aortic atherosclerosis.  On this last scan there appeared to be improvement in the areas of bronchiectasis/groundglass opacities represent MAC infection.    Past Medical History:  Diagnosis Date   2nd  degree atrioventricular block 12/31/2016   Acute cystitis without hematuria 12/22/2015   Acute right-sided low back pain without sciatica 11/26/2019   Saw Elsner 12/2019 - planned L2/3 translaminar epidural steroid injection. Known DISH with thoracic spine fusion and moderate kyphosis, h/o L spine fusion L2-5   Adjustment disorder with depressed mood 2014-11-11   64 yo son died MI 2014-10-28 Husband died car accident 29-Apr-2015   Anemia    Anticoagulant long-term use 06/14/2019   She takes warfarin for afib/flutter with CHADS2VASc score of 4 (age, sex, CHF, HTN), s/p 2 bioprosthetic valve replacements Coumadin changed to eliquis 04-28-21 per cardiology   Aortic atherosclerosis 03/03/2022   Noted on 01/2022 chest CT   Arthritis    Arthropathy of spinal facet joint 03/08/2018   Ascending aortic aneurysm 03/02/2021   4.1cm on CT 01/2021 rec yearly monitoring   Bilateral hip pain 09/08/2016   CAP (community acquired pneumonia) 02/08/2016   Carotid stenosis 12/08/2020   Carotid US - 40-59% BICA stenosis (2015) Carotid US 12/2020 - 1-39% BICA stenosis, incidental 3.4cm structure behind L common carotid artery rec further imaging    Cataracts, bilateral    immature   Chest pain 08/25/2014   myoview low risk 02/2018 (Camitz)   Chronic cholecystitis with calculus 01/21/2015   S/p cholecystectomy    Chronic diastolic heart failure 09/19/2014   Chronic insomnia    Chronic lower back pain 03/05/2008   Returned to Dr Danielle Dess - translaminar ESI at L3/4. Myelogram showed significant left sided foraminal stenosis at L2/3 and L3/4 in setting of solid arthrodesis, planned DEXA and if stable osteopenia, considering anterolateral decompression with spacer.    CKD (chronic kidney disease) stage 2, GFR 60-89 ml/min 07/03/2015   Constipation    takes Miralax daily as needed   COPD (chronic obstructive pulmonary disease)    Albuterol inhaler daily as needed;Duoneb daily as needed;Spiriva daily   DDD (degenerative disc  disease)    cervical - kyphosis with mod DD changes C5/6 and C6/7; lumbar - early DD at L2/3 (Elsner)   DISH (diffuse idiopathic skeletal hyperostosis) 05/14/2019   By MRI noted fusion of lower thoracic vertebrae - likely contributing to unsteadiness (Elsner)   Dyspnea on exertion 06/05/2015   myoview low risk 02/2018   Essential hypertension, benign 03/24/2009   GERD (gastroesophageal reflux disease)    takes Protonix daily   Hematuria 06/17/2021   History of radiation therapy 05/30/11 to 07/07/11   rectum   History of rectal cancer 05/26/2011   Rectal cancer 04-29-2011, midrectum [id number], s/p neoadj chemo&XRT, lap LAR with diverting loop ileostomy 08/2011, ileostomy takedown 04/28/12 Discharged from oncology clinic 2016/04/28 Truett Perna)   History of shingles    History of vertebral fracture 11/26/2019   Old L1 vertebral compression fracture incidentally noted on imaging 04-29-2019    HLD (hyperlipidemia) 05/03/2007   Hyperlipemia    takes Lovastatin daily   Lacunar infarction    Left caudate   Left hip pain 07/29/2020   Legally blind in left eye    Lung nodule    RLL nodule-48mm stable 2006, April 2009, and 2023/04/29  2009   Lymphangioma 01/07/2021   Noted incidentally on carotid US 12/2020 CT neck - Likely benign lymphangioma 01/2021   Macrocytosis 03/27/2022   Folate and b12 checked 2023   Malaise and fatigue 01/22/2018   Meralgia paresthetica 12/13/2007   Mixed stress and urge urinary incontinence 03/16/2015   Failed oxybutynin IR/ER. Vesicare initially helped but then no longer helpful.  myrbetriq was not effective.  Saw urology (MacDiarmid) - UDS largely overactive bladder with mild-mod stress incontinence. Improved on abx course - maintained on [place].    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   Pulmonary infiltrates 04/23/2019   06/13/2018-CT super D chest without contrast- generally stable chronic lung disease with pleural parenchymal scarring and scattered nodularity most of these nodules have been tree-in-bud in appearance likely postinfectious or inflammatory the dominant right lower lobe nodule seen on most recent study has resolved no new or enlarging nodules  04/22/2019-CT chest without contrast- waxing and waning per   Pulmonary nodule 02/23/2012   RLL nodule-51mm stable 2006, April 2009, and June 2009 Small pulm nodules Jan 2012 - > Oct 2012 without change   Rheumatic heart disease mitral stenosis    mod MS by echo 02/2014   Right leg swelling 06/11/2020   R venous US 06/2020 WNL: - No evidence of deep vein thrombosis in the lower extremity. No indirect evidence of obstruction proximal to the inguinal ligament. - No cystic structure found in the popliteal fossa.   S/P aortic and mitral valve bioprostheses 12/08/2013   AVR 21 mm, MVR 25 mm-MagnaEase pericardia   S/P AVR (aortic valve replacement) 2015   bioprosthetic (Bartle)   S/P MVR (mitral valve replacement) 2015   bioprosthetic (Bartle)   Sepsis secondary to UTI (HCC) 08/22/2017   Severe aortic valve stenosis 10/01/2013   mild-mod  by echo 02/2014   Skin lesion 01/03/2022   Small bowel obstruction, partial 08/2013   reolved without NGT placement.    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   Dr Amy [place]   Syncope 05/28/2018   Thrombocytopenia 11/04/2019   Typical atrial flutter 05/15/2019   Vitamin B12 deficiency 02/28/2015   Start B12 shots 02/2015    Vitamin D deficiency 10/23/2016   Past Surgical History:  Procedure Laterality Date   ANTERIOR LAT LUMBAR FUSION Left 07/17/2018   Procedure: Lumbar Two-Three Lumbar Three-Four Anterolateral decompression/interbody fusion with lateral plate fixation/Infuse;  Surgeon: Barnett Abu, MD;  Location: [facility] OR;  Service: Neurosurgery;  Laterality: Left;  Lumbar Two-Three Lumbar Three-Four Anterolateral decompression/interbody fusion with lateral plate fixation/Infuse   AORTIC VALVE REPLACEMENT N/A 12/12/2013   Procedure: AORTIC VALVE REPLACEMENT (AVR);  Surgeon: Alleen Borne, MD; Service: Open Heart Surgery   BACK SURGERY  2006, 2007   2006 SPACER, 2007 decompression and fusion L4/5   BOWEL RESECTION  04/02/2012   Procedure: SMALL BOWEL RESECTION;  Surgeon: [name], MD;  Location: [facility] ORS;  Service: General;  Laterality: N/A;   BRONCHIAL WASHINGS  03/24/2021   Procedure: BRONCHIAL WASHINGS;  Surgeon: [name], MD;  Location: [facility] ENDOSCOPY;  Service: Endoscopy;;   CARPAL TUNNEL RELEASE Bilateral    CATARACT EXTRACTION W/ INTRAOCULAR LENS IMPLANT Left 10/18/2016   CATARACT EXTRACTION W/ INTRAOCULAR LENS IMPLANT Right 12/13/2016   Dr. Dagoberto Ligas   CHOLECYSTECTOMY N/A 01/21/2015   chronic cholecystitis, [name], MD   COLON RESECTION  08/25/2011   Procedure: COLON RESECTION LAPAROSCOPIC;  Surgeon: [name], MD;  Location: [facility] ORS;  Service: General;  Laterality: N/A;  Laparoscopic Assisted Low Anterior Resection Diverting Ostomy and onQ pain pump   COLONOSCOPY  03/2013   1 polyp, rpt 3 yrs Christella Hartigan)   COLONOSCOPY  05/2021    diverticulosis and patent colon anastomosis with some edema/narrowing, rpt 5 yrs Christella Hartigan)   COLONOSCOPY WITH PROPOFOL N/A 06/09/2016   patent colo-colonic anastomosis, rpt 5 yrs Christella Hartigan)   EP IMPLANTABLE DEVICE N/A 06/16/2016   Procedure: Pacemaker Implant;  Surgeon: Will Jorja Loa, MD;  Location: [facility] INVASIVE CV LAB;  Service: Cardiovascular;  Laterality: N/A;   FOOT SURGERY  left foot   hammer toe   ILEOSTOMY  08/25/2011   ILEOSTOMY CLOSURE  04/02/2012   Procedure: ILEOSTOMY TAKEDOWN;  Surgeon: [name], MD;  Location: [facility] ORS;  Service: General;  Laterality: N/A;   INTRAOPERATIVE TRANSESOPHAGEAL ECHOCARDIOGRAM N/A 12/12/2013   Procedure: INTRAOPERATIVE TRANSESOPHAGEAL ECHOCARDIOGRAM;  Surgeon: Alleen Borne, MD;  Location: [facility] OR;  Service: Open Heart Surgery;  Laterality: N/A;   LEFT AND RIGHT HEART CATHETERIZATION WITH CORONARY ANGIOGRAM N/A 11/27/2013   Procedure: LEFT AND RIGHT HEART CATHETERIZATION WITH CORONARY ANGIOGRAM;  Surgeon: Thurmon Fair, MD;  Location: [facility] CATH LAB;  Service: Cardiovascular;  Laterality: N/A;   MITRAL VALVE REPLACEMENT N/A 12/12/2013   Procedure: MITRAL VALVE (MV) REPLACEMENT OR REPAIR;  Surgeon: Alleen Borne, MD; Service: Open Heart Surgery   TEE WITHOUT CARDIOVERSION N/A 11/29/2013   Procedure: TRANSESOPHAGEAL ECHOCARDIOGRAM (TEE);  Surgeon: Quintella Reichert, MD;  [facility] ENDOSCOPY;  Service: Cardiovascular;  Laterality: N/A;   TEE WITHOUT CARDIOVERSION N/A 06/04/2019   Procedure: TRANSESOPHAGEAL ECHOCARDIOGRAM (TEE);  Surgeon: Thurmon Fair, MD;  Location: [facility] ENDOSCOPY;  Service: Cardiovascular;  Laterality: N/A;   TONSILLECTOMY     VIDEO BRONCHOSCOPY N/A 03/24/2021   Procedure: VIDEO BRONCHOSCOPY WITHOUT FLUORO;  Surgeon: [name], MD;  Location: [facility] ENDOSCOPY;  Service: Endoscopy;  Laterality: N/A;  SMALL SCOPE,PREMEDICATE WITH NEBULIZED LIDOCAINE     Current Meds  Medication Sig   acetaminophen (TYLENOL) 500 MG tablet Take 2  tablets (1,000 mg total) by mouth daily.   ascorbic acid (VITAMIN C) 500 MG tablet Take 1 tablet (500 mg total) by mouth daily.   Biotin 1000 MCG tablet Take 1,000 mcg by mouth daily.   buPROPion (WELLBUTRIN SR) 150 MG 12 hr tablet Take 1 tablet (150 mg total) by mouth every morning.   calcium carbonate (OSCAL) 1500 (600 Ca) MG TABS tablet Take 600 mg of elemental calcium by mouth daily.   carboxymethylcellulose (REFRESH PLUS) 0.5 % SOLN Place 1 drop into both eyes 3 (three) times daily as needed (dry eyes).   cephALEXin (KEFLEX) 250 MG capsule Take 1 capsule (250 mg  total) by mouth daily. For UTI prevention, through urology (MacDiarmid)   Cholecalciferol (VITAMIN D) 50 MCG (2000 UT) tablet Take 2,000 Units by mouth daily.   docusate sodium (COLACE) 100 MG capsule Take 200 mg by mouth every evening.   ELIQUIS 5 MG TABS tablet TAKE 1 TABLET(5 MG) BY MOUTH TWICE DAILY   furosemide (LASIX) 40 MG tablet Take 1 tablet (40 mg total) by mouth daily. May take an extra tablet daily as needed for swelling.   gabapentin (NEURONTIN) 300 MG capsule Take 300 mg by mouth at bedtime.   lovastatin (MEVACOR) 40 MG tablet TAKE 1 TABLET(40 MG) BY MOUTH AT BEDTIME   montelukast (SINGULAIR) 10 MG tablet TAKE 1 TABLET(10 MG) BY MOUTH AT BEDTIME   Multiple Vitamins-Minerals (PRESERVISION AREDS 2 PO) Take 2 capsules by mouth every evening.    potassium chloride (KLOR-CON) 10 MEQ tablet TAKE 2 TABLETS(20 MEQ) BY MOUTH DAILY   traMADol (ULTRAM) 50 MG tablet TAKE 1 TABLET(50 MG) BY MOUTH TWICE DAILY AS NEEDED   traZODone (DESYREL) 50 MG tablet TAKE 1/2 TO 1 TABLET(25 TO 50 MG) BY MOUTH AT BEDTIME   TRELEGY ELLIPTA 200-62.5-25 MCG/ACT AEPB INHALE 1 PUFF INTO THE LUNGS DAILY   Vibegron (GEMTESA) 75 MG TABS Take 75 mg by mouth daily.   vitamin B-12 (CYANOCOBALAMIN) 500 MCG tablet Take 1 tablet (500 mcg total) by mouth 2 (two) times a week.   Zinc 50 MG CAPS Take 1 capsule (50 mg total) by mouth daily.     Allergies:    Codeine, Sulfa antibiotics, and Sulfonamide derivatives   Social History   Tobacco Use   Smoking status: Former    Packs/day: 1.50    Years: 10.00    Additional pack years: 0.00    Total pack years: 15.00    Types: Cigarettes    Quit date: 12/28/1974    Years since quitting: 48.2   Smokeless tobacco: Never   Tobacco comments:    quit smoking in 1976  Vaping Use   Vaping Use: Never used  Substance Use Topics   Alcohol use: Not Currently    Alcohol/week: 2.0 standard drinks of alcohol    Types: 2 Glasses of wine per week    Comment: very rare   Drug use: No     Family Hx: The patient's family history includes Breast cancer (age of onset: 3) in her maternal aunt; CAD (age of onset: 39) in her son; Diabetes in her mother; Heart attack in her maternal grandfather; Heart attack (age of onset: 41) in her son; Heart disease in her mother; Hypertension in her mother; Lung cancer in her father; Stroke in her mother. There is no history of Rectal cancer, Stomach cancer, Esophageal cancer, or Colon cancer.  ROS:   Please see the history of present illness.     All other systems reviewed and are negative.   Prior CV studies:   The following studies were reviewed today:  Echocardiogram 03/26/2021  1. S/P AVR with mean gradient of 14 mmHg and trace AI; S/P MVR with mean  gradient of 7 mmHg.   2. Left ventricular ejection fraction, by estimation, is 60 to 65%. The  left ventricle has normal function. The left ventricle has no regional  wall motion abnormalities. There is mild left ventricular hypertrophy.  Left ventricular diastolic parameters  are indeterminate. Elevated left atrial pressure.   3. Right ventricular systolic function is normal. The right ventricular  size is mildly enlarged. There is mildly elevated pulmonary  artery  systolic pressure.   4. Left atrial size was severely dilated.   5. The mitral valve has been repaired/replaced. No evidence of mitral  valve  regurgitation. Mild mitral stenosis. There is a bioprosthetic valve  present in the mitral position.   6. The aortic valve has been repaired/replaced. Aortic valve  regurgitation is trivial. No aortic stenosis is present. There is a  bioprosthetic valve present in the aortic position.   7. Aortic dilatation noted. There is mild dilatation of the aortic root,  measuring 43 mm. There is mild dilatation of the ascending aorta,  measuring 43 mm.   8. The inferior vena cava is normal in size with greater than 50%  respiratory variability, suggesting right atrial pressure of 3 mmHg.  MV Peak grad:  17.1 mmHg  MV Mean grad:  7.0 mmHg      MV Vmax:       2.07 m/s      MV Decel Time: 392 msec    Echocardiogram 10/06/2022   1. Left ventricular ejection fraction, by estimation, is 60 to 65%. The  left ventricle has normal function. The left ventricle has no regional  wall motion abnormalities. There is moderate concentric left ventricular  hypertrophy. Left ventricular  diastolic parameters are indeterminate.   2. Right ventricular systolic function is mildly reduced. The right  ventricular size is moderately enlarged. There is moderately elevated  pulmonary artery systolic pressure. The estimated right ventricular  systolic pressure is 46.7 mmHg.   3. Left atrial size was severely dilated.   4. The mitral valve has been repaired/replaced. Trivial mitral valve  regurgitation. There is a 25 mm Magna Ease bioprosthetic valve present in  the mitral position. Procedure Date: 12/22/2013. The mean gradient is  at HR 74, EOA 1.3cm2. Likely mild   prosthetic mitral stenosis.   5. The aortic valve has been repaired/replaced. There is a 21 mm  bioprosthetic valve present in the aortic position. Procedure Date:  12/22/2013. Echo findings are consistent with normal structure and function  of the aortic valve prosthesis. Aortic valve  mean gradient measures 14.0 mmHg. Aortic valve Vmax measures  2.56 m/s.  Trivial paravalvular leak   6. Aortic dilatation noted. There is mild dilatation of the ascending  aorta, measuring 43 mm.   7. The inferior vena cava is dilated in size with >50% respiratory  variability, suggesting right atrial pressure of 8 mmHg.   Comparison(s): Compared to prior TTE on 10/2021, there is no significant  change. Prior mean mitral gradient 9.50mmHg, prior mean aortic gradient  12.74mmHg.     Labs/Other Tests and Data Reviewed:   CT angiogram of head and neck 06/18/2021 No evidence of acute brain infarction. Chronic small-vessel ischemic change of the white matter. Old lacunar infarction left caudate head. No intracranial large vessel occlusion or correctable proximal stenosis. Aortic atherosclerosis. Atherosclerotic disease at both carotid artery bifurcations. 30-40% stenosis of the proximal ICA on the right. 30% stenosis of the proximal ICA on the left.  EKG: ECG today shows AV sequential pacing.  She has a positive R wave in lead V1 but the QRS is very broad at 212 ms.  Expected prolonged QTc is 562 ms.   On my review of her chest CTs it's highly suspicious that the tip of the right ventricular pacing lead is in the pericardial space.  This would explain the positive R waves in leads V1.  Recent Labs: 06/22/2022: ALT 15 10/09/2022: BUN 14; Creatinine, Ser 0.80; Hemoglobin  12.4; Platelets 78; Potassium 3.9; Sodium 139   Recent Lipid Panel Lab Results  Component Value Date/Time   CHOL 154 06/22/2022 08:04 AM   TRIG 88.0 06/22/2022 08:04 AM   HDL 61.70 06/22/2022 08:04 AM   CHOLHDL 2 06/22/2022 08:04 AM   LDLCALC 75 06/22/2022 08:04 AM   LDLDIRECT 111.0 06/26/2015 09:28 AM    Wt Readings from Last 3 Encounters:  03/30/23 150 lb 3.2 oz (68.1 kg)  03/13/23 147 lb (66.7 kg)  02/10/23 152 lb 2 oz (69 kg)     Objective:    Vital Signs:  BP 120/82 (BP Location: Left Arm, Patient Position: Sitting, Cuff Size: Normal)   Pulse 71   Ht 5\' 4"  (1.626  m)   Wt 150 lb 3.2 oz (68.1 kg)   SpO2 92%   BMI 25.78 kg/m      General: Alert, oriented x3, no distress, healthy subclavian pacemaker site Head: no evidence of trauma, PERRL, EOMI, no exophtalmos or lid lag, no myxedema, no xanthelasma; normal ears, nose and oropharynx Neck: normal jugular venous pulsations and no hepatojugular reflux; brisk carotid pulses without delay and no carotid bruits Chest: clear to auscultation, no signs of consolidation by percussion or palpation, normal fremitus, symmetrical and full respiratory excursions Cardiovascular: normal position and quality of the apical impulse, regular rhythm, normal first and second heart sounds, 1/6 aortic ejection murmur, musical in quality; no diastolic murmurs, rubs or gallops Abdomen: no tenderness or distention, no masses by palpation, no abnormal pulsatility or arterial bruits, normal bowel sounds, no hepatosplenomegaly Extremities: no clubbing, cyanosis or edema; 2+ radial, ulnar and brachial pulses bilaterally; 2+ right femoral, posterior tibial and dorsalis pedis pulses; 2+ left femoral, posterior tibial and dorsalis pedis pulses; no subclavian or femoral bruits Neurological: grossly nonfocal Psych: Normal mood and affect   ASSESSMENT & PLAN:    1. Chronic diastolic heart failure (HCC)   2. Aortic atherosclerosis (HCC)   3. Stenosis of prosthetic mitral valve, subsequent encounter   4. History of aortic valve replacement with bioprosthetic valve   5. CHB (complete heart block) (HCC)   6. SSS (sick sinus syndrome) (HCC)   7. Pacemaker   8. Acquired thrombophilia (HCC)   9. History of syncope         CHF: NYHA functional class I and clinically euvolemic on a very low-dose of loop diuretic.  Normal LVEF.   Previously had decompensation due to mitral bioprosthetic valve thrombosis and increased mitral stenosis. Prosthetic MV stenosis: Transient, related to thrombotic leaflet restriction.  Mean mitral valve gradients  have been stable at around 7-10  mmHg, most recently evaluated in January 2023.  We switched  from warfarin back to Eliquis due to problems with maintaining INR in therapeutic range and easy bleeding problems when INR was high.  There is really no change in her mitral valve gradients on the most recent echocardiogram.   S/p AVR: Normal function of the aortic valve biological prosthesis on recent echo.  Stable gradients.  Aware of the need for endocarditis prophylaxis. 3rd deg AV block: Although she does have an idioventricular escape rhythm, she has complete heart block and for safety should be considered pacemaker dependent. SSS-tachy/brady sd: Normal heart rate histogram distribution with the current sensor settings. Pacemaker: Normal device function.  Continue remote monitoring every 3 months. Atrial fibrillation: She was in atrial fibrillation essentially 100% of 2021.  She now has an extremely low burden of atrial fibrillation. Anticoagulation: Has not had bleeding problems.  Despite  the presence of biological aortic and mitral valve prostheses, we have switched from warfarin to Eliquis due to difficulty with maintaining INR levels in therapeutic range and bleeding complications.   History of syncope: No recent problems.  History of syncope on higher doses of bisoprolol due to orthostatic hypotension.  No recurrence on metoprolol. COPD: No recent episodes of exacerbation.  Denies wheezing.  Continue Trelegy. 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  Improving on most recent scan from November 2022.  Last saw Dr. 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  Has not occurred recently.  Since the episodes are repetitive, this makes cardioembolic events virtually impossible.   Ascending aortic aneurysm: Very mild, essentially stable in size on serial imaging studies.  Although not mentioned in the radiology report  from 05/13/2022, on my measurement it is completely unchanged.  Plan to reevaluate in late 2024.  HLP: Does not have known CAD/PAD, but does have some aortic atherosclerosis.  Sept 13 2023 lipid parameters are all acceptable.  DISH: spinal injection did not help.  Limits her activity.  Evaluated by neurology as well who recommended using a cane or walker. Memory problems: This seems to be more an issue with attention and focus, rather than dementia.  Anxiety and depression probably playing a role.  Did well on MMSE (score 30).  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  Note is made of atherosclerotic calcification of the intracranial arteries at the base of the brain. ASCVD: Has aortic atherosclerosis. 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  No significant stenosis of the intracranial circulation.   Patient Instructions  Medication Instructions:  No changes *If you need a refill on your cardiac medications before your next appointment, please call your pharmacy*  Follow-Up: At [facility], you and your health needs are our priority.  As part of our continuing mission to provide you with exceptional heart care, we have created designated Provider Care Teams.  These Care Teams include your primary Cardiologist (physician) and Advanced Practice Providers (APPs -  Physician Assistants and Nurse Practitioners) who all work together to provide you with the care you need, when you need it.  We recommend signing up for the patient portal called "MyChart".  Sign up information is provided on this After Visit Summary.  MyChart is used to connect with patients for Virtual  Visits (Telemedicine).  Patients are able to view lab/test results, encounter notes, upcoming appointments, etc.  Non-urgent messages can be sent to your provider as well.   To learn more about what you can do with MyChart, go to [email or web address].    Your next appointment:   1 year(s)  Provider:   Thurmon Fair, MD      Signed, Thurmon Fair, MD  03/30/2023 5:05 PM    [facility]

## 2023-03-30 NOTE — Patient Instructions (Signed)
Medication Instructions:  No changes *If you need a refill on your cardiac medications before your next appointment, please call your pharmacy*  Follow-Up: At [facility], you and your health needs are our priority.  As part of our continuing mission to provide you with exceptional heart care, we have created designated Provider Care Teams.  These Care Teams include your primary Cardiologist (physician) and Advanced Practice Providers (APPs -  Physician Assistants and Nurse Practitioners) who all work together to provide you with the care you need, when you need it.  We recommend signing up for the patient portal called "MyChart".  Sign up information is provided on this After Visit Summary.  MyChart is used to connect with patients for Virtual Visits (Telemedicine).  Patients are able to view lab/test results, encounter notes, upcoming appointments, etc.  Non-urgent messages can be sent to your provider as well.   To learn more about what you can do with MyChart, go to [email or web address].    Your next appointment:   1 year(s)  Provider:   Mihai Croitoru, MD     

## 2023-03-31 ENCOUNTER — Encounter: Payer: Self-pay | Admitting: Family Medicine

## 2023-04-04 ENCOUNTER — Encounter: Payer: Self-pay | Admitting: Family Medicine

## 2023-04-06 ENCOUNTER — Encounter: Payer: Self-pay | Admitting: Family Medicine

## 2023-04-06 NOTE — Telephone Encounter (Signed)
Replied via result note.

## 2023-04-12 ENCOUNTER — Ambulatory Visit: Payer: Medicare Other

## 2023-04-14 ENCOUNTER — Ambulatory Visit
Admission: RE | Admit: 2023-04-14 | Discharge: 2023-04-14 | Disposition: A | Discharge status: Home / Self Care | Payer: Medicare Other | Source: Ambulatory Visit | Attending: Family Medicine | Admitting: Family Medicine

## 2023-04-14 DIAGNOSIS — Z1231 Encounter for screening mammogram for malignant neoplasm of breast: Secondary | ICD-10-CM

## 2023-04-18 ENCOUNTER — Other Ambulatory Visit: Payer: Self-pay | Admitting: Family Medicine

## 2023-04-18 DIAGNOSIS — Z1231 Encounter for screening mammogram for malignant neoplasm of breast: Secondary | ICD-10-CM

## 2023-04-24 ENCOUNTER — Encounter: Payer: Self-pay | Admitting: Family Medicine

## 2023-04-24 ENCOUNTER — Ambulatory Visit (INDEPENDENT_AMBULATORY_CARE_PROVIDER_SITE_OTHER): Payer: Medicare Other | Admitting: Family Medicine

## 2023-04-24 VITALS — BP 134/68 | HR 77 | Temp 97.6°F | Ht 64.0 in | Wt 149.2 lb

## 2023-04-24 DIAGNOSIS — S0990XA Unspecified injury of head, initial encounter: Secondary | ICD-10-CM | POA: Insufficient documentation

## 2023-04-24 DIAGNOSIS — K625 Hemorrhage of anus and rectum: Secondary | ICD-10-CM

## 2023-04-24 DIAGNOSIS — H548 Legal blindness, as defined in USA: Secondary | ICD-10-CM

## 2023-04-24 MED ORDER — GABAPENTIN 100 MG PO CAPS: 100.0000 mg | ORAL_CAPSULE | Freq: Two times a day (BID) | ORAL | 1 refills | Status: DC | PRN

## 2023-04-24 MED ORDER — HYDROCORTISONE (PERIANAL) 2.5 % EX CREA: 1.0000 | TOPICAL_CREAM | Freq: Two times a day (BID) | CUTANEOUS | 0 refills | Status: DC

## 2023-04-24 NOTE — Assessment & Plan Note (Signed)
Head injury 10+d ago with resultant ecchymosis and subsequent bruising developing inferior to initial injury due to gravity.  Non focal neurological exam.  Fortunately no vision changes, no ongoing headache or other signs of more serious injury.  She is anticoagulated but I don't see need for head imaging.

## 2023-04-24 NOTE — Progress Notes (Signed)
Ph: [phone] Fax: [phone]   Patient ID: [name], female    DOB: 1946-02-08, 77 y.o.   MRN: [id number]  This visit was conducted in person.  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 female presenting on 04/24/2023 for Eye Problem (C/o R blk eye. Now discoloration has spread into medial corner of L eye and up on forehead. Noticed 04/20/23. Pt states she walked into a door 1 wk before discoloration appeared. )   DOI: 04/13/2023 Walked into a door by accident when she got up at night to go to the bathroom, hit top of her head.  1 week later developed bruise to forehead with spread to bilateral eyes .   She is anticoagulated with eliquis 5mg  BID.   She uses tramadol for pain - due to severe back pain worse when standing. Feels the is not strong enough. She takes gabapentin 300mg  nightly. Day dosing caused sedation.  Has seen Dr Danielle Dess neurosurgeon for DISH - no surgical intervention.   Yesterday morning after BM she wiped and noted blood to on toilet paper. However blood didn't seem from perianal source. Dermatologist mentioned dermatitis to bottom. No further bleeding today.   COLONOSCOPY  05/2021 diverticulosis and patent colon anastomosis with some edema/narrowing, rpt 5 yrs Christella Hartigan)       Relevant past medical, surgical, family and social history reviewed and updated as indicated. Interim medical history since our last visit reviewed. Allergies and medications reviewed and updated. Outpatient Medications Prior to Visit  Medication Sig Dispense Refill   acetaminophen (TYLENOL) 500 MG tablet Take 2 tablets (1,000 mg total) by mouth daily.      albuterol (VENTOLIN HFA) 108 (90 Base) MCG/ACT inhaler Inhale 2 puffs into the lungs every 6 (six) hours as needed for wheezing. 18 g 6   amoxicillin (AMOXIL) 500 MG capsule TAKE 4 CAPSULES(2000 MG) BY MOUTH 1 TIME BEFORE DENTAL WORK FOR 1 DOSE 4 capsule 1   ascorbic acid (VITAMIN C) 500 MG tablet Take 1 tablet (500 mg total) by mouth daily.     Biotin 1000 MCG tablet Take 1,000 mcg by mouth daily.     buPROPion (WELLBUTRIN SR) 150 MG 12 hr tablet Take 1 tablet (150 mg total) by mouth every morning. 90 tablet 1   calcium carbonate (OSCAL) 1500 (600 Ca) MG TABS tablet Take 600 mg of elemental calcium by mouth daily.     carboxymethylcellulose (REFRESH PLUS) 0.5 % SOLN Place 1 drop into both eyes 3 (three) times daily as needed (dry eyes).     cephALEXin (KEFLEX) 250 MG capsule Take 1 capsule (250 mg total) by mouth daily. For UTI prevention, through urology (MacDiarmid)     Cholecalciferol (VITAMIN D) 50 MCG (2000 UT) tablet Take 2,000 Units by mouth daily.     docusate sodium (COLACE) 100 MG capsule Take 200 mg by mouth every evening.     ELIQUIS 5 MG TABS tablet TAKE 1 TABLET(5 MG) BY MOUTH TWICE DAILY 180 tablet 1   furosemide (LASIX) 40 MG tablet Take  1 tablet (40 mg total) by mouth daily. May take an extra tablet daily as needed for swelling. 135 tablet 2   gabapentin (NEURONTIN) 300 MG capsule Take 300 mg by mouth at bedtime.     ipratropium-albuterol (DUONEB) 0.5-2.5 (3) MG/3ML SOLN Take 3 mLs by nebulization every 6 (six) hours as needed. 360 mL 0   lovastatin (MEVACOR) 40 MG tablet TAKE 1 TABLET(40 MG) BY MOUTH AT BEDTIME 90 tablet 1   montelukast (SINGULAIR) 10 MG tablet TAKE 1 TABLET(10 MG) BY MOUTH AT BEDTIME 90 tablet 1   Multiple Vitamins-Minerals (PRESERVISION AREDS 2 PO) Take 2 capsules by mouth every evening.      polyethylene glycol (MIRALAX / GLYCOLAX) packet Take 17 g by mouth daily. 14 each 0   potassium chloride (KLOR-CON) 10 MEQ tablet TAKE 2 TABLETS(20 MEQ) BY MOUTH DAILY 180  tablet 1   traMADol (ULTRAM) 50 MG tablet TAKE 1 TABLET(50 MG) BY MOUTH TWICE DAILY AS NEEDED 60 tablet 3   traZODone (DESYREL) 50 MG tablet TAKE 1/2 TO 1 TABLET(25 TO 50 MG) BY MOUTH AT BEDTIME 30 tablet 6   TRELEGY ELLIPTA 200-62.5-25 MCG/ACT AEPB INHALE 1 PUFF INTO THE LUNGS DAILY 60 each 11   Vibegron (GEMTESA) 75 MG TABS Take 75 mg by mouth daily.     vitamin B-12 (CYANOCOBALAMIN) 500 MCG tablet Take 1 tablet (500 mcg total) by mouth 2 (two) times a week.     Zinc 50 MG CAPS Take 1 capsule (50 mg total) by mouth daily.  0   Facility-Administered Medications Prior to Visit  Medication Dose Route Frequency Provider Last Rate Last Admin   ondansetron (ZOFRAN) 4 mg in sodium chloride 0.9 % 50 mL IVPB  4 mg Intravenous Q6H PRN Barnett Abu, MD         Per HPI unless specifically indicated in ROS section below Review of Systems  Objective:  BP 134/68   Pulse 77   Temp 97.6 F (36.4 C) (Temporal)   Ht 5\' 4"  (1.626 m)   Wt 149 lb 4 oz (67.7 kg)   SpO2 94%   BMI 25.62 kg/m   Wt Readings from Last 3 Encounters:  04/24/23 149 lb 4 oz (67.7 kg)  03/30/23 150 lb 3.2 oz (68.1 kg)  03/13/23 147 lb (66.7 kg)      Physical Exam Vitals and nursing note reviewed.  Constitutional:      Appearance: Normal appearance. She is not ill-appearing.     Comments: Ambulates with cane  HENT:     Head: Normocephalic. No raccoon eyes or abrasion. Hair is normal.      Comments: Ecchymosis to left frontal scalp with bruising inferior to it No pain to palpation of nasal bridge, or maxillary or periorbital facial bones, no pain to palpation to forehead. Mild discomfort to palpation to ecchymosis at left frontal scalp    Mouth/Throat:     Mouth: Mucous membranes are moist.     Pharynx: Oropharynx is clear. No oropharyngeal exudate or posterior oropharyngeal erythema.  Eyes:     General:        Right eye: No discharge.        Left eye: No discharge.     Extraocular Movements: Extraocular movements  intact.     Conjunctiva/sclera: Conjunctivae normal.     Pupils: Pupils are equal, round, and reactive to light.  Genitourinary:    Comments:  Noninflamed external hemorrhoids  No anal fissure No active bleeding noted Skin:    General:  Skin is dry.     Findings: Erythema and rash present.     Comments:  Raw perianal skin and superior at bilateral medial upper buttock skin No skin tears or erosions noted  Neurological:     Mental Status: She is alert.  Psychiatric:        Mood and Affect: Mood normal.        Behavior: Behavior normal.       Lab Results  Component Value Date   NA 139 10/09/2022   CL 100 10/09/2022   K 3.9 10/09/2022   CO2 29 10/09/2022   BUN 14 10/09/2022   CREATININE 0.80 10/09/2022   GFRNONAA >60 10/09/2022   CALCIUM 9.2 10/09/2022   PHOS 3.5 11/15/2017   ALBUMIN 3.7 06/22/2022   GLUCOSE 139 (H) 10/09/2022     Assessment & Plan:   Problem List Items Addressed This Visit     Legally blind in left eye   BRBPR (bright red blood per rectum)    Anticipate skin source from either perianal raw skin and dermatitis or non-inflamed ext hemorrhoid noted today. No obvious ongoing bleed. Reassuring colonoscopy from 2022 reviewed. Rx anusol HC cream PRN. Update if ongoing for further evaluation.       Head injury - Primary    Head injury 10+d ago with resultant ecchymosis and subsequent bruising developing inferior to initial injury due to gravity.  Non focal neurological exam.  Fortunately no vision changes, no ongoing headache or other signs of more serious injury.  She is anticoagulated but I don't see need for head imaging.         Meds ordered this encounter  Medications   hydrocortisone (ANUSOL-HC) 2.5 % rectal cream    Sig: Place 1 Application rectally 2 (two) times daily.    Dispense:  30 g    Refill:  0   gabapentin (NEURONTIN) 100 MG capsule    Sig: Take 1 capsule (100 mg total) by mouth 2 (two) times daily as needed (back pain - for daytime  use).    Dispense:  60 capsule    Refill:  1    No orders of the defined types were placed in this encounter.   Patient Instructions  I think bruising is coming from head injury from last week - this should continue to heal over time, bruising may spread down through the skin of the face due to gravity.  For bleeding from bottom - may try anusol steroid cream sent to pharmacy. Let me know if recurrent bleeding.  Good to see you today.   Follow up plan: Return if symptoms worsen or fail to improve.  [name], MD

## 2023-04-24 NOTE — Patient Instructions (Addendum)
I think bruising is coming from head injury from last week - this should continue to heal over time, bruising may spread down through the skin of the face due to gravity.  For bleeding from bottom - may try anusol steroid cream sent to pharmacy. Let me know if recurrent bleeding.  Good to see you today.

## 2023-04-24 NOTE — Assessment & Plan Note (Signed)
Anticipate skin source from either perianal raw skin and dermatitis or non-inflamed ext hemorrhoid noted today. No obvious ongoing bleed. Reassuring colonoscopy from 2022 reviewed. Rx anusol HC cream PRN. Update if ongoing for further evaluation.

## 2023-04-26 ENCOUNTER — Ambulatory Visit: Payer: Medicare Other | Admitting: Physical Therapy

## 2023-04-28 ENCOUNTER — Ambulatory Visit (INDEPENDENT_AMBULATORY_CARE_PROVIDER_SITE_OTHER): Payer: Medicare Other

## 2023-04-28 DIAGNOSIS — I495 Sick sinus syndrome: Secondary | ICD-10-CM | POA: Diagnosis not present

## 2023-04-30 LAB — CUP PACEART REMOTE DEVICE CHECK
Battery Remaining Longevity: 26 mo
Battery Remaining Longevity: 25 mo
Battery Remaining Percentage: 25 %
Battery Remaining Percentage: 25 %
Battery Voltage: 2.87 V
Battery Voltage: 2.87 V
Brady Statistic AP VP Percent: 73 %
Brady Statistic AP VP Percent: 73 %
Brady Statistic AP VS Percent: 1 %
Brady Statistic AP VS Percent: 1 %
Brady Statistic AS VP Percent: 27 %
Brady Statistic AS VP Percent: 27 %
Brady Statistic AS VS Percent: 1 %
Brady Statistic AS VS Percent: 1 %
Brady Statistic RA Percent Paced: 73 %
Brady Statistic RA Percent Paced: 73 %
Brady Statistic RV Percent Paced: 99 %
Brady Statistic RV Percent Paced: 99 %
Date Time Interrogation Session: 20240719040028
Date Time Interrogation Session: 20240719054837
Implantable Lead Connection Status: 753985
Implantable Lead Connection Status: 753985
Implantable Lead Connection Status: 753985
Implantable Lead Connection Status: 753985
Implantable Lead Implant Date: 20170907
Implantable Lead Implant Date: 20170907
Implantable Lead Implant Date: 20170907
Implantable Lead Implant Date: 20170907
Implantable Lead Location: 753859
Implantable Lead Location: 753860
Implantable Lead Location: 753859
Implantable Lead Location: 753860
Implantable Pulse Generator Implant Date: 20170907
Implantable Pulse Generator Implant Date: 20170907
Lead Channel Impedance Value: 390 Ohm
Lead Channel Impedance Value: 540 Ohm
Lead Channel Impedance Value: 390 Ohm
Lead Channel Impedance Value: 540 Ohm
Lead Channel Pacing Threshold Amplitude: 0.625 V
Lead Channel Pacing Threshold Amplitude: 0.75 V
Lead Channel Pacing Threshold Amplitude: 0.625 V
Lead Channel Pacing Threshold Amplitude: 0.75 V
Lead Channel Pacing Threshold Pulse Width: 0.5 ms
Lead Channel Pacing Threshold Pulse Width: 0.5 ms
Lead Channel Pacing Threshold Pulse Width: 0.5 ms
Lead Channel Pacing Threshold Pulse Width: 0.5 ms
Lead Channel Sensing Intrinsic Amplitude: 2.7 mV
Lead Channel Sensing Intrinsic Amplitude: 7.4 mV
Lead Channel Sensing Intrinsic Amplitude: 2.7 mV
Lead Channel Sensing Intrinsic Amplitude: 7.4 mV
Lead Channel Setting Pacing Amplitude: 0.875
Lead Channel Setting Pacing Amplitude: 2 V
Lead Channel Setting Pacing Amplitude: 0.875
Lead Channel Setting Pacing Amplitude: 2 V
Lead Channel Setting Pacing Pulse Width: 0.5 ms
Lead Channel Setting Pacing Pulse Width: 0.5 ms
Lead Channel Setting Sensing Sensitivity: 2 mV
Lead Channel Setting Sensing Sensitivity: 2 mV
Pulse Gen Model: 2272
Pulse Gen Model: 2272
Pulse Gen Serial Number: 7945283
Pulse Gen Serial Number: 7945283

## 2023-05-07 ENCOUNTER — Encounter: Payer: Self-pay | Admitting: Emergency Medicine

## 2023-05-07 ENCOUNTER — Other Ambulatory Visit: Payer: Self-pay

## 2023-05-07 ENCOUNTER — Emergency Department
Admission: EM | Admit: 2023-05-07 | Discharge: 2023-05-07 | Disposition: A | Discharge status: Home / Self Care | Payer: Medicare Other | Attending: Emergency Medicine | Admitting: Emergency Medicine

## 2023-05-07 ENCOUNTER — Encounter: Payer: Self-pay | Admitting: Cardiovascular Disease

## 2023-05-07 DIAGNOSIS — J449 Chronic obstructive pulmonary disease, unspecified: Secondary | ICD-10-CM | POA: Insufficient documentation

## 2023-05-07 DIAGNOSIS — Z7901 Long term (current) use of anticoagulants: Secondary | ICD-10-CM | POA: Diagnosis not present

## 2023-05-07 DIAGNOSIS — N3001 Acute cystitis with hematuria: Secondary | ICD-10-CM | POA: Diagnosis not present

## 2023-05-07 DIAGNOSIS — I10 Essential (primary) hypertension: Secondary | ICD-10-CM | POA: Insufficient documentation

## 2023-05-07 DIAGNOSIS — R319 Hematuria, unspecified: Secondary | ICD-10-CM | POA: Diagnosis present

## 2023-05-07 LAB — CBC WITH DIFFERENTIAL/PLATELET
Abs Immature Granulocytes: 0.01 10*3/uL (ref 0.00–0.07)
Basophils Absolute: 0 10*3/uL (ref 0.0–0.1)
Basophils Relative: 1 %
Eosinophils Absolute: 0.2 10*3/uL (ref 0.0–0.5)
Eosinophils Relative: 4 %
HCT: 36.1 % (ref 36.0–46.0)
Hemoglobin: 11.5 g/dL — ABNORMAL LOW (ref 12.0–15.0)
Immature Granulocytes: 0 %
Lymphocytes Relative: 6 %
Lymphs Abs: 0.3 10*3/uL — ABNORMAL LOW (ref 0.7–4.0)
MCH: 32.6 pg (ref 26.0–34.0)
MCHC: 31.9 g/dL (ref 30.0–36.0)
MCV: 102.3 fL — ABNORMAL HIGH (ref 80.0–100.0)
Monocytes Absolute: 0.6 10*3/uL (ref 0.1–1.0)
Monocytes Relative: 12 %
Neutro Abs: 4.2 10*3/uL (ref 1.7–7.7)
Neutrophils Relative %: 77 %
Platelets: 76 10*3/uL — ABNORMAL LOW (ref 150–400)
RBC: 3.53 MIL/uL — ABNORMAL LOW (ref 3.87–5.11)
RDW: 14 % (ref 11.5–15.5)
WBC: 5.4 10*3/uL (ref 4.0–10.5)
nRBC: 0 % (ref 0.0–0.2)

## 2023-05-07 LAB — BASIC METABOLIC PANEL
Anion gap: 9 (ref 5–15)
BUN: 15 mg/dL (ref 8–23)
CO2: 29 mmol/L (ref 22–32)
Calcium: 9.1 mg/dL (ref 8.9–10.3)
Chloride: 102 mmol/L (ref 98–111)
Creatinine, Ser: 1.04 mg/dL — ABNORMAL HIGH (ref 0.44–1.00)
GFR, Estimated: 55 mL/min — ABNORMAL LOW (ref >60)
Glucose, Bld: 121 mg/dL — ABNORMAL HIGH (ref 70–99)
Potassium: 3.5 mmol/L (ref 3.5–5.1)
Sodium: 140 mmol/L (ref 135–145)

## 2023-05-07 LAB — URINALYSIS, ROUTINE W REFLEX MICROSCOPIC
Bacteria, UA: NONE SEEN
RBC / HPF: >50 RBC/hpf (ref 0–5)
Squamous Epithelial / HPF: >50 /HPF (ref 0–5)
WBC, UA: >50 WBC/hpf (ref 0–5)

## 2023-05-07 LAB — PROTIME-INR
INR: 1.6 — ABNORMAL HIGH (ref 0.8–1.2)
Prothrombin Time: 18.9 seconds — ABNORMAL HIGH (ref 11.4–15.2)

## 2023-05-07 MED ORDER — CEPHALEXIN 500 MG PO CAPS: 500.0000 mg | ORAL_CAPSULE | Freq: Three times a day (TID) | ORAL | 0 refills | Status: AC

## 2023-05-07 NOTE — ED Notes (Signed)
See triage note   Presents with some blood in urine  States she noticed this Friday evening and all day yesterday Denies any pain

## 2023-05-07 NOTE — Discharge Instructions (Signed)
Please follow up with primary care or the urologist.  Call Cardiology to let them know you are on an antibiotic. They may want to adjust your warfarin dosage.  Return to the ER for symptoms that change or worsen if unable to schedule an appointment.

## 2023-05-07 NOTE — ED Triage Notes (Signed)
Pt states coming in with blood in her urine that started several days ago, resolved and came back. Pt states she did not want to wait to go to PCP tomorrow. Pt denies burning with urination or pain with urination.

## 2023-05-07 NOTE — ED Provider Notes (Signed)
[facility] Provider Note    Event Date/Time   First MD Initiated Contact with Patient 05/07/23 0930     (approximate)   History   Hematuria   HPI  [name] is a 77 y.o. female  with history of hypertension, COPD, a-fib on warfarin and as listed in EMR presents to the emergency department for evaluation of hematuria that started yesterday. She denies fever, urinary frequency, dysuria, fever, abdominal or back pain.      Physical Exam   Triage Vital Signs: ED Triage Vitals [05/07/23 0927]  Encounter Vitals Group     BP 136/77     Systolic BP Percentile      Diastolic BP Percentile      Pulse Rate 95     Resp 18     Temp 98.4 F (36.9 C)     Temp src      SpO2 100 %     Weight 145 lb 12.8 oz (66.1 kg)     Height 5\' 4"  (1.626 m)     Head Circumference      Peak Flow      Pain Score 0     Pain Loc      Pain Education      Exclude from Growth Chart     Most recent vital signs: Vitals:   05/07/23 0927  BP: 136/77  Pulse: 95  Resp: 18  Temp: 98.4 F (36.9 C)  SpO2: 100%    General: Awake, no distress.  CV:  Good peripheral perfusion.  Resp:  Normal effort.  Abd:  No distention. No suprapubic tenderness Other:  No suprapubic tenderness. No CVA tenderness.   ED Results / Procedures / Treatments   Labs (all labs ordered are listed, but only abnormal results are displayed) Labs Reviewed  URINALYSIS, ROUTINE W REFLEX MICROSCOPIC - Abnormal; Notable for the following components:      Result Value   Color, Urine RED (*)    APPearance CLOUDY (*)    Glucose, UA   (*)    Value: TEST NOT REPORTED DUE TO COLOR INTERFERENCE OF URINE PIGMENT   Hgb urine dipstick   (*)    Value: TEST NOT REPORTED DUE TO COLOR INTERFERENCE OF URINE PIGMENT   Bilirubin Urine   (*)    Value: TEST NOT REPORTED DUE TO COLOR INTERFERENCE OF URINE PIGMENT   Ketones, ur   (*)    Value: TEST NOT REPORTED DUE TO COLOR INTERFERENCE OF URINE PIGMENT    Protein, ur   (*)    Value: TEST NOT REPORTED DUE TO COLOR INTERFERENCE OF URINE PIGMENT   Nitrite   (*)    Value: TEST NOT REPORTED DUE TO COLOR INTERFERENCE OF URINE PIGMENT   Leukocytes,Ua   (*)    Value: TEST NOT REPORTED DUE TO COLOR INTERFERENCE OF URINE PIGMENT   All other components within normal limits  CBC WITH DIFFERENTIAL/PLATELET - Abnormal; Notable for the following components:   RBC 3.53 (*)    Hemoglobin 11.5 (*)    MCV 102.3 (*)    Platelets 76 (*)    Lymphs Abs 0.3 (*)    All other components within normal limits  BASIC METABOLIC PANEL - Abnormal; Notable for the following components:   Glucose, Bld 121 (*)    Creatinine, Ser 1.04 (*)    GFR, Estimated 55 (*)    All other components within normal limits  PROTIME-INR - Abnormal; Notable for the following components:  Prothrombin Time 18.9 (*)    INR 1.6 (*)    All other components within normal limits  URINE CULTURE     EKG     RADIOLOGY  Image and radiology report reviewed and interpreted by me. Radiology report consistent with the same.  Not indicated.  PROCEDURES:  Critical Care performed: No  Procedures   MEDICATIONS ORDERED IN ED:  Medications - No data to display   IMPRESSION / MDM / ASSESSMENT AND PLAN / ED COURSE   I have reviewed the triage note.  Differential diagnosis includes, but is not limited to, hematuria, bladder stone, interstitial cystitis, acute cystitis, bladder cancer  Patient's presentation is most consistent with acute complicated illness / injury requiring diagnostic workup.   77 year old female presents to the emergency department for treatment of hematuria. See HPI.  Urine is grossly bloody without clots. Plan will be to get urinalysis and labs including PT INR since she is on warfarin. (Patient states she is on warfarin, but I see Eliquis listed in her chart).   Urine specimen shows WBC clumps and greater than 50 WBC but also has squamous epithelial cells.  Culture added. Plan will be to treat her with 3 days of Keflex and have her follow up with primary care or her urologist. She is to return to the ER for symptoms that change or worsen.     FINAL CLINICAL IMPRESSION(S) / ED DIAGNOSES   Final diagnoses:  Acute cystitis with hematuria     Rx / DC Orders   ED Discharge Orders          Ordered    cephALEXin (KEFLEX) 500 MG capsule  3 times daily        05/07/23 1038             Note:  This document was prepared using Dragon voice recognition software and may include unintentional dictation errors.   Chinita Pester, FNP 05/07/23 1057    Jene Every, MD 05/07/23 1134

## 2023-05-08 ENCOUNTER — Telehealth: Payer: Self-pay

## 2023-05-08 NOTE — Telephone Encounter (Signed)
Antibiotics will  not interfere with eliquis

## 2023-05-08 NOTE — Transitions of Care (Post Inpatient/ED Visit) (Signed)
05/08/2023  Name: [name] MRN: [id number] DOB: 1946-07-13  Today's TOC FU Call Status: Today's TOC FU Call Status:: Successful TOC FU Call Competed TOC FU Call Complete Date: 05/08/23  Transition Care Management Follow-up Telephone Call Date of Discharge: 05/07/23 Discharge Facility: [facility]) Type of Discharge: Emergency Department Reason for ED Visit: Other: (Acute cystitis with hematuria) How have you been since you were released from the hospital?: Better Any questions or concerns?: No  Items Reviewed: Did you receive and understand the discharge instructions provided?: Yes Medications obtained,verified, and reconciled?: Yes (Medications Reviewed) Any new allergies since your discharge?: No Dietary orders reviewed?: Yes Do you have support at home?: Yes  Medications Reviewed Today: Medications Reviewed Today     Reviewed by Merleen Nicely, LPN (Licensed Practical Nurse) on 05/08/23 at 1422  Med List Status: <None>   Medication Order Taking? Sig Documenting Provider Last Dose Status Informant  acetaminophen (TYLENOL) 500 MG tablet [id number] Yes Take 2 tablets (1,000 mg total) by mouth daily. [name], MD Taking Active   albuterol (VENTOLIN HFA) 108 [phone] Base) MCG/ACT inhaler [id number] Yes Inhale 2 puffs into the lungs every 6 (six) hours as needed for wheezing. Copland, Karleen Hampshire, MD Taking Active   ascorbic acid (VITAMIN C) 500 MG tablet [id number] Yes Take 1 tablet (500 mg total) by mouth daily. [name], MD Taking Active   Biotin 1000 MCG tablet [id number] Yes Take 1,000 mcg by mouth daily. [provider] Taking Active Self  buPROPion (WELLBUTRIN SR) 150 MG 12 hr tablet [id number] Yes Take 1 tablet (150 mg total) by mouth every morning. [name], MD Taking Active   calcium carbonate (OSCAL) 1500 (600 Ca) MG TABS tablet [id number] Yes Take 600 mg of elemental calcium by mouth daily. [provider] Taking Active Self  carboxymethylcellulose (REFRESH PLUS) 0.5 % SOLN [id number] Yes Place 1 drop into both eyes 3 (three) times daily as needed (dry eyes). [provider] Taking Active Self  cephALEXin (KEFLEX) 500 MG capsule [id number] Yes Take 1 capsule (500 mg total) by mouth 3 (three) times daily for 3 days. Kem Boroughs B, FNP Taking Active   Cholecalciferol (VITAMIN D) 50 MCG (2000 UT) tablet [id number] Yes Take 2,000 Units by mouth daily. [provider] Taking Active Self  docusate sodium (COLACE) 100 MG capsule [id number] Yes Take 200 mg by mouth every evening. [provider] Taking Active Self  ELIQUIS 5 MG TABS tablet [id number] Yes TAKE 1 TABLET(5 MG) BY MOUTH TWICE DAILY Croitoru, Mihai, MD Taking Active   furosemide (LASIX) 40 MG tablet [id number] Yes Take 1 tablet (40 mg total) by mouth daily. May take an extra tablet daily as needed for swelling. Croitoru, Mihai, MD Taking Active   gabapentin (NEURONTIN) 100 MG capsule [id number] Yes Take 1 capsule (100 mg total) by mouth 2 (two) times daily as needed (back pain - for daytime use). [name], MD Taking Active   gabapentin (NEURONTIN) 300 MG capsule [id number] Yes Take 300 mg by mouth at bedtime. [provider] Taking Active Self           Med Note Casimiro Needle, [facility] J   Fri Nov 19, 2021  9:06 AM) Can take it 3 times a day  hydrocortisone (ANUSOL-HC) 2.5 % rectal cream [id number] Yes Place 1 Application rectally 2 (two) times daily. [name], MD Taking Active   ipratropium-albuterol (DUONEB) 0.5-2.5 (3) MG/3ML SOLN [id number] Yes Take 3 mLs by nebulization every 6 (six) hours  as needed. Pecola Lawless, MD Taking Active Self  lovastatin (MEVACOR) 40 MG tablet [id number] Yes TAKE 1 TABLET(40 MG) BY MOUTH AT BEDTIME [name], MD Taking Active   montelukast (SINGULAIR) 10 MG tablet [id number] Yes TAKE 1 TABLET(10 MG) BY MOUTH AT BEDTIME [name], MD Taking Active    Multiple Vitamins-Minerals (PRESERVISION AREDS 2 PO) [id number] Yes Take 2 capsules by mouth every evening.  [provider] Taking Active Self  polyethylene glycol (MIRALAX / GLYCOLAX) packet [id number] Yes Take 17 g by mouth daily. Dwana Melena, PA-C Taking Active            Med Note Marcelina Morel Mar 30, 2023 10:04 AM) Patient takes as needed  potassium chloride (KLOR-CON) 10 MEQ tablet [id number] Yes TAKE 2 TABLETS(20 MEQ) BY MOUTH DAILY Croitoru, Mihai, MD Taking Active   traMADol (ULTRAM) 50 MG tablet [id number] Yes TAKE 1 TABLET(50 MG) BY MOUTH TWICE DAILY AS NEEDED [name], MD Taking Active   traZODone (DESYREL) 50 MG tablet [id number] Yes TAKE 1/2 TO 1 TABLET(25 TO 50 MG) BY MOUTH AT BEDTIME [name], MD Taking Active   TRELEGY ELLIPTA 200-62.5-25 MCG/ACT AEPB [id number] Yes INHALE 1 PUFF INTO THE LUNGS DAILY [name], MD Taking Active   Vibegron (GEMTESA) 75 MG TABS [id number] Yes Take 75 mg by mouth daily. [provider] Taking Active   vitamin B-12 (CYANOCOBALAMIN) 500 MCG tablet [id number] Yes Take 1 tablet (500 mcg total) by mouth 2 (two) times a week. [name], MD Taking Active   Zinc 50 MG CAPS [id number] Yes Take 1 capsule (50 mg total) by mouth daily. [name], MD Taking Active Self            Home Care and Equipment/Supplies: Were Home Health Services Ordered?: NA Any new equipment or medical supplies ordered?: NA  Functional Questionnaire: Do you need assistance with bathing/showering or dressing?: No Do you need assistance with meal preparation?: No Do you need assistance with eating?: No Do you have difficulty maintaining continence: No Do you need assistance with getting out of bed/getting out of a chair/moving?: No Do you have difficulty managing or taking your medications?: No  Follow up appointments reviewed: PCP Follow-up appointment confirmed?: No MD Provider Line Number:[phone]  Given: Yes Specialist Hospital Follow-up appointment confirmed?: Yes Date of Specialist follow-up appointment?: 05/10/23 Follow-Up Specialty Provider:: urologist Do you need transportation to your follow-up appointment?: No Do you understand care options if your condition(s) worsen?: Yes-patient verbalized understanding    SIGNATURE  Woodfin Ganja LPN [facility] Nurse Health Advisor Direct Dial [phone]

## 2023-05-10 ENCOUNTER — Ambulatory Visit: Payer: Medicare Other | Admitting: Podiatry

## 2023-05-11 NOTE — Progress Notes (Signed)
Remote pacemaker transmission.   

## 2023-05-25 ENCOUNTER — Encounter (INDEPENDENT_AMBULATORY_CARE_PROVIDER_SITE_OTHER): Payer: Self-pay

## 2023-05-29 ENCOUNTER — Ambulatory Visit (HOSPITAL_COMMUNITY)
Admission: RE | Admit: 2023-05-29 | Discharge: 2023-05-29 | Disposition: A | Discharge status: Home / Self Care | Payer: Medicare Other | Source: Ambulatory Visit | Attending: Internal Medicine | Admitting: Internal Medicine

## 2023-05-29 DIAGNOSIS — R918 Other nonspecific abnormal finding of lung field: Secondary | ICD-10-CM | POA: Diagnosis present

## 2023-05-29 DIAGNOSIS — J449 Chronic obstructive pulmonary disease, unspecified: Secondary | ICD-10-CM | POA: Insufficient documentation

## 2023-06-02 NOTE — Progress Notes (Signed)
Will discuss at visit 06/05/23 Xxx  IMPRESSION: 1. 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 2. 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 3. Aortic atherosclerosis, in addition to left main and three-vessel coronary artery disease. Assessment for potential risk factor modification, dietary therapy or pharmacologic therapy may be warranted, if clinically indicated. 4. Trace bilateral pleural effusions, unchanged. 5. Additional incidental findings, as above.  Aortic Atherosclerosis (ICD10-I70.0).   Electronically Signed   By: Trudie Reed [name]   On: 06/01/2023 10:40

## 2023-06-05 ENCOUNTER — Ambulatory Visit (INDEPENDENT_AMBULATORY_CARE_PROVIDER_SITE_OTHER): Payer: Medicare Other | Admitting: Family Medicine

## 2023-06-05 ENCOUNTER — Encounter: Payer: Self-pay | Admitting: Family Medicine

## 2023-06-05 ENCOUNTER — Encounter: Payer: Self-pay | Admitting: Internal Medicine

## 2023-06-05 ENCOUNTER — Ambulatory Visit (INDEPENDENT_AMBULATORY_CARE_PROVIDER_SITE_OTHER): Payer: Medicare Other | Admitting: Internal Medicine

## 2023-06-05 VITALS — BP 108/64 | HR 87 | Temp 97.8°F | Ht 64.0 in | Wt 151.0 lb

## 2023-06-05 VITALS — BP 126/74 | HR 74 | Ht 64.0 in | Wt 150.8 lb

## 2023-06-05 DIAGNOSIS — R2689 Other abnormalities of gait and mobility: Secondary | ICD-10-CM

## 2023-06-05 DIAGNOSIS — L97909 Non-pressure chronic ulcer of unspecified part of unspecified lower leg with unspecified severity: Secondary | ICD-10-CM | POA: Diagnosis not present

## 2023-06-05 DIAGNOSIS — R229 Localized swelling, mass and lump, unspecified: Secondary | ICD-10-CM | POA: Diagnosis not present

## 2023-06-05 DIAGNOSIS — R918 Other nonspecific abnormal finding of lung field: Secondary | ICD-10-CM | POA: Diagnosis not present

## 2023-06-05 DIAGNOSIS — R768 Other specified abnormal immunological findings in serum: Secondary | ICD-10-CM

## 2023-06-05 DIAGNOSIS — L97921 Non-pressure chronic ulcer of unspecified part of left lower leg limited to breakdown of skin: Secondary | ICD-10-CM | POA: Diagnosis not present

## 2023-06-05 DIAGNOSIS — R54 Age-related physical debility: Secondary | ICD-10-CM

## 2023-06-05 DIAGNOSIS — J449 Chronic obstructive pulmonary disease, unspecified: Secondary | ICD-10-CM | POA: Diagnosis not present

## 2023-06-05 DIAGNOSIS — I83009 Varicose veins of unspecified lower extremity with ulcer of unspecified site: Secondary | ICD-10-CM

## 2023-06-05 LAB — SEDIMENTATION RATE: Sed Rate: 55 mm/hr — ABNORMAL HIGH (ref 0–30)

## 2023-06-05 MED ORDER — CEPHALEXIN 500 MG PO CAPS: 500.0000 mg | ORAL_CAPSULE | Freq: Three times a day (TID) | ORAL | 0 refills | Status: DC

## 2023-06-05 MED ORDER — TRELEGY ELLIPTA 200-62.5-25 MCG/ACT IN AEPB: 1.0000 | INHALATION_SPRAY | Freq: Every day | RESPIRATORY_TRACT | 11 refills | Status: DC

## 2023-06-05 MED ORDER — ALBUTEROL SULFATE HFA 108 (90 BASE) MCG/ACT IN AERS: 2.0000 | INHALATION_SPRAY | Freq: Four times a day (QID) | RESPIRATORY_TRACT | 6 refills | Status: DC | PRN

## 2023-06-05 NOTE — Patient Instructions (Addendum)
Continue daily to every other day dressing changes with antibiotic ointment, nonstick gauze, fluffy kerlex and ace wrap.  We will refer you for arterial circulation evaluation.  We will refer you for home health physical therapy and wound care e

## 2023-06-05 NOTE — Progress Notes (Signed)
PCP [name], MD  HPI    OV 08/02/2017  Chief Complaint  Patient presents with   Follow-up    Pt states that she got bronchitis x1 month ago. Has been on two rounds of abx and two rounds of steroids. States that it is not in her chest anymore but is still having drainage, sore throat, dry cough, occ. SOB. Denies any CP.    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 She comes for routine follow-up. Recently she had COPD flareup was treated by primary care. Then I called in antibiotic and repeat prednisone and this again helped her. Nevertheless she is frustrated by postnasal drainage and the resultant cough. She says normally for COPD exacerbations are associated with initial onset of sinus congestion. Then she's always left with a residual postnasal drip but this time it has lingered longer. She says Sudafed helps her but is no longer available over-the-counter. She really wants relief about this critically. COPD cat score is 14. Is also asking me to sign handicap parking sticker   OV 10/31/2017  Chief Complaint  Patient presents with   Follow-up    In hosp. 11/13-11/15 due to sepsis from UTI. Pt states she is doing good but can't seem to get her energy back. Pt currently has laryngitis and SOB with exertion.    Follow-up Gold stage II/III COPD not on home oxygen but on triple inhaler therapy.  Is a routine follow-up.  Since I last saw her in the middle of November 2018 she got diagnosed with E. coli sepsis and admitted to the hospital.  Since then she has significant amount of fatigue.  The fatigue is not improved much.  She has suspended her maintenance pulmonary rehabilitation but plans to restart it in March 2019.  She has not had any COPD exacerbations.  She is up-to-date with the flu shot.  Overall COPD CAT score is stable but there is an increase in the level of fatigue.  The only new issue that she complains about is some mild hoarseness  in voice going on for several months.  This is present early in the morning when she wakes up and then as the day goes on it is better.  She notices it when she talks a lot of time in the phone after a long period of rest.  It is insidious onset.  It is not progressive.  There are no other associated symptoms.   OV 02/05/2018  Chief Complaint  Patient presents with   Follow-up    Pt states she saw PCP two weeks ago and had pna. Pt states she is still not over the pna and has no energy, has hoarseness in voice, cough, and has chest tightness.     Follow-up gold stage II/III COPD not on home oxygen but on triple inhaler therapy with Singulair  Routine follow-up.  She tells me t of labs from January 22, 2018 hat January 22, 2018 she started feeling chest tightness wheezing and also increase head cold.  She saw her primary care physician Dr. Sharen Hones.  Apparently all the blood work was normal except for mild increase in liver enzymes which she says she fluctuates with.  He was initially thinking a sinus congestion but when he did an x-ray of the report came back as a right lower lobe pneumonia [I personally visualized this x-ray and I agree she might not have right lower lobe pneumonia because in 2015 CT chest  she had some chronic scarring at that area].  She was treated with 5-day Levaquin and a Medrol Dosepak but despite this other than subjective fevers are resolving she still has significant cough congestion and chest tightness without any orthopnea proximal nocturnal dyspnea.  COPD CAT score shows significant deterioration.  She is not feeling well.  Of note, she is intentionally losing weight because of weight watchers but she has not increased her fluid intake adequately.  She has quit all diet soda  Show slight increase in liver enzymes and also slight increase in creatinine compared to 6 months ago.   OV 03/13/2018  Chief Complaint  Patient presents with   Follow-up    Breathing is improved  since last OV. Still reports DOE and occasional cough. Denies chest tightness or wheezing.    Marcee J  presents for follow-up of her gold stage II /3 of COPD.  On triple inhaler therapy with Spiriva and Brio and also on Singulair   Overall in terms of her COPD: She is stable.  She is compliant with her inhalers.  She is not on oxygen therapy.  Her symptoms actually improved.  In the interim she was diagnosed with atrial fibrillation and she is placed on Xarelto.  She is frustrated because she has to be on 1 more extra medication.  She did have pulmonary function test that is documented below because it has been a while since we did one.  It actually shows improvement in the lung function.  She also had a CT scan of the chest because the last one was in 2015.  This shows scattered lung nodules with the largest one 8 mm in the right lower lobe.  The radiologist has described this is nonspecific in appearance.  She heard the result and she has been extremely anxious and upset.  She is worried that her colon cancer has metastasized or she has lung cancer.  Of note, I personally visualized the CT chest and agree with the findings   OV 06/27/2018  Subjective:  Patient ID: Robin Searing, female , DOB: September 20, 1946 , age 77 y.o. , MRN: [id number] , ADDRESS: [address] [place] [address]   06/27/2018 -   Chief Complaint  Patient presents with   Follow-up    Pt states her breathing has been doing good since last visit. States she has been having some BP issues to the point she fainted and cracked some ribs.  Denies any problems with cough or CP.     HPI Mirha S Folds 77 y.o. - follow-up for  - COPD: Overall this is stable with inhaler therapy. She has not had her flu shot. She wants to wait 2 weeks because she just had first shot of her shingles vaccine. She is up-to-date with her pneumonia vaccine. In the interim she has lost weight 60 pounds intentionally and this is improved on  Score  Multiple  Lung nodules: She had follow-up CT chest in September 2019 and compared to May 2019: One of the nodules is resolved to 16 mm and stable. She does have some rib fractures subacute and this is because she fell down. She's been having balance issues with the weight loss and orthostatic hypotension. Both of this is being managed by neurosurgery and primary care  Travel advice: She plans to have back surgery for her foot drop after this she plans to recover at her son's home near [place]. Therefore she wants a antibiotic and prednisone taper to be  handy with her in case of a future COPD exacerbation.    Results for HUSNA, BRASILE (MRN [id number]) as of 03/13/2018 11:58  Ref. Range 11/29/2013 16:04 04/01/2016 10:10 03/13/2018 10:36  FEV1-Pre Latest Units: L 0.93 1.35 1.42  FEV1-%Pred-Pre Latest Units: % 34 55 59   Results for NEPHTALIE, JAROS (MRN [id number]) as of 03/13/2018 11:58  Ref. Range 03/13/2018 10:36  DLCO cor Latest Units: ml/min/mmHg 22.70  DLCO cor % pred Latest Units: % 84     CT chest Sept 4, 2019 Lungs/Pleura: Pleural thickening posteriorly in the right hemithorax is stable. There is no significant pleural effusion. There is stable subpleural scarring posteriorly in the right lower lobe. There is central airway thickening with scattered tree-in-bud nodularity in both lungs, similar to the most recent prior study. Nodular scarring in both upper lobes is stable. The right lower lobe perifissural nodule present on the most recent study has resolved. There is a stable 6 x 5 mm right lower lobe nodule on image 119/3. No new or enlarging pulmonary nodules.    IMPRESSION: 1. Generally stable chronic lung disease with pleuroparenchymal scarring and scattered nodularity. Most of these nodules have a tree-in-bud appearance, likely post infectious or inflammatory. The dominant right lower lobe nodule seen on the most recent study has resolved. No new or enlarging  nodules. 2. No acute chest findings. Healing subacute right-sided rib fractures. 3. Extensive Aortic Atherosclerosis (ICD10-I70.0).     Electronically Signed   By: Carey Bullocks [name]   On: 06/13/2018 14    ROS - per HPI  OV 01/22/2020  Subjective:  Patient ID: Teyla Quin Hoop, female , DOB: 10-17-1945 , age 56 y.o. , MRN: [id number] , ADDRESS: [address] [place] [address]   01/22/2020 -   Chief Complaint  Patient presents with   Follow-up    Pt states she has been doing good since last visit and denies any complaints.     HPI Tavi Lisia Desarro 77 y.o. -presents for follow-up.  Last seen in 2019.  After that because of the pandemic have not seen her.  She saw the nurse practitioner once.  The big interval changes that she has lost a lot of weight.  It is intentional because she is try to keep her self it.  From a functional standpoint COPD symptoms are much improved after weight loss.  She takes Spiriva Singulair and Breo.  However she is having significant back pain.  Other than that she feels fine.  She not able to do pulmonary rehab because of the back pain.  She has had a Covid vaccine  Other issues lung nodules.  Her last CT scan of the chest was in July 2020.  She has waxing and waning.  She is willing to have another CT scan of the chest at the appropriate time interval.      CAT COPD Symptom & Quality of Life Score (GSK trademark) 0 is no burden. 5 is highest burden 08/02/2017  10/31/2017  02/05/2018  06/27/2018 60+ # intentioal weight loss  Never Cough -> Cough all the time 1 2 4 1   No phlegm in chest -> Chest is full of phlegm 0 0 3 0  No chest tightness -> Chest feels very tight 1 0 3 0  No dyspnea for 1 flight stairs/hill -> Very dyspneic for 1 flight of stairs 5 4 5 4   No limitations for ADL at home -> Very limited with ADL at home 1 0  5 3  Confident leaving home -> Not at all confident leaving home 0 0 3 1  Sleep soundly -> Do not sleep  soundly because of lung condition 4 4 3 3   Lots of Energy -> No energy at all 2 3 5 4   TOTAL Score (max 40)  14 13 31 16    CAT Score 01/22/2020  Total CAT Score 9      ROS - per HPI    OV 07/14/2020  Subjective:  Patient ID: Lachell Quin Hoop, female , DOB: 03/11/1946 , age 5 y.o. , MRN: [id number] , ADDRESS: [address] [place] [address]  PCP [name], MD    07/14/2020 -   Chief Complaint  Patient presents with   Follow-up    no complaints    Advanced COPD on Spiriva and Breo and Singulair Multiple lung nodules with waxing and waning quality -last CT scan July 2020  HPI Dorena Clementene Montilla 77 y.o. -presents for follow-up of her COPD.  Last seen in early part of this year.  She is supposed to have a follow-up CT scan of the chest but this has not happened.  Patient tells me that she wants to hold off on getting the CT scan of the chest right now but will do it at the time of follow-up.  Last CT scan of the chest was in July 2020.  At this point in time she continues on Spiriva, Breo and Singulair.  She feels well.  She has been social distancing.  She has had her Covid vaccine but will have a booster next week.  She will have a flu shot today but at the Coumadin clinic.  There are no active complaints.  She is interested in consolidating her Spiriva and Breo and to Trelegy.  She denies any recent exacerbations.   CAT Score 07/14/2020 01/22/2020  Total CAT Score 6 9     No flowsheet data found.   No flowsheet data found.      OV 02/14/2022  Subjective:  Patient ID: Annalea Quin Hoop, female , DOB: 09-01-1946 , age 61 y.o. , MRN: [id number] , ADDRESS: [address] [place] [address] PCP [name], MD Patient Care Team: [name], MD as PCP - General (Family Medicine) Thurmon Fair, MD as PCP - Cardiology (Cardiology) [name], MD as Consulting Physician (Gastroenterology) Dorothy Puffer, MD as Consulting  Physician (Radiation Oncology) Laurice Record, MD as Consulting Physician (Hematology and Oncology) Susa Griffins, MD (Inactive) as Consulting Physician (Cardiology) [name], MD as Consulting Physician (Pulmonary Disease) [name], MD (General Surgery) Thurmon Fair, MD as Consulting Physician (Cardiology) Mckinley Jewel, MD as Consulting Physician (Ophthalmology) [place], Amy, MD as Consulting Physician (Dermatology) Drema Dallas, DO as Consulting Physician (Neurology)  This Provider for this visit: Treatment Team:  Attending Provider: [name], MD    02/14/2022 -   Chief Complaint  Patient presents with   Follow-up    Pt is here following up after recent CT. Pt states she has been doing okay since last visit.   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 Multiple lung nodules with waxing and waning quality  -last CT scanApril 2023 and Nov 2022  COVID-19 in February 2023  HPI Tamsen Lakeyda Bazzle 77 y.o. -presents for follow-up to discuss her CT results.  Shortly after the CT scan was done she was alarmed with the results.  She is really worried  she has cancer.  Did discuss with her the option of coming here for follow-up today versus getting a surgical appointment.  She opted to take surgical appointment.  I reviewed the CT scan visualized with her.  This looks like waxing and waning nodules.  In between February 2020 she also had COVID-19.  The images are not classic for COVID but did indicate to her the infiltrates could be worse because of COVID.  She has appointment Dr. Dorris Fetch tomorrow.  She is really worried about malignancy.  I did indicate to her that the nodules may not be amenable for biopsy or she might be considered high risk for biopsy.  We discussed the option of canceling appointment Dr. Dorris Fetch tomorrow but she is decided to keep it.  Discussed doing some work-up for the  nodules that are noninvasive such as checking for immunoglobulins and autoimmune profile and QuantiFERON gold.  She is open to this idea.  She is very anxious about this illness because she does live alone.  Husband passed away and one of her sons passed away.  She has another son who is involved in her care  Overall from a symptom standpoint she feels well.  She takes her inhalers and nebulizers correctly.   She is also worried about the 4.2 cm ascending thoracic aortic aneurysm.  She believes this is new.  I did indicate that she could discussed with Dr. Dorris Fetch but did indicate that according to radiology has been stable.  I believe her cardiologist follows her for this.    CT Chest data 01/20/22  Narrative & Impression  CLINICAL DATA:  Pulmonary nodules. COPD. Bronchitis. Personal history of rectal cancer in 2012.   EXAM: CT CHEST WITHOUT CONTRAST   TECHNIQUE: Multidetector CT imaging of the chest was performed following the standard protocol without IV contrast.   RADIATION DOSE REDUCTION: This exam was performed according to the departmental dose-optimization program which includes automated exposure control, adjustment of the mA and/or kV according to patient size and/or use of iterative reconstruction technique.   COMPARISON:  08/16/2021   FINDINGS: Cardiovascular: Dual lead pacer noted. Coronary, aortic arch, and branch vessel atherosclerotic vascular disease. Marked mitral valve calcification. Substantial aortic valve calcification. Prosthetic mitral and aortic valves. Mild cardiomegaly.   Ascending aortic aneurysm 4.2 cm in diameter on image 83 series 2, stable. There is also a prominent main pulmonary artery suggesting pulmonary arterial hypertension.   Mediastinum/Nodes: Lower right paratracheal lymph node 1.1 cm in short axis on image 64 series 2 (mildly enlarged), formerly 0.9 cm.   Lungs/Pleura: Trace right pleural effusion similar to prior. Right greater  than left apical pleuroparenchymal scarring. Similar appearance of confluent bandlike density anteriorly in the right upper lobe on image 55 series 3. Scattered tree-in-bud nodularity anteriorly in both upper lobes favoring atypical infectious bronchiolitis. New 5 by 4 mm right middle lobe nodule on image 99 series 3. New clustered bandlike nodularity inferiorly in the right middle lobe, image 118 series 3.   New bandlike densities and nodularity in both lower lobes along the diaphragms as shown on image 118 series 3. Most of this has a complex bandlike confluent morphology although there are some smaller areas of nodularity. A 7 by 5 mm right lower lobe nodule on image 121 of series 3 was previously 5 by 5 mm.   Continued bandlike scarring in the lingula. New branching nodularity in the superior segment left lower lobe.   Cylindrical bronchiectasis in the right lower lobe.  Upper Abdomen: Abdominal aortic and systemic atherosclerotic calcification. Clips along the gallbladder fossa suggest prior cholecystectomy.   Musculoskeletal: Right rib deformities from old healed rib fractures. Thoracic spondylosis with multilevel bridging spurring. Chronic anterior wedge compression fracture at L1 especially along the inferior endplate.   IMPRESSION: 1. Generally worsened scattered nodularity and bandlike opacities in the lungs superimposed on a background of scattered tree-in-bud reticulonodular opacities. 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 In this setting of scattered nodularity, high specificity with regard to malignant versus infectious etiology of any individual nodule is not feasible on noncontrast CT. Surveillance is suggested. 2. Marked atherosclerosis. 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 Mild cardiomegaly. 3. Ascending thoracic aortic aneurysm 4.2 cm in diameter,  stable. Recommend annual imaging followup by CTA or MRA. This recommendation follows 2010 ACCF/AHA/AATS/ACR/ASA/SCA/SCAI/SIR/STS/SVM Guidelines for the Diagnosis and Management of Patients with Thoracic Aortic Disease. Circulation. 2010; 121: Z610-R604. Aortic aneurysm NOS (ICD10-I71.9) 4. Stable trace right pleural effusion. 5. Prominent main pulmonary artery compatible pulmonary arterial hypertension. 6. Cylindrical bronchiectasis in the right lower lobe.     Electronically Signed   By: Gaylyn Rong [name]   On: 01/21/2022 08:41        OV 05/24/2022  Subjective:  Patient ID: [name], female , DOB: 09/11/1946 , age 78 y.o. , MRN: [id number] , ADDRESS: [address] [place] [address] PCP [name], MD Patient Care Team: [name], MD as PCP - General (Family Medicine) Thurmon Fair, MD as PCP - Cardiology (Cardiology) [name], MD as Consulting Physician (Gastroenterology) Dorothy Puffer, MD as Consulting Physician (Radiation Oncology) Laurice Record, MD as Consulting Physician (Hematology and Oncology) Susa Griffins, MD (Inactive) as Consulting Physician (Cardiology) [name], MD as Consulting Physician (Pulmonary Disease) [name], MD (General Surgery) Thurmon Fair, MD as Consulting Physician (Cardiology) Mckinley Jewel, MD as Consulting Physician (Ophthalmology) [place], Amy, MD as Consulting Physician (Dermatology) Drema Dallas, DO as Consulting Physician (Neurology)  This Provider for this visit: Treatment Team:  Attending Provider: [name], MD    05/24/2022 -   Chief Complaint  Patient presents with   Follow-up    Pt is here today to discuss results of recent CT.     HPI Tamlyn Tanah Drysdale 77 y.o. -waxing and waning pulmonary nodules she had another CT scan that shows the same.  Currently feels relatively stable.  Respiratory status is stable for her COPD.  She did see Dr.  Dorris Fetch May 2023 and he felt biopsy would be nondiagnostic and recommended serial expectant approach.  She did have a most recent CT scan as documented below and it shows continued waxing and waning nodules.  Incidentally she was found to have positive ANA when I tested her for etiology of the nodules.  With strongly positive at 1: 1280.  She has upcoming appointment Dr. Sheliah Hatch in rheumatology.  I have asked her to keep it.  Indicated to her the nodules could be related to the positive ANA but the positive ANA could also be post-COVID.  CLINICAL DATA:  Multiple lung nodules.   EXAM: CT CHEST WITHOUT CONTRAST   TECHNIQUE: Multidetector CT imaging of the chest was performed following the standard protocol without IV contrast.   RADIATION DOSE REDUCTION: This exam was performed according to the departmental dose-optimization program which includes automated exposure control, adjustment of the mA and/or kV according to patient size and/or use of iterative reconstruction technique.  COMPARISON:  CT 01/20/2022   FINDINGS: Cardiovascular: Coronary artery calcification and aortic atherosclerotic calcification. Dense aortic and mitral valvular calcification. Valvular prosthetics. No pericardial fluid. Midline sternotomy   Mediastinum/Nodes: No axillary or supraclavicular adenopathy. No mediastinal or hilar adenopathy. No pericardial fluid. Esophagus normal.   Lungs/Pleura: Branching tree-in-bud pattern in the medial RIGHT middle lobe and medial lingula unchanged. Nodule in the RIGHT upper lobe anterior to the fissure measuring 7 mm (68/series 3) is new from prior. Interval resolution of previous seen nodule in the apical segment of the LEFT lower lobe.   New nodule in the RIGHT lower lobe measuring 5 mm image 95/3). Resolution of peripheral nodule in the RIGHT lower lobe seen on comparison exam measuring 7 mm that time   Upper Abdomen: Limited view of the liver, kidneys,  pancreas are unremarkable. Normal adrenal glands.   Musculoskeletal: No aggressive osseous lesion.   IMPRESSION: 1. Waxing waning bilateral pulmonary nodules suggest chronic pulmonary infection/atypical infection. 2. Branching nodular pattern within the medial upper lobes also consistent with atypical pulmonary infection. 3. Dense valvular calcifications with valvular prosthetics.     Electronically Signed   By: Genevive Bi [name]   On: 05/13/2022 15:59    OV 12/02/2022  Subjective:  Patient ID: Ekaterini Quin Hoop, female , DOB: August 24, 1946 , age 77 y.o. , MRN: [id number] , ADDRESS: [address] [place] [address] PCP [name], MD Patient Care Team: [name], MD as PCP - General (Family Medicine) Thurmon Fair, MD as PCP - Cardiology (Cardiology) [name], MD as Consulting Physician (Gastroenterology) Dorothy Puffer, MD as Consulting Physician (Radiation Oncology) Laurice Record, MD as Consulting Physician (Hematology and Oncology) Susa Griffins, MD (Inactive) as Consulting Physician (Cardiology) [name], MD as Consulting Physician (Pulmonary Disease) [name], MD (General Surgery) Thurmon Fair, MD as Consulting Physician (Cardiology) Mckinley Jewel, MD as Consulting Physician (Ophthalmology) [place], Amy, MD as Consulting Physician (Dermatology) Drema Dallas, DO as Consulting Physician (Neurology)  This Provider for this visit: Treatment Team:  Attending Provider: [name], MD    12/02/2022 -   Chief Complaint  Patient presents with   Follow-up    Pt states she has been doing pretty well   HPI Allanna Marlisha Zidek 77 y.o. -returns for follow-up.  She has COPD.  She continues to be stable with her COPD.  She is on albuterol as needed along with DuoNeb and Singulair and Trelegy.  Respiratory wise she is stable.  August 2023 she did have CT scan of the chest that shows the lung nodules continue to  have a waxing and waning quality.  She prefers to have repeat CT scan because her father died for lung cancer and she is worried about this.  We took a shared decision making to have the neck CT scan in August 2024 which would be a 1 year follow-up.  She is agreeable to this.  She had a repeat COVID-19 in January 2024.  Was mild.  She took antiviral molnupiravir.  The main issue for her is that she is having severe chronic back pain.  She says she is needs to be sitting down or take tramadol.  Previous Pilates does not help.  She has seen Dr. Danielle Dess for this.  Also review of the external records indicate that in December 2023 she had hospitalization for memory issues.  Apparently lab work was normal.  I did review Dr. Renaee Munda admission     OV 06/05/2023  Subjective:  Patient ID:  Belanna Quin Hoop, female , DOB: [birth year] , age 62 y.o. , MRN: [id number] , ADDRESS: [address] [place] [address] PCP [name], MD Patient Care Team: [name], MD as PCP - General (Family Medicine) Thurmon Fair, MD as PCP - Cardiology (Cardiology) [name], MD as Consulting Physician (Gastroenterology) Dorothy Puffer, MD as Consulting Physician (Radiation Oncology) Laurice Record, MD as Consulting Physician (Hematology and Oncology) Susa Griffins, MD (Inactive) as Consulting Physician (Cardiology) [name], MD as Consulting Physician (Pulmonary Disease) [name], MD (General Surgery) Thurmon Fair, MD as Consulting Physician (Cardiology) Mckinley Jewel, MD as Consulting Physician (Ophthalmology) [place], Amy, MD as Consulting Physician (Dermatology) Drema Dallas, DO as Consulting Physician (Neurology)  This Provider for this visit: Treatment Team:  Attending Provider: [name], MD   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  Strongly positive ANA in spring 2023  -Reassured rheumatology consultation with Dr. Layne Benton 2023.  Significant chronic back pain  06/05/2023 -   Chief Complaint  Patient presents with   Follow-up    HRCT done 05/29/23.  She is doing well overall with her breathing and denies any new co's today. Would like to discuss RSV, Covid and Flu vax timing for this year.     HPI Shenna Angelice Bua 77 y.o. -returns for follow-up of above issues.  She says from a respiratory point she is stable.  Not much sputum not much cough not much shortness of breath.  However she is bothered by her frailty, chronic pain.  She uses a cane.  She is also guarded left shin ulcer that is not healing now.  It seems superficial.  There is also an emerging wound on the right shin.  She elevates her legs.  She says it is really hard to put her compression stockings.  She is going to talk to her primary care physician today.  I suggested that she talk to him about getting a vascular service referral.  Otherwise compliant with inhalers.  She did have a CT scan of the chest.  I personally visualized it and showed it to her.  According to radiology the nodules are somewhat worse.  Last bronchoscopy was 2022 and she had Pseudomonas.  She did have positive ANA strongly a year and a half ago.  But she was reassured by rheumatology then.  QuantiFERON gold was also negative a year and a half ago.      Simple office walk 185 feet x  3 laps goal with forehead probe 12/02/2022    O2 used ra   Number laps completed 2 of 3   Comments about pace slow   Resting Pulse Ox/HR 97% and 87/min   Final Pulse Ox/HR 95% and 97/min   Desaturated </= 88% no   Desaturated <= 3% points no   Got Tachycardic >/= 90/min yes   Symptoms at end of test bACK pain and stopped   Miscellaneous comments x      CT  Chest data from date: 05/29/23  - personally visualized and independently interpreted : yes - my findings are: as below   IMPRESSION: 1. 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 2. 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 3. Aortic atherosclerosis, in addition to left main and three-vessel coronary artery disease. Assessment for potential risk factor modification, dietary therapy or pharmacologic therapy may be warranted, if clinically indicated. 4. Trace bilateral pleural effusions, unchanged. 5. Additional incidental findings, as above.   Aortic Atherosclerosis (ICD10-I70.0).     Electronically Signed   By: Trudie Reed [name]   On: 06/01/2023 10:40 PFT     Latest Ref Rng & Units 03/13/2018   10:36 AM 04/01/2016   10:10 AM 11/29/2013    4:04 PM  ILD indicators  FVC-Pre L 2.21  2.22  1.49   FVC-Predicted Pre % 70  68  42   FVC-Post L  2.33  1.60   FVC-Predicted Post %  72  45   TLC L  5.00    TLC Predicted %  93    DLCO uncorrected ml/min/mmHg 24.55  19.21    DLCO UNC %Pred % 91  71    DLCO Corrected ml/min/mmHg 22.70     DLCO COR %Pred % 84         LAB RESULTS last 96 hours No results found.  LAB RESULTS last 90 days Recent Results (from the past 2160 hour(s))  CUP PACEART REMOTE DEVICE CHECK     Status: None   Collection Time: 04/28/23  4:00 AM  Result Value Ref Range   Date Time Interrogation Session [id number]    Pulse Generator Manufacturer SJCR    Pulse Gen Model 2272 Assurity MRI    Pulse Gen Serial Number [id number]    Clinic Name [facility]    Implantable Pulse Generator Type Implantable Pulse Generator    Implantable Pulse Generator Implant Date [id number]    Implantable Lead Manufacturer [facility]    Implantable Lead Model [id number] Tendril MRI     Implantable Lead Serial Number [id number]    Implantable Lead Implant Date [id number]    Implantable Lead Location Detail 1 APEX    Implantable Lead Location [id number]    Implantable Lead Connection Status [id number]    Implantable Lead Manufacturer SJCR    Implantable Lead Model [id number] Tendril MRI    Implantable Lead Serial Number [id number]    Implantable Lead Implant Date [id number]    Implantable Lead Location Detail 1 UNKNOWN    Implantable Lead Location [id number]    Implantable Lead Connection Status [id number]    Lead Channel Setting Sensing Sensitivity 2.0 mV   Lead Channel Setting Sensing Adaptation Mode Fixed Pacing    Lead Channel Setting Pacing Amplitude 2.0 V   Lead Channel Setting Pacing Pulse Width 0.5 ms   Lead Channel Setting Pacing Amplitude 0.875    Lead Channel Status NULL    Lead Channel Impedance Value 390 ohm   Lead Channel Sensing Intrinsic Amplitude 2.7 mV   Lead Channel Pacing Threshold Amplitude 0.75 V   Lead Channel Pacing Threshold Pulse Width 0.5 ms   Lead Channel Status NULL    Lead Channel Impedance Value 540 ohm   Lead Channel Sensing Intrinsic Amplitude 7.4 mV   Lead Channel Pacing Threshold Amplitude 0.625 V   Lead Channel Pacing Threshold Pulse Width 0.5 ms   Battery Status MOS    Battery Remaining Longevity 26 mo   Battery Remaining Percentage 25.0 %   Battery Voltage 2.87 V   Brady Statistic RA Percent Paced 73.0 %   Brady Statistic RV Percent Paced 99.0 %  Brady Statistic AP VP Percent 73.0 %   Brady Statistic AS VP Percent 27.0 %   Brady Statistic AP VS Percent 1.0 %   Brady Statistic AS VS Percent 1.0 %  CUP PACEART REMOTE DEVICE CHECK     Status: None   Collection Time: 04/28/23  5:48 AM  Result Value Ref Range   Date Time Interrogation Session [id number]    Pulse Generator Manufacturer SJCR    Pulse Gen Model 2272 Assurity MRI    Pulse Gen Serial Number [id number]    Clinic Name [facility]    Implantable Pulse Generator Type Implantable  Pulse Generator    Implantable Pulse Generator Implant Date [id number]    Implantable Lead Manufacturer [facility]    Implantable Lead Model [id number] Tendril MRI    Implantable Lead Serial Number [id number]    Implantable Lead Implant Date 65784696    Implantable Lead Location Detail 1 APEX    Implantable Lead Location [id number]    Implantable Lead Connection Status [id number]    Implantable Lead Manufacturer SJCR    Implantable Lead Model [id number] Tendril MRI    Implantable Lead Serial Number [id number]    Implantable Lead Implant Date [id number]    Implantable Lead Location Detail 1 UNKNOWN    Implantable Lead Location [id number]    Implantable Lead Connection Status [id number]    Lead Channel Setting Sensing Sensitivity 2.0 mV   Lead Channel Setting Sensing Adaptation Mode Fixed Pacing    Lead Channel Setting Pacing Amplitude 2.0 V   Lead Channel Setting Pacing Pulse Width 0.5 ms   Lead Channel Setting Pacing Amplitude 0.875    Lead Channel Status NULL    Lead Channel Impedance Value 390 ohm   Lead Channel Sensing Intrinsic Amplitude 2.7 mV   Lead Channel Pacing Threshold Amplitude 0.75 V   Lead Channel Pacing Threshold Pulse Width 0.5 ms   Lead Channel Status NULL    Lead Channel Impedance Value 540 ohm   Lead Channel Sensing Intrinsic Amplitude 7.4 mV   Lead Channel Pacing Threshold Amplitude 0.625 V   Lead Channel Pacing Threshold Pulse Width 0.5 ms   Battery Status MOS    Battery Remaining Longevity 25 mo   Battery Remaining Percentage 25.0 %   Battery Voltage 2.87 V   Brady Statistic RA Percent Paced 73.0 %   Brady Statistic RV Percent Paced 99.0 %   Brady Statistic AP VP Percent 73.0 %   Brady Statistic AS VP Percent 27.0 %   Brady Statistic AP VS Percent 1.0 %   Brady Statistic AS VS Percent 1.0 %  Urinalysis, Routine w reflex microscopic -Urine, Random     Status: Abnormal   Collection Time: 05/07/23  9:39 AM  Result Value Ref Range   Color, Urine RED (A) YELLOW   APPearance CLOUDY (A)  CLEAR   Specific Gravity, Urine  1.005 - 1.030    TEST NOT REPORTED DUE TO COLOR INTERFERENCE OF URINE PIGMENT   pH  5.0 - 8.0    TEST NOT REPORTED DUE TO COLOR INTERFERENCE OF URINE PIGMENT   Glucose, UA (A) NEGATIVE mg/dL    TEST NOT REPORTED DUE TO COLOR INTERFERENCE OF URINE PIGMENT   Hgb urine dipstick (A) NEGATIVE    TEST NOT REPORTED DUE TO COLOR INTERFERENCE OF URINE PIGMENT   Bilirubin Urine (A) NEGATIVE    TEST NOT REPORTED DUE TO COLOR INTERFERENCE OF URINE PIGMENT   Ketones, ur (A) NEGATIVE mg/dL    TEST NOT REPORTED DUE  TO COLOR INTERFERENCE OF URINE PIGMENT   Protein, ur (A) NEGATIVE mg/dL    TEST NOT REPORTED DUE TO COLOR INTERFERENCE OF URINE PIGMENT   Nitrite (A) NEGATIVE    TEST NOT REPORTED DUE TO COLOR INTERFERENCE OF URINE PIGMENT   Leukocytes,Ua (A) NEGATIVE    TEST NOT REPORTED DUE TO COLOR INTERFERENCE OF URINE PIGMENT   RBC / HPF >50 0 - 5 RBC/hpf   WBC, UA >50 0 - 5 WBC/hpf   Bacteria, UA NONE SEEN NONE SEEN   Squamous Epithelial / HPF >50 0 - 5 /HPF   WBC Clumps PRESENT    Hyaline Casts, UA PRESENT     Comment: Performed at [facility], [address], [place], [place] [address]  Urine Culture     Status: Abnormal   Collection Time: 05/07/23  9:39 AM   Specimen: Urine, Clean Catch  Result Value Ref Range   Specimen Description      URINE, CLEAN CATCH Performed at [facility], [address] [place], [place] [address]    Special Requests      NONE Performed at [facility], [address] [place], [place] [address]    Culture (A)     <10,000 COLONIES/mL INSIGNIFICANT GROWTH Performed at [facility], [address], [place], [place] [address]    Report Status 05/08/2023 FINAL   CBC with Differential     Status: Abnormal   Collection Time: 05/07/23  9:49 AM  Result Value Ref Range   WBC 5.4 4.0 - 10.5 K/uL   RBC 3.53 (L) 3.87 - 5.11 MIL/uL   Hemoglobin 11.5 (L) 12.0 - 15.0 g/dL   HCT 31.5 17.6 - 16.0 %    MCV 102.3 (H) 80.0 - 100.0 fL   MCH 32.6 26.0 - 34.0 pg   MCHC 31.9 30.0 - 36.0 g/dL   RDW 73.7 10.6 - 26.9 %   Platelets 76 (L) 150 - 400 K/uL    Comment: Immature Platelet Fraction may be clinically indicated, consider ordering this additional test [id number]    nRBC 0.0 0.0 - 0.2 %   Neutrophils Relative % 77 %   Neutro Abs 4.2 1.7 - 7.7 K/uL   Lymphocytes Relative 6 %   Lymphs Abs 0.3 (L) 0.7 - 4.0 K/uL   Monocytes Relative 12 %   Monocytes Absolute 0.6 0.1 - 1.0 K/uL   Eosinophils Relative 4 %   Eosinophils Absolute 0.2 0.0 - 0.5 K/uL   Basophils Relative 1 %   Basophils Absolute 0.0 0.0 - 0.1 K/uL   Immature Granulocytes 0 %   Abs Immature Granulocytes 0.01 0.00 - 0.07 K/uL    Comment: Performed at [facility], [address], [place], [place] [address]  Basic metabolic panel     Status: Abnormal   Collection Time: 05/07/23  9:49 AM  Result Value Ref Range   Sodium 140 135 - 145 mmol/L   Potassium 3.5 3.5 - 5.1 mmol/L   Chloride 102 98 - 111 mmol/L   CO2 29 22 - 32 mmol/L   Glucose, Bld 121 (H) 70 - 99 mg/dL    Comment: Glucose reference range applies only to samples taken after fasting for at least 8 hours.   BUN 15 8 - 23 mg/dL   Creatinine, Ser 3.81 (H) 0.44 - 1.00 mg/dL   Calcium 9.1 8.9 - 82.9 mg/dL   GFR, Estimated 55 (L) >60 mL/min    Comment: (NOTE) Calculated using the CKD-EPI  Creatinine Equation (2021)    Anion gap 9 5 - 15    Comment: Performed at [facility], [address] [place], [place] [address]  Protime-INR     Status: Abnormal   Collection Time: 05/07/23  9:49 AM  Result Value Ref Range   Prothrombin Time 18.9 (H) 11.4 - 15.2 seconds   INR 1.6 (H) 0.8 - 1.2    Comment: (NOTE) INR goal varies based on device and disease states. 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), Acute cystitis  without hematuria (12/22/2015), Acute right-sided low back pain without sciatica (11/26/2019), Adjustment disorder with depressed mood (10/15/2014), Anemia, Anticoagulant long-term use (06/14/2019), Aortic atherosclerosis (03/03/2022), Arthritis, Arthropathy of spinal facet joint (03/08/2018), Ascending aortic aneurysm (03/02/2021), Bilateral hip pain (09/08/2016), CAP (community acquired pneumonia) (02/08/2016), Carotid stenosis (12/08/2020), Cataracts, bilateral, Chest pain (08/25/2014), Chronic cholecystitis with calculus (01/21/2015), Chronic diastolic heart failure (09/19/2014), Chronic insomnia, Chronic lower back pain (03/05/2008), CKD (chronic kidney disease) stage 2, GFR 60-89 ml/min (07/03/2015), Constipation, COPD (chronic obstructive pulmonary disease), DDD (degenerative disc disease), DISH (diffuse idiopathic skeletal hyperostosis) (05/14/2019), Dyspnea on exertion (06/05/2015), Essential hypertension, benign (03/24/2009), GERD (gastroesophageal reflux disease), Hematuria (06/17/2021), History of radiation therapy (05/30/11 to 07/07/11), History of rectal cancer (05/26/2011), History of shingles, History of vertebral fracture (11/26/2019), HLD (hyperlipidemia) (05/03/2007), Hyperlipemia, Lacunar infarction, Left hip pain (07/29/2020), Legally blind in left eye, Lung nodule, 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), Macrocytosis (03/27/2022), Malaise and fatigue (01/22/2018), Meralgia paresthetica (12/13/2007), 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), Obesity, Osteopenia (12/2014), Other spondylosis with radiculopathy, lumbar region (07/17/2018), Pain of right sacroiliac joint (08/09/2017), Pancreatitis (11/2014), Peripheral neuropathy (01/03/2022), Persistent atrial fibrillation (12/09/2020), Personal history of colonic polyps, PONV (postoperative nausea and vomiting), Presence of permanent cardiac pacemaker, 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), 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),  Pulmonary artery hypertension (06/17/2021), Pulmonary infiltrates (04/23/2019), Pulmonary nodule (02/23/2012), Rheumatic heart disease mitral stenosis, Right leg swelling (06/11/2020), 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   reports that she quit smoking about 48 years ago. Her smoking use included cigarettes. She started smoking about 58 years ago. She has a 15 pack-year smoking history. She has never used smokeless tobacco.  Past Surgical History:  Procedure Laterality Date   ANTERIOR LAT LUMBAR FUSION Left 07/17/2018   Procedure: Lumbar Two-Three Lumbar Three-Four Anterolateral decompression/interbody fusion with lateral plate fixation/Infuse;  Surgeon: Barnett Abu, MD;  Location: [facility] OR;  Service: Neurosurgery;  Laterality: Left;  Lumbar Two-Three Lumbar Three-Four Anterolateral decompression/interbody fusion with lateral plate fixation/Infuse   AORTIC VALVE REPLACEMENT N/A 12/12/2013   Procedure: AORTIC VALVE REPLACEMENT (AVR);  Surgeon: Alleen Borne, MD; Service: Open Heart Surgery   BACK SURGERY  2006, 2007   2006 SPACER, 2007 decompression and fusion L4/5   BOWEL RESECTION  04/02/2012   Procedure: SMALL BOWEL RESECTION;  Surgeon: [name], MD;  Location: [facility] ORS;  Service: General;  Laterality: N/A;   BRONCHIAL WASHINGS  03/24/2021   Procedure: BRONCHIAL WASHINGS;  Surgeon: [name], MD;  Location: [facility] ENDOSCOPY;  Service: Endoscopy;;  CARPAL TUNNEL RELEASE Bilateral    CATARACT EXTRACTION W/  INTRAOCULAR LENS IMPLANT Left 10/18/2016   CATARACT EXTRACTION W/ INTRAOCULAR LENS IMPLANT Right 12/13/2016   Dr. Dagoberto Ligas   CHOLECYSTECTOMY N/A 01/21/2015   chronic cholecystitis, [name], MD   COLON RESECTION  08/25/2011   Procedure: COLON RESECTION LAPAROSCOPIC;  Surgeon: [name], MD;  Location: [facility] ORS;  Service: General;  Laterality: N/A;  Laparoscopic Assisted Low Anterior Resection Diverting Ostomy and onQ pain pump   COLONOSCOPY  03/2013   1 polyp, rpt 3 yrs Christella Hartigan)   COLONOSCOPY  05/2021   diverticulosis and patent colon anastomosis with some edema/narrowing, rpt 5 yrs Christella Hartigan)   COLONOSCOPY WITH PROPOFOL N/A 06/09/2016   patent colo-colonic anastomosis, rpt 5 yrs Christella Hartigan)   EP IMPLANTABLE DEVICE N/A 06/16/2016   Procedure: Pacemaker Implant;  Surgeon: Will Jorja Loa, MD;  Location: [facility] INVASIVE CV LAB;  Service: Cardiovascular;  Laterality: N/A;   FOOT SURGERY  left foot   hammer toe   ILEOSTOMY  08/25/2011   ILEOSTOMY CLOSURE  04/02/2012   Procedure: ILEOSTOMY TAKEDOWN;  Surgeon: [name], MD;  Location: [facility]S;  Service: General;  Laterality: N/A;   INTRAOPERATIVE TRANSESOPHAGEAL ECHOCARDIOGRAM N/A 12/12/2013   Procedure: INTRAOPERATIVE TRANSESOPHAGEAL ECHOCARDIOGRAM;  Surgeon: Alleen Borne, MD;  Location: [facility] OR;  Service: Open Heart Surgery;  Laterality: N/A;   LEFT AND RIGHT HEART CATHETERIZATION WITH CORONARY ANGIOGRAM N/A 11/27/2013   Procedure: LEFT AND RIGHT HEART CATHETERIZATION WITH CORONARY ANGIOGRAM;  Surgeon: Thurmon Fair, MD;  Location: [facility] CATH LAB;  Service: Cardiovascular;  Laterality: N/A;   MITRAL VALVE REPLACEMENT N/A 12/12/2013   Procedure: MITRAL VALVE (MV) REPLACEMENT OR REPAIR;  Surgeon: Alleen Borne, MD; Service: Open Heart Surgery   TEE WITHOUT CARDIOVERSION N/A 11/29/2013   Procedure: TRANSESOPHAGEAL ECHOCARDIOGRAM (TEE);  Surgeon: Quintella Reichert, MD;  Location: [facility] ENDOSCOPY;  Service: Cardiovascular;  Laterality: N/A;   TEE  WITHOUT CARDIOVERSION N/A 06/04/2019   Procedure: TRANSESOPHAGEAL ECHOCARDIOGRAM (TEE);  Surgeon: Thurmon Fair, MD;  Location: [facility] ENDOSCOPY;  Service: Cardiovascular;  Laterality: N/A;   TONSILLECTOMY     VIDEO BRONCHOSCOPY N/A 03/24/2021   Procedure: VIDEO BRONCHOSCOPY WITHOUT FLUORO;  Surgeon: [name], MD;  Location: [facility] ENDOS[facility];  Service: Endoscopy;  Laterality: N/A;  SMALL SCOPE,PREMEDICATE WITH NEBULIZED LIDOCAINE    Allergies  Allergen Reactions   Codeine Nausea Only   Sulfa Antibiotics Nausea Only   Sulfonamide Derivatives Nausea Only    Immunization History  Administered Date(s) Administered   COVID-19, mRNA, vaccine(Comirnaty)12 years and older 08/15/2022   Fluad Quad(high Dose 65+) 07/11/2019, 07/14/2020, 07/25/2022   H1N1 09/17/2008   Influenza Split 07/18/2012   Influenza Whole 08/10/2005, 06/24/2009, 07/11/2011   Influenza, High Dose Seasonal PF 07/04/2016, 07/04/2017, 07/27/2021   Influenza,inj,Quad PF,6+ Mos 07/09/2018   Influenza-Unspecified 07/24/2013, 06/30/2014, 06/30/2015   Moderna Covid-19 Vaccine Bivalent Booster 15yrs & up 07/02/2021   Moderna SARS-COV2 Booster Vaccination 08/14/2020   Moderna Sars-Covid-2 Vaccination 02/26/2021   PFIZER(Purple Top)SARS-COV-2 Vaccination 10/30/2019, 11/20/2019   Pneumococcal Conjugate-13 06/02/2014   Pneumococcal Polysaccharide-23 08/10/2005, 04/21/2008, 06/25/2013   Respiratory Syncytial Virus Vaccine,Recomb Aduvanted(Arexvy) 07/13/2022   Td 10/11/2005   Zoster Recombinant(Shingrix) 06/25/2018, 08/28/2018   Zoster, Live 09/16/2014    Family History  Problem Relation Age of Onset   Heart disease Mother    Diabetes Mother    Hypertension Mother    Stroke Mother    Lung cancer Father    Breast cancer Maternal Aunt 96   Heart attack Maternal Grandfather  CAD Son 1   Heart attack Son 39   Rectal cancer Neg Hx    Stomach cancer Neg Hx    Esophageal cancer Neg Hx    Colon cancer Neg Hx       Current Outpatient Medications:    acetaminophen (TYLENOL) 500 MG tablet, Take 2 tablets (1,000 mg total) by mouth daily., Disp: , Rfl:    ascorbic acid (VITAMIN C) 500 MG tablet, Take 1 tablet (500 mg total) by mouth daily., Disp: , Rfl:    Biotin 1000 MCG tablet, Take 1,000 mcg by mouth daily., Disp: , Rfl:    buPROPion (WELLBUTRIN SR) 150 MG 12 hr tablet, Take 1 tablet (150 mg total) by mouth every morning., Disp: 90 tablet, Rfl: 1   calcium carbonate (OSCAL) 1500 (600 Ca) MG TABS tablet, Take 600 mg of elemental calcium by mouth daily., Disp: , Rfl:    carboxymethylcellulose (REFRESH PLUS) 0.5 % SOLN, Place 1 drop into both eyes 3 (three) times daily as needed (dry eyes)., Disp: , Rfl:    Cholecalciferol (VITAMIN D) 50 MCG (2000 UT) tablet, Take 2,000 Units by mouth daily., Disp: , Rfl:    docusate sodium (COLACE) 100 MG capsule, Take 200 mg by mouth every evening., Disp: , Rfl:    ELIQUIS 5 MG TABS tablet, TAKE 1 TABLET(5 MG) BY MOUTH TWICE DAILY, Disp: 180 tablet, Rfl: 1   furosemide (LASIX) 40 MG tablet, Take 1 tablet (40 mg total) by mouth daily. May take an extra tablet daily as needed for swelling., Disp: 135 tablet, Rfl: 2   gabapentin (NEURONTIN) 100 MG capsule, Take 1 capsule (100 mg total) by mouth 2 (two) times daily as needed (back pain - for daytime use)., Disp: 60 capsule, Rfl: 1   gabapentin (NEURONTIN) 300 MG capsule, Take 300 mg by mouth at bedtime., Disp: , Rfl:    hydrocortisone (ANUSOL-HC) 2.5 % rectal cream, Place 1 Application rectally 2 (two) times daily., Disp: 30 g, Rfl: 0   ipratropium-albuterol (DUONEB) 0.5-2.5 (3) MG/3ML SOLN, Take 3 mLs by nebulization every 6 (six) hours as needed., Disp: 360 mL, Rfl: 0   lovastatin (MEVACOR) 40 MG tablet, TAKE 1 TABLET(40 MG) BY MOUTH AT BEDTIME, Disp: 90 tablet, Rfl: 1   montelukast (SINGULAIR) 10 MG tablet, TAKE 1 TABLET(10 MG) BY MOUTH AT BEDTIME, Disp: 90 tablet, Rfl: 1   Multiple Vitamins-Minerals (PRESERVISION AREDS 2  PO), Take 2 capsules by mouth every evening. , Disp: , Rfl:    polyethylene glycol (MIRALAX / GLYCOLAX) packet, Take 17 g by mouth daily., Disp: 14 each, Rfl: 0   potassium chloride (KLOR-CON) 10 MEQ tablet, TAKE 2 TABLETS(20 MEQ) BY MOUTH DAILY, Disp: 180 tablet, Rfl: 1   tamsulosin (FLOMAX) 0.4 MG CAPS capsule, Take 0.4 mg by mouth daily after supper., Disp: , Rfl:    traMADol (ULTRAM) 50 MG tablet, TAKE 1 TABLET(50 MG) BY MOUTH TWICE DAILY AS NEEDED, Disp: 60 tablet, Rfl: 3   traZODone (DESYREL) 50 MG tablet, TAKE 1/2 TO 1 TABLET(25 TO 50 MG) BY MOUTH AT BEDTIME, Disp: 30 tablet, Rfl: 6   Vibegron (GEMTESA) 75 MG TABS, Take 75 mg by mouth daily., Disp: , Rfl:    vitamin B-12 (CYANOCOBALAMIN) 500 MCG tablet, Take 1 tablet (500 mcg total) by mouth 2 (two) times a week., Disp: , Rfl:    Zinc 50 MG CAPS, Take 1 capsule (50 mg total) by mouth daily., Disp: , Rfl: 0   albuterol (VENTOLIN HFA) 108 (90 Base) MCG/ACT  inhaler, Inhale 2 puffs into the lungs every 6 (six) hours as needed for wheezing., Disp: 18 g, Rfl: 6   Fluticasone-Umeclidin-Vilant (TRELEGY ELLIPTA) 200-62.5-25 MCG/ACT AEPB, Inhale 1 puff into the lungs daily., Disp: 60 each, Rfl: 11 No current facility-administered medications for this visit.  Facility-Administered Medications Ordered in Other Visits:    ondansetron (ZOFRAN) 4 mg in sodium chloride 0.9 % 50 mL IVPB, 4 mg, Intravenous, Q6H PRN, Barnett Abu, MD      Objective:   Vitals:   06/05/23 0919  BP: 126/74  Pulse: 74  SpO2: 95%  Weight: 150 lb 12.8 oz (68.4 kg)  Height: 5\' 4"  (1.626 m)    Estimated body mass index is 25.88 kg/m as calculated from the following:   Height as of this encounter: 5\' 4"  (1.626 m).   Weight as of this encounter: 150 lb 12.8 oz (68.4 kg).  @WEIGHTCHANGE @  [employer]   06/05/23 0919  Weight: 150 lb 12.8 oz (68.4 kg)     Physical Exam   General: No distress. More deconditioned O2 at rest: no Cane present: YES Sitting in  wheel chair: no Frail: YES Obese: no Neuro: Alert and Oriented x 3. GCS 15. Speech normal Psych: Pleasant Resp:  Barrel Chest - yes.  Wheeze - no, Crackles - mild at base, No overt respiratory distress CVS: Normal heart sounds. Murmurs - nno Ext: Stigmata of Connective Tissue Disease - no HEENT: Normal upper airway. PEERL +. No post nasal drip        Assessment:       ICD-10-CM   1. Moderate COPD (chronic obstructive pulmonary disease) (HCC)  J44.9     2. Multiple lung nodules on CT  R91.8     3. ANA positive  R76.8     4. Frailty  R54     5. 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    L97.909          Plan:     Patient Instructions     ICD-10-CM   1. Moderate COPD (chronic obstructive pulmonary disease) (HCC)  J44.9     2. Multiple lung nodules on CT  R91.8     3. ANA positive  R76.8     4. Frailty  R54     5. 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    L97.909       Copd itself is stable but nodules per radiology are worse aug 2024 compared to August 2023 2 years ago bronch did not show MAC infections Nodules do not appear consistent with lung cancer  You are also being hampered by frailty, chronic pain and chronic venous stasis ulcer/dermatitis esp left shin  Plan  - check ANA, RF, CCP and DS-DNA again  - check ESR, Quantifern gold - check spiometry and dlco in few months  - consider repeat bronch versus empiric azithromycin versus continued observation based on above  - tak to PCP [name], MD about vascular service referral for leg ulcer - continue your medications otherwise   Followup - 2-3 months or sooner if needed   FOLLOWUP Return in about 8 weeks (around 07/31/2023) for 15 min visit, copd, after Cleda Daub and DLCO.    SIGNATURE    Dr. [name], [name], F.C.C.P,  Pulmonary and Critical Care Medicine Staff Physician, [facility] Director - Interstitial Lung Disease  Program   Pulmonary Fibrosis [facility] [place], [place], [address]  Pager:  [phone], If no answer or between  15:00h - 7:00h: call [phone] Telephone: [phone]  10:01 AM 06/05/2023

## 2023-06-05 NOTE — Progress Notes (Unsigned)
Ph: [phone] Fax: [phone]   Patient ID: [name], female    DOB: 1946/07/03, 77 y.o.   MRN: [id number]  This visit was conducted in person.  BP 108/64   Pulse 87   Temp 97.8 F (36.6 C)   Wt 151 lb (68.5 kg)   SpO2 95%   BMI 25.92 kg/m   BP Readings from Last 3 Encounters:  06/05/23 108/64  06/05/23 126/74  05/07/23 136/77   CC: check spots on neck Subjective:   HPI: [name] is a 77 y.o. female presenting on 06/05/2023 for Leg Pain   Several weeks ago noted lumps on the skin to anterior chest around around prior scar.  Not tender or itchy.  No redness to skin No recent bug bites.  She notes skin is more dry.   Chronic leg swelling associated with dry skin. L anterior shin is getting painful red and swollen. Heels can be very painful at night.   She continues eliquis 5mg  BID.  Recently found to have kidney stones bilaterally - seeing urology for this. Overall asxs.   Saw pulm today Ramaswamy - new pulm nodules planned labs and PFTs scheduled 08/2023.      Relevant past medical, surgical, family and social history reviewed and updated as indicated. Interim medical history since our last visit reviewed. Allergies and medications reviewed and updated. Outpatient Medications Prior to Visit  Medication Sig Dispense Refill   acetaminophen (TYLENOL) 500 MG tablet Take 2 tablets (1,000 mg total) by mouth daily.     albuterol (VENTOLIN HFA) 108 (90 Base) MCG/ACT inhaler Inhale 2 puffs into the lungs every 6 (six) hours as needed for wheezing. 18 g 6   ascorbic acid (VITAMIN C) 500 MG tablet Take 1 tablet (500 mg total) by mouth daily.     Biotin 1000 MCG tablet Take 1,000 mcg by mouth daily.     buPROPion (WELLBUTRIN SR) 150 MG 12 hr tablet Take 1 tablet (150 mg total) by mouth every morning. 90 tablet 1   calcium carbonate (OSCAL) 1500 (600 Ca) MG TABS tablet Take 600 mg of elemental calcium by mouth daily.     carboxymethylcellulose  (REFRESH PLUS) 0.5 % SOLN Place 1 drop into both eyes 3 (three) times daily as needed (dry eyes).     Cholecalciferol (VITAMIN D) 50 MCG (2000 UT) tablet Take 2,000 Units by mouth daily.     docusate sodium (COLACE) 100 MG capsule Take 200 mg by mouth every evening.     ELIQUIS 5 MG TABS tablet TAKE 1 TABLET(5 MG) BY MOUTH TWICE DAILY 180 tablet 1   Fluticasone-Umeclidin-Vilant (TRELEGY ELLIPTA) 200-62.5-25 MCG/ACT AEPB Inhale 1 puff into the lungs daily. 60 each 11   furosemide (LASIX) 40 MG tablet Take 1 tablet (40 mg total) by mouth daily. May take an extra tablet daily as needed for swelling. 135 tablet 2   gabapentin (NEURONTIN) 100 MG capsule Take 1 capsule (100 mg total) by mouth 2 (two) times daily as needed (back pain - for daytime use). 60 capsule 1   gabapentin (NEURONTIN) 300 MG capsule Take 300 mg by mouth at bedtime.     hydrocortisone (ANUSOL-HC) 2.5 % rectal cream Place 1 Application rectally 2 (two) times daily. 30 g 0   ipratropium-albuterol (DUONEB) 0.5-2.5 (3) MG/3ML SOLN Take 3 mLs by nebulization every 6 (six) hours as needed. 360 mL 0   lovastatin (MEVACOR) 40 MG tablet TAKE 1 TABLET(40 MG) BY MOUTH AT BEDTIME 90 tablet  1   montelukast (SINGULAIR) 10 MG tablet TAKE 1 TABLET(10 MG) BY MOUTH AT BEDTIME 90 tablet 1   Multiple Vitamins-Minerals (PRESERVISION AREDS 2 PO) Take 2 capsules by mouth every evening.      polyethylene glycol (MIRALAX / GLYCOLAX) packet Take 17 g by mouth daily. 14 each 0   potassium chloride (KLOR-CON) 10 MEQ tablet TAKE 2 TABLETS(20 MEQ) BY MOUTH DAILY 180 tablet 1   tamsulosin (FLOMAX) 0.4 MG CAPS capsule Take 0.4 mg by mouth daily after supper.     traMADol (ULTRAM) 50 MG tablet TAKE 1 TABLET(50 MG) BY MOUTH TWICE DAILY AS NEEDED 60 tablet 3   traZODone (DESYREL) 50 MG tablet TAKE 1/2 TO 1 TABLET(25 TO 50 MG) BY MOUTH AT BEDTIME 30 tablet 6   Vibegron (GEMTESA) 75 MG TABS Take 75 mg by mouth daily.     vitamin B-12 (CYANOCOBALAMIN) 500 MCG tablet  Take 1 tablet (500 mcg total) by mouth 2 (two) times a week.     Zinc 50 MG CAPS Take 1 capsule (50 mg total) by mouth daily.  0   Facility-Administered Medications Prior to Visit  Medication Dose Route Frequency Provider Last Rate Last Admin   ondansetron (ZOFRAN) 4 mg in sodium chloride 0.9 % 50 mL IVPB  4 mg Intravenous Q6H PRN Barnett Abu, MD         Per HPI unless specifically indicated in ROS section below Review of Systems  Objective:  BP 108/64   Pulse 87   Temp 97.8 F (36.6 C)   Wt 151 lb (68.5 kg)   SpO2 95%   BMI 25.92 kg/m   Wt Readings from Last 3 Encounters:  06/05/23 151 lb (68.5 kg)  06/05/23 150 lb 12.8 oz (68.4 kg)  05/07/23 145 lb 12.8 oz (66.1 kg)      Physical Exam    Results for orders placed or performed in visit on 06/05/23  Sed Rate (ESR)  Result Value Ref Range   Sed Rate 55 (H) 0 - 30 mm/hr   *Note: Due to a large number of results and/or encounters for the requested time period, some results have not been displayed. A complete set of results can be found in Results Review.   Lab Results  Component Value Date   NA 140 05/07/2023   CL 102 05/07/2023   K 3.5 05/07/2023   CO2 29 05/07/2023   BUN 15 05/07/2023   CREATININE 1.04 (H) 05/07/2023   GFRNONAA 55 (L) 05/07/2023   CALCIUM 9.1 05/07/2023   PHOS 3.5 11/15/2017   ALBUMIN 3.7 06/22/2022   GLUCOSE 121 (H) 05/07/2023    Assessment & Plan:   Problem List Items Addressed This Visit   None    No orders of the defined types were placed in this encounter.   No orders of the defined types were placed in this encounter.   There are no Patient Instructions on file for this visit.  Follow up plan: No follow-ups on file.  [name], MD

## 2023-06-05 NOTE — Addendum Note (Signed)
Addended by: Christen Butter on: 06/05/2023 10:06 AM   Modules accepted: Orders

## 2023-06-05 NOTE — Patient Instructions (Addendum)
ICD-10-CM   1. Moderate COPD (chronic obstructive pulmonary disease) (HCC)  J44.9     2. Multiple lung nodules on CT  R91.8     3. ANA positive  R76.8     4. Frailty  R54     5. 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    L97.909       Copd itself is stable but nodules per radiology are worse aug 2024 compared to August 2023 2 years ago bronch did not show MAC infections Nodules do not appear consistent with lung cancer  You are also being hampered by frailty, chronic pain and chronic venous stasis ulcer/dermatitis esp left shin  Plan  - check ANA, RF, CCP and DS-DNA again  - check ESR, Quantifern gold - check spiometry and dlco in few months  - consider repeat bronch versus empiric azithromycin versus continued observation based on above  - tak to PCP [name], MD about vascular service referral for leg ulcer - continue your medications otherwise   Followup - 2-3 months or sooner if needed

## 2023-06-06 DIAGNOSIS — R229 Localized swelling, mass and lump, unspecified: Secondary | ICD-10-CM | POA: Insufficient documentation

## 2023-06-06 DIAGNOSIS — L97909 Non-pressure chronic ulcer of unspecified part of unspecified lower leg with unspecified severity: Secondary | ICD-10-CM | POA: Insufficient documentation

## 2023-06-06 NOTE — Assessment & Plan Note (Signed)
Poorly healing wound to left anterior shin.  Anticipate CVI and likely arterial insufficiency contributing to poor healing. R/o skin infection - wound culture collected, will also start keflex preventatively. See below.

## 2023-06-06 NOTE — Assessment & Plan Note (Signed)
Concern for poorly healing scabbed arterial leg ulcer to left lateral malleolus.  Diminished pulses on left compare to right. Will send for ABIs.

## 2023-06-06 NOTE — Assessment & Plan Note (Signed)
Reassured - most consistent with sternotomy suture wires. To let me know if enlarging, changing.

## 2023-06-06 NOTE — Assessment & Plan Note (Signed)
Ongoing unsteadiness.  Did not go to outpatient PT last referred 03/2023.  Given at risk of falls, will start with home health evaluation.  Referral placed.

## 2023-06-08 ENCOUNTER — Encounter: Payer: Self-pay | Admitting: Family Medicine

## 2023-06-08 ENCOUNTER — Other Ambulatory Visit: Payer: Self-pay | Admitting: Family Medicine

## 2023-06-08 LAB — WOUND CULTURE
MICRO NUMBER:: 15381282
SPECIMEN QUALITY:: ADEQUATE

## 2023-06-08 LAB — ANTI-NUCLEAR AB-TITER (ANA TITER)
ANA TITER: 1:160 {titer} — ABNORMAL HIGH
ANA Titer 1: 1:40 {titer} — ABNORMAL HIGH

## 2023-06-08 LAB — QUANTIFERON-TB GOLD PLUS
Mitogen-NIL: 1.78 [IU]/mL
NIL: 0.02 [IU]/mL
QuantiFERON-TB Gold Plus: NEGATIVE
TB1-NIL: 0 [IU]/mL
TB2-NIL: 0 [IU]/mL

## 2023-06-08 LAB — ANTI-DNA ANTIBODY, DOUBLE-STRANDED: ds DNA Ab: 2 [IU]/mL

## 2023-06-08 LAB — RHEUMATOID FACTOR: Rheumatoid fact SerPl-aCnc: 18 [IU]/mL — ABNORMAL HIGH (ref <14)

## 2023-06-08 LAB — CYCLIC CITRUL PEPTIDE ANTIBODY, IGG: Cyclic Citrullin Peptide Ab: <16 UNITS

## 2023-06-08 LAB — ANA: Anti Nuclear Antibody (ANA): POSITIVE — AB

## 2023-06-08 MED ORDER — CIPROFLOXACIN HCL 500 MG PO TABS: 500.0000 mg | ORAL_TABLET | Freq: Two times a day (BID) | ORAL | 0 refills | Status: AC

## 2023-06-12 ENCOUNTER — Encounter: Payer: Self-pay | Admitting: Family Medicine

## 2023-06-14 NOTE — Telephone Encounter (Signed)
Plz schedule wound check for Thursday 12 or 12:30

## 2023-06-15 ENCOUNTER — Encounter: Payer: Self-pay | Admitting: Family Medicine

## 2023-06-15 ENCOUNTER — Ambulatory Visit (INDEPENDENT_AMBULATORY_CARE_PROVIDER_SITE_OTHER): Payer: Medicare Other | Admitting: Family Medicine

## 2023-06-15 VITALS — BP 118/64 | HR 99 | Temp 98.0°F | Ht 64.0 in | Wt 150.1 lb

## 2023-06-15 DIAGNOSIS — L97929 Non-pressure chronic ulcer of unspecified part of left lower leg with unspecified severity: Secondary | ICD-10-CM

## 2023-06-15 DIAGNOSIS — I83029 Varicose veins of left lower extremity with ulcer of unspecified site: Secondary | ICD-10-CM | POA: Diagnosis not present

## 2023-06-15 DIAGNOSIS — L97919 Non-pressure chronic ulcer of unspecified part of right lower leg with unspecified severity: Secondary | ICD-10-CM

## 2023-06-15 DIAGNOSIS — L97909 Non-pressure chronic ulcer of unspecified part of unspecified lower leg with unspecified severity: Secondary | ICD-10-CM

## 2023-06-15 DIAGNOSIS — L89600 Pressure ulcer of unspecified heel, unstageable: Secondary | ICD-10-CM | POA: Diagnosis not present

## 2023-06-15 DIAGNOSIS — I83019 Varicose veins of right lower extremity with ulcer of unspecified site: Secondary | ICD-10-CM | POA: Diagnosis not present

## 2023-06-15 DIAGNOSIS — L89609 Pressure ulcer of unspecified heel, unspecified stage: Secondary | ICD-10-CM | POA: Insufficient documentation

## 2023-06-15 NOTE — Assessment & Plan Note (Signed)
Persistent poorly healing scab to left lateral malleolus, pending ABIs ordered last week.

## 2023-06-15 NOTE — Telephone Encounter (Signed)
Noted. Added pt at 12:00 today.

## 2023-06-15 NOTE — Assessment & Plan Note (Signed)
Wound culture grew pseudomonas.  This is improving after cipro 500mg  BID 5d course however she had significant nausea while on this medicine.  No further abx needed.  Home care reviewed.

## 2023-06-15 NOTE — Patient Instructions (Signed)
Leg wound continues healing well. Ok to just use small amt of antibiotic ointment and small bandage to residual open area.  I will wait to see if home health wound care can come out to house to set you up with off loading foam dressings for heels and ankle wounds.

## 2023-06-15 NOTE — Assessment & Plan Note (Signed)
Bilateral heel pressure sores, chronic.  Discussed offloading as much as able.  Will await HH wound care eval - would recommend foam pressure sore dressing.

## 2023-06-15 NOTE — Progress Notes (Signed)
Ph: [phone] Fax: [phone]   Patient ID: [name], female    DOB: 03-08-1946, 77 y.o.   MRN: [id number]  This visit was conducted in person.  BP 118/64   Pulse 99   Temp 98 F (36.7 C) (Temporal)   Ht 5\' 4"  (1.626 m)   Wt 150 lb 2 oz (68.1 kg)   SpO2 96%   BMI 25.77 kg/m    CC: check leg wound Subjective:   HPI: [name] is a 77 y.o. female presenting on 06/15/2023 for Wound Check (Here for L leg wound chk. Also wants area on R lower checked. )   See prior note for details. Referred to [facility] 05/12/2023 for wound care and physical therapy - 2 different companies were unable to accept, pending [place]  L anterior shin ulcer - wound culture grew pseudomonas, treated with ciprofloxacin 500mg  BID x5d course. She was very nauseated while on medicine. Wound has continued healing well.   Persistent chronic scab to left lateral malleolus as well as bilateral heels  ABI order pending.      Relevant past medical, surgical, family and social history reviewed and updated as indicated. Interim medical history since our last visit reviewed. Allergies and medications reviewed and updated. Outpatient Medications Prior to Visit  Medication Sig Dispense Refill   acetaminophen (TYLENOL) 500 MG tablet Take 2 tablets (1,000 mg total) by mouth daily.     albuterol (VENTOLIN HFA) 108 (90 Base) MCG/ACT inhaler Inhale 2 puffs into the lungs every 6 (six) hours as needed for wheezing. 18 g 6   ascorbic acid (VITAMIN C) 500 MG tablet Take 1 tablet (500 mg total) by mouth daily.     Biotin 1000 MCG tablet Take 1,000 mcg by mouth daily.     buPROPion (WELLBUTRIN SR) 150 MG 12 hr tablet Take 1 tablet (150 mg total) by mouth every morning. 90 tablet 1   calcium carbonate (OSCAL) 1500 (600 Ca) MG TABS tablet Take 600 mg of elemental calcium by mouth daily.     carboxymethylcellulose (REFRESH PLUS) 0.5 % SOLN Place 1 drop into both eyes 3 (three) times daily as needed (dry  eyes).     Cholecalciferol (VITAMIN D) 50 MCG (2000 UT) tablet Take 2,000 Units by mouth daily.     docusate sodium (COLACE) 100 MG capsule Take 200 mg by mouth every evening.     ELIQUIS 5 MG TABS tablet TAKE 1 TABLET(5 MG) BY MOUTH TWICE DAILY 180 tablet 1   Fluticasone-Umeclidin-Vilant (TRELEGY ELLIPTA) 200-62.5-25 MCG/ACT AEPB Inhale 1 puff into the lungs daily. 60 each 11   furosemide (LASIX) 40 MG tablet Take 1 tablet (40 mg total) by mouth daily. May take an extra tablet daily as needed for swelling. 135 tablet 2   gabapentin (NEURONTIN) 100 MG capsule Take 1 capsule (100 mg total) by mouth 2 (two) times daily as needed (back pain - for daytime use). 60 capsule 1   gabapentin (NEURONTIN) 300 MG capsule Take 300 mg by mouth at bedtime.     hydrocortisone (ANUSOL-HC) 2.5 % rectal cream Place 1 Application rectally 2 (two) times daily. 30 g 0   ipratropium-albuterol (DUONEB) 0.5-2.5 (3) MG/3ML SOLN Take 3 mLs by nebulization every 6 (six) hours as needed. 360 mL 0   lovastatin (MEVACOR) 40 MG tablet TAKE 1 TABLET(40 MG) BY MOUTH AT BEDTIME 90 tablet 1   montelukast (SINGULAIR) 10 MG tablet TAKE 1 TABLET(10 MG) BY MOUTH AT BEDTIME 90 tablet 1  Multiple Vitamins-Minerals (PRESERVISION AREDS 2 PO) Take 2 capsules by mouth every evening.      polyethylene glycol (MIRALAX / GLYCOLAX) packet Take 17 g by mouth daily. 14 each 0   potassium chloride (KLOR-CON) 10 MEQ tablet TAKE 2 TABLETS(20 MEQ) BY MOUTH DAILY 180 tablet 1   tamsulosin (FLOMAX) 0.4 MG CAPS capsule Take 0.4 mg by mouth daily after supper.     traMADol (ULTRAM) 50 MG tablet TAKE 1 TABLET(50 MG) BY MOUTH TWICE DAILY AS NEEDED 60 tablet 3   traZODone (DESYREL) 50 MG tablet TAKE 1/2 TO 1 TABLET(25 TO 50 MG) BY MOUTH AT BEDTIME 30 tablet 6   Vibegron (GEMTESA) 75 MG TABS Take 75 mg by mouth daily.     vitamin B-12 (CYANOCOBALAMIN) 500 MCG tablet Take 1 tablet (500 mcg total) by mouth 2 (two) times a week.     Zinc 50 MG CAPS Take 1  capsule (50 mg total) by mouth daily.  0   Facility-Administered Medications Prior to Visit  Medication Dose Route Frequency Provider Last Rate Last Admin   ondansetron (ZOFRAN) 4 mg in sodium chloride 0.9 % 50 mL IVPB  4 mg Intravenous Q6H PRN Barnett Abu, MD         Per HPI unless specifically indicated in ROS section below Review of Systems  Objective:  BP 118/64   Pulse 99   Temp 98 F (36.7 C) (Temporal)   Ht 5\' 4"  (1.626 m)   Wt 150 lb 2 oz (68.1 kg)   SpO2 96%   BMI 25.77 kg/m   Wt Readings from Last 3 Encounters:  06/15/23 150 lb 2 oz (68.1 kg)  06/05/23 151 lb (68.5 kg)  06/05/23 150 lb 12.8 oz (68.4 kg)      Physical Exam Vitals and nursing note reviewed.  Constitutional:      Appearance: Normal appearance. She is not ill-appearing.  Skin:    General: Skin is warm and dry.     Findings: Lesion present. No erythema.          Comments:  Abrasion to left anterior shin healing well mostly scabbed over with small open residual area mid wound, no surrounding erythema  Smaller abrasion to right medial leg overlying old bruise, no active bleed or erythema Chronic scabbing overlying left lateral malleolus Bilateral heels with skin thickening and chronic appearing pressure ulcers  Neurological:     Mental Status: She is alert.  Psychiatric:        Mood and Affect: Mood normal.        Behavior: Behavior normal.       Results for orders placed or performed in visit on 06/05/23  WOUND CULTURE   Specimen: Wound  Result Value Ref Range   MICRO NUMBER: [id number]    SPECIMEN QUALITY: Adequate    SOURCE: LEFT LEG    STATUS: FINAL    GRAM STAIN:      Rare epithelial cells No white blood cells seen Moderate Gram negative bacilli   ISOLATE 1: Pseudomonas aeruginosa (A)       Susceptibility   Pseudomonas aeruginosa - AEROBIC CULT, GRAM STAIN NEGATIVE 1    CEFTAZIDIME 4 Sensitive     CEFEPIME 16 Intermediate     CIPROFLOXACIN 0.5 Sensitive     LEVOFLOXACIN 2  Intermediate     GENTAMICIN 8 Intermediate     IMIPENEM 2 Sensitive     PIP/TAZO 8 Sensitive     TOBRAMYCIN* <=1 Sensitive      *  Legend: S = Susceptible  I = Intermediate R = Resistant  NS = Not susceptible SDD = Susceptible Dose Dependent * = Not Tested  NR = Not Reported **NN = See Therapy Comments    *Note: Due to a large number of results and/or encounters for the requested time period, some results have not been displayed. A complete set of results can be found in Results Review.    Assessment & Plan:   Problem List Items Addressed This Visit     Venous ulcer of leg (HCC) - Primary    Wound culture grew pseudomonas.  This is improving after cipro 500mg  BID 5d course however she had significant nausea while on this medicine.  No further abx needed.  Home care reviewed.       Arterial leg ulcer (HCC)    Persistent poorly healing scab to left lateral malleolus, pending ABIs ordered last week.       Pressure sore on heel    Bilateral heel pressure sores, chronic.  Discussed offloading as much as able.  Will await HH wound care eval - would recommend foam pressure sore dressing.         No orders of the defined types were placed in this encounter.   No orders of the defined types were placed in this encounter.   Patient Instructions  Leg wound continues healing well. Ok to just use small amt of antibiotic ointment and small bandage to residual open area.  I will wait to see if home health wound care can come out to house to set you up with off loading foam dressings for heels and ankle wounds.   Follow up plan: No follow-ups on file.  [name], MD

## 2023-06-18 ENCOUNTER — Other Ambulatory Visit: Payer: Self-pay | Admitting: Family Medicine

## 2023-06-18 DIAGNOSIS — N182 Chronic kidney disease, stage 2 (mild): Secondary | ICD-10-CM

## 2023-06-18 DIAGNOSIS — E785 Hyperlipidemia, unspecified: Secondary | ICD-10-CM

## 2023-06-18 DIAGNOSIS — E538 Deficiency of other specified B group vitamins: Secondary | ICD-10-CM

## 2023-06-18 DIAGNOSIS — I4819 Other persistent atrial fibrillation: Secondary | ICD-10-CM

## 2023-06-18 DIAGNOSIS — E559 Vitamin D deficiency, unspecified: Secondary | ICD-10-CM

## 2023-06-21 ENCOUNTER — Other Ambulatory Visit (INDEPENDENT_AMBULATORY_CARE_PROVIDER_SITE_OTHER): Payer: Medicare Other

## 2023-06-21 ENCOUNTER — Telehealth: Payer: Self-pay | Admitting: Family Medicine

## 2023-06-21 DIAGNOSIS — E538 Deficiency of other specified B group vitamins: Secondary | ICD-10-CM

## 2023-06-21 DIAGNOSIS — I4819 Other persistent atrial fibrillation: Secondary | ICD-10-CM

## 2023-06-21 DIAGNOSIS — E785 Hyperlipidemia, unspecified: Secondary | ICD-10-CM

## 2023-06-21 DIAGNOSIS — D72810 Lymphocytopenia: Secondary | ICD-10-CM

## 2023-06-21 DIAGNOSIS — R2689 Other abnormalities of gait and mobility: Secondary | ICD-10-CM

## 2023-06-21 DIAGNOSIS — N182 Chronic kidney disease, stage 2 (mild): Secondary | ICD-10-CM | POA: Diagnosis not present

## 2023-06-21 DIAGNOSIS — E559 Vitamin D deficiency, unspecified: Secondary | ICD-10-CM | POA: Diagnosis not present

## 2023-06-21 DIAGNOSIS — M545 Low back pain, unspecified: Secondary | ICD-10-CM

## 2023-06-21 DIAGNOSIS — L89600 Pressure ulcer of unspecified heel, unstageable: Secondary | ICD-10-CM

## 2023-06-21 LAB — CBC WITH DIFFERENTIAL/PLATELET
Basophils Absolute: 0.1 10*3/uL (ref 0.0–0.1)
Basophils Relative: 0.9 % (ref 0.0–3.0)
Eosinophils Absolute: 0.2 10*3/uL (ref 0.0–0.7)
Eosinophils Relative: 2.6 % (ref 0.0–5.0)
HCT: 35.3 % — ABNORMAL LOW (ref 36.0–46.0)
Hemoglobin: 11.3 g/dL — ABNORMAL LOW (ref 12.0–15.0)
Lymphocytes Relative: 5.7 % — ABNORMAL LOW (ref 12.0–46.0)
Lymphs Abs: 0.4 10*3/uL — ABNORMAL LOW (ref 0.7–4.0)
MCHC: 32 g/dL (ref 30.0–36.0)
MCV: 102.6 fl — ABNORMAL HIGH (ref 78.0–100.0)
Monocytes Absolute: 0.6 10*3/uL (ref 0.1–1.0)
Monocytes Relative: 9.5 % (ref 3.0–12.0)
Neutro Abs: 5 10*3/uL (ref 1.4–7.7)
Neutrophils Relative %: 81.3 % — ABNORMAL HIGH (ref 43.0–77.0)
Platelets: 81 10*3/uL — ABNORMAL LOW (ref 150.0–400.0)
RBC: 3.44 Mil/uL — ABNORMAL LOW (ref 3.87–5.11)
RDW: 15.9 % — ABNORMAL HIGH (ref 11.5–15.5)
WBC: 6.1 10*3/uL (ref 4.0–10.5)

## 2023-06-21 LAB — COMPREHENSIVE METABOLIC PANEL
ALT: 11 U/L (ref 0–35)
AST: 28 U/L (ref 0–37)
Albumin: 3.4 g/dL — ABNORMAL LOW (ref 3.5–5.2)
Alkaline Phosphatase: 65 U/L (ref 39–117)
BUN: 15 mg/dL (ref 6–23)
CO2: 34 meq/L — ABNORMAL HIGH (ref 19–32)
Calcium: 9.1 mg/dL (ref 8.4–10.5)
Chloride: 101 meq/L (ref 96–112)
Creatinine, Ser: 0.88 mg/dL (ref 0.40–1.20)
GFR: 63.14 mL/min (ref >60.00)
Glucose, Bld: 79 mg/dL (ref 70–99)
Potassium: 4.1 meq/L (ref 3.5–5.1)
Sodium: 140 meq/L (ref 135–145)
Total Bilirubin: 0.8 mg/dL (ref 0.2–1.2)
Total Protein: 6.2 g/dL (ref 6.0–8.3)

## 2023-06-21 LAB — LIPID PANEL
Cholesterol: 152 mg/dL (ref 0–200)
HDL: 67.7 mg/dL (ref >39.00)
LDL Cholesterol: 67 mg/dL (ref 0–99)
NonHDL: 84.39
Total CHOL/HDL Ratio: 2
Triglycerides: 85 mg/dL (ref 0.0–149.0)
VLDL: 17 mg/dL (ref 0.0–40.0)

## 2023-06-21 LAB — MICROALBUMIN / CREATININE URINE RATIO
Creatinine,U: 70.6 mg/dL
Microalb Creat Ratio: 4.5 mg/g (ref 0.0–30.0)
Microalb, Ur: 3.2 mg/dL — ABNORMAL HIGH (ref 0.0–1.9)

## 2023-06-21 LAB — VITAMIN D 25 HYDROXY (VIT D DEFICIENCY, FRACTURES): VITD: 60.82 ng/mL (ref 30.00–100.00)

## 2023-06-21 LAB — VITAMIN B12: Vitamin B-12: 633 pg/mL (ref 211–911)

## 2023-06-21 LAB — PHOSPHORUS: Phosphorus: 3.7 mg/dL (ref 2.3–4.6)

## 2023-06-21 NOTE — Telephone Encounter (Signed)
Received message that home health nurse didn't accept pt for wound care.  Plz notify pt - I will place referral to wound clinic to evaluate pressure sores to both heels.  Also - last time we referred to outpatient PT (03/2023), she didn't go. Is she willing to try outpatient PT again for fall prevention/balance training?

## 2023-06-22 ENCOUNTER — Other Ambulatory Visit: Payer: Medicare Other

## 2023-06-22 ENCOUNTER — Encounter: Payer: Self-pay | Admitting: *Deleted

## 2023-06-22 DIAGNOSIS — D72810 Lymphocytopenia: Secondary | ICD-10-CM

## 2023-06-22 NOTE — Telephone Encounter (Signed)
Outpatient PT referral placed.

## 2023-06-22 NOTE — Addendum Note (Signed)
Addended by: [name] on: 06/22/2023 01:24 PM   Modules accepted: Orders

## 2023-06-22 NOTE — Addendum Note (Signed)
Addended by: Alvina Chou on: 06/22/2023 10:09 AM   Modules accepted: Orders

## 2023-06-22 NOTE — Telephone Encounter (Signed)
Spoke with pt relaying Dr Timoteo Expose message. Pt verbalizes understanding stating wound care came out yesterday and informed pt they could not service her b/c pt is not home bound. However, Stephanie (wound care nurse) did take a quick look at pt's sores and stated they looked good. Per pt, Judeth Cornfield was going to reach out to Dr Reece Agar.  Also, pt agrees to outpatient PT referral.

## 2023-06-23 ENCOUNTER — Telehealth: Payer: Self-pay | Admitting: *Deleted

## 2023-06-23 ENCOUNTER — Other Ambulatory Visit: Payer: Self-pay | Admitting: Urology

## 2023-06-23 ENCOUNTER — Telehealth: Payer: Self-pay | Admitting: Family Medicine

## 2023-06-23 ENCOUNTER — Telehealth: Payer: Self-pay | Admitting: Cardiovascular Disease

## 2023-06-23 LAB — PATHOLOGIST SMEAR REVIEW

## 2023-06-23 NOTE — Telephone Encounter (Signed)
Judeth Cornfield from [facility] called to let Dr. Reece Agar know she saw pt on 9/11 for [facility] [address] eval. Judeth Cornfield stated through Medicare Guidelines, pt wasn't appropriate for [facility] due to pt being able to drive & do other things by herself. Judeth Cornfield stated the pt was open to going to outpatient PT in [place]. Judeth Cornfield stated as far as the pt's wounds, they appear to be simple cat scratches, pt admitted to bumping herself against something outside recently, doesn't appear to be a ulcer. Judeth Cornfield stated the pt's heel doesn't seem to be a ucler as well, seems to be some redness/pressure. Judeth Cornfield stated she instructed pt on what to do with her heel. Call back # [phone], secured.

## 2023-06-23 NOTE — Telephone Encounter (Signed)
Pt called back and has been scheduled for tele pre op appt 06/29/23 @ 3 pm. Med rec and consent are done.     Ok per Perlie Gold, PAC to add on due to procedure date and med hold. I will update all parties involved.

## 2023-06-23 NOTE — Telephone Encounter (Addendum)
Name: [name]  DOB: 1946-04-24  MRN: [id number]  Primary Cardiologist: Thurmon Fair, MD   Preoperative team, please contact this patient and set up a phone call appointment for further preoperative risk assessment. Please obtain consent and complete medication review. Thank you for your help.  I confirm that guidance regarding antiplatelet and oral anticoagulation therapy has been completed and, if necessary, noted below.  Per office protocol, patient can hold Eliquis for 3 days prior to procedure.  Please resume Eliquis as soon as possible postprocedure, at the discretion of the surgeon.     Joylene Grapes, NP 06/23/2023, 12:44 PM [facility]

## 2023-06-23 NOTE — Telephone Encounter (Signed)
Patient with diagnosis of afib on Eliquis for anticoagulation.    Procedure:  Ureteroscopy for kidney stones  Date of procedure: 07/03/23   CHA2DS2-VASc Score = 6   This indicates a 9.7% annual risk of stroke. The patient's score is based upon: CHF History: 1 HTN History: 1 Diabetes History: 0 Stroke History: 0 Vascular Disease History: 1 Age Score: 2 Gender Score: 1      CrCl 46 ml/min Platelet count 81  Per office protocol, patient can hold Eliquis for 3 days prior to procedure.    **This guidance is not considered finalized until pre-operative APP has relayed final recommendations.**

## 2023-06-23 NOTE — Telephone Encounter (Signed)
Pt called back and has been scheduled for tele pre op appt 06/29/23 @ 3 pm. Med rec and consent are done.     Patient Consent for Virtual Visit        [name] has provided verbal consent on 06/23/2023 for a virtual visit (video or telephone).   CONSENT FOR VIRTUAL VISIT FOR:  [name]  By participating in this virtual visit I agree to the following:  I hereby voluntarily request, consent and authorize [facility] and its employed or contracted physicians, physician assistants, nurse practitioners or other licensed health care professionals (the Practitioner), to provide me with telemedicine health care services (the "Services") as deemed necessary by the treating Practitioner. I acknowledge and consent to receive the Services by the Practitioner via telemedicine. I understand that the telemedicine visit will involve communicating with the Practitioner through live audiovisual communication technology and the disclosure of certain medical information by electronic transmission. I acknowledge that I have been given the opportunity to request an in-person assessment or other available alternative prior to the telemedicine visit and am voluntarily participating in the telemedicine visit.  I understand that I have the right to withhold or withdraw my consent to the use of telemedicine in the course of my care at any time, without affecting my right to future care or treatment, and that the Practitioner or I may terminate the telemedicine visit at any time. I understand that I have the right to inspect all information obtained and/or recorded in the course of the telemedicine visit and may receive copies of available information for a reasonable fee.  I understand that some of the potential risks of receiving the Services via telemedicine include:  Delay or interruption in medical evaluation due to technological equipment failure or disruption; Information transmitted may  not be sufficient (e.g. poor resolution of images) to allow for appropriate medical decision making by the Practitioner; and/or  In rare instances, security protocols could fail, causing a breach of personal health information.  Furthermore, I acknowledge that it is my responsibility to provide information about my medical history, conditions and care that is complete and accurate to the best of my ability. I acknowledge that Practitioner's advice, recommendations, and/or decision may be based on factors not within their control, such as incomplete or inaccurate data provided by me or distortions of diagnostic images or specimens that may result from electronic transmissions. I understand that the practice of medicine is not an exact science and that Practitioner makes no warranties or guarantees regarding treatment outcomes. I acknowledge that a copy of this consent can be made available to me via my patient portal [facility] MyChart), or I can request a printed copy by calling the office of  [facility].    I understand that my insurance will be billed for this visit.   I have read or had this consent read to me. I understand the contents of this consent, which adequately explains the benefits and risks of the Services being provided via telemedicine.  I have been provided ample opportunity to ask questions regarding this consent and the Services and have had my questions answered to my satisfaction. I give my informed consent for the services to be provided through the use of telemedicine in my medical care

## 2023-06-23 NOTE — Telephone Encounter (Signed)
Pre-operative Risk Assessment    Patient Name: [name]  DOB: August 07, 1946 MRN: [id number]      Request for Surgical Clearance    Procedure:   Ureteroscopy for kidney stones   Date of Surgery:  Clearance 07/03/23                                 Surgeon:  Dr. Alvester Morin  Surgeon's Group or Practice Name:  [facility]  Phone number:  [phone] Fax number:  [phone]   Type of Clearance Requested:   - Medical  - Pharmacy:  Hold Apixaban (Eliquis)     Type of Anesthesia:  General    Additional requests/questions:    Alben Spittle   06/23/2023, 9:01 AM

## 2023-06-23 NOTE — Telephone Encounter (Signed)
Left message to call back to set up tele pre op appt. Ok per preop APP Perlie Gold, PAC ok to add on due to procedure date and no availability.

## 2023-06-25 ENCOUNTER — Other Ambulatory Visit: Payer: Self-pay | Admitting: Family Medicine

## 2023-06-26 NOTE — Telephone Encounter (Signed)
Noted. Referred to outpatient PT. Referred to wound clinic for heel pressure wounds.

## 2023-06-26 NOTE — Telephone Encounter (Signed)
LAST APPOINTMENT DATE: 06/15/23 acute    NEXT APPOINTMENT DATE: 06/28/2023    LAST REFILL: 11/22/22   QTY: #[id number]

## 2023-06-27 NOTE — Therapy (Unsigned)
OUTPATIENT PHYSICAL THERAPY NEURO EVALUATION   Patient Name: [name] MRN: [id number] DOB:October 28, 1945, 77 y.o., female Today's Date: 06/29/2023   PCP:   [name], MD   REFERRING PROVIDER:   [name], MD    END OF SESSION:  PT End of Session - 06/28/23 1446     Visit Number 1    Number of Visits 24    Date for PT Re-Evaluation 09/20/23    Progress Note Due on Visit 10    PT Start Time 1446    PT Stop Time 1528    PT Time Calculation (min) 42 min    Equipment Utilized During Treatment Gait belt    Activity Tolerance Patient tolerated treatment well    Behavior During Therapy [facility] for tasks assessed/performed             Past Medical History:  Diagnosis Date   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 Known DISH with thoracic spine fusion and moderate kyphosis, h/o L spine fusion L2-5   Adjustment disorder with depressed mood 10/19/2014   45 yo son died MI 2014-09-29 Husband died car accident Mar 31, 2015   Anemia    Anticoagulant long-term use 06/14/2019   She takes warfarin for afib/flutter with CHADS2VASc score of 4 (age, sex, CHF, HTN), s/p 2 bioprosthetic valve replacements Coumadin changed to eliquis 2022 per cardiology   Aortic atherosclerosis 03/03/2022   Noted on 01/2022 chest CT   Arthritis    Arthropathy of spinal facet joint 03/08/2018   Ascending aortic aneurysm 03/02/2021   4.1cm on CT 01/2021 rec yearly monitoring   Bilateral hip pain 09/08/2016   Cancer (HCC)    conon cancer 2012   CAP (community acquired pneumonia) 02/08/2016   Carotid stenosis 12/08/2020   Carotid US - 40-59% BICA stenosis (2015) Carotid US 12/2020 - 1-39% BICA stenosis, incidental 3.4cm structure behind L common carotid artery rec further imaging    Cataracts, bilateral     immature   Chest pain 08/25/2014   myoview low risk 02/2018 (Camitz)   Chronic cholecystitis with calculus 01/21/2015   S/p cholecystectomy    Chronic diastolic heart failure 09/19/2014   Chronic insomnia    Chronic lower back pain 03/05/2008   Returned to Dr Danielle Dess - translaminar ESI at L3/4. Myelogram showed significant left sided foraminal stenosis at L2/3 and L3/4 in setting of solid arthrodesis, planned DEXA and if stable osteopenia, considering anterolateral decompression with spacer.    CKD (chronic kidney disease) stage 2, GFR 60-89 ml/min 07/03/2015   Constipation    takes Miralax daily as needed   COPD (chronic obstructive pulmonary disease)    Albuterol inhaler daily as needed;Duoneb daily as needed;Spiriva daily   DDD (degenerative disc disease)    cervical - kyphosis with mod DD changes C5/6 and C6/7; lumbar - early DD at L2/3 (Elsner)   Depression    DISH (diffuse idiopathic skeletal hyperostosis) 05/14/2019   By MRI noted fusion of lower thoracic vertebrae - likely contributing to unsteadiness (Elsner)   Dyspnea    with exertion   Dyspnea on exertion 06/05/2015   myoview low risk 02/2018   Edema of both lower legs due to peripheral venous insufficiency    Essential hypertension, benign 03/24/2009   hx of not on nmeds currenlty and has been a while since on meds per pt  Heart murmur    Hematuria 06/17/2021   History of kidney stones    History of radiation therapy 05/30/11 to 07/07/11   rectum   History of rectal cancer 05/26/2011   Rectal cancer 2012, midrectum [id number], s/p neoadj chemo&XRT, lap LAR with diverting loop ileostomy 08/2011, ileostomy takedown 03/2012 Discharged from oncology clinic 2017 Truett Perna)   History of shingles    History of vertebral fracture 11/26/2019   Old L1 vertebral compression fracture incidentally noted on imaging 2020    HLD (hyperlipidemia) 05/03/2007   Hyperlipemia    takes Lovastatin daily   Lacunar infarction    Left caudate    Left hip pain 07/29/2020   Legally blind in left eye    Lung nodule    RLL nodule-91mm stable 2006, April 2009, and June 2009   Lymphangioma 01/07/2021   Noted incidentally on carotid US 12/2020 CT neck - Likely benign lymphangioma 01/2021   Macrocytosis 03/27/2022   Folate and b12 checked 2023   Malaise and fatigue 01/22/2018   Meralgia paresthetica 12/13/2007   Mixed stress and urge urinary incontinence 03/16/2015   Failed oxybutynin IR/ER. Vesicare initially helped but then no longer helpful.  myrbetriq was not effective.  Saw urology (MacDiarmid) - UDS largely overactive bladder with mild-mod stress incontinence. Improved on abx course - maintained on [place].    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   Pulmonary infiltrates 04/23/2019   06/13/2018-CT super D chest without contrast- generally stable chronic lung disease with pleural parenchymal scarring and scattered nodularity most of these nodules have been tree-in-bud in appearance likely postinfectious or inflammatory the dominant right  lower lobe nodule seen on most recent study has resolved no new or enlarging nodules  04/22/2019-CT chest without contrast- waxing and waning per   Pulmonary nodule 02/23/2012   RLL nodule-53mm stable 2006, April 2009, and June 2009 Small pulm nodules Jan 2012 - > Oct 2012 without change   Rheumatic heart disease mitral stenosis    mod MS by echo 02/2014   Right leg swelling 06/11/2020   R venous US 06/2020 WNL: - No evidence of deep vein thrombosis in the lower extremity. No indirect evidence of obstruction proximal to the inguinal ligament. - No cystic structure found in the popliteal fossa.   S/P aortic and mitral valve bioprostheses 12/08/2013   AVR 21 mm, MVR 25 mm-MagnaEase pericardia   S/P AVR (aortic valve replacement) 2015   bioprosthetic (Bartle)   S/P MVR (mitral valve replacement) 2015   bioprosthetic (Bartle)   Sepsis secondary to UTI (HCC) 08/22/2017   Severe aortic valve stenosis 10/01/2013   mild-mod by echo 02/2014   Skin lesion  01/03/2022   Small bowel obstruction, partial 08/2013   reolved without NGT placement.    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   Dr Amy [place]   Syncope 05/28/2018   Thrombocytopenia 11/04/2019   Typical atrial flutter 05/15/2019   Vitamin B12 deficiency 02/28/2015   Start B12 shots 02/2015    Vitamin D deficiency 10/23/2016   Past Surgical History:  Procedure Laterality Date   ANTERIOR LAT LUMBAR FUSION Left 07/17/2018   Procedure: Lumbar Two-Three Lumbar Three-Four Anterolateral decompression/interbody fusion with lateral plate fixation/Infuse;  Surgeon: Barnett Abu, MD;  Location: [facility] OR;  Service: Neurosurgery;  Laterality: Left;  Lumbar Two-Three Lumbar Three-Four Anterolateral decompression/interbody fusion with lateral plate fixation/Infuse   AORTIC VALVE REPLACEMENT N/A 12/12/2013   Procedure: AORTIC VALVE REPLACEMENT (AVR);  Surgeon: Alleen Borne, MD; Service: Open Heart Surgery   BACK SURGERY  2006, 2007   2006  SPACER, 2007 decompression and fusion L4/5   BOWEL RESECTION  04/02/2012   Procedure: SMALL BOWEL RESECTION;  Surgeon: [name], MD;  Location: [facility] ORS;  Service: General;  Laterality: N/A;   BRONCHIAL WASHINGS  03/24/2021   Procedure: BRONCHIAL WASHINGS;  Surgeon: [name], MD;  Location: [facility] ENDOSCOPY;  Service: Endoscopy;;   CARPAL TUNNEL RELEASE Bilateral    CATARACT EXTRACTION W/ INTRAOCULAR LENS IMPLANT Left 10/18/2016   CATARACT EXTRACTION W/ INTRAOCULAR LENS IMPLANT Right 12/13/2016   Dr. Dagoberto Ligas   CHOLECYSTECTOMY N/A 01/21/2015   chronic cholecystitis, [name], MD   COLON RESECTION  08/25/2011   Procedure: COLON RESECTION LAPAROSCOPIC;  Surgeon: [name], MD;  Location: [facility] ORS;  Service: General;  Laterality: N/A;  Laparoscopic Assisted Low Anterior Resection Diverting Ostomy and onQ pain pump   COLONOSCOPY  03/2013   1 polyp, rpt 3 yrs Christella Hartigan)   COLONOSCOPY  05/2021   diverticulosis and patent colon anastomosis with some edema/narrowing, rpt 5 yrs Christella Hartigan)   COLONOSCOPY WITH PROPOFOL N/A 06/09/2016   patent colo-colonic anastomosis, rpt 5 yrs Christella Hartigan)   EP IMPLANTABLE DEVICE N/A 06/16/2016   Procedure: Pacemaker Implant;  Surgeon: Will Jorja Loa, MD;  Location: [facility] INVASIVE CV LAB;  Service: Cardiovascular;  Laterality: N/A;   FOOT SURGERY  left foot   hammer toe   ILEOSTOMY  08/25/2011   ILEOSTOMY CLOSURE  04/02/2012   Procedure: ILEOSTOMY TAKEDOWN;  Surgeon: [name], MD;  Location: [facility] ORS;  Service: General;  Laterality: N/A;   INTRAOPERATIVE TRANSESOPHAGEAL ECHOCARDIOGRAM N/A 12/12/2013   Procedure: INTRAOPERATIVE TRANSESOPHAGEAL ECHOCARDIOGRAM;  Surgeon: Alleen Borne, MD;  Location: [facility] OR;  Service: Open Heart Surgery;  Laterality: N/A;   LEFT AND RIGHT HEART CATHETERIZATION WITH CORONARY ANGIOGRAM N/A 11/27/2013   Procedure: LEFT AND RIGHT HEART CATHETERIZATION WITH CORONARY ANGIOGRAM;  Surgeon: Thurmon Fair, MD;  Location: [facility] CATH  LAB;  Service: Cardiovascular;  Laterality: N/A;   MITRAL VALVE REPLACEMENT N/A 12/12/2013   Procedure: MITRAL VALVE (MV) REPLACEMENT OR REPAIR;  Surgeon: Alleen Borne, MD; Service: Open Heart Surgery   TEE WITHOUT CARDIOVERSION N/A 11/29/2013   Procedure: TRANSESOPHAGEAL ECHOCARDIOGRAM (TEE);  Surgeon: Quintella Reichert, MD;  Location: [facility] ENDOSCOPY;  Service: Cardiovascular;  Laterality: N/A;   TEE WITHOUT CARDIOVERSION N/A 06/04/2019   Procedure: TRANSESOPHAGEAL ECHOCARDIOGRAM (TEE);  Surgeon: Thurmon Fair, MD;  Location: [facility] ENDOSCOPY;  Service: Cardiovascular;  Laterality: N/A;   TONSILLECTOMY     VIDEO BRONCHOSCOPY N/A 03/24/2021   Procedure: VIDEO BRONCHOSCOPY WITHOUT FLUORO;  Surgeon: [name], MD;  Location: [facility] ENDOSCOPY;  Service: Endoscopy;  Laterality: N/A;  SMALL SCOPE,PREMEDICATE WITH NEBULIZED LIDOCAINE   Patient Active Problem List   Diagnosis Date Noted   Pressure sore on heel 06/15/2023   Lump of skin 06/06/2023   Arterial leg ulcer (HCC) 06/06/2023   BRBPR (bright red blood per rectum) 04/24/2023   Head injury 04/24/2023   Cause of injury, fall 03/13/2023   Imbalance 03/13/2023   Venous ulcer of leg (HCC) 01/24/2023   COVID-19 virus infection 11/11/2022   Fatigue 10/05/2022   Positive ANA (antinuclear antibody) 05/30/2022   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   Lacunar infarction (HCC)    Macrocytosis 03/27/2022   Aortic atherosclerosis 03/03/2022   Peripheral neuropathy 01/03/2022   Leg ulcer (HCC) 06/28/2021   Hematuria 06/17/2021   Pulmonary artery hypertension 06/17/2021   Pacemaker 03/02/2021   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   Ascending aortic aneurysm (HCC) 03/02/2021   Lymphangioma 01/07/2021   Persistent atrial fibrillation 12/09/2020   Carotid stenosis 12/08/2020   Left hip pain 07/29/2020   Right leg swelling 06/11/2020   History of vertebral fracture 11/26/2019   Acute midline low back pain without sciatica 11/26/2019    Thrombocytopenia 11/04/2019   Anticoagulant long-term use 06/14/2019   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   Typical atrial flutter 05/15/2019   DISH (diffuse idiopathic skeletal hyperostosis) 05/14/2019   Pulmonary infiltrates 04/23/2019   Other spondylosis with radiculopathy, lumbar region 07/17/2018   Syncope 05/28/2018   Arthropathy of spinal facet joint 03/08/2018   Malaise and fatigue 01/22/2018   Pain of right sacroiliac joint 08/09/2017   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 12/31/2016   Vitamin D deficiency 10/23/2016   Bilateral hip pain 09/08/2016   Personal history of colonic polyps    Pedal edema 02/25/2016   Advanced care planning/counseling discussion 10/28/2015   CKD (chronic kidney disease) stage 2, GFR 60-89 ml/min 07/03/2015   Dyspnea on exertion 06/05/2015   Mixed stress and urge urinary incontinence 03/16/2015   Vitamin B12 deficiency 02/28/2015   Chronic insomnia 02/25/2015   Osteoporosis 12/09/2014   Medicare annual wellness visit, subsequent 10/15/2014   Health maintenance examination 10/15/2014   Adjustment disorder with mixed anxiety and depressed mood 10/15/2014   Chronic diastolic heart failure 09/19/2014   Chest pain 08/25/2014   S/P aortic and mitral valve bioprostheses 12/2013   Pulmonary nodules 02/23/2012   History of radiation therapy    History of rectal cancer 05/26/2011   Essential hypertension, benign 03/24/2009   Overweight (BMI 25.0-29.9) 03/05/2008   Chronic lower back pain 03/05/2008   Meralgia paresthetica 12/13/2007   GERD (gastroesophageal reflux disease) 08/06/2007   HLD (hyperlipidemia) 05/03/2007   COPD (chronic obstructive pulmonary disease) 05/03/2007    ONSET DATE: 06/10/22  REFERRING DIAG:  M54.50,G89.29 (ICD-10-CM) - Chronic midline low back pain without sciatica  R26.89 (ICD-10-CM) - Imbalance    THERAPY DIAG:  Abnormality of gait and mobility  Difficulty in walking, not elsewhere classified  Muscle weakness  (generalized)  Other abnormalities of gait and mobility  Rationale for Evaluation and Treatment: Rehabilitation  SUBJECTIVE:  SUBJECTIVE STATEMENT: Pt reports with her balance she is falling in scenarios where she begins to feel off balance then she falls. She had a fall when putting out round up and other loss of balance when she was repotting a plant and lost her balance.  Both of her fall she noted were in flexed posture for prolonged period likely resulting in inability to perform adequate extension to get out of this posture resulting in fall.  Pt also has low back pain from " disc fragments" that are in her back. The pain worsens with longer standing. Pt low back pain for years. She cannot go into extension and cannot lay on her stomach. Pt accompanied by: self  PERTINENT HISTORY: Patient has a long history of low back pain, arthritis, COPD, hypertension, compression fracture, hyperlipidemia, visual deficits in the left eye,.  PAIN:  Are you having pain? Yes: NPRS scale: 5/10 Pain location: low back across both sides.  Pain description: Dull, aching Aggravating factors: standing, walking Relieving factors: sitting ( flexed postures)  PRECAUTIONS: Fall  RED FLAGS: None   WEIGHT BEARING RESTRICTIONS: No  FALLS: Has patient fallen in last 6 months? Yes. Number of falls 2  LIVING ENVIRONMENT: Lives with: lives with their family Lives in: House/apartment Stairs: Yes: Internal: 3 steps; can reach both Has following equipment at home: Single point cane  PLOF: Independent with basic ADLs  PATIENT GOALS: Improve balance, decrease falls, improve her back pain   OBJECTIVE:   DIAGNOSTIC FINDINGS: From MRI of lumbar spine in June 2024 interpreted by radiologist and impression as below   IMPRESSION: 1. No acute fracture or acute finding. 2. Chronic compression fracture of L1 stable from the prior lumbar spine CT. 3. Postoperative changes from fusions, L2-L3, L3-L4 and L4-L5, also stable from the prior C  COGNITION: Overall cognitive status: Within functional limits for tasks assessed   SENSATION: Not tested   POSTURE: rounded shoulders, decreased lumbar lordosis, increased thoracic kyphosis, and flexed trunk   LOWER EXTREMITY ROM:     MMT Right Eval Left Eval  Hip flexion 3+ 3+  Hip extension    Hip abduction 4- 4-  Hip adduction 4 4  Hip internal rotation    Hip external rotation    Knee flexion 3+ 3+  Knee extension 4   Ankle dorsiflexion 4+ 4+  Ankle plantarflexion    Ankle inversion    Ankle eversion     (Blank rows = not tested)   (Blank rows = not tested)  BED MOBILITY:  Pt does not sleep in the bed, currently sleeps in recliner  TRANSFERS: Assistive device utilized: Single point cane  Sit to stand: Modified independence Stand to sit: Modified independence Chair to chair: Modified independence Floor:  does not attempt   STAIRS: Level of Assistance: SBA Stair Negotiation Technique: Alternating Pattern  with Bilateral Rails Number of Stairs: 4  Height of Stairs: 6 in  Comments:   GAIT: Gait pattern: decreased arm swing- Right, decreased arm swing- Left, decreased step length- Right, decreased step length- Left, decreased stance time- Right, decreased stance time- Left, and trunk flexed Distance walked: 450 ft Assistive device utilized: Walker - 4 wheeled Level of assistance: Modified independence Comments: flexed posture improved with use of rollator but UE fatigued with use of rollator.   FUNCTIONAL TESTS:  5 times sit to stand: 17 sec with B UE, cannot complete 1 without UE assist   Timed up and go (TUG): 14 sec, UE assist   6 minute  walk test: 450 ft with clinic rollator, did nit complete 6 minutes limited by back pain   Dynamic Gait Index: 10 14.82 sec no AD  Pt will improve DGI by at least 3 points in order to demonstrate clinically significant improvement in balance and decreased risk for falls  [facility] PT Assessment - 06/28/23 0001       Standardized Balance Assessment   Standardized Balance Assessment Dynamic Gait Index      Dynamic Gait Index   Level Surface Mild Impairment    Change in Gait Speed Moderate Impairment    Gait with Horizontal Head Turns Moderate Impairment    Gait with Vertical Head Turns Moderate Impairment    Gait and Pivot Turn Moderate Impairment    Step Over Obstacle Severe Impairment    Step Around Obstacles Mild Impairment    Steps Mild Impairment    Total Score 10             PATIENT SURVEYS:  FOTO 47 RAG of 53  TODAY'S TREATMENT:                                                                                                                               Eval only     PATIENT EDUCATION: Education details: POC Person educated: Patient Education method: Explanation Education comprehension: verbalized understanding  HOME EXERCISE PROGRAM: Establish visit 2   GOALS: Goals reviewed with patient? Yes  SHORT TERM GOALS: Target date: 07/27/2023       Patient will be independent in home exercise program to improve strength/mobility for better functional independence with ADLs. Baseline: No HEP currently  Goal status: INITIAL   LONG TERM GOALS: Target date: 09/21/2023   1.  Patient (> 34 years old) will complete five times sit to stand test in < 15 seconds indicating an increased LE strength and improved balance. Baseline: 17 sec with UE assist  Goal status: INITIAL  2.  Patient will increase FOTO score to equal to or greater than  53   to demonstrate statistically significant improvement in mobility and quality of life.  Baseline: 47 Goal status: INITIAL   3.  Patient will increase DGI Balance score by > 6 points to demonstrate decreased  fall risk during functional activities. Baseline: 10 Goal status: INITIAL   4.   Patient will reduce timed up and go to <11 seconds to reduce fall risk and demonstrate improved transfer/gait ability. Baseline: 14 sec, [place] use for STS  Goal status: INITIAL  5.   Patient will increase 10 meter walk test to >1.71m/s as to improve gait speed for better community ambulation and to reduce fall risk. Baseline: .675 m/s no AD  Goal status: INITIAL  6.   Patient will increase six minute walk test distance to >800 without increase in back pain for progression to community ambulator and improve gait ability Baseline: 450 ft with clinic rollator to offset low back pain  Goal  status: INITIAL    ASSESSMENT:  CLINICAL IMPRESSION: Patient is a 77 year old female who presents to physical therapy for evaluation and treatment for imbalance, muscle weakness, frequent falling, as well as low back pain.  Patient presents with deficits in hip strength as well as deficits in posture likely leading to low back pain with prolonged standing.  Patient did respond well to utilizing a rollator to offset low to her low back but did result in upper extremity fatigue toward middle of 6-minute walk test resulting in ending the test early.  Based on results of this test patient is not safe for community ambulation and would benefit from a rollator that she can use to sit and rest when she is out and about.  Patient also demonstrates deficits in balance as evidenced by dynamic gait index resulting in increased risk of falls as well as demonstrates decreased lower extremity strength as evidenced by manual muscle testing and 5 times sit to stand resulting again and increased risk of falls.  Patient will benefit from skilled physical therapy in order to improve her lower extremity strength, balance, decrease risk of falls, and improve her overall quality of life.  OBJECTIVE IMPAIRMENTS: Abnormal gait, decreased activity tolerance,  decreased balance, decreased endurance, decreased mobility, difficulty walking, decreased ROM, decreased strength, hypomobility, and pain.   ACTIVITY LIMITATIONS: carrying, lifting, standing, sleeping, stairs, transfers, and locomotion level  PARTICIPATION LIMITATIONS: driving, community activity, and yard work  PERSONAL FACTORS: Age, Fitness, Time since onset of injury/illness/exacerbation, and 3+ comorbidities:  arthritis, COPD, hypertension, compression fracture, hyperlipidemia, visual deficits in the left eye  are also affecting patient's functional outcome.   REHAB POTENTIAL: Good  CLINICAL DECISION MAKING: Stable/uncomplicated  EVALUATION COMPLEXITY: Low  PLAN:  PT FREQUENCY: 2x/week  PT DURATION: 12 weeks  PLANNED INTERVENTIONS: Therapeutic exercises, Therapeutic activity, Neuromuscular re-education, Balance training, Gait training, Patient/Family education, Self Care, Joint mobilization, Joint manipulation, Stair training, Dry Needling, and Manual therapy  PLAN FOR NEXT SESSION: HEP, balance, UE strength for walker use, postural strengthening ( post chain), balance    Norman Herrlich, PT 06/29/2023, 7:53 AM

## 2023-06-27 NOTE — Patient Instructions (Signed)
SURGICAL WAITING ROOM VISITATION  Patients having surgery or a procedure may have no more than 2 support people in the waiting area - these visitors may rotate.    Children under the age of 13 must have an adult with them who is not the patient.  Due to an increase in RSV and influenza rates and associated hospitalizations, children ages 61 and under may not visit patients in [facility] hospitals.  If the patient needs to stay at the hospital during part of their recovery, the visitor guidelines for inpatient rooms apply. Pre-op nurse will coordinate an appropriate time for 1 support person to accompany patient in pre-op.  This support person may not rotate.    Please refer to the [facility] website for the visitor guidelines for Inpatients (after your surgery is over and you are in a regular room).       Your procedure is scheduled on: 07/03/23    Report to [facility] Main Entrance    Report to admitting at  130 pm    Call this number if you have problems the morning of surgery [phone]   Do not eat food or drink liquids  :After Midnight.                                          If you have questions, please contact your surgeon's office.       Oral Hygiene is also important to reduce your risk of infection.                                    Remember - BRUSH YOUR TEETH THE MORNING OF SURGERY WITH YOUR REGULAR TOOTHPASTE  DENTURES WILL BE REMOVED PRIOR TO SURGERY PLEASE DO NOT APPLY "Poly grip" OR ADHESIVES!!!   Do NOT smoke after Midnight   Stop all vitamins and herbal supplements 7 days before surgery.   Take these medicines the morning of surgery with A SIP OF WATER:  inhalers as usual and bring, , wellbutrin nebulizer if needed   DO NOT TAKE ANY ORAL DIABETIC MEDICATIONS DAY OF YOUR SURGERY  Bring CPAP mask and tubing day of surgery.                              You may not have any metal on your body including hair pins, jewelry, and body  piercing             Do not wear make-up, lotions, powders, perfumes/cologne, or deodorant  Do not wear nail polish including gel and S&S, artificial/acrylic nails, or any other type of covering on natural nails including finger and toenails. If you have artificial nails, gel coating, etc. that needs to be removed by a nail salon please have this removed prior to surgery or surgery may need to be canceled/ delayed if the surgeon/ anesthesia feels like they are unable to be safely monitored.   Do not shave  48 hours prior to surgery.               Men may shave face and neck.   Do not bring valuables to the hospital. [facility] IS NOT             RESPONSIBLE   FOR VALUABLES.  Contacts, glasses, dentures or bridgework may not be worn into surgery.   Bring small overnight bag day of surgery.   DO NOT BRING YOUR HOME MEDICATIONS TO THE HOSPITAL. PHARMACY WILL DISPENSE MEDICATIONS LISTED ON YOUR MEDICATION LIST TO YOU DURING YOUR ADMISSION IN THE HOSPITAL!    Patients discharged on the day of surgery will not be allowed to drive home.  Someone NEEDS to stay with you for the first 24 hours after anesthesia.   Special Instructions: Bring a copy of your healthcare power of attorney and living will documents the day of surgery if you haven't scanned them before.              Please read over the following fact sheets you were given: IF YOU HAVE QUESTIONS ABOUT YOUR PRE-OP INSTRUCTIONS PLEASE CALL [phone]   If you received a COVID test during your pre-op visit  it is requested that you wear a mask when out in public, stay away from anyone that may not be feeling well and notify your surgeon if you develop symptoms. If you test positive for Covid or have been in contact with anyone that has tested positive in the last 10 days please notify you surgeon.    [name] - Preparing for Surgery Before surgery, you can play an important role.  Because skin is not sterile, your skin needs to be  as free of germs as possible.  You can reduce the number of germs on your skin by washing with CHG (chlorahexidine gluconate) soap before surgery.  CHG is an antiseptic cleaner which kills germs and bonds with the skin to continue killing germs even after washing. Please DO NOT use if you have an allergy to CHG or antibacterial soaps.  If your skin becomes reddened/irritated stop using the CHG and inform your nurse when you arrive at Short Stay. Do not shave (including legs and underarms) for at least 48 hours prior to the first CHG shower.  You may shave your face/neck. Please follow these instructions carefully:  1.  Shower with CHG Soap the night before surgery and the  morning of Surgery.  2.  If you choose to wash your hair, wash your hair first as usual with your  normal  shampoo.  3.  After you shampoo, rinse your hair and body thoroughly to remove the  shampoo.                           4.  Use CHG as you would any other liquid soap.  You can apply chg directly  to the skin and wash                       Gently with a scrungie or clean washcloth.  5.  Apply the CHG Soap to your body ONLY FROM THE NECK DOWN.   Do not use on face/ open                           Wound or open sores. Avoid contact with eyes, ears mouth and genitals (private parts).                       Wash face,  Genitals (private parts) with your normal soap.             6.  Wash thoroughly, paying special attention to the area where  your surgery  will be performed.  7.  Thoroughly rinse your body with warm water from the neck down.  8.  DO NOT shower/wash with your normal soap after using and rinsing off  the CHG Soap.                9.  Pat yourself dry with a clean towel.            10.  Wear clean pajamas.            11.  Place clean sheets on your bed the night of your first shower and do not  sleep with pets. Day of Surgery : Do not apply any lotions/deodorants the morning of surgery.  Please wear clean clothes to the  hospital/surgery center.  FAILURE TO FOLLOW THESE INSTRUCTIONS MAY RESULT IN THE CANCELLATION OF YOUR SURGERY PATIENT SIGNATURE_________________________________  NURSE SIGNATURE__________________________________  ________________________________________________________________________

## 2023-06-27 NOTE — Progress Notes (Signed)
Anesthesia Review:  PCP: Marylene Buerger LOV 06/15/23  Cardiologist : Croituri- LOV 03/30/23  Has appt on 06/29/23 for preop clearance  Pulm- DR Marchelle Gearing- LOV 06/05/23  Device Check- 04/28/23  Chest x-ray : CT Chest- 06/01/23  EKG : Echo : 10/06/22  Stress test: Cardiac Cath :  Activity level:  Sleep Study/ CPAP : Fasting Blood Sugar :      / Checks Blood Sugar -- times a day:   Blood Thinner/ Instructions /Last Dose: ASA / Instructions/ Last Dose :    Eliquis-   06/21/23- cbc and CMP

## 2023-06-28 ENCOUNTER — Encounter: Payer: Self-pay | Admitting: Family Medicine

## 2023-06-28 ENCOUNTER — Ambulatory Visit: Payer: Medicare Other | Admitting: Family Medicine

## 2023-06-28 ENCOUNTER — Ambulatory Visit: Payer: Medicare Other | Attending: Family Medicine | Admitting: Physical Therapy

## 2023-06-28 VITALS — BP 138/84 | HR 87 | Temp 96.4°F | Ht 63.0 in | Wt 149.0 lb

## 2023-06-28 DIAGNOSIS — I83019 Varicose veins of right lower extremity with ulcer of unspecified site: Secondary | ICD-10-CM

## 2023-06-28 DIAGNOSIS — R918 Other nonspecific abnormal finding of lung field: Secondary | ICD-10-CM

## 2023-06-28 DIAGNOSIS — R262 Difficulty in walking, not elsewhere classified: Secondary | ICD-10-CM | POA: Insufficient documentation

## 2023-06-28 DIAGNOSIS — I483 Typical atrial flutter: Secondary | ICD-10-CM

## 2023-06-28 DIAGNOSIS — K219 Gastro-esophageal reflux disease without esophagitis: Secondary | ICD-10-CM

## 2023-06-28 DIAGNOSIS — R269 Unspecified abnormalities of gait and mobility: Secondary | ICD-10-CM | POA: Diagnosis present

## 2023-06-28 DIAGNOSIS — R2689 Other abnormalities of gait and mobility: Secondary | ICD-10-CM | POA: Diagnosis present

## 2023-06-28 DIAGNOSIS — Z7901 Long term (current) use of anticoagulants: Secondary | ICD-10-CM

## 2023-06-28 DIAGNOSIS — J449 Chronic obstructive pulmonary disease, unspecified: Secondary | ICD-10-CM

## 2023-06-28 DIAGNOSIS — I10 Essential (primary) hypertension: Secondary | ICD-10-CM | POA: Diagnosis not present

## 2023-06-28 DIAGNOSIS — G8929 Other chronic pain: Secondary | ICD-10-CM

## 2023-06-28 DIAGNOSIS — M810 Age-related osteoporosis without current pathological fracture: Secondary | ICD-10-CM

## 2023-06-28 DIAGNOSIS — Z0000 Encounter for general adult medical examination without abnormal findings: Secondary | ICD-10-CM

## 2023-06-28 DIAGNOSIS — D539 Nutritional anemia, unspecified: Secondary | ICD-10-CM

## 2023-06-28 DIAGNOSIS — Z7189 Other specified counseling: Secondary | ICD-10-CM

## 2023-06-28 DIAGNOSIS — M545 Low back pain, unspecified: Secondary | ICD-10-CM | POA: Insufficient documentation

## 2023-06-28 DIAGNOSIS — Z952 Presence of prosthetic heart valve: Secondary | ICD-10-CM

## 2023-06-28 DIAGNOSIS — L89600 Pressure ulcer of unspecified heel, unstageable: Secondary | ICD-10-CM

## 2023-06-28 DIAGNOSIS — E785 Hyperlipidemia, unspecified: Secondary | ICD-10-CM

## 2023-06-28 DIAGNOSIS — Z923 Personal history of irradiation: Secondary | ICD-10-CM

## 2023-06-28 DIAGNOSIS — M6281 Muscle weakness (generalized): Secondary | ICD-10-CM | POA: Diagnosis present

## 2023-06-28 DIAGNOSIS — I7121 Aneurysm of the ascending aorta, without rupture: Secondary | ICD-10-CM

## 2023-06-28 DIAGNOSIS — M4810 Ankylosing hyperostosis [Forestier], site unspecified: Secondary | ICD-10-CM

## 2023-06-28 DIAGNOSIS — H548 Legal blindness, as defined in USA: Secondary | ICD-10-CM

## 2023-06-28 DIAGNOSIS — E559 Vitamin D deficiency, unspecified: Secondary | ICD-10-CM

## 2023-06-28 DIAGNOSIS — Z85048 Personal history of other malignant neoplasm of rectum, rectosigmoid junction, and anus: Secondary | ICD-10-CM

## 2023-06-28 DIAGNOSIS — L97909 Non-pressure chronic ulcer of unspecified part of unspecified lower leg with unspecified severity: Secondary | ICD-10-CM

## 2023-06-28 DIAGNOSIS — I83029 Varicose veins of left lower extremity with ulcer of unspecified site: Secondary | ICD-10-CM

## 2023-06-28 DIAGNOSIS — F4323 Adjustment disorder with mixed anxiety and depressed mood: Secondary | ICD-10-CM

## 2023-06-28 DIAGNOSIS — N3946 Mixed incontinence: Secondary | ICD-10-CM

## 2023-06-28 DIAGNOSIS — D696 Thrombocytopenia, unspecified: Secondary | ICD-10-CM

## 2023-06-28 DIAGNOSIS — I4819 Other persistent atrial fibrillation: Secondary | ICD-10-CM

## 2023-06-28 DIAGNOSIS — E538 Deficiency of other specified B group vitamins: Secondary | ICD-10-CM

## 2023-06-28 DIAGNOSIS — I5032 Chronic diastolic (congestive) heart failure: Secondary | ICD-10-CM

## 2023-06-28 MED ORDER — ALENDRONATE SODIUM 70 MG PO TABS: 70.0000 mg | ORAL_TABLET | ORAL | 11 refills | Status: DC

## 2023-06-28 NOTE — Assessment & Plan Note (Signed)
Previously discussed.

## 2023-06-28 NOTE — Assessment & Plan Note (Signed)
Preventative protocols reviewed and updated unless pt declined. Discussed healthy diet and lifestyle.  

## 2023-06-28 NOTE — Assessment & Plan Note (Signed)

## 2023-06-28 NOTE — Assessment & Plan Note (Addendum)
Discussed options including side effects and adverse effects of meds.  Start fosamax weekly.

## 2023-06-28 NOTE — Progress Notes (Unsigned)
Ph: [phone] Fax: [phone]   Patient ID: [name], female    DOB: [birth year], 77 y.o.   MRN: [id number]  This visit was conducted in person.  BP 138/84   Pulse 87   Temp (!) 96.4 F (35.8 C)   Ht 5\' 3"  (1.6 m)   Wt 149 lb (67.6 kg)   SpO2 94%   BMI 26.39 kg/m    CC: CPE/AMW Subjective:   HPI: [name] is a 77 y.o. female presenting on 06/28/2023 for Annual Exam (Pt is fasting.)   Did not see health advisor this year.   Hearing Screening   500Hz  1000Hz  2000Hz  3000Hz  4000Hz   Right ear 40 40 40 40 40  Left ear 40 40 40 40 40   Vision Screening   Right eye Left eye Both eyes  Without correction 20/30 20/200 20/20  With correction      Chronic L eye vision loss Flowsheet Row Office Visit from 06/28/2023 in [facility] [place]  PHQ-2 Total Score 2          06/28/2023   10:02 AM 06/22/2023    2:39 PM 06/15/2023   12:07 PM 06/05/2023    3:44 PM 04/24/2023    2:02 PM  Fall Risk   Falls in the past year? 1 1 1 1 1   Number falls in past yr: 1 1 1  0 1  Injury with Fall?  1  0 0  Risk for fall due to :    History of fall(s)   Follow up Falls evaluation completed   Falls evaluation completed    She has upcoming appt this afternoon for outpatient PT.  Sees [facility] - they are monitoring R sided ureter stone developing hydronephrosis. Planned ureteroscopy next week followed by temporary stent placement.   L anterior shin ulcer - wound culture grew pseudomonas, treated with ciprofloxacin 500mg  BID x5d course. She was very nauseated while on medicine. Wound has continued healing well.  New R shin ulcer developed 1 wk ago - continues bleeding.   Persistent chronic scab to left lateral malleolus as well as pressure wounds to bilateral heels, ABIs pending (scheduled for 07/13/2023).   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 Regularly sees Dr Marchelle Gearing for  COPD as well. Planned rpt spirometry in a few months.   OP - see below.   Preventative: Colonoscopy 05/2021 - diverticulosis and patent colon anastomosis with some edema/narrowing, rpt 5 yrs Christella Hartigan) Well woman - normal paps in past, aged out of cervical cancer screening.  [facility]2023 [facility]  DEXA - T -1.7 hip (06/2018).  DEXA 03/2023 - T -3.6 distal radius, -2.2 LFN.  Doesn't drink milk. Eats yogurt regularly. She takes calcium and vitamin D regularly. Back limits weight bearing exercise. Start fosamax 06/2023 Lung cancer screening - not eligible  Flu shot yearly  COVID vaccine - Pfizer 10/2019, 11/2019, Moderna booster 08/2020, 02/2021, bivalent 06/2021 - planning to repeat seasonally.   Pneumovax 2009, 06/2013, prevnar-13 05/2014  Td 2007  RSV vaccine 07/2022. zostavax - 09/2014  shingrix - 06/2018, 08/2018  Advanced directives: scanned and in chart 10/2014. Has living will at home. HCPOA is husband (deceased) then son. Has updated at home, asked to bring [place] copy.  Seat belt use discussed.  Sunscreen use discussed. No changing moles on skin.  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 Husband died 04-13-2015 MVA. 1 cat.  2 sons; 1 grandchild. One son died of MI 69 Married September 12, 1965  Retired-[employer], volunteers at [employer], to start at [employer] in [place] Activity: pulm rehab  Diet: some water, fruits/vegetables daily, yogurt daily     Relevant past medical, surgical, family and social history reviewed and updated as indicated. Interim medical history since our last visit reviewed. Allergies and medications reviewed and updated. Outpatient Medications Prior to Visit  Medication Sig Dispense Refill   acetaminophen (TYLENOL) 500  MG tablet Take 2 tablets (1,000 mg total) by mouth daily.     acidophilus (RISAQUAD) CAPS capsule Take 1 capsule by mouth daily.     albuterol (VENTOLIN HFA) 108 (90 Base) MCG/ACT inhaler Inhale 2 puffs into the lungs every 6 (six) hours as needed for wheezing. 18 g 6   ascorbic acid (VITAMIN C) 500 MG tablet Take 1 tablet (500 mg total) by mouth daily.     Biotin 1000 MCG tablet Take 1,000 mcg by mouth daily.     buPROPion (WELLBUTRIN SR) 150 MG 12 hr tablet Take 1 tablet (150 mg total) by mouth every morning. 90 tablet 1   calcium carbonate (OSCAL) 1500 (600 Ca) MG TABS tablet Take 600 mg of elemental calcium by mouth daily.     carboxymethylcellulose (REFRESH PLUS) 0.5 % SOLN Place 1 drop into both eyes 3 (three) times daily as needed (dry eyes).     cephALEXin (KEFLEX) 250 MG capsule Take 250 mg by mouth daily.     cholecalciferol (VITAMIN D3) 25 MCG (1000 UNIT) tablet Take 1,000 Units by mouth daily.     docusate sodium (COLACE) 100 MG capsule Take 200 mg by mouth every evening.     ELIQUIS 5 MG TABS tablet TAKE 1 TABLET(5 MG) BY MOUTH TWICE DAILY 180 tablet 1   Fluticasone-Umeclidin-Vilant (TRELEGY ELLIPTA) 200-62.5-25 MCG/ACT AEPB Inhale 1 puff into the lungs daily. 60 each 11   furosemide (LASIX) 40 MG tablet Take 1 tablet (40 mg total) by mouth daily. May take an extra tablet daily as needed for swelling. 135 tablet 2   gabapentin (NEURONTIN) 100 MG capsule Take 1 capsule (100 mg total) by mouth 2 (two) times daily as needed (back pain - for daytime use). 60 capsule 1   gabapentin (NEURONTIN) 300 MG capsule Take 300 mg by mouth at bedtime.     hydrocortisone (ANUSOL-HC) 2.5 % rectal cream Place 1 Application rectally 2 (two) times daily. 30 g 0   ipratropium-albuterol (DUONEB) 0.5-2.5 (3) MG/3ML SOLN Take 3 mLs by nebulization every 6 (six) hours as needed. 360 mL 0   lovastatin (MEVACOR) 40 MG tablet TAKE 1 TABLET(40 MG) BY MOUTH AT BEDTIME 90 tablet 1   montelukast (SINGULAIR) 10 MG  tablet TAKE 1 TABLET(10 MG) BY MOUTH AT BEDTIME 90 tablet 1   Multiple Vitamins-Minerals (PRESERVISION AREDS 2 PO) Take 2 capsules by mouth every evening.      Phenazopyrid-Cranbry-C-Probiot (AZO URINARY TRACT SUPPORT PO) Take 1 capsule by mouth daily.     polyethylene glycol (MIRALAX / GLYCOLAX) packet Take 17 g by mouth daily. 14 each 0   potassium chloride (KLOR-CON) 10 MEQ tablet TAKE 2 TABLETS(20 MEQ) BY MOUTH DAILY 180 tablet 1   psyllium (REGULOID) 0.52 g capsule Take  0.52 g by mouth daily.     traMADol (ULTRAM) 50 MG tablet TAKE 1 TABLET(50 MG) BY MOUTH TWICE DAILY AS NEEDED 60 tablet 3   traZODone (DESYREL) 50 MG tablet TAKE 1/2 TO 1 TABLET(25 TO 50 MG) BY MOUTH AT BEDTIME 30 tablet 6   Vibegron (GEMTESA) 75 MG TABS Take 75 mg by mouth daily.     vitamin B-12 (CYANOCOBALAMIN) 500 MCG tablet Take 1 tablet (500 mcg total) by mouth 2 (two) times a week.     Zinc 50 MG CAPS Take 1 capsule (50 mg total) by mouth daily.  0   Facility-Administered Medications Prior to Visit  Medication Dose Route Frequency Provider Last Rate Last Admin   ondansetron (ZOFRAN) 4 mg in sodium chloride 0.9 % 50 mL IVPB  4 mg Intravenous Q6H PRN Barnett Abu, MD         Per HPI unless specifically indicated in ROS section below Review of Systems  Constitutional:  Negative for activity change, appetite change, chills, fatigue, fever and unexpected weight change.  HENT:  Positive for sneezing (spells). Negative for hearing loss.   Eyes:  Negative for visual disturbance.  Respiratory:  Negative for cough, chest tightness, shortness of breath and wheezing.   Cardiovascular:  Positive for leg swelling (chronic). Negative for chest pain and palpitations.  Gastrointestinal:  Negative for abdominal distention, abdominal pain, blood in stool, constipation, diarrhea, nausea and vomiting.  Genitourinary:  Negative for difficulty urinating and hematuria.  Musculoskeletal:  Negative for arthralgias, myalgias and neck  pain.  Skin:  Negative for rash.  Neurological:  Negative for dizziness, seizures, syncope and headaches.  Hematological:  Negative for adenopathy. Bruises/bleeds easily.  Psychiatric/Behavioral:  Negative for dysphoric mood. The patient is not nervous/anxious.     Objective:  BP 138/84   Pulse 87   Temp (!) 96.4 F (35.8 C)   Ht 5\' 3"  (1.6 m)   Wt 149 lb (67.6 kg)   SpO2 94%   BMI 26.39 kg/m   Wt Readings from Last 3 Encounters:  06/29/23 148 lb 13 oz (67.5 kg)  06/28/23 149 lb (67.6 kg)  06/15/23 150 lb 2 oz (68.1 kg)      Physical Exam Vitals and nursing note reviewed.  Constitutional:      Appearance: Normal appearance. She is not ill-appearing.  HENT:     Head: Normocephalic and atraumatic.     Right Ear: Tympanic membrane, ear canal and external ear normal. Decreased hearing noted. There is no impacted cerumen.     Left Ear: Tympanic membrane, ear canal and external ear normal. Decreased hearing noted. There is no impacted cerumen.     Ears:     Comments: Wears hearing aides    Mouth/Throat:     Mouth: Mucous membranes are moist.     Pharynx: Oropharynx is clear. No oropharyngeal exudate or posterior oropharyngeal erythema.  Eyes:     General:        Right eye: No discharge.        Left eye: No discharge.     Extraocular Movements: Extraocular movements intact.     Conjunctiva/sclera: Conjunctivae normal.     Pupils: Pupils are equal, round, and reactive to light.  Neck:     Thyroid: No thyroid mass or thyromegaly.     Vascular: No carotid bruit.  Cardiovascular:     Rate and Rhythm: Normal rate and regular rhythm.     Pulses: Normal pulses.     Heart sounds: Normal  heart sounds. No murmur heard. Pulmonary:     Effort: Pulmonary effort is normal. No respiratory distress.     Breath sounds: Normal breath sounds. No wheezing, rhonchi or rales.  Abdominal:     General: Bowel sounds are normal. There is no distension.     Palpations: Abdomen is soft. There is  no mass.     Tenderness: There is no abdominal tenderness. There is no guarding or rebound.     Hernia: No hernia is present.  Musculoskeletal:     Cervical back: Normal range of motion and neck supple. No rigidity.     Right lower leg: No edema.     Left lower leg: No edema.     Comments: Chronic scoliosis  Lymphadenopathy:     Cervical: No cervical adenopathy.  Skin:    General: Skin is warm and dry.     Findings: No rash.  Neurological:     General: No focal deficit present.     Mental Status: She is alert. Mental status is at baseline.  Psychiatric:        Mood and Affect: Mood normal.        Behavior: Behavior normal.       Results for orders placed or performed in visit on 06/22/23  Pathologist smear review  Result Value Ref Range   Path Review     *Note: Due to a large number of results and/or encounters for the requested time period, some results have not been displayed. A complete set of results can be found in Results Review.   Lab Results  Component Value Date   CHOL 152 06/21/2023   HDL 67.70 06/21/2023   LDLCALC 67 06/21/2023   LDLDIRECT 111.0 06/26/2015   TRIG 85.0 06/21/2023   CHOLHDL 2 06/21/2023    Lab Results  Component Value Date   NA 137 06/29/2023   CL 101 06/29/2023   K 3.8 06/29/2023   CO2 28 06/29/2023   BUN 18 06/29/2023   CREATININE 0.80 06/29/2023   GFR 63.14 06/21/2023   CALCIUM 8.8 (L) 06/29/2023   PHOS 3.7 06/21/2023   ALBUMIN 3.4 (L) 06/21/2023   GLUCOSE 84 06/29/2023    Lab Results  Component Value Date   ALT 11 06/21/2023   AST 28 06/21/2023   ALKPHOS 65 06/21/2023   BILITOT 0.8 06/21/2023    Lab Results  Component Value Date   WBC 4.6 06/29/2023   HGB 10.2 (L) 06/29/2023   HCT 32.9 (L) 06/29/2023   MCV 104.4 (H) 06/29/2023   PLT 86 (L) 06/29/2023   Lab Results  Component Value Date   VITAMINB12 633 06/21/2023   Lab Results  Component Value Date   VD25OH 60.82 06/21/2023   Lab Results  Component Value Date    IRON 138 04/23/2019   TIBC 323 09/21/2011   FERRITIN 88.0 04/23/2019    Assessment & Plan:   Problem List Items Addressed This Visit     Medicare annual wellness visit, subsequent - Primary (Chronic)    I have personally reviewed the Medicare Annual Wellness questionnaire and have noted 1. The patient's medical and social history 2. Their use of alcohol, tobacco or illicit drugs 3. Their current medications and supplements 4. The patient's functional ability including ADL's, fall risks, home safety risks and hearing or visual impairment. Cognitive function has been assessed and addressed as indicated.  5. Diet and physical activity 6. Evidence for depression or mood disorders The patients weight, height, BMI have been recorded  in the chart. I have made referrals, counseling and provided education to the patient based on review of the above and I have provided the pt with a written personalized care plan for preventive services. Provider list updated.. See scanned questionairre as needed for further documentation. Reviewed preventative protocols and updated unless pt declined.       Health maintenance examination (Chronic)    Preventative protocols reviewed and updated unless pt declined. Discussed healthy diet and lifestyle.       Advanced care planning/counseling discussion (Chronic)    Previously discussed      HLD (hyperlipidemia)    Chronic, stable period on lovastatin - continue this.  The 10-year ASCVD risk score (Arnett DK, et al., 2019) is: 25.7%   Values used to calculate the score:     Age: 57 years     Sex: Female     Is Non-Hispanic African American: No     Diabetic: No     Tobacco smoker: No     Systolic Blood Pressure: 119 mmHg     Is BP treated: Yes     HDL Cholesterol: 67.7 mg/dL     Total Cholesterol: 152 mg/dL       Essential hypertension, benign    Chronic, stable - continue current regimen       COPD (chronic obstructive pulmonary disease)     Followed by pulm on Trelegy, with PRN albuterol.       GERD (gastroesophageal reflux disease)    Stable period off PPI.       Chronic lower back pain   History of rectal cancer    S/p reassuring colonoscopy 2022.       Macrocytic anemia    Chronic macrocytosis, new anemia over the past year. Some evidence of hemolysis.  Recent b12, latest folate normal.  Will ask her to return for rpt labs including LDH, iron panel.  Check UA and iFOB for further evaluation.      History of radiation therapy   S/P aortic and mitral valve bioprostheses    Continues eliquis.       Chronic diastolic heart failure    On daily lasix followed by cardiology.       Adjustment disorder with mixed anxiety and depressed mood    Stable period off medication       Osteoporosis    Discussed options including side effects and adverse effects of meds, specifically atypical hip fracture/osteonecrosis of jaw. No upcoming planned dental work.  Start fosamax weekly. Alendronate handout provided.       Relevant Medications   alendronate (FOSAMAX) 70 MG tablet   Vitamin B12 deficiency    Continue b12 biweekly oral replacement.       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  Recent periph smear reassuring.  No known h/o cirrhosis. ?ITP. Continue to monitor. Consider updated iron panel.  Persistent atrial fibrillation    Continues eliquis.       Ascending aortic aneurysm (HCC)    Latest imaged 09/2022 by echocardiogram.      Legally  blind in left eye   Venous ulcer of leg (HCC)    Wound culture grew pseudomonas s/p cipro course. Continues healing.       Arterial leg ulcer (HCC)    Presumed, ABI pending      Pressure sore on heel    To see wound clinic for recommendations on bilateral heel pressure skin changes        Meds ordered this encounter  Medications   alendronate (FOSAMAX) 70 MG tablet    Sig: Take 1 tablet (70 mg total) by mouth every 7 (seven) days. Take with a full glass of water on an empty stomach.    Dispense:  4 tablet    Refill:  11    No orders of the defined types were placed in this encounter.   Patient Instructions  You may get flu and covid shots at local pharmacy.  Start fosamax 1 tablet once weekly. Take in am on empty stomach with large glass of water, must stay upright for 30-60 min after taking. See handout provided.  Return in 3 months for follow up visit   Follow up plan: Return in about 3 months (around 09/27/2023) for follow up visit.  [name], MD

## 2023-06-28 NOTE — Patient Instructions (Addendum)
You may get flu and covid shots at local pharmacy.  Start fosamax 1 tablet once weekly. Take in am on empty stomach with large glass of water, must stay upright for 30-60 min after taking. See handout provided.  Return in 3 months for follow up visit

## 2023-06-28 NOTE — Assessment & Plan Note (Signed)
Stable period off PPI.

## 2023-06-29 ENCOUNTER — Encounter: Payer: Self-pay | Admitting: Cardiovascular Disease

## 2023-06-29 ENCOUNTER — Telehealth: Payer: Self-pay | Admitting: Family Medicine

## 2023-06-29 ENCOUNTER — Other Ambulatory Visit: Payer: Self-pay

## 2023-06-29 ENCOUNTER — Ambulatory Visit: Payer: Medicare Other | Attending: Internal Medicine | Admitting: Student

## 2023-06-29 ENCOUNTER — Encounter (HOSPITAL_COMMUNITY): Payer: Self-pay

## 2023-06-29 ENCOUNTER — Encounter (HOSPITAL_COMMUNITY)
Admission: RE | Admit: 2023-06-29 | Discharge: 2023-06-29 | Disposition: A | Discharge status: Home / Self Care | Payer: Medicare Other | Source: Ambulatory Visit | Attending: Urology | Admitting: Urology

## 2023-06-29 ENCOUNTER — Encounter: Payer: Self-pay | Admitting: Family Medicine

## 2023-06-29 ENCOUNTER — Encounter: Payer: Self-pay | Admitting: Physical Therapy

## 2023-06-29 VITALS — BP 119/76 | HR 74 | Temp 98.2°F | Resp 16 | Ht 63.0 in | Wt 148.8 lb

## 2023-06-29 DIAGNOSIS — Z79899 Other long term (current) drug therapy: Secondary | ICD-10-CM | POA: Insufficient documentation

## 2023-06-29 DIAGNOSIS — Z87891 Personal history of nicotine dependence: Secondary | ICD-10-CM | POA: Diagnosis not present

## 2023-06-29 DIAGNOSIS — D539 Nutritional anemia, unspecified: Secondary | ICD-10-CM

## 2023-06-29 DIAGNOSIS — Z9889 Other specified postprocedural states: Secondary | ICD-10-CM | POA: Insufficient documentation

## 2023-06-29 DIAGNOSIS — N200 Calculus of kidney: Secondary | ICD-10-CM | POA: Diagnosis not present

## 2023-06-29 DIAGNOSIS — Z7901 Long term (current) use of anticoagulants: Secondary | ICD-10-CM | POA: Insufficient documentation

## 2023-06-29 DIAGNOSIS — Z01810 Encounter for preprocedural cardiovascular examination: Secondary | ICD-10-CM | POA: Diagnosis not present

## 2023-06-29 DIAGNOSIS — Z01818 Encounter for other preprocedural examination: Secondary | ICD-10-CM | POA: Diagnosis present

## 2023-06-29 DIAGNOSIS — J449 Chronic obstructive pulmonary disease, unspecified: Secondary | ICD-10-CM | POA: Insufficient documentation

## 2023-06-29 DIAGNOSIS — Z952 Presence of prosthetic heart valve: Secondary | ICD-10-CM | POA: Diagnosis not present

## 2023-06-29 DIAGNOSIS — Z01812 Encounter for preprocedural laboratory examination: Secondary | ICD-10-CM | POA: Insufficient documentation

## 2023-06-29 DIAGNOSIS — D696 Thrombocytopenia, unspecified: Secondary | ICD-10-CM

## 2023-06-29 HISTORY — DX: Localized edema: R60.0

## 2023-06-29 HISTORY — DX: Venous insufficiency (chronic) (peripheral): I87.2

## 2023-06-29 HISTORY — DX: Personal history of urinary calculi: Z87.442

## 2023-06-29 HISTORY — DX: Depression, unspecified: F32.A

## 2023-06-29 LAB — BASIC METABOLIC PANEL
Anion gap: 8 (ref 5–15)
BUN: 18 mg/dL (ref 8–23)
CO2: 28 mmol/L (ref 22–32)
Calcium: 8.8 mg/dL — ABNORMAL LOW (ref 8.9–10.3)
Chloride: 101 mmol/L (ref 98–111)
Creatinine, Ser: 0.8 mg/dL (ref 0.44–1.00)
GFR, Estimated: >60 mL/min (ref >60)
Glucose, Bld: 84 mg/dL (ref 70–99)
Potassium: 3.8 mmol/L (ref 3.5–5.1)
Sodium: 137 mmol/L (ref 135–145)

## 2023-06-29 LAB — CBC
HCT: 32.9 % — ABNORMAL LOW (ref 36.0–46.0)
Hemoglobin: 10.2 g/dL — ABNORMAL LOW (ref 12.0–15.0)
MCH: 32.4 pg (ref 26.0–34.0)
MCHC: 31 g/dL (ref 30.0–36.0)
MCV: 104.4 fL — ABNORMAL HIGH (ref 80.0–100.0)
Platelets: 86 10*3/uL — ABNORMAL LOW (ref 150–400)
RBC: 3.15 MIL/uL — ABNORMAL LOW (ref 3.87–5.11)
RDW: 14.9 % (ref 11.5–15.5)
WBC: 4.6 10*3/uL (ref 4.0–10.5)
nRBC: 0 % (ref 0.0–0.2)

## 2023-06-29 NOTE — Progress Notes (Signed)
PERIOPERATIVE PRESCRIPTION FOR IMPLANTED CARDIAC DEVICE PROGRAMMING  Patient Information: Name:  Cadey Tesfay  DOB:  02/10/1946  MRN:  [id number]    Planned Procedure:  Cystoscopy , right retrograde pyelogram, right ureteroscopy, with holmium laser and stent placement  Surgeon:  Dr  Modena Slater  Date of Procedure:  07/03/23  Cautery will be used.  Position during surgery:   Please send documentation back to:  [facility] (Fax # [phone])  Device Information:  Clinic EP Physician:  Dr. Melanee Spry Croitoru   Device Type:  Pacemaker Manufacturer and Phone #:  [employer]ott: [phone] Pacemaker Dependent?:  Yes.   Date of Last Device Check:  04/28/2023 Normal Device Function?:  Yes.    Electrophysiologist's Recommendations:  Have magnet available. Provide continuous ECG monitoring when magnet is used or reprogramming is to be performed.  Procedure may interfere with device function.  Magnet should be placed over device during procedure.  Per Device Clinic Standing Orders, Lenor Coffin, RN  8:36 AM 06/29/2023

## 2023-06-29 NOTE — Assessment & Plan Note (Signed)
Chronic, stable period on lovastatin - continue this.  The 10-year ASCVD risk score (Arnett DK, et al., 2019) is: 25.7%   Values used to calculate the score:     Age: 77 years     Sex: Female     Is Non-Hispanic African American: No     Diabetic: No     Tobacco smoker: No     Systolic Blood Pressure: 119 mmHg     Is BP treated: Yes     HDL Cholesterol: 67.7 mg/dL     Total Cholesterol: 152 mg/dL

## 2023-06-29 NOTE — Assessment & Plan Note (Signed)
Presumed, ABI pending

## 2023-06-29 NOTE — Telephone Encounter (Signed)
Plz notify - I'd like to further evaluate worsening anemia with further labwork as well as urinalysis and iFOB - labs ordered. Plz have her come in at her convenience for further labwork.

## 2023-06-29 NOTE — Assessment & Plan Note (Signed)
On daily lasix followed by cardiology.

## 2023-06-29 NOTE — Assessment & Plan Note (Signed)
Continue b12 biweekly oral replacement.

## 2023-06-29 NOTE — Assessment & Plan Note (Signed)
Now on eliquis in place of coumadin

## 2023-06-29 NOTE — Assessment & Plan Note (Signed)
Stable period off medication.

## 2023-06-29 NOTE — Assessment & Plan Note (Signed)
Continues eliquis.

## 2023-06-29 NOTE — Assessment & Plan Note (Signed)
Chronic, stable continue current regimen.

## 2023-06-29 NOTE — Assessment & Plan Note (Signed)
Chronic macrocytosis, new anemia over the past year. Some evidence of hemolysis.  Recent b12, latest folate normal.  Will ask her to return for rpt labs including LDH, iron panel.  Check UA and iFOB for further evaluation.

## 2023-06-29 NOTE — Assessment & Plan Note (Signed)
Latest imaged 09/2022 by echocardiogram.

## 2023-06-29 NOTE — Assessment & Plan Note (Signed)
Continue daily vit D supplementation

## 2023-06-29 NOTE — Assessment & Plan Note (Signed)
To see wound clinic for recommendations on bilateral heel pressure skin changes

## 2023-06-29 NOTE — Assessment & Plan Note (Signed)
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

## 2023-06-29 NOTE — Assessment & Plan Note (Addendum)
Chronically low, on eliquis, not aspirin.  Recent periph smear reassuring.  No known h/o cirrhosis. ?ITP. Continue to monitor. Consider updated iron panel.

## 2023-06-29 NOTE — Assessment & Plan Note (Signed)
Continues eliquis.  

## 2023-06-29 NOTE — Assessment & Plan Note (Signed)
Wound culture grew pseudomonas s/p cipro course. Continues healing.

## 2023-06-29 NOTE — Assessment & Plan Note (Signed)
Followed by pulm on Trelegy, with PRN albuterol.

## 2023-06-29 NOTE — Telephone Encounter (Signed)
Spoke with pt relaying Dr Timoteo Expose message. Pt verbalizes understanding and scheduled lab visit on 06/30/23 at 8:45.

## 2023-06-29 NOTE — Assessment & Plan Note (Signed)
Sees urology s/p InterStim now on Gemtesa

## 2023-06-29 NOTE — Assessment & Plan Note (Signed)
Continue long term eliquis

## 2023-06-29 NOTE — Assessment & Plan Note (Signed)
S/p reassuring colonoscopy 2022.

## 2023-06-29 NOTE — Progress Notes (Signed)
Virtual Visit via Telephone Note   Because of [name]'s co-morbid illnesses, she is at least at moderate risk for complications without adequate follow up.  This format is felt to be most appropriate for this patient at this time.  The patient did not have access to video technology/had technical difficulties with video requiring transitioning to audio format only (telephone).  All issues noted in this document were discussed and addressed.  No physical exam could be performed with this format.  Please refer to the patient's chart for her consent to telehealth for [facility].  Evaluation Performed:  Preoperative cardiovascular risk assessment _____________   Date:  06/29/2023   Patient ID:  [name], DOB 06-13-1946, MRN [id number] Patient Location:  Home Provider location:   Office  Primary Care Provider:  [name], MD Primary Cardiologist:  Thurmon Fair, MD  Chief Complaint / Patient Profile   77 y.o. y/o female with a h/o PAF/a-flutter on anticoagulation, chronic diastolic heart failure, heart block s/p PPM, ascending aortic aneurysm, carotid stenosis, MVR with MV stenosis, AVR, pulmonary hypertension, COPD, hypertension, hyperlipidemia, GERD, CKD who is pending ureteroscopy for kidney stones by Dr. Alvester Morin and presents today for telephonic preoperative cardiovascular risk assessment.  History of Present Illness    [name] is a 77 y.o. female who presents via audio/video conferencing for a telehealth visit today.  Pt was last seen in cardiology clinic on 03/30/2023 by Dr. Royann Shivers.  At that time Valora Amaiyah Marinello was stable from a cardiac standpoint.  The patient is now pending procedure as outlined above. Since her last visit, she is doing well. Patient denies shortness of breath, dyspnea on exertion, lower extremity edema, orthopnea or PND. No chest pain, pressure, or tightness. No palpitations.  Activity is limited by back  pain. She is independent with all ADLs and light to moderate household activities.   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 Known DISH with thoracic spine fusion and moderate kyphosis, h/o L spine fusion L2-5   Adjustment disorder with depressed mood 11/04/2014   76 yo son died MI 10-15-2014 Husband died car accident 2015-04-16   Anemia    Anticoagulant long-term use 06/14/2019   She takes warfarin for afib/flutter with CHADS2VASc score of 4 (age, sex, CHF, HTN), s/p 2 bioprosthetic valve replacements Coumadin changed to eliquis 2022 per cardiology   Aortic atherosclerosis 03/03/2022   Noted on 01/2022 chest CT   Arthritis    Arthropathy of spinal facet joint 03/08/2018   Ascending aortic aneurysm 03/02/2021   4.1cm on CT 01/2021 rec yearly monitoring   Bilateral hip pain 09/08/2016   Cancer (HCC)    conon cancer 2012   CAP (community acquired pneumonia) 02/08/2016   Carotid stenosis 12/08/2020   Carotid US - 40-59% BICA stenosis (2015) Carotid US 12/2020 - 1-39% BICA stenosis, incidental 3.4cm structure behind L common carotid artery rec further imaging    Cataracts, bilateral    immature   Chest pain 08/25/2014   myoview low risk 02/2018 ([facility])   Chronic cholecystitis with calculus 01/21/2015   S/p cholecystectomy    Chronic diastolic heart failure 09/19/2014   Chronic insomnia    Chronic lower back pain 03/05/2008   Returned to Dr Danielle Dess -  translaminar ESI at L3/4. Myelogram showed significant left sided foraminal stenosis at L2/3 and L3/4 in setting of solid arthrodesis, planned DEXA and if stable osteopenia, considering anterolateral decompression with spacer.    CKD (chronic kidney disease) stage 2, GFR 60-89 ml/min 07/03/2015    Constipation    takes Miralax daily as needed   COPD (chronic obstructive pulmonary disease)    Albuterol inhaler daily as needed;Duoneb daily as needed;Spiriva daily   COVID-19 virus infection 11/11/2022   DDD (degenerative disc disease)    cervical - kyphosis with mod DD changes C5/6 and C6/7; lumbar - early DD at L2/3 (Elsner)   Depression    DISH (diffuse idiopathic skeletal hyperostosis) 05/14/2019   By MRI noted fusion of lower thoracic vertebrae - likely contributing to unsteadiness (Elsner)   Dyspnea    with exertion   Dyspnea on exertion 06/05/2015   myoview low risk 02/2018   Edema of both lower legs due to peripheral venous insufficiency    Essential hypertension, benign 03/24/2009   hx of not on nmeds currenlty and has been a while since on meds per pt   Heart murmur    Hematuria 06/17/2021   History of kidney stones    History of radiation therapy 05/30/11 to 07/07/11   rectum   History of rectal cancer 05/26/2011   Rectal cancer 2012, midrectum ypT3ypN0, s/p neoadj chemo&XRT, lap LAR with diverting loop ileostomy 08/2011, ileostomy takedown 03/2012 Discharged from oncology clinic 2017 Truett Perna)   History of shingles    History of vertebral fracture 11/26/2019   Old L1 vertebral compression fracture incidentally noted on imaging 2020    HLD (hyperlipidemia) 05/03/2007   Hyperlipemia    takes Lovastatin daily   Lacunar infarction    Left caudate   Left hip pain 07/29/2020   Legally blind in left eye    Lung nodule    RLL nodule-64mm stable 2006, April 2009, and June 2009   Lymphangioma 01/07/2021   Noted incidentally on carotid US 12/2020 CT neck - Likely benign lymphangioma 01/2021   Macrocytosis 03/27/2022   Folate and b12 checked 2023   Malaise and fatigue 01/22/2018   Meralgia paresthetica 12/13/2007   Mixed stress and urge urinary incontinence 03/16/2015   Failed oxybutynin IR/ER. Vesicare initially helped but then no longer helpful.  myrbetriq was not effective.   Saw urology (MacDiarmid) - UDS largely overactive bladder with mild-mod stress incontinence. Improved on abx course - maintained on [place].    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   Pulmonary infiltrates 04/23/2019   06/13/2018-CT super D chest without contrast- generally stable chronic lung disease with pleural parenchymal scarring and scattered  nodularity most of these nodules have been tree-in-bud in appearance likely postinfectious or inflammatory the dominant right lower lobe nodule seen on most recent study has resolved no new or enlarging nodules  04/22/2019-CT chest without contrast- waxing and waning per   Pulmonary nodule 02/23/2012   RLL nodule-30mm stable 2006, April 2009, and June 2009 Small pulm nodules Jan 2012 - > Oct 2012 without change   Rheumatic heart disease mitral stenosis    mod MS by echo 02/2014   Right leg swelling 06/11/2020   R venous US 06/2020 WNL: - No evidence of deep vein thrombosis in the lower extremity. No indirect evidence of obstruction proximal to the inguinal  ligament. - No cystic structure found in the popliteal fossa.   S/P aortic and mitral valve bioprostheses 12/08/2013   AVR 21 mm, MVR 25 mm-MagnaEase pericardia   S/P AVR (aortic valve replacement) 2015   bioprosthetic (Bartle)   S/P MVR (mitral valve replacement) 2015   bioprosthetic (Bartle)   Sepsis secondary to UTI (HCC) 08/22/2017   Severe aortic valve stenosis 10/01/2013   mild-mod by echo 02/2014   Skin lesion 01/03/2022   Small bowel obstruction, partial 08/2013   reolved without NGT placement.    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   Dr Amy [place]   Syncope 05/28/2018   Thrombocytopenia 11/04/2019   Typical atrial flutter 05/15/2019   Vitamin B12 deficiency 02/28/2015   Start B12 shots 02/2015    Vitamin D deficiency 10/23/2016   Past Surgical History:  Procedure Laterality Date   ANTERIOR LAT LUMBAR FUSION Left 07/17/2018   Procedure: Lumbar Two-Three Lumbar Three-Four Anterolateral decompression/interbody fusion with lateral plate fixation/Infuse;  Surgeon: Barnett Abu, MD;  Location: [facility] OR;  Service: Neurosurgery;  Laterality: Left;  Lumbar Two-Three Lumbar Three-Four Anterolateral decompression/interbody fusion with lateral plate fixation/Infuse   AORTIC VALVE REPLACEMENT N/A 12/12/2013   Procedure: AORTIC VALVE REPLACEMENT (AVR);  Surgeon: Alleen Borne, MD; Service: Open Heart Surgery   BACK SURGERY  2006, 2007   2006 SPACER, 2007 decompression and fusion L4/5   BOWEL RESECTION  04/02/2012   Procedure: SMALL BOWEL RESECTION;  Surgeon: [name], MD;  Location: [facility] ORS;  Service: General;  Laterality: N/A;   BRONCHIAL WASHINGS  03/24/2021   Procedure: BRONCHIAL WASHINGS;  Surgeon: [name], MD;  Location: [facility] ENDOSCOPY;  Service: Endoscopy;;   CARPAL TUNNEL RELEASE Bilateral    CATARACT EXTRACTION W/ INTRAOCULAR LENS IMPLANT Left 10/18/2016   CATARACT EXTRACTION W/ INTRAOCULAR LENS IMPLANT Right 12/13/2016   Dr. Dagoberto Ligas    CHOLECYSTECTOMY N/A 01/21/2015   chronic cholecystitis, [name], MD   COLON RESECTION  08/25/2011   Procedure: COLON RESECTION LAPAROSCOPIC;  Surgeon: [name], MD;  Location: [facility];  Service: General;  Laterality: N/A;  Laparoscopic Assisted Low Anterior Resection Diverting Ostomy and onQ pain pump   COLONOSCOPY  03/2013   1 polyp, rpt 3 yrs Christella Hartigan)   COLONOSCOPY  05/2021   diverticulosis and patent colon anastomosis with some edema/narrowing, rpt 5 yrs Christella Hartigan)   COLONOSCOPY WITH PROPOFOL N/A 06/09/2016   patent colo-colonic anastomosis, rpt 5 yrs Christella Hartigan)   EP IMPLANTABLE DEVICE N/A 06/16/2016   Procedure: Pacemaker Implant;  Surgeon: Will Jorja Loa, MD;  Location: [facility] IN[facility]ASIVE CV [facility];  Service: Cardiovascular;  Laterality: N/A;   FOOT SURGERY  left foot   hammer toe   ILEOSTOMY  08/25/2011   ILEOSTOMY CLOSURE  04/02/2012   Procedure: ILEOSTOMY TAKEDOWN;  Surgeon: [name], MD;  Location: [facility];  Service: General;  Laterality: N/A;   INTRAOPERATIVE TRANSESOPHAGEAL ECHOCARDIOGRAM N/A 12/12/2013   Procedure: INTRAOPERATIVE TRANSESOPHAGEAL ECHOCARDIOGRAM;  Surgeon: Alleen Borne, MD;  Location: [facility] OR;  Service: Open Heart Surgery;  Laterality: N/A;   LEFT AND RIGHT HEART CATHETERIZATION WITH CORONARY ANGIOGRAM N/A 11/27/2013   Procedure: LEFT AND RIGHT HEART CATHETERIZATION WITH CORONARY ANGIOGRAM;  Surgeon: Thurmon Fair, MD;  Location: [facility] CATH LAB;  Service: Cardiovascular;  Laterality: N/A;   MITRAL VALVE REPLACEMENT N/A 12/12/2013   Procedure: MITRAL VALVE (MV) REPLACEMENT OR REPAIR;  Surgeon: Alleen Borne, MD; Service: Open Heart Surgery   TEE WITHOUT CARDIOVERSION N/A 11/29/2013   Procedure: TRANSESOPHAGEAL ECHOCARDIOGRAM (TEE);  Surgeon: Quintella Reichert, MD;  Location[facility] ENDOSCOPY;  Service: Cardiovascular;  Laterality: N/A;   TEE WITHOUT CARDIOVERSION N/A 06/04/2019   Procedure: TRANSESOPHAGEAL ECHOCARDIOGRAM (TEE);  Surgeon: Thurmon Fair, MD;  Location:  [facility] ENDOSCOPY;  Service: Cardiovascular;  Laterality: N/A;   TONSILLECTOMY     VIDEO BRONCHOSCOPY N/A 03/24/2021   Procedure: VIDEO BRONCHOSCOPY WITHOUT FLUORO;  Surgeon: [name], MD;  Location: [facility] ENDOSCOPY;  Service: Endoscopy;  Laterality: N/A;  SMALL SCOPE,PREMEDICATE WITH NEBULIZED LIDOCAINE    Allergies  Allergies  Allergen Reactions   Codeine Nausea Only   Sulfa Antibiotics Nausea Only    Home Medications    Prior to Admission medications   Medication Sig Start Date End Date Taking? Authorizing Provider  acetaminophen (TYLENOL) 500 MG tablet Take 2 tablets (1,000 mg total) by mouth daily. 05/02/19   [name], MD  acidophilus (RISAQUAD) CAPS capsule Take 1 capsule by mouth daily.    [provider]  albuterol (VENTOLIN HFA) 108 (90 Base) MCG/ACT inhaler Inhale 2 puffs into the lungs every 6 (six) hours as needed for wheezing. 06/05/23   [name], MD  alendronate (FOSAMAX) 70 MG tablet Take 1 tablet (70 mg total) by mouth every 7 (seven) days. Take with a full glass of water on an empty stomach. 06/28/23   [name], MD  ascorbic acid (VITAMIN C) 500 MG tablet Take 1 tablet (500 mg total) by mouth daily. 10/05/22   [name], MD  Biotin 1000 MCG tablet Take 1,000 mcg by mouth daily.    [provider]  buPROPion (WELLBUTRIN SR) 150 MG 12 hr tablet Take 1 tablet (150 mg total) by mouth every morning. 02/10/23   [name], MD  calcium carbonate (OSCAL) 1500 (600 Ca) MG TABS tablet Take 600 mg of elemental calcium by mouth daily.    [provider]  carboxymethylcellulose (REFRESH PLUS) 0.5 % SOLN Place 1 drop into both eyes 3 (three) times daily as needed (dry eyes).    [provider]  cephALEXin (KEFLEX) 250 MG capsule Take 250 mg by mouth daily.    [provider]  cholecalciferol (VITAMIN D3) 25 MCG (1000 UNIT) tablet Take 1,000 Units by mouth daily.    [provider]  docusate  sodium (COLACE) 100 MG capsule Take 200 mg by mouth every evening.    [provider]  ELIQUIS 5 MG TABS tablet TAKE 1 TABLET(5 MG) BY MOUTH TWICE DAILY 02/13/23   Croitoru, Mihai, MD  Fluticasone-Umeclidin-Vilant (TRELEGY ELLIPTA) 200-62.5-25 MCG/ACT AEPB Inhale 1 puff into the lungs daily. 06/05/23   [name], MD  furosemide (LASIX) 40 MG tablet Take 1 tablet (40 mg total) by mouth daily. May take an extra tablet daily as needed for swelling. 07/21/22   Croitoru, Mihai, MD  gabapentin (NEURONTIN) 100 MG  capsule Take 1 capsule (100 mg total) by mouth 2 (two) times daily as needed (back pain - for daytime use). 04/24/23   [name], MD  gabapentin (NEURONTIN) 300 MG capsule Take 300 mg by mouth at bedtime.    [provider]  hydrocortisone (ANUSOL-HC) 2.5 % rectal cream Place 1 Application rectally 2 (two) times daily. 04/24/23   [name], MD  ipratropium-albuterol (DUONEB) 0.5-2.5 (3) MG/3ML SOLN Take 3 mLs by nebulization every 6 (six) hours as needed. 11/14/14   Pecola Lawless, MD  lovastatin (MEVACOR) 40 MG tablet TAKE 1 TABLET(40 MG) BY MOUTH AT BEDTIME 01/02/23   [name], MD  montelukast (SINGULAIR) 10 MG tablet TAKE 1 TABLET(10 MG) BY MOUTH AT BEDTIME 02/13/23   [name], MD  Multiple Vitamins-Minerals (PRESERVISION AREDS 2 PO) Take 2 capsules by mouth every evening.     [provider]  Phenazopyrid-Cranbry-C-Probiot (AZO URINARY TRACT SUPPORT PO) Take 1 capsule by mouth daily.    [provider]  polyethylene glycol (MIRALAX / GLYCOLAX) packet Take 17 g by mouth daily. 12/02/13   Dwana Melena, PA-C  potassium chloride (KLOR-CON) 10 MEQ tablet TAKE 2 TABLETS(20 MEQ) BY MOUTH DAILY 01/02/23   Croitoru, Mihai, MD  psyllium (REGULOID) 0.52 g capsule Take 0.52 g by mouth daily.    [provider]  traMADol (ULTRAM) 50 MG tablet TAKE 1 TABLET(50 MG) BY MOUTH TWICE DAILY AS NEEDED 02/14/23   [name], MD   traZODone (DESYREL) 50 MG tablet TAKE 1/2 TO 1 TABLET(25 TO 50 MG) BY MOUTH AT BEDTIME 06/27/23   [name], MD  Vibegron (GEMTESA) 75 MG TABS Take 75 mg by mouth daily.    [provider]  vitamin B-12 (CYANOCOBALAMIN) 500 MCG tablet Take 1 tablet (500 mcg total) by mouth 2 (two) times a week. 01/06/22   [name], MD  Zinc 50 MG CAPS Take 1 capsule (50 mg total) by mouth daily. 11/04/19   [name], MD    Physical Exam    Vital Signs:  Cherylynn Angelly Nungaray does not have vital signs available for review today.  Given telephonic nature of communication, physical exam is limited. AAOx3. NAD. Normal affect.  Speech and respirations are unlabored.  Accessory Clinical Findings    None  Assessment & Plan    Primary Cardiologist: Thurmon Fair, MD  Preoperative cardiovascular risk assessment. Ureteroscopy for kidney stones by Dr. Alvester Morin on 07/03/2023.   Chart reviewed as part of pre-operative protocol coverage. According to the RCRI, patient has a 0.9% risk of MACE. Patient reports activity equivalent to 4.73 METS (per [facility]ASI).   Given past medical history and time since last visit, based on ACC/AHA guidelines, Delmi Lorne Aronson would be at acceptable risk for the planned procedure without further cardiovascular testing.   Patient was advised that if she develops new symptoms prior to surgery to contact our office to arrange a follow-up appointment.  she verbalized understanding.  Per Pharm D, patient may hold Eliquis for 3 days prior to procedure.    I will route this recommendation to the requesting party via Epic fax function.  Please call with questions.  Time:   Today, I have spent 6 minutes with the patient with telehealth technology discussing medical history, symptoms, and management plan.     Carlos Levering, NP  06/29/2023, 3:03 PM

## 2023-06-30 ENCOUNTER — Other Ambulatory Visit (INDEPENDENT_AMBULATORY_CARE_PROVIDER_SITE_OTHER): Payer: Medicare Other

## 2023-06-30 ENCOUNTER — Encounter (HOSPITAL_COMMUNITY): Payer: Self-pay

## 2023-06-30 DIAGNOSIS — D539 Nutritional anemia, unspecified: Secondary | ICD-10-CM | POA: Diagnosis not present

## 2023-06-30 LAB — URINALYSIS, ROUTINE W REFLEX MICROSCOPIC
Bilirubin Urine: NEGATIVE
Hgb urine dipstick: NEGATIVE
Ketones, ur: NEGATIVE
Nitrite: NEGATIVE
RBC / HPF: NONE SEEN (ref 0–?)
Specific Gravity, Urine: 1.01 (ref 1.000–1.030)
Total Protein, Urine: NEGATIVE
Urine Glucose: NEGATIVE
Urobilinogen, UA: 0.2 (ref 0.0–1.0)
pH: 6.5 (ref 5.0–8.0)

## 2023-06-30 LAB — IBC PANEL
Iron: 62 ug/dL (ref 42–145)
Saturation Ratios: 18.1 % — ABNORMAL LOW (ref 20.0–50.0)
TIBC: 341.6 ug/dL (ref 250.0–450.0)
Transferrin: 244 mg/dL (ref 212.0–360.0)

## 2023-06-30 LAB — FERRITIN: Ferritin: 62.3 ng/mL (ref 10.0–291.0)

## 2023-06-30 NOTE — Anesthesia Preprocedure Evaluation (Signed)
Anesthesia Evaluation  Patient identified by MRN, date of birth, ID band Patient awake    Reviewed: Allergy & Precautions, NPO status , Patient's Chart, lab work & pertinent test results  History of Anesthesia Complications (+) PONV and history of anesthetic complications  Airway Mallampati: II  TM Distance: >3 FB Neck ROM: Full    Dental  (+) Dental Advisory Given   Pulmonary sleep apnea (does not use CPAP) , pneumonia, COPD,  COPD inhaler, former smoker   breath sounds clear to auscultation       Cardiovascular hypertension, Pt. on medications pulmonary hypertension(-) angina (-) dysrhythmias + pacemaker (St Jude dual chamber for 2:1 AVB) + Valvular Problems/Murmurs (S/P MVR and AVR 2015, mild MS)  Rhythm:Regular Rate:Normal + Systolic murmurs Pacemaker interrogation 04/2023 Dr. Royann Shivers comments "Complete heart block: Pacemaker dependent. Battery status is good. Lead measurements are stable. Heart rate histogram is favorable. No clinically significant episodes of high ventricular rate or atrial mode switch noted.  Occasional brief episodes of atrial tachycardia, but atrial fibrillation has not been seen since October 2022."   Echo 09/2022  1. Left ventricular ejection fraction, by estimation, is 60 to 65%. The left ventricle has normal function. The left ventricle has no regional wall motion abnormalities. There is moderate concentric left ventricular hypertrophy. Left ventricular diastolic parameters are indeterminate.   2. Right ventricular systolic function is mildly reduced. The right ventricular size is moderately enlarged. There is moderately elevated pulmonary artery systolic pressure. The estimated right ventricular systolic pressure is 46.7 mmHg.   3. Left atrial size was severely dilated.   4. The mitral valve has been repaired/replaced. Trivial mitral valve regurgitation. There is a 25 mm Magna Ease bioprosthetic valve  present in the mitral position. Procedure Date: 12/22/2013. The mean gradient is at HR 74, EOA 1.3cm2. 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   5. The aortic valve has been repaired/replaced. There is a 21 mm bioprosthetic valve present in the aortic position. Procedure Date: 12/22/2013. Echo findings are consistent with normal structure and function of the aortic valve prosthesis. Aortic valve mean gradient measures 14.0 mmHg. Aortic valve Vmax measures 2.56 m/s.  Trivial paravalvular leak   6. Aortic dilatation noted. There is mild dilatation of the ascending aorta, measuring 43 mm.   7. The inferior vena cava is dilated in size with >50% respiratory variability, suggesting right atrial pressure of 8 mmHg.   Comparison(s): Compared to prior TTE on 10/2021, there is no significant change. Prior mean mitral gradient 9.46mmHg, prior mean aortic gradient 12.44mmHg.      5/19 Stress: EF 57%, normal with no evidence of ischemia or prior MI '17 ECHO: EF 60-65%, AVR and MVR functioning normally   Neuro/Psych  PSYCHIATRIC DISORDERS  Depression    Chronic back pain    GI/Hepatic ,GERD  Controlled,,H/o pancreatitis H/o SBO   Endo/Other  negative endocrine ROS    Renal/GU Renal disease     Musculoskeletal  (+) Arthritis ,    Abdominal   Peds  Hematology  (+) Blood dyscrasia, anemia xarelto   Anesthesia Other Findings Pt in Aflutter, pacer dependent with underlying ventricular rate 30  Reproductive/Obstetrics                              Anesthesia Physical Anesthesia Plan  ASA: 4  Anesthesia Plan: General   Post-op Pain Management: Ofirmev IV (intra-op)*   Induction: Intravenous  PONV Risk Score and Plan: 4 or  greater and Dexamethasone, Ondansetron and Treatment may vary due to age or medical condition  Airway Management Planned: LMA  Additional Equipment:   Intra-op Plan:   Post-operative Plan: Extubation in OR  Informed Consent: I  have reviewed the patients History and Physical, chart, labs and discussed the procedure including the risks, benefits and alternatives for the proposed anesthesia with the patient or authorized representative who has indicated his/her understanding and acceptance.     Dental advisory given  Plan Discussed with: CRNA  Anesthesia Plan Comments: (See PAT note from 9/19 by Sherlie Ban PA-C )         Anesthesia Quick Evaluation

## 2023-06-30 NOTE — Progress Notes (Addendum)
Anesthesia Review:   DISCUSSION: [name] is a 77 year old female who presents to PAT prior to CYSTOSCOPY RIGHT RETROGRADE PYEPLGRAM RIGHT URETEROSCOPY/HOLMIUM LASER/STENT PLACEMENT on 9/23 last 24 with Dr. Alvester Morin.  Past medical history of former smoking, HTN, A-fib/flutter on chronic anticoagulation, severe aortic valve stenosis s/p AVR (2015), mitral valve stenosis s/p MVR (2015), CHF, carotid artery stenosis TAA, history of heart block s/p pacemaker placement, COPD, CKD, anemia, thrombocytopenia, obesity, arthritis, chronic back pain s/p lumbar fusion (2019  Prior anesthesia complications include PONV  Patient follows with cardiology.  Last seen in clinic on 03/30/2023 by Dr. Royann Shivers.  Noted to be doing well from a cardiac standpoint she is asymptomatic.  Cardiac clearance received on 9/19:  "Preoperative cardiovascular risk assessment. Ureteroscopy for kidney stones by Dr. Alvester Morin on 07/03/2023.  Chart reviewed as part of pre-operative protocol coverage. According to the RCRI, patient has a 0.9% risk of MACE. Patient reports activity equivalent to 4.73 METS (per DASI).   Given past medical history and time since last visit, based on ACC/AHA guidelines, [name] would be at acceptable risk for the planned procedure without further cardiovascular testing.  Per Pharm D, patient may hold Eliquis for 3 days prior to procedure."  Patient follows with pulmonology for COPD.  Last seen in clinic on 06/05/2023 for routine follow-up.  Noted to be stable from a respiratory standpoint.  Uses inhalers.  CT of the chest showed enlarging pulmonary nodules and possible chronic infection.  Per Dr. Marchelle Gearing: "2 years ago bronch did not show MAC infections. Nodules do not appear consistent with lung cancer". Plan is to check labs, spirometry, DLCO, and consider repeat bronch vs empiric azithromycin vs continued observation. F/u in 2-3 months.  PAT labs are remarkable for worsening anemia over the past  several months.  PCP is aware and will be ordering additional labs to follow-up.  Pacemaker device orders in note from 06/30/23:  Electrophysiologist's Recommendations:   Have magnet available. Provide continuous ECG monitoring when magnet is used or reprogramming is to be performed.  Procedure may interfere with device function.  Magnet should be placed over device during procedure.   VS: BP 119/76   Pulse 74   Temp 36.8 C (Oral)   Resp 16   Ht 5\' 3"  (1.6 m)   Wt 67.5 kg   SpO2 95%   BMI 26.36 kg/m   PROVIDERS: PCP: Marylene Buerger LOV 06/15/23 LOV 06/28/2023  Cardiologist : Croituri- LOV 03/30/23  Has appt on 06/29/23 for preop clearance - telephone visit  Pulm- DR Marchelle Gearing- LOV 06/05/23    LABS: Labs reviewed: Acceptable for surgery. (all labs ordered are listed, but only abnormal results are displayed)  Labs Reviewed  BASIC METABOLIC PANEL - Abnormal; Notable for the following components:      Result Value   Calcium 8.8 (*)    All other components within normal limits  CBC - Abnormal; Notable for the following components:   RBC 3.15 (*)    Hemoglobin 10.2 (*)    HCT 32.9 (*)    MCV 104.4 (*)    Platelets 86 (*)    All other components within normal limits     IMAGES:  CT Chest: 05/29/23  IMPRESSION: 1. 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 2. 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 3. Aortic atherosclerosis, in addition to  left main and three-vessel coronary artery disease. Assessment for potential risk factor modification, dietary therapy or pharmacologic therapy may be warranted, if clinically indicated. 4. Trace bilateral pleural effusions, unchanged. 5. Additional incidental findings, as above.   Aortic Atherosclerosis  (ICD10-I70.0).   EKG:   CV:  Pacemaker check 04/28/23:  Duplicate report.  Remote reviewed.   Complete heart block: Pacemaker dependent. Battery status is good. Lead measurements are stable. Heart rate histogram is favorable. No clinically significant episodes of high ventricular rate or atrial mode switch noted.  Occasional brief episodes of atrial tachycardia, but atrial fibrillation has not been seen since October 2022.  Echo 10/06/22:  IMPRESSIONS     1. Left ventricular ejection fraction, by estimation, is 60 to 65%. The  left ventricle has normal function. The left ventricle has no regional  wall motion abnormalities. There is moderate concentric left ventricular  hypertrophy. Left ventricular  diastolic parameters are indeterminate.   2. Right ventricular systolic function is mildly reduced. The right  ventricular size is moderately enlarged. There is moderately elevated  pulmonary artery systolic pressure. The estimated right ventricular  systolic pressure is 46.7 mmHg.   3. Left atrial size was severely dilated.   4. The mitral valve has been repaired/replaced. Trivial mitral valve  regurgitation. There is a 25 mm Magna Ease bioprosthetic valve present in  the mitral position. Procedure Date: 12/22/2013. The mean gradient is  at HR 74, EOA 1.3cm2. Likely mild   prosthetic mitral stenosis.   5. The aortic valve has been repaired/replaced. There is a 21 mm  bioprosthetic valve present in the aortic position. Procedure Date:  12/22/2013. Echo findings are consistent with normal structure and function  of the aortic valve prosthesis. Aortic valve  mean gradient measures 14.0 mmHg. Aortic valve Vmax measures 2.56 m/s.  Trivial paravalvular leak   6. Aortic dilatation noted. There is mild dilatation of the ascending  aorta, measuring 43 mm.   7. The inferior vena cava is dilated in size with >50% respiratory  variability, suggesting right atrial pressure of 8  mmHg.   Comparison(s): Compared to prior TTE on 10/2021, there is no significant  change. Prior mean mitral gradient 9.44mmHg, prior mean aortic gradient  12.24mmHg.   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 Known DISH with thoracic spine fusion and moderate kyphosis, h/o L spine fusion L2-5   Adjustment disorder with depressed mood 10/24/2014   27 yo son died MI 10/16/14 Husband died car accident 04-17-2015   Anemia    Anticoagulant long-term use 06/14/2019   She takes warfarin for afib/flutter with CHADS2VASc score of 4 (age, sex, CHF, HTN), s/p 2 bioprosthetic valve replacements Coumadin changed to eliquis 2022 per cardiology   Aortic atherosclerosis 03/03/2022   Noted on 01/2022 chest CT   Arthritis    Arthropathy of spinal facet joint 03/08/2018   Ascending aortic aneurysm 03/02/2021   4.1cm on CT 01/2021 rec yearly monitoring   Bilateral hip pain 09/08/2016   Cancer (HCC)    conon cancer 2012   CAP (community acquired pneumonia) 02/08/2016   Carotid stenosis 12/08/2020   Carotid US - 40-59% BICA stenosis (2015) Carotid US 12/2020 - 1-39% BICA stenosis, incidental 3.4cm structure behind L common carotid artery rec further imaging    Cataracts, bilateral  immature   Chest pain 08/25/2014   myoview low risk 02/2018 ([place])   Chronic cholecystitis with calculus 01/21/2015   S/p cholecystectomy    Chronic diastolic heart failure 09/19/2014   Chronic insomnia    Chronic lower back pain 03/05/2008   Returned to Dr Danielle Dess - translaminar ESI at L3/4. Myelogram showed significant left sided foraminal stenosis at L2/3 and L3/4 in setting of solid arthrodesis, planned DEXA and if stable osteopenia, considering anterolateral decompression with spacer.    CKD (chronic  kidney disease) stage 2, GFR 60-89 ml/min 07/03/2015   Constipation    takes Miralax daily as needed   COPD (chronic obstructive pulmonary disease)    Albuterol inhaler daily as needed;Duoneb daily as needed;Spiriva daily   COVID-19 virus infection 11/11/2022   DDD (degenerative disc disease)    cervical - kyphosis with mod DD changes C5/6 and C6/7; lumbar - early DD at L2/3 ([facility]ner)   Depression    DISH (diffuse idiopathic skeletal hyperostosis) 05/14/2019   By MRI noted fusion of lower thoracic vertebrae - likely contributing to unsteadiness (Elsner)   Dyspnea    with exertion   Dyspnea on exertion 06/05/2015   myoview low risk 02/2018   Edema of both lower legs due to peripheral venous insufficiency    Essential hypertension, benign 03/24/2009   hx of not on nmeds currenlty and has been a while since on meds per pt   Heart murmur    Hematuria 06/17/2021   History of kidney stones    History of radiation therapy 05/30/11 to 07/07/11   rectum   History of rectal cancer 05/26/2011   Rectal cancer 2012, midrectum [id number], s/p neoadj chemo&XRT, lap LAR with diverting loop ileostomy 08/2011, ileostomy takedown 03/2012 Discharged from oncology clinic 2017 Truett Perna)   History of shingles    History of vertebral fracture 11/26/2019   Old L1 vertebral compression fracture incidentally noted on imaging 2020    HLD (hyperlipidemia) 05/03/2007   Hyperlipemia    takes Lovastatin daily   Lacunar infarction    Left caudate   Left hip pain 07/29/2020   Legally blind in left eye    Lung nodule    RLL nodule-93mm stable 2006, April 2009, and June 2009   Lymphangioma 01/07/2021   Noted incidentally on carotid US 12/2020 CT neck - Likely benign lymphangioma 01/2021   Macrocytosis 03/27/2022   Folate and b12 checked 2023   Malaise and fatigue 01/22/2018   Meralgia paresthetica 12/13/2007   Mixed stress and urge urinary incontinence 03/16/2015   Failed oxybutynin IR/ER. Vesicare initially helped  but then no longer helpful.  myrbetriq was not effective.  Saw urology (MacDiarmid) - UDS largely overactive bladder with mild-mod stress incontinence. Improved on abx course - maintained on [place].    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   Pulmonary infiltrates 04/23/2019   06/13/2018-CT super D chest without contrast- generally stable chronic lung disease with pleural parenchymal scarring and scattered nodularity most of these nodules have been tree-in-bud in appearance likely postinfectious or inflammatory the dominant right lower lobe nodule seen on most recent study has resolved no new or enlarging nodules  04/22/2019-CT chest without contrast- waxing and waning per   Pulmonary nodule 02/23/2012   RLL nodule-20mm stable 2006, April 2009, and June 2009 Small pulm nodules Jan 2012 - > Oct 2012 without change   Rheumatic heart disease mitral stenosis    mod MS by echo 02/2014   Right leg swelling 06/11/2020   R venous US 06/2020 WNL: - No evidence of deep vein thrombosis in the lower extremity. No  indirect evidence of obstruction proximal to the inguinal ligament. - No cystic structure found in the popliteal fossa.   S/P aortic and mitral valve bioprostheses 12/08/2013   AVR 21 mm, MVR 25 mm-MagnaEase pericardia   S/P AVR (aortic valve replacement) 2015   bioprosthetic (Bartle)   S/P MVR (mitral valve replacement) 2015   bioprosthetic (Bartle)   Sepsis secondary to UTI (HCC) 08/22/2017   Severe aortic valve stenosis 10/01/2013   mild-mod by echo 02/2014   Skin lesion 01/03/2022   Small bowel obstruction, partial 08/2013   reolved without NGT placement.    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   Dr Amy [place]   Syncope 05/28/2018   Thrombocytopenia 11/04/2019   Typical atrial flutter 05/15/2019   Vitamin B12 deficiency 02/28/2015   Start B12 shots 02/2015    Vitamin D deficiency 10/23/2016    Past Surgical History:  Procedure Laterality Date   ANTERIOR LAT LUMBAR FUSION Left 07/17/2018   Procedure: Lumbar Two-Three Lumbar Three-Four Anterolateral decompression/interbody fusion with lateral plate fixation/Infuse;  Surgeon: Barnett Abu, MD;  Location: [facility] OR;  Service: Neurosurgery;  Laterality: Left;  Lumbar Two-Three Lumbar Three-Four Anterolateral decompression/interbody fusion with lateral plate fixation/Infuse   AORTIC VALVE REPLACEMENT N/A 12/12/2013   Procedure: AORTIC VALVE REPLACEMENT (AVR);  Surgeon: Alleen Borne, MD; Service: Open Heart Surgery   BACK SURGERY  2006, 2007   2006 SPACER, 2007 decompression and fusion L4/5   BOWEL RESECTION  04/02/2012   Procedure: SMALL BOWEL RESECTION;  Surgeon: [name], MD;  Location: [facility] ORS;  Service: General;  Laterality: N/A;   BRONCHIAL WASHINGS  03/24/2021   Procedure: BRONCHIAL WASHINGS;  Surgeon: [name], MD;  Location: [facility] ENDOSCOPY;  Service: Endoscopy;;   CARPAL TUNNEL RELEASE Bilateral    CATARACT EXTRACTION W/ INTRAOCULAR LENS IMPLANT Left 10/18/2016   CATARACT EXTRACTION W/  INTRAOCULAR LENS IMPLANT Right 12/13/2016   Dr. Dagoberto Ligas   CHOLECYSTECTOMY N/A 01/21/2015   chronic cholecystitis, [name], MD   COLON RESECTION  08/25/2011   Procedure: COLON RESECTION LAPAROSCOPIC;  Surgeon: [name], MD;  Location: [facility];  Service: General;  Laterality: N/A;  Laparoscopic Assisted Low Anterior Resection Diverting Ostomy and onQ pain pump   COLONOSCOPY  03/2013   1 polyp, rpt 3 yrs Christella Hartigan)   COLONOSCOPY  05/2021   diverticulosis and patent colon anastomosis with some edema/narrowing, rpt 5 yrs Christella Hartigan)   COLONOSCOPY WITH PROPOFOL N/A 06/09/2016   patent colo-colonic anastomosis, rpt 5 yrs Christella Hartigan)   EP IMPLANTABLE DEVICE N/A 06/16/2016   Procedure:  Pacemaker Implant;  Surgeon: Will Jorja Loa, MD;  Location: [facility] INVASIVE CV [facility];  Service: Cardiovascular;  Laterality: N/A;   FOOT SURGERY  left foot   hammer toe   ILEOSTOMY  08/25/2011   ILEOSTOMY CLOSURE  04/02/2012   Procedure: ILEOSTOMY TAKEDOWN;  Surgeon: [name], MD;  Location: [facility];  Service: General;  Laterality: N/A;   INTRAOPERATIVE TRANSESOPHAGEAL ECHOCARDIOGRAM N/A 12/12/2013   Procedure: INTRAOPERATIVE TRANSESOPHAGEAL ECHOCARDIOGRAM;  Surgeon: Alleen Borne, MD;  Location: [facility] OR;  Service: Open Heart Surgery;  Laterality: N/A;   LEFT AND RIGHT HEART CATHETERIZATION WITH CORONARY ANGIOGRAM N/A 11/27/2013   Procedure: LEFT AND RIGHT HEART CATHETERIZATION WITH CORONARY ANGIOGRAM;  Surgeon: Thurmon Fair, MD;  Location: [facility] CATH LAB;  Service: Cardiovascular;  Laterality: N/A;   MITRAL VALVE REPLACEMENT N/A 12/12/2013   Procedure: MITRAL VALVE (MV) REPLACEMENT OR REPAIR;  Surgeon: Alleen Borne, MD; Service: Open Heart Surgery   TEE WITHOUT CARDIOVERSION N/A 11/29/2013   Procedure: TRANSESOPHAGEAL ECHOCARDIOGRAM (TEE);  Surgeon: Quintella Reichert, MD;  Location: [facility] ENDOSCOPY;  Service: Cardiovascular;  Laterality: N/A;   TEE WITHOUT CARDIOVERSION N/A 06/04/2019   Procedure: TRANSESOPHAGEAL  ECHOCARDIOGRAM (TEE);  Surgeon: Thurmon Fair, MD;  Location: [facility] ENDOSCOPY;  Service: Cardiovascular;  Laterality: N/A;   TONSILLECTOMY     VIDEO BRONCHOSCOPY N/A 03/24/2021   Procedure: VIDEO BRONCHOSCOPY WITHOUT FLUORO;  Surgeon: [name], MD;  Location: [facility] ENDOS[facility];  Service: Endoscopy;  Laterality: N/A;  SMALL SCOPE,PREMEDICATE WITH NEBULIZED LIDOCAINE    MEDICATIONS:  acetaminophen (TYLENOL) 500 MG tablet   acidophilus (RISAQUAD) CAPS capsule   albuterol (VENTOLIN HFA) 108 (90 Base) MCG/ACT inhaler   alendronate (FOSAMAX) 70 MG tablet   ascorbic acid (VITAMIN C) 500 MG tablet   Biotin 1000 MCG tablet   buPROPion (WELLBUTRIN SR) 150 MG 12 hr tablet   calcium carbonate (OSCAL) 1500 (600 Ca) MG TABS tablet   carboxymethylcellulose (REFRESH PLUS) 0.5 % SOLN   cephALEXin (KEFLEX) 250 MG capsule   cholecalciferol (VITAMIN D3) 25 MCG (1000 UNIT) tablet   docusate sodium (COLACE) 100 MG capsule   ELIQUIS 5 MG TABS tablet   Fluticasone-Umeclidin-Vilant (TRELEGY ELLIPTA) 200-62.5-25 MCG/ACT AEPB   furosemide (LASIX) 40 MG tablet   gabapentin (NEURONTIN) 100 MG capsule   gabapentin (NEURONTIN) 300 MG capsule   hydrocortisone (ANUSOL-HC) 2.5 % rectal cream   ipratropium-albuterol (DUONEB) 0.5-2.5 (3) MG/3ML SOLN   lovastatin (MEVACOR) 40 MG tablet   montelukast (SINGULAIR) 10 MG tablet   Multiple Vitamins-Minerals (PRESERVISION AREDS 2 PO)   Phenazopyrid-Cranbry-C-Probiot (AZO URINARY TRACT SUPPORT PO)   polyethylene glycol (MIRALAX / GLYCOLAX) packet   potassium chloride (KLOR-CON) 10 MEQ tablet   psyllium (REGULOID) 0.52 g capsule   traMADol (ULTRAM) 50 MG tablet   traZODone (DESYREL) 50 MG tablet   Vibegron (GEMTESA) 75 MG TABS   vitamin B-12 (CYANOCOBALAMIN) 500 MCG tablet   Zinc 50 MG CAPS   No current facility-administered medications for this encounter.    ondansetron (ZOFRAN) 4 mg in sodium chloride 0.9 % 50 mL IVPB   Ubaldo Glassing, PA-C [facility] Short  Stay/Anesthesiology [facility] Phone [phone] 06/30/2023 9:15 AM

## 2023-07-03 ENCOUNTER — Ambulatory Visit (HOSPITAL_BASED_OUTPATIENT_CLINIC_OR_DEPARTMENT_OTHER): Payer: Medicare Other | Admitting: Anesthesiology

## 2023-07-03 ENCOUNTER — Encounter: Payer: Self-pay | Admitting: Family Medicine

## 2023-07-03 ENCOUNTER — Encounter (HOSPITAL_COMMUNITY)
Admission: RE | Disposition: A | Discharge status: Home / Self Care | Payer: Self-pay | Source: Home / Self Care | Attending: Urology

## 2023-07-03 ENCOUNTER — Ambulatory Visit (HOSPITAL_COMMUNITY): Payer: Medicare Other | Admitting: Medical

## 2023-07-03 ENCOUNTER — Ambulatory Visit (HOSPITAL_COMMUNITY)
Admission: RE | Admit: 2023-07-03 | Discharge: 2023-07-03 | Disposition: A | Discharge status: Home / Self Care | Payer: Medicare Other | Attending: Urology | Admitting: Urology

## 2023-07-03 ENCOUNTER — Telehealth: Payer: Self-pay

## 2023-07-03 ENCOUNTER — Ambulatory Visit (HOSPITAL_COMMUNITY): Payer: Medicare Other

## 2023-07-03 ENCOUNTER — Encounter (HOSPITAL_COMMUNITY): Payer: Self-pay | Admitting: Urology

## 2023-07-03 ENCOUNTER — Ambulatory Visit: Payer: Medicare Other | Admitting: Physical Therapy

## 2023-07-03 ENCOUNTER — Other Ambulatory Visit (INDEPENDENT_AMBULATORY_CARE_PROVIDER_SITE_OTHER): Payer: Medicare Other

## 2023-07-03 DIAGNOSIS — N201 Calculus of ureter: Secondary | ICD-10-CM

## 2023-07-03 DIAGNOSIS — M1990 Unspecified osteoarthritis, unspecified site: Secondary | ICD-10-CM | POA: Insufficient documentation

## 2023-07-03 DIAGNOSIS — Z950 Presence of cardiac pacemaker: Secondary | ICD-10-CM | POA: Insufficient documentation

## 2023-07-03 DIAGNOSIS — I4892 Unspecified atrial flutter: Secondary | ICD-10-CM | POA: Insufficient documentation

## 2023-07-03 DIAGNOSIS — Z87891 Personal history of nicotine dependence: Secondary | ICD-10-CM | POA: Diagnosis not present

## 2023-07-03 DIAGNOSIS — K219 Gastro-esophageal reflux disease without esophagitis: Secondary | ICD-10-CM | POA: Diagnosis not present

## 2023-07-03 DIAGNOSIS — N132 Hydronephrosis with renal and ureteral calculous obstruction: Secondary | ICD-10-CM | POA: Insufficient documentation

## 2023-07-03 DIAGNOSIS — E669 Obesity, unspecified: Secondary | ICD-10-CM | POA: Diagnosis not present

## 2023-07-03 DIAGNOSIS — I700 Atherosclerosis of aorta: Secondary | ICD-10-CM | POA: Insufficient documentation

## 2023-07-03 DIAGNOSIS — Z7901 Long term (current) use of anticoagulants: Secondary | ICD-10-CM | POA: Diagnosis not present

## 2023-07-03 DIAGNOSIS — R195 Other fecal abnormalities: Secondary | ICD-10-CM

## 2023-07-03 DIAGNOSIS — Z85048 Personal history of other malignant neoplasm of rectum, rectosigmoid junction, and anus: Secondary | ICD-10-CM | POA: Diagnosis not present

## 2023-07-03 DIAGNOSIS — I130 Hypertensive heart and chronic kidney disease with heart failure and stage 1 through stage 4 chronic kidney disease, or unspecified chronic kidney disease: Secondary | ICD-10-CM | POA: Insufficient documentation

## 2023-07-03 DIAGNOSIS — J449 Chronic obstructive pulmonary disease, unspecified: Secondary | ICD-10-CM

## 2023-07-03 DIAGNOSIS — Z01818 Encounter for other preprocedural examination: Secondary | ICD-10-CM

## 2023-07-03 DIAGNOSIS — N182 Chronic kidney disease, stage 2 (mild): Secondary | ICD-10-CM | POA: Diagnosis not present

## 2023-07-03 DIAGNOSIS — Z952 Presence of prosthetic heart valve: Secondary | ICD-10-CM | POA: Diagnosis not present

## 2023-07-03 DIAGNOSIS — N202 Calculus of kidney with calculus of ureter: Secondary | ICD-10-CM | POA: Diagnosis present

## 2023-07-03 DIAGNOSIS — Z7902 Long term (current) use of antithrombotics/antiplatelets: Secondary | ICD-10-CM | POA: Insufficient documentation

## 2023-07-03 DIAGNOSIS — I4819 Other persistent atrial fibrillation: Secondary | ICD-10-CM | POA: Diagnosis not present

## 2023-07-03 DIAGNOSIS — D539 Nutritional anemia, unspecified: Secondary | ICD-10-CM

## 2023-07-03 DIAGNOSIS — G4730 Sleep apnea, unspecified: Secondary | ICD-10-CM | POA: Diagnosis not present

## 2023-07-03 DIAGNOSIS — I442 Atrioventricular block, complete: Secondary | ICD-10-CM | POA: Insufficient documentation

## 2023-07-03 DIAGNOSIS — I5032 Chronic diastolic (congestive) heart failure: Secondary | ICD-10-CM | POA: Insufficient documentation

## 2023-07-03 DIAGNOSIS — I10 Essential (primary) hypertension: Secondary | ICD-10-CM | POA: Insufficient documentation

## 2023-07-03 HISTORY — PX: CYSTOSCOPY/URETEROSCOPY/HOLMIUM LASER/STENT PLACEMENT: SHX6546

## 2023-07-03 LAB — RETICULOCYTES
ABS Retic: 40560 cells/uL (ref 20000–80000)
Retic Ct Pct: 1.2 %

## 2023-07-03 LAB — HAPTOGLOBIN: Haptoglobin: 86 mg/dL (ref 43–212)

## 2023-07-03 LAB — LACTATE DEHYDROGENASE: LDH: 225 U/L (ref 120–250)

## 2023-07-03 LAB — FECAL OCCULT BLOOD, IMMUNOCHEMICAL: Fecal Occult Bld: POSITIVE — AB

## 2023-07-03 SURGERY — CYSTOSCOPY/URETEROSCOPY/HOLMIUM LASER/STENT PLACEMENT
Anesthesia: General | Laterality: Right

## 2023-07-03 MED ORDER — ONDANSETRON HCL 4 MG/2ML IJ SOLN
INTRAMUSCULAR | Status: AC
  Filled 2023-07-03: qty 2

## 2023-07-03 MED ORDER — PROPOFOL 10 MG/ML IV BOLUS
INTRAVENOUS | Status: DC | PRN
  Administered 2023-07-03: 20 mg via INTRAVENOUS
  Administered 2023-07-03: 30 mg via INTRAVENOUS
  Administered 2023-07-03: 100 mg via INTRAVENOUS

## 2023-07-03 MED ORDER — DEXAMETHASONE SODIUM PHOSPHATE 4 MG/ML IJ SOLN
INTRAMUSCULAR | Status: DC | PRN
  Administered 2023-07-03: 4 mg via INTRAVENOUS

## 2023-07-03 MED ORDER — OXYCODONE HCL 5 MG/5ML PO SOLN: 5.0000 mg | Freq: Once | ORAL | Status: AC | PRN

## 2023-07-03 MED ORDER — PHENYLEPHRINE 80 MCG/ML (10ML) SYRINGE FOR IV PUSH (FOR BLOOD PRESSURE SUPPORT)
PREFILLED_SYRINGE | INTRAVENOUS | Status: DC | PRN
Start: 2023-07-03 — End: 2023-07-04
  Administered 2023-07-03: 80 ug via INTRAVENOUS

## 2023-07-03 MED ORDER — PHENYLEPHRINE 80 MCG/ML (10ML) SYRINGE FOR IV PUSH (FOR BLOOD PRESSURE SUPPORT)
PREFILLED_SYRINGE | INTRAVENOUS | Status: AC
  Filled 2023-07-03: qty 10

## 2023-07-03 MED ORDER — LIDOCAINE HCL (PF) 2 % IJ SOLN
INTRAMUSCULAR | Status: AC
  Filled 2023-07-03: qty 5

## 2023-07-03 MED ORDER — CEFAZOLIN SODIUM-DEXTROSE 2-4 GM/100ML-% IV SOLN
2.0000 g | INTRAVENOUS | Status: AC
  Administered 2023-07-03: 2 g via INTRAVENOUS
  Filled 2023-07-03: qty 100

## 2023-07-03 MED ORDER — LACTATED RINGERS IV SOLN: INTRAVENOUS | Status: DC

## 2023-07-03 MED ORDER — IOHEXOL 300 MG/ML  SOLN
INTRAMUSCULAR | Status: DC | PRN
  Administered 2023-07-03: 20 mL

## 2023-07-03 MED ORDER — CHLORHEXIDINE GLUCONATE 0.12 % MT SOLN
15.0000 mL | Freq: Once | OROMUCOSAL | Status: AC
  Administered 2023-07-03: 15 mL via OROMUCOSAL

## 2023-07-03 MED ORDER — FENTANYL CITRATE (PF) 100 MCG/2ML IJ SOLN
INTRAMUSCULAR | Status: DC | PRN
  Administered 2023-07-03 (×2): 25 ug via INTRAVENOUS

## 2023-07-03 MED ORDER — ACETAMINOPHEN 10 MG/ML IV SOLN
INTRAVENOUS | Status: DC | PRN
Start: 2023-07-03 — End: 2023-07-04
  Administered 2023-07-03: 1000 mg via INTRAVENOUS

## 2023-07-03 MED ORDER — ACETAMINOPHEN 10 MG/ML IV SOLN
INTRAVENOUS | Status: AC
  Filled 2023-07-03: qty 100

## 2023-07-03 MED ORDER — ORAL CARE MOUTH RINSE: 15.0000 mL | Freq: Once | OROMUCOSAL | Status: AC

## 2023-07-03 MED ORDER — LIDOCAINE HCL (CARDIAC) PF 100 MG/5ML IV SOSY
PREFILLED_SYRINGE | INTRAVENOUS | Status: DC | PRN
  Administered 2023-07-03: 60 mg via INTRAVENOUS

## 2023-07-03 MED ORDER — ONDANSETRON 4 MG PO TBDP: 4.0000 mg | ORAL_TABLET | Freq: Three times a day (TID) | ORAL | 1 refills | Status: DC | PRN

## 2023-07-03 MED ORDER — PROPOFOL 10 MG/ML IV BOLUS
INTRAVENOUS | Status: AC
  Filled 2023-07-03: qty 20

## 2023-07-03 MED ORDER — OXYCODONE HCL 5 MG PO TABS
ORAL_TABLET | ORAL | Status: AC
  Administered 2023-07-03: 5 mg via ORAL
  Filled 2023-07-03: qty 1

## 2023-07-03 MED ORDER — ONDANSETRON HCL 4 MG/2ML IJ SOLN
INTRAMUSCULAR | Status: DC | PRN
  Administered 2023-07-03: 4 mg via INTRAVENOUS

## 2023-07-03 MED ORDER — EPHEDRINE 5 MG/ML INJ
INTRAVENOUS | Status: AC
  Filled 2023-07-03: qty 5

## 2023-07-03 MED ORDER — ONDANSETRON HCL 4 MG/2ML IJ SOLN: 4.0000 mg | Freq: Once | INTRAMUSCULAR | Status: DC | PRN

## 2023-07-03 MED ORDER — DEXAMETHASONE SODIUM PHOSPHATE 10 MG/ML IJ SOLN
INTRAMUSCULAR | Status: AC
  Filled 2023-07-03: qty 1

## 2023-07-03 MED ORDER — OXYCODONE HCL 5 MG PO TABS: 5.0000 mg | ORAL_TABLET | Freq: Once | ORAL | Status: AC | PRN

## 2023-07-03 MED ORDER — FENTANYL CITRATE PF 50 MCG/ML IJ SOSY: 25.0000 ug | PREFILLED_SYRINGE | INTRAMUSCULAR | Status: DC | PRN

## 2023-07-03 MED ORDER — SODIUM CHLORIDE 0.9 % IR SOLN
Status: DC | PRN
  Administered 2023-07-03: 3000 mL

## 2023-07-03 MED ORDER — FENTANYL CITRATE (PF) 100 MCG/2ML IJ SOLN
INTRAMUSCULAR | Status: AC
  Filled 2023-07-03: qty 2

## 2023-07-03 SURGICAL SUPPLY — 25 items
BAG URO CATCHER STRL LF (MISCELLANEOUS) ×1 IMPLANT
BASKET LASER NITINOL 1.9FR (BASKET) IMPLANT
BASKET ZERO TIP NITINOL 2.4FR (BASKET) IMPLANT
BSKT STON RTRVL 120 1.9FR (BASKET)
BSKT STON RTRVL ZERO TP 2.4FR (BASKET) ×1
CATH URETERAL DUAL LUMEN 10F (MISCELLANEOUS) IMPLANT
CATH URETL OPEN END 6FR 70 (CATHETERS) ×1 IMPLANT
CLOTH BEACON ORANGE TIMEOUT ST (SAFETY) ×1 IMPLANT
EXTRACTOR STONE 1.7FRX115CM (UROLOGICAL SUPPLIES) IMPLANT
GLOVE BIO SURGEON STRL SZ7.5 (GLOVE) ×1 IMPLANT
GOWN STRL REUS W/ TWL XL LVL3 (GOWN DISPOSABLE) ×1 IMPLANT
GOWN STRL REUS W/TWL XL LVL3 (GOWN DISPOSABLE) ×1
GUIDEWIRE ANG ZIPWIRE 038X150 (WIRE) IMPLANT
GUIDEWIRE STR DUAL SENSOR (WIRE) ×1 IMPLANT
KIT TURNOVER KIT A (KITS) IMPLANT
LASER FIB FLEXIVA PULSE ID 365 (Laser) IMPLANT
MANIFOLD NEPTUNE II (INSTRUMENTS) ×1 IMPLANT
PACK CYSTO (CUSTOM PROCEDURE TRAY) ×1 IMPLANT
SHEATH NAVIGATOR HD 11/13X28 (SHEATH) IMPLANT
SHEATH NAVIGATOR HD 11/13X36 (SHEATH) IMPLANT
STENT URET 6FRX24 CONTOUR (STENTS) IMPLANT
TRACTIP FLEXIVA PULS ID 200XHI (Laser) IMPLANT
TRACTIP FLEXIVA PULSE ID 200 (Laser) ×1
TUBING CONNECTING 10 (TUBING) ×1 IMPLANT
TUBING UROLOGY SET (TUBING) ×1 IMPLANT

## 2023-07-03 NOTE — H&P (Addendum)
[name]: 05/23/2023: This patient has an obsructing distal right ureteral calculi identified on CT imaging from August 2. Now back today for repeat evaluation with KUB. Denies interval stone material passage. She remains on tamsulosin as well as her daily suppressive antibiotic. No interval recurrence of gross hematuria, denies dysuria. She has not had any new or worsening right-sided pain or discomfort suggestive of obstructive uropathy. She does endorse some increased LUTS specifically urgency and incontinence despite using her InterStim. Of note past history significant for refractory OAB wet. Denies any interval fevers or chills, nausea/vomiting.   06/22/2023: Returns today for follow-up exam in regards to previous identified distal right ureteral calculi. Denies interval stone material passage. She has had no right-sided pain or discomfort suggestive of obstructive uropathy. Denies gross hematuria, dysuria, interval fevers/chills or nausea/vomiting. She continues with fairly prominent frequency/urgency and incontinence only moderately controlled with InterStim and Gemtesa. She is also on suppressive antimicrobial treatment for chronic cystitis. KUB today is inconclusive but I still feel I can see a faint opacity in the lower portion of the right hemipelvis consistent with the previously noted stone. Hydronephrosis on today's ultrasound has increased.     ALLERGIES: Codeine - Nausea Prednisone - Hives Sulfa - Nausea    MEDICATIONS: Cephalexin 250 mg tablet 1 tablet PO Daily  Flomax 0.4 mg capsule 1 capsule PO Daily  Acetaminophen  Albuterol Sulfate  Biotin  Calcium Carbonate  Cholecalciferol  Docusate Sodium 100 mg capsule  Eliquis  Furosemide 40 mg tablet  Gabapentin  Hydrocodone-Acetaminophen 5 mg-325 mg tablet 1-2 tablet PO Q 6 H PRN  Ipratropium-Albuterol  Lovastatin  Montelukast Sodium 10 mg tablet  Multivitamin  Polyethylene Glycol  Potassium Chloride  Tramadol Hcl 50 mg tablet   Trazodone Hcl 50 mg tablet  Trelegy Ellipta  Vitamin B12  Wellbutrin Sr 100 mg tablet,sustained-release 12 hr  Zinc     GU PSH: Complex cystometrogram, w/ void pressure and urethral pressure profile studies, any technique - 2019 Complex Uroflow - 2019 Cystoscopy - 04/04/2023, 01/27/2022, 2021, 2019 Emg surf Electrd - 2019 Inject For cystogram - 2019 Interstim Stage 1 - 03/15/2022, 01/28/2022 Interstim Stage 2 Generator - 03/15/2022 Intrabd voidng Press - 2019 Locm 300-399Mg /Ml Iodine,1Ml - 05/12/2023     NON-GU PSH: Back Surgery (Unspecified) - 2007 Mitral valve replacement - 2015 Remove Gallbladder - 2015 Replace Aortic Valve - 2015 Revise Ileostomy - 2013 Visit Complexity (formerly GPC1X) - 05/23/2023, 05/10/2023         GU PMH: Mixed incontinence - 05/23/2023, - 05/10/2023, - 04/04/2023, - 11/25/2022, - 10/21/2022, - 09/23/2022, - 08/26/2022, - 08/12/2022, - 07/25/2022, - 07/15/2022, - 07/01/2022, - 06/14/2022, - 05/30/2022, - 05/25/2022, - 05/11/2022, - 04/20/2022, - 03/30/2022, - 03/23/2022, - 02/28/2022, - 02/02/2022, - 01/27/2022, - 01/05/2022, - 2022, - 2019 Ureteral calculus - 05/23/2023 Urinary Frequency - 05/12/2023, - 04/04/2023, - 02/14/2023, - 11/25/2022, - 10/21/2022, - 08/12/2022, - 07/25/2022, - 07/15/2022, - 07/01/2022, - 06/14/2022, - 05/30/2022, - 04/20/2022, - 03/30/2022, - 03/23/2022, - 01/27/2022, - 01/05/2022, - 2022, - 2019 Urinary Tract Inf, Unspec site - 05/12/2023 Gross hematuria - 05/10/2023, - 01/27/2022 Microscopic hematuria - 02/14/2023, - 08/12/2022, - 07/25/2022, - 07/15/2022, - 07/01/2022      PMH Notes: Heart Failure, Kidney Failure   NON-GU PMH: Arthritis Asthma Cardiac murmur, unspecified Colon Cancer, History Hypercholesterolemia Hypertension Rectal Cancer, History    FAMILY HISTORY: 2 sons - Other father deceased - Other Heart problem - Mother High Blood Pressure - Mother Lung Cancer - Father mother deceased -  Other stroke - Mother    Notes: Mother died at 68 from a stroke, Father  died at 26 from Lung cancer   SOCIAL HISTORY: Marital Status: Widowed Drinks 1 drink per week. Types of alcohol consumed: Wine.  Drinks 4+ caffeinated drinks per day. Patient's occupation is/was Retired.    REVIEW OF SYSTEMS:     GU Review Female:  Patient reports frequent urination, hard to postpone urination, get up at night to urinate, and leakage of urine. Patient denies burning /pain with urination, stream starts and stops, trouble starting your stream, have to strain to urinate, and being pregnant.    Gastrointestinal (Upper):  Patient denies nausea, vomiting, and indigestion/ heartburn.    Gastrointestinal (Lower):  Patient denies diarrhea and constipation.    Constitutional:  Patient denies fever, night sweats, weight loss, and fatigue.    Skin:  Patient denies skin rash/ lesion and itching.    Eyes:  Patient denies blurred vision and double vision.    Ears/ Nose/ Throat:  Patient denies sore throat and sinus problems.    Hematologic/Lymphatic:  Patient reports easy bruising. Patient denies swollen glands.    Cardiovascular:  Patient denies leg swelling and chest pains.    Respiratory:  Patient denies cough and shortness of breath.    Endocrine:  Patient denies excessive thirst.    Musculoskeletal:  Patient reports joint pain. Patient denies back pain.    Neurological:  Patient denies headaches and dizziness.    Psychologic:  Patient denies depression and anxiety.    VITAL SIGNS:       06/22/2023 08:42 AM     BP 133/76 mmHg     Pulse 73 /min     Temperature 97.3 F / 36.2 C     MULTI-SYSTEM PHYSICAL EXAMINATION:      Constitutional: Thin. No physical deformities. Normally developed. Good grooming.      Neck: Neck symmetrical, not swollen. Normal tracheal position.     Respiratory: No labored breathing, no use of accessory muscles.      Cardiovascular: Normal temperature, normal extremity pulses, no swelling, no varicosities.      Skin: Sequalae of chronic anticoagulation  therapy.     Neurologic / Psychiatric: Oriented to time, oriented to place, oriented to person. No depression, no anxiety, no agitation.     Gastrointestinal: No mass, no tenderness, no rigidity, non obese abdomen.     Musculoskeletal: Normal gait and station of head and neck.            Complexity of Data:   Source Of History:  Patient, Medical Record Summary  Records Review:  Previous Doctor Records, Previous Hospital Records, Previous Patient Records  Urine Test Review:  Urinalysis, Urine Culture  X-Ray Review: KUB: Reviewed Films. Discussed With Patient.  Renal Ultrasound (Limited): Reviewed Films. Discussed With Patient.  C.T. Abdomen/Pelvis: Reviewed Films. Reviewed Report.      06/22/23  Urinalysis  Urine Appearance Cloudy   Urine Color Yellow   Urine Glucose Neg mg/dL  Urine Bilirubin Neg mg/dL  Urine Ketones Neg mg/dL  Urine Specific Gravity 1.020   Urine Blood Neg ery/uL  Urine pH 6.5   Urine Protein 1+ mg/dL  Urine Urobilinogen 0.2 mg/dL  Urine Nitrites Neg   Urine Leukocyte Esterase 2+ leu/uL  Urine WBC/hpf 10 - 20/hpf   Urine RBC/hpf NS (Not Seen)   Urine Epithelial Cells 10 - 20/hpf   Urine Bacteria Rare (0-9/hpf)   Urine Mucous Not Present   Urine Yeast NS (Not  Seen)   Urine Trichomonas Not Present   Urine Cystals NS (Not Seen)   Urine Casts Hyaline   Urine Sperm Not Present    PROCEDURES:    Renal Ultrasound (Limited) - 78295  Kidney: Right Length: 11.37cm Depth: 5.46cm Cortical Width: 1.10 cm Width: 5.50 cm    Right Kidney/Ureter:  moderate to severe hydro and hydroureter, several calcs noted, large cyst  Bladder:  empty      . Patient confirmed No Neulasta OnPro Device.       KUB - [id number]  A single view of the abdomen is obtained. KUB today remains inconclusive when compared to last imaging study. A faint ovoid shaped opacity continues in the distal right hemipelvis which retains similar size/shape as previously identified right ureteral  calculi on prior imaging studies including CT imaging. InterStim device noted, pacemaker lead noted in the left upper chest.      . Patient confirmed No Neulasta OnPro Device.      Visit Complexity - G2211      Urinalysis w/Scope - 81001  Dipstick Dipstick Cont'd Micro  Color: Yellow Bilirubin: Neg mg/dL WBC/hpf: 10 - 62/ZHY  Appearance: Cloudy Ketones: Neg mg/dL RBC/hpf: NS (Not Seen)  Specific Gravity: 1.020 Blood: Neg ery/uL Bacteria: Rare (0-9/hpf)  pH: 6.5 Protein: 1+ mg/dL Cystals: NS (Not Seen)  Glucose: Neg mg/dL Urobilinogen: 0.2 mg/dL Casts: Hyaline   Nitrites: Neg Trichomonas: Not Present   Leukocyte Esterase: 2+ leu/uL Mucous: Not Present    Epithelial Cells: 10 - 20/hpf    Yeast: NS (Not Seen)    Sperm: Not Present   Notes: mixed epithellial components     ASSESSMENT:     ICD-10 Details  1 GU:  Ureteral calculus - N20.1 Acute, Complicated Injury  2  Ureteral obstruction secondary to calculous - N13.2 Right, Acute, Complicated Injury   PLAN:   Orders  Labs Urine Culture  Schedule  Return Visit/Planned Activity: Next Available Appointment - Follow up MD, Schedule Surgery  Document  Letter(s):  Created for Patient: Clinical Summary   Notes:  Her KUB is still inconclusive but the opacity identifies last time which correlated with the prior CT findings of the distal right UVJ calculus, remains in the distal portion of the right hemipelvis. Its size and shape correlates to the stone seen on prior CT imaging. Renal ultrasound today shows prominent right-sided hydronephrosis. This is increased compared to time of her last series of imaging.   She continues with fairly prominent urinary frequency/urgency but that is consistent with her baseline. She is not exhibiting any acute signs of infection or renal colic. Patient would likely benefit best from definitive ureteroscopy. She will also need clearance from cardiology as she has a pacemaker and is on Eliquis.   For  ureteroscopy I described the risks which include heart attack, stroke, pulmonary embolus, death, bleeding, infection, damage to contiguous structures, positioning injury, ureteral stricture, ureteral avulsion, ureteral injury, need for ureteral stent, inability to perform ureteroscopy, need for an interval procedure, inability to clear stone burden, stent discomfort and pain.   I'll discuss with a few of my MD's in regards to getting her set up for next available procedure. All questions answered to the best my ability regarding the pending procedure expected postoperative course with understanding expressed by the patient. She elects to proceed. She will continue to monitor for any new or worsening symptomology including worsening pain/discomfort, correlating constitutional signs/symptoms of systemic infection, or interval stone material passage. Appropriate follow-up instructions to clinic  in the emergency department provided with the above-mentioned symptoms. She will remain on suppressive abx tx.   Reviewed with Dr Alvester Morin, he is agreeable to proceed with URS.   CC Dr Sherron Monday   Signed by Anne Fu, NP on 06/22/23 at 11:02 AM (EDT

## 2023-07-03 NOTE — Op Note (Signed)
Operative Note  Preoperative diagnosis:  1.  Right renal and ureteral calculi  Postoperative diagnosis: 1.  Right renal and ureteral calculi  Procedure(s): 1.  Cystoscopy with right retrograde pyelogram, right ureteroscopy with laser lithotripsy and ureteral stent placement  Surgeon: Modena Slater, MD  Assistants: None  Anesthesia: General  Complications: None immediate  EBL: Minimal  Specimens: 1.  None  Drains/Catheters: 1.  6 x 24 double-J ureteral stent  Intraoperative findings: 1.  Normal urethra 2.  Bladder mucosa with diffuse hypervascularity consistent with prior pelvic radiation 3.  Right ureteroscopy revealed a distal ureteral calculus that was slightly too large to basket extract.  This was dusted to tiny fragments.  The proximal ureter was a little bit tight and therefore upper tract ureteroscopy was not pursued.  Retrograde pyelogram revealed severe hydroureteronephrosis with no filling defect in the ureter after treatment of the stone.  Indication: 77 year old female with a distal right ureteral calculus presents for the previously mentioned operation.  Description of procedure:  The patient was identified and consent was obtained.  The patient was taken to the operating room and placed in the supine position.  The patient was placed under general anesthesia.  Perioperative antibiotics were administered.  The patient was placed in dorsal lithotomy.  Patient was prepped and draped in a standard sterile fashion and a timeout was performed.  A 21 French rigid cystoscope was advanced into the urethra and into the bladder.  Complete cystoscopy was performed with findings noted above.  The right ureter was cannulated with a wire which was advanced up to the kidney under fluoroscopic guidance.  Semirigid ureteroscopy was performed alongside the wire up to a distal stone.  I tried to basket it but the distal ureter was a little too tight and therefore I dropped the stone and  laser fragmented it on dust settings to tiny fragments.  I then advance the scope up the ureter.  The more proximal ureter was tight.  I passed a wire through the scope and up to the kidney and drove the scope over the wire.  I felt a small amount of resistance and therefore I did not force it and withdrew the wire and visualized the ureter upon removal.  There is no ureteral injury and no obvious ureteral calculi was seen from the mid ureter down to the distal ureter.  I advanced up to the mid ureter and shot a retrograde pyelogram through the scope that showed no filling defect in the ureter but severe hydroureteronephrosis.  I move the scope and backloaded wire onto a rigid cystoscope and advanced that into the bladder followed by routine placement of a 6 x 24 double-J ureteral stent.  Fluoroscopy confirmed proximal placement and direct visualization confirmed a good coil within the bladder.  I drained the bladder and withdrew the scope.  Patient tolerated the procedure well was stable postoperatively.  Plan: Options for the patient are either as follows: 1.  Second look ureteroscopy for the small renal stones 2.  She may opt for observation of the renal stones and just have her stent removed at the clinic  Either option is reasonable.

## 2023-07-03 NOTE — Discharge Instructions (Signed)

## 2023-07-03 NOTE — Anesthesia Procedure Notes (Signed)
Procedure Name: LMA Insertion Date/Time: 07/03/2023 4:47 PM  Performed by: Maurene Capes, CRNAPre-anesthesia Checklist: Patient identified, Emergency Drugs available, Suction available and Patient being monitored Patient Re-evaluated:Patient Re-evaluated prior to induction Oxygen Delivery Method: Circle System Utilized Preoxygenation: Pre-oxygenation with 100% oxygen Induction Type: IV induction Ventilation: Mask ventilation without difficulty LMA: LMA inserted LMA Size: 4.0 Number of attempts: 1 Placement Confirmation: positive ETCO2 Tube secured with: Tape Dental Injury: Teeth and Oropharynx as per pre-operative assessment

## 2023-07-03 NOTE — Telephone Encounter (Signed)
CRITICAL VALUE STICKER  CRITICAL VALUE: Ifob positive   RECEIVER (on-site recipient of call): Leslee Suire   DATE & TIME NOTIFIED: 9:31 AM 07/03/23    MESSENGER (representative from lab): Lea   MD NOTIFIED: Dr. Sharen Hones  TIME OF NOTIFICATION: 9:31 AM  RESPONSE:  sending Message to provider. Will await his recommendations.

## 2023-07-03 NOTE — Transfer of Care (Signed)
Immediate Anesthesia Transfer of Care Note  Patient: [name]  Procedure(s) Performed: CYSTOSCOPY RIGHT RETROGRADE PYEPLGRAM RIGHT URETEROSCOPY/HOLMIUM LASER/STENT PLACEMENT (Right)  Patient Location: PACU  Anesthesia Type:General  Level of Consciousness: drowsy and patient cooperative  Airway & Oxygen Therapy: Patient Spontanous Breathing and Patient connected to nasal cannula oxygen  Post-op Assessment: Report given to RN and Post -op Vital signs reviewed and stable  Post vital signs: Reviewed and stable  Last Vitals:  Vitals Value Taken Time  BP 161/84 07/03/23 1734  Temp    Pulse 84 07/03/23 1741  Resp    SpO2 100 % 07/03/23 1741  Vitals shown include unfiled device data.  Last Pain:  Vitals:   07/03/23 1322  TempSrc:   PainSc: 0-No pain         Complications: No notable events documented.

## 2023-07-04 ENCOUNTER — Encounter (HOSPITAL_COMMUNITY): Payer: Self-pay | Admitting: Urology

## 2023-07-04 NOTE — Anesthesia Postprocedure Evaluation (Signed)
Anesthesia Post Note  Patient: [name]  Procedure(s) Performed: CYSTOSCOPY RIGHT RETROGRADE PYEPLGRAM RIGHT URETEROSCOPY/HOLMIUM LASER/STENT PLACEMENT (Right)     Patient location during evaluation: PACU Anesthesia Type: General Level of consciousness: awake and alert, oriented and patient cooperative Pain management: pain level controlled Vital Signs Assessment: post-procedure vital signs reviewed and stable Respiratory status: spontaneous breathing, nonlabored ventilation and respiratory function stable Cardiovascular status: blood pressure returned to baseline and stable Postop Assessment: no apparent nausea or vomiting Anesthetic complications: no   No notable events documented.  Last Vitals:  Vitals:   07/03/23 1830 07/03/23 1844  BP: 125/67   Pulse: 73 71  Resp: 15   Temp:    SpO2: 97% 93%    Last Pain:  Vitals:   07/03/23 1844  TempSrc:   PainSc: 3                  Lannie Fields

## 2023-07-04 NOTE — Addendum Note (Signed)
Addended by: [name] on: 07/04/2023 07:52 AM   Modules accepted: Orders

## 2023-07-04 NOTE — Telephone Encounter (Signed)
Refer to GI. Pt notified via mychart.

## 2023-07-05 ENCOUNTER — Ambulatory Visit: Payer: Medicare Other | Admitting: Physical Therapy

## 2023-07-05 NOTE — Telephone Encounter (Signed)
Lab appointment has been scheduled for 08/01/23 at 0845.

## 2023-07-05 NOTE — Addendum Note (Signed)
Addended by: [name] on: 07/05/2023 01:46 PM   Modules accepted: Orders

## 2023-07-07 ENCOUNTER — Ambulatory Visit: Payer: Medicare Other | Admitting: Physician Assistant

## 2023-07-10 ENCOUNTER — Ambulatory Visit: Payer: Medicare Other | Admitting: Physical Therapy

## 2023-07-12 ENCOUNTER — Encounter: Payer: Self-pay | Admitting: Physical Therapy

## 2023-07-12 ENCOUNTER — Telehealth: Payer: Self-pay | Admitting: Family Medicine

## 2023-07-12 ENCOUNTER — Ambulatory Visit: Payer: Medicare Other | Attending: Family Medicine | Admitting: Physical Therapy

## 2023-07-12 DIAGNOSIS — R2689 Other abnormalities of gait and mobility: Secondary | ICD-10-CM | POA: Diagnosis present

## 2023-07-12 DIAGNOSIS — R262 Difficulty in walking, not elsewhere classified: Secondary | ICD-10-CM | POA: Insufficient documentation

## 2023-07-12 DIAGNOSIS — R269 Unspecified abnormalities of gait and mobility: Secondary | ICD-10-CM | POA: Insufficient documentation

## 2023-07-12 DIAGNOSIS — M6281 Muscle weakness (generalized): Secondary | ICD-10-CM | POA: Diagnosis present

## 2023-07-12 NOTE — Telephone Encounter (Signed)
[facility] at [facility] called stating they are needing a PA for the pt's ABI? Office requested a note to be put in so they won't have to cancel the appt. Call back # [phone]

## 2023-07-12 NOTE — Telephone Encounter (Addendum)
I don't understand what else I need to do - is this something our referral team helps with? 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  Will forward STAT to referral team and to my pool to see what else is needed.

## 2023-07-12 NOTE — Therapy (Signed)
OUTPATIENT PHYSICAL THERAPY NEURO TREATMENT   Patient Name: [name] MRN: [id number] DOB:[birth year], 77 y.o., female Today's Date: 07/12/2023   PCP:   [name], MD   REFERRING PROVIDER:   [name], MD    END OF SESSION:  PT End of Session - 07/12/23 1145     Visit Number 2    Number of Visits 24    Date for PT Re-Evaluation 09/20/23    Progress Note Due on Visit 10    PT Start Time 1146    PT Stop Time 1228    PT Time Calculation (min) 42 min    Equipment Utilized During Treatment Gait belt    Activity Tolerance Patient tolerated treatment well    Behavior During Therapy [facility] for tasks assessed/performed              Past Medical History:  Diagnosis Date   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 Known DISH with thoracic spine fusion and moderate kyphosis, h/o L spine fusion L2-5   Adjustment disorder with depressed mood 10-25-14   38 yo son died MI 10/08/2014 Husband died car accident 04-09-15   Anemia    Anticoagulant long-term use 06/14/2019   She takes warfarin for afib/flutter with CHADS2VASc score of 4 (age, sex, CHF, HTN), s/p 2 bioprosthetic valve replacements Coumadin changed to eliquis 2022 per cardiology   Aortic atherosclerosis 03/03/2022   Noted on 01/2022 chest CT   Arthritis    Arthropathy of spinal facet joint 03/08/2018   Ascending aortic aneurysm 03/02/2021   4.1cm on CT 01/2021 rec yearly monitoring   Bilateral hip pain 09/08/2016   Cancer (HCC)    conon cancer 2012   CAP (community acquired pneumonia) 02/08/2016   Carotid stenosis 12/08/2020   Carotid US - 40-59% BICA stenosis (2015) Carotid US 12/2020 - 1-39% BICA stenosis, incidental 3.4cm structure behind L common carotid artery rec further imaging    Cataracts, bilateral     immature   Chest pain 08/25/2014   myoview low risk 02/2018 ([place])   Chronic cholecystitis with calculus 01/21/2015   S/p cholecystectomy    Chronic diastolic heart failure 09/19/2014   Chronic insomnia    Chronic lower back pain 03/05/2008   Returned to Dr Danielle Dess - translaminar ESI at L3/4. Myelogram showed significant left sided foraminal stenosis at L2/3 and L3/4 in setting of solid arthrodesis, planned DEXA and if stable osteopenia, considering anterolateral decompression with spacer.    CKD (chronic kidney disease) stage 2, GFR 60-89 ml/min 07/03/2015   Constipation    takes Miralax daily as needed   COPD (chronic obstructive pulmonary disease)    Albuterol inhaler daily as needed;Duoneb daily as needed;Spiriva daily   COVID-19 virus infection 11/11/2022   DDD (degenerative disc disease)    cervical - kyphosis with mod DD changes C5/6 and C6/7; lumbar - early DD at L2/3 (Elsner)   Depression    DISH (diffuse idiopathic skeletal hyperostosis) 05/14/2019   By MRI noted fusion of lower thoracic vertebrae - likely contributing to unsteadiness (Elsner)   Dyspnea on exertion 06/05/2015   myoview low risk 02/2018   Edema of both lower legs due to peripheral venous insufficiency    Essential hypertension, benign 03/24/2009   hx of not on nmeds currenlty and has been a while since on meds per pt  Heart murmur    Hematuria 06/17/2021   History of kidney stones    History of radiation therapy 05/30/11 to 07/07/11   rectum   History of rectal cancer 05/26/2011   Rectal cancer 2012, midrectum ypT3ypN0, s/p neoadj chemo&XRT, lap LAR with diverting loop ileostomy 08/2011, ileostomy takedown 03/2012 Discharged from oncology clinic 2017 Truett Perna)   History of shingles    History of vertebral fracture 11/26/2019   Old L1 vertebral compression fracture incidentally noted on imaging 2020    Hyperlipemia    takes Lovastatin daily   Lacunar infarction    Left caudate   Left hip pain 07/29/2020    Legally blind in left eye    Lymphangioma 01/07/2021   Noted incidentally on carotid US 12/2020 CT neck - Likely benign lymphangioma 01/2021   Macrocytosis 03/27/2022   Folate and b12 checked 2023   Malaise and fatigue 01/22/2018   Meralgia paresthetica 12/13/2007   Mixed stress and urge urinary incontinence 03/16/2015   Failed oxybutynin IR/ER. Vesicare initially helped but then no longer helpful.  myrbetriq was not effective.  Saw urology (MacDiarmid) - UDS largely overactive bladder with mild-mod stress incontinence. Improved on abx course - maintained on [place].    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   Pulmonary infiltrates 04/23/2019   06/13/2018-CT super D chest without contrast- generally stable chronic lung disease with pleural parenchymal scarring and scattered nodularity most of these nodules have been tree-in-bud in appearance likely postinfectious or inflammatory the dominant right lower lobe nodule seen on most recent study has resolved no new or enlarging nodules  04/22/2019-CT  chest without contrast- waxing and waning per   Pulmonary nodule 02/23/2012   RLL nodule-59mm stable 2006, April 2009, and June 2009 Small pulm nodules Jan 2012 - > Oct 2012 without change   Rheumatic heart disease mitral stenosis    mod MS by echo 02/2014   Right leg swelling 06/11/2020   R venous US 06/2020 WNL: - No evidence of deep vein thrombosis in the lower extremity. No indirect evidence of obstruction proximal to the inguinal ligament. - No cystic structure found in the popliteal fossa.   S/P aortic and mitral valve bioprostheses 12/08/2013   AVR 21 mm, MVR 25 mm-MagnaEase pericardia   S/P AVR (aortic valve replacement) 2015   bioprosthetic (Bartle)   S/P MVR (mitral valve replacement) 2015   bioprosthetic (Bartle)   Sepsis secondary to UTI (HCC) 08/22/2017   Severe aortic valve stenosis 10/01/2013   mild-mod by echo 02/2014   Skin lesion 01/03/2022   Small bowel obstruction, partial 08/2013   reolved without NGT placement.    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 07/2022   Dr Amy [place]   Syncope 05/28/2018   Thrombocytopenia 11/04/2019   Typical atrial flutter 05/15/2019   Vitamin B12 deficiency 02/28/2015   Start B12 shots 02/2015    Vitamin D deficiency 10/23/2016   Past Surgical History:  Procedure Laterality Date   ANTERIOR LAT LUMBAR FUSION Left 07/17/2018   Procedure: Lumbar Two-Three Lumbar Three-Four Anterolateral decompression/interbody fusion with lateral plate fixation/Infuse;  Surgeon: Barnett Abu, MD;  Location: [facility] OR;  Service: Neurosurgery;  Laterality: Left;  Lumbar Two-Three Lumbar Three-Four Anterolateral decompression/interbody fusion with lateral plate fixation/Infuse   AORTIC VALVE REPLACEMENT N/A 12/12/2013   Procedure: AORTIC VALVE REPLACEMENT (AVR);  Surgeon: Alleen Borne, MD; Service: Open Heart Surgery   BACK SURGERY  2006, 2007   2006 SPACER, 2007 decompression and fusion L4/5   BOWEL RESECTION  04/02/2012   Procedure: SMALL BOWEL  RESECTION;  Surgeon: [name], MD;  Location: [facility] ORS;  Service: General;  Laterality: N/A;   BRONCHIAL WASHINGS  03/24/2021   Procedure: BRONCHIAL WASHINGS;  Surgeon: [name], MD;  Location: [facility] ENDOSCOPY;  Service: Endoscopy;;   CARPAL TUNNEL RELEASE Bilateral    CATARACT EXTRACTION W/ INTRAOCULAR LENS IMPLANT Left 10/18/2016   CATARACT EXTRACTION W/ INTRAOCULAR LENS IMPLANT Right 12/13/2016   Dr. Dagoberto Ligas   CHOLECYSTECTOMY N/A 01/21/2015   chronic cholecystitis, [name], MD   COLON RESECTION  08/25/2011   Procedure: COLON RESECTION LAPAROSCOPIC;  Surgeon: [name], MD;  Location: [facility];  Service: General;  Laterality: N/A;  Laparoscopic Assisted Low Anterior Resection Diverting Ostomy and onQ pain pump   COLONOSCOPY  03/2013   1 polyp, rpt 3 yrs Christella Hartigan)   COLONOSCOPY  05/2021   diverticulosis and patent colon anastomosis with some edema/narrowing, rpt 5 yrs Christella Hartigan)   COLONOSCOPY WITH PROPOFOL N/A 06/09/2016   patent colo-colonic anastomosis, rpt 5 yrs Christella Hartigan)   CYSTOSCOPY/URETEROSCOPY/HOLMIUM LASER/STENT PLACEMENT Right 07/03/2023   Procedure: CYSTOSCOPY RIGHT RETROGRADE PYEPLGRAM RIGHT URETEROSCOPY/HOLMIUM LASER/STENT PLACEMENT;  Surgeon: Crista Elliot, MD;  Location: [facility];  Service: Urology;  Laterality: Right;  1 HR FOR CASE   EP IMPLANTABLE DEVICE N/A 06/16/2016   Procedure: Pacemaker Implant;  Surgeon: Will Jorja Loa, MD;  Location: [facility] INV[facility];  Service: Cardiovascular;  Laterality: N/A;   FOOT SURGERY  left foot   hammer toe   ILEOSTOMY  08/25/2011   ILEOSTOMY CLOSURE  04/02/2012   Procedure: ILEOSTOMY TAKEDOWN;  Surgeon: [name], MD;  Location: [facility];  Service: General;  Laterality: N/A;   INTRAOPERATIVE TRANSESOPHAGEAL ECHOCARDIOGRAM N/A 12/12/2013   Procedure: INTRAOPERATIVE TRANSESOPHAGEAL ECHOCARDIOGRAM;  Surgeon: Alleen Borne, MD;  Location: [facility] OR;  Service: Open Heart Surgery;  Laterality: N/A;   LEFT AND RIGHT HEART  CATHETERIZATION WITH CORONARY ANGIOGRAM N/A 11/27/2013   Procedure: LEFT AND RIGHT HEART CATHETERIZATION WITH CORONARY ANGIOGRAM;  Surgeon: Thurmon Fair, MD;  Location: [facility] CATH LAB;  Service: Cardiovascular;  Laterality: N/A;   MITRAL VALVE REPLACEMENT N/A 12/12/2013   Procedure: MITRAL VALVE (MV) REPLACEMENT OR REPAIR;  Surgeon: Alleen Borne, MD; Service: Open Heart Surgery   TEE WITHOUT CARDIOVERSION N/A 11/29/2013   Procedure: TRANSESOPHAGEAL ECHOCARDIOGRAM (TEE);  Surgeon: Quintella Reichert, MD;  Location: [facility] ENDOSCOPY;  Service: Cardiovascular;  Laterality: N/A;   TEE WITHOUT CARDIOVERSION N/A 06/04/2019   Procedure: TRANSESOPHAGEAL ECHOCARDIOGRAM (TEE);  Surgeon: Thurmon Fair, MD;  Location: [facility] ENDOSCOPY;  Service: Cardiovascular;  Laterality: N/A;   TONSILLECTOMY     VIDEO BRONCHOSCOPY N/A 03/24/2021  Procedure: VIDEO BRONCHOSCOPY WITHOUT FLUORO;  Surgeon: [name], MD;  Location: [facility];  Service: Endoscopy;  Laterality: N/A;  SMALL SCOPE,PREMEDICATE WITH NEBULIZED LIDOCAINE   Patient Active Problem List   Diagnosis Date Noted   Pressure sore on heel 06/15/2023   Lump of skin 06/06/2023   Arterial leg ulcer (HCC) 06/06/2023   BRBPR (bright red blood per rectum) 04/24/2023   Head injury 04/24/2023   Cause of injury, fall 03/13/2023   Imbalance 03/13/2023   Venous ulcer of leg (HCC) 01/24/2023   Fatigue 10/05/2022   Positive ANA (antinuclear antibody) 05/30/2022   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   Lacunar infarction (HCC)    Macrocytosis 03/27/2022   Aortic atherosclerosis 03/03/2022   Peripheral neuropathy 01/03/2022   Hematuria 06/17/2021   Pulmonary artery hypertension 06/17/2021   Pacemaker 03/02/2021   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   Ascending aortic aneurysm (HCC) 03/02/2021   Lymphangioma 01/07/2021   Persistent atrial fibrillation 12/09/2020   Carotid stenosis 12/08/2020   Left hip pain 07/29/2020   Right leg swelling  06/11/2020   History of vertebral fracture 11/26/2019   Acute midline low back pain without sciatica 11/26/2019   Thrombocytopenia 11/04/2019   Anticoagulant long-term use 06/14/2019   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   Typical atrial flutter 05/15/2019   DISH (diffuse idiopathic skeletal hyperostosis) 05/14/2019   Pulmonary infiltrates 04/23/2019   Other spondylosis with radiculopathy, lumbar region 07/17/2018   Syncope 05/28/2018   Arthropathy of spinal facet joint 03/08/2018   Malaise and fatigue 01/22/2018   Pain of right sacroiliac joint 08/09/2017   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 12/31/2016   Vitamin D deficiency 10/23/2016   Bilateral hip pain 09/08/2016   History of colonic polyps    Pedal edema 02/25/2016   Advanced care planning/counseling discussion 10/28/2015   CKD (chronic kidney disease) stage 2, GFR 60-89 ml/min 07/03/2015   Dyspnea on exertion 06/05/2015   Mixed stress and urge urinary incontinence 03/16/2015   Vitamin B12 deficiency 02/28/2015   Chronic insomnia 02/25/2015   Osteoporosis 12/09/2014   Medicare annual wellness visit, subsequent 10/15/2014   Health maintenance examination 10/15/2014   Adjustment disorder with mixed anxiety and depressed mood 10/15/2014   Chronic diastolic heart failure 09/19/2014   Chest pain 08/25/2014   S/P aortic and mitral valve bioprostheses 12/2013   Pulmonary nodules 02/23/2012   History of radiation therapy    Macrocytic anemia 09/21/2011   History of rectal cancer 05/26/2011   Essential hypertension, benign 03/24/2009   Overweight (BMI 25.0-29.9) 03/05/2008   Chronic lower back pain 03/05/2008   Meralgia paresthetica 12/13/2007   GERD (gastroesophageal reflux disease) 08/06/2007   HLD (hyperlipidemia) 05/03/2007   COPD (chronic obstructive pulmonary disease) 05/03/2007    ONSET DATE: 06/10/22  REFERRING DIAG:  M54.50,G89.29 (ICD-10-CM) - Chronic midline low back pain without sciatica  R26.89  (ICD-10-CM) - Imbalance    THERAPY DIAG:  Abnormality of gait and mobility  Difficulty in walking, not elsewhere classified  Muscle weakness (generalized)  Other abnormalities of gait and mobility  Rationale for Evaluation and Treatment: Rehabilitation  SUBJECTIVE:  SUBJECTIVE STATEMENT: Patient reports no significant changes since last session and overall she has been doing well Pt accompanied by: self  PERTINENT HISTORY: Patient has a long history of low back pain, arthritis, COPD, hypertension, compression fracture, hyperlipidemia, visual deficits in the left eye  From Eval: Pt reports with her balance she is falling in scenarios where she begins to feel off balance then she falls. She had a fall when putting out round up and other loss of balance when she was repotting a plant and lost her balance.  Both of her fall she noted were in flexed posture for prolonged period likely resulting in inability to perform adequate extension to get out of this posture resulting in fall.  Pt also has low back pain from " disc fragments" that are in her back. The pain worsens with longer standing. Pt low back pain for years. She cannot go into extension and cannot lay on her stomach.  PAIN:  Are you having pain? Yes: NPRS scale: 5/10 Pain location: low back across both sides.  Pain description: Dull, aching Aggravating factors: standing, walking Relieving factors: sitting ( flexed postures)  PRECAUTIONS: Fall  RED FLAGS: None   WEIGHT BEARING RESTRICTIONS: No  FALLS: Has patient fallen in last 6 months? Yes. Number of falls 2  LIVING ENVIRONMENT: Lives with: lives with their family Lives in: House/apartment Stairs: Yes: Internal: 3 steps; can reach both Has following equipment at home: Single point  cane  PLOF: Independent with basic ADLs  PATIENT GOALS: Improve balance, decrease falls, improve her back pain   OBJECTIVE:   DIAGNOSTIC FINDINGS: From MRI of lumbar spine in June 2024 interpreted by radiologist and impression as below  IMPRESSION: 1. No acute fracture or acute finding. 2. Chronic compression fracture of L1 stable from the prior lumbar spine CT. 3. Postoperative changes from fusions, L2-L3, L3-L4 and L4-L5, also stable from the prior C  COGNITION: Overall cognitive status: Within functional limits for tasks assessed   SENSATION: Not tested   POSTURE: rounded shoulders, decreased lumbar lordosis, increased thoracic kyphosis, and flexed trunk   LOWER EXTREMITY ROM:     MMT Right Eval Left Eval  Hip flexion 3+ 3+  Hip extension    Hip abduction 4- 4-  Hip adduction 4 4  Hip internal rotation    Hip external rotation    Knee flexion 3+ 3+  Knee extension 4   Ankle dorsiflexion 4+ 4+  Ankle plantarflexion    Ankle inversion    Ankle eversion     (Blank rows = not tested)   (Blank rows = not tested)  BED MOBILITY:  Pt does not sleep in the bed, currently sleeps in recliner  TRANSFERS: Assistive device utilized: Single point cane  Sit to stand: Modified independence Stand to sit: Modified independence Chair to chair: Modified independence Floor:  does not attempt   STAIRS: Level of Assistance: SBA Stair Negotiation Technique: Alternating Pattern  with Bilateral Rails Number of Stairs: 4  Height of Stairs: 6 in  Comments:   GAIT: Gait pattern: decreased arm swing- Right, decreased arm swing- Left, decreased step length- Right, decreased step length- Left, decreased stance time- Right, decreased stance time- Left, and trunk flexed Distance walked: 450 ft Assistive device utilized: Walker - 4 wheeled Level of assistance: Modified independence Comments: flexed posture improved with use of rollator but UE fatigued with use of rollator.    FUNCTIONAL TESTS:  5 times sit to stand: 17 sec with B UE, cannot complete  1 without UE assist   Timed up and go (TUG): 14 sec, UE assist   6 minute walk test: 450 ft with clinic rollator, did nit complete 6 minutes limited by back pain  Dynamic Gait Index: 10 14.82 sec no AD      PATIENT SURVEYS:  FOTO 47 RAG of 53  TODAY'S TREATMENT:        Standing row with GTB attached to door handle, instruction in home setup with this. 2 x 10  Horizontal abduction x 2 x 10 with RTB                                                                                                                  Standing NBOS 2 x 30 sec  Standing tandem with UE support x 30 sec ea  Nustep Seat 7 with UE at 7, level 1 x 3 min x 2 sets, fatigue noted requiring rest break at 3 min   When walking to Nu[employer] physical therapist recognized patient was ambulate with narrow base of support likely causing some of her balance difficulties.  Orchestrated the following exercises a result.  Ambulation with 1 foot 1 color tile and other foot on a different color tile for external cue to prevent crossing over centerline of progression with ambulation.  X 4 laps  Ambulation holding onto back a transport chair for approximately 400 feet followed by seated rest and end of session.   Session ended a few minutes early as patient requested to be shown where heart care was that she has an appointment tomorrow.  Physical therapist brought patient in transport chair to heart care and around to [facility] building in order to show patient route for her appointment coming up tomorrow.  Patient reports she was very appreciative of this no billing associated with this  PATIENT EDUCATION: Education details: POC Person educated: Patient Education method: Explanation Education comprehension: verbalized understanding  HOME EXERCISE PROGRAM: Establish visit 2   GOALS: Goals reviewed with patient? Yes  SHORT TERM GOALS: Target  date: 07/27/2023    Patient will be independent in home exercise program to improve strength/mobility for better functional independence with ADLs. Baseline: No HEP currently  Goal status: INITIAL   LONG TERM GOALS: Target date: 09/21/2023   1.  Patient (> 89 years old) will complete five times sit to stand test in < 15 seconds indicating an increased LE strength and improved balance. Baseline: 17 sec with UE assist  Goal status: INITIAL  2.  Patient will increase FOTO score to equal to or greater than  53   to demonstrate statistically significant improvement in mobility and quality of life.  Baseline: 47 Goal status: INITIAL   3.  Patient will increase DGI Balance score by > 6 points to demonstrate decreased fall risk during functional activities. Baseline: 10 Goal status: INITIAL   4.   Patient will reduce timed up and go to <11 seconds to reduce fall risk and demonstrate improved transfer/gait ability. Baseline: 14 sec,  [place] use for STS  Goal status: INITIAL  5.   Patient will increase 10 meter walk test to >1.33m/s as to improve gait speed for better community ambulation and to reduce fall risk. Baseline: .675 m/s no AD  Goal status: INITIAL  6.   Patient will increase six minute walk test distance to >800 without increase in back pain for progression to community ambulator and improve gait ability Baseline: 450 ft with clinic rollator to offset low back pain  Goal status: INITIAL    ASSESSMENT:  CLINICAL IMPRESSION: Patient presents with excellent motivation for completion of physical therapy activities.  Patient provided with initial home exercise program focused on balance and postural exercises.  Patient will benefit from further strengthening and endurance activities as well as balance activities in future physical therapy sessions.Pt will continue to benefit from skilled physical therapy intervention to address impairments, improve QOL, and attain therapy goals.      OBJECTIVE IMPAIRMENTS: Abnormal gait, decreased activity tolerance, decreased balance, decreased endurance, decreased mobility, difficulty walking, decreased ROM, decreased strength, hypomobility, and pain.   ACTIVITY LIMITATIONS: carrying, lifting, standing, sleeping, stairs, transfers, and locomotion level  PARTICIPATION LIMITATIONS: driving, community activity, and yard work  PERSONAL FACTORS: Age, Fitness, Time since onset of injury/illness/exacerbation, and 3+ comorbidities:  arthritis, COPD, hypertension, compression fracture, hyperlipidemia, visual deficits in the left eye  are also affecting patient's functional outcome.   REHAB POTENTIAL: Good  CLINICAL DECISION MAKING: Stable/uncomplicated  EVALUATION COMPLEXITY: Low  PLAN:  PT FREQUENCY: 2x/week  PT DURATION: 12 weeks  PLANNED INTERVENTIONS: Therapeutic exercises, Therapeutic activity, Neuromuscular re-education, Balance training, Gait training, Patient/Family education, Self Care, Joint mobilization, Joint manipulation, Stair training, Dry Needling, and Manual therapy  PLAN FOR NEXT SESSION: HEP, balance, UE strength for walker use, postural strengthening ( post chain), balance    Norman Herrlich, PT 07/12/2023, 11:46 AM

## 2023-07-13 ENCOUNTER — Ambulatory Visit: Payer: Medicare Other | Attending: Family Medicine

## 2023-07-13 ENCOUNTER — Telehealth: Payer: Self-pay | Admitting: Cardiovascular Disease

## 2023-07-13 DIAGNOSIS — L97909 Non-pressure chronic ulcer of unspecified part of unspecified lower leg with unspecified severity: Secondary | ICD-10-CM

## 2023-07-13 DIAGNOSIS — L97921 Non-pressure chronic ulcer of unspecified part of left lower leg limited to breakdown of skin: Secondary | ICD-10-CM | POA: Diagnosis not present

## 2023-07-13 NOTE — Telephone Encounter (Signed)
[name] with Dr. Patrice Paradise' office is on the line. She states she is returning a call regarding an authorization being needed. She states the insurance advised that an authorization is not needed for the ABI--FYI.

## 2023-07-13 NOTE — Telephone Encounter (Signed)
Spoke with [facility] notifying them no PA is required, per [place]. Says they will document and relay info.

## 2023-07-13 NOTE — Telephone Encounter (Signed)
Plz call [facility] at [facility]  at below # to notify them of below. Thanks.

## 2023-07-14 ENCOUNTER — Encounter (HOSPITAL_COMMUNITY): Payer: Self-pay | Admitting: Urology

## 2023-07-14 ENCOUNTER — Other Ambulatory Visit: Payer: Self-pay

## 2023-07-14 ENCOUNTER — Other Ambulatory Visit: Payer: Self-pay | Admitting: Urology

## 2023-07-14 LAB — VAS US ABI WITH/WO TBI
Left ABI: 1.29
Right ABI: 1.25

## 2023-07-16 NOTE — Anesthesia Preprocedure Evaluation (Addendum)
Anesthesia Evaluation  Patient identified by MRN, date of birth, ID band Patient awake    Reviewed: Allergy & Precautions, NPO status , Patient's Chart, lab work & pertinent test results, reviewed documented beta blocker date and time   History of Anesthesia Complications (+) PONV and history of anesthetic complications  Airway Mallampati: II  TM Distance: >3 FB     Dental  (+) Teeth Intact, Dental Advisory Given   Pulmonary sleep apnea (does not use CPAP) , pneumonia, resolved, COPD,  COPD inhaler, former smoker   Pulmonary exam normal breath sounds clear to auscultation       Cardiovascular hypertension, Pt. 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 Jude dual chamber pacemaker 2015  for 2:1 AVB  Pacemaker interrogation 04/2023 Dr. Royann Shivers comments "Complete heart block: Pacemaker dependent. Battery status is good. Lead measurements are stable. Heart rate histogram is favorable. No clinically significant episodes of high ventricular rate or atrial mode switch noted.  Occasional brief episodes of atrial tachycardia, but atrial fibrillation has not been seen since October 2022."  EKG 03/30/23 AV Sequential Paced rhythm  Non healing LLE ucler S/P AVR/MVR 2015 Rheumatic heart disease AS/MS Bilateral ICA stenosis 30-49%   Echo 09/2022  1. Left ventricular ejection fraction, by estimation, is 60 to 65%. The left ventricle has normal function. The left ventricle has no regional wall motion abnormalities. There is moderate concentric left ventricular hypertrophy. Left ventricular diastolic parameters are indeterminate.   2. Right ventricular systolic function is mildly reduced. The right ventricular size is  moderately enlarged. There is moderately elevated pulmonary artery systolic pressure. The estimated right ventricular systolic pressure is 46.7 mmHg.   3. Left atrial size was severely dilated.   4. The mitral valve has been repaired/replaced. Trivial mitral valve regurgitation. There is a 25 mm Magna Ease bioprosthetic valve present in the mitral position. Procedure Date: 12/22/2013. The mean gradient is at HR 74, EOA 1.3cm2. 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   5. The aortic valve has been repaired/replaced. There is a 21 mm bioprosthetic valve present in the aortic position. Procedure Date: 12/22/2013. Echo findings are consistent with normal structure and function of the aortic valve prosthesis. Aortic valve mean gradient measures 14.0 mmHg. Aortic valve Vmax measures 2.56 m/s.  Trivial paravalvular leak   6. Aortic dilatation noted. There is mild dilatation of the ascending aorta, measuring 43 mm.   7. The inferior vena cava is dilated in size with >50% respiratory variability, suggesting right atrial pressure of 8 mmHg.   Comparison(s): Compared to prior TTE on 10/2021, there is no significant change. Prior mean mitral gradient 9.74mmHg, prior mean aortic gradient 12.37mmHg.      5/19 Stress: EF 57%, normal with no evidence of ischemia or prior MI '17 ECHO: EF 60-65%, AVR and MVR functioning normally   Neuro/Psych  PSYCHIATRIC DISORDERS  Depression    Chronic back pain Peripheral neuropathy Legally blind OS  Neuromuscular disease    GI/Hepatic ,GERD  Controlled,,H/o pancreatitis H/o SBO Rectal ca Hx/o Colon resection ileostomy with reversal 2012 S/P RT and ChemoRx   Endo/Other  Osteoporosis Hyperlipidemia  Renal/GU Renal InsufficiencyRenal diseaseRight renal calculus  negative genitourinary   Musculoskeletal  (+) Arthritis , Osteoarthritis,  Back surgery x 2 Chronic back pain   Abdominal   Peds  Hematology  (+)  Blood dyscrasia, anemia Eliquis therapy- last  dose   Anesthesia Other Findings Pt in Aflutter, pacer dependent with underlying ventricular rate 30  Reproductive/Obstetrics                              Anesthesia Physical Anesthesia Plan  ASA: 4  Anesthesia Plan: General   Post-op Pain Management: Ofirmev IV (intra-op)*   Induction: Intravenous  PONV Risk Score and Plan: 4 or greater and Dexamethasone, Ondansetron and Treatment may vary due to age or medical condition  Airway Management Planned: LMA  Additional Equipment: None  Intra-op Plan:   Post-operative Plan: Extubation in OR  Informed Consent: I have reviewed the patients History and Physical, chart, labs and discussed the procedure including the risks, benefits and alternatives for the proposed anesthesia with the patient or authorized representative who has indicated his/her understanding and acceptance.     Dental advisory given  Plan Discussed with: CRNA and Anesthesiologist  Anesthesia Plan Comments: (See PAT note from 9/19 by Sherlie Ban PA-C )         Anesthesia Quick Evaluation

## 2023-07-17 ENCOUNTER — Ambulatory Visit (HOSPITAL_COMMUNITY): Payer: Medicare Other | Admitting: Anesthesiology

## 2023-07-17 ENCOUNTER — Ambulatory Visit (HOSPITAL_COMMUNITY)
Admission: RE | Admit: 2023-07-17 | Discharge: 2023-07-17 | Disposition: A | Discharge status: Home / Self Care | Payer: Medicare Other | Attending: Urology | Admitting: Urology

## 2023-07-17 ENCOUNTER — Encounter (HOSPITAL_COMMUNITY): Payer: Self-pay | Admitting: Urology

## 2023-07-17 ENCOUNTER — Ambulatory Visit: Payer: Medicare Other | Admitting: Physical Therapy

## 2023-07-17 ENCOUNTER — Ambulatory Visit (HOSPITAL_BASED_OUTPATIENT_CLINIC_OR_DEPARTMENT_OTHER): Payer: Medicare Other | Admitting: Anesthesiology

## 2023-07-17 ENCOUNTER — Encounter (HOSPITAL_COMMUNITY)
Admission: RE | Disposition: A | Discharge status: Home / Self Care | Payer: Self-pay | Source: Home / Self Care | Attending: Urology

## 2023-07-17 ENCOUNTER — Ambulatory Visit (HOSPITAL_COMMUNITY): Payer: Medicare Other

## 2023-07-17 DIAGNOSIS — Z87891 Personal history of nicotine dependence: Secondary | ICD-10-CM | POA: Insufficient documentation

## 2023-07-17 DIAGNOSIS — M810 Age-related osteoporosis without current pathological fracture: Secondary | ICD-10-CM | POA: Diagnosis not present

## 2023-07-17 DIAGNOSIS — G629 Polyneuropathy, unspecified: Secondary | ICD-10-CM | POA: Diagnosis not present

## 2023-07-17 DIAGNOSIS — N200 Calculus of kidney: Secondary | ICD-10-CM | POA: Diagnosis present

## 2023-07-17 DIAGNOSIS — I5032 Chronic diastolic (congestive) heart failure: Secondary | ICD-10-CM | POA: Insufficient documentation

## 2023-07-17 DIAGNOSIS — E785 Hyperlipidemia, unspecified: Secondary | ICD-10-CM | POA: Diagnosis not present

## 2023-07-17 DIAGNOSIS — F418 Other specified anxiety disorders: Secondary | ICD-10-CM | POA: Diagnosis not present

## 2023-07-17 DIAGNOSIS — K219 Gastro-esophageal reflux disease without esophagitis: Secondary | ICD-10-CM | POA: Diagnosis not present

## 2023-07-17 DIAGNOSIS — Z7901 Long term (current) use of anticoagulants: Secondary | ICD-10-CM | POA: Diagnosis not present

## 2023-07-17 DIAGNOSIS — G4730 Sleep apnea, unspecified: Secondary | ICD-10-CM | POA: Diagnosis not present

## 2023-07-17 DIAGNOSIS — D696 Thrombocytopenia, unspecified: Secondary | ICD-10-CM

## 2023-07-17 DIAGNOSIS — N189 Chronic kidney disease, unspecified: Secondary | ICD-10-CM | POA: Diagnosis not present

## 2023-07-17 DIAGNOSIS — G8929 Other chronic pain: Secondary | ICD-10-CM | POA: Insufficient documentation

## 2023-07-17 DIAGNOSIS — M549 Dorsalgia, unspecified: Secondary | ICD-10-CM | POA: Insufficient documentation

## 2023-07-17 DIAGNOSIS — I4892 Unspecified atrial flutter: Secondary | ICD-10-CM | POA: Insufficient documentation

## 2023-07-17 DIAGNOSIS — I4719 Other supraventricular tachycardia: Secondary | ICD-10-CM | POA: Diagnosis not present

## 2023-07-17 DIAGNOSIS — M1990 Unspecified osteoarthritis, unspecified site: Secondary | ICD-10-CM | POA: Diagnosis not present

## 2023-07-17 DIAGNOSIS — R319 Hematuria, unspecified: Secondary | ICD-10-CM

## 2023-07-17 DIAGNOSIS — J449 Chronic obstructive pulmonary disease, unspecified: Secondary | ICD-10-CM | POA: Insufficient documentation

## 2023-07-17 DIAGNOSIS — I739 Peripheral vascular disease, unspecified: Secondary | ICD-10-CM | POA: Insufficient documentation

## 2023-07-17 DIAGNOSIS — D649 Anemia, unspecified: Secondary | ICD-10-CM | POA: Insufficient documentation

## 2023-07-17 DIAGNOSIS — H548 Legal blindness, as defined in USA: Secondary | ICD-10-CM | POA: Diagnosis not present

## 2023-07-17 DIAGNOSIS — I130 Hypertensive heart and chronic kidney disease with heart failure and stage 1 through stage 4 chronic kidney disease, or unspecified chronic kidney disease: Secondary | ICD-10-CM | POA: Diagnosis not present

## 2023-07-17 DIAGNOSIS — I10 Essential (primary) hypertension: Secondary | ICD-10-CM

## 2023-07-17 DIAGNOSIS — I6523 Occlusion and stenosis of bilateral carotid arteries: Secondary | ICD-10-CM | POA: Diagnosis not present

## 2023-07-17 DIAGNOSIS — I442 Atrioventricular block, complete: Secondary | ICD-10-CM | POA: Diagnosis not present

## 2023-07-17 DIAGNOSIS — I272 Pulmonary hypertension, unspecified: Secondary | ICD-10-CM | POA: Insufficient documentation

## 2023-07-17 DIAGNOSIS — N182 Chronic kidney disease, stage 2 (mild): Secondary | ICD-10-CM | POA: Insufficient documentation

## 2023-07-17 DIAGNOSIS — Z87442 Personal history of urinary calculi: Secondary | ICD-10-CM | POA: Insufficient documentation

## 2023-07-17 HISTORY — DX: Unspecified multiple injuries, initial encounter: T07.XXXA

## 2023-07-17 HISTORY — PX: CYSTOSCOPY/URETEROSCOPY/HOLMIUM LASER/STENT PLACEMENT: SHX6546

## 2023-07-17 HISTORY — DX: Other fecal abnormalities: R19.5

## 2023-07-17 LAB — BASIC METABOLIC PANEL
Anion gap: 10 (ref 5–15)
BUN: 19 mg/dL (ref 8–23)
CO2: 26 mmol/L (ref 22–32)
Calcium: 9.1 mg/dL (ref 8.9–10.3)
Chloride: 103 mmol/L (ref 98–111)
Creatinine, Ser: 0.98 mg/dL (ref 0.44–1.00)
GFR, Estimated: 59 mL/min — ABNORMAL LOW (ref >60)
Glucose, Bld: 88 mg/dL (ref 70–99)
Potassium: 4 mmol/L (ref 3.5–5.1)
Sodium: 139 mmol/L (ref 135–145)

## 2023-07-17 LAB — CBC
HCT: 38.2 % (ref 36.0–46.0)
Hemoglobin: 11.5 g/dL — ABNORMAL LOW (ref 12.0–15.0)
MCH: 33.1 pg (ref 26.0–34.0)
MCHC: 30.1 g/dL (ref 30.0–36.0)
MCV: 110.1 fL — ABNORMAL HIGH (ref 80.0–100.0)
Platelets: 112 10*3/uL — ABNORMAL LOW (ref 150–400)
RBC: 3.47 MIL/uL — ABNORMAL LOW (ref 3.87–5.11)
RDW: 14.7 % (ref 11.5–15.5)
WBC: 7.8 10*3/uL (ref 4.0–10.5)
nRBC: 0 % (ref 0.0–0.2)

## 2023-07-17 SURGERY — CYSTOSCOPY/URETEROSCOPY/HOLMIUM LASER/STENT PLACEMENT
Anesthesia: General | Site: Ureter | Laterality: Right

## 2023-07-17 MED ORDER — ONDANSETRON HCL 4 MG/2ML IJ SOLN
INTRAMUSCULAR | Status: AC
  Filled 2023-07-17: qty 2

## 2023-07-17 MED ORDER — PHENYLEPHRINE 80 MCG/ML (10ML) SYRINGE FOR IV PUSH (FOR BLOOD PRESSURE SUPPORT)
PREFILLED_SYRINGE | INTRAVENOUS | Status: AC
  Filled 2023-07-17: qty 10

## 2023-07-17 MED ORDER — OXYCODONE HCL 5 MG PO TABS: 5.0000 mg | ORAL_TABLET | Freq: Once | ORAL | Status: DC | PRN

## 2023-07-17 MED ORDER — FENTANYL CITRATE (PF) 100 MCG/2ML IJ SOLN
INTRAMUSCULAR | Status: DC | PRN
  Administered 2023-07-17 (×2): 25 ug via INTRAVENOUS

## 2023-07-17 MED ORDER — 0.9 % SODIUM CHLORIDE (POUR BTL) OPTIME
TOPICAL | Status: DC | PRN
Start: 2023-07-17 — End: 2023-07-17
  Administered 2023-07-17: 1000 mL

## 2023-07-17 MED ORDER — PROPOFOL 10 MG/ML IV BOLUS
INTRAVENOUS | Status: DC | PRN
  Administered 2023-07-17: 140 mg via INTRAVENOUS

## 2023-07-17 MED ORDER — DEXAMETHASONE SODIUM PHOSPHATE 4 MG/ML IJ SOLN
INTRAMUSCULAR | Status: DC | PRN
Start: 2023-07-17 — End: 2023-07-17
  Administered 2023-07-17: 4 mg via INTRAVENOUS

## 2023-07-17 MED ORDER — PROPOFOL 10 MG/ML IV BOLUS
INTRAVENOUS | Status: AC
  Filled 2023-07-17: qty 20

## 2023-07-17 MED ORDER — ORAL CARE MOUTH RINSE: 15.0000 mL | Freq: Once | OROMUCOSAL | Status: AC

## 2023-07-17 MED ORDER — OXYCODONE HCL 5 MG/5ML PO SOLN: 5.0000 mg | Freq: Once | ORAL | Status: DC | PRN

## 2023-07-17 MED ORDER — HYDROMORPHONE HCL 1 MG/ML IJ SOLN: 0.2500 mg | INTRAMUSCULAR | Status: DC | PRN

## 2023-07-17 MED ORDER — LACTATED RINGERS IV SOLN: INTRAVENOUS | Status: DC

## 2023-07-17 MED ORDER — LIDOCAINE HCL (CARDIAC) PF 100 MG/5ML IV SOSY
PREFILLED_SYRINGE | INTRAVENOUS | Status: DC | PRN
  Administered 2023-07-17: 60 mg via INTRAVENOUS

## 2023-07-17 MED ORDER — ONDANSETRON HCL 4 MG/2ML IJ SOLN: 4.0000 mg | Freq: Once | INTRAMUSCULAR | Status: DC | PRN

## 2023-07-17 MED ORDER — SODIUM CHLORIDE 0.9 % IR SOLN
Status: DC | PRN
  Administered 2023-07-17: 3000 mL

## 2023-07-17 MED ORDER — ONDANSETRON HCL 4 MG/2ML IJ SOLN
INTRAMUSCULAR | Status: DC | PRN
  Administered 2023-07-17: 4 mg via INTRAVENOUS

## 2023-07-17 MED ORDER — CHLORHEXIDINE GLUCONATE 0.12 % MT SOLN
15.0000 mL | Freq: Once | OROMUCOSAL | Status: AC
  Administered 2023-07-17: 15 mL via OROMUCOSAL

## 2023-07-17 MED ORDER — FENTANYL CITRATE (PF) 100 MCG/2ML IJ SOLN
INTRAMUSCULAR | Status: AC
  Filled 2023-07-17: qty 2

## 2023-07-17 MED ORDER — IOHEXOL 300 MG/ML  SOLN
INTRAMUSCULAR | Status: DC | PRN
  Administered 2023-07-17: 10 mL

## 2023-07-17 MED ORDER — PHENYLEPHRINE HCL (PRESSORS) 10 MG/ML IV SOLN
INTRAVENOUS | Status: DC | PRN
Start: 2023-07-17 — End: 2023-07-17
  Administered 2023-07-17 (×3): 80 ug via INTRAVENOUS

## 2023-07-17 MED ORDER — DEXAMETHASONE SODIUM PHOSPHATE 10 MG/ML IJ SOLN
INTRAMUSCULAR | Status: AC
  Filled 2023-07-17: qty 1

## 2023-07-17 MED ORDER — AMISULPRIDE (ANTIEMETIC) 5 MG/2ML IV SOLN: 10.0000 mg | Freq: Once | INTRAVENOUS | Status: DC | PRN

## 2023-07-17 MED ORDER — CEFAZOLIN SODIUM-DEXTROSE 2-4 GM/100ML-% IV SOLN
2.0000 g | INTRAVENOUS | Status: AC
  Administered 2023-07-17: 2 g via INTRAVENOUS
  Filled 2023-07-17: qty 100

## 2023-07-17 MED ORDER — LACTATED RINGERS IV SOLN: INTRAVENOUS | Status: DC | PRN

## 2023-07-17 SURGICAL SUPPLY — 27 items
BAG URO CATCHER STRL LF (MISCELLANEOUS) ×1 IMPLANT
BASKET LASER NITINOL 1.9FR (BASKET) IMPLANT
BASKET ZERO TIP NITINOL 2.4FR (BASKET) IMPLANT
BSKT STON RTRVL 120 1.9FR (BASKET)
BSKT STON RTRVL ZERO TP 2.4FR (BASKET) ×1
CATH URETERAL DUAL LUMEN 10F (MISCELLANEOUS) IMPLANT
CATH URETL OPEN END 6FR 70 (CATHETERS) ×1 IMPLANT
CLOTH BEACON ORANGE TIMEOUT ST (SAFETY) ×1 IMPLANT
EXTRACTOR STONE 1.7FRX115CM (UROLOGICAL SUPPLIES) IMPLANT
GLOVE BIO SURGEON STRL SZ7.5 (GLOVE) ×1 IMPLANT
GOWN STRL REUS W/ TWL XL LVL3 (GOWN DISPOSABLE) ×1 IMPLANT
GOWN STRL REUS W/TWL XL LVL3 (GOWN DISPOSABLE) ×1
GUIDEWIRE ANG ZIPWIRE 038X150 (WIRE) IMPLANT
GUIDEWIRE STR DUAL SENSOR (WIRE) ×1 IMPLANT
KIT TURNOVER KIT A (KITS) IMPLANT
LASER FIB FLEXIVA PULSE ID 365 (Laser) IMPLANT
MANIFOLD NEPTUNE II (INSTRUMENTS) ×1 IMPLANT
PACK CYSTO (CUSTOM PROCEDURE TRAY) ×1 IMPLANT
PAD PREP 24X48 CUFFED NSTRL (MISCELLANEOUS) ×1 IMPLANT
SHEATH NAVIGATOR HD 11/13X28 (SHEATH) IMPLANT
SHEATH NAVIGATOR HD 11/13X36 (SHEATH) IMPLANT
SHEATH NAVIGATOR HD 12/14X36 (SHEATH) IMPLANT
STENT URET 6FRX24 CONTOUR (STENTS) IMPLANT
TRACTIP FLEXIVA PULS ID 200XHI (Laser) IMPLANT
TRACTIP FLEXIVA PULSE ID 200 (Laser)
TUBING CONNECTING 10 (TUBING) ×1 IMPLANT
TUBING UROLOGY SET (TUBING) ×1 IMPLANT

## 2023-07-17 NOTE — Anesthesia Procedure Notes (Signed)
Procedure Name: LMA Insertion Date/Time: 07/17/2023 9:50 AM  Performed by: Deri Fuelling, CRNAPre-anesthesia Checklist: Patient identified, Emergency Drugs available, Suction available and Patient being monitored Patient Re-evaluated:Patient Re-evaluated prior to induction Oxygen Delivery Method: Circle system utilized Preoxygenation: Pre-oxygenation with 100% oxygen Induction Type: IV induction Ventilation: Mask ventilation without difficulty LMA Size: 4.0 Tube type: Oral Number of attempts: 1 Airway Equipment and Method: Stylet and Oral airway Placement Confirmation: ETT inserted through vocal cords under direct vision, positive ETCO2 and breath sounds checked- equal and bilateral Tube secured with: Tape Dental Injury: Teeth and Oropharynx as per pre-operative assessment

## 2023-07-17 NOTE — Op Note (Signed)
Operative Note  Preoperative diagnosis:  1.  Right renal calculus  Postoperative diagnosis: 1.  Right renal calculus  Procedure(s): 1.  Cystoscopy with right retrograde pyelogram, right ureteroscopy with stone extraction, ureteral stent exchange  Surgeon: Modena Slater, MD  Assistants: None  Anesthesia: General  Complications: None immediate  EBL: Minimal  Specimens: 1.  Renal calculus  Drains/Catheters: 1.  6 x 24 double-J ureteral stent  Intraoperative findings: 1.  Normal urethra and bladder 2.  2 small renal calculi basket extracted.  All stones removed.  Retrograde pyelogram revealed no filling defect or hydronephrosis  Indication: 77 year old female status post right ureteroscopy with laser lithotripsy and ureteral stent placement.  I was unable to access the kidney and therefore she presents for staged ureteroscopy.  Description of procedure:  The patient was identified and consent was obtained.  The patient was taken to the operating room and placed in the supine position.  The patient was placed under general anesthesia.  Perioperative antibiotics were administered.  The patient was placed in dorsal lithotomy.  Patient was prepped and draped in a standard sterile fashion and a timeout was performed.  A 21 French rigid cystoscope was advanced into the urethra and into the bladder.  Complete cystoscopy was performed with no abnormal findings.  The stent was grasped and pulled beyond the urethral meatus.  A wire was advanced through the stent and into the kidney and the stent was withdrawn.  A 12 x 14 ureteral access sheath was advanced over the wire under continuous fluoroscopic guidance up to the proximal ureter.  Inner sheath was withdrawn.  Second wire was advanced through the sheath and into the kidney and the sheath was removed.  1 the wires was secured to the drape as a safety wire.  The other wire was used to advance the 12 x 14 access sheath over it under continuous  fluoroscopic guidance to the proximal ureter.  Inner sheath and wire were withdrawn.  Digital ureteroscopy was performed and 2 stones were basket extracted.  I inspected the remainder of the kidney and no obvious calculi were seen.  I shot a retrograde pyelogram through the scope with no abnormal findings.  I withdrew the scope along with the access sheath visualizing the ureter upon removal.  There was no ureteral injury and no ureteral stone.  I backloaded the wire onto rigid cystoscope and advanced that into the bladder followed by routine placement of a 6 x 24 double-J ureteral stent.  Fluoroscopy confirmed proximal placement and direct visualization confirmed a good coil within the bladder.  I drained the bladder withdrew the scope.  Patient tolerated the procedure well was stable postoperatively.  Plan: Follow-up in 1 week for stent removal

## 2023-07-17 NOTE — Anesthesia Postprocedure Evaluation (Signed)
Anesthesia Post Note  Patient: [name]  Procedure(s) Performed: CYSTOSCOPY/RIGHT URETEROSCOPY/STENT EXCHANGE (Right: Ureter)     Patient location during evaluation: PACU Anesthesia Type: General Level of consciousness: awake and alert and oriented Pain management: pain level controlled Vital Signs Assessment: post-procedure vital signs reviewed and stable Respiratory status: spontaneous breathing, nonlabored ventilation and respiratory function stable Cardiovascular status: blood pressure returned to baseline and stable Postop Assessment: no apparent nausea or vomiting Anesthetic complications: no   No notable events documented.  Last Vitals:  Vitals:   07/17/23 1045 07/17/23 1100  BP: 136/70 138/65  Pulse: 78 75  Resp: 13 15  Temp:    SpO2: 100% 100%    Last Pain:  Vitals:   07/17/23 1100  TempSrc:   PainSc: 0-No pain                 Aseel Truxillo A.

## 2023-07-17 NOTE — H&P (Signed)
H&P Chief Complaint: Right renal calculus  History of Present Illness: 77 year old female status post right ureteroscopy with laser lithotripsy and ureteral stent placement.  Unable to access the kidney for upper tract ureteroscopy and she presents for staged ureteroscopy.  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 Known DISH with thoracic spine fusion and moderate kyphosis, h/o L spine fusion L2-5   Adjustment disorder with depressed mood Nov 08, 2014   65 yo son died MI 21-Oct-2014 Husband died car accident 22-Apr-2015   Anemia    Anticoagulant long-term use 06/14/2019   She takes warfarin for afib/flutter with CHADS2VASc score of 4 (age, sex, CHF, HTN), s/p 2 bioprosthetic valve replacements Coumadin changed to eliquis 2022 per cardiology   Aortic atherosclerosis 03/03/2022   Noted on 01/2022 chest CT   Arthritis    Arthropathy of spinal facet joint 03/08/2018   Ascending aortic aneurysm 03/02/2021   4.1cm on CT 01/2021 rec yearly monitoring   Bilateral hip pain 09/08/2016   Cancer (HCC)    conon cancer 2012   CAP (community acquired pneumonia) 02/08/2016   Carotid stenosis 12/08/2020   Carotid US - 40-59% BICA stenosis (2015) Carotid US 12/2020 - 1-39% BICA stenosis, incidental 3.4cm structure behind L common carotid artery rec further imaging    Cataracts, bilateral    immature   Chest pain 08/25/2014   myoview low risk 02/2018 ([facility])   Chronic cholecystitis with calculus 01/21/2015   S/p cholecystectomy    Chronic diastolic heart failure 09/19/2014   Chronic insomnia    Chronic lower back pain 03/05/2008   Returned to Dr Danielle Dess - translaminar ESI at L3/4. Myelogram showed significant left sided foraminal stenosis at L2/3 and L3/4 in setting of solid arthrodesis,  planned DEXA and if stable osteopenia, considering anterolateral decompression with spacer.    CKD (chronic kidney disease) stage 2, GFR 60-89 ml/min 07/03/2015   Constipation    takes Miralax daily as needed   COPD (chronic obstructive pulmonary disease)    Albuterol inhaler daily as needed;Duoneb daily as needed;Spiriva daily   COVID-19 virus infection 11/11/2022   DDD (degenerative disc disease)    cervical - kyphosis with mod DD changes C5/6 and C6/7; lumbar - early DD at L2/3 (Elsner)   Depression    DISH (diffuse idiopathic skeletal hyperostosis) 05/14/2019   By MRI noted fusion of lower thoracic vertebrae - likely contributing to unsteadiness (Elsner)   Dyspnea on exertion 06/05/2015   myoview low risk 02/2018   Edema of both lower legs due to peripheral venous insufficiency    Essential hypertension, benign 03/24/2009   hx of not on nmeds currenlty and has been a while since on meds per pt   Heart murmur    Hematuria 06/17/2021   History of kidney stones    History of radiation therapy 05/30/11 to 07/07/11   rectum   History of rectal cancer 05/26/2011   Rectal cancer 2012, midrectum [id number], s/p neoadj chemo&XRT, lap LAR with diverting loop ileostomy 08/2011, ileostomy takedown 21-Apr-2012 Discharged from oncology clinic 2017 Truett Perna)   History of shingles    History of vertebral fracture 11/26/2019   Old L1 vertebral compression fracture incidentally noted on imaging 2020    Hyperlipemia    takes Lovastatin daily   Lacunar infarction    Left caudate  Left hip pain 07/29/2020   Legally blind in left eye    Lymphangioma 01/07/2021   Noted incidentally on carotid US 12/2020 CT neck - Likely benign lymphangioma 01/2021   Macrocytosis 03/27/2022   Folate and b12 checked 2023   Malaise and fatigue 01/22/2018   Meralgia paresthetica 12/13/2007   Mixed stress and urge urinary incontinence 03/16/2015   Failed oxybutynin IR/ER. Vesicare initially helped but then no longer helpful.   myrbetriq was not effective.  Saw urology (MacDiarmid) - UDS largely overactive bladder with mild-mod stress incontinence. Improved on abx course - maintained on [place].    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   Pulmonary infiltrates 04/23/2019   06/13/2018-CT super D chest without contrast- generally stable chronic lung disease with pleural parenchymal scarring and scattered nodularity most of these nodules have been tree-in-bud in appearance likely postinfectious or inflammatory the dominant right lower lobe nodule seen on most recent study has resolved no new or enlarging nodules  04/22/2019-CT chest without contrast- waxing and waning per   Pulmonary nodule 02/23/2012   RLL nodule-60mm stable 2006, April 2009, and June 2009 Small pulm nodules Jan 2012 - > Oct 2012 without change   Rheumatic heart disease mitral stenosis    mod MS by echo 02/2014   Right leg swelling 06/11/2020   R venous US 06/2020 WNL: - No evidence of deep vein thrombosis in the lower  extremity. No indirect evidence of obstruction proximal to the inguinal ligament. - No cystic structure found in the popliteal fossa.   S/P aortic and mitral valve bioprostheses 12/08/2013   AVR 21 mm, MVR 25 mm-MagnaEase pericardia   S/P AVR (aortic valve replacement) 2015   bioprosthetic (Bartle)   S/P MVR (mitral valve replacement) 2015   bioprosthetic (Bartle)   Sepsis secondary to UTI (HCC) 08/22/2017   Severe aortic valve stenosis 10/01/2013   mild-mod by echo 02/2014   Skin lesion 01/03/2022   Small bowel obstruction, partial 08/2013   reolved without NGT placement.    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   Dr Amy [place]   Syncope 05/28/2018   Thrombocytopenia 11/04/2019   Typical atrial flutter 05/15/2019   Vitamin B12 deficiency 02/28/2015   Start B12 shots 02/2015    Vitamin D deficiency 10/23/2016   Wounds, multiple    bilateral legs   Past Surgical History:  Procedure Laterality Date   ANTERIOR LAT LUMBAR FUSION Left 07/17/2018   Procedure: Lumbar Two-Three Lumbar Three-Four Anterolateral decompression/interbody fusion with lateral plate fixation/Infuse;  Surgeon: Barnett Abu, MD;  Location: [facility];  Service: Neurosurgery;  Laterality: Left;  Lumbar Two-Three Lumbar Three-Four Anterolateral decompression/interbody fusion with lateral plate fixation/Infuse   AORTIC VALVE REPLACEMENT N/A 12/12/2013   Procedure: AORTIC VALVE REPLACEMENT (AVR);  Surgeon: Alleen Borne, MD; Service: Open Heart Surgery   BACK SURGERY  2006, 2007   2006 SPACER, 2007 decompression and fusion L4/5   BOWEL RESECTION  04/02/2012   Procedure: SMALL BOWEL RESECTION;  Surgeon: [name], MD;  Location: [facility];  Service: General;  Laterality: N/A;   BRONCHIAL WASHINGS  03/24/2021   Procedure: BRONCHIAL WASHINGS;  Surgeon: [name], MD;  Location: [facility];  Service: Endoscopy;;   CARPAL TUNNEL RELEASE Bilateral    CATARACT EXTRACTION W/ INTRAOCULAR LENS IMPLANT  Left 10/18/2016   CATARACT EXTRACTION W/ INTRAOCULAR LENS IMPLANT Right 12/13/2016   Dr. Dagoberto Ligas   CHOLECYSTECTOMY N/A 01/21/2015   chronic cholecystitis, [name], MD   COLON RESECTION  08/25/2011   Procedure: COLON RESECTION LAPAROSCOPIC;  Surgeon: [name], MD;  Location: [facility];  Service: General;  Laterality: N/A;  Laparoscopic Assisted Low Anterior Resection Diverting Ostomy and onQ pain pump   COLONOSCOPY  03/2013   1 polyp, rpt 3 yrs Christella Hartigan)   COLONOSCOPY  05/2021   diverticulosis and patent colon anastomosis with some edema/narrowing, rpt 5 yrs Christella Hartigan)   COLONOSCOPY WITH PROPOFOL N/A 06/09/2016   patent colo-colonic anastomosis, rpt 5 yrs Christella Hartigan)   CYSTOSCOPY/URETEROSCOPY/HOLMIUM LASER/STENT PLACEMENT Right 07/03/2023   Procedure: CYSTOSCOPY RIGHT RETROGRADE PYEPLGRAM RIGHT URETEROSCOPY/HOLMIUM LASER/STENT PLACEMENT;  Surgeon: Crista Elliot, MD;  Location: [facility];  Service: Urology;  Laterality: Right;  1 HR FOR CASE   EP IMPLANTABLE DEVICE N/A 06/16/2016   Procedure: Pacemaker Implant;  Surgeon: Will Jorja Loa, MD;  Location: [facility];  Service: Cardiovascular;  Laterality: N/A;   FOOT SURGERY  left foot   hammer toe   ILEOSTOMY  08/25/2011   ILEOSTOMY CLOSURE  04/02/2012   Procedure: ILEOSTOMY TAKEDOWN;  Surgeon: [name], MD;  Location: [facility];  Service: General;  Laterality: N/A;   INTRAOPERATIVE TRANSESOPHAGEAL ECHOCARDIOGRAM N/A 12/12/2013   Procedure: INTRAOPERATIVE TRANSESOPHAGEAL ECHOCARDIOGRAM;  Surgeon: Alleen Borne, MD;  Location: [facility];  Service: Open Heart Surgery;  Laterality: N/A;   LEFT AND RIGHT HEART CATHETERIZATION WITH CORONARY ANGIOGRAM N/A 11/27/2013   Procedure: LEFT AND RIGHT HEART CATHETERIZATION WITH CORONARY ANGIOGRAM;  Surgeon: Thurmon Fair, MD;  Location: [facility];  Service: Cardiovascular;  Laterality: N/A;   MITRAL VALVE REPLACEMENT N/A 12/12/2013   Procedure: MITRAL VALVE (MV) REPLACEMENT OR REPAIR;   Surgeon: Alleen Borne, MD; Service: Open Heart Surgery   TEE WITHOUT CARDIOVERSION N/A 11/29/2013   Procedure: TRANSESOPHAGEAL ECHOCARDIOGRAM (TEE);  Surgeon: Quintella Reichert, MD;  Location: [facility] ENDOSCOPY;  Service: Cardiovascular;  Laterality: N/A;   TEE WITHOUT CARDIOVERSION N/A 06/04/2019   Procedure: TRANSESOPHAGEAL ECHOCARDIOGRAM (TEE);  Surgeon: Thurmon Fair, MD;  Location: [facility] END[facility]COPY;  Service: Cardiovascular;  Laterality: N/A;   TONSILLECTOMY     VIDEO BRONCHOSCOPY N/A 03/24/2021   Procedure: VIDEO BRONCHOSCOPY WITHOUT FLUORO;  Surgeon: [name], MD;  Location: [facility];  Service: Endoscopy;  Laterality: N/A;  SMALL SCOPE,PREMEDICATE WITH NEBULIZED LIDOCAINE    Home Medications:  Medications Prior to Admission  Medication Sig Dispense Refill Last Dose   acetaminophen (TYLENOL) 500 MG tablet Take 2 tablets (1,000 mg total) by mouth daily.   07/17/2023 at 0630   acidophilus (RISAQUAD) CAPS capsule Take 1 capsule by mouth daily.   Past Month   albuterol (VENTOLIN HFA) 108 (  90 Base) MCG/ACT inhaler Inhale 2 puffs into the lungs every 6 (six) hours as needed for wheezing. 18 g 6 Past Month   alendronate (FOSAMAX) 70 MG tablet Take 1 tablet (70 mg total) by mouth every 7 (seven) days. Take with a full glass of water on an empty stomach. 4 tablet 11 Past Month   ascorbic acid (VITAMIN C) 500 MG tablet Take 1 tablet (500 mg total) by mouth daily.   Past Month   Biotin 1000 MCG tablet Take 1,000 mcg by mouth daily.   Past Month   buPROPion (WELLBUTRIN SR) 150 MG 12 hr tablet Take 1 tablet (150 mg total) by mouth every morning. 90 tablet 1 07/17/2023 at 0630   carboxymethylcellulose (REFRESH PLUS) 0.5 % SOLN Place 1 drop into both eyes 3 (three) times daily as needed (dry eyes).   Past Month   cholecalciferol (VITAMIN D3) 25 MCG (1000 UNIT) tablet Take 1,000 Units by mouth daily.   Past Month   docusate sodium (COLACE) 100 MG capsule Take 200 mg by mouth every evening.   Past  Month   ELIQUIS 5 MG TABS tablet TAKE 1 TABLET(5 MG) BY MOUTH TWICE DAILY 180 tablet 1 07/14/2023 at 0900   Fluticasone-Umeclidin-Vilant (TRELEGY ELLIPTA) 200-62.5-25 MCG/ACT AEPB Inhale 1 puff into the lungs daily. 60 each 11 07/17/2023   furosemide (LASIX) 40 MG tablet Take 1 tablet (40 mg total) by mouth daily. May take an extra tablet daily as needed for swelling. 135 tablet 2 Past Week   gabapentin (NEURONTIN) 100 MG capsule Take 1 capsule (100 mg total) by mouth 2 (two) times daily as needed (back pain - for daytime use). 60 capsule 1 Past Week   gabapentin (NEURONTIN) 300 MG capsule Take 300 mg by mouth at bedtime.   Past Week   lovastatin (MEVACOR) 40 MG tablet TAKE 1 TABLET(40 MG) BY MOUTH AT BEDTIME 90 tablet 1 Past Week   montelukast (SINGULAIR) 10 MG tablet TAKE 1 TABLET(10 MG) BY MOUTH AT BEDTIME 90 tablet 1 Past Week   Multiple Vitamins-Minerals (PRESERVISION AREDS 2 PO) Take 2 capsules by mouth every evening.    Past Week   Phenazopyrid-Cranbry-C-Probiot (AZO URINARY TRACT SUPPORT PO) Take 1 capsule by mouth daily.   Past Month   polyethylene glycol (MIRALAX / GLYCOLAX) packet Take 17 g by mouth daily. 14 each 0 Past Month   potassium chloride (KLOR-CON) 10 MEQ tablet TAKE 2 TABLETS(20 MEQ) BY MOUTH DAILY 180 tablet 1 Past Month   psyllium (REGULOID) 0.52 g capsule Take 0.52 g by mouth daily.   Past Week   traMADol (ULTRAM) 50 MG tablet TAKE 1 TABLET(50 MG) BY MOUTH TWICE DAILY AS NEEDED 60 tablet 3 07/17/2023   traZODone (DESYREL) 50 MG tablet TAKE 1/2 TO 1 TABLET(25 TO 50 MG) BY MOUTH AT BEDTIME 30 tablet 6 07/16/2023   Vibegron (GEMTESA) 75 MG TABS Take 75 mg by mouth daily.   07/16/2023   vitamin B-12 (CYANOCOBALAMIN) 500 MCG tablet Take 1 tablet (500 mcg total) by mouth 2 (two) times a week.   Past Week   Zinc 50 MG CAPS Take 1 capsule (50 mg total) by mouth daily.  0 Past Week   calcium carbonate (OSCAL) 1500 (600 Ca) MG TABS tablet Take 600 mg of elemental calcium by mouth daily.       cephALEXin (KEFLEX) 250 MG capsule Take 500 mg by mouth 3 (three) times daily.      hydrocortisone (ANUSOL-HC) 2.5 % rectal cream Place 1  Application rectally 2 (two) times daily. 30 g 0    ipratropium-albuterol (DUONEB) 0.5-2.5 (3) MG/3ML SOLN Take 3 mLs by nebulization every 6 (six) hours as needed. 360 mL 0 More than a month   ondansetron (ZOFRAN-ODT) 4 MG disintegrating tablet Take 1 tablet (4 mg total) by mouth every 8 (eight) hours as needed for nausea or vomiting. 20 tablet 1 More than a month   Allergies:  Allergies  Allergen Reactions   Codeine Nausea Only   Prednisone Nausea Only and Other (See Comments)    In different places all over the body.   Sulfa Antibiotics Nausea Only    Family History  Problem Relation Age of Onset   Heart disease Mother    Diabetes Mother    Hypertension Mother    Stroke Mother    Lung cancer Father    Breast cancer Maternal Aunt 41   Heart attack Maternal Grandfather    CAD Son 48   Heart attack Son 70   Rectal cancer Neg Hx    Stomach cancer Neg Hx    Esophageal cancer Neg Hx    Colon cancer Neg Hx    Social History:  reports that she quit smoking about 48 years ago. Her smoking use included cigarettes. She started smoking about 58 years ago. She has a 15 pack-year smoking history. She has never used smokeless tobacco. She reports that she does not drink alcohol and does not use drugs.  ROS: A complete review of systems was performed.  All systems are negative except for pertinent findings as noted. ROS   Physical Exam:  Vital signs in last 24 hours: Temp:  [98.1 F (36.7 C)] 98.1 F (36.7 C) (10/07 0735) Pulse Rate:  [91] 91 (10/07 0735) Resp:  [16] 16 (10/07 0735) BP: (120)/(81) 120/81 (10/07 0735) SpO2:  [95 %] 95 % (10/07 0735) General:  Alert and oriented, No acute distress HEENT: Normocephalic, atraumatic Neck: No JVD or lymphadenopathy Cardiovascular: Regular rate and rhythm Lungs: Regular rate and effort Abdomen:  Soft, nontender, nondistended, no abdominal masses Back: No CVA tenderness Extremities: No edema Neurologic: Grossly intact  Laboratory Data:  Results for orders placed or performed during the hospital encounter of 07/17/23 (from the past 24 hour(s))  Basic metabolic panel per protocol     Status: Abnormal   Collection Time: 07/17/23  7:34 AM  Result Value Ref Range   Sodium 139 135 - 145 mmol/L   Potassium 4.0 3.5 - 5.1 mmol/L   Chloride 103 98 - 111 mmol/L   CO2 26 22 - 32 mmol/L   Glucose, Bld 88 70 - 99 mg/dL   BUN 19 8 - 23 mg/dL   Creatinine, Ser 5.62 0.44 - 1.00 mg/dL   Calcium 9.1 8.9 - 13.0 mg/dL   GFR, Estimated 59 (L) >60 mL/min   Anion gap 10 5 - 15  CBC per protocol     Status: Abnormal   Collection Time: 07/17/23  7:34 AM  Result Value Ref Range   WBC 7.8 4.0 - 10.5 K/uL   RBC 3.47 (L) 3.87 - 5.11 MIL/uL   Hemoglobin 11.5 (L) 12.0 - 15.0 g/dL   HCT 86.5 78.4 - 69.6 %   MCV 110.1 (H) 80.0 - 100.0 fL   MCH 33.1 26.0 - 34.0 pg   MCHC 30.1 30.0 - 36.0 g/dL   RDW 29.5 28.4 - 13.2 %   Platelets 112 (L) 150 - 400 K/uL   nRBC 0.0 0.0 - 0.2 %   *  Note: Due to a large number of results and/or encounters for the requested time period, some results have not been displayed. A complete set of results can be found in Results Review.   No results found for this or any previous visit (from the past 240 hour(s)). Creatinine: Recent Labs    07/17/23 0734  CREATININE 0.98    Impression/Assessment:  Right renal calculus  Plan:  Proceed with cystoscopy with right ureteroscopy with laser lithotripsy and ureteral stent exchange.  Ray Church, III 07/17/2023, 9:23 AM

## 2023-07-17 NOTE — Discharge Instructions (Addendum)

## 2023-07-17 NOTE — Transfer of Care (Signed)
Immediate Anesthesia Transfer of Care Note  Patient: Margaretha Quin Hoop  Procedure(s) Performed: CYSTOSCOPY/RIGHT URETEROSCOPY/STENT EXCHANGE (Right: Ureter)  Patient Location: PACU  Anesthesia Type:General  Level of Consciousness: alert   Airway & Oxygen Therapy: Patient Spontanous Breathing and Patient connected to face mask oxygen  Post-op Assessment: Report given to RN and Post -op Vital signs reviewed and stable  Post vital signs: Reviewed and stable  Last Vitals:  Vitals Value Taken Time  BP 147/69 07/17/23 1027  Temp    Pulse 78 07/17/23 1029  Resp 16 07/17/23 1029  SpO2 100 % 07/17/23 1029  Vitals shown include unfiled device data.  Last Pain:  Vitals:   07/17/23 0735  TempSrc: Oral         Complications: No notable events documented.

## 2023-07-18 ENCOUNTER — Encounter (HOSPITAL_COMMUNITY): Payer: Self-pay | Admitting: Urology

## 2023-07-19 ENCOUNTER — Ambulatory Visit: Payer: Medicare Other | Admitting: Physical Therapy

## 2023-07-20 ENCOUNTER — Other Ambulatory Visit: Payer: Self-pay | Admitting: Family Medicine

## 2023-07-20 ENCOUNTER — Other Ambulatory Visit: Payer: Self-pay | Admitting: Cardiovascular Disease

## 2023-07-21 ENCOUNTER — Other Ambulatory Visit: Payer: Self-pay | Admitting: Family Medicine

## 2023-07-25 ENCOUNTER — Ambulatory Visit: Payer: Medicare Other | Admitting: Physical Therapy

## 2023-07-25 DIAGNOSIS — R262 Difficulty in walking, not elsewhere classified: Secondary | ICD-10-CM

## 2023-07-25 DIAGNOSIS — M6281 Muscle weakness (generalized): Secondary | ICD-10-CM

## 2023-07-25 DIAGNOSIS — R2689 Other abnormalities of gait and mobility: Secondary | ICD-10-CM

## 2023-07-25 DIAGNOSIS — R269 Unspecified abnormalities of gait and mobility: Secondary | ICD-10-CM | POA: Diagnosis not present

## 2023-07-25 NOTE — Addendum Note (Signed)
Addended by: Thresa Ross B on: 07/25/2023 08:17 AM   Modules accepted: Orders

## 2023-07-25 NOTE — Therapy (Signed)
OUTPATIENT PHYSICAL THERAPY NEURO TREATMENT   Patient Name: [name] MRN: [id number] DOB:09-12-1946, 77 y.o., female Today's Date: 07/25/2023   PCP:   [name], MD   REFERRING PROVIDER:   [name], MD    END OF SESSION:  PT End of Session - 07/25/23 0838     Visit Number 3    Number of Visits 24    Date for PT Re-Evaluation 09/20/23    Progress Note Due on Visit 10    PT Start Time 0845    PT Stop Time 0927    PT Time Calculation (min) 42 min    Equipment Utilized During Treatment Gait belt    Activity Tolerance Patient tolerated treatment well    Behavior During Therapy [facility] for tasks assessed/performed              Past Medical History:  Diagnosis Date   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 Known DISH with thoracic spine fusion and moderate kyphosis, h/o L spine fusion L2-5   Adjustment disorder with depressed mood 10/18/2014   36 yo son died MI 2014-10-01 Husband died car accident 04/02/2015   Anemia    Anticoagulant long-term use 06/14/2019   She takes warfarin for afib/flutter with CHADS2VASc score of 4 (age, sex, CHF, HTN), s/p 2 bioprosthetic valve replacements Coumadin changed to eliquis 2022 per cardiology   Aortic atherosclerosis 03/03/2022   Noted on 01/2022 chest CT   Arthritis    Arthropathy of spinal facet joint 03/08/2018   Ascending aortic aneurysm 03/02/2021   4.1cm on CT 01/2021 rec yearly monitoring   Bilateral hip pain 09/08/2016   Cancer (HCC)    conon cancer 2012   CAP (community acquired pneumonia) 02/08/2016   Carotid stenosis 12/08/2020   Carotid US - 40-59% BICA stenosis (2015) Carotid US 12/2020 - 1-39% BICA stenosis, incidental 3.4cm structure behind L common carotid artery rec further imaging    Cataracts, bilateral     immature   Chest pain 08/25/2014   myoview low risk 02/2018 ([place])   Chronic cholecystitis with calculus 01/21/2015   S/p cholecystectomy    Chronic diastolic heart failure 09/19/2014   Chronic insomnia    Chronic lower back pain 03/05/2008   Returned to Dr Danielle Dess - translaminar ESI at L3/4. Myelogram showed significant left sided foraminal stenosis at L2/3 and L3/4 in setting of solid arthrodesis, planned DEXA and if stable osteopenia, considering anterolateral decompression with spacer.    CKD (chronic kidney disease) stage 2, GFR 60-89 ml/min 07/03/2015   Constipation    takes Miralax daily as needed   COPD (chronic obstructive pulmonary disease)    Albuterol inhaler daily as needed;Duoneb daily as needed;Spiriva daily   COVID-19 virus infection 11/11/2022   DDD (degenerative disc disease)    cervical - kyphosis with mod DD changes C5/6 and C6/7; lumbar - early DD at L2/3 (Elsner)   Depression    DISH (diffuse idiopathic skeletal hyperostosis) 05/14/2019   By MRI noted fusion of lower thoracic vertebrae - likely contributing to unsteadiness (Elsner)   Dyspnea on exertion 06/05/2015   myoview low risk 02/2018   Edema of both lower legs due to peripheral venous insufficiency    Essential hypertension, benign 03/24/2009   hx of not on nmeds currenlty and has been a while since on meds per pt  Heart murmur    Hematuria 06/17/2021   History of kidney stones    History of radiation therapy 05/30/11 to 07/07/11   rectum   History of rectal cancer 05/26/2011   Rectal cancer 2012, midrectum [id number], s/p neoadj chemo&XRT, lap LAR with diverting loop ileostomy 08/2011, ileostomy takedown 03/2012 Discharged from oncology clinic 2017 Truett Perna)   History of shingles    History of vertebral fracture 11/26/2019   Old L1 vertebral compression fracture incidentally noted on imaging 2020    Hyperlipemia    takes Lovastatin daily   Lacunar infarction    Left caudate   Left hip pain 07/29/2020    Legally blind in left eye    Lymphangioma 01/07/2021   Noted incidentally on carotid US 12/2020 CT neck - Likely benign lymphangioma 01/2021   Macrocytosis 03/27/2022   Folate and b12 checked 2023   Malaise and fatigue 01/22/2018   Meralgia paresthetica 12/13/2007   Mixed stress and urge urinary incontinence 03/16/2015   Failed oxybutynin IR/ER. Vesicare initially helped but then no longer helpful.  myrbetriq was not effective.  Saw urology (MacDiarmid) - UDS largely overactive bladder with mild-mod stress incontinence. Improved on abx course - maintained on [place].    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   Pulmonary infiltrates 04/23/2019   06/13/2018-CT super D chest without contrast- generally stable chronic lung disease with pleural parenchymal scarring and scattered nodularity most of these nodules have been tree-in-bud in appearance likely postinfectious or inflammatory the dominant right lower lobe nodule seen on most recent study has resolved no new  or enlarging nodules  04/22/2019-CT chest without contrast- waxing and waning per   Pulmonary nodule 02/23/2012   RLL nodule-53mm stable 2006, April 2009, and June 2009 Small pulm nodules Jan 2012 - > Oct 2012 without change   Rheumatic heart disease mitral stenosis    mod MS by echo 02/2014   Right leg swelling 06/11/2020   R venous US 06/2020 WNL: - No evidence of deep vein thrombosis in the lower extremity. No indirect evidence of obstruction proximal to the inguinal ligament. - No cystic structure found in the popliteal fossa.   S/P aortic and mitral valve bioprostheses 12/08/2013   AVR 21 mm, MVR 25 mm-MagnaEase pericardia   S/P AVR (aortic valve replacement) 2015   bioprosthetic (Bartle)   S/P MVR (mitral valve replacement) 2015   bioprosthetic (Bartle)   Sepsis secondary to UTI (HCC) 08/22/2017   Severe aortic valve stenosis 10/01/2013   mild-mod by echo 02/2014   Skin lesion 01/03/2022   Small bowel obstruction, partial 08/2013   reolved without NGT  placement.    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   Dr Amy [place]   Syncope 05/28/2018   Thrombocytopenia 11/04/2019   Typical atrial flutter 05/15/2019   Vitamin B12 deficiency 02/28/2015   Start B12 shots 02/2015    Vitamin D deficiency 10/23/2016   Wounds, multiple    bilateral legs   Past Surgical History:  Procedure Laterality Date   ANTERIOR LAT LUMBAR FUSION Left 07/17/2018   Procedure: Lumbar Two-Three Lumbar Three-Four Anterolateral decompression/interbody fusion with lateral plate fixation/Infuse;  Surgeon: Barnett Abu, MD;  Location: [facility] OR;  Service: Neurosurgery;  Laterality: Left;  Lumbar Two-Three Lumbar Three-Four Anterolateral decompression/interbody fusion with lateral plate fixation/Infuse   AORTIC VALVE REPLACEMENT N/A 12/12/2013   Procedure: AORTIC VALVE REPLACEMENT (AVR);  Surgeon: Alleen Borne, MD; Service: Open Heart Surgery   BACK SURGERY  2006, 2007   2006 SPACER, 2007  decompression and fusion L4/5   BOWEL RESECTION  04/02/2012   Procedure: SMALL BOWEL RESECTION;  Surgeon: [name], MD;  Location: [facility];  Service: General;  Laterality: N/A;   BRONCHIAL WASHINGS  03/24/2021   Procedure: BRONCHIAL WASHINGS;  Surgeon: [name], MD;  Location: [facility];  Service: Endoscopy;;   CARPAL TUNNEL RELEASE Bilateral    CATARACT EXTRACTION W/ INTRAOCULAR LENS IMPLANT Left 10/18/2016   CATARACT EXTRACTION W/ INTRAOCULAR LENS IMPLANT Right 12/13/2016   Dr. Dagoberto Ligas   CHOLECYSTECTOMY N/A 01/21/2015   chronic cholecystitis, [name], MD   COLON RESECTION  08/25/2011   Procedure: COLON RESECTION LAPAROSCOPIC;  Surgeon: [name], MD;  Location: [facility];  Service: General;  Laterality: N/A;  Laparoscopic Assisted Low Anterior Resection Diverting Ostomy and onQ pain pump   COLONOSCOPY  03/2013   1 polyp, rpt 3 yrs Christella Hartigan)   COLONOSCOPY  05/2021   diverticulosis and patent colon anastomosis with some edema/narrowing, rpt 5 yrs Christella Hartigan)   COLONOSCOPY WITH PROPOFOL N/A 06/09/2016   patent colo-colonic anastomosis, rpt 5 yrs Christella Hartigan)   CYSTOSCOPY/URETEROSCOPY/HOLMIUM LASER/STENT PLACEMENT Right 07/03/2023   Procedure: CYSTOSCOPY RIGHT RETROGRADE PYEPLGRAM RIGHT URETEROSCOPY/HOLMIUM LASER/STENT PLACEMENT;  Surgeon: Crista Elliot, MD;  Location: [facility];  Service: Urology;  Laterality: Right;  1 HR FOR CASE   CYSTOSCOPY/URETEROSCOPY/HOLMIUM LASER/STENT PLACEMENT Right 07/17/2023   Procedure: CYSTOSCOPY/RIGHT URETEROSCOPY/STENT EXCHANGE;  Surgeon: Crista Elliot, MD;  Location: [facility];  Service: Urology;  Laterality: Right;  60 MINS FOR CASE   EP IMPLANTABLE DEVICE N/A 06/16/2016   Procedure: Pacemaker Implant;  Surgeon: Will Jorja Loa, MD;  Location: [facility];  Service: Cardiovascular;  Laterality: N/A;   FOOT SURGERY  left foot   hammer toe   ILEOSTOMY  08/25/2011   ILEOSTOMY CLOSURE  04/02/2012   Procedure: ILEOSTOMY TAKEDOWN;   Surgeon: [name], MD;  Location: [facility];  Service: General;  Laterality: N/A;   INTRAOPERATIVE TRANSESOPHAGEAL ECHOCARDIOGRAM N/A 12/12/2013   Procedure: INTRAOPERATIVE TRANSESOPHAGEAL ECHOCARDIOGRAM;  Surgeon: Alleen Borne, MD;  Location: [facility];  Service: Open Heart Surgery;  Laterality: N/A;   LEFT AND RIGHT HEART CATHETERIZATION WITH CORONARY ANGIOGRAM N/A 11/27/2013   Procedure: LEFT AND RIGHT HEART CATHETERIZATION WITH CORONARY ANGIOGRAM;  Surgeon: Thurmon Fair, MD;  Location: [facility];  Service: Cardiovascular;  Laterality: N/A;   MITRAL VALVE REPLACEMENT N/A 12/12/2013   Procedure: MITRAL VALVE (MV) REPLACEMENT OR REPAIR;  Surgeon: Alleen Borne, MD; Service: Open Heart Surgery   TEE WITHOUT CARDIOVERSION N/A 11/29/2013   Procedure: TRANSESOPHAGEAL ECHOCARDIOGRAM (TEE);  Surgeon: Gloris Manchester  Thornton Dales, MD;  Location: [facility];  Service: Cardiovascular;  Laterality: N/A;   TEE WITHOUT CARDIOVERSION N/A 06/04/2019   Procedure: TRANSESOPHAGEAL ECHOCARDIOGRAM (TEE);  Surgeon: Thurmon Fair, MD;  Location[facility] ENDOSCOPY;  Service: Cardiovascular;  Laterality: N/A;   TONSILLECTOMY     VIDEO BRONCHOSCOPY N/A 03/24/2021   Procedure: VIDEO BRONCHOSCOPY WITHOUT FLUORO;  Surgeon: [name], MD;  Location: [facility];  Service: Endoscopy;  Laterality: N/A;  SMALL SCOPE,PREMEDICATE WITH NEBULIZED LIDOCAINE   Patient Active Problem List   Diagnosis Date Noted   Pressure sore on heel 06/15/2023   Lump of skin 06/06/2023   Arterial leg ulcer (HCC) 06/06/2023   BRBPR (bright red blood per rectum) 04/24/2023   Head injury 04/24/2023   Cause of injury, fall 03/13/2023   Imbalance 03/13/2023   Venous ulcer of leg (HCC) 01/24/2023   Fatigue 10/05/2022   Positive ANA (antinuclear antibody) 05/30/2022   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   Lacunar infarction (HCC)    Macrocytosis 03/27/2022   Aortic atherosclerosis 03/03/2022   Peripheral neuropathy 01/03/2022   Hematuria  06/17/2021   Pulmonary artery hypertension 06/17/2021   Pacemaker 03/02/2021   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   Ascending aortic aneurysm (HCC) 03/02/2021   Lymphangioma 01/07/2021   Persistent atrial fibrillation 12/09/2020   Carotid stenosis 12/08/2020   Left hip pain 07/29/2020   Right leg swelling 06/11/2020   History of vertebral fracture 11/26/2019   Acute midline low back pain without sciatica 11/26/2019   Thrombocytopenia 11/04/2019   Anticoagulant long-term use 06/14/2019   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   Typical atrial flutter 05/15/2019   DISH (diffuse idiopathic skeletal hyperostosis) 05/14/2019   Pulmonary infiltrates 04/23/2019   Other spondylosis with radiculopathy, lumbar region 07/17/2018   Syncope 05/28/2018   Arthropathy of spinal facet joint 03/08/2018   Malaise and fatigue 01/22/2018   Pain of right sacroiliac joint 08/09/2017   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 12/31/2016   Vitamin D deficiency 10/23/2016   Bilateral hip pain 09/08/2016   History of colonic polyps    Pedal edema 02/25/2016   Advanced care planning/counseling discussion 10/28/2015   CKD (chronic kidney disease) stage 2, GFR 60-89 ml/min 07/03/2015   Dyspnea on exertion 06/05/2015   Mixed stress and urge urinary incontinence 03/16/2015   Vitamin B12 deficiency 02/28/2015   Chronic insomnia 02/25/2015   Osteoporosis 12/09/2014   Medicare annual wellness visit, subsequent 10/15/2014   Health maintenance examination 10/15/2014   Adjustment disorder with mixed anxiety and depressed mood 10/15/2014   Chronic diastolic heart failure 09/19/2014   Chest pain 08/25/2014   S/P aortic and mitral valve bioprostheses 12/2013   Pulmonary nodules 02/23/2012   History of radiation therapy    Macrocytic anemia 09/21/2011   History of rectal cancer 05/26/2011   Essential hypertension, benign 03/24/2009   Overweight (BMI 25.0-29.9) 03/05/2008   Chronic lower back pain  03/05/2008   Meralgia paresthetica 12/13/2007   GERD (gastroesophageal reflux disease) 08/06/2007   HLD (hyperlipidemia) 05/03/2007   COPD (chronic obstructive pulmonary disease) 05/03/2007    ONSET DATE: 06/10/22  REFERRING DIAG:  M54.50,G89.29 (ICD-10-CM) - Chronic midline low back pain without sciatica  R26.89 (ICD-10-CM) - Imbalance    THERAPY DIAG:  No diagnosis found.  Rationale for Evaluation and Treatment: Rehabilitation  SUBJECTIVE:  SUBJECTIVE STATEMENT: Patient reports one fall since last session. Reports she was sore for a bit but is feeling somewhat better now.  Pt accompanied by: self  PERTINENT HISTORY: Patient has a long history of low back pain, arthritis, COPD, hypertension, compression fracture, hyperlipidemia, visual deficits in the left eye  From Eval: Pt reports with her balance she is falling in scenarios where she begins to feel off balance then she falls. She had a fall when putting out round up and other loss of balance when she was repotting a plant and lost her balance.  Both of her fall she noted were in flexed posture for prolonged period likely resulting in inability to perform adequate extension to get out of this posture resulting in fall.  Pt also has low back pain from " disc fragments" that are in her back. The pain worsens with longer standing. Pt low back pain for years. She cannot go into extension and cannot lay on her stomach.  PAIN:  Are you having pain? Yes: NPRS scale: 5/10 Pain location: low back across both sides.  Pain description: Dull, aching Aggravating factors: standing, walking Relieving factors: sitting ( flexed postures)  PRECAUTIONS: Fall  RED FLAGS: None   WEIGHT BEARING RESTRICTIONS: No  FALLS: Has patient fallen in last 6 months? Yes.  Number of falls 2  LIVING ENVIRONMENT: Lives with: lives with their family Lives in: House/apartment Stairs: Yes: Internal: 3 steps; can reach both Has following equipment at home: Single point cane, quad cane  PLOF: Independent with basic ADLs  PATIENT GOALS: Improve balance, decrease falls, improve her back pain   OBJECTIVE:   DIAGNOSTIC FINDINGS: From MRI of lumbar spine in June 2024 interpreted by radiologist and impression as below  IMPRESSION: 1. No acute fracture or acute finding. 2. Chronic compression fracture of L1 stable from the prior lumbar spine CT. 3. Postoperative changes from fusions, L2-L3, L3-L4 and L4-L5, also stable from the prior C  COGNITION: Overall cognitive status: Within functional limits for tasks assessed   SENSATION: Not tested   POSTURE: rounded shoulders, decreased lumbar lordosis, increased thoracic kyphosis, and flexed trunk   LOWER EXTREMITY ROM:     MMT Right Eval Left Eval  Hip flexion 3+ 3+  Hip extension    Hip abduction 4- 4-  Hip adduction 4 4  Hip internal rotation    Hip external rotation    Knee flexion 3+ 3+  Knee extension 4   Ankle dorsiflexion 4+ 4+  Ankle plantarflexion    Ankle inversion    Ankle eversion     (Blank rows = not tested)   (Blank rows = not tested)  BED MOBILITY:  Pt does not sleep in the bed, currently sleeps in recliner  TRANSFERS: Assistive device utilized: Single point cane  Sit to stand: Modified independence Stand to sit: Modified independence Chair to chair: Modified independence Floor:  does not attempt   STAIRS: Level of Assistance: SBA Stair Negotiation Technique: Alternating Pattern  with Bilateral Rails Number of Stairs: 4  Height of Stairs: 6 in  Comments:   GAIT: Gait pattern: decreased arm swing- Right, decreased arm swing- Left, decreased step length- Right, decreased step length- Left, decreased stance time- Right, decreased stance time- Left, and trunk  flexed Distance walked: 450 ft Assistive device utilized: Walker - 4 wheeled Level of assistance: Modified independence Comments: flexed posture improved with use of rollator but UE fatigued with use of rollator.   FUNCTIONAL TESTS:  5 times sit to  stand: 17 sec with B UE, cannot complete 1 without UE assist   Timed up and go (TUG): 14 sec, UE assist   6 minute walk test: 450 ft with clinic rollator, did nit complete 6 minutes limited by back pain  Dynamic Gait Index: 10 14.82 sec no AD      PATIENT SURVEYS:  FOTO 47 RAG of 53  TODAY'S TREATMENT:       Nustep Seat 7 with UE at 7, level 1 x 3 min x 2 sets, fatigue noted requiring rest break at 3 min   Seated  row with GTB attached to door handle, instruction in home setup with this. 2 x 10  Horizontal abduction x 2 x 10 with RTB                                                                                                              Seated LAQ qith 3# AW 2 x 10  Seated march with 3# AW 2 x 10  Seated heel raise with 3# AW 3 x 15  Seated hip abduction with GTB 2 x 10 x 5 sec hold  Seated hip adduciton with ball 20 x 5 sec holds  Seated HS curls 2 x 10 ea LE with RTB     PATIENT EDUCATION: Education details: POC Person educated: Patient Education method: Explanation Education comprehension: verbalized understanding  HOME EXERCISE PROGRAM: Establish visit 2   GOALS: Goals reviewed with patient? Yes  SHORT TERM GOALS: Target date: 07/27/2023    Patient will be independent in home exercise program to improve strength/mobility for better functional independence with ADLs. Baseline: No HEP currently  Goal status: INITIAL   LONG TERM GOALS: Target date: 09/21/2023   1.  Patient (> 40 years old) will complete five times sit to stand test in < 15 seconds indicating an increased LE strength and improved balance. Baseline: 17 sec with UE assist  Goal status: INITIAL  2.  Patient will increase FOTO score to  equal to or greater than  53   to demonstrate statistically significant improvement in mobility and quality of life.  Baseline: 47 Goal status: INITIAL   3.  Patient will increase DGI Balance score by > 6 points to demonstrate decreased fall risk during functional activities. Baseline: 10 Goal status: INITIAL   4.   Patient will reduce timed up and go to <11 seconds to reduce fall risk and demonstrate improved transfer/gait ability. Baseline: 14 sec, [place] use for STS  Goal status: INITIAL  5.   Patient will increase 10 meter walk test to >1.22m/s as to improve gait speed for better community ambulation and to reduce fall risk. Baseline: .675 m/s no AD  Goal status: INITIAL  6.   Patient will increase six minute walk test distance to >800 without increase in back pain for progression to community ambulator and improve gait ability Baseline: 450 ft with clinic rollator to offset low back pain  Goal status: INITIAL    ASSESSMENT:  CLINICAL IMPRESSION: Patient presents  with excellent motivation for completion of physical therapy activities. Pt having some discromfort in her back today with standing so many activities limited to seated position. Will progress to more standing activities next date as pt tolerates.  Pt will continue to benefit from skilled physical therapy intervention to address impairments, improve QOL, and attain therapy goals.     OBJECTIVE IMPAIRMENTS: Abnormal gait, decreased activity tolerance, decreased balance, decreased endurance, decreased mobility, difficulty walking, decreased ROM, decreased strength, hypomobility, and pain.   ACTIVITY LIMITATIONS: carrying, lifting, standing, sleeping, stairs, transfers, and locomotion level  PARTICIPATION LIMITATIONS: driving, community activity, and yard work  PERSONAL FACTORS: Age, Fitness, Time since onset of injury/illness/exacerbation, and 3+ comorbidities:  arthritis, COPD, hypertension, compression fracture,  hyperlipidemia, visual deficits in the left eye  are also affecting patient's functional outcome.   REHAB POTENTIAL: Good  CLINICAL DECISION MAKING: Stable/uncomplicated  EVALUATION COMPLEXITY: Low  PLAN:  PT FREQUENCY: 2x/week  PT DURATION: 12 weeks  PLANNED INTERVENTIONS: Therapeutic exercises, Therapeutic activity, Neuromuscular re-education, Balance training, Gait training, Patient/Family education, Self Care, Joint mobilization, Joint manipulation, Stair training, Dry Needling, and Manual therapy  PLAN FOR NEXT SESSION: HEP, balance, UE strength for walker use, postural strengthening ( post chain), balance    Norman Herrlich, PT 07/25/2023, 8:38 AM

## 2023-07-26 ENCOUNTER — Encounter: Payer: Medicare Other | Attending: Physician Assistant | Admitting: Internal Medicine

## 2023-07-26 DIAGNOSIS — L97818 Non-pressure chronic ulcer of other part of right lower leg with other specified severity: Secondary | ICD-10-CM | POA: Diagnosis not present

## 2023-07-26 DIAGNOSIS — I87333 Chronic venous hypertension (idiopathic) with ulcer and inflammation of bilateral lower extremity: Secondary | ICD-10-CM | POA: Diagnosis present

## 2023-07-26 DIAGNOSIS — I10 Essential (primary) hypertension: Secondary | ICD-10-CM | POA: Insufficient documentation

## 2023-07-26 DIAGNOSIS — J449 Chronic obstructive pulmonary disease, unspecified: Secondary | ICD-10-CM | POA: Diagnosis not present

## 2023-07-26 DIAGNOSIS — L97821 Non-pressure chronic ulcer of other part of left lower leg limited to breakdown of skin: Secondary | ICD-10-CM | POA: Insufficient documentation

## 2023-07-26 DIAGNOSIS — L97328 Non-pressure chronic ulcer of left ankle with other specified severity: Secondary | ICD-10-CM | POA: Insufficient documentation

## 2023-07-26 DIAGNOSIS — S40811D Abrasion of right upper arm, subsequent encounter: Secondary | ICD-10-CM | POA: Diagnosis not present

## 2023-07-26 DIAGNOSIS — Z950 Presence of cardiac pacemaker: Secondary | ICD-10-CM | POA: Diagnosis not present

## 2023-07-26 DIAGNOSIS — X58XXXD Exposure to other specified factors, subsequent encounter: Secondary | ICD-10-CM | POA: Insufficient documentation

## 2023-07-26 DIAGNOSIS — Z7901 Long term (current) use of anticoagulants: Secondary | ICD-10-CM | POA: Diagnosis not present

## 2023-07-27 ENCOUNTER — Other Ambulatory Visit: Payer: Self-pay | Admitting: Family Medicine

## 2023-07-28 ENCOUNTER — Ambulatory Visit (INDEPENDENT_AMBULATORY_CARE_PROVIDER_SITE_OTHER): Payer: Medicare Other

## 2023-07-28 DIAGNOSIS — I495 Sick sinus syndrome: Secondary | ICD-10-CM

## 2023-07-28 LAB — CUP PACEART REMOTE DEVICE CHECK
Battery Remaining Longevity: 23 mo
Battery Remaining Percentage: 22 %
Battery Voltage: 2.86 V
Brady Statistic AP VP Percent: 64 %
Brady Statistic AP VS Percent: 1 %
Brady Statistic AS VP Percent: 35 %
Brady Statistic AS VS Percent: 1 %
Brady Statistic RA Percent Paced: 65 %
Brady Statistic RV Percent Paced: 99 %
Date Time Interrogation Session: 20241018051308
Implantable Lead Connection Status: 753985
Implantable Lead Connection Status: 753985
Implantable Lead Implant Date: 20170907
Implantable Lead Implant Date: 20170907
Implantable Lead Location: 753859
Implantable Lead Location: 753860
Implantable Pulse Generator Implant Date: 20170907
Lead Channel Impedance Value: 360 Ohm
Lead Channel Impedance Value: 490 Ohm
Lead Channel Pacing Threshold Amplitude: 0.625 V
Lead Channel Pacing Threshold Amplitude: 0.75 V
Lead Channel Pacing Threshold Pulse Width: 0.5 ms
Lead Channel Pacing Threshold Pulse Width: 0.5 ms
Lead Channel Sensing Intrinsic Amplitude: 2.7 mV
Lead Channel Sensing Intrinsic Amplitude: 6.3 mV
Lead Channel Setting Pacing Amplitude: 0.875
Lead Channel Setting Pacing Amplitude: 2 V
Lead Channel Setting Pacing Pulse Width: 0.5 ms
Lead Channel Setting Sensing Sensitivity: 2 mV
Pulse Gen Model: 2272
Pulse Gen Serial Number: 7945283

## 2023-07-28 NOTE — Progress Notes (Signed)
With Compression Wrap Urgo K2 Lite, two layer compression system, regular Compression Stockings Add-Ons Electronic Signature(s) Signed: 07/27/2023 5:31:34 PM By: Midge Aver MSN RN CNS WTA Entered By: Midge Aver on 07/26/2023 11:08:01 Robin Searing ([id number]) [id number] Page 15 of 16 -------------------------------------------------------------------------------- Wound Assessment Details Patient Name: Date of Service: LILER, PANTER 07/26/2023 10:30 A M Medical Record Number: [id number] Patient Account Number: [id number] Date of Birth/Sex: Treating RN: 23-Nov-1945 (77 y.o. [name] Primary Care Yago Ludvigsen: [name] Other Clinician: Referring Ferne Ellingwood: Treating Rosalina Dingwall/Extender: RO BSO N, MICHA EL Burt Knack Weeks in Treatment: 0 Wound Status Wound Number: 4 Primary Skin Tear Etiology: Wound Location: Right, Anterior Forearm Wound Status: Open Wounding Event: Skin Tear/Laceration Comorbid Chronic Obstructive Pulmonary Disease (COPD), Date Acquired: 07/19/2023 History: Hypertension Weeks Of Treatment: 0 Clustered Wound: No Photos Wound Measurements Length: (cm) 5.2 Width: (cm) 2 Depth: (cm) 0.1 Area: (cm) 8.168 Volume: (cm) 0.817 % Reduction in Area: % Reduction in Volume: Epithelialization: Small (1-33%) Wound Description Classification: Full Thickness Without Exposed Support Structures Exudate Amount: Large Exudate Type: Sanguinous Exudate Color: red Foul Odor After Cleansing: No Slough/Fibrino No Wound Bed Granulation Amount: Medium (34-66%) Exposed Structure Granulation Quality: Red Fascia Exposed: No Fat Layer (Subcutaneous Tissue) Exposed: Yes Tendon Exposed: No Muscle Exposed: No Joint Exposed: No Bone Exposed: No Treatment Notes Wound #4 (Forearm) Wound Laterality: Right, Anterior Cleanser [place] Ancillary Kit - 15 Day  Supply Discharge Instruction: Use supplies as instructed; Kit contains: (15) Saline Bullets; (15) 3x3 Gauze; [address] TIERNY, LABRANCHE ([id number]) [id number] Page 16 of 16 Discharge Instruction: Gently cleanse wound with antibacterial soap, rinse and pat dry prior to dressing wounds Peri-Wound Care Topical Primary Dressing Xeroform 4x4-HBD (in/in) Discharge Instruction: Apply Xeroform 4x4-HBD (in/in) as directed Secondary Dressing ABD Pad 5x9 (in/in) Discharge Instruction: Cover with ABD pad Secured With Conform 4'' - Conforming Stretch Gauze Bandage 4x75 (in/in) Discharge Instruction: Apply as directed Stretch Net, Size 4, 10 (yds) Compression Wrap Compression Stockings Add-Ons Electronic Signature(s) Signed: 07/27/2023 5:31:34 PM By: Midge Aver MSN RN CNS WTA Entered By: Midge Aver on 07/26/2023 11:10:08 -------------------------------------------------------------------------------- Vitals Details Patient Name: Date of Service: [name]. 07/26/2023 10:30 A M Medical Record Number: [id number] Patient Account Number: [id number] Date of Birth/Sex: Treating RN: 1946/03/30 (77 y.o. [name] Primary Care Berenize Gatlin: [name] Other Clinician: Referring Zykeria Laguardia: Treating Lilburn Straw/Extender: RO BSO N, MICHA EL Burt Knack Weeks in Treatment: 0 Vital Signs Time Taken: 10:38 Temperature (F): 97.8 Height (in): 63 Pulse (bpm): 109 Source: Stated Respiratory Rate (breaths/min): 18 Weight (lbs): 141.8 Blood Pressure (mmHg): 137/76 Source: Stated Reference Range: 80 - 120 mg / dl Body Mass Index (BMI): 25.1 Electronic Signature(s) Signed: 07/27/2023 5:31:34 PM By: Midge Aver MSN RN CNS WTA Entered By: Midge Aver on 07/26/2023 10:39:20  or less) []  - 0 Complex Staple / Suture removal (26 or more) []  - 0 Hypo/Hyperglycemic Management (do not check if billed separately) []  - 0 Ankle / Brachial Index (ABI) - do not check if billed separately Has the patient been seen at the hospital within the last three years: Yes Total Score: 90 Level Of Care: New/Established - Level 3 Electronic Signature(s) Signed: 07/27/2023 5:31:34 PM By: Midge Aver MSN RN CNS WTA Entered By: Midge Aver on 07/26/2023 12:27:40 -------------------------------------------------------------------------------- Compression Therapy Details Patient Name: Date of Service: [name] 07/26/2023 10:30 A M Medical Record Number: [id number] Patient Account Number: [id number] Date of Birth/Sex: Treating RN: [birth year] (77 y.o. [name] Primary Care Norah Fick: [name] Other Clinician: Referring Latayna Ritchie: Treating Jasiri Hanawalt/Extender: RO BSO Dorris Carnes, MICHA EL Burt Knack Weeks in Treatment: 0 Compression Therapy Performed for Wound Assessment: Wound #2 Left,Medial Lower Leg Performed By: Clinician Midge Aver, RN Compression Type: Three Layer Post Procedure Diagnosis Same as Pre-procedure Electronic Signature(s) Signed: 07/27/2023 5:31:34 PM By: Midge Aver MSN RN CNS WTA Entered By: Midge Aver on 07/26/2023 11:38:07 -------------------------------------------------------------------------------- Compression Therapy Details Patient Name: Date of Service: [name] 07/26/2023  10:30 A M Medical Record Number: [id number] Patient Account Number: [id number] Date of Birth/Sex: Treating RN: 06/18/1946 (77 y.o. [name] Primary Care Darbie Biancardi: [name] Other Clinician: Referring Solstice Lastinger: Treating Gladstone Rosas/Extender: RO BSO Dorris Carnes, MICHA EL Burt Knack Weeks in Treatment: 0 Compression Therapy Performed for Wound Assessment: Wound #1 Right,Medial Lower Leg Performed By: Raj Janus, RN [place], Roderick Pee ([id number]) [id number] Page 4 of 16 Compression Type: Three Layer Post Procedure Diagnosis Same as Pre-procedure Electronic Signature(s) Signed: 07/27/2023 5:31:34 PM By: Midge Aver MSN RN CNS WTA Entered By: Midge Aver on 07/26/2023 11:38:07 -------------------------------------------------------------------------------- Compression Therapy Details Patient Name: Date of Service: [name] 07/26/2023 10:30 A M Medical Record Number: [id number] Patient Account Number: [id number] Date of Birth/Sex: Treating RN: 1946-01-15 (77 y.o. [name] Primary Care Azeneth Carbonell: [name] Other Clinician: Referring Hatley Henegar: Treating Prerna Harold/Extender: RO BSO Dorris Carnes, MICHA EL Burt Knack Weeks in Treatment: 0 Compression Therapy Performed for Wound Assessment: Wound #3 Left,Lateral Malleolus Performed By: Clinician Midge Aver, RN Compression Type: Three Layer Post Procedure Diagnosis Same as Pre-procedure Electronic Signature(s) Signed: 07/27/2023 5:31:34 PM By: Midge Aver MSN RN CNS WTA Entered By: Midge Aver on 07/26/2023 11:38:07 -------------------------------------------------------------------------------- Encounter Discharge Information Details Patient Name: Date of Service: [name]. 07/26/2023 10:30 A M Medical Record Number: [id number] Patient Account Number: [id number] Date of Birth/Sex: Treating RN: 05-May-1946 (77 y.o. [name] Primary Care Crespin Forstrom:  [name] Other Clinician: Referring Jeronica Stlouis: Treating Nakai Pollio/Extender: RO BSO N, MICHA EL Burt Knack Weeks in Treatment: 0 Encounter Discharge Information Items Post Procedure Vitals Discharge Condition: Stable Temperature (F): 97.8 Ambulatory Status: Cane Pulse (bpm): 109 Discharge Destination: Home Respiratory Rate (breaths/min): 18 Transportation: Private Auto Blood Pressure (mmHg): 137/76 Accompanied By: self Schedule Follow-up Appointment: Yes Clinical Summary of Care: JINEEN, MAGRUDER ([id number]) [id number] Page 5 of 16 Electronic Signature(s) Signed: 07/26/2023 12:32:46 PM By: Midge Aver MSN RN CNS WTA Entered By: Midge Aver on 07/26/2023 12:32:46 -------------------------------------------------------------------------------- Lower Extremity Assessment Details Patient Name: Date of Service: TIARRA, KIOUS 07/26/2023 10:30 A M Medical Record Number: [id number] Patient Account Number: [id number] Date of Birth/Sex: Treating RN: 14-Nov-1945 (77 y.o. [name] Primary Care Verlene Glantz: [name] Other Clinician: Referring Daenerys Buttram: Treating Jabree Rebert/Extender: RO BSO Dorris Carnes, MICHA EL Burt Knack  N/A Weeks of Treatment: Open N/A N/A Wound Status: No N/A N/A Wound Recurrence: 5.2x2x0.1 N/A N/A Measurements L x W x D  (cm) 8.168 N/A N/A A (cm) : rea 0.817 N/A N/A Volume (cm) : Full Thickness Without Exposed N/A N/A Classification: Support Structures Large N/A N/A Exudate A mount: Sanguinous N/A N/A Exudate Type: red N/A N/A Exudate Color: Medium (34-66%) N/A N/A Granulation A mount: Red N/A N/A Granulation Quality: N/A N/A N/A Necrotic A mount: Fat Layer (Subcutaneous Tissue): Yes N/A N/A Exposed Structures: Fascia: No Tendon: No Muscle: No Joint: No Bone: No Small (1-33%) N/A N/A Epithelialization: Debridement - Selective/Open Wound N/A N/A Debridement: Pre-procedure Verification/Time Out 11:34 N/A N/A Taken: Skin/Epidermis N/A N/A Level: 6.12 N/A N/A Debridement A (sq cm): rea Curette N/A N/A Instrument: Minimum N/A N/A Bleeding: Pressure N/A N/A Hemostasis Achieved: 0 N/A N/A Procedural Pain: 0 N/A N/A Post Procedural Pain: Procedure was tolerated well N/A N/A Debridement Treatment Response: 5.2x2x0.1 N/A N/A Post Debridement Measurements L x W x D (cm) 0.817 N/A N/A Post Debridement Volume: (cm) Debridement N/A N/A Procedures Performed: Treatment Notes Electronic Signature(s) Signed: 07/26/2023 4:42:07 PM By: Baltazar Najjar MD Entered By: Baltazar Najjar on 07/26/2023 12:15:15 -------------------------------------------------------------------------------- Multi-Disciplinary Care Plan Details Patient Name: Date of Service: [name] 07/26/2023 10:30 A Elpidio Anis ([id number]) [id number] Page 8 of 16 Medical Record Number: [id number] Patient Account Number: [id number] Date of Birth/Sex: Treating RN: [birth year] (77 y.o. [name] Primary Care Chayden Garrelts: [name] Other Clinician: Referring Krisanne Lich: Treating Bishop Vanderwerf/Extender: RO BSO Dorris Carnes, MICHA EL Mickie Kay in Treatment: 0 Active Inactive Orientation to the Wound Care Program Nursing Diagnoses: Knowledge deficit related to the wound  healing center program Goals: Patient/caregiver will verbalize understanding of the Wound Healing Center Program Date Initiated: 07/26/2023 Target Resolution Date: 08/02/2023 Goal Status: Active Interventions: Provide education on orientation to the wound center Notes: Venous Leg Ulcer Nursing Diagnoses: Actual venous Insuffiency (use after diagnosis is confirmed) Knowledge deficit related to disease process and management Potential for venous Insuffiency (use before diagnosis confirmed) Goals: Patient will maintain optimal edema control Date Initiated: 07/26/2023 Target Resolution Date: 08/26/2023 Goal Status: Active Patient/caregiver will verbalize understanding of disease process and disease management Date Initiated: 07/26/2023 Target Resolution Date: 08/26/2023 Goal Status: Active Verify adequate tissue perfusion prior to therapeutic compression application Date Initiated: 07/26/2023 Target Resolution Date: 08/26/2023 Goal Status: Active Interventions: Assess peripheral edema status every visit. Compression as ordered Provide education on venous insufficiency Treatment Activities: Therapeutic compression applied : 07/26/2023 Notes: Wound/Skin Impairment Nursing Diagnoses: Impaired tissue integrity Knowledge deficit related to ulceration/compromised skin integrity Goals: Patient/caregiver will verbalize understanding of skin care regimen Date Initiated: 07/26/2023 Target Resolution Date: 08/26/2023 Goal Status: Active Ulcer/skin breakdown will have a volume reduction of 30% by week 4 Date Initiated: 07/26/2023 Target Resolution Date: 08/26/2023 Goal Status: Active Ulcer/skin breakdown will have a volume reduction of 50% by week 8 Date Initiated: 07/26/2023 Target Resolution Date: 09/25/2023 Goal Status: Active Ulcer/skin breakdown will have a volume reduction of 80% by week 12 Date Initiated: 07/26/2023 Target Resolution Date: 10/26/2023 Goal Status:  Active Ulcer/skin breakdown will heal within 14 weeks Date Initiated: 07/26/2023 Target Resolution Date: 11/09/2023 Goal Status: Active Interventions: DONNIS, MCKELL ([id number]) [id number] Page 9 of 16 Assess patient/caregiver ability to obtain necessary supplies Assess patient/caregiver ability to perform ulcer/skin care regimen upon admission and as needed Assess ulceration(s) every visit Provide education on ulcer and skin care Treatment Activities: Referred to DME Francena Zender for  XENA, OMAN ([id number]) [id number] Page 1 of 16 Visit Report for 07/26/2023 Allergy List Details Patient Name: Date of Service: SHAWNAH, [place] 07/26/2023 10:30 A M Medical Record Number: [id number] Patient Account Number: [id number] Date of Birth/Sex: Treating RN: 1946-04-01 (77 y.o. [name] Primary Care Shanigua Gibb: [name] Other Clinician: Referring Marvia Troost: Treating Alexandra Posadas/Extender: RO BSO N, MICHA EL Burt Knack Weeks in Treatment: 0 Allergies Active Allergies codeine methylprednisolone acetate Sulfa (Sulfonamide Antibiotics) Allergy Notes Electronic Signature(s) Signed: 07/27/2023 5:31:34 PM By: Midge Aver MSN RN CNS WTA Entered By: Midge Aver on 07/26/2023 10:33:02 -------------------------------------------------------------------------------- Arrival Information Details Patient Name: Date of Service: [name] 07/26/2023 10:30 A M Medical Record Number: [id number] Patient Account Number: [id number] Date of Birth/Sex: Treating RN: 09/30/1946 (77 y.o. [name] Primary Care Sopheap Boehle: [name] Other Clinician: Referring Nahomy Limburg: Treating Charleston Vierling/Extender: RO BSO Dorris Carnes, MICHA EL Mickie Kay in Treatment: 0 Visit Information Patient Arrived: Gilmer Mor Arrival Time: 10:33 Accompanied By: SELF Transfer Assistance: None Patient Identification Verified: Yes Secondary Verification Process Completed: Yes Patient Requires Transmission-Based Precautions: No Patient Has Alerts: Yes Patient Alerts: Patient on Blood Thinner eliquis R ABI 1.25 TBI 1.08 CHANDAL, LARE ([id number]) [id number] Page 2 of 16 L ABI 1.29 TBI 0.92 PACEMAKER Electronic Signature(s) Signed: 07/26/2023 12:22:09 PM By: Midge Aver MSN RN CNS WTA Entered By: Midge Aver on 07/26/2023  12:22:09 -------------------------------------------------------------------------------- Clinic Level of Care Assessment Details Patient Name: Date of Service: LUEVENIA, COLASURDO 07/26/2023 10:30 A M Medical Record Number: [id number] Patient Account Number: [id number] Date of Birth/Sex: Treating RN: 1946/01/13 (77 y.o. [name] Primary Care Porter Nakama: [name] Other Clinician: Referring Darnise Montag: Treating Betania Dizon/Extender: RO BSO N, MICHA EL Burt Knack Weeks in Treatment: 0 Clinic Level of Care Assessment Items TOOL 1 Quantity Score X- 1 0 Use when EandM and Procedure is performed on INITIAL visit ASSESSMENTS - Nursing Assessment / Reassessment X- 1 20 General Physical Exam (combine w/ comprehensive assessment (listed just below) when performed on new pt. evals) X- 1 25 Comprehensive Assessment (HX, ROS, Risk Assessments, Wounds Hx, etc.) ASSESSMENTS - Wound and Skin Assessment / Reassessment []  - 0 Dermatologic / Skin Assessment (not related to wound area) ASSESSMENTS - Ostomy and/or Continence Assessment and Care []  - 0 Incontinence Assessment and Management []  - 0 Ostomy Care Assessment and Management (repouching, etc.) PROCESS - Coordination of Care []  - 0 Simple Patient / Family Education for ongoing care X- 1 20 Complex (extensive) Patient / Family Education for ongoing care X- 1 10 Staff obtains Chiropractor, Records, T Results / Process Orders est []  - 0 Staff telephones HHA, Nursing Homes / Clarify orders / etc []  - 0 Routine Transfer to another Facility (non-emergent condition) []  - 0 Routine Hospital Admission (non-emergent condition) X- 1 15 New Admissions / Manufacturing engineer / Ordering NPWT Apligraf, etc. , []  - 0 Emergency Hospital Admission (emergent condition) PROCESS - Special Needs []  - 0 Pediatric / Minor Patient Management []  - 0 Isolation Patient Management []  - 0 Hearing / Language / Visual special needs []  -  0 Assessment of Community assistance (transportation, D/C planning, etc.) []  - 0 Additional assistance / Altered mentation []  - 0 Support Surface(s) Assessment (bed, cushion, seat, etc.) INTERVENTIONS - Miscellaneous RILYNN, BOTERO ([id number]) [id number] Page 3 of 16 []  - 0 External ear exam []  - 0 Patient Transfer (multiple staff / Nurse, adult / Similar devices) []  - 0 Simple Staple / Suture removal (25  XENA, OMAN ([id number]) [id number] Page 1 of 16 Visit Report for 07/26/2023 Allergy List Details Patient Name: Date of Service: SHAWNAH, J[place]SEY 07/26/2023 10:30 A M Medical Record Number: [id number] Patient Account Number: [id number] Date of Birth/Sex: Treating RN: 1946-04-01 (77 y.o. [name] Primary Care Shanigua Gibb: [name] Other Clinician: Referring Marvia Troost: Treating Alexandra Posadas/Extender: RO BSO N, MICHA EL Burt Knack Weeks in Treatment: 0 Allergies Active Allergies codeine methylprednisolone acetate Sulfa (Sulfonamide Antibiotics) Allergy Notes Electronic Signature(s) Signed: 07/27/2023 5:31:34 PM By: Midge Aver MSN RN CNS WTA Entered By: Midge Aver on 07/26/2023 10:33:02 -------------------------------------------------------------------------------- Arrival Information Details Patient Name: Date of Service: [name] 07/26/2023 10:30 A M Medical Record Number: [id number] Patient Account Number: [id number] Date of Birth/Sex: Treating RN: 09/30/1946 (77 y.o. [name] Primary Care Sopheap Boehle: [name] Other Clinician: Referring Nahomy Limburg: Treating Charleston Vierling/Extender: RO BSO Dorris Carnes, MICHA EL Mickie Kay in Treatment: 0 Visit Information Patient Arrived: Gilmer Mor Arrival Time: 10:33 Accompanied By: SELF Transfer Assistance: None Patient Identification Verified: Yes Secondary Verification Process Completed: Yes Patient Requires Transmission-Based Precautions: No Patient Has Alerts: Yes Patient Alerts: Patient on Blood Thinner eliquis R ABI 1.25 TBI 1.08 CHANDAL, LARE ([id number]) [id number] Page 2 of 16 L ABI 1.29 TBI 0.92 PACEMAKER Electronic Signature(s) Signed: 07/26/2023 12:22:09 PM By: Midge Aver MSN RN CNS WTA Entered By: Midge Aver on 07/26/2023  12:22:09 -------------------------------------------------------------------------------- Clinic Level of Care Assessment Details Patient Name: Date of Service: LUEVENIA, COLASURDO 07/26/2023 10:30 A M Medical Record Number: [id number] Patient Account Number: [id number] Date of Birth/Sex: Treating RN: 1946/01/13 (77 y.o. [name] Primary Care Porter Nakama: [name] Other Clinician: Referring Darnise Montag: Treating Betania Dizon/Extender: RO BSO N, MICHA EL Burt Knack Weeks in Treatment: 0 Clinic Level of Care Assessment Items TOOL 1 Quantity Score X- 1 0 Use when EandM and Procedure is performed on INITIAL visit ASSESSMENTS - Nursing Assessment / Reassessment X- 1 20 General Physical Exam (combine w/ comprehensive assessment (listed just below) when performed on new pt. evals) X- 1 25 Comprehensive Assessment (HX, ROS, Risk Assessments, Wounds Hx, etc.) ASSESSMENTS - Wound and Skin Assessment / Reassessment []  - 0 Dermatologic / Skin Assessment (not related to wound area) ASSESSMENTS - Ostomy and/or Continence Assessment and Care []  - 0 Incontinence Assessment and Management []  - 0 Ostomy Care Assessment and Management (repouching, etc.) PROCESS - Coordination of Care []  - 0 Simple Patient / Family Education for ongoing care X- 1 20 Complex (extensive) Patient / Family Education for ongoing care X- 1 10 Staff obtains Chiropractor, Records, T Results / Process Orders est []  - 0 Staff telephones HHA, Nursing Homes / Clarify orders / etc []  - 0 Routine Transfer to another Facility (non-emergent condition) []  - 0 Routine Hospital Admission (non-emergent condition) X- 1 15 New Admissions / Manufacturing engineer / Ordering NPWT Apligraf, etc. , []  - 0 Emergency Hospital Admission (emergent condition) PROCESS - Special Needs []  - 0 Pediatric / Minor Patient Management []  - 0 Isolation Patient Management []  - 0 Hearing / Language / Visual special needs []  -  0 Assessment of Community assistance (transportation, D/C planning, etc.) []  - 0 Additional assistance / Altered mentation []  - 0 Support Surface(s) Assessment (bed, cushion, seat, etc.) INTERVENTIONS - Miscellaneous RILYNN, BOTERO ([id number]) [id number] Page 3 of 16 []  - 0 External ear exam []  - 0 Patient Transfer (multiple staff / Nurse, adult / Similar devices) []  - 0 Simple Staple / Suture removal (25  or less) []  - 0 Complex Staple / Suture removal (26 or more) []  - 0 Hypo/Hyperglycemic Management (do not check if billed separately) []  - 0 Ankle / Brachial Index (ABI) - do not check if billed separately Has the patient been seen at the hospital within the last three years: Yes Total Score: 90 Level Of Care: New/Established - Level 3 Electronic Signature(s) Signed: 07/27/2023 5:31:34 PM By: Midge Aver MSN RN CNS WTA Entered By: Midge Aver on 07/26/2023 12:27:40 -------------------------------------------------------------------------------- Compression Therapy Details Patient Name: Date of Service: [name] 07/26/2023 10:30 A M Medical Record Number: [id number] Patient Account Number: [id number] Date of Birth/Sex: Treating RN: [birth year] (77 y.o. [name] Primary Care Norah Fick: [name] Other Clinician: Referring Latayna Ritchie: Treating Jasiri Hanawalt/Extender: RO BSO Dorris Carnes, MICHA EL Burt Knack Weeks in Treatment: 0 Compression Therapy Performed for Wound Assessment: Wound #2 Left,Medial Lower Leg Performed By: Clinician Midge Aver, RN Compression Type: Three Layer Post Procedure Diagnosis Same as Pre-procedure Electronic Signature(s) Signed: 07/27/2023 5:31:34 PM By: Midge Aver MSN RN CNS WTA Entered By: Midge Aver on 07/26/2023 11:38:07 -------------------------------------------------------------------------------- Compression Therapy Details Patient Name: Date of Service: [name] 07/26/2023  10:30 A M Medical Record Number: [id number] Patient Account Number: [id number] Date of Birth/Sex: Treating RN: 06/18/1946 (77 y.o. [name] Primary Care Darbie Biancardi: [name] Other Clinician: Referring Solstice Lastinger: Treating Gladstone Rosas/Extender: RO BSO Dorris Carnes, MICHA EL Burt Knack Weeks in Treatment: 0 Compression Therapy Performed for Wound Assessment: Wound #1 Right,Medial Lower Leg Performed By: Raj Janus, RN [place], Roderick Pee ([id number]) [id number] Page 4 of 16 Compression Type: Three Layer Post Procedure Diagnosis Same as Pre-procedure Electronic Signature(s) Signed: 07/27/2023 5:31:34 PM By: Midge Aver MSN RN CNS WTA Entered By: Midge Aver on 07/26/2023 11:38:07 -------------------------------------------------------------------------------- Compression Therapy Details Patient Name: Date of Service: [name] 07/26/2023 10:30 A M Medical Record Number: [id number] Patient Account Number: [id number] Date of Birth/Sex: Treating RN: 1946-01-15 (77 y.o. [name] Primary Care Azeneth Carbonell: [name] Other Clinician: Referring Hatley Henegar: Treating Prerna Harold/Extender: RO BSO Dorris Carnes, MICHA EL Burt Knack Weeks in Treatment: 0 Compression Therapy Performed for Wound Assessment: Wound #3 Left,Lateral Malleolus Performed By: Clinician Midge Aver, RN Compression Type: Three Layer Post Procedure Diagnosis Same as Pre-procedure Electronic Signature(s) Signed: 07/27/2023 5:31:34 PM By: Midge Aver MSN RN CNS WTA Entered By: Midge Aver on 07/26/2023 11:38:07 -------------------------------------------------------------------------------- Encounter Discharge Information Details Patient Name: Date of Service: [name]. 07/26/2023 10:30 A M Medical Record Number: [id number] Patient Account Number: [id number] Date of Birth/Sex: Treating RN: 05-May-1946 (77 y.o. [name] Primary Care Crespin Forstrom:  [name] Other Clinician: Referring Jeronica Stlouis: Treating Nakai Pollio/Extender: RO BSO N, MICHA EL Burt Knack Weeks in Treatment: 0 Encounter Discharge Information Items Post Procedure Vitals Discharge Condition: Stable Temperature (F): 97.8 Ambulatory Status: Cane Pulse (bpm): 109 Discharge Destination: Home Respiratory Rate (breaths/min): 18 Transportation: Private Auto Blood Pressure (mmHg): 137/76 Accompanied By: self Schedule Follow-up Appointment: Yes Clinical Summary of Care: JINEEN, MAGRUDER ([id number]) [id number] Page 5 of 16 Electronic Signature(s) Signed: 07/26/2023 12:32:46 PM By: Midge Aver MSN RN CNS WTA Entered By: Midge Aver on 07/26/2023 12:32:46 -------------------------------------------------------------------------------- Lower Extremity Assessment Details Patient Name: Date of Service: TIARRA, KIOUS 07/26/2023 10:30 A M Medical Record Number: [id number] Patient Account Number: [id number] Date of Birth/Sex: Treating RN: 14-Nov-1945 (77 y.o. [name] Primary Care Verlene Glantz: [name] Other Clinician: Referring Daenerys Buttram: Treating Jabree Rebert/Extender: RO BSO Dorris Carnes, MICHA EL Burt Knack  dressing supplies : 07/26/2023 Skin care regimen initiated : 07/26/2023 Notes: Electronic Signature(s) Signed: 07/26/2023 12:30:05 PM By: Midge Aver MSN RN CNS WTA Previous Signature: 07/26/2023 12:30:00 PM Version By: Midge Aver MSN RN CNS WTA Entered By: Midge Aver on 07/26/2023 12:30:05 -------------------------------------------------------------------------------- Pain Assessment Details Patient Name: Date of Service: [name] 07/26/2023 10:30 A M Medical Record Number: [id number] Patient Account Number: [id number] Date of Birth/Sex: Treating RN: 26-Aug-1946 (77 y.o. [name] Primary Care Jarreau Callanan: [name] Other Clinician: Referring Aashritha Miedema: Treating Mairyn Lenahan/Extender: RO BSO N, MICHA EL Burt Knack Weeks in Treatment: 0 Active Problems Location of Pain Severity and Description of Pain Patient Has Paino Yes Site Locations Rate the pain. Current Pain Level: 6 Pain Management and Medication Current Pain Management: Electronic Signature(s) Signed: 07/27/2023 5:31:34 PM By: Midge Aver MSN RN CNS WTA Entered By: Midge Aver on 07/26/2023 10:38:35 Robin Searing ([id number]) [id number] Page 10 of 16 -------------------------------------------------------------------------------- Patient/Caregiver Education Details Patient Name: Date of Service: MAGDA, BOLENBAUGH 10/16/2024andnbsp10:30 A  M Medical Record Number: [id number] Patient Account Number: [id number] Date of Birth/Gender: Treating RN: April 26, 1946 (77 y.o. [name] Primary Care Physician: [name] Other Clinician: Referring Physician: Treating Physician/Extender: RO BSO Dorris Carnes, MICHA EL Mickie Kay in Treatment: 0 Education Assessment Education Provided To: Patient Education Topics Provided Venous: Handouts: Controlling Swelling with Compression Stockings, Management of Venous Insufficiency Methods: Explain/Verbal Responses: State content correctly Welcome [facility]New Patient Packet: o Handouts: Welcome [facility] o Methods: Explain/Verbal Responses: State content correctly Wound Debridement: Handouts: Wound Debridement Methods: Explain/Verbal Responses: State content correctly Wound/Skin Impairment: Handouts: Caring for Your Ulcer Methods: Explain/Verbal Responses: State content correctly Electronic Signature(s) Signed: 07/27/2023 5:31:34 PM By: Midge Aver MSN RN CNS WTA Entered By: Midge Aver on 07/26/2023 12:30:52 -------------------------------------------------------------------------------- Wound Assessment Details Patient Name: Date of Service: [name] 07/26/2023 10:30 A M Medical Record Number: [id number] Patient Account Number: [id number] Date of Birth/Sex: Treating RN: 1946/05/23 (77 y.o. [name] Primary Care Daemon Dowty: [name] Other Clinician: Referring Jobany Montellano: Treating Airiana Elman/Extender: Chauncey Mann, MICHA EL Mickie Kay in Treatment: 0 TATYM, SATTERWHITE ([id number]) [id number] Page 11 of 16 Wound Status Wound Number: 1 Primary Etiology: Venous Leg Ulcer Wound Location: Right, Medial Lower Leg Wound Status: Open Wounding Event: Gradually Appeared Comorbid Chronic Obstructive Pulmonary Disease (COPD), History: Hypertension Date Acquired: 07/26/2023 Weeks Of  Treatment: 0 Clustered Wound: No Photos Wound Measurements Length: (cm) 0.3 Width: (cm) 0.3 Depth: (cm) 0.1 Area: (cm) 0.071 Volume: (cm) 0.007 % Reduction in Area: % Reduction in Volume: Epithelialization: Small (1-33%) Wound Description Classification: Full Thickness Without Exposed Support Structures Exudate Amount: Medium Exudate Type: Sanguinous Exudate Color: red Foul Odor After Cleansing: No Slough/Fibrino No Wound Bed Granulation Amount: Medium (34-66%) Exposed Structure Granulation Quality: Red, Pink Fascia Exposed: No Necrotic Amount: None Present (0%) Fat Layer (Subcutaneous Tissue) Exposed: No Tendon Exposed: No Muscle Exposed: No Joint Exposed: No Bone Exposed: No Treatment Notes Wound #1 (Lower Leg) Wound Laterality: Right, Medial Cleanser Soap and Water Discharge Instruction: Gently cleanse wound with antibacterial soap, rinse and pat dry prior to dressing wounds Vashe 5.8 (oz) Discharge Instruction: Use vashe 5.8 (oz) as directed Peri-Wound Care Topical Primary Dressing Silvercel Small 2x2 (in/in) Discharge Instruction: Apply Silvercel Small 2x2 (in/in) as instructed Secondary Dressing ABD Pad 5x9 (in/in) Discharge Instruction: Cover with ABD pad Secured With Compression Wrap [place] K2 Lite, two layer compression system, regular Compression

## 2023-07-28 NOTE — Progress Notes (Signed)
and Water 1 x Per Week/30 Days Discharge Instructions: Gently cleanse wound with antibacterial soap, rinse and pat dry prior to dressing wounds Cleanser: Vashe 5.8 (oz) 1 x Per Week/30 Days Discharge Instructions: Use vashe 5.8 (oz) as directed Prim Dressing: Silvercel Small 2x2 (in/in) 1 x Per Week/30 Days ary Discharge Instructions: Apply Silvercel Small 2x2 (in/in) as instructed Secondary Dressing: ABD Pad 5x9 (in/in) 1 x Per Week/30 Days Discharge Instructions: Cover with ABD pad Compression Wrap: Urgo K2 Lite, two layer compression system, regular 1 x Per Week/30 Days Wound #4 - Forearm Wound Laterality: Right, Anterior Cleanser: Byram Ancillary Kit - 15 Day Supply (DME) (Generic) 1 x Per Day/30  Days Discharge Instructions: Use supplies as instructed; Kit contains: (15) Saline Bullets; (15) 3x3 Gauze; 15 pr Gloves Cleanser: Soap and Water 1 x Per Day/30 Days Discharge Instructions: Gently cleanse wound with antibacterial soap, rinse and pat dry prior to dressing wounds Prim Dressing: Xeroform 4x4-HBD (in/in) (DME) (Generic) 1 x Per Day/30 Days ary Discharge Instructions: Apply Xeroform 4x4-HBD (in/in) as directed Secondary Dressing: ABD Pad 5x9 (in/in) 1 x Per Day/30 Days Discharge Instructions: Cover with ABD pad Secured With: Conform 4'' - Conforming Stretch Gauze Bandage 4x75 (in/in) 1 x Per Day/30 Days Discharge Instructions: Apply as directed Secured With: Stretch Net, Size 4, 10 (yds) 1 x Per Day/30 Days Electronic Signature(s) Signed: 07/26/2023 12:27:12 PM By: Midge Aver MSN RN CNS WTA Signed: 07/26/2023 4:42:07 PM By: Baltazar Najjar MD Entered By: Midge Aver on 07/26/2023 12:27:11 -------------------------------------------------------------------------------- Problem List Details Patient Name: Date of Service: [name]. 07/26/2023 10:30 A M Medical Record Number: [id number] Patient Account Number: [id number] Date of Birth/Sex: Treating RN: [birth year] (77 y.o. 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 07/26/2023 No  Yes severity L97.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 of right upper arm, subsequent encounter 07/26/2023 No Yes Inactive Problems Resolved Problems Electronic Signature(s) Signed: 07/26/2023 4:42:07 PM By: Baltazar Najjar MD Entered By: Baltazar Najjar on 07/26/2023 11:49:04 -------------------------------------------------------------------------------- Progress Note Details Patient Name: Date of Service: [name] 07/26/2023 10:30 A M Medical Record Number: [id number] Patient Account Number: [id number] Date of Birth/Sex: Treating RN: October 27, 1945 (77 y.o. [name] Primary Care Provider: [name] Other Clinician: Referring Provider: Treating Provider/Extender: Chauncey Mann, MICHA EL Burt Knack Weeks in Treatment: 0 Subjective Chief Complaint Information obtained from Patient 07/26/2023; new patient visit for wounds on her bilateral lower legs and a more recent abrasion to her right forearm after a fall History of Present Illness (HPI) 07/26/2023 ADMISSION This is a 77 year old woman who has been referred for wounds on her bilateral lower legs by her primary physician. She was last seen in primary care on 06/15/2023 at which time she had an abrasion injury to the left anterior shin right medial leg and some scabbing over the left lateral malleolus. She also had AIDANA, KAMRATH ([id number]) [id number] Page 8 of 12 bilateral heel  METRA, MOST ([id number]) [id number] Page 1 of 12 Visit Report for 07/26/2023 Chief Complaint Document Details Patient Name: Date of Service: LIZBETH, OMORI 07/26/2023 10:30 A M Medical Record Number: [id number] Patient Account Number: [id number] Date of Birth/Sex: Treating RN: Nov 25, 1945 (77 y.o. [name] Primary Care Provider: [name] Other Clinician: Referring Provider: Treating Provider/Extender: RO BSO Dorris Carnes, MICHA EL Burt Knack Weeks in Treatment: 0 Information Obtained from: Patient Chief Complaint 07/26/2023; new patient visit for wounds on her bilateral lower legs and a more recent abrasion to her right forearm after a fall Electronic Signature(s) Signed: 07/26/2023 4:42:07 PM By: Baltazar Najjar MD Entered By: Baltazar Najjar on 07/26/2023 12:15:39 -------------------------------------------------------------------------------- Debridement Details Patient Name: Date of Service: [name] 07/26/2023 10:30 A M Medical Record Number: [id number] Patient Account Number: [id number] Date of Birth/Sex: Treating RN: Nov 24, 1945 (77 y.o. [name] Primary Care Provider: [name] Other Clinician: Referring Provider: Treating Provider/Extender: RO BSO Dorris Carnes, MICHA EL Burt Knack Weeks in Treatment: 0 Debridement Performed for Assessment: Wound #2 Left,Medial Lower Leg Performed By: Physician Maxwell Caul, MD Debridement Type: Debridement Severity of Tissue Pre Debridement: Fat layer exposed Level of Consciousness (Pre-procedure): Awake and Alert Pre-procedure Verification/Time Out Yes - 11:34 Taken: Start Time: 11:34 Percent of Wound Bed Debrided: 100% T Area Debrided (cm): otal 0.06 Tissue and other material debrided: Viable, Non-Viable, Skin: Dermis , Skin: Epidermis Level: Skin/Epidermis Debridement Description: Selective/Open Wound Instrument: Curette Bleeding: Minimum Hemostasis  Achieved: Pressure Procedural Pain: 0 Post Procedural Pain: 0 Response to Treatment: Procedure was tolerated well AMIRA, BREARLEY ([id number]) [id number] Page 2 of 12 Level of Consciousness (Post- Awake and Alert procedure): Post Debridement Measurements of Total Wound Length: (cm) 0.4 Width: (cm) 0.2 Depth: (cm) 0.1 Volume: (cm) 0.006 Character of Wound/Ulcer Post Debridement: Stable Severity of Tissue Post Debridement: Fat layer exposed Post Procedure Diagnosis Same as Pre-procedure Electronic Signature(s) Signed: 07/26/2023 4:42:07 PM By: Baltazar Najjar MD Signed: 07/27/2023 5:31:34 PM By: Midge Aver MSN RN CNS WTA Entered By: Midge Aver on 07/26/2023 11:35:38 -------------------------------------------------------------------------------- Debridement Details Patient Name: Date of Service: [name]. 07/26/2023 10:30 A M Medical Record Number: [id number] Patient Account Number: [id number] Date of Birth/Sex: Treating RN: 17-Feb-1946 (77 y.o. [name] Primary Care Provider: [name] Other Clinician: Referring Provider: Treating Provider/Extender: Chauncey Mann, MICHA EL Burt Knack Weeks in Treatment: 0 Debridement Performed for Assessment: Wound #1 Right,Medial Lower Leg Performed By: Physician Maxwell Caul, MD Debridement Type: Debridement Severity of Tissue Pre Debridement: Fat layer exposed Level of Consciousness (Pre-procedure): Awake and Alert Pre-procedure Verification/Time Out Yes - 11:34 Taken: Start Time: 11:34 Percent of Wound Bed Debrided: 100% T Area Debrided (cm): otal 0.07 Tissue and other material debrided: Viable, Non-Viable, Skin: Dermis , Skin: Epidermis Level: Skin/Epidermis Debridement Description: Selective/Open Wound Instrument: Curette Bleeding: Minimum Hemostasis Achieved: Pressure Procedural Pain: 0 Post Procedural Pain: 0 Response to Treatment: Procedure was tolerated  well Level of Consciousness (Post- Awake and Alert procedure): Post Debridement Measurements of Total Wound Length: (cm) 0.3 Width: (cm) 0.3 Depth: (cm) 0.1 Volume: (cm) 0.007 Character of Wound/Ulcer Post Debridement: Stable Severity of Tissue Post Debridement: Fat layer exposed Post Procedure Diagnosis Same as Pre-procedure ELFA, DEPASS ([id number]) [id number] Page 3 of 12 Electronic Signature(s) Signed: 07/26/2023 4:42:07 PM By: Baltazar Najjar MD Signed: 07/27/2023 5:31:34 PM By: Midge Aver MSN RN CNS WTA Entered By: Midge Aver on 07/26/2023 11:36:57 -------------------------------------------------------------------------------- Debridement Details Patient Name: Date of Service:  Post Debridement is stable. Severity of Tissue Post Debridement is: Fat layer exposed. Post procedure Diagnosis Wound #2: Same as Pre-Procedure Pre-procedure diagnosis of Wound #2 is a Venous Leg Ulcer located on the Left,Medial Lower Leg . There was a Three Layer Compression Therapy Procedure by Midge Aver, RN. Post procedure  Diagnosis Wound #2: Same as Pre-Procedure Wound #4 Pre-procedure diagnosis of Wound #4 is a Skin T located on the Right,Anterior Forearm . There was a Selective/Open Wound Skin/Epidermis Debridement ear with a total area of 6.12 sq cm performed by Maxwell Caul, MD. With the following instrument(s): Curette to remove Viable and Non-Viable tissue/material. Material removed includes Skin: Dermis and Skin: Epidermis and. No specimens were taken. A time out was conducted at 11:34, prior to the start of the procedure. A Minimum amount of bleeding was controlled with Pressure. The procedure was tolerated well with a pain level of 0 throughout and a pain level of 0 following the procedure. Post Debridement Measurements: 5.2cm length x 2cm width x 0.1cm depth; 0.817cm^3 volume. Character of Wound/Ulcer Post Debridement is stable. Post procedure Diagnosis Wound #4: Same as Pre-Procedure Wound #3 Pre-procedure diagnosis of Wound #3 is an Atypical located on the Left,Lateral Malleolus . There was a Three Layer Compression Therapy Procedure by Midge Aver, RN. Post procedure Diagnosis Wound #3: Same as Pre-Procedure Plan 1. We put silver alginate on the 3 small open wounds on her lower extremities bilaterally. 2. Urgo K2 lite wrap. 3. T the right arm skin tear we use Xeroform under gauze and Coban to hold in place o 4. I talked to her about stockings. She reminds me that getting standard stockings over the toe on wear and possible for her living alone. We will show her juxta lite stockings next week and see what she thinks about this. LYLIAN, TYMINSKI ([id number]) [id number] Page 10 of 12 5. I used a #3 curette to remove the surface eschar from the wounds on her legs also the dry surrounding skin. The only area on the leg I am legs I am concerned about is the left lateral malleolus only because wounds in this area are sometimes difficult to close Electronic  Signature(s) Signed: 07/26/2023 4:42:07 PM By: Baltazar Najjar MD Entered By: Baltazar Najjar on 07/26/2023 12:30:50 -------------------------------------------------------------------------------- ROS/PFSH Details Patient Name: Date of Service: [name] 07/26/2023 10:30 A M Medical Record Number: [id number] Patient Account Number: [id number] Date of Birth/Sex: Treating RN: Feb 16, 1946 (77 y.o. [name] Primary Care Provider: [name] Other Clinician: Referring Provider: Treating Provider/Extender: RO BSO N, MICHA EL Burt Knack Weeks in Treatment: 0 Constitutional Symptoms (General Health) Complaints and Symptoms: Negative for: Fatigue; Fever; Chills; Marked Weight Change Eyes Complaints and Symptoms: Positive for: Glasses / Contacts Gastrointestinal Complaints and Symptoms: Negative for: Frequent diarrhea; Nausea; Vomiting Endocrine Complaints and Symptoms: Negative for: Hepatitis; Thyroid disease; Polydypsia (Excessive Thirst) Medical History: Negative for: Type I Diabetes; Type II Diabetes Genitourinary Complaints and Symptoms: Negative for: Kidney failure/ Dialysis; Incontinence/dribbling Review of System Notes: CKD 2 history of kidney stones. Integumentary (Skin) Complaints and Symptoms: Negative for: Wounds; Bleeding or bruising tendency; Breakdown; Swelling Musculoskeletal Complaints and Symptoms: Negative for: Muscle Pain; Muscle Weakness Neurologic Complaints and Symptoms: Negative for: Numbness/parasthesias; [address] SHAQUONDA, STELMA ([id number]) [id number] Page 11 of 12 Psychiatric Complaints and Symptoms: Negative for: Anxiety; Claustrophobia Ear/Nose/Mouth/Throat Complaints and Symptoms: Review of System Notes: Hearing aids Respiratory Medical History: Positive for: Chronic Obstructive Pulmonary Disease (COPD) Cardiovascular Medical History: Positive for: Hypertension Past  SHENAY, VOLTZ 07/26/2023 10:30 A M Medical Record Number: [id number] Patient Account Number: [id number] Date of Birth/Sex: Treating RN: April 10, 1946 (77 y.o. [name] Primary Care Provider: [name] Other Clinician: Referring Provider: Treating Provider/Extender: Chauncey Mann, MICHA EL Burt Knack Weeks in Treatment: 0 Debridement Performed for Assessment: Wound #4 Right,Anterior Forearm Performed By: Physician Maxwell Caul, MD Debridement Type: Debridement Level of Consciousness (Pre-procedure): Awake and Alert Pre-procedure Verification/Time Out Yes - 11:34 Taken: Start Time: 11:34 Percent of Wound Bed Debrided: 75% T Area Debrided (cm): otal 6.12 Tissue and other material debrided: Viable, Non-Viable, Skin: Dermis , Skin: Epidermis Level: Skin/Epidermis Debridement Description: Selective/Open Wound Instrument: Curette Bleeding: Minimum Hemostasis Achieved: Pressure Procedural Pain: 0 Post Procedural Pain: 0 Response to Treatment: Procedure was tolerated well Level of Consciousness (Post- Awake and Alert procedure): Post Debridement Measurements of Total Wound Length: (cm) 5.2 Width: (cm) 2 Depth: (cm) 0.1 Volume:  (cm) 0.817 Character of Wound/Ulcer Post Debridement: Stable Post Procedure Diagnosis Same as Pre-procedure Electronic Signature(s) Signed: 07/26/2023 4:42:07 PM By: Baltazar Najjar MD Signed: 07/27/2023 5:31:34 PM By: Midge Aver MSN RN CNS WTA Entered By: Midge Aver on 07/26/2023 11:37:36 Robin Searing ([id number]) [id number] Page 4 of 12 -------------------------------------------------------------------------------- HPI Details Patient Name: Date of Service: KIERYN, JUSTO 07/26/2023 10:30 A M Medical Record Number: [id number] Patient Account Number: [id number] Date of Birth/Sex: Treating RN: 1946-08-29 (77 y.o. [name] Primary Care Provider: [name] Other Clinician: Referring Provider: Treating Provider/Extender: RO BSO Dorris Carnes, MICHA EL Burt Knack Weeks in Treatment: 0 History of Present Illness HPI Description: 07/26/2023 ADMISSION This is a 77 year old woman who has been referred for wounds on her bilateral lower legs by her primary physician. She was last seen in primary care on 06/15/2023 at which time she had an abrasion injury to the left anterior shin right medial leg and some scabbing over the left lateral malleolus. She also had bilateral heel issues although they were not open today. A culture of 1 of these wounds showed Pseudomonas I am not exactly sure what antibiotic she received. She had arterial studies done on 07/13/2023 that showed an ABI on the right of 1.25 TBI 1.08 with biphasic and triphasic waveforms. On the left ABI I was 1.29 TBI at 0.92 and triphasic waveforms. All of this suggests that her arterial flow was quite good. The patient states that she has on and off wounds in her bilateral lower extremities for at least a year. The areas will heal and then reopen. She has been prescribed compression stockings in the past but she cannot get them on. She lives independently. A week ago she had a fall at  home causing a skin tear on the right dorsal arm. Past medical history includes hematuria, COPD, sick sinus syndrome on Eliquis, hypertension, she has a Occupational psychologist) Signed: 07/26/2023 4:42:07 PM By: Baltazar Najjar MD Entered By: Baltazar Najjar on 07/26/2023 12:19:31 -------------------------------------------------------------------------------- Physical Exam Details Patient Name: Date of Service: [name] 07/26/2023 10:30 A M Medical Record Number: [id number] Patient Account Number: [id number] Date of Birth/Sex: Treating RN: [birth year] (77 y.o. [name] Primary Care Provider: [name] Other Clinician: Referring Provider: Treating Provider/Extender: RO BSO N, MICHA EL Burt Knack Weeks in Treatment: 0 Constitutional Sitting or standing Blood Pressure is within target range for patient.. Pulse regular and within target range for patient.Marland Kitchen Respirations regular, non-labored and within target range.. Temperature is normal and within the target range for  and Water 1 x Per Week/30 Days Discharge Instructions: Gently cleanse wound with antibacterial soap, rinse and pat dry prior to dressing wounds Cleanser: Vashe 5.8 (oz) 1 x Per Week/30 Days Discharge Instructions: Use vashe 5.8 (oz) as directed Prim Dressing: Silvercel Small 2x2 (in/in) 1 x Per Week/30 Days ary Discharge Instructions: Apply Silvercel Small 2x2 (in/in) as instructed Secondary Dressing: ABD Pad 5x9 (in/in) 1 x Per Week/30 Days Discharge Instructions: Cover with ABD pad Compression Wrap: Urgo K2 Lite, two layer compression system, regular 1 x Per Week/30 Days Wound #4 - Forearm Wound Laterality: Right, Anterior Cleanser: Byram Ancillary Kit - 15 Day Supply (DME) (Generic) 1 x Per Day/30  Days Discharge Instructions: Use supplies as instructed; Kit contains: (15) Saline Bullets; (15) 3x3 Gauze; 15 pr Gloves Cleanser: Soap and Water 1 x Per Day/30 Days Discharge Instructions: Gently cleanse wound with antibacterial soap, rinse and pat dry prior to dressing wounds Prim Dressing: Xeroform 4x4-HBD (in/in) (DME) (Generic) 1 x Per Day/30 Days ary Discharge Instructions: Apply Xeroform 4x4-HBD (in/in) as directed Secondary Dressing: ABD Pad 5x9 (in/in) 1 x Per Day/30 Days Discharge Instructions: Cover with ABD pad Secured With: Conform 4'' - Conforming Stretch Gauze Bandage 4x75 (in/in) 1 x Per Day/30 Days Discharge Instructions: Apply as directed Secured With: Stretch Net, Size 4, 10 (yds) 1 x Per Day/30 Days Electronic Signature(s) Signed: 07/26/2023 12:27:12 PM By: Midge Aver MSN RN CNS WTA Signed: 07/26/2023 4:42:07 PM By: Baltazar Najjar MD Entered By: Midge Aver on 07/26/2023 12:27:11 -------------------------------------------------------------------------------- Problem List Details Patient Name: Date of Service: [name]. 07/26/2023 10:30 A M Medical Record Number: [id number] Patient Account Number: [id number] Date of Birth/Sex: Treating RN: [birth year] (77 y.o. 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 07/26/2023 No  Yes severity L97.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 of right upper arm, subsequent encounter 07/26/2023 No Yes Inactive Problems Resolved Problems Electronic Signature(s) Signed: 07/26/2023 4:42:07 PM By: Baltazar Najjar MD Entered By: Baltazar Najjar on 07/26/2023 11:49:04 -------------------------------------------------------------------------------- Progress Note Details Patient Name: Date of Service: [name] 07/26/2023 10:30 A M Medical Record Number: [id number] Patient Account Number: [id number] Date of Birth/Sex: Treating RN: October 27, 1945 (77 y.o. [name] Primary Care Provider: [name] Other Clinician: Referring Provider: Treating Provider/Extender: Chauncey Mann, MICHA EL Burt Knack Weeks in Treatment: 0 Subjective Chief Complaint Information obtained from Patient 07/26/2023; new patient visit for wounds on her bilateral lower legs and a more recent abrasion to her right forearm after a fall History of Present Illness (HPI) 07/26/2023 ADMISSION This is a 77 year old woman who has been referred for wounds on her bilateral lower legs by her primary physician. She was last seen in primary care on 06/15/2023 at which time she had an abrasion injury to the left anterior shin right medial leg and some scabbing over the left lateral malleolus. She also had AIDANA, KAMRATH ([id number]) [id number] Page 8 of 12 bilateral heel  METRA, MOST ([id number]) [id number] Page 1 of 12 Visit Report for 07/26/2023 Chief Complaint Document Details Patient Name: Date of Service: LIZBETH, OMORI 07/26/2023 10:30 A M Medical Record Number: [id number] Patient Account Number: [id number] Date of Birth/Sex: Treating RN: Nov 25, 1945 (77 y.o. [name] Primary Care Provider: [name] Other Clinician: Referring Provider: Treating Provider/Extender: RO BSO Dorris Carnes, MICHA EL Burt Knack Weeks in Treatment: 0 Information Obtained from: Patient Chief Complaint 07/26/2023; new patient visit for wounds on her bilateral lower legs and a more recent abrasion to her right forearm after a fall Electronic Signature(s) Signed: 07/26/2023 4:42:07 PM By: Baltazar Najjar MD Entered By: Baltazar Najjar on 07/26/2023 12:15:39 -------------------------------------------------------------------------------- Debridement Details Patient Name: Date of Service: [name] 07/26/2023 10:30 A M Medical Record Number: [id number] Patient Account Number: [id number] Date of Birth/Sex: Treating RN: Nov 24, 1945 (77 y.o. [name] Primary Care Provider: [name] Other Clinician: Referring Provider: Treating Provider/Extender: RO BSO Dorris Carnes, MICHA EL Burt Knack Weeks in Treatment: 0 Debridement Performed for Assessment: Wound #2 Left,Medial Lower Leg Performed By: Physician Maxwell Caul, MD Debridement Type: Debridement Severity of Tissue Pre Debridement: Fat layer exposed Level of Consciousness (Pre-procedure): Awake and Alert Pre-procedure Verification/Time Out Yes - 11:34 Taken: Start Time: 11:34 Percent of Wound Bed Debrided: 100% T Area Debrided (cm): otal 0.06 Tissue and other material debrided: Viable, Non-Viable, Skin: Dermis , Skin: Epidermis Level: Skin/Epidermis Debridement Description: Selective/Open Wound Instrument: Curette Bleeding: Minimum Hemostasis  Achieved: Pressure Procedural Pain: 0 Post Procedural Pain: 0 Response to Treatment: Procedure was tolerated well AMIRA, BREARLEY ([id number]) [id number] Page 2 of 12 Level of Consciousness (Post- Awake and Alert procedure): Post Debridement Measurements of Total Wound Length: (cm) 0.4 Width: (cm) 0.2 Depth: (cm) 0.1 Volume: (cm) 0.006 Character of Wound/Ulcer Post Debridement: Stable Severity of Tissue Post Debridement: Fat layer exposed Post Procedure Diagnosis Same as Pre-procedure Electronic Signature(s) Signed: 07/26/2023 4:42:07 PM By: Baltazar Najjar MD Signed: 07/27/2023 5:31:34 PM By: Midge Aver MSN RN CNS WTA Entered By: Midge Aver on 07/26/2023 11:35:38 -------------------------------------------------------------------------------- Debridement Details Patient Name: Date of Service: [name]. 07/26/2023 10:30 A M Medical Record Number: [id number] Patient Account Number: [id number] Date of Birth/Sex: Treating RN: 17-Feb-1946 (77 y.o. [name] Primary Care Provider: [name] Other Clinician: Referring Provider: Treating Provider/Extender: Chauncey Mann, MICHA EL Burt Knack Weeks in Treatment: 0 Debridement Performed for Assessment: Wound #1 Right,Medial Lower Leg Performed By: Physician Maxwell Caul, MD Debridement Type: Debridement Severity of Tissue Pre Debridement: Fat layer exposed Level of Consciousness (Pre-procedure): Awake and Alert Pre-procedure Verification/Time Out Yes - 11:34 Taken: Start Time: 11:34 Percent of Wound Bed Debrided: 100% T Area Debrided (cm): otal 0.07 Tissue and other material debrided: Viable, Non-Viable, Skin: Dermis , Skin: Epidermis Level: Skin/Epidermis Debridement Description: Selective/Open Wound Instrument: Curette Bleeding: Minimum Hemostasis Achieved: Pressure Procedural Pain: 0 Post Procedural Pain: 0 Response to Treatment: Procedure was tolerated  well Level of Consciousness (Post- Awake and Alert procedure): Post Debridement Measurements of Total Wound Length: (cm) 0.3 Width: (cm) 0.3 Depth: (cm) 0.1 Volume: (cm) 0.007 Character of Wound/Ulcer Post Debridement: Stable Severity of Tissue Post Debridement: Fat layer exposed Post Procedure Diagnosis Same as Pre-procedure ELFA, DEPASS ([id number]) [id number] Page 3 of 12 Electronic Signature(s) Signed: 07/26/2023 4:42:07 PM By: Baltazar Najjar MD Signed: 07/27/2023 5:31:34 PM By: Midge Aver MSN RN CNS WTA Entered By: Midge Aver on 07/26/2023 11:36:57 -------------------------------------------------------------------------------- Debridement Details Patient Name: Date of Service:  METRA, MOST ([id number]) [id number] Page 1 of 12 Visit Report for 07/26/2023 Chief Complaint Document Details Patient Name: Date of Service: LIZBETH, OMORI 07/26/2023 10:30 A M Medical Record Number: [id number] Patient Account Number: [id number] Date of Birth/Sex: Treating RN: Nov 25, 1945 (77 y.o. [name] Primary Care Provider: [name] Other Clinician: Referring Provider: Treating Provider/Extender: RO BSO Dorris Carnes, MICHA EL Burt Knack Weeks in Treatment: 0 Information Obtained from: Patient Chief Complaint 07/26/2023; new patient visit for wounds on her bilateral lower legs and a more recent abrasion to her right forearm after a fall Electronic Signature(s) Signed: 07/26/2023 4:42:07 PM By: Baltazar Najjar MD Entered By: Baltazar Najjar on 07/26/2023 12:15:39 -------------------------------------------------------------------------------- Debridement Details Patient Name: Date of Service: [name] 07/26/2023 10:30 A M Medical Record Number: [id number] Patient Account Number: [id number] Date of Birth/Sex: Treating RN: Nov 24, 1945 (77 y.o. [name] Primary Care Provider: [name] Other Clinician: Referring Provider: Treating Provider/Extender: RO BSO Dorris Carnes, MICHA EL Burt Knack Weeks in Treatment: 0 Debridement Performed for Assessment: Wound #2 Left,Medial Lower Leg Performed By: Physician Maxwell Caul, MD Debridement Type: Debridement Severity of Tissue Pre Debridement: Fat layer exposed Level of Consciousness (Pre-procedure): Awake and Alert Pre-procedure Verification/Time Out Yes - 11:34 Taken: Start Time: 11:34 Percent of Wound Bed Debrided: 100% T Area Debrided (cm): otal 0.06 Tissue and other material debrided: Viable, Non-Viable, Skin: Dermis , Skin: Epidermis Level: Skin/Epidermis Debridement Description: Selective/Open Wound Instrument: Curette Bleeding: Minimum Hemostasis  Achieved: Pressure Procedural Pain: 0 Post Procedural Pain: 0 Response to Treatment: Procedure was tolerated well AMIRA, BREARLEY ([id number]) [id number] Page 2 of 12 Level of Consciousness (Post- Awake and Alert procedure): Post Debridement Measurements of Total Wound Length: (cm) 0.4 Width: (cm) 0.2 Depth: (cm) 0.1 Volume: (cm) 0.006 Character of Wound/Ulcer Post Debridement: Stable Severity of Tissue Post Debridement: Fat layer exposed Post Procedure Diagnosis Same as Pre-procedure Electronic Signature(s) Signed: 07/26/2023 4:42:07 PM By: Baltazar Najjar MD Signed: 07/27/2023 5:31:34 PM By: Midge Aver MSN RN CNS WTA Entered By: Midge Aver on 07/26/2023 11:35:38 -------------------------------------------------------------------------------- Debridement Details Patient Name: Date of Service: [name]. 07/26/2023 10:30 A M Medical Record Number: [id number] Patient Account Number: [id number] Date of Birth/Sex: Treating RN: 17-Feb-1946 (77 y.o. [name] Primary Care Provider: [name] Other Clinician: Referring Provider: Treating Provider/Extender: Chauncey Mann, MICHA EL Burt Knack Weeks in Treatment: 0 Debridement Performed for Assessment: Wound #1 Right,Medial Lower Leg Performed By: Physician Maxwell Caul, MD Debridement Type: Debridement Severity of Tissue Pre Debridement: Fat layer exposed Level of Consciousness (Pre-procedure): Awake and Alert Pre-procedure Verification/Time Out Yes - 11:34 Taken: Start Time: 11:34 Percent of Wound Bed Debrided: 100% T Area Debrided (cm): otal 0.07 Tissue and other material debrided: Viable, Non-Viable, Skin: Dermis , Skin: Epidermis Level: Skin/Epidermis Debridement Description: Selective/Open Wound Instrument: Curette Bleeding: Minimum Hemostasis Achieved: Pressure Procedural Pain: 0 Post Procedural Pain: 0 Response to Treatment: Procedure was tolerated  well Level of Consciousness (Post- Awake and Alert procedure): Post Debridement Measurements of Total Wound Length: (cm) 0.3 Width: (cm) 0.3 Depth: (cm) 0.1 Volume: (cm) 0.007 Character of Wound/Ulcer Post Debridement: Stable Severity of Tissue Post Debridement: Fat layer exposed Post Procedure Diagnosis Same as Pre-procedure ELFA, DEPASS ([id number]) [id number] Page 3 of 12 Electronic Signature(s) Signed: 07/26/2023 4:42:07 PM By: Baltazar Najjar MD Signed: 07/27/2023 5:31:34 PM By: Midge Aver MSN RN CNS WTA Entered By: Midge Aver on 07/26/2023 11:36:57 -------------------------------------------------------------------------------- Debridement Details Patient Name: Date of Service:  and Water 1 x Per Week/30 Days Discharge Instructions: Gently cleanse wound with antibacterial soap, rinse and pat dry prior to dressing wounds Cleanser: Vashe 5.8 (oz) 1 x Per Week/30 Days Discharge Instructions: Use vashe 5.8 (oz) as directed Prim Dressing: Silvercel Small 2x2 (in/in) 1 x Per Week/30 Days ary Discharge Instructions: Apply Silvercel Small 2x2 (in/in) as instructed Secondary Dressing: ABD Pad 5x9 (in/in) 1 x Per Week/30 Days Discharge Instructions: Cover with ABD pad Compression Wrap: Urgo K2 Lite, two layer compression system, regular 1 x Per Week/30 Days Wound #4 - Forearm Wound Laterality: Right, Anterior Cleanser: Byram Ancillary Kit - 15 Day Supply (DME) (Generic) 1 x Per Day/30  Days Discharge Instructions: Use supplies as instructed; Kit contains: (15) Saline Bullets; (15) 3x3 Gauze; 15 pr Gloves Cleanser: Soap and Water 1 x Per Day/30 Days Discharge Instructions: Gently cleanse wound with antibacterial soap, rinse and pat dry prior to dressing wounds Prim Dressing: Xeroform 4x4-HBD (in/in) (DME) (Generic) 1 x Per Day/30 Days ary Discharge Instructions: Apply Xeroform 4x4-HBD (in/in) as directed Secondary Dressing: ABD Pad 5x9 (in/in) 1 x Per Day/30 Days Discharge Instructions: Cover with ABD pad Secured With: Conform 4'' - Conforming Stretch Gauze Bandage 4x75 (in/in) 1 x Per Day/30 Days Discharge Instructions: Apply as directed Secured With: Stretch Net, Size 4, 10 (yds) 1 x Per Day/30 Days Electronic Signature(s) Signed: 07/26/2023 12:27:12 PM By: Midge Aver MSN RN CNS WTA Signed: 07/26/2023 4:42:07 PM By: Baltazar Najjar MD Entered By: Midge Aver on 07/26/2023 12:27:11 -------------------------------------------------------------------------------- Problem List Details Patient Name: Date of Service: [name]. 07/26/2023 10:30 A M Medical Record Number: [id number] Patient Account Number: [id number] Date of Birth/Sex: Treating RN: [birth year] (77 y.o. 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 07/26/2023 No  Yes severity L97.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 of right upper arm, subsequent encounter 07/26/2023 No Yes Inactive Problems Resolved Problems Electronic Signature(s) Signed: 07/26/2023 4:42:07 PM By: Baltazar Najjar MD Entered By: Baltazar Najjar on 07/26/2023 11:49:04 -------------------------------------------------------------------------------- Progress Note Details Patient Name: Date of Service: [name] 07/26/2023 10:30 A M Medical Record Number: [id number] Patient Account Number: [id number] Date of Birth/Sex: Treating RN: October 27, 1945 (77 y.o. [name] Primary Care Provider: [name] Other Clinician: Referring Provider: Treating Provider/Extender: Chauncey Mann, MICHA EL Burt Knack Weeks in Treatment: 0 Subjective Chief Complaint Information obtained from Patient 07/26/2023; new patient visit for wounds on her bilateral lower legs and a more recent abrasion to her right forearm after a fall History of Present Illness (HPI) 07/26/2023 ADMISSION This is a 77 year old woman who has been referred for wounds on her bilateral lower legs by her primary physician. She was last seen in primary care on 06/15/2023 at which time she had an abrasion injury to the left anterior shin right medial leg and some scabbing over the left lateral malleolus. She also had AIDANA, KAMRATH ([id number]) [id number] Page 8 of 12 bilateral heel

## 2023-07-28 NOTE — Progress Notes (Signed)
[name], [name] ([id number]) [phone] [id number] Page 1 of 5 Visit Report for 07/26/2023 Abuse Risk Screen Details Patient Name: Date of Service: Diane, [name] 07/26/2023 10:30 A M Medical Record Number: [id number] Patient Account Number: [id number] Date of Birth/Sex: Treating RN: 11-Jun-1946 (77 y.o. [name] Primary Care Kazia Grisanti: [name] Other Clinician: Referring Kavian Peters: Treating Tania Perrott/Extender: RO BSO Dorris Carnes, MICHA EL Burt Knack Weeks in Treatment: 0 Abuse Risk Screen Items Answer Electronic Signature(s) Signed: 07/27/2023 5:31:34 PM By: Midge Aver MSN RN CNS WTA Entered By: Midge Aver on 07/26/2023 10:46:34 -------------------------------------------------------------------------------- Activities of Daily Living Details Patient Name: Date of Service: [name], [name] 07/26/2023 10:30 A M Medical Record Number: [id number] Patient Account Number: [id number] Date of Birth/Sex: Treating RN: Jan 04, 1946 (77 y.o. [name] Primary Care Urian Martenson: [name] Other Clinician: Referring Nasir Bright: Treating Rylie Knierim/Extender: RO BSO N, MICHA EL Burt Knack Weeks in Treatment: 0 Activities of Daily Living Items Answer Activities of Daily Living (Please select one for each item) Drive Automobile Completely Able T Medications ake Completely Able Use T elephone Completely Able Care for Appearance Completely Able Use T oilet Completely Able Bath / Shower Completely Able Dress Self Completely Able Feed Self Completely Able Walk Completely Able Get In / Out Bed Completely Able Housework Completely Able Prepare Meals Completely Able Handle Money Completely Able Shop for Self Completely [name], [name] ([id number]) [phone] [id number] Page 2 of 5 Electronic Signature(s) Signed: 07/27/2023 5:31:34 PM By: Midge Aver MSN RN CNS WTA Entered By: Midge Aver on 07/26/2023  10:46:46 -------------------------------------------------------------------------------- Education Screening Details Patient Name: Date of Service: [name] 07/26/2023 10:30 A M Medical Record Number: [id number] Patient Account Number: [id number] Date of Birth/Sex: Treating RN: [birth year] (77 y.o. [name] Primary Care Marium Ragan: [name] Other Clinician: Referring Keishana Klinger: Treating Guyla Bless/Extender: Chauncey Mann, MICHA EL Mickie Kay in Treatment: 0 Learning Preferences/Education Level/Primary Language Learning Preference: Explanation, Demonstration Preferred Language: English Cognitive Barrier Language Barrier: No Translator Needed: No Memory Deficit: No Emotional Barrier: No Cultural/Religious Beliefs Affecting Medical Care: No Physical Barrier Impaired Vision: No Impaired Hearing: No Decreased Hand dexterity: No Knowledge/Comprehension Knowledge Level: High Comprehension Level: High Ability to understand written instructions: High Ability to understand verbal instructions: High Motivation Anxiety Level: Calm Cooperation: Cooperative Education Importance: Acknowledges Need Interest in Health Problems: Asks Questions Perception: Coherent Willingness to Engage in Self-Management High Activities: Readiness to Engage in Self-Management High Activities: Electronic Signature(s) Signed: 07/27/2023 5:31:34 PM By: Midge Aver MSN RN CNS WTA Entered By: Midge Aver on 07/26/2023 10:47:10 [name] ([id number]) [phone] [id number] Page 3 of 5 -------------------------------------------------------------------------------- Fall Risk Assessment Details Patient Name: Date of Service: Diane, [name] 07/26/2023 10:30 A M Medical Record Number: [id number] Patient Account Number: [id number] Date of Birth/Sex: Treating RN: [birth year] (77 y.o. [name] Primary Care Danna Casella: [name] Other  Clinician: Referring Myasia Sinatra: Treating Analee Montee/Extender: RO BSO N, MICHA EL Burt Knack Weeks in Treatment: 0 Fall Risk Assessment Items Have you had 2 or more falls in the last 12 monthso 0 Yes Have you had any fall that resulted in injury in the last 12 monthso 0 Yes FALLS RISK SCREEN History of falling - immediate or within 3 months 25 Yes Secondary diagnosis (Do you have 2 or more medical diagnoseso) 0 No Ambulatory aid None/bed rest/wheelchair/nurse 0 No Crutches/cane/walker 0 No Furniture 0 No Intravenous therapy Access/Saline/Heparin Lock 0 No Gait/Transferring Normal/ bed rest/ wheelchair 0 No Weak (short  steps with or without shuffle, stooped but able to lift head while walking, may seek 0 No support from furniture) Impaired (short steps with shuffle, may have difficulty arising from chair, head down, impaired 0 No balance) Mental Status Oriented to own ability 0 Yes Electronic Signature(s) Signed: 07/27/2023 5:31:34 PM By: Midge Aver MSN RN CNS WTA Entered By: Midge Aver on 07/26/2023 10:47:41 -------------------------------------------------------------------------------- Foot Assessment Details Patient Name: Date of Service: [name] 07/26/2023 10:30 A M Medical Record Number: [id number] Patient Account Number: [id number] Date of Birth/Sex: Treating RN: 07/12/1946 (77 y.o. [name] Primary Care Paulmichael Schreck: [name] Other Clinician: Referring Wyett Narine: Treating Jordon Kristiansen/Extender: RO BSO Dorris Carnes, MICHA EL Burt Knack Weeks in Treatment: 0 Foot Assessment Items Site Locations [name], [name] [name] ([id number]) [phone] [id number] Page 4 of 5 + = Sensation present, - = Sensation absent, C = Callus, U = Ulcer R = Redness, W = Warmth, M = Maceration, PU = Pre-ulcerative lesion F = Fissure, S = Swelling, D = Dryness Assessment Right: Left: Other Deformity: No No Prior Foot Ulcer: No No Prior Amputation:  No No Charcot Joint: No No Ambulatory Status: Ambulatory With Help Assistance Device: Cane GaitFutures trader) Signed: 07/27/2023 5:31:34 PM By: Midge Aver MSN RN CNS WTA Entered By: Midge Aver on 07/26/2023 10:48:14 -------------------------------------------------------------------------------- Nutrition Risk Screening Details Patient Name: Date of Service: [name] 07/26/2023 10:30 A M Medical Record Number: [id number] Patient Account Number: [id number] Date of Birth/Sex: Treating RN: 01/13/1946 (77 y.o. [name] Primary Care Emy Angevine: [name] Other Clinician: Referring Caetano Oberhaus: Treating Dyanara Cozza/Extender: RO BSO N, MICHA EL Burt Knack Weeks in Treatment: 0 Height (in): 63 Weight (lbs): 141.8 Body Mass Index (BMI): 25.1 Nutrition Risk Screening Items Score Screening NUTRITION RISK SCREEN: I have an illness or condition that made me change the kind and/or amount of food I eat 0 No I eat fewer than two meals per day 0 No I eat few fruits and vegetables, or milk products 0 No I have three or more drinks of beer, liquor or wine almost every day 0 No I have tooth or mouth problems that make it hard for me to eat 0 No TYREANA, BALDERAZ ([id number]) [phone] [id number] Page 5 of 5 I don't always have enough money to buy the food I need 0 No I eat alone most of the time 0 No I take three or more different prescribed or over-the-counter drugs a day 1 Yes Without wanting to, I have lost or gained 10 pounds in the last six months 0 No I am not always physically able to shop, cook and/or feed myself 0 No Nutrition Protocols Good Risk Protocol 0 No interventions needed Moderate Risk Protocol High Risk Proctocol Risk Level: Good Risk Score: 1 Electronic Signature(s) Signed: 07/27/2023 5:31:34 PM By: Midge Aver MSN RN CNS WTA Entered By: Midge Aver on 07/26/2023 10:48:00

## 2023-07-31 ENCOUNTER — Ambulatory Visit: Payer: Medicare Other | Admitting: Physical Therapy

## 2023-08-01 ENCOUNTER — Ambulatory Visit (INDEPENDENT_AMBULATORY_CARE_PROVIDER_SITE_OTHER): Payer: Medicare Other | Admitting: Internal Medicine

## 2023-08-01 ENCOUNTER — Encounter: Payer: Self-pay | Admitting: Internal Medicine

## 2023-08-01 ENCOUNTER — Other Ambulatory Visit (INDEPENDENT_AMBULATORY_CARE_PROVIDER_SITE_OTHER): Payer: Medicare Other

## 2023-08-01 VITALS — BP 116/74 | HR 91 | Ht 64.0 in | Wt 146.0 lb

## 2023-08-01 DIAGNOSIS — J449 Chronic obstructive pulmonary disease, unspecified: Secondary | ICD-10-CM | POA: Diagnosis not present

## 2023-08-01 DIAGNOSIS — R54 Age-related physical debility: Secondary | ICD-10-CM | POA: Diagnosis not present

## 2023-08-01 DIAGNOSIS — D539 Nutritional anemia, unspecified: Secondary | ICD-10-CM | POA: Diagnosis not present

## 2023-08-01 DIAGNOSIS — R918 Other nonspecific abnormal finding of lung field: Secondary | ICD-10-CM

## 2023-08-01 LAB — CBC WITH DIFFERENTIAL/PLATELET
Basophils Absolute: 0 10*3/uL (ref 0.0–0.1)
Basophils Relative: 0.6 % (ref 0.0–3.0)
Eosinophils Absolute: 0.2 10*3/uL (ref 0.0–0.7)
Eosinophils Relative: 3.9 % (ref 0.0–5.0)
HCT: 37.7 % (ref 36.0–46.0)
Hemoglobin: 12.2 g/dL (ref 12.0–15.0)
Lymphocytes Relative: 7.4 % — ABNORMAL LOW (ref 12.0–46.0)
Lymphs Abs: 0.5 10*3/uL — ABNORMAL LOW (ref 0.7–4.0)
MCHC: 32.4 g/dL (ref 30.0–36.0)
MCV: 100.7 fL — ABNORMAL HIGH (ref 78.0–100.0)
Monocytes Absolute: 0.8 10*3/uL (ref 0.1–1.0)
Monocytes Relative: 11.8 % (ref 3.0–12.0)
Neutro Abs: 4.9 10*3/uL (ref 1.4–7.7)
Neutrophils Relative %: 76.3 % (ref 43.0–77.0)
Platelets: 110 10*3/uL — ABNORMAL LOW (ref 150.0–400.0)
RBC: 3.75 Mil/uL — ABNORMAL LOW (ref 3.87–5.11)
RDW: 14.9 % (ref 11.5–15.5)
WBC: 6.4 10*3/uL (ref 4.0–10.5)

## 2023-08-01 NOTE — Progress Notes (Signed)
handy with her in case of a future COPD exacerbation.    Results for LUCYNA, BALLEK (MRN [id number]) as of 03/13/2018 11:58  Ref. Range 11/29/2013 16:04 04/01/2016 10:10 03/13/2018 10:36  FEV1-Pre Latest Units: L 0.93 1.35 1.42  FEV1-%Pred-Pre Latest Units: % 34 55 59   Results for BUFORD, CATER (MRN [id number]) as of 03/13/2018 11:58  Ref. Range 03/13/2018 10:36  DLCO cor Latest Units: ml/min/mmHg 22.70  DLCO cor % pred Latest Units: % 84     CT chest Sept 4, 2019 Lungs/Pleura: Pleural thickening posteriorly in the right hemithorax is stable. There is no significant pleural effusion. There is stable subpleural scarring posteriorly in the right lower lobe. There is central airway thickening with scattered tree-in-bud nodularity in both lungs, similar to the most recent prior study. Nodular scarring in both upper lobes is stable. The right lower lobe perifissural nodule present on the most recent study has resolved. There is a stable 6 x 5 mm right lower lobe nodule on image 119/3. No new or enlarging pulmonary nodules.    IMPRESSION: 1. Generally stable chronic lung disease with pleuroparenchymal scarring and scattered nodularity. Most of these nodules have a tree-in-bud appearance, likely post infectious or inflammatory. The dominant right lower lobe nodule seen on the most recent study has resolved. No new or enlarging  nodules. 2. No acute chest findings. Healing subacute right-sided rib fractures. 3. Extensive Aortic Atherosclerosis (ICD10-I70.0).     Electronically Signed   By: Carey Bullocks [name]   On: 06/13/2018 14    ROS - per HPI  OV 01/22/2020  Subjective:  Patient ID: [name], female , DOB: 1946-03-23 , age 77 y.o. , MRN: [id number] , ADDRESS: [address] [place] [address]   01/22/2020 -   Chief Complaint  Patient presents with   Follow-up    Pt states she has been doing good since last visit and denies any complaints.     HPI [name] 77 y.o. -presents for follow-up.  Last seen in 2019.  After that because of the pandemic have not seen her.  She saw the nurse practitioner once.  The big interval changes that she has lost a lot of weight.  It is intentional because she is try to keep her self it.  From a functional standpoint COPD symptoms are much improved after weight loss.  She takes Spiriva Singulair and Breo.  However she is having significant back pain.  Other than that she feels fine.  She not able to do pulmonary rehab because of the back pain.  She has had a Covid vaccine  Other issues lung nodules.  Her last CT scan of the chest was in July 2020.  She has waxing and waning.  She is willing to have another CT scan of the chest at the appropriate time interval.      CAT COPD Symptom & Quality of Life Score (GSK trademark) 0 is no burden. 5 is highest burden 08/02/2017  10/31/2017  02/05/2018  06/27/2018 60+ # intentioal weight loss  Never Cough -> Cough all the time 1 2 4 1   No phlegm in chest -> Chest is full of phlegm 0 0 3 0  No chest tightness -> Chest feels very tight 1 0 3 0  No dyspnea for 1 flight stairs/hill -> Very dyspneic for 1 flight of stairs 5 4 5 4   No limitations for ADL at home -> Very limited with ADL at home 1 0  she had some chronic scarring at that area].  She was treated with 5-day Levaquin and a Medrol Dosepak but despite this other than subjective fevers are resolving she still has significant cough congestion and chest tightness without any orthopnea proximal nocturnal dyspnea.  COPD CAT score shows significant deterioration.  She is not feeling well.  Of note, she is intentionally losing weight because of weight watchers but she has not increased her fluid intake adequately.  She has quit all diet soda  Show slight increase in liver enzymes and also slight increase in creatinine compared to 6 months ago.   OV 03/13/2018  Chief Complaint  Patient presents with   Follow-up    Breathing is improved  since last OV. Still reports DOE and occasional cough. Denies chest tightness or wheezing.    [name]  presents for follow-up of her gold stage II /3 of COPD.  On triple inhaler therapy with Spiriva and Brio and also on Singulair   Overall in terms of her COPD: She is stable.  She is compliant with her inhalers.  She is not on oxygen therapy.  Her symptoms actually improved.  In the interim she was diagnosed with atrial fibrillation and she is placed on Xarelto.  She is frustrated because she has to be on 1 more extra medication.  She did have pulmonary function test that is documented below because it has been a while since we did one.  It actually shows improvement in the lung function.  She also had a CT scan of the chest because the last one was in 2015.  This shows scattered lung nodules with the largest one 8 mm in the right lower lobe.  The radiologist has described this is nonspecific in appearance.  She heard the result and she has been extremely anxious and upset.  She is worried that her colon cancer has metastasized or she has lung cancer.  Of note, I personally visualized the CT chest and agree with the findings   OV 06/27/2018  Subjective:  Patient ID: [name], female , DOB: [birth year] , age 77 y.o. , MRN: [id number] , ADDRESS: [address] [place] [address]   06/27/2018 -   Chief Complaint  Patient presents with   Follow-up    Pt states her breathing has been doing good since last visit. States she has been having some BP issues to the point she fainted and cracked some ribs.  Denies any problems with cough or CP.     HPI [name] 77 y.o. - follow-up for  - COPD: Overall this is stable with inhaler therapy. She has not had her flu shot. She wants to wait 2 weeks because she just had first shot of her shingles vaccine. She is up-to-date with her pneumonia vaccine. In the interim she has lost weight 60 pounds intentionally and this is improved on  Score  Multiple  Lung nodules: She had follow-up CT chest in September 2019 and compared to May 2019: One of the nodules is resolved to 16 mm and stable. She does have some rib fractures subacute and this is because she fell down. She's been having balance issues with the weight loss and orthostatic hypotension. Both of this is being managed by neurosurgery and primary care  Travel advice: She plans to have back surgery for her foot drop after this she plans to recover at her son's home near [place]. Therefore she wants a antibiotic and prednisone taper to be  Upper Abdomen: Abdominal aortic and systemic atherosclerotic calcification. Clips along the gallbladder fossa suggest prior cholecystectomy.   Musculoskeletal: Right rib deformities from old healed rib fractures. Thoracic spondylosis with multilevel bridging spurring. Chronic anterior wedge compression fracture at L1 especially along the inferior endplate.   IMPRESSION: 1. Generally worsened scattered nodularity and bandlike opacities in the lungs superimposed on a background of scattered tree-in-bud reticulonodular opacities. 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 In this setting of scattered nodularity, high specificity with regard to malignant versus infectious etiology of any individual nodule is not feasible on noncontrast CT. Surveillance is suggested. 2. Marked atherosclerosis. 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 Mild cardiomegaly. 3. Ascending thoracic aortic aneurysm 4.2 cm in diameter,  stable. Recommend annual imaging followup by CTA or MRA. This recommendation follows 2010 ACCF/AHA/AATS/ACR/ASA/SCA/SCAI/SIR/STS/SVM Guidelines for the Diagnosis and Management of Patients with Thoracic Aortic Disease. Circulation. 2010; 121: I347-Q259. Aortic aneurysm NOS (ICD10-I71.9) 4. Stable trace right pleural effusion. 5. Prominent main pulmonary artery compatible pulmonary arterial hypertension. 6. Cylindrical bronchiectasis in the right lower lobe.     Electronically Signed   By: Gaylyn Rong [name]   On: 01/21/2022 08:41        OV 05/24/2022  Subjective:  Patient ID: [name], female , DOB: 1946/07/08 , age 52 y.o. , MRN: [id number] , ADDRESS: [address] [place] [address] PCP [name], MD Patient Care Team: [name], MD as PCP - General (Family Medicine) Thurmon Fair, MD as PCP - Cardiology (Cardiology) [name], MD as Consulting Physician (Gastroenterology) Dorothy Puffer, MD as Consulting Physician (Radiation Oncology) Laurice Record, MD as Consulting Physician (Hematology and Oncology) Susa Griffins, MD (Inactive) as Consulting Physician (Cardiology) [name], MD as Consulting Physician (Pulmonary Disease) [name], MD (General Surgery) Thurmon Fair, MD as Consulting Physician (Cardiology) Mckinley Jewel, MD as Consulting Physician (Ophthalmology) [place], Amy, MD as Consulting Physician (Dermatology) Drema Dallas, DO as Consulting Physician (Neurology)  This Provider for this visit: Treatment Team:  Attending Provider: [name], MD    05/24/2022 -   Chief Complaint  Patient presents with   Follow-up    Pt is here today to discuss results of recent CT.     HPI [name] 77 y.o. -waxing and waning pulmonary nodules she had another CT scan that shows the same.  Currently feels relatively stable.  Respiratory status is stable for her COPD.  She did see Dr.  Dorris Fetch May 2023 and he felt biopsy would be nondiagnostic and recommended serial expectant approach.  She did have a most recent CT scan as documented below and it shows continued waxing and waning nodules.  Incidentally she was found to have positive ANA when I tested her for etiology of the nodules.  With strongly positive at 1: 1280.  She has upcoming appointment Dr. Sheliah Hatch in rheumatology.  I have asked her to keep it.  Indicated to her the nodules could be related to the positive ANA but the positive ANA could also be post-COVID.  CLINICAL DATA:  Multiple lung nodules.   EXAM: CT CHEST WITHOUT CONTRAST   TECHNIQUE: Multidetector CT imaging of the chest was performed following the standard protocol without IV contrast.   RADIATION DOSE REDUCTION: This exam was performed according to the departmental dose-optimization program which includes automated exposure control, adjustment of the mA and/or kV according to patient size and/or use of iterative reconstruction technique.  she had some chronic scarring at that area].  She was treated with 5-day Levaquin and a Medrol Dosepak but despite this other than subjective fevers are resolving she still has significant cough congestion and chest tightness without any orthopnea proximal nocturnal dyspnea.  COPD CAT score shows significant deterioration.  She is not feeling well.  Of note, she is intentionally losing weight because of weight watchers but she has not increased her fluid intake adequately.  She has quit all diet soda  Show slight increase in liver enzymes and also slight increase in creatinine compared to 6 months ago.   OV 03/13/2018  Chief Complaint  Patient presents with   Follow-up    Breathing is improved  since last OV. Still reports DOE and occasional cough. Denies chest tightness or wheezing.    [name]  presents for follow-up of her gold stage II /3 of COPD.  On triple inhaler therapy with Spiriva and Brio and also on Singulair   Overall in terms of her COPD: She is stable.  She is compliant with her inhalers.  She is not on oxygen therapy.  Her symptoms actually improved.  In the interim she was diagnosed with atrial fibrillation and she is placed on Xarelto.  She is frustrated because she has to be on 1 more extra medication.  She did have pulmonary function test that is documented below because it has been a while since we did one.  It actually shows improvement in the lung function.  She also had a CT scan of the chest because the last one was in 2015.  This shows scattered lung nodules with the largest one 8 mm in the right lower lobe.  The radiologist has described this is nonspecific in appearance.  She heard the result and she has been extremely anxious and upset.  She is worried that her colon cancer has metastasized or she has lung cancer.  Of note, I personally visualized the CT chest and agree with the findings   OV 06/27/2018  Subjective:  Patient ID: [name], female , DOB: [birth year] , age 77 y.o. , MRN: [id number] , ADDRESS: [address] [place] [address]   06/27/2018 -   Chief Complaint  Patient presents with   Follow-up    Pt states her breathing has been doing good since last visit. States she has been having some BP issues to the point she fainted and cracked some ribs.  Denies any problems with cough or CP.     HPI [name] 77 y.o. - follow-up for  - COPD: Overall this is stable with inhaler therapy. She has not had her flu shot. She wants to wait 2 weeks because she just had first shot of her shingles vaccine. She is up-to-date with her pneumonia vaccine. In the interim she has lost weight 60 pounds intentionally and this is improved on  Score  Multiple  Lung nodules: She had follow-up CT chest in September 2019 and compared to May 2019: One of the nodules is resolved to 16 mm and stable. She does have some rib fractures subacute and this is because she fell down. She's been having balance issues with the weight loss and orthostatic hypotension. Both of this is being managed by neurosurgery and primary care  Travel advice: She plans to have back surgery for her foot drop after this she plans to recover at her son's home near [place]. Therefore she wants a antibiotic and prednisone taper to be  Upper Abdomen: Abdominal aortic and systemic atherosclerotic calcification. Clips along the gallbladder fossa suggest prior cholecystectomy.   Musculoskeletal: Right rib deformities from old healed rib fractures. Thoracic spondylosis with multilevel bridging spurring. Chronic anterior wedge compression fracture at L1 especially along the inferior endplate.   IMPRESSION: 1. Generally worsened scattered nodularity and bandlike opacities in the lungs superimposed on a background of scattered tree-in-bud reticulonodular opacities. 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 In this setting of scattered nodularity, high specificity with regard to malignant versus infectious etiology of any individual nodule is not feasible on noncontrast CT. Surveillance is suggested. 2. Marked atherosclerosis. 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 Mild cardiomegaly. 3. Ascending thoracic aortic aneurysm 4.2 cm in diameter,  stable. Recommend annual imaging followup by CTA or MRA. This recommendation follows 2010 ACCF/AHA/AATS/ACR/ASA/SCA/SCAI/SIR/STS/SVM Guidelines for the Diagnosis and Management of Patients with Thoracic Aortic Disease. Circulation. 2010; 121: I347-Q259. Aortic aneurysm NOS (ICD10-I71.9) 4. Stable trace right pleural effusion. 5. Prominent main pulmonary artery compatible pulmonary arterial hypertension. 6. Cylindrical bronchiectasis in the right lower lobe.     Electronically Signed   By: Gaylyn Rong [name]   On: 01/21/2022 08:41        OV 05/24/2022  Subjective:  Patient ID: [name], female , DOB: 1946/07/08 , age 52 y.o. , MRN: [id number] , ADDRESS: [address] [place] [address] PCP [name], MD Patient Care Team: [name], MD as PCP - General (Family Medicine) Thurmon Fair, MD as PCP - Cardiology (Cardiology) [name], MD as Consulting Physician (Gastroenterology) Dorothy Puffer, MD as Consulting Physician (Radiation Oncology) Laurice Record, MD as Consulting Physician (Hematology and Oncology) Susa Griffins, MD (Inactive) as Consulting Physician (Cardiology) [name], MD as Consulting Physician (Pulmonary Disease) [name], MD (General Surgery) Thurmon Fair, MD as Consulting Physician (Cardiology) Mckinley Jewel, MD as Consulting Physician (Ophthalmology) [place], Amy, MD as Consulting Physician (Dermatology) Drema Dallas, DO as Consulting Physician (Neurology)  This Provider for this visit: Treatment Team:  Attending Provider: [name], MD    05/24/2022 -   Chief Complaint  Patient presents with   Follow-up    Pt is here today to discuss results of recent CT.     HPI [name] 77 y.o. -waxing and waning pulmonary nodules she had another CT scan that shows the same.  Currently feels relatively stable.  Respiratory status is stable for her COPD.  She did see Dr.  Dorris Fetch May 2023 and he felt biopsy would be nondiagnostic and recommended serial expectant approach.  She did have a most recent CT scan as documented below and it shows continued waxing and waning nodules.  Incidentally she was found to have positive ANA when I tested her for etiology of the nodules.  With strongly positive at 1: 1280.  She has upcoming appointment Dr. Sheliah Hatch in rheumatology.  I have asked her to keep it.  Indicated to her the nodules could be related to the positive ANA but the positive ANA could also be post-COVID.  CLINICAL DATA:  Multiple lung nodules.   EXAM: CT CHEST WITHOUT CONTRAST   TECHNIQUE: Multidetector CT imaging of the chest was performed following the standard protocol without IV contrast.   RADIATION DOSE REDUCTION: This exam was performed according to the departmental dose-optimization program which includes automated exposure control, adjustment of the mA and/or kV according to patient size and/or use of iterative reconstruction technique.  Upper Abdomen: Abdominal aortic and systemic atherosclerotic calcification. Clips along the gallbladder fossa suggest prior cholecystectomy.   Musculoskeletal: Right rib deformities from old healed rib fractures. Thoracic spondylosis with multilevel bridging spurring. Chronic anterior wedge compression fracture at L1 especially along the inferior endplate.   IMPRESSION: 1. Generally worsened scattered nodularity and bandlike opacities in the lungs superimposed on a background of scattered tree-in-bud reticulonodular opacities. 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 In this setting of scattered nodularity, high specificity with regard to malignant versus infectious etiology of any individual nodule is not feasible on noncontrast CT. Surveillance is suggested. 2. Marked atherosclerosis. 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 Mild cardiomegaly. 3. Ascending thoracic aortic aneurysm 4.2 cm in diameter,  stable. Recommend annual imaging followup by CTA or MRA. This recommendation follows 2010 ACCF/AHA/AATS/ACR/ASA/SCA/SCAI/SIR/STS/SVM Guidelines for the Diagnosis and Management of Patients with Thoracic Aortic Disease. Circulation. 2010; 121: I347-Q259. Aortic aneurysm NOS (ICD10-I71.9) 4. Stable trace right pleural effusion. 5. Prominent main pulmonary artery compatible pulmonary arterial hypertension. 6. Cylindrical bronchiectasis in the right lower lobe.     Electronically Signed   By: Gaylyn Rong [name]   On: 01/21/2022 08:41        OV 05/24/2022  Subjective:  Patient ID: [name], female , DOB: 1946/07/08 , age 52 y.o. , MRN: [id number] , ADDRESS: [address] [place] [address] PCP [name], MD Patient Care Team: [name], MD as PCP - General (Family Medicine) Thurmon Fair, MD as PCP - Cardiology (Cardiology) [name], MD as Consulting Physician (Gastroenterology) Dorothy Puffer, MD as Consulting Physician (Radiation Oncology) Laurice Record, MD as Consulting Physician (Hematology and Oncology) Susa Griffins, MD (Inactive) as Consulting Physician (Cardiology) [name], MD as Consulting Physician (Pulmonary Disease) [name], MD (General Surgery) Thurmon Fair, MD as Consulting Physician (Cardiology) Mckinley Jewel, MD as Consulting Physician (Ophthalmology) [place], Amy, MD as Consulting Physician (Dermatology) Drema Dallas, DO as Consulting Physician (Neurology)  This Provider for this visit: Treatment Team:  Attending Provider: [name], MD    05/24/2022 -   Chief Complaint  Patient presents with   Follow-up    Pt is here today to discuss results of recent CT.     HPI [name] 77 y.o. -waxing and waning pulmonary nodules she had another CT scan that shows the same.  Currently feels relatively stable.  Respiratory status is stable for her COPD.  She did see Dr.  Dorris Fetch May 2023 and he felt biopsy would be nondiagnostic and recommended serial expectant approach.  She did have a most recent CT scan as documented below and it shows continued waxing and waning nodules.  Incidentally she was found to have positive ANA when I tested her for etiology of the nodules.  With strongly positive at 1: 1280.  She has upcoming appointment Dr. Sheliah Hatch in rheumatology.  I have asked her to keep it.  Indicated to her the nodules could be related to the positive ANA but the positive ANA could also be post-COVID.  CLINICAL DATA:  Multiple lung nodules.   EXAM: CT CHEST WITHOUT CONTRAST   TECHNIQUE: Multidetector CT imaging of the chest was performed following the standard protocol without IV contrast.   RADIATION DOSE REDUCTION: This exam was performed according to the departmental dose-optimization program which includes automated exposure control, adjustment of the mA and/or kV according to patient size and/or use of iterative reconstruction technique.  handy with her in case of a future COPD exacerbation.    Results for LUCYNA, BALLEK (MRN [id number]) as of 03/13/2018 11:58  Ref. Range 11/29/2013 16:04 04/01/2016 10:10 03/13/2018 10:36  FEV1-Pre Latest Units: L 0.93 1.35 1.42  FEV1-%Pred-Pre Latest Units: % 34 55 59   Results for BUFORD, CATER (MRN [id number]) as of 03/13/2018 11:58  Ref. Range 03/13/2018 10:36  DLCO cor Latest Units: ml/min/mmHg 22.70  DLCO cor % pred Latest Units: % 84     CT chest Sept 4, 2019 Lungs/Pleura: Pleural thickening posteriorly in the right hemithorax is stable. There is no significant pleural effusion. There is stable subpleural scarring posteriorly in the right lower lobe. There is central airway thickening with scattered tree-in-bud nodularity in both lungs, similar to the most recent prior study. Nodular scarring in both upper lobes is stable. The right lower lobe perifissural nodule present on the most recent study has resolved. There is a stable 6 x 5 mm right lower lobe nodule on image 119/3. No new or enlarging pulmonary nodules.    IMPRESSION: 1. Generally stable chronic lung disease with pleuroparenchymal scarring and scattered nodularity. Most of these nodules have a tree-in-bud appearance, likely post infectious or inflammatory. The dominant right lower lobe nodule seen on the most recent study has resolved. No new or enlarging  nodules. 2. No acute chest findings. Healing subacute right-sided rib fractures. 3. Extensive Aortic Atherosclerosis (ICD10-I70.0).     Electronically Signed   By: Carey Bullocks [name]   On: 06/13/2018 14    ROS - per HPI  OV 01/22/2020  Subjective:  Patient ID: [name], female , DOB: 1946-03-23 , age 77 y.o. , MRN: [id number] , ADDRESS: [address] [place] [address]   01/22/2020 -   Chief Complaint  Patient presents with   Follow-up    Pt states she has been doing good since last visit and denies any complaints.     HPI [name] 77 y.o. -presents for follow-up.  Last seen in 2019.  After that because of the pandemic have not seen her.  She saw the nurse practitioner once.  The big interval changes that she has lost a lot of weight.  It is intentional because she is try to keep her self it.  From a functional standpoint COPD symptoms are much improved after weight loss.  She takes Spiriva Singulair and Breo.  However she is having significant back pain.  Other than that she feels fine.  She not able to do pulmonary rehab because of the back pain.  She has had a Covid vaccine  Other issues lung nodules.  Her last CT scan of the chest was in July 2020.  She has waxing and waning.  She is willing to have another CT scan of the chest at the appropriate time interval.      CAT COPD Symptom & Quality of Life Score (GSK trademark) 0 is no burden. 5 is highest burden 08/02/2017  10/31/2017  02/05/2018  06/27/2018 60+ # intentioal weight loss  Never Cough -> Cough all the time 1 2 4 1   No phlegm in chest -> Chest is full of phlegm 0 0 3 0  No chest tightness -> Chest feels very tight 1 0 3 0  No dyspnea for 1 flight stairs/hill -> Very dyspneic for 1 flight of stairs 5 4 5 4   No limitations for ADL at home -> Very limited with ADL at home 1 0  she had some chronic scarring at that area].  She was treated with 5-day Levaquin and a Medrol Dosepak but despite this other than subjective fevers are resolving she still has significant cough congestion and chest tightness without any orthopnea proximal nocturnal dyspnea.  COPD CAT score shows significant deterioration.  She is not feeling well.  Of note, she is intentionally losing weight because of weight watchers but she has not increased her fluid intake adequately.  She has quit all diet soda  Show slight increase in liver enzymes and also slight increase in creatinine compared to 6 months ago.   OV 03/13/2018  Chief Complaint  Patient presents with   Follow-up    Breathing is improved  since last OV. Still reports DOE and occasional cough. Denies chest tightness or wheezing.    [name]  presents for follow-up of her gold stage II /3 of COPD.  On triple inhaler therapy with Spiriva and Brio and also on Singulair   Overall in terms of her COPD: She is stable.  She is compliant with her inhalers.  She is not on oxygen therapy.  Her symptoms actually improved.  In the interim she was diagnosed with atrial fibrillation and she is placed on Xarelto.  She is frustrated because she has to be on 1 more extra medication.  She did have pulmonary function test that is documented below because it has been a while since we did one.  It actually shows improvement in the lung function.  She also had a CT scan of the chest because the last one was in 2015.  This shows scattered lung nodules with the largest one 8 mm in the right lower lobe.  The radiologist has described this is nonspecific in appearance.  She heard the result and she has been extremely anxious and upset.  She is worried that her colon cancer has metastasized or she has lung cancer.  Of note, I personally visualized the CT chest and agree with the findings   OV 06/27/2018  Subjective:  Patient ID: [name], female , DOB: [birth year] , age 77 y.o. , MRN: [id number] , ADDRESS: [address] [place] [address]   06/27/2018 -   Chief Complaint  Patient presents with   Follow-up    Pt states her breathing has been doing good since last visit. States she has been having some BP issues to the point she fainted and cracked some ribs.  Denies any problems with cough or CP.     HPI [name] 77 y.o. - follow-up for  - COPD: Overall this is stable with inhaler therapy. She has not had her flu shot. She wants to wait 2 weeks because she just had first shot of her shingles vaccine. She is up-to-date with her pneumonia vaccine. In the interim she has lost weight 60 pounds intentionally and this is improved on  Score  Multiple  Lung nodules: She had follow-up CT chest in September 2019 and compared to May 2019: One of the nodules is resolved to 16 mm and stable. She does have some rib fractures subacute and this is because she fell down. She's been having balance issues with the weight loss and orthostatic hypotension. Both of this is being managed by neurosurgery and primary care  Travel advice: She plans to have back surgery for her foot drop after this she plans to recover at her son's home near [place]. Therefore she wants a antibiotic and prednisone taper to be  5 3  Confident leaving home -> Not at all confident leaving home 0 0 3 1  Sleep soundly -> Do not sleep  soundly because of lung condition 4 4 3 3   Lots of Energy -> No energy at all 2 3 5 4   TOTAL Score (max 40)  14 13 31 16    CAT Score 01/22/2020  Total CAT Score 9      ROS - per HPI    OV 07/14/2020  Subjective:  Patient ID: Makynzie Quin [name], female , DOB: [birth year] , age 39 y.o. , MRN: [id number] , ADDRESS: [address] 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 -presents for follow-up of her COPD.  Last seen in early part of this year.  She is supposed to have a follow-up CT scan of the chest but this has not happened.  Patient tells me that she wants to hold off on getting the CT scan of the chest right now but will do it at the time of follow-up.  Last CT scan of the chest was in July 2020.  At this point in time she continues on Spiriva, Breo and Singulair.  She feels well.  She has been social distancing.  She has had her Covid vaccine but will have a booster next week.  She will have a flu shot today but at the [facility] clinic.  There are no active complaints.  She is interested in consolidating her Spiriva and Breo and to Trelegy.  She denies any recent exacerbations.   CAT Score 07/14/2020 01/22/2020  Total CAT Score 6 9     No flowsheet data found.   No flowsheet data found.      OV 02/14/2022  Subjective:  Patient ID: Mattisyn Quin [name], female , DOB: Apr 06, 1946 , age 73 y.o. , MRN: [id number] , ADDRESS: [address] [place] [address] PCP [name], MD Patient Care Team: [name], MD as PCP - General (Family Medicine) Thurmon Fair, MD as PCP - Cardiology (Cardiology) [name], MD as Consulting Physician (Gastroenterology) Dorothy Puffer, MD as Consulting  Physician (Radiation Oncology) Laurice Record, MD as Consulting Physician (Hematology and Oncology) Susa Griffins, MD (Inactive) as Consulting Physician (Cardiology) [name], MD as Consulting Physician (Pulmonary Disease) [name], MD (General Surgery) Thurmon Fair, MD as Consulting Physician (Cardiology) Mckinley Jewel, MD as Consulting Physician (Ophthalmology) [place], Amy, MD as Consulting Physician (Dermatology) Drema Dallas, DO as Consulting Physician (Neurology)  This Provider for this visit: Treatment Team:  Attending Provider: [name], MD    02/14/2022 -   Chief Complaint  Patient presents with   Follow-up    Pt is here following up after recent CT. Pt states she has been doing okay since last visit.   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 Multiple lung nodules with waxing and waning quality  -last CT scanApril 2023 and Nov 2022  COVID-19 in February 2023  HPI [name] 77 y.o. -presents for follow-up to discuss her CT results.  Shortly after the CT scan was done she was alarmed with the results.  She is really worried  Upper Abdomen: Abdominal aortic and systemic atherosclerotic calcification. Clips along the gallbladder fossa suggest prior cholecystectomy.   Musculoskeletal: Right rib deformities from old healed rib fractures. Thoracic spondylosis with multilevel bridging spurring. Chronic anterior wedge compression fracture at L1 especially along the inferior endplate.   IMPRESSION: 1. Generally worsened scattered nodularity and bandlike opacities in the lungs superimposed on a background of scattered tree-in-bud reticulonodular opacities. 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 In this setting of scattered nodularity, high specificity with regard to malignant versus infectious etiology of any individual nodule is not feasible on noncontrast CT. Surveillance is suggested. 2. Marked atherosclerosis. 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 Mild cardiomegaly. 3. Ascending thoracic aortic aneurysm 4.2 cm in diameter,  stable. Recommend annual imaging followup by CTA or MRA. This recommendation follows 2010 ACCF/AHA/AATS/ACR/ASA/SCA/SCAI/SIR/STS/SVM Guidelines for the Diagnosis and Management of Patients with Thoracic Aortic Disease. Circulation. 2010; 121: I347-Q259. Aortic aneurysm NOS (ICD10-I71.9) 4. Stable trace right pleural effusion. 5. Prominent main pulmonary artery compatible pulmonary arterial hypertension. 6. Cylindrical bronchiectasis in the right lower lobe.     Electronically Signed   By: Gaylyn Rong [name]   On: 01/21/2022 08:41        OV 05/24/2022  Subjective:  Patient ID: [name], female , DOB: 1946/07/08 , age 52 y.o. , MRN: [id number] , ADDRESS: [address] [place] [address] PCP [name], MD Patient Care Team: [name], MD as PCP - General (Family Medicine) Thurmon Fair, MD as PCP - Cardiology (Cardiology) [name], MD as Consulting Physician (Gastroenterology) Dorothy Puffer, MD as Consulting Physician (Radiation Oncology) Laurice Record, MD as Consulting Physician (Hematology and Oncology) Susa Griffins, MD (Inactive) as Consulting Physician (Cardiology) [name], MD as Consulting Physician (Pulmonary Disease) [name], MD (General Surgery) Thurmon Fair, MD as Consulting Physician (Cardiology) Mckinley Jewel, MD as Consulting Physician (Ophthalmology) [place], Amy, MD as Consulting Physician (Dermatology) Drema Dallas, DO as Consulting Physician (Neurology)  This Provider for this visit: Treatment Team:  Attending Provider: [name], MD    05/24/2022 -   Chief Complaint  Patient presents with   Follow-up    Pt is here today to discuss results of recent CT.     HPI [name] 77 y.o. -waxing and waning pulmonary nodules she had another CT scan that shows the same.  Currently feels relatively stable.  Respiratory status is stable for her COPD.  She did see Dr.  Dorris Fetch May 2023 and he felt biopsy would be nondiagnostic and recommended serial expectant approach.  She did have a most recent CT scan as documented below and it shows continued waxing and waning nodules.  Incidentally she was found to have positive ANA when I tested her for etiology of the nodules.  With strongly positive at 1: 1280.  She has upcoming appointment Dr. Sheliah Hatch in rheumatology.  I have asked her to keep it.  Indicated to her the nodules could be related to the positive ANA but the positive ANA could also be post-COVID.  CLINICAL DATA:  Multiple lung nodules.   EXAM: CT CHEST WITHOUT CONTRAST   TECHNIQUE: Multidetector CT imaging of the chest was performed following the standard protocol without IV contrast.   RADIATION DOSE REDUCTION: This exam was performed according to the departmental dose-optimization program which includes automated exposure control, adjustment of the mA and/or kV according to patient size and/or use of iterative reconstruction technique.  Upper Abdomen: Abdominal aortic and systemic atherosclerotic calcification. Clips along the gallbladder fossa suggest prior cholecystectomy.   Musculoskeletal: Right rib deformities from old healed rib fractures. Thoracic spondylosis with multilevel bridging spurring. Chronic anterior wedge compression fracture at L1 especially along the inferior endplate.   IMPRESSION: 1. Generally worsened scattered nodularity and bandlike opacities in the lungs superimposed on a background of scattered tree-in-bud reticulonodular opacities. 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 In this setting of scattered nodularity, high specificity with regard to malignant versus infectious etiology of any individual nodule is not feasible on noncontrast CT. Surveillance is suggested. 2. Marked atherosclerosis. 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 Mild cardiomegaly. 3. Ascending thoracic aortic aneurysm 4.2 cm in diameter,  stable. Recommend annual imaging followup by CTA or MRA. This recommendation follows 2010 ACCF/AHA/AATS/ACR/ASA/SCA/SCAI/SIR/STS/SVM Guidelines for the Diagnosis and Management of Patients with Thoracic Aortic Disease. Circulation. 2010; 121: I347-Q259. Aortic aneurysm NOS (ICD10-I71.9) 4. Stable trace right pleural effusion. 5. Prominent main pulmonary artery compatible pulmonary arterial hypertension. 6. Cylindrical bronchiectasis in the right lower lobe.     Electronically Signed   By: Gaylyn Rong [name]   On: 01/21/2022 08:41        OV 05/24/2022  Subjective:  Patient ID: [name], female , DOB: 1946/07/08 , age 52 y.o. , MRN: [id number] , ADDRESS: [address] [place] [address] PCP [name], MD Patient Care Team: [name], MD as PCP - General (Family Medicine) Thurmon Fair, MD as PCP - Cardiology (Cardiology) [name], MD as Consulting Physician (Gastroenterology) Dorothy Puffer, MD as Consulting Physician (Radiation Oncology) Laurice Record, MD as Consulting Physician (Hematology and Oncology) Susa Griffins, MD (Inactive) as Consulting Physician (Cardiology) [name], MD as Consulting Physician (Pulmonary Disease) [name], MD (General Surgery) Thurmon Fair, MD as Consulting Physician (Cardiology) Mckinley Jewel, MD as Consulting Physician (Ophthalmology) [place], Amy, MD as Consulting Physician (Dermatology) Drema Dallas, DO as Consulting Physician (Neurology)  This Provider for this visit: Treatment Team:  Attending Provider: [name], MD    05/24/2022 -   Chief Complaint  Patient presents with   Follow-up    Pt is here today to discuss results of recent CT.     HPI [name] 77 y.o. -waxing and waning pulmonary nodules she had another CT scan that shows the same.  Currently feels relatively stable.  Respiratory status is stable for her COPD.  She did see Dr.  Dorris Fetch May 2023 and he felt biopsy would be nondiagnostic and recommended serial expectant approach.  She did have a most recent CT scan as documented below and it shows continued waxing and waning nodules.  Incidentally she was found to have positive ANA when I tested her for etiology of the nodules.  With strongly positive at 1: 1280.  She has upcoming appointment Dr. Sheliah Hatch in rheumatology.  I have asked her to keep it.  Indicated to her the nodules could be related to the positive ANA but the positive ANA could also be post-COVID.  CLINICAL DATA:  Multiple lung nodules.   EXAM: CT CHEST WITHOUT CONTRAST   TECHNIQUE: Multidetector CT imaging of the chest was performed following the standard protocol without IV contrast.   RADIATION DOSE REDUCTION: This exam was performed according to the departmental dose-optimization program which includes automated exposure control, adjustment of the mA and/or kV according to patient size and/or use of iterative reconstruction technique.  she had some chronic scarring at that area].  She was treated with 5-day Levaquin and a Medrol Dosepak but despite this other than subjective fevers are resolving she still has significant cough congestion and chest tightness without any orthopnea proximal nocturnal dyspnea.  COPD CAT score shows significant deterioration.  She is not feeling well.  Of note, she is intentionally losing weight because of weight watchers but she has not increased her fluid intake adequately.  She has quit all diet soda  Show slight increase in liver enzymes and also slight increase in creatinine compared to 6 months ago.   OV 03/13/2018  Chief Complaint  Patient presents with   Follow-up    Breathing is improved  since last OV. Still reports DOE and occasional cough. Denies chest tightness or wheezing.    [name]  presents for follow-up of her gold stage II /3 of COPD.  On triple inhaler therapy with Spiriva and Brio and also on Singulair   Overall in terms of her COPD: She is stable.  She is compliant with her inhalers.  She is not on oxygen therapy.  Her symptoms actually improved.  In the interim she was diagnosed with atrial fibrillation and she is placed on Xarelto.  She is frustrated because she has to be on 1 more extra medication.  She did have pulmonary function test that is documented below because it has been a while since we did one.  It actually shows improvement in the lung function.  She also had a CT scan of the chest because the last one was in 2015.  This shows scattered lung nodules with the largest one 8 mm in the right lower lobe.  The radiologist has described this is nonspecific in appearance.  She heard the result and she has been extremely anxious and upset.  She is worried that her colon cancer has metastasized or she has lung cancer.  Of note, I personally visualized the CT chest and agree with the findings   OV 06/27/2018  Subjective:  Patient ID: [name], female , DOB: [birth year] , age 77 y.o. , MRN: [id number] , ADDRESS: [address] [place] [address]   06/27/2018 -   Chief Complaint  Patient presents with   Follow-up    Pt states her breathing has been doing good since last visit. States she has been having some BP issues to the point she fainted and cracked some ribs.  Denies any problems with cough or CP.     HPI [name] 77 y.o. - follow-up for  - COPD: Overall this is stable with inhaler therapy. She has not had her flu shot. She wants to wait 2 weeks because she just had first shot of her shingles vaccine. She is up-to-date with her pneumonia vaccine. In the interim she has lost weight 60 pounds intentionally and this is improved on  Score  Multiple  Lung nodules: She had follow-up CT chest in September 2019 and compared to May 2019: One of the nodules is resolved to 16 mm and stable. She does have some rib fractures subacute and this is because she fell down. She's been having balance issues with the weight loss and orthostatic hypotension. Both of this is being managed by neurosurgery and primary care  Travel advice: She plans to have back surgery for her foot drop after this she plans to recover at her son's home near [place]. Therefore she wants a antibiotic and prednisone taper to be  she had some chronic scarring at that area].  She was treated with 5-day Levaquin and a Medrol Dosepak but despite this other than subjective fevers are resolving she still has significant cough congestion and chest tightness without any orthopnea proximal nocturnal dyspnea.  COPD CAT score shows significant deterioration.  She is not feeling well.  Of note, she is intentionally losing weight because of weight watchers but she has not increased her fluid intake adequately.  She has quit all diet soda  Show slight increase in liver enzymes and also slight increase in creatinine compared to 6 months ago.   OV 03/13/2018  Chief Complaint  Patient presents with   Follow-up    Breathing is improved  since last OV. Still reports DOE and occasional cough. Denies chest tightness or wheezing.    [name]  presents for follow-up of her gold stage II /3 of COPD.  On triple inhaler therapy with Spiriva and Brio and also on Singulair   Overall in terms of her COPD: She is stable.  She is compliant with her inhalers.  She is not on oxygen therapy.  Her symptoms actually improved.  In the interim she was diagnosed with atrial fibrillation and she is placed on Xarelto.  She is frustrated because she has to be on 1 more extra medication.  She did have pulmonary function test that is documented below because it has been a while since we did one.  It actually shows improvement in the lung function.  She also had a CT scan of the chest because the last one was in 2015.  This shows scattered lung nodules with the largest one 8 mm in the right lower lobe.  The radiologist has described this is nonspecific in appearance.  She heard the result and she has been extremely anxious and upset.  She is worried that her colon cancer has metastasized or she has lung cancer.  Of note, I personally visualized the CT chest and agree with the findings   OV 06/27/2018  Subjective:  Patient ID: [name], female , DOB: [birth year] , age 77 y.o. , MRN: [id number] , ADDRESS: [address] [place] [address]   06/27/2018 -   Chief Complaint  Patient presents with   Follow-up    Pt states her breathing has been doing good since last visit. States she has been having some BP issues to the point she fainted and cracked some ribs.  Denies any problems with cough or CP.     HPI [name] 77 y.o. - follow-up for  - COPD: Overall this is stable with inhaler therapy. She has not had her flu shot. She wants to wait 2 weeks because she just had first shot of her shingles vaccine. She is up-to-date with her pneumonia vaccine. In the interim she has lost weight 60 pounds intentionally and this is improved on  Score  Multiple  Lung nodules: She had follow-up CT chest in September 2019 and compared to May 2019: One of the nodules is resolved to 16 mm and stable. She does have some rib fractures subacute and this is because she fell down. She's been having balance issues with the weight loss and orthostatic hypotension. Both of this is being managed by neurosurgery and primary care  Travel advice: She plans to have back surgery for her foot drop after this she plans to recover at her son's home near [place]. Therefore she wants a antibiotic and prednisone taper to be  she had some chronic scarring at that area].  She was treated with 5-day Levaquin and a Medrol Dosepak but despite this other than subjective fevers are resolving she still has significant cough congestion and chest tightness without any orthopnea proximal nocturnal dyspnea.  COPD CAT score shows significant deterioration.  She is not feeling well.  Of note, she is intentionally losing weight because of weight watchers but she has not increased her fluid intake adequately.  She has quit all diet soda  Show slight increase in liver enzymes and also slight increase in creatinine compared to 6 months ago.   OV 03/13/2018  Chief Complaint  Patient presents with   Follow-up    Breathing is improved  since last OV. Still reports DOE and occasional cough. Denies chest tightness or wheezing.    [name]  presents for follow-up of her gold stage II /3 of COPD.  On triple inhaler therapy with Spiriva and Brio and also on Singulair   Overall in terms of her COPD: She is stable.  She is compliant with her inhalers.  She is not on oxygen therapy.  Her symptoms actually improved.  In the interim she was diagnosed with atrial fibrillation and she is placed on Xarelto.  She is frustrated because she has to be on 1 more extra medication.  She did have pulmonary function test that is documented below because it has been a while since we did one.  It actually shows improvement in the lung function.  She also had a CT scan of the chest because the last one was in 2015.  This shows scattered lung nodules with the largest one 8 mm in the right lower lobe.  The radiologist has described this is nonspecific in appearance.  She heard the result and she has been extremely anxious and upset.  She is worried that her colon cancer has metastasized or she has lung cancer.  Of note, I personally visualized the CT chest and agree with the findings   OV 06/27/2018  Subjective:  Patient ID: [name], female , DOB: [birth year] , age 77 y.o. , MRN: [id number] , ADDRESS: [address] [place] [address]   06/27/2018 -   Chief Complaint  Patient presents with   Follow-up    Pt states her breathing has been doing good since last visit. States she has been having some BP issues to the point she fainted and cracked some ribs.  Denies any problems with cough or CP.     HPI [name] 77 y.o. - follow-up for  - COPD: Overall this is stable with inhaler therapy. She has not had her flu shot. She wants to wait 2 weeks because she just had first shot of her shingles vaccine. She is up-to-date with her pneumonia vaccine. In the interim she has lost weight 60 pounds intentionally and this is improved on  Score  Multiple  Lung nodules: She had follow-up CT chest in September 2019 and compared to May 2019: One of the nodules is resolved to 16 mm and stable. She does have some rib fractures subacute and this is because she fell down. She's been having balance issues with the weight loss and orthostatic hypotension. Both of this is being managed by neurosurgery and primary care  Travel advice: She plans to have back surgery for her foot drop after this she plans to recover at her son's home near [place]. Therefore she wants a antibiotic and prednisone taper to be  5 3  Confident leaving home -> Not at all confident leaving home 0 0 3 1  Sleep soundly -> Do not sleep  soundly because of lung condition 4 4 3 3   Lots of Energy -> No energy at all 2 3 5 4   TOTAL Score (max 40)  14 13 31 16    CAT Score 01/22/2020  Total CAT Score 9      ROS - per HPI    OV 07/14/2020  Subjective:  Patient ID: Makynzie Quin [name], female , DOB: [birth year] , age 39 y.o. , MRN: [id number] , ADDRESS: [address] 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 -presents for follow-up of her COPD.  Last seen in early part of this year.  She is supposed to have a follow-up CT scan of the chest but this has not happened.  Patient tells me that she wants to hold off on getting the CT scan of the chest right now but will do it at the time of follow-up.  Last CT scan of the chest was in July 2020.  At this point in time she continues on Spiriva, Breo and Singulair.  She feels well.  She has been social distancing.  She has had her Covid vaccine but will have a booster next week.  She will have a flu shot today but at the Coumadin clinic.  There are no active complaints.  She is interested in consolidating her Spiriva and Breo and to Trelegy.  She denies any recent exacerbations.   CAT Score 07/14/2020 01/22/2020  Total CAT Score 6 9     No flowsheet data found.   No flowsheet data found.      OV 02/14/2022  Subjective:  Patient ID: Mattisyn Quin [name], female , DOB: Apr 06, 1946 , age 73 y.o. , MRN: [id number] , ADDRESS: [address] [place] [address] PCP [name], MD Patient Care Team: [name], MD as PCP - General (Family Medicine) Thurmon Fair, MD as PCP - Cardiology (Cardiology) [name], MD as Consulting Physician (Gastroenterology) Dorothy Puffer, MD as Consulting  Physician (Radiation Oncology) Laurice Record, MD as Consulting Physician (Hematology and Oncology) Susa Griffins, MD (Inactive) as Consulting Physician (Cardiology) [name], MD as Consulting Physician (Pulmonary Disease) [name], MD (General Surgery) Thurmon Fair, MD as Consulting Physician (Cardiology) Mckinley Jewel, MD as Consulting Physician (Ophthalmology) [place], Amy, MD as Consulting Physician (Dermatology) Drema Dallas, DO as Consulting Physician (Neurology)  This Provider for this visit: Treatment Team:  Attending Provider: [name], MD    02/14/2022 -   Chief Complaint  Patient presents with   Follow-up    Pt is here following up after recent CT. Pt states she has been doing okay since last visit.   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 Multiple lung nodules with waxing and waning quality  -last CT scanApril 2023 and Nov 2022  COVID-19 in February 2023  HPI [name] 77 y.o. -presents for follow-up to discuss her CT results.  Shortly after the CT scan was done she was alarmed with the results.  She is really worried  5 3  Confident leaving home -> Not at all confident leaving home 0 0 3 1  Sleep soundly -> Do not sleep  soundly because of lung condition 4 4 3 3   Lots of Energy -> No energy at all 2 3 5 4   TOTAL Score (max 40)  14 13 31 16    CAT Score 01/22/2020  Total CAT Score 9      ROS - per HPI    OV 07/14/2020  Subjective:  Patient ID: Makynzie Quin [name], female , DOB: [birth year] , age 39 y.o. , MRN: [id number] , ADDRESS: [address] 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 -presents for follow-up of her COPD.  Last seen in early part of this year.  She is supposed to have a follow-up CT scan of the chest but this has not happened.  Patient tells me that she wants to hold off on getting the CT scan of the chest right now but will do it at the time of follow-up.  Last CT scan of the chest was in July 2020.  At this point in time she continues on Spiriva, Breo and Singulair.  She feels well.  She has been social distancing.  She has had her Covid vaccine but will have a booster next week.  She will have a flu shot today but at the Coumadin clinic.  There are no active complaints.  She is interested in consolidating her Spiriva and Breo and to Trelegy.  She denies any recent exacerbations.   CAT Score 07/14/2020 01/22/2020  Total CAT Score 6 9     No flowsheet data found.   No flowsheet data found.      OV 02/14/2022  Subjective:  Patient ID: Mattisyn Quin [name], female , DOB: Apr 06, 1946 , age 73 y.o. , MRN: [id number] , ADDRESS: [address] [place] [address] PCP [name], MD Patient Care Team: [name], MD as PCP - General (Family Medicine) Thurmon Fair, MD as PCP - Cardiology (Cardiology) [name], MD as Consulting Physician (Gastroenterology) Dorothy Puffer, MD as Consulting  Physician (Radiation Oncology) Laurice Record, MD as Consulting Physician (Hematology and Oncology) Susa Griffins, MD (Inactive) as Consulting Physician (Cardiology) [name], MD as Consulting Physician (Pulmonary Disease) [name], MD (General Surgery) Thurmon Fair, MD as Consulting Physician (Cardiology) Mckinley Jewel, MD as Consulting Physician (Ophthalmology) [place], Amy, MD as Consulting Physician (Dermatology) Drema Dallas, DO as Consulting Physician (Neurology)  This Provider for this visit: Treatment Team:  Attending Provider: [name], MD    02/14/2022 -   Chief Complaint  Patient presents with   Follow-up    Pt is here following up after recent CT. Pt states she has been doing okay since last visit.   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 Multiple lung nodules with waxing and waning quality  -last CT scanApril 2023 and Nov 2022  COVID-19 in February 2023  HPI [name] 77 y.o. -presents for follow-up to discuss her CT results.  Shortly after the CT scan was done she was alarmed with the results.  She is really worried  [name], female , DOB: Dec 01, 1945 , age 77 y.o. , MRN: [id number] , ADDRESS: [address] [place] [address] PCP [name], MD Patient Care Team: [name], MD as PCP - General (Family Medicine) Thurmon Fair, MD as PCP - Cardiology (Cardiology) [name], MD as Consulting Physician (Gastroenterology) Dorothy Puffer, MD as Consulting Physician (Radiation Oncology) Laurice Record, MD as Consulting Physician (Hematology and Oncology) Susa Griffins, MD (Inactive) as Consulting Physician (Cardiology) [name], MD as Consulting Physician (Pulmonary Disease) [name], MD (General Surgery) Thurmon Fair, MD as Consulting Physician (Cardiology) Mckinley Jewel, MD as Consulting Physician (Ophthalmology) [place], Amy, MD as Consulting Physician (Dermatology) Drema Dallas, DO as Consulting Physician (Neurology)  This Provider for this visit: Treatment Team:  Attending Provider: [name], MD    06/05/2023 -   Chief Complaint  Patient presents with   Follow-up    HRCT done 05/29/23.  She is doing well overall with her breathing and denies any  new co's today. Would like to discuss RSV, Covid and Flu vax timing for this year.     HPI Dyanara Mallori Measel 77 y.o. -returns for follow-up of above issues.  She says from a respiratory point she is stable.  Not much sputum not much cough not much shortness of breath.  However she is bothered by her frailty, chronic pain.  She uses a cane.  She is also guarded left shin ulcer that is not healing now.  It seems superficial.  There is also an emerging wound on the right shin.  She elevates her legs.  She says it is really hard to put her compression stockings.  She is going to talk to her primary care physician today.  I suggested that she talk to him about getting a vascular service referral.  Otherwise compliant with inhalers.  She did have a CT scan of the chest.  I personally visualized it and showed it to her.  According to radiology the nodules are somewhat worse.  Last bronchoscopy was 2022 and she had Pseudomonas.  She did have positive ANA strongly a year and a half ago.  But she was reassured by rheumatology then.  QuantiFERON gold was also negative a year and a half ago.     CT Chest data from date: 05/29/23  - personally visualized and independently interpreted : yes - my findings are: as below   IMPRESSION: 1. 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 2. 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 3. Aortic atherosclerosis, in addition to left main and three-vessel coronary artery disease. Assessment for potential risk factor modification, dietary therapy or pharmacologic therapy may be warranted, if clinically indicated. 4. Trace bilateral pleural effusions, unchanged. 5. Additional incidental findings, as above.   Aortic Atherosclerosis (ICD10-I70.0).     Electronically  Signed   By: Trudie Reed [name]   On: 06/01/2023 10:40   OV 08/01/2023  Subjective:  Patient ID: [name], female , DOB: 1946/07/27 , age 34 y.o. , MRN: [id number] , ADDRESS: [address] [place] [address] PCP [name], MD Patient Care Team: [name], MD as PCP - General (Family Medicine) Thurmon Fair, MD as PCP - Cardiology (Cardiology) [name], MD as Consulting Physician (Gastroenterology) Dorothy Puffer, MD as Consulting Physician (Radiation Oncology) Laurice Record, MD  5 3  Confident leaving home -> Not at all confident leaving home 0 0 3 1  Sleep soundly -> Do not sleep  soundly because of lung condition 4 4 3 3   Lots of Energy -> No energy at all 2 3 5 4   TOTAL Score (max 40)  14 13 31 16    CAT Score 01/22/2020  Total CAT Score 9      ROS - per HPI    OV 07/14/2020  Subjective:  Patient ID: Makynzie Quin [name], female , DOB: [birth year] , age 39 y.o. , MRN: [id number] , ADDRESS: [address] 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 -presents for follow-up of her COPD.  Last seen in early part of this year.  She is supposed to have a follow-up CT scan of the chest but this has not happened.  Patient tells me that she wants to hold off on getting the CT scan of the chest right now but will do it at the time of follow-up.  Last CT scan of the chest was in July 2020.  At this point in time she continues on Spiriva, Breo and Singulair.  She feels well.  She has been social distancing.  She has had her Covid vaccine but will have a booster next week.  She will have a flu shot today but at the Coumadin clinic.  There are no active complaints.  She is interested in consolidating her Spiriva and Breo and to Trelegy.  She denies any recent exacerbations.   CAT Score 07/14/2020 01/22/2020  Total CAT Score 6 9     No flowsheet data found.   No flowsheet data found.      OV 02/14/2022  Subjective:  Patient ID: Mattisyn Quin [name], female , DOB: Apr 06, 1946 , age 73 y.o. , MRN: [id number] , ADDRESS: [address] [place] [address] PCP [name], MD Patient Care Team: [name], MD as PCP - General (Family Medicine) Thurmon Fair, MD as PCP - Cardiology (Cardiology) [name], MD as Consulting Physician (Gastroenterology) Dorothy Puffer, MD as Consulting  Physician (Radiation Oncology) Laurice Record, MD as Consulting Physician (Hematology and Oncology) Susa Griffins, MD (Inactive) as Consulting Physician (Cardiology) [name], MD as Consulting Physician (Pulmonary Disease) [name], MD (General Surgery) Thurmon Fair, MD as Consulting Physician (Cardiology) Mckinley Jewel, MD as Consulting Physician (Ophthalmology) [place], Amy, MD as Consulting Physician (Dermatology) Drema Dallas, DO as Consulting Physician (Neurology)  This Provider for this visit: Treatment Team:  Attending Provider: [name], MD    02/14/2022 -   Chief Complaint  Patient presents with   Follow-up    Pt is here following up after recent CT. Pt states she has been doing okay since last visit.   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 Multiple lung nodules with waxing and waning quality  -last CT scanApril 2023 and Nov 2022  COVID-19 in February 2023  HPI [name] 77 y.o. -presents for follow-up to discuss her CT results.  Shortly after the CT scan was done she was alarmed with the results.  She is really worried  COMPARISON:  CT 01/20/2022   FINDINGS: Cardiovascular: Coronary artery calcification and aortic atherosclerotic calcification. Dense aortic and mitral valvular calcification. Valvular prosthetics. No pericardial fluid. Midline sternotomy   Mediastinum/Nodes: No axillary or supraclavicular adenopathy. No mediastinal or hilar adenopathy. No pericardial fluid. Esophagus normal.   Lungs/Pleura: Branching tree-in-bud pattern in the medial RIGHT middle lobe and medial lingula unchanged. Nodule in the RIGHT upper lobe anterior to the fissure measuring 7 mm (68/series 3) is new from prior. Interval resolution of previous seen nodule in the apical segment of the LEFT lower lobe.   New nodule in the RIGHT lower lobe measuring 5 mm image 95/3). Resolution of peripheral nodule in the RIGHT lower lobe seen on comparison exam measuring 7 mm that time   Upper Abdomen: Limited view of the liver, kidneys,  pancreas are unremarkable. Normal adrenal glands.   Musculoskeletal: No aggressive osseous lesion.   IMPRESSION: 1. Waxing waning bilateral pulmonary nodules suggest chronic pulmonary infection/atypical infection. 2. Branching nodular pattern within the medial upper lobes also consistent with atypical pulmonary infection. 3. Dense valvular calcifications with valvular prosthetics.     Electronically Signed   By: Genevive Bi [name]   On: 05/13/2022 15:59    OV 12/02/2022  Subjective:  Patient ID: [name], female , DOB: [birth year] , age 56 y.o. , MRN: [id number] , ADDRESS: [address] [place] [address] PCP [name], MD Patient Care Team: [name], MD as PCP - General (Family Medicine) Thurmon Fair, MD as PCP - Cardiology (Cardiology) [name], MD as Consulting Physician (Gastroenterology) Dorothy Puffer, MD as Consulting Physician (Radiation Oncology) Laurice Record, MD as Consulting Physician (Hematology and Oncology) Susa Griffins, MD (Inactive) as Consulting Physician (Cardiology) [name], MD as Consulting Physician (Pulmonary Disease) [name], MD (General Surgery) Thurmon Fair, MD as Consulting Physician (Cardiology) Mckinley Jewel, MD as Consulting Physician (Ophthalmology) [place], Amy, MD as Consulting Physician (Dermatology) Drema Dallas, DO as Consulting Physician (Neurology)  This Provider for this visit: Treatment Team:  Attending Provider: [name], MD    12/02/2022 -   Chief Complaint  Patient presents with   Follow-up    Pt states she has been doing pretty well   HPI [name] 77 y.o. -returns for follow-up.  She has COPD.  She continues to be stable with her COPD.  She is on albuterol as needed along with DuoNeb and Singulair and Trelegy.  Respiratory wise she is stable.  August 2023 she did have CT scan of the chest that shows the lung nodules continue to  have a waxing and waning quality.  She prefers to have repeat CT scan because her father died for lung cancer and she is worried about this.  We took a shared decision making to have the neck CT scan in August 2024 which would be a 1 year follow-up.  She is agreeable to this.  She had a repeat COVID-19 in January 2024.  Was mild.  She took antiviral molnupiravir.  The main issue for her is that she is having severe chronic back pain.  She says she is needs to be sitting down or take tramadol.  Previous Pilates does not help.  She has seen Dr. Danielle Dess for this.  Also review of the external records indicate that in December 2023 she had hospitalization for memory issues.  Apparently lab work was normal.  I did review Dr. Renaee Munda admission     OV 06/05/2023  Subjective:  Patient ID:  she had some chronic scarring at that area].  She was treated with 5-day Levaquin and a Medrol Dosepak but despite this other than subjective fevers are resolving she still has significant cough congestion and chest tightness without any orthopnea proximal nocturnal dyspnea.  COPD CAT score shows significant deterioration.  She is not feeling well.  Of note, she is intentionally losing weight because of weight watchers but she has not increased her fluid intake adequately.  She has quit all diet soda  Show slight increase in liver enzymes and also slight increase in creatinine compared to 6 months ago.   OV 03/13/2018  Chief Complaint  Patient presents with   Follow-up    Breathing is improved  since last OV. Still reports DOE and occasional cough. Denies chest tightness or wheezing.    [name]  presents for follow-up of her gold stage II /3 of COPD.  On triple inhaler therapy with Spiriva and Brio and also on Singulair   Overall in terms of her COPD: She is stable.  She is compliant with her inhalers.  She is not on oxygen therapy.  Her symptoms actually improved.  In the interim she was diagnosed with atrial fibrillation and she is placed on Xarelto.  She is frustrated because she has to be on 1 more extra medication.  She did have pulmonary function test that is documented below because it has been a while since we did one.  It actually shows improvement in the lung function.  She also had a CT scan of the chest because the last one was in 2015.  This shows scattered lung nodules with the largest one 8 mm in the right lower lobe.  The radiologist has described this is nonspecific in appearance.  She heard the result and she has been extremely anxious and upset.  She is worried that her colon cancer has metastasized or she has lung cancer.  Of note, I personally visualized the CT chest and agree with the findings   OV 06/27/2018  Subjective:  Patient ID: [name], female , DOB: [birth year] , age 77 y.o. , MRN: [id number] , ADDRESS: [address] [place] [address]   06/27/2018 -   Chief Complaint  Patient presents with   Follow-up    Pt states her breathing has been doing good since last visit. States she has been having some BP issues to the point she fainted and cracked some ribs.  Denies any problems with cough or CP.     HPI [name] 77 y.o. - follow-up for  - COPD: Overall this is stable with inhaler therapy. She has not had her flu shot. She wants to wait 2 weeks because she just had first shot of her shingles vaccine. She is up-to-date with her pneumonia vaccine. In the interim she has lost weight 60 pounds intentionally and this is improved on  Score  Multiple  Lung nodules: She had follow-up CT chest in September 2019 and compared to May 2019: One of the nodules is resolved to 16 mm and stable. She does have some rib fractures subacute and this is because she fell down. She's been having balance issues with the weight loss and orthostatic hypotension. Both of this is being managed by neurosurgery and primary care  Travel advice: She plans to have back surgery for her foot drop after this she plans to recover at her son's home near [place]. Therefore she wants a antibiotic and prednisone taper to be  5 3  Confident leaving home -> Not at all confident leaving home 0 0 3 1  Sleep soundly -> Do not sleep  soundly because of lung condition 4 4 3 3   Lots of Energy -> No energy at all 2 3 5 4   TOTAL Score (max 40)  14 13 31 16    CAT Score 01/22/2020  Total CAT Score 9      ROS - per HPI    OV 07/14/2020  Subjective:  Patient ID: Makynzie Quin [name], female , DOB: [birth year] , age 39 y.o. , MRN: [id number] , ADDRESS: [address] 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 -presents for follow-up of her COPD.  Last seen in early part of this year.  She is supposed to have a follow-up CT scan of the chest but this has not happened.  Patient tells me that she wants to hold off on getting the CT scan of the chest right now but will do it at the time of follow-up.  Last CT scan of the chest was in July 2020.  At this point in time she continues on Spiriva, Breo and Singulair.  She feels well.  She has been social distancing.  She has had her Covid vaccine but will have a booster next week.  She will have a flu shot today but at the Coumadin clinic.  There are no active complaints.  She is interested in consolidating her Spiriva and Breo and to Trelegy.  She denies any recent exacerbations.   CAT Score 07/14/2020 01/22/2020  Total CAT Score 6 9     No flowsheet data found.   No flowsheet data found.      OV 02/14/2022  Subjective:  Patient ID: Mattisyn Quin [name], female , DOB: Apr 06, 1946 , age 73 y.o. , MRN: [id number] , ADDRESS: [address] [place] [address] PCP [name], MD Patient Care Team: [name], MD as PCP - General (Family Medicine) Thurmon Fair, MD as PCP - Cardiology (Cardiology) [name], MD as Consulting Physician (Gastroenterology) Dorothy Puffer, MD as Consulting  Physician (Radiation Oncology) Laurice Record, MD as Consulting Physician (Hematology and Oncology) Susa Griffins, MD (Inactive) as Consulting Physician (Cardiology) [name], MD as Consulting Physician (Pulmonary Disease) [name], MD (General Surgery) Thurmon Fair, MD as Consulting Physician (Cardiology) Mckinley Jewel, MD as Consulting Physician (Ophthalmology) [place], Amy, MD as Consulting Physician (Dermatology) Drema Dallas, DO as Consulting Physician (Neurology)  This Provider for this visit: Treatment Team:  Attending Provider: [name], MD    02/14/2022 -   Chief Complaint  Patient presents with   Follow-up    Pt is here following up after recent CT. Pt states she has been doing okay since last visit.   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 Multiple lung nodules with waxing and waning quality  -last CT scanApril 2023 and Nov 2022  COVID-19 in February 2023  HPI [name] 77 y.o. -presents for follow-up to discuss her CT results.  Shortly after the CT scan was done she was alarmed with the results.  She is really worried  Upper Abdomen: Abdominal aortic and systemic atherosclerotic calcification. Clips along the gallbladder fossa suggest prior cholecystectomy.   Musculoskeletal: Right rib deformities from old healed rib fractures. Thoracic spondylosis with multilevel bridging spurring. Chronic anterior wedge compression fracture at L1 especially along the inferior endplate.   IMPRESSION: 1. Generally worsened scattered nodularity and bandlike opacities in the lungs superimposed on a background of scattered tree-in-bud reticulonodular opacities. 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 In this setting of scattered nodularity, high specificity with regard to malignant versus infectious etiology of any individual nodule is not feasible on noncontrast CT. Surveillance is suggested. 2. Marked atherosclerosis. 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 Mild cardiomegaly. 3. Ascending thoracic aortic aneurysm 4.2 cm in diameter,  stable. Recommend annual imaging followup by CTA or MRA. This recommendation follows 2010 ACCF/AHA/AATS/ACR/ASA/SCA/SCAI/SIR/STS/SVM Guidelines for the Diagnosis and Management of Patients with Thoracic Aortic Disease. Circulation. 2010; 121: I347-Q259. Aortic aneurysm NOS (ICD10-I71.9) 4. Stable trace right pleural effusion. 5. Prominent main pulmonary artery compatible pulmonary arterial hypertension. 6. Cylindrical bronchiectasis in the right lower lobe.     Electronically Signed   By: Gaylyn Rong [name]   On: 01/21/2022 08:41        OV 05/24/2022  Subjective:  Patient ID: [name], female , DOB: 1946/07/08 , age 52 y.o. , MRN: [id number] , ADDRESS: [address] [place] [address] PCP [name], MD Patient Care Team: [name], MD as PCP - General (Family Medicine) Thurmon Fair, MD as PCP - Cardiology (Cardiology) [name], MD as Consulting Physician (Gastroenterology) Dorothy Puffer, MD as Consulting Physician (Radiation Oncology) Laurice Record, MD as Consulting Physician (Hematology and Oncology) Susa Griffins, MD (Inactive) as Consulting Physician (Cardiology) [name], MD as Consulting Physician (Pulmonary Disease) [name], MD (General Surgery) Thurmon Fair, MD as Consulting Physician (Cardiology) Mckinley Jewel, MD as Consulting Physician (Ophthalmology) [place], Amy, MD as Consulting Physician (Dermatology) Drema Dallas, DO as Consulting Physician (Neurology)  This Provider for this visit: Treatment Team:  Attending Provider: [name], MD    05/24/2022 -   Chief Complaint  Patient presents with   Follow-up    Pt is here today to discuss results of recent CT.     HPI [name] 77 y.o. -waxing and waning pulmonary nodules she had another CT scan that shows the same.  Currently feels relatively stable.  Respiratory status is stable for her COPD.  She did see Dr.  Dorris Fetch May 2023 and he felt biopsy would be nondiagnostic and recommended serial expectant approach.  She did have a most recent CT scan as documented below and it shows continued waxing and waning nodules.  Incidentally she was found to have positive ANA when I tested her for etiology of the nodules.  With strongly positive at 1: 1280.  She has upcoming appointment Dr. Sheliah Hatch in rheumatology.  I have asked her to keep it.  Indicated to her the nodules could be related to the positive ANA but the positive ANA could also be post-COVID.  CLINICAL DATA:  Multiple lung nodules.   EXAM: CT CHEST WITHOUT CONTRAST   TECHNIQUE: Multidetector CT imaging of the chest was performed following the standard protocol without IV contrast.   RADIATION DOSE REDUCTION: This exam was performed according to the departmental dose-optimization program which includes automated exposure control, adjustment of the mA and/or kV according to patient size and/or use of iterative reconstruction technique.

## 2023-08-01 NOTE — Patient Instructions (Addendum)
ICD-10-CM   1. Moderate COPD (chronic obstructive pulmonary disease) (HCC)  J44.9     2. Multiple lung nodules on CT  R91.8     3. ANA positive  R76.8     4. Frailty  R54     5. 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

## 2023-08-02 ENCOUNTER — Encounter: Payer: Medicare Other | Admitting: Internal Medicine

## 2023-08-02 ENCOUNTER — Other Ambulatory Visit: Payer: Self-pay | Admitting: Family Medicine

## 2023-08-02 ENCOUNTER — Ambulatory Visit: Payer: Medicare Other | Admitting: Physical Therapy

## 2023-08-02 DIAGNOSIS — D539 Nutritional anemia, unspecified: Secondary | ICD-10-CM

## 2023-08-02 DIAGNOSIS — D72810 Lymphocytopenia: Secondary | ICD-10-CM | POA: Insufficient documentation

## 2023-08-02 DIAGNOSIS — D696 Thrombocytopenia, unspecified: Secondary | ICD-10-CM

## 2023-08-02 DIAGNOSIS — I87333 Chronic venous hypertension (idiopathic) with ulcer and inflammation of bilateral lower extremity: Secondary | ICD-10-CM | POA: Diagnosis not present

## 2023-08-03 NOTE — Telephone Encounter (Signed)
PT calling wanting to be sure Dr. Camie Patience her [facility] message. Her # is [phone]

## 2023-08-03 NOTE — Progress Notes (Addendum)
S ([id number]) [id number] Page 7 of 7 Patient Name: Date of Service: [name], [name] 08/02/2023 Medical Record Number: [id number] Patient Account Number: [id number] Date of Birth/Sex: Treating RN: 10-31-1945 (77 y.o. [name] Primary Care Provider: [name] Other Clinician: [name] Referring Provider: Treating Provider/Extender: Chauncey Mann, MICHA EL Burt Knack Weeks in Treatment: 1 Diagnosis Coding ICD-10 Codes Code Description [phone] Chronic venous hypertension (idiopathic) with ulcer and inflammation of bilateral lower extremity L97.821 Non-pressure chronic ulcer of other part of left lower leg limited to breakdown of skin L97.818 Non-pressure chronic ulcer of other part of right lower leg with other specified severity L97.328 Non-pressure chronic ulcer of left ankle with other specified severity S40.811D Abrasion of right upper arm, subsequent encounter Facility Procedures CPT4 Description Modifier Quantity Code [phone] [id number] BILATERAL: Application of multi-layer venous compression system; leg (below knee), including ankle and 1 foot. Physician Procedures Quantity CPT4 Code Description Modifier [phone] [id number] - WC PHYS LEVEL 3 - EST PT 1 ICD-10 Diagnosis Description I87.333 Chronic venous hypertension (idiopathic) with ulcer and inflammation of bilateral lower extremity L97.821 Non-pressure chronic ulcer of other part of left lower leg limited to breakdown of skin L97.818 Non-pressure chronic ulcer of other part of right lower leg with other specified severity L97.328 Non-pressure chronic ulcer of  left ankle with other specified severity Electronic Signature(s) Signed: 08/03/2023 3:33:32 PM By: Baltazar Najjar MD Entered By: Baltazar Najjar on 08/02/2023 08:45:03  [name], [name] ([id number]) [id number] Page 1 of 7 Visit Report for 08/02/2023 HPI Details Patient Name: Date of Service: [name], [name] 08/02/2023 10:00 A M Medical Record Number: [id number] Patient Account Number: [id number] Date of Birth/Sex: Treating RN: Oct 26, 1945 (77 y.o. [name] Primary Care Provider: [name] Other Clinician: [name] Referring Provider: Treating Provider/Extender: Chauncey Mann, MICHA EL Burt Knack Weeks in Treatment: 1 History of Present Illness HPI Description: 07/26/2023 ADMISSION This is a 77 year old woman who has been referred for wounds on her bilateral lower legs by her primary physician. She was last seen in primary care on 06/15/2023 at which time she had an abrasion injury to the left anterior shin right medial leg and some scabbing over the left lateral malleolus. She also had bilateral heel issues although they were not open today. A culture of 1 of these wounds showed Pseudomonas I am not exactly sure what antibiotic she received. She had arterial studies done on 07/13/2023 that showed an ABI on the right of 1.25 TBI 1.08 with biphasic and triphasic waveforms. On the left ABI I was 1.29 TBI at 0.92 and triphasic waveforms. All of this suggests that her arterial flow was quite good. The patient states that she has on and off wounds in her bilateral lower extremities for at least a year. The areas will heal and then reopen. She has been prescribed compression stockings in the past but she cannot get them on. She lives independently. A week ago she had a fall at home causing a skin tear on the right dorsal arm. 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 Her wounds are a lot better with simple compression of an  Urgo K2 light. I think she is going to require ongoing compression stockings. She reminds me that she cannot get standard over the toe stockings on. We will look at external compression stockings The area on the right anterior arm we use Xeroform to last week. This apparently stuck to the wound she seemed particularly cross about this. She wants to continue to use triamcinolone and T on it. I did not argue this point elfa Electronic Signature(s) Signed: 08/03/2023 3:33:32 PM By: Baltazar Najjar MD Entered By: Baltazar Najjar on 08/02/2023 08:42:32 -------------------------------------------------------------------------------- Physical Exam Details Patient Name: Date of Service: [name] 08/02/2023 10:00 A M Medical Record Number: [id number] Patient Account Number: [id number] Date of Birth/Sex: Treating RN: June 09, 1946 (77 y.o. [name] Primary Care Provider: [name] Other Clinician: [name] Referring Provider: Treating Provider/Extender: Chauncey Mann, MICHA EL Mickie Kay in Treatment: 1 [name], [name] ([id number]) [id number] Page 2 of 7 Constitutional Sitting or standing Blood Pressure is within target range for patient.. Pulse regular and within target range for patient.Marland Kitchen Respirations regular, non-labored and within target range.. Temperature is normal and within the target range for the patient.Marland Kitchen appears in no distress. Notes Wound exam; small wounds on the left medial lower leg and the right medial lower leg. Although the wounds are considerably better including the area on the left lateral malleolus. We have good edema control The area on the right forearm which was a skin tear is dry but no evidence of infection. Electronic Signature(s) Signed: 08/03/2023 3:33:32 PM By: Baltazar Najjar MD Entered By: Baltazar Najjar on 08/02/2023  08:43:35 -------------------------------------------------------------------------------- Physician Orders Details Patient Name: Date of Service:  S ([id number]) [id number] Page 7 of 7 Patient Name: Date of Service: [name], [name] 08/02/2023 Medical Record Number: [id number] Patient Account Number: [id number] Date of Birth/Sex: Treating RN: 10-31-1945 (77 y.o. [name] Primary Care Provider: [name] Other Clinician: [name] Referring Provider: Treating Provider/Extender: Chauncey Mann, MICHA EL Burt Knack Weeks in Treatment: 1 Diagnosis Coding ICD-10 Codes Code Description [phone] Chronic venous hypertension (idiopathic) with ulcer and inflammation of bilateral lower extremity L97.821 Non-pressure chronic ulcer of other part of left lower leg limited to breakdown of skin L97.818 Non-pressure chronic ulcer of other part of right lower leg with other specified severity L97.328 Non-pressure chronic ulcer of left ankle with other specified severity S40.811D Abrasion of right upper arm, subsequent encounter Facility Procedures CPT4 Description Modifier Quantity Code [phone] [id number] BILATERAL: Application of multi-layer venous compression system; leg (below knee), including ankle and 1 foot. Physician Procedures Quantity CPT4 Code Description Modifier [phone] 99213 - WC PHYS LEVEL 3 - EST PT 1 ICD-10 Diagnosis Description I87.333 Chronic venous hypertension (idiopathic) with ulcer and inflammation of bilateral lower extremity L97.821 Non-pressure chronic ulcer of other part of left lower leg limited to breakdown of skin L97.818 Non-pressure chronic ulcer of other part of right lower leg with other specified severity L97.328 Non-pressure chronic ulcer of  left ankle with other specified severity Electronic Signature(s) Signed: 08/03/2023 3:33:32 PM By: Baltazar Najjar MD Entered By: Baltazar Najjar on 08/02/2023 08:45:03  S ([id number]) [id number] Page 7 of 7 Patient Name: Date of Service: [name], [name] 08/02/2023 Medical Record Number: [id number] Patient Account Number: [id number] Date of Birth/Sex: Treating RN: 10-31-1945 (77 y.o. [name] Primary Care Provider: [name] Other Clinician: [name] Referring Provider: Treating Provider/Extender: Chauncey Mann, MICHA EL Burt Knack Weeks in Treatment: 1 Diagnosis Coding ICD-10 Codes Code Description [phone] Chronic venous hypertension (idiopathic) with ulcer and inflammation of bilateral lower extremity L97.821 Non-pressure chronic ulcer of other part of left lower leg limited to breakdown of skin L97.818 Non-pressure chronic ulcer of other part of right lower leg with other specified severity L97.328 Non-pressure chronic ulcer of left ankle with other specified severity S40.811D Abrasion of right upper arm, subsequent encounter Facility Procedures CPT4 Description Modifier Quantity Code [phone] [id number] BILATERAL: Application of multi-layer venous compression system; leg (below knee), including ankle and 1 foot. Physician Procedures Quantity CPT4 Code Description Modifier [phone] [id number] - WC PHYS LEVEL 3 - EST PT 1 ICD-10 Diagnosis Description I87.333 Chronic venous hypertension (idiopathic) with ulcer and inflammation of bilateral lower extremity L97.821 Non-pressure chronic ulcer of other part of left lower leg limited to breakdown of skin L97.818 Non-pressure chronic ulcer of other part of right lower leg with other specified severity L97.328 Non-pressure chronic ulcer of  left ankle with other specified severity Electronic Signature(s) Signed: 08/03/2023 3:33:32 PM By: Baltazar Najjar MD Entered By: Baltazar Najjar on 08/02/2023 08:45:03  [name], [name] ([id number]) [id number] Page 1 of 7 Visit Report for 08/02/2023 HPI Details Patient Name: Date of Service: [name], [name] 08/02/2023 10:00 A M Medical Record Number: [id number] Patient Account Number: [id number] Date of Birth/Sex: Treating RN: Oct 26, 1945 (77 y.o. [name] Primary Care Provider: [name] Other Clinician: [name] Referring Provider: Treating Provider/Extender: Chauncey Mann, MICHA EL Burt Knack Weeks in Treatment: 1 History of Present Illness HPI Description: 07/26/2023 ADMISSION This is a 77 year old woman who has been referred for wounds on her bilateral lower legs by her primary physician. She was last seen in primary care on 06/15/2023 at which time she had an abrasion injury to the left anterior shin right medial leg and some scabbing over the left lateral malleolus. She also had bilateral heel issues although they were not open today. A culture of 1 of these wounds showed Pseudomonas I am not exactly sure what antibiotic she received. She had arterial studies done on 07/13/2023 that showed an ABI on the right of 1.25 TBI 1.08 with biphasic and triphasic waveforms. On the left ABI I was 1.29 TBI at 0.92 and triphasic waveforms. All of this suggests that her arterial flow was quite good. The patient states that she has on and off wounds in her bilateral lower extremities for at least a year. The areas will heal and then reopen. She has been prescribed compression stockings in the past but she cannot get them on. She lives independently. A week ago she had a fall at home causing a skin tear on the right dorsal arm. 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 Her wounds are a lot better with simple compression of an  Urgo K2 light. I think she is going to require ongoing compression stockings. She reminds me that she cannot get standard over the toe stockings on. We will look at external compression stockings The area on the right anterior arm we use Xeroform to last week. This apparently stuck to the wound she seemed particularly cross about this. She wants to continue to use triamcinolone and T on it. I did not argue this point elfa Electronic Signature(s) Signed: 08/03/2023 3:33:32 PM By: Baltazar Najjar MD Entered By: Baltazar Najjar on 08/02/2023 08:42:32 -------------------------------------------------------------------------------- Physical Exam Details Patient Name: Date of Service: [name] 08/02/2023 10:00 A M Medical Record Number: [id number] Patient Account Number: [id number] Date of Birth/Sex: Treating RN: June 09, 1946 (77 y.o. [name] Primary Care Provider: [name] Other Clinician: [name] Referring Provider: Treating Provider/Extender: Chauncey Mann, MICHA EL Mickie Kay in Treatment: 1 [name], [name] ([id number]) [id number] Page 2 of 7 Constitutional Sitting or standing Blood Pressure is within target range for patient.. Pulse regular and within target range for patient.Marland Kitchen Respirations regular, non-labored and within target range.. Temperature is normal and within the target range for the patient.Marland Kitchen appears in no distress. Notes Wound exam; small wounds on the left medial lower leg and the right medial lower leg. Although the wounds are considerably better including the area on the left lateral malleolus. We have good edema control The area on the right forearm which was a skin tear is dry but no evidence of infection. Electronic Signature(s) Signed: 08/03/2023 3:33:32 PM By: Baltazar Najjar MD Entered By: Baltazar Najjar on 08/02/2023  08:43:35 -------------------------------------------------------------------------------- Physician Orders Details Patient Name: Date of Service:  [name], [name] ([id number]) [id number] Page 1 of 7 Visit Report for 08/02/2023 HPI Details Patient Name: Date of Service: [name], [name] 08/02/2023 10:00 A M Medical Record Number: [id number] Patient Account Number: [id number] Date of Birth/Sex: Treating RN: Oct 26, 1945 (77 y.o. [name] Primary Care Provider: [name] Other Clinician: [name] Referring Provider: Treating Provider/Extender: Chauncey Mann, MICHA EL Burt Knack Weeks in Treatment: 1 History of Present Illness HPI Description: 07/26/2023 ADMISSION This is a 77 year old woman who has been referred for wounds on her bilateral lower legs by her primary physician. She was last seen in primary care on 06/15/2023 at which time she had an abrasion injury to the left anterior shin right medial leg and some scabbing over the left lateral malleolus. She also had bilateral heel issues although they were not open today. A culture of 1 of these wounds showed Pseudomonas I am not exactly sure what antibiotic she received. She had arterial studies done on 07/13/2023 that showed an ABI on the right of 1.25 TBI 1.08 with biphasic and triphasic waveforms. On the left ABI I was 1.29 TBI at 0.92 and triphasic waveforms. All of this suggests that her arterial flow was quite good. The patient states that she has on and off wounds in her bilateral lower extremities for at least a year. The areas will heal and then reopen. She has been prescribed compression stockings in the past but she cannot get them on. She lives independently. A week ago she had a fall at home causing a skin tear on the right dorsal arm. 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 Her wounds are a lot better with simple compression of an  Urgo K2 light. I think she is going to require ongoing compression stockings. She reminds me that she cannot get standard over the toe stockings on. We will look at external compression stockings The area on the right anterior arm we use Xeroform to last week. This apparently stuck to the wound she seemed particularly cross about this. She wants to continue to use triamcinolone and T on it. I did not argue this point elfa Electronic Signature(s) Signed: 08/03/2023 3:33:32 PM By: Baltazar Najjar MD Entered By: Baltazar Najjar on 08/02/2023 08:42:32 -------------------------------------------------------------------------------- Physical Exam Details Patient Name: Date of Service: [name] 08/02/2023 10:00 A M Medical Record Number: [id number] Patient Account Number: [id number] Date of Birth/Sex: Treating RN: June 09, 1946 (77 y.o. [name] Primary Care Provider: [name] Other Clinician: [name] Referring Provider: Treating Provider/Extender: Chauncey Mann, MICHA EL Mickie Kay in Treatment: 1 [name], [name] ([id number]) [id number] Page 2 of 7 Constitutional Sitting or standing Blood Pressure is within target range for patient.. Pulse regular and within target range for patient.Marland Kitchen Respirations regular, non-labored and within target range.. Temperature is normal and within the target range for the patient.Marland Kitchen appears in no distress. Notes Wound exam; small wounds on the left medial lower leg and the right medial lower leg. Although the wounds are considerably better including the area on the left lateral malleolus. We have good edema control The area on the right forearm which was a skin tear is dry but no evidence of infection. Electronic Signature(s) Signed: 08/03/2023 3:33:32 PM By: Baltazar Najjar MD Entered By: Baltazar Najjar on 08/02/2023  08:43:35 -------------------------------------------------------------------------------- Physician Orders Details Patient Name: Date of Service:

## 2023-08-07 ENCOUNTER — Ambulatory Visit: Payer: Medicare Other | Admitting: Physical Therapy

## 2023-08-07 ENCOUNTER — Telehealth: Payer: Self-pay | Admitting: *Deleted

## 2023-08-07 ENCOUNTER — Other Ambulatory Visit: Payer: Self-pay | Admitting: *Deleted

## 2023-08-07 ENCOUNTER — Encounter: Payer: Self-pay | Admitting: Internal Medicine

## 2023-08-07 DIAGNOSIS — R269 Unspecified abnormalities of gait and mobility: Secondary | ICD-10-CM

## 2023-08-07 DIAGNOSIS — M6281 Muscle weakness (generalized): Secondary | ICD-10-CM

## 2023-08-07 DIAGNOSIS — R262 Difficulty in walking, not elsewhere classified: Secondary | ICD-10-CM

## 2023-08-07 DIAGNOSIS — D539 Nutritional anemia, unspecified: Secondary | ICD-10-CM

## 2023-08-07 DIAGNOSIS — R2689 Other abnormalities of gait and mobility: Secondary | ICD-10-CM

## 2023-08-07 NOTE — Telephone Encounter (Signed)
Called in regard to referral received

## 2023-08-07 NOTE — Progress Notes (Signed)
Medium (34-66%) Exposed Structure Granulation Quality: Red Fascia Exposed: No Fat Layer (Subcutaneous Tissue) Exposed: Yes Tendon Exposed: No Muscle Exposed: No Joint  Exposed: No Bone Exposed: No Treatment Notes Wound #4 (Forearm) Wound Laterality: Right, Anterior Cleanser Byram Ancillary Kit - 15 Day Supply Discharge Instruction: Use supplies as instructed; Kit contains: (15) Saline Bullets; (15) 3x3 Gauze; 15 pr Gloves Soap and Water Discharge Instruction: Gently cleanse wound with antibacterial soap, rinse and pat dry prior to dressing wounds Peri-Wound Care Topical Triamcinolone Acetonide Cream, 0.1%, 15 (g) tube Discharge Instruction: Apply as directed by [name]. Primary Dressing Secondary Dressing telfa nonstick pad Secured With Conform 4'' - Conforming Stretch Gauze Bandage 4x75 (in/in) Discharge Instruction: Apply as directed [name], [name] ([id number]) [id number] Page 15 of 15 Stretch Net, Size 4, 10 (yds) Compression Wrap Compression Stockings Add-Ons Electronic Signature(s) Signed: 08/02/2023 5:46:43 PM By: [name] Signed: 08/07/2023 8:00:25 AM By: Yevonne Pax RN Entered By: [name] on 08/02/2023 07:46:32 -------------------------------------------------------------------------------- Vitals Details Patient Name: Date of Service: [name]. 08/02/2023 10:00 A M Medical Record Number: [id number] Patient Account Number: [id number] Date of Birth/Sex: Treating RN: [birth year] (77 y.o. [name] Primary Care [name]: [name] [name]: [name] Referring [name]: Treating [name]/Extender: [name], [name] Weeks in Treatment: 1 Vital Signs Time Taken: 10:30 Temperature (F): 98.0 Height (in): 63 Pulse (bpm): 91 Weight (lbs): 141.8 Respiratory Rate (breaths/min): 18 Body Mass Index (BMI): 25.1 Blood Pressure (mmHg): 138/87 Reference Range: 80 - 120 mg / dl Electronic Signature(s) Signed: 08/02/2023 5:46:43 PM By: [name] Entered By: [name] on 08/02/2023 07:32:07  Suture removal (26 or more) []  - 0 Hypo/Hyperglycemic Management (do not check if billed separately) []  - 0 Ankle / Brachial Index (ABI) - do not check if billed separately Has the patient been seen at the hospital within the last three years: Yes Total Score: 0 Level Of Care: ____ Electronic Signature(s) Signed: 08/02/2023 5:46:43 PM By: [name] Entered By: [name] on 08/02/2023 08:23:44 -------------------------------------------------------------------------------- Compression Therapy Details Patient Name: Date of Service: [name] 08/02/2023 10:00 A M Medical Record Number: [id number] Patient Account Number: [id number] Date of Birth/Sex: Treating RN: 1946/01/04 (77 y.o. [name] Primary Care Rosalie Gelpi: [name] [name]: Kymoni, [name] ([id number]) [id number] Page 3 of 15 Referring [name]: Treating Liya Strollo/Extender: [name], [name] Weeks in Treatment: 1 Compression Therapy Performed for Wound Assessment: Wound #1 Right,Medial Lower Leg Performed By: Farrel Gordon, Angie, Compression Type: Double Layer Post Procedure Diagnosis Same as Pre-procedure Electronic Signature(s) Signed: 08/02/2023 5:46:43 PM By: [name] Entered By: [name] on 08/02/2023 08:23:11 -------------------------------------------------------------------------------- Compression Therapy Details Patient Name: Date of Service: [name] 08/02/2023 10:00 A M Medical Record Number: [id number] Patient Account Number: [id number] Date of Birth/Sex: Treating RN: [birth year] (77 y.o. [name] Primary Care Latrail Pounders: [name] [name]: [name] Referring Jhordan Kinter: Treating Regis Wiland/Extender:  Chauncey [name], [name] Weeks in Treatment: 1 Compression Therapy Performed for Wound Assessment: Wound #2 Left,Medial Lower Leg Performed By: Farrel Gordon, Angie, Compression Type: Double Layer Post Procedure Diagnosis Same as Pre-procedure Electronic Signature(s) Signed: 08/02/2023 5:46:43 PM By: [name] Entered By: [name] on 08/02/2023 08:23:25 -------------------------------------------------------------------------------- Encounter Discharge Information Details Patient Name: Date of Service: [name] 08/02/2023 10:00 A M Medical Record Number: [id number] Patient Account Number: [id number] Date of Birth/Sex: Treating RN: July 28, 1946 (77 y.o. [name] Primary Care Yarely Bebee: [name] [name]: [name] Referring Temesgen Weightman: Treating Vasily Fedewa/Extender: Diane BSO Dorris Carnes, Diane EL Mickie Kay in Treatment: 1 Encounter Discharge Information Items Discharge Condition: Stable Ambulatory Status: [facility] Discharge Destination: Home [name], [name] ([id number]) [id number] Page 4 of 15 Transportation: Diane Accompanied By: self Schedule Follow-up Appointment: Yes Clinical Summary of Care: Electronic Signature(s) Signed: 08/02/2023 5:46:43 PM By: [name] Entered By: [name] on 08/02/2023 14:41:37 -------------------------------------------------------------------------------- Lower Extremity Assessment Details Patient Name: Date of Service: [name], [name] 08/02/2023 10:00 A M Medical Record Number: [id number] Patient Account Number: [id number] Date of Birth/Sex: Treating RN: 12/30/1945 (77 y.o. [name] Primary Care Tristian Sickinger: [name] [name]: [name] Referring Tolbert Matheson: Treating Lorene Samaan/Extender: Chauncey [name], [name] Weeks in Treatment: 1 Edema Assessment Assessed: [Left: Yes] [Right: Yes] Edema: [Left: No]  [Right: No] Calf Left: Right: Point of Measurement: 34 cm From Medial Instep 28.7 cm 29 cm Ankle Left: Right: Point of Measurement: 12 cm From Medial Instep 19 cm 19 cm Knee To Floor Left: Right: From Medial Instep 35 cm 35 cm Vascular Assessment Pulses: Dorsalis Pedis Palpable: [Left:Yes] [Right:Yes] Extremity colors, hair growth, and conditions: Extremity Color: [Left:Normal] [Right:Normal] Hair Growth on Extremity: [Left:No] [Right:No] Temperature of Extremity: [Left:Cool < 3 seconds] [Right:Cool < 3 seconds] Toe Nail Assessment Left: Right: Thick: No No Discolored: No No Deformed: No No Improper Length and Hygiene: No No Electronic Signature(s) Signed: 08/02/2023 5:46:43 PM By: [name] Signed: 08/07/2023 8:00:25 AM By: Yevonne Pax RN Entered By: [name] on 08/02/2023 07:48:22 Robin Searing ([id number]) [id number] Page 5  Suture removal (26 or more) []  - 0 Hypo/Hyperglycemic Management (do not check if billed separately) []  - 0 Ankle / Brachial Index (ABI) - do not check if billed separately Has the patient been seen at the hospital within the last three years: Yes Total Score: 0 Level Of Care: ____ Electronic Signature(s) Signed: 08/02/2023 5:46:43 PM By: [name] Entered By: [name] on 08/02/2023 08:23:44 -------------------------------------------------------------------------------- Compression Therapy Details Patient Name: Date of Service: [name] 08/02/2023 10:00 A M Medical Record Number: [id number] Patient Account Number: [id number] Date of Birth/Sex: Treating RN: 1946/01/04 (77 y.o. [name] Primary Care Rosalie Gelpi: [name] [name]: Kymoni, [name] ([id number]) [id number] Page 3 of 15 Referring [name]: Treating Liya Strollo/Extender: [name], [name] Weeks in Treatment: 1 Compression Therapy Performed for Wound Assessment: Wound #1 Right,Medial Lower Leg Performed By: Farrel Gordon, Angie, Compression Type: Double Layer Post Procedure Diagnosis Same as Pre-procedure Electronic Signature(s) Signed: 08/02/2023 5:46:43 PM By: [name] Entered By: [name] on 08/02/2023 08:23:11 -------------------------------------------------------------------------------- Compression Therapy Details Patient Name: Date of Service: [name] 08/02/2023 10:00 A M Medical Record Number: [id number] Patient Account Number: [id number] Date of Birth/Sex: Treating RN: [birth year] (77 y.o. [name] Primary Care Latrail Pounders: [name] [name]: [name] Referring Jhordan Kinter: Treating Regis Wiland/Extender:  Chauncey [name], [name] Weeks in Treatment: 1 Compression Therapy Performed for Wound Assessment: Wound #2 Left,Medial Lower Leg Performed By: Farrel Gordon, Angie, Compression Type: Double Layer Post Procedure Diagnosis Same as Pre-procedure Electronic Signature(s) Signed: 08/02/2023 5:46:43 PM By: [name] Entered By: [name] on 08/02/2023 08:23:25 -------------------------------------------------------------------------------- Encounter Discharge Information Details Patient Name: Date of Service: [name] 08/02/2023 10:00 A M Medical Record Number: [id number] Patient Account Number: [id number] Date of Birth/Sex: Treating RN: July 28, 1946 (77 y.o. [name] Primary Care Yarely Bebee: [name] [name]: [name] Referring Temesgen Weightman: Treating Vasily Fedewa/Extender: Diane BSO Dorris Carnes, Diane EL Mickie Kay in Treatment: 1 Encounter Discharge Information Items Discharge Condition: Stable Ambulatory Status: [facility] Discharge Destination: Home [name], [name] ([id number]) [id number] Page 4 of 15 Transportation: Diane Accompanied By: self Schedule Follow-up Appointment: Yes Clinical Summary of Care: Electronic Signature(s) Signed: 08/02/2023 5:46:43 PM By: [name] Entered By: [name] on 08/02/2023 14:41:37 -------------------------------------------------------------------------------- Lower Extremity Assessment Details Patient Name: Date of Service: [name], [name] 08/02/2023 10:00 A M Medical Record Number: [id number] Patient Account Number: [id number] Date of Birth/Sex: Treating RN: 12/30/1945 (77 y.o. [name] Primary Care Tristian Sickinger: [name] [name]: [name] Referring Tolbert Matheson: Treating Lorene Samaan/Extender: Chauncey [name], [name] Weeks in Treatment: 1 Edema Assessment Assessed: [Left: Yes] [Right: Yes] Edema: [Left: No]  [Right: No] Calf Left: Right: Point of Measurement: 34 cm From Medial Instep 28.7 cm 29 cm Ankle Left: Right: Point of Measurement: 12 cm From Medial Instep 19 cm 19 cm Knee To Floor Left: Right: From Medial Instep 35 cm 35 cm Vascular Assessment Pulses: Dorsalis Pedis Palpable: [Left:Yes] [Right:Yes] Extremity colors, hair growth, and conditions: Extremity Color: [Left:Normal] [Right:Normal] Hair Growth on Extremity: [Left:No] [Right:No] Temperature of Extremity: [Left:Cool < 3 seconds] [Right:Cool < 3 seconds] Toe Nail Assessment Left: Right: Thick: No No Discolored: No No Deformed: No No Improper Length and Hygiene: No No Electronic Signature(s) Signed: 08/02/2023 5:46:43 PM By: [name] Signed: 08/07/2023 8:00:25 AM By: Yevonne Pax RN Entered By: [name] on 08/02/2023 07:48:22 Robin Searing ([id number]) [id number] Page 5  1 Education Assessment Education Provided To: Patient Education Topics Provided Wound/Skin Impairment: Handouts: Diane: continue wound care as directed Methods: Explain/Verbal Responses: State content correctly Electronic Signature(s) Signed: 08/02/2023 5:46:43 PM By: [name] Entered By: [name] on 08/02/2023 14:40:21 -------------------------------------------------------------------------------- Wound Assessment Details Patient Name: Date of Service: [name], [name] 08/02/2023 10:00 A M Medical Record Number: [id number] Patient Account Number: [id number] Date of Birth/Sex: Treating RN: Nov 03, 1945 (77 y.o. [name] Primary Care Shia Delaine: [name] [name]: [name] Referring Mychal Durio: Treating Koron Godeaux/Extender: Chauncey [name], [name] Weeks in Treatment: 1 Wound Status Wound Number: 1 Primary Etiology: Venous Leg Ulcer Wound Location: Right, Medial Lower Leg Wound Status: Open Wounding Event: Gradually Appeared Comorbid Chronic Obstructive Pulmonary Disease (COPD), History: Hypertension Date Acquired: 07/26/2023 Weeks Of Treatment: 1 Clustered Wound: No Photos AVIANAH, STAHR ([id number]) [id number] Page 10 of 15 Wound Measurements Length: (cm) 0.1 Width: (cm) 0.1 Depth: (cm) 0.1 Area: (cm) 0.008 Volume: (cm) 0.001 % Reduction in Area: 88.7% % Reduction in Volume: 85.7% Epithelialization: Small (1-33%) Wound Description Classification: Full Thickness Without Exposed Support Structures Exudate Amount: Medium Exudate Type: Sanguinous Exudate Color:  red Foul Odor After Cleansing: No Slough/Fibrino No Wound Bed Granulation Amount: Medium (34-66%) Exposed Structure Granulation Quality: Red, Pink Fascia Exposed: No Necrotic Amount: None Present (0%) Fat Layer (Subcutaneous Tissue) Exposed: No Tendon Exposed: No Muscle Exposed: No Joint Exposed: No Bone Exposed: No Treatment Notes Wound #1 (Lower Leg) Wound Laterality: Right, Medial Cleanser Soap and Water Discharge Instruction: Gently cleanse wound with antibacterial soap, rinse and pat dry prior to dressing wounds Vashe 5.8 (oz) Discharge Instruction: Use vashe 5.8 (oz) as directed Peri-Wound Care Topical Primary Dressing Silvercel Small 2x2 (in/in) Discharge Instruction: Apply Silvercel Small 2x2 (in/in) as instructed Secondary Dressing ABD Pad 5x9 (in/in) Discharge Instruction: Cover with ABD pad Secured With Compression Wrap Urgo K2 Lite, two layer compression system, regular Compression Stockings Circaid Juxta Lite Compression Wrap Quantity: 1 Right Leg Compression Amount: 20-30 mmHg Discharge Instruction: Apply Circaid Juxta Lite Compression Wrap as directed Add-Ons Electronic Signature(s) Signed: 08/02/2023 5:46:43 PM By: [name] Signed: 08/07/2023 8:00:25 AM By: Yevonne Pax RN Entered By: [name] on 08/02/2023 07:44:43 Robin Searing ([id number]) [id number] Page 11 of 15 -------------------------------------------------------------------------------- Wound Assessment Details Patient Name: Date of Service: [name], [name] 08/02/2023 10:00 A M Medical Record Number: [id number] Patient Account Number: [id number] Date of Birth/Sex: Treating RN: 04/22/1946 (77 y.o. [name] Primary Care Ludmila Ebarb: [name] [name]: [name] Referring Aly Seidenberg: Treating Tomaz Janis/Extender: Chauncey [name], [name] Weeks in Treatment: 1 Wound Status Wound Number: 2 Primary Etiology:  Venous Leg Ulcer Wound Location: Left, Medial Lower Leg Wound Status: Open Wounding Event: Gradually Appeared Comorbid Chronic Obstructive Pulmonary Disease (COPD), History: Hypertension Date Acquired: 07/26/2023 Weeks Of Treatment: 1 Clustered Wound: No Photos Wound Measurements Length: (cm) 0.1 Width: (cm) 0.1 Depth: (cm) 0.1 Area: (cm) 0.008 Volume: (cm) 0.001 % Reduction in Area: 87.3% % Reduction in Volume: 83.3% Epithelialization: Small (1-33%) Wound Description Classification: Full Thickness Without Exposed Suppor Exudate Amount: Medium Exudate Type: Serosanguineous Exudate Color: red, brown t Structures Foul Odor After Cleansing: No Slough/Fibrino No Wound Bed Granulation Amount: Medium (34-66%) Exposed Structure Granulation Quality: Red, Pink Fascia Exposed: No Necrotic Amount: None Present (0%) Fat Layer (Subcutaneous Tissue) Exposed: No Tendon Exposed: No Muscle Exposed: No Joint Exposed: No Bone Exposed: No Treatment Notes Wound #2 (Lower Leg) Wound Laterality: Left, Medial Cleanser Soap  Medium (34-66%) Exposed Structure Granulation Quality: Red Fascia Exposed: No Fat Layer (Subcutaneous Tissue) Exposed: Yes Tendon Exposed: No Muscle Exposed: No Joint  Exposed: No Bone Exposed: No Treatment Notes Wound #4 (Forearm) Wound Laterality: Right, Anterior Cleanser Byram Ancillary Kit - 15 Day Supply Discharge Instruction: Use supplies as instructed; Kit contains: (15) Saline Bullets; (15) 3x3 Gauze; 15 pr Gloves Soap and Water Discharge Instruction: Gently cleanse wound with antibacterial soap, rinse and pat dry prior to dressing wounds Peri-Wound Care Topical Triamcinolone Acetonide Cream, 0.1%, 15 (g) tube Discharge Instruction: Apply as directed by [name]. Primary Dressing Secondary Dressing telfa nonstick pad Secured With Conform 4'' - Conforming Stretch Gauze Bandage 4x75 (in/in) Discharge Instruction: Apply as directed [name], [name] ([id number]) [id number].pdf Page 15 of 15 Stretch Net, Size 4, 10 (yds) Compression Wrap Compression Stockings Add-Ons Electronic Signature(s) Signed: 08/02/2023 5:46:43 PM By: [name] Signed: 08/07/2023 8:00:25 AM By: Yevonne Pax RN Entered By: [name] on 08/02/2023 07:46:32 -------------------------------------------------------------------------------- Vitals Details Patient Name: Date of Service: [name]. 08/02/2023 10:00 A M Medical Record Number: [id number] Patient Account Number: [id number] Date of Birth/Sex: Treating RN: [birth year] (77 y.o. [name] Primary Care [name]: [name] [name]: [name] Referring [name]: Treating [name]/Extender: [name], [name] Weeks in Treatment: 1 Vital Signs Time Taken: 10:30 Temperature (F): 98.0 Height (in): 63 Pulse (bpm): 91 Weight (lbs): 141.8 Respiratory Rate (breaths/min): 18 Body Mass Index (BMI): 25.1 Blood Pressure (mmHg): 138/87 Reference Range: 80 - 120 mg / dl Electronic Signature(s) Signed: 08/02/2023 5:46:43 PM By: [name] Entered By: [name] on 08/02/2023 07:32:07  [name], [name] ([id number]) [id number] Page 1 of 15 Visit Report for 08/02/2023 Arrival Information Details Patient Name: Date of Service: SELIMA, GUTHIER 08/02/2023 10:00 A M Medical Record Number: [id number] Patient Account Number: [id number] Date of Birth/Sex: Treating RN: 1946-03-31 (77 y.o. [name] Primary Care Scott Fix: [name] [name]: [name] Referring Taisia Fantini: Treating Annisten Manchester/Extender: Chauncey [name], [name] Weeks in Treatment: 1 Visit Information History Since Last Visit All ordered tests and consults were completed: No Patient Arrived: Gilmer Mor Added or deleted any medications: No Arrival Time: 10:18 Any new allergies or adverse reactions: No Transfer Assistance: None Had a fall or experienced change in No Patient Identification Verified: Yes activities of daily living that may affect Secondary Verification Process Completed: Yes risk of falls: Patient Requires Transmission-Based Precautions: No Signs or symptoms of abuse/neglect since last visito No Patient Has Alerts: Yes Hospitalized since last visit: No Patient Alerts: Patient on Blood Thinner Implantable device outside of the clinic excluding No eliquis cellular tissue based products placed in the center R ABI 1.25 TBI 1.08 since last visit: L ABI 1.29 TBI 0.92 Has Dressing in Place as Prescribed: Yes PACEMAKER Has Compression in Place as Prescribed: Yes Pain Present Now: Yes Electronic Signature(s) Signed: 08/02/2023 5:46:43 PM By: [name] Entered By: [name] on 08/02/2023 07:29:16 -------------------------------------------------------------------------------- Clinic Level of Care Assessment Details Patient Name: Date of Service: [name], [name] 08/02/2023 10:00 A M Medical Record Number: [id number] Patient Account Number: [id number] Date of Birth/Sex: Treating RN: October 02, 1946 (77 y.o. [name] Primary Care Tadeo Besecker: [name] [name]: [name] Referring Arlet Marter: Treating Alden Feagan/Extender: Chauncey [name], [name] Weeks in Treatment: 1 Clinic Level of Care Assessment Items TOOL 1 Quantity Score []  - 0 Use when EandM and Procedure is performed on INITIAL visit ASSESSMENTS - Nursing Assessment / Reassessment []  - 0 General Physical Exam (combine w/ comprehensive assessment (listed just below) when performed on new pt. [address] JORDANNE, HELLEN ([id number]) [id number] Page 2 of 15 []  - 0 Comprehensive Assessment (HX, ROS, Risk Assessments, Wounds Hx, etc.) ASSESSMENTS - Wound and Skin Assessment / Reassessment []  - 0 Dermatologic / Skin Assessment (not related to wound area) ASSESSMENTS - Ostomy and/or Continence Assessment and Care []  - 0 Incontinence Assessment and Management []  - 0 Ostomy Care Assessment and Management (repouching, etc.) PROCESS - Coordination of Care []  - 0 Simple Patient / Family Education for ongoing care []  - 0 Complex (extensive) Patient / Family Education for ongoing care []  - 0 Staff obtains Chiropractor, Records, T Results / Process Orders est []  - 0 Staff telephones HHA, Nursing Homes / Clarify orders / etc []  - 0 Routine Transfer to another Facility (non-emergent condition) []  - 0 Routine Hospital Admission (non-emergent condition) []  - 0 New Admissions / Manufacturing engineer / Ordering NPWT Apligraf, etc. , []  - 0 Emergency Hospital Admission (emergent condition) PROCESS - Special Needs []  - 0 Pediatric / Minor Patient Management []  - 0 Isolation Patient Management []  - 0 Hearing / Language / Visual special needs []  - 0 Assessment of Community assistance (transportation, D/C planning, etc.) []  - 0 Additional assistance / Altered mentation []  - 0 Support Surface(s) Assessment (bed, cushion, seat, etc.) INTERVENTIONS - Miscellaneous []  - 0 External ear  exam []  - 0 Patient Transfer (multiple staff / Nurse, adult / Similar devices) []  - 0 Simple Staple / Suture removal (25 or less) []  - 0 Complex Staple /  [name], [name] ([id number]) [id number] Page 1 of 15 Visit Report for 08/02/2023 Arrival Information Details Patient Name: Date of Service: SELIMA, GUTHIER 08/02/2023 10:00 A M Medical Record Number: [id number] Patient Account Number: [id number] Date of Birth/Sex: Treating RN: 1946-03-31 (77 y.o. [name] Primary Care Scott Fix: [name] [name]: [name] Referring Taisia Fantini: Treating Annisten Manchester/Extender: Chauncey [name], [name] Weeks in Treatment: 1 Visit Information History Since Last Visit All ordered tests and consults were completed: No Patient Arrived: Gilmer Mor Added or deleted any medications: No Arrival Time: 10:18 Any new allergies or adverse reactions: No Transfer Assistance: None Had a fall or experienced change in No Patient Identification Verified: Yes activities of daily living that may affect Secondary Verification Process Completed: Yes risk of falls: Patient Requires Transmission-Based Precautions: No Signs or symptoms of abuse/neglect since last visito No Patient Has Alerts: Yes Hospitalized since last visit: No Patient Alerts: Patient on Blood Thinner Implantable device outside of the clinic excluding No eliquis cellular tissue based products placed in the center R ABI 1.25 TBI 1.08 since last visit: L ABI 1.29 TBI 0.92 Has Dressing in Place as Prescribed: Yes PACEMAKER Has Compression in Place as Prescribed: Yes Pain Present Now: Yes Electronic Signature(s) Signed: 08/02/2023 5:46:43 PM By: [name] Entered By: [name] on 08/02/2023 07:29:16 -------------------------------------------------------------------------------- Clinic Level of Care Assessment Details Patient Name: Date of Service: [name], [name] 08/02/2023 10:00 A M Medical Record Number: [id number] Patient Account Number: [id number] Date of Birth/Sex: Treating RN: October 02, 1946 (77 y.o. [name] Primary Care Tadeo Besecker: [name] [name]: [name] Referring Arlet Marter: Treating Alden Feagan/Extender: Chauncey [name], [name] Weeks in Treatment: 1 Clinic Level of Care Assessment Items TOOL 1 Quantity Score []  - 0 Use when EandM and Procedure is performed on INITIAL visit ASSESSMENTS - Nursing Assessment / Reassessment []  - 0 General Physical Exam (combine w/ comprehensive assessment (listed just below) when performed on new pt. [address] JORDANNE, HELLEN ([id number]) [id number] Page 2 of 15 []  - 0 Comprehensive Assessment (HX, ROS, Risk Assessments, Wounds Hx, etc.) ASSESSMENTS - Wound and Skin Assessment / Reassessment []  - 0 Dermatologic / Skin Assessment (not related to wound area) ASSESSMENTS - Ostomy and/or Continence Assessment and Care []  - 0 Incontinence Assessment and Management []  - 0 Ostomy Care Assessment and Management (repouching, etc.) PROCESS - Coordination of Care []  - 0 Simple Patient / Family Education for ongoing care []  - 0 Complex (extensive) Patient / Family Education for ongoing care []  - 0 Staff obtains Chiropractor, Records, T Results / Process Orders est []  - 0 Staff telephones HHA, Nursing Homes / Clarify orders / etc []  - 0 Routine Transfer to another Facility (non-emergent condition) []  - 0 Routine Hospital Admission (non-emergent condition) []  - 0 New Admissions / Manufacturing engineer / Ordering NPWT Apligraf, etc. , []  - 0 Emergency Hospital Admission (emergent condition) PROCESS - Special Needs []  - 0 Pediatric / Minor Patient Management []  - 0 Isolation Patient Management []  - 0 Hearing / Language / Visual special needs []  - 0 Assessment of Community assistance (transportation, D/C planning, etc.) []  - 0 Additional assistance / Altered mentation []  - 0 Support Surface(s) Assessment (bed, cushion, seat, etc.) INTERVENTIONS - Miscellaneous []  - 0 External ear  exam []  - 0 Patient Transfer (multiple staff / Nurse, adult / Similar devices) []  - 0 Simple Staple / Suture removal (25 or less) []  - 0 Complex Staple /

## 2023-08-07 NOTE — Therapy (Signed)
OUTPATIENT PHYSICAL THERAPY NEURO TREATMENT   Patient Name: [name] MRN: [id number] DOB:06/12/1946, 77 y.o., female Today's Date: 08/07/2023   PCP:   [name], MD   REFERRING PROVIDER:   [name], MD    END OF SESSION:  PT End of Session - 08/07/23 0941     Visit Number 4    Number of Visits 24    Date for PT Re-Evaluation 09/20/23    Progress Note Due on Visit 10    PT Start Time 0848    PT Stop Time 0928    PT Time Calculation (min) 40 min    Equipment Utilized During Treatment Gait belt    Activity Tolerance Patient tolerated treatment well    Behavior During Therapy [facility] for tasks assessed/performed               Past Medical History:  Diagnosis Date   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 Known DISH with thoracic spine fusion and moderate kyphosis, h/o L spine fusion L2-5   Adjustment disorder with depressed mood 11/05/2014   32 yo son died MI 2014-10-19 Husband died car accident April 20, 2015   Anemia    Anticoagulant long-term use 06/14/2019   She takes warfarin for afib/flutter with CHADS2VASc score of 4 (age, sex, CHF, HTN), s/p 2 bioprosthetic valve replacements Coumadin changed to eliquis 2022 per cardiology   Aortic atherosclerosis 03/03/2022   Noted on 01/2022 chest CT   Arthritis    Arthropathy of spinal facet joint 03/08/2018   Ascending aortic aneurysm 03/02/2021   4.1cm on CT 01/2021 rec yearly monitoring   Bilateral hip pain 09/08/2016   Cancer (HCC)    conon cancer 2012   CAP (community acquired pneumonia) 02/08/2016   Carotid stenosis 12/08/2020   Carotid US - 40-59% BICA stenosis (2015) Carotid US 12/2020 - 1-39% BICA stenosis, incidental 3.4cm structure behind L common carotid artery rec further imaging    Cataracts, bilateral     immature   Chest pain 08/25/2014   myoview low risk 02/2018 (Camitz)   Chronic cholecystitis with calculus 01/21/2015   S/p cholecystectomy    Chronic diastolic heart failure 09/19/2014   Chronic insomnia    Chronic lower back pain 03/05/2008   Returned to Dr Danielle Dess - translaminar ESI at L3/4. Myelogram showed significant left sided foraminal stenosis at L2/3 and L3/4 in setting of solid arthrodesis, planned DEXA and if stable osteopenia, considering anterolateral decompression with spacer.    CKD (chronic kidney disease) stage 2, GFR 60-89 ml/min 07/03/2015   Constipation    takes Miralax daily as needed   COPD (chronic obstructive pulmonary disease)    Albuterol inhaler daily as needed;Duoneb daily as needed;Spiriva daily   COVID-19 virus infection 11/11/2022   DDD (degenerative disc disease)    cervical - kyphosis with mod DD changes C5/6 and C6/7; lumbar - early DD at L2/3 (Elsner)   Depression    DISH (diffuse idiopathic skeletal hyperostosis) 05/14/2019   By MRI noted fusion of lower thoracic vertebrae - likely contributing to unsteadiness (Elsner)   Dyspnea on exertion 06/05/2015   myoview low risk 02/2018   Edema of both lower legs due to peripheral venous insufficiency    Essential hypertension, benign 03/24/2009   hx of not on nmeds currenlty and has been a while since on meds per pt  Heart murmur    Hematuria 06/17/2021   History of kidney stones    History of radiation therapy 05/30/11 to 07/07/11   rectum   History of rectal cancer 05/26/2011   Rectal cancer 2012, midrectum ypT3ypN0, s/p neoadj chemo&XRT, lap LAR with diverting loop ileostomy 08/2011, ileostomy takedown 03/2012 Discharged from oncology clinic 2017 Truett Perna)   History of shingles    History of vertebral fracture 11/26/2019   Old L1 vertebral compression fracture incidentally noted on imaging 2020    Hyperlipemia    takes Lovastatin daily   Lacunar infarction    Left caudate   Left hip pain 07/29/2020    Legally blind in left eye    Lymphangioma 01/07/2021   Noted incidentally on carotid US 12/2020 CT neck - Likely benign lymphangioma 01/2021   Macrocytosis 03/27/2022   Folate and b12 checked 2023   Malaise and fatigue 01/22/2018   Meralgia paresthetica 12/13/2007   Mixed stress and urge urinary incontinence 03/16/2015   Failed oxybutynin IR/ER. Vesicare initially helped but then no longer helpful.  myrbetriq was not effective.  Saw urology (MacDiarmid) - UDS largely overactive bladder with mild-mod stress incontinence. Improved on abx course - maintained on [place].    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   Pulmonary infiltrates 04/23/2019   06/13/2018-CT super D chest without contrast- generally stable chronic lung disease with pleural parenchymal scarring and scattered nodularity most of these nodules have been tree-in-bud in appearance likely postinfectious or inflammatory the dominant right lower lobe nodule seen on most recent study has resolved no new  or enlarging nodules  04/22/2019-CT chest without contrast- waxing and waning per   Pulmonary nodule 02/23/2012   RLL nodule-31mm stable 2006, April 2009, and June 2009 Small pulm nodules Jan 2012 - > Oct 2012 without change   Rheumatic heart disease mitral stenosis    mod MS by echo 02/2014   Right leg swelling 06/11/2020   R venous US 06/2020 WNL: - No evidence of deep vein thrombosis in the lower extremity. No indirect evidence of obstruction proximal to the inguinal ligament. - No cystic structure found in the popliteal fossa.   S/P aortic and mitral valve bioprostheses 12/08/2013   AVR 21 mm, MVR 25 mm-MagnaEase pericardia   S/P AVR (aortic valve replacement) 2015   bioprosthetic (Bartle)   S/P MVR (mitral valve replacement) 2015   bioprosthetic (Bartle)   Sepsis secondary to UTI (HCC) 08/22/2017   Severe aortic valve stenosis 10/01/2013   mild-mod by echo 02/2014   Skin lesion 01/03/2022   Small bowel obstruction, partial 08/2013   reolved without NGT  placement.    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   Dr Amy [place]   Syncope 05/28/2018   Thrombocytopenia 11/04/2019   Typical atrial flutter 05/15/2019   Vitamin B12 deficiency 02/28/2015   Start B12 shots 02/2015    Vitamin D deficiency 10/23/2016   Wounds, multiple    bilateral legs   Past Surgical History:  Procedure Laterality Date   ANTERIOR LAT LUMBAR FUSION Left 07/17/2018   Procedure: Lumbar Two-Three Lumbar Three-Four Anterolateral decompression/interbody fusion with lateral plate fixation/Infuse;  Surgeon: Barnett Abu, MD;  Location: [facility] OR;  Service: Neurosurgery;  Laterality: Left;  Lumbar Two-Three Lumbar Three-Four Anterolateral decompression/interbody fusion with lateral plate fixation/Infuse   AORTIC VALVE REPLACEMENT N/A 12/12/2013   Procedure: AORTIC VALVE REPLACEMENT (AVR);  Surgeon: Alleen Borne, MD; Service: Open Heart Surgery   BACK SURGERY  2006, 2007   2006 SPACER, 2007  decompression and fusion L4/5   BOWEL RESECTION  04/02/2012   Procedure: SMALL BOWEL RESECTION;  Surgeon: [name], MD;  Location: [facility]S;  Service: General;  Laterality: N/A;   BRONCHIAL WASHINGS  03/24/2021   Procedure: BRONCHIAL WASHINGS;  Surgeon: [name], MD;  Location: [facility] ENDOS[facility];  Service: Endoscopy;;   CARPAL TUNNEL RELEASE Bilateral    CATARACT EXTRACTION W/ INTRAOCULAR LENS IMPLANT Left 10/18/2016   CATARACT EXTRACTION W/ INTRAOCULAR LENS IMPLANT Right 12/13/2016   Dr. Dagoberto Ligas   CHOLECYSTECTOMY N/A 01/21/2015   chronic cholecystitis, [name], MD   COLON RESECTION  08/25/2011   Procedure: COLON RESECTION LAPAROSCOPIC;  Surgeon: [name], MD;  Location: [facility];  Service: General;  Laterality: N/A;  Laparoscopic Assisted Low Anterior Resection Diverting Ostomy and onQ pain pump   COLONOSCOPY  03/2013   1 polyp, rpt 3 yrs Christella Hartigan)   COLONOSCOPY  05/2021   diverticulosis and patent colon anastomosis with some edema/narrowing, rpt 5 yrs Christella Hartigan)   COLONOSCOPY WITH PROPOFOL N/A 06/09/2016   patent colo-colonic anastomosis, rpt 5 yrs Christella Hartigan)   CYSTOSCOPY/URETEROSCOPY/HOLMIUM LASER/STENT PLACEMENT Right 07/03/2023   Procedure: CYSTOSCOPY RIGHT RETROGRADE PYEPLGRAM RIGHT URETEROSCOPY/HOLMIUM LASER/STENT PLACEMENT;  Surgeon: Crista Elliot, MD;  Location: [facility];  Service: Urology;  Laterality: Right;  1 HR FOR CASE   CYSTOSCOPY/URETEROSCOPY/HOLMIUM LASER/STENT PLACEMENT Right 07/17/2023   Procedure: CYSTOSCOPY/RIGHT URETEROSCOPY/STENT EXCHANGE;  Surgeon: Crista Elliot, MD;  Location: [facility];  Service: Urology;  Laterality: Right;  60 MINS FOR CASE   EP IMPLANTABLE DEVICE N/A 06/16/2016   Procedure: Pacemaker Implant;  Surgeon: Will Jorja Loa, MD;  Location: [facility];  Service: Cardiovascular;  Laterality: N/A;   FOOT SURGERY  left foot   hammer toe   ILEOSTOMY  08/25/2011   ILEOSTOMY CLOSURE  04/02/2012   Procedure: ILEOSTOMY TAKEDOWN;   Surgeon: [name], MD;  Location: [facility];  Service: General;  Laterality: N/A;   INTRAOPERATIVE TRANSESOPHAGEAL ECHOCARDIOGRAM N/A 12/12/2013   Procedure: INTRAOPERATIVE TRANSESOPHAGEAL ECHOCARDIOGRAM;  Surgeon: Alleen Borne, MD;  Location: [facility];  Service: Open Heart Surgery;  Laterality: N/A;   LEFT AND RIGHT HEART CATHETERIZATION WITH CORONARY ANGIOGRAM N/A 11/27/2013   Procedure: LEFT AND RIGHT HEART CATHETERIZATION WITH CORONARY ANGIOGRAM;  Surgeon: Thurmon Fair, MD;  Location: [facility] C[facility];  Service: Cardiovascular;  Laterality: N/A;   MITRAL VALVE REPLACEMENT N/A 12/12/2013   Procedure: MITRAL VALVE (MV) REPLACEMENT OR REPAIR;  Surgeon: Alleen Borne, MD; Service: Open Heart Surgery   TEE WITHOUT CARDIOVERSION N/A 11/29/2013   Procedure: TRANSESOPHAGEAL ECHOCARDIOGRAM (TEE);  Surgeon: Gloris Manchester  Thornton Dales, MD;  Location: [facility];  Service: Cardiovascular;  Laterality: N/A;   TEE WITHOUT CARDIOVERSION N/A 06/04/2019   Procedure: TRANSESOPHAGEAL ECHOCARDIOGRAM (TEE);  Surgeon: Thurmon Fair, MD;  Location[facility] END[facility];  Service: Cardiovascular;  Laterality: N/A;   TONSILLECTOMY     VIDEO BRONCHOSCOPY N/A 03/24/2021   Procedure: VIDEO BRONCHOSCOPY WITHOUT FLUORO;  Surgeon: [name], MD;  Location: [facility];  Service: Endoscopy;  Laterality: N/A;  SMALL SCOPE,PREMEDICATE WITH NEBULIZED LIDOCAINE   Patient Active Problem List   Diagnosis Date Noted   Acquired lymphocytopenia 08/02/2023   Pressure sore on heel 06/15/2023   Lump of skin 06/06/2023   Arterial leg ulcer (HCC) 06/06/2023   BRBPR (bright red blood per rectum) 04/24/2023   Head injury 04/24/2023   Cause of injury, fall 03/13/2023   Imbalance 03/13/2023   Venous ulcer of leg (HCC) 01/24/2023   Fatigue 10/05/2022   Positive ANA (antinuclear antibody) 05/30/2022   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   Lacunar infarction (HCC)    Macrocytosis 03/27/2022   Aortic atherosclerosis 03/03/2022   Peripheral  neuropathy 01/03/2022   Hematuria 06/17/2021   Pulmonary artery hypertension 06/17/2021   Pacemaker 03/02/2021   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   Ascending aortic aneurysm (HCC) 03/02/2021   Lymphangioma 01/07/2021   Persistent atrial fibrillation 12/09/2020   Carotid stenosis 12/08/2020   Left hip pain 07/29/2020   Right leg swelling 06/11/2020   History of vertebral fracture 11/26/2019   Acute midline low back pain without sciatica 11/26/2019   Thrombocytopenia 11/04/2019   Anticoagulant long-term use 06/14/2019   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   Typical atrial flutter 05/15/2019   DISH (diffuse idiopathic skeletal hyperostosis) 05/14/2019   Pulmonary infiltrates 04/23/2019   Other spondylosis with radiculopathy, lumbar region 07/17/2018   Syncope 05/28/2018   Arthropathy of spinal facet joint 03/08/2018   Malaise and fatigue 01/22/2018   Pain of right sacroiliac joint 08/09/2017   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 12/31/2016   Vitamin D deficiency 10/23/2016   Bilateral hip pain 09/08/2016   History of colonic polyps    Pedal edema 02/25/2016   Advanced care planning/counseling discussion 10/28/2015   CKD (chronic kidney disease) stage 2, GFR 60-89 ml/min 07/03/2015   Dyspnea on exertion 06/05/2015   Mixed stress and urge urinary incontinence 03/16/2015   Vitamin B12 deficiency 02/28/2015   Chronic insomnia 02/25/2015   Osteoporosis 12/09/2014   Medicare annual wellness visit, subsequent 10/15/2014   Health maintenance examination 10/15/2014   Adjustment disorder with mixed anxiety and depressed mood 10/15/2014   Chronic diastolic heart failure 09/19/2014   Chest pain 08/25/2014   S/P aortic and mitral valve bioprostheses 12/2013   Pulmonary nodules 02/23/2012   History of radiation therapy    Macrocytic anemia 09/21/2011   History of rectal cancer 05/26/2011   Essential hypertension, benign 03/24/2009   Overweight (BMI 25.0-29.9)  03/05/2008   Chronic lower back pain 03/05/2008   Meralgia paresthetica 12/13/2007   GERD (gastroesophageal reflux disease) 08/06/2007   HLD (hyperlipidemia) 05/03/2007   COPD (chronic obstructive pulmonary disease) 05/03/2007    ONSET DATE: 06/10/22  REFERRING DIAG:  M54.50,G89.29 (ICD-10-CM) - Chronic midline low back pain without sciatica  R26.89 (ICD-10-CM) - Imbalance    THERAPY DIAG:  Abnormality of gait and mobility  Difficulty in walking, not elsewhere classified  Muscle weakness (generalized)  Other abnormalities of gait and mobility  Rationale for Evaluation and Treatment: Rehabilitation  SUBJECTIVE:  SUBJECTIVE STATEMENT: Patient reports no significant changes since last visit. Overall she is tired but doing well.   Pt accompanied by: self  PERTINENT HISTORY: Patient has a long history of low back pain, arthritis, COPD, hypertension, compression fracture, hyperlipidemia, visual deficits in the left eye  From Eval: Pt reports with her balance she is falling in scenarios where she begins to feel off balance then she falls. She had a fall when putting out round up and other loss of balance when she was repotting a plant and lost her balance.  Both of her fall she noted were in flexed posture for prolonged period likely resulting in inability to perform adequate extension to get out of this posture resulting in fall.  Pt also has low back pain from " disc fragments" that are in her back. The pain worsens with longer standing. Pt low back pain for years. She cannot go into extension and cannot lay on her stomach.  PAIN:  Are you having pain? Yes: NPRS scale: 5/10 Pain location: low back across both sides.  Pain description: Dull, aching Aggravating factors: standing, walking Relieving  factors: sitting ( flexed postures)  PRECAUTIONS: Fall  RED FLAGS: None   WEIGHT BEARING RESTRICTIONS: No  FALLS: Has patient fallen in last 6 months? Yes. Number of falls 2  LIVING ENVIRONMENT: Lives with: lives with their family Lives in: House/apartment Stairs: Yes: Internal: 3 steps; can reach both Has following equipment at home: Single point cane, quad cane  PLOF: Independent with basic ADLs  PATIENT GOALS: Improve balance, decrease falls, improve her back pain   OBJECTIVE:   DIAGNOSTIC FINDINGS: From MRI of lumbar spine in June 2024 interpreted by radiologist and impression as below  IMPRESSION: 1. No acute fracture or acute finding. 2. Chronic compression fracture of L1 stable from the prior lumbar spine CT. 3. Postoperative changes from fusions, L2-L3, L3-L4 and L4-L5, also stable from the prior C  COGNITION: Overall cognitive status: Within functional limits for tasks assessed   SENSATION: Not tested   POSTURE: rounded shoulders, decreased lumbar lordosis, increased thoracic kyphosis, and flexed trunk   LOWER EXTREMITY ROM:     MMT Right Eval Left Eval  Hip flexion 3+ 3+  Hip extension    Hip abduction 4- 4-  Hip adduction 4 4  Hip internal rotation    Hip external rotation    Knee flexion 3+ 3+  Knee extension 4   Ankle dorsiflexion 4+ 4+  Ankle plantarflexion    Ankle inversion    Ankle eversion     (Blank rows = not tested)   (Blank rows = not tested)  BED MOBILITY:  Pt does not sleep in the bed, currently sleeps in recliner  TRANSFERS: Assistive device utilized: Single point cane  Sit to stand: Modified independence Stand to sit: Modified independence Chair to chair: Modified independence Floor:  does not attempt   STAIRS: Level of Assistance: SBA Stair Negotiation Technique: Alternating Pattern  with Bilateral Rails Number of Stairs: 4  Height of Stairs: 6 in  Comments:   GAIT: Gait pattern: decreased arm swing- Right,  decreased arm swing- Left, decreased step length- Right, decreased step length- Left, decreased stance time- Right, decreased stance time- Left, and trunk flexed Distance walked: 450 ft Assistive device utilized: Walker - 4 wheeled Level of assistance: Modified independence Comments: flexed posture improved with use of rollator but UE fatigued with use of rollator.   FUNCTIONAL TESTS:  5 times sit to stand: 17 sec with  B UE, cannot complete 1 without UE assist   Timed up and go (TUG): 14 sec, UE assist   6 minute walk test: 450 ft with clinic rollator, did nit complete 6 minutes limited by back pain  Dynamic Gait Index: 10 14.82 sec no AD      PATIENT SURVEYS:  FOTO 47 RAG of 53  TODAY'S TREATMENT:        TE Nustep B UE and LE level 1 seat level 7 x 3 min x 2 rounds  NMR Airex stance  3x30 seconds TE Seated LAQ with 3# AW 3 x 8 Standing hip abduction with 3# AW 3x8 Seated HS curls 2 x 10 ea LE with GTB NMR SLS 3x10 seconds each leg TE Seated gluteal press with GTB 2 x 10 ea LE   Pt required occasional rest breaks due fatigue, PT was quick to ask when pt appeared to be fatiguing in order to prevent excessive fatigue.     PATIENT EDUCATION: Education details: POC Person educated: Patient Education method: Explanation Education comprehension: verbalized understanding  HOME EXERCISE PROGRAM: Establish visit 2   GOALS: Goals reviewed with patient? Yes  SHORT TERM GOALS: Target date: 07/27/2023    Patient will be independent in home exercise program to improve strength/mobility for better functional independence with ADLs. Baseline: No HEP currently  Goal status: INITIAL   LONG TERM GOALS: Target date: 09/21/2023   1.  Patient (> 74 years old) will complete five times sit to stand test in < 15 seconds indicating an increased LE strength and improved balance. Baseline: 17 sec with UE assist  Goal status: INITIAL  2.  Patient will increase FOTO score to  equal to or greater than  53   to demonstrate statistically significant improvement in mobility and quality of life.  Baseline: 47 Goal status: INITIAL   3.  Patient will increase DGI Balance score by > 6 points to demonstrate decreased fall risk during functional activities. Baseline: 10 Goal status: INITIAL   4.   Patient will reduce timed up and go to <11 seconds to reduce fall risk and demonstrate improved transfer/gait ability. Baseline: 14 sec, [place] use for STS  Goal status: INITIAL  5.   Patient will increase 10 meter walk test to >1.50m/s as to improve gait speed for better community ambulation and to reduce fall risk. Baseline: .675 m/s no AD  Goal status: INITIAL  6.   Patient will increase six minute walk test distance to >800 without increase in back pain for progression to community ambulator and improve gait ability Baseline: 450 ft with clinic rollator to offset low back pain  Goal status: INITIAL    ASSESSMENT:  CLINICAL IMPRESSION:  Patient presents with excellent motivation for completion of physical therapy activities.Pt challenged with alternating standing and seated activities, pt does show fatigue and difficulty with retro gait between standing and seated exercise transitions.  Will progress to more standing activities next date as pt tolerates.  Pt will continue to benefit from skilled physical therapy intervention to address impairments, improve QOL, and attain therapy goals.     OBJECTIVE IMPAIRMENTS: Abnormal gait, decreased activity tolerance, decreased balance, decreased endurance, decreased mobility, difficulty walking, decreased ROM, decreased strength, hypomobility, and pain.   ACTIVITY LIMITATIONS: carrying, lifting, standing, sleeping, stairs, transfers, and locomotion level  PARTICIPATION LIMITATIONS: driving, community activity, and yard work  PERSONAL FACTORS: Age, Fitness, Time since onset of injury/illness/exacerbation, and 3+ comorbidities:   arthritis, COPD, hypertension, compression fracture, hyperlipidemia,  visual deficits in the left eye  are also affecting patient's functional outcome.   REHAB POTENTIAL: Good  CLINICAL DECISION MAKING: Stable/uncomplicated  EVALUATION COMPLEXITY: Low  PLAN:  PT FREQUENCY: 2x/week  PT DURATION: 12 weeks  PLANNED INTERVENTIONS: Therapeutic exercises, Therapeutic activity, Neuromuscular re-education, Balance training, Gait training, Patient/Family education, Self Care, Joint mobilization, Joint manipulation, Stair training, Dry Needling, and Manual therapy  PLAN FOR NEXT SESSION: HEP, balance, UE strength for walker use, postural strengthening ( post chain), balance    Norman Herrlich, PT 08/07/2023, 11:47 AM

## 2023-08-08 NOTE — Telephone Encounter (Signed)
[name]  Is there a way patient can see me 8:30am 09/04/23 Monday? After her PFt. I see an open slot

## 2023-08-09 ENCOUNTER — Encounter: Payer: Self-pay | Admitting: Physical Therapy

## 2023-08-09 ENCOUNTER — Ambulatory Visit: Payer: Medicare Other | Admitting: Physical Therapy

## 2023-08-09 ENCOUNTER — Encounter: Payer: Medicare Other | Admitting: Internal Medicine

## 2023-08-09 DIAGNOSIS — R269 Unspecified abnormalities of gait and mobility: Secondary | ICD-10-CM | POA: Diagnosis not present

## 2023-08-09 DIAGNOSIS — I87333 Chronic venous hypertension (idiopathic) with ulcer and inflammation of bilateral lower extremity: Secondary | ICD-10-CM | POA: Diagnosis not present

## 2023-08-09 DIAGNOSIS — R262 Difficulty in walking, not elsewhere classified: Secondary | ICD-10-CM

## 2023-08-09 DIAGNOSIS — M6281 Muscle weakness (generalized): Secondary | ICD-10-CM

## 2023-08-09 DIAGNOSIS — R2689 Other abnormalities of gait and mobility: Secondary | ICD-10-CM

## 2023-08-09 NOTE — Therapy (Signed)
OUTPATIENT PHYSICAL THERAPY NEURO TREATMENT   Patient Name: [name] MRN: [id number] DOB:01-Oct-1946, 77 y.o., female Today's Date: 08/09/2023   PCP:   [name], MD   REFERRING PROVIDER:   [name], MD    END OF SESSION:  PT End of Session - 08/09/23 1634     Visit Number 5    Number of Visits 24    Date for PT Re-Evaluation 09/20/23    Progress Note Due on Visit 10    PT Start Time 1019    PT Stop Time 1055    PT Time Calculation (min) 36 min    Equipment Utilized During Treatment Gait belt    Activity Tolerance Patient tolerated treatment well    Behavior During Therapy [facility] for tasks assessed/performed               Past Medical History:  Diagnosis Date   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 Known DISH with thoracic spine fusion and moderate kyphosis, h/o L spine fusion L2-5   Adjustment disorder with depressed mood 11-11-14   15 yo son died MI 10-25-14 Husband died car accident 04/26/2015   Anemia    Anticoagulant long-term use 06/14/2019   She takes warfarin for afib/flutter with CHADS2VASc score of 4 (age, sex, CHF, HTN), s/p 2 bioprosthetic valve replacements Coumadin changed to eliquis 2022 per cardiology   Aortic atherosclerosis 03/03/2022   Noted on 01/2022 chest CT   Arthritis    Arthropathy of spinal facet joint 03/08/2018   Ascending aortic aneurysm 03/02/2021   4.1cm on CT 01/2021 rec yearly monitoring   Bilateral hip pain 09/08/2016   Cancer (HCC)    conon cancer 2012   CAP (community acquired pneumonia) 02/08/2016   Carotid stenosis 12/08/2020   Carotid US - 40-59% BICA stenosis (2015) Carotid US 12/2020 - 1-39% BICA stenosis, incidental 3.4cm structure behind L common carotid artery rec further imaging    Cataracts, bilateral     immature   Chest pain 08/25/2014   myoview low risk 02/2018 (Camitz)   Chronic cholecystitis with calculus 01/21/2015   S/p cholecystectomy    Chronic diastolic heart failure 09/19/2014   Chronic insomnia    Chronic lower back pain 03/05/2008   Returned to Dr Danielle Dess - translaminar ESI at L3/4. Myelogram showed significant left sided foraminal stenosis at L2/3 and L3/4 in setting of solid arthrodesis, planned DEXA and if stable osteopenia, considering anterolateral decompression with spacer.    CKD (chronic kidney disease) stage 2, GFR 60-89 ml/min 07/03/2015   Constipation    takes Miralax daily as needed   COPD (chronic obstructive pulmonary disease)    Albuterol inhaler daily as needed;Duoneb daily as needed;Spiriva daily   COVID-19 virus infection 11/11/2022   DDD (degenerative disc disease)    cervical - kyphosis with mod DD changes C5/6 and C6/7; lumbar - early DD at L2/3 (Elsner)   Depression    DISH (diffuse idiopathic skeletal hyperostosis) 05/14/2019   By MRI noted fusion of lower thoracic vertebrae - likely contributing to unsteadiness (Elsner)   Dyspnea on exertion 06/05/2015   myoview low risk 02/2018   Edema of both lower legs due to peripheral venous insufficiency    Essential hypertension, benign 03/24/2009   hx of not on nmeds currenlty and has been a while since on meds per pt  Heart murmur    Hematuria 06/17/2021   History of kidney stones    History of radiation therapy 05/30/11 to 07/07/11   rectum   History of rectal cancer 05/26/2011   Rectal cancer 2012, midrectum [id number], s/p neoadj chemo&XRT, lap LAR with diverting loop ileostomy 08/2011, ileostomy takedown 03/2012 Discharged from oncology clinic 2017 Truett Perna)   History of shingles    History of vertebral fracture 11/26/2019   Old L1 vertebral compression fracture incidentally noted on imaging 2020    Hyperlipemia    takes Lovastatin daily   Lacunar infarction    Left caudate   Left hip pain 07/29/2020    Legally blind in left eye    Lymphangioma 01/07/2021   Noted incidentally on carotid US 12/2020 CT neck - Likely benign lymphangioma 01/2021   Macrocytosis 03/27/2022   Folate and b12 checked 2023   Malaise and fatigue 01/22/2018   Meralgia paresthetica 12/13/2007   Mixed stress and urge urinary incontinence 03/16/2015   Failed oxybutynin IR/ER. Vesicare initially helped but then no longer helpful.  myrbetriq was not effective.  Saw urology (MacDiarmid) - UDS largely overactive bladder with mild-mod stress incontinence. Improved on abx course - maintained on [place].    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   Pulmonary infiltrates 04/23/2019   06/13/2018-CT super D chest without contrast- generally stable chronic lung disease with pleural parenchymal scarring and scattered nodularity most of these nodules have been tree-in-bud in appearance likely postinfectious or inflammatory the dominant right lower lobe nodule seen on most recent study has resolved no new  or enlarging nodules  04/22/2019-CT chest without contrast- waxing and waning per   Pulmonary nodule 02/23/2012   RLL nodule-59mm stable 2006, April 2009, and June 2009 Small pulm nodules Jan 2012 - > Oct 2012 without change   Rheumatic heart disease mitral stenosis    mod MS by echo 02/2014   Right leg swelling 06/11/2020   R venous US 06/2020 WNL: - No evidence of deep vein thrombosis in the lower extremity. No indirect evidence of obstruction proximal to the inguinal ligament. - No cystic structure found in the popliteal fossa.   S/P aortic and mitral valve bioprostheses 12/08/2013   AVR 21 mm, MVR 25 mm-MagnaEase pericardia   S/P AVR (aortic valve replacement) 2015   bioprosthetic (Bartle)   S/P MVR (mitral valve replacement) 2015   bioprosthetic (Bartle)   Sepsis secondary to UTI (HCC) 08/22/2017   Severe aortic valve stenosis 10/01/2013   mild-mod by echo 02/2014   Skin lesion 01/03/2022   Small bowel obstruction, partial 08/2013   reolved without NGT  placement.    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   Dr Amy [place]   Syncope 05/28/2018   Thrombocytopenia 11/04/2019   Typical atrial flutter 05/15/2019   Vitamin B12 deficiency 02/28/2015   Start B12 shots 02/2015    Vitamin D deficiency 10/23/2016   Wounds, multiple    bilateral legs   Past Surgical History:  Procedure Laterality Date   ANTERIOR LAT LUMBAR FUSION Left 07/17/2018   Procedure: Lumbar Two-Three Lumbar Three-Four Anterolateral decompression/interbody fusion with lateral plate fixation/Infuse;  Surgeon: Barnett Abu, MD;  Location: [facility] OR;  Service: Neurosurgery;  Laterality: Left;  Lumbar Two-Three Lumbar Three-Four Anterolateral decompression/interbody fusion with lateral plate fixation/Infuse   AORTIC VALVE REPLACEMENT N/A 12/12/2013   Procedure: AORTIC VALVE REPLACEMENT (AVR);  Surgeon: Alleen Borne, MD; Service: Open Heart Surgery   BACK SURGERY  2006, 2007   2006 SPACER, 2007  decompression and fusion L4/5   BOWEL RESECTION  04/02/2012   Procedure: SMALL BOWEL RESECTION;  Surgeon: [name], MD;  Location: [facility] ORS;  Service: General;  Laterality: N/A;   BRONCHIAL WASHINGS  03/24/2021   Procedure: BRONCHIAL WASHINGS;  Surgeon: [name], MD;  Location: [facility] ENDOSCOPY;  Service: Endoscopy;;   CARPAL TUNNEL RELEASE Bilateral    CATARACT EXTRACTION W/ INTRAOCULAR LENS IMPLANT Left 10/18/2016   CATARACT EXTRACTION W/ INTRAOCULAR LENS IMPLANT Right 12/13/2016   Dr. Dagoberto Ligas   CHOLECYSTECTOMY N/A 01/21/2015   chronic cholecystitis, [name], MD   COLON RESECTION  08/25/2011   Procedure: COLON RESECTION LAPAROSCOPIC;  Surgeon: [name], MD;  Location: [facility];  Service: General;  Laterality: N/A;  Laparoscopic Assisted Low Anterior Resection Diverting Ostomy and onQ pain pump   COLONOSCOPY  03/2013   1 polyp, rpt 3 yrs Christella Hartigan)   COLONOSCOPY  05/2021   diverticulosis and patent colon anastomosis with some edema/narrowing, rpt 5 yrs Christella Hartigan)   COLONOSCOPY WITH PROPOFOL N/A 06/09/2016   patent colo-colonic anastomosis, rpt 5 yrs Christella Hartigan)   CYSTOSCOPY/URETEROSCOPY/HOLMIUM LASER/STENT PLACEMENT Right 07/03/2023   Procedure: CYSTOSCOPY RIGHT RETROGRADE PYEPLGRAM RIGHT URETEROSCOPY/HOLMIUM LASER/STENT PLACEMENT;  Surgeon: Crista Elliot, MD;  Location: [facility];  Service: Urology;  Laterality: Right;  1 HR FOR CASE   CYSTOSCOPY/URETEROSCOPY/HOLMIUM LASER/STENT PLACEMENT Right 07/17/2023   Procedure: CYSTOSCOPY/RIGHT URETEROSCOPY/STENT EXCHANGE;  Surgeon: Crista Elliot, MD;  Location: [facility];  Service: Urology;  Laterality: Right;  60 MINS FOR CASE   EP IMPLANTABLE DEVICE N/A 06/16/2016   Procedure: Pacemaker Implant;  Surgeon: Will Jorja Loa, MD;  Location: [facility] IN[facility];  Service: Cardiovascular;  Laterality: N/A;   FOOT SURGERY  left foot   hammer toe   ILEOSTOMY  08/25/2011   ILEOSTOMY CLOSURE  04/02/2012   Procedure: ILEOSTOMY TAKEDOWN;   Surgeon: [name], MD;  Location: [facility];  Service: General;  Laterality: N/A;   INTRAOPERATIVE TRANSESOPHAGEAL ECHOCARDIOGRAM N/A 12/12/2013   Procedure: INTRAOPERATIVE TRANSESOPHAGEAL ECHOCARDIOGRAM;  Surgeon: Alleen Borne, MD;  Location: [facility];  Service: Open Heart Surgery;  Laterality: N/A;   LEFT AND RIGHT HEART CATHETERIZATION WITH CORONARY ANGIOGRAM N/A 11/27/2013   Procedure: LEFT AND RIGHT HEART CATHETERIZATION WITH CORONARY ANGIOGRAM;  Surgeon: Thurmon Fair, MD;  Location: [facility] CATH [facility];  Service: Cardiovascular;  Laterality: N/A;   MITRAL VALVE REPLACEMENT N/A 12/12/2013   Procedure: MITRAL VALVE (MV) REPLACEMENT OR REPAIR;  Surgeon: Alleen Borne, MD; Service: Open Heart Surgery   TEE WITHOUT CARDIOVERSION N/A 11/29/2013   Procedure: TRANSESOPHAGEAL ECHOCARDIOGRAM (TEE);  Surgeon: Gloris Manchester  Thornton Dales, MD;  Location: [facility] END[facility];  Service: Cardiovascular;  Laterality: N/A;   TEE WITHOUT CARDIOVERSION N/A 06/04/2019   Procedure: TRANSESOPHAGEAL ECHOCARDIOGRAM (TEE);  Surgeon: Thurmon Fair, MD;  [facility] ENDOSCOPY;  Service: Cardiovascular;  Laterality: N/A;   TONSILLECTOMY     VIDEO BRONCHOSCOPY N/A 03/24/2021   Procedure: VIDEO BRONCHOSCOPY WITHOUT FLUORO;  Surgeon: [name], MD;  Location: [facility];  Service: Endoscopy;  Laterality: N/A;  SMALL SCOPE,PREMEDICATE WITH NEBULIZED LIDOCAINE   Patient Active Problem List   Diagnosis Date Noted   Acquired lymphocytopenia 08/02/2023   Pressure sore on heel 06/15/2023   Lump of skin 06/06/2023   Arterial leg ulcer (HCC) 06/06/2023   BRBPR (bright red blood per rectum) 04/24/2023   Head injury 04/24/2023   Cause of injury, fall 03/13/2023   Imbalance 03/13/2023   Venous ulcer of leg (HCC) 01/24/2023   Fatigue 10/05/2022   Positive ANA (antinuclear antibody) 05/30/2022   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   Lacunar infarction (HCC)    Macrocytosis 03/27/2022   Aortic atherosclerosis 03/03/2022   Peripheral  neuropathy 01/03/2022   Hematuria 06/17/2021   Pulmonary artery hypertension 06/17/2021   Pacemaker 03/02/2021   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   Ascending aortic aneurysm (HCC) 03/02/2021   Lymphangioma 01/07/2021   Persistent atrial fibrillation 12/09/2020   Carotid stenosis 12/08/2020   Left hip pain 07/29/2020   Right leg swelling 06/11/2020   History of vertebral fracture 11/26/2019   Acute midline low back pain without sciatica 11/26/2019   Thrombocytopenia 11/04/2019   Anticoagulant long-term use 06/14/2019   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   Typical atrial flutter 05/15/2019   DISH (diffuse idiopathic skeletal hyperostosis) 05/14/2019   Pulmonary infiltrates 04/23/2019   Other spondylosis with radiculopathy, lumbar region 07/17/2018   Syncope 05/28/2018   Arthropathy of spinal facet joint 03/08/2018   Malaise and fatigue 01/22/2018   Pain of right sacroiliac joint 08/09/2017   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 12/31/2016   Vitamin D deficiency 10/23/2016   Bilateral hip pain 09/08/2016   History of colonic polyps    Pedal edema 02/25/2016   Advanced care planning/counseling discussion 10/28/2015   CKD (chronic kidney disease) stage 2, GFR 60-89 ml/min 07/03/2015   Dyspnea on exertion 06/05/2015   Mixed stress and urge urinary incontinence 03/16/2015   Vitamin B12 deficiency 02/28/2015   Chronic insomnia 02/25/2015   Osteoporosis 12/09/2014   Medicare annual wellness visit, subsequent 10/15/2014   Health maintenance examination 10/15/2014   Adjustment disorder with mixed anxiety and depressed mood 10/15/2014   Chronic diastolic heart failure 09/19/2014   Chest pain 08/25/2014   S/P aortic and mitral valve bioprostheses 12/2013   Pulmonary nodules 02/23/2012   History of radiation therapy    Macrocytic anemia 09/21/2011   History of rectal cancer 05/26/2011   Essential hypertension, benign 03/24/2009   Overweight (BMI 25.0-29.9)  03/05/2008   Chronic lower back pain 03/05/2008   Meralgia paresthetica 12/13/2007   GERD (gastroesophageal reflux disease) 08/06/2007   HLD (hyperlipidemia) 05/03/2007   COPD (chronic obstructive pulmonary disease) 05/03/2007    ONSET DATE: 06/10/22  REFERRING DIAG:  M54.50,G89.29 (ICD-10-CM) - Chronic midline low back pain without sciatica  R26.89 (ICD-10-CM) - Imbalance    THERAPY DIAG:  Abnormality of gait and mobility  Difficulty in walking, not elsewhere classified  Muscle weakness (generalized)  Other abnormalities of gait and mobility  Rationale for Evaluation and Treatment: Rehabilitation  SUBJECTIVE:  SUBJECTIVE STATEMENT: Pt reported feeling pain in the lateral and posterior part of LLE after planting in the garden yesterday. Pt reported 4/10 on NPS in LLE.   Pt accompanied by: self  PERTINENT HISTORY: Patient has a long history of low back pain, arthritis, COPD, hypertension, compression fracture, hyperlipidemia, visual deficits in the left eye  From Eval: Pt reports with her balance she is falling in scenarios where she begins to feel off balance then she falls. She had a fall when putting out round up and other loss of balance when she was repotting a plant and lost her balance.  Both of her fall she noted were in flexed posture for prolonged period likely resulting in inability to perform adequate extension to get out of this posture resulting in fall.  Pt also has low back pain from " disc fragments" that are in her back. The pain worsens with longer standing. Pt low back pain for years. She cannot go into extension and cannot lay on her stomach.  PAIN:  Are you having pain? Yes: NPRS scale: 5/10 Pain location: low back across both sides.  Pain description: Dull,  aching Aggravating factors: standing, walking Relieving factors: sitting ( flexed postures)  PRECAUTIONS: Fall  RED FLAGS: None   WEIGHT BEARING RESTRICTIONS: No  FALLS: Has patient fallen in last 6 months? Yes. Number of falls 2  LIVING ENVIRONMENT: Lives with: lives with their family Lives in: House/apartment Stairs: Yes: Internal: 3 steps; can reach both Has following equipment at home: Single point cane, quad cane  PLOF: Independent with basic ADLs  PATIENT GOALS: Improve balance, decrease falls, improve her back pain   OBJECTIVE:   DIAGNOSTIC FINDINGS: From MRI of lumbar spine in June 2024 interpreted by radiologist and impression as below  IMPRESSION: 1. No acute fracture or acute finding. 2. Chronic compression fracture of L1 stable from the prior lumbar spine CT. 3. Postoperative changes from fusions, L2-L3, L3-L4 and L4-L5, also stable from the prior C  COGNITION: Overall cognitive status: Within functional limits for tasks assessed   SENSATION: Not tested   POSTURE: rounded shoulders, decreased lumbar lordosis, increased thoracic kyphosis, and flexed trunk   LOWER EXTREMITY ROM:     MMT Right Eval Left Eval  Hip flexion 3+ 3+  Hip extension    Hip abduction 4- 4-  Hip adduction 4 4  Hip internal rotation    Hip external rotation    Knee flexion 3+ 3+  Knee extension 4   Ankle dorsiflexion 4+ 4+  Ankle plantarflexion    Ankle inversion    Ankle eversion     (Blank rows = not tested)   (Blank rows = not tested)  BED MOBILITY:  Pt does not sleep in the bed, currently sleeps in recliner  TRANSFERS: Assistive device utilized: Single point cane  Sit to stand: Modified independence Stand to sit: Modified independence Chair to chair: Modified independence Floor:  does not attempt   STAIRS: Level of Assistance: SBA Stair Negotiation Technique: Alternating Pattern  with Bilateral Rails Number of Stairs: 4  Height of Stairs: 6  in  Comments:   GAIT: Gait pattern: decreased arm swing- Right, decreased arm swing- Left, decreased step length- Right, decreased step length- Left, decreased stance time- Right, decreased stance time- Left, and trunk flexed Distance walked: 450 ft Assistive device utilized: Walker - 4 wheeled Level of assistance: Modified independence Comments: flexed posture improved with use of rollator but UE fatigued with use of rollator.   FUNCTIONAL  TESTS:  5 times sit to stand: 17 sec with B UE, cannot complete 1 without UE assist   Timed up and go (TUG): 14 sec, UE assist   6 minute walk test: 450 ft with clinic rollator, did nit complete 6 minutes limited by back pain  Dynamic Gait Index: 10 14.82 sec no AD      PATIENT SURVEYS:  FOTO 47 RAG of 53  TODAY'S TREATMENT:        TE Ambulation 450 feet with rollator 2# AW - cues for proper posture during ambulation required  Seated gluteal press with GTB 3 x 8 ea LE  Seated LAQ with 3# AW 3 x 8 Standing hip abduction with 3# AW 3x8 Seated HS curls 3 x 8 ea LE with GTB Standing Rows BTB 2x10 Standing Shoulder extensions BTB 2x10 NMR Airex stance  3x30 seconds no UE support SLS 3x10 seconds each leg using 2 fingers for support    Pt required occasional rest breaks due fatigue, PT was quick to ask when pt appeared to be fatiguing in order to prevent excessive fatigue.     PATIENT EDUCATION: Education details: POC Person educated: Patient Education method: Explanation Education comprehension: verbalized understanding  HOME EXERCISE PROGRAM: Establish visit 2   GOALS: Goals reviewed with patient? Yes  SHORT TERM GOALS: Target date: 07/27/2023    Patient will be independent in home exercise program to improve strength/mobility for better functional independence with ADLs. Baseline: No HEP currently  Goal status: INITIAL   LONG TERM GOALS: Target date: 09/21/2023   1.  Patient (> 49 years old) will complete five  times sit to stand test in < 15 seconds indicating an increased LE strength and improved balance. Baseline: 17 sec with UE assist  Goal status: INITIAL  2.  Patient will increase FOTO score to equal to or greater than  53   to demonstrate statistically significant improvement in mobility and quality of life.  Baseline: 47 Goal status: INITIAL   3.  Patient will increase DGI Balance score by > 6 points to demonstrate decreased fall risk during functional activities. Baseline: 10 Goal status: INITIAL   4.   Patient will reduce timed up and go to <11 seconds to reduce fall risk and demonstrate improved transfer/gait ability. Baseline: 14 sec, [place] use for STS  Goal status: INITIAL  5.   Patient will increase 10 meter walk test to >1.57m/s as to improve gait speed for better community ambulation and to reduce fall risk. Baseline: .675 m/s no AD  Goal status: INITIAL  6.   Patient will increase six minute walk test distance to >800 without increase in back pain for progression to community ambulator and improve gait ability Baseline: 450 ft with clinic rollator to offset low back pain  Goal status: INITIAL    ASSESSMENT:  CLINICAL IMPRESSION:  Pt responded well to treatment today. Pt reported no increase in pain throughout today's session and was able to correct posture during ambulation with verbal cues. Pt was compliant and motivated to complete all tasks during today's session. Pt expressed fatigue at the conclusion of today's session, which is expected given the increased amount of repetitions and intensity of some tasks compared to last session. Pt will continue to benefit from skilled physical therapy intervention to address impairments, improve QOL, and attain therapy goals.    OBJECTIVE IMPAIRMENTS: Abnormal gait, decreased activity tolerance, decreased balance, decreased endurance, decreased mobility, difficulty walking, decreased ROM, decreased strength, hypomobility, and pain.  ACTIVITY LIMITATIONS: carrying, lifting, standing, sleeping, stairs, transfers, and locomotion level  PARTICIPATION LIMITATIONS: driving, community activity, and yard work  PERSONAL FACTORS: Age, Fitness, Time since onset of injury/illness/exacerbation, and 3+ comorbidities:  arthritis, COPD, hypertension, compression fracture, hyperlipidemia, visual deficits in the left eye  are also affecting patient's functional outcome.   REHAB POTENTIAL: Good  CLINICAL DECISION MAKING: Stable/uncomplicated  EVALUATION COMPLEXITY: Low  PLAN:  PT FREQUENCY: 2x/week  PT DURATION: 12 weeks  PLANNED INTERVENTIONS: Therapeutic exercises, Therapeutic activity, Neuromuscular re-education, Balance training, Gait training, Patient/Family education, Self Care, Joint mobilization, Joint manipulation, Stair training, Dry Needling, and Manual therapy  PLAN FOR NEXT SESSION: HEP, balance, UE strength for walker use, postural strengthening ( post chain), balance    Debara Pickett, Student-PT 08/09/2023, 4:35 PM   This entire session was performed under direct supervision and direction of a licensed therapist/therapist assistant . I have personally read, edited and approve of the note as written.    This licensed clinician was present and actively directing care throughout the session at all times.  Norman Herrlich PT ,DPT Physical Therapist- [place]  [facility]

## 2023-08-10 NOTE — Progress Notes (Signed)
of right upper arm, subsequent encounter 07/26/2023 No Yes Inactive Problems Resolved Problems Electronic Signature(s) Signed: 08/09/2023 4:47:46 PM By: Baltazar Najjar MD Entered By: Baltazar Najjar on 08/09/2023 15:10:20 [name] ([id number]) [phone].pdf Page 4 of 6 -------------------------------------------------------------------------------- Progress Note Details Patient Name: Date of Service: [name], [name] 08/09/2023 2:00 PM Medical Record Number: [id number] Patient Account Number: [id number] Date of Birth/Sex: Treating RN: Apr 05, 1946 (77 y.o. [name] Primary Care Provider: [name] Other Clinician: [name] Referring Provider: Treating Provider/Extender: Chauncey Mann, MICHA EL Burt Knack Weeks in Treatment: 2 Subjective History of Present Illness (HPI) 07/26/2023 ADMISSION This is a 77 year old woman who has been referred for wounds on her bilateral lower legs by her primary physician. She was last seen in primary care on 06/15/2023 at which time she had an abrasion injury to the left anterior shin right medial leg and some scabbing over the left lateral malleolus. She also had bilateral heel issues although they were not open today. A culture of 1 of these wounds showed Pseudomonas I am not exactly sure what antibiotic she received. She had arterial studies done on 07/13/2023 that showed an ABI on the right of 1.25 TBI 1.08 with biphasic and triphasic waveforms. On the left ABI I was 1.29 TBI at 0.92 and triphasic waveforms. All of this suggests that her arterial flow was quite good. The patient states that she has on and off wounds in her  bilateral lower extremities for at least a year. The areas will heal and then reopen. She has been prescribed compression stockings in the past but she cannot get them on. She lives independently. A week ago she had a fall at home causing a skin tear on the right dorsal arm. 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 Her wounds are a lot better with simple compression of an Urgo K2 light. I think she is going to require ongoing compression stockings. She reminds me that she cannot get standard over the toe stockings on. We will look at external compression stockings The area on the right anterior arm we use Xeroform to last week. This apparently stuck to the wound she seemed particularly cross about this. She wants to continue to use triamcinolone and T on it. I did not argue this point elfa 10/30; patient with severe chronic venous insufficiency in her bilateral lower legs. Her wounds have essentially close she does have some eschared areas but nothing is open. She has a single JuxtaLite stocking but we have prior authorization for a second juxta lite stocking. She cannot get standard stockings on her legs. She also had an abrasion on the right arm. This is still open. She has been using triamcinolone on this with a nonstick pad. We tried Vaseline gauze, Xeroform but according to the patient and the stuck to the wound bed. I wonder if the triamcinolone is actually inhibiting healing of this at this point. Objective Constitutional Sitting or standing Blood Pressure is within target range for patient.. Pulse regular and within target range for patient.Marland Kitchen Respirations regular, non-labored and within target range.. Temperature is normal and within the target range for the patient.Marland Kitchen appears in no distress. Vitals Time  Taken: 2:09 PM, Height: 63 in, Weight: 141.8 lbs, BMI: 25.1, Temperature: 98.0 F, Pulse: 81 bpm, Respiratory Rate: 16 breaths/min, Blood Pressure: 130/75 mmHg. General Notes: Wound exam; the  Cleanser: Soap and Water 1 x Per Day/30 Days Discharge Instructions: Gently cleanse wound with antibacterial soap, rinse and pat dry prior to dressing wounds Topical: Triamcinolone Acetonide Cream, 0.1%, 15 (g) tube 1 x Per Day/30 Days Discharge Instructions: Apply as directed by provider. Secondary Dressing: telfa nonstick pad 1 x Per Day/30 Days Secured With: Coban Cohesive Bandage 4x5 (yds) Stretched 1 x Per Day/30 Days Discharge Instructions: Apply Coban as directed. 1. Severe chronic venous insufficiency. All the areas are closed some eschared but I did not remove these. I am going to see if we can maintain this with the juxta lite stockings and moisturizer nightly 2. The wound on the right forearm which was initially traumatic is atrophic and I am wondering whether the triamcinolone is actually inhibiting final epithelialization here. We tried Xeroform, Vaseline gauze at some point but she says these were adherent and were hard to get off. Nevertheless I think I conveyed my concern. We did not prescribe the triamcinolone I am not exactly sure how long she has been on this Electronic Signature(s) Signed: 08/09/2023 4:47:46 PM By: Baltazar Najjar MD Entered By: Baltazar Najjar on 08/09/2023 15:14:39 SuperBill Details -------------------------------------------------------------------------------- [name] ([id number]) [id number] Page 6 of 6 Patient Name: Date of Service: [name], [name] 08/09/2023 Medical Record Number: [id number] Patient Account Number: [id number] Date of Birth/Sex: Treating RN: [birth year] (77 y.o. 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 S40.811D Abrasion of right upper arm, subsequent encounter Facility Procedures CPT4 Code Description Modifier Quantity [id number] - WOUND CARE VISIT-LEV 3 EST PT 1 Physician Procedures Quantity CPT4 Code Description Modifier [id number] - WC PHYS LEVEL 3 - EST PT 1 ICD-10 Diagnosis Description L97.821 Non-pressure chronic ulcer of other part of left lower leg limited to breakdown of skin L97.818 Non-pressure chronic ulcer of other part of right lower leg with other specified severity I87.333 Chronic venous hypertension (idiopathic) with ulcer and inflammation of bilateral lower extremity L97.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 Temperature is normal and within the target range for the patient.Marland Kitchen appears in no distress. Notes Wound exam; the small wounds have essentially close there are eschared areas here but I did not remove the small eschared spots. We are going to try and transition her into a JuxtaLite stocking to the left leg. We gave her Tubigrip for the right leg and she should be getting a second juxta lite stocking for the right leg. We have good edema control. No evidence of infection Her right forearm is still open not epithelialized. It is pale somewhat benign looking but still not epithelializing. Electronic Signature(s) Signed: 08/09/2023 4:47:46 PM By: Baltazar Najjar MD Entered By: Baltazar Najjar on 08/09/2023 15:13:35 -------------------------------------------------------------------------------- Physician Orders Details Patient Name: Date of Service: [name] 08/09/2023 2:00 PM Medical Record Number: [id number] Patient Account Number: [id number] Date of Birth/Sex: Treating RN: 1946-01-26 (77 y.o. [name] Primary Care Provider: [name] Other Clinician: [name] Referring Provider: Treating Provider/Extender: RO BSO Dorris Carnes, MICHA EL Burt Knack Weeks in Treatment: 2 The following information was scribed by: [name] The information was scribed for: Maxwell Caul Verbal / Phone Orders: No Diagnosis Coding Follow-up Appointments Return Appointment in 2 weeks. Bathing/ [employer] wounds with antibacterial soap and water. May shower; gently cleanse wound with antibacterial soap, rinse and pat dry prior to dressing wounds No tub bath. Anesthetic (Use 'Patient Medications' Section for Anesthetic Order Entry) Lidocaine applied to wound bed Edema Control - Orders / Instructions Tubigrip single layer applied. - Right lower leg until she receives second Juxtalite wrap Patient to wear own Velcro  compression garment. Remove compression stockings every night before going to bed and put on every morning when getting up. - Left lower leg Wound Treatment Wound #4 - Forearm Wound Laterality: Right, Anterior Cleanser: Byram Ancillary Kit - 15 Day Supply (Generic) 1 x Per Day/30 Days Discharge Instructions: Use supplies as instructed; Kit contains: (15) Saline Bullets; (15) 3x3 Gauze; 15 pr Gloves Cleanser: Soap and Water 1 x Per Day/30 Days Discharge Instructions: Gently cleanse wound with antibacterial soap, rinse and pat dry prior to dressing wounds Topical: Triamcinolone Acetonide Cream, 0.1%, 15 (g) tube 1 x Per Day/30 Days Discharge Instructions: Apply as directed by provider. Secondary Dressing: telfa nonstick pad 1 x Per Day/30 Days SHIYAN, EMPEY ([id number]) [phone].pdf Page 3 of 6 Secured With: Coban Cohesive Bandage 4x5 (yds) Stretched 1 x Per Day/30 Days Discharge Instructions: Apply Coban as directed. Electronic Signature(s) Signed: 08/09/2023 4:47:46 PM By: Baltazar Najjar MD Signed: 08/09/2023 5:07:07 PM By: [name] Entered By: [name] on 08/09/2023 15:06:45 -------------------------------------------------------------------------------- Problem List Details Patient Name: Date of Service: [name] 08/09/2023 2:00 PM Medical Record Number: [id number] Patient Account Number: [id number] Date of Birth/Sex: Treating RN: [birth year] (77 y.o. [name] Primary Care Provider: [name] Other Clinician: [name] Referring Provider: Treating Provider/Extender: Chauncey Mann, MICHA EL Burt Knack Weeks in Treatment: 2 Active Problems ICD-10 Encounter Code Description Active Date MDM Diagnosis I87.333 Chronic venous hypertension (idiopathic) with ulcer and inflammation of 07/26/2023 No Yes bilateral lower extremity L97.821 Non-pressure chronic ulcer of other part of left lower leg limited to  breakdown 07/26/2023 No Yes of skin L97.818 Non-pressure chronic ulcer of other part of right lower leg with other specified 07/26/2023 No Yes severity L97.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 [name] Primary Care Provider: [name] Other Clinician: [name] Referring Provider: Treating Provider/Extender: Chauncey Mann, MICHA EL Burt Knack Weeks in Treatment: 2 History of Present Illness HPI Description: 07/26/2023 ADMISSION This is a 77 year old woman who has been referred for wounds on her bilateral lower legs by her primary physician. She was last seen in primary care on 06/15/2023 at which time she had an abrasion injury to the left anterior shin right medial leg and some scabbing over the left lateral malleolus. She also had bilateral heel issues although they were not open today. A culture of 1 of these wounds showed Pseudomonas I am not exactly sure what antibiotic she received. She had arterial studies done on 07/13/2023 that showed an ABI on the right of 1.25 TBI 1.08 with biphasic and triphasic waveforms. On the left ABI I was 1.29 TBI at 0.92 and triphasic waveforms. All of this suggests that her arterial flow was quite good. The patient states that she has on and off wounds in her bilateral lower extremities for at least a year. The areas will heal and then reopen. She has been prescribed compression stockings in the past but she cannot get them on. She lives independently. A week ago she had a fall at home causing a skin tear on the right dorsal arm. 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 Her wounds are a lot better with simple compression of an  Urgo K2 light. I think she is going to require ongoing compression stockings. She reminds me that she cannot get standard over the toe stockings on. We will look at external compression stockings The area on the right anterior arm we use Xeroform to last week. This apparently stuck to the wound she seemed particularly cross about this. She wants to continue to use triamcinolone and T on it. I did not argue this point elfa 10/30; patient with severe chronic venous insufficiency in her bilateral lower legs. Her wounds have essentially close she does have some eschared areas but nothing is open. She has a single JuxtaLite stocking but we have prior authorization for a second juxta lite stocking. She cannot get standard stockings on her legs. She also had an abrasion on the right arm. This is still open. She has been using triamcinolone on this with a nonstick pad. We tried Vaseline gauze, Xeroform but according to the patient and the stuck to the wound bed. I wonder if the triamcinolone is actually inhibiting healing of this at this point. Electronic Signature(s) Signed: 08/09/2023 4:47:46 PM By: Baltazar Najjar MD Entered By: Baltazar Najjar on 08/09/2023 15:11:56 -------------------------------------------------------------------------------- Physical Exam Details Patient Name: Date of Service: [name] 08/09/2023 2:00 PM Medical Record Number: [id number] Patient Account Number: [id number] [name], [name] ([id number]) [phone].pdf Page 2 of 6 Date of Birth/Sex: Treating RN: [birth year] (77 y.o. [name] Primary Care Provider: Other Clinician: [facility] Referring Provider: Treating Provider/Extender: RO BSO N, MICHA EL Burt Knack Weeks in Treatment: 2 Constitutional Sitting or standing Blood Pressure is within target range for patient.. Pulse regular and within target range  for patient.Marland Kitchen Respirations regular,  non-labored and within target range.. Temperature is normal and within the target range for the patient.Marland Kitchen appears in no distress. Notes Wound exam; the small wounds have essentially close there are eschared areas here but I did not remove the small eschared spots. We are going to try and transition her into a JuxtaLite stocking to the left leg. We gave her Tubigrip for the right leg and she should be getting a second juxta lite stocking for the right leg. We have good edema control. No evidence of infection Her right forearm is still open not epithelialized. It is pale somewhat benign looking but still not epithelializing. Electronic Signature(s) Signed: 08/09/2023 4:47:46 PM By: Baltazar Najjar MD Entered By: Baltazar Najjar on 08/09/2023 15:13:35 -------------------------------------------------------------------------------- Physician Orders Details Patient Name: Date of Service: [name] 08/09/2023 2:00 PM Medical Record Number: [id number] Patient Account Number: [id number] Date of Birth/Sex: Treating RN: 1946-01-26 (77 y.o. [name] Primary Care Provider: [name] Other Clinician: [name] Referring Provider: Treating Provider/Extender: RO BSO Dorris Carnes, MICHA EL Burt Knack Weeks in Treatment: 2 The following information was scribed by: [name] The information was scribed for: Maxwell Caul Verbal / Phone Orders: No Diagnosis Coding Follow-up Appointments Return Appointment in 2 weeks. Bathing/ [employer] wounds with antibacterial soap and water. May shower; gently cleanse wound with antibacterial soap, rinse and pat dry prior to dressing wounds No tub bath. Anesthetic (Use 'Patient Medications' Section for Anesthetic Order Entry) Lidocaine applied to wound bed Edema Control - Orders / Instructions Tubigrip single layer applied. - Right lower leg until she receives second Juxtalite wrap Patient to wear own Velcro  compression garment. Remove compression stockings every night before going to bed and put on every morning when getting up. - Left lower leg Wound Treatment Wound #4 - Forearm Wound Laterality: Right, Anterior Cleanser: Byram Ancillary Kit - 15 Day Supply (Generic) 1 x Per Day/30 Days Discharge Instructions: Use supplies as instructed; Kit contains: (15) Saline Bullets; (15) 3x3 Gauze; 15 pr Gloves Cleanser: Soap and Water 1 x Per Day/30 Days Discharge Instructions: Gently cleanse wound with antibacterial soap, rinse and pat dry prior to dressing wounds Topical: Triamcinolone Acetonide Cream, 0.1%, 15 (g) tube 1 x Per Day/30 Days Discharge Instructions: Apply as directed by provider. Secondary Dressing: telfa nonstick pad 1 x Per Day/30 Days SHIYAN, EMPEY ([id number]) [phone].pdf Page 3 of 6 Secured With: Coban Cohesive Bandage 4x5 (yds) Stretched 1 x Per Day/30 Days Discharge Instructions: Apply Coban as directed. Electronic Signature(s) Signed: 08/09/2023 4:47:46 PM By: Baltazar Najjar MD Signed: 08/09/2023 5:07:07 PM By: [name] Entered By: [name] on 08/09/2023 15:06:45 -------------------------------------------------------------------------------- Problem List Details Patient Name: Date of Service: [name] 08/09/2023 2:00 PM Medical Record Number: [id number] Patient Account Number: [id number] Date of Birth/Sex: Treating RN: [birth year] (77 y.o. [name] Primary Care Provider: [name] Other Clinician: [name] Referring Provider: Treating Provider/Extender: Chauncey Mann, MICHA EL Burt Knack Weeks in Treatment: 2 Active Problems ICD-10 Encounter Code Description Active Date MDM Diagnosis I87.333 Chronic venous hypertension (idiopathic) with ulcer and inflammation of 07/26/2023 No Yes bilateral lower extremity L97.821 Non-pressure chronic ulcer of other part of left lower leg limited to  breakdown 07/26/2023 No Yes of skin L97.818 Non-pressure chronic ulcer of other part of right lower leg with other specified 07/26/2023 No Yes severity L97.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

## 2023-08-10 NOTE — Progress Notes (Signed)
[name] ([id number]) [id number] Page 1 of 13 Visit Report for 08/09/2023 Arrival Information Details Patient Name: Date of Service: [name], [name] 08/09/2023 2:00 PM Medical Record Number: [id number] Patient Account Number: [id number] Date of Birth/Sex: Treating RN: 07-10-1946 (77 y.o. [name] Primary Care Ayanah Snader: [name] Other Clinician: [name] Referring Kenyon Eichelberger: Treating Germaine Ripp/Extender: Chauncey Mann, MICHA EL Burt Knack Weeks in Treatment: 2 Visit Information History Since Last Visit All ordered tests and consults were completed: No Patient Arrived: Gilmer Mor Added or deleted any medications: No Arrival Time: 14:08 Any new allergies or adverse reactions: No Transfer Assistance: None Had a fall or experienced change in No Patient Identification Verified: Yes activities of daily living that may affect Secondary Verification Process Completed: Yes risk of falls: Patient Requires Transmission-Based Precautions: No Signs or symptoms of abuse/neglect since last visito No Patient Has Alerts: Yes Hospitalized since last visit: No Patient Alerts: Patient on Blood Thinner Implantable device outside of the clinic excluding No eliquis cellular tissue based products placed in the center R ABI 1.25 TBI 1.08 since last visit: L ABI 1.29 TBI 0.92 Has Dressing in Place as Prescribed: Yes PACEMAKER Has Compression in Place as Prescribed: Yes Pain Present Now: No Electronic Signature(s) Signed: 08/09/2023 5:07:07 PM By: [name] Entered By: [name] on 08/09/2023 11:09:11 -------------------------------------------------------------------------------- Clinic Level of Care Assessment Details Patient Name: Date of Service: Diane, [name] 08/09/2023 2:00 PM Medical Record Number: [id number] Patient Account Number: [id number] Date of Birth/Sex: Treating RN: 05-06-1946 (77 y.o. [name] Primary  Care Yarlin Breisch: [name] Other Clinician: [name] Referring Emara Lichter: Treating Wilhelm Ganaway/Extender: Chauncey Mann, MICHA EL Burt Knack Weeks in Treatment: 2 Clinic Level of Care Assessment Items TOOL 4 Quantity Score []  - 0 Use when only an EandM is performed on FOLLOW-UP visit ASSESSMENTS - Nursing Assessment / Reassessment X- 1 10 Reassessment of Co-morbidities (includes updates in patient status) HOUA, KUNKEL ([id number]) [phone].pdf Page 2 of 13 X- 1 5 Reassessment of Adherence to Treatment Plan ASSESSMENTS - Wound and Skin A ssessment / Reassessment X - Simple Wound Assessment / Reassessment - one wound 1 5 []  - 0 Complex Wound Assessment / Reassessment - multiple wounds []  - 0 Dermatologic / Skin Assessment (not related to wound area) ASSESSMENTS - Focused Assessment X- 1 5 Circumferential Edema Measurements - multi extremities []  - 0 Nutritional Assessment / Counseling / Intervention []  - 0 Lower Extremity Assessment (monofilament, tuning fork, pulses) []  - 0 Peripheral Arterial Disease Assessment (using hand held doppler) ASSESSMENTS - Ostomy and/or Continence Assessment and Care []  - 0 Incontinence Assessment and Management []  - 0 Ostomy Care Assessment and Management (repouching, etc.) PROCESS - Coordination of Care X - Simple Patient / Family Education for ongoing care 1 15 []  - 0 Complex (extensive) Patient / Family Education for ongoing care []  - 0 Staff obtains Chiropractor, Records, T Results / Process Orders est []  - 0 Staff telephones HHA, Nursing Homes / Clarify orders / etc []  - 0 Routine Transfer to another Facility (non-emergent condition) []  - 0 Routine Hospital Admission (non-emergent condition) []  - 0 New Admissions / Manufacturing engineer / Ordering NPWT Apligraf, etc. , []  - 0 Emergency Hospital Admission (emergent condition) X- 1 10 Simple Discharge Coordination []  - 0 Complex (extensive)  Discharge Coordination PROCESS - Special Needs []  - 0 Pediatric / Minor Patient Management []  - 0 Isolation Patient Management []  - 0 Hearing / Language / Visual special needs []  -  [name] ([id number]) [id number] Page 1 of 13 Visit Report for 08/09/2023 Arrival Information Details Patient Name: Date of Service: [name], [name] 08/09/2023 2:00 PM Medical Record Number: [id number] Patient Account Number: [id number] Date of Birth/Sex: Treating RN: 07-10-1946 (77 y.o. [name] Primary Care Ayanah Snader: [name] Other Clinician: [name] Referring Kenyon Eichelberger: Treating Germaine Ripp/Extender: Chauncey Mann, MICHA EL Burt Knack Weeks in Treatment: 2 Visit Information History Since Last Visit All ordered tests and consults were completed: No Patient Arrived: Gilmer Mor Added or deleted any medications: No Arrival Time: 14:08 Any new allergies or adverse reactions: No Transfer Assistance: None Had a fall or experienced change in No Patient Identification Verified: Yes activities of daily living that may affect Secondary Verification Process Completed: Yes risk of falls: Patient Requires Transmission-Based Precautions: No Signs or symptoms of abuse/neglect since last visito No Patient Has Alerts: Yes Hospitalized since last visit: No Patient Alerts: Patient on Blood Thinner Implantable device outside of the clinic excluding No eliquis cellular tissue based products placed in the center R ABI 1.25 TBI 1.08 since last visit: L ABI 1.29 TBI 0.92 Has Dressing in Place as Prescribed: Yes PACEMAKER Has Compression in Place as Prescribed: Yes Pain Present Now: No Electronic Signature(s) Signed: 08/09/2023 5:07:07 PM By: [name] Entered By: [name] on 08/09/2023 11:09:11 -------------------------------------------------------------------------------- Clinic Level of Care Assessment Details Patient Name: Date of Service: Diane, [name] 08/09/2023 2:00 PM Medical Record Number: [id number] Patient Account Number: [id number] Date of Birth/Sex: Treating RN: 05-06-1946 (77 y.o. [name] Primary  Care Yarlin Breisch: [name] Other Clinician: [name] Referring Emara Lichter: Treating Wilhelm Ganaway/Extender: Chauncey Mann, MICHA EL Burt Knack Weeks in Treatment: 2 Clinic Level of Care Assessment Items TOOL 4 Quantity Score []  - 0 Use when only an EandM is performed on FOLLOW-UP visit ASSESSMENTS - Nursing Assessment / Reassessment X- 1 10 Reassessment of Co-morbidities (includes updates in patient status) HOUA, KUNKEL ([id number]) [phone].pdf Page 2 of 13 X- 1 5 Reassessment of Adherence to Treatment Plan ASSESSMENTS - Wound and Skin A ssessment / Reassessment X - Simple Wound Assessment / Reassessment - one wound 1 5 []  - 0 Complex Wound Assessment / Reassessment - multiple wounds []  - 0 Dermatologic / Skin Assessment (not related to wound area) ASSESSMENTS - Focused Assessment X- 1 5 Circumferential Edema Measurements - multi extremities []  - 0 Nutritional Assessment / Counseling / Intervention []  - 0 Lower Extremity Assessment (monofilament, tuning fork, pulses) []  - 0 Peripheral Arterial Disease Assessment (using hand held doppler) ASSESSMENTS - Ostomy and/or Continence Assessment and Care []  - 0 Incontinence Assessment and Management []  - 0 Ostomy Care Assessment and Management (repouching, etc.) PROCESS - Coordination of Care X - Simple Patient / Family Education for ongoing care 1 15 []  - 0 Complex (extensive) Patient / Family Education for ongoing care []  - 0 Staff obtains Chiropractor, Records, T Results / Process Orders est []  - 0 Staff telephones HHA, Nursing Homes / Clarify orders / etc []  - 0 Routine Transfer to another Facility (non-emergent condition) []  - 0 Routine Hospital Admission (non-emergent condition) []  - 0 New Admissions / Manufacturing engineer / Ordering NPWT Apligraf, etc. , []  - 0 Emergency Hospital Admission (emergent condition) X- 1 10 Simple Discharge Coordination []  - 0 Complex (extensive)  Discharge Coordination PROCESS - Special Needs []  - 0 Pediatric / Minor Patient Management []  - 0 Isolation Patient Management []  - 0 Hearing / Language / Visual special needs []  -  Wound Chart Details Patient Name: Date of Service: NEHAL, KOLESZAR 08/09/2023 2:00 PM Medical Record Number: [id number] Patient Account Number: [id number] Date of Birth/Sex: Treating RN: 1946/04/24 (77 y.o. [name] Primary Care Cliff Damiani: [name] Other Clinician: [name] Referring Destenee Guerry: Treating Quetzal Meany/Extender: Chauncey Mann, MICHA EL Burt Knack Weeks in Treatment: 2 Vital Signs CHARLIEANN, EVERARD ([id number]) [id number] Page 5 of 13 Height(in): 63 Pulse(bpm): 81 Weight(lbs): 141.8 Blood Pressure(mmHg): 130/75 Body Mass Index(BMI): 25.1 Temperature(F): 98.0 Respiratory Rate(breaths/min): 16 [1:Photos: No Photos Right, Medial Lower Leg Wound Location: Gradually Appeared Wounding Event: Venous Leg Ulcer Primary Etiology: Chronic Obstructive Pulmonary Comorbid History: Disease (COPD), Hypertension 07/26/2023 Date Acquired: 2 Weeks of Treatment: Healed -  Epithelialized Wound Status: No Wound Recurrence: 0x0x0 Measurements L x W x D (cm) 0 A (cm) : rea 0 Volume (cm) : 100.00% % Reduction in Area: 100.00% % Reduction in Volume: Full Thickness Without Exposed Classification: Support Structures None Present  Exudate Amount: N/A Exudate Type: N/A Exudate Color: None Present (0%) Granulation Amount: N/A Granulation Quality: None Present (0%) Necrotic Amount: Fascia: No Exposed Structures: Fat Layer  (Subcutaneous Tissue): No Tendon: No Muscle: No Joint: No  Bone: No Large (67-100%) Epithelialization:] [2:No Photos Left, Medial Lower Leg Gradually Appeared Venous Leg Ulcer Chronic Obstructive Pulmonary Disease (COPD), Hypertension 07/26/2023 2 Healed - Epithelialized No 0x0x0 0 0 100.00% 100.00% Full  Thickness Without Exposed Support Structures None Present N/A N/A None Present (0%) N/A None Present (0%) Fascia: No Fat Layer (Subcutaneous Tissue): No Tendon: No Muscle: No Joint: No Bone: No Large (67-100%)] [3:No Photos Left, Lateral Malleolus  Gradually Appeared Atypical Chronic Obstructive Pulmonary Disease (COPD), Hypertension 07/26/2023 2 Healed - Epithelialized No 0x0x0 0 0 100.00% 100.00% Full Thickness Without Exposed Support Structures Medium Sanguinous red Medium (34-66%) Red N/A  Fascia: No Fat Layer (Subcutaneous Tissue): No Tendon: No Muscle: No Joint: No Bone: No Small (1-33%)] Wound Number: 4 N/A N/A Photos: N/A N/A Right, Anterior Forearm N/A N/A Wound Location: Skin T ear/Laceration N/A N/A Wounding Event: Skin T ear N/A N/A Primary Etiology: Chronic Obstructive Pulmonary N/A N/A Comorbid History: Disease (COPD), Hypertension 07/19/2023 N/A N/A Date Acquired: 2 N/A N/A Weeks of Treatment: Open N/A N/A Wound Status: No N/A N/A Wound Recurrence: 5x2.5x0.1 N/A N/A Measurements L x W x D (cm) 9.817 N/A N/A A (cm) : rea 0.982 N/A N/A Volume (cm) : -20.20% N/A N/A % Reduction in Area: -20.20% N/A N/A % Reduction in Volume: Full Thickness Without Exposed N/A N/A Classification: Support Structures Large N/A N/A Exudate Amount: Sanguinous N/A N/A Exudate Type: red N/A N/A Exudate Color: Medium (34-66%) N/A N/A Granulation Amount: Red N/A N/A Granulation Quality: N/A N/A N/A Necrotic Amount: Fat Layer (Subcutaneous Tissue): Yes N/A N/A Exposed Structures: Fascia: No Tendon: No Muscle: No Joint: No Bone: No Small (1-33%) N/A  N/A Epithelialization: Treatment Notes LENYA, WERDEN ([id number]) [id number] Page 6 of 13 Electronic Signature(s) Signed: 08/09/2023 5:07:07 PM By: [name] Entered By: [name] on 08/09/2023 11:48:21 -------------------------------------------------------------------------------- Multi-Disciplinary Care Plan Details Patient Name: Date of Service: [name] 08/09/2023 2:00 PM Medical Record Number: [id number] Patient Account Number: [id number] Date of Birth/Sex: Treating RN: 1946-05-25 (77 y.o. [name] Primary Care Malayla Granberry: [name] Other Clinician: [name] Referring Mirka Barbone: Treating Yoshie Kosel/Extender: RO BSO Dorris Carnes, MICHA EL Burt Knack Weeks in Treatment: 2 Active Inactive Orientation to the Wound Care Program Nursing Diagnoses: Knowledge deficit related to the wound healing center program Goals:  Patient/caregiver will verbalize understanding of the Wound Healing Center Program Date Initiated: 07/26/2023 Target Resolution Date: 08/02/2023 Goal Status: Active Interventions: Provide education on orientation to the wound center Notes: Venous Leg Ulcer Nursing Diagnoses: Actual venous Insuffiency (use after diagnosis is confirmed) Knowledge deficit related to disease process and management Potential for venous Insuffiency (use before diagnosis confirmed) Goals: Patient will maintain optimal edema control Date Initiated: 07/26/2023 Target Resolution Date: 08/26/2023 Goal Status: Active Patient/caregiver will verbalize understanding of disease process and disease management Date Initiated: 07/26/2023 Target Resolution Date: 08/26/2023 Goal Status: Active Verify adequate tissue perfusion prior to therapeutic compression application Date Initiated: 07/26/2023 Target Resolution Date: 08/26/2023 Goal Status: Active Interventions: Assess peripheral edema status every visit. Compression as  ordered Provide education on venous insufficiency Treatment Activities: Therapeutic compression applied : 07/26/2023 Notes: Wound/Skin Impairment Nursing Diagnoses: ORETHA, ABBY ([id number]) [id number] Page 7 of 13 Impaired tissue integrity Knowledge deficit related to ulceration/compromised skin integrity Goals: Patient/caregiver will verbalize understanding of skin care regimen Date Initiated: 07/26/2023 Target Resolution Date: 08/26/2023 Goal Status: Active Ulcer/skin breakdown will have a volume reduction of 30% by week 4 Date Initiated: 07/26/2023 Target Resolution Date: 08/26/2023 Goal Status: Active Ulcer/skin breakdown will have a volume reduction of 50% by week 8 Date Initiated: 07/26/2023 Target Resolution Date: 09/25/2023 Goal Status: Active Ulcer/skin breakdown will have a volume reduction of 80% by week 12 Date Initiated: 07/26/2023 Target Resolution Date: 10/26/2023 Goal Status: Active Ulcer/skin breakdown will heal within 14 weeks Date Initiated: 07/26/2023 Target Resolution Date: 11/09/2023 Goal Status: Active Interventions: Assess patient/caregiver ability to obtain necessary supplies Assess patient/caregiver ability to perform ulcer/skin care regimen upon admission and as needed Assess ulceration(s) every visit Provide education on ulcer and skin care Treatment Activities: Referred to DME Thuy Atilano for dressing supplies : 07/26/2023 Skin care regimen initiated : 07/26/2023 Notes: Electronic Signature(s) Signed: 08/09/2023 5:07:07 PM By: [name] Signed: 08/10/2023 3:34:18 PM By: Yevonne Pax RN Entered By: [name] on 08/09/2023 12:07:37 -------------------------------------------------------------------------------- Pain Assessment Details Patient Name: Date of Service: [name] 08/09/2023 2:00 PM Medical Record Number: [id number] Patient Account Number: [id number] Date of Birth/Sex: Treating  RN: 09-21-1946 (77 y.o. [name] Primary Care Kayde Warehime: [name] Other Clinician: [name] Referring Elysse Polidore: Treating Tallin Hart/Extender: RO BSO Dorris Carnes, MICHA EL Burt Knack Weeks in Treatment: 2 Active Problems Location of Pain Severity and Description of Pain Patient Has Paino No Site Locations [place] ([id number]) [id number] Page 8 of 13 Pain Management and Medication Current Pain Management: Electronic Signature(s) Signed: 08/09/2023 5:07:07 PM By: [name] Signed: 08/10/2023 3:34:18 PM By: Yevonne Pax RN Entered By: [name] on 08/09/2023 11:11:09 -------------------------------------------------------------------------------- Patient/Caregiver Education Details Patient Name: Date of Service: [name] 10/30/2024andnbsp2:00 PM Medical Record Number: [id number] Patient Account Number: [id number] Date of Birth/Gender: Treating RN: [birth year] (77 y.o. [name] Primary Care Physician: [name] Other Clinician: [name] Referring Physician: Treating Physician/Extender: Chauncey Mann, MICHA EL Mickie Kay in Treatment: 2 Education Assessment Education Provided To: Patient Education Topics Provided Wound/Skin Impairment: Handouts: Other: continue wound care as directed Methods: Explain/Verbal Responses: State content correctly Electronic Signature(s) Signed: 08/09/2023 5:07:07 PM By: [name] Entered By: [name] on 08/09/2023 12:09:33 Robin Searing ([id number]) [id number] Page 9 of 13 -------------------------------------------------------------------------------- Wound Assessment Details Patient Name: Date of Service: BELLAROSE, SIGNORE 08/09/2023 2:00 PM Medical Record Number: [id number] Patient Account Number: [id number] Date of Birth/Sex: Treating RN: Nov 18, 1945 (77 y.o. [name] Primary Care  Jenavive Lamboy:  [name] Other Clinician: [name] Referring Amadea Keagy: Treating Archita Lomeli/Extender: Chauncey Mann, MICHA EL Burt Knack Weeks in Treatment: 2 Wound Status Wound Number: 1 Primary Etiology: Venous Leg Ulcer Wound Location: Right, Medial Lower Leg Wound Status: Healed - Epithelialized Wounding Event: Gradually Appeared Comorbid Chronic Obstructive Pulmonary Disease (COPD), History: Hypertension Date Acquired: 07/26/2023 Weeks Of Treatment: 2 Clustered Wound: No Wound Measurements Length: (cm) Width: (cm) Depth: (cm) Area: (cm) Volume: (cm) 0 % Reduction in Area: 100% 0 % Reduction in Volume: 100% 0 Epithelialization: Large (67-100%) 0 0 Wound Description Classification: Full Thickness Without Exposed Support Exudate Amount: None Present Structures Foul Odor After Cleansing: No Slough/Fibrino No Wound Bed Granulation Amount: None Present (0%) Exposed Structure Necrotic Amount: None Present (0%) Fascia Exposed: No Fat Layer (Subcutaneous Tissue) Exposed: No Tendon Exposed: No Muscle Exposed: No Joint Exposed: No Bone Exposed: No Treatment Notes Wound #1 (Lower Leg) Wound Laterality: Right, Medial Cleanser Peri-Wound Care Topical Primary Dressing Secondary Dressing Secured With Compression Wrap Compression Stockings Add-Ons Electronic Signature(s) Signed: 08/09/2023 5:07:07 PM By: [name] Signed: 08/10/2023 3:34:18 PM By: Yevonne Pax RN Entered By: [name] on 08/09/2023 11:47:57 Robin Searing ([id number]) [id number] Page 10 of 13 -------------------------------------------------------------------------------- Wound Assessment Details Patient Name: Date of Service: JOANNE, OUK 08/09/2023 2:00 PM Medical Record Number: [id number] Patient Account Number: [id number] Date of Birth/Sex: Treating RN: [birth year] (77 y.o. [name] Primary Care Chynah Orihuela: [name] Other Clinician:  [name] Referring Antoine Vandermeulen: Treating Axzel Rockhill/Extender: Chauncey Mann, MICHA EL Burt Knack Weeks in Treatment: 2 Wound Status Wound Number: 2 Primary Etiology: Venous Leg Ulcer Wound Location: Left, Medial Lower Leg Wound Status: Healed - Epithelialized Wounding Event: Gradually Appeared Comorbid Chronic Obstructive Pulmonary Disease (COPD), History: Hypertension Date Acquired: 07/26/2023 Weeks Of Treatment: 2 Clustered Wound: No Wound Measurements Length: (cm) Width: (cm) Depth: (cm) Area: (cm) Volume: (cm) 0 % Reduction in Area: 100% 0 % Reduction in Volume: 100% 0 Epithelialization: Large (67-100%) 0 0 Wound Description Classification: Full Thickness Without Exposed Support Exudate Amount: None Present Structures Foul Odor After Cleansing: No Slough/Fibrino No Wound Bed Granulation Amount: None Present (0%) Exposed Structure Necrotic Amount: None Present (0%) Fascia Exposed: No Fat Layer (Subcutaneous Tissue) Exposed: No Tendon Exposed: No Muscle Exposed: No Joint Exposed: No Bone Exposed: No Treatment Notes Wound #2 (Lower Leg) Wound Laterality: Left, Medial Cleanser Peri-Wound Care Topical Primary Dressing Secondary Dressing Secured With Compression Wrap Compression Stockings Add-Ons Electronic Signature(s) Signed: 08/09/2023 5:07:07 PM By: [name] Signed: 08/10/2023 3:34:18 PM By: Yevonne Pax RN Robin Searing ([id number]) [id number] Page 11 of 13 Entered By: [name] on 08/09/2023 11:48:02 -------------------------------------------------------------------------------- Wound Assessment Details Patient Name: Date of Service: JUSTENE, HINCHCLIFFE 08/09/2023 2:00 PM Medical Record Number: [id number] Patient Account Number: [id number] Date of Birth/Sex: Treating RN: [birth year] (77 y.o. [name] Primary Care Kazimierz Springborn: [name] Other Clinician: [name] Referring  Aamirah Salmi: Treating Wade Sigala/Extender: Chauncey Mann, MICHA EL Burt Knack Weeks in Treatment: 2 Wound Status Wound Number: 3 Primary Etiology: Atypical Wound Location: Left, Lateral Malleolus Wound Status: Healed - Epithelialized Wounding Event: Gradually Appeared Comorbid Chronic Obstructive Pulmonary Disease (COPD), History: Hypertension Date Acquired: 07/26/2023 Weeks Of Treatment: 2 Clustered Wound: No Wound Measurements Length: (cm) Width: (cm) Depth: (cm) Area: (cm) Volume: (cm) 0 % Reduction in Area: 100% 0 % Reduction in Volume: 100% 0 Epithelialization: Small (1-33%) 0 0 Wound Description Classification: Full Thickness Without Exposed Suppor Exudate Amount: Medium Exudate  Type: Sanguinous Exudate Color: red t Structures Wound Bed Granulation Amount: Medium (34-66%) Exposed Structure Granulation Quality: Red Fascia Exposed: No Fat Layer (Subcutaneous Tissue) Exposed: No Tendon Exposed: No Muscle Exposed: No Joint Exposed: No Bone Exposed: No Treatment Notes Wound #3 (Malleolus) Wound Laterality: Left, Lateral Cleanser Peri-Wound Care Topical Primary Dressing Secondary Dressing Secured With Compression Wrap Compression Stockings Add-Ons KIRRA, HEATER ([id number]) [id number] Page 12 of 13 Electronic Signature(s) Signed: 08/09/2023 5:07:07 PM By: [name] Signed: 08/10/2023 3:34:18 PM By: Yevonne Pax RN Entered By: [name] on 08/09/2023 11:48:06 -------------------------------------------------------------------------------- Wound Assessment Details Patient Name: Date of Service: [name] 08/09/2023 2:00 PM Medical Record Number: [id number] Patient Account Number: [id number] Date of Birth/Sex: Treating RN: [birth year] (77 y.o. [name] Primary Care Kaleiah Kutzer: [name] Other Clinician: [name] Referring Rhya Shan: Treating Coleton Woon/Extender: Chauncey Mann, MICHA EL  Burt Knack Weeks in Treatment: 2 Wound Status Wound Number: 4 Primary Skin Tear Etiology: Wound Location: Right, Anterior Forearm Wound Status: Open Wounding Event: Skin Tear/Laceration Comorbid Chronic Obstructive Pulmonary Disease (COPD), Date Acquired: 07/19/2023 History: Hypertension Weeks Of Treatment: 2 Clustered Wound: No Photos Wound Measurements Length: (cm) 5 Width: (cm) 2.5 Depth: (cm) 0.1 Area: (cm) 9.817 Volume: (cm) 0.982 % Reduction in Area: -20.2% % Reduction in Volume: -20.2% Epithelialization: Small (1-33%) Wound Description Classification: Full Thickness Without Exposed Suppor Exudate Amount: Large Exudate Type: Sanguinous Exudate Color: red t Structures Foul Odor After Cleansing: No Slough/Fibrino No Wound Bed Granulation Amount: Medium (34-66%) Exposed Structure Granulation Quality: Red Fascia Exposed: No Fat Layer (Subcutaneous Tissue) Exposed: Yes Tendon Exposed: No Muscle Exposed: No Joint Exposed: No Bone Exposed: No Treatment Notes JALICIA, SCHWARTZENBERGE ([id number]) [id number] Page 13 of 13 Wound #4 (Forearm) Wound Laterality: Right, Anterior Cleanser [employer] Ancillary Kit - 15 Day Supply Discharge Instruction: Use supplies as instructed; Kit contains: (15) Saline Bullets; (15) 3x3 Gauze; 15 pr Gloves Soap and Water Discharge Instruction: Gently cleanse wound with antibacterial soap, rinse and pat dry prior to dressing wounds Peri-Wound Care Topical Triamcinolone Acetonide Cream, 0.1%, 15 (g) tube Discharge Instruction: Apply as directed by Marylu Dudenhoeffer. Primary Dressing Secondary Dressing telfa nonstick pad Secured With Coban Cohesive Bandage 4x5 (yds) Stretched Discharge Instruction: Apply Coban as directed. Compression Wrap Compression Stockings Add-Ons Electronic Signature(s) Signed: 08/09/2023 5:07:07 PM By: [name] Signed: 08/10/2023 3:34:18 PM By: Yevonne Pax RN Entered By: [name]  on 08/09/2023 11:25:44 -------------------------------------------------------------------------------- Vitals Details Patient Name: Date of Service: [name] 08/09/2023 2:00 PM Medical Record Number: [id number] Patient Account Number: [id number] Date of Birth/Sex: Treating RN: 09/18/1946 (77 y.o. [name] Primary Care Ashlynn Gunnels: [name] Other Clinician: [name] Referring Yoseline Andersson: Treating Natavia Sublette/Extender: RO BSO N, MICHA EL Burt Knack Weeks in Treatment: 2 Vital Signs Time Taken: 14:09 Temperature (F): 98.0 Height (in): 63 Pulse (bpm): 81 Weight (lbs): 141.8 Respiratory Rate (breaths/min): 16 Body Mass Index (BMI): 25.1 Blood Pressure (mmHg): 130/75 Reference Range: 80 - 120 mg / dl Electronic Signature(s) Signed: 08/09/2023 5:07:07 PM By: [name] Entered By: [name] on 08/09/2023 11:11:04

## 2023-08-11 NOTE — Progress Notes (Signed)
Remote pacemaker transmission.   

## 2023-08-14 ENCOUNTER — Ambulatory Visit: Payer: Medicare Other | Attending: Family Medicine | Admitting: Physical Therapy

## 2023-08-14 ENCOUNTER — Encounter: Payer: Self-pay | Admitting: Physical Therapy

## 2023-08-14 DIAGNOSIS — R262 Difficulty in walking, not elsewhere classified: Secondary | ICD-10-CM | POA: Insufficient documentation

## 2023-08-14 DIAGNOSIS — M6281 Muscle weakness (generalized): Secondary | ICD-10-CM | POA: Diagnosis present

## 2023-08-14 DIAGNOSIS — R269 Unspecified abnormalities of gait and mobility: Secondary | ICD-10-CM | POA: Diagnosis present

## 2023-08-14 DIAGNOSIS — R2689 Other abnormalities of gait and mobility: Secondary | ICD-10-CM | POA: Diagnosis present

## 2023-08-14 NOTE — Therapy (Signed)
OUTPATIENT PHYSICAL THERAPY NEURO TREATMENT   Patient Name: [name] MRN: [id number] DOB:October 28, 1945, 77 y.o., female Today's Date: 08/14/2023   PCP:   [name], MD   REFERRING PROVIDER:   [name], MD    END OF SESSION:  PT End of Session - 08/14/23 0945     Visit Number 6    Number of Visits 24    Date for PT Re-Evaluation Sep 27, 2023    Progress Note Due on Visit 10    PT Start Time 0931    PT Stop Time 1013    PT Time Calculation (min) 42 min    Equipment Utilized During Treatment Gait belt    Activity Tolerance Patient tolerated treatment well    Behavior During Therapy [facility] for tasks assessed/performed                Past Medical History:  Diagnosis Date   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 Known DISH with thoracic spine fusion and moderate kyphosis, h/o L spine fusion L2-5   Adjustment disorder with depressed mood 10/22/14   81 yo son died MI 09-26-14 Husband died car accident 28-Mar-2015   Anemia    Anticoagulant long-term use 06/14/2019   She takes warfarin for afib/flutter with CHADS2VASc score of 4 (age, sex, CHF, HTN), s/p 2 bioprosthetic valve replacements Coumadin changed to eliquis 2022 per cardiology   Aortic atherosclerosis 03/03/2022   Noted on 01/2022 chest CT   Arthritis    Arthropathy of spinal facet joint 03/08/2018   Ascending aortic aneurysm 03/02/2021   4.1cm on CT 01/2021 rec yearly monitoring   Bilateral hip pain 09/08/2016   Cancer (HCC)    conon cancer 2012   CAP (community acquired pneumonia) 02/08/2016   Carotid stenosis 12/08/2020   Carotid US - 40-59% BICA stenosis (2015) Carotid US 12/2020 - 1-39% BICA stenosis, incidental 3.4cm structure behind L common carotid artery rec further imaging    Cataracts, bilateral     immature   Chest pain 08/25/2014   myoview low risk 02/2018 ([place])   Chronic cholecystitis with calculus 01/21/2015   S/p cholecystectomy    Chronic diastolic heart failure 2014/09/26   Chronic insomnia    Chronic lower back pain 03/05/2008   Returned to Dr Danielle Dess - translaminar ESI at L3/4. Myelogram showed significant left sided foraminal stenosis at L2/3 and L3/4 in setting of solid arthrodesis, planned DEXA and if stable osteopenia, considering anterolateral decompression with spacer.    CKD (chronic kidney disease) stage 2, GFR 60-89 ml/min 07/03/2015   Constipation    takes Miralax daily as needed   COPD (chronic obstructive pulmonary disease)    Albuterol inhaler daily as needed;Duoneb daily as needed;Spiriva daily   COVID-19 virus infection 11/11/2022   DDD (degenerative disc disease)    cervical - kyphosis with mod DD changes C5/6 and C6/7; lumbar - early DD at L2/3 (Elsner)   Depression    DISH (diffuse idiopathic skeletal hyperostosis) 05/14/2019   By MRI noted fusion of lower thoracic vertebrae - likely contributing to unsteadiness (Elsner)   Dyspnea on exertion 06/05/2015   myoview low risk 02/2018   Edema of both lower legs due to peripheral venous insufficiency    Essential hypertension, benign 03/24/2009   hx of not on nmeds currenlty and has been a while since on meds per  pt   Heart murmur    Hematuria 06/17/2021   History of kidney stones    History of radiation therapy 05/30/11 to 07/07/11   rectum   History of rectal cancer 05/26/2011   Rectal cancer 2012, midrectum ypT3ypN0, s/p neoadj chemo&XRT, lap LAR with diverting loop ileostomy 08/2011, ileostomy takedown 03/2012 Discharged from oncology clinic 2017 Truett Perna)   History of shingles    History of vertebral fracture 11/26/2019   Old L1 vertebral compression fracture incidentally noted on imaging 2020    Hyperlipemia    takes Lovastatin daily   Lacunar infarction    Left caudate   Left hip pain  07/29/2020   Legally blind in left eye    Lymphangioma 01/07/2021   Noted incidentally on carotid US 12/2020 CT neck - Likely benign lymphangioma 01/2021   Macrocytosis 03/27/2022   Folate and b12 checked 2023   Malaise and fatigue 01/22/2018   Meralgia paresthetica 12/13/2007   Mixed stress and urge urinary incontinence 03/16/2015   Failed oxybutynin IR/ER. Vesicare initially helped but then no longer helpful.  myrbetriq was not effective.  Saw urology (MacDiarmid) - UDS largely overactive bladder with mild-mod stress incontinence. Improved on abx course - maintained on [place].    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   Pulmonary infiltrates 04/23/2019   06/13/2018-CT super D chest without contrast- generally stable chronic lung disease with pleural parenchymal scarring and scattered nodularity most of these nodules have been tree-in-bud in appearance likely postinfectious or inflammatory the dominant right lower lobe nodule seen on most recent study has  resolved no new or enlarging nodules  04/22/2019-CT chest without contrast- waxing and waning per   Pulmonary nodule 02/23/2012   RLL nodule-47mm stable 2006, April 2009, and June 2009 Small pulm nodules Jan 2012 - > Oct 2012 without change   Rheumatic heart disease mitral stenosis    mod MS by echo 02/2014   Right leg swelling 06/11/2020   R venous US 06/2020 WNL: - No evidence of deep vein thrombosis in the lower extremity. No indirect evidence of obstruction proximal to the inguinal ligament. - No cystic structure found in the popliteal fossa.   S/P aortic and mitral valve bioprostheses 12/08/2013   AVR 21 mm, MVR 25 mm-MagnaEase pericardia   S/P AVR (aortic valve replacement) 2015   bioprosthetic (Bartle)   S/P MVR (mitral valve replacement) 2015   bioprosthetic (Bartle)   Sepsis secondary to UTI (HCC) 08/22/2017   Severe aortic valve stenosis 10/01/2013   mild-mod by echo 02/2014   Skin lesion 01/03/2022   Small bowel obstruction, partial 08/2013  reolved without NGT placement.    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   Dr Amy [place]   Syncope 05/28/2018   Thrombocytopenia 11/04/2019   Typical atrial flutter 05/15/2019   Vitamin B12 deficiency 02/28/2015   Start B12 shots 02/2015    Vitamin D deficiency 10/23/2016   Wounds, multiple    bilateral legs   Past Surgical History:  Procedure Laterality Date   ANTERIOR LAT LUMBAR FUSION Left 07/17/2018   Procedure: Lumbar Two-Three Lumbar Three-Four Anterolateral decompression/interbody fusion with lateral plate fixation/Infuse;  Surgeon: Barnett Abu, MD;  Location: [facility] OR;  Service: Neurosurgery;  Laterality: Left;  Lumbar Two-Three Lumbar Three-Four Anterolateral decompression/interbody fusion with lateral plate fixation/Infuse   AORTIC VALVE REPLACEMENT N/A 12/12/2013   Procedure: AORTIC VALVE REPLACEMENT (AVR);  Surgeon: Alleen Borne, MD; Service: Open Heart Surgery   BACK SURGERY  2006, 2007   2006 SPACER,  2007 decompression and fusion L4/5   BOWEL RESECTION  04/02/2012   Procedure: SMALL BOWEL RESECTION;  Surgeon: [name], MD;  Location: [facility] ORS;  Service: General;  Laterality: N/A;   BRONCHIAL WASHINGS  03/24/2021   Procedure: BRONCHIAL WASHINGS;  Surgeon: [name], MD;  Location: [facility] ENDOS[facility];  Service: Endoscopy;;   CARPAL TUNNEL RELEASE Bilateral    CATARACT EXTRACTION W/ INTRAOCULAR LENS IMPLANT Left 10/18/2016   CATARACT EXTRACTION W/ INTRAOCULAR LENS IMPLANT Right 12/13/2016   Dr. Dagoberto Ligas   CHOLECYSTECTOMY N/A 01/21/2015   chronic cholecystitis, [name], MD   COLON RESECTION  08/25/2011   Procedure: COLON RESECTION LAPAROSCOPIC;  Surgeon: [name], MD;  Location: [facility];  Service: General;  Laterality: N/A;  Laparoscopic Assisted Low Anterior Resection Diverting Ostomy and onQ pain pump   COLONOSCOPY  03/2013   1 polyp, rpt 3 yrs Christella Hartigan)   COLONOSCOPY  05/2021   diverticulosis and patent colon anastomosis with some edema/narrowing, rpt 5 yrs Christella Hartigan)   COLONOSCOPY WITH PROPOFOL N/A 06/09/2016   patent colo-colonic anastomosis, rpt 5 yrs Christella Hartigan)   CYSTOSCOPY/URETEROSCOPY/HOLMIUM LASER/STENT PLACEMENT Right 07/03/2023   Procedure: CYSTOSCOPY RIGHT RETROGRADE PYEPLGRAM RIGHT URETEROSCOPY/HOLMIUM LASER/STENT PLACEMENT;  Surgeon: Crista Elliot, MD;  Location: [facility];  Service: Urology;  Laterality: Right;  1 HR FOR CASE   CYSTOSCOPY/URETEROSCOPY/HOLMIUM LASER/STENT PLACEMENT Right 07/17/2023   Procedure: CYSTOSCOPY/RIGHT URETEROSCOPY/STENT EXCHANGE;  Surgeon: Crista Elliot, MD;  Location: [facility];  Service: Urology;  Laterality: Right;  60 MINS FOR CASE   EP IMPLANTABLE DEVICE N/A 06/16/2016   Procedure: Pacemaker Implant;  Surgeon: Will Jorja Loa, MD;  Location: [facility] IN[facility];  Service: Cardiovascular;  Laterality: N/A;   FOOT SURGERY  left foot   hammer toe   ILEOSTOMY  08/25/2011   ILEOSTOMY CLOSURE  04/02/2012   Procedure: ILEOSTOMY  TAKEDOWN;  Surgeon: [name], MD;  Location: [facility];  Service: General;  Laterality: N/A;   INTRAOPERATIVE TRANSESOPHAGEAL ECHOCARDIOGRAM N/A 12/12/2013   Procedure: INTRAOPERATIVE TRANSESOPHAGEAL ECHOCARDIOGRAM;  Surgeon: Alleen Borne, MD;  Location: [facility];  Service: Open Heart Surgery;  Laterality: N/A;   LEFT AND RIGHT HEART CATHETERIZATION WITH CORONARY ANGIOGRAM N/A 11/27/2013   Procedure: LEFT AND RIGHT HEART CATHETERIZATION WITH CORONARY ANGIOGRAM;  Surgeon: Thurmon Fair, MD;  Location: [facility] CATH [facility];  Service: Cardiovascular;  Laterality: N/A;   MITRAL VALVE REPLACEMENT N/A 12/12/2013   Procedure: MITRAL VALVE (MV) REPLACEMENT OR REPAIR;  Surgeon: Alleen Borne, MD; Service: Open Heart Surgery   TEE WITHOUT CARDIOVERSION N/A 11/29/2013   Procedure: TRANSESOPHAGEAL ECHOCARDIOGRAM (TEE);  Surgeon: Quintella Reichert, MD;  Location[facility] END[facility];  Service: Cardiovascular;  Laterality: N/A;   TEE WITHOUT CARDIOVERSION N/A 06/04/2019   Procedure: TRANSESOPHAGEAL ECHOCARDIOGRAM (TEE);  Surgeon: Thurmon Fair, MD;  Location: [facility] ENDOSCOPY;  Service: Cardiovascular;  Laterality: N/A;   TONSILLECTOMY     VIDEO BRONCHOSCOPY N/A 03/24/2021   Procedure: VIDEO BRONCHOSCOPY WITHOUT FLUORO;  Surgeon: [name], MD;  Location: [facility];  Service: Endoscopy;  Laterality: N/A;  SMALL SCOPE,PREMEDICATE WITH NEBULIZED LIDOCAINE   Patient Active Problem List   Diagnosis Date Noted   Acquired lymphocytopenia 08/02/2023   Pressure sore on heel 06/15/2023   Lump of skin 06/06/2023   Arterial leg ulcer (HCC) 06/06/2023   BRBPR (bright red blood per rectum) 04/24/2023   Head injury 04/24/2023   Cause of injury, fall 03/13/2023   Imbalance 03/13/2023   Venous ulcer of leg (HCC) 01/24/2023   Fatigue 10/05/2022   Positive ANA (antinuclear antibody) 05/30/2022   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   Lacunar infarction (HCC)    Macrocytosis 03/27/2022   Aortic atherosclerosis 03/03/2022    Peripheral neuropathy 01/03/2022   Hematuria 06/17/2021   Pulmonary artery hypertension 06/17/2021   Pacemaker 03/02/2021   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   Ascending aortic aneurysm (HCC) 03/02/2021   Lymphangioma 01/07/2021   Persistent atrial fibrillation 12/09/2020   Carotid stenosis 12/08/2020   Left hip pain 07/29/2020   Right leg swelling 06/11/2020   History of vertebral fracture 11/26/2019   Acute midline low back pain without sciatica 11/26/2019   Thrombocytopenia 11/04/2019   Anticoagulant long-term use 06/14/2019   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   Typical atrial flutter 05/15/2019   DISH (diffuse idiopathic skeletal hyperostosis) 05/14/2019   Pulmonary infiltrates 04/23/2019   Other spondylosis with radiculopathy, lumbar region 07/17/2018   Syncope 05/28/2018   Arthropathy of spinal facet joint 03/08/2018   Malaise and fatigue 01/22/2018   Pain of right sacroiliac joint 08/09/2017   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 12/31/2016   Vitamin D deficiency 10/23/2016   Bilateral hip pain 09/08/2016   History of colonic polyps    Pedal edema 02/25/2016   Advanced care planning/counseling discussion 10/28/2015   CKD (chronic kidney disease) stage 2, GFR 60-89 ml/min 07/03/2015   Dyspnea on exertion 06/05/2015   Mixed stress and urge urinary incontinence 03/16/2015   Vitamin B12 deficiency 02/28/2015   Chronic insomnia 02/25/2015   Osteoporosis 12/09/2014   Medicare annual wellness visit, subsequent 10/15/2014   Health maintenance examination 10/15/2014   Adjustment disorder with mixed anxiety and depressed mood 10/15/2014   Chronic diastolic heart failure 09/19/2014   Chest pain 08/25/2014   S/P aortic and mitral valve bioprostheses 12/2013   Pulmonary nodules 02/23/2012   History of radiation therapy    Macrocytic anemia 09/21/2011   History of rectal cancer 05/26/2011   Essential hypertension, benign 03/24/2009   Overweight (BMI  25.0-29.9) 03/05/2008   Chronic lower back pain 03/05/2008   Meralgia paresthetica 12/13/2007   GERD (gastroesophageal reflux disease) 08/06/2007   HLD (hyperlipidemia) 05/03/2007   COPD (chronic obstructive pulmonary disease) 05/03/2007    ONSET DATE: 06/10/22  REFERRING DIAG:  M54.50,G89.29 (ICD-10-CM) - Chronic midline low back pain without sciatica  R26.89 (ICD-10-CM) - Imbalance    THERAPY DIAG:  Abnormality of gait and mobility  Difficulty in walking, not elsewhere classified  Muscle weakness (generalized)  Other abnormalities of gait and mobility  Rationale for Evaluation and Treatment: Rehabilitation  SUBJECTIVE:  SUBJECTIVE STATEMENT: Pt reports doing well today. Pt denies any recent falls/stumbles since prior session. Pt denies any updates to medications or medical appointment since prior session. Pt reports good compliance with HEP when time permits.    Pt accompanied by: self  PERTINENT HISTORY: Patient has a long history of low back pain, arthritis, COPD, hypertension, compression fracture, hyperlipidemia, visual deficits in the left eye  From Eval: Pt reports with her balance she is falling in scenarios where she begins to feel off balance then she falls. She had a fall when putting out round up and other loss of balance when she was repotting a plant and lost her balance.  Both of her fall she noted were in flexed posture for prolonged period likely resulting in inability to perform adequate extension to get out of this posture resulting in fall.  Pt also has low back pain from " disc fragments" that are in her back. The pain worsens with longer standing. Pt low back pain for years. She cannot go into extension and cannot lay on her stomach.  PAIN:  Are you having pain? Yes: NPRS  scale: 5/10 Pain location: low back across both sides.  Pain description: Dull, aching Aggravating factors: standing, walking Relieving factors: sitting ( flexed postures)  PRECAUTIONS: Fall  RED FLAGS: None   WEIGHT BEARING RESTRICTIONS: No  FALLS: Has patient fallen in last 6 months? Yes. Number of falls 2  LIVING ENVIRONMENT: Lives with: lives with their family Lives in: House/apartment Stairs: Yes: Internal: 3 steps; can reach both Has following equipment at home: Single point cane, quad cane  PLOF: Independent with basic ADLs  PATIENT GOALS: Improve balance, decrease falls, improve her back pain   OBJECTIVE:   DIAGNOSTIC FINDINGS: From MRI of lumbar spine in June 2024 interpreted by radiologist and impression as below  IMPRESSION: 1. No acute fracture or acute finding. 2. Chronic compression fracture of L1 stable from the prior lumbar spine CT. 3. Postoperative changes from fusions, L2-L3, L3-L4 and L4-L5, also stable from the prior C  COGNITION: Overall cognitive status: Within functional limits for tasks assessed   SENSATION: Not tested   POSTURE: rounded shoulders, decreased lumbar lordosis, increased thoracic kyphosis, and flexed trunk   LOWER EXTREMITY ROM:     MMT Right Eval Left Eval  Hip flexion 3+ 3+  Hip extension    Hip abduction 4- 4-  Hip adduction 4 4  Hip internal rotation    Hip external rotation    Knee flexion 3+ 3+  Knee extension 4   Ankle dorsiflexion 4+ 4+  Ankle plantarflexion    Ankle inversion    Ankle eversion     (Blank rows = not tested)   (Blank rows = not tested)  BED MOBILITY:  Pt does not sleep in the bed, currently sleeps in recliner  TRANSFERS: Assistive device utilized: Single point cane  Sit to stand: Modified independence Stand to sit: Modified independence Chair to chair: Modified independence Floor:  does not attempt   STAIRS: Level of Assistance: SBA Stair Negotiation Technique: Alternating  Pattern  with Bilateral Rails Number of Stairs: 4  Height of Stairs: 6 in  Comments:   GAIT: Gait pattern: decreased arm swing- Right, decreased arm swing- Left, decreased step length- Right, decreased step length- Left, decreased stance time- Right, decreased stance time- Left, and trunk flexed Distance walked: 450 ft Assistive device utilized: Walker - 4 wheeled Level of assistance: Modified independence Comments: flexed posture improved with use of rollator  but UE fatigued with use of rollator.   FUNCTIONAL TESTS:  5 times sit to stand: 17 sec with B UE, cannot complete 1 without UE assist   Timed up and go (TUG): 14 sec, UE assist   6 minute walk test: 450 ft with clinic rollator, did nit complete 6 minutes limited by back pain  Dynamic Gait Index: 10 14.82 sec no AD      PATIENT SURVEYS:  FOTO 47 RAG of 53  TODAY'S TREATMENT:        TE  Ambulation 3*160 ft feet with rollator and 3# AW - cues for proper posture during ambulation required  Standing Rows BTB 2x10 Seated LAQ with 3# AW 2 x 10  Standing hip abduction with 3# AW 1*10 ea LE  Standing march 1 x 10 with 3# AW  Seated HS curls 3 x 10 ea LE with GTB Standing Shoulder extensions BTB 2x10  Squat to 2KG ball on 24 inch step  x 10 reps, cues for form with knee and hip hinge.   Ambulation with rollator to elevator and parking lot to end session   Not billed: PT assisted with moving pt rollator to her trunk for easier access ( pt said she could not remove form her back seat and needed help, hoping to enable more mobility with her walking in community)    Pt required occasional rest breaks due fatigue, PT was quick to ask when pt appeared to be fatiguing in order to prevent excessive fatigue.     PATIENT EDUCATION: Education details: POC Person educated: Patient Education method: Explanation Education comprehension: verbalized understanding  HOME EXERCISE PROGRAM: Establish visit 2   GOALS: Goals  reviewed with patient? Yes  SHORT TERM GOALS: Target date: 07/27/2023    Patient will be independent in home exercise program to improve strength/mobility for better functional independence with ADLs. Baseline: No HEP currently  Goal status: INITIAL   LONG TERM GOALS: Target date: 09/21/2023   1.  Patient (> 45 years old) will complete five times sit to stand test in < 15 seconds indicating an increased LE strength and improved balance. Baseline: 17 sec with UE assist  Goal status: INITIAL  2.  Patient will increase FOTO score to equal to or greater than  53   to demonstrate statistically significant improvement in mobility and quality of life.  Baseline: 47 Goal status: INITIAL   3.  Patient will increase DGI Balance score by > 6 points to demonstrate decreased fall risk during functional activities. Baseline: 10 Goal status: INITIAL   4.   Patient will reduce timed up and go to <11 seconds to reduce fall risk and demonstrate improved transfer/gait ability. Baseline: 14 sec, [place] use for STS  Goal status: INITIAL  5.   Patient will increase 10 meter walk test to >1.32m/s as to improve gait speed for better community ambulation and to reduce fall risk. Baseline: .675 m/s no AD  Goal status: INITIAL  6.   Patient will increase six minute walk test distance to >800 without increase in back pain for progression to community ambulator and improve gait ability Baseline: 450 ft with clinic rollator to offset low back pain  Goal status: INITIAL    ASSESSMENT:  CLINICAL IMPRESSION:  Pt responded well to treatment today. Pt reported no increase in pain throughout today's session and was able to correct posture during ambulation with verbal cues. Pt was compliant and motivated to complete all tasks during today's session. Pt  able to finish session by walking to parking lot with rollator compared to using a transport chair to get to clinic initially today. Pt will continue to benefit  from skilled physical therapy intervention to address impairments, improve QOL, and attain therapy goals.    OBJECTIVE IMPAIRMENTS: Abnormal gait, decreased activity tolerance, decreased balance, decreased endurance, decreased mobility, difficulty walking, decreased ROM, decreased strength, hypomobility, and pain.   ACTIVITY LIMITATIONS: carrying, lifting, standing, sleeping, stairs, transfers, and locomotion level  PARTICIPATION LIMITATIONS: driving, community activity, and yard work  PERSONAL FACTORS: Age, Fitness, Time since onset of injury/illness/exacerbation, and 3+ comorbidities:  arthritis, COPD, hypertension, compression fracture, hyperlipidemia, visual deficits in the left eye  are also affecting patient's functional outcome.   REHAB POTENTIAL: Good  CLINICAL DECISION MAKING: Stable/uncomplicated  EVALUATION COMPLEXITY: Low  PLAN:  PT FREQUENCY: 2x/week  PT DURATION: 12 weeks  PLANNED INTERVENTIONS: Therapeutic exercises, Therapeutic activity, Neuromuscular re-education, Balance training, Gait training, Patient/Family education, Self Care, Joint mobilization, Joint manipulation, Stair training, Dry Needling, and Manual therapy  PLAN FOR NEXT SESSION: HEP, balance, UE strength for walker use, postural strengthening ( post chain), balance   Norman Herrlich PT ,DPT Physical Therapist- [place]  [facility]

## 2023-08-17 ENCOUNTER — Inpatient Hospital Stay: Payer: Medicare Other

## 2023-08-17 ENCOUNTER — Inpatient Hospital Stay: Payer: Medicare Other | Attending: Oncology | Admitting: Oncology

## 2023-08-17 VITALS — BP 125/69 | HR 105 | Temp 98.2°F | Resp 18 | Ht 64.0 in | Wt 152.0 lb

## 2023-08-17 DIAGNOSIS — D539 Nutritional anemia, unspecified: Secondary | ICD-10-CM

## 2023-08-17 DIAGNOSIS — D696 Thrombocytopenia, unspecified: Secondary | ICD-10-CM | POA: Diagnosis not present

## 2023-08-17 DIAGNOSIS — D649 Anemia, unspecified: Secondary | ICD-10-CM | POA: Diagnosis present

## 2023-08-17 DIAGNOSIS — Z85048 Personal history of other malignant neoplasm of rectum, rectosigmoid junction, and anus: Secondary | ICD-10-CM | POA: Diagnosis not present

## 2023-08-17 LAB — RETICULOCYTES
Immature Retic Fract: 9.9 % (ref 2.3–15.9)
RBC.: 3.33 MIL/uL — ABNORMAL LOW (ref 3.87–5.11)
Retic Count, Absolute: 45 10*3/uL (ref 19.0–186.0)
Retic Ct Pct: 1.4 % (ref 0.4–3.1)

## 2023-08-17 LAB — CBC WITH DIFFERENTIAL (CANCER CENTER ONLY)
Abs Immature Granulocytes: 0.03 10*3/uL (ref 0.00–0.07)
Basophils Absolute: 0 10*3/uL (ref 0.0–0.1)
Basophils Relative: 1 %
Eosinophils Absolute: 0.2 10*3/uL (ref 0.0–0.5)
Eosinophils Relative: 4 %
HCT: 34.1 % — ABNORMAL LOW (ref 36.0–46.0)
Hemoglobin: 10.7 g/dL — ABNORMAL LOW (ref 12.0–15.0)
Immature Granulocytes: 1 %
Lymphocytes Relative: 5 %
Lymphs Abs: 0.3 10*3/uL — ABNORMAL LOW (ref 0.7–4.0)
MCH: 31.8 pg (ref 26.0–34.0)
MCHC: 31.4 g/dL (ref 30.0–36.0)
MCV: 101.5 fL — ABNORMAL HIGH (ref 80.0–100.0)
Monocytes Absolute: 0.8 10*3/uL (ref 0.1–1.0)
Monocytes Relative: 13 %
Neutro Abs: 4.6 10*3/uL (ref 1.7–7.7)
Neutrophils Relative %: 76 %
Platelet Count: 79 10*3/uL — ABNORMAL LOW (ref 150–400)
RBC: 3.36 MIL/uL — ABNORMAL LOW (ref 3.87–5.11)
RDW: 14.4 % (ref 11.5–15.5)
WBC Count: 6 10*3/uL (ref 4.0–10.5)
nRBC: 0 % (ref 0.0–0.2)

## 2023-08-17 LAB — SAVE SMEAR(SSMR), FOR PROVIDER SLIDE REVIEW

## 2023-08-17 NOTE — Progress Notes (Signed)
[facility] New Patient Consult   Requesting MD: Diane Boyden, Md [address] [place],  [place] [address]   [name] 77 y.o.  [birth year]    Reason for Consult: Anemia, thrombocytopenia   HPI: [name] is followed by Dr. Sharen [name] for primary care.  She has been noted to have macrocytic anemia and thrombocytopenia for the past several months.  CBC on 06/21/2023 found the hemoglobin 11.3, platelets 81,000, MCV 102.6, WBC 6.1, ANC 5.0, and absolute lymphocyte count 0.4.  Repeat CBC 08/01/2023 found the hemoglobin 12.2, MCV 0.7, and platelets 110,000.  The absolute for site count returned at 0.5 with an ANC of 4.9.  Review of the electronic medical record reveals Red cell macrocytosis dating back to at least 2019 and intermittent mild thrombocytopenia in 2016. She was found to have a Hemoccult positive stool 07/03/2023.  She has been referred to gastroenterology.  She denies bleeding.      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 Known DISH with thoracic spine fusion and moderate kyphosis, h/o L spine fusion L2-5   Adjustment disorder with depressed mood 02-Nov-2014   27 yo son died MI 2014-10-07 Husband died car accident 2015-04-08   Anemia    Anticoagulant long-term use 06/14/2019   She takes warfarin for afib/flutter with CHADS2VASc score of 4 (age, sex, CHF, HTN), s/p 2 bioprosthetic valve replacements Coumadin changed to eliquis 2022 per cardiology   Aortic atherosclerosis 03/03/2022   Noted on 01/2022 chest CT   Arthritis    Arthropathy of spinal facet joint 03/08/2018   Ascending aortic aneurysm 03/02/2021   4.1cm on CT 01/2021 rec yearly monitoring   Bilateral hip pain 09/08/2016   Cancer (HCC)    conon cancer 2012    CAP (community acquired pneumonia) 02/08/2016   Carotid stenosis 12/08/2020   Carotid US - 40-59% BICA stenosis (2015) Carotid US 12/2020 - 1-39% BICA stenosis, incidental 3.4cm structure behind L common carotid artery rec further imaging    Cataracts, bilateral    immature   Chest pain 08/25/2014   myoview low risk 02/2018 ([name])   Chronic cholecystitis with calculus 01/21/2015   S/p cholecystectomy    Chronic diastolic heart failure 10/07/14   Chronic insomnia    Chronic lower back pain 03/05/2008   Returned to Dr [name] - translaminar ESI at L3/4. Myelogram showed significant left sided foraminal stenosis at L2/3 and L3/4 in setting of solid arthrodesis, planned DEXA and if stable osteopenia, considering anterolateral decompression with spacer.    CKD (chronic kidney disease) stage 2, GFR 60-89 ml/min 07/03/2015   Constipation    takes Miralax daily as needed   COPD (chronic obstructive pulmonary disease)    Albuterol inhaler daily as needed;Duoneb daily as needed;Spiriva daily   COVID-19 virus infection 11/11/2022   DDD (degenerative disc disease)    cervical - kyphosis with mod DD changes C5/6 and C6/7; lumbar - early DD at L2/3 ([name])   Depression    DISH (diffuse idiopathic skeletal hyperostosis) 05/14/2019   By MRI noted fusion of lower thoracic vertebrae - likely contributing to unsteadiness ([name])   Dyspnea on exertion 06/05/2015   myoview low risk 02/2018   Edema of both lower legs due to peripheral venous insufficiency    Essential hypertension, benign 03/24/2009  hx of not on nmeds currenlty and has been a while since on meds per pt   Heart murmur    Hematuria 06/17/2021   History of kidney stones    History of radiation therapy 05/30/11 to 07/07/11   rectum   History of rectal cancer 05/26/2011   Rectal cancer 2012, midrectum ypT3ypN0, s/p neoadj chemo&XRT, lap LAR with diverting loop ileostomy 08/2011, ileostomy takedown 03/2012 Discharged from oncology clinic  2017 [name])   History of shingles    History of vertebral fracture 11/26/2019   Old L1 vertebral compression fracture incidentally noted on imaging 2020    Hyperlipemia    takes Lovastatin daily   Lacunar infarction    Left caudate   Left hip pain 07/29/2020   Legally blind in left eye    Lymphangioma 01/07/2021   Noted incidentally on carotid US 12/2020 CT neck - Likely benign lymphangioma 01/2021   Macrocytosis 03/27/2022   Folate and b12 checked 2023   Malaise and fatigue 01/22/2018   Meralgia paresthetica 12/13/2007   Mixed stress and urge urinary incontinence 03/16/2015   Failed oxybutynin IR/ER. Vesicare initially helped but then no longer helpful.  myrbetriq was not effective.  Saw urology ([name]) - UDS largely overactive bladder with mild-mod stress incontinence. Improved on abx course - maintained on [name].    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   Pulmonary infiltrates 04/23/2019   06/13/2018-CT super D chest without contrast-  generally stable chronic lung disease with pleural parenchymal scarring and scattered nodularity most of these nodules have been tree-in-bud in appearance likely postinfectious or inflammatory the dominant right lower lobe nodule seen on most recent study has resolved no new or enlarging nodules  04/22/2019-CT chest without contrast- waxing and waning per   Pulmonary nodule 02/23/2012   RLL nodule-46mm stable 2006, April 2009, and June 2009 Small pulm nodules Jan 2012 - > Oct 2012 without change   Rheumatic heart disease mitral stenosis    mod MS by echo 02/2014   Right leg swelling 06/11/2020   R venous US 06/2020 WNL: - No evidence of deep vein thrombosis in the lower extremity. No indirect evidence of obstruction proximal to the inguinal ligament. - No cystic structure found in the popliteal fossa.   S/P aortic and mitral valve bioprostheses 12/08/2013   AVR 21 mm, MVR 25 mm-MagnaEase pericardia   S/P AVR (aortic valve replacement) 2015   bioprosthetic (Bartle)   S/P MVR (mitral valve replacement) 2015   bioprosthetic (Bartle)   Sepsis secondary to UTI (HCC) 08/22/2017   Severe aortic valve stenosis 10/01/2013   mild-mod by echo  02/2014   Skin lesion 01/03/2022   Small bowel obstruction, partial 08/2013   reolved without NGT placement.    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  G2, P2  Past Surgical History:  Procedure Laterality Date   ANTERIOR LAT LUMBAR FUSION Left 07/17/2018   Procedure: Lumbar Two-Three Lumbar Three-Four Anterolateral decompression/interbody fusion with lateral plate fixation/Infuse;  Surgeon: Barnett Abu, MD;  Location: [facility] OR;  Service: Neurosurgery;  Laterality: Left;  Lumbar Two-Three Lumbar Three-Four  Anterolateral decompression/interbody fusion with lateral plate fixation/Infuse   AORTIC VALVE REPLACEMENT N/A 12/12/2013   Procedure: AORTIC VALVE REPLACEMENT (AVR);  Surgeon: Alleen Borne, MD; Service: Open Heart Surgery   BACK SURGERY  2006, 2007   2006 SPACER, 2007 decompression and fusion L4/5   BOWEL RESECTION  04/02/2012   Procedure: SMALL BOWEL RESECTION;  Surgeon: [name], MD;  Location: [facility] ORS;  Service: General;  Laterality: N/A;   BRONCHIAL WASHINGS  03/24/2021   Procedure: BRONCHIAL WASHINGS;  Surgeon: [name], MD;  Location: [facility] END[facility];  Service: Endoscopy;;   CARPAL TUNNEL RELEASE Bilateral    CATARACT EXTRACTION W/ INTRAOCULAR LENS IMPLANT Left 10/18/2016   CATARACT EXTRACTION W/ INTRAOCULAR LENS IMPLANT Right 12/13/2016   Dr. Dagoberto Ligas   CHOLECYSTECTOMY N/A 01/21/2015   chronic cholecystitis, [name], MD   COLON RESECTION  08/25/2011   Procedure: COLON RESECTION LAPAROSCOPIC;  Surgeon: [name], MD;  Location: [facility];  Service: General;  Laterality: N/A;  Laparoscopic Assisted Low Anterior Resection Diverting Ostomy and onQ pain pump   COLONOSCOPY  03/2013   1 polyp, rpt 3 yrs Christella Hartigan)   COLONOSCOPY  05/2021   diverticulosis and patent colon anastomosis with some edema/narrowing, rpt 5 yrs Christella Hartigan)   COLONOSCOPY WITH PROPOFOL N/A 06/09/2016   patent colo-colonic anastomosis, rpt 5 yrs Christella Hartigan)   CYSTOSCOPY/URETEROSCOPY/HOLMIUM LASER/STENT PLACEMENT Right 07/03/2023   Procedure: CYSTOSCOPY RIGHT RETROGRADE PYEPLGRAM RIGHT URETEROSCOPY/HOLMIUM LASER/STENT PLACEMENT;  Surgeon: Crista Elliot, MD;  Location: [facility];  Service: Urology;  Laterality: Right;  1 HR FOR CASE   CYSTOSCOPY/URETEROSCOPY/HOLMIUM LASER/STENT PLACEMENT Right 07/17/2023   Procedure: CYSTOSCOPY/RIGHT URETEROSCOPY/STENT EXCHANGE;  Surgeon: Crista Elliot, MD;  Location: [facility];  Service: Urology;  Laterality: Right;  60 MINS FOR CASE   EP IMPLANTABLE DEVICE N/A 06/16/2016    Procedure: Pacemaker Implant;  Surgeon: Will Jorja Loa, MD;  Location: [facility];  Service: Cardiovascular;  Laterality: N/A;   FOOT SURGERY  left foot   hammer toe   ILEOSTOMY  08/25/2011   ILEOSTOMY CLOSURE  04/02/2012   Procedure: ILEOSTOMY TAKEDOWN;  Surgeon: [name], MD;  Location: [facility];  Service: General;  Laterality: N/A;   INTRAOPERATIVE TRANSESOPHAGEAL ECHOCARDIOGRAM N/A 12/12/2013   Procedure: INTRAOPERATIVE TRANSESOPHAGEAL ECHOCARDIOGRAM;  Surgeon: Alleen Borne, MD;  Location: [facility];  Service: Open Heart Surgery;  Laterality: N/A;   LEFT AND RIGHT HEART CATHETERIZATION WITH CORONARY ANGIOGRAM N/A 11/27/2013   Procedure: LEFT AND RIGHT HEART CATHETERIZATION WITH CORONARY ANGIOGRAM;  Surgeon: Thurmon Fair, MD;  Location: [facility] CATH [facility];  Service: Cardiovascular;  Laterality: N/A;   MITRAL VALVE REPLACEMENT N/A 12/12/2013   Procedure: MITRAL VALVE (MV) REPLACEMENT OR REPAIR;  Surgeon:  Alleen Borne, MD; Service: Open Heart Surgery   TEE WITHOUT CARDIOVERSION N/A 11/29/2013   Procedure: TRANSESOPHAGEAL ECHOCARDIOGRAM (TEE);  Surgeon: Quintella Reichert, MD;  Location: [facility] ENDOSCOPY;  Service: Cardiovascular;  Laterality: N/A;   TEE WITHOUT CARDIOVERSION N/A 06/04/2019   Procedure: TRANSESOPHAGEAL ECHOCARDIOGRAM (TEE);  Surgeon: Thurmon Fair, MD;  Location: [facility] ENDOSCOPY;  Service: Cardiovascular;  Laterality: N/A;   TONSILLECTOMY     VIDEO BRONCHOSCOPY N/A 03/24/2021   Procedure: VIDEO BRONCHOSCOPY WITHOUT FLUORO;  Surgeon: [name], MD;  Location: [facility] END[facility];  Service: Endoscopy;  Laterality: N/A;  SMALL SCOPE,PREMEDICATE WITH NEBULIZED LIDOCAINE    Medications: Reviewed  Allergies:  Allergies  Allergen Reactions   Codeine Nausea Only   Prednisone Nausea Only and Other (See Comments)    In different places all over the body.   Sulfa Antibiotics Nausea Only    Family history: Father had lung cancer and was a smoker  Social History:   She  lives alone in [place].  She worked as a Product manager.  She quit smoking cigarettes in 1976.  Rare alcohol use.  No risk factor for HIV or hepatitis.  ROS:   Positives include: Malaise, back pain when standing, occasional nausea after taking morning medications, urinary incontinence, soft stool, intermittent finger numbness when working at the computer, easy bruising  A complete ROS was otherwise negative.  Physical Exam:  Blood pressure 125/69, pulse (!) 105, temperature 98.2 F (36.8 C), temperature source Temporal, resp. rate 18, height 5\' 4"  (1.626 m), weight 152 lb (68.9 kg), SpO2 94%.  HEENT: Oropharynx without visible mass Lungs: Clear bilaterally, no respiratory distress Cardiac: Regular rate and rhythm, 2/6 systolic murmur Abdomen: No hepatosplenomegaly, no mass, nontender  Vascular: No leg edema Lymph nodes: No cervical, supraclavicular, axillary, or inguinal nodes Neurologic: Alert and oriented, the motor exam appears intact in the upper and lower extremities bilaterally Skin: Hyperpigmentation and superficial ulcerations at the lower leg bilaterally Musculoskeletal: No spine tenderness   LAB:  CBC  Lab Results  Component Value Date   WBC 6.0 08/17/2023   HGB 10.7 (L) 08/17/2023   HCT 34.1 (L) 08/17/2023   MCV 101.5 (H) 08/17/2023   PLT 79 (L) 08/17/2023   NEUTROABS 4.6 08/17/2023    Blood smear: Variation in red cell size, the polychromasia is not increased.  There are a few macrocytes, teardrops, target cells, and ovalocytes.  No nucleated red cells.  The majority the white cells are mature neutrophils.  No blasts or other young forms are seen.  No monotonous white cell population.  The plates appear decreased in number.  No platelet clumps.  Majority the platelets are small.   CMP  Lab Results  Component Value Date   NA 139 07/17/2023   K 4.0 07/17/2023   CL 103 07/17/2023   CO2 26 07/17/2023   GLUCOSE 88 07/17/2023   BUN 19 07/17/2023   CREATININE  0.98 07/17/2023   CALCIUM 9.1 07/17/2023   PROT 6.2 06/21/2023   ALBUMIN 3.4 (L) 06/21/2023   AST 28 06/21/2023   ALT 11 06/21/2023   ALKPHOS 65 06/21/2023   BILITOT 0.8 06/21/2023   GFRNONAA 59 (L) 07/17/2023   GFRAA 82 05/31/2019     Lab Results  Component Value Date   CEA1 <1.00 08/12/2016   CEA1 1.0 08/12/2016       Assessment/Plan:   Macrocytic anemia  Thrombocytopenia  3.  COPD 4.  Aortic valve replacement 2015 5.  Mitral valve replacement 2015 6.  Kidney  stone 2024 7.  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  Arrhythmia, pacemaker in place 9.  Chronic venous stasis with lower extremity ulcers   Disposition:   Mr. Nichelson is referred for evaluation of macrocytic anemia and thrombocytopenia.  Review of the medical record indicates chronic Red cell macrocytosis and intermittent mild thrombocytopenia.  The hemoglobin and platelet count have been lower over the past few months.  Review of the peripheral blood smear reveals no obvious explanation for the hematologic findings.  I discussed the differential diagnosis with Mr. Hendriks.  This includes myelodysplasia, a hematopoietic malignancy, and chronic liver disease.  I cannot relate the hematologic findings to her medical regimen.  She does not have a history of liver disease and no liver abnormality has been reported on abdomen CTs.  The pretibial discoloration and chronic "stasis "raises a concern for liver disease.  The hemoglobin appears lower over the past several months.  We we will obtain a myeloma panel and hemolysis evaluation.  I recommend proceeding with a diagnostic bone marrow biopsy.  She is in agreement.  She will return for an office visit after the bone marrow biopsy.  Thornton Papas, MD  08/17/2023, 2:58 PM

## 2023-08-18 LAB — KAPPA/LAMBDA LIGHT CHAINS
Kappa free light chain: 82.4 mg/L — ABNORMAL HIGH (ref 3.3–19.4)
Kappa, lambda light chain ratio: 2.14 — ABNORMAL HIGH (ref 0.26–1.65)
Lambda free light chains: 38.5 mg/L — ABNORMAL HIGH (ref 5.7–26.3)

## 2023-08-21 ENCOUNTER — Other Ambulatory Visit: Payer: Self-pay | Admitting: Radiology

## 2023-08-21 ENCOUNTER — Ambulatory Visit: Payer: Medicare Other | Admitting: Physical Therapy

## 2023-08-21 ENCOUNTER — Encounter: Payer: Self-pay | Admitting: Physical Therapy

## 2023-08-21 ENCOUNTER — Telehealth: Payer: Self-pay | Admitting: *Deleted

## 2023-08-21 DIAGNOSIS — R269 Unspecified abnormalities of gait and mobility: Secondary | ICD-10-CM | POA: Diagnosis not present

## 2023-08-21 DIAGNOSIS — M6281 Muscle weakness (generalized): Secondary | ICD-10-CM

## 2023-08-21 DIAGNOSIS — D539 Nutritional anemia, unspecified: Secondary | ICD-10-CM

## 2023-08-21 DIAGNOSIS — R2689 Other abnormalities of gait and mobility: Secondary | ICD-10-CM

## 2023-08-21 DIAGNOSIS — R262 Difficulty in walking, not elsewhere classified: Secondary | ICD-10-CM
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
## 2023-08-21 NOTE — Telephone Encounter (Signed)
Patient called to report she has a "bad back" and the only way she can tolerate a bone marrow biopsy is if she is "knocked out". Forwarded this information to CT biopsy scheduler to reach out to staff to call patient to discuss.

## 2023-08-22 ENCOUNTER — Other Ambulatory Visit: Payer: Self-pay | Admitting: Radiology

## 2023-08-22 LAB — MULTIPLE MYELOMA PANEL, SERUM
Albumin SerPl Elph-Mcnc: 3.1 g/dL (ref 2.9–4.4)
Albumin/Glob SerPl: 1.1 (ref 0.7–1.7)
Alpha 1: 0.3 g/dL (ref 0.0–0.4)
Alpha2 Glob SerPl Elph-Mcnc: 0.7 g/dL (ref 0.4–1.0)
B-Globulin SerPl Elph-Mcnc: 1.3 g/dL (ref 0.7–1.3)
Gamma Glob SerPl Elph-Mcnc: 0.7 g/dL (ref 0.4–1.8)
Globulin, Total: 3 g/dL (ref 2.2–3.9)
IgA: 617 mg/dL — ABNORMAL HIGH (ref 64–422)
IgG (Immunoglobin G), Serum: 938 mg/dL (ref 586–1602)
IgM (Immunoglobulin M), Srm: 91 mg/dL (ref 26–217)
Total Protein ELP: 6.1 g/dL (ref 6.0–8.5)

## 2023-08-22 NOTE — Consult Note (Signed)
Chief Complaint: Patient was seen in consultation today for  CT guided bone marrow biopsy  Referring Physician(s): Sherrill,Gary B  Supervising Physician: Simonne Come  Patient Status: [facility] - Out-pt  History of Present Illness: [name] is a 76 y.o. female with PMH sig for chronic low back pain, depression, arthritis, ascending aortic aneurysm, carotid stenosis, heart failure, chronic kidney disease, COPD, degenerative disc disease, hypertension, nephrolithiasis, rectal cancer, legally blind left eye, A-fib, pacemaker, aortic and mitral valve bioprostheses, chronic venous stasis with lower extremity ulcers who presents now with chronic red cell macrocytosis and intermittent mild thrombocytopenia of uncertain etiology.  She is scheduled today for CT-guided bone marrow biopsy for further evaluation.  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 Known DISH with thoracic spine fusion and moderate kyphosis, h/o L spine fusion L2-5   Adjustment disorder with depressed mood 11-13-2014   60 yo son died MI Nov 02, 2014 Husband died car accident May 04, 2015   Anemia    Anticoagulant long-term use 06/14/2019   She takes warfarin for afib/flutter with CHADS2VASc score of 4 (age, sex, CHF, HTN), s/p 2 bioprosthetic valve replacements Coumadin changed to eliquis 2022 per cardiology   Aortic atherosclerosis 03/03/2022   Noted on 01/2022 chest CT   Arthritis    Arthropathy of spinal facet joint 03/08/2018   Ascending aortic aneurysm 03/02/2021   4.1cm on CT 01/2021 rec yearly monitoring   Bilateral hip pain 09/08/2016   Cancer (HCC)    conon cancer 2012   CAP (community acquired pneumonia) 02/08/2016   Carotid stenosis 12/08/2020   Carotid US - 40-59% BICA stenosis (2015)  Carotid US 12/2020 - 1-39% BICA stenosis, incidental 3.4cm structure behind L common carotid artery rec further imaging    Cataracts, bilateral    immature   Chest pain 08/25/2014   myoview low risk 02/2018 (Camitz)   Chronic cholecystitis with calculus 01/21/2015   S/p cholecystectomy    Chronic diastolic heart failure 09/19/2014   Chronic insomnia    Chronic lower back pain 03/05/2008   Returned to Dr Danielle Dess - translaminar ESI at L3/4. Myelogram showed significant left sided foraminal stenosis at L2/3 and L3/4 in setting of solid arthrodesis, planned DEXA and if stable osteopenia, considering anterolateral decompression with spacer.    CKD (chronic kidney disease) stage 2, GFR 60-89 ml/min 07/03/2015   Constipation    takes Miralax daily as needed   COPD (chronic obstructive pulmonary disease)    Albuterol inhaler daily as needed;Duoneb daily as needed;Spiriva daily   COVID-19 virus infection 11/11/2022   DDD (degenerative disc disease)    cervical - kyphosis with mod DD changes C5/6 and C6/7; lumbar - early DD at L2/3 (Elsner)   Depression    DISH (diffuse idiopathic skeletal hyperostosis) 05/14/2019   By MRI noted fusion of lower thoracic vertebrae - likely contributing to unsteadiness (Elsner)   Dyspnea on exertion 06/05/2015   myoview low risk 02/2018   Edema of both lower legs due to peripheral venous insufficiency    Essential hypertension, benign 03/24/2009   hx of not on nmeds currenlty and has been a while since on meds per pt   Heart murmur    Hematuria 06/17/2021   History of kidney stones    History of radiation therapy 05/30/11 to 07/07/11   rectum  History of rectal cancer 05/26/2011   Rectal cancer 2012, midrectum [id number], s/p neoadj chemo&XRT, lap LAR with diverting loop ileostomy 08/2011, ileostomy takedown 03/2012 Discharged from oncology clinic 2017 Truett Perna)   History of shingles    History of vertebral fracture 11/26/2019   Old L1 vertebral compression fracture  incidentally noted on imaging 2020    Hyperlipemia    takes Lovastatin daily   Lacunar infarction    Left caudate   Left hip pain 07/29/2020   Legally blind in left eye    Lymphangioma 01/07/2021   Noted incidentally on carotid US 12/2020 CT neck - Likely benign lymphangioma 01/2021   Macrocytosis 03/27/2022   Folate and b12 checked 2023   Malaise and fatigue 01/22/2018   Meralgia paresthetica 12/13/2007   Mixed stress and urge urinary incontinence 03/16/2015   Failed oxybutynin IR/ER. Vesicare initially helped but then no longer helpful.  myrbetriq was not effective.  Saw urology (MacDiarmid) - UDS largely overactive bladder with mild-mod stress incontinence. Improved on abx course - maintained on [place].    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   Pulmonary infiltrates 04/23/2019   06/13/2018-CT super D chest without contrast- generally stable chronic lung disease with pleural parenchymal scarring and scattered nodularity most of these nodules have been  tree-in-bud in appearance likely postinfectious or inflammatory the dominant right lower lobe nodule seen on most recent study has resolved no new or enlarging nodules  04/22/2019-CT chest without contrast- waxing and waning per   Pulmonary nodule 02/23/2012   RLL nodule-71mm stable 2006, April 2009, and June 2009 Small pulm nodules Jan 2012 - > Oct 2012 without change   Rheumatic heart disease mitral stenosis    mod MS by echo 02/2014   Right leg swelling 06/11/2020   R venous US 06/2020 WNL: - No evidence of deep vein thrombosis in the lower extremity. No indirect evidence of obstruction proximal to the inguinal ligament. - No cystic structure found in the popliteal fossa.   S/P aortic and mitral valve bioprostheses 12/08/2013   AVR 21 mm, MVR 25 mm-MagnaEase pericardia   S/P AVR (aortic valve replacement) 2015   bioprosthetic (Bartle)   S/P MVR (mitral valve replacement) 2015   bioprosthetic (Bartle)   Sepsis secondary to UTI (HCC) 08/22/2017   Severe aortic valve stenosis 10/01/2013   mild-mod by echo 02/2014   Skin lesion 01/03/2022   Small bowel obstruction, partial 08/2013   reolved without NGT placement.    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   Dr Amy [place]   Syncope 05/28/2018   Thrombocytopenia 11/04/2019  Typical atrial flutter 05/15/2019   Vitamin B12 deficiency 02/28/2015   Start B12 shots 02/2015    Vitamin D deficiency 10/23/2016   Wounds, multiple    bilateral legs    Past Surgical History:  Procedure Laterality Date   ANTERIOR LAT LUMBAR FUSION Left 07/17/2018   Procedure: Lumbar Two-Three Lumbar Three-Four Anterolateral decompression/interbody fusion with lateral plate fixation/Infuse;  Surgeon: Barnett Abu, MD;  Location: [facility] OR;  Service: Neurosurgery;  Laterality: Left;  Lumbar Two-Three Lumbar Three-Four Anterolateral decompression/interbody fusion with lateral plate fixation/Infuse   AORTIC VALVE REPLACEMENT N/A 12/12/2013   Procedure:  AORTIC VALVE REPLACEMENT (AVR);  Surgeon: Alleen Borne, MD; Service: Open Heart Surgery   BACK SURGERY  2006, 2007   2006 SPACER, 2007 decompression and fusion L4/5   BOWEL RESECTION  04/02/2012   Procedure: SMALL BOWEL RESECTION;  Surgeon: [name], MD;  Location: [facility] ORS;  Service: General;  Laterality: N/A;   BRONCHIAL WASHINGS  03/24/2021   Procedure: BRONCHIAL WASHINGS;  Surgeon: [name], MD;  Location: [facility] ENDOSCOPY;  Service: Endoscopy;;   CARPAL TUNNEL RELEASE Bilateral    CATARACT EXTRACTION W/ INTRAOCULAR LENS IMPLANT Left 10/18/2016   CATARACT EXTRACTION W/ INTRAOCULAR LENS IMPLANT Right 12/13/2016   Dr. Dagoberto Ligas   CHOLECYSTECTOMY N/A 01/21/2015   chronic cholecystitis, [name], MD   COLON RESECTION  08/25/2011   Procedure: COLON RESECTION LAPAROSCOPIC;  Surgeon: [name], MD;  Location: [facility] ORS;  Service: General;  Laterality: N/A;  Laparoscopic Assisted Low Anterior Resection Diverting Ostomy and onQ pain pump   COLONOSCOPY  03/2013   1 polyp, rpt 3 yrs Christella Hartigan)   COLONOSCOPY  05/2021   diverticulosis and patent colon anastomosis with some edema/narrowing, rpt 5 yrs Christella Hartigan)   COLONOSCOPY WITH PROPOFOL N/A 06/09/2016   patent colo-colonic anastomosis, rpt 5 yrs Christella Hartigan)   CYSTOSCOPY/URETEROSCOPY/HOLMIUM LASER/STENT PLACEMENT Right 07/03/2023   Procedure: CYSTOSCOPY RIGHT RETROGRADE PYEPLGRAM RIGHT URETEROSCOPY/HOLMIUM LASER/STENT PLACEMENT;  Surgeon: Crista Elliot, MD;  Location: [facility];  Service: Urology;  Laterality: Right;  1 HR FOR CASE   CYSTOSCOPY/URETEROSCOPY/HOLMIUM LASER/STENT PLACEMENT Right 07/17/2023   Procedure: CYSTOSCOPY/RIGHT URETEROSCOPY/STENT EXCHANGE;  Surgeon: Crista Elliot, MD;  Location: [facility];  Service: Urology;  Laterality: Right;  60 MINS FOR CASE   EP IMPLANTABLE DEVICE N/A 06/16/2016   Procedure: Pacemaker Implant;  Surgeon: Will Jorja Loa, MD;  Location: [facility] INVASIVE CV LAB;  Service: Cardiovascular;  Laterality:  N/A;   FOOT SURGERY  left foot   hammer toe   ILEOSTOMY  08/25/2011   ILEOSTOMY CLOSURE  04/02/2012   Procedure: ILEOSTOMY TAKEDOWN;  Surgeon: [name], MD;  Location: [facility];  Service: General;  Laterality: N/A;   INTRAOPERATIVE TRANSESOPHAGEAL ECHOCARDIOGRAM N/A 12/12/2013   Procedure: INTRAOPERATIVE TRANSESOPHAGEAL ECHOCARDIOGRAM;  Surgeon: Alleen Borne, MD;  Location: [facility] OR;  Service: Open Heart Surgery;  Laterality: N/A;   LEFT AND RIGHT HEART CATHETERIZATION WITH CORONARY ANGIOGRAM N/A 11/27/2013   Procedure: LEFT AND RIGHT HEART CATHETERIZATION WITH CORONARY ANGIOGRAM;  Surgeon: Thurmon Fair, MD;  Location: [facility] CATH LAB;  Service: Cardiovascular;  Laterality: N/A;   MITRAL VALVE REPLACEMENT N/A 12/12/2013   Procedure: MITRAL VALVE (MV) REPLACEMENT OR REPAIR;  Surgeon: Alleen Borne, MD; Service: Open Heart Surgery   TEE WITHOUT CARDIOVERSION N/A 11/29/2013   Procedure: TRANSESOPHAGEAL ECHOCARDIOGRAM (TEE);  Surgeon: Quintella Reichert, MD;  Location: [facility] ENDOSCOPY;  Service: Cardiovascular;  Laterality: N/A;   TEE WITHOUT CARDIOVERSION N/A 06/04/2019   Procedure: TRANSESOPHAGEAL ECHOCARDIOGRAM (TEE);  Surgeon:  Croitoru, Rachelle Hora, MD;  Location: [facility] ENDOSCOPY;  Service: Cardiovascular;  Laterality: N/A;   TONSILLECTOMY     VIDEO BRONCHOSCOPY N/A 03/24/2021   Procedure: VIDEO BRONCHOSCOPY WITHOUT FLUORO;  Surgeon: [name], MD;  Location: [facility] ENDOS[facility];  Service: Endoscopy;  Laterality: N/A;  SMALL SCOPE,PREMEDICATE WITH NEBULIZED LIDOCAINE    Allergies: Codeine, Prednisone, and Sulfa antibiotics  Medications: Prior to Admission medications   Medication Sig Start Date End Date Taking? Authorizing Provider  acetaminophen (TYLENOL) 500 MG tablet Take 2 tablets (1,000 mg total) by mouth daily. 05/02/19   [name], MD  acidophilus (RISAQUAD) CAPS capsule Take 1 capsule by mouth daily.    [provider]  albuterol (VENTOLIN HFA) 108 (90 Base) MCG/ACT inhaler  Inhale 2 puffs into the lungs every 6 (six) hours as needed for wheezing. 06/05/23   [name], MD  alendronate (FOSAMAX) 70 MG tablet Take 1 tablet (70 mg total) by mouth every 7 (seven) days. Take with a full glass of water on an empty stomach. Patient not taking: Reported on 08/17/2023 06/28/23   [name], MD  ascorbic acid (VITAMIN C) 500 MG tablet Take 1 tablet (500 mg total) by mouth daily. 10/05/22   [name], MD  Biotin 1000 MCG tablet Take 1,000 mcg by mouth daily.    [provider]  buPROPion (WELLBUTRIN SR) 150 MG 12 hr tablet TAKE 1 TABLET(150 MG) BY MOUTH EVERY MORNING 07/21/23   [name], MD  calcium carbonate (OSCAL) 1500 (600 Ca) MG TABS tablet Take 600 mg of elemental calcium by mouth daily.    [provider]  carboxymethylcellulose (REFRESH PLUS) 0.5 % SOLN Place 1 drop into both eyes 3 (three) times daily as needed (dry eyes).    [provider]  cephALEXin (KEFLEX) 250 MG capsule Take 250 mg by mouth 3 (three) times daily. 07/13/23   [provider]  cholecalciferol (VITAMIN D3) 25 MCG (1000 UNIT) tablet Take 1,000 Units by mouth daily.    [provider]  docusate sodium (COLACE) 100 MG capsule Take 200 mg by mouth daily.    [provider]  ELIQUIS 5 MG TABS tablet TAKE 1 TABLET(5 MG) BY MOUTH TWICE DAILY 02/13/23   Croitoru, Mihai, MD  Fluticasone-Umeclidin-Vilant (TRELEGY ELLIPTA) 200-62.5-25 MCG/ACT AEPB Inhale 1 puff into the lungs daily. 06/05/23   [name], MD  furosemide (LASIX) 40 MG tablet Take 1 tablet (40 mg total) by mouth daily. May take an extra tablet daily as needed for swelling. 07/21/22   Croitoru, Mihai, MD  gabapentin (NEURONTIN) 100 MG capsule Take 1 capsule (100 mg total) by mouth 2 (two) times daily as needed (back pain - for daytime use). 04/24/23   [name], MD  gabapentin (NEURONTIN) 300 MG capsule Take 300 mg by mouth at bedtime.    [provider]  hydrocortisone (ANUSOL-HC) 2.5 % rectal cream Place 1 Application rectally 2 (two) times daily. 04/24/23   [name], MD  ipratropium-albuterol (DUONEB) 0.5-2.5 (3) MG/3ML SOLN Take 3 mLs by nebulization every 6 (six) hours as needed. Patient not taking: Reported on 08/17/2023 11/14/14   Pecola Lawless, MD  lovastatin (MEVACOR) 40 MG tablet TAKE 1 TABLET(40 MG) BY MOUTH AT BEDTIME 07/21/23   [name], MD  montelukast (SINGULAIR) 10 MG tablet TAKE 1 TABLET(10 MG) BY MOUTH AT BEDTIME 02/13/23   [name], MD  Multiple Vitamins-Minerals (PRESERVISION AREDS 2 PO) Take 2 capsules by mouth every evening.     [provider]  ondansetron (ZOFRAN-ODT) 4 MG disintegrating tablet Take 1 tablet (4 mg total) by mouth every 8 (eight) hours as needed for nausea or vomiting. Patient not taking: Reported on 08/17/2023 07/03/23   Crista Elliot, MD  Phenazopyrid-Cranbry-C-Probiot (AZO URINARY TRACT SUPPORT PO) Take 1 capsule by mouth daily.    [provider]  polyethylene glycol (MIRALAX / GLYCOLAX) packet Take 17 g by mouth daily. Patient taking differently: Take 17 g by mouth daily as needed. 12/02/13   Dwana Melena, PA-C  potassium chloride (KLOR-CON) 10 MEQ tablet TAKE 2 TABLETS(20 MEQ) BY MOUTH DAILY 07/21/23   Croitoru, Mihai, MD  psyllium (REGULOID) 0.52 g capsule Take 0.52 g by mouth daily.    [provider]  traMADol (ULTRAM) 50 MG tablet TAKE 1 TABLET(50 MG) BY MOUTH TWICE DAILY AS NEEDED 02/14/23   [name], MD  traZODone (DESYREL) 50 MG tablet TAKE 1/2 TO 1 TABLET(25 TO 50 MG) BY MOUTH AT BEDTIME 06/27/23   [name], MD  Vibegron (GEMTESA) 75 MG TABS Take 75 mg by mouth daily.    [provider]  vitamin B-12 (CYANOCOBALAMIN) 500 MCG tablet Take 1 tablet (500 mcg total) by mouth 2 (two) times a week. 01/06/22   [name], MD  Zinc 50 MG CAPS Take 1 capsule (50 mg total) by mouth daily. 11/04/19   [name],  MD     Family History  Problem Relation Age of Onset   Heart disease Mother    Diabetes Mother    Hypertension Mother    Stroke Mother    Lung cancer Father    Breast cancer Maternal Aunt 37   Heart attack Maternal Grandfather    CAD Son 11   Heart attack Son 68   Rectal cancer Neg Hx    Stomach cancer Neg Hx    Esophageal cancer Neg Hx    Colon cancer Neg Hx     Social History   Socioeconomic History   Marital status: Widowed    Spouse name: Not on file   Number of children: 2   Years of education: 49   Highest education level: High school graduate  Occupational History   Occupation: Retired  Tobacco Use   Smoking status: Former    Current packs/day: 0.00    Average packs/day: 1.5 packs/day for 10.0 years (15.0 ttl pk-yrs)    Types: Cigarettes    Start date: 12/27/1964    Quit date: 12/28/1974    Years since quitting: 48.6   Smokeless tobacco: Never   Tobacco comments:    quit smoking in 1976  Vaping Use   Vaping status: Never Used  Substance and Sexual Activity   Alcohol use: Never    Alcohol/week: 2.0 standard drinks of alcohol    Types: 2 Glasses of wine per week   Drug use: No   Sexual activity: Not Currently  Other Topics Concern   Not on file  Social History Narrative   Widow. Husband died April 09, 2015 MVA. 1 cat.    2 sons; 1 grandchild. One son died of MI 75   Edu: Completed HS, some college   Married 1966   Retired-[employer], volunteers at [employer]   Activity: pulm rehab   Diet: some water, fruits/vegetables daily      Right handed   Caffeine: 4 glasses of tea and 1 diet coke a day   Social Determinants of Health   Financial Resource Strain: Low Risk  (01/20/2023)   Overall Financial Resource Strain (CARDIA)  Difficulty of Paying Living Expenses: Not very hard  Food Insecurity: No Food Insecurity (08/17/2023)   Hunger Vital Sign    Worried About Running Out of Food in the Last Year: Never true    Ran Out of Food in the Last Year: Never true   Transportation Needs: No Transportation Needs (08/17/2023)   PRAPARE - Administrator, Civil Service (Medical): No    Lack of Transportation (Non-Medical): No  Physical Activity: Unknown (01/20/2023)   Exercise Vital Sign    Days of Exercise per Week: Patient declined    Minutes of Exercise per Session: Not on file  Stress: No Stress Concern Present (01/20/2023)   [employer] of Occupational Health - Occupational Stress Questionnaire    Feeling of Stress : Only a little  Social Connections: Moderately Integrated (01/20/2023)   Social Connection and Isolation Panel [NHANES]    Frequency of Communication with Friends and Family: More than three times a week    Frequency of Social Gatherings with Friends and Family: More than three times a week    Attends Religious Services: More than 4 times per year    Active Member of [employer] or Organizations: Yes    Attends Banker Meetings: More than 4 times per year    Marital Status: Widowed      Review of Systems: denies fever,HA,CP,cough, abd pain,N/V ; she does have dyspnea with exertion, back pain, easy bruising  Vital Signs: Vitals:   08/23/23 0730  BP: 135/79  Pulse: 72  Resp: 18  Temp: 98.3 F (36.8 C)  SpO2: 92%      Code Status: FULL CODE  Advance Care Plan: No documents on file   Physical Exam: awake/alert; chest- sl dim BS bases; left chest wall pacer; heart- irreg rhythm , nl rate; +murmur; abd-soft,+BS,NT;no edema; scatt ecchymoses; neural stimulator device LLQ  Imaging: CUP PACEART REMOTE DEVICE CHECK  Result Date: 07/28/2023 Scheduled remote reviewed. Normal device function.  Multiple AHR detections, longest recent episode 14 minutes, EGMs consistent with 1:1 tachycardia with some P waves in PVARP resulting in atrial pacing with functional non-capture. Next remote 91 days. - CS, CVRS   Labs:  CBC: Recent Labs    06/29/23 0800 07/17/23 0734 08/01/23 0835 08/17/23 1304  WBC 4.6 7.8  6.4 6.0  HGB 10.2* 11.5* 12.2 10.7*  HCT 32.9* 38.2 37.7 34.1*  PLT 86* 112* 110.0* 79*    COAGS: Recent Labs    05/07/23 0949  INR 1.6*    BMP: Recent Labs    10/09/22 1030 05/07/23 0949 06/21/23 0729 06/29/23 0800 07/17/23 0734  NA 139 140 140 137 139  K 3.9 3.5 4.1 Hemolysis seen... 3.8 4.0  CL 100 102 101 101 103  CO2 29 29 34* 28 26  GLUCOSE 139* 121* 79 84 88  BUN 14 15 15 18 19   CALCIUM 9.2 9.1 9.1 8.8* 9.1  CREATININE 0.80 1.04* 0.88 0.80 0.98  GFRNONAA >60 55*  --  >60 59*    LIVER FUNCTION TESTS: Recent Labs    06/21/23 0729  BILITOT 0.8  AST 28  ALT 11  ALKPHOS 65  PROT 6.2  ALBUMIN 3.4*    TUMOR MARKERS: No results for input(s): "AFPTM", "CEA", "CA199", "CHROMGRNA" in the last 8760 hours.  Assessment and Plan: 77 y.o. female with PMH sig for chronic low back pain, depression, arthritis, ascending aortic aneurysm, carotid stenosis, heart failure, chronic kidney disease, COPD, degenerative disc disease, hypertension, nephrolithiasis, rectal cancer, legally blind  left eye, A-fib, pacemaker, aortic and mitral valve bioprostheses, chronic venous stasis with lower extremity ulcers who presents now with chronic red cell macrocytosis and intermittent mild thrombocytopenia of uncertain etiology.  She is scheduled today for CT-guided bone marrow biopsy for further evaluation.Risks and benefits of procedure was discussed with the patient/granddaughter  including, but not limited to bleeding, infection, damage to adjacent structures or low yield requiring additional tests.  All of the questions were answered and there is agreement to proceed.  Consent signed and in chart.    Thank you for this interesting consult.  I greatly enjoyed meeting [name] and look forward to participating in their care.  A copy of this report was sent to the requesting provider on this date.  Electronically Signed: D. Jeananne Rama, PA-C 08/22/2023, 1:19 PM   I  spent a total of  20 minutes   in face to face in clinical consultation, greater than 50% of which was counseling/coordinating care for CT-guided bone marrow biopsy

## 2023-08-23 ENCOUNTER — Encounter (HOSPITAL_COMMUNITY): Payer: Self-pay

## 2023-08-23 ENCOUNTER — Ambulatory Visit (HOSPITAL_COMMUNITY)
Admission: RE | Admit: 2023-08-23 | Discharge: 2023-08-23 | Disposition: A | Discharge status: Home / Self Care | Payer: Medicare Other | Source: Ambulatory Visit | Attending: Oncology

## 2023-08-23 ENCOUNTER — Ambulatory Visit: Payer: Medicare Other | Admitting: Internal Medicine

## 2023-08-23 ENCOUNTER — Other Ambulatory Visit: Payer: Self-pay

## 2023-08-23 ENCOUNTER — Ambulatory Visit (HOSPITAL_COMMUNITY)
Admission: RE | Admit: 2023-08-23 | Discharge: 2023-08-23 | Disposition: A | Discharge status: Home / Self Care | Payer: Medicare Other | Source: Ambulatory Visit | Attending: Oncology | Admitting: Oncology

## 2023-08-23 DIAGNOSIS — Z87891 Personal history of nicotine dependence: Secondary | ICD-10-CM | POA: Insufficient documentation

## 2023-08-23 DIAGNOSIS — D539 Nutritional anemia, unspecified: Secondary | ICD-10-CM | POA: Diagnosis not present

## 2023-08-23 DIAGNOSIS — H548 Legal blindness, as defined in USA: Secondary | ICD-10-CM | POA: Diagnosis not present

## 2023-08-23 DIAGNOSIS — Z8616 Personal history of COVID-19: Secondary | ICD-10-CM | POA: Diagnosis not present

## 2023-08-23 DIAGNOSIS — I130 Hypertensive heart and chronic kidney disease with heart failure and stage 1 through stage 4 chronic kidney disease, or unspecified chronic kidney disease: Secondary | ICD-10-CM | POA: Diagnosis not present

## 2023-08-23 DIAGNOSIS — D7589 Other specified diseases of blood and blood-forming organs: Secondary | ICD-10-CM | POA: Insufficient documentation

## 2023-08-23 DIAGNOSIS — D72810 Lymphocytopenia: Secondary | ICD-10-CM | POA: Diagnosis not present

## 2023-08-23 DIAGNOSIS — Z952 Presence of prosthetic heart valve: Secondary | ICD-10-CM | POA: Diagnosis not present

## 2023-08-23 DIAGNOSIS — F32A Depression, unspecified: Secondary | ICD-10-CM | POA: Diagnosis not present

## 2023-08-23 DIAGNOSIS — D696 Thrombocytopenia, unspecified: Secondary | ICD-10-CM | POA: Insufficient documentation

## 2023-08-23 DIAGNOSIS — Z950 Presence of cardiac pacemaker: Secondary | ICD-10-CM | POA: Insufficient documentation

## 2023-08-23 LAB — CBC WITH DIFFERENTIAL/PLATELET
Abs Immature Granulocytes: 0.04 10*3/uL (ref 0.00–0.07)
Basophils Absolute: 0.1 10*3/uL (ref 0.0–0.1)
Basophils Relative: 1 %
Eosinophils Absolute: 0.3 10*3/uL (ref 0.0–0.5)
Eosinophils Relative: 4 %
HCT: 37.2 % (ref 36.0–46.0)
Hemoglobin: 11.5 g/dL — ABNORMAL LOW (ref 12.0–15.0)
Immature Granulocytes: 1 %
Lymphocytes Relative: 3 %
Lymphs Abs: 0.3 10*3/uL — ABNORMAL LOW (ref 0.7–4.0)
MCH: 32 pg (ref 26.0–34.0)
MCHC: 30.9 g/dL (ref 30.0–36.0)
MCV: 103.6 fL — ABNORMAL HIGH (ref 80.0–100.0)
Monocytes Absolute: 0.8 10*3/uL (ref 0.1–1.0)
Monocytes Relative: 9 %
Neutro Abs: 7 10*3/uL (ref 1.7–7.7)
Neutrophils Relative %: 82 %
Platelets: 100 10*3/uL — ABNORMAL LOW (ref 150–400)
RBC: 3.59 MIL/uL — ABNORMAL LOW (ref 3.87–5.11)
RDW: 14.4 % (ref 11.5–15.5)
WBC: 8.5 10*3/uL (ref 4.0–10.5)
nRBC: 0 % (ref 0.0–0.2)

## 2023-08-23 MED ORDER — FLUMAZENIL 0.5 MG/5ML IV SOLN
INTRAVENOUS | Status: AC
  Filled 2023-08-23: qty 5

## 2023-08-23 MED ORDER — LIDOCAINE-EPINEPHRINE 1 %-1:100000 IJ SOLN
INTRAMUSCULAR | Status: AC | PRN
  Administered 2023-08-23: 10 mL via INTRADERMAL

## 2023-08-23 MED ORDER — MIDAZOLAM HCL 2 MG/2ML IJ SOLN
INTRAMUSCULAR | Status: AC
  Filled 2023-08-23: qty 2

## 2023-08-23 MED ORDER — FENTANYL CITRATE (PF) 100 MCG/2ML IJ SOLN
INTRAMUSCULAR | Status: AC | PRN
  Administered 2023-08-23: 25 ug via INTRAVENOUS

## 2023-08-23 MED ORDER — NALOXONE HCL 0.4 MG/ML IJ SOLN
INTRAMUSCULAR | Status: AC
  Filled 2023-08-23: qty 1

## 2023-08-23 MED ORDER — FENTANYL CITRATE (PF) 100 MCG/2ML IJ SOLN
INTRAMUSCULAR | Status: AC
  Filled 2023-08-23: qty 2

## 2023-08-23 MED ORDER — MIDAZOLAM HCL 2 MG/2ML IJ SOLN
INTRAMUSCULAR | Status: AC | PRN
  Administered 2023-08-23: 1 mg via INTRAVENOUS

## 2023-08-23 MED ORDER — MIDAZOLAM HCL 2 MG/2ML IJ SOLN
INTRAMUSCULAR | Status: AC | PRN
  Administered 2023-08-23: .5 mg via INTRAVENOUS

## 2023-08-23 MED ORDER — SODIUM CHLORIDE 0.9 % IV SOLN
INTRAVENOUS | Status: DC
Start: 2023-08-23 — End: 2023-08-24

## 2023-08-23 MED ORDER — FENTANYL CITRATE (PF) 100 MCG/2ML IJ SOLN
INTRAMUSCULAR | Status: AC | PRN
  Administered 2023-08-23: 50 ug via INTRAVENOUS

## 2023-08-23 NOTE — Discharge Instructions (Addendum)
Discharge Instructions:   Please call Interventional Radiology clinic [phone] with any questions or concerns.  You may remove your bandaid and shower tomorrow.      Bone Marrow Aspiration and Bone Marrow Biopsy, Adult, Care After This sheet gives you information about how to care for yourself after your procedure. Your health care provider may also give you more specific instructions. If you have problems or questions, contact your health care provider. What can I expect after the procedure? After the procedure, it is common to have: Mild pain and tenderness. Swelling. Bruising. Follow these instructions at home: Puncture site care  Follow instructions from your health care provider about how to take care of the puncture site. Make sure you: Wash your hands with soap and water before and after you change your bandage (dressing). If soap and water are not available, use hand sanitizer. Change your dressing as told by your health care provider. Check your puncture site every day for signs of infection. Check for: More redness, swelling, or pain. Fluid or blood. Warmth. Pus or a bad smell. Activity Return to your normal activities as told by your health care provider. Ask your health care provider what activities are safe for you. Do not lift anything that is heavier than 10 lb (4.5 kg), or the limit that you are told, until your health care provider says that it is safe. Do not drive for 24 hours if you were given a sedative during your procedure. General instructions  Take over-the-counter and prescription medicines only as told by your health care provider. Do not take baths, swim, or use a hot tub until your health care provider approves. Ask your health care provider if you may take showers. You may only be allowed to take sponge baths. If directed, put ice on the affected area. To do this: Put ice in a plastic bag. Place a towel between your skin and the bag. Leave the  ice on for 20 minutes, 2-3 times a day. Keep all follow-up visits as told by your health care provider. This is important. Contact a health care provider if: Your pain is not controlled with medicine. You have a fever. You have more redness, swelling, or pain around the puncture site. You have fluid or blood coming from the puncture site. Your puncture site feels warm to the touch. You have pus or a bad smell coming from the puncture site. Summary After the procedure, it is common to have mild pain, tenderness, swelling, and bruising. Follow instructions from your health care provider about how to take care of the puncture site and what activities are safe for you. Take over-the-counter and prescription medicines only as told by your health care provider. Contact a health care provider if you have any signs of infection, such as fluid or blood coming from the puncture site. This information is not intended to replace advice given to you by your health care provider. Make sure you discuss any questions you have with your health care provider. Document Revised: 02/12/2019 Document Reviewed: 02/12/2019 [employer] Patient Education  2023 [employer]     Moderate Conscious Sedation, Adult, Care After This sheet gives you information about how to care for yourself after your procedure. Your health care provider may also give you more specific instructions. If you have problems or questions, contact your health care provider. What can I expect after the procedure? After the procedure, it is common to have: Sleepiness for several hours. Impaired judgment for several hours. Difficulty  with balance. Vomiting if you eat too soon. Follow these instructions at home: For the time period you were told by your health care provider: Rest. Do not participate in activities where you could fall or become injured. Do not drive or use machinery. Do not drink alcohol. Do not take sleeping pills or  medicines that cause drowsiness. Do not make important decisions or sign legal documents. Do not take care of children on your own. Eating and drinking  Follow the diet recommended by your health care provider. Drink enough fluid to keep your urine pale yellow. If you vomit: Drink water, juice, or soup when you can drink without vomiting. Make sure you have little or no nausea before eating solid foods. General instructions Take over-the-counter and prescription medicines only as told by your health care provider. Have a responsible adult stay with you for the time you are told. It is important to have someone help care for you until you are awake and alert. Do not smoke. Keep all follow-up visits as told by your health care provider. This is important. Contact a health care provider if: You are still sleepy or having trouble with balance after 24 hours. You feel light-headed. You keep feeling nauseous or you keep vomiting. You develop a rash. You have a fever. You have redness or swelling around the IV site. Get help right away if: You have trouble breathing. You have new-onset confusion at home. Summary After the procedure, it is common to feel sleepy, have impaired judgment, or feel nauseous if you eat too soon. Rest after you get home. Know the things you should not do after the procedure. Follow the diet recommended by your health care provider and drink enough fluid to keep your urine pale yellow. Get help right away if you have trouble breathing or new-onset confusion at home. This information is not intended to replace advice given to you by your health care provider. Make sure you discuss any questions you have with your health care provider. Document Revised: 01/24/2020 Document Reviewed: 08/22/2019 [employer] Patient Education  2023 [employer] Please call Interventional Radiology clinic [phone] with any questions or concerns.  You may remove your dressing and  shower tomorrow.  After the procedure, it is common to have: Soreness Bruising Mild pain  Follow these instructions at home:  Medication: Do not use Aspirin or ibuprofen products, such as Advil or Motrin, as it may increase bleeding You may resume your usual medications as ordered by your doctor If your doctor prescribed antibiotics, take them as directed. Do not stop taking them just because you feel better. You need to take the full course of antibiotics  Eating and drinking: Drink plenty of liquids to keep your urine pale yellow You can resume your regular diet as directed by your doctor   Care of the procedure site  Check your biopsy site every day until it heals  Keep the bandage dry. You can take the bandage off and shower tomorrow  If you are bleeding from the biopsy site, apply firm pressure on the area for at least 30 minutes  If directed, apply ice to your biopsy site 2-3 times a day.  o Put ice in a plastic bag  o Place a towel between your skin and the ice bag  o Leave the ice in place for 20 minutes 2-3 times a day  Activity Do not take baths, swim, or use a hot tub until your health care provider approves  Keep all  follow-up visits as told by your doctor  Contact your doctor or seek immediate medical care if: You have bright red bleeding from the biopsy site that does not stop after 30 minutes of holding direct pressure on the site. You have signs of infection, such as: Increased pain, swelling, warmth, or redness at the biopsy site Red streaks leading from the biopsy site Yellow or green drainage from the biopsy site A fever (temperature over 100.47F) chills, or both

## 2023-08-23 NOTE — Procedures (Signed)
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  Post-procedure Diagnosis: Same  Technically successful CT guided bone marrow aspiration and biopsy of left iliac crest.   Complications: None Immediate  EBL: None  Signed: Simonne Come Pager: [phone] 08/23/2023, 9:33 AM

## 2023-08-24 ENCOUNTER — Other Ambulatory Visit: Payer: Self-pay | Admitting: Family Medicine

## 2023-08-24 ENCOUNTER — Other Ambulatory Visit: Payer: Self-pay | Admitting: Cardiovascular Disease

## 2023-08-24 ENCOUNTER — Ambulatory Visit: Payer: Medicare Other | Admitting: Physician Assistant

## 2023-08-24 DIAGNOSIS — G8929 Other chronic pain: Secondary | ICD-10-CM

## 2023-08-24 NOTE — Telephone Encounter (Signed)
Last filled 02-14-23 #[id number] Last OV 01-980 Next OV 09-27-23 [facility] [address]

## 2023-08-24 NOTE — Telephone Encounter (Signed)
Prescription refill request for Eliquis received. Indication: PAF Last office visit: 06/29/23 Scr: 0.98 on 07/17/23  Epic Age: 77 Weight: 68.9kg  Based on above findings Eliquis 5mg  twice daily is the appropriate dose.  Refill approved.

## 2023-08-25 LAB — SURGICAL PATHOLOGY

## 2023-08-25 NOTE — Telephone Encounter (Signed)
ERx 

## 2023-08-27 ENCOUNTER — Encounter (HOSPITAL_COMMUNITY): Payer: Self-pay

## 2023-08-27 ENCOUNTER — Emergency Department (HOSPITAL_COMMUNITY): Payer: Medicare Other

## 2023-08-27 ENCOUNTER — Other Ambulatory Visit: Payer: Self-pay

## 2023-08-27 ENCOUNTER — Inpatient Hospital Stay (HOSPITAL_COMMUNITY)
Admission: EM | Admit: 2023-08-27 | Discharge: 2023-08-30 | DRG: 291 | Disposition: A | Discharge status: Home / Self Care | Payer: Medicare Other | Attending: Family Medicine | Admitting: Family Medicine

## 2023-08-27 DIAGNOSIS — I050 Rheumatic mitral stenosis: Secondary | ICD-10-CM | POA: Diagnosis present

## 2023-08-27 DIAGNOSIS — Z2239 Carrier of other specified bacterial diseases: Secondary | ICD-10-CM

## 2023-08-27 DIAGNOSIS — E538 Deficiency of other specified B group vitamins: Secondary | ICD-10-CM | POA: Diagnosis present

## 2023-08-27 DIAGNOSIS — J449 Chronic obstructive pulmonary disease, unspecified: Secondary | ICD-10-CM | POA: Diagnosis present

## 2023-08-27 DIAGNOSIS — Z7901 Long term (current) use of anticoagulants: Secondary | ICD-10-CM

## 2023-08-27 DIAGNOSIS — R011 Cardiac murmur, unspecified: Secondary | ICD-10-CM | POA: Diagnosis present

## 2023-08-27 DIAGNOSIS — Q2112 Patent foramen ovale: Secondary | ICD-10-CM

## 2023-08-27 DIAGNOSIS — J441 Chronic obstructive pulmonary disease with (acute) exacerbation: Secondary | ICD-10-CM | POA: Diagnosis present

## 2023-08-27 DIAGNOSIS — Z8249 Family history of ischemic heart disease and other diseases of the circulatory system: Secondary | ICD-10-CM

## 2023-08-27 DIAGNOSIS — I130 Hypertensive heart and chronic kidney disease with heart failure and stage 1 through stage 4 chronic kidney disease, or unspecified chronic kidney disease: Secondary | ICD-10-CM | POA: Diagnosis not present

## 2023-08-27 DIAGNOSIS — I4811 Longstanding persistent atrial fibrillation: Secondary | ICD-10-CM | POA: Diagnosis present

## 2023-08-27 DIAGNOSIS — N3281 Overactive bladder: Secondary | ICD-10-CM | POA: Diagnosis present

## 2023-08-27 DIAGNOSIS — Z882 Allergy status to sulfonamides status: Secondary | ICD-10-CM

## 2023-08-27 DIAGNOSIS — T82867A Thrombosis of cardiac prosthetic devices, implants and grafts, initial encounter: Secondary | ICD-10-CM | POA: Diagnosis present

## 2023-08-27 DIAGNOSIS — Z885 Allergy status to narcotic agent status: Secondary | ICD-10-CM

## 2023-08-27 DIAGNOSIS — I5031 Acute diastolic (congestive) heart failure: Secondary | ICD-10-CM | POA: Diagnosis present

## 2023-08-27 DIAGNOSIS — I2721 Secondary pulmonary arterial hypertension: Secondary | ICD-10-CM | POA: Diagnosis present

## 2023-08-27 DIAGNOSIS — Z8616 Personal history of COVID-19: Secondary | ICD-10-CM

## 2023-08-27 DIAGNOSIS — Z85048 Personal history of other malignant neoplasm of rectum, rectosigmoid junction, and anus: Secondary | ICD-10-CM

## 2023-08-27 DIAGNOSIS — Y831 Surgical operation with implant of artificial internal device as the cause of abnormal reaction of the patient, or of later complication, without mention of misadventure at the time of the procedure: Secondary | ICD-10-CM | POA: Diagnosis present

## 2023-08-27 DIAGNOSIS — Z23 Encounter for immunization: Secondary | ICD-10-CM

## 2023-08-27 DIAGNOSIS — Z8781 Personal history of (healed) traumatic fracture: Secondary | ICD-10-CM

## 2023-08-27 DIAGNOSIS — I5033 Acute on chronic diastolic (congestive) heart failure: Secondary | ICD-10-CM | POA: Diagnosis present

## 2023-08-27 DIAGNOSIS — Z86007 Personal history of in-situ neoplasm of skin: Secondary | ICD-10-CM

## 2023-08-27 DIAGNOSIS — F5104 Psychophysiologic insomnia: Secondary | ICD-10-CM | POA: Diagnosis present

## 2023-08-27 DIAGNOSIS — H548 Legal blindness, as defined in USA: Secondary | ICD-10-CM | POA: Diagnosis present

## 2023-08-27 DIAGNOSIS — Z7951 Long term (current) use of inhaled steroids: Secondary | ICD-10-CM

## 2023-08-27 DIAGNOSIS — I4819 Other persistent atrial fibrillation: Secondary | ICD-10-CM | POA: Diagnosis present

## 2023-08-27 DIAGNOSIS — Z7983 Long term (current) use of bisphosphonates: Secondary | ICD-10-CM

## 2023-08-27 DIAGNOSIS — Z8701 Personal history of pneumonia (recurrent): Secondary | ICD-10-CM

## 2023-08-27 DIAGNOSIS — Z888 Allergy status to other drugs, medicaments and biological substances status: Secondary | ICD-10-CM

## 2023-08-27 DIAGNOSIS — I872 Venous insufficiency (chronic) (peripheral): Secondary | ICD-10-CM | POA: Diagnosis present

## 2023-08-27 DIAGNOSIS — Z8711 Personal history of peptic ulcer disease: Secondary | ICD-10-CM

## 2023-08-27 DIAGNOSIS — I4892 Unspecified atrial flutter: Secondary | ICD-10-CM | POA: Diagnosis present

## 2023-08-27 DIAGNOSIS — Z8673 Personal history of transient ischemic attack (TIA), and cerebral infarction without residual deficits: Secondary | ICD-10-CM

## 2023-08-27 DIAGNOSIS — F419 Anxiety disorder, unspecified: Secondary | ICD-10-CM | POA: Diagnosis present

## 2023-08-27 DIAGNOSIS — Z860100 Personal history of colon polyps, unspecified: Secondary | ICD-10-CM

## 2023-08-27 DIAGNOSIS — J9811 Atelectasis: Secondary | ICD-10-CM | POA: Diagnosis present

## 2023-08-27 DIAGNOSIS — F4321 Adjustment disorder with depressed mood: Secondary | ICD-10-CM | POA: Diagnosis present

## 2023-08-27 DIAGNOSIS — E559 Vitamin D deficiency, unspecified: Secondary | ICD-10-CM | POA: Diagnosis present

## 2023-08-27 DIAGNOSIS — I7121 Aneurysm of the ascending aorta, without rupture: Secondary | ICD-10-CM | POA: Diagnosis present

## 2023-08-27 DIAGNOSIS — Z9049 Acquired absence of other specified parts of digestive tract: Secondary | ICD-10-CM

## 2023-08-27 DIAGNOSIS — Z87442 Personal history of urinary calculi: Secondary | ICD-10-CM

## 2023-08-27 DIAGNOSIS — Z634 Disappearance and death of family member: Secondary | ICD-10-CM

## 2023-08-27 DIAGNOSIS — K219 Gastro-esophageal reflux disease without esophagitis: Secondary | ICD-10-CM | POA: Diagnosis present

## 2023-08-27 DIAGNOSIS — Z79899 Other long term (current) drug therapy: Secondary | ICD-10-CM

## 2023-08-27 DIAGNOSIS — Z981 Arthrodesis status: Secondary | ICD-10-CM

## 2023-08-27 DIAGNOSIS — Z923 Personal history of irradiation: Secondary | ICD-10-CM

## 2023-08-27 DIAGNOSIS — T82857A Stenosis of cardiac prosthetic devices, implants and grafts, initial encounter: Secondary | ICD-10-CM | POA: Diagnosis present

## 2023-08-27 DIAGNOSIS — M8580 Other specified disorders of bone density and structure, unspecified site: Secondary | ICD-10-CM | POA: Diagnosis present

## 2023-08-27 DIAGNOSIS — Z952 Presence of prosthetic heart valve: Secondary | ICD-10-CM

## 2023-08-27 DIAGNOSIS — N182 Chronic kidney disease, stage 2 (mild): Secondary | ICD-10-CM | POA: Diagnosis present

## 2023-08-27 DIAGNOSIS — D696 Thrombocytopenia, unspecified: Secondary | ICD-10-CM | POA: Diagnosis present

## 2023-08-27 DIAGNOSIS — R0902 Hypoxemia: Secondary | ICD-10-CM | POA: Diagnosis present

## 2023-08-27 DIAGNOSIS — N3946 Mixed incontinence: Secondary | ICD-10-CM | POA: Diagnosis present

## 2023-08-27 DIAGNOSIS — G629 Polyneuropathy, unspecified: Secondary | ICD-10-CM

## 2023-08-27 DIAGNOSIS — R0602 Shortness of breath: Secondary | ICD-10-CM | POA: Diagnosis not present

## 2023-08-27 DIAGNOSIS — G8929 Other chronic pain: Secondary | ICD-10-CM | POA: Diagnosis present

## 2023-08-27 DIAGNOSIS — Z950 Presence of cardiac pacemaker: Secondary | ICD-10-CM

## 2023-08-27 DIAGNOSIS — I700 Atherosclerosis of aorta: Secondary | ICD-10-CM | POA: Diagnosis present

## 2023-08-27 DIAGNOSIS — Z87891 Personal history of nicotine dependence: Secondary | ICD-10-CM

## 2023-08-27 DIAGNOSIS — I442 Atrioventricular block, complete: Secondary | ICD-10-CM | POA: Diagnosis present

## 2023-08-27 DIAGNOSIS — E785 Hyperlipidemia, unspecified: Secondary | ICD-10-CM | POA: Diagnosis present

## 2023-08-27 DIAGNOSIS — I10 Essential (primary) hypertension: Secondary | ICD-10-CM | POA: Diagnosis present

## 2023-08-27 DIAGNOSIS — Z1152 Encounter for screening for COVID-19: Secondary | ICD-10-CM

## 2023-08-27 DIAGNOSIS — D631 Anemia in chronic kidney disease: Secondary | ICD-10-CM | POA: Diagnosis present

## 2023-08-27 DIAGNOSIS — Z8619 Personal history of other infectious and parasitic diseases: Secondary | ICD-10-CM

## 2023-08-27 DIAGNOSIS — R0600 Dyspnea, unspecified: Principal | ICD-10-CM

## 2023-08-27 LAB — RESP PANEL BY RT-PCR (RSV, FLU A&B, COVID)  RVPGX2
Influenza A by PCR: NEGATIVE
Influenza B by PCR: NEGATIVE
Resp Syncytial Virus by PCR: NEGATIVE
SARS Coronavirus 2 by RT PCR: NEGATIVE

## 2023-08-27 LAB — CBC WITH DIFFERENTIAL/PLATELET
Abs Immature Granulocytes: 0.02 10*3/uL (ref 0.00–0.07)
Basophils Absolute: 0.1 10*3/uL (ref 0.0–0.1)
Basophils Relative: 1 %
Eosinophils Absolute: 0.2 10*3/uL (ref 0.0–0.5)
Eosinophils Relative: 4 %
HCT: 32.5 % — ABNORMAL LOW (ref 36.0–46.0)
Hemoglobin: 10.5 g/dL — ABNORMAL LOW (ref 12.0–15.0)
Immature Granulocytes: 0 %
Lymphocytes Relative: 4 %
Lymphs Abs: 0.3 10*3/uL — ABNORMAL LOW (ref 0.7–4.0)
MCH: 32.9 pg (ref 26.0–34.0)
MCHC: 32.3 g/dL (ref 30.0–36.0)
MCV: 101.9 fL — ABNORMAL HIGH (ref 80.0–100.0)
Monocytes Absolute: 0.6 10*3/uL (ref 0.1–1.0)
Monocytes Relative: 11 %
Neutro Abs: 4.8 10*3/uL (ref 1.7–7.7)
Neutrophils Relative %: 80 %
Platelets: 100 10*3/uL — ABNORMAL LOW (ref 150–400)
RBC: 3.19 MIL/uL — ABNORMAL LOW (ref 3.87–5.11)
RDW: 14.1 % (ref 11.5–15.5)
WBC: 5.9 10*3/uL (ref 4.0–10.5)
nRBC: 0 % (ref 0.0–0.2)

## 2023-08-27 LAB — COMPREHENSIVE METABOLIC PANEL
ALT: 9 U/L (ref 0–44)
AST: 15 U/L (ref 15–41)
Albumin: 3.1 g/dL — ABNORMAL LOW (ref 3.5–5.0)
Alkaline Phosphatase: 60 U/L (ref 38–126)
Anion gap: 8 (ref 5–15)
BUN: 15 mg/dL (ref 8–23)
CO2: 28 mmol/L (ref 22–32)
Calcium: 9 mg/dL (ref 8.9–10.3)
Chloride: 101 mmol/L (ref 98–111)
Creatinine, Ser: 0.88 mg/dL (ref 0.44–1.00)
GFR, Estimated: >60 mL/min (ref >60)
Glucose, Bld: 101 mg/dL — ABNORMAL HIGH (ref 70–99)
Potassium: 3.7 mmol/L (ref 3.5–5.1)
Sodium: 137 mmol/L (ref 135–145)
Total Bilirubin: 0.9 mg/dL (ref <1.2)
Total Protein: 6.5 g/dL (ref 6.5–8.1)

## 2023-08-27 LAB — BRAIN NATRIURETIC PEPTIDE: B Natriuretic Peptide: 293.9 pg/mL — ABNORMAL HIGH (ref 0.0–100.0)

## 2023-08-27 LAB — TROPONIN I (HIGH SENSITIVITY)
Troponin I (High Sensitivity): 121 ng/L (ref <18)
Troponin I (High Sensitivity): 136 ng/L (ref <18)

## 2023-08-27 MED ORDER — GABAPENTIN 300 MG PO CAPS
300.0000 mg | ORAL_CAPSULE | Freq: Every day | ORAL | Status: DC
  Administered 2023-08-27 – 2023-08-29 (×3): 300 mg via ORAL
  Filled 2023-08-27 (×3): qty 1

## 2023-08-27 MED ORDER — CYANOCOBALAMIN 500 MCG PO TABS
500.0000 ug | ORAL_TABLET | ORAL | Status: DC
  Administered 2023-08-28: 500 ug via ORAL
  Filled 2023-08-27: qty 1

## 2023-08-27 MED ORDER — ACETAMINOPHEN 650 MG RE SUPP: 650.0000 mg | Freq: Four times a day (QID) | RECTAL | Status: DC | PRN

## 2023-08-27 MED ORDER — LISINOPRIL 5 MG PO TABS
2.5000 mg | ORAL_TABLET | Freq: Every day | ORAL | Status: DC
Start: 2023-08-27 — End: 2023-08-30
  Administered 2023-08-27 – 2023-08-30 (×3): 2.5 mg via ORAL
  Filled 2023-08-27 (×4): qty 1

## 2023-08-27 MED ORDER — MONTELUKAST SODIUM 10 MG PO TABS
10.0000 mg | ORAL_TABLET | Freq: Every day | ORAL | Status: DC
  Administered 2023-08-28 – 2023-08-30 (×3): 10 mg via ORAL
  Filled 2023-08-27 (×3): qty 1

## 2023-08-27 MED ORDER — PSYLLIUM 95 % PO PACK
1.0000 | PACK | Freq: Every day | ORAL | Status: DC
  Administered 2023-08-28 – 2023-08-29 (×2): 1 via ORAL
  Filled 2023-08-27 (×3): qty 1

## 2023-08-27 MED ORDER — MIRABEGRON ER 25 MG PO TB24
25.0000 mg | ORAL_TABLET | Freq: Every day | ORAL | Status: DC
  Administered 2023-08-28 – 2023-08-30 (×3): 25 mg via ORAL
  Filled 2023-08-27 (×3): qty 1

## 2023-08-27 MED ORDER — IPRATROPIUM-ALBUTEROL 0.5-2.5 (3) MG/3ML IN SOLN
3.0000 mL | Freq: Once | RESPIRATORY_TRACT | Status: AC
  Administered 2023-08-27: 3 mL via RESPIRATORY_TRACT
  Filled 2023-08-27: qty 3

## 2023-08-27 MED ORDER — METHYLPREDNISOLONE SODIUM SUCC 40 MG IJ SOLR
40.0000 mg | Freq: Every day | INTRAMUSCULAR | Status: DC
  Administered 2023-08-28 – 2023-08-30 (×3): 40 mg via INTRAVENOUS
  Filled 2023-08-27 (×3): qty 1

## 2023-08-27 MED ORDER — BUPROPION HCL ER (SR) 150 MG PO TB12
150.0000 mg | ORAL_TABLET | Freq: Every day | ORAL | Status: DC
  Administered 2023-08-28 – 2023-08-30 (×3): 150 mg via ORAL
  Filled 2023-08-27 (×3): qty 1

## 2023-08-27 MED ORDER — APIXABAN 5 MG PO TABS
5.0000 mg | ORAL_TABLET | Freq: Two times a day (BID) | ORAL | Status: DC
  Administered 2023-08-27 – 2023-08-30 (×6): 5 mg via ORAL
  Filled 2023-08-27 (×6): qty 1

## 2023-08-27 MED ORDER — PRAVASTATIN SODIUM 40 MG PO TABS
40.0000 mg | ORAL_TABLET | Freq: Every day | ORAL | Status: DC
  Administered 2023-08-28 – 2023-08-29 (×2): 40 mg via ORAL
  Filled 2023-08-27 (×2): qty 1

## 2023-08-27 MED ORDER — FUROSEMIDE 10 MG/ML IJ SOLN
20.0000 mg | Freq: Two times a day (BID) | INTRAMUSCULAR | Status: DC
  Administered 2023-08-27 – 2023-08-29 (×4): 20 mg via INTRAVENOUS
  Filled 2023-08-27 (×4): qty 2

## 2023-08-27 MED ORDER — INFLUENZA VAC A&B SURF ANT ADJ 0.5 ML IM SUSY
0.5000 mL | PREFILLED_SYRINGE | INTRAMUSCULAR | Status: AC
  Administered 2023-08-30: 0.5 mL via INTRAMUSCULAR
  Filled 2023-08-27: qty 0.5

## 2023-08-27 MED ORDER — TRAMADOL HCL 50 MG PO TABS: 50.0000 mg | ORAL_TABLET | Freq: Two times a day (BID) | ORAL | Status: DC | PRN

## 2023-08-27 MED ORDER — FUROSEMIDE 10 MG/ML IJ SOLN
20.0000 mg | Freq: Once | INTRAMUSCULAR | Status: AC
  Administered 2023-08-27: 20 mg via INTRAVENOUS
  Filled 2023-08-27: qty 4

## 2023-08-27 MED ORDER — PSYLLIUM 0.52 G PO CAPS: 0.5200 g | ORAL_CAPSULE | Freq: Every day | ORAL | Status: DC

## 2023-08-27 MED ORDER — POTASSIUM CHLORIDE CRYS ER 20 MEQ PO TBCR
40.0000 meq | EXTENDED_RELEASE_TABLET | Freq: Once | ORAL | Status: AC
  Administered 2023-08-27: 40 meq via ORAL
  Filled 2023-08-27: qty 2

## 2023-08-27 MED ORDER — UMECLIDINIUM BROMIDE 62.5 MCG/ACT IN AEPB
1.0000 | INHALATION_SPRAY | Freq: Every day | RESPIRATORY_TRACT | Status: DC
  Administered 2023-08-28 – 2023-08-30 (×2): 1 via RESPIRATORY_TRACT
  Filled 2023-08-27 (×2): qty 7

## 2023-08-27 MED ORDER — ORAL CARE MOUTH RINSE: 15.0000 mL | OROMUCOSAL | Status: DC | PRN

## 2023-08-27 MED ORDER — IPRATROPIUM-ALBUTEROL 0.5-2.5 (3) MG/3ML IN SOLN: 3.0000 mL | RESPIRATORY_TRACT | Status: DC | PRN

## 2023-08-27 MED ORDER — METHYLPREDNISOLONE SODIUM SUCC 125 MG IJ SOLR
125.0000 mg | Freq: Once | INTRAMUSCULAR | Status: AC
  Administered 2023-08-27: 125 mg via INTRAVENOUS
  Filled 2023-08-27: qty 2

## 2023-08-27 MED ORDER — FLUTICASONE FUROATE-VILANTEROL 200-25 MCG/ACT IN AEPB
1.0000 | INHALATION_SPRAY | Freq: Every day | RESPIRATORY_TRACT | Status: DC
  Administered 2023-08-28 – 2023-08-30 (×2): 1 via RESPIRATORY_TRACT
  Filled 2023-08-27 (×2): qty 28

## 2023-08-27 MED ORDER — TRAZODONE HCL 50 MG PO TABS
50.0000 mg | ORAL_TABLET | Freq: Every day | ORAL | Status: DC
  Administered 2023-08-27 – 2023-08-29 (×3): 50 mg via ORAL
  Filled 2023-08-27 (×3): qty 1

## 2023-08-27 MED ORDER — POLYETHYLENE GLYCOL 3350 17 G PO PACK: 17.0000 g | PACK | Freq: Every day | ORAL | Status: DC | PRN

## 2023-08-27 MED ORDER — ACETAMINOPHEN 325 MG PO TABS
650.0000 mg | ORAL_TABLET | Freq: Four times a day (QID) | ORAL | Status: DC | PRN
Start: 2023-08-27 — End: 2023-08-30

## 2023-08-27 MED ORDER — POLYVINYL ALCOHOL 1.4 % OP SOLN: 1.0000 [drp] | Freq: Three times a day (TID) | OPHTHALMIC | Status: DC | PRN

## 2023-08-27 MED ORDER — GABAPENTIN 100 MG PO CAPS
100.0000 mg | ORAL_CAPSULE | Freq: Every day | ORAL | Status: DC
  Administered 2023-08-28 – 2023-08-30 (×3): 100 mg via ORAL
  Filled 2023-08-27 (×3): qty 1

## 2023-08-27 MED ORDER — POTASSIUM CHLORIDE CRYS ER 20 MEQ PO TBCR
20.0000 meq | EXTENDED_RELEASE_TABLET | Freq: Every day | ORAL | Status: DC
  Administered 2023-08-28 – 2023-08-30 (×3): 20 meq via ORAL
  Filled 2023-08-27 (×3): qty 1

## 2023-08-27 MED ORDER — CEPHALEXIN 250 MG PO CAPS
250.0000 mg | ORAL_CAPSULE | Freq: Every day | ORAL | Status: DC
  Administered 2023-08-28 – 2023-08-30 (×3): 250 mg via ORAL
  Filled 2023-08-27 (×3): qty 1

## 2023-08-27 NOTE — H&P (Signed)
History and Physical    Patient: [name] ZOX:[id number] DOB: May 14, 1946 DOA: 08/27/2023 DOS: the patient was seen and examined on 08/27/2023 PCP: [name], MD  Patient coming from: Home  Chief Complaint:  Chief Complaint  Patient presents with   Shortness of Breath   HPI: [name] is a 77 y.o. 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  She denied fever, chills, rhinorrhea, sore throat continue or hemoptysis.  No chest pain, palpitations, diaphoresis, PND, orthopnea or pitting edema of the lower extremities.  No abdominal pain, nausea, emesis, diarrhea, constipation, melena or hematochezia.  No flank pain, dysuria, frequency or hematuria.  No polyuria, polydipsia, polyphagia or blurred vision.   Lab work: Coronavirus, influenza and RSV PCR was negative.  CBC is her white count 5.9, hemoglobin 10.5 g/dL with an MCV of 811.9 fL and platelets 100.  CMP showed glucose of 101 mg/dL and albumin 3.1 g/dL.  BNP 294 pg/mL and troponin was 121 then 136 ng/L.  Imaging: Portable 1 view chest radiograph showing cardiomegaly and mild interstitial edema compatible with CHF.  Left greater than right pleural effusions.  Bibasilar airspace disease likely reflect  atelectasis.  ED course: Initial vital signs were temperature 98 F, pulse 70, respiration 22, BP 149/78 mmHg O2 sat 96% on room air.  The patient received furosemide 20 mg IVP, DuoNeb and methylprednisolone 125 mg p.o. daily.   Review of Systems: As mentioned in the history of present illness. All other systems reviewed and are negative. 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 Known DISH with thoracic spine fusion and moderate kyphosis, h/o L spine fusion L2-5   Adjustment disorder with depressed mood 2014/10/26   78 yo son died MI 2014/10/13 Husband died car accident Apr 14, 2015   Anemia    Anticoagulant long-term use 06/14/2019   She takes warfarin for afib/flutter with CHADS2VASc score of 4 (age, sex, CHF, HTN), s/p 2 bioprosthetic valve replacements Coumadin changed to eliquis 2022 per cardiology   Aortic atherosclerosis 03/03/2022   Noted on 01/2022 chest CT   Arthritis    Arthropathy of spinal facet joint 03/08/2018   Ascending aortic aneurysm 03/02/2021   4.1cm on CT 01/2021 rec yearly monitoring   Bilateral hip pain 09/08/2016   Cancer (HCC)    conon cancer 2012   CAP (community acquired pneumonia) 02/08/2016   Carotid stenosis 12/08/2020   Carotid US - 40-59% BICA stenosis (2015) Carotid US 12/2020 - 1-39% BICA stenosis, incidental 3.4cm structure behind L common carotid artery rec further imaging    Cataracts, bilateral    immature   Chest pain 08/25/2014   myoview low risk 02/2018 ([facility])   Chronic cholecystitis with calculus 01/21/2015   S/p cholecystectomy    Chronic diastolic heart failure 09/19/2014   Chronic insomnia  Chronic lower back pain 03/05/2008   Returned to Dr Danielle Dess - translaminar ESI at L3/4. Myelogram showed significant left sided  foraminal stenosis at L2/3 and L3/4 in setting of solid arthrodesis, planned DEXA and if stable osteopenia, considering anterolateral decompression with spacer.    CKD (chronic kidney disease) stage 2, GFR 60-89 ml/min 07/03/2015   Constipation    takes Miralax daily as needed   COPD (chronic obstructive pulmonary disease)    Albuterol inhaler daily as needed;Duoneb daily as needed;Spiriva daily   COVID-19 virus infection 11/11/2022   DDD (degenerative disc disease)    cervical - kyphosis with mod DD changes C5/6 and C6/7; lumbar - early DD at L2/3 ([facility]ner)   Depression    DISH (diffuse idiopathic skeletal hyperostosis) 05/14/2019   By MRI noted fusion of lower thoracic vertebrae - likely contributing to unsteadiness (Elsner)   Dyspnea on exertion 06/05/2015   myoview low risk 02/2018   Edema of both lower legs due to peripheral venous insufficiency    Essential hypertension, benign 03/24/2009   hx of not on nmeds currenlty and has been a while since on meds per pt   Heart murmur    Hematuria 06/17/2021   History of kidney stones    History of radiation therapy 05/30/11 to 07/07/11   rectum   History of rectal cancer 05/26/2011   Rectal cancer 2012, midrectum [id number], s/p neoadj chemo&XRT, lap LAR with diverting loop ileostomy 08/2011, ileostomy takedown 03/2012 Discharged from oncology clinic 2017 Truett Perna)   History of shingles    History of vertebral fracture 11/26/2019   Old L1 vertebral compression fracture incidentally noted on imaging 2020    Hyperlipemia    takes Lovastatin daily   Lacunar infarction    Left caudate   Left hip pain 07/29/2020   Legally blind in left eye    Lymphangioma 01/07/2021   Noted incidentally on carotid US 12/2020 CT neck - Likely benign lymphangioma 01/2021   Macrocytosis 03/27/2022   Folate and b12 checked 2023   Malaise and fatigue 01/22/2018   Meralgia paresthetica 12/13/2007   Mixed stress and urge urinary incontinence 03/16/2015   Failed  oxybutynin IR/ER. Vesicare initially helped but then no longer helpful.  myrbetriq was not effective.  Saw urology (MacDiarmid) - UDS largely overactive bladder with mild-mod stress incontinence. Improved on abx course - maintained on [place].    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   Pulmonary infiltrates 04/23/2019   06/13/2018-CT super D chest without contrast- generally stable chronic lung disease with pleural parenchymal scarring and scattered nodularity most of these nodules have been tree-in-bud in  appearance likely postinfectious or inflammatory the dominant right lower lobe nodule seen on most recent study has resolved no new or enlarging nodules  04/22/2019-CT chest without contrast- waxing and waning per   Pulmonary nodule 02/23/2012   RLL nodule-65mm stable 2006, April 2009, and June 2009 Small pulm nodules Jan 2012 - > Oct 2012 without change   Rheumatic heart disease mitral stenosis    mod MS by echo 02/2014   Right leg swelling 06/11/2020   R venous  US 06/2020 WNL: - No evidence of deep vein thrombosis in the lower extremity. No indirect evidence of obstruction proximal to the inguinal ligament. - No cystic structure found in the popliteal fossa.   S/P aortic and mitral valve bioprostheses 12/08/2013   AVR 21 mm, MVR 25 mm-MagnaEase pericardia   S/P AVR (aortic valve replacement) 2015   bioprosthetic (Bartle)   S/P MVR (mitral valve replacement) 2015   bioprosthetic (Bartle)   Sepsis secondary to UTI (HCC) 08/22/2017   Severe aortic valve stenosis 10/01/2013   mild-mod by echo 02/2014   Skin lesion 01/03/2022   Small bowel obstruction, partial 08/2013   reolved without NGT placement.    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   Dr Amy [place]   Syncope 05/28/2018   Thrombocytopenia 11/04/2019   Typical atrial flutter 05/15/2019   Vitamin B12 deficiency 02/28/2015   Start B12 shots 02/2015    Vitamin D deficiency 10/23/2016   Wounds, multiple    bilateral legs   Past Surgical History:  Procedure Laterality Date   ANTERIOR LAT LUMBAR FUSION Left 07/17/2018   Procedure: Lumbar Two-Three Lumbar Three-Four Anterolateral decompression/interbody fusion with lateral plate fixation/Infuse;  Surgeon: Barnett Abu, MD;  Location: [facility];  Service: Neurosurgery;  Laterality: Left;  Lumbar Two-Three Lumbar Three-Four Anterolateral decompression/interbody fusion with lateral plate fixation/Infuse   AORTIC VALVE REPLACEMENT N/A 12/12/2013   Procedure: AORTIC VALVE REPLACEMENT (AVR);  Surgeon: Alleen Borne, MD; Service: Open Heart Surgery   BACK SURGERY  2006, 2007   2006 SPACER, 2007 decompression and fusion L4/5   BOWEL RESECTION  04/02/2012   Procedure: SMALL BOWEL RESECTION;  Surgeon: [name], MD;  Location: [facility];  Service: General;  Laterality: N/A;   BRONCHIAL WASHINGS  03/24/2021   Procedure: BRONCHIAL WASHINGS;  Surgeon: [name], MD;  Location: [facility];  Service: Endoscopy;;   CARPAL TUNNEL  RELEASE Bilateral    CATARACT EXTRACTION W/ INTRAOCULAR LENS IMPLANT Left 10/18/2016   CATARACT EXTRACTION W/ INTRAOCULAR LENS IMPLANT Right 12/13/2016   Dr. Dagoberto Ligas   CHOLECYSTECTOMY N/A 01/21/2015   chronic cholecystitis, [name], MD   COLON RESECTION  08/25/2011   Procedure: COLON RESECTION LAPAROSCOPIC;  Surgeon: [name], MD;  Location: [facility];  Service: General;  Laterality: N/A;  Laparoscopic Assisted Low Anterior Resection Diverting Ostomy and onQ pain pump   COLONOSCOPY  03/2013   1 polyp, rpt 3 yrs Christella Hartigan)   COLONOSCOPY  05/2021   diverticulosis and patent colon anastomosis with some edema/narrowing, rpt 5 yrs Christella Hartigan)   COLONOSCOPY WITH PROPOFOL N/A 06/09/2016   patent colo-colonic anastomosis, rpt 5 yrs Christella Hartigan)   CYSTOSCOPY/URETEROSCOPY/HOLMIUM LASER/STENT PLACEMENT Right 07/03/2023   Procedure: CYSTOSCOPY RIGHT RETROGRADE PYEPLGRAM RIGHT URETEROSCOPY/HOLMIUM LASER/STENT PLACEMENT;  Surgeon: Crista Elliot, MD;  Location: [facility];  Service: Urology;  Laterality: Right;  1 HR FOR CASE   CYSTOSCOPY/URETEROSCOPY/HOLMIUM LASER/STENT PLACEMENT Right 07/17/2023   Procedure: CYSTOSCOPY/RIGHT URETEROSCOPY/STENT EXCHANGE;  Surgeon: Crista Elliot, MD;  Location:  [facility];  Service: Urology;  Laterality: Right;  60 MINS FOR CASE   EP IMPLANTABLE DEVICE N/A 06/16/2016   Procedure: Pacemaker Implant;  Surgeon: Will Jorja Loa, MD;  Location: [facility];  Service: Cardiovascular;  Laterality: N/A;   FOOT SURGERY  left foot   hammer toe   ILEOSTOMY  08/25/2011   ILEOSTOMY CLOSURE  04/02/2012   Procedure: ILEOSTOMY TAKEDOWN;  Surgeon: [name], MD;  Location: [facility];  Service: General;  Laterality: N/A;   INTRAOPERATIVE TRANSESOPHAGEAL ECHOCARDIOGRAM N/A 12/12/2013   Procedure: INTRAOPERATIVE TRANSESOPHAGEAL ECHOCARDIOGRAM;  Surgeon: Alleen Borne, MD;  Location: [facility];  Service: Open Heart Surgery;  Laterality: N/A;   LEFT AND RIGHT HEART CATHETERIZATION  WITH CORONARY ANGIOGRAM N/A 11/27/2013   Procedure: LEFT AND RIGHT HEART CATHETERIZATION WITH CORONARY ANGIOGRAM;  Surgeon: Thurmon Fair, MD;  Location: [facility] CATH [facility];  Service: Cardiovascular;  Laterality: N/A;   MITRAL VALVE REPLACEMENT N/A 12/12/2013   Procedure: MITRAL VALVE (MV) REPLACEMENT OR REPAIR;  Surgeon: Alleen Borne, MD; Service: Open Heart Surgery   TEE WITHOUT CARDIOVERSION N/A 11/29/2013   Procedure: TRANSESOPHAGEAL ECHOCARDIOGRAM (TEE);  Surgeon: Quintella Reichert, MD;  Location: [facility] ENDOSCOPY;  Service: Cardiovascular;  Laterality: N/A;   TEE WITHOUT CARDIOVERSION N/A 06/04/2019   Procedure: TRANSESOPHAGEAL ECHOCARDIOGRAM (TEE);  Surgeon: Thurmon Fair, MD;  Location: [facility] ENDOSCOPY;  Service: Cardiovascular;  Laterality: N/A;   TONSILLECTOMY     VIDEO BRONCHOSCOPY N/A 03/24/2021   Procedure: VIDEO BRONCHOSCOPY WITHOUT FLUORO;  Surgeon: [name], MD;  Location: [facility];  Service: Endoscopy;  Laterality: N/A;  SMALL SCOPE,PREMEDICATE WITH NEBULIZED LIDOCAINE   Social History:  reports that she quit smoking about 48 years ago. Her smoking use included cigarettes. She started smoking about 58 years ago. She has a 15 pack-year smoking history. She has never used smokeless tobacco. She reports that she does not drink alcohol and does not use drugs.  Allergies  Allergen Reactions   Codeine Nausea Only   Prednisone Nausea Only and Other (See Comments)    In different places all over the body.   Sulfa Antibiotics Nausea Only    Family History  Problem Relation Age of Onset   Heart disease Mother    Diabetes Mother    Hypertension Mother    Stroke Mother    Lung cancer Father    Breast cancer Maternal Aunt 106   Heart attack Maternal Grandfather    CAD Son 51   Heart attack Son 74   Rectal cancer Neg Hx    Stomach cancer Neg Hx    Esophageal cancer Neg Hx    Colon cancer Neg Hx     Prior to Admission medications   Medication Sig Start Date End Date Taking?  Authorizing Provider  acetaminophen (TYLENOL) 500 MG tablet Take 2 tablets (1,000 mg total) by mouth daily. 05/02/19   [name], MD  acidophilus (RISAQUAD) CAPS capsule Take 1 capsule by mouth daily.    [provider]  albuterol (VENTOLIN HFA) 108 (90 Base) MCG/ACT inhaler Inhale 2 puffs into the lungs every 6 (six) hours as needed for wheezing. 06/05/23   [name], MD  alendronate (FOSAMAX) 70 MG tablet Take 1 tablet (70 mg total) by mouth every 7 (seven) days. Take with a full glass of water on an empty stomach. 06/28/23   [name], MD  ascorbic acid (VITAMIN C) 500 MG tablet Take 1 tablet (500 mg total) by mouth daily. 10/05/22   [name], MD  Biotin 1000 MCG tablet Take 1,000 mcg by mouth daily.    [provider]  buPROPion (WELLBUTRIN SR) 150 MG 12 hr tablet TAKE 1 TABLET(150 MG) BY MOUTH EVERY MORNING 07/21/23   [name], MD  calcium carbonate (OSCAL) 1500 (600 Ca) MG TABS tablet Take 600 mg of elemental calcium by mouth daily.    [provider]  carboxymethylcellulose (REFRESH PLUS) 0.5 % SOLN Place 1 drop into both eyes 3 (three) times daily as needed (dry eyes).    [provider]  cephALEXin (KEFLEX) 250 MG capsule Take 250 mg by mouth 3 (three) times daily. 07/13/23   [provider]  cholecalciferol (VITAMIN D3) 25 MCG (1000 UNIT) tablet Take 1,000 Units by mouth daily.    [provider]  docusate sodium (COLACE) 100 MG capsule Take 200 mg by mouth daily.    [provider]  ELIQUIS 5 MG TABS tablet TAKE 1 TABLET(5 MG) BY MOUTH TWICE DAILY 08/24/23   Croitoru, Mihai, MD  Fluticasone-Umeclidin-Vilant (TRELEGY ELLIPTA) 200-62.5-25 MCG/ACT AEPB Inhale 1 puff into the lungs daily. 06/05/23   [name], MD  furosemide (LASIX) 40 MG tablet TAKE 1 TABLET BY MOUTH EVERY DAY. MAY TAKE AN EXTRA TABLET AS NEEDED FOR SWELLING 08/24/23   Croitoru, Rachelle Hora, MD  gabapentin (NEURONTIN) 100 MG  capsule Take 1 capsule (100 mg total) by mouth 2 (two) times daily as needed (back pain - for daytime use). 04/24/23   [name], MD  gabapentin (NEURONTIN) 300 MG capsule Take 300 mg by mouth at bedtime.    [provider]  hydrocortisone (ANUSOL-HC) 2.5 % rectal cream Place 1 Application rectally 2 (two) times daily. 04/24/23   [name], MD  ipratropium-albuterol (DUONEB) 0.5-2.5 (3) MG/3ML SOLN Take 3 mLs by nebulization every 6 (six) hours as needed. Patient not taking: Reported on 08/17/2023 11/14/14   Pecola Lawless, MD  lovastatin (MEVACOR) 40 MG tablet TAKE 1 TABLET(40 MG) BY MOUTH AT BEDTIME 07/21/23   [name], MD  montelukast (SINGULAIR) 10 MG tablet TAKE 1 TABLET(10 MG) BY MOUTH AT BEDTIME 02/13/23   [name], MD  Multiple Vitamins-Minerals (PRESERVISION AREDS 2 PO) Take 2 capsules by mouth every evening.     [provider]  ondansetron (ZOFRAN-ODT) 4 MG disintegrating tablet Take 1 tablet (4 mg total) by mouth every 8 (eight) hours as needed for nausea or vomiting. Patient not taking: Reported on 08/17/2023 07/03/23   Crista Elliot, MD  Phenazopyrid-Cranbry-C-Probiot (AZO URINARY TRACT SUPPORT PO) Take 1 capsule by mouth daily.    [provider]  polyethylene glycol (MIRALAX / GLYCOLAX) packet Take 17 g by mouth daily. Patient taking differently: Take 17 g by mouth daily as needed. 12/02/13   Dwana Melena, PA-C  potassium chloride (KLOR-CON) 10 MEQ tablet TAKE 2 TABLETS(20 MEQ) BY MOUTH DAILY 07/21/23   Croitoru, Mihai, MD  psyllium (REGULOID) 0.52 g capsule Take 0.52 g by mouth daily.    [provider]  traMADol (ULTRAM) 50 MG tablet Take 1 tablet (50 mg total) by mouth 2 (two) times daily as needed for moderate pain (pain score 4-6). 08/25/23   [name], MD  traZODone (DESYREL) 50 MG tablet TAKE 1/2 TO 1 TABLET(25 TO 50 MG) BY MOUTH AT BEDTIME 06/27/23   [name], MD  Vibegron (GEMTESA) 75 MG  TABS Take 75 mg by mouth daily.    [provider]  vitamin B-12 (CYANOCOBALAMIN) 500 MCG tablet Take 1 tablet (500 mcg total)  by mouth 2 (two) times a week. 01/06/22   [name], MD  Zinc 50 MG CAPS Take 1 capsule (50 mg total) by mouth daily. 11/04/19   [name], MD    Physical Exam: Vitals:   08/27/23 1030 08/27/23 1100 08/27/23 1130 08/27/23 1200  BP: (!) 151/76 (!) 158/79 (!) 141/79 138/85  Pulse: 70 73 78 68  Resp: (!) 21 (!) 21 (!) 23 19  Temp:      SpO2: 95% 95% 94% 100%  Weight:      Height:       Physical Exam Vitals and nursing note reviewed.  Constitutional:      General: She is awake. She is not in acute distress.    Appearance: She is well-developed. She is ill-appearing.     Interventions: Nasal cannula in place.  HENT:     Head: Normocephalic.     Nose: No rhinorrhea.     Mouth/Throat:     Mouth: Mucous membranes are moist.  Eyes:     Pupils: Pupils are equal, round, and reactive to light.  Neck:     Vascular: No JVD.  Cardiovascular:     Rate and Rhythm: Normal rate and regular rhythm.     Heart sounds: S1 normal and S2 normal.  Pulmonary:     Effort: No accessory muscle usage or respiratory distress.     Breath sounds: Decreased breath sounds, wheezing and rales present. No rhonchi.  Abdominal:     General: Bowel sounds are normal. There is no distension.     Palpations: Abdomen is soft.     Tenderness: There is no abdominal tenderness.  Musculoskeletal:     Cervical back: Neck supple.     Right lower leg: No edema.     Left lower leg: No edema.  Skin:    General: Skin is warm and dry.  Neurological:     General: No focal deficit present.     Mental Status: She is alert and oriented to person, place, and time.  Psychiatric:        Mood and Affect: Mood normal.        Behavior: Behavior normal. Behavior is cooperative.     Data Reviewed:  Results are pending, will review when available. 10/06/2022 transthoracic  echocardiogram IMPRESSIONS:   1. Left ventricular ejection fraction, by estimation, is 60 to 65%. The left ventricle has normal function. The left ventricle has no regional wall motion abnormalities. There is moderate concentric left ventricular hypertrophy. Left ventricular diastolic parameters are indeterminate.  2. Right ventricular systolic function is mildly reduced. The right ventricular size is moderately enlarged. There is moderately elevated pulmonary artery systolic pressure. The estimated right ventricular systolic pressure is 46.7 mmHg.  3. Left atrial size was severely dilated.  4. The mitral valve has been repaired/replaced. Trivial mitral valve regurgitation. There is a 25 mm Magna Ease bioprosthetic valve present in the mitral position. Procedure Date: 12/22/2013. The mean gradient is at HR 74, EOA 1.3cm2. Likely mild  prosthetic mitral stenosis.  5. The aortic valve has been repaired/replaced. There is a 21 mm bioprosthetic valve present in the aortic position. Procedure Date: 12/22/2013. Echo findings are consistent with normal structure and function of the aortic valve prosthesis. Aortic valve mean gradient measures 14.0 mmHg. Aortic valve Vmax measures 2.56 m/s. Trivial paravalvular leak  6. Aortic dilatation noted. There is mild dilatation of the ascending aorta, measuring 43 mm.  7. The inferior vena cava is dilated in size  with >50% respiratory variability, suggesting right atrial pressure of 8 mmHg.  Assessment and Plan: Principal Problem:   Acute heart failure with preserved ejection fraction (HFpEF) (HCC) Exacerbating her:   COPD (chronic obstructive pulmonary disease) Observation/telemetry. Supplemental oxygen as needed. Sodium and fluid restriction. Continue furosemide 20 mg IVP twice daily. Begin lisinopril 2.5 mg p.o. daily. Monitor daily weights, intake and output. Monitor renal function electrolytes. Check  echocardiogram. Methylprednisolone 125 mg IVP x1 in ED. Followed by methylprednisolone 40 mg IV daily in a.m. Scheduled and as needed bronchodilators. Follow-up CBC and chemistry in the morning.   Active Problems:   Persistent atrial fibrillation CHA?DS?-VASc Score of at least 6.    Peripheral neuropathy Continue gabapentin 100 mg the morning.   Continue gabapentin 300 mg p.o. nightly.    HLD (hyperlipidemia) Continue lovastatin or formulary equivalent.    Essential hypertension, benign On furosemide and low-dose lisinopril. Monitor BP, renal function electrolytes.    GERD (gastroesophageal reflux disease) Continue daily pantoprazole 40 mg daily.    Vitamin B12 deficiency Continue cyanocobalamin 1000 mcg p.o. daily.    Thrombocytopenia Monitor platelet count.     Advance Care Planning:   Code Status: Full Code   Consults:   Family Communication:   Severity of Illness: The appropriate patient status for this patient is OBSERVATION. Observation status is judged to be reasonable and necessary in order to provide the required intensity of service to ensure the patient's safety. The patient's presenting symptoms, physical exam findings, and initial radiographic and laboratory data in the context of their medical condition is felt to place them at decreased risk for further clinical deterioration. Furthermore, it is anticipated that the patient will be medically stable for discharge from the hospital within 2 midnights of admission.   Author: Bobette Mo, MD 08/27/2023 1:17 PM  For on call review [email or web address].   This document was prepared using Dragon voice recognition software and may contain some unintended transcription errors.

## 2023-08-27 NOTE — ED Triage Notes (Addendum)
Pt c/o SOB x3 days.  Pt denies new pain.  Hx of COPD.  Pt report SOB increases w/ exertion.    Pt reports using an at-home pulse ox and getting readings in the 70s-80s.   Pt reports bone marrow procedure x4 days ago, prior to increased SOB.

## 2023-08-27 NOTE — ED Notes (Addendum)
Bed in low locked position call light in reach

## 2023-08-27 NOTE — ED Provider Notes (Signed)
[place] EMERGENCY DEPARTMENT AT [facility] Provider Note   CSN: [id number] Arrival date & time: 08/27/23  1007     History  Chief Complaint  Patient presents with   Shortness of Breath    [name] is a 77 y.o. female.  77 year old female with prior medical history of PAF/a-flutter on anticoagulation, chronic diastolic heart failure, heart block s/p PPM, ascending aortic aneurysm, carotid stenosis, MVR with MV stenosis, AVR, pulmonary hypertension, COPD, hypertension, hyperlipidemia, GERD, CKD presents with complaint of increasing shortness of breath.  Patient reports exertional dyspnea x 3 to 4 days.  Patient reports that at rest she is fine.  She reports that she ambulates around her house she felt short of breath.  She has a home pulse ox and reportedly with ambulation her sats on room air were dropping into the 80s per report.  The history is provided by the patient and medical records.       Home Medications Prior to Admission medications   Medication Sig Start Date End Date Taking? Authorizing Provider  acetaminophen (TYLENOL) 500 MG tablet Take 2 tablets (1,000 mg total) by mouth daily. 05/02/19   [name], MD  acidophilus (RISAQUAD) CAPS capsule Take 1 capsule by mouth daily.    [provider]  albuterol (VENTOLIN HFA) 108 (90 Base) MCG/ACT inhaler Inhale 2 puffs into the lungs every 6 (six) hours as needed for wheezing. 06/05/23   [name], MD  alendronate (FOSAMAX) 70 MG tablet Take 1 tablet (70 mg total) by mouth every 7 (seven) days. Take with a full glass of water on an empty stomach. 06/28/23   [name], MD  ascorbic acid (VITAMIN C) 500 MG tablet Take 1 tablet (500 mg total) by mouth daily. 10/05/22   [name], MD  Biotin 1000 MCG tablet Take 1,000 mcg by mouth daily.    [provider]  buPROPion (WELLBUTRIN SR) 150 MG 12 hr tablet TAKE 1 TABLET(150 MG) BY MOUTH EVERY MORNING 07/21/23    [name], MD  calcium carbonate (OSCAL) 1500 (600 Ca) MG TABS tablet Take 600 mg of elemental calcium by mouth daily.    [provider]  carboxymethylcellulose (REFRESH PLUS) 0.5 % SOLN Place 1 drop into both eyes 3 (three) times daily as needed (dry eyes).    [provider]  cephALEXin (KEFLEX) 250 MG capsule Take 250 mg by mouth 3 (three) times daily. 07/13/23   [provider]  cholecalciferol (VITAMIN D3) 25 MCG (1000 UNIT) tablet Take 1,000 Units by mouth daily.    [provider]  docusate sodium (COLACE) 100 MG capsule Take 200 mg by mouth daily.    [provider]  ELIQUIS 5 MG TABS tablet TAKE 1 TABLET(5 MG) BY MOUTH TWICE DAILY 08/24/23   Croitoru, Mihai, MD  Fluticasone-Umeclidin-Vilant (TRELEGY ELLIPTA) 200-62.5-25 MCG/ACT AEPB Inhale 1 puff into the lungs daily. 06/05/23   [name], MD  furosemide (LASIX) 40 MG tablet TAKE 1 TABLET BY MOUTH EVERY DAY. MAY TAKE AN EXTRA TABLET AS NEEDED FOR SWELLING 08/24/23   Croitoru, Rachelle Hora, MD  gabapentin (NEURONTIN) 100 MG capsule Take 1 capsule (100 mg total) by mouth 2 (two) times daily as needed (back pain - for daytime use). 04/24/23   [name], MD  gabapentin (NEURONTIN) 300 MG capsule Take 300 mg by mouth at bedtime.    [provider]  hydrocortisone (ANUSOL-HC) 2.5 % rectal cream Place 1 Application rectally 2 (two) times daily. 04/24/23  [name], MD  ipratropium-albuterol (DUONEB) 0.5-2.5 (3) MG/3ML SOLN Take 3 mLs by nebulization every 6 (six) hours as needed. Patient not taking: Reported on 08/17/2023 11/14/14   Pecola Lawless, MD  lovastatin (MEVACOR) 40 MG tablet TAKE 1 TABLET(40 MG) BY MOUTH AT BEDTIME 07/21/23   [name], MD  montelukast (SINGULAIR) 10 MG tablet TAKE 1 TABLET(10 MG) BY MOUTH AT BEDTIME 02/13/23   [name], MD  Multiple Vitamins-Minerals (PRESERVISION AREDS 2 PO) Take 2 capsules by mouth every evening.     [provider]  ondansetron (ZOFRAN-ODT) 4 MG disintegrating tablet Take 1 tablet (4 mg total) by mouth every 8 (eight) hours as needed for nausea or vomiting. Patient not taking: Reported on 08/17/2023 07/03/23   Crista Elliot, MD  Phenazopyrid-Cranbry-C-Probiot (AZO URINARY TRACT SUPPORT PO) Take 1 capsule by mouth daily.    [provider]  polyethylene glycol (MIRALAX / GLYCOLAX) packet Take 17 g by mouth daily. Patient taking differently: Take 17 g by mouth daily as needed. 12/02/13   Dwana Melena, PA-C  potassium chloride (KLOR-CON) 10 MEQ tablet TAKE 2 TABLETS(20 MEQ) BY MOUTH DAILY 07/21/23   Croitoru, Mihai, MD  psyllium (REGULOID) 0.52 g capsule Take 0.52 g by mouth daily.    [provider]  traMADol (ULTRAM) 50 MG tablet Take 1 tablet (50 mg total) by mouth 2 (two) times daily as needed for moderate pain (pain score 4-6). 08/25/23   [name], MD  traZODone (DESYREL) 50 MG tablet TAKE 1/2 TO 1 TABLET(25 TO 50 MG) BY MOUTH AT BEDTIME 06/27/23   [name], MD  Vibegron (GEMTESA) 75 MG TABS Take 75 mg by mouth daily.    [provider]  vitamin B-12 (CYANOCOBALAMIN) 500 MCG tablet Take 1 tablet (500 mcg total) by mouth 2 (two) times a week. 01/06/22   [name], MD  Zinc 50 MG CAPS Take 1 capsule (50 mg total) by mouth daily. 11/04/19   [name], MD      Allergies    Codeine, Prednisone, and Sulfa antibiotics    Review of Systems   Review of Systems  All other systems reviewed and are negative.   Physical Exam Updated Vital Signs BP (!) 149/78   Pulse 70   Temp 98 F (36.7 C)   Resp 18   Ht 5\' 4"  (1.626 m)   Wt 68.9 kg   SpO2 96%   BMI 26.09 kg/m  Physical Exam Vitals and nursing note reviewed.  Constitutional:      General: She is not in acute distress.    Appearance: Normal appearance. She is well-developed.  HENT:     Head: Normocephalic and atraumatic.  Eyes:     Conjunctiva/sclera: Conjunctivae  normal.     Pupils: Pupils are equal, round, and reactive to light.  Cardiovascular:     Rate and Rhythm: Normal rate and regular rhythm.     Heart sounds: Normal heart sounds.  Pulmonary:     Effort: Pulmonary effort is normal. No respiratory distress.     Comments: Decreased breath sounds at bilateral bases Abdominal:     General: There is no distension.     Palpations: Abdomen is soft.     Tenderness: There is no abdominal tenderness.  Musculoskeletal:        General: No deformity. Normal range of motion.     Cervical back: Normal range of motion and neck supple.  Skin:    General: Skin is warm  and dry.  Neurological:     General: No focal deficit present.     Mental Status: She is alert and oriented to person, place, and time.     ED Results / Procedures / Treatments   Labs (all labs ordered are listed, but only abnormal results are displayed) Labs Reviewed - No data to display  EKG None  Radiology No results found.  Procedures Procedures    Medications Ordered in ED Medications - No data to display  ED Course/ Medical Decision Making/ A&P                                 Medical Decision Making Amount and/or Complexity of Data Reviewed Labs: ordered. Radiology: ordered.  Risk Prescription drug management. Decision regarding hospitalization.    Medical Screen Complete  This patient presented to the ED with complaint of dyspnea.  This complaint involves an extensive number of treatment options. The initial differential diagnosis includes, but is not limited to, CHF exacerbation, CHF, metabolic abnormality  This presentation is: Acute, Chronic, Self-Limited, Previously Undiagnosed, Uncertain Prognosis, Complicated, Systemic Symptoms, and Threat to Life/Bodily Function  Patient with multiple comorbidities presents for evaluation of gradually worsening dyspnea with exertion.  Workup is suggestive of both likely combination of CHF exacerbation and COPD  exacerbation.  Patient would benefit for admission.  [facility] cardiology is aware of case and will evaluate in consultation.  Hospitalist service made aware of case and evaluate for admission.   Co morbidities that complicated the patient's evaluation  PAF/a-flutter on anticoagulation, chronic diastolic heart failure, heart block s/p PPM, ascending aortic aneurysm, carotid stenosis, MVR with MV stenosis, AVR, pulmonary hypertension, COPD, hypertension, hyperlipidemia, GERD, CKD   Additional history obtained: External records from outside sources obtained and reviewed including prior ED visits and prior Inpatient records.    Lab Tests:  I ordered and personally interpreted labs.  The pertinent results include: CBC, CMP, COVID, BNP, troponin   Imaging Studies ordered:  I ordered imaging studies including chest x-ray I independently visualized and interpreted obtained imaging which showed vascular congestion cardiomegaly I agree with the radiologist interpretation.   Cardiac Monitoring:  The patient was maintained on a cardiac monitor.  I personally viewed and interpreted the cardiac monitor which showed an underlying rhythm of: A-fib   Problem List / ED Course:  Dyspnea   Reevaluation:  After the interventions noted above, I reevaluated the patient and found that they have: improved   Disposition:  After consideration of the diagnostic results and the patients response to treatment, I feel that the patent would benefit from admission.          Final Clinical Impression(s) / ED Diagnoses Final diagnoses:  Dyspnea, unspecified type    Rx / DC Orders ED Discharge Orders     None         Wynetta Fines, MD 08/27/23 1453

## 2023-08-27 NOTE — Plan of Care (Signed)
  Problem: Clinical Measurements: Goal: Ability to maintain clinical measurements within normal limits will improve Outcome: Progressing Goal: Respiratory complications will improve Outcome: Progressing   Problem: Activity: Goal: Risk for activity intolerance will decrease Outcome: Progressing   Problem: Nutrition: Goal: Adequate nutrition will be maintained Outcome: Progressing

## 2023-08-27 NOTE — ED Notes (Signed)
[facility] at bedside. 

## 2023-08-27 NOTE — ED Notes (Signed)
Up to [facility].

## 2023-08-28 ENCOUNTER — Observation Stay (HOSPITAL_COMMUNITY): Payer: Medicare Other

## 2023-08-28 ENCOUNTER — Ambulatory Visit: Payer: Medicare Other | Admitting: Physical Therapy

## 2023-08-28 DIAGNOSIS — I700 Atherosclerosis of aorta: Secondary | ICD-10-CM | POA: Diagnosis present

## 2023-08-28 DIAGNOSIS — I4892 Unspecified atrial flutter: Secondary | ICD-10-CM | POA: Diagnosis present

## 2023-08-28 DIAGNOSIS — K219 Gastro-esophageal reflux disease without esophagitis: Secondary | ICD-10-CM | POA: Diagnosis present

## 2023-08-28 DIAGNOSIS — R0602 Shortness of breath: Secondary | ICD-10-CM | POA: Diagnosis present

## 2023-08-28 DIAGNOSIS — D631 Anemia in chronic kidney disease: Secondary | ICD-10-CM | POA: Diagnosis present

## 2023-08-28 DIAGNOSIS — I10 Essential (primary) hypertension: Secondary | ICD-10-CM

## 2023-08-28 DIAGNOSIS — J449 Chronic obstructive pulmonary disease, unspecified: Secondary | ICD-10-CM

## 2023-08-28 DIAGNOSIS — Z952 Presence of prosthetic heart valve: Secondary | ICD-10-CM | POA: Diagnosis not present

## 2023-08-28 DIAGNOSIS — Z8616 Personal history of COVID-19: Secondary | ICD-10-CM | POA: Diagnosis not present

## 2023-08-28 DIAGNOSIS — I5033 Acute on chronic diastolic (congestive) heart failure: Secondary | ICD-10-CM | POA: Diagnosis present

## 2023-08-28 DIAGNOSIS — J441 Chronic obstructive pulmonary disease with (acute) exacerbation: Secondary | ICD-10-CM | POA: Diagnosis present

## 2023-08-28 DIAGNOSIS — E785 Hyperlipidemia, unspecified: Secondary | ICD-10-CM

## 2023-08-28 DIAGNOSIS — G629 Polyneuropathy, unspecified: Secondary | ICD-10-CM | POA: Diagnosis present

## 2023-08-28 DIAGNOSIS — Q2112 Patent foramen ovale: Secondary | ICD-10-CM | POA: Diagnosis not present

## 2023-08-28 DIAGNOSIS — I442 Atrioventricular block, complete: Secondary | ICD-10-CM | POA: Diagnosis present

## 2023-08-28 DIAGNOSIS — Z23 Encounter for immunization: Secondary | ICD-10-CM | POA: Diagnosis present

## 2023-08-28 DIAGNOSIS — E538 Deficiency of other specified B group vitamins: Secondary | ICD-10-CM | POA: Diagnosis present

## 2023-08-28 DIAGNOSIS — I4819 Other persistent atrial fibrillation: Secondary | ICD-10-CM | POA: Diagnosis present

## 2023-08-28 DIAGNOSIS — D696 Thrombocytopenia, unspecified: Secondary | ICD-10-CM

## 2023-08-28 DIAGNOSIS — T82867A Thrombosis of cardiac prosthetic devices, implants and grafts, initial encounter: Secondary | ICD-10-CM | POA: Diagnosis present

## 2023-08-28 DIAGNOSIS — I342 Nonrheumatic mitral (valve) stenosis: Secondary | ICD-10-CM | POA: Diagnosis not present

## 2023-08-28 DIAGNOSIS — I130 Hypertensive heart and chronic kidney disease with heart failure and stage 1 through stage 4 chronic kidney disease, or unspecified chronic kidney disease: Secondary | ICD-10-CM | POA: Diagnosis present

## 2023-08-28 DIAGNOSIS — N182 Chronic kidney disease, stage 2 (mild): Secondary | ICD-10-CM | POA: Diagnosis present

## 2023-08-28 DIAGNOSIS — Z1152 Encounter for screening for COVID-19: Secondary | ICD-10-CM | POA: Diagnosis not present

## 2023-08-28 DIAGNOSIS — I4891 Unspecified atrial fibrillation: Secondary | ICD-10-CM | POA: Diagnosis not present

## 2023-08-28 DIAGNOSIS — I5031 Acute diastolic (congestive) heart failure: Secondary | ICD-10-CM | POA: Diagnosis not present

## 2023-08-28 DIAGNOSIS — Y831 Surgical operation with implant of artificial internal device as the cause of abnormal reaction of the patient, or of later complication, without mention of misadventure at the time of the procedure: Secondary | ICD-10-CM | POA: Diagnosis present

## 2023-08-28 DIAGNOSIS — Z7901 Long term (current) use of anticoagulants: Secondary | ICD-10-CM | POA: Diagnosis not present

## 2023-08-28 DIAGNOSIS — G6289 Other specified polyneuropathies: Secondary | ICD-10-CM

## 2023-08-28 DIAGNOSIS — I2721 Secondary pulmonary arterial hypertension: Secondary | ICD-10-CM | POA: Diagnosis present

## 2023-08-28 DIAGNOSIS — T82857A Stenosis of cardiac prosthetic devices, implants and grafts, initial encounter: Secondary | ICD-10-CM | POA: Diagnosis present

## 2023-08-28 DIAGNOSIS — I483 Typical atrial flutter: Secondary | ICD-10-CM | POA: Diagnosis not present

## 2023-08-28 DIAGNOSIS — J9811 Atelectasis: Secondary | ICD-10-CM | POA: Diagnosis present

## 2023-08-28 LAB — CBC
HCT: 33 % — ABNORMAL LOW (ref 36.0–46.0)
Hemoglobin: 10.5 g/dL — ABNORMAL LOW (ref 12.0–15.0)
MCH: 32.2 pg (ref 26.0–34.0)
MCHC: 31.8 g/dL (ref 30.0–36.0)
MCV: 101.2 fL — ABNORMAL HIGH (ref 80.0–100.0)
Platelets: 88 10*3/uL — ABNORMAL LOW (ref 150–400)
RBC: 3.26 MIL/uL — ABNORMAL LOW (ref 3.87–5.11)
RDW: 14.2 % (ref 11.5–15.5)
WBC: 3.5 10*3/uL — ABNORMAL LOW (ref 4.0–10.5)
nRBC: 0 % (ref 0.0–0.2)

## 2023-08-28 LAB — COMPREHENSIVE METABOLIC PANEL
ALT: 10 U/L (ref 0–44)
AST: 16 U/L (ref 15–41)
Albumin: 3 g/dL — ABNORMAL LOW (ref 3.5–5.0)
Alkaline Phosphatase: 62 U/L (ref 38–126)
Anion gap: 6 (ref 5–15)
BUN: 20 mg/dL (ref 8–23)
CO2: 31 mmol/L (ref 22–32)
Calcium: 9.1 mg/dL (ref 8.9–10.3)
Chloride: 99 mmol/L (ref 98–111)
Creatinine, Ser: 0.89 mg/dL (ref 0.44–1.00)
GFR, Estimated: >60 mL/min (ref >60)
Glucose, Bld: 125 mg/dL — ABNORMAL HIGH (ref 70–99)
Potassium: 4.3 mmol/L (ref 3.5–5.1)
Sodium: 136 mmol/L (ref 135–145)
Total Bilirubin: 0.6 mg/dL (ref <1.2)
Total Protein: 6.4 g/dL — ABNORMAL LOW (ref 6.5–8.1)

## 2023-08-28 LAB — ECHOCARDIOGRAM COMPLETE
AR max vel: 1.52 cm2
AV Area VTI: 1.44 cm2
AV Area mean vel: 1.32 cm2
AV Mean grad: 13 mm[Hg]
AV Peak grad: 20.8 mm[Hg]
Ao pk vel: 2.28 m/s
Area-P 1/2: 2.55 cm2
Est EF: >75
Height: 64 in
MV VTI: 0.8 cm2
S' Lateral: 1.6 cm
Weight: 2384.5 [oz_av]

## 2023-08-28 NOTE — Consult Note (Signed)
Cardiology Consultation   Patient ID: [name] MRN: [id number]; DOB: [birth year]  Admit date: 08/27/2023 Date of Consult: 08/28/2023  PCP:  Diane Boyden, MD   [facility] Providers Cardiologist:  Diane Fair, MD        Patient Profile:   [name] is a 77 y.o. 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 Robb [name].  History of Present Illness:   [name] presented to the ED for evaluation of progressive dyspnea, particularly on exertion with wheezing as well as O2 desaturation (also on exertion). Patient reports these symptoms have been ongoing for the past 2 weeks but she didn't start checking O2 level until last week. She also reports significant fatigue at baseline in the last couple of weeks. Denies recent LE edema, orthopnea/PND, chest pain. Reports some palpitations with exertion. In the ED, CXR with cardiomegaly and mild interstitial edema consistent with CHF as well as left>right pleural effusions. Troponin 121->136. Resp virus panel negative. BNP 293.9.  On exam today, patient reports mild improvement in dyspnea on exertion with diuresis. She is generally concerned about low energy levels and fatigue.   Past Medical History:  Diagnosis Date   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 12/31/2016   Acute cystitis without hematuria  12/22/2015   Acute right-sided low back pain without sciatica 11/26/2019   Saw [name] 12/2019 - planned L2/3 translaminar epidural steroid injection. Known DISH with thoracic spine fusion and moderate kyphosis, h/o L spine fusion L2-5   Adjustment disorder with depressed mood 10/31/2014   64 yo son died MI 10/18/2014 Husband died car accident 04/19/15   Anemia    Anticoagulant long-term use 06/14/2019   She takes warfarin for afib/flutter with CHADS2VASc score of 4 (age, sex, CHF, HTN), s/p 2 bioprosthetic valve replacements Coumadin changed to eliquis 2022 per cardiology   Aortic atherosclerosis 03/03/2022   Noted on 01/2022 chest CT   Arthritis    Arthropathy of spinal facet joint 03/08/2018   Ascending aortic aneurysm 03/02/2021   4.1cm on CT 01/2021 rec yearly monitoring   Bilateral hip pain 09/08/2016   Cancer (HCC)    conon cancer 2012   CAP (community acquired pneumonia) 02/08/2016   Carotid stenosis 12/08/2020   Carotid US - 40-59% BICA stenosis (2015) Carotid US 12/2020 - 1-39% BICA stenosis, incidental 3.4cm structure behind L common carotid artery rec further imaging    Cataracts, bilateral    immature   Chest pain 08/25/2014   myoview low risk 02/2018 ([place])   Chronic cholecystitis with calculus 01/21/2015   S/p cholecystectomy    Chronic diastolic heart failure 09/19/2014   Chronic insomnia    Chronic lower back pain 03/05/2008   Returned to Dr [name] - translaminar ESI at L3/4. Myelogram showed significant left sided foraminal stenosis at L2/3 and L3/4 in setting of solid arthrodesis, planned DEXA and if stable osteopenia, considering anterolateral decompression with spacer.    CKD (chronic kidney disease) stage 2, GFR  60-89 ml/min 07/03/2015   Constipation    takes Miralax daily as needed   COPD (chronic obstructive pulmonary disease)    Albuterol inhaler daily as needed;Duoneb daily as needed;Spiriva daily   COVID-19 virus infection 11/11/2022   DDD (degenerative disc  disease)    cervical - kyphosis with mod DD changes C5/6 and C6/7; lumbar - early DD at L2/3 ([name])   Depression    DISH (diffuse idiopathic skeletal hyperostosis) 05/14/2019   By MRI noted fusion of lower thoracic vertebrae - likely contributing to unsteadiness ([name])   Dyspnea on exertion 06/05/2015   myoview low risk 02/2018   Edema of both lower legs due to peripheral venous insufficiency    Essential hypertension, benign 03/24/2009   hx of not on nmeds currenlty and has been a while since on meds per pt   Heart murmur    Hematuria 06/17/2021   History of kidney stones    History of radiation therapy 05/30/11 to 07/07/11   rectum   History of rectal cancer 05/26/2011   Rectal cancer 2012, midrectum [id number], s/p neoadj chemo&XRT, lap LAR with diverting loop ileostomy 08/2011, ileostomy takedown 03/2012 Discharged from oncology clinic 2017 [name])   History of shingles    History of vertebral fracture 11/26/2019   Old L1 vertebral compression fracture incidentally noted on imaging 2020    Hyperlipemia    takes Lovastatin daily   Lacunar infarction    Left caudate   Left hip pain 07/29/2020   Legally blind in left eye    Lymphangioma 01/07/2021   Noted incidentally on carotid US 12/2020 CT neck - Likely benign lymphangioma 01/2021   Macrocytosis 03/27/2022   Folate and b12 checked 2023   Malaise and fatigue 01/22/2018   Meralgia paresthetica 12/13/2007   Mixed stress and urge urinary incontinence 03/16/2015   Failed oxybutynin IR/ER. Vesicare initially helped but then no longer helpful.  myrbetriq was not effective.  Saw urology ([name]) - UDS largely overactive bladder with mild-mod stress incontinence. Improved on abx course - maintained on [name].    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   Pulmonary infiltrates 04/23/2019   06/13/2018-CT super D chest without contrast- generally stable chronic lung disease with pleural parenchymal scarring and scattered nodularity most of these nodules have been tree-in-bud in appearance likely postinfectious or inflammatory the dominant right lower lobe nodule seen on most recent study has resolved no new or enlarging nodules  04/22/2019-CT chest without contrast- waxing and waning per   Pulmonary nodule 02/23/2012   RLL nodule-70mm stable 2006, April 2009, and June 2009 Small pulm nodules Jan 2012 - >  Oct 2012 without change   Rheumatic heart disease mitral stenosis    mod MS by echo 02/2014   Right leg swelling 06/11/2020   R venous US 06/2020 WNL: - No evidence of deep vein thrombosis in the lower extremity. No indirect evidence of obstruction proximal to the inguinal ligament. - No cystic structure found in the popliteal fossa.   S/P aortic and mitral valve bioprostheses 12/08/2013   AVR 21 mm, MVR 25 mm-MagnaEase pericardia   S/P AVR (aortic valve replacement) 2015   bioprosthetic (Bartle)   S/P MVR (mitral valve replacement) 2015   bioprosthetic (Bartle)   Sepsis secondary to UTI (HCC)  08/22/2017   Severe aortic valve stenosis 10/01/2013   mild-mod by echo 02/2014   Skin lesion 01/03/2022   Small bowel obstruction, partial 08/2013   reolved without NGT placement.    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   Dr Amy Swaziland   Syncope 05/28/2018   Thrombocytopenia 11/04/2019   Typical atrial flutter 05/15/2019   Vitamin B12 deficiency 02/28/2015   Start B12 shots 02/2015    Vitamin D deficiency 10/23/2016   Wounds, multiple    bilateral legs    Past Surgical History:  Procedure Laterality Date   ANTERIOR LAT LUMBAR FUSION Left 07/17/2018   Procedure: Lumbar Two-Three Lumbar Three-Four Anterolateral decompression/interbody fusion with lateral plate fixation/Infuse;  Surgeon: Barnett Abu, MD;  Location: [facility] OR;  Service: Neurosurgery;  Laterality: Left;  Lumbar Two-Three Lumbar Three-Four Anterolateral decompression/interbody fusion with lateral plate fixation/Infuse   AORTIC VALVE REPLACEMENT N/A 12/12/2013   Procedure: AORTIC VALVE REPLACEMENT (AVR);  Surgeon: Alleen Borne, MD; Service: Open Heart Surgery   BACK SURGERY  2006, 2007   2006 SPACER, 2007 decompression and fusion L4/5   BOWEL RESECTION  04/02/2012   Procedure: SMALL BOWEL RESECTION;  Surgeon: [name], MD;  Location: [facility];  Service: General;  Laterality: N/A;   BRONCHIAL WASHINGS  03/24/2021   Procedure: BRONCHIAL WASHINGS;  Surgeon: [name], MD;  Location: [facility] END[facility];  Service: Endoscopy;;   CARPAL TUNNEL RELEASE Bilateral    CATARACT EXTRACTION W/ INTRAOCULAR LENS IMPLANT Left 10/18/2016   CATARACT EXTRACTION W/ INTRAOCULAR LENS IMPLANT Right 12/13/2016   Dr. Dagoberto Ligas   CHOLECYSTECTOMY N/A 01/21/2015   chronic cholecystitis, [name], MD   COLON RESECTION  08/25/2011   Procedure: COLON RESECTION LAPAROSCOPIC;  Surgeon: [name], MD;  Location: [facility];  Service: General;  Laterality: N/A;  Laparoscopic Assisted Low Anterior Resection Diverting Ostomy  and onQ pain pump   COLONOSCOPY  03/2013   1 polyp, rpt 3 yrs Christella Hartigan)   COLONOSCOPY  05/2021   diverticulosis and patent colon anastomosis with some edema/narrowing, rpt 5 yrs Christella Hartigan)   COLONOSCOPY WITH PROPOFOL N/A 06/09/2016   patent colo-colonic anastomosis, rpt 5 yrs Christella Hartigan)   CYSTOSCOPY/URETEROSCOPY/HOLMIUM LASER/STENT PLACEMENT Right 07/03/2023   Procedure: CYSTOSCOPY RIGHT RETROGRADE PYEPLGRAM RIGHT URETEROSCOPY/HOLMIUM LASER/STENT PLACEMENT;  Surgeon: Crista Elliot, MD;  Location: [facility];  Service: Urology;  Laterality: Right;  1 HR FOR CASE   CYSTOSCOPY/URETEROSCOPY/HOLMIUM LASER/STENT PLACEMENT Right 07/17/2023   Procedure: CYSTOSCOPY/RIGHT URETEROSCOPY/STENT EXCHANGE;  Surgeon: Crista Elliot, MD;  Location: [facility];  Service: Urology;  Laterality: Right;  60 MINS FOR CASE   EP IMPLANTABLE DEVICE N/A 06/16/2016   Procedure: Pacemaker Implant;  Surgeon: Will Jorja Loa, MD;  Location: [facility];  Service: Cardiovascular;  Laterality: N/A;   FOOT SURGERY  left foot   hammer toe  ILEOSTOMY  08/25/2011   ILEOSTOMY CLOSURE  04/02/2012   Procedure: ILEOSTOMY TAKEDOWN;  Surgeon: [name], MD;  Location: [facility];  Service: General;  Laterality: N/A;   INTRAOPERATIVE TRANSESOPHAGEAL ECHOCARDIOGRAM N/A 12/12/2013   Procedure: INTRAOPERATIVE TRANSESOPHAGEAL ECHOCARDIOGRAM;  Surgeon: Alleen Borne, MD;  Location: [facility];  Service: Open Heart Surgery;  Laterality: N/A;   LEFT AND RIGHT HEART CATHETERIZATION WITH CORONARY ANGIOGRAM N/A 11/27/2013   Procedure: LEFT AND RIGHT HEART CATHETERIZATION WITH CORONARY ANGIOGRAM;  Surgeon: Diane Fair, MD;  Location: [facility] CATH [facility];  Service: Cardiovascular;  Laterality: N/A;   MITRAL VALVE REPLACEMENT N/A 12/12/2013   Procedure: MITRAL VALVE (MV) REPLACEMENT OR REPAIR;  Surgeon: Alleen Borne, MD; Service: Open Heart Surgery   TEE WITHOUT CARDIOVERSION N/A 11/29/2013   Procedure: TRANSESOPHAGEAL ECHOCARDIOGRAM (TEE);  Surgeon:  Quintella Reichert, MD;  Location: [facility] ENDOSCOPY;  Service: Cardiovascular;  Laterality: N/A;   TEE WITHOUT CARDIOVERSION N/A 06/04/2019   Procedure: TRANSESOPHAGEAL ECHOCARDIOGRAM (TEE);  Surgeon: Diane Fair, MD;  Location: [facility] ENDOSCOPY;  Service: Cardiovascular;  Laterality: N/A;   TONSILLECTOMY     VIDEO BRONCHOSCOPY N/A 03/24/2021   Procedure: VIDEO BRONCHOSCOPY WITHOUT FLUORO;  Surgeon: [name], MD;  Location: [facility];  Service: Endoscopy;  Laterality: N/A;  SMALL SCOPE,PREMEDICATE WITH NEBULIZED LIDOCAINE     Home Medications:  Prior to Admission medications   Medication Sig Start Date End Date Taking? Authorizing Provider  acetaminophen (TYLENOL) 500 MG tablet Take 2 tablets (1,000 mg total) by mouth daily. Patient taking differently: Take 1,000 mg by mouth in the morning. 05/02/19  Yes Diane Boyden, MD  acidophilus [facility]) CAPS capsule Take 1 capsule by mouth daily.   Yes [provider]  albuterol (VENTOLIN HFA) 108 (90 Base) MCG/ACT inhaler Inhale 2 puffs into the lungs every 6 (six) hours as needed for wheezing. 06/05/23  Yes [name], MD  amoxicillin (AMOXIL) 500 MG tablet Take 2,000 mg by mouth See admin instructions. Take 2,000 mg by mouth one hour prior to dental treatments   Yes [provider]  ascorbic acid (VITAMIN C) 500 MG tablet Take 1 tablet (500 mg total) by mouth daily. 10/05/22  Yes Diane Boyden, MD  Biotin 1000 MCG tablet Take 1,000 mcg by mouth daily.   Yes [provider]  buPROPion (WELLBUTRIN SR) 150 MG 12 hr tablet TAKE 1 TABLET(150 MG) BY MOUTH EVERY MORNING Patient taking differently: Take 150 mg by mouth in the morning. 07/21/23  Yes Diane Boyden, MD  calcium carbonate (OSCAL) 1500 (600 Ca) MG TABS tablet Take 600 mg of elemental calcium by mouth daily.   Yes [provider]  carboxymethylcellulose (REFRESH PLUS) 0.5 % SOLN Place 1 drop into both eyes 3 (three) times daily as needed  (dry eyes).   Yes [provider]  cephALEXin (KEFLEX) 250 MG capsule Take 250 mg by mouth daily with breakfast. 07/13/23  Yes [provider]  cholecalciferol (VITAMIN D3) 25 MCG (1000 UNIT) tablet Take 1,000 Units by mouth daily.   Yes [provider]  docusate sodium (COLACE) 100 MG capsule Take 200 mg by mouth daily.   Yes [provider]  ELIQUIS 5 MG TABS tablet TAKE 1 TABLET(5 MG) BY MOUTH TWICE DAILY Patient taking differently: Take 5 mg by mouth in the morning and at bedtime. 08/24/23  Yes Croitoru, Mihai, MD  Fluticasone-Umeclidin-Vilant (TRELEGY ELLIPTA) 200-62.5-25 MCG/ACT AEPB Inhale 1 puff into the lungs daily. 06/05/23  Yes [name], MD  furosemide (LASIX) 40 MG tablet  TAKE 1 TABLET BY MOUTH EVERY DAY. MAY TAKE AN EXTRA TABLET AS NEEDED FOR SWELLING Patient taking differently: Take 40 mg by mouth in the morning. 08/24/23  Yes Croitoru, Mihai, MD  gabapentin (NEURONTIN) 100 MG capsule Take 1 capsule (100 mg total) by mouth 2 (two) times daily as needed (back pain - for daytime use). Patient taking differently: Take 100 mg by mouth daily. 04/24/23  Yes Diane Boyden, MD  gabapentin (NEURONTIN) 300 MG capsule Take 300 mg by mouth at bedtime.   Yes [provider]  hydrocortisone (ANUSOL-HC) 2.5 % rectal cream Place 1 Application rectally 2 (two) times daily. Patient taking differently: Place 1 Application rectally 2 (two) times daily as needed for hemorrhoids (or pain). 04/24/23  Yes Diane Boyden, MD  ipratropium-albuterol (DUONEB) 0.5-2.5 (3) MG/3ML SOLN Take 3 mLs by nebulization every 6 (six) hours as needed. Patient taking differently: Take 3 mLs by nebulization every 6 (six) hours as needed (for wheezing or shortness of breath). 11/14/14  Yes Pecola Lawless, MD  lovastatin (MEVACOR) 40 MG tablet TAKE 1 TABLET(40 MG) BY MOUTH AT BEDTIME Patient taking differently: Take 40 mg by mouth daily. 07/21/23  Yes Diane Boyden,  MD  montelukast (SINGULAIR) 10 MG tablet TAKE 1 TABLET(10 MG) BY MOUTH AT BEDTIME Patient taking differently: Take 10 mg by mouth daily. 02/13/23  Yes [name], MD  Multiple Vitamins-Minerals (PRESERVISION AREDS 2 PO) Take 2 capsules by mouth in the morning.   Yes [provider]  ondansetron (ZOFRAN-ODT) 4 MG disintegrating tablet Take 1 tablet (4 mg total) by mouth every 8 (eight) hours as needed for nausea or vomiting. Patient taking differently: Take 4 mg by mouth every 8 (eight) hours as needed for nausea or vomiting (DISSOLVE ORALLY). 07/03/23  Yes Crista Elliot, MD  Phenazopyrid-Cranbry-C-Probiot (AZO URINARY TRACT SUPPORT PO) Take 1 capsule by mouth daily.   Yes [provider]  polyethylene glycol (MIRALAX / GLYCOLAX) packet Take 17 g by mouth daily. Patient taking differently: Take 17 g by mouth daily as needed for mild constipation. 12/02/13  Yes Dwana Melena, PA-C  potassium chloride (KLOR-CON) 10 MEQ tablet TAKE 2 TABLETS(20 MEQ) BY MOUTH DAILY Patient taking differently: Take 20 mEq by mouth in the morning. 07/21/23  Yes Croitoru, Mihai, MD  psyllium (REGULOID) 0.52 g capsule Take 0.52 g by mouth in the morning and at bedtime.   Yes [provider]  traMADol (ULTRAM) 50 MG tablet Take 1 tablet (50 mg total) by mouth 2 (two) times daily as needed for moderate pain (pain score 4-6). 08/25/23  Yes Diane Boyden, MD  traZODone (DESYREL) 50 MG tablet TAKE 1/2 TO 1 TABLET(25 TO 50 MG) BY MOUTH AT BEDTIME Patient taking differently: Take 50 mg by mouth at bedtime. 06/27/23  Yes Diane Boyden, MD  Vibegron (GEMTESA) 75 MG TABS Take 75 mg by mouth daily.   Yes [provider]  vitamin B-12 (CYANOCOBALAMIN) 500 MCG tablet Take 1 tablet (500 mcg total) by mouth 2 (two) times a week. 01/06/22  Yes Diane Boyden, MD  Zinc 50 MG CAPS Take 1 capsule (50 mg total) by mouth daily. 11/04/19  Yes Diane Boyden, MD  alendronate (FOSAMAX) 70 MG  tablet Take 1 tablet (70 mg total) by mouth every 7 (seven) days. Take with a full glass of water on an empty stomach. Patient not taking: Reported on 08/27/2023 06/28/23   Diane Boyden, MD    Inpatient Medications: Scheduled Meds:  apixaban  5 mg  Oral BID   buPROPion  150 mg Oral Daily   cephALEXin  250 mg Oral Q breakfast   cyanocobalamin  500 mcg Oral Once per day on Monday Thursday   fluticasone furoate-vilanterol  1 puff Inhalation Daily   And   umeclidinium bromide  1 puff Inhalation Daily   furosemide  20 mg Intravenous BID   gabapentin  100 mg Oral Daily   gabapentin  300 mg Oral QHS   influenza vaccine adjuvanted  0.5 mL Intramuscular Tomorrow-1000   lisinopril  2.5 mg Oral Daily   methylPREDNISolone (SOLU-MEDROL) injection  40 mg Intravenous Daily   mirabegron ER  25 mg Oral Daily   montelukast  10 mg Oral Daily   potassium chloride  20 mEq Oral Daily   pravastatin  40 mg Oral q1800   psyllium  1 packet Oral Daily   traZODone  50 mg Oral QHS   Continuous Infusions:  PRN Meds: acetaminophen **OR** acetaminophen, ipratropium-albuterol, mouth rinse, polyethylene glycol, polyvinyl alcohol, traMADol  Allergies:    Allergies  Allergen Reactions   Tape Other (See Comments)    "THICK TAPES PULLS OFF MY SKIN"   Codeine Nausea Only   Prednisone Nausea Only   Sulfa Antibiotics Nausea Only    Social History:   Social History   Socioeconomic History   Marital status: Widowed    Spouse name: Not on file   Number of children: 2   Years of education: 31   Highest education level: High school graduate  Occupational History   Occupation: Retired  Tobacco Use   Smoking status: Former    Current packs/day: 0.00    Average packs/day: 1.5 packs/day for 10.0 years (15.0 ttl pk-yrs)    Types: Cigarettes    Start date: 12/27/1964    Quit date: 12/28/1974    Years since quitting: 48.6   Smokeless tobacco: Never   Tobacco comments:    quit smoking in 1976  Vaping Use    Vaping status: Never Used  Substance and Sexual Activity   Alcohol use: Never    Alcohol/week: 2.0 standard drinks of alcohol    Types: 2 Glasses of wine per week   Drug use: No   Sexual activity: Not Currently  Other Topics Concern   Not on file  Social History Narrative   Widow. Husband died 04/21/15 MVA. 1 cat.    2 sons; 1 grandchild. One son died of MI 65   Edu: Completed HS, some college   Married 1966   Retired-[employer], volunteers at [employer]   Activity: pulm rehab   Diet: some water, fruits/vegetables daily      Right handed   Caffeine: 4 glasses of tea and 1 diet coke a day   Social Determinants of Health   Financial Resource Strain: Low Risk  (01/20/2023)   Overall Financial Resource Strain (CARDIA)    Difficulty of Paying Living Expenses: Not very hard  Food Insecurity: No Food Insecurity (08/27/2023)   Hunger Vital Sign    Worried About Running Out of Food in the Last Year: Never true    Ran Out of Food in the Last Year: Never true  Transportation Needs: No Transportation Needs (08/27/2023)   PRAPARE - Administrator, Civil Service (Medical): No    Lack of Transportation (Non-Medical): No  Physical Activity: Unknown (01/20/2023)   Exercise Vital Sign    Days of Exercise per Week: Patient declined    Minutes of Exercise per Session: Not on file  Stress: No Stress Concern Present (01/20/2023)   [employer] of Occupational Health - Occupational Stress Questionnaire    Feeling of Stress : Only a little  Social Connections: Moderately Integrated (01/20/2023)   Social Connection and Isolation Panel [NHANES]    Frequency of Communication with Friends and Family: More than three times a week    Frequency of Social Gatherings with Friends and Family: More than three times a week    Attends Religious Services: More than 4 times per year    Active Member of [employer] or Organizations: Yes    Attends Banker Meetings: More than 4 times per year     Marital Status: Widowed  Intimate Partner Violence: Not At Risk (08/27/2023)   Humiliation, Afraid, Rape, and Kick questionnaire    Fear of Current or Ex-Partner: No    Emotionally Abused: No    Physically Abused: No    Sexually Abused: No    Family History:    Family History  Problem Relation Age of Onset   Heart disease Mother    Diabetes Mother    Hypertension Mother    Stroke Mother    Lung cancer Father    Breast cancer Maternal Aunt 23   Heart attack Maternal Grandfather    CAD Son 30   Heart attack Son 54   Rectal cancer Neg Hx    Stomach cancer Neg Hx    Esophageal cancer Neg Hx    Colon cancer Neg Hx      ROS:  Please see the history of present illness.   All other ROS reviewed and negative.     Physical Exam/Data:   Vitals:   08/27/23 2306 08/28/23 0243 08/28/23 0500 08/28/23 0902  BP: 136/74 (!) 152/83    Pulse: 72 72    Resp:  14    Temp: 97.7 F (36.5 C) (!) 97.4 F (36.3 C)    TempSrc: Oral     SpO2: 91% 90%  93%  Weight:   67.6 kg   Height:        Intake/Output Summary (Last 24 hours) at 08/28/2023 1143 Last data filed at 08/28/2023 0842 Gross per 24 hour  Intake 940 ml  Output --  Net 940 ml      08/28/2023    5:00 AM 08/27/2023    3:05 PM 08/27/2023   10:17 AM  Last 3 Weights  Weight (lbs) 149 lb 0.5 oz 149 lb 0.5 oz 152 lb  Weight (kg) 67.6 kg 67.6 kg 68.947 kg     Body mass index is 25.58 kg/m.  General:  Well nourished, well developed, in no acute distress HEENT: normal Neck: no JVD Vascular: No carotid bruits; Distal pulses 2+ bilaterally Cardiac:  normal S1, S2; RRR; faint systolic murmur noted Lungs:  inspiratory wheezing. Bilateral bases diminished with crackles L>R. Abd: soft, nontender, no hepatomegaly  Ext: no edema Musculoskeletal:  No deformities, BUE and BLE strength normal and equal Skin: warm and dry  Neuro:  CNs 2-12 intact, no focal abnormalities noted Psych:  Normal affect   EKG:  The EKG was personally  reviewed and demonstrates:  No ECG available Telemetry:  Telemetry was personally reviewed and demonstrates:  AV dual paced with some irregularity  Relevant CV Studies:  10/06/22 TTE  IMPRESSIONS     1. Left ventricular ejection fraction, by estimation, is 60 to 65%. The  left ventricle has normal function. The left ventricle has no regional  wall motion abnormalities. There is moderate  concentric left ventricular  hypertrophy. Left ventricular  diastolic parameters are indeterminate.   2. Right ventricular systolic function is mildly reduced. The right  ventricular size is moderately enlarged. There is moderately elevated  pulmonary artery systolic pressure. The estimated right ventricular  systolic pressure is 46.7 mmHg.   3. Left atrial size was severely dilated.   4. The mitral valve has been repaired/replaced. Trivial mitral valve  regurgitation. There is a 25 mm Magna Ease bioprosthetic valve present in  the mitral position. Procedure Date: 12/22/2013. The mean gradient is  at HR 74, EOA 1.3cm2. Likely mild   prosthetic mitral stenosis.   5. The aortic valve has been repaired/replaced. There is a 21 mm  bioprosthetic valve present in the aortic position. Procedure Date:  12/22/2013. Echo findings are consistent with normal structure and function  of the aortic valve prosthesis. Aortic valve  mean gradient measures 14.0 mmHg. Aortic valve Vmax measures 2.56 m/s.  Trivial paravalvular leak   6. Aortic dilatation noted. There is mild dilatation of the ascending  aorta, measuring 43 mm.   7. The inferior vena cava is dilated in size with >50% respiratory  variability, suggesting right atrial pressure of 8 mmHg.   Comparison(s): Compared to prior TTE on 10/2021, there is no significant  change. Prior mean mitral gradient 9.64mmHg, prior mean aortic gradient  12.36mmHg.   FINDINGS   Left Ventricle: Left ventricular ejection fraction, by estimation, is 60  to 65%. The  left ventricle has normal function. The left ventricle has no  regional wall motion abnormalities. The left ventricular internal cavity  size was normal in size. There is   moderate concentric left ventricular hypertrophy. Abnormal (paradoxical)  septal motion consistent with post-operative status. Left ventricular  diastolic parameters are indeterminate.   Right Ventricle: The right ventricular size is moderately enlarged. No  increase in right ventricular wall thickness. Right ventricular systolic  function is mildly reduced. There is moderately elevated pulmonary artery  systolic pressure. The tricuspid  regurgitant velocity is 3.11 m/s, and with an assumed right atrial  pressure of 8 mmHg, the estimated right ventricular systolic pressure is  46.7 mmHg.   Left Atrium: Left atrial size was severely dilated.   Right Atrium: Right atrial size was normal in size.   Pericardium: There is no evidence of pericardial effusion.   Mitral Valve: Mitral valve mean gradient is at HR 74, EOA 1.3cm2 by  continuity. The mitral valve has been repaired/replaced. Trivial mitral  valve regurgitation. There is a 25 mm Magna Ease bioprosthetic valve  present in the mitral position.  Procedure Date: 12/22/2013. Mild to moderate mitral valve stenosis. MV  peak gradient, 22.5 mmHg.   Tricuspid Valve: The tricuspid valve is grossly normal. Tricuspid valve  regurgitation is mild.   Aortic Valve: There is trivial paravalvular leak. EOA 1.6cm2, DI 0.5. The  aortic valve has been repaired/replaced. Aortic valve mean gradient  measures 14.0 mmHg. Aortic valve peak gradient measures 26.1 mmHg. There  is a 21 mm bioprosthetic valve present  in the aortic position. Procedure Date: 12/22/2013. Echo findings are  consistent with normal structure and function of the aortic valve  prosthesis.   Pulmonic Valve: The pulmonic valve was normal in structure. Pulmonic valve  regurgitation is mild.   Aorta:  Aortic dilatation noted. There is mild dilatation of the ascending  aorta, measuring 43 mm.   Venous: The inferior vena cava is dilated in size with greater than 50%  respiratory variability, suggesting  right atrial pressure of 8 mmHg.   IAS/Shunts: The atrial septum is grossly normal.   Additional Comments: A device lead is visualized.    Laboratory Data:  High Sensitivity Troponin:   Recent Labs  Lab 08/27/23 1052 08/27/23 1305  TROPONINIHS 121* 136*     Chemistry Recent Labs  Lab 08/27/23 1052 08/28/23 0405  NA 137 136  K 3.7 4.3  CL 101 99  CO2 28 31  GLUCOSE 101* 125*  BUN 15 20  CREATININE 0.88 0.89  CALCIUM 9.0 9.1  GFRNONAA >60 >60  ANIONGAP 8 6    Recent Labs  Lab 08/27/23 1052 08/28/23 0405  PROT 6.5 6.4*  ALBUMIN 3.1* 3.0*  AST 15 16  ALT 9 10  ALKPHOS 60 62  BILITOT 0.9 0.6   Lipids No results for input(s): "CHOL", "TRIG", "HDL", "LABVLDL", "LDLCALC", "CHOLHDL" in the last 168 hours.  Hematology Recent Labs  Lab 08/23/23 0649 08/27/23 1052 08/28/23 0405  WBC 8.5 5.9 3.5*  RBC 3.59* 3.19* 3.26*  HGB 11.5* 10.5* 10.5*  HCT 37.2 32.5* 33.0*  MCV 103.6* 101.9* 101.2*  MCH 32.0 32.9 32.2  MCHC 30.9 32.3 31.8  RDW 14.4 14.1 14.2  PLT 100* 100* 88*   Thyroid No results for input(s): "TSH", "FREET4" in the last 168 hours.  BNP Recent Labs  Lab 08/27/23 1052  BNP 293.9*    DDimer No results for input(s): "DDIMER" in the last 168 hours.   Radiology/Studies:  DG Chest Port 1 View  Result Date: 08/27/2023 CLINICAL DATA:  Shortness of breath. EXAM: PORTABLE CHEST 1 VIEW COMPARISON:  Two-view chest x-ray 10/09/2022 FINDINGS: The heart is enlarged. Atherosclerotic calcifications are present at the aortic arch. Left greater than right pleural effusions have increased. Mild interstitial edema is present. Bibasilar airspace opacities are noted, left greater than right. IMPRESSION: 1. Cardiomegaly and mild interstitial edema compatible with  congestive heart failure. 2. Left greater than right pleural effusions. 3. Bibasilar airspace disease likely reflects atelectasis. Electronically Signed   By: Marin Roberts [name]   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 CXR with cardiomegaly and left greater than right pleural effusions.  Mild improvement in breathing noted with diuresis. Her presentation is suspicious for mixed etiology with underlying lung disease and hx valve dysfunction. Continue IV diuresis.  Follow echocardiogram. Elevated troponin 121->136 may represent demand ischemia. However, if new cardiomyopathy, would need ischemic evaluation.  Further GDMT per echo findings.  Prosthetic MV stenosis  Transient, related to thrombotic leaflet restriction.  Mean mitral valve gradients have been stable at around 7-10  mmHg, most recently evaluated in January 2023.  Was switched from warfarin back to Eliquis due to problems with maintaining INR in therapeutic range and easy bleeding problems when INR was high.   Update echo  S/p AVR  Normal function of the aortic valve biological prosthesis on recent echo.  Stable gradients.  Update echocardiogram. Symptoms concerning for possible valve dysfunction.  Atrial fibrillation Hx CHB s/p SJ PPM  Patient in atrial fibrillation essentially 100% of 2021. More recent device interrogations with extremely low burden of atrial fibrillation.  Telemetry concerning for afib recurrence. Device interrogation requested to review last 1 month of rates. No ECG yet this admission. Ordered. Continue Eliquis  Hyperlipidemia  Continue statin therapy.   Hypertension  BP stable this admission on Lasix and Lisinopril 2.5mg . Would favor transition to ARB to facilitate potential  switch to [facility] pending echo results.   Per primary team: COPD  Peripheral neuropathy Thrombocytopenia    Risk Assessment/Risk Scores:        New York Heart Association (NYHA) Functional Class NYHA Class III  CHA2DS2-VASc Score = 6   This indicates a 9.7% annual risk of stroke. The patient's score is based upon: CHF History: 1 HTN History: 1 Diabetes History: 0 Stroke History: 0 Vascular Disease History: 1 Age Score: 2 Gender Score: 1         For questions or updates, please contact [facility] Please consult [email or web address] for contact info under    Signed, Perlie Gold, PA-C  08/28/2023 11:43 AM

## 2023-08-28 NOTE — Progress Notes (Signed)
Per discussion with team made NPO for possible TEE DCCV tomorrow if schedule can accommodate. Pt missed Eliquis doses 11/12-11/14 for BMB (held on her own accord per her report).

## 2023-08-28 NOTE — Care Management Obs Status (Signed)
MEDICARE OBSERVATION STATUS NOTIFICATION   Patient Details  Name: [name] MRN: [id number] Date of Birth: 03/13/1946   Medicare Observation Status Notification Given:  Yes    Larrie Kass, LCSW 08/28/2023, 11:47 AM

## 2023-08-28 NOTE — H&P (View-Only) (Signed)
Cardiology Consultation   Patient ID: [name] MRN: [id number]; DOB: [birth year]  Admit date: 08/27/2023 Date of Consult: 08/28/2023  PCP:  [name], MD   [facility] Providers Cardiologist:  Thurmon Fair, MD        Patient Profile:   [name] is a 77 y.o. 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 Robb Matar.  History of Present Illness:   [name] presented to the ED for evaluation of progressive dyspnea, particularly on exertion with wheezing as well as O2 desaturation (also on exertion). Patient reports these symptoms have been ongoing for the past 2 weeks but she didn't start checking O2 level until last week. She also reports significant fatigue at baseline in the last couple of weeks. Denies recent LE edema, orthopnea/PND, chest pain. Reports some palpitations with exertion. In the ED, CXR with cardiomegaly and mild interstitial edema consistent with CHF as well as left>right pleural effusions. Troponin 121->136. Resp virus panel negative. BNP 293.9.  On exam today, patient reports mild improvement in dyspnea on exertion with diuresis. She is generally concerned about low energy levels and fatigue.   Past Medical History:  Diagnosis Date   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 12/31/2016   Acute cystitis without hematuria  12/22/2015   Acute right-sided low back pain without sciatica 11/26/2019   Saw Elsner 12/2019 - planned L2/3 translaminar epidural steroid injection. Known DISH with thoracic spine fusion and moderate kyphosis, h/o L spine fusion L2-5   Adjustment disorder with depressed mood 10/31/2014   64 yo son died MI 10/18/2014 Husband died car accident 04/19/15   Anemia    Anticoagulant long-term use 06/14/2019   She takes warfarin for afib/flutter with CHADS2VASc score of 4 (age, sex, CHF, HTN), s/p 2 bioprosthetic valve replacements Coumadin changed to eliquis 2022 per cardiology   Aortic atherosclerosis 03/03/2022   Noted on 01/2022 chest CT   Arthritis    Arthropathy of spinal facet joint 03/08/2018   Ascending aortic aneurysm 03/02/2021   4.1cm on CT 01/2021 rec yearly monitoring   Bilateral hip pain 09/08/2016   Cancer (HCC)    conon cancer 2012   CAP (community acquired pneumonia) 02/08/2016   Carotid stenosis 12/08/2020   Carotid US - 40-59% BICA stenosis (2015) Carotid US 12/2020 - 1-39% BICA stenosis, incidental 3.4cm structure behind L common carotid artery rec further imaging    Cataracts, bilateral    immature   Chest pain 08/25/2014   myoview low risk 02/2018 (Camitz)   Chronic cholecystitis with calculus 01/21/2015   S/p cholecystectomy    Chronic diastolic heart failure 09/19/2014   Chronic insomnia    Chronic lower back pain 03/05/2008   Returned to Dr Danielle Dess - translaminar ESI at L3/4. Myelogram showed significant left sided foraminal stenosis at L2/3 and L3/4 in setting of solid arthrodesis, planned DEXA and if stable osteopenia, considering anterolateral decompression with spacer.    CKD (chronic kidney disease) stage 2, GFR  60-89 ml/min 07/03/2015   Constipation    takes Miralax daily as needed   COPD (chronic obstructive pulmonary disease)    Albuterol inhaler daily as needed;Duoneb daily as needed;Spiriva daily   COVID-19 virus infection 11/11/2022   DDD (degenerative disc  disease)    cervical - kyphosis with mod DD changes C5/6 and C6/7; lumbar - early DD at L2/3 (Elsner)   Depression    DISH (diffuse idiopathic skeletal hyperostosis) 05/14/2019   By MRI noted fusion of lower thoracic vertebrae - likely contributing to unsteadiness (Elsner)   Dyspnea on exertion 06/05/2015   myoview low risk 02/2018   Edema of both lower legs due to peripheral venous insufficiency    Essential hypertension, benign 03/24/2009   hx of not on nmeds currenlty and has been a while since on meds per pt   Heart murmur    Hematuria 06/17/2021   History of kidney stones    History of radiation therapy 05/30/11 to 07/07/11   rectum   History of rectal cancer 05/26/2011   Rectal cancer 2012, midrectum ypT3ypN0, s/p neoadj chemo&XRT, lap LAR with diverting loop ileostomy 08/2011, ileostomy takedown 03/2012 Discharged from oncology clinic 2017 Truett Perna)   History of shingles    History of vertebral fracture 11/26/2019   Old L1 vertebral compression fracture incidentally noted on imaging 2020    Hyperlipemia    takes Lovastatin daily   Lacunar infarction    Left caudate   Left hip pain 07/29/2020   Legally blind in left eye    Lymphangioma 01/07/2021   Noted incidentally on carotid US 12/2020 CT neck - Likely benign lymphangioma 01/2021   Macrocytosis 03/27/2022   Folate and b12 checked 2023   Malaise and fatigue 01/22/2018   Meralgia paresthetica 12/13/2007   Mixed stress and urge urinary incontinence 03/16/2015   Failed oxybutynin IR/ER. Vesicare initially helped but then no longer helpful.  myrbetriq was not effective.  Saw urology (MacDiarmid) - UDS largely overactive bladder with mild-mod stress incontinence. Improved on abx course - maintained on [place].    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   Pulmonary infiltrates 04/23/2019   06/13/2018-CT super D chest without contrast- generally stable chronic lung disease with pleural parenchymal scarring and scattered nodularity most of these nodules have been tree-in-bud in appearance likely postinfectious or inflammatory the dominant right lower lobe nodule seen on most recent study has resolved no new or enlarging nodules  04/22/2019-CT chest without contrast- waxing and waning per   Pulmonary nodule 02/23/2012   RLL nodule-70mm stable 2006, April 2009, and June 2009 Small pulm nodules Jan 2012 - >  Oct 2012 without change   Rheumatic heart disease mitral stenosis    mod MS by echo 02/2014   Right leg swelling 06/11/2020   R venous US 06/2020 WNL: - No evidence of deep vein thrombosis in the lower extremity. No indirect evidence of obstruction proximal to the inguinal ligament. - No cystic structure found in the popliteal fossa.   S/P aortic and mitral valve bioprostheses 12/08/2013   AVR 21 mm, MVR 25 mm-MagnaEase pericardia   S/P AVR (aortic valve replacement) 2015   bioprosthetic (Bartle)   S/P MVR (mitral valve replacement) 2015   bioprosthetic (Bartle)   Sepsis secondary to UTI (HCC)  08/22/2017   Severe aortic valve stenosis 10/01/2013   mild-mod by echo 02/2014   Skin lesion 01/03/2022   Small bowel obstruction, partial 08/2013   reolved without NGT placement.    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   Dr Amy Swaziland   Syncope 05/28/2018   Thrombocytopenia 11/04/2019   Typical atrial flutter 05/15/2019   Vitamin B12 deficiency 02/28/2015   Start B12 shots 02/2015    Vitamin D deficiency 10/23/2016   Wounds, multiple    bilateral legs    Past Surgical History:  Procedure Laterality Date   ANTERIOR LAT LUMBAR FUSION Left 07/17/2018   Procedure: Lumbar Two-Three Lumbar Three-Four Anterolateral decompression/interbody fusion with lateral plate fixation/Infuse;  Surgeon: Barnett Abu, MD;  Location: [facility] OR;  Service: Neurosurgery;  Laterality: Left;  Lumbar Two-Three Lumbar Three-Four Anterolateral decompression/interbody fusion with lateral plate fixation/Infuse   AORTIC VALVE REPLACEMENT N/A 12/12/2013   Procedure: AORTIC VALVE REPLACEMENT (AVR);  Surgeon: Alleen Borne, MD; Service: Open Heart Surgery   BACK SURGERY  2006, 2007   2006 SPACER, 2007 decompression and fusion L4/5   BOWEL RESECTION  04/02/2012   Procedure: SMALL BOWEL RESECTION;  Surgeon: [name], MD;  Location: [facility] ORS;  Service: General;  Laterality: N/A;   BRONCHIAL WASHINGS  03/24/2021   Procedure: BRONCHIAL WASHINGS;  Surgeon: [name], MD;  Location: [facility] END[facility];  Service: Endoscopy;;   CARPAL TUNNEL RELEASE Bilateral    CATARACT EXTRACTION W/ INTRAOCULAR LENS IMPLANT Left 10/18/2016   CATARACT EXTRACTION W/ INTRAOCULAR LENS IMPLANT Right 12/13/2016   Dr. Dagoberto Ligas   CHOLECYSTECTOMY N/A 01/21/2015   chronic cholecystitis, [name], MD   COLON RESECTION  08/25/2011   Procedure: COLON RESECTION LAPAROSCOPIC;  Surgeon: [name], MD;  Location: [facility];  Service: General;  Laterality: N/A;  Laparoscopic Assisted Low Anterior Resection Diverting Ostomy  and onQ pain pump   COLONOSCOPY  03/2013   1 polyp, rpt 3 yrs Christella Hartigan)   COLONOSCOPY  05/2021   diverticulosis and patent colon anastomosis with some edema/narrowing, rpt 5 yrs Christella Hartigan)   COLONOSCOPY WITH PROPOFOL N/A 06/09/2016   patent colo-colonic anastomosis, rpt 5 yrs Christella Hartigan)   CYSTOSCOPY/URETEROSCOPY/HOLMIUM LASER/STENT PLACEMENT Right 07/03/2023   Procedure: CYSTOSCOPY RIGHT RETROGRADE PYEPLGRAM RIGHT URETEROSCOPY/HOLMIUM LASER/STENT PLACEMENT;  Surgeon: Crista Elliot, MD;  Location: [facility];  Service: Urology;  Laterality: Right;  1 HR FOR CASE   CYSTOSCOPY/URETEROSCOPY/HOLMIUM LASER/STENT PLACEMENT Right 07/17/2023   Procedure: CYSTOSCOPY/RIGHT URETEROSCOPY/STENT EXCHANGE;  Surgeon: Crista Elliot, MD;  Location: [facility];  Service: Urology;  Laterality: Right;  60 MINS FOR CASE   EP IMPLANTABLE DEVICE N/A 06/16/2016   Procedure: Pacemaker Implant;  Surgeon: Will Jorja Loa, MD;  Location: [facility];  Service: Cardiovascular;  Laterality: N/A;   FOOT SURGERY  left foot   hammer toe  ILEOSTOMY  08/25/2011   ILEOSTOMY CLOSURE  04/02/2012   Procedure: ILEOSTOMY TAKEDOWN;  Surgeon: [name], MD;  Location: [facility];  Service: General;  Laterality: N/A;   INTRAOPERATIVE TRANSESOPHAGEAL ECHOCARDIOGRAM N/A 12/12/2013   Procedure: INTRAOPERATIVE TRANSESOPHAGEAL ECHOCARDIOGRAM;  Surgeon: Alleen Borne, MD;  Location: [facility];  Service: Open Heart Surgery;  Laterality: N/A;   LEFT AND RIGHT HEART CATHETERIZATION WITH CORONARY ANGIOGRAM N/A 11/27/2013   Procedure: LEFT AND RIGHT HEART CATHETERIZATION WITH CORONARY ANGIOGRAM;  Surgeon: Thurmon Fair, MD;  Location: [facility] CATH [facility];  Service: Cardiovascular;  Laterality: N/A;   MITRAL VALVE REPLACEMENT N/A 12/12/2013   Procedure: MITRAL VALVE (MV) REPLACEMENT OR REPAIR;  Surgeon: Alleen Borne, MD; Service: Open Heart Surgery   TEE WITHOUT CARDIOVERSION N/A 11/29/2013   Procedure: TRANSESOPHAGEAL ECHOCARDIOGRAM (TEE);  Surgeon:  Quintella Reichert, MD;  Location: [facility] ENDOSCOPY;  Service: Cardiovascular;  Laterality: N/A;   TEE WITHOUT CARDIOVERSION N/A 06/04/2019   Procedure: TRANSESOPHAGEAL ECHOCARDIOGRAM (TEE);  Surgeon: Thurmon Fair, MD;  Location: [facility] ENDOSCOPY;  Service: Cardiovascular;  Laterality: N/A;   TONSILLECTOMY     VIDEO BRONCHOSCOPY N/A 03/24/2021   Procedure: VIDEO BRONCHOSCOPY WITHOUT FLUORO;  Surgeon: [name], MD;  Location: [facility];  Service: Endoscopy;  Laterality: N/A;  SMALL SCOPE,PREMEDICATE WITH NEBULIZED LIDOCAINE     Home Medications:  Prior to Admission medications   Medication Sig Start Date End Date Taking? Authorizing Provider  acetaminophen (TYLENOL) 500 MG tablet Take 2 tablets (1,000 mg total) by mouth daily. Patient taking differently: Take 1,000 mg by mouth in the morning. 05/02/19  Yes [name], MD  acidophilus [facility]) CAPS capsule Take 1 capsule by mouth daily.   Yes [provider]  albuterol (VENTOLIN HFA) 108 (90 Base) MCG/ACT inhaler Inhale 2 puffs into the lungs every 6 (six) hours as needed for wheezing. 06/05/23  Yes [name], MD  amoxicillin (AMOXIL) 500 MG tablet Take 2,000 mg by mouth See admin instructions. Take 2,000 mg by mouth one hour prior to dental treatments   Yes [provider]  ascorbic acid (VITAMIN C) 500 MG tablet Take 1 tablet (500 mg total) by mouth daily. 10/05/22  Yes [name], MD  Biotin 1000 MCG tablet Take 1,000 mcg by mouth daily.   Yes [provider]  buPROPion (WELLBUTRIN SR) 150 MG 12 hr tablet TAKE 1 TABLET(150 MG) BY MOUTH EVERY MORNING Patient taking differently: Take 150 mg by mouth in the morning. 07/21/23  Yes [name], MD  calcium carbonate (OSCAL) 1500 (600 Ca) MG TABS tablet Take 600 mg of elemental calcium by mouth daily.   Yes [provider]  carboxymethylcellulose (REFRESH PLUS) 0.5 % SOLN Place 1 drop into both eyes 3 (three) times daily as needed  (dry eyes).   Yes [provider]  cephALEXin (KEFLEX) 250 MG capsule Take 250 mg by mouth daily with breakfast. 07/13/23  Yes [provider]  cholecalciferol (VITAMIN D3) 25 MCG (1000 UNIT) tablet Take 1,000 Units by mouth daily.   Yes [provider]  docusate sodium (COLACE) 100 MG capsule Take 200 mg by mouth daily.   Yes [provider]  ELIQUIS 5 MG TABS tablet TAKE 1 TABLET(5 MG) BY MOUTH TWICE DAILY Patient taking differently: Take 5 mg by mouth in the morning and at bedtime. 08/24/23  Yes Croitoru, Mihai, MD  Fluticasone-Umeclidin-Vilant (TRELEGY ELLIPTA) 200-62.5-25 MCG/ACT AEPB Inhale 1 puff into the lungs daily. 06/05/23  Yes [name], MD  furosemide (LASIX) 40 MG tablet  TAKE 1 TABLET BY MOUTH EVERY DAY. MAY TAKE AN EXTRA TABLET AS NEEDED FOR SWELLING Patient taking differently: Take 40 mg by mouth in the morning. 08/24/23  Yes Croitoru, Mihai, MD  gabapentin (NEURONTIN) 100 MG capsule Take 1 capsule (100 mg total) by mouth 2 (two) times daily as needed (back pain - for daytime use). Patient taking differently: Take 100 mg by mouth daily. 04/24/23  Yes [name], MD  gabapentin (NEURONTIN) 300 MG capsule Take 300 mg by mouth at bedtime.   Yes [provider]  hydrocortisone (ANUSOL-HC) 2.5 % rectal cream Place 1 Application rectally 2 (two) times daily. Patient taking differently: Place 1 Application rectally 2 (two) times daily as needed for hemorrhoids (or pain). 04/24/23  Yes [name], MD  ipratropium-albuterol (DUONEB) 0.5-2.5 (3) MG/3ML SOLN Take 3 mLs by nebulization every 6 (six) hours as needed. Patient taking differently: Take 3 mLs by nebulization every 6 (six) hours as needed (for wheezing or shortness of breath). 11/14/14  Yes Pecola Lawless, MD  lovastatin (MEVACOR) 40 MG tablet TAKE 1 TABLET(40 MG) BY MOUTH AT BEDTIME Patient taking differently: Take 40 mg by mouth daily. 07/21/23  Yes [name],  MD  montelukast (SINGULAIR) 10 MG tablet TAKE 1 TABLET(10 MG) BY MOUTH AT BEDTIME Patient taking differently: Take 10 mg by mouth daily. 02/13/23  Yes [name], MD  Multiple Vitamins-Minerals (PRESERVISION AREDS 2 PO) Take 2 capsules by mouth in the morning.   Yes [provider]  ondansetron (ZOFRAN-ODT) 4 MG disintegrating tablet Take 1 tablet (4 mg total) by mouth every 8 (eight) hours as needed for nausea or vomiting. Patient taking differently: Take 4 mg by mouth every 8 (eight) hours as needed for nausea or vomiting (DISSOLVE ORALLY). 07/03/23  Yes Crista Elliot, MD  Phenazopyrid-Cranbry-C-Probiot (AZO URINARY TRACT SUPPORT PO) Take 1 capsule by mouth daily.   Yes [provider]  polyethylene glycol (MIRALAX / GLYCOLAX) packet Take 17 g by mouth daily. Patient taking differently: Take 17 g by mouth daily as needed for mild constipation. 12/02/13  Yes Dwana Melena, PA-C  potassium chloride (KLOR-CON) 10 MEQ tablet TAKE 2 TABLETS(20 MEQ) BY MOUTH DAILY Patient taking differently: Take 20 mEq by mouth in the morning. 07/21/23  Yes Croitoru, Mihai, MD  psyllium (REGULOID) 0.52 g capsule Take 0.52 g by mouth in the morning and at bedtime.   Yes [provider]  traMADol (ULTRAM) 50 MG tablet Take 1 tablet (50 mg total) by mouth 2 (two) times daily as needed for moderate pain (pain score 4-6). 08/25/23  Yes [name], MD  traZODone (DESYREL) 50 MG tablet TAKE 1/2 TO 1 TABLET(25 TO 50 MG) BY MOUTH AT BEDTIME Patient taking differently: Take 50 mg by mouth at bedtime. 06/27/23  Yes [name], MD  Vibegron (GEMTESA) 75 MG TABS Take 75 mg by mouth daily.   Yes [provider]  vitamin B-12 (CYANOCOBALAMIN) 500 MCG tablet Take 1 tablet (500 mcg total) by mouth 2 (two) times a week. 01/06/22  Yes [name], MD  Zinc 50 MG CAPS Take 1 capsule (50 mg total) by mouth daily. 11/04/19  Yes [name], MD  alendronate (FOSAMAX) 70 MG  tablet Take 1 tablet (70 mg total) by mouth every 7 (seven) days. Take with a full glass of water on an empty stomach. Patient not taking: Reported on 08/27/2023 06/28/23   [name], MD    Inpatient Medications: Scheduled Meds:  apixaban  5 mg  Oral BID   buPROPion  150 mg Oral Daily   cephALEXin  250 mg Oral Q breakfast   cyanocobalamin  500 mcg Oral Once per day on Monday Thursday   fluticasone furoate-vilanterol  1 puff Inhalation Daily   And   umeclidinium bromide  1 puff Inhalation Daily   furosemide  20 mg Intravenous BID   gabapentin  100 mg Oral Daily   gabapentin  300 mg Oral QHS   influenza vaccine adjuvanted  0.5 mL Intramuscular Tomorrow-1000   lisinopril  2.5 mg Oral Daily   methylPREDNISolone (SOLU-MEDROL) injection  40 mg Intravenous Daily   mirabegron ER  25 mg Oral Daily   montelukast  10 mg Oral Daily   potassium chloride  20 mEq Oral Daily   pravastatin  40 mg Oral q1800   psyllium  1 packet Oral Daily   traZODone  50 mg Oral QHS   Continuous Infusions:  PRN Meds: acetaminophen **OR** acetaminophen, ipratropium-albuterol, mouth rinse, polyethylene glycol, polyvinyl alcohol, traMADol  Allergies:    Allergies  Allergen Reactions   Tape Other (See Comments)    "THICK TAPES PULLS OFF MY SKIN"   Codeine Nausea Only   Prednisone Nausea Only   Sulfa Antibiotics Nausea Only    Social History:   Social History   Socioeconomic History   Marital status: Widowed    Spouse name: Not on file   Number of children: 2   Years of education: 31   Highest education level: High school graduate  Occupational History   Occupation: Retired  Tobacco Use   Smoking status: Former    Current packs/day: 0.00    Average packs/day: 1.5 packs/day for 10.0 years (15.0 ttl pk-yrs)    Types: Cigarettes    Start date: 12/27/1964    Quit date: 12/28/1974    Years since quitting: 48.6   Smokeless tobacco: Never   Tobacco comments:    quit smoking in 1976  Vaping Use    Vaping status: Never Used  Substance and Sexual Activity   Alcohol use: Never    Alcohol/week: 2.0 standard drinks of alcohol    Types: 2 Glasses of wine per week   Drug use: No   Sexual activity: Not Currently  Other Topics Concern   Not on file  Social History Narrative   Widow. Husband died 04/21/15 MVA. 1 cat.    2 sons; 1 grandchild. One son died of MI 65   Edu: Completed HS, some college   Married 1966   Retired-[employer], volunteers at [employer]   Activity: pulm rehab   Diet: some water, fruits/vegetables daily      Right handed   Caffeine: 4 glasses of tea and 1 diet coke a day   Social Determinants of Health   Financial Resource Strain: Low Risk  (01/20/2023)   Overall Financial Resource Strain (CARDIA)    Difficulty of Paying Living Expenses: Not very hard  Food Insecurity: No Food Insecurity (08/27/2023)   Hunger Vital Sign    Worried About Running Out of Food in the Last Year: Never true    Ran Out of Food in the Last Year: Never true  Transportation Needs: No Transportation Needs (08/27/2023)   PRAPARE - Administrator, Civil Service (Medical): No    Lack of Transportation (Non-Medical): No  Physical Activity: Unknown (01/20/2023)   Exercise Vital Sign    Days of Exercise per Week: Patient declined    Minutes of Exercise per Session: Not on file  Stress: No Stress Concern Present (01/20/2023)   [employer] of Occupational Health - Occupational Stress Questionnaire    Feeling of Stress : Only a little  Social Connections: Moderately Integrated (01/20/2023)   Social Connection and Isolation Panel [NHANES]    Frequency of Communication with Friends and Family: More than three times a week    Frequency of Social Gatherings with Friends and Family: More than three times a week    Attends Religious Services: More than 4 times per year    Active Member of [employer] or Organizations: Yes    Attends Banker Meetings: More than 4 times per year     Marital Status: Widowed  Intimate Partner Violence: Not At Risk (08/27/2023)   Humiliation, Afraid, Rape, and Kick questionnaire    Fear of Current or Ex-Partner: No    Emotionally Abused: No    Physically Abused: No    Sexually Abused: No    Family History:    Family History  Problem Relation Age of Onset   Heart disease Mother    Diabetes Mother    Hypertension Mother    Stroke Mother    Lung cancer Father    Breast cancer Maternal Aunt 23   Heart attack Maternal Grandfather    CAD Son 30   Heart attack Son 54   Rectal cancer Neg Hx    Stomach cancer Neg Hx    Esophageal cancer Neg Hx    Colon cancer Neg Hx      ROS:  Please see the history of present illness.   All other ROS reviewed and negative.     Physical Exam/Data:   Vitals:   08/27/23 2306 08/28/23 0243 08/28/23 0500 08/28/23 0902  BP: 136/74 (!) 152/83    Pulse: 72 72    Resp:  14    Temp: 97.7 F (36.5 C) (!) 97.4 F (36.3 C)    TempSrc: Oral     SpO2: 91% 90%  93%  Weight:   67.6 kg   Height:        Intake/Output Summary (Last 24 hours) at 08/28/2023 1143 Last data filed at 08/28/2023 0842 Gross per 24 hour  Intake 940 ml  Output --  Net 940 ml      08/28/2023    5:00 AM 08/27/2023    3:05 PM 08/27/2023   10:17 AM  Last 3 Weights  Weight (lbs) 149 lb 0.5 oz 149 lb 0.5 oz 152 lb  Weight (kg) 67.6 kg 67.6 kg 68.947 kg     Body mass index is 25.58 kg/m.  General:  Well nourished, well developed, in no acute distress HEENT: normal Neck: no JVD Vascular: No carotid bruits; Distal pulses 2+ bilaterally Cardiac:  normal S1, S2; RRR; faint systolic murmur noted Lungs:  inspiratory wheezing. Bilateral bases diminished with crackles L>R. Abd: soft, nontender, no hepatomegaly  Ext: no edema Musculoskeletal:  No deformities, BUE and BLE strength normal and equal Skin: warm and dry  Neuro:  CNs 2-12 intact, no focal abnormalities noted Psych:  Normal affect   EKG:  The EKG was personally  reviewed and demonstrates:  No ECG available Telemetry:  Telemetry was personally reviewed and demonstrates:  AV dual paced with some irregularity  Relevant CV Studies:  10/06/22 TTE  IMPRESSIONS     1. Left ventricular ejection fraction, by estimation, is 60 to 65%. The  left ventricle has normal function. The left ventricle has no regional  wall motion abnormalities. There is moderate  concentric left ventricular  hypertrophy. Left ventricular  diastolic parameters are indeterminate.   2. Right ventricular systolic function is mildly reduced. The right  ventricular size is moderately enlarged. There is moderately elevated  pulmonary artery systolic pressure. The estimated right ventricular  systolic pressure is 46.7 mmHg.   3. Left atrial size was severely dilated.   4. The mitral valve has been repaired/replaced. Trivial mitral valve  regurgitation. There is a 25 mm Magna Ease bioprosthetic valve present in  the mitral position. Procedure Date: 12/22/2013. The mean gradient is  at HR 74, EOA 1.3cm2. Likely mild   prosthetic mitral stenosis.   5. The aortic valve has been repaired/replaced. There is a 21 mm  bioprosthetic valve present in the aortic position. Procedure Date:  12/22/2013. Echo findings are consistent with normal structure and function  of the aortic valve prosthesis. Aortic valve  mean gradient measures 14.0 mmHg. Aortic valve Vmax measures 2.56 m/s.  Trivial paravalvular leak   6. Aortic dilatation noted. There is mild dilatation of the ascending  aorta, measuring 43 mm.   7. The inferior vena cava is dilated in size with >50% respiratory  variability, suggesting right atrial pressure of 8 mmHg.   Comparison(s): Compared to prior TTE on 10/2021, there is no significant  change. Prior mean mitral gradient 9.64mmHg, prior mean aortic gradient  12.36mmHg.   FINDINGS   Left Ventricle: Left ventricular ejection fraction, by estimation, is 60  to 65%. The  left ventricle has normal function. The left ventricle has no  regional wall motion abnormalities. The left ventricular internal cavity  size was normal in size. There is   moderate concentric left ventricular hypertrophy. Abnormal (paradoxical)  septal motion consistent with post-operative status. Left ventricular  diastolic parameters are indeterminate.   Right Ventricle: The right ventricular size is moderately enlarged. No  increase in right ventricular wall thickness. Right ventricular systolic  function is mildly reduced. There is moderately elevated pulmonary artery  systolic pressure. The tricuspid  regurgitant velocity is 3.11 m/s, and with an assumed right atrial  pressure of 8 mmHg, the estimated right ventricular systolic pressure is  46.7 mmHg.   Left Atrium: Left atrial size was severely dilated.   Right Atrium: Right atrial size was normal in size.   Pericardium: There is no evidence of pericardial effusion.   Mitral Valve: Mitral valve mean gradient is at HR 74, EOA 1.3cm2 by  continuity. The mitral valve has been repaired/replaced. Trivial mitral  valve regurgitation. There is a 25 mm Magna Ease bioprosthetic valve  present in the mitral position.  Procedure Date: 12/22/2013. Mild to moderate mitral valve stenosis. MV  peak gradient, 22.5 mmHg.   Tricuspid Valve: The tricuspid valve is grossly normal. Tricuspid valve  regurgitation is mild.   Aortic Valve: There is trivial paravalvular leak. EOA 1.6cm2, DI 0.5. The  aortic valve has been repaired/replaced. Aortic valve mean gradient  measures 14.0 mmHg. Aortic valve peak gradient measures 26.1 mmHg. There  is a 21 mm bioprosthetic valve present  in the aortic position. Procedure Date: 12/22/2013. Echo findings are  consistent with normal structure and function of the aortic valve  prosthesis.   Pulmonic Valve: The pulmonic valve was normal in structure. Pulmonic valve  regurgitation is mild.   Aorta:  Aortic dilatation noted. There is mild dilatation of the ascending  aorta, measuring 43 mm.   Venous: The inferior vena cava is dilated in size with greater than 50%  respiratory variability, suggesting  right atrial pressure of 8 mmHg.   IAS/Shunts: The atrial septum is grossly normal.   Additional Comments: A device lead is visualized.    Laboratory Data:  High Sensitivity Troponin:   Recent Labs  Lab 08/27/23 1052 08/27/23 1305  TROPONINIHS 121* 136*     Chemistry Recent Labs  Lab 08/27/23 1052 08/28/23 0405  NA 137 136  K 3.7 4.3  CL 101 99  CO2 28 31  GLUCOSE 101* 125*  BUN 15 20  CREATININE 0.88 0.89  CALCIUM 9.0 9.1  GFRNONAA >60 >60  ANIONGAP 8 6    Recent Labs  Lab 08/27/23 1052 08/28/23 0405  PROT 6.5 6.4*  ALBUMIN 3.1* 3.0*  AST 15 16  ALT 9 10  ALKPHOS 60 62  BILITOT 0.9 0.6   Lipids No results for input(s): "CHOL", "TRIG", "HDL", "LABVLDL", "LDLCALC", "CHOLHDL" in the last 168 hours.  Hematology Recent Labs  Lab 08/23/23 0649 08/27/23 1052 08/28/23 0405  WBC 8.5 5.9 3.5*  RBC 3.59* 3.19* 3.26*  HGB 11.5* 10.5* 10.5*  HCT 37.2 32.5* 33.0*  MCV 103.6* 101.9* 101.2*  MCH 32.0 32.9 32.2  MCHC 30.9 32.3 31.8  RDW 14.4 14.1 14.2  PLT 100* 100* 88*   Thyroid No results for input(s): "TSH", "FREET4" in the last 168 hours.  BNP Recent Labs  Lab 08/27/23 1052  BNP 293.9*    DDimer No results for input(s): "DDIMER" in the last 168 hours.   Radiology/Studies:  DG Chest Port 1 View  Result Date: 08/27/2023 CLINICAL DATA:  Shortness of breath. EXAM: PORTABLE CHEST 1 VIEW COMPARISON:  Two-view chest x-ray 10/09/2022 FINDINGS: The heart is enlarged. Atherosclerotic calcifications are present at the aortic arch. Left greater than right pleural effusions have increased. Mild interstitial edema is present. Bibasilar airspace opacities are noted, left greater than right. IMPRESSION: 1. Cardiomegaly and mild interstitial edema compatible with  congestive heart failure. 2. Left greater than right pleural effusions. 3. Bibasilar airspace disease likely reflects atelectasis. Electronically Signed   By: Marin Roberts [name]   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 CXR with cardiomegaly and left greater than right pleural effusions.  Mild improvement in breathing noted with diuresis. Her presentation is suspicious for mixed etiology with underlying lung disease and hx valve dysfunction. Continue IV diuresis.  Follow echocardiogram. Elevated troponin 121->136 may represent demand ischemia. However, if new cardiomyopathy, would need ischemic evaluation.  Further GDMT per echo findings.  Prosthetic MV stenosis  Transient, related to thrombotic leaflet restriction.  Mean mitral valve gradients have been stable at around 7-10  mmHg, most recently evaluated in January 2023.  Was switched from warfarin back to Eliquis due to problems with maintaining INR in therapeutic range and easy bleeding problems when INR was high.   Update echo  S/p AVR  Normal function of the aortic valve biological prosthesis on recent echo.  Stable gradients.  Update echocardiogram. Symptoms concerning for possible valve dysfunction.  Atrial fibrillation Hx CHB s/p SJ PPM  Patient in atrial fibrillation essentially 100% of 2021. More recent device interrogations with extremely low burden of atrial fibrillation.  Telemetry concerning for afib recurrence. Device interrogation requested to review last 1 month of rates. No ECG yet this admission. Ordered. Continue Eliquis  Hyperlipidemia  Continue statin therapy.   Hypertension  BP stable this admission on Lasix and Lisinopril 2.5mg . Would favor transition to ARB to facilitate potential  switch to [facility] pending echo results.   Per primary team: COPD  Peripheral neuropathy Thrombocytopenia    Risk Assessment/Risk Scores:        New York Heart Association (NYHA) Functional Class NYHA Class III  CHA2DS2-VASc Score = 6   This indicates a 9.7% annual risk of stroke. The patient's score is based upon: CHF History: 1 HTN History: 1 Diabetes History: 0 Stroke History: 0 Vascular Disease History: 1 Age Score: 2 Gender Score: 1         For questions or updates, please contact [facility] Please consult [email or web address] for contact info under    Signed, Perlie Gold, PA-C  08/28/2023 11:43 AM

## 2023-08-28 NOTE — Progress Notes (Signed)
Mobility Specialist - Progress Note  Pre-mobility: 74 bpm HR, 93% SpO2 During mobility: 94 bpm HR, 88% SpO2 Post-mobility: 76 bpm HR, 92% SPO2   08/28/23 1442  Mobility  Activity Ambulated with assistance to bathroom;Ambulated with assistance in hallway  Level of Assistance Independent  Assistive Device Four point cane  Distance Ambulated (ft) 100 ft  Range of Motion/Exercises Active  Activity Response Tolerated fair  Mobility Referral Yes  $Mobility charge 1 Mobility  Mobility Specialist Start Time (ACUTE ONLY) 1430  Mobility Specialist Stop Time (ACUTE ONLY) 1442  Mobility Specialist Time Calculation (min) (ACUTE ONLY) 12 min   Pt was found in bed and agreeable to ambulate after bathroom use. Pt c/o SOB with session had x1 standing rest break during session. Able to increase SPO2 >90% within 30 seconds. Pt returned to bed with all needs met. Call bell in reach.  Billey Chang Mobility Specialist

## 2023-08-28 NOTE — TOC CM/SW Note (Signed)
Transition of Care [facility]) - Inpatient Brief Assessment   Patient Details  Name: [name] MRN: [id number] Date of Birth: September 08, 1946  Transition of Care [facility] CM/SW Contact:    Larrie Kass, LCSW Phone Number: 08/28/2023, 11:50 AM   Clinical Narrative: Met with pt at bedside provided MOON form. Pt with no DME or transportation needs.TOC sign off.   Transition of Care Asessment: Insurance and Status: Insurance coverage has been reviewed Patient has primary care physician: Yes Home environment has been reviewed: home with self Prior level of function:: mod independent Prior/Current Home Services: No current home services Social Determinants of Health Reivew: SDOH reviewed no interventions necessary Readmission risk has been reviewed: Yes Transition of care needs: no transition of care needs at this time

## 2023-08-28 NOTE — Plan of Care (Signed)
  Problem: Nutrition: Goal: Adequate nutrition will be maintained Outcome: Progressing   Problem: Coping: Goal: Level of anxiety will decrease Outcome: Progressing   Problem: Elimination: Goal: Will not experience complications related to urinary retention Outcome: Progressing   Problem: Safety: Goal: Ability to remain free from injury will improve Outcome: Progressing   

## 2023-08-28 NOTE — Hospital Course (Addendum)
[name] is a 77 y.o. female with a history of second-degree AV block, anxiety, depression, atrial fibrillation, PUD, aortic arteriosclerosis, ascending aortic aneurysm, diastolic heart failure.  Patient presented secondary to shortness of breath and found to have evidence of acute diastolic heart failure in addition to COPD exacerbation. Heart failure complicated by high burden atrial fibrillation/flutter. Cardiology consulted. Patient diuresed and given steroids/breathing treatments with improvement of symptoms. Patient underwent a Transesophageal Echocardiogram with cardioversion per cardiology recommendations prior to discharge.

## 2023-08-28 NOTE — Progress Notes (Signed)
PROGRESS NOTE    [name]  ION:[id number] DOB: [birth year] DOA: 08/27/2023 PCP: [name], MD    Brief Narrative:   [name] is a 77 y.o. female with medical history  of second-degree AV block, , anxiety, depression, atrial fibrillation, carotid stenosis, chronic diastolic CHF, CKD, COPD, COVID-19,  rheumatic heart disease, status post AVR, status post MVR, lacunar infarction,, legally blind on left eye, lymphangioma, obesity, pulmonary artery hypertension presented to hospital with progressive shortness of breath with wheezing and cough.  In the ED, vitals with stable.  Labs showed no leukocytosis.  Coronavirus, influenza and RSV PCR was negative.   MCV of 101.9 fL and platelets 100.  albumin 3.1 g/dL.  BNP 294 pg/mL and troponin was 121 then 136 ng/L.  Chest x-ray showed cardiomegaly and mild interstitial edema compatible with CHF.  Left greater than right pleural effusions.  Bibasilar airspace disease likely reflect atelectasis.  Patient was then considered for admission to hospital for further evaluation and treatment.  Assessment and plan  Acute heart failure with preserved ejection fraction (HFpEF) Continue intake and output charting Daily weights, on Lasix 20 twice daily.  Has been started on lisinopril 2.5 mg daily.  Check 2D echocardiogram.  Acute COPD exacerbation COPD (chronic obstructive pulmonary disease) Continue oxygen, nebulizers Solu-Medrol.  On Keflex.  Closely monitor.  Continue IV diuretics.      Persistent atrial fibrillation CHA?DS?-VASc Score of at least 6.  On anticoagulation as outpatient.  Continue Eliquis.     Peripheral neuropathy Continue gabapentin     HLD (hyperlipidemia) Continue lovastatin or formulary equivalent.     Essential hypertension, benign On Lasix and low-dose lisinopril.     GERD (gastroesophageal reflux disease) Continue Protonix     Vitamin B12 deficiency Continue vitamin B12 orally      Thrombocytopenia Monitor platelet count.  Latest platelet count of 88       DVT prophylaxis:  apixaban (ELIQUIS) tablet 5 mg   Code Status:     Code Status: Full Code  Disposition: Uncertain at this time, will get PT OT evaluation.  Status is: Inpatient Remains inpatient appropriate because: Pending clinical improvement   Family Communication: None at bedside  Consultants:  None  Procedures:  None  Antimicrobials:  Keflex  Anti-infectives (From admission, onward)    Start     Dose/Rate Route Frequency Ordered Stop   08/28/23 0800  cephALEXin (KEFLEX) capsule 250 mg        250 mg Oral Daily with breakfast 08/27/23 1906        Subjective: Today, patient was seen and examined at bedside.  Complains of dyspnea on exertion, mild cough.  Objective: Vitals:   08/27/23 2306 08/28/23 0243 08/28/23 0500 08/28/23 0902  BP: 136/74 (!) 152/83    Pulse: 72 72    Resp:  14    Temp: 97.7 F (36.5 C) (!) 97.4 F (36.3 C)    TempSrc: Oral     SpO2: 91% 90%  93%  Weight:   67.6 kg   Height:        Intake/Output Summary (Last 24 hours) at 08/28/2023 1147 Last data filed at 08/28/2023 0842 Gross per 24 hour  Intake 940 ml  Output --  Net 940 ml   Filed Weights   08/27/23 1017 08/27/23 1505 08/28/23 0500  Weight: 68.9 kg 67.6 kg 67.6 kg    Physical Examination: Body mass index is 25.58 kg/m.   General:  Average built, not in obvious distress, elderly  female HENT:   No scleral pallor or icterus noted. Oral mucosa is moist.  Chest:  Clear breath sounds.  No crackles or wheezes.  CVS: S1 &S2 heard. No murmur.  Regular rate and rhythm. Abdomen: Soft, nontender, nondistended.  Bowel sounds are heard.   Extremities: No cyanosis, clubbing with bilateral lower extremity erythema.   Psych: Alert, awake and oriented, normal mood CNS:  No cranial nerve deficits.  Power equal in all extremities.   Skin: Warm and dry.  Bilateral lower extremity erythema.  Data Reviewed:    CBC: Recent Labs  Lab 08/23/23 0649 08/27/23 1052 08/28/23 0405  WBC 8.5 5.9 3.5*  NEUTROABS 7.0 4.8  --   HGB 11.5* 10.5* 10.5*  HCT 37.2 32.5* 33.0*  MCV 103.6* 101.9* 101.2*  PLT 100* 100* 88*    Basic Metabolic Panel: Recent Labs  Lab 08/27/23 1052 08/28/23 0405  NA 137 136  K 3.7 4.3  CL 101 99  CO2 28 31  GLUCOSE 101* 125*  BUN 15 20  CREATININE 0.88 0.89  CALCIUM 9.0 9.1    Liver Function Tests: Recent Labs  Lab 08/27/23 1052 08/28/23 0405  AST 15 16  ALT 9 10  ALKPHOS 60 62  BILITOT 0.9 0.6  PROT 6.5 6.4*  ALBUMIN 3.1* 3.0*     Radiology Studies: DG Chest Port 1 View  Result Date: 08/27/2023 CLINICAL DATA:  Shortness of breath. EXAM: PORTABLE CHEST 1 VIEW COMPARISON:  Two-view chest x-ray 10/09/2022 FINDINGS: The heart is enlarged. Atherosclerotic calcifications are present at the aortic arch. Left greater than right pleural effusions have increased. Mild interstitial edema is present. Bibasilar airspace opacities are noted, left greater than right. IMPRESSION: 1. Cardiomegaly and mild interstitial edema compatible with congestive heart failure. 2. Left greater than right pleural effusions. 3. Bibasilar airspace disease likely reflects atelectasis. Electronically Signed   By: Marin Roberts [name]   On: 08/27/2023 11:14      LOS: 0 days    Joycelyn Das, MD [facility] Available via Epic secure chat 7am-7pm After these hours, please refer to coverage provider listed on [email or web address] 08/28/2023, 11:47 AM

## 2023-08-29 ENCOUNTER — Encounter (HOSPITAL_COMMUNITY)
Admission: EM | Disposition: A | Discharge status: Home / Self Care | Payer: Self-pay | Source: Home / Self Care | Attending: Internal Medicine

## 2023-08-29 ENCOUNTER — Inpatient Hospital Stay (HOSPITAL_COMMUNITY): Payer: Medicare Other | Admitting: Anesthesiology

## 2023-08-29 ENCOUNTER — Encounter (HOSPITAL_COMMUNITY): Payer: Self-pay | Admitting: Cardiology

## 2023-08-29 ENCOUNTER — Inpatient Hospital Stay (HOSPITAL_COMMUNITY)
Admit: 2023-08-29 | Discharge: 2023-08-29 | Disposition: A | Discharge status: Home / Self Care | Payer: Medicare Other | Attending: Cardiology | Admitting: Cardiology

## 2023-08-29 DIAGNOSIS — I483 Typical atrial flutter: Secondary | ICD-10-CM | POA: Diagnosis not present

## 2023-08-29 DIAGNOSIS — I5031 Acute diastolic (congestive) heart failure: Secondary | ICD-10-CM | POA: Diagnosis not present

## 2023-08-29 DIAGNOSIS — J441 Chronic obstructive pulmonary disease with (acute) exacerbation: Secondary | ICD-10-CM | POA: Diagnosis not present

## 2023-08-29 DIAGNOSIS — I4891 Unspecified atrial fibrillation: Secondary | ICD-10-CM | POA: Diagnosis not present

## 2023-08-29 DIAGNOSIS — I342 Nonrheumatic mitral (valve) stenosis: Secondary | ICD-10-CM

## 2023-08-29 DIAGNOSIS — J449 Chronic obstructive pulmonary disease, unspecified: Secondary | ICD-10-CM | POA: Diagnosis not present

## 2023-08-29 DIAGNOSIS — I4892 Unspecified atrial flutter: Secondary | ICD-10-CM

## 2023-08-29 DIAGNOSIS — I10 Essential (primary) hypertension: Secondary | ICD-10-CM | POA: Diagnosis not present

## 2023-08-29 HISTORY — PX: TRANSESOPHAGEAL ECHOCARDIOGRAM (CATH LAB): EP1270

## 2023-08-29 HISTORY — PX: CARDIOVERSION: EP1203

## 2023-08-29 LAB — CBC
HCT: 33.3 % — ABNORMAL LOW (ref 36.0–46.0)
Hemoglobin: 10.5 g/dL — ABNORMAL LOW (ref 12.0–15.0)
MCH: 32.3 pg (ref 26.0–34.0)
MCHC: 31.5 g/dL (ref 30.0–36.0)
MCV: 102.5 fL — ABNORMAL HIGH (ref 80.0–100.0)
Platelets: 90 10*3/uL — ABNORMAL LOW (ref 150–400)
RBC: 3.25 MIL/uL — ABNORMAL LOW (ref 3.87–5.11)
RDW: 14.5 % (ref 11.5–15.5)
WBC: 5.6 10*3/uL (ref 4.0–10.5)
nRBC: 0 % (ref 0.0–0.2)

## 2023-08-29 LAB — BASIC METABOLIC PANEL
Anion gap: 9 (ref 5–15)
BUN: 25 mg/dL — ABNORMAL HIGH (ref 8–23)
CO2: 30 mmol/L (ref 22–32)
Calcium: 9.2 mg/dL (ref 8.9–10.3)
Chloride: 97 mmol/L — ABNORMAL LOW (ref 98–111)
Creatinine, Ser: 0.69 mg/dL (ref 0.44–1.00)
GFR, Estimated: >60 mL/min (ref >60)
Glucose, Bld: 110 mg/dL — ABNORMAL HIGH (ref 70–99)
Potassium: 4.1 mmol/L (ref 3.5–5.1)
Sodium: 136 mmol/L (ref 135–145)

## 2023-08-29 LAB — MAGNESIUM: Magnesium: 2.2 mg/dL (ref 1.7–2.4)

## 2023-08-29 LAB — ECHO TEE
AV Mean grad: 12 mm[Hg]
AV Peak grad: 19 mm[Hg]
Ao pk vel: 2.18 m/s

## 2023-08-29 SURGERY — TRANSESOPHAGEAL ECHOCARDIOGRAM (TEE) (CATHLAB)
Anesthesia: Monitor Anesthesia Care

## 2023-08-29 MED ORDER — LIDOCAINE 2% (20 MG/ML) 5 ML SYRINGE
INTRAMUSCULAR | Status: DC | PRN
  Administered 2023-08-29: 60 mg via INTRAVENOUS

## 2023-08-29 MED ORDER — EMPAGLIFLOZIN 10 MG PO TABS: 10.0000 mg | ORAL_TABLET | Freq: Every day | ORAL | Status: DC

## 2023-08-29 MED ORDER — FUROSEMIDE 40 MG PO TABS
40.0000 mg | ORAL_TABLET | Freq: Every day | ORAL | Status: DC
  Administered 2023-08-30: 40 mg via ORAL
  Filled 2023-08-29: qty 1

## 2023-08-29 MED ORDER — PROPOFOL 10 MG/ML IV BOLUS
INTRAVENOUS | Status: DC | PRN
  Administered 2023-08-29 (×2): 20 mg via INTRAVENOUS
  Administered 2023-08-29: 50 mg via INTRAVENOUS
  Administered 2023-08-29: 30 mg via INTRAVENOUS

## 2023-08-29 MED ORDER — SODIUM CHLORIDE 0.9% FLUSH
10.0000 mL | Freq: Two times a day (BID) | INTRAVENOUS | Status: DC
  Administered 2023-08-29: 10 mL via INTRAVENOUS

## 2023-08-29 MED ORDER — APIXABAN 5 MG PO TABS
ORAL_TABLET | ORAL | Status: AC
  Filled 2023-08-29: qty 1

## 2023-08-29 SURGICAL SUPPLY — 1 items: PAD DEFIB RADIO PHYSIO CONN (PAD) ×1 IMPLANT

## 2023-08-29 NOTE — Interval H&P Note (Signed)
History and Physical Interval Note:  08/29/2023 8:37 AM  [name]  has presented today for surgery, with the diagnosis of afib.  The various methods of treatment have been discussed with the patient and family. After consideration of risks, benefits and other options for treatment, the patient has consented to  Procedure(s): TRANSESOPHAGEAL ECHOCARDIOGRAM (N/A) CARDIOVERSION (CATH LAB) (N/A) as a surgical intervention.  The patient's history has been reviewed, patient examined, no change in status, stable for surgery.  I have reviewed the patient's chart and labs.  Questions were answered to the patient's satisfaction.     Olga Millers

## 2023-08-29 NOTE — Transfer of Care (Signed)
Immediate Anesthesia Transfer of Care Note  Patient: [name]  Procedure(s) Performed: TRANSESOPHAGEAL ECHOCARDIOGRAM CARDIOVERSION (CATH LAB)  Patient Location: PACU  Anesthesia Type:MAC  Level of Consciousness: drowsy  Airway & Oxygen Therapy: Patient Spontanous Breathing and Patient connected to nasal cannula oxygen  Post-op Assessment: Report given to RN and Post -op Vital signs reviewed and stable  Post vital signs: Reviewed and stable  Last Vitals:  Vitals Value Taken Time  BP    Temp    Pulse    Resp    SpO2      Last Pain:  Vitals:   08/29/23 0845  TempSrc: Temporal  PainSc: 0-No pain         Complications: No notable events documented.

## 2023-08-29 NOTE — Progress Notes (Signed)
     Transesophageal Echocardiogram Note  Eriona Marianne Peitz [id number] 1946/08/17  Procedure: Transesophageal Echocardiogram Indications: Atrial flutter  Procedure Details Consent: Obtained Time Out: Verified patient identification, verified procedure, site/side was marked, verified correct patient position, special equipment/implants available, Radiology Safety Procedures followed,  medications/allergies/relevent history reviewed, required imaging and test results available.  Performed  Medications:  Pt sedated by anesthesia with diprovan 120 mg IV total.  Vigorous LV function; severe LVH; moderate to severe biatrial enlargement; no LAA thrombus; mild RVE; s/p AVR (mean gradient 12 mmHg and no AI); s/p MVR (mean gradient 7 mmHg; trace MR); moderate TR; PFO noted.  Pt subsequently underwent DCCV with 200J to ventricular pacing with underlying sinus rhythm.   Complications: No apparent complications Patient did tolerate procedure well.  Olga Millers, MD

## 2023-08-29 NOTE — Progress Notes (Signed)
Heart Failure Navigator Progress Note  Assessed for Heart & Vascular TOC clinic readiness.  Patient does not meet criteria due to EF 60-65% , has a scheduled [facility] follow up on 09/05/2023. .   Navigator will sign off at this time.   Rhae Hammock, BSN, Scientist, clinical (histocompatibility and immunogenetics) Only

## 2023-08-29 NOTE — Progress Notes (Signed)
Mobility Specialist - Progress Note  Pre-mobility: 77 bpm HR, 92% SpO2 During mobility: 112 bpm HR, 93% SpO2 Post-mobility: 94 bpm HR, 91% SPO2   08/29/23 1411  Mobility  Activity Ambulated with assistance in hallway  Level of Assistance Modified independent, requires aide device or extra time  Assistive Device Four point cane  Distance Ambulated (ft) 100 ft  Range of Motion/Exercises Active  Activity Response Tolerated well  Mobility Referral Yes  $Mobility charge 1 Mobility  Mobility Specialist Start Time (ACUTE ONLY) 1400  Mobility Specialist Stop Time (ACUTE ONLY) 1411  Mobility Specialist Time Calculation (min) (ACUTE ONLY) 11 min   Pt was found in bed and agreeable to ambulate. C/o back pain during session which limited distance. At EOS returned to recliner chair with all needs met. Call bell in reach.  Billey Chang Mobility Specialist

## 2023-08-29 NOTE — Anesthesia Postprocedure Evaluation (Signed)
Anesthesia Post Note  Patient: [name]  Procedure(s) Performed: TRANSESOPHAGEAL ECHOCARDIOGRAM CARDIOVERSION (CATH LAB)     Patient location during evaluation: PACU Anesthesia Type: MAC Level of consciousness: awake and alert Pain management: pain level controlled Vital Signs Assessment: post-procedure vital signs reviewed and stable Respiratory status: spontaneous breathing, nonlabored ventilation, respiratory function stable and patient connected to nasal cannula oxygen Cardiovascular status: stable and blood pressure returned to baseline Postop Assessment: no apparent nausea or vomiting Anesthetic complications: no  No notable events documented.  Last Vitals:  Vitals:   08/29/23 0955 08/29/23 1110  BP: 127/70 138/82  Pulse: 70 77  Resp: 20 18  Temp: 36.7 C 36.6 C  SpO2: 99% 95%    Last Pain:  Vitals:   08/29/23 1110  TempSrc: Oral  PainSc: 0-No pain                 Kennieth Rad

## 2023-08-29 NOTE — Anesthesia Preprocedure Evaluation (Addendum)
Anesthesia Evaluation  Patient identified by MRN, date of birth, ID band Patient awake    Reviewed: Allergy & Precautions, NPO status , Patient's Chart, lab work & pertinent test results  History of Anesthesia Complications (+) PONV and history of anesthetic complications  Airway Mallampati: II  TM Distance: >3 FB Neck ROM: Full    Dental  (+) Dental Advisory Given, Teeth Intact   Pulmonary sleep apnea (does not use CPAP) , pneumonia, COPD,  COPD inhaler, former smoker   breath sounds clear to auscultation       Cardiovascular hypertension, Pt. on medications pulmonary hypertension(-) angina (-) dysrhythmias + pacemaker (St Jude dual chamber for 2:1 AVB) + Valvular Problems/Murmurs (S/P MVR and AVR 2015, mild MS)  Rhythm:Regular Rate:Normal + Systolic murmurs Echo 08/2023  1. Left ventricular ejection fraction, by estimation, is >75%. The left ventricle has hyperdynamic function. The left ventricle has no regional wall motion abnormalities. There is moderate left ventricular hypertrophy. Left ventricular diastolic  function could not be evaluated.   2. Right ventricular systolic function reduced. The right ventricular size is mildly enlarged. There is severely elevated pulmonary artery systolic pressure. The estimated right ventricular systolic pressure is 64.0 mmHg.   3. Left atrial size was mildly dilated.   4. Right atrial size was mildly dilated.   5. 25 mm [facility] Ease bioprosthetic valve in the mitral position (12/08/13), well-seated, no regurgitation, findings to suggest mitral valve stenosis (peak velocity 2.24m/s, MG , TVI ratio 3.5, EOA0.80cm2, HR 75bpm) but higher gradients can also be seen with increased flow through the mitral valve, increased HR,  prosthesis-patient mismatch. Subvalvular calcification present. Clinical correlation required.   6. Tricuspid valve regurgitation is moderate.   7. 21 mm magna  bioprosthetic valve in the aortic position (12/08/13), well-seated, no valvular stenosis or regurgitation (peak velocity 2.75m/s, MG , AVA 1.4cm2, AT<100 msec, DI 0.51).   8. Aortic dilatation noted. There is dilatation of the ascending aorta, measuring 40 mm.   9. The inferior vena cava is dilated in size with <50% respiratory variability, suggesting right atrial pressure of 15 mmHg.   Comparison(s): A prior study was performed on 10/06/2022. Prior  transmitral valve gradient (mean 10 mmHg) and transaortic gradient (mean  ).    Pacemaker interrogation 04/2023 Dr. Royann Shivers comments "Complete heart block: Pacemaker dependent. Battery status is good. Lead measurements are stable. Heart rate histogram is favorable. No clinically significant episodes of high ventricular rate or atrial mode switch noted.  Occasional brief episodes of atrial tachycardia, but atrial fibrillation has not been seen since October 2022."   Echo 09/2022  1. Left ventricular ejection fraction, by estimation, is 60 to 65%. The left ventricle has normal function. The left ventricle has no regional wall motion abnormalities. There is moderate concentric left ventricular hypertrophy. Left ventricular diastolic parameters are indeterminate.   2. Right ventricular systolic function is mildly reduced. The right ventricular size is moderately enlarged. There is moderately elevated pulmonary artery systolic pressure. The estimated right ventricular systolic pressure is 46.7 mmHg.   3. Left atrial size was severely dilated.   4. The mitral valve has been repaired/replaced. Trivial mitral valve regurgitation. There is a 25 mm Magna Ease bioprosthetic valve present in the mitral position. Procedure Date: 12/22/2013. The mean gradient is at HR 74, EOA 1.3cm2. 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   5. The aortic valve has been repaired/replaced. There is a 21 mm bioprosthetic valve present in the aortic position.  Procedure Date: 12/22/2013. Echo  findings are consistent with normal structure and function of the aortic valve prosthesis. Aortic valve mean gradient measures 14.0 mmHg. Aortic valve Vmax measures 2.56 m/s.  Trivial paravalvular leak   6. Aortic dilatation noted. There is mild dilatation of the ascending aorta, measuring 43 mm.   7. The inferior vena cava is dilated in size with >50% respiratory variability, suggesting right atrial pressure of 8 mmHg.   Comparison(s): Compared to prior TTE on 10/2021, there is no significant change. Prior mean mitral gradient 9.44mmHg, prior mean aortic gradient 12.49mmHg.      5/19 Stress: EF 57%, normal with no evidence of ischemia or prior MI '17 ECHO: EF 60-65%, AVR and MVR functioning normally   Neuro/Psych  PSYCHIATRIC DISORDERS  Depression    Chronic back pain    GI/Hepatic ,GERD  Controlled,,H/o pancreatitis H/o SBO   Endo/Other  negative endocrine ROS    Renal/GU Renal disease     Musculoskeletal  (+) Arthritis ,    Abdominal   Peds  Hematology  (+) Blood dyscrasia, anemia xarelto   Anesthesia Other Findings Pt in Aflutter, pacer dependent with underlying ventricular rate 30  Reproductive/Obstetrics                             Anesthesia Physical Anesthesia Plan  ASA: 4  Anesthesia Plan: MAC   Post-op Pain Management: Minimal or no pain anticipated   Induction: Intravenous  PONV Risk Score and Plan: 4 or greater and Treatment may vary due to age or medical condition, Propofol infusion and TIVA  Airway Management Planned: Natural Airway  Additional Equipment:   Intra-op Plan:   Post-operative Plan:   Informed Consent: I have reviewed the patients History and Physical, chart, labs and discussed the procedure including the risks, benefits and alternatives for the proposed anesthesia with the patient or authorized representative who has indicated his/her understanding and acceptance.      Dental advisory given  Plan Discussed with: CRNA  Anesthesia Plan Comments: (Risks of anesthesia explained at length. This includes, but is not limited to, sore throat, damage to teeth, lips gums, tongue and vocal cords, nausea and vomiting, reactions to medications, stroke, heart attack, and death. All patient questions were answered and the patient wishes to proceed.   See PAT note from 9/19 by Sherlie Ban PA-C )        Anesthesia Quick Evaluation

## 2023-08-29 NOTE — Progress Notes (Addendum)
Progress Note  Patient Name: [name] Date of Encounter: 08/29/2023  Primary Cardiologist: Thurmon Fair, MD  Subjective   Overall feeling better, less SOB. Dr. Duke Salvia has recommended TEE/DCCV today. [facility] is here to get her. Had recent hemoccult positivity in Sept pending OP GI appt but had not seen any overt blood.  Inpatient Medications    Scheduled Meds:  apixaban  5 mg Oral BID   buPROPion  150 mg Oral Daily   cephALEXin  250 mg Oral Q breakfast   cyanocobalamin  500 mcg Oral Once per day on Monday Thursday   fluticasone furoate-vilanterol  1 puff Inhalation Daily   And   umeclidinium bromide  1 puff Inhalation Daily   furosemide  20 mg Intravenous BID   gabapentin  100 mg Oral Daily   gabapentin  300 mg Oral QHS   influenza vaccine adjuvanted  0.5 mL Intramuscular Tomorrow-1000   lisinopril  2.5 mg Oral Daily   methylPREDNISolone (SOLU-MEDROL) injection  40 mg Intravenous Daily   mirabegron ER  25 mg Oral Daily   montelukast  10 mg Oral Daily   potassium chloride  20 mEq Oral Daily   pravastatin  40 mg Oral q1800   psyllium  1 packet Oral Daily   traZODone  50 mg Oral QHS   Continuous Infusions:  PRN Meds: acetaminophen **OR** acetaminophen, ipratropium-albuterol, mouth rinse, polyethylene glycol, polyvinyl alcohol, traMADol   Vital Signs    Vitals:   08/28/23 1204 08/28/23 1959 08/29/23 0416 08/29/23 0756  BP: 134/68 (!) 134/59 (!) 143/81   Pulse: 70 70 69   Resp: 17 16 16 20   Temp: (!) 97.5 F (36.4 C) 98.1 F (36.7 C) 97.7 F (36.5 C)   TempSrc: Oral Oral Oral   SpO2: 95% 94% 96%   Weight:   69.8 kg   Height:        Intake/Output Summary (Last 24 hours) at 08/29/2023 0759 Last data filed at 08/29/2023 0411 Gross per 24 hour  Intake 660 ml  Output 1 ml  Net 659 ml      08/29/2023    4:16 AM 08/28/2023    5:00 AM 08/27/2023    3:05 PM  Last 3 Weights  Weight (lbs) 153 lb 14.1 oz 149 lb 0.5 oz 149 lb 0.5 oz  Weight (kg)  69.8 kg 67.6 kg 67.6 kg     Telemetry    Suspected atrial flutter with V pacing - Personally Reviewed  Physical Exam   GEN: No acute distress.  HEENT: Normocephalic, atraumatic, sclera non-icteric. Neck: No JVD or bruits. Cardiac: irregularly irregular, no murmurs, rubs, or gallops.  Respiratory: coarse BS otherwise no wheezing or rhonchi. Breathing is unlabored. GI: Soft, nontender, non-distended, BS +x 4. MS: no deformity. Extremities: No clubbing or cyanosis. No edema. Distal pedal pulses are 2+ and equal bilaterally. Neuro:  AAOx3. Follows commands. Psych:  Responds to questions appropriately with a normal affect.  Labs    High Sensitivity Troponin:   Recent Labs  Lab 08/27/23 1052 08/27/23 1305  TROPONINIHS 121* 136*      Cardiac EnzymesNo results for input(s): "TROPONINI" in the last 168 hours. No results for input(s): "TROPIPOC" in the last 168 hours.   Chemistry Recent Labs  Lab 08/27/23 1052 08/28/23 0405 08/29/23 0402  NA 137 136 136  K 3.7 4.3 4.1  CL 101 99 97*  CO2 28 31 30   GLUCOSE 101* 125* 110*  BUN 15 20 25*  CREATININE 0.88 0.89 0.69  CALCIUM 9.0 9.1 9.2  PROT 6.5 6.4*  --   ALBUMIN 3.1* 3.0*  --   AST 15 16  --   ALT 9 10  --   ALKPHOS 60 62  --   BILITOT 0.9 0.6  --   GFRNONAA >60 >60 >60  ANIONGAP 8 6 9      Hematology Recent Labs  Lab 08/27/23 1052 08/28/23 0405 08/29/23 0402  WBC 5.9 3.5* 5.6  RBC 3.19* 3.26* 3.25*  HGB 10.5* 10.5* 10.5*  HCT 32.5* 33.0* 33.3*  MCV 101.9* 101.2* 102.5*  MCH 32.9 32.2 32.3  MCHC 32.3 31.8 31.5  RDW 14.1 14.2 14.5  PLT 100* 88* 90*    BNP Recent Labs  Lab 08/27/23 1052  BNP 293.9*     DDimer No results for input(s): "DDIMER" in the last 168 hours.   Radiology    ECHOCARDIOGRAM COMPLETE  Result Date: 08/28/2023    ECHOCARDIOGRAM REPORT   Patient Name:   [name] Date of Exam: 08/28/2023 Medical Rec #:  [id number]            Height:       64.0 in Accession #:     [id number]           Weight:       149.0 lb Date of Birth:  08-21-1946            BSA:          1.726 m Patient Age:    77 years             BP:           152/83 mmHg Patient Gender: F                    HR:           63 bpm. Exam Location:  Inpatient Procedure: 2D Echo, Cardiac Doppler and Color Doppler Indications:    CHF-Acute Diastolic I50.31  History:        Patient has prior history of Echocardiogram examinations, most                 recent 10/06/2022. COPD, Mitral Valve Disease and Aortic Valve                 Disease, Arrythmias:Atrial Flutter; Signs/Symptoms:Murmur.  Sonographer:    Darlys Gales Referring Phys: [id number] DAVID MANUEL ORTIZ IMPRESSIONS  1. Left ventricular ejection fraction, by estimation, is >75%. The left ventricle has hyperdynamic function. The left ventricle has no regional wall motion abnormalities. There is moderate left ventricular hypertrophy. Left ventricular diastolic function could not be evaluated.  2. Right ventricular systolic function reduced. The right ventricular size is mildly enlarged. There is severely elevated pulmonary artery systolic pressure. The estimated right ventricular systolic pressure is 64.0 mmHg.  3. Left atrial size was mildly dilated.  4. Right atrial size was mildly dilated.  5. 25 mm [facility] Ease bioprosthetic valve in the mitral position (12/08/13), well-seated, no regurgitation, findings to suggest mitral valve stenosis (peak velocity 2.43m/s, MG , TVI ratio 3.5, EOA0.80cm2, HR 75bpm) but higher gradients can also  be seen with increased flow through the mitral valve, increased HR, prosthesis-patient mismatch. Subvalvular calcification present. Clinical correlation required.  6. Tricuspid valve regurgitation is moderate.  7. 21 mm magna bioprosthetic valve in the aortic position (12/08/13), well-seated, no valvular stenosis or regurgitation (peak velocity 2.21m/s, MG , AVA 1.4cm2, AT<100 msec, DI 0.51).  8. Aortic dilatation  noted. There is  dilatation of the ascending aorta, measuring 40 mm.  9. The inferior vena cava is dilated in size with <50% respiratory variability, suggesting right atrial pressure of 15 mmHg. Comparison(s): A prior study was performed on 10/06/2022. Prior transmitral valve gradient (mean 10 mmHg) and transaortic gradient (mean ). FINDINGS  Left Ventricle: Left ventricular ejection fraction, by estimation, is >75%. The left ventricle has hyperdynamic function. The left ventricle has no regional wall motion abnormalities. The left ventricular internal cavity size was normal in size. There is moderate left ventricular hypertrophy. Left ventricular diastolic function could not be evaluated due to mitral valve replacement. Left ventricular diastolic function could not be evaluated. Right Ventricle: The right ventricular size is mildly enlarged. Right vetricular wall thickness was not well visualized. Right ventricular systolic function reduced. There is severely elevated pulmonary artery systolic pressure. The tricuspid regurgitant  velocity is 3.50 m/s, and with an assumed right atrial pressure of 15 mmHg, the estimated right ventricular systolic pressure is 64.0 mmHg. Left Atrium: Left atrial size was mildly dilated. Right Atrium: Right atrial size was mildly dilated. Pericardium: There is no evidence of pericardial effusion. Mitral Valve: 25 mm [facility] Ease bioprosthetic valve in the mitral position (12/08/13), well-seated, no regurgitation, findings to suggest mitral valve stenosis (peak velocity 2.31m/s, MG , TVI ratio 3.5, EOA0.80cm2, HR 75bpm) but higher gradients can also be seen with increased flow through the mitral valve, increased HR, prosthesis-patient mismatch. Subvalvular calcification present. Clinical correlation required. MV peak gradient, 25.6 mmHg. The mean mitral valve gradient is 11.0 mmHg. Tricuspid Valve: The tricuspid valve is normal in structure. Tricuspid valve regurgitation is moderate . No  evidence of tricuspid stenosis. Aortic Valve: 21 mm magna bioprosthetic valve in the aortic position (12/08/13), well-seated, no valvular stenosis or regurgitation (peak velocity 2.17m/s, MG , AVA 1.4cm2, AT<100 msec, DI 0.51). Aortic valve mean gradient measures 13.0 mmHg. Aortic valve peak gradient measures 20.8 mmHg. Aortic valve area, by VTI measures 1.44 cm. Pulmonic Valve: The pulmonic valve was not well visualized. Pulmonic valve regurgitation is mild. No evidence of pulmonic stenosis. Aorta: Aortic dilatation noted. There is dilatation of the ascending aorta, measuring 40 mm. Venous: The inferior vena cava is dilated in size with less than 50% respiratory variability, suggesting right atrial pressure of 15 mmHg. IAS/Shunts: The interatrial septum was not well visualized. Additional Comments: A device lead is visualized.  LEFT VENTRICLE PLAX 2D LVIDd:         3.50 cm   Diastology LVIDs:         1.60 cm   LV e' medial:   3.70 cm/s LV PW:         1.40 cm   LV E/e' medial: 64.3 LV IVS:        1.40 cm LVOT diam:     1.90 cm LV SV:         59 LV SV Index:   34 LVOT Area:     2.84 cm  RIGHT VENTRICLE RV Basal diam:  4.50 cm RV Mid diam:    3.90 cm RV S prime:     7.83 cm/s TAPSE (M-mode): 1.4 cm LEFT ATRIUM             Index        RIGHT ATRIUM           Index LA Vol (A2C):   49.1 ml 28.44 ml/m  RA Area:     16.60 cm LA Vol (A4C):  74.3 ml 43.04 ml/m  RA Volume:   45.50 ml  26.35 ml/m LA Biplane Vol: 64.6 ml 37.42 ml/m  AORTIC VALVE AV Area (Vmax):    1.52 cm AV Area (Vmean):   1.32 cm AV Area (VTI):     1.44 cm AV Vmax:           228.00 cm/s AV Vmean:          170.000 cm/s AV VTI:            0.412 m AV Peak Grad:      20.8 mmHg AV Mean Grad:      13.0 mmHg LVOT Vmax:         122.00 cm/s LVOT Vmean:        79.400 cm/s LVOT VTI:          0.209 m LVOT/AV VTI ratio: 0.51  AORTA Ao Asc diam: 4.00 cm MITRAL VALVE                TRICUSPID VALVE MV Area (PHT): 2.55 cm     TR Peak grad:   49.0 mmHg MV Area  VTI:   0.80 cm     TR Vmax:        350.00 cm/s MV Peak grad:  25.6 mmHg MV Mean grad:  11.0 mmHg    SHUNTS MV Vmax:       2.53 m/s     Systemic VTI:  0.21 m MV Vmean:      153.0 cm/s   Systemic Diam: 1.90 cm MV VTI:        0.74 m MV Decel Time: 298 msec MV E velocity: 238.00 cm/s MV A velocity: 152.00 cm/s MV E/A ratio:  1.57 Sunit Tolia Electronically signed by Tessa Lerner Signature Date/Time: 08/28/2023/4:15:29 PM    Final    DG Chest Port 1 View  Result Date: 08/27/2023 CLINICAL DATA:  Shortness of breath. EXAM: PORTABLE CHEST 1 VIEW COMPARISON:  Two-view chest x-ray 10/09/2022 FINDINGS: The heart is enlarged. Atherosclerotic calcifications are present at the aortic arch. Left greater than right pleural effusions have increased. Mild interstitial edema is present. Bibasilar airspace opacities are noted, left greater than right. IMPRESSION: 1. Cardiomegaly and mild interstitial edema compatible with congestive heart failure. 2. Left greater than right pleural effusions. 3. Bibasilar airspace disease likely reflects atelectasis. Electronically Signed   By: Marin Roberts [name]   On: 08/27/2023 11:14    Cardiac Studies   2d echo 08/28/23   1. Left ventricular ejection fraction, by estimation, is >75%. The left  ventricle has hyperdynamic function. The left ventricle has no regional  wall motion abnormalities. There is moderate left ventricular hypertrophy.  Left ventricular diastolic  function could not be evaluated.   2. Right ventricular systolic function reduced. The right ventricular  size is mildly enlarged. There is severely elevated pulmonary artery  systolic pressure. The estimated right ventricular systolic pressure is  64.0 mmHg.   3. Left atrial size was mildly dilated.   4. Right atrial size was mildly dilated.   5. 25 mm [facility] Ease bioprosthetic valve in the mitral position  (12/08/13), well-seated, no regurgitation, findings to suggest mitral valve  stenosis (peak  velocity 2.37m/s, MG , TVI ratio 3.5, EOA0.80cm2, HR  75bpm) but higher gradients can also   be seen with increased flow through the mitral valve, increased HR,  prosthesis-patient mismatch. Subvalvular calcification present. Clinical  correlation required.   6. Tricuspid valve regurgitation is moderate.   7.  21 mm magna bioprosthetic valve in the aortic position (12/08/13),  well-seated, no valvular stenosis or regurgitation (peak velocity 2.13m/s,  MG , AVA 1.4cm2, AT<100 msec, DI 0.51).   8. Aortic dilatation noted. There is dilatation of the ascending aorta,  measuring 40 mm.   9. The inferior vena cava is dilated in size with <50% respiratory  variability, suggesting right atrial pressure of 15 mmHg.   Patient Profile     77 y.o. 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 replacement in 2015, MV bioprosthetic stenosis (TEE without clear thrombus but suspected, prompting anticoagulation transition from Xarelto to warfarin with resolution, s/p transition to Eliquis 08/2021 due to erratic INR/easy bleeding & bruising), prior transient amaurosis fugax during period of subtherapeutic INR, small PFO, aortic atherosclerosis, minimal CAD by cath 2015 (30% distal LCx otherwise minimal luminal irregularities), COPD, hyperlipidemia, 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 She presented with worsening SOB felt to have a/c CHF.  Assessment & Plan     1. Persistent atrial fib/flutter and V pacing (h/o  PPM) - per sign out from colleague Clayburn Pert, interrogation had shown persistent afib over the last 10 days, aflutter (as could be seen on tele), and 21% burden since last interrogation - Dr. Duke Salvia has recommended TEE/DCCV  today (patient was off Eliquis briefly around time of bone marrow biopsy 11/12-11/14, did not receive instructions to do so but had self-held) - had prior syncope with bisoprolol - med plans TBD post DCCV - check baseline TSH - anticipate EP f/u as OP given known hx of arrhythmia  Informed Consent   Shared Decision Making/Informed Consent   The risks [stroke, cardiac arrhythmias rarely resulting in the need for a temporary or permanent pacemaker, skin irritation or burns, esophageal damage, perforation (1:10,000 risk), bleeding, pharyngeal hematoma as well as other potential complications associated with conscious sedation including aspiration, arrhythmia, respiratory failure and death], benefits (treatment guidance, restoration of normal sinus rhythm, diagnostic support) and alternatives of a transesophageal echocardiogram guided cardioversion were discussed in detail with Ms. Petermann and she is willing to proceed.     2. Anemia/thrombocytopenia - recent BMB with "Variably cellular bone marrow with trilineage hematopoiesis" without clear malignancy noted; patient pending OP GI eval for occult blood - d/w Dr. Duke Salvia - given relative stability of CBC recently, pursue TEE DCCV as above - will need to continue to follow closely  3. 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 - 2D echo yesterday with EF >75%, moderate LVH, severely elevated pulmoanry pressure, mildly enlarged RV, mild BAE, 25 mm [facility] Ease bioprosthetic valve in the mitral position  (12/08/13), well-seated, no regurgitation, findings to suggest mitral valve  stenosis (peak velocity 2.86m/s, MG , TVI ratio 3.5, EOA0.80cm2, HR 75bpm) but higher gradients can also  be seen with increased  flow through the mitral valve, increased HR, prosthesis-patient mismatch, moderate TR, no AS/AI, dilated IVC, dilation of ascending aorta 40mm - continue IV Lasix for now pending med plans post DCCV - I/O's not charted - ? Further w/o for severe pHTN - previously moderate, may be driven in part due  to her fluid retention on admission superimposed on baseline COPD - anticipate need to continue to f/u pulm as well  4.  Rheumatic valvular disease with severe AS/MS s/p bioprosthetic AV replacement and MV replacement in 2015, MV bioprosthetic stenosis 2020 (TEE at that time without clear thrombus but suspected, prompting anticoagulation transition from Xarelto to warfarin with resolution, s/p transition to Eliquis 08/2021 due to erratic INR/easy bleeding & bruising - echo this admission as above, also pending TEE today  5. Elevated troponin - suspected demand ischemia in context of above - minimal CAD by cath 2015 - continue statin therapy  Patient seen briefly pending transfer to [facility] for TEE DCCV today. MD to follow.  For questions or updates, please contact [facility] Please consult [email or web address] for contact info under Cardiology/STEMI.  Signed, Laurann Montana, PA-C 08/29/2023, 7:59 AM

## 2023-08-29 NOTE — Plan of Care (Signed)
?  Problem: Education: ?Goal: Knowledge of General Education information will improve ?Description: Including pain rating scale, medication(s)/side effects and non-pharmacologic comfort measures ?Outcome: Progressing ?  ?Problem: Clinical Measurements: ?Goal: Ability to maintain clinical measurements within normal limits will improve ?Outcome: Progressing ?  ?Problem: Activity: ?Goal: Risk for activity intolerance will decrease ?Outcome: Progressing ?  ?Problem: Nutrition: ?Goal: Adequate nutrition will be maintained ?Outcome: Progressing ?  ?Problem: Elimination: ?Goal: Will not experience complications related to bowel motility ?Outcome: Progressing ?  ?Problem: Safety: ?Goal: Ability to remain free from injury will improve ?Outcome: Progressing ?  ?

## 2023-08-29 NOTE — Progress Notes (Signed)
PROGRESS NOTE    [name]  ZOX:[id number] DOB: May 07, 1946 DOA: 08/27/2023 PCP: [name], MD    Brief Narrative:   [name] is a 77 y.o. female with medical history  of second-degree AV block, , anxiety, depression, atrial fibrillation, carotid stenosis, chronic diastolic CHF, CKD, COPD, COVID-19,  rheumatic heart disease, status post AVR, status post MVR, lacunar infarction,, legally blind on left eye, lymphangioma, obesity, pulmonary artery hypertension presented to hospital with progressive shortness of breath with wheezing and cough.  In the ED, vitals with stable.  Labs showed no leukocytosis.  Coronavirus, influenza and RSV PCR was negative.   MCV of 101.9 fL and platelets 100.  albumin 3.1 g/dL.  BNP 294 pg/mL and troponin was 121 then 136 ng/L.  Chest x-ray showed cardiomegaly and mild interstitial edema compatible with CHF.  Left greater than right pleural effusions.  Bibasilar airspace disease likely reflect atelectasis.  Patient was then considered for admission to hospital for further evaluation and treatment.  During hospitalization, patient did have atrial flutter with high burden so cardiology was consulted for atrial flutter and CHF and underwent TEE with cardioversion on 08/29/2023.  Currently being managed by cardiology.  Assessment and plan  Acute heart failure with preserved ejection fraction (HFpEF) Continue intake and output charting Daily weights, on Lasix 20 milligram twice daily.  Has been started on lisinopril 2.5 mg daily.  TEE done today shows LV ejection fraction of 60 to 65% with LVH.  Patient also underwent cardioversion for atrial flutter.  Acute COPD exacerbation COPD (chronic obstructive pulmonary disease) Continue oxygen, nebulizers Solu-Medrol.  On Keflex.  Closely monitor.  Continue IV diuretics.      Persistent atrial fibrillation/atrial flutter  CHA?DS?-VASc Score of at least 6.  On anticoagulation with Eliquis.  As per  cardiology interrogation showed 21% burden of atrial flutter so patient underwent DC cardioversion by cardiology on 08/29/2023 by cardiology.     Peripheral neuropathy Continue gabapentin     HLD (hyperlipidemia) Continue statin.     Essential hypertension, benign On Lasix and low-dose lisinopril.     GERD (gastroesophageal reflux disease) Continue Protonix     Vitamin B12 deficiency Continue vitamin B12 orally     Thrombocytopenia Monitor platelet count.  Latest platelet count of 88       DVT prophylaxis:  apixaban (ELIQUIS) tablet 5 mg   Code Status:     Code Status: Full Code  Disposition: PT evaluation in AM.  Likely home in 1 to 2 days  Status is: Inpatient  Remains inpatient appropriate because: Status post TEE and cardioversion, cardiology on board, pending clinical improvement.   Family Communication: None at bedside  Consultants:  Cardiology   Procedures:  TEE with DC cardioversion on 08/29/2023  Antimicrobials:  Keflex  Anti-infectives (From admission, onward)    Start     Dose/Rate Route Frequency Ordered Stop   08/28/23 0800  cephALEXin (KEFLEX) capsule 250 mg        250 mg Oral Daily with breakfast 08/27/23 1906        Subjective: Today, patient was seen and examined at bedside.  Seen after TEE cardioversion.  Patient denies overt chest pain, dizziness, lightheadedness or shortness of breath after the procedure.   Objective: Vitals:   08/29/23 0756 08/29/23 0845 08/29/23 0955 08/29/23 1110  BP:  (!) 146/72 127/70 138/82  Pulse:  76 70 77  Resp: 20 13 20 18   Temp:  (!) 97.5 F (36.4 C) 98.1 F (36.7  C) 97.9 F (36.6 C)  TempSrc:  Temporal Temporal Oral  SpO2:  94% 99% 95%  Weight:      Height:        Intake/Output Summary (Last 24 hours) at 08/29/2023 1327 Last data filed at 08/29/2023 0700 Gross per 24 hour  Intake 220 ml  Output 1 ml  Net 219 ml   Filed Weights   08/27/23 1505 08/28/23 0500 08/29/23 0416  Weight: 67.6 kg  67.6 kg 69.8 kg    Physical Examination: Body mass index is 26.41 kg/m.  There  General:  Average built, not in obvious distress, elderly female on room air HENT:   No scleral pallor or icterus noted. Oral mucosa is moist.  Chest:  Clear breath sounds.  No crackles or wheezes.  CVS: S1 &S2 heard. No murmur.  Regular rate and rhythm.  Seen after cardioversion, chest wall scar noted Abdomen: Soft, nontender, nondistended.  Bowel sounds are heard.   Extremities: No cyanosis, clubbing with bilateral lower extremity erythema.   Psych: Alert, awake and oriented, normal mood CNS:  No cranial nerve deficits.  Power equal in all extremities.   Skin: Warm and dry.  Bilateral lower extremity erythema.  Data Reviewed:   CBC: Recent Labs  Lab 08/23/23 0649 08/27/23 1052 08/28/23 0405 08/29/23 0402  WBC 8.5 5.9 3.5* 5.6  NEUTROABS 7.0 4.8  --   --   HGB 11.5* 10.5* 10.5* 10.5*  HCT 37.2 32.5* 33.0* 33.3*  MCV 103.6* 101.9* 101.2* 102.5*  PLT 100* 100* 88* 90*    Basic Metabolic Panel: Recent Labs  Lab 08/27/23 1052 08/28/23 0405 08/29/23 0402  NA 137 136 136  K 3.7 4.3 4.1  CL 101 99 97*  CO2 28 31 30   GLUCOSE 101* 125* 110*  BUN 15 20 25*  CREATININE 0.88 0.89 0.69  CALCIUM 9.0 9.1 9.2  MG  --   --  2.2    Liver Function Tests: Recent Labs  Lab 08/27/23 1052 08/28/23 0405  AST 15 16  ALT 9 10  ALKPHOS 60 62  BILITOT 0.9 0.6  PROT 6.5 6.4*  ALBUMIN 3.1* 3.0*     Radiology Studies: ECHO TEE  Result Date: 08/29/2023    TRANSESOPHOGEAL ECHO REPORT   Patient Name:   [name] Date of Exam: 08/29/2023 Medical Rec #:  [id number]            Height:       64.0 in Accession #:    [id number]           Weight:       153.9 lb Date of Birth:  Oct 20, 1945            BSA:          1.750 m Patient Age:    77 years             BP:           138/70 mmHg Patient Gender: F                    HR:           59 bpm. Exam Location:  Inpatient Procedure: Transesophageal Echo,  3D Echo, Color Doppler and Cardiac Doppler Indications:     I48.91* Unspecified atrial fibrillation  History:         Patient has prior history of Echocardiogram examinations, most  recent 08/28/2023. Pacemaker. 12/12/13 AVR with 21mm Magna Ease                  bioprosthetic and MVR with 25mm Mitral Magna bioprosthetic.                  Aortic Valve: 21 mm Magna bioprosthetic valve is present in the                  aortic position. Procedure Date: 12/08/13.                  Mitral Valve: 25 mm Edwards MagnaEase bioprosthetic valve valve                  is present in the mitral position. Procedure Date: 12/08/13.  Sonographer:     Irving Burton Senior RDCS Referring Phys:  [id number] BRIAN S CRENSHAW Diagnosing Phys: Olga Millers MD PROCEDURE: After discussion of the risks and benefits of a TEE, an informed consent was obtained from the patient. The transesophogeal probe was passed without difficulty through the esophogus of the patient. Sedation performed by different physician. The patient was monitored while under deep sedation. Anesthestetic sedation was provided intravenously by Anesthesiology: 120mg  of Propofol, 60mg  of Lidocaine. The patient developed no complications during the procedure. A successful direct current cardioversion was performed at 200 joules with 1 attempt.  IMPRESSIONS  1. Left ventricular ejection fraction, by estimation, is 60 to 65%. The left ventricle has normal function. The left ventricle has no regional wall motion abnormalities. There is severe left ventricular hypertrophy.  2. Right ventricular systolic function is normal. The right ventricular size is mildly enlarged.  3. Left atrial size was moderately dilated. No left atrial/left atrial appendage thrombus was detected.  4. Right atrial size was moderately dilated.  5. The mitral valve has been repaired/replaced. Trivial mitral valve regurgitation. Mild to moderate mitral stenosis. There is a 25 mm Edwards MagnaEase bioprosthetic  valve present in the mitral position. Procedure Date: 12/08/13.  6. Tricuspid valve regurgitation is moderate.  7. The aortic valve has been repaired/replaced. Aortic valve regurgitation is not visualized. There is a 21 mm Magna bioprosthetic valve present in the aortic position. Procedure Date: 12/08/13.  8. Aortic dilatation noted. There is mild dilatation of the ascending aorta, measuring 41 mm. 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  9. Moderately dilated pulmonary artery. 10. Evidence of atrial level shunting detected by color flow Doppler. There is a small patent foramen ovale. FINDINGS  Left Ventricle: Left ventricular ejection fraction, by estimation, is 60 to 65%. The left ventricle has normal function. The left ventricle has no regional wall motion abnormalities. The left ventricular internal cavity size was small. There is severe left ventricular hypertrophy. Right Ventricle: The right ventricular size is mildly enlarged. Right ventricular systolic function is normal. Left Atrium: Left atrial size was moderately dilated. No left atrial/left atrial appendage thrombus was detected. Right Atrium: Right atrial size was moderately dilated. Pericardium: There is no evidence of pericardial effusion. Mitral Valve: The mitral valve has been repaired/replaced. Trivial mitral valve regurgitation. There is a 25 mm Edwards MagnaEase bioprosthetic valve present in the mitral position. Procedure Date: 12/08/13. Mild to moderate mitral valve stenosis. MV peak gradient, 14.3 mmHg. The mean mitral valve gradient is 7.2 mmHg. Tricuspid Valve: The tricuspid valve is normal in structure. Tricuspid valve regurgitation is moderate. Aortic Valve: The aortic valve has been repaired/replaced. Aortic valve regurgitation is not visualized. Aortic valve  mean gradient measures 12.0 mmHg. Aortic valve peak gradient measures 19.0 mmHg. There is a 21 mm Magna bioprosthetic valve present in the aortic position.  Procedure Date: 12/08/13. Pulmonic Valve: The pulmonic valve was normal in structure. Pulmonic valve regurgitation is not visualized. Aorta: Aortic dilatation noted. There is mild dilatation of the ascending aorta, measuring 41 mm. There is moderate (Grade III) plaque involving the descending aorta. Pulmonary Artery: The pulmonary artery is moderately dilated. IAS/Shunts: Evidence of atrial level shunting detected by color flow Doppler. A small patent foramen ovale is detected. Additional Comments: Spectral Doppler performed. AORTIC VALVE AV Vmax:           218.00 cm/s AV Vmean:          169.000 cm/s AV VTI:            0.442 m AV Peak Grad:      19.0 mmHg AV Mean Grad:      12.0 mmHg LVOT Vmax:         65.40 cm/s LVOT Vmean:        49.100 cm/s LVOT VTI:          0.116 m LVOT/AV VTI ratio: 0.26 MITRAL VALVE             TRICUSPID VALVE MV Peak grad: 14.3 mmHg  TR Peak grad:   43.6 mmHg MV Mean grad: 7.2 mmHg   TR Vmax:        330.00 cm/s MV Vmax:      1.89 m/s MV Vmean:     124.0 cm/s SHUNTS                          Systemic VTI: 0.12 m Olga Millers MD Electronically signed by Olga Millers MD Signature Date/Time: 08/29/2023/12:16:56 PM    Final    EP STUDY  Result Date: 08/29/2023 See surgical note for result.  ECHOCARDIOGRAM COMPLETE  Result Date: 08/28/2023    ECHOCARDIOGRAM REPORT   Patient Name:   [name] Date of Exam: 08/28/2023 Medical Rec #:  [id number]            Height:       64.0 in Accession #:    [id number]           Weight:       149.0 lb Date of Birth:  [birth year]            BSA:          1.726 m Patient Age:    77 years             BP:           152/83 mmHg Patient Gender: F                    HR:           63 bpm. Exam Location:  Inpatient Procedure: 2D Echo, Cardiac Doppler and Color Doppler Indications:    CHF-Acute Diastolic I50.31  History:        Patient has prior history of Echocardiogram examinations, most                 recent 10/06/2022. COPD, Mitral Valve Disease and  Aortic Valve                 Disease, Arrythmias:Atrial Flutter; Signs/Symptoms:Murmur.  Sonographer:    Darlys Gales Referring Phys: [id number] DAVID MANUEL ORTIZ IMPRESSIONS  1. Left ventricular ejection  fraction, by estimation, is >75%. The left ventricle has hyperdynamic function. The left ventricle has no regional wall motion abnormalities. There is moderate left ventricular hypertrophy. Left ventricular diastolic function could not be evaluated.  2. Right ventricular systolic function reduced. The right ventricular size is mildly enlarged. There is severely elevated pulmonary artery systolic pressure. The estimated right ventricular systolic pressure is 64.0 mmHg.  3. Left atrial size was mildly dilated.  4. Right atrial size was mildly dilated.  5. 25 mm [facility] Ease bioprosthetic valve in the mitral position (12/08/13), well-seated, no regurgitation, findings to suggest mitral valve stenosis (peak velocity 2.56m/s, MG , TVI ratio 3.5, EOA0.80cm2, HR 75bpm) but higher gradients can also  be seen with increased flow through the mitral valve, increased HR, prosthesis-patient mismatch. Subvalvular calcification present. Clinical correlation required.  6. Tricuspid valve regurgitation is moderate.  7. 21 mm magna bioprosthetic valve in the aortic position (12/08/13), well-seated, no valvular stenosis or regurgitation (peak velocity 2.10m/s, MG , AVA 1.4cm2, AT<100 msec, DI 0.51).  8. Aortic dilatation noted. There is dilatation of the ascending aorta, measuring 40 mm.  9. The inferior vena cava is dilated in size with <50% respiratory variability, suggesting right atrial pressure of 15 mmHg. Comparison(s): A prior study was performed on 10/06/2022. Prior transmitral valve gradient (mean 10 mmHg) and transaortic gradient (mean ). FINDINGS  Left Ventricle: Left ventricular ejection fraction, by estimation, is >75%. The left ventricle has hyperdynamic function. The left ventricle has no regional wall  motion abnormalities. The left ventricular internal cavity size was normal in size. There is moderate left ventricular hypertrophy. Left ventricular diastolic function could not be evaluated due to mitral valve replacement. Left ventricular diastolic function could not be evaluated. Right Ventricle: The right ventricular size is mildly enlarged. Right vetricular wall thickness was not well visualized. Right ventricular systolic function reduced. There is severely elevated pulmonary artery systolic pressure. The tricuspid regurgitant  velocity is 3.50 m/s, and with an assumed right atrial pressure of 15 mmHg, the estimated right ventricular systolic pressure is 64.0 mmHg. Left Atrium: Left atrial size was mildly dilated. Right Atrium: Right atrial size was mildly dilated. Pericardium: There is no evidence of pericardial effusion. Mitral Valve: 25 mm [facility] Ease bioprosthetic valve in the mitral position (12/08/13), well-seated, no regurgitation, findings to suggest mitral valve stenosis (peak velocity 2.33m/s, MG , TVI ratio 3.5, EOA0.80cm2, HR 75bpm) but higher gradients can also be seen with increased flow through the mitral valve, increased HR, prosthesis-patient mismatch. Subvalvular calcification present. Clinical correlation required. MV peak gradient, 25.6 mmHg. The mean mitral valve gradient is 11.0 mmHg. Tricuspid Valve: The tricuspid valve is normal in structure. Tricuspid valve regurgitation is moderate . No evidence of tricuspid stenosis. Aortic Valve: 21 mm magna bioprosthetic valve in the aortic position (12/08/13), well-seated, no valvular stenosis or regurgitation (peak velocity 2.11m/s, MG , AVA 1.4cm2, AT<100 msec, DI 0.51). Aortic valve mean gradient measures 13.0 mmHg. Aortic valve peak gradient measures 20.8 mmHg. Aortic valve area, by VTI measures 1.44 cm. Pulmonic Valve: The pulmonic valve was not well visualized. Pulmonic valve regurgitation is mild. No evidence of pulmonic  stenosis. Aorta: Aortic dilatation noted. There is dilatation of the ascending aorta, measuring 40 mm. Venous: The inferior vena cava is dilated in size with less than 50% respiratory variability, suggesting right atrial pressure of 15 mmHg. IAS/Shunts: The interatrial septum was not well visualized. Additional Comments: A device lead is visualized.  LEFT VENTRICLE PLAX 2D LVIDd:  3.50 cm   Diastology LVIDs:         1.60 cm   LV e' medial:   3.70 cm/s LV PW:         1.40 cm   LV E/e' medial: 64.3 LV IVS:        1.40 cm LVOT diam:     1.90 cm LV SV:         59 LV SV Index:   34 LVOT Area:     2.84 cm  RIGHT VENTRICLE RV Basal diam:  4.50 cm RV Mid diam:    3.90 cm RV S prime:     7.83 cm/s TAPSE (M-mode): 1.4 cm LEFT ATRIUM             Index        RIGHT ATRIUM           Index LA Vol (A2C):   49.1 ml 28.44 ml/m  RA Area:     16.60 cm LA Vol (A4C):   74.3 ml 43.04 ml/m  RA Volume:   45.50 ml  26.35 ml/m LA Biplane Vol: 64.6 ml 37.42 ml/m  AORTIC VALVE AV Area (Vmax):    1.52 cm AV Area (Vmean):   1.32 cm AV Area (VTI):     1.44 cm AV Vmax:           228.00 cm/s AV Vmean:          170.000 cm/s AV VTI:            0.412 m AV Peak Grad:      20.8 mmHg AV Mean Grad:      13.0 mmHg LVOT Vmax:         122.00 cm/s LVOT Vmean:        79.400 cm/s LVOT VTI:          0.209 m LVOT/AV VTI ratio: 0.51  AORTA Ao Asc diam: 4.00 cm MITRAL VALVE                TRICUSPID VALVE MV Area (PHT): 2.55 cm     TR Peak grad:   49.0 mmHg MV Area VTI:   0.80 cm     TR Vmax:        350.00 cm/s MV Peak grad:  25.6 mmHg MV Mean grad:  11.0 mmHg    SHUNTS MV Vmax:       2.53 m/s     Systemic VTI:  0.21 m MV Vmean:      153.0 cm/s   Systemic Diam: 1.90 cm MV VTI:        0.74 m MV Decel Time: 298 msec MV E velocity: 238.00 cm/s MV A velocity: 152.00 cm/s MV E/A ratio:  1.57 Sunit Product manager signed by Tessa Lerner Signature Date/Time: 08/28/2023/4:15:29 PM    Final       LOS: 1 day    Joycelyn Das, MD [facility] Available via Epic secure chat 7am-7pm After these hours, please refer to coverage provider listed on [email or web address] 08/29/2023, 1:27 PM

## 2023-08-30 ENCOUNTER — Telehealth: Payer: Self-pay

## 2023-08-30 ENCOUNTER — Other Ambulatory Visit: Payer: Self-pay

## 2023-08-30 ENCOUNTER — Encounter (HOSPITAL_COMMUNITY): Payer: Self-pay | Admitting: Cardiology

## 2023-08-30 DIAGNOSIS — I4892 Unspecified atrial flutter: Secondary | ICD-10-CM

## 2023-08-30 DIAGNOSIS — I5031 Acute diastolic (congestive) heart failure: Secondary | ICD-10-CM | POA: Diagnosis not present

## 2023-08-30 LAB — BASIC METABOLIC PANEL
Anion gap: 7 (ref 5–15)
BUN: 26 mg/dL — ABNORMAL HIGH (ref 8–23)
CO2: 28 mmol/L (ref 22–32)
Calcium: 8.9 mg/dL (ref 8.9–10.3)
Chloride: 97 mmol/L — ABNORMAL LOW (ref 98–111)
Creatinine, Ser: 0.84 mg/dL (ref 0.44–1.00)
GFR, Estimated: >60 mL/min (ref >60)
Glucose, Bld: 120 mg/dL — ABNORMAL HIGH (ref 70–99)
Potassium: 4.4 mmol/L (ref 3.5–5.1)
Sodium: 132 mmol/L — ABNORMAL LOW (ref 135–145)

## 2023-08-30 LAB — CBC
HCT: 36 % (ref 36.0–46.0)
Hemoglobin: 10.8 g/dL — ABNORMAL LOW (ref 12.0–15.0)
MCH: 31.5 pg (ref 26.0–34.0)
MCHC: 30 g/dL (ref 30.0–36.0)
MCV: 105 fL — ABNORMAL HIGH (ref 80.0–100.0)
Platelets: 90 10*3/uL — ABNORMAL LOW (ref 150–400)
RBC: 3.43 MIL/uL — ABNORMAL LOW (ref 3.87–5.11)
RDW: 14.4 % (ref 11.5–15.5)
WBC: 5.8 10*3/uL (ref 4.0–10.5)
nRBC: 0 % (ref 0.0–0.2)

## 2023-08-30 LAB — TSH: TSH: 0.701 u[IU]/mL (ref 0.350–4.500)

## 2023-08-30 LAB — MAGNESIUM: Magnesium: 2.2 mg/dL (ref 1.7–2.4)

## 2023-08-30 MED ORDER — LISINOPRIL 2.5 MG PO TABS: 2.5000 mg | ORAL_TABLET | Freq: Every day | ORAL | 2 refills | Status: DC

## 2023-08-30 NOTE — Plan of Care (Signed)
  Problem: Education: Goal: Knowledge of General Education information will improve Description: Including pain rating scale, medication(s)/side effects and non-pharmacologic comfort measures 08/30/2023 1533 by Brandon Melnick, RN Outcome: Completed/Met 08/30/2023 1301 by Brandon Melnick, RN Outcome: Progressing   Problem: Health Behavior/Discharge Planning: Goal: Ability to manage health-related needs will improve Outcome: Completed/Met   Problem: Clinical Measurements: Goal: Ability to maintain clinical measurements within normal limits will improve Outcome: Completed/Met Goal: Will remain free from infection Outcome: Completed/Met Goal: Diagnostic test results will improve Outcome: Completed/Met Goal: Respiratory complications will improve Outcome: Completed/Met Goal: Cardiovascular complication will be avoided Outcome: Completed/Met   Problem: Activity: Goal: Risk for activity intolerance will decrease 08/30/2023 1533 by Brandon Melnick, RN Outcome: Completed/Met 08/30/2023 1301 by Brandon Melnick, RN Outcome: Progressing   Problem: Nutrition: Goal: Adequate nutrition will be maintained 08/30/2023 1533 by Brandon Melnick, RN Outcome: Completed/Met 08/30/2023 1301 by Brandon Melnick, RN Outcome: Progressing   Problem: Coping: Goal: Level of anxiety will decrease 08/30/2023 1533 by Brandon Melnick, RN Outcome: Completed/Met 08/30/2023 1301 by Brandon Melnick, RN Outcome: Progressing   Problem: Elimination: Goal: Will not experience complications related to bowel motility Outcome: Completed/Met Goal: Will not experience complications related to urinary retention 08/30/2023 1533 by Brandon Melnick, RN Outcome: Completed/Met 08/30/2023 1301 by Brandon Melnick, RN Outcome: Progressing   Problem: Pain Management: Goal: General experience of comfort will improve 08/30/2023 1533 by Brandon Melnick, RN Outcome:  Completed/Met 08/30/2023 1301 by Brandon Melnick, RN Outcome: Progressing   Problem: Safety: Goal: Ability to remain free from injury will improve 08/30/2023 1533 by Brandon Melnick, RN Outcome: Completed/Met 08/30/2023 1301 by Brandon Melnick, RN Outcome: Progressing   Problem: Skin Integrity: Goal: Risk for impaired skin integrity will decrease 08/30/2023 1533 by Brandon Melnick, RN Outcome: Completed/Met 08/30/2023 1301 by Brandon Melnick, RN Outcome: Progressing   Problem: Education: Goal: Ability to demonstrate management of disease process will improve Outcome: Completed/Met Goal: Ability to verbalize understanding of medication therapies will improve Outcome: Completed/Met Goal: Individualized Educational Video(s) Outcome: Completed/Met   Problem: Activity: Goal: Capacity to carry out activities will improve 08/30/2023 1533 by Brandon Melnick, RN Outcome: Completed/Met 08/30/2023 1301 by Brandon Melnick, RN Outcome: Progressing   Problem: Cardiac: Goal: Ability to achieve and maintain adequate cardiopulmonary perfusion will improve Outcome: Completed/Met   Problem: Education: Goal: Knowledge of disease or condition will improve Outcome: Completed/Met Goal: Knowledge of the prescribed therapeutic regimen will improve Outcome: Completed/Met Goal: Individualized Educational Video(s) Outcome: Completed/Met   Problem: Activity: Goal: Ability to tolerate increased activity will improve 08/30/2023 1533 by Brandon Melnick, RN Outcome: Completed/Met 08/30/2023 1301 by Brandon Melnick, RN Outcome: Progressing Goal: Will verbalize the importance of balancing activity with adequate rest periods Outcome: Completed/Met   Problem: Respiratory: Goal: Ability to maintain a clear airway will improve 08/30/2023 1533 by Brandon Melnick, RN Outcome: Completed/Met 08/30/2023 1301 by Brandon Melnick, RN Outcome: Progressing Goal:  Levels of oxygenation will improve Outcome: Completed/Met Goal: Ability to maintain adequate ventilation will improve Outcome: Completed/Met

## 2023-08-30 NOTE — TOC Transition Note (Signed)
Transition of Care [facility]) - CM/SW Discharge Note   Patient Details  Name: [name] MRN: [id number] Date of Birth: 01/20/1946  [facility] CM/SW Contact:  Larrie Kass, LCSW Phone Number: 08/30/2023, 3:08 PM   Clinical Narrative:     Pt was recommended for OP PT. CSW spoke with pt who reports she has her OPPT sessions arranged with [facility]. No further TOC needs , TOC sign off.         Patient Goals and CMS Choice      Discharge Placement                         Discharge Plan and Services Additional resources added to the After Visit Summary for                                       Social Determinants of Health (SDOH) Interventions SDOH Screenings   Food Insecurity: No Food Insecurity (08/27/2023)  Housing: Low Risk  (08/27/2023)  Transportation Needs: No Transportation Needs (08/27/2023)  Utilities: Not At Risk (08/27/2023)  Alcohol Screen: Low Risk  (01/20/2023)  Depression (PHQ2-9): Low Risk  (08/17/2023)  Recent Concern: Depression (PHQ2-9) - Medium Risk (06/28/2023)  Financial Resource Strain: Low Risk  (01/20/2023)  Physical Activity: Unknown (01/20/2023)  Social Connections: Moderately Integrated (01/20/2023)  Stress: No Stress Concern Present (01/20/2023)  Tobacco Use: Medium Risk (08/29/2023)     Readmission Risk Interventions     No data to display

## 2023-08-30 NOTE — Transitions of Care (Post Inpatient/ED Visit) (Signed)
   08/30/2023  Name: Chakera Venturo MRN: [id number] DOB: [birth year]  Today's TOC FU Call Status:    Attempted to reach the patient regarding the most recent Inpatient/ED visit.  Follow Up Plan: Additional outreach attempts will be made to reach the patient to complete the Transitions of Care (Post Inpatient/ED visit) call.   The patient is still in the hospital  Deidre Ala, RN RN Care Manager [facility] [phone]

## 2023-08-30 NOTE — Progress Notes (Signed)
Progress Note  Patient Name: [name] Date of Encounter: 08/30/2023  Primary Cardiologist: Thurmon Fair, MD  Subjective   Eager to go home today. Denies CP or SOB.  Inpatient Medications    Scheduled Meds:  apixaban  5 mg Oral BID   buPROPion  150 mg Oral Daily   cephALEXin  250 mg Oral Q breakfast   cyanocobalamin  500 mcg Oral Once per day on Monday Thursday   fluticasone furoate-vilanterol  1 puff Inhalation Daily   And   umeclidinium bromide  1 puff Inhalation Daily   furosemide  40 mg Oral Daily   gabapentin  100 mg Oral Daily   gabapentin  300 mg Oral QHS   lisinopril  2.5 mg Oral Daily   methylPREDNISolone (SOLU-MEDROL) injection  40 mg Intravenous Daily   mirabegron ER  25 mg Oral Daily   montelukast  10 mg Oral Daily   potassium chloride  20 mEq Oral Daily   pravastatin  40 mg Oral q1800   psyllium  1 packet Oral Daily   traZODone  50 mg Oral QHS   Continuous Infusions:  PRN Meds: acetaminophen **OR** acetaminophen, ipratropium-albuterol, mouth rinse, polyethylene glycol, polyvinyl alcohol, traMADol   Vital Signs    Vitals:   08/29/23 2242 08/30/23 0422 08/30/23 0910 08/30/23 0950  BP: 130/74 (!) 148/78  139/70  Pulse: 78 78    Resp: 16 18    Temp: 97.8 F (36.6 C) (!) 97.5 F (36.4 C)    TempSrc: Oral Oral    SpO2: 94% 93% 94%   Weight:  70.1 kg    Height:        Intake/Output Summary (Last 24 hours) at 08/30/2023 1016 Last data filed at 08/29/2023 1940 Gross per 24 hour  Intake 880 ml  Output 300 ml  Net 580 ml      08/30/2023    4:22 AM 08/29/2023    4:16 AM 08/28/2023    5:00 AM  Last 3 Weights  Weight (lbs) 154 lb 8.7 oz 153 lb 14.1 oz 149 lb 0.5 oz  Weight (kg) 70.1 kg 69.8 kg 67.6 kg     Telemetry    Atrial paced, V paced. Occasional atrial sensed (NSR) V pacing as well - Personally Reviewed  ECG    A paced, V paced with occ ectopy, 74bpm - Personally Reviewed  Physical Exam   GEN: No acute distress.   HEENT: Normocephalic, atraumatic, sclera non-icteric. Neck: No JVD or bruits. Cardiac: RRR no murmurs, rubs, or gallops.  Respiratory: Clear to auscultation bilaterally. Breathing is unlabored. GI: Soft, nontender, non-distended, BS +x 4. MS: no deformity. Extremities: No clubbing or cyanosis. No edema. Distal pedal pulses are 2+ and equal bilaterally. Neuro:  AAOx3. Follows commands. Psych:  Responds to questions appropriately with a normal affect.  Labs    High Sensitivity Troponin:   Recent Labs  Lab 08/27/23 1052 08/27/23 1305  TROPONINIHS 121* 136*      Cardiac EnzymesNo results for input(s): "TROPONINI" in the last 168 hours. No results for input(s): "TROPIPOC" in the last 168 hours.   Chemistry Recent Labs  Lab 08/27/23 1052 08/28/23 0405 08/29/23 0402 08/30/23 0429  NA 137 136 136 132*  K 3.7 4.3 4.1 4.4  CL 101 99 97* 97*  CO2 28 31 30 28   GLUCOSE 101* 125* 110* 120*  BUN 15 20 25* 26*  CREATININE 0.88 0.89 0.69 0.84  CALCIUM 9.0 9.1 9.2 8.9  PROT 6.5 6.4*  --   --  ALBUMIN 3.1* 3.0*  --   --   AST 15 16  --   --   ALT 9 10  --   --   ALKPHOS 60 62  --   --   BILITOT 0.9 0.6  --   --   GFRNONAA >60 >60 >60 >60  ANIONGAP 8 6 9 7      Hematology Recent Labs  Lab 08/28/23 0405 08/29/23 0402 08/30/23 0429  WBC 3.5* 5.6 5.8  RBC 3.26* 3.25* 3.43*  HGB 10.5* 10.5* 10.8*  HCT 33.0* 33.3* 36.0  MCV 101.2* 102.5* 105.0*  MCH 32.2 32.3 31.5  MCHC 31.8 31.5 30.0  RDW 14.2 14.5 14.4  PLT 88* 90* 90*    BNP Recent Labs  Lab 08/27/23 1052  BNP 293.9*     DDimer No results for input(s): "DDIMER" in the last 168 hours.   Radiology    ECHO TEE  Result Date: 08/29/2023    TRANSESOPHOGEAL ECHO REPORT   Patient Name:   [name] Date of Exam: 08/29/2023 Medical Rec #:  [id number]            Height:       64.0 in Accession #:    [id number]           Weight:       153.9 lb Date of Birth:  Aug 31, 1946            BSA:          1.750 m Patient  Age:    77 years             BP:           138/70 mmHg Patient Gender: F                    HR:           59 bpm. Exam Location:  Inpatient Procedure: Transesophageal Echo, 3D Echo, Color Doppler and Cardiac Doppler Indications:     I48.91* Unspecified atrial fibrillation  History:         Patient has prior history of Echocardiogram examinations, most                  recent 08/28/2023. Pacemaker. 12/12/13 AVR with 21mm Magna Ease                  bioprosthetic and MVR with 25mm Mitral Magna bioprosthetic.                  Aortic Valve: 21 mm Magna bioprosthetic valve is present in the                  aortic position. Procedure Date: 12/08/13.                  Mitral Valve: 25 mm Edwards MagnaEase bioprosthetic valve valve                  is present in the mitral position. Procedure Date: 12/08/13.  Sonographer:     Irving Burton Senior RDCS Referring Phys:  [id number] BRIAN S CRENSHAW Diagnosing Phys: Olga Millers MD PROCEDURE: After discussion of the risks and benefits of a TEE, an informed consent was obtained from the patient. The transesophogeal probe was passed without difficulty through the esophogus of the patient. Sedation performed by different physician. The patient was monitored while under deep sedation. Anesthestetic sedation was provided intravenously by Anesthesiology: 120mg  of Propofol, 60mg  of Lidocaine. The  patient developed no complications during the procedure. A successful direct current cardioversion was performed at 200 joules with 1 attempt.  IMPRESSIONS  1. Left ventricular ejection fraction, by estimation, is 60 to 65%. The left ventricle has normal function. The left ventricle has no regional wall motion abnormalities. There is severe left ventricular hypertrophy.  2. Right ventricular systolic function is normal. The right ventricular size is mildly enlarged.  3. Left atrial size was moderately dilated. No left atrial/left atrial appendage thrombus was detected.  4. Right atrial size was moderately  dilated.  5. The mitral valve has been repaired/replaced. Trivial mitral valve regurgitation. Mild to moderate mitral stenosis. There is a 25 mm Edwards MagnaEase bioprosthetic valve present in the mitral position. Procedure Date: 12/08/13.  6. Tricuspid valve regurgitation is moderate.  7. The aortic valve has been repaired/replaced. Aortic valve regurgitation is not visualized. There is a 21 mm Magna bioprosthetic valve present in the aortic position. Procedure Date: 12/08/13.  8. Aortic dilatation noted. There is mild dilatation of the ascending aorta, measuring 41 mm. 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  9. Moderately dilated pulmonary artery. 10. Evidence of atrial level shunting detected by color flow Doppler. There is a small patent foramen ovale. FINDINGS  Left Ventricle: Left ventricular ejection fraction, by estimation, is 60 to 65%. The left ventricle has normal function. The left ventricle has no regional wall motion abnormalities. The left ventricular internal cavity size was small. There is severe left ventricular hypertrophy. Right Ventricle: The right ventricular size is mildly enlarged. Right ventricular systolic function is normal. Left Atrium: Left atrial size was moderately dilated. No left atrial/left atrial appendage thrombus was detected. Right Atrium: Right atrial size was moderately dilated. Pericardium: There is no evidence of pericardial effusion. Mitral Valve: The mitral valve has been repaired/replaced. Trivial mitral valve regurgitation. There is a 25 mm Edwards MagnaEase bioprosthetic valve present in the mitral position. Procedure Date: 12/08/13. Mild to moderate mitral valve stenosis. MV peak gradient, 14.3 mmHg. The mean mitral valve gradient is 7.2 mmHg. Tricuspid Valve: The tricuspid valve is normal in structure. Tricuspid valve regurgitation is moderate. Aortic Valve: The aortic valve has been repaired/replaced. Aortic valve regurgitation is not  visualized. Aortic valve mean gradient measures 12.0 mmHg. Aortic valve peak gradient measures 19.0 mmHg. There is a 21 mm Magna bioprosthetic valve present in the aortic position. Procedure Date: 12/08/13. Pulmonic Valve: The pulmonic valve was normal in structure. Pulmonic valve regurgitation is not visualized. Aorta: Aortic dilatation noted. There is mild dilatation of the ascending aorta, measuring 41 mm. There is moderate (Grade III) plaque involving the descending aorta. Pulmonary Artery: The pulmonary artery is moderately dilated. IAS/Shunts: Evidence of atrial level shunting detected by color flow Doppler. A small patent foramen ovale is detected. Additional Comments: Spectral Doppler performed. AORTIC VALVE AV Vmax:           218.00 cm/s AV Vmean:          169.000 cm/s AV VTI:            0.442 m AV Peak Grad:      19.0 mmHg AV Mean Grad:      12.0 mmHg LVOT Vmax:         65.40 cm/s LVOT Vmean:        49.100 cm/s LVOT VTI:          0.116 m LVOT/AV VTI ratio: 0.26 MITRAL VALVE  TRICUSPID VALVE MV Peak grad: 14.3 mmHg  TR Peak grad:   43.6 mmHg MV Mean grad: 7.2 mmHg   TR Vmax:        330.00 cm/s MV Vmax:      1.89 m/s MV Vmean:     124.0 cm/s SHUNTS                          Systemic VTI: 0.12 m Olga Millers MD Electronically signed by Olga Millers MD Signature Date/Time: 08/29/2023/12:16:56 PM    Final    EP STUDY  Result Date: 08/29/2023 See surgical note for result.   Cardiac Studies    2d echo 08/28/23   1. Left ventricular ejection fraction, by estimation, is >75%. The left  ventricle has hyperdynamic function. The left ventricle has no regional  wall motion abnormalities. There is moderate left ventricular hypertrophy.  Left ventricular diastolic  function could not be evaluated.   2. Right ventricular systolic function reduced. The right ventricular  size is mildly enlarged. There is severely elevated pulmonary artery  systolic pressure. The estimated right ventricular  systolic pressure is  64.0 mmHg.   3. Left atrial size was mildly dilated.   4. Right atrial size was mildly dilated.   5. 25 mm [facility] Ease bioprosthetic valve in the mitral position  (12/08/13), well-seated, no regurgitation, findings to suggest mitral valve  stenosis (peak velocity 2.48m/s, MG , TVI ratio 3.5, EOA0.80cm2, HR  75bpm) but higher gradients can also   be seen with increased flow through the mitral valve, increased HR,  prosthesis-patient mismatch. Subvalvular calcification present. Clinical  correlation required.   6. Tricuspid valve regurgitation is moderate.   7. 21 mm magna bioprosthetic valve in the aortic position (12/08/13),  well-seated, no valvular stenosis or regurgitation (peak velocity 2.10m/s,  MG , AVA 1.4cm2, AT<100 msec, DI 0.51).   8. Aortic dilatation noted. There is dilatation of the ascending aorta,  measuring 40 mm.   9. The inferior vena cava is dilated in size with <50% respiratory  variability, suggesting right atrial pressure of 15 mmHg.   TEE 08/29/23     1. Left ventricular ejection fraction, by estimation, is 60 to 65%. The  left ventricle has normal function. The left ventricle has no regional  wall motion abnormalities. There is severe left ventricular hypertrophy.   2. Right ventricular systolic function is normal. The right ventricular  size is mildly enlarged.   3. Left atrial size was moderately dilated. No left atrial/left atrial  appendage thrombus was detected.   4. Right atrial size was moderately dilated.   5. The mitral valve has been repaired/replaced. Trivial mitral valve  regurgitation. Mild to moderate mitral stenosis. There is a 25 mm Edwards  MagnaEase bioprosthetic valve present in the mitral position. Procedure  Date: 12/08/13.   6. Tricuspid valve regurgitation is moderate.   7. The aortic valve has been repaired/replaced. Aortic valve  regurgitation is not visualized. There is a 21 mm Magna bioprosthetic   valve present in the aortic position. Procedure Date: 12/08/13.   8. Aortic dilatation noted. There is mild dilatation of the ascending  aorta, measuring 41 mm. There is Moderate (Grade III) plaque involving the  descending aorta.   9. Moderately dilated pulmonary artery.  10. Evidence of atrial level shunting detected by color flow Doppler.  There is a small patent foramen ovale.     Patient Profile     77 y.o. 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 and MV replacement in 2015, MV bioprosthetic stenosis (TEE without clear thrombus but suspected, prompting anticoagulation transition from Xarelto to warfarin with resolution, s/p transition to Eliquis 08/2021 due to erratic INR/easy bleeding & bruising), prior transient amaurosis fugax during period of subtherapeutic INR, small PFO, aortic atherosclerosis, minimal CAD by cath 2015 (30% distal LCx otherwise minimal luminal irregularities), COPD, hyperlipidemia, 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 She presented with worsening SOB felt to have a/c CHF. Interrogation had shown persistent afib over the last 10 days, aflutter (as could be seen on tele), and 21% burden since last interrogation. Patient had briefly been off OAC for bone marrow biopsy earlier this month, was also pending OP referral to GI for occult stool card positive without overt signs of GI bleeding. Underwent TEE/DCCV 08/29/23 to NSR.  Assessment & Plan     1. Persistent atrial fib/flutter and V  pacing (h/o PPM) - per sign out from colleague , interrogation had shown persistent afib over the last 10 days, aflutter (as could be seen on tele), and 21% burden since last interrogation - patient underwent TEE/DCCV to NSR yesterday - continue Eliquis - had prior syncope with bisoprolol, not presently on AVN blocking agents - most notes outline treatment with metoprolol, but was last on med list in 2019 -- Dr. Duke Salvia indicates patient will need antiarrhythmic, will review plan - scheduled EP f/u for 12/2    2. Anemia/thrombocytopenia - recent BMB with "Variably cellular bone marrow with trilineage hematopoiesis" without clear malignancy noted; patient pending OP GI eval for occult blood - won't be able to interrupt OAC for 4 weeks if clearance requested - Hgb stable today   3. 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 - 2D echo this admission with EF >75%, moderate LVH, severely elevated pulmoanry pressure, mildly enlarged RV, mild BAE, 25 mm [facility] Ease bioprosthetic valve in the mitral position (12/08/13), well-seated, no regurgitation, findings to suggest mitral valve stenosis (peak velocity 2.71m/s, MG , TVI ratio 3.5, EOA0.80cm2, HR 75bpm) but higher gradients can also  be seen with increased flow through the mitral valve, increased HR, prosthesis-patient mismatch, moderate TR, no AS/AI, dilated IVC, dilation of ascending aorta 40mm - TEE as above - s/p IV Lasix, transitioned to oral Lasix today per MD. Weight slightly uptrending but clinically has felt better - prior note outlines starting Jardiance, will confirm plan with MD. Pt reports h/o UTIs ever since she had stim device for her urinary incontinence and takes abx for suppression - ?further w/o for severe pHTN - previously moderate, may be driven in part due to her fluid retention on admission superimposed on baseline COPD, anticipate need to continue to f/u pulm as well - scheduled gen cards f/u 11/26  with APP   4.  Rheumatic valvular disease with severe AS/MS s/p bioprosthetic AV replacement and MV replacement in 2015, MV bioprosthetic stenosis 2020 (TEE at that time without clear thrombus but suspected, prompting anticoagulation transition from Xarelto to warfarin with resolution, s/p transition to Eliquis 08/2021 due to erratic INR/easy bleeding & bruising - TEE  findings as above - continue to monitor   5. Elevated troponin - suspected demand ischemia in context of above - minimal CAD by cath 2015 - continue pravastatin therapy, monitor as outpatient  For questions or updates, please contact  [facility] Please consult [email or web address] for contact info under Cardiology/STEMI.  Signed, Laurann Montana, PA-C 08/30/2023, 10:16 AM

## 2023-08-30 NOTE — Discharge Summary (Signed)
Physician Discharge Summary   Patient: [name] MRN: [id number] DOB: March 27, 1946  Admit date:     08/27/2023  Discharge date: 08/30/2023  Discharge Physician: Jacquelin Hawking, MD   PCP: [name], MD   Recommendations at discharge:  PCP visit for hospital follow-up Cardiology/EP visit for atrial fibrillation/flutter  Discharge Diagnoses: Principal Problem:   Acute heart failure with preserved ejection fraction (HFpEF) (HCC) Active Problems:   HLD (hyperlipidemia)   Essential hypertension, benign   COPD (chronic obstructive pulmonary disease)   GERD (gastroesophageal reflux disease)   Mitral valve replaced   Vitamin B12 deficiency   Atrial flutter (HCC)   Thrombocytopenia   Persistent atrial fibrillation   Peripheral neuropathy   Acute exacerbation of chronic obstructive pulmonary disease (COPD) (HCC)  Resolved Problems:   * No resolved hospital problems. *  Hospital Course: [name] is a 77 y.o. female with a history of second-degree AV block, anxiety, depression, atrial fibrillation, PUD, aortic arteriosclerosis, ascending aortic aneurysm, diastolic heart failure.  Patient presented secondary to shortness of breath and found to have evidence of acute diastolic heart failure in addition to COPD exacerbation. Heart failure complicated by high burden atrial fibrillation/flutter. Cardiology consulted. Patient diuresed and given steroids/breathing treatments with improvement of symptoms. Patient underwent a Transesophageal Echocardiogram with cardioversion per cardiology recommendations prior to discharge.  Assessment and Plan:  Acute on chronic diastolic heart failure Patient with shortness of breath on admission with chest x-ray evidence of cardiomegaly and mild interstitial edema, in addition to a BNP of 293.9. Complicated by persistent atrial flutter/fibrillation. Cardiology consulted. Patient started on Lasix IV diuresis with improvement in symptoms.  Transthoracic Echocardiogram significant for LVEF > 75%. Patient with improvement of functional capacity and eventual transitioned to Lasix PO on discharge.  COPD exacerbation Patient managed with Solumedrol and duonebs with resolution of symptoms. Unclear if patient had combination COPD exacerbation and heart failure vs isolated heart failure  Pleural effusions Noted on imaging. Managed with Lasix IV. No dyspnea or hypoxia prior to discharge.  Persistent atrial fibrillation/flutter Cardiology consulted and performed successful cardioversion on 11/19. Recommendation to follow-up with EP for antiarrhythmic medication. Continue Eliquis on discharge.  Peripheral neuropathy Continue gabapentin.  Hyperlipidemia Continue lovastatin.  Primary hypertension Continue lisinopril.  Vitamin B12 deficiency Continue vitamin B12.  Thrombocytopenia Chronic and stable.   Consultants:  Cardiology  Procedures performed:  11/18: Transthoracic Echocardiogram 11/19: Transesophageal Echocardiogram with cardioversion   Disposition: Home Diet recommendation: Cardiac diet   DISCHARGE MEDICATION: Allergies as of 08/30/2023       Reactions   Tape Other (See Comments)   "THICK TAPES PULLS OFF MY SKIN"   Codeine Nausea Only   Prednisone Nausea Only   Sulfa Antibiotics Nausea Only        Medication List     STOP taking these medications    amoxicillin 500 MG tablet Commonly known as: AMOXIL       TAKE these medications    acetaminophen 500 MG tablet Commonly known as: TYLENOL Take 2 tablets (1,000 mg total) by mouth daily. What changed: when to take this   acidophilus Caps capsule Take 1 capsule by mouth daily.   albuterol 108 (90 Base) MCG/ACT inhaler Commonly known as: VENTOLIN HFA Inhale 2 puffs into the lungs every 6 (six) hours as needed for wheezing.   alendronate 70 MG tablet Commonly known as: FOSAMAX Take 1 tablet (70 mg total) by mouth every 7 (seven) days.  Take with a full glass of water on an  empty stomach.   ascorbic acid 500 MG tablet Commonly known as: VITAMIN C Take 1 tablet (500 mg total) by mouth daily.   AZO URINARY TRACT SUPPORT PO Take 1 capsule by mouth daily.   Biotin 1000 MCG tablet Take 1,000 mcg by mouth daily.   buPROPion 150 MG 12 hr tablet Commonly known as: WELLBUTRIN SR TAKE 1 TABLET(150 MG) BY MOUTH EVERY MORNING What changed: See the new instructions.   calcium carbonate 1500 (600 Ca) MG Tabs tablet Commonly known as: OSCAL Take 600 mg of elemental calcium by mouth daily.   carboxymethylcellulose 0.5 % Soln Commonly known as: REFRESH PLUS Place 1 drop into both eyes 3 (three) times daily as needed (dry eyes).   cephALEXin 250 MG capsule Commonly known as: KEFLEX Take 250 mg by mouth daily with breakfast.   cholecalciferol 25 MCG (1000 UNIT) tablet Commonly known as: VITAMIN D3 Take 1,000 Units by mouth daily.   docusate sodium 100 MG capsule Commonly known as: COLACE Take 200 mg by mouth daily.   Eliquis 5 MG Tabs tablet Generic drug: apixaban TAKE 1 TABLET(5 MG) BY MOUTH TWICE DAILY What changed: See the new instructions.   furosemide 40 MG tablet Commonly known as: LASIX TAKE 1 TABLET BY MOUTH EVERY DAY. MAY TAKE AN EXTRA TABLET AS NEEDED FOR SWELLING What changed: See the new instructions.   gabapentin 300 MG capsule Commonly known as: NEURONTIN Take 300 mg by mouth at bedtime. What changed: Another medication with the same name was changed. Make sure you understand how and when to take each.   gabapentin 100 MG capsule Commonly known as: NEURONTIN Take 1 capsule (100 mg total) by mouth 2 (two) times daily as needed (back pain - for daytime use). What changed: when to take this   Gemtesa 75 MG Tabs Generic drug: Vibegron Take 75 mg by mouth daily.   hydrocortisone 2.5 % rectal cream Commonly known as: Anusol-HC Place 1 Application rectally 2 (two) times daily. What changed:  when  to take this reasons to take this   ipratropium-albuterol 0.5-2.5 (3) MG/3ML Soln Commonly known as: DuoNeb Take 3 mLs by nebulization every 6 (six) hours as needed. What changed: reasons to take this   lisinopril 2.5 MG tablet Commonly known as: ZESTRIL Take 1 tablet (2.5 mg total) by mouth daily.   lovastatin 40 MG tablet Commonly known as: MEVACOR TAKE 1 TABLET(40 MG) BY MOUTH AT BEDTIME What changed: See the new instructions.   montelukast 10 MG tablet Commonly known as: SINGULAIR TAKE 1 TABLET(10 MG) BY MOUTH AT BEDTIME What changed: See the new instructions.   ondansetron 4 MG disintegrating tablet Commonly known as: ZOFRAN-ODT Take 1 tablet (4 mg total) by mouth every 8 (eight) hours as needed for nausea or vomiting. What changed: reasons to take this   polyethylene glycol 17 g packet Commonly known as: MIRALAX / GLYCOLAX Take 17 g by mouth daily. What changed:  when to take this reasons to take this   potassium chloride 10 MEQ tablet Commonly known as: KLOR-CON TAKE 2 TABLETS(20 MEQ) BY MOUTH DAILY What changed: See the new instructions.   PRESERVISION AREDS 2 PO Take 2 capsules by mouth in the morning.   psyllium 0.52 g capsule Commonly known as: REGULOID Take 0.52 g by mouth in the morning and at bedtime.   traMADol 50 MG tablet Commonly known as: ULTRAM Take 1 tablet (50 mg total) by mouth 2 (two) times daily as needed for moderate pain (pain score  4-6).   traZODone 50 MG tablet Commonly known as: DESYREL TAKE 1/2 TO 1 TABLET(25 TO 50 MG) BY MOUTH AT BEDTIME What changed: See the new instructions.   Trelegy Ellipta 200-62.5-25 MCG/ACT Aepb Generic drug: Fluticasone-Umeclidin-Vilant Inhale 1 puff into the lungs daily.   vitamin B-12 500 MCG tablet Commonly known as: CYANOCOBALAMIN Take 1 tablet (500 mcg total) by mouth 2 (two) times a week.   Zinc 50 MG Caps Take 1 capsule (50 mg total) by mouth daily.        Follow-up Information      Jeanella Craze, NP Follow up.   Specialty: Cardiology Why: [facility] - [address] location - electrophysiology follow-up on Monday Sep 11, 2023 at 10:15 AM (Arrive by 10:00 AM). Merry Proud is one of our nurse practitioners with Dr. Elberta Fortis. Contact information: [address] [place] [address] [phone]         Jodelle Gross, NP Follow up.   Specialties: Cardiology, Radiology, Cardiology Why: [facility] - [facility] location - general cardiology follow-up on Tuesday Sep 05, 2023 at 8:50 AM. Arrive 15 minutes prior to appointment to check in. Samara Deist is one of our nurse practitioners that works with Dr. Royann Shivers. Contact information: [address] [place] [address] [phone]                Discharge Exam: BP (!) 140/71 (BP Location: Left Arm)   Pulse 73   Temp 97.9 F (36.6 C) (Oral)   Resp 18   Ht 5\' 4"  (1.626 m)   Wt 70.1 kg   SpO2 93%   BMI 26.53 kg/m   General exam: Appears calm and comfortable Respiratory system: Respiratory effort normal. Central nervous system: Alert and oriented. Psychiatry: Judgement and insight appear normal. Mood & affect appropriate.   Condition at discharge: stable  The results of significant diagnostics from this hospitalization (including imaging, microbiology, ancillary and laboratory) are listed below for reference.   Imaging Studies: ECHO TEE  Result Date: 08/29/2023    TRANSESOPHOGEAL ECHO REPORT   Patient Name:   ITALIA BROWNBACK Date of Exam: 08/29/2023 Medical Rec #:  [id number]            Height:       64.0 in Accession #:    [id number]           Weight:       153.9 lb Date of Birth:  10/23/1945            BSA:          1.750 m Patient Age:    77 years             BP:           138/70 mmHg Patient Gender: F                    HR:           59 bpm. Exam Location:  Inpatient Procedure: Transesophageal Echo, 3D Echo, Color Doppler and Cardiac Doppler Indications:     I48.91*  Unspecified atrial fibrillation  History:         Patient has prior history of Echocardiogram examinations, most                  recent 08/28/2023. Pacemaker. 12/12/13 AVR with 21mm Magna Ease                  bioprosthetic and  MVR with 25mm Mitral Magna bioprosthetic.                  Aortic Valve: 21 mm Magna bioprosthetic valve is present in the                  aortic position. Procedure Date: 12/08/13.                  Mitral Valve: 25 mm Edwards MagnaEase bioprosthetic valve valve                  is present in the mitral position. Procedure Date: 12/08/13.  Sonographer:     Irving Burton Senior RDCS Referring Phys:  [id number] BRIAN S CRENSHAW Diagnosing Phys: Olga Millers MD PROCEDURE: After discussion of the risks and benefits of a TEE, an informed consent was obtained from the patient. The transesophogeal probe was passed without difficulty through the esophogus of the patient. Sedation performed by different physician. The patient was monitored while under deep sedation. Anesthestetic sedation was provided intravenously by Anesthesiology: 120mg  of Propofol, 60mg  of Lidocaine. The patient developed no complications during the procedure. A successful direct current cardioversion was performed at 200 joules with 1 attempt.  IMPRESSIONS  1. Left ventricular ejection fraction, by estimation, is 60 to 65%. The left ventricle has normal function. The left ventricle has no regional wall motion abnormalities. There is severe left ventricular hypertrophy.  2. Right ventricular systolic function is normal. The right ventricular size is mildly enlarged.  3. Left atrial size was moderately dilated. No left atrial/left atrial appendage thrombus was detected.  4. Right atrial size was moderately dilated.  5. The mitral valve has been repaired/replaced. Trivial mitral valve regurgitation. Mild to moderate mitral stenosis. There is a 25 mm Edwards MagnaEase bioprosthetic valve present in the mitral position. Procedure Date: 12/08/13.  6.  Tricuspid valve regurgitation is moderate.  7. The aortic valve has been repaired/replaced. Aortic valve regurgitation is not visualized. There is a 21 mm Magna bioprosthetic valve present in the aortic position. Procedure Date: 12/08/13.  8. Aortic dilatation noted. There is mild dilatation of the ascending aorta, measuring 41 mm. 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  9. Moderately dilated pulmonary artery. 10. Evidence of atrial level shunting detected by color flow Doppler. There is a small patent foramen ovale. FINDINGS  Left Ventricle: Left ventricular ejection fraction, by estimation, is 60 to 65%. The left ventricle has normal function. The left ventricle has no regional wall motion abnormalities. The left ventricular internal cavity size was small. There is severe left ventricular hypertrophy. Right Ventricle: The right ventricular size is mildly enlarged. Right ventricular systolic function is normal. Left Atrium: Left atrial size was moderately dilated. No left atrial/left atrial appendage thrombus was detected. Right Atrium: Right atrial size was moderately dilated. Pericardium: There is no evidence of pericardial effusion. Mitral Valve: The mitral valve has been repaired/replaced. Trivial mitral valve regurgitation. There is a 25 mm Edwards MagnaEase bioprosthetic valve present in the mitral position. Procedure Date: 12/08/13. Mild to moderate mitral valve stenosis. MV peak gradient, 14.3 mmHg. The mean mitral valve gradient is 7.2 mmHg. Tricuspid Valve: The tricuspid valve is normal in structure. Tricuspid valve regurgitation is moderate. Aortic Valve: The aortic valve has been repaired/replaced. Aortic valve regurgitation is not visualized. Aortic valve mean gradient measures 12.0 mmHg. Aortic valve peak gradient measures 19.0 mmHg. There is a 21 mm Magna bioprosthetic valve present in the aortic position. Procedure Date: 12/08/13.  Pulmonic Valve: The pulmonic valve was normal  in structure. Pulmonic valve regurgitation is not visualized. Aorta: Aortic dilatation noted. There is mild dilatation of the ascending aorta, measuring 41 mm. There is moderate (Grade III) plaque involving the descending aorta. Pulmonary Artery: The pulmonary artery is moderately dilated. IAS/Shunts: Evidence of atrial level shunting detected by color flow Doppler. A small patent foramen ovale is detected. Additional Comments: Spectral Doppler performed. AORTIC VALVE AV Vmax:           218.00 cm/s AV Vmean:          169.000 cm/s AV VTI:            0.442 m AV Peak Grad:      19.0 mmHg AV Mean Grad:      12.0 mmHg LVOT Vmax:         65.40 cm/s LVOT Vmean:        49.100 cm/s LVOT VTI:          0.116 m LVOT/AV VTI ratio: 0.26 MITRAL VALVE             TRICUSPID VALVE MV Peak grad: 14.3 mmHg  TR Peak grad:   43.6 mmHg MV Mean grad: 7.2 mmHg   TR Vmax:        330.00 cm/s MV Vmax:      1.89 m/s MV Vmean:     124.0 cm/s SHUNTS                          Systemic VTI: 0.12 m Olga Millers MD Electronically signed by Olga Millers MD Signature Date/Time: 08/29/2023/12:16:56 PM    Final    EP STUDY  Result Date: 08/29/2023 See surgical note for result.  ECHOCARDIOGRAM COMPLETE  Result Date: 08/28/2023    ECHOCARDIOGRAM REPORT   Patient Name:   JOBETH FURTAW Date of Exam: 08/28/2023 Medical Rec #:  [id number]            Height:       64.0 in Accession #:    [id number]           Weight:       149.0 lb Date of Birth:  1946-07-01            BSA:          1.726 m Patient Age:    77 years             BP:           152/83 mmHg Patient Gender: F                    HR:           63 bpm. Exam Location:  Inpatient Procedure: 2D Echo, Cardiac Doppler and Color Doppler Indications:    CHF-Acute Diastolic I50.31  History:        Patient has prior history of Echocardiogram examinations, most                 recent 10/06/2022. COPD, Mitral Valve Disease and Aortic Valve                 Disease, Arrythmias:Atrial Flutter;  Signs/Symptoms:Murmur.  Sonographer:    Darlys Gales Referring Phys: [id number] DAVID MANUEL ORTIZ IMPRESSIONS  1. Left ventricular ejection fraction, by estimation, is >75%. The left ventricle has hyperdynamic function. The left ventricle has no regional wall motion abnormalities. There is moderate left ventricular hypertrophy. Left ventricular  diastolic function could not be evaluated.  2. Right ventricular systolic function reduced. The right ventricular size is mildly enlarged. There is severely elevated pulmonary artery systolic pressure. The estimated right ventricular systolic pressure is 64.0 mmHg.  3. Left atrial size was mildly dilated.  4. Right atrial size was mildly dilated.  5. 25 mm [facility] Ease bioprosthetic valve in the mitral position (12/08/13), well-seated, no regurgitation, findings to suggest mitral valve stenosis (peak velocity 2.50m/s, MG , TVI ratio 3.5, EOA0.80cm2, HR 75bpm) but higher gradients can also  be seen with increased flow through the mitral valve, increased HR, prosthesis-patient mismatch. Subvalvular calcification present. Clinical correlation required.  6. Tricuspid valve regurgitation is moderate.  7. 21 mm magna bioprosthetic valve in the aortic position (12/08/13), well-seated, no valvular stenosis or regurgitation (peak velocity 2.38m/s, MG , AVA 1.4cm2, AT<100 msec, DI 0.51).  8. Aortic dilatation noted. There is dilatation of the ascending aorta, measuring 40 mm.  9. The inferior vena cava is dilated in size with <50% respiratory variability, suggesting right atrial pressure of 15 mmHg. Comparison(s): A prior study was performed on 10/06/2022. Prior transmitral valve gradient (mean 10 mmHg) and transaortic gradient (mean ). FINDINGS  Left Ventricle: Left ventricular ejection fraction, by estimation, is >75%. The left ventricle has hyperdynamic function. The left ventricle has no regional wall motion abnormalities. The left ventricular internal cavity size  was normal in size. There is moderate left ventricular hypertrophy. Left ventricular diastolic function could not be evaluated due to mitral valve replacement. Left ventricular diastolic function could not be evaluated. Right Ventricle: The right ventricular size is mildly enlarged. Right vetricular wall thickness was not well visualized. Right ventricular systolic function reduced. There is severely elevated pulmonary artery systolic pressure. The tricuspid regurgitant  velocity is 3.50 m/s, and with an assumed right atrial pressure of 15 mmHg, the estimated right ventricular systolic pressure is 64.0 mmHg. Left Atrium: Left atrial size was mildly dilated. Right Atrium: Right atrial size was mildly dilated. Pericardium: There is no evidence of pericardial effusion. Mitral Valve: 25 mm [facility] Ease bioprosthetic valve in the mitral position (12/08/13), well-seated, no regurgitation, findings to suggest mitral valve stenosis (peak velocity 2.85m/s, MG , TVI ratio 3.5, EOA0.80cm2, HR 75bpm) but higher gradients can also be seen with increased flow through the mitral valve, increased HR, prosthesis-patient mismatch. Subvalvular calcification present. Clinical correlation required. MV peak gradient, 25.6 mmHg. The mean mitral valve gradient is 11.0 mmHg. Tricuspid Valve: The tricuspid valve is normal in structure. Tricuspid valve regurgitation is moderate . No evidence of tricuspid stenosis. Aortic Valve: 21 mm magna bioprosthetic valve in the aortic position (12/08/13), well-seated, no valvular stenosis or regurgitation (peak velocity 2.94m/s, MG , AVA 1.4cm2, AT<100 msec, DI 0.51). Aortic valve mean gradient measures 13.0 mmHg. Aortic valve peak gradient measures 20.8 mmHg. Aortic valve area, by VTI measures 1.44 cm. Pulmonic Valve: The pulmonic valve was not well visualized. Pulmonic valve regurgitation is mild. No evidence of pulmonic stenosis. Aorta: Aortic dilatation noted. There is dilatation of the  ascending aorta, measuring 40 mm. Venous: The inferior vena cava is dilated in size with less than 50% respiratory variability, suggesting right atrial pressure of 15 mmHg. IAS/Shunts: The interatrial septum was not well visualized. Additional Comments: A device lead is visualized.  LEFT VENTRICLE PLAX 2D LVIDd:         3.50 cm   Diastology LVIDs:         1.60 cm   LV e' medial:  3.70 cm/s LV PW:         1.40 cm   LV E/e' medial: 64.3 LV IVS:        1.40 cm LVOT diam:     1.90 cm LV SV:         59 LV SV Index:   34 LVOT Area:     2.84 cm  RIGHT VENTRICLE RV Basal diam:  4.50 cm RV Mid diam:    3.90 cm RV S prime:     7.83 cm/s TAPSE (M-mode): 1.4 cm LEFT ATRIUM             Index        RIGHT ATRIUM           Index LA Vol (A2C):   49.1 ml 28.44 ml/m  RA Area:     16.60 cm LA Vol (A4C):   74.3 ml 43.04 ml/m  RA Volume:   45.50 ml  26.35 ml/m LA Biplane Vol: 64.6 ml 37.42 ml/m  AORTIC VALVE AV Area (Vmax):    1.52 cm AV Area (Vmean):   1.32 cm AV Area (VTI):     1.44 cm AV Vmax:           228.00 cm/s AV Vmean:          170.000 cm/s AV VTI:            0.412 m AV Peak Grad:      20.8 mmHg AV Mean Grad:      13.0 mmHg LVOT Vmax:         122.00 cm/s LVOT Vmean:        79.400 cm/s LVOT VTI:          0.209 m LVOT/AV VTI ratio: 0.51  AORTA Ao Asc diam: 4.00 cm MITRAL VALVE                TRICUSPID VALVE MV Area (PHT): 2.55 cm     TR Peak grad:   49.0 mmHg MV Area VTI:   0.80 cm     TR Vmax:        350.00 cm/s MV Peak grad:  25.6 mmHg MV Mean grad:  11.0 mmHg    SHUNTS MV Vmax:       2.53 m/s     Systemic VTI:  0.21 m MV Vmean:      153.0 cm/s   Systemic Diam: 1.90 cm MV VTI:        0.74 m MV Decel Time: 298 msec MV E velocity: 238.00 cm/s MV A velocity: 152.00 cm/s MV E/A ratio:  1.57 Sunit Tolia Electronically signed by Tessa Lerner Signature Date/Time: 08/28/2023/4:15:29 PM    Final    DG Chest Port 1 View  Result Date: 08/27/2023 CLINICAL DATA:  Shortness of breath. EXAM: PORTABLE CHEST 1 VIEW COMPARISON:   Two-view chest x-ray 10/09/2022 FINDINGS: The heart is enlarged. Atherosclerotic calcifications are present at the aortic arch. Left greater than right pleural effusions have increased. Mild interstitial edema is present. Bibasilar airspace opacities are noted, left greater than right. IMPRESSION: 1. Cardiomegaly and mild interstitial edema compatible with congestive heart failure. 2. Left greater than right pleural effusions. 3. Bibasilar airspace disease likely reflects atelectasis. Electronically Signed   By: Marin Roberts [name]   On: 08/27/2023 11:14   CT BONE MARROW BIOPSY & ASPIRATION  Result Date: 08/23/2023 INDICATION: Anemia and thrombocytopenia of uncertain etiology. Please perform CT-guided bone marrow biopsy for tissue diagnostic purposes. EXAM: CT-GUIDED BONE MARROW  BIOPSY AND ASPIRATION MEDICATIONS: None ANESTHESIA/SEDATION: Moderate (conscious) sedation was employed during this procedure as administered by the Interventional Radiology RN. A total of Versed 2 mg and Fentanyl 100 mcg was administered intravenously. Moderate Sedation Time: 10 minutes. The patient's level of consciousness and vital signs were monitored continuously by radiology nursing throughout the procedure under my direct supervision. COMPLICATIONS: None immediate. PROCEDURE: Informed consent was obtained from the patient following an explanation of the procedure, risks, benefits and alternatives. The patient understands, agrees and consents for the procedure. All questions were addressed. A time out was performed prior to the initiation of the procedure. The patient was positioned left lateral decubitus and non-contrast localization CT was performed of the pelvis to demonstrate the iliac marrow spaces. The operative site was prepped and draped in the usual sterile fashion. Under sterile conditions and local anesthesia, a 22 gauge spinal needle was utilized for procedural planning. Next, an 11 gauge coaxial bone biopsy  needle was advanced into the right iliac marrow space. Needle position was confirmed with CT imaging. Initially, a bone marrow aspiration was performed. Next, a bone marrow biopsy was obtained with the 11 gauge outer bone marrow device. The needle was removed and superficial hemostasis was obtained with manual compression. A dressing was applied. The patient tolerated the procedure well without immediate post procedural complication. IMPRESSION: Successful CT guided right iliac bone marrow aspiration and core biopsy. Electronically Signed   By: Simonne Come [name]   On: 08/23/2023 11:52    Microbiology: Results for orders placed or performed during the hospital encounter of 08/27/23  Resp panel by RT-PCR (RSV, Flu A&B, Covid) Anterior Nasal Swab     Status: None   Collection Time: 08/27/23 10:53 AM   Specimen: Anterior Nasal Swab  Result Value Ref Range Status   SARS Coronavirus 2 by RT PCR NEGATIVE NEGATIVE Final    Comment: (NOTE) SARS-CoV-2 target nucleic acids are NOT DETECTED.  The SARS-CoV-2 RNA is generally detectable in upper respiratory specimens during the acute phase of infection. The lowest concentration of SARS-CoV-2 viral copies this assay can detect is 138 copies/mL. A negative result does not preclude SARS-Cov-2 infection and should not be used as the sole basis for treatment or other patient management decisions. A negative result may occur with  improper specimen collection/handling, submission of specimen other than nasopharyngeal swab, presence of viral mutation(s) within the areas targeted by this assay, and inadequate number of viral copies(<138 copies/mL). A negative result must be combined with clinical observations, patient history, and epidemiological information. The expected result is Negative.  Fact Sheet for Patients:  BloggerCourse.com  Fact Sheet for Healthcare Providers:  SeriousBroker.it  This test is no t  yet approved or cleared by the [place] FDA and  has been authorized for detection and/or diagnosis of SARS-CoV-2 by FDA under an Emergency Use Authorization (EUA). This EUA will remain  in effect (meaning this test can be used) for the duration of the COVID-19 declaration under Section 564(b)(1) of the Act, 21 U.S.C.section 360bbb-3(b)(1), unless the authorization is terminated  or revoked sooner.       Influenza A by PCR NEGATIVE NEGATIVE Final   Influenza B by PCR NEGATIVE NEGATIVE Final    Comment: (NOTE) The Xpert Xpress SARS-CoV-2/FLU/RSV plus assay is intended as an aid in the diagnosis of influenza from Nasopharyngeal swab specimens and should not be used as a sole basis for treatment. Nasal washings and aspirates are unacceptable for Xpert Xpress SARS-CoV-2/FLU/RSV testing.  Fact Sheet  for Patients: BloggerCourse.com  Fact Sheet for Healthcare Providers: SeriousBroker.it  This test is not yet approved or cleared by the [place] FDA and has been authorized for detection and/or diagnosis of SARS-CoV-2 by FDA under an Emergency Use Authorization (EUA). This EUA will remain in effect (meaning this test can be used) for the duration of the COVID-19 declaration under Section 564(b)(1) of the Act, 21 U.S.C. section 360bbb-3(b)(1), unless the authorization is terminated or revoked.     Resp Syncytial Virus by PCR NEGATIVE NEGATIVE Final    Comment: (NOTE) Fact Sheet for Patients: BloggerCourse.com  Fact Sheet for Healthcare Providers: SeriousBroker.it  This test is not yet approved or cleared by the [place] FDA and has been authorized for detection and/or diagnosis of SARS-CoV-2 by FDA under an Emergency Use Authorization (EUA). This EUA will remain in effect (meaning this test can be used) for the duration of the COVID-19 declaration under Section 564(b)(1)  of the Act, 21 U.S.C. section 360bbb-3(b)(1), unless the authorization is terminated or revoked.  Performed at [facility], [address], [place], [place] [address]    *Note: Due to a large number of results and/or encounters for the requested time period, some results have not been displayed. A complete set of results can be found in Results Review.    Labs: CBC: Recent Labs  Lab 08/27/23 1052 08/28/23 0405 08/29/23 0402 08/30/23 0429  WBC 5.9 3.5* 5.6 5.8  NEUTROABS 4.8  --   --   --   HGB 10.5* 10.5* 10.5* 10.8*  HCT 32.5* 33.0* 33.3* 36.0  MCV 101.9* 101.2* 102.5* 105.0*  PLT 100* 88* 90* 90*   Basic Metabolic Panel: Recent Labs  Lab 08/27/23 1052 08/28/23 0405 08/29/23 0402 08/30/23 0429  NA 137 136 136 132*  K 3.7 4.3 4.1 4.4  CL 101 99 97* 97*  CO2 28 31 30 28   GLUCOSE 101* 125* 110* 120*  BUN 15 20 25* 26*  CREATININE 0.88 0.89 0.69 0.84  CALCIUM 9.0 9.1 9.2 8.9  MG  --   --  2.2 2.2   Liver Function Tests: Recent Labs  Lab 08/27/23 1052 08/28/23 0405  AST 15 16  ALT 9 10  ALKPHOS 60 62  BILITOT 0.9 0.6  PROT 6.5 6.4*  ALBUMIN 3.1* 3.0*    Discharge time spent: 35 minutes.  Signed: Jacquelin Hawking, MD [facility] 08/30/2023

## 2023-08-30 NOTE — Plan of Care (Signed)
  Problem: Education: Goal: Knowledge of General Education information will improve Description: Including pain rating scale, medication(s)/side effects and non-pharmacologic comfort measures Outcome: Progressing   Problem: Activity: Goal: Risk for activity intolerance will decrease Outcome: Progressing   Problem: Nutrition: Goal: Adequate nutrition will be maintained Outcome: Progressing   Problem: Coping: Goal: Level of anxiety will decrease Outcome: Progressing   Problem: Elimination: Goal: Will not experience complications related to urinary retention Outcome: Progressing   Problem: Pain Management: Goal: General experience of comfort will improve Outcome: Progressing   Problem: Safety: Goal: Ability to remain free from injury will improve Outcome: Progressing   Problem: Skin Integrity: Goal: Risk for impaired skin integrity will decrease Outcome: Progressing   Problem: Activity: Goal: Capacity to carry out activities will improve Outcome: Progressing   Problem: Activity: Goal: Ability to tolerate increased activity will improve Outcome: Progressing   Problem: Respiratory: Goal: Ability to maintain a clear airway will improve Outcome: Progressing

## 2023-08-30 NOTE — Plan of Care (Signed)
  Problem: Education: Goal: Knowledge of General Education information will improve Description: Including pain rating scale, medication(s)/side effects and non-pharmacologic comfort measures Outcome: Progressing   Problem: Health Behavior/Discharge Planning: Goal: Ability to manage health-related needs will improve Outcome: Progressing   Problem: Clinical Measurements: Goal: Diagnostic test results will improve Outcome: Progressing Goal: Respiratory complications will improve Outcome: Progressing Goal: Cardiovascular complication will be avoided Outcome: Progressing   

## 2023-08-30 NOTE — Discharge Instructions (Signed)
[name],  You were in the hospital with acute heart failure (excess fluid), a COPD exacerbation and atrial fibrillation/flutter requiring cardioversion. You have improved with treatment. Please continue to take medications as prescribed and follow-up with your PCP and cardiologist.

## 2023-08-30 NOTE — Evaluation (Addendum)
Physical Therapy Evaluation Patient Details Name: Travina Lakey MRN: [id number] DOB: 02/10/1946 Today's Date: 08/30/2023  History of Present Illness  77 y.o. female  who has presented with progressively worse dyspnea, particularly on exertion associated with wheezing without significant cough. admitted with acute HF and COPD exacerbation. 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 All education has been completed and the patient has no further questions.  Pt amb ~ 30' with quad cane and close supervision for safety. Pt feels she is returning to her baseline for mobility, she does endorse recent falls; pt was  going to OPPT prior to admission, would recommend she return to OPPT and finish her next 4 planned visits. Pt is in agreement with this plan. 2-3/4 DOE, HR 90  See below for any follow-up Physical Therapy or equipment needs. PT is signing off. Thank you for this referral.         If plan is discharge home, recommend the following:     Can travel by private vehicle        Equipment Recommendations None recommended by PT  Recommendations for Other Services       Functional Status Assessment       Precautions / Restrictions Precautions Precautions: Fall Restrictions Weight Bearing Restrictions: No       Mobility  Bed Mobility Overal bed mobility: Modified Independent                  Transfers Overall transfer level: Modified independent Equipment used: None               General transfer comment: from bed and to recliner    Ambulation/Gait Ambulation/Gait assistance: Supervision Gait Distance (Feet): 90 Feet Assistive device: Quad cane Gait Pattern/deviations: Step-through pattern, Narrow base of support, Trunk flexed Gait velocity: decr     General Gait Details: pt with mildly unsteady gait however no overt LOB. supervision for safety  Stairs            Wheelchair Mobility     Tilt Bed    Modified Rankin (Stroke Patients Only)       Balance Overall balance assessment:  (pt reports 2 falls in last 6 mos-most recetn coming in through door at home) Sitting-balance support: Feet supported, No upper extremity supported Sitting balance-Leahy Scale: Good Sitting balance - Comments: limited rotation d/t back pain   Standing balance support: During functional activity, Reliant on assistive device for balance, Single extremity supported Standing balance-Leahy Scale: Fair Standing balance comment: pt is able to static stand without support, reliant on at least unilateral UE support for dynamic task             High level balance activites: Side stepping, Turns, Direction changes, Backward walking High Level Balance Comments: close supervision for safety and device or furniture touch in room for balance             Pertinent Vitals/Pain Pain Assessment Pain Assessment: Faces Faces Pain Scale: Hurts little more Pain Location: back-chronic per pt Pain Descriptors /  Indicators: Aching, Grimacing Pain Intervention(s): Monitored during session, Repositioned    Home Living Family/patient expects to be discharged to:: Private residence Living Arrangements: Alone   Type of Home: House Home Access: Stairs to enter Entrance Stairs-Rails:  Right Entrance Stairs-Number of Steps: 3   Home Layout: One level Home Equipment: Architectural technologist (4 wheels)      Prior Function Prior Level of Function : Independent/Modified Independent;Driving             Mobility Comments: usually has groceries delivered; amb wtih quad cane or furniture walks in her home; has rollator but is unable to lift it in and out of car when alone ADLs Comments: pt reports ind with iADLs/ADLs     Extremity/Trunk Assessment        Lower Extremity Assessment Lower Extremity Assessment: Generalized weakness    Cervical / Trunk Assessment Cervical / Trunk Assessment: Kyphotic  Communication      Cognition Arousal: Alert Behavior During Therapy: WFL for tasks assessed/performed Overall Cognitive Status: Within Functional Limits for tasks assessed                                          General Comments      Exercises     Assessment/Plan    PT Assessment All further PT needs can be met in the next venue of care  PT Problem List         PT Treatment Interventions      PT Goals (Current goals can be found in the Care Plan section)  Acute Rehab PT Goals PT Goal Formulation: All assessment and education complete, DC therapy    Frequency       Co-evaluation               AM-PAC PT "6 Clicks" Mobility  Outcome Measure Help needed turning from your back to your side while in a flat bed without using bedrails?: None Help needed moving from lying on your back to sitting on the side of a flat bed without using bedrails?: None Help needed moving to and from a bed to a chair (including a wheelchair)?: None Help needed standing up from a chair using your arms (e.g., wheelchair or bedside chair)?: None Help needed to walk in hospital room?: None Help needed climbing 3-5 steps with a railing? : A Little 6 Click Score: 23    End of Session Equipment Utilized During Treatment: Gait belt Activity Tolerance:  Patient tolerated treatment well Patient left: with call bell/phone within reach;in chair;with chair alarm set   PT Visit Diagnosis: Other abnormalities of gait and mobility (R26.89);Muscle weakness (generalized) (M62.81);History of falling (Z91.81)    Time: 1191-4782 PT Time Calculation (min) (ACUTE ONLY): 12 min   Charges:   PT Evaluation $PT Eval Low Complexity: 1 Low   PT General Charges $$ ACUTE PT VISIT: 1 Visit         Marilyne Haseley, PT  Acute Rehab Dept [facility]) [phone]  08/30/2023   [facility] 08/30/2023, 12:16 PM

## 2023-08-30 NOTE — Progress Notes (Signed)
Patient received discharge orders to go home. Patient was given discharge paperwork/instructions. RN went over discharge paperwork/instructions with the patient. All questions/concerns were addressed/answered to the best of the RN's ability. Patient left the hospital stable, had discharge instructions/paperwork, and had all personal belongings.

## 2023-08-31 ENCOUNTER — Encounter (HOSPITAL_COMMUNITY): Payer: Self-pay

## 2023-09-01 ENCOUNTER — Ambulatory Visit: Payer: Medicare Other | Admitting: Internal Medicine

## 2023-09-01 DIAGNOSIS — R768 Other specified abnormal immunological findings in serum: Secondary | ICD-10-CM

## 2023-09-01 DIAGNOSIS — R918 Other nonspecific abnormal finding of lung field: Secondary | ICD-10-CM

## 2023-09-01 DIAGNOSIS — J449 Chronic obstructive pulmonary disease, unspecified: Secondary | ICD-10-CM | POA: Diagnosis not present

## 2023-09-01 LAB — PULMONARY FUNCTION TEST
DL/VA % pred: 132 %
DL/VA: 5.42 ml/min/mmHg/L
DLCO cor % pred: 77 %
DLCO cor: 14.7 ml/min/mmHg
DLCO unc % pred: 70 %
DLCO unc: 13.37 ml/min/mmHg
FEF 25-75 Pre: 0.56 L/s
FEF2575-%Pred-Pre: 37 %
FEV1-%Pred-Pre: 49 %
FEV1-Pre: 1 L
FEV1FVC-%Pred-Pre: 92 %
FEV6-%Pred-Pre: 56 %
FEV6-Pre: 1.44 L
FEV6FVC-%Pred-Pre: 105 %
FVC-%Pred-Pre: 53 %
FVC-Pre: 1.45 L
Pre FEV1/FVC ratio: 69 %
Pre FEV6/FVC Ratio: 100 %

## 2023-09-01 NOTE — Patient Instructions (Signed)
Spirometry and diffusion capacity performed today.

## 2023-09-01 NOTE — Progress Notes (Signed)
Spirometry and diffusion capacity performed today. 

## 2023-09-03 NOTE — Progress Notes (Unsigned)
Cardiology Office Note:  .   Date:  09/05/2023  ID:  [name], DOB November 19, 1945, MRN [id number] PCP: [name], MD  [facility] Providers Cardiologist:  Thurmon Fair, MD }   History of Present Illness: .   [name] is a 77 y.o. female history of second-degree AV block, anxiety, depression, hyperlipidemia, hypertension, thrombocytopenia atrial fibrillation, PUD, aortic arteriosclerosis, ascending aortic aneurysm, diastolic heart failure,AV block s/p dual chamber pacemaker ([employer], [place], 2017).   We are seeing her on hospital follow-up after admission for acute on chronic diastolic heart failure, echocardiogram revealing LVEF of greater than 75%, she was diuresed and transition to p.o. Lasix 40 mg daily with an additional 40 mg as needed edema, COPD exacerbation managed with Solu-Medrol and DuoNebs, pleural effusions which were managed by diuresis, persistent atrial fibrillation/flutter status post successful cardioversion on 08/29/2023 to follow-up with EP.  Weight on discharge 149 pounds (67.6 kg), weight was down 5 pounds after diuresis.  [name] comes today with complaints of feeling exhausted and short of breath with minimal exertion.  She is followed closely by Dr. Marchelle Gearing pulmonologist who is adjusting medications.  She is also undergoing physical rehab at home due to significant deconditioning.  She has fallen asked and is working on Print production planner.  She has several physicians that she is seeing for a multitude of issues.  She has been medically compliant.  She denies any excessive bleeding.  She denies any rapid heart rhythm or skipping.   ROS: As above otherwise negative.   Studies Reviewed: Marland Kitchen   EKG Interpretation Date/Time:  Tuesday September 05 2023 09:07:56 EST Ventricular Rate:  98 PR Interval:  144 QRS Duration:  204 QT Interval:  470 QTC Calculation: 600 R Axis:   -83  Text Interpretation: Atrial-sensed ventricular-paced  rhythm When compared with ECG of 30-Aug-2023 09:06, Vent. rate has increased BY  24 BPM Confirmed by Joni Reining [phone]) on 09/05/2023 9:44:48 AM    Echocardiogram 08/28/2023  1. Left ventricular ejection fraction, by estimation, is >75%. The left  ventricle has hyperdynamic function. The left ventricle has no regional  wall motion abnormalities. There is moderate left ventricular hypertrophy.  Left ventricular diastolic  function could not be evaluated.   2. Right ventricular systolic function reduced. The right ventricular  size is mildly enlarged. There is severely elevated pulmonary artery  systolic pressure. The estimated right ventricular systolic pressure is  64.0 mmHg.   3. Left atrial size was mildly dilated.   4. Right atrial size was mildly dilated.   5. 25 mm Uh [facility] Ease bioprosthetic valve in the mitral position  (12/08/13), well-seated, no regurgitation, findings to suggest mitral valve  stenosis (peak velocity 2.20m/s, MG , TVI ratio 3.5, EOA0.80cm2, HR  75bpm) but higher gradients can also   be seen with increased flow through the mitral valve, increased HR,  prosthesis-patient mismatch. Subvalvular calcification present. Clinical  correlation required.   6. Tricuspid valve regurgitation is moderate.   7. 21 mm magna bioprosthetic valve in the aortic position (12/08/13),  well-seated, no valvular stenosis or regurgitation (peak velocity 2.56m/s,  MG , AVA 1.4cm2, AT<100 msec, DI 0.51).   8. Aortic dilatation noted. There is dilatation of the ascending aorta,  measuring 40 mm.   9. The inferior vena cava is dilated in size with <50% respiratory  variability, suggesting right atrial pressure of 15 mmHg.   Physical Exam:   VS:  BP (!) 140/74 (BP Location: Left Arm, Patient Position: Sitting,  Cuff Size: Normal)   Pulse 96   Ht 5\' 4"  (1.626 m)   Wt 145 lb 3.2 oz (65.9 kg)   SpO2 91%   BMI 24.92 kg/m    Wt Readings from Last 3 Encounters:  09/05/23  145 lb 3.2 oz (65.9 kg)  09/04/23 145 lb 12.8 oz (66.1 kg)  08/30/23 154 lb 8.7 oz (70.1 kg)    GEN: Well nourished, well developed in no acute distress NECK: No JVD; No carotid bruits CARDIAC: RRR, no murmurs, rubs, gallops RESPIRATORY:  Clear to auscultation without rales, wheezing or rhonchi  ABDOMEN: Soft, non-tender, non-distended EXTREMITIES:  No edema; No deformity   ASSESSMENT AND PLAN: .   Chronic diastolic heart failure: Related to rheumatic valvular heart disease.  She is status post hospitalization for same.  Echo with hyperdynamic LV function.  Her weight has been stable and she is doing her best to avoid salty foods.  She occasionally orders out.  She has not had to take any doses extra of Lasix other than her daily 40 mg dose.  Discharge weight was 149 pounds, weight today in the office 145 pounds.  Will not make any changes in her medication regimen at this time.  She is to follow-up with Dr. Royann Shivers in 6 months.  She sees multiple doctors and is getting lab work done throughout the next several months.  She is to call us for significant weight gain edema or other issues related to heart failure.  She is to continue lisinopril 2.5 mg daily..  2.  Paroxysmal atrial fibrillation: She is status post cardioversion.  EKG reveals AV pacing.  She remains on Eliquis.  With her history of falls I have counseled her about being very careful using her cane and continue with physical therapy for strengthening.  She is not on any AV blocking agents currently.  She offers no complaints of rapid heart rhythm or palpitations.  I have asked her to report any resting heart rates greater than 120 bpm sustained.  She is to report any falls.  3.  Pacemaker in situ: She is followed by Dr. Elberta Fortis and also in the A-fib clinic.  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  4.  Hypertension: Blood pressure is controlled at home.  Slightly elevated today but she is very nervous.  Will not make any changes  and keep her on the lisinopril as directed.  5.  COPD: Followed closely by Dr. Marchelle Gearing and pulmonology.         Signed, Bettey Mare. Liborio Nixon, ANP, AACC

## 2023-09-04 ENCOUNTER — Ambulatory Visit: Payer: Medicare Other | Admitting: Physical Therapy

## 2023-09-04 ENCOUNTER — Ambulatory Visit: Payer: Medicare Other

## 2023-09-04 ENCOUNTER — Ambulatory Visit: Payer: Medicare Other | Admitting: Internal Medicine

## 2023-09-04 ENCOUNTER — Encounter: Payer: Self-pay | Admitting: Internal Medicine

## 2023-09-04 VITALS — BP 126/80 | HR 87 | Temp 98.4°F | Ht 64.0 in | Wt 145.8 lb

## 2023-09-04 DIAGNOSIS — Z09 Encounter for follow-up examination after completed treatment for conditions other than malignant neoplasm: Secondary | ICD-10-CM

## 2023-09-04 DIAGNOSIS — R54 Age-related physical debility: Secondary | ICD-10-CM | POA: Diagnosis not present

## 2023-09-04 DIAGNOSIS — Z2239 Carrier of other specified bacterial diseases: Secondary | ICD-10-CM | POA: Diagnosis not present

## 2023-09-04 DIAGNOSIS — R918 Other nonspecific abnormal finding of lung field: Secondary | ICD-10-CM

## 2023-09-04 DIAGNOSIS — J449 Chronic obstructive pulmonary disease, unspecified: Secondary | ICD-10-CM

## 2023-09-04 DIAGNOSIS — R5381 Other malaise: Secondary | ICD-10-CM

## 2023-09-04 DIAGNOSIS — J90 Pleural effusion, not elsewhere classified: Secondary | ICD-10-CM

## 2023-09-04 NOTE — Progress Notes (Signed)
Significant chronic back pain  HPI [name] 77 y.o. -returns for follow-up.  This visit was for her pulmonary nodules.  We did some interim workup QuantiFERON gold is negative.  ANA and rheumatoid factor are actually better than before.  No new interim issues.  She was supposed to have spirometry today and if there was worsening we were supposed to consider repeat bronchoscopy but discontinued the visit ahead of the pulmonary function test.  The pulmonary function test itself is scheduled for 4 weeks from now.  Apologize for the confusion with scheduling.  From a respiratory standpoint she is stable.  She continues to deal with significant chronic back pain.  She is agreed to come back in a couple of months..  She will make note for sure that she will have her PFT before she sees me.      Latest Reference Range & Units 12/31/07 16:04 02/04/08 16:37 05/18/11 15:19 05/27/11 11:21 06/08/11 10:56 06/22/11 13:26 07/07/11 08:41 08/10/11 11:20 08/18/11 13:20 08/27/11 03:20 09/21/11 14:05 10/21/11 09:00 11/07/11 13:15 11/09/11 18:36 12/16/11 08:43 01/10/12 09:40 01/27/12 14:39 02/17/12 08:56 03/15/12 09:23 07/16/12 09:02 11/15/12 09:08 07/05/13 08:50 08/28/13 22:05 10/10/13 11:27 11/25/13 22:10 11/24/14 15:11 01/13/15 11:03 02/25/15 10:08 10/23/15 09:03 06/15/16 09:45 11/07/16 11:06 08/22/17 11:58 11/15/17 09:48 01/22/18 12:28 05/28/18 09:41 07/22/19 09:17 10/31/19 08:41 03/10/20 17:29 06/05/20 08:09 06/09/21 08:14 01/03/22 13:31 03/21/22 08:21 06/22/22 08:04 05/07/23 09:49 06/21/23 07:29 08/01/23 08:35  Eosinophils Absolute 0.0 - 0.7 K/uL 0.1 0.0 0.2 0.2 0.2 0.4 0.5 0.2 0.2 0.1 0.2 0.1 0.1 0.1 0.2 0.1 0.1 0.1 0.2 0.2 0.2 0.2 0.0 0.0 0.1 0.1 0.1 0.1 0.2 0.2 0.1 0.0 0.1 0.1 0.1 0.1 0.1 0.0 0.2 0.2 0.1 0.2 0.1 0.2 0.2 0.2    OV 09/04/2023  Subjective:  Patient ID: [name], female , DOB: 1946/01/22 , age 77 y.o. , MRN: [id number] , ADDRESS: [address] [place]  [id number] PCP [name], MD Patient Care Team: [name], MD as PCP - General (Family Medicine) Thurmon Fair, MD as PCP - Cardiology (Cardiology) [name], MD as Consulting Physician (Gastroenterology) Dorothy Puffer, MD as Consulting Physician (Radiation Oncology) Laurice Record, MD as Consulting Physician (Hematology and Oncology) Susa Griffins, MD (Inactive) as Consulting Physician (Cardiology) [name], MD as Consulting Physician (Pulmonary Disease) [name], MD (General Surgery) Thurmon Fair, MD as Consulting Physician (Cardiology) Mckinley Jewel, MD as Consulting Physician (Ophthalmology) [place], Amy, MD as Consulting Physician (Dermatology) Drema Dallas, DO as Consulting Physician (Neurology)  This Provider for this visit: Treatment Team:  Attending Provider: [name], MD    09/04/2023 -   Chief Complaint  Patient presents with   Follow-up    Recent admission for heart failure. She has had increased DOE and has occ coughing spell- non prod.      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  Strongly positive ANA in spring 2023  -Reassured rheumatology consultation with Dr. Layne Benton 2023.   HPI [name] 77 y.o. -is here for follow-up because of August 2024 CT chest showed progressive nodularity.  Radiology still concerned about MAI but 2022 bronchoscopy only showed Pseudomonas.  We deliberate about doing another bronc in case there was lung  function worsening.  But at last visit the PFTs were not done.  So she has had PFTs this time.  The PFT does show decline in FEV1 [see below].  However according to history and review of the records she got  admitted to the hospital between 1117/24 and also 08/30/2023 for acute heart failure with preserved ejection fraction.  Of note review of the records indicate that she underwent a CT-guided bone marrow biopsy 08/23/2023 chronic anemia.  She is now on lisinopril for her heart failure.  They did treat her empirically for COPD exacerbation with steroids.  She was having some pleural effusions as well.  This was treated with IV Lasix I personally lysed the chest x-ray and confirmed the same findings. personally viewed.  At this point in time even though she is well.  She says she is physically deconditioned.  It appears home physical therapy is not set up for her.  She lives alone.  Her friends found her sick and brought her in.  Her granddaughter is going to come and visit with her but she lives in [place].Of note .       Simple office walk 185 feet x  3 laps goal with forehead probe 12/02/2022    O2 used ra   Number laps completed 2 of 3   Comments about pace slow   Resting Pulse Ox/HR 97% and 87/min   Final Pulse Ox/HR 95% and 97/min   Desaturated </= 88% no   Desaturated <= 3% points no   Got Tachycardic >/= 90/min yes   Symptoms at end of test bACK pain and stopped   Miscellaneous comments x      PFT     Latest Ref Rng & Units 09/01/2023   10:48 AM 03/13/2018   10:36 AM 04/01/2016   10:10 AM 11/29/2013    4:04 PM  ILD indicators  FVC-Pre L 1.45  P 2.21  2.22  1.49   FVC-Predicted Pre % 53  P 70  68  42   FVC-Post L   2.33  1.60   FVC-Predicted Post %   72  45   TLC L   5.00    TLC Predicted %   93    DLCO uncorrected ml/min/mmHg 13.37  P 24.55  19.21    DLCO UNC %Pred % 70  P 91  71    DLCO Corrected ml/min/mmHg 14.70  P 22.70     DLCO COR %Pred % 77  P 84       P Preliminary result      LAB RESULTS last 96 hours No results found.  LAB RESULTS last 90 days Recent Results (from the past 2160 hour(s))  Vitamin B12     Status: None   Collection Time:  06/21/23  7:29 AM  Result Value Ref Range   Vitamin B-12 633 211 - 911 pg/mL  CBC with Differential/Platelet     Status: Abnormal   Collection Time: 06/21/23  7:29 AM  Result Value Ref Range   WBC 6.1 4.0 - 10.5 K/uL   RBC 3.44 (L) 3.87 - 5.11 Mil/uL   Hemoglobin 11.3 (L) 12.0 - 15.0 g/dL   HCT 52.8 (L) 41.3 - 24.4 %   MCV 102.6 (H) 78.0 - 100.0 fl   MCHC 32.0 30.0 - 36.0 g/dL   RDW 01.0 (H) 27.2 - 53.6 %   Platelets 81.0 (L) 150.0 - 400.0 K/uL   Neutrophils Relative % 81.3 (H) 43.0 - 77.0 %  Lymphocytes Relative 5.7 Repeated and verified X2. (L) 12.0 - 46.0 %   Monocytes Relative 9.5 3.0 - 12.0 %   Eosinophils Relative 2.6 0.0 - 5.0 %   Basophils Relative 0.9 0.0 - 3.0 %   Neutro Abs 5.0 1.4 - 7.7 K/uL   Lymphs Abs 0.4 (L) 0.7 - 4.0 K/uL   Monocytes Absolute 0.6 0.1 - 1.0 K/uL   Eosinophils Absolute 0.2 0.0 - 0.7 K/uL   Basophils Absolute 0.1 0.0 - 0.1 K/uL  Microalbumin / creatinine urine ratio     Status: Abnormal   Collection Time: 06/21/23  7:29 AM  Result Value Ref Range   Microalb, Ur 3.2 (H) 0.0 - 1.9 mg/dL   Creatinine,U 57.8 mg/dL   Microalb Creat Ratio 4.5 0.0 - 30.0 mg/g  VITAMIN D 25 Hydroxy (Vit-D Deficiency, Fractures)     Status: None   Collection Time: 06/21/23  7:29 AM  Result Value Ref Range   VITD 60.82 30.00 - 100.00 ng/mL  Phosphorus     Status: None   Collection Time: 06/21/23  7:29 AM  Result Value Ref Range   Phosphorus 3.7 2.3 - 4.6 mg/dL  Comprehensive metabolic panel     Status: Abnormal   Collection Time: 06/21/23  7:29 AM  Result Value Ref Range   Sodium 140 135 - 145 mEq/L   Potassium 4.1 Hemolysis seen... 3.5 - 5.1 mEq/L   Chloride 101 96 - 112 mEq/L   CO2 34 (H) 19 - 32 mEq/L   Glucose, Bld 79 70 - 99 mg/dL   BUN 15 6 - 23 mg/dL   Creatinine, Ser 4.69 0.40 - 1.20 mg/dL   Total Bilirubin 0.8 0.2 - 1.2 mg/dL   Alkaline Phosphatase 65 39 - 117 U/L   AST 28 0 - 37 U/L   ALT 11 0 - 35 U/L   Total Protein 6.2 6.0 - 8.3 g/dL   Albumin  3.4 (L) 3.5 - 5.2 g/dL   GFR 62.95 >28.41 mL/min    Comment: Calculated using the CKD-EPI Creatinine Equation (2021)   Calcium 9.1 8.4 - 10.5 mg/dL  Lipid panel     Status: None   Collection Time: 06/21/23  7:29 AM  Result Value Ref Range   Cholesterol 152 0 - 200 mg/dL    Comment: ATP III Classification       Desirable:  < 200 mg/dL               Borderline High:  200 - 239 mg/dL          High:  > = 324 mg/dL   Triglycerides 40.1 0.0 - 149.0 mg/dL    Comment: Normal:  <027 mg/dLBorderline High:  150 - 199 mg/dL   HDL 25.36 >64.40 mg/dL   VLDL 34.7 0.0 - 42.5 mg/dL   LDL Cholesterol 67 0 - 99 mg/dL   Total CHOL/HDL Ratio 2     Comment:                Men          Women1/2 Average Risk     3.4          3.3Average Risk          5.0          4.42X Average Risk          9.6          7.13X Average Risk          15.0  11.0                       NonHDL 84.39     Comment: NOTE:  Non-HDL goal should be 30 mg/dL higher than patient's LDL goal (i.e. LDL goal of < 70 mg/dL, would have non-HDL goal of < 100 mg/dL)  Pathologist smear review     Status: None   Collection Time: 06/22/23  1:20 PM  Result Value Ref Range   Path Review      Comment: Myeloid population consists predominantly of mature segmented neutrophils with reactive changes. Thrombocytopenia. No platelet clumps identified. Anemia with RBCs which appear to macrocytic on smear review. Suggest evaluation for vitamin deficiency, if clinically indicated. Elliptocytes noted. Iron studies may be of use, if clinically indicated. Reviewed by Nehemiah Massed Mammarappallil, MD  (Electronic Signature on File)     06/23/2023   Basic metabolic panel per protocol     Status: Abnormal   Collection Time: 06/29/23  8:00 AM  Result Value Ref Range   Sodium 137 135 - 145 mmol/L   Potassium 3.8 3.5 - 5.1 mmol/L   Chloride 101 98 - 111 mmol/L   CO2 28 22 - 32 mmol/L   Glucose, Bld 84 70 - 99 mg/dL    Comment: Glucose reference range applies  only to samples taken after fasting for at least 8 hours.   BUN 18 8 - 23 mg/dL   Creatinine, Ser 1.61 0.44 - 1.00 mg/dL   Calcium 8.8 (L) 8.9 - 10.3 mg/dL   GFR, Estimated >09 >60 mL/min    Comment: (NOTE) Calculated using the CKD-EPI Creatinine Equation (2021)    Anion gap 8 5 - 15    Comment: Performed at [facility], [address], [place], [place] [address]  CBC per protocol     Status: Abnormal   Collection Time: 06/29/23  8:00 AM  Result Value Ref Range   WBC 4.6 4.0 - 10.5 K/uL   RBC 3.15 (L) 3.87 - 5.11 MIL/uL   Hemoglobin 10.2 (L) 12.0 - 15.0 g/dL   HCT 81.1 (L) 91.4 - 78.2 %   MCV 104.4 (H) 80.0 - 100.0 fL   MCH 32.4 26.0 - 34.0 pg   MCHC 31.0 30.0 - 36.0 g/dL   RDW 95.6 21.3 - 08.6 %   Platelets 86 (L) 150 - 400 K/uL    Comment: SPECIMEN CHECKED FOR CLOTS Immature Platelet Fraction may be clinically indicated, consider ordering this additional test [id number] REPEATED TO VERIFY PLATELET COUNT CONFIRMED BY SMEAR    nRBC 0.0 0.0 - 0.2 %    Comment: Performed at [facility], [address], [place], [place] [address]  Reticulocytes     Status: None   Collection Time: 06/30/23  8:16 AM  Result Value Ref Range   Retic Ct Pct 1.2 %   ABS Retic 40,560 20,000 - 80,000 cells/uL  Haptoglobin     Status: None   Collection Time: 06/30/23  8:16 AM  Result Value Ref Range   Haptoglobin 86 43 - 212 mg/dL  Lactate Dehydrogenase     Status: None   Collection Time: 06/30/23  8:16 AM  Result Value Ref Range   LDH 225 120 - 250 U/L  Ferritin     Status: None   Collection Time: 06/30/23  8:16 AM  Result Value Ref Range   Ferritin 62.3 10.0 - 291.0 ng/mL  IBC panel     Status: Abnormal  Collection Time: 06/30/23  8:16 AM  Result Value Ref Range   Iron 62 42 - 145 ug/dL   Transferrin 478.2 956.2 - 360.0 mg/dL   Saturation Ratios 13.0 (L) 20.0 - 50.0 %   TIBC 341.6 250.0 - 450.0 mcg/dL  Urinalysis, Routine w reflex microscopic      Status: Abnormal   Collection Time: 06/30/23  8:32 AM  Result Value Ref Range   Color, Urine YELLOW Yellow;Lt. Yellow;Straw;Dark Yellow;Amber;Green;Red;Brown   APPearance CLEAR Clear;Turbid;Slightly Cloudy;Cloudy   Specific Gravity, Urine 1.010 1.000 - 1.030   pH 6.5 5.0 - 8.0   Total Protein, Urine NEGATIVE Negative   Urine Glucose NEGATIVE Negative   Ketones, ur NEGATIVE Negative   Bilirubin Urine NEGATIVE Negative   Hgb urine dipstick NEGATIVE Negative   Urobilinogen, UA 0.2 0.0 - 1.0   Leukocytes,Ua TRACE (A) Negative   Nitrite NEGATIVE Negative   WBC, UA 3-6/hpf (A) 0-2/hpf   RBC / HPF none seen 0-2/hpf   Squamous Epithelial / HPF Rare(0-4/hpf) Rare(0-4/hpf)  Fecal occult blood, imunochemical     Status: Abnormal   Collection Time: 07/03/23  8:24 AM   Specimen: Stool  Result Value Ref Range   Fecal Occult Bld Positive (A) Negative  VAS [place] ABI WITH/WO TBI     Status: None   Collection Time: 07/13/23 12:15 PM  Result Value Ref Range   Right ABI 1.25    Left ABI 1.29   Basic metabolic panel per protocol     Status: Abnormal   Collection Time: 07/17/23  7:34 AM  Result Value Ref Range   Sodium 139 135 - 145 mmol/L   Potassium 4.0 3.5 - 5.1 mmol/L   Chloride 103 98 - 111 mmol/L   CO2 26 22 - 32 mmol/L   Glucose, Bld 88 70 - 99 mg/dL    Comment: Glucose reference range applies only to samples taken after fasting for at least 8 hours.   BUN 19 8 - 23 mg/dL   Creatinine, Ser 8.65 0.44 - 1.00 mg/dL   Calcium 9.1 8.9 - 78.4 mg/dL   GFR, Estimated 59 (L) >60 mL/min    Comment: (NOTE) Calculated using the CKD-EPI Creatinine Equation (2021)    Anion gap 10 5 - 15    Comment: Performed at [facility], [address], [place], [place] [address]  CBC per protocol     Status: Abnormal   Collection Time: 07/17/23  7:34 AM  Result Value Ref Range   WBC 7.8 4.0 - 10.5 K/uL   RBC 3.47 (L) 3.87 - 5.11 MIL/uL   Hemoglobin 11.5 (L) 12.0 - 15.0 g/dL   HCT 52.8 41.3  - 24.4 %   MCV 110.1 (H) 80.0 - 100.0 fL   MCH 33.1 26.0 - 34.0 pg   MCHC 30.1 30.0 - 36.0 g/dL   RDW 01.0 27.2 - 53.6 %   Platelets 112 (L) 150 - 400 K/uL    Comment: Immature Platelet Fraction may be clinically indicated, consider ordering this additional test [id number]    nRBC 0.0 0.0 - 0.2 %    Comment: Performed at [facility], [address] [place], [place] [address]  CUP PACEART REMOTE DEVICE CHECK     Status: None   Collection Time: 07/28/23  5:13 AM  Result Value Ref Range   Date Time Interrogation Session [id number]    Pulse Generator Manufacturer SJ[employer]    Pulse Gen Model 2272 Assurity MRI    Pulse Gen Serial  Number [id number]    Clinic Name [facility]    Implantable Pulse Generator Type Implantable Pulse Generator    Implantable Pulse Generator Implant Date 45409811    Implantable Lead Manufacturer [facility]    Implantable Lead Model LPA1200M Tendril MRI    Implantable Lead Serial Number [id number]    Implantable Lead Implant Date 21308657    Implantable Lead Location Detail 1 APEX    Implantable Lead Location [id number]    Implantable Lead Connection Status [id number]    Implantable Lead Manufacturer [facility]    Implantable Lead Model LPA1200M Tendril MRI    Implantable Lead Serial Number [id number]    Implantable Lead Implant Date 84132440    Implantable Lead Location Detail 1 UNKNOWN    Implantable Lead Location [id number]    Implantable Lead Connection Status [phone]    Lead Channel Setting Sensing Sensitivity 2.0 mV   Lead Channel Setting Sensing Adaptation Mode Fixed Pacing    Lead Channel Setting Pacing Amplitude 2.0 V   Lead Channel Setting Pacing Pulse Width 0.5 ms   Lead Channel Setting Pacing Amplitude 0.875    Lead Channel Status NULL    Lead Channel Impedance Value 360 ohm   Lead Channel Sensing Intrinsic Amplitude 2.7 mV   Lead Channel Pacing Threshold Amplitude 0.75 V   Lead Channel Pacing Threshold Pulse Width 0.5 ms   Lead Channel Status  NULL    Lead Channel Impedance Value 490 ohm   Lead Channel Sensing Intrinsic Amplitude 6.3 mV   Lead Channel Pacing Threshold Amplitude 0.625 V   Lead Channel Pacing Threshold Pulse Width 0.5 ms   Battery Status MOS    Battery Remaining Longevity 23 mo   Battery Remaining Percentage 22.0 %   Battery Voltage 2.86 V   Brady Statistic RA Percent Paced 65.0 %   Brady Statistic RV Percent Paced 99.0 %   Brady Statistic AP VP Percent 64.0 %   Brady Statistic AS VP Percent 35.0 %   Brady Statistic AP VS Percent 1.0 %   Brady Statistic AS VS Percent 1.0 %  CBC with Differential/Platelet     Status: Abnormal   Collection Time: 08/01/23  8:35 AM  Result Value Ref Range   WBC 6.4 4.0 - 10.5 K/uL   RBC 3.75 (L) 3.87 - 5.11 Mil/uL   Hemoglobin 12.2 12.0 - 15.0 g/dL   HCT 36.6 44.0 - 34.7 %   MCV 100.7 (H) 78.0 - 100.0 fl   MCHC 32.4 30.0 - 36.0 g/dL   RDW 42.5 95.6 - 38.7 %   Platelets 110.0 (L) 150.0 - 400.0 K/uL   Neutrophils Relative % 76.3 43.0 - 77.0 %   Lymphocytes Relative 7.4 Repeated and verified X2. (L) 12.0 - 46.0 %   Monocytes Relative 11.8 3.0 - 12.0 %   Eosinophils Relative 3.9 0.0 - 5.0 %   Basophils Relative 0.6 0.0 - 3.0 %   Neutro Abs 4.9 1.4 - 7.7 K/uL   Lymphs Abs 0.5 (L) 0.7 - 4.0 K/uL   Monocytes Absolute 0.8 0.1 - 1.0 K/uL   Eosinophils Absolute 0.2 0.0 - 0.7 K/uL   Basophils Absolute 0.0 0.0 - 0.1 K/uL  Save Smear for Provider Slide Review     Status: None   Collection Time: 08/17/23  1:04 PM  Result Value Ref Range   Smear Review SMEAR STAINED AND AVAILABLE FOR REVIEW     Comment: Performed at [facility], [address], [place], [place] [address]  CBC with Differential (  Cancer Center Only)     Status: Abnormal   Collection Time: 08/17/23  1:04 PM  Result Value Ref Range   WBC Count 6.0 4.0 - 10.5 K/uL   RBC 3.36 (L) 3.87 - 5.11 MIL/uL   Hemoglobin 10.7 (L) 12.0 - 15.0 g/dL   HCT 47.4 (L) 25.9 - 56.3 %   MCV 101.5 (H) 80.0 - 100.0 fL    MCH 31.8 26.0 - 34.0 pg   MCHC 31.4 30.0 - 36.0 g/dL   RDW 87.5 64.3 - 32.9 %   Platelet Count 79 (L) 150 - 400 K/uL    Comment: SPECIMEN CHECKED FOR CLOTS REPEATED TO VERIFY PLATELET COUNT CONFIRMED BY SMEAR    nRBC 0.0 0.0 - 0.2 %   Neutrophils Relative % 76 %   Neutro Abs 4.6 1.7 - 7.7 K/uL   Lymphocytes Relative 5 %   Lymphs Abs 0.3 (L) 0.7 - 4.0 K/uL   Monocytes Relative 13 %   Monocytes Absolute 0.8 0.1 - 1.0 K/uL   Eosinophils Relative 4 %   Eosinophils Absolute 0.2 0.0 - 0.5 K/uL   Basophils Relative 1 %   Basophils Absolute 0.0 0.0 - 0.1 K/uL   Immature Granulocytes 1 %   Abs Immature Granulocytes 0.03 0.00 - 0.07 K/uL    Comment: Performed at [facility], [address] [place], [place] [address]  Reticulocytes     Status: Abnormal   Collection Time: 08/17/23  1:04 PM  Result Value Ref Range   Retic Ct Pct 1.4 0.4 - 3.1 %   RBC. 3.33 (L) 3.87 - 5.11 MIL/uL   Retic Count, Absolute 45.0 19.0 - 186.0 K/uL   Immature Retic Fract 9.9 2.3 - 15.9 %    Comment: Performed at [facility], [address] [place], [place] [address]  Multiple Myeloma Panel (SPEP&IFE w/QIG)     Status: Abnormal   Collection Time: 08/17/23  1:04 PM  Result Value Ref Range   IgG (Immunoglobin G), Serum 938 586 - 1,602 mg/dL   IgA 301 (H) 64 - 601 mg/dL   IgM (Immunoglobulin M), Srm 91 26 - 217 mg/dL   Total Protein ELP 6.1 6.0 - 8.5 g/dL   Albumin SerPl Elph-Mcnc 3.1 2.9 - 4.4 g/dL   Alpha 1 0.3 0.0 - 0.4 g/dL   Alpha2 Glob SerPl Elph-Mcnc 0.7 0.4 - 1.0 g/dL   B-Globulin SerPl Elph-Mcnc 1.3 0.7 - 1.3 g/dL   Gamma Glob SerPl Elph-Mcnc 0.7 0.4 - 1.8 g/dL   M Protein SerPl Elph-Mcnc Not Observed Not Observed g/dL   Globulin, Total 3.0 2.2 - 3.9 g/dL   Albumin/Glob SerPl 1.1 0.7 - 1.7   IFE 1 Comment (A)     Comment: Polyclonal increase detected in one or more immunoglobulins.   Please Note Comment     Comment: (NOTE) Protein electrophoresis scan  will follow via computer, mail, or courier delivery. Performed At: [facility] [address] [place], [place] [id number] Jolene Schimke MD [place]:[id number]   Kappa/lambda light chains     Status: Abnormal   Collection Time: 08/17/23  1:04 PM  Result Value Ref Range   Kappa free light chain 82.4 (H) 3.3 - 19.4 mg/L   Lambda free light chains 38.5 (H) 5.7 - 26.3 mg/L   Kappa, lambda light chain ratio 2.14 (H) 0.26 - 1.65    Comment: (NOTE) Performed At: [facility] [address] [place], [place] [id number] Jolene Schimke MD [place]:[id number]   Surgical pathology  Status: None   Collection Time: 08/23/23 12:00 AM  Result Value Ref Range   SURGICAL PATHOLOGY      Surgical Pathology CASE: [id number] PATIENT: Rodney Sangster Bone Marrow Report     Clinical History: Macrocytosis and Thrombocytopenia     DIAGNOSIS:  BONE MARROW, ASPIRATE, CLOT, CORE: -Variably cellular bone marrow with trilineage hematopoiesis -See comment  PERIPHERAL BLOOD: -Macrocytic anemia -Lymphopenia and mild neutrophilic left shift -Thrombocytopenia  COMMENT:  The bone marrow is variably cellular with trilineage hematopoiesis. Significant dyspoiesis or increase in blastic cells is not seen.  Iron stores are decreased with no ring sideroblasts.  The findings are not considered specific or diagnostic of a myeloid neoplasm and may be secondary in nature, but clinical and cytogenetic correlation is recommended.  MICROSCOPIC DESCRIPTION:  PERIPHERAL BLOOD SMEAR: The red blood cells display mild anisopoikilocytosis with mild polychromasia.  The white blood cells are normal in number with a rare myelocyte seen on scan.  The platelets are decr eased in number.  BONE MARROW ASPIRATE: Bone marrow particles present Erythroid precursors: Progressive maturation with only occasional late precursors displaying nuclear cytoplasmic dyssynchrony. Granulocytic precursors: Orderly and  progressive maturation for the most part Megakaryocytes: Present with predominantly normal morphology Lymphocytes/plasma cells: The plasma cells represent 5% of all cells with lack of large aggregates or sheets.  Large lymphoid aggregates are not seen.  TOUCH PREPARATIONS: A mixture of cell types present in background of blood.  CLOT AND BIOPSY: The core biopsy shows extensive aspiration artifacts. The clot sections show numerous bone marrow particles displaying cellularity ranging from 10% to 40% focally with a mixture of cell types.  Large clusters or sheets of plasma cells are not identified. Two small interstitial and well-circumscribed lymphoid aggregates composed of small lymphoid cells are seen.  Immunohistochemical stain for CD 138 and in situ hybridization for kappa and lambda were performed on block B1 with appropriate controls.  CD138 highlights a minor plasma cell component consisting of interstitial cells and occasional small clusters composed of few cells and displays polyclonal staining pattern for kappa and lambda light chains.  IRON STAIN: Iron stains are performed on a bone marrow aspirate or touch imprint smear and section of clot. The controls stained appropriately.       Storage Iron: Decreased      Ring Sideroblasts: Absent  ADDITIONAL DATA/TESTING: The specimen was sent for cytogenetic analysis and a separate report will follow.  CELL COUNT DATA:  Bone Marrow count performed on 500 cells shows: Blasts:   0%   Myeloid:  53% Promyelocytes: 0%   Erythroid:     32% Myelocytes:    4%   Lymphocytes:   7% Metamyelocytes:     3%   Plasma cells:  5% Bands:    6% Neutrophils:   34%  M:E ratio:     1.66 Eosinophils:   6% Basophils:     0% Monocytes:     3%  Lab Data: CBC perfor med on 08/23/2023 shows: WBC: 8.5 k/uL  Neutrophils:   86% Hgb: 11.5 g/dL Lymphocytes:   3% HCT: 37.2 %    Monocytes:     7% MCV: 103.6 fL  Eosinophils:   4% RDW: 14.4 %     Basophils:     0% PLT: 100 k/uL    GROSS DESCRIPTION:  A.  BM-aspirate smear  B.  Received in B-plus fixative is a 1.8 x 1.5 x 0.2 cm aggregate of tissue.  Submitted entirely in B1.  C.  Received  in B-plus fixative are 7 fragments of firm bone ranging from 0.2 to 1.0 cm in length and each measuring 0.2 cm in diameter. Submitted entirely in C1, following decalcification in Immunocal. (KW, 08/23/2023)   Final Diagnosis performed by Guerry Bruin, MD.   Electronically signed 08/25/2023 Technical and / or Professional components performed at [facility], [address], [place], [place] [address].  Immunohistochemistry Technical component (if applicable) was performed at [facility]. [address], [place], [place] [address].   IMMUNOHISTOCHEMIS TRY DISCLAIMER (if applicable): Some of these immunohistochemical stains may have been developed and the performance characteristics determine by [facility]. Some may not have been cleared or approved by the U.S. Food and Drug Administration. The FDA has determined that such clearance or approval is not necessary. This test is used for clinical purposes. It should not be regarded as investigational or for research. This laboratory is certified under the Clinical Laboratory Improvement Amendments of 1988 (CLIA-88) as qualified to perform high complexity clinical laboratory testing.  The controls stained appropriately.   IHC stains are performed on formalin fixed, paraffin embedded tissue using a 3,3"diaminobenzidine (DAB) chromogen and Leica Bond Autostainer System. The staining intensity of the nucleus is score manually and is reported as the percentage of tumor cell nuclei demonstrating specific nuclear staining. The specimens are fixed in 10% Neutral Forma lin for at least 6 hours and up to 72hrs. These tests are validated on decalcified tissue. Results should be interpreted  with caution given the possibility of false negative results on decalcified specimens. Antibody Clones are as follows ER-clone 72F, PR-clone 16, Ki67- clone MM1. Some of these immunohistochemical stains may have been developed and the performance characteristics determined by [facility].   CBC with Differential/Platelet     Status: Abnormal   Collection Time: 08/23/23  6:49 AM  Result Value Ref Range   WBC 8.5 4.0 - 10.5 K/uL   RBC 3.59 (L) 3.87 - 5.11 MIL/uL   Hemoglobin 11.5 (L) 12.0 - 15.0 g/dL   HCT 57.8 46.9 - 62.9 %   MCV 103.6 (H) 80.0 - 100.0 fL   MCH 32.0 26.0 - 34.0 pg   MCHC 30.9 30.0 - 36.0 g/dL   RDW 52.8 41.3 - 24.4 %   Platelets 100 (L) 150 - 400 K/uL    Comment: SPECIMEN CHECKED FOR CLOTS Immature Platelet Fraction may be clinically indicated, consider ordering this additional test [id number] CONSISTENT WITH PREVIOUS RESULT REPEATED TO VERIFY    nRBC 0.0 0.0 - 0.2 %   Neutrophils Relative % 82 %   Neutro Abs 7.0 1.7 - 7.7 K/uL   Lymphocytes Relative 3 %   Lymphs Abs 0.3 (L) 0.7 - 4.0 K/uL   Monocytes Relative 9 %   Monocytes Absolute 0.8 0.1 - 1.0 K/uL   Eosinophils Relative 4 %   Eosinophils Absolute 0.3 0.0 - 0.5 K/uL   Basophils Relative 1 %   Basophils Absolute 0.1 0.0 - 0.1 K/uL   Immature Granulocytes 1 %   Abs Immature Granulocytes 0.04 0.00 - 0.07 K/uL    Comment: Performed at [facility], [address] [place], [place] [address]  CBC with Differential     Status: Abnormal   Collection Time: 08/27/23 10:52 AM  Result Value Ref Range   WBC 5.9 4.0 - 10.5 K/uL   RBC 3.19 (L) 3.87 - 5.11 MIL/uL   Hemoglobin 10.5 (L) 12.0 - 15.0 g/dL   HCT 34.7 (L)  36.0 - 46.0 %   MCV 101.9 (H) 80.0 - 100.0 fL   MCH 32.9 26.0 - 34.0 pg   MCHC 32.3 30.0 - 36.0 g/dL   RDW 16.1 09.6 - 04.5 %   Platelets 100 (L) 150 - 400 K/uL    Comment: SPECIMEN CHECKED FOR CLOTS REPEATED TO VERIFY    nRBC 0.0 0.0 - 0.2 %   Neutrophils Relative %  80 %   Neutro Abs 4.8 1.7 - 7.7 K/uL   Lymphocytes Relative 4 %   Lymphs Abs 0.3 (L) 0.7 - 4.0 K/uL   Monocytes Relative 11 %   Monocytes Absolute 0.6 0.1 - 1.0 K/uL   Eosinophils Relative 4 %   Eosinophils Absolute 0.2 0.0 - 0.5 K/uL   Basophils Relative 1 %   Basophils Absolute 0.1 0.0 - 0.1 K/uL   Immature Granulocytes 0 %   Abs Immature Granulocytes 0.02 0.00 - 0.07 K/uL    Comment: Performed at [facility], [address] [place], [place] [address]  Troponin I (High Sensitivity)     Status: Abnormal   Collection Time: 08/27/23 10:52 AM  Result Value Ref Range   Troponin I (High Sensitivity) 121 (HH) <18 ng/L    Comment: CRITICAL RESULT CALLED TO, READ BACK BY AND VERIFIED WITH KISER,C. RN AT 1152 08/27/23 MULLINS,T (NOTE) Elevated high sensitivity troponin I (hsTnI) values and significant  changes across serial measurements may suggest ACS but many other  chronic and acute conditions are known to elevate hsTnI results.  Refer to the "Links" section for chest pain algorithms and additional  guidance. Performed at [facility], [address], [place], [place] [address]   Brain natriuretic peptide     Status: Abnormal   Collection Time: 08/27/23 10:52 AM  Result Value Ref Range   B Natriuretic Peptide 293.9 (H) 0.0 - 100.0 pg/mL    Comment: Performed at [facility], [address], [place], [place] [address]  Comprehensive metabolic panel     Status: Abnormal   Collection Time: 08/27/23 10:52 AM  Result Value Ref Range   Sodium 137 135 - 145 mmol/L   Potassium 3.7 3.5 - 5.1 mmol/L   Chloride 101 98 - 111 mmol/L   CO2 28 22 - 32 mmol/L   Glucose, Bld 101 (H) 70 - 99 mg/dL    Comment: Glucose reference range applies only to samples taken after fasting for at least 8 hours.   BUN 15 8 - 23 mg/dL   Creatinine, Ser 2.13 0.44 - 1.00 mg/dL   Calcium 9.0 8.9 - 08.6 mg/dL   Total Protein 6.5 6.5 - 8.1 g/dL   Albumin  3.1 (L) 3.5 - 5.0 g/dL   AST 15 15 - 41 U/L   ALT 9 0 - 44 U/L   Alkaline Phosphatase 60 38 - 126 U/L   Total Bilirubin 0.9 <1.2 mg/dL   GFR, Estimated >57 >84 mL/min    Comment: (NOTE) Calculated using the CKD-EPI Creatinine Equation (2021)    Anion gap 8 5 - 15    Comment: Performed at [facility], [address], [place], [place] [address]  Resp panel by RT-PCR (RSV, Flu A&B, Covid) Anterior Nasal Swab     Status: None   Collection Time: 08/27/23 10:53 AM   Specimen: Anterior Nasal Swab  Result Value Ref Range   SARS Coronavirus 2 by RT PCR NEGATIVE NEGATIVE    Comment: (NOTE) SARS-CoV-2 target nucleic acids are  NOT DETECTED.  The SARS-CoV-2 RNA is generally detectable in upper respiratory specimens during the acute phase of infection. The lowest concentration of SARS-CoV-2 viral copies this assay can detect is 138 copies/mL. A negative result does not preclude SARS-Cov-2 infection and should not be used as the sole basis for treatment or other patient management decisions. A negative result may occur with  improper specimen collection/handling, submission of specimen other than nasopharyngeal swab, presence of viral mutation(s) within the areas targeted by this assay, and inadequate number of viral copies(<138 copies/mL). A negative result must be combined with clinical observations, patient history, and epidemiological information. The expected result is Negative.  Fact Sheet for Patients:  BloggerCourse.com  Fact Sheet for Healthcare Providers:  SeriousBroker.it  This test is no t yet approved or cleared by the [place] FDA and  has been authorized for detection and/or diagnosis of SARS-CoV-2 by FDA under an Emergency Use Authorization (EUA). This EUA will remain  in effect (meaning this test can be used) for the duration of the COVID-19 declaration under Section 564(b)(1) of the Act,  21 U.S.C.section 360bbb-3(b)(1), unless the authorization is terminated  or revoked sooner.       Influenza A by PCR NEGATIVE NEGATIVE   Influenza B by PCR NEGATIVE NEGATIVE    Comment: (NOTE) The Xpert Xpress SARS-CoV-2/FLU/RSV plus assay is intended as an aid in the diagnosis of influenza from Nasopharyngeal swab specimens and should not be used as a sole basis for treatment. Nasal washings and aspirates are unacceptable for Xpert Xpress SARS-CoV-2/FLU/RSV testing.  Fact Sheet for Patients: BloggerCourse.com  Fact Sheet for Healthcare Providers: SeriousBroker.it  This test is not yet approved or cleared by the [place] FDA and has been authorized for detection and/or diagnosis of SARS-CoV-2 by FDA under an Emergency Use Authorization (EUA). This EUA will remain in effect (meaning this test can be used) for the duration of the COVID-19 declaration under Section 564(b)(1) of the Act, 21 U.S.C. section 360bbb-3(b)(1), unless the authorization is terminated or revoked.     Resp Syncytial Virus by PCR NEGATIVE NEGATIVE    Comment: (NOTE) Fact Sheet for Patients: BloggerCourse.com  Fact Sheet for Healthcare Providers: SeriousBroker.it  This test is not yet approved or cleared by the [place] FDA and has been authorized for detection and/or diagnosis of SARS-CoV-2 by FDA under an Emergency Use Authorization (EUA). This EUA will remain in effect (meaning this test can be used) for the duration of the COVID-19 declaration under Section 564(b)(1) of the Act, 21 U.S.C. section 360bbb-3(b)(1), unless the authorization is terminated or revoked.  Performed at [facility], [address], [place], [place] [address]   Troponin I (High Sensitivity)     Status: Abnormal   Collection Time: 08/27/23  1:05 PM  Result Value Ref Range   Troponin I (High  Sensitivity) 136 (HH) <18 ng/L    Comment: CRITICAL VALUE NOTED. VALUE IS CONSISTENT WITH PREVIOUSLY REPORTED/CALLED VALUE (NOTE) Elevated high sensitivity troponin I (hsTnI) values and significant  changes across serial measurements may suggest ACS but many other  chronic and acute conditions are known to elevate hsTnI results.  Refer to the "Links" section for chest pain algorithms and additional  guidance. Performed at [facility], [address] [place], [place] [address]   CBC     Status: Abnormal   Collection Time: 08/28/23  4:05 AM  Result Value Ref Range   WBC 3.5 (L) 4.0 - 10.5 K/uL   RBC  3.26 (L) 3.87 - 5.11 MIL/uL   Hemoglobin 10.5 (L) 12.0 - 15.0 g/dL   HCT 09.8 (L) 11.9 - 14.7 %   MCV 101.2 (H) 80.0 - 100.0 fL   MCH 32.2 26.0 - 34.0 pg   MCHC 31.8 30.0 - 36.0 g/dL   RDW 82.9 56.2 - 13.0 %   Platelets 88 (L) 150 - 400 K/uL    Comment: Immature Platelet Fraction may be clinically indicated, consider ordering this additional test [id number]    nRBC 0.0 0.0 - 0.2 %    Comment: Performed at [facility], [address] [place], [place] [address]  Comprehensive metabolic panel     Status: Abnormal   Collection Time: 08/28/23  4:05 AM  Result Value Ref Range   Sodium 136 135 - 145 mmol/L   Potassium 4.3 3.5 - 5.1 mmol/L   Chloride 99 98 - 111 mmol/L   CO2 31 22 - 32 mmol/L   Glucose, Bld 125 (H) 70 - 99 mg/dL    Comment: Glucose reference range applies only to samples taken after fasting for at least 8 hours.   BUN 20 8 - 23 mg/dL   Creatinine, Ser 8.41 0.44 - 1.00 mg/dL   Calcium 9.1 8.9 - 32.4 mg/dL   Total Protein 6.4 (L) 6.5 - 8.1 g/dL   Albumin 3.0 (L) 3.5 - 5.0 g/dL   AST 16 15 - 41 U/L   ALT 10 0 - 44 U/L   Alkaline Phosphatase 62 38 - 126 U/L   Total Bilirubin 0.6 <1.2 mg/dL   GFR, Estimated >40 >10 mL/min    Comment: (NOTE) Calculated using the CKD-EPI Creatinine Equation (2021)    Anion gap 6 5 - 15     Comment: Performed at [facility], [address] [place], [place] [address]  ECHOCARDIOGRAM COMPLETE     Status: None   Collection Time: 08/28/23  9:02 AM  Result Value Ref Range   Weight 2,384.5 oz   Height 64 in   BP 152/83 mmHg   S' Lateral 1.60 cm   AR max vel 1.52 cm2   AV Area VTI 1.44 cm2   AV Mean grad 13.0 mmHg   AV Peak grad 20.8 mmHg   Ao pk vel 2.28 m/s   Area-P 1/2 2.55 cm2   AV Area mean vel 1.32 cm2   MV VTI 0.80 cm2   Est EF > 75%   Basic metabolic panel     Status: Abnormal   Collection Time: 08/29/23  4:02 AM  Result Value Ref Range   Sodium 136 135 - 145 mmol/L   Potassium 4.1 3.5 - 5.1 mmol/L   Chloride 97 (L) 98 - 111 mmol/L   CO2 30 22 - 32 mmol/L   Glucose, Bld 110 (H) 70 - 99 mg/dL    Comment: Glucose reference range applies only to samples taken after fasting for at least 8 hours.   BUN 25 (H) 8 - 23 mg/dL   Creatinine, Ser 6.64 0.44 - 1.00 mg/dL   Calcium 9.2 8.9 - 40.3 mg/dL   GFR, Estimated >47 >42 mL/min    Comment: (NOTE) Calculated using the CKD-EPI Creatinine Equation (2021)    Anion gap 9 5 - 15    Comment: Performed at [facility], [address], [place], [place] [address]  Magnesium     Status: None   Collection Time: 08/29/23  4:02 AM  Result Value Ref Range   Magnesium  2.2 1.7 - 2.4 mg/dL    Comment: Performed at [facility], [address] [place], [place] [address]  CBC     Status: Abnormal   Collection Time: 08/29/23  4:02 AM  Result Value Ref Range   WBC 5.6 4.0 - 10.5 K/uL   RBC 3.25 (L) 3.87 - 5.11 MIL/uL   Hemoglobin 10.5 (L) 12.0 - 15.0 g/dL   HCT 44.0 (L) 10.2 - 72.5 %   MCV 102.5 (H) 80.0 - 100.0 fL   MCH 32.3 26.0 - 34.0 pg   MCHC 31.5 30.0 - 36.0 g/dL   RDW 36.6 44.0 - 34.7 %   Platelets 90 (L) 150 - 400 K/uL    Comment: Immature Platelet Fraction may be clinically indicated, consider ordering this additional test [id number] REPEATED TO VERIFY     nRBC 0.0 0.0 - 0.2 %    Comment: Performed at [facility], [address], [place], [place] [address]  ECHO TEE     Status: None   Collection Time: 08/29/23  9:54 AM  Result Value Ref Range   AV Mean grad 12.0 mmHg   AV Peak grad 19.0 mmHg   Ao pk vel 2.18 m/s   Est EF 60 - 65%   Basic metabolic panel     Status: Abnormal   Collection Time: 08/30/23  4:29 AM  Result Value Ref Range   Sodium 132 (L) 135 - 145 mmol/L   Potassium 4.4 3.5 - 5.1 mmol/L   Chloride 97 (L) 98 - 111 mmol/L   CO2 28 22 - 32 mmol/L   Glucose, Bld 120 (H) 70 - 99 mg/dL    Comment: Glucose reference range applies only to samples taken after fasting for at least 8 hours.   BUN 26 (H) 8 - 23 mg/dL   Creatinine, Ser 3.29 0.44 - 1.00 mg/dL   Calcium 8.9 8.9 - 51.8 mg/dL   GFR, Estimated >84 >16 mL/min    Comment: (NOTE) Calculated using the CKD-EPI Creatinine Equation (2021)    Anion gap 7 5 - 15    Comment: Performed at [facility], [address], [place], [place] [address]  CBC     Status: Abnormal   Collection Time: 08/30/23  4:29 AM  Result Value Ref Range   WBC 5.8 4.0 - 10.5 K/uL   RBC 3.43 (L) 3.87 - 5.11 MIL/uL   Hemoglobin 10.8 (L) 12.0 - 15.0 g/dL   HCT 16.0 10.9 - 32.3 %   MCV 105.0 (H) 80.0 - 100.0 fL   MCH 31.5 26.0 - 34.0 pg   MCHC 30.0 30.0 - 36.0 g/dL   RDW 55.7 32.2 - 02.5 %   Platelets 90 (L) 150 - 400 K/uL    Comment: Immature Platelet Fraction may be clinically indicated, consider ordering this additional test [id number] CONSISTENT WITH PREVIOUS RESULT    nRBC 0.0 0.0 - 0.2 %    Comment: Performed at [facility], [address] [place], [place] [address]  TSH     Status: None   Collection Time: 08/30/23  4:29 AM  Result Value Ref Range   TSH 0.701 0.350 - 4.500 uIU/mL    Comment: Performed by a 3rd Generation assay with a functional sensitivity of <=0.01 uIU/mL. Performed at [facility], [address] [place], [place] [address]   Magnesium     Status: None   Collection Time: 08/30/23  4:29 AM  Result Value Ref  Range   Magnesium 2.2 1.7 - 2.4 mg/dL    Comment: Performed at [facility], [address], [place], [place] [address]  Pulmonary function test     Status: None (Preliminary result)   Collection Time: 09/01/23 10:48 AM  Result Value Ref Range   FVC-Pre 1.45 L   FVC-%Pred-Pre 53 %   FEV1-Pre 1.00 L   FEV1-%Pred-Pre 49 %   FEV6-Pre 1.44 L   FEV6-%Pred-Pre 56 %   Pre FEV1/FVC ratio 69 %   FEV1FVC-%Pred-Pre 92 %   Pre FEV6/FVC Ratio 100 %   FEV6FVC-%Pred-Pre 105 %   FEF 25-75 Pre 0.56 L/sec   FEF2575-%Pred-Pre 37 %   DLCO unc 13.37 ml/min/mmHg   DLCO unc % pred 70 %   DLCO cor 14.70 ml/min/mmHg   DLCO cor % pred 77 %   DL/VA 1.30 ml/min/mmHg/L   DL/VA % pred 865 %         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 (12/31/2016), Acute cystitis without hematuria (12/22/2015), Acute right-sided low back pain without sciatica (11/26/2019), Adjustment disorder with depressed mood (10/15/2014), Anemia, Anticoagulant long-term use (06/14/2019), Aortic atherosclerosis (03/03/2022), Arthritis, Arthropathy of spinal facet joint (03/08/2018), Ascending aortic aneurysm (03/02/2021), Bilateral hip pain (09/08/2016), Cancer (HCC), CAP (community acquired pneumonia) (02/08/2016), Carotid stenosis (12/08/2020), Cataracts, bilateral, Chest pain (08/25/2014), Chronic cholecystitis with calculus (01/21/2015), Chronic diastolic heart failure (09/19/2014), Chronic insomnia, Chronic lower back pain (03/05/2008), CKD (chronic kidney disease) stage 2, GFR 60-89 ml/min (07/03/2015), Constipation, COPD (chronic obstructive pulmonary disease), COVID-19 virus infection (11/11/2022), DDD (degenerative disc disease), Depression, DISH (diffuse idiopathic skeletal hyperostosis) (05/14/2019), Dyspnea on exertion (06/05/2015), Edema of both lower legs due to  peripheral venous insufficiency, Essential hypertension, benign (03/24/2009), Heart murmur, Hematuria (06/17/2021), History of kidney stones, History of radiation therapy (05/30/11 to 07/07/11), History of rectal cancer (05/26/2011), History of shingles, History of vertebral fracture (11/26/2019), Hyperlipemia, Lacunar infarction, Left hip pain (07/29/2020), Legally blind in left eye, 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), Macrocytosis (03/27/2022), Malaise and fatigue (01/22/2018), Meralgia paresthetica (12/13/2007), 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), Obesity, Osteopenia (12/2014), Other spondylosis with radiculopathy, lumbar region (07/17/2018), Pain of right sacroiliac joint (08/09/2017), Pancreatitis (11/2014), Peripheral neuropathy (01/03/2022), Persistent atrial fibrillation (12/09/2020), Personal history of colonic polyps, PONV (postoperative nausea and vomiting), Positive occult stool blood test, Presence of permanent cardiac pacemaker, 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), 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), Pulmonary artery hypertension (06/17/2021), Pulmonary infiltrates (04/23/2019), Pulmonary nodule (02/23/2012), Rheumatic heart disease mitral stenosis, Right leg swelling (06/11/2020), 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   reports that she quit smoking about 48 years ago. Her smoking use included cigarettes. She started smoking about 58 years ago. She has a 15 pack-year  smoking history. She has never used smokeless tobacco.  Past Surgical History:  Procedure Laterality Date   ANTERIOR LAT LUMBAR FUSION Left 07/17/2018   Procedure: Lumbar Two-Three Lumbar Three-Four Anterolateral decompression/interbody fusion with lateral plate fixation/Infuse;  Surgeon: Barnett Abu, MD;  Location: [facility] OR;  Service: Neurosurgery;  Laterality: Left;  Lumbar Two-Three Lumbar Three-Four Anterolateral decompression/interbody fusion with lateral plate fixation/Infuse   AORTIC VALVE REPLACEMENT N/A 12/12/2013  Procedure: AORTIC VALVE REPLACEMENT (AVR);  Surgeon: Alleen Borne, MD; Service: Open Heart Surgery   BACK SURGERY  2006, 2007   2006 SPACER, 2007 decompression and fusion L4/5   BOWEL RESECTION  04/02/2012   Procedure: SMALL BOWEL RESECTION;  Surgeon: [name], MD;  Location: [facility] ORS;  Service: General;  Laterality: N/A;   BRONCHIAL WASHINGS  03/24/2021   Procedure: BRONCHIAL WASHINGS;  Surgeon: [name], MD;  Location: [facility] ENDOSCOPY;  Service: Endoscopy;;   CARDIOVERSION N/A 08/29/2023   Procedure: CARDIOVERSION (CATH LAB);  Surgeon: Lewayne Bunting, MD;  Location: [facility] INVASIVE CV LAB;  Service: Cardiovascular;  Laterality: N/A;   CARPAL TUNNEL RELEASE Bilateral    CATARACT EXTRACTION W/ INTRAOCULAR LENS IMPLANT Left 10/18/2016   CATARACT EXTRACTION W/ INTRAOCULAR LENS IMPLANT Right 12/13/2016   Dr. Dagoberto Ligas   CHOLECYSTECTOMY N/A 01/21/2015   chronic cholecystitis, [name], MD   COLON RESECTION  08/25/2011   Procedure: COLON RESECTION LAPAROSCOPIC;  Surgeon: [name], MD;  Location: [facility]S;  Service: General;  Laterality: N/A;  Laparoscopic Assisted Low Anterior Resection Diverting Ostomy and onQ pain pump   COLONOSCOPY  03/2013   1 polyp, rpt 3 yrs Christella Hartigan)   COLONOSCOPY  05/2021   diverticulosis and patent colon anastomosis with some edema/narrowing, rpt 5 yrs Christella Hartigan)   COLONOSCOPY WITH PROPOFOL N/A 06/09/2016   patent colo-colonic  anastomosis, rpt 5 yrs Christella Hartigan)   CYSTOSCOPY/URETEROSCOPY/HOLMIUM LASER/STENT PLACEMENT Right 07/03/2023   Procedure: CYSTOSCOPY RIGHT RETROGRADE PYEPLGRAM RIGHT URETEROSCOPY/HOLMIUM LASER/STENT PLACEMENT;  Surgeon: Crista Elliot, MD;  Location: [facility];  Service: Urology;  Laterality: Right;  1 HR FOR CASE   CYSTOSCOPY/URETEROSCOPY/HOLMIUM LASER/STENT PLACEMENT Right 07/17/2023   Procedure: CYSTOSCOPY/RIGHT URETEROSCOPY/STENT EXCHANGE;  Surgeon: Crista Elliot, MD;  Location: [facility];  Service: Urology;  Laterality: Right;  60 MINS FOR CASE   EP IMPLANTABLE DEVICE N/A 06/16/2016   Procedure: Pacemaker Implant;  Surgeon: Will Jorja Loa, MD;  Location: [facility] INVASIVE CV LAB;  Service: Cardiovascular;  Laterality: N/A;   FOOT SURGERY  left foot   hammer toe   ILEOSTOMY  08/25/2011   ILEOSTOMY CLOSURE  04/02/2012   Procedure: ILEOSTOMY TAKEDOWN;  Surgeon: [name], MD;  Location: [facility];  Service: General;  Laterality: N/A;   INTRAOPERATIVE TRANSESOPHAGEAL ECHOCARDIOGRAM N/A 12/12/2013   Procedure: INTRAOPERATIVE TRANSESOPHAGEAL ECHOCARDIOGRAM;  Surgeon: Alleen Borne, MD;  Location: [facility] OR;  Service: Open Heart Surgery;  Laterality: N/A;   LEFT AND RIGHT HEART CATHETERIZATION WITH CORONARY ANGIOGRAM N/A 11/27/2013   Procedure: LEFT AND RIGHT HEART CATHETERIZATION WITH CORONARY ANGIOGRAM;  Surgeon: Thurmon Fair, MD;  Location: [facility] CATH LAB;  Service: Cardiovascular;  Laterality: N/A;   MITRAL VALVE REPLACEMENT N/A 12/12/2013   Procedure: MITRAL VALVE (MV) REPLACEMENT OR REPAIR;  Surgeon: Alleen Borne, MD; Service: Open Heart Surgery   TEE WITHOUT CARDIOVERSION N/A 11/29/2013   Procedure: TRANSESOPHAGEAL ECHOCARDIOGRAM (TEE);  Surgeon: Quintella Reichert, MD;  Location: [facility] ENDOSCOPY;  Service: Cardiovascular;  Laterality: N/A;   TEE WITHOUT CARDIOVERSION N/A 06/04/2019   Procedure: TRANSESOPHAGEAL ECHOCARDIOGRAM (TEE);  Surgeon: Thurmon Fair, MD;  Location: [facility] ENDOSCOPY;  Service:  Cardiovascular;  Laterality: N/A;   TONSILLECTOMY     TRANSESOPHAGEAL ECHOCARDIOGRAM (CATH LAB) N/A 08/29/2023   Procedure: TRANSESOPHAGEAL ECHOCARDIOGRAM;  Surgeon: Lewayne Bunting, MD;  Location: [facility] INVASIVE CV LAB;  Service: Cardiovascular;  Laterality: N/A;   VIDEO BRONCHOSCOPY N/A 03/24/2021   Procedure: VIDEO BRONCHOSCOPY WITHOUT FLUORO;  Surgeon: [name], MD;  Location: [facility] ENDOS[facility];  Service: Endoscopy;  Laterality: N/A;  SMALL SCOPE,PREMEDICATE WITH NEBULIZED LIDOCAINE    Allergies  Allergen Reactions   Tape Other (See Comments)    "THICK TAPES PULLS OFF MY SKIN"   Codeine Nausea Only   Prednisone Nausea Only   Sulfa Antibiotics Nausea Only    Immunization History  Administered Date(s) Administered   Fluad Quad(high Dose 65+) 07/11/2019, 07/14/2020, 07/25/2022   Fluad Trivalent(High Dose 65+) 08/30/2023   H1N1 09/17/2008   Influenza Split 07/18/2012   Influenza Whole 08/10/2005, 06/24/2009, 07/11/2011   Influenza, High Dose Seasonal PF 07/04/2016, 07/04/2017, 07/27/2021   Influenza,inj,Quad PF,6+ Mos 07/09/2018   Influenza-Unspecified 07/24/2013, 06/30/2014, 06/30/2015   Moderna Covid-19 Vaccine Bivalent Booster 70yrs & up 07/02/2021, 07/24/2023   Moderna SARS-COV2 Booster Vaccination 08/14/2020   Moderna Sars-Covid-2 Vaccination 02/26/2021   PFIZER(Purple Top)SARS-COV-2 Vaccination 10/30/2019, 11/20/2019   Pfizer(Comirnaty)Fall Seasonal Vaccine 12 years and older 08/15/2022   Pneumococcal Conjugate-13 06/02/2014   Pneumococcal Polysaccharide-23 08/10/2005, 04/21/2008, 06/25/2013   Respiratory Syncytial Virus Vaccine,Recomb Aduvanted(Arexvy) 07/13/2022   Td 10/11/2005   Zoster Recombinant(Shingrix) 06/25/2018, 08/28/2018   Zoster, Live 09/16/2014    Family History  Problem Relation Age of Onset   Heart disease Mother    Diabetes Mother    Hypertension Mother    Stroke Mother    Lung cancer Father    Breast cancer Maternal Aunt 33   Heart attack  Maternal Grandfather    CAD Son 34   Heart attack Son 16   Rectal cancer Neg Hx    Stomach cancer Neg Hx    Esophageal cancer Neg Hx    Colon cancer Neg Hx      Current Outpatient Medications:    acetaminophen (TYLENOL) 500 MG tablet, Take 2 tablets (1,000 mg total) by mouth daily. (Patient taking differently: Take 1,000 mg by mouth in the morning.), Disp: , Rfl:    acidophilus (RISAQUAD) CAPS capsule, Take 1 capsule by mouth daily., Disp: , Rfl:    albuterol (VENTOLIN HFA) 108 (90 Base) MCG/ACT inhaler, Inhale 2 puffs into the lungs every 6 (six) hours as needed for wheezing., Disp: 18 g, Rfl: 6   alendronate (FOSAMAX) 70 MG tablet, Take 1 tablet (70 mg total) by mouth every 7 (seven) days. Take with a full glass of water on an empty stomach., Disp: 4 tablet, Rfl: 11   ascorbic acid (VITAMIN C) 500 MG tablet, Take 1 tablet (500 mg total) by mouth daily., Disp: , Rfl:    Biotin 1000 MCG tablet, Take 1,000 mcg by mouth daily., Disp: , Rfl:    buPROPion (WELLBUTRIN SR) 150 MG 12 hr tablet, TAKE 1 TABLET(150 MG) BY MOUTH EVERY MORNING (Patient taking differently: Take 150 mg by mouth in the morning.), Disp: 90 tablet, Rfl: 1   calcium carbonate (OSCAL) 1500 (600 Ca) MG TABS tablet, Take 600 mg of elemental calcium by mouth daily., Disp: , Rfl:    carboxymethylcellulose (REFRESH PLUS) 0.5 % SOLN, Place 1 drop into both eyes 3 (three) times daily as needed (dry eyes)., Disp: , Rfl:    cephALEXin (KEFLEX) 250 MG capsule, Take 250 mg by mouth daily with breakfast., Disp: , Rfl:    cholecalciferol (VITAMIN D3) 25 MCG (1000 UNIT) tablet, Take 1,000 Units by mouth daily., Disp: , Rfl:    docusate sodium (COLACE) 100 MG capsule, Take 200 mg by mouth daily., Disp: , Rfl:    ELIQUIS 5 MG TABS tablet, TAKE 1 TABLET(5 MG) BY MOUTH TWICE DAILY (Patient taking differently: Take 5 mg  by mouth in the morning and at bedtime.), Disp: 180 tablet, Rfl: 1   fluticasone furoate-vilanterol (BREO ELLIPTA) 200-25  MCG/ACT AEPB, Inhale 1 puff into the lungs daily., Disp: , Rfl:    furosemide (LASIX) 40 MG tablet, TAKE 1 TABLET BY MOUTH EVERY DAY. MAY TAKE AN EXTRA TABLET AS NEEDED FOR SWELLING (Patient taking differently: Take 40 mg by mouth in the morning.), Disp: 135 tablet, Rfl: 2   gabapentin (NEURONTIN) 100 MG capsule, Take 1 capsule (100 mg total) by mouth 2 (two) times daily as needed (back pain - for daytime use). (Patient taking differently: Take 100 mg by mouth daily.), Disp: 60 capsule, Rfl: 1   gabapentin (NEURONTIN) 300 MG capsule, Take 300 mg by mouth at bedtime., Disp: , Rfl:    hydrocortisone (ANUSOL-HC) 2.5 % rectal cream, Place 1 Application rectally 2 (two) times daily. (Patient taking differently: Place 1 Application rectally 2 (two) times daily as needed for hemorrhoids (or pain).), Disp: 30 g, Rfl: 0   ipratropium-albuterol (DUONEB) 0.5-2.5 (3) MG/3ML SOLN, Take 3 mLs by nebulization every 6 (six) hours as needed. (Patient taking differently: Take 3 mLs by nebulization every 6 (six) hours as needed (for wheezing or shortness of breath).), Disp: 360 mL, Rfl: 0   lisinopril (ZESTRIL) 2.5 MG tablet, Take 1 tablet (2.5 mg total) by mouth daily., Disp: 30 tablet, Rfl: 2   lovastatin (MEVACOR) 40 MG tablet, TAKE 1 TABLET(40 MG) BY MOUTH AT BEDTIME (Patient taking differently: Take 40 mg by mouth daily.), Disp: 90 tablet, Rfl: 0   montelukast (SINGULAIR) 10 MG tablet, TAKE 1 TABLET(10 MG) BY MOUTH AT BEDTIME (Patient taking differently: Take 10 mg by mouth daily.), Disp: 90 tablet, Rfl: 1   Multiple Vitamins-Minerals (PRESERVISION AREDS 2 PO), Take 2 capsules by mouth in the morning., Disp: , Rfl:    ondansetron (ZOFRAN-ODT) 4 MG disintegrating tablet, Take 1 tablet (4 mg total) by mouth every 8 (eight) hours as needed for nausea or vomiting. (Patient taking differently: Take 4 mg by mouth every 8 (eight) hours as needed for nausea or vomiting (DISSOLVE ORALLY).), Disp: 20 tablet, Rfl: 1    Phenazopyrid-Cranbry-C-Probiot (AZO URINARY TRACT SUPPORT PO), Take 1 capsule by mouth daily., Disp: , Rfl:    polyethylene glycol (MIRALAX / GLYCOLAX) packet, Take 17 g by mouth daily. (Patient taking differently: Take 17 g by mouth daily as needed for mild constipation.), Disp: 14 each, Rfl: 0   potassium chloride (KLOR-CON) 10 MEQ tablet, TAKE 2 TABLETS(20 MEQ) BY MOUTH DAILY (Patient taking differently: Take 20 mEq by mouth in the morning.), Disp: 180 tablet, Rfl: 1   psyllium (REGULOID) 0.52 g capsule, Take 0.52 g by mouth in the morning and at bedtime., Disp: , Rfl:    traMADol (ULTRAM) 50 MG tablet, Take 1 tablet (50 mg total) by mouth 2 (two) times daily as needed for moderate pain (pain score 4-6)., Disp: 60 tablet, Rfl: 0   traZODone (DESYREL) 50 MG tablet, TAKE 1/2 TO 1 TABLET(25 TO 50 MG) BY MOUTH AT BEDTIME (Patient taking differently: Take 50 mg by mouth at bedtime.), Disp: 30 tablet, Rfl: 6   umeclidinium bromide (INCRUSE ELLIPTA) 62.5 MCG/ACT AEPB, Inhale 1 puff into the lungs daily., Disp: , Rfl:    Vibegron (GEMTESA) 75 MG TABS, Take 75 mg by mouth daily., Disp: , Rfl:    vitamin B-12 (CYANOCOBALAMIN) 500 MCG tablet, Take 1 tablet (500 mcg total) by mouth 2 (two) times a week., Disp: , Rfl:    Zinc  50 MG CAPS, Take 1 capsule (50 mg total) by mouth daily., Disp: , Rfl: 0   Fluticasone-Umeclidin-Vilant (TRELEGY ELLIPTA) 200-62.5-25 MCG/ACT AEPB, Inhale 1 puff into the lungs daily. (Patient not taking: Reported on 09/04/2023), Disp: 60 each, Rfl: 11 No current facility-administered medications for this visit.  Facility-Administered Medications Ordered in Other Visits:    ondansetron (ZOFRAN) 4 mg in sodium chloride 0.9 % 50 mL IVPB, 4 mg, Intravenous, Q6H PRN, Barnett Abu, MD      Objective:   Vitals:   09/04/23 0829  BP: 126/80  Pulse: 87  Temp: 98.4 F (36.9 C)  TempSrc: Oral  SpO2: 94%  Weight: 145 lb 12.8 oz (66.1 kg)  Height: 5\' 4"  (1.626 m)    Estimated body  mass index is 25.03 kg/m as calculated from the following:   Height as of this encounter: 5\' 4"  (1.626 m).   Weight as of this encounter: 145 lb 12.8 oz (66.1 kg).  @WEIGHTCHANGE @  [employer]   09/04/23 0829  Weight: 145 lb 12.8 oz (66.1 kg)     Physical Exam   General: No distress. Looks frail like befoere O2 at rest: no Cane present: no Sitting in wheel chair: no Frail: YES Obese: no Neuro: Alert and Oriented x 3. GCS 15. Speech normal Psych: Pleasant Resp:  Barrel Chest - no.  Wheeze - no, Crackles - no, No overt respiratory distress CVS: Normal heart sounds. Murmurs - no Ext: Stigmata of Connective Tissue Disease - no HEENT: Normal upper airway. PEERL +. No post nasal drip        Assessment:       ICD-10-CM   1. Stage 3 severe COPD by GOLD classification (HCC)  J44.9     2. Multiple lung nodules on CT  R91.8     3. Pseudomonas aeruginosa colonization  Z22.39     4. Frailty  R54     5. Physical deconditioning  R53.81     6. Hospital discharge follow-up  Z09          Plan:     Patient Instructions  Stage 3 severe COPD by GOLD classification (HCC) Multiple lung nodules on CT Pseudomonas aeruginosa colonization   -Lung function has deteriorated in the last 5 years.  You now with severe COPD classification.  I suspect this because of the nodules and aging but at this point in time investigating the nodules adds risks.  Given the recent hospitalization I do do not recommend bronchoscopy at this point in time  Plan  - Hold off bronchoscopy plans given recent hospitalization and risk versus benefit ratio - Continue Trelegy scheduled - Continue albuterol as needed - Start OTHUVYARE nebulizer twice a day  -Monitor for side effects including neuropsychiatric side effects.  Frailty Physical deconditioning Hospital discharge follow-up   Plan  - refer home PT  Follow-up - 3 months with nurse practitioner or Dr. Sherrin Daisy Return in  about 3 months (around 12/05/2023) for 15 min visit, Chronic Respiratory Failure, with Dr Marchelle Gearing, Face to Face Visit.    SIGNATURE    Dr. [name], [name], F.C.C.P,  Pulmonary and Critical Care Medicine Staff Physician, [facility] Center Director - Interstitial Lung Disease  Program  Pulmonary Fibrosis [facility] at [facility] [place], [place], [address]  Pager: [phone], If no answer or between  15:00h - 7:00h: call [phone] Telephone: [phone]  9:06 AM 09/04/2023

## 2023-09-04 NOTE — Patient Instructions (Signed)
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  You now with severe COPD classification.  I suspect this because of the nodules and aging but at this point in time investigating the nodules adds risks.  Given the recent hospitalization I do do not recommend bronchoscopy at this point in time  Plan  - Hold off bronchoscopy plans given recent hospitalization and risk versus benefit ratio - Continue Trelegy scheduled - Continue albuterol as needed - Start OTHUVYARE nebulizer twice a day  -Monitor for side effects including neuropsychiatric side effects.  Frailty Physical deconditioning Hospital discharge follow-up   Plan  - refer home PT  Follow-up - 3 months with nurse practitioner or Dr. Marchelle Gearing

## 2023-09-05 ENCOUNTER — Ambulatory Visit: Payer: Medicare Other | Admitting: Physician Assistant

## 2023-09-05 ENCOUNTER — Encounter: Payer: Self-pay | Admitting: Adult Health

## 2023-09-05 ENCOUNTER — Encounter: Payer: Self-pay | Admitting: Family Medicine

## 2023-09-05 ENCOUNTER — Ambulatory Visit (INDEPENDENT_AMBULATORY_CARE_PROVIDER_SITE_OTHER): Payer: Medicare Other | Admitting: Family Medicine

## 2023-09-05 ENCOUNTER — Ambulatory Visit: Payer: Medicare Other | Attending: Adult Health | Admitting: Adult Health

## 2023-09-05 VITALS — BP 140/74 | HR 96 | Ht 64.0 in | Wt 145.2 lb

## 2023-09-05 VITALS — BP 134/82 | HR 76 | Temp 98.6°F | Ht 64.0 in | Wt 146.2 lb

## 2023-09-05 DIAGNOSIS — I83019 Varicose veins of right lower extremity with ulcer of unspecified site: Secondary | ICD-10-CM

## 2023-09-05 DIAGNOSIS — I480 Paroxysmal atrial fibrillation: Secondary | ICD-10-CM

## 2023-09-05 DIAGNOSIS — J441 Chronic obstructive pulmonary disease with (acute) exacerbation: Secondary | ICD-10-CM

## 2023-09-05 DIAGNOSIS — I4892 Unspecified atrial flutter: Secondary | ICD-10-CM

## 2023-09-05 DIAGNOSIS — I5033 Acute on chronic diastolic (congestive) heart failure: Secondary | ICD-10-CM

## 2023-09-05 DIAGNOSIS — Z953 Presence of xenogenic heart valve: Secondary | ICD-10-CM

## 2023-09-05 DIAGNOSIS — R0609 Other forms of dyspnea: Secondary | ICD-10-CM

## 2023-09-05 DIAGNOSIS — Z950 Presence of cardiac pacemaker: Secondary | ICD-10-CM

## 2023-09-05 DIAGNOSIS — E7800 Pure hypercholesterolemia, unspecified: Secondary | ICD-10-CM

## 2023-09-05 DIAGNOSIS — I495 Sick sinus syndrome: Secondary | ICD-10-CM

## 2023-09-05 DIAGNOSIS — L97919 Non-pressure chronic ulcer of unspecified part of right lower leg with unspecified severity: Secondary | ICD-10-CM

## 2023-09-05 DIAGNOSIS — Z2239 Carrier of other specified bacterial diseases: Secondary | ICD-10-CM

## 2023-09-05 DIAGNOSIS — D539 Nutritional anemia, unspecified: Secondary | ICD-10-CM

## 2023-09-05 DIAGNOSIS — I5032 Chronic diastolic (congestive) heart failure: Secondary | ICD-10-CM | POA: Diagnosis not present

## 2023-09-05 DIAGNOSIS — L97929 Non-pressure chronic ulcer of unspecified part of left lower leg with unspecified severity: Secondary | ICD-10-CM

## 2023-09-05 DIAGNOSIS — R2689 Other abnormalities of gait and mobility: Secondary | ICD-10-CM

## 2023-09-05 DIAGNOSIS — I10 Essential (primary) hypertension: Secondary | ICD-10-CM | POA: Diagnosis not present

## 2023-09-05 DIAGNOSIS — L89600 Pressure ulcer of unspecified heel, unstageable: Secondary | ICD-10-CM

## 2023-09-05 DIAGNOSIS — J449 Chronic obstructive pulmonary disease, unspecified: Secondary | ICD-10-CM

## 2023-09-05 DIAGNOSIS — I83029 Varicose veins of left lower extremity with ulcer of unspecified site: Secondary | ICD-10-CM

## 2023-09-05 DIAGNOSIS — Z7901 Long term (current) use of anticoagulants: Secondary | ICD-10-CM

## 2023-09-05 NOTE — Progress Notes (Unsigned)
Ph: [phone] Fax: [phone]   Patient ID: [name], female    DOB: 1946-04-17, 77 y.o.   MRN: [id number]  This visit was conducted in person.  BP 134/82   Pulse 76   Temp 98.6 F (37 C) (Oral)   Ht 5\' 4"  (1.626 m)   Wt 146 lb 4 oz (66.3 kg)   SpO2 93%   BMI 25.10 kg/m    CC: hosp f/u visit  Subjective:   HPI: [name] is a 77 y.o. female presenting on 09/05/2023 for Hospitalization Follow-up (Admitted 08/27/23 at [facility], dx dyspnea. Pt accompanied by granddaughter, Leela. )   Recent hospitalization for acute dCHF exacerbation and COPD exacerbation presenting with dyspnea. CHF flare likely brought on by afib/aflutter. Treated with diuresis, IV steroids and breathing treatments with benefit. Did not receive antibiotics in hospital. Underwent TEE with cardioversion prior to discharge. EF at that time >75%.  Hospital records reviewed. Med rec performed.  Initial BNP 293.9.  Found to have pleural effusions s/p lasix diuresis.  Lisinopril 2.5mg  was started.   Since home has been feeling better, dyspnea improving. O2 has improved. Still with chronic exertional dyspnea.   She had bone marrow biopsy 2 wks ago - has f/u appt tomorrow.  She had 2 cystoscopies for R ureter stone with hydronephrosis in 06/2023 and again 07/2023. Had temporary ureteral stent placed then subsequently removed. Next urology appt 09/19/2023.   Home health not set up.  She is going to outpatient PT for balance - doesn't feel this has helped.  Wound clinic - seen for venous insufficiency ulcers and heel pressure ulcers. Has started wearing compression stockings. Planned f/u one more time.   Other follow up appointments scheduled:  Saw pulm Dr Marchelle Gearing yesterday - rec hold bronchoscopy due to fragility, trial Ohtuvyare neb BID, continue trelegy scheduled and albuterol PRN for severe COPD.  Saw CHF Cards Joni Reining this morning.  EP Cardiology Canary Brim upcoming appt  09/11/2023.  ______________________________________________________________________ Hospital admission: 08/27/2023 Hospital discharge: 08/30/2023 TCM f/u phone call: not performed   Recommendations at discharge:  PCP visit for hospital follow-up Cardiology/EP visit for atrial fibrillation/flutter   Discharge Diagnoses: Principal Problem:   Acute heart failure with preserved ejection fraction (HFpEF) (HCC) Active Problems:   HLD (hyperlipidemia)   Essential hypertension, benign   COPD (chronic obstructive pulmonary disease)   GERD (gastroesophageal reflux disease)   Mitral valve replaced   Vitamin B12 deficiency   Atrial flutter (HCC)   Thrombocytopenia   Persistent atrial fibrillation   Peripheral neuropathy   Acute exacerbation of chronic obstructive pulmonary disease (COPD) (HCC)  Consultants:  Cardiology   Procedures performed:  11/18: Transthoracic Echocardiogram 11/19: Transesophageal Echocardiogram with cardioversion     Relevant past medical, surgical, family and social history reviewed and updated as indicated. Interim medical history since our last visit reviewed. Allergies and medications reviewed and updated. Outpatient Medications Prior to Visit  Medication Sig Dispense Refill   acetaminophen (TYLENOL) 500 MG tablet Take 2 tablets (1,000 mg total) by mouth daily. (Patient taking differently: Take 1,000 mg by mouth in the morning.)     acidophilus (RISAQUAD) CAPS capsule Take 1 capsule by mouth daily.     albuterol (VENTOLIN HFA) 108 (90 Base) MCG/ACT inhaler Inhale 2 puffs into the lungs every 6 (six) hours as needed for wheezing. 18 g 6   alendronate (FOSAMAX) 70 MG tablet Take 1 tablet (70 mg total) by mouth every 7 (seven) days. Take with a full  glass of water on an empty stomach. 4 tablet 11   ascorbic acid (VITAMIN C) 500 MG tablet Take 1 tablet (500 mg total) by mouth daily.     Biotin 1000 MCG tablet Take 1,000 mcg by mouth daily.     buPROPion  (WELLBUTRIN SR) 150 MG 12 hr tablet TAKE 1 TABLET(150 MG) BY MOUTH EVERY MORNING (Patient taking differently: Take 150 mg by mouth in the morning.) 90 tablet 1   calcium carbonate (OSCAL) 1500 (600 Ca) MG TABS tablet Take 600 mg of elemental calcium by mouth daily.     carboxymethylcellulose (REFRESH PLUS) 0.5 % SOLN Place 1 drop into both eyes 3 (three) times daily as needed (dry eyes).     cephALEXin (KEFLEX) 250 MG capsule Take 250 mg by mouth daily with breakfast.     cholecalciferol (VITAMIN D3) 25 MCG (1000 UNIT) tablet Take 1,000 Units by mouth daily.     docusate sodium (COLACE) 100 MG capsule Take 200 mg by mouth daily.     ELIQUIS 5 MG TABS tablet TAKE 1 TABLET(5 MG) BY MOUTH TWICE DAILY (Patient taking differently: Take 5 mg by mouth in the morning and at bedtime.) 180 tablet 1   Fluticasone-Umeclidin-Vilant (TRELEGY ELLIPTA) 200-62.5-25 MCG/ACT AEPB Inhale 1 puff into the lungs daily. 60 each 11   furosemide (LASIX) 40 MG tablet TAKE 1 TABLET BY MOUTH EVERY DAY. MAY TAKE AN EXTRA TABLET AS NEEDED FOR SWELLING (Patient taking differently: Take 40 mg by mouth in the morning.) 135 tablet 2   gabapentin (NEURONTIN) 100 MG capsule Take 1 capsule (100 mg total) by mouth 2 (two) times daily as needed (back pain - for daytime use). (Patient taking differently: Take 100 mg by mouth daily.) 60 capsule 1   gabapentin (NEURONTIN) 300 MG capsule Take 300 mg by mouth at bedtime.     hydrocortisone (ANUSOL-HC) 2.5 % rectal cream Place 1 Application rectally 2 (two) times daily. (Patient taking differently: Place 1 Application rectally 2 (two) times daily as needed for hemorrhoids (or pain).) 30 g 0   ipratropium-albuterol (DUONEB) 0.5-2.5 (3) MG/3ML SOLN Take 3 mLs by nebulization every 6 (six) hours as needed. (Patient taking differently: Take 3 mLs by nebulization every 6 (six) hours as needed (for wheezing or shortness of breath).) 360 mL 0   lisinopril (ZESTRIL) 2.5 MG tablet Take 1 tablet (2.5 mg  total) by mouth daily. 30 tablet 2   lovastatin (MEVACOR) 40 MG tablet TAKE 1 TABLET(40 MG) BY MOUTH AT BEDTIME (Patient taking differently: Take 40 mg by mouth daily.) 90 tablet 0   montelukast (SINGULAIR) 10 MG tablet TAKE 1 TABLET(10 MG) BY MOUTH AT BEDTIME (Patient taking differently: Take 10 mg by mouth daily.) 90 tablet 1   Multiple Vitamins-Minerals (PRESERVISION AREDS 2 PO) Take 2 capsules by mouth in the morning.     ondansetron (ZOFRAN-ODT) 4 MG disintegrating tablet Take 1 tablet (4 mg total) by mouth every 8 (eight) hours as needed for nausea or vomiting. (Patient taking differently: Take 4 mg by mouth every 8 (eight) hours as needed for nausea or vomiting (DISSOLVE ORALLY).) 20 tablet 1   Phenazopyrid-Cranbry-C-Probiot (AZO URINARY TRACT SUPPORT PO) Take 1 capsule by mouth daily.     polyethylene glycol (MIRALAX / GLYCOLAX) packet Take 17 g by mouth daily. (Patient taking differently: Take 17 g by mouth daily as needed for mild constipation.) 14 each 0   potassium chloride (KLOR-CON) 10 MEQ tablet TAKE 2 TABLETS(20 MEQ) BY MOUTH DAILY (Patient taking differently:  Take 20 mEq by mouth in the morning.) 180 tablet 1   psyllium (REGULOID) 0.52 g capsule Take 0.52 g by mouth in the morning and at bedtime.     traMADol (ULTRAM) 50 MG tablet Take 1 tablet (50 mg total) by mouth 2 (two) times daily as needed for moderate pain (pain score 4-6). 60 tablet 0   traZODone (DESYREL) 50 MG tablet TAKE 1/2 TO 1 TABLET(25 TO 50 MG) BY MOUTH AT BEDTIME (Patient taking differently: Take 50 mg by mouth at bedtime.) 30 tablet 6   Vibegron (GEMTESA) 75 MG TABS Take 75 mg by mouth daily.     vitamin B-12 (CYANOCOBALAMIN) 500 MCG tablet Take 1 tablet (500 mcg total) by mouth 2 (two) times a week.     Zinc 50 MG CAPS Take 1 capsule (50 mg total) by mouth daily.  0   fluticasone furoate-vilanterol (BREO ELLIPTA) 200-25 MCG/ACT AEPB Inhale 1 puff into the lungs daily.     umeclidinium bromide (INCRUSE ELLIPTA) 62.5  MCG/ACT AEPB Inhale 1 puff into the lungs daily. (Patient not taking: Reported on 09/05/2023)     ondansetron (ZOFRAN) 4 mg in sodium chloride 0.9 % 50 mL IVPB      No facility-administered medications prior to visit.     Per HPI unless specifically indicated in ROS section below Review of Systems  Objective:  BP 134/82   Pulse 76   Temp 98.6 F (37 C) (Oral)   Ht 5\' 4"  (1.626 m)   Wt 146 lb 4 oz (66.3 kg)   SpO2 93%   BMI 25.10 kg/m   Wt Readings from Last 3 Encounters:  09/05/23 146 lb 4 oz (66.3 kg)  09/05/23 145 lb 3.2 oz (65.9 kg)  09/04/23 145 lb 12.8 oz (66.1 kg)      Physical Exam Vitals and nursing note reviewed.  Constitutional:      Appearance: Normal appearance. She is not ill-appearing.  HENT:     Mouth/Throat:     Mouth: Mucous membranes are moist.     Pharynx: Oropharynx is clear. No oropharyngeal exudate or posterior oropharyngeal erythema.  Eyes:     Extraocular Movements: Extraocular movements intact.     Pupils: Pupils are equal, round, and reactive to light.  Cardiovascular:     Rate and Rhythm: Normal rate and regular rhythm.     Pulses: Normal pulses.     Heart sounds: Murmur (3/6 systolic throughout) heard.  Pulmonary:     Effort: Pulmonary effort is normal. No respiratory distress.     Breath sounds: Rhonchi (bibasilar) present. No wheezing or rales.     Comments: Coarse especially at RLL Musculoskeletal:     Right lower leg: No edema.     Left lower leg: No edema.  Skin:    General: Skin is warm and dry.  Neurological:     Mental Status: She is alert.  Psychiatric:        Mood and Affect: Mood normal.        Behavior: Behavior normal.       Results for orders placed or performed in visit on 09/01/23  Pulmonary function test  Result Value Ref Range   FVC-Pre 1.45 L   FVC-%Pred-Pre 53 %   FEV1-Pre 1.00 L   FEV1-%Pred-Pre 49 %   FEV6-Pre 1.44 L   FEV6-%Pred-Pre 56 %   Pre FEV1/FVC ratio 69 %   FEV1FVC-%Pred-Pre 92 %   Pre  FEV6/FVC Ratio 100 %   FEV6FVC-%Pred-Pre 105 %  FEF 25-75 Pre 0.56 L/sec   FEF2575-%Pred-Pre 37 %   DLCO unc 13.37 ml/min/mmHg   DLCO unc % pred 70 %   DLCO cor 14.70 ml/min/mmHg   DLCO cor % pred 77 %   DL/VA 9.60 ml/min/mmHg/L   DL/VA % pred 454 %   *Note: Due to a large number of results and/or encounters for the requested time period, some results have not been displayed. A complete set of results can be found in Results Review.    Assessment & Plan:   Problem List Items Addressed This Visit     COPD (chronic obstructive pulmonary disease)    Saw pulm for severe COPD.  Not oxygen dependent.  Continue Trelegy with albuterol PRN. Also on singulair.  Recent start of OHTUVAYRE (ensifentrine) PDE3/4 enzyme inhibitor      Macrocytic anemia    Has seen hematology s/p bone marrow biopsy, f/u appt scheduled tomorrow.       Acute on chronic heart failure with preserved ejection fraction (HFpEF, >= 50%) (HCC) - Primary    Treated with duiresis, continues furosemide 40mg  daily with potassium daily, just started lisinopril 2.5mg  daily. Breathing seems back to baseline       COPD exacerbation (HCC)    Treated with duoneb treatment and prednisone taper, has completed. Breathing is back to baseline       Dyspnea on exertion   Atrial flutter (HCC)    Episode of afib/aflutter likely exacerbated dCHF.  S/p TEE cardioversion.  Continues eliquis.       Anticoagulant long-term use   Pacemaker   Pseudomonas aeruginosa colonization   Venous ulcer of leg (HCC)    Now regularly using compression stockings Appreciate wound clinic care      Imbalance    She doesn't feel outpatient PT has been beneficial but she will finish course.       Pressure sore on heel    Seeing wound clinic for this.         No orders of the defined types were placed in this encounter.   No orders of the defined types were placed in this encounter.   Patient Instructions  Good to see you  today Continue current medicines.  Keep follow up with urology and electrophysiology.  Reschedule next month's appointment to January or February  Follow up plan: Return if symptoms worsen or fail to improve.  [name], MD

## 2023-09-05 NOTE — Patient Instructions (Signed)
Medication Instructions:  No Changes *If you need a refill on your cardiac medications before your next appointment, please call your pharmacy*   Lab Work: No labs If you have labs (blood work) drawn today and your tests are completely normal, you will receive your results only by: MyChart Message (if you have MyChart) OR A paper copy in the mail If you have any lab test that is abnormal or we need to change your treatment, we will call you to review the results.   Testing/Procedures: No Testing   Follow-Up: At [facility], you and your health needs are our priority.  As part of our continuing mission to provide you with exceptional heart care, we have created designated Provider Care Teams.  These Care Teams include your primary Cardiologist (physician) and Advanced Practice Providers (APPs -  Physician Assistants and Nurse Practitioners) who all work together to provide you with the care you need, when you need it.  We recommend signing up for the patient portal called "MyChart".  Sign up information is provided on this After Visit Summary.  MyChart is used to connect with patients for Virtual Visits (Telemedicine).  Patients are able to view lab/test results, encounter notes, upcoming appointments, etc.  Non-urgent messages can be sent to your provider as well.   To learn more about what you can do with MyChart, go to [email or web address].    Your next appointment:   6 month(s)  Provider:   Thurmon Fair, MD

## 2023-09-05 NOTE — Patient Instructions (Addendum)
Good to see you today Continue current medicines.  Keep follow up with urology and electrophysiology.  Reschedule next month's appointment to January or February

## 2023-09-06 ENCOUNTER — Encounter: Payer: Self-pay | Admitting: Family Medicine

## 2023-09-06 ENCOUNTER — Encounter: Payer: Self-pay | Admitting: *Deleted

## 2023-09-06 ENCOUNTER — Inpatient Hospital Stay (HOSPITAL_BASED_OUTPATIENT_CLINIC_OR_DEPARTMENT_OTHER): Payer: Medicare Other | Admitting: Oncology

## 2023-09-06 ENCOUNTER — Ambulatory Visit: Payer: Medicare Other | Admitting: Adult Health

## 2023-09-06 ENCOUNTER — Inpatient Hospital Stay: Payer: Medicare Other

## 2023-09-06 VITALS — BP 140/64 | HR 81 | Temp 98.1°F | Resp 18 | Ht 64.0 in | Wt 144.6 lb

## 2023-09-06 DIAGNOSIS — Z85048 Personal history of other malignant neoplasm of rectum, rectosigmoid junction, and anus: Secondary | ICD-10-CM | POA: Diagnosis not present

## 2023-09-06 DIAGNOSIS — D539 Nutritional anemia, unspecified: Secondary | ICD-10-CM

## 2023-09-06 DIAGNOSIS — D696 Thrombocytopenia, unspecified: Secondary | ICD-10-CM | POA: Diagnosis not present

## 2023-09-06 DIAGNOSIS — D649 Anemia, unspecified: Secondary | ICD-10-CM | POA: Diagnosis present

## 2023-09-06 LAB — CBC WITH DIFFERENTIAL (CANCER CENTER ONLY)
Abs Immature Granulocytes: 0.03 10*3/uL (ref 0.00–0.07)
Basophils Absolute: 0.1 10*3/uL (ref 0.0–0.1)
Basophils Relative: 1 %
Eosinophils Absolute: 0.3 10*3/uL (ref 0.0–0.5)
Eosinophils Relative: 3 %
HCT: 37.6 % (ref 36.0–46.0)
Hemoglobin: 11.9 g/dL — ABNORMAL LOW (ref 12.0–15.0)
Immature Granulocytes: 0 %
Lymphocytes Relative: 5 %
Lymphs Abs: 0.4 10*3/uL — ABNORMAL LOW (ref 0.7–4.0)
MCH: 31.4 pg (ref 26.0–34.0)
MCHC: 31.6 g/dL (ref 30.0–36.0)
MCV: 99.2 fL (ref 80.0–100.0)
Monocytes Absolute: 0.8 10*3/uL (ref 0.1–1.0)
Monocytes Relative: 10 %
Neutro Abs: 6.1 10*3/uL (ref 1.7–7.7)
Neutrophils Relative %: 81 %
Platelet Count: 98 10*3/uL — ABNORMAL LOW (ref 150–400)
RBC: 3.79 MIL/uL — ABNORMAL LOW (ref 3.87–5.11)
RDW: 14.4 % (ref 11.5–15.5)
WBC Count: 7.6 10*3/uL (ref 4.0–10.5)
nRBC: 0 % (ref 0.0–0.2)

## 2023-09-06 LAB — DIRECT ANTIGLOBULIN TEST (NOT AT ARMC)
DAT, IgG: NEGATIVE
DAT, complement: NEGATIVE

## 2023-09-06 MED ORDER — FERROUS SULFATE 325 (65 FE) MG PO TBEC: 325.0000 mg | DELAYED_RELEASE_TABLET | Freq: Every day | ORAL | Status: DC

## 2023-09-06 NOTE — Progress Notes (Signed)
Email to [facility] Pathology at MD request: Needs MDS FISH on case #[id number] dated 08/23/23.

## 2023-09-06 NOTE — Assessment & Plan Note (Signed)
Has seen hematology s/p bone marrow biopsy, f/u appt scheduled tomorrow.

## 2023-09-06 NOTE — Assessment & Plan Note (Signed)
Saw pulm for severe COPD.  Not oxygen dependent.  Continue Trelegy with albuterol PRN. Also on singulair.  Recent start of OHTUVAYRE (ensifentrine) PDE3/4 enzyme inhibitor

## 2023-09-06 NOTE — Patient Instructions (Signed)
Iron Capsules or Tablets (Supplement) What is this medication? IRON (EYE ern) prevents and treats low levels of iron in your body. Iron is a mineral that plays an important role in making red blood cells, which carry oxygen from your lungs to the rest of your body. This medicine may be used for other purposes; ask your health care provider or pharmacist if you have questions. COMMON BRAND NAME(S): Crissie Figures Ferrous Sulfate, Bifera, Duofer, EZFE, Feosol, Feosol Complete, [employer], [place], [place], [place], [place], Filer 150, Ferrex-150, Ferric X-150, Ferrimin, Ferro-Sequels, Ferrocite, [place], [place], iFerex 150, Nephro-Fer, Niferex, NovaFerrum, Nu-Iron, Poly-Iron, ProFe, Proferrin ES, Slow Fe, Slow Iron, Tandem What should I tell my care team before I take this medication? They need to know if you have any of these conditions: Bowel disease Frequently drink alcohol Hemolytic anemia Iron overload (hemochromatosis, hemosiderosis) Liver disease Problems with swallowing Stomach ulcer or other stomach problems An unusual or allergic reaction to iron, other medications, foods, dyes, or preservatives Pregnant or trying to get pregnant Breastfeeding How should I use this medication? Take this medication by mouth with a glass of water or fruit juice. Take it as directed on the prescription label. Do not cut, crush, or chew this medication. Swallow tablets or capsules whole. Take this medication in an upright or sitting position. Try to take any bedtime doses at least 10 minutes before lying down. You may take this medication with food. Keep taking it unless your care team tells you to stop. Talk to your care team about the use of this medication in children. While it may be prescribed for selected conditions, precautions do apply. Overdosage: If you think you have taken too much of this medicine contact a poison control center or emergency room at once. NOTE: This medicine is  only for you. Do not share this medicine with others. What if I miss a dose? If you miss a dose, take it as soon as you can. If it is almost time for your next dose, take only that dose. Do not take double or extra doses. What may interact with this medication? If you are taking this iron product, you should not take iron in any other medication or dietary supplement. This medication may also interact with the following: Alendronate Antacids Cefdinir Chloramphenicol Cholestyramine Deferoxamine Dimercaprol Etidronate Medications for stomach ulcers or other stomach problems Pancreatic enzymes Quinolone antibiotics (examples: Cipro, Floxin, Levaquin, Tequin and others) Risedronate Tetracycline antibiotics (examples: doxycycline, tetracycline, minocycline, and others) Thyroid hormones This list may not describe all possible interactions. Give your health care provider a list of all the medicines, herbs, non-prescription drugs, or dietary supplements you use. Also tell them if you smoke, drink alcohol, or use illegal drugs. Some items may interact with your medicine. What should I watch for while using this medication? Use iron supplements only as directed by your care team. You will need important blood work while you are taking this medication. It may take 3 to 6 months of therapy to treat low iron levels. Pregnant women should follow the dose and length of iron treatment as directed by their care team. Do not use iron longer than prescribed, and do not take a higher dose than recommended. Long-term use may cause excess iron to build-up in the body. Do not take iron with antacids. If you need to take an antacid, take it 2 hours after a dose of iron. What side effects may I notice from receiving this medication? Side effects that you should report  to your care team as soon as possible: Allergic reactions--skin rash, itching, hives, swelling of the face, lips, tongue, or throat Side effects  that usually do not require medical attention (report to your care team if they continue or are bothersome): Constipation Dark stools Metallic taste in mouth Nausea Upset stomach This list may not describe all possible side effects. Call your doctor for medical advice about side effects. You may report side effects to FDA at 1-800-FDA-1088. Where should I keep my medication? Keep out of the reach of children and pets. Store at room temperature between 20 and 25 degrees C (68 and 77 degrees F). Keep container tightly closed. Get rid of any unused medication after the expiration date. To get rid of medications that are no longer wanted or have expired: Take the medication to a medication take-back program. Check with your pharmacy or law enforcement to find a location. If you cannot return the medication, check the label or package insert to see if the medication should be thrown out in the garbage or flushed down the toilet. If you are not sure, ask your care team. If it is safe to put it in the trash, empty the medication out of the container. Mix the medication with cat litter, dirt, coffee grounds, or other unwanted substance. Seal the mixture in a bag or container. Put it in the trash. NOTE: This sheet is a summary. It may not cover all possible information. If you have questions about this medicine, talk to your doctor, pharmacist, or health care provider.  2024 Elsevier/Gold Standard (2023-03-03 00:00:00)

## 2023-09-06 NOTE — Progress Notes (Signed)
  [name] OFFICE PROGRESS NOTE   Diagnosis: Anemia, thrombocytopenia  INTERVAL HISTORY:   [name] returns for a scheduled visit.  She underwent a bone marrow biopsy 08/23/2023.  There were nonspecific changes.  Iron stores were diminished.  Cytogenetics returned normal. She was admitted with heart failure, atrial fibrillation/flutter, and a COPD exacerbation 08/27/2023.  She underwent cardioversion. Objective:  Vital signs in last 24 hours:  Blood pressure (!) 140/64, pulse 81, temperature 98.1 F (36.7 C), temperature source Temporal, resp. rate 18, height 5\' 4"  (1.626 m), weight 144 lb 9.6 oz (65.6 kg), SpO2 94%.     Resp: Distant breath sounds with decreased breath sounds at the right lower chest, no respiratory distress Cardio: Regular rhythm GI: No hepatosplenomegaly Vascular: No leg edema Skin: Brown discoloration with abrasions of the lower leg bilaterally   Lab Results:  Lab Results  Component Value Date   WBC 7.6 09/06/2023   HGB 11.9 (L) 09/06/2023   HCT 37.6 09/06/2023   MCV 99.2 09/06/2023   PLT 98 (L) 09/06/2023   NEUTROABS 6.1 09/06/2023    CMP  Lab Results  Component Value Date   NA 132 (L) 08/30/2023   K 4.4 08/30/2023   CL 97 (L) 08/30/2023   CO2 28 08/30/2023   GLUCOSE 120 (H) 08/30/2023   BUN 26 (H) 08/30/2023   CREATININE 0.84 08/30/2023   CALCIUM 8.9 08/30/2023   PROT 6.4 (L) 08/28/2023   ALBUMIN 3.0 (L) 08/28/2023   AST 16 08/28/2023   ALT 10 08/28/2023   ALKPHOS 62 08/28/2023   BILITOT 0.6 08/28/2023   GFRNONAA >60 08/30/2023   GFRAA 82 05/31/2019    Lab Results  Component Value Date   CEA1 <1.00 08/12/2016   CEA1 1.0 08/12/2016   CEA <0.5 01/14/2015    Medications: I have reviewed the patient's current medications.   Assessment/Plan:  Macrocytic anemia Bone marrow biopsy 08/23/2023-variably cellular marrow, no dyspoiesis or increase in blast cells, decreased storage iron, normal  cytogenetics  Thrombocytopenia  3.  COPD 4.  Aortic valve replacement 2015 5.  Mitral valve replacement 2015 6.  Kidney stone 2024 7.  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  Arrhythmia, pacemaker in place 9.  Chronic venous stasis with lower extremity ulcers 10.  Admission with heart failure, atrial fibrillation/flutter, and COPD exacerbation 08/27/2023: Cardioversion performed for 19 2024    Disposition: [name] appears stable.  She has stable mild anemia and thrombocytopenia.  The thrombocytopenia is chronic.  There is a report of platelet clumping in the past, but I did not see platelet clumping on the blood smear 08/17/2023.  The anemia and mild thrombocytopenia could be related to undiagnosed chronic liver disease or early myelodysplasia.  We will obtain an MDS FISH panel on the recent bone marrow biopsy.  She will begin iron once daily.  She will return for an office and lab visit in 4 months.  She will call for bleeding.  Diane Papas, MD  09/06/2023  10:28 AM

## 2023-09-06 NOTE — Assessment & Plan Note (Signed)
Treated with duoneb treatment and prednisone taper, has completed. Breathing is back to baseline

## 2023-09-06 NOTE — Assessment & Plan Note (Signed)
She doesn't feel outpatient PT has been beneficial but she will finish course.

## 2023-09-06 NOTE — Assessment & Plan Note (Signed)
Episode of afib/aflutter likely exacerbated dCHF.  S/p TEE cardioversion.  Continues eliquis.

## 2023-09-06 NOTE — Assessment & Plan Note (Signed)
Now regularly using compression stockings Appreciate wound clinic care

## 2023-09-06 NOTE — Assessment & Plan Note (Signed)
Treated with duiresis, continues furosemide 40mg  daily with potassium daily, just started lisinopril 2.5mg  daily. Breathing seems back to baseline

## 2023-09-06 NOTE — Assessment & Plan Note (Signed)
Seeing wound clinic for this.

## 2023-09-10 NOTE — Progress Notes (Unsigned)
Electrophysiology Office Note:   Date:  09/11/2023  ID:  [name], [name] Sep 06, 1946, MRN [id number]  Primary Cardiologist: Thurmon Fair, MD Electrophysiologist: Dr. Royann Shivers      History of Present Illness:   [name] is a 77 y.o. 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   Last seen in EP Clinic 04/2019 by Dr. Elberta Fortis, follows with Dr. Royann Shivers. Most recent remote interrogation 07/2023 shows the patient is dependent, good batery, favorable histograms, stable leads, no AF.   Admit 11/17-11/20/24 for acute on chronic dCHF requiring diuresis, COPD management and DCCV on 08/29/23 for persistent atrial fibrillation / flutter. Discharge wt 149lbs (down 5lbs after diuresis). She has been participating with home health rehab.   Since last being seen in our clinic the patient reports she has been doing ok. Feels her weight has been stable. Recounts she had saturations in the 70's when she went to there ER last admission.  Is curious if she has had AF since the visit. She feels winded with exertion.   She denies chest pain, palpitations, dyspnea, PND, orthopnea, nausea, vomiting, dizziness, syncope, edema, weight gain, or early satiety.   Review of systems complete and found to be negative unless listed in HPI.   EP Information / Studies Reviewed:    EKG is ordered today. Personal review as below.  EKG Interpretation Date/Time:  Monday September 11 2023 09:37:25 EST Ventricular Rate:  85 PR Interval:  154 QRS Duration:  204 QT Interval:  468 QTC Calculation: 556 R Axis:   -83  Text Interpretation: Atrial-sensed ventricular-paced rhythm with occasional atrial-paced complexes Confirmed by Canary Brim ([id number]) on 09/11/2023 10:13:23 AM   PPM Interrogation-  reviewed in detail today,  See PACEART report.  Device  History: Abbott Dual Chamber PPM implanted 06/16/16 for Second Degree AV block  Studies:  Myoview 02/2018 > nuclear stress EF 57%, normal study, low risk study ECHO 08/28/23 > LVEF >75%, no RWMA, mod LV hypertrophy, severely elevated PA systolic pressure, RV systolic pressure 64, LA/RA mildly dilated, prosthetic MV well-seated with no regurgitation / stenosis, mod TV regurgitation, bioprosthetic AV well seated, no stenosis or regurgitation  Arrhythmia / AAD AF / AFL   Risk Assessment/Calculations:    CHA2DS2-VASc Score = 6   This indicates a 9.7% annual risk of stroke. The patient's score is based upon: CHF History: 1 HTN History: 1 Diabetes History: 0 Stroke History: 0 Vascular Disease History: 1 Age Score: 2 Gender Score: 1             Physical Exam:   VS:  BP 130/86 (BP Location: Right Arm, Patient Position: Sitting, Cuff Size: Normal)   Pulse 85   Resp 16   Ht 5\' 4"  (1.626 m)   Wt 146 lb (66.2 kg)   SpO2 93%   BMI 25.06 kg/m    Wt Readings from Last 3 Encounters:  09/11/23 146 lb (66.2 kg)  09/06/23 144 lb 9.6 oz (65.6 kg)  09/05/23 146 lb 4 oz (66.3 kg)     GEN: Well nourished, well developed in no acute distress NECK: No JVD; No carotid bruits CARDIAC: Regular rate and rhythm, no murmurs, rubs, gallops RESPIRATORY:  Clear to auscultation without rales, wheezing or rhonchi  ABDOMEN: Soft, non-tender, non-distended EXTREMITIES:  No edema; No deformity   ASSESSMENT AND PLAN:    Second Degree AV block , Bradycardia s/p  Abbott PPM  -Normal PPM function -See Pace Art report -No changes today  Persistent Atrial Fibrillation  S/p DCCV 08/29/23. Previously intolerant to bisoprolol with orthostasis/syncope.  -EKG ASVP 85bpm  -continue OAC  for stroke prophylaxis  -device does not show AF on interrogation but AT, blocked PAC's seen -start toprol 25mg  at bedtime (LRL on device 70), previously on metoprolol but fell off her list for unclear reasons. Monitor closely  with COPD hx.   Secondary Hypercoagulable State  CHA2DS2-VASc 6 -continue Eliquis 5mg  BID, appropriate by age/wt  Diastolic CHF  -euvolemic on exam  -discharge wt 149 lbs > currently 146 lbs (was 143.7 at home)   Rheumatic Valvular Disease  S/p MV / AV replacement -valves functioning normally on recent ECHO     Disposition:   Follow up with EP APP in 4 weeks to review atrial rates / AT  Signed, Canary Brim, MSN, APRN, NP-C, AGACNP-BC [facility] - Electrophysiology  09/11/2023, 5:07 PM

## 2023-09-11 ENCOUNTER — Ambulatory Visit: Payer: Medicare Other | Attending: Pulmonary Disease | Admitting: Pulmonary Disease

## 2023-09-11 ENCOUNTER — Other Ambulatory Visit: Payer: Self-pay | Admitting: Internal Medicine

## 2023-09-11 ENCOUNTER — Encounter: Payer: Self-pay | Admitting: Pulmonary Disease

## 2023-09-11 VITALS — BP 130/86 | HR 85 | Resp 16 | Ht 64.0 in | Wt 146.0 lb

## 2023-09-11 DIAGNOSIS — I495 Sick sinus syndrome: Secondary | ICD-10-CM | POA: Diagnosis not present

## 2023-09-11 DIAGNOSIS — I5032 Chronic diastolic (congestive) heart failure: Secondary | ICD-10-CM | POA: Diagnosis not present

## 2023-09-11 DIAGNOSIS — Z7901 Long term (current) use of anticoagulants: Secondary | ICD-10-CM

## 2023-09-11 DIAGNOSIS — Z953 Presence of xenogenic heart valve: Secondary | ICD-10-CM

## 2023-09-11 DIAGNOSIS — Z950 Presence of cardiac pacemaker: Secondary | ICD-10-CM

## 2023-09-11 DIAGNOSIS — I480 Paroxysmal atrial fibrillation: Secondary | ICD-10-CM

## 2023-09-11 DIAGNOSIS — I10 Essential (primary) hypertension: Secondary | ICD-10-CM

## 2023-09-11 MED ORDER — METOPROLOL SUCCINATE ER 25 MG PO TB24: 25.0000 mg | ORAL_TABLET | Freq: Every evening | ORAL | 3 refills | Status: DC

## 2023-09-11 NOTE — Patient Instructions (Addendum)
Medication Instructions:  Start metoprolol succinate (Toprol XL) 25 mg every evening *If you need a refill on your cardiac medications before your next appointment, please call your pharmacy*  Lab Work: None ordered If you have labs (blood work) drawn today and your tests are completely normal, you will receive your results only by: MyChart Message (if you have MyChart) OR A paper copy in the mail If you have any lab test that is abnormal or we need to change your treatment, we will call you to review the results.  Testing/Procedures: None  Follow-Up: At [facility], you and your health needs are our priority.  As part of our continuing mission to provide you with exceptional heart care, we have created designated Provider Care Teams.  These Care Teams include your primary Cardiologist (physician) and Advanced Practice Providers (APPs -  Physician Assistants and Nurse Practitioners) who all work together to provide you with the care you need, when you need it.  Your next appointment:   1 month(s)  Provider:   Canary Brim, NP    [name],   You have not had atrial fibrillation since your admit.  However, you have had a few episodes of fast rates from the upper chambers of the heart that are likely atrial tachycardia (different from AFib).  You are protected from stroke with your blood thinner, Eliquis.  We are going to start you on a low dose of Toprol in the evening to hopefully suppress some of these fast rates.  If you have difficulty with the medication, please call our office to let me know.

## 2023-09-12 ENCOUNTER — Encounter: Payer: Self-pay | Admitting: Internal Medicine

## 2023-09-12 ENCOUNTER — Telehealth: Payer: Self-pay | Admitting: Pharmacist

## 2023-09-12 ENCOUNTER — Ambulatory Visit: Payer: Medicare Other | Admitting: Physical Therapy

## 2023-09-12 NOTE — Telephone Encounter (Signed)
  Regarding a chest x-ray result from 09/04/2023.  Expect another 1-2-week delay.  Official report is not in yet

## 2023-09-12 NOTE — Telephone Encounter (Signed)
Received new start paperwork for [facility]. Faxed with clinicals and insurance card copy  Fax: [phone] Phone: [phone]

## 2023-09-14 NOTE — Telephone Encounter (Signed)
Received fax from [facility] [employer] that patient has been enrolled into pathway plus program. [name] rx has been triaged to [facility]  Patient ID: [id number] Phone: [phone] Fax: [phone]  Chesley Mires, PharmD, MPH, BCPS, CPP Clinical Pharmacist (Rheumatology and Pulmonology)

## 2023-09-15 ENCOUNTER — Ambulatory Visit: Payer: Medicare Other | Attending: Family Medicine | Admitting: Physical Therapy

## 2023-09-15 DIAGNOSIS — R269 Unspecified abnormalities of gait and mobility: Secondary | ICD-10-CM | POA: Diagnosis present

## 2023-09-15 DIAGNOSIS — R262 Difficulty in walking, not elsewhere classified: Secondary | ICD-10-CM | POA: Diagnosis present

## 2023-09-15 DIAGNOSIS — M545 Low back pain, unspecified: Secondary | ICD-10-CM | POA: Diagnosis present

## 2023-09-15 DIAGNOSIS — R2689 Other abnormalities of gait and mobility: Secondary | ICD-10-CM | POA: Diagnosis present

## 2023-09-15 DIAGNOSIS — M6281 Muscle weakness (generalized): Secondary | ICD-10-CM

## 2023-09-15 NOTE — Therapy (Signed)
OUTPATIENT PHYSICAL THERAPY NEURO re-Evaluation    Patient Name: [name] MRN: [id number] DOB:04/21/1946, 77 y.o., female Today's Date: 09/15/2023   PCP:   [name], MD   REFERRING PROVIDER:   [name], MD    END OF SESSION:  PT End of Session - 09/15/23 1026     Visit Number 1    Number of Visits 24    Date for PT Re-Evaluation 12/08/23    Progress Note Due on Visit 10    PT Start Time 1020    PT Stop Time 1100    PT Time Calculation (min) 40 min    Equipment Utilized During Treatment Gait belt    Activity Tolerance Patient tolerated treatment well    Behavior During Therapy [facility] for tasks assessed/performed               Past Medical History:  Diagnosis Date   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 Known DISH with thoracic spine fusion and moderate kyphosis, h/o L spine fusion L2-5   Adjustment disorder with depressed mood 2014/10/27   13 yo son died MI 10/05/14 Husband died car accident 04/06/15   Anemia    Anticoagulant long-term use 06/14/2019   She takes warfarin for afib/flutter with CHADS2VASc score of 4 (age, sex, CHF, HTN), s/p 2 bioprosthetic valve replacements Coumadin changed to eliquis 10-05-21 per cardiology   Aortic atherosclerosis 03/03/2022   Noted on 01/2022 chest CT   Arthritis    Arthropathy of spinal facet joint 03/08/2018   Ascending aortic aneurysm 03/02/2021   4.1cm on CT 01/2021 rec yearly monitoring   Bilateral hip pain 09/08/2016   Cancer (HCC)    conon cancer 10/06/11   CAP (community acquired pneumonia) 02/08/2016   Carotid stenosis 12/08/2020   Carotid US - 40-59% BICA stenosis (2015) Carotid US 12/2020 - 1-39% BICA stenosis, incidental 3.4cm structure behind L common carotid artery rec further imaging    Cataracts, bilateral     immature   Chest pain 08/25/2014   myoview low risk 02/2018 ([place])   Chronic cholecystitis with calculus 01/21/2015   S/p cholecystectomy    Chronic diastolic heart failure 09/19/2014   Chronic insomnia    Chronic lower back pain 03/05/2008   Returned to Dr Danielle Dess - translaminar ESI at L3/4. Myelogram showed significant left sided foraminal stenosis at L2/3 and L3/4 in setting of solid arthrodesis, planned DEXA and if stable osteopenia, considering anterolateral decompression with spacer.    CKD (chronic kidney disease) stage 2, GFR 60-89 ml/min 07/03/2015   Constipation    takes Miralax daily as needed   COPD (chronic obstructive pulmonary disease)    Albuterol inhaler daily as needed;Duoneb daily as needed;Spiriva daily   COVID-19 virus infection 11/11/2022   DDD (degenerative disc disease)    cervical - kyphosis with mod DD changes C5/6 and C6/7; lumbar - early DD at L2/3 (Elsner)   Depression    DISH (diffuse idiopathic skeletal hyperostosis) 05/14/2019   By MRI noted fusion of lower thoracic vertebrae - likely contributing to unsteadiness (Elsner)   Dyspnea on exertion 06/05/2015   myoview low risk 02/2018   Edema of both lower legs due to peripheral venous insufficiency    Essential hypertension, benign April 05, 2009   hx of not on nmeds currenlty and has been a while since on meds per  pt   Heart murmur    Hematuria 06/17/2021   History of kidney stones    History of radiation therapy 05/30/11 to 07/07/11   rectum   History of rectal cancer 05/26/2011   Rectal cancer 2012, midrectum ypT3ypN0, s/p neoadj chemo&XRT, lap LAR with diverting loop ileostomy 08/2011, ileostomy takedown 03/2012 Discharged from oncology clinic 2017 Truett Perna)   History of shingles    History of vertebral fracture 11/26/2019   Old L1 vertebral compression fracture incidentally noted on imaging 2020    Hyperlipemia    takes Lovastatin daily   Lacunar infarction    Left caudate   Left hip pain  07/29/2020   Legally blind in left eye    Lymphangioma 01/07/2021   Noted incidentally on carotid US 12/2020 CT neck - Likely benign lymphangioma 01/2021   Macrocytosis 03/27/2022   Folate and b12 checked 2023   Malaise and fatigue 01/22/2018   Meralgia paresthetica 12/13/2007   Mixed stress and urge urinary incontinence 03/16/2015   Failed oxybutynin IR/ER. Vesicare initially helped but then no longer helpful.  myrbetriq was not effective.  Saw urology (MacDiarmid) - UDS largely overactive bladder with mild-mod stress incontinence. Improved on abx course - maintained on [place].    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   Pulmonary infiltrates 04/23/2019   06/13/2018-CT super D chest without contrast- generally stable chronic lung disease with pleural parenchymal scarring and scattered nodularity most of these nodules have been tree-in-bud in appearance likely postinfectious or inflammatory the dominant right lower lobe nodule seen on most recent study has  resolved no new or enlarging nodules  04/22/2019-CT chest without contrast- waxing and waning per   Pulmonary nodule 02/23/2012   RLL nodule-26mm stable 2006, April 2009, and June 2009 Small pulm nodules Jan 2012 - > Oct 2012 without change   Rheumatic heart disease mitral stenosis    mod MS by echo 02/2014   Right leg swelling 06/11/2020   R venous US 06/2020 WNL: - No evidence of deep vein thrombosis in the lower extremity. No indirect evidence of obstruction proximal to the inguinal ligament. - No cystic structure found in the popliteal fossa.   S/P aortic and mitral valve bioprostheses 12/08/2013   AVR 21 mm, MVR 25 mm-MagnaEase pericardia   S/P AVR (aortic valve replacement) 2015   bioprosthetic (Bartle)   S/P MVR (mitral valve replacement) 2015   bioprosthetic (Bartle)   Sepsis secondary to UTI (HCC) 08/22/2017   Severe aortic valve stenosis 10/01/2013   mild-mod by echo 02/2014   Skin lesion 01/03/2022   Small bowel obstruction, partial 08/2013  reolved without NGT placement.    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   Dr Amy [place]   Syncope 05/28/2018   Thrombocytopenia 11/04/2019   Typical atrial flutter 05/15/2019   Vitamin B12 deficiency 02/28/2015   Start B12 shots 02/2015    Vitamin D deficiency 10/23/2016   Wounds, multiple    bilateral legs   Past Surgical History:  Procedure Laterality Date   ANTERIOR LAT LUMBAR FUSION Left 07/17/2018   Procedure: Lumbar Two-Three Lumbar Three-Four Anterolateral decompression/interbody fusion with lateral plate fixation/Infuse;  Surgeon: Barnett Abu, MD;  Location: [facility] OR;  Service: Neurosurgery;  Laterality: Left;  Lumbar Two-Three Lumbar Three-Four Anterolateral decompression/interbody fusion with lateral plate fixation/Infuse   AORTIC VALVE REPLACEMENT N/A 12/12/2013   Procedure: AORTIC VALVE REPLACEMENT (AVR);  Surgeon: Alleen Borne, MD; Service: Open Heart Surgery   BACK SURGERY  2006, 2007   2006 SPACER,  2007 decompression and fusion L4/5   BOWEL RESECTION  04/02/2012   Procedure: SMALL BOWEL RESECTION;  Surgeon: [name], MD;  Location: [facility] ORS;  Service: General;  Laterality: N/A;   BRONCHIAL WASHINGS  03/24/2021   Procedure: BRONCHIAL WASHINGS;  Surgeon: [name], MD;  Location: [facility] ENDOSCOPY;  Service: Endoscopy;;   CARDIOVERSION N/A 08/29/2023   Procedure: CARDIOVERSION (CATH LAB);  Surgeon: Lewayne Bunting, MD;  Location: [facility] INVASIVE CV LAB;  Service: Cardiovascular;  Laterality: N/A;   CARPAL TUNNEL RELEASE Bilateral    CATARACT EXTRACTION W/ INTRAOCULAR LENS IMPLANT Left 10/18/2016   CATARACT EXTRACTION W/ INTRAOCULAR LENS IMPLANT Right 12/13/2016   Dr. Dagoberto Ligas   CHOLECYSTECTOMY N/A 01/21/2015   chronic cholecystitis, [name], MD   COLON RESECTION  08/25/2011   Procedure: COLON RESECTION LAPAROSCOPIC;  Surgeon: [name], MD;  Location: [facility];  Service: General;  Laterality: N/A;  Laparoscopic Assisted Low Anterior Resection Diverting Ostomy and onQ pain pump   COLONOSCOPY  03/2013   1 polyp, rpt 3 yrs Christella Hartigan)   COLONOSCOPY  05/2021   diverticulosis and patent colon anastomosis with some edema/narrowing, rpt 5 yrs Christella Hartigan)   COLONOSCOPY WITH PROPOFOL N/A 06/09/2016   patent colo-colonic anastomosis, rpt 5 yrs Christella Hartigan)   CYSTOSCOPY/URETEROSCOPY/HOLMIUM LASER/STENT PLACEMENT Right 07/03/2023   Procedure: CYSTOSCOPY RIGHT RETROGRADE PYEPLGRAM RIGHT URETEROSCOPY/HOLMIUM LASER/STENT PLACEMENT;  Surgeon: Crista Elliot, MD;  Location: [facility];  Service: Urology;  Laterality: Right;  1 HR FOR CASE   CYSTOSCOPY/URETEROSCOPY/HOLMIUM LASER/STENT PLACEMENT Right 07/17/2023   Procedure: CYSTOSCOPY/RIGHT URETEROSCOPY/STENT EXCHANGE;  Surgeon: Crista Elliot, MD;  Location: [facility];  Service: Urology;  Laterality: Right;  60 MINS FOR CASE   EP IMPLANTABLE DEVICE N/A 06/16/2016   Procedure: Pacemaker Implant;  Surgeon: Will Jorja Loa, MD;  Location: [facility] INVASIVE  CV LAB;  Service: Cardiovascular;  Laterality: N/A;   FOOT SURGERY  left foot   hammer toe   ILEOSTOMY  08/25/2011   ILEOSTOMY CLOSURE  04/02/2012   Procedure: ILEOSTOMY TAKEDOWN;  Surgeon: [name], MD;  Location: [facility] ORS;  Service: General;  Laterality: N/A;   INTRAOPERATIVE TRANSESOPHAGEAL ECHOCARDIOGRAM N/A 12/12/2013   Procedure: INTRAOPERATIVE TRANSESOPHAGEAL ECHOCARDIOGRAM;  Surgeon: Alleen Borne, MD;  Location: [facility] OR;  Service: Open Heart Surgery;  Laterality: N/A;   LEFT AND RIGHT HEART CATHETERIZATION WITH CORONARY ANGIOGRAM N/A 11/27/2013   Procedure: LEFT AND RIGHT HEART CATHETERIZATION WITH CORONARY ANGIOGRAM;  Surgeon: Thurmon Fair, MD;  Location: [facility] CATH LAB;  Service: Cardiovascular;  Laterality: N/A;   MITRAL VALVE REPLACEMENT N/A 12/12/2013   Procedure:  MITRAL VALVE (MV) REPLACEMENT OR REPAIR;  Surgeon: Alleen Borne, MD; Service: Open Heart Surgery   TEE WITHOUT CARDIOVERSION N/A 11/29/2013   Procedure: TRANSESOPHAGEAL ECHOCARDIOGRAM (TEE);  Surgeon: Quintella Reichert, MD;  Location: [facility] ENDOSCOPY;  Service: Cardiovascular;  Laterality: N/A;   TEE WITHOUT CARDIOVERSION N/A 06/04/2019   Procedure: TRANSESOPHAGEAL ECHOCARDIOGRAM (TEE);  Surgeon: Thurmon Fair, MD;  Location: [facility] ENDOSCOPY;  Service: Cardiovascular;  Laterality: N/A;   TONSILLECTOMY     TRANSESOPHAGEAL ECHOCARDIOGRAM (CATH LAB) N/A 08/29/2023   Procedure: TRANSESOPHAGEAL ECHOCARDIOGRAM;  Surgeon: Lewayne Bunting, MD;  Location: [facility] INVASIVE CV LAB;  Service: Cardiovascular;  Laterality: N/A;   VIDEO BRONCHOSCOPY N/A 03/24/2021   Procedure: VIDEO BRONCHOSCOPY WITHOUT FLUORO;  Surgeon: [name], MD;  Location: [facility] ENDOSCOPY;  Service: Endoscopy;  Laterality: N/A;  SMALL SCOPE,PREMEDICATE WITH NEBULIZED LIDOCAINE   Patient Active Problem List   Diagnosis Date Noted   Pressure sore on heel 06/15/2023   Lump of skin 06/06/2023   BRBPR (bright red blood per rectum) 04/24/2023   Head injury  04/24/2023   Cause of injury, fall 03/13/2023   Imbalance 03/13/2023   Venous ulcer of leg (HCC) 01/24/2023   Fatigue 10/05/2022   Positive ANA (antinuclear antibody) 05/30/2022   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   Lacunar infarction [facility])    Aortic atherosclerosis 03/03/2022   Peripheral neuropathy 01/03/2022   Hematuria 06/17/2021   Pulmonary artery hypertension 06/17/2021   Pacemaker 03/02/2021   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   Ascending aortic aneurysm (HCC) 03/02/2021   Lymphangioma 01/07/2021   Persistent atrial fibrillation 12/09/2020   Carotid stenosis 12/08/2020   Left hip pain 07/29/2020   Right leg swelling 06/11/2020   History of vertebral fracture 11/26/2019   Acute midline low back pain without sciatica 11/26/2019   Thrombocytopenia 11/04/2019   Anticoagulant long-term use 06/14/2019   Atrial flutter (HCC) 05/15/2019   DISH (diffuse idiopathic skeletal hyperostosis) 05/14/2019   Pulmonary infiltrates 04/23/2019   Other spondylosis with radiculopathy, lumbar region 07/17/2018   Syncope 05/28/2018   Arthropathy of spinal facet joint 03/08/2018   Malaise and fatigue 01/22/2018   Pain of right sacroiliac joint 08/09/2017   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 12/31/2016   Vitamin D deficiency 10/23/2016   Bilateral hip pain 09/08/2016   History of colonic polyps    Pedal edema 02/25/2016   Advanced care planning/counseling discussion 10/28/2015   CKD (chronic kidney disease) stage 2, GFR 60-89 ml/min 07/03/2015   Dyspnea on exertion 06/05/2015   Mixed stress and urge urinary incontinence 03/16/2015   Vitamin B12 deficiency 02/28/2015   Chronic insomnia 02/25/2015   COPD exacerbation (HCC) 12/11/2014   Osteoporosis 12/09/2014   Medicare annual wellness visit, subsequent 10/15/2014   Health maintenance examination 10/15/2014   Adjustment disorder with mixed anxiety and depressed mood 10/15/2014   Chronic diastolic heart failure 09/19/2014    Chest pain 08/25/2014   Mitral valve replaced 12/2013   Acute on chronic heart failure with preserved ejection fraction (HFpEF, >= 50%) (HCC) 10/10/2013   Pulmonary nodules 02/23/2012   History of radiation therapy    Macrocytic anemia 09/21/2011   History of rectal cancer 05/26/2011   Essential hypertension, benign 03/24/2009   Overweight (BMI 25.0-29.9) 03/05/2008   Chronic lower back pain 03/05/2008   Meralgia paresthetica 12/13/2007   GERD (gastroesophageal reflux disease) 08/06/2007   HLD (hyperlipidemia) 05/03/2007   COPD (chronic obstructive pulmonary disease) 05/03/2007    ONSET DATE: 06/10/22  REFERRING DIAG:  M54.50,G89.29 (ICD-10-CM) - Chronic midline  low back pain without sciatica  R26.89 (ICD-10-CM) - Imbalance    THERAPY DIAG:  No diagnosis found.  Rationale for Evaluation and Treatment: Rehabilitation  SUBJECTIVE:                                                                                                                                                                                             SUBJECTIVE STATEMENT: Pt reported to PT following recent hospitalization. States that she was admitted for CHF with Afib/Aflutter.  Reports that she feels weaker and more fatigued compared to last PT appointment in November.   Pt accompanied by: self  PERTINENT HISTORY: Patient has a long history of low back pain, arthritis, COPD, hypertension, compression fracture, hyperlipidemia, visual deficits in the left eye  From recent hospitalization:  77 y.o. female with a history of second-degree AV block, anxiety, depression, atrial fibrillation, PUD, aortic arteriosclerosis, ascending aortic aneurysm, diastolic heart failure. Patient presented secondary to shortness of breath and found to have evidence of acute diastolic heart failure in addition to COPD exacerbation. Heart failure complicated by high burden atrial fibrillation/flutter. Cardiology consulted. Patient diuresed  and given steroids/breathing treatments with improvement of symptoms. Patient underwent a Transesophageal Echocardiogram with cardioversion per cardiology recommendations prior to discharge.     From Eval: Pt reports with her balance she is falling in scenarios where she begins to feel off balance then she falls. She had a fall when putting out round up and other loss of balance when she was repotting a plant and lost her balance.  Both of her fall she noted were in flexed posture for prolonged period likely resulting in inability to perform adequate extension to get out of this posture resulting in fall.  Pt also has low back pain from " disc fragments" that are in her back. The pain worsens with longer standing. Pt low back pain for years. She cannot go into extension and cannot lay on her stomach.  PAIN:  Are you having pain? Yes: NPRS scale: 3/10 Pain location: low back across both sides.  Pain description: Dull, aching Aggravating factors: standing, walking Relieving factors: sitting ( flexed postures)  PRECAUTIONS: Fall  RED FLAGS: None   WEIGHT BEARING RESTRICTIONS: No  FALLS: Has patient fallen in last 6 months? Yes. Number of falls 2  LIVING ENVIRONMENT: Lives with: lives with their family Lives in: House/apartment Stairs: Yes: Internal: 3 steps; can reach both Has following equipment at home: Single point cane, quad cane  PLOF: Independent with basic ADLs  PATIENT GOALS: Improve balance, decrease falls, improve her back pain   OBJECTIVE:   DIAGNOSTIC FINDINGS: From MRI  of lumbar spine in June 2024 interpreted by radiologist and impression as below  IMPRESSION: 1. No acute fracture or acute finding. 2. Chronic compression fracture of L1 stable from the prior lumbar spine CT. 3. Postoperative changes from fusions, L2-L3, L3-L4 and L4-L5, also stable from the prior C  COGNITION: Overall cognitive status: Within functional limits for tasks assessed   SENSATION: Not  tested   POSTURE: rounded shoulders, decreased lumbar lordosis, increased thoracic kyphosis, and flexed trunk   LOWER EXTREMITY ROM:     MMT Right Eval Left Eval  Hip flexion 4- 4-  Hip extension 4- 4-  Hip abduction 4 4  Hip adduction 4+ 4+  Hip internal rotation    Hip external rotation    Knee flexion 4 4-  Knee extension 5 4+  Ankle dorsiflexion 4 4  Ankle plantarflexion    Ankle inversion    Ankle eversion     (Blank rows = not tested)   (Blank rows = not tested)  BED MOBILITY:  Pt does not sleep in the bed, currently sleeps in recliner  TRANSFERS: Assistive device utilized: Single point cane  Sit to stand: Modified independence Stand to sit: Modified independence Chair to chair: Modified independence Floor:  does not attempt   STAIRS: Level of Assistance: SBA Stair Negotiation Technique: Alternating Pattern  with Bilateral Rails Number of Stairs: 4  Height of Stairs: 6 in  Comments:   GAIT: Gait pattern: decreased arm swing- Right, decreased arm swing- Left, decreased step length- Right, decreased step length- Left, decreased stance time- Right, decreased stance time- Left, and trunk flexed Distance walked: 80 ft Assistive device utilized: Quad cane small base Level of assistance: Modified independence Comments: flexed posture improved with use of rollator but UE fatigued with use of rollator.   FUNCTIONAL TESTS:  See goal assessment for updated outcome measure assessment.     PATIENT SURVEYS:  FOTO 39 RAG of 84  TODAY'S TREATMENT:        PT performed Re-Eval. See goal assessment for details.   PATIENT EDUCATION: Education details: POC Person educated: Patient Education method: Explanation Education comprehension: verbalized understanding  HOME EXERCISE PROGRAM: Establish visit 2   GOALS: Goals reviewed with patient? Yes  SHORT TERM GOALS: Target date: 10/20/2023    Patient will be independent in home exercise program to improve  strength/mobility for better functional independence with ADLs. Baseline: No HEP currently  Goal status: INITIAL   LONG TERM GOALS: Target date: 12/08/2023     1.  Patient (> 31 years old) will complete five times sit to stand test in < 15 seconds indicating an increased LE strength and improved balance. Baseline: 17 sec with UE assist  12/6: adjusted baseline: 18.1sec  Goal status: INITIAL  2.  Patient will increase FOTO score to equal to or greater than  53   to demonstrate statistically significant improvement in mobility and quality of life.  Baseline: 47 12/6 adjusted baseline: 46  Goal status: INITIAL   3.   Patient will reduce timed up and go to <11 seconds to reduce fall risk and demonstrate improved transfer/gait ability. Baseline: 14 sec, [place] use for STS  12/6: adjusted baseline: 18.5 with UE support and SPC  Goal status:  INITIAL    4.   Patient will increase 10 meter walk test to >0.75m/s as to improve gait speed for better community ambulation and to reduce fall risk. Baseline: .675 m/s no AD  12/6: adjusted baseline: .631m/s Goal status: INITIAL Adjusted:  5.   Patient will increase six minute walk test distance to >500 without desat <92% or increased in back pain for progression to community ambulator and improve gait ability Baseline: 450 ft with clinic rollator to offset low back pain  12/6: 63ft with desat to 91%  Goal status: INITIAL Adjusted:   6. Pt will improve Berg balance Scale score by 6 points to indicate reduced fall risk and improved ability to access community.  Baseline: to be completed at next visit Goal status: new. Initial       ASSESSMENT:      CLINICAL IMPRESSION:  Pt presented to PT following hospitalization from CHF. PT instructed pt in re-evaluation. Pt demonstrates reduced functional and increased fall risk compared to prior session with decreased gait speed, increased time on 5xSTS and TUG as well as increased SOB with short  distance gait. Goals adjusted given new functional level.  Pt will continue to benefit from skilled physical therapy intervention to address impairments, improve QOL, and attain therapy goals.    OBJECTIVE IMPAIRMENTS: Abnormal gait, decreased activity tolerance, decreased balance, decreased endurance, decreased mobility, difficulty walking, decreased ROM, decreased strength, hypomobility, and pain.   ACTIVITY LIMITATIONS: carrying, lifting, standing, sleeping, stairs, transfers, and locomotion level  PARTICIPATION LIMITATIONS: driving, community activity, and yard work  PERSONAL FACTORS: Age, Fitness, Time since onset of injury/illness/exacerbation, and 3+ comorbidities:  arthritis, COPD, hypertension, compression fracture, hyperlipidemia, visual deficits in the left eye  are also affecting patient's functional outcome.   REHAB POTENTIAL: Good  CLINICAL DECISION MAKING: Stable/uncomplicated  EVALUATION COMPLEXITY: Low  PLAN:  PT FREQUENCY: 2x/week  PT DURATION: 12 weeks  PLANNED INTERVENTIONS: Therapeutic exercises, Therapeutic activity, Neuromuscular re-education, Balance training, Gait training, Patient/Family education, Self Care, Joint mobilization, Joint manipulation, Stair training, Dry Needling, and Manual therapy  PLAN FOR NEXT SESSION:   HEP, BERG. UE strength for walker use, postural strengthening ( post chain), balance   Golden Pop PT ,DPT Physical Therapist- [place]  [facility]

## 2023-09-18 ENCOUNTER — Encounter (HOSPITAL_COMMUNITY): Payer: Self-pay | Admitting: Oncology

## 2023-09-18 ENCOUNTER — Encounter: Payer: Medicare Other | Attending: Physician Assistant | Admitting: Physician Assistant

## 2023-09-18 DIAGNOSIS — L97818 Non-pressure chronic ulcer of other part of right lower leg with other specified severity: Secondary | ICD-10-CM | POA: Diagnosis not present

## 2023-09-18 DIAGNOSIS — X58XXXD Exposure to other specified factors, subsequent encounter: Secondary | ICD-10-CM | POA: Insufficient documentation

## 2023-09-18 DIAGNOSIS — I10 Essential (primary) hypertension: Secondary | ICD-10-CM | POA: Diagnosis not present

## 2023-09-18 DIAGNOSIS — S40811D Abrasion of right upper arm, subsequent encounter: Secondary | ICD-10-CM | POA: Diagnosis not present

## 2023-09-18 DIAGNOSIS — J449 Chronic obstructive pulmonary disease, unspecified: Secondary | ICD-10-CM | POA: Diagnosis not present

## 2023-09-18 DIAGNOSIS — Z950 Presence of cardiac pacemaker: Secondary | ICD-10-CM | POA: Insufficient documentation

## 2023-09-18 DIAGNOSIS — L97821 Non-pressure chronic ulcer of other part of left lower leg limited to breakdown of skin: Secondary | ICD-10-CM | POA: Diagnosis not present

## 2023-09-18 DIAGNOSIS — Z7901 Long term (current) use of anticoagulants: Secondary | ICD-10-CM | POA: Diagnosis not present

## 2023-09-18 DIAGNOSIS — I87333 Chronic venous hypertension (idiopathic) with ulcer and inflammation of bilateral lower extremity: Secondary | ICD-10-CM | POA: Diagnosis present

## 2023-09-18 DIAGNOSIS — L97328 Non-pressure chronic ulcer of left ankle with other specified severity: Secondary | ICD-10-CM | POA: Diagnosis not present

## 2023-09-18 NOTE — Progress Notes (Signed)
GRAYCI, KELEMEN ([id number]) [id number] Page 1 of 11 Visit Report for 09/18/2023 Arrival Information Details Patient Name: Date of Service: JASANI, AKRIDGE 09/18/2023 1:15 PM Medical Record Number: [id number] Patient Account Number: [id number] Date of Birth/Sex: Treating RN: [birth year] (77 y.o. [name] Primary Care Hatem Cull: [name] Other Clinician: Referring Remell Giaimo: Treating Rodricus Candelaria/Extender: Nonah Mattes Weeks in Treatment: 7 Visit Information History Since Last Visit Added or deleted any medications: No Patient Arrived: Gilmer Mor Any new allergies or adverse reactions: No Arrival Time: 13:09 Has Dressing in Place as Prescribed: Yes Accompanied By: self Pain Present Now: No Transfer Assistance: None Patient Identification Verified: Yes Secondary Verification Process Completed: Yes Patient Requires Transmission-Based Precautions: No Patient Has Alerts: Yes Patient Alerts: Patient on Blood Thinner eliquis R ABI 1.25 TBI 1.08 L ABI 1.29 TBI 0.92 PACEMAKER Electronic Signature(s) Unsigned Entered By: Midge Aver on 09/18/2023 13:16:34 -------------------------------------------------------------------------------- Clinic Level of Care Assessment Details Patient Name: Date of Service: CALEIGH, BELLEFEUILLE 09/18/2023 1:15 PM Medical Record Number: [id number] Patient Account Number: [id number] Date of Birth/Sex: Treating RN: [birth year] (77 y.o. [name] Primary Care Conchita Truxillo: [name] Other Clinician: Referring Miquel Stacks: Treating Meryl Ponder/Extender: Nonah Mattes Weeks in Treatment: 7 Clinic Level of Care Assessment Items TOOL 4 Quantity Score X- 1 0 Use when only an EandM is performed on FOLLOW-UP visit ASSESSMENTS - Nursing Assessment / Reassessment X- 1 10 Reassessment of Co-morbidities (includes updates in patient status) EVALIE, RIDENHOUR ([id number])  [phone].pdf Page 2 of 11 X- 1 5 Reassessment of Adherence to Treatment Plan ASSESSMENTS - Wound and Skin A ssessment / Reassessment []  - 0 Simple Wound Assessment / Reassessment - one wound X- 4 5 Complex Wound Assessment / Reassessment - multiple wounds []  - 0 Dermatologic / Skin Assessment (not related to wound area) ASSESSMENTS - Focused Assessment []  - 0 Circumferential Edema Measurements - multi extremities []  - 0 Nutritional Assessment / Counseling / Intervention []  - 0 Lower Extremity Assessment (monofilament, tuning fork, pulses) []  - 0 Peripheral Arterial Disease Assessment (using hand held doppler) ASSESSMENTS - Ostomy and/or Continence Assessment and Care []  - 0 Incontinence Assessment and Management []  - 0 Ostomy Care Assessment and Management (repouching, etc.) PROCESS - Coordination of Care []  - 0 Simple Patient / Family Education for ongoing care X- 1 20 Complex (extensive) Patient / Family Education for ongoing care X- 1 10 Staff obtains Chiropractor, Records, T Results / Process Orders est []  - 0 Staff telephones HHA, Nursing Homes / Clarify orders / etc []  - 0 Routine Transfer to another Facility (non-emergent condition) []  - 0 Routine Hospital Admission (non-emergent condition) []  - 0 New Admissions / Manufacturing engineer / Ordering NPWT Apligraf, etc. , []  - 0 Emergency Hospital Admission (emergent condition) []  - 0 Simple Discharge Coordination X- 1 15 Complex (extensive) Discharge Coordination PROCESS - Special Needs []  - 0 Pediatric / Minor Patient Management []  - 0 Isolation Patient Management []  - 0 Hearing / Language / Visual special needs []  - 0 Assessment of Community assistance (transportation, D/C planning, etc.) []  - 0 Additional assistance / Altered mentation []  - 0 Support Surface(s) Assessment (bed, cushion, seat, etc.) INTERVENTIONS - Wound Cleansing / Measurement []  - 0 Simple Wound Cleansing -  one wound X- 4 5 Complex Wound Cleansing - multiple wounds X- 1 5 Wound Imaging (photographs - any number of wounds) []  - 0 Wound Tracing (instead of photographs) []  - 0 Simple Wound Measurement - one wound X- 4  5 Complex Wound Measurement - multiple wounds INTERVENTIONS - Wound Dressings X - Small Wound Dressing one or multiple wounds 4 10 []  - 0 Medium Wound Dressing one or multiple wounds []  - 0 Large Wound Dressing one or multiple wounds []  - 0 Application of Medications - topical []  - 0 Application of Medications - injection INTERVENTIONS - Miscellaneous TARYAH, SHIFLETT ([id number]) [id number] Page 3 of 11 []  - 0 External ear exam []  - 0 Specimen Collection (cultures, biopsies, blood, body fluids, etc.) []  - 0 Specimen(s) / Culture(s) sent or taken to Lab for analysis []  - 0 Patient Transfer (multiple staff / Michiel Sites Lift / Similar devices) []  - 0 Simple Staple / Suture removal (25 or less) []  - 0 Complex Staple / Suture removal (26 or more) []  - 0 Hypo / Hyperglycemic Management (close monitor of Blood Glucose) []  - 0 Ankle / Brachial Index (ABI) - do not check if billed separately X- 1 5 Vital Signs Has the patient been seen at the hospital within the last three years: Yes Total Score: 170 Level Of Care: New/Established - Level 5 Electronic Signature(s) Unsigned Entered By: Midge Aver on 09/18/2023 14:04:17 -------------------------------------------------------------------------------- Lower Extremity Assessment Details Patient Name: Date of Service: ALAYIAH, BARTELL 09/18/2023 1:15 PM Medical Record Number: [id number] Patient Account Number: [id number] Date of Birth/Sex: Treating RN: 1946-03-27 (77 y.o. [name] Primary Care Auryn Paige: [name] Other Clinician: Referring Balian Schaller: Treating Jaxson Anglin/Extender: Nonah Mattes Weeks in Treatment: 7 Edema Assessment Assessed: [Left: Yes] [Right:  Yes] [Left: Edema] [Right: :] Calf Left: Right: Point of Measurement: 32 cm From Medial Instep 29 cm 32 cm Ankle Left: Right: Point of Measurement: 12 cm From Medial Instep 20 cm 20.5 cm Knee To Floor Left: Right: From Medial Instep 35 cm 35 cm Vascular Assessment Pulses: Dorsalis Pedis Palpable: [Left:Yes] [Right:Yes] Extremity colors, hair growth, and conditions: Extremity Color: [Left:Hyperpigmented] [Right:Hyperpigmented] Hair Growth on Extremity: [Left:No] [Right:No] Temperature of Extremity: [Left:Warm] [Right:Warm] MEILANY, NIPPERT ([id number]) [Right:[id number] Page 4 of 11] Capillary Refill: [Left:< 3 seconds] [Right:< 3 seconds] Dependent Rubor: [Left:No] [Right:No] Blanched when Elevated: [Left:No No] [Right:No No] Toe Nail Assessment Left: Right: Thick: No No Discolored: No No Deformed: No No Improper Length and Hygiene: No No Electronic Signature(s) Unsigned Entered By: Midge Aver on 09/18/2023 13:35:29 -------------------------------------------------------------------------------- Multi Wound Chart Details Patient Name: Date of Service: LAINIE, DEHOOG 09/18/2023 1:15 PM Medical Record Number: [id number] Patient Account Number: [id number] Date of Birth/Sex: Treating RN: 1945-11-05 (77 y.o. [name] Primary Care Sativa Gelles: [name] Other Clinician: Referring Dorthia Tout: Treating Geneve Kimpel/Extender: Nonah Mattes Weeks in Treatment: 7 Vital Signs Height(in): 63 Pulse(bpm): 76 Weight(lbs): 141.8 Blood Pressure(mmHg): 156/73 Body Mass Index(BMI): 25.1 Temperature(F): 98.0 Respiratory Rate(breaths/min): 16 [4:Photos:] Right, Anterior Forearm Right Elbow Right, Anterior Lower Leg Wound Location: Skin Tear/Laceration Skin Tear/Laceration Skin Tear/Laceration Wounding Event: Skin Tear Skin Tear Skin Tear Primary Etiology: Chronic Obstructive Pulmonary Chronic Obstructive Pulmonary Chronic  Obstructive Pulmonary Comorbid History: Disease (COPD), Hypertension Disease (COPD), Hypertension Disease (COPD), Hypertension 07/19/2023 09/10/2023 09/10/2023 Date Acquired: 7 0 0 Weeks of Treatment: Open Open Open Wound Status: No No No Wound Recurrence: 0.5x0.5x0.1 1x0.5x0.1 0.8x0.5x0.1 Measurements L x W x D (cm) 0.196 0.393 0.314 A (cm) : rea 0.02 0.039 0.031 Volume (cm) : 97.60% N/A N/A % Reduction in Area: 97.60% N/A N/A % Reduction in Volume: Full Thickness Without Exposed Full Thickness Without Exposed Full Thickness Without Exposed Classification: Support Structures Support Structures Support Structures Large Medium  Medium Exudate Amount: ZENA, WEERTS ([id number]) [phone].pdf Page 5 of 11 Sanguinous Serosanguineous Serosanguineous Exudate Type: red red, brown red, brown Exudate Color: Medium (34-66%) Medium (34-66%) Medium (34-66%) Granulation Amount: Red Red Red Granulation Quality: N/A None Present (0%) N/A Necrotic Amount: Fat Layer (Subcutaneous Tissue): Yes Fat Layer (Subcutaneous Tissue): Yes Fat Layer (Subcutaneous Tissue): Yes Exposed Structures: Fascia: No Fascia: No Fascia: No Tendon: No Tendon: No Tendon: No Muscle: No Muscle: No Muscle: No Joint: No Joint: No Joint: No Bone: No Bone: No Bone: No Small (1-33%) Medium (34-66%) Medium (34-66%) Epithelialization: Wound Number: 7 N/A N/A Photos: N/A N/A Left, Lateral Ankle N/A N/A Wound Location: Pressure Injury N/A N/A Wounding Event: Pressure Ulcer N/A N/A Primary Etiology: Chronic Obstructive Pulmonary N/A N/A Comorbid History: Disease (COPD), Hypertension 09/10/2023 N/A N/A Date Acquired: 0 N/A N/A Weeks of Treatment: Open N/A N/A Wound Status: No N/A N/A Wound Recurrence: 0.3x0.3x0.1 N/A N/A Measurements L x W x D (cm) 0.071 N/A N/A A (cm) : rea 0.007 N/A N/A Volume (cm) : N/A N/A N/A % Reduction in A rea: N/A N/A N/A % Reduction in  Volume: Category/Stage II N/A N/A Classification: None Present N/A N/A Exudate A mount: N/A N/A N/A Exudate Type: N/A N/A N/A Exudate Color: Medium (34-66%) N/A N/A Granulation A mount: Pale N/A N/A Granulation Quality: None Present (0%) N/A N/A Necrotic A mount: Fascia: No N/A N/A Exposed Structures: Fat Layer (Subcutaneous Tissue): No Tendon: No Muscle: No Joint: No Bone: No N/A N/A N/A Epithelialization: Treatment Notes Electronic Signature(s) Unsigned Entered By: Midge Aver on 09/18/2023 14:01:00 -------------------------------------------------------------------------------- Pain Assessment Details Patient Name: Date of Service: RENIKA, DARPINO 09/18/2023 1:15 PM Medical Record Number: [id number] Patient Account Number: [id number] Date of Birth/Sex: Treating RN: [birth year] (77 y.o. [name] Primary Care Dena Esperanza: [name] Other Clinician: Robin Searing ([id number]) [id number] Page 6 of 11 Referring Willmar Stockinger: Treating Ollie Esty/Extender: Haze Boyden in Treatment: 7 Active Problems Location of Pain Severity and Description of Pain Patient Has Paino No Site Locations Pain Management and Medication Current Pain Management: Electronic Signature(s) Unsigned Entered By: Midge Aver on 09/18/2023 13:17:01 -------------------------------------------------------------------------------- Wound Assessment Details Patient Name: Date of Service: MARVELLE, MORRELL 09/18/2023 1:15 PM Medical Record Number: [id number] Patient Account Number: [id number] Date of Birth/Sex: Treating RN: [birth year] (77 y.o. [name] Primary Care Khia Dieterich: [name] Other Clinician: Referring Shaconda Hajduk: Treating Veleda Mun/Extender: Nonah Mattes Weeks in Treatment: 7 Wound Status Wound Number: 4 Primary Skin Tear Etiology: Wound Location: Right, Anterior Forearm Wound Status:  Open Wounding Event: Skin Tear/Laceration Comorbid Chronic Obstructive Pulmonary Disease (COPD), Date Acquired: 07/19/2023 History: Hypertension Weeks Of Treatment: 7 Clustered Wound: No Photos LORIA, AREHART ([id number]) [phone].pdf Page 7 of 11 Wound Measurements Length: (cm) 0.5 Width: (cm) 0.5 Depth: (cm) 0.1 Area: (cm) 0.196 Volume: (cm) 0.02 % Reduction in Area: 97.6% % Reduction in Volume: 97.6% Epithelialization: Small (1-33%) Wound Description Classification: Full Thickness Without Exposed Suppor Exudate Amount: Large Exudate Type: Sanguinous Exudate Color: red t Structures Foul Odor After Cleansing: No Slough/Fibrino No Wound Bed Granulation Amount: Medium (34-66%) Exposed Structure Granulation Quality: Red Fascia Exposed: No Fat Layer (Subcutaneous Tissue) Exposed: Yes Tendon Exposed: No Muscle Exposed: No Joint Exposed: No Bone Exposed: No Electronic Signature(s) Unsigned Entered By: Midge Aver on 09/18/2023 13:28:52 -------------------------------------------------------------------------------- Wound Assessment Details Patient Name: Date of Service: DONNAMARIE, REDDIC 09/18/2023 1:15 PM Medical Record Number: [id number] Patient Account Number: [id number] Date of Birth/Sex: Treating RN: [birth year] (  77 y.o. [name] Primary Care Riannah Stagner: [name] Other Clinician: Referring Gertrude Bucks: Treating Moraima Burd/Extender: Nonah Mattes Weeks in Treatment: 7 Wound Status Wound Number: 5 Primary Skin Tear Etiology: Wound Location: Right Elbow Wound Status: Open Wounding Event: Skin Tear/Laceration Comorbid Chronic Obstructive Pulmonary Disease (COPD), Date Acquired: 09/10/2023 History: Hypertension Weeks Of Treatment: 0 Clustered Wound: No Photos MARIFER, AMAYA ([id number]) [phone].pdf Page 8 of 11 Wound Measurements Length: (cm) 1 Width: (cm) 0.5 Depth: (cm)  0.1 Area: (cm) 0.393 Volume: (cm) 0.039 % Reduction in Area: % Reduction in Volume: Epithelialization: Medium (34-66%) Tunneling: No Undermining: No Wound Description Classification: Full Thickness Without Exposed Suppor Exudate Amount: Medium Exudate Type: Serosanguineous Exudate Color: red, brown t Structures Foul Odor After Cleansing: No Slough/Fibrino No Wound Bed Granulation Amount: Medium (34-66%) Exposed Structure Granulation Quality: Red Fascia Exposed: No Necrotic Amount: None Present (0%) Fat Layer (Subcutaneous Tissue) Exposed: Yes Tendon Exposed: No Muscle Exposed: No Joint Exposed: No Bone Exposed: No Electronic Signature(s) Unsigned Entered By: Midge Aver on 09/18/2023 13:30:01 -------------------------------------------------------------------------------- Wound Assessment Details Patient Name: Date of Service: RAJA, VERHOEVEN 09/18/2023 1:15 PM Medical Record Number: [id number] Patient Account Number: [id number] Date of Birth/Sex: Treating RN: 04/10/1946 (77 y.o. [name] Primary Care Amir Glaus: [name] Other Clinician: Referring Kinsie Belford: Treating Monigue Spraggins/Extender: Nonah Mattes Weeks in Treatment: 7 Wound Status Wound Number: 6 Primary Skin Tear Etiology: Wound Location: Right, Anterior Lower Leg Wound Status: Open Wounding Event: Skin Tear/Laceration Comorbid Chronic Obstructive Pulmonary Disease (COPD), Date Acquired: 09/10/2023 History: Hypertension Weeks Of Treatment: 0 Clustered Wound: No Photos MAKALIAH, GERBITZ ([id number]) [phone].pdf Page 9 of 11 Wound Measurements Length: (cm) 0.8 Width: (cm) 0.5 Depth: (cm) 0.1 Area: (cm) 0.314 Volume: (cm) 0.031 % Reduction in Area: % Reduction in Volume: Epithelialization: Medium (34-66%) Tunneling: No Undermining: No Wound Description Classification: Full Thickness Without Exposed Suppor Exudate Amount: Medium Exudate Type:  Serosanguineous Exudate Color: red, brown t Structures Foul Odor After Cleansing: No Slough/Fibrino No Wound Bed Granulation Amount: Medium (34-66%) Exposed Structure Granulation Quality: Red Fascia Exposed: No Fat Layer (Subcutaneous Tissue) Exposed: Yes Tendon Exposed: No Muscle Exposed: No Joint Exposed: No Bone Exposed: No Electronic Signature(s) Unsigned Entered By: Midge Aver on 09/18/2023 13:31:42 -------------------------------------------------------------------------------- Wound Assessment Details Patient Name: Date of Service: MATIANA, MARTINCIC 09/18/2023 1:15 PM Medical Record Number: [id number] Patient Account Number: [id number] Date of Birth/Sex: Treating RN: Apr 24, 1946 (77 y.o. [name] Primary Care Yetta Marceaux: [name] Other Clinician: Referring Beulah Capobianco: Treating Julane Crock/Extender: Nonah Mattes Weeks in Treatment: 7 Wound Status Wound Number: 7 Primary Etiology: Pressure Ulcer Wound Location: Left, Lateral Ankle Wound Status: Open Wounding Event: Pressure Injury Comorbid Chronic Obstructive Pulmonary Disease (COPD), History: Hypertension Date Acquired: 09/10/2023 Weeks Of Treatment: 0 Clustered Wound: No Photos KEIYANA, STUFFLEBEAM ([id number]) [phone].pdf Page 10 of 11 Wound Measurements Length: (cm) Width: (cm) Depth: (cm) Area: (cm) Volume: (cm) 0.3 % Reduction in Area: 0.3 % Reduction in Volume: 0.1 0.071 0.007 Wound Description Classification: Category/Stage II Exudate Amount: None Present Foul Odor After Cleansing: No Slough/Fibrino No Wound Bed Granulation Amount: Medium (34-66%) Exposed Structure Granulation Quality: Pale Fascia Exposed: No Necrotic Amount: None Present (0%) Fat Layer (Subcutaneous Tissue) Exposed: No Tendon Exposed: No Muscle Exposed: No Joint Exposed: No Bone Exposed: No Electronic Signature(s) Unsigned Entered By: Midge Aver on 09/18/2023  13:33:16 -------------------------------------------------------------------------------- Vitals Details Patient Name: Date of Service: KAROLYNN, ANDREASSEN 09/18/2023 1:15 PM Medical Record Number: [id number] Patient Account Number: [id number]  Date of Birth/Sex: Treating RN: [birth year] (77 y.o. [name] Primary Care Jeylin Woodmansee: [name] Other Clinician: Referring Giannamarie Paulus: Treating Trinda Harlacher/Extender: Nonah Mattes Weeks in Treatment: 7 Vital Signs Time Taken: 13:16 Temperature (F): 98.0 Height (in): 63 Pulse (bpm): 76 Weight (lbs): 141.8 Respiratory Rate (breaths/min): 16 Body Mass Index (BMI): 25.1 Blood Pressure (mmHg): 156/73 Reference Range: 80 - 120 mg / dl Electronic Signature(s) MYRIAH, BUSHAW ([id number]) [id number] Page 11 of 11 Entered By: Midge Aver on 09/18/2023 13:16:55 Signature(s): Date(s):

## 2023-09-18 NOTE — Progress Notes (Signed)
[name] ([id number]) [id number] Page 1 of 8 Visit Report for 09/18/2023 Chief Complaint Document Details Patient Name: Date of Service: [name], [name] 09/18/2023 1:15 PM Medical Record Number: [id number] Patient Account Number: [id number] Date of Birth/Sex: Treating RN: 1945-12-07 (77 y.o. [name] Primary Care Provider: Eustaquio [name] Other Clinician: Referring Provider: Treating Provider/Extender: [name]: 7 Information Obtained from: Patient Chief Complaint 07/26/2023; new patient visit for wounds on her bilateral lower legs and a more recent abrasion to her right forearm after a fall Electronic Signature(s) Signed: 09/18/2023 1:10:59 PM By: Diane Derry PA-C Entered By: [name] on 09/18/2023 10:10:59 -------------------------------------------------------------------------------- HPI Details Patient Name: Date of Service: [name] 09/18/2023 1:15 PM Medical Record Number: [id number] Patient Account Number: [id number] Date of Birth/Sex: Treating RN: 17-Jun-1946 (77 y.o. [name] Primary Care Provider: Eustaquio [name] Other Clinician: Referring Provider: Treating Provider/Extender: [name]: 7 History of Present Illness HPI Description: 07/26/2023 ADMISSION This is a 77 year old woman who has been referred for wounds on her bilateral lower legs by her primary physician. She was last seen in primary care on 06/15/2023 at which time she had an abrasion injury to the left anterior shin right medial leg and some scabbing over the left lateral malleolus. She also had bilateral heel issues although they were not open today. A culture of 1 of these wounds showed Pseudomonas I am not exactly sure what antibiotic she received. She had arterial studies done on 07/13/2023 that showed an ABI on the right of 1.25 TBI 1.08 with biphasic and  triphasic waveforms. On the left ABI I was 1.29 TBI at 0.92 and triphasic waveforms. All of this suggests that her arterial flow was quite good. The patient states that she has on and off wounds in her bilateral lower extremities for at least a year. The areas will heal and then reopen. She has been prescribed compression stockings in the past but she cannot get them on. She lives independently. A week ago she had a fall at home causing a skin tear on the right dorsal arm. 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 Her wounds are a lot better with simple [name], [name] ([id number]) [phone].pdf Page 2 of 8 compression of an Urgo K2 light. I think she is going to require ongoing compression stockings. She reminds me that she cannot get standard over the toe stockings on. We will look at external compression stockings The area on the right anterior arm we use Xeroform to last week. This apparently stuck to the wound she seemed particularly cross about this. She wants to continue to use triamcinolone and T on it. I did not argue this point elfa 10/30; patient with severe chronic venous insufficiency in her bilateral lower legs. Her wounds have essentially close she does have some eschared areas but nothing is open. She has a single JuxtaLite stocking but we have prior authorization for a second juxta lite stocking. She cannot get standard stockings on her legs. She also had an abrasion on the right arm. This is still open. She has been using triamcinolone on this with a nonstick pad. We tried Vaseline gauze, Xeroform but according to the patient and the stuck to the wound bed. I wonder if the triamcinolone is actually inhibiting healing of this at this point.  09-18-2023 upon evaluation  today patient appears to be doing well currently in regard to her wounds. She has been tolerating the dressing changes without complication. Electronic Signature(s) Signed: 09/18/2023 5:24:32 PM By: Diane Derry PA-C Entered By: [name] on 09/18/2023 14:24:32 -------------------------------------------------------------------------------- Physical Exam Details Patient Name: Date of Service: [name], [name] 09/18/2023 1:15 PM Medical Record Number: [id number] Patient Account Number: [id number] Date of Birth/Sex: Treating RN: [birth year] (77 y.o. [name] Primary Care Provider: Eustaquio [name] Other Clinician: Referring Provider: Treating Provider/Extender: [name]: 7 Constitutional Well-nourished and well-hydrated in no acute distress. Respiratory normal breathing without difficulty. Psychiatric this patient is able to make decisions and demonstrates good insight into disease process. Alert and Oriented x 3. pleasant and cooperative. Notes Upon inspection patient's wounds are currently showing signs of good granulation and epithelization at this point. Fortunately I do not see any signs of active infection at this time which is good news. Electronic Signature(s) Signed: 09/18/2023 5:24:51 PM By: Diane Derry PA-C Entered By: [name] on 09/18/2023 14:24:50 -------------------------------------------------------------------------------- Physician Orders Details Patient Name: Date of Service: [name] 09/18/2023 1:15 PM [name] ([id number]) [id number] Page 3 of 8 Medical Record Number: [id number] Patient Account Number: [id number] Date of Birth/Sex: Treating RN: 02/15/1946 (77 y.o. [name] Primary Care Provider: Eustaquio [name] Other Clinician: Referring Provider: Treating Provider/Extender: [name]: 7 The following information  was scribed by: [name] The information was scribed for: [name] Verbal / Phone Orders: No Diagnosis Coding ICD-10 Coding Code Description [phone] Chronic venous hypertension (idiopathic) with ulcer and inflammation of bilateral lower extremity L97.821 Non-pressure chronic ulcer of other part of left lower leg limited to breakdown of skin L97.818 Non-pressure chronic ulcer of other part of right lower leg with other specified severity L97.328 Non-pressure chronic ulcer of left ankle with other specified severity S40.811D Abrasion of right upper arm, subsequent encounter Follow-up Appointments Return Appointment in 1 week. Bathing/ [employer] wounds with antibacterial soap and water. May shower; gently cleanse wound with antibacterial soap, rinse and pat dry prior to dressing wounds No tub bath. Anesthetic (Use 'Patient Medications' Section for Anesthetic Order Entry) Lidocaine applied to wound bed Edema Control - Orders / Instructions Patient to wear own Velcro compression garment. Remove compression stockings every night before going to bed and put on every morning when getting up. - Left lower leg Other: - Juxta Lite Wound [name] Wound #4 - Forearm Wound Laterality: Right, Anterior Cleanser: Byram Ancillary Kit - 15 Day Supply (Generic) 1 x Per Day/30 Days Discharge Instructions: Use supplies as instructed; Kit contains: (15) Saline Bullets; (15) 3x3 Gauze; 15 pr Gloves Cleanser: Soap and Water 1 x Per Day/30 Days Discharge Instructions: Gently cleanse wound with antibacterial soap, rinse and pat dry prior to dressing wounds Topical: Triamcinolone Acetonide Cream, 0.1%, 15 (g) tube 1 x Per Day/30 Days Discharge Instructions: Apply as directed by provider. Secondary Dressing: telfa nonstick pad 1 x Per Day/30 Days Secured With: Coban Cohesive Bandage 4x5 (yds) Stretched 1 x Per Day/30 Days Discharge Instructions: Apply Coban as directed. Wound #5 - Elbow Wound  Laterality: Right Cleanser: Soap and Water 1 x Per Day/15 Days Discharge Instructions: Gently cleanse wound with antibacterial soap, rinse and pat dry prior to dressing wounds Cleanser: Vashe 5.8 (oz) 1 x Per Day/15 Days Discharge Instructions: Use vashe 5.8 (oz) as directed Prim Dressing: Xeroform 4x4-HBD (in/in) 1 x Per Day/15 Days  ary Discharge Instructions: Apply Xeroform 4x4-HBD (in/in) as directed Secondary Dressing: Coverlet Latex-Free Fabric Adhesive Dressings 1 x Per Day/15 Days Discharge Instructions: 1.5 x 2 Wound #6 - Lower Leg Wound Laterality: Right, Anterior Cleanser: Soap and Water 1 x Per Day/15 Days Discharge Instructions: Gently cleanse wound with antibacterial soap, rinse and pat dry prior to dressing wounds Cleanser: Vashe 5.8 (oz) 1 x Per Day/15 Days Discharge Instructions: Use vashe 5.8 (oz) as directed Prim Dressing: Xeroform 4x4-HBD (in/in) 1 x Per Day/15 Days ary Discharge Instructions: Apply Xeroform 4x4-HBD (in/in) as directed Secondary Dressing: Coverlet Latex-Free Fabric Adhesive Dressings 1 x Per Day/15 Days [name], [name] ([id number]) [id number] Page 4 of 8 Discharge Instructions: 1.5 x 2 Wound #7 - Ankle Wound Laterality: Left, Lateral Cleanser: Soap and Water 1 x Per Day/15 Days Discharge Instructions: Gently cleanse wound with antibacterial soap, rinse and pat dry prior to dressing wounds Cleanser: Vashe 5.8 (oz) 1 x Per Day/15 Days Discharge Instructions: Use vashe 5.8 (oz) as directed Prim Dressing: Xeroform 4x4-HBD (in/in) 1 x Per Day/15 Days ary Discharge Instructions: Apply Xeroform 4x4-HBD (in/in) as directed Secondary Dressing: Coverlet Latex-Free Fabric Adhesive Dressings 1 x Per Day/15 Days Discharge Instructions: 1.5 x 2 Electronic Signature(s) Signed: 09/18/2023 4:09:48 PM By: Demetria Pore Signed: 09/19/2023 4:50:41 PM By: Diane Aver MSN RN CNS WTA Signed: 09/20/2023 7:54:12 PM By: Diane Derry PA-C Entered  By: [name] on 09/18/2023 11:12:46 -------------------------------------------------------------------------------- Problem List Details Patient Name: Date of Service: [name] 09/18/2023 1:15 PM Medical Record Number: [id number] Patient Account Number: [id number] Date of Birth/Sex: Treating RN: Oct 06, 1946 (77 y.o. [name] Primary Care Provider: Eustaquio [name] Other Clinician: Referring Provider: Treating Provider/Extender: [name]: 7 Active Problems ICD-10 Encounter Code Description Active Date MDM Diagnosis I87.333 Chronic venous hypertension (idiopathic) with ulcer and inflammation of 07/26/2023 No Yes bilateral lower extremity L97.821 Non-pressure chronic ulcer of other part of left lower leg limited to breakdown 07/26/2023 No Yes of skin L97.818 Non-pressure chronic ulcer of other part of right lower leg with other specified 07/26/2023 No Yes severity L97.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 of right upper arm, subsequent encounter 07/26/2023 No Yes [name], [name] ([id number]) [id number] Page 5 of 8 Inactive Problems Resolved Problems Electronic Signature(s) Signed: 09/18/2023 1:10:57 PM By: Diane Derry PA-C Entered By: [name] on 09/18/2023 10:10:57 -------------------------------------------------------------------------------- Progress Note Details Patient Name: Date of Service: [name] 09/18/2023 1:15 PM Medical Record Number: [id number] Patient Account Number: [id number] Date of Birth/Sex: Treating RN: [birth year] (77 y.o. [name] Primary Care Provider: Eustaquio [name] Other Clinician: Referring Provider: Treating Provider/Extender: [name]: 7 Subjective Chief Complaint Information obtained from Patient 07/26/2023; new patient visit  for wounds on her bilateral lower legs and a more recent abrasion to her right forearm after a fall History of Present Illness (HPI) 07/26/2023 ADMISSION This is a 77 year old woman who has been referred for wounds on her bilateral lower legs by her primary physician. She was last seen in primary care on 06/15/2023 at which time she had an abrasion injury to the left anterior shin right medial leg and some scabbing over the left lateral malleolus. She also had bilateral heel issues although they were not open today. A culture of 1 of these wounds showed Pseudomonas I am not exactly sure what antibiotic she received. She had arterial studies done on 07/13/2023 that showed  an ABI on the right of 1.25 TBI 1.08 with biphasic and triphasic waveforms. On the left ABI I was 1.29 TBI at 0.92 and triphasic waveforms. All of this suggests that her arterial flow was quite good. The patient states that she has on and off wounds in her bilateral lower extremities for at least a year. The areas will heal and then reopen. She has been prescribed compression stockings in the past but she cannot get them on. She lives independently. A week ago she had a fall at home causing a skin tear on the right dorsal arm. 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 Her wounds are a lot better with simple compression of an Urgo K2 light. I think she is going to require ongoing compression stockings. She reminds me that she cannot get standard over the toe stockings on. We will look at external compression stockings The area on the right anterior arm we use Xeroform to last week. This apparently stuck to the wound she seemed particularly cross about this. She wants to continue to use triamcinolone and T on it. I did not argue this  point elfa 10/30; patient with severe chronic venous insufficiency in her bilateral lower legs. Her wounds have essentially close she does have some eschared areas but nothing is open. She has a single JuxtaLite stocking but we have prior authorization for a second juxta lite stocking. She cannot get standard stockings on her legs. She also had an abrasion on the right arm. This is still open. She has been using triamcinolone on this with a nonstick pad. We tried Vaseline gauze, Xeroform but according to the patient and the stuck to the wound bed. I wonder if the triamcinolone is actually inhibiting healing of this at this point. 09-18-2023 upon evaluation today patient appears to be doing well currently in regard to her wounds. She has been tolerating the dressing changes without complication. [address] [name], [name] ([id number]) [id number] Page 6 of 8 Constitutional Well-nourished and well-hydrated in no acute distress. Vitals Time Taken: 1:16 PM, Height: 63 in, Weight: 141.8 lbs, BMI: 25.1, Temperature: 98.0 F, Pulse: 76 bpm, Respiratory Rate: 16 breaths/min, Blood Pressure: 156/73 mmHg. Respiratory normal breathing without difficulty. Psychiatric this patient is able to make decisions and demonstrates good insight into disease process. Alert and Oriented x 3. pleasant and cooperative. General Notes: Upon inspection patient's wounds are currently showing signs of good granulation and epithelization at this point. Fortunately I do not see any signs of active infection at this time which is good news. Integumentary (Hair, Skin) Wound #4 status is Open. Original cause of wound was Skin T ear/Laceration. The date acquired was: 07/19/2023. The wound has been in [name] 7 weeks. The wound is located on the Right,Anterior Forearm. The wound measures 0.5cm length x 0.5cm width x 0.1cm depth; 0.196cm^2 area and 0.02cm^3 volume. There is Fat Layer (Subcutaneous  Tissue) exposed. There is a large amount of sanguinous drainage noted. There is medium (34-66%) red granulation within the wound bed. Wound #5 status is Open. Original cause of wound was Skin T ear/Laceration. The date acquired was: 09/10/2023. The wound is located on the Right Elbow. The wound measures 1cm length x 0.5cm width x 0.1cm depth; 0.393cm^2 area and 0.039cm^3 volume. There is Fat Layer (Subcutaneous Tissue) exposed. There is no tunneling or undermining noted. There is a medium amount of  serosanguineous drainage noted. There is medium (34-66%) red granulation within the wound bed. There is no necrotic tissue within the wound bed. Wound #6 status is Open. Original cause of wound was Skin T ear/Laceration. The date acquired was: 09/10/2023. The wound is located on the Right,Anterior Lower Leg. The wound measures 0.8cm length x 0.5cm width x 0.1cm depth; 0.314cm^2 area and 0.031cm^3 volume. There is Fat Layer (Subcutaneous Tissue) exposed. There is no tunneling or undermining noted. There is a medium amount of serosanguineous drainage noted. There is medium (34-66%) red granulation within the wound bed. Wound #7 status is Open. Original cause of wound was Pressure Injury. The date acquired was: 09/10/2023. The wound is located on the Left,Lateral Ankle. The wound measures 0.3cm length x 0.3cm width x 0.1cm depth; 0.071cm^2 area and 0.007cm^3 volume. There is a none present amount of drainage noted. There is medium (34-66%) pale granulation within the wound bed. There is no necrotic tissue within the wound bed. Assessment Active Problems ICD-10 Chronic venous hypertension (idiopathic) with ulcer and inflammation of bilateral lower extremity Non-pressure chronic ulcer of other part of left lower leg limited to breakdown of skin Non-pressure chronic ulcer of other part of right lower leg with other specified severity Non-pressure chronic ulcer of left ankle with other specified  severity Abrasion of right upper arm, subsequent encounter Plan Follow-up Appointments: Return Appointment in 1 week. Bathing/ Shower/ Hygiene: Wash wounds with antibacterial soap and water. May shower; gently cleanse wound with antibacterial soap, rinse and pat dry prior to dressing wounds No tub bath. Anesthetic (Use 'Patient Medications' Section for Anesthetic Order Entry): Lidocaine applied to wound bed Edema Control - Orders / Instructions: Patient to wear own Velcro compression garment. Remove compression stockings every night before going to bed and put on every morning when getting up. - Left lower leg Other: - Juxta Lite WOUND #4: - Forearm Wound Laterality: Right, Anterior Cleanser: Byram Ancillary Kit - 15 Day Supply (Generic) 1 x Per Day/30 Days Discharge Instructions: Use supplies as instructed; Kit contains: (15) Saline Bullets; (15) 3x3 Gauze; 15 pr Gloves Cleanser: Soap and Water 1 x Per Day/30 Days Discharge Instructions: Gently cleanse wound with antibacterial soap, rinse and pat dry prior to dressing wounds Topical: Triamcinolone Acetonide Cream, 0.1%, 15 (g) tube 1 x Per Day/30 Days Discharge Instructions: Apply as directed by provider. Secondary Dressing: telfa nonstick pad 1 x Per Day/30 Days Secured With: Coban Cohesive Bandage 4x5 (yds) Stretched 1 x Per Day/30 Days Discharge Instructions: Apply Coban as directed. WOUND #5: - Elbow Wound Laterality: Right Cleanser: Soap and Water 1 x Per Day/15 Days Discharge Instructions: Gently cleanse wound with antibacterial soap, rinse and pat dry prior to dressing wounds Cleanser: Vashe 5.8 (oz) 1 x Per Day/15 Days Discharge Instructions: Use vashe 5.8 (oz) as directed Prim Dressing: Xeroform 4x4-HBD (in/in) 1 x Per Day/15 Days ary Discharge Instructions: Apply Xeroform 4x4-HBD (in/in) as directed Secondary Dressing: Coverlet Latex-Free Fabric Adhesive Dressings 1 x Per Day/15 Days Discharge Instructions: 1.5 x 2 WOUND  #6: - Lower Leg Wound Laterality: Right, Anterior [name], [name] ([id number]) [id number] Page 7 of 8 Cleanser: Soap and Water 1 x Per Day/15 Days Discharge Instructions: Gently cleanse wound with antibacterial soap, rinse and pat dry prior to dressing wounds Cleanser: Vashe 5.8 (oz) 1 x Per Day/15 Days Discharge Instructions: Use vashe 5.8 (oz) as directed Prim Dressing: Xeroform 4x4-HBD (in/in) 1 x Per Day/15 Days ary Discharge Instructions: Apply Xeroform 4x4-HBD (in/in) as directed Secondary Dressing: Coverlet  Latex-Free Fabric Adhesive Dressings 1 x Per Day/15 Days Discharge Instructions: 1.5 x 2 WOUND #7: - Ankle Wound Laterality: Left, Lateral Cleanser: Soap and Water 1 x Per Day/15 Days Discharge Instructions: Gently cleanse wound with antibacterial soap, rinse and pat dry prior to dressing wounds Cleanser: Vashe 5.8 (oz) 1 x Per Day/15 Days Discharge Instructions: Use vashe 5.8 (oz) as directed Prim Dressing: Xeroform 4x4-HBD (in/in) 1 x Per Day/15 Days ary Discharge Instructions: Apply Xeroform 4x4-HBD (in/in) as directed Secondary Dressing: Coverlet Latex-Free Fabric Adhesive Dressings 1 x Per Day/15 Days Discharge Instructions: 1.5 x 2 1. I would recommend based on what we are seeing that we have the patient go ahead and initiate [name] with Xeroform gauze dressings. 2. I am also going to recommend based on what we are seeing that we have the patient continue to cover this with coverlets. We will see patient back for reevaluation in 1 week here in the clinic. If anything worsens or changes patient will contact our office for additional recommendations. Electronic Signature(s) Signed: 09/18/2023 5:26:30 PM By: Diane Derry PA-C Entered By: [name] on 09/18/2023 14:26:30 -------------------------------------------------------------------------------- SuperBill Details Patient Name: Date of Service: [name] 09/18/2023 Medical  Record Number: [id number] Patient Account Number: [id number] Date of Birth/Sex: Treating RN: June 16, 1946 (77 y.o. [name] Primary Care Provider: Eustaquio [name] Other Clinician: Referring Provider: Treating Provider/Extender: [name]: 7 Diagnosis Coding ICD-10 Codes Code Description [phone] Chronic venous hypertension (idiopathic) with ulcer and inflammation of bilateral lower extremity L97.821 Non-pressure chronic ulcer of other part of left lower leg limited to breakdown of skin L97.818 Non-pressure chronic ulcer of other part of right lower leg with other specified severity L97.328 Non-pressure chronic ulcer of left ankle with other specified severity S40.811D Abrasion of right upper arm, subsequent encounter Facility Procedures : CPT4 Code: [id number] Description: 27253 - WOUND CARE VISIT-LEV 5 EST PT Modifier: Quantity: 1 Physician Procedures : CPT4 Code Description Modifier [id number] - WC PHYS LEVEL 3 - EST PT ICD-10 Diagnosis Description I87.333 Chronic venous hypertension (idiopathic) with ulcer and inflammation of bilateral lower extremity L97.821 Non-pressure chronic ulcer of other  part of left lower leg limited to breakdown of skin [name], [name] ([id number]) [id number] P L97.818 Non-pressure chronic ulcer of other part of right lower leg with other specified severity L97.328 Non-pressure chronic  ulcer of left ankle with other specified severity Quantity: 1 age 63 of 7 Electronic Signature(s) Signed: 09/18/2023 5:26:48 PM By: Diane Derry PA-C Previous Signature: 09/18/2023 4:09:48 PM Version By: Demetria Pore Entered By: [name] on 09/18/2023 14:26:47

## 2023-09-18 NOTE — Progress Notes (Signed)
Stable cxr with chronic changes

## 2023-09-19 ENCOUNTER — Ambulatory Visit: Payer: Medicare Other

## 2023-09-19 DIAGNOSIS — M545 Low back pain, unspecified: Secondary | ICD-10-CM

## 2023-09-19 DIAGNOSIS — R2689 Other abnormalities of gait and mobility: Secondary | ICD-10-CM

## 2023-09-19 DIAGNOSIS — M6281 Muscle weakness (generalized): Secondary | ICD-10-CM

## 2023-09-19 DIAGNOSIS — R262 Difficulty in walking, not elsewhere classified: Secondary | ICD-10-CM

## 2023-09-19 DIAGNOSIS — R269 Unspecified abnormalities of gait and mobility: Secondary | ICD-10-CM | POA: Diagnosis not present

## 2023-09-19 NOTE — Therapy (Signed)
OUTPATIENT PHYSICAL THERAPY TREATMENT   Patient Name: Lichelle Herschberger MRN: [id number] DOB:1946/05/09, 77 y.o., female Today's Date: 09/19/2023   PCP:   [name], MD   REFERRING PROVIDER:   [name], MD    END OF SESSION:  PT End of Session - 09/19/23 1024     Visit Number 2    Number of Visits 24    Date for PT Re-Evaluation 12/08/23    Authorization Type [employer] Medicare    Progress Note Due on Visit 10    PT Start Time 1015    PT Stop Time 1055    PT Time Calculation (min) 40 min    Activity Tolerance Patient tolerated treatment well    Behavior During Therapy [facility] for tasks assessed/performed               Past Medical History:  Diagnosis Date   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 Known DISH with thoracic spine fusion and moderate kyphosis, h/o L spine fusion L2-5   Adjustment disorder with depressed mood 10/17/14   67 yo son died MI 09-25-14 Husband died car accident 03/27/2015   Anemia    Anticoagulant long-term use 06/14/2019   She takes warfarin for afib/flutter with CHADS2VASc score of 4 (age, sex, CHF, HTN), s/p 2 bioprosthetic valve replacements Coumadin changed to eliquis September 25, 2021 per cardiology   Aortic atherosclerosis 03/03/2022   Noted on 01/2022 chest CT   Arthritis    Arthropathy of spinal facet joint 03/08/2018   Ascending aortic aneurysm 03/02/2021   4.1cm on CT 01/2021 rec yearly monitoring   Bilateral hip pain 09/08/2016   Cancer (HCC)    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 Myelogram showed significant left sided foraminal stenosis at L2/3 and L3/4 in setting of solid arthrodesis, planned DEXA and if stable osteopenia, considering anterolateral decompression with spacer.    CKD (chronic kidney disease) stage 2, GFR 60-89 ml/min 07/03/2015   Constipation    takes Miralax daily as needed   COPD (chronic obstructive pulmonary disease)    Albuterol inhaler daily as needed;Duoneb daily as needed;Spiriva daily   COVID-19 virus infection 11/11/2022   DDD (degenerative disc disease)    cervical - kyphosis with mod DD changes C5/6 and C6/7; lumbar - early DD at L2/3 (Elsner)   Depression    DISH (diffuse idiopathic skeletal hyperostosis) 05/14/2019   By MRI noted fusion of lower thoracic vertebrae - likely contributing to unsteadiness (Elsner)   Dyspnea on exertion 06/05/2015   myoview low risk 02/2018   Edema of both lower legs due to peripheral venous insufficiency    Essential hypertension, benign 03-26-09   hx of not on nmeds currenlty and has been a while since on meds per pt   Heart  murmur    Hematuria 06/17/2021   History of kidney stones    History of radiation therapy 05/30/11 to 07/07/11   rectum   History of rectal cancer 05/26/2011   Rectal cancer 2012, midrectum ypT3ypN0, s/p neoadj chemo&XRT, lap LAR with diverting loop ileostomy 08/2011, ileostomy takedown 03/2012 Discharged from oncology clinic 2017 Truett Perna)   History of shingles    History of vertebral fracture 11/26/2019   Old L1 vertebral compression fracture incidentally noted on imaging 2020    Hyperlipemia    takes Lovastatin daily   Lacunar infarction    Left caudate   Left hip pain 07/29/2020   Legally blind in  left eye    Lymphangioma 01/07/2021   Noted incidentally on carotid US 12/2020 CT neck - Likely benign lymphangioma 01/2021   Macrocytosis 03/27/2022   Folate and b12 checked 2023   Malaise and fatigue 01/22/2018   Meralgia paresthetica 12/13/2007   Mixed stress and urge urinary incontinence 03/16/2015   Failed oxybutynin IR/ER. Vesicare initially helped but then no longer helpful.  myrbetriq was not effective.  Saw urology (MacDiarmid) - UDS largely overactive bladder with mild-mod stress incontinence. Improved on abx course - maintained on [place].    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   Pulmonary infiltrates 04/23/2019   06/13/2018-CT super D chest without contrast- generally stable chronic lung disease with pleural parenchymal scarring and scattered nodularity most of these nodules have been tree-in-bud in appearance likely postinfectious or inflammatory the dominant right lower lobe nodule seen on most recent study has resolved no new or enlarging nodules   04/22/2019-CT chest without contrast- waxing and waning per   Pulmonary nodule 02/23/2012   RLL nodule-25mm stable 2006, April 2009, and June 2009 Small pulm nodules Jan 2012 - > Oct 2012 without change   Rheumatic heart disease mitral stenosis    mod MS by echo 02/2014   Right leg swelling 06/11/2020   R venous US 06/2020 WNL: - No evidence of deep vein thrombosis in the lower extremity. No indirect evidence of obstruction proximal to the inguinal ligament. - No cystic structure found in the popliteal fossa.   S/P aortic and mitral valve bioprostheses 12/08/2013   AVR 21 mm, MVR 25 mm-MagnaEase pericardia   S/P AVR (aortic valve replacement) 2015   bioprosthetic (Bartle)   S/P MVR (mitral valve replacement) 2015   bioprosthetic (Bartle)   Sepsis secondary to UTI (HCC) 08/22/2017   Severe aortic valve stenosis 10/01/2013   mild-mod by echo 02/2014   Skin lesion 01/03/2022   Small bowel obstruction, partial 08/2013   reolved without NGT placement.  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   Dr Amy [place]   Syncope 05/28/2018   Thrombocytopenia 11/04/2019   Typical atrial flutter 05/15/2019   Vitamin B12 deficiency 02/28/2015   Start B12 shots 02/2015    Vitamin D deficiency 10/23/2016   Wounds, multiple    bilateral legs   Past Surgical History:  Procedure Laterality Date   ANTERIOR LAT LUMBAR FUSION Left 07/17/2018   Procedure: Lumbar Two-Three Lumbar Three-Four Anterolateral decompression/interbody fusion with lateral plate fixation/Infuse;  Surgeon: Barnett Abu, MD;  Location: [facility] OR;  Service: Neurosurgery;  Laterality: Left;  Lumbar Two-Three Lumbar Three-Four Anterolateral decompression/interbody fusion with lateral plate fixation/Infuse   AORTIC VALVE REPLACEMENT N/A 12/12/2013   Procedure: AORTIC VALVE REPLACEMENT (AVR);  Surgeon: Alleen Borne, MD; Service: Open Heart Surgery   BACK SURGERY  2006, 2007   2006 SPACER, 2007 decompression and fusion L4/5    BOWEL RESECTION  04/02/2012   Procedure: SMALL BOWEL RESECTION;  Surgeon: [name], MD;  Location: [facility] ORS;  Service: General;  Laterality: N/A;   BRONCHIAL WASHINGS  03/24/2021   Procedure: BRONCHIAL WASHINGS;  Surgeon: [name], MD;  Location: [facility] ENDOSCOPY;  Service: Endoscopy;;   CARDIOVERSION N/A 08/29/2023   Procedure: CARDIOVERSION (CATH LAB);  Surgeon: Lewayne Bunting, MD;  Location: [facility] INVASIVE CV LAB;  Service: Cardiovascular;  Laterality: N/A;   CARPAL TUNNEL RELEASE Bilateral    CATARACT EXTRACTION W/ INTRAOCULAR LENS IMPLANT Left 10/18/2016   CATARACT EXTRACTION W/ INTRAOCULAR LENS IMPLANT Right 12/13/2016   Dr. Dagoberto Ligas   CHOLECYSTECTOMY N/A 01/21/2015   chronic cholecystitis, [name], MD   COLON RESECTION  08/25/2011   Procedure: COLON RESECTION LAPAROSCOPIC;  Surgeon: [name], MD;  Location: [facility]S;  Service: General;  Laterality: N/A;  Laparoscopic Assisted Low Anterior Resection Diverting Ostomy and onQ pain pump   COLONOSCOPY  03/2013   1 polyp, rpt 3 yrs Christella Hartigan)   COLONOSCOPY  05/2021   diverticulosis and patent colon anastomosis with some edema/narrowing, rpt 5 yrs Christella Hartigan)   COLONOSCOPY WITH PROPOFOL N/A 06/09/2016   patent colo-colonic anastomosis, rpt 5 yrs Christella Hartigan)   CYSTOSCOPY/URETEROSCOPY/HOLMIUM LASER/STENT PLACEMENT Right 07/03/2023   Procedure: CYSTOSCOPY RIGHT RETROGRADE PYEPLGRAM RIGHT URETEROSCOPY/HOLMIUM LASER/STENT PLACEMENT;  Surgeon: Crista Elliot, MD;  Location: [facility];  Service: Urology;  Laterality: Right;  1 HR FOR CASE   CYSTOSCOPY/URETEROSCOPY/HOLMIUM LASER/STENT PLACEMENT Right 07/17/2023   Procedure: CYSTOSCOPY/RIGHT URETEROSCOPY/STENT EXCHANGE;  Surgeon: Crista Elliot, MD;  Location: [facility];  Service: Urology;  Laterality: Right;  60 MINS FOR CASE   EP IMPLANTABLE DEVICE N/A 06/16/2016   Procedure: Pacemaker Implant;  Surgeon: Will Jorja Loa, MD;  Location: [facility] INVASIVE CV LAB;  Service: Cardiovascular;   Laterality: N/A;   FOOT SURGERY  left foot   hammer toe   ILEOSTOMY  08/25/2011   ILEOSTOMY CLOSURE  04/02/2012   Procedure: ILEOSTOMY TAKEDOWN;  Surgeon: [name], MD;  Location: [facility];  Service: General;  Laterality: N/A;   INTRAOPERATIVE TRANSESOPHAGEAL ECHOCARDIOGRAM N/A 12/12/2013   Procedure: INTRAOPERATIVE TRANSESOPHAGEAL ECHOCARDIOGRAM;  Surgeon: Alleen Borne, MD;  Location: [facility] OR;  Service: Open Heart Surgery;  Laterality: N/A;   LEFT AND RIGHT HEART CATHETERIZATION WITH CORONARY ANGIOGRAM N/A 11/27/2013   Procedure: LEFT AND RIGHT HEART CATHETERIZATION WITH CORONARY ANGIOGRAM;  Surgeon: Thurmon Fair, MD;  Location: [facility] CATH LAB;  Service: Cardiovascular;  Laterality: N/A;   MITRAL VALVE REPLACEMENT N/A 12/12/2013   Procedure: MITRAL VALVE (MV) REPLACEMENT OR REPAIR;  Surgeon: Alleen Borne, MD; Service: Open Heart Surgery   TEE WITHOUT CARDIOVERSION N/A 11/29/2013   Procedure: TRANSESOPHAGEAL ECHOCARDIOGRAM (TEE);  Surgeon: Quintella Reichert, MD;  Location: [facility] ENDOSCOPY;  Service: Cardiovascular;  Laterality: N/A;   TEE WITHOUT CARDIOVERSION N/A 06/04/2019   Procedure: TRANSESOPHAGEAL ECHOCARDIOGRAM (TEE);  Surgeon: Thurmon Fair, MD;  Location: [facility] ENDOSCOPY;  Service: Cardiovascular;  Laterality: N/A;   TONSILLECTOMY     TRANSESOPHAGEAL ECHOCARDIOGRAM (CATH LAB) N/A 08/29/2023   Procedure: TRANSESOPHAGEAL ECHOCARDIOGRAM;  Surgeon: Lewayne Bunting, MD;  Location: [facility] INVASIVE CV LAB;  Service: Cardiovascular;  Laterality: N/A;   VIDEO BRONCHOSCOPY N/A 03/24/2021   Procedure: VIDEO BRONCHOSCOPY WITHOUT FLUORO;  Surgeon: [name], MD;  Location: [facility] ENDOS[facility];  Service: Endoscopy;  Laterality: N/A;  SMALL SCOPE,PREMEDICATE WITH NEBULIZED LIDOCAINE   Patient Active Problem List   Diagnosis Date Noted   Pressure sore on heel 06/15/2023   Lump of skin 06/06/2023   BRBPR (bright red blood per rectum) 04/24/2023   Head injury 04/24/2023   Cause of injury, fall  03/13/2023   Imbalance 03/13/2023   Venous ulcer of leg (HCC) 01/24/2023   Fatigue 10/05/2022   Positive ANA (antinuclear antibody) 05/30/2022   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   Lacunar infarction (HCC)    Aortic atherosclerosis 03/03/2022   Peripheral neuropathy 01/03/2022   Hematuria 06/17/2021   Pulmonary artery hypertension 06/17/2021   Pacemaker 03/02/2021   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   Ascending aortic aneurysm (HCC) 03/02/2021   Lymphangioma 01/07/2021   Persistent atrial fibrillation 12/09/2020   Carotid stenosis 12/08/2020   Left hip pain 07/29/2020   Right leg swelling 06/11/2020   History of vertebral fracture 11/26/2019   Acute midline low back pain without sciatica 11/26/2019   Thrombocytopenia 11/04/2019   Anticoagulant long-term use 06/14/2019   Atrial flutter (HCC) 05/15/2019   DISH (diffuse idiopathic skeletal hyperostosis) 05/14/2019   Pulmonary infiltrates 04/23/2019   Other spondylosis with radiculopathy, lumbar region 07/17/2018   Syncope 05/28/2018   Arthropathy of spinal facet joint 03/08/2018   Malaise and fatigue 01/22/2018   Pain of right sacroiliac joint 08/09/2017   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 12/31/2016   Vitamin D deficiency 10/23/2016   Bilateral hip pain 09/08/2016   History of colonic polyps    Pedal edema 02/25/2016   Advanced care planning/counseling discussion 10/28/2015   CKD (chronic kidney disease) stage 2, GFR 60-89 ml/min 07/03/2015   Dyspnea on exertion 06/05/2015   Mixed stress and urge urinary incontinence 03/16/2015   Vitamin B12 deficiency 02/28/2015   Chronic insomnia 02/25/2015   COPD exacerbation (HCC) 12/11/2014   Osteoporosis 12/09/2014   Medicare annual wellness visit, subsequent 10/15/2014   Health maintenance examination 10/15/2014   Adjustment disorder with mixed anxiety and depressed mood 10/15/2014   Chronic diastolic heart failure 09/19/2014   Chest pain 08/25/2014   Mitral valve  replaced 12/2013   Acute on chronic heart failure with preserved ejection fraction (HFpEF, >= 50%) (HCC) 10/10/2013   Pulmonary nodules 02/23/2012   History of radiation therapy    Macrocytic anemia 09/21/2011   History of rectal cancer 05/26/2011   Essential hypertension, benign 03/24/2009   Overweight (BMI 25.0-29.9) 03/05/2008   Chronic lower back pain 03/05/2008   Meralgia paresthetica 12/13/2007   GERD (gastroesophageal reflux disease) 08/06/2007   HLD (hyperlipidemia) 05/03/2007   COPD (chronic obstructive pulmonary disease) 05/03/2007    ONSET DATE: 06/10/22  REFERRING DIAG:  M54.50,G89.29 (ICD-10-CM) - Chronic midline low back pain without sciatica  R26.89 (  ICD-10-CM) - Imbalance    THERAPY DIAG:  Abnormality of gait and mobility  Other abnormalities of gait and mobility  Difficulty in walking, not elsewhere classified  Muscle weakness (generalized)  Low back pain without sciatica, unspecified back pain laterality, unspecified chronicity  Rationale for Evaluation and Treatment: Rehabilitation  SUBJECTIVE:                                                                                                                                                                                             SUBJECTIVE STATEMENT: Pt continues to struggle with energy and tolerance to standing. Her back pain is very limiting when up and moving. Pt not working on HEP right now, none given per her report. She has 2 visits remaining.    Pt accompanied by: self  PERTINENT HISTORY: Patient has a long history of low back pain, arthritis, COPD, hypertension, compression fracture, hyperlipidemia, visual deficits in the left eye  From recent hospitalization:  77 y.o. female with a history of second-degree AV block, anxiety, depression, atrial fibrillation, PUD, aortic arteriosclerosis, ascending aortic aneurysm, diastolic heart failure. Patient presented secondary to shortness of breath and  found to have evidence of acute diastolic heart failure in addition to COPD exacerbation. Heart failure complicated by high burden atrial fibrillation/flutter. Cardiology consulted. Patient diuresed and given steroids/breathing treatments with improvement of symptoms. Patient underwent a Transesophageal Echocardiogram with cardioversion per cardiology recommendations prior to discharge.     From Eval: Pt reports with her balance she is falling in scenarios where she begins to feel off balance then she falls. She had a fall when putting out round up and other loss of balance when she was repotting a plant and lost her balance.  Both of her fall she noted were in flexed posture for prolonged period likely resulting in inability to perform adequate extension to get out of this posture resulting in fall.  Pt also has low back pain from " disc fragments" that are in her back. The pain worsens with longer standing. Pt low back pain for years. She cannot go into extension and cannot lay on her stomach.  PAIN:  Are you having pain? Yes: NPRS scale: 3/10 Pain location: low back across both sides.  Pain description: Dull, aching Aggravating factors: standing, walking Relieving factors: sitting ( flexed postures)  PRECAUTIONS: Fall  RED FLAGS: None   WEIGHT BEARING RESTRICTIONS: No  FALLS: Has patient fallen in last 6 months? Yes. Number of falls 2  LIVING ENVIRONMENT: Lives with: lives with their family Lives in: House/apartment Stairs: Yes: Internal: 3 steps; can reach both Has following equipment at home:  Single point cane, quad cane  PLOF: Independent with basic ADLs  PATIENT GOALS: Improve balance, decrease falls, improve her back pain   OBJECTIVE:   DIAGNOSTIC FINDINGS: From MRI of lumbar spine in June 2024 interpreted by radiologist and impression as below  IMPRESSION: 1. No acute fracture or acute finding. 2. Chronic compression fracture of L1 stable from the prior lumbar spine  CT. 3. Postoperative changes from fusions, L2-L3, L3-L4 and L4-L5, also stable from the prior C  COGNITION: Overall cognitive status: Within functional limits for tasks assessed   SENSATION: Not tested   POSTURE: rounded shoulders, decreased lumbar lordosis, increased thoracic kyphosis, and flexed trunk   LOWER EXTREMITY ROM:     MMT Right Eval Left Eval  Hip flexion 4- 4-  Hip extension 4- 4-  Hip abduction 4 4  Hip adduction 4+ 4+  Hip internal rotation    Hip external rotation    Knee flexion 4 4-  Knee extension 5 4+  Ankle dorsiflexion 4 4  Ankle plantarflexion    Ankle inversion    Ankle eversion     (Blank rows = not tested)   (Blank rows = not tested)  BED MOBILITY:  Pt does not sleep in the bed, currently sleeps in recliner  TRANSFERS: Assistive device utilized: Single point cane  Sit to stand: Modified independence Stand to sit: Modified independence Chair to chair: Modified independence Floor:  does not attempt   STAIRS: Level of Assistance: SBA Stair Negotiation Technique: Alternating Pattern  with Bilateral Rails Number of Stairs: 4  Height of Stairs: 6 in  Comments:   GAIT: Gait pattern: decreased arm swing- Right, decreased arm swing- Left, decreased step length- Right, decreased step length- Left, decreased stance time- Right, decreased stance time- Left, and trunk flexed Distance walked: 80 ft Assistive device utilized: Quad cane small base Level of assistance: Modified independence Comments: flexed posture improved with use of rollator but UE fatigued with use of rollator.   FUNCTIONAL TESTS:  See goal assessment for updated outcome measure assessment.     PATIENT SURVEYS:  FOTO 46 RAG of 53  TODAY'S TREATMENT:         107/67 85bpm  SPO2:  AA/ROM Nustep: seat 9, arms 9, level 1, takes a dyspnea break   Reclined on plinth 30 degrees -bilat isometric shoulder extension into table 15x3secH  -pillow crushers x15 -wand flexion  x15 -yellow TB row x15 -bilat isometric shoulder extension into table 15x3secH  -pillow crushers x15 -wand flexion x15 -yellow TB row x15  At edge of plinth short sitting  -STS from elevtaed surface x5 -seated marching 2x10 bilat  -STS from elevtaed surface x5  PATIENT EDUCATION: Education details: POC Person educated: Patient Education method: Explanation Education comprehension: verbalized understanding  HOME EXERCISE PROGRAM: Establish visit 2   GOALS: Goals reviewed with patient? Yes  SHORT TERM GOALS: Target date: 10/20/2023    Patient will be independent in home exercise program to improve strength/mobility for better functional independence with ADLs. Baseline: No HEP currently  Goal status: INITIAL   LONG TERM GOALS: Target date: 12/08/2023  1.  Patient (> 24 years old) will complete five times sit to stand test in < 15 seconds indicating an increased LE strength and improved balance. Baseline: 17 sec with UE assist  12/6: adjusted baseline: 18.1sec  Goal status: INITIAL  2.  Patient will increase FOTO score to equal to or greater than  53   to demonstrate statistically significant improvement in mobility and  quality of life.  Baseline: 47 12/6 adjusted baseline: 46  Goal status: INITIAL   3.   Patient will reduce timed up and go to <11 seconds to reduce fall risk and demonstrate improved transfer/gait ability. Baseline: 14 sec, [place] use for STS  12/6: adjusted baseline: 18.5 with UE support and SPC  Goal status:  INITIAL    4.   Patient will increase 10 meter walk test to >0.5m/s as to improve gait speed for better community ambulation and to reduce fall risk. Baseline: .675 m/s no AD  12/6: adjusted baseline: .639m/s Goal status: INITIAL Adjusted:   5.   Patient will increase six minute walk test distance to >500 without desat <92% or increased in back pain for progression to community ambulator and improve gait ability Baseline: 450 ft with clinic  rollator to offset low back pain  12/6: 36ft with desat to 91%  Goal status: INITIAL Adjusted:   6. Pt will improve Berg balance Scale score by 6 points to indicate reduced fall risk and improved ability to access community.  Baseline: to be completed at next visit Goal status: new. Initial       ASSESSMENT:      CLINICAL IMPRESSION:  Pain remains a large factor, a big demotivator. Took time to adapt interventions to a more pain tolerated level. Pt does well in general, gives helpful feedback with each new intervention. HEP setup pending.  Pt will continue to benefit from skilled physical therapy intervention to address impairments, improve QOL, and attain therapy goals.    OBJECTIVE IMPAIRMENTS: Abnormal gait, decreased activity tolerance, decreased balance, decreased endurance, decreased mobility, difficulty walking, decreased ROM, decreased strength, hypomobility, and pain.   ACTIVITY LIMITATIONS: carrying, lifting, standing, sleeping, stairs, transfers, and locomotion level  PARTICIPATION LIMITATIONS: driving, community activity, and yard work  PERSONAL FACTORS: Age, Fitness, Time since onset of injury/illness/exacerbation, and 3+ comorbidities:  arthritis, COPD, hypertension, compression fracture, hyperlipidemia, visual deficits in the left eye  are also affecting patient's functional outcome.   REHAB POTENTIAL: Good  CLINICAL DECISION MAKING: Stable/uncomplicated  EVALUATION COMPLEXITY: Low  PLAN:  PT FREQUENCY: 2x/week  PT DURATION: 12 weeks  PLANNED INTERVENTIONS: Therapeutic exercises, Therapeutic activity, Neuromuscular re-education, Balance training, Gait training, Patient/Family education, Self Care, Joint mobilization, Joint manipulation, Stair training, Dry Needling, and Manual therapy  PLAN FOR NEXT SESSION:   HEP, BERG. UE strength for walker use, postural strengthening ( post chain), balance   10:44 AM, 09/19/23 Rosamaria Lints, PT, DPT Physical  Therapist - [facility]  Outpatient Physical Therapy- Main Campus [phone]

## 2023-09-22 ENCOUNTER — Ambulatory Visit: Payer: Medicare Other | Admitting: Physical Therapy

## 2023-09-22 DIAGNOSIS — R262 Difficulty in walking, not elsewhere classified: Secondary | ICD-10-CM

## 2023-09-22 DIAGNOSIS — M6281 Muscle weakness (generalized): Secondary | ICD-10-CM

## 2023-09-22 DIAGNOSIS — R269 Unspecified abnormalities of gait and mobility: Secondary | ICD-10-CM

## 2023-09-22 DIAGNOSIS — R2689 Other abnormalities of gait and mobility: Secondary | ICD-10-CM

## 2023-09-22 DIAGNOSIS — M545 Low back pain, unspecified: Secondary | ICD-10-CM

## 2023-09-22 NOTE — Therapy (Signed)
OUTPATIENT PHYSICAL THERAPY TREATMENT   Patient Name: [name] MRN: [id number] DOB:09/26/1946, 77 y.o., female Today's Date: 09/22/2023   PCP:   [name], MD   REFERRING PROVIDER:   [name], MD    END OF SESSION:  PT End of Session - 09/22/23 1017     Visit Number 3    Number of Visits 24    Date for PT Re-Evaluation 12/08/23    Authorization Type [employer] Medicare    Progress Note Due on Visit 10    PT Start Time 1018    PT Stop Time 1100    PT Time Calculation (min) 42 min    Activity Tolerance Patient tolerated treatment well    Behavior During Therapy [facility] for tasks assessed/performed               Past Medical History:  Diagnosis Date   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 Known DISH with thoracic spine fusion and moderate kyphosis, h/o L spine fusion L2-5   Adjustment disorder with depressed mood 2014/11/01   58 yo son died MI Oct 18, 2014 Husband died car accident 04-19-15   Anemia    Anticoagulant long-term use 06/14/2019   She takes warfarin for afib/flutter with CHADS2VASc score of 4 (age, sex, CHF, HTN), s/p 2 bioprosthetic valve replacements Coumadin changed to eliquis 10/18/2021 per cardiology   Aortic atherosclerosis 03/03/2022   Noted on 01/2022 chest CT   Arthritis    Arthropathy of spinal facet joint 03/08/2018   Ascending aortic aneurysm 03/02/2021   4.1cm on CT 01/2021 rec yearly monitoring   Bilateral hip pain 09/08/2016   Cancer (HCC)    conon cancer 10-19-2011   CAP (community acquired pneumonia) 02/08/2016   Carotid stenosis 12/08/2020   Carotid US - 40-59% BICA stenosis (2015) Carotid US 12/2020 - 1-39% BICA stenosis, incidental 3.4cm structure behind L common carotid artery rec further imaging    Cataracts, bilateral    immature   Chest  pain 08/25/2014   myoview low risk 02/2018 ([place]itz)   Chronic cholecystitis with calculus 01/21/2015   S/p cholecystectomy    Chronic diastolic heart failure 09/19/2014   Chronic insomnia    Chronic lower back pain 03/05/2008   Returned to Dr Danielle Dess - translaminar ESI at L3/4. Myelogram showed significant left sided foraminal stenosis at L2/3 and L3/4 in setting of solid arthrodesis, planned DEXA and if stable osteopenia, considering anterolateral decompression with spacer.    CKD (chronic kidney disease) stage 2, GFR 60-89 ml/min 07/03/2015   Constipation    takes Miralax daily as needed   COPD (chronic obstructive pulmonary disease)    Albuterol inhaler daily as needed;Duoneb daily as needed;Spiriva daily   COVID-19 virus infection 11/11/2022   DDD (degenerative disc disease)    cervical - kyphosis with mod DD changes C5/6 and C6/7; lumbar - early DD at L2/3 (Elsner)   Depression    DISH (diffuse idiopathic skeletal hyperostosis) 05/14/2019   By MRI noted fusion of lower thoracic vertebrae - likely contributing to unsteadiness (Elsner)   Dyspnea on exertion 06/05/2015   myoview low risk 02/2018   Edema of both lower legs due to peripheral venous insufficiency    Essential hypertension, benign 03/24/2009   hx of not on nmeds currenlty and has been a while since on meds per pt   Heart  murmur    Hematuria 06/17/2021   History of kidney stones    History of radiation therapy 05/30/11 to 07/07/11   rectum   History of rectal cancer 05/26/2011   Rectal cancer 2012, midrectum ypT3ypN0, s/p neoadj chemo&XRT, lap LAR with diverting loop ileostomy 08/2011, ileostomy takedown 03/2012 Discharged from oncology clinic 2017 Truett Perna)   History of shingles    History of vertebral fracture 11/26/2019   Old L1 vertebral compression fracture incidentally noted on imaging 2020    Hyperlipemia    takes Lovastatin daily   Lacunar infarction    Left caudate   Left hip pain 07/29/2020   Legally blind in  left eye    Lymphangioma 01/07/2021   Noted incidentally on carotid US 12/2020 CT neck - Likely benign lymphangioma 01/2021   Macrocytosis 03/27/2022   Folate and b12 checked 2023   Malaise and fatigue 01/22/2018   Meralgia paresthetica 12/13/2007   Mixed stress and urge urinary incontinence 03/16/2015   Failed oxybutynin IR/ER. Vesicare initially helped but then no longer helpful.  myrbetriq was not effective.  Saw urology (MacDiarmid) - UDS largely overactive bladder with mild-mod stress incontinence. Improved on abx course - maintained on [place].    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   Pulmonary infiltrates 04/23/2019   06/13/2018-CT super D chest without contrast- generally stable chronic lung disease with pleural parenchymal scarring and scattered nodularity most of these nodules have been tree-in-bud in appearance likely postinfectious or inflammatory the dominant right lower lobe nodule seen on most recent study has resolved no new or enlarging nodules   04/22/2019-CT chest without contrast- waxing and waning per   Pulmonary nodule 02/23/2012   RLL nodule-65mm stable 2006, April 2009, and June 2009 Small pulm nodules Jan 2012 - > Oct 2012 without change   Rheumatic heart disease mitral stenosis    mod MS by echo 02/2014   Right leg swelling 06/11/2020   R venous US 06/2020 WNL: - No evidence of deep vein thrombosis in the lower extremity. No indirect evidence of obstruction proximal to the inguinal ligament. - No cystic structure found in the popliteal fossa.   S/P aortic and mitral valve bioprostheses 12/08/2013   AVR 21 mm, MVR 25 mm-MagnaEase pericardia   S/P AVR (aortic valve replacement) 2015   bioprosthetic (Bartle)   S/P MVR (mitral valve replacement) 2015   bioprosthetic (Bartle)   Sepsis secondary to UTI (HCC) 08/22/2017   Severe aortic valve stenosis 10/01/2013   mild-mod by echo 02/2014   Skin lesion 01/03/2022   Small bowel obstruction, partial 08/2013   reolved without NGT placement.  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   Dr Amy [place]   Syncope 05/28/2018   Thrombocytopenia 11/04/2019   Typical atrial flutter 05/15/2019   Vitamin B12 deficiency 02/28/2015   Start B12 shots 02/2015    Vitamin D deficiency 10/23/2016   Wounds, multiple    bilateral legs   Past Surgical History:  Procedure Laterality Date   ANTERIOR LAT LUMBAR FUSION Left 07/17/2018   Procedure: Lumbar Two-Three Lumbar Three-Four Anterolateral decompression/interbody fusion with lateral plate fixation/Infuse;  Surgeon: Barnett Abu, MD;  Location: [facility] OR;  Service: Neurosurgery;  Laterality: Left;  Lumbar Two-Three Lumbar Three-Four Anterolateral decompression/interbody fusion with lateral plate fixation/Infuse   AORTIC VALVE REPLACEMENT N/A 12/12/2013   Procedure: AORTIC VALVE REPLACEMENT (AVR);  Surgeon: Alleen Borne, MD; Service: Open Heart Surgery   BACK SURGERY  2006, 2007   2006 SPACER, 2007 decompression and fusion L4/5    BOWEL RESECTION  04/02/2012   Procedure: SMALL BOWEL RESECTION;  Surgeon: [name], MD;  Location: [facility] ORS;  Service: General;  Laterality: N/A;   BRONCHIAL WASHINGS  03/24/2021   Procedure: BRONCHIAL WASHINGS;  Surgeon: [name], MD;  Location: [facility] ENDOSCOPY;  Service: Endoscopy;;   CARDIOVERSION N/A 08/29/2023   Procedure: CARDIOVERSION (CATH LAB);  Surgeon: Lewayne Bunting, MD;  Location: [facility] INVASIVE CV LAB;  Service: Cardiovascular;  Laterality: N/A;   CARPAL TUNNEL RELEASE Bilateral    CATARACT EXTRACTION W/ INTRAOCULAR LENS IMPLANT Left 10/18/2016   CATARACT EXTRACTION W/ INTRAOCULAR LENS IMPLANT Right 12/13/2016   Dr. Dagoberto Ligas   CHOLECYSTECTOMY N/A 01/21/2015   chronic cholecystitis, [name], MD   COLON RESECTION  08/25/2011   Procedure: COLON RESECTION LAPAROSCOPIC;  Surgeon: [name], MD;  Location: [facility]S;  Service: General;  Laterality: N/A;  Laparoscopic Assisted Low Anterior Resection Diverting Ostomy and onQ pain pump   COLONOSCOPY  03/2013   1 polyp, rpt 3 yrs Christella Hartigan)   COLONOSCOPY  05/2021   diverticulosis and patent colon anastomosis with some edema/narrowing, rpt 5 yrs Christella Hartigan)   COLONOSCOPY WITH PROPOFOL N/A 06/09/2016   patent colo-colonic anastomosis, rpt 5 yrs Christella Hartigan)   CYSTOSCOPY/URETEROSCOPY/HOLMIUM LASER/STENT PLACEMENT Right 07/03/2023   Procedure: CYSTOSCOPY RIGHT RETROGRADE PYEPLGRAM RIGHT URETEROSCOPY/HOLMIUM LASER/STENT PLACEMENT;  Surgeon: Crista Elliot, MD;  Location: [facility];  Service: Urology;  Laterality: Right;  1 HR FOR CASE   CYSTOSCOPY/URETEROSCOPY/HOLMIUM LASER/STENT PLACEMENT Right 07/17/2023   Procedure: CYSTOSCOPY/RIGHT URETEROSCOPY/STENT EXCHANGE;  Surgeon: Crista Elliot, MD;  Location: [facility];  Service: Urology;  Laterality: Right;  60 MINS FOR CASE   EP IMPLANTABLE DEVICE N/A 06/16/2016   Procedure: Pacemaker Implant;  Surgeon: Will Jorja Loa, MD;  Location: [facility] INVASIVE CV LAB;  Service: Cardiovascular;   Laterality: N/A;   FOOT SURGERY  left foot   hammer toe   ILEOSTOMY  08/25/2011   ILEOSTOMY CLOSURE  04/02/2012   Procedure: ILEOSTOMY TAKEDOWN;  Surgeon: [name], MD;  Location: [facility]S;  Service: General;  Laterality: N/A;   INTRAOPERATIVE TRANSESOPHAGEAL ECHOCARDIOGRAM N/A 12/12/2013   Procedure: INTRAOPERATIVE TRANSESOPHAGEAL ECHOCARDIOGRAM;  Surgeon: Alleen Borne, MD;  Location: [facility] OR;  Service: Open Heart Surgery;  Laterality: N/A;   LEFT AND RIGHT HEART CATHETERIZATION WITH CORONARY ANGIOGRAM N/A 11/27/2013   Procedure: LEFT AND RIGHT HEART CATHETERIZATION WITH CORONARY ANGIOGRAM;  Surgeon: Thurmon Fair, MD;  Location: [facility] CATH LAB;  Service: Cardiovascular;  Laterality: N/A;   MITRAL VALVE REPLACEMENT N/A 12/12/2013   Procedure: MITRAL VALVE (MV) REPLACEMENT OR REPAIR;  Surgeon: Alleen Borne, MD; Service: Open Heart Surgery   TEE WITHOUT CARDIOVERSION N/A 11/29/2013   Procedure: TRANSESOPHAGEAL ECHOCARDIOGRAM (TEE);  Surgeon: Quintella Reichert, MD;  Location: [facility] ENDOSCOPY;  Service: Cardiovascular;  Laterality: N/A;   TEE WITHOUT CARDIOVERSION N/A 06/04/2019   Procedure: TRANSESOPHAGEAL ECHOCARDIOGRAM (TEE);  Surgeon: Thurmon Fair, MD;  Location: [facility] ENDOSCOPY;  Service: Cardiovascular;  Laterality: N/A;   TONSILLECTOMY     TRANSESOPHAGEAL ECHOCARDIOGRAM (CATH LAB) N/A 08/29/2023   Procedure: TRANSESOPHAGEAL ECHOCARDIOGRAM;  Surgeon: Lewayne Bunting, MD;  Location: [facility] INVASIVE CV LAB;  Service: Cardiovascular;  Laterality: N/A;   VIDEO BRONCHOSCOPY N/A 03/24/2021   Procedure: VIDEO BRONCHOSCOPY WITHOUT FLUORO;  Surgeon: [name], MD;  Location: [facility] ENDOSCOPY;  Service: Endoscopy;  Laterality: N/A;  SMALL SCOPE,PREMEDICATE WITH NEBULIZED LIDOCAINE   Patient Active Problem List   Diagnosis Date Noted   Pressure sore on heel 06/15/2023   Lump of skin 06/06/2023   BRBPR (bright red blood per rectum) 04/24/2023   Head injury 04/24/2023   Cause of injury, fall  03/13/2023   Imbalance 03/13/2023   Venous ulcer of leg (HCC) 01/24/2023   Fatigue 10/05/2022   Positive ANA (antinuclear antibody) 05/30/2022   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   Lacunar infarction (HCC)    Aortic atherosclerosis 03/03/2022   Peripheral neuropathy 01/03/2022   Hematuria 06/17/2021   Pulmonary artery hypertension 06/17/2021   Pacemaker 03/02/2021   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   Ascending aortic aneurysm (HCC) 03/02/2021   Lymphangioma 01/07/2021   Persistent atrial fibrillation 12/09/2020   Carotid stenosis 12/08/2020   Left hip pain 07/29/2020   Right leg swelling 06/11/2020   History of vertebral fracture 11/26/2019   Acute midline low back pain without sciatica 11/26/2019   Thrombocytopenia 11/04/2019   Anticoagulant long-term use 06/14/2019   Atrial flutter (HCC) 05/15/2019   DISH (diffuse idiopathic skeletal hyperostosis) 05/14/2019   Pulmonary infiltrates 04/23/2019   Other spondylosis with radiculopathy, lumbar region 07/17/2018   Syncope 05/28/2018   Arthropathy of spinal facet joint 03/08/2018   Malaise and fatigue 01/22/2018   Pain of right sacroiliac joint 08/09/2017   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 12/31/2016   Vitamin D deficiency 10/23/2016   Bilateral hip pain 09/08/2016   History of colonic polyps    Pedal edema 02/25/2016   Advanced care planning/counseling discussion 10/28/2015   CKD (chronic kidney disease) stage 2, GFR 60-89 ml/min 07/03/2015   Dyspnea on exertion 06/05/2015   Mixed stress and urge urinary incontinence 03/16/2015   Vitamin B12 deficiency 02/28/2015   Chronic insomnia 02/25/2015   COPD exacerbation (HCC) 12/11/2014   Osteoporosis 12/09/2014   Medicare annual wellness visit, subsequent 10/15/2014   Health maintenance examination 10/15/2014   Adjustment disorder with mixed anxiety and depressed mood 10/15/2014   Chronic diastolic heart failure 09/19/2014   Chest pain 08/25/2014   Mitral valve  replaced 12/2013   Acute on chronic heart failure with preserved ejection fraction (HFpEF, >= 50%) (HCC) 10/10/2013   Pulmonary nodules 02/23/2012   History of radiation therapy    Macrocytic anemia 09/21/2011   History of rectal cancer 05/26/2011   Essential hypertension, benign 03/24/2009   Overweight (BMI 25.0-29.9) 03/05/2008   Chronic lower back pain 03/05/2008   Meralgia paresthetica 12/13/2007   GERD (gastroesophageal reflux disease) 08/06/2007   HLD (hyperlipidemia) 05/03/2007   COPD (chronic obstructive pulmonary disease) 05/03/2007    ONSET DATE: 06/10/22  REFERRING DIAG:  M54.50,G89.29 (ICD-10-CM) - Chronic midline low back pain without sciatica  R26.89 (  ICD-10-CM) - Imbalance    THERAPY DIAG:  Abnormality of gait and mobility  Other abnormalities of gait and mobility  Difficulty in walking, not elsewhere classified  Muscle weakness (generalized)  Low back pain without sciatica, unspecified back pain laterality, unspecified chronicity  Rationale for Evaluation and Treatment: Rehabilitation  SUBJECTIVE:                                                                                                                                                                                             SUBJECTIVE STATEMENT: Pt continues to struggle with energy and tolerance to standing. Back is bothering her less today, no number given to rate pain. Last PT visit within authorization. Today will be last treatment until after the new year, per patient.    Pt accompanied by: self  PERTINENT HISTORY: Patient has a long history of low back pain, arthritis, COPD, hypertension, compression fracture, hyperlipidemia, visual deficits in the left eye  From recent hospitalization:  77 y.o. female with a history of second-degree AV block, anxiety, depression, atrial fibrillation, PUD, aortic arteriosclerosis, ascending aortic aneurysm, diastolic heart failure. Patient presented secondary  to shortness of breath and found to have evidence of acute diastolic heart failure in addition to COPD exacerbation. Heart failure complicated by high burden atrial fibrillation/flutter. Cardiology consulted. Patient diuresed and given steroids/breathing treatments with improvement of symptoms. Patient underwent a Transesophageal Echocardiogram with cardioversion per cardiology recommendations prior to discharge.     From Eval: Pt reports with her balance she is falling in scenarios where she begins to feel off balance then she falls. She had a fall when putting out round up and other loss of balance when she was repotting a plant and lost her balance.  Both of her fall she noted were in flexed posture for prolonged period likely resulting in inability to perform adequate extension to get out of this posture resulting in fall.  Pt also has low back pain from " disc fragments" that are in her back. The pain worsens with longer standing. Pt low back pain for years. She cannot go into extension and cannot lay on her stomach.  PAIN:  Are you having pain? Yes: NPRS scale: 3/10 Pain location: low back across both sides.  Pain description: Dull, aching Aggravating factors: standing, walking Relieving factors: sitting ( flexed postures)  PRECAUTIONS: Fall  RED FLAGS: None   WEIGHT BEARING RESTRICTIONS: No  FALLS: Has patient fallen in last 6 months? Yes. Number of falls 2  LIVING ENVIRONMENT: Lives with: lives with their family Lives in: House/apartment Stairs: Yes: Internal: 3 steps; can reach both Has following equipment  at home: Single point cane, quad cane  PLOF: Independent with basic ADLs  PATIENT GOALS: Improve balance, decrease falls, improve her back pain   OBJECTIVE:   DIAGNOSTIC FINDINGS: From MRI of lumbar spine in June 2024 interpreted by radiologist and impression as below  IMPRESSION: 1. No acute fracture or acute finding. 2. Chronic compression fracture of L1 stable from  the prior lumbar spine CT. 3. Postoperative changes from fusions, L2-L3, L3-L4 and L4-L5, also stable from the prior C  COGNITION: Overall cognitive status: Within functional limits for tasks assessed   SENSATION: Not tested   POSTURE: rounded shoulders, decreased lumbar lordosis, increased thoracic kyphosis, and flexed trunk   LOWER EXTREMITY ROM:     MMT Right Eval Left Eval  Hip flexion 4- 4-  Hip extension 4- 4-  Hip abduction 4 4  Hip adduction 4+ 4+  Hip internal rotation    Hip external rotation    Knee flexion 4 4-  Knee extension 5 4+  Ankle dorsiflexion 4 4  Ankle plantarflexion    Ankle inversion    Ankle eversion     (Blank rows = not tested)   (Blank rows = not tested)  BED MOBILITY:  Pt does not sleep in the bed, currently sleeps in recliner  TRANSFERS: Assistive device utilized: Single point cane  Sit to stand: Modified independence Stand to sit: Modified independence Chair to chair: Modified independence Floor:  does not attempt   STAIRS: Level of Assistance: SBA Stair Negotiation Technique: Alternating Pattern  with Bilateral Rails Number of Stairs: 4  Height of Stairs: 6 in  Comments:   GAIT: Gait pattern: decreased arm swing- Right, decreased arm swing- Left, decreased step length- Right, decreased step length- Left, decreased stance time- Right, decreased stance time- Left, and trunk flexed Distance walked: 80 ft Assistive device utilized: Quad cane small base Level of assistance: Modified independence Comments: flexed posture improved with use of rollator but UE fatigued with use of rollator.   FUNCTIONAL TESTS:  See goal assessment for updated outcome measure assessment.     PATIENT SURVEYS:  FOTO 52 RAG of 53  TODAY'S TREATMENT:         107/67 85bpm  SPO2:  AA/ROM Nustep: seat 9, arms 9, level 1-3 x 5 min , takes a dyspnea break at 2.5 min rated SOB 6/10 additional therapeutic rest break     HEP adjusted and performed  as listed below  Exercises - Sit to Stand with Armchair   5 reps - Standing March with Counter Support  - 10 reps - Standing Hip Abduction with Counter Support  -- 5 reps - 1 hold - Seated Hip Abduction with Resistance  RTB - 10 reps - Seated Long Arc Quad  RTB bil - 10 reps - Seated Leg Extension with Resistance RTB -  10 reps - Seated Knee Flexion with Anchored Resistance RTB - 10 reps - trunk extension sitting x8  - low row sitting with trunk in netrual extension x 10 RTB    PATIENT EDUCATION: Education details: POC, importance of HEP compliance with limited PT visits available.  Person educated: Patient Education method: Explanation Education comprehension: verbalized understanding  HOME EXERCISE PROGRAM: Access Code: [email or web address] URL: [email or web address] Date: 09/22/2023 Prepared by: Grier Rocher  Exercises - Sit to Stand with Armchair  - 1 x daily - 4 x weekly - 2 sets - 5 reps - Standing March with Counter Support  - 1 x daily - 4 x weekly - 2 sets -  10 reps - Standing Hip Abduction with Counter Support  - 1 x daily - 4 x weekly - 2 sets - 5 reps - 1 hold - Seated Hip Abduction with Resistance  - 1 x daily - 7 x weekly - 3 sets - 10 reps - Seated Long Arc Quad  - 1 x daily - 7 x weekly - 3 sets - 10 reps - Seated Leg Extension with Resistance  - 1 x daily - 7 x weekly - 3 sets - 10 reps - Seated Knee Flexion with Anchored Resistance  - 1 x daily - 7 x weekly - 3 sets - 10 reps  GOALS: Goals reviewed with patient? Yes  SHORT TERM GOALS: Target date: 10/20/2023    Patient will be independent in home exercise program to improve strength/mobility for better functional independence with ADLs. Baseline: No HEP currently  Goal status: INITIAL   LONG TERM GOALS: Target date: 12/08/2023  1.  Patient (> 9 years old) will complete five times sit to stand test in < 15 seconds indicating an increased LE strength and improved balance. Baseline: 17 sec with UE  assist  12/6: adjusted baseline: 18.1sec  Goal status: INITIAL  2.  Patient will increase FOTO score to equal to or greater than  53   to demonstrate statistically significant improvement in mobility and quality of life.  Baseline: 47 12/6 adjusted baseline: 46  Goal status: INITIAL   3.   Patient will reduce timed up and go to <11 seconds to reduce fall risk and demonstrate improved transfer/gait ability. Baseline: 14 sec, [place] use for STS  12/6: adjusted baseline: 18.5 with UE support and SPC  Goal status:  INITIAL    4.   Patient will increase 10 meter walk test to >0.34m/s as to improve gait speed for better community ambulation and to reduce fall risk. Baseline: .675 m/s no AD  12/6: adjusted baseline: .652m/s Goal status: INITIAL Adjusted:   5.   Patient will increase six minute walk test distance to >500 without desat <92% or increased in back pain for progression to community ambulator and improve gait ability Baseline: 450 ft with clinic rollator to offset low back pain  12/6: 56ft with desat to 91%  Goal status: INITIAL Adjusted:   6. Pt will improve Berg balance Scale score by 6 points to indicate reduced fall risk and improved ability to access community.  Baseline: to be completed at next visit Goal status: new. Initial       ASSESSMENT:      CLINICAL IMPRESSION:  Fatigue limiting slightly. HEP modified for generalized strengthening. Pt has no additional PT visits available. Education on importance and continued compliance with HEP to improve function and reduce overall pain and debility.   Pt will continue to benefit from skilled physical therapy intervention to address impairments, improve QOL, and attain therapy goals.    OBJECTIVE IMPAIRMENTS: Abnormal gait, decreased activity tolerance, decreased balance, decreased endurance, decreased mobility, difficulty walking, decreased ROM, decreased strength, hypomobility, and pain.   ACTIVITY LIMITATIONS: carrying,  lifting, standing, sleeping, stairs, transfers, and locomotion level  PARTICIPATION LIMITATIONS: driving, community activity, and yard work  PERSONAL FACTORS: Age, Fitness, Time since onset of injury/illness/exacerbation, and 3+ comorbidities:  arthritis, COPD, hypertension, compression fracture, hyperlipidemia, visual deficits in the left eye  are also affecting patient's functional outcome.   REHAB POTENTIAL: Good  CLINICAL DECISION MAKING: Stable/uncomplicated  EVALUATION COMPLEXITY: Low  PLAN:  PT FREQUENCY: 2x/week  PT DURATION: 12 weeks  PLANNED INTERVENTIONS: Therapeutic exercises, Therapeutic activity, Neuromuscular re-education, Balance training, Gait training, Patient/Family education, Self Care, Joint mobilization, Joint manipulation, Stair training, Dry Needling, and Manual therapy  PLAN FOR NEXT SESSION:   Therex and HEP review for LPB and generalized strengthening.   Grier Rocher PT, DPT  Physical Therapist - [facility]  10:24 AM 09/22/23

## 2023-09-25 ENCOUNTER — Encounter: Payer: Self-pay | Admitting: Gastroenterology

## 2023-09-25 ENCOUNTER — Ambulatory Visit (INDEPENDENT_AMBULATORY_CARE_PROVIDER_SITE_OTHER): Payer: Medicare Other | Admitting: Gastroenterology

## 2023-09-25 ENCOUNTER — Ambulatory Visit: Payer: Medicare Other | Admitting: Physical Therapy

## 2023-09-25 ENCOUNTER — Other Ambulatory Visit (INDEPENDENT_AMBULATORY_CARE_PROVIDER_SITE_OTHER): Payer: Medicare Other

## 2023-09-25 VITALS — BP 130/70 | HR 79 | Ht 64.0 in | Wt 149.0 lb

## 2023-09-25 DIAGNOSIS — R195 Other fecal abnormalities: Secondary | ICD-10-CM

## 2023-09-25 DIAGNOSIS — Z85048 Personal history of other malignant neoplasm of rectum, rectosigmoid junction, and anus: Secondary | ICD-10-CM

## 2023-09-25 DIAGNOSIS — C20 Malignant neoplasm of rectum: Secondary | ICD-10-CM | POA: Diagnosis not present

## 2023-09-25 DIAGNOSIS — Z8719 Personal history of other diseases of the digestive system: Secondary | ICD-10-CM | POA: Diagnosis not present

## 2023-09-25 DIAGNOSIS — F1090 Alcohol use, unspecified, uncomplicated: Secondary | ICD-10-CM

## 2023-09-25 DIAGNOSIS — Z87891 Personal history of nicotine dependence: Secondary | ICD-10-CM

## 2023-09-25 LAB — COMPREHENSIVE METABOLIC PANEL
ALT: 26 U/L (ref 0–35)
AST: 21 U/L (ref 0–37)
Albumin: 3.2 g/dL — ABNORMAL LOW (ref 3.5–5.2)
Alkaline Phosphatase: 115 U/L (ref 39–117)
BUN: 15 mg/dL (ref 6–23)
CO2: 34 meq/L — ABNORMAL HIGH (ref 19–32)
Calcium: 9.2 mg/dL (ref 8.4–10.5)
Chloride: 102 meq/L (ref 96–112)
Creatinine, Ser: 0.94 mg/dL (ref 0.40–1.20)
GFR: 58.23 mL/min — ABNORMAL LOW (ref >60.00)
Glucose, Bld: 77 mg/dL (ref 70–99)
Potassium: 3.8 meq/L (ref 3.5–5.1)
Sodium: 143 meq/L (ref 135–145)
Total Bilirubin: 0.5 mg/dL (ref 0.2–1.2)
Total Protein: 6.6 g/dL (ref 6.0–8.3)

## 2023-09-25 LAB — CBC WITH DIFFERENTIAL/PLATELET
Basophils Absolute: 0 10*3/uL (ref 0.0–0.1)
Basophils Relative: 0.8 % (ref 0.0–3.0)
Eosinophils Absolute: 0.2 10*3/uL (ref 0.0–0.7)
Eosinophils Relative: 4 % (ref 0.0–5.0)
HCT: 35.8 % — ABNORMAL LOW (ref 36.0–46.0)
Hemoglobin: 11.6 g/dL — ABNORMAL LOW (ref 12.0–15.0)
Lymphocytes Relative: 6 % — ABNORMAL LOW (ref 12.0–46.0)
Lymphs Abs: 0.3 10*3/uL — ABNORMAL LOW (ref 0.7–4.0)
MCHC: 32.4 g/dL (ref 30.0–36.0)
MCV: 100.3 fL — ABNORMAL HIGH (ref 78.0–100.0)
Monocytes Absolute: 0.5 10*3/uL (ref 0.1–1.0)
Monocytes Relative: 11.4 % (ref 3.0–12.0)
Neutro Abs: 3.7 10*3/uL (ref 1.4–7.7)
Neutrophils Relative %: 77.8 % — ABNORMAL HIGH (ref 43.0–77.0)
Platelets: 121 10*3/uL — ABNORMAL LOW (ref 150.0–400.0)
RBC: 3.57 Mil/uL — ABNORMAL LOW (ref 3.87–5.11)
RDW: 17 % — ABNORMAL HIGH (ref 11.5–15.5)
WBC: 4.7 10*3/uL (ref 4.0–10.5)

## 2023-09-25 MED ORDER — OMEPRAZOLE 20 MG PO CPDR: 20.0000 mg | DELAYED_RELEASE_CAPSULE | Freq: Every day | ORAL | 3 refills | Status: DC

## 2023-09-25 NOTE — Progress Notes (Signed)
Chief Complaint: Heme positive stools  Referring Provider:  [name], MD      ASSESSMENT AND PLAN;   #1. H+ stools - Hb 11.7, MCV 99, plts 98K (11/27)-being followed by Dr. Truett Perna (neg BM Bx)  #2. H/O Rectal cancer, midrectum T3N0M0, s/p neoadj chemoXRT, lap LAR with diverting loop ileostomy 08/2011, ileostomy takedown 03/2012.  Colonoscopy 03/2013 found 1 small TA, LAR anastomosis was a bit narrowed, edematous and dilated to 2cm with balloon.  Colonoscopy Aug 2022-mild colocolic stenosis.   #3. H/O Mushy stools- on colace/miralax  #4. Valvular heart disease/A fib on eliquis  Plan: -CT AP with contrast -CBC, CMP, CEA -Omeprazole 20mg  po every day #90 -Ideally, EGD/colon off eliquis. But risks are much higher. She wants to hold off unless any active bleeding or if she requires any transfusion. -Decrease Colace every other day.  If still with mushy stools, stop MiraLAX. -FU 4 months.    HPI:    [name] is a 77 y.o. female  With multiple medical problems as listed below including A Fib/flutter, valvular heart disease on Eliquis, HFpEF, chronic pancytopenia with neg BM Bx, history of AV block s/p pacemaker placement, anxiety/depression, ascending aortic aneurysm, COPD, history of rectal cancer  Here for heme + stools, Hb 11.7, MCV 99, plts 98K (11/27)-being followed by Dr. Truett Perna (neg BM Bx)  C/O concerns about her blood count. She has been experiencing issues with low blood levels for approximately six months. Despite a bone marrow biopsy showing no signs of cancer, the cause of the low blood levels remains undetermined. The patient has been on iron supplements for two weeks to address this issue.  In Nov 2024, the patient experienced severe breathing difficulties, leading to an ER visit and subsequent hospital admission. She was diagnosed with atrial fibrillation, which progressed to flutter, and heart failure. The patient required cardioversion to restore  normal heart rhythm.  The patient also reports ongoing issues with leg wounds that spontaneously open and bleed, requiring regular visits to a wound clinic. She has been attending physical therapy for balance issues but has exhausted her sessions for the current year.  The patient is on Eliquis for blood thinning, which has resulted in easy bruising. She also reports a decrease in her COPD and pulmonary function, with her pulmonologist considering a bronchoscopy.  The patient has been experiencing inconsistent bowel movements, with some days being normal and others resulting in mushy stools. She suspects this may be related to fecal incontinence. She has a history of rectal cancer and has noticed some narrowing where she was reconnected post-surgery.  The patient also reports occasional nausea, which she attributes to taking medication on an empty stomach. She has been taking Colace, a stool softener, which may be contributing to the consistency of her stools. She also reports chronic fatigue and a lack of energy.         Past GI workup:  Colonoscopy 05/24/2021 - Diverticulosis in the left colon. - Colo- colonic LAR anastomosis was edematous and a bit narrowed but otherwise normal appearing. - No polyps or cancers.  Colonoscopy 05/2016 - Patent end- to- end colo- colonic anastomosis. - The examination was otherwise normal on direct and retroflexion views. - No polyps or cancers    On review of previous records: -Macrocytic anemia s/p bone marrow biopsy 08/23/2023: neg. decreased iron stores.  Normal cytogenetics. -Admission with heart failure, atrial fibrillation/flutter, and COPD exacerbation 08/27/2023: Cardioversion performed for 19 2024     Past  Medical History:  Diagnosis Date   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 12/31/2016   Acute cystitis without hematuria 12/22/2015   Acute right-sided low back pain without sciatica 11/26/2019   Saw [facility]ner 12/2019 - planned L2/3 translaminar  epidural steroid injection. Known DISH with thoracic spine fusion and moderate kyphosis, h/o L spine fusion L2-5   Adjustment disorder with depressed mood 11-01-14   68 yo son died MI 10/18/14 Husband died car accident 2015-04-19   Anemia    Anticoagulant long-term use 06/14/2019   She takes warfarin for afib/flutter with CHADS2VASc score of 4 (age, sex, CHF, HTN), s/p 2 bioprosthetic valve replacements Coumadin changed to eliquis October 18, 2021 per cardiology   Aortic atherosclerosis 03/03/2022   Noted on 01/2022 chest CT   Arthritis    Arthropathy of spinal facet joint 03/08/2018   Ascending aortic aneurysm 03/02/2021   4.1cm on CT 01/2021 rec yearly monitoring   Bilateral hip pain 09/08/2016   Cancer (HCC)    conon cancer 19-Oct-2011   CAP (community acquired pneumonia) 02/08/2016   Carotid stenosis 12/08/2020   Carotid US - 40-59% BICA stenosis (2015) Carotid US 12/2020 - 1-39% BICA stenosis, incidental 3.4cm structure behind L common carotid artery rec further imaging    Cataracts, bilateral    immature   Chest pain 08/25/2014   myoview low risk 02/2018 ([place])   Chronic cholecystitis with calculus 01/21/2015   S/p cholecystectomy    Chronic diastolic heart failure 09/19/2014   Chronic insomnia    Chronic lower back pain 03/05/2008   Returned to Dr Danielle Dess - translaminar ESI at L3/4. Myelogram showed significant left sided foraminal stenosis at L2/3 and L3/4 in setting of solid arthrodesis, planned DEXA and if stable osteopenia, considering anterolateral decompression with spacer.    CKD (chronic kidney disease) stage 2, GFR 60-89 ml/min 07/03/2015   Constipation    takes Miralax daily as needed   COPD (chronic obstructive pulmonary disease)    Albuterol inhaler daily as needed;Duoneb daily as needed;Spiriva daily   COVID-19 virus infection 11/11/2022   DDD (degenerative disc disease)    cervical - kyphosis with mod DD changes C5/6 and C6/7; lumbar - early DD at L2/3 (Elsner)   Depression    DISH  (diffuse idiopathic skeletal hyperostosis) 05/14/2019   By MRI noted fusion of lower thoracic vertebrae - likely contributing to unsteadiness (Elsner)   Dyspnea on exertion 06/05/2015   myoview low risk 02/2018   Edema of both lower legs due to peripheral venous insufficiency    Essential hypertension, benign 03/24/2009   hx of not on nmeds currenlty and has been a while since on meds per pt   Heart murmur    Hematuria 06/17/2021   History of kidney stones    History of radiation therapy 05/30/11 to 07/07/11   rectum   History of rectal cancer 05/26/2011   Rectal cancer 10-19-11, midrectum ypT3ypN0, s/p neoadj chemo&XRT, lap LAR with diverting loop ileostomy 08/2011, ileostomy takedown 2012/04/18 Discharged from oncology clinic 2016-10-18 Truett Perna)   History of shingles    History of vertebral fracture 11/26/2019   Old L1 vertebral compression fracture incidentally noted on imaging 2019/10/19    Hyperlipemia    takes Lovastatin daily   Lacunar infarction    Left caudate   Left hip pain 07/29/2020   Legally blind in left eye    Lymphangioma 01/07/2021   Noted incidentally on carotid US 12/2020 CT neck - Likely benign lymphangioma 01/2021   Macrocytosis 03/27/2022   Folate and b12 checked  2023   Malaise and fatigue 01/22/2018   Meralgia paresthetica 12/13/2007   Mixed stress and urge urinary incontinence 03/16/2015   Failed oxybutynin IR/ER. Vesicare initially helped but then no longer helpful.  myrbetriq was not effective.  Saw urology (MacDiarmid) - UDS largely overactive bladder with mild-mod stress incontinence. Improved on abx course - maintained on [place].    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   Pulmonary infiltrates 04/23/2019   06/13/2018-CT super D chest without contrast- generally stable chronic lung disease with pleural parenchymal scarring and scattered nodularity most of these nodules have been tree-in-bud in appearance likely postinfectious or inflammatory the dominant right lower lobe nodule seen on most recent study has resolved no new or enlarging nodules  04/22/2019-CT chest without contrast- waxing and waning per   Pulmonary nodule 02/23/2012   RLL nodule-39mm stable 2006, April 2009, and June 2009 Small pulm nodules Jan 2012 - > Oct 2012 without change   Rheumatic heart disease mitral stenosis    mod MS by echo 02/2014   Right leg swelling 06/11/2020   R venous US 06/2020 WNL: - No evidence of deep vein thrombosis in the lower extremity. No indirect evidence of obstruction proximal to the inguinal ligament. - No cystic structure found in the popliteal fossa.   S/P aortic and mitral valve bioprostheses 12/08/2013   AVR 21 mm, MVR 25 mm-MagnaEase pericardia   S/P AVR (aortic valve replacement) 2015   bioprosthetic (Bartle)   S/P MVR (mitral valve replacement) 2015   bioprosthetic (Bartle)   Sepsis secondary to UTI (HCC) 08/22/2017   Severe aortic valve stenosis 10/01/2013   mild-mod by echo 02/2014   Skin lesion 01/03/2022   Small bowel  obstruction, partial 08/2013   reolved without NGT placement.    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   Dr Amy [place]   Syncope 05/28/2018   Thrombocytopenia 11/04/2019   Typical atrial flutter 05/15/2019   Vitamin B12 deficiency 02/28/2015   Start B12 shots 02/2015    Vitamin D deficiency 10/23/2016   Wounds, multiple    bilateral legs    Past Surgical History:  Procedure Laterality Date   ANTERIOR LAT LUMBAR FUSION Left 07/17/2018   Procedure: Lumbar Two-Three Lumbar Three-Four Anterolateral decompression/interbody fusion with lateral plate fixation/Infuse;  Surgeon: Barnett Abu, MD;  Location: [facility] OR;  Service: Neurosurgery;  Laterality: Left;  Lumbar Two-Three Lumbar Three-Four Anterolateral decompression/interbody fusion with lateral plate fixation/Infuse   AORTIC VALVE REPLACEMENT N/A 12/12/2013   Procedure: AORTIC VALVE REPLACEMENT (AVR);  Surgeon: Alleen Borne, MD; Service: Open Heart Surgery  BACK SURGERY  2006, 2007   2006 SPACER, 2007 decompression and fusion L4/5   BOWEL RESECTION  04/02/2012   Procedure: SMALL BOWEL RESECTION;  Surgeon: [name], MD;  Location: [facility];  Service: General;  Laterality: N/A;   BRONCHIAL WASHINGS  03/24/2021   Procedure: BRONCHIAL WASHINGS;  Surgeon: [name], MD;  Location: [facility];  Service: Endoscopy;;   CARDIOVERSION N/A 08/29/2023   Procedure: CARDIOVERSION (CATH LAB);  Surgeon: Lewayne Bunting, MD;  Location: [facility] INVASIVE CV LAB;  Service: Cardiovascular;  Laterality: N/A;   CARPAL TUNNEL RELEASE Bilateral    CATARACT EXTRACTION W/ INTRAOCULAR LENS IMPLANT Left 10/18/2016   CATARACT EXTRACTION W/ INTRAOCULAR LENS IMPLANT Right 12/13/2016   Dr. Dagoberto Ligas   CHOLECYSTECTOMY N/A 01/21/2015   chronic cholecystitis, [name], MD   COLON RESECTION  08/25/2011   Procedure: COLON RESECTION LAPAROSCOPIC;  Surgeon: [name], MD;  Location: [facility];  Service: General;  Laterality: N/A;   Laparoscopic Assisted Low Anterior Resection Diverting Ostomy and onQ pain pump   COLONOSCOPY  03/2013   1 polyp, rpt 3 yrs Christella Hartigan)   COLONOSCOPY  05/2021   diverticulosis and patent colon anastomosis with some edema/narrowing, rpt 5 yrs Christella Hartigan)   COLONOSCOPY WITH PROPOFOL N/A 06/09/2016   patent colo-colonic anastomosis, rpt 5 yrs Christella Hartigan)   CYSTOSCOPY/URETEROSCOPY/HOLMIUM LASER/STENT PLACEMENT Right 07/03/2023   Procedure: CYSTOSCOPY RIGHT RETROGRADE PYEPLGRAM RIGHT URETEROSCOPY/HOLMIUM LASER/STENT PLACEMENT;  Surgeon: Crista Elliot, MD;  Location: [facility];  Service: Urology;  Laterality: Right;  1 HR FOR CASE   CYSTOSCOPY/URETEROSCOPY/HOLMIUM LASER/STENT PLACEMENT Right 07/17/2023   Procedure: CYSTOSCOPY/RIGHT URETEROSCOPY/STENT EXCHANGE;  Surgeon: Crista Elliot, MD;  Location: [facility];  Service: Urology;  Laterality: Right;  60 MINS FOR CASE   EP IMPLANTABLE DEVICE N/A 06/16/2016   Procedure: Pacemaker Implant;  Surgeon: Will Jorja Loa, MD;  Location: [facility] INVASIVE CV LAB;  Service: Cardiovascular;  Laterality: N/A;   FOOT SURGERY  left foot   hammer toe   ILEOSTOMY  08/25/2011   ILEOSTOMY CLOSURE  04/02/2012   Procedure: ILEOSTOMY TAKEDOWN;  Surgeon: [name], MD;  Location: [facility];  Service: General;  Laterality: N/A;   INTRAOPERATIVE TRANSESOPHAGEAL ECHOCARDIOGRAM N/A 12/12/2013   Procedure: INTRAOPERATIVE TRANSESOPHAGEAL ECHOCARDIOGRAM;  Surgeon: Alleen Borne, MD;  Location: [facility];  Service: Open Heart Surgery;  Laterality: N/A;   LEFT AND RIGHT HEART CATHETERIZATION WITH CORONARY ANGIOGRAM N/A 11/27/2013   Procedure: LEFT AND RIGHT HEART CATHETERIZATION WITH CORONARY ANGIOGRAM;  Surgeon: Thurmon Fair, MD;  Location: [facility] CATH LAB;  Service: Cardiovascular;  Laterality: N/A;   MITRAL VALVE REPLACEMENT N/A 12/12/2013   Procedure: MITRAL VALVE (MV) REPLACEMENT OR REPAIR;  Surgeon: Alleen Borne, MD; Service: Open Heart Surgery   TEE WITHOUT CARDIOVERSION N/A 11/29/2013    Procedure: TRANSESOPHAGEAL ECHOCARDIOGRAM (TEE);  Surgeon: Quintella Reichert, MD;  Location: [facility] ENDOSCOPY;  Service: Cardiovascular;  Laterality: N/A;   TEE WITHOUT CARDIOVERSION N/A 06/04/2019   Procedure: TRANSESOPHAGEAL ECHOCARDIOGRAM (TEE);  Surgeon: Thurmon Fair, MD;  Location: [facility] ENDOSCOPY;  Service: Cardiovascular;  Laterality: N/A;   TONSILLECTOMY     TRANSESOPHAGEAL ECHOCARDIOGRAM (CATH LAB) N/A 08/29/2023   Procedure: TRANSESOPHAGEAL ECHOCARDIOGRAM;  Surgeon: Lewayne Bunting, MD;  Location: [facility] INVASIVE CV LAB;  Service: Cardiovascular;  Laterality: N/A;   VIDEO BRONCHOSCOPY N/A 03/24/2021   Procedure: VIDEO BRONCHOSCOPY WITHOUT FLUORO;  Surgeon: [name], MD;  Location: [facility];  Service: Endoscopy;  Laterality: N/A;  SMALL SCOPE,PREMEDICATE WITH NEBULIZED LIDOCAINE    Family History  Problem Relation Age of Onset   Heart disease Mother    Diabetes Mother    Hypertension Mother    Stroke Mother    Lung cancer Father    Breast cancer Maternal Aunt 31   Heart attack Maternal Grandfather    CAD Son 46   Heart attack Son 30   Rectal cancer Neg Hx    Stomach cancer Neg Hx    Esophageal cancer Neg Hx    Colon cancer Neg Hx     Social History   Tobacco Use   Smoking status: Former    Current packs/day: 0.00    Average packs/day: 1.5 packs/day for 10.0 years (15.0 ttl pk-yrs)    Types: Cigarettes    Start date: 12/27/1964    Quit date: 12/28/1974    Years since quitting: 48.7   Smokeless tobacco: Never   Tobacco comments:    quit smoking in 1976  Vaping Use   Vaping status: Never Used  Substance Use Topics   Alcohol use: Never    Alcohol/week: 2.0 standard drinks of alcohol    Types: 2 Glasses of wine per week   Drug use: No    Current Outpatient Medications  Medication Sig Dispense Refill   acetaminophen (TYLENOL) 500 MG tablet Take 2 tablets (1,000 mg total) by mouth daily. (Patient taking differently: Take 1,000 mg by mouth in the morning.)      acidophilus (RISAQUAD) CAPS capsule Take 1 capsule by mouth daily.     albuterol (VENTOLIN HFA) 108 (90 Base) MCG/ACT inhaler Inhale 2 puffs into the lungs every 6 (six) hours as needed for wheezing. 18 g 6   ascorbic acid (VITAMIN C) 500 MG tablet Take 1 tablet (500 mg total) by mouth daily.     Biotin 1000 MCG tablet Take 1,000 mcg by mouth daily.     buPROPion (WELLBUTRIN SR) 150 MG 12 hr tablet TAKE 1 TABLET(150 MG) BY MOUTH EVERY MORNING (Patient taking differently: Take 150 mg by mouth in the morning.) 90 tablet 1   calcium carbonate (OSCAL) 1500 (600 Ca) MG TABS tablet Take 600 mg of elemental calcium by mouth daily.     carboxymethylcellulose (REFRESH PLUS) 0.5 % SOLN Place 1 drop into both eyes 3 (three) times daily as needed (dry eyes).     cephALEXin (KEFLEX) 250 MG capsule Take 250 mg by mouth daily with breakfast.     cholecalciferol (VITAMIN D3) 25 MCG (1000 UNIT) tablet Take 1,000 Units by mouth daily.     docusate sodium (COLACE) 100 MG capsule Take 200 mg by mouth daily.     ELIQUIS 5 MG TABS tablet TAKE 1 TABLET(5 MG) BY MOUTH TWICE DAILY (Patient taking differently: Take 5 mg by mouth in the morning and at bedtime.) 180 tablet 1   ferrous sulfate 325 (65 FE) MG EC tablet Take 1 tablet (325 mg total) by mouth daily with breakfast.     Fluticasone-Umeclidin-Vilant (TRELEGY ELLIPTA) 200-62.5-25 MCG/ACT AEPB Inhale 1 puff into the lungs daily. 60 each 11   furosemide (LASIX) 40 MG tablet TAKE 1 TABLET BY MOUTH EVERY DAY. MAY TAKE AN EXTRA TABLET AS NEEDED FOR SWELLING (Patient taking differently: Take 40 mg by mouth in the morning.) 135 tablet 2   gabapentin (NEURONTIN) 100 MG capsule Take 1 capsule (100 mg total) by mouth 2 (two) times daily as needed (back pain - for daytime use). (Patient taking differently: Take 100 mg by mouth daily.) 60 capsule 1   gabapentin (NEURONTIN) 300 MG  capsule Take 300 mg by mouth at bedtime.     hydrocortisone (ANUSOL-HC) 2.5 % rectal cream Place 1  Application rectally 2 (two) times daily. (Patient taking differently: Place 1 Application rectally 2 (two) times daily as needed for hemorrhoids (or pain).) 30 g 0   ipratropium-albuterol (DUONEB) 0.5-2.5 (3) MG/3ML SOLN Take 3 mLs by nebulization every 6 (six) hours as needed. 360 mL 0   lisinopril (ZESTRIL) 2.5 MG tablet Take 1 tablet (2.5 mg total) by mouth daily. 30 tablet 2   lovastatin (MEVACOR) 40 MG tablet TAKE 1 TABLET(40 MG) BY MOUTH AT BEDTIME (Patient taking differently: Take 40 mg by mouth daily.) 90 tablet 0   metoprolol succinate (TOPROL XL) 25 MG 24 hr tablet Take 1 tablet (25 mg total) by mouth every evening. 90 tablet 3   montelukast (SINGULAIR) 10 MG tablet TAKE 1 TABLET(10 MG) BY MOUTH AT BEDTIME 90 tablet 1   Multiple Vitamins-Minerals (PRESERVISION AREDS 2 PO) Take 2 capsules by mouth in the morning.     ondansetron (ZOFRAN-ODT) 4 MG disintegrating tablet Take 1 tablet (4 mg total) by mouth every 8 (eight) hours as needed for nausea or vomiting. 20 tablet 1   Phenazopyrid-Cranbry-C-Probiot (AZO URINARY TRACT SUPPORT PO) Take 1 capsule by mouth daily.     polyethylene glycol (MIRALAX / GLYCOLAX) packet Take 17 g by mouth daily. (Patient taking differently: Take 17 g by mouth daily as needed for mild constipation.) 14 each 0   potassium chloride (KLOR-CON) 10 MEQ tablet TAKE 2 TABLETS(20 MEQ) BY MOUTH DAILY (Patient taking differently: Take 20 mEq by mouth in the morning.) 180 tablet 1   psyllium (REGULOID) 0.52 g capsule Take 0.52 g by mouth in the morning and at bedtime.     traMADol (ULTRAM) 50 MG tablet Take 1 tablet (50 mg total) by mouth 2 (two) times daily as needed for moderate pain (pain score 4-6). 60 tablet 0   traZODone (DESYREL) 50 MG tablet TAKE 1/2 TO 1 TABLET(25 TO 50 MG) BY MOUTH AT BEDTIME (Patient taking differently: Take 50 mg by mouth at bedtime.) 30 tablet 6   Vibegron (GEMTESA) 75 MG TABS Take 75 mg by mouth daily.     vitamin B-12 (CYANOCOBALAMIN) 500 MCG  tablet Take 1 tablet (500 mcg total) by mouth 2 (two) times a week.     Zinc 50 MG CAPS Take 1 capsule (50 mg total) by mouth daily.  0   alendronate (FOSAMAX) 70 MG tablet Take 1 tablet (70 mg total) by mouth every 7 (seven) days. Take with a full glass of water on an empty stomach. (Patient not taking: Reported on 09/25/2023) 4 tablet 11   No current facility-administered medications for this visit.    Allergies  Allergen Reactions   Tape Other (See Comments)    "THICK TAPES PULLS OFF MY SKIN"   Codeine Nausea Only   Prednisone Nausea Only   Sulfa Antibiotics Nausea Only    Review of Systems:  Constitutional: Denies fever, chills, diaphoresis, appetite change and fatigue.  HEENT: neg  Respiratory: Denies SOB, DOE, cough, chest tightness,  and wheezing.   Cardiovascular: Denies chest pain, palpitations and leg swelling.  Genitourinary: Denies dysuria, urgency, frequency, hematuria, flank pain and difficulty urinating.  Musculoskeletal: Denies myalgias, back pain, joint swelling, arthralgias and gait problem.  Skin: No rash.  Neurological: Denies dizziness, seizures, syncope, weakness, light-headedness, numbness and headaches.  Hematological: Denies adenopathy. Easy bruising, personal or family bleeding history  Psychiatric/Behavioral: No anxiety or depression  Physical Exam:    BP 130/70   Pulse 79   Ht 5\' 4"  (1.626 m)   Wt 149 lb (67.6 kg)   BMI 25.58 kg/m  Wt Readings from Last 3 Encounters:  09/25/23 149 lb (67.6 kg)  09/11/23 146 lb (66.2 kg)  09/06/23 144 lb 9.6 oz (65.6 kg)   Constitutional:  Well-developed, in no acute distress. Psychiatric: Normal mood and affect. Behavior is normal. HEENT: Pupils normal.  Conjunctivae are normal. No scleral icterus. Cardiovascular: Normal rate, regular rhythm. No edema Pulmonary/chest: Effort normal and breath sounds normal. No wheezing, rales or rhonchi. Abdominal: Soft, nondistended. Nontender. Bowel sounds active  throughout. There are no masses palpable. No hepatomegaly.  Well-healed surgical scars Rectal: Deferred Neurological: Alert and oriented to person place and time. Skin: Skin is warm and dry. No rashes noted.  Data Reviewed: I have personally reviewed following labs and imaging studies  CBC:    Latest Ref Rng & Units 09/06/2023    9:34 AM 08/30/2023    4:29 AM 08/29/2023    4:02 AM  CBC  WBC 4.0 - 10.5 K/uL 7.6  5.8  5.6   Hemoglobin 12.0 - 15.0 g/dL 16.1  09.6  04.5   Hematocrit 36.0 - 46.0 % 37.6  36.0  33.3   Platelets 150 - 400 K/uL 98  90  90     CMP:    Latest Ref Rng & Units 08/30/2023    4:29 AM 08/29/2023    4:02 AM 08/28/2023    4:05 AM  CMP  Glucose 70 - 99 mg/dL 409  811  914   BUN 8 - 23 mg/dL 26  25  20    Creatinine 0.44 - 1.00 mg/dL 7.82  9.56  2.13   Sodium 135 - 145 mmol/L 132  136  136   Potassium 3.5 - 5.1 mmol/L 4.4  4.1  4.3   Chloride 98 - 111 mmol/L 97  97  99   CO2 22 - 32 mmol/L 28  30  31    Calcium 8.9 - 10.3 mg/dL 8.9  9.2  9.1   Total Protein 6.5 - 8.1 g/dL   6.4   Total Bilirubin <1.2 mg/dL   0.6   Alkaline Phos 38 - 126 U/L   62   AST 15 - 41 U/L   16   ALT 0 - 44 U/L   10        Radiology Studies: DG Chest 2 View Result Date: 09/17/2023 CLINICAL DATA:  Pleural effusion EXAM: CHEST - 2 VIEW COMPARISON:  08/27/2023 FINDINGS: Unchanged cardiac silhouette and mediastinal contours post median sternotomy, valve replacement. Stable positioning of support apparatus. No change to slight reduction in trace layering bilateral effusions and associated bibasilar opacities. Redemonstrated right apical pleural-parenchymal thickening. There is mild diffuse slightly nodular thickening of the pulmonary interstitium, right-greater-than-left. Small amount of fluid is seen tracking within the left fissure. No new focal airspace opacities. No pneumothorax. No acute osseous abnormalities. IMPRESSION: 1. Stable examination without superimposed acute  cardiopulmonary disease. 2. No change to slight reduction in trace layering bilateral effusions and associated bibasilar opacities, likely atelectasis 3. Pulmonary venous congestion without frank evidence of edema. 4. Unchanged right apical pleural-parenchymal thickening. Electronically Signed   By: Simonne Come [name]   On: 09/17/2023 18:36   ECHO TEE Result Date: 08/29/2023    TRANSESOPHOGEAL ECHO REPORT   Patient Name:   [name] Date of Exam: 08/29/2023 Medical Rec #:  [id number]  Height:       64.0 in Accession #:    [id number]           Weight:       153.9 lb Date of Birth:  1945/12/17            BSA:          1.750 m Patient Age:    77 years             BP:           138/70 mmHg Patient Gender: F                    HR:           59 bpm. Exam Location:  Inpatient Procedure: Transesophageal Echo, 3D Echo, Color Doppler and Cardiac Doppler Indications:     I48.91* Unspecified atrial fibrillation  History:         Patient has prior history of Echocardiogram examinations, most                  recent 08/28/2023. Pacemaker. 12/12/13 AVR with 21mm Magna Ease                  bioprosthetic and MVR with 25mm Mitral Magna bioprosthetic.                  Aortic Valve: 21 mm Magna bioprosthetic valve is present in the                  aortic position. Procedure Date: 12/08/13.                  Mitral Valve: 25 mm Edwards MagnaEase bioprosthetic valve valve                  is present in the mitral position. Procedure Date: 12/08/13.  Sonographer:     Irving Burton Senior RDCS Referring Phys:  [id number] BRIAN S CRENSHAW Diagnosing Phys: Olga Millers MD PROCEDURE: After discussion of the risks and benefits of a TEE, an informed consent was obtained from the patient. The transesophogeal probe was passed without difficulty through the esophogus of the patient. Sedation performed by different physician. The patient was monitored while under deep sedation. Anesthestetic sedation was provided intravenously by  Anesthesiology: 120mg  of Propofol, 60mg  of Lidocaine. The patient developed no complications during the procedure. A successful direct current cardioversion was performed at 200 joules with 1 attempt.  IMPRESSIONS  1. Left ventricular ejection fraction, by estimation, is 60 to 65%. The left ventricle has normal function. The left ventricle has no regional wall motion abnormalities. There is severe left ventricular hypertrophy.  2. Right ventricular systolic function is normal. The right ventricular size is mildly enlarged.  3. Left atrial size was moderately dilated. No left atrial/left atrial appendage thrombus was detected.  4. Right atrial size was moderately dilated.  5. The mitral valve has been repaired/replaced. Trivial mitral valve regurgitation. Mild to moderate mitral stenosis. There is a 25 mm Edwards MagnaEase bioprosthetic valve present in the mitral position. Procedure Date: 12/08/13.  6. Tricuspid valve regurgitation is moderate.  7. The aortic valve has been repaired/replaced. Aortic valve regurgitation is not visualized. There is a 21 mm Magna bioprosthetic valve present in the aortic position. Procedure Date: 12/08/13.  8. Aortic dilatation noted. There is mild dilatation of the ascending aorta, measuring 41 mm. 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  9. Moderately dilated pulmonary  artery. 10. Evidence of atrial level shunting detected by color flow Doppler. There is a small patent foramen ovale. FINDINGS  Left Ventricle: Left ventricular ejection fraction, by estimation, is 60 to 65%. The left ventricle has normal function. The left ventricle has no regional wall motion abnormalities. The left ventricular internal cavity size was small. There is severe left ventricular hypertrophy. Right Ventricle: The right ventricular size is mildly enlarged. Right ventricular systolic function is normal. Left Atrium: Left atrial size was moderately dilated. No left atrial/left atrial  appendage thrombus was detected. Right Atrium: Right atrial size was moderately dilated. Pericardium: There is no evidence of pericardial effusion. Mitral Valve: The mitral valve has been repaired/replaced. Trivial mitral valve regurgitation. There is a 25 mm Edwards MagnaEase bioprosthetic valve present in the mitral position. Procedure Date: 12/08/13. Mild to moderate mitral valve stenosis. MV peak gradient, 14.3 mmHg. The mean mitral valve gradient is 7.2 mmHg. Tricuspid Valve: The tricuspid valve is normal in structure. Tricuspid valve regurgitation is moderate. Aortic Valve: The aortic valve has been repaired/replaced. Aortic valve regurgitation is not visualized. Aortic valve mean gradient measures 12.0 mmHg. Aortic valve peak gradient measures 19.0 mmHg. There is a 21 mm Magna bioprosthetic valve present in the aortic position. Procedure Date: 12/08/13. Pulmonic Valve: The pulmonic valve was normal in structure. Pulmonic valve regurgitation is not visualized. Aorta: Aortic dilatation noted. There is mild dilatation of the ascending aorta, measuring 41 mm. There is moderate (Grade III) plaque involving the descending aorta. Pulmonary Artery: The pulmonary artery is moderately dilated. IAS/Shunts: Evidence of atrial level shunting detected by color flow Doppler. A small patent foramen ovale is detected. Additional Comments: Spectral Doppler performed. AORTIC VALVE AV Vmax:           218.00 cm/s AV Vmean:          169.000 cm/s AV VTI:            0.442 m AV Peak Grad:      19.0 mmHg AV Mean Grad:      12.0 mmHg LVOT Vmax:         65.40 cm/s LVOT Vmean:        49.100 cm/s LVOT VTI:          0.116 m LVOT/AV VTI ratio: 0.26 MITRAL VALVE             TRICUSPID VALVE MV Peak grad: 14.3 mmHg  TR Peak grad:   43.6 mmHg MV Mean grad: 7.2 mmHg   TR Vmax:        330.00 cm/s MV Vmax:      1.89 m/s MV Vmean:     124.0 cm/s SHUNTS                          Systemic VTI: 0.12 m Olga Millers MD Electronically signed by Olga Millers MD Signature Date/Time: 08/29/2023/12:16:56 PM    Final    EP STUDY Result Date: 08/29/2023 See surgical note for result.  ECHOCARDIOGRAM COMPLETE Result Date: 08/28/2023    ECHOCARDIOGRAM REPORT   Patient Name:   [name] Date of Exam: 08/28/2023 Medical Rec #:  [id number]            Height:       64.0 in Accession #:    [id number]           Weight:       149.0 lb Date of Birth:  10-07-1946  BSA:          1.726 m Patient Age:    77 years             BP:           152/83 mmHg Patient Gender: F                    HR:           63 bpm. Exam Location:  Inpatient Procedure: 2D Echo, Cardiac Doppler and Color Doppler Indications:    CHF-Acute Diastolic I50.31  History:        Patient has prior history of Echocardiogram examinations, most                 recent 10/06/2022. COPD, Mitral Valve Disease and Aortic Valve                 Disease, Arrythmias:Atrial Flutter; Signs/Symptoms:Murmur.  Sonographer:    Darlys Gales Referring Phys: [id number] DAVID MANUEL ORTIZ IMPRESSIONS  1. Left ventricular ejection fraction, by estimation, is >75%. The left ventricle has hyperdynamic function. The left ventricle has no regional wall motion abnormalities. There is moderate left ventricular hypertrophy. Left ventricular diastolic function could not be evaluated.  2. Right ventricular systolic function reduced. The right ventricular size is mildly enlarged. There is severely elevated pulmonary artery systolic pressure. The estimated right ventricular systolic pressure is 64.0 mmHg.  3. Left atrial size was mildly dilated.  4. Right atrial size was mildly dilated.  5. 25 mm [facility] Ease bioprosthetic valve in the mitral position (12/08/13), well-seated, no regurgitation, findings to suggest mitral valve stenosis (peak velocity 2.82m/s, MG , TVI ratio 3.5, EOA0.80cm2, HR 75bpm) but higher gradients can also  be seen with increased flow through the mitral valve, increased HR, prosthesis-patient  mismatch. Subvalvular calcification present. Clinical correlation required.  6. Tricuspid valve regurgitation is moderate.  7. 21 mm magna bioprosthetic valve in the aortic position (12/08/13), well-seated, no valvular stenosis or regurgitation (peak velocity 2.78m/s, MG , AVA 1.4cm2, AT<100 msec, DI 0.51).  8. Aortic dilatation noted. There is dilatation of the ascending aorta, measuring 40 mm.  9. The inferior vena cava is dilated in size with <50% respiratory variability, suggesting right atrial pressure of 15 mmHg. Comparison(s): A prior study was performed on 10/06/2022. Prior transmitral valve gradient (mean 10 mmHg) and transaortic gradient (mean ). FINDINGS  Left Ventricle: Left ventricular ejection fraction, by estimation, is >75%. The left ventricle has hyperdynamic function. The left ventricle has no regional wall motion abnormalities. The left ventricular internal cavity size was normal in size. There is moderate left ventricular hypertrophy. Left ventricular diastolic function could not be evaluated due to mitral valve replacement. Left ventricular diastolic function could not be evaluated. Right Ventricle: The right ventricular size is mildly enlarged. Right vetricular wall thickness was not well visualized. Right ventricular systolic function reduced. There is severely elevated pulmonary artery systolic pressure. The tricuspid regurgitant  velocity is 3.50 m/s, and with an assumed right atrial pressure of 15 mmHg, the estimated right ventricular systolic pressure is 64.0 mmHg. Left Atrium: Left atrial size was mildly dilated. Right Atrium: Right atrial size was mildly dilated. Pericardium: There is no evidence of pericardial effusion. Mitral Valve: 25 mm [facility] Ease bioprosthetic valve in the mitral position (12/08/13), well-seated, no regurgitation, findings to suggest mitral valve stenosis (peak velocity 2.82m/s, MG , TVI ratio 3.5, EOA0.80cm2, HR 75bpm) but higher gradients can  also be seen with  increased flow through the mitral valve, increased HR, prosthesis-patient mismatch. Subvalvular calcification present. Clinical correlation required. MV peak gradient, 25.6 mmHg. The mean mitral valve gradient is 11.0 mmHg. Tricuspid Valve: The tricuspid valve is normal in structure. Tricuspid valve regurgitation is moderate . No evidence of tricuspid stenosis. Aortic Valve: 21 mm magna bioprosthetic valve in the aortic position (12/08/13), well-seated, no valvular stenosis or regurgitation (peak velocity 2.54m/s, MG , AVA 1.4cm2, AT<100 msec, DI 0.51). Aortic valve mean gradient measures 13.0 mmHg. Aortic valve peak gradient measures 20.8 mmHg. Aortic valve area, by VTI measures 1.44 cm. Pulmonic Valve: The pulmonic valve was not well visualized. Pulmonic valve regurgitation is mild. No evidence of pulmonic stenosis. Aorta: Aortic dilatation noted. There is dilatation of the ascending aorta, measuring 40 mm. Venous: The inferior vena cava is dilated in size with less than 50% respiratory variability, suggesting right atrial pressure of 15 mmHg. IAS/Shunts: The interatrial septum was not well visualized. Additional Comments: A device lead is visualized.  LEFT VENTRICLE PLAX 2D LVIDd:         3.50 cm   Diastology LVIDs:         1.60 cm   LV e' medial:   3.70 cm/s LV PW:         1.40 cm   LV E/e' medial: 64.3 LV IVS:        1.40 cm LVOT diam:     1.90 cm LV SV:         59 LV SV Index:   34 LVOT Area:     2.84 cm  RIGHT VENTRICLE RV Basal diam:  4.50 cm RV Mid diam:    3.90 cm RV S prime:     7.83 cm/s TAPSE (M-mode): 1.4 cm LEFT ATRIUM             Index        RIGHT ATRIUM           Index LA Vol (A2C):   49.1 ml 28.44 ml/m  RA Area:     16.60 cm LA Vol (A4C):   74.3 ml 43.04 ml/m  RA Volume:   45.50 ml  26.35 ml/m LA Biplane Vol: 64.6 ml 37.42 ml/m  AORTIC VALVE AV Area (Vmax):    1.52 cm AV Area (Vmean):   1.32 cm AV Area (VTI):     1.44 cm AV Vmax:           228.00 cm/s AV Vmean:           170.000 cm/s AV VTI:            0.412 m AV Peak Grad:      20.8 mmHg AV Mean Grad:      13.0 mmHg LVOT Vmax:         122.00 cm/s LVOT Vmean:        79.400 cm/s LVOT VTI:          0.209 m LVOT/AV VTI ratio: 0.51  AORTA Ao Asc diam: 4.00 cm MITRAL VALVE                TRICUSPID VALVE MV Area (PHT): 2.55 cm     TR Peak grad:   49.0 mmHg MV Area VTI:   0.80 cm     TR Vmax:        350.00 cm/s MV Peak grad:  25.6 mmHg MV Mean grad:  11.0 mmHg    SHUNTS MV Vmax:       2.53 m/s  Systemic VTI:  0.21 m MV Vmean:      153.0 cm/s   Systemic Diam: 1.90 cm MV VTI:        0.74 m MV Decel Time: 298 msec MV E velocity: 238.00 cm/s MV A velocity: 152.00 cm/s MV E/A ratio:  1.57 Sunit Tolia Electronically signed by Tessa Lerner Signature Date/Time: 08/28/2023/4:15:29 PM    Final    DG Chest Port 1 View Result Date: 08/27/2023 CLINICAL DATA:  Shortness of breath. EXAM: PORTABLE CHEST 1 VIEW COMPARISON:  Two-view chest x-ray 10/09/2022 FINDINGS: The heart is enlarged. Atherosclerotic calcifications are present at the aortic arch. Left greater than right pleural effusions have increased. Mild interstitial edema is present. Bibasilar airspace opacities are noted, left greater than right. IMPRESSION: 1. Cardiomegaly and mild interstitial edema compatible with congestive heart failure. 2. Left greater than right pleural effusions. 3. Bibasilar airspace disease likely reflects atelectasis. Electronically Signed   By: Marin Roberts [name]   On: 08/27/2023 11:14      Edman Circle, MD 09/25/2023, 11:02 AM  Cc: [name], MD

## 2023-09-25 NOTE — Patient Instructions (Signed)
_______________________________________________________  If your blood pressure at your visit was 140/90 or greater, please contact your primary care physician to follow up on this.  _______________________________________________________  If you are age 78 or older, your body mass index should be between 23-30. Your Body mass index is 25.58 kg/m. If this is out of the aforementioned range listed, please consider follow up with your Primary Care Provider.  If you are age 52 or younger, your body mass index should be between 19-25. Your Body mass index is 25.58 kg/m. If this is out of the aformentioned range listed, please consider follow up with your Primary Care Provider.   ________________________________________________________  The [facility] providers would like to encourage you to use [facility] to communicate with providers for non-urgent requests or questions.  Due to long hold times on the telephone, sending your provider a message by [facility] may be a faster and more efficient way to get a response.  Please allow 48 business hours for a response.  Please remember that this is for non-urgent requests.  _______________________________________________________  Your provider has requested that you go to the basement level for lab work before leaving today. Press "B" on the elevator. The lab is located at the first door on the left as you exit the elevator.  We have sent the following medications to your pharmacy for you to pick up at your convenience: Omeprazole  Please purchase the following medications over the counter and take as directed: Colace every other day   Please follow up in 4 months. Give [place] a call at [phone] to schedule an appointment.  You have been scheduled for a CT scan of the abdomen and pelvis at [facility] ([address] [place], [place], [place] [address]).   You are scheduled on 09-29-2023 at 330pm. You should arrive at 1:15pm for your appointment time for  registration. Please follow the written instructions below on the day of your exam:  WARNING: IF YOU ARE ALLERGIC TO IODINE/X-RAY DYE, PLEASE NOTIFY RADIOLOGY IMMEDIATELY AT [phone]! YOU WILL BE GIVEN A 13 HOUR PREMEDICATION PREP.  1) Do not eat or drink anything after 1130am (4 hours prior to your test) 2) You have been given 2 bottles of oral contrast to drink. The solution may taste better if refrigerated, but do NOT add ice or any other liquid to this solution. Shake well before drinking.    Drink 1 bottle of contrast @ 130pm (2 hours prior to your exam)  Drink 1 bottle of contrast @ 230pm (1 hour prior to your exam)  You may take any medications as prescribed with a small amount of water, if necessary. If you take any of the following medications: METFORMIN, GLUCOPHAGE, GLUCOVANCE, AVANDAMET, RIOMET, FORTAMET, ACTOPLUS MET, JANUMET, GLUMETZA or METAGLIP, you MAY be asked to HOLD this medication 48 hours AFTER the exam.  The purpose of you drinking the oral contrast is to aid in the visualization of your intestinal tract. The contrast solution may cause some diarrhea. Depending on your individual set of symptoms, you may also receive an intravenous injection of x-ray contrast/dye. Plan on being at [facility] for 30 minutes or longer, depending on the type of exam you are having performed.  This test typically takes 30-45 minutes to complete.  If you have any questions regarding your exam or if you need to reschedule, you may call the CT department at [phone] between the hours of 8:00 am and 5:00 pm, Monday-Friday.  ________________________________________________________________________

## 2023-09-26 ENCOUNTER — Ambulatory Visit: Payer: Medicare Other | Admitting: Primary Care

## 2023-09-26 ENCOUNTER — Ambulatory Visit: Payer: Medicare Other | Admitting: Physical Therapy

## 2023-09-26 LAB — CEA: CEA: <2 ng/mL

## 2023-09-27 ENCOUNTER — Encounter: Payer: Medicare Other | Admitting: Physician Assistant

## 2023-09-27 ENCOUNTER — Ambulatory Visit: Payer: Medicare Other | Admitting: Family Medicine

## 2023-09-27 DIAGNOSIS — I87333 Chronic venous hypertension (idiopathic) with ulcer and inflammation of bilateral lower extremity: Secondary | ICD-10-CM | POA: Diagnosis not present

## 2023-09-27 NOTE — Progress Notes (Addendum)
TIFFANY, KUZMENKO ([id number]) [id number] Page 1 of 8 Visit Report for 09/27/2023 Chief Complaint Document Details Patient Name: Date of Service: [name], [name] 09/27/2023 12:30 PM Medical Record Number: [id number] Patient Account Number: [id number] Date of Birth/Sex: Treating RN: [birth year] (77 y.o. [name] Primary Care Provider: [name] Other Clinician: Referring Provider: Treating Provider/Extender: Nonah Mattes Weeks in Treatment: 9 Information Obtained from: Patient Chief Complaint 07/26/2023; new patient visit for wounds on her bilateral lower legs and a more recent abrasion to her right forearm after a fall Electronic Signature(s) Signed: 09/27/2023 1:10:45 PM By: Allen Derry PA-C Entered By: Allen Derry on 09/27/2023 10:10:45 -------------------------------------------------------------------------------- Debridement Details Patient Name: Date of Service: [name] 09/27/2023 12:30 PM Medical Record Number: [id number] Patient Account Number: [id number] Date of Birth/Sex: Treating RN: 08-Jun-1946 (77 y.o. [name] Primary Care Provider: [name] Other Clinician: Referring Provider: Treating Provider/Extender: Nonah Mattes Weeks in Treatment: 9 Debridement Performed for Assessment: Wound #8 Left,Anterior Lower Leg Performed By: Physician Allen Derry, PA-C The following information was scribed by: Midge Aver The information was scribed for: Allen Derry Debridement Type: Debridement Level of Consciousness (Pre-procedure): Awake and Alert Pre-procedure Verification/Time Out Yes - 13:16 Taken: Start Time: 13:16 Percent of Wound Bed Debrided: 50% T Area Debrided (cm): otal 2 Tissue and other material debrided: Viable, Non-Viable, Skin: Dermis , Skin: Epidermis Level: Skin/Epidermis Debridement Description: Selective/Open Wound Instrument: Curette Bleeding:  Minimum Hemostasis Achieved: Pressure Procedural Pain: 0 MARISLEYSIS, ROGGENKAMP ([id number]) [id number] Page 2 of 8 Post Procedural Pain: 0 Response to Treatment: Procedure was tolerated well Level of Consciousness (Post- Awake and Alert procedure): Post Debridement Measurements of Total Wound Length: (cm) 3 Width: (cm) 1.7 Depth: (cm) 0.2 Volume: (cm) 0.801 Character of Wound/Ulcer Post Debridement: Stable Post Procedure Diagnosis Same as Pre-procedure Electronic Signature(s) Signed: 09/29/2023 12:01:12 PM By: Midge Aver MSN RN CNS WTA Signed: 09/29/2023 12:16:54 PM By: Allen Derry PA-C Entered By: Midge Aver on 09/27/2023 10:17:48 -------------------------------------------------------------------------------- HPI Details Patient Name: Date of Service: [name]. 09/27/2023 12:30 PM Medical Record Number: [id number] Patient Account Number: [id number] Date of Birth/Sex: Treating RN: Mar 31, 1946 (77 y.o. [name] Primary Care Provider: [name] Other Clinician: Referring Provider: Treating Provider/Extender: Nonah Mattes Weeks in Treatment: 9 History of Present Illness HPI Description: 07/26/2023 ADMISSION This is a 77 year old woman who has been referred for wounds on her bilateral lower legs by her primary physician. She was last seen in primary care on 06/15/2023 at which time she had an abrasion injury to the left anterior shin right medial leg and some scabbing over the left lateral malleolus. She also had bilateral heel issues although they were not open today. A culture of 1 of these wounds showed Pseudomonas I am not exactly sure what antibiotic she received. She had arterial studies done on 07/13/2023 that showed an ABI on the right of 1.25 TBI 1.08 with biphasic and triphasic waveforms. On the left ABI I was 1.29 TBI at 0.92 and triphasic waveforms. All of this suggests that her arterial flow was quite  good. The patient states that she has on and off wounds in her bilateral lower extremities for at least a year. The areas will heal and then reopen. She has been prescribed compression stockings in the past but she cannot get them on. She lives independently. A week ago she had a fall at home causing a skin tear on the right dorsal arm. 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 Her wounds are a lot better with simple compression of an Urgo K2 light. I think she is going to require ongoing compression stockings. She reminds me that she cannot get standard over the toe stockings on. We will look at external compression stockings The area on the right anterior arm we use Xeroform to last week. This apparently stuck to the wound she seemed particularly cross about this. She wants to continue to use triamcinolone and T on it. I did not argue this point elfa 10/30; patient with severe chronic venous insufficiency in her bilateral lower legs. Her wounds have essentially close she does have some eschared areas but nothing is open. She has a single JuxtaLite stocking but we have prior authorization for a second juxta lite stocking. She cannot get standard stockings on her legs. She also had an abrasion on the right arm. This is still open. She has been using triamcinolone on this with a nonstick pad. We tried Vaseline gauze, Xeroform but according to the patient and the stuck to the wound bed. I wonder if the triamcinolone is actually inhibiting healing of this at this point. 09-18-2023 upon evaluation today patient appears to be doing well currently in regard to her wounds. She has been tolerating the dressing changes without complication. 09-27-2023 upon evaluation today patient appears to be doing well currently in  regard to her wound. She has been tolerating the dressing changes all wounds ALMEE, LOCKLIN ([id number]) [phone].pdf Page 3 of 8 appear to be pretty much about healed except for her skin tear on the elbow which is all but healed. She also notes that the left anterior shin is new this is a wound that actually occurred as a result of her put her stockings on. She had a small skin tear over the fingernail because of this. It does not appear to be too deep this can require little bit of trimming of the skin. Electronic Signature(s) Signed: 09/27/2023 1:35:29 PM By: Allen Derry PA-C Entered By: Allen Derry on 09/27/2023 10:35:29 -------------------------------------------------------------------------------- Physical Exam Details Patient Name: Date of Service: ACADIA, PAHLS 09/27/2023 12:30 PM Medical Record Number: [id number] Patient Account Number: [id number] Date of Birth/Sex: Treating RN: 03-23-1946 (77 y.o. [name] Primary Care Provider: [name] Other Clinician: Referring Provider: Treating Provider/Extender: Nonah Mattes Weeks in Treatment: 9 Constitutional Well-nourished and well-hydrated in no acute distress. Respiratory normal breathing without difficulty. Psychiatric this patient is able to make decisions and demonstrates good insight into disease process. Alert and Oriented x 3. pleasant and cooperative. Notes Upon inspection patient's wound bed actually showed signs of good granulation and epithelization at this point. Fortunately I do not see any signs of active infection at this time which is great news and in general I do believe that she is doing well I did perform debridement just to clear away some of the skin that was necrotic from the skin tear off of the wound on the left leg and she tolerated that without complication the wound on the elbow is pretty much about healed there is a 0.1 open areas no  debridement necessary. Electronic Signature(s) Signed: 09/27/2023 1:36:09 PM By: Allen Derry PA-C Entered By: Allen Derry on 09/27/2023 10:36:09 -------------------------------------------------------------------------------- Physician Orders Details Patient Name: Date of Service: [name] 09/27/2023 12:30 PM Medical Record Number: [id number] Patient  Account Number: [id number] Date of Birth/Sex: Treating RN: 1946-10-05 (77 y.o. [name] Primary Care Provider: [name] Other Clinician: Referring Provider: Treating Provider/Extender: Nonah Mattes Weeks in Treatment: 9 Verbal / Phone Orders: No Diagnosis [address] LAKERIA, KNABEL ([id number]) [id number] Page 4 of 8 ICD-10 Coding Code Description [phone] Chronic venous hypertension (idiopathic) with ulcer and inflammation of bilateral lower extremity L97.821 Non-pressure chronic ulcer of other part of left lower leg limited to breakdown of skin L97.818 Non-pressure chronic ulcer of other part of right lower leg with other specified severity L97.328 Non-pressure chronic ulcer of left ankle with other specified severity S40.811D Abrasion of right upper arm, subsequent encounter Follow-up Appointments Return Appointment in 2 weeks. Bathing/ [employer] wounds with antibacterial soap and water. May shower; gently cleanse wound with antibacterial soap, rinse and pat dry prior to dressing wounds No tub bath. Anesthetic (Use 'Patient Medications' Section for Anesthetic Order Entry) Lidocaine applied to wound bed Edema Control - Orders / Instructions Patient to wear own Velcro compression garment. Remove compression stockings every night before going to bed and put on every morning when getting up. - Left lower leg Other: - Juxta Lite Wound Treatment Wound #5 - Elbow Wound Laterality: Right Cleanser: Soap and Water 1 x Per Day/15 Days Discharge Instructions:  Gently cleanse wound with antibacterial soap, rinse and pat dry prior to dressing wounds Cleanser: Vashe 5.8 (oz) 1 x Per Day/15 Days Discharge Instructions: Use vashe 5.8 (oz) as directed Prim Dressing: Xeroform 4x4-HBD (in/in) 1 x Per Day/15 Days ary Discharge Instructions: Apply Xeroform 4x4-HBD (in/in) as directed Secondary Dressing: Coverlet Latex-Free Fabric Adhesive Dressings 1 x Per Day/15 Days Discharge Instructions: 1.5 x 2 Wound #8 - Lower Leg Wound Laterality: Left, Anterior Cleanser: Soap and Water 1 x Per Day/15 Days Discharge Instructions: Gently cleanse wound with antibacterial soap, rinse and pat dry prior to dressing wounds Cleanser: Vashe 5.8 (oz) 1 x Per Day/15 Days Discharge Instructions: Use vashe 5.8 (oz) as directed Prim Dressing: Xeroform 4x4-HBD (in/in) 1 x Per Day/15 Days ary Discharge Instructions: Apply Xeroform 4x4-HBD (in/in) as directed Secondary Dressing: Coverlet Latex-Free Fabric Adhesive Dressings 1 x Per Day/15 Days Discharge Instructions: 1.5 x 2 Electronic Signature(s) Signed: 09/29/2023 12:01:12 PM By: Midge Aver MSN RN CNS WTA Signed: 09/29/2023 12:16:54 PM By: Allen Derry PA-C Entered By: Midge Aver on 09/27/2023 10:20:02 Problem List Details -------------------------------------------------------------------------------- Robin Searing ([id number]) [id number] Page 5 of 8 Patient Name: Date of Service: EFSTATHIA, YAROCH 09/27/2023 12:30 PM Medical Record Number: [id number] Patient Account Number: [id number] Date of Birth/Sex: Treating RN: April 30, 1946 (77 y.o. 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 of right upper arm, subsequent encounter 07/26/2023 No Yes Inactive Problems Resolved Problems Electronic Signature(s) Signed: 09/29/2023 12:01:12 PM By: Midge Aver MSN RN CNS WTA Signed: 09/29/2023 12:16:54 PM By: Allen Derry PA-C Previous Signature: 09/27/2023 1:10:30 PM Version By: Allen Derry PA-C Entered By: Midge Aver on 09/27/2023  10:21:38 -------------------------------------------------------------------------------- Progress Note Details Patient Name: Date of Service: QUINESHA, THEIN 09/27/2023 12:30 PM Medical Record Number: [id number] Patient Account Number: [id number] Date of Birth/Sex: Treating RN: 10-26-1945 (77 y.o. [name] Primary Care Provider: [name] Other Clinician: Referring Provider: Treating Provider/Extender: Nonah Mattes Weeks in Treatment: 9 Subjective Chief Complaint Information obtained from Patient 07/26/2023; new patient visit for wounds on her bilateral lower legs and a more recent abrasion to her right forearm after a fall WILLIDEAN, STAGNITTA ([id number]) [phone].pdf Page 6 of 8 History of Present Illness (HPI) 07/26/2023 ADMISSION This is a 77 year old woman who has been referred for wounds on her bilateral lower legs by her primary physician. She was last seen in primary care on 06/15/2023 at which time she had an abrasion injury to the left anterior shin right medial leg and some scabbing over the left lateral malleolus. She also had bilateral heel issues although they were not open today. A culture of 1 of these  wounds showed Pseudomonas I am not exactly sure what antibiotic she received. She had arterial studies done on 07/13/2023 that showed an ABI on the right of 1.25 TBI 1.08 with biphasic and triphasic waveforms. On the left ABI I was 1.29 TBI at 0.92 and triphasic waveforms. All of this suggests that her arterial flow was quite good. The patient states that she has on and off wounds in her bilateral lower extremities for at least a year. The areas will heal and then reopen. She has been prescribed compression stockings in the past but she cannot get them on. She lives independently. A week ago she had a fall at home causing a skin tear on the right dorsal arm. 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 Her wounds are a lot better with simple compression of an Urgo K2 light. I think she is going to require ongoing compression stockings. She reminds me that she cannot get standard over the toe stockings on. We will look at external compression stockings The area on the right anterior arm we use Xeroform to last week. This apparently stuck to the wound she seemed particularly cross about this. She wants to continue to use triamcinolone and T on it. I did not argue this point elfa 10/30; patient with severe chronic venous insufficiency in her bilateral lower legs. Her wounds have essentially close she does have some eschared areas but nothing is open. She has a single JuxtaLite stocking but we have prior authorization for a second juxta lite stocking. She cannot get standard stockings on her legs. She also had an abrasion on the right arm. This is still open. She has been using triamcinolone on this with a nonstick pad. We tried Vaseline gauze, Xeroform but according to the patient and the stuck to the wound bed. I wonder  if the triamcinolone is actually inhibiting healing of this at this point. 09-18-2023 upon evaluation today patient appears to be doing well currently in regard to her wounds. She has been tolerating the dressing changes without complication. 09-27-2023 upon evaluation today patient appears to be doing well currently in regard to her wound. She has been tolerating the dressing changes all wounds appear to be pretty much about healed except for her skin tear on the elbow which is all but healed. She also notes that the left anterior shin is new this  is a wound that actually occurred as a result of her put her stockings on. She had a small skin tear over the fingernail because of this. It does not appear to be too deep this can require little bit of trimming of the skin. Objective Constitutional Well-nourished and well-hydrated in no acute distress. Vitals Time Taken: 12:49 PM, Height: 63 in, Weight: 141.8 lbs, BMI: 25.1, Temperature: 97.7 F, Pulse: 90 bpm, Respiratory Rate: 18 breaths/min, Blood Pressure: 115/69 mmHg. Respiratory normal breathing without difficulty. Psychiatric this patient is able to make decisions and demonstrates good insight into disease process. Alert and Oriented x 3. pleasant and cooperative. General Notes: Upon inspection patient's wound bed actually showed signs of good granulation and epithelization at this point. Fortunately I do not see any signs of active infection at this time which is great news and in general I do believe that she is doing well I did perform debridement just to clear away some of the skin that was necrotic from the skin tear off of the wound on the left leg and she tolerated that without complication the wound on the elbow is pretty much about healed there is a 0.1 open areas no debridement necessary. Integumentary (Hair, Skin) Wound #4 status is Healed - Epithelialized. Original cause of wound was Skin Tear/Laceration. The date acquired was:  07/19/2023. The wound has been in treatment 9 weeks. The wound is located on the Right,Anterior Forearm. The wound measures 0cm length x 0cm width x 0cm depth; 0cm^2 area and 0cm^3 volume. There is Fat Layer (Subcutaneous Tissue) exposed. There is a large amount of sanguinous drainage noted. There is medium (34-66%) red granulation within the wound bed. Wound #5 status is Open. Original cause of wound was Skin T ear/Laceration. The date acquired was: 09/10/2023. The wound has been in treatment 1 weeks. The wound is located on the Right Elbow. The wound measures 0.3cm length x 0.5cm width x 0.1cm depth; 0.118cm^2 area and 0.012cm^3 volume. There is Fat Layer (Subcutaneous Tissue) exposed. There is a medium amount of serosanguineous drainage noted. There is medium (34-66%) red granulation within the wound bed. There is no necrotic tissue within the wound bed. Wound #6 status is Healed - Epithelialized. Original cause of wound was Skin Tear/Laceration. The date acquired was: 09/10/2023. The wound has been in treatment 1 weeks. The wound is located on the Right,Anterior Lower Leg. The wound measures 0cm length x 0cm width x 0cm depth; 0cm^2 area and 0cm^3 volume. There is Fat Layer (Subcutaneous Tissue) exposed. There is a medium amount of serosanguineous drainage noted. There is medium (34-66%) red granulation within the wound bed. Wound #7 status is Healed - Epithelialized. Original cause of wound was Pressure Injury. The date acquired was: 09/10/2023. The wound has been in treatment 1 weeks. The wound is located on the Left,Lateral Ankle. The wound measures 0cm length x 0cm width x 0cm depth; 0cm^2 area and 0cm^3 volume. There is a none present amount of drainage noted. There is medium (34-66%) pale granulation within the wound bed. There is no necrotic tissue within the wound bed. Wound #8 status is Open. Original cause of wound was Gradually Appeared. The date acquired was: 09/21/2023. The wound is  located on the Left,Anterior Lower Leg. The wound measures 3cm length x 1.7cm width x 0.2cm depth; 4.006cm^2 area and 0.801cm^3 volume. There is Fat Layer (Subcutaneous Tissue) exposed. There is no tunneling or undermining noted. There is a medium amount of serosanguineous drainage noted. There is medium (34-66%)  red, pink NALIAH, KEESLING ([id number]) [phone].pdf Page 7 of 8 granulation within the wound bed. There is no necrotic tissue within the wound bed. Assessment Active Problems ICD-10 Chronic venous hypertension (idiopathic) with ulcer and inflammation of bilateral lower extremity Non-pressure chronic ulcer of other part of left lower leg limited to breakdown of skin Non-pressure chronic ulcer of other part of right lower leg with other specified severity Non-pressure chronic ulcer of left ankle with other specified severity Abrasion of right upper arm, subsequent encounter Procedures Wound #8 Pre-procedure diagnosis of Wound #8 is a Skin T located on the Left,Anterior Lower Leg . There was a Selective/Open Wound Skin/Epidermis Debridement ear with a total area of 2 sq cm performed by Allen Derry, PA-C. With the following instrument(s): Curette to remove Viable and Non-Viable tissue/material. Material removed includes Skin: Dermis and Skin: Epidermis and. No specimens were taken. A time out was conducted at 13:16, prior to the start of the procedure. A Minimum amount of bleeding was controlled with Pressure. The procedure was tolerated well with a pain level of 0 throughout and a pain level of 0 following the procedure. Post Debridement Measurements: 3cm length x 1.7cm width x 0.2cm depth; 0.801cm^3 volume. Character of Wound/Ulcer Post Debridement is stable. Post procedure Diagnosis Wound #8: Same as Pre-Procedure Plan Follow-up Appointments: Return Appointment in 2 weeks. Bathing/ Shower/ Hygiene: Wash wounds with antibacterial soap and water. May  shower; gently cleanse wound with antibacterial soap, rinse and pat dry prior to dressing wounds No tub bath. Anesthetic (Use 'Patient Medications' Section for Anesthetic Order Entry): Lidocaine applied to wound bed Edema Control - Orders / Instructions: Patient to wear own Velcro compression garment. Remove compression stockings every night before going to bed and put on every morning when getting up. - Left lower leg Other: - Juxta Lite WOUND #5: - Elbow Wound Laterality: Right Cleanser: Soap and Water 1 x Per Day/15 Days Discharge Instructions: Gently cleanse wound with antibacterial soap, rinse and pat dry prior to dressing wounds Cleanser: Vashe 5.8 (oz) 1 x Per Day/15 Days Discharge Instructions: Use vashe 5.8 (oz) as directed Prim Dressing: Xeroform 4x4-HBD (in/in) 1 x Per Day/15 Days ary Discharge Instructions: Apply Xeroform 4x4-HBD (in/in) as directed Secondary Dressing: Coverlet Latex-Free Fabric Adhesive Dressings 1 x Per Day/15 Days Discharge Instructions: 1.5 x 2 WOUND #8: - Lower Leg Wound Laterality: Left, Anterior Cleanser: Soap and Water 1 x Per Day/15 Days Discharge Instructions: Gently cleanse wound with antibacterial soap, rinse and pat dry prior to dressing wounds Cleanser: Vashe 5.8 (oz) 1 x Per Day/15 Days Discharge Instructions: Use vashe 5.8 (oz) as directed Prim Dressing: Xeroform 4x4-HBD (in/in) 1 x Per Day/15 Days ary Discharge Instructions: Apply Xeroform 4x4-HBD (in/in) as directed Secondary Dressing: Coverlet Latex-Free Fabric Adhesive Dressings 1 x Per Day/15 Days Discharge Instructions: 1.5 x 2 1. I recommend that we have the patient continue to monitor for any signs of infection or worsening. I do believe that she is doing well on thinking she is very close to complete resolution. 2. I am also can recommend the patient should continue with the juxta lite compression wraps I think this is gena be a good way to go to help keep her swelling under control  and I will be much easier for her which should hopefully prevent any future injury. We will see patient back for reevaluation in 1 week here in the clinic. If anything worsens or changes patient will contact our office for additional recommendations. Electronic Signature(s) Signed:  09/27/2023 1:36:45 PM By: Allen Derry PA-C Entered By: Allen Derry on 09/27/2023 10:36:45 Robin Searing ([id number]) [id number] Page 8 of 8 -------------------------------------------------------------------------------- SuperBill Details Patient Name: Date of Service: ASHANTE, RODRIGUEZ 09/27/2023 Medical Record Number: [id number] Patient Account Number: [id number] Date of Birth/Sex: Treating RN: Sep 24, 1946 (77 y.o. 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 S40.811D Abrasion of right upper arm, subsequent encounter Facility Procedures : CPT4 Code: [id number] Description: [id number] - DEBRIDE WOUND 1ST 20 SQ CM OR < ICD-10 Diagnosis Description 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

## 2023-09-29 ENCOUNTER — Ambulatory Visit: Payer: Medicare Other | Admitting: Physical Therapy

## 2023-09-29 ENCOUNTER — Ambulatory Visit (HOSPITAL_COMMUNITY)
Admission: RE | Admit: 2023-09-29 | Discharge: 2023-09-29 | Disposition: A | Discharge status: Home / Self Care | Payer: Medicare Other | Source: Ambulatory Visit | Attending: Gastroenterology | Admitting: Gastroenterology

## 2023-09-29 ENCOUNTER — Encounter (HOSPITAL_COMMUNITY): Payer: Self-pay

## 2023-09-29 DIAGNOSIS — C20 Malignant neoplasm of rectum: Secondary | ICD-10-CM | POA: Diagnosis present

## 2023-09-29 DIAGNOSIS — R195 Other fecal abnormalities: Secondary | ICD-10-CM | POA: Insufficient documentation

## 2023-09-29 MED ORDER — IOHEXOL 300 MG/ML  SOLN
100.0000 mL | Freq: Once | INTRAMUSCULAR | Status: AC | PRN
  Administered 2023-09-29: 100 mL via INTRAVENOUS

## 2023-09-29 MED ORDER — IOHEXOL 300 MG/ML  SOLN
30.0000 mL | Freq: Once | INTRAMUSCULAR | Status: AC | PRN
  Administered 2023-09-29: 30 mL via INTRAVENOUS

## 2023-09-30 NOTE — Progress Notes (Signed)
QUNISHA, HARPSTER ([id number]) [id number] Page 1 of 14 Visit Report for 09/27/2023 Arrival Information Details Patient Name: Date of Service: [name], [name] 09/27/2023 12:30 PM Medical Record Number: [id number] Patient Account Number: [id number] Date of Birth/Sex: Treating RN: 10/11/1945 (77 y.o. [name] Primary Care Jaydeen Odor: [name] Other Clinician: Referring Zamaria Brazzle: Treating [place] Inthavong/Extender: Haze Boyden in Treatment: 9 Visit Information History Since Last Visit Added or deleted any medications: No Patient Arrived: Gilmer Mor Any new allergies or adverse reactions: No Arrival Time: 12:43 Had a fall or experienced change in No Accompanied By: self activities of daily living that may affect Transfer Assistance: None risk of falls: Patient Identification Verified: Yes Hospitalized since last visit: No Secondary Verification Process Completed: Yes Has Dressing in Place as Prescribed: Yes Patient Requires Transmission-Based Precautions: No Pain Present Now: No Patient Has Alerts: Yes Patient Alerts: Patient on Blood Thinner eliquis R ABI 1.25 TBI 1.08 L ABI 1.29 TBI 0.92 PACEMAKER Electronic Signature(s) Signed: 09/29/2023 12:01:12 PM By: Midge Aver MSN RN CNS WTA Entered By: Midge Aver on 09/27/2023 09:49:45 -------------------------------------------------------------------------------- Clinic Level of Care Assessment Details Patient Name: Date of Service: [name], [name] 09/27/2023 12:30 PM Medical Record Number: [id number] Patient Account Number: [id number] Date of Birth/Sex: Treating RN: Apr 30, 1946 (77 y.o. [name] Primary Care Kataryna Mcquilkin: [name] Other Clinician: Referring Emonii Wienke: Treating Braulio Kiedrowski/Extender: Nonah Mattes Weeks in Treatment: 9 Clinic Level of Care Assessment Items TOOL 1 Quantity Score []  - 0 Use when EandM and Procedure is performed on  INITIAL visit ASSESSMENTS - Nursing Assessment / Reassessment []  - 0 General Physical Exam (combine w/ comprehensive assessment (listed just below) when performed on new pt. evals) []  - 0 Comprehensive Assessment (HX, ROS, Risk Assessments, Wounds Hx, etc.) [name], [name] ([id number]) [phone].pdf Page 2 of 14 ASSESSMENTS - Wound and Skin Assessment / Reassessment []  - 0 Dermatologic / Skin Assessment (not related to wound area) ASSESSMENTS - Ostomy and/or Continence Assessment and Care []  - 0 Incontinence Assessment and Management []  - 0 Ostomy Care Assessment and Management (repouching, etc.) PROCESS - Coordination of Care []  - 0 Simple Patient / Family Education for ongoing care []  - 0 Complex (extensive) Patient / Family Education for ongoing care []  - 0 Staff obtains Chiropractor, Records, T Results / Process Orders est []  - 0 Staff telephones HHA, Nursing Homes / Clarify orders / etc []  - 0 Routine Transfer to another Facility (non-emergent condition) []  - 0 Routine Hospital Admission (non-emergent condition) []  - 0 New Admissions / Manufacturing engineer / Ordering NPWT Apligraf, etc. , []  - 0 Emergency Hospital Admission (emergent condition) PROCESS - Special Needs []  - 0 Pediatric / Minor Patient Management []  - 0 Isolation Patient Management []  - 0 Hearing / Language / Visual special needs []  - 0 Assessment of Community assistance (transportation, D/C planning, etc.) []  - 0 Additional assistance / Altered mentation []  - 0 Support Surface(s) Assessment (bed, cushion, seat, etc.) INTERVENTIONS - Miscellaneous []  - 0 External ear exam []  - 0 Patient Transfer (multiple staff / Nurse, adult / Similar devices) []  - 0 Simple Staple / Suture removal (25 or less) []  - 0 Complex Staple / Suture removal (26 or more) []  - 0 Hypo/Hyperglycemic Management (do not check if billed separately) []  - 0 Ankle / Brachial Index (ABI) - do not  check if billed separately Has the patient been seen at the hospital within the last three years: Yes Total Score: 0 Level Of  Care: ____ Electronic Signature(s) Signed: 09/29/2023 12:01:12 PM By: Midge Aver MSN RN CNS WTA Entered By: Midge Aver on 09/27/2023 10:20:10 -------------------------------------------------------------------------------- Encounter Discharge Information Details Patient Name: Date of Service: [name] 09/27/2023 12:30 PM Medical Record Number: [id number] Patient Account Number: [id number] Date of Birth/Sex: Treating RN: 11-18-1945 (77 y.o. [name] Primary Care Tremaine Earwood: [name] Other Clinician: Referring Joram Venson: Treating Kieanna Rollo/Extender: Haze Boyden in Treatment: [address], [place] ([id number]) [id number] Page 3 of 14 Encounter Discharge Information Items Post Procedure Vitals Discharge Condition: Stable Temperature (F): 97.7 Ambulatory Status: Cane Pulse (bpm): 90 Discharge Destination: Home Respiratory Rate (breaths/min): 18 Transportation: Private Auto Blood Pressure (mmHg): 115/69 Accompanied By: self Schedule Follow-up Appointment: Yes Clinical Summary of Care: Electronic Signature(s) Signed: 09/29/2023 12:01:12 PM By: Midge Aver MSN RN CNS WTA Entered By: Midge Aver on 09/27/2023 10:22:35 -------------------------------------------------------------------------------- Lower Extremity Assessment Details Patient Name: Date of Service: [name] 09/27/2023 12:30 PM Medical Record Number: [id number] Patient Account Number: [id number] Date of Birth/Sex: Treating RN: January 27, 1946 (77 y.o. [name] Primary Care Lesha Jager: [name] Other Clinician: Referring Nimco Bivens: Treating Vielka Klinedinst/Extender: Nonah Mattes Weeks in Treatment: 9 Edema Assessment Assessed: Kyra Searles: Yes] Franne Forts: Yes] Edema: [Left: No] [Right:  No] Calf Left: Right: Point of Measurement: 32 cm From Medial Instep 29 cm 32 cm Ankle Left: Right: Point of Measurement: 12 cm From Medial Instep 20 cm 20.5 cm Knee To Floor Left: Right: From Medial Instep 36 cm 36 cm Vascular Assessment Pulses: Dorsalis Pedis Palpable: [Left:Yes] [Right:Yes] Extremity colors, hair growth, and conditions: Extremity Color: [Left:Hyperpigmented] [Right:Hyperpigmented] Hair Growth on Extremity: [Left:No] [Right:No] Temperature of Extremity: [Left:Warm] [Right:Warm] Capillary Refill: [Left:< 3 seconds] [Right:< 3 seconds] Dependent Rubor: [Left:No] Blanched when Elevated: [Left:No No] [Right:No No] Toe Nail Assessment Left: Right: Thick: No No Discolored: No No Deformed: No No [name], [name] ([id number]) [id number] Page 4 of 14 Improper Length and Hygiene: No No Electronic Signature(s) Signed: 09/29/2023 12:01:12 PM By: Midge Aver MSN RN CNS WTA Entered By: Midge Aver on 09/27/2023 10:15:44 -------------------------------------------------------------------------------- Multi Wound Chart Details Patient Name: Date of Service: [name] 09/27/2023 12:30 PM Medical Record Number: [id number] Patient Account Number: [id number] Date of Birth/Sex: Treating RN: 01/06/1946 (77 y.o. [name] Primary Care Leilanny Fluitt: [name] Other Clinician: Referring Nasia Cannan: Treating Athen Riel/Extender: Nonah Mattes Weeks in Treatment: 9 Vital Signs Height(in): 63 Pulse(bpm): 90 Weight(lbs): 141.8 Blood Pressure(mmHg): 115/69 Body Mass Index(BMI): 25.1 Temperature(F): 97.7 Respiratory Rate(breaths/min): 18 [4:Photos:] Right, Anterior Forearm Right Elbow Right, Anterior Lower Leg Wound Location: Skin T ear/Laceration Skin T ear/Laceration Skin T ear/Laceration Wounding Event: Skin T ear Skin T ear Skin T ear Primary Etiology: Chronic Obstructive Pulmonary Chronic Obstructive  Pulmonary Chronic Obstructive Pulmonary Comorbid History: Disease (COPD), Hypertension Disease (COPD), Hypertension Disease (COPD), Hypertension 07/19/2023 09/10/2023 09/10/2023 Date Acquired: 9 1 1  Weeks of Treatment: Healed - Epithelialized Open Healed - Epithelialized Wound Status: No No No Wound Recurrence: No No No Clustered Wound: 0x0x0 0.3x0.5x0.1 0x0x0 Measurements L x W x D (cm) 0 0.118 0 A (cm) : Diane 0 0.012 0 Volume (cm) : 100.00% 70.00% 100.00% % Reduction in Area: 100.00% 69.20% 100.00% % Reduction in Volume: Full Thickness Without Exposed Full Thickness Without Exposed Full Thickness Without Exposed Classification: Support Structures Support Structures Support Structures Large Medium Medium Exudate Amount: Sanguinous Serosanguineous Serosanguineous Exudate Type: red red, brown red, brown Exudate Color: Medium (34-66%) Medium (34-66%) Medium (34-66%) Granulation Amount: Red Red Red  Granulation Quality: N/A None Present (0%) N/A Necrotic Amount: Fat Layer (Subcutaneous Tissue): Yes Fat Layer (Subcutaneous Tissue): Yes Fat Layer (Subcutaneous Tissue): Yes Exposed Structures: Fascia: No Fascia: No Fascia: No Tendon: No Tendon: No Tendon: No Muscle: No Muscle: No Muscle: No Joint: No Joint: No Joint: No Bone: No Bone: No Bone: No Small (1-33%) Medium (34-66%) Medium (34-66%) EpithelializationPRIANKA, [name] ([id number]) [id number] Page 5 of 14 Wound Number: 7 8 N/A Photos: N/A Left, Lateral Ankle Left, Anterior Lower Leg N/A Wound Location: Pressure Injury Gradually Appeared N/A Wounding Event: Pressure Ulcer Skin T ear N/A Primary Etiology: Chronic Obstructive Pulmonary Chronic Obstructive Pulmonary N/A Comorbid History: Disease (COPD), Hypertension Disease (COPD), Hypertension 09/10/2023 09/21/2023 N/A Date Acquired: 1 0 N/A Weeks of Treatment: Healed - Epithelialized Open N/A Wound Status: No No  N/A Wound Recurrence: No Yes N/A Clustered Wound: 0x0x0 3x1.7x0.2 N/A Measurements L x W x D (cm) 0 4.006 N/A A (cm) : Diane 0 0.801 N/A Volume (cm) : 100.00% N/A N/A % Reduction in Area: 100.00% N/A N/A % Reduction in Volume: Category/Stage II Full Thickness Without Exposed N/A Classification: Support Structures None Present Medium N/A Exudate Amount: N/A Serosanguineous N/A Exudate Type: N/A red, brown N/A Exudate Color: Medium (34-66%) Medium (34-66%) N/A Granulation Amount: Pale Red, Pink N/A Granulation Quality: None Present (0%) None Present (0%) N/A Necrotic Amount: Fascia: No Fat Layer (Subcutaneous Tissue): Yes N/A Exposed Structures: Fat Layer (Subcutaneous Tissue): No Fascia: No Tendon: No Tendon: No Muscle: No Muscle: No Joint: No Joint: No Bone: No Bone: No N/A None N/A Epithelialization: Treatment Notes Electronic Signature(s) Signed: 09/29/2023 12:01:12 PM By: Midge Aver MSN RN CNS WTA Entered By: Midge Aver on 09/27/2023 10:15:50 -------------------------------------------------------------------------------- Multi-Disciplinary Care Plan Details Patient Name: Date of Service: [name]. 09/27/2023 12:30 PM Medical Record Number: [id number] Patient Account Number: [id number] Date of Birth/Sex: Treating RN: [birth year] (77 y.o. [name] Primary Care Theodus Ran: [name] Other Clinician: Referring Christne Platts: Treating Dondi Burandt/Extender: Nonah Mattes Weeks in Treatment: 9 Active Inactive Wound/Skin Impairment Nursing Diagnoses: Impaired tissue integrity [name], [name] ([id number]) [phone].pdf Page 6 of 14 Knowledge deficit related to ulceration/compromised skin integrity Goals: Patient/caregiver will verbalize understanding of skin care regimen Date Initiated: 07/26/2023 Date Inactivated: 09/18/2023 Target Resolution Date: 08/26/2023 Goal Status: Met Ulcer/skin breakdown  will have a volume reduction of 30% by week 4 Date Initiated: 07/26/2023 Date Inactivated: 09/18/2023 Target Resolution Date: 08/26/2023 Goal Status: Met Ulcer/skin breakdown will have a volume reduction of 50% by week 8 Date Initiated: 07/26/2023 Date Inactivated: 09/27/2023 Target Resolution Date: 09/25/2023 Goal Status: Met Ulcer/skin breakdown will have a volume reduction of 80% by week 12 Date Initiated: 07/26/2023 Target Resolution Date: 10/26/2023 Goal Status: Active Ulcer/skin breakdown will heal within 14 weeks Date Initiated: 07/26/2023 Target Resolution Date: 11/09/2023 Goal Status: Active Interventions: Assess patient/caregiver ability to obtain necessary supplies Assess patient/caregiver ability to perform ulcer/skin care regimen upon admission and as needed Assess ulceration(s) every visit Provide education on ulcer and skin care Treatment Activities: Referred to DME Taila Basinski for dressing supplies : 07/26/2023 Skin care regimen initiated : 07/26/2023 Notes: Electronic Signature(s) Signed: 09/29/2023 12:01:12 PM By: Midge Aver MSN RN CNS WTA Entered By: Midge Aver on 09/27/2023 10:20:34 -------------------------------------------------------------------------------- Pain Assessment Details Patient Name: Date of Service: [name] 09/27/2023 12:30 PM Medical Record Number: [id number] Patient Account Number: [id number] Date of Birth/Sex: Treating RN: [birth year] (77 y.o. [name] Primary Care Kaelon Weekes: [name] Other Clinician: Referring Tangy Drozdowski: Treating  Soo Steelman/Extender: Nonah Mattes Weeks in Treatment: 9 Active Problems Location of Pain Severity and Description of Pain Patient Has Paino No Site Locations [name], [name] ([id number]) [id number] Page 7 of 14 Pain Management and Medication Current Pain Management: Electronic Signature(s) Signed: 09/29/2023 12:01:12 PM By: Midge Aver  MSN RN CNS WTA Entered By: Midge Aver on 09/27/2023 09:52:06 -------------------------------------------------------------------------------- Patient/Caregiver Education Details Patient Name: Date of Service: [name] 12/18/2024andnbsp12:30 PM Medical Record Number: [id number] Patient Account Number: [id number] Date of Birth/Gender: Treating RN: Mar 08, 1946 (77 y.o. [name] Primary Care Physician: [name] Other Clinician: Referring Physician: Treating Physician/Extender: Haze Boyden in Treatment: 9 Education Assessment Education Provided To: Patient Education Topics Provided Wound Debridement: Handouts: Wound Debridement Methods: Explain/Verbal Responses: State content correctly Wound/Skin Impairment: Handouts: Caring for Your Ulcer Methods: Explain/Verbal Responses: State content correctly Notes Instructions given on proper application of [employer] Electronic Signature(s) Signed: 09/29/2023 12:01:12 PM By: Midge Aver MSN RN CNS WTA Entered By: Midge Aver on 09/27/2023 10:38:26 Robin Searing ([id number]) [id number] Page 8 of 14 -------------------------------------------------------------------------------- Wound Assessment Details Patient Name: Date of Service: [name], [name] 09/27/2023 12:30 PM Medical Record Number: [id number] Patient Account Number: [id number] Date of Birth/Sex: Treating RN: 05-16-1946 (77 y.o. [name] Primary Care Trenton Verne: [name] Other Clinician: Referring Kawthar Ennen: Treating Nalina Yeatman/Extender: Nonah Mattes Weeks in Treatment: 9 Wound Status Wound Number: 4 Primary Skin Tear Etiology: Wound Location: Right, Anterior Forearm Wound Status: Healed - Epithelialized Wounding Event: Skin Tear/Laceration Comorbid Chronic Obstructive Pulmonary Disease (COPD), Date Acquired: 07/19/2023 History: Hypertension Weeks Of Treatment:  9 Clustered Wound: No Photos Wound Measurements Length: (cm) Width: (cm) Depth: (cm) Area: (cm) Volume: (cm) 0 % Reduction in Area: 100% 0 % Reduction in Volume: 100% 0 Epithelialization: Small (1-33%) 0 0 Wound Description Classification: Full Thickness Without Exposed Suppor Exudate Amount: Large Exudate Type: Sanguinous Exudate Color: red t Structures Foul Odor After Cleansing: No Slough/Fibrino No Wound Bed Granulation Amount: Medium (34-66%) Exposed Structure Granulation Quality: Red Fascia Exposed: No Fat Layer (Subcutaneous Tissue) Exposed: Yes Tendon Exposed: No Muscle Exposed: No Joint Exposed: No Bone Exposed: No Treatment Notes Wound #4 (Forearm) Wound Laterality: Right, Anterior Cleanser Peri-Wound Care RYENNE, CLIFFORD ([id number]) [id number] Page 9 of 14 Topical Primary Dressing Secondary Dressing Secured With Compression Wrap Compression Stockings Add-Ons Electronic Signature(s) Signed: 09/29/2023 12:01:12 PM By: Midge Aver MSN RN CNS WTA Entered By: Midge Aver on 09/27/2023 10:15:26 -------------------------------------------------------------------------------- Wound Assessment Details Patient Name: Date of Service: [name] 09/27/2023 12:30 PM Medical Record Number: [id number] Patient Account Number: [id number] Date of Birth/Sex: Treating RN: 05-Jan-1946 (77 y.o. [name] Primary Care Oshen Wlodarczyk: [name] Other Clinician: Referring Cordarius Benning: Treating Brynn Mulgrew/Extender: Nonah Mattes Weeks in Treatment: 9 Wound Status Wound Number: 5 Primary Skin Tear Etiology: Wound Location: Right Elbow Wound Status: Open Wounding Event: Skin Tear/Laceration Comorbid Chronic Obstructive Pulmonary Disease (COPD), Date Acquired: 09/10/2023 History: Hypertension Weeks Of Treatment: 1 Clustered Wound: No Photos Wound Measurements Length: (cm) 0.3 Width: (cm) 0.5 Depth: (cm)  0.1 Area: (cm) 0.118 Volume: (cm) 0.012 % Reduction in Area: 70% % Reduction in Volume: 69.2% Epithelialization: Medium (34-66%) Wound Description Classification: Full Thickness Without Exposed Support Exudate Amount: Medium Exudate Type: Serosanguineous Exudate Color: red, brown [name], [name] ([id number]) Wound Bed Granulation Amount: Medium (34-66%) Granulation Quality: Red Necrotic Amount: None Present (0% Structures Foul Odor After Cleansing: No Slough/Fibrino No [id number] Page 10 of 14 Exposed Structure Fascia  Exposed: No ) Fat Layer (Subcutaneous Tissue) Exposed: Yes Tendon Exposed: No Muscle Exposed: No Joint Exposed: No Bone Exposed: No Treatment Notes Wound #5 (Elbow) Wound Laterality: Right Cleanser Soap and Water Discharge Instruction: Gently cleanse wound with antibacterial soap, rinse and pat dry prior to dressing wounds Vashe 5.8 (oz) Discharge Instruction: Use vashe 5.8 (oz) as directed Peri-Wound Care Topical Primary Dressing Xeroform 4x4-HBD (in/in) Discharge Instruction: Apply Xeroform 4x4-HBD (in/in) as directed Secondary Dressing Coverlet Latex-Free Fabric Adhesive Dressings Discharge Instruction: 1.5 x 2 Secured With Compression Wrap Compression Stockings Add-Ons Electronic Signature(s) Signed: 09/29/2023 12:01:12 PM By: Midge Aver MSN RN CNS WTA Entered By: Midge Aver on 09/27/2023 10:02:12 -------------------------------------------------------------------------------- Wound Assessment Details Patient Name: Date of Service: [name] 09/27/2023 12:30 PM Medical Record Number: [id number] Patient Account Number: [id number] Date of Birth/Sex: Treating RN: 27-May-1946 (77 y.o. [name] Primary Care Rainn Bullinger: [name] Other Clinician: Referring Stepan Verrette: Treating Prather Failla/Extender: Nonah Mattes Weeks in Treatment: 9 Wound Status Wound Number: 6 Primary Skin  Tear Etiology: Wound Location: Right, Anterior Lower Leg Wound Status: Healed - Epithelialized Wounding Event: Skin Tear/Laceration Comorbid Chronic Obstructive Pulmonary Disease (COPD), Date Acquired: 09/10/2023 History: Hypertension Weeks Of Treatment: 1 Clustered Wound: No Photos [name], [name] ([id number]) [id number] Page 11 of 14 Wound Measurements Length: (cm) Width: (cm) Depth: (cm) Area: (cm) Volume: (cm) 0 % Reduction in Area: 100% 0 % Reduction in Volume: 100% 0 Epithelialization: Medium (34-66%) 0 0 Wound Description Classification: Full Thickness Without Exposed Suppor Exudate Amount: Medium Exudate Type: Serosanguineous Exudate Color: red, brown t Structures Foul Odor After Cleansing: No Slough/Fibrino No Wound Bed Granulation Amount: Medium (34-66%) Exposed Structure Granulation Quality: Red Fascia Exposed: No Fat Layer (Subcutaneous Tissue) Exposed: Yes Tendon Exposed: No Muscle Exposed: No Joint Exposed: No Bone Exposed: No Treatment Notes Wound #6 (Lower Leg) Wound Laterality: Right, Anterior Cleanser Peri-Wound Care Topical Primary Dressing Secondary Dressing Secured With Compression Wrap Compression Stockings Add-Ons Electronic Signature(s) Signed: 09/29/2023 12:01:12 PM By: Midge Aver MSN RN CNS WTA Entered By: Midge Aver on 09/27/2023 10:15:32 Wound Assessment Details -------------------------------------------------------------------------------- Robin Searing ([id number]) [id number] Page 12 of 14 Patient Name: Date of Service: [name], [name] 09/27/2023 12:30 PM Medical Record Number: [id number] Patient Account Number: [id number] Date of Birth/Sex: Treating RN: [birth year] (77 y.o. [name] Primary Care Coryn Mosso: [name] Other Clinician: Referring Kailen Name: Treating Sarabelle Genson/Extender: Nonah Mattes Weeks in Treatment: 9 Wound  Status Wound Number: 7 Primary Etiology: Pressure Ulcer Wound Location: Left, Lateral Ankle Wound Status: Healed - Epithelialized Wounding Event: Pressure Injury Comorbid Chronic Obstructive Pulmonary Disease (COPD), History: Hypertension Date Acquired: 09/10/2023 Weeks Of Treatment: 1 Clustered Wound: No Photos Wound Measurements Length: (cm) 0 % Reduction in Area: 100% Width: (cm) 0 % Reduction in Volume: 100% Depth: (cm) 0 Area: (cm) 0 Volume: (cm) 0 Wound Description Classification: Category/Stage II Foul Odor After Cleansing: No Exudate Amount: None Present Slough/Fibrino No Wound Bed Granulation Amount: Medium (34-66%) Exposed Structure Granulation Quality: Pale Fascia Exposed: No Necrotic Amount: None Present (0%) Fat Layer (Subcutaneous Tissue) Exposed: No Tendon Exposed: No Muscle Exposed: No Joint Exposed: No Bone Exposed: No Treatment Notes Wound #7 (Ankle) Wound Laterality: Left, Lateral Cleanser Peri-Wound Care Topical Primary Dressing Secondary Dressing Secured With Compression Wrap Compression Stockings Add-Ons Electronic Signature(s) Signed: 09/29/2023 12:01:12 PM By: Midge Aver MSN RN CNS WTA Entered By: Midge Aver on 09/27/2023 10:15:38 Robin Searing ([id number]) [id number] Page 13 of 14 -------------------------------------------------------------------------------- Wound Assessment Details Patient  Name: Date of Service: [name], RAFFERTY 09/27/2023 12:30 PM Medical Record Number: [id number] Patient Account Number: [id number] Date of Birth/Sex: Treating RN: 05/12/1946 (77 y.o. [name] Primary Care Katora Fini: [name] Other Clinician: Referring Ayen Viviano: Treating Bekka Qian/Extender: Nonah Mattes Weeks in Treatment: 9 Wound Status Wound Number: 8 Primary Etiology: Skin Tear Wound Location: Left, Anterior Lower Leg Wound Status: Open Wounding Event: Gradually Appeared  Comorbid Chronic Obstructive Pulmonary Disease (COPD), History: Hypertension Date Acquired: 09/21/2023 Weeks Of Treatment: 0 Clustered Wound: Yes Photos Wound Measurements Length: (cm) 3 Width: (cm) 1.7 Depth: (cm) 0.2 Area: (cm) 4.006 Volume: (cm) 0.801 % Reduction in Area: % Reduction in Volume: Epithelialization: None Tunneling: No Undermining: No Wound Description Classification: Full Thickness Without Exposed Suppor Exudate Amount: Medium Exudate Type: Serosanguineous Exudate Color: red, brown t Structures Foul Odor After Cleansing: No Slough/Fibrino No Wound Bed Granulation Amount: Medium (34-66%) Exposed Structure Granulation Quality: Red, Pink Fascia Exposed: No Necrotic Amount: None Present (0%) Fat Layer (Subcutaneous Tissue) Exposed: Yes Tendon Exposed: No Muscle Exposed: No Joint Exposed: No Bone Exposed: No Treatment Notes Wound #8 (Lower Leg) Wound Laterality: Left, Anterior Cleanser Soap and Water Discharge Instruction: Gently cleanse wound with antibacterial soap, rinse and pat dry prior to dressing wounds MARNIE, PLOTT ([id number]) [id number] Page 14 of 14 Vashe 5.8 (oz) Discharge Instruction: Use vashe 5.8 (oz) as directed Peri-Wound Care Topical Primary Dressing Xeroform 4x4-HBD (in/in) Discharge Instruction: Apply Xeroform 4x4-HBD (in/in) as directed Secondary Dressing Coverlet Latex-Free Fabric Adhesive Dressings Discharge Instruction: 1.5 x 2 Secured With Compression Wrap Compression Stockings Add-Ons Electronic Signature(s) Signed: 09/29/2023 12:01:12 PM By: Midge Aver MSN RN CNS WTA Entered By: Midge Aver on 09/27/2023 10:13:33 -------------------------------------------------------------------------------- Vitals Details Patient Name: Date of Service: [name] 09/27/2023 12:30 PM Medical Record Number: [id number] Patient Account Number: [id number] Date of Birth/Sex: Treating  RN: January 13, 1946 (77 y.o. [name] Primary Care Ursala Cressy: [name] Other Clinician: Referring Adaly Puder: Treating Aikeem Lilley/Extender: Nonah Mattes Weeks in Treatment: 9 Vital Signs Time Taken: 12:49 Temperature (F): 97.7 Height (in): 63 Pulse (bpm): 90 Weight (lbs): 141.8 Respiratory Rate (breaths/min): 18 Body Mass Index (BMI): 25.1 Blood Pressure (mmHg): 115/69 Reference Range: 80 - 120 mg / dl Electronic Signature(s) Signed: 09/29/2023 12:01:12 PM By: Midge Aver MSN RN CNS WTA Entered By: Midge Aver on 09/27/2023 09:51:57

## 2023-10-03 ENCOUNTER — Ambulatory Visit: Payer: Medicare Other | Admitting: Physical Therapy

## 2023-10-05 ENCOUNTER — Encounter: Payer: Medicare Other | Admitting: Cardiovascular Disease

## 2023-10-05 ENCOUNTER — Ambulatory Visit: Payer: Medicare Other | Admitting: Physical Therapy

## 2023-10-10 ENCOUNTER — Ambulatory Visit: Payer: Medicare Other | Admitting: Physical Therapy

## 2023-10-12 ENCOUNTER — Encounter: Payer: Self-pay | Admitting: Gastroenterology

## 2023-10-12 ENCOUNTER — Encounter: Payer: Medicare Other | Attending: Physician Assistant | Admitting: Physician Assistant

## 2023-10-12 ENCOUNTER — Ambulatory Visit: Payer: Medicare Other | Admitting: Physical Therapy

## 2023-10-12 DIAGNOSIS — Z950 Presence of cardiac pacemaker: Secondary | ICD-10-CM | POA: Diagnosis not present

## 2023-10-12 DIAGNOSIS — J449 Chronic obstructive pulmonary disease, unspecified: Secondary | ICD-10-CM | POA: Insufficient documentation

## 2023-10-12 DIAGNOSIS — Z7901 Long term (current) use of anticoagulants: Secondary | ICD-10-CM | POA: Insufficient documentation

## 2023-10-12 DIAGNOSIS — L97821 Non-pressure chronic ulcer of other part of left lower leg limited to breakdown of skin: Secondary | ICD-10-CM | POA: Diagnosis not present

## 2023-10-12 DIAGNOSIS — I872 Venous insufficiency (chronic) (peripheral): Secondary | ICD-10-CM | POA: Insufficient documentation

## 2023-10-12 DIAGNOSIS — I10 Essential (primary) hypertension: Secondary | ICD-10-CM | POA: Diagnosis not present

## 2023-10-12 DIAGNOSIS — S40811A Abrasion of right upper arm, initial encounter: Secondary | ICD-10-CM | POA: Insufficient documentation

## 2023-10-12 DIAGNOSIS — X58XXXA Exposure to other specified factors, initial encounter: Secondary | ICD-10-CM | POA: Diagnosis not present

## 2023-10-12 DIAGNOSIS — L97328 Non-pressure chronic ulcer of left ankle with other specified severity: Secondary | ICD-10-CM | POA: Insufficient documentation

## 2023-10-12 DIAGNOSIS — I87333 Chronic venous hypertension (idiopathic) with ulcer and inflammation of bilateral lower extremity: Secondary | ICD-10-CM | POA: Insufficient documentation

## 2023-10-12 DIAGNOSIS — L97828 Non-pressure chronic ulcer of other part of left lower leg with other specified severity: Secondary | ICD-10-CM | POA: Diagnosis not present

## 2023-10-12 DIAGNOSIS — L97818 Non-pressure chronic ulcer of other part of right lower leg with other specified severity: Secondary | ICD-10-CM | POA: Insufficient documentation

## 2023-10-12 NOTE — Progress Notes (Addendum)
 [name] ([id number]) [id number] Page 1 of 6 Visit Report for 10/12/2023 Chief Complaint Document Details Patient Name: Date of Service: [name], [name] 10/12/2023 12:15 PM Medical Record Number: [id number] Patient Account Number: [id number] Date of Birth/Sex: Treating RN: 1945-10-24 (78 y.o. [name] Primary Care Provider: Rilla [name] Other Clinician: Referring Provider: Treating Provider/Extender: [name] [name] Weeks in Treatment: 11 Information Obtained from: Patient Chief Complaint 07/26/2023; new patient visit for wounds on her bilateral lower legs and a more recent abrasion to her right forearm after a fall Electronic Signature(s) Signed: 10/12/2023 12:39:37 PM By: [name] Entered By: [name] on 10/12/2023 09:39:37 -------------------------------------------------------------------------------- HPI Details Patient Name: Date of Service: [name] 10/12/2023 12:15 PM Medical Record Number: [id number] Patient Account Number: [id number] Date of Birth/Sex: Treating RN: 10-19-1945 (78 y.o. [name] Primary Care Provider: Rilla [name] Other Clinician: Referring Provider: Treating Provider/Extender: [name] [name] Weeks in Treatment: 11 History of Present Illness HPI Description: 07/26/2023 ADMISSION This is a 78 year old woman who has been referred for wounds on her bilateral lower legs by her primary physician. She was last seen in primary care on 06/15/2023 at which time she had an abrasion injury to the left anterior shin right medial leg and some scabbing over the left lateral malleolus. She also had bilateral heel issues although they were not open today. A culture of 1 of these wounds showed Pseudomonas I am not exactly sure what antibiotic she received. She had arterial studies done on 07/13/2023 that showed an ABI on the right of 1.25 TBI 1.08 with biphasic and  triphasic waveforms. On the left ABI I was 1.29 TBI at 0.92 and triphasic waveforms. All of this suggests that her arterial flow was quite good. The patient states that she has on and off wounds in her bilateral lower extremities for at least a year. The areas will heal and then reopen. She has been prescribed compression stockings in the past but she cannot get them on. She lives independently. A week ago she had a fall at home causing a skin tear on the right dorsal arm. 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 Her wounds are a lot better with simple TEREASA, YILMAZ ([id number]) [id number] Page 2 of 6 compression of an Urgo K2 light. I think she is going to require ongoing compression stockings. She reminds me that she cannot get standard over the toe stockings on. We will look at external compression stockings The area on the right anterior arm we use Xeroform to last week. This apparently stuck to the wound she seemed particularly cross about this. She wants to continue to use triamcinolone  and T on it. I did not argue this point elfa 10/30; patient with severe chronic venous insufficiency in her bilateral lower legs. Her wounds have essentially close she does have some eschared areas but nothing is open. She has a single JuxtaLite stocking but we have prior authorization for a second juxta lite stocking. She cannot get standard stockings on her legs. She also had an abrasion on the right arm. This is still open. She has been using triamcinolone  on this with a nonstick pad. We tried Vaseline gauze, Xeroform but according to the patient and the stuck to the wound bed. I wonder if the triamcinolone  is actually inhibiting healing of this at this point.  09-18-2023 upon evaluation  today patient appears to be doing well currently in regard to her wounds. She has been tolerating the dressing changes without complication. 09-27-2023 upon evaluation today patient appears to be doing well currently in regard to her wound. She has been tolerating the dressing changes all wounds appear to be pretty much about healed except for her skin tear on the elbow which is all but healed. She also notes that the left anterior shin is new this is a wound that actually occurred as a result of her put her stockings on. She had a small skin tear over the fingernail because of this. It does not appear to be too deep this can require little bit of trimming of the skin. 10-12-23 upon evaluation today patient appears to be doing excellent in fact she appears to be completely healed. Fortunately I do not see any signs of active infection at this time which is great news and in general I do believe that making good headway here towards closure. Electronic Signature(s) Signed: 10/12/2023 12:54:36 PM By: [name] Entered By: [name] on 10/12/2023 09:54:36 -------------------------------------------------------------------------------- Physical Exam Details Patient Name: Date of Service: [name], [name] 10/12/2023 12:15 PM Medical Record Number: [id number] Patient Account Number: [id number] Date of Birth/Sex: Treating RN: 1946-06-04 (78 y.o. [name] Primary Care Provider: Rilla [name] Other Clinician: Referring Provider: Treating Provider/Extender: [name] [name] Weeks in Treatment: 11 Constitutional Well-nourished and well-hydrated in no acute distress. Respiratory normal breathing without difficulty. Psychiatric this patient is able to make decisions and demonstrates good insight into disease process. Alert and Oriented x 3. pleasant and cooperative. Notes Upon inspection patient's wound bed actually showed signs of good granulation epithelization at this  point. I do not see any signs of opening and I feel like in general that she is really doing quite well both in regard to the elbow/arm as well as her left leg. Electronic Signature(s) Signed: 10/12/2023 12:54:51 PM By: [name] Entered By: [name] on 10/12/2023 09:54:50 [name] ([id number]) [id number] Page 3 of 6 -------------------------------------------------------------------------------- Physician Orders Details Patient Name: Date of Service: [name], [name] 10/12/2023 12:15 PM Medical Record Number: [id number] Patient Account Number: [id number] Date of Birth/Sex: Treating RN: 02/01/1946 (78 y.o. [name] Primary Care Provider: Rilla [name] Other Clinician: Referring Provider: Treating Provider/Extender: [name] [name] [name] in Treatment: 11 The following information was scribed by: Claudene [name] The information was scribed for: [name] Verbal / Phone Orders: No Diagnosis Coding ICD-10 Coding Code Description [phone] Chronic venous hypertension (idiopathic) with ulcer and inflammation of bilateral lower extremity L97.821 Non-pressure chronic ulcer of other part of left lower leg limited to breakdown of skin L97.818 Non-pressure chronic ulcer of other part of right lower leg with other specified severity L97.328 Non-pressure chronic ulcer of left ankle with other specified severity S40.811D Abrasion of right upper arm, subsequent encounter Discharge From [facility] Discharge from Wound Care Center Treatment Complete Follow-up Appointments Other: - call if needed Electronic Signature(s) Signed: 10/12/2023 5:05:37 PM By: [name] MSN RN CNS WTA Signed: 10/12/2023 5:07:17 PM By: [name] Entered By: Claudene [name] on 10/12/2023 09:54:14 -------------------------------------------------------------------------------- Problem List Details Patient Name: Date of Service: [name]  10/12/2023 12:15 PM Medical Record Number: [id number] Patient Account Number: [id number] Date of Birth/Sex: Treating RN: [birth year] (78 y.o. [name] Primary Care Provider: Rilla [name] Other Clinician: Referring Provider: Treating Provider/Extender: [name] [name] Weeks in  Treatment: 11 Active Problems ICD-10 Encounter Code Description Active Date MDM Diagnosis [name], [name] ([id number]) [id number] Page 4 of 6 [phone] Chronic venous hypertension (idiopathic) with ulcer and inflammation of 07/26/2023 No Yes bilateral lower extremity L97.821 Non-pressure chronic ulcer of other part of left lower leg limited to breakdown 07/26/2023 No Yes of skin L97.818 Non-pressure chronic ulcer of other part of right lower leg with other specified 07/26/2023 No Yes severity L97.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 of right upper arm, subsequent encounter 07/26/2023 No Yes Inactive Problems Resolved Problems Electronic Signature(s) Signed: 10/12/2023 12:39:33 PM By: [name] Entered By: [name] on 10/12/2023 09:39:33 -------------------------------------------------------------------------------- Progress Note Details Patient Name: Date of Service: [name] 10/12/2023 12:15 PM Medical Record Number: [id number] Patient Account Number: [id number] Date of Birth/Sex: Treating RN: 10/01/1946 (78 y.o. [name] Primary Care Provider: Rilla [name] Other Clinician: Referring Provider: Treating Provider/Extender: [name] [name] [name] in Treatment: 11 Subjective Chief Complaint Information obtained from Patient 07/26/2023; new patient visit for wounds on her bilateral lower legs and a more recent abrasion to her right forearm after a fall History of Present Illness (HPI) 07/26/2023 ADMISSION This is a 78 year old woman who has  been referred for wounds on her bilateral lower legs by her primary physician. She was last seen in primary care on 06/15/2023 at which time she had an abrasion injury to the left anterior shin right medial leg and some scabbing over the left lateral malleolus. She also had bilateral heel issues although they were not open today. A culture of 1 of these wounds showed Pseudomonas I am not exactly sure what antibiotic she received. She had arterial studies done on 07/13/2023 that showed an ABI on the right of 1.25 TBI 1.08 with biphasic and triphasic waveforms. On the left ABI I was 1.29 TBI at 0.92 and triphasic waveforms. All of this suggests that her arterial flow was quite good. The patient states that she has on and off wounds in her bilateral lower extremities for at least a year. The areas will heal and then reopen. She has been prescribed compression stockings in the past but she cannot get them on. She lives independently. A week ago she had a fall at home causing a skin tear on the right dorsal arm. 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 Her wounds are a lot better with simple compression of an Urgo K2 light. I think she is going to require ongoing compression stockings. She reminds me that she cannot get standard over the toe stockings on. We will look at external compression stockings The area on the right anterior arm we use Xeroform to last week. This apparently stuck to the wound she seemed particularly cross about this. She wants to continue to use triamcinolone  and T on it. I did not argue this point elfa 10/30; patient with severe chronic venous insufficiency in her bilateral lower legs. Her wounds have essentially  close she does have some eschared areas but nothing is open. She has a single JuxtaLite stocking but we have prior authorization for a second juxta lite stocking. She cannot get standard stockings on her legs. She also had an abrasion on the right arm. This is still open. She has been using  triamcinolone  on this with a nonstick pad. We tried Vaseline gauze, Xeroform but according to the patient and the stuck to the wound bed. I wonder if the triamcinolone  is actually inhibiting healing of this at this point. 09-18-2023 upon evaluation today patient appears to be doing well currently in regard to her wounds. She has been tolerating the dressing changes without complication. 09-27-2023 upon evaluation today patient appears to be doing well currently in regard to her wound. She has been tolerating the dressing changes all wounds appear to be pretty much about healed except for her skin tear on the elbow which is all but healed. She also notes that the left anterior shin is new this is a wound that actually occurred as a result of her put her stockings on. She had a small skin tear over the fingernail because of this. It does not appear to be too deep this can require little bit of trimming of the skin. 10-12-23 upon evaluation today patient appears to be doing excellent in fact she appears to be completely healed. Fortunately I do not see any signs of active infection at this time which is great news and in general I do believe that making good headway here towards closure. Objective Constitutional Well-nourished and well-hydrated in no acute distress. Vitals Time Taken: 12:26 PM, Height: 63 in, Weight: 141.8 lbs, BMI: 25.1, Temperature: 97.5 F, Pulse: 86 bpm, Respiratory Rate: 18 breaths/min, Blood Pressure: 110/68 mmHg. Respiratory normal breathing without difficulty. Psychiatric this patient is able to make decisions and demonstrates good insight into disease process. Alert and Oriented x 3.  pleasant and cooperative. General Notes: Upon inspection patient's wound bed actually showed signs of good granulation epithelization at this point. I do not see any signs of opening and I feel like in general that she is really doing quite well both in regard to the elbow/arm as well as her left leg. Integumentary (Hair, Skin) Wound #5 status is Healed - Epithelialized. Original cause of wound was Skin Tear/Laceration. The date acquired was: 09/10/2023. The wound has been in treatment 3 weeks. The wound is located on the Right Elbow. The wound measures 0cm length x 0cm width x 0cm depth; 0cm^2 area and 0cm^3 volume. There is Fat Layer (Subcutaneous Tissue) exposed. There is a medium amount of serosanguineous drainage noted. There is medium (34-66%) red granulation within the wound bed. There is no necrotic tissue within the wound bed. Wound #8 status is Healed - Epithelialized. Original cause of wound was Gradually Appeared. The date acquired was: 09/21/2023. The wound has been in treatment 2 weeks. The wound is located on the Left,Anterior Lower Leg. The wound measures 0cm length x 0cm width x 0cm depth; 0cm^2 area and 0cm^3 volume. There is Fat Layer (Subcutaneous Tissue) exposed. There is a medium amount of serosanguineous drainage noted. There is medium (34-66%) red, pink granulation within the wound bed. There is no necrotic tissue within the wound bed. Assessment Active Problems ICD-10 Chronic venous hypertension (idiopathic) with ulcer and inflammation of bilateral lower extremity Non-pressure chronic ulcer of other part of left lower leg limited to breakdown of skin Non-pressure chronic ulcer of other part of right lower leg with other specified severity Non-pressure chronic ulcer of left ankle with other specified severity Abrasion of right upper arm, subsequent encounter Plan Discharge From [facility] Services: Discharge from Wound Care Center Treatment Complete Follow-up  Appointments: Other: - call if needed [name], [name] ([id number]) [phone].pdf Page 6 of 6 1. I would recommend that  the patient should continue to monitor for any signs of worsening overall. Based on what I am seeing I do believe that she is completely healed and this appears to be doing quite well. 2. I am going to recommend as well that the patient should continue to utilize the protective dressing such as just an ABD pad and stretch net to keep her from bumping the elbow as she tells me she has been bumping this a lot as it stands anyway. We will see patient back for reevaluation in 1 week here in the clinic. If anything worsens or changes patient will contact our office for additional recommendations. Electronic Signature(s) Signed: 10/12/2023 12:55:36 PM By: [name] Entered By: [name] on 10/12/2023 09:55:35 -------------------------------------------------------------------------------- SuperBill Details Patient Name: Date of Service: [name] 10/12/2023 Medical Record Number: [id number] Patient Account Number: [id number] Date of Birth/Sex: Treating RN: 12/18/1945 (78 y.o. [name] Primary Care Provider: Rilla [name] Other Clinician: Referring Provider: Treating Provider/Extender: [name] [name] Weeks in Treatment: 11 Diagnosis Coding ICD-10 Codes Code Description [phone] Chronic venous hypertension (idiopathic) with ulcer and inflammation of bilateral lower extremity L97.821 Non-pressure chronic ulcer of other part of left lower leg limited to breakdown of skin L97.818 Non-pressure chronic ulcer of other part of right lower leg with other specified severity L97.328 Non-pressure chronic ulcer of left ankle with other specified severity S40.811D Abrasion of right upper arm, subsequent encounter Facility Procedures : CPT4 Code: [id number] Description: 99213 - WOUND CARE VISIT-LEV 3 EST  PT Modifier: Quantity: 1 Physician Procedures : CPT4 Code Description Modifier [id number] - WC PHYS LEVEL 3 - EST PT ICD-10 Diagnosis Description I87.333 Chronic venous hypertension (idiopathic) with ulcer and inflammation of bilateral lower extremity L97.821 Non-pressure chronic ulcer of other  part of left lower leg limited to breakdown of skin L97.818 Non-pressure chronic ulcer of other part of right lower leg with other specified severity L97.328 Non-pressure chronic ulcer of left ankle with other specified severity Quantity: 1 Electronic Signature(s) Signed: 10/12/2023 1:11:41 PM By: [name] Previous Signature: 10/12/2023 12:55:47 PM Version By: [name] Entered By: [name] on 10/12/2023 10:11:41

## 2023-10-13 NOTE — Progress Notes (Signed)
 Diane, [name] ([id number]) [id number] Page 1 of 9 Visit Report for 10/12/2023 Arrival Information Details Patient Name: Date of Service: [name], [name] 10/12/2023 12:15 PM Medical Record Number: [id number] Patient Account Number: [id number] Date of Birth/Sex: Treating RN: 1946/06/09 (78 y.o. [name] Primary Care Cloyd Ragas: [name] Other Clinician: Referring Erica Richwine: Treating Neely Cecena/Extender: Bethena Andre [name] Devra in Treatment: 11 Visit Information History Since Last Visit Added or deleted any medications: No Patient Arrived: [place] Any new allergies or adverse reactions: No Arrival Time: 12:21 Had a fall or experienced change in No Accompanied By: self activities of daily living that may affect Transfer Assistance: None risk of falls: Patient Requires Transmission-Based Precautions: No Signs or symptoms of abuse/neglect since last visito No Patient Has Alerts: Yes Hospitalized since last visit: No Patient Alerts: Patient on Blood Thinner Has Dressing in Place as Prescribed: Yes eliquis  Pain Present Now: No R ABI 1.25 TBI 1.08 L ABI 1.29 TBI 0.92 PACEMAKER Electronic Signature(s) Signed: 10/12/2023 5:05:37 PM By: [name] MSN RN CNS WTA Entered By: Claudene [name] on 10/12/2023 09:24:50 -------------------------------------------------------------------------------- Clinic Level of Care Assessment Details Patient Name: Date of Service: Diane, [name] 10/12/2023 12:15 PM Medical Record Number: [id number] Patient Account Number: [id number] Date of Birth/Sex: Treating RN: 1946-04-10 (78 y.o. [name] Primary Care Wendelin Bradt: [name] Other Clinician: Referring Lyndsey Demos: Treating Leotha Westermeyer/Extender: Bethena Andre [name] Weeks in Treatment: 11 Clinic Level of Care Assessment Items TOOL 4 Quantity Score X- 1 0 Use when only an EandM is performed on FOLLOW-UP visit ASSESSMENTS - Nursing  Assessment / Reassessment X- 1 10 Reassessment of Co-morbidities (includes updates in patient status) X- 1 5 Reassessment of Adherence to Treatment Plan ASSESSMENTS - Wound and Skin A ssessment / Reassessment []  - 0 Simple Wound Assessment / Reassessment - one wound VIRJEAN, BOMAN ([id number]) [id number] Page 2 of 9 X- 2 5 Complex Wound Assessment / Reassessment - multiple wounds []  - 0 Dermatologic / Skin Assessment (not related to wound area) ASSESSMENTS - Focused Assessment []  - 0 Circumferential Edema Measurements - multi extremities []  - 0 Nutritional Assessment / Counseling / Intervention []  - 0 Lower Extremity Assessment (monofilament, tuning fork, pulses) []  - 0 Peripheral Arterial Disease Assessment (using hand held doppler) ASSESSMENTS - Ostomy and/or Continence Assessment and Care []  - 0 Incontinence Assessment and Management []  - 0 Ostomy Care Assessment and Management (repouching, etc.) PROCESS - Coordination of Care X - Simple Patient / Family Education for ongoing care 1 15 []  - 0 Complex (extensive) Patient / Family Education for ongoing care X- 1 10 Staff obtains Chiropractor, Records, T Results / Process Orders est []  - 0 Staff telephones HHA, Nursing Homes / Clarify orders / etc []  - 0 Routine Transfer to another Facility (non-emergent condition) []  - 0 Routine Hospital Admission (non-emergent condition) []  - 0 New Admissions / Manufacturing Engineer / Ordering NPWT Apligraf, etc. , []  - 0 Emergency Hospital Admission (emergent condition) X- 1 10 Simple Discharge Coordination []  - 0 Complex (extensive) Discharge Coordination PROCESS - Special Needs []  - 0 Pediatric / Minor Patient Management []  - 0 Isolation Patient Management []  - 0 Hearing / Language / Visual special needs []  - 0 Assessment of Community assistance (transportation, D/C planning, etc.) []  - 0 Additional assistance / Altered mentation []  -  0 Support Surface(s) Assessment (bed, cushion, seat, etc.) INTERVENTIONS - Wound Cleansing / Measurement []  - 0 Simple Wound Cleansing - one wound X- 2 5 Complex Wound  Cleansing - multiple wounds X- 1 5 Wound Imaging (photographs - any number of wounds) []  - 0 Wound Tracing (instead of photographs) []  - 0 Simple Wound Measurement - one wound X- 2 5 Complex Wound Measurement - multiple wounds INTERVENTIONS - Wound Dressings []  - 0 Small Wound Dressing one or multiple wounds []  - 0 Medium Wound Dressing one or multiple wounds []  - 0 Large Wound Dressing one or multiple wounds []  - 0 Application of Medications - topical []  - 0 Application of Medications - injection INTERVENTIONS - Miscellaneous []  - 0 External ear exam []  - 0 Specimen Collection (cultures, biopsies, blood, body fluids, etc.) MILIA, WARTH ([id number]) [id number] Page 3 of 9 []  - 0 Specimen(s) / Culture(s) sent or taken to Lab for analysis []  - 0 Patient Transfer (multiple staff / Deitra Lift / Similar devices) []  - 0 Simple Staple / Suture removal (25 or less) []  - 0 Complex Staple / Suture removal (26 or more) []  - 0 Hypo / Hyperglycemic Management (close monitor of Blood Glucose) []  - 0 Ankle / Brachial Index (ABI) - do not check if billed separately X- 1 5 Vital Signs Has the patient been seen at the hospital within the last three years: Yes Total Score: 90 Level Of Care: New/Established - Level 3 Electronic Signature(s) Signed: 10/12/2023 5:05:37 PM By: [name] MSN RN CNS WTA Entered By: Claudene [name] on 10/12/2023 09:56:41 -------------------------------------------------------------------------------- Encounter Discharge Information Details Patient Name: Date of Service: [name] 10/12/2023 12:15 PM Medical Record Number: [id number] Patient Account Number: [id number] Date of Birth/Sex: Treating RN: 07-11-1946 (78 y.o. [name] Primary Care  Blaine Hari: [name] Other Clinician: Referring Darice Vicario: Treating Zayd Bonet/Extender: Bethena Andre [name] Devra in Treatment: 11 Encounter Discharge Information Items Discharge Condition: Stable Ambulatory Status: Cane Discharge Destination: Home Transportation: Private Auto Accompanied By: self Schedule Follow-up Appointment: No Clinical Summary of Care: Electronic Signature(s) Signed: 10/12/2023 5:05:37 PM By: [name] MSN RN CNS WTA Entered By: Claudene [name] on 10/12/2023 09:57:52 -------------------------------------------------------------------------------- Lower Extremity Assessment Details Patient Name: Date of Service: [name] 10/12/2023 12:15 PM Medical Record Number: [id number] Patient Account Number: [id number] Date of Birth/Sex: Treating RN: 07-25-1946 (78 y.o. Jennifermarie, Franzen, Urich ([id number]) [id number] Page 4 of 9 Primary Care Kyson Kupper: [name] Other Clinician: Referring Allon Costlow: Treating Chanse Kagel/Extender: Bethena Andre [name] Devra in Treatment: 11 Electronic Signature(s) Signed: 10/12/2023 5:05:37 PM By: [name] MSN RN CNS WTA Entered By: Claudene [name] on 10/12/2023 09:33:24 -------------------------------------------------------------------------------- Multi Wound Chart Details Patient Name: Date of Service: [name] 10/12/2023 12:15 PM Medical Record Number: [id number] Patient Account Number: [id number] Date of Birth/Sex: Treating RN: 1946/04/20 (78 y.o. [name] Primary Care Alyx Mcguirk: [name] Other Clinician: Referring Halea Lieb: Treating Janeese Mcgloin/Extender: Bethena Andre [name] Weeks in Treatment: 11 Vital Signs Height(in): 63 Pulse(bpm): 86 Weight(lbs): 141.8 Blood Pressure(mmHg): 110/68 Body Mass Index(BMI): 25.1 Temperature(F): 97.5 Respiratory Rate(breaths/min): 18 [5:Photos:] [N/A:N/A] Right Elbow Left, Anterior Lower  Leg N/A Wound Location: Skin T ear/Laceration Gradually Appeared N/A Wounding Event: Skin T ear Skin T ear N/A Primary Etiology: Chronic Obstructive Pulmonary Chronic Obstructive Pulmonary N/A Comorbid History: Disease (COPD), Hypertension Disease (COPD), Hypertension 09/10/2023 09/21/2023 N/A Date Acquired: 3 2 N/A Weeks of Treatment: Healed - Epithelialized Healed - Epithelialized N/A Wound Status: No No N/A Wound Recurrence: No Yes N/A Clustered Wound: 0x0x0 0x0x0 N/A Measurements L x W x D (cm) 0 0 N/A A (cm) : rea 0 0 N/A  Volume (cm) : 100.00% 100.00% N/A % Reduction in Area: 100.00% 100.00% N/A % Reduction in Volume: Full Thickness Without Exposed Full Thickness Without Exposed N/A Classification: Support Structures Support Structures Medium Medium N/A Exudate Amount: Serosanguineous Serosanguineous N/A Exudate Type: red, brown red, brown N/A Exudate Color: Medium (34-66%) Medium (34-66%) N/A Granulation Amount: Red Red, Pink N/A Granulation Quality: None Present (0%) None Present (0%) N/A Necrotic Amount: Fat Layer (Subcutaneous Tissue): Yes Fat Layer (Subcutaneous Tissue): Yes N/A Exposed Structures: Fascia: No Fascia: No Tendon: No Tendon: No Muscle: No Muscle: No Joint: No Joint: No ILIANNA, BOWN ([id number]) [id number] Page 5 of 9 Bone: No Bone: No Medium (34-66%) None N/A Epithelialization: Treatment Notes Electronic Signature(s) Signed: 10/12/2023 5:05:37 PM By: [name] MSN RN CNS WTA Entered By: Claudene [name] on 10/12/2023 09:53:35 -------------------------------------------------------------------------------- Multi-Disciplinary Care Plan Details Patient Name: Date of Service: [name] 10/12/2023 12:15 PM Medical Record Number: [id number] Patient Account Number: [id number] Date of Birth/Sex: Treating RN: [birth year] (78 y.o. [name] Primary Care Tomothy Eddins: [name] Other  Clinician: Referring Starkeisha Vanwinkle: Treating Kalenna Millett/Extender: Bethena Andre [name] Weeks in Treatment: 11 Active Inactive Electronic Signature(s) Signed: 10/12/2023 5:05:37 PM By: [name] MSN RN CNS WTA Entered By: Claudene [name] on 10/12/2023 09:56:58 -------------------------------------------------------------------------------- Pain Assessment Details Patient Name: Date of Service: [name] 10/12/2023 12:15 PM Medical Record Number: [id number] Patient Account Number: [id number] Date of Birth/Sex: Treating RN: Dec 20, 1945 (78 y.o. [name] Primary Care Vernia Teem: [name] Other Clinician: Referring Jaycen Vercher: Treating Quindarius Cabello/Extender: Bethena Andre [name] Weeks in Treatment: 11 Active Problems Location of Pain Severity and Description of Pain Patient Has Paino No Site Locations [place], [place] ([id number]) [id number] Page 6 of 9 Pain Management and Medication Current Pain Management: Electronic Signature(s) Signed: 10/12/2023 5:05:37 PM By: [name] MSN RN CNS WTA Entered By: Claudene [name] on 10/12/2023 09:28:34 -------------------------------------------------------------------------------- Patient/Caregiver Education Details Patient Name: Date of Service: [name] 1/2/2025andnbsp12:15 PM Medical Record Number: [id number] Patient Account Number: [id number] Date of Birth/Gender: Treating RN: [birth year] (78 y.o. [name] Primary Care Physician: [name] Other Clinician: Referring Physician: Treating Physician/Extender: Bethena Andre [name] Devra in Treatment: 11 Education Assessment Education Provided To: Patient Education Topics Provided Discharge Packet: Methods: Explain/Verbal Responses: State content correctly [employer]) Signed: 10/12/2023 5:05:37 PM By: [name] MSN RN CNS WTA Entered By: Claudene [name] on 10/12/2023 09:57:20 LACINDA DIAMOND RAMAN  ([id number]) [id number] Page 7 of 9 -------------------------------------------------------------------------------- Wound Assessment Details Patient Name: Date of Service: COLANDRA, OHANIAN 10/12/2023 12:15 PM Medical Record Number: [id number] Patient Account Number: [id number] Date of Birth/Sex: Treating RN: November 26, 1945 (78 y.o. [name] Primary Care Arna Luis: [name] Other Clinician: Referring Seaton Hofmann: Treating Dartha Rozzell/Extender: Bethena Andre [name] Weeks in Treatment: 11 Wound Status Wound Number: 5 Primary Skin Tear Etiology: Wound Location: Right Elbow Wound Status: Healed - Epithelialized Wounding Event: Skin Tear/Laceration Comorbid Chronic Obstructive Pulmonary Disease (COPD), Date Acquired: 09/10/2023 History: Hypertension Weeks Of Treatment: 3 Clustered Wound: No Photos Wound Measurements Length: (cm) Width: (cm) Depth: (cm) Area: (cm) Volume: (cm) 0 % Reduction in Area: 100% 0 % Reduction in Volume: 100% 0 Epithelialization: Medium (34-66%) 0 0 Wound Description Classification: Full Thickness Without Exposed Suppor Exudate Amount: Medium Exudate Type: Serosanguineous Exudate Color: red, brown t Structures Foul Odor After Cleansing: No Slough/Fibrino No Wound Bed Granulation Amount: Medium (34-66%) Exposed Structure Granulation Quality: Red Fascia Exposed: No Necrotic Amount: None Present (0%) Fat Layer (Subcutaneous Tissue) Exposed: Yes  Tendon Exposed: No Muscle Exposed: No Joint Exposed: No Bone Exposed: No Treatment Notes Wound #5 (Elbow) Wound Laterality: Right Cleanser Peri-Wound Care Topical GRATIA, DISLA ([id number]) [id number] Page 8 of 9 Primary Dressing Secondary Dressing Secured With Compression Wrap Compression Stockings Add-Ons Electronic Signature(s) Signed: 10/12/2023 5:05:37 PM By: [name] MSN RN CNS WTA Entered By: Claudene [name] on 10/12/2023  09:47:26 -------------------------------------------------------------------------------- Wound Assessment Details Patient Name: Date of Service: [name] 10/12/2023 12:15 PM Medical Record Number: [id number] Patient Account Number: [id number] Date of Birth/Sex: Treating RN: Sep 12, 1946 (78 y.o. [name] Primary Care Tylan Briguglio: [name] Other Clinician: Referring Taron Conrey: Treating Kendre Sires/Extender: Bethena Andre [name] Weeks in Treatment: 11 Wound Status Wound Number: 8 Primary Etiology: Skin Tear Wound Location: Left, Anterior Lower Leg Wound Status: Healed - Epithelialized Wounding Event: Gradually Appeared Comorbid Chronic Obstructive Pulmonary Disease (COPD), History: Hypertension Date Acquired: 09/21/2023 Weeks Of Treatment: 2 Clustered Wound: Yes Photos Wound Measurements Length: (cm) Width: (cm) Depth: (cm) Area: (cm) Volume: (cm) 0 % Reduction in Area: 100% 0 % Reduction in Volume: 100% 0 Epithelialization: None 0 0 Wound Description Classification: Full Thickness Without Exposed Support Exudate Amount: Medium Exudate Type: Serosanguineous Exudate Color: red, brown Structures Foul Odor After Cleansing: No Slough/Fibrino No Wound Bed Granulation Amount: Medium (34-66%) [address] MAGHAN, JESSEE ([id number]) [id number] Page 9 of 9 Granulation Quality: Red, Pink Fascia Exposed: No Necrotic Amount: None Present (0%) Fat Layer (Subcutaneous Tissue) Exposed: Yes Tendon Exposed: No Muscle Exposed: No Joint Exposed: No Bone Exposed: No Treatment Notes Wound #8 (Lower Leg) Wound Laterality: Left, Anterior Cleanser Peri-Wound Care Topical Primary Dressing Secondary Dressing Secured With Compression Wrap Compression Stockings Add-Ons Electronic Signature(s) Signed: 10/12/2023 5:05:37 PM By: [name] MSN RN CNS WTA Entered By: Claudene [name] on 10/12/2023  09:47:35 -------------------------------------------------------------------------------- Vitals Details Patient Name: Date of Service: [name] 10/12/2023 12:15 PM Medical Record Number: [id number] Patient Account Number: [id number] Date of Birth/Sex: Treating RN: [birth year] (78 y.o. [name] Primary Care Candies Palm: [name] Other Clinician: Referring Umi Mainor: Treating Cherice Glennie/Extender: Bethena Andre [name] Weeks in Treatment: 11 Vital Signs Time Taken: 12:26 Temperature (F): 97.5 Height (in): 63 Pulse (bpm): 86 Weight (lbs): 141.8 Respiratory Rate (breaths/min): 18 Body Mass Index (BMI): 25.1 Blood Pressure (mmHg): 110/68 Reference Range: 80 - 120 mg / dl Electronic Signature(s) Signed: 10/12/2023 5:05:37 PM By: [name] MSN RN CNS WTA Entered By: Claudene [name] on 10/12/2023 09:26:38

## 2023-10-16 ENCOUNTER — Ambulatory Visit: Payer: Medicare Other | Admitting: Physical Therapy

## 2023-10-16 ENCOUNTER — Telehealth: Payer: Self-pay

## 2023-10-16 NOTE — Telephone Encounter (Signed)
 [facility] Pre-operative Risk Assessment     Request for surgical clearance:     Endoscopy Procedure  What type of surgery is being performed?     Upper Endoscopy, Colonoscopy  When is this surgery scheduled?     TBD  What type of clearance is required ?   Pharmacy, Cardiac   Are there any medications that need to be held prior to surgery and how long? Eliquis  2 days  Practice name and name of physician performing surgery?      [facility]; Dr Charlanne  What is your office phone and fax number?      Phone- [phone]  Fax- [phone]  Anesthesia type (None, local, MAC, general) ?       MAC   Please route your response to Elspeth Munroe RN

## 2023-10-16 NOTE — Telephone Encounter (Signed)
 Patient with diagnosis of Afib on Eliquis  for anticoagulation.    Procedure: Upper Endoscopy, Colonoscopy  Date of procedure: TBD   CHA2DS2-VASc Score = 6   This indicates a 9.7% annual risk of stroke. The patient's score is based upon: CHF History: 1 HTN History: 1 Diabetes History: 0 Stroke History: 0 Vascular Disease History: 1 Age Score: 2 Gender Score: 1    CrCl 53 mL/min Platelet count 121 K    Per office protocol, patient can hold Eliquis  for 2 days prior to procedure.     **This guidance is not considered finalized until pre-operative APP has relayed final recommendations.**

## 2023-10-17 ENCOUNTER — Encounter: Payer: Self-pay | Admitting: Cardiovascular Disease

## 2023-10-17 NOTE — Telephone Encounter (Signed)
 [name], this patient has an appointment with you on 10/24/23. Would you please conduct her cardiac evaluation for risk for upcoming EGD and colonoscopy?  At the completion of your visit, please fax your note to [facility]ology if she is cleared.  Guidelines for holding Eliquis  are attached.  Thank you, Rosaline

## 2023-10-18 ENCOUNTER — Ambulatory Visit: Payer: Medicare Other | Admitting: Physical Therapy

## 2023-10-22 ENCOUNTER — Other Ambulatory Visit: Payer: Self-pay | Admitting: Family Medicine

## 2023-10-22 DIAGNOSIS — E785 Hyperlipidemia, unspecified: Secondary | ICD-10-CM

## 2023-10-22 DIAGNOSIS — G8929 Other chronic pain: Secondary | ICD-10-CM

## 2023-10-22 NOTE — Progress Notes (Signed)
 Electrophysiology Office Note:   Date:  10/24/2023  ID:  [name],  Mar 11, 1946, MRN [id number]  Primary Cardiologist: [name], MD Primary Heart Failure: None Electrophysiologist: Dr. Balding     History of Present Illness:   [name] is a 78 y.o. 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    She was seen in December 2024 post hospital visit for decompensated HF.  She had elevated atrial rates at that time and was put back on Toprol  XL low dose. The patient is pending a colonoscopy and steroid injection in her back.   Since last being seen in our clinic the patient reports she has been having back pain. She indicates it was very difficult for her to walk into the clinic today as she had to go down stairs.  She notes she did not take all of her am meds today as she did not eat breakfast. She has noted fatigue in the last few days.    She denies chest pain, palpitations, dyspnea, PND, orthopnea, nausea, vomiting, dizziness, syncope, edema, weight gain, or early satiety.   Review of systems complete and found to be negative unless listed in HPI.   EP Information / Studies Reviewed:    EKG is not ordered today. EKG from 12/2/4 reviewed which showed ASVP 85 bpm      PPM Interrogation-  reviewed in detail today,  See PACEART report.  Device History: Abbott Dual Chamber PPM implanted 06/16/16 for Second Degree AV block  Studies:  Myoview  02/2018 > nuclear stress EF 57%, normal study, low risk study ECHO 08/28/23 > LVEF >75%, no RWMA, mod LV hypertrophy, severely elevated PA systolic pressure, RV systolic pressure 64, LA/RA mildly dilated, prosthetic MV well-seated with no regurgitation / stenosis, mod TV regurgitation, bioprosthetic AV well seated, no stenosis or regurgitation    Arrhythmia / AAD AF / AFL   Risk Assessment/Calculations:    CHA2DS2-VASc Score = 6   This indicates a 9.7% annual risk of stroke. The patient's score is based upon: CHF History: 1 HTN History: 1 Diabetes History: 0 Stroke History: 0 Vascular Disease History: 1 Age Score: 2 Gender Score: 1    HYPERTENSION CONTROL Vitals:   10/24/23 0914 10/24/23 0928 10/24/23 0935  BP: (!) 200/120 (!) 180/110 (!) 160/90    The patient's blood pressure is elevated above target today.  In order to address the patient's elevated BP: Blood pressure will be monitored at home to determine if medication changes need to be made.          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 Previously intolerant to bisoprolol  with orthostasis/syncope.  -OAC for stroke prophylaxis  -continue Toprol  25mg  at bedtime   -device showed episodes of AT with blocked PAC's vs AF > Sep 18, 2023 lasting 6 hours, Oct 20, 2023 lasting 14 h.  Longest episodes on 1/10, 12/9   Secondary Hypercoagulable State  CHA2DS2-VASc 6 -Eliquis  5mg  BID, appropriate dosing by age / wt   HFpEF   Discharge wt in 08/2023 149 lbs, last visit  in EP Clinic 146 lbs -euvolemic on exam  -wt stable today in clinic 149 llbs  Hypertension  -BP elevated in clinic, pt has not taken all her am meds & had a physically difficult time getting into the clinic / walking stairs. Serial checks, BP reduced to 160 systolic on my check.  Pt reports is not high at home after taking her meds.    Rheumatic Valvular Disease  S/p MV / AV replacement -normal valve function on ECHO 08/2023   Pre-Procedure Surgical Clearance     Ms. Woehrle perioperative risk of a major cardiac event is 0.9% according to the Revised Cardiac Risk Index (RCRI).  Therefore, she is at low risk for perioperative complications.    Recommendations: According to ACC/AHA guidelines, no further cardiovascular testing needed.  The patient may proceed to surgery at acceptable risk.   Antiplatelet and/or Anticoagulation Recommendations:  Eliquis  (Apixaban ) can be held for 2 days prior to surgery.  Please resume post op when felt to be safe.     Disposition:   Follow up with Dr. Francyne or EP APP   4 months   Signed, Daphne Barrack, MSN, APRN, NP-C, AGACNP-BC [facility] - Electrophysiology  10/24/2023, 1:11 PM

## 2023-10-23 NOTE — Telephone Encounter (Signed)
 Name of Medication:  Tramadol  Name of Pharmacy:  [facility] Last Fill or Written Date and Quantity:  08/25/23, #60 Last Office Visit and Type:  09/05/23, hosp f/u Next Office Visit and Type:  11/07/23, 3 mo f/u Last Controlled Substance Agreement Date:  none Last UDS:  none

## 2023-10-24 ENCOUNTER — Other Ambulatory Visit: Payer: Self-pay | Admitting: Pulmonary Disease

## 2023-10-24 ENCOUNTER — Encounter: Payer: Self-pay | Admitting: Pulmonary Disease

## 2023-10-24 ENCOUNTER — Ambulatory Visit: Payer: Medicare Other | Attending: Pulmonary Disease | Admitting: Pulmonary Disease

## 2023-10-24 VITALS — BP 160/90 | HR 87 | Ht 64.0 in | Wt 149.8 lb

## 2023-10-24 DIAGNOSIS — Z950 Presence of cardiac pacemaker: Secondary | ICD-10-CM

## 2023-10-24 DIAGNOSIS — I441 Atrioventricular block, second degree: Secondary | ICD-10-CM

## 2023-10-24 DIAGNOSIS — I495 Sick sinus syndrome: Secondary | ICD-10-CM | POA: Diagnosis not present

## 2023-10-24 DIAGNOSIS — I480 Paroxysmal atrial fibrillation: Secondary | ICD-10-CM

## 2023-10-24 DIAGNOSIS — I5032 Chronic diastolic (congestive) heart failure: Secondary | ICD-10-CM

## 2023-10-24 DIAGNOSIS — D6869 Other thrombophilia: Secondary | ICD-10-CM

## 2023-10-24 LAB — CUP PACEART INCLINIC DEVICE CHECK
Battery Remaining Longevity: 16 mo
Battery Voltage: 2.84 V
Brady Statistic RA Percent Paced: 60 %
Brady Statistic RV Percent Paced: 98 %
Date Time Interrogation Session: 20250114124901
Implantable Lead Connection Status: 753985
Implantable Lead Connection Status: 753985
Implantable Lead Implant Date: 20170907
Implantable Lead Implant Date: 20170907
Implantable Lead Location: 753859
Implantable Lead Location: 753860
Implantable Pulse Generator Implant Date: 20170907
Lead Channel Impedance Value: 362.5 Ohm
Lead Channel Impedance Value: 475 Ohm
Lead Channel Pacing Threshold Amplitude: 0.75 V
Lead Channel Pacing Threshold Amplitude: 0.75 V
Lead Channel Pacing Threshold Amplitude: 0.75 V
Lead Channel Pacing Threshold Amplitude: 0.75 V
Lead Channel Pacing Threshold Pulse Width: 0.5 ms
Lead Channel Pacing Threshold Pulse Width: 0.5 ms
Lead Channel Pacing Threshold Pulse Width: 0.5 ms
Lead Channel Pacing Threshold Pulse Width: 0.5 ms
Lead Channel Sensing Intrinsic Amplitude: 1.7 mV
Lead Channel Sensing Intrinsic Amplitude: 8.8 mV
Lead Channel Setting Pacing Amplitude: 2 V
Lead Channel Setting Pacing Amplitude: 2 V
Lead Channel Setting Pacing Pulse Width: 0.5 ms
Lead Channel Setting Sensing Sensitivity: 2.5 mV
Pulse Gen Model: 2272
Pulse Gen Serial Number: 7945283

## 2023-10-24 NOTE — Patient Instructions (Addendum)
 Medication Instructions:  Your physician recommends that you continue on your current medications as directed. Please refer to the Current Medication list given to you today.  *If you need a refill on your cardiac medications before your next appointment, please call your pharmacy*  Lab Work: None ordered.  If you have labs (blood work) drawn today and your tests are completely normal, you will receive your results only by: MyChart Message (if you have MyChart) OR A paper copy in the mail If you have any lab test that is abnormal or we need to change your treatment, we will call you to review the results.  Testing/Procedures: None ordered.  Follow-Up:  Please take your medications once you get home and call if you have any issues.  At [facility], you and your health needs are our priority.  As part of our continuing mission to provide you with exceptional heart care, we have created designated Provider Care Teams.  These Care Teams include your primary Cardiologist (physician) and Advanced Practice Providers (APPs -  Physician Assistants and Nurse Practitioners) who all work together to provide you with the care you need, when you need it.  We recommend signing up for the patient portal called MyChart.  Sign up information is provided on this After Visit Summary.  MyChart is used to connect with patients for Virtual Visits (Telemedicine).  Patients are able to view lab/test results, encounter notes, upcoming appointments, etc.  Non-urgent messages can be sent to your provider as well.   To learn more about what you can do with MyChart, go to [email or web address].    Your next appointment:   4 months  The format for your next appointment:   In Person  Provider:   Dr Croitru{or one of the following Advanced Practice Providers on your designated Care Team:   Charlies Arthur, [place] Ozell Jodie Passey, [place] Leotis Barrack, NP  Remote monitoring is used to monitor your Pacemaker/  ICD from home. This monitoring reduces the number of office visits required to check your device to one time per year. It allows us  to keep an eye on the functioning of your device to ensure it is working properly.  Important Information About Sugar

## 2023-10-24 NOTE — Telephone Encounter (Signed)
 ERx

## 2023-10-27 ENCOUNTER — Ambulatory Visit: Payer: Medicare Other

## 2023-10-27 DIAGNOSIS — I442 Atrioventricular block, complete: Secondary | ICD-10-CM

## 2023-10-27 LAB — CUP PACEART REMOTE DEVICE CHECK
Battery Remaining Longevity: 17 mo
Battery Remaining Percentage: 18 %
Battery Voltage: 2.84 V
Brady Statistic AP VP Percent: 89 %
Brady Statistic AP VS Percent: 2.7 %
Brady Statistic AS VP Percent: 8.1 %
Brady Statistic AS VS Percent: 1 %
Brady Statistic RA Percent Paced: 91 %
Brady Statistic RV Percent Paced: 97 %
Date Time Interrogation Session: 20250117040016
Implantable Lead Connection Status: 753985
Implantable Lead Connection Status: 753985
Implantable Lead Implant Date: 20170907
Implantable Lead Implant Date: 20170907
Implantable Lead Location: 753859
Implantable Lead Location: 753860
Implantable Pulse Generator Implant Date: 20170907
Lead Channel Impedance Value: 390 Ohm
Lead Channel Impedance Value: 490 Ohm
Lead Channel Pacing Threshold Amplitude: 0.75 V
Lead Channel Pacing Threshold Amplitude: 0.75 V
Lead Channel Pacing Threshold Pulse Width: 0.5 ms
Lead Channel Pacing Threshold Pulse Width: 0.5 ms
Lead Channel Sensing Intrinsic Amplitude: 2.4 mV
Lead Channel Sensing Intrinsic Amplitude: 8.8 mV
Lead Channel Setting Pacing Amplitude: 2 V
Lead Channel Setting Pacing Amplitude: 2 V
Lead Channel Setting Pacing Pulse Width: 0.5 ms
Lead Channel Setting Sensing Sensitivity: 2.5 mV
Pulse Gen Model: 2272
Pulse Gen Serial Number: 7945283

## 2023-11-02 ENCOUNTER — Telehealth: Payer: Self-pay | Admitting: *Deleted

## 2023-11-02 ENCOUNTER — Encounter: Payer: Self-pay | Admitting: Cardiovascular Disease

## 2023-11-02 NOTE — Telephone Encounter (Signed)
   Pre-operative Risk Assessment    Patient Name: [name]  DOB: April 20, 1946 MRN: [id number]   Date of last office visit: 10/24/2023 Date of next office visit: None  Request for Surgical Clearance    Procedure:   Lumbar Spine - L1-L2 translam with sedation.  Date of Surgery:  Clearance TBD                                 Surgeon:  Dr. Barnett Abu Surgeon's Group or Practice Name:  [facility]  Phone number:  [phone] Fax number:  [phone]   Type of Clearance Requested:   - Medical  - Pharmacy:  Hold Apixaban (Eliquis) 3 days prior.    Type of Anesthesia:   Sedation   Additional requests/questions:    Signed, Emmit Pomfret   11/02/2023, 9:22 AM

## 2023-11-02 NOTE — Telephone Encounter (Signed)
Patient with diagnosis of afib on Eliquis for anticoagulation.    Procedure:  Lumbar Spine - L1-L2 translam with sedation.  Date of procedure: TBD   CHA2DS2-VASc Score = 6   This indicates a 9.7% annual risk of stroke. The patient's score is based upon: CHF History: 1 HTN History: 1 Diabetes History: 0 Stroke History: 0 Vascular Disease History: 1 Age Score: 2 Gender Score: 1       CrCl 53 ml/min Platelet count 121  Per office protocol, patient can hold Eliquis for 3 days prior to procedure.     **This guidance is not considered finalized until pre-operative APP has relayed final recommendations.**

## 2023-11-02 NOTE — Telephone Encounter (Signed)
Pharmacy please advise on holding Eliquis prior to lumbar ESI scheduled for TBD. Thank you.   

## 2023-11-02 NOTE — Telephone Encounter (Signed)
   Patient Name: [name]  DOB: 04-Mar-1946 MRN: [id number]  Primary Cardiologist: Thurmon Fair, MD  Clinical pharmacists have reviewed the patient's past medical history, labs, and current medications as part of preoperative protocol coverage. The following recommendations have been made:  Per office protocol, patient can hold Eliquis for 3 days prior to procedure.    I will route this recommendation to the requesting party via Epic fax function and remove from pre-op pool.  Please call with questions.  Napoleon Form, Leodis Rains, NP 11/02/2023, 4:32 PM

## 2023-11-02 NOTE — Progress Notes (Signed)
PERIOPERATIVE PRESCRIPTION FOR IMPLANTED CARDIAC DEVICE PROGRAMMING   Patient Information:  Patient: [name]  MRN: [id number]  Date of Birth: 12-01-1945          Pre-operative Risk Assessment    Patient Name: [name]  DOB: [birth year] MRN: [id number]   Date of last office visit: 10/24/2023 Date of next office visit: None   Request for Surgical Clearance Surgeon:  Dr. Barnett Abu Surgeon's Group or Practice Name:  [facility]  Phone number:  [phone] Fax number:  [phone] Procedure:   Lumbar Spine - L1-L2 translam with sedation. Date of Surgery:  Clearance TBD                                     Device Information:   Clinic EP Physician:   Dr. Rachelle Hora Croitoru Device Type:  Pacemaker Manufacturer and Phone #:  [employer]: [phone] Pacemaker Dependent?:  Unknown Date of Last Device Check:  10/27/2023        Normal Device Function?:  Yes     Electrophysiologist's Recommendations:   Have magnet available. Provide continuous ECG monitoring when magnet is used or reprogramming is to be performed.  Procedure will likely interfere with device function.  Device should be programmed:  Asynchronous pacing during procedure and returned to normal programming after procedure  Per Device Clinic Standing Orders, Lenor Coffin  11/02/2023 9:41 AM

## 2023-11-04 ENCOUNTER — Encounter: Payer: Self-pay | Admitting: Cardiovascular Disease

## 2023-11-07 ENCOUNTER — Encounter: Payer: Self-pay | Admitting: Family Medicine

## 2023-11-07 ENCOUNTER — Ambulatory Visit (INDEPENDENT_AMBULATORY_CARE_PROVIDER_SITE_OTHER): Payer: Medicare Other | Admitting: Family Medicine

## 2023-11-07 VITALS — BP 152/82 | HR 81 | Temp 97.8°F | Ht 64.0 in | Wt 150.4 lb

## 2023-11-07 DIAGNOSIS — Z2239 Carrier of other specified bacterial diseases: Secondary | ICD-10-CM

## 2023-11-07 DIAGNOSIS — G8929 Other chronic pain: Secondary | ICD-10-CM

## 2023-11-07 DIAGNOSIS — J449 Chronic obstructive pulmonary disease, unspecified: Secondary | ICD-10-CM | POA: Diagnosis not present

## 2023-11-07 DIAGNOSIS — I10 Essential (primary) hypertension: Secondary | ICD-10-CM

## 2023-11-07 DIAGNOSIS — M545 Low back pain, unspecified: Secondary | ICD-10-CM

## 2023-11-07 DIAGNOSIS — F4323 Adjustment disorder with mixed anxiety and depressed mood: Secondary | ICD-10-CM

## 2023-11-07 DIAGNOSIS — R21 Rash and other nonspecific skin eruption: Secondary | ICD-10-CM | POA: Diagnosis not present

## 2023-11-07 DIAGNOSIS — I5032 Chronic diastolic (congestive) heart failure: Secondary | ICD-10-CM

## 2023-11-07 DIAGNOSIS — D539 Nutritional anemia, unspecified: Secondary | ICD-10-CM

## 2023-11-07 DIAGNOSIS — N3946 Mixed incontinence: Secondary | ICD-10-CM

## 2023-11-07 DIAGNOSIS — I4819 Other persistent atrial fibrillation: Secondary | ICD-10-CM

## 2023-11-07 MED ORDER — LISINOPRIL 5 MG PO TABS: 5.0000 mg | ORAL_TABLET | Freq: Every day | ORAL | 1 refills | Status: DC

## 2023-11-07 NOTE — Progress Notes (Unsigned)
Ph: [phone] Fax: [phone]   Patient ID: [name], female    DOB: [birth year], 78 y.o.   MRN: [id number]  This visit was conducted in person.  BP (!) 152/82   Pulse 81   Temp 97.8 F (36.6 C) (Oral)   Ht 5\' 4"  (1.626 m)   Wt 150 lb 6 oz (68.2 kg)   SpO2 95%   BMI 25.81 kg/m   BP Readings from Last 3 Encounters:  11/07/23 (!) 152/82  10/24/23 (!) 160/90  09/25/23 130/70   158/90 on repeat testing   CC: 3 mo f/u visit  Subjective:   HPI: [name] is a 78 y.o. 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 Known rheumatic valvular disease.   Stopped seeing wound clinic for chronic heel ulcers which have persisted.  Completed PT 09/2023 - insurance stopped covering this.   HTN - bp elevated today despite lisinopril 2.5mg  daily, toprol XL 25mg  nightly. Has not been checking BP at home.   Saw GI Dr Chales Abrahams for heme-positive stool, h/o rectal cancer 2012, chronic anemia followed by heme/onc Truett Perna) s/p reassuring bone marrow biopsy 08/2023. Contrasted CT abd/pelvis overall without acute findings but there was soft tissue thickening around anastomotic suture line thought post-treatment changes however planned colonoscopy to further investigate this. Has received cardiac clearance to hold Eliquis 2d prior to procedure.   Notes ongoing intermittently mushy stools, taking colace every other day. She is not taking miralax. She started taking 2 fiber pills at night.   OP - hasn't yet started fosamax.   Ongoing chronic back pain - manages with gabapentin 300mg  nightly, 100mg  BID during day (significant sedation with this dose), tramadol 50mg  PRN. She finds 2 tramadol tablets help control back pain better than one at a time.  Saw Dr Danielle Dess 10/2023- suggested rpt steroid injection which is pending.    Pulm - sees Dr Marchelle Gearing for severe COPD, lung nodules, pseudomonas aeruginosa colonzation. Recently started new handheld OHTUVYARE nebulizer. Next appt is late 11/2022  Ongoing urinary incontinence despite gemtesa and interstim device with daily bladder accidents despite pad use. Upcoming urology f/u 02/2024.      Relevant past medical, surgical, family and social history reviewed and updated as indicated. Interim medical history since our last visit reviewed. Allergies and medications reviewed and updated. Outpatient Medications Prior to Visit  Medication Sig Dispense Refill   acetaminophen (TYLENOL) 500 MG tablet Take 2 tablets (1,000 mg total) by mouth daily. (Patient taking differently: Take 1,000 mg by mouth in the morning.)     acidophilus (RISAQUAD) CAPS capsule Take 1 capsule by mouth daily.     albuterol (VENTOLIN HFA) 108 (90 Base) MCG/ACT inhaler Inhale 2 puffs into the lungs every 6 (six) hours as needed for wheezing. 18 g 6   ascorbic acid (VITAMIN C) 500 MG tablet Take 1 tablet (500 mg total) by mouth daily.     Biotin 1000 MCG tablet Take 1,000 mcg by mouth daily.     buPROPion (WELLBUTRIN SR) 150 MG 12 hr tablet TAKE 1 TABLET(150 MG) BY MOUTH EVERY MORNING (Patient taking differently: Take 150 mg by mouth in the morning.) 90 tablet 1   calcium carbonate (OSCAL) 1500 (600 Ca) MG TABS tablet Take 600 mg of elemental calcium by mouth daily.     carboxymethylcellulose (REFRESH PLUS)  0.5 % SOLN Place 1 drop into both eyes 3 (three) times daily as needed (dry eyes).     cephALEXin (KEFLEX) 250 MG capsule Take 250 mg by mouth daily with breakfast.     cholecalciferol (VITAMIN D3) 25 MCG (1000 UNIT) tablet Take 1,000 Units by mouth daily.     docusate sodium (COLACE) 100 MG capsule Take 200 mg by mouth daily.     ELIQUIS 5 MG TABS tablet TAKE 1 TABLET(5 MG) BY MOUTH TWICE DAILY (Patient taking differently: Take 5 mg by mouth in the morning and at bedtime.) 180 tablet 1   ferrous  sulfate 325 (65 FE) MG EC tablet Take 1 tablet (325 mg total) by mouth daily with breakfast.     Fluticasone-Umeclidin-Vilant (TRELEGY ELLIPTA) 200-62.5-25 MCG/ACT AEPB Inhale 1 puff into the lungs daily. 60 each 11   furosemide (LASIX) 40 MG tablet TAKE 1 TABLET BY MOUTH EVERY DAY. MAY TAKE AN EXTRA TABLET AS NEEDED FOR SWELLING (Patient taking differently: Take 40 mg by mouth in the morning.) 135 tablet 2   gabapentin (NEURONTIN) 100 MG capsule Take 1 capsule (100 mg total) by mouth 2 (two) times daily as needed (back pain - for daytime use). (Patient taking differently: Take 100 mg by mouth daily.) 60 capsule 1   gabapentin (NEURONTIN) 300 MG capsule Take 300 mg by mouth at bedtime.     hydrocortisone (ANUSOL-HC) 2.5 % rectal cream Place 1 Application rectally 2 (two) times daily. (Patient taking differently: Place 1 Application rectally 2 (two) times daily as needed for hemorrhoids (or pain).) 30 g 0   ipratropium-albuterol (DUONEB) 0.5-2.5 (3) MG/3ML SOLN Take 3 mLs by nebulization every 6 (six) hours as needed. 360 mL 0   lovastatin (MEVACOR) 40 MG tablet TAKE 1 TABLET(40 MG) BY MOUTH AT BEDTIME 90 tablet 2   metoprolol succinate (TOPROL XL) 25 MG 24 hr tablet Take 1 tablet (25 mg total) by mouth every evening. 90 tablet 3   montelukast (SINGULAIR) 10 MG tablet TAKE 1 TABLET(10 MG) BY MOUTH AT BEDTIME 90 tablet 1   Multiple Vitamins-Minerals (PRESERVISION AREDS 2 PO) Take 2 capsules by mouth in the morning.     omeprazole (PRILOSEC) 20 MG capsule Take 1 capsule (20 mg total) by mouth daily. 90 capsule 3   ondansetron (ZOFRAN-ODT) 4 MG disintegrating tablet Take 1 tablet (4 mg total) by mouth every 8 (eight) hours as needed for nausea or vomiting. 20 tablet 1   Phenazopyrid-Cranbry-C-Probiot (AZO URINARY TRACT SUPPORT PO) Take 1 capsule by mouth daily.     polyethylene glycol (MIRALAX / GLYCOLAX) packet Take 17 g by mouth daily. (Patient taking differently: Take 17 g by mouth daily as needed for  mild constipation.) 14 each 0   potassium chloride (KLOR-CON) 10 MEQ tablet TAKE 2 TABLETS(20 MEQ) BY MOUTH DAILY (Patient taking differently: Take 20 mEq by mouth in the morning.) 180 tablet 1   psyllium (REGULOID) 0.52 g capsule Take 0.52 g by mouth in the morning and at bedtime.     traZODone (DESYREL) 50 MG tablet TAKE 1/2 TO 1 TABLET(25 TO 50 MG) BY MOUTH AT BEDTIME (Patient taking differently: Take 50 mg by mouth at bedtime.) 30 tablet 6   Vibegron (GEMTESA) 75 MG TABS Take 75 mg by mouth daily.     vitamin B-12 (CYANOCOBALAMIN) 500 MCG tablet Take 1 tablet (500 mcg total) by mouth 2 (two) times a week.     Zinc 50 MG CAPS Take 1 capsule (50 mg total) by  mouth daily.  0   lisinopril (ZESTRIL) 2.5 MG tablet Take 1 tablet (2.5 mg total) by mouth daily. 30 tablet 2   traMADol (ULTRAM) 50 MG tablet Take 1 tablet (50 mg total) by mouth 2 (two) times daily as needed for moderate pain (pain score 4-6). 60 tablet 0   alendronate (FOSAMAX) 70 MG tablet Take 1 tablet (70 mg total) by mouth every 7 (seven) days. Take with a full glass of water on an empty stomach. (Patient not taking: Reported on 11/07/2023) 4 tablet 11   traMADol (ULTRAM) 50 MG tablet Take 1-2 tablets (50-100 mg total) by mouth 2 (two) times daily as needed for moderate pain (pain score 4-6).     No facility-administered medications prior to visit.     Per HPI unless specifically indicated in ROS section below Review of Systems  Objective:  BP (!) 152/82   Pulse 81   Temp 97.8 F (36.6 C) (Oral)   Ht 5\' 4"  (1.626 m)   Wt 150 lb 6 oz (68.2 kg)   SpO2 95%   BMI 25.81 kg/m   Wt Readings from Last 3 Encounters:  11/07/23 150 lb 6 oz (68.2 kg)  10/24/23 149 lb 12.8 oz (67.9 kg)  09/25/23 149 lb (67.6 kg)      Physical Exam Vitals and nursing note reviewed.  Constitutional:      Appearance: Normal appearance. She is not ill-appearing.  HENT:     Head: Normocephalic and atraumatic.     Mouth/Throat:     Mouth: Mucous  membranes are moist.     Pharynx: Oropharynx is clear. No oropharyngeal exudate or posterior oropharyngeal erythema.  Eyes:     Extraocular Movements: Extraocular movements intact.     Pupils: Pupils are equal, round, and reactive to light.  Neck:     Thyroid: No thyroid mass or thyromegaly.  Cardiovascular:     Rate and Rhythm: Normal rate and regular rhythm.     Pulses: Normal pulses.     Heart sounds: Murmur (3/6 systolic throughout) heard.  Pulmonary:     Effort: Pulmonary effort is normal. No respiratory distress.     Breath sounds: Rhonchi and rales present. No wheezing.     Comments:  Coarse breath sounds throughout Musculoskeletal:     Cervical back: Normal range of motion and neck supple.     Right lower leg: No edema.     Left lower leg: No edema.  Skin:    General: Skin is warm and dry.     Findings: Rash present.     Comments: Dry scaly skin predominantly to R>L eyebrows  Neurological:     Mental Status: She is alert.        Lab Results  Component Value Date   NA 143 09/25/2023   CL 102 09/25/2023   K 3.8 09/25/2023   CO2 34 (H) 09/25/2023   BUN 15 09/25/2023   CREATININE 0.94 09/25/2023   GFR 58.23 (L) 09/25/2023   CALCIUM 9.2 09/25/2023   PHOS 3.7 06/21/2023   ALBUMIN 3.2 (L) 09/25/2023   GLUCOSE 77 09/25/2023    Lab Results  Component Value Date   WBC 4.7 09/25/2023   HGB 11.6 (L) 09/25/2023   HCT 35.8 (L) 09/25/2023   MCV 100.3 (H) 09/25/2023   PLT 121.0 (L) 09/25/2023      11/07/2023    9:20 AM 09/05/2023    2:31 PM 08/17/2023    1:54 PM 06/28/2023   10:02 AM 06/15/2023  12:07 PM  Depression screen PHQ 2/9  Decreased Interest 1 0 1 1 1   Down, Depressed, Hopeless 1 1 1 1 1   PHQ - 2 Score 2 1 2 2 2   Altered sleeping 1 2 0 1 2  Tired, decreased energy 1 2 1 1 2   Change in appetite 1 0 0 1 1  Feeling bad or failure about yourself  0 0 0 0 0  Trouble concentrating 0 0 0 0 0  Moving slowly or fidgety/restless 0 0 0 1 0  Suicidal thoughts 0 0  0 0 0  PHQ-9 Score 5 5 3 6 7   Difficult doing work/chores Somewhat difficult Somewhat difficult Not difficult at all Somewhat difficult Somewhat difficult    Assessment & Plan:   Problem List Items Addressed This Visit     Essential hypertension, benign - Primary   Chronic, BP above goal. I will increase lisinopril to 5mg  daily, continue toprol XL, and have asked her to start monitoring at home and send me readings to ensure remaining well controlled. Reviewed increased water, limiting salt/sodium in diet.       Relevant Medications   lisinopril (ZESTRIL) 5 MG tablet   COPD (chronic obstructive pulmonary disease)   Chronic, severe, with pseudomonas colonization, followed by  pulm managed with Trelegy. Chronically coarse breath sounds. Most recently started on Ohtuvyare PDE3/4 inhibitor nebulizer.       Chronic lower back pain   Discussed tramadol use. She finds 100mg  is more effective for pain control, uses intermittently - will change next Rx sig to 50-100mg  per dose.       Relevant Medications   traMADol (ULTRAM) 50 MG tablet   Macrocytic anemia   H/o chronic macrocytic anemia, has seen hematology sp reassuring bone marrow biopsy.  Now with positive iFOB and h/o noted blood in stool  currently under evaluation by GI discussing colonoscopy/endoscopy, pending scheduling appt.  Has received cardiac clearance to hold Eliquis 2d prior to procedure.  Remote history of rectal cancer.       Chronic heart failure with preserved ejection fraction (HFpEF) (HCC)   Seems euvolemic, will increase lisinopril to 5mg  as per above due to elevated BP readings.  Continue toprol XL and furosemide 40mg  daily.       Relevant Medications   lisinopril (ZESTRIL) 5 MG tablet   Adjustment disorder with mixed anxiety and depressed mood   Stable period on wellbutrin SR 150mg  daily.       Mixed stress and urge urinary incontinence   Chronic, ongoing difficulty despite Gemtesa, s/p interstim placement.   Keep uro f/u 02/2024.       Skin rash   Skin rash to face predominantly at eyebrows - possible seborrheic dermatitis. Suggested trial OTC lotrimin to eyebrows. She does have upcoming derm appt pending.      Persistent atrial fibrillation   Continues BB and eliquis 5mg  bid      Relevant Medications   lisinopril (ZESTRIL) 5 MG tablet   Pseudomonas aeruginosa colonization     Meds ordered this encounter  Medications   lisinopril (ZESTRIL) 5 MG tablet    Sig: Take 1 tablet (5 mg total) by mouth daily.    Dispense:  90 tablet    Refill:  1    No orders of the defined types were placed in this encounter.   Patient Instructions  BP too high - increase lisinopril to 5mg  daily. New dose at pharmacy. Start checking blood pressures at home and let us  know if consistently >150/90. Limit salt in the diet. Increase water intake.  May try lotrimin (clotrimazole) over the counter to scaly rash to eyebrows (possible seborrheic dermatitis). Keep dermatology appointment Return in 3 months for follow up visit   Follow up plan: Return in 3 months (on 02/05/2024), or if symptoms worsen or fail to improve, for follow up visit.  [name], MD

## 2023-11-07 NOTE — Patient Instructions (Addendum)
BP too high - increase lisinopril to 5mg  daily. New dose at pharmacy. Start checking blood pressures at home and let us know if consistently >150/90. Limit salt in the diet. Increase water intake.  May try lotrimin (clotrimazole) over the counter to scaly rash to eyebrows (possible seborrheic dermatitis). Keep dermatology appointment Return in 3 months for follow up visit

## 2023-11-08 NOTE — Assessment & Plan Note (Signed)
Continues BB and eliquis 5mg  bid

## 2023-11-08 NOTE — Assessment & Plan Note (Addendum)
Seems euvolemic, will increase lisinopril to 5mg  as per above due to elevated BP readings.  Continue toprol XL and furosemide 40mg  daily.

## 2023-11-08 NOTE — Assessment & Plan Note (Signed)
Discussed tramadol use. She finds 100mg  is more effective for pain control, uses intermittently - will change next Rx sig to 50-100mg  per dose.

## 2023-11-08 NOTE — Assessment & Plan Note (Signed)
Chronic, ongoing difficulty despite Gemtesa, s/p interstim placement.  Keep uro f/u 02/2024.

## 2023-11-08 NOTE — Assessment & Plan Note (Signed)
Skin rash to face predominantly at eyebrows - possible seborrheic dermatitis. Suggested trial OTC lotrimin to eyebrows. She does have upcoming derm appt pending.

## 2023-11-08 NOTE — Assessment & Plan Note (Addendum)
H/o chronic macrocytic anemia, has seen hematology sp reassuring bone marrow biopsy.  Now with positive iFOB and h/o noted blood in stool  currently under evaluation by GI discussing colonoscopy/endoscopy, pending scheduling appt.  Has received cardiac clearance to hold Eliquis 2d prior to procedure.  Remote history of rectal cancer.

## 2023-11-08 NOTE — Assessment & Plan Note (Signed)
Chronic, BP above goal. I will increase lisinopril to 5mg  daily, continue toprol XL, and have asked her to start monitoring at home and send me readings to ensure remaining well controlled. Reviewed increased water, limiting salt/sodium in diet.

## 2023-11-08 NOTE — Assessment & Plan Note (Signed)
Chronic, severe, with pseudomonas colonization, followed by  pulm managed with Trelegy. Chronically coarse breath sounds. Most recently started on Ohtuvyare PDE3/4 inhibitor nebulizer.

## 2023-11-08 NOTE — Assessment & Plan Note (Signed)
Stable period on wellbutrin SR 150mg  daily.

## 2023-11-10 ENCOUNTER — Encounter: Payer: Self-pay | Admitting: Gastroenterology

## 2023-11-13 ENCOUNTER — Other Ambulatory Visit: Payer: Self-pay

## 2023-11-13 DIAGNOSIS — Z85048 Personal history of other malignant neoplasm of rectum, rectosigmoid junction, and anus: Secondary | ICD-10-CM

## 2023-11-13 DIAGNOSIS — R195 Other fecal abnormalities: Secondary | ICD-10-CM

## 2023-11-13 DIAGNOSIS — Z1211 Encounter for screening for malignant neoplasm of colon: Secondary | ICD-10-CM

## 2023-11-13 DIAGNOSIS — C20 Malignant neoplasm of rectum: Secondary | ICD-10-CM

## 2023-11-13 MED ORDER — SUFLAVE 178.7 G PO SOLR: 1.0000 | Freq: Once | ORAL | 0 refills | Status: AC

## 2023-11-28 ENCOUNTER — Encounter: Payer: Self-pay | Admitting: Family Medicine

## 2023-11-29 ENCOUNTER — Telehealth (INDEPENDENT_AMBULATORY_CARE_PROVIDER_SITE_OTHER): Payer: Medicare Other | Admitting: Family Medicine

## 2023-11-29 VITALS — Ht 64.0 in | Wt 142.5 lb

## 2023-11-29 DIAGNOSIS — I4819 Other persistent atrial fibrillation: Secondary | ICD-10-CM | POA: Diagnosis not present

## 2023-11-29 DIAGNOSIS — D539 Nutritional anemia, unspecified: Secondary | ICD-10-CM | POA: Diagnosis not present

## 2023-11-29 DIAGNOSIS — R112 Nausea with vomiting, unspecified: Secondary | ICD-10-CM

## 2023-11-29 NOTE — Assessment & Plan Note (Addendum)
Chronic, followed by heme, next appt 12/2023.  Planned luminal evaluation for recent positive iFOB and blood in stool.

## 2023-11-29 NOTE — Assessment & Plan Note (Signed)
 Continue BB and eliquis

## 2023-11-29 NOTE — Assessment & Plan Note (Addendum)
Of unclear etiology - ddx includes GI illness vs med/supplement related vs intraabdominal cause Noted weight loss.  Recommend hold supplements at this time. She has not yet started fosamax - defer commencement for now.  May continue phenergan through Uro - she declines zofran refill.  Offered labwork in office for further evaluation - appt schedule and labs ordered.  She does have upcoming colonoscopy for further evaluation.  No recent blood in stool.

## 2023-11-29 NOTE — Progress Notes (Signed)
Ph: [phone] Fax: [phone]   Patient ID: [name], female    DOB: Apr 02, 1946, 78 y.o.   MRN: [id number]  Virtual visit completed through MyChart, a video enabled telemedicine application. Due to national recommendations of social distancing due to COVID-19, a virtual visit is felt to be most appropriate for this patient at this time. Reviewed limitations, risks, security and privacy concerns of performing a virtual visit and the availability of in person appointments. I also reviewed that there may be a patient responsible charge related to this service. The patient agreed to proceed.   Patient location: home Provider location: [place] at [facility], office Persons participating in this virtual visit: patient, provider   If any vitals were documented, they were collected by patient at home unless specified below.    Ht 5\' 4"  (1.626 m)   Wt 142 lb 8 oz (64.6 kg)   BMI 24.46 kg/m    CC: nausea Subjective:   HPI: [name] is a 78 y.o. female presenting on 11/29/2023 for Nausea (All last week. Improved some over the weekend but is increasing starting Monday. Home test for flu and covid negative  )   Known SSS s/p Abbott PPM, persistent atrial fibrillation followed by cardiology on eliquis 5mg  bid for h/o mitral and aortic valve replacements. H/o rectal cancer s/p surgical and chemotherapy treatment 2012.   Last week had 4 days of significant nausea and anorexia - only able to eat saltine crackers for these days. Symptoms improved for a few days but then recurred yesterday associated with vomiting x1. Today she is feeling some better. She checked COVID and flu tests and all normal. She is staying fatigued.   No fevers/chills, abd pain, chest pain, new dyspnea or cough, bowel changes, urinary symptoms (dysuria, urgency or frequency). She saw urology on Friday - UA was normal. Stools remain mushy.  8 lb weight loss since last visit 3 wks ago. She feels  she's staying well hydrated.   She has not started Fosamax yet  No new medicine changes, vitamins/supplements, no new foods.  She held vitamins last week as well as oral iron - took these again yesterday.   Continues daily keflex 250mg  for UTI ppx.  She tried some zofran.  Was just prescribed phenergan by urology.   Saw [facility] Dr Chales Abrahams for heme-positive stool, h/o rectal cancer 2012, chronic anemia followed by heme/onc Truett Perna) s/p reassuring bone marrow biopsy 08/2023. Recent CT abd/pelvis with contrast 09/2023: rectosigmoid resection with reanastomosis, soft tissue thickening around anastomotic suture line with mesorectal and presacral soft tissue/fluid - favored post-treatment changes. 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   She was started on omeprazole 20mg  daily and continues this daily.  Planned upcoming colonoscopy by Dr Chales Abrahams 01/2024 for further evaluation of heme positive stools.      Relevant past medical, surgical, family and social history reviewed and updated as indicated. Interim medical history since our last visit reviewed. Allergies and medications reviewed and updated. Outpatient Medications Prior to Visit  Medication Sig Dispense Refill   acetaminophen (TYLENOL) 500 MG tablet Take 2 tablets (1,000 mg total) by mouth daily. (Patient taking differently: Take 1,000 mg by mouth in the morning.)     acidophilus (RISAQUAD) CAPS capsule Take 1 capsule by mouth daily.     albuterol (VENTOLIN HFA) 108 (90 Base) MCG/ACT inhaler Inhale 2 puffs into the lungs every 6 (six) hours as needed for wheezing. 18 g 6   ascorbic acid (  VITAMIN C) 500 MG tablet Take 1 tablet (500 mg total) by mouth daily.     Biotin 1000 MCG tablet Take 1,000 mcg by mouth daily.     buPROPion (WELLBUTRIN SR) 150 MG 12 hr tablet TAKE 1 TABLET(150 MG) BY MOUTH EVERY MORNING (Patient taking differently: Take 150 mg by mouth in the morning.) 90 tablet 1   calcium carbonate (OSCAL)  1500 (600 Ca) MG TABS tablet Take 600 mg of elemental calcium by mouth daily.     carboxymethylcellulose (REFRESH PLUS) 0.5 % SOLN Place 1 drop into both eyes 3 (three) times daily as needed (dry eyes).     cephALEXin (KEFLEX) 250 MG capsule Take 250 mg by mouth daily with breakfast.     cholecalciferol (VITAMIN D3) 25 MCG (1000 UNIT) tablet Take 1,000 Units by mouth daily.     docusate sodium (COLACE) 100 MG capsule Take 200 mg by mouth daily.     ELIQUIS 5 MG TABS tablet TAKE 1 TABLET(5 MG) BY MOUTH TWICE DAILY (Patient taking differently: Take 5 mg by mouth in the morning and at bedtime.) 180 tablet 1   ferrous sulfate 325 (65 FE) MG EC tablet Take 1 tablet (325 mg total) by mouth daily with breakfast.     Fluticasone-Umeclidin-Vilant (TRELEGY ELLIPTA) 200-62.5-25 MCG/ACT AEPB Inhale 1 puff into the lungs daily. 60 each 11   furosemide (LASIX) 40 MG tablet TAKE 1 TABLET BY MOUTH EVERY DAY. MAY TAKE AN EXTRA TABLET AS NEEDED FOR SWELLING (Patient taking differently: Take 40 mg by mouth in the morning.) 135 tablet 2   gabapentin (NEURONTIN) 100 MG capsule Take 1 capsule (100 mg total) by mouth 2 (two) times daily as needed (back pain - for daytime use). (Patient taking differently: Take 100 mg by mouth daily.) 60 capsule 1   gabapentin (NEURONTIN) 300 MG capsule Take 300 mg by mouth at bedtime.     hydrocortisone (ANUSOL-HC) 2.5 % rectal cream Place 1 Application rectally 2 (two) times daily. (Patient taking differently: Place 1 Application rectally 2 (two) times daily as needed for hemorrhoids (or pain).) 30 g 0   ipratropium-albuterol (DUONEB) 0.5-2.5 (3) MG/3ML SOLN Take 3 mLs by nebulization every 6 (six) hours as needed. 360 mL 0   lisinopril (ZESTRIL) 5 MG tablet Take 1 tablet (5 mg total) by mouth daily. 90 tablet 1   lovastatin (MEVACOR) 40 MG tablet TAKE 1 TABLET(40 MG) BY MOUTH AT BEDTIME 90 tablet 2   metoprolol succinate (TOPROL XL) 25 MG 24 hr tablet Take 1 tablet (25 mg total) by mouth  every evening. 90 tablet 3   montelukast (SINGULAIR) 10 MG tablet TAKE 1 TABLET(10 MG) BY MOUTH AT BEDTIME 90 tablet 1   Multiple Vitamins-Minerals (PRESERVISION AREDS 2 PO) Take 2 capsules by mouth in the morning.     omeprazole (PRILOSEC) 20 MG capsule Take 1 capsule (20 mg total) by mouth daily. 90 capsule 3   polyethylene glycol (MIRALAX / GLYCOLAX) packet Take 17 g by mouth daily. (Patient taking differently: Take 17 g by mouth daily as needed for mild constipation.) 14 each 0   potassium chloride (KLOR-CON) 10 MEQ tablet TAKE 2 TABLETS(20 MEQ) BY MOUTH DAILY (Patient taking differently: Take 20 mEq by mouth in the morning.) 180 tablet 1   promethazine (PHENERGAN) 12.5 MG tablet Take 1 tablet (12.5 mg total) by mouth every 8 (eight) hours as needed for nausea or vomiting.     psyllium (REGULOID) 0.52 g capsule Take 0.52 g by mouth in  the morning and at bedtime.     SUFLAVE 178.7 g SOLR Take by mouth as directed.     traMADol (ULTRAM) 50 MG tablet Take 1-2 tablets (50-100 mg total) by mouth 2 (two) times daily as needed for moderate pain (pain score 4-6).     traZODone (DESYREL) 50 MG tablet TAKE 1/2 TO 1 TABLET(25 TO 50 MG) BY MOUTH AT BEDTIME (Patient taking differently: Take 50 mg by mouth at bedtime.) 30 tablet 6   Vibegron (GEMTESA) 75 MG TABS Take 75 mg by mouth daily.     vitamin B-12 (CYANOCOBALAMIN) 500 MCG tablet Take 1 tablet (500 mcg total) by mouth 2 (two) times a week.     Zinc 50 MG CAPS Take 1 capsule (50 mg total) by mouth daily.  0   alendronate (FOSAMAX) 70 MG tablet Take 1 tablet (70 mg total) by mouth every 7 (seven) days. Take with a full glass of water on an empty stomach. (Patient not taking: Reported on 11/07/2023) 4 tablet 11   ondansetron (ZOFRAN-ODT) 4 MG disintegrating tablet Take 1 tablet (4 mg total) by mouth every 8 (eight) hours as needed for nausea or vomiting. (Patient not taking: Reported on 11/29/2023) 20 tablet 1   Phenazopyrid-Cranbry-C-Probiot (AZO URINARY  TRACT SUPPORT PO) Take 1 capsule by mouth daily. (Patient not taking: Reported on 11/29/2023)     No facility-administered medications prior to visit.     Per HPI unless specifically indicated in ROS section below Review of Systems Objective:  Ht 5\' 4"  (1.626 m)   Wt 142 lb 8 oz (64.6 kg)   BMI 24.46 kg/m   Wt Readings from Last 3 Encounters:  11/29/23 142 lb 8 oz (64.6 kg)  11/07/23 150 lb 6 oz (68.2 kg)  10/24/23 149 lb 12.8 oz (67.9 kg)       Physical exam: Gen: alert, NAD, not ill appearing Pulm: speaks in complete sentences without increased work of breathing Psych: normal mood, normal thought content      Assessment & Plan:   Nausea and vomiting, unspecified vomiting type Assessment & Plan: Of unclear etiology - ddx includes GI illness vs med/supplement related vs intraabdominal cause Noted weight loss.  Recommend hold supplements at this time. She has not yet started fosamax - defer commencement for now.  May continue phenergan through Uro - she declines zofran refill.  Offered labwork in office for further evaluation - appt schedule and labs ordered.  She does have upcoming colonoscopy for further evaluation.  No recent blood in stool.   Orders: -     Comprehensive metabolic panel; Future -     CBC with Differential/Platelet; Future  Persistent atrial fibrillation Assessment & Plan: Continue BB and eliquis.    Macrocytic anemia Assessment & Plan: Chronic, followed by heme, next appt 12/2023.  Planned luminal evaluation for recent positive iFOB and blood in stool.       I discussed the assessment and treatment plan with the patient. The patient was provided an opportunity to ask questions and all were answered. The patient agreed with the plan and demonstrated an understanding of the instructions. The patient was advised to call back or seek an in-person evaluation if the symptoms worsen or if the condition fails to improve as anticipated.  Follow up  plan: No follow-ups on file.  [name], MD

## 2023-12-01 NOTE — Progress Notes (Signed)
 Remote pacemaker transmission.

## 2023-12-04 ENCOUNTER — Other Ambulatory Visit (INDEPENDENT_AMBULATORY_CARE_PROVIDER_SITE_OTHER): Payer: Medicare Other

## 2023-12-04 DIAGNOSIS — R112 Nausea with vomiting, unspecified: Secondary | ICD-10-CM

## 2023-12-04 LAB — CBC WITH DIFFERENTIAL/PLATELET
Basophils Absolute: 0 10*3/uL (ref 0.0–0.1)
Basophils Relative: 0.6 % (ref 0.0–3.0)
Eosinophils Absolute: 0.1 10*3/uL (ref 0.0–0.7)
Eosinophils Relative: 2.7 % (ref 0.0–5.0)
HCT: 39.6 % (ref 36.0–46.0)
Hemoglobin: 13.1 g/dL (ref 12.0–15.0)
Lymphocytes Relative: 7 % — ABNORMAL LOW (ref 12.0–46.0)
Lymphs Abs: 0.4 10*3/uL — ABNORMAL LOW (ref 0.7–4.0)
MCHC: 33.1 g/dL (ref 30.0–36.0)
MCV: 101.5 fL — ABNORMAL HIGH (ref 78.0–100.0)
Monocytes Absolute: 0.6 10*3/uL (ref 0.1–1.0)
Monocytes Relative: 10.6 % (ref 3.0–12.0)
Neutro Abs: 4.3 10*3/uL (ref 1.4–7.7)
Neutrophils Relative %: 79.1 % — ABNORMAL HIGH (ref 43.0–77.0)
Platelets: 97 10*3/uL — ABNORMAL LOW (ref 150.0–400.0)
RBC: 3.9 Mil/uL (ref 3.87–5.11)
RDW: 15 % (ref 11.5–15.5)
WBC: 5.5 10*3/uL (ref 4.0–10.5)

## 2023-12-04 LAB — COMPREHENSIVE METABOLIC PANEL
ALT: 8 U/L (ref 0–35)
AST: 17 U/L (ref 0–37)
Albumin: 3.6 g/dL (ref 3.5–5.2)
Alkaline Phosphatase: 70 U/L (ref 39–117)
BUN: 18 mg/dL (ref 6–23)
CO2: 36 meq/L — ABNORMAL HIGH (ref 19–32)
Calcium: 8.7 mg/dL (ref 8.4–10.5)
Chloride: 100 meq/L (ref 96–112)
Creatinine, Ser: 0.88 mg/dL (ref 0.40–1.20)
GFR: 62.94 mL/min (ref >60.00)
Glucose, Bld: 94 mg/dL (ref 70–99)
Potassium: 4.2 meq/L (ref 3.5–5.1)
Sodium: 143 meq/L (ref 135–145)
Total Bilirubin: 0.6 mg/dL (ref 0.2–1.2)
Total Protein: 6.6 g/dL (ref 6.0–8.3)

## 2023-12-05 ENCOUNTER — Encounter: Payer: Self-pay | Admitting: Family Medicine

## 2023-12-05 ENCOUNTER — Ambulatory Visit (INDEPENDENT_AMBULATORY_CARE_PROVIDER_SITE_OTHER): Payer: Medicare Other | Admitting: Family Medicine

## 2023-12-05 VITALS — BP 122/78 | HR 68 | Temp 98.0°F | Ht 64.0 in | Wt 150.0 lb

## 2023-12-05 DIAGNOSIS — R110 Nausea: Secondary | ICD-10-CM

## 2023-12-05 DIAGNOSIS — H938X3 Other specified disorders of ear, bilateral: Secondary | ICD-10-CM | POA: Diagnosis not present

## 2023-12-05 MED ORDER — ONDANSETRON 4 MG PO TBDP: 4.0000 mg | ORAL_TABLET | Freq: Three times a day (TID) | ORAL | 1 refills | Status: DC | PRN

## 2023-12-05 NOTE — Progress Notes (Signed)
 Ph: [phone] Fax: [phone]   Patient ID: [name], female    DOB: May 10, 1946, 78 y.o.   MRN: [id number]  This visit was conducted in person.  BP 122/78   Pulse 68   Temp 98 F (36.7 C) (Oral)   Ht 5\' 4"  (1.626 m)   Wt 150 lb (68 kg)   SpO2 97%   BMI 25.75 kg/m    CC: check ears, nausea Subjective:   HPI: [name] is a 78 y.o. female presenting on 12/05/2023 for Ear Fullness (B/l ear fullness at times )   Known SSS s/p Abbott PPM, persistent atrial fibrillation followed by cardiology on eliquis 5mg  bid for h/o mitral and aortic valve replacements. H/o rectal cancer s/p surgical and chemotherapy treatment 2012.   See recent virtual visit - seen last week with several days of nausea, anorexia. Thought possible GI illness treated with home zofran and phenergan PRN.  Labwork overall unrevealing as per below - noted chronic mild neutrophilia.   Symptoms fluctuate.  Yesterday felt well - until she took her afternoon meds after lunch.  Today took lunch meds 30-37min ago - today has not felt nauseated.  Intermittent mild L frontal headache.  No further vomiting, no constipation or diarrhea, no UTI symptoms.   Iron and fosamax still on hold.  No recent tramadol use.   Noticing intermittent "rushing: sound to both ears, not tinnitus.  No ear pain, tooth pain or sinus pressure/pain.   Saw [facility] Dr Chales Abrahams for heme-positive stool, h/o rectal cancer 2012, chronic anemia followed by heme/onc Truett Perna) s/p reassuring bone marrow biopsy 08/2023. Recent CT abd/pelvis with contrast 09/2023: rectosigmoid resection with reanastomosis, soft tissue thickening around anastomotic suture line with mesorectal and presacral soft tissue/fluid - favored post-treatment changes. 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   She continues keflex 250mg  daily for UTI PPX.      Relevant past medical, surgical, family and social history reviewed  and updated as indicated. Interim medical history since our last visit reviewed. Allergies and medications reviewed and updated. Outpatient Medications Prior to Visit  Medication Sig Dispense Refill   acetaminophen (TYLENOL) 500 MG tablet Take 2 tablets (1,000 mg total) by mouth daily. (Patient taking differently: Take 1,000 mg by mouth in the morning.)     acidophilus (RISAQUAD) CAPS capsule Take 1 capsule by mouth daily.     albuterol (VENTOLIN HFA) 108 (90 Base) MCG/ACT inhaler Inhale 2 puffs into the lungs every 6 (six) hours as needed for wheezing. 18 g 6   ascorbic acid (VITAMIN C) 500 MG tablet Take 1 tablet (500 mg total) by mouth daily.     Biotin 1000 MCG tablet Take 1,000 mcg by mouth daily.     buPROPion (WELLBUTRIN SR) 150 MG 12 hr tablet TAKE 1 TABLET(150 MG) BY MOUTH EVERY MORNING (Patient taking differently: Take 150 mg by mouth in the morning.) 90 tablet 1   calcium carbonate (OSCAL) 1500 (600 Ca) MG TABS tablet Take 600 mg of elemental calcium by mouth daily.     carboxymethylcellulose (REFRESH PLUS) 0.5 % SOLN Place 1 drop into both eyes 3 (three) times daily as needed (dry eyes).     cephALEXin (KEFLEX) 250 MG capsule Take 250 mg by mouth daily with breakfast.     cholecalciferol (VITAMIN D3) 25 MCG (1000 UNIT) tablet Take 1,000 Units by mouth daily.     docusate sodium (COLACE) 100 MG capsule Take 200 mg by mouth daily.  ELIQUIS 5 MG TABS tablet TAKE 1 TABLET(5 MG) BY MOUTH TWICE DAILY (Patient taking differently: Take 5 mg by mouth in the morning and at bedtime.) 180 tablet 1   ferrous sulfate 325 (65 FE) MG EC tablet Take 1 tablet (325 mg total) by mouth daily with breakfast.     Fluticasone-Umeclidin-Vilant (TRELEGY ELLIPTA) 200-62.5-25 MCG/ACT AEPB Inhale 1 puff into the lungs daily. 60 each 11   furosemide (LASIX) 40 MG tablet TAKE 1 TABLET BY MOUTH EVERY DAY. MAY TAKE AN EXTRA TABLET AS NEEDED FOR SWELLING (Patient taking differently: Take 40 mg by mouth in the  morning.) 135 tablet 2   gabapentin (NEURONTIN) 100 MG capsule Take 1 capsule (100 mg total) by mouth 2 (two) times daily as needed (back pain - for daytime use). (Patient taking differently: Take 100 mg by mouth daily.) 60 capsule 1   gabapentin (NEURONTIN) 300 MG capsule Take 300 mg by mouth at bedtime.     hydrocortisone (ANUSOL-HC) 2.5 % rectal cream Place 1 Application rectally 2 (two) times daily. (Patient taking differently: Place 1 Application rectally 2 (two) times daily as needed for hemorrhoids (or pain).) 30 g 0   ipratropium-albuterol (DUONEB) 0.5-2.5 (3) MG/3ML SOLN Take 3 mLs by nebulization every 6 (six) hours as needed. 360 mL 0   lisinopril (ZESTRIL) 5 MG tablet Take 1 tablet (5 mg total) by mouth daily. 90 tablet 1   lovastatin (MEVACOR) 40 MG tablet TAKE 1 TABLET(40 MG) BY MOUTH AT BEDTIME 90 tablet 2   metoprolol succinate (TOPROL XL) 25 MG 24 hr tablet Take 1 tablet (25 mg total) by mouth every evening. 90 tablet 3   montelukast (SINGULAIR) 10 MG tablet TAKE 1 TABLET(10 MG) BY MOUTH AT BEDTIME 90 tablet 1   Multiple Vitamins-Minerals (PRESERVISION AREDS 2 PO) Take 2 capsules by mouth in the morning.     omeprazole (PRILOSEC) 20 MG capsule Take 1 capsule (20 mg total) by mouth daily. 90 capsule 3   Phenazopyrid-Cranbry-C-Probiot (AZO URINARY TRACT SUPPORT PO) Take 1 capsule by mouth daily.     polyethylene glycol (MIRALAX / GLYCOLAX) packet Take 17 g by mouth daily. (Patient taking differently: Take 17 g by mouth daily as needed for mild constipation.) 14 each 0   potassium chloride (KLOR-CON) 10 MEQ tablet TAKE 2 TABLETS(20 MEQ) BY MOUTH DAILY (Patient taking differently: Take 20 mEq by mouth in the morning.) 180 tablet 1   promethazine (PHENERGAN) 12.5 MG tablet Take 1 tablet (12.5 mg total) by mouth every 8 (eight) hours as needed for nausea or vomiting.     psyllium (REGULOID) 0.52 g capsule Take 0.52 g by mouth in the morning and at bedtime.     SUFLAVE 178.7 g SOLR Take by  mouth as directed.     traMADol (ULTRAM) 50 MG tablet Take 1-2 tablets (50-100 mg total) by mouth 2 (two) times daily as needed for moderate pain (pain score 4-6).     traZODone (DESYREL) 50 MG tablet TAKE 1/2 TO 1 TABLET(25 TO 50 MG) BY MOUTH AT BEDTIME (Patient taking differently: Take 50 mg by mouth at bedtime.) 30 tablet 6   Vibegron (GEMTESA) 75 MG TABS Take 75 mg by mouth daily.     vitamin B-12 (CYANOCOBALAMIN) 500 MCG tablet Take 1 tablet (500 mcg total) by mouth 2 (two) times a week.     Zinc 50 MG CAPS Take 1 capsule (50 mg total) by mouth daily.  0   ondansetron (ZOFRAN-ODT) 4 MG disintegrating tablet Take  1 tablet (4 mg total) by mouth every 8 (eight) hours as needed for nausea or vomiting. 20 tablet 1   alendronate (FOSAMAX) 70 MG tablet Take 1 tablet (70 mg total) by mouth every 7 (seven) days. Take with a full glass of water on an empty stomach. (Patient not taking: Reported on 11/07/2023) 4 tablet 11   No facility-administered medications prior to visit.     Per HPI unless specifically indicated in ROS section below Review of Systems  Objective:  BP 122/78   Pulse 68   Temp 98 F (36.7 C) (Oral)   Ht 5\' 4"  (1.626 m)   Wt 150 lb (68 kg)   SpO2 97%   BMI 25.75 kg/m   Wt Readings from Last 3 Encounters:  12/05/23 150 lb (68 kg)  11/29/23 142 lb 8 oz (64.6 kg)  11/07/23 150 lb 6 oz (68.2 kg)      Physical Exam Vitals and nursing note reviewed.  Constitutional:      Appearance: Normal appearance. She is not ill-appearing.  HENT:     Head: Normocephalic and atraumatic.     Right Ear: Tympanic membrane, ear canal and external ear normal. There is no impacted cerumen.     Left Ear: Tympanic membrane, ear canal and external ear normal. There is no impacted cerumen.     Nose: Nose normal.     Mouth/Throat:     Mouth: Mucous membranes are moist.     Pharynx: Oropharynx is clear. No oropharyngeal exudate or posterior oropharyngeal erythema.  Eyes:     Extraocular  Movements: Extraocular movements intact.     Conjunctiva/sclera: Conjunctivae normal.     Pupils: Pupils are equal, round, and reactive to light.  Cardiovascular:     Rate and Rhythm: Normal rate and regular rhythm.     Pulses: Normal pulses.     Heart sounds: Murmur (3/6 systolic) heard.  Pulmonary:     Effort: Pulmonary effort is normal. No respiratory distress.     Breath sounds: Normal breath sounds. No wheezing, rhonchi or rales.  Abdominal:     General: Bowel sounds are normal. There is no distension.     Palpations: Abdomen is soft. There is no mass.     Tenderness: There is no abdominal tenderness. There is no guarding or rebound.     Hernia: No hernia is present.  Neurological:     Mental Status: She is alert.  Psychiatric:        Mood and Affect: Mood normal.        Behavior: Behavior normal.       Results for orders placed or performed in visit on 12/04/23  CBC with Differential/Platelet   Collection Time: 12/04/23  8:43 AM  Result Value Ref Range   WBC 5.5 4.0 - 10.5 K/uL   RBC 3.90 3.87 - 5.11 Mil/uL   Hemoglobin 13.1 12.0 - 15.0 g/dL   HCT 16.1 09.6 - 04.5 %   MCV 101.5 (H) 78.0 - 100.0 fl   MCHC 33.1 30.0 - 36.0 g/dL   RDW 40.9 81.1 - 91.4 %   Platelets 97.0 (L) 150.0 - 400.0 K/uL   Neutrophils Relative % 79.1 (H) 43.0 - 77.0 %   Lymphocytes Relative 7.0 aL (L) 12.0 - 46.0 %   Monocytes Relative 10.6 3.0 - 12.0 %   Eosinophils Relative 2.7 0.0 - 5.0 %   Basophils Relative 0.6 0.0 - 3.0 %   Neutro Abs 4.3 1.4 - 7.7 K/uL  Lymphs Abs 0.4 (L) 0.7 - 4.0 K/uL   Monocytes Absolute 0.6 0.1 - 1.0 K/uL   Eosinophils Absolute 0.1 0.0 - 0.7 K/uL   Basophils Absolute 0.0 0.0 - 0.1 K/uL  Comprehensive metabolic panel   Collection Time: 12/04/23  8:43 AM  Result Value Ref Range   Sodium 143 135 - 145 mEq/L   Potassium 4.2 3.5 - 5.1 mEq/L   Chloride 100 96 - 112 mEq/L   CO2 36 (H) 19 - 32 mEq/L   Glucose, Bld 94 70 - 99 mg/dL   BUN 18 6 - 23 mg/dL   Creatinine,  Ser 1.61 0.40 - 1.20 mg/dL   Total Bilirubin 0.6 0.2 - 1.2 mg/dL   Alkaline Phosphatase 70 39 - 117 U/L   AST 17 0 - 37 U/L   ALT 8 0 - 35 U/L   Total Protein 6.6 6.0 - 8.3 g/dL   Albumin 3.6 3.5 - 5.2 g/dL   GFR 09.60 >45.40 mL/min   Calcium 8.7 8.4 - 10.5 mg/dL   *Note: Due to a large number of results and/or encounters for the requested time period, some results have not been displayed. A complete set of results can be found in Results Review.    Assessment & Plan:   Problem List Items Addressed This Visit     Nausea - Primary   Ongoing, intermittent with reassuring labs recently.  ?med related as it seems to occur after taking meds  Continue to hold iron and fosamax Continue PRN zofran or phenergan Update if ongoing.       Ear fullness, bilateral   Reassuring exam today         Meds ordered this encounter  Medications   ondansetron (ZOFRAN-ODT) 4 MG disintegrating tablet    Sig: Take 1 tablet (4 mg total) by mouth every 8 (eight) hours as needed for nausea or vomiting.    Dispense:  20 tablet    Refill:  1    No orders of the defined types were placed in this encounter.   Patient Instructions  Continue holding iron and fosamax  Labs were overall ok  May continue phenergan or zofran as needed for nausea.  Small meals throughout the day Continue omeprazole 20mg  daily.   Follow up plan: No follow-ups on file.  [name], MD

## 2023-12-05 NOTE — Patient Instructions (Addendum)
 Continue holding iron and fosamax  Labs were overall ok  May continue phenergan or zofran as needed for nausea.  Small meals throughout the day Continue omeprazole 20mg  daily.

## 2023-12-05 NOTE — Assessment & Plan Note (Signed)
 Reassuring exam today

## 2023-12-05 NOTE — Assessment & Plan Note (Signed)
 Ongoing, intermittent with reassuring labs recently.  ?med related as it seems to occur after taking meds  Continue to hold iron and fosamax Continue PRN zofran or phenergan Update if ongoing.

## 2023-12-06 ENCOUNTER — Encounter: Payer: Self-pay | Admitting: Internal Medicine

## 2023-12-06 ENCOUNTER — Ambulatory Visit (INDEPENDENT_AMBULATORY_CARE_PROVIDER_SITE_OTHER): Payer: Medicare Other | Admitting: Internal Medicine

## 2023-12-06 VITALS — BP 124/74 | HR 71 | Ht 64.0 in | Wt 149.8 lb

## 2023-12-06 DIAGNOSIS — R110 Nausea: Secondary | ICD-10-CM

## 2023-12-06 DIAGNOSIS — R918 Other nonspecific abnormal finding of lung field: Secondary | ICD-10-CM | POA: Diagnosis not present

## 2023-12-06 DIAGNOSIS — Z2239 Carrier of other specified bacterial diseases: Secondary | ICD-10-CM | POA: Diagnosis not present

## 2023-12-06 DIAGNOSIS — J449 Chronic obstructive pulmonary disease, unspecified: Secondary | ICD-10-CM

## 2023-12-06 NOTE — Patient Instructions (Addendum)
 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  You now with severe COPD classification.  I suspect this because of the nodules and aging but at this point in time investigating the nodules adds risks. Glad 12/06/2023 thins are more stable compared to Nov 2024. Also, unclear if OTHUVAYRE is helping you  Plan  - Hold off bronchoscopy plans given recent hospitalization Nov 2024 and risk versus benefit ratio - Continue Trelegy scheduled - Continue albuterol as needed - Contnue OTHUVYARE nebulizer twice a day  -Monitor for side effects including neuropsychiatric side effects.  = let us give it another 3 months before deciiding if is really helping you   Nausea  - doubt due to [facility]  - per PCP and GI   Follow-up - 3 months with Dr. Marchelle Gearing

## 2023-12-06 NOTE — Progress Notes (Signed)
 OV 09/04/2023  Subjective:  Patient ID: [name], female , DOB: 10-21-1945 , age 78 y.o. , MRN: [id number] , ADDRESS: [address] [place] [address] PCP [name], MD Patient Care Team: [name], MD as PCP - General (Family Medicine) Thurmon Fair, MD as PCP - Cardiology (Cardiology) [name], MD as Consulting Physician (Gastroenterology) Dorothy Puffer, MD as Consulting Physician (Radiation Oncology) Laurice Record, MD as Consulting Physician (Hematology and Oncology) Susa Griffins, MD (Inactive) as Consulting Physician (Cardiology) [name], MD as Consulting Physician (Pulmonary Disease) [name], MD (General Surgery) Thurmon Fair, MD as Consulting Physician (Cardiology) Mckinley Jewel, MD as Consulting Physician (Ophthalmology) [place], Amy, MD as Consulting Physician (Dermatology) Drema Dallas, DO as Consulting Physician (Neurology)  This Provider for this visit: Treatment Team:  Attending Provider: [name], MD    09/04/2023 -   Chief Complaint  Patient presents with   Follow-up    Recent admission for heart failure. She has had increased DOE and has occ coughing spell- non prod.     HPI [name] 78 y.o. -is here for follow-up because of August 2024 CT chest showed progressive nodularity.  Radiology still concerned about MAI but 2022 bronchoscopy only showed Pseudomonas.  We deliberate about doing another bronc in case there was lung function worsening.  But at last visit the PFTs were not done.  So she has had PFTs this time.  The PFT does show decline in FEV1 [see below].  However according to history and review of the records she got admitted to the hospital between 1117/24 and also 08/30/2023 for acute heart failure with preserved ejection fraction.  Of note review of the records indicate that she underwent a CT-guided bone marrow biopsy 08/23/2023 chronic anemia.  She is  now on lisinopril for her heart failure.  They did treat her empirically for COPD exacerbation with steroids.  She was having some pleural effusions as well.  This was treated with IV Lasix I personally lysed the chest x-ray and confirmed the same findings. personally viewed.  At this point in time even though she is well.  She says she is physically deconditioned.  It appears home physical therapy is not set up for her.  She lives alone.  Her friends found her sick and brought her in.  Her granddaughter is going to come and visit with her but she lives in [place].Of note .       OV 12/06/2023  Subjective:  Patient ID: [name], female , DOB: [birth year] , age 78 y.o. , MRN: [id number] , ADDRESS: [address] [place] [address] PCP [name], MD Patient Care Team: [name], MD as PCP - General (Family Medicine) Thurmon Fair, MD as PCP - Cardiology (Cardiology) [name], MD (Inactive) as Consulting Physician (Gastroenterology) Dorothy Puffer, MD as Consulting Physician (Radiation Oncology) Laurice Record, MD as Consulting Physician (Hematology and Oncology) Susa Griffins, MD (Inactive) as Consulting Physician (Cardiology) [name], MD as Consulting Physician (Pulmonary Disease) [name], MD (General Surgery) Thurmon Fair, MD as Consulting Physician (Cardiology) Mckinley Jewel, MD as Consulting Physician (Ophthalmology) [place], Amy, MD as Consulting Physician (Dermatology) Drema Dallas, DO as Consulting Physician (Neurology)  This Provider for this visit: Treatment Team:  Attending Provider: [name], MD    Advanced COPD on Spiriva and Breo and Singulair   -Has associated right lower lobe bronchiectasis. Multiple lung nodules with waxing and waning  quality  -last CT scanApril 2023 and Nov 2022 and August 2023 and 2024  Pseudomonas bronch June 2022  - OTHUVAYRE Dec 2024/Jan  2025  COVID-19 in February 2023 in January 2024.  Strongly positive ANA in spring 2023  -Reassured rheumatology consultation with Dr. Layne Benton 2023.   12/06/2023 -   Chief Complaint  Patient presents with   Follow-up    Breathing is overall doing well. She has been dealing with nausea off and on past several wks. She has rare coughing spell brought on by "tickle in my throat".       HPI [name] 78 y.o. -returns for follow-up.  She says she has no issues with the breathing she feels her breathing is stable.  Last visit we started her on OTHUVAYRE but she states she is only takes it as needed because she forgets.  Nevertheless she started this around a month ago.  She is willing sure if she is any better but then 3 weeks ago she started with significant nausea but it still kind of cyclical.  It was really bad then got better and now getting worse again.  She seen primary care recent lab work is normal.  Etiology not known.  We reviewed the side effects of the nebulizer and nausea is not one of them.  She does not think it is from the new nebulizer either.  At this point in time she is content with the status quo on her lungs she just wants to get her nausea workup done.  This being done by primary care.  It also appears in April she has a GI endoscopy or something pending.     Simple office walk 185 feet x  3 laps goal with forehead probe 12/02/2022    O2 used ra   Number laps completed 2 of 3   Comments about pace slow   Resting Pulse Ox/HR 97% and 87/min   Final Pulse Ox/HR 95% and 97/min   Desaturated </= 88% no   Desaturated <= 3% points no   Got Tachycardic >/= 90/min yes   Symptoms at end of test bACK pain and stopped   Miscellaneous comments x    PFT     Latest Ref Rng & Units 09/01/2023   10:48 AM 03/13/2018   10:36 AM 04/01/2016   10:10 AM 11/29/2013    4:04 PM  PFT Results  FVC-Pre L 1.45  2.21  2.22  1.49   FVC-Predicted Pre % 53  70  68  42    FVC-Post L   2.33  1.60   FVC-Predicted Post %   72  45   Pre FEV1/FVC % % 69  64  61  63   Post FEV1/FCV % %   63  65   FEV1-Pre L 1.00  1.42  1.35  0.93   FEV1-Predicted Pre % 49  59  55  34   FEV1-Post L   1.47  1.05   DLCO uncorrected ml/min/mmHg 13.37  24.55  19.21    DLCO UNC% % 70  91  71    DLCO corrected ml/min/mmHg 14.70  22.70     DLCO COR %Predicted % 77  84     DLVA Predicted % 132  102  95    TLC L   5.00    TLC % Predicted %   93    RV % Predicted %   121       Latest Reference Range &  Units 12/31/07 16:04 02/04/08 16:37 05/18/11 15:19 05/27/11 11:21 06/08/11 10:56 06/22/11 13:26 07/07/11 08:41 08/10/11 11:20 08/18/11 13:20 08/27/11 03:20 09/21/11 14:05 10/21/11 09:00 11/07/11 13:15 11/09/11 18:36 12/16/11 08:43 01/10/12 09:40 01/27/12 14:39 02/17/12 08:56 03/15/12 09:23 07/16/12 09:02 11/15/12 09:08 07/05/13 08:50 08/28/13 22:05 10/10/13 11:27 11/25/13 22:10 11/24/14 15:11 01/13/15 11:03 02/25/15 10:08 10/23/15 09:03 06/15/16 09:45 11/07/16 11:06 08/22/17 11:58 11/15/17 09:48 01/22/18 12:28 05/28/18 09:41 07/22/19 09:17 10/31/19 08:41 03/10/20 17:29 06/05/20 08:09 06/09/21 08:14 01/03/22 13:31 03/21/22 08:21 06/22/22 08:04 05/07/23 09:49 06/21/23 07:29 08/01/23 08:35 08/17/23 13:04 08/23/23 06:49 08/27/23 10:52 09/06/23 09:34 09/25/23 11:45 12/04/23 08:43  Eosinophils Absolute 0.0 - 0.7 K/uL 0.1 0.0 0.2 0.2 0.2 0.4 0.5 0.2 0.2 0.1 0.2 0.1 0.1 0.1 0.2 0.1 0.1 0.1 0.2 0.2 0.2 0.2 0.0 0.0 0.1 0.1 0.1 0.1 0.2 0.2 0.1 0.0 0.1 0.1 0.1 0.1 0.1 0.0 0.2 0.2 0.1 0.2 0.1 0.2 0.2 0.2 0.2 0.3 0.2 0.3 0.2 0.1     LAB RESULTS last 96 hours No results found.       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 (12/31/2016), Acute cystitis without hematuria (12/22/2015), Acute right-sided low back pain without sciatica (11/26/2019), Adjustment disorder with depressed mood (10/15/2014), Anemia, Anticoagulant long-term use (06/14/2019), Aortic atherosclerosis  (03/03/2022), Arthritis, Arthropathy of spinal facet joint (03/08/2018), Ascending aortic aneurysm (03/02/2021), Bilateral hip pain (09/08/2016), Cancer (HCC), CAP (community acquired pneumonia) (02/08/2016), Carotid stenosis (12/08/2020), Cataracts, bilateral, Chest pain (08/25/2014), Chronic cholecystitis with calculus (01/21/2015), Chronic diastolic heart failure (09/19/2014), Chronic insomnia, Chronic lower back pain (03/05/2008), CKD (chronic kidney disease) stage 2, GFR 60-89 ml/min (07/03/2015), Constipation, COPD (chronic obstructive pulmonary disease), COVID-19 virus infection (11/11/2022), DDD (degenerative disc disease), Depression, DISH (diffuse idiopathic skeletal hyperostosis) (05/14/2019), Dyspnea on exertion (06/05/2015), Edema of both lower legs due to peripheral venous insufficiency, Essential hypertension, benign (03/24/2009), Heart murmur, Hematuria (06/17/2021), History of kidney stones, History of radiation therapy (05/30/11 to 07/07/11), History of rectal cancer (05/26/2011), History of shingles, History of vertebral fracture (11/26/2019), Hyperlipemia, Lacunar infarction, Left hip pain (07/29/2020), Legally blind in left eye, 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), Macrocytosis (03/27/2022), Malaise and fatigue (01/22/2018), Meralgia paresthetica (12/13/2007), 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), Obesity, Osteopenia (12/2014), Other spondylosis with radiculopathy, lumbar region (07/17/2018), Pain of right sacroiliac joint (08/09/2017), Pancreatitis (11/2014), Peripheral neuropathy (01/03/2022), Persistent atrial fibrillation (12/09/2020), Personal history of colonic polyps, PONV (postoperative nausea and vomiting), Positive occult stool blood test, Presence of permanent cardiac pacemaker, 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), 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), Pulmonary artery hypertension (06/17/2021), Pulmonary infiltrates (04/23/2019), Pulmonary nodule  (02/23/2012), Rheumatic heart disease mitral stenosis, Right leg swelling (06/11/2020), 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   reports that she quit smoking about 48 years ago. Her smoking use included cigarettes. She started smoking about 58 years ago. She has a 15 pack-year smoking history. She has never used smokeless tobacco.  Past Surgical History:  Procedure Laterality Date   ANTERIOR LAT LUMBAR FUSION Left 07/17/2018   Procedure: Lumbar Two-Three Lumbar Three-Four Anterolateral decompression/interbody fusion with lateral plate fixation/Infuse;  Surgeon: Barnett Abu, MD;  Location: [facility];  Service: Neurosurgery;  Laterality: Left;  Lumbar Two-Three  Lumbar Three-Four Anterolateral decompression/interbody fusion with lateral plate fixation/Infuse   AORTIC VALVE REPLACEMENT N/A 12/12/2013   Procedure: AORTIC VALVE REPLACEMENT (AVR);  Surgeon: Alleen Borne, MD; Service: Open Heart Surgery   BACK SURGERY  2006, 2007   2006 SPACER, 2007 decompression and fusion L4/5   BOWEL RESECTION  04/02/2012   Procedure: SMALL BOWEL RESECTION;  Surgeon: [name], MD;  Location: [facility]S;  Service: General;  Laterality: N/A;   BRONCHIAL WASHINGS  03/24/2021   Procedure: BRONCHIAL WASHINGS;  Surgeon: [name], MD;  Location: [facility] ENDOSCOPY;  Service: Endoscopy;;   CARDIOVERSION N/A 08/29/2023   Procedure: CARDIOVERSION (CATH LAB);  Surgeon: Lewayne Bunting, MD;  Location: [facility] INVASIVE CV LAB;  Service:  Cardiovascular;  Laterality: N/A;   CARPAL TUNNEL RELEASE Bilateral    CATARACT EXTRACTION W/ INTRAOCULAR LENS IMPLANT Left 10/18/2016   CATARACT EXTRACTION W/ INTRAOCULAR LENS IMPLANT Right 12/13/2016   Dr. Dagoberto Ligas   CHOLECYSTECTOMY N/A 01/21/2015   chronic cholecystitis, [name], MD   COLON RESECTION  08/25/2011   Procedure: COLON RESECTION LAPAROSCOPIC;  Surgeon: [name], MD;  Location: [facility];  Service: General;  Laterality: N/A;  Laparoscopic Assisted Low Anterior Resection Diverting Ostomy and onQ pain pump   COLONOSCOPY  03/2013   1 polyp, rpt 3 yrs Christella Hartigan)   COLONOSCOPY  05/2021   diverticulosis and patent colon anastomosis with some edema/narrowing, rpt 5 yrs Christella Hartigan)   COLONOSCOPY WITH PROPOFOL N/A 06/09/2016   patent colo-colonic anastomosis, rpt 5 yrs Christella Hartigan)   CYSTOSCOPY/URETEROSCOPY/HOLMIUM LASER/STENT PLACEMENT Right 07/03/2023   Procedure: CYSTOSCOPY RIGHT RETROGRADE PYEPLGRAM RIGHT URETEROSCOPY/HOLMIUM LASER/STENT PLACEMENT;  Surgeon: Crista Elliot, MD;  Location: [facility];  Service: Urology;  Laterality: Right;  1 HR FOR CASE   CYSTOSCOPY/URETEROSCOPY/HOLMIUM LASER/STENT PLACEMENT Right 07/17/2023   Procedure: CYSTOSCOPY/RIGHT URETEROSCOPY/STENT EXCHANGE;  Surgeon: Crista Elliot, MD;  Location: [facility];  Service: Urology;  Laterality: Right;  60 MINS FOR CASE   EP IMPLANTABLE DEVICE N/A 06/16/2016   Procedure: Pacemaker Implant;  Surgeon: Will Jorja Loa, MD;  Location: [facility] INVASIVE CV LAB;  Service: Cardiovascular;  Laterality: N/A;   FOOT SURGERY  left foot   hammer toe   ILEOSTOMY  08/25/2011   ILEOSTOMY CLOSURE  04/02/2012   Procedure: ILEOSTOMY TAKEDOWN;  Surgeon: [name], MD;  Location: [facility];  Service: General;  Laterality: N/A;   INTRAOPERATIVE TRANSESOPHAGEAL ECHOCARDIOGRAM N/A 12/12/2013   Procedure: INTRAOPERATIVE TRANSESOPHAGEAL ECHOCARDIOGRAM;  Surgeon: Alleen Borne, MD;  Location: [facility];  Service: Open Heart Surgery;   Laterality: N/A;   LEFT AND RIGHT HEART CATHETERIZATION WITH CORONARY ANGIOGRAM N/A 11/27/2013   Procedure: LEFT AND RIGHT HEART CATHETERIZATION WITH CORONARY ANGIOGRAM;  Surgeon: Thurmon Fair, MD;  Location: [facility] CATH LAB;  Service: Cardiovascular;  Laterality: N/A;   MITRAL VALVE REPLACEMENT N/A 12/12/2013   Procedure: MITRAL VALVE (MV) REPLACEMENT OR REPAIR;  Surgeon: Alleen Borne, MD; Service: Open Heart Surgery   TEE WITHOUT CARDIOVERSION N/A 11/29/2013   Procedure: TRANSESOPHAGEAL ECHOCARDIOGRAM (TEE);  Surgeon: Quintella Reichert, MD;  Location[facility] ENDOSCOPY;  Service: Cardiovascular;  Laterality: N/A;   TEE WITHOUT CARDIOVERSION N/A 06/04/2019   Procedure: TRANSESOPHAGEAL ECHOCARDIOGRAM (TEE);  Surgeon: Thurmon Fair, MD;  Location: [facility] ENDOSCOPY;  Service: Cardiovascular;  Laterality: N/A;   TONSILLECTOMY     TRANSESOPHAGEAL ECHOCARDIOGRAM (CATH LAB) N/A 08/29/2023   Procedure: TRANSESOPHAGEAL ECHOCARDIOGRAM;  Surgeon: Lewayne Bunting, MD;  Location: [facility] INVASIVE CV LAB;  Service: Cardiovascular;  Laterality: N/A;   VIDEO BRONCHOSCOPY  N/A 03/24/2021   Procedure: VIDEO BRONCHOSCOPY WITHOUT FLUORO;  Surgeon: [name], MD;  Location: [facility] ENDOS[facility];  Service: Endoscopy;  Laterality: N/A;  SMALL SCOPE,PREMEDICATE WITH NEBULIZED LIDOCAINE    Allergies  Allergen Reactions   Tape Other (See Comments)    "THICK TAPES PULLS OFF MY SKIN"   Codeine Nausea Only   Prednisone Nausea Only   Sulfa Antibiotics Nausea Only    Immunization History  Administered Date(s) Administered   Fluad Quad(high Dose 65+) 07/11/2019, 07/14/2020, 07/25/2022   Fluad Trivalent(High Dose 65+) 08/30/2023   H1N1 09/17/2008   Influenza Split 07/18/2012   Influenza Whole 08/10/2005, 06/24/2009, 07/11/2011   Influenza, High Dose Seasonal PF 07/04/2016, 07/04/2017, 07/27/2021   Influenza,inj,Quad PF,6+ Mos 07/09/2018   Influenza-Unspecified 07/24/2013, 06/30/2014, 06/30/2015   Moderna Covid-19 Vaccine  Bivalent Booster 76yrs & up 07/02/2021, 07/24/2023   Moderna SARS-COV2 Booster Vaccination 08/14/2020   Moderna Sars-Covid-2 Vaccination 02/26/2021   PFIZER(Purple Top)SARS-COV-2 Vaccination 10/30/2019, 11/20/2019   Pfizer(Comirnaty)Fall Seasonal Vaccine 12 years and older 08/15/2022   Pneumococcal Conjugate-13 06/02/2014   Pneumococcal Polysaccharide-23 08/10/2005, 04/21/2008, 06/25/2013   Respiratory Syncytial Virus Vaccine,Recomb Aduvanted(Arexvy) 07/13/2022   Td 10/11/2005   Zoster Recombinant(Shingrix) 06/25/2018, 08/28/2018   Zoster, Live 09/16/2014    Family History  Problem Relation Age of Onset   Heart disease Mother    Diabetes Mother    Hypertension Mother    Stroke Mother    Lung cancer Father    Breast cancer Maternal Aunt 32   Heart attack Maternal Grandfather    CAD Son 53   Heart attack Son 23   Rectal cancer Neg Hx    Stomach cancer Neg Hx    Esophageal cancer Neg Hx    Colon cancer Neg Hx      Current Outpatient Medications:    acetaminophen (TYLENOL) 500 MG tablet, Take 2 tablets (1,000 mg total) by mouth daily. (Patient taking differently: Take 1,000 mg by mouth in the morning.), Disp: , Rfl:    acidophilus (RISAQUAD) CAPS capsule, Take 1 capsule by mouth daily., Disp: , Rfl:    albuterol (VENTOLIN HFA) 108 (90 Base) MCG/ACT inhaler, Inhale 2 puffs into the lungs every 6 (six) hours as needed for wheezing., Disp: 18 g, Rfl: 6   ascorbic acid (VITAMIN C) 500 MG tablet, Take 1 tablet (500 mg total) by mouth daily., Disp: , Rfl:    Biotin 1000 MCG tablet, Take 1,000 mcg by mouth daily., Disp: , Rfl:    buPROPion (WELLBUTRIN SR) 150 MG 12 hr tablet, TAKE 1 TABLET(150 MG) BY MOUTH EVERY MORNING (Patient taking differently: Take 150 mg by mouth in the morning.), Disp: 90 tablet, Rfl: 1   calcium carbonate (OSCAL) 1500 (600 Ca) MG TABS tablet, Take 600 mg of elemental calcium by mouth daily., Disp: , Rfl:    carboxymethylcellulose (REFRESH PLUS) 0.5 % SOLN, Place 1  drop into both eyes 3 (three) times daily as needed (dry eyes)., Disp: , Rfl:    cephALEXin (KEFLEX) 250 MG capsule, Take 250 mg by mouth daily with breakfast., Disp: , Rfl:    cholecalciferol (VITAMIN D3) 25 MCG (1000 UNIT) tablet, Take 1,000 Units by mouth daily., Disp: , Rfl:    docusate sodium (COLACE) 100 MG capsule, Take 200 mg by mouth daily., Disp: , Rfl:    ELIQUIS 5 MG TABS tablet, TAKE 1 TABLET(5 MG) BY MOUTH TWICE DAILY (Patient taking differently: Take 5 mg by mouth in the morning and at bedtime.), Disp: 180 tablet, Rfl: 1  Ensifentrine (OHTUVAYRE) 3 MG/2.5ML SUSP, Inhale into the lungs. Twice daily via neb, Disp: , Rfl:    ferrous sulfate 325 (65 FE) MG EC tablet, Take 1 tablet (325 mg total) by mouth daily with breakfast., Disp: , Rfl:    Fluticasone-Umeclidin-Vilant (TRELEGY ELLIPTA) 200-62.5-25 MCG/ACT AEPB, Inhale 1 puff into the lungs daily., Disp: 60 each, Rfl: 11   furosemide (LASIX) 40 MG tablet, TAKE 1 TABLET BY MOUTH EVERY DAY. MAY TAKE AN EXTRA TABLET AS NEEDED FOR SWELLING (Patient taking differently: Take 40 mg by mouth in the morning.), Disp: 135 tablet, Rfl: 2   gabapentin (NEURONTIN) 100 MG capsule, Take 1 capsule (100 mg total) by mouth 2 (two) times daily as needed (back pain - for daytime use). (Patient taking differently: Take 100 mg by mouth daily.), Disp: 60 capsule, Rfl: 1   gabapentin (NEURONTIN) 300 MG capsule, Take 300 mg by mouth at bedtime., Disp: , Rfl:    hydrocortisone (ANUSOL-HC) 2.5 % rectal cream, Place 1 Application rectally 2 (two) times daily. (Patient taking differently: Place 1 Application rectally 2 (two) times daily as needed for hemorrhoids (or pain).), Disp: 30 g, Rfl: 0   ipratropium-albuterol (DUONEB) 0.5-2.5 (3) MG/3ML SOLN, Take 3 mLs by nebulization every 6 (six) hours as needed., Disp: 360 mL, Rfl: 0   lisinopril (ZESTRIL) 5 MG tablet, Take 1 tablet (5 mg total) by mouth daily., Disp: 90 tablet, Rfl: 1   lovastatin (MEVACOR) 40 MG tablet,  TAKE 1 TABLET(40 MG) BY MOUTH AT BEDTIME, Disp: 90 tablet, Rfl: 2   metoprolol succinate (TOPROL XL) 25 MG 24 hr tablet, Take 1 tablet (25 mg total) by mouth every evening., Disp: 90 tablet, Rfl: 3   montelukast (SINGULAIR) 10 MG tablet, TAKE 1 TABLET(10 MG) BY MOUTH AT BEDTIME, Disp: 90 tablet, Rfl: 1   Multiple Vitamins-Minerals (PRESERVISION AREDS 2 PO), Take 2 capsules by mouth in the morning., Disp: , Rfl:    omeprazole (PRILOSEC) 20 MG capsule, Take 1 capsule (20 mg total) by mouth daily., Disp: 90 capsule, Rfl: 3   ondansetron (ZOFRAN-ODT) 4 MG disintegrating tablet, Take 1 tablet (4 mg total) by mouth every 8 (eight) hours as needed for nausea or vomiting., Disp: 20 tablet, Rfl: 1   Phenazopyrid-Cranbry-C-Probiot (AZO URINARY TRACT SUPPORT PO), Take 1 capsule by mouth daily., Disp: , Rfl:    polyethylene glycol (MIRALAX / GLYCOLAX) packet, Take 17 g by mouth daily. (Patient taking differently: Take 17 g by mouth daily as needed for mild constipation.), Disp: 14 each, Rfl: 0   potassium chloride (KLOR-CON) 10 MEQ tablet, TAKE 2 TABLETS(20 MEQ) BY MOUTH DAILY (Patient taking differently: Take 20 mEq by mouth in the morning.), Disp: 180 tablet, Rfl: 1   promethazine (PHENERGAN) 12.5 MG tablet, Take 1 tablet (12.5 mg total) by mouth every 8 (eight) hours as needed for nausea or vomiting., Disp: , Rfl:    psyllium (REGULOID) 0.52 g capsule, Take 0.52 g by mouth in the morning and at bedtime., Disp: , Rfl:    SUFLAVE 178.7 g SOLR, Take by mouth as directed., Disp: , Rfl:    traMADol (ULTRAM) 50 MG tablet, Take 1-2 tablets (50-100 mg total) by mouth 2 (two) times daily as needed for moderate pain (pain score 4-6)., Disp: , Rfl:    traZODone (DESYREL) 50 MG tablet, TAKE 1/2 TO 1 TABLET(25 TO 50 MG) BY MOUTH AT BEDTIME (Patient taking differently: Take 50 mg by mouth at bedtime.), Disp: 30 tablet, Rfl: 6   Vibegron (GEMTESA) 75  MG TABS, Take 75 mg by mouth daily., Disp: , Rfl:    vitamin B-12  (CYANOCOBALAMIN) 500 MCG tablet, Take 1 tablet (500 mcg total) by mouth 2 (two) times a week., Disp: , Rfl:    Zinc 50 MG CAPS, Take 1 capsule (50 mg total) by mouth daily., Disp: , Rfl: 0      Objective:   Vitals:   12/06/23 1304  BP: 124/74  Pulse: 71  SpO2: 97%  Weight: 149 lb 12.8 oz (67.9 kg)  Height: 5\' 4"  (1.626 m)    Estimated body mass index is 25.71 kg/m as calculated from the following:   Height as of this encounter: 5\' 4"  (1.626 m).   Weight as of this encounter: 149 lb 12.8 oz (67.9 kg).  @WEIGHTCHANGE @  [employer]   12/06/23 1304  Weight: 149 lb 12.8 oz (67.9 kg)     Physical Exam   General: No distress.  Looks better.  Somewhat frail and cachectic. O2 at rest: no Cane present: no Sitting in wheel chair: no Frail: nyes Obese: no Neuro: Alert and Oriented x 3. GCS 15. Speech normal Psych: Pleasant Resp:  Barrel Chest - no.  Wheeze - no, Crackles - yes scattered, No overt respiratory distress CVS: Normal heart sounds. Murmurs - no Ext: Stigmata of Connective Tissue Disease - no HEENT: Normal upper airway. PEERL +. No post nasal drip        Assessment:       ICD-10-CM   1. Stage 3 severe COPD by GOLD classification (HCC)  J44.9     2. Multiple lung nodules on CT  R91.8     3. Pseudomonas aeruginosa colonization  Z22.39     4. Nausea  R11.0          Plan:     Patient Instructions  Stage 3 severe COPD by GOLD classification (HCC) Multiple lung nodules on CT Pseudomonas aeruginosa colonization   -Lung function has deteriorated in the last 5 years.  You now with severe COPD classification.  I suspect this because of the nodules and aging but at this point in time investigating the nodules adds risks. Glad 12/06/2023 thins are more stable compared to Nov 2024. Also, unclear if OTHUVAYRE is helping you  Plan  - Hold off bronchoscopy plans given recent hospitalization Nov 2024 and risk versus benefit ratio - Continue Trelegy scheduled -  Continue albuterol as needed - Contnue OTHUVYARE nebulizer twice a day  -Monitor for side effects including neuropsychiatric side effects.  = let us give it another 3 months before deciiding if is really helping you   Nausea  - doubt due to [facility]  - per PCP and GI   Follow-up - 3 months with Dr. Sherrin Daisy Return in about 3 months (around 03/04/2024) for 15 min visit, with Dr Marchelle Gearing, Face to Face Visit, copd.    SIGNATURE    Dr. [name], [name], F.C.C.P,  Pulmonary and Critical Care Medicine Staff Physician, [facility] Director - Interstitial Lung Disease  Program  Pulmonary Fibrosis [facility] at [facility] [place], [place], [address]  Pager: [phone], If no answer or between  15:00h - 7:00h: call [phone] Telephone: [phone]  1:41 PM 12/06/2023

## 2023-12-07 ENCOUNTER — Telehealth: Payer: Self-pay

## 2023-12-07 NOTE — Telephone Encounter (Signed)
 Has zofran or phenergan helped at all?  Is she having any shortness of breath or new leg swelling?  Recommend hold all supplements at this time. Recommend hold OHTUVAYRE nebulizer (newest med).  Would also hold omeprazole at this time.  Update [place] with effect of above.

## 2023-12-07 NOTE — Telephone Encounter (Addendum)
 Would suggest 1/2 tablet phenergan 12.5mg  at a time

## 2023-12-07 NOTE — Telephone Encounter (Signed)
 Patient sates when she takes Promethazine it helps with symptoms but makes her very sleepy. She has not picked up the zofran yet.  Denies any sob or swelling in legs  Patient will hold all medications as requested and will reach out with how she is feeling in a few days.

## 2023-12-07 NOTE — Telephone Encounter (Signed)
Spoke with pt relaying Dr. G's message. Pt verbalizes understanding.  

## 2023-12-07 NOTE — Telephone Encounter (Signed)
 Copied from CRM [id number]. Topic: Clinical - Medical Advice >> Dec 07, 2023 11:36 AM Alcus Dad wrote: Reason for CRM: Patient has had severe nausea and doesn't feel well. Patient stated that she is not getting any better

## 2023-12-26 ENCOUNTER — Other Ambulatory Visit: Payer: Self-pay | Admitting: Family Medicine

## 2023-12-26 DIAGNOSIS — M545 Low back pain, unspecified: Secondary | ICD-10-CM

## 2023-12-26 NOTE — Telephone Encounter (Signed)
 Name of Medication:  Tramadol Name of Pharmacy:  [facility] [address] Last Fill or Written Date and Quantity:  10/24/23, #60 Last Office Visit and Type:  12/05/23, ear fullness Next Office Visit and Type:  02/05/24, 3 mo f/u Last Controlled Substance Agreement Date:  none Last UDS:  none  Hydrocortisone rectal crm last filled:  04/24/23, #30 g

## 2023-12-27 NOTE — Telephone Encounter (Signed)
 ERx

## 2024-01-05 ENCOUNTER — Inpatient Hospital Stay: Payer: Medicare Other | Attending: Oncology

## 2024-01-05 ENCOUNTER — Inpatient Hospital Stay: Payer: Medicare Other | Admitting: Oncology

## 2024-01-05 VITALS — BP 121/77 | HR 98 | Temp 98.1°F | Resp 18 | Ht 64.0 in | Wt 145.9 lb

## 2024-01-05 DIAGNOSIS — D539 Nutritional anemia, unspecified: Secondary | ICD-10-CM | POA: Insufficient documentation

## 2024-01-05 DIAGNOSIS — Z85048 Personal history of other malignant neoplasm of rectum, rectosigmoid junction, and anus: Secondary | ICD-10-CM | POA: Insufficient documentation

## 2024-01-05 DIAGNOSIS — D696 Thrombocytopenia, unspecified: Secondary | ICD-10-CM | POA: Diagnosis not present

## 2024-01-05 LAB — CBC WITH DIFFERENTIAL (CANCER CENTER ONLY)
Abs Immature Granulocytes: 0.03 10*3/uL (ref 0.00–0.07)
Basophils Absolute: 0 10*3/uL (ref 0.0–0.1)
Basophils Relative: 0 %
Eosinophils Absolute: 0.2 10*3/uL (ref 0.0–0.5)
Eosinophils Relative: 3 %
HCT: 42.5 % (ref 36.0–46.0)
Hemoglobin: 13.4 g/dL (ref 12.0–15.0)
Immature Granulocytes: 0 %
Lymphocytes Relative: 4 %
Lymphs Abs: 0.4 10*3/uL — ABNORMAL LOW (ref 0.7–4.0)
MCH: 32.9 pg (ref 26.0–34.0)
MCHC: 31.5 g/dL (ref 30.0–36.0)
MCV: 104.4 fL — ABNORMAL HIGH (ref 80.0–100.0)
Monocytes Absolute: 0.7 10*3/uL (ref 0.1–1.0)
Monocytes Relative: 9 %
Neutro Abs: 6.7 10*3/uL (ref 1.7–7.7)
Neutrophils Relative %: 84 %
Platelet Count: 75 10*3/uL — ABNORMAL LOW (ref 150–400)
RBC: 4.07 MIL/uL (ref 3.87–5.11)
RDW: 14.8 % (ref 11.5–15.5)
WBC Count: 8.1 10*3/uL (ref 4.0–10.5)
nRBC: 0 % (ref 0.0–0.2)

## 2024-01-05 LAB — FERRITIN: Ferritin: 75 ng/mL (ref 11–307)

## 2024-01-05 NOTE — Progress Notes (Signed)
  [facility] OFFICE PROGRESS NOTE   Diagnosis: Anemia/thrombocytopenia  INTERVAL HISTORY:   Ms. Zinn returns as scheduled.  She feels well.  She reports 1 recent nosebleed.  She bruises easily at the forearms and lower legs.  No other bleeding.  She is taking iron.  Objective:  Vital signs in last 24 hours:  Blood pressure 121/77, pulse 98, temperature 98.1 F (36.7 C), resp. rate 18, height 5\' 4"  (1.626 m), weight 145 lb 14.4 oz (66.2 kg), SpO2 98%.    Lymphatics: No cervical, supraclavicular, axillary, or inguinal nodes Resp: Bilateral end expiratory wheeze and end inspiratory rhonchi at the lower posterior chest Cardio: Regular rate and rhythm GI: No hepatosplenomegaly Vascular: The right lower leg is larger than the left side, no edema  Skin: Multiple ecchymoses at the forearms and lower legs   Lab Results:  Lab Results  Component Value Date   WBC 8.1 01/05/2024   HGB 13.4 01/05/2024   HCT 42.5 01/05/2024   MCV 104.4 (H) 01/05/2024   PLT 75 (L) 01/05/2024   NEUTROABS 6.7 01/05/2024    CMP  Lab Results  Component Value Date   NA 143 12/04/2023   K 4.2 12/04/2023   CL 100 12/04/2023   CO2 36 (H) 12/04/2023   GLUCOSE 94 12/04/2023   BUN 18 12/04/2023   CREATININE 0.88 12/04/2023   CALCIUM 8.7 12/04/2023   PROT 6.6 12/04/2023   ALBUMIN 3.6 12/04/2023   AST 17 12/04/2023   ALT 8 12/04/2023   ALKPHOS 70 12/04/2023   BILITOT 0.6 12/04/2023   GFRNONAA >60 08/30/2023   GFRAA 82 05/31/2019    Lab Results  Component Value Date   CEA1 <1.00 08/12/2016   CEA1 1.0 08/12/2016   CEA <2.0 09/25/2023     Medications: I have reviewed the patient's current medications.   Assessment/Plan: Macrocytic anemia Bone marrow biopsy 08/23/2023-variably cellular marrow, no dyspoiesis or increase in blast cells, decreased storage iron, normal cytogenetics, negative MDS FISH panel  Thrombocytopenia  3.  COPD 4.  Aortic valve replacement 2015 5.   Mitral valve replacement 2015 6.  Kidney stone 2024 7.  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  Arrhythmia, pacemaker in place 9.  Chronic venous stasis with lower extremity ulcers 10.  Admission with heart failure, atrial fibrillation/flutter, and COPD exacerbation 08/27/2023: Cardioversion performed for 19 2024 11.  Frequent UTIs      Disposition: Ms. Deyo has chronic mild anemia and thrombocytopenia.  The etiology of the cytopenias is unclear.  A bone marrow biopsy in November 2024 was nondiagnostic, though she may have early myelodysplasia.  A CT abdomen/pelvis on 09/29/2023 did not reveal changes of cirrhosis.  She reports she is being scheduled for colonoscopy by Dr. Chales Abrahams in April.  She will call for spontaneous bleeding or bruising.  Mr. Eisenstein will return for an office and lab visit in 4 months.  Thornton Papas, MD  01/05/2024  12:13 PM

## 2024-01-15 ENCOUNTER — Other Ambulatory Visit: Payer: Self-pay | Admitting: Family Medicine

## 2024-01-15 DIAGNOSIS — F5104 Psychophysiologic insomnia: Secondary | ICD-10-CM

## 2024-01-17 ENCOUNTER — Telehealth: Payer: Self-pay | Admitting: Gastroenterology

## 2024-01-17 ENCOUNTER — Encounter (HOSPITAL_COMMUNITY): Payer: Self-pay | Admitting: Gastroenterology

## 2024-01-17 NOTE — Telephone Encounter (Signed)
 Procedure:Endoscopy/colonoscopy Procedure date: 01/25/24 Procedure location: [facility] Arrival Time: 6:30 am Spoke with the patient Y/N: Yes Any prep concerns? No  Has the patient obtained the prep from the pharmacy ? Yes Do you have a care partner and transportation: Yes Any additional concerns? No

## 2024-01-18 NOTE — Progress Notes (Signed)
 Sanna Senaida Ores MD  Cardiologist-Croitoru MD PulmonologistMarchelle Gearing MD   EKG-09/11/23 Echo-08/29/23 Cath-n/a Stress-2019 ICD/PM-PM-STJ Blood thinner-Eliquis instructed to hold 2 days GLP-1-n/a   Hx: Second degree AV block, Murmurm HTN, AAA, Aortic valve replacement, COPD, CKD, Colon cancer, Pacemaker.  Patient last saw cardiologist 10/24/23. also had a pre op clearance for a spinal injection, which she states went fine. Also sees pulmonary, last seen 12/06/23 for severe COPD, pt denies any new breathing issues, not on 02.    Anesthesia Review- Yes.- Stable, well followed, okay to proceed.

## 2024-01-25 ENCOUNTER — Ambulatory Visit (HOSPITAL_COMMUNITY)

## 2024-01-25 ENCOUNTER — Ambulatory Visit (HOSPITAL_COMMUNITY)
Admission: RE | Admit: 2024-01-25 | Discharge: 2024-01-25 | Disposition: A | Discharge status: Home / Self Care | Payer: Medicare Other | Attending: Gastroenterology | Admitting: Gastroenterology

## 2024-01-25 ENCOUNTER — Encounter (HOSPITAL_COMMUNITY)
Admission: RE | Disposition: A | Discharge status: Home / Self Care | Payer: Self-pay | Source: Home / Self Care | Attending: Gastroenterology

## 2024-01-25 ENCOUNTER — Encounter (HOSPITAL_COMMUNITY): Payer: Self-pay | Admitting: Gastroenterology

## 2024-01-25 ENCOUNTER — Other Ambulatory Visit: Payer: Self-pay

## 2024-01-25 DIAGNOSIS — K5730 Diverticulosis of large intestine without perforation or abscess without bleeding: Secondary | ICD-10-CM | POA: Insufficient documentation

## 2024-01-25 DIAGNOSIS — K319 Disease of stomach and duodenum, unspecified: Secondary | ICD-10-CM | POA: Insufficient documentation

## 2024-01-25 DIAGNOSIS — Z923 Personal history of irradiation: Secondary | ICD-10-CM | POA: Insufficient documentation

## 2024-01-25 DIAGNOSIS — K648 Other hemorrhoids: Secondary | ICD-10-CM

## 2024-01-25 DIAGNOSIS — Z980 Intestinal bypass and anastomosis status: Secondary | ICD-10-CM | POA: Insufficient documentation

## 2024-01-25 DIAGNOSIS — K2970 Gastritis, unspecified, without bleeding: Secondary | ICD-10-CM | POA: Diagnosis not present

## 2024-01-25 DIAGNOSIS — R195 Other fecal abnormalities: Secondary | ICD-10-CM | POA: Insufficient documentation

## 2024-01-25 DIAGNOSIS — J449 Chronic obstructive pulmonary disease, unspecified: Secondary | ICD-10-CM | POA: Insufficient documentation

## 2024-01-25 DIAGNOSIS — Z7901 Long term (current) use of anticoagulants: Secondary | ICD-10-CM | POA: Insufficient documentation

## 2024-01-25 DIAGNOSIS — D123 Benign neoplasm of transverse colon: Secondary | ICD-10-CM | POA: Insufficient documentation

## 2024-01-25 DIAGNOSIS — Z87891 Personal history of nicotine dependence: Secondary | ICD-10-CM | POA: Diagnosis not present

## 2024-01-25 DIAGNOSIS — I700 Atherosclerosis of aorta: Secondary | ICD-10-CM | POA: Insufficient documentation

## 2024-01-25 DIAGNOSIS — K219 Gastro-esophageal reflux disease without esophagitis: Secondary | ICD-10-CM | POA: Diagnosis not present

## 2024-01-25 DIAGNOSIS — I5032 Chronic diastolic (congestive) heart failure: Secondary | ICD-10-CM | POA: Diagnosis not present

## 2024-01-25 DIAGNOSIS — N182 Chronic kidney disease, stage 2 (mild): Secondary | ICD-10-CM | POA: Diagnosis not present

## 2024-01-25 DIAGNOSIS — I081 Rheumatic disorders of both mitral and tricuspid valves: Secondary | ICD-10-CM | POA: Insufficient documentation

## 2024-01-25 DIAGNOSIS — Z952 Presence of prosthetic heart valve: Secondary | ICD-10-CM | POA: Diagnosis not present

## 2024-01-25 DIAGNOSIS — I130 Hypertensive heart and chronic kidney disease with heart failure and stage 1 through stage 4 chronic kidney disease, or unspecified chronic kidney disease: Secondary | ICD-10-CM | POA: Insufficient documentation

## 2024-01-25 DIAGNOSIS — I4819 Other persistent atrial fibrillation: Secondary | ICD-10-CM | POA: Insufficient documentation

## 2024-01-25 HISTORY — PX: ESOPHAGOGASTRODUODENOSCOPY (EGD) WITH PROPOFOL: SHX5813

## 2024-01-25 HISTORY — PX: POLYPECTOMY: SHX149

## 2024-01-25 HISTORY — PX: COLONOSCOPY WITH PROPOFOL: SHX5780

## 2024-01-25 SURGERY — ESOPHAGOGASTRODUODENOSCOPY (EGD) WITH PROPOFOL
Anesthesia: Monitor Anesthesia Care

## 2024-01-25 MED ORDER — PROPOFOL 500 MG/50ML IV EMUL
INTRAVENOUS | Status: DC | PRN
Start: 2024-01-25 — End: 2024-01-25
  Administered 2024-01-25 (×2): 10 mg via INTRAVENOUS
  Administered 2024-01-25: 80 mg via INTRAVENOUS
  Administered 2024-01-25 (×3): 10 mg via INTRAVENOUS

## 2024-01-25 MED ORDER — LIDOCAINE HCL (PF) 2 % IJ SOLN
INTRAMUSCULAR | Status: DC | PRN
  Administered 2024-01-25: 30 mg via INTRADERMAL
  Administered 2024-01-25: 40 mg via INTRADERMAL
  Administered 2024-01-25: 70 mg via INTRADERMAL
  Administered 2024-01-25: 30 mg via INTRADERMAL

## 2024-01-25 MED ORDER — SODIUM CHLORIDE 0.9 % IV SOLN: INTRAVENOUS | Status: DC | PRN

## 2024-01-25 SURGICAL SUPPLY — 23 items
BLOCK BITE 60FR ADLT L/F BLUE (MISCELLANEOUS) ×2 IMPLANT
ELECT REM PT RETURN 9FT ADLT (ELECTROSURGICAL) IMPLANT
ELECTRODE REM PT RTRN 9FT ADLT (ELECTROSURGICAL) IMPLANT
FLOOR PAD 36X40 (MISCELLANEOUS) ×2 IMPLANT
FORCEP RJ3 GP 1.8X160 W-NEEDLE (CUTTING FORCEPS) IMPLANT
FORCEPS BIOP RAD 4 LRG CAP 4 (CUTTING FORCEPS) IMPLANT
FORCEPS BIOP RJ4 240 W/NDL (CUTTING FORCEPS) IMPLANT
FORCEPS BXJMBJMB 240X2.8X (CUTTING FORCEPS) IMPLANT
INJECTOR/SNARE I SNARE (MISCELLANEOUS) IMPLANT
LUBRICANT JELLY 4.5OZ STERILE (MISCELLANEOUS) IMPLANT
MANIFOLD NEPTUNE II (INSTRUMENTS) IMPLANT
NDL SCLEROTHERAPY 25GX240 (NEEDLE) IMPLANT
NEEDLE SCLEROTHERAPY 25GX240 (NEEDLE) IMPLANT
PAD FLOOR 36X40 (MISCELLANEOUS) ×2 IMPLANT
PROBE APC STR FIRE (PROBE) IMPLANT
PROBE INJECTION GOLD 7FR (MISCELLANEOUS) IMPLANT
SNARE ROTATE MED OVAL 20MM (MISCELLANEOUS) IMPLANT
SNARE SHORT THROW 13M SML OVAL (MISCELLANEOUS) IMPLANT
SYR 50ML LL SCALE MARK (SYRINGE) IMPLANT
TRAP SPECIMEN MUCOUS 40CC (MISCELLANEOUS) IMPLANT
TUBING ENDO SMARTCAP PENTAX (MISCELLANEOUS) ×4 IMPLANT
TUBING IRRIGATION ENDOGATOR (MISCELLANEOUS) ×2 IMPLANT
WATER STERILE IRR 1000ML POUR (IV SOLUTION) IMPLANT

## 2024-01-25 NOTE — Anesthesia Postprocedure Evaluation (Signed)
 Anesthesia Post Note  Patient: Aloni Lanah Steines  Procedure(s) Performed: ESOPHAGOGASTRODUODENOSCOPY (EGD) WITH PROPOFOL COLONOSCOPY WITH PROPOFOL POLYPECTOMY, INTESTINE     Patient location during evaluation: PACU Anesthesia Type: MAC Level of consciousness: awake and alert Pain management: pain level controlled Vital Signs Assessment: post-procedure vital signs reviewed and stable Respiratory status: spontaneous breathing, nonlabored ventilation, respiratory function stable and patient connected to nasal cannula oxygen Cardiovascular status: stable and blood pressure returned to baseline Postop Assessment: no apparent nausea or vomiting Anesthetic complications: no   No notable events documented.  Last Vitals:  Vitals:   01/25/24 0901 01/25/24 0910  BP: (!) 128/59 (!) 148/80  Pulse: 83 81  Resp: 20 (!) 22  Temp:    SpO2: 95% 93%    Last Pain:  Vitals:   01/25/24 0910  TempSrc:   PainSc: 0-No pain                 Lethaniel Rave

## 2024-01-25 NOTE — Op Note (Signed)
 [facility] Patient Name: [name] Procedure Date: 01/25/2024 MRN: [id number] Attending MD: Lynann Bologna , MD, [id number] Date of Birth: [birth year] CSN: [id number] Age: 78 Admit Type: Outpatient Procedure:                Upper GI endoscopy Indications:              Heme positive stool Providers:                Lynann Bologna, MD, Martha Clan, RN, Geoffery Lyons, Technician Referring MD:              Medicines:                Monitored Anesthesia Care Complications:            No immediate complications. Estimated Blood Loss:     Estimated blood loss: none. Procedure:                Pre-Anesthesia Assessment:                           - Prior to the procedure, a History and Physical                            was performed, and patient medications and                            allergies were reviewed. The patient's tolerance of                            previous anesthesia was also reviewed. The risks                            and benefits of the procedure and the sedation                            options and risks were discussed with the patient.                            All questions were answered, and informed consent                            was obtained. Prior Anticoagulants: The patient has                            taken Eliquis (apixaban), last dose was 2 days                            prior to procedure. ASA Grade Assessment: IV - A                            patient with severe systemic disease that is a  constant threat to life. After reviewing the risks                            and benefits, the patient was deemed in                            satisfactory condition to undergo the procedure.                           After obtaining informed consent, the endoscope was                            passed under direct vision. Throughout the                            procedure, the  patient's blood pressure, pulse, and                            oxygen saturations were monitored continuously. The                            [id number]-H190 ([id number]) Olympus endoscope was introduced                            through the mouth, and advanced to the second part                            of duodenum. The upper GI endoscopy was                            accomplished without difficulty. The patient                            tolerated the procedure well. Scope In: Scope Out: Findings:      The examined esophagus was normal.      The Z-line was regular and was found 38 cm from the incisors.      Diffuse mild inflammation characterized by erythema and nodularity was       found in the entire examined stomach. Biopsies were taken with a cold       forceps for histology.      The examined duodenum was normal. Biopsies for histology were taken with       a cold forceps for evaluation of celiac disease. Impression:               - Normal esophagus.                           - Z-line regular, 38 cm from the incisors.                           - Gastritis. Biopsied.                           - Normal examined duodenum. Biopsied. Moderate Sedation:      Not Applicable -  Patient had care per Anesthesia. Recommendation:           - Patient has a contact number available for                            emergencies. The signs and symptoms of potential                            delayed complications were discussed with the                            patient. Return to normal activities tomorrow.                            Written discharge instructions were provided to the                            patient.                           - Resume previous diet.                           - Continue present medications.                           - Await pathology results.                           - Proceed with colonoscopy.                           - The findings and recommendations were  discussed                            with the patient's family. Procedure Code(s):        --- Professional ---                           [phone], Esophagogastroduodenoscopy, flexible,                            transoral; with biopsy, single or multiple Diagnosis Code(s):        --- Professional ---                           K29.70, Gastritis, unspecified, without bleeding                           R19.5, Other fecal abnormalities CPT copyright 2022 American Medical Association. All rights reserved. The codes documented in this report are preliminary and upon coder review may  be revised to meet current compliance requirements. Lynann Bologna, MD 01/25/2024 8:58:19 AM This report has been signed electronically. Number of Addenda: 0

## 2024-01-25 NOTE — Op Note (Addendum)
 [facility] Patient Name: [name] Procedure Date: 01/25/2024 MRN: [id number] Attending MD: Lajuan Pila , MD, [id number] Date of Birth: 10/11/1945 CSN: [id number] Age: 78 Admit Type: Outpatient Procedure:                Colonoscopy Indications:              #1. H+ stools - Hb 11.7, MCV 99, plts 98K                            (11/27)-being followed by Dr. Scherrie Curt (neg BM Bx)                           #2. H/O Rectal cancer, midrectum T3N0M0, s/p neoadj                            chemoXRT, lap LAR with diverting loop ileostomy                            08/2011, ileostomy takedown 03/2012. Colonoscopy                            03/2013 found 1 small TA, LAR anastomosis was a bit                            narrowed, edematous and dilated to 2cm with                            balloon. Colonoscopy Aug 2022-mild colocolic                            stenosis. CT showing anastomotic thickness-rule out                            mass Providers:                Lajuan Pila, MD, Lonzell Robin, RN, Alfredia Ina, Technician Referring MD:              Medicines:                Monitored Anesthesia Care Complications:            No immediate complications. Estimated Blood Loss:     Estimated blood loss: none. Procedure:                Pre-Anesthesia Assessment:                           - Prior to the procedure, a History and Physical                            was performed, and patient medications and                            allergies were reviewed. The  patient's tolerance of                            previous anesthesia was also reviewed. The risks                            and benefits of the procedure and the sedation                            options and risks were discussed with the patient.                            All questions were answered, and informed consent                            was obtained. Prior Anticoagulants:  The patient has                            taken Eliquis (apixaban), last dose was 2 days                            prior to procedure. ASA Grade Assessment: IV - A                            patient with severe systemic disease that is a                            constant threat to life. After reviewing the risks                            and benefits, the patient was deemed in                            satisfactory condition to undergo the procedure.                           After obtaining informed consent, the colonoscope                            was passed under direct vision. Throughout the                            procedure, the patient's blood pressure, pulse, and                            oxygen saturations were monitored continuously. The                            [id number]-HQ190L ([id number]) Olympus colonoscope was                            introduced through the anus and advanced to the the  cecum, identified by appendiceal orifice and                            ileocecal valve. The colonoscopy was performed                            without difficulty. The patient tolerated the                            procedure well. The quality of the bowel                            preparation was fair. The ileocecal valve,                            appendiceal orifice, and rectum were photographed. Scope In: 8:25:48 AM Scope Out: 8:44:47 AM Scope Withdrawal Time: 0 hours 14 minutes 59 seconds  Total Procedure Duration: 0 hours 18 minutes 59 seconds  Findings:      There was evidence of a prior end-to-end colo-colonic anastomosis in the       distal rectum, 5 cm from the dentate line. This was patent and was       characterized by congestion, edema and mild stenosis. The anastomosis       was traversed using pediatric scope.      A 10 mm polyp was found in the proximal transverse colon. The polyp was       sessile. The polyp was removed with a cold  snare. Resection and       retrieval were complete.      A few medium-mouthed diverticula were found in the sigmoid colon.      A moderate amount of stool was found throughout the colon especially in       the cecum, interfering with visualization. Lavage of the area was       performed, resulting in incomplete clearance with fair visualization.       Overall over 70 to 75% of the colonic mucosa was visualized       satisfactorily. Small and flat lesions could have been missed. No       circumferential lesions were noted.      Non-bleeding internal hemorrhoids were found during perianal exam. The       hemorrhoids were moderate.      The exam was otherwise without abnormality. Impression:               - Preparation of the colon was fair. No                            circumferential lesions were noted.                           - Patent end-to-end colo-colonic anastomosis in the                            distal rectum, characterized by congestion, edema                            and mild stenosis.                           -  One 10 mm polyp in the proximal transverse colon,                            removed with a cold snare. Resected and retrieved.                           - Mild sigmoid diverticulosis.                           - Non-bleeding internal hemorrhoids.                           - The examination was otherwise normal. No                            recurrence of colon cancer. Moderate Sedation:      Not Applicable - Patient had care per Anesthesia. Recommendation:           - Patient has a contact number available for                            emergencies. The signs and symptoms of potential                            delayed complications were discussed with the                            patient. Return to normal activities tomorrow.                            Written discharge instructions were provided to the                            patient.                            - Resume previous diet.                           - Resume Eliquis (apixaban) at prior dose 4/20.                           - If continued problems, would recommend repeating                            colonoscopy after today extensive prep.                           - Please continue Colace/MiraLAX                           - The findings and recommendations were discussed                            with the patient's son. Procedure Code(s):        --- Professional ---  16109, Colonoscopy, flexible; with removal of                            tumor(s), polyp(s), or other lesion(s) by snare                            technique Diagnosis Code(s):        --- Professional ---                           K64.8, Other hemorrhoids                           Z98.0, Intestinal bypass and anastomosis status                           D12.3, Benign neoplasm of transverse colon (hepatic                            flexure or splenic flexure)                           R19.5, Other fecal abnormalities                           K57.30, Diverticulosis of large intestine without                            perforation or abscess without bleeding CPT copyright 2022 American Medical Association. All rights reserved. The codes documented in this report are preliminary and upon coder review may  be revised to meet current compliance requirements. Lajuan Pila, MD 01/25/2024 8:55:13 AM This report has been signed electronically. Number of Addenda: 0

## 2024-01-25 NOTE — Anesthesia Preprocedure Evaluation (Addendum)
 Anesthesia Evaluation  Patient identified by MRN, date of birth, ID band Patient awake    Reviewed: Allergy & Precautions, NPO status , Patient's Chart, lab work & pertinent test results  History of Anesthesia Complications (+) PONV and history of anesthetic complications  Airway Mallampati: II  TM Distance: >3 FB Neck ROM: Full    Dental no notable dental hx. (+) Dental Advisory Given, Teeth Intact   Pulmonary sleep apnea (does not use CPAP) , pneumonia, COPD,  COPD inhaler, former smoker   Pulmonary exam normal breath sounds clear to auscultation       Cardiovascular hypertension, Pt. on medications pulmonary hypertension(-) angina Normal cardiovascular exam+ dysrhythmias Atrial Fibrillation + pacemaker (St Jude dual chamber for 2:1 AVB) + Valvular Problems/Murmurs (S/P MVR and AVR 2015, mild MS)  Rhythm:Regular Rate:Normal  TEE 08/2023: IMPRESSIONS     1. Left ventricular ejection fraction, by estimation, is 60 to 65%. The  left ventricle has normal function. The left ventricle has no regional  wall motion abnormalities. There is severe left ventricular hypertrophy.   2. Right ventricular systolic function is normal. The right ventricular  size is mildly enlarged.   3. Left atrial size was moderately dilated. No left atrial/left atrial  appendage thrombus was detected.   4. Right atrial size was moderately dilated.   5. The mitral valve has been repaired/replaced. Trivial mitral valve  regurgitation. Mild to moderate mitral stenosis. There is a 25 mm Edwards  MagnaEase bioprosthetic valve present in the mitral position. Procedure  Date: 12/08/13.   6. Tricuspid valve regurgitation is moderate.   7. The aortic valve has been repaired/replaced. Aortic valve  regurgitation is not visualized. There is a 21 mm Magna bioprosthetic  valve present in the aortic position. Procedure Date: 12/08/13.   8. Aortic dilatation noted. There is  mild dilatation of the ascending  aorta, measuring 41 mm. There is Moderate (Grade III) plaque involving the  descending aorta.   9. Moderately dilated pulmonary artery.  10. Evidence of atrial level shunting detected by color flow Doppler.  There is a small patent foramen ovale.    Comparison(s): A prior study was performed on 10/06/2022. Prior  transmitral valve gradient (mean 10 mmHg) and transaortic gradient (mean  ).    Pacemaker interrogation 04/2023 Dr. Royann Shivers comments "Complete heart block: Pacemaker dependent. Battery status is good. Lead measurements are stable. Heart rate histogram is favorable. No clinically significant episodes of high ventricular rate or atrial mode switch noted.  Occasional brief episodes of atrial tachycardia, but atrial fibrillation has not been seen since October 2022."        5/19 Stress: EF 57%, normal with no evidence of ischemia or prior MI '17 ECHO: EF 60-65%, AVR and MVR functioning normally   Neuro/Psych  PSYCHIATRIC DISORDERS  Depression    Chronic back pain    GI/Hepatic ,GERD  Controlled,,H/o pancreatitis H/o SBO   Endo/Other  negative endocrine ROS    Renal/GU Renal disease     Musculoskeletal  (+) Arthritis ,    Abdominal   Peds  Hematology  (+) Blood dyscrasia, anemia xarelto   Anesthesia Other Findings   Reproductive/Obstetrics                             Anesthesia Physical Anesthesia Plan  ASA: 4  Anesthesia Plan: MAC   Post-op Pain Management: Minimal or no pain anticipated   Induction: Intravenous  PONV Risk Score and Plan:  4 or greater and Treatment may vary due to age or medical condition, Propofol infusion and TIVA  Airway Management Planned: Natural Airway  Additional Equipment:   Intra-op Plan:   Post-operative Plan:   Informed Consent: I have reviewed the patients History and Physical, chart, labs and discussed the procedure including the risks,  benefits and alternatives for the proposed anesthesia with the patient or authorized representative who has indicated his/her understanding and acceptance.     Dental advisory given  Plan Discussed with: CRNA  Anesthesia Plan Comments: (Risks of anesthesia explained at length. This includes, but is not limited to, sore throat, damage to teeth, lips gums, tongue and vocal cords, nausea and vomiting, reactions to medications, stroke, heart attack, and death. All patient questions were answered and the patient wishes to proceed.   )        Anesthesia Quick Evaluation

## 2024-01-25 NOTE — H&P (Signed)
 Chief Complaint: Heme positive stools   Referring Provider:  [name], MD        ASSESSMENT AND PLAN;    #1. H+ stools - Hb 11.7, MCV 99, plts 98K (11/27)-being followed by Dr. Truett Perna (neg BM Bx)   #2. H/O Rectal cancer, midrectum T3N0M0, s/p neoadj chemoXRT, lap LAR with diverting loop ileostomy 08/2011, ileostomy takedown 03/2012.  Colonoscopy 03/2013 found 1 small TA, LAR anastomosis was a bit narrowed, edematous and dilated to 2cm with balloon.  Colonoscopy Aug 2022-mild colocolic stenosis.    #3. H/O Mushy stools- on colace/miralax   #4. Valvular heart disease/A fib on eliquis   Plan: -CT AP with contrast Done- 1. Prior rectosigmoid resection with reanastomosis. Similar soft tissue thickening about the anastomotic suture line with mesorectal and presacral soft tissue/fluid common no new suspicious soft tissue nodularity or enhancement. Nonspecific but favored posttreatment change. However, in the setting of heme-positive stool would consider further evaluation of this area by colonoscopy. 2. Stable obturator lymph node versus soft tissue nodule measuring 15 x 11 mm and mesorectal lymph node measuring 3 mm in short axis. Continued attention on follow-up imaging suggested. 3. No evidence of distant metastatic disease in the abdomen or pelvis.  Plan for EGD/colon today off eliquis Cleared by cardiology.     HPI:     [name] is a 78 y.o. female  With multiple medical problems as listed below including A Fib/flutter, valvular heart disease on Eliquis, HFpEF, chronic pancytopenia with neg BM Bx, history of AV block s/p pacemaker placement, anxiety/depression, ascending aortic aneurysm, COPD, history of rectal cancer   Here for heme + stools, Hb 11.7, MCV 99, plts 98K (11/27)-being followed by Dr. Truett Perna (neg BM Bx)   C/O concerns about her blood count. She has been experiencing issues with low blood levels for approximately six months. Despite  a bone marrow biopsy showing no signs of cancer, the cause of the low blood levels remains undetermined. The patient has been on iron supplements for two weeks to address this issue.   In Nov 2024, the patient experienced severe breathing difficulties, leading to an ER visit and subsequent hospital admission. She was diagnosed with atrial fibrillation, which progressed to flutter, and heart failure. The patient required cardioversion to restore normal heart rhythm.   The patient also reports ongoing issues with leg wounds that spontaneously open and bleed, requiring regular visits to a wound clinic. She has been attending physical therapy for balance issues but has exhausted her sessions for the current year.   The patient is on Eliquis for blood thinning, which has resulted in easy bruising. She also reports a decrease in her COPD and pulmonary function, with her pulmonologist considering a bronchoscopy.   The patient has been experiencing inconsistent bowel movements, with some days being normal and others resulting in mushy stools. She suspects this may be related to fecal incontinence. She has a history of rectal cancer and has noticed some narrowing where she was reconnected post-surgery.   The patient also reports occasional nausea, which she attributes to taking medication on an empty stomach. She has been taking Colace, a stool softener, which may be contributing to the consistency of her stools. She also reports chronic fatigue and a lack of energy.             Past GI workup:   Colonoscopy 05/24/2021 - Diverticulosis in the left colon. - Colo- colonic LAR anastomosis was edematous and a  bit narrowed but otherwise normal appearing. - No polyps or cancers.   Colonoscopy 05/2016 - Patent end- to- end colo- colonic anastomosis. - The examination was otherwise normal on direct and retroflexion views. - No polyps or cancers       On review of previous records: -Macrocytic anemia s/p bone  marrow biopsy 08/23/2023: neg. decreased iron stores.  Normal cytogenetics. -Admission with heart failure, atrial fibrillation/flutter, and COPD exacerbation 08/27/2023: Cardioversion performed for 19 2024             Past Medical History:  Diagnosis Date   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 12/31/2016   Acute cystitis without hematuria 12/22/2015   Acute right-sided low back pain without sciatica 11/26/2019    Saw [facility]ner 12/2019 - planned L2/3 translaminar epidural steroid injection. Known DISH with thoracic spine fusion and moderate kyphosis, h/o L spine fusion L2-5   Adjustment disorder with depressed mood 13-Nov-2014    24 yo son died MI 2014/10/28 Husband died car accident 29-Apr-2015   Anemia     Anticoagulant long-term use 06/14/2019    She takes warfarin for afib/flutter with CHADS2VASc score of 4 (age, sex, CHF, HTN), s/p 2 bioprosthetic valve replacements Coumadin changed to eliquis 2022 per cardiology   Aortic atherosclerosis 03/03/2022    Noted on 01/2022 chest CT   Arthritis     Arthropathy of spinal facet joint 03/08/2018   Ascending aortic aneurysm 03/02/2021    4.1cm on CT 01/2021 rec yearly monitoring   Bilateral hip pain 09/08/2016   Cancer (HCC)      conon cancer 2012   CAP (community acquired pneumonia) 02/08/2016   Carotid stenosis 12/08/2020    Carotid US  - 40-59% BICA stenosis (2015) Carotid US  12/2020 - 1-39% BICA stenosis, incidental 3.4cm structure behind L common carotid artery rec further imaging    Cataracts, bilateral      immature   Chest pain 08/25/2014    myoview low risk 02/2018 ([facility])   Chronic cholecystitis with calculus 01/21/2015    S/p cholecystectomy    Chronic diastolic heart failure 09/19/2014   Chronic insomnia     Chronic lower back pain 03/05/2008    Returned to Dr Ellery Guthrie - translaminar ESI at L3/4. Myelogram showed significant left sided foraminal stenosis at L2/3 and L3/4 in setting of solid arthrodesis, planned DEXA and if stable osteopenia,  considering anterolateral decompression with spacer.    CKD (chronic kidney disease) stage 2, GFR 60-89 ml/min 07/03/2015   Constipation      takes Miralax daily as needed   COPD (chronic obstructive pulmonary disease)      Albuterol inhaler daily as needed;Duoneb daily as needed;Spiriva daily   COVID-19 virus infection 11/11/2022   DDD (degenerative disc disease)      cervical - kyphosis with mod DD changes C5/6 and C6/7; lumbar - early DD at L2/3 ([facility]ner)   Depression     DISH (diffuse idiopathic skeletal hyperostosis) 05/14/2019    By MRI noted fusion of lower thoracic vertebrae - likely contributing to unsteadiness (Elsner)   Dyspnea on exertion 06/05/2015    myoview low risk 02/2018   Edema of both lower legs due to peripheral venous insufficiency     Essential hypertension, benign 03/24/2009    hx of not on nmeds currenlty and has been a while since on meds per pt   Heart murmur     Hematuria 06/17/2021   History of kidney stones     History of radiation therapy 05/30/11 to 07/07/11  rectum   History of rectal cancer 05/26/2011    Rectal cancer 2012, midrectum ypT3ypN0, s/p neoadj chemo&XRT, lap LAR with diverting loop ileostomy 08/2011, ileostomy takedown 03/2012 Discharged from oncology clinic 2017 Truett Perna)   History of shingles     History of vertebral fracture 11/26/2019    Old L1 vertebral compression fracture incidentally noted on imaging 2020    Hyperlipemia      takes Lovastatin daily   Lacunar infarction      Left caudate   Left hip pain 07/29/2020   Legally blind in left eye     Lymphangioma 01/07/2021    Noted incidentally on carotid US 12/2020 CT neck - Likely benign lymphangioma 01/2021   Macrocytosis 03/27/2022    Folate and b12 checked 2023   Malaise and fatigue 01/22/2018   Meralgia paresthetica 12/13/2007   Mixed stress and urge urinary incontinence 03/16/2015    Failed oxybutynin IR/ER. Vesicare initially helped but then no longer helpful.  myrbetriq was  not effective.  Saw urology (MacDiarmid) - UDS largely overactive bladder with mild-mod stress incontinence. Improved on abx course - maintained on [place].    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   Pulmonary infiltrates 04/23/2019    06/13/2018-CT super D chest without contrast- generally stable chronic lung disease with pleural parenchymal scarring and scattered nodularity most of these nodules have been tree-in-bud in appearance likely postinfectious or inflammatory the dominant right lower lobe nodule seen on most recent study has resolved no new or enlarging nodules  04/22/2019-CT chest without contrast- waxing and waning per   Pulmonary nodule 02/23/2012    RLL nodule-34mm stable 2006, April 2009, and June 2009 Small pulm nodules Jan 2012 - > Oct 2012 without change   Rheumatic heart disease mitral stenosis      mod MS by echo 02/2014   Right leg swelling 06/11/2020    R venous US 06/2020 WNL: - No evidence of deep vein thrombosis in the lower  extremity. No indirect evidence of obstruction proximal to the inguinal ligament. - No cystic structure found in the popliteal fossa.   S/P aortic and mitral valve bioprostheses 12/08/2013    AVR 21 mm, MVR 25 mm-MagnaEase pericardia   S/P AVR (aortic valve replacement) 2015    bioprosthetic (Bartle)   S/P MVR (mitral valve replacement) 2015    bioprosthetic (Bartle)   Sepsis secondary to UTI (HCC) 08/22/2017   Severe aortic valve stenosis 10/01/2013    mild-mod by echo 02/2014   Skin lesion 01/03/2022   Small bowel obstruction, partial 08/2013  reolved without NGT placement.    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    Dr Amy [place]   Syncope 05/28/2018   Thrombocytopenia 11/04/2019   Typical atrial flutter 05/15/2019   Vitamin B12 deficiency 02/28/2015    Start B12 shots 02/2015    Vitamin D deficiency 10/23/2016   Wounds, multiple      bilateral legs               Past Surgical History:  Procedure Laterality Date   ANTERIOR LAT LUMBAR FUSION Left 07/17/2018    Procedure: Lumbar Two-Three Lumbar Three-Four Anterolateral decompression/interbody fusion with lateral plate fixation/Infuse;  Surgeon: Elna Haggis, MD;  Location: [facility] OR;  Service: Neurosurgery;  Laterality: Left;  Lumbar Two-Three Lumbar Three-Four Anterolateral decompression/interbody fusion with lateral plate fixation/Infuse   AORTIC VALVE REPLACEMENT N/A 12/12/2013    Procedure: AORTIC VALVE REPLACEMENT (AVR);  Surgeon: Bartley Lightning, MD; Service: Open Heart Surgery   BACK SURGERY   2006, 2007    2006 SPACER, 2007 decompression and fusion L4/5   BOWEL RESECTION   04/02/2012    Procedure: SMALL BOWEL RESECTION;  Surgeon: Lockie Rima, MD;  Location: [facility]S;  Service: General;  Laterality: N/A;   BRONCHIAL WASHINGS   03/24/2021    Procedure: BRONCHIAL WASHINGS;  Surgeon: [name], MD;  Location: [facility] ENDOSCOPY;  Service: Endoscopy;;   CARDIOVERSION N/A 08/29/2023    Procedure:  CARDIOVERSION (CATH LAB);  Surgeon: Lenise Quince, MD;  Location: [facility] INVASIVE CV LAB;  Service: Cardiovascular;  Laterality: N/A;   CARPAL TUNNEL RELEASE Bilateral     CATARACT EXTRACTION W/ INTRAOCULAR LENS IMPLANT Left 10/18/2016   CATARACT EXTRACTION W/ INTRAOCULAR LENS IMPLANT Right 12/13/2016    Dr. Matthew Songster   CHOLECYSTECTOMY N/A 01/21/2015    chronic cholecystitis, Lockie Rima, MD   COLON RESECTION   08/25/2011    Procedure: COLON RESECTION LAPAROSCOPIC;  Surgeon: Lockie Rima, MD;  Location: [facility];  Service: General;  Laterality: N/A;  Laparoscopic Assisted Low Anterior Resection Diverting Ostomy and onQ pain pump   COLONOSCOPY   03/2013    1 polyp, rpt 3 yrs Howard Macho)   COLONOSCOPY   05/2021    diverticulosis and patent colon anastomosis with some edema/narrowing, rpt 5 yrs Howard Macho)   COLONOSCOPY WITH PROPOFOL N/A 06/09/2016    patent colo-colonic anastomosis, rpt 5 yrs Howard Macho)   CYSTOSCOPY/URETEROSCOPY/HOLMIUM LASER/STENT PLACEMENT Right 07/03/2023    Procedure: CYSTOSCOPY RIGHT RETROGRADE PYEPLGRAM RIGHT URETEROSCOPY/HOLMIUM LASER/STENT PLACEMENT;  Surgeon: Samson Croak, MD;  Location: [facility];  Service: Urology;  Laterality: Right;  1 HR FOR CASE   CYSTOSCOPY/URETEROSCOPY/HOLMIUM LASER/STENT PLACEMENT Right 07/17/2023    Procedure: CYSTOSCOPY/RIGHT URETEROSCOPY/STENT EXCHANGE;  Surgeon: Samson Croak, MD;  Location: [facility];  Service: Urology;  Laterality: Right;  60 MINS FOR CASE   EP IMPLANTABLE DEVICE N/A 06/16/2016    Procedure: Pacemaker Implant;  Surgeon: Will Cortland Ding, MD;  Location: [facility] INVASIVE CV LAB;  Service: Cardiovascular;  Laterality: N/A;   FOOT SURGERY   left foot    hammer toe   ILEOSTOMY   08/25/2011   ILEOSTOMY CLOSURE   04/02/2012    Procedure: ILEOSTOMY TAKEDOWN;  Surgeon: Lockie Rima, MD;  Location: [facility];  Service: General;  Laterality: N/A;   INTRAOPERATIVE TRANSESOPHAGEAL ECHOCARDIOGRAM N/A 12/12/2013    Procedure: INTRAOPERATIVE  TRANSESOPHAGEAL ECHOCARDIOGRAM;  Surgeon: Bartley Lightning, MD;  Location: [facility] OR;  Service: Open Heart Surgery;  Laterality: N/A;   LEFT AND RIGHT  HEART CATHETERIZATION WITH CORONARY ANGIOGRAM N/A 11/27/2013    Procedure: LEFT AND RIGHT HEART CATHETERIZATION WITH CORONARY ANGIOGRAM;  Surgeon: Luana Rumple, MD;  Location: [facility] CATH LAB;  Service: Cardiovascular;  Laterality: N/A;   MITRAL VALVE REPLACEMENT N/A 12/12/2013    Procedure: MITRAL VALVE (MV) REPLACEMENT OR REPAIR;  Surgeon: Bartley Lightning, MD; Service: Open Heart Surgery   TEE WITHOUT CARDIOVERSION N/A 11/29/2013    Procedure: TRANSESOPHAGEAL ECHOCARDIOGRAM (TEE);  Surgeon: Jacqueline Matsu, MD;  Location: [facility] ENDOSCOPY;  Service: Cardiovascular;  Laterality: N/A;   TEE WITHOUT CARDIOVERSION N/A 06/04/2019    Procedure: TRANSESOPHAGEAL ECHOCARDIOGRAM (TEE);  Surgeon: Luana Rumple, MD;  Location: [facility] ENDOSCOPY;  Service: Cardiovascular;  Laterality: N/A;   TONSILLECTOMY       TRANSESOPHAGEAL ECHOCARDIOGRAM (CATH LAB) N/A 08/29/2023    Procedure: TRANSESOPHAGEAL ECHOCARDIOGRAM;  Surgeon: Lenise Quince, MD;  Location: [facility] INVASIVE CV LAB;  Service: Cardiovascular;  Laterality: N/A;   VIDEO BRONCHOSCOPY N/A 03/24/2021    Procedure: VIDEO BRONCHOSCOPY WITHOUT FLUORO;  Surgeon: [name], MD;  Location: [facility] ENDOSCOPY;  Service: Endoscopy;  Laterality: N/A;  SMALL SCOPE,PREMEDICATE WITH NEBULIZED LIDOCAINE               Family History  Problem Relation Age of Onset   Heart disease Mother     Diabetes Mother     Hypertension Mother     Stroke Mother     Lung cancer Father     Breast cancer Maternal Aunt 46   Heart attack Maternal Grandfather     CAD Son 5   Heart attack Son 7   Rectal cancer Neg Hx     Stomach cancer Neg Hx     Esophageal cancer Neg Hx     Colon cancer Neg Hx            Social History  Social History         Tobacco Use   Smoking status: Former      Current packs/day: 0.00      Average  packs/day: 1.5 packs/day for 10.0 years (15.0 ttl pk-yrs)      Types: Cigarettes      Start date: 12/27/1964      Quit date: 12/28/1974      Years since quitting: 48.7   Smokeless tobacco: Never   Tobacco comments:      quit smoking in 1976  Vaping Use   Vaping status: Never Used  Substance Use Topics   Alcohol use: Never      Alcohol/week: 2.0 standard drinks of alcohol      Types: 2 Glasses of wine per week   Drug use: No              Current Outpatient Medications  Medication Sig Dispense Refill   acetaminophen (TYLENOL) 500 MG tablet Take 2 tablets (1,000 mg total) by mouth daily. (Patient taking differently: Take 1,000 mg by mouth in the morning.)       acidophilus (RISAQUAD) CAPS capsule Take 1 capsule by mouth daily.       albuterol (VENTOLIN HFA) 108 (90 Base) MCG/ACT inhaler Inhale 2 puffs into the lungs every 6 (six) hours as needed for wheezing. 18 g 6   ascorbic acid (VITAMIN C) 500 MG tablet Take 1 tablet (500 mg total) by mouth daily.       Biotin 1000 MCG tablet Take 1,000 mcg by mouth daily.       buPROPion (WELLBUTRIN SR) 150 MG 12 hr tablet TAKE  1 TABLET(150 MG) BY MOUTH EVERY MORNING (Patient taking differently: Take 150 mg by mouth in the morning.) 90 tablet 1   calcium carbonate (OSCAL) 1500 (600 Ca) MG TABS tablet Take 600 mg of elemental calcium by mouth daily.       carboxymethylcellulose (REFRESH PLUS) 0.5 % SOLN Place 1 drop into both eyes 3 (three) times daily as needed (dry eyes).       cephALEXin (KEFLEX) 250 MG capsule Take 250 mg by mouth daily with breakfast.       cholecalciferol (VITAMIN D3) 25 MCG (1000 UNIT) tablet Take 1,000 Units by mouth daily.       docusate sodium (COLACE) 100 MG capsule Take 200 mg by mouth daily.       ELIQUIS 5 MG TABS tablet TAKE 1 TABLET(5 MG) BY MOUTH TWICE DAILY (Patient taking differently: Take 5 mg by mouth in the morning and at bedtime.) 180 tablet 1   ferrous sulfate 325 (65 FE) MG EC tablet Take 1 tablet (325 mg  total) by mouth daily with breakfast.       Fluticasone-Umeclidin-Vilant (TRELEGY ELLIPTA) 200-62.5-25 MCG/ACT AEPB Inhale 1 puff into the lungs daily. 60 each 11   furosemide (LASIX) 40 MG tablet TAKE 1 TABLET BY MOUTH EVERY DAY. MAY TAKE AN EXTRA TABLET AS NEEDED FOR SWELLING (Patient taking differently: Take 40 mg by mouth in the morning.) 135 tablet 2   gabapentin (NEURONTIN) 100 MG capsule Take 1 capsule (100 mg total) by mouth 2 (two) times daily as needed (back pain - for daytime use). (Patient taking differently: Take 100 mg by mouth daily.) 60 capsule 1   gabapentin (NEURONTIN) 300 MG capsule Take 300 mg by mouth at bedtime.       hydrocortisone (ANUSOL-HC) 2.5 % rectal cream Place 1 Application rectally 2 (two) times daily. (Patient taking differently: Place 1 Application rectally 2 (two) times daily as needed for hemorrhoids (or pain).) 30 g 0   ipratropium-albuterol (DUONEB) 0.5-2.5 (3) MG/3ML SOLN Take 3 mLs by nebulization every 6 (six) hours as needed. 360 mL 0   lisinopril (ZESTRIL) 2.5 MG tablet Take 1 tablet (2.5 mg total) by mouth daily. 30 tablet 2   lovastatin (MEVACOR) 40 MG tablet TAKE 1 TABLET(40 MG) BY MOUTH AT BEDTIME (Patient taking differently: Take 40 mg by mouth daily.) 90 tablet 0   metoprolol succinate (TOPROL XL) 25 MG 24 hr tablet Take 1 tablet (25 mg total) by mouth every evening. 90 tablet 3   montelukast (SINGULAIR) 10 MG tablet TAKE 1 TABLET(10 MG) BY MOUTH AT BEDTIME 90 tablet 1   Multiple Vitamins-Minerals (PRESERVISION AREDS 2 PO) Take 2 capsules by mouth in the morning.       ondansetron (ZOFRAN-ODT) 4 MG disintegrating tablet Take 1 tablet (4 mg total) by mouth every 8 (eight) hours as needed for nausea or vomiting. 20 tablet 1   Phenazopyrid-Cranbry-C-Probiot (AZO URINARY TRACT SUPPORT PO) Take 1 capsule by mouth daily.       polyethylene glycol (MIRALAX / GLYCOLAX) packet Take 17 g by mouth daily. (Patient taking differently: Take 17 g by mouth daily as  needed for mild constipation.) 14 each 0   potassium chloride (KLOR-CON) 10 MEQ tablet TAKE 2 TABLETS(20 MEQ) BY MOUTH DAILY (Patient taking differently: Take 20 mEq by mouth in the morning.) 180 tablet 1   psyllium (REGULOID) 0.52 g capsule Take 0.52 g by mouth in the morning and at bedtime.       traMADol (ULTRAM) 50 MG  tablet Take 1 tablet (50 mg total) by mouth 2 (two) times daily as needed for moderate pain (pain score 4-6). 60 tablet 0   traZODone (DESYREL) 50 MG tablet TAKE 1/2 TO 1 TABLET(25 TO 50 MG) BY MOUTH AT BEDTIME (Patient taking differently: Take 50 mg by mouth at bedtime.) 30 tablet 6   Vibegron (GEMTESA) 75 MG TABS Take 75 mg by mouth daily.       vitamin B-12 (CYANOCOBALAMIN) 500 MCG tablet Take 1 tablet (500 mcg total) by mouth 2 (two) times a week.       Zinc 50 MG CAPS Take 1 capsule (50 mg total) by mouth daily.   0   alendronate (FOSAMAX) 70 MG tablet Take 1 tablet (70 mg total) by mouth every 7 (seven) days. Take with a full glass of water on an empty stomach. (Patient not taking: Reported on 09/25/2023) 4 tablet 11      No current facility-administered medications for this visit.        Allergies       Allergies  Allergen Reactions   Tape Other (See Comments)      "THICK TAPES PULLS OFF MY SKIN"   Codeine Nausea Only   Prednisone Nausea Only   Sulfa Antibiotics Nausea Only        Review of Systems:  Constitutional: Denies fever, chills, diaphoresis, appetite change and fatigue.  HEENT: neg  Respiratory: Denies SOB, DOE, cough, chest tightness,  and wheezing.   Cardiovascular: Denies chest pain, palpitations and leg swelling.  Genitourinary: Denies dysuria, urgency, frequency, hematuria, flank pain and difficulty urinating.  Musculoskeletal: Denies myalgias, back pain, joint swelling, arthralgias and gait problem.  Skin: No rash.  Neurological: Denies dizziness, seizures, syncope, weakness, light-headedness, numbness and headaches.  Hematological: Denies  adenopathy. Easy bruising, personal or family bleeding history  Psychiatric/Behavioral: No anxiety or depression       Physical Exam:     BP 130/70   Pulse 79   Ht 5\' 4"  (1.626 m)   Wt 149 lb (67.6 kg)   BMI 25.58 kg/m     Wt Readings from Last 3 Encounters:  09/25/23 149 lb (67.6 kg)  09/11/23 146 lb (66.2 kg)  09/06/23 144 lb 9.6 oz (65.6 kg)    Constitutional:  Well-developed, in no acute distress. Psychiatric: Normal mood and affect. Behavior is normal. HEENT: Pupils normal.  Conjunctivae are normal. No scleral icterus. Cardiovascular: Normal rate, regular rhythm. No edema Pulmonary/chest: Effort normal and breath sounds normal. No wheezing, rales or rhonchi. Abdominal: Soft, nondistended. Nontender. Bowel sounds active throughout. There are no masses palpable. No hepatomegaly.  Well-healed surgical scars Rectal: Deferred Neurological: Alert and oriented to person place and time. Skin: Skin is warm and dry. No rashes noted.   Data Reviewed: I have personally reviewed following labs and imaging studies   CBC:     Latest Ref Rng & Units 09/06/2023    9:34 AM 08/30/2023    4:29 AM 08/29/2023    4:02 AM  CBC  WBC 4.0 - 10.5 K/uL 7.6  5.8  5.6   Hemoglobin 12.0 - 15.0 g/dL 32.4  40.1  02.7   Hematocrit 36.0 - 46.0 % 37.6  36.0  33.3   Platelets 150 - 400 K/uL 98  90  90       CMP:     Latest Ref Rng & Units 08/30/2023    4:29 AM 08/29/2023    4:02 AM 08/28/2023    4:05 AM  CMP  Glucose 70 - 99 mg/dL 161  096  045   BUN 8 - 23 mg/dL 26  25  20    Creatinine 0.44 - 1.00 mg/dL 4.09  8.11  9.14   Sodium 135 - 145 mmol/L 132  136  136   Potassium 3.5 - 5.1 mmol/L 4.4  4.1  4.3   Chloride 98 - 111 mmol/L 97  97  99   CO2 22 - 32 mmol/L 28  30  31    Calcium 8.9 - 10.3 mg/dL 8.9  9.2  9.1   Total Protein 6.5 - 8.1 g/dL     6.4   Total Bilirubin <1.2 mg/dL     0.6   Alkaline Phos 38 - 126 U/L     62   AST 15 - 41 U/L     16   ALT 0 - 44 U/L     10              Radiology Studies:  Imaging Results  DG Chest 2 View Result Date: 09/17/2023 CLINICAL DATA:  Pleural effusion EXAM: CHEST - 2 VIEW COMPARISON:  08/27/2023 FINDINGS: Unchanged cardiac silhouette and mediastinal contours post median sternotomy, valve replacement. Stable positioning of support apparatus. No change to slight reduction in trace layering bilateral effusions and associated bibasilar opacities. Redemonstrated right apical pleural-parenchymal thickening. There is mild diffuse slightly nodular thickening of the pulmonary interstitium, right-greater-than-left. Small amount of fluid is seen tracking within the left fissure. No new focal airspace opacities. No pneumothorax. No acute osseous abnormalities. IMPRESSION: 1. Stable examination without superimposed acute cardiopulmonary disease. 2. No change to slight reduction in trace layering bilateral effusions and associated bibasilar opacities, likely atelectasis 3. Pulmonary venous congestion without frank evidence of edema. 4. Unchanged right apical pleural-parenchymal thickening. Electronically Signed   By: Simonne Come [name]   On: 09/17/2023 18:36    ECHO TEE Result Date: 08/29/2023    TRANSESOPHOGEAL ECHO REPORT   Patient Name:   VILLA BURGIN Date of Exam: 08/29/2023 Medical Rec #:  [id number]            Height:       64.0 in Accession #:    [id number]           Weight:       153.9 lb Date of Birth:  01-Jul-1946            BSA:          1.750 m Patient Age:    77 years             BP:           138/70 mmHg Patient Gender: F                    HR:           59 bpm. Exam Location:  Inpatient Procedure: Transesophageal Echo, 3D Echo, Color Doppler and Cardiac Doppler Indications:     I48.91* Unspecified atrial fibrillation  History:         Patient has prior history of Echocardiogram examinations, most                  recent 08/28/2023. Pacemaker. 12/12/13 AVR with 21mm Magna Ease                  bioprosthetic and MVR with 25mm Mitral Magna  bioprosthetic.  Aortic Valve: 21 mm Magna bioprosthetic valve is present in the                  aortic position. Procedure Date: 12/08/13.                  Mitral Valve: 25 mm Edwards MagnaEase bioprosthetic valve valve                  is present in the mitral position. Procedure Date: 12/08/13.  Sonographer:     Irving Burton Senior RDCS Referring Phys:  [id number] BRIAN S CRENSHAW Diagnosing Phys: Olga Millers MD PROCEDURE: After discussion of the risks and benefits of a TEE, an informed consent was obtained from the patient. The transesophogeal probe was passed without difficulty through the esophogus of the patient. Sedation performed by different physician. The patient was monitored while under deep sedation. Anesthestetic sedation was provided intravenously by Anesthesiology: 120mg  of Propofol, 60mg  of Lidocaine. The patient developed no complications during the procedure. A successful direct current cardioversion was performed at 200 joules with 1 attempt.  IMPRESSIONS  1. Left ventricular ejection fraction, by estimation, is 60 to 65%. The left ventricle has normal function. The left ventricle has no regional wall motion abnormalities. There is severe left ventricular hypertrophy.  2. Right ventricular systolic function is normal. The right ventricular size is mildly enlarged.  3. Left atrial size was moderately dilated. No left atrial/left atrial appendage thrombus was detected.  4. Right atrial size was moderately dilated.  5. The mitral valve has been repaired/replaced. Trivial mitral valve regurgitation. Mild to moderate mitral stenosis. There is a 25 mm Edwards MagnaEase bioprosthetic valve present in the mitral position. Procedure Date: 12/08/13.  6. Tricuspid valve regurgitation is moderate.  7. The aortic valve has been repaired/replaced. Aortic valve regurgitation is not visualized. There is a 21 mm Magna bioprosthetic valve present in the aortic position. Procedure Date: 12/08/13.  8. Aortic  dilatation noted. There is mild dilatation of the ascending aorta, measuring 41 mm. 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  9. Moderately dilated pulmonary artery. 10. Evidence of atrial level shunting detected by color flow Doppler. There is a small patent foramen ovale. FINDINGS  Left Ventricle: Left ventricular ejection fraction, by estimation, is 60 to 65%. The left ventricle has normal function. The left ventricle has no regional wall motion abnormalities. The left ventricular internal cavity size was small. There is severe left ventricular hypertrophy. Right Ventricle: The right ventricular size is mildly enlarged. Right ventricular systolic function is normal. Left Atrium: Left atrial size was moderately dilated. No left atrial/left atrial appendage thrombus was detected. Right Atrium: Right atrial size was moderately dilated. Pericardium: There is no evidence of pericardial effusion. Mitral Valve: The mitral valve has been repaired/replaced. Trivial mitral valve regurgitation. There is a 25 mm Edwards MagnaEase bioprosthetic valve present in the mitral position. Procedure Date: 12/08/13. Mild to moderate mitral valve stenosis. MV peak gradient, 14.3 mmHg. The mean mitral valve gradient is 7.2 mmHg. Tricuspid Valve: The tricuspid valve is normal in structure. Tricuspid valve regurgitation is moderate. Aortic Valve: The aortic valve has been repaired/replaced. Aortic valve regurgitation is not visualized. Aortic valve mean gradient measures 12.0 mmHg. Aortic valve peak gradient measures 19.0 mmHg. There is a 21 mm Magna bioprosthetic valve present in the aortic position. Procedure Date: 12/08/13. Pulmonic Valve: The pulmonic valve was normal in structure. Pulmonic valve regurgitation is not visualized. Aorta: Aortic dilatation noted. There is mild dilatation  of the ascending aorta, measuring 41 mm. There is moderate (Grade III) plaque involving the descending aorta. Pulmonary Artery:  The pulmonary artery is moderately dilated. IAS/Shunts: Evidence of atrial level shunting detected by color flow Doppler. A small patent foramen ovale is detected. Additional Comments: Spectral Doppler performed. AORTIC VALVE AV Vmax:           218.00 cm/s AV Vmean:          169.000 cm/s AV VTI:            0.442 m AV Peak Grad:      19.0 mmHg AV Mean Grad:      12.0 mmHg LVOT Vmax:         65.40 cm/s LVOT Vmean:        49.100 cm/s LVOT VTI:          0.116 m LVOT/AV VTI ratio: 0.26 MITRAL VALVE             TRICUSPID VALVE MV Peak grad: 14.3 mmHg  TR Peak grad:   43.6 mmHg MV Mean grad: 7.2 mmHg   TR Vmax:        330.00 cm/s MV Vmax:      1.89 m/s MV Vmean:     124.0 cm/s SHUNTS                          Systemic VTI: 0.12 m Alexandria Angel MD Electronically signed by Alexandria Angel MD Signature Date/Time: 08/29/2023/12:16:56 PM    Final     EP STUDY Result Date: 08/29/2023 See surgical note for result.   ECHOCARDIOGRAM COMPLETE Result Date: 08/28/2023    ECHOCARDIOGRAM REPORT   Patient Name:   [name] Date of Exam: 08/28/2023 Medical Rec #:  [id number]            Height:       64.0 in Accession #:    [id number]           Weight:       149.0 lb Date of Birth:  04-02-1946            BSA:          1.726 m Patient Age:    77 years             BP:           152/83 mmHg Patient Gender: F                    HR:           63 bpm. Exam Location:  Inpatient Procedure: 2D Echo, Cardiac Doppler and Color Doppler Indications:    CHF-Acute Diastolic I50.31  History:        Patient has prior history of Echocardiogram examinations, most                 recent 10/06/2022. COPD, Mitral Valve Disease and Aortic Valve                 Disease, Arrythmias:Atrial Flutter; Signs/Symptoms:Murmur.  Sonographer:    Astrid Blamer Referring Phys: [id number] DAVID MANUEL ORTIZ IMPRESSIONS  1. Left ventricular ejection fraction, by estimation, is >75%. The left ventricle has hyperdynamic function. The left ventricle has no regional  wall motion abnormalities. There is moderate left ventricular hypertrophy. Left ventricular diastolic function could not be evaluated.  2. Right ventricular systolic function reduced. The right ventricular size is mildly enlarged. There is severely  elevated pulmonary artery systolic pressure. The estimated right ventricular systolic pressure is 64.0 mmHg.  3. Left atrial size was mildly dilated.  4. Right atrial size was mildly dilated.  5. 25 mm [facility] Ease bioprosthetic valve in the mitral position (12/08/13), well-seated, no regurgitation, findings to suggest mitral valve stenosis (peak velocity 2.68m/s, MG , TVI ratio 3.5, EOA0.80cm2, HR 75bpm) but higher gradients can also  be seen with increased flow through the mitral valve, increased HR, prosthesis-patient mismatch. Subvalvular calcification present. Clinical correlation required.  6. Tricuspid valve regurgitation is moderate.  7. 21 mm magna bioprosthetic valve in the aortic position (12/08/13), well-seated, no valvular stenosis or regurgitation (peak velocity 2.67m/s, MG , AVA 1.4cm2, AT<100 msec, DI 0.51).  8. Aortic dilatation noted. There is dilatation of the ascending aorta, measuring 40 mm.  9. The inferior vena cava is dilated in size with <50% respiratory variability, suggesting right atrial pressure of 15 mmHg. Comparison(s): A prior study was performed on 10/06/2022. Prior transmitral valve gradient (mean 10 mmHg) and transaortic gradient (mean ). FINDINGS  Left Ventricle: Left ventricular ejection fraction, by estimation, is >75%. The left ventricle has hyperdynamic function. The left ventricle has no regional wall motion abnormalities. The left ventricular internal cavity size was normal in size. There is moderate left ventricular hypertrophy. Left ventricular diastolic function could not be evaluated due to mitral valve replacement. Left ventricular diastolic function could not be evaluated. Right Ventricle: The right  ventricular size is mildly enlarged. Right vetricular wall thickness was not well visualized. Right ventricular systolic function reduced. There is severely elevated pulmonary artery systolic pressure. The tricuspid regurgitant  velocity is 3.50 m/s, and with an assumed right atrial pressure of 15 mmHg, the estimated right ventricular systolic pressure is 64.0 mmHg. Left Atrium: Left atrial size was mildly dilated. Right Atrium: Right atrial size was mildly dilated. Pericardium: There is no evidence of pericardial effusion. Mitral Valve: 25 mm [facility] Ease bioprosthetic valve in the mitral position (12/08/13), well-seated, no regurgitation, findings to suggest mitral valve stenosis (peak velocity 2.33m/s, MG , TVI ratio 3.5, EOA0.80cm2, HR 75bpm) but higher gradients can also be seen with increased flow through the mitral valve, increased HR, prosthesis-patient mismatch. Subvalvular calcification present. Clinical correlation required. MV peak gradient, 25.6 mmHg. The mean mitral valve gradient is 11.0 mmHg. Tricuspid Valve: The tricuspid valve is normal in structure. Tricuspid valve regurgitation is moderate . No evidence of tricuspid stenosis. Aortic Valve: 21 mm magna bioprosthetic valve in the aortic position (12/08/13), well-seated, no valvular stenosis or regurgitation (peak velocity 2.68m/s, MG , AVA 1.4cm2, AT<100 msec, DI 0.51). Aortic valve mean gradient measures 13.0 mmHg. Aortic valve peak gradient measures 20.8 mmHg. Aortic valve area, by VTI measures 1.44 cm. Pulmonic Valve: The pulmonic valve was not well visualized. Pulmonic valve regurgitation is mild. No evidence of pulmonic stenosis. Aorta: Aortic dilatation noted. There is dilatation of the ascending aorta, measuring 40 mm. Venous: The inferior vena cava is dilated in size with less than 50% respiratory variability, suggesting right atrial pressure of 15 mmHg. IAS/Shunts: The interatrial septum was not well visualized. Additional  Comments: A device lead is visualized.  LEFT VENTRICLE PLAX 2D LVIDd:         3.50 cm   Diastology LVIDs:         1.60 cm   LV e' medial:   3.70 cm/s LV PW:         1.40 cm   LV E/e' medial: 64.3 LV IVS:  1.40 cm LVOT diam:     1.90 cm LV SV:         59 LV SV Index:   34 LVOT Area:     2.84 cm  RIGHT VENTRICLE RV Basal diam:  4.50 cm RV Mid diam:    3.90 cm RV S prime:     7.83 cm/s TAPSE (M-mode): 1.4 cm LEFT ATRIUM             Index        RIGHT ATRIUM           Index LA Vol (A2C):   49.1 ml 28.44 ml/m  RA Area:     16.60 cm LA Vol (A4C):   74.3 ml 43.04 ml/m  RA Volume:   45.50 ml  26.35 ml/m LA Biplane Vol: 64.6 ml 37.42 ml/m  AORTIC VALVE AV Area (Vmax):    1.52 cm AV Area (Vmean):   1.32 cm AV Area (VTI):     1.44 cm AV Vmax:           228.00 cm/s AV Vmean:          170.000 cm/s AV VTI:            0.412 m AV Peak Grad:      20.8 mmHg AV Mean Grad:      13.0 mmHg LVOT Vmax:         122.00 cm/s LVOT Vmean:        79.400 cm/s LVOT VTI:          0.209 m LVOT/AV VTI ratio: 0.51  AORTA Ao Asc diam: 4.00 cm MITRAL VALVE                TRICUSPID VALVE MV Area (PHT): 2.55 cm     TR Peak grad:   49.0 mmHg MV Area VTI:   0.80 cm     TR Vmax:        350.00 cm/s MV Peak grad:  25.6 mmHg MV Mean grad:  11.0 mmHg    SHUNTS MV Vmax:       2.53 m/s     Systemic VTI:  0.21 m MV Vmean:      153.0 cm/s   Systemic Diam: 1.90 cm MV VTI:        0.74 m MV Decel Time: 298 msec MV E velocity: 238.00 cm/s MV A velocity: 152.00 cm/s MV E/A ratio:  1.57 Sunit Tolia Electronically signed by Olinda Bertrand Signature Date/Time: 08/28/2023/4:15:29 PM    Final     DG Chest Port 1 View Result Date: 08/27/2023 CLINICAL DATA:  Shortness of breath. EXAM: PORTABLE CHEST 1 VIEW COMPARISON:  Two-view chest x-ray 10/09/2022 FINDINGS: The heart is enlarged. Atherosclerotic calcifications are present at the aortic arch. Left greater than right pleural effusions have increased. Mild interstitial edema is present. Bibasilar airspace  opacities are noted, left greater than right. IMPRESSION: 1. Cardiomegaly and mild interstitial edema compatible with congestive heart failure. 2. Left greater than right pleural effusions. 3. Bibasilar airspace disease likely reflects atelectasis. Electronically Signed   By: Audree Leas [name]   On: 08/27/2023 11:14           Magnus Schuller, MD

## 2024-01-25 NOTE — Discharge Instructions (Signed)
 YOU HAD AN ENDOSCOPIC PROCEDURE TODAY: Refer to the procedure report and other information in the discharge instructions given to you for any specific questions about what was found during the examination. If this information does not answer your questions, please call [facility] office at [phone] to clarify.   YOU SHOULD EXPECT: Some feelings of bloating in the abdomen. Passage of more gas than usual. Walking can help get rid of the air that was put into your GI tract during the procedure and reduce the bloating. If you had a lower endoscopy (such as a colonoscopy or flexible sigmoidoscopy) you may notice spotting of blood in your stool or on the toilet paper. Some abdominal soreness may be present for a day or two, also.  DIET: Your first meal following the procedure should be a light meal and then it is ok to progress to your normal diet. A half-sandwich or bowl of soup is an example of a good first meal. Heavy or fried foods are harder to digest and may make you feel nauseous or bloated. Drink plenty of fluids but you should avoid alcoholic beverages for 24 hours. If you had a esophageal dilation, please see attached instructions for diet.    ACTIVITY: Your care partner should take you home directly after the procedure. You should plan to take it easy, moving slowly for the rest of the day. You can resume normal activity the day after the procedure however YOU SHOULD NOT DRIVE, use power tools, machinery or perform tasks that involve climbing or major physical exertion for 24 hours (because of the sedation medicines used during the test).   SYMPTOMS TO REPORT IMMEDIATELY: A gastroenterologist can be reached at any hour. Please call [phone]  for any of the following symptoms:  Following lower endoscopy (colonoscopy, flexible sigmoidoscopy) Excessive amounts of blood in the stool  Significant tenderness, worsening of abdominal pains  Swelling of the abdomen that is new, acute  Fever of 100 or  higher  Following upper endoscopy (EGD, EUS, ERCP, esophageal dilation) Vomiting of blood or coffee ground material  New, significant abdominal pain  New, significant chest pain or pain under the shoulder blades  Painful or persistently difficult swallowing  New shortness of breath  Black, tarry-looking or red, bloody stools  FOLLOW UP:  If any biopsies were taken you will be contacted by phone or by letter within the next 1-3 weeks. Call [phone]  if you have not heard about the biopsies in 3 weeks.  Please also call with any specific questions about appointments or follow up tests.

## 2024-01-25 NOTE — Transfer of Care (Signed)
 Immediate Anesthesia Transfer of Care Note  Patient: [name]  Procedure(s) Performed: ESOPHAGOGASTRODUODENOSCOPY (EGD) WITH PROPOFOL COLONOSCOPY WITH PROPOFOL POLYPECTOMY, INTESTINE  Patient Location: PACU  Anesthesia Type:MAC  Level of Consciousness: awake  Airway & Oxygen Therapy: Patient Spontanous Breathing  Post-op Assessment: Report given to RN and Post -op Vital signs reviewed and stable  Post vital signs: Reviewed and stable  Last Vitals:  Vitals Value Taken Time  BP    Temp    Pulse    Resp    SpO2      Last Pain:  Vitals:   01/25/24 0750  TempSrc: Temporal  PainSc: 0-No pain         Complications: No notable events documented.

## 2024-01-26 ENCOUNTER — Encounter (HOSPITAL_COMMUNITY): Payer: Self-pay | Admitting: Gastroenterology

## 2024-01-26 ENCOUNTER — Ambulatory Visit (INDEPENDENT_AMBULATORY_CARE_PROVIDER_SITE_OTHER): Payer: Medicare Other

## 2024-01-26 DIAGNOSIS — I442 Atrioventricular block, complete: Secondary | ICD-10-CM | POA: Diagnosis not present

## 2024-01-26 LAB — SURGICAL PATHOLOGY

## 2024-01-28 ENCOUNTER — Encounter: Payer: Self-pay | Admitting: Family Medicine

## 2024-01-28 LAB — CUP PACEART REMOTE DEVICE CHECK
Battery Remaining Longevity: 13 mo
Battery Remaining Percentage: 15 %
Battery Voltage: 2.81 V
Brady Statistic AP VP Percent: 82 %
Brady Statistic AP VS Percent: 1.8 %
Brady Statistic AS VP Percent: 16 %
Brady Statistic AS VS Percent: 1 %
Brady Statistic RA Percent Paced: 75 %
Brady Statistic RV Percent Paced: 98 %
Date Time Interrogation Session: 20250418040029
Implantable Lead Connection Status: 753985
Implantable Lead Connection Status: 753985
Implantable Lead Implant Date: 20170907
Implantable Lead Implant Date: 20170907
Implantable Lead Location: 753859
Implantable Lead Location: 753860
Implantable Pulse Generator Implant Date: 20170907
Lead Channel Impedance Value: 360 Ohm
Lead Channel Impedance Value: 480 Ohm
Lead Channel Pacing Threshold Amplitude: 0.75 V
Lead Channel Pacing Threshold Amplitude: 0.75 V
Lead Channel Pacing Threshold Pulse Width: 0.5 ms
Lead Channel Pacing Threshold Pulse Width: 0.5 ms
Lead Channel Sensing Intrinsic Amplitude: 2.4 mV
Lead Channel Sensing Intrinsic Amplitude: 8.7 mV
Lead Channel Setting Pacing Amplitude: 2 V
Lead Channel Setting Pacing Amplitude: 2 V
Lead Channel Setting Pacing Pulse Width: 0.5 ms
Lead Channel Setting Sensing Sensitivity: 2.5 mV
Pulse Gen Model: 2272
Pulse Gen Serial Number: 7945283

## 2024-01-29 ENCOUNTER — Encounter: Payer: Self-pay | Admitting: Cardiovascular Disease

## 2024-02-05 ENCOUNTER — Encounter: Payer: Self-pay | Admitting: Family Medicine

## 2024-02-05 ENCOUNTER — Ambulatory Visit (INDEPENDENT_AMBULATORY_CARE_PROVIDER_SITE_OTHER): Payer: Medicare Other | Admitting: Family Medicine

## 2024-02-05 VITALS — BP 166/90 | HR 75 | Temp 97.8°F | Ht 64.0 in | Wt 153.0 lb

## 2024-02-05 DIAGNOSIS — I5032 Chronic diastolic (congestive) heart failure: Secondary | ICD-10-CM

## 2024-02-05 DIAGNOSIS — R110 Nausea: Secondary | ICD-10-CM | POA: Diagnosis not present

## 2024-02-05 DIAGNOSIS — D539 Nutritional anemia, unspecified: Secondary | ICD-10-CM

## 2024-02-05 DIAGNOSIS — R5383 Other fatigue: Secondary | ICD-10-CM

## 2024-02-05 DIAGNOSIS — M545 Low back pain, unspecified: Secondary | ICD-10-CM

## 2024-02-05 DIAGNOSIS — R600 Localized edema: Secondary | ICD-10-CM

## 2024-02-05 DIAGNOSIS — W19XXXA Unspecified fall, initial encounter: Secondary | ICD-10-CM

## 2024-02-05 DIAGNOSIS — G8929 Other chronic pain: Secondary | ICD-10-CM

## 2024-02-05 MED ORDER — ACETAMINOPHEN 500 MG PO TABS
1000.0000 mg | ORAL_TABLET | Freq: Two times a day (BID) | ORAL | Status: DC
Start: 2024-02-05 — End: 2024-05-16

## 2024-02-05 NOTE — Patient Instructions (Addendum)
 Labs today  Hold iron to see if nausea improves and let me know.  Increase lasix  (furosemide ) to 40mg  twice daily for 3-5 days then back once daily with 2nd dose as needed for weight gain >3 lbs/day or 5 lbs>week, or leg swelling.  Take tylenol  1000mg  twice daily Try off trazodone .  May take extra tramadol  as needed for back pain - 1 -2 tablets twice daily as needed. Caution with constipation, nausea, sedation side effects. Return to see me in 4-6 weeks.  I will ask home health physical therapy to come out to the house. -

## 2024-02-05 NOTE — Progress Notes (Unsigned)
 Ph: [phone] Fax: [phone]   Patient ID: [name], female    DOB: [birth year], 78 y.o.   MRN: [id number]  This visit was conducted in person.  BP (!) 166/90   Pulse 75   Temp 97.8 F (36.6 C) (Oral)   Ht 5\' 4"  (1.626 m)   Wt 153 lb (69.4 kg)   SpO2 93%   BMI 26.26 kg/m   BP Readings from Last 3 Encounters:  02/05/24 (!) 166/90  01/25/24 (!) 148/80  01/05/24 121/77   CC: 3 mo DM f/u visit and "I fell yesterday" Subjective:   HPI: [name] is a 78 y.o. female presenting on 02/05/2024 for Medical Management of Chronic Issues (Here for 3 mo f/u. C/o ongoing nausea. ) and Fall (Pt fell backwards on 02/04/24 at home on concrete back porch. Injured L elbow and R hand. Check out by EMS but was not transported. )   DOI: 02/04/2024 Fall at home backwards onto concrete back porch. Was walking indoors from church. She did hit her left elbow and right hand. She bounced her head on back porch floor but does not have residual headache. No LOC. EMS called - evaluated her at home. No dizziness /lightheadedness.  Last outpatient PT session was ~6 months ago at [facility].  She would be interested in home therapy.   Recent GI eval with EGD and colonoscopy reassuring.  ESOPHAGOGASTRODUODENOSCOPY (EGD) WITH PROPOFOL  01/25/2024 - diffuse gastritis benign biopsy [place] Gillis, Havery Lions, MD)  COLONOSCOPY WITH PROPOFOL  01/25/2024 - TA, fair prep (consider repeat if needed), patent rectal anastomosis with congestion edema and stenosis, mild sigmoid diverticulosis [place] Gillis, Havery Lions, MD)  Weight gain noted - leg swelling noted. BP elevation noted.  Unclear cause of above.  No diet changes.  She is on eliquis  and lasix  40mg  daily. Has not taken any extra.  Ongoing waves of nausea. Phenergan  12.5mg  is helpful but she tries to avoid due to sedation. Has not tried zofran .   Saw Dr Scherrie Curt heme/onc for macrocytic anemia, thrombocytopenia, s/p reassuring bone marrow biopsy 08/2023. ?early  myelodysplasia. Monitoring Q4 months with upcoming appt 05/06/2024.   Worsening back pain - tramadol  50mg  BID is not effective.  Recent steroid shots to back have not been helpful. Sees Dr Ellery Guthrie.      Relevant past medical, surgical, family and social history reviewed and updated as indicated. Interim medical history since our last visit reviewed. Allergies and medications reviewed and updated. Outpatient Medications Prior to Visit  Medication Sig Dispense Refill   acidophilus (RISAQUAD) CAPS capsule Take 1 capsule by mouth daily.     albuterol  (VENTOLIN  HFA) 108 (90 Base) MCG/ACT inhaler Inhale 2 puffs into the lungs every 6 (six) hours as needed for wheezing. 18 g 6   ascorbic acid  (VITAMIN C) 500 MG tablet Take 1 tablet (500 mg total) by mouth daily.     Biotin 1000 MCG tablet Take 1,000 mcg by mouth daily.     buPROPion  (WELLBUTRIN  SR) 150 MG 12 hr tablet TAKE 1 TABLET(150 MG) BY MOUTH EVERY MORNING (Patient taking differently: Take 150 mg by mouth in the morning.) 90 tablet 1   calcium  carbonate (OSCAL) 1500 (600 Ca) MG TABS tablet Take 600 mg of elemental calcium  by mouth daily.     carboxymethylcellulose (REFRESH PLUS) 0.5 % SOLN Place 1 drop into both eyes 3 (three) times daily as needed (dry eyes).     cephALEXin  (KEFLEX ) 250 MG capsule Take 250 mg by mouth daily with breakfast.  cholecalciferol (VITAMIN D3) 25 MCG (1000 UNIT) tablet Take 1,000 Units by mouth daily.     docusate sodium  (COLACE) 100 MG capsule Take 200 mg by mouth daily.     ELIQUIS  5 MG TABS tablet TAKE 1 TABLET(5 MG) BY MOUTH TWICE DAILY (Patient taking differently: Take 5 mg by mouth in the morning and at bedtime.) 180 tablet 1   Ensifentrine  (OHTUVAYRE ) 3 MG/2.5ML SUSP Inhale into the lungs. Twice daily via neb     ferrous sulfate  325 (65 FE) MG EC tablet Take 1 tablet (325 mg total) by mouth daily with breakfast.     Fluticasone -Umeclidin-Vilant (TRELEGY ELLIPTA ) 200-62.5-25 MCG/ACT AEPB Inhale 1 puff into the  lungs daily. 60 each 11   furosemide  (LASIX ) 40 MG tablet TAKE 1 TABLET BY MOUTH EVERY DAY. MAY TAKE AN EXTRA TABLET AS NEEDED FOR SWELLING (Patient taking differently: Take 40 mg by mouth in the morning.) 135 tablet 2   gabapentin  (NEURONTIN ) 100 MG capsule Take 1 capsule (100 mg total) by mouth 2 (two) times daily as needed (back pain - for daytime use). (Patient taking differently: Take 100 mg by mouth daily.) 60 capsule 1   gabapentin  (NEURONTIN ) 300 MG capsule Take 300 mg by mouth at bedtime.     hydrocortisone  (ANUSOL -HC) 2.5 % rectal cream APPLY RECTALLY TO THE AFFECTED AREA TWICE DAILY 30 g 0   ipratropium-albuterol  (DUONEB) 0.5-2.5 (3) MG/3ML SOLN Take 3 mLs by nebulization every 6 (six) hours as needed. 360 mL 0   lisinopril  (ZESTRIL ) 5 MG tablet Take 1 tablet (5 mg total) by mouth daily. 90 tablet 1   lovastatin  (MEVACOR ) 40 MG tablet TAKE 1 TABLET(40 MG) BY MOUTH AT BEDTIME 90 tablet 2   metoprolol  succinate (TOPROL  XL) 25 MG 24 hr tablet Take 1 tablet (25 mg total) by mouth every evening. 90 tablet 3   montelukast  (SINGULAIR ) 10 MG tablet TAKE 1 TABLET(10 MG) BY MOUTH AT BEDTIME 90 tablet 1   Multiple Vitamins-Minerals (PRESERVISION AREDS 2 PO) Take 2 capsules by mouth in the morning.     omeprazole  (PRILOSEC) 20 MG capsule Take 1 capsule (20 mg total) by mouth daily. 90 capsule 3   ondansetron  (ZOFRAN -ODT) 4 MG disintegrating tablet Take 1 tablet (4 mg total) by mouth every 8 (eight) hours as needed for nausea or vomiting. 20 tablet 1   Phenazopyrid-Cranbry-C-Probiot (AZO URINARY TRACT SUPPORT PO) Take 1 capsule by mouth daily.     polyethylene glycol (MIRALAX  / GLYCOLAX ) packet Take 17 g by mouth daily. (Patient taking differently: Take 17 g by mouth daily as needed for mild constipation.) 14 each 0   potassium chloride  (KLOR-CON ) 10 MEQ tablet TAKE 2 TABLETS(20 MEQ) BY MOUTH DAILY (Patient taking differently: Take 20 mEq by mouth in the morning.) 180 tablet 1   promethazine   (PHENERGAN ) 12.5 MG tablet Take 1 tablet (12.5 mg total) by mouth every 8 (eight) hours as needed for nausea or vomiting.     psyllium (REGULOID) 0.52 g capsule Take 0.52 g by mouth in the morning and at bedtime.     SUFLAVE  178.7 g SOLR Take by mouth as directed.     traZODone  (DESYREL ) 50 MG tablet TAKE 1/2 TO 1 TABLET(25 TO 50 MG) BY MOUTH AT BEDTIME 30 tablet 5   Vibegron  (GEMTESA ) 75 MG TABS Take 75 mg by mouth daily.     vitamin B-12 (CYANOCOBALAMIN ) 500 MCG tablet Take 1 tablet (500 mcg total) by mouth 2 (two) times a week.  Zinc  50 MG CAPS Take 1 capsule (50 mg total) by mouth daily.  0   acetaminophen  (TYLENOL ) 500 MG tablet Take 2 tablets (1,000 mg total) by mouth daily. (Patient taking differently: Take 1,000 mg by mouth in the morning.)     traMADol  (ULTRAM ) 50 MG tablet Take 1 tablet (50 mg total) by mouth 2 (two) times daily as needed for moderate pain (pain score 4-6). 60 tablet 0   No facility-administered medications prior to visit.     Per HPI unless specifically indicated in ROS section below Review of Systems  Objective:  BP (!) 166/90   Pulse 75   Temp 97.8 F (36.6 C) (Oral)   Ht 5\' 4"  (1.626 m)   Wt 153 lb (69.4 kg)   SpO2 93%   BMI 26.26 kg/m   Wt Readings from Last 3 Encounters:  02/05/24 153 lb (69.4 kg)  01/25/24 148 lb (67.1 kg)  01/05/24 145 lb 14.4 oz (66.2 kg)      Physical Exam Vitals and nursing note reviewed.  Constitutional:      Appearance: Normal appearance. She is not ill-appearing.     Comments: Ambulates with walker   HENT:     Mouth/Throat:     Mouth: Mucous membranes are moist.     Pharynx: Oropharynx is clear. No oropharyngeal exudate or posterior oropharyngeal erythema.  Eyes:     Extraocular Movements: Extraocular movements intact.     Pupils: Pupils are equal, round, and reactive to light.  Cardiovascular:     Rate and Rhythm: Normal rate and regular rhythm.     Heart sounds: Murmur (3/6 systolic) heard.  Pulmonary:      Effort: Pulmonary effort is normal. No respiratory distress.     Breath sounds: No wheezing.     Comments:  Diminished breath sound bibasilarly Crackles bibasilarly Musculoskeletal:     Right lower leg: Edema (1+) present.     Left lower leg: Edema (1+) present.  Skin:    Findings: Bruising present.     Comments: Diffuse bruising throughout extremities  Abrasions to bilateral upper extremities  Psychiatric:        Mood and Affect: Mood normal.        Behavior: Behavior normal.       Lab Results  Component Value Date   NA 143 12/04/2023   CL 100 12/04/2023   K 4.2 12/04/2023   CO2 36 (H) 12/04/2023   BUN 18 12/04/2023   CREATININE 0.88 12/04/2023   GFR 62.94 12/04/2023   CALCIUM  8.7 12/04/2023   PHOS 3.7 06/21/2023   ALBUMIN  3.6 12/04/2023   GLUCOSE 94 12/04/2023    Lab Results  Component Value Date   WBC 8.1 01/05/2024   HGB 13.4 01/05/2024   HCT 42.5 01/05/2024   MCV 104.4 (H) 01/05/2024   PLT 75 (L) 01/05/2024    Lab Results  Component Value Date   TSH 0.701 08/30/2023     Assessment & Plan:   Problem List Items Addressed This Visit     Chronic lower back pain   Sees Dr Ellery Guthrie neurosurgery with latest steroid injections without benefit.  She continues tramadol  for pain control - finds 100mg  at a time is more helpful.  Ok to continue tramadol  50-100mg  BID PRN pain.       Relevant Medications   acetaminophen  (TYLENOL ) 500 MG tablet   traMADol  (ULTRAM ) 50 MG tablet   Other Relevant Orders   Ambulatory referral to Home Health   Macrocytic anemia  Chronic, followed by heme s/p reassuring bone marrow biopsy 08/2023.  Planned continue monitoring Q27mo.       Chronic heart failure with preserved ejection fraction (HFpEF) (HCC)   Concern for exacerbation of CHF given weight gain pulmonary crackles and leg swelling noted. Increase lasix  to 40mg  BID x 3-5 days then reassess.       Relevant Orders   Ambulatory referral to Home Health   Nausea   Ongoing  for months. Completed overall reassuring GI evaluation with EGD and colonoscopy (diffuse gastritis, rectal anastomosis with congestion and edema).  She is on oral iron - recommend stop this as it could contribute to nausea.  Continue PRN phenergan . She has not recently used zofran .       Pedal edema   Progressive leg swelling noted with some weeping from abrasion to LLE (cat scratch).  Rx lasix  40mg  BID x 3-5 days and as needed for weight gain or leg swelling then return to every day dosing.  Significant bruising to bilateral extremities also present - in eliquis  use.       Relevant Orders   Comprehensive metabolic panel with GFR   Brain natriuretic peptide   Fatigue   Notes excess sedation.  Suggested trial off trazodone  as she's not having significant difficulty with sleep otherwise.       Fall with injury - Primary   Again fell at home - lost balance while walking into house.  Multiple abrasions throughout extremities.  She did hit her head but without any signs of head laceration, abrasion, or ongoing headache to suggest complication from eliquis  use.   Last outpatient PT course was mid 2024.  Offered PT - will order home health evaluation given she is at increased risk of falls.       Relevant Orders   Ambulatory referral to Home Health     Meds ordered this encounter  Medications   acetaminophen  (TYLENOL ) 500 MG tablet    Sig: Take 2 tablets (1,000 mg total) by mouth in the morning and at bedtime.   traMADol  (ULTRAM ) 50 MG tablet    Sig: Take 1-2 tablets (50-100 mg total) by mouth 2 (two) times daily as needed for moderate pain (pain score 4-6).    Dispense:  90 tablet    Refill:  0    Orders Placed This Encounter  Procedures   Comprehensive metabolic panel with GFR   Brain natriuretic peptide   Ambulatory referral to Home Health    Referral Priority:   Routine    Referral Type:   Home Health Care    Referral Reason:   Specialty Services Required    Requested  Specialty:   Home Health Services    Number of Visits Requested:   1    Patient Instructions  Labs today  Hold iron to see if nausea improves and let me know.  Increase lasix  (furosemide ) to 40mg  twice daily for 3-5 days then back once daily with 2nd dose as needed for weight gain >3 lbs/day or 5 lbs>week, or leg swelling.  Take tylenol  1000mg  twice daily Try off trazodone .  May take extra tramadol  as needed for back pain - 1 -2 tablets twice daily as needed. Caution with constipation, nausea, sedation side effects. Return to see me in 4-6 weeks.  I will ask home health physical therapy to come out to the house. -  Follow up plan: No follow-ups on file.  Claire Crick, MD

## 2024-02-06 LAB — COMPREHENSIVE METABOLIC PANEL WITH GFR
ALT: 12 U/L (ref 0–35)
AST: 18 U/L (ref 0–37)
Albumin: 3.5 g/dL (ref 3.5–5.2)
Alkaline Phosphatase: 66 U/L (ref 39–117)
BUN: 17 mg/dL (ref 6–23)
CO2: 35 meq/L — ABNORMAL HIGH (ref 19–32)
Calcium: 9.5 mg/dL (ref 8.4–10.5)
Chloride: 102 meq/L (ref 96–112)
Creatinine, Ser: 0.84 mg/dL (ref 0.40–1.20)
GFR: 66.47 mL/min (ref >60.00)
Glucose, Bld: 82 mg/dL (ref 70–99)
Potassium: 4.7 meq/L (ref 3.5–5.1)
Sodium: 142 meq/L (ref 135–145)
Total Bilirubin: 0.6 mg/dL (ref 0.2–1.2)
Total Protein: 6.7 g/dL (ref 6.0–8.3)

## 2024-02-06 LAB — BRAIN NATRIURETIC PEPTIDE: Pro B Natriuretic peptide (BNP): 754 pg/mL — ABNORMAL HIGH (ref 0.0–100.0)

## 2024-02-06 MED ORDER — TRAMADOL HCL 50 MG PO TABS: 50.0000 mg | ORAL_TABLET | Freq: Two times a day (BID) | ORAL | 0 refills | Status: DC | PRN

## 2024-02-06 NOTE — Assessment & Plan Note (Signed)
 Ongoing for months. Completed overall reassuring GI evaluation with EGD and colonoscopy (diffuse gastritis, rectal anastomosis with congestion and edema).  She is on oral iron - recommend stop this as it could contribute to nausea.  Continue PRN phenergan . She has not recently used zofran .

## 2024-02-06 NOTE — Assessment & Plan Note (Signed)
 Sees Dr Ellery Guthrie neurosurgery with latest steroid injections without benefit.  She continues tramadol  for pain control - finds 100mg  at a time is more helpful.  Ok to continue tramadol  50-100mg  BID PRN pain.

## 2024-02-06 NOTE — Assessment & Plan Note (Signed)
 Concern for exacerbation of CHF given weight gain pulmonary crackles and leg swelling noted. Increase lasix  to 40mg  BID x 3-5 days then reassess.

## 2024-02-06 NOTE — Assessment & Plan Note (Signed)
 Chronic, followed by heme s/p reassuring bone marrow biopsy 08/2023.  Planned continue monitoring Q49mo.

## 2024-02-06 NOTE — Assessment & Plan Note (Addendum)
 Again fell at home - lost balance while walking into house.  Multiple abrasions throughout extremities.  She did hit her head but without any signs of head laceration, abrasion, or ongoing headache to suggest complication from eliquis  use.   Last outpatient PT course was mid 2024.  Offered PT - will order home health evaluation given she is at increased risk of falls.

## 2024-02-06 NOTE — Assessment & Plan Note (Addendum)
 Progressive leg swelling noted with some weeping from abrasion to LLE (cat scratch).  Rx lasix  40mg  BID x 3-5 days and as needed for weight gain or leg swelling then return to every day dosing.  Significant bruising to bilateral extremities also present - in eliquis  use.

## 2024-02-06 NOTE — Assessment & Plan Note (Addendum)
 Notes excess sedation.  Suggested trial off trazodone  as she's not having significant difficulty with sleep otherwise.

## 2024-02-08 ENCOUNTER — Encounter: Payer: Self-pay | Admitting: Family Medicine

## 2024-02-09 ENCOUNTER — Other Ambulatory Visit: Payer: Self-pay | Admitting: Cardiovascular Disease

## 2024-02-21 DIAGNOSIS — I4891 Unspecified atrial fibrillation: Secondary | ICD-10-CM

## 2024-02-21 DIAGNOSIS — I5032 Chronic diastolic (congestive) heart failure: Secondary | ICD-10-CM | POA: Diagnosis not present

## 2024-02-21 DIAGNOSIS — I130 Hypertensive heart and chronic kidney disease with heart failure and stage 1 through stage 4 chronic kidney disease, or unspecified chronic kidney disease: Secondary | ICD-10-CM | POA: Diagnosis not present

## 2024-02-21 DIAGNOSIS — M47816 Spondylosis without myelopathy or radiculopathy, lumbar region: Secondary | ICD-10-CM | POA: Diagnosis not present

## 2024-02-21 DIAGNOSIS — G629 Polyneuropathy, unspecified: Secondary | ICD-10-CM

## 2024-02-21 DIAGNOSIS — M810 Age-related osteoporosis without current pathological fracture: Secondary | ICD-10-CM

## 2024-02-21 DIAGNOSIS — D696 Thrombocytopenia, unspecified: Secondary | ICD-10-CM

## 2024-02-21 DIAGNOSIS — M8929 Other disorders of bone development and growth, multiple sites: Secondary | ICD-10-CM | POA: Diagnosis not present

## 2024-02-21 DIAGNOSIS — I4892 Unspecified atrial flutter: Secondary | ICD-10-CM

## 2024-02-21 DIAGNOSIS — D539 Nutritional anemia, unspecified: Secondary | ICD-10-CM

## 2024-02-21 DIAGNOSIS — J449 Chronic obstructive pulmonary disease, unspecified: Secondary | ICD-10-CM

## 2024-02-21 DIAGNOSIS — N1832 Chronic kidney disease, stage 3b: Secondary | ICD-10-CM

## 2024-02-29 ENCOUNTER — Encounter: Payer: Self-pay | Admitting: Cardiovascular Disease

## 2024-02-29 ENCOUNTER — Ambulatory Visit: Attending: Cardiovascular Disease | Admitting: Cardiovascular Disease

## 2024-02-29 VITALS — BP 139/79 | HR 77 | Ht 64.0 in | Wt 147.4 lb

## 2024-02-29 DIAGNOSIS — E7800 Pure hypercholesterolemia, unspecified: Secondary | ICD-10-CM

## 2024-02-29 DIAGNOSIS — T82857D Stenosis of cardiac prosthetic devices, implants and grafts, subsequent encounter: Secondary | ICD-10-CM | POA: Diagnosis not present

## 2024-02-29 DIAGNOSIS — I480 Paroxysmal atrial fibrillation: Secondary | ICD-10-CM

## 2024-02-29 DIAGNOSIS — I7121 Aneurysm of the ascending aorta, without rupture: Secondary | ICD-10-CM

## 2024-02-29 DIAGNOSIS — I495 Sick sinus syndrome: Secondary | ICD-10-CM

## 2024-02-29 DIAGNOSIS — I5032 Chronic diastolic (congestive) heart failure: Secondary | ICD-10-CM | POA: Diagnosis not present

## 2024-02-29 DIAGNOSIS — I442 Atrioventricular block, complete: Secondary | ICD-10-CM

## 2024-02-29 DIAGNOSIS — Z87898 Personal history of other specified conditions: Secondary | ICD-10-CM

## 2024-02-29 DIAGNOSIS — I2510 Atherosclerotic heart disease of native coronary artery without angina pectoris: Secondary | ICD-10-CM

## 2024-02-29 DIAGNOSIS — Z953 Presence of xenogenic heart valve: Secondary | ICD-10-CM

## 2024-02-29 DIAGNOSIS — I700 Atherosclerosis of aorta: Secondary | ICD-10-CM

## 2024-02-29 DIAGNOSIS — J449 Chronic obstructive pulmonary disease, unspecified: Secondary | ICD-10-CM

## 2024-02-29 DIAGNOSIS — Z950 Presence of cardiac pacemaker: Secondary | ICD-10-CM

## 2024-02-29 DIAGNOSIS — D6869 Other thrombophilia: Secondary | ICD-10-CM

## 2024-02-29 NOTE — Patient Instructions (Signed)
 Medication Instructions:  Your physician recommends that you continue on your current medications as directed. Please refer to the Current Medication list given to you today.  *If you need a refill on your cardiac medications before your next appointment, please call your pharmacy*  Lab Work: NONE If you have labs (blood work) drawn today and your tests are completely normal, you will receive your results only by: MyChart Message (if you have MyChart) OR A paper copy in the mail If you have any lab test that is abnormal or we need to change your treatment, we will call you to review the results.  Testing/Procedures: NONE  Follow-Up: At [facility], you and your health needs are our priority.  As part of our continuing mission to provide you with exceptional heart care, our providers are all part of one team.  This team includes your primary Cardiologist (physician) and Advanced Practice Providers or APPs (Physician Assistants and Nurse Practitioners) who all work together to provide you with the care you need, when you need it.  Your next appointment:   6 month(s)  Provider:   You may see DR. CROITORU or one of the following Advanced Practice Providers on your designated Care Team:   Mertha Abrahams, Kennard Pea [address]" [place], PA-C Suzann Riddle, NP Creighton Doffing, NP    We recommend signing up for the patient portal called "MyChart".  Sign up information is provided on this After Visit Summary.  MyChart is used to connect with patients for Virtual Visits (Telemedicine).  Patients are able to view lab/test results, encounter notes, upcoming appointments, etc.  Non-urgent messages can be sent to your provider as well.   To learn more about what you can do with MyChart, go to [email or web address].

## 2024-02-29 NOTE — Progress Notes (Unsigned)
 Cardiology office note   Date:  03/04/2024   ID:  [name], [place] October 22, 1945, MRN [id number] PCP:  Claire Crick, MD  Cardiologist:  Luana Rumple, MD  Electrophysiologist:  None   Evaluation Performed:  Follow-Up Visit  Chief Complaint: CHF, AFib  History of Present Illness:    [name] is a 78 y.o. 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  She feels very tired and has declining functional abilities, although she denies shortness of breath.  She has not had any recent bleeding problems.  Denies chest pain.  Continues to be limited by back pain as well due to DISH.  Still living independently.    Comprehensive pacemaker check shows normal device function.  All lead parameters are normal range.  Estimated generator battery longevity is 1 year.  Presenting rhythm was AV sequential pacing.  Underlying rhythm is sinus bradycardia at about 46 bpm.  Not really pacemaker dependent, but requiring almost 100% ventricular pacing.  She has 75% atrial pacing and 98% ventricular pacing.  The burden of atrial fibrillation has been around 8%.  Last November she had an episode of persistent atrial fibrillation lasting for almost 2 weeks after which she underwent successful cardioversion.  In early April of this year she again had about 10 days of atrial fibrillation but converted spontaneously to normal rhythm.  Since then she has had a handful of brief episodes  of paroxysmal atrial fibrillation lasting a few hours.  She is not aware of the arrhythmia.  Her most recent echocardiogram was the TEE performed in November 2024 before her cardioversion.  LVEF was 60 to 65%, the right ventricle is mildly enlarged but had normal systolic function.  The pulmonary artery is also moderately dilated.  Both atria were moderately dilated.  There was mild-moderate mitral stenosis (25 mm [facility] Ease prosthetic valve with a mean gradient of 7 mmHg at a heart rate of 60 bpm.  This gradient was improved compared to the studies in January and December 2023 and back to the baseline from 2015 (when it was 6 mmHg).  Normal function of the aortic valve prosthesis with a mean gradient of 12 mmHg.  proBNP 02/05/2024 was elevated at 754, compared to 379 in 2021.  In August 2020 she had transient worsening mitral stenosis due to leaflet thrombosis, resolved after Xarelto  was switched to warfarin.  She had a lot of problems with easy bleeding and bruising on warfarin, as well as highly erratic INR, rarely kept in normal range.  In February 2022 we switched from warfarin to Eliquis .  Was started on Ohtuvayre  for her COPD but she only remembers to take it about every other day.  She is not sure it is helping.  She has a remote history of rectal cancer treated with partial colectomy as well as radiation and slow down.  Underwent recent endoscopy and colonoscopy without any major pathology identified.  Had a bone marrow biopsy due to mild pancytopenia with macrocytosis that did not show any serious pathology, potential early myelodysplasia although  she had a negative MDS FISH panel.  Most recent hemoglobin 13.4 with an MCV of 104 and platelet count 75K normal WBC 8.1 with moderate lymphopenia.  She has had repeated episodes of transient partial visual field cut in her left eye, lasting for a few minutes at a time.  Usually its the bottom half of her visual field that she loses.  Initial  concern that this may be related to thromboembolic events is felt to likely anymore, since the episodes are repetitive and stereotypical always in the same distribution.  Her previous ophthalmological evaluation also "did not show anything wrong".  She had a CT myelogram on May 08 2019.  There was severe left foraminal stenosis at L3-4 as well as moderate left foraminal stenosis at C4-5-6.  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   She had her most recent echocardiogram 10/13/2021.  This continues to show normal left ventricular systolic function and stable aortic valve prosthesis parameters.  The mean mitral gradient is slightly higher at 9.7 mmHg (up from 7 mmHg), but the mitral valve deceleration time is actually little lower at 338 ms (previously 392 ms).  This suggests that the increased gradients may be due to a change in cardiac output/stroke-volume, rather than a true deterioration in mitral valve performance.  She has a very mild ascending aortic aneurysm that has been stable in size, most recently 4.2 cm on 08/16/2021.  She does have scattered aortic atherosclerosis.  On this last scan there appeared to be improvement in the areas of bronchiectasis/groundglass opacities represent MAC infection.    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 Known DISH with thoracic spine fusion and moderate kyphosis, h/o L spine fusion L2-5   Adjustment disorder with depressed mood 10-23-2014   32 yo son died MI 09-17-14 Husband died car accident 03-19-15   Anemia    Anticoagulant long-term use 06/14/2019   She takes warfarin for afib/flutter with CHADS2VASc score of 4 (age, sex, CHF, HTN), s/p 2 bioprosthetic valve replacements Coumadin  changed to eliquis  03/18/2021 per cardiology    Aortic atherosclerosis 03/03/2022   Noted on 01/2022 chest CT   Arthritis    Arthropathy of spinal facet joint 03/08/2018   Ascending aortic aneurysm 03/02/2021   4.1cm on CT 01/2021 rec yearly monitoring   Bilateral hip pain 09/08/2016   Cancer (HCC)    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 Myelogram showed significant left sided foraminal stenosis at L2/3 and L3/4 in setting of solid arthrodesis, planned DEXA and if stable osteopenia, considering anterolateral decompression with spacer.    CKD (chronic kidney disease) stage 2, GFR 60-89 ml/min 07/03/2015   Constipation    takes Miralax  daily as needed   COPD (chronic  obstructive pulmonary disease)    Albuterol  inhaler daily as needed;Duoneb daily as needed;Spiriva  daily   COVID-19 virus infection 11/11/2022   DDD (degenerative disc disease)    cervical - kyphosis with mod DD changes C5/6 and C6/7; lumbar - early DD at L2/3 (Elsner)   Depression    DISH (diffuse idiopathic skeletal hyperostosis) 05/14/2019   By MRI noted fusion of lower thoracic vertebrae - likely contributing to unsteadiness (Elsner)   Dyspnea on exertion 06/05/2015   myoview  low risk 02/2018   Edema of both lower legs due to peripheral venous insufficiency    Essential hypertension, benign 03/24/2009   hx of not on nmeds currenlty and has been a while since on meds per pt   Heart murmur    Hematuria 06/17/2021   History of kidney  stones    History of radiation therapy 05/30/11 to 07/07/11   rectum   History of rectal cancer 05/26/2011   Rectal cancer 2012, midrectum [id number], s/p neoadj chemo&XRT, lap LAR with diverting loop ileostomy 08/2011, ileostomy takedown 03/2012 Discharged from oncology clinic 2017 Scherrie Curt)   History of shingles    History of vertebral fracture 11/26/2019   Old L1 vertebral compression fracture incidentally noted on imaging 2020    Hyperlipemia    takes Lovastatin  daily   Lacunar infarction    Left caudate   Left hip pain 07/29/2020   Legally blind in left eye    Lymphangioma 01/07/2021   Noted incidentally on carotid US  12/2020 CT neck - Likely benign lymphangioma 01/2021   Macrocytosis 03/27/2022   Folate and b12 checked 2023   Malaise and fatigue 01/22/2018   Meralgia paresthetica 12/13/2007   Mixed stress and urge urinary incontinence 03/16/2015   Failed oxybutynin  IR/ER. Vesicare  initially helped but then no longer helpful.  myrbetriq  was not effective.  Saw urology (MacDiarmid) - UDS largely overactive bladder with mild-mod stress incontinence. Improved on abx course - maintained on macrodantin  and toviaz .    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   Pulmonary infiltrates 04/23/2019   06/13/2018-CT super D chest without contrast- generally stable chronic lung disease with pleural parenchymal scarring and scattered nodularity most of these nodules have been tree-in-bud in appearance likely postinfectious or inflammatory the dominant right lower lobe nodule seen on most recent study has resolved no new or enlarging nodules  04/22/2019-CT chest without contrast- waxing and waning per   Pulmonary nodule 02/23/2012   RLL nodule-7mm stable 2006, April 2009, and June 2009 Small pulm nodules Jan 2012 - > Oct 2012 without change   Rheumatic heart disease mitral stenosis    mod MS  by echo 02/2014   Right leg swelling 06/11/2020   R venous US  06/2020 WNL: - No evidence of deep vein thrombosis in the lower extremity. No indirect evidence of obstruction proximal to the inguinal ligament. - No cystic structure found in the popliteal fossa.   S/P aortic and mitral valve bioprostheses 12/08/2013   AVR 21 mm, MVR 25 mm-MagnaEase pericardia   S/P AVR (aortic valve replacement) 2015   bioprosthetic (Bartle)   S/P MVR (mitral valve replacement) 2015   bioprosthetic (Bartle)   Sepsis secondary to UTI (HCC) 08/22/2017   Severe aortic valve stenosis 10/01/2013   mild-mod by echo 02/2014   Skin lesion 01/03/2022   Small bowel obstruction, partial 08/2013   reolved without NGT placement.    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   Dr Amy Swaziland   Syncope 05/28/2018   Thrombocytopenia 11/04/2019   Typical atrial flutter 05/15/2019   Vitamin B12 deficiency 02/28/2015   Start B12 shots 02/2015    Vitamin D  deficiency 10/23/2016   Wounds, multiple    bilateral legs   Past Surgical History:  Procedure Laterality Date   ANTERIOR LAT LUMBAR FUSION Left  07/17/2018   Procedure: Lumbar Two-Three Lumbar Three-Four Anterolateral decompression/interbody fusion with lateral plate fixation/Infuse;  Surgeon: Elna Haggis, MD;  Location: [facility] OR;  Service: Neurosurgery;  Laterality: Left;  Lumbar Two-Three Lumbar Three-Four Anterolateral decompression/interbody fusion with lateral plate fixation/Infuse   AORTIC VALVE REPLACEMENT N/A 12/12/2013   Procedure: AORTIC VALVE REPLACEMENT (AVR);  Surgeon: Bartley Lightning, MD; Service: Open Heart Surgery   BACK SURGERY  2006, 2007   2006 SPACER, 2007 decompression and fusion L4/5   BOWEL RESECTION  04/02/2012   Procedure: SMALL BOWEL RESECTION;  Surgeon: Lockie Rima, MD;  Location: [facility] ORS;  Service: General;  Laterality: N/A;   BRONCHIAL WASHINGS  03/24/2021   Procedure: BRONCHIAL WASHINGS;  Surgeon: [name], MD;  Location: [facility] ENDOSCOPY;  Service: Endoscopy;;   CARDIOVERSION N/A 08/29/2023   Procedure: CARDIOVERSION (CATH LAB);  Surgeon: Lenise Quince, MD;  Location: [facility] INVASIVE CV LAB;  Service: Cardiovascular;  Laterality: N/A;   CARPAL TUNNEL RELEASE Bilateral    CATARACT EXTRACTION W/ INTRAOCULAR LENS IMPLANT Left 10/18/2016   CATARACT EXTRACTION W/ INTRAOCULAR LENS IMPLANT Right 12/13/2016   Dr. Matthew Songster   CHOLECYSTECTOMY N/A 01/21/2015   chronic cholecystitis, Lockie Rima, MD   COLON RESECTION  08/25/2011   Procedure: COLON RESECTION LAPAROSCOPIC;  Surgeon: Lockie Rima, MD;  Location: [facility];  Service: General;  Laterality: N/A;  Laparoscopic Assisted Low Anterior Resection Diverting Ostomy and onQ pain pump   COLONOSCOPY  03/2013   1 polyp, rpt 3 yrs Howard Macho)   COLONOSCOPY  05/2021   diverticulosis and patent colon anastomosis with some edema/narrowing, rpt 5 yrs Howard Macho)   COLONOSCOPY WITH PROPOFOL  N/A 06/09/2016   patent colo-colonic anastomosis, rpt 5 yrs Howard Macho)   COLONOSCOPY WITH PROPOFOL  N/A 01/25/2024   TA, fair prep (consider repeat if needed), patent rectal anastomosis  with congestion edema and stenosis, mild sigmoid diverticulosis Lajuan Pila, MD)   CYSTOSCOPY/URETEROSCOPY/HOLMIUM LASER/STENT PLACEMENT Right 07/03/2023   Procedure: CYSTOSCOPY RIGHT RETROGRADE PYEPLGRAM RIGHT URETEROSCOPY/HOLMIUM LASER/STENT PLACEMENT;  Surgeon: Samson Croak, MD;  Location: [facility];  Service: Urology;  Laterality: Right;  1 HR FOR CASE   CYSTOSCOPY/URETEROSCOPY/HOLMIUM LASER/STENT PLACEMENT Right 07/17/2023   Procedure: CYSTOSCOPY/RIGHT URETEROSCOPY/STENT EXCHANGE;  Surgeon: Samson Croak, MD;  Location: [facility];  Service: Urology;  Laterality: Right;  60 MINS FOR  CASE   EP IMPLANTABLE DEVICE N/A 06/16/2016   Procedure: Pacemaker Implant;  Surgeon: Will Cortland Ding, MD;  Location: [facility] INVASIVE CV LAB;  Service: Cardiovascular;  Laterality: N/A;   ESOPHAGOGASTRODUODENOSCOPY (EGD) WITH PROPOFOL  N/A 01/25/2024   diffuse gastritis benign biopsy Venice Gillis, Havery Lions, MD)   FOOT SURGERY  left foot   hammer toe   ILEOSTOMY  08/25/2011   ILEOSTOMY CLOSURE  04/02/2012   Procedure: ILEOSTOMY TAKEDOWN;  Surgeon: Lockie Rima, MD;  Location: [facility];  Service: General;  Laterality: N/A;   INTRAOPERATIVE TRANSESOPHAGEAL ECHOCARDIOGRAM N/A 12/12/2013   Procedure: INTRAOPERATIVE TRANSESOPHAGEAL ECHOCARDIOGRAM;  Surgeon: Bartley Lightning, MD;  Location: [facility] OR;  Service: Open Heart Surgery;  Laterality: N/A;   LEFT AND RIGHT HEART CATHETERIZATION WITH CORONARY ANGIOGRAM N/A 11/27/2013   Procedure: LEFT AND RIGHT HEART CATHETERIZATION WITH CORONARY ANGIOGRAM;  Surgeon: Luana Rumple, MD;  Location: [facility] CATH LAB;  Service: Cardiovascular;  Laterality: N/A;   MITRAL VALVE REPLACEMENT N/A 12/12/2013   Procedure: MITRAL VALVE (MV) REPLACEMENT OR REPAIR;  Surgeon: Bartley Lightning, MD; Service: Open Heart Surgery   POLYPECTOMY  01/25/2024   Procedure: POLYPECTOMY, INTESTINE;  Surgeon: Lajuan Pila, MD;  Location: [facility] END[facility];  Service: Gastroenterology;;   TEE WITHOUT CARDIOVERSION N/A  11/29/2013   Procedure: TRANSESOPHAGEAL ECHOCARDIOGRAM (TEE);  Surgeon: Jacqueline Matsu, MD;  Location: [facility] ENDOSCOPY;  Service: Cardiovascular;  Laterality: N/A;   TEE WITHOUT CARDIOVERSION N/A 06/04/2019   Procedure: TRANSESOPHAGEAL ECHOCARDIOGRAM (TEE);  Surgeon: Luana Rumple, MD;  Location: [facility] ENDOSCOPY;  Service: Cardiovascular;  Laterality: N/A;   TONSILLECTOMY     TRANSESOPHAGEAL ECHOCARDIOGRAM (CATH LAB) N/A 08/29/2023   Procedure: TRANSESOPHAGEAL ECHOCARDIOGRAM;  Surgeon: Lenise Quince, MD;  Location: [facility] INVASIVE CV LAB;  Service: Cardiovascular;  Laterality: N/A;   VIDEO BRONCHOSCOPY N/A 03/24/2021   Procedure: VIDEO BRONCHOSCOPY WITHOUT FLUORO;  Surgeon: [name], MD;  Location: [facility] ENDOS[facility];  Service: Endoscopy;  Laterality: N/A;  SMALL SCOPE,PREMEDICATE WITH NEBULIZED LIDOCAINE      Current Meds  Medication Sig   acetaminophen  (TYLENOL ) 500 MG tablet Take 2 tablets (1,000 mg total) by mouth in the morning and at bedtime.   acidophilus (RISAQUAD) CAPS capsule Take 1 capsule by mouth daily.   albuterol  (VENTOLIN  HFA) 108 (90 Base) MCG/ACT inhaler Inhale 2 puffs into the lungs every 6 (six) hours as needed for wheezing.   ascorbic acid  (VITAMIN C) 500 MG tablet Take 1 tablet (500 mg total) by mouth daily.   Biotin 1000 MCG tablet Take 1,000 mcg by mouth daily.   buPROPion  (WELLBUTRIN  SR) 150 MG 12 hr tablet TAKE 1 TABLET(150 MG) BY MOUTH EVERY MORNING (Patient taking differently: Take 150 mg by mouth in the morning.)   calcium  carbonate (OSCAL) 1500 (600 Ca) MG TABS tablet Take 600 mg of elemental calcium  by mouth daily.   carboxymethylcellulose (REFRESH PLUS) 0.5 % SOLN Place 1 drop into both eyes 3 (three) times daily as needed (dry eyes).   cephALEXin  (KEFLEX ) 250 MG capsule Take 250 mg by mouth daily with breakfast.   cholecalciferol (VITAMIN D3) 25 MCG (1000 UNIT) tablet Take 1,000 Units by mouth daily.   docusate sodium  (COLACE) 100 MG capsule Take 200 mg by mouth  daily.   ELIQUIS  5 MG TABS tablet TAKE 1 TABLET(5 MG) BY MOUTH TWICE DAILY (Patient taking differently: Take 5 mg by mouth in the morning and at bedtime.)   Ensifentrine  (OHTUVAYRE ) 3 MG/2.5ML SUSP Inhale into the lungs. Twice daily via neb   ferrous sulfate  325 (65 FE)  MG EC tablet Take 1 tablet (325 mg total) by mouth daily with breakfast.   Fluticasone -Umeclidin-Vilant (TRELEGY ELLIPTA ) 200-62.5-25 MCG/ACT AEPB Inhale 1 puff into the lungs daily.   furosemide  (LASIX ) 40 MG tablet TAKE 1 TABLET BY MOUTH EVERY DAY. MAY TAKE AN EXTRA TABLET AS NEEDED FOR SWELLING (Patient taking differently: Take 40 mg by mouth in the morning.)   gabapentin  (NEURONTIN ) 100 MG capsule Take 1 capsule (100 mg total) by mouth 2 (two) times daily as needed (back pain - for daytime use). (Patient taking differently: Take 100 mg by mouth daily.)   gabapentin  (NEURONTIN ) 300 MG capsule Take 300 mg by mouth at bedtime.   hydrocortisone  (ANUSOL -HC) 2.5 % rectal cream APPLY RECTALLY TO THE AFFECTED AREA TWICE DAILY   ipratropium-albuterol  (DUONEB) 0.5-2.5 (3) MG/3ML SOLN Take 3 mLs by nebulization every 6 (six) hours as needed.   lovastatin  (MEVACOR ) 40 MG tablet TAKE 1 TABLET(40 MG) BY MOUTH AT BEDTIME   metoprolol  succinate (TOPROL  XL) 25 MG 24 hr tablet Take 1 tablet (25 mg total) by mouth every evening.   montelukast  (SINGULAIR ) 10 MG tablet TAKE 1 TABLET(10 MG) BY MOUTH AT BEDTIME   Multiple Vitamins-Minerals (PRESERVISION AREDS 2 PO) Take 2 capsules by mouth in the morning.   omeprazole  (PRILOSEC) 20 MG capsule Take 1 capsule (20 mg total) by mouth daily.   ondansetron  (ZOFRAN -ODT) 4 MG disintegrating tablet Take 1 tablet (4 mg total) by mouth every 8 (eight) hours as needed for nausea or vomiting.   Phenazopyrid-Cranbry-C-Probiot (AZO URINARY TRACT SUPPORT PO) Take 1 capsule by mouth daily.   polyethylene glycol (MIRALAX  / GLYCOLAX ) packet Take 17 g by mouth daily. (Patient taking differently: Take 17 g by mouth daily  as needed for mild constipation.)   potassium chloride  (KLOR-CON ) 10 MEQ tablet TAKE 2 TABLETS(20 MEQ) BY MOUTH DAILY   promethazine  (PHENERGAN ) 12.5 MG tablet Take 1 tablet (12.5 mg total) by mouth every 8 (eight) hours as needed for nausea or vomiting.   psyllium (REGULOID) 0.52 g capsule Take 0.52 g by mouth in the morning and at bedtime.   SUFLAVE  178.7 g SOLR Take by mouth as directed.   traMADol  (ULTRAM ) 50 MG tablet Take 1-2 tablets (50-100 mg total) by mouth 2 (two) times daily as needed for moderate pain (pain score 4-6).   traZODone  (DESYREL ) 50 MG tablet TAKE 1/2 TO 1 TABLET(25 TO 50 MG) BY MOUTH AT BEDTIME   Vibegron  (GEMTESA ) 75 MG TABS Take 75 mg by mouth daily.   vitamin B-12 (CYANOCOBALAMIN ) 500 MCG tablet Take 1 tablet (500 mcg total) by mouth 2 (two) times a week.   Zinc  50 MG CAPS Take 1 capsule (50 mg total) by mouth daily.     Allergies:   Tape, Codeine, Prednisone , and Sulfa antibiotics   Social History   Tobacco Use   Smoking status: Former    Current packs/day: 0.00    Average packs/day: 1.5 packs/day for 10.0 years (15.0 ttl pk-yrs)    Types: Cigarettes    Start date: 12/27/1964    Quit date: 12/28/1974    Years since quitting: 49.2   Smokeless tobacco: Never   Tobacco comments:    quit smoking in 1976  Vaping Use   Vaping status: Never Used  Substance Use Topics   Alcohol  use: Never    Alcohol /week: 2.0 standard drinks of alcohol     Types: 2 Glasses of wine per week   Drug use: No     Family Hx: The patient's family  history includes Breast cancer (age of onset: 63) in her maternal aunt; CAD (age of onset: 57) in her son; Diabetes in her mother; Heart attack in her maternal grandfather; Heart attack (age of onset: 80) in her son; Heart disease in her mother; Hypertension in her mother; Lung cancer in her father; Stroke in her mother. There is no history of Rectal cancer, Stomach cancer, Esophageal cancer, or Colon cancer.  ROS:   Please see the history  of present illness.     All other systems reviewed and are negative.   Prior CV studies:   The following studies were reviewed today:  Echocardiogram 03/26/2021   1. Left ventricular ejection fraction, by estimation, is 60 to 65%. The  left ventricle has normal function. The left ventricle has no regional  wall motion abnormalities. There is severe left ventricular hypertrophy.   2. Right ventricular systolic function is normal. The right ventricular  size is mildly enlarged.   3. Left atrial size was moderately dilated. No left atrial/left atrial  appendage thrombus was detected.   4. Right atrial size was moderately dilated.   5. The mitral valve has been repaired/replaced. Trivial mitral valve  regurgitation. Mild to moderate mitral stenosis. There is a 25 mm Edwards  MagnaEase bioprosthetic valve present in the mitral position. Procedure  Date: 12/08/13.   6. Tricuspid valve regurgitation is moderate.   7. The aortic valve has been repaired/replaced. Aortic valve  regurgitation is not visualized. There is a 21 mm Magna bioprosthetic  valve present in the aortic position. Procedure Date: 12/08/13.   8. Aortic dilatation noted. There is mild dilatation of the ascending  aorta, measuring 41 mm. There is Moderate (Grade III) plaque involving the  descending aorta.   9. Moderately dilated pulmonary artery.  10. Evidence of atrial level shunting detected by color flow Doppler.  There is a small patent foramen ovale.    Labs/Other Tests and Data Reviewed:   CT angiogram of head and neck 06/18/2021 No evidence of acute brain infarction. Chronic small-vessel ischemic change of the white matter. Old lacunar infarction left caudate head. No intracranial large vessel occlusion or correctable proximal stenosis. Aortic atherosclerosis. Atherosclerotic disease at both carotid artery bifurcations. 30-40% stenosis of the proximal ICA on the right. 30% stenosis of the proximal ICA on the  left.  CT chest high-resolution 05/29/2023 On my measurement the ascending aorta measures 44 x 42 mm in diameter. There is aortic atherosclerosis and three-vessel coronary artery calcification.  As on previous CTs, high suspicion that the right ventricular pacing lead is in the pericardial space (this would explain positive Underwent recent endoscopy and colonoscopy without any major pathology identified.R waves in lead V1)  EKG: Personally reviewed the tracing from 09/11/2023 which shows atrial sensed (sinus)-ventricular paced rhythm  EKG Interpretation Date/Time:    Ventricular Rate:    PR Interval:    QRS Duration:    QT Interval:    QTC Calculation:   R Axis:      Text Interpretation:          Recent Labs: 02/05/2024 proBNP 754 08/27/2023: B Natriuretic Peptide 293.9 08/30/2023: Magnesium  2.2; TSH 0.701 01/05/2024: Hemoglobin 13.4; Platelet Count 75 02/05/2024: ALT 12; BUN 17; Creatinine, Ser 0.84; Potassium 4.7; Pro B Natriuretic peptide (BNP) 754.0; Sodium 142   Recent Lipid Panel Lab Results  Component Value Date/Time   CHOL 152 06/21/2023 07:29 AM   TRIG 85.0 06/21/2023 07:29 AM   HDL 67.70 06/21/2023 07:29 AM   CHOLHDL  2 06/21/2023 07:29 AM   LDLCALC 67 06/21/2023 07:29 AM   LDLDIRECT 111.0 06/26/2015 09:28 AM    Wt Readings from Last 3 Encounters:  02/29/24 66.9 kg  02/05/24 69.4 kg  01/25/24 67.1 kg     Objective:    Vital Signs:  BP 139/79   Pulse 77   Ht 5\' 4"  (1.626 m)   Wt 66.9 kg   SpO2 (!) 88%   BMI 25.30 kg/m       General: Alert, oriented x3, no distress, healthy left subclavian pacemaker site Head: no evidence of trauma, PERRL, EOMI, no exophtalmos or lid lag, no myxedema, no xanthelasma; normal ears, nose and oropharynx Neck: normal jugular venous pulsations and no hepatojugular reflux; brisk carotid pulses without delay and no carotid bruits Chest: clear to auscultation, no signs of consolidation by percussion or palpation, normal  fremitus, symmetrical and full respiratory excursions Cardiovascular: normal position and quality of the apical impulse, regular rhythm, normal first and second heart sounds, 1-2/6 aortic ejection murmur, no diastolic murmurs, rubs or gallops Abdomen: no tenderness or distention, no masses by palpation, no abnormal pulsatility or arterial bruits, normal bowel sounds, no hepatosplenomegaly Extremities: no clubbing, cyanosis or edema; 2+ radial, ulnar and brachial pulses bilaterally; 2+ right femoral, posterior tibial and dorsalis pedis pulses; 2+ left femoral, posterior tibial and dorsalis pedis pulses; no subclavian or femoral bruits Neurological: grossly nonfocal Psych: Normal mood and affect    ASSESSMENT & PLAN:    1. Chronic diastolic heart failure (HCC)   2. Stenosis of prosthetic mitral valve, subsequent encounter   3. History of aortic valve replacement with bioprosthetic valve   4. CHB (complete heart block) (HCC)   5. SSS (sick sinus syndrome) (HCC)   6. Pacemaker   7. Paroxysmal atrial fibrillation (HCC)   8. Acquired thrombophilia (HCC)   9. History of syncope   10. Aneurysm of ascending aorta without rupture (HCC)   11. Chronic obstructive pulmonary disease, unspecified COPD type (HCC)   12. Hypercholesterolemia   13. ASCVD (arteriosclerotic cardiovascular disease)   14. Aortic atherosclerosis (HCC)       CHF: No clinical signs of hypervolemia.  proBNP was modestly elevated last month.  Denies shortness of breath but has a lot of fatigue.  Normal LVEF and normal function of her aortic and mitral bioprosthesis.   Previously had decompensation due to mitral bioprosthetic valve thrombosis and increased mitral stenosis.   Prosthetic MV stenosis: Transient, related to thrombotic leaflet restriction.  Most recent echocardiogram which was a TEE in November 2024 showed that the mitral valve gradients are back to baseline (after device implantation in 2015 the mean mitral valve  gradient was 6 mmHg, now 7 mmHg at a comparable heart rate of 60 bpm).  We switched  from warfarin back to Eliquis  due to problems with maintaining INR in therapeutic range and easy bleeding problems when INR was high.   S/p AVR: Normal function of the prosthesis, stable gradients.  Understands the need for endocarditis prophylaxis with dental procedure. 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  At other times she has had an idioventricular escape rhythm.  For safety purposes would treat her as if she is device dependent. SSS-tachy/brady sd: Current heart rate histogram suggest appropriate sensor settings for her rather sedentary lifestyle. Pacemaker: Normal device function.  1 year of estimated battery longevity.  Will continue remote checks every 3 months until later this year when  we will probably have to switch to monthly checks when the estimated battery longevity is 6 months or less. Atrial fibrillation: Moderate burden of arrhythmia.  She required a cardioversion in November but then she had another lengthy episode of persistent atrial fibrillation in April 2025 that spontaneously reverted to normal rhythm after about 10 days.  He is not on any antiarrhythmics. Anticoagulation: No bleeding problems.  Recent hemoglobin 13.4.  The recent EGD and colonoscopy without serious pathology.  Despite the presence of biological aortic and mitral valve prostheses, we have switched from warfarin to Eliquis  due to difficulty with maintaining INR levels in therapeutic range and bleeding complications.   History of syncope: Has not occurred recently.  History of syncope on higher doses of bisoprolol  due to orthostatic hypotension.  No recurrence on metoprolol . COPD: No recent episodes of exacerbation.  Denies wheezing.  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  Improving on most recent scan from November 2022.  Last saw Dr. Aubery Blare on 02/14/2022 Recurrent vision loss: No recent events.  Evaluated by neurology and retina specialist.    Since the episodes are repetitive, this makes cardioembolic events virtually impossible.   Ascending aortic aneurysm: On my measurement 42x44 mm (although not mentioned on the radiology report) on her CT chest without contrast from August 2024.  I do not think she would be a candidate for redo sternotomy to repair an aortic aneurysm so serial follow-up is really not indicated.    HLP: Does not have known CAD/PAD, but does have some aortic atherosclerosis.  Lipid profile from September 2024 showed HDL of 68 and LDL of 67.  Continue lovastatin . DISH: Limits her activity more than dyspnea does. Memory problems: This seems to be more an issue with attention and focus, rather than dementia.  Anxiety and depression probably playing a role.  Did well on MMSE (score 30).  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  Note is made of atherosclerotic calcification of the intracranial arteries at the base of the brain. ASCVD: Has aortic atherosclerosis. 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  No significant stenosis of the intracranial circulation.   Patient Instructions  Medication Instructions:  Your physician recommends that you continue on your current medications as directed. Please refer to the Current Medication list given to you today.  *If you need a refill on your cardiac medications before  your next appointment, please call your pharmacy*  Lab Work: NONE If you have labs (blood work) drawn today and your tests are completely normal, you will receive your results only by: MyChart Message (if you have MyChart) OR A paper copy in the mail If you have any lab test that is abnormal or we need to change your treatment, we will call you to review the results.  Testing/Procedures: NONE  Follow-Up: At [facility], you and your health needs are our priority.  As part of our continuing mission to provide you with exceptional heart care, our providers are all part of  one team.  This team includes your primary Cardiologist (physician) and Advanced Practice Providers or APPs (Physician Assistants and Nurse Practitioners) who all work together to provide you with the care you need, when you need it.  Your next appointment:   6 month(s)  Provider:   You may see DR. Ezra Denne or one of the following Advanced Practice Providers on your designated Care Team:   Mertha Abrahams, Kennard Pea [address]" [place], PA-C Suzann Riddle, NP Creighton Doffing, NP    We recommend signing up for the patient portal called "MyChart".  Sign up information is provided on this After Visit Summary.  MyChart is used to connect with patients for Virtual Visits (Telemedicine).  Patients are able to view lab/test results, encounter notes, upcoming appointments, etc.  Non-urgent messages can be sent to your provider as well.   To learn more about what you can do with MyChart, go to [email or web address].         Signed, Luana Rumple, MD  03/04/2024 8:57 AM    [facility]

## 2024-03-04 ENCOUNTER — Encounter: Payer: Self-pay | Admitting: Cardiovascular Disease

## 2024-03-06 NOTE — Progress Notes (Signed)
 Remote pacemaker transmission.

## 2024-03-06 NOTE — Addendum Note (Signed)
 Addended by: Lott Rouleau A on: 03/06/2024 09:02 AM   Modules accepted: Orders

## 2024-03-07 ENCOUNTER — Other Ambulatory Visit: Payer: Self-pay | Admitting: Family Medicine

## 2024-03-07 DIAGNOSIS — F4323 Adjustment disorder with mixed anxiety and depressed mood: Secondary | ICD-10-CM

## 2024-03-11 ENCOUNTER — Ambulatory Visit: Payer: Medicare Other | Admitting: Internal Medicine

## 2024-03-12 ENCOUNTER — Ambulatory Visit (INDEPENDENT_AMBULATORY_CARE_PROVIDER_SITE_OTHER): Payer: Medicare Other | Admitting: Internal Medicine

## 2024-03-12 VITALS — BP 126/76 | HR 79 | Ht 64.0 in | Wt 145.3 lb

## 2024-03-12 DIAGNOSIS — Z2239 Carrier of other specified bacterial diseases: Secondary | ICD-10-CM | POA: Diagnosis not present

## 2024-03-12 DIAGNOSIS — R918 Other nonspecific abnormal finding of lung field: Secondary | ICD-10-CM | POA: Diagnosis not present

## 2024-03-12 DIAGNOSIS — J449 Chronic obstructive pulmonary disease, unspecified: Secondary | ICD-10-CM | POA: Diagnosis not present

## 2024-03-12 DIAGNOSIS — R54 Age-related physical debility: Secondary | ICD-10-CM

## 2024-03-12 DIAGNOSIS — Z87891 Personal history of nicotine dependence: Secondary | ICD-10-CM

## 2024-03-12 NOTE — Patient Instructions (Addendum)
 Stage 3 severe COPD by GOLD classification (HCC) Multiple lung nodules on CT Pseudomonas aeruginosa colonization Former smoker   -Lung function has deteriorated in the last 5 years.  You now with severe COPD classification.  I suspect this because of the nodules and aging but at this point in time investigating the nodules adds risks. Glad 03/12/2024 and  12/06/2023 thins are more stable compared to Nov 2024.  -  Also, unclear if OTHUVAYRE is helping you with symtoms but atleast no flare up  Plan  - Hold off bronchoscopy plans for now - Continue Trelegy scheduled - Continue albuterol  as needed - Contnue OTHUVYARE nebulizer twice a day  -Monitor for side effects including neuropsychiatric side effects. - do CT Chest without contrast in 6 months   Follow-up - 6 months with Dr. Bertrum Brodie or APP

## 2024-03-12 NOTE — Progress Notes (Signed)
 OV 09/04/2023  Subjective:  Patient ID: [name], female , DOB: 1946-01-26 , age 78 y.o. , MRN: [id number] , ADDRESS: [address] [place] [address] PCP Claire Crick, MD Patient Care Team: Claire Crick, MD as PCP - General (Family Medicine) Luana Rumple, MD as PCP - Cardiology (Cardiology) Janel Medford, MD as Consulting Physician (Gastroenterology) Johna Myers, MD as Consulting Physician (Radiation Oncology) Synetta Eves, MD as Consulting Physician (Hematology and Oncology) Beatrice Lin, MD (Inactive) as Consulting Physician (Cardiology) [name], MD as Consulting Physician (Pulmonary Disease) Lockie Rima, MD (General Surgery) Luana Rumple, MD as Consulting Physician (Cardiology) Oris Birmingham, MD as Consulting Physician (Ophthalmology) [place], Amy, MD as Consulting Physician (Dermatology) Merriam Abbey, DO as Consulting Physician (Neurology)  This Provider for this visit: Treatment Team:  Attending Provider: [name], MD    09/04/2023 -   Chief Complaint  Patient presents with   Follow-up    Recent admission for heart failure. She has had increased DOE and has occ coughing spell- non prod.     HPI [name] 78 y.o. -is here for follow-up because of August 2024 CT chest showed progressive nodularity.  Radiology still concerned about MAI but 2022 bronchoscopy only showed Pseudomonas.  We deliberate about doing another bronc in case there was lung function worsening.  But at last visit the PFTs were not done.  So she has had PFTs this time.  The PFT does show decline in FEV1 [see below].  However according to history and review of the records she got admitted to the hospital between 1117/24 and also 08/30/2023 for acute heart failure with preserved ejection fraction.  Of note review of the records indicate that she underwent a CT-guided bone marrow biopsy 08/23/2023 chronic anemia.  She is  now on lisinopril  for her heart failure.  They did treat her empirically for COPD exacerbation with steroids.  She was having some pleural effusions as well.  This was treated with IV Lasix  I personally lysed the chest x-ray and confirmed the same findings. personally viewed.  At this point in time even though she is well.  She says she is physically deconditioned.  It appears home physical therapy is not set up for her.  She lives alone.  Her friends found her sick and brought her in.  Her granddaughter is going to come and visit with her but she lives in [place] .Of note .       OV 12/06/2023  Subjective:  Patient ID: [name], female , DOB: 12/29/1945 , age 78 y.o. , MRN: [id number] , ADDRESS: [address] [place] [address] PCP Claire Crick, MD Patient Care Team: Claire Crick, MD as PCP - General (Family Medicine) Luana Rumple, MD as PCP - Cardiology (Cardiology) Janel Medford, MD (Inactive) as Consulting Physician (Gastroenterology) Johna Myers, MD as Consulting Physician (Radiation Oncology) Synetta Eves, MD as Consulting Physician (Hematology and Oncology) Beatrice Lin, MD (Inactive) as Consulting Physician (Cardiology) [name], MD as Consulting Physician (Pulmonary Disease) Lockie Rima, MD (General Surgery) Luana Rumple, MD as Consulting Physician (Cardiology) Oris Birmingham, MD as Consulting Physician (Ophthalmology) [place], Amy, MD as Consulting Physician (Dermatology) Merriam Abbey, DO as Consulting Physician (Neurology)  This Provider for this visit: Treatment Team:  Attending Provider: [name], MD    12/06/2023 -   Chief Complaint  Patient presents with   Follow-up    Breathing is overall doing well. She has  been dealing with nausea off and on past several wks. She has rare coughing spell brought on by "tickle in my throat".       HPI Deniss Hiral Lukasiewicz 78 y.o. -returns  for follow-up.  She says she has no issues with the breathing she feels her breathing is stable.  Last visit we started her on OTHUVAYRE but she states she is only takes it as needed because she forgets.  Nevertheless she started this around a month ago.  She is willing sure if she is any better but then 3 weeks ago she started with significant nausea but it still kind of cyclical.  It was really bad then got better and now getting worse again.  She seen primary care recent lab work is normal.  Etiology not known.  We reviewed the side effects of the nebulizer and nausea is not one of them.  She does not think it is from the new nebulizer either.  At this point in time she is content with the status quo on her lungs she just wants to get her nausea workup done.  This being done by primary care.  It also appears in April she has a GI endoscopy or something pending.     OV 03/12/2024  Subjective:  Patient ID: [name], female , DOB: December 03, 1945 , age 78 y.o. , MRN: [id number] , ADDRESS: [address] [place] [address] PCP Claire Crick, MD Patient Care Team: Claire Crick, MD as PCP - General (Family Medicine) Luana Rumple, MD as PCP - Cardiology (Cardiology) Janel Medford, MD (Inactive) as Consulting Physician (Gastroenterology) Johna Myers, MD as Consulting Physician (Radiation Oncology) Synetta Eves, MD as Consulting Physician (Hematology and Oncology) Beatrice Lin, MD (Inactive) as Consulting Physician (Cardiology) [name], MD as Consulting Physician (Pulmonary Disease) Lockie Rima, MD (General Surgery) Luana Rumple, MD as Consulting Physician (Cardiology) Oris Birmingham, MD as Consulting Physician (Ophthalmology) [place], Amy, MD as Consulting Physician (Dermatology) Merriam Abbey, DO as Consulting Physician (Neurology)  This Provider for this visit: Treatment Team:  Attending Provider: [name], MD    03/12/2024 -    Chief Complaint  Patient presents with   Stage 3 severe COPD by GOLD classificatio     Advanced COPD on Spiriva  and Breo and Singulair    -Has associated right lower lobe bronchiectasis. 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  Strongly positive ANA in spring 2023  -Reassured rheumatology consultation with Dr. Athena Lazier 2023.   Other issues Macrocytic anemia Bone marrow biopsy 08/23/2023-variably cellular marrow, no dyspoiesis or increase in blast cells, decreased storage iron, normal cytogenetics, negative MDS FISH panel   Thrombocytopenia   3.  COPD 4.  Aortic valve replacement 2015 5.  Mitral valve replacement 2015 6.  Kidney stone 2024 7.  Rectal cancer,uT3N1 August 2012, status post neoadjuvant Xeloda /radiation Partial colectomy 08/15/2011 (T3 N0) well-differentiated mucinous adenocarcinoma the rectum Adjuvant Xeloda  01/30/2012 - 03/05/2012 CT Abdo/pelvis 09/29/2023: Stable soft tissue thickening at the anastomotic suture line and stable obturator lymph node, 8.  Arrhythmia, pacemaker in place 9.  Chronic venous stasis with lower extremity ulcers 10.  Admission with heart failure, atrial fibrillation/flutter, and COPD exacerbation 08/27/2023: Cardioversion performed for 19 2024 11.  Frequent UTIs  HPI [name] 78 y.o. -returns for follow-up.  Last visit was in February 2025.  Since the hospitalization in November 2020 for no further hospitalizations for respiratory issues.  Overall reports stable health.  In March 2025 she did see Dr. Coni Deep for microcytic anemia.  She was noticed to have chronic mild anemia and thrombocytopenia.  November 2020 for bone marrow biopsy was nondiagnostic.  He has around expectant follow-up.  She did see follow-up with primary care  physician in April 2025.  External record review shows that she saw cardiology in May 2025 all her issues were addressed.  She remains on Trelegy, oxygen and OTHUVAYRE .Aaron Aas  She is unclear if the latter is helping her but she has not had any exacerbations and I reminded her that this might be of benefit.  She is agreed to be a little bit more compliant with it and continue it.  Her last CT scan of the chest was in August 2024.  She is now 78 years old.  She has a previous history of smoking.  She also has previous history of rectal cancer.  Therefore we felt that might be a benefit in doing a scan in 6 months.  This will be CT scan of the chest.  She is agreeable.   Simple office walk 185 feet x  3 laps goal with forehead probe 12/02/2022    O2 used ra   Number laps completed 2 of 3   Comments about pace slow   Resting Pulse Ox/HR 97% and 87/min   Final Pulse Ox/HR 95% and 97/min   Desaturated </= 88% no   Desaturated <= 3% points no   Got Tachycardic >/= 90/min yes   Symptoms at end of test bACK pain and stopped   Miscellaneous comments x      PFT     Latest Ref Rng & Units 09/01/2023   10:48 AM 03/13/2018   10:36 AM 04/01/2016   10:10 AM 11/29/2013    4:04 PM  PFT Results  FVC-Pre L 1.45  2.21  2.22  1.49   FVC-Predicted Pre % 53  70  68  42   FVC-Post L   2.33  1.60   FVC-Predicted Post %   72  45   Pre FEV1/FVC % % 69  64  61  63   Post FEV1/FCV % %   63  65   FEV1-Pre L 1.00  1.42  1.35  0.93   FEV1-Predicted Pre % 49  59  55  34   FEV1-Post L   1.47  1.05   DLCO uncorrected ml/min/mmHg 13.37  24.55  19.21    DLCO UNC% % 70  91  71    DLCO corrected ml/min/mmHg 14.70  22.70     DLCO COR %Predicted % 77  84     DLVA Predicted % 132  102  95    TLC L   5.00    TLC % Predicted %   93    RV % Predicted %   121     CT lung image in abd dec 2024 IN[facility]: Lower chest: No acute abnormality. Coronary artery calcifications. Extensive calcifications of the mitral valve. Partially  visualized intracardiac leads. 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 (12/31/2016), Acute cystitis without  hematuria (12/22/2015), Acute right-sided low back pain without sciatica (11/26/2019), Adjustment disorder with depressed mood (10/15/2014), Anemia, Anticoagulant long-term use (06/14/2019), Aortic atherosclerosis (03/03/2022), Arthritis, Arthropathy of spinal facet joint (03/08/2018), Ascending aortic aneurysm (03/02/2021), Bilateral hip pain (09/08/2016), Cancer (HCC), CAP (community acquired pneumonia) (02/08/2016), Carotid stenosis (12/08/2020), Cataracts, bilateral, Chest pain (08/25/2014), Chronic cholecystitis with calculus (01/21/2015), Chronic diastolic heart failure (09/19/2014), Chronic insomnia, Chronic lower back pain (03/05/2008), CKD (chronic kidney disease) stage 2, GFR 60-89 ml/min (07/03/2015), Constipation, COPD (chronic obstructive pulmonary disease), COVID-19 virus infection (11/11/2022), DDD (degenerative disc disease), Depression, DISH (diffuse idiopathic skeletal hyperostosis) (05/14/2019), Dyspnea on exertion (06/05/2015), Edema of both lower legs due to peripheral venous insufficiency, Essential hypertension, benign (03/24/2009), Heart murmur, Hematuria (06/17/2021), History of kidney stones, History of radiation therapy (05/30/11 to 07/07/11), History of rectal cancer (05/26/2011), History of shingles, History of vertebral fracture (11/26/2019), Hyperlipemia, Lacunar infarction, Left hip pain (07/29/2020), Legally blind in left eye, 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), Macrocytosis (03/27/2022), Malaise and fatigue (01/22/2018), Meralgia paresthetica (12/13/2007), Mixed stress and urge urinary incontinence  (03/16/2015), Obesity, Osteopenia (12/2014), Other spondylosis with radiculopathy, lumbar region (07/17/2018), Pain of right sacroiliac joint (08/09/2017), Pancreatitis (11/2014), Peripheral neuropathy (01/03/2022), Persistent atrial fibrillation (12/09/2020), Personal history of colonic polyps, PONV (postoperative nausea and vomiting), Positive occult stool blood test, Presence of permanent cardiac pacemaker, 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), 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), Pulmonary artery hypertension (06/17/2021), Pulmonary infiltrates (04/23/2019), Pulmonary nodule (02/23/2012), Rheumatic heart disease mitral stenosis, Right leg swelling (06/11/2020), S/P aortic and mitral valve bioprostheses (12/08/2013), S/P AVR (aortic valve replacement) (2015), S/P MVR (mitral valve replacement) (2015), Sepsis secondary to UTI (HCC) (08/22/2017), Severe aortic valve stenosis (10/01/2013), Skin lesion (01/03/2022), Small bowel obstruction, partial (08/2013), 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   reports that she quit smoking about 49 years ago. Her smoking use included cigarettes. She started smoking about 59 years ago. She has a 15 pack-year smoking history. She has never used smokeless tobacco.  Past Surgical History:  Procedure Laterality Date   ANTERIOR LAT LUMBAR FUSION Left 07/17/2018   Procedure: Lumbar Two-Three Lumbar Three-Four Anterolateral decompression/interbody fusion with lateral plate fixation/Infuse;  Surgeon: Elna Haggis, MD;  Location: [facility];  Service: Neurosurgery;  Laterality: Left;  Lumbar Two-Three Lumbar Three-Four Anterolateral decompression/interbody fusion with lateral plate fixation/Infuse   AORTIC VALVE REPLACEMENT N/A 12/12/2013   Procedure: AORTIC  VALVE REPLACEMENT (AVR);  Surgeon: Bartley Lightning, MD; Service: Open Heart Surgery   BACK SURGERY  2006, 2007   2006 SPACER, 2007 decompression and fusion L4/5   BOWEL RESECTION  04/02/2012   Procedure: SMALL BOWEL RESECTION;  Surgeon: Lockie Rima, MD;  Location: [facility];  Service: General;  Laterality: N/A;   BRONCHIAL WASHINGS  03/24/2021   Procedure: BRONCHIAL WASHINGS;  Surgeon: [name], MD;  Location: [facility];  Service: Endoscopy;;   CARDIOVERSION N/A 08/29/2023   Procedure: CARDIOVERSION (CATH LAB);  Surgeon: Lenise Quince, MD;  Location: [facility] INVASIVE CV LAB;  Service: Cardiovascular;  Laterality: N/A;   CARPAL TUNNEL RELEASE Bilateral    CATARACT EXTRACTION W/ INTRAOCULAR LENS IMPLANT Left 10/18/2016   CATARACT EXTRACTION W/ INTRAOCULAR LENS IMPLANT Right 12/13/2016   Dr. Matthew Songster   CHOLECYSTECTOMY N/A 01/21/2015   chronic cholecystitis, Lockie Rima, MD   COLON RESECTION  08/25/2011   Procedure: COLON RESECTION LAPAROSCOPIC;  Surgeon: Lockie Rima, MD;  Location: [facility];  Service: General;  Laterality: N/A;  Laparoscopic Assisted Low Anterior Resection Diverting Ostomy and onQ pain pump   COLONOSCOPY  03/2013   1 polyp, rpt 3 yrs Howard Macho)   COLONOSCOPY  05/2021   diverticulosis and patent colon anastomosis with some edema/narrowing, rpt 5 yrs Howard Macho)   COLONOSCOPY WITH PROPOFOL  N/A 06/09/2016   patent colo-colonic anastomosis, rpt 5 yrs Howard Macho)   COLONOSCOPY WITH PROPOFOL  N/A 01/25/2024   TA, fair prep (consider repeat if needed), patent rectal anastomosis with congestion edema and stenosis, mild sigmoid diverticulosis Lajuan Pila, MD)   CYSTOSCOPY/URETEROSCOPY/HOLMIUM LASER/STENT PLACEMENT Right 07/03/2023   Procedure: CYSTOSCOPY RIGHT RETROGRADE PYEPLGRAM RIGHT URETEROSCOPY/HOLMIUM LASER/STENT PLACEMENT;  Surgeon: Samson Croak, MD;  Location: [facility];  Service: Urology;  Laterality: Right;  1 HR FOR CASE   CYSTOSCOPY/URETEROSCOPY/HOLMIUM LASER/STENT  PLACEMENT Right 07/17/2023   Procedure: CYSTOSCOPY/RIGHT URETEROSCOPY/STENT EXCHANGE;  Surgeon: Samson Croak, MD;  Location: [facility];  Service: Urology;  Laterality: Right;  60 MINS FOR CASE   EP IMPLANTABLE DEVICE N/A 06/16/2016   Procedure: Pacemaker Implant;  Surgeon: Will Cortland Ding, MD;  Location: [facility] INVASIVE CV LAB;  Service: Cardiovascular;  Laterality: N/A;   ESOPHAGOGASTRODUODENOSCOPY (EGD) WITH PROPOFOL  N/A 01/25/2024   diffuse gastritis benign biopsy Venice Gillis, Havery Lions, MD)   FOOT SURGERY  left foot   hammer toe   ILEOSTOMY  08/25/2011   ILEOSTOMY CLOSURE  04/02/2012   Procedure: ILEOSTOMY TAKEDOWN;  Surgeon: Lockie Rima, MD;  Location: [facility];  Service: General;  Laterality: N/A;   INTRAOPERATIVE TRANSESOPHAGEAL ECHOCARDIOGRAM N/A 12/12/2013   Procedure: INTRAOPERATIVE TRANSESOPHAGEAL ECHOCARDIOGRAM;  Surgeon: Bartley Lightning, MD;  Location: [facility];  Service: Open Heart Surgery;  Laterality: N/A;   LEFT AND RIGHT HEART CATHETERIZATION WITH CORONARY ANGIOGRAM N/A 11/27/2013   Procedure: LEFT AND RIGHT HEART CATHETERIZATION WITH CORONARY ANGIOGRAM;  Surgeon: Luana Rumple, MD;  Location: [facility] CATH LAB;  Service: Cardiovascular;  Laterality: N/A;   MITRAL VALVE REPLACEMENT N/A 12/12/2013   Procedure: MITRAL VALVE (MV) REPLACEMENT OR REPAIR;  Surgeon: Bartley Lightning, MD; Service: Open Heart Surgery   POLYPECTOMY  01/25/2024   Procedure: POLYPECTOMY, INTESTINE;  Surgeon: Lajuan Pila, MD;  Location: [facility];  Service: Gastroenterology;;   TEE WITHOUT CARDIOVERSION N/A 11/29/2013   Procedure: TRANSESOPHAGEAL ECHOCARDIOGRAM (TEE);  Surgeon: Jacqueline Matsu, MD;  Location: [facility] ENDOSCOPY;  Service: Cardiovascular;  Laterality: N/A;   TEE WITHOUT CARDIOVERSION N/A 06/04/2019   Procedure: TRANSESOPHAGEAL ECHOCARDIOGRAM (TEE);  Surgeon: Luana Rumple, MD;  Location: [facility] ENDOSCOPY;  Service: Cardiovascular;  Laterality: N/A;   TONSILLECTOMY     TRANSESOPHAGEAL ECHOCARDIOGRAM (CATH LAB)  N/A 08/29/2023   Procedure: TRANSESOPHAGEAL ECHOCARDIOGRAM;  Surgeon: Lenise Quince, MD;  Location: [facility] INVASIVE CV LAB;  Service: Cardiovascular;  Laterality: N/A;   VIDEO BRONCHOSCOPY N/A 03/24/2021   Procedure: VIDEO BRONCHOSCOPY WITHOUT FLUORO;  Surgeon: [name], MD;  Location: [facility];  Service: Endoscopy;  Laterality: N/A;  SMALL SCOPE,PREMEDICATE WITH NEBULIZED LIDOCAINE     Allergies  Allergen Reactions   Tape Other (See Comments)    "THICK TAPES PULLS OFF MY SKIN"   Codeine Nausea Only   Prednisone  Nausea Only   Sulfa Antibiotics Nausea Only    Immunization History  Administered Date(s) Administered   Fluad Quad(high Dose 65+) 07/11/2019, 07/14/2020, 07/25/2022   Fluad Trivalent(High Dose 65+) 08/30/2023   H1N1 09/17/2008   Influenza Split 07/18/2012   Influenza Whole 08/10/2005, 06/24/2009, 07/11/2011   Influenza, High Dose Seasonal PF 07/04/2016, 07/04/2017, 07/27/2021   Influenza,inj,Quad PF,6+ Mos 07/09/2018  Influenza-Unspecified 07/24/2013, 06/30/2014, 06/30/2015   Moderna Covid-19 Vaccine Bivalent Booster 72yrs & up 07/02/2021, 07/24/2023   Moderna SARS-COV2 Booster Vaccination 08/14/2020   Moderna Sars-Covid-2 Vaccination 02/26/2021   PFIZER(Purple Top)SARS-COV-2 Vaccination 10/30/2019, 11/20/2019   Pfizer(Comirnaty)Fall Seasonal Vaccine 12 years and older 08/15/2022   Pneumococcal Conjugate-13 06/02/2014   Pneumococcal Polysaccharide-23 08/10/2005, 04/21/2008, 06/25/2013   Respiratory Syncytial Virus Vaccine,Recomb Aduvanted(Arexvy) 07/13/2022   Rsv, Bivalent, Protein Subunit Rsvpref,pf Pattricia Bores) 07/13/2022   Td 10/11/2005   Zoster Recombinant(Shingrix) 06/25/2018, 08/28/2018   Zoster, Live 09/16/2014    Family History  Problem Relation Age of Onset   Heart disease Mother    Diabetes Mother    Hypertension Mother    Stroke Mother    Lung cancer Father    Breast cancer Maternal Aunt 33   Heart attack Maternal Grandfather    CAD Son 7    Heart attack Son 34   Rectal cancer Neg Hx    Stomach cancer Neg Hx    Esophageal cancer Neg Hx    Colon cancer Neg Hx      Current Outpatient Medications:    acetaminophen  (TYLENOL ) 500 MG tablet, Take 2 tablets (1,000 mg total) by mouth in the morning and at bedtime., Disp: , Rfl:    acidophilus (RISAQUAD) CAPS capsule, Take 1 capsule by mouth daily., Disp: , Rfl:    albuterol  (VENTOLIN  HFA) 108 (90 Base) MCG/ACT inhaler, Inhale 2 puffs into the lungs every 6 (six) hours as needed for wheezing., Disp: 18 g, Rfl: 6   ascorbic acid  (VITAMIN C) 500 MG tablet, Take 1 tablet (500 mg total) by mouth daily., Disp: , Rfl:    Biotin 1000 MCG tablet, Take 1,000 mcg by mouth daily., Disp: , Rfl:    buPROPion  (WELLBUTRIN  SR) 150 MG 12 hr tablet, TAKE 1 TABLET(150 MG) BY MOUTH EVERY MORNING, Disp: 90 tablet, Rfl: 1   calcium  carbonate (OSCAL) 1500 (600 Ca) MG TABS tablet, Take 600 mg of elemental calcium  by mouth daily., Disp: , Rfl:    carboxymethylcellulose (REFRESH PLUS) 0.5 % SOLN, Place 1 drop into both eyes 3 (three) times daily as needed (dry eyes)., Disp: , Rfl:    cephALEXin  (KEFLEX ) 250 MG capsule, Take 250 mg by mouth daily with breakfast., Disp: , Rfl:    cholecalciferol (VITAMIN D3) 25 MCG (1000 UNIT) tablet, Take 1,000 Units by mouth daily., Disp: , Rfl:    docusate sodium  (COLACE) 100 MG capsule, Take 200 mg by mouth daily., Disp: , Rfl:    ELIQUIS  5 MG TABS tablet, TAKE 1 TABLET(5 MG) BY MOUTH TWICE DAILY (Patient taking differently: Take 5 mg by mouth in the morning and at bedtime.), Disp: 180 tablet, Rfl: 1   Ensifentrine  (OHTUVAYRE ) 3 MG/2.5ML SUSP, Inhale into the lungs. Twice daily via neb, Disp: , Rfl:    ferrous sulfate  325 (65 FE) MG EC tablet, Take 1 tablet (325 mg total) by mouth daily with breakfast., Disp: , Rfl:    Fluticasone -Umeclidin-Vilant (TRELEGY ELLIPTA ) 200-62.5-25 MCG/ACT AEPB, Inhale 1 puff into the lungs daily., Disp: 60 each, Rfl: 11   furosemide  (LASIX ) 40 MG  tablet, TAKE 1 TABLET BY MOUTH EVERY DAY. MAY TAKE AN EXTRA TABLET AS NEEDED FOR SWELLING (Patient taking differently: Take 40 mg by mouth in the morning.), Disp: 135 tablet, Rfl: 2   gabapentin  (NEURONTIN ) 100 MG capsule, Take 1 capsule (100 mg total) by mouth 2 (two) times daily as needed (back pain - for daytime use). (Patient taking differently: Take 100 mg by  mouth daily.), Disp: 60 capsule, Rfl: 1   gabapentin  (NEURONTIN ) 300 MG capsule, Take 300 mg by mouth at bedtime., Disp: , Rfl:    hydrocortisone  (ANUSOL -HC) 2.5 % rectal cream, APPLY RECTALLY TO THE AFFECTED AREA TWICE DAILY, Disp: 30 g, Rfl: 0   ipratropium-albuterol  (DUONEB) 0.5-2.5 (3) MG/3ML SOLN, Take 3 mLs by nebulization every 6 (six) hours as needed., Disp: 360 mL, Rfl: 0   lovastatin  (MEVACOR ) 40 MG tablet, TAKE 1 TABLET(40 MG) BY MOUTH AT BEDTIME, Disp: 90 tablet, Rfl: 2   metoprolol  succinate (TOPROL  XL) 25 MG 24 hr tablet, Take 1 tablet (25 mg total) by mouth every evening., Disp: 90 tablet, Rfl: 3   montelukast  (SINGULAIR ) 10 MG tablet, TAKE 1 TABLET(10 MG) BY MOUTH AT BEDTIME, Disp: 90 tablet, Rfl: 1   Multiple Vitamins-Minerals (PRESERVISION AREDS 2 PO), Take 2 capsules by mouth in the morning., Disp: , Rfl:    omeprazole  (PRILOSEC) 20 MG capsule, Take 1 capsule (20 mg total) by mouth daily., Disp: 90 capsule, Rfl: 3   ondansetron  (ZOFRAN -ODT) 4 MG disintegrating tablet, Take 1 tablet (4 mg total) by mouth every 8 (eight) hours as needed for nausea or vomiting., Disp: 20 tablet, Rfl: 1   Phenazopyrid-Cranbry-C-Probiot (AZO URINARY TRACT SUPPORT PO), Take 1 capsule by mouth daily., Disp: , Rfl:    polyethylene glycol (MIRALAX  / GLYCOLAX ) packet, Take 17 g by mouth daily. (Patient taking differently: Take 17 g by mouth daily as needed for mild constipation.), Disp: 14 each, Rfl: 0   potassium chloride  (KLOR-CON ) 10 MEQ tablet, TAKE 2 TABLETS(20 MEQ) BY MOUTH DAILY, Disp: 180 tablet, Rfl: 1   promethazine  (PHENERGAN ) 12.5 MG  tablet, Take 1 tablet (12.5 mg total) by mouth every 8 (eight) hours as needed for nausea or vomiting., Disp: , Rfl:    psyllium (REGULOID) 0.52 g capsule, Take 0.52 g by mouth in the morning and at bedtime., Disp: , Rfl:    SUFLAVE  178.7 g SOLR, Take by mouth as directed., Disp: , Rfl:    traMADol  (ULTRAM ) 50 MG tablet, Take 1-2 tablets (50-100 mg total) by mouth 2 (two) times daily as needed for moderate pain (pain score 4-6)., Disp: 90 tablet, Rfl: 0   traZODone  (DESYREL ) 50 MG tablet, TAKE 1/2 TO 1 TABLET(25 TO 50 MG) BY MOUTH AT BEDTIME, Disp: 30 tablet, Rfl: 5   Vibegron  (GEMTESA ) 75 MG TABS, Take 75 mg by mouth daily., Disp: , Rfl:    vitamin B-12 (CYANOCOBALAMIN ) 500 MCG tablet, Take 1 tablet (500 mcg total) by mouth 2 (two) times a week., Disp: , Rfl:    Zinc  50 MG CAPS, Take 1 capsule (50 mg total) by mouth daily., Disp: , Rfl: 0   lisinopril  (ZESTRIL ) 5 MG tablet, Take 1 tablet (5 mg total) by mouth daily., Disp: 90 tablet, Rfl: 1      Objective:   Vitals:   03/12/24 1133  BP: 126/76  Pulse: 79  SpO2: (!) 81%  Weight: 145 lb 5 oz (65.9 kg)  Height: 5\' 4"  (1.626 m)    Estimated body mass index is 24.94 kg/m as calculated from the following:   Height as of this encounter: 5\' 4"  (1.626 m).   Weight as of this encounter: 145 lb 5 oz (65.9 kg).  @WEIGHTCHANGE @  [employer]   03/12/24 1133  Weight: 145 lb 5 oz (65.9 kg)     Physical Exam   General: No distress. Frail 90% RA arst O2 at rest: yes as neded Cane present:  yes Sitting in wheel chair: no Frail: yes Obese: no Neuro: Alert and Oriented x 3. GCS 15. Speech normal Psych: Pleasant Resp:  Barrel Chest - no.  Wheeze - no, Crackles - yes base, No overt respiratory distress CVS: Normal heart sounds. Murmurs - no Ext: Stigmata of Connective Tissue Disease - no HEENT: Normal upper airway. PEERL +. No post nasal drip        Assessment:       ICD-10-CM   1. Stage 3 severe COPD by GOLD classification (HCC)   J44.9 CT Chest Wo Contrast    2. Multiple lung nodules on CT  R91.8 CT Chest Wo Contrast    3. Pseudomonas aeruginosa colonization  Z22.39 CT Chest Wo Contrast    4. Frailty  R54 CT Chest Wo Contrast         Plan:     Patient Instructions  Stage 3 severe COPD by GOLD classification (HCC) Multiple lung nodules on CT Pseudomonas aeruginosa colonization Former smoker   -Lung function has deteriorated in the last 5 years.  You now with severe COPD classification.  I suspect this because of the nodules and aging but at this point in time investigating the nodules adds risks. Glad 03/12/2024 and  12/06/2023 thins are more stable compared to Nov 2024.  -  Also, unclear if OTHUVAYRE is helping you with symtoms but atleast no flare up  Plan  - Hold off bronchoscopy plans for now - Continue Trelegy scheduled - Continue albuterol  as needed - Contnue OTHUVYARE nebulizer twice a day  -Monitor for side effects including neuropsychiatric side effects. - do CT Chest without contrast in 6 months   Follow-up - 6 months with Dr. Bertrum Brodie or APP   FOLLOWUP No follow-ups on file.    SIGNATURE    Dr. [name], [name], F.C.C.P,  Pulmonary and Critical Care Medicine Staff Physician, [facility] Director - Interstitial Lung Disease  Program  Pulmonary Fibrosis [facility] at [facility] [place], [place], [address]  Pager: [phone], If no answer or between  15:00h - 7:00h: call [phone] Telephone: [phone]  12:08 PM 03/12/2024

## 2024-03-14 ENCOUNTER — Encounter: Payer: Self-pay | Admitting: Family Medicine

## 2024-03-14 ENCOUNTER — Ambulatory Visit (INDEPENDENT_AMBULATORY_CARE_PROVIDER_SITE_OTHER): Admitting: Family Medicine

## 2024-03-14 VITALS — BP 150/78 | HR 74 | Temp 98.1°F | Ht 64.0 in | Wt 143.2 lb

## 2024-03-14 DIAGNOSIS — W19XXXS Unspecified fall, sequela: Secondary | ICD-10-CM

## 2024-03-14 DIAGNOSIS — R600 Localized edema: Secondary | ICD-10-CM | POA: Diagnosis not present

## 2024-03-14 DIAGNOSIS — R110 Nausea: Secondary | ICD-10-CM

## 2024-03-14 DIAGNOSIS — F4323 Adjustment disorder with mixed anxiety and depressed mood: Secondary | ICD-10-CM

## 2024-03-14 DIAGNOSIS — I10 Essential (primary) hypertension: Secondary | ICD-10-CM | POA: Diagnosis not present

## 2024-03-14 DIAGNOSIS — R2689 Other abnormalities of gait and mobility: Secondary | ICD-10-CM

## 2024-03-14 DIAGNOSIS — R5383 Other fatigue: Secondary | ICD-10-CM

## 2024-03-14 DIAGNOSIS — I4819 Other persistent atrial fibrillation: Secondary | ICD-10-CM

## 2024-03-14 DIAGNOSIS — J449 Chronic obstructive pulmonary disease, unspecified: Secondary | ICD-10-CM

## 2024-03-14 DIAGNOSIS — I5032 Chronic diastolic (congestive) heart failure: Secondary | ICD-10-CM | POA: Diagnosis not present

## 2024-03-14 DIAGNOSIS — T148XXA Other injury of unspecified body region, initial encounter: Secondary | ICD-10-CM | POA: Insufficient documentation

## 2024-03-14 DIAGNOSIS — R918 Other nonspecific abnormal finding of lung field: Secondary | ICD-10-CM

## 2024-03-14 DIAGNOSIS — Z952 Presence of prosthetic heart valve: Secondary | ICD-10-CM

## 2024-03-14 MED ORDER — ONDANSETRON HCL 4 MG PO TABS: 4.0000 mg | ORAL_TABLET | Freq: Three times a day (TID) | ORAL | 0 refills | Status: DC | PRN

## 2024-03-14 NOTE — Assessment & Plan Note (Signed)
 Chronic, ongoing

## 2024-03-14 NOTE — Assessment & Plan Note (Signed)
 On eliquis

## 2024-03-14 NOTE — Assessment & Plan Note (Signed)
 Continue lasix  40mg  daily with 2nd dose PRN pedal edema/dyspnea.

## 2024-03-14 NOTE — Assessment & Plan Note (Signed)
 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

## 2024-03-14 NOTE — Patient Instructions (Addendum)
 Prescription for lightweight rollator walker provided today.  Try regular zofran  tablets.  Continue other medicines. Return after 06/27/2024 for physical/wellness visit

## 2024-03-14 NOTE — Assessment & Plan Note (Signed)
Continues eliquis.  

## 2024-03-14 NOTE — Assessment & Plan Note (Signed)
 Chronic, overall stable period on wellbutrin  SR 150mg  daily

## 2024-03-14 NOTE — Progress Notes (Signed)
 Ph: [phone] Fax: [phone]   Patient ID: [name], female    DOB: 1946/06/29, 78 y.o.   MRN: [id number]  This visit was conducted in person.  BP (!) 150/78   Pulse 74   Temp 98.1 F (36.7 C) (Oral)   Ht 5\' 4"  (1.626 m)   Wt 143 lb 4 oz (65 kg)   SpO2 90%   BMI 24.59 kg/m    CC: 6 wk f/u visit  Subjective:   HPI: [name] is a 78 y.o. female presenting on 03/14/2024 for Medical Management of Chronic Issues (Here for 6 wk f/u.)   Staying tired. Looking forward to getting yard ready planning to put flowers in pots - will have help.   See prior note for details.  Last visit we increased lasix  temporarily for weight gain, dyspnea thought acute CHF exacerbation - with benefit. Lasix  40mg  daily with BID PRN. 10 lbs down since last visit.  Currently taking lasix  40mg  daily, with BID dosing as needed.   Notes legs are not swollen anymore but R leg has area that continues to weep clear fluid.   Chronic nausea - oral iron was stopped - no better. She restarted oral iron. Zofran  ODT caused nausea- requests tablet.   Saw Dr Scherrie Curt heme/onc for macrocytic anemia, thrombocytopenia, s/p reassuring bone marrow biopsy 08/2023. ?early myelodysplasia. Monitoring Q4 months with upcoming appt 05/06/2024.   Chronic low back pain has seen neurosurgery Dr Ellery Guthrie - managing pain with tramadol  50-100mg  BID PRN. Finds 100mg  dose is more helpful.   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 Continues Trelegy with rescue albuterol , continues Othuvayre nebulizer twice daily - monitoring for possible mood changes. Planned f/u 6 months.  Pseudomonas colonization.   Requests Rx for lightweight rollator walker.      Relevant past medical, surgical, family and social history reviewed and updated as indicated. Interim medical history since our last visit reviewed. Allergies and medications reviewed and  updated. Outpatient Medications Prior to Visit  Medication Sig Dispense Refill   acetaminophen  (TYLENOL ) 500 MG tablet Take 2 tablets (1,000 mg total) by mouth in the morning and at bedtime.     acidophilus (RISAQUAD) CAPS capsule Take 1 capsule by mouth daily.     albuterol  (VENTOLIN  HFA) 108 (90 Base) MCG/ACT inhaler Inhale 2 puffs into the lungs every 6 (six) hours as needed for wheezing. 18 g 6   ascorbic acid  (VITAMIN C) 500 MG tablet Take 1 tablet (500 mg total) by mouth daily.     Biotin 1000 MCG tablet Take 1,000 mcg by mouth daily.     buPROPion  (WELLBUTRIN  SR) 150 MG 12 hr tablet TAKE 1 TABLET(150 MG) BY MOUTH EVERY MORNING 90 tablet 1   calcium  carbonate (OSCAL) 1500 (600 Ca) MG TABS tablet Take 600 mg of elemental calcium  by mouth daily.     carboxymethylcellulose (REFRESH PLUS) 0.5 % SOLN Place 1 drop into both eyes 3 (three) times daily as needed (dry eyes).     cephALEXin  (KEFLEX ) 250 MG capsule Take 250 mg by mouth daily with breakfast.     cholecalciferol (VITAMIN D3) 25 MCG (1000 UNIT) tablet Take 1,000 Units by mouth daily.     docusate sodium  (COLACE) 100 MG capsule Take 200 mg by mouth daily.     ELIQUIS  5 MG TABS tablet TAKE 1 TABLET(5 MG) BY MOUTH TWICE DAILY (Patient taking differently: Take 5 mg by mouth in  the morning and at bedtime.) 180 tablet 1   Ensifentrine  (OHTUVAYRE ) 3 MG/2.5ML SUSP Inhale into the lungs. Twice daily via neb     ferrous sulfate  325 (65 FE) MG EC tablet Take 1 tablet (325 mg total) by mouth daily with breakfast.     Fluticasone -Umeclidin-Vilant (TRELEGY ELLIPTA ) 200-62.5-25 MCG/ACT AEPB Inhale 1 puff into the lungs daily. 60 each 11   furosemide  (LASIX ) 40 MG tablet TAKE 1 TABLET BY MOUTH EVERY DAY. MAY TAKE AN EXTRA TABLET AS NEEDED FOR SWELLING (Patient taking differently: Take 40 mg by mouth in the morning.) 135 tablet 2   gabapentin  (NEURONTIN ) 100 MG capsule Take 1 capsule (100 mg total) by mouth 2 (two) times daily as needed (back pain - for  daytime use). (Patient taking differently: Take 100 mg by mouth daily.) 60 capsule 1   gabapentin  (NEURONTIN ) 300 MG capsule Take 300 mg by mouth at bedtime.     hydrocortisone  (ANUSOL -HC) 2.5 % rectal cream APPLY RECTALLY TO THE AFFECTED AREA TWICE DAILY 30 g 0   lovastatin  (MEVACOR ) 40 MG tablet TAKE 1 TABLET(40 MG) BY MOUTH AT BEDTIME 90 tablet 2   metoprolol  succinate (TOPROL  XL) 25 MG 24 hr tablet Take 1 tablet (25 mg total) by mouth every evening. 90 tablet 3   montelukast  (SINGULAIR ) 10 MG tablet TAKE 1 TABLET(10 MG) BY MOUTH AT BEDTIME 90 tablet 1   Multiple Vitamins-Minerals (PRESERVISION AREDS 2 PO) Take 2 capsules by mouth in the morning.     omeprazole  (PRILOSEC) 20 MG capsule Take 1 capsule (20 mg total) by mouth daily. 90 capsule 3   Phenazopyrid-Cranbry-C-Probiot (AZO URINARY TRACT SUPPORT PO) Take 1 capsule by mouth daily.     polyethylene glycol (MIRALAX  / GLYCOLAX ) packet Take 17 g by mouth daily. (Patient taking differently: Take 17 g by mouth daily as needed for mild constipation.) 14 each 0   potassium chloride  (KLOR-CON ) 10 MEQ tablet TAKE 2 TABLETS(20 MEQ) BY MOUTH DAILY 180 tablet 1   promethazine  (PHENERGAN ) 12.5 MG tablet Take 1 tablet (12.5 mg total) by mouth every 8 (eight) hours as needed for nausea or vomiting.     psyllium (REGULOID) 0.52 g capsule Take 0.52 g by mouth in the morning and at bedtime.     SUFLAVE  178.7 g SOLR Take by mouth as directed.     traMADol  (ULTRAM ) 50 MG tablet Take 1-2 tablets (50-100 mg total) by mouth 2 (two) times daily as needed for moderate pain (pain score 4-6). 90 tablet 0   traZODone  (DESYREL ) 50 MG tablet TAKE 1/2 TO 1 TABLET(25 TO 50 MG) BY MOUTH AT BEDTIME 30 tablet 5   Vibegron  (GEMTESA ) 75 MG TABS Take 75 mg by mouth daily.     vitamin B-12 (CYANOCOBALAMIN ) 500 MCG tablet Take 1 tablet (500 mcg total) by mouth 2 (two) times a week.     Zinc  50 MG CAPS Take 1 capsule (50 mg total) by mouth daily.  0   ipratropium-albuterol   (DUONEB) 0.5-2.5 (3) MG/3ML SOLN Take 3 mLs by nebulization every 6 (six) hours as needed. 360 mL 0   ondansetron  (ZOFRAN -ODT) 4 MG disintegrating tablet Take 1 tablet (4 mg total) by mouth every 8 (eight) hours as needed for nausea or vomiting. 20 tablet 1   lisinopril  (ZESTRIL ) 5 MG tablet Take 1 tablet (5 mg total) by mouth daily. 90 tablet 1   No facility-administered medications prior to visit.     Per HPI unless specifically indicated in ROS section below Review  of Systems  Objective:  BP (!) 150/78   Pulse 74   Temp 98.1 F (36.7 C) (Oral)   Ht 5\' 4"  (1.626 m)   Wt 143 lb 4 oz (65 kg)   SpO2 90%   BMI 24.59 kg/m   Wt Readings from Last 3 Encounters:  03/14/24 143 lb 4 oz (65 kg)  03/12/24 145 lb 5 oz (65.9 kg)  02/29/24 147 lb 6.4 oz (66.9 kg)      Physical Exam Vitals and nursing note reviewed.  Constitutional:      Comments: Ambulates with cane  HENT:     Mouth/Throat:     Mouth: Mucous membranes are moist.     Pharynx: Oropharynx is clear. No oropharyngeal exudate or posterior oropharyngeal erythema.  Eyes:     Extraocular Movements: Extraocular movements intact.     Pupils: Pupils are equal, round, and reactive to light.  Cardiovascular:     Rate and Rhythm: Normal rate and regular rhythm.     Pulses: Normal pulses.     Heart sounds: Normal heart sounds. No murmur heard. Pulmonary:     Effort: Pulmonary effort is normal. No respiratory distress.     Breath sounds: No wheezing, rhonchi or rales.     Comments: Bilateral coarse crackles  Abdominal:     General: Bowel sounds are normal. There is no distension.     Palpations: Abdomen is soft.     Tenderness: There is no abdominal tenderness.  Skin:    Findings: Bruising (BLE) and lesion present. No rash.     Comments: Blood blister to medial left lower leg  2.2cm x1.4cm   Neurological:     Mental Status: She is alert.  Psychiatric:        Mood and Affect: Mood normal.        Behavior: Behavior normal.        Results for orders placed or performed in visit on 02/05/24  Comprehensive metabolic panel with GFR   Collection Time: 02/05/24  3:19 PM  Result Value Ref Range   Sodium 142 135 - 145 mEq/L   Potassium 4.7 3.5 - 5.1 mEq/L   Chloride 102 96 - 112 mEq/L   CO2 35 (H) 19 - 32 mEq/L   Glucose, Bld 82 70 - 99 mg/dL   BUN 17 6 - 23 mg/dL   Creatinine, Ser 1.91 0.40 - 1.20 mg/dL   Total Bilirubin 0.6 0.2 - 1.2 mg/dL   Alkaline Phosphatase 66 39 - 117 U/L   AST 18 0 - 37 U/L   ALT 12 0 - 35 U/L   Total Protein 6.7 6.0 - 8.3 g/dL   Albumin  3.5 3.5 - 5.2 g/dL   GFR 47.82 >95.62 mL/min   Calcium  9.5 8.4 - 10.5 mg/dL  Brain natriuretic peptide   Collection Time: 02/05/24  3:19 PM  Result Value Ref Range   Pro B Natriuretic peptide (BNP) 754.0 (H) 0.0 - 100.0 pg/mL   *Note: Due to a large number of results and/or encounters for the requested time period, some results have not been displayed. A complete set of results can be found in Results Review.      03/14/2024    9:38 AM 02/05/2024    2:24 PM 11/07/2023    9:20 AM 09/05/2023    2:31 PM 08/17/2023    1:54 PM  Depression screen PHQ 2/9  Decreased Interest 1 1 1  0 1  Down, Depressed, Hopeless 1 1 1 1 1   PHQ -  2 Score 2 2 2 1 2   Altered sleeping 2 1 1 2  0  Tired, decreased energy 2 3 1 2 1   Change in appetite 1 2 1  0 0  Feeling bad or failure about yourself  0 0 0 0 0  Trouble concentrating 1 0 0 0 0  Moving slowly or fidgety/restless 0 0 0 0 0  Suicidal thoughts 0 0 0 0 0  PHQ-9 Score 8 8 5 5 3   Difficult doing work/chores Somewhat difficult Somewhat difficult Somewhat difficult Somewhat difficult Not difficult at all       03/14/2024    9:39 AM 02/05/2024    2:25 PM 11/07/2023    9:20 AM 09/05/2023    2:31 PM  GAD 7 : Generalized Anxiety Score  Nervous, Anxious, on Edge 2 1 1 1   Control/stop worrying 0 0 1 1  Worry too much - different things 1 0 1 1  Trouble relaxing 1 0 1 1  Restless 0 0 0 0  Easily annoyed or  irritable 0 0 0 0  Afraid - awful might happen 0 0 1 0  Total GAD 7 Score 4 1 5 4   Anxiety Difficulty Somewhat difficult Somewhat difficult Not difficult at all Not difficult at all   Assessment & Plan:   Problem List Items Addressed This Visit     Essential hypertension, benign   Chronic, BP mildly elevated today, adequate for age and comorbidities      COPD (chronic obstructive pulmonary disease)   Chronic severe on latest pulm eval - continue trelegy.       Chronic heart failure with preserved ejection fraction (HFpEF) (HCC) - Primary   Continue lasix  40mg  daily with 2nd dose PRN pedal edema/dyspnea.       Nausea   Stopping oral iron didn't help.  Trial zofran  tablets prn      Mitral valve replaced   Continues eliquis .       Adjustment disorder with mixed anxiety and depressed mood   Chronic, overall stable period on wellbutrin  SR 150mg  daily       Pedal edema   Overall improved - continue lasix  40mg  daily with BID PRN.       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       Fatigue   Chronic, ongoing      Fall with injury   Rollator walker Rx provided today      Imbalance   Rollator walker Rx provided today Previously outpatient PT didn't help.  She is currently undergoing HHPT - has 2 sessions left.       Blood blister   To RLE. Size measured today, discussed home care and monitoring for complications such as infection.         Meds ordered this encounter  Medications   ondansetron  (ZOFRAN ) 4 MG tablet    Sig: Take 1 tablet (4 mg total) by mouth every 8 (eight) hours as needed for nausea or vomiting.    Dispense:  20 tablet    Refill:  0    No orders of the defined types were placed in this encounter.   Patient Instructions  Prescription for lightweight rollator walker provided today.  Try regular zofran  tablets.  Continue other medicines. Return after 06/27/2024  for physical/wellness visit   Follow up plan: Return in about 15 weeks (around 06/27/2024), or if symptoms worsen or fail to  improve, for annual exam, prior fasting for blood work, medicare wellness visit.  Claire Crick, MD

## 2024-03-14 NOTE — Assessment & Plan Note (Signed)
 Chronic, BP mildly elevated today, adequate for age and comorbidities

## 2024-03-14 NOTE — Assessment & Plan Note (Addendum)
 To RLE. Size measured today, discussed home care and monitoring for complications such as infection.

## 2024-03-14 NOTE — Assessment & Plan Note (Signed)
 Rollator walker Rx provided today

## 2024-03-14 NOTE — Assessment & Plan Note (Signed)
 Stopping oral iron didn't help.  Trial zofran  tablets prn

## 2024-03-14 NOTE — Assessment & Plan Note (Signed)
 Rollator walker Rx provided today Previously outpatient PT didn't help.  She is currently undergoing HHPT - has 2 sessions left.

## 2024-03-14 NOTE — Assessment & Plan Note (Signed)
 Overall improved - continue lasix  40mg  daily with BID PRN.

## 2024-03-14 NOTE — Assessment & Plan Note (Addendum)
 Chronic severe on latest pulm eval - continue trelegy.

## 2024-03-18 ENCOUNTER — Inpatient Hospital Stay (HOSPITAL_COMMUNITY)
Admission: EM | Admit: 2024-03-18 | Discharge: 2024-03-22 | DRG: 193 | Disposition: A | Discharge status: Home Health | Attending: Internal Medicine | Admitting: Internal Medicine

## 2024-03-18 ENCOUNTER — Encounter (HOSPITAL_COMMUNITY): Payer: Self-pay

## 2024-03-18 ENCOUNTER — Ambulatory Visit: Payer: Self-pay

## 2024-03-18 ENCOUNTER — Emergency Department (HOSPITAL_COMMUNITY)

## 2024-03-18 DIAGNOSIS — J180 Bronchopneumonia, unspecified organism: Secondary | ICD-10-CM | POA: Diagnosis not present

## 2024-03-18 DIAGNOSIS — Z833 Family history of diabetes mellitus: Secondary | ICD-10-CM

## 2024-03-18 DIAGNOSIS — Z823 Family history of stroke: Secondary | ICD-10-CM

## 2024-03-18 DIAGNOSIS — Z87891 Personal history of nicotine dependence: Secondary | ICD-10-CM

## 2024-03-18 DIAGNOSIS — Z882 Allergy status to sulfonamides status: Secondary | ICD-10-CM

## 2024-03-18 DIAGNOSIS — Z7901 Long term (current) use of anticoagulants: Secondary | ICD-10-CM

## 2024-03-18 DIAGNOSIS — Z952 Presence of prosthetic heart valve: Secondary | ICD-10-CM

## 2024-03-18 DIAGNOSIS — J9601 Acute respiratory failure with hypoxia: Secondary | ICD-10-CM | POA: Diagnosis present

## 2024-03-18 DIAGNOSIS — J189 Pneumonia, unspecified organism: Secondary | ICD-10-CM | POA: Diagnosis present

## 2024-03-18 DIAGNOSIS — Z8673 Personal history of transient ischemic attack (TIA), and cerebral infarction without residual deficits: Secondary | ICD-10-CM

## 2024-03-18 DIAGNOSIS — Z923 Personal history of irradiation: Secondary | ICD-10-CM

## 2024-03-18 DIAGNOSIS — J440 Chronic obstructive pulmonary disease with acute lower respiratory infection: Secondary | ICD-10-CM | POA: Diagnosis present

## 2024-03-18 DIAGNOSIS — Z8619 Personal history of other infectious and parasitic diseases: Secondary | ICD-10-CM

## 2024-03-18 DIAGNOSIS — J441 Chronic obstructive pulmonary disease with (acute) exacerbation: Principal | ICD-10-CM | POA: Diagnosis present

## 2024-03-18 DIAGNOSIS — I2721 Secondary pulmonary arterial hypertension: Secondary | ICD-10-CM | POA: Diagnosis present

## 2024-03-18 DIAGNOSIS — Z801 Family history of malignant neoplasm of trachea, bronchus and lung: Secondary | ICD-10-CM

## 2024-03-18 DIAGNOSIS — I130 Hypertensive heart and chronic kidney disease with heart failure and stage 1 through stage 4 chronic kidney disease, or unspecified chronic kidney disease: Secondary | ICD-10-CM | POA: Diagnosis present

## 2024-03-18 DIAGNOSIS — R7989 Other specified abnormal findings of blood chemistry: Secondary | ICD-10-CM

## 2024-03-18 DIAGNOSIS — E669 Obesity, unspecified: Secondary | ICD-10-CM | POA: Diagnosis present

## 2024-03-18 DIAGNOSIS — Z9049 Acquired absence of other specified parts of digestive tract: Secondary | ICD-10-CM

## 2024-03-18 DIAGNOSIS — Z981 Arthrodesis status: Secondary | ICD-10-CM

## 2024-03-18 DIAGNOSIS — I5033 Acute on chronic diastolic (congestive) heart failure: Secondary | ICD-10-CM | POA: Diagnosis present

## 2024-03-18 DIAGNOSIS — E785 Hyperlipidemia, unspecified: Secondary | ICD-10-CM | POA: Diagnosis present

## 2024-03-18 DIAGNOSIS — Z86007 Personal history of in-situ neoplasm of skin: Secondary | ICD-10-CM

## 2024-03-18 DIAGNOSIS — Z7951 Long term (current) use of inhaled steroids: Secondary | ICD-10-CM

## 2024-03-18 DIAGNOSIS — I4819 Other persistent atrial fibrillation: Secondary | ICD-10-CM | POA: Diagnosis present

## 2024-03-18 DIAGNOSIS — Z91048 Other nonmedicinal substance allergy status: Secondary | ICD-10-CM

## 2024-03-18 DIAGNOSIS — Z8249 Family history of ischemic heart disease and other diseases of the circulatory system: Secondary | ICD-10-CM

## 2024-03-18 DIAGNOSIS — Z85048 Personal history of other malignant neoplasm of rectum, rectosigmoid junction, and anus: Secondary | ICD-10-CM

## 2024-03-18 DIAGNOSIS — Z8616 Personal history of COVID-19: Secondary | ICD-10-CM

## 2024-03-18 DIAGNOSIS — I5032 Chronic diastolic (congestive) heart failure: Secondary | ICD-10-CM

## 2024-03-18 DIAGNOSIS — H548 Legal blindness, as defined in USA: Secondary | ICD-10-CM | POA: Diagnosis present

## 2024-03-18 DIAGNOSIS — I2489 Other forms of acute ischemic heart disease: Secondary | ICD-10-CM | POA: Diagnosis present

## 2024-03-18 DIAGNOSIS — Z885 Allergy status to narcotic agent status: Secondary | ICD-10-CM

## 2024-03-18 DIAGNOSIS — Z888 Allergy status to other drugs, medicaments and biological substances status: Secondary | ICD-10-CM

## 2024-03-18 DIAGNOSIS — N182 Chronic kidney disease, stage 2 (mild): Secondary | ICD-10-CM | POA: Diagnosis present

## 2024-03-18 DIAGNOSIS — Z8781 Personal history of (healed) traumatic fracture: Secondary | ICD-10-CM

## 2024-03-18 DIAGNOSIS — I7121 Aneurysm of the ascending aorta, without rupture: Secondary | ICD-10-CM | POA: Diagnosis present

## 2024-03-18 DIAGNOSIS — Z803 Family history of malignant neoplasm of breast: Secondary | ICD-10-CM

## 2024-03-18 DIAGNOSIS — Z950 Presence of cardiac pacemaker: Secondary | ICD-10-CM

## 2024-03-18 DIAGNOSIS — Z79899 Other long term (current) drug therapy: Secondary | ICD-10-CM

## 2024-03-18 LAB — BASIC METABOLIC PANEL WITH GFR
Anion gap: 9 (ref 5–15)
BUN: 17 mg/dL (ref 8–23)
CO2: 29 mmol/L (ref 22–32)
Calcium: 8.9 mg/dL (ref 8.9–10.3)
Chloride: 100 mmol/L (ref 98–111)
Creatinine, Ser: 0.8 mg/dL (ref 0.44–1.00)
GFR, Estimated: >60 mL/min (ref >60)
Glucose, Bld: 93 mg/dL (ref 70–99)
Potassium: 3.8 mmol/L (ref 3.5–5.1)
Sodium: 138 mmol/L (ref 135–145)

## 2024-03-18 LAB — CBC
HCT: 42.3 % (ref 36.0–46.0)
Hemoglobin: 13.1 g/dL (ref 12.0–15.0)
MCH: 33.7 pg (ref 26.0–34.0)
MCHC: 31 g/dL (ref 30.0–36.0)
MCV: 108.7 fL — ABNORMAL HIGH (ref 80.0–100.0)
Platelets: 81 10*3/uL — ABNORMAL LOW (ref 150–400)
RBC: 3.89 MIL/uL (ref 3.87–5.11)
RDW: 14.5 % (ref 11.5–15.5)
WBC: 6.8 10*3/uL (ref 4.0–10.5)
nRBC: 0 % (ref 0.0–0.2)

## 2024-03-18 LAB — BRAIN NATRIURETIC PEPTIDE: B Natriuretic Peptide: 513.9 pg/mL — ABNORMAL HIGH (ref 0.0–100.0)

## 2024-03-18 LAB — TROPONIN I (HIGH SENSITIVITY): Troponin I (High Sensitivity): 64 ng/L — ABNORMAL HIGH (ref <18)

## 2024-03-18 MED ORDER — METHYLPREDNISOLONE SODIUM SUCC 125 MG IJ SOLR
125.0000 mg | Freq: Once | INTRAMUSCULAR | Status: AC
  Administered 2024-03-19: 125 mg via INTRAVENOUS
  Filled 2024-03-18: qty 2

## 2024-03-18 MED ORDER — IPRATROPIUM-ALBUTEROL 0.5-2.5 (3) MG/3ML IN SOLN
3.0000 mL | Freq: Once | RESPIRATORY_TRACT | Status: AC
  Administered 2024-03-19: 3 mL via RESPIRATORY_TRACT
  Filled 2024-03-18: qty 3

## 2024-03-18 NOTE — ED Provider Notes (Signed)
 [place] EMERGENCY DEPARTMENT AT [facility] Provider Note   CSN: [id number] Arrival date & time: 03/18/24  1533     History {Add pertinent medical, surgical, social history, OB history to HPI:1} Chief Complaint  Patient presents with   Shortness of Breath    [name] is a 78 y.o. female.  HPI      PT checked vital signs, found O2 lower than 90, heard rattling. Called Dr. Bertrum Brodie, said to come in  Feels low energy, but otherwise feeling ok.  In the last few weeks has been more short of breath with less exertion Doesn't lay down flat because of back More feeble with walking in last few weeks per friend [name], seems to give out more quickly Started wearing compression stockings in the fall. She does not think she is having leg swelling. No chest pain Cough, feels like can't cough it up, feels like there is fluid in there Taking 40mg  a day lasix  (will take more if swelling but has not)   Home Medications Prior to Admission medications   Medication Sig Start Date End Date Taking? Authorizing Provider  acetaminophen  (TYLENOL ) 500 MG tablet Take 2 tablets (1,000 mg total) by mouth in the morning and at bedtime. 02/05/24   Claire Crick, MD  acidophilus (RISAQUAD) CAPS capsule Take 1 capsule by mouth daily.    [provider]  albuterol  (VENTOLIN  HFA) 108 (90 Base) MCG/ACT inhaler Inhale 2 puffs into the lungs every 6 (six) hours as needed for wheezing. 06/05/23   [name], MD  ascorbic acid  (VITAMIN C) 500 MG tablet Take 1 tablet (500 mg total) by mouth daily. 10/05/22   Claire Crick, MD  Biotin 1000 MCG tablet Take 1,000 mcg by mouth daily.    [provider]  buPROPion  (WELLBUTRIN  SR) 150 MG 12 hr tablet TAKE 1 TABLET(150 MG) BY MOUTH EVERY MORNING 03/07/24   Claire Crick, MD  calcium  carbonate (OSCAL) 1500 (600 Ca) MG TABS tablet Take 600 mg of elemental calcium  by mouth daily.    [provider]   carboxymethylcellulose (REFRESH PLUS) 0.5 % SOLN Place 1 drop into both eyes 3 (three) times daily as needed (dry eyes).    [provider]  cephALEXin  (KEFLEX ) 250 MG capsule Take 250 mg by mouth daily with breakfast. 07/13/23   [provider]  cholecalciferol (VITAMIN D3) 25 MCG (1000 UNIT) tablet Take 1,000 Units by mouth daily.    [provider]  docusate sodium  (COLACE) 100 MG capsule Take 200 mg by mouth daily.    [provider]  ELIQUIS  5 MG TABS tablet TAKE 1 TABLET(5 MG) BY MOUTH TWICE DAILY Patient taking differently: Take 5 mg by mouth in the morning and at bedtime. 08/24/23   Croitoru, Mihai, MD  Ensifentrine  (OHTUVAYRE ) 3 MG/2.5ML SUSP Inhale into the lungs. Twice daily via neb    [provider]  ferrous sulfate  325 (65 FE) MG EC tablet Take 1 tablet (325 mg total) by mouth daily with breakfast. 09/06/23   Sumner Ends, MD  Fluticasone -Umeclidin-Vilant (TRELEGY ELLIPTA ) 200-62.5-25 MCG/ACT AEPB Inhale 1 puff into the lungs daily. 06/05/23   [name], MD  furosemide  (LASIX ) 40 MG tablet TAKE 1 TABLET BY MOUTH EVERY DAY. MAY TAKE AN EXTRA TABLET AS NEEDED FOR SWELLING Patient taking differently: Take 40 mg by mouth in the morning. 08/24/23   Croitoru, Mihai, MD  gabapentin  (NEURONTIN ) 100 MG capsule Take 1 capsule (100 mg total) by mouth 2 (two) times  daily as needed (back pain - for daytime use). Patient taking differently: Take 100 mg by mouth daily. 04/24/23   Claire Crick, MD  gabapentin  (NEURONTIN ) 300 MG capsule Take 300 mg by mouth at bedtime.    [provider]  hydrocortisone  (ANUSOL -HC) 2.5 % rectal cream APPLY RECTALLY TO THE AFFECTED AREA TWICE DAILY 12/27/23   Claire Crick, MD  lisinopril  (ZESTRIL ) 5 MG tablet Take 1 tablet (5 mg total) by mouth daily. 11/07/23 02/05/24  Claire Crick, MD  lovastatin  (MEVACOR ) 40 MG tablet TAKE 1 TABLET(40 MG) BY MOUTH AT BEDTIME 10/23/23   Claire Crick, MD   metoprolol  succinate (TOPROL  XL) 25 MG 24 hr tablet Take 1 tablet (25 mg total) by mouth every evening. 09/11/23   Thomasena Fleming, NP  montelukast  (SINGULAIR ) 10 MG tablet TAKE 1 TABLET(10 MG) BY MOUTH AT BEDTIME 09/12/23   [name], MD  Multiple Vitamins-Minerals (PRESERVISION AREDS 2 PO) Take 2 capsules by mouth in the morning.    [provider]  omeprazole  (PRILOSEC) 20 MG capsule Take 1 capsule (20 mg total) by mouth daily. 09/25/23   Lajuan Pila, MD  ondansetron  (ZOFRAN ) 4 MG tablet Take 1 tablet (4 mg total) by mouth every 8 (eight) hours as needed for nausea or vomiting. 03/14/24   Claire Crick, MD  Phenazopyrid-Cranbry-C-Probiot (AZO URINARY TRACT SUPPORT PO) Take 1 capsule by mouth daily.    [provider]  polyethylene glycol (MIRALAX  / GLYCOLAX ) packet Take 17 g by mouth daily. Patient taking differently: Take 17 g by mouth daily as needed for mild constipation. 12/02/13   Sherry Dome, PA-C  potassium chloride  (KLOR-CON ) 10 MEQ tablet TAKE 2 TABLETS(20 MEQ) BY MOUTH DAILY 02/09/24   Croitoru, Karyl Paget, MD  promethazine  (PHENERGAN ) 12.5 MG tablet Take 1 tablet (12.5 mg total) by mouth every 8 (eight) hours as needed for nausea or vomiting. 11/29/23   Claire Crick, MD  psyllium (REGULOID) 0.52 g capsule Take 0.52 g by mouth in the morning and at bedtime.    [provider]  SUFLAVE  178.7 g SOLR Take by mouth as directed. 11/20/23   [provider]  traMADol  (ULTRAM ) 50 MG tablet Take 1-2 tablets (50-100 mg total) by mouth 2 (two) times daily as needed for moderate pain (pain score 4-6). 02/06/24   Claire Crick, MD  traZODone  (DESYREL ) 50 MG tablet TAKE 1/2 TO 1 TABLET(25 TO 50 MG) BY MOUTH AT BEDTIME 01/15/24   Claire Crick, MD  Vibegron  (GEMTESA ) 75 MG TABS Take 75 mg by mouth daily.    [provider]  vitamin B-12 (CYANOCOBALAMIN ) 500 MCG tablet Take 1 tablet (500 mcg total) by mouth 2 (two) times a week. 01/06/22    Claire Crick, MD  Zinc  50 MG CAPS Take 1 capsule (50 mg total) by mouth daily. 11/04/19   Claire Crick, MD      Allergies    Tape, Codeine, Prednisone , and Sulfa antibiotics    Review of Systems   Review of Systems  Physical Exam Updated Vital Signs BP (!) 154/77   Pulse 76   Temp 98 F (36.7 C) (Oral)   Resp 18   SpO2 92%  Physical Exam  ED Results / Procedures / Treatments   Labs (all labs ordered are listed, but only abnormal results are displayed) Labs Reviewed  CBC - Abnormal; Notable for the following components:      Result Value   MCV 108.7 (*)    Platelets 81 (*)    All  other components within normal limits  BRAIN NATRIURETIC PEPTIDE - Abnormal; Notable for the following components:   B Natriuretic Peptide 513.9 (*)    All other components within normal limits  BASIC METABOLIC PANEL WITH GFR  TROPONIN I (HIGH SENSITIVITY)  TROPONIN I (HIGH SENSITIVITY)    EKG EKG Interpretation Date/Time:  Monday March 18 2024 15:42:36 EDT Ventricular Rate:  74 PR Interval:  194 QRS Duration:  200 QT Interval:  503 QTC Calculation: 559 R Axis:   -85  Text Interpretation: Atrial-sensed ventricular-paced rhythm Left atrial enlargement Right bundle branch block LVH with IVCD and secondary repol abnrm Prolonged QT interval  No significant change since last tracing  Confirmed by Scarlette Currier ([id number]) on 03/18/2024 9:08:56 PM  Radiology DG Chest 2 View Result Date: 03/18/2024 CLINICAL DATA:  sob and cough EXAM: CHEST - 2 VIEW COMPARISON:  September 04, 2023 FINDINGS: Left chest pacemaker with leads terminating in the right atrium right ventricle. Sternotomy wires. Biapical pleural thickening. Patchy airspace opacities in the right suprahilar region and both lung bases on the frontal radiograph. Small bilateral pleural effusions. No pneumothorax. Aortic and mitral valve replacement. Tortuous aorta with aortic atherosclerosis. No acute fracture or destructive lesions.  Multilevel degenerative disc disease of the spine. IMPRESSION: 1. Patchy airspace opacities in the right suprahilar and both lung bases on the frontal radiograph. While this may represent changes from underlying chronic infection, a superimposed bronchopneumonia would be difficult to exclude. 2. Unchanged small bilateral pleural effusions. Electronically Signed   By: Rance Burrows [name]   On: 03/18/2024 17:43    Procedures Procedures  {Document cardiac monitor, telemetry assessment procedure when appropriate:1}  Medications Ordered in ED Medications - No data to display  ED Course/ Medical Decision Making/ A&P   {   Click here for ABCD2, HEART and other calculatorsREFRESH Note before signing :1}                              Medical Decision Making Amount and/or Complexity of Data Reviewed Labs: ordered. Radiology: ordered.   ***  {Document critical care time when appropriate:1} {Document review of labs and clinical decision tools ie heart score, Chads2Vasc2 etc:1}  {Document your independent review of radiology images, and any outside records:1} {Document your discussion with family members, caretakers, and with consultants:1} {Document social determinants of health affecting pt's care:1} {Document your decision making why or why not admission, treatments were needed:1} Final Clinical Impression(s) / ED Diagnoses Final diagnoses:  None    Rx / DC Orders ED Discharge Orders     None

## 2024-03-18 NOTE — ED Triage Notes (Signed)
 Pt arrives via POV. Pt reports hx of copd and chf. She states her sob has worsened lately and has a cough. Cough is nonproductive. Pt also states her spo2 was 88-90% earlier. Pt does not wear oxygen at baseline. She is currently 96% on RA. NAD. Pt is AxOx4. Denies any increased swelling in her legs. Also denies chest pain.

## 2024-03-18 NOTE — Telephone Encounter (Signed)
 FYI Only or Action Required?: FYI only for provider  Patient is followed in Pulmonology for COPD, last seen on 03/12/2024 by Diane Scot, MD. Called Nurse Triage reporting No chief complaint on file.. Symptoms began today. Interventions attempted: Rescue inhaler, Maintenance inhaler, and Nebulizer treatments. Symptoms are: rapidly worsening.  Triage Disposition: Go to ED Now (Notify PCP)  Patient/caregiver understands and will follow disposition?: Yes  [name], [phone], Physical Therapy currently with the patient.   Copied from CRM [id number]. Topic: Clinical - Red Word Triage >> Mar 18, 2024  2:13 PM [name] wrote: Red Word that prompted transfer to Nurse Triage: Audible wet cough but not productive, O2 is 88-90 (lower than normal), chest sounds bubbly and wheezy.  Didn't go to church yesterday, normal O2 is mid to high 90s. No apparent distress, minor fatigue due to a busy morning.   Caller is [name] with Physical Therapy.   Reason for Disposition  Oxygen level (e.g., pulse oximetry) 90 percent or lower  Answer Assessment - Initial Assessment Questions 1. MAIN CONCERN OR SYMPTOM: "What's your main concern?" (e.g., low oxygen level, breathing difficulty) "What question do you have?"     Wheezing, Bubbly sound, Wet Cough  2. ONSET: "When did the  symptoms  start?"      Today  3. OXYGEN THERAPY:    - "Do you currently use home oxygen?"    - If Yes, ask: "What is your oxygen source?" (e.g., O2 tank, O2 concentrator).    - If Yes, ask: "How do you get the oxygen?" (e.g., nasal prongs, face mask).    - If Yes, ask: "How much oxygen are you supposed to use?" (e.g., 1-2 L [place])     N/A 4.  OXYGEN EQUIPMENT:  "Are you having any trouble with your oxygen equipment?"  (e.g., cannula, mask, tubing, tank, concentrator)     N/A 5. OXYGEN SATURATION MONITOR:     - "Do you use an oxygen saturation monitor (pulse oximeter) at home?"    - If Yes, ask: "Where do you place the probe?"  (e.g., fingertip, ear lobe)     Physical Therapy, [name] is in home.  6. OXYGEN LEVEL: "What is your reading (oxygen level) today?" "What is your usual oxygen saturation reading?" (e.g., 95%)     88-90 7. VSS MONITORING: "Do you monitor/measure your oxygen level or vital signs?" (e.g., yes, no, measurements are automatically sent to provider/call center). Document CURRENT and NORMAL BASELINE values if available.     -  P: "What is your pulse rate per minute?"   -  RR: "What is your respiratory rate per minute?"     73-84 bpm  8. BREATHING DIFFICULTY: "Are you having any difficulty breathing?" If Yes, ask: "How bad is it?"  (e.g., none, mild, moderate, severe)    - MILD: No SOB at rest, mild SOB with walking, speaks normally in sentences, able to lie down, no retractions, pulse < 100.    - MODERATE: SOB at rest, SOB with minimal exertion and prefers to sit, cannot lie down flat, speaks in phrases, mild retractions, audible wheezing, pulse 100-120.    - SEVERE: Very SOB at rest, speaks in single words, struggling to breathe, sitting hunched forward, retractions, pulse > 120       Dyspnea with exertion  9. OTHER SYMPTOMS: "Do you have any other symptoms?" (e.g., fever, change in sputum)       No other symptoms  10. SMOKING: "Do you smoke currently?" "Is there anyone that  smokes around you?"  (Note: smoking around oxygen is dangerous!)       No  Protocols used: Oxygen Monitoring and Hypoxia-A-AH

## 2024-03-19 ENCOUNTER — Other Ambulatory Visit: Payer: Self-pay

## 2024-03-19 ENCOUNTER — Emergency Department (HOSPITAL_COMMUNITY)

## 2024-03-19 DIAGNOSIS — J189 Pneumonia, unspecified organism: Secondary | ICD-10-CM | POA: Diagnosis present

## 2024-03-19 DIAGNOSIS — I5033 Acute on chronic diastolic (congestive) heart failure: Secondary | ICD-10-CM | POA: Diagnosis present

## 2024-03-19 DIAGNOSIS — I2489 Other forms of acute ischemic heart disease: Secondary | ICD-10-CM | POA: Diagnosis present

## 2024-03-19 DIAGNOSIS — R7989 Other specified abnormal findings of blood chemistry: Secondary | ICD-10-CM | POA: Diagnosis not present

## 2024-03-19 DIAGNOSIS — J180 Bronchopneumonia, unspecified organism: Secondary | ICD-10-CM | POA: Diagnosis present

## 2024-03-19 DIAGNOSIS — J441 Chronic obstructive pulmonary disease with (acute) exacerbation: Secondary | ICD-10-CM | POA: Diagnosis present

## 2024-03-19 DIAGNOSIS — Z8249 Family history of ischemic heart disease and other diseases of the circulatory system: Secondary | ICD-10-CM | POA: Diagnosis not present

## 2024-03-19 DIAGNOSIS — Z981 Arthrodesis status: Secondary | ICD-10-CM | POA: Diagnosis not present

## 2024-03-19 DIAGNOSIS — E785 Hyperlipidemia, unspecified: Secondary | ICD-10-CM | POA: Diagnosis present

## 2024-03-19 DIAGNOSIS — Z923 Personal history of irradiation: Secondary | ICD-10-CM | POA: Diagnosis not present

## 2024-03-19 DIAGNOSIS — I130 Hypertensive heart and chronic kidney disease with heart failure and stage 1 through stage 4 chronic kidney disease, or unspecified chronic kidney disease: Secondary | ICD-10-CM | POA: Diagnosis present

## 2024-03-19 DIAGNOSIS — Z8673 Personal history of transient ischemic attack (TIA), and cerebral infarction without residual deficits: Secondary | ICD-10-CM | POA: Diagnosis not present

## 2024-03-19 DIAGNOSIS — N182 Chronic kidney disease, stage 2 (mild): Secondary | ICD-10-CM | POA: Diagnosis present

## 2024-03-19 DIAGNOSIS — J440 Chronic obstructive pulmonary disease with acute lower respiratory infection: Secondary | ICD-10-CM | POA: Diagnosis present

## 2024-03-19 DIAGNOSIS — H548 Legal blindness, as defined in USA: Secondary | ICD-10-CM | POA: Diagnosis present

## 2024-03-19 DIAGNOSIS — Z950 Presence of cardiac pacemaker: Secondary | ICD-10-CM | POA: Diagnosis not present

## 2024-03-19 DIAGNOSIS — I2721 Secondary pulmonary arterial hypertension: Secondary | ICD-10-CM | POA: Diagnosis present

## 2024-03-19 DIAGNOSIS — Z7901 Long term (current) use of anticoagulants: Secondary | ICD-10-CM | POA: Diagnosis not present

## 2024-03-19 DIAGNOSIS — Z952 Presence of prosthetic heart valve: Secondary | ICD-10-CM | POA: Diagnosis not present

## 2024-03-19 DIAGNOSIS — Z8616 Personal history of COVID-19: Secondary | ICD-10-CM | POA: Diagnosis not present

## 2024-03-19 DIAGNOSIS — J9601 Acute respiratory failure with hypoxia: Secondary | ICD-10-CM | POA: Diagnosis present

## 2024-03-19 DIAGNOSIS — Z87891 Personal history of nicotine dependence: Secondary | ICD-10-CM | POA: Diagnosis not present

## 2024-03-19 DIAGNOSIS — I4819 Other persistent atrial fibrillation: Secondary | ICD-10-CM | POA: Diagnosis present

## 2024-03-19 DIAGNOSIS — Z833 Family history of diabetes mellitus: Secondary | ICD-10-CM | POA: Diagnosis not present

## 2024-03-19 DIAGNOSIS — E669 Obesity, unspecified: Secondary | ICD-10-CM | POA: Diagnosis present

## 2024-03-19 DIAGNOSIS — I7121 Aneurysm of the ascending aorta, without rupture: Secondary | ICD-10-CM | POA: Diagnosis present

## 2024-03-19 LAB — TROPONIN I (HIGH SENSITIVITY): Troponin I (High Sensitivity): 76 ng/L — ABNORMAL HIGH (ref <18)

## 2024-03-19 MED ORDER — METHYLPREDNISOLONE SODIUM SUCC 40 MG IJ SOLR
40.0000 mg | Freq: Two times a day (BID) | INTRAMUSCULAR | Status: DC
  Administered 2024-03-19 – 2024-03-20 (×2): 40 mg via INTRAVENOUS
  Filled 2024-03-19 (×2): qty 1

## 2024-03-19 MED ORDER — RISAQUAD PO CAPS
1.0000 | ORAL_CAPSULE | Freq: Every day | ORAL | Status: DC
  Administered 2024-03-19 – 2024-03-22 (×4): 1 via ORAL
  Filled 2024-03-19 (×4): qty 1

## 2024-03-19 MED ORDER — FUROSEMIDE 10 MG/ML IJ SOLN
20.0000 mg | Freq: Once | INTRAMUSCULAR | Status: AC
  Administered 2024-03-19: 20 mg via INTRAVENOUS
  Filled 2024-03-19: qty 4

## 2024-03-19 MED ORDER — IOHEXOL 350 MG/ML SOLN
75.0000 mL | Freq: Once | INTRAVENOUS | Status: AC | PRN
  Administered 2024-03-19: 75 mL via INTRAVENOUS

## 2024-03-19 MED ORDER — TRAMADOL HCL 50 MG PO TABS: 50.0000 mg | ORAL_TABLET | Freq: Two times a day (BID) | ORAL | Status: DC | PRN

## 2024-03-19 MED ORDER — ACETAMINOPHEN 650 MG RE SUPP
650.0000 mg | Freq: Four times a day (QID) | RECTAL | Status: AC | PRN
Start: 2024-03-19 — End: ?

## 2024-03-19 MED ORDER — GABAPENTIN 300 MG PO CAPS
300.0000 mg | ORAL_CAPSULE | Freq: Every day | ORAL | Status: DC
  Administered 2024-03-19 – 2024-03-21 (×3): 300 mg via ORAL
  Filled 2024-03-19 (×3): qty 1

## 2024-03-19 MED ORDER — MONTELUKAST SODIUM 10 MG PO TABS
10.0000 mg | ORAL_TABLET | Freq: Every day | ORAL | Status: DC
  Administered 2024-03-19 – 2024-03-21 (×3): 10 mg via ORAL
  Filled 2024-03-19 (×3): qty 1

## 2024-03-19 MED ORDER — LISINOPRIL 10 MG PO TABS
5.0000 mg | ORAL_TABLET | Freq: Every day | ORAL | Status: DC
  Administered 2024-03-19 – 2024-03-22 (×4): 5 mg via ORAL
  Filled 2024-03-19 (×4): qty 1

## 2024-03-19 MED ORDER — ONDANSETRON HCL 4 MG/2ML IJ SOLN: 4.0000 mg | Freq: Four times a day (QID) | INTRAMUSCULAR | Status: DC | PRN

## 2024-03-19 MED ORDER — BUDESON-GLYCOPYRROL-FORMOTEROL 160-9-4.8 MCG/ACT IN AERO
2.0000 | INHALATION_SPRAY | Freq: Two times a day (BID) | RESPIRATORY_TRACT | Status: DC
  Administered 2024-03-19 – 2024-03-22 (×7): 2 via RESPIRATORY_TRACT
  Filled 2024-03-19: qty 5.9

## 2024-03-19 MED ORDER — METOPROLOL SUCCINATE ER 25 MG PO TB24
25.0000 mg | ORAL_TABLET | Freq: Every evening | ORAL | Status: DC
  Administered 2024-03-19 – 2024-03-21 (×3): 25 mg via ORAL
  Filled 2024-03-19 (×4): qty 1

## 2024-03-19 MED ORDER — APIXABAN 2.5 MG PO TABS
5.0000 mg | ORAL_TABLET | Freq: Two times a day (BID) | ORAL | Status: DC
  Administered 2024-03-19 – 2024-03-22 (×7): 5 mg via ORAL
  Filled 2024-03-19 (×7): qty 2

## 2024-03-19 MED ORDER — TRAZODONE HCL 100 MG PO TABS
50.0000 mg | ORAL_TABLET | Freq: Every day | ORAL | Status: DC
  Administered 2024-03-19 – 2024-03-21 (×3): 50 mg via ORAL
  Filled 2024-03-19 (×3): qty 1

## 2024-03-19 MED ORDER — DOCUSATE SODIUM 100 MG PO CAPS
200.0000 mg | ORAL_CAPSULE | Freq: Every day | ORAL | Status: DC
  Filled 2024-03-19 (×4): qty 2

## 2024-03-19 MED ORDER — ACETAMINOPHEN 325 MG PO TABS
650.0000 mg | ORAL_TABLET | Freq: Four times a day (QID) | ORAL | Status: DC | PRN
  Administered 2024-03-20 (×2): 650 mg via ORAL
  Filled 2024-03-19 (×2): qty 2

## 2024-03-19 MED ORDER — POLYETHYLENE GLYCOL 3350 17 G PO PACK: 17.0000 g | PACK | Freq: Every day | ORAL | Status: DC | PRN

## 2024-03-19 MED ORDER — SODIUM CHLORIDE 0.9 % IV SOLN
500.0000 mg | Freq: Once | INTRAVENOUS | Status: AC
  Administered 2024-03-19: 500 mg via INTRAVENOUS
  Filled 2024-03-19: qty 5

## 2024-03-19 MED ORDER — ENSIFENTRINE 3 MG/2.5ML IN SUSP: 1.0000 | Freq: Two times a day (BID) | RESPIRATORY_TRACT | Status: DC

## 2024-03-19 MED ORDER — BUPROPION HCL ER (SR) 150 MG PO TB12
150.0000 mg | ORAL_TABLET | Freq: Every day | ORAL | Status: DC
  Administered 2024-03-19 – 2024-03-22 (×4): 150 mg via ORAL
  Filled 2024-03-19 (×4): qty 1

## 2024-03-19 MED ORDER — FERROUS SULFATE 325 (65 FE) MG PO TABS
325.0000 mg | ORAL_TABLET | Freq: Every day | ORAL | Status: DC
  Administered 2024-03-19 – 2024-03-22 (×4): 325 mg via ORAL
  Filled 2024-03-19 (×5): qty 1

## 2024-03-19 MED ORDER — PREDNISONE 20 MG PO TABS: 40.0000 mg | ORAL_TABLET | Freq: Every day | ORAL | Status: DC

## 2024-03-19 MED ORDER — PRAVASTATIN SODIUM 20 MG PO TABS
40.0000 mg | ORAL_TABLET | Freq: Every day | ORAL | Status: DC
  Administered 2024-03-20 – 2024-03-22 (×3): 40 mg via ORAL
  Filled 2024-03-19 (×4): qty 2

## 2024-03-19 MED ORDER — SODIUM CHLORIDE 0.9 % IV SOLN
1.0000 g | INTRAVENOUS | Status: DC
  Administered 2024-03-20 – 2024-03-22 (×3): 1 g via INTRAVENOUS
  Filled 2024-03-19 (×3): qty 10

## 2024-03-19 MED ORDER — SODIUM CHLORIDE 0.9 % IV SOLN
500.0000 mg | INTRAVENOUS | Status: DC
  Administered 2024-03-20: 500 mg via INTRAVENOUS
  Filled 2024-03-19: qty 5

## 2024-03-19 MED ORDER — ONDANSETRON HCL 4 MG PO TABS: 4.0000 mg | ORAL_TABLET | Freq: Four times a day (QID) | ORAL | Status: DC | PRN

## 2024-03-19 MED ORDER — FUROSEMIDE 10 MG/ML IJ SOLN
20.0000 mg | Freq: Every day | INTRAMUSCULAR | Status: DC
  Administered 2024-03-20: 20 mg via INTRAVENOUS
  Filled 2024-03-19 (×3): qty 2

## 2024-03-19 MED ORDER — SODIUM CHLORIDE 0.9 % IV SOLN
1.0000 g | Freq: Once | INTRAVENOUS | Status: AC
  Administered 2024-03-19: 1 g via INTRAVENOUS
  Filled 2024-03-19: qty 10

## 2024-03-19 MED ORDER — ALBUTEROL SULFATE (2.5 MG/3ML) 0.083% IN NEBU: 2.5000 mg | INHALATION_SOLUTION | RESPIRATORY_TRACT | Status: DC | PRN

## 2024-03-19 MED ORDER — POLYVINYL ALCOHOL 1.4 % OP SOLN: 1.0000 [drp] | Freq: Three times a day (TID) | OPHTHALMIC | Status: DC | PRN

## 2024-03-19 NOTE — H&P (Addendum)
 History and Physical  [name] ZOX:[id number] DOB: 1945-11-10 DOA: 03/18/2024  PCP: Claire Crick, MD   Chief Complaint: Cough, shortness of breath  HPI: [name] is a 78 y.o. female with medical history significant for secondary AV block, anxiety, atrial fibrillation on Eliquis , chronic diastolic heart failure and COPD on room air at baseline being admitted to the hospital with acute hypoxic respiratory failure due to acute exacerbation of diastolic heart failure and community-acquired pneumonia.  Patient states that she saw her pulmonologist Dr. Bertrum Brodie last week and was doing well overall.  Since that time she has developed nonproductive cough, and mild dyspnea.  Yesterday PT came to her house to work with her, and noticed that she seemed short of breath and was wheezing on exam.  Her pulmonology office was contacted, they recommended she come to the ER for evaluation.  As described below, there is evidence of possible CHF exacerbation and bilateral basilar community-acquired pneumonia.  Patient was given diuretic, started on empiric IV antibiotics.  She is requiring 4 L nasal cannula oxygen, so hospitalist admission was requested.  Review of Systems: Please see HPI for pertinent positives and negatives. A complete 10 system review of systems are otherwise negative.  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 Known DISH with thoracic spine fusion and moderate kyphosis, h/o L spine fusion L2-5   Adjustment disorder with depressed mood Oct 26, 2014   46 yo son died MI 2014-10-04 Husband died car accident 2015-04-05   Anemia    Anticoagulant long-term use 06/14/2019   She takes warfarin for afib/flutter with CHADS2VASc score of 4 (age, sex, CHF, HTN),  s/p 2 bioprosthetic valve replacements Coumadin  changed to eliquis  2021-04-04 per cardiology   Aortic atherosclerosis 03/03/2022   Noted on 01/2022 chest CT   Arthritis    Arthropathy of spinal facet joint 03/08/2018   Ascending aortic aneurysm 03/02/2021   4.1cm on CT 01/2021 rec yearly monitoring   Bilateral hip pain 09/08/2016   Cancer (HCC)    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 Myelogram showed significant left sided foraminal stenosis at L2/3 and L3/4 in setting of solid arthrodesis, planned DEXA and if stable osteopenia, considering anterolateral decompression with spacer.    CKD (chronic kidney disease) stage 2, GFR 60-89 ml/min 07/03/2015   Constipation    takes Miralax  daily as needed   COPD (chronic obstructive pulmonary disease)    Albuterol  inhaler daily as needed;Duoneb daily as needed;Spiriva  daily   COVID-19 virus infection 11/11/2022   DDD (degenerative disc disease)    cervical - kyphosis with mod DD changes C5/6 and C6/7; lumbar - early DD at L2/3 (Elsner)   Depression    DISH (diffuse idiopathic skeletal hyperostosis) 05/14/2019  By MRI noted fusion of lower thoracic vertebrae - likely contributing to unsteadiness (Elsner)   Dyspnea on exertion 06/05/2015   myoview  low risk 02/2018   Edema of both lower legs due to peripheral venous insufficiency    Essential hypertension, benign 03/24/2009   hx of not on nmeds currenlty and has been  a while since on meds per pt   Heart murmur    Hematuria 06/17/2021   History of kidney stones    History of radiation therapy 05/30/11 to 07/07/11   rectum   History of rectal cancer 05/26/2011   Rectal cancer 2012, midrectum [id number], s/p neoadj chemo&XRT, lap LAR with diverting loop ileostomy 08/2011, ileostomy takedown 03/2012 Discharged from oncology clinic 2017 Scherrie Curt)   History of shingles    History of vertebral fracture 11/26/2019   Old L1 vertebral compression fracture incidentally noted on imaging 2020    Hyperlipemia    takes Lovastatin  daily   Lacunar infarction    Left caudate   Left hip pain 07/29/2020   Legally blind in left eye    Lymphangioma 01/07/2021   Noted incidentally on carotid US  12/2020 CT neck - Likely benign lymphangioma 01/2021   Macrocytosis 03/27/2022   Folate and b12 checked 2023   Malaise and fatigue 01/22/2018   Meralgia paresthetica 12/13/2007   Mixed stress and urge urinary incontinence 03/16/2015   Failed oxybutynin  IR/ER. Vesicare  initially helped but then no longer helpful.  myrbetriq  was not effective.  Saw urology (MacDiarmid) - UDS largely overactive bladder with mild-mod stress incontinence. Improved on abx course - maintained on macrodantin  and toviaz .    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   Pulmonary infiltrates 04/23/2019   06/13/2018-CT super D chest without contrast- generally stable chronic lung disease with pleural parenchymal scarring and scattered nodularity most of these nodules have been tree-in-bud in appearance likely postinfectious or inflammatory the dominant right lower lobe nodule seen on most recent study has resolved no new or enlarging nodules  04/22/2019-CT chest without contrast- waxing and waning per   Pulmonary nodule 02/23/2012   RLL nodule-64mm stable 2006, April 2009, and June 2009 Small pulm nodules Jan 2012 - > Oct 2012 without change   Rheumatic heart disease mitral stenosis    mod MS by echo 02/2014   Right leg swelling 06/11/2020   R venous US  06/2020 WNL: - No evidence of deep vein thrombosis in the lower extremity. No indirect evidence of obstruction proximal to the inguinal ligament. - No cystic structure found in the popliteal fossa.   S/P aortic and mitral valve bioprostheses 12/08/2013   AVR 21 mm, MVR  25 mm-MagnaEase pericardia   S/P AVR (aortic valve replacement) 2015   bioprosthetic (Bartle)   S/P MVR (mitral valve replacement) 2015   bioprosthetic (Bartle)   Sepsis secondary to UTI (HCC) 08/22/2017   Severe aortic valve stenosis 10/01/2013   mild-mod by echo 02/2014   Skin lesion 01/03/2022   Small bowel obstruction, partial 08/2013   reolved without NGT placement.    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   Dr Amy Swaz[place]   Syncope 05/28/2018   Thrombocytopenia 11/04/2019   Typical atrial flutter 05/15/2019   Vitamin B12 deficiency 02/28/2015   Start B12 shots 02/2015    Vitamin D  deficiency 10/23/2016   Wounds, multiple    bilateral legs    Past Surgical History:  Procedure Laterality Date   ANTERIOR LAT LUMBAR FUSION Left 07/17/2018   Procedure: Lumbar Two-Three Lumbar Three-Four Anterolateral decompression/interbody fusion with lateral plate fixation/Infuse;  Surgeon: Elna Haggis, MD;  Location: [facility] OR;  Service: Neurosurgery;  Laterality: Left;  Lumbar Two-Three Lumbar Three-Four Anterolateral decompression/interbody fusion with lateral plate fixation/Infuse   AORTIC VALVE REPLACEMENT N/A 12/12/2013   Procedure: AORTIC VALVE REPLACEMENT (AVR);  Surgeon: Bartley Lightning, MD; Service: Open Heart Surgery   BACK SURGERY  2006, 2007   2006 SPACER, 2007 decompression and fusion L4/5   BOWEL RESECTION  04/02/2012   Procedure: SMALL BOWEL RESECTION;  Surgeon: Lockie Rima, MD;  Location: [facility] ORS;  Service: General;  Laterality: N/A;   BRONCHIAL WASHINGS  03/24/2021   Procedure: BRONCHIAL WASHINGS;  Surgeon: [name], MD;  Location: [facility] ENDOSCOPY;  Service: Endoscopy;;   CARDIOVERSION N/A 08/29/2023   Procedure: CARDIOVERSION (CATH LAB);  Surgeon: Lenise Quince, MD;  Location: [facility] INVASIVE CV LAB;  Service: Cardiovascular;  Laterality: N/A;   CARPAL TUNNEL RELEASE Bilateral    CATARACT EXTRACTION W/ INTRAOCULAR LENS IMPLANT Left 10/18/2016   CATARACT EXTRACTION W/ INTRAOCULAR LENS IMPLANT Right 12/13/2016   Dr. Matthew Songster   CHOLECYSTECTOMY N/A 01/21/2015   chronic cholecystitis, Lockie Rima, MD   COLON RESECTION  08/25/2011   Procedure: COLON RESECTION LAPAROSCOPIC;  Surgeon: Lockie Rima, MD;  Location: [facility];  Service: General;  Laterality: N/A;  Laparoscopic Assisted Low Anterior Resection Diverting Ostomy and onQ pain pump   COLONOSCOPY  03/2013   1 polyp, rpt 3 yrs Howard Macho)   COLONOSCOPY  05/2021   diverticulosis and patent colon anastomosis with some edema/narrowing, rpt 5 yrs Howard Macho)   COLONOSCOPY WITH PROPOFOL  N/A 06/09/2016   patent colo-colonic anastomosis, rpt 5 yrs Howard Macho)   COLONOSCOPY WITH PROPOFOL   N/A 01/25/2024   TA, fair prep (consider repeat if needed), patent rectal anastomosis with congestion edema and stenosis, mild sigmoid diverticulosis Lajuan Pila, MD)   CYSTOSCOPY/URETEROSCOPY/HOLMIUM LASER/STENT PLACEMENT Right 07/03/2023   Procedure: CYSTOSCOPY RIGHT RETROGRADE PYEPLGRAM RIGHT URETEROSCOPY/HOLMIUM LASER/STENT PLACEMENT;  Surgeon: Samson Croak, MD;  Location: [facility];  Service: Urology;  Laterality: Right;  1 HR FOR CASE   CYSTOSCOPY/URETEROSCOPY/HOLMIUM LASER/STENT PLACEMENT Right 07/17/2023   Procedure: CYSTOSCOPY/RIGHT URETEROSCOPY/STENT EXCHANGE;  Surgeon: Samson Croak, MD;  Location: [facility];  Service: Urology;  Laterality: Right;  60 MINS FOR CASE   EP IMPLANTABLE DEVICE N/A 06/16/2016   Procedure: Pacemaker Implant;  Surgeon: Will Cortland Ding, MD;  Location: [facility] INVASIVE CV LAB;  Service: Cardiovascular;  Laterality: N/A;   ESOPHAGOGASTRODUODENOSCOPY (EGD) WITH PROPOFOL  N/A 01/25/2024   diffuse gastritis benign biopsy Lajuan Pila, MD)   FOOT SURGERY  left foot   hammer toe  ILEOSTOMY  08/25/2011   ILEOSTOMY CLOSURE  04/02/2012   Procedure: ILEOSTOMY TAKEDOWN;  Surgeon: Lockie Rima, MD;  Location: [facility];  Service: General;  Laterality: N/A;   INTRAOPERATIVE TRANSESOPHAGEAL ECHOCARDIOGRAM N/A 12/12/2013   Procedure: INTRAOPERATIVE TRANSESOPHAGEAL ECHOCARDIOGRAM;  Surgeon: Bartley Lightning, MD;  Location: [facility] OR;  Service: Open Heart Surgery;  Laterality: N/A;   LEFT AND RIGHT HEART CATHETERIZATION WITH CORONARY ANGIOGRAM N/A 11/27/2013   Procedure: LEFT AND RIGHT HEART CATHETERIZATION WITH CORONARY ANGIOGRAM;  Surgeon: Luana Rumple, MD;  Location: [facility] CATH LAB;  Service: Cardiovascular;  Laterality: N/A;   MITRAL VALVE REPLACEMENT N/A 12/12/2013   Procedure: MITRAL VALVE (MV) REPLACEMENT OR REPAIR;  Surgeon: Bartley Lightning, MD; Service: Open Heart Surgery   POLYPECTOMY  01/25/2024   Procedure: POLYPECTOMY, INTESTINE;  Surgeon: Lajuan Pila, MD;   Location: [facility];  Service: Gastroenterology;;   TEE WITHOUT CARDIOVERSION N/A 11/29/2013   Procedure: TRANSESOPHAGEAL ECHOCARDIOGRAM (TEE);  Surgeon: Jacqueline Matsu, MD;  Location: [facility] ENDOSCOPY;  Service: Cardiovascular;  Laterality: N/A;   TEE WITHOUT CARDIOVERSION N/A 06/04/2019   Procedure: TRANSESOPHAGEAL ECHOCARDIOGRAM (TEE);  Surgeon: Luana Rumple, MD;  Location: [facility] ENDOSCOPY;  Service: Cardiovascular;  Laterality: N/A;   TONSILLECTOMY     TRANSESOPHAGEAL ECHOCARDIOGRAM (CATH LAB) N/A 08/29/2023   Procedure: TRANSESOPHAGEAL ECHOCARDIOGRAM;  Surgeon: Lenise Quince, MD;  Location: [facility] INVASIVE CV LAB;  Service: Cardiovascular;  Laterality: N/A;   VIDEO BRONCHOSCOPY N/A 03/24/2021   Procedure: VIDEO BRONCHOSCOPY WITHOUT FLUORO;  Surgeon: [name], MD;  Location: [facility] ENDOS[facility];  Service: Endoscopy;  Laterality: N/A;  SMALL SCOPE,PREMEDICATE WITH NEBULIZED LIDOCAINE    Social History:  reports that she quit smoking about 49 years ago. Her smoking use included cigarettes. She started smoking about 59 years ago. She has a 15 pack-year smoking history. She has never used smokeless tobacco. She reports that she does not drink alcohol  and does not use drugs.  Allergies  Allergen Reactions   Tape Other (See Comments)    "THICK TAPES PULLS OFF MY SKIN"   Codeine Nausea Only   Prednisone  Nausea Only   Sulfa Antibiotics Nausea Only    Family History  Problem Relation Age of Onset   Heart disease Mother    Diabetes Mother    Hypertension Mother    Stroke Mother    Lung cancer Father    Breast cancer Maternal Aunt 39   Heart attack Maternal Grandfather    CAD Son 57   Heart attack Son 24   Rectal cancer Neg Hx    Stomach cancer Neg Hx    Esophageal cancer Neg Hx    Colon cancer Neg Hx      Prior to Admission medications   Medication Sig Start Date End Date Taking? Authorizing Provider  acetaminophen  (TYLENOL ) 500 MG tablet Take 2 tablets (1,000 mg total) by mouth in  the morning and at bedtime. 02/05/24  Yes Claire Crick, MD  albuterol  (VENTOLIN  HFA) 108 [phone] Base) MCG/ACT inhaler Inhale 2 puffs into the lungs every 6 (six) hours as needed for wheezing. 06/05/23  Yes [name], MD  ascorbic acid  (VITAMIN C) 500 MG tablet Take 1 tablet (500 mg total) by mouth daily. 10/05/22  Yes Claire Crick, MD  Biotin 1000 MCG tablet Take 1,000 mcg by mouth daily.   Yes [provider]  buPROPion  (WELLBUTRIN  SR) 150 MG 12 hr tablet TAKE 1 TABLET(150 MG) BY MOUTH EVERY MORNING 03/07/24  Yes Claire Crick, MD  calcium  carbonate (OSCAL) 1500 (600 Ca) MG  TABS tablet Take 600 mg of elemental calcium  by mouth daily.   Yes [provider]  carboxymethylcellulose (REFRESH PLUS) 0.5 % SOLN Place 1 drop into both eyes 3 (three) times daily as needed (dry eyes).   Yes [provider]  cephALEXin  (KEFLEX ) 250 MG capsule Take 250 mg by mouth daily with breakfast. 07/13/23  Yes [provider]  cholecalciferol (VITAMIN D3) 25 MCG (1000 UNIT) tablet Take 1,000 Units by mouth daily.   Yes [provider]  docusate sodium  (COLACE) 100 MG capsule Take 200 mg by mouth daily.   Yes [provider]  ELIQUIS  5 MG TABS tablet TAKE 1 TABLET(5 MG) BY MOUTH TWICE DAILY Patient taking differently: Take 5 mg by mouth in the morning and at bedtime. 08/24/23  Yes Croitoru, Mihai, MD  Ensifentrine  (OHTUVAYRE ) 3 MG/2.5ML SUSP Inhale 1 Inhalation into the lungs in the morning and at bedtime. Twice daily via neb   Yes [provider]  ferrous sulfate  325 (65 FE) MG EC tablet Take 1 tablet (325 mg total) by mouth daily with breakfast. 09/06/23  Yes Sumner Ends, MD  Fluticasone -Umeclidin-Vilant (TRELEGY ELLIPTA ) 200-62.5-25 MCG/ACT AEPB Inhale 1 puff into the lungs daily. 06/05/23  Yes [name], MD  furosemide  (LASIX ) 40 MG tablet TAKE 1 TABLET BY MOUTH EVERY DAY. MAY TAKE AN EXTRA TABLET AS NEEDED FOR SWELLING Patient  taking differently: Take 40 mg by mouth in the morning. 08/24/23  Yes Croitoru, Mihai, MD  gabapentin  (NEURONTIN ) 300 MG capsule Take 300 mg by mouth at bedtime.   Yes [provider]  hydrocortisone  (ANUSOL -HC) 2.5 % rectal cream APPLY RECTALLY TO THE AFFECTED AREA TWICE DAILY 12/27/23  Yes Claire Crick, MD  lisinopril  (ZESTRIL ) 5 MG tablet Take 5 mg by mouth daily.   Yes [provider]  lovastatin  (MEVACOR ) 40 MG tablet TAKE 1 TABLET(40 MG) BY MOUTH AT BEDTIME 10/23/23  Yes Claire Crick, MD  metoprolol  succinate (TOPROL  XL) 25 MG 24 hr tablet Take 1 tablet (25 mg total) by mouth every evening. 09/11/23  Yes Ollis, Brandi L, NP  montelukast  (SINGULAIR ) 10 MG tablet TAKE 1 TABLET(10 MG) BY MOUTH AT BEDTIME 09/12/23  Yes [name], MD  Multiple Vitamins-Minerals (PRESERVISION AREDS 2 PO) Take 2 capsules by mouth in the morning.   Yes [provider]  ondansetron  (ZOFRAN ) 4 MG tablet Take 1 tablet (4 mg total) by mouth every 8 (eight) hours as needed for nausea or vomiting. 03/14/24  Yes Claire Crick, MD  Phenazopyrid-Cranbry-C-Probiot (AZO URINARY TRACT SUPPORT PO) Take 1 capsule by mouth daily.   Yes [provider]  polyethylene glycol (MIRALAX  / GLYCOLAX ) packet Take 17 g by mouth daily. Patient taking differently: Take 17 g by mouth daily as needed for mild constipation. 12/02/13  Yes Sherry Dome, PA-C  potassium chloride  (KLOR-CON ) 10 MEQ tablet TAKE 2 TABLETS(20 MEQ) BY MOUTH DAILY 02/09/24  Yes Croitoru, Mihai, MD  promethazine  (PHENERGAN ) 12.5 MG tablet Take 1 tablet (12.5 mg total) by mouth every 8 (eight) hours as needed for nausea or vomiting. 11/29/23  Yes Claire Crick, MD  traMADol  (ULTRAM ) 50 MG tablet Take 1-2 tablets (50-100 mg total) by mouth 2 (two) times daily as needed for moderate pain (pain score 4-6). 02/06/24  Yes Claire Crick, MD  traZODone  (DESYREL ) 50 MG tablet TAKE 1/2 TO 1 TABLET(25 TO 50 MG) BY MOUTH AT  BEDTIME Patient taking differently: Take 50 mg by mouth at bedtime. 01/15/24  Yes Claire Crick, MD  Vibegron  (GEMTESA ) 75 MG TABS Take 75 mg by mouth daily.   Yes [provider]  vitamin B-12 (CYANOCOBALAMIN ) 500 MCG tablet Take 500 mcg by mouth 2 (two) times a week.   Yes [provider]  Zinc  50 MG CAPS Take 1 capsule (50 mg total) by mouth daily. 11/04/19  Yes Claire Crick, MD  acidophilus [facility]) CAPS capsule Take 1 capsule by mouth daily.    [provider]  gabapentin  (NEURONTIN ) 100 MG capsule Take 1 capsule (100 mg total) by mouth 2 (two) times daily as needed (back pain - for daytime use). Patient not taking: Reported on 03/19/2024 04/24/23   Claire Crick, MD  lisinopril  (ZESTRIL ) 5 MG tablet Take 1 tablet (5 mg total) by mouth daily. 11/07/23 02/05/24  Claire Crick, MD  omeprazole  (PRILOSEC) 20 MG capsule Take 1 capsule (20 mg total) by mouth daily. Patient not taking: Reported on 03/19/2024 09/25/23   Lajuan Pila, MD    Physical Exam: BP (!) 140/71   Pulse 82   Temp 98 F (36.7 C)   Resp 17   Ht 5\' 4"  (1.626 m)   Wt 65 kg   SpO2 97%   BMI 24.60 kg/m  General:  Alert, oriented, calm, in no acute distress, resting comfortably on 4 L.  No cough. Eyes: EOMI, clear conjuctivae, white sclerea Neck: supple, no masses, trachea mildline  Cardiovascular: RRR, no murmurs or rubs, no peripheral edema  Respiratory: Breath sounds are distant with diffuse rhonchi, but she has good equal bilateral air entry.  No crackles, no expiratory wheezing. Abdomen: soft, nontender, nondistended, normal bowel tones heard  Skin: dry, no rashes  Musculoskeletal: no joint effusions, normal range of motion  Psychiatric: appropriate affect, normal speech  Neurologic: extraocular muscles intact, clear speech, moving all extremities with intact sensorium         Labs on Admission:  Basic Metabolic Panel: Recent Labs  Lab 03/18/24 1615  NA 138  K 3.8  CL  100  CO2 29  GLUCOSE 93  BUN 17  CREATININE 0.80  CALCIUM  8.9   Liver Function Tests: No results for input(s): "AST", "ALT", "ALKPHOS", "BILITOT", "PROT", "ALBUMIN " in the last 168 hours. No results for input(s): "LIPASE", "AMYLASE" in the last 168 hours. No results for input(s): "AMMONIA" in the last 168 hours. CBC: Recent Labs  Lab 03/18/24 1615  WBC 6.8  HGB 13.1  HCT 42.3  MCV 108.7*  PLT 81*   Cardiac Enzymes: No results for input(s): "CKTOTAL", "CKMB", "CKMBINDEX", "TROPONINI" in the last 168 hours. BNP (last 3 results) Recent Labs    08/27/23 1052 03/18/24 1615  BNP 293.9* 513.9*    ProBNP (last 3 results) Recent Labs    02/05/24 1519  PROBNP 754.0*    CBG: No results for input(s): "GLUCAP" in the last 168 hours.  Radiological Exams on Admission: CT Angio Chest PE W and/or Wo Contrast Result Date: 03/19/2024 CLINICAL DATA:  High probability of pulmonary embolism. Symptoms of shortness of breath cough. Evaluation for pneumonia chronic lung disease. History of COPD and CHF. Worsening shortness of breath lately. EXAM: CT ANGIOGRAPHY CHEST WITH CONTRAST TECHNIQUE: Multidetector CT imaging of the chest was performed using the standard protocol during bolus administration of intravenous contrast. Multiplanar CT image reconstructions and MIPs were obtained to evaluate the vascular anatomy. RADIATION DOSE REDUCTION: This exam was performed according to the departmental dose-optimization program which includes automated exposure control, adjustment of the mA and/or kV according to patient size and/or use of  iterative reconstruction technique. CONTRAST:  75mL OMNIPAQUE  IOHEXOL  350 MG/ML SOLN COMPARISON:  Chest radiograph 03/18/2024 and CT 05/29/2023 FINDINGS: Cardiovascular: Dilated main pulmonary artery measuring 39 mm. Negative for acute pulmonary embolism. Cardiomegaly. No pericardial effusion. Coronary artery and aortic atherosclerotic calcification. Aortic valve  replacement. 42 mm ascending aortic aneurysm. 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 Mediastinum/Nodes: Trachea and esophagus are unremarkable. No thoracic adenopathy. Lungs/Pleura: Right apical pleural-parenchymal scarring. Patchy ground-glass opacities in the bilateral lower lungs. Or confluent subpleural and peribronchovascular consolidative opacities in the lower lobes and lingula. Small left greater than right pleural effusions. No pneumothorax. Diffuse bronchial wall thickening and mucous plugging in the lower lobes. Scattered centrilobular micro nodules. Upper Abdomen: No acute abnormality. Musculoskeletal: Sternotomy.  No acute fracture. Review of the MIP images confirms the above findings. IMPRESSION: 1. Negative for acute pulmonary embolism. 2. Patchy ground-glass opacities in the bilateral lower lungs with more confluent subpleural and peribronchovascular consolidative opacities in the lower lobes and lingula. Findings are concerning for bronchopneumonia and/or aspiration. A component pulmonary edema may be present as well. 3. Small left greater than right pleural effusions. 4. Dilated main pulmonary artery measuring 39 mm, can be seen with pulmonary hypertension. 5. 42 mm ascending aortic aneurysm. Recommend annual imaging followup by CTA or MRA. This recommendation follows 2010 ACCF/AHA/AATS/ACR/ASA/SCA/SCAI/SIR/STS/SVM Guidelines for the Diagnosis and Management of Patients with Thoracic Aortic Disease. Circulation. 2010; 121: Z610-R604. Aortic aneurysm NOS (ICD10-I71.9) 6. Aortic Atherosclerosis (ICD10-I70.0). Electronically Signed   By: Rozell Cornet [name]   On: 03/19/2024 01:11   DG Chest 2 View Result Date: 03/18/2024 CLINICAL DATA:  sob and cough EXAM: CHEST - 2 VIEW COMPARISON:  September 04, 2023 FINDINGS: Left chest pacemaker with leads terminating in the right atrium right ventricle. Sternotomy wires. Biapical pleural thickening. Patchy airspace  opacities in the right suprahilar region and both lung bases on the frontal radiograph. Small bilateral pleural effusions. No pneumothorax. Aortic and mitral valve replacement. Tortuous aorta with aortic atherosclerosis. No acute fracture or destructive lesions. Multilevel degenerative disc disease of the spine. IMPRESSION: 1. Patchy airspace opacities in the right suprahilar and both lung bases on the frontal radiograph. While this may represent changes from underlying chronic infection, a superimposed bronchopneumonia would be difficult to exclude. 2. Unchanged small bilateral pleural effusions. Electronically Signed   By: Rance Burrows [name]   On: 03/18/2024 17:43   Assessment/Plan Dewanda Talaysia Pinheiro is a 78 y.o. female with medical history significant for secondary AV block, anxiety, atrial fibrillation on Eliquis , chronic diastolic heart failure and COPD on room air at baseline being admitted to the hospital with acute hypoxic respiratory failure due to acute exacerbation of diastolic heart failure and community-acquired pneumonia.  Acute respiratory failure with hypoxia-suspected to be due to combination of community-acquired pneumonia, as well as mild heart failure with preserved EF -Inpatient admission to MedSurg -Supplemental oxygen, wean as tolerated for goal O2 saturation greater than 90% -Treat heart failure and community-acquired pneumonia as below  Community-acquired pneumonia-with patchy groundglass changes in the bilateral lower lungs concerning for possible bronchopneumonia. -Empiric IV azithromycin  and IV Rocephin   History of atrial flutter-continue beta-blocker and Eliquis   Acute on chronic heart failure with preserved EF-patient does not look particularly fluid overloaded on exam, however she has elevated BNP on presentation, and CT findings with some pulmonary edema present.  Received IV Lasix  20 mg in the ER. -Continue home Toprol -XL, lisinopril  -IV Lasix  20 mg  daily  Elevated troponin-mild elevation, without EKG changes.  Trend is flat.  Likely mild troponin leak due to hypoxia.  No further workup planned.  COPD-with hypoxia as above and diffuse rhonchi.  There may be a component of acute COPD exacerbation. -Continue long-acting inhaled bronchodilators -Received IV Solu-Medrol  in the ER, will continue this at 40 mg every 12 hours -Incentive spirometer and flutter valve -Supplemental oxygen as above, wean as tolerated  DVT prophylaxis: Eliquis     Code Status: Full Code  Consults called: None  Admission status: The appropriate patient status for this patient is INPATIENT. Inpatient status is judged to be reasonable and necessary in order to provide the required intensity of service to ensure the patient's safety. The patient's presenting symptoms, physical exam findings, and initial radiographic and laboratory data in the context of their chronic comorbidities is felt to place them at high risk for further clinical deterioration. Furthermore, it is not anticipated that the patient will be medically stable for discharge from the hospital within 2 midnights of admission.    I certify that at the point of admission it is my clinical judgment that the patient will require inpatient hospital care spanning beyond 2 midnights from the point of admission due to high intensity of service, high risk for further deterioration and high frequency of surveillance required  Time spent: 48 minutes  Blondine Hottel Rickey Charm MD [facility] Pager [phone]  If 7PM-7AM, please contact night-coverage [email or web address] Password [email or web address]  03/19/2024, 7:23 AM

## 2024-03-19 NOTE — ED Provider Notes (Signed)
 I assumed care of this patient from previous provider.  Please see their note for further details of history, exam, and MDM.   Briefly patient is a 78 y.o. female who presented shortness of breath.  Pending CTA  CT scan notable for bronchopneumonia.  Patient started on antibiotics.  Also notable for mild pulmonary edema and patient has elevated BNP.  Light diuresing initiated.  Admitted to medicine for further management.      Lindle Rhea, MD 03/19/24 [phone]

## 2024-03-19 NOTE — Plan of Care (Signed)

## 2024-03-19 NOTE — Progress Notes (Signed)
 RT Note: Pt. given/instructed/started on Flutter device per order, Flutter Valve/Routine Prn order, tolerated well and able to use independently.

## 2024-03-19 NOTE — Telephone Encounter (Signed)
 Agree go ER

## 2024-03-19 NOTE — TOC Initial Note (Signed)
 Transition of Care [facility]) - Initial/Assessment Note    Patient Details  Name: [name] MRN: [id number] Date of Birth: 1946-08-06  Transition of Care [facility]) CM/SW Contact:    Kathryn Parish, RN Phone Number: 03/19/2024, 3:54 PM  Clinical Narrative:                 Patient presented for pneumonia. PTA lives alone in a house; PCP/insurance verified; DME, cane & BCS, active with [facility] PT patient can't remember agency; No oxygen; No SDOH needs; Patient friend Pasco Bond [phone] will transport home at discharge. TOC will follow for progression to discharge.  Expected Discharge Plan: Home/Self Care Barriers to Discharge: Continued Medical Work up   Patient Goals and CMS Choice Patient states their goals for this hospitalization and ongoing recovery are:: Home with self CMS Medicare.gov Compare Post Acute Care list provided to::  (NA) Choice offered to / list presented to : NA  ownership interest in [facility].provided to:: Parent NA    Expected Discharge Plan and Services In-house Referral: NA Discharge Planning Services: CM Consult Post Acute Care Choice: NA Living arrangements for the past 2 months: Single Family Home                 DME Arranged: N/A DME Agency: NA       HH Arranged: NA HH Agency: NA        Prior Living Arrangements/Services Living arrangements for the past 2 months: Single Family Home Lives with:: Self Patient language and need for interpreter reviewed:: Yes Do you feel safe going back to the place where you live?: Yes      Need for Family Participation in Patient Care: No (Comment) Care giver support system in place?: Yes (comment) Current home services: DME (cane, BSC) Criminal Activity/Legal Involvement Pertinent to Current Situation/Hospitalization: No - Comment as needed  Activities of Daily Living   ADL Screening (condition at time of admission) Independently performs ADLs?: Yes (appropriate for  developmental age) Is the patient deaf or have difficulty hearing?: Yes Does the patient have difficulty seeing, even when wearing glasses/contacts?: No Does the patient have difficulty concentrating, remembering, or making decisions?: No  Permission Sought/Granted Permission sought to share information with : Case Manager Permission granted to share information with : Yes, Verbal Permission Granted  Share Information with NAME: Bev Adam Holm and Pasco Bond     Permission granted to share info w Relationship: Friends  Permission granted to share info w Contact Information: [phone]   [phone]  Emotional Assessment Appearance:: Appears stated age Attitude/Demeanor/Rapport: Engaged Affect (typically observed): Appropriate Orientation: : Oriented to Self, Oriented to Place, Oriented to  Time, Oriented to Situation Alcohol  / Substance Use: Not Applicable Psych Involvement: No (comment)  Admission diagnosis:  Bronchopneumonia [J18.0] Elevated troponin [R79.89] COPD exacerbation (HCC) [J44.1] CAP (community acquired pneumonia) [J18.9] Elevated brain natriuretic peptide (BNP) level [R79.89] Acute respiratory failure with hypoxia (HCC) [J96.01] Patient Active Problem List   Diagnosis Date Noted   CAP (community acquired pneumonia) 03/19/2024   Blood blister 03/14/2024   Ear fullness, bilateral 12/05/2023   Pressure sore on heel 06/15/2023   Lump of skin 06/06/2023   Head injury 04/24/2023   Fall with injury 03/13/2023   Imbalance 03/13/2023   Venous ulcer of leg (HCC) 01/24/2023   Fatigue 10/05/2022   Positive ANA (antinuclear antibody) 05/30/2022   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   Lacunar infarction [facility])    Aortic atherosclerosis 03/03/2022   Peripheral neuropathy  01/03/2022   Hematuria 06/17/2021   Pulmonary artery hypertension 06/17/2021   Pacemaker 03/02/2021   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   Ascending aortic aneurysm (HCC) 03/02/2021    Lymphangioma 01/07/2021   Persistent atrial fibrillation 12/09/2020   Carotid stenosis 12/08/2020   Left hip pain 07/29/2020   Right leg swelling 06/11/2020   History of vertebral fracture 11/26/2019   Acute midline low back pain without sciatica 11/26/2019   Thrombocytopenia 11/04/2019   Anticoagulant long-term use 06/14/2019   Atrial flutter (HCC) 05/15/2019   DISH (diffuse idiopathic skeletal hyperostosis) 05/14/2019   Pulmonary infiltrates 04/23/2019   Other spondylosis with radiculopathy, lumbar region 07/17/2018   Syncope 05/28/2018   Arthropathy of spinal facet joint 03/08/2018   Malaise and fatigue 01/22/2018   Pain of right sacroiliac joint 08/09/2017   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 12/31/2016   Vitamin D  deficiency 10/23/2016   Bilateral hip pain 09/08/2016   History of colonic polyps    Pedal edema 02/25/2016   Skin rash 12/22/2015   Advanced care planning/counseling discussion 10/28/2015   CKD (chronic kidney disease) stage 2, GFR 60-89 ml/min 07/03/2015   Dyspnea on exertion 06/05/2015   Mixed stress and urge urinary incontinence 03/16/2015   Vitamin B12 deficiency 02/28/2015   Chronic insomnia 02/25/2015   Osteoporosis 12/09/2014   Medicare annual wellness visit, subsequent 10/15/2014   Health maintenance examination 10/15/2014   Adjustment disorder with mixed anxiety and depressed mood 10/15/2014   Chest pain 08/25/2014   Mitral valve replaced 12/2013   Nausea 11/26/2013   Chronic heart failure with preserved ejection fraction (HFpEF) (HCC) 10/10/2013   Pulmonary nodules 02/23/2012   History of radiation therapy    Macrocytic anemia 09/21/2011   History of rectal cancer 05/26/2011   Essential hypertension, benign 03/24/2009   Overweight (BMI 25.0-29.9) 03/05/2008   Chronic lower back pain 03/05/2008   Meralgia paresthetica 12/13/2007   GERD (gastroesophageal reflux disease) 08/06/2007   HLD (hyperlipidemia) 05/03/2007   COPD (chronic obstructive  pulmonary disease) 05/03/2007   PCP:  Claire Crick, MD Pharmacy:   [id number] [place], [place] - [address] AT [facility] [address] [facility] [address] [place] [address] Phone: [phone] Fax: [phone]  [facility] [place], [place] - [address] [place] [address] Phone: [phone] Fax: [phone]     Social Drivers of Health (SDOH) Social History: SDOH Screenings   Food Insecurity: No Food Insecurity (03/19/2024)  Housing: Low Risk  (03/19/2024)  Transportation Needs: No Transportation Needs (03/19/2024)  Utilities: Not At Risk (03/19/2024)  Alcohol  Screen: Low Risk  (11/03/2023)  Depression (PHQ2-9): Medium Risk (03/14/2024)  Financial Resource Strain: Low Risk  (11/03/2023)  Physical Activity: Unknown (11/03/2023)  Social Connections: Moderately Integrated (03/19/2024)  Stress: No Stress Concern Present (11/03/2023)  Tobacco Use: Medium Risk (03/18/2024)   SDOH Interventions: Food Insecurity Interventions: Intervention Not Indicated Housing Interventions: Intervention Not Indicated Transportation Interventions: Intervention Not Indicated Utilities Interventions: Intervention Not Indicated Social Connections Interventions: Intervention Not Indicated   Readmission Risk Interventions     No data to display

## 2024-03-20 ENCOUNTER — Encounter: Payer: Self-pay | Admitting: Cardiovascular Disease

## 2024-03-20 DIAGNOSIS — J180 Bronchopneumonia, unspecified organism: Secondary | ICD-10-CM

## 2024-03-20 DIAGNOSIS — R7989 Other specified abnormal findings of blood chemistry: Secondary | ICD-10-CM

## 2024-03-20 DIAGNOSIS — J441 Chronic obstructive pulmonary disease with (acute) exacerbation: Secondary | ICD-10-CM | POA: Diagnosis not present

## 2024-03-20 DIAGNOSIS — J9601 Acute respiratory failure with hypoxia: Secondary | ICD-10-CM

## 2024-03-20 LAB — BASIC METABOLIC PANEL WITH GFR
Anion gap: 7 (ref 5–15)
BUN: 27 mg/dL — ABNORMAL HIGH (ref 8–23)
CO2: 30 mmol/L (ref 22–32)
Calcium: 9.1 mg/dL (ref 8.9–10.3)
Chloride: 101 mmol/L (ref 98–111)
Creatinine, Ser: 0.32 mg/dL — ABNORMAL LOW (ref 0.44–1.00)
GFR, Estimated: >60 mL/min (ref >60)
Glucose, Bld: 133 mg/dL — ABNORMAL HIGH (ref 70–99)
Potassium: 4 mmol/L (ref 3.5–5.1)
Sodium: 138 mmol/L (ref 135–145)

## 2024-03-20 LAB — CBC
HCT: 38.1 % (ref 36.0–46.0)
Hemoglobin: 12 g/dL (ref 12.0–15.0)
MCH: 33.6 pg (ref 26.0–34.0)
MCHC: 31.5 g/dL (ref 30.0–36.0)
MCV: 106.7 fL — ABNORMAL HIGH (ref 80.0–100.0)
Platelets: 72 10*3/uL — ABNORMAL LOW (ref 150–400)
RBC: 3.57 MIL/uL — ABNORMAL LOW (ref 3.87–5.11)
RDW: 14 % (ref 11.5–15.5)
WBC: 6.7 10*3/uL (ref 4.0–10.5)
nRBC: 0 % (ref 0.0–0.2)

## 2024-03-20 MED ORDER — MIRABEGRON ER 25 MG PO TB24
25.0000 mg | ORAL_TABLET | Freq: Every day | ORAL | Status: DC
  Administered 2024-03-20 – 2024-03-22 (×3): 25 mg via ORAL
  Filled 2024-03-20 (×3): qty 1

## 2024-03-20 MED ORDER — AZITHROMYCIN 250 MG PO TABS
500.0000 mg | ORAL_TABLET | Freq: Every day | ORAL | Status: DC
  Administered 2024-03-21 – 2024-03-22 (×2): 500 mg via ORAL
  Filled 2024-03-20 (×2): qty 2

## 2024-03-20 MED ORDER — METHYLPREDNISOLONE SODIUM SUCC 40 MG IJ SOLR
40.0000 mg | INTRAMUSCULAR | Status: DC
  Administered 2024-03-21 – 2024-03-22 (×2): 40 mg via INTRAVENOUS
  Filled 2024-03-20 (×2): qty 1

## 2024-03-20 NOTE — Progress Notes (Signed)
 SATURATION QUALIFICATIONS: (This note is used to comply with regulatory documentation for home oxygen)  Patient Saturations on Room Air at Rest = 94%  Patient Saturations on Room Air while Ambulating = 97%  Patient Saturations on 0 Liters of oxygen while Ambulating = 97-98%  Please briefly explain why patient needs home oxygen: Pt does not need O2. O2 saturation while ambulating on RA was 97%.

## 2024-03-20 NOTE — Evaluation (Signed)
 Physical Therapy Evaluation Patient Details Name: [name] MRN: [id number] DOB: 1946-02-16 Today's Date: 03/20/2024  History of Present Illness  78 yo female presents to therapy follow hospital admission on 03/18/2024 due to progressive SOB and non productive cough. Pt dx with CAP. Pt has PMH including but not limited to; AV block, anxiety, A-fib, dCHF, COPD, LBP, aortic aneurysm, rectal ca, HLD, and lumbar fusion.  Clinical Impression     Pt admitted with above diagnosis.  Pt currently with functional limitations due to the deficits listed below (see PT Problem List). Pt seated in recliner and no reports of pain, SOB at rest on RA. Pt O2 saturation 95-100% at rest. Pt agreeable to therapy. Pt is mod I with transfer tasks recliner <> bathroom no AD, pt required S, no AD for gait tasks with lateral sway, wide BOS, R toe out and intermittent use of handrail in hallway for safety with trunk and cervical flexion 80 feet on RA and O2 saturation 94%. Pt will benefit from continued therapy in home setting with pt reporting anticipates to resume [facility] services. Pt will benefit from acute skilled PT to increase their independence and safety with mobility to allow discharge.         If plan is discharge home, recommend the following: A little help with walking and/or transfers;A little help with bathing/dressing/bathroom;Assistance with cooking/housework;Assist for transportation;Help with stairs or ramp for entrance   Can travel by private vehicle        Equipment Recommendations None recommended by PT  Recommendations for Other Services       Functional Status Assessment Patient has had a recent decline in their functional status and demonstrates the ability to make significant improvements in function in a reasonable and predictable amount of time.     Precautions / Restrictions Precautions Precautions: Fall (monitor O2) Restrictions Weight Bearing Restrictions Per Provider Order: No       Mobility  Bed Mobility               General bed mobility comments: pt seated in recliner when PT arrived    Transfers Overall transfer level: Modified independent Equipment used: None               General transfer comment: pt reports SOB with self transfer tasks recliner <> bathroom and reported SOB on RA and pt O2 saturation 92%    Ambulation/Gait Ambulation/Gait assistance: Supervision Gait Distance (Feet): 100 Feet Assistive device: None Gait Pattern/deviations: Step-to pattern, Trunk flexed Gait velocity: decreased     General Gait Details: wide BOS, lateral sway noted R toe out and excessive trunk and cervical flexion, pt intermittently using handrail in hallway for stabilization no overt LOB however fall hx  Stairs            Wheelchair Mobility     Tilt Bed    Modified Rankin (Stroke Patients Only)       Balance Overall balance assessment: History of Falls, Mild deficits observed, not formally tested                                           Pertinent Vitals/Pain Pain Assessment Pain Assessment: No/denies pain (reports hx of back pain no pain at time of eval)    Home Living Family/patient expects to be discharged to:: Private residence Living Arrangements: Alone Available Help at Discharge: Family;Friend(s) Type of Home:  House Home Access: Stairs to enter Entrance Stairs-Rails: Right Entrance Stairs-Number of Steps: 3   Home Layout: One level Home Equipment: Architectural technologist (4 wheels) Additional Comments: pt has assist with cleaning 1 x month    Prior Function Prior Level of Function : Independent/Modified Independent;Driving             Mobility Comments: pt reports using a cane to amb longer community distances, has groceries delivered and attends curch and weekly hair appointment, pt is mod I for all ADLs, self care tasks       Extremity/Trunk Assessment        Lower Extremity  Assessment Lower Extremity Assessment: Generalized weakness    Cervical / Trunk Assessment Cervical / Trunk Assessment: Kyphotic  Communication   Communication Communication: No apparent difficulties    Cognition Arousal: Alert Behavior During Therapy: WFL for tasks assessed/performed   PT - Cognitive impairments: No apparent impairments                         Following commands: Intact       Cueing       General Comments      Exercises     Assessment/Plan    PT Assessment Patient needs continued PT services  PT Problem List Decreased activity tolerance;Decreased balance;Decreased mobility;Cardiopulmonary status limiting activity       PT Treatment Interventions DME instruction;Gait training;Stair training;Functional mobility training;Therapeutic activities;Therapeutic exercise;Balance training;Neuromuscular re-education;Patient/family education    PT Goals (Current goals can be found in the Care Plan section)  Acute Rehab PT Goals Patient Stated Goal: to be able to get back to my cat PT Goal Formulation: With patient Time For Goal Achievement: 04/03/24 Potential to Achieve Goals: Good    Frequency Min 3X/week     Co-evaluation               AM-PAC PT 6 Clicks Mobility  Outcome Measure Help needed turning from your back to your side while in a flat bed without using bedrails?: None Help needed moving from lying on your back to sitting on the side of a flat bed without using bedrails?: None Help needed moving to and from a bed to a chair (including a wheelchair)?: None Help needed standing up from a chair using your arms (e.g., wheelchair or bedside chair)?: A Little Help needed to walk in hospital room?: A Little Help needed climbing 3-5 steps with a railing? : A Lot 6 Click Score: 20    End of Session Equipment Utilized During Treatment: Gait belt Activity Tolerance: Patient tolerated treatment well Patient left: in chair;with call  bell/phone within reach Nurse Communication: Mobility status;Other (comment) (O2 findings) PT Visit Diagnosis: Unsteadiness on feet (R26.81);History of falling (Z91.81);Other abnormalities of gait and mobility (R26.89)    Time: 2956-2130 PT Time Calculation (min) (ACUTE ONLY): 27 min   Charges:   PT Evaluation $PT Eval Low Complexity: 1 Low PT Treatments $Gait Training: 8-22 mins PT General Charges $$ ACUTE PT VISIT: 1 Visit         Cary Clarks, PT Acute Rehab   Annalee Kiang 03/20/2024, 1:49 PM

## 2024-03-20 NOTE — Progress Notes (Signed)
 PROGRESS NOTE    [name]  ZOX:[id number] DOB: 1946-03-03 DOA: 03/18/2024 PCP: Claire Crick, MD   Brief Narrative:  78 y.o. female with medical history significant for secondary AV block, anxiety, atrial fibrillation on Eliquis , chronic diastolic heart failure and COPD on room air at baseline being admitted to the hospital with acute hypoxic respiratory failure due to acute exacerbation of diastolic heart failure and community-acquired pneumonia.  Patient states that she saw her pulmonologist Dr. Bertrum Brodie last week and was doing well overall.  Since that time she has developed nonproductive cough, and mild dyspnea.  Yesterday PT came to her house to work with her, and noticed that she seemed short of breath and was wheezing on exam.  Her pulmonology office was contacted, they recommended she come to the ER for evaluation.  As described below, there is evidence of possible CHF exacerbation and bilateral basilar community-acquired pneumonia.  Patient was given diuretic, started on empiric IV antibiotics.  She is requiring 4 L nasal cannula oxygen, so hospitalist admission was requested.   Ct chest- Negative for acute pulmonary embolism. Patchy ground-glass opacities in the bilateral lower lungs with more confluent subpleural and peribronchovascular consolidative opacities in the lower lobes and lingula. Findings are concerning for bronchopneumonia and/or aspiration. A component pulmonary edema may be present as well.Small left greater than right pleural effusions. Dilated main pulmonary artery measuring 39 mm, can be seen with pulmonary hypertension.42 mm ascending aortic aneurysm  Assessment & Plan:   Active Problems:   CAP (community acquired pneumonia)  Acute hypoxic respiratory failure due to community-acquired pneumonia, mild COPD exacerbation, as well as evidence of mild pulmonary edema with elevated BNP bilateral pleural effusions though small.  Treatments as below.      Community-acquired pneumonia-with patchy groundglass changes in the bilateral lower lungs concerning for possible bronchopneumonia.  Continue Rocephin  and azithromycin . She is not hypoxic on room air, she does have a normal white count.   History of atrial flutter-continue beta-blocker and Eliquis    Acute on chronic heart failure with preserved EF-patient does not look particularly fluid overloaded on exam, however she has elevated BNP on presentation, and CT findings with some pulmonary edema present.  Received IV Lasix  20 mg in the ER. -Continue home Toprol -XL, lisinopril  -IV Lasix  20 mg daily   Elevated troponin likely due to demand ischemia-mild elevation, without EKG changes.  Trend is flat.  Likely mild troponin leak due to hypoxia.  No further workup planned.   COPD-with hypoxia as above and diffuse rhonchi.  There may be a component of acute COPD exacerbation.  Continue home inhalers Solu-Medrol  taper as tolerated encourage incentive spirometer.  Ambulatory oxygen saturation today.  Estimated body mass index is 24.6 kg/m as calculated from the following:   Height as of this encounter: 5' 4 (1.626 m).   Weight as of this encounter: 65 kg.  DVT prophylaxis: Eliquis   Code Status: Full code Family Communication: None Disposition Plan:  Status is: Inpatient Remains inpatient appropriate because: Acute illness   Consultants:  None  Procedures: None Antimicrobials: Rocephin  azithromycin  Subjective: She is sitting up in chair coughing still dyspneic on exertion and wheezing  Objective: Vitals:   03/19/24 2035 03/20/24 0217 03/20/24 0529 03/20/24 0953  BP: 138/66 116/65 131/70 (!) 140/79  Pulse: 83 78 75 77  Resp: 16 15 16 18   Temp: 97.7 F (36.5 C) 98.4 F (36.9 C) 97.6 F (36.4 C) 97.6 F (36.4 C)  TempSrc: Oral  Oral Oral  SpO2: 94% 94%  97% 96%  Weight:      Height:        Intake/Output Summary (Last 24 hours) at 03/20/2024 1121 Last data filed at 03/20/2024  1610 Gross per 24 hour  Intake 690 ml  Output 0 ml  Net 690 ml   Filed Weights   03/19/24 0358  Weight: 65 kg    Examination:  General exam: Appears in no acute distress Respiratory system: Rhonchi and wheeze  to auscultation. Respiratory effort normal. Cardiovascular system: Irregular Gastrointestinal system: Abdomen is nondistended, soft and nontender. No organomegaly or masses felt. Normal bowel sounds heard. Central nervous system: Alert and oriented. No focal neurological deficits. Extremities: Trace edema in both lower extremities  Data Reviewed: I have personally reviewed following labs and imaging studies  CBC: Recent Labs  Lab 03/18/24 1615 03/20/24 0443  WBC 6.8 6.7  HGB 13.1 12.0  HCT 42.3 38.1  MCV 108.7* 106.7*  PLT 81* 72*   Basic Metabolic Panel: Recent Labs  Lab 03/18/24 1615 03/20/24 0443  NA 138 138  K 3.8 4.0  CL 100 101  CO2 29 30  GLUCOSE 93 133*  BUN 17 27*  CREATININE 0.80 0.32*  CALCIUM  8.9 9.1   GFR: Estimated Creatinine Clearance: 50 mL/min (A) (by C-G formula based on SCr of 0.32 mg/dL (L)). Liver Function Tests: No results for input(s): AST, ALT, ALKPHOS, BILITOT, PROT, ALBUMIN  in the last 168 hours. No results for input(s): LIPASE, AMYLASE in the last 168 hours. No results for input(s): AMMONIA in the last 168 hours. Coagulation Profile: No results for input(s): INR, PROTIME in the last 168 hours. Cardiac Enzymes: No results for input(s): CKTOTAL, CKMB, CKMBINDEX, TROPONINI in the last 168 hours. BNP (last 3 results) Recent Labs    02/05/24 1519  PROBNP 754.0*   HbA1C: No results for input(s): HGBA1C in the last 72 hours. CBG: No results for input(s): GLUCAP in the last 168 hours. Lipid Profile: No results for input(s): CHOL, HDL, LDLCALC, TRIG, CHOLHDL, LDLDIRECT in the last 72 hours. Thyroid  Function Tests: No results for input(s): TSH, T4TOTAL, FREET4, T3FREE,  THYROIDAB in the last 72 hours. Anemia Panel: No results for input(s): VITAMINB12, FOLATE, FERRITIN, TIBC, IRON, RETICCTPCT in the last 72 hours. Sepsis Labs: No results for input(s): PROCALCITON, LATICACIDVEN in the last 168 hours.  No results found for this or any previous visit (from the past 240 hours).       Radiology Studies: CT Angio Chest PE W and/or Wo Contrast Result Date: 03/19/2024 CLINICAL DATA:  High probability of pulmonary embolism. Symptoms of shortness of breath cough. Evaluation for pneumonia chronic lung disease. History of COPD and CHF. Worsening shortness of breath lately. EXAM: CT ANGIOGRAPHY CHEST WITH CONTRAST TECHNIQUE: Multidetector CT imaging of the chest was performed using the standard protocol during bolus administration of intravenous contrast. Multiplanar CT image reconstructions and MIPs were obtained to evaluate the vascular anatomy. RADIATION DOSE REDUCTION: This exam was performed according to the departmental dose-optimization program which includes automated exposure control, adjustment of the mA and/or kV according to patient size and/or use of iterative reconstruction technique. CONTRAST:  75mL OMNIPAQUE  IOHEXOL  350 MG/ML SOLN COMPARISON:  Chest radiograph 03/18/2024 and CT 05/29/2023 FINDINGS: Cardiovascular: Dilated main pulmonary artery measuring 39 mm. Negative for acute pulmonary embolism. Cardiomegaly. No pericardial effusion. Coronary artery and aortic atherosclerotic calcification. Aortic valve replacement. 42 mm ascending aortic aneurysm. 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 Mediastinum/Nodes: Trachea and esophagus are unremarkable. No thoracic adenopathy.  Lungs/Pleura: Right apical pleural-parenchymal scarring. Patchy ground-glass opacities in the bilateral lower lungs. Or confluent subpleural and peribronchovascular consolidative opacities in the lower lobes and lingula. Small left  greater than right pleural effusions. No pneumothorax. Diffuse bronchial wall thickening and mucous plugging in the lower lobes. Scattered centrilobular micro nodules. Upper Abdomen: No acute abnormality. Musculoskeletal: Sternotomy.  No acute fracture. Review of the MIP images confirms the above findings. IMPRESSION: 1. Negative for acute pulmonary embolism. 2. Patchy ground-glass opacities in the bilateral lower lungs with more confluent subpleural and peribronchovascular consolidative opacities in the lower lobes and lingula. Findings are concerning for bronchopneumonia and/or aspiration. A component pulmonary edema may be present as well. 3. Small left greater than right pleural effusions. 4. Dilated main pulmonary artery measuring 39 mm, can be seen with pulmonary hypertension. 5. 42 mm ascending aortic aneurysm. Recommend annual imaging followup by CTA or MRA. This recommendation follows 2010 ACCF/AHA/AATS/ACR/ASA/SCA/SCAI/SIR/STS/SVM Guidelines for the Diagnosis and Management of Patients with Thoracic Aortic Disease. Circulation. 2010; 121: J191-Y782. Aortic aneurysm NOS (ICD10-I71.9) 6. Aortic Atherosclerosis (ICD10-I70.0). Electronically Signed   By: Rozell Cornet [name]   On: 03/19/2024 01:11   DG Chest 2 View Result Date: 03/18/2024 CLINICAL DATA:  sob and cough EXAM: CHEST - 2 VIEW COMPARISON:  September 04, 2023 FINDINGS: Left chest pacemaker with leads terminating in the right atrium right ventricle. Sternotomy wires. Biapical pleural thickening. Patchy airspace opacities in the right suprahilar region and both lung bases on the frontal radiograph. Small bilateral pleural effusions. No pneumothorax. Aortic and mitral valve replacement. Tortuous aorta with aortic atherosclerosis. No acute fracture or destructive lesions. Multilevel degenerative disc disease of the spine. IMPRESSION: 1. Patchy airspace opacities in the right suprahilar and both lung bases on the frontal radiograph. While this may  represent changes from underlying chronic infection, a superimposed bronchopneumonia would be difficult to exclude. 2. Unchanged small bilateral pleural effusions. Electronically Signed   By: Rance Burrows [name]   On: 03/18/2024 17:43    Scheduled Meds:  acidophilus  1 capsule Oral Daily   apixaban   5 mg Oral BID   budesonide -glycopyrrolate -formoterol   2 puff Inhalation BID   buPROPion   150 mg Oral Daily   docusate sodium   200 mg Oral Daily   Ensifentrine   1 Inhalation Inhalation BID   ferrous sulfate   325 mg Oral Q breakfast   furosemide   20 mg Intravenous Daily   gabapentin   300 mg Oral QHS   lisinopril   5 mg Oral Daily   methylPREDNISolone  (SOLU-MEDROL ) injection  40 mg Intravenous Q12H   metoprolol  succinate  25 mg Oral QPM   mirabegron  ER  25 mg Oral Daily   montelukast   10 mg Oral QHS   pravastatin   40 mg Oral q1800   traZODone   50 mg Oral QHS   Continuous Infusions:  azithromycin  500 mg (03/20/24 0616)   cefTRIAXone  (ROCEPHIN )  IV 1 g (03/20/24 0535)     LOS: 1 day    Time spent: 50 min  Barbee Lew, MD  03/20/2024, 11:21 AM

## 2024-03-20 NOTE — Plan of Care (Signed)

## 2024-03-21 ENCOUNTER — Inpatient Hospital Stay (HOSPITAL_COMMUNITY)

## 2024-03-21 DIAGNOSIS — J441 Chronic obstructive pulmonary disease with (acute) exacerbation: Secondary | ICD-10-CM | POA: Diagnosis not present

## 2024-03-21 DIAGNOSIS — R7989 Other specified abnormal findings of blood chemistry: Secondary | ICD-10-CM | POA: Diagnosis not present

## 2024-03-21 DIAGNOSIS — J9601 Acute respiratory failure with hypoxia: Secondary | ICD-10-CM | POA: Diagnosis not present

## 2024-03-21 DIAGNOSIS — J180 Bronchopneumonia, unspecified organism: Secondary | ICD-10-CM | POA: Diagnosis not present

## 2024-03-21 MED ORDER — FUROSEMIDE 10 MG/ML IJ SOLN
20.0000 mg | Freq: Once | INTRAMUSCULAR | Status: AC
  Administered 2024-03-21: 20 mg via INTRAVENOUS
  Filled 2024-03-21: qty 2

## 2024-03-21 MED ORDER — GUAIFENESIN-DM 100-10 MG/5ML PO SYRP
5.0000 mL | ORAL_SOLUTION | ORAL | Status: DC | PRN
  Administered 2024-03-21 – 2024-03-22 (×4): 5 mL via ORAL
  Filled 2024-03-21 (×4): qty 5

## 2024-03-21 NOTE — Plan of Care (Cosign Needed)
 SATURATION QUALIFICATIONS: (This note is used to comply with regulatory documentation for home oxygen)  Patient Saturations on Room Air at Rest = 91%  Patient Saturations on Room Air while Ambulating = 82%  Patient Saturations on 2 Liters of oxygen while Ambulating = 94%  Please briefly explain why patient needs home oxygen:  Pt oxygen saturation desaturates to 80s while walking without oxygen.  2L would be needed to supplement pt to at least 92% oxygen saturation while walking.

## 2024-03-21 NOTE — Progress Notes (Signed)
 Physical Therapy Treatment Patient Details Name: [name] MRN: [id number] DOB: [birth year] Today's Date: 03/21/2024   History of Present Illness 78 yo female presents to therapy follow hospital admission on 03/18/2024 due to progressive SOB and non productive cough. Pt dx with CAP. Pt has PMH including but not limited to; AV block, anxiety, A-fib, dCHF, COPD, LBP, aortic aneurysm, rectal ca, HLD, and lumbar fusion.    PT Comments  Pt in recliner when PT arrives, states that she has had increased cough and decreased O2 sats over the last 24 hours. She notes that she has a 4WW but needs it for community mobility, difficulty getting it into and out of her vehicle. Pt able to complete amb x173ft and completes 3 stairs with bil HR use consistent with home setup. O2 sats monitored with a drop in O2 with activity to 87%, able to recover well with pursed lip breathing on RA. Has conversation while sitting and O2 remains mid 90s on RA.     If plan is discharge home, recommend the following: A little help with walking and/or transfers;A little help with bathing/dressing/bathroom;Assistance with cooking/housework;Assist for transportation;Help with stairs or ramp for entrance   Can travel by private vehicle        Equipment Recommendations  None recommended by PT    Recommendations for Other Services       Precautions / Restrictions Precautions Precautions: Fall;Other (comment) (monitor O2) Restrictions Weight Bearing Restrictions Per Provider Order: No     Mobility  Bed Mobility               General bed mobility comments: pt seated in recliner when PT arrived    Transfers Overall transfer level: Modified independent Equipment used: None               General transfer comment: pt reports SOB with self transfer tasks recliner <> bathroom    Ambulation/Gait Ambulation/Gait assistance: Supervision Gait Distance (Feet): 100 Feet Assistive device: None Gait  Pattern/deviations: Trunk flexed, Step-through pattern, Decreased stride length, Knee flexed in stance - right, Knee flexed in stance - left, Wide base of support Gait velocity: decreased     General Gait Details: pt has forward flexed trunk, uses hand rails in hallway when available. Is familiar with [id number] and has one but looking for one less heavy to assist with getting it into/out of the car. Pt has shortened stride, no LOB. educated to use 2WW when going to and from bathroom as she states intermittent instability   Stairs Stairs: Yes Stairs assistance: Supervision Stair Management: Two rails, Step to pattern Number of Stairs: 3 General stair comments: safe completion, good safety awareness and use of step to technique   Wheelchair Mobility     Tilt Bed    Modified Rankin (Stroke Patients Only)       Balance Overall balance assessment: History of Falls, Needs assistance Sitting-balance support: Feet supported, No upper extremity supported Sitting balance-Leahy Scale: Good     Standing balance support: No upper extremity supported, During functional activity Standing balance-Leahy Scale: Good                              Communication Communication Communication: Impaired Factors Affecting Communication: Hearing impaired  Cognition Arousal: Alert Behavior During Therapy: WFL for tasks assessed/performed   PT - Cognitive impairments: No apparent impairments  Following commands: Intact      Cueing    Exercises      General Comments        Pertinent Vitals/Pain Pain Assessment Pain Assessment: Faces Faces Pain Scale: Hurts even more Pain Location: low back Pain Descriptors / Indicators: Aching, Cramping, Discomfort, Squeezing, Sore Pain Intervention(s): Limited activity within patient's tolerance, Monitored during session    Home Living                          Prior Function            PT  Goals (current goals can now be found in the care plan section) Acute Rehab PT Goals Patient Stated Goal: to be able to get back to my cat PT Goal Formulation: With patient Time For Goal Achievement: 04/03/24 Potential to Achieve Goals: Good Progress towards PT goals: Progressing toward goals    Frequency    Min 3X/week      PT Plan      Co-evaluation              AM-PAC PT 6 Clicks Mobility   Outcome Measure  Help needed turning from your back to your side while in a flat bed without using bedrails?: None Help needed moving from lying on your back to sitting on the side of a flat bed without using bedrails?: None Help needed moving to and from a bed to a chair (including a wheelchair)?: None Help needed standing up from a chair using your arms (e.g., wheelchair or bedside chair)?: None Help needed to walk in hospital room?: A Little Help needed climbing 3-5 steps with a railing? : A Little 6 Click Score: 22    End of Session Equipment Utilized During Treatment: Gait belt Activity Tolerance: Patient tolerated treatment well Patient left: in chair;with call bell/phone within reach Nurse Communication: Mobility status;Other (comment) (O2 sats) PT Visit Diagnosis: Unsteadiness on feet (R26.81);History of falling (Z91.81);Other abnormalities of gait and mobility (R26.89)     Time: 5409-8119 PT Time Calculation (min) (ACUTE ONLY): 29 min  Charges:    $Gait Training: 23-37 mins PT General Charges $$ ACUTE PT VISIT: 1 Visit                     Darien Eden, PT Acute Rehabilitation Services Office: [phone] 03/21/2024    [name] 03/21/2024, 12:54 PM

## 2024-03-21 NOTE — Progress Notes (Signed)
 PROGRESS NOTE    [name]  WUJ:[id number] DOB: [birth year] DOA: 03/18/2024 PCP: Claire Crick, MD   Brief Narrative:  78 y.o. female with medical history significant for secondary AV block, anxiety, atrial fibrillation on Eliquis , chronic diastolic heart failure and COPD on room air at baseline being admitted to the hospital with acute hypoxic respiratory failure due to acute exacerbation of diastolic heart failure and community-acquired pneumonia.  Patient states that she saw her pulmonologist Dr. Bertrum Brodie last week and was doing well overall.  Since that time she has developed nonproductive cough, and mild dyspnea.  Yesterday PT came to her house to work with her, and noticed that she seemed short of breath and was wheezing on exam.  Her pulmonology office was contacted, they recommended she come to the ER for evaluation.  As described below, there is evidence of possible CHF exacerbation and bilateral basilar community-acquired pneumonia.  Patient was given diuretic, started on empiric IV antibiotics.  She is requiring 4 L nasal cannula oxygen, so hospitalist admission was requested.   Ct chest- Negative for acute pulmonary embolism. Patchy ground-glass opacities in the bilateral lower lungs with more confluent subpleural and peribronchovascular consolidative opacities in the lower lobes and lingula. Findings are concerning for bronchopneumonia and/or aspiration. A component pulmonary edema may be present as well.Small left greater than right pleural effusions. Dilated main pulmonary artery measuring 39 mm, can be seen with pulmonary hypertension.42 mm ascending aortic aneurysm  Assessment & Plan:   Active Problems:   Acute respiratory failure with hypoxia (HCC)   COPD exacerbation (HCC)   CAP (community acquired pneumonia)   Bronchopneumonia   Elevated brain natriuretic peptide (BNP) level  Acute hypoxic respiratory failure due to community-acquired pneumonia, mild COPD  exacerbation, as well as evidence of mild pulmonary edema with elevated BNP bilateral pleural effusions though small.  Treatments as below.   Oxygen saturation dropped to 82% with ambulation today 03/21/2024 PA lateral chest x-ray 6/12-Increased small bilateral pleural effusions with bibasilar patchy opacities, which may represent atelectasis, aspiration, or pneumonia.   Community-acquired pneumonia-with patchy groundglass changes in the bilateral lower lungs concerning for possible bronchopneumonia.  Continue Rocephin  and azithromycin .   History of atrial flutter-continue beta-blocker and Eliquis    Acute on chronic heart failure with preserved EF-patient does not look particularly fluid overloaded on exam, however she has elevated BNP on presentation, and CT findings with some pulmonary edema present.  Received IV Lasix  20 mg in the ER. -Continue home Toprol -XL, lisinopril  -IV Lasix  20 mg daily   Elevated troponin likely due to demand ischemia-mild elevation, without EKG changes.  Trend is flat.  Likely mild troponin leak due to hypoxia.  No further workup planned.   COPD-with hypoxia as above and diffuse rhonchi.  There may be a component of acute COPD exacerbation.  Continue home inhalers Solu-Medrol  taper as tolerated encourage incentive spirometer.  Ambulatory oxygen saturation today.  Estimated body mass index is 24.6 kg/m as calculated from the following:   Height as of this encounter: 5' 4 (1.626 m).   Weight as of this encounter: 65 kg.  DVT prophylaxis: Eliquis   Code Status: Full code Family Communication: None Disposition Plan:  Status is: Inpatient Remains inpatient appropriate because: Acute illness   Consultants:  None  Procedures: None Antimicrobials: Rocephin  azithromycin  Subjective: Up in chair Sleeps in chair at home Had to be put on oxygen overnight as her sats dropped Ambulation today sats dropped to 82%  Objective: Vitals:   03/20/24 0953 03/20/24 1713  03/20/24 2130 03/21/24 0527  BP: (!) 140/79 128/69 131/70 124/66  Pulse: 77 76 79 77  Resp: 18 18 20 18   Temp: 97.6 F (36.4 C) 98.1 F (36.7 C) 98.7 F (37.1 C) 98.1 F (36.7 C)  TempSrc: Oral Oral  Oral  SpO2: 96% 95% 95% 95%  Weight:      Height:        Intake/Output Summary (Last 24 hours) at 03/21/2024 1055 Last data filed at 03/21/2024 0900 Gross per 24 hour  Intake 960 ml  Output 150 ml  Net 810 ml   Filed Weights   03/19/24 0358  Weight: 65 kg    Examination:  General exam: Appears in no acute distress Respiratory system: Rhonchi and wheeze  to auscultation. Respiratory effort normal. Cardiovascular system: Irregular Gastrointestinal system: Abdomen is nondistended, soft and nontender. No organomegaly or masses felt. Normal bowel sounds heard. Central nervous system: Alert and oriented. No focal neurological deficits. Extremities: Trace edema in both lower extremities  Data Reviewed: I have personally reviewed following labs and imaging studies  CBC: Recent Labs  Lab 03/18/24 1615 03/20/24 0443  WBC 6.8 6.7  HGB 13.1 12.0  HCT 42.3 38.1  MCV 108.7* 106.7*  PLT 81* 72*   Basic Metabolic Panel: Recent Labs  Lab 03/18/24 1615 03/20/24 0443  NA 138 138  K 3.8 4.0  CL 100 101  CO2 29 30  GLUCOSE 93 133*  BUN 17 27*  CREATININE 0.80 0.32*  CALCIUM  8.9 9.1   GFR: Estimated Creatinine Clearance: 50 mL/min (A) (by C-G formula based on SCr of 0.32 mg/dL (L)). Liver Function Tests: No results for input(s): AST, ALT, ALKPHOS, BILITOT, PROT, ALBUMIN  in the last 168 hours. No results for input(s): LIPASE, AMYLASE in the last 168 hours. No results for input(s): AMMONIA in the last 168 hours. Coagulation Profile: No results for input(s): INR, PROTIME in the last 168 hours. Cardiac Enzymes: No results for input(s): CKTOTAL, CKMB, CKMBINDEX, TROPONINI in the last 168 hours. BNP (last 3 results) Recent Labs    02/05/24 1519   PROBNP 754.0*   HbA1C: No results for input(s): HGBA1C in the last 72 hours. CBG: No results for input(s): GLUCAP in the last 168 hours. Lipid Profile: No results for input(s): CHOL, HDL, LDLCALC, TRIG, CHOLHDL, LDLDIRECT in the last 72 hours. Thyroid  Function Tests: No results for input(s): TSH, T4TOTAL, FREET4, T3FREE, THYROIDAB in the last 72 hours. Anemia Panel: No results for input(s): VITAMINB12, FOLATE, FERRITIN, TIBC, IRON, RETICCTPCT in the last 72 hours. Sepsis Labs: No results for input(s): PROCALCITON, LATICACIDVEN in the last 168 hours.  No results found for this or any previous visit (from the past 240 hours).       Radiology Studies: DG Chest 2 View Result Date: 03/21/2024 CLINICAL DATA:  Hypoxia and new cough EXAM: CHEST - 2 VIEW COMPARISON:  Chest radiograph dated 03/18/2024 FINDINGS: Lines/tubes: Left chest wall pacemaker leads project over the right atrium and ventricle. Lungs: Bilateral lung apices are obscured by the overlying mandible. Low lung volumes with bronchovascular crowding. Bibasilar patchy opacities. Pleura: Increased small bilateral pleural effusions. No pneumothorax. Heart/mediastinum: Similar cardiomediastinal silhouette status post aortic and mitral valve replacement. Bones: Median sternotomy wires are nondisplaced. IMPRESSION: Increased small bilateral pleural effusions with bibasilar patchy opacities, which may represent atelectasis, aspiration, or pneumonia. Electronically Signed   By: Limin  Xu [name]   On: 03/21/2024 10:42    Scheduled Meds:  acidophilus  1 capsule Oral Daily   apixaban   5  mg Oral BID   azithromycin   500 mg Oral Daily   budesonide -glycopyrrolate -formoterol   2 puff Inhalation BID   buPROPion   150 mg Oral Daily   docusate sodium   200 mg Oral Daily   Ensifentrine   1 Inhalation Inhalation BID   ferrous sulfate   325 mg Oral Q breakfast   furosemide   20 mg Intravenous Daily   gabapentin    300 mg Oral QHS   lisinopril   5 mg Oral Daily   methylPREDNISolone  (SOLU-MEDROL ) injection  40 mg Intravenous Q24H   metoprolol  succinate  25 mg Oral QPM   mirabegron  ER  25 mg Oral Daily   montelukast   10 mg Oral QHS   pravastatin   40 mg Oral q1800   traZODone   50 mg Oral QHS   Continuous Infusions:  cefTRIAXone  (ROCEPHIN )  IV 1 g (03/21/24 0532)     LOS: 2 days    Time spent: 50 min  Barbee Lew, MD  03/21/2024, 10:55 AM

## 2024-03-22 DIAGNOSIS — J441 Chronic obstructive pulmonary disease with (acute) exacerbation: Secondary | ICD-10-CM | POA: Diagnosis not present

## 2024-03-22 DIAGNOSIS — J9601 Acute respiratory failure with hypoxia: Secondary | ICD-10-CM | POA: Diagnosis not present

## 2024-03-22 MED ORDER — PREDNISONE 20 MG PO TABS: 40.0000 mg | ORAL_TABLET | Freq: Every day | ORAL | 0 refills | Status: DC

## 2024-03-22 MED ORDER — AMOXICILLIN-POT CLAVULANATE 875-125 MG PO TABS: 1.0000 | ORAL_TABLET | Freq: Two times a day (BID) | ORAL | 0 refills | Status: DC

## 2024-03-22 NOTE — Discharge Summary (Signed)
 Physician Discharge Summary  [name] WNU:[id number] DOB: 10-29-1945 DOA: 03/18/2024  PCP: Claire Crick, MD  Admit date: 03/18/2024 Discharge date: 03/22/2024  Admitted From: home Disposition:  home Recommendations for Outpatient Follow-up:  Follow up with PCP in 1-2 weeks Please obtain BMP/CBC in one week  Home Health:yes Equipment/Devices:oxygen 2 L Discharge Condition:STABLE CODE STATUS:full Diet recommendation:cardiac Brief/Interim Summary:  78 y.o. female with medical history significant for secondary AV block, anxiety, atrial fibrillation on Eliquis , chronic diastolic heart failure and COPD on room air at baseline being admitted to the hospital with acute hypoxic respiratory failure due to acute exacerbation of diastolic heart failure and community-acquired pneumonia.  Patient states that she saw her pulmonologist Dr. Bertrum Brodie last week and was doing well overall.  Since that time she has developed nonproductive cough, and mild dyspnea.  Yesterday PT came to her house to work with her, and noticed that she seemed short of breath and was wheezing on exam.  Her pulmonology office was contacted, they recommended she come to the ER for evaluation.  As described below, there is evidence of possible CHF exacerbation and bilateral basilar community-acquired pneumonia.  Patient was given diuretic, started on empiric IV antibiotics.  She is requiring 4 L nasal cannula oxygen, so hospitalist admission was requested.    Ct chest- Negative for acute pulmonary embolism. Patchy ground-glass opacities in the bilateral lower lungs with more confluent subpleural and peribronchovascular consolidative opacities in the lower lobes and lingula. Findings are concerning for bronchopneumonia and/or aspiration. A component pulmonary edema may be present as well.Small left greater than right pleural effusions. Dilated main pulmonary artery measuring 39 mm, can be seen with pulmonary  hypertension.42 mm ascending aortic aneurysm Discharge Diagnoses:  Active Problems:   Acute respiratory failure with hypoxia (HCC)   COPD exacerbation (HCC)   CAP (community acquired pneumonia)   Bronchopneumonia   Elevated brain natriuretic peptide (BNP) level  Acute hypoxic respiratory failure due to community-acquired pneumonia, mild COPD exacerbation, as well as evidence of mild pulmonary edema with elevated BNP bilateral pleural effusions though small. PA lateral chest x-ray 6/12-Increased small bilateral pleural effusions with bibasilar patchy opacities, which may represent atelectasis, aspiration, or pneumonia.   Community-acquired pneumonia-with patchy groundglass changes in the bilateral lower lungs concerning for possible bronchopneumonia.  She was discharged on Augmentin  treated with Rocephin  and azithromycin  for 4 days during the hospital stay.     History of atrial flutter-continue beta-blocker and Eliquis    Acute on chronic heart failure with preserved EF-patient does not look particularly fluid overloaded on exam, however she has elevated BNP on presentation, and CT findings with some pulmonary edema present.  Treated with Lasix  continue Toprol  and lisinopril .     Elevated troponin likely due to demand ischemia-mild elevation, without EKG changes.  Trend is flat.  Likely mild troponin leak due to hypoxia.  No further workup planned.   COPD-with hypoxia as above and diffuse rhonchi.  There may be a component of acute COPD exacerbation.  Treated with Solu-Medrol  and discharged on prednisone .  Upon ambulatory oxygen saturation dropped to 88%.  O2 was arranged for 2 L on discharge.    Estimated body mass index is 24.6 kg/m as calculated from the following:   Height as of this encounter: 5' 4 (1.626 m).   Weight as of this encounter: 65 kg.  Discharge Instructions  Discharge Instructions     Diet - low sodium heart healthy   Complete by: As directed    For home use  only  DME oxygen   Complete by: As directed    Sats 88 on RA   Length of Need: 6 Months   Mode or (Route): Nasal cannula   Liters per Minute: 2   Frequency: Continuous (stationary and portable oxygen unit needed)   Oxygen conserving device: Yes   Oxygen delivery system: Gas   Increase activity slowly   Complete by: As directed       Allergies as of 03/22/2024       Reactions   Tape Other (See Comments)   THICK TAPES PULLS OFF MY SKIN   Codeine Nausea Only   Prednisone  Nausea Only   Sulfa Antibiotics Nausea Only        Medication List     STOP taking these medications    cephALEXin  250 MG capsule Commonly known as: KEFLEX    omeprazole  20 MG capsule Commonly known as: PRILOSEC       TAKE these medications    acetaminophen  500 MG tablet Commonly known as: TYLENOL  Take 2 tablets (1,000 mg total) by mouth in the morning and at bedtime.   acidophilus Caps capsule Take 1 capsule by mouth daily.   albuterol  108 (90 Base) MCG/ACT inhaler Commonly known as: VENTOLIN  HFA Inhale 2 puffs into the lungs every 6 (six) hours as needed for wheezing.   amoxicillin -clavulanate 875-125 MG tablet Commonly known as: AUGMENTIN  Take 1 tablet by mouth 2 (two) times daily.   ascorbic acid  500 MG tablet Commonly known as: VITAMIN C Take 1 tablet (500 mg total) by mouth daily.   AZO URINARY TRACT SUPPORT PO Take 1 capsule by mouth daily.   Biotin 1000 MCG tablet Take 1,000 mcg by mouth daily.   buPROPion  150 MG 12 hr tablet Commonly known as: WELLBUTRIN  SR TAKE 1 TABLET(150 MG) BY MOUTH EVERY MORNING   calcium  carbonate 1500 (600 Ca) MG Tabs tablet Commonly known as: OSCAL Take 600 mg of elemental calcium  by mouth daily.   carboxymethylcellulose 0.5 % Soln Commonly known as: REFRESH PLUS Place 1 drop into both eyes 3 (three) times daily as needed (dry eyes).   cholecalciferol 25 MCG (1000 UNIT) tablet Commonly known as: VITAMIN D3 Take 1,000 Units by mouth daily.    docusate sodium  100 MG capsule Commonly known as: COLACE Take 200 mg by mouth daily.   Eliquis  5 MG Tabs tablet Generic drug: apixaban  TAKE 1 TABLET(5 MG) BY MOUTH TWICE DAILY What changed: See the new instructions.   ferrous sulfate  325 (65 FE) MG EC tablet Take 1 tablet (325 mg total) by mouth daily with breakfast.   furosemide  40 MG tablet Commonly known as: LASIX  TAKE 1 TABLET BY MOUTH EVERY DAY. MAY TAKE AN EXTRA TABLET AS NEEDED FOR SWELLING What changed: See the new instructions.   gabapentin  300 MG capsule Commonly known as: NEURONTIN  Take 300 mg by mouth at bedtime.   gabapentin  100 MG capsule Commonly known as: NEURONTIN  Take 1 capsule (100 mg total) by mouth 2 (two) times daily as needed (back pain - for daytime use).   Gemtesa  75 MG Tabs Generic drug: Vibegron  Take 75 mg by mouth daily.   hydrocortisone  2.5 % rectal cream Commonly known as: ANUSOL -HC APPLY RECTALLY TO THE AFFECTED AREA TWICE DAILY   lisinopril  5 MG tablet Commonly known as: ZESTRIL  Take 5 mg by mouth daily. What changed: Another medication with the same name was removed. Continue taking this medication, and follow the directions you see here.   lovastatin  40 MG tablet Commonly  known as: MEVACOR  TAKE 1 TABLET(40 MG) BY MOUTH AT BEDTIME   metoprolol  succinate 25 MG 24 hr tablet Commonly known as: Toprol  XL Take 1 tablet (25 mg total) by mouth every evening.   montelukast  10 MG tablet Commonly known as: SINGULAIR  TAKE 1 TABLET(10 MG) BY MOUTH AT BEDTIME   Ohtuvayre  3 MG/2.5ML Susp Generic drug: Ensifentrine  Inhale 1 Inhalation into the lungs in the morning and at bedtime. Twice daily via neb   ondansetron  4 MG tablet Commonly known as: Zofran  Take 1 tablet (4 mg total) by mouth every 8 (eight) hours as needed for nausea or vomiting.   polyethylene glycol 17 g packet Commonly known as: MIRALAX  / GLYCOLAX  Take 17 g by mouth daily. What changed:  when to take this reasons to take  this   potassium chloride  10 MEQ tablet Commonly known as: KLOR-CON  TAKE 2 TABLETS(20 MEQ) BY MOUTH DAILY   predniSONE  20 MG tablet Commonly known as: DELTASONE  Take 2 tablets (40 mg total) by mouth daily with breakfast.   PRESERVISION AREDS 2 PO Take 2 capsules by mouth in the morning.   promethazine  12.5 MG tablet Commonly known as: PHENERGAN  Take 1 tablet (12.5 mg total) by mouth every 8 (eight) hours as needed for nausea or vomiting.   traMADol  50 MG tablet Commonly known as: ULTRAM  Take 1-2 tablets (50-100 mg total) by mouth 2 (two) times daily as needed for moderate pain (pain score 4-6).   traZODone  50 MG tablet Commonly known as: DESYREL  TAKE 1/2 TO 1 TABLET(25 TO 50 MG) BY MOUTH AT BEDTIME What changed: See the new instructions.   Trelegy Ellipta  200-62.5-25 MCG/ACT Aepb Generic drug: Fluticasone -Umeclidin-Vilant Inhale 1 puff into the lungs daily.   vitamin B-12 500 MCG tablet Commonly known as: CYANOCOBALAMIN  Take 500 mcg by mouth 2 (two) times a week.   Zinc  50 MG Caps Take 1 capsule (50 mg total) by mouth daily.               Durable Medical Equipment  (From admission, onward)           Start     Ordered   03/22/24 0000  For home use only DME oxygen       Comments: Sats 88 on RA  Question Answer Comment  Length of Need 6 Months   Mode or (Route) Nasal cannula   Liters per Minute 2   Frequency Continuous (stationary and portable oxygen unit needed)   Oxygen conserving device Yes   Oxygen delivery system Gas      03/22/24 1209            Follow-up Information     Claire Crick, MD Follow up.   Specialty: Family Medicine Contact information: [address] [place] [address] [phone]         Edward Graff, [facility] Follow up.   Specialty: Home Health Services Why: [facility] will continue to provide home health physical therapy at discharge Contact information: [address]  [address] [place] [address] [phone]         Care, [facility]. Go to.   Why: [employer] will be providing home oxygen Contact information: [address] [place] [address] [phone]                Allergies  Allergen Reactions   Tape Other (See Comments)    THICK TAPES PULLS OFF MY SKIN   Codeine Nausea Only   Prednisone  Nausea  Only   Sulfa Antibiotics Nausea Only    Consultations none  Procedures/Studies: DG Chest 2 View Result Date: 03/21/2024 CLINICAL DATA:  Hypoxia and new cough EXAM: CHEST - 2 VIEW COMPARISON:  Chest radiograph dated 03/18/2024 FINDINGS: Lines/tubes: Left chest wall pacemaker leads project over the right atrium and ventricle. Lungs: Bilateral lung apices are obscured by the overlying mandible. Low lung volumes with bronchovascular crowding. Bibasilar patchy opacities. Pleura: Increased small bilateral pleural effusions. No pneumothorax. Heart/mediastinum: Similar cardiomediastinal silhouette status post aortic and mitral valve replacement. Bones: Median sternotomy wires are nondisplaced. IMPRESSION: Increased small bilateral pleural effusions with bibasilar patchy opacities, which may represent atelectasis, aspiration, or pneumonia. Electronically Signed   By: Limin  Xu [name]   On: 03/21/2024 10:42   CT Angio Chest PE W and/or Wo Contrast Result Date: 03/19/2024 CLINICAL DATA:  High probability of pulmonary embolism. Symptoms of shortness of breath cough. Evaluation for pneumonia chronic lung disease. History of COPD and CHF. Worsening shortness of breath lately. EXAM: CT ANGIOGRAPHY CHEST WITH CONTRAST TECHNIQUE: Multidetector CT imaging of the chest was performed using the standard protocol during bolus administration of intravenous contrast. Multiplanar CT image reconstructions and MIPs were obtained to evaluate the vascular anatomy. RADIATION DOSE REDUCTION: This exam was performed according to the departmental dose-optimization program which  includes automated exposure control, adjustment of the mA and/or kV according to patient size and/or use of iterative reconstruction technique. CONTRAST:  75mL OMNIPAQUE  IOHEXOL  350 MG/ML SOLN COMPARISON:  Chest radiograph 03/18/2024 and CT 05/29/2023 FINDINGS: Cardiovascular: Dilated main pulmonary artery measuring 39 mm. Negative for acute pulmonary embolism. Cardiomegaly. No pericardial effusion. Coronary artery and aortic atherosclerotic calcification. Aortic valve replacement. 42 mm ascending aortic aneurysm. 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 Mediastinum/Nodes: Trachea and esophagus are unremarkable. No thoracic adenopathy. Lungs/Pleura: Right apical pleural-parenchymal scarring. Patchy ground-glass opacities in the bilateral lower lungs. Or confluent subpleural and peribronchovascular consolidative opacities in the lower lobes and lingula. Small left greater than right pleural effusions. No pneumothorax. Diffuse bronchial wall thickening and mucous plugging in the lower lobes. Scattered centrilobular micro nodules. Upper Abdomen: No acute abnormality. Musculoskeletal: Sternotomy.  No acute fracture. Review of the MIP images confirms the above findings. IMPRESSION: 1. Negative for acute pulmonary embolism. 2. Patchy ground-glass opacities in the bilateral lower lungs with more confluent subpleural and peribronchovascular consolidative opacities in the lower lobes and lingula. Findings are concerning for bronchopneumonia and/or aspiration. A component pulmonary edema may be present as well. 3. Small left greater than right pleural effusions. 4. Dilated main pulmonary artery measuring 39 mm, can be seen with pulmonary hypertension. 5. 42 mm ascending aortic aneurysm. Recommend annual imaging followup by CTA or MRA. This recommendation follows 2010 ACCF/AHA/AATS/ACR/ASA/SCA/SCAI/SIR/STS/SVM Guidelines for the Diagnosis and Management of Patients with Thoracic  Aortic Disease. Circulation. 2010; 121: Y782-N562. Aortic aneurysm NOS (ICD10-I71.9) 6. Aortic Atherosclerosis (ICD10-I70.0). Electronically Signed   By: Rozell Cornet [name]   On: 03/19/2024 01:11   DG Chest 2 View Result Date: 03/18/2024 CLINICAL DATA:  sob and cough EXAM: CHEST - 2 VIEW COMPARISON:  September 04, 2023 FINDINGS: Left chest pacemaker with leads terminating in the right atrium right ventricle. Sternotomy wires. Biapical pleural thickening. Patchy airspace opacities in the right suprahilar region and both lung bases on the frontal radiograph. Small bilateral pleural effusions. No pneumothorax. Aortic and mitral valve replacement. Tortuous aorta with aortic atherosclerosis. No acute fracture or destructive lesions. Multilevel degenerative disc disease of the spine. IMPRESSION: 1. Patchy airspace  opacities in the right suprahilar and both lung bases on the frontal radiograph. While this may represent changes from underlying chronic infection, a superimposed bronchopneumonia would be difficult to exclude. 2. Unchanged small bilateral pleural effusions. Electronically Signed   By: Rance Burrows [name]   On: 03/18/2024 17:43   (Echo, Carotid, EGD, Colonoscopy, ERCP)    Subjective:  Sitting up in chair had a good night feeling better still with some cough Discharge Exam: Vitals:   03/22/24 0851 03/22/24 1217  BP:  129/68  Pulse:  73  Resp:  18  Temp:  (!) 97.5 F (36.4 C)  SpO2: 91% 90%   Vitals:   03/22/24 0428 03/22/24 0849 03/22/24 0851 03/22/24 1217  BP: 128/74   129/68  Pulse: 72   73  Resp: 17   18  Temp: 98 F (36.7 C)   (!) 97.5 F (36.4 C)  TempSrc: Oral     SpO2: 91% 91% 91% 90%  Weight:      Height:        General: Pt is alert, awake, not in acute distress Cardiovascular: RRR, S1/S2 +, no rubs, no gallops Respiratory: Few rhonchi Abdominal: Soft, NT, ND, bowel sounds + Extremities: no edema, no cyanosis    The results of significant diagnostics from this  hospitalization (including imaging, microbiology, ancillary and laboratory) are listed below for reference.     Microbiology: No results found for this or any previous visit (from the past 240 hours).   Labs: BNP (last 3 results) Recent Labs    08/27/23 1052 03/18/24 1615  BNP 293.9* 513.9*   Basic Metabolic Panel: Recent Labs  Lab 03/18/24 1615 03/20/24 0443  NA 138 138  K 3.8 4.0  CL 100 101  CO2 29 30  GLUCOSE 93 133*  BUN 17 27*  CREATININE 0.80 0.32*  CALCIUM  8.9 9.1   Liver Function Tests: No results for input(s): AST, ALT, ALKPHOS, BILITOT, PROT, ALBUMIN  in the last 168 hours. No results for input(s): LIPASE, AMYLASE in the last 168 hours. No results for input(s): AMMONIA in the last 168 hours. CBC: Recent Labs  Lab 03/18/24 1615 03/20/24 0443  WBC 6.8 6.7  HGB 13.1 12.0  HCT 42.3 38.1  MCV 108.7* 106.7*  PLT 81* 72*   Cardiac Enzymes: No results for input(s): CKTOTAL, CKMB, CKMBINDEX, TROPONINI in the last 168 hours. BNP: Invalid input(s): POCBNP CBG: No results for input(s): GLUCAP in the last 168 hours. D-Dimer No results for input(s): DDIMER in the last 72 hours. Hgb A1c No results for input(s): HGBA1C in the last 72 hours. Lipid Profile No results for input(s): CHOL, HDL, LDLCALC, TRIG, CHOLHDL, LDLDIRECT in the last 72 hours. Thyroid  function studies No results for input(s): TSH, T4TOTAL, T3FREE, THYROIDAB in the last 72 hours.  Invalid input(s): FREET3 Anemia work up No results for input(s): VITAMINB12, FOLATE, FERRITIN, TIBC, IRON, RETICCTPCT in the last 72 hours. Urinalysis    Component Value Date/Time   COLORURINE YELLOW 06/30/2023 0832   APPEARANCEUR CLEAR 06/30/2023 0832   LABSPEC 1.010 06/30/2023 0832   PHURINE 6.5 06/30/2023 0832   GLUCOSEU NEGATIVE 06/30/2023 0832   HGBUR NEGATIVE 06/30/2023 0832   BILIRUBINUR NEGATIVE 06/30/2023 0832   BILIRUBINUR negative  06/16/2021 1012   KETONESUR NEGATIVE 06/30/2023 0832   PROTEINUR (A) 05/07/2023 0939    TEST NOT REPORTED DUE TO COLOR INTERFERENCE OF URINE PIGMENT   UROBILINOGEN 0.2 06/30/2023 0832   NITRITE NEGATIVE 06/30/2023 0832   LEUKOCYTESUR TRACE (A) 06/30/2023 1610  Sepsis Labs Recent Labs  Lab 03/18/24 1615 03/20/24 0443  WBC 6.8 6.7   Microbiology No results found for this or any previous visit (from the past 240 hours).   Time coordinating discharge: 38 minutes  SIGNED:   Barbee Lew, MD  [facility] 03/22/2024, 4:40 PM

## 2024-03-22 NOTE — Progress Notes (Signed)
 Physical Therapy Treatment Patient Details Name: [name] MRN: [id number] DOB: 28-Nov-1945 Today's Date: 03/22/2024   History of Present Illness 78 yo female presents to therapy follow hospital admission on 03/18/2024 due to progressive SOB and non productive cough. Pt dx with CAP. Pt has PMH including but not limited to; AV block, anxiety, A-fib, dCHF, COPD, LBP, aortic aneurysm, rectal ca, HLD, and lumbar fusion.    PT Comments   Pt admitted with above diagnosis.  Pt currently with functional limitations due to the deficits listed below (see PT Problem List). Pt seated in recliner when PT arrived. Pt on RA (91-95%) and indicated that last night was a little better, pt continues to have frequent unproductive cough. Pt agreeable to therapy intervention pt reported she anticipates d/c today and is concerned that she might need supplemental o2 in home setting. Pt is mod I for transfer tasks no AD, S for gait tasks with rollator trial 200 feet with cues and flexed posture, R toe out, wide BOS and slow cadence with pt SOB with prolonged gait. Once seated o2 assessed and 88% with pt requiring 54s to recover to 90% and coaching for pursed lip breathing. Pt left seated in recliner, all needs in place and 92% at rest on RA. Pt is requesting to continue [facility] services.  Pt will benefit from acute skilled PT to increase their independence and safety with mobility to allow discharge.      If plan is discharge home, recommend the following: A little help with walking and/or transfers;A little help with bathing/dressing/bathroom;Assistance with cooking/housework;Assist for transportation;Help with stairs or ramp for entrance   Can travel by private vehicle        Equipment Recommendations  None recommended by PT    Recommendations for Other Services       Precautions / Restrictions Precautions Precautions: Fall;Other (comment) (monitor O2) Restrictions Weight Bearing Restrictions Per Provider  Order: No     Mobility  Bed Mobility               General bed mobility comments: pt seated in recliner when PT arrived    Transfers Overall transfer level: Modified independent Equipment used: None                    Ambulation/Gait Ambulation/Gait assistance: Supervision Gait Distance (Feet): 200 Feet Assistive device: Rollator (4 wheels) Gait Pattern/deviations: Trunk flexed, Step-through pattern, Decreased stride length, Knee flexed in stance - right, Knee flexed in stance - left, Wide base of support Gait velocity: decreased     General Gait Details: gait assessed with rollator today, pt required S and cues. pt will benefit from reinforcement for safe use of rollator. pt c/o SOB with prolonged gait tasks today   Stairs             Wheelchair Mobility     Tilt Bed    Modified Rankin (Stroke Patients Only)       Balance Overall balance assessment: History of Falls, Needs assistance Sitting-balance support: Feet supported, No upper extremity supported Sitting balance-Leahy Scale: Good     Standing balance support: Bilateral upper extremity supported, During functional activity, Reliant on assistive device for balance Standing balance-Leahy Scale: Fair                              Musician Communication: Impaired Factors Affecting Communication: Hearing impaired  Cognition Arousal: Alert Behavior During Therapy: [facility] for  tasks assessed/performed   PT - Cognitive impairments: No apparent impairments                         Following commands: Intact      Cueing    Exercises      General Comments        Pertinent Vitals/Pain Pain Assessment Pain Assessment: No/denies pain (no reports of back pain today)    Home Living                          Prior Function            PT Goals (current goals can now be found in the care plan section) Acute Rehab PT Goals Patient Stated  Goal: to be able to get back to my cat PT Goal Formulation: With patient Time For Goal Achievement: 04/03/24 Potential to Achieve Goals: Good Progress towards PT goals: Progressing toward goals    Frequency    Min 3X/week      PT Plan      Co-evaluation              AM-PAC PT 6 Clicks Mobility   Outcome Measure  Help needed turning from your back to your side while in a flat bed without using bedrails?: None Help needed moving from lying on your back to sitting on the side of a flat bed without using bedrails?: None Help needed moving to and from a bed to a chair (including a wheelchair)?: None Help needed standing up from a chair using your arms (e.g., wheelchair or bedside chair)?: None Help needed to walk in hospital room?: A Little Help needed climbing 3-5 steps with a railing? : A Little 6 Click Score: 22    End of Session Equipment Utilized During Treatment: Gait belt Activity Tolerance: Other (comment) (limited due to SOB) Patient left: in chair;with call bell/phone within reach Nurse Communication: Mobility status;Other (comment) (O2 sats) PT Visit Diagnosis: Unsteadiness on feet (R26.81);History of falling (Z91.81);Other abnormalities of gait and mobility (R26.89)     Time: 4098-1191 PT Time Calculation (min) (ACUTE ONLY): 14 min  Charges:    $Gait Training: 8-22 mins PT General Charges $$ ACUTE PT VISIT: 1 Visit                     Cary Clarks, PT Acute Rehab    Annalee Kiang 03/22/2024, 12:04 PM

## 2024-03-22 NOTE — TOC Transition Note (Addendum)
 Transition of Care [facility]) - Discharge Note   Patient Details  Name: [name] MRN: [id number] Date of Birth: 1945/11/01  Transition of Care [facility]) CM/SW Contact:  Marty Sleet, LCSW Phone Number: 03/22/2024, 1:59 PM   Clinical Narrative:    Pt to discharge home. Pt will continue to receive HHPT through [facility]. HH order in place.  Pt has new O2 requirement. Pt does not have preference for DME agency. Home O2 ordered through [employer]. [employer] to deliver travel tank to pt's room prior to discharge.  Pt's friend to provide transportation home. No further TOC needs identified. TOC signing off.    Final next level of care: Home w Home Health Services Barriers to Discharge: Barriers Resolved   Patient Goals and CMS Choice Patient states their goals for this hospitalization and ongoing recovery are:: Home with self CMS Medicare.gov Compare Post Acute Care list provided to::  (NA) Choice offered to / list presented to : NA [place] ownership interest in [facility].provided to:: Parent NA    Discharge Placement                       Discharge Plan and Services Additional resources added to the After Visit Summary for   In-house Referral: NA Discharge Planning Services: CM Consult Post Acute Care Choice: NA          DME Arranged: Oxygen DME Agency: [employer] Date DME Agency Contacted: 03/22/24 Time DME Agency Contacted: 1359 Representative spoke with at DME Agency: Zula Hitch HH Arranged: NA HH Agency: NA        Social Drivers of Health (SDOH) Interventions SDOH Screenings   Food Insecurity: No Food Insecurity (03/19/2024)  Housing: Low Risk  (03/19/2024)  Transportation Needs: No Transportation Needs (03/19/2024)  Utilities: Not At Risk (03/19/2024)  Alcohol  Screen: Low Risk  (11/03/2023)  Depression (PHQ2-9): Medium Risk (03/14/2024)  Financial Resource Strain: Low Risk  (11/03/2023)  Physical Activity: Unknown (11/03/2023)  Social  Connections: Moderately Integrated (03/19/2024)  Stress: No Stress Concern Present (11/03/2023)  Tobacco Use: Medium Risk (03/18/2024)     Readmission Risk Interventions    03/22/2024   10:40 AM  Readmission Risk Prevention Plan  Transportation Screening Complete  PCP or Specialist Appt within 5-7 Days Complete  Home Care Screening Complete  Medication Review (RN CM) Complete

## 2024-03-22 NOTE — Progress Notes (Signed)
 SATURATION QUALIFICATIONS: (This note is used to comply with regulatory documentation for home oxygen)  Patient Saturations on Room Air at Rest = 91%  Patient Saturations on Room Air while Ambulating = 88%  Patient Saturations on 2 Liters of oxygen while Ambulating = 95%  Please briefly explain why patient needs home oxygen:  Pt needs oxygen because when pt ambulates on room air, O2 saturation drops to 88%. O2 saturation 95% on 2L while ambulating.

## 2024-03-22 NOTE — Progress Notes (Signed)
 AVS reviewed with pt and all questions answered. All belongings packed and sent with pt. Pt waited for O2 tank to be delivered to room since 1430. Pt stated she was unable to wait any longer due to her ride arriving at 1735. This RN messaged case manager, CM on call and informed them that pt needs O2 delivered to the home. This RN also called the home health [phone]- they stated that they would update pt once they were able to contact medical equipment agency. Pt was informed of all this and stated that she would leave. AVS does have home health number and pt is aware.

## 2024-03-22 NOTE — TOC Initial Note (Signed)
 Transition of Care [facility]) - Initial/Assessment Note    Patient Details  Name: [name] MRN: [id number] Date of Birth: 01/04/1946  Transition [facility] Care [facility]) CM/SW Contact:    Valley Gavia, LCSWA Phone Number: 03/22/2024, 6:03 PM  Clinical Narrative:                  CSW spoke with [employer] afterhours, they state no case was put in for pt so that is why o2 was not delivered. CSW provided needed details, representative states they will create a case for pt and reach out in an hour about delivery, CSW spoke with pt and advised the same.   Expected Discharge Plan: Home/Self Care Barriers to Discharge: Barriers Resolved   Patient Goals and CMS Choice Patient states their goals for this hospitalization and ongoing recovery are:: Home with self CMS Medicare.gov Compare Post Acute Care list provided to::  (NA) Choice offered to / list presented to : NA [place] ownership interest in [facility].provided to:: Parent NA    Expected Discharge Plan and Services In-house Referral: NA Discharge Planning Services: CM Consult Post Acute Care Choice: NA Living arrangements for the past 2 months: Single Family Home Expected Discharge Date: 03/22/24               DME Arranged: Oxygen DME Agency: [employer] Date DME Agency Contacted: 03/22/24 Time DME Agency Contacted: 1359 Representative spoke with at DME Agency: Zula Hitch HH Arranged: NA HH Agency: NA        Prior Living Arrangements/Services Living arrangements for the past 2 months: Single Family Home Lives with:: Self Patient language and need for interpreter reviewed:: Yes Do you feel safe going back to the place where you live?: Yes      Need for Family Participation in Patient Care: No (Comment) Care giver support system in place?: Yes (comment) Current home services: DME (cane, [facility]) Criminal Activity/Legal Involvement Pertinent to Current Situation/Hospitalization: No - Comment as  needed  Activities of Daily Living   ADL Screening (condition at time of admission) Independently performs ADLs?: Yes (appropriate for developmental age) Is the patient deaf or have difficulty hearing?: Yes Does the patient have difficulty seeing, even when wearing glasses/contacts?: No Does the patient have difficulty concentrating, remembering, or making decisions?: No  Permission Sought/Granted Permission sought to share information with : Case Manager Permission granted to share information with : Yes, Verbal Permission Granted  Share Information with NAME: Bev Adam Holm and Pasco Bond     Permission granted to share info w Relationship: Friends  Permission granted to share info w Contact Information: [phone]   [phone]  Emotional Assessment Appearance:: Appears stated age Attitude/Demeanor/Rapport: Engaged Affect (typically observed): Appropriate Orientation: : Oriented to Self, Oriented to Place, Oriented to  Time, Oriented to Situation Alcohol  / Substance Use: Not Applicable Psych Involvement: No (comment)  Admission diagnosis:  Bronchopneumonia [J18.0] Elevated troponin [R79.89] COPD exacerbation (HCC) [J44.1] CAP (community acquired pneumonia) [J18.9] Elevated brain natriuretic peptide (BNP) level [R79.89] Acute respiratory failure with hypoxia (HCC) [J96.01] Patient Active Problem List   Diagnosis Date Noted   Bronchopneumonia 03/20/2024   Elevated brain natriuretic peptide (BNP) level 03/20/2024   CAP (community acquired pneumonia) 03/19/2024   Blood blister 03/14/2024   Ear fullness, bilateral 12/05/2023   Pressure sore on heel 06/15/2023   Lump of skin 06/06/2023   Head injury 04/24/2023   Fall with injury 03/13/2023   Imbalance 03/13/2023   Venous ulcer of leg (HCC) 01/24/2023  Fatigue 10/05/2022   Positive ANA (antinuclear antibody) 05/30/2022   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   Lacunar infarction [facility])    Aortic atherosclerosis  03/03/2022   Peripheral neuropathy 01/03/2022   Hematuria 06/17/2021   Pulmonary artery hypertension 06/17/2021   Pacemaker 03/02/2021   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   Ascending aortic aneurysm (HCC) 03/02/2021   Lymphangioma 01/07/2021   Persistent atrial fibrillation 12/09/2020   Carotid stenosis 12/08/2020   Left hip pain 07/29/2020   Right leg swelling 06/11/2020   History of vertebral fracture 11/26/2019   Acute midline low back pain without sciatica 11/26/2019   Thrombocytopenia 11/04/2019   Anticoagulant long-term use 06/14/2019   Atrial flutter (HCC) 05/15/2019   DISH (diffuse idiopathic skeletal hyperostosis) 05/14/2019   Pulmonary infiltrates 04/23/2019   Other spondylosis with radiculopathy, lumbar region 07/17/2018   Syncope 05/28/2018   Arthropathy of spinal facet joint 03/08/2018   Malaise and fatigue 01/22/2018   Pain of right sacroiliac joint 08/09/2017   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 12/31/2016   Vitamin D  deficiency 10/23/2016   Bilateral hip pain 09/08/2016   History of colonic polyps    Pedal edema 02/25/2016   Skin rash 12/22/2015   Advanced care planning/counseling discussion 10/28/2015   CKD (chronic kidney disease) stage 2, GFR 60-89 ml/min 07/03/2015   Dyspnea on exertion 06/05/2015   Mixed stress and urge urinary incontinence 03/16/2015   Vitamin B12 deficiency 02/28/2015   Chronic insomnia 02/25/2015   COPD exacerbation (HCC) 12/11/2014   Osteoporosis 12/09/2014   Medicare annual wellness visit, subsequent 10/15/2014   Health maintenance examination 10/15/2014   Adjustment disorder with mixed anxiety and depressed mood 10/15/2014   Chest pain 08/25/2014   Mitral valve replaced 12/2013   Nausea 11/26/2013   Chronic heart failure with preserved ejection fraction (HFpEF) (HCC) 10/10/2013   Acute respiratory failure with hypoxia (HCC) 10/10/2013   Pulmonary nodules 02/23/2012   History of radiation therapy    Macrocytic  anemia 09/21/2011   History of rectal cancer 05/26/2011   Essential hypertension, benign 03/24/2009   Overweight (BMI 25.0-29.9) 03/05/2008   Chronic lower back pain 03/05/2008   Meralgia paresthetica 12/13/2007   GERD (gastroesophageal reflux disease) 08/06/2007   HLD (hyperlipidemia) 05/03/2007   COPD (chronic obstructive pulmonary disease) 05/03/2007   PCP:  Claire Crick, MD Pharmacy:   [id number] - [place], [place] - [address] AT [facility] [address] [facility] [address] [place] [address] Phone: [phone] Fax: [phone]  [facility] [place], [place] - [address] [place] [address] Phone: [phone] Fax: [phone]     Social Drivers of Health (SDOH) Social History: SDOH Screenings   Food Insecurity: No Food Insecurity (03/19/2024)  Housing: Low Risk  (03/19/2024)  Transportation Needs: No Transportation Needs (03/19/2024)  Utilities: Not At Risk (03/19/2024)  Alcohol  Screen: Low Risk  (11/03/2023)  Depression (PHQ2-9): Medium Risk (03/14/2024)  Financial Resource Strain: Low Risk  (11/03/2023)  Physical Activity: Unknown (11/03/2023)  Social Connections: Moderately Integrated (03/19/2024)  Stress: No Stress Concern Present (11/03/2023)  Tobacco Use: Medium Risk (03/18/2024)   SDOH Interventions: Food Insecurity Interventions: Intervention Not Indicated Housing Interventions: Intervention Not Indicated Transportation Interventions: Intervention Not Indicated Utilities Interventions: Intervention Not Indicated Social Connections Interventions: Intervention Not Indicated   Readmission Risk Interventions    03/22/2024   10:40 AM  Readmission Risk Prevention Plan  Transportation Screening Complete  PCP or Specialist Appt within  5-7 Days Complete  Home Care Screening Complete  Medication Review (RN CM) Complete

## 2024-03-22 NOTE — Progress Notes (Signed)
 SATURATION QUALIFICATIONS: (This note is used to comply with regulatory documentation for home oxygen)  Patient Saturations on Room Air at Rest = 91%  Patient Saturations on Room Air while Ambulating = 90%  Patient Saturations on 2 Liters of oxygen while Ambulating = 96-100%  Please briefly explain why patient needs home oxygen:

## 2024-03-22 NOTE — Care Management Important Message (Signed)
 Important Message  Patient Details IM Letter given to the Patient. Name: Cathryne Nakshatra Klose MRN: [id number] Date of Birth: 1945/12/16   Important Message Given:  Yes - Medicare IM     Curtiss Dowdy 03/22/2024, 9:57 AM

## 2024-03-24 ENCOUNTER — Other Ambulatory Visit: Payer: Self-pay | Admitting: Cardiovascular Disease

## 2024-03-24 ENCOUNTER — Other Ambulatory Visit: Payer: Self-pay | Admitting: Internal Medicine

## 2024-03-24 ENCOUNTER — Encounter: Payer: Self-pay | Admitting: Family Medicine

## 2024-03-24 ENCOUNTER — Encounter: Payer: Self-pay | Admitting: Internal Medicine

## 2024-03-24 DIAGNOSIS — J189 Pneumonia, unspecified organism: Secondary | ICD-10-CM

## 2024-03-24 DIAGNOSIS — J449 Chronic obstructive pulmonary disease, unspecified: Secondary | ICD-10-CM

## 2024-03-25 ENCOUNTER — Telehealth: Payer: Self-pay

## 2024-03-25 NOTE — Transitions of Care (Post Inpatient/ED Visit) (Addendum)
 03/25/2024  Name: [name] MRN: [id number] DOB: [birth year]  Today's TOC FU Call Status: Today's TOC FU Call Status:: Successful TOC FU Call Completed TOC FU Call Complete Date: 03/25/24 Patient's Name and Date of Birth confirmed.  Transition Care Management Follow-up Telephone Call Date of Discharge: 03/22/24 Discharge Facility: [facility]) Type of Discharge: Inpatient Admission How have you been since you were released from the hospital?: Same Any questions or concerns?: No  Items Reviewed: Did you receive and understand the discharge instructions provided?: Yes Medications obtained,verified, and reconciled?: Yes (Medications Reviewed) Dietary orders reviewed?: Yes Type of Diet Ordered:: low sodium Do you have support at home?: No (Friends follow up with her)  Medications Reviewed Today: Medications Reviewed Today     Reviewed by Jamie Mccoy, RN (Registered Nurse) on 03/25/24 at 1037  Med List Status: <None>   Medication Order Taking? Sig Documenting Provider Last Dose Status Informant  acetaminophen  (TYLENOL ) 500 MG tablet [id number] No Take 2 tablets (1,000 mg total) by mouth in the morning and at bedtime. Claire Crick, MD 03/18/2024 Active Self  acidophilus (RISAQUAD) CAPS capsule [id number] No Take 1 capsule by mouth daily. [provider] Taking Active Self  albuterol  (VENTOLIN  HFA) 108 (90 Base) MCG/ACT inhaler [id number] No Inhale 2 puffs into the lungs every 6 (six) hours as needed for wheezing. [name], MD Unknown Active Self  amoxicillin -clavulanate (AUGMENTIN ) 875-125 MG tablet [id number]  Take 1 tablet by mouth 2 (two) times daily. Barbee Lew, MD  Active   ascorbic acid  (VITAMIN C) 500 MG tablet [id number] No Take 1 tablet (500 mg total) by mouth daily. Claire Crick, MD 03/18/2024 Active Self  Biotin 1000 MCG tablet [id number] No Take 1,000 mcg by mouth daily. [provider] 03/18/2024 Active Self  buPROPion   (WELLBUTRIN  SR) 150 MG 12 hr tablet [id number] No TAKE 1 TABLET(150 MG) BY MOUTH EVERY MORNING Claire Crick, MD 03/18/2024 Morning Active Self  calcium  carbonate (OSCAL) 1500 (600 Ca) MG TABS tablet [id number] No Take 600 mg of elemental calcium  by mouth daily. [provider] 03/18/2024 Active Self  carboxymethylcellulose (REFRESH PLUS) 0.5 % SOLN [id number] No Place 1 drop into both eyes 3 (three) times daily as needed (dry eyes). [provider] Unknown Active Self  cholecalciferol (VITAMIN D3) 25 MCG (1000 UNIT) tablet [id number] No Take 1,000 Units by mouth daily. [provider] 03/18/2024 Active Self  docusate sodium  (COLACE) 100 MG capsule [id number] No Take 200 mg by mouth daily. [provider] 03/18/2024 Active Self  ELIQUIS  5 MG TABS tablet [id number]  TAKE 1 TABLET(5 MG) BY MOUTH TWICE DAILY Croitoru, Mihai, MD  Active   Ensifentrine  (OHTUVAYRE ) 3 MG/2.5ML SUSP [id number] No Inhale 1 Inhalation into the lungs in the morning and at bedtime. Twice daily via neb [provider] Past Week Active Self  ferrous sulfate  325 (65 FE) MG EC tablet [id number] No Take 1 tablet (325 mg total) by mouth daily with breakfast. Sumner Ends, MD 03/18/2024 Active Self  Fluticasone -Umeclidin-Vilant (TRELEGY ELLIPTA ) 200-62.5-25 MCG/ACT AEPB [id number] No Inhale 1 puff into the lungs daily. [name], MD 03/18/2024 Active Self  furosemide  (LASIX ) 40 MG tablet [id number] No TAKE 1 TABLET BY MOUTH EVERY DAY. MAY TAKE AN EXTRA TABLET AS NEEDED FOR SWELLING  Patient taking differently: Take 40 mg by mouth in the morning.   Croitoru, Mihai, MD 03/18/2024 Morning Active Self  gabapentin  (NEURONTIN ) 100 MG capsule [id number] No Take 1 capsule (100 mg total) by mouth 2 (two)  times daily as needed (back pain - for daytime use).  Patient not taking: Reported on 03/19/2024   Claire Crick, MD Not Taking Active Self           Med Note Guido Leeks, Sheryn Doom Aug 27, 2023  6:32 PM)  The patient said she will increase this to twice-a-day dosing when she is ready to do so  gabapentin  (NEURONTIN ) 300 MG capsule [id number] No Take 300 mg by mouth at bedtime. [provider] 03/18/2024 Active Self           Med Note Kolleen Perone   Fri Jun 23, 2023 12:31 PM)    hydrocortisone  (ANUSOL -HC) 2.5 % rectal cream [id number] No APPLY RECTALLY TO THE AFFECTED AREA TWICE DAILY Claire Crick, MD Unknown Active Self  lisinopril  (ZESTRIL ) 5 MG tablet [id number] No Take 5 mg by mouth daily. [provider] 03/18/2024 Active Self  lovastatin  (MEVACOR ) 40 MG tablet [id number] No TAKE 1 TABLET(40 MG) BY MOUTH AT BEDTIME Claire Crick, MD 03/18/2024 Active Self  metoprolol  succinate (TOPROL  XL) 25 MG 24 hr tablet [id number] No Take 1 tablet (25 mg total) by mouth every evening. Thomasena Fleming, NP 03/18/2024 Active Self  montelukast  (SINGULAIR ) 10 MG tablet [id number] No TAKE 1 TABLET(10 MG) BY MOUTH AT BEDTIME [name], MD 03/18/2024 Active Self  Multiple Vitamins-Minerals (PRESERVISION AREDS 2 PO) [id number] No Take 2 capsules by mouth in the morning. [provider] 03/18/2024 Active Self  ondansetron  (ZOFRAN ) 4 MG tablet [id number] No Take 1 tablet (4 mg total) by mouth every 8 (eight) hours as needed for nausea or vomiting. Claire Crick, MD Past Week Active Self  Phenazopyrid-Cranbry-C-Probiot (AZO URINARY TRACT SUPPORT PO) [id number] No Take 1 capsule by mouth daily. [provider] 03/18/2024 Active Self  polyethylene glycol (MIRALAX  / GLYCOLAX ) packet [id number] No Take 17 g by mouth daily.  Patient taking differently: Take 17 g by mouth daily as needed for mild constipation.   Sherry Dome, PA-C Unknown Active Self           Med Note Kolleen Perone   Fri Jun 23, 2023 12:30 PM)    potassium chloride  (KLOR-CON ) 10 MEQ tablet [id number] No TAKE 2 TABLETS(20 MEQ) BY MOUTH DAILY Croitoru, Mihai, MD 03/18/2024 Active Self  predniSONE  (DELTASONE ) 20 MG  tablet [id number]  Take 2 tablets (40 mg total) by mouth daily with breakfast. Barbee Lew, MD  Active   promethazine  (PHENERGAN ) 12.5 MG tablet [id number] No Take 1 tablet (12.5 mg total) by mouth every 8 (eight) hours as needed for nausea or vomiting. Claire Crick, MD Past Week Active Self  traMADol  (ULTRAM ) 50 MG tablet [id number] No Take 1-2 tablets (50-100 mg total) by mouth 2 (two) times daily as needed for moderate pain (pain score 4-6). Claire Crick, MD Unknown Active Self  traZODone  (DESYREL ) 50 MG tablet [id number] No TAKE 1/2 TO 1 TABLET(25 TO 50 MG) BY MOUTH AT BEDTIME  Patient taking differently: Take 50 mg by mouth at bedtime.   Claire Crick, MD 03/18/2024 Active Self  Vibegron  (GEMTESA ) 75 MG TABS [id number] No Take 75 mg by mouth daily. [provider] 03/18/2024 Active Self  vitamin B-12 (CYANOCOBALAMIN ) 500 MCG tablet [id number] No Take 500 mcg by mouth 2 (two) times a week. [provider] Past Week Active Self  Zinc  50 MG CAPS [id number] No Take 1 capsule (50 mg total) by mouth daily. Claire Crick, MD 03/18/2024 Active Self  Home Care and Equipment/Supplies: Were Home Health Services Ordered?: Yes Name of Home Health Agency:: [facility] Has Agency set up a time to come to your home?: Yes First Home Health Visit Date: 03/24/24 Any new equipment or medical supplies ordered?: Yes Name of Medical supply agency?: oxygen Were you able to get the equipment/medical supplies?: Yes Do you have any questions related to the use of the equipment/supplies?: Yes What questions do you have?: I am a fall risk and the oxygen tank is too big to maneuver with my cane Oxygen from [employer] noted   Functional Questionnaire: Do you need assistance with bathing/showering or dressing?: No Do you need assistance with meal preparation?: No Do you need assistance with eating?: No  Follow up appointments reviewed: PCP Follow-up appointment confirmed?:   (Request for virtual) 03/29/24 at 11:30 AM  SDOH Interventions Today    Flowsheet Row Most Recent Value  SDOH Interventions   Food Insecurity Interventions Intervention Not Indicated  Housing Interventions Intervention Not Indicated  Transportation Interventions Intervention Not Indicated  Utilities Interventions Intervention Not Indicated     Goals Addressed             This Visit's Progress    [facility] Transitions of Care (TOC) Care Plan       Problems:  Recent Hospitalization for treatment of COPD Functional/Safety concern: Using ambulating with long oxygen tubing and desires small oxygen tank  Goal:  Over the next 30 days, the patient will not experience hospital readmission  Interventions:   COPD Interventions: Advised patient to engage in light exercise as tolerated 3-5 days a week to aid in the the management of COPD Discussed the importance of adequate rest and management of fatigue with COPD Provided patient with basic written and verbal COPD education on self care/management/and exacerbation prevention  Patient Self Care Activities:  Attend all scheduled provider appointments Call pharmacy for medication refills 3-7 days in advance of running out of medications Call provider office for new concerns or questions  Participate in Transition of Care Program/Attend [facility] scheduled calls  Plan:  Telephone follow up appointment with care management team member scheduled for:  04/02/24 at 10:00 AM The patient has been provided with contact information for the care management team and has been advised to call with any health related questions or concerns.         [place], RN, BSN, CCM [facility], [facility] RN Care Manager Direct Dial: [phone]

## 2024-03-25 NOTE — Telephone Encounter (Signed)
 Faxed order, via Epic, to [facility], in [place], at [phone].

## 2024-03-25 NOTE — Telephone Encounter (Signed)
 Prescription refill request for Eliquis  received. Indication:afib Last office visit:5/25 Scr:0.32  6/25 Age: 78 Weight:65  kg  Prescription refilled

## 2024-03-25 NOTE — Telephone Encounter (Signed)
 Printed POC request and placed in LIsa's box.  See pt message Looks like she was discharged with referral to [facility]

## 2024-03-25 NOTE — Patient Instructions (Addendum)
 Visit Information  Thank you for taking time to visit with me today. Please don't hesitate to contact me if I can be of assistance to you before our next scheduled telephone appointment.  Our next appointment is by telephone on 04/02/2024 at 10:00 AM  Following is a copy of your care plan:   Goals Addressed             This Visit's Progress    VBCI Transitions of Care (TOC) Care Plan       Problems:  Recent Hospitalization for treatment of COPD Functional/Safety concern: Using ambulating with long oxygen tubing and desires small oxygen tank  Goal:  Over the next 30 days, the patient will not experience hospital readmission  Interventions:   COPD Interventions: Advised patient to engage in light exercise as tolerated 3-5 days a week to aid in the the management of COPD Discussed the importance of adequate rest and management of fatigue with COPD Provided patient with basic written and verbal COPD education on self care/management/and exacerbation prevention  Patient Self Care Activities:  Attend all scheduled provider appointments Call pharmacy for medication refills 3-7 days in advance of running out of medications Call provider office for new concerns or questions  Participate in Transition of Care Program/Attend [facility] scheduled calls  Plan:  Telephone follow up appointment with care management team member scheduled for:  04/02/24 at 10:00 AM The patient has been provided with contact information for the care management team and has been advised to call with any health related questions or concerns.         Patient verbalizes understanding of instructions and care plan provided today and agrees to view in MyChart. Active MyChart status and patient understanding of how to access instructions and care plan via MyChart confirmed with patient.     The patient has been provided with contact information for the care management team and has been advised to call with any health  related questions or concerns.  The care management team will reach out to the patient again over the next 5 - 10 business days.  Follow up with provider re: oxygen tank needs and refills she had already left messages in MyChart.  Please call the care guide team at [phone] if you need to cancel or reschedule your appointment.   Please call the [place]  National Suicide Prevention Lifeline: 442-211-4088 or TTY: 251-264-9032 TTY 657 702 3219) to talk to a trained counselor if you are experiencing a Mental Health or Behavioral Health Crisis or need someone to talk to.   [place], RN, BSN, CCM [facility], [facility] RN Care Manager Direct Dial: [phone]

## 2024-03-25 NOTE — Telephone Encounter (Signed)
 Faxed order to [facility] at [phone].

## 2024-03-26 NOTE — Telephone Encounter (Signed)
Routing to front for scheduling.

## 2024-03-29 ENCOUNTER — Encounter: Payer: Self-pay | Admitting: Family Medicine

## 2024-03-29 ENCOUNTER — Telehealth (INDEPENDENT_AMBULATORY_CARE_PROVIDER_SITE_OTHER): Admitting: Family Medicine

## 2024-03-29 VITALS — BP 128/77 | HR 94 | Ht 64.0 in | Wt 142.4 lb

## 2024-03-29 DIAGNOSIS — J441 Chronic obstructive pulmonary disease with (acute) exacerbation: Secondary | ICD-10-CM | POA: Diagnosis not present

## 2024-03-29 DIAGNOSIS — J9601 Acute respiratory failure with hypoxia: Secondary | ICD-10-CM

## 2024-03-29 DIAGNOSIS — J189 Pneumonia, unspecified organism: Secondary | ICD-10-CM | POA: Diagnosis not present

## 2024-03-29 DIAGNOSIS — I5032 Chronic diastolic (congestive) heart failure: Secondary | ICD-10-CM

## 2024-03-29 DIAGNOSIS — F5104 Psychophysiologic insomnia: Secondary | ICD-10-CM

## 2024-03-29 MED ORDER — PRO COMFORT SPACER ADULT MISC: 0 refills | Status: DC

## 2024-03-29 MED ORDER — AMOXICILLIN-POT CLAVULANATE 875-125 MG PO TABS: 1.0000 | ORAL_TABLET | Freq: Two times a day (BID) | ORAL | 0 refills | Status: DC

## 2024-03-29 NOTE — Telephone Encounter (Signed)
 Spoke with patient today - she has not heard from [facility] about POC. Can we call them or [facility] RN to get update on order? Thanks.

## 2024-03-29 NOTE — Telephone Encounter (Signed)
 Awaiting call for scheduling

## 2024-03-29 NOTE — Telephone Encounter (Signed)
 Called and spoke to [employer]. They were able to find order but didn't have liter flow on it. Per chart review home is set at 2. Ok to update order and resend.  Fax to [phone] once updated.

## 2024-03-29 NOTE — Progress Notes (Unsigned)
 Ph: [phone] Fax: [phone]   Patient ID: [name], female    DOB: March 10, 1946, 78 y.o.   MRN: [id number]  Virtual visit completed through MyChart, a video enabled telemedicine application. Due to national recommendations of social distancing due to COVID-19, a virtual visit is felt to be most appropriate for this patient at this time. Reviewed limitations, risks, security and privacy concerns of performing a virtual visit and the availability of in person appointments. I also reviewed that there may be a patient responsible charge related to this service. The patient agreed to proceed.   Patient location: home Provider location: [place] at [facility], office Persons participating in this virtual visit: patient, provider   If any vitals were documented, they were collected by patient at home unless specified below.    BP 128/77   Pulse 94   Ht 5' 4 (1.626 m)   Wt 142 lb 7 oz (64.6 kg)   SpO2 90%   BMI 24.45 kg/m    CC: hospital f/u visit  Subjective:   HPI: [name] is a 78 y.o. female presenting on 03/29/2024 for Hospitalization Follow-up (Admitted on 03/18/24 at [facility], dx COPD exacerbation; acute respiratory failure w/hypoxia; elevated BNP; elevated troponin; bronchopneumonia; community acquired PNA of L lung; HFpEF.)   Recent hospitalization for acute dCHF exacerbation complicated by CAP (bibasilar by CXR). Treated with IV diuretic and IV antibiotics. Initially needed 4L supplemental oxygen by [place] to maintain O2 saturation.  Hospital records reviewed. Med rec performed.  BNP elevated to 500s. Mildly elevated troponin due to demand ischemia.  CTA chest negative for acute PE.  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  Treated initially with azithromycin  and rocephin , discharged on augmentin  4 more days.  Also treated with solumedrol - discharged on prednisone  4d burst.  Discharged on 2L supplemental O2.    Since home, she's not been feeling well. Feels about the same as when she came out of the hospital.  O2 sats dropping with exertion - down to 88%. Once down to 78% after showering on Tuesday. She is needing supplemental O2. She is currently using oxygen concentrator. She asks about portable oxygen concentrator.  Trouble sleeping due to chest congestion.  This is despite using incentive spirometer.   No fevers/chills. No chest pain or leg swelling.  Ongoing productive cough worse at night.  Occ wheezing.  She continues Trelegy 1 puff daily - rec start using with spacer.  She had not been using albuterol  inhaler - will start BID for next few days.   Interestingly nausea has been better.   Home health set up with [facility], oxygen DME company is [employer].  Other follow up appointments scheduled: none ______________________________________________________________________ Hospital admission: 03/18/2024 Hospital discharge: 03/22/2024 TCM f/u phone call:  performed on 03/24/2024  Recommendations for Outpatient Follow-up:  Follow up with PCP in 1-2 weeks Please obtain BMP/CBC in one week  Discharge Diagnoses:  Active Problems:   Acute respiratory failure with hypoxia (HCC)   COPD exacerbation (HCC)   CAP (community acquired pneumonia)   Bronchopneumonia   Elevated brain natriuretic peptide (BNP) level     Relevant past medical, surgical, family and social history reviewed and updated as indicated. Interim medical history since our last visit reviewed. Allergies and medications reviewed and updated. Outpatient Medications Prior to Visit  Medication Sig Dispense Refill   acetaminophen  (TYLENOL ) 500 MG tablet Take 2 tablets (1,000 mg total) by mouth in the morning  and at bedtime.     acidophilus (RISAQUAD) CAPS capsule Take 1 capsule by mouth daily.     albuterol  (VENTOLIN  HFA) 108 (90 Base) MCG/ACT inhaler Inhale 2 puffs into the lungs every 6 (six) hours as needed for wheezing. 18 g 6    ascorbic acid  (VITAMIN C) 500 MG tablet Take 1 tablet (500 mg total) by mouth daily.     Biotin 1000 MCG tablet Take 1,000 mcg by mouth daily.     buPROPion  (WELLBUTRIN  SR) 150 MG 12 hr tablet TAKE 1 TABLET(150 MG) BY MOUTH EVERY MORNING 90 tablet 1   calcium  carbonate (OSCAL) 1500 (600 Ca) MG TABS tablet Take 600 mg of elemental calcium  by mouth daily.     carboxymethylcellulose (REFRESH PLUS) 0.5 % SOLN Place 1 drop into both eyes 3 (three) times daily as needed (dry eyes).     cephALEXin  (KEFLEX ) 250 MG/5ML suspension Take 5 mLs (250 mg total) by mouth daily. For UTI ppx     cholecalciferol (VITAMIN D3) 25 MCG (1000 UNIT) tablet Take 1,000 Units by mouth daily.     docusate sodium  (COLACE) 100 MG capsule Take 200 mg by mouth daily.     ELIQUIS  5 MG TABS tablet TAKE 1 TABLET(5 MG) BY MOUTH TWICE DAILY 180 tablet 1   Ensifentrine  (OHTUVAYRE ) 3 MG/2.5ML SUSP Inhale 1 Inhalation into the lungs in the morning and at bedtime. Twice daily via neb     ferrous sulfate  325 (65 FE) MG EC tablet Take 1 tablet (325 mg total) by mouth daily with breakfast.     Fluticasone -Umeclidin-Vilant (TRELEGY ELLIPTA ) 200-62.5-25 MCG/ACT AEPB Inhale 1 puff into the lungs daily. 60 each 11   furosemide  (LASIX ) 40 MG tablet TAKE 1 TABLET BY MOUTH EVERY DAY. MAY TAKE AN EXTRA TABLET AS NEEDED FOR SWELLING (Patient taking differently: Take 40 mg by mouth in the morning.) 135 tablet 2   gabapentin  (NEURONTIN ) 100 MG capsule Take 1 capsule (100 mg total) by mouth 2 (two) times daily as needed (back pain - for daytime use). 60 capsule 1   gabapentin  (NEURONTIN ) 300 MG capsule Take 300 mg by mouth at bedtime.     hydrocortisone  (ANUSOL -HC) 2.5 % rectal cream APPLY RECTALLY TO THE AFFECTED AREA TWICE DAILY 30 g 0   lisinopril  (ZESTRIL ) 5 MG tablet Take 5 mg by mouth daily.     lovastatin  (MEVACOR ) 40 MG tablet TAKE 1 TABLET(40 MG) BY MOUTH AT BEDTIME 90 tablet 2   metoprolol  succinate (TOPROL  XL) 25 MG 24 hr tablet Take 1 tablet  (25 mg total) by mouth every evening. 90 tablet 3   montelukast  (SINGULAIR ) 10 MG tablet TAKE 1 TABLET(10 MG) BY MOUTH AT BEDTIME 90 tablet 1   Multiple Vitamins-Minerals (PRESERVISION AREDS 2 PO) Take 2 capsules by mouth in the morning.     ondansetron  (ZOFRAN ) 4 MG tablet Take 1 tablet (4 mg total) by mouth every 8 (eight) hours as needed for nausea or vomiting. 20 tablet 0   Phenazopyrid-Cranbry-C-Probiot (AZO URINARY TRACT SUPPORT PO) Take 1 capsule by mouth daily.     polyethylene glycol (MIRALAX  / GLYCOLAX ) packet Take 17 g by mouth daily. (Patient taking differently: Take 17 g by mouth daily as needed for mild constipation.) 14 each 0   potassium chloride  (KLOR-CON ) 10 MEQ tablet TAKE 2 TABLETS(20 MEQ) BY MOUTH DAILY 180 tablet 1   promethazine  (PHENERGAN ) 12.5 MG tablet Take 1 tablet (12.5 mg total) by mouth every 8 (eight) hours as needed for nausea or  vomiting.     traMADol  (ULTRAM ) 50 MG tablet Take 1-2 tablets (50-100 mg total) by mouth 2 (two) times daily as needed for moderate pain (pain score 4-6). 90 tablet 0   Vibegron  (GEMTESA ) 75 MG TABS Take 75 mg by mouth daily.     vitamin B-12 (CYANOCOBALAMIN ) 500 MCG tablet Take 500 mcg by mouth 2 (two) times a week.     Zinc  50 MG CAPS Take 1 capsule (50 mg total) by mouth daily.  0   traZODone  (DESYREL ) 50 MG tablet TAKE 1/2 TO 1 TABLET(25 TO 50 MG) BY MOUTH AT BEDTIME (Patient taking differently: Take 50 mg by mouth at bedtime.) 30 tablet 5   traZODone  (DESYREL ) 50 MG tablet Take 1 tablet (50 mg total) by mouth at bedtime.     amoxicillin -clavulanate (AUGMENTIN ) 875-125 MG tablet Take 1 tablet by mouth 2 (two) times daily. 8 tablet 0   predniSONE  (DELTASONE ) 20 MG tablet Take 2 tablets (40 mg total) by mouth daily with breakfast. 8 tablet 0   No facility-administered medications prior to visit.     Per HPI unless specifically indicated in ROS section below Review of Systems Objective:  BP 128/77   Pulse 94   Ht 5' 4 (1.626 m)    Wt 142 lb 7 oz (64.6 kg)   SpO2 90%   BMI 24.45 kg/m   Wt Readings from Last 3 Encounters:  03/29/24 142 lb 7 oz (64.6 kg)  03/19/24 143 lb 4.8 oz (65 kg)  03/14/24 143 lb 4 oz (65 kg)       Physical exam: Gen: alert, tired appearing, using supplemental O2 with [place] in place Pulm: speaks in complete sentences, intermittent wet sounding cough present Psych: normal mood, normal thought content      Results for orders placed or performed during the hospital encounter of 03/18/24  Basic metabolic panel   Collection Time: 03/18/24  4:15 PM  Result Value Ref Range   Sodium 138 135 - 145 mmol/L   Potassium 3.8 3.5 - 5.1 mmol/L   Chloride 100 98 - 111 mmol/L   CO2 29 22 - 32 mmol/L   Glucose, Bld 93 70 - 99 mg/dL   BUN 17 8 - 23 mg/dL   Creatinine, Ser 1.61 0.44 - 1.00 mg/dL   Calcium  8.9 8.9 - 10.3 mg/dL   GFR, Estimated >09 >60 mL/min   Anion gap 9 5 - 15  CBC   Collection Time: 03/18/24  4:15 PM  Result Value Ref Range   WBC 6.8 4.0 - 10.5 K/uL   RBC 3.89 3.87 - 5.11 MIL/uL   Hemoglobin 13.1 12.0 - 15.0 g/dL   HCT 45.4 09.8 - 11.9 %   MCV 108.7 (H) 80.0 - 100.0 fL   MCH 33.7 26.0 - 34.0 pg   MCHC 31.0 30.0 - 36.0 g/dL   RDW 14.7 82.9 - 56.2 %   Platelets 81 (L) 150 - 400 K/uL   nRBC 0.0 0.0 - 0.2 %  BNP (Order if Patient has history of Heart Failure)   Collection Time: 03/18/24  4:15 PM  Result Value Ref Range   B Natriuretic Peptide 513.9 (H) 0.0 - 100.0 pg/mL  Troponin I (High Sensitivity)   Collection Time: 03/18/24 10:47 PM  Result Value Ref Range   Troponin I (High Sensitivity) 64 (H) <18 ng/L  Troponin I (High Sensitivity)   Collection Time: 03/19/24 12:02 AM  Result Value Ref Range   Troponin I (High Sensitivity) 76 (H) <18  ng/L  Basic metabolic panel   Collection Time: 03/20/24  4:43 AM  Result Value Ref Range   Sodium 138 135 - 145 mmol/L   Potassium 4.0 3.5 - 5.1 mmol/L   Chloride 101 98 - 111 mmol/L   CO2 30 22 - 32 mmol/L   Glucose, Bld 133 (H) 70 -  99 mg/dL   BUN 27 (H) 8 - 23 mg/dL   Creatinine, Ser 1.61 (L) 0.44 - 1.00 mg/dL   Calcium  9.1 8.9 - 10.3 mg/dL   GFR, Estimated >09 >60 mL/min   Anion gap 7 5 - 15  CBC   Collection Time: 03/20/24  4:43 AM  Result Value Ref Range   WBC 6.7 4.0 - 10.5 K/uL   RBC 3.57 (L) 3.87 - 5.11 MIL/uL   Hemoglobin 12.0 12.0 - 15.0 g/dL   HCT 45.4 09.8 - 11.9 %   MCV 106.7 (H) 80.0 - 100.0 fL   MCH 33.6 26.0 - 34.0 pg   MCHC 31.5 30.0 - 36.0 g/dL   RDW 14.7 82.9 - 56.2 %   Platelets 72 (L) 150 - 400 K/uL   nRBC 0.0 0.0 - 0.2 %   *Note: Due to a large number of results and/or encounters for the requested time period, some results have not been displayed. A complete set of results can be found in Results Review.   Assessment & Plan:   Chronic insomnia  Other orders -     Amoxicillin -Pot Clavulanate; Take 1 tablet by mouth 2 (two) times daily for 7 days.  Dispense: 14 tablet; Refill: 0 -     Pro Comfort Spacer Adult; To use with inhalers  Dispense: 1 each; Refill: 0     I discussed the assessment and treatment plan with the patient. The patient was provided an opportunity to ask questions and all were answered. The patient agreed with the plan and demonstrated an understanding of the instructions. The patient was advised to call back or seek an in-person evaluation if the symptoms worsen or if the condition fails to improve as anticipated.  Follow up plan: Return in about 1 week (around 04/05/2024) for follow up visit.  Claire Crick, MD

## 2024-03-29 NOTE — Patient Instructions (Addendum)
 Extend augmentin  x7d Start mucinex   Start OTC probiotic towards end of antibiotic course Use spacer with inhaler  Return 1-2 weeks

## 2024-03-31 ENCOUNTER — Encounter: Payer: Self-pay | Admitting: Family Medicine

## 2024-04-01 NOTE — Assessment & Plan Note (Addendum)
 Chronic, severe, followed by pulm, recently treated with abx and steroids. Continues trelegy with PRN albuterol . Suggested she use spacer for inhalers.  Did not extend prednisone  course.

## 2024-04-01 NOTE — Assessment & Plan Note (Signed)
 Trazodone  refilled - takes 50mg  nightly.

## 2024-04-01 NOTE — Telephone Encounter (Addendum)
 Faxed new order to [employer] at [phone].

## 2024-04-01 NOTE — Assessment & Plan Note (Signed)
 Acute CHF exacerbation complicated hospitalization, treated with IV diuresis. Continue supplemental oxygen.  Continue lasix  40mg  daily, with potassium 10mEq daily.

## 2024-04-01 NOTE — Assessment & Plan Note (Addendum)
 New supplemental oxygen use 2L by [place]- continue this. Will order portable oxygen concentrator , ask [facility] to assist with this

## 2024-04-01 NOTE — Assessment & Plan Note (Signed)
 Hospital records reviewed. Completed treatment with azithromycin / rocephin  followed by augmentin . Ongoing malaise, hypoxia, productive cough worse at night.  Due to high risk comorbidities (COPD, CHF), will extend augmentin  abx x7d.  Also rec touch base with pulmonology.

## 2024-04-02 ENCOUNTER — Other Ambulatory Visit: Payer: Self-pay

## 2024-04-02 ENCOUNTER — Emergency Department (HOSPITAL_COMMUNITY)

## 2024-04-02 ENCOUNTER — Telehealth

## 2024-04-02 ENCOUNTER — Inpatient Hospital Stay (HOSPITAL_COMMUNITY)
Admission: EM | Admit: 2024-04-02 | Discharge: 2024-04-10 | DRG: 193 | Disposition: A | Discharge status: Skilled Nursing Facility | Attending: Family Medicine | Admitting: Family Medicine

## 2024-04-02 DIAGNOSIS — J9621 Acute and chronic respiratory failure with hypoxia: Secondary | ICD-10-CM | POA: Diagnosis present

## 2024-04-02 DIAGNOSIS — Y92008 Other place in unspecified non-institutional (private) residence as the place of occurrence of the external cause: Secondary | ICD-10-CM

## 2024-04-02 DIAGNOSIS — Z950 Presence of cardiac pacemaker: Secondary | ICD-10-CM

## 2024-04-02 DIAGNOSIS — S41111A Laceration without foreign body of right upper arm, initial encounter: Secondary | ICD-10-CM | POA: Diagnosis present

## 2024-04-02 DIAGNOSIS — E669 Obesity, unspecified: Secondary | ICD-10-CM | POA: Diagnosis present

## 2024-04-02 DIAGNOSIS — N3281 Overactive bladder: Secondary | ICD-10-CM | POA: Diagnosis present

## 2024-04-02 DIAGNOSIS — J440 Chronic obstructive pulmonary disease with acute lower respiratory infection: Secondary | ICD-10-CM | POA: Diagnosis present

## 2024-04-02 DIAGNOSIS — I2489 Other forms of acute ischemic heart disease: Secondary | ICD-10-CM | POA: Diagnosis present

## 2024-04-02 DIAGNOSIS — J189 Pneumonia, unspecified organism: Principal | ICD-10-CM | POA: Diagnosis present

## 2024-04-02 DIAGNOSIS — Z9049 Acquired absence of other specified parts of digestive tract: Secondary | ICD-10-CM

## 2024-04-02 DIAGNOSIS — I350 Nonrheumatic aortic (valve) stenosis: Secondary | ICD-10-CM | POA: Diagnosis present

## 2024-04-02 DIAGNOSIS — Z85048 Personal history of other malignant neoplasm of rectum, rectosigmoid junction, and anus: Secondary | ICD-10-CM

## 2024-04-02 DIAGNOSIS — H548 Legal blindness, as defined in USA: Secondary | ICD-10-CM | POA: Diagnosis present

## 2024-04-02 DIAGNOSIS — R54 Age-related physical debility: Secondary | ICD-10-CM | POA: Diagnosis present

## 2024-04-02 DIAGNOSIS — L89316 Pressure-induced deep tissue damage of right buttock: Secondary | ICD-10-CM | POA: Diagnosis present

## 2024-04-02 DIAGNOSIS — E1122 Type 2 diabetes mellitus with diabetic chronic kidney disease: Secondary | ICD-10-CM | POA: Diagnosis present

## 2024-04-02 DIAGNOSIS — Z23 Encounter for immunization: Secondary | ICD-10-CM

## 2024-04-02 DIAGNOSIS — I5033 Acute on chronic diastolic (congestive) heart failure: Secondary | ICD-10-CM | POA: Diagnosis present

## 2024-04-02 DIAGNOSIS — Z8616 Personal history of COVID-19: Secondary | ICD-10-CM | POA: Diagnosis not present

## 2024-04-02 DIAGNOSIS — Z953 Presence of xenogenic heart valve: Secondary | ICD-10-CM | POA: Diagnosis not present

## 2024-04-02 DIAGNOSIS — I2721 Secondary pulmonary arterial hypertension: Secondary | ICD-10-CM | POA: Diagnosis present

## 2024-04-02 DIAGNOSIS — N179 Acute kidney failure, unspecified: Secondary | ICD-10-CM | POA: Diagnosis present

## 2024-04-02 DIAGNOSIS — R0602 Shortness of breath: Secondary | ICD-10-CM | POA: Diagnosis not present

## 2024-04-02 DIAGNOSIS — D61818 Other pancytopenia: Secondary | ICD-10-CM | POA: Diagnosis present

## 2024-04-02 DIAGNOSIS — Z7901 Long term (current) use of anticoagulants: Secondary | ICD-10-CM

## 2024-04-02 DIAGNOSIS — I130 Hypertensive heart and chronic kidney disease with heart failure and stage 1 through stage 4 chronic kidney disease, or unspecified chronic kidney disease: Secondary | ICD-10-CM | POA: Diagnosis present

## 2024-04-02 DIAGNOSIS — I4811 Longstanding persistent atrial fibrillation: Secondary | ICD-10-CM | POA: Diagnosis present

## 2024-04-02 DIAGNOSIS — I77810 Thoracic aortic ectasia: Secondary | ICD-10-CM

## 2024-04-02 DIAGNOSIS — Z8249 Family history of ischemic heart disease and other diseases of the circulatory system: Secondary | ICD-10-CM | POA: Diagnosis not present

## 2024-04-02 DIAGNOSIS — L89326 Pressure-induced deep tissue damage of left buttock: Secondary | ICD-10-CM | POA: Diagnosis present

## 2024-04-02 DIAGNOSIS — Z923 Personal history of irradiation: Secondary | ICD-10-CM

## 2024-04-02 DIAGNOSIS — E785 Hyperlipidemia, unspecified: Secondary | ICD-10-CM | POA: Diagnosis present

## 2024-04-02 DIAGNOSIS — Z79899 Other long term (current) drug therapy: Secondary | ICD-10-CM

## 2024-04-02 DIAGNOSIS — Z981 Arthrodesis status: Secondary | ICD-10-CM

## 2024-04-02 DIAGNOSIS — S81811A Laceration without foreign body, right lower leg, initial encounter: Secondary | ICD-10-CM | POA: Diagnosis present

## 2024-04-02 DIAGNOSIS — J9601 Acute respiratory failure with hypoxia: Secondary | ICD-10-CM | POA: Diagnosis not present

## 2024-04-02 DIAGNOSIS — I340 Nonrheumatic mitral (valve) insufficiency: Secondary | ICD-10-CM | POA: Diagnosis not present

## 2024-04-02 DIAGNOSIS — Z803 Family history of malignant neoplasm of breast: Secondary | ICD-10-CM

## 2024-04-02 DIAGNOSIS — I342 Nonrheumatic mitral (valve) stenosis: Secondary | ICD-10-CM | POA: Diagnosis present

## 2024-04-02 DIAGNOSIS — W1830XA Fall on same level, unspecified, initial encounter: Secondary | ICD-10-CM | POA: Diagnosis present

## 2024-04-02 DIAGNOSIS — Z8619 Personal history of other infectious and parasitic diseases: Secondary | ICD-10-CM

## 2024-04-02 DIAGNOSIS — Z87891 Personal history of nicotine dependence: Secondary | ICD-10-CM | POA: Diagnosis not present

## 2024-04-02 DIAGNOSIS — Z823 Family history of stroke: Secondary | ICD-10-CM

## 2024-04-02 DIAGNOSIS — Z833 Family history of diabetes mellitus: Secondary | ICD-10-CM

## 2024-04-02 DIAGNOSIS — Z801 Family history of malignant neoplasm of trachea, bronchus and lung: Secondary | ICD-10-CM

## 2024-04-02 DIAGNOSIS — Z8673 Personal history of transient ischemic attack (TIA), and cerebral infarction without residual deficits: Secondary | ICD-10-CM

## 2024-04-02 DIAGNOSIS — I4819 Other persistent atrial fibrillation: Secondary | ICD-10-CM | POA: Diagnosis not present

## 2024-04-02 DIAGNOSIS — R7989 Other specified abnormal findings of blood chemistry: Secondary | ICD-10-CM | POA: Diagnosis not present

## 2024-04-02 DIAGNOSIS — N182 Chronic kidney disease, stage 2 (mild): Secondary | ICD-10-CM | POA: Diagnosis present

## 2024-04-02 DIAGNOSIS — I5032 Chronic diastolic (congestive) heart failure: Secondary | ICD-10-CM | POA: Diagnosis not present

## 2024-04-02 DIAGNOSIS — Z7951 Long term (current) use of inhaled steroids: Secondary | ICD-10-CM

## 2024-04-02 DIAGNOSIS — Z86007 Personal history of in-situ neoplasm of skin: Secondary | ICD-10-CM

## 2024-04-02 DIAGNOSIS — I480 Paroxysmal atrial fibrillation: Secondary | ICD-10-CM | POA: Diagnosis not present

## 2024-04-02 LAB — CBC WITH DIFFERENTIAL/PLATELET
Abs Immature Granulocytes: 0.06 10*3/uL (ref 0.00–0.07)
Basophils Absolute: 0 10*3/uL (ref 0.0–0.1)
Basophils Relative: 0 %
Eosinophils Absolute: 0.1 10*3/uL (ref 0.0–0.5)
Eosinophils Relative: 1 %
HCT: 38.2 % (ref 36.0–46.0)
Hemoglobin: 12.3 g/dL (ref 12.0–15.0)
Immature Granulocytes: 1 %
Lymphocytes Relative: 2 %
Lymphs Abs: 0.2 10*3/uL — ABNORMAL LOW (ref 0.7–4.0)
MCH: 35.9 pg — ABNORMAL HIGH (ref 26.0–34.0)
MCHC: 32.2 g/dL (ref 30.0–36.0)
MCV: 111.4 fL — ABNORMAL HIGH (ref 80.0–100.0)
Monocytes Absolute: 0.8 10*3/uL (ref 0.1–1.0)
Monocytes Relative: 7 %
Neutro Abs: 10.3 10*3/uL — ABNORMAL HIGH (ref 1.7–7.7)
Neutrophils Relative %: 89 %
Platelets: 81 10*3/uL — ABNORMAL LOW (ref 150–400)
RBC: 3.43 MIL/uL — ABNORMAL LOW (ref 3.87–5.11)
RDW: 13.4 % (ref 11.5–15.5)
WBC: 11.5 10*3/uL — ABNORMAL HIGH (ref 4.0–10.5)
nRBC: 0 % (ref 0.0–0.2)

## 2024-04-02 LAB — URINALYSIS, W/ REFLEX TO CULTURE (INFECTION SUSPECTED)
Bilirubin Urine: NEGATIVE
Glucose, UA: NEGATIVE mg/dL
Hgb urine dipstick: NEGATIVE
Ketones, ur: 5 mg/dL — AB
Nitrite: NEGATIVE
Protein, ur: 30 mg/dL — AB
Specific Gravity, Urine: 1.021 (ref 1.005–1.030)
WBC, UA: >50 WBC/hpf (ref 0–5)
pH: 5 (ref 5.0–8.0)

## 2024-04-02 LAB — TROPONIN I (HIGH SENSITIVITY)
Troponin I (High Sensitivity): 86 ng/L — ABNORMAL HIGH (ref <18)
Troponin I (High Sensitivity): 78 ng/L — ABNORMAL HIGH (ref <18)

## 2024-04-02 LAB — COMPREHENSIVE METABOLIC PANEL WITH GFR
ALT: 12 U/L (ref 0–44)
AST: 17 U/L (ref 15–41)
Albumin: 2.8 g/dL — ABNORMAL LOW (ref 3.5–5.0)
Alkaline Phosphatase: 74 U/L (ref 38–126)
Anion gap: 11 (ref 5–15)
BUN: 31 mg/dL — ABNORMAL HIGH (ref 8–23)
CO2: 36 mmol/L — ABNORMAL HIGH (ref 22–32)
Calcium: 9.3 mg/dL (ref 8.9–10.3)
Chloride: 93 mmol/L — ABNORMAL LOW (ref 98–111)
Creatinine, Ser: 1.12 mg/dL — ABNORMAL HIGH (ref 0.44–1.00)
GFR, Estimated: 50 mL/min — ABNORMAL LOW (ref >60)
Glucose, Bld: 114 mg/dL — ABNORMAL HIGH (ref 70–99)
Potassium: 3.8 mmol/L (ref 3.5–5.1)
Sodium: 140 mmol/L (ref 135–145)
Total Bilirubin: 0.7 mg/dL (ref 0.0–1.2)
Total Protein: 6.9 g/dL (ref 6.5–8.1)

## 2024-04-02 LAB — MAGNESIUM: Magnesium: 2.1 mg/dL (ref 1.7–2.4)

## 2024-04-02 LAB — BRAIN NATRIURETIC PEPTIDE: B Natriuretic Peptide: 365.5 pg/mL — ABNORMAL HIGH (ref 0.0–100.0)

## 2024-04-02 LAB — AMMONIA: Ammonia: 19 umol/L (ref 9–35)

## 2024-04-02 LAB — MRSA NEXT GEN BY PCR, NASAL: MRSA by PCR Next Gen: NOT DETECTED

## 2024-04-02 LAB — TSH: TSH: 1.395 u[IU]/mL (ref 0.350–4.500)

## 2024-04-02 MED ORDER — ENSURE PLUS HIGH PROTEIN PO LIQD
237.0000 mL | Freq: Two times a day (BID) | ORAL | Status: DC
  Administered 2024-04-03 – 2024-04-05 (×3): 237 mL via ORAL

## 2024-04-02 MED ORDER — SODIUM CHLORIDE 0.9 % IV SOLN: 1.0000 g | Freq: Once | INTRAVENOUS | Status: DC

## 2024-04-02 MED ORDER — BUPROPION HCL ER (SR) 150 MG PO TB12
150.0000 mg | ORAL_TABLET | Freq: Every day | ORAL | Status: DC
  Administered 2024-04-02 – 2024-04-10 (×9): 150 mg via ORAL
  Filled 2024-04-02 (×9): qty 1

## 2024-04-02 MED ORDER — TRAZODONE HCL 100 MG PO TABS
50.0000 mg | ORAL_TABLET | Freq: Every day | ORAL | Status: DC
  Administered 2024-04-02 – 2024-04-09 (×8): 50 mg via ORAL
  Filled 2024-04-02 (×8): qty 1

## 2024-04-02 MED ORDER — TETANUS-DIPHTH-ACELL PERTUSSIS 5-2.5-18.5 LF-MCG/0.5 IM SUSY
0.5000 mL | PREFILLED_SYRINGE | Freq: Once | INTRAMUSCULAR | Status: AC
  Administered 2024-04-02: 0.5 mL via INTRAMUSCULAR
  Filled 2024-04-02: qty 0.5

## 2024-04-02 MED ORDER — ACETAMINOPHEN 650 MG RE SUPP
650.0000 mg | Freq: Four times a day (QID) | RECTAL | Status: DC | PRN
Start: 2024-04-02 — End: 2024-04-10

## 2024-04-02 MED ORDER — METHYLPREDNISOLONE SODIUM SUCC 40 MG IJ SOLR
40.0000 mg | Freq: Two times a day (BID) | INTRAMUSCULAR | Status: DC
  Administered 2024-04-02 – 2024-04-06 (×9): 40 mg via INTRAVENOUS
  Filled 2024-04-02 (×9): qty 1

## 2024-04-02 MED ORDER — PRAVASTATIN SODIUM 20 MG PO TABS
40.0000 mg | ORAL_TABLET | Freq: Every day | ORAL | Status: DC
  Administered 2024-04-02 – 2024-04-09 (×7): 40 mg via ORAL
  Filled 2024-04-02 (×8): qty 2

## 2024-04-02 MED ORDER — ONDANSETRON HCL 4 MG PO TABS: 4.0000 mg | ORAL_TABLET | Freq: Four times a day (QID) | ORAL | Status: DC | PRN

## 2024-04-02 MED ORDER — ALBUTEROL SULFATE (2.5 MG/3ML) 0.083% IN NEBU
2.5000 mg | INHALATION_SOLUTION | RESPIRATORY_TRACT | Status: DC | PRN
  Filled 2024-04-02: qty 3

## 2024-04-02 MED ORDER — ONDANSETRON HCL 4 MG/2ML IJ SOLN: 4.0000 mg | Freq: Four times a day (QID) | INTRAMUSCULAR | Status: DC | PRN

## 2024-04-02 MED ORDER — METOPROLOL SUCCINATE ER 25 MG PO TB24
25.0000 mg | ORAL_TABLET | Freq: Every day | ORAL | Status: DC
  Administered 2024-04-02 – 2024-04-09 (×7): 25 mg via ORAL
  Filled 2024-04-02 (×8): qty 1

## 2024-04-02 MED ORDER — SODIUM CHLORIDE 0.9 % IV SOLN
2.0000 g | Freq: Two times a day (BID) | INTRAVENOUS | Status: DC
  Administered 2024-04-02 – 2024-04-09 (×15): 2 g via INTRAVENOUS
  Filled 2024-04-02 (×15): qty 12.5

## 2024-04-02 MED ORDER — TRAZODONE HCL 50 MG PO TABS: 25.0000 mg | ORAL_TABLET | Freq: Every evening | ORAL | Status: DC | PRN

## 2024-04-02 MED ORDER — APIXABAN 5 MG PO TABS
5.0000 mg | ORAL_TABLET | Freq: Two times a day (BID) | ORAL | Status: DC
  Administered 2024-04-02 – 2024-04-03 (×3): 5 mg via ORAL
  Filled 2024-04-02 (×3): qty 1

## 2024-04-02 MED ORDER — SODIUM CHLORIDE 0.9 % IV SOLN: 500.0000 mg | Freq: Once | INTRAVENOUS | Status: DC

## 2024-04-02 MED ORDER — TRAMADOL HCL 50 MG PO TABS
50.0000 mg | ORAL_TABLET | Freq: Two times a day (BID) | ORAL | Status: DC | PRN
  Administered 2024-04-03 – 2024-04-07 (×7): 100 mg via ORAL
  Filled 2024-04-02 (×7): qty 2

## 2024-04-02 MED ORDER — FUROSEMIDE 40 MG PO TABS
40.0000 mg | ORAL_TABLET | Freq: Every day | ORAL | Status: DC
  Administered 2024-04-03 – 2024-04-04 (×2): 40 mg via ORAL
  Filled 2024-04-02 (×2): qty 1

## 2024-04-02 MED ORDER — BUDESON-GLYCOPYRROL-FORMOTEROL 160-9-4.8 MCG/ACT IN AERO
2.0000 | INHALATION_SPRAY | Freq: Two times a day (BID) | RESPIRATORY_TRACT | Status: DC
  Administered 2024-04-03 – 2024-04-10 (×14): 2 via RESPIRATORY_TRACT
  Filled 2024-04-02: qty 5.9

## 2024-04-02 MED ORDER — GUAIFENESIN-DM 100-10 MG/5ML PO SYRP
10.0000 mL | ORAL_SOLUTION | ORAL | Status: DC | PRN
  Administered 2024-04-02: 10 mL via ORAL
  Filled 2024-04-02: qty 10

## 2024-04-02 MED ORDER — MONTELUKAST SODIUM 10 MG PO TABS
10.0000 mg | ORAL_TABLET | Freq: Every day | ORAL | Status: DC
  Administered 2024-04-02 – 2024-04-09 (×8): 10 mg via ORAL
  Filled 2024-04-02 (×8): qty 1

## 2024-04-02 MED ORDER — ACETAMINOPHEN 325 MG PO TABS
650.0000 mg | ORAL_TABLET | Freq: Four times a day (QID) | ORAL | Status: DC | PRN
  Administered 2024-04-07 (×2): 650 mg via ORAL
  Filled 2024-04-02 (×3): qty 2

## 2024-04-02 NOTE — ED Triage Notes (Signed)
 Pt brought from home for general malaise. Pt currently being treated for pneumonia. Pt had a fall yesterday and was seen at another facility. Pt states that she is feeling very weak today and was advised to come to the ED for further evaluation.

## 2024-04-02 NOTE — Plan of Care (Signed)

## 2024-04-02 NOTE — ED Provider Notes (Signed)
 [place] EMERGENCY DEPARTMENT AT [facility] Provider Note   CSN: [id number] Arrival date & time: 04/02/24  9051     Patient presents with: No chief complaint on file.   [name] is a 78 y.o. female.   The history is provided by the patient and medical records. No language interpreter was used.  Illness Location:  Generalized malaise, fatigue, difficulty ambulating, falls, increasing oxygen requirement Severity:  Severe Onset quality:  Gradual Duration:  3 days Timing:  Constant Progression:  Worsening Chronicity:  New Associated symptoms: cough, fatigue and shortness of breath (some)   Associated symptoms: no abdominal pain, no chest pain, no congestion, no diarrhea, no fever, no headaches, no loss of consciousness, no nausea, no rash, no rhinorrhea, no vomiting and no wheezing        Prior to Admission medications   Medication Sig Start Date End Date Taking? Authorizing Provider  acetaminophen  (TYLENOL ) 500 MG tablet Take 2 tablets (1,000 mg total) by mouth in the morning and at bedtime. 02/05/24   [name], MD  acidophilus (RISAQUAD) CAPS capsule Take 1 capsule by mouth daily.    [provider]  albuterol  (VENTOLIN  HFA) 108 (90 Base) MCG/ACT inhaler Inhale 2 puffs into the lungs every 6 (six) hours as needed for wheezing. 06/05/23   Geronimo Amel, MD  amoxicillin -clavulanate (AUGMENTIN ) 875-125 MG tablet Take 1 tablet by mouth 2 (two) times daily for 7 days. 03/29/24 04/05/24  [name], MD  ascorbic acid  (VITAMIN C) 500 MG tablet Take 1 tablet (500 mg total) by mouth daily. 10/05/22   [name], MD  Biotin 1000 MCG tablet Take 1,000 mcg by mouth daily.    [provider]  buPROPion  (WELLBUTRIN  SR) 150 MG 12 hr tablet TAKE 1 TABLET(150 MG) BY MOUTH EVERY MORNING 03/07/24   [name], MD  calcium  carbonate (OSCAL) 1500 (600 Ca) MG TABS tablet Take 600 mg of elemental calcium  by mouth daily.     [provider]  carboxymethylcellulose (REFRESH PLUS) 0.5 % SOLN Place 1 drop into both eyes 3 (three) times daily as needed (dry eyes).    [provider]  cephALEXin  (KEFLEX ) 250 MG/5ML suspension Take 5 mLs (250 mg total) by mouth daily. For UTI ppx 03/29/24   [name], MD  cholecalciferol (VITAMIN D3) 25 MCG (1000 UNIT) tablet Take 1,000 Units by mouth daily.    [provider]  docusate sodium  (COLACE) 100 MG capsule Take 200 mg by mouth daily.    [provider]  ELIQUIS  5 MG TABS tablet TAKE 1 TABLET(5 MG) BY MOUTH TWICE DAILY 03/25/24   Croitoru, Mihai, MD  Ensifentrine  (OHTUVAYRE ) 3 MG/2.5ML SUSP Inhale 1 Inhalation into the lungs in the morning and at bedtime. Twice daily via neb    [provider]  ferrous sulfate  325 (65 FE) MG EC tablet Take 1 tablet (325 mg total) by mouth daily with breakfast. 09/06/23   Cloretta Arley NOVAK, MD  Fluticasone -Umeclidin-Vilant (TRELEGY ELLIPTA ) 200-62.5-25 MCG/ACT AEPB Inhale 1 puff into the lungs daily. 06/05/23   Geronimo Amel, MD  furosemide  (LASIX ) 40 MG tablet TAKE 1 TABLET BY MOUTH EVERY DAY. MAY TAKE AN EXTRA TABLET AS NEEDED FOR SWELLING Patient taking differently: Take 40 mg by mouth in the morning. 08/24/23   Croitoru, Mihai, MD  gabapentin  (NEURONTIN ) 100 MG capsule Take 1 capsule (100 mg total) by mouth 2 (two) times daily as needed (back pain - for daytime use). 04/24/23   [name], MD  gabapentin  (NEURONTIN ) 300 MG capsule Take 300 mg by mouth at bedtime.    [provider]  hydrocortisone  (ANUSOL -HC) 2.5 % rectal cream APPLY RECTALLY TO THE AFFECTED AREA TWICE DAILY 12/27/23   [name], MD  lisinopril  (ZESTRIL ) 5 MG tablet Take 5 mg by mouth daily.    [provider]  lovastatin  (MEVACOR ) 40 MG tablet TAKE 1 TABLET(40 MG) BY MOUTH AT BEDTIME 10/23/23   [name], MD  metoprolol  succinate (TOPROL  XL) 25 MG 24 hr tablet Take 1 tablet (25 mg total) by  mouth every evening. 09/11/23   Aniceto Daphne CROME, NP  montelukast  (SINGULAIR ) 10 MG tablet TAKE 1 TABLET(10 MG) BY MOUTH AT BEDTIME 03/25/24   Geronimo Amel, MD  Multiple Vitamins-Minerals (PRESERVISION AREDS 2 PO) Take 2 capsules by mouth in the morning.    [provider]  ondansetron  (ZOFRAN ) 4 MG tablet Take 1 tablet (4 mg total) by mouth every 8 (eight) hours as needed for nausea or vomiting. 03/14/24   [name], MD  Phenazopyrid-Cranbry-C-Probiot (AZO URINARY TRACT SUPPORT PO) Take 1 capsule by mouth daily.    [provider]  polyethylene glycol (MIRALAX  / GLYCOLAX ) packet Take 17 g by mouth daily. Patient taking differently: Take 17 g by mouth daily as needed for mild constipation. 12/02/13   Maryjo Dorise ORN, PA-C  potassium chloride  (KLOR-CON ) 10 MEQ tablet TAKE 2 TABLETS(20 MEQ) BY MOUTH DAILY 02/09/24   Croitoru, Jerel, MD  promethazine  (PHENERGAN ) 12.5 MG tablet Take 1 tablet (12.5 mg total) by mouth every 8 (eight) hours as needed for nausea or vomiting. 11/29/23   [name], MD  Spacer/Aero-Holding Chambers (PRO COMFORT SPACER ADULT) MISC To use with inhalers 03/29/24   [name], MD  traMADol  (ULTRAM ) 50 MG tablet Take 1-2 tablets (50-100 mg total) by mouth 2 (two) times daily as needed for moderate pain (pain score 4-6). 02/06/24   [name], MD  traZODone  (DESYREL ) 50 MG tablet Take 1 tablet (50 mg total) by mouth at bedtime. 03/29/24   [name], MD  Vibegron  (GEMTESA ) 75 MG TABS Take 75 mg by mouth daily.    [provider]  vitamin B-12 (CYANOCOBALAMIN ) 500 MCG tablet Take 500 mcg by mouth 2 (two) times a week.    [provider]  Zinc  50 MG CAPS Take 1 capsule (50 mg total) by mouth daily. 11/04/19   [name], MD    Allergies: Tape, Codeine, Prednisone , and Sulfa antibiotics    Review of Systems  Constitutional:  Positive for fatigue. Negative for chills and fever.  HENT:  Negative for congestion and  rhinorrhea.   Eyes:  Negative for visual disturbance.  Respiratory:  Positive for cough and shortness of breath (some). Negative for chest tightness and wheezing.   Cardiovascular:  Positive for leg swelling. Negative for chest pain and palpitations.  Gastrointestinal:  Negative for abdominal pain, constipation, diarrhea, nausea and vomiting.  Genitourinary:  Negative for dysuria.  Musculoskeletal:  Negative for back pain, neck pain and neck stiffness.  Skin:  Positive for wound (multiple skin tears). Negative for rash.  Neurological:  Negative for loss of consciousness, weakness, light-headedness, numbness and headaches.  Psychiatric/Behavioral:  Negative for agitation and confusion.   All other systems reviewed and are negative.   Updated Vital Signs There were no vitals taken for this visit.  Physical Exam Vitals and nursing note reviewed.  Constitutional:      General: She is not in acute distress.    Appearance: She is  well-developed. She is not ill-appearing, toxic-appearing or diaphoretic.  HENT:     Head: Normocephalic and atraumatic.     Right Ear: External ear normal.     Left Ear: External ear normal.     Nose: Nose normal. No congestion or rhinorrhea.     Mouth/Throat:     Mouth: Mucous membranes are moist.     Pharynx: No oropharyngeal exudate or posterior oropharyngeal erythema.   Eyes:     Conjunctiva/sclera: Conjunctivae normal.     Pupils: Pupils are equal, round, and reactive to light.    Cardiovascular:     Rate and Rhythm: Normal rate.     Heart sounds: Murmur heard.  Pulmonary:     Effort: No respiratory distress.     Breath sounds: No stridor. Wheezing, rhonchi and rales present.  Chest:     Chest wall: No tenderness.  Abdominal:     General: There is no distension.     Tenderness: There is no abdominal tenderness. There is no right CVA tenderness, left CVA tenderness, guarding or rebound.   Musculoskeletal:        General: Tenderness present.      Cervical back: Normal range of motion and neck supple. No tenderness.     Comments: Multiple skin tears on extremities.  Large skin tears going down her right lateral arm with oozing bleeding.  No deep laceration seen.  Intact sensation, strength, pulses distally in arm.  Bandage skin tears on both shins.  Intact sensation on strength, pulse distally.  Edema in both legs otherwise.  Diffuse bruising from multiple falls.   Skin:    General: Skin is warm.     Findings: Bruising present. No erythema or rash.   Neurological:     Mental Status: She is alert and oriented to person, place, and time.     Sensory: No sensory deficit.     Motor: No weakness or abnormal muscle tone.     Deep Tendon Reflexes: Reflexes are normal and symmetric.   Psychiatric:        Mood and Affect: Mood normal.     (all labs ordered are listed, but only abnormal results are displayed) Labs Reviewed  CBC WITH DIFFERENTIAL/PLATELET - Abnormal; Notable for the following components:      Result Value   WBC 11.5 (*)    RBC 3.43 (*)    MCV 111.4 (*)    MCH 35.9 (*)    Platelets 81 (*)    Neutro Abs 10.3 (*)    Lymphs Abs 0.2 (*)    All other components within normal limits  COMPREHENSIVE METABOLIC PANEL WITH GFR - Abnormal; Notable for the following components:   Chloride 93 (*)    CO2 36 (*)    Glucose, Bld 114 (*)    BUN 31 (*)    Creatinine, Ser 1.12 (*)    Albumin  2.8 (*)    GFR, Estimated 50 (*)    All other components within normal limits  BRAIN NATRIURETIC PEPTIDE - Abnormal; Notable for the following components:   B Natriuretic Peptide 365.5 (*)    All other components within normal limits  URINALYSIS, W/ REFLEX TO CULTURE (INFECTION SUSPECTED) - Abnormal; Notable for the following components:   APPearance HAZY (*)    Ketones, ur 5 (*)    Protein, ur 30 (*)    Leukocytes,Ua LARGE (*)    Bacteria, UA RARE (*)    Non Squamous Epithelial 6-10 (*)    All other  components within normal limits   TROPONIN I (HIGH SENSITIVITY) - Abnormal; Notable for the following components:   Troponin I (High Sensitivity) 86 (*)    All other components within normal limits  MRSA NEXT GEN BY PCR, NASAL  CULTURE, BLOOD (ROUTINE X 2)  CULTURE, BLOOD (ROUTINE X 2)  URINE CULTURE  TSH  AMMONIA  MAGNESIUM   TROPONIN I (HIGH SENSITIVITY)    EKG: EKG Interpretation Date/Time:  Tuesday April 02 2024 10:05:38 EDT Ventricular Rate:  74 PR Interval:  190 QRS Duration:  203 QT Interval:  497 QTC Calculation: 552 R Axis:   -81  Text Interpretation: Paced rhythm  Left atrial enlargement LVH with IVCD, LAD and secondary repol abnrm when compared to prior, similar appearance of paced rhythm NO STEMI Confirmed by Fleeta Barefoot ([id number]) on 04/02/2024 10:54:17 AM  Radiology: [facility] Chest 2 View Result Date: 04/02/2024 CLINICAL DATA:  recent PNA and CHF, worsening oxygen requirement, cough, SOB, wheeze, ronchi and large rales, peripheral edema. EXAM: CHEST - 2 VIEW COMPARISON:  03/21/2024. FINDINGS: Re-demonstration of left retrocardiac airspace opacity obscuring the left hemidiaphragm, descending thoracic aorta and blunting the left lateral costophrenic angle, suggesting combination of left lung atelectasis and/or consolidation with pleural effusion. No significant interval change. There are new heterogeneous opacities overlying the right mid lower lung zones without volume loss, compatible with pneumonia. There is trace right pleural effusion, grossly unchanged since the prior study. Redemonstration of right apical pleural cap, unchanged. Redemonstration of chronic interstitial predominance, similar to the prior study. No frank pulmonary edema. Stable cardio-mediastinal silhouette. There is a left sided 2-lead pacemaker. 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 No acute osseous abnormalities. The  soft tissues are within normal limits. IMPRESSION: 1. New heterogeneous opacities overlying the right mid lower lung zones, compatible with pneumonia. 2. Stable left retrocardiac airspace opacity, suggesting combination of left lung atelectasis and/or consolidation with pleural effusion. 3. Stable trace right pleural effusion. Electronically Signed   By: Ree Molt [name]   On: 04/02/2024 12:45   CT Head Wo Contrast Result Date: 04/02/2024 CLINICAL DATA:  Head trauma, minor (Age >= 65y) Head trauma, moderate-severe fall on thinners, sleepy EXAM: CT HEAD WITHOUT CONTRAST TECHNIQUE: Contiguous axial images were obtained from the base of the skull through the vertex without intravenous contrast. RADIATION DOSE REDUCTION: This exam was performed according to the departmental dose-optimization program which includes automated exposure control, adjustment of the mA and/or kV according to patient size and/or use of iterative reconstruction technique. COMPARISON:  CT of the head dated October 09, 2022. FINDINGS: Brain: Chronic lacunar infarct within the left caudate nucleus. Mild periventricular white matter disease. No evidence of hemorrhage, mass, acute cortical infarct or hydrocephalus. Vascular: Moderate calcific atheromatous disease within the carotid siphons and vertebral arteries. Skull: Intact and unremarkable. Sinuses/Orbits: Status post bilateral lens replacement. Paranasal sinuses are clear. Other: Negative. IMPRESSION: 1. No evidence of acute traumatic injury. 2. Stable chronic ischemic small vessel disease. Electronically Signed   By: Evalene Coho [name]   On: 04/02/2024 12:25     Procedures   CRITICAL CARE Performed by: Lonni PARAS Shamara Soza Total critical care time: 35 minutes Critical care time was exclusive of separately billable procedures and treating other patients. Critical care was necessary to treat or prevent imminent or life-threatening deterioration. Critical care was time spent  personally by me on the following activities: development of treatment plan with  patient and/or surrogate as well as nursing, discussions with consultants, evaluation of patient's response to treatment, examination of patient, obtaining history from patient or surrogate, ordering and performing treatments and interventions, ordering and review of laboratory studies, ordering and review of radiographic studies, pulse oximetry and re-evaluation of patient's condition.   Medications Ordered in the ED  methylPREDNISolone  sodium succinate (SOLU-MEDROL ) 40 mg/mL injection 40 mg (has no administration in time range)  traMADol  (ULTRAM ) tablet 50-100 mg (has no administration in time range)  furosemide  (LASIX ) tablet 40 mg (has no administration in time range)  metoprolol  succinate (TOPROL -XL) 24 hr tablet 25 mg (has no administration in time range)  pravastatin  (PRAVACHOL ) tablet 40 mg (has no administration in time range)  buPROPion  (WELLBUTRIN  SR) 12 hr tablet 150 mg (has no administration in time range)  traZODone  (DESYREL ) tablet 50 mg (has no administration in time range)  montelukast  (SINGULAIR ) tablet 10 mg (has no administration in time range)  budesonide -glycopyrrolate -formoterol  (BREZTRI ) 160-9-4.8 MCG/ACT inhaler 2 puff (has no administration in time range)  acetaminophen  (TYLENOL ) tablet 650 mg (has no administration in time range)    Or  acetaminophen  (TYLENOL ) suppository 650 mg (has no administration in time range)  traZODone  (DESYREL ) tablet 25 mg (has no administration in time range)  ondansetron  (ZOFRAN ) tablet 4 mg (has no administration in time range)    Or  ondansetron  (ZOFRAN ) injection 4 mg (has no administration in time range)  albuterol  (PROVENTIL ) (2.5 MG/3ML) 0.083% nebulizer solution 2.5 mg (has no administration in time range)  ceFEPIme (MAXIPIME) 2 g in sodium chloride  0.9 % 100 mL IVPB (has no administration in time range)  Tdap (BOOSTRIX) injection 0.5 mL (0.5 mLs  Intramuscular Given 04/02/24 1141)                                    Medical Decision Making Amount and/or Complexity of Data Reviewed Labs: ordered. Radiology: ordered.  Risk Prescription drug management. Decision regarding hospitalization.    Sherisa Jermeka Schlotterbeck is a 78 y.o. female with a past medical history significant for hypertension, hyperlipidemia, COPD, GERD, CHF, A-fib on Eliquis  therapy, carotid disease, kidney stones, previous colon cancer, CKD, and recent admission for fluid overload and pneumonia now on 2 L home oxygen who presents with increased ox requirement, fatigue, somnolence, fall, skin tears, continued cough, and confusion.  According to patient and family, she was just discharged from the hospital recently for admission for fluid overload and pneumonia and was sent home on 2 L.  Therefore that yesterday the patient's oxygen saturation was about 90% on 2 L that was up to 3 and it increased and then this morning increased it to 4 when it was again back down to 90.  Patient was having some confusion and disorientation and has had multiple falls.  She is unsure if she hit her head but is been reportedly somnolent and sleepy and sleeping much harder than normal.  She was not complaining of significant pain but is having persistent cough.  She is having general malaise and fatigue but is denying fevers or chills.  She denies nausea, vomiting, constipation, diarrhea, or urinary changes.  She does think her legs may be persistently swollen.  She has multiple skin tears on her right forearm and both legs after fall yesterday and dropped her laptop on her legs.  She had the wounds managed by her family.  Patient talked to her doctor who  told her to come in for IV antibiotics and likely admission for worsening oxygen requirement and symptoms.  On my exam, lungs are extremely coarse with rales, rhonchi, and some wheezing.  Chest was nontender.  There was a murmur.  Abdomen nontender.   She has edema in both legs and has wrapped skin tear with bandages.  Her right arm did have a large skin tear going down large portion of her right lateral arm with no deep laceration seen.  It will be cleaned and dressed and bandaged and we will try to redress the leg once as well.  Distally she had pulses and strength.  Abdomen was nontender with good bowel sounds.  Back nontender.  No focal neurologic deficits initially pupils are symmetric and reactive with normal extraocular movements.  Clinically I am concerned that the patient's ox requirement.  She is now on 4 L as she did drop to 89% while talking with me on 3 L which is higher than she was discharged with.  Will get x-rays, labs, and workup to look for concerning findings but anticipate she will need admission for likely further management of respiratory difficulty and somnolence and altered mental status.  EKG showed paced rhythm similar to prior.  Tetanus shot will be updated.  1:42 PM Workup reveals leukocytosis and evidence of new opacities and areas of pneumonia.  CT head did not show acute intracranial hemorrhage.  Troponin slightly higher than prior, will trend.  BNP improved from prior.  Given the patient's symptoms, fatigue, and worsening oxygen current, will give antibiotics and call for admission.      Final diagnoses:  Pneumonia due to infectious organism, unspecified laterality, unspecified part of lung   Clinical Impression: 1. Pneumonia due to infectious organism, unspecified laterality, unspecified part of lung     Disposition: Admit  This note was prepared with assistance of Dragon voice recognition software. Occasional wrong-word or sound-a-like substitutions may have occurred due to the inherent limitations of voice recognition software.     Madysun Thall, Lonni PARAS, MD 04/02/24 [phone]

## 2024-04-02 NOTE — Consult Note (Signed)
 WOC Nurse Consult Note: Reason for Consult: wounds from fall and pressure wounds  Wound type: 1. Full thickness R arm and B lower extremities from fall  2.  Deep Tissue Pressure Injury buttocks  Pressure Injury POA: Yes Measurement: see nursing flowsheet  Wound bed: arm described as a skin tear by admitting MD with no deep laceration, B lower legs red moist Buttocks with purple maroon discoloration, some purple eschar noted  Drainage (amount, consistency, odor) serosanguinous  Periwound: ecchymosis arm and legs Dressing procedure/placement/frequency:  Cleanse B buttocks with soap and water , dry and apply Xeroform gauze ([place] [phone]) to purple maroon discoloration daily, secure with silicone foam.  Cleanse R arm skin tear with NS, apply Vaseline gauze Soila [phone]) to wound bed daily, cover with Telfa nonstick dressing and secure with Kerlix roll gauze.  MOISTEN DRESSING WITH NS IF ADHERED TO WOUND BED FOR ATRAUMATIC REMOVAL.  Cleanse B lower leg skin tears with NS, place Xeroform gauze Soila [phone]) to wound beds daily, cover with Telfa nonstick dressing and secure with Kerlix roll gauze.   POC discussed with bedside nurse. WOC team will not follow. Re-consult if further needs arise.   Thank you,    Powell Bar MSN, RN-BC, [employer]

## 2024-04-02 NOTE — Transitions of Care (Post Inpatient/ED Visit) (Unsigned)
   04/02/2024  Name: [name] MRN: [id number] DOB: 20-Oct-1945  Scheduled appointment today:    Patient currently in ED    Richerd Fish, RN, BSN, CCM [place]  [facility], [facility] RN Care Manager Direct Dial: [phone]

## 2024-04-02 NOTE — H&P (Signed)
 History and Physical  [name] FMW:[id number] DOB: 02-04-1946 DOA: 04/02/2024  PCP: Diane Baller, MD   Chief Complaint: Cough, weakness, fall at home  HPI: [name] is a 78 y.o. female with medical history significant for atrial fibrillation on Eliquis , anxiety, chronic diastolic congestive heart failure and COPD chronically on room air who was recently discharged from [facility] on 6/13 after a stay for community-acquired pneumonia and combined COPD exacerbation being admitted to the hospital with recurrent acute hypoxic respiratory failure with worsening pneumonia.  History is provided by the patient as well as her granddaughter who is at the bedside, they state that she was discharged home on 6/13 on 2 L nasal cannula oxygen.  She completed a course of oral antibiotics after discharge.  She was doing well at home, had remained on supplemental oxygen.  However over the last 2 to 3 days, patient and family noticed that she developed more of a wet cough, felt more dyspnea with exertion, weakness and unsteadiness on her feet.  She fell at home yesterday and tore up the skin on her right arm.  Review of Systems: Please see HPI for pertinent positives and negatives. A complete 10 system review of systems are otherwise negative.  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 Known DISH with thoracic spine fusion and moderate kyphosis, h/o L spine fusion L2-5   Adjustment disorder with depressed mood 2014/10/23   55 yo son died MI 10-16-14 Husband died car accident 04/17/2015   Anemia    Anticoagulant long-term use 06/14/2019   She takes warfarin for afib/flutter with CHADS2VASc score of 4 (age, sex, CHF, HTN), s/p 2 bioprosthetic valve replacements Coumadin   changed to eliquis  Apr 16, 2021 per cardiology   Aortic atherosclerosis 03/03/2022   Noted on 01/2022 chest CT   Arthritis    Arthropathy of spinal facet joint 03/08/2018   Ascending aortic aneurysm 03/02/2021   4.1cm on CT 01/2021 rec yearly monitoring   Bilateral hip pain 09/08/2016   Cancer (HCC)    conon cancer 2011-04-17   CAP (community acquired pneumonia) 02/08/2016   Carotid stenosis 12/08/2020   Carotid US  - 40-59% BICA stenosis (2015) Carotid US  12/2020 - 1-39% BICA stenosis, incidental 3.4cm structure behind L common carotid artery rec further imaging    Cataracts, bilateral    immature   Chest pain 08/25/2014   myoview  low risk 02/2018 ([place]itz)   Chronic cholecystitis with calculus 01/21/2015   S/p cholecystectomy    Chronic diastolic heart failure 09/19/2014   Chronic insomnia    Chronic lower back pain 03/05/2008   Returned to Dr Colon - translaminar ESI at L3/4. Myelogram showed significant left sided foraminal stenosis at L2/3 and L3/4 in setting of solid arthrodesis, planned DEXA and if stable osteopenia, considering anterolateral decompression with spacer.    CKD (chronic kidney disease) stage 2, GFR 60-89 ml/min 07/03/2015   Constipation    takes Miralax  daily as needed   COPD (chronic obstructive pulmonary disease)    Albuterol  inhaler daily as needed;Duoneb daily as needed;Spiriva  daily   COVID-19 virus infection 11/11/2022   DDD (degenerative disc disease)    cervical - kyphosis with mod DD changes C5/6 and C6/7; lumbar - early DD at L2/3 (Elsner)   Depression    DISH (diffuse idiopathic skeletal hyperostosis) 05/14/2019  By MRI noted fusion of lower thoracic vertebrae - likely contributing to unsteadiness (Elsner)   Dyspnea on exertion 06/05/2015   myoview  low risk 02/2018   Edema of both lower legs due to peripheral venous insufficiency    Essential hypertension, benign 03/24/2009   hx of not on nmeds currenlty and has been a while since on meds per pt   Heart murmur     Hematuria 06/17/2021   History of kidney stones    History of radiation therapy 05/30/11 to 07/07/11   rectum   History of rectal cancer 05/26/2011   Rectal cancer 2012, midrectum [id number], s/p neoadj chemo&XRT, lap LAR with diverting loop ileostomy 08/2011, ileostomy takedown 03/2012 Discharged from oncology clinic 2017 Marquita)   History of shingles    History of vertebral fracture 11/26/2019   Old L1 vertebral compression fracture incidentally noted on imaging 2020    Hyperlipemia    takes Lovastatin  daily   Lacunar infarction    Left caudate   Left hip pain 07/29/2020   Legally blind in left eye    Lymphangioma 01/07/2021   Noted incidentally on carotid US  12/2020 CT neck - Likely benign lymphangioma 01/2021   Macrocytosis 03/27/2022   Folate and b12 checked 2023   Malaise and fatigue 01/22/2018   Meralgia paresthetica 12/13/2007   Mixed stress and urge urinary incontinence 03/16/2015   Failed oxybutynin  IR/ER. Vesicare  initially helped but then no longer helpful.  myrbetriq  was not effective.  Saw urology (MacDiarmid) - UDS largely overactive bladder with mild-mod stress incontinence. Improved on abx course - maintained on macrodantin  and toviaz .    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   Pulmonary infiltrates 04/23/2019   06/13/2018-CT super D chest without contrast- generally stable chronic lung disease with pleural parenchymal scarring and scattered nodularity most of these nodules have been tree-in-bud in appearance likely postinfectious or inflammatory the dominant right lower lobe nodule seen on most recent study has resolved no new or enlarging nodules  04/22/2019-CT chest without contrast- waxing and waning per   Pulmonary nodule 02/23/2012   RLL nodule-21mm stable 2006, April 2009, and June 2009 Small pulm nodules Jan 2012 - > Oct 2012 without change   Rheumatic heart disease mitral stenosis    mod MS by echo 02/2014   Right leg swelling 06/11/2020   R venous US  06/2020 WNL: - No evidence of deep vein thrombosis in the lower extremity. No indirect evidence of obstruction proximal to the inguinal ligament. - No cystic structure found in the popliteal fossa.   S/P aortic and mitral valve bioprostheses 12/08/2013   AVR 21 mm, MVR  25 mm-MagnaEase pericardia   S/P AVR (aortic valve replacement) 2015   bioprosthetic (Bartle)   S/P MVR (mitral valve replacement) 2015   bioprosthetic (Bartle)   Sepsis secondary to UTI (HCC) 08/22/2017   Severe aortic valve stenosis 10/01/2013   mild-mod by echo 02/2014   Skin lesion 01/03/2022   Small bowel obstruction, partial 08/2013   reolved without NGT placement.    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   Dr Amy [place]   Syncope 05/28/2018   Thrombocytopenia 11/04/2019   Typical atrial flutter 05/15/2019   Vitamin B12 deficiency 02/28/2015   Start B12 shots 02/2015    Vitamin D  deficiency 10/23/2016   Wounds, multiple    bilateral legs   Past Surgical History:  Procedure Laterality Date    ANTERIOR LAT LUMBAR FUSION Left 07/17/2018   Procedure: Lumbar Two-Three Lumbar Three-Four Anterolateral decompression/interbody fusion with lateral plate fixation/Infuse;  Surgeon: Colon Shove, MD;  Location: [facility] OR;  Service: Neurosurgery;  Laterality: Left;  Lumbar Two-Three Lumbar Three-Four Anterolateral decompression/interbody fusion with lateral plate fixation/Infuse   AORTIC VALVE REPLACEMENT N/A 12/12/2013   Procedure: AORTIC VALVE REPLACEMENT (AVR);  Surgeon: Dorise MARLA Fellers, MD; Service: Open Heart Surgery   BACK SURGERY  2006, 2007   2006 SPACER, 2007 decompression and fusion L4/5   BOWEL RESECTION  04/02/2012   Procedure: SMALL BOWEL RESECTION;  Surgeon: Jina Nephew, MD;  Location: [facility] ORS;  Service: General;  Laterality: N/A;   BRONCHIAL WASHINGS  03/24/2021   Procedure: BRONCHIAL WASHINGS;  Surgeon: Geronimo Amel, MD;  Location: [facility] ENDOSCOPY;  Service: Endoscopy;;   CARDIOVERSION N/A 08/29/2023   Procedure: CARDIOVERSION (CATH LAB);  Surgeon: Pietro Redell RAMAN, MD;  Location: [facility] INVASIVE CV LAB;  Service: Cardiovascular;  Laterality: N/A;   CARPAL TUNNEL RELEASE Bilateral    CATARACT EXTRACTION W/ INTRAOCULAR LENS IMPLANT Left 10/18/2016   CATARACT EXTRACTION W/ INTRAOCULAR LENS IMPLANT Right 12/13/2016   Dr. Rosan   CHOLECYSTECTOMY N/A 01/21/2015   chronic cholecystitis, Jina Nephew, MD   COLON RESECTION  08/25/2011   Procedure: COLON RESECTION LAPAROSCOPIC;  Surgeon: Jina Nephew, MD;  Location: [facility];  Service: General;  Laterality: N/A;  Laparoscopic Assisted Low Anterior Resection Diverting Ostomy and onQ pain pump   COLONOSCOPY  03/2013   1 polyp, rpt 3 yrs Marianne)   COLONOSCOPY  05/2021   diverticulosis and patent colon anastomosis with some edema/narrowing, rpt 5 yrs Marianne)   COLONOSCOPY WITH PROPOFOL  N/A 06/09/2016   patent colo-colonic anastomosis, rpt 5 yrs Marianne)   COLONOSCOPY WITH PROPOFOL  N/A 01/25/2024   TA, fair prep (consider repeat if  needed), patent rectal anastomosis with congestion edema and stenosis, mild sigmoid diverticulosis Zane Groom, MD)   CYSTOSCOPY/URETEROSCOPY/HOLMIUM LASER/STENT PLACEMENT Right 07/03/2023   Procedure: CYSTOSCOPY RIGHT RETROGRADE PYEPLGRAM RIGHT URETEROSCOPY/HOLMIUM LASER/STENT PLACEMENT;  Surgeon: Carolee Sherwood JONETTA DOUGLAS, MD;  Location: [facility];  Service: Urology;  Laterality: Right;  1 HR FOR CASE   CYSTOSCOPY/URETEROSCOPY/HOLMIUM LASER/STENT PLACEMENT Right 07/17/2023   Procedure: CYSTOSCOPY/RIGHT URETEROSCOPY/STENT EXCHANGE;  Surgeon: Carolee Sherwood JONETTA DOUGLAS, MD;  Location: [facility];  Service: Urology;  Laterality: Right;  60 MINS FOR CASE   EP IMPLANTABLE DEVICE N/A 06/16/2016   Procedure: Pacemaker Implant;  Surgeon: Will Gladis Norton, MD;  Location: [facility] INVASIVE CV LAB;  Service: Cardiovascular;  Laterality: N/A;   ESOPHAGOGASTRODUODENOSCOPY (EGD) WITH PROPOFOL  N/A 01/25/2024   diffuse gastritis benign biopsy Zane Groom, MD)   FOOT SURGERY  left foot   hammer toe  ILEOSTOMY  08/25/2011   ILEOSTOMY CLOSURE  04/02/2012   Procedure: ILEOSTOMY TAKEDOWN;  Surgeon: Jina Nephew, MD;  Location: [facility];  Service: General;  Laterality: N/A;   INTRAOPERATIVE TRANSESOPHAGEAL ECHOCARDIOGRAM N/A 12/12/2013   Procedure: INTRAOPERATIVE TRANSESOPHAGEAL ECHOCARDIOGRAM;  Surgeon: Dorise MARLA Fellers, MD;  Location: [facility] OR;  Service: Open Heart Surgery;  Laterality: N/A;   LEFT AND RIGHT HEART CATHETERIZATION WITH CORONARY ANGIOGRAM N/A 11/27/2013   Procedure: LEFT AND RIGHT HEART CATHETERIZATION WITH CORONARY ANGIOGRAM;  Surgeon: [name], MD;  Location: [facility] CATH LAB;  Service: Cardiovascular;  Laterality: N/A;   MITRAL VALVE REPLACEMENT N/A 12/12/2013   Procedure: MITRAL VALVE (MV) REPLACEMENT OR REPAIR;  Surgeon: Dorise MARLA Fellers, MD; Service: Open Heart Surgery   POLYPECTOMY  01/25/2024   Procedure: POLYPECTOMY, INTESTINE;  Surgeon: Charlanne Groom, MD;  Location: [facility] ENDOS[facility];  Service: Gastroenterology;;    TEE WITHOUT CARDIOVERSION N/A 11/29/2013   Procedure: TRANSESOPHAGEAL ECHOCARDIOGRAM (TEE);  Surgeon: Wilbert JONELLE Bihari, MD;  Location: [facility] ENDOSCOPY;  Service: Cardiovascular;  Laterality: N/A;   TEE WITHOUT CARDIOVERSION N/A 06/04/2019   Procedure: TRANSESOPHAGEAL ECHOCARDIOGRAM (TEE);  Surgeon: Balding Jerel, MD;  Location: [facility] ENDOSCOPY;  Service: Cardiovascular;  Laterality: N/A;   TONSILLECTOMY     TRANSESOPHAGEAL ECHOCARDIOGRAM (CATH LAB) N/A 08/29/2023   Procedure: TRANSESOPHAGEAL ECHOCARDIOGRAM;  Surgeon: Pietro Redell RAMAN, MD;  Location: [facility] INVASIVE CV LAB;  Service: Cardiovascular;  Laterality: N/A;   VIDEO BRONCHOSCOPY N/A 03/24/2021   Procedure: VIDEO BRONCHOSCOPY WITHOUT FLUORO;  Surgeon: Geronimo Amel, MD;  Location: [facility] ENDOSCOPY;  Service: Endoscopy;  Laterality: N/A;  SMALL SCOPE,PREMEDICATE WITH NEBULIZED LIDOCAINE    Social History:  reports that she quit smoking about 49 years ago. Her smoking use included cigarettes. She started smoking about 59 years ago. She has a 15 pack-year smoking history. She has never used smokeless tobacco. She reports that she does not drink alcohol  and does not use drugs.  Allergies  Allergen Reactions   Tape Other (See Comments)    THICK TAPES PULLS OFF MY SKIN   Codeine Nausea Only   Prednisone  Nausea Only   Sulfa Antibiotics Nausea Only    Family History  Problem Relation Age of Onset   Heart disease Mother    Diabetes Mother    Hypertension Mother    Stroke Mother    Lung cancer Father    Breast cancer Maternal Aunt 71   Heart attack Maternal Grandfather    CAD Son 81   Heart attack Son 78   Rectal cancer Neg Hx    Stomach cancer Neg Hx    Esophageal cancer Neg Hx    Colon cancer Neg Hx      Prior to Admission medications   Medication Sig Start Date End Date Taking? Authorizing Provider  acetaminophen  (TYLENOL ) 500 MG tablet Take 2 tablets (1,000 mg total) by mouth in the morning and at bedtime. 02/05/24   Diane Baller, MD  acidophilus (RISAQUAD) CAPS capsule Take 1 capsule by mouth daily.    [provider]  albuterol  (VENTOLIN  HFA) 108 (90 Base) MCG/ACT inhaler Inhale 2 puffs into the lungs every 6 (six) hours as needed for wheezing. 06/05/23   Geronimo Amel, MD  amoxicillin -clavulanate (AUGMENTIN ) 875-125 MG tablet Take 1 tablet by mouth 2 (two) times daily for 7 days. 03/29/24 04/05/24  Diane Baller, MD  ascorbic acid  (VITAMIN C) 500 MG tablet Take 1 tablet (500 mg total) by mouth daily. 10/05/22   Diane Baller, MD  Biotin 1000 MCG tablet Take 1,000  mcg by mouth daily.    [provider]  buPROPion  (WELLBUTRIN  SR) 150 MG 12 hr tablet TAKE 1 TABLET(150 MG) BY MOUTH EVERY MORNING 03/07/24   Diane Baller, MD  calcium  carbonate (OSCAL) 1500 (600 Ca) MG TABS tablet Take 600 mg of elemental calcium  by mouth daily.    [provider]  carboxymethylcellulose (REFRESH PLUS) 0.5 % SOLN Place 1 drop into both eyes 3 (three) times daily as needed (dry eyes).    [provider]  cephALEXin  (KEFLEX ) 250 MG/5ML suspension Take 5 mLs (250 mg total) by mouth daily. For UTI ppx 03/29/24   Diane Baller, MD  cholecalciferol (VITAMIN D3) 25 MCG (1000 UNIT) tablet Take 1,000 Units by mouth daily.    [provider]  docusate sodium  (COLACE) 100 MG capsule Take 200 mg by mouth daily.    [provider]  ELIQUIS  5 MG TABS tablet TAKE 1 TABLET(5 MG) BY MOUTH TWICE DAILY 03/25/24   Croitoru, Mihai, MD  Ensifentrine  (OHTUVAYRE ) 3 MG/2.5ML SUSP Inhale 1 Inhalation into the lungs in the morning and at bedtime. Twice daily via neb    [provider]  ferrous sulfate  325 (65 FE) MG EC tablet Take 1 tablet (325 mg total) by mouth daily with breakfast. 09/06/23   Cloretta Arley NOVAK, MD  Fluticasone -Umeclidin-Vilant (TRELEGY ELLIPTA ) 200-62.5-25 MCG/ACT AEPB Inhale 1 puff into the lungs daily. 06/05/23   Geronimo Amel, MD  furosemide  (LASIX ) 40 MG tablet  TAKE 1 TABLET BY MOUTH EVERY DAY. MAY TAKE AN EXTRA TABLET AS NEEDED FOR SWELLING Patient taking differently: Take 40 mg by mouth in the morning. 08/24/23   Croitoru, Mihai, MD  gabapentin  (NEURONTIN ) 100 MG capsule Take 1 capsule (100 mg total) by mouth 2 (two) times daily as needed (back pain - for daytime use). 04/24/23   Diane Baller, MD  gabapentin  (NEURONTIN ) 300 MG capsule Take 300 mg by mouth at bedtime.    [provider]  hydrocortisone  (ANUSOL -HC) 2.5 % rectal cream APPLY RECTALLY TO THE AFFECTED AREA TWICE DAILY 12/27/23   Diane Baller, MD  lisinopril  (ZESTRIL ) 5 MG tablet Take 5 mg by mouth daily.    [provider]  lovastatin  (MEVACOR ) 40 MG tablet TAKE 1 TABLET(40 MG) BY MOUTH AT BEDTIME 10/23/23   Diane Baller, MD  metoprolol  succinate (TOPROL  XL) 25 MG 24 hr tablet Take 1 tablet (25 mg total) by mouth every evening. 09/11/23   Aniceto Daphne CROME, NP  montelukast  (SINGULAIR ) 10 MG tablet TAKE 1 TABLET(10 MG) BY MOUTH AT BEDTIME 03/25/24   Geronimo Amel, MD  Multiple Vitamins-Minerals (PRESERVISION AREDS 2 PO) Take 2 capsules by mouth in the morning.    [provider]  ondansetron  (ZOFRAN ) 4 MG tablet Take 1 tablet (4 mg total) by mouth every 8 (eight) hours as needed for nausea or vomiting. 03/14/24   Diane Baller, MD  Phenazopyrid-Cranbry-C-Probiot (AZO URINARY TRACT SUPPORT PO) Take 1 capsule by mouth daily.    [provider]  polyethylene glycol (MIRALAX  / GLYCOLAX ) packet Take 17 g by mouth daily. Patient taking differently: Take 17 g by mouth daily as needed for mild constipation. 12/02/13   Maryjo Dorise ORN, PA-C  potassium chloride  (KLOR-CON ) 10 MEQ tablet TAKE 2 TABLETS(20 MEQ) BY MOUTH DAILY 02/09/24   Croitoru, Jerel, MD  promethazine  (PHENERGAN ) 12.5 MG tablet Take 1 tablet (12.5 mg total) by mouth every 8 (eight) hours as needed for nausea or vomiting. 11/29/23   Diane Baller, MD  Spacer/Aero-Holding Chambers (PRO COMFORT  SPACER ADULT) MISC To use with inhalers 03/29/24   Diane Baller, MD  traMADol  (ULTRAM ) 50 MG tablet Take 1-2 tablets (50-100 mg total) by mouth 2 (two) times daily as needed for moderate pain (pain score 4-6). 02/06/24   Diane Baller, MD  traZODone  (DESYREL ) 50 MG tablet Take 1 tablet (50 mg total) by mouth at bedtime. 03/29/24   Diane Baller, MD  Vibegron  (GEMTESA ) 75 MG TABS Take 75 mg by mouth daily.    [provider]  vitamin B-12 (CYANOCOBALAMIN ) 500 MCG tablet Take 500 mcg by mouth 2 (two) times a week.    [provider]  Zinc  50 MG CAPS Take 1 capsule (50 mg total) by mouth daily. 11/04/19   Diane Baller, MD    Physical Exam: BP 120/64 (BP Location: Right Arm)   Pulse 81   Temp 98 F (36.7 C) (Oral)   Resp (!) 21   Ht 5' 4 (1.626 m)   Wt 64 kg   SpO2 96%   BMI 24.22 kg/m  General:  Alert, oriented, calm, in no acute distress, elderly female resting comfortably on 4 L nasal cannula oxygen.  Her granddaughter is at the bedside.  Patient is a good historian. Cardiovascular: RRR, no murmurs or rubs, no peripheral edema  Respiratory: Breath sounds are globally diminished, with some diffuse rhonchi and wheezing.  No respiratory distress, no tachypnea or stridor. Abdomen: soft, nontender, nondistended, normal bowel tones heard  Skin: dry, no rashes  Musculoskeletal: no joint effusions, normal range of motion  Psychiatric: appropriate affect, normal speech  Neurologic: extraocular muscles intact, clear speech, moving all extremities with intact sensorium         Labs on Admission:  Basic Metabolic Panel: Recent Labs  Lab 04/02/24 1050  NA 140  K 3.8  CL 93*  CO2 36*  GLUCOSE 114*  BUN 31*  CREATININE 1.12*  CALCIUM  9.3  MG 2.1   Liver Function Tests: Recent Labs  Lab 04/02/24 1050  AST 17  ALT 12  ALKPHOS 74  BILITOT 0.7  PROT 6.9  ALBUMIN  2.8*   No results for input(s): LIPASE, AMYLASE in the last 168 hours. Recent Labs   Lab 04/02/24 1206  AMMONIA 19   CBC: Recent Labs  Lab 04/02/24 1050  WBC 11.5*  NEUTROABS 10.3*  HGB 12.3  HCT 38.2  MCV 111.4*  PLT 81*   Cardiac Enzymes: No results for input(s): CKTOTAL, CKMB, CKMBINDEX, TROPONINI in the last 168 hours. BNP (last 3 results) Recent Labs    08/27/23 1052 03/18/24 1615 04/02/24 1206  BNP 293.9* 513.9* 365.5*    ProBNP (last 3 results) Recent Labs    02/05/24 1519  PROBNP 754.0*    CBG: No results for input(s): GLUCAP in the last 168 hours.  Radiological Exams on Admission: DG Chest 2 View Result Date: 04/02/2024 CLINICAL DATA:  recent PNA and CHF, worsening oxygen requirement, cough, SOB, wheeze, ronchi and large rales, peripheral edema. EXAM: CHEST - 2 VIEW COMPARISON:  03/21/2024. FINDINGS: Re-demonstration of left retrocardiac airspace opacity obscuring the left hemidiaphragm, descending thoracic aorta and blunting the left lateral costophrenic angle, suggesting combination of left lung atelectasis and/or consolidation with pleural effusion. No significant interval change. There are new heterogeneous opacities overlying the right mid lower lung zones without volume loss, compatible with pneumonia. There is trace right pleural effusion, grossly unchanged since the prior study. Redemonstration of right apical pleural cap, unchanged. Redemonstration of chronic interstitial predominance, similar to the prior study. No frank  pulmonary edema. Stable cardio-mediastinal silhouette. There is a left sided 2-lead pacemaker. 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 No acute osseous abnormalities. The soft tissues are within normal limits. IMPRESSION: 1. New heterogeneous opacities overlying the right mid lower lung zones, compatible with pneumonia. 2. Stable left retrocardiac airspace opacity, suggesting combination of  left lung atelectasis and/or consolidation with pleural effusion. 3. Stable trace right pleural effusion. Electronically Signed   By: Ree Molt [name]   On: 04/02/2024 12:45   CT Head Wo Contrast Result Date: 04/02/2024 CLINICAL DATA:  Head trauma, minor (Age >= 65y) Head trauma, moderate-severe fall on thinners, sleepy EXAM: CT HEAD WITHOUT CONTRAST TECHNIQUE: Contiguous axial images were obtained from the base of the skull through the vertex without intravenous contrast. RADIATION DOSE REDUCTION: This exam was performed according to the departmental dose-optimization program which includes automated exposure control, adjustment of the mA and/or kV according to patient size and/or use of iterative reconstruction technique. COMPARISON:  CT of the head dated October 09, 2022. FINDINGS: Brain: Chronic lacunar infarct within the left caudate nucleus. Mild periventricular white matter disease. No evidence of hemorrhage, mass, acute cortical infarct or hydrocephalus. Vascular: Moderate calcific atheromatous disease within the carotid siphons and vertebral arteries. Skull: Intact and unremarkable. Sinuses/Orbits: Status post bilateral lens replacement. Paranasal sinuses are clear. Other: Negative. IMPRESSION: 1. No evidence of acute traumatic injury. 2. Stable chronic ischemic small vessel disease. Electronically Signed   By: Evalene Coho [name]   On: 04/02/2024 12:25   Assessment/Plan [name] is a 78 y.o. female with medical history significant for atrial fibrillation on Eliquis , anxiety, chronic diastolic congestive heart failure and COPD chronically on room air who was recently discharged from [facility] on 6/13 after a stay for community-acquired pneumonia and combined COPD exacerbation being admitted to the hospital with recurrent acute hypoxic respiratory failure with worsening pneumonia.  Bilateral pneumonia-with increased cough, hypoxia, new/worsening heterogenous opacities.  Hemodynamically  stable not meeting SIRS criteria. -Inpatient admission -Will broaden antibiotics to cefepime -Check MRSA swab, start IV vancomycin  if positive -Will obtain and follow-up blood cultures  Acute hypoxic respiratory failure-I suspect this is mainly due to her bilateral pneumonia, being treated as above.  Given her history of COPD and current wheezing, will give IV Solu-Medrol  as well.  COPD-with evidence of mild exacerbation -IV steroids -Albuterol  as needed -Incentive spirometer and flutter valve incentive  Heart failure with preserved EF-patient does not look particularly fluid overload exam, BNP is elevated but improved from prior admission. -Continue beta-blocker -Continue daily p.o. Lasix , but hold if renal function fails to improve  Atrial fibrillation-continue metoprolol  and Eliquis   Hypertension-will hold home lisinopril  due to AKI  Acute kidney injury-presumably due to her recent hospitalizations, feeling poorly and reduced p.o. intake. -Hold lisinopril  -Follow renal function with daily labs  Troponin-likely mild leak in the setting of acute infection -Continue to trend    Code Status: Full Code  Consults called: None  Admission status: The appropriate patient status for this patient is INPATIENT. Inpatient status is judged to be reasonable and necessary in order to provide the required intensity of service to ensure the patient's safety. The patient's presenting symptoms, physical exam findings, and initial radiographic and laboratory data in the context of their chronic comorbidities is felt to place them at high risk for further clinical deterioration. Furthermore, it is not anticipated that the patient will be medically  stable for discharge from the hospital within 2 midnights of admission.    I certify that at the point of admission it is my clinical judgment that the patient will require inpatient hospital care spanning beyond 2 midnights from the point of admission due  to high intensity of service, high risk for further deterioration and high frequency of surveillance required  Time spent: 59 minutes  Antinio Sanderfer CHRISTELLA Gail MD [facility] Pager [phone]  If 7PM-7AM, please contact night-coverage [email or web address] Password [email or web address]  04/02/2024, 2:36 PM

## 2024-04-03 ENCOUNTER — Inpatient Hospital Stay (HOSPITAL_COMMUNITY)

## 2024-04-03 DIAGNOSIS — J189 Pneumonia, unspecified organism: Secondary | ICD-10-CM | POA: Diagnosis not present

## 2024-04-03 LAB — BASIC METABOLIC PANEL WITH GFR
Anion gap: 11 (ref 5–15)
BUN: 36 mg/dL — ABNORMAL HIGH (ref 8–23)
CO2: 31 mmol/L (ref 22–32)
Calcium: 9 mg/dL (ref 8.9–10.3)
Chloride: 92 mmol/L — ABNORMAL LOW (ref 98–111)
Creatinine, Ser: 1.07 mg/dL — ABNORMAL HIGH (ref 0.44–1.00)
GFR, Estimated: 53 mL/min — ABNORMAL LOW (ref >60)
Glucose, Bld: 124 mg/dL — ABNORMAL HIGH (ref 70–99)
Potassium: 4.6 mmol/L (ref 3.5–5.1)
Sodium: 134 mmol/L — ABNORMAL LOW (ref 135–145)

## 2024-04-03 LAB — CBC
HCT: 35.9 % — ABNORMAL LOW (ref 36.0–46.0)
Hemoglobin: 11.7 g/dL — ABNORMAL LOW (ref 12.0–15.0)
MCH: 37 pg — ABNORMAL HIGH (ref 26.0–34.0)
MCHC: 32.6 g/dL (ref 30.0–36.0)
MCV: 113.6 fL — ABNORMAL HIGH (ref 80.0–100.0)
Platelets: 72 10*3/uL — ABNORMAL LOW (ref 150–400)
RBC: 3.16 MIL/uL — ABNORMAL LOW (ref 3.87–5.11)
RDW: 13.2 % (ref 11.5–15.5)
WBC: 9.8 10*3/uL (ref 4.0–10.5)
nRBC: 0 % (ref 0.0–0.2)

## 2024-04-03 LAB — URINE CULTURE: Culture: NO GROWTH

## 2024-04-03 MED ORDER — METRONIDAZOLE 500 MG/100ML IV SOLN
500.0000 mg | Freq: Two times a day (BID) | INTRAVENOUS | Status: DC
  Administered 2024-04-03 – 2024-04-08 (×12): 500 mg via INTRAVENOUS
  Filled 2024-04-03 (×13): qty 100

## 2024-04-03 NOTE — Progress Notes (Addendum)
   04/03/24 0908  TOC Brief Assessment  Insurance and Status Reviewed  Patient has primary care physician Yes  Home environment has been reviewed single family home  Prior level of function: independent  Prior/Current Home Services Current home services [facility] services with [facility])  Social Drivers of Health Review SDOH reviewed no interventions necessary  Readmission risk has been reviewed Yes  Transition of care needs transition of care needs identified, TOC will continue to follow    Pt currently receiving HH PT services through [facility]. Pt on home oxygen through [employer]. TOC will continue to follow for discharge needs.   Heather Saltness, MSW, LCSW 04/03/2024 9:09 AM

## 2024-04-03 NOTE — Evaluation (Signed)
 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 Known DISH with thoracic spine fusion and moderate kyphosis, h/o L spine fusion L2-5   Adjustment disorder with depressed mood 2014/10/30   11 yo son died MI Oct 23, 2014 Husband died car accident 04/24/2015   Anemia    Anticoagulant long-term use 06/14/2019   She takes warfarin for afib/flutter with CHADS2VASc score of 4 (age, sex, CHF, HTN), s/p 2 bioprosthetic valve replacements Coumadin  changed to eliquis  04-23-21 per cardiology   Aortic atherosclerosis 03/03/2022   Noted on 01/2022 chest CT   Arthritis    Arthropathy of spinal facet joint 03/08/2018   Ascending aortic aneurysm 03/02/2021   4.1cm on CT 01/2021 rec yearly monitoring   Bilateral hip pain 09/08/2016   Cancer (HCC)    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 Myelogram showed significant left sided foraminal stenosis at L2/3 and L3/4 in setting of  solid arthrodesis, planned DEXA and if stable osteopenia, considering anterolateral decompression with spacer.    CKD (chronic kidney disease) stage 2, GFR 60-89 ml/min 07/03/2015   Constipation    takes Miralax  daily as needed   COPD (chronic obstructive pulmonary disease)    Albuterol  inhaler daily as needed;Duoneb daily as needed;Spiriva  daily   COVID-19 virus infection 11/11/2022   DDD (degenerative disc disease)    cervical - kyphosis with mod DD changes C5/6 and C6/7; lumbar - early DD at L2/3 (Elsner)   Depression    DISH (diffuse idiopathic skeletal hyperostosis) 05/14/2019   By MRI noted fusion of lower thoracic vertebrae - likely contributing to unsteadiness (Elsner)   Dyspnea on exertion 06/05/2015   myoview  low risk 02/2018   Edema of both lower legs due to peripheral venous insufficiency    Essential hypertension, benign 03/24/2009   hx of not on nmeds currenlty and has been a while since on meds per pt   Heart murmur    Hematuria 06/17/2021   History of kidney stones    History of radiation therapy 05/30/11 to 07/07/11   rectum   History of rectal cancer 05/26/2011   Rectal cancer 2011/04/24, midrectum [id number], s/p neoadj chemo&XRT, lap LAR with diverting loop ileostomy 08/2011, ileostomy takedown 04/23/12 Discharged from oncology clinic 04/23/2016 Marquita)   History of shingles    History of vertebral fracture 11/26/2019   Old L1 vertebral compression fracture incidentally noted on imaging April 24, 2019    Hyperlipemia    takes Lovastatin  daily   Lacunar infarction  Left caudate   Left hip pain 07/29/2020   Legally blind in left eye    Lymphangioma 01/07/2021   Noted incidentally on carotid US  12/2020 CT neck - Likely benign lymphangioma 01/2021   Macrocytosis 03/27/2022   Folate and b12 checked 2023   Malaise and fatigue 01/22/2018   Meralgia paresthetica 12/13/2007   Mixed stress and urge urinary incontinence 03/16/2015   Failed oxybutynin  IR/ER. Vesicare  initially helped but then  no longer helpful.  myrbetriq  was not effective.  Saw urology (MacDiarmid) - UDS largely overactive bladder with mild-mod stress incontinence. Improved on abx course - maintained on macrodantin  and toviaz .    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   Pulmonary infiltrates 04/23/2019   06/13/2018-CT super D chest without contrast- generally stable chronic lung disease with pleural parenchymal scarring and scattered nodularity most of these nodules have been tree-in-bud in appearance likely postinfectious or inflammatory the dominant right lower lobe nodule seen on most recent study has resolved no new or enlarging nodules  04/22/2019-CT chest without contrast- waxing and waning per   Pulmonary nodule 02/23/2012   RLL nodule-28mm stable 2006, April 2009, and June 2009 Small pulm nodules Jan 2012 - > Oct 2012 without change   Rheumatic heart disease mitral stenosis    mod MS by echo 02/2014   Right leg swelling 06/11/2020   R venous US  06/2020 WNL: - No evidence of deep vein thrombosis  in the lower extremity. No indirect evidence of obstruction proximal to the inguinal ligament. - No cystic structure found in the popliteal fossa.   S/P aortic and mitral valve bioprostheses 12/08/2013   AVR 21 mm, MVR 25 mm-MagnaEase pericardia   S/P AVR (aortic valve replacement) 2015   bioprosthetic (Bartle)   S/P MVR (mitral valve replacement) 2015   bioprosthetic (Bartle)   Sepsis secondary to UTI (HCC) 08/22/2017   Severe aortic valve stenosis 10/01/2013   mild-mod by echo 02/2014   Skin lesion 01/03/2022   Small bowel obstruction, partial 08/2013   reolved without NGT placement.    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   Dr Amy [place]   Syncope 05/28/2018   Thrombocytopenia 11/04/2019   Typical atrial flutter 05/15/2019   Vitamin B12 deficiency 02/28/2015   Start B12 shots 02/2015    Vitamin D  deficiency 10/23/2016   Wounds, multiple    bilateral legs   Past Surgical History:  Past Surgical History:  Procedure Laterality Date   ANTERIOR LAT LUMBAR FUSION Left 07/17/2018   Procedure: Lumbar Two-Three Lumbar Three-Four Anterolateral decompression/interbody fusion with lateral plate fixation/Infuse;  Surgeon: Colon Shove, MD;  Location: [facility] OR;  Service: Neurosurgery;  Laterality: Left;  Lumbar Two-Three Lumbar Three-Four Anterolateral decompression/interbody fusion with lateral plate fixation/Infuse   AORTIC VALVE REPLACEMENT N/A 12/12/2013   Procedure: AORTIC VALVE REPLACEMENT (AVR);  Surgeon: Dorise MARLA Fellers, MD; Service: Open Heart Surgery   BACK SURGERY  2006, 2007   2006 SPACER, 2007 decompression and fusion L4/5   BOWEL RESECTION  04/02/2012   Procedure: SMALL BOWEL RESECTION;  Surgeon: Jina Nephew, MD;  Location: [facility] ORS;  Service: General;  Laterality: N/A;   BRONCHIAL WASHINGS  03/24/2021   Procedure: BRONCHIAL WASHINGS;  Surgeon: Geronimo Amel, MD;  Location: [facility] ENDOSCOPY;  Service: Endoscopy;;   CARDIOVERSION N/A 08/29/2023   Procedure:  CARDIOVERSION (CATH LAB);  Surgeon: Pietro Redell RAMAN, MD;  Location: [facility] INVASIVE CV LAB;  Service: Cardiovascular;  Laterality: N/A;   CARPAL TUNNEL RELEASE Bilateral    CATARACT EXTRACTION W/ INTRAOCULAR LENS IMPLANT Left 10/18/2016   CATARACT EXTRACTION W/ INTRAOCULAR LENS IMPLANT Right 12/13/2016   Dr. Rosan   CHOLECYSTECTOMY N/A 01/21/2015   chronic cholecystitis, Jina Nephew, MD   COLON RESECTION  08/25/2011   Procedure: COLON RESECTION LAPAROSCOPIC;  Surgeon: Jina Nephew, MD;  Location: [facility]S;  Service: General;  Laterality: N/A;  Laparoscopic Assisted Low Anterior Resection Diverting Ostomy and onQ pain pump   COLONOSCOPY  03/2013   1 polyp, rpt 3 yrs Marianne)   COLONOSCOPY  05/2021   diverticulosis and patent colon anastomosis with some edema/narrowing, rpt 5 yrs Marianne)   COLONOSCOPY WITH PROPOFOL  N/A 06/09/2016   patent colo-colonic anastomosis, rpt 5 yrs Marianne)   COLONOSCOPY WITH PROPOFOL  N/A 01/25/2024   TA, fair prep (consider repeat if needed), patent rectal anastomosis with congestion edema and stenosis, mild sigmoid diverticulosis Zane Groom, MD)   CYSTOSCOPY/URETEROSCOPY/HOLMIUM LASER/STENT PLACEMENT Right 07/03/2023   Procedure: CYSTOSCOPY RIGHT RETROGRADE PYEPLGRAM RIGHT URETEROSCOPY/HOLMIUM LASER/STENT PLACEMENT;  Surgeon: Carolee Sherwood JONETTA DOUGLAS, MD;  Location: [facility];  Service: Urology;  Laterality: Right;  1 HR FOR CASE   CYSTOSCOPY/URETEROSCOPY/HOLMIUM LASER/STENT PLACEMENT Right 07/17/2023   Procedure: CYSTOSCOPY/RIGHT URETEROSCOPY/STENT EXCHANGE;  Surgeon: Carolee Sherwood JONETTA DOUGLAS, MD;  Location: [facility];  Service: Urology;  Laterality: Right;  60 MINS FOR CASE   EP IMPLANTABLE DEVICE N/A 06/16/2016   Procedure: Pacemaker Implant;  Surgeon: Will Gladis Norton, MD;  Location: [facility] INVASIVE CV LAB;  Service: Cardiovascular;  Laterality: N/A;   ESOPHAGOGASTRODUODENOSCOPY (EGD) WITH PROPOFOL  N/A 01/25/2024   diffuse gastritis benign biopsy Zane, Groom, MD)   FOOT  SURGERY  left foot   hammer toe   ILEOSTOMY  08/25/2011   ILEOSTOMY CLOSURE  04/02/2012   Procedure: ILEOSTOMY TAKEDOWN;  Surgeon: Jina Nephew, MD;  Location: [facility];  Service: General;  Laterality: N/A;   INTRAOPERATIVE TRANSESOPHAGEAL ECHOCARDIOGRAM N/A 12/12/2013   Procedure: INTRAOPERATIVE TRANSESOPHAGEAL ECHOCARDIOGRAM;  Surgeon: Dorise MARLA Fellers, MD;  Location: [facility] OR;  Service: Open Heart Surgery;  Laterality: N/A;   LEFT AND RIGHT HEART CATHETERIZATION WITH CORONARY ANGIOGRAM N/A 11/27/2013   Procedure: LEFT AND RIGHT HEART CATHETERIZATION WITH CORONARY ANGIOGRAM;  Surgeon: [name], MD;  Location: [facility] CATH LAB;  Service: Cardiovascular;  Laterality: N/A;   MITRAL VALVE REPLACEMENT N/A 12/12/2013   Procedure: MITRAL VALVE (MV) REPLACEMENT OR REPAIR;  Surgeon: Dorise MARLA Fellers, MD; Service: Open Heart Surgery   POLYPECTOMY  01/25/2024   Procedure: POLYPECTOMY, INTESTINE;  Surgeon: Charlanne Groom, MD;  Location: [facility] END[facility];  Service: Gastroenterology;;   TEE WITHOUT CARDIOVERSION N/A 11/29/2013   Procedure: TRANSESOPHAGEAL ECHOCARDIOGRAM (TEE);  Surgeon: Wilbert  JONELLE Bihari, MD;  Location: [facility] ENDOSCOPY;  Service: Cardiovascular;  Laterality: N/A;   TEE WITHOUT CARDIOVERSION N/A 06/04/2019   Procedure: TRANSESOPHAGEAL ECHOCARDIOGRAM (TEE);  Surgeon: Francyne Headland, MD;  Location: [facility] ENDOSCOPY;  Service: Cardiovascular;  Laterality: N/A;   TONSILLECTOMY     TRANSESOPHAGEAL ECHOCARDIOGRAM (CATH LAB) N/A 08/29/2023   Procedure: TRANSESOPHAGEAL ECHOCARDIOGRAM;  Surgeon: Pietro Redell RAMAN, MD;  Location: [facility] INVASIVE CV LAB;  Service: Cardiovascular;  Laterality: N/A;   VIDEO BRONCHOSCOPY N/A 03/24/2021   Procedure: VIDEO BRONCHOSCOPY WITHOUT FLUORO;  Surgeon: Geronimo Amel, MD;  Location: [facility] ENDOS[facility];  Service: Endoscopy;  Laterality: N/A;  SMALL SCOPE,PREMEDICATE WITH NEBULIZED LIDOCAINE    HPI:  [name] is a 78 y.o. female who was recently discharged from [facility] on 6/13 after a  stay for community-acquired pneumonia and combined COPD exacerbation being admitted to the hospital with recurrent acute hypoxic respiratory failure with worsening pneumonia. CXR 6/24: New heterogeneous opacities overlying the right mid lower lung  zones, compatible with pneumonia.  Pt with with medical history significant for atrial fibrillation on Eliquis , anxiety, chronic diastolic congestive heart failure and COPD    Assessment / Plan / Recommendation  Clinical Impression  Pt presents with symptoms of what is suspected to be a primary esophageal dysphagia.  She reports needing to swallow twice to get things to go down, feelings of stasis, and bringing things back up once they are stuck.  She denies feeling of food or drink going down the wrong way.  Today she tolerated all consistencies trialed, including serial sips of thin liquid with no clinical s/s of aspiration.  She exhibited good oral clearance of solids.  Aside from this recent bout of pna which has been difficult to clear she says it has been quite some time since she has had a case of pneumonia.  CXR shows middle lobe rathter than lower lobe opacities.  Consider esophagram for further evaluation of symptoms.    Recommend continuing regular texture diet with thin liquid.   SLP Visit Diagnosis: Dysphagia, unspecified (R13.10)    Aspiration Risk  No limitations    Diet Recommendation Regular;Thin liquid    Liquid Administration via: Cup;Straw Medication Administration: Whole meds with liquid Supervision: Patient able to self feed Compensations: Slow rate;Small sips/bites Postural Changes: Seated upright at 90 degrees    Other  Recommendations Recommended Consults: Consider esophageal assessment Oral Care Recommendations: Oral care BID     Assistance Recommended at Discharge    Functional Status Assessment Patient has not had a recent decline in their functional status  Frequency and Duration  (N/A)          Prognosis  Prognosis for improved oropharyngeal function:  (N/A)      Swallow Study   General HPI: [name] is a 78 y.o. female who was recently discharged from [facility] on 6/13 after a stay for community-acquired pneumonia and combined COPD exacerbation being admitted to the hospital with recurrent acute hypoxic respiratory failure with worsening pneumonia. CXR 6/24: New heterogeneous opacities overlying the right mid lower lung  zones, compatible with pneumonia.  Pt with with medical history significant for atrial fibrillation on Eliquis , anxiety, chronic diastolic congestive heart failure and COPD Type of Study: Bedside Swallow Evaluation Previous Swallow Assessment: None Diet Prior to this Study: Regular;Thin liquids (Level 0) Temperature Spikes Noted: No Respiratory Status: Nasal cannula History of Recent Intubation: No Behavior/Cognition: Alert;Cooperative;Pleasant mood Oral Cavity Assessment: Within Functional Limits Oral Care Completed by SLP: No Oral Cavity - Dentition: Adequate  natural dentition Vision: Functional for self-feeding Self-Feeding Abilities: Able to feed self Patient Positioning: Upright in chair Baseline Vocal Quality: Normal Volitional Cough: Strong Volitional Swallow: Able to elicit    Oral/Motor/Sensory Function Overall Oral Motor/Sensory Function: Within functional limits Facial ROM: Within Functional Limits Facial Symmetry: Within Functional Limits Lingual ROM: Within Functional Limits Lingual Symmetry: Within Functional Limits Lingual Strength: Within Functional Limits Velum: Within Functional Limits Mandible: Within Functional Limits   Ice Chips Ice chips: Not tested   Thin Liquid Thin Liquid: Within functional limits Presentation: Straw    Nectar Thick Nectar Thick Liquid: Not tested   Honey Thick Honey Thick Liquid: Not tested   Puree Puree: Within functional limits Presentation: Spoon;Self Fed   Solid     Solid: Within functional  limits Presentation: Self Fed      Anette FORBES Grippe, MA, CCC-SLP Acute Rehabilitation Services Office: [phone] 04/03/2024,11:03 AM

## 2024-04-03 NOTE — Evaluation (Signed)
 Occupational Therapy Evaluation Patient Details Name: [name] MRN: [id number] DOB: [birth year] Today's Date: 04/03/2024   History of Present Illness   78 yr old female  who was recently discharged from [facility] on 6/13 after a stay for community-acquired pneumonia and combined COPD exacerbation being admitted to the hospital with recurrent acute hypoxic respiratory failure with worsening pneumonia. Pt with medical history significant for atrial fibrillation on Eliquis , anxiety, chronic diastolic congestive heart failure and COPD chronically.     Clinical Impressions The pt is currently presenting below her baseline level of functioning for self-care management, as she is limited by the below listed deficits (see OT problem list). During the session, she required min assist to stand using a RW, min assist for lower body dressing, and max assist for toileting at bathroom level. Pt was noted to be with general deconditioning, compromised endurance/fatigue with activity, and reports of feeling winded with activity. She will benefit from further OT services to maximize her independence with self-care tasks and to decrease the risk for restricted participation in meaningful activities.      If plan is discharge home, recommend the following:   Help with stairs or ramp for entrance;Assistance with cooking/housework;Assist for transportation;A little help with walking and/or transfers;A lot of help with bathing/dressing/bathroom     Functional Status Assessment   Patient has had a recent decline in their functional status and demonstrates the ability to make significant improvements in function in a reasonable and predictable amount of time.     Equipment Recommendations   None recommended by OT     Recommendations for Other Services         Precautions/Restrictions   Precautions Precautions: Fall Precaution/Restrictions Comments: monitor O2, on 2L at home; fell just  PTA Restrictions Weight Bearing Restrictions Per Provider Order: No     Mobility Bed Mobility               General bed mobility comments: pt was received seated in the chair    Transfers Overall transfer level: Needs assistance Equipment used: Rolling walker (2 wheels) Transfers: Sit to/from Stand Sit to Stand: Min assist                  Balance     Sitting balance-Leahy Scale: Good       Standing balance-Leahy Scale: Fair                             ADL either performed or assessed with clinical judgement   ADL Overall ADL's : Needs assistance/impaired Eating/Feeding: Independent;Sitting   Grooming: Set up;Sitting           Upper Body Dressing : Set up;Sitting   Lower Body Dressing: Minimal assistance;Sitting/lateral leans   Toilet Transfer: Minimal assistance;Rolling walker (2 wheels);Ambulation;Regular Toilet;Grab bars   Toileting- Clothing Manipulation and Hygiene: Maximal assistance;Sit to/from stand Toileting - Clothing Manipulation Details (indicate cue type and reason): Toileting tasks performed with the pt at bathroom level. She required assistance for peri-hygiene in standing, as well as for steadying assist and clothing management.             Vision Baseline Vision/History: 1 Wears glasses              Pertinent Vitals/Pain Pain Assessment Pain Assessment: No/denies pain     Extremity/Trunk Assessment Upper Extremity Assessment Upper Extremity Assessment: Overall WFL for tasks assessed;Right hand dominant   Lower Extremity Assessment Lower Extremity  Assessment: Overall WFL for tasks assessed       Communication Communication Communication: Impaired Factors Affecting Communication: Hearing impaired   Cognition Arousal: Alert Behavior During Therapy: WFL for tasks assessed/performed               OT - Cognition Comments: Oriented to person, place, month, year, and situation                  Following commands: Intact                  Home Living Family/patient expects to be discharged to:: Private residence Living Arrangements: Alone Available Help at Discharge: Family;Friend(s);Available PRN/intermittently Type of Home: House Home Access: Stairs to enter [employer] of Steps: 3 Entrance Stairs-Rails: Right Home Layout: One level     Bathroom Shower/Tub: Producer, television/film/video: Standard Bathroom Accessibility: Yes   Home Equipment: Architectural technologist (4 wheels);Shower seat - built in   Additional Comments: pt has assist with cleaning 1 x month      Prior Functioning/Environment Prior Level of Function : Independent/Modified Independent;Driving             Mobility Comments: Pt reports using a cane to ambulate longer community distances, mostly prepares microwave meals, and she drives. ADLs Comments: Modified independent to independent with ADLs.     OT Problem List: Decreased strength;Decreased activity tolerance;Impaired balance (sitting and/or standing);Decreased knowledge of use of DME or AE;Cardiopulmonary status limiting activity   OT Treatment/Interventions: Self-care/ADL training;Therapeutic activities;Therapeutic exercise;Energy conservation;DME and/or AE instruction;Patient/family education;Balance training      OT Goals(Current goals can be found in the care plan section)   Acute Rehab OT Goals OT Goal Formulation: With patient/family Time For Goal Achievement: 04/17/24 Potential to Achieve Goals: Good ADL Goals Pt Will Perform Grooming: with modified independence;standing Pt Will Perform Lower Body Dressing: with modified independence;sit to/from stand Pt Will Transfer to Toilet: with modified independence;ambulating Pt Will Perform Toileting - Clothing Manipulation and hygiene: with modified independence;sit to/from stand   OT Frequency:  Min 2X/week       AM-PAC OT 6 Clicks Daily Activity      Outcome Measure Help from another person eating meals?: None Help from another person taking care of personal grooming?: A Little Help from another person toileting, which includes using toliet, bedpan, or urinal?: A Lot Help from another person bathing (including washing, rinsing, drying)?: A Lot Help from another person to put on and taking off regular upper body clothing?: A Little Help from another person to put on and taking off regular lower body clothing?: A Little 6 Click Score: 17   End of Session Equipment Utilized During Treatment: Rolling walker (2 wheels);Oxygen Nurse Communication: Other (comment)  Activity Tolerance: Other (comment) (Fair tolerance) Patient left: in chair;with call bell/phone within reach;with family/visitor present  OT Visit Diagnosis: Muscle weakness (generalized) (M62.81);History of falling (Z91.81)                Time: 8391-8362 OT Time Calculation (min): 29 min Charges:  OT General Charges $OT Visit: 1 Visit OT Evaluation $OT Eval Moderate Complexity: 1 Mod OT Treatments $Self Care/Home Management : 8-22 mins    Delanna LITTIE Molt, OTR/L 04/03/2024, 5:02 PM

## 2024-04-03 NOTE — Progress Notes (Addendum)
 PROGRESS NOTE    [name]  FMW:[id number] DOB: 27-Sep-1946 DOA: 04/02/2024 PCP: [name], MD    Brief Narrative:  [name] is a 78 y.o. female with medical history significant for atrial fibrillation on Eliquis , anxiety, chronic diastolic congestive heart failure and COPD chronically on room air who was recently discharged from [facility] on 6/13 after a stay for community-acquired pneumonia and combined COPD exacerbation being admitted to the hospital with recurrent acute hypoxic respiratory failure with worsening pneumonia.  History is provided by the patient as well as her granddaughter who is at the bedside, they state that she was discharged home on 6/13 on 2 L nasal cannula oxygen.  She completed a course of oral antibiotics after discharge.  She was doing well at home, had remained on supplemental oxygen.  However over the last 2 to 3 days, patient and family noticed that she developed more of a wet cough, felt more dyspnea with exertion, weakness and unsteadiness on her feet.  She fell at home yesterday and tore up the skin on her right arm.    Assessment and Plan: Bilateral pneumonia-with increased cough, hypoxia, new/worsening heterogenous opacities.  Hemodynamically stable not meeting SIRS criteria. -Will broaden antibiotics to cefepime and add Flagyl  - blood cultures: No growth to date   Acute hypoxic respiratory failure - Wean O2 as able-discharge last admission on 2 L  COPD-with evidence of mild exacerbation -IV steroids -Albuterol  as needed -Incentive spirometer and flutter valve incentive   Heart failure with preserved EF-patient does not look particularly fluid overload exam, BNP is elevated but improved from prior admission. -Continue beta-blocker -Continue daily p.o. Lasix    Atrial fibrillation-continue metoprolol  and Eliquis    Hypertension-will hold home lisinopril  due to AKI   Acute kidney injury-presumably due to her recent  hospitalizations, feeling poorly and reduced p.o. intake. -Hold lisinopril  -Follow renal function with daily labs   Troponin-likely mild leak in the setting of acute infection -Continue to trend   Feelings of getting food stuck in her throat, evaluated by SLP who recommended DG esophagus   DVT prophylaxis:  apixaban  (ELIQUIS ) tablet 5 mg    Code Status: Full Code   Disposition Plan:  Level of care: Progressive Status is: Inpatient     Consultants:     Subjective: Still with wet cough  Objective: Vitals:   04/03/24 0003 04/03/24 0404 04/03/24 0600 04/03/24 0911  BP: (!) 107/53 131/78    Pulse: 76 77    Resp: 16     Temp: 98.2 F (36.8 C) 97.8 F (36.6 C)    TempSrc: Oral Oral    SpO2: 95% 99% 98% 95%  Weight:      Height:        Intake/Output Summary (Last 24 hours) at 04/03/2024 1119 Last data filed at 04/03/2024 0600 Gross per 24 hour  Intake 1300 ml  Output 250 ml  Net 1050 ml   Filed Weights   04/02/24 1012  Weight: 64 kg    Examination:   General: Appearance:    Well developed, well nourished female in no acute distress     Lungs:   Coarse breath sounds, on nasal cannula, respirations unlabored  Heart:    Normal heart rate.    MS:   All extremities are intact.    Neurologic:   Awake, alert       Data Reviewed: I have personally reviewed following labs and imaging studies  CBC: Recent Labs  Lab 04/02/24 1050 04/03/24 0418  WBC 11.5* 9.8  NEUTROABS 10.3*  --   HGB 12.3 11.7*  HCT 38.2 35.9*  MCV 111.4* 113.6*  PLT 81* 72*   Basic Metabolic Panel: Recent Labs  Lab 04/02/24 1050 04/03/24 0418  NA 140 134*  K 3.8 4.6  CL 93* 92*  CO2 36* 31  GLUCOSE 114* 124*  BUN 31* 36*  CREATININE 1.12* 1.07*  CALCIUM  9.3 9.0  MG 2.1  --    GFR: Estimated Creatinine Clearance: 37.4 mL/min (A) (by C-G formula based on SCr of 1.07 mg/dL (H)). Liver Function Tests: Recent Labs  Lab 04/02/24 1050  AST 17  ALT 12  ALKPHOS 74   BILITOT 0.7  PROT 6.9  ALBUMIN  2.8*   No results for input(s): LIPASE, AMYLASE in the last 168 hours. Recent Labs  Lab 04/02/24 1206  AMMONIA 19   Coagulation Profile: No results for input(s): INR, PROTIME in the last 168 hours. Cardiac Enzymes: No results for input(s): CKTOTAL, CKMB, CKMBINDEX, TROPONINI in the last 168 hours. BNP (last 3 results) Recent Labs    02/05/24 1519  PROBNP 754.0*   HbA1C: No results for input(s): HGBA1C in the last 72 hours. CBG: No results for input(s): GLUCAP in the last 168 hours. Lipid Profile: No results for input(s): CHOL, HDL, LDLCALC, TRIG, CHOLHDL, LDLDIRECT in the last 72 hours. Thyroid  Function Tests: Recent Labs    04/02/24 1050  TSH 1.395   Anemia Panel: No results for input(s): VITAMINB12, FOLATE, FERRITIN, TIBC, IRON, RETICCTPCT in the last 72 hours. Sepsis Labs: No results for input(s): PROCALCITON, LATICACIDVEN in the last 168 hours.  Recent Results (from the past 240 hours)  MRSA Next Gen by PCR, Nasal     Status: None   Collection Time: 04/02/24  6:10 PM   Specimen: Nasal Mucosa; Nasal Swab  Result Value Ref Range Status   MRSA by PCR Next Gen NOT DETECTED NOT DETECTED Final    Comment: (NOTE) The GeneXpert MRSA Assay (FDA approved for NASAL specimens only), is one component of a comprehensive MRSA colonization surveillance program. It is not intended to diagnose MRSA infection nor to guide or monitor treatment for MRSA infections. Test performance is not FDA approved in patients less than 101 years old. Performed at [facility], [address], [place], [place] [address]   Culture, blood (Routine X 2) w Reflex to ID Panel     Status: None (Preliminary result)   Collection Time: 04/02/24  6:33 PM   Specimen: BLOOD RIGHT ARM  Result Value Ref Range Status   Specimen Description   Final    BLOOD RIGHT ARM Performed at [facility],  [address] [place], [place] [address]    Special Requests   Final    BOTTLES DRAWN AEROBIC ONLY Blood Culture adequate volume Performed at [facility], [address], [place], [place] [address]    Culture   Final    NO GROWTH < 12 HOURS Performed at [facility], [address], [place], [place] [address]    Report Status PENDING  Incomplete  Culture, blood (Routine X 2) w Reflex to ID Panel     Status: None (Preliminary result)   Collection Time: 04/02/24  6:34 PM   Specimen: BLOOD RIGHT ARM  Result Value Ref Range Status   Specimen Description   Final    BLOOD RIGHT ARM Performed at [facility], [address] [place], [place] [address]  Special Requests   Final    BOTTLES DRAWN AEROBIC ONLY Blood Culture adequate volume Performed at [facility], [address], [place], [place] [address]    Culture   Final    NO GROWTH < 12 HOURS Performed at [facility], [address] [place], [place] [address]    Report Status PENDING  Incomplete         Radiology Studies: DG Chest 2 View Result Date: 04/02/2024 CLINICAL DATA:  recent PNA and CHF, worsening oxygen requirement, cough, SOB, wheeze, ronchi and large rales, peripheral edema. EXAM: CHEST - 2 VIEW COMPARISON:  03/21/2024. FINDINGS: Re-demonstration of left retrocardiac airspace opacity obscuring the left hemidiaphragm, descending thoracic aorta and blunting the left lateral costophrenic angle, suggesting combination of left lung atelectasis and/or consolidation with pleural effusion. No significant interval change. There are new heterogeneous opacities overlying the right mid lower lung zones without volume loss, compatible with pneumonia. There is trace right pleural effusion, grossly unchanged since the prior study. Redemonstration of right apical pleural cap, unchanged. Redemonstration of chronic interstitial predominance, similar to the prior study. No frank  pulmonary edema. Stable cardio-mediastinal silhouette. There is a left sided 2-lead pacemaker. 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 No acute osseous abnormalities. The soft tissues are within normal limits. IMPRESSION: 1. New heterogeneous opacities overlying the right mid lower lung zones, compatible with pneumonia. 2. Stable left retrocardiac airspace opacity, suggesting combination of left lung atelectasis and/or consolidation with pleural effusion. 3. Stable trace right pleural effusion. Electronically Signed   By: Ree Molt [name]   On: 04/02/2024 12:45   CT Head Wo Contrast Result Date: 04/02/2024 CLINICAL DATA:  Head trauma, minor (Age >= 65y) Head trauma, moderate-severe fall on thinners, sleepy EXAM: CT HEAD WITHOUT CONTRAST TECHNIQUE: Contiguous axial images were obtained from the base of the skull through the vertex without intravenous contrast. RADIATION DOSE REDUCTION: This exam was performed according to the departmental dose-optimization program which includes automated exposure control, adjustment of the mA and/or kV according to patient size and/or use of iterative reconstruction technique. COMPARISON:  CT of the head dated October 09, 2022. FINDINGS: Brain: Chronic lacunar infarct within the left caudate nucleus. Mild periventricular white matter disease. No evidence of hemorrhage, mass, acute cortical infarct or hydrocephalus. Vascular: Moderate calcific atheromatous disease within the carotid siphons and vertebral arteries. Skull: Intact and unremarkable. Sinuses/Orbits: Status post bilateral lens replacement. Paranasal sinuses are clear. Other: Negative. IMPRESSION: 1. No evidence of acute traumatic injury. 2. Stable chronic ischemic small vessel disease. Electronically Signed   By: Evalene Coho [name]   On: 04/02/2024 12:25        Scheduled Meds:   apixaban   5 mg Oral BID   budesonide -glycopyrrolate -formoterol   2 puff Inhalation BID   buPROPion   150 mg Oral Daily   feeding supplement  237 mL Oral BID BM   furosemide   40 mg Oral Daily   methylPREDNISolone  (SOLU-MEDROL ) injection  40 mg Intravenous Q12H   metoprolol  succinate  25 mg Oral QHS   montelukast   10 mg Oral QHS   pravastatin   40 mg Oral q1800   traZODone   50 mg Oral QHS   Continuous Infusions:  ceFEPime (MAXIPIME) IV 2 g (04/03/24 1011)   metronidazole  500 mg (04/03/24 1048)     LOS: 1 day    Time spent: 45 minutes spent on chart review, discussion  with nursing staff, consultants, updating family and interview/physical exam; more than 50% of that time was spent in counseling and/or coordination of care.    Harlene RAYMOND Bowl, DO [facility] Available via Epic secure chat 7am-7pm After these hours, please refer to coverage provider listed on [email or web address] 04/03/2024, 11:19 AM

## 2024-04-03 NOTE — Evaluation (Signed)
 Physical Therapy Evaluation Patient Details Name: [name] MRN: [id number] DOB: 12-13-1945 Today's Date: 04/03/2024  History of Present Illness  78 y.o. female  who was recently discharged from [facility] on 6/13 after a stay for community-acquired pneumonia and combined COPD exacerbation being admitted to the hospital with recurrent acute hypoxic respiratory failure with worsening pneumonia. Pt with medical history significant for atrial fibrillation on Eliquis , anxiety, chronic diastolic congestive heart failure and COPD chronically.  Clinical Impression  Pt admitted with above diagnosis. Pt ambulated 6' with RW with min/guard assist for balance and verbal cues for safe positioning in RW. SpO2 94% on 2L O2 with walking. She reports she had a fall just prior to this admission.  Pt lives alone and does not have access to 24/7 assistance. Patient will benefit from continued inpatient follow up therapy, <3 hours/day.  Pt currently with functional limitations due to the deficits listed below (see PT Problem List). Pt will benefit from acute skilled PT to increase their independence and safety with mobility to allow discharge.           If plan is discharge home, recommend the following: A little help with walking and/or transfers;A little help with bathing/dressing/bathroom;Assistance with cooking/housework;Assist for transportation;Help with stairs or ramp for entrance   Can travel by private vehicle   Yes    Equipment Recommendations Rolling walker (2 wheels)  Recommendations for Other Services       Functional Status Assessment Patient has had a recent decline in their functional status and demonstrates the ability to make significant improvements in function in a reasonable and predictable amount of time.     Precautions / Restrictions Precautions Precautions: Fall;Other (comment) (monitor O2) Precaution/Restrictions Comments: monitor O2, on 2L at home; fell just  PTA Restrictions Weight Bearing Restrictions Per Provider Order: No      Mobility  Bed Mobility               General bed mobility comments: pt seated in recliner when PT arrived    Transfers Overall transfer level: Needs assistance Equipment used: 1 person hand held assist Transfers: Sit to/from Stand, Bed to chair/wheelchair/BSC Sit to Stand: Min assist   Step pivot transfers: Min assist       General transfer comment: min A to power up and to steady, mildly unsteady    Ambulation/Gait Ambulation/Gait assistance: Contact guard assist Gait Distance (Feet): 70 Feet Assistive device: Rolling walker (2 wheels) Gait Pattern/deviations: Trunk flexed, Step-through pattern, Decreased stride length, Knee flexed in stance - right, Knee flexed in stance - left, Wide base of support Gait velocity: decreased     General Gait Details: VCs for proximity to RW, pt has significant kyphosis which is baseline (reports h/o back surgeries/chronic back pain), distance limited by back pain and fatigue. SpO2 94% on 2L O2. Mildly unsteady but no overt loss of balance.  Stairs            Wheelchair Mobility     Tilt Bed    Modified Rankin (Stroke Patients Only)       Balance Overall balance assessment: History of Falls, Needs assistance Sitting-balance support: Feet supported, No upper extremity supported Sitting balance-Leahy Scale: Good     Standing balance support: Bilateral upper extremity supported, During functional activity, Reliant on assistive device for balance Standing balance-Leahy Scale: Fair  Pertinent Vitals/Pain Pain Assessment Pain Assessment: 0-10 Pain Score: 7  Pain Location: low back Pain Descriptors / Indicators: Aching, Cramping, Discomfort, Squeezing, Sore Pain Intervention(s): Limited activity within patient's tolerance, Monitored during session, Patient requesting pain meds-RN notified, Repositioned     Home Living Family/patient expects to be discharged to:: Private residence Living Arrangements: Alone Available Help at Discharge: Family;Friend(s) Type of Home: House Home Access: Stairs to enter Entrance Stairs-Rails: Right Entrance Stairs-Number of Steps: 3   Home Layout: One level Home Equipment: Architectural technologist (4 wheels) Additional Comments: pt has assist with cleaning 1 x month    Prior Function Prior Level of Function : Independent/Modified Independent;Driving             Mobility Comments: pt reports using a cane to amb longer community distances, has groceries delivered and attends church and weekly hair appointment, pt is mod I for all ADLs, self care tasks ADLs Comments: pt reports ind with iADLs/ADLs     Extremity/Trunk Assessment   Upper Extremity Assessment Upper Extremity Assessment: Defer to OT evaluation    Lower Extremity Assessment Lower Extremity Assessment: LLE deficits/detail;RLE deficits/detail RLE Deficits / Details: knee ext +4/5 RLE Sensation: WNL RLE Coordination: WNL LLE Deficits / Details: knee ext +4/5 LLE Sensation: WNL LLE Coordination: WNL    Cervical / Trunk Assessment Cervical / Trunk Assessment: Kyphotic  Communication   Communication Communication: Impaired Factors Affecting Communication: Hearing impaired    Cognition Arousal: Alert Behavior During Therapy: WFL for tasks assessed/performed   PT - Cognitive impairments: No apparent impairments                         Following commands: Intact       Cueing       General Comments      Exercises     Assessment/Plan    PT Assessment Patient needs continued PT services  PT Problem List Decreased activity tolerance;Decreased balance;Decreased mobility;Cardiopulmonary status limiting activity       PT Treatment Interventions DME instruction;Gait training;Stair training;Functional mobility training;Therapeutic activities;Therapeutic  exercise;Balance training;Neuromuscular re-education;Patient/family education    PT Goals (Current goals can be found in the Care Plan section)  Acute Rehab PT Goals Patient Stated Goal: to be able to get back to my cat PT Goal Formulation: With patient/family Time For Goal Achievement: 04/03/24 Potential to Achieve Goals: Good    Frequency Min 3X/week     Co-evaluation               AM-PAC PT 6 Clicks Mobility  Outcome Measure Help needed turning from your back to your side while in a flat bed without using bedrails?: None Help needed moving from lying on your back to sitting on the side of a flat bed without using bedrails?: None Help needed moving to and from a bed to a chair (including a wheelchair)?: A Little Help needed standing up from a chair using your arms (e.g., wheelchair or bedside chair)?: A Little Help needed to walk in hospital room?: A Little Help needed climbing 3-5 steps with a railing? : A Lot 6 Click Score: 19    End of Session Equipment Utilized During Treatment: Gait belt Activity Tolerance: Patient limited by fatigue;Patient tolerated treatment well Patient left: in chair;with call bell/phone within reach;with family/visitor present;with chair alarm set Nurse Communication: Mobility status PT Visit Diagnosis: Unsteadiness on feet (R26.81);History of falling (Z91.81);Other abnormalities of gait and mobility (R26.89)    Time: 8772-8749 PT Time  Calculation (min) (ACUTE ONLY): 23 min   Charges:   PT Evaluation $PT Eval Moderate Complexity: 1 Mod PT Treatments $Gait Training: 8-22 mins PT General Charges $$ ACUTE PT VISIT: 1 Visit        Sylvan Delon Copp PT 04/03/2024  Acute Rehabilitation Services  Office [phone]

## 2024-04-03 NOTE — Progress Notes (Signed)
 Mobility Specialist - Progress Note   04/03/24 1042  Oxygen Therapy  O2 Device Nasal Cannula  O2 Flow Rate (L/min) 2 L/min  Mobility  Activity Ambulated with assistance in hallway  Level of Assistance Standby assist, set-up cues, supervision of patient - no hands on  Assistive Device Front wheel walker  Distance Ambulated (ft) 100 ft  Activity Response Tolerated well  Mobility Referral Yes  Mobility visit 1 Mobility  Mobility Specialist Start Time (ACUTE ONLY) 1025  Mobility Specialist Stop Time (ACUTE ONLY) 1040  Mobility Specialist Time Calculation (min) (ACUTE ONLY) 15 min   Pt received in recliner and agreeable to mobility. Distance limited d/t fatigue. Unable to obtain accurate SpO2 reading during ambulation. No complaints during session. Pt to recliner after session with all needs met.    [facility]

## 2024-04-04 ENCOUNTER — Encounter (HOSPITAL_COMMUNITY): Payer: Self-pay | Admitting: Internal Medicine

## 2024-04-04 ENCOUNTER — Inpatient Hospital Stay (HOSPITAL_COMMUNITY)

## 2024-04-04 DIAGNOSIS — R0602 Shortness of breath: Secondary | ICD-10-CM | POA: Diagnosis not present

## 2024-04-04 DIAGNOSIS — J189 Pneumonia, unspecified organism: Secondary | ICD-10-CM | POA: Diagnosis not present

## 2024-04-04 LAB — CBC
HCT: 31.4 % — ABNORMAL LOW (ref 36.0–46.0)
Hemoglobin: 10.7 g/dL — ABNORMAL LOW (ref 12.0–15.0)
MCH: 38.4 pg — ABNORMAL HIGH (ref 26.0–34.0)
MCHC: 34.1 g/dL (ref 30.0–36.0)
MCV: 112.5 fL — ABNORMAL HIGH (ref 80.0–100.0)
Platelets: 59 10*3/uL — ABNORMAL LOW (ref 150–400)
RBC: 2.79 MIL/uL — ABNORMAL LOW (ref 3.87–5.11)
RDW: 13.2 % (ref 11.5–15.5)
WBC: 11.8 10*3/uL — ABNORMAL HIGH (ref 4.0–10.5)
nRBC: 0 % (ref 0.0–0.2)

## 2024-04-04 LAB — ECHOCARDIOGRAM COMPLETE
AR max vel: 1.24 cm2
AV Area VTI: 1.32 cm2
AV Area mean vel: 1.2 cm2
AV Mean grad: 13 mmHg
AV Peak grad: 24.1 mmHg
Ao pk vel: 2.46 m/s
Area-P 1/2: 2.24 cm2
Calc EF: 72.7 %
Height: 64 in
MV VTI: 0.8 cm2
S' Lateral: 2.6 cm
Single Plane A2C EF: 80.7 %
Single Plane A4C EF: 60.9 %
Weight: 2257.51 [oz_av]

## 2024-04-04 LAB — BASIC METABOLIC PANEL WITH GFR
Anion gap: 11 (ref 5–15)
BUN: 47 mg/dL — ABNORMAL HIGH (ref 8–23)
CO2: 28 mmol/L (ref 22–32)
Calcium: 9.2 mg/dL (ref 8.9–10.3)
Chloride: 94 mmol/L — ABNORMAL LOW (ref 98–111)
Creatinine, Ser: 1.06 mg/dL — ABNORMAL HIGH (ref 0.44–1.00)
GFR, Estimated: 54 mL/min — ABNORMAL LOW (ref >60)
Glucose, Bld: 160 mg/dL — ABNORMAL HIGH (ref 70–99)
Potassium: 4.2 mmol/L (ref 3.5–5.1)
Sodium: 133 mmol/L — ABNORMAL LOW (ref 135–145)

## 2024-04-04 LAB — PROCALCITONIN: Procalcitonin: <0.1 ng/mL

## 2024-04-04 MED ORDER — FUROSEMIDE 10 MG/ML IJ SOLN
20.0000 mg | Freq: Once | INTRAMUSCULAR | Status: AC
  Administered 2024-04-04: 20 mg via INTRAVENOUS
  Filled 2024-04-04: qty 2

## 2024-04-04 NOTE — NC FL2 (Signed)
 [place]  MEDICAID FL2 LEVEL OF CARE FORM     IDENTIFICATION  Patient Name: [name] Birthdate: 08-19-1946 Sex: female Admission Date (Current Location): 04/02/2024  [facility] and [place] Number:  Producer, television/film/video and Address:  [facility] [place] [facility],  [address] [place], [place] [address]      Provider Number: [id number]  Attending Physician Name and Address:  Juvenal Harlene PENNER, DO  Relative Name and Phone Number:  Allexa, Acoff (Son)  [phone] [facility])    Current Level of Care: Hospital Recommended Level of Care: Skilled Nursing Facility Prior Approval Number:    Date Approved/Denied:   PASRR Number: [id number]  Discharge Plan: SNF    Current Diagnoses: Patient Active Problem List   Diagnosis Date Noted   Multifocal pneumonia 04/02/2024   Elevated brain natriuretic peptide (BNP) level 03/20/2024   CAP (community acquired pneumonia) 03/19/2024   Blood blister 03/14/2024   Ear fullness, bilateral 12/05/2023   Pressure sore on heel 06/15/2023   Lump of skin 06/06/2023   Head injury 04/24/2023   Fall with injury 03/13/2023   Imbalance 03/13/2023   Venous ulcer of leg (HCC) 01/24/2023   Fatigue 10/05/2022   Positive ANA (antinuclear antibody) 05/30/2022   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   Lacunar infarction [facility])    Aortic atherosclerosis 03/03/2022   Peripheral neuropathy 01/03/2022   Hematuria 06/17/2021   Pulmonary artery hypertension 06/17/2021   Pacemaker 03/02/2021   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   Ascending aortic aneurysm (HCC) 03/02/2021   Lymphangioma 01/07/2021   Persistent atrial fibrillation 12/09/2020   Carotid stenosis 12/08/2020   Left hip pain 07/29/2020   Right leg swelling 06/11/2020   History of vertebral fracture 11/26/2019   Acute midline low back pain without sciatica 11/26/2019   Thrombocytopenia 11/04/2019   Anticoagulant long-term use 06/14/2019   Atrial flutter (HCC) 05/15/2019   DISH  (diffuse idiopathic skeletal hyperostosis) 05/14/2019   Pulmonary infiltrates 04/23/2019   Other spondylosis with radiculopathy, lumbar region 07/17/2018   Syncope 05/28/2018   Arthropathy of spinal facet joint 03/08/2018   Malaise and fatigue 01/22/2018   Pain of right sacroiliac joint 08/09/2017   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 12/31/2016   Vitamin D  deficiency 10/23/2016   Bilateral hip pain 09/08/2016   History of colonic polyps    Pedal edema 02/25/2016   Skin rash 12/22/2015   Advanced care planning/counseling discussion 10/28/2015   CKD (chronic kidney disease) stage 2, GFR 60-89 ml/min 07/03/2015   Dyspnea on exertion 06/05/2015   Mixed stress and urge urinary incontinence 03/16/2015   Vitamin B12 deficiency 02/28/2015   Chronic insomnia 02/25/2015   Osteoporosis 12/09/2014   Medicare annual wellness visit, subsequent 10/15/2014   Health maintenance examination 10/15/2014   Adjustment disorder with mixed anxiety and depressed mood 10/15/2014   Chest pain 08/25/2014   Mitral valve replaced 12/2013   Nausea 11/26/2013   Chronic heart failure with preserved ejection fraction (HFpEF) (HCC) 10/10/2013   Acute respiratory failure with hypoxia (HCC) 10/10/2013   Pulmonary nodules 02/23/2012   History of radiation therapy    Macrocytic anemia 09/21/2011   History of rectal cancer 05/26/2011   Essential hypertension, benign 03/24/2009   Overweight (BMI 25.0-29.9) 03/05/2008   Chronic lower back pain 03/05/2008   Meralgia paresthetica 12/13/2007   GERD (gastroesophageal reflux disease) 08/06/2007   HLD (hyperlipidemia) 05/03/2007   COPD (chronic obstructive pulmonary disease) 05/03/2007    Orientation RESPIRATION BLADDER Height & Weight     Self, Time, Situation, Place  O2 (  2L) Continent Weight: 141 lb 1.5 oz (64 kg) Height:  5' 4 (162.6 cm)  BEHAVIORAL SYMPTOMS/MOOD NEUROLOGICAL BOWEL NUTRITION STATUS      Continent Diet (heart healthy)  AMBULATORY STATUS  COMMUNICATION OF NEEDS Skin   Limited Assist Verbally Other (Comment), PU Stage and Appropriate Care (see d/c summary)                       Personal Care Assistance Level of Assistance  Bathing, Feeding Bathing Assistance: Limited assistance Feeding assistance: Limited assistance       Functional Limitations Info  Sight, Hearing, Speech Sight Info: Adequate Hearing Info: Impaired (hard of hearing) Speech Info: Adequate    SPECIAL CARE FACTORS FREQUENCY  PT (By licensed PT), OT (By licensed OT)     PT Frequency: 5 x a week OT Frequency: 5 x a week            Contractures Contractures Info: Not present    Additional Factors Info  Code Status, Allergies Code Status Info: full Allergies Info: Tape  Codeine  Prednisone   Sulfa Antibiotics           Current Medications (04/04/2024):  This is the current hospital active medication list Current Facility-Administered Medications  Medication Dose Route Frequency Provider Last Rate Last Admin   acetaminophen  (TYLENOL ) tablet 650 mg  650 mg Oral Q6H PRN Zella, Mir M, MD       Or   acetaminophen  (TYLENOL ) suppository 650 mg  650 mg Rectal Q6H PRN Zella, Mir M, MD       albuterol  (PROVENTIL ) (2.5 MG/3ML) 0.083% nebulizer solution 2.5 mg  2.5 mg Nebulization Q2H PRN Zella, Mir M, MD       budesonide -glycopyrrolate -formoterol  (BREZTRI ) 160-9-4.8 MCG/ACT inhaler 2 puff  2 puff Inhalation BID Zella, Mir M, MD   2 puff at 04/04/24 9245   buPROPion  (WELLBUTRIN  SR) 12 hr tablet 150 mg  150 mg Oral Daily Ikramullah, Mir M, MD   150 mg at 04/04/24 1031   ceFEPIme (MAXIPIME) 2 g in sodium chloride  0.9 % 100 mL IVPB  2 g Intravenous BID Mark Bard LABOR, RPH 200 mL/hr at 04/04/24 1121 2 g at 04/04/24 1121   feeding supplement (ENSURE PLUS HIGH PROTEIN) liquid 237 mL  237 mL Oral BID BM Zella, Mir M, MD   237 mL at 04/04/24 1104   furosemide  (LASIX ) injection 20 mg  20 mg Intravenous Once Vann, Jessica U, DO        guaiFENesin -dextromethorphan  (ROBITUSSIN DM) 100-10 MG/5ML syrup 10 mL  10 mL Oral Q4H PRN Zella, Mir M, MD   10 mL at 04/02/24 2238   methylPREDNISolone  sodium succinate (SOLU-MEDROL ) 40 mg/mL injection 40 mg  40 mg Intravenous Q12H Zella, Mir M, MD   40 mg at 04/04/24 1031   metoprolol  succinate (TOPROL -XL) 24 hr tablet 25 mg  25 mg Oral QHS Zella, Mir M, MD   25 mg at 04/02/24 2100   metroNIDAZOLE  (FLAGYL ) IVPB 500 mg  500 mg Intravenous Q12H Vann, Jessica U, DO 100 mL/hr at 04/04/24 1228 500 mg at 04/04/24 1228   montelukast  (SINGULAIR ) tablet 10 mg  10 mg Oral QHS Zella, Mir M, MD   10 mg at 04/03/24 2140   ondansetron  (ZOFRAN ) tablet 4 mg  4 mg Oral Q6H PRN Zella, Mir M, MD       Or   ondansetron  (ZOFRAN ) injection 4 mg  4 mg Intravenous Q6H PRN Zella Katha HERO, MD  pravastatin  (PRAVACHOL ) tablet 40 mg  40 mg Oral q1800 Ikramullah, Mir M, MD   40 mg at 04/03/24 1751   traMADol  (ULTRAM ) tablet 50-100 mg  50-100 mg Oral BID PRN Zella Katha HERO, MD   100 mg at 04/04/24 0534   traZODone  (DESYREL ) tablet 25 mg  25 mg Oral QHS PRN Zella, Mir M, MD       traZODone  (DESYREL ) tablet 50 mg  50 mg Oral QHS Zella, Mir M, MD   50 mg at 04/03/24 2141     Discharge Medications: Please see discharge summary for a list of discharge medications.  Relevant Imaging Results:  Relevant Lab Results:   Additional Information [id number]  Tawni HERO Eliany Mccarter, LCSW

## 2024-04-04 NOTE — Progress Notes (Addendum)
 PROGRESS NOTE    [name]  FMW:[id number] DOB: [birth year] DOA: 04/02/2024 PCP: [name], MD    Brief Narrative:  [name] is a 78 y.o. female with medical history significant for atrial fibrillation on Eliquis , anxiety, chronic diastolic congestive heart failure and COPD chronically on room air who was recently discharged from [facility] on 6/13 after a stay for community-acquired pneumonia and combined COPD exacerbation being admitted to the hospital with recurrent acute hypoxic respiratory failure with worsening pneumonia.  History is provided by the patient as well as her granddaughter who is at the bedside, they state that she was discharged home on 6/13 on 2 L nasal cannula oxygen.  She completed a course of oral antibiotics after discharge.  She was doing well at home, had remained on supplemental oxygen.  However over the last 2 to 3 days, patient and family noticed that she developed more of a wet cough, felt more dyspnea with exertion, weakness and unsteadiness on her feet.  She fell at home yesterday and tore up the skin on her right arm.    Assessment and Plan: Bilateral pneumonia-with increased cough, hypoxia, new/worsening heterogenous opacities.  Hemodynamically stable not meeting SIRS criteria. -Will broaden antibiotics to cefepime  and add Flagyl --monitor for improvement - blood cultures: No growth to date   Acute hypoxic respiratory failure - Wean O2 as able-discharge last admission on 2 L   oozing wound on right arm - Wound care - Talked to general surgery: Would hold Eliquis  and place some pressure wraps on arm-- unable to hold eliquis  so will start heparin  gtt  COPD-with evidence of mild exacerbation -IV steroids -Albuterol  as needed -Incentive spirometer and flutter valve incentive   Heart failure with preserved EF-patient does not look particularly fluid overload exam, BNP is elevated but improved from prior admission. -Continue  beta-blocker -Continue daily p.o. Lasix    Atrial fibrillation-continue metoprolol   -Hold Eliquis  for continued bleeding of her arm- heparin  gtt   Hypertension-will hold home lisinopril  due to AKI   Acute kidney injury-presumably due to her recent hospitalizations, feeling poorly and reduced p.o. intake. -Hold lisinopril  - Creatinine stable   Troponin-likely mild leak in the setting of acute infection - Stable   Thrombocytopenia - Trending down - Monitor closely  DVT prophylaxis:     Code Status: Full Code   Disposition Plan:  Level of care: Progressive Status is: Inpatient   Appears patient is agreeable to go to rehab for short-term placement    Consultants:     Subjective: Right arm continues to ooze  Objective: Vitals:   04/03/24 2033 04/03/24 2156 04/04/24 0417 04/04/24 0755  BP: (!) 97/53  139/63   Pulse: 76  72   Resp: 18  18   Temp: 97.7 F (36.5 C)     TempSrc: Oral     SpO2: 98% 95% 99% 98%  Weight:      Height:        Intake/Output Summary (Last 24 hours) at 04/04/2024 1129 Last data filed at 04/04/2024 0500 Gross per 24 hour  Intake 860 ml  Output --  Net 860 ml   Filed Weights   04/02/24 1012  Weight: 64 kg    Examination:   General: Appearance:    Well developed, well nourished female in no acute distress     Lungs:   Coarse breath sounds, on nasal cannula, respirations unlabored  Heart:    Normal heart rate.    MS:   All extremities are intact.  Neurologic:   Awake, alert       Data Reviewed: I have personally reviewed following labs and imaging studies  CBC: Recent Labs  Lab 04/02/24 1050 04/03/24 0418 04/04/24 0357  WBC 11.5* 9.8 11.8*  NEUTROABS 10.3*  --   --   HGB 12.3 11.7* 10.7*  HCT 38.2 35.9* 31.4*  MCV 111.4* 113.6* 112.5*  PLT 81* 72* 59*   Basic Metabolic Panel: Recent Labs  Lab 04/02/24 1050 04/03/24 0418 04/04/24 0357  NA 140 134* 133*  K 3.8 4.6 4.2  CL 93* 92* 94*  CO2 36* 31 28   GLUCOSE 114* 124* 160*  BUN 31* 36* 47*  CREATININE 1.12* 1.07* 1.06*  CALCIUM  9.3 9.0 9.2  MG 2.1  --   --    GFR: Estimated Creatinine Clearance: 37.8 mL/min (A) (by C-G formula based on SCr of 1.06 mg/dL (H)). Liver Function Tests: Recent Labs  Lab 04/02/24 1050  AST 17  ALT 12  ALKPHOS 74  BILITOT 0.7  PROT 6.9  ALBUMIN  2.8*   No results for input(s): LIPASE, AMYLASE in the last 168 hours. Recent Labs  Lab 04/02/24 1206  AMMONIA 19   Coagulation Profile: No results for input(s): INR, PROTIME in the last 168 hours. Cardiac Enzymes: No results for input(s): CKTOTAL, CKMB, CKMBINDEX, TROPONINI in the last 168 hours. BNP (last 3 results) Recent Labs    02/05/24 1519  PROBNP 754.0*   HbA1C: No results for input(s): HGBA1C in the last 72 hours. CBG: No results for input(s): GLUCAP in the last 168 hours. Lipid Profile: No results for input(s): CHOL, HDL, LDLCALC, TRIG, CHOLHDL, LDLDIRECT in the last 72 hours. Thyroid  Function Tests: Recent Labs    04/02/24 1050  TSH 1.395   Anemia Panel: No results for input(s): VITAMINB12, FOLATE, FERRITIN, TIBC, IRON, RETICCTPCT in the last 72 hours. Sepsis Labs: Recent Labs  Lab 04/04/24 0558  PROCALCITON <0.10    Recent Results (from the past 240 hours)  Urine Culture     Status: None   Collection Time: 04/02/24  2:53 PM   Specimen: Urine, Random  Result Value Ref Range Status   Specimen Description   Final    URINE, RANDOM Performed at [facility], [address] [place], [place] [address]    Special Requests   Final    NONE Reflexed from [phone] Performed at [facility], [address], [place], [place] [address]    Culture   Final    NO GROWTH Performed at [facility], [address] [place], [place] [address]    Report Status 04/03/2024 FINAL  Final  MRSA Next Gen by PCR, Nasal     Status: None   Collection  Time: 04/02/24  6:10 PM   Specimen: Nasal Mucosa; Nasal Swab  Result Value Ref Range Status   MRSA by PCR Next Gen NOT DETECTED NOT DETECTED Final    Comment: (NOTE) The GeneXpert MRSA Assay (FDA approved for NASAL specimens only), is one component of a comprehensive MRSA colonization surveillance program. It is not intended to diagnose MRSA infection nor to guide or monitor treatment for MRSA infections. Test performance is not FDA approved in patients less than 74 years old. Performed at [facility], [address], [place], [place] [address]   Culture, blood (Routine X 2) w Reflex to ID Panel     Status: None (Preliminary result)   Collection Time: 04/02/24  6:33 PM   Specimen:  BLOOD RIGHT ARM  Result Value Ref Range Status   Specimen Description   Final    BLOOD RIGHT ARM Performed at [facility], [address], [place], [place] [address]    Special Requests   Final    BOTTLES DRAWN AEROBIC ONLY Blood Culture adequate volume Performed at [facility], [address], [place], [place] [address]    Culture   Final    NO GROWTH 2 DAYS Performed at [facility], [address], [place], [place] [address]    Report Status PENDING  Incomplete  Culture, blood (Routine X 2) w Reflex to ID Panel     Status: None (Preliminary result)   Collection Time: 04/02/24  6:34 PM   Specimen: BLOOD RIGHT ARM  Result Value Ref Range Status   Specimen Description   Final    BLOOD RIGHT ARM Performed at [facility], [address] [place], [place] [address]    Special Requests   Final    BOTTLES DRAWN AEROBIC ONLY Blood Culture adequate volume Performed at [facility], [address], [place], [place] [address]    Culture   Final    NO GROWTH 2 DAYS Performed at [facility], [address], [place], [place] [address]    Report Status PENDING  Incomplete         Radiology Studies: DG ESOPHAGUS W  SINGLE CM (SOL OR THIN BA) Result Date: 04/03/2024 CLINICAL DATA:  Dysphagia. Recurrent phlegm in throat, which patient is unable to swallow. EXAM: ESOPHAGUS/BARIUM SWALLOW STUDY TECHNIQUE: Single contrast esophagram was performed using thin liquid barium. Exam was technically limited by patient's frail physical condition, as she was unable to stand erect, lay supine, or roll on fluoroscopy table. Exam was limited to LPO and RPO projections in semi-erect position. FLUOROSCOPY: Radiation Exposure Index (as provided by the fluoroscopic device): 33.1 mGy Kerma COMPARISON:  None Available. FINDINGS: No evidence of esophageal obstruction, constricting mass, or stricture. Elevated left hemidiaphragm noted. No hiatal hernia seen. No gastroesophageal reflux demonstrated on limited exam. IMPRESSION: Technically limited exam. No evidence of esophageal stricture or hiatal hernia. Electronically Signed   By: Norleen DELENA Kil [name]   On: 04/03/2024 15:52        Scheduled Meds:  budesonide -glycopyrrolate -formoterol   2 puff Inhalation BID   buPROPion   150 mg Oral Daily   feeding supplement  237 mL Oral BID BM   furosemide   40 mg Oral Daily   methylPREDNISolone  (SOLU-MEDROL ) injection  40 mg Intravenous Q12H   metoprolol  succinate  25 mg Oral QHS   montelukast   10 mg Oral QHS   pravastatin   40 mg Oral q1800   traZODone   50 mg Oral QHS   Continuous Infusions:  ceFEPime  (MAXIPIME ) IV 2 g (04/04/24 1121)   metronidazole  500 mg (04/03/24 2257)     LOS: 2 days    Time spent: 45 minutes spent on chart review, discussion with nursing staff, consultants, updating family and interview/physical exam; more than 50% of that time was spent in counseling and/or coordination of care.    Harlene RAYMOND Bowl, DO [facility] Available via Epic secure chat 7am-7pm After these hours, please refer to coverage provider listed on [email or web address] 04/04/2024, 11:29 AM

## 2024-04-04 NOTE — Progress Notes (Addendum)
 2D echo attempted twice, patient in chair. Will try later when patient in bed

## 2024-04-04 NOTE — TOC Initial Note (Signed)
 Transition of Care [facility]) - Initial/Assessment Note    Patient Details  Name: [name] MRN: [id number] Date of Birth: 12-19-1945  Transition of Care [facility]) CM/SW Contact:    Diane CHRISTELLA Eva, LCSW Phone Number: 04/04/2024, 11:19 AM  Clinical Narrative:                  CSW received permission from pt to discuss SNF recommendation with pt's granddaughter [name]. She is agreeable and would like [facility]. CSW explained the SNF process noting pt will need insurance auth.pt's granddaughter verbalized understanding. CSW to fax pt out for SNF. TOC to follow .  Expected Discharge Plan: Skilled Nursing Facility Barriers to Discharge: Continued Medical Work up   Patient Goals and CMS Choice Patient states their goals for this hospitalization and ongoing recovery are:: SNF to get stronger          Expected Discharge Plan and Services       Living arrangements for the past 2 months: Single Family Home                                      Prior Living Arrangements/Services Living arrangements for the past 2 months: Single Family Home Lives with:: Self Patient language and need for interpreter reviewed:: Yes Do you feel safe going back to the place where you live?: Yes      Need for Family Participation in Patient Care: No (Comment) Care giver support system in place?: No (comment)   Criminal Activity/Legal Involvement Pertinent to Current Situation/Hospitalization: No - Comment as needed  Activities of Daily Living   ADL Screening (condition at time of admission) Independently performs ADLs?: Yes (appropriate for developmental age) Is the patient deaf or have difficulty hearing?: Yes Does the patient have difficulty seeing, even when wearing glasses/contacts?: No Does the patient have difficulty concentrating, remembering, or making decisions?: No  Permission Sought/Granted                  Emotional Assessment Appearance:: Appears stated  age Attitude/Demeanor/Rapport: Gracious Affect (typically observed): Accepting Orientation: : Oriented to Self, Oriented to Place, Oriented to  Time, Oriented to Situation Alcohol  / Substance Use: Never Used Psych Involvement: No (comment)  Admission diagnosis:  Multifocal pneumonia [J18.9] Pneumonia due to infectious organism, unspecified laterality, unspecified part of lung [J18.9] Patient Active Problem List   Diagnosis Date Noted   Multifocal pneumonia 04/02/2024   Elevated brain natriuretic peptide (BNP) level 03/20/2024   CAP (community acquired pneumonia) 03/19/2024   Blood blister 03/14/2024   Ear fullness, bilateral 12/05/2023   Pressure sore on heel 06/15/2023   Lump of skin 06/06/2023   Head injury 04/24/2023   Fall with injury 03/13/2023   Imbalance 03/13/2023   Venous ulcer of leg (HCC) 01/24/2023   Fatigue 10/05/2022   Positive ANA (antinuclear antibody) 05/30/2022   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   Lacunar infarction [facility])    Aortic atherosclerosis 03/03/2022   Peripheral neuropathy 01/03/2022   Hematuria 06/17/2021   Pulmonary artery hypertension 06/17/2021   Pacemaker 03/02/2021   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   Ascending aortic aneurysm (HCC) 03/02/2021   Lymphangioma 01/07/2021   Persistent atrial fibrillation 12/09/2020   Carotid stenosis 12/08/2020   Left hip pain 07/29/2020   Right leg swelling 06/11/2020   History of vertebral fracture 11/26/2019   Acute midline low back pain without sciatica 11/26/2019   Thrombocytopenia  11/04/2019   Anticoagulant long-term use 06/14/2019   Atrial flutter (HCC) 05/15/2019   DISH (diffuse idiopathic skeletal hyperostosis) 05/14/2019   Pulmonary infiltrates 04/23/2019   Other spondylosis with radiculopathy, lumbar region 07/17/2018   Syncope 05/28/2018   Arthropathy of spinal facet joint 03/08/2018   Malaise and fatigue 01/22/2018   Pain of right sacroiliac joint 08/09/2017   2nd degree  atrioventricular block 12/31/2016   Vitamin D  deficiency 10/23/2016   Bilateral hip pain 09/08/2016   History of colonic polyps    Pedal edema 02/25/2016   Skin rash 12/22/2015   Advanced care planning/counseling discussion 10/28/2015   CKD (chronic kidney disease) stage 2, GFR 60-89 ml/min 07/03/2015   Dyspnea on exertion 06/05/2015   Mixed stress and urge urinary incontinence 03/16/2015   Vitamin B12 deficiency 02/28/2015   Chronic insomnia 02/25/2015   Osteoporosis 12/09/2014   Medicare annual wellness visit, subsequent 10/15/2014   Health maintenance examination 10/15/2014   Adjustment disorder with mixed anxiety and depressed mood 10/15/2014   Chest pain 08/25/2014   Mitral valve replaced 12/2013   Nausea 11/26/2013   Chronic heart failure with preserved ejection fraction (HFpEF) (HCC) 10/10/2013   Acute respiratory failure with hypoxia (HCC) 10/10/2013   Pulmonary nodules 02/23/2012   History of radiation therapy    Macrocytic anemia 09/21/2011   History of rectal cancer 05/26/2011   Essential hypertension, benign 03/24/2009   Overweight (BMI 25.0-29.9) 03/05/2008   Chronic lower back pain 03/05/2008   Meralgia paresthetica 12/13/2007   GERD (gastroesophageal reflux disease) 08/06/2007   HLD (hyperlipidemia) 05/03/2007   COPD (chronic obstructive pulmonary disease) 05/03/2007   PCP:  [name], MD Pharmacy:   [id number] [place], [place] - [address] AT [facility] [address] [facility] [address] [place] [address] Phone: [phone] Fax: [phone]  [facility] [place], [place] [address] [place] [address] Phone: [phone] Fax: [phone]     Social Drivers of Health (SDOH) Social History: SDOH Screenings   Food Insecurity: No Food Insecurity (04/02/2024)  Housing: Low Risk  (04/02/2024)  Transportation Needs: No Transportation Needs (04/02/2024)  Utilities: Not At  Risk (04/02/2024)  Alcohol  Screen: Low Risk  (11/03/2023)  Depression (PHQ2-9): Medium Risk (03/14/2024)  Financial Resource Strain: Low Risk  (11/03/2023)  Physical Activity: Unknown (11/03/2023)  Social Connections: Moderately Integrated (04/02/2024)  Stress: No Stress Concern Present (11/03/2023)  Tobacco Use: Medium Risk (04/04/2024)   SDOH Interventions:     Readmission Risk Interventions    04/03/2024    9:08 AM 03/22/2024   10:40 AM  Readmission Risk Prevention Plan  Transportation Screening Complete Complete  PCP or Specialist Appt within 5-7 Days  Complete  PCP or Specialist Appt within 3-5 Days Complete   Home Care Screening  Complete  Medication Review (RN CM)  Complete  HRI or Home Care Consult Complete   Social Work Consult for Recovery Care Planning/Counseling Complete   Palliative Care Screening Not Applicable   Medication Review Oceanographer) Complete

## 2024-04-04 NOTE — Progress Notes (Signed)
  Echocardiogram 2D Echocardiogram has been performed.  [name] 04/04/2024, 12:19 PM

## 2024-04-04 NOTE — Consult Note (Addendum)
 WOC Nurse re-consult Note: WOC consult requested for wounds which are bleeding through the dressings. Refer to previous WOC consult which was performed on 6/24.  Right arm full thickness skin tear is 6X4cm with a slow steady amt bleeding which has not decreased despite pressure. Left leg dressing is not bleeding through. Right leg full thickness skin tear is 3X2.5cm with a slow steady amt bleeding which has not decreased despite pressure.  Discussed plan of care with primary team; recommend surgical consult so they can apply Quick clot or another product which will stop the bleeding; WOC team does not have access to these types of products. Please refer to surgical team for further plan of care.  Please re-consult if further assistance is needed.  Diane Stephane Fought MSN, RN, CWOCN, [place], [place] [phone]  Reason for Consult:

## 2024-04-05 ENCOUNTER — Inpatient Hospital Stay (HOSPITAL_COMMUNITY)

## 2024-04-05 DIAGNOSIS — I340 Nonrheumatic mitral (valve) insufficiency: Secondary | ICD-10-CM | POA: Diagnosis not present

## 2024-04-05 DIAGNOSIS — I480 Paroxysmal atrial fibrillation: Secondary | ICD-10-CM | POA: Diagnosis not present

## 2024-04-05 DIAGNOSIS — J189 Pneumonia, unspecified organism: Secondary | ICD-10-CM | POA: Diagnosis not present

## 2024-04-05 DIAGNOSIS — I5033 Acute on chronic diastolic (congestive) heart failure: Secondary | ICD-10-CM | POA: Diagnosis not present

## 2024-04-05 LAB — CBC
HCT: 34.2 % — ABNORMAL LOW (ref 36.0–46.0)
Hemoglobin: 11 g/dL — ABNORMAL LOW (ref 12.0–15.0)
MCH: 35.6 pg — ABNORMAL HIGH (ref 26.0–34.0)
MCHC: 32.2 g/dL (ref 30.0–36.0)
MCV: 110.7 fL — ABNORMAL HIGH (ref 80.0–100.0)
Platelets: 63 10*3/uL — ABNORMAL LOW (ref 150–400)
RBC: 3.09 MIL/uL — ABNORMAL LOW (ref 3.87–5.11)
RDW: 13.1 % (ref 11.5–15.5)
WBC: 12.5 10*3/uL — ABNORMAL HIGH (ref 4.0–10.5)
nRBC: 0 % (ref 0.0–0.2)

## 2024-04-05 LAB — BASIC METABOLIC PANEL WITH GFR
Anion gap: 8 (ref 5–15)
BUN: 42 mg/dL — ABNORMAL HIGH (ref 8–23)
CO2: 34 mmol/L — ABNORMAL HIGH (ref 22–32)
Calcium: 9.8 mg/dL (ref 8.9–10.3)
Chloride: 94 mmol/L — ABNORMAL LOW (ref 98–111)
Creatinine, Ser: 0.99 mg/dL (ref 0.44–1.00)
GFR, Estimated: 58 mL/min — ABNORMAL LOW (ref >60)
Glucose, Bld: 111 mg/dL — ABNORMAL HIGH (ref 70–99)
Potassium: 4.3 mmol/L (ref 3.5–5.1)
Sodium: 136 mmol/L (ref 135–145)

## 2024-04-05 LAB — HEPARIN LEVEL (UNFRACTIONATED): Heparin Unfractionated: 0.85 [IU]/mL — ABNORMAL HIGH (ref 0.30–0.70)

## 2024-04-05 LAB — APTT: aPTT: 39 s — ABNORMAL HIGH (ref 24–36)

## 2024-04-05 MED ORDER — FENTANYL CITRATE PF 50 MCG/ML IJ SOSY
25.0000 ug | PREFILLED_SYRINGE | Freq: Once | INTRAMUSCULAR | Status: AC
  Administered 2024-04-05: 25 ug via INTRAVENOUS
  Filled 2024-04-05: qty 1

## 2024-04-05 MED ORDER — SODIUM CHLORIDE 0.9% FLUSH: 10.0000 mL | INTRAVENOUS | Status: DC | PRN

## 2024-04-05 MED ORDER — HEPARIN (PORCINE) 25000 UT/250ML-% IV SOLN
1000.0000 [IU]/h | INTRAVENOUS | Status: DC
  Administered 2024-04-05: 750 [IU]/h via INTRAVENOUS
  Filled 2024-04-05: qty 250

## 2024-04-05 MED ORDER — CHLORHEXIDINE GLUCONATE CLOTH 2 % EX PADS
6.0000 | MEDICATED_PAD | Freq: Every day | CUTANEOUS | Status: DC
  Administered 2024-04-06 – 2024-04-10 (×4): 6 via TOPICAL

## 2024-04-05 MED ORDER — FUROSEMIDE 10 MG/ML IJ SOLN
20.0000 mg | Freq: Two times a day (BID) | INTRAMUSCULAR | Status: DC
  Administered 2024-04-05 – 2024-04-07 (×4): 20 mg via INTRAVENOUS
  Filled 2024-04-05 (×4): qty 2

## 2024-04-05 NOTE — Progress Notes (Signed)
 Physical Therapy Treatment Patient Details Name: [name] MRN: [id number] DOB: 17-Jul-1946 Today's Date: 04/05/2024   History of Present Illness 78 y.o. female  who was recently discharged from [facility] on 6/13 after a stay for community-acquired pneumonia and combined COPD exacerbation being admitted to the hospital with recurrent acute hypoxic respiratory failure with worsening pneumonia. Pt with medical history significant for atrial fibrillation on Eliquis , anxiety, chronic diastolic congestive heart failure and COPD chronically.    PT Comments  AxO x 3 pleasant Lady who lives home alone and was just recently D/C from hospital. Pt already OOB in recliner.  Assisted to [facility] for void urgency.  General transfer comment: min A to power up and to steady, mildly unsteady with VC's on safety with turn completion and hand placement. General Gait Details: did not amb due to active bleeding R LE distal anterior.  Pt waiting on wound care nurse to address. Pt will need ST Rehab at SNF to address mobility and functional decline prior to safely returning home.    If plan is discharge home, recommend the following:     Can travel by private vehicle     Yes  Equipment Recommendations  None recommended by PT    Recommendations for Other Services       Precautions / Restrictions Precautions Precautions: Fall Precaution/Restrictions Comments: monitor O2, on 2L at home; fell just PTA     Mobility  Bed Mobility               General bed mobility comments: OOB in recliner    Transfers Overall transfer level: Needs assistance Equipment used: Rolling walker (2 wheels) Transfers: Sit to/from Stand Sit to Stand: Min assist           General transfer comment: min A to power up and to steady, mildly unsteady with VC's on safety with turn completion and hand placement.    Ambulation/Gait         Gait velocity: decreased     General Gait Details: did not amb due to active  bleeding R LE distal anterior.  Pt waiting on wound care nurse to address.   Stairs             Wheelchair Mobility     Tilt Bed    Modified Rankin (Stroke Patients Only)       Balance                                            Communication Communication Communication: Impaired Factors Affecting Communication: Hearing impaired  Cognition Arousal: Alert Behavior During Therapy: WFL for tasks assessed/performed                           PT - Cognition Comments: AxO x 3 pleasant Lady who lives home alone and was just recently D/C from hospital. Following commands: Intact      Cueing Cueing Techniques: Verbal cues  Exercises      General Comments        Pertinent Vitals/Pain Pain Assessment Pain Assessment: Faces Faces Pain Scale: Hurts a little bit Pain Location: low back Pain Descriptors / Indicators: Aching, Cramping, Discomfort, Squeezing, Sore Pain Intervention(s): Monitored during session, Repositioned    Home Living  Prior Function            PT Goals (current goals can now be found in the care plan section) Progress towards PT goals: Progressing toward goals    Frequency           PT Plan      Co-evaluation              AM-PAC PT 6 Clicks Mobility   Outcome Measure  Help needed turning from your back to your side while in a flat bed without using bedrails?: A Little Help needed moving from lying on your back to sitting on the side of a flat bed without using bedrails?: A Little Help needed moving to and from a bed to a chair (including a wheelchair)?: A Little Help needed standing up from a chair using your arms (e.g., wheelchair or bedside chair)?: A Lot Help needed to walk in hospital room?: A Lot Help needed climbing 3-5 steps with a railing? : A Lot 6 Click Score: 15    End of Session Equipment Utilized During Treatment: Gait belt Activity  Tolerance: Patient tolerated treatment well Patient left: in chair;with call bell/phone within reach;with family/visitor present;with chair alarm set   PT Visit Diagnosis: Unsteadiness on feet (R26.81);History of falling (Z91.81);Other abnormalities of gait and mobility (R26.89)     Time: 8852-8796 PT Time Calculation (min) (ACUTE ONLY): 16 min  Charges:    $Therapeutic Activity: 8-22 mins PT General Charges $$ ACUTE PT VISIT: 1 Visit                    Katheryn Leap  PTA Acute  Rehabilitation Services Office M-F          [phone]

## 2024-04-05 NOTE — Consult Note (Addendum)
 Cardiology Consultation   Patient ID: [name] MRN: [id number]; DOB: [birth year]  Admit date: 04/02/2024 Date of Consult: 04/05/2024  PCP:  [name], MD   [facility] Providers Cardiologist:  [name], MD       Patient Profile: [name] is a 78 y.o. 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  History of Present Illness: Ms. [name] is a 78 year old female with prior cardiac history listed below.  On 05/2020 she had leaflet thrombosis.  The leaflet thrombosis resolved after Xarelto  was switched to warfarin.  She had a lot of bruising on warfarin and difficulty keeping the INR in range.  Warfarin was changed to Eliquis  on 11/2020.  Her most recent TEE prior to this hospitalization showed an LVEF of 60 to 65%, normal RV function but mild RV enlargement, moderate biatrial dilation, and mild to moderate mitral stenosis with a mean gradient of 7 mmHg.  The gradient had improved from prior studies  She was most recently seen by Dr. Balding on 02/29/2024.  At that time she had a device check that showed an estimated 1 year of battery longevity, she had 75% atrial pacing and 98% ventricular pacing.  It showed that she had about 10 days of atrial fibrillation in April but spontaneously converted to normal sinus rhythm.    She has a remote history of rectal cancer and underwent a partial colonectomy.  Underwent a recent endoscopy and colonoscopy that showed no major pathology.  Had a bone marrow biopsy done  due to pancytopenia and macrocytosis but no serious pathology was identified  On 04/02/2024 patient presented to the emergency department with general malaise, a recent fall, and a cough.  On interview patient reported On 03/18/2024 she was hospitalized for a COPD exacerbation and was treated for bilateral basilar community-acquired pneumonia reported that after she went home started to feel weaker and had some dyspnea on exertion.  Also reported falling this past Tuesday stated that she got up and walked 2 steps prior to falling.   Chest x-ray showed New heterogeneous opacities overlying the right mid lower lung zones, compatible with pneumonia. Stable left retrocardiac airspace opacity, suggesting combination of left lung atelectasis and/or consolidation with pleural effusion.Patient was hospitalized and treated for bilateral pneumonia Anemia with a hemoglobin of 11.0, leukocytosis with blood count of 12.5, decreased platelets with a count of 63. paced rhythm with a rate of 74, LVH, IVCD. Similar to prior EKG  Transthoracic echo on 04/04/2024 showed an LVEF of 65 to 70%, moderate concentric LVH, moderately reduced RV systolic function, elevated RV systolic pressure, mildly dilated right atrium, severe mitral stenosis with a gradient of 13 millimeters of mercury, large pleural effusion, moderate tricuspid regurgitation, mild dilation of the ascending aorta at 42 mm, right atrial pressure was estimated to be 8 mmHg.  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 Known DISH with thoracic spine  fusion and moderate kyphosis, h/o L spine fusion L2-5   Adjustment disorder with depressed mood 2014-11-06   74 yo son died MI Oct 07, 2014 Husband died car accident 04/08/2015   Anemia    Anticoagulant  long-term use 06/14/2019   She takes warfarin for afib/flutter with CHADS2VASc score of 4 (age, sex, CHF, HTN), s/p 2 bioprosthetic valve replacements Coumadin  changed to eliquis  2022 per cardiology   Aortic atherosclerosis 03/03/2022   Noted on 01/2022 chest CT   Arthritis    Arthropathy of spinal facet joint 03/08/2018   Ascending aortic aneurysm 03/02/2021   4.1cm on CT 01/2021 rec yearly monitoring   Bilateral hip pain 09/08/2016   Cancer (HCC)    conon cancer 2012   CAP (community acquired pneumonia) 02/08/2016   Carotid stenosis 12/08/2020   Carotid US  - 40-59% BICA stenosis (2015) Carotid US  12/2020 - 1-39% BICA stenosis, incidental 3.4cm structure behind L common carotid artery rec further imaging    Cataracts, bilateral    immature   Chest pain 08/25/2014   myoview  low risk 02/2018 ([place])   Chronic cholecystitis with calculus 01/21/2015   S/p cholecystectomy    Chronic diastolic heart failure 09/19/2014   Chronic insomnia    Chronic lower back pain 03/05/2008   Returned to Dr Colon - translaminar ESI at L3/4. Myelogram showed significant left sided foraminal stenosis at L2/3 and L3/4 in setting of solid arthrodesis, planned DEXA and if stable osteopenia, considering anterolateral decompression with spacer.    CKD (chronic kidney disease) stage 2, GFR 60-89 ml/min 07/03/2015   Constipation    takes Miralax  daily as needed   COPD (chronic obstructive pulmonary disease)    Albuterol  inhaler daily as needed;Duoneb daily as needed;Spiriva  daily   COVID-19 virus infection 11/11/2022   DDD (degenerative disc disease)    cervical - kyphosis with mod DD changes C5/6 and C6/7; lumbar - early DD at L2/3 (Elsner)   Depression    DISH (diffuse idiopathic skeletal hyperostosis) 05/14/2019   By MRI noted fusion of lower thoracic vertebrae - likely contributing to unsteadiness (Elsner)   Dyspnea on exertion 06/05/2015   myoview  low risk 02/2018   Edema of both lower legs due to peripheral  venous insufficiency    Essential hypertension, benign 03/24/2009   hx of not on nmeds currenlty and has been a while since on meds per pt   Heart murmur    Hematuria 06/17/2021   History of kidney stones    History of radiation therapy 05/30/11 to 07/07/11   rectum   History of rectal cancer 05/26/2011   Rectal cancer 2012, midrectum [id number], s/p neoadj chemo&XRT, lap LAR with diverting loop ileostomy 08/2011, ileostomy takedown April 07, 2012 Discharged from oncology clinic 2017 Marquita)   History of shingles    History of vertebral fracture 11/26/2019   Old L1 vertebral compression fracture incidentally noted on imaging 2020    Hyperlipemia    takes Lovastatin  daily   Lacunar infarction    Left caudate   Left hip pain 07/29/2020   Legally blind in left eye    Lymphangioma 01/07/2021   Noted incidentally on carotid US  12/2020 CT neck - Likely benign lymphangioma 01/2021   Macrocytosis 03/27/2022   Folate and b12 checked 2023   Malaise and fatigue 01/22/2018   Meralgia paresthetica 12/13/2007   Mixed stress and urge urinary incontinence 03/16/2015   Failed oxybutynin  IR/ER. Vesicare  initially helped but then no longer helpful.  myrbetriq  was not effective.  Saw urology (MacDiarmid) - UDS largely  overactive bladder with mild-mod stress incontinence. Improved on abx course - maintained on macrodantin  and toviaz .    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   Pulmonary infiltrates 04/23/2019   06/13/2018-CT super D chest without contrast- generally stable chronic lung disease with pleural parenchymal scarring and scattered nodularity most of these nodules have been tree-in-bud in appearance likely postinfectious or inflammatory the dominant right lower lobe nodule seen on most recent study has resolved no new or enlarging nodules  04/22/2019-CT chest without contrast- waxing and waning per   Pulmonary nodule 02/23/2012   RLL nodule-2mm stable 2006, April 2009, and June 2009 Small pulm nodules Jan 2012 - > Oct 2012 without change   Rheumatic heart disease mitral stenosis    mod MS by echo 02/2014   Right leg swelling 06/11/2020   R venous US  06/2020 WNL: - No evidence of deep vein thrombosis in the lower extremity. No indirect evidence of obstruction proximal to the inguinal ligament. - No cystic structure found in the popliteal fossa.   S/P aortic and mitral valve bioprostheses 12/08/2013   AVR 21 mm, MVR 25 mm-MagnaEase pericardia   S/P AVR (aortic valve replacement) 2015   bioprosthetic (Bartle)   S/P MVR (mitral valve replacement) 2015   bioprosthetic (Bartle)   Sepsis secondary to UTI (HCC) 08/22/2017   Severe aortic valve stenosis 10/01/2013   mild-mod by echo 02/2014   Skin lesion 01/03/2022   Small bowel obstruction, partial 08/2013   reolved without NGT placement.    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   Dr Amy [place]   Syncope 05/28/2018   Thrombocytopenia 11/04/2019   Typical atrial flutter 05/15/2019   Vitamin B12 deficiency  02/28/2015   Start B12 shots 02/2015    Vitamin D  deficiency 10/23/2016   Wounds, multiple    bilateral legs    Past Surgical History:  Procedure Laterality Date   ANTERIOR LAT LUMBAR FUSION Left 07/17/2018   Procedure: Lumbar Two-Three Lumbar Three-Four Anterolateral decompression/interbody fusion with lateral plate fixation/Infuse;  Surgeon: Colon Shove, MD;  Location: [facility] OR;  Service: Neurosurgery;  Laterality: Left;  Lumbar Two-Three Lumbar Three-Four Anterolateral decompression/interbody fusion with lateral plate fixation/Infuse   AORTIC VALVE REPLACEMENT N/A 12/12/2013   Procedure: AORTIC VALVE REPLACEMENT (AVR);  Surgeon: Dorise MARLA Fellers, MD; Service: Open Heart Surgery   BACK SURGERY  2006, 2007   2006 SPACER, 2007 decompression and fusion L4/5   BOWEL RESECTION  04/02/2012   Procedure: SMALL BOWEL RESECTION;  Surgeon: Jina Nephew, MD;  Location: [facility]S;  Service: General;  Laterality: N/A;   BRONCHIAL WASHINGS  03/24/2021   Procedure: BRONCHIAL WASHINGS;  Surgeon: Geronimo Amel, MD;  Location: [facility] ENDOSCOPY;  Service: Endoscopy;;   CARDIOVERSION N/A 08/29/2023   Procedure: CARDIOVERSION (CATH LAB);  Surgeon: Pietro Redell RAMAN, MD;  Location: [facility] INVASIVE CV LAB;  Service: Cardiovascular;  Laterality: N/A;   CARPAL TUNNEL RELEASE Bilateral    CATARACT EXTRACTION W/ INTRAOCULAR LENS IMPLANT Left 10/18/2016   CATARACT EXTRACTION W/ INTRAOCULAR LENS IMPLANT Right 12/13/2016   Dr. Rosan   CHOLECYSTECTOMY N/A 01/21/2015   chronic cholecystitis, Jina Nephew, MD   COLON RESECTION  08/25/2011   Procedure: COLON RESECTION LAPAROSCOPIC;  Surgeon: Jina Nephew, MD;  Location: [facility];  Service: General;  Laterality: N/A;  Laparoscopic Assisted Low Anterior Resection Diverting Ostomy and onQ pain pump   COLONOSCOPY  03/2013   1 polyp, rpt 3 yrs Marianne)   COLONOSCOPY  05/2021   diverticulosis and patent colon anastomosis with some edema/narrowing, rpt 5 yrs Marianne)    COLONOSCOPY WITH PROPOFOL  N/A 06/09/2016   patent colo-colonic anastomosis, rpt 5 yrs Marianne)   COLONOSCOPY WITH PROPOFOL  N/A 01/25/2024   TA, fair prep (consider repeat if needed), patent rectal anastomosis with congestion edema and stenosis, mild sigmoid diverticulosis Zane Groom, MD)   CYSTOSCOPY/URETEROSCOPY/HOLMIUM LASER/STENT PLACEMENT Right 07/03/2023   Procedure: CYSTOSCOPY RIGHT RETROGRADE PYEPLGRAM RIGHT URETEROSCOPY/HOLMIUM LASER/STENT PLACEMENT;  Surgeon: Carolee Sherwood JONETTA DOUGLAS, MD;  Location: [facility];  Service: Urology;  Laterality: Right;  1 HR FOR CASE   CYSTOSCOPY/URETEROSCOPY/HOLMIUM LASER/STENT PLACEMENT Right 07/17/2023   Procedure: CYSTOSCOPY/RIGHT URETEROSCOPY/STENT EXCHANGE;  Surgeon: Carolee Sherwood JONETTA DOUGLAS, MD;  Location: [facility];  Service: Urology;  Laterality: Right;  60 MINS FOR CASE   EP IMPLANTABLE DEVICE N/A 06/16/2016   Procedure: Pacemaker Implant;  Surgeon: Will Gladis Norton, MD;  Location: [facility] INVASIVE CV LAB;  Service: Cardiovascular;  Laterality: N/A;   ESOPHAGOGASTRODUODENOSCOPY (EGD) WITH PROPOFOL  N/A 01/25/2024   diffuse gastritis benign biopsy Zane, Groom, MD)   FOOT SURGERY  left foot   hammer toe   ILEOSTOMY  08/25/2011   ILEOSTOMY CLOSURE  04/02/2012   Procedure: ILEOSTOMY TAKEDOWN;  Surgeon: Jina Nephew, MD;  Location: [facility];  Service: General;  Laterality: N/A;   INTRAOPERATIVE TRANSESOPHAGEAL ECHOCARDIOGRAM N/A 12/12/2013   Procedure: INTRAOPERATIVE TRANSESOPHAGEAL ECHOCARDIOGRAM;  Surgeon: Dorise MARLA Fellers, MD;  Location: [facility];  Service: Open Heart Surgery;  Laterality: N/A;   LEFT AND RIGHT HEART CATHETERIZATION WITH CORONARY ANGIOGRAM N/A 11/27/2013   Procedure: LEFT AND RIGHT HEART CATHETERIZATION WITH CORONARY ANGIOGRAM;  Surgeon: [name], MD;  Location: [facility] CATH LAB;  Service: Cardiovascular;  Laterality: N/A;   MITRAL VALVE REPLACEMENT N/A 12/12/2013   Procedure: MITRAL VALVE (MV) REPLACEMENT OR REPAIR;  Surgeon: Dorise MARLA Fellers, MD;  Service: Open Heart Surgery   POLYPECTOMY  01/25/2024   Procedure: POLYPECTOMY, INTESTINE;  Surgeon: Charlanne Groom, MD;  Location: [facility];  Service: Gastroenterology;;   TEE WITHOUT CARDIOVERSION N/A 11/29/2013   Procedure: TRANSESOPHAGEAL ECHOCARDIOGRAM (TEE);  Surgeon: Wilbert JONELLE Bihari, MD;  Location[facility] ENDOSCOPY;  Service: Cardiovascular;  Laterality: N/A;   TEE WITHOUT CARDIOVERSION N/A 06/04/2019   Procedure: TRANSESOPHAGEAL ECHOCARDIOGRAM (TEE);  Surgeon: Balding Jerel, MD;  Location[facility] ENDOSCOPY;  Service: Cardiovascular;  Laterality: N/A;   TONSILLECTOMY     TRANSESOPHAGEAL ECHOCARDIOGRAM (CATH LAB) N/A 08/29/2023   Procedure: TRANSESOPHAGEAL ECHOCARDIOGRAM;  Surgeon: Pietro Redell RAMAN, MD;  Location: [facility] INVASIVE CV LAB;  Service: Cardiovascular;  Laterality: N/A;   VIDEO BRONCHOSCOPY N/A 03/24/2021   Procedure: VIDEO BRONCHOSCOPY WITHOUT FLUORO;  Surgeon: Geronimo,  Dorethia, MD;  Location: [facility] ENDOS[facility];  Service: Endoscopy;  Laterality: N/A;  SMALL SCOPE,PREMEDICATE WITH NEBULIZED LIDOCAINE      Home Medications:  Prior to Admission medications   Medication Sig Start Date End Date Taking? Authorizing Provider  acetaminophen  (TYLENOL ) 500 MG tablet Take 2 tablets (1,000 mg total) by mouth in the morning and at bedtime. Patient taking differently: Take 1,000 mg by mouth daily. 02/05/24  Yes [name], MD  albuterol  (VENTOLIN  HFA) 108 [phone] Base) MCG/ACT inhaler Inhale 2 puffs into the lungs every 6 (six) hours as needed for wheezing. 06/05/23  Yes Geronimo Dorethia, MD  amoxicillin -clavulanate (AUGMENTIN ) 875-125 MG tablet Take 1 tablet by mouth 2 (two) times daily for 7 days. 03/29/24 04/05/24 Yes [name], MD  ascorbic acid  (VITAMIN C) 500 MG tablet Take 1 tablet (500 mg total) by mouth daily. 10/05/22  Yes [name], MD  Biotin 1000 MCG tablet Take 1,000 mcg by mouth daily.   Yes [provider]  buPROPion  (WELLBUTRIN  SR) 150 MG 12 hr tablet TAKE 1  TABLET(150 MG) BY MOUTH EVERY MORNING 03/07/24  Yes [name], MD  Cephalexin  250 MG tablet Take 250 mg by mouth daily with breakfast.   Yes [provider]  cholecalciferol (VITAMIN D3) 25 MCG (1000 UNIT) tablet Take 1,000 Units by mouth daily.   Yes [provider]  ELIQUIS  5 MG TABS tablet TAKE 1 TABLET(5 MG) BY MOUTH TWICE DAILY 03/25/24  Yes Croitoru, Mihai, MD  ferrous sulfate  325 (65 FE) MG EC tablet Take 1 tablet (325 mg total) by mouth daily with breakfast. 09/06/23  Yes Cloretta Arley NOVAK, MD  Fluticasone -Umeclidin-Vilant (TRELEGY ELLIPTA ) 200-62.5-25 MCG/ACT AEPB Inhale 1 puff into the lungs daily. 06/05/23  Yes Geronimo Dorethia, MD  furosemide  (LASIX ) 40 MG tablet TAKE 1 TABLET BY MOUTH EVERY DAY. MAY TAKE AN EXTRA TABLET AS NEEDED FOR SWELLING Patient taking differently: Take 40 mg by mouth in the morning. 08/24/23  Yes Croitoru, Mihai, MD  gabapentin  (NEURONTIN ) 100 MG capsule Take 1 capsule (100 mg total) by mouth 2 (two) times daily as needed (back pain - for daytime use). Patient taking differently: Take 100 mg by mouth daily as needed (back pain - for daytime use). 04/24/23  Yes [name], MD  gabapentin  (NEURONTIN ) 300 MG capsule Take 300 mg by mouth at bedtime.   Yes [provider]  lisinopril  (ZESTRIL ) 5 MG tablet Take 5 mg by mouth daily.   Yes [provider]  lovastatin  (MEVACOR ) 40 MG tablet TAKE 1 TABLET(40 MG) BY MOUTH AT BEDTIME Patient taking differently: Take 40 mg by mouth in the morning. 10/23/23  Yes [name], MD  metoprolol  succinate (TOPROL  XL) 25 MG 24 hr tablet Take 1 tablet (25 mg total) by mouth every evening. Patient taking differently: Take 25 mg by mouth daily. 09/11/23  Yes Ollis, Brandi L, NP  montelukast  (SINGULAIR ) 10 MG tablet TAKE 1 TABLET(10 MG) BY MOUTH AT BEDTIME Patient taking differently: Take 10 mg by mouth daily. 03/25/24  Yes Geronimo Dorethia, MD  Multiple Vitamins-Minerals (PRESERVISION  AREDS 2 PO) Take 2 capsules by mouth in the morning.   Yes [provider]  omeprazole  (PRILOSEC) 20 MG capsule Take 20 mg by mouth daily.   Yes [provider]  potassium chloride  (KLOR-CON ) 10 MEQ tablet TAKE 2 TABLETS(20 MEQ) BY MOUTH DAILY 02/09/24  Yes Croitoru, Mihai, MD  traZODone  (DESYREL ) 50 MG tablet Take 1 tablet (50 mg total) by mouth at bedtime. 03/29/24  Yes Rilla,  Anton, MD  Vibegron  (GEMTESA ) 75 MG TABS Take 75 mg by mouth daily.   Yes [provider]  vitamin B-12 (CYANOCOBALAMIN ) 500 MCG tablet Take 500 mcg by mouth 2 (two) times a week.   Yes [provider]  Zinc  50 MG CAPS Take 1 capsule (50 mg total) by mouth daily. 11/04/19  Yes Rilla Anton, MD  Ensifentrine  (OHTUVAYRE ) 3 MG/2.5ML SUSP Inhale 3 mg into the lungs in the morning and at bedtime. Twice daily via neb Patient not taking: Reported on 04/03/2024    [provider]  hydrocortisone  (ANUSOL -HC) 2.5 % rectal cream APPLY RECTALLY TO THE AFFECTED AREA TWICE DAILY Patient not taking: Reported on 04/03/2024 12/27/23   Rilla Anton, MD  ondansetron  (ZOFRAN ) 4 MG tablet Take 1 tablet (4 mg total) by mouth every 8 (eight) hours as needed for nausea or vomiting. Patient not taking: Reported on 04/03/2024 03/14/24   Rilla Anton, MD  predniSONE  (DELTASONE ) 20 MG tablet Take by mouth. Patient not taking: Reported on 04/03/2024    [provider]  traMADol  (ULTRAM ) 50 MG tablet Take 1-2 tablets (50-100 mg total) by mouth 2 (two) times daily as needed for moderate pain (pain score 4-6). Patient not taking: Reported on 04/03/2024 02/06/24   Rilla Anton, MD    Scheduled Meds:  budesonide -glycopyrrolate -formoterol   2 puff Inhalation BID   buPROPion   150 mg Oral Daily   feeding supplement  237 mL Oral BID BM   furosemide   20 mg Intravenous BID   methylPREDNISolone  (SOLU-MEDROL ) injection  40 mg Intravenous Q12H   metoprolol  succinate  25 mg Oral QHS    montelukast   10 mg Oral QHS   pravastatin   40 mg Oral q1800   traZODone   50 mg Oral QHS   Continuous Infusions:  ceFEPime  (MAXIPIME ) IV 2 g (04/05/24 1003)   metronidazole  500 mg (04/04/24 2320)   PRN Meds: acetaminophen  **OR** acetaminophen , albuterol , guaiFENesin -dextromethorphan , ondansetron  **OR** ondansetron  (ZOFRAN ) IV, traMADol , traZODone   Allergies:    Allergies  Allergen Reactions   Tape Other (See Comments)    THICK TAPES PULLS OFF MY SKIN   Codeine Nausea Only   Prednisone  Nausea Only   Sulfa Antibiotics Nausea Only    Social History:   Social History   Socioeconomic History   Marital status: Widowed    Spouse name: Not on file   Number of children: 2   Years of education: 55   Highest education level: 12th grade  Occupational History   Occupation: Retired  Tobacco Use   Smoking status: Former    Current packs/day: 0.00    Average packs/day: 1.5 packs/day for 10.0 years (15.0 ttl pk-yrs)    Types: Cigarettes    Start date: 12/27/1964    Quit date: 12/28/1974    Years since quitting: 49.3   Smokeless tobacco: Never   Tobacco comments:    quit smoking in 1976  Vaping Use   Vaping status: Never Used  Substance and Sexual Activity   Alcohol  use: Never    Alcohol /week: 2.0 standard drinks of alcohol     Types: 2 Glasses of wine per week   Drug use: No   Sexual activity: Not Currently  Other Topics Concern   Not on file  Social History Narrative   Widow. Husband died 2015-03-18 MVA. 1 cat.    2 sons; 1 grandchild. One son died of MI 65   Edu: Completed HS, some college   Married 1966   Retired-[employer], volunteers at [employer]   Activity: pulm  rehab   Diet: some water , fruits/vegetables daily      Right handed   Caffeine: 4 glasses of tea and 1 diet coke a day   Social Drivers of Corporate investment banker Strain: Low Risk  (11/03/2023)   Overall Financial Resource Strain (CARDIA)    Difficulty of Paying Living Expenses: Not hard at all  Food Insecurity:  No Food Insecurity (04/02/2024)   Hunger Vital Sign    Worried About Running Out of Food in the Last Year: Never true    Ran Out of Food in the Last Year: Never true  Transportation Needs: No Transportation Needs (04/02/2024)   PRAPARE - Administrator, Civil Service (Medical): No    Lack of Transportation (Non-Medical): No  Physical Activity: Unknown (11/03/2023)   Exercise Vital Sign    Days of Exercise per Week: 0 days    Minutes of Exercise per Session: Not on file  Stress: No Stress Concern Present (11/03/2023)   [employer] of Occupational Health - Occupational Stress Questionnaire    Feeling of Stress : Only a little  Social Connections: Moderately Integrated (04/02/2024)   Social Connection and Isolation Panel    Frequency of Communication with Friends and Family: More than three times a week    Frequency of Social Gatherings with Friends and Family: Once a week    Attends Religious Services: 1 to 4 times per year    Active Member of [employer] or Organizations: Yes    Attends Banker Meetings: 1 to 4 times per year    Marital Status: Widowed  Intimate Partner Violence: Not At Risk (04/02/2024)   Humiliation, Afraid, Rape, and Kick questionnaire    Fear of Current or Ex-Partner: No    Emotionally Abused: No    Physically Abused: No    Sexually Abused: No    Family History:    Family History  Problem Relation Age of Onset   Heart disease Mother    Diabetes Mother    Hypertension Mother    Stroke Mother    Lung cancer Father    Breast cancer Maternal Aunt 42   Heart attack Maternal Grandfather    CAD Son 27   Heart attack Son 47   Rectal cancer Neg Hx    Stomach cancer Neg Hx    Esophageal cancer Neg Hx    Colon cancer Neg Hx      ROS:  Please see the history of present illness.   All other ROS reviewed and negative.     Physical Exam/Data: Vitals:   04/04/24 2147 04/04/24 2208 04/05/24 0554 04/05/24 0827  BP: 132/62 126/63 136/72    Pulse: 70 73 74   Resp:  18 16   Temp:  97.7 F (36.5 C) (!) 97.5 F (36.4 C)   TempSrc:  Oral    SpO2:  99% 98% 97%  Weight:      Height:        Intake/Output Summary (Last 24 hours) at 04/05/2024 1040 Last data filed at 04/05/2024 0600 Gross per 24 hour  Intake 620.03 ml  Output --  Net 620.03 ml      04/02/2024   10:12 AM 03/29/2024   11:11 AM 03/19/2024    3:58 AM  Last 3 Weights  Weight (lbs) 141 lb 1.5 oz 142 lb 7 oz 143 lb 4.8 oz  Weight (kg) 64 kg 64.609 kg 65 kg     Body mass index is 24.22 kg/m.  General: Elderly frail female appearing older than stated age has multiple bruises from falls HEENT: normal Neck: no JVD Vascular: No carotid bruits; Distal pulses 2+ bilaterally Cardiac:  normal S1, S2; RRR; no murmur  Lungs:  clear to auscultation bilaterally, no wheezing, rhonchi or rales  Abd: soft, nontender, no hepatomegaly  Ext: no edema Musculoskeletal:  No deformities, BUE and BLE strength normal and equal Skin: warm and dry  Neuro:  CNs 2-12 intact, no focal abnormalities noted Psych:  Normal affect   EKG:  The EKG was personally reviewed and demonstrates:   Telemetry:  Telemetry was personally reviewed and demonstrates:    Relevant CV Studies: TTE this hospitalization  IMPRESSIONS     1. Left ventricular ejection fraction, by estimation, is 65 to 70%. The  left ventricle has normal function. The left ventricle has no regional  wall motion abnormalities. There is moderate concentric left ventricular  hypertrophy. Left ventricular  diastolic parameters are indeterminate.   2. Right ventricular systolic function is moderately reduced. The right  ventricular size is mildly enlarged. There is severely elevated pulmonary  artery systolic pressure. The estimated right ventricular systolic  pressure is 61.9 mmHg.   3. Right atrial size was mildly dilated.   4. The mitral valve has been repaired/replaced. No evidence of mitral  valve regurgitation.  Severe mitral stenosis. The mean mitral valve  gradient is 13.0 mmHg. There is a 25 mm bioprosthetic valve present in the  mitral position. Procedure Date:  12/08/2013. Echo findings are consistent with stenosis the mitral  prosthesis.   5. Large pleural effusion.   6. Tricuspid valve regurgitation is moderate.   7. The aortic valve has been repaired/replaced. Aortic valve  regurgitation is not visualized. Aortic valve sclerosis is present, with  no evidence of aortic valve stenosis. There is a 21 mm Magna Ease valve  present in the aortic position. Procedure Date:   12/08/2013.   8. Aortic dilatation noted. There is mild dilatation of the ascending  aorta, measuring 42 mm.   9. The inferior vena cava is dilated in size with >50% respiratory  variability, suggesting right atrial pressure of 8 mmHg.   Laboratory Data: High Sensitivity Troponin:   Recent Labs  Lab 03/18/24 2247 03/19/24 0002 04/02/24 1050 04/02/24 1833  TROPONINIHS 64* 76* 86* 78*     Chemistry Recent Labs  Lab 04/02/24 1050 04/03/24 0418 04/04/24 0357  NA 140 134* 133*  K 3.8 4.6 4.2  CL 93* 92* 94*  CO2 36* 31 28  GLUCOSE 114* 124* 160*  BUN 31* 36* 47*  CREATININE 1.12* 1.07* 1.06*  CALCIUM  9.3 9.0 9.2  MG 2.1  --   --   GFRNONAA 50* 53* 54*  ANIONGAP 11 11 11     Recent Labs  Lab 04/02/24 1050  PROT 6.9  ALBUMIN  2.8*  AST 17  ALT 12  ALKPHOS 74  BILITOT 0.7   Lipids No results for input(s): CHOL, TRIG, HDL, LABVLDL, LDLCALC, CHOLHDL in the last 168 hours.  Hematology Recent Labs  Lab 04/02/24 1050 04/03/24 0418 04/04/24 0357  WBC 11.5* 9.8 11.8*  RBC 3.43* 3.16* 2.79*  HGB 12.3 11.7* 10.7*  HCT 38.2 35.9* 31.4*  MCV 111.4* 113.6* 112.5*  MCH 35.9* 37.0* 38.4*  MCHC 32.2 32.6 34.1  RDW 13.4 13.2 13.2  PLT 81* 72* 59*   Thyroid   Recent Labs  Lab 04/02/24 1050  TSH 1.395    BNP Recent Labs  Lab 04/02/24 1206  BNP 365.5*  DDimer No results for input(s):  DDIMER in the last 168 hours.  Radiology/Studies:  ECHOCARDIOGRAM COMPLETE Result Date: 04/04/2024    ECHOCARDIOGRAM REPORT   Patient Name:   ADAMARI FREDE Date of Exam: 04/04/2024 Medical Rec #:  [id number]            Height:       64.0 in Accession #:    [id number]           Weight:       141.1 lb Date of Birth:  [birth year]            BSA:          1.687 m Patient Age:    33 years             BP:           139/63 mmHg Patient Gender: F                    HR:           70 bpm. Exam Location:  Inpatient Procedure: 2D Echo, Cardiac Doppler and Color Doppler (Both Spectral and Color            Flow Doppler were utilized during procedure). Indications:    R06.02 SOB  History:        Patient has prior history of Echocardiogram examinations, most                 recent 08/29/2023. Abnormal ECG and Pacemaker, COPD, Aortic                 Valve Disease and Mitral Valve Disease, Arrythmias:Atrial                 Fibrillation, Signs/Symptoms:Chest Pain, Shortness of Breath,                 Dyspnea, Edema and Syncope; Risk Factors:Hypertension and                 Dyslipidemia. Cancer. PFO. MVR. AVR.                 Aortic Valve: 21 mm Magna Ease valve is present in the aortic                 position. Procedure Date: 12/08/2013.                 Mitral Valve: 25 mm bioprosthetic valve valve is present in the                 mitral position. Procedure Date: 12/08/2013.  Sonographer:    Ellouise Mose RDCS Referring Phys: [id number] JESSICA [facility]  Sonographer Comments: Technically difficult study due to poor echo windows. Patient could not lie back. IMPRESSIONS  1. Left ventricular ejection fraction, by estimation, is 65 to 70%. The left ventricle has normal function. The left ventricle has no regional wall motion abnormalities. There is moderate concentric left ventricular hypertrophy. Left ventricular diastolic parameters are indeterminate.  2. Right ventricular systolic function is moderately reduced. The right ventricular size  is mildly enlarged. There is severely elevated pulmonary artery systolic pressure. The estimated right ventricular systolic pressure is 61.9 mmHg.  3. Right atrial size was mildly dilated.  4. The mitral valve has been repaired/replaced. No evidence of mitral valve regurgitation. Severe mitral stenosis. The mean mitral valve gradient is 13.0 mmHg. There is a 25 mm bioprosthetic valve present in the mitral position. Procedure Date:  12/08/2013. Echo findings are consistent with stenosis the mitral prosthesis.  5. Large pleural effusion.  6. Tricuspid valve regurgitation is moderate.  7. The aortic valve has been repaired/replaced. Aortic valve regurgitation is not visualized. Aortic valve sclerosis is present, with no evidence of aortic valve stenosis. There is a 21 mm Magna Ease valve present in the aortic position. Procedure Date:  12/08/2013.  8. Aortic dilatation noted. There is mild dilatation of the ascending aorta, measuring 42 mm.  9. The inferior vena cava is dilated in size with >50% respiratory variability, suggesting right atrial pressure of 8 mmHg. FINDINGS  Left Ventricle: Left ventricular ejection fraction, by estimation, is 65 to 70%. The left ventricle has normal function. The left ventricle has no regional wall motion abnormalities. The left ventricular internal cavity size was small. There is moderate  concentric left ventricular hypertrophy. Left ventricular diastolic function could not be evaluated due to mitral valve replacement. Left ventricular diastolic parameters are indeterminate. Right Ventricle: The right ventricular size is mildly enlarged. No increase in right ventricular wall thickness. Right ventricular systolic function is moderately reduced. There is severely elevated pulmonary artery systolic pressure. The tricuspid regurgitant velocity is 3.67 m/s, and with an assumed right atrial pressure of 8 mmHg, the estimated right ventricular systolic pressure is 61.9 mmHg. Left Atrium: Left  atrial size was not well visualized. Right Atrium: Right atrial size was mildly dilated. Pericardium: There is no evidence of pericardial effusion. Mitral Valve: The mitral valve has been repaired/replaced. No evidence of mitral valve regurgitation. There is a 25 mm bioprosthetic valve present in the mitral position. Procedure Date: 12/08/2013. Echo findings are consistent with stenosis of the mitral prosthesis. Severe mitral valve stenosis. MV peak gradient, 22.5 mmHg. The mean mitral valve gradient is 13.0 mmHg. Tricuspid Valve: The tricuspid valve is normal in structure. Tricuspid valve regurgitation is moderate . No evidence of tricuspid stenosis. Aortic Valve: Acceleration time borderline, likely normal valve function. The aortic valve has been repaired/replaced. Aortic valve regurgitation is not visualized. Aortic valve sclerosis is present, with no evidence of aortic valve stenosis. Aortic valve mean gradient measures 13.0 mmHg. Aortic valve peak gradient measures 24.1 mmHg. Aortic valve area, by VTI measures 1.32 cm. There is a 21 mm Magna Ease valve present in the aortic position. Procedure Date: 12/08/2013. Pulmonic Valve: The pulmonic valve was normal in structure. Pulmonic valve regurgitation is mild. No evidence of pulmonic stenosis. Aorta: The aortic root is normal in size and structure and aortic dilatation noted. There is mild dilatation of the ascending aorta, measuring 42 mm. Venous: The inferior vena cava is dilated in size with greater than 50% respiratory variability, suggesting right atrial pressure of 8 mmHg. IAS/Shunts: The interatrial septum was not well visualized. Additional Comments: There is a large pleural effusion.  LEFT VENTRICLE PLAX 2D LVIDd:         3.70 cm     Diastology LVIDs:         2.60 cm     LV e' medial:    3.26 cm/s LV PW:         2.10 cm     LV E/e' medial:  33.3 LV IVS:        1.20 cm     LV e' lateral:   4.90 cm/s LVOT diam:     2.10 cm     LV E/e' lateral: 22.2 LV SV:          59 LV SV Index:   35 LVOT  Area:     3.46 cm  LV Volumes (MOD) LV vol d, MOD A2C: 64.2 ml LV vol d, MOD A4C: 48.8 ml LV vol s, MOD A2C: 12.4 ml LV vol s, MOD A4C: 19.1 ml LV SV MOD A2C:     51.8 ml LV SV MOD A4C:     48.8 ml LV SV MOD BP:      40.8 ml RIGHT VENTRICLE            IVC RV S prime:     5.11 cm/s  IVC diam: 2.60 cm TAPSE (M-mode): 1.2 cm LEFT ATRIUM             Index        RIGHT ATRIUM           Index LA diam:        3.00 cm 1.78 cm/m   RA Area:     18.30 cm LA Vol (A2C):   59.0 ml 34.98 ml/m  RA Volume:   55.30 ml  32.78 ml/m LA Vol (A4C):   37.4 ml 22.17 ml/m LA Biplane Vol: 47.3 ml 28.04 ml/m  AORTIC VALVE                     PULMONIC VALVE AV Area (Vmax):    1.24 cm      PR End Diast Vel: 1.70 msec AV Area (Vmean):   1.20 cm AV Area (VTI):     1.32 cm AV Vmax:           245.67 cm/s AV Vmean:          169.667 cm/s AV VTI:            0.443 m AV Peak Grad:      24.1 mmHg AV Mean Grad:      13.0 mmHg LVOT Vmax:         87.90 cm/s LVOT Vmean:        58.900 cm/s LVOT VTI:          0.169 m LVOT/AV VTI ratio: 0.38  AORTA Ao Root diam: 3.20 cm Ao Asc diam:  4.07 cm MITRAL VALVE                TRICUSPID VALVE MV Area (PHT): 2.24 cm     TR Peak grad:   53.9 mmHg MV Area VTI:   0.80 cm     TR Vmax:        367.00 cm/s MV Peak grad:  22.5 mmHg MV Mean grad:  13.0 mmHg    SHUNTS MV Vmax:       2.37 m/s     Systemic VTI:  0.17 m MV Vmean:      168.0 cm/s   Systemic Diam: 2.10 cm MV Decel Time: 338 msec MV E velocity: 108.56 cm/s MV A velocity: 220.00 cm/s MV E/A ratio:  0.49 Morene Brownie Electronically signed by Morene Brownie Signature Date/Time: 04/04/2024/12:26:32 PM    Final    DG ESOPHAGUS W SINGLE CM (SOL OR THIN BA) Result Date: 04/03/2024 CLINICAL DATA:  Dysphagia. Recurrent phlegm in throat, which patient is unable to swallow. EXAM: ESOPHAGUS/BARIUM SWALLOW STUDY TECHNIQUE: Single contrast esophagram was performed using thin liquid barium. Exam was technically limited by patient's frail  physical condition, as she was unable to stand erect, lay supine, or roll on fluoroscopy table. Exam was limited to LPO and RPO projections in semi-erect position. FLUOROSCOPY: Radiation Exposure Index (as provided by the fluoroscopic device):  33.1 mGy Kerma COMPARISON:  None Available. FINDINGS: No evidence of esophageal obstruction, constricting mass, or stricture. Elevated left hemidiaphragm noted. No hiatal hernia seen. No gastroesophageal reflux demonstrated on limited exam. IMPRESSION: Technically limited exam. No evidence of esophageal stricture or hiatal hernia. Electronically Signed   By: Norleen DELENA Kil [name]   On: 04/03/2024 15:52   DG Chest 2 View Result Date: 04/02/2024 CLINICAL DATA:  recent PNA and CHF, worsening oxygen requirement, cough, SOB, wheeze, ronchi and large rales, peripheral edema. EXAM: CHEST - 2 VIEW COMPARISON:  03/21/2024. FINDINGS: Re-demonstration of left retrocardiac airspace opacity obscuring the left hemidiaphragm, descending thoracic aorta and blunting the left lateral costophrenic angle, suggesting combination of left lung atelectasis and/or consolidation with pleural effusion. No significant interval change. There are new heterogeneous opacities overlying the right mid lower lung zones without volume loss, compatible with pneumonia. There is trace right pleural effusion, grossly unchanged since the prior study. Redemonstration of right apical pleural cap, unchanged. Redemonstration of chronic interstitial predominance, similar to the prior study. No frank pulmonary edema. Stable cardio-mediastinal silhouette. There is a left sided 2-lead pacemaker. 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 No acute osseous abnormalities. The soft tissues are within normal limits. IMPRESSION: 1. New heterogeneous opacities overlying the right mid lower lung zones, compatible  with pneumonia. 2. Stable left retrocardiac airspace opacity, suggesting combination of left lung atelectasis and/or consolidation with pleural effusion. 3. Stable trace right pleural effusion. Electronically Signed   By: Ree Molt [name]   On: 04/02/2024 12:45   CT Head Wo Contrast Result Date: 04/02/2024 CLINICAL DATA:  Head trauma, minor (Age >= 65y) Head trauma, moderate-severe fall on thinners, sleepy EXAM: CT HEAD WITHOUT CONTRAST TECHNIQUE: Contiguous axial images were obtained from the base of the skull through the vertex without intravenous contrast. RADIATION DOSE REDUCTION: This exam was performed according to the departmental dose-optimization program which includes automated exposure control, adjustment of the mA and/or kV according to patient size and/or use of iterative reconstruction technique. COMPARISON:  CT of the head dated October 09, 2022. FINDINGS: Brain: Chronic lacunar infarct within the left caudate nucleus. Mild periventricular white matter disease. No evidence of hemorrhage, mass, acute cortical infarct or hydrocephalus. Vascular: Moderate calcific atheromatous disease within the carotid siphons and vertebral arteries. Skull: Intact and unremarkable. Sinuses/Orbits: Status post bilateral lens replacement. Paranasal sinuses are clear. Other: Negative. IMPRESSION: 1. No evidence of acute traumatic injury. 2. Stable chronic ischemic small vessel disease. Electronically Signed   By: Evalene Coho [name]   On: 04/02/2024 12:25     Assessment and Plan: Tyrihanna Shelbee Apgar is a 78 y.o. 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  After being discharged home she had worsening fatigue, cough, and a fall.  On exam she appears frail and has a swollen right arm from her fall. Most recent  TEE was done on 08/2023 and showed an LVEF of 60 to 65%, normal RV function but mild RV enlargement, moderate biatrial dilation, and mild to moderate mitral stenosis with a mean gradient of 7 mmHg.  The gradient had improved from prior studies Transthoracic echo on 04/04/2024 showed an LVEF of 65 to 70%, moderate concentric LVH, moderately reduced RV systolic function, elevated RV systolic pressure, mildly dilated right atrium, severe mitral stenosis with a gradient of 13 millimeters of mercury, large pleural effusion, moderate tricuspid regurgitation, mild dilation of the ascending aorta at 42 mm, right atrial pressure was estimated to be 8 mmHg. She was previously on warfarin but was transition to Eliquis  after having difficulty keeping INR at goal.  Continue 20 mg IV Lasix  twice daily With frailty and thrombocytopenia she appears to be a poor candidate for a TEE or an intervention  Thrombocytopenia Most recent platelet count on 04/05/2024 showed a platelet count of 63. Had a bone marrow biopsy done due to pancytopenia and macrocytosis but no serious pathology was identified  Paroxysmal atrial fibrillation CHA2DS2-VASc Score = 6 [CHF History: 1, HTN History: 1, Diabetes History: 0, Stroke History: 0, Vascular Disease History: 1, Age Score: 2, Gender Score: 1].  Therefore, the patient's annual risk of stroke is 9.7 %.    Continue IV heparin  Plan to transition back to Eliquis  prior to discharge  Complete heart block s/p Saint Jude Assurity dual-chamber pacemaker in 2017  (MRI conditional) She was most recently seen by Dr. Francyne on 02/29/2024.  At that time she had a device check that showed an estimated 1 year of battery longevity, she had 75% atrial pacing and 98% ventricular pacing.  It showed that she had about 10 days of atrial fibrillation in April but spontaneously converted to normal sinus rhythm  Otherwise manage per primary    Risk Assessment/Risk Scores:         CHA2DS2-VASc Score = 6   This indicates a 9.7% annual risk of stroke. The patient's score is based upon: CHF History: 1 HTN History: 1 Diabetes History: 0 Stroke History: 0 Vascular Disease History: 1 Age Score: 2 Gender Score: 1        For questions or updates, please contact [facility] Please consult [email or web address] for contact info under    Signed, Morse Clause, PA-C  04/05/2024 10:40 AM  Personally seen and examined. Agree with above.  78 year old with chronic diastolic heart failure, both aortic valve and mitral valve replacement in 2015, pacemaker, here with malaise, substantial fall where she tripped and fell against her fireplace brick and degloved her right arm.  She has been having some dyspnea on exertion, chest x-ray with opacities overlying the right middle and lower lung fields.  At first thought to be pneumonia but possibly edema.  An echo was performed and showed an increase in transmitral gradient of her bioprosthetic valve from 7 mmHg to 13 mmHg.  Most recent transesophageal echocardiogram a few months back showed a mean gradient of 7 mmHg.  Currently she is sitting in the chair, comfortable.  Does not look short of breath at rest.  Plan  Acute on chronic diastolic heart failure with bioprosthetic mitral and aortic valves. -Increased gradient mitral valve from 7-13.  This could definitely correlate with some of her symptomatology.  Unfortunately, she is not a surgical candidate.  She is continuing with anticoagulation, currently on heparin  IV.   She also has her arms wrapped tightly with Ace wrap secondary to ongoing bleeding from  her degloved forearm.  Surgical team will reevaluate as her right hand is cold and she is having trouble moving her fingers.  She continues with IV antibiotics.  I think makes sense for us  to continue with the furosemide  20 mg IV twice daily for now.  Monitor her creatinine.  Thrombocytopenia is also present with current platelet count of 63,000.  I would not want to perform TEE at this juncture to reevaluate her mitral valve.  Risk of bleeding is increased.  We will continue to follow.  Oneil Parchment, MD

## 2024-04-05 NOTE — Progress Notes (Signed)
 PROGRESS NOTE    [name]  FMW:[id number] DOB: 02/25/1946 DOA: 04/02/2024 PCP: [name], MD    Brief Narrative:  [name] is a 78 y.o. female with medical history significant for atrial fibrillation on Eliquis , anxiety, chronic diastolic congestive heart failure and COPD chronically on room air who was recently discharged from [facility] on 6/13 after a stay for community-acquired pneumonia and combined COPD exacerbation being admitted to the hospital with recurrent acute hypoxic respiratory failure with worsening pneumonia.  History is provided by the patient as well as her granddaughter who is at the bedside, they state that she was discharged home on 6/13 on 2 L nasal cannula oxygen.  She completed a course of oral antibiotics after discharge.  She was doing well at home, had remained on supplemental oxygen.  However over the last 2 to 3 days, patient and family noticed that she developed more of a wet cough, felt more dyspnea with exertion, weakness and unsteadiness on her feet.  She fell at home yesterday and tore up the skin on her right arm.  Initially patient was thought to have a recurrent pneumonia but echo shows concern for issues with worsening valve function so wonder if patient has pleural effusions instead of pneumonia.   Assessment and Plan: Doubt bilateral pneumonia and instead think that patient has pleural effusions from heart failure -Procalcitonin negative - Will use IV Lasix  and monitor ins and outs - Cardiology consult - Chest x-ray and consideration of thoracentesis pending results (will continue antibiotics until thoracentesis done and fluid sent)  Acute hypoxic respiratory failure - Wean O2 as able-discharged last admission on 2 L   oozing wound on right arm - Wound care - Consulted general surgery as oozing continue despite pressure dressings-unable to hold blood thinner due to valvular concerns  COPD-with evidence of mild  exacerbation -Wean off steroids -Albuterol  as needed -Incentive spirometer and flutter valve incentive   Heart failure with preserved EF but mitral valve appears to have worsened from prior: Severe mitral stenosis. The mean mitral valve gradient is 13.0 mmHg.  -Continue beta-blocker - IV Lasix    Atrial fibrillation-continue metoprolol   -Hold Eliquis  for continued bleeding of her arm- heparin  gtt   Hypertension- -hold home lisinopril  due to AKI   Acute kidney injury-presumably due to her recent hospitalizations, feeling poorly and reduced p.o. intake. -Hold lisinopril  - Creatinine stable   Troponin-likely mild leak in the setting of acute infection - Stable   Thrombocytopenia - Trending down - Monitor closely  DVT prophylaxis:     Code Status: Full Code Granddaughter at bedside  Disposition Plan:  Level of care: Progressive Status is: Inpatient   Appears patient is agreeable to go to rehab for short-term placement    Consultants:  Cardiology General Surgery   Subjective: Says she is not feeling any better since admission  Objective: Vitals:   04/04/24 2147 04/04/24 2208 04/05/24 0554 04/05/24 0827  BP: 132/62 126/63 136/72   Pulse: 70 73 74   Resp:  18 16   Temp:  97.7 F (36.5 C) (!) 97.5 F (36.4 C)   TempSrc:  Oral    SpO2:  99% 98% 97%  Weight:      Height:        Intake/Output Summary (Last 24 hours) at 04/05/2024 1223 Last data filed at 04/05/2024 0600 Gross per 24 hour  Intake 620.03 ml  Output --  Net 620.03 ml   Filed Weights   04/02/24 1012  Weight: 64  kg    Examination:   General: Appearance:  Elderly female in no acute distress     Lungs:   Coarse breath sounds, on nasal cannula, respirations unlabored  Heart:    Normal heart rate.    MS:   All extremities are intact.    Neurologic:   Awake, alert       Data Reviewed: I have personally reviewed following labs and imaging studies  CBC: Recent Labs  Lab 04/02/24 1050  04/03/24 0418 04/04/24 0357 04/05/24 0935  WBC 11.5* 9.8 11.8* 12.5*  NEUTROABS 10.3*  --   --   --   HGB 12.3 11.7* 10.7* 11.0*  HCT 38.2 35.9* 31.4* 34.2*  MCV 111.4* 113.6* 112.5* 110.7*  PLT 81* 72* 59* 63*   Basic Metabolic Panel: Recent Labs  Lab 04/02/24 1050 04/03/24 0418 04/04/24 0357 04/05/24 0935  NA 140 134* 133* 136  K 3.8 4.6 4.2 4.3  CL 93* 92* 94* 94*  CO2 36* 31 28 34*  GLUCOSE 114* 124* 160* 111*  BUN 31* 36* 47* 42*  CREATININE 1.12* 1.07* 1.06* 0.99  CALCIUM  9.3 9.0 9.2 9.8  MG 2.1  --   --   --    GFR: Estimated Creatinine Clearance: 40.4 mL/min (by C-G formula based on SCr of 0.99 mg/dL). Liver Function Tests: Recent Labs  Lab 04/02/24 1050  AST 17  ALT 12  ALKPHOS 74  BILITOT 0.7  PROT 6.9  ALBUMIN  2.8*   No results for input(s): LIPASE, AMYLASE in the last 168 hours. Recent Labs  Lab 04/02/24 1206  AMMONIA 19   Coagulation Profile: No results for input(s): INR, PROTIME in the last 168 hours. Cardiac Enzymes: No results for input(s): CKTOTAL, CKMB, CKMBINDEX, TROPONINI in the last 168 hours. BNP (last 3 results) Recent Labs    02/05/24 1519  PROBNP 754.0*   HbA1C: No results for input(s): HGBA1C in the last 72 hours. CBG: No results for input(s): GLUCAP in the last 168 hours. Lipid Profile: No results for input(s): CHOL, HDL, LDLCALC, TRIG, CHOLHDL, LDLDIRECT in the last 72 hours. Thyroid  Function Tests: No results for input(s): TSH, T4TOTAL, FREET4, T3FREE, THYROIDAB in the last 72 hours.  Anemia Panel: No results for input(s): VITAMINB12, FOLATE, FERRITIN, TIBC, IRON, RETICCTPCT in the last 72 hours. Sepsis Labs: Recent Labs  Lab 04/04/24 0558  PROCALCITON <0.10    Recent Results (from the past 240 hours)  Urine Culture     Status: None   Collection Time: 04/02/24  2:53 PM   Specimen: Urine, Random  Result Value Ref Range Status   Specimen Description   Final     URINE, RANDOM Performed at [facility], [address] [place], [place] [address]    Special Requests   Final    NONE Reflexed from [id number] Performed at [facility], [address] [place], [place] [address]    Culture   Final    NO GROWTH Performed at [facility], [address], [place], [place] [address]    Report Status 04/03/2024 FINAL  Final  MRSA Next Gen by PCR, Nasal     Status: None   Collection Time: 04/02/24  6:10 PM   Specimen: Nasal Mucosa; Nasal Swab  Result Value Ref Range Status   MRSA by PCR Next Gen NOT DETECTED NOT DETECTED Final    Comment: (NOTE) The GeneXpert MRSA Assay (FDA approved for NASAL specimens only), is one component of a comprehensive MRSA colonization  surveillance program. It is not intended to diagnose MRSA infection nor to guide or monitor treatment for MRSA infections. Test performance is not FDA approved in patients less than 33 years old. Performed at [facility], [address] [place], [place] [address]   Culture, blood (Routine X 2) w Reflex to ID Panel     Status: None (Preliminary result)   Collection Time: 04/02/24  6:33 PM   Specimen: BLOOD RIGHT ARM  Result Value Ref Range Status   Specimen Description   Final    BLOOD RIGHT ARM Performed at [facility], [address] [place], [place] [address]    Special Requests   Final    BOTTLES DRAWN AEROBIC ONLY Blood Culture adequate volume Performed at [facility], [address], [place], [place] [address]    Culture   Final    NO GROWTH 3 DAYS Performed at [facility], [address], [place], [place] [address]    Report Status PENDING  Incomplete  Culture, blood (Routine X 2) w Reflex to ID Panel     Status: None (Preliminary result)   Collection Time: 04/02/24  6:34 PM   Specimen: BLOOD RIGHT ARM  Result Value Ref Range Status   Specimen Description   Final    BLOOD  RIGHT ARM Performed at [facility], [address] [place], [place] [address]    Special Requests   Final    BOTTLES DRAWN AEROBIC ONLY Blood Culture adequate volume Performed at [facility], [address] [place], [place] [address]    Culture   Final    NO GROWTH 3 DAYS Performed at [facility], [address] [place], [place] [address]    Report Status PENDING  Incomplete         Radiology Studies: ECHOCARDIOGRAM COMPLETE Result Date: 04/04/2024    ECHOCARDIOGRAM REPORT   Patient Name:   EVALINE WALTMAN Date of Exam: 04/04/2024 Medical Rec #:  [id number]            Height:       64.0 in Accession #:    [id number]           Weight:       141.1 lb Date of Birth:  05/10/1946            BSA:          1.687 m Patient Age:    77 years             BP:           139/63 mmHg Patient Gender: F                    HR:           70 bpm. Exam Location:  Inpatient Procedure: 2D Echo, Cardiac Doppler and Color Doppler (Both Spectral and Color            Flow Doppler were utilized during procedure). Indications:    R06.02 SOB  History:        Patient has prior history of Echocardiogram examinations, most                 recent 08/29/2023. Abnormal ECG and Pacemaker, COPD, Aortic                 Valve Disease and Mitral Valve Disease, Arrythmias:Atrial  Fibrillation, Signs/Symptoms:Chest Pain, Shortness of Breath,                 Dyspnea, Edema and Syncope; Risk Factors:Hypertension and                 Dyslipidemia. Cancer. PFO. MVR. AVR.                 Aortic Valve: 21 mm Magna Ease valve is present in the aortic                 position. Procedure Date: 12/08/2013.                 Mitral Valve: 25 mm bioprosthetic valve valve is present in the                 mitral position. Procedure Date: 12/08/2013.  Sonographer:    Ellouise Mose RDCS Referring Phys: [phone] Ayden Hardwick [facility]  Sonographer Comments: Technically difficult study due to poor echo windows. Patient  could not lie back. IMPRESSIONS  1. Left ventricular ejection fraction, by estimation, is 65 to 70%. The left ventricle has normal function. The left ventricle has no regional wall motion abnormalities. There is moderate concentric left ventricular hypertrophy. Left ventricular diastolic parameters are indeterminate.  2. Right ventricular systolic function is moderately reduced. The right ventricular size is mildly enlarged. There is severely elevated pulmonary artery systolic pressure. The estimated right ventricular systolic pressure is 61.9 mmHg.  3. Right atrial size was mildly dilated.  4. The mitral valve has been repaired/replaced. No evidence of mitral valve regurgitation. Severe mitral stenosis. The mean mitral valve gradient is 13.0 mmHg. There is a 25 mm bioprosthetic valve present in the mitral position. Procedure Date: 12/08/2013. Echo findings are consistent with stenosis the mitral prosthesis.  5. Large pleural effusion.  6. Tricuspid valve regurgitation is moderate.  7. The aortic valve has been repaired/replaced. Aortic valve regurgitation is not visualized. Aortic valve sclerosis is present, with no evidence of aortic valve stenosis. There is a 21 mm Magna Ease valve present in the aortic position. Procedure Date:  12/08/2013.  8. Aortic dilatation noted. There is mild dilatation of the ascending aorta, measuring 42 mm.  9. The inferior vena cava is dilated in size with >50% respiratory variability, suggesting right atrial pressure of 8 mmHg. FINDINGS  Left Ventricle: Left ventricular ejection fraction, by estimation, is 65 to 70%. The left ventricle has normal function. The left ventricle has no regional wall motion abnormalities. The left ventricular internal cavity size was small. There is moderate  concentric left ventricular hypertrophy. Left ventricular diastolic function could not be evaluated due to mitral valve replacement. Left ventricular diastolic parameters are indeterminate. Right  Ventricle: The right ventricular size is mildly enlarged. No increase in right ventricular wall thickness. Right ventricular systolic function is moderately reduced. There is severely elevated pulmonary artery systolic pressure. The tricuspid regurgitant velocity is 3.67 m/s, and with an assumed right atrial pressure of 8 mmHg, the estimated right ventricular systolic pressure is 61.9 mmHg. Left Atrium: Left atrial size was not well visualized. Right Atrium: Right atrial size was mildly dilated. Pericardium: There is no evidence of pericardial effusion. Mitral Valve: The mitral valve has been repaired/replaced. No evidence of mitral valve regurgitation. There is a 25 mm bioprosthetic valve present in the mitral position. Procedure Date: 12/08/2013. Echo findings are consistent with stenosis of the mitral prosthesis. Severe mitral valve stenosis. MV peak gradient, 22.5 mmHg. The mean mitral valve gradient  is 13.0 mmHg. Tricuspid Valve: The tricuspid valve is normal in structure. Tricuspid valve regurgitation is moderate . No evidence of tricuspid stenosis. Aortic Valve: Acceleration time borderline, likely normal valve function. The aortic valve has been repaired/replaced. Aortic valve regurgitation is not visualized. Aortic valve sclerosis is present, with no evidence of aortic valve stenosis. Aortic valve mean gradient measures 13.0 mmHg. Aortic valve peak gradient measures 24.1 mmHg. Aortic valve area, by VTI measures 1.32 cm. There is a 21 mm Magna Ease valve present in the aortic position. Procedure Date: 12/08/2013. Pulmonic Valve: The pulmonic valve was normal in structure. Pulmonic valve regurgitation is mild. No evidence of pulmonic stenosis. Aorta: The aortic root is normal in size and structure and aortic dilatation noted. There is mild dilatation of the ascending aorta, measuring 42 mm. Venous: The inferior vena cava is dilated in size with greater than 50% respiratory variability, suggesting right atrial  pressure of 8 mmHg. IAS/Shunts: The interatrial septum was not well visualized. Additional Comments: There is a large pleural effusion.  LEFT VENTRICLE PLAX 2D LVIDd:         3.70 cm     Diastology LVIDs:         2.60 cm     LV e' medial:    3.26 cm/s LV PW:         2.10 cm     LV E/e' medial:  33.3 LV IVS:        1.20 cm     LV e' lateral:   4.90 cm/s LVOT diam:     2.10 cm     LV E/e' lateral: 22.2 LV SV:         59 LV SV Index:   35 LVOT Area:     3.46 cm  LV Volumes (MOD) LV vol d, MOD A2C: 64.2 ml LV vol d, MOD A4C: 48.8 ml LV vol s, MOD A2C: 12.4 ml LV vol s, MOD A4C: 19.1 ml LV SV MOD A2C:     51.8 ml LV SV MOD A4C:     48.8 ml LV SV MOD BP:      40.8 ml RIGHT VENTRICLE            IVC RV S prime:     5.11 cm/s  IVC diam: 2.60 cm TAPSE (M-mode): 1.2 cm LEFT ATRIUM             Index        RIGHT ATRIUM           Index LA diam:        3.00 cm 1.78 cm/m   RA Area:     18.30 cm LA Vol (A2C):   59.0 ml 34.98 ml/m  RA Volume:   55.30 ml  32.78 ml/m LA Vol (A4C):   37.4 ml 22.17 ml/m LA Biplane Vol: 47.3 ml 28.04 ml/m  AORTIC VALVE                     PULMONIC VALVE AV Area (Vmax):    1.24 cm      PR End Diast Vel: 1.70 msec AV Area (Vmean):   1.20 cm AV Area (VTI):     1.32 cm AV Vmax:           245.67 cm/s AV Vmean:          169.667 cm/s AV VTI:            0.443 m AV Peak Grad:  24.1 mmHg AV Mean Grad:      13.0 mmHg LVOT Vmax:         87.90 cm/s LVOT Vmean:        58.900 cm/s LVOT VTI:          0.169 m LVOT/AV VTI ratio: 0.38  AORTA Ao Root diam: 3.20 cm Ao Asc diam:  4.07 cm MITRAL VALVE                TRICUSPID VALVE MV Area (PHT): 2.24 cm     TR Peak grad:   53.9 mmHg MV Area VTI:   0.80 cm     TR Vmax:        367.00 cm/s MV Peak grad:  22.5 mmHg MV Mean grad:  13.0 mmHg    SHUNTS MV Vmax:       2.37 m/s     Systemic VTI:  0.17 m MV Vmean:      168.0 cm/s   Systemic Diam: 2.10 cm MV Decel Time: 338 msec MV E velocity: 108.56 cm/s MV A velocity: 220.00 cm/s MV E/A ratio:  0.49 [name]  Electronically signed by [name] Signature Date/Time: 04/04/2024/12:26:32 PM    Final    DG ESOPHAGUS W SINGLE CM (SOL OR THIN BA) Result Date: 04/03/2024 CLINICAL DATA:  Dysphagia. Recurrent phlegm in throat, which patient is unable to swallow. EXAM: ESOPHAGUS/BARIUM SWALLOW STUDY TECHNIQUE: Single contrast esophagram was performed using thin liquid barium. Exam was technically limited by patient's frail physical condition, as she was unable to stand erect, lay supine, or roll on fluoroscopy table. Exam was limited to LPO and RPO projections in semi-erect position. FLUOROSCOPY: Radiation Exposure Index (as provided by the fluoroscopic device): 33.1 mGy Kerma COMPARISON:  None Available. FINDINGS: No evidence of esophageal obstruction, constricting mass, or stricture. Elevated left hemidiaphragm noted. No hiatal hernia seen. No gastroesophageal reflux demonstrated on limited exam. IMPRESSION: Technically limited exam. No evidence of esophageal stricture or hiatal hernia. Electronically Signed   By: Norleen DELENA Kil [name]   On: 04/03/2024 15:52        Scheduled Meds:  budesonide -glycopyrrolate -formoterol   2 puff Inhalation BID   buPROPion   150 mg Oral Daily   feeding supplement  237 mL Oral BID BM   furosemide   20 mg Intravenous BID   methylPREDNISolone  (SOLU-MEDROL ) injection  40 mg Intravenous Q12H   metoprolol  succinate  25 mg Oral QHS   montelukast   10 mg Oral QHS   pravastatin   40 mg Oral q1800   traZODone   50 mg Oral QHS   Continuous Infusions:  ceFEPime  (MAXIPIME ) IV 2 g (04/05/24 1003)   heparin      metronidazole  500 mg (04/05/24 1100)     LOS: 3 days    Time spent: 45 minutes spent on chart review, discussion with nursing staff, consultants, updating family and interview/physical exam; more than 50% of that time was spent in counseling and/or coordination of care.    Diane RAYMOND Bowl, DO [facility] Available via Epic secure chat 7am-7pm After these hours, please  refer to coverage provider listed on [email or web address] 04/05/2024, 12:23 PM

## 2024-04-05 NOTE — Progress Notes (Signed)
 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 Anticoag. changed to heparin  drip on 04/05/24  - Oozing wound on R arm noted >pressure wraps  - MD noted, unable to hold blood thinner due to valvular concerns.  - heparin  level collected at 8:29p = 0.85 --> elevated. Likely secondary to residual effect of Eliquis . Will adjust heparin  drip based on aPTT for now until both heparin  level and aPTT correlate, then will monitor with heparin  level alone - aPTT level collected at 8:29p = 39  Goal of Therapy:  Heparin  level: 0.3-0.7   aPTT 66-102 seconds Monitor platelets by anticoagulation protocol: Yes  ++ Will aim for lower end of therapeutic range for both heparin  level and aPTT due to bleeding risk++   Plan: - increase heparin  drip to 850 units/hr - check 8 hr aPTT and heparin  level  - monitor for severity of bleeding   Iantha Batch, PharmD, BCPS 04/05/2024 9:29 PM

## 2024-04-05 NOTE — Consult Note (Addendum)
 Makalyn Claudene Rake Sep 22, 1946  [id number].    Requesting MD: Juvenal, MD Chief Complaint/Reason for Consult: uncontrolled bleeding from skin tears after a fall  HPI:  Cecilee Bloomquist is a 78 y/o F with past medical history of COPD not on oxygen, atrial fibrillation on Eliquis , CHF, open aortic and mitral valve replacements in 2015, rectal cancer status post LAR in 2013 and subsequent ileostomy reversal, and chronic back pain who was admitted to the hospital 04/02/24 for hypoxic respiratory failure.  She was previously admitted 6/9-6/13 for community-acquired pneumonia and fluid overload resulting in acute on chronic respiratory failure.  She was doing okay at home, completed her antibiotics, but then developed dyspnea and generalized weakness/fatigue.  As a result of her weakness she reports falling into a brick fireplace and causing skin tears on her arm.  Denies hitting her head. Just prior to this she dropped a lap desk on her shin, also causing a skin tear and bleeding.  The patient is chronically anticoagulated on Eliquis  for atrial fibrillation.  Eliquis  has been held for 24 hours (last dose 6/25 PM) but due to ongoing bleeding trauma surgery is asked to evaluate.  The patient denies a history of recurrent falls at home, states this is her first time falling.  She lives alone at home with her cat.  Her son and granddaughter are at the bedside today.   On the day of hospital admission CT head was negative for acute traumatic injury, 2 view chest x-ray did not reveal any rib fractures, showed right lower pneumonia and left>R pleural effusion.  The patient denies hip or pelvic pain with mobility.  ROS: Review of Systems  All other systems reviewed and are negative.   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 Known DISH with thoracic spine fusion and moderate kyphosis, h/o L spine fusion L2-5   Adjustment disorder with depressed mood 10/18/14   4 yo son died MI 09-18-2014 Husband died car accident 2015/03/20   Anemia    Anticoagulant long-term use 06/14/2019   She takes warfarin for afib/flutter with CHADS2VASc score of 4 (age, sex, CHF, HTN), s/p 2 bioprosthetic valve replacements Coumadin  changed to eliquis  2022 per cardiology   Aortic atherosclerosis 03/03/2022   Noted on 01/2022 chest CT   Arthritis    Arthropathy of spinal facet joint 03/08/2018   Ascending aortic aneurysm 03/02/2021   4.1cm on CT 01/2021 rec yearly monitoring   Bilateral hip pain 09/08/2016   Cancer (HCC)    conon cancer 2012   CAP (community acquired pneumonia) 02/08/2016   Carotid stenosis 12/08/2020   Carotid US  - 40-59% BICA stenosis (2015) Carotid US  12/2020 - 1-39% BICA stenosis, incidental 3.4cm structure behind L common carotid artery rec further imaging  Cataracts, bilateral    immature   Chest pain 08/25/2014   myoview  low risk 02/2018 ([place])   Chronic cholecystitis with calculus 01/21/2015   S/p cholecystectomy    Chronic diastolic heart failure 09/19/2014   Chronic insomnia    Chronic lower back pain 03/05/2008   Returned to Dr Colon - translaminar ESI at L3/4. Myelogram showed significant left sided foraminal stenosis at L2/3 and L3/4 in setting of solid arthrodesis, planned DEXA and if stable osteopenia, considering anterolateral decompression with spacer.    CKD (chronic kidney disease) stage 2,  GFR 60-89 ml/min 07/03/2015   Constipation    takes Miralax  daily as needed   COPD (chronic obstructive pulmonary disease)    Albuterol  inhaler daily as needed;Duoneb daily as needed;Spiriva  daily   COVID-19 virus infection 11/11/2022   DDD (degenerative disc disease)    cervical - kyphosis with mod DD changes C5/6 and C6/7; lumbar - early DD at L2/3 (Elsner)   Depression    DISH (diffuse idiopathic skeletal hyperostosis) 05/14/2019   By MRI noted fusion of lower thoracic vertebrae - likely contributing to unsteadiness (Elsner)   Dyspnea on exertion 06/05/2015   myoview  low risk 02/2018   Edema of both lower legs due to peripheral venous insufficiency    Essential hypertension, benign 03/24/2009   hx of not on nmeds currenlty and has been a while since on meds per pt   Heart murmur    Hematuria 06/17/2021   History of kidney stones    History of radiation therapy 05/30/11 to 07/07/11   rectum   History of rectal cancer 05/26/2011   Rectal cancer 2012, midrectum [id number], s/p neoadj chemo&XRT, lap LAR with diverting loop ileostomy 08/2011, ileostomy takedown 03/2012 Discharged from oncology clinic 2017 Marquita)   History of shingles    History of vertebral fracture 11/26/2019   Old L1 vertebral compression fracture incidentally noted on imaging 2020    Hyperlipemia    takes Lovastatin  daily   Lacunar infarction    Left caudate   Left hip pain 07/29/2020   Legally blind in left eye    Lymphangioma 01/07/2021   Noted incidentally on carotid US  12/2020 CT neck - Likely benign lymphangioma 01/2021   Macrocytosis 03/27/2022   Folate and b12 checked 2023   Malaise and fatigue 01/22/2018   Meralgia paresthetica 12/13/2007   Mixed stress and urge urinary incontinence 03/16/2015   Failed oxybutynin  IR/ER. Vesicare  initially helped but then no longer helpful.  myrbetriq  was not effective.  Saw urology (MacDiarmid) - UDS largely overactive bladder with mild-mod stress incontinence. Improved on  abx course - maintained on macrodantin  and toviaz .    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   Pulmonary infiltrates 04/23/2019   06/13/2018-CT super D chest without contrast- generally stable chronic lung disease with pleural parenchymal scarring and scattered nodularity most of these nodules have been tree-in-bud in appearance likely postinfectious or inflammatory the dominant right lower lobe nodule seen on most recent study has resolved no new or enlarging nodules  04/22/2019-CT chest without contrast- waxing and waning per   Pulmonary nodule 02/23/2012   RLL nodule-32mm stable 2006, April 2009, and June 2009 Small pulm nodules Jan 2012 - > Oct 2012 without change   Rheumatic heart disease mitral stenosis    mod MS by echo 02/2014   Right leg swelling 06/11/2020   R venous US  06/2020 WNL: - No evidence of deep vein thrombosis in the lower extremity. No indirect evidence of obstruction proximal to the inguinal ligament. - No cystic structure found in the popliteal fossa.   S/P  aortic and mitral valve bioprostheses 12/08/2013   AVR 21 mm, MVR 25 mm-MagnaEase pericardia   S/P AVR (aortic valve replacement) 2015   bioprosthetic (Bartle)   S/P MVR (mitral valve replacement) 2015   bioprosthetic (Bartle)   Sepsis secondary to UTI (HCC) 08/22/2017   Severe aortic valve stenosis 10/01/2013   mild-mod by echo 02/2014   Skin lesion 01/03/2022   Small bowel obstruction, partial 08/2013   reolved without NGT placement.    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   Dr Amy [place]   Syncope 05/28/2018   Thrombocytopenia 11/04/2019   Typical atrial flutter 05/15/2019   Vitamin B12 deficiency 02/28/2015   Start B12 shots 02/2015    Vitamin D  deficiency 10/23/2016   Wounds, multiple    bilateral legs    Past Surgical History:  Procedure Laterality Date   ANTERIOR LAT LUMBAR FUSION Left 07/17/2018   Procedure: Lumbar Two-Three Lumbar Three-Four Anterolateral decompression/interbody fusion with lateral plate fixation/Infuse;  Surgeon: Colon Shove, MD;  Location: [facility];  Service: Neurosurgery;  Laterality: Left;  Lumbar Two-Three Lumbar Three-Four Anterolateral decompression/interbody fusion with lateral plate fixation/Infuse   AORTIC VALVE REPLACEMENT N/A 12/12/2013   Procedure: AORTIC VALVE REPLACEMENT (AVR);  Surgeon: Dorise MARLA Fellers, MD; Service: Open Heart Surgery   BACK SURGERY  2006, 2007   2006 SPACER, 2007 decompression and fusion L4/5   BOWEL RESECTION  04/02/2012   Procedure: SMALL BOWEL RESECTION;  Surgeon: Jina Nephew, MD;  Location: [facility];  Service: General;  Laterality: N/A;   BRONCHIAL WASHINGS  03/24/2021   Procedure: BRONCHIAL WASHINGS;  Surgeon: Geronimo Amel, MD;  Location: [facility];  Service: Endoscopy;;   CARDIOVERSION N/A 08/29/2023   Procedure: CARDIOVERSION (CATH LAB);  Surgeon: Pietro Redell RAMAN, MD;  Location: [facility] INVASIVE CV LAB;  Service: Cardiovascular;  Laterality: N/A;   CARPAL TUNNEL RELEASE Bilateral     CATARACT EXTRACTION W/ INTRAOCULAR LENS IMPLANT Left 10/18/2016   CATARACT EXTRACTION W/ INTRAOCULAR LENS IMPLANT Right 12/13/2016   Dr. Rosan   CHOLECYSTECTOMY N/A 01/21/2015   chronic cholecystitis, Jina Nephew, MD   COLON RESECTION  08/25/2011   Procedure: COLON RESECTION LAPAROSCOPIC;  Surgeon: Jina Nephew, MD;  Location: [facility];  Service: General;  Laterality: N/A;  Laparoscopic Assisted Low Anterior Resection Diverting Ostomy and onQ pain pump   COLONOSCOPY  03/2013   1 polyp, rpt 3 yrs Marianne)   COLONOSCOPY  05/2021   diverticulosis and patent colon anastomosis with some edema/narrowing, rpt 5 yrs Marianne)   COLONOSCOPY WITH  PROPOFOL  N/A 06/09/2016   patent colo-colonic anastomosis, rpt 5 yrs Marianne)   COLONOSCOPY WITH PROPOFOL  N/A 01/25/2024   TA, fair prep (consider repeat if needed), patent rectal anastomosis with congestion edema and stenosis, mild sigmoid diverticulosis Zane Groom, MD)   CYSTOSCOPY/URETEROSCOPY/HOLMIUM LASER/STENT PLACEMENT Right 07/03/2023   Procedure: CYSTOSCOPY RIGHT RETROGRADE PYEPLGRAM RIGHT URETEROSCOPY/HOLMIUM LASER/STENT PLACEMENT;  Surgeon: Carolee Sherwood JONETTA DOUGLAS, MD;  Location: [facility];  Service: Urology;  Laterality: Right;  1 HR FOR CASE   CYSTOSCOPY/URETEROSCOPY/HOLMIUM LASER/STENT PLACEMENT Right 07/17/2023   Procedure: CYSTOSCOPY/RIGHT URETEROSCOPY/STENT EXCHANGE;  Surgeon: Carolee Sherwood JONETTA DOUGLAS, MD;  Location: [facility];  Service: Urology;  Laterality: Right;  60 MINS FOR CASE   EP IMPLANTABLE DEVICE N/A 06/16/2016   Procedure: Pacemaker Implant;  Surgeon: Will Gladis Norton, MD;  [facility]: [facility] INVASIVE CV LAB;  Service: Cardiovascular;  Laterality: N/A;   ESOPHAGOGASTRODUODENOSCOPY (EGD) WITH PROPOFOL  N/A 01/25/2024   diffuse gastritis benign biopsy Zane, Groom, MD)   FOOT SURGERY  left foot   hammer toe   ILEOSTOMY  08/25/2011   ILEOSTOMY CLOSURE  04/02/2012   Procedure: ILEOSTOMY TAKEDOWN;  Surgeon: Jina Nephew, MD;  Location: [facility];   Service: General;  Laterality: N/A;   INTRAOPERATIVE TRANSESOPHAGEAL ECHOCARDIOGRAM N/A 12/12/2013   Procedure: INTRAOPERATIVE TRANSESOPHAGEAL ECHOCARDIOGRAM;  Surgeon: Dorise MARLA Fellers, MD;  Location: [facility];  Service: Open Heart Surgery;  Laterality: N/A;   LEFT AND RIGHT HEART CATHETERIZATION WITH CORONARY ANGIOGRAM N/A 11/27/2013   Procedure: LEFT AND RIGHT HEART CATHETERIZATION WITH CORONARY ANGIOGRAM;  Surgeon: [name], MD;  Location: [facility] CATH LAB;  Service: Cardiovascular;  Laterality: N/A;   MITRAL VALVE REPLACEMENT N/A 12/12/2013   Procedure: MITRAL VALVE (MV) REPLACEMENT OR REPAIR;  Surgeon: Dorise MARLA Fellers, MD; Service: Open Heart Surgery   POLYPECTOMY  01/25/2024   Procedure: POLYPECTOMY, INTESTINE;  Surgeon: Charlanne Groom, MD;  [facility]: [facility];  Service: Gastroenterology;;   TEE WITHOUT CARDIOVERSION N/A 11/29/2013   Procedure: TRANSESOPHAGEAL ECHOCARDIOGRAM (TEE);  Surgeon: Wilbert JONELLE Bihari, MD;  Location: [facility] ENDOSCOPY;  Service: Cardiovascular;  Laterality: N/A;   TEE WITHOUT CARDIOVERSION N/A 06/04/2019   Procedure: TRANSESOPHAGEAL ECHOCARDIOGRAM (TEE);  Surgeon: Balding Jerel, MD;  Location: [facility] ENDOSCOPY;  Service: Cardiovascular;  Laterality: N/A;   TONSILLECTOMY     TRANSESOPHAGEAL ECHOCARDIOGRAM (CATH LAB) N/A 08/29/2023   Procedure: TRANSESOPHAGEAL ECHOCARDIOGRAM;  Surgeon: Pietro Redell RAMAN, MD;  Location: [facility] INVASIVE CV LAB;  Service: Cardiovascular;  Laterality: N/A;   VIDEO BRONCHOSCOPY N/A 03/24/2021   Procedure: VIDEO BRONCHOSCOPY WITHOUT FLUORO;  Surgeon: Geronimo Amel, MD;  Location: [facility];  Service: Endoscopy;  Laterality: N/A;  SMALL SCOPE,PREMEDICATE WITH NEBULIZED LIDOCAINE     Social History:  reports that she quit smoking about 49 years ago. Her smoking use included cigarettes. She started smoking about 59 years ago. She has a 15 pack-year smoking history. She has never used smokeless tobacco. She reports that she does not drink alcohol  and  does not use drugs.  Allergies:  Allergies  Allergen Reactions   Tape Other (See Comments)    THICK TAPES PULLS OFF MY SKIN   Codeine Nausea Only   Prednisone  Nausea Only   Sulfa Antibiotics Nausea Only    Medications Prior to Admission  Medication Sig Dispense Refill   acetaminophen  (TYLENOL ) 500 MG tablet Take 2 tablets (1,000 mg total) by mouth in the morning and at bedtime. (Patient taking differently: Take 1,000 mg by mouth daily.)     albuterol  (VENTOLIN  HFA) 108 (90 Base) MCG/ACT inhaler Inhale 2  puffs into the lungs every 6 (six) hours as needed for wheezing. 18 g 6   amoxicillin -clavulanate (AUGMENTIN ) 875-125 MG tablet Take 1 tablet by mouth 2 (two) times daily for 7 days. 14 tablet 0   ascorbic acid  (VITAMIN C) 500 MG tablet Take 1 tablet (500 mg total) by mouth daily.     Biotin 1000 MCG tablet Take 1,000 mcg by mouth daily.     buPROPion  (WELLBUTRIN  SR) 150 MG 12 hr tablet TAKE 1 TABLET(150 MG) BY MOUTH EVERY MORNING 90 tablet 1   Cephalexin  250 MG tablet Take 250 mg by mouth daily with breakfast.     cholecalciferol (VITAMIN D3) 25 MCG (1000 UNIT) tablet Take 1,000 Units by mouth daily.     ELIQUIS  5 MG TABS tablet TAKE 1 TABLET(5 MG) BY MOUTH TWICE DAILY 180 tablet 1   ferrous sulfate  325 (65 FE) MG EC tablet Take 1 tablet (325 mg total) by mouth daily with breakfast.     Fluticasone -Umeclidin-Vilant (TRELEGY ELLIPTA ) 200-62.5-25 MCG/ACT AEPB Inhale 1 puff into the lungs daily. 60 each 11   furosemide  (LASIX ) 40 MG tablet TAKE 1 TABLET BY MOUTH EVERY DAY. MAY TAKE AN EXTRA TABLET AS NEEDED FOR SWELLING (Patient taking differently: Take 40 mg by mouth in the morning.) 135 tablet 2   gabapentin  (NEURONTIN ) 100 MG capsule Take 1 capsule (100 mg total) by mouth 2 (two) times daily as needed (back pain - for daytime use). (Patient taking differently: Take 100 mg by mouth daily as needed (back pain - for daytime use).) 60 capsule 1   gabapentin  (NEURONTIN ) 300 MG capsule Take  300 mg by mouth at bedtime.     lisinopril  (ZESTRIL ) 5 MG tablet Take 5 mg by mouth daily.     lovastatin  (MEVACOR ) 40 MG tablet TAKE 1 TABLET(40 MG) BY MOUTH AT BEDTIME (Patient taking differently: Take 40 mg by mouth in the morning.) 90 tablet 2   metoprolol  succinate (TOPROL  XL) 25 MG 24 hr tablet Take 1 tablet (25 mg total) by mouth every evening. (Patient taking differently: Take 25 mg by mouth daily.) 90 tablet 3   montelukast  (SINGULAIR ) 10 MG tablet TAKE 1 TABLET(10 MG) BY MOUTH AT BEDTIME (Patient taking differently: Take 10 mg by mouth daily.) 90 tablet 1   Multiple Vitamins-Minerals (PRESERVISION AREDS 2 PO) Take 2 capsules by mouth in the morning.     omeprazole  (PRILOSEC) 20 MG capsule Take 20 mg by mouth daily.     potassium chloride  (KLOR-CON ) 10 MEQ tablet TAKE 2 TABLETS(20 MEQ) BY MOUTH DAILY 180 tablet 1   traZODone  (DESYREL ) 50 MG tablet Take 1 tablet (50 mg total) by mouth at bedtime.     Vibegron  (GEMTESA ) 75 MG TABS Take 75 mg by mouth daily.     vitamin B-12 (CYANOCOBALAMIN ) 500 MCG tablet Take 500 mcg by mouth 2 (two) times a week.     Zinc  50 MG CAPS Take 1 capsule (50 mg total) by mouth daily.  0   Ensifentrine  (OHTUVAYRE ) 3 MG/2.5ML SUSP Inhale 3 mg into the lungs in the morning and at bedtime. Twice daily via neb (Patient not taking: Reported on 04/03/2024)     hydrocortisone  (ANUSOL -HC) 2.5 % rectal cream APPLY RECTALLY TO THE AFFECTED AREA TWICE DAILY (Patient not taking: Reported on 04/03/2024) 30 g 0   ondansetron  (ZOFRAN ) 4 MG tablet Take 1 tablet (4 mg total) by mouth every 8 (eight) hours as needed for nausea or vomiting. (Patient not taking: Reported on 04/03/2024) 20  tablet 0   predniSONE  (DELTASONE ) 20 MG tablet Take by mouth. (Patient not taking: Reported on 04/03/2024)     traMADol  (ULTRAM ) 50 MG tablet Take 1-2 tablets (50-100 mg total) by mouth 2 (two) times daily as needed for moderate pain (pain score 4-6). (Patient not taking: Reported on 04/03/2024) 90 tablet  0     Physical Exam: Blood pressure 136/72, pulse 74, temperature (!) 97.5 F (36.4 C), resp. rate 16, height 5' 4 (1.626 m), weight 64 kg, SpO2 97%. General: Pleasant elderly white female sitting up in chair appears stated age, NAD. HEENT: head -normocephalic, atraumatic; Eyes: PERRLA, no conjunctival injection, anicteric sclerae  Neck- Trachea is midline CV- RRR, normal S1/S2, +murmur, radial and dorsalis pedis pulses 2+ BL, no lower extremity edema Pulm- breathing is non-labored ORA, she has a wet cough Abd- soft, non-tendeer GU- deferred  MSK- UE/LE symmetrical.  Right upper extremity with skin tears on medial/posterior arm as below, slow, sanguinous oozing is present.  There is no point tenderness of the wrist, elbow, or shoulder.  Active range of motion of shoulder elbow and wrist are intact.      RLE as below:    Neuro- non-focal exam Psych- Alert and Oriented x3 with appropriate affect Skin: warm and dry, no rashes or lesions   Results for orders placed or performed during the hospital encounter of 04/02/24 (from the past 48 hours)  CBC     Status: Abnormal   Collection Time: 04/04/24  3:57 AM  Result Value Ref Range   WBC 11.8 (H) 4.0 - 10.5 K/uL   RBC 2.79 (L) 3.87 - 5.11 MIL/uL   Hemoglobin 10.7 (L) 12.0 - 15.0 g/dL   HCT 68.5 (L) 63.9 - 53.9 %   MCV 112.5 (H) 80.0 - 100.0 fL   MCH 38.4 (H) 26.0 - 34.0 pg   MCHC 34.1 30.0 - 36.0 g/dL   RDW 86.7 88.4 - 84.4 %   Platelets 59 (L) 150 - 400 K/uL    Comment: SPECIMEN CHECKED FOR CLOTS Immature Platelet Fraction may be clinically indicated, consider ordering this additional test [id number] CONSISTENT WITH PREVIOUS RESULT    nRBC 0.0 0.0 - 0.2 %    Comment: Performed at [facility], [address] [place], [place] [address]  Basic metabolic panel     Status: Abnormal   Collection Time: 04/04/24  3:57 AM  Result Value Ref Range   Sodium 133 (L) 135 - 145 mmol/L   Potassium 4.2 3.5 - 5.1  mmol/L   Chloride 94 (L) 98 - 111 mmol/L   CO2 28 22 - 32 mmol/L   Glucose, Bld 160 (H) 70 - 99 mg/dL    Comment: Glucose reference range applies only to samples taken after fasting for at least 8 hours.   BUN 47 (H) 8 - 23 mg/dL   Creatinine, Ser 8.93 (H) 0.44 - 1.00 mg/dL   Calcium  9.2 8.9 - 10.3 mg/dL   GFR, Estimated 54 (L) >60 mL/min    Comment: (NOTE) Calculated using the CKD-EPI Creatinine Equation (2021)    Anion gap 11 5 - 15    Comment: Performed at [facility], [address] [place], [place] [address]  Procalcitonin     Status: None   Collection Time: 04/04/24  5:58 AM  Result Value Ref Range   Procalcitonin <0.10 ng/mL    Comment:        Interpretation: PCT (Procalcitonin) <= 0.5 ng/mL: Systemic infection (sepsis) is not  likely. Local bacterial infection is possible. (NOTE)       Sepsis PCT Algorithm           Lower Respiratory Tract                                      Infection PCT Algorithm    ----------------------------     ----------------------------         PCT < 0.25 ng/mL                PCT < 0.10 ng/mL          Strongly encourage             Strongly discourage   discontinuation of antibiotics    initiation of antibiotics    ----------------------------     -----------------------------       PCT 0.25 - 0.50 ng/mL            PCT 0.10 - 0.25 ng/mL               OR       >80% decrease in PCT            Discourage initiation of                                            antibiotics      Encourage discontinuation           of antibiotics    ----------------------------     -----------------------------         PCT >= 0.50 ng/mL              PCT 0.26 - 0.50 ng/mL               AND        <80% decrease in PCT             Encourage initiation of                                             antibiotics       Encourage continuation           of antibiotics    ----------------------------     -----------------------------        PCT >=  0.50 ng/mL                  PCT > 0.50 ng/mL               AND         increase in PCT                  Strongly encourage                                      initiation of antibiotics    Strongly encourage escalation           of antibiotics                                     -----------------------------  PCT <= 0.25 ng/mL                                                 OR                                        > 80% decrease in PCT                                      Discontinue / Do not initiate                                             antibiotics  Performed at [facility], [address] [place], [place] [address]   CBC     Status: Abnormal   Collection Time: 04/05/24  9:35 AM  Result Value Ref Range   WBC 12.5 (H) 4.0 - 10.5 K/uL   RBC 3.09 (L) 3.87 - 5.11 MIL/uL   Hemoglobin 11.0 (L) 12.0 - 15.0 g/dL   HCT 65.7 (L) 63.9 - 53.9 %   MCV 110.7 (H) 80.0 - 100.0 fL   MCH 35.6 (H) 26.0 - 34.0 pg   MCHC 32.2 30.0 - 36.0 g/dL   RDW 86.8 88.4 - 84.4 %   Platelets 63 (L) 150 - 400 K/uL    Comment: SPECIMEN CHECKED FOR CLOTS Immature Platelet Fraction may be clinically indicated, consider ordering this additional test [id number] REPEATED TO VERIFY PLATELET COUNT CONFIRMED BY SMEAR    nRBC 0.0 0.0 - 0.2 %    Comment: Performed at [facility], [address] [place], [place] [address]  Basic metabolic panel     Status: Abnormal   Collection Time: 04/05/24  9:35 AM  Result Value Ref Range   Sodium 136 135 - 145 mmol/L   Potassium 4.3 3.5 - 5.1 mmol/L   Chloride 94 (L) 98 - 111 mmol/L   CO2 34 (H) 22 - 32 mmol/L   Glucose, Bld 111 (H) 70 - 99 mg/dL    Comment: Glucose reference range applies only to samples taken after fasting for at least 8 hours.   BUN 42 (H) 8 - 23 mg/dL   Creatinine, Ser 9.00 0.44 - 1.00 mg/dL   Calcium  9.8 8.9 - 10.3 mg/dL   GFR, Estimated 58 (L) >60 mL/min     Comment: (NOTE) Calculated using the CKD-EPI Creatinine Equation (2021)    Anion gap 8 5 - 15    Comment: Performed at [facility], [address], [place], [place] [address]   *Note: Due to a large number of results and/or encounters for the requested time period, some results have not been displayed. A complete set of results can be found in Results Review.   ECHOCARDIOGRAM COMPLETE Result Date: 04/04/2024    ECHOCARDIOGRAM REPORT   Patient Name:   LORETTO BELINSKY Date of Exam: 04/04/2024 Medical Rec #:  [id number]            Height:       64.0 in Accession #:  [id number]           Weight:       141.1 lb Date of Birth:  [birth year]            BSA:          1.687 m Patient Age:    79 years             BP:           139/63 mmHg Patient Gender: F                    HR:           70 bpm. Exam Location:  Inpatient Procedure: 2D Echo, Cardiac Doppler and Color Doppler (Both Spectral and Color            Flow Doppler were utilized during procedure). Indications:    R06.02 SOB  History:        Patient has prior history of Echocardiogram examinations, most                 recent 08/29/2023. Abnormal ECG and Pacemaker, COPD, Aortic                 Valve Disease and Mitral Valve Disease, Arrythmias:Atrial                 Fibrillation, Signs/Symptoms:Chest Pain, Shortness of Breath,                 Dyspnea, Edema and Syncope; Risk Factors:Hypertension and                 Dyslipidemia. Cancer. PFO. MVR. AVR.                 Aortic Valve: 21 mm Magna Ease valve is present in the aortic                 position. Procedure Date: 12/08/2013.                 Mitral Valve: 25 mm bioprosthetic valve valve is present in the                 mitral position. Procedure Date: 12/08/2013.  Sonographer:    Ellouise Mose RDCS Referring Phys: [phone] JESSICA [facility]  Sonographer Comments: Technically difficult study due to poor echo windows. Patient could not lie back. IMPRESSIONS  1. Left ventricular ejection fraction,  by estimation, is 65 to 70%. The left ventricle has normal function. The left ventricle has no regional wall motion abnormalities. There is moderate concentric left ventricular hypertrophy. Left ventricular diastolic parameters are indeterminate.  2. Right ventricular systolic function is moderately reduced. The right ventricular size is mildly enlarged. There is severely elevated pulmonary artery systolic pressure. The estimated right ventricular systolic pressure is 61.9 mmHg.  3. Right atrial size was mildly dilated.  4. The mitral valve has been repaired/replaced. No evidence of mitral valve regurgitation. Severe mitral stenosis. The mean mitral valve gradient is 13.0 mmHg. There is a 25 mm bioprosthetic valve present in the mitral position. Procedure Date: 12/08/2013. Echo findings are consistent with stenosis the mitral prosthesis.  5. Large pleural effusion.  6. Tricuspid valve regurgitation is moderate.  7. The aortic valve has been repaired/replaced. Aortic valve regurgitation is not visualized. Aortic valve sclerosis is present, with no evidence of aortic valve stenosis. There is a 21 mm Magna Ease valve present in the aortic position. Procedure Date:  12/08/2013.  8. Aortic  dilatation noted. There is mild dilatation of the ascending aorta, measuring 42 mm.  9. The inferior vena cava is dilated in size with >50% respiratory variability, suggesting right atrial pressure of 8 mmHg. FINDINGS  Left Ventricle: Left ventricular ejection fraction, by estimation, is 65 to 70%. The left ventricle has normal function. The left ventricle has no regional wall motion abnormalities. The left ventricular internal cavity size was small. There is moderate  concentric left ventricular hypertrophy. Left ventricular diastolic function could not be evaluated due to mitral valve replacement. Left ventricular diastolic parameters are indeterminate. Right Ventricle: The right ventricular size is mildly enlarged. No increase in right  ventricular wall thickness. Right ventricular systolic function is moderately reduced. There is severely elevated pulmonary artery systolic pressure. The tricuspid regurgitant velocity is 3.67 m/s, and with an assumed right atrial pressure of 8 mmHg, the estimated right ventricular systolic pressure is 61.9 mmHg. Left Atrium: Left atrial size was not well visualized. Right Atrium: Right atrial size was mildly dilated. Pericardium: There is no evidence of pericardial effusion. Mitral Valve: The mitral valve has been repaired/replaced. No evidence of mitral valve regurgitation. There is a 25 mm bioprosthetic valve present in the mitral position. Procedure Date: 12/08/2013. Echo findings are consistent with stenosis of the mitral prosthesis. Severe mitral valve stenosis. MV peak gradient, 22.5 mmHg. The mean mitral valve gradient is 13.0 mmHg. Tricuspid Valve: The tricuspid valve is normal in structure. Tricuspid valve regurgitation is moderate . No evidence of tricuspid stenosis. Aortic Valve: Acceleration time borderline, likely normal valve function. The aortic valve has been repaired/replaced. Aortic valve regurgitation is not visualized. Aortic valve sclerosis is present, with no evidence of aortic valve stenosis. Aortic valve mean gradient measures 13.0 mmHg. Aortic valve peak gradient measures 24.1 mmHg. Aortic valve area, by VTI measures 1.32 cm. There is a 21 mm Magna Ease valve present in the aortic position. Procedure Date: 12/08/2013. Pulmonic Valve: The pulmonic valve was normal in structure. Pulmonic valve regurgitation is mild. No evidence of pulmonic stenosis. Aorta: The aortic root is normal in size and structure and aortic dilatation noted. There is mild dilatation of the ascending aorta, measuring 42 mm. Venous: The inferior vena cava is dilated in size with greater than 50% respiratory variability, suggesting right atrial pressure of 8 mmHg. IAS/Shunts: The interatrial septum was not well visualized.  Additional Comments: There is a large pleural effusion.  LEFT VENTRICLE PLAX 2D LVIDd:         3.70 cm     Diastology LVIDs:         2.60 cm     LV e' medial:    3.26 cm/s LV PW:         2.10 cm     LV E/e' medial:  33.3 LV IVS:        1.20 cm     LV e' lateral:   4.90 cm/s LVOT diam:     2.10 cm     LV E/e' lateral: 22.2 LV SV:         59 LV SV Index:   35 LVOT Area:     3.46 cm  LV Volumes (MOD) LV vol d, MOD A2C: 64.2 ml LV vol d, MOD A4C: 48.8 ml LV vol s, MOD A2C: 12.4 ml LV vol s, MOD A4C: 19.1 ml LV SV MOD A2C:     51.8 ml LV SV MOD A4C:     48.8 ml LV SV MOD BP:  40.8 ml RIGHT VENTRICLE            IVC RV S prime:     5.11 cm/s  IVC diam: 2.60 cm TAPSE (M-mode): 1.2 cm LEFT ATRIUM             Index        RIGHT ATRIUM           Index LA diam:        3.00 cm 1.78 cm/m   RA Area:     18.30 cm LA Vol (A2C):   59.0 ml 34.98 ml/m  RA Volume:   55.30 ml  32.78 ml/m LA Vol (A4C):   37.4 ml 22.17 ml/m LA Biplane Vol: 47.3 ml 28.04 ml/m  AORTIC VALVE                     PULMONIC VALVE AV Area (Vmax):    1.24 cm      PR End Diast Vel: 1.70 msec AV Area (Vmean):   1.20 cm AV Area (VTI):     1.32 cm AV Vmax:           245.67 cm/s AV Vmean:          169.667 cm/s AV VTI:            0.443 m AV Peak Grad:      24.1 mmHg AV Mean Grad:      13.0 mmHg LVOT Vmax:         87.90 cm/s LVOT Vmean:        58.900 cm/s LVOT VTI:          0.169 m LVOT/AV VTI ratio: 0.38  AORTA Ao Root diam: 3.20 cm Ao Asc diam:  4.07 cm MITRAL VALVE                TRICUSPID VALVE MV Area (PHT): 2.24 cm     TR Peak grad:   53.9 mmHg MV Area VTI:   0.80 cm     TR Vmax:        367.00 cm/s MV Peak grad:  22.5 mmHg MV Mean grad:  13.0 mmHg    SHUNTS MV Vmax:       2.37 m/s     Systemic VTI:  0.17 m MV Vmean:      168.0 cm/s   Systemic Diam: 2.10 cm MV Decel Time: 338 msec MV E velocity: 108.56 cm/s MV A velocity: 220.00 cm/s MV E/A ratio:  0.49 Morene Brownie Electronically signed by Morene Brownie Signature Date/Time: 04/04/2024/12:26:32 PM     Final    DG ESOPHAGUS W SINGLE CM (SOL OR THIN BA) Result Date: 04/03/2024 CLINICAL DATA:  Dysphagia. Recurrent phlegm in throat, which patient is unable to swallow. EXAM: ESOPHAGUS/BARIUM SWALLOW STUDY TECHNIQUE: Single contrast esophagram was performed using thin liquid barium. Exam was technically limited by patient's frail physical condition, as she was unable to stand erect, lay supine, or roll on fluoroscopy table. Exam was limited to LPO and RPO projections in semi-erect position. FLUOROSCOPY: Radiation Exposure Index (as provided by the fluoroscopic device): 33.1 mGy Kerma COMPARISON:  None Available. FINDINGS: No evidence of esophageal obstruction, constricting mass, or stricture. Elevated left hemidiaphragm noted. No hiatal hernia seen. No gastroesophageal reflux demonstrated on limited exam. IMPRESSION: Technically limited exam. No evidence of esophageal stricture or hiatal hernia. Electronically Signed   By: Norleen DELENA Kil [name]   On: 04/03/2024 15:52      Assessment/Plan 78 year old female on chronic  anticoagulation for A-fib s/p GLF with persistent sanguinous oozing from right upper extremity and right lower extremity skin tears.  - Afebrile, hemodynamically stable - Hemoglobin is stable at 11 from 10.7, patient is thrombocytopenic platelets are 63 - Recommend continuing to hold anticoagulation, resumption of Eliquis  or heparin  will likely result in ongoing bleeding.  Noted concern for progressive valvular disease and cardiology consult pending. - I placed Surgicel snow as well as Surgicel on the oozing skin tears, Vaseline gauze on top of that, wrapped in Kerlix, wrapped tightly with an Ace wrap.  Recommend leaving dressings in place until Sunday/Monday. - Low suspicion for any missed traumatic injuries, moving her right upper extremity without any issue, no point tenderness over her joints, no major swelling. No new pain of hips or back.   Acute on chronic respiratory  failure COPD CHF PEF Mitral stenosis Atrial fibrillation Hypertension AKI Previous history of rectal cancer  I reviewed nursing notes, hospitalist notes, last 24 h vitals and pain scores, last 48 h intake and output, last 24 h labs and trends, and last 24 h imaging results.  Almarie GORMAN Pringle, PA-C [facility] 04/05/2024, 1:10 PM Please see Amion for pager number during day hours 7:00am-4:30pm or 7:00am -11:30am on weekends

## 2024-04-05 NOTE — Progress Notes (Signed)
 PHARMACY - ANTICOAGULATION CONSULT NOTE  Pharmacy Consult for Heparin  Indication: afib, h/o valve replacement  Allergies  Allergen Reactions   Tape Other (See Comments)    THICK TAPES PULLS OFF MY SKIN   Codeine Nausea Only   Prednisone  Nausea Only   Sulfa Antibiotics Nausea Only    Patient Measurements: Height: 5' 4 (162.6 cm) Weight: 64 kg (141 lb 1.5 oz) IBW/kg (Calculated) : 54.7 HEPARIN  DW (KG): 64  Vital Signs: Temp: 97.5 F (36.4 C) (06/27 0554) BP: 136/72 (06/27 0554) Pulse Rate: 74 (06/27 0554)  Labs: Recent Labs    04/02/24 1833 04/03/24 0418 04/03/24 0418 04/04/24 0357 04/05/24 0935  HGB  --  11.7*   < > 10.7* 11.0*  HCT  --  35.9*  --  31.4* 34.2*  PLT  --  72*  --  59* 63*  CREATININE  --  1.07*  --  1.06* 0.99  TROPONINIHS 78*  --   --   --   --    < > = values in this interval not displayed.    Estimated Creatinine Clearance: 40.4 mL/min (by C-G formula based on SCr of 0.99 mg/dL).   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 Known DISH with thoracic spine fusion and moderate kyphosis, h/o L spine fusion L2-5   Adjustment disorder with depressed mood 10-27-14   41 yo son died MI 09/27/2014 Husband died car accident 2015/03/29   Anemia    Anticoagulant long-term use 06/14/2019   She takes warfarin for afib/flutter with CHADS2VASc score of 4 (age, sex, CHF, HTN), s/p 2 bioprosthetic valve replacements Coumadin  changed to eliquis  2022 per cardiology   Aortic atherosclerosis 03/03/2022   Noted on 01/2022 chest CT   Arthritis    Arthropathy of spinal facet joint 03/08/2018   Ascending aortic aneurysm 03/02/2021   4.1cm on CT 01/2021 rec yearly monitoring   Bilateral hip pain 09/08/2016   Cancer (HCC)     conon cancer 2012   CAP (community acquired pneumonia) 02/08/2016   Carotid stenosis 12/08/2020   Carotid US  - 40-59% BICA stenosis (2015) Carotid US  12/2020 - 1-39% BICA stenosis, incidental 3.4cm structure behind L common carotid artery rec further imaging    Cataracts, bilateral    immature   Chest pain 08/25/2014   myoview  low risk 02/2018 (Camitz)   Chronic cholecystitis with calculus 01/21/2015   S/p cholecystectomy    Chronic diastolic heart failure 09/19/2014   Chronic insomnia    Chronic lower back pain 03/05/2008   Returned to Dr Colon - translaminar ESI at L3/4. Myelogram showed significant left sided foraminal stenosis at L2/3 and L3/4 in setting of solid arthrodesis, planned DEXA and if stable osteopenia, considering anterolateral decompression with spacer.    CKD (chronic kidney disease) stage 2, GFR 60-89 ml/min 07/03/2015   Constipation    takes Miralax  daily as needed   COPD (chronic obstructive pulmonary disease)    Albuterol  inhaler daily as needed;Duoneb daily as needed;Spiriva  daily   COVID-19 virus infection 11/11/2022   DDD (degenerative disc disease)    cervical - kyphosis with mod DD changes C5/6 and C6/7; lumbar - early DD at L2/3 (Elsner)   Depression    DISH (diffuse idiopathic skeletal hyperostosis) 05/14/2019   By MRI noted fusion of lower thoracic  vertebrae - likely contributing to unsteadiness (Elsner)   Dyspnea on exertion 06/05/2015   myoview  low risk 02/2018   Edema of both lower legs due to peripheral venous insufficiency    Essential hypertension, benign 03/24/2009   hx of not on nmeds currenlty and has been a while since on meds per pt   Heart murmur    Hematuria 06/17/2021   History of kidney stones    History of radiation therapy 05/30/11 to 07/07/11   rectum   History of rectal cancer 05/26/2011   Rectal cancer 2012, midrectum [id number], s/p neoadj chemo&XRT, lap LAR with diverting loop ileostomy 08/2011, ileostomy takedown 03/2012 Discharged  from oncology clinic 2017 [place]qu[place])   History of shingles    History of vertebral fracture 11/26/2019   Old L1 vertebral compression fracture incidentally noted on imaging 2020    Hyperlipemia    takes Lovastatin  daily   Lacunar infarction    Left caudate   Left hip pain 07/29/2020   Legally blind in left eye    Lymphangioma 01/07/2021   Noted incidentally on carotid US  12/2020 CT neck - Likely benign lymphangioma 01/2021   Macrocytosis 03/27/2022   Folate and b12 checked 2023   Malaise and fatigue 01/22/2018   Meralgia paresthetica 12/13/2007   Mixed stress and urge urinary incontinence 03/16/2015   Failed oxybutynin  IR/ER. Vesicare  initially helped but then no longer helpful.  myrbetriq  was not effective.  Saw urology (MacDiarmid) - UDS largely overactive bladder with mild-mod stress incontinence. Improved on abx course - maintained on macrodantin  and toviaz .    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   Pulmonary infiltrates 04/23/2019   06/13/2018-CT super D chest  without contrast- generally stable chronic lung disease with pleural parenchymal scarring and scattered nodularity most of these nodules have been tree-in-bud in appearance likely postinfectious or inflammatory the dominant right lower lobe nodule seen on most recent study has resolved no new or enlarging nodules  04/22/2019-CT chest without contrast- waxing and waning per   Pulmonary nodule 02/23/2012   RLL nodule-15mm stable 2006, April 2009, and June 2009 Small pulm nodules Jan 2012 - > Oct 2012 without change   Rheumatic heart disease mitral stenosis    mod MS by echo 02/2014   Right leg swelling 06/11/2020   R venous US  06/2020 WNL: - No evidence of deep vein thrombosis in the lower extremity. No indirect evidence of obstruction proximal to the inguinal ligament. - No cystic structure found in the popliteal fossa.   S/P aortic and mitral valve bioprostheses 12/08/2013   AVR 21 mm, MVR 25 mm-MagnaEase pericardia   S/P AVR (  aortic valve replacement) 2015   bioprosthetic (Bartle)   S/P MVR (mitral valve replacement) 2015   bioprosthetic (Bartle)   Sepsis secondary to UTI (HCC) 08/22/2017   Severe aortic valve stenosis 10/01/2013   mild-mod by echo 02/2014   Skin lesion 01/03/2022   Small bowel obstruction, partial 08/2013   reolved without NGT placement.    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 (age, female, CHF, CVA)  - Hgb 11, Plts only 63 - Chronic thrombocytopenia - Oozing wound on R arm noted>pressure wraps  Goal of Therapy:  aPTT 66-102 seconds Monitor platelets by anticoagulation protocol:  Yes   Plan:  IV heparin  (no bolus) with oozing wound + thrombocytopenia - IV heparin  750 units/hr - aPTT and HL in 8 hrs - Daily apTT and HL until Eliquis  wears off - Daily CBC  Janea Schwenn Karoline Marina, PharmD, BCPS Clinical Staff Pharmacist Marina Salines [facility]illing[facility] 04/05/2024,11:10 AM

## 2024-04-05 NOTE — Progress Notes (Signed)

## 2024-04-06 ENCOUNTER — Inpatient Hospital Stay (HOSPITAL_COMMUNITY)

## 2024-04-06 DIAGNOSIS — I340 Nonrheumatic mitral (valve) insufficiency: Secondary | ICD-10-CM | POA: Diagnosis not present

## 2024-04-06 DIAGNOSIS — J189 Pneumonia, unspecified organism: Secondary | ICD-10-CM | POA: Diagnosis not present

## 2024-04-06 DIAGNOSIS — I5032 Chronic diastolic (congestive) heart failure: Secondary | ICD-10-CM

## 2024-04-06 LAB — BASIC METABOLIC PANEL WITH GFR
Anion gap: 10 (ref 5–15)
BUN: 42 mg/dL — ABNORMAL HIGH (ref 8–23)
CO2: 32 mmol/L (ref 22–32)
Calcium: 9.8 mg/dL (ref 8.9–10.3)
Chloride: 93 mmol/L — ABNORMAL LOW (ref 98–111)
Creatinine, Ser: 0.88 mg/dL (ref 0.44–1.00)
GFR, Estimated: >60 mL/min (ref >60)
Glucose, Bld: 94 mg/dL (ref 70–99)
Potassium: 4.5 mmol/L (ref 3.5–5.1)
Sodium: 135 mmol/L (ref 135–145)

## 2024-04-06 LAB — BODY FLUID CELL COUNT WITH DIFFERENTIAL
Eos, Fluid: 0 %
Lymphs, Fluid: 53 %
Monocyte-Macrophage-Serous Fluid: 43 % — ABNORMAL LOW (ref 50–90)
Neutrophil Count, Fluid: 4 % (ref 0–25)
Total Nucleated Cell Count, Fluid: 1881 uL — ABNORMAL HIGH (ref 0–1000)

## 2024-04-06 LAB — LACTATE DEHYDROGENASE, PLEURAL OR PERITONEAL FLUID: LD, Fluid: 59 U/L — ABNORMAL HIGH (ref 3–23)

## 2024-04-06 LAB — GLUCOSE, PLEURAL OR PERITONEAL FLUID: Glucose, Fluid: 116 mg/dL

## 2024-04-06 LAB — HEPARIN LEVEL (UNFRACTIONATED): Heparin Unfractionated: 0.65 [IU]/mL (ref 0.30–0.70)

## 2024-04-06 LAB — CBC
HCT: 31.9 % — ABNORMAL LOW (ref 36.0–46.0)
Hemoglobin: 10.8 g/dL — ABNORMAL LOW (ref 12.0–15.0)
MCH: 36.4 pg — ABNORMAL HIGH (ref 26.0–34.0)
MCHC: 33.9 g/dL (ref 30.0–36.0)
MCV: 107.4 fL — ABNORMAL HIGH (ref 80.0–100.0)
Platelets: 69 10*3/uL — ABNORMAL LOW (ref 150–400)
RBC: 2.97 MIL/uL — ABNORMAL LOW (ref 3.87–5.11)
RDW: 13.2 % (ref 11.5–15.5)
WBC: 12.1 10*3/uL — ABNORMAL HIGH (ref 4.0–10.5)
nRBC: 0 % (ref 0.0–0.2)

## 2024-04-06 LAB — APTT: aPTT: 33 s (ref 24–36)

## 2024-04-06 LAB — PROTEIN, PLEURAL OR PERITONEAL FLUID: Total protein, fluid: <3 g/dL

## 2024-04-06 MED ORDER — LIDOCAINE HCL 1 % IJ SOLN
INTRAMUSCULAR | Status: AC
Start: 2024-04-06 — End: 2024-04-06
  Filled 2024-04-06: qty 20

## 2024-04-06 MED ORDER — PREDNISONE 20 MG PO TABS
20.0000 mg | ORAL_TABLET | Freq: Every day | ORAL | Status: DC
  Administered 2024-04-07 – 2024-04-10 (×4): 20 mg via ORAL
  Filled 2024-04-06 (×4): qty 1

## 2024-04-06 NOTE — Progress Notes (Signed)
 Subjective/Chief Complaint: Feels weak overall.  Notes her dressings have remained dry throughout the night.   Objective: Vital signs in last 24 hours: Temp:  [97.5 F (36.4 C)-98.2 F (36.8 C)] 97.7 F (36.5 C) (06/28 0534) Pulse Rate:  [70-74] 70 (06/28 0534) Resp:  [18-24] 20 (06/28 0534) BP: (120-128)/(59-77) 120/62 (06/28 0534) SpO2:  [90 %-100 %] 90 % (06/28 0759) FiO2 (%):  [32 %] 32 % (06/28 0759) Weight:  [68 kg] 68 kg (06/28 0500) Last BM Date : 04/06/24  Intake/Output from previous day: 06/27 0701 - 06/28 0700 In: 573.5 [P.O.:100; I.V.:73.5; IV Piggyback:400] Out: 275 [Urine:275] Intake/Output this shift: No intake/output data recorded.  Right upper arm with multiple skin tears and very fragile tissue.  Dressings removed with minimal oozing still ongoing along right forearm.  Additional skin tear at right anterior shin with oozing once dressing was peeled away.  Nonadherent/petroleum gauze dressings replaced.   Lab Results:  Recent Labs    04/04/24 0357 04/05/24 0935  WBC 11.8* 12.5*  HGB 10.7* 11.0*  HCT 31.4* 34.2*  PLT 59* 63*   BMET Recent Labs    04/04/24 0357 04/05/24 0935  NA 133* 136  K 4.2 4.3  CL 94* 94*  CO2 28 34*  GLUCOSE 160* 111*  BUN 47* 42*  CREATININE 1.06* 0.99  CALCIUM  9.2 9.8   PT/INR No results for input(s): LABPROT, INR in the last 72 hours. ABG No results for input(s): PHART, HCO3 in the last 72 hours.  Invalid input(s): PCO2, PO2  Studies/Results: DG CHEST PORT 1 VIEW Result Date: 04/05/2024 CLINICAL DATA:  Pleural effusion EXAM: PORTABLE CHEST 1 VIEW COMPARISON:  04/05/2024 FINDINGS: LEFT-sided pacemaker overlies normal cardiac silhouette. Midline sternotomy. Moderate LEFT pleural effusion is noted. Interval improvement in RIGHT lower lobe airspace disease seen on comparison exam IMPRESSION: 1. Persistent but improved RIGHT lobe airspace disease. 2. Persistent LEFT lung effusion. Electronically Signed    By: Jackquline Boxer [name]   On: 04/05/2024 14:37   ECHOCARDIOGRAM COMPLETE Result Date: 04/04/2024    ECHOCARDIOGRAM REPORT   Patient Name:   [name] Date of Exam: 04/04/2024 Medical Rec #:  [id number]            Height:       64.0 in Accession #:    [id number]           Weight:       141.1 lb Date of Birth:  August 15, 1946            BSA:          1.687 m Patient Age:    78 years             BP:           139/63 mmHg Patient Gender: F                    HR:           70 bpm. Exam Location:  Inpatient Procedure: 2D Echo, Cardiac Doppler and Color Doppler (Both Spectral and Color            Flow Doppler were utilized during procedure). Indications:    R06.02 SOB  History:        Patient has prior history of Echocardiogram examinations, most                 recent 08/29/2023. Abnormal ECG and Pacemaker, COPD, Aortic  Valve Disease and Mitral Valve Disease, Arrythmias:Atrial                 Fibrillation, Signs/Symptoms:Chest Pain, Shortness of Breath,                 Dyspnea, Edema and Syncope; Risk Factors:Hypertension and                 Dyslipidemia. Cancer. PFO. MVR. AVR.                 Aortic Valve: 21 mm Magna Ease valve is present in the aortic                 position. Procedure Date: 12/08/2013.                 Mitral Valve: 25 mm bioprosthetic valve valve is present in the                 mitral position. Procedure Date: 12/08/2013.  Sonographer:    Ellouise Mose RDCS Referring Phys: [phone] JESSICA U [facility]  Sonographer Comments: Technically difficult study due to poor echo windows. Patient could not lie back. IMPRESSIONS  1. Left ventricular ejection fraction, by estimation, is 65 to 70%. The left ventricle has normal function. The left ventricle has no regional wall motion abnormalities. There is moderate concentric left ventricular hypertrophy. Left ventricular diastolic parameters are indeterminate.  2. Right ventricular systolic function is moderately reduced. The right ventricular size is  mildly enlarged. There is severely elevated pulmonary artery systolic pressure. The estimated right ventricular systolic pressure is 61.9 mmHg.  3. Right atrial size was mildly dilated.  4. The mitral valve has been repaired/replaced. No evidence of mitral valve regurgitation. Severe mitral stenosis. The mean mitral valve gradient is 13.0 mmHg. There is a 25 mm bioprosthetic valve present in the mitral position. Procedure Date: 12/08/2013. Echo findings are consistent with stenosis the mitral prosthesis.  5. Large pleural effusion.  6. Tricuspid valve regurgitation is moderate.  7. The aortic valve has been repaired/replaced. Aortic valve regurgitation is not visualized. Aortic valve sclerosis is present, with no evidence of aortic valve stenosis. There is a 21 mm Magna Ease valve present in the aortic position. Procedure Date:  12/08/2013.  8. Aortic dilatation noted. There is mild dilatation of the ascending aorta, measuring 42 mm.  9. The inferior vena cava is dilated in size with >50% respiratory variability, suggesting right atrial pressure of 8 mmHg. FINDINGS  Left Ventricle: Left ventricular ejection fraction, by estimation, is 65 to 70%. The left ventricle has normal function. The left ventricle has no regional wall motion abnormalities. The left ventricular internal cavity size was small. There is moderate  concentric left ventricular hypertrophy. Left ventricular diastolic function could not be evaluated due to mitral valve replacement. Left ventricular diastolic parameters are indeterminate. Right Ventricle: The right ventricular size is mildly enlarged. No increase in right ventricular wall thickness. Right ventricular systolic function is moderately reduced. There is severely elevated pulmonary artery systolic pressure. The tricuspid regurgitant velocity is 3.67 m/s, and with an assumed right atrial pressure of 8 mmHg, the estimated right ventricular systolic pressure is 61.9 mmHg. Left Atrium: Left atrial  size was not well visualized. Right Atrium: Right atrial size was mildly dilated. Pericardium: There is no evidence of pericardial effusion. Mitral Valve: The mitral valve has been repaired/replaced. No evidence of mitral valve regurgitation. There is a 25 mm bioprosthetic valve present in the mitral position. Procedure Date: 12/08/2013. Echo  findings are consistent with stenosis of the mitral prosthesis. Severe mitral valve stenosis. MV peak gradient, 22.5 mmHg. The mean mitral valve gradient is 13.0 mmHg. Tricuspid Valve: The tricuspid valve is normal in structure. Tricuspid valve regurgitation is moderate . No evidence of tricuspid stenosis. Aortic Valve: Acceleration time borderline, likely normal valve function. The aortic valve has been repaired/replaced. Aortic valve regurgitation is not visualized. Aortic valve sclerosis is present, with no evidence of aortic valve stenosis. Aortic valve mean gradient measures 13.0 mmHg. Aortic valve peak gradient measures 24.1 mmHg. Aortic valve area, by VTI measures 1.32 cm. There is a 21 mm Magna Ease valve present in the aortic position. Procedure Date: 12/08/2013. Pulmonic Valve: The pulmonic valve was normal in structure. Pulmonic valve regurgitation is mild. No evidence of pulmonic stenosis. Aorta: The aortic root is normal in size and structure and aortic dilatation noted. There is mild dilatation of the ascending aorta, measuring 42 mm. Venous: The inferior vena cava is dilated in size with greater than 50% respiratory variability, suggesting right atrial pressure of 8 mmHg. IAS/Shunts: The interatrial septum was not well visualized. Additional Comments: There is a large pleural effusion.  LEFT VENTRICLE PLAX 2D LVIDd:         3.70 cm     Diastology LVIDs:         2.60 cm     LV e' medial:    3.26 cm/s LV PW:         2.10 cm     LV E/e' medial:  33.3 LV IVS:        1.20 cm     LV e' lateral:   4.90 cm/s LVOT diam:     2.10 cm     LV E/e' lateral: 22.2 LV SV:         59  LV SV Index:   35 LVOT Area:     3.46 cm  LV Volumes (MOD) LV vol d, MOD A2C: 64.2 ml LV vol d, MOD A4C: 48.8 ml LV vol s, MOD A2C: 12.4 ml LV vol s, MOD A4C: 19.1 ml LV SV MOD A2C:     51.8 ml LV SV MOD A4C:     48.8 ml LV SV MOD BP:      40.8 ml RIGHT VENTRICLE            IVC RV S prime:     5.11 cm/s  IVC diam: 2.60 cm TAPSE (M-mode): 1.2 cm LEFT ATRIUM             Index        RIGHT ATRIUM           Index LA diam:        3.00 cm 1.78 cm/m   RA Area:     18.30 cm LA Vol (A2C):   59.0 ml 34.98 ml/m  RA Volume:   55.30 ml  32.78 ml/m LA Vol (A4C):   37.4 ml 22.17 ml/m LA Biplane Vol: 47.3 ml 28.04 ml/m  AORTIC VALVE                     PULMONIC VALVE AV Area (Vmax):    1.24 cm      PR End Diast Vel: 1.70 msec AV Area (Vmean):   1.20 cm AV Area (VTI):     1.32 cm AV Vmax:           245.67 cm/s AV Vmean:  169.667 cm/s AV VTI:            0.443 m AV Peak Grad:      24.1 mmHg AV Mean Grad:      13.0 mmHg LVOT Vmax:         87.90 cm/s LVOT Vmean:        58.900 cm/s LVOT VTI:          0.169 m LVOT/AV VTI ratio: 0.38  AORTA Ao Root diam: 3.20 cm Ao Asc diam:  4.07 cm MITRAL VALVE                TRICUSPID VALVE MV Area (PHT): 2.24 cm     TR Peak grad:   53.9 mmHg MV Area VTI:   0.80 cm     TR Vmax:        367.00 cm/s MV Peak grad:  22.5 mmHg MV Mean grad:  13.0 mmHg    SHUNTS MV Vmax:       2.37 m/s     Systemic VTI:  0.17 m MV Vmean:      168.0 cm/s   Systemic Diam: 2.10 cm MV Decel Time: 338 msec MV E velocity: 108.56 cm/s MV A velocity: 220.00 cm/s MV E/A ratio:  0.49 [name] Electronically signed by [name] Signature Date/Time: 04/04/2024/12:26:32 PM    Final     Anti-infectives: Anti-infectives (From admission, onward)    Start     Dose/Rate Route Frequency Ordered Stop   04/03/24 1015  metroNIDAZOLE  (FLAGYL ) IVPB 500 mg        500 mg 100 mL/hr over 60 Minutes Intravenous Every 12 hours 04/03/24 0920     04/02/24 1500  ceFEPIme  (MAXIPIME ) 2 g in sodium chloride  0.9 % 100 mL  IVPB        2 g 200 mL/hr over 30 Minutes Intravenous 2 times daily 04/02/24 1457     04/02/24 1400  cefTRIAXone  (ROCEPHIN ) 1 g in sodium chloride  0.9 % 100 mL IVPB  Status:  Discontinued        1 g 200 mL/hr over 30 Minutes Intravenous  Once 04/02/24 1346 04/02/24 1428   04/02/24 1400  azithromycin  (ZITHROMAX ) 500 mg in sodium chloride  0.9 % 250 mL IVPB  Status:  Discontinued        500 mg 250 mL/hr over 60 Minutes Intravenous  Once 04/02/24 1346 04/02/24 1428       Assessment/Plan:  78 year old female on chronic anticoagulation for A-fib s/p GLF with persistent sanguinous oozing from right upper extremity and right lower extremity skin tears.  - Hold anticoagulation as able - Continue gentle local wound care with nonadherent dressings - Surgery team will be available as needed    Acute on chronic respiratory failure COPD CHF PEF Mitral stenosis Atrial fibrillation Hypertension AKI Previous history of rectal cancer   I reviewed nursing notes, hospitalist notes, last 24 h vitals and pain scores, last 48 h intake and output, last 24 h labs and trends, and last 24 h imaging results.     LOS: 4 days    [name] 04/06/2024

## 2024-04-06 NOTE — Progress Notes (Signed)
  Progress Note  Patient Name: [name] Date of Encounter: 04/06/2024 [facility] Cardiologist: [name], MD   Interval Summary   Feels terrible, very weak. Still bleeding slowly from skin wounds. No dyspnea at rest. She sleeps sitting up due to her back problems.  Vital Signs Vitals:   04/05/24 2048 04/05/24 2147 04/06/24 0500 04/06/24 0534  BP:  (!) 128/59  120/62  Pulse:  70  70  Resp:  (!) 24  20  Temp:  (!) 97.5 F (36.4 C)  97.7 F (36.5 C)  TempSrc:    Oral  SpO2: 96% 100%  90%  Weight:   68 kg   Height:        Intake/Output Summary (Last 24 hours) at 04/06/2024 0758 Last data filed at 04/06/2024 0650 Gross per 24 hour  Intake 573.48 ml  Output 275 ml  Net 298.48 ml      04/06/2024    5:00 AM 04/02/2024   10:12 AM 03/29/2024   11:11 AM  Last 3 Weights  Weight (lbs) 149 lb 14.4 oz 141 lb 1.5 oz 142 lb 7 oz  Weight (kg) 67.994 kg 64 kg 64.609 kg      Telemetry/ECG  AV paced - Personally Reviewed  Physical Exam  GEN: No acute distress.   Neck: 7 cm JVD, prominent V waves Cardiac: RRR, 2/6 RUSB Ao ejection murmur, LLSB holosystolic murmur, no murmur at apex, no diastolic murmurs, rubs, or gallops.  Respiratory: diminished breath sounds and dullness to percussion left base, otherwise clear to auscultation bilaterally. GI: Soft, nontender, non-distended  MS: No edema, but extremities wrapped/dressed Still with oozing from left upper arm and left lower leg wounds  Assessment & Plan  Some signs of hypervolemia on exam. Her weight is about 7 lb up from usual home weight, 8 lb up from admission Hold heparin  for 24 hours to see if this allows oozing from skin wounds to stop. Would avoid longer interruption in anticoagulation due to high cardioembolic riak and suspicion of mitral vale deterioration due to thrombosis. Recommend US  guided thoracentesis of the left pleural effusion while we hold the anticoagulants. Continue the diuretic started  yesterday.   For questions or updates, please contact [facility] Please consult [email or web address] for contact info under       Signed, [name], MD

## 2024-04-06 NOTE — Progress Notes (Addendum)
 PHARMACY - ANTICOAGULATION CONSULT NOTE  Pharmacy Consult for Heparin  Indication: afib, h/o valve replacement  Allergies  Allergen Reactions   Tape Other (See Comments)    THICK TAPES PULLS OFF MY SKIN   Codeine Nausea Only   Prednisone  Nausea Only   Sulfa Antibiotics Nausea Only    Patient Measurements: Height: 5' 4 (162.6 cm) Weight: 68 kg (149 lb 14.4 oz) IBW/kg (Calculated) : 54.7 HEPARIN  DW (KG): 64  Vital Signs: Temp: 97.7 F (36.5 C) (06/28 0534) Temp Source: Oral (06/28 0534) BP: 120/62 (06/28 0534) Pulse Rate: 70 (06/28 0534)  Labs: Recent Labs    04/04/24 0357 04/05/24 0935 04/05/24 2029 04/06/24 0556  HGB 10.7* 11.0*  --   --   HCT 31.4* 34.2*  --   --   PLT 59* 63*  --   --   APTT  --   --  39* 33  HEPARINUNFRC  --   --  0.85* 0.65  CREATININE 1.06* 0.99  --   --     Estimated Creatinine Clearance: 44.4 mL/min (by C-G formula based on SCr of 0.99 mg/dL).  Assessment:  AC/Heme: Eliquis  PTA for afib.(LD 6/25 PM) and h/o bioprosthetic valve replacement with CHADS2VASC: >=at least 6. (age, female, CHF, CVA)  - Chronic thrombocytopenia - Oozing wound on R arm noted>pressure  Wraps  04/06/2024 aPTT = 33 seconds - below goal after heparin  drip increased to 850 units/hr Heparin  level = 0.65 - therapeutic but elevated due to apixaban  LD 6/25 PM CBC ordered but not done Oozing wound on R arm >on 6/27 CCS placed Surgicel snow & Surgicel & pressure  Wraps  Goal of Therapy:  aPTT 66-102 seconds Monitor platelets by anticoagulation protocol: Yes   Plan:  No bolus with oozing wound + thrombocytopenia Increase IV heparin  to 1000 units/hr aPTT & heparin  level 8 hrs after rate increase Will add CBC to 8 hr level since AM CBC not done - Daily apTT and HL until Eliquis  wears off - Daily CBC  Rosaline IVAR Edison, Pharm.D Use secure chat for questions 04/06/2024 8:21 AM  Addendum: Heparin  drip stopped by cardiology to allow oozing from skin wounds to  stop. To get US  guided thoracentesis while drip on  hold.  Will f/u for orders to resume/restart heparin   Rosaline IVAR Edison, Pharm.D Use secure chat for questions 04/06/2024 9:07 AM

## 2024-04-06 NOTE — Progress Notes (Signed)
 PROGRESS NOTE    [name]  FMW:[id number] DOB: 01-Apr-1946 DOA: 04/02/2024 PCP: [name], MD  Brief Narrative: 78 y.o. female with medical history significant for atrial fibrillation on Eliquis , anxiety, chronic diastolic congestive heart failure and COPD chronically on room air who was recently discharged from [facility] on 6/13 after a stay for community-acquired pneumonia and combined COPD exacerbation being admitted to the hospital with recurrent acute hypoxic respiratory failure with worsening pneumonia.  History is provided by the patient as well as her granddaughter who is at the bedside, they state that she was discharged home on 6/13 on 2 L nasal cannula oxygen.  She completed a course of oral antibiotics after discharge.  She was doing well at home, had remained on supplemental oxygen.  However over the last 2 to 3 days, patient and family noticed that she developed more of a wet cough, felt more dyspnea with exertion, weakness and unsteadiness on her feet.  She fell at home yesterday and tore up the skin on her right arm.  Initially patient was thought to have a recurrent pneumonia but echo shows concern for issues with worsening valve function so wonder if patient has pleural effusions instead of pneumonia.   Assessment & Plan:   Principal Problem:   Multifocal pneumonia    Acute hypoxic respiratory failure-patient was treated for community-acquired pneumonia and discharged on 2 L of oxygen earlier this month.  She is readmitted with dyspnea on exertion cough and generalized weakness.  Her saturations dropped to 78% on room air 6/28 and she was placed on 2 L of oxygen with improvement.   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  Remains on IV antibiotics till thoracentesis can be done and fluid studies sent out. Cefepime  and flagyl  ?aspiration concerns ST saw her 6/25 no  aspiration risk rec regular diet     oozing wound on right arm-patient seen by general surgery recommended to hold anticoagulation if possible however risky to hold anticoagulation too long due to concern for thrombosis/mitral valve stenosis General Surgery did dressing changes 627 and recommended to leave it in place till Sunday or Monday.   COPD-no wheezing noted today.  She received Solu-Medrol  for 4 days.  Will change to prednisone  6/28 especially being a hard stick and ongoing losing and frail skin.  Continue nebulizers and incentive spirometer and flutter valve.  Heart failure with preserved EF but mitral valve appears to have worsened from prior: Severe mitral stenosis. The mean mitral valve gradient is 13.0 mmHg.  WEIGHT UP by 8 lb since admit -Continue toprol  XL  - IV Lasix  -Cardiology following recommended to hold heparin  for 24 hours to decrease the bleeding hoping we also can do a thoracentesis while heparin  being held.  IR consulted for the same.  6/28.   Atrial fibrillation-continue metoprolol   Eliquis  on hold.   Heparin  drip on hold for 24 hours starting 6/28 (okayed by cardiology due to ongoing oozing from the skin.)  hypertension-lisinopril  was on hold due to AKI and soft blood pressure.  AKI resolved.   Acute kidney injury-presumably due to her recent hospitalizations, feeling poorly and reduced p.o. intake. -Hold lisinopril  - Creatinine stable   Troponin-likely mild leak in the setting of acute infection - Stable    Chronic thrombocytopenia - Trending down - Monitor closely   Estimated body mass index is 25.73 kg/m as calculated from the following:   Height  as of this encounter: 5' 4 (1.626 m).   Weight as of this encounter: 68 kg.  DVT prophylaxis: heparin  Code Status:full Family Communication: dw patient  Disposition Plan:  Status is: Inpatient Remains inpatient appropriate because: acute illness   Consultants:   Cardiology surgery   Subjective: Reports that she is not feeling good she was able to ambulate in the hallway 2 days ago today she barely can move from the bed to the chair  Objective: Vitals:   04/05/24 2147 04/06/24 0500 04/06/24 0534 04/06/24 0759  BP: (!) 128/59  120/62   Pulse: 70  70   Resp: (!) 24  20   Temp: (!) 97.5 F (36.4 C)  97.7 F (36.5 C)   TempSrc:   Oral   SpO2: 100%  90% 90%  Weight:  68 kg    Height:        Intake/Output Summary (Last 24 hours) at 04/06/2024 0850 Last data filed at 04/06/2024 9349 Gross per 24 hour  Intake 573.48 ml  Output 275 ml  Net 298.48 ml   Filed Weights   04/02/24 1012 04/06/24 0500  Weight: 64 kg 68 kg    Examination:  General exam: Appears very weak and frail Respiratory system: coarse bs Cardiovascular system:reg Gastrointestinal system: Abdomen is nondistended, soft and nontender. No organomegaly or masses felt. Normal bowel sounds heard. Central nervous system: Alert and oriented. No focal neurological deficits. Extremities:edema rue covered with dressing media pics noted frail bleeding skin   Data Reviewed: I have personally reviewed following labs and imaging studies  CBC: Recent Labs  Lab 04/02/24 1050 04/03/24 0418 04/04/24 0357 04/05/24 0935  WBC 11.5* 9.8 11.8* 12.5*  NEUTROABS 10.3*  --   --   --   HGB 12.3 11.7* 10.7* 11.0*  HCT 38.2 35.9* 31.4* 34.2*  MCV 111.4* 113.6* 112.5* 110.7*  PLT 81* 72* 59* 63*   Basic Metabolic Panel: Recent Labs  Lab 04/02/24 1050 04/03/24 0418 04/04/24 0357 04/05/24 0935  NA 140 134* 133* 136  K 3.8 4.6 4.2 4.3  CL 93* 92* 94* 94*  CO2 36* 31 28 34*  GLUCOSE 114* 124* 160* 111*  BUN 31* 36* 47* 42*  CREATININE 1.12* 1.07* 1.06* 0.99  CALCIUM  9.3 9.0 9.2 9.8  MG 2.1  --   --   --    GFR: Estimated Creatinine Clearance: 44.4 mL/min (by C-G formula based on SCr of 0.99 mg/dL). Liver Function Tests: Recent Labs  Lab 04/02/24 1050  AST 17  ALT 12   ALKPHOS 74  BILITOT 0.7  PROT 6.9  ALBUMIN  2.8*   No results for input(s): LIPASE, AMYLASE in the last 168 hours. Recent Labs  Lab 04/02/24 1206  AMMONIA 19   Coagulation Profile: No results for input(s): INR, PROTIME in the last 168 hours. Cardiac Enzymes: No results for input(s): CKTOTAL, CKMB, CKMBINDEX, TROPONINI in the last 168 hours. BNP (last 3 results) Recent Labs    02/05/24 1519  PROBNP 754.0*   HbA1C: No results for input(s): HGBA1C in the last 72 hours. CBG: No results for input(s): GLUCAP in the last 168 hours. Lipid Profile: No results for input(s): CHOL, HDL, LDLCALC, TRIG, CHOLHDL, LDLDIRECT in the last 72 hours. Thyroid  Function Tests: No results for input(s): TSH, T4TOTAL, FREET4, T3FREE, THYROIDAB in the last 72 hours. Anemia Panel: No results for input(s): VITAMINB12, FOLATE, FERRITIN, TIBC, IRON, RETICCTPCT in the last 72 hours. Sepsis Labs: Recent Labs  Lab 04/04/24 0558  PROCALCITON <0.10  Recent Results (from the past 240 hours)  Urine Culture     Status: None   Collection Time: 04/02/24  2:53 PM   Specimen: Urine, Random  Result Value Ref Range Status   Specimen Description   Final    URINE, RANDOM Performed at [facility], [address] [place], [place] [address]    Special Requests   Final    NONE Reflexed from [id number] Performed at [facility], [address] [place], [place] [address]    Culture   Final    NO GROWTH Performed at [facility], [address], [place], [place] [address]    Report Status 04/03/2024 FINAL  Final  MRSA Next Gen by PCR, Nasal     Status: None   Collection Time: 04/02/24  6:10 PM   Specimen: Nasal Mucosa; Nasal Swab  Result Value Ref Range Status   MRSA by PCR Next Gen NOT DETECTED NOT DETECTED Final    Comment: (NOTE) The GeneXpert MRSA Assay (FDA approved for NASAL specimens only), is one  component of a comprehensive MRSA colonization surveillance program. It is not intended to diagnose MRSA infection nor to guide or monitor treatment for MRSA infections. Test performance is not FDA approved in patients less than 32 years old. Performed at [facility], [address] [place], [place] [address]   Culture, blood (Routine X 2) w Reflex to ID Panel     Status: None (Preliminary result)   Collection Time: 04/02/24  6:33 PM   Specimen: BLOOD RIGHT ARM  Result Value Ref Range Status   Specimen Description   Final    BLOOD RIGHT ARM Performed at [facility], [address] [place], [place] [address]    Special Requests   Final    BOTTLES DRAWN AEROBIC ONLY Blood Culture adequate volume Performed at [facility], [address] [place], [place] [address]    Culture   Final    NO GROWTH 4 DAYS Performed at [facility], [address], [place], [place] [address]    Report Status PENDING  Incomplete  Culture, blood (Routine X 2) w Reflex to ID Panel     Status: None (Preliminary result)   Collection Time: 04/02/24  6:34 PM   Specimen: BLOOD RIGHT ARM  Result Value Ref Range Status   Specimen Description   Final    BLOOD RIGHT ARM Performed at [facility], [address], [place], [place] [address]    Special Requests   Final    BOTTLES DRAWN AEROBIC ONLY Blood Culture adequate volume Performed at [facility], [address] [place], [place] [address]    Culture   Final    NO GROWTH 4 DAYS Performed at [facility], [address] [place], [place] [address]    Report Status PENDING  Incomplete         Radiology Studies: DG CHEST PORT 1 VIEW Result Date: 04/05/2024 CLINICAL DATA:  Pleural effusion EXAM: PORTABLE CHEST 1 VIEW COMPARISON:  04/05/2024 FINDINGS: LEFT-sided pacemaker overlies normal cardiac silhouette. Midline sternotomy. Moderate LEFT pleural effusion is noted.  Interval improvement in RIGHT lower lobe airspace disease seen on comparison exam IMPRESSION: 1. Persistent but improved RIGHT lobe airspace disease. 2. Persistent LEFT lung effusion. Electronically Signed   By: Jackquline Boxer [name]   On: 04/05/2024 14:37   ECHOCARDIOGRAM COMPLETE Result Date: 04/04/2024    ECHOCARDIOGRAM  REPORT   Patient Name:   [name] Date of Exam: 04/04/2024 Medical Rec #:  [id number]            Height:       64.0 in Accession #:    [id number]           Weight:       141.1 lb Date of Birth:  01-01-1946            BSA:          1.687 m Patient Age:    29 years             BP:           139/63 mmHg Patient Gender: F                    HR:           70 bpm. Exam Location:  Inpatient Procedure: 2D Echo, Cardiac Doppler and Color Doppler (Both Spectral and Color            Flow Doppler were utilized during procedure). Indications:    R06.02 SOB  History:        Patient has prior history of Echocardiogram examinations, most                 recent 08/29/2023. Abnormal ECG and Pacemaker, COPD, Aortic                 Valve Disease and Mitral Valve Disease, Arrythmias:Atrial                 Fibrillation, Signs/Symptoms:Chest Pain, Shortness of Breath,                 Dyspnea, Edema and Syncope; Risk Factors:Hypertension and                 Dyslipidemia. Cancer. PFO. MVR. AVR.                 Aortic Valve: 21 mm Magna Ease valve is present in the aortic                 position. Procedure Date: 12/08/2013.                 Mitral Valve: 25 mm bioprosthetic valve valve is present in the                 mitral position. Procedure Date: 12/08/2013.  Sonographer:    Ellouise Mose RDCS Referring Phys: [id number] JESSICA U [facility]  Sonographer Comments: Technically difficult study due to poor echo windows. Patient could not lie back. IMPRESSIONS  1. Left ventricular ejection fraction, by estimation, is 65 to 70%. The left ventricle has normal function. The left ventricle has no regional wall motion abnormalities.  There is moderate concentric left ventricular hypertrophy. Left ventricular diastolic parameters are indeterminate.  2. Right ventricular systolic function is moderately reduced. The right ventricular size is mildly enlarged. There is severely elevated pulmonary artery systolic pressure. The estimated right ventricular systolic pressure is 61.9 mmHg.  3. Right atrial size was mildly dilated.  4. The mitral valve has been repaired/replaced. No evidence of mitral valve regurgitation. Severe mitral stenosis. The mean mitral valve gradient is 13.0 mmHg. There is a 25 mm bioprosthetic valve present in the mitral position. Procedure Date: 12/08/2013. Echo findings are consistent with stenosis the mitral prosthesis.  5. Large pleural effusion.  6. Tricuspid valve regurgitation is moderate.  7.  The aortic valve has been repaired/replaced. Aortic valve regurgitation is not visualized. Aortic valve sclerosis is present, with no evidence of aortic valve stenosis. There is a 21 mm Magna Ease valve present in the aortic position. Procedure Date:  12/08/2013.  8. Aortic dilatation noted. There is mild dilatation of the ascending aorta, measuring 42 mm.  9. The inferior vena cava is dilated in size with >50% respiratory variability, suggesting right atrial pressure of 8 mmHg. FINDINGS  Left Ventricle: Left ventricular ejection fraction, by estimation, is 65 to 70%. The left ventricle has normal function. The left ventricle has no regional wall motion abnormalities. The left ventricular internal cavity size was small. There is moderate  concentric left ventricular hypertrophy. Left ventricular diastolic function could not be evaluated due to mitral valve replacement. Left ventricular diastolic parameters are indeterminate. Right Ventricle: The right ventricular size is mildly enlarged. No increase in right ventricular wall thickness. Right ventricular systolic function is moderately reduced. There is severely elevated pulmonary artery  systolic pressure. The tricuspid regurgitant velocity is 3.67 m/s, and with an assumed right atrial pressure of 8 mmHg, the estimated right ventricular systolic pressure is 61.9 mmHg. Left Atrium: Left atrial size was not well visualized. Right Atrium: Right atrial size was mildly dilated. Pericardium: There is no evidence of pericardial effusion. Mitral Valve: The mitral valve has been repaired/replaced. No evidence of mitral valve regurgitation. There is a 25 mm bioprosthetic valve present in the mitral position. Procedure Date: 12/08/2013. Echo findings are consistent with stenosis of the mitral prosthesis. Severe mitral valve stenosis. MV peak gradient, 22.5 mmHg. The mean mitral valve gradient is 13.0 mmHg. Tricuspid Valve: The tricuspid valve is normal in structure. Tricuspid valve regurgitation is moderate . No evidence of tricuspid stenosis. Aortic Valve: Acceleration time borderline, likely normal valve function. The aortic valve has been repaired/replaced. Aortic valve regurgitation is not visualized. Aortic valve sclerosis is present, with no evidence of aortic valve stenosis. Aortic valve mean gradient measures 13.0 mmHg. Aortic valve peak gradient measures 24.1 mmHg. Aortic valve area, by VTI measures 1.32 cm. There is a 21 mm Magna Ease valve present in the aortic position. Procedure Date: 12/08/2013. Pulmonic Valve: The pulmonic valve was normal in structure. Pulmonic valve regurgitation is mild. No evidence of pulmonic stenosis. Aorta: The aortic root is normal in size and structure and aortic dilatation noted. There is mild dilatation of the ascending aorta, measuring 42 mm. Venous: The inferior vena cava is dilated in size with greater than 50% respiratory variability, suggesting right atrial pressure of 8 mmHg. IAS/Shunts: The interatrial septum was not well visualized. Additional Comments: There is a large pleural effusion.  LEFT VENTRICLE PLAX 2D LVIDd:         3.70 cm     Diastology LVIDs:          2.60 cm     LV e' medial:    3.26 cm/s LV PW:         2.10 cm     LV E/e' medial:  33.3 LV IVS:        1.20 cm     LV e' lateral:   4.90 cm/s LVOT diam:     2.10 cm     LV E/e' lateral: 22.2 LV SV:         59 LV SV Index:   35 LVOT Area:     3.46 cm  LV Volumes (MOD) LV vol d, MOD A2C: 64.2 ml LV vol d, MOD A4C: 48.8  ml LV vol s, MOD A2C: 12.4 ml LV vol s, MOD A4C: 19.1 ml LV SV MOD A2C:     51.8 ml LV SV MOD A4C:     48.8 ml LV SV MOD BP:      40.8 ml RIGHT VENTRICLE            IVC RV S prime:     5.11 cm/s  IVC diam: 2.60 cm TAPSE (M-mode): 1.2 cm LEFT ATRIUM             Index        RIGHT ATRIUM           Index LA diam:        3.00 cm 1.78 cm/m   RA Area:     18.30 cm LA Vol (A2C):   59.0 ml 34.98 ml/m  RA Volume:   55.30 ml  32.78 ml/m LA Vol (A4C):   37.4 ml 22.17 ml/m LA Biplane Vol: 47.3 ml 28.04 ml/m  AORTIC VALVE                     PULMONIC VALVE AV Area (Vmax):    1.24 cm      PR End Diast Vel: 1.70 msec AV Area (Vmean):   1.20 cm AV Area (VTI):     1.32 cm AV Vmax:           245.67 cm/s AV Vmean:          169.667 cm/s AV VTI:            0.443 m AV Peak Grad:      24.1 mmHg AV Mean Grad:      13.0 mmHg LVOT Vmax:         87.90 cm/s LVOT Vmean:        58.900 cm/s LVOT VTI:          0.169 m LVOT/AV VTI ratio: 0.38  AORTA Ao Root diam: 3.20 cm Ao Asc diam:  4.07 cm MITRAL VALVE                TRICUSPID VALVE MV Area (PHT): 2.24 cm     TR Peak grad:   53.9 mmHg MV Area VTI:   0.80 cm     TR Vmax:        367.00 cm/s MV Peak grad:  22.5 mmHg MV Mean grad:  13.0 mmHg    SHUNTS MV Vmax:       2.37 m/s     Systemic VTI:  0.17 m MV Vmean:      168.0 cm/s   Systemic Diam: 2.10 cm MV Decel Time: 338 msec MV E velocity: 108.56 cm/s MV A velocity: 220.00 cm/s MV E/A ratio:  0.49 [name] Electronically signed by [name] Signature Date/Time: 04/04/2024/12:26:32 PM    Final     Scheduled Meds:  budesonide -glycopyrrolate -formoterol   2 puff Inhalation BID   buPROPion   150 mg Oral Daily    Chlorhexidine  Gluconate Cloth  6 each Topical Daily   feeding supplement  237 mL Oral BID BM   furosemide   20 mg Intravenous BID   methylPREDNISolone  (SOLU-MEDROL ) injection  40 mg Intravenous Q12H   metoprolol  succinate  25 mg Oral QHS   montelukast   10 mg Oral QHS   pravastatin   40 mg Oral q1800   traZODone   50 mg Oral QHS   Continuous Infusions:  ceFEPime  (MAXIPIME ) IV 2 g (04/06/24 0054)   metronidazole  500 mg (04/05/24 2309)  LOS: 4 days    Time spent: 50 min  Diane KANDICE Hoots, MD  04/06/2024, 8:50 AM

## 2024-04-06 NOTE — Progress Notes (Signed)
   04/06/24 0759  Oxygen Therapy/Pulse Ox  O2 Flow Rate (L/min) (S)  3 L/min (Applied 3 L [place] due to Sp02 78% on room air.)

## 2024-04-06 NOTE — Plan of Care (Signed)
   Problem: Education: Goal: Knowledge of General Education information will improve Description Including pain rating scale, medication(s)/side effects and non-pharmacologic comfort measures Outcome: Progressing

## 2024-04-07 DIAGNOSIS — I5032 Chronic diastolic (congestive) heart failure: Secondary | ICD-10-CM | POA: Diagnosis not present

## 2024-04-07 DIAGNOSIS — I340 Nonrheumatic mitral (valve) insufficiency: Secondary | ICD-10-CM | POA: Diagnosis not present

## 2024-04-07 DIAGNOSIS — J189 Pneumonia, unspecified organism: Secondary | ICD-10-CM | POA: Diagnosis not present

## 2024-04-07 LAB — CULTURE, BLOOD (ROUTINE X 2)
Culture: NO GROWTH
Culture: NO GROWTH
Special Requests: ADEQUATE
Special Requests: ADEQUATE

## 2024-04-07 LAB — CBC
HCT: 30 % — ABNORMAL LOW (ref 36.0–46.0)
Hemoglobin: 9.7 g/dL — ABNORMAL LOW (ref 12.0–15.0)
MCH: 34.4 pg — ABNORMAL HIGH (ref 26.0–34.0)
MCHC: 32.3 g/dL (ref 30.0–36.0)
MCV: 106.4 fL — ABNORMAL HIGH (ref 80.0–100.0)
Platelets: 56 10*3/uL — ABNORMAL LOW (ref 150–400)
RBC: 2.82 MIL/uL — ABNORMAL LOW (ref 3.87–5.11)
RDW: 13 % (ref 11.5–15.5)
WBC: 8.5 10*3/uL (ref 4.0–10.5)
nRBC: 0 % (ref 0.0–0.2)

## 2024-04-07 LAB — COMPREHENSIVE METABOLIC PANEL WITH GFR
ALT: 13 U/L (ref 0–44)
AST: 19 U/L (ref 15–41)
Albumin: 2.4 g/dL — ABNORMAL LOW (ref 3.5–5.0)
Alkaline Phosphatase: 55 U/L (ref 38–126)
Anion gap: 9 (ref 5–15)
BUN: 39 mg/dL — ABNORMAL HIGH (ref 8–23)
CO2: 33 mmol/L — ABNORMAL HIGH (ref 22–32)
Calcium: 9.4 mg/dL (ref 8.9–10.3)
Chloride: 93 mmol/L — ABNORMAL LOW (ref 98–111)
Creatinine, Ser: 0.76 mg/dL (ref 0.44–1.00)
GFR, Estimated: >60 mL/min (ref >60)
Glucose, Bld: 91 mg/dL (ref 70–99)
Potassium: 4.1 mmol/L (ref 3.5–5.1)
Sodium: 135 mmol/L (ref 135–145)
Total Bilirubin: 0.5 mg/dL (ref 0.0–1.2)
Total Protein: 6 g/dL — ABNORMAL LOW (ref 6.5–8.1)

## 2024-04-07 MED ORDER — HEPARIN (PORCINE) 25000 UT/250ML-% IV SOLN
1000.0000 [IU]/h | INTRAVENOUS | Status: DC
  Administered 2024-04-07: 1000 [IU]/h via INTRAVENOUS
  Filled 2024-04-07: qty 250

## 2024-04-07 MED ORDER — FUROSEMIDE 40 MG PO TABS
40.0000 mg | ORAL_TABLET | Freq: Every day | ORAL | Status: DC
  Administered 2024-04-07 – 2024-04-10 (×4): 40 mg via ORAL
  Filled 2024-04-07 (×5): qty 1

## 2024-04-07 MED ORDER — TRAMADOL HCL 50 MG PO TABS
50.0000 mg | ORAL_TABLET | Freq: Four times a day (QID) | ORAL | Status: DC | PRN
  Administered 2024-04-07 – 2024-04-10 (×6): 100 mg via ORAL
  Filled 2024-04-07 (×7): qty 2

## 2024-04-07 NOTE — Progress Notes (Signed)
 PHARMACY - ANTICOAGULATION CONSULT NOTE  Pharmacy Consult for Heparin  Indication: afib, h/o valve replacement  Allergies  Allergen Reactions   Tape Other (See Comments)    THICK TAPES PULLS OFF MY SKIN   Codeine Nausea Only   Prednisone  Nausea Only   Sulfa Antibiotics Nausea Only    Patient Measurements: Height: 5' 4 (162.6 cm) Weight: 67.2 kg (148 lb 1.6 oz) IBW/kg (Calculated) : 54.7 HEPARIN  DW (KG): 64  Vital Signs: Temp: 97.5 F (36.4 C) (06/29 0519) Temp Source: Oral (06/29 0519) BP: 120/76 (06/29 0519) Pulse Rate: 70 (06/29 0519)  Labs: Recent Labs    04/05/24 0935 04/05/24 2029 04/06/24 0556 04/06/24 0939 04/07/24 0401  HGB 11.0*  --   --  10.8* 9.7*  HCT 34.2*  --   --  31.9* 30.0*  PLT 63*  --   --  69* 56*  APTT  --  39* 33  --   --   HEPARINUNFRC  --  0.85* 0.65  --   --   CREATININE 0.99  --   --  0.88 0.76    Estimated Creatinine Clearance: 54.6 mL/min (by C-G formula based on SCr of 0.76 mg/dL).  AC/Heme: Eliquis  PTA for afib.and h/o bioprosthetic valve replacement (LD 6/25 PM)   with CHADS2VASC: >=at least 6. (age, female, CHF, CVA)  - Chronic thrombocytopenia - Oozing wound on R arm noted>pressure  Wraps  04/07/2024  6/27:  CCS placed Surgicel snow & Surgicel & pressure wraps on oozing skin tears RUE 6/28: Heparin  stopped 6/28 at 0836 am by cardiology to allow oozing from skin tear wounds to stop 6/28 thoracentesis w/ 400 cc serosanguineous fluid removed 6/29 AM: Pharmacy consulted to resume heparin  with no bolus Hg 9.7- down from 10.7 PLT down to 56 from 69  Goal of Therapy:  aPTT 66-102 seconds Heparin  level 0.3 - 0.7 Monitor platelets by anticoagulation protocol: Yes   Plan:  No bolus per MD orders Start heparin  at 1000 units/hr Check aPTT & heparin  level in 8 hrs - Daily apTT and HL until Eliquis  wears off - Daily CBC -Per cardiology note: if there is no bleeding, transition back to oral anticoagulant tomorrow  Rosaline IVAR Edison, Pharm.D Use secure chat for questions 04/07/2024 10:29 AM

## 2024-04-07 NOTE — Progress Notes (Signed)
  Progress Note  Patient Name: [name] Date of Encounter: 04/07/2024 [facility] Cardiologist: [name], MD   Interval Summary   400 cc of serosanguineous left pleural fluid removed yesterday, low protein, but with somewhat elevated LDH and elevated cell count (lympho-monocytic). In/out incomplete record, but weight is down 2 pounds at 148 pounds (previous hospital discharge weight 143 pounds, last 2 office visits weights were 145-147.5 pounds). Stable hemoglobin.  White blood cell count has normalized.  Platelet count slightly worse at 56K.  Vital Signs Vitals:   04/06/24 2111 04/07/24 0500 04/07/24 0519 04/07/24 0927  BP: 129/65  120/76   Pulse: 76  70   Resp:   20   Temp:   (!) 97.5 F (36.4 C)   TempSrc:   Oral   SpO2:   95% 94%  Weight:  67.2 kg    Height:        Intake/Output Summary (Last 24 hours) at 04/07/2024 0954 Last data filed at 04/07/2024 0532 Gross per 24 hour  Intake 930.67 ml  Output 500 ml  Net 430.67 ml      04/07/2024    5:00 AM 04/06/2024    5:00 AM 04/02/2024   10:12 AM  Last 3 Weights  Weight (lbs) 148 lb 1.6 oz 149 lb 14.4 oz 141 lb 1.5 oz  Weight (kg) 67.178 kg 67.994 kg 64 kg      Telemetry/ECG  AV sequential paced, rare PVCs- Personally Reviewed  Physical Exam  GEN: No acute distress.   Neck: No JVD Cardiac: RRR, 2/6 aortic ejection murmur, no apical systolic murmur, no diastolic murmurs, rubs, or gallops.  Respiratory: Clear to auscultation bilaterally. GI: Soft, nontender, non-distended  MS: No edema.  Extensive bruising, but no ongoing oozing.  Assessment & Plan  Volume status improving.  Her weight is down to 148 pounds (although this is higher than her recent hospital discharge weight of 143 pounds, it is similar to her weight when she last saw me in office 02/29/2024 when she weighed 147.5 pounds; at her March office appointment 145 pounds). Based on fluid characteristics, the pleural effusion is likely  parapneumonic, not due to heart failure. Will stop the intravenous furosemide  and resume her oral furosemide  dose. Restart intravenous heparin  without a bolus.  If there is no bleeding, transition back to oral anticoagulant tomorrow.  Reasonable to discharge to rehab facility tomorrow if bleeding is not an issue.    For questions or updates, please contact [facility] Please consult [email or web address] for contact info under       Signed, [name], MD

## 2024-04-07 NOTE — Progress Notes (Signed)
 PROGRESS NOTE  [name] FMW:[id number] DOB: 1945-11-29 DOA: 04/02/2024 PCP: [name], MD   LOS: 5 days   Brief Narrative / Interim history: 78 y.o. female with medical history significant for atrial fibrillation on Eliquis , anxiety, chronic diastolic congestive heart failure and COPD chronically on room air who was recently discharged from [facility] on 6/13 after a stay for community-acquired pneumonia and combined COPD exacerbation being admitted to the hospital with recurrent acute hypoxic respiratory failure with worsening pneumonia.  History is provided by the patient as well as her granddaughter who is at the bedside, they state that she was discharged home on 6/13 on 2 L nasal cannula oxygen.  She completed a course of oral antibiotics after discharge.  She was doing well at home, had remained on supplemental oxygen.  However over the last 2 to 3 days, patient and family noticed that she developed more of a wet cough, felt more dyspnea with exertion, weakness and unsteadiness on her feet.  She fell at home today prior to admission and tore up the skin on her right arm.  She was initially placed on antibiotics for presumed pneumonia.  Underwent a 2D echo which showed normal LVEF, moderately reduced RV, and repaired/replaced mitral valves showing severe stenosis.  Cardiology was consulted  Subjective / 24h Interval events: She is complaining of feeling extremely weak.  No chest pain, still has some shortness of breath.  Complains of swelling in her arms, mainly in the right 1  Assesement and Plan: Principal problem Acute hypoxic respiratory failure, possible CAP with parapneumonic effusion-was recently treated for community-acquired pneumonia and discharged on 2 L of oxygen earlier this month.  She is readmitted with dyspnea, cough, ongoing hypoxemia - Has been placed on antibiotics, would favor to complete a 5-day course - Continue supportive care, wean off to room air as  tolerated - Status post left-sided thoracentesis yesterday, fluid appears exudative.  Gram stain without organisms.  Cultures pending  Active problems Acute on chronic diastolic CHF, severe mitral valve stenosis -cardiology consulted and following.  She is felt to be slightly fluid overloaded and has been started on diuretics, continue furosemide .  In and outs not accurate as she has some unmeasured urine output - Back on anticoagulation due to concern for thrombosis/mitral valve stenosis  Right arm wound  -with a degree of oozing while being anticoagulated.  Anticoagulation was held since yesterday, wheezing is now resolved.  General surgery consulted and evaluated patient, signed off today.  Discussed with cardiology, resume anticoagulation today, IV heparin  without bolus  COPD -no wheezing, status post Solu-Medrol  for 4 days.  No change to prednisone .  Continue nebulizers, IS  PAF-continue metoprolol , Eliquis  is on hold as she is on heparin   Essential hypertension-lisinopril  was held due to soft blood pressure  Acute kidney injury-creatinine back to baseline 0.7, was as high as 1.12  Troponin elevation-flat, not in a pattern consistent with ACS, likely demand ischemia  Chronic thrombocytopenia-slightly worse than her baseline, closely monitor.    Scheduled Meds:  budesonide -glycopyrrolate -formoterol   2 puff Inhalation BID   buPROPion   150 mg Oral Daily   Chlorhexidine  Gluconate Cloth  6 each Topical Daily   feeding supplement  237 mL Oral BID BM   furosemide   20 mg Intravenous BID   metoprolol  succinate  25 mg Oral QHS   montelukast   10 mg Oral QHS   pravastatin   40 mg Oral q1800   predniSONE   20 mg Oral Q breakfast   traZODone   50  mg Oral QHS   Continuous Infusions:  ceFEPime  (MAXIPIME ) IV 2 g (04/06/24 2122)   metronidazole  500 mg (04/06/24 2226)   PRN Meds:.acetaminophen  **OR** acetaminophen , albuterol , guaiFENesin -dextromethorphan , ondansetron  **OR** ondansetron  (ZOFRAN )  IV, sodium chloride  flush, traMADol , traZODone   Current Outpatient Medications  Medication Instructions   acetaminophen  (TYLENOL ) 1,000 mg, Oral, 2 times daily   albuterol  (VENTOLIN  HFA) 108 (90 Base) MCG/ACT inhaler 2 puffs, Inhalation, Every 6 hours PRN   ascorbic acid  (VITAMIN C) 500 mg, Oral, Daily   Biotin 1,000 mcg, Oral, Daily   buPROPion  (WELLBUTRIN  SR) 150 MG 12 hr tablet TAKE 1 TABLET(150 MG) BY MOUTH EVERY MORNING   Cephalexin  250 mg, Oral, Daily with breakfast   cholecalciferol (VITAMIN D3) 1,000 Units, Oral, Daily   ELIQUIS  5 MG TABS tablet TAKE 1 TABLET(5 MG) BY MOUTH TWICE DAILY   ferrous sulfate  325 mg, Oral, Daily with breakfast   Fluticasone -Umeclidin-Vilant (TRELEGY ELLIPTA ) 200-62.5-25 MCG/ACT AEPB 1 puff, Inhalation, Daily   furosemide  (LASIX ) 40 MG tablet TAKE 1 TABLET BY MOUTH EVERY DAY. MAY TAKE AN EXTRA TABLET AS NEEDED FOR SWELLING   gabapentin  (NEURONTIN ) 300 mg, Oral, Daily at bedtime   gabapentin  (NEURONTIN ) 100 mg, Oral, 2 times daily PRN   Gemtesa  75 mg, Oral, Daily   hydrocortisone  (ANUSOL -HC) 2.5 % rectal cream APPLY RECTALLY TO THE AFFECTED AREA TWICE DAILY   lisinopril  (ZESTRIL ) 5 mg, Oral, Daily   lovastatin  (MEVACOR ) 40 MG tablet TAKE 1 TABLET(40 MG) BY MOUTH AT BEDTIME   metoprolol  succinate (TOPROL  XL) 25 mg, Oral, Every evening   montelukast  (SINGULAIR ) 10 MG tablet TAKE 1 TABLET(10 MG) BY MOUTH AT BEDTIME   Multiple Vitamins-Minerals (PRESERVISION AREDS 2 PO) 2 capsules, Oral, Every morning   Ohtuvayre  3 mg, 2 times daily   omeprazole  (PRILOSEC) 20 mg, Oral, Daily   ondansetron  (ZOFRAN ) 4 mg, Oral, Every 8 hours PRN   potassium chloride  (KLOR-CON ) 10 MEQ tablet TAKE 2 TABLETS(20 MEQ) BY MOUTH DAILY   predniSONE  (DELTASONE ) 20 MG tablet Take by mouth.   traMADol  (ULTRAM ) 50-100 mg, Oral, 2 times daily PRN   traZODone  (DESYREL ) 50 mg, Oral, Daily at bedtime   vitamin B-12 (CYANOCOBALAMIN ) 500 mcg, Oral, 2 times weekly   Zinc  50 mg, Oral, Daily     Diet Orders (From admission, onward)     Start     Ordered   04/02/24 1436  Diet Heart Room service appropriate? Yes; Fluid consistency: Thin  Diet effective now       Question Answer Comment  Room service appropriate? Yes   Fluid consistency: Thin      04/02/24 1435            DVT prophylaxis:    Lab Results  Component Value Date   PLT 56 (L) 04/07/2024      Code Status: Full Code  Family Communication: Son over the phone  Status is: Inpatient Remains inpatient appropriate because: Severity of illness   Level of care: Progressive  Consultants:  General Surgery Cardiology  Objective: Vitals:   04/06/24 2111 04/07/24 0500 04/07/24 0519 04/07/24 0927  BP: 129/65  120/76   Pulse: 76  70   Resp:   20   Temp:   (!) 97.5 F (36.4 C)   TempSrc:   Oral   SpO2:   95% 94%  Weight:  67.2 kg    Height:        Intake/Output Summary (Last 24 hours) at 04/07/2024 1014 Last data filed at 04/07/2024 0955 Gross per  24 hour  Intake 1290.67 ml  Output 700 ml  Net 590.67 ml   Wt Readings from Last 3 Encounters:  04/07/24 67.2 kg  03/29/24 64.6 kg  03/19/24 65 kg    Examination:  Constitutional: NAD Eyes: no scleral icterus ENMT: Mucous membranes are moist.  Neck: normal, supple Respiratory: Diminished at the bases but clear, no wheezing. Cardiovascular: Regular rate and rhythm, no murmurs / rubs / gallops.  Trace edema Abdomen: non distended, no tenderness. Bowel sounds positive.  Musculoskeletal: no clubbing / cyanosis.   Data Reviewed: I have independently reviewed following labs and imaging studies   CBC Recent Labs  Lab 04/02/24 1050 04/03/24 0418 04/04/24 0357 04/05/24 0935 04/06/24 0939 04/07/24 0401  WBC 11.5* 9.8 11.8* 12.5* 12.1* 8.5  HGB 12.3 11.7* 10.7* 11.0* 10.8* 9.7*  HCT 38.2 35.9* 31.4* 34.2* 31.9* 30.0*  PLT 81* 72* 59* 63* 69* 56*  MCV 111.4* 113.6* 112.5* 110.7* 107.4* 106.4*  MCH 35.9* 37.0* 38.4* 35.6* 36.4* 34.4*  MCHC  32.2 32.6 34.1 32.2 33.9 32.3  RDW 13.4 13.2 13.2 13.1 13.2 13.0  LYMPHSABS 0.2*  --   --   --   --   --   MONOABS 0.8  --   --   --   --   --   EOSABS 0.1  --   --   --   --   --   BASOSABS 0.0  --   --   --   --   --     Recent Labs  Lab 04/02/24 1050 04/02/24 1206 04/03/24 0418 04/04/24 0357 04/04/24 0558 04/05/24 0935 04/06/24 0939 04/07/24 0401  NA 140  --  134* 133*  --  136 135 135  K 3.8  --  4.6 4.2  --  4.3 4.5 4.1  CL 93*  --  92* 94*  --  94* 93* 93*  CO2 36*  --  31 28  --  34* 32 33*  GLUCOSE 114*  --  124* 160*  --  111* 94 91  BUN 31*  --  36* 47*  --  42* 42* 39*  CREATININE 1.12*  --  1.07* 1.06*  --  0.99 0.88 0.76  CALCIUM  9.3  --  9.0 9.2  --  9.8 9.8 9.4  AST 17  --   --   --   --   --   --  19  ALT 12  --   --   --   --   --   --  13  ALKPHOS 74  --   --   --   --   --   --  55  BILITOT 0.7  --   --   --   --   --   --  0.5  ALBUMIN  2.8*  --   --   --   --   --   --  2.4*  MG 2.1  --   --   --   --   --   --   --   PROCALCITON  --   --   --   --  <0.10  --   --   --   TSH 1.395  --   --   --   --   --   --   --   AMMONIA  --  19  --   --   --   --   --   --  BNP  --  365.5*  --   --   --   --   --   --     ------------------------------------------------------------------------------------------------------------------ No results for input(s): CHOL, HDL, LDLCALC, TRIG, CHOLHDL, LDLDIRECT in the last 72 hours.  Lab Results  Component Value Date   HGBA1C 5.5 12/06/2013   ------------------------------------------------------------------------------------------------------------------ No results for input(s): TSH, T4TOTAL, T3FREE, THYROIDAB in the last 72 hours.  Invalid input(s): FREET3  Cardiac Enzymes No results for input(s): CKMB, TROPONINI, MYOGLOBIN in the last 168 hours.  Invalid input(s):  CK ------------------------------------------------------------------------------------------------------------------    Component Value Date/Time   BNP 365.5 (H) 04/02/2024 1206    CBG: No results for input(s): GLUCAP in the last 168 hours.  Recent Results (from the past 240 hours)  Urine Culture     Status: None   Collection Time: 04/02/24  2:53 PM   Specimen: Urine, Random  Result Value Ref Range Status   Specimen Description   Final    URINE, RANDOM Performed at [facility], [address] [place], [place] [address]    Special Requests   Final    NONE Reflexed from [phone] Performed at [facility], [address] [place], [place] [address]    Culture   Final    NO GROWTH Performed at [facility], [address] [place], [place] [address]    Report Status 04/03/2024 FINAL  Final  MRSA Next Gen by PCR, Nasal     Status: None   Collection Time: 04/02/24  6:10 PM   Specimen: Nasal Mucosa; Nasal Swab  Result Value Ref Range Status   MRSA by PCR Next Gen NOT DETECTED NOT DETECTED Final    Comment: (NOTE) The GeneXpert MRSA Assay (FDA approved for NASAL specimens only), is one component of a comprehensive MRSA colonization surveillance program. It is not intended to diagnose MRSA infection nor to guide or monitor treatment for MRSA infections. Test performance is not FDA approved in patients less than 25 years old. Performed at [facility], [address], [place], [place] [address]   Culture, blood (Routine X 2) w Reflex to ID Panel     Status: None   Collection Time: 04/02/24  6:33 PM   Specimen: BLOOD RIGHT ARM  Result Value Ref Range Status   Specimen Description   Final    BLOOD RIGHT ARM Performed at [facility], [address], [place], [place] [address]    Special Requests   Final    BOTTLES DRAWN AEROBIC ONLY Blood Culture adequate volume Performed at [facility], [address] [place], [place] [address]    Culture   Final    NO GROWTH 5 DAYS Performed at [facility], [address] [place], [place] [address]    Report Status 04/07/2024 FINAL  Final  Culture, blood (Routine X 2) w Reflex to ID Panel     Status: None   Collection Time: 04/02/24  6:34 PM   Specimen: BLOOD RIGHT ARM  Result Value Ref Range Status   Specimen Description   Final    BLOOD RIGHT ARM Performed at [facility], [address], [place], [place] [address]    Special Requests   Final    BOTTLES DRAWN AEROBIC ONLY Blood Culture adequate volume Performed at [facility], [address], [place], [place] [address]    Culture   Final    NO GROWTH 5  DAYS Performed at [facility], [address] [place], [place] [address]    Report Status 04/07/2024 FINAL  Final  Culture, Fungus without Smear     Status: None (Preliminary result)   Collection Time: 04/06/24 11:21 AM   Specimen: PATH Cytology Pleural fluid  Result Value Ref Range Status   Specimen Description   Final    PERITONEAL Performed at [facility], [address], [place], [place] [address]    Special Requests   Final    NONE Performed at [facility], [address], [place], [place] [address]    Culture   Final    NO GROWTH < 24 HOURS Performed at [facility], [address], [place], [place] [address]    Report Status PENDING  Incomplete  Body fluid culture w Gram Stain     Status: None (Preliminary result)   Collection Time: 04/06/24 11:21 AM   Specimen: PATH Cytology Pleural fluid  Result Value Ref Range Status   Specimen Description   Final    PERITONEAL Performed at [facility], [address], [place], [place] [address]    Special Requests   Final    NONE Performed at [facility], [address], [place], [place] [address]    Gram Stain   Final    RARE  WBC PRESENT, PREDOMINANTLY PMN NO ORGANISMS SEEN    Culture   Final    NO GROWTH < 24 HOURS Performed at [facility], [address] [place], [place] [address]    Report Status PENDING  Incomplete     Radiology Studies: US  THORACENTESIS ASP PLEURAL SPACE W/IMG GUIDE Result Date: 04/06/2024 INDICATION: 142230 Pleural effusion 142230 Shortness of breath. Left-sided pleural effusion. Request for diagnostic and therapeutic thoracentesis. EXAM: ULTRASOUND GUIDED LEFT THORACENTESIS MEDICATIONS: 1% plain lidocaine , 4 mL COMPLICATIONS: None immediate. PROCEDURE: An ultrasound guided thoracentesis was thoroughly discussed with the patient and questions answered. The benefits, risks, alternatives and complications were also discussed. The patient understands and wishes to proceed with the procedure. Written consent was obtained. Ultrasound was performed to localize and mark an adequate pocket of fluid in the left chest. The area was then prepped and draped in the normal sterile fashion. 1% Lidocaine  was used for local anesthesia. Under ultrasound guidance a 6 Fr Safe-T-Centesis catheter was introduced. Thoracentesis was performed. The catheter was removed and a dressing applied. FINDINGS: A total of approximately 400 mL of hazy, serosanguineous fluid was removed. Samples were sent to the laboratory as requested by the clinical team. IMPRESSION: Successful ultrasound guided LEFT thoracentesis yielding 400 mL of pleural fluid. Procedure performed by Franky Rusk PA-C and supervised by Dr. Thom Hall Electronically Signed   By: Thom Hall [name]   On: 04/06/2024 15:00   DG Chest Port 1 View Result Date: 04/06/2024 CLINICAL DATA:  [id number] Status post thoracentesis [id number] EXAM: PORTABLE CHEST - 1 VIEW COMPARISON:  04/05/2024 FINDINGS: The apices are excluded despite repeating the exposure. No visualized pneumothorax. Interval decrease in left pleural effusion. Persistent blunting of the right lateral  costophrenic angle. Mild coarse interstitial and alveolar opacities bilaterally as before, with some patchy consolidation/atelectasis in the lung bases, improved on the left since prior study. Heart size and mediastinal contours are within normal limits. Post AVR. Stable left subclavian transvenous pacemaker. Sternotomy wires. IMPRESSION: 1. No pneumothorax status post left thoracentesis. 2. Persistent right pleural effusion and bibasilar consolidation/atelectasis. Electronically Signed  By: JONETTA Faes [name]   On: 04/06/2024 12:28     Nilda Fendt, MD, PhD [facility]  Between 7 am - 7 pm I am available, please contact me via Amion (for emergencies) or Securechat (non urgent messages)  Between 7 pm - 7 am I am not available, please contact night coverage MD/APP via Amion

## 2024-04-07 NOTE — Progress Notes (Signed)
 Mobility Specialist - Progress Note   04/07/24 1155  Mobility  Activity Transferred from bed to chair  Level of Assistance Minimal assist, patient does 75% or more  Assistive Device Front wheel walker  Distance Ambulated (ft) 3 ft  Range of Motion/Exercises Active  Activity Response Tolerated well  Mobility Referral Yes  Mobility visit 1 Mobility  Mobility Specialist Start Time (ACUTE ONLY) 1140  Mobility Specialist Stop Time (ACUTE ONLY) 1155  Mobility Specialist Time Calculation (min) (ACUTE ONLY) 15 min   Pt was found in bed and agreeable to mobilize. C/o back pain. Transferred to recliner chair with all needs met. Call bell in reach and RN notified.   Erminio Leos,  Mobility Specialist Can be reached via Secure Chat

## 2024-04-08 DIAGNOSIS — N179 Acute kidney failure, unspecified: Secondary | ICD-10-CM

## 2024-04-08 DIAGNOSIS — I5033 Acute on chronic diastolic (congestive) heart failure: Secondary | ICD-10-CM

## 2024-04-08 DIAGNOSIS — I77810 Thoracic aortic ectasia: Secondary | ICD-10-CM

## 2024-04-08 DIAGNOSIS — R7989 Other specified abnormal findings of blood chemistry: Secondary | ICD-10-CM

## 2024-04-08 DIAGNOSIS — Z953 Presence of xenogenic heart valve: Secondary | ICD-10-CM | POA: Diagnosis not present

## 2024-04-08 DIAGNOSIS — I4819 Other persistent atrial fibrillation: Secondary | ICD-10-CM | POA: Diagnosis not present

## 2024-04-08 DIAGNOSIS — J189 Pneumonia, unspecified organism: Secondary | ICD-10-CM | POA: Diagnosis not present

## 2024-04-08 LAB — HEPARIN LEVEL (UNFRACTIONATED): Heparin Unfractionated: 0.57 [IU]/mL (ref 0.30–0.70)

## 2024-04-08 LAB — COMPREHENSIVE METABOLIC PANEL WITH GFR
ALT: 13 U/L (ref 0–44)
AST: 18 U/L (ref 15–41)
Albumin: 2.3 g/dL — ABNORMAL LOW (ref 3.5–5.0)
Alkaline Phosphatase: 53 U/L (ref 38–126)
Anion gap: 6 (ref 5–15)
BUN: 39 mg/dL — ABNORMAL HIGH (ref 8–23)
CO2: 32 mmol/L (ref 22–32)
Calcium: 8.8 mg/dL — ABNORMAL LOW (ref 8.9–10.3)
Chloride: 95 mmol/L — ABNORMAL LOW (ref 98–111)
Creatinine, Ser: 0.88 mg/dL (ref 0.44–1.00)
GFR, Estimated: >60 mL/min
Glucose, Bld: 133 mg/dL — ABNORMAL HIGH (ref 70–99)
Potassium: 3.8 mmol/L (ref 3.5–5.1)
Sodium: 133 mmol/L — ABNORMAL LOW (ref 135–145)
Total Bilirubin: 0.7 mg/dL (ref 0.0–1.2)
Total Protein: 5.7 g/dL — ABNORMAL LOW (ref 6.5–8.1)

## 2024-04-08 LAB — CBC
HCT: 30.1 % — ABNORMAL LOW (ref 36.0–46.0)
Hemoglobin: 9.7 g/dL — ABNORMAL LOW (ref 12.0–15.0)
MCH: 34.3 pg — ABNORMAL HIGH (ref 26.0–34.0)
MCHC: 32.2 g/dL (ref 30.0–36.0)
MCV: 106.4 fL — ABNORMAL HIGH (ref 80.0–100.0)
Platelets: 54 10*3/uL — ABNORMAL LOW (ref 150–400)
RBC: 2.83 MIL/uL — ABNORMAL LOW (ref 3.87–5.11)
RDW: 12.9 % (ref 11.5–15.5)
WBC: 8.6 10*3/uL (ref 4.0–10.5)
nRBC: 0 % (ref 0.0–0.2)

## 2024-04-08 LAB — MAGNESIUM: Magnesium: 2 mg/dL (ref 1.7–2.4)

## 2024-04-08 LAB — APTT: aPTT: 85 s — ABNORMAL HIGH (ref 24–36)

## 2024-04-08 MED ORDER — APIXABAN 5 MG PO TABS
5.0000 mg | ORAL_TABLET | Freq: Two times a day (BID) | ORAL | Status: DC
  Administered 2024-04-08 – 2024-04-10 (×5): 5 mg via ORAL
  Filled 2024-04-08 (×5): qty 1

## 2024-04-08 MED ORDER — ORAL CARE MOUTH RINSE: 15.0000 mL | OROMUCOSAL | Status: DC | PRN

## 2024-04-08 NOTE — Progress Notes (Signed)
 PHARMACY - ANTICOAGULATION CONSULT NOTE  Pharmacy Consult for Eliquis  Indication: afib, h/o valve replacement  Allergies  Allergen Reactions   Tape Other (See Comments)    THICK TAPES PULLS OFF MY SKIN   Codeine Nausea Only   Prednisone  Nausea Only   Sulfa Antibiotics Nausea Only    Patient Measurements: Height: 5' 4 (162.6 cm) Weight: 67 kg (147 lb 12.8 oz) IBW/kg (Calculated) : 54.7 HEPARIN  DW (KG): 64  Vital Signs: Temp: 97.6 F (36.4 C) (06/30 0640) Temp Source: Oral (06/30 0640) BP: 149/77 (06/30 0640) Pulse Rate: 72 (06/30 0640)  Labs: Recent Labs    04/05/24 2029 04/06/24 0556 04/06/24 0939 04/07/24 0401 04/08/24 0037  HGB  --   --  10.8* 9.7* 9.7*  HCT  --   --  31.9* 30.0* 30.1*  PLT  --   --  69* 56* 54*  APTT 39* 33  --   --  85*  HEPARINUNFRC 0.85* 0.65  --   --  0.57  CREATININE  --   --  0.88 0.76 0.88    Estimated Creatinine Clearance: 49.6 mL/min (by C-G formula based on SCr of 0.88 mg/dL).  Patient on Eliquis  PTA for afib.and h/o bioprosthetic valve replacement (LD 6/25 PM)   with CHADS2VASC: >=at least 6. (age, female, CHF, CVA) and chronic thrombocytopenia.  Significant Events: 6/27:  CCS placed Surgicel snow & Surgicel & pressure wraps on oozing skin tears RUE 6/28: Heparin  stopped 6/28 at 0836 am by cardiology to allow oozing from skin tear wounds to stop 6/28 thoracentesis w/ 400 cc serosanguineous fluid removed 6/29 AM: Pharmacy consulted to resume heparin  with no bolus  Ok to resume Eliquis  6/30. CBC low but stable.    Plan:  -Stop heparin  with first dose of Eliquis  -Resume Eliquis  at 5 mg BID   Stefano MARLA Bologna, PharmD, BCPS Clinical Pharmacist 04/08/2024 9:26 AM

## 2024-04-08 NOTE — TOC Progression Note (Signed)
 Transition of Care [facility]) - Progression Note    Patient Details  Name: [name] MRN: [id number] Date of Birth: 1946/04/30  [facility] of [facility] CM/SW Contact  Tawni CHRISTELLA Eva, LCSW Phone Number: 04/08/2024, 9:58 AM  Clinical Narrative:     Pt has accepted bed offer from [facility]. Pt will need insurance auth. TOC to follow.   Expected Discharge Plan: Skilled Nursing Facility Barriers to Discharge: Continued Medical Work up  Expected Discharge Plan and Services       Living arrangements for the past 2 months: Single Family Home                                       Social Determinants of Health (SDOH) Interventions SDOH Screenings   Food Insecurity: No Food Insecurity (04/02/2024)  Housing: Low Risk  (04/02/2024)  Transportation Needs: No Transportation Needs (04/02/2024)  Utilities: Not At Risk (04/02/2024)  Alcohol  Screen: Low Risk  (11/03/2023)  Depression (PHQ2-9): Medium Risk (03/14/2024)  Financial Resource Strain: Low Risk  (11/03/2023)  Physical Activity: Unknown (11/03/2023)  Social Connections: Moderately Integrated (04/02/2024)  Stress: No Stress Concern Present (11/03/2023)  Tobacco Use: Medium Risk (04/04/2024)    Readmission Risk Interventions    04/03/2024    9:08 AM 03/22/2024   10:40 AM  Readmission Risk Prevention Plan  Transportation Screening Complete Complete  PCP or Specialist Appt within 5-7 Days  Complete  PCP or Specialist Appt within 3-5 Days Complete   Home Care Screening  Complete  Medication Review (RN CM)  Complete  HRI or Home Care Consult Complete   Social Work Consult for Recovery Care Planning/Counseling Complete   Palliative Care Screening Not Applicable   Medication Review Oceanographer) Complete

## 2024-04-08 NOTE — Progress Notes (Signed)
 PROGRESS NOTE  [name] FMW:[id number] DOB: 02/13/1946 DOA: 04/02/2024 PCP: [name], MD   LOS: 6 days   Brief Narrative / Interim history: 78 y.o. female with medical history significant for atrial fibrillation on Eliquis , anxiety, chronic diastolic congestive heart failure and COPD chronically on room air who was recently discharged from [facility] on 6/13 after a stay for community-acquired pneumonia and combined COPD exacerbation being admitted to the hospital with recurrent acute hypoxic respiratory failure with worsening pneumonia.  History is provided by the patient as well as her granddaughter who is at the bedside, they state that she was discharged home on 6/13 on 2 L nasal cannula oxygen.  She completed a course of oral antibiotics after discharge.  She was doing well at home, had remained on supplemental oxygen.  However over the last 2 to 3 days, patient and family noticed that she developed more of a wet cough, felt more dyspnea with exertion, weakness and unsteadiness on her feet.  She fell at home today prior to admission and tore up the skin on her right arm.  She was initially placed on antibiotics for presumed pneumonia.  Underwent a 2D echo which showed normal LVEF, moderately reduced RV, and repaired/replaced mitral valves showing severe stenosis.  Cardiology was consulted  Subjective / 24h Interval events: She is complaining of feeling extremely weak.  No chest pain, still has some shortness of breath.  Complains of swelling in her arms, mainly in the right 1  Assesement and Plan: Principal problem Acute hypoxic respiratory failure, possible CAP with parapneumonic effusion-was recently treated for community-acquired pneumonia and discharged on 2 L of oxygen earlier this month.  She is readmitted with dyspnea, cough, ongoing hypoxemia - Has been placed on antibiotics, would favor to complete a 5-day course - Continue supportive care, wean off to room air as  tolerated - Status post left-sided thoracentesis yesterday, fluid appears exudative.  Gram stain without organisms- culture negative   Acute on chronic diastolic CHF, severe mitral valve stenosis -cardiology consulted and following.  She is felt to be slightly fluid overloaded and has been started on diuretics, continue furosemide .  In and outs not accurate as she has some unmeasured urine output - Back on anticoagulation due to concern for thrombosis/mitral valve stenosis- change back to oral  Right arm wound  -with a degree of oozing while being anticoagulated.  General surgery consulted and evaluated patient, signed off today -resume oral DOAC  COPD -no wheezing, status post Solu-Medrol  for 4 days.  No change to prednisone .  Continue nebulizers, IS  PAF-continue metoprolol , Eliquis  resumed  Essential hypertension-lisinopril  was held due to soft blood pressure  Acute kidney injury-creatinine back to baseline 0.7, was as high as 1.12  Troponin elevation-flat, not in a pattern consistent with ACS, likely demand ischemia  Chronic thrombocytopenia-slightly worse than her baseline, closely monitor.    Scheduled Meds:  apixaban   5 mg Oral BID   budesonide -glycopyrrolate -formoterol   2 puff Inhalation BID   buPROPion   150 mg Oral Daily   Chlorhexidine  Gluconate Cloth  6 each Topical Daily   feeding supplement  237 mL Oral BID BM   furosemide   40 mg Oral Daily   metoprolol  succinate  25 mg Oral QHS   montelukast   10 mg Oral QHS   pravastatin   40 mg Oral q1800   predniSONE   20 mg Oral Q breakfast   traZODone   50 mg Oral QHS   Continuous Infusions:  ceFEPime  (MAXIPIME ) IV 2 g (04/08/24 0937)  metronidazole  500 mg (04/08/24 1034)   PRN Meds:.acetaminophen  **OR** acetaminophen , albuterol , guaiFENesin -dextromethorphan , ondansetron  **OR** ondansetron  (ZOFRAN ) IV, mouth rinse, sodium chloride  flush, traMADol , traZODone   Current Outpatient Medications  Medication Instructions    acetaminophen  (TYLENOL ) 1,000 mg, Oral, 2 times daily   albuterol  (VENTOLIN  HFA) 108 (90 Base) MCG/ACT inhaler 2 puffs, Inhalation, Every 6 hours PRN   ascorbic acid  (VITAMIN C) 500 mg, Oral, Daily   Biotin 1,000 mcg, Oral, Daily   buPROPion  (WELLBUTRIN  SR) 150 MG 12 hr tablet TAKE 1 TABLET(150 MG) BY MOUTH EVERY MORNING   Cephalexin  250 mg, Oral, Daily with breakfast   cholecalciferol (VITAMIN D3) 1,000 Units, Oral, Daily   ELIQUIS  5 MG TABS tablet TAKE 1 TABLET(5 MG) BY MOUTH TWICE DAILY   ferrous sulfate  325 mg, Oral, Daily with breakfast   Fluticasone -Umeclidin-Vilant (TRELEGY ELLIPTA ) 200-62.5-25 MCG/ACT AEPB 1 puff, Inhalation, Daily   furosemide  (LASIX ) 40 MG tablet TAKE 1 TABLET BY MOUTH EVERY DAY. MAY TAKE AN EXTRA TABLET AS NEEDED FOR SWELLING   gabapentin  (NEURONTIN ) 300 mg, Oral, Daily at bedtime   gabapentin  (NEURONTIN ) 100 mg, Oral, 2 times daily PRN   Gemtesa  75 mg, Oral, Daily   hydrocortisone  (ANUSOL -HC) 2.5 % rectal cream APPLY RECTALLY TO THE AFFECTED AREA TWICE DAILY   lisinopril  (ZESTRIL ) 5 mg, Oral, Daily   lovastatin  (MEVACOR ) 40 MG tablet TAKE 1 TABLET(40 MG) BY MOUTH AT BEDTIME   metoprolol  succinate (TOPROL  XL) 25 mg, Oral, Every evening   montelukast  (SINGULAIR ) 10 MG tablet TAKE 1 TABLET(10 MG) BY MOUTH AT BEDTIME   Multiple Vitamins-Minerals (PRESERVISION AREDS 2 PO) 2 capsules, Oral, Every morning   Ohtuvayre  3 mg, 2 times daily   omeprazole  (PRILOSEC) 20 mg, Oral, Daily   ondansetron  (ZOFRAN ) 4 mg, Oral, Every 8 hours PRN   potassium chloride  (KLOR-CON ) 10 MEQ tablet TAKE 2 TABLETS(20 MEQ) BY MOUTH DAILY   predniSONE  (DELTASONE ) 20 MG tablet Take by mouth.   traMADol  (ULTRAM ) 50-100 mg, Oral, 2 times daily PRN   traZODone  (DESYREL ) 50 mg, Oral, Daily at bedtime   vitamin B-12 (CYANOCOBALAMIN ) 500 mcg, Oral, 2 times weekly   Zinc  50 mg, Oral, Daily    Diet Orders (From admission, onward)     Start     Ordered   04/02/24 1436  Diet Heart Room service  appropriate? Yes; Fluid consistency: Thin  Diet effective now       Question Answer Comment  Room service appropriate? Yes   Fluid consistency: Thin      04/02/24 1435            DVT prophylaxis:  apixaban  (ELIQUIS ) tablet 5 mg   Lab Results  Component Value Date   PLT 54 (L) 04/08/2024      Code Status: Full Code  Family Communication: Son over the phone  Status is: Inpatient Remains inpatient appropriate because: Severity of illness   Level of care: Progressive  Consultants:  General Surgery Cardiology  Objective: Vitals:   04/08/24 0640 04/08/24 0802 04/08/24 1143 04/08/24 1145  BP: (!) 149/77   113/63  Pulse: 72   77  Resp: 20   (!) 22  Temp: 97.6 F (36.4 C)   97.7 F (36.5 C)  TempSrc: Oral   Oral  SpO2: 100% 96% (!) 89% 92%  Weight:      Height:        Intake/Output Summary (Last 24 hours) at 04/08/2024 1149 Last data filed at 04/08/2024 1108 Gross per 24 hour  Intake 971.07 ml  Output 1250 ml  Net -278.93 ml   Wt Readings from Last 3 Encounters:  04/08/24 67 kg  03/29/24 64.6 kg  03/19/24 65 kg    Examination:   General: Appearance:     Overweight female in no acute distress     Lungs:      respirations unlabored  Heart:    Normal heart rate.   MS:   All extremities are intact.   Neurologic:   Awake, alert     Data Reviewed: I have independently reviewed following labs and imaging studies   CBC Recent Labs  Lab 04/02/24 1050 04/03/24 0418 04/04/24 0357 04/05/24 0935 04/06/24 0939 04/07/24 0401 04/08/24 0037  WBC 11.5*   < > 11.8* 12.5* 12.1* 8.5 8.6  HGB 12.3   < > 10.7* 11.0* 10.8* 9.7* 9.7*  HCT 38.2   < > 31.4* 34.2* 31.9* 30.0* 30.1*  PLT 81*   < > 59* 63* 69* 56* 54*  MCV 111.4*   < > 112.5* 110.7* 107.4* 106.4* 106.4*  MCH 35.9*   < > 38.4* 35.6* 36.4* 34.4* 34.3*  MCHC 32.2   < > 34.1 32.2 33.9 32.3 32.2  RDW 13.4   < > 13.2 13.1 13.2 13.0 12.9  LYMPHSABS 0.2*  --   --   --   --   --   --   MONOABS 0.8  --    --   --   --   --   --   EOSABS 0.1  --   --   --   --   --   --   BASOSABS 0.0  --   --   --   --   --   --    < > = values in this interval not displayed.    Recent Labs  Lab 04/02/24 1050 04/02/24 1206 04/03/24 0418 04/04/24 0357 04/04/24 0558 04/05/24 0935 04/06/24 0939 04/07/24 0401 04/08/24 0037  NA 140  --    < > 133*  --  136 135 135 133*  K 3.8  --    < > 4.2  --  4.3 4.5 4.1 3.8  CL 93*  --    < > 94*  --  94* 93* 93* 95*  CO2 36*  --    < > 28  --  34* 32 33* 32  GLUCOSE 114*  --    < > 160*  --  111* 94 91 133*  BUN 31*  --    < > 47*  --  42* 42* 39* 39*  CREATININE 1.12*  --    < > 1.06*  --  0.99 0.88 0.76 0.88  CALCIUM  9.3  --    < > 9.2  --  9.8 9.8 9.4 8.8*  AST 17  --   --   --   --   --   --  19 18  ALT 12  --   --   --   --   --   --  13 13  ALKPHOS 74  --   --   --   --   --   --  55 53  BILITOT 0.7  --   --   --   --   --   --  0.5 0.7  ALBUMIN  2.8*  --   --   --   --   --   --  2.4* 2.3*  MG 2.1  --   --   --   --   --   --   --  2.0  PROCALCITON  --   --   --   --  <0.10  --   --   --   --   TSH 1.395  --   --   --   --   --   --   --   --   AMMONIA  --  19  --   --   --   --   --   --   --   BNP  --  365.5*  --   --   --   --   --   --   --    < > = values in this interval not displayed.    ------------------------------------------------------------------------------------------------------------------ No results for input(s): CHOL, HDL, LDLCALC, TRIG, CHOLHDL, LDLDIRECT in the last 72 hours.  Lab Results  Component Value Date   HGBA1C 5.5 12/06/2013   ------------------------------------------------------------------------------------------------------------------ No results for input(s): TSH, T4TOTAL, T3FREE, THYROIDAB in the last 72 hours.  Invalid input(s): FREET3  Cardiac Enzymes No results for input(s): CKMB, TROPONINI, MYOGLOBIN in the last 168 hours.  Invalid input(s):  CK ------------------------------------------------------------------------------------------------------------------    Component Value Date/Time   BNP 365.5 (H) 04/02/2024 1206    CBG: No results for input(s): GLUCAP in the last 168 hours.  Recent Results (from the past 240 hours)  Urine Culture     Status: None   Collection Time: 04/02/24  2:53 PM   Specimen: Urine, Random  Result Value Ref Range Status   Specimen Description   Final    URINE, RANDOM Performed at [facility], [address], [place], [place] [address]    Special Requests   Final    NONE Reflexed from [id number] Performed at [facility], [address] [place], [place] [address]    Culture   Final    NO GROWTH Performed at [facility], [address], [place], [place] [address]    Report Status 04/03/2024 FINAL  Final  MRSA Next Gen by PCR, Nasal     Status: None   Collection Time: 04/02/24  6:10 PM   Specimen: Nasal Mucosa; Nasal Swab  Result Value Ref Range Status   MRSA by PCR Next Gen NOT DETECTED NOT DETECTED Final    Comment: (NOTE) The GeneXpert MRSA Assay (FDA approved for NASAL specimens only), is one component of a comprehensive MRSA colonization surveillance program. It is not intended to diagnose MRSA infection nor to guide or monitor treatment for MRSA infections. Test performance is not FDA approved in patients less than 70 years old. Performed at [facility], [address] [place], [place] [address]   Culture, blood (Routine X 2) w Reflex to ID Panel     Status: None   Collection Time: 04/02/24  6:33 PM   Specimen: BLOOD RIGHT ARM  Result Value Ref Range Status   Specimen Description   Final    BLOOD RIGHT ARM Performed at [facility], [address] [place], [place] [address]    Special Requests   Final    BOTTLES DRAWN AEROBIC ONLY Blood Culture adequate volume Performed at [facility], [address], [place], [place] [address]    Culture   Final    NO GROWTH 5 DAYS Performed at [facility], [address] [place], [place] [address]    Report Status 04/07/2024 FINAL  Final  Culture, blood (Routine X 2) w Reflex to ID Panel  Status: None   Collection Time: 04/02/24  6:34 PM   Specimen: BLOOD RIGHT ARM  Result Value Ref Range Status   Specimen Description   Final    BLOOD RIGHT ARM Performed at [facility], [address], [place], [place] [address]    Special Requests   Final    BOTTLES DRAWN AEROBIC ONLY Blood Culture adequate volume Performed at [facility], [address] [place], [place] [address]    Culture   Final    NO GROWTH 5 DAYS Performed at [facility], [address] [place], [place] [address]    Report Status 04/07/2024 FINAL  Final  Culture, Fungus without Smear     Status: None (Preliminary result)   Collection Time: 04/06/24 11:21 AM   Specimen: PATH Cytology Pleural fluid  Result Value Ref Range Status   Specimen Description   Final    PERITONEAL Performed at [facility], [address] [place], [place] [address]    Special Requests   Final    NONE Performed at [facility], [address] [place], [place] [address]    Culture   Final    NO GROWTH < 24 HOURS Performed at [facility], [address], [place], [place] [address]    Report Status PENDING  Incomplete  Body fluid culture w Gram Stain     Status: None (Preliminary result)   Collection Time: 04/06/24 11:21 AM   Specimen: PATH Cytology Pleural fluid  Result Value Ref Range Status   Specimen Description   Final    PERITONEAL Performed at [facility], [address] [place], [place] [address]    Special Requests   Final    NONE Performed at [facility], [address], [place], [place] [address]    Gram Stain   Final    RARE  WBC PRESENT, PREDOMINANTLY PMN NO ORGANISMS SEEN    Culture   Final    NO GROWTH 2 DAYS Performed at [facility], [address], [place], [place] [address]    Report Status PENDING  Incomplete     Radiology Studies: No results found.    Harlene Bowl DO [facility]  Between 7 am - 7 pm I am available, please contact me via Amion (for emergencies) or Securechat (non urgent messages)  Between 7 pm - 7 am I am not available, please contact night coverage MD/APP via Amion

## 2024-04-08 NOTE — Progress Notes (Signed)
  Progress Note  Patient Name: [name] Date of Encounter: 04/08/2024 [facility] Cardiologist: [name], MD   Interval Summary   Patient seen and examined at bedside. Son present. Denies anginal chest pain or heart failure symptoms. Still endorses feeling tired and fatigued.  Vital Signs Vitals:   04/08/24 0640 04/08/24 0802 04/08/24 1143 04/08/24 1145  BP: (!) 149/77   113/63  Pulse: 72   77  Resp: 20   (!) 22  Temp: 97.6 F (36.4 C)   97.7 F (36.5 C)  TempSrc: Oral   Oral  SpO2: 100% 96% (!) 89% 92%  Weight:      Height:        Intake/Output Summary (Last 24 hours) at 04/08/2024 1328 Last data filed at 04/08/2024 1108 Gross per 24 hour  Intake 971.07 ml  Output 1250 ml  Net -278.93 ml      04/08/2024    5:00 AM 04/07/2024    5:00 AM 04/06/2024    5:00 AM  Last 3 Weights  Weight (lbs) 147 lb 12.8 oz 148 lb 1.6 oz 149 lb 14.4 oz  Weight (kg) 67.042 kg 67.178 kg 67.994 kg      Telemetry/ECG  AV paced- Personally Reviewed  Physical Exam  GEN: No acute distress.   Neck: No JVD Cardiac: RRR, 3 out of 6 systolic ejection murmur, no  rubs, or gallops.  Respiratory: Decreased breath sounds bilaterally.  No wheezes rales or rhonchi's. GI: Soft, nontender, non-distended  MS: No edema Skin: Diffuse ecchymoses.  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   Stage C, NYHA class II/III Continue Lasix  40 mg p.o. daily. Continue Toprol -XL 25 mg p.o. daily. Prior office visits weights have been around 145-148 pounds POA: Lisinopril  5 mg p.o. daily, Lasix  40 mg p.o. daily Home prior echocardiograms noted mild mitral stenosis, most recent echocardiogram report severe mitral stenosis with a mean gradient of 13 mmHg.  Consider repeating a limited echocardiogram as outpatient when she is euvolemic.  Acute hypoxic respiratory  failure, likely secondary to CAP / parapneumonic effusions Status post left-sided thoracentesis, 400 cc  Persistent atrial fibrillation: Currently paced. History of cardioversion November 2024 Rate control metoprolol . Rhythm control: Thromboembolic prophylaxis: Eliquis . Was on Coumadin  in the past but due to excessive bruising and difficulty maintaining therapeutic INR levels she was transitioned to Eliquis  back in February 2022.  Will  Acute kidney injury: Resolved  Elevated troponins: Likely demand ischemia not consistent w/ ACS  Ascending aortic dilatation: Known history of. Primary cardiologist recommends no follow-up as she would not be a ideal candidate for redo sternotomy. Focus on blood pressure management and avoid antibiotic such as ciprofloxacin .   For questions or updates, please contact [facility] Please consult [email or web address] for contact info under       Signed, Madonna Large, DO, [facility]  A Division of [facility] [address], [place], [place] [address]  [place], [place] [address]

## 2024-04-08 NOTE — Progress Notes (Signed)
 PHARMACY - ANTICOAGULATION CONSULT NOTE  Pharmacy Consult for Heparin  Indication: afib, h/o valve replacement  Allergies  Allergen Reactions   Tape Other (See Comments)    THICK TAPES PULLS OFF MY SKIN   Codeine Nausea Only   Prednisone  Nausea Only   Sulfa Antibiotics Nausea Only    Patient Measurements: Height: 5' 4 (162.6 cm) Weight: 67.2 kg (148 lb 1.6 oz) IBW/kg (Calculated) : 54.7 HEPARIN  DW (KG): 64  Vital Signs: Temp: 98 F (36.7 C) (06/29 2106) Temp Source: Oral (06/29 2106) BP: 118/66 (06/29 2106) Pulse Rate: 74 (06/29 2106)  Labs: Recent Labs    04/05/24 0935 04/05/24 2029 04/06/24 0556 04/06/24 0939 04/07/24 0401 04/08/24 0037  HGB 11.0*  --   --  10.8* 9.7*  --   HCT 34.2*  --   --  31.9* 30.0*  --   PLT 63*  --   --  69* 56*  --   APTT  --  39* 33  --   --  85*  HEPARINUNFRC  --  0.85* 0.65  --   --  0.57  CREATININE 0.99  --   --  0.88 0.76 0.88    Estimated Creatinine Clearance: 49.7 mL/min (by C-G formula based on SCr of 0.88 mg/dL).  Patient on Eliquis  PTA for afib.and h/o bioprosthetic valve replacement (LD 6/25 PM)   with CHADS2VASC: >=at least 6. (age, female, CHF, CVA) and chronic thrombocytopenia.  Significant Events: 6/27:  CCS placed Surgicel snow & Surgicel & pressure wraps on oozing skin tears RUE 6/28: Heparin  stopped 6/28 at 0836 am by cardiology to allow oozing from skin tear wounds to stop 6/28 thoracentesis w/ 400 cc serosanguineous fluid removed 6/29 AM: Pharmacy consulted to resume heparin  with no bolus  04/08/2024 Initial levels after resuming heparin  1000 units/hr are therapeutic aPTT 85 sec Heparin  level 0.57 Levels correlate so will monitor only using heparin  levels going forward Heparin  infusing thru midline; phlebotomist drew labs CBC: Hg 9.7- low/stable; pltc 54-low/stable No bleeding or infusion related concerns reported by RN   Goal of Therapy:  aPTT 66-102 seconds Heparin  level 0.3 - 0.7 Monitor platelets  by anticoagulation protocol: Yes   Plan:  No bolus per MD orders Continue heparin  at 1000 units/hr Daily CBC & heparin  level while on heparin  -Per cardiology note: if there is no bleeding, transition back to oral anticoagulant tomorrow  Rosaline Millet, PharmD, BCPS 04/08/2024 1:25 AM

## 2024-04-08 NOTE — Progress Notes (Signed)
 Physical Therapy Treatment Patient Details Name: [name] MRN: [id number] DOB: 03/21/1946 Today's Date: 04/08/2024   History of Present Illness 78 y.o. female  who was recently discharged from [facility] on 6/13 after a stay for community-acquired pneumonia and combined COPD exacerbation being admitted to the hospital with recurrent acute hypoxic respiratory failure with worsening pneumonia. Pt with medical history significant for atrial fibrillation on Eliquis , anxiety, chronic diastolic congestive heart failure and COPD chronically.    PT Comments   AxO x 3 pleasant Lady who lives home alone and was just recently D/C from hospital. Son in room. Pt was OOB in recliner on RA 89%,  Assisted to [facility].  General transfer comment: min A to power up and to steady, mildly unsteady with VC's on safety with turn completion and hand placement.  Assisted on/off BSC.  Assisted with peri care. General Gait Details: limited distance of 28 feet with walker at [employer] Assist present with poor kyphotic posture and short shuffled steps.  RA avg 92 with HR 97.  Max c/o fatigue. Pt will need ST Rehab at SNF to address mobility and functional decline prior to safely returning home.     If plan is discharge home, recommend the following: A little help with walking and/or transfers;A little help with bathing/dressing/bathroom;Assistance with cooking/housework;Assist for transportation;Help with stairs or ramp for entrance   Can travel by private vehicle     Yes  Equipment Recommendations  None recommended by PT    Recommendations for Other Services       Precautions / Restrictions Precautions Precautions: Fall Precaution/Restrictions Comments: monitor O2, on 2L at home; Hx falls Restrictions Weight Bearing Restrictions Per Provider Order: No     Mobility  Bed Mobility               General bed mobility comments: OOB in recliner    Transfers Overall transfer level: Needs  assistance Equipment used: Rolling walker (2 wheels) Transfers: Sit to/from Stand Sit to Stand: Min assist           General transfer comment: min A to power up and to steady, mildly unsteady with VC's on safety with turn completion and hand placement.  Assisted on/off BSC.  Assisted with peri care.    Ambulation/Gait Ambulation/Gait assistance: Contact guard assist Gait Distance (Feet): 28 Feet Assistive device: Rolling walker (2 wheels) Gait Pattern/deviations: Trunk flexed, Step-through pattern, Decreased stride length, Knee flexed in stance - right, Knee flexed in stance - left, Wide base of support Gait velocity: decreased     General Gait Details: limited distance of 28 feet with walker at Contact Guard Assist present with poor kyphotic posture and short shuffled steps.  RA avg 92 with HR 97.  Max c/o fatigue.   Stairs             Wheelchair Mobility     Tilt Bed    Modified Rankin (Stroke Patients Only)       Balance                                            Communication Communication Communication: Impaired Factors Affecting Communication: Hearing impaired  Cognition Arousal: Alert Behavior During Therapy: WFL for tasks assessed/performed   PT - Cognitive impairments: No apparent impairments  PT - Cognition Comments: AxO x 3 pleasant Lady who lives home alone and was just recently D/C from hospital. Son in room. Following commands: Intact      Cueing Cueing Techniques: Verbal cues  Exercises      General Comments        Pertinent Vitals/Pain Pain Assessment Pain Assessment: Faces Faces Pain Scale: Hurts little more Pain Location: low back chronic Pain Descriptors / Indicators: Aching, Cramping, Discomfort, Squeezing, Sore Pain Intervention(s): Monitored during session, Repositioned    Home Living                          Prior Function            PT Goals (current  goals can now be found in the care plan section) Progress towards PT goals: Progressing toward goals    Frequency    Min 3X/week      PT Plan      Co-evaluation              AM-PAC PT 6 Clicks Mobility   Outcome Measure  Help needed turning from your back to your side while in a flat bed without using bedrails?: A Lot Help needed moving from lying on your back to sitting on the side of a flat bed without using bedrails?: A Lot Help needed moving to and from a bed to a chair (including a wheelchair)?: A Lot Help needed standing up from a chair using your arms (e.g., wheelchair or bedside chair)?: A Lot Help needed to walk in hospital room?: A Lot Help needed climbing 3-5 steps with a railing? : Total 6 Click Score: 11    End of Session Equipment Utilized During Treatment: Gait belt Activity Tolerance: Patient limited by fatigue Patient left: in chair;with call bell/phone within reach;with family/visitor present;with chair alarm set Nurse Communication: Mobility status PT Visit Diagnosis: Unsteadiness on feet (R26.81);History of falling (Z91.81);Other abnormalities of gait and mobility (R26.89)     Time: 1410-1435 PT Time Calculation (min) (ACUTE ONLY): 25 min  Charges:    $Gait Training: 8-22 mins $Therapeutic Activity: 8-22 mins PT General Charges $$ ACUTE PT VISIT: 1 Visit                     Katheryn Leap  PTA Acute  Rehabilitation Services Office M-F          [phone]

## 2024-04-09 DIAGNOSIS — I4811 Longstanding persistent atrial fibrillation: Secondary | ICD-10-CM | POA: Diagnosis not present

## 2024-04-09 DIAGNOSIS — J189 Pneumonia, unspecified organism: Secondary | ICD-10-CM | POA: Diagnosis not present

## 2024-04-09 DIAGNOSIS — Z953 Presence of xenogenic heart valve: Secondary | ICD-10-CM | POA: Diagnosis not present

## 2024-04-09 DIAGNOSIS — J9601 Acute respiratory failure with hypoxia: Secondary | ICD-10-CM | POA: Diagnosis not present

## 2024-04-09 DIAGNOSIS — I5033 Acute on chronic diastolic (congestive) heart failure: Secondary | ICD-10-CM | POA: Diagnosis not present

## 2024-04-09 LAB — CBC
HCT: 29.9 % — ABNORMAL LOW (ref 36.0–46.0)
Hemoglobin: 10.6 g/dL — ABNORMAL LOW (ref 12.0–15.0)
MCH: 37.9 pg — ABNORMAL HIGH (ref 26.0–34.0)
MCHC: 35.5 g/dL (ref 30.0–36.0)
MCV: 106.8 fL — ABNORMAL HIGH (ref 80.0–100.0)
Platelets: 65 10*3/uL — ABNORMAL LOW (ref 150–400)
RBC: 2.8 MIL/uL — ABNORMAL LOW (ref 3.87–5.11)
RDW: 13.3 % (ref 11.5–15.5)
WBC: 8.4 10*3/uL (ref 4.0–10.5)
nRBC: 0 % (ref 0.0–0.2)

## 2024-04-09 LAB — BASIC METABOLIC PANEL WITH GFR
Anion gap: 9 (ref 5–15)
BUN: 33 mg/dL — ABNORMAL HIGH (ref 8–23)
CO2: 32 mmol/L (ref 22–32)
Calcium: 8.9 mg/dL (ref 8.9–10.3)
Chloride: 92 mmol/L — ABNORMAL LOW (ref 98–111)
Creatinine, Ser: 0.84 mg/dL (ref 0.44–1.00)
GFR, Estimated: >60 mL/min (ref >60)
Glucose, Bld: 88 mg/dL (ref 70–99)
Potassium: 3.6 mmol/L (ref 3.5–5.1)
Sodium: 133 mmol/L — ABNORMAL LOW (ref 135–145)

## 2024-04-09 LAB — PATHOLOGIST SMEAR REVIEW

## 2024-04-09 LAB — ACID FAST SMEAR (AFB, MYCOBACTERIA)
Acid Fast Smear: NEGATIVE
Source (AFB): 183518

## 2024-04-09 MED ORDER — LISINOPRIL 5 MG PO TABS
5.0000 mg | ORAL_TABLET | Freq: Every day | ORAL | Status: DC
  Administered 2024-04-10: 5 mg via ORAL
  Filled 2024-04-09: qty 1

## 2024-04-09 NOTE — Progress Notes (Signed)
 PROGRESS NOTE  [name] FMW:[id number] DOB: [birth year] DOA: 04/02/2024 PCP: [name], MD   LOS: 7 days   Brief Narrative / Interim history: 78 y.o. female with medical history significant for atrial fibrillation on Eliquis , anxiety, chronic diastolic congestive heart failure and COPD chronically on room air who was recently discharged from [facility] on 6/13 after a stay for community-acquired pneumonia and combined COPD exacerbation being admitted to the hospital with recurrent acute hypoxic respiratory failure with worsening pneumonia.  History is provided by the patient as well as her granddaughter who is at the bedside, they state that she was discharged home on 6/13 on 2 L nasal cannula oxygen.  She completed a course of oral antibiotics after discharge.  She was doing well at home, had remained on supplemental oxygen.  However over the last 2 to 3 days, patient and family noticed that she developed more of a wet cough, felt more dyspnea with exertion, weakness and unsteadiness on her feet.  She fell at home today prior to admission and tore up the skin on her right arm.  She was initially placed on antibiotics for presumed pneumonia.  Underwent a 2D echo which showed normal LVEF, moderately reduced RV, and repaired/replaced mitral valves showing severe stenosis.  Cardiology was consulted.  Patient has improved and plan is for skilled nursing facility in the a.m.  Subjective / 24h Interval events: Overall feeling better  Assesement and Plan:  Acute hypoxic respiratory failure, possible CAP with parapneumonic effusion-was recently treated for community-acquired pneumonia and discharged on 2 L of oxygen earlier this month.  She is readmitted with dyspnea, cough, ongoing hypoxemia - Has been placed on antibiotics and finished a 7-day course - Has been weaned to room air - Status post left-sided thoracentesis 6/28, fluid appears exudative.  Gram stain without organisms- culture  negative   Acute on chronic diastolic CHF, severe mitral valve stenosis -cardiology consulted and following.  She is felt to be slightly fluid overloaded and has been started on diuretics, continue furosemide  but n.p.o.  In and outs not accurate as she has some unmeasured urine output -  anticoagulation due to concern for thrombosis/mitral valve stenosis- change back to oral  Right arm wound  -with a degree of oozing while being anticoagulated.  General surgery consulted and evaluated patient and bleeding is stopped so they have since signed off -resume oral DOAC  COPD -no wheezing, status post Solu-Medrol  for 4 days.  Continue nebulizers, IS  PAF-continue metoprolol , Eliquis  resumed  Essential hypertension-lisinopril  was held due to soft blood pressure  Acute kidney injury-creatinine back to baseline 0.7, was as high as 1.12  Troponin elevation-flat, not in a pattern consistent with ACS, likely demand ischemia  Chronic thrombocytopenia-beginning to trend up  Scheduled Meds:  apixaban   5 mg Oral BID   budesonide -glycopyrrolate -formoterol   2 puff Inhalation BID   buPROPion   150 mg Oral Daily   Chlorhexidine  Gluconate Cloth  6 each Topical Daily   feeding supplement  237 mL Oral BID BM   furosemide   40 mg Oral Daily   metoprolol  succinate  25 mg Oral QHS   montelukast   10 mg Oral QHS   pravastatin   40 mg Oral q1800   predniSONE   20 mg Oral Q breakfast   traZODone   50 mg Oral QHS   Continuous Infusions:   PRN Meds:.acetaminophen  **OR** acetaminophen , albuterol , guaiFENesin -dextromethorphan , ondansetron  **OR** ondansetron  (ZOFRAN ) IV, mouth rinse, sodium chloride  flush, traMADol , traZODone   Current Outpatient Medications  Medication Instructions  acetaminophen  (TYLENOL ) 1,000 mg, Oral, 2 times daily   albuterol  (VENTOLIN  HFA) 108 (90 Base) MCG/ACT inhaler 2 puffs, Inhalation, Every 6 hours PRN   ascorbic acid  (VITAMIN C) 500 mg, Oral, Daily   Biotin 1,000 mcg, Oral, Daily    buPROPion  (WELLBUTRIN  SR) 150 MG 12 hr tablet TAKE 1 TABLET(150 MG) BY MOUTH EVERY MORNING   Cephalexin  250 mg, Oral, Daily with breakfast   cholecalciferol (VITAMIN D3) 1,000 Units, Oral, Daily   ELIQUIS  5 MG TABS tablet TAKE 1 TABLET(5 MG) BY MOUTH TWICE DAILY   ferrous sulfate  325 mg, Oral, Daily with breakfast   Fluticasone -Umeclidin-Vilant (TRELEGY ELLIPTA ) 200-62.5-25 MCG/ACT AEPB 1 puff, Inhalation, Daily   furosemide  (LASIX ) 40 MG tablet TAKE 1 TABLET BY MOUTH EVERY DAY. MAY TAKE AN EXTRA TABLET AS NEEDED FOR SWELLING   gabapentin  (NEURONTIN ) 300 mg, Oral, Daily at bedtime   gabapentin  (NEURONTIN ) 100 mg, Oral, 2 times daily PRN   Gemtesa  75 mg, Oral, Daily   hydrocortisone  (ANUSOL -HC) 2.5 % rectal cream APPLY RECTALLY TO THE AFFECTED AREA TWICE DAILY   lisinopril  (ZESTRIL ) 5 mg, Oral, Daily   lovastatin  (MEVACOR ) 40 MG tablet TAKE 1 TABLET(40 MG) BY MOUTH AT BEDTIME   metoprolol  succinate (TOPROL  XL) 25 mg, Oral, Every evening   montelukast  (SINGULAIR ) 10 MG tablet TAKE 1 TABLET(10 MG) BY MOUTH AT BEDTIME   Multiple Vitamins-Minerals (PRESERVISION AREDS 2 PO) 2 capsules, Oral, Every morning   Ohtuvayre  3 mg, 2 times daily   omeprazole  (PRILOSEC) 20 mg, Oral, Daily   ondansetron  (ZOFRAN ) 4 mg, Oral, Every 8 hours PRN   potassium chloride  (KLOR-CON ) 10 MEQ tablet TAKE 2 TABLETS(20 MEQ) BY MOUTH DAILY   predniSONE  (DELTASONE ) 20 MG tablet Take by mouth.   traMADol  (ULTRAM ) 50-100 mg, Oral, 2 times daily PRN   traZODone  (DESYREL ) 50 mg, Oral, Daily at bedtime   vitamin B-12 (CYANOCOBALAMIN ) 500 mcg, Oral, 2 times weekly   Zinc  50 mg, Oral, Daily    Diet Orders (From admission, onward)     Start     Ordered   04/02/24 1436  Diet Heart Room service appropriate? Yes; Fluid consistency: Thin  Diet effective now       Question Answer Comment  Room service appropriate? Yes   Fluid consistency: Thin      04/02/24 1435            DVT prophylaxis:  apixaban  (ELIQUIS ) tablet 5  mg   Lab Results  Component Value Date   PLT 65 (L) 04/09/2024      Code Status: Full Code  Family Communication: None at bedside  Status is: Inpatient Remains inpatient appropriate because: Severity of illness   Level of care: Telemetry  Consultants:  General Surgery Cardiology  Objective: Vitals:   04/08/24 2100 04/09/24 0500 04/09/24 0547 04/09/24 0857  BP:   (!) 148/82   Pulse:   75   Resp:   20   Temp:   98.2 F (36.8 C)   TempSrc:   Oral   SpO2: 99%  100% 100%  Weight:  66.4 kg    Height:        Intake/Output Summary (Last 24 hours) at 04/09/2024 1200 Last data filed at 04/09/2024 1149 Gross per 24 hour  Intake --  Output 900 ml  Net -900 ml   Wt Readings from Last 3 Encounters:  04/09/24 66.4 kg  03/29/24 64.6 kg  03/19/24 65 kg    Examination:   General: Appearance:  Overweight female in no acute distress, in bed     Lungs:      respirations unlabored, diminished, not on O2  Heart:    Normal heart rate.   MS:   All extremities are intact.   Neurologic:   Awake, alert     Data Reviewed: I have independently reviewed following labs and imaging studies   CBC Recent Labs  Lab 04/05/24 0935 04/06/24 0939 04/07/24 0401 04/08/24 0037 04/09/24 0419  WBC 12.5* 12.1* 8.5 8.6 8.4  HGB 11.0* 10.8* 9.7* 9.7* 10.6*  HCT 34.2* 31.9* 30.0* 30.1* 29.9*  PLT 63* 69* 56* 54* 65*  MCV 110.7* 107.4* 106.4* 106.4* 106.8*  MCH 35.6* 36.4* 34.4* 34.3* 37.9*  MCHC 32.2 33.9 32.3 32.2 35.5  RDW 13.1 13.2 13.0 12.9 13.3    Recent Labs  Lab 04/02/24 1206 04/03/24 0418 04/04/24 0558 04/05/24 0935 04/06/24 0939 04/07/24 0401 04/08/24 0037 04/09/24 0419  NA  --    < >  --  136 135 135 133* 133*  K  --    < >  --  4.3 4.5 4.1 3.8 3.6  CL  --    < >  --  94* 93* 93* 95* 92*  CO2  --    < >  --  34* 32 33* 32 32  GLUCOSE  --    < >  --  111* 94 91 133* 88  BUN  --    < >  --  42* 42* 39* 39* 33*  CREATININE  --    < >  --  0.99 0.88 0.76 0.88 0.84   CALCIUM   --    < >  --  9.8 9.8 9.4 8.8* 8.9  AST  --   --   --   --   --  19 18  --   ALT  --   --   --   --   --  13 13  --   ALKPHOS  --   --   --   --   --  55 53  --   BILITOT  --   --   --   --   --  0.5 0.7  --   ALBUMIN   --   --   --   --   --  2.4* 2.3*  --   MG  --   --   --   --   --   --  2.0  --   PROCALCITON  --   --  <0.10  --   --   --   --   --   AMMONIA 19  --   --   --   --   --   --   --   BNP 365.5*  --   --   --   --   --   --   --    < > = values in this interval not displayed.    ------------------------------------------------------------------------------------------------------------------ No results for input(s): CHOL, HDL, LDLCALC, TRIG, CHOLHDL, LDLDIRECT in the last 72 hours.  Lab Results  Component Value Date   HGBA1C 5.5 12/06/2013   ------------------------------------------------------------------------------------------------------------------ No results for input(s): TSH, T4TOTAL, T3FREE, THYROIDAB in the last 72 hours.  Invalid input(s): FREET3  Cardiac Enzymes No results for input(s): CKMB, TROPONINI, MYOGLOBIN in the last 168 hours.  Invalid input(s): CK ------------------------------------------------------------------------------------------------------------------    Component Value Date/Time   BNP 365.5 (H) 04/02/2024 1206  CBG: No results for input(s): GLUCAP in the last 168 hours.  Recent Results (from the past 240 hours)  Urine Culture     Status: None   Collection Time: 04/02/24  2:53 PM   Specimen: Urine, Random  Result Value Ref Range Status   Specimen Description   Final    URINE, RANDOM Performed at [facility], [address], [place], [place] [address]    Special Requests   Final    NONE Reflexed from [id number] Performed at [facility], [address], [place], [place] [address]    Culture   Final    NO GROWTH Performed at [facility], [address] [place], [place] [address]    Report Status 04/03/2024 FINAL  Final  MRSA Next Gen by PCR, Nasal     Status: None   Collection Time: 04/02/24  6:10 PM   Specimen: Nasal Mucosa; Nasal Swab  Result Value Ref Range Status   MRSA by PCR Next Gen NOT DETECTED NOT DETECTED Final    Comment: (NOTE) The GeneXpert MRSA Assay (FDA approved for NASAL specimens only), is one component of a comprehensive MRSA colonization surveillance program. It is not intended to diagnose MRSA infection nor to guide or monitor treatment for MRSA infections. Test performance is not FDA approved in patients less than 34 years old. Performed at [facility], [address], [place], [place] [address]   Culture, blood (Routine X 2) w Reflex to ID Panel     Status: None   Collection Time: 04/02/24  6:33 PM   Specimen: BLOOD RIGHT ARM  Result Value Ref Range Status   Specimen Description   Final    BLOOD RIGHT ARM Performed at [facility], [address] [place], [place] [address]    Special Requests   Final    BOTTLES DRAWN AEROBIC ONLY Blood Culture adequate volume Performed at [facility], [address], [place], [place] [address]    Culture   Final    NO GROWTH 5 DAYS Performed at [facility], [address], [place], [place] [address]    Report Status 04/07/2024 FINAL  Final  Culture, blood (Routine X 2) w Reflex to ID Panel     Status: None   Collection Time: 04/02/24  6:34 PM   Specimen: BLOOD RIGHT ARM  Result Value Ref Range Status   Specimen Description   Final    BLOOD RIGHT ARM Performed at [facility], [address] [place], [place] [address]    Special Requests   Final    BOTTLES DRAWN AEROBIC ONLY Blood Culture adequate volume Performed at [facility], [address], [place], [place] [address]    Culture   Final    NO GROWTH 5 DAYS Performed at [facility], [address] [place], [place] [address]    Report Status 04/07/2024 FINAL  Final  Culture, Fungus without Smear     Status: None (Preliminary result)   Collection Time: 04/06/24 11:21 AM   Specimen: PATH Cytology Pleural fluid  Result Value Ref Range Status   Specimen Description   Final    PERITONEAL Performed at [facility], [address], [place], [place] [address]    Special Requests   Final    NONE Performed at [facility], [address] [place], [place] [address]    Culture  Final    NO FUNGUS ISOLATED AFTER 18 DAYS Performed at [facility], [address] [place], [place] [address]    Report Status PENDING  Incomplete  Acid Fast Smear (AFB)     Status: None   Collection Time: 04/06/24 11:21 AM   Specimen: PATH Cytology Pleural fluid  Result Value Ref Range Status   AFB Specimen Processing Direct Inoculation  Final   Acid Fast Smear Negative  Final    Comment: (NOTE) Performed At: [facility] [address] [place], [place] [id number] Jennette Shorter MD Ey:[id number]    Source (AFB) [phone]  Final    Comment: Performed at [facility], [address], [place], [place] [address]  Body fluid culture w Gram Stain     Status: None (Preliminary result)   Collection Time: 04/06/24 11:21 AM   Specimen: PATH Cytology Pleural fluid  Result Value Ref Range Status   Specimen Description   Final    PERITONEAL Performed at [facility], [address], [place], [place] [address]    Special Requests   Final    NONE Performed at [facility], [address], [place], [place] [address]    Gram Stain   Final    RARE WBC PRESENT, PREDOMINANTLY PMN NO ORGANISMS SEEN    Culture   Final    NO GROWTH 3 DAYS Performed at [facility], [address], [place], [place] [address]    Report Status PENDING  Incomplete     Radiology Studies: No results  found.    Harlene Bowl DO [facility]  Between 7 am - 7 pm I am available, please contact me via Amion (for emergencies) or Securechat (non urgent messages)  Between 7 pm - 7 am I am not available, please contact night coverage MD/APP via Amion

## 2024-04-09 NOTE — Progress Notes (Signed)
 Occupational Therapy Treatment Patient Details Name: [name] MRN: [id number] DOB: 1946/01/02 Today's Date: 04/09/2024   History of present illness 78 yr old female  who was recently discharged from [facility] on 6/13 after a stay for community-acquired pneumonia and combined COPD exacerbation, being admitted to the hospital with recurrent acute hypoxic respiratory failure with worsening pneumonia. Pt with medical history significant for atrial fibrillation, anxiety, chronic diastolic congestive heart failure and COPD chronically.   OT comments  The pt was seen for functional strengthening and functional endurance training needed to facilitate improved ADL performance. She required CGA to perform 2 sit to stand transfers using a RW. She subsequently required steadying assist for ambulation in her room using a RW, with OT emphasizing implementing therapeutic rest breaks and pursed lip breathing exercises as needed. Her O2 saturation was noted to be 97% on room air at rest, and 90% on room air with activity. She is making gradual functional progress. Continue OT plan of care. Patient will benefit from continued inpatient follow up therapy, <3 hours/day.       If plan is discharge home, recommend the following:  Help with stairs or ramp for entrance;Assistance with cooking/housework;Assist for transportation;A little help with walking and/or transfers;A little help with bathing/dressing/bathroom   Equipment Recommendations  Other (comment) (defer to next level of care)    Recommendations for Other Services      Precautions / Restrictions Precautions Precautions: Fall Restrictions Weight Bearing Restrictions Per Provider Order: No Other Position/Activity Restrictions: monitor O2 saturation       Mobility Bed Mobility      General bed mobility comments: pt was received seated in the chair    Transfers Overall transfer level: Needs assistance Equipment used: Rolling walker (2  wheels) Transfers: Sit to/from Stand Sit to Stand: Contact guard assist                 Balance     Sitting balance-Leahy Scale: Good       Standing balance-Leahy Scale: Fair           ADL either performed or assessed with clinical judgement   ADL       Grooming: Set up;Sitting Grooming Details (indicate cue type and reason): Pt drank from a cup seated in a chair.         Upper Body Dressing : Set up;Sitting          Vision Baseline Vision/History: 1 Wears glasses           Communication Communication Factors Affecting Communication: Hearing impaired   Cognition Arousal: Alert Behavior During Therapy: WFL for tasks assessed/performed Cognition: No apparent impairments      Following commands: Intact                      Pertinent Vitals/ Pain       Pain Assessment Pain Assessment: 0-10 Pain Score: 7  Pain Location: right arm Pain Intervention(s): Monitored during session, Limited activity within patient's tolerance   Frequency  Min 2X/week        Progress Toward Goals  OT Goals(current goals can now be found in the care plan section)  Progress towards OT goals: Progressing toward goals  Acute Rehab OT Goals OT Goal Formulation: With patient Time For Goal Achievement: 04/17/24 Potential to Achieve Goals: Good  Plan         AM-PAC OT 6 Clicks Daily Activity     Outcome Measure   Help from another  person eating meals?: None Help from another person taking care of personal grooming?: A Little Help from another person toileting, which includes using toliet, bedpan, or urinal?: A Lot Help from another person bathing (including washing, rinsing, drying)?: A Lot Help from another person to put on and taking off regular upper body clothing?: A Little Help from another person to put on and taking off regular lower body clothing?: A Little 6 Click Score: 17    End of Session Equipment Utilized During Treatment: Rolling walker  (2 wheels)  OT Visit Diagnosis: Muscle weakness (generalized) (M62.81);History of falling (Z91.81);Pain Pain - Right/Left: Right Pain - part of body: Arm   Activity Tolerance Other (comment) (Fair+ tolerance)   Patient Left in chair;with call bell/phone within reach;with chair alarm set   Nurse Communication Mobility status        Time: 8579-8560 OT Time Calculation (min): 19 min  Charges: OT General Charges $OT Visit: 1 Visit OT Treatments $Therapeutic Activity: 8-22 mins      Delanna LITTIE Molt, OTR/L 04/09/2024, 3:22 PM

## 2024-04-09 NOTE — TOC Progression Note (Addendum)
 Transition of Care [facility]) - Progression Note    Patient Details  Name: [name] MRN: [id number] Date of Birth: 30-Jul-1946  Transition of Care [facility]) CM/SW Contact  Tawni CHRISTELLA Eva, LCSW Phone Number: 04/09/2024, 9:58 AM  Clinical Narrative:     CSW submitted insurance authorization and it was approved for [facility] ID# [id number] ,04/10/2024-04/12/2024. CSW attempted to contact facility to inform them of auth , no answer left VM requesting a return call. TOC to follow.   ADDEN 10:30am CSW spoke with Alfonso admission with [facility], she reports they can accept pt tomorrow morning. CSW spoke with pt's friends [name] and provided an update. TOC to follow   Expected Discharge Plan: Skilled Nursing Facility   Expected Discharge Plan and Services       Living arrangements for the past 2 months: Single Family Home                                       Social Determinants of Health (SDOH) Interventions SDOH Screenings   Food Insecurity: No Food Insecurity (04/02/2024)  Housing: Low Risk  (04/02/2024)  Transportation Needs: No Transportation Needs (04/02/2024)  Utilities: Not At Risk (04/02/2024)  Alcohol  Screen: Low Risk  (11/03/2023)  Depression (PHQ2-9): Medium Risk (03/14/2024)  Financial Resource Strain: Low Risk  (11/03/2023)  Physical Activity: Unknown (11/03/2023)  Social Connections: Moderately Integrated (04/02/2024)  Stress: No Stress Concern Present (11/03/2023)  Tobacco Use: Medium Risk (04/04/2024)    Readmission Risk Interventions    04/03/2024    9:08 AM 03/22/2024   10:40 AM  Readmission Risk Prevention Plan  Transportation Screening Complete Complete  PCP or Specialist Appt within 5-7 Days  Complete  PCP or Specialist Appt within 3-5 Days Complete   Home Care Screening  Complete  Medication Review (RN CM)  Complete  HRI or Home Care Consult Complete   Social Work Consult for Recovery Care Planning/Counseling Complete   Palliative Care  Screening Not Applicable   Medication Review Oceanographer) Complete

## 2024-04-09 NOTE — Progress Notes (Signed)
 Progress Note  Patient Name: [name] Date of Encounter: 04/09/2024 [facility] Cardiologist: [name], MD   Interval Summary   Patient seen and examined at bedside. Denies anginal chest pain or heart failure symptoms. Compared to yesterday she is less tired and fatigued. Anticipated d/c in the am, per patient  Vital Signs Vitals:   04/09/24 0547 04/09/24 0857 04/09/24 1223 04/09/24 2032  BP: (!) 148/82  116/69 110/66  Pulse: 75  82 71  Resp: 20  14 20   Temp: 98.2 F (36.8 C)  98.3 F (36.8 C) 97.9 F (36.6 C)  TempSrc: Oral  Oral   SpO2: 100% 100% 91% 92%  Weight:      Height:        Intake/Output Summary (Last 24 hours) at 04/09/2024 2337 Last data filed at 04/09/2024 2143 Gross per 24 hour  Intake 120 ml  Output 1450 ml  Net -1330 ml      04/09/2024    5:00 AM 04/08/2024    5:00 AM 04/07/2024    5:00 AM  Last 3 Weights  Weight (lbs) 146 lb 6.2 oz 147 lb 12.8 oz 148 lb 1.6 oz  Weight (kg) 66.4 kg 67.042 kg 67.178 kg      Telemetry/ECG  AV paced- Personally Reviewed  Physical Exam  GEN: No acute distress.   Neck: No JVD Cardiac: RRR, 3 out of 6 systolic ejection murmur, no  rubs, or gallops.  Respiratory: Decreased breath sounds bilaterally.  No wheezes rales or rhonchi's. GI: Soft, nontender, non-distended  MS: No edema Skin: Diffuse ecchymoses.  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   Stage C, NYHA class II/III Continue Lasix  40 mg p.o. daily. Continue Toprol -XL 25 mg p.o. daily. Prior office visits weights have been around 145-148 pounds, today she is 146# Will add Lisinopril  5mg  po qAM , hold if SBP <130 mmHG (hopefully will avoid hypotension, AKI, fall /orthostasis given the hot summer months) POA: Lisinopril  5 mg p.o. daily, Lasix  40 mg p.o. daily Home prior echocardiograms noted mild mitral  stenosis, most recent echocardiogram report severe mitral stenosis with a mean gradient of 13 mmHg.  Consider repeating a limited echocardiogram as outpatient when she is euvolemic.  Acute hypoxic respiratory failure, likely secondary to CAP / parapneumonic effusions Status post left-sided thoracentesis, 400 cc  Persistent atrial fibrillation: Currently paced. History of cardioversion November 2024 Rate control metoprolol . Rhythm control: N/A Thromboembolic prophylaxis: Eliquis . Was on Coumadin  in the past but due to excessive bruising and difficulty maintaining therapeutic INR levels she was transitioned to Eliquis  back in February 2022.    Acute kidney injury: Resolved  Elevated troponins: Likely demand ischemia not consistent w/ ACS  Ascending aortic dilatation: Known history of. Primary cardiologist recommends no follow-up as she would not be a ideal candidate for redo sternotomy. Focus on blood pressure management and avoid antibiotic such as ciprofloxacin .  [facility] will sign off.   Medication Recommendations:   Lasix  40 mg p.o. daily. Toprol -XL 25 mg p.o. daily with holding parameters. Lisinopril  5 mg p.o. every morning with holding parameters, See above. Eliquis  5 mg p.o. twice daily, may need to be renally dosed, in the future  Other recommendations (labs, testing, etc): N/A  Follow up as an outpatient: Has an appointment with her primary cardiologist in August 2025    For questions or updates, please contact  [facility] Please consult [email or web address] for contact info under       Signed, Madonna Michele HAS, [facility]  A Division of [facility] [address] [place], [place] [address]  [place], [place] [address]   Addendum:  Corrected the dose of lasix  (under the sign off section) it said 20 mg (typo) changed to what she is getting currently.   Gavin Faivre Benicia, DO, FACC

## 2024-04-10 ENCOUNTER — Telehealth: Payer: Self-pay | Admitting: Orthopedic Surgery

## 2024-04-10 ENCOUNTER — Telehealth: Payer: Self-pay | Admitting: Cardiovascular Disease

## 2024-04-10 DIAGNOSIS — R52 Pain, unspecified: Secondary | ICD-10-CM

## 2024-04-10 DIAGNOSIS — J189 Pneumonia, unspecified organism: Secondary | ICD-10-CM | POA: Diagnosis not present

## 2024-04-10 LAB — CBC
HCT: 29.7 % — ABNORMAL LOW (ref 36.0–46.0)
Hemoglobin: 10.3 g/dL — ABNORMAL LOW (ref 12.0–15.0)
MCH: 36.1 pg — ABNORMAL HIGH (ref 26.0–34.0)
MCHC: 34.7 g/dL (ref 30.0–36.0)
MCV: 104.2 fL — ABNORMAL HIGH (ref 80.0–100.0)
Platelets: 74 10*3/uL — ABNORMAL LOW (ref 150–400)
RBC: 2.85 MIL/uL — ABNORMAL LOW (ref 3.87–5.11)
RDW: 13.2 % (ref 11.5–15.5)
WBC: 8.2 10*3/uL (ref 4.0–10.5)
nRBC: 0 % (ref 0.0–0.2)

## 2024-04-10 LAB — BODY FLUID CULTURE W GRAM STAIN: Culture: NO GROWTH

## 2024-04-10 LAB — BASIC METABOLIC PANEL WITH GFR
Anion gap: 9 (ref 5–15)
BUN: 28 mg/dL — ABNORMAL HIGH (ref 8–23)
CO2: 31 mmol/L (ref 22–32)
Calcium: 8.9 mg/dL (ref 8.9–10.3)
Chloride: 95 mmol/L — ABNORMAL LOW (ref 98–111)
Creatinine, Ser: 0.76 mg/dL (ref 0.44–1.00)
GFR, Estimated: >60 mL/min (ref >60)
Glucose, Bld: 90 mg/dL (ref 70–99)
Potassium: 3.5 mmol/L (ref 3.5–5.1)
Sodium: 135 mmol/L (ref 135–145)

## 2024-04-10 MED ORDER — TRAMADOL HCL 50 MG PO TABS
50.0000 mg | ORAL_TABLET | Freq: Two times a day (BID) | ORAL | Status: DC | PRN
Start: 2024-04-10 — End: 2024-04-12

## 2024-04-10 NOTE — TOC Transition Note (Signed)
 Transition of Care [facility]) - Discharge Note   Patient Details  Name: [name] MRN: [id number] Date of Birth: 09/07/1946  Transition of Care [facility]) CM/SW Contact:  Tawni CHRISTELLA Eva, LCSW Phone Number: 04/10/2024, 9:33 AM   Clinical Narrative:     Pt to d/c to [facility] rehab, pt room 109 RN to call report to [phone]. Spoke with pt's friend [name] who agreed with d/c plan . [facility] called , TOC sign off.   Final next level of care: Skilled Nursing Facility Barriers to Discharge: Barriers Resolved   Patient Goals and CMS Choice Patient states their goals for this hospitalization and ongoing recovery are:: sNF to get stonger          Discharge Placement                    Patient and family notified of of transfer: 04/10/24  Discharge Plan and Services Additional resources added to the After Visit Summary for                                       Social Drivers of Health (SDOH) Interventions SDOH Screenings   Food Insecurity: No Food Insecurity (04/02/2024)  Housing: Low Risk  (04/02/2024)  Transportation Needs: No Transportation Needs (04/02/2024)  Utilities: Not At Risk (04/02/2024)  Alcohol  Screen: Low Risk  (11/03/2023)  Depression (PHQ2-9): Medium Risk (03/14/2024)  Financial Resource Strain: Low Risk  (11/03/2023)  Physical Activity: Unknown (11/03/2023)  Social Connections: Moderately Integrated (04/02/2024)  Stress: No Stress Concern Present (11/03/2023)  Tobacco Use: Medium Risk (04/04/2024)     Readmission Risk Interventions    04/03/2024    9:08 AM 03/22/2024   10:40 AM  Readmission Risk Prevention Plan  Transportation Screening Complete Complete  PCP or Specialist Appt within 5-7 Days  Complete  PCP or Specialist Appt within 3-5 Days Complete   Home Care Screening  Complete  Medication Review (RN CM)  Complete  HRI or Home Care Consult Complete   Social Work Consult for Recovery Care Planning/Counseling Complete   Palliative  Care Screening Not Applicable   Medication Review Oceanographer) Complete

## 2024-04-10 NOTE — Care Management Important Message (Signed)
 Important Message  Patient Details IM Letter given. Name: [name] MRN: [id number] Date of Birth: Aug 30, 1946   Important Message Given:  Yes - Medicare IM     [name] 04/10/2024, 10:53 AM

## 2024-04-10 NOTE — Discharge Summary (Signed)
 Physician Discharge Summary  [name] FMW:[id number] DOB: July 06, 1946 DOA: 04/02/2024  PCP: [name], MD  Admit date: 04/02/2024 Discharge date: 04/10/2024 30 Day Unplanned Readmission Risk Score    Flowsheet Row ED to Hosp-Admission (Current) from 04/02/2024 in [facility]  30 Day Unplanned Readmission Risk Score (%) 23.47 Filed at 04/10/2024 0801    This score is the patient's risk of an unplanned readmission within 30 days of being discharged (0 -100%). The score is based on dignosis, age, lab data, medications, orders, and past utilization.   Low:  0-14.9   Medium: 15-21.9   High: 22-29.9   Extreme: 30 and above          Admitted From: Home Disposition: SNF  Recommendations for Outpatient Follow-up:  Follow up with PCP in 1-2 weeks Please obtain BMP/CBC in one week Follow-up with primary cardiologist per their scheduled time and date Please follow up with your PCP on the following pending results: Unresulted Labs (From admission, onward)     Start     Ordered   04/06/24 1123  Fungus Culture With Stain  RELEASE UPON ORDERING,   TIMED        04/06/24 1123   04/06/24 1123  Acid Fast Culture with reflexed sensitivities  RELEASE UPON ORDERING,   TIMED        04/06/24 1123   04/06/24 0500  CBC  Daily,   R     Question:  Specimen collection method  Answer:  Lab=Lab collect   04/05/24 1109              Home Health: None Equipment/Devices: None  Discharge Condition: Stable CODE STATUS: Full code Diet recommendation: Low-sodium  Subjective: Seen and examined.  No complaints at all.  She is excited to be discharged to SNF today.  Brief/Interim Summary: 78 y.o. female with medical history significant for atrial fibrillation on Eliquis , anxiety, chronic diastolic congestive heart failure and COPD chronically on room air who was recently discharged from [facility] on 6/13 after a stay for community-acquired pneumonia and  combined COPD exacerbation admitted to the hospital with recurrent acute hypoxic respiratory failure with worsening pneumonia.  She completed a course of oral antibiotics after discharge.  She was doing well at home, had remained on supplemental oxygen.  However over the last 2 to 3 days, patient and family noticed that she developed more of a wet cough, felt more dyspnea with exertion, weakness and unsteadiness on her feet.  She fell at home prior to admission and tore up the skin on her right arm.  She was initially placed on antibiotics for presumed pneumonia.  Underwent a 2D echo which showed normal LVEF, moderately reduced RV, and repaired/replaced mitral valves showing severe stenosis.  Cardiology was consulted.  Details of hospitalization as below.   Acute hypoxic respiratory failure, possible CAP with parapneumonic effusion-was recently treated for community-acquired pneumonia and discharged on 2 L of oxygen earlier this month.  She is readmitted with dyspnea, cough, ongoing hypoxemia - Has been placed on antibiotics and finished a 7-day course - Has been weaned to room air - Status post left-sided thoracentesis 6/28, fluid appears exudative.  Gram stain without organisms- culture negative   Acute on chronic diastolic CHF, severe mitral valve stenosis -cardiology consulted and following.  She is felt to be slightly fluid overloaded and has been started on diuretics, continue furosemide . -  anticoagulation due to concern for thrombosis/mitral valve stenosis- change back to oral  Right arm wound  -with a degree of oozing while being anticoagulated.  General surgery consulted and evaluated patient and bleeding is stopped so they have since signed off -resume oral DOAC   COPD -no wheezing, status post Solu-Medrol  for 4 days.  Continue nebulizers,   PAF-continue metoprolol , Eliquis  resumed   Essential hypertension-lisinopril  was held due to soft blood pressure but this is being resumed now that  blood pressure is normal and cardiology also recommended resuming this.   Acute kidney injury-creatinine back to baseline 0.7, was as high as 1.12   Troponin elevation-flat, not in a pattern consistent with ACS, likely demand ischemia   Chronic thrombocytopenia-beginning to trend up, no bleeding.  Discharge Diagnoses:  Principal Problem:   Multifocal pneumonia Active Problems:   Acute hypoxic respiratory failure (HCC)   Elevated troponin   Longstanding persistent atrial fibrillation (HCC)   History of aortic valve replacement with bioprosthetic valve   History of mitral valve replacement with bioprosthetic valve   Ascending aorta dilatation (HCC)   AKI (acute kidney injury) (HCC)   Acute on chronic heart failure with preserved ejection fraction (HFpEF) (HCC)    Discharge Instructions   Allergies as of 04/10/2024       Reactions   Tape Other (See Comments)   THICK TAPES PULLS OFF MY SKIN   Codeine Nausea Only   Prednisone  Nausea Only   Sulfa Antibiotics Nausea Only        Medication List     STOP taking these medications    amoxicillin -clavulanate 875-125 MG tablet Commonly known as: AUGMENTIN    Cephalexin  250 MG tablet   hydrocortisone  2.5 % rectal cream Commonly known as: ANUSOL -HC   Ohtuvayre  3 MG/2.5ML Susp Generic drug: Ensifentrine    ondansetron  4 MG tablet Commonly known as: Zofran    predniSONE  20 MG tablet Commonly known as: DELTASONE    traMADol  50 MG tablet Commonly known as: ULTRAM        TAKE these medications    acetaminophen  500 MG tablet Commonly known as: TYLENOL  Take 2 tablets (1,000 mg total) by mouth in the morning and at bedtime. What changed: when to take this   albuterol  108 (90 Base) MCG/ACT inhaler Commonly known as: VENTOLIN  HFA Inhale 2 puffs into the lungs every 6 (six) hours as needed for wheezing.   ascorbic acid  500 MG tablet Commonly known as: VITAMIN C Take 1 tablet (500 mg total) by mouth daily.   Biotin  1000 MCG tablet Take 1,000 mcg by mouth daily.   buPROPion  150 MG 12 hr tablet Commonly known as: WELLBUTRIN  SR TAKE 1 TABLET(150 MG) BY MOUTH EVERY MORNING   cholecalciferol 25 MCG (1000 UNIT) tablet Commonly known as: VITAMIN D3 Take 1,000 Units by mouth daily.   Eliquis  5 MG Tabs tablet Generic drug: apixaban  TAKE 1 TABLET(5 MG) BY MOUTH TWICE DAILY   ferrous sulfate  325 (65 FE) MG EC tablet Take 1 tablet (325 mg total) by mouth daily with breakfast.   furosemide  40 MG tablet Commonly known as: LASIX  TAKE 1 TABLET BY MOUTH EVERY DAY. MAY TAKE AN EXTRA TABLET AS NEEDED FOR SWELLING What changed: See the new instructions.   gabapentin  300 MG capsule Commonly known as: NEURONTIN  Take 300 mg by mouth at bedtime. What changed: Another medication with the same name was changed. Make sure you understand how and when to take each.   gabapentin  100 MG capsule Commonly known as: NEURONTIN  Take 1 capsule (100 mg total) by mouth 2 (two) times daily as needed (back  pain - for daytime use). What changed: when to take this   Gemtesa  75 MG Tabs Generic drug: Vibegron  Take 75 mg by mouth daily.   lisinopril  5 MG tablet Commonly known as: ZESTRIL  Take 5 mg by mouth daily.   lovastatin  40 MG tablet Commonly known as: MEVACOR  TAKE 1 TABLET(40 MG) BY MOUTH AT BEDTIME What changed: See the new instructions.   metoprolol  succinate 25 MG 24 hr tablet Commonly known as: Toprol  XL Take 1 tablet (25 mg total) by mouth every evening. What changed: when to take this   montelukast  10 MG tablet Commonly known as: SINGULAIR  TAKE 1 TABLET(10 MG) BY MOUTH AT BEDTIME What changed: See the new instructions.   omeprazole  20 MG capsule Commonly known as: PRILOSEC Take 20 mg by mouth daily.   potassium chloride  10 MEQ tablet Commonly known as: KLOR-CON  TAKE 2 TABLETS(20 MEQ) BY MOUTH DAILY   PRESERVISION AREDS 2 PO Take 2 capsules by mouth in the morning.   traZODone  50 MG  tablet Commonly known as: DESYREL  Take 1 tablet (50 mg total) by mouth at bedtime.   Trelegy Ellipta  200-62.5-25 MCG/ACT Aepb Generic drug: Fluticasone -Umeclidin-Vilant Inhale 1 puff into the lungs daily.   vitamin B-12 500 MCG tablet Commonly known as: CYANOCOBALAMIN  Take 500 mcg by mouth 2 (two) times a week.   Zinc  50 MG Caps Take 1 capsule (50 mg total) by mouth daily.        Contact information for follow-up providers     Croitoru, Mihai, MD Follow up on 05/30/2024.   Specialty: Cardiology Why: Keep previously scheduled Cardiology follow-up for 05/30/2024 at 9:40 AM. Contact information: [address] [place] [address] [phone]         [name], MD Follow up in 1 week(s).   Specialty: Family Medicine Contact information: [address] [place] [address] [phone]              Contact information for after-discharge care     [facility]   Service: Skilled Nursing Contact information: [address] [place]  [phone]                    Allergies  Allergen Reactions   Tape Other (See Comments)    THICK TAPES PULLS OFF MY SKIN   Codeine Nausea Only   Prednisone  Nausea Only   Sulfa Antibiotics Nausea Only    Consultations: Cardiology and general surgery   Procedures/Studies: US  THORACENTESIS ASP PLEURAL SPACE W/IMG GUIDE Result Date: 04/06/2024 INDICATION: 142230 Pleural effusion 142230 Shortness of breath. Left-sided pleural effusion. Request for diagnostic and therapeutic thoracentesis. EXAM: ULTRASOUND GUIDED LEFT THORACENTESIS MEDICATIONS: 1% plain lidocaine , 4 mL COMPLICATIONS: None immediate. PROCEDURE: An ultrasound guided thoracentesis was thoroughly discussed with the patient and questions answered. The benefits, risks, alternatives and complications were also discussed. The patient understands and wishes to proceed with the procedure.  Written consent was obtained. Ultrasound was performed to localize and mark an adequate pocket of fluid in the left chest. The area was then prepped and draped in the normal sterile fashion. 1% Lidocaine  was used for local anesthesia. Under ultrasound guidance a 6 Fr Safe-T-Centesis catheter was introduced. Thoracentesis was performed. The catheter was removed and a dressing applied. FINDINGS: A total of approximately 400 mL of hazy, serosanguineous fluid was removed. Samples were sent to the laboratory as requested by the clinical team. IMPRESSION: Successful ultrasound guided LEFT thoracentesis yielding 400  mL of pleural fluid. Procedure performed by Franky Rusk PA-C and supervised by Dr. Thom Hall Electronically Signed   By: Thom Hall [name]   On: 04/06/2024 15:00   DG Chest Port 1 View Result Date: 04/06/2024 CLINICAL DATA:  [id number] Status post thoracentesis [id number] EXAM: PORTABLE CHEST - 1 VIEW COMPARISON:  04/05/2024 FINDINGS: The apices are excluded despite repeating the exposure. No visualized pneumothorax. Interval decrease in left pleural effusion. Persistent blunting of the right lateral costophrenic angle. Mild coarse interstitial and alveolar opacities bilaterally as before, with some patchy consolidation/atelectasis in the lung bases, improved on the left since prior study. Heart size and mediastinal contours are within normal limits. Post AVR. Stable left subclavian transvenous pacemaker. Sternotomy wires. IMPRESSION: 1. No pneumothorax status post left thoracentesis. 2. Persistent right pleural effusion and bibasilar consolidation/atelectasis. Electronically Signed   By: JONETTA Faes [name]   On: 04/06/2024 12:28   DG CHEST PORT 1 VIEW Result Date: 04/05/2024 CLINICAL DATA:  Pleural effusion EXAM: PORTABLE CHEST 1 VIEW COMPARISON:  04/05/2024 FINDINGS: LEFT-sided pacemaker overlies normal cardiac silhouette. Midline sternotomy. Moderate LEFT pleural effusion is noted. Interval improvement in  RIGHT lower lobe airspace disease seen on comparison exam IMPRESSION: 1. Persistent but improved RIGHT lobe airspace disease. 2. Persistent LEFT lung effusion. Electronically Signed   By: Jackquline Boxer [name]   On: 04/05/2024 14:37   ECHOCARDIOGRAM COMPLETE Result Date: 04/04/2024    ECHOCARDIOGRAM REPORT   Patient Name:   [name] Date of Exam: 04/04/2024 Medical Rec #:  [id number]            Height:       64.0 in Accession #:    [id number]           Weight:       141.1 lb Date of Birth:  [birth year]            BSA:          1.687 m Patient Age:    22 years             BP:           139/63 mmHg Patient Gender: F                    HR:           70 bpm. Exam Location:  Inpatient Procedure: 2D Echo, Cardiac Doppler and Color Doppler (Both Spectral and Color            Flow Doppler were utilized during procedure). Indications:    R06.02 SOB  History:        Patient has prior history of Echocardiogram examinations, most                 recent 08/29/2023. Abnormal ECG and Pacemaker, COPD, Aortic                 Valve Disease and Mitral Valve Disease, Arrythmias:Atrial                 Fibrillation, Signs/Symptoms:Chest Pain, Shortness of Breath,                 Dyspnea, Edema and Syncope; Risk Factors:Hypertension and                 Dyslipidemia. Cancer. PFO. MVR. AVR.                 Aortic Valve: 21 mm Magna Ease valve is present in the  aortic                 position. Procedure Date: 12/08/2013.                 Mitral Valve: 25 mm bioprosthetic valve valve is present in the                 mitral position. Procedure Date: 12/08/2013.  Sonographer:    Ellouise Mose RDCS Referring Phys: [phone] JESSICA U [facility]  Sonographer Comments: Technically difficult study due to poor echo windows. Patient could not lie back. IMPRESSIONS  1. Left ventricular ejection fraction, by estimation, is 65 to 70%. The left ventricle has normal function. The left ventricle has no regional wall motion abnormalities. There is moderate  concentric left ventricular hypertrophy. Left ventricular diastolic parameters are indeterminate.  2. Right ventricular systolic function is moderately reduced. The right ventricular size is mildly enlarged. There is severely elevated pulmonary artery systolic pressure. The estimated right ventricular systolic pressure is 61.9 mmHg.  3. Right atrial size was mildly dilated.  4. The mitral valve has been repaired/replaced. No evidence of mitral valve regurgitation. Severe mitral stenosis. The mean mitral valve gradient is 13.0 mmHg. There is a 25 mm bioprosthetic valve present in the mitral position. Procedure Date: 12/08/2013. Echo findings are consistent with stenosis the mitral prosthesis.  5. Large pleural effusion.  6. Tricuspid valve regurgitation is moderate.  7. The aortic valve has been repaired/replaced. Aortic valve regurgitation is not visualized. Aortic valve sclerosis is present, with no evidence of aortic valve stenosis. There is a 21 mm Magna Ease valve present in the aortic position. Procedure Date:  12/08/2013.  8. Aortic dilatation noted. There is mild dilatation of the ascending aorta, measuring 42 mm.  9. The inferior vena cava is dilated in size with >50% respiratory variability, suggesting right atrial pressure of 8 mmHg. FINDINGS  Left Ventricle: Left ventricular ejection fraction, by estimation, is 65 to 70%. The left ventricle has normal function. The left ventricle has no regional wall motion abnormalities. The left ventricular internal cavity size was small. There is moderate  concentric left ventricular hypertrophy. Left ventricular diastolic function could not be evaluated due to mitral valve replacement. Left ventricular diastolic parameters are indeterminate. Right Ventricle: The right ventricular size is mildly enlarged. No increase in right ventricular wall thickness. Right ventricular systolic function is moderately reduced. There is severely elevated pulmonary artery systolic pressure.  The tricuspid regurgitant velocity is 3.67 m/s, and with an assumed right atrial pressure of 8 mmHg, the estimated right ventricular systolic pressure is 61.9 mmHg. Left Atrium: Left atrial size was not well visualized. Right Atrium: Right atrial size was mildly dilated. Pericardium: There is no evidence of pericardial effusion. Mitral Valve: The mitral valve has been repaired/replaced. No evidence of mitral valve regurgitation. There is a 25 mm bioprosthetic valve present in the mitral position. Procedure Date: 12/08/2013. Echo findings are consistent with stenosis of the mitral prosthesis. Severe mitral valve stenosis. MV peak gradient, 22.5 mmHg. The mean mitral valve gradient is 13.0 mmHg. Tricuspid Valve: The tricuspid valve is normal in structure. Tricuspid valve regurgitation is moderate . No evidence of tricuspid stenosis. Aortic Valve: Acceleration time borderline, likely normal valve function. The aortic valve has been repaired/replaced. Aortic valve regurgitation is not visualized. Aortic valve sclerosis is present, with no evidence of aortic valve stenosis. Aortic valve mean gradient measures 13.0 mmHg. Aortic valve peak gradient measures 24.1 mmHg. Aortic valve area, by VTI measures 1.32  cm. There is a 21 mm Magna Ease valve present in the aortic position. Procedure Date: 12/08/2013. Pulmonic Valve: The pulmonic valve was normal in structure. Pulmonic valve regurgitation is mild. No evidence of pulmonic stenosis. Aorta: The aortic root is normal in size and structure and aortic dilatation noted. There is mild dilatation of the ascending aorta, measuring 42 mm. Venous: The inferior vena cava is dilated in size with greater than 50% respiratory variability, suggesting right atrial pressure of 8 mmHg. IAS/Shunts: The interatrial septum was not well visualized. Additional Comments: There is a large pleural effusion.  LEFT VENTRICLE PLAX 2D LVIDd:         3.70 cm     Diastology LVIDs:         2.60 cm     LV e'  medial:    3.26 cm/s LV PW:         2.10 cm     LV E/e' medial:  33.3 LV IVS:        1.20 cm     LV e' lateral:   4.90 cm/s LVOT diam:     2.10 cm     LV E/e' lateral: 22.2 LV SV:         59 LV SV Index:   35 LVOT Area:     3.46 cm  LV Volumes (MOD) LV vol d, MOD A2C: 64.2 ml LV vol d, MOD A4C: 48.8 ml LV vol s, MOD A2C: 12.4 ml LV vol s, MOD A4C: 19.1 ml LV SV MOD A2C:     51.8 ml LV SV MOD A4C:     48.8 ml LV SV MOD BP:      40.8 ml RIGHT VENTRICLE            IVC RV S prime:     5.11 cm/s  IVC diam: 2.60 cm TAPSE (M-mode): 1.2 cm LEFT ATRIUM             Index        RIGHT ATRIUM           Index LA diam:        3.00 cm 1.78 cm/m   RA Area:     18.30 cm LA Vol (A2C):   59.0 ml 34.98 ml/m  RA Volume:   55.30 ml  32.78 ml/m LA Vol (A4C):   37.4 ml 22.17 ml/m LA Biplane Vol: 47.3 ml 28.04 ml/m  AORTIC VALVE                     PULMONIC VALVE AV Area (Vmax):    1.24 cm      PR End Diast Vel: 1.70 msec AV Area (Vmean):   1.20 cm AV Area (VTI):     1.32 cm AV Vmax:           245.67 cm/s AV Vmean:          169.667 cm/s AV VTI:            0.443 m AV Peak Grad:      24.1 mmHg AV Mean Grad:      13.0 mmHg LVOT Vmax:         87.90 cm/s LVOT Vmean:        58.900 cm/s LVOT VTI:          0.169 m LVOT/AV VTI ratio: 0.38  AORTA Ao Root diam: 3.20 cm Ao Asc diam:  4.07 cm MITRAL VALVE  TRICUSPID VALVE MV Area (PHT): 2.24 cm     TR Peak grad:   53.9 mmHg MV Area VTI:   0.80 cm     TR Vmax:        367.00 cm/s MV Peak grad:  22.5 mmHg MV Mean grad:  13.0 mmHg    SHUNTS MV Vmax:       2.37 m/s     Systemic VTI:  0.17 m MV Vmean:      168.0 cm/s   Systemic Diam: 2.10 cm MV Decel Time: 338 msec MV E velocity: 108.56 cm/s MV A velocity: 220.00 cm/s MV E/A ratio:  0.49 [name] Electronically signed by [name] Signature Date/Time: 04/04/2024/12:26:32 PM    Final    DG ESOPHAGUS W SINGLE CM (SOL OR THIN BA) Result Date: 04/03/2024 CLINICAL DATA:  Dysphagia. Recurrent phlegm in throat, which patient  is unable to swallow. EXAM: ESOPHAGUS/BARIUM SWALLOW STUDY TECHNIQUE: Single contrast esophagram was performed using thin liquid barium. Exam was technically limited by patient's frail physical condition, as she was unable to stand erect, lay supine, or roll on fluoroscopy table. Exam was limited to LPO and RPO projections in semi-erect position. FLUOROSCOPY: Radiation Exposure Index (as provided by the fluoroscopic device): 33.1 mGy Kerma COMPARISON:  None Available. FINDINGS: No evidence of esophageal obstruction, constricting mass, or stricture. Elevated left hemidiaphragm noted. No hiatal hernia seen. No gastroesophageal reflux demonstrated on limited exam. IMPRESSION: Technically limited exam. No evidence of esophageal stricture or hiatal hernia. Electronically Signed   By: Norleen DELENA Kil [name]   On: 04/03/2024 15:52   DG Chest 2 View Result Date: 04/02/2024 CLINICAL DATA:  recent PNA and CHF, worsening oxygen requirement, cough, SOB, wheeze, ronchi and large rales, peripheral edema. EXAM: CHEST - 2 VIEW COMPARISON:  03/21/2024. FINDINGS: Re-demonstration of left retrocardiac airspace opacity obscuring the left hemidiaphragm, descending thoracic aorta and blunting the left lateral costophrenic angle, suggesting combination of left lung atelectasis and/or consolidation with pleural effusion. No significant interval change. There are new heterogeneous opacities overlying the right mid lower lung zones without volume loss, compatible with pneumonia. There is trace right pleural effusion, grossly unchanged since the prior study. Redemonstration of right apical pleural cap, unchanged. Redemonstration of chronic interstitial predominance, similar to the prior study. No frank pulmonary edema. Stable cardio-mediastinal silhouette. There is a left sided 2-lead pacemaker. There are surgical staples along the heart border and sternotomy wires, status post CABG (coronary artery bypass graft). Prosthetic aortic and mitral  valves noted. No acute osseous abnormalities. The soft tissues are within normal limits. IMPRESSION: 1. New heterogeneous opacities overlying the right mid lower lung zones, compatible with pneumonia. 2. Stable left retrocardiac airspace opacity, suggesting combination of left lung atelectasis and/or consolidation with pleural effusion. 3. Stable trace right pleural effusion. Electronically Signed   By: Ree Molt [name]   On: 04/02/2024 12:45   CT Head Wo Contrast Result Date: 04/02/2024 CLINICAL DATA:  Head trauma, minor (Age >= 65y) Head trauma, moderate-severe fall on thinners, sleepy EXAM: CT HEAD WITHOUT CONTRAST TECHNIQUE: Contiguous axial images were obtained from the base of the skull through the vertex without intravenous contrast. RADIATION DOSE REDUCTION: This exam was performed according to the departmental dose-optimization program which includes automated exposure control, adjustment of the mA and/or kV according to patient size and/or use of iterative reconstruction technique. COMPARISON:  CT of the head dated October 09, 2022. FINDINGS: Brain: Chronic lacunar infarct within the left caudate nucleus. Mild periventricular white matter disease. No  evidence of hemorrhage, mass, acute cortical infarct or hydrocephalus. Vascular: Moderate calcific atheromatous disease within the carotid siphons and vertebral arteries. Skull: Intact and unremarkable. Sinuses/Orbits: Status post bilateral lens replacement. Paranasal sinuses are clear. Other: Negative. IMPRESSION: 1. No evidence of acute traumatic injury. 2. Stable chronic ischemic small vessel disease. Electronically Signed   By: Evalene Coho [name]   On: 04/02/2024 12:25   DG Chest 2 View Result Date: 03/21/2024 CLINICAL DATA:  Hypoxia and new cough EXAM: CHEST - 2 VIEW COMPARISON:  Chest radiograph dated 03/18/2024 FINDINGS: Lines/tubes: Left chest wall pacemaker leads project over the right atrium and ventricle. Lungs: Bilateral lung apices  are obscured by the overlying mandible. Low lung volumes with bronchovascular crowding. Bibasilar patchy opacities. Pleura: Increased small bilateral pleural effusions. No pneumothorax. Heart/mediastinum: Similar cardiomediastinal silhouette status post aortic and mitral valve replacement. Bones: Median sternotomy wires are nondisplaced. IMPRESSION: Increased small bilateral pleural effusions with bibasilar patchy opacities, which may represent atelectasis, aspiration, or pneumonia. Electronically Signed   By: Limin  Xu [name]   On: 03/21/2024 10:42   CT Angio Chest PE W and/or Wo Contrast Result Date: 03/19/2024 CLINICAL DATA:  High probability of pulmonary embolism. Symptoms of shortness of breath cough. Evaluation for pneumonia chronic lung disease. History of COPD and CHF. Worsening shortness of breath lately. EXAM: CT ANGIOGRAPHY CHEST WITH CONTRAST TECHNIQUE: Multidetector CT imaging of the chest was performed using the standard protocol during bolus administration of intravenous contrast. Multiplanar CT image reconstructions and MIPs were obtained to evaluate the vascular anatomy. RADIATION DOSE REDUCTION: This exam was performed according to the departmental dose-optimization program which includes automated exposure control, adjustment of the mA and/or kV according to patient size and/or use of iterative reconstruction technique. CONTRAST:  75mL OMNIPAQUE  IOHEXOL  350 MG/ML SOLN COMPARISON:  Chest radiograph 03/18/2024 and CT 05/29/2023 FINDINGS: Cardiovascular: Dilated main pulmonary artery measuring 39 mm. Negative for acute pulmonary embolism. Cardiomegaly. No pericardial effusion. Coronary artery and aortic atherosclerotic calcification. Aortic valve replacement. 42 mm ascending aortic aneurysm. 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 Mediastinum/Nodes: Trachea and esophagus are unremarkable. No thoracic adenopathy. Lungs/Pleura: Right apical  pleural-parenchymal scarring. Patchy ground-glass opacities in the bilateral lower lungs. Or confluent subpleural and peribronchovascular consolidative opacities in the lower lobes and lingula. Small left greater than right pleural effusions. No pneumothorax. Diffuse bronchial wall thickening and mucous plugging in the lower lobes. Scattered centrilobular micro nodules. Upper Abdomen: No acute abnormality. Musculoskeletal: Sternotomy.  No acute fracture. Review of the MIP images confirms the above findings. IMPRESSION: 1. Negative for acute pulmonary embolism. 2. Patchy ground-glass opacities in the bilateral lower lungs with more confluent subpleural and peribronchovascular consolidative opacities in the lower lobes and lingula. Findings are concerning for bronchopneumonia and/or aspiration. A component pulmonary edema may be present as well. 3. Small left greater than right pleural effusions. 4. Dilated main pulmonary artery measuring 39 mm, can be seen with pulmonary hypertension. 5. 42 mm ascending aortic aneurysm. Recommend annual imaging followup by CTA or MRA. This recommendation follows 2010 ACCF/AHA/AATS/ACR/ASA/SCA/SCAI/SIR/STS/SVM Guidelines for the Diagnosis and Management of Patients with Thoracic Aortic Disease. Circulation. 2010; 121: Z733-z630. Aortic aneurysm NOS (ICD10-I71.9) 6. Aortic Atherosclerosis (ICD10-I70.0). Electronically Signed   By: Norman Gatlin [name]   On: 03/19/2024 01:11   DG Chest 2 View Result Date: 03/18/2024 CLINICAL DATA:  sob and cough EXAM: CHEST - 2 VIEW COMPARISON:  September 04, 2023 FINDINGS: Left chest pacemaker with leads terminating in the right atrium right ventricle.  Sternotomy wires. Biapical pleural thickening. Patchy airspace opacities in the right suprahilar region and both lung bases on the frontal radiograph. Small bilateral pleural effusions. No pneumothorax. Aortic and mitral valve replacement. Tortuous aorta with aortic atherosclerosis. No acute fracture or  destructive lesions. Multilevel degenerative disc disease of the spine. IMPRESSION: 1. Patchy airspace opacities in the right suprahilar and both lung bases on the frontal radiograph. While this may represent changes from underlying chronic infection, a superimposed bronchopneumonia would be difficult to exclude. 2. Unchanged small bilateral pleural effusions. Electronically Signed   By: Rogelia Myers [name]   On: 03/18/2024 17:43     Discharge Exam: Vitals:   04/10/24 0520 04/10/24 0802  BP: (!) 145/83   Pulse: 78   Resp: 20   Temp: (!) 97.4 F (36.3 C)   SpO2: 94% 92%   Vitals:   04/09/24 1223 04/09/24 2032 04/10/24 0520 04/10/24 0802  BP: 116/69 110/66 (!) 145/83   Pulse: 82 71 78   Resp: 14 20 20    Temp: 98.3 F (36.8 C) 97.9 F (36.6 C) (!) 97.4 F (36.3 C)   TempSrc: Oral     SpO2: 91% 92% 94% 92%  Weight:      Height:        General: Pt is alert, awake, not in acute distress Cardiovascular: RRR, S1/S2 +, no rubs, no gallops Respiratory: CTA bilaterally, no wheezing, no rhonchi Abdominal: Soft, NT, ND, bowel sounds + Extremities: no edema, no cyanosis, dressing in the right upper extremity.    The results of significant diagnostics from this hospitalization (including imaging, microbiology, ancillary and laboratory) are listed below for reference.     Microbiology: Recent Results (from the past 240 hours)  Urine Culture     Status: None   Collection Time: 04/02/24  2:53 PM   Specimen: Urine, Random  Result Value Ref Range Status   Specimen Description   Final    URINE, RANDOM Performed at [facility], [address] [place], [place] [address]    Special Requests   Final    NONE Reflexed from [phone] [id number] Performed at [facility], [address] [place], [place] [address]    Culture   Final    NO GROWTH Performed at [facility], [address] [place], [place] [address]    Report Status 04/03/2024 FINAL  Final   MRSA Next Gen by PCR, Nasal     Status: None   Collection Time: 04/02/24  6:10 PM   Specimen: Nasal Mucosa; Nasal Swab  Result Value Ref Range Status   MRSA by PCR Next Gen NOT DETECTED NOT DETECTED Final    Comment: (NOTE) The GeneXpert MRSA Assay (FDA approved for NASAL specimens only), is one component of a comprehensive MRSA colonization surveillance program. It is not intended to diagnose MRSA infection nor to guide or monitor treatment for MRSA infections. Test performance is not FDA approved in patients less than 22 years old. Performed at [facility], [address] [place], [place] [address]   Culture, blood (Routine X 2) w Reflex to ID Panel     Status: None   Collection Time: 04/02/24  6:33 PM   Specimen: BLOOD RIGHT ARM  Result Value Ref Range Status   Specimen Description   Final    BLOOD RIGHT ARM Performed at [facility], [address], [place], [place] [address]    Special Requests   Final    BOTTLES  DRAWN AEROBIC ONLY Blood Culture adequate volume Performed at [facility], [address] [place], [place] [address]    Culture   Final    NO GROWTH 5 DAYS Performed at [facility], [address], [place], [place] [address]    Report Status 04/07/2024 FINAL  Final  Culture, blood (Routine X 2) w Reflex to ID Panel     Status: None   Collection Time: 04/02/24  6:34 PM   Specimen: BLOOD RIGHT ARM  Result Value Ref Range Status   Specimen Description   Final    BLOOD RIGHT ARM Performed at [facility], [address], [place], [place] [address]    Special Requests   Final    BOTTLES DRAWN AEROBIC ONLY Blood Culture adequate volume Performed at [facility], [address] [place], [place] [address]    Culture   Final    NO GROWTH 5 DAYS Performed at [facility], [address] [place], [place] [address]    Report Status 04/07/2024 FINAL  Final  Culture, Fungus  without Smear     Status: None (Preliminary result)   Collection Time: 04/06/24 11:21 AM   Specimen: PATH Cytology Pleural fluid  Result Value Ref Range Status   Specimen Description   Final    PERITONEAL Performed at [facility], [address] [place], [place] [address]    Special Requests   Final    NONE Performed at [facility], [address], [place], [place] [address]    Culture   Final    NO FUNGUS ISOLATED AFTER 18 DAYS Performed at [facility], [address], [place], [place] [address]    Report Status PENDING  Incomplete  Acid Fast Smear (AFB)     Status: None   Collection Time: 04/06/24 11:21 AM   Specimen: PATH Cytology Pleural fluid  Result Value Ref Range Status   AFB Specimen Processing Direct Inoculation  Final   Acid Fast Smear Negative  Final    Comment: (NOTE) Performed At: [facility] [address] [place], [place] [id number] Jennette Shorter MD Ey:[id number]    Source (AFB) [phone]  Final    Comment: Performed at [facility], [address] [place], [place] [address]  Body fluid culture w Gram Stain     Status: None (Preliminary result)   Collection Time: 04/06/24 11:21 AM   Specimen: PATH Cytology Pleural fluid  Result Value Ref Range Status   Specimen Description   Final    PERITONEAL Performed at [facility], [address] [place], [place] [address]    Special Requests   Final    NONE Performed at [facility], [address] [place], [place] [address]    Gram Stain   Final    RARE WBC PRESENT, PREDOMINANTLY PMN NO ORGANISMS SEEN    Culture   Final    NO GROWTH 3 DAYS Performed at [facility], [address] [place], [place] [address]    Report Status PENDING  Incomplete     Labs: BNP (last 3 results) Recent Labs    08/27/23 1052 03/18/24 1615 04/02/24 1206  BNP 293.9* 513.9* 365.5*   Basic Metabolic  Panel: Recent Labs  Lab 04/06/24 0939 04/07/24 0401 04/08/24 0037 04/09/24 0419 04/10/24 0423  NA 135 135 133* 133* 135  K 4.5 4.1 3.8 3.6 3.5  CL 93*  93* 95* 92* 95*  CO2 32 33* 32 32 31  GLUCOSE 94 91 133* 88 90  BUN 42* 39* 39* 33* 28*  CREATININE 0.88 0.76 0.88 0.84 0.76  CALCIUM  9.8 9.4 8.8* 8.9 8.9  MG  --   --  2.0  --   --    Liver Function Tests: Recent Labs  Lab 04/07/24 0401 04/08/24 0037  AST 19 18  ALT 13 13  ALKPHOS 55 53  BILITOT 0.5 0.7  PROT 6.0* 5.7*  ALBUMIN  2.4* 2.3*   No results for input(s): LIPASE, AMYLASE in the last 168 hours. No results for input(s): AMMONIA in the last 168 hours. CBC: Recent Labs  Lab 04/06/24 0939 04/07/24 0401 04/08/24 0037 04/09/24 0419 04/10/24 0423  WBC 12.1* 8.5 8.6 8.4 8.2  HGB 10.8* 9.7* 9.7* 10.6* 10.3*  HCT 31.9* 30.0* 30.1* 29.9* 29.7*  MCV 107.4* 106.4* 106.4* 106.8* 104.2*  PLT 69* 56* 54* 65* 74*   Cardiac Enzymes: No results for input(s): CKTOTAL, CKMB, CKMBINDEX, TROPONINI in the last 168 hours. BNP: Invalid input(s): POCBNP CBG: No results for input(s): GLUCAP in the last 168 hours. D-Dimer No results for input(s): DDIMER in the last 72 hours. Hgb A1c No results for input(s): HGBA1C in the last 72 hours. Lipid Profile No results for input(s): CHOL, HDL, LDLCALC, TRIG, CHOLHDL, LDLDIRECT in the last 72 hours. Thyroid  function studies No results for input(s): TSH, T4TOTAL, T3FREE, THYROIDAB in the last 72 hours.  Invalid input(s): FREET3 Anemia work up No results for input(s): VITAMINB12, FOLATE, FERRITIN, TIBC, IRON, RETICCTPCT in the last 72 hours. Urinalysis    Component Value Date/Time   COLORURINE YELLOW 04/02/2024 1453   APPEARANCEUR HAZY (A) 04/02/2024 1453   LABSPEC 1.021 04/02/2024 1453   PHURINE 5.0 04/02/2024 1453   GLUCOSEU NEGATIVE 04/02/2024 1453   GLUCOSEU NEGATIVE 06/30/2023 0832   HGBUR NEGATIVE 04/02/2024 1453    BILIRUBINUR NEGATIVE 04/02/2024 1453   BILIRUBINUR negative 06/16/2021 1012   KETONESUR 5 (A) 04/02/2024 1453   PROTEINUR 30 (A) 04/02/2024 1453   UROBILINOGEN 0.2 06/30/2023 0832   NITRITE NEGATIVE 04/02/2024 1453   LEUKOCYTESUR LARGE (A) 04/02/2024 1453   Sepsis Labs Recent Labs  Lab 04/07/24 0401 04/08/24 0037 04/09/24 0419 04/10/24 0423  WBC 8.5 8.6 8.4 8.2   Microbiology Recent Results (from the past 240 hours)  Urine Culture     Status: None   Collection Time: 04/02/24  2:53 PM   Specimen: Urine, Random  Result Value Ref Range Status   Specimen Description   Final    URINE, RANDOM Performed at [facility], [address], [place], [place] [address]    Special Requests   Final    NONE Reflexed from [phone] [id number] Performed at [facility], [address], [place], [place] [address]    Culture   Final    NO GROWTH Performed at [facility], [address], [place], [place] [address]    Report Status 04/03/2024 FINAL  Final  MRSA Next Gen by PCR, Nasal     Status: None   Collection Time: 04/02/24  6:10 PM   Specimen: Nasal Mucosa; Nasal Swab  Result Value Ref Range Status   MRSA by PCR Next Gen NOT DETECTED NOT DETECTED Final    Comment: (NOTE) The GeneXpert MRSA Assay (FDA approved for NASAL specimens only), is one component of a comprehensive MRSA colonization surveillance program. It is not intended to diagnose MRSA infection nor to guide or  monitor treatment for MRSA infections. Test performance is not FDA approved in patients less than 59 years old. Performed at [facility], [address], [place], [place] [address]   Culture, blood (Routine X 2) w Reflex to ID Panel     Status: None   Collection Time: 04/02/24  6:33 PM   Specimen: BLOOD RIGHT ARM  Result Value Ref Range Status   Specimen Description   Final    BLOOD RIGHT ARM Performed at [facility], [address], [place], [place]  [address]    Special Requests   Final    BOTTLES DRAWN AEROBIC ONLY Blood Culture adequate volume Performed at [facility], [address] [place], [place] [address]    Culture   Final    NO GROWTH 5 DAYS Performed at [facility], [address], [place], [place] [address]    Report Status 04/07/2024 FINAL  Final  Culture, blood (Routine X 2) w Reflex to ID Panel     Status: None   Collection Time: 04/02/24  6:34 PM   Specimen: BLOOD RIGHT ARM  Result Value Ref Range Status   Specimen Description   Final    BLOOD RIGHT ARM Performed at [facility], [address] [place], [place] [address]    Special Requests   Final    BOTTLES DRAWN AEROBIC ONLY Blood Culture adequate volume Performed at [facility], [address], [place], [place] [address]    Culture   Final    NO GROWTH 5 DAYS Performed at [facility], [address], [place], [place] [address]    Report Status 04/07/2024 FINAL  Final  Culture, Fungus without Smear     Status: None (Preliminary result)   Collection Time: 04/06/24 11:21 AM   Specimen: PATH Cytology Pleural fluid  Result Value Ref Range Status   Specimen Description   Final    PERITONEAL Performed at [facility], [address], [place], [place] [address]    Special Requests   Final    NONE Performed at [facility], [address] [place], [place] [address]    Culture   Final    NO FUNGUS ISOLATED AFTER 18 DAYS Performed at [facility], [address], [place], [place] [address]    Report Status PENDING  Incomplete  Acid Fast Smear (AFB)     Status: None   Collection Time: 04/06/24 11:21 AM   Specimen: PATH Cytology Pleural fluid  Result Value Ref Range Status   AFB Specimen Processing Direct Inoculation  Final   Acid Fast Smear Negative  Final    Comment: (NOTE) Performed At: [facility] [address] [place], [place]  [id number] Jennette Shorter MD Ey:[id number]    Source (AFB) [phone]  Final    Comment: Performed at [facility], [address] [place], [place] [address]  Body fluid culture w Gram Stain     Status: None (Preliminary result)   Collection Time: 04/06/24 11:21 AM   Specimen: PATH Cytology Pleural fluid  Result Value Ref Range Status   Specimen Description   Final    PERITONEAL Performed at [facility], [address], [place], [place] [address]    Special Requests   Final    NONE Performed at [facility], [address], [place], [place] [address]    Gram Stain   Final  RARE WBC PRESENT, PREDOMINANTLY PMN NO ORGANISMS SEEN    Culture   Final    NO GROWTH 3 DAYS Performed at [facility], [address], [place], [place] [address]    Report Status PENDING  Incomplete    FURTHER DISCHARGE INSTRUCTIONS:   Get Medicines reviewed and adjusted: Please take all your medications with you for your next visit with your Primary MD   Laboratory/radiological data: Please request your Primary MD to go over all hospital tests and procedure/radiological results at the follow up, please ask your Primary MD to get all Hospital records sent to his/her office.   In some cases, they will be blood work, cultures and biopsy results pending at the time of your discharge. Please request that your primary care [name] goes through all the records of your hospital data and follows up on these results.   Also Note the following: If you experience worsening of your admission symptoms, develop shortness of breath, life threatening emergency, suicidal or homicidal thoughts you must seek medical attention immediately by calling 911 or calling your MD immediately  if symptoms less severe.   You must read complete instructions/literature along with all the possible adverse reactions/side effects for all the Medicines you take and that have been  prescribed to you. Take any new Medicines after you have completely understood and accpet all the possible adverse reactions/side effects.    patient was instructed, not to drive, operate heavy machinery, perform activities at heights, swimming or participation in water  activities or provide baby-sitting services while on Pain, Sleep and Anxiety Medications; until their outpatient Physician has advised to do so again. Also recommended to not to take more than prescribed Pain, Sleep and Anxiety Medications.  It is not advisable to combine anxiety, sleep and pain medications without talking with your primary care provider.     Wear Seat belts while driving.   Please note: You were cared for by a hospitalist during your hospital stay. Once you are discharged, your primary care physician will handle any further medical issues. Please note that NO REFILLS for any discharge medications will be authorized once you are discharged, as it is imperative that you return to your primary care physician (or establish a relationship with a primary care physician if you do not have one) for your post hospital discharge needs so that they can reassess your need for medications and monitor your lab values  Time coordinating discharge: Over 30 minutes  SIGNED:   Fredia Skeeter, MD  [facility] 04/10/2024, 9:02 AM *Please note that this is a verbal dictation therefore any spelling or grammatical errors are due to the Dragon [employer] One system interpretation. If 7PM-7AM, please contact night-coverage [email or web address]

## 2024-04-10 NOTE — Telephone Encounter (Signed)
 Per [name], pt is at [employer] so will not be home for remote checks

## 2024-04-10 NOTE — Telephone Encounter (Signed)
 TL nursing calls to report pain to bottom, rated 9/10. She was admitted from hospital today due to pneumonia. She is receiving scheduled tylenol  1000 mg. She does have a wound to bottom. Orders to give tramadol  50 mg po BID prn x 2 days given. Advised to write SBAR for PCP to see.

## 2024-04-11 ENCOUNTER — Encounter: Payer: Self-pay | Admitting: Nurse Practitioner

## 2024-04-11 ENCOUNTER — Non-Acute Institutional Stay (SKILLED_NURSING_FACILITY): Payer: Self-pay | Admitting: Nurse Practitioner

## 2024-04-11 DIAGNOSIS — G8929 Other chronic pain: Secondary | ICD-10-CM

## 2024-04-11 DIAGNOSIS — F5104 Psychophysiologic insomnia: Secondary | ICD-10-CM | POA: Diagnosis not present

## 2024-04-11 DIAGNOSIS — I509 Heart failure, unspecified: Secondary | ICD-10-CM

## 2024-04-11 DIAGNOSIS — D509 Iron deficiency anemia, unspecified: Secondary | ICD-10-CM

## 2024-04-11 DIAGNOSIS — E440 Moderate protein-calorie malnutrition: Secondary | ICD-10-CM

## 2024-04-11 DIAGNOSIS — M545 Low back pain, unspecified: Secondary | ICD-10-CM

## 2024-04-11 DIAGNOSIS — N3281 Overactive bladder: Secondary | ICD-10-CM | POA: Diagnosis not present

## 2024-04-11 DIAGNOSIS — I4811 Longstanding persistent atrial fibrillation: Secondary | ICD-10-CM

## 2024-04-11 DIAGNOSIS — F325 Major depressive disorder, single episode, in full remission: Secondary | ICD-10-CM

## 2024-04-11 DIAGNOSIS — J441 Chronic obstructive pulmonary disease with (acute) exacerbation: Secondary | ICD-10-CM | POA: Diagnosis not present

## 2024-04-11 DIAGNOSIS — K219 Gastro-esophageal reflux disease without esophagitis: Secondary | ICD-10-CM

## 2024-04-11 DIAGNOSIS — I10 Essential (primary) hypertension: Secondary | ICD-10-CM

## 2024-04-11 LAB — CBC AND DIFFERENTIAL
HCT: 33 — AB (ref 36–46)
Hemoglobin: 10.4 — AB (ref 12.0–16.0)
Neutrophils Absolute: 5668
Platelets: 78 K/uL — AB (ref 150–400)
WBC: 6.7

## 2024-04-11 LAB — COMPREHENSIVE METABOLIC PANEL WITH GFR
Albumin: 2.8 — AB (ref 3.5–5.0)
Calcium: 8.6 — AB (ref 8.7–10.7)
Globulin: 2.7
eGFR: 67

## 2024-04-11 LAB — BASIC METABOLIC PANEL WITH GFR
BUN: 27 — AB (ref 4–21)
CO2: 40 — AB (ref 13–22)
Chloride: 94 — AB (ref 99–108)
Creatinine: 0.9 (ref 0.5–1.1)
Glucose: 57
Potassium: 3.3 meq/L — AB (ref 3.5–5.1)
Sodium: 139 (ref 137–147)

## 2024-04-11 LAB — CBC: RBC: 3.12 — AB (ref 3.87–5.11)

## 2024-04-11 LAB — HEPATIC FUNCTION PANEL
ALT: 21 U/L (ref 7–35)
AST: 29 (ref 13–35)
Alkaline Phosphatase: 115 (ref 25–125)
Bilirubin, Total: 0.5

## 2024-04-11 MED ORDER — TRAMADOL HCL 50 MG PO TABS: 50.0000 mg | ORAL_TABLET | Freq: Two times a day (BID) | ORAL | 0 refills | Status: DC | PRN

## 2024-04-11 NOTE — Progress Notes (Signed)
 Nursing Home Location:    [facility]. [facility]  Place of Service: SNF [phone]  PCP: [name], MD Harlene An, NP  Allergies  Allergen Reactions   Tape Other (See Comments)    THICK TAPES PULLS OFF MY SKIN   Codeine Nausea Only   Prednisone  Nausea Only   Sulfa Antibiotics Nausea Only    Chief Complaint  Patient presents with   New Admit To SNF    Admission.     HPI:  Patient is a 78 y.o. female seen today at   [facility] for Admission.  Pt with hx of a fib, CHF, anxiety, COPD  Went to [facility] on 6/13 due to COPD exacerbation with pneumonia. She completed course of oral antibiotics she went home but symptoms worsen and therefore went back to hospital and was admitted with acute on chronic respiratory failure now s/p left-sided thoracentesis 6/28, fluid appears exudative  but culture negative- completed another 7 days course of antibiotics and solumedrol for 4 days and was weaned to room air.  Cardiology was consulted thought to have acute CHF and lasix  was added  Also had fall at home prior to admission and being followed by nursing for wound care due to skin tears Today she reports she is doing better. No shortness of breath, no cough or congestion. Reports occasional wheezing which is chronic.  No chest pains or palpitations.  She reports she has a bad back and sores on her bottom and the transfer over from hospital to [facility] was very painful.  She was started on tramadol  by oncall provider but she reports she was tramadol  at home and has taken for 3 years routinely due to chronic back pain.  Home prescription was for tramadol  50 mg by mouth twice daily as needed for pain Sometimes she would take both tablets in the morning.    Review of Systems:  Review of Systems  Constitutional:  Negative for activity change, appetite change, fatigue and unexpected weight change.  HENT:  Negative for congestion and hearing loss.   Eyes: Negative.    Respiratory:  Positive for wheezing. Negative for cough and shortness of breath.   Cardiovascular:  Negative for chest pain, palpitations and leg swelling.  Gastrointestinal:  Negative for abdominal pain, constipation and diarrhea.  Genitourinary:  Negative for difficulty urinating and dysuria.  Musculoskeletal:  Positive for arthralgias and back pain. Negative for myalgias.  Skin:  Negative for color change and wound.  Neurological:  Negative for dizziness and weakness.  Psychiatric/Behavioral:  Negative for agitation, behavioral problems and confusion.     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 Known DISH with thoracic spine fusion and moderate kyphosis, h/o L spine fusion L2-5   Adjustment disorder with depressed mood 11-13-2014   62 yo son died MI 14-Oct-2014 Husband died car accident 04/15/15   Anemia    Anticoagulant long-term use 06/14/2019   She takes warfarin for afib/flutter with CHADS2VASc score of 4 (age, sex, CHF, HTN), s/p 2 bioprosthetic valve replacements Coumadin  changed to eliquis  2022 per cardiology   Aortic atherosclerosis 03/03/2022   Noted on 01/2022 chest CT   Arthritis    Arthropathy of spinal facet joint 03/08/2018   Ascending aortic aneurysm 03/02/2021   4.1cm  on CT 01/2021 rec yearly monitoring   Bilateral hip pain 09/08/2016   Cancer (HCC)    conon cancer 2012   CAP (community acquired pneumonia) 02/08/2016   Carotid stenosis 12/08/2020   Carotid US  - 40-59% BICA stenosis (2015) Carotid US  12/2020 - 1-39% BICA stenosis, incidental 3.4cm structure behind L common carotid artery rec further imaging    Cataracts, bilateral    immature   Chest pain 08/25/2014   myoview  low risk 02/2018 (Camitz)   Chronic cholecystitis with calculus  01/21/2015   S/p cholecystectomy    Chronic diastolic heart failure 09/19/2014   Chronic insomnia    Chronic lower back pain 03/05/2008   Returned to Dr Colon - translaminar ESI at L3/4. Myelogram showed significant left sided foraminal stenosis at L2/3 and L3/4 in setting of solid arthrodesis, planned DEXA and if stable osteopenia, considering anterolateral decompression with spacer.    CKD (chronic kidney disease) stage 2, GFR 60-89 ml/min 07/03/2015   Constipation    takes Miralax  daily as needed   COPD (chronic obstructive pulmonary disease)    Albuterol  inhaler daily as needed;Duoneb daily as needed;Spiriva  daily   COVID-19 virus infection 11/11/2022   DDD (degenerative disc disease)    cervical - kyphosis with mod DD changes C5/6 and C6/7; lumbar - early DD at L2/3 (Elsner)   Depression    DISH (diffuse idiopathic skeletal hyperostosis) 05/14/2019   By MRI noted fusion of lower thoracic vertebrae - likely contributing to unsteadiness (Elsner)   Dyspnea on exertion 06/05/2015   myoview  low risk 02/2018   Edema of both lower legs due to peripheral venous insufficiency    Essential hypertension, benign 03/24/2009   hx of not on nmeds currenlty and has been a while since on meds per pt   Heart murmur    Hematuria 06/17/2021   History of kidney stones    History of radiation therapy 05/30/11 to 07/07/11   rectum   History of rectal cancer 05/26/2011   Rectal cancer 2012, midrectum [id number], s/p neoadj chemo&XRT, lap LAR with diverting loop ileostomy 08/2011, ileostomy takedown 03/2012 Discharged from oncology clinic 2017 Marquita)   History of shingles    History of vertebral fracture 11/26/2019   Old L1 vertebral compression fracture incidentally noted on imaging 2020    Hyperlipemia    takes Lovastatin  daily   Lacunar infarction    Left caudate   Left hip pain 07/29/2020   Legally blind in left eye    Lymphangioma 01/07/2021   Noted incidentally on carotid US  12/2020 CT neck -  Likely benign lymphangioma 01/2021   Macrocytosis 03/27/2022   Folate and b12 checked 2023   Malaise and fatigue 01/22/2018   Meralgia paresthetica 12/13/2007   Mixed stress and urge urinary incontinence 03/16/2015   Failed oxybutynin  IR/ER. Vesicare  initially helped but then no longer helpful.  myrbetriq  was not effective.  Saw urology (MacDiarmid) - UDS largely overactive bladder with mild-mod stress incontinence. Improved on abx course - maintained on macrodantin  and toviaz .    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   Pulmonary infiltrates 04/23/2019   06/13/2018-CT super D chest without contrast- generally stable chronic lung disease with pleural parenchymal scarring and scattered nodularity most of these nodules have been tree-in-bud in appearance likely postinfectious or inflammatory the dominant right lower lobe nodule seen on most recent study has resolved no new or enlarging nodules  04/22/2019-CT chest without contrast- waxing and waning per   Pulmonary nodule 02/23/2012    RLL nodule-90mm stable 2006, April 2009, and June 2009 Small pulm nodules Jan 2012 - > Oct 2012 without change   Rheumatic heart disease mitral stenosis    mod MS by echo 02/2014   Right leg swelling 06/11/2020   R venous US  06/2020 WNL: - No evidence of deep vein thrombosis in the lower extremity. No indirect evidence of obstruction proximal to the inguinal ligament. - No cystic structure found in the popliteal fossa.   S/P aortic and mitral valve bioprostheses 12/08/2013   AVR 21 mm, MVR 25 mm-MagnaEase pericardia   S/P AVR (aortic valve replacement) 2015   bioprosthetic (Bartle)   S/P MVR (mitral valve replacement) 2015   bioprosthetic (Bartle)   Sepsis secondary to UTI (HCC) 08/22/2017   Severe aortic valve stenosis 10/01/2013   mild-mod by echo 02/2014   Skin lesion 01/03/2022   Small bowel obstruction, partial 08/2013   reolved without NGT placement.    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   Dr Amy [place]   Syncope 05/28/2018   Thrombocytopenia 11/04/2019   Typical atrial flutter 05/15/2019   Vitamin B12 deficiency 02/28/2015   Start B12 shots 02/2015    Vitamin D  deficiency 10/23/2016   Wounds, multiple    bilateral legs   Past Surgical History:  Procedure Laterality Date   ANTERIOR LAT LUMBAR FUSION Left 07/17/2018   Procedure: Lumbar Two-Three Lumbar Three-Four Anterolateral decompression/interbody fusion with lateral plate fixation/Infuse;  Surgeon: Colon Shove, MD;  Location: [facility] OR;  Service: Neurosurgery;  Laterality: Left;  Lumbar Two-Three Lumbar Three-Four Anterolateral decompression/interbody fusion with lateral plate fixation/Infuse   AORTIC VALVE REPLACEMENT N/A 12/12/2013   Procedure: AORTIC VALVE REPLACEMENT (AVR);  Surgeon: Dorise MARLA Fellers, MD; Service: Open Heart Surgery   BACK SURGERY  2006, 2007   2006 SPACER, 2007 decompression and fusion L4/5   BOWEL RESECTION  04/02/2012   Procedure: SMALL BOWEL RESECTION;  Surgeon: Jina Nephew,  MD;  Location: [facility] ORS;  Service: General;  Laterality: N/A;   BRONCHIAL WASHINGS  03/24/2021   Procedure: BRONCHIAL WASHINGS;  Surgeon: Geronimo Amel, MD;  Location: [facility] ENDOSCOPY;  Service: Endoscopy;;   CARDIOVERSION N/A 08/29/2023   Procedure: CARDIOVERSION (CATH LAB);  Surgeon: Pietro Redell RAMAN, MD;  Location: [facility] INVASIVE CV LAB;  Service: Cardiovascular;  Laterality: N/A;   CARPAL TUNNEL RELEASE Bilateral    CATARACT EXTRACTION W/ INTRAOCULAR LENS IMPLANT Left 10/18/2016   CATARACT EXTRACTION W/ INTRAOCULAR LENS IMPLANT Right 12/13/2016   Dr. Rosan   CHOLECYSTECTOMY N/A 01/21/2015   chronic cholecystitis, Jina Nephew, MD   COLON RESECTION  08/25/2011   Procedure: COLON RESECTION LAPAROSCOPIC;  Surgeon: Jina Nephew, MD;  Location: [facility] ORS;  Service: General;  Laterality: N/A;  Laparoscopic Assisted Low Anterior Resection Diverting Ostomy and onQ pain pump   COLONOSCOPY  03/2013   1 polyp, rpt 3 yrs Marianne)   COLONOSCOPY  05/2021   diverticulosis and patent colon anastomosis with some edema/narrowing, rpt 5 yrs Marianne)   COLONOSCOPY WITH PROPOFOL  N/A 06/09/2016   patent colo-colonic anastomosis, rpt 5 yrs Marianne)   COLONOSCOPY WITH PROPOFOL  N/A 01/25/2024   TA, fair prep (consider repeat if needed), patent rectal anastomosis with congestion edema and stenosis, mild sigmoid diverticulosis Zane Groom, MD)   CYSTOSCOPY/URETEROSCOPY/HOLMIUM LASER/STENT PLACEMENT Right 07/03/2023   Procedure: CYSTOSCOPY RIGHT RETROGRADE PYEPLGRAM RIGHT URETEROSCOPY/HOLMIUM LASER/STENT PLACEMENT;  Surgeon: Carolee Sherwood JONETTA DOUGLAS, MD;  Location: [facility] ORS;  Service: Urology;  Laterality: Right;  1 HR FOR CASE   CYSTOSCOPY/URETEROSCOPY/HOLMIUM LASER/STENT PLACEMENT Right 07/17/2023   Procedure: CYSTOSCOPY/RIGHT URETEROSCOPY/STENT EXCHANGE;  Surgeon: Carolee Sherwood JONETTA DOUGLAS, MD;  Location: [facility] ORS;  Service: Urology;  Laterality: Right;  60 MINS FOR CASE   EP IMPLANTABLE DEVICE N/A 06/16/2016   Procedure:  Pacemaker Implant;  Surgeon: Will Gladis Norton, MD;  Location: [facility] INVASIVE CV LAB;  Service: Cardiovascular;  Laterality: N/A;   ESOPHAGOGASTRODUODENOSCOPY (EGD) WITH PROPOFOL  N/A 01/25/2024   diffuse gastritis benign biopsy Zane, Groom, MD)   FOOT SURGERY  left foot   hammer toe   ILEOSTOMY  08/25/2011   ILEOSTOMY CLOSURE  04/02/2012   Procedure: ILEOSTOMY TAKEDOWN;  Surgeon: Jina Nephew, MD;  Location: [facility] ORS;  Service: General;  Laterality: N/A;   INTRAOPERATIVE TRANSESOPHAGEAL ECHOCARDIOGRAM N/A 12/12/2013   Procedure: INTRAOPERATIVE TRANSESOPHAGEAL ECHOCARDIOGRAM;  Surgeon: Dorise MARLA Fellers, MD;  Location: [facility] OR;  Service: Open Heart Surgery;  Laterality: N/A;   LEFT AND RIGHT HEART CATHETERIZATION WITH CORONARY ANGIOGRAM N/A 11/27/2013   Procedure: LEFT AND RIGHT HEART CATHETERIZATION WITH CORONARY ANGIOGRAM;  Surgeon: [name], MD;  Location: [facility] CATH LAB;  Service: Cardiovascular;  Laterality: N/A;   MITRAL VALVE REPLACEMENT N/A 12/12/2013   Procedure: MITRAL VALVE (MV) REPLACEMENT OR REPAIR;  Surgeon: Dorise MARLA Fellers, MD; Service: Open Heart Surgery   POLYPECTOMY  01/25/2024   Procedure: POLYPECTOMY, INTESTINE;  Surgeon: Charlanne Groom, MD;  Location: [facility] ENDOSCOPY;  Service: Gastroenterology;;   TEE WITHOUT CARDIOVERSION N/A 11/29/2013   Procedure: TRANSESOPHAGEAL ECHOCARDIOGRAM (TEE);  Surgeon: Wilbert JONELLE Bihari, MD;  Location: [facility] ENDOSCOPY;  Service: Cardiovascular;  Laterality: N/A;   TEE WITHOUT CARDIOVERSION N/A 06/04/2019   Procedure: TRANSESOPHAGEAL ECHOCARDIOGRAM (TEE);  Surgeon: Balding Jerel, MD;  Location: [facility] ENDOSCOPY;  Service: Cardiovascular;  Laterality: N/A;   TONSILLECTOMY     TRANSESOPHAGEAL ECHOCARDIOGRAM (CATH LAB) N/A 08/29/2023   Procedure: TRANSESOPHAGEAL ECHOCARDIOGRAM;  Surgeon: Pietro Redell RAMAN, MD;  Location: [facility] INVASIVE CV LAB;  Service: Cardiovascular;  Laterality: N/A;   VIDEO BRONCHOSCOPY N/A 03/24/2021   Procedure: VIDEO BRONCHOSCOPY WITHOUT  FLUORO;  Surgeon: Geronimo Amel, MD;  Location: [facility] ENDOSCOPY;  Service: Endoscopy;  Laterality: N/A;  SMALL SCOPE,PREMEDICATE WITH NEBULIZED LIDOCAINE    Social History:   reports that she quit smoking about 49 years ago. Her smoking use included cigarettes. She started smoking about 59 years ago. She has a 15 pack-year smoking history. She has never used smokeless tobacco. She reports that she does not drink alcohol  and does not use drugs.  Family History  Problem Relation Age of Onset   Heart disease Mother    Diabetes Mother    Hypertension Mother    Stroke Mother  Lung cancer Father    Breast cancer Maternal Aunt 60   Heart attack Maternal Grandfather    CAD Son 74   Heart attack Son 78   Rectal cancer Neg Hx    Stomach cancer Neg Hx    Esophageal cancer Neg Hx    Colon cancer Neg Hx     Medications: Patient's Medications  New Prescriptions   No medications on file  Previous Medications   ACETAMINOPHEN  (TYLENOL ) 500 MG TABLET    Take 2 tablets (1,000 mg total) by mouth in the morning and at bedtime.   ALBUTEROL  (VENTOLIN  HFA) 108 (90 BASE) MCG/ACT INHALER    Inhale 2 puffs into the lungs every 6 (six) hours as needed for wheezing.   ASCORBIC ACID  (VITAMIN C) 500 MG TABLET    Take 1 tablet (500 mg total) by mouth daily.   BIOTIN 1000 MCG TABLET    Take 1,000 mcg by mouth daily.   BUPROPION  (WELLBUTRIN  SR) 150 MG 12 HR TABLET    TAKE 1 TABLET(150 MG) BY MOUTH EVERY MORNING   CHOLECALCIFEROL (VITAMIN D3) 25 MCG (1000 UNIT) TABLET    Take 1,000 Units by mouth daily.   ELIQUIS  5 MG TABS TABLET    TAKE 1 TABLET(5 MG) BY MOUTH TWICE DAILY   FERROUS SULFATE  325 (65 FE) MG EC TABLET    Take 1 tablet (325 mg total) by mouth daily with breakfast.   FLUTICASONE -UMECLIDIN-VILANT (TRELEGY ELLIPTA ) 200-62.5-25 MCG/ACT AEPB    Inhale 1 puff into the lungs daily.   FUROSEMIDE  (LASIX ) 40 MG TABLET    TAKE 1 TABLET BY MOUTH EVERY DAY. MAY TAKE AN EXTRA TABLET AS NEEDED FOR SWELLING    GABAPENTIN  (NEURONTIN ) 100 MG CAPSULE    Take 1 capsule (100 mg total) by mouth 2 (two) times daily as needed (back pain - for daytime use).   GABAPENTIN  (NEURONTIN ) 300 MG CAPSULE    Take 300 mg by mouth at bedtime.   LISINOPRIL  (ZESTRIL ) 5 MG TABLET    Take 5 mg by mouth daily.   LOVASTATIN  (MEVACOR ) 40 MG TABLET    TAKE 1 TABLET(40 MG) BY MOUTH AT BEDTIME   METOPROLOL  SUCCINATE (TOPROL  XL) 25 MG 24 HR TABLET    Take 1 tablet (25 mg total) by mouth every evening.   MONTELUKAST  (SINGULAIR ) 10 MG TABLET    TAKE 1 TABLET(10 MG) BY MOUTH AT BEDTIME   MULTIPLE VITAMINS-MINERALS (PRESERVISION AREDS 2 PO)    Take 2 capsules by mouth in the morning.   OMEPRAZOLE  (PRILOSEC) 20 MG CAPSULE    Take 20 mg by mouth daily.   POTASSIUM CHLORIDE  (KLOR-CON ) 10 MEQ TABLET    TAKE 2 TABLETS(20 MEQ) BY MOUTH DAILY   TRAMADOL  (ULTRAM ) 50 MG TABLET    Take 1 tablet (50 mg total) by mouth 2 (two) times daily as needed for up to 2 days.   TRAZODONE  (DESYREL ) 50 MG TABLET    Take 1 tablet (50 mg total) by mouth at bedtime.   VIBEGRON  (GEMTESA ) 75 MG TABS    Take 75 mg by mouth daily.   VITAMIN B-12 (CYANOCOBALAMIN ) 500 MCG TABLET    Take 500 mcg by mouth 2 (two) times a week.   ZINC  50 MG CAPS    Take 1 capsule (50 mg total) by mouth daily.  Modified Medications   No medications on file  Discontinued Medications   No medications on file     Physical Exam: Vitals:   04/11/24 0837  BP:  108/64  Pulse: 74  Resp: 18  Temp: (!) 97.3 F (36.3 C)  SpO2: 93%  Weight: 141 lb 12.8 oz (64.3 kg)  Height: 5' 4 (1.626 m)    Physical Exam Constitutional:      General: She is not in acute distress.    Appearance: She is well-developed. She is not diaphoretic.  HENT:     Head: Normocephalic and atraumatic.     Mouth/Throat:     Pharynx: No oropharyngeal exudate.  Eyes:     Conjunctiva/sclera: Conjunctivae normal.     Pupils: Pupils are equal, round, and reactive to light.  Cardiovascular:     Rate and Rhythm:  Normal rate and regular rhythm.     Heart sounds: Normal heart sounds.  Pulmonary:     Effort: Pulmonary effort is normal.     Breath sounds: Rhonchi (noted to left lung) present.  Abdominal:     General: Bowel sounds are normal.     Palpations: Abdomen is soft.  Musculoskeletal:     Cervical back: Normal range of motion and neck supple.     Right lower leg: No edema.     Left lower leg: No edema.  Skin:    General: Skin is warm and dry.     Findings: Bruising (to all extermeties) present.  Neurological:     Mental Status: She is alert.  Psychiatric:        Mood and Affect: Mood normal.     Labs reviewed: Basic Metabolic Panel: Recent Labs    06/21/23 0729 06/29/23 0800 08/30/23 0429 09/25/23 1145 04/02/24 1050 04/03/24 0418 04/08/24 0037 04/09/24 0419 04/10/24 0423  NA 140   < > 132*   < > 140   < > 133* 133* 135  K 4.1 Hemolysis seen...   < > 4.4   < > 3.8   < > 3.8 3.6 3.5  CL 101   < > 97*   < > 93*   < > 95* 92* 95*  CO2 34*   < > 28   < > 36*   < > 32 32 31  GLUCOSE 79   < > 120*   < > 114*   < > 133* 88 90  BUN 15   < > 26*   < > 31*   < > 39* 33* 28*  CREATININE 0.88   < > 0.84   < > 1.12*   < > 0.88 0.84 0.76  CALCIUM  9.1   < > 8.9   < > 9.3   < > 8.8* 8.9 8.9  MG  --    < > 2.2  --  2.1  --  2.0  --   --   PHOS 3.7  --   --   --   --   --   --   --   --    < > = values in this interval not displayed.   Liver Function Tests: Recent Labs    04/02/24 1050 04/07/24 0401 04/08/24 0037  AST 17 19 18   ALT 12 13 13   ALKPHOS 74 55 53  BILITOT 0.7 0.5 0.7  PROT 6.9 6.0* 5.7*  ALBUMIN  2.8* 2.4* 2.3*   No results for input(s): LIPASE, AMYLASE in the last 8760 hours. Recent Labs    04/02/24 1206  AMMONIA 19   CBC: Recent Labs    12/04/23 9156 01/05/24 1110 03/18/24 1615 04/02/24 1050 04/03/24 0418 04/08/24 0037 04/09/24 0419 04/10/24 0423  WBC 5.5 8.1   < > 11.5*   < > 8.6 8.4 8.2  NEUTROABS 4.3 6.7  --  10.3*  --   --   --   --   HGB  13.1 13.4   < > 12.3   < > 9.7* 10.6* 10.3*  HCT 39.6 42.5   < > 38.2   < > 30.1* 29.9* 29.7*  MCV 101.5* 104.4*   < > 111.4*   < > 106.4* 106.8* 104.2*  PLT 97.0* 75*   < > 81*   < > 54* 65* 74*   < > = values in this interval not displayed.   TSH: Recent Labs    08/30/23 0429 04/02/24 1050  TSH 0.701 1.395   A1C: Lab Results  Component Value Date   HGBA1C 5.5 12/06/2013   Lipid Panel: Recent Labs    06/21/23 0729  CHOL 152  HDL 67.70  LDLCALC 67  TRIG 85.0  CHOLHDL 2    Radiological Exams: DG ESOPHAGUS W SINGLE CM (SOL OR THIN BA) Result Date: 04/03/2024 CLINICAL DATA:  Dysphagia. Recurrent phlegm in throat, which patient is unable to swallow. EXAM: ESOPHAGUS/BARIUM SWALLOW STUDY TECHNIQUE: Single contrast esophagram was performed using thin liquid barium. Exam was technically limited by patient's frail physical condition, as she was unable to stand erect, lay supine, or roll on fluoroscopy table. Exam was limited to LPO and RPO projections in semi-erect position. FLUOROSCOPY: Radiation Exposure Index (as provided by the fluoroscopic device): 33.1 mGy Kerma COMPARISON:  None Available. FINDINGS: No evidence of esophageal obstruction, constricting mass, or stricture. Elevated left hemidiaphragm noted. No hiatal hernia seen. No gastroesophageal reflux demonstrated on limited exam. IMPRESSION: Technically limited exam. No evidence of esophageal stricture or hiatal hernia. Electronically Signed   By: Norleen DELENA Kil [name]   On: 04/03/2024 15:52   DG Chest 2 View Result Date: 04/02/2024 CLINICAL DATA:  recent PNA and CHF, worsening oxygen requirement, cough, SOB, wheeze, ronchi and large rales, peripheral edema. EXAM: CHEST - 2 VIEW COMPARISON:  03/21/2024. FINDINGS: Re-demonstration of left retrocardiac airspace opacity obscuring the left hemidiaphragm, descending thoracic aorta and blunting the left lateral costophrenic angle, suggesting combination of left lung atelectasis and/or  consolidation with pleural effusion. No significant interval change. There are new heterogeneous opacities overlying the right mid lower lung zones without volume loss, compatible with pneumonia. There is trace right pleural effusion, grossly unchanged since the prior study. Redemonstration of right apical pleural cap, unchanged. Redemonstration of chronic interstitial predominance, similar to the prior study. No frank pulmonary edema. Stable cardio-mediastinal silhouette. There is a left sided 2-lead pacemaker. 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 No acute osseous abnormalities. The soft tissues are within normal limits. IMPRESSION: 1. New heterogeneous opacities overlying the right mid lower lung zones, compatible with pneumonia. 2. Stable left retrocardiac airspace opacity, suggesting combination of left lung atelectasis and/or consolidation with pleural effusion. 3. Stable trace right pleural effusion. Electronically Signed   By: Ree Molt [name]   On: 04/02/2024 12:45   CT Head Wo Contrast Result Date: 04/02/2024 CLINICAL DATA:  Head trauma, minor (Age >= 65y) Head trauma, moderate-severe fall on thinners, sleepy EXAM: CT HEAD WITHOUT CONTRAST TECHNIQUE: Contiguous axial images were obtained from the base of the skull through the vertex without intravenous contrast. RADIATION DOSE REDUCTION: This exam was performed according to the departmental dose-optimization program which includes automated exposure  control, adjustment of the mA and/or kV according to patient size and/or use of iterative reconstruction technique. COMPARISON:  CT of the head dated October 09, 2022. FINDINGS: Brain: Chronic lacunar infarct within the left caudate nucleus. Mild periventricular white matter disease. No evidence of hemorrhage, mass, acute cortical infarct or hydrocephalus. Vascular: Moderate  calcific atheromatous disease within the carotid siphons and vertebral arteries. Skull: Intact and unremarkable. Sinuses/Orbits: Status post bilateral lens replacement. Paranasal sinuses are clear. Other: Negative. IMPRESSION: 1. No evidence of acute traumatic injury. 2. Stable chronic ischemic small vessel disease. Electronically Signed   By: Evalene Coho [name]   On: 04/02/2024 12:25    Assessment/Plan 1. Chronic obstructive pulmonary disease with acute exacerbation (HCC) (Primary) Doing well since hospitalization, continues on Singulair  with trelegy Encouraged to use IS and flutter valve routinely  2. Iron deficiency anemia, unspecified iron deficiency anemia type Continues on supplement, will follow lab.   3. Overactive bladder Controlled on gemtesa   4. Chronic insomnia Stable  at this time  5. Chronic congestive heart failure, unspecified heart failure type (HCC) Euvolemic at this time, continues on metoprolol  and lasix   6. Gastroesophageal reflux disease without esophagitis Controlled on omeprazole .   7. Longstanding persistent atrial fibrillation (HCC) Rate controlled on metoprolol  and continues on eliquis  for anticoagulation   8. Essential hypertension, benign -Blood pressure well controlled, goal bp <140/90 Continue current medications and dietary modifications follow metabolic panel  9. Depression, major, in remission (HCC) Well controlled on wellbutrin    10. Moderate protein-calorie malnutrition (HCC) -encouraged to liberalize diet. To have protein supplement in addition to smallest meal of the day.  12. Chronic midline low back pain without sciatica Ongoing and chronic, will restart tramadol  at this time - traMADol  (ULTRAM ) 50 MG tablet; Take 1 tablet (50 mg total) by mouth 2 (two) times daily as needed.  Dispense: 60 tablet; Refill: 0     Sukhraj Esquivias K. Caro BODILY  [facility] [phone] 8 am - 5 pm) [phone]  (after hours)

## 2024-04-11 NOTE — Telephone Encounter (Signed)
 Remote canceled.

## 2024-04-12 LAB — CULTURE, FUNGUS WITHOUT SMEAR

## 2024-04-15 ENCOUNTER — Ambulatory Visit: Payer: Medicare Other

## 2024-04-19 ENCOUNTER — Non-Acute Institutional Stay (SKILLED_NURSING_FACILITY): Payer: Self-pay | Admitting: Student

## 2024-04-19 ENCOUNTER — Inpatient Hospital Stay: Admitting: Family Medicine

## 2024-04-19 DIAGNOSIS — J441 Chronic obstructive pulmonary disease with (acute) exacerbation: Secondary | ICD-10-CM

## 2024-04-19 DIAGNOSIS — L97929 Non-pressure chronic ulcer of unspecified part of left lower leg with unspecified severity: Secondary | ICD-10-CM

## 2024-04-19 DIAGNOSIS — Z95811 Presence of heart assist device: Secondary | ICD-10-CM

## 2024-04-19 DIAGNOSIS — I4811 Longstanding persistent atrial fibrillation: Secondary | ICD-10-CM

## 2024-04-19 DIAGNOSIS — K219 Gastro-esophageal reflux disease without esophagitis: Secondary | ICD-10-CM

## 2024-04-19 DIAGNOSIS — D509 Iron deficiency anemia, unspecified: Secondary | ICD-10-CM | POA: Diagnosis not present

## 2024-04-19 DIAGNOSIS — N3281 Overactive bladder: Secondary | ICD-10-CM

## 2024-04-19 DIAGNOSIS — M545 Low back pain, unspecified: Secondary | ICD-10-CM

## 2024-04-19 DIAGNOSIS — I83029 Varicose veins of left lower extremity with ulcer of unspecified site: Secondary | ICD-10-CM

## 2024-04-19 DIAGNOSIS — I83019 Varicose veins of right lower extremity with ulcer of unspecified site: Secondary | ICD-10-CM

## 2024-04-19 DIAGNOSIS — F325 Major depressive disorder, single episode, in full remission: Secondary | ICD-10-CM

## 2024-04-19 DIAGNOSIS — F5104 Psychophysiologic insomnia: Secondary | ICD-10-CM

## 2024-04-19 DIAGNOSIS — E440 Moderate protein-calorie malnutrition: Secondary | ICD-10-CM

## 2024-04-19 DIAGNOSIS — N1831 Chronic kidney disease, stage 3a: Secondary | ICD-10-CM

## 2024-04-19 DIAGNOSIS — I509 Heart failure, unspecified: Secondary | ICD-10-CM

## 2024-04-19 DIAGNOSIS — G8929 Other chronic pain: Secondary | ICD-10-CM

## 2024-04-19 DIAGNOSIS — I10 Essential (primary) hypertension: Secondary | ICD-10-CM

## 2024-04-19 DIAGNOSIS — L97919 Non-pressure chronic ulcer of unspecified part of right lower leg with unspecified severity: Secondary | ICD-10-CM

## 2024-04-19 NOTE — Progress Notes (Unsigned)
 Provider:  Abdul Location:  Other Nursing Home Room Number: [facility] Place of Service:  SNF (31)  PCP: [name], MD Patient Care Team: [name], MD as PCP - General (Family Medicine) Francyne Headland, MD as PCP - Cardiology (Cardiology) Teressa Toribio SQUIBB, MD (Inactive) as Consulting Physician (Gastroenterology) Dewey Rush, MD as Consulting Physician (Radiation Oncology) Odogwu, Graylin FERNS, MD as Consulting Physician (Hematology and Oncology) Maye Ade, MD (Inactive) as Consulting Physician (Cardiology) Geronimo Amel, MD as Consulting Physician (Pulmonary Disease) Aron Shoulders, MD (General Surgery) Croitoru, Headland, MD as Consulting Physician (Cardiology) Rosan Credit, MD as Consulting Physician (Ophthalmology) Sw[place]il[place], Amy, MD as Consulting Physician (Dermatology) Skeet Juliene SAUNDERS, DO as Consulting Physician (Neurology)  Extended Emergency Contact Information Primary Emergency Contact: Harmsen,James  [place] Mobile Phone: [phone] Relation: Son Secondary Emergency Contact: [facility] Phone: [phone] Relation: Granddaughter Interpreter needed? No  Code Status: DNR Goals of Care: Advanced Directive information    04/11/2024    8:45 AM  Advanced Directives  Does Patient Have a Medical Advance Directive? Yes  Type of Estate agent of [place];Living will  Does patient want to make changes to medical advance directive? No - Patient declined  Copy of Healthcare Power of Attorney in Chart? Yes - validated most recent copy scanned in chart (See row information)      Chief Complaint  Patient presents with   Admission    HPI: Patient is a 78 y.o. female seen today for admission Discussed the use of AI scribe software for clinical note transcription with the patient, who gave verbal consent to proceed.  History of Present Illness  History of Present Illness The  patient, with heart failure, presents with non-healing wounds and fluid retention.  She has multiple non-healing wounds on her legs and bottom, including a blood blister, a wound from hitting a tray, a scrape from falling on the fireplace, and a wound that started weeping while sitting. The weeping of her legs is a chronic issue. The wounds have been present for six weeks to two months, with one initially being black and large. She has a history of a wound on her bottom and has experienced significant skin tears, including one on her arm after a fall.  She has gained weight recently, which she attributes to fluid retention. She mentions eating from a regular menu but is concerned about the weight gain. She has a history of heart failure and has been on diuretics. She reports a history of fluid around her lungs that required drainage. No chest pain or shortness of breath currently.  She lives alone with her cat, Chrissy, and has a friend who helps care for the cat. She sleeps in a recliner at home and has difficulty getting comfortable in a regular bed. She has experienced falls, including one significant fall that required EMS assistance. She uses a rollator but has had issues with balance and falls.  She has a history of pneumonia and has been hospitalized multiple times this summer. She also has a history of kidney stones and urinary tract infections, for which she takes a low-grade antibiotic. She has an interstim device for overactive bladder, which she feels has not been effective.  She takes multiple medications, including Wellbutrin , gabapentin , tramadol , and trazodone . She is unsure of the effectiveness of some medications, such as Wellbutrin  and Gemtesa  for urinary issues. She experiences back pain and takes tramadol  for it. She has been on Trelegy for years for  respiratory issues.  She has a history of valve replacement surgery and reports no chest pain or shortness of breath currently. She has  experienced memory issues, particularly short-term memory loss, but feels more cognizant now.  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 - The patient has experienced significant personal loss, including the death of her youngest son in 05/25/2015 and her husband six months later. She has a supportive son and granddaughter. The patient has been hospitalized multiple times recently and is currently in a care facility. She is motivated to return home but is dealing with health challenges. She has a history of pneumonia and fluid retention issues. The patient is concerned about her ability to live independently and is considering home health support.   Physical Exam MEASUREMENTS: Weight- 157.2. EXTREMITIES: Feet cold to touch. SKIN: Skin wounds with significant drainage. Significant skin tear on arm. Skin thin and fragile.   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 Known DISH with thoracic spine fusion and moderate kyphosis, h/o L spine fusion L2-5   Adjustment disorder with depressed mood 2014/11/08   22 yo son died MI 10/24/14 Husband died car accident 04/25/15   Anemia    Anticoagulant long-term use 06/14/2019   She takes warfarin for afib/flutter with CHADS2VASc score of 4 (age, sex, CHF, HTN), s/p 2 bioprosthetic valve replacements Coumadin  changed to eliquis  24-May-2021 per cardiology   Aortic atherosclerosis 03/03/2022   Noted on 01/2022 chest CT   Arthritis     Arthropathy of spinal facet joint 03/08/2018   Ascending aortic aneurysm 03/02/2021   4.1cm on CT 01/2021 rec yearly monitoring   Bilateral hip pain 09/08/2016   Cancer (HCC)    conon cancer 05-25-2011   CAP (community acquired pneumonia) 02/08/2016   Carotid stenosis 12/08/2020   Carotid US  - 40-59% BICA stenosis (2015) Carotid US  12/2020 - 1-39% BICA stenosis, incidental 3.4cm structure behind L common carotid artery rec further imaging    Cataracts, bilateral    immature   Chest pain 08/25/2014   myoview  low risk 02/2018 (Camitz)   Chronic cholecystitis with calculus 01/21/2015   S/p cholecystectomy    Chronic diastolic heart failure 09/19/2014   Chronic insomnia    Chronic lower back pain 03/05/2008   Returned to Dr Colon - translaminar ESI at L3/4. Myelogram showed significant left sided foraminal stenosis at L2/3 and L3/4 in setting of solid arthrodesis, planned DEXA and if stable osteopenia, considering anterolateral decompression with spacer.    CKD (chronic kidney disease) stage 2, GFR 60-89 ml/min 07/03/2015   Constipation    takes Miralax  daily as needed   COPD (chronic obstructive pulmonary disease)    Albuterol  inhaler daily as needed;Duoneb daily as needed;Spiriva  daily   COVID-19 virus infection 11/11/2022   DDD (degenerative disc disease)    cervical - kyphosis with mod DD changes C5/6 and C6/7; lumbar - early DD at L2/3 (Elsner)   Depression    DISH (diffuse idiopathic skeletal hyperostosis) 05/14/2019   By MRI noted fusion of lower  thoracic vertebrae - likely contributing to unsteadiness (Elsner)   Dyspnea on exertion 06/05/2015   myoview  low risk 02/2018   Edema of both lower legs due to peripheral venous insufficiency    Essential hypertension, benign 03/24/2009   hx of not on nmeds currenlty and has been a while since on meds per pt   Heart murmur    Hematuria 06/17/2021   History of kidney stones    History of radiation therapy 05/30/11 to 07/07/11   rectum    History of rectal cancer 05/26/2011   Rectal cancer 2012, midrectum ypT3ypN0, s/p neoadj chemo&XRT, lap LAR with diverting loop ileostomy 08/2011, ileostomy takedown 03/2012 Discharged from oncology clinic 2017 Marquita)   History of shingles    History of vertebral fracture 11/26/2019   Old L1 vertebral compression fracture incidentally noted on imaging 2020    Hyperlipemia    takes Lovastatin  daily   Lacunar infarction    Left caudate   Left hip pain 07/29/2020   Legally blind in left eye    Lymphangioma 01/07/2021   Noted incidentally on carotid US  12/2020 CT neck - Likely benign lymphangioma 01/2021   Macrocytosis 03/27/2022   Folate and b12 checked 2023   Malaise and fatigue 01/22/2018   Meralgia paresthetica 12/13/2007   Mixed stress and urge urinary incontinence 03/16/2015   Failed oxybutynin  IR/ER. Vesicare  initially helped but then no longer helpful.  myrbetriq  was not effective.  Saw urology (MacDiarmid) - UDS largely overactive bladder with mild-mod stress incontinence. Improved on abx course - maintained on macrodantin  and toviaz .    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   Pulmonary infiltrates 04/23/2019   06/13/2018-CT super D chest without contrast- generally stable chronic lung disease with pleural parenchymal scarring and scattered nodularity most of these nodules have been tree-in-bud in appearance likely postinfectious or inflammatory the dominant right lower lobe nodule seen on most recent study has resolved no new or enlarging nodules  04/22/2019-CT chest without contrast- waxing and waning per   Pulmonary nodule 02/23/2012   RLL nodule-66mm stable 2006, April 2009, and June 2009 Small pulm nodules Jan 2012 - > Oct 2012 without change   Rheumatic heart disease mitral stenosis    mod MS by echo 02/2014   Right leg swelling 06/11/2020   R venous US  06/2020 WNL: - No evidence of deep vein thrombosis in the lower extremity. No indirect evidence of obstruction proximal to the inguinal ligament. - No cystic structure found in the popliteal fossa.   S/P aortic and mitral valve bioprostheses 12/08/2013   AVR 21 mm, MVR 25 mm-MagnaEase pericardia   S/P  AVR (aortic valve replacement) 2015   bioprosthetic (Bartle)   S/P MVR (mitral valve replacement) 2015   bioprosthetic (Bartle)   Sepsis secondary to UTI (HCC) 08/22/2017   Severe aortic valve stenosis 10/01/2013   mild-mod by echo 02/2014   Skin lesion 01/03/2022   Small bowel obstruction, partial 08/2013   reolved without NGT placement.    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   Dr Amy Swaziland   Syncope 05/28/2018   Thrombocytopenia 11/04/2019   Typical atrial flutter 05/15/2019   Vitamin B12 deficiency 02/28/2015   Start B12 shots 02/2015    Vitamin D  deficiency 10/23/2016   Wounds, multiple    bilateral legs   Past Surgical History:  Procedure Laterality Date   ANTERIOR LAT LUMBAR FUSION Left 07/17/2018   Procedure: Lumbar Two-Three Lumbar Three-Four Anterolateral  decompression/interbody fusion with lateral plate fixation/Infuse;  Surgeon: Colon Shove, MD;  Location: [facility] OR;  Service: Neurosurgery;  Laterality: Left;  Lumbar Two-Three Lumbar Three-Four Anterolateral decompression/interbody fusion with lateral plate fixation/Infuse   AORTIC VALVE REPLACEMENT N/A 12/12/2013   Procedure: AORTIC VALVE REPLACEMENT (AVR);  Surgeon: Dorise MARLA Fellers, MD; Service: Open Heart Surgery   BACK SURGERY  2006, 2007   2006 SPACER, 2007 decompression and fusion L4/5   BOWEL RESECTION  04/02/2012   Procedure: SMALL BOWEL RESECTION;  Surgeon: Jina Nephew, MD;  Location: [facility] ORS;  Service: General;  Laterality: N/A;   BRONCHIAL WASHINGS  03/24/2021   Procedure: BRONCHIAL WASHINGS;  Surgeon: Geronimo Amel, MD;  Location: [facility] ENDOSCOPY;  Service: Endoscopy;;   CARDIOVERSION N/A 08/29/2023   Procedure: CARDIOVERSION (CATH LAB);  Surgeon: Pietro Redell RAMAN, MD;  Location: [facility] INVASIVE CV LAB;  Service: Cardiovascular;  Laterality: N/A;   CARPAL TUNNEL RELEASE Bilateral    CATARACT EXTRACTION W/ INTRAOCULAR LENS IMPLANT Left 10/18/2016   CATARACT EXTRACTION W/ INTRAOCULAR LENS IMPLANT Right 12/13/2016   Dr. Rosan   CHOLECYSTECTOMY N/A 01/21/2015   chronic cholecystitis, Jina Nephew, MD   COLON RESECTION  08/25/2011   Procedure: COLON RESECTION LAPAROSCOPIC;  Surgeon: Jina Nephew, MD;  Location: [facility];  Service: General;  Laterality: N/A;  Laparoscopic Assisted Low Anterior Resection Diverting Ostomy and onQ pain pump   COLONOSCOPY  03/2013   1 polyp, rpt 3 yrs Marianne)   COLONOSCOPY  05/2021   diverticulosis and patent colon anastomosis with some edema/narrowing, rpt 5 yrs Marianne)   COLONOSCOPY WITH PROPOFOL  N/A 06/09/2016   patent colo-colonic anastomosis, rpt 5 yrs Marianne)   COLONOSCOPY WITH PROPOFOL  N/A 01/25/2024   TA, fair prep (consider repeat if needed), patent rectal anastomosis with congestion edema and stenosis, mild sigmoid diverticulosis Zane Groom, MD)   CYSTOSCOPY/URETEROSCOPY/HOLMIUM LASER/STENT PLACEMENT Right 07/03/2023   Procedure: CYSTOSCOPY RIGHT RETROGRADE PYEPLGRAM RIGHT URETEROSCOPY/HOLMIUM LASER/STENT PLACEMENT;  Surgeon: Carolee Sherwood JONETTA DOUGLAS, MD;  Location: [facility];  Service: Urology;  Laterality: Right;  1 HR FOR CASE   CYSTOSCOPY/URETEROSCOPY/HOLMIUM LASER/STENT PLACEMENT Right 07/17/2023   Procedure: CYSTOSCOPY/RIGHT URETEROSCOPY/STENT EXCHANGE;  Surgeon: Carolee Sherwood JONETTA DOUGLAS, MD;  Location: [facility];  Service: Urology;  Laterality: Right;  60 MINS FOR CASE   EP IMPLANTABLE DEVICE N/A 06/16/2016   Procedure: Pacemaker Implant;  Surgeon: Will Gladis Norton, MD;  Location: [facility] INVASIVE CV LAB;  Service: Cardiovascular;  Laterality: N/A;   ESOPHAGOGASTRODUODENOSCOPY (EGD) WITH PROPOFOL  N/A 01/25/2024   diffuse gastritis benign biopsy Zane, Groom, MD)   FOOT SURGERY  left foot   hammer toe   ILEOSTOMY  08/25/2011   ILEOSTOMY  CLOSURE  04/02/2012   Procedure: ILEOSTOMY TAKEDOWN;  Surgeon: Jina Nephew, MD;  Location: [facility];  Service: General;  Laterality: N/A;   INTRAOPERATIVE TRANSESOPHAGEAL ECHOCARDIOGRAM N/A 12/12/2013   Procedure: INTRAOPERATIVE TRANSESOPHAGEAL ECHOCARDIOGRAM;  Surgeon: Dorise MARLA Fellers, MD;  Location: [facility];  Service: Open Heart Surgery;  Laterality: N/A;   LEFT AND RIGHT HEART CATHETERIZATION WITH CORONARY ANGIOGRAM N/A 11/27/2013   Procedure: LEFT AND RIGHT HEART CATHETERIZATION WITH CORONARY ANGIOGRAM;  Surgeon: [name], MD;  Location: [facility] CATH LAB;  Service: Cardiovascular;  Laterality: N/A;   MITRAL VALVE REPLACEMENT N/A 12/12/2013   Procedure: MITRAL VALVE (MV) REPLACEMENT OR REPAIR;  Surgeon: Dorise MARLA Fellers, MD; Service: Open Heart Surgery   POLYPECTOMY  01/25/2024   Procedure: POLYPECTOMY, INTESTINE;  Surgeon: Charlanne Groom, MD;  Location: [facility];  Service: Gastroenterology;;   TEE WITHOUT CARDIOVERSION N/A 11/29/2013   Procedure: TRANSESOPHAGEAL ECHOCARDIOGRAM (TEE);  Surgeon: Wilbert JONELLE Bihari, MD;  Location: [facility] ENDOSCOPY;  Service: Cardiovascular;  Laterality: N/A;   TEE WITHOUT CARDIOVERSION N/A 06/04/2019   Procedure: TRANSESOPHAGEAL ECHOCARDIOGRAM (TEE);  Surgeon: Balding Jerel, MD;  Location: [facility] ENDOSCOPY;  Service: Cardiovascular;  Laterality: N/A;   TONSILLECTOMY     TRANSESOPHAGEAL ECHOCARDIOGRAM (CATH LAB) N/A 08/29/2023   Procedure: TRANSESOPHAGEAL ECHOCARDIOGRAM;  Surgeon: Pietro Redell RAMAN, MD;  Location: [facility] INVASIVE CV LAB;  Service: Cardiovascular;  Laterality: N/A;   VIDEO BRONCHOSCOPY N/A 03/24/2021   Procedure: VIDEO BRONCHOSCOPY WITHOUT FLUORO;  Surgeon: Geronimo Amel, MD;  Location: [facility];  Service: Endoscopy;  Laterality: N/A;  SMALL SCOPE,PREMEDICATE WITH NEBULIZED LIDOCAINE     reports that she quit smoking about 49 years ago. Her smoking use included cigarettes. She started smoking about 59 years ago. She has a 15 pack-year smoking history. She has never used smokeless tobacco. She reports that she does not drink alcohol  and does not use drugs. Social History   Socioeconomic History   Marital status: Widowed    Spouse name: Not on file   Number of children: 2   Years of education: 21   Highest education level: 12th grade  Occupational History   Occupation: Retired  Tobacco Use   Smoking status: Former    Current packs/day: 0.00    Average packs/day: 1.5 packs/day for 10.0 years (15.0 ttl pk-yrs)    Types: Cigarettes    Start date: 12/27/1964    Quit date: 12/28/1974    Years since quitting: 49.3   Smokeless tobacco: Never   Tobacco comments:    quit smoking in 1976  Vaping Use   Vaping status: Never Used  Substance and Sexual Activity   Alcohol  use: Never    Alcohol /week: 2.0 standard drinks of alcohol     Types: 2 Glasses of wine per week   Drug use: No   Sexual activity: Not Currently  Other Topics Concern   Not on file  Social History Narrative   Widow. Husband died 04-06-2015 MVA. 1 cat.    2 sons; 1 grandchild. One son  died of MI 54   Edu: Completed HS, some college   Married 1966   Retired-[employer], volunteers at [employer]   Activity: pulm rehab   Diet: some water , fruits/vegetables daily      Right handed   Caffeine: 4 glasses of tea and 1 diet coke a day   Social Drivers of Corporate investment banker Strain: Low Risk  (11/03/2023)   Overall Financial Resource Strain (CARDIA)    Difficulty of Paying Living Expenses: Not hard at all  Food Insecurity: No Food Insecurity (04/02/2024)   Hunger Vital Sign    Worried About Running Out of Food in the Last Year: Never true    Ran Out of Food in the Last Year: Never true  Transportation Needs: No Transportation Needs (04/02/2024)   PRAPARE - Administrator, Civil Service (Medical): No    Lack of Transportation (Non-Medical): No  Physical Activity: Unknown (11/03/2023)   Exercise Vital Sign    Days of Exercise per Week: 0 days    Minutes of Exercise per Session: Not on file  Stress: No Stress Concern Present (11/03/2023)   [employer] of Occupational Health - Occupational Stress Questionnaire    Feeling of Stress : Only a little  Social Connections: Moderately Integrated (04/02/2024)   Social Connection and Isolation Panel    Frequency of Communication with Friends and Family: More than three times a week    Frequency of Social Gatherings with Friends and Family: Once a week    Attends Religious Services: 1 to 4 times per year    Active Member of [employer] or Organizations: Yes    Attends Banker Meetings: 1 to 4 times per year    Marital Status: Widowed  Intimate Partner Violence: Not At Risk (04/02/2024)   Humiliation, Afraid, Rape, and Kick questionnaire    Fear of Current or Ex-Partner: No    Emotionally Abused: No    Physically Abused: No    Sexually Abused: No    Functional Status Survey:    Family History  Problem Relation Age of Onset   Heart disease Mother    Diabetes Mother    Hypertension Mother    Stroke Mother     Lung cancer Father    Breast cancer Maternal Aunt 45   Heart attack Maternal Grandfather    CAD Son 37   Heart attack Son 7   Rectal cancer Neg Hx    Stomach cancer Neg Hx    Esophageal cancer Neg Hx    Colon cancer Neg Hx     Health Maintenance  Topic Date Due   COVID-19 Vaccine (7 - 2024-25 season) 09/18/2023   MAMMOGRAM  04/13/2024   INFLUENZA VACCINE  05/10/2024   Medicare Annual Wellness (AWV)  06/27/2024   Colonoscopy  01/24/2029   DTaP/Tdap/Td (3 - Td or Tdap) 04/02/2034   Pneumococcal Vaccine: 50+ Years  Completed   DEXA SCAN  Completed   Hepatitis C Screening  Completed   Zoster Vaccines- Shingrix  Completed   Hepatitis B Vaccines  Aged Out   HPV VACCINES  Aged Out   Meningococcal B Vaccine  Aged Out    Allergies  Allergen Reactions   Tape Other (See Comments)    THICK TAPES PULLS OFF MY SKIN   Codeine Nausea Only   Prednisone  Nausea Only   Sulfa Antibiotics Nausea Only    Outpatient Encounter Medications as of 04/19/2024  Medication Sig   acetaminophen  (TYLENOL ) 500 MG tablet Take 2 tablets (1,000 mg total) by mouth in the morning and at bedtime.   albuterol  (VENTOLIN  HFA) 108 (90 Base) MCG/ACT inhaler Inhale 2 puffs into the lungs every 6 (six) hours as needed for wheezing.   ascorbic acid  (VITAMIN C) 500 MG tablet Take 1 tablet (500 mg total) by mouth daily.   Biotin 1000 MCG tablet Take 1,000 mcg by mouth daily.   buPROPion  (WELLBUTRIN  SR) 150 MG 12 hr tablet TAKE 1 TABLET(150 MG) BY MOUTH EVERY MORNING   cholecalciferol (  VITAMIN D3) 25 MCG (1000 UNIT) tablet Take 1,000 Units by mouth daily.   ELIQUIS  5 MG TABS tablet TAKE 1 TABLET(5 MG) BY MOUTH TWICE DAILY   ferrous sulfate  325 (65 FE) MG EC tablet Take 1 tablet (325 mg total) by mouth daily with breakfast.   Fluticasone -Umeclidin-Vilant (TRELEGY ELLIPTA ) 200-62.5-25 MCG/ACT AEPB Inhale 1 puff into the lungs daily.   furosemide  (LASIX ) 40 MG tablet TAKE 1 TABLET BY MOUTH EVERY DAY. MAY TAKE AN  EXTRA TABLET AS NEEDED FOR SWELLING   gabapentin  (NEURONTIN ) 100 MG capsule Take 1 capsule (100 mg total) by mouth 2 (two) times daily as needed (back pain - for daytime use).   gabapentin  (NEURONTIN ) 300 MG capsule Take 300 mg by mouth at bedtime.   lisinopril  (ZESTRIL ) 5 MG tablet Take 5 mg by mouth daily.   lovastatin  (MEVACOR ) 40 MG tablet TAKE 1 TABLET(40 MG) BY MOUTH AT BEDTIME   metoprolol  succinate (TOPROL  XL) 25 MG 24 hr tablet Take 1 tablet (25 mg total) by mouth every evening.   montelukast  (SINGULAIR ) 10 MG tablet TAKE 1 TABLET(10 MG) BY MOUTH AT BEDTIME   Multiple Vitamins-Minerals (PRESERVISION AREDS 2 PO) Take 2 capsules by mouth in the morning.   omeprazole  (PRILOSEC) 20 MG capsule Take 20 mg by mouth daily.   potassium chloride  (KLOR-CON ) 10 MEQ tablet TAKE 2 TABLETS(20 MEQ) BY MOUTH DAILY   traMADol  (ULTRAM ) 50 MG tablet Take 1 tablet (50 mg total) by mouth 2 (two) times daily as needed.   traZODone  (DESYREL ) 50 MG tablet Take 1 tablet (50 mg total) by mouth at bedtime.   Vibegron  (GEMTESA ) 75 MG TABS Take 75 mg by mouth daily.   vitamin B-12 (CYANOCOBALAMIN ) 500 MCG tablet Take 500 mcg by mouth 2 (two) times a week.   Zinc  50 MG CAPS Take 1 capsule (50 mg total) by mouth daily.   No facility-administered encounter medications on file as of 04/19/2024.    Review of Systems  Vitals:   04/21/24 1551  BP: 126/77  Pulse: 77  Resp: 18  Temp: (!) 97.2 F (36.2 C)  SpO2: 92%  Weight: 158 lb (71.7 kg)   Body mass index is 27.12 kg/m. Physical Exam Physical Exam   Labs reviewed: Basic Metabolic Panel: Recent Labs    06/21/23 0729 06/29/23 0800 08/30/23 0429 09/25/23 1145 04/02/24 1050 04/03/24 0418 04/08/24 0037 04/09/24 0419 04/10/24 0423  NA 140   < > 132*   < > 140   < > 133* 133* 135  K 4.1 Hemolysis seen...   < > 4.4   < > 3.8   < > 3.8 3.6 3.5  CL 101   < > 97*   < > 93*   < > 95* 92* 95*  CO2 34*   < > 28   < > 36*   < > 32 32 31  GLUCOSE 79   < >  120*   < > 114*   < > 133* 88 90  BUN 15   < > 26*   < > 31*   < > 39* 33* 28*  CREATININE 0.88   < > 0.84   < > 1.12*   < > 0.88 0.84 0.76  CALCIUM  9.1   < > 8.9   < > 9.3   < > 8.8* 8.9 8.9  MG  --    < > 2.2  --  2.1  --  2.0  --   --   PHOS  3.7  --   --   --   --   --   --   --   --    < > = values in this interval not displayed.   Liver Function Tests: Recent Labs    04/02/24 1050 04/07/24 0401 04/08/24 0037  AST 17 19 18   ALT 12 13 13   ALKPHOS 74 55 53  BILITOT 0.7 0.5 0.7  PROT 6.9 6.0* 5.7*  ALBUMIN  2.8* 2.4* 2.3*   No results for input(s): LIPASE, AMYLASE in the last 8760 hours. Recent Labs    04/02/24 1206  AMMONIA 19   CBC: Recent Labs    12/04/23 0843 01/05/24 1110 03/18/24 1615 04/02/24 1050 04/03/24 0418 04/08/24 0037 04/09/24 0419 04/10/24 0423  WBC 5.5 8.1   < > 11.5*   < > 8.6 8.4 8.2  NEUTROABS 4.3 6.7  --  10.3*  --   --   --   --   HGB 13.1 13.4   < > 12.3   < > 9.7* 10.6* 10.3*  HCT 39.6 42.5   < > 38.2   < > 30.1* 29.9* 29.7*  MCV 101.5* 104.4*   < > 111.4*   < > 106.4* 106.8* 104.2*  PLT 97.0* 75*   < > 81*   < > 54* 65* 74*   < > = values in this interval not displayed.   Cardiac Enzymes: No results for input(s): CKTOTAL, CKMB, CKMBINDEX, TROPONINI in the last 8760 hours. BNP: Invalid input(s): POCBNP Lab Results  Component Value Date   HGBA1C 5.5 12/06/2013   Lab Results  Component Value Date   TSH 1.395 04/02/2024   Lab Results  Component Value Date   VITAMINB12 633 06/21/2023   Lab Results  Component Value Date   FOLATE 10.3 01/03/2022   Lab Results  Component Value Date   IRON 62 06/30/2023   TIBC 341.6 06/30/2023   FERRITIN 75 01/05/2024   Results  RADIOLOGY Pneumonia (June 2025)  DIAGNOSTIC Thoracentesis: Pleural fluid removed (June 2025)  PATHOLOGY Stage 3 pressure ulcer on buttocks  Imaging and Procedures obtained prior to SNF admission: DG ESOPHAGUS W SINGLE CM (SOL OR THIN BA) Result  Date: 04/03/2024 CLINICAL DATA:  Dysphagia. Recurrent phlegm in throat, which patient is unable to swallow. EXAM: ESOPHAGUS/BARIUM SWALLOW STUDY TECHNIQUE: Single contrast esophagram was performed using thin liquid barium. Exam was technically limited by patient's frail physical condition, as she was unable to stand erect, lay supine, or roll on fluoroscopy table. Exam was limited to LPO and RPO projections in semi-erect position. FLUOROSCOPY: Radiation Exposure Index (as provided by the fluoroscopic device): 33.1 mGy Kerma COMPARISON:  None Available. FINDINGS: No evidence of esophageal obstruction, constricting mass, or stricture. Elevated left hemidiaphragm noted. No hiatal hernia seen. No gastroesophageal reflux demonstrated on limited exam. IMPRESSION: Technically limited exam. No evidence of esophageal stricture or hiatal hernia. Electronically Signed   By: Norleen DELENA Kil [name]   On: 04/03/2024 15:52   DG Chest 2 View Result Date: 04/02/2024 CLINICAL DATA:  recent PNA and CHF, worsening oxygen requirement, cough, SOB, wheeze, ronchi and large rales, peripheral edema. EXAM: CHEST - 2 VIEW COMPARISON:  03/21/2024. FINDINGS: Re-demonstration of left retrocardiac airspace opacity obscuring the left hemidiaphragm, descending thoracic aorta and blunting the left lateral costophrenic angle, suggesting combination of left lung atelectasis and/or consolidation with pleural effusion. No significant interval change. There are new heterogeneous opacities overlying the right mid lower lung zones without volume loss, compatible with pneumonia.  There is trace right pleural effusion, grossly unchanged since the prior study. Redemonstration of right apical pleural cap, unchanged. Redemonstration of chronic interstitial predominance, similar to the prior study. No frank pulmonary edema. Stable cardio-mediastinal silhouette. There is a left sided 2-lead pacemaker. 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 No acute osseous abnormalities. The soft tissues are within normal limits. IMPRESSION: 1. New heterogeneous opacities overlying the right mid lower lung zones, compatible with pneumonia. 2. Stable left retrocardiac airspace opacity, suggesting combination of left lung atelectasis and/or consolidation with pleural effusion. 3. Stable trace right pleural effusion. Electronically Signed   By: Ree Molt [name]   On: 04/02/2024 12:45   CT Head Wo Contrast Result Date: 04/02/2024 CLINICAL DATA:  Head trauma, minor (Age >= 65y) Head trauma, moderate-severe fall on thinners, sleepy EXAM: CT HEAD WITHOUT CONTRAST TECHNIQUE: Contiguous axial images were obtained from the base of the skull through the vertex without intravenous contrast. RADIATION DOSE REDUCTION: This exam was performed according to the departmental dose-optimization program which includes automated exposure control, adjustment of the mA and/or kV according to patient size and/or use of iterative reconstruction technique. COMPARISON:  CT of the head dated October 09, 2022. FINDINGS: Brain: Chronic lacunar infarct within the left caudate nucleus. Mild periventricular white matter disease. No evidence of hemorrhage, mass, acute cortical infarct or hydrocephalus. Vascular: Moderate calcific atheromatous disease within the carotid siphons and vertebral arteries. Skull: Intact and unremarkable. Sinuses/Orbits: Status post bilateral lens replacement. Paranasal sinuses are clear. Other: Negative. IMPRESSION: 1. No evidence of acute traumatic injury. 2. Stable chronic ischemic small vessel disease. Electronically Signed   By: Evalene Coho [name]   On: 04/02/2024 12:25    Assessment/Plan Venous stasis ulcers with weeping Chronic venous stasis ulcers with significant weeping and fluid drainage, likely due to underlying heart failure and compromised skin  integrity. Present for 6 weeks to 2 months, with one ulcer previously black and large. Thin skin suggests possible skin failure. - Apply zinc  oxide and calamine to ulcers and cover with non-adherent pad and compression wrap - Consider Unaboot for additional compression  Heart failure with fluid overload Heart failure with significant fluid overload, evidenced by 13-pound weight gain and weeping legs. No chest pain or dyspnea. Previous pleural effusion was drained. Increased diuretic therapy is necessary to manage fluid overload. Emphasized the importance of monitoring kidney function closely due to potential adverse effects of diuretics. 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 Current management includes tramadol  and gabapentin , though effectiveness is uncertain. Discussed adjusting gabapentin  dosing to reduce fall risk. - Continue tramadol  for back pain - Adjust gabapentin  to a higher dose at night and discontinue daytime doses to reduce fall risk  Urinary incontinence Urinary incontinence with an internal bladder device that has not been effective. Currently on Gemtesa , though its effectiveness is uncertain. Experiences urgency and occasional leakage despite medication.  Bowel incontinence Bowel incontinence with difficulty in self-care due to limited mobility. - Plans to obtain a toilet with a bidet for better hygiene management  Pneumonia, resolved Pneumonia earlier this summer, which has resolved. No current respiratory symptoms reported.  Hx of Valve replacement History of valve  replacement with no current issues. Previous mitral valve issues with fluid accumulation noted.  Nephrolithiasis Two remaining asymptomatic kidney  stones.    Family/ staff Communication: nursing  Labs/tests ordered: CBC and BMP  I spent greater than 50  minutes for the care of this patient in face to face time, chart review, clinical documentation, patient education. I spent an additional 16 minutes discussing goals of care and advanced care planning.

## 2024-04-21 ENCOUNTER — Encounter: Payer: Self-pay | Admitting: Student

## 2024-04-24 ENCOUNTER — Encounter: Payer: Self-pay | Admitting: Student

## 2024-04-24 ENCOUNTER — Non-Acute Institutional Stay (SKILLED_NURSING_FACILITY): Payer: Self-pay | Admitting: Student

## 2024-04-24 DIAGNOSIS — I83019 Varicose veins of right lower extremity with ulcer of unspecified site: Secondary | ICD-10-CM | POA: Diagnosis not present

## 2024-04-24 DIAGNOSIS — L97919 Non-pressure chronic ulcer of unspecified part of right lower leg with unspecified severity: Secondary | ICD-10-CM

## 2024-04-24 DIAGNOSIS — E440 Moderate protein-calorie malnutrition: Secondary | ICD-10-CM

## 2024-04-24 DIAGNOSIS — L97929 Non-pressure chronic ulcer of unspecified part of left lower leg with unspecified severity: Secondary | ICD-10-CM

## 2024-04-24 DIAGNOSIS — I83029 Varicose veins of left lower extremity with ulcer of unspecified site: Secondary | ICD-10-CM

## 2024-04-24 DIAGNOSIS — N1831 Chronic kidney disease, stage 3a: Secondary | ICD-10-CM

## 2024-04-24 DIAGNOSIS — I509 Heart failure, unspecified: Secondary | ICD-10-CM

## 2024-04-24 DIAGNOSIS — L89303 Pressure ulcer of unspecified buttock, stage 3: Secondary | ICD-10-CM

## 2024-04-24 NOTE — Telephone Encounter (Signed)
 Plz call and cancel home O2 order -as pt states she has been off oxygen for several weeks.

## 2024-04-24 NOTE — Addendum Note (Signed)
 Addended by: [name] on: 04/24/2024 07:54 AM   Modules accepted: Orders

## 2024-04-24 NOTE — Progress Notes (Signed)
 Location:  Other [place].  Nursing Home Room Number: [facility] 109A Place of Service:  SNF [phone]) Provider:  Dr. Richerd Brigham  PCP: Diane Baller, MD  Patient Care Team: Diane Baller, MD as PCP - General (Family Medicine) Diane Headland, MD as PCP - Cardiology (Cardiology) Diane Toribio SQUIBB, MD (Inactive) as Consulting Physician (Gastroenterology) Diane Rush, MD as Consulting Physician (Radiation Oncology) Diane, Graylin FERNS, MD as Consulting Physician (Hematology and Oncology) Diane Ade, MD (Inactive) as Consulting Physician (Cardiology) Diane Amel, MD as Consulting Physician (Pulmonary Disease) Aron Shoulders, MD (General Surgery) Croitoru, Headland, MD as Consulting Physician (Cardiology) Diane Credit, MD as Consulting Physician (Ophthalmology) [name], Amy, MD as Consulting Physician (Dermatology) Skeet Juliene SAUNDERS, DO as Consulting Physician (Neurology)  Extended Emergency Contact Information Primary Emergency Contact: [name],Diane  [place] Mobile Phone: [phone] Relation: Son Secondary Emergency Contact: [facility] Phone: [phone] Relation: Granddaughter Interpreter needed? No  Code Status:  DNR Goals of care: Advanced Directive information    04/11/2024    8:45 AM  Advanced Directives  Does Patient Have a Medical Advance Directive? Yes  Type of Estate agent of [place];Living will  Does patient want to make changes to medical advance directive? No - Patient declined  Copy of Healthcare Power of Attorney in Chart? Yes - validated most recent copy scanned in chart (See row information)     Chief Complaint  Patient presents with   Congestive Heart Failure    Follow up CHF    HPI:  Pt is a 78 y.o. female seen today for Follow up CHF  History of Present Illness The patient, with congestive heart failure, presents for follow-up care for fluid management and weight  monitoring after a recent pneumonia and CHF exacerbation.  She has a history of congestive heart failure and was recently admitted for pneumonia, which led to an exacerbation of her CHF. Since admission, she gained thirteen pounds. She was started on Lasix  60 mg twice daily, resulting in a one-pound weight loss after three days. Metolazone  was subsequently added to help return her to her baseline weight of 144 pounds.  She feels 'a little stronger' but still experiences difficulty walking due to leg swelling. The swelling has improved, with her legs appearing 'half the size' they were previously, although some swelling has moved up her body. She is being monitored for weight loss.  She is currently on a complex diuretic regimen and is concerned about managing this at home. She was previously on Lasix  before the recent exacerbation and now requires a higher dose. She is accustomed to drinking a lot of fluids.  A social worker has discussed discharge plans with her, but she feels she is not ready to go home yet, as she has not been able to walk independently. She is concerned about her insurance coverage for her stay and is in communication with her primary care doctor regarding her appointments with a hematologist, which she plans to postpone.   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 Known DISH with thoracic spine fusion and moderate kyphosis, h/o L spine fusion L2-5   Adjustment disorder with depressed mood 11-08-2014   3 yo son died MI 10/23/2014 Husband died car accident April 24, 2015   Anemia    Anticoagulant long-term use 06/14/2019  She takes warfarin for afib/flutter with CHADS2VASc score of 4 (age, sex, CHF, HTN), s/p 2 bioprosthetic valve replacements Coumadin  changed  to eliquis  2022 per cardiology   Aortic atherosclerosis 03/03/2022   Noted on 01/2022 chest CT   Arthritis    Arthropathy of spinal facet joint 03/08/2018   Ascending aortic aneurysm 03/02/2021   4.1cm on CT 01/2021 rec yearly monitoring   Bilateral hip pain 09/08/2016   Cancer (HCC)    conon cancer 2012   CAP (community acquired pneumonia) 02/08/2016   Carotid stenosis 12/08/2020   Carotid US  - 40-59% BICA stenosis (2015) Carotid US  12/2020 - 1-39% BICA stenosis, incidental 3.4cm structure behind L common carotid artery rec further imaging    Cataracts, bilateral    immature   Chest pain 08/25/2014   myoview  low risk 02/2018 (Camitz)   Chronic cholecystitis with calculus 01/21/2015   S/p cholecystectomy    Chronic diastolic heart failure 09/19/2014   Chronic insomnia    Chronic lower back pain 03/05/2008   Returned to Dr Colon - translaminar ESI at L3/4. Myelogram showed significant left sided foraminal stenosis at L2/3 and L3/4 in setting of solid arthrodesis, planned DEXA and if stable osteopenia, considering anterolateral decompression with spacer.    CKD (chronic kidney disease) stage 2, GFR 60-89 ml/min 07/03/2015   Constipation    takes Miralax  daily as needed   COPD (chronic obstructive pulmonary disease)    Albuterol  inhaler daily as needed;Duoneb daily as needed;Spiriva  daily   COVID-19 virus infection 11/11/2022   DDD (degenerative disc disease)    cervical - kyphosis with mod DD changes C5/6 and C6/7; lumbar - early DD at L2/3 (Elsner)   Depression    DISH (diffuse idiopathic skeletal hyperostosis) 05/14/2019   By MRI noted fusion of lower thoracic vertebrae - likely contributing to unsteadiness (Elsner)   Dyspnea on exertion 06/05/2015   myoview  low risk 02/2018   Edema of both lower legs due to peripheral venous insufficiency    Essential hypertension, benign 03/24/2009   hx of not on nmeds currenlty and has been a while since on meds per pt   Heart murmur     Hematuria 06/17/2021   History of kidney stones    History of radiation therapy 05/30/11 to 07/07/11   rectum   History of rectal cancer 05/26/2011   Rectal cancer 2012, midrectum [id number], s/p neoadj chemo&XRT, lap LAR with diverting loop ileostomy 08/2011, ileostomy takedown 03/2012 Discharged from oncology clinic 2017 Marquita)   History of shingles    History of vertebral fracture 11/26/2019   Old L1 vertebral compression fracture incidentally noted on imaging 2020    Hyperlipemia    takes Lovastatin  daily   Lacunar infarction    Left caudate   Left hip pain 07/29/2020   Legally blind in left eye    Lymphangioma 01/07/2021   Noted incidentally on carotid US  12/2020 CT neck - Likely benign lymphangioma 01/2021   Macrocytosis 03/27/2022   Folate and b12 checked 2023   Malaise and fatigue 01/22/2018   Meralgia paresthetica 12/13/2007   Mixed stress and urge urinary incontinence 03/16/2015   Failed oxybutynin  IR/ER. Vesicare  initially helped but then no longer helpful.  myrbetriq  was not effective.  Saw urology (MacDiarmid) - UDS largely overactive bladder with mild-mod stress incontinence. Improved on abx course - maintained on macrodantin  and toviaz .    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   Pulmonary infiltrates 04/23/2019   06/13/2018-CT super D chest without contrast- generally stable chronic lung disease with pleural parenchymal scarring and scattered nodularity most of these nodules have been tree-in-bud in appearance likely postinfectious or inflammatory the dominant right lower lobe nodule seen on most recent study has resolved no new or enlarging nodules  04/22/2019-CT chest without contrast- waxing and waning per   Pulmonary nodule 02/23/2012   RLL nodule-88mm stable 2006, April 2009, and June 2009 Small pulm nodules Jan 2012 - > Oct 2012 without change   Rheumatic heart disease mitral stenosis    mod MS by echo 02/2014   Right leg swelling 06/11/2020   R venous US  06/2020 WNL: - No evidence of deep vein thrombosis in the lower extremity. No indirect evidence of obstruction proximal to the inguinal ligament. - No cystic structure found in the popliteal fossa.   S/P aortic and mitral valve bioprostheses 12/08/2013   AVR 21 mm, MVR 25 mm-MagnaEase pericardia   S/P AVR (aortic valve replacement) 2015   bioprosthetic (Bartle)   S/P MVR (mitral valve replacement) 2015   bioprosthetic (Bartle)   Sepsis secondary to UTI (HCC) 08/22/2017   Severe aortic valve stenosis 10/01/2013   mild-mod by echo 02/2014   Skin lesion 01/03/2022   Small bowel obstruction, partial 08/2013   reolved without NGT placement.    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   Dr [name]   Syncope 05/28/2018   Thrombocytopenia 11/04/2019   Typical atrial flutter 05/15/2019   Vitamin B12 deficiency 02/28/2015   Start B12 shots 02/2015    Vitamin D  deficiency 10/23/2016   Wounds, multiple    bilateral legs   Past Surgical History:  Procedure Laterality Date    ANTERIOR LAT LUMBAR FUSION Left 07/17/2018   Procedure: Lumbar Two-Three Lumbar Three-Four Anterolateral decompression/interbody fusion with lateral plate fixation/Infuse;  Surgeon: Colon Shove, MD;  Location: [facility] OR;  Service: Neurosurgery;  Laterality: Left;  Lumbar Two-Three Lumbar Three-Four Anterolateral decompression/interbody fusion with lateral plate fixation/Infuse   AORTIC VALVE REPLACEMENT N/A 12/12/2013   Procedure: AORTIC VALVE REPLACEMENT (AVR);  Surgeon: Dorise MARLA Fellers, MD; Service: Open Heart Surgery   BACK SURGERY  2006, 2007   2006 SPACER, 2007 decompression and fusion L4/5   BOWEL RESECTION  04/02/2012   Procedure: SMALL BOWEL RESECTION;  Surgeon: Jina Nephew, MD;  Location: [facility] ORS;  Service: General;  Laterality: N/A;   BRONCHIAL WASHINGS  03/24/2021   Procedure: BRONCHIAL WASHINGS;  Surgeon: Diane Amel, MD;  Location: [facility] ENDOS[facility];  Service: Endoscopy;;   CARDIOVERSION N/A 08/29/2023   Procedure: CARDIOVERSION (CATH LAB);  Surgeon: Pietro Redell RAMAN, MD;  Location: [facility] INVASIVE CV LAB;  Service:  Cardiovascular;  Laterality: N/A;   CARPAL TUNNEL RELEASE Bilateral    CATARACT EXTRACTION W/ INTRAOCULAR LENS IMPLANT Left 10/18/2016   CATARACT EXTRACTION W/ INTRAOCULAR LENS IMPLANT Right 12/13/2016   Dr. Rosan   CHOLECYSTECTOMY N/A 01/21/2015   chronic cholecystitis, Jina Nephew, MD   COLON RESECTION  08/25/2011   Procedure: COLON RESECTION LAPAROSCOPIC;  Surgeon: Jina Nephew, MD;  Location: [facility];  Service: General;  Laterality: N/A;  Laparoscopic Assisted Low Anterior Resection Diverting Ostomy and onQ pain pump   COLONOSCOPY  03/2013   1 polyp, rpt 3 yrs Marianne)   COLONOSCOPY  05/2021   diverticulosis and patent colon anastomosis with some edema/narrowing, rpt 5 yrs Marianne)   COLONOSCOPY WITH PROPOFOL  N/A 06/09/2016   patent colo-colonic anastomosis, rpt 5 yrs Marianne)   COLONOSCOPY WITH PROPOFOL  N/A 01/25/2024   TA, fair prep (consider repeat if  needed), patent rectal anastomosis with congestion edema and stenosis, mild sigmoid diverticulosis Zane Groom, MD)   CYSTOSCOPY/URETEROSCOPY/HOLMIUM LASER/STENT PLACEMENT Right 07/03/2023   Procedure: CYSTOSCOPY RIGHT RETROGRADE PYEPLGRAM RIGHT URETEROSCOPY/HOLMIUM LASER/STENT PLACEMENT;  Surgeon: Carolee Sherwood JONETTA DOUGLAS, MD;  Location: [facility];  Service: Urology;  Laterality: Right;  1 HR FOR CASE   CYSTOSCOPY/URETEROSCOPY/HOLMIUM LASER/STENT PLACEMENT Right 07/17/2023   Procedure: CYSTOSCOPY/RIGHT URETEROSCOPY/STENT EXCHANGE;  Surgeon: Carolee Sherwood JONETTA DOUGLAS, MD;  Location: [facility];  Service: Urology;  Laterality: Right;  60 MINS FOR CASE   EP IMPLANTABLE DEVICE N/A 06/16/2016   Procedure: Pacemaker Implant;  Surgeon: Will Gladis Norton, MD;  Location: [facility] INVASIVE CV LAB;  Service: Cardiovascular;  Laterality: N/A;   ESOPHAGOGASTRODUODENOSCOPY (EGD) WITH PROPOFOL  N/A 01/25/2024   diffuse gastritis benign biopsy Zane, Groom, MD)   FOOT SURGERY  left foot   hammer toe   ILEOSTOMY  08/25/2011   ILEOSTOMY CLOSURE  04/02/2012   Procedure: ILEOSTOMY TAKEDOWN;  Surgeon: Jina Nephew, MD;  Location: [facility];  Service: General;  Laterality: N/A;   INTRAOPERATIVE TRANSESOPHAGEAL ECHOCARDIOGRAM N/A 12/12/2013   Procedure: INTRAOPERATIVE TRANSESOPHAGEAL ECHOCARDIOGRAM;  Surgeon: Dorise MARLA Fellers, MD;  Location: [facility] OR;  Service: Open Heart Surgery;  Laterality: N/A;   LEFT AND RIGHT HEART CATHETERIZATION WITH CORONARY ANGIOGRAM N/A 11/27/2013   Procedure: LEFT AND RIGHT HEART CATHETERIZATION WITH CORONARY ANGIOGRAM;  Surgeon: [name], MD;  Location: [facility] CATH LAB;  Service: Cardiovascular;  Laterality: N/A;   MITRAL VALVE REPLACEMENT N/A 12/12/2013   Procedure: MITRAL VALVE (MV) REPLACEMENT OR REPAIR;  Surgeon: Dorise MARLA Fellers, MD; Service: Open Heart Surgery   POLYPECTOMY  01/25/2024   Procedure: POLYPECTOMY, INTESTINE;  Surgeon: Charlanne Groom, MD;  Location: [facility];  Service: Gastroenterology;;    TEE WITHOUT CARDIOVERSION N/A 11/29/2013   Procedure: TRANSESOPHAGEAL ECHOCARDIOGRAM (TEE);  Surgeon: Wilbert JONELLE Bihari, MD;  Location[facility] ENDOSCOPY;  Service: Cardiovascular;  Laterality: N/A;   TEE WITHOUT CARDIOVERSION N/A 06/04/2019   Procedure: TRANSESOPHAGEAL ECHOCARDIOGRAM (TEE);  Surgeon: Balding Jerel, MD;  Location: [facility] ENDOSCOPY;  Service: Cardiovascular;  Laterality: N/A;   TONSILLECTOMY     TRANSESOPHAGEAL ECHOCARDIOGRAM (CATH LAB) N/A 08/29/2023   Procedure: TRANSESOPHAGEAL ECHOCARDIOGRAM;  Surgeon: Pietro Redell RAMAN, MD;  Location: [facility] INVASIVE CV LAB;  Service: Cardiovascular;  Laterality: N/A;   VIDEO BRONCHOSCOPY N/A 03/24/2021   Procedure: VIDEO BRONCHOSCOPY WITHOUT FLUORO;  Surgeon: Diane Amel, MD;  Location: [facility];  Service: Endoscopy;  Laterality: N/A;  SMALL SCOPE,PREMEDICATE WITH NEBULIZED LIDOCAINE     Allergies  Allergen Reactions   Tape Other (See Comments)    THICK TAPES PULLS OFF MY SKIN   Codeine  Nausea Only   Prednisone  Nausea Only   Sulfa Antibiotics Nausea Only    Outpatient Encounter Medications as of 04/24/2024  Medication Sig   acetaminophen  (TYLENOL ) 500 MG tablet Take 2 tablets (1,000 mg total) by mouth in the morning and at bedtime.   albuterol  (VENTOLIN  HFA) 108 (90 Base) MCG/ACT inhaler Inhale 2 puffs into the lungs every 6 (six) hours as needed for wheezing.   buPROPion  (WELLBUTRIN  SR) 150 MG 12 hr tablet TAKE 1 TABLET(150 MG) BY MOUTH EVERY MORNING   cholecalciferol (VITAMIN D3) 25 MCG (1000 UNIT) tablet Take 1,000 Units by mouth daily.   ELIQUIS  5 MG TABS tablet TAKE 1 TABLET(5 MG) BY MOUTH TWICE DAILY   ferrous sulfate  325 (65 FE) MG EC tablet Take 1 tablet (325 mg total) by mouth daily with breakfast.   Fluticasone -Umeclidin-Vilant (TRELEGY ELLIPTA ) 200-62.5-25 MCG/ACT AEPB Inhale 1 puff into the lungs daily.   furosemide  (LASIX ) 20 MG tablet Take 20 mg by mouth 2 (two) times daily. Give with 40mg  to equal 60mg .  Give one tablet by  mouth every 24 hours as needed for weight gain of 3lbs in 24hrs or 5lbs in one week give with 40mg  to equal 60mg    furosemide  (LASIX ) 40 MG tablet TAKE 1 TABLET BY MOUTH EVERY DAY. MAY TAKE AN EXTRA TABLET AS NEEDED FOR SWELLING (Patient taking differently: Take 40 mg by mouth 2 (two) times daily. Give along with 20 mg to equal 60mg . Give one tablet by mouth every 24 hours as needed for weight gain of 3lbs in 24 hours or 5lb in one week give along with 20mg  tablet.)   gabapentin  (NEURONTIN ) 100 MG capsule Take 100 mg by mouth 2 (two) times daily.   gabapentin  (NEURONTIN ) 300 MG capsule Take 300 mg by mouth at bedtime.   lisinopril  (ZESTRIL ) 5 MG tablet Take 5 mg by mouth daily.   lovastatin  (MEVACOR ) 40 MG tablet TAKE 1 TABLET(40 MG) BY MOUTH AT BEDTIME   metolazone  (ZAROXOLYN ) 2.5 MG tablet Take 2.5 mg by mouth daily.   metoprolol  succinate (TOPROL  XL) 25 MG 24 hr tablet Take 1 tablet (25 mg total) by mouth every evening.   montelukast  (SINGULAIR ) 10 MG tablet TAKE 1 TABLET(10 MG) BY MOUTH AT BEDTIME   Multiple Vitamins-Minerals (PRESERVISION AREDS 2 PO) Take 2 capsules by mouth in the morning.   omeprazole  (PRILOSEC) 20 MG capsule Take 20 mg by mouth daily.   potassium chloride  (KLOR-CON ) 10 MEQ tablet TAKE 2 TABLETS(20 MEQ) BY MOUTH DAILY   Sodium Hypochlorite (ANASEPT) 0.057 % LIQD Apply topically.   traMADol  (ULTRAM ) 50 MG tablet Take 1 tablet (50 mg total) by mouth 2 (two) times daily as needed.   traZODone  (DESYREL ) 50 MG tablet Take 1 tablet (50 mg total) by mouth at bedtime.   Vibegron  (GEMTESA ) 75 MG TABS Take 75 mg by mouth daily.   vitamin B-12 (CYANOCOBALAMIN ) 500 MCG tablet Take 500 mcg by mouth 2 (two) times a week.   ascorbic acid  (VITAMIN C) 500 MG tablet Take 1 tablet (500 mg total) by mouth daily. (Patient not taking: Reported on 04/24/2024)   Biotin 1000 MCG tablet Take 1,000 mcg by mouth daily. (Patient not taking: Reported on 04/24/2024)   Zinc  50 MG CAPS Take 1 capsule (50 mg  total) by mouth daily. (Patient not taking: Reported on 04/24/2024)   [DISCONTINUED] gabapentin  (NEURONTIN ) 100 MG capsule Take 1 capsule (100 mg total) by mouth 2 (two) times daily as needed (back pain - for daytime use).  No facility-administered encounter medications on file as of 04/24/2024.    Review of Systems  Immunization History  Administered Date(s) Administered   Fluad Quad(high Dose 65+) 07/11/2019, 07/14/2020, 07/25/2022   Fluad Trivalent(High Dose 65+) 08/30/2023   H1N1 09/17/2008   Influenza Split 07/18/2012   Influenza Whole 08/10/2005, 06/24/2009, 07/11/2011   Influenza, High Dose Seasonal PF 07/04/2016, 07/04/2017, 07/27/2021   Influenza,inj,Quad PF,6+ Mos 07/09/2018   Influenza-Unspecified 07/24/2013, 06/30/2014, 06/30/2015   Moderna Covid-19 Vaccine Bivalent Booster 24yrs & up 07/02/2021, 07/24/2023   Moderna SARS-COV2 Booster Vaccination 08/14/2020   Moderna Sars-Covid-2 Vaccination 02/26/2021   PFIZER(Purple Top)SARS-COV-2 Vaccination 10/30/2019, 11/20/2019   Pfizer(Comirnaty)Fall Seasonal Vaccine 12 years and older 08/15/2022   Pneumococcal Conjugate-13 06/02/2014   Pneumococcal Polysaccharide-23 08/10/2005, 04/21/2008, 06/25/2013   Respiratory Syncytial Virus Vaccine,Recomb Aduvanted(Arexvy) 07/13/2022   Rsv, Bivalent, Protein Subunit Rsvpref,pf (Abrysvo) 07/13/2022   Td 10/11/2005   Tdap 04/02/2024   Zoster Recombinant(Shingrix) 06/25/2018, 08/28/2018   Zoster, Live 09/16/2014   Pertinent  Health Maintenance Due  Topic Date Due   MAMMOGRAM  04/13/2024   INFLUENZA VACCINE  05/10/2024   Colonoscopy  01/24/2029   DEXA SCAN  Completed      12/05/2023    1:42 PM 12/06/2023    1:03 PM 02/05/2024    2:24 PM 03/14/2024    9:38 AM 03/25/2024   10:09 AM  Fall Risk  Falls in the past year? 1 1 1 1 1   Was there an injury with Fall? 0 0  0 0  Fall Risk Category Calculator 2 2  2 2   Patient at Risk for Falls Due to History of fall(s)    History of fall(s)  Fall  risk Follow up Falls evaluation completed       Functional Status Survey:    Vitals:   04/24/24 0918  BP: 115/62  Pulse: 89  Temp: (!) 97.1 F (36.2 C)  SpO2: 91%  Weight: 155 lb 12.8 oz (70.7 kg)  Height: 5' 4 (1.626 m)   Body mass index is 26.74 kg/m. Physical Exam Physical Exam EXTREMITIES: Reduced swelling in legs. However 2+pitting edema of the thighs SKIN: Stage three pressure injury if the sacrum. Discoloration around edges of wound, not necrotic.   Labs reviewed: Recent Labs    06/21/23 0729 06/29/23 0800 08/30/23 0429 09/25/23 1145 04/02/24 1050 04/03/24 0418 04/08/24 0037 04/09/24 0419 04/10/24 0423 04/11/24 0000  NA 140   < > 132*   < > 140   < > 133* 133* 135 139  K 4.1 Hemolysis seen...   < > 4.4   < > 3.8   < > 3.8 3.6 3.5 3.3*  CL 101   < > 97*   < > 93*   < > 95* 92* 95* 94*  CO2 34*   < > 28   < > 36*   < > 32 32 31 40*  GLUCOSE 79   < > 120*   < > 114*   < > 133* 88 90  --   BUN 15   < > 26*   < > 31*   < > 39* 33* 28* 27*  CREATININE 0.88   < > 0.84   < > 1.12*   < > 0.88 0.84 0.76 0.9  CALCIUM  9.1   < > 8.9   < > 9.3   < > 8.8* 8.9 8.9 8.6*  MG  --    < > 2.2  --  2.1  --  2.0  --   --   --  PHOS 3.7  --   --   --   --   --   --   --   --   --    < > = values in this interval not displayed.   Recent Labs    04/02/24 1050 04/07/24 0401 04/08/24 0037 04/11/24 0000  AST 17 19 18 29   ALT 12 13 13 21   ALKPHOS 74 55 53 115  BILITOT 0.7 0.5 0.7  --   PROT 6.9 6.0* 5.7*  --   ALBUMIN  2.8* 2.4* 2.3* 2.8*   Recent Labs    01/05/24 1110 03/18/24 1615 04/02/24 1050 04/03/24 0418 04/08/24 0037 04/09/24 0419 04/10/24 0423 04/11/24 0000  WBC 8.1   < > 11.5*   < > 8.6 8.4 8.2 6.7  NEUTROABS 6.7  --  10.3*  --   --   --   --  5,668.00  HGB 13.4   < > 12.3   < > 9.7* 10.6* 10.3* 10.4*  HCT 42.5   < > 38.2   < > 30.1* 29.9* 29.7* 33*  MCV 104.4*   < > 111.4*   < > 106.4* 106.8* 104.2*  --   PLT 75*   < > 81*   < > 54* 65* 74* 78*   < > =  values in this interval not displayed.   Lab Results  Component Value Date   TSH 1.395 04/02/2024   Lab Results  Component Value Date   HGBA1C 5.5 12/06/2013   Lab Results  Component Value Date   CHOL 152 06/21/2023   HDL 67.70 06/21/2023   LDLCALC 67 06/21/2023   LDLDIRECT 111.0 06/26/2015   TRIG 85.0 06/21/2023   CHOLHDL 2 06/21/2023    Significant Diagnostic Results in last 30 days:  US  THORACENTESIS ASP PLEURAL SPACE W/IMG GUIDE Result Date: 04/06/2024 INDICATION: 142230 Pleural effusion 142230 Shortness of breath. Left-sided pleural effusion. Request for diagnostic and therapeutic thoracentesis. EXAM: ULTRASOUND GUIDED LEFT THORACENTESIS MEDICATIONS: 1% plain lidocaine , 4 mL COMPLICATIONS: None immediate. PROCEDURE: An ultrasound guided thoracentesis was thoroughly discussed with the patient and questions answered. The benefits, risks, alternatives and complications were also discussed. The patient understands and wishes to proceed with the procedure. Written consent was obtained. Ultrasound was performed to localize and mark an adequate pocket of fluid in the left chest. The area was then prepped and draped in the normal sterile fashion. 1% Lidocaine  was used for local anesthesia. Under ultrasound guidance a 6 Fr Safe-T-Centesis catheter was introduced. Thoracentesis was performed. The catheter was removed and a dressing applied. FINDINGS: A total of approximately 400 mL of hazy, serosanguineous fluid was removed. Samples were sent to the laboratory as requested by the clinical team. IMPRESSION: Successful ultrasound guided LEFT thoracentesis yielding 400 mL of pleural fluid. Procedure performed by Franky Rusk PA-C and supervised by Dr. Thom Hall Electronically Signed   By: Thom Hall [name]   On: 04/06/2024 15:00   DG Chest Port 1 View Result Date: 04/06/2024 CLINICAL DATA:  [id number] Status post thoracentesis [id number] EXAM: PORTABLE CHEST - 1 VIEW COMPARISON:  04/05/2024 FINDINGS: The  apices are excluded despite repeating the exposure. No visualized pneumothorax. Interval decrease in left pleural effusion. Persistent blunting of the right lateral costophrenic angle. Mild coarse interstitial and alveolar opacities bilaterally as before, with some patchy consolidation/atelectasis in the lung bases, improved on the left since prior study. Heart size and mediastinal contours are within normal limits. Post AVR. Stable left subclavian transvenous pacemaker. Sternotomy wires.  IMPRESSION: 1. No pneumothorax status post left thoracentesis. 2. Persistent right pleural effusion and bibasilar consolidation/atelectasis. Electronically Signed   By: JONETTA Faes [name]   On: 04/06/2024 12:28   DG CHEST PORT 1 VIEW Result Date: 04/05/2024 CLINICAL DATA:  Pleural effusion EXAM: PORTABLE CHEST 1 VIEW COMPARISON:  04/05/2024 FINDINGS: LEFT-sided pacemaker overlies normal cardiac silhouette. Midline sternotomy. Moderate LEFT pleural effusion is noted. Interval improvement in RIGHT lower lobe airspace disease seen on comparison exam IMPRESSION: 1. Persistent but improved RIGHT lobe airspace disease. 2. Persistent LEFT lung effusion. Electronically Signed   By: Jackquline Boxer [name]   On: 04/05/2024 14:37   ECHOCARDIOGRAM COMPLETE Result Date: 04/04/2024    ECHOCARDIOGRAM REPORT   Patient Name:   [name] Date of Exam: 04/04/2024 Medical Rec #:  [id number]            Height:       64.0 in Accession #:    [id number]           Weight:       141.1 lb Date of Birth:  [birth year]            BSA:          1.687 m Patient Age:    33 years             BP:           139/63 mmHg Patient Gender: F                    HR:           70 bpm. Exam Location:  Inpatient Procedure: 2D Echo, Cardiac Doppler and Color Doppler (Both Spectral and Color            Flow Doppler were utilized during procedure). Indications:    R06.02 SOB  History:        Patient has prior history of Echocardiogram examinations, most                  recent 08/29/2023. Abnormal ECG and Pacemaker, COPD, Aortic                 Valve Disease and Mitral Valve Disease, Arrythmias:Atrial                 Fibrillation, Signs/Symptoms:Chest Pain, Shortness of Breath,                 Dyspnea, Edema and Syncope; Risk Factors:Hypertension and                 Dyslipidemia. Cancer. PFO. MVR. AVR.                 Aortic Valve: 21 mm Magna Ease valve is present in the aortic                 position. Procedure Date: 12/08/2013.                 Mitral Valve: 25 mm bioprosthetic valve valve is present in the                 mitral position. Procedure Date: 12/08/2013.  Sonographer:    Ellouise Mose RDCS Referring Phys: [phone] JESSICA U [facility]  Sonographer Comments: Technically difficult study due to poor echo windows. Patient could not lie back. IMPRESSIONS  1. Left ventricular ejection fraction, by estimation, is 65 to 70%. The left ventricle has normal function. The left ventricle has no regional wall  motion abnormalities. There is moderate concentric left ventricular hypertrophy. Left ventricular diastolic parameters are indeterminate.  2. Right ventricular systolic function is moderately reduced. The right ventricular size is mildly enlarged. There is severely elevated pulmonary artery systolic pressure. The estimated right ventricular systolic pressure is 61.9 mmHg.  3. Right atrial size was mildly dilated.  4. The mitral valve has been repaired/replaced. No evidence of mitral valve regurgitation. Severe mitral stenosis. The mean mitral valve gradient is 13.0 mmHg. There is a 25 mm bioprosthetic valve present in the mitral position. Procedure Date: 12/08/2013. Echo findings are consistent with stenosis the mitral prosthesis.  5. Large pleural effusion.  6. Tricuspid valve regurgitation is moderate.  7. The aortic valve has been repaired/replaced. Aortic valve regurgitation is not visualized. Aortic valve sclerosis is present, with no evidence of aortic valve stenosis. There is a 21 mm  Magna Ease valve present in the aortic position. Procedure Date:  12/08/2013.  8. Aortic dilatation noted. There is mild dilatation of the ascending aorta, measuring 42 mm.  9. The inferior vena cava is dilated in size with >50% respiratory variability, suggesting right atrial pressure of 8 mmHg. FINDINGS  Left Ventricle: Left ventricular ejection fraction, by estimation, is 65 to 70%. The left ventricle has normal function. The left ventricle has no regional wall motion abnormalities. The left ventricular internal cavity size was small. There is moderate  concentric left ventricular hypertrophy. Left ventricular diastolic function could not be evaluated due to mitral valve replacement. Left ventricular diastolic parameters are indeterminate. Right Ventricle: The right ventricular size is mildly enlarged. No increase in right ventricular wall thickness. Right ventricular systolic function is moderately reduced. There is severely elevated pulmonary artery systolic pressure. The tricuspid regurgitant velocity is 3.67 m/s, and with an assumed right atrial pressure of 8 mmHg, the estimated right ventricular systolic pressure is 61.9 mmHg. Left Atrium: Left atrial size was not well visualized. Right Atrium: Right atrial size was mildly dilated. Pericardium: There is no evidence of pericardial effusion. Mitral Valve: The mitral valve has been repaired/replaced. No evidence of mitral valve regurgitation. There is a 25 mm bioprosthetic valve present in the mitral position. Procedure Date: 12/08/2013. Echo findings are consistent with stenosis of the mitral prosthesis. Severe mitral valve stenosis. MV peak gradient, 22.5 mmHg. The mean mitral valve gradient is 13.0 mmHg. Tricuspid Valve: The tricuspid valve is normal in structure. Tricuspid valve regurgitation is moderate . No evidence of tricuspid stenosis. Aortic Valve: Acceleration time borderline, likely normal valve function. The aortic valve has been repaired/replaced.  Aortic valve regurgitation is not visualized. Aortic valve sclerosis is present, with no evidence of aortic valve stenosis. Aortic valve mean gradient measures 13.0 mmHg. Aortic valve peak gradient measures 24.1 mmHg. Aortic valve area, by VTI measures 1.32 cm. There is a 21 mm Magna Ease valve present in the aortic position. Procedure Date: 12/08/2013. Pulmonic Valve: The pulmonic valve was normal in structure. Pulmonic valve regurgitation is mild. No evidence of pulmonic stenosis. Aorta: The aortic root is normal in size and structure and aortic dilatation noted. There is mild dilatation of the ascending aorta, measuring 42 mm. Venous: The inferior vena cava is dilated in size with greater than 50% respiratory variability, suggesting right atrial pressure of 8 mmHg. IAS/Shunts: The interatrial septum was not well visualized. Additional Comments: There is a large pleural effusion.  LEFT VENTRICLE PLAX 2D LVIDd:         3.70 cm     Diastology LVIDs:  2.60 cm     LV e' medial:    3.26 cm/s LV PW:         2.10 cm     LV E/e' medial:  33.3 LV IVS:        1.20 cm     LV e' lateral:   4.90 cm/s LVOT diam:     2.10 cm     LV E/e' lateral: 22.2 LV SV:         59 LV SV Index:   35 LVOT Area:     3.46 cm  LV Volumes (MOD) LV vol d, MOD A2C: 64.2 ml LV vol d, MOD A4C: 48.8 ml LV vol s, MOD A2C: 12.4 ml LV vol s, MOD A4C: 19.1 ml LV SV MOD A2C:     51.8 ml LV SV MOD A4C:     48.8 ml LV SV MOD BP:      40.8 ml RIGHT VENTRICLE            IVC RV S prime:     5.11 cm/s  IVC diam: 2.60 cm TAPSE (M-mode): 1.2 cm LEFT ATRIUM             Index        RIGHT ATRIUM           Index LA diam:        3.00 cm 1.78 cm/m   RA Area:     18.30 cm LA Vol (A2C):   59.0 ml 34.98 ml/m  RA Volume:   55.30 ml  32.78 ml/m LA Vol (A4C):   37.4 ml 22.17 ml/m LA Biplane Vol: 47.3 ml 28.04 ml/m  AORTIC VALVE                     PULMONIC VALVE AV Area (Vmax):    1.24 cm      PR End Diast Vel: 1.70 msec AV Area (Vmean):   1.20 cm AV Area  (VTI):     1.32 cm AV Vmax:           245.67 cm/s AV Vmean:          169.667 cm/s AV VTI:            0.443 m AV Peak Grad:      24.1 mmHg AV Mean Grad:      13.0 mmHg LVOT Vmax:         87.90 cm/s LVOT Vmean:        58.900 cm/s LVOT VTI:          0.169 m LVOT/AV VTI ratio: 0.38  AORTA Ao Root diam: 3.20 cm Ao Asc diam:  4.07 cm MITRAL VALVE                TRICUSPID VALVE MV Area (PHT): 2.24 cm     TR Peak grad:   53.9 mmHg MV Area VTI:   0.80 cm     TR Vmax:        367.00 cm/s MV Peak grad:  22.5 mmHg MV Mean grad:  13.0 mmHg    SHUNTS MV Vmax:       2.37 m/s     Systemic VTI:  0.17 m MV Vmean:      168.0 cm/s   Systemic Diam: 2.10 cm MV Decel Time: 338 msec MV E velocity: 108.56 cm/s MV A velocity: 220.00 cm/s MV E/A ratio:  0.49 Morene Brownie Electronically signed by Morene Brownie Signature Date/Time: 04/04/2024/12:26:32 PM  Final    DG ESOPHAGUS W SINGLE CM (SOL OR THIN BA) Result Date: 04/03/2024 CLINICAL DATA:  Dysphagia. Recurrent phlegm in throat, which patient is unable to swallow. EXAM: ESOPHAGUS/BARIUM SWALLOW STUDY TECHNIQUE: Single contrast esophagram was performed using thin liquid barium. Exam was technically limited by patient's frail physical condition, as she was unable to stand erect, lay supine, or roll on fluoroscopy table. Exam was limited to LPO and RPO projections in semi-erect position. FLUOROSCOPY: Radiation Exposure Index (as provided by the fluoroscopic device): 33.1 mGy Kerma COMPARISON:  None Available. FINDINGS: No evidence of esophageal obstruction, constricting mass, or stricture. Elevated left hemidiaphragm noted. No hiatal hernia seen. No gastroesophageal reflux demonstrated on limited exam. IMPRESSION: Technically limited exam. No evidence of esophageal stricture or hiatal hernia. Electronically Signed   By: Norleen DELENA Kil [name]   On: 04/03/2024 15:52   DG Chest 2 View Result Date: 04/02/2024 CLINICAL DATA:  recent PNA and CHF, worsening oxygen requirement, cough, SOB,  wheeze, ronchi and large rales, peripheral edema. EXAM: CHEST - 2 VIEW COMPARISON:  03/21/2024. FINDINGS: Re-demonstration of left retrocardiac airspace opacity obscuring the left hemidiaphragm, descending thoracic aorta and blunting the left lateral costophrenic angle, suggesting combination of left lung atelectasis and/or consolidation with pleural effusion. No significant interval change. There are new heterogeneous opacities overlying the right mid lower lung zones without volume loss, compatible with pneumonia. There is trace right pleural effusion, grossly unchanged since the prior study. Redemonstration of right apical pleural cap, unchanged. Redemonstration of chronic interstitial predominance, similar to the prior study. No frank pulmonary edema. Stable cardio-mediastinal silhouette. There is a left sided 2-lead pacemaker. 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 No acute osseous abnormalities. The soft tissues are within normal limits. IMPRESSION: 1. New heterogeneous opacities overlying the right mid lower lung zones, compatible with pneumonia. 2. Stable left retrocardiac airspace opacity, suggesting combination of left lung atelectasis and/or consolidation with pleural effusion. 3. Stable trace right pleural effusion. Electronically Signed   By: Ree Molt [name]   On: 04/02/2024 12:45   CT Head Wo Contrast Result Date: 04/02/2024 CLINICAL DATA:  Head trauma, minor (Age >= 65y) Head trauma, moderate-severe fall on thinners, sleepy EXAM: CT HEAD WITHOUT CONTRAST TECHNIQUE: Contiguous axial images were obtained from the base of the skull through the vertex without intravenous contrast. RADIATION DOSE REDUCTION: This exam was performed according to the departmental dose-optimization program which includes automated exposure control, adjustment of the mA and/or kV according to  patient size and/or use of iterative reconstruction technique. COMPARISON:  CT of the head dated October 09, 2022. FINDINGS: Brain: Chronic lacunar infarct within the left caudate nucleus. Mild periventricular white matter disease. No evidence of hemorrhage, mass, acute cortical infarct or hydrocephalus. Vascular: Moderate calcific atheromatous disease within the carotid siphons and vertebral arteries. Skull: Intact and unremarkable. Sinuses/Orbits: Status post bilateral lens replacement. Paranasal sinuses are clear. Other: Negative. IMPRESSION: 1. No evidence of acute traumatic injury. 2. Stable chronic ischemic small vessel disease. Electronically Signed   By: Evalene Coho [name]   On: 04/02/2024 12:25    Assessment/Plan Assessment and Plan Congestive Heart Failure (CHF) CHF exacerbation following recent pneumonia admission with a 13-pound weight gain since admission. Currently on a diuretic regimen with Lasix  60 mg BID and metolazone . Improvement in facial and leg edema noted, but some swelling persists. Fluid restriction to 1-1.5 liters per day discussed. Not ready for  discharge due to current health status and therapy requirements. A higher dose of Lasix  may be necessary long term. Referral to a heart failure clinic for ongoing management and potential IV diuretics is planned to help prevent hospital readmission. CHF and generalized edema likely contributing to poor skin integrity and hypo perfusion. Patient is at high risk for decline.  - Continue Lasix  60 mg BID - Continue metolazone  - Implement fluid restriction to 1-1.5 liters per day - Refer to heart failure clinic for ongoing management and potential IV diuretics - Arrange for home health care referral  Pneumonia Recent admission for pneumonia with CHF exacerbation. Currently recovering with improvement in symptoms.  Stage 3 Pressure Ulcer Stage 3 pressure ulcer on the backside with discoloration around the edges and hard areas, but  not necrotic. Appears to be healing but looks worse than at admission. Uses a cushion with a hole for comfort and to reduce pressure on the ulcer. - Continue use of cushion with a hole for pressure relief -protein supplementation and prosource BID  Anemia Anemia with low hemoglobin levels. Followed by a hematologist. Current status allows for postponement of hematology appointment as it is a routine concern. - Postpone hematology appointment - Assist with transportation if needed for future appointments  Family/ staff Communication: nursing, discussed during campus interdisciplinary team meeting as well, seen by wound care specialist today for debridement.  Labs/tests ordered:  BMP pending   I spent greater than 30 minutes for the care of this patient in face to face time, chart review, clinical documentation, patient education.  Patient is at high risk for decline and readmission. She has had signs of skin failure secondary to poor perfusion in the setting of PVD and CHF. Her wounds have bene present for an unclear amount of time due to poor positioning in chairs in the home which could be contributing to progression of wound. Albumin  2.8. She shows signs of malnourisment. Will make sure she has adquate vitamin and protein supplementation to optimize wound healing

## 2024-04-24 NOTE — Telephone Encounter (Signed)
 Faxed order to [employer] at [phone].

## 2024-04-25 NOTE — Telephone Encounter (Signed)
 I have called to ask them to DC they state order needs to be faxed. Advised that we have faxed that and confirmed number. They state they have not received. Will re fax as requested.

## 2024-04-26 ENCOUNTER — Ambulatory Visit: Payer: Medicare Other

## 2024-04-26 ENCOUNTER — Ambulatory Visit: Payer: Self-pay | Admitting: Cardiovascular Disease

## 2024-04-29 ENCOUNTER — Encounter: Payer: Self-pay | Admitting: Student

## 2024-04-29 ENCOUNTER — Non-Acute Institutional Stay (SKILLED_NURSING_FACILITY): Admitting: Student

## 2024-04-29 DIAGNOSIS — R609 Edema, unspecified: Secondary | ICD-10-CM

## 2024-04-29 DIAGNOSIS — I959 Hypotension, unspecified: Secondary | ICD-10-CM | POA: Diagnosis not present

## 2024-04-29 DIAGNOSIS — I509 Heart failure, unspecified: Secondary | ICD-10-CM | POA: Diagnosis not present

## 2024-04-29 DIAGNOSIS — R34 Anuria and oliguria: Secondary | ICD-10-CM

## 2024-04-29 LAB — CUP PACEART INCLINIC DEVICE CHECK
Battery Remaining Longevity: 12 mo
Battery Voltage: 2.8 V
Brady Statistic RA Percent Paced: 75 %
Brady Statistic RV Percent Paced: 98 %
Date Time Interrogation Session: 20250522092800
Implantable Lead Connection Status: 753985
Implantable Lead Connection Status: 753985
Implantable Lead Implant Date: 20170907
Implantable Lead Implant Date: 20170907
Implantable Lead Location: 753859
Implantable Lead Location: 753860
Implantable Pulse Generator Implant Date: 20170907
Lead Channel Impedance Value: 362.5 Ohm
Lead Channel Impedance Value: 475 Ohm
Lead Channel Pacing Threshold Amplitude: 0.75 V
Lead Channel Pacing Threshold Amplitude: 0.75 V
Lead Channel Pacing Threshold Pulse Width: 0.5 ms
Lead Channel Pacing Threshold Pulse Width: 0.5 ms
Lead Channel Sensing Intrinsic Amplitude: 10.1 mV
Lead Channel Sensing Intrinsic Amplitude: 2.3 mV
Lead Channel Setting Pacing Amplitude: 2 V
Lead Channel Setting Pacing Amplitude: 2 V
Lead Channel Setting Pacing Pulse Width: 0.5 ms
Lead Channel Setting Sensing Sensitivity: 2.5 mV
Pulse Gen Model: 2272
Pulse Gen Serial Number: 7945283

## 2024-04-29 LAB — BASIC METABOLIC PANEL WITH GFR
BUN: 97 — AB (ref 4–21)
CO2: 31 — AB (ref 13–22)
Chloride: 96 — AB (ref 99–108)
Creatinine: 1.9 — AB (ref 0.5–1.1)
Glucose: 71
Potassium: 4.8 meq/L (ref 3.5–5.1)
Sodium: 135 — AB (ref 137–147)

## 2024-04-29 LAB — COMPREHENSIVE METABOLIC PANEL WITH GFR
Calcium: 8.5 — AB (ref 8.7–10.7)
eGFR: 26

## 2024-04-29 NOTE — Progress Notes (Unsigned)
 Location:  Other [facility]) Nursing Home Room Number: [place] 109-A Place of Service:  SNF (31) ([place]) Provider:  Abdul Fine, MD   Patient Care Team: [name], MD as PCP - General (Family Medicine) Francyne Headland, MD as PCP - Cardiology (Cardiology) Teressa Toribio SQUIBB, MD (Inactive) as Consulting Physician (Gastroenterology) Dewey Rush, MD as Consulting Physician (Radiation Oncology) Odogwu, Graylin FERNS, MD as Consulting Physician (Hematology and Oncology) Maye Ade, MD (Inactive) as Consulting Physician (Cardiology) Geronimo Amel, MD as Consulting Physician (Pulmonary Disease) Aron Shoulders, MD (General Surgery) Croitoru, Headland, MD as Consulting Physician (Cardiology) Rosan Credit, MD as Consulting Physician (Ophthalmology) [place], Amy, MD as Consulting Physician (Dermatology) Skeet Juliene SAUNDERS, DO as Consulting Physician (Neurology)  Extended Emergency Contact Information Primary Emergency Contact: Berntsen,James  [place] Mobile Phone: [phone] Relation: Son Secondary Emergency Contact: [facility] Phone: [phone] Relation: Granddaughter Interpreter needed? No  Code Status:  DNR Goals of care: Advanced Directive information    04/29/2024    2:38 PM  Advanced Directives  Does Patient Have a Medical Advance Directive? Yes  Type of Estate agent of [place];Living will;Out of facility DNR (pink MOST or yellow form)  Does patient want to make changes to medical advance directive? No - Patient declined  Copy of Healthcare Power of Attorney in Chart? Yes - validated most recent copy scanned in chart (See row information)     Chief Complaint  Patient presents with  . Heart Problem    Chronic Heart Failure management    HPI:  Pt is a 78 y.o. female seen today for an acute visit CHF follow up - patient has gained 4 lbs this weekend. History of Present Illness The patient presents  with leg pain and decreased urination.  She experiences severe leg pain and significant swelling, particularly above the knees, described as feeling like 'somebody sticks a poker there every once in a while.'  She has been experiencing decreased urination despite previously being on a diuretic. She notes a weight gain of almost six pounds over the past three days, which she attributes to fluid retention. She has been taking Lasix  every morning at home, which previously resulted in frequent urination. Currently, she is not receiving her diuretic as expected, which she believes has contributed to her current symptoms.  She reports low blood pressure, which is a new symptom for her. She expresses surprise at this development, as she was not previously aware of having low blood pressure issues.  She is concerned about her ability to walk independently and wants to be able to get up and use a walker without assistance, especially when she needs to urinate frequently.  Past Medical History - Edema - Oliguria - Hypotension - Heart failure    Medications - Metolazone   Physical Exam CHEST: Expiratory wheezing. ABDOMEN: Non-tender abdomen. EXTREMITIES: Swelling in legs above knees.  Assessment and Plan    Past Medical History:  Diagnosis Date  . 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 Acute cystitis without hematuria 12/22/2015  . Acute right-sided low back pain without sciatica 11/26/2019   Saw Elsner 12/2019 - planned L2/3 translaminar epidural steroid injection. Known DISH with thoracic spine fusion and moderate kyphosis, h/o L spine fusion L2-5  . Adjustment disorder with depressed mood 10/21/2014   44 yo son died MI 10/14/14 Husband died car accident April 15, 2015  . Anemia   . Anticoagulant long-term use 06/14/2019   She takes warfarin for afib/flutter with CHADS2VASc score of 4 (age, sex,  CHF, HTN), s/p 2 bioprosthetic valve replacements Coumadin  changed to eliquis  2022 per  cardiology  . Aortic atherosclerosis 03/03/2022   Noted on 01/2022 chest CT  . Arthritis   . Arthropathy of spinal facet joint 03/08/2018  . Ascending aortic aneurysm 03/02/2021   4.1cm on CT 01/2021 rec yearly monitoring  . Bilateral hip pain 09/08/2016  . Cancer (HCC)    conon cancer 2012  . CAP (community acquired pneumonia) 02/08/2016  . Carotid stenosis 12/08/2020   Carotid US  - 40-59% BICA stenosis (2015) Carotid US  12/2020 - 1-39% BICA stenosis, incidental 3.4cm structure behind L common carotid artery rec further imaging   . Cataracts, bilateral    immature  . Chest pain 08/25/2014   myoview  low risk 02/2018 ([place])  . Chronic cholecystitis with calculus 01/21/2015   S/p cholecystectomy   . Chronic diastolic heart failure 09/19/2014  . Chronic insomnia   . Chronic lower back pain 03/05/2008   Returned to Dr Colon - translaminar ESI at L3/4. Myelogram showed significant left sided foraminal stenosis at L2/3 and L3/4 in setting of solid arthrodesis, planned DEXA and if stable osteopenia, considering anterolateral decompression with spacer.   . CKD (chronic kidney disease) stage 2, GFR 60-89 ml/min 07/03/2015  . Constipation    takes Miralax  daily as needed  . COPD (chronic obstructive pulmonary disease)    Albuterol  inhaler daily as needed;Duoneb daily as needed;Spiriva  daily  . COVID-19 virus infection 11/11/2022  . DDD (degenerative disc disease)    cervical - kyphosis with mod DD changes C5/6 and C6/7; lumbar - early DD at L2/3 (Elsner)  . Depression   . DISH (diffuse idiopathic skeletal hyperostosis) 05/14/2019   By MRI noted fusion of lower thoracic vertebrae - likely contributing to unsteadiness (Elsner)  . Dyspnea on exertion 06/05/2015   myoview  low risk 02/2018  . Edema of both lower legs due to peripheral venous insufficiency   . Essential hypertension, benign 03/24/2009   hx of not on nmeds currenlty and has been a while since on meds per pt  . Heart murmur   .  Hematuria 06/17/2021  . History of kidney stones   . History of radiation therapy 05/30/11 to 07/07/11   rectum  . History of rectal cancer 05/26/2011   Rectal cancer 2012, midrectum ypT3ypN0, s/p neoadj chemo&XRT, lap LAR with diverting loop ileostomy 08/2011, ileostomy takedown 03/2012 Discharged from oncology clinic 2017 Marquita)  . History of shingles   . History of vertebral fracture 11/26/2019   Old L1 vertebral compression fracture incidentally noted on imaging 2020   . Hyperlipemia    takes Lovastatin  daily  . Lacunar infarction    Left caudate  . Left hip pain 07/29/2020  . Legally blind in left eye   . Lymphangioma 01/07/2021   Noted incidentally on carotid US  12/2020 CT neck - Likely benign lymphangioma 01/2021  . Macrocytosis 03/27/2022   Folate and b12 checked 2023  . Malaise and fatigue 01/22/2018  . Meralgia paresthetica 12/13/2007  . Mixed stress and urge urinary incontinence 03/16/2015   Failed oxybutynin  IR/ER. Vesicare  initially helped but then no longer helpful.  myrbetriq  was not effective.  Saw urology (MacDiarmid) - UDS largely overactive bladder with mild-mod stress incontinence. Improved on abx course - maintained on macrodantin  and toviaz .   . Obesity   . Osteopenia 12/2014   T -1.5 hip  . Other spondylosis with radiculopathy, lumbar region 07/17/2018  . Pain of right sacroiliac joint 08/09/2017  . Pancreatitis 11/2014   ?zpack  related vs gallstone pancreatitis with abnormal HIDA scan pending cholecystectomy  . Peripheral neuropathy 01/03/2022  . Persistent atrial fibrillation 12/09/2020  . Personal history of colonic polyps   . PONV (postoperative nausea and vomiting)   . Positive occult stool blood test   . Presence of permanent cardiac pacemaker   . 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 Pulmonary artery hypertension 06/17/2021   Enlarged pulmonic trunk, indicative pulmonary arterial hypertension.  . Pulmonary infiltrates 04/23/2019   06/13/2018-CT super D chest without contrast- generally stable chronic lung disease with pleural parenchymal scarring and scattered nodularity most of these nodules have been tree-in-bud in appearance likely postinfectious or inflammatory the dominant right lower lobe nodule seen on most recent study has resolved no new or enlarging nodules  04/22/2019-CT chest without contrast- waxing and waning per  . Pulmonary nodule 02/23/2012   RLL nodule-51mm stable 2006, April 2009, and June 2009 Small pulm nodules Jan 2012 - > Oct 2012 without change  . Rheumatic heart disease mitral stenosis    mod MS by echo 02/2014  . Right leg swelling 06/11/2020   R venous US  06/2020 WNL: - No evidence of deep vein thrombosis in the lower extremity. No indirect evidence of obstruction proximal to the inguinal ligament. - No cystic structure found in the popliteal fossa.  . S/P aortic and mitral valve bioprostheses 12/08/2013   AVR 21 mm, MVR 25 mm-MagnaEase pericardia  . S/P AVR (aortic valve replacement) 2015   bioprosthetic (Bartle)  . S/P MVR (mitral valve replacement) 2015   bioprosthetic (Bartle)  . Sepsis secondary to UTI (HCC) 08/22/2017  . Severe aortic valve stenosis 10/01/2013   mild-mod by echo 02/2014  . Skin lesion 01/03/2022  . Small bowel obstruction, partial 08/2013   reolved without NGT placement.   . 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 Syncope 05/28/2018  . Thrombocytopenia 11/04/2019  . Typical atrial flutter 05/15/2019  . Vitamin B12 deficiency 02/28/2015   Start B12 shots 02/2015   . Vitamin D  deficiency 10/23/2016  . Wounds, multiple    bilateral legs    Past Surgical History:  Procedure Laterality Date  . ANTERIOR LAT LUMBAR FUSION Left 07/17/2018   Procedure: Lumbar Two-Three Lumbar Three-Four Anterolateral decompression/interbody fusion with lateral plate fixation/Infuse;  Surgeon: Colon Shove, MD;  Location: [facility] OR;  Service: Neurosurgery;  Laterality: Left;  Lumbar Two-Three Lumbar Three-Four Anterolateral decompression/interbody fusion with lateral plate fixation/Infuse  . AORTIC VALVE REPLACEMENT N/A 12/12/2013   Procedure: AORTIC VALVE REPLACEMENT (AVR);  Surgeon: Dorise MARLA Fellers, MD; Service: Open Heart Surgery  . BACK SURGERY  2006, 2007   2006 SPACER, 2007 decompression and fusion L4/5  . BOWEL RESECTION  04/02/2012   Procedure: SMALL BOWEL RESECTION;  Surgeon: Jina Nephew, MD;  Location: [facility] ORS;  Service: General;  Laterality: N/A;  . BRONCHIAL WASHINGS  03/24/2021   Procedure: BRONCHIAL WASHINGS;  Surgeon: Geronimo Amel, MD;  Location: [facility] ENDOSCOPY;  Service: Endoscopy;;  . CARDIOVERSION N/A 08/29/2023   Procedure: CARDIOVERSION (CATH LAB);  Surgeon: Pietro Redell RAMAN, MD;  Location: [facility] INVASIVE CV LAB;  Service: Cardiovascular;  Laterality: N/A;  . CARPAL TUNNEL RELEASE Bilateral   .  CATARACT EXTRACTION W/ INTRAOCULAR LENS IMPLANT Left 10/18/2016  . CATARACT EXTRACTION W/ INTRAOCULAR LENS IMPLANT Right 12/13/2016   Dr. Rosan  . CHOLECYSTECTOMY N/A 01/21/2015   chronic cholecystitis, Jina Nephew, MD  . COLON RESECTION  08/25/2011   Procedure: COLON RESECTION LAPAROSCOPIC;  Surgeon: Jina Nephew, MD;  Location: [facility] ORS;  Service: General;  Laterality: N/A;  Laparoscopic Assisted Low Anterior Resection Diverting Ostomy and onQ pain pump  . COLONOSCOPY  03/2013   1 polyp, rpt 3 yrs Marianne)  . COLONOSCOPY  05/2021   diverticulosis and patent colon anastomosis with some edema/narrowing, rpt 5 yrs Marianne)  . COLONOSCOPY WITH PROPOFOL  N/A 06/09/2016   patent colo-colonic anastomosis, rpt 5 yrs Marianne)  .  COLONOSCOPY WITH PROPOFOL  N/A 01/25/2024   TA, fair prep (consider repeat if needed), patent rectal anastomosis with congestion edema and stenosis, mild sigmoid diverticulosis Zane, Lynnie, MD)  . CYSTOSCOPY/URETEROSCOPY/HOLMIUM LASER/STENT PLACEMENT Right 07/03/2023   Procedure: CYSTOSCOPY RIGHT RETROGRADE PYEPLGRAM RIGHT URETEROSCOPY/HOLMIUM LASER/STENT PLACEMENT;  Surgeon: Carolee Sherwood JONETTA DOUGLAS, MD;  Location: [facility] ORS;  Service: Urology;  Laterality: Right;  1 HR FOR CASE  . CYSTOSCOPY/URETEROSCOPY/HOLMIUM LASER/STENT PLACEMENT Right 07/17/2023   Procedure: CYSTOSCOPY/RIGHT URETEROSCOPY/STENT EXCHANGE;  Surgeon: Carolee Sherwood JONETTA DOUGLAS, MD;  Location: [facility] ORS;  Service: Urology;  Laterality: Right;  60 MINS FOR CASE  . EP IMPLANTABLE DEVICE N/A 06/16/2016   Procedure: Pacemaker Implant;  Surgeon: Will Gladis Norton, MD;  Location: [facility] INVASIVE CV LAB;  Service: Cardiovascular;  Laterality: N/A;  . ESOPHAGOGASTRODUODENOSCOPY (EGD) WITH PROPOFOL  N/A 01/25/2024   diffuse gastritis benign biopsy Zane Lynnie, MD)  . FOOT SURGERY  left foot   hammer toe  . ILEOSTOMY  08/25/2011  . ILEOSTOMY CLOSURE  04/02/2012   Procedure: ILEOSTOMY TAKEDOWN;  Surgeon: Jina Nephew, MD;  Location: [facility];  Service: General;  Laterality: N/A;  . INTRAOPERATIVE TRANSESOPHAGEAL ECHOCARDIOGRAM N/A 12/12/2013   Procedure: INTRAOPERATIVE TRANSESOPHAGEAL ECHOCARDIOGRAM;  Surgeon: Dorise MARLA Fellers, MD;  Location: [facility] OR;  Service: Open Heart Surgery;  Laterality: N/A;  . LEFT AND RIGHT HEART CATHETERIZATION WITH CORONARY ANGIOGRAM N/A 11/27/2013   Procedure: LEFT AND RIGHT HEART CATHETERIZATION WITH CORONARY ANGIOGRAM;  Surgeon: [name], MD;  Location: [facility] CATH LAB;  Service: Cardiovascular;  Laterality: N/A;  . MITRAL VALVE REPLACEMENT N/A 12/12/2013   Procedure: MITRAL VALVE (MV) REPLACEMENT OR REPAIR;  Surgeon: Dorise MARLA Fellers, MD; Service: Open Heart Surgery  . POLYPECTOMY  01/25/2024   Procedure: POLYPECTOMY, INTESTINE;   Surgeon: Charlanne Lynnie, MD;  Location: [facility] ENDOS[facility];  Service: Gastroenterology;;  . TEE WITHOUT CARDIOVERSION N/A 11/29/2013   Procedure: TRANSESOPHAGEAL ECHOCARDIOGRAM (TEE);  Surgeon: Wilbert JONELLE Bihari, MD;  Location[facility] ENDOSCOPY;  Service: Cardiovascular;  Laterality: N/A;  . TEE WITHOUT CARDIOVERSION N/A 06/04/2019   Procedure: TRANSESOPHAGEAL ECHOCARDIOGRAM (TEE);  Surgeon: Balding Jerel, MD;  Location: [facility] ENDOSCOPY;  Service: Cardiovascular;  Laterality: N/A;  . TONSILLECTOMY    . TRANSESOPHAGEAL ECHOCARDIOGRAM (CATH LAB) N/A 08/29/2023   Procedure: TRANSESOPHAGEAL ECHOCARDIOGRAM;  Surgeon: Pietro Redell RAMAN, MD;  Location: [facility] INVASIVE CV LAB;  Service: Cardiovascular;  Laterality: N/A;  . VIDEO BRONCHOSCOPY N/A 03/24/2021   Procedure: VIDEO BRONCHOSCOPY WITHOUT FLUORO;  Surgeon: Geronimo Amel, MD;  Location: [facility] ENDOSCOPY;  Service: Endoscopy;  Laterality: N/A;  SMALL SCOPE,PREMEDICATE WITH NEBULIZED LIDOCAINE     Allergies  Allergen Reactions  . Tape Other (See Comments)    THICK TAPES PULLS OFF MY SKIN  . Codeine Nausea Only  . Prednisone  Nausea Only  . Sulfa Antibiotics Nausea Only  Outpatient Encounter Medications as of 04/29/2024  Medication Sig  . acetaminophen  (TYLENOL ) 500 MG tablet Take 2 tablets (1,000 mg total) by mouth in the morning and at bedtime.  . albuterol  (VENTOLIN  HFA) 108 (90 Base) MCG/ACT inhaler Inhale 2 puffs into the lungs every 6 (six) hours as needed for wheezing.  . buPROPion  (WELLBUTRIN  SR) 150 MG 12 hr tablet TAKE 1 TABLET(150 MG) BY MOUTH EVERY MORNING  . cholecalciferol (VITAMIN D3) 25 MCG (1000 UNIT) tablet Take 1,000 Units by mouth daily.  . ELIQUIS  5 MG TABS tablet TAKE 1 TABLET(5 MG) BY MOUTH TWICE DAILY  . ferrous sulfate  325 (65 FE) MG EC tablet Take 1 tablet (325 mg total) by mouth daily with breakfast.  . Fluticasone -Umeclidin-Vilant (TRELEGY ELLIPTA ) 200-62.5-25 MCG/ACT AEPB Inhale 1 puff into the lungs daily.  . furosemide  (LASIX )  20 MG tablet Take 20 mg by mouth 2 (two) times daily. Give with 40mg  to equal 60mg .  Give one tablet by mouth every 24 hours as needed for weight gain of 3lbs in 24hrs or 5lbs in one week give with 40mg  to equal 60mg   . furosemide  (LASIX ) 40 MG tablet TAKE 1 TABLET BY MOUTH EVERY DAY. MAY TAKE AN EXTRA TABLET AS NEEDED FOR SWELLING (Patient taking differently: Take 40 mg by mouth 2 (two) times daily. Give along with 20 mg to equal 60mg . Give one tablet by mouth every 24 hours as needed for weight gain of 3lbs in 24 hours or 5lb in one week give along with 20mg  tablet.)  . gabapentin  (NEURONTIN ) 100 MG capsule Take 100 mg by mouth 2 (two) times daily.  . gabapentin  (NEURONTIN ) 300 MG capsule Take 300 mg by mouth at bedtime.  . lisinopril  (ZESTRIL ) 5 MG tablet Take 5 mg by mouth daily.  . lovastatin  (MEVACOR ) 40 MG tablet TAKE 1 TABLET(40 MG) BY MOUTH AT BEDTIME  . metolazone  (ZAROXOLYN ) 2.5 MG tablet Take 2.5 mg by mouth daily.  . metoprolol  succinate (TOPROL  XL) 25 MG 24 hr tablet Take 1 tablet (25 mg total) by mouth every evening.  . montelukast  (SINGULAIR ) 10 MG tablet TAKE 1 TABLET(10 MG) BY MOUTH AT BEDTIME  . Multiple Vitamins-Minerals (PRESERVISION AREDS 2 PO) Take 2 capsules by mouth in the morning.  . omeprazole  (PRILOSEC) 20 MG capsule Take 20 mg by mouth daily.  . potassium chloride  (KLOR-CON ) 10 MEQ tablet TAKE 2 TABLETS(20 MEQ) BY MOUTH DAILY  . Sodium Hypochlorite (ANASEPT) 0.057 % LIQD Apply topically.  . traMADol  (ULTRAM ) 50 MG tablet Take 1 tablet (50 mg total) by mouth 2 (two) times daily as needed.  . traZODone  (DESYREL ) 50 MG tablet Take 1 tablet (50 mg total) by mouth at bedtime.  . Vibegron  (GEMTESA ) 75 MG TABS Take 75 mg by mouth daily.  . vitamin B-12 (CYANOCOBALAMIN ) 500 MCG tablet Take 500 mcg by mouth 2 (two) times a week.  . ascorbic acid  (VITAMIN C) 500 MG tablet Take 1 tablet (500 mg total) by mouth daily. (Patient not taking: Reported on 04/29/2024)  . Biotin 1000 MCG  tablet Take 1,000 mcg by mouth daily. (Patient not taking: Reported on 04/29/2024)  . Zinc  50 MG CAPS Take 1 capsule (50 mg total) by mouth daily. (Patient not taking: Reported on 04/29/2024)   No facility-administered encounter medications on file as of 04/29/2024.    Review of Systems  Immunization History  Administered Date(s) Administered  .  sv, Bivalent, Protein Subunit Rsvpref,pf [place]) 07/13/2022  . Fluad Quad(high Dose 65+) 07/11/2019, 07/14/2020, 07/25/2022  .  Fluad Trivalent(High Dose 65+) 08/30/2023  . H1N1 09/17/2008  . Influenza Split 07/18/2012  . Influenza Whole 08/10/2005, 06/24/2009, 07/11/2011  . Influenza, High Dose Seasonal PF 07/04/2016, 07/04/2017, 07/27/2021  . Influenza,inj,Quad PF,6+ Mos 07/09/2018  . Influenza-Unspecified 07/24/2013, 06/30/2014, 06/30/2015  . Moderna Covid-19 Vaccine Bivalent Booster 47yrs & up 07/02/2021, 07/24/2023  . Moderna SARS-COV2 Booster Vaccination 08/14/2020  . Moderna Sars-Covid-2 Vaccination 02/26/2021  . PFIZER(Purple Top)SARS-COV-2 Vaccination 10/30/2019, 11/20/2019  . Pfizer(Comirnaty)Fall Seasonal Vaccine 12 years and older 08/15/2022  . Pneumococcal Conjugate-13 06/02/2014  . Pneumococcal Polysaccharide-23 08/10/2005, 04/21/2008, 06/25/2013  . Respiratory Syncytial Virus Vaccine,Recomb Aduvanted(Arexvy) 07/13/2022  . Td 10/11/2005  . Tdap 04/02/2024  . Zoster Recombinant(Shingrix) 06/25/2018, 08/28/2018  . Zoster, Live 09/16/2014   Pertinent  Health Maintenance Due  Topic Date Due  . MAMMOGRAM  04/13/2024  . INFLUENZA VACCINE  05/10/2024  . Colonoscopy  01/24/2029  . DEXA SCAN  Completed      12/05/2023    1:42 PM 12/06/2023    1:03 PM 02/05/2024    2:24 PM 03/14/2024    9:38 AM 03/25/2024   10:09 AM  Fall Risk  Falls in the past year? 1 1 1 1 1   Was there an injury with Fall? 0 0  0 0  Fall Risk Category Calculator 2 2  2 2   Patient at Risk for Falls Due to History of fall(s)    History of fall(s)  Fall risk  Follow up Falls evaluation completed       Functional Status Survey:    Vitals:   04/29/24 1440  BP: (!) 95/58  Pulse: 81  Resp: 20  Temp: 97.9 F (36.6 C)  SpO2: 92%  Weight: 160 lb (72.6 kg)  Height: 5' 4 (1.626 m)   Body mass index is 27.46 kg/m. Physical Exam  Labs reviewed: Recent Labs    06/21/23 0729 06/29/23 0800 08/30/23 0429 09/25/23 1145 04/02/24 1050 04/03/24 0418 04/08/24 0037 04/09/24 0419 04/10/24 0423 04/11/24 0000  NA 140   < > 132*   < > 140   < > 133* 133* 135 139  K 4.1 Hemolysis seen...   < > 4.4   < > 3.8   < > 3.8 3.6 3.5 3.3*  CL 101   < > 97*   < > 93*   < > 95* 92* 95* 94*  CO2 34*   < > 28   < > 36*   < > 32 32 31 40*  GLUCOSE 79   < > 120*   < > 114*   < > 133* 88 90  --   BUN 15   < > 26*   < > 31*   < > 39* 33* 28* 27*  CREATININE 0.88   < > 0.84   < > 1.12*   < > 0.88 0.84 0.76 0.9  CALCIUM  9.1   < > 8.9   < > 9.3   < > 8.8* 8.9 8.9 8.6*  MG  --    < > 2.2  --  2.1  --  2.0  --   --   --   PHOS 3.7  --   --   --   --   --   --   --   --   --    < > = values in this interval not displayed.   Recent Labs    04/02/24 1050 04/07/24 0401 04/08/24 0037 04/11/24 0000  AST 17 19 18  29  ALT 12 13 13 21   ALKPHOS 74 55 53 115  BILITOT 0.7 0.5 0.7  --   PROT 6.9 6.0* 5.7*  --   ALBUMIN  2.8* 2.4* 2.3* 2.8*   Recent Labs    01/05/24 1110 03/18/24 1615 04/02/24 1050 04/03/24 0418 04/08/24 0037 04/09/24 0419 04/10/24 0423 04/11/24 0000  WBC 8.1   < > 11.5*   < > 8.6 8.4 8.2 6.7  NEUTROABS 6.7  --  10.3*  --   --   --   --  5,668.00  HGB 13.4   < > 12.3   < > 9.7* 10.6* 10.3* 10.4*  HCT 42.5   < > 38.2   < > 30.1* 29.9* 29.7* 33*  MCV 104.4*   < > 111.4*   < > 106.4* 106.8* 104.2*  --   PLT 75*   < > 81*   < > 54* 65* 74* 78*   < > = values in this interval not displayed.   Lab Results  Component Value Date   TSH 1.395 04/02/2024   Lab Results  Component Value Date   HGBA1C 5.5 12/06/2013   Lab Results  Component  Value Date   CHOL 152 06/21/2023   HDL 67.70 06/21/2023   LDLCALC 67 06/21/2023   LDLDIRECT 111.0 06/26/2015   TRIG 85.0 06/21/2023   CHOLHDL 2 06/21/2023    Significant Diagnostic Results in last 30 days:  US  THORACENTESIS ASP PLEURAL SPACE W/IMG GUIDE Result Date: 04/06/2024 INDICATION: 142230 Pleural effusion 142230 Shortness of breath. Left-sided pleural effusion. Request for diagnostic and therapeutic thoracentesis. EXAM: ULTRASOUND GUIDED LEFT THORACENTESIS MEDICATIONS: 1% plain lidocaine , 4 mL COMPLICATIONS: None immediate. PROCEDURE: An ultrasound guided thoracentesis was thoroughly discussed with the patient and questions answered. The benefits, risks, alternatives and complications were also discussed. The patient understands and wishes to proceed with the procedure. Written consent was obtained. Ultrasound was performed to localize and mark an adequate pocket of fluid in the left chest. The area was then prepped and draped in the normal sterile fashion. 1% Lidocaine  was used for local anesthesia. Under ultrasound guidance a 6 Fr Safe-T-Centesis catheter was introduced. Thoracentesis was performed. The catheter was removed and a dressing applied. FINDINGS: A total of approximately 400 mL of hazy, serosanguineous fluid was removed. Samples were sent to the laboratory as requested by the clinical team. IMPRESSION: Successful ultrasound guided LEFT thoracentesis yielding 400 mL of pleural fluid. Procedure performed by Franky Rusk PA-C and supervised by Dr. Thom Hall Electronically Signed   By: Thom Hall [name]   On: 04/06/2024 15:00   DG Chest Port 1 View Result Date: 04/06/2024 CLINICAL DATA:  [id number] Status post thoracentesis [id number] EXAM: PORTABLE CHEST - 1 VIEW COMPARISON:  04/05/2024 FINDINGS: The apices are excluded despite repeating the exposure. No visualized pneumothorax. Interval decrease in left pleural effusion. Persistent blunting of the right lateral costophrenic angle. Mild  coarse interstitial and alveolar opacities bilaterally as before, with some patchy consolidation/atelectasis in the lung bases, improved on the left since prior study. Heart size and mediastinal contours are within normal limits. Post AVR. Stable left subclavian transvenous pacemaker. Sternotomy wires. IMPRESSION: 1. No pneumothorax status post left thoracentesis. 2. Persistent right pleural effusion and bibasilar consolidation/atelectasis. Electronically Signed   By: JONETTA Faes [name]   On: 04/06/2024 12:28   DG CHEST PORT 1 VIEW Result Date: 04/05/2024 CLINICAL DATA:  Pleural effusion EXAM: PORTABLE CHEST 1 VIEW COMPARISON:  04/05/2024 FINDINGS: LEFT-sided pacemaker overlies normal cardiac silhouette. Midline sternotomy.  Moderate LEFT pleural effusion is noted. Interval improvement in RIGHT lower lobe airspace disease seen on comparison exam IMPRESSION: 1. Persistent but improved RIGHT lobe airspace disease. 2. Persistent LEFT lung effusion. Electronically Signed   By: Jackquline Boxer [name]   On: 04/05/2024 14:37   ECHOCARDIOGRAM COMPLETE Result Date: 04/04/2024    ECHOCARDIOGRAM REPORT   Patient Name:   SUSANN LAWHORNE Date of Exam: 04/04/2024 Medical Rec #:  [id number]            Height:       64.0 in Accession #:    [id number]           Weight:       141.1 lb Date of Birth:  1945/10/26            BSA:          1.687 m Patient Age:    56 years             BP:           139/63 mmHg Patient Gender: F                    HR:           70 bpm. Exam Location:  Inpatient Procedure: 2D Echo, Cardiac Doppler and Color Doppler (Both Spectral and Color            Flow Doppler were utilized during procedure). Indications:    R06.02 SOB  History:        Patient has prior history of Echocardiogram examinations, most                 recent 08/29/2023. Abnormal ECG and Pacemaker, COPD, Aortic                 Valve Disease and Mitral Valve Disease, Arrythmias:Atrial                 Fibrillation, Signs/Symptoms:Chest Pain,  Shortness of Breath,                 Dyspnea, Edema and Syncope; Risk Factors:Hypertension and                 Dyslipidemia. Cancer. PFO. MVR. AVR.                 Aortic Valve: 21 mm Magna Ease valve is present in the aortic                 position. Procedure Date: 12/08/2013.                 Mitral Valve: 25 mm bioprosthetic valve valve is present in the                 mitral position. Procedure Date: 12/08/2013.  Sonographer:    Ellouise Mose RDCS Referring Phys: [phone] JESSICA U [facility]  Sonographer Comments: Technically difficult study due to poor echo windows. Patient could not lie back. IMPRESSIONS  1. Left ventricular ejection fraction, by estimation, is 65 to 70%. The left ventricle has normal function. The left ventricle has no regional wall motion abnormalities. There is moderate concentric left ventricular hypertrophy. Left ventricular diastolic parameters are indeterminate.  2. Right ventricular systolic function is moderately reduced. The right ventricular size is mildly enlarged. There is severely elevated pulmonary artery systolic pressure. The estimated right ventricular systolic pressure is 61.9 mmHg.  3. Right atrial size was mildly dilated.  4. The mitral valve has  been repaired/replaced. No evidence of mitral valve regurgitation. Severe mitral stenosis. The mean mitral valve gradient is 13.0 mmHg. There is a 25 mm bioprosthetic valve present in the mitral position. Procedure Date: 12/08/2013. Echo findings are consistent with stenosis the mitral prosthesis.  5. Large pleural effusion.  6. Tricuspid valve regurgitation is moderate.  7. The aortic valve has been repaired/replaced. Aortic valve regurgitation is not visualized. Aortic valve sclerosis is present, with no evidence of aortic valve stenosis. There is a 21 mm Magna Ease valve present in the aortic position. Procedure Date:  12/08/2013.  8. Aortic dilatation noted. There is mild dilatation of the ascending aorta, measuring 42 mm.  9. The inferior vena  cava is dilated in size with >50% respiratory variability, suggesting right atrial pressure of 8 mmHg. FINDINGS  Left Ventricle: Left ventricular ejection fraction, by estimation, is 65 to 70%. The left ventricle has normal function. The left ventricle has no regional wall motion abnormalities. The left ventricular internal cavity size was small. There is moderate  concentric left ventricular hypertrophy. Left ventricular diastolic function could not be evaluated due to mitral valve replacement. Left ventricular diastolic parameters are indeterminate. Right Ventricle: The right ventricular size is mildly enlarged. No increase in right ventricular wall thickness. Right ventricular systolic function is moderately reduced. There is severely elevated pulmonary artery systolic pressure. The tricuspid regurgitant velocity is 3.67 m/s, and with an assumed right atrial pressure of 8 mmHg, the estimated right ventricular systolic pressure is 61.9 mmHg. Left Atrium: Left atrial size was not well visualized. Right Atrium: Right atrial size was mildly dilated. Pericardium: There is no evidence of pericardial effusion. Mitral Valve: The mitral valve has been repaired/replaced. No evidence of mitral valve regurgitation. There is a 25 mm bioprosthetic valve present in the mitral position. Procedure Date: 12/08/2013. Echo findings are consistent with stenosis of the mitral prosthesis. Severe mitral valve stenosis. MV peak gradient, 22.5 mmHg. The mean mitral valve gradient is 13.0 mmHg. Tricuspid Valve: The tricuspid valve is normal in structure. Tricuspid valve regurgitation is moderate . No evidence of tricuspid stenosis. Aortic Valve: Acceleration time borderline, likely normal valve function. The aortic valve has been repaired/replaced. Aortic valve regurgitation is not visualized. Aortic valve sclerosis is present, with no evidence of aortic valve stenosis. Aortic valve mean gradient measures 13.0 mmHg. Aortic valve peak  gradient measures 24.1 mmHg. Aortic valve area, by VTI measures 1.32 cm. There is a 21 mm Magna Ease valve present in the aortic position. Procedure Date: 12/08/2013. Pulmonic Valve: The pulmonic valve was normal in structure. Pulmonic valve regurgitation is mild. No evidence of pulmonic stenosis. Aorta: The aortic root is normal in size and structure and aortic dilatation noted. There is mild dilatation of the ascending aorta, measuring 42 mm. Venous: The inferior vena cava is dilated in size with greater than 50% respiratory variability, suggesting right atrial pressure of 8 mmHg. IAS/Shunts: The interatrial septum was not well visualized. Additional Comments: There is a large pleural effusion.  LEFT VENTRICLE PLAX 2D LVIDd:         3.70 cm     Diastology LVIDs:         2.60 cm     LV e' medial:    3.26 cm/s LV PW:         2.10 cm     LV E/e' medial:  33.3 LV IVS:        1.20 cm     LV e' lateral:   4.90 cm/s LVOT  diam:     2.10 cm     LV E/e' lateral: 22.2 LV SV:         59 LV SV Index:   35 LVOT Area:     3.46 cm  LV Volumes (MOD) LV vol d, MOD A2C: 64.2 ml LV vol d, MOD A4C: 48.8 ml LV vol s, MOD A2C: 12.4 ml LV vol s, MOD A4C: 19.1 ml LV SV MOD A2C:     51.8 ml LV SV MOD A4C:     48.8 ml LV SV MOD BP:      40.8 ml RIGHT VENTRICLE            IVC RV S prime:     5.11 cm/s  IVC diam: 2.60 cm TAPSE (M-mode): 1.2 cm LEFT ATRIUM             Index        RIGHT ATRIUM           Index LA diam:        3.00 cm 1.78 cm/m   RA Area:     18.30 cm LA Vol (A2C):   59.0 ml 34.98 ml/m  RA Volume:   55.30 ml  32.78 ml/m LA Vol (A4C):   37.4 ml 22.17 ml/m LA Biplane Vol: 47.3 ml 28.04 ml/m  AORTIC VALVE                     PULMONIC VALVE AV Area (Vmax):    1.24 cm      PR End Diast Vel: 1.70 msec AV Area (Vmean):   1.20 cm AV Area (VTI):     1.32 cm AV Vmax:           245.67 cm/s AV Vmean:          169.667 cm/s AV VTI:            0.443 m AV Peak Grad:      24.1 mmHg AV Mean Grad:      13.0 mmHg LVOT Vmax:         87.90  cm/s LVOT Vmean:        58.900 cm/s LVOT VTI:          0.169 m LVOT/AV VTI ratio: 0.38  AORTA Ao Root diam: 3.20 cm Ao Asc diam:  4.07 cm MITRAL VALVE                TRICUSPID VALVE MV Area (PHT): 2.24 cm     TR Peak grad:   53.9 mmHg MV Area VTI:   0.80 cm     TR Vmax:        367.00 cm/s MV Peak grad:  22.5 mmHg MV Mean grad:  13.0 mmHg    SHUNTS MV Vmax:       2.37 m/s     Systemic VTI:  0.17 m MV Vmean:      168.0 cm/s   Systemic Diam: 2.10 cm MV Decel Time: 338 msec MV E velocity: 108.56 cm/s MV A velocity: 220.00 cm/s MV E/A ratio:  0.49 Morene Brownie Electronically signed by Morene Brownie Signature Date/Time: 04/04/2024/12:26:32 PM    Final    DG ESOPHAGUS W SINGLE CM (SOL OR THIN BA) Result Date: 04/03/2024 CLINICAL DATA:  Dysphagia. Recurrent phlegm in throat, which patient is unable to swallow. EXAM: ESOPHAGUS/BARIUM SWALLOW STUDY TECHNIQUE: Single contrast esophagram was performed using thin liquid barium. Exam was technically limited by patient's frail physical condition, as she  was unable to stand erect, lay supine, or roll on fluoroscopy table. Exam was limited to LPO and RPO projections in semi-erect position. FLUOROSCOPY: Radiation Exposure Index (as provided by the fluoroscopic device): 33.1 mGy Kerma COMPARISON:  None Available. FINDINGS: No evidence of esophageal obstruction, constricting mass, or stricture. Elevated left hemidiaphragm noted. No hiatal hernia seen. No gastroesophageal reflux demonstrated on limited exam. IMPRESSION: Technically limited exam. No evidence of esophageal stricture or hiatal hernia. Electronically Signed   By: Norleen DELENA Kil [name]   On: 04/03/2024 15:52   DG Chest 2 View Result Date: 04/02/2024 CLINICAL DATA:  recent PNA and CHF, worsening oxygen requirement, cough, SOB, wheeze, ronchi and large rales, peripheral edema. EXAM: CHEST - 2 VIEW COMPARISON:  03/21/2024. FINDINGS: Re-demonstration of left retrocardiac airspace opacity obscuring the left hemidiaphragm,  descending thoracic aorta and blunting the left lateral costophrenic angle, suggesting combination of left lung atelectasis and/or consolidation with pleural effusion. No significant interval change. There are new heterogeneous opacities overlying the right mid lower lung zones without volume loss, compatible with pneumonia. There is trace right pleural effusion, grossly unchanged since the prior study. Redemonstration of right apical pleural cap, unchanged. Redemonstration of chronic interstitial predominance, similar to the prior study. No frank pulmonary edema. Stable cardio-mediastinal silhouette. There is a left sided 2-lead pacemaker. 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 No acute osseous abnormalities. The soft tissues are within normal limits. IMPRESSION: 1. New heterogeneous opacities overlying the right mid lower lung zones, compatible with pneumonia. 2. Stable left retrocardiac airspace opacity, suggesting combination of left lung atelectasis and/or consolidation with pleural effusion. 3. Stable trace right pleural effusion. Electronically Signed   By: Ree Molt [name]   On: 04/02/2024 12:45   CT Head Wo Contrast Result Date: 04/02/2024 CLINICAL DATA:  Head trauma, minor (Age >= 65y) Head trauma, moderate-severe fall on thinners, sleepy EXAM: CT HEAD WITHOUT CONTRAST TECHNIQUE: Contiguous axial images were obtained from the base of the skull through the vertex without intravenous contrast. RADIATION DOSE REDUCTION: This exam was performed according to the departmental dose-optimization program which includes automated exposure control, adjustment of the mA and/or kV according to patient size and/or use of iterative reconstruction technique. COMPARISON:  CT of the head dated October 09, 2022. FINDINGS: Brain: Chronic lacunar infarct within the left caudate nucleus. Mild  periventricular white matter disease. No evidence of hemorrhage, mass, acute cortical infarct or hydrocephalus. Vascular: Moderate calcific atheromatous disease within the carotid siphons and vertebral arteries. Skull: Intact and unremarkable. Sinuses/Orbits: Status post bilateral lens replacement. Paranasal sinuses are clear. Other: Negative. IMPRESSION: 1. No evidence of acute traumatic injury. 2. Stable chronic ischemic small vessel disease. Electronically Signed   By: Evalene Coho [name]   On: 04/02/2024 12:25    Assessment/Plan .Heart failure Concern for worsening heart failure indicated by fluid retention, leg swelling, and decreased urine output. Possible decreased cardiac function. - Monitor kidney function tests to determine need for potassium supplementation - Adjust diuretic therapy based on kidney function tests  Fluid retention and leg swelling Severe leg swelling with pain, particularly above the knees, likely due to fluid retention. Recent weight gain of almost six pounds in three days. Compression stockings are helping with swelling but are uncomfortable. Swelling is higher up the legs where compression is not applied. - Reinitiate Lasix  60 mg daily - Continue metolazone  - Consider changing compression stockings to ACE bandages for  comfort  Decreased urine output Decreased urine output possibly due to inadequate diuretic therapy. Bladder scan may be needed to assess for urinary retention. - Reinitiate Lasix  60 mg daily - Perform bladder scan to assess for urinary retention  Low blood pressure Low blood pressure may be contributing to difficulty with therapy. Midodrine is considered to help maintain blood pressure for therapy participation. - Consider starting midodrine to maintain blood pressure  Discharge planning and insurance coordination Insurance requires one-person assistance for discharge, but she needs to demonstrate sufficient walking distance with assistance.  Concern about ability to manage independently at home, especially with increased diuresis. - Ensure notes are forwarded to insurance for review   Family/ staff Communication: nursing  Labs/tests ordered:  none

## 2024-04-30 ENCOUNTER — Encounter: Payer: Self-pay | Admitting: Nurse Practitioner

## 2024-04-30 ENCOUNTER — Non-Acute Institutional Stay (SKILLED_NURSING_FACILITY): Admitting: Nurse Practitioner

## 2024-04-30 DIAGNOSIS — G6289 Other specified polyneuropathies: Secondary | ICD-10-CM | POA: Diagnosis not present

## 2024-04-30 DIAGNOSIS — I83029 Varicose veins of left lower extremity with ulcer of unspecified site: Secondary | ICD-10-CM

## 2024-04-30 DIAGNOSIS — I83019 Varicose veins of right lower extremity with ulcer of unspecified site: Secondary | ICD-10-CM

## 2024-04-30 DIAGNOSIS — L97929 Non-pressure chronic ulcer of unspecified part of left lower leg with unspecified severity: Secondary | ICD-10-CM

## 2024-04-30 DIAGNOSIS — I509 Heart failure, unspecified: Secondary | ICD-10-CM | POA: Diagnosis not present

## 2024-04-30 DIAGNOSIS — L97919 Non-pressure chronic ulcer of unspecified part of right lower leg with unspecified severity: Secondary | ICD-10-CM

## 2024-04-30 NOTE — Progress Notes (Unsigned)
 Location:  [facility].  Nursing Home Room Number: [facility] [id number] Place of Service:  SNF [phone]) Harlene An, NP  PCP: [name], MD  Patient Care Team: [name], MD as PCP - General (Family Medicine) Francyne Headland, MD as PCP - Cardiology (Cardiology) Teressa Toribio SQUIBB, MD (Inactive) as Consulting Physician (Gastroenterology) Dewey Rush, MD as Consulting Physician (Radiation Oncology) Odogwu, Graylin FERNS, MD as Consulting Physician (Hematology and Oncology) Maye Ade, MD (Inactive) as Consulting Physician (Cardiology) Geronimo Amel, MD as Consulting Physician (Pulmonary Disease) Aron Shoulders, MD (General Surgery) Croitoru, Headland, MD as Consulting Physician (Cardiology) Rosan Credit, MD as Consulting Physician (Ophthalmology) [place], Amy, MD as Consulting Physician (Dermatology) Skeet Juliene SAUNDERS, DO as Consulting Physician (Neurology)  Extended Emergency Contact Information Primary Emergency Contact: [name]  [place] Mobile Phone: [phone] Relation: Son Secondary Emergency Contact: [facility] Phone: [phone] Relation: Granddaughter Interpreter needed? No  Goals of care: Advanced Directive information    04/29/2024    2:38 PM  Advanced Directives  Does Patient Have a Medical Advance Directive? Yes  Type of Estate agent of [place];Living will;Out of facility DNR (pink MOST or yellow form)  Does patient want to make changes to medical advance directive? No - Patient declined  Copy of Healthcare Power of Attorney in Chart? Yes - validated most recent copy scanned in chart (See row information)     Chief Complaint  Patient presents with   [name] Concern    [name] Concern    HPI:  Pt is a 78 y.o. female seen today for an acute visit for Concerns with [employer].  Pt being seen at the request of staff. Pt with hx of CHF with significant LE edema She has been  wearing unna boots to control the swelling and to help with healing of venous ulcer to left shin and skin tear from fall- prior to admission on right.  Overall the wounds have been healing.  She was seen by Dr Abdul yesterday due to weight gain and worsening CHF and diuretic was increased.  She was also scheduled tylenol  due to pain but reports pain more of a shooting pain up her leg and has hx of neuropathy.  She informed staff she wanted to cut off unna boots herself due to the pain/discomfort She is taking gabapentin  100 mg twice daily and 300 mg at bedtime. She does report is is able to sleep at night with the 300mg .      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 Known DISH with thoracic spine fusion and moderate kyphosis, h/o L spine fusion L2-5   Adjustment disorder with depressed mood 11-01-14   89 yo son died MI 10/25/2014 Husband died car accident April 26, 2015   Anemia    Anticoagulant long-term use 06/14/2019   She takes warfarin for afib/flutter with CHADS2VASc score of 4 (age, sex, CHF, HTN), s/p 2 bioprosthetic valve replacements Coumadin  changed to eliquis  2022 per cardiology   Aortic atherosclerosis 03/03/2022   Noted on 01/2022 chest CT   Arthritis    Arthropathy of spinal facet joint 03/08/2018   Ascending aortic aneurysm 03/02/2021   4.1cm on CT 01/2021 rec yearly monitoring   Bilateral hip pain 09/08/2016   Cancer (HCC)    conon cancer 2012  CAP (community acquired pneumonia) 02/08/2016   Carotid stenosis 12/08/2020   Carotid US  - 40-59% BICA stenosis (2015) Carotid US  12/2020 - 1-39% BICA stenosis, incidental 3.4cm structure behind L common carotid artery rec further imaging    Cataracts, bilateral    immature   Chest pain 08/25/2014   myoview   low risk 02/2018 (Camitz)   Chronic cholecystitis with calculus 01/21/2015   S/p cholecystectomy    Chronic diastolic heart failure 09/19/2014   Chronic insomnia    Chronic lower back pain 03/05/2008   Returned to Dr Colon - translaminar ESI at L3/4. Myelogram showed significant left sided foraminal stenosis at L2/3 and L3/4 in setting of solid arthrodesis, planned DEXA and if stable osteopenia, considering anterolateral decompression with spacer.    CKD (chronic kidney disease) stage 2, GFR 60-89 ml/min 07/03/2015   Constipation    takes Miralax  daily as needed   COPD (chronic obstructive pulmonary disease)    Albuterol  inhaler daily as needed;Duoneb daily as needed;Spiriva  daily   COVID-19 virus infection 11/11/2022   DDD (degenerative disc disease)    cervical - kyphosis with mod DD changes C5/6 and C6/7; lumbar - early DD at L2/3 (Elsner)   Depression    DISH (diffuse idiopathic skeletal hyperostosis) 05/14/2019   By MRI noted fusion of lower thoracic vertebrae - likely contributing to unsteadiness (Elsner)   Dyspnea on exertion 06/05/2015   myoview  low risk 02/2018   Edema of both lower legs due to peripheral venous insufficiency    Essential hypertension, benign 03/24/2009   hx of not on nmeds currenlty and has been a while since on meds per pt   Heart murmur    Hematuria 06/17/2021   History of kidney stones    History of radiation therapy 05/30/11 to 07/07/11   rectum   History of rectal cancer 05/26/2011   Rectal cancer 2012, midrectum ypT3ypN0, s/p neoadj chemo&XRT, lap LAR with diverting loop ileostomy 08/2011, ileostomy takedown 03/2012 Discharged from oncology clinic 2017 Marquita)   History of shingles    History of vertebral fracture 11/26/2019   Old L1 vertebral compression fracture incidentally noted on imaging 2020    Hyperlipemia    takes Lovastatin  daily   Lacunar infarction    Left caudate   Left hip pain 07/29/2020   Legally blind in left eye    Lymphangioma  01/07/2021   Noted incidentally on carotid US  12/2020 CT neck - Likely benign lymphangioma 01/2021   Macrocytosis 03/27/2022   Folate and b12 checked 2023   Malaise and fatigue 01/22/2018   Meralgia paresthetica 12/13/2007   Mixed stress and urge urinary incontinence 03/16/2015   Failed oxybutynin  IR/ER. Vesicare  initially helped but then no longer helpful.  myrbetriq  was not effective.  Saw urology (MacDiarmid) - UDS largely overactive bladder with mild-mod stress incontinence. Improved on abx course - maintained on macrodantin  and toviaz .    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   Pulmonary infiltrates 04/23/2019   06/13/2018-CT super D chest without contrast- generally stable chronic lung disease with pleural parenchymal scarring and scattered nodularity most of these nodules have been tree-in-bud in appearance likely postinfectious or inflammatory the dominant right lower lobe nodule seen on most recent study has resolved no new or enlarging nodules  04/22/2019-CT chest  without contrast- waxing and waning per   Pulmonary nodule 02/23/2012   RLL nodule-20mm stable 2006, April 2009, and June 2009 Small pulm nodules Jan 2012 - > Oct 2012 without change   Rheumatic heart disease mitral stenosis    mod MS by echo 02/2014   Right leg swelling 06/11/2020   R venous US  06/2020 WNL: - No evidence of deep vein thrombosis in the lower extremity. No indirect evidence of obstruction proximal to the inguinal ligament. - No cystic structure found in the popliteal fossa.   S/P aortic and mitral valve bioprostheses 12/08/2013   AVR 21 mm, MVR 25 mm-MagnaEase pericardia   S/P AVR (aortic valve replacement) 2015   bioprosthetic (Bartle)   S/P MVR (mitral valve replacement) 2015   bioprosthetic (Bartle)   Sepsis secondary to UTI (HCC) 08/22/2017   Severe aortic valve stenosis 10/01/2013   mild-mod by echo 02/2014   Skin lesion 01/03/2022   Small bowel obstruction, partial 08/2013   reolved without NGT placement.    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   Dr Amy [place]   Syncope 05/28/2018   Thrombocytopenia 11/04/2019   Typical atrial flutter 05/15/2019   Vitamin B12 deficiency 02/28/2015   Start B12 shots 02/2015    Vitamin D  deficiency 10/23/2016   Wounds, multiple    bilateral legs   Past Surgical History:  Procedure Laterality Date   ANTERIOR LAT LUMBAR FUSION Left 07/17/2018   Procedure: Lumbar Two-Three Lumbar Three-Four Anterolateral decompression/interbody fusion with lateral plate fixation/Infuse;  Surgeon: Colon Shove, MD;  Location: [facility] OR;  Service: Neurosurgery;  Laterality: Left;  Lumbar Two-Three Lumbar Three-Four Anterolateral decompression/interbody fusion with lateral plate fixation/Infuse   AORTIC VALVE REPLACEMENT N/A 12/12/2013   Procedure: AORTIC VALVE REPLACEMENT (AVR);  Surgeon: Dorise MARLA Fellers, MD; Service: Open Heart Surgery   BACK SURGERY  2006, 2007   2006 SPACER, 2007 decompression and fusion L4/5   BOWEL RESECTION   04/02/2012   Procedure: SMALL BOWEL RESECTION;  Surgeon: Jina Nephew, MD;  Location: [facility] ORS;  Service: General;  Laterality: N/A;   BRONCHIAL WASHINGS  03/24/2021   Procedure: BRONCHIAL WASHINGS;  Surgeon: Geronimo Amel, MD;  Location: [facility] ENDOSCOPY;  Service: Endoscopy;;   CARDIOVERSION N/A 08/29/2023   Procedure: CARDIOVERSION (CATH LAB);  Surgeon: Pietro Redell RAMAN, MD;  [facility] INVASIVE CV LAB;  Service: Cardiovascular;  Laterality: N/A;   CARPAL TUNNEL RELEASE Bilateral    CATARACT EXTRACTION W/ INTRAOCULAR LENS IMPLANT Left 10/18/2016   CATARACT EXTRACTION W/ INTRAOCULAR LENS IMPLANT Right 12/13/2016   Dr. Rosan   CHOLECYSTECTOMY N/A 01/21/2015   chronic cholecystitis, Jina Nephew, MD   COLON RESECTION  08/25/2011   Procedure: COLON RESECTION LAPAROSCOPIC;  Surgeon: Jina Nephew, MD;  Location: [facility]S;  Service: General;  Laterality: N/A;  Laparoscopic Assisted Low Anterior Resection Diverting Ostomy and  onQ pain pump   COLONOSCOPY  03/2013   1 polyp, rpt 3 yrs Marianne)   COLONOSCOPY  05/2021   diverticulosis and patent colon anastomosis with some edema/narrowing, rpt 5 yrs Marianne)   COLONOSCOPY WITH PROPOFOL  N/A 06/09/2016   patent colo-colonic anastomosis, rpt 5 yrs Marianne)   COLONOSCOPY WITH PROPOFOL  N/A 01/25/2024   TA, fair prep (consider repeat if needed), patent rectal anastomosis with congestion edema and stenosis, mild sigmoid diverticulosis Zane Groom, MD)   CYSTOSCOPY/URETEROSCOPY/HOLMIUM LASER/STENT PLACEMENT Right 07/03/2023   Procedure: CYSTOSCOPY RIGHT RETROGRADE PYEPLGRAM RIGHT URETEROSCOPY/HOLMIUM LASER/STENT PLACEMENT;  Surgeon: Carolee Sherwood JONETTA DOUGLAS, MD;  Location: [facility];  Service: Urology;  Laterality: Right;  1 HR FOR CASE   CYSTOSCOPY/URETEROSCOPY/HOLMIUM LASER/STENT PLACEMENT Right 07/17/2023   Procedure: CYSTOSCOPY/RIGHT URETEROSCOPY/STENT EXCHANGE;  Surgeon: Carolee Sherwood JONETTA DOUGLAS, MD;  Location: [facility];  Service: Urology;  Laterality: Right;   60 MINS FOR CASE   EP IMPLANTABLE DEVICE N/A 06/16/2016   Procedure: Pacemaker Implant;  Surgeon: Will Gladis Norton, MD;  Location: [facility] INVASIVE CV LAB;  Service: Cardiovascular;  Laterality: N/A;   ESOPHAGOGASTRODUODENOSCOPY (EGD) WITH PROPOFOL  N/A 01/25/2024   diffuse gastritis benign biopsy Zane, Groom, MD)   FOOT SURGERY  left foot   hammer toe   ILEOSTOMY  08/25/2011   ILEOSTOMY CLOSURE  04/02/2012   Procedure: ILEOSTOMY TAKEDOWN;  Surgeon: Jina Nephew, MD;  Location: [facility];  Service: General;  Laterality: N/A;   INTRAOPERATIVE TRANSESOPHAGEAL ECHOCARDIOGRAM N/A 12/12/2013   Procedure: INTRAOPERATIVE TRANSESOPHAGEAL ECHOCARDIOGRAM;  Surgeon: Dorise MARLA Fellers, MD;  Location: [facility] OR;  Service: Open Heart Surgery;  Laterality: N/A;   LEFT AND RIGHT HEART CATHETERIZATION WITH CORONARY ANGIOGRAM N/A 11/27/2013   Procedure: LEFT AND RIGHT HEART CATHETERIZATION WITH CORONARY ANGIOGRAM;  Surgeon: [name], MD;  Location: [facility] CATH LAB;  Service: Cardiovascular;  Laterality: N/A;   MITRAL VALVE REPLACEMENT N/A 12/12/2013   Procedure: MITRAL VALVE (MV) REPLACEMENT OR REPAIR;  Surgeon: Dorise MARLA Fellers, MD; Service: Open Heart Surgery   POLYPECTOMY  01/25/2024   Procedure: POLYPECTOMY, INTESTINE;  Surgeon: Charlanne Groom, MD;  Location: [facility];  Service: Gastroenterology;;   TEE WITHOUT CARDIOVERSION N/A 11/29/2013   Procedure: TRANSESOPHAGEAL ECHOCARDIOGRAM (TEE);  Surgeon: Wilbert JONELLE Bihari, MD;  Location: [facility] ENDOSCOPY;  Service: Cardiovascular;  Laterality: N/A;   TEE WITHOUT CARDIOVERSION N/A 06/04/2019   Procedure: TRANSESOPHAGEAL ECHOCARDIOGRAM (TEE);  Surgeon: Balding Jerel, MD;  Location: [facility] ENDOSCOPY;  Service: Cardiovascular;  Laterality: N/A;   TONSILLECTOMY     TRANSESOPHAGEAL ECHOCARDIOGRAM (CATH LAB) N/A 08/29/2023   Procedure: TRANSESOPHAGEAL ECHOCARDIOGRAM;  Surgeon: Pietro Redell RAMAN, MD;  Location: [facility] INVASIVE CV LAB;  Service: Cardiovascular;  Laterality: N/A;   VIDEO  BRONCHOSCOPY N/A 03/24/2021   Procedure: VIDEO BRONCHOSCOPY WITHOUT FLUORO;  Surgeon: Geronimo Amel, MD;  Location: [facility] ENDOS[facility];  Service: Endoscopy;  Laterality: N/A;  SMALL SCOPE,PREMEDICATE WITH NEBULIZED LIDOCAINE     Allergies  Allergen Reactions   Tape Other (See Comments)    THICK TAPES PULLS OFF MY SKIN   Codeine Nausea Only   Prednisone  Nausea Only   Sulfa Antibiotics Nausea Only    Outpatient Encounter Medications as of 04/30/2024  Medication Sig   acetaminophen  (TYLENOL ) 325 MG tablet Take 650 mg by mouth every 4 (four) hours as needed.   acetaminophen  (TYLENOL ) 500 MG tablet Take 2 tablets (1,000 mg total) by mouth in the morning and at bedtime.   albuterol  (VENTOLIN  HFA) 108 (90 Base) MCG/ACT inhaler Inhale 2 puffs into the lungs every 6 (six)  hours as needed for wheezing.   buPROPion  (WELLBUTRIN  SR) 150 MG 12 hr tablet TAKE 1 TABLET(150 MG) BY MOUTH EVERY MORNING   cholecalciferol (VITAMIN D3) 25 MCG (1000 UNIT) tablet Take 1,000 Units by mouth daily.   ELIQUIS  5 MG TABS tablet TAKE 1 TABLET(5 MG) BY MOUTH TWICE DAILY   ferrous sulfate  325 (65 FE) MG EC tablet Take 1 tablet (325 mg total) by mouth daily with breakfast.   Fluticasone -Umeclidin-Vilant (TRELEGY ELLIPTA ) 200-62.5-25 MCG/ACT AEPB Inhale 1 puff into the lungs daily.   furosemide  (LASIX ) 20 MG tablet Take 20 mg by mouth 2 (two) times daily. Give with 40mg  to equal 60mg .  Give one tablet by mouth every 24 hours as needed for weight gain of 3lbs in 24hrs or 5lbs in one week give with 40mg  to equal 60mg  (Patient taking differently: Give one tablet by mouth every 24 hours as needed for weight gain of 3lbs in 24hrs or 5lbs in one week give with 40mg  to equal 60mg )   furosemide  (LASIX ) 20 MG tablet Take 60 mg by mouth daily.   furosemide  (LASIX ) 40 MG tablet TAKE 1 TABLET BY MOUTH EVERY DAY. [place] Pellicane TAKE AN EXTRA TABLET AS NEEDED FOR SWELLING (Patient taking differently: Give one tablet by mouth every 24 hours as needed  for weight gain of 3lbs in 24 hours or 5lb in one week give along with 20mg  tablet.)   gabapentin  (NEURONTIN ) 100 MG capsule Take 100 mg by mouth 2 (two) times daily.   gabapentin  (NEURONTIN ) 300 MG capsule Take 300 mg by mouth at bedtime.   lisinopril  (ZESTRIL ) 5 MG tablet Take 5 mg by mouth daily.   lovastatin  (MEVACOR ) 40 MG tablet TAKE 1 TABLET(40 MG) BY MOUTH AT BEDTIME   metolazone  (ZAROXOLYN ) 2.5 MG tablet Take 2.5 mg by mouth daily.   metoprolol  succinate (TOPROL  XL) 25 MG 24 hr tablet Take 1 tablet (25 mg total) by mouth every evening.   montelukast  (SINGULAIR ) 10 MG tablet TAKE 1 TABLET(10 MG) BY MOUTH AT BEDTIME   Multiple Vitamins-Minerals (PRESERVISION AREDS 2 PO) Take 2 capsules by mouth in the morning.   omeprazole  (PRILOSEC) 20 MG capsule Take 20 mg by mouth daily.   potassium chloride  (KLOR-CON ) 10 MEQ tablet TAKE 2 TABLETS(20 MEQ) BY MOUTH DAILY   Sodium Hypochlorite (ANASEPT) 0.057 % LIQD Apply topically.   traMADol  (ULTRAM ) 50 MG tablet Take 1 tablet (50 mg total) by mouth 2 (two) times daily as needed.   traZODone  (DESYREL ) 50 MG tablet Take 1 tablet (50 mg total) by mouth at bedtime.   Vibegron  (GEMTESA ) 75 MG TABS Take 75 mg by mouth daily.   vitamin B-12 (CYANOCOBALAMIN ) 500 MCG tablet Take 500 mcg by mouth 2 (two) times a week.   ascorbic acid  (VITAMIN C) 500 MG tablet Take 1 tablet (500 mg total) by mouth daily. (Patient not taking: Reported on 04/30/2024)   Biotin 1000 MCG tablet Take 1,000 mcg by mouth daily. (Patient not taking: Reported on 04/30/2024)   Zinc  50 MG CAPS Take 1 capsule (50 mg total) by mouth daily. (Patient not taking: Reported on 04/30/2024)   No facility-administered encounter medications on file as of 04/30/2024.    Review of Systems  Constitutional:  Negative for activity change, appetite change, fatigue and unexpected weight change.  HENT:  Negative for congestion and hearing loss.   Eyes: Negative.   Respiratory:  Negative for cough and  shortness of breath.   Cardiovascular:  Positive for leg swelling. Negative for chest  pain and palpitations.  Gastrointestinal:  Negative for abdominal pain, constipation and diarrhea.  Musculoskeletal:  Positive for arthralgias, gait problem and myalgias.  Skin:  Negative for color change and wound.  Neurological:  Positive for weakness. Negative for dizziness.  Psychiatric/Behavioral:  Negative for agitation, behavioral problems and confusion.     Immunization History  Administered Date(s) Administered    sv, Bivalent, Protein Subunit Rsvpref,pf (Abrysvo) 07/13/2022   Fluad Quad(high Dose 65+) 07/11/2019, 07/14/2020, 07/25/2022   Fluad Trivalent(High Dose 65+) 08/30/2023   H1N1 09/17/2008   Influenza Split 07/18/2012   Influenza Whole 08/10/2005, 06/24/2009, 07/11/2011   Influenza, High Dose Seasonal PF 07/04/2016, 07/04/2017, 07/27/2021   Influenza,inj,Quad PF,6+ Mos 07/09/2018   Influenza-Unspecified 07/24/2013, 06/30/2014, 06/30/2015   Moderna Covid-19 Vaccine Bivalent Booster 18yrs & up 07/02/2021, 07/24/2023   Moderna SARS-COV2 Booster Vaccination 08/14/2020   Moderna Sars-Covid-2 Vaccination 02/26/2021   PFIZER(Purple Top)SARS-COV-2 Vaccination 10/30/2019, 11/20/2019   Pfizer(Comirnaty)Fall Seasonal Vaccine 12 years and older 08/15/2022   Pneumococcal Conjugate-13 06/02/2014   Pneumococcal Polysaccharide-23 08/10/2005, 04/21/2008, 06/25/2013   Respiratory Syncytial Virus Vaccine,Recomb Aduvanted(Arexvy) 07/13/2022   Td 10/11/2005   Tdap 04/02/2024   Zoster Recombinant(Shingrix) 06/25/2018, 08/28/2018   Zoster, Live 09/16/2014   Pertinent  Health Maintenance Due  Topic Date Due   MAMMOGRAM  04/13/2024   INFLUENZA VACCINE  05/10/2024   Colonoscopy  01/24/2029   DEXA SCAN  Completed      12/05/2023    1:42 PM 12/06/2023    1:03 PM 02/05/2024    2:24 PM 03/14/2024    9:38 AM 03/25/2024   10:09 AM  Fall Risk  Falls in the past year? 1 1 1 1 1   Was there an injury with  Fall? 0 0  0 0  Fall Risk Category Calculator 2 2  2 2   Patient at Risk for Falls Due to History of fall(s)    History of fall(s)  Fall risk Follow up Falls evaluation completed       Functional Status Survey:    Vitals:   04/30/24 1214  BP: (!) 102/51  Pulse: 76  Resp: 14  Temp: 97.9 F (36.6 C)  SpO2: 96%  Weight: 160 lb 6.4 oz (72.8 kg)  Height: 5' 4 (1.626 m)   Body mass index is 27.53 kg/m. Physical Exam Constitutional:      General: She is not in acute distress.    Appearance: She is well-developed. She is not diaphoretic.  HENT:     Head: Normocephalic and atraumatic.     Mouth/Throat:     Pharynx: No oropharyngeal exudate.  Eyes:     Conjunctiva/sclera: Conjunctivae normal.     Pupils: Pupils are equal, round, and reactive to light.  Cardiovascular:     Rate and Rhythm: Normal rate and regular rhythm.     Heart sounds: Normal heart sounds.  Pulmonary:     Effort: Pulmonary effort is normal.     Breath sounds: Normal breath sounds.  Abdominal:     General: Bowel sounds are normal.     Palpations: Abdomen is soft.  Musculoskeletal:     Cervical back: Normal range of motion and neck supple.     Right lower leg: Edema (2+) present.     Left lower leg: Edema (2+) present.  Skin:    General: Skin is warm and dry.  Neurological:     Mental Status: She is alert.     Motor: Weakness present.     Gait: Gait abnormal.  Psychiatric:  Mood and Affect: Mood normal.     Labs reviewed: Recent Labs    06/21/23 0729 06/29/23 0800 08/30/23 0429 09/25/23 1145 04/02/24 1050 04/03/24 0418 04/08/24 0037 04/09/24 0419 04/10/24 0423 04/11/24 0000 04/29/24 0000  NA 140   < > 132*   < > 140   < > 133* 133* 135 139 135*  K 4.1 Hemolysis seen...   < > 4.4   < > 3.8   < > 3.8 3.6 3.5 3.3* 4.8  CL 101   < > 97*   < > 93*   < > 95* 92* 95* 94* 96*  CO2 34*   < > 28   < > 36*   < > 32 32 31 40* 31*  GLUCOSE 79   < > 120*   < > 114*   < > 133* 88 90  --   --    BUN 15   < > 26*   < > 31*   < > 39* 33* 28* 27* 97*  CREATININE 0.88   < > 0.84   < > 1.12*   < > 0.88 0.84 0.76 0.9 1.9*  CALCIUM  9.1   < > 8.9   < > 9.3   < > 8.8* 8.9 8.9 8.6* 8.5*  MG  --    < > 2.2  --  2.1  --  2.0  --   --   --   --   PHOS 3.7  --   --   --   --   --   --   --   --   --   --    < > = values in this interval not displayed.   Recent Labs    04/02/24 1050 04/07/24 0401 04/08/24 0037 04/11/24 0000  AST 17 19 18 29   ALT 12 13 13 21   ALKPHOS 74 55 53 115  BILITOT 0.7 0.5 0.7  --   PROT 6.9 6.0* 5.7*  --   ALBUMIN  2.8* 2.4* 2.3* 2.8*   Recent Labs    01/05/24 1110 03/18/24 1615 04/02/24 1050 04/03/24 0418 04/08/24 0037 04/09/24 0419 04/10/24 0423 04/11/24 0000  WBC 8.1   < > 11.5*   < > 8.6 8.4 8.2 6.7  NEUTROABS 6.7  --  10.3*  --   --   --   --  5,668.00  HGB 13.4   < > 12.3   < > 9.7* 10.6* 10.3* 10.4*  HCT 42.5   < > 38.2   < > 30.1* 29.9* 29.7* 33*  MCV 104.4*   < > 111.4*   < > 106.4* 106.8* 104.2*  --   PLT 75*   < > 81*   < > 54* 65* 74* 78*   < > = values in this interval not displayed.   Lab Results  Component Value Date   TSH 1.395 04/02/2024   Lab Results  Component Value Date   HGBA1C 5.5 12/06/2013   Lab Results  Component Value Date   CHOL 152 06/21/2023   HDL 67.70 06/21/2023   LDLCALC 67 06/21/2023   LDLDIRECT 111.0 06/26/2015   TRIG 85.0 06/21/2023   CHOLHDL 2 06/21/2023    Significant Diagnostic Results in last 30 days:  US  THORACENTESIS ASP PLEURAL SPACE W/IMG GUIDE Result Date: 04/06/2024 INDICATION: 142230 Pleural effusion 142230 Shortness of breath. Left-sided pleural effusion. Request for diagnostic and therapeutic thoracentesis. EXAM: ULTRASOUND GUIDED LEFT THORACENTESIS MEDICATIONS: 1% plain lidocaine , 4 mL COMPLICATIONS:  None immediate. PROCEDURE: An ultrasound guided thoracentesis was thoroughly discussed with the patient and questions answered. The benefits, risks, alternatives and complications were also  discussed. The patient understands and wishes to proceed with the procedure. Written consent was obtained. Ultrasound was performed to localize and mark an adequate pocket of fluid in the left chest. The area was then prepped and draped in the normal sterile fashion. 1% Lidocaine  was used for local anesthesia. Under ultrasound guidance a 6 Fr Safe-T-Centesis catheter was introduced. Thoracentesis was performed. The catheter was removed and a dressing applied. FINDINGS: A total of approximately 400 mL of hazy, serosanguineous fluid was removed. Samples were sent to the laboratory as requested by the clinical team. IMPRESSION: Successful ultrasound guided LEFT thoracentesis yielding 400 mL of pleural fluid. Procedure performed by Franky Rusk PA-C and supervised by Dr. Thom Hall Electronically Signed   By: Thom Hall [name]   On: 04/06/2024 15:00   DG Chest Port 1 View Result Date: 04/06/2024 CLINICAL DATA:  [id number] Status post thoracentesis [id number] EXAM: PORTABLE CHEST - 1 VIEW COMPARISON:  04/05/2024 FINDINGS: The apices are excluded despite repeating the exposure. No visualized pneumothorax. Interval decrease in left pleural effusion. Persistent blunting of the right lateral costophrenic angle. Mild coarse interstitial and alveolar opacities bilaterally as before, with some patchy consolidation/atelectasis in the lung bases, improved on the left since prior study. Heart size and mediastinal contours are within normal limits. Post AVR. Stable left subclavian transvenous pacemaker. Sternotomy wires. IMPRESSION: 1. No pneumothorax status post left thoracentesis. 2. Persistent right pleural effusion and bibasilar consolidation/atelectasis. Electronically Signed   By: JONETTA Faes [name]   On: 04/06/2024 12:28   DG CHEST PORT 1 VIEW Result Date: 04/05/2024 CLINICAL DATA:  Pleural effusion EXAM: PORTABLE CHEST 1 VIEW COMPARISON:  04/05/2024 FINDINGS: LEFT-sided pacemaker overlies normal cardiac silhouette. Midline  sternotomy. Moderate LEFT pleural effusion is noted. Interval improvement in RIGHT lower lobe airspace disease seen on comparison exam IMPRESSION: 1. Persistent but improved RIGHT lobe airspace disease. 2. Persistent LEFT lung effusion. Electronically Signed   By: Jackquline Boxer [name]   On: 04/05/2024 14:37   ECHOCARDIOGRAM COMPLETE Result Date: 04/04/2024    ECHOCARDIOGRAM REPORT   Patient Name:   [name] Date of Exam: 04/04/2024 Medical Rec #:  [id number]            Height:       64.0 in Accession #:    [id number]           Weight:       141.1 lb Date of Birth:  03/20/1946            BSA:          1.687 m Patient Age:    24 years             BP:           139/63 mmHg Patient Gender: F                    HR:           70 bpm. Exam Location:  Inpatient Procedure: 2D Echo, Cardiac Doppler and Color Doppler (Both Spectral and Color            Flow Doppler were utilized during procedure). Indications:    R06.02 SOB  History:        Patient has prior history of Echocardiogram examinations, most  recent 08/29/2023. Abnormal ECG and Pacemaker, COPD, Aortic                 Valve Disease and Mitral Valve Disease, Arrythmias:Atrial                 Fibrillation, Signs/Symptoms:Chest Pain, Shortness of Breath,                 Dyspnea, Edema and Syncope; Risk Factors:Hypertension and                 Dyslipidemia. Cancer. PFO. MVR. AVR.                 Aortic Valve: 21 mm Magna Ease valve is present in the aortic                 position. Procedure Date: 12/08/2013.                 Mitral Valve: 25 mm bioprosthetic valve valve is present in the                 mitral position. Procedure Date: 12/08/2013.  Sonographer:    Ellouise Mose RDCS Referring Phys: [phone] JESSICA U [facility]  Sonographer Comments: Technically difficult study due to poor echo windows. Patient could not lie back. IMPRESSIONS  1. Left ventricular ejection fraction, by estimation, is 65 to 70%. The left ventricle has normal function. The left  ventricle has no regional wall motion abnormalities. There is moderate concentric left ventricular hypertrophy. Left ventricular diastolic parameters are indeterminate.  2. Right ventricular systolic function is moderately reduced. The right ventricular size is mildly enlarged. There is severely elevated pulmonary artery systolic pressure. The estimated right ventricular systolic pressure is 61.9 mmHg.  3. Right atrial size was mildly dilated.  4. The mitral valve has been repaired/replaced. No evidence of mitral valve regurgitation. Severe mitral stenosis. The mean mitral valve gradient is 13.0 mmHg. There is a 25 mm bioprosthetic valve present in the mitral position. Procedure Date: 12/08/2013. Echo findings are consistent with stenosis the mitral prosthesis.  5. Large pleural effusion.  6. Tricuspid valve regurgitation is moderate.  7. The aortic valve has been repaired/replaced. Aortic valve regurgitation is not visualized. Aortic valve sclerosis is present, with no evidence of aortic valve stenosis. There is a 21 mm Magna Ease valve present in the aortic position. Procedure Date:  12/08/2013.  8. Aortic dilatation noted. There is mild dilatation of the ascending aorta, measuring 42 mm.  9. The inferior vena cava is dilated in size with >50% respiratory variability, suggesting right atrial pressure of 8 mmHg. FINDINGS  Left Ventricle: Left ventricular ejection fraction, by estimation, is 65 to 70%. The left ventricle has normal function. The left ventricle has no regional wall motion abnormalities. The left ventricular internal cavity size was small. There is moderate  concentric left ventricular hypertrophy. Left ventricular diastolic function could not be evaluated due to mitral valve replacement. Left ventricular diastolic parameters are indeterminate. Right Ventricle: The right ventricular size is mildly enlarged. No increase in right ventricular wall thickness. Right ventricular systolic function is moderately  reduced. There is severely elevated pulmonary artery systolic pressure. The tricuspid regurgitant velocity is 3.67 m/s, and with an assumed right atrial pressure of 8 mmHg, the estimated right ventricular systolic pressure is 61.9 mmHg. Left Atrium: Left atrial size was not well visualized. Right Atrium: Right atrial size was mildly dilated. Pericardium: There is no evidence of pericardial effusion. Mitral Valve: The mitral valve has  been repaired/replaced. No evidence of mitral valve regurgitation. There is a 25 mm bioprosthetic valve present in the mitral position. Procedure Date: 12/08/2013. Echo findings are consistent with stenosis of the mitral prosthesis. Severe mitral valve stenosis. MV peak gradient, 22.5 mmHg. The mean mitral valve gradient is 13.0 mmHg. Tricuspid Valve: The tricuspid valve is normal in structure. Tricuspid valve regurgitation is moderate . No evidence of tricuspid stenosis. Aortic Valve: Acceleration time borderline, likely normal valve function. The aortic valve has been repaired/replaced. Aortic valve regurgitation is not visualized. Aortic valve sclerosis is present, with no evidence of aortic valve stenosis. Aortic valve mean gradient measures 13.0 mmHg. Aortic valve peak gradient measures 24.1 mmHg. Aortic valve area, by VTI measures 1.32 cm. There is a 21 mm Magna Ease valve present in the aortic position. Procedure Date: 12/08/2013. Pulmonic Valve: The pulmonic valve was normal in structure. Pulmonic valve regurgitation is mild. No evidence of pulmonic stenosis. Aorta: The aortic root is normal in size and structure and aortic dilatation noted. There is mild dilatation of the ascending aorta, measuring 42 mm. Venous: The inferior vena cava is dilated in size with greater than 50% respiratory variability, suggesting right atrial pressure of 8 mmHg. IAS/Shunts: The interatrial septum was not well visualized. Additional Comments: There is a large pleural effusion.  LEFT VENTRICLE PLAX 2D  LVIDd:         3.70 cm     Diastology LVIDs:         2.60 cm     LV e' medial:    3.26 cm/s LV PW:         2.10 cm     LV E/e' medial:  33.3 LV IVS:        1.20 cm     LV e' lateral:   4.90 cm/s LVOT diam:     2.10 cm     LV E/e' lateral: 22.2 LV SV:         59 LV SV Index:   35 LVOT Area:     3.46 cm  LV Volumes (MOD) LV vol d, MOD A2C: 64.2 ml LV vol d, MOD A4C: 48.8 ml LV vol s, MOD A2C: 12.4 ml LV vol s, MOD A4C: 19.1 ml LV SV MOD A2C:     51.8 ml LV SV MOD A4C:     48.8 ml LV SV MOD BP:      40.8 ml RIGHT VENTRICLE            IVC RV S prime:     5.11 cm/s  IVC diam: 2.60 cm TAPSE (M-mode): 1.2 cm LEFT ATRIUM             Index        RIGHT ATRIUM           Index LA diam:        3.00 cm 1.78 cm/m   RA Area:     18.30 cm LA Vol (A2C):   59.0 ml 34.98 ml/m  RA Volume:   55.30 ml  32.78 ml/m LA Vol (A4C):   37.4 ml 22.17 ml/m LA Biplane Vol: 47.3 ml 28.04 ml/m  AORTIC VALVE                     PULMONIC VALVE AV Area (Vmax):    1.24 cm      PR End Diast Vel: 1.70 msec AV Area (Vmean):   1.20 cm AV Area (VTI):     1.32 cm  AV Vmax:           245.67 cm/s AV Vmean:          169.667 cm/s AV VTI:            0.443 m AV Peak Grad:      24.1 mmHg AV Mean Grad:      13.0 mmHg LVOT Vmax:         87.90 cm/s LVOT Vmean:        58.900 cm/s LVOT VTI:          0.169 m LVOT/AV VTI ratio: 0.38  AORTA Ao Root diam: 3.20 cm Ao Asc diam:  4.07 cm MITRAL VALVE                TRICUSPID VALVE MV Area (PHT): 2.24 cm     TR Peak grad:   53.9 mmHg MV Area VTI:   0.80 cm     TR Vmax:        367.00 cm/s MV Peak grad:  22.5 mmHg MV Mean grad:  13.0 mmHg    SHUNTS MV Vmax:       2.37 m/s     Systemic VTI:  0.17 m MV Vmean:      168.0 cm/s   Systemic Diam: 2.10 cm MV Decel Time: 338 msec MV E velocity: 108.56 cm/s MV A velocity: 220.00 cm/s MV E/A ratio:  0.49 Morene Brownie Electronically signed by Morene Brownie Signature Date/Time: 04/04/2024/12:26:32 PM    Final    DG ESOPHAGUS W SINGLE CM (SOL OR THIN BA) Result Date:  04/03/2024 CLINICAL DATA:  Dysphagia. Recurrent phlegm in throat, which patient is unable to swallow. EXAM: ESOPHAGUS/BARIUM SWALLOW STUDY TECHNIQUE: Single contrast esophagram was performed using thin liquid barium. Exam was technically limited by patient's frail physical condition, as she was unable to stand erect, lay supine, or roll on fluoroscopy table. Exam was limited to LPO and RPO projections in semi-erect position. FLUOROSCOPY: Radiation Exposure Index (as provided by the fluoroscopic device): 33.1 mGy Kerma COMPARISON:  None Available. FINDINGS: No evidence of esophageal obstruction, constricting mass, or stricture. Elevated left hemidiaphragm noted. No hiatal hernia seen. No gastroesophageal reflux demonstrated on limited exam. IMPRESSION: Technically limited exam. No evidence of esophageal stricture or hiatal hernia. Electronically Signed   By: Norleen DELENA Kil [name]   On: 04/03/2024 15:52   DG Chest 2 View Result Date: 04/02/2024 CLINICAL DATA:  recent PNA and CHF, worsening oxygen requirement, cough, SOB, wheeze, ronchi and large rales, peripheral edema. EXAM: CHEST - 2 VIEW COMPARISON:  03/21/2024. FINDINGS: Re-demonstration of left retrocardiac airspace opacity obscuring the left hemidiaphragm, descending thoracic aorta and blunting the left lateral costophrenic angle, suggesting combination of left lung atelectasis and/or consolidation with pleural effusion. No significant interval change. There are new heterogeneous opacities overlying the right mid lower lung zones without volume loss, compatible with pneumonia. There is trace right pleural effusion, grossly unchanged since the prior study. Redemonstration of right apical pleural cap, unchanged. Redemonstration of chronic interstitial predominance, similar to the prior study. No frank pulmonary edema. Stable cardio-mediastinal silhouette. There is a left sided 2-lead pacemaker. There are surgical staples along the heart border and sternotomy wires,  status post CABG (coronary artery bypass graft). Prosthetic aortic and mitral valves noted. No acute osseous abnormalities. The soft tissues are within normal limits. IMPRESSION: 1. New heterogeneous opacities overlying the right mid lower lung zones, compatible with pneumonia. 2. Stable left retrocardiac airspace opacity, suggesting combination of left lung atelectasis and/or  consolidation with pleural effusion. 3. Stable trace right pleural effusion. Electronically Signed   By: Ree Molt [name]   On: 04/02/2024 12:45   CT Head Wo Contrast Result Date: 04/02/2024 CLINICAL DATA:  Head trauma, minor (Age >= 65y) Head trauma, moderate-severe fall on thinners, sleepy EXAM: CT HEAD WITHOUT CONTRAST TECHNIQUE: Contiguous axial images were obtained from the base of the skull through the vertex without intravenous contrast. RADIATION DOSE REDUCTION: This exam was performed according to the departmental dose-optimization program which includes automated exposure control, adjustment of the mA and/or kV according to patient size and/or use of iterative reconstruction technique. COMPARISON:  CT of the head dated October 09, 2022. FINDINGS: Brain: Chronic lacunar infarct within the left caudate nucleus. Mild periventricular white matter disease. No evidence of hemorrhage, mass, acute cortical infarct or hydrocephalus. Vascular: Moderate calcific atheromatous disease within the carotid siphons and vertebral arteries. Skull: Intact and unremarkable. Sinuses/Orbits: Status post bilateral lens replacement. Paranasal sinuses are clear. Other: Negative. IMPRESSION: 1. No evidence of acute traumatic injury. 2. Stable chronic ischemic small vessel disease. Electronically Signed   By: Evalene Coho [name]   On: 04/02/2024 12:25    Assessment/Plan 1. Other polyneuropathy (Primary) Agreeable to continue unna boots with increase in gabapentin  to 300 mg TID, will monitor for increase in sedation and therapeutic effects.   2.  Venous stasis ulcers of both lower extremities (HCC) -with LE edema, educated on importance to keep edema down with unna boots to control swelling to promote healing.   3. CHF Continue increase in lasix  60 mg daily with metolazone  2.5 mg daily     Jessica K. Caro BODILY [facility] [phone]

## 2024-05-01 ENCOUNTER — Encounter: Payer: Self-pay | Admitting: Student

## 2024-05-01 ENCOUNTER — Telehealth: Payer: Self-pay | Admitting: Oncology

## 2024-05-01 NOTE — Telephone Encounter (Signed)
 Patient is in rehab after being in the hospital. Doesn't know when she will be able to make it in. Will call back to reschedule.

## 2024-05-05 ENCOUNTER — Telehealth: Payer: Self-pay | Admitting: Student in an Organized Health Care Education/Training Program

## 2024-05-05 NOTE — Telephone Encounter (Signed)
 Spoke to patient on phone. Currently at [facility] in [place] and has been there since 07/02. Legs swelling and bandaged, intense pain in b/l LE. Prior wt 144.3 lb at beginning of June. Currently wt fluctuating each day but this morning 166.3 lb. Has been fluctuating but consistently in the 160s. Currently taking lasix  60 mg PO daily, had missed 3 days of it a week ago when it wasn't written for each daily. She has been encouraged to increase PO intake at rehab. Occasional DOE which improves with rest. O2 has been stable in the 90-91 range, was lower this morning (83) and after taking some deep breaths improved to 89. Not on supplemental O2, was on it for a few weeks post discharge in June but after recent thoracentesis had not required it. Currently sleeping in recliner at facility, semi-inclined, mostly 2/2 back pain. She is able to lay back from breathing perspective. Currently PT comes once daily to work with her. She walks with walker assist around the facility. UOP has been variable but no significant change. Was on intermittent metolazone  2.5 mg PO daily at [facility] but no longer on it . Close to discharge otherwise from rehab perspective. Will need med rec and likely double up on lasix  plus possible metolazone  again. Could be recurrent effusion but most likely just worsening HF in context of severe MS of prosthetic valve. Will have our team reach out to the on call doc tomorrow.

## 2024-05-06 ENCOUNTER — Inpatient Hospital Stay

## 2024-05-06 ENCOUNTER — Inpatient Hospital Stay: Admitting: Oncology

## 2024-05-06 ENCOUNTER — Telehealth: Payer: Self-pay | Admitting: Cardiovascular Disease

## 2024-05-06 NOTE — Telephone Encounter (Signed)
 I have asked to be added back in to clinic this Wednesday. When is she leaving rehab?

## 2024-05-06 NOTE — Telephone Encounter (Signed)
 04/29/2024 her creatinine jumped up to 1.9, much higher than her baseline around 0.9.  I guess that is why they cut back on the diuretics. I think she needs a repeat BMET and BNP level, ideally also a chest x-ray.  She has an appointment currently scheduled for 05/30/2024 and unfortunately I do not have any office weeks until then.  Maybe we could schedule her a visit with APP or TOC heart failure clinic after she is discharged from rehab?

## 2024-05-06 NOTE — Telephone Encounter (Signed)
 Called patient back about message. Patient complaining of BLE edema and gaining 13 lbs in the last 3 to 4 weeks. Patient stated she is in rehab facility right now, and her current lasix  dose is not working. Patient stated she wants to get in to see Dr. Francyne as soon as possible. She stated that when she was in the hospital Dr. Francyne told her he would get her in. Will send message to provider and his nurse to advise.

## 2024-05-06 NOTE — Telephone Encounter (Signed)
 Pt c/o swelling: STAT is pt has developed SOB within 24 hours  How much weight have you gained and in what time span?  About 13 lbs in about 3-4 weeks  If swelling, where is the swelling located?  Both legs. Can hardly walk. Increased swelling and pain in right leg.  Are you currently taking a fluid pill?  Yes, but isn't working  Are you currently SOB?  No   Do you have a log of your daily weights (if so, list)?  166.2 lbs today    Have you gained 3 pounds in a day or 5 pounds in a week?   Have you traveled recently? No

## 2024-05-07 ENCOUNTER — Encounter: Payer: Self-pay | Admitting: Cardiovascular Disease

## 2024-05-07 LAB — FUNGUS CULTURE WITH STAIN: Fungal Source: 188243

## 2024-05-07 LAB — FUNGUS CULTURE RESULT

## 2024-05-07 LAB — FUNGAL ORGANISM REFLEX

## 2024-05-09 ENCOUNTER — Ambulatory Visit: Payer: Self-pay | Admitting: Cardiovascular Disease

## 2024-05-09 ENCOUNTER — Ambulatory Visit (HOSPITAL_COMMUNITY)
Admission: RE | Admit: 2024-05-09 | Discharge: 2024-05-09 | Disposition: A | Discharge status: Home / Self Care | Source: Ambulatory Visit | Attending: Cardiovascular Disease | Admitting: Cardiovascular Disease

## 2024-05-09 ENCOUNTER — Encounter: Payer: Self-pay | Admitting: Cardiovascular Disease

## 2024-05-09 ENCOUNTER — Ambulatory Visit: Attending: Cardiovascular Disease | Admitting: Cardiovascular Disease

## 2024-05-09 VITALS — BP 95/54 | HR 70 | Ht 64.0 in | Wt 165.6 lb

## 2024-05-09 DIAGNOSIS — I7121 Aneurysm of the ascending aorta, without rupture: Secondary | ICD-10-CM

## 2024-05-09 DIAGNOSIS — I5033 Acute on chronic diastolic (congestive) heart failure: Secondary | ICD-10-CM | POA: Diagnosis not present

## 2024-05-09 DIAGNOSIS — I495 Sick sinus syndrome: Secondary | ICD-10-CM

## 2024-05-09 DIAGNOSIS — I442 Atrioventricular block, complete: Secondary | ICD-10-CM

## 2024-05-09 DIAGNOSIS — I2721 Secondary pulmonary arterial hypertension: Secondary | ICD-10-CM | POA: Diagnosis not present

## 2024-05-09 DIAGNOSIS — Z950 Presence of cardiac pacemaker: Secondary | ICD-10-CM

## 2024-05-09 DIAGNOSIS — Z953 Presence of xenogenic heart valve: Secondary | ICD-10-CM

## 2024-05-09 DIAGNOSIS — I480 Paroxysmal atrial fibrillation: Secondary | ICD-10-CM

## 2024-05-09 DIAGNOSIS — D6869 Other thrombophilia: Secondary | ICD-10-CM

## 2024-05-09 DIAGNOSIS — N179 Acute kidney failure, unspecified: Secondary | ICD-10-CM | POA: Diagnosis not present

## 2024-05-09 DIAGNOSIS — J449 Chronic obstructive pulmonary disease, unspecified: Secondary | ICD-10-CM

## 2024-05-09 DIAGNOSIS — I3139 Other pericardial effusion (noninflammatory): Secondary | ICD-10-CM | POA: Diagnosis present

## 2024-05-09 DIAGNOSIS — Z79899 Other long term (current) drug therapy: Secondary | ICD-10-CM | POA: Diagnosis not present

## 2024-05-09 DIAGNOSIS — M4810 Ankylosing hyperostosis [Forestier], site unspecified: Secondary | ICD-10-CM

## 2024-05-09 MED ORDER — CEPHALEXIN 250 MG PO CAPS: 250.0000 mg | ORAL_CAPSULE | Freq: Every day | ORAL | 3 refills | Status: DC

## 2024-05-09 NOTE — Patient Instructions (Addendum)
 Medication Instructions:  STOP-Lisinopril   START- Cephalexin  250 mg once daily  MAKE SURE THE PATIENT IS TAKING LASIX  60 MG DAILY MAKE SURE THE PATIENT IS TAKING METOLAZONE  2.5 MG DAILY  *If you need a refill on your cardiac medications before your next appointment, please call your pharmacy*  Lab Work: BMP, BNP, Magnesium   If you have labs (blood work) drawn today and your tests are completely normal, you will receive your results only by: MyChart Message (if you have MyChart) OR A paper copy in the mail If you have any lab test that is abnormal or we need to change your treatment, we will call you to review the results.  Testing/Procedures: chest X-ray- Needed STAT  Follow-Up: [facility] you and your health needs are our priority.  As part of our continuing mission to provide you with exceptional heart care, our providers are all part of one team.  This team includes your primary Cardiologist (physician) and Advanced Practice Providers or APPs (Physician Assistants and Nurse Practitioners) who all work together to provide you with the care you need, when you need it.  Your next appointment:    APP in 6 weeks  Dr Francyne 3-4 months    We recommend signing up for the patient portal called MyChart.  Sign up information is provided on this After Visit Summary.  MyChart is used to connect with patients for Virtual Visits (Telemedicine).  Patients are able to view lab/test results, encounter notes, upcoming appointments, etc.  Non-urgent messages can be sent to your provider as well.   To learn more about what you can do with MyChart, go to [email or web address].

## 2024-05-09 NOTE — Progress Notes (Signed)
 Cardiology office note   Date:  05/09/2024   ID:  Chidera Dearcos, [place] Feb 07, 1946, MRN [id number] PCP:  [name], MD  Cardiologist:  [name], MD  Electrophysiologist:  None   Evaluation Performed:  Follow-Up Visit  Chief Complaint: CHF, AFib  History of Present Illness:    Laketia Kaliana Albino is a 78 y.o. 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  Mrs. Stoltzfus is not doing well.  She was hospitalized with pneumonia/COPD exacerbation in early June and then was rehospitalized with shortness of breath 04/02/2024 with worsening dyspnea.  Her chest x-ray showed a left-sided pleural effusion.  Thoracentesis demonstrated that this was exudative.  She has severe weakness and continue to require oxygen supplementation so she was discharged to a nursing facility.  While there she has steadily gained weight and developed edema.  She now weighs about 20 pounds more than at the time of hospital discharge and 17 pounds more than our previously estimated dry weight.  Despite this she is borderline hypotensive at 95/54 mmHg.  She does not have significant shortness of breath, but is very sedentary.  She always sleeps sitting up because of her spine problems.  She has occasional weeping from the skin of her lower extremities. She reports that she is urinating less than usual.    If records are accurate, she is currently receiving furosemide  60 mg daily plus metolazone   2.5 mg daily.  Her most recent lab from 04/29/2024 showed that her creatinine had actually increased to 1.9.  At hospital discharge 04/11/2024 her creatinine had been completely normal at 0.9.  Her BUN also increased from 27-97.  I do not know if medications were adjusted after those results were obtained.    Pacemaker check today shows normal device function.  The pacing lower rate limit was set at 70 bpm and decreased today to 60 bpm due to her mitral stenosis.  This actually allowed her sinus rhythm to palpation at about 62 bpm.  Although she is not pacemaker dependent, due to the long AV conduction times she has 100% ventricular pacing.  This remained the case even when I try to extend her AV delays to the maximum allowed by the device today.  She has not had any recent atrial fibrillation, at least not since hospital discharge.  The last time she had a meaningful episode of lengthy atrial fibrillation was a 10-day event in April 2025.  During her hospitalization she had an echocardiogram on 04/04/2024 that shows normal left ventricular systolic function with EF 65-70% but a mildly dilated and moderately dysfunctional right ventricle due to severe pulmonary artery hypertension estimated systolic PAP 62 mmHg.  Mitral stenosis was deemed to be severe with a mean transmitral gradient of 13 mmHg at a heart rate of 70 bpm (she has a 25 mm bioprosthetic valve).  Aortic valve function was okay (she has a 21 mm bioprosthetic aortic valve with a mean gradient of 13 mmHg).  Her most recent echocardiogram was the TEE performed in November 2024 before her cardioversion.  LVEF was 60 to 65%, the right ventricle is mildly enlarged but had normal systolic function.  The pulmonary artery is also moderately dilated.  Both atria were moderately dilated.  There was mild-moderate mitral stenosis (25 mm [facility] Ease prosthetic valve with a mean gradient of 7 mmHg at a heart rate of 60 bpm.  This gradient was improved  compared to the studies in January and 12/04/23and back to the baseline from 2015 (when it was 6 mmHg).  Normal function of the aortic valve prosthesis with a mean gradient of 12 mmHg.  At hospital discharge on 04/02/2024 her BNP was 365 (in November 2024 was 293).  As an outpatient, proBNP 02/05/2024 was elevated at 754, compared to 379 in 2021.  Hemoglobin was stable around 10, with macrocytic indices, during her hospitalization.  In August 2020 she had transient worsening mitral stenosis due to leaflet thrombosis, resolved after Xarelto  was switched to warfarin.  She had a lot of problems with easy bleeding and bruising on warfarin, as well as highly erratic INR, rarely kept in normal range.  In February 2022 we switched from warfarin to Eliquis .  She has a remote history of rectal cancer treated with partial colectomy as well as radiation and xeloda .  Underwent recent endoscopy and colonoscopy without any major pathology identified.  Had a bone marrow biopsy due to mild pancytopenia with macrocytosis that did not show any serious pathology, potential early myelodysplasia although she had a negative MDS FISH panel.  Most recent hemoglobin 13.4 with an MCV of 104 and platelet count 75K normal WBC 8.1 with moderate lymphopenia.  She has had repeated episodes of transient partial visual field cut in her left eye, lasting for a few minutes at a time.  Usually its the bottom half of her visual field that she loses.  Initial concern that this may be related to thromboembolic events is felt to likely anymore, since the episodes are repetitive and stereotypical always in the same distribution.  Her previous ophthalmological evaluation also did not show anything wrong.  She had a CT myelogram on May 08 2019.  There was severe left foraminal stenosis at L3-4 as well as moderate left foraminal stenosis at C4-5-6.  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   She had her most recent echocardiogram  10/13/2021.  This continues to show normal left ventricular systolic function and stable aortic valve prosthesis parameters.  The mean mitral gradient is slightly higher at 9.7 mmHg (up from 7 mmHg), but the mitral valve deceleration time is actually little lower at 338 ms (previously 392 ms).  This suggests that the increased gradients may be due to a change in cardiac output/stroke-volume, rather than a true deterioration in mitral valve performance.  She has a very mild ascending aortic aneurysm that has been stable in size, most recently 4.2 cm on 08/16/2021.  She does have scattered aortic atherosclerosis.  On this last scan there appeared to be improvement in the areas of bronchiectasis/groundglass opacities represent MAC infection.    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 Known DISH with thoracic spine fusion and moderate kyphosis, h/o L spine fusion L2-5   Adjustment disorder with depressed mood October 16, 2014   2 yo son died MI Sep 12, 2014 Husband died car accident 03/14/2015  Anemia    Anticoagulant long-term use 06/14/2019   She takes warfarin for afib/flutter with CHADS2VASc score of 4 (age, sex, CHF, HTN), s/p 2 bioprosthetic valve replacements Coumadin  changed to eliquis  2022 per cardiology   Aortic atherosclerosis 03/03/2022   Noted on 01/2022 chest CT   Arthritis    Arthropathy of spinal facet joint 03/08/2018   Ascending aortic aneurysm 03/02/2021   4.1cm on CT 01/2021 rec yearly monitoring   Bilateral hip pain 09/08/2016   Cancer (HCC)    conon cancer 2012   CAP (community acquired pneumonia) 02/08/2016   Carotid stenosis 12/08/2020   Carotid US  - 40-59% BICA stenosis (2015) Carotid US  12/2020 - 1-39% BICA stenosis, incidental 3.4cm structure behind L  common carotid artery rec further imaging    Cataracts, bilateral    immature   Chest pain 08/25/2014   myoview  low risk 02/2018 (Camitz)   Chronic cholecystitis with calculus 01/21/2015   S/p cholecystectomy    Chronic diastolic heart failure 09/19/2014   Chronic insomnia    Chronic lower back pain 03/05/2008   Returned to Dr Colon - translaminar ESI at L3/4. Myelogram showed significant left sided foraminal stenosis at L2/3 and L3/4 in setting of solid arthrodesis, planned DEXA and if stable osteopenia, considering anterolateral decompression with spacer.    CKD (chronic kidney disease) stage 2, GFR 60-89 ml/min 07/03/2015   Constipation    takes Miralax  daily as needed   COPD (chronic obstructive pulmonary disease)    Albuterol  inhaler daily as needed;Duoneb daily as needed;Spiriva  daily   COVID-19 virus infection 11/11/2022   DDD (degenerative disc disease)    cervical - kyphosis with mod DD changes C5/6 and C6/7; lumbar - early DD at L2/3 (Elsner)   Depression    DISH (diffuse idiopathic skeletal hyperostosis) 05/14/2019   By MRI noted fusion of lower thoracic vertebrae - likely contributing to unsteadiness (Elsner)   Dyspnea on exertion 06/05/2015   myoview  low risk 02/2018   Edema of both lower legs due to peripheral venous insufficiency    Essential hypertension, benign 03/24/2009   hx of not on nmeds currenlty and has been a while since on meds per pt   Heart murmur    Hematuria 06/17/2021   History of kidney stones    History of radiation therapy 05/30/11 to 07/07/11   rectum   History of rectal cancer 05/26/2011   Rectal cancer 2012, midrectum [id number], s/p neoadj chemo&XRT, lap LAR with diverting loop ileostomy 08/2011, ileostomy takedown 03/2012 Discharged from oncology clinic 2017 Marquita)   History of shingles    History of vertebral fracture 11/26/2019   Old L1 vertebral compression fracture incidentally noted on imaging 2020    Hyperlipemia    takes Lovastatin   daily   Lacunar infarction    Left caudate   Left hip pain 07/29/2020   Legally blind in left eye    Lymphangioma 01/07/2021   Noted incidentally on carotid US  12/2020 CT neck - Likely benign lymphangioma 01/2021   Macrocytosis 03/27/2022   Folate and b12 checked 2023   Malaise and fatigue 01/22/2018   Meralgia paresthetica 12/13/2007   Mixed stress and urge urinary incontinence 03/16/2015   Failed oxybutynin  IR/ER. Vesicare  initially helped but then no longer helpful.  myrbetriq  was not effective.  Saw urology (MacDiarmid) - UDS largely overactive bladder with mild-mod stress incontinence. Improved on abx course - maintained on macrodantin  and toviaz .    Obesity    Osteopenia 12/2014   T -1.5 hip  Other spondylosis with radiculopathy, lumbar region 07/17/2018   Pain of right sacroiliac joint 08/09/2017   Pancreatitis 11/2014   ?zpack related vs gallstone pancreatitis with abnormal HIDA scan pending cholecystectomy   Peripheral neuropathy 01/03/2022   Persistent atrial fibrillation 12/09/2020   Personal history of colonic polyps    PONV (postoperative nausea and vomiting)    Positive occult stool blood test    Presence of permanent cardiac pacemaker    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   TEE showed this 05/2019 plan warfarin and recheck in 3 months (Nayra Coury)   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   Pulmonary infiltrates 04/23/2019   06/13/2018-CT super D chest without contrast- generally stable chronic lung disease with pleural parenchymal scarring and scattered nodularity most of these nodules have been tree-in-bud in appearance likely postinfectious or inflammatory the  dominant right lower lobe nodule seen on most recent study has resolved no new or enlarging nodules  04/22/2019-CT chest without contrast- waxing and waning per   Pulmonary nodule 02/23/2012   RLL nodule-6mm stable 2006, April 2009, and June 2009 Small pulm nodules Jan 2012 - > Oct 2012 without change   Rheumatic heart disease mitral stenosis    mod MS by echo 02/2014   Right leg swelling 06/11/2020   R venous US  06/2020 WNL: - No evidence of deep vein thrombosis in the lower extremity. No indirect evidence of obstruction proximal to the inguinal ligament. - No cystic structure found in the popliteal fossa.   S/P aortic and mitral valve bioprostheses 12/08/2013   AVR 21 mm, MVR 25 mm-MagnaEase pericardia   S/P AVR (aortic valve replacement) 2015   bioprosthetic (Bartle)   S/P MVR (mitral valve replacement) 2015   bioprosthetic (Bartle)   Sepsis secondary to UTI (HCC) 08/22/2017   Severe aortic valve stenosis 10/01/2013   mild-mod by echo 02/2014   Skin lesion 01/03/2022   Small bowel obstruction, partial 08/2013   reolved without NGT placement.    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   Dr Amy [place]   Syncope 05/28/2018   Thrombocytopenia 11/04/2019   Typical atrial flutter 05/15/2019   Vitamin B12 deficiency 02/28/2015   Start B12 shots 02/2015    Vitamin D  deficiency 10/23/2016   Wounds, multiple    bilateral legs   Past Surgical History:  Procedure Laterality Date   ANTERIOR LAT LUMBAR FUSION Left 07/17/2018   Procedure: Lumbar Two-Three Lumbar Three-Four Anterolateral decompression/interbody fusion with lateral plate fixation/Infuse;  Surgeon: Colon Shove, MD;  Location: [facility] OR;  Service: Neurosurgery;  Laterality: Left;  Lumbar Two-Three Lumbar Three-Four Anterolateral decompression/interbody fusion with lateral plate fixation/Infuse   AORTIC VALVE REPLACEMENT N/A 12/12/2013   Procedure: AORTIC VALVE REPLACEMENT (AVR);  Surgeon: Dorise MARLA Fellers, MD; Service:  Open Heart Surgery   BACK SURGERY  2006, 2007   2006 SPACER, 2007 decompression and fusion L4/5   BOWEL RESECTION  04/02/2012   Procedure: SMALL BOWEL RESECTION;  Surgeon: Jina Nephew, MD;  Location: [facility] ORS;  Service: General;  Laterality: N/A;   BRONCHIAL WASHINGS  03/24/2021   Procedure: BRONCHIAL WASHINGS;  Surgeon: Geronimo Amel, MD;  Location: [facility] ENDOSCOPY;  Service: Endoscopy;;   CARDIOVERSION N/A 08/29/2023   Procedure: CARDIOVERSION (CATH LAB);  Surgeon: Pietro Rogue  S, MD;  Location: [facility] INVASIVE CV LAB;  Service: Cardiovascular;  Laterality: N/A;   CARPAL TUNNEL RELEASE Bilateral    CATARACT EXTRACTION W/ INTRAOCULAR LENS IMPLANT Left 10/18/2016   CATARACT EXTRACTION W/ INTRAOCULAR LENS IMPLANT Right 12/13/2016   Dr. Rosan   CHOLECYSTECTOMY N/A 01/21/2015   chronic cholecystitis, Jina Nephew, MD   COLON RESECTION  08/25/2011   Procedure: COLON RESECTION LAPAROSCOPIC;  Surgeon: Jina Nephew, MD;  Location: [facility];  Service: General;  Laterality: N/A;  Laparoscopic Assisted Low Anterior Resection Diverting Ostomy and onQ pain pump   COLONOSCOPY  03/2013   1 polyp, rpt 3 yrs Marianne)   COLONOSCOPY  05/2021   diverticulosis and patent colon anastomosis with some edema/narrowing, rpt 5 yrs Marianne)   COLONOSCOPY WITH PROPOFOL  N/A 06/09/2016   patent colo-colonic anastomosis, rpt 5 yrs Marianne)   COLONOSCOPY WITH PROPOFOL  N/A 01/25/2024   TA, fair prep (consider repeat if needed), patent rectal anastomosis with congestion edema and stenosis, mild sigmoid diverticulosis Zane Groom, MD)   CYSTOSCOPY/URETEROSCOPY/HOLMIUM LASER/STENT PLACEMENT Right 07/03/2023   Procedure: CYSTOSCOPY RIGHT RETROGRADE PYEPLGRAM RIGHT URETEROSCOPY/HOLMIUM LASER/STENT PLACEMENT;  Surgeon: Carolee Sherwood JONETTA DOUGLAS, MD;  Location: [facility];  Service: Urology;  Laterality: Right;  1 HR FOR CASE   CYSTOSCOPY/URETEROSCOPY/HOLMIUM LASER/STENT PLACEMENT Right 07/17/2023   Procedure: CYSTOSCOPY/RIGHT  URETEROSCOPY/STENT EXCHANGE;  Surgeon: Carolee Sherwood JONETTA DOUGLAS, MD;  Location: [facility];  Service: Urology;  Laterality: Right;  60 MINS FOR CASE   EP IMPLANTABLE DEVICE N/A 06/16/2016   Procedure: Pacemaker Implant;  Surgeon: Will Gladis Norton, MD;  Location: [facility] INVAS[facility] CV LAB;  Service: Cardiovascular;  Laterality: N/A;   ESOPHAGOGASTRODUODENOSCOPY (EGD) WITH PROPOFOL  N/A 01/25/2024   diffuse gastritis benign biopsy Zane, Groom, MD)   FOOT SURGERY  left foot   hammer toe   ILEOSTOMY  08/25/2011   ILEOSTOMY CLOSURE  04/02/2012   Procedure: ILEOSTOMY TAKEDOWN;  Surgeon: Jina Nephew, MD;  Location: [facility];  Service: General;  Laterality: N/A;   INTRAOPERATIVE TRANSESOPHAGEAL ECHOCARDIOGRAM N/A 12/12/2013   Procedure: INTRAOPERATIVE TRANSESOPHAGEAL ECHOCARDIOGRAM;  Surgeon: Dorise MARLA Fellers, MD;  Location: [facility] OR;  Service: Open Heart Surgery;  Laterality: N/A;   LEFT AND RIGHT HEART CATHETERIZATION WITH CORONARY ANGIOGRAM N/A 11/27/2013   Procedure: LEFT AND RIGHT HEART CATHETERIZATION WITH CORONARY ANGIOGRAM;  Surgeon: [name], MD;  Location: [facility] CATH LAB;  Service: Cardiovascular;  Laterality: N/A;   MITRAL VALVE REPLACEMENT N/A 12/12/2013   Procedure: MITRAL VALVE (MV) REPLACEMENT OR REPAIR;  Surgeon: Dorise MARLA Fellers, MD; Service: Open Heart Surgery   POLYPECTOMY  01/25/2024   Procedure: POLYPECTOMY, INTESTINE;  Surgeon: Charlanne Groom, MD;  Location: [facility];  Service: Gastroenterology;;   TEE WITHOUT CARDIOVERSION N/A 11/29/2013   Procedure: TRANSESOPHAGEAL ECHOCARDIOGRAM (TEE);  Surgeon: Wilbert JONELLE Bihari, MD;  [facility] ENDOSCOPY;  Service: Cardiovascular;  Laterality: N/A;   TEE WITHOUT CARDIOVERSION N/A 06/04/2019   Procedure: TRANSESOPHAGEAL ECHOCARDIOGRAM (TEE);  Surgeon: Balding Jerel, MD;  Location: [facility] ENDOSCOPY;  Service: Cardiovascular;  Laterality: N/A;   TONSILLECTOMY     TRANSESOPHAGEAL ECHOCARDIOGRAM (CATH LAB) N/A 08/29/2023   Procedure: TRANSESOPHAGEAL  ECHOCARDIOGRAM;  Surgeon: Pietro Redell RAMAN, MD;  Location[facility] INVASIVE CV LAB;  Service: Cardiovascular;  Laterality: N/A;   VIDEO BRONCHOSCOPY N/A 03/24/2021   Procedure: VIDEO BRONCHOSCOPY WITHOUT FLUORO;  Surgeon: Geronimo Amel, MD;  Location: [facility] ENDOSCOPY;  Service: Endoscopy;  Laterality: N/A;  SMALL SCOPE,PREMEDICATE WITH NEBULIZED LIDOCAINE      Current Meds  Medication Sig   cephALEXin  (KEFLEX ) 250 MG  capsule Take 1 capsule (250 mg total) by mouth daily.   furosemide  (LASIX ) 20 MG tablet Take 60 mg by mouth daily.   metoprolol  succinate (TOPROL  XL) 25 MG 24 hr tablet Take 1 tablet (25 mg total) by mouth every evening.     Allergies:   Tape, Codeine, Prednisone , and Sulfa antibiotics   Social History   Tobacco Use   Smoking status: Former    Current packs/day: 0.00    Average packs/day: 1.5 packs/day for 10.0 years (15.0 ttl pk-yrs)    Types: Cigarettes    Start date: 12/27/1964    Quit date: 12/28/1974    Years since quitting: 49.3   Smokeless tobacco: Never   Tobacco comments:    quit smoking in 1976  Vaping Use   Vaping status: Never Used  Substance Use Topics   Alcohol  use: Never    Alcohol /week: 2.0 standard drinks of alcohol     Types: 2 Glasses of wine per week   Drug use: No     Family Hx: The patient's family history includes Breast cancer (age of onset: 71) in her maternal aunt; CAD (age of onset: 31) in her son; Diabetes in her mother; Heart attack in her maternal grandfather; Heart attack (age of onset: 3) in her son; Heart disease in her mother; Hypertension in her mother; Lung cancer in her father; Stroke in her mother. There is no history of Rectal cancer, Stomach cancer, Esophageal cancer, or Colon cancer.  ROS:   Please see the history of present illness.     All other systems reviewed and are negative.   Prior CV studies:   The following studies were reviewed today:  Echocardiogram 03/26/2021   1. Left ventricular ejection fraction, by  estimation, is 60 to 65%. The  left ventricle has normal function. The left ventricle has no regional  wall motion abnormalities. There is severe left ventricular hypertrophy.   2. Right ventricular systolic function is normal. The right ventricular  size is mildly enlarged.   3. Left atrial size was moderately dilated. No left atrial/left atrial  appendage thrombus was detected.   4. Right atrial size was moderately dilated.   5. The mitral valve has been repaired/replaced. Trivial mitral valve  regurgitation. Mild to moderate mitral stenosis. There is a 25 mm Edwards  MagnaEase bioprosthetic valve present in the mitral position. Procedure  Date: 12/08/13.   6. Tricuspid valve regurgitation is moderate.   7. The aortic valve has been repaired/replaced. Aortic valve  regurgitation is not visualized. There is a 21 mm Magna bioprosthetic  valve present in the aortic position. Procedure Date: 12/08/13.   8. Aortic dilatation noted. There is mild dilatation of the ascending  aorta, measuring 41 mm. There is Moderate (Grade III) plaque involving the  descending aorta.   9. Moderately dilated pulmonary artery.  10. Evidence of atrial level shunting detected by color flow Doppler.  There is a small patent foramen ovale.    Labs/Other Tests and Data Reviewed:   CT angiogram of head and neck 06/18/2021 No evidence of acute brain infarction. Chronic small-vessel ischemic change of the white matter. Old lacunar infarction left caudate head. No intracranial large vessel occlusion or correctable proximal stenosis. Aortic atherosclerosis. Atherosclerotic disease at both carotid artery bifurcations. 30-40% stenosis of the proximal ICA on the right. 30% stenosis of the proximal ICA on the left.  CT chest high-resolution 05/29/2023 On my measurement the ascending aorta measures 44 x 42 mm in diameter. There is aortic atherosclerosis  and three-vessel coronary artery calcification.  As on previous CTs,  high suspicion that the right ventricular pacing lead is in the pericardial space (this would explain positive Underwent recent endoscopy and colonoscopy without any major pathology identified.R waves in lead V1)  EKG: Personally reviewed the tracing from 09/11/2023 which shows atrial sensed (sinus)-ventricular paced rhythm  EKG Interpretation Date/Time:    Ventricular Rate:    PR Interval:    QRS Duration:    QT Interval:    QTC Calculation:   R Axis:      Text Interpretation:          Recent Labs: 02/05/2024 proBNP 754 02/05/2024: Pro B Natriuretic peptide (BNP) 754.0 04/02/2024: B Natriuretic Peptide 365.5; TSH 1.395 04/08/2024: Magnesium  2.0 04/11/2024: ALT 21; Hemoglobin 10.4; Platelets 78 04/29/2024: BUN 97; Creatinine 1.9; Potassium 4.8; Sodium 135   Recent Lipid Panel Lab Results  Component Value Date/Time   CHOL 152 06/21/2023 07:29 AM   TRIG 85.0 06/21/2023 07:29 AM   HDL 67.70 06/21/2023 07:29 AM   CHOLHDL 2 06/21/2023 07:29 AM   LDLCALC 67 06/21/2023 07:29 AM   LDLDIRECT 111.0 06/26/2015 09:28 AM    Wt Readings from Last 3 Encounters:  05/09/24 165 lb 9.6 oz (75.1 kg)  04/30/24 160 lb 6.4 oz (72.8 kg)  04/29/24 160 lb (72.6 kg)     Objective:    Vital Signs:  BP (!) 95/54 (BP Location: Left Arm, Patient Position: Sitting, Cuff Size: Normal)   Pulse 70   Ht 5' 4 (1.626 m)   Wt 165 lb 9.6 oz (75.1 kg)   SpO2 94%   BMI 28.43 kg/m       General: Alert, oriented x3, no distress, healthy left subclavian pacemaker site Head: no evidence of trauma, PERRL, EOMI, no exophtalmos or lid lag, no myxedema, no xanthelasma; normal ears, nose and oropharynx Neck: Elevated jugular venous pulsations approximately 10 cm; brisk carotid pulses without delay and no carotid bruits Chest: Prominent kyphosis.  Diminished breath sounds in the left base and some dullness to percussion, but only involving maybe a quarter of the posterior lung field.  No crackles  heard Cardiovascular: normal position and quality of the apical impulse, regular rhythm, normal first and second heart sounds, 1-2/6 aortic ejection murmur, no diastolic murmurs, rubs or gallops Abdomen: no tenderness or distention, no masses by palpation, no abnormal pulsatility or arterial bruits, normal bowel sounds, no hepatosplenomegaly Extremities: 3+ pitting edema both feet and pretibial area despite compression bandages Neurological: grossly nonfocal Psych: Normal mood and affect    ASSESSMENT & PLAN:    1. Acute on chronic diastolic heart failure (HCC)   2. Medication management   3. AKI (acute kidney injury) (HCC)   4. PAH (pulmonary artery hypertension) (HCC)   5. History of mitral valve replacement with bioprosthetic valve   6. History of aortic valve replacement with bioprosthetic valve   7. Third degree AV block (HCC)   8. SSS (sick sinus syndrome) (HCC)   9. Pacemaker   10. Paroxysmal atrial fibrillation (HCC)   11. Acquired thrombophilia (HCC)   12. Stage 3 severe COPD by GOLD classification (HCC)   13. Aneurysm of ascending aorta without rupture (HCC)   14. DISH (diffuse idiopathic skeletal hyperostosis)       CHF: Severely hypervolemic, at least 17 pounds above her dry weight, but with largely signs of right heart failure rather than left heart failure.  This is very concerning since she is reportedly receiving a moderately high  dose of loop diuretic as well as daily thiazide diuretic and her blood pressure is low.  Recheck labs today.  proBNP was modestly elevated last month.  Previously had decompensation due to mitral bioprosthetic valve thrombosis and increased mitral stenosis.  This may be happening again. AKI: I am very concerned about the marked elevation in BUN and creatinine on labs performed 04/29/2024.  Appears she has continued to receive combined loop diuretic plus thiazide diuretic since then, but her urine output has been mediocre according to the  patient, her blood pressure is low and her weight continues to increase.  I am concerned that she has acute renal failure.  Rechecking labs today.  May require repeat hospitalization. PAH: Moderate to severe pulmonary hypertension and right ventricular dysfunction explain her right heart failure findings.  She does have Gold stage III COPD but also has mitral stenosis.  Might need right heart catheterization to tease out the contribution of these disorders.  Note dilated pulmonary artery on CT chest at 3.9 cm. Prosthetic MV stenosis: Transient, related to thrombotic leaflet restriction.  While on Xarelto  anticoagulation she had increasing gradients across the prosthesis and was switched to warfarin.  TEE in November 2024 showed that the mitral valve gradients returned to baseline at 7 mmHg, comparable to initial implantation in 2015 when the mean mitral valve gradient was 6 mmHg, at heart rate of 60 bpm.  Switched  from warfarin Eliquis  due to problems with maintaining INR in therapeutic range and easy bleeding problems, now with a mean transmitral gradient of 13 mmHg at a heart rate of 70 bpm.  May need to again evaluated with TEE and probably have to go back to warfarin again. S/p AVR: Normal function of the prosthesis, stable gradients.  Understands the need for endocarditis prophylaxis with dental procedure. 3rd deg AV block: Should be considered device dependent.  Unable to document any atrioventricular conduction today even when assess the pacemaker delays as maximum allowed.  Sometimes has an idioventricular rhythm in the 40s. SSS-tachy/brady sd: Normal pacemaker function.  Rate decreased back down to 60 bpm because of the mitral stenosis. Pacemaker: Roughly 1 year of estimated battery longevity.  Will continue remote checks every 3 months until later this year when we will probably have to switch to monthly checks when the estimated battery longevity is 6 months or less. Atrial fibrillation: No recent  significant episodes of atrial fibrillation.  Moderate burden of arrhythmia.  She required a cardioversion in November but then she had another lengthy episode of persistent atrial fibrillation in April 2025 that spontaneously reverted to normal rhythm after about 10 days.  She is not on any antiarrhythmics. Anticoagulation: No active bleeding problems.  Recent hemoglobin 9.7-10.3.  The recent EGD and colonoscopy without serious pathology.  Despite the presence of biological aortic and mitral valve prostheses, we have switched from warfarin to Eliquis  due to difficulty with maintaining INR levels in therapeutic range and bleeding complications.  May have to go back to warfarin for prosthetic mitral valve dysfunction. History of syncope: Has not occurred recently.  History of syncope on higher doses of bisoprolol  due to orthostatic hypotension.  No recurrence on metoprolol . COPD: No recent episodes of exacerbation.  Denies wheezing.  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  Improving on most  recent scan from November 2022.  Last saw Dr. Bubba on 02/14/2022 Recurrent vision loss: No recent events.  Evaluated by neurology and retina specialist.    Since the episodes are repetitive, this makes cardioembolic events virtually impossible.   Ascending aortic aneurysm: CT 03/19/2024 shows unchanged diameter of 4.2 cm similar to August 2024.  I do not think she would be a candidate for redo sternotomy to repair an aortic aneurysm so serial follow-up is really not indicated.    HLP: Does not have known CAD/PAD, but does have some aortic atherosclerosis.  Lipid profile from September 2024 showed HDL of 68 and LDL of 67.  Continue lovastatin . DISH: Limits her activity more than dyspnea does. ASCVD:  Has aortic atherosclerosis. 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  No significant stenosis of the intracranial circulation.   Patient Instructions  Medication Instructions:  STOP-Lisinopril   START- Cephalexin  250 mg once daily  MAKE SURE THE PATIENT IS TAKING LASIX  60 MG DAILY MAKE SURE THE PATIENT IS TAKING METOLAZONE  2.5 MG DAILY  *If you need a refill on your cardiac medications before your next appointment, please call your pharmacy*  Lab Work: BMP, BNP, Magnesium   If you have labs (blood work) drawn today and your tests are completely normal, you will receive your results only by: MyChart Message (if you have MyChart) OR A paper copy in the mail If you have any lab test that is abnormal or we need to change your treatment, we will call you to review the results.  Testing/Procedures: chest X-ray- Needed STAT  Follow-Up: At [facility], you and your health needs are our priority.  As part of our continuing mission to provide you with exceptional heart care, our providers are all part of one team.  This team includes your primary Cardiologist (physician) and Advanced Practice Providers or APPs (Physician Assistants and Nurse Practitioners) who all work together to provide you with the care you need, when you need it.  Your next appointment:    APP in 6 weeks  Dr Francyne 3-4 months    We recommend signing up for the patient portal called MyChart.  Sign up information is provided on this After Visit Summary.  MyChart is used to connect with patients for Virtual Visits (Telemedicine).  Patients are able to view lab/test results, encounter notes, upcoming appointments, etc.  Non-urgent messages can be sent to your provider as well.   To learn more about what  you can do with MyChart, go to [email or web address].         Signed, Jerel Francyne, MD  05/09/2024 3:16 PM    [facility]

## 2024-05-10 ENCOUNTER — Other Ambulatory Visit: Payer: Self-pay

## 2024-05-10 DIAGNOSIS — Z79899 Other long term (current) drug therapy: Secondary | ICD-10-CM

## 2024-05-10 LAB — BASIC METABOLIC PANEL WITH GFR
BUN/Creatinine Ratio: 48 — ABNORMAL HIGH (ref 12–28)
BUN: 87 mg/dL (ref 8–27)
CO2: 25 mmol/L (ref 20–29)
Calcium: 8.9 mg/dL (ref 8.7–10.3)
Chloride: 96 mmol/L (ref 96–106)
Creatinine, Ser: 1.82 mg/dL — ABNORMAL HIGH (ref 0.57–1.00)
Glucose: 81 mg/dL (ref 70–99)
Potassium: 4.6 mmol/L (ref 3.5–5.2)
Sodium: 139 mmol/L (ref 134–144)
eGFR: 28 mL/min/1.73 — ABNORMAL LOW (ref >59)

## 2024-05-10 LAB — MAGNESIUM: Magnesium: 2.2 mg/dL (ref 1.6–2.3)

## 2024-05-10 LAB — BRAIN NATRIURETIC PEPTIDE: BNP: 287.9 pg/mL — ABNORMAL HIGH (ref 0.0–100.0)

## 2024-05-10 NOTE — Progress Notes (Signed)
 Orders for repeat lab draw placed.

## 2024-05-13 ENCOUNTER — Other Ambulatory Visit: Payer: Self-pay | Admitting: *Deleted

## 2024-05-13 DIAGNOSIS — Z79899 Other long term (current) drug therapy: Secondary | ICD-10-CM

## 2024-05-14 ENCOUNTER — Inpatient Hospital Stay (HOSPITAL_COMMUNITY)
Admit: 2024-05-14 | Discharge: 2024-05-14 | Disposition: A | Discharge status: Home / Self Care | Attending: Pulmonary Disease

## 2024-05-14 ENCOUNTER — Other Ambulatory Visit: Payer: Self-pay

## 2024-05-14 ENCOUNTER — Inpatient Hospital Stay
Admission: EM | Admit: 2024-05-14 | Discharge: 2024-06-10 | DRG: 291 | Disposition: E | Source: Skilled Nursing Facility | Attending: Internal Medicine | Admitting: Internal Medicine

## 2024-05-14 ENCOUNTER — Emergency Department

## 2024-05-14 DIAGNOSIS — Z8673 Personal history of transient ischemic attack (TIA), and cerebral infarction without residual deficits: Secondary | ICD-10-CM

## 2024-05-14 DIAGNOSIS — Z833 Family history of diabetes mellitus: Secondary | ICD-10-CM

## 2024-05-14 DIAGNOSIS — I5033 Acute on chronic diastolic (congestive) heart failure: Secondary | ICD-10-CM | POA: Diagnosis present

## 2024-05-14 DIAGNOSIS — J9601 Acute respiratory failure with hypoxia: Principal | ICD-10-CM

## 2024-05-14 DIAGNOSIS — I509 Heart failure, unspecified: Secondary | ICD-10-CM | POA: Diagnosis not present

## 2024-05-14 DIAGNOSIS — Z1152 Encounter for screening for COVID-19: Secondary | ICD-10-CM | POA: Diagnosis not present

## 2024-05-14 DIAGNOSIS — E785 Hyperlipidemia, unspecified: Secondary | ICD-10-CM | POA: Diagnosis present

## 2024-05-14 DIAGNOSIS — Z8616 Personal history of COVID-19: Secondary | ICD-10-CM

## 2024-05-14 DIAGNOSIS — I050 Rheumatic mitral stenosis: Secondary | ICD-10-CM | POA: Diagnosis present

## 2024-05-14 DIAGNOSIS — J449 Chronic obstructive pulmonary disease, unspecified: Secondary | ICD-10-CM | POA: Diagnosis present

## 2024-05-14 DIAGNOSIS — D696 Thrombocytopenia, unspecified: Secondary | ICD-10-CM | POA: Diagnosis present

## 2024-05-14 DIAGNOSIS — D649 Anemia, unspecified: Secondary | ICD-10-CM | POA: Diagnosis not present

## 2024-05-14 DIAGNOSIS — I5082 Biventricular heart failure: Secondary | ICD-10-CM | POA: Diagnosis present

## 2024-05-14 DIAGNOSIS — M8580 Other specified disorders of bone density and structure, unspecified site: Secondary | ICD-10-CM | POA: Diagnosis present

## 2024-05-14 DIAGNOSIS — Z66 Do not resuscitate: Secondary | ICD-10-CM | POA: Diagnosis present

## 2024-05-14 DIAGNOSIS — I130 Hypertensive heart and chronic kidney disease with heart failure and stage 1 through stage 4 chronic kidney disease, or unspecified chronic kidney disease: Principal | ICD-10-CM | POA: Diagnosis present

## 2024-05-14 DIAGNOSIS — G9341 Metabolic encephalopathy: Secondary | ICD-10-CM | POA: Diagnosis present

## 2024-05-14 DIAGNOSIS — J90 Pleural effusion, not elsewhere classified: Secondary | ICD-10-CM | POA: Diagnosis present

## 2024-05-14 DIAGNOSIS — I4819 Other persistent atrial fibrillation: Secondary | ICD-10-CM | POA: Diagnosis present

## 2024-05-14 DIAGNOSIS — N182 Chronic kidney disease, stage 2 (mild): Secondary | ICD-10-CM | POA: Diagnosis present

## 2024-05-14 DIAGNOSIS — Z9221 Personal history of antineoplastic chemotherapy: Secondary | ICD-10-CM

## 2024-05-14 DIAGNOSIS — Z961 Presence of intraocular lens: Secondary | ICD-10-CM | POA: Diagnosis present

## 2024-05-14 DIAGNOSIS — I5031 Acute diastolic (congestive) heart failure: Secondary | ICD-10-CM | POA: Diagnosis not present

## 2024-05-14 DIAGNOSIS — Z888 Allergy status to other drugs, medicaments and biological substances status: Secondary | ICD-10-CM

## 2024-05-14 DIAGNOSIS — I272 Pulmonary hypertension, unspecified: Secondary | ICD-10-CM | POA: Diagnosis not present

## 2024-05-14 DIAGNOSIS — Z882 Allergy status to sulfonamides status: Secondary | ICD-10-CM

## 2024-05-14 DIAGNOSIS — J9621 Acute and chronic respiratory failure with hypoxia: Secondary | ICD-10-CM | POA: Diagnosis present

## 2024-05-14 DIAGNOSIS — J9602 Acute respiratory failure with hypercapnia: Secondary | ICD-10-CM | POA: Diagnosis not present

## 2024-05-14 DIAGNOSIS — E8729 Other acidosis: Secondary | ICD-10-CM | POA: Diagnosis present

## 2024-05-14 DIAGNOSIS — Z85048 Personal history of other malignant neoplasm of rectum, rectosigmoid junction, and anus: Secondary | ICD-10-CM

## 2024-05-14 DIAGNOSIS — I071 Rheumatic tricuspid insufficiency: Secondary | ICD-10-CM | POA: Diagnosis present

## 2024-05-14 DIAGNOSIS — E669 Obesity, unspecified: Secondary | ICD-10-CM | POA: Diagnosis present

## 2024-05-14 DIAGNOSIS — Z801 Family history of malignant neoplasm of trachea, bronchus and lung: Secondary | ICD-10-CM

## 2024-05-14 DIAGNOSIS — J9811 Atelectasis: Secondary | ICD-10-CM | POA: Diagnosis present

## 2024-05-14 DIAGNOSIS — Z8701 Personal history of pneumonia (recurrent): Secondary | ICD-10-CM

## 2024-05-14 DIAGNOSIS — Z860100 Personal history of colon polyps, unspecified: Secondary | ICD-10-CM

## 2024-05-14 DIAGNOSIS — N179 Acute kidney failure, unspecified: Principal | ICD-10-CM | POA: Diagnosis present

## 2024-05-14 DIAGNOSIS — Z86007 Personal history of in-situ neoplasm of skin: Secondary | ICD-10-CM

## 2024-05-14 DIAGNOSIS — I1310 Hypertensive heart and chronic kidney disease without heart failure, with stage 1 through stage 4 chronic kidney disease, or unspecified chronic kidney disease: Secondary | ICD-10-CM | POA: Diagnosis present

## 2024-05-14 DIAGNOSIS — I5021 Acute systolic (congestive) heart failure: Secondary | ICD-10-CM | POA: Diagnosis not present

## 2024-05-14 DIAGNOSIS — Z8781 Personal history of (healed) traumatic fracture: Secondary | ICD-10-CM

## 2024-05-14 DIAGNOSIS — Z952 Presence of prosthetic heart valve: Secondary | ICD-10-CM

## 2024-05-14 DIAGNOSIS — Z515 Encounter for palliative care: Secondary | ICD-10-CM

## 2024-05-14 DIAGNOSIS — I2721 Secondary pulmonary arterial hypertension: Secondary | ICD-10-CM | POA: Diagnosis present

## 2024-05-14 DIAGNOSIS — Z981 Arthrodesis status: Secondary | ICD-10-CM

## 2024-05-14 DIAGNOSIS — I480 Paroxysmal atrial fibrillation: Secondary | ICD-10-CM | POA: Diagnosis not present

## 2024-05-14 DIAGNOSIS — Z9842 Cataract extraction status, left eye: Secondary | ICD-10-CM

## 2024-05-14 DIAGNOSIS — Z923 Personal history of irradiation: Secondary | ICD-10-CM

## 2024-05-14 DIAGNOSIS — Z87442 Personal history of urinary calculi: Secondary | ICD-10-CM

## 2024-05-14 DIAGNOSIS — D631 Anemia in chronic kidney disease: Secondary | ICD-10-CM | POA: Diagnosis present

## 2024-05-14 DIAGNOSIS — Z8619 Personal history of other infectious and parasitic diseases: Secondary | ICD-10-CM

## 2024-05-14 DIAGNOSIS — Z87891 Personal history of nicotine dependence: Secondary | ICD-10-CM

## 2024-05-14 DIAGNOSIS — J9622 Acute and chronic respiratory failure with hypercapnia: Secondary | ICD-10-CM | POA: Diagnosis present

## 2024-05-14 DIAGNOSIS — R54 Age-related physical debility: Secondary | ICD-10-CM | POA: Diagnosis present

## 2024-05-14 DIAGNOSIS — Z634 Disappearance and death of family member: Secondary | ICD-10-CM

## 2024-05-14 DIAGNOSIS — Z950 Presence of cardiac pacemaker: Secondary | ICD-10-CM

## 2024-05-14 DIAGNOSIS — H548 Legal blindness, as defined in USA: Secondary | ICD-10-CM | POA: Diagnosis present

## 2024-05-14 DIAGNOSIS — Z886 Allergy status to analgesic agent status: Secondary | ICD-10-CM

## 2024-05-14 DIAGNOSIS — N3281 Overactive bladder: Secondary | ICD-10-CM | POA: Diagnosis present

## 2024-05-14 DIAGNOSIS — Z86718 Personal history of other venous thrombosis and embolism: Secondary | ICD-10-CM

## 2024-05-14 DIAGNOSIS — Z7901 Long term (current) use of anticoagulants: Secondary | ICD-10-CM | POA: Diagnosis not present

## 2024-05-14 DIAGNOSIS — Z823 Family history of stroke: Secondary | ICD-10-CM

## 2024-05-14 DIAGNOSIS — Z9841 Cataract extraction status, right eye: Secondary | ICD-10-CM

## 2024-05-14 DIAGNOSIS — Z91048 Other nonmedicinal substance allergy status: Secondary | ICD-10-CM

## 2024-05-14 DIAGNOSIS — Z8249 Family history of ischemic heart disease and other diseases of the circulatory system: Secondary | ICD-10-CM

## 2024-05-14 DIAGNOSIS — R7989 Other specified abnormal findings of blood chemistry: Secondary | ICD-10-CM | POA: Diagnosis not present

## 2024-05-14 DIAGNOSIS — Z803 Family history of malignant neoplasm of breast: Secondary | ICD-10-CM

## 2024-05-14 DIAGNOSIS — Z9049 Acquired absence of other specified parts of digestive tract: Secondary | ICD-10-CM

## 2024-05-14 LAB — BLOOD GAS, VENOUS
Acid-Base Excess: 4.7 mmol/L — ABNORMAL HIGH (ref 0.0–2.0)
Bicarbonate: 35.2 mmol/L — ABNORMAL HIGH (ref 20.0–28.0)
FIO2: 0.36 %
O2 Saturation: 85.1 %
Patient temperature: 37
pCO2, Ven: 84 mmHg (ref 44–60)
pH, Ven: 7.23 — ABNORMAL LOW (ref 7.25–7.43)
pO2, Ven: 57 mmHg — ABNORMAL HIGH (ref 32–45)

## 2024-05-14 LAB — CBC WITH DIFFERENTIAL/PLATELET
Abs Immature Granulocytes: 0.06 K/uL (ref 0.00–0.07)
Abs Immature Granulocytes: 0.05 K/uL (ref 0.00–0.07)
Basophils Absolute: 0.1 K/uL (ref 0.0–0.1)
Basophils Absolute: 0 K/uL (ref 0.0–0.1)
Basophils Relative: 1 %
Basophils Relative: 0 %
Eosinophils Absolute: 0.1 K/uL (ref 0.0–0.5)
Eosinophils Absolute: 0 K/uL (ref 0.0–0.5)
Eosinophils Relative: 1 %
Eosinophils Relative: 0 %
HCT: 34.9 % — ABNORMAL LOW (ref 36.0–46.0)
HCT: 35.7 % — ABNORMAL LOW (ref 36.0–46.0)
Hemoglobin: 10.4 g/dL — ABNORMAL LOW (ref 12.0–15.0)
Hemoglobin: 10.8 g/dL — ABNORMAL LOW (ref 12.0–15.0)
Immature Granulocytes: 1 %
Immature Granulocytes: 0 %
Lymphocytes Relative: 3 %
Lymphocytes Relative: 2 %
Lymphs Abs: 0.3 K/uL — ABNORMAL LOW (ref 0.7–4.0)
Lymphs Abs: 0.2 K/uL — ABNORMAL LOW (ref 0.7–4.0)
MCH: 32.1 pg (ref 26.0–34.0)
MCH: 33.2 pg (ref 26.0–34.0)
MCHC: 29.8 g/dL — ABNORMAL LOW (ref 30.0–36.0)
MCHC: 30.3 g/dL (ref 30.0–36.0)
MCV: 107.7 fL — ABNORMAL HIGH (ref 80.0–100.0)
MCV: 109.8 fL — ABNORMAL HIGH (ref 80.0–100.0)
Monocytes Absolute: 0.7 K/uL (ref 0.1–1.0)
Monocytes Absolute: 1.1 K/uL — ABNORMAL HIGH (ref 0.1–1.0)
Monocytes Relative: 7 %
Monocytes Relative: 9 %
Neutro Abs: 9.8 K/uL — ABNORMAL HIGH (ref 1.7–7.7)
Neutro Abs: 10.6 K/uL — ABNORMAL HIGH (ref 1.7–7.7)
Neutrophils Relative %: 87 %
Neutrophils Relative %: 89 %
Platelets: 132 K/uL — ABNORMAL LOW (ref 150–400)
Platelets: 110 K/uL — ABNORMAL LOW (ref 150–400)
RBC: 3.24 MIL/uL — ABNORMAL LOW (ref 3.87–5.11)
RBC: 3.25 MIL/uL — ABNORMAL LOW (ref 3.87–5.11)
RDW: 14.3 % (ref 11.5–15.5)
RDW: 14.4 % (ref 11.5–15.5)
WBC: 11.1 K/uL — ABNORMAL HIGH (ref 4.0–10.5)
WBC: 12 K/uL — ABNORMAL HIGH (ref 4.0–10.5)
nRBC: 0 % (ref 0.0–0.2)
nRBC: 0 % (ref 0.0–0.2)

## 2024-05-14 LAB — LAB REPORT - SCANNED: EGFR: 33

## 2024-05-14 LAB — LACTATE DEHYDROGENASE: LDH: 223 U/L — ABNORMAL HIGH (ref 98–192)

## 2024-05-14 LAB — COMPREHENSIVE METABOLIC PANEL WITH GFR
ALT: 14 U/L (ref 0–44)
AST: 31 U/L (ref 15–41)
Albumin: 2.6 g/dL — ABNORMAL LOW (ref 3.5–5.0)
Alkaline Phosphatase: 85 U/L (ref 38–126)
Anion gap: 9 (ref 5–15)
BUN: 90 mg/dL — ABNORMAL HIGH (ref 8–23)
CO2: 32 mmol/L (ref 22–32)
Calcium: 8.8 mg/dL — ABNORMAL LOW (ref 8.9–10.3)
Chloride: 95 mmol/L — ABNORMAL LOW (ref 98–111)
Creatinine, Ser: 1.64 mg/dL — ABNORMAL HIGH (ref 0.44–1.00)
GFR, Estimated: 32 mL/min — ABNORMAL LOW (ref >60)
Glucose, Bld: 166 mg/dL — ABNORMAL HIGH (ref 70–99)
Potassium: 4 mmol/L (ref 3.5–5.1)
Sodium: 136 mmol/L (ref 135–145)
Total Bilirubin: 0.6 mg/dL (ref 0.0–1.2)
Total Protein: 6.5 g/dL (ref 6.5–8.1)

## 2024-05-14 LAB — PROTIME-INR
INR: 1.3 — ABNORMAL HIGH (ref 0.8–1.2)
Prothrombin Time: 17 s — ABNORMAL HIGH (ref 11.4–15.2)

## 2024-05-14 LAB — MRSA NEXT GEN BY PCR, NASAL: MRSA by PCR Next Gen: NOT DETECTED

## 2024-05-14 LAB — GLUCOSE, CAPILLARY: Glucose-Capillary: 115 mg/dL — ABNORMAL HIGH (ref 70–99)

## 2024-05-14 LAB — RESP PANEL BY RT-PCR (RSV, FLU A&B, COVID)  RVPGX2
Influenza A by PCR: NEGATIVE
Influenza B by PCR: NEGATIVE
Resp Syncytial Virus by PCR: NEGATIVE
SARS Coronavirus 2 by RT PCR: NEGATIVE

## 2024-05-14 LAB — TROPONIN I (HIGH SENSITIVITY)
Troponin I (High Sensitivity): 68 ng/L — ABNORMAL HIGH (ref <18)
Troponin I (High Sensitivity): 78 ng/L — ABNORMAL HIGH (ref <18)

## 2024-05-14 LAB — TECHNOLOGIST SMEAR REVIEW: Plt Morphology: NORMAL

## 2024-05-14 LAB — BRAIN NATRIURETIC PEPTIDE: B Natriuretic Peptide: 631.8 pg/mL — ABNORMAL HIGH (ref 0.0–100.0)

## 2024-05-14 LAB — HEPARIN LEVEL (UNFRACTIONATED): Heparin Unfractionated: >1.1 [IU]/mL — ABNORMAL HIGH (ref 0.30–0.70)

## 2024-05-14 LAB — APTT: aPTT: 107 s — ABNORMAL HIGH (ref 24–36)

## 2024-05-14 LAB — LACTIC ACID, PLASMA
Lactic Acid, Venous: 1 mmol/L (ref 0.5–1.9)
Lactic Acid, Venous: 1.4 mmol/L (ref 0.5–1.9)

## 2024-05-14 LAB — PROCALCITONIN: Procalcitonin: <0.1 ng/mL

## 2024-05-14 MED ORDER — IPRATROPIUM-ALBUTEROL 0.5-2.5 (3) MG/3ML IN SOLN: 3.0000 mL | RESPIRATORY_TRACT | Status: DC | PRN

## 2024-05-14 MED ORDER — SODIUM CHLORIDE 0.9 % IV SOLN: 250.0000 mL | INTRAVENOUS | Status: DC

## 2024-05-14 MED ORDER — POLYETHYLENE GLYCOL 3350 17 G PO PACK: 17.0000 g | PACK | Freq: Every day | ORAL | Status: DC | PRN

## 2024-05-14 MED ORDER — DOCUSATE SODIUM 100 MG PO CAPS: 100.0000 mg | ORAL_CAPSULE | Freq: Two times a day (BID) | ORAL | Status: DC | PRN

## 2024-05-14 MED ORDER — HEPARIN SODIUM (PORCINE) 5000 UNIT/ML IJ SOLN
5000.0000 [IU] | Freq: Three times a day (TID) | INTRAMUSCULAR | Status: DC
  Administered 2024-05-14: 5000 [IU] via SUBCUTANEOUS
  Filled 2024-05-14: qty 1

## 2024-05-14 MED ORDER — SODIUM CHLORIDE 0.9 % IV BOLUS
500.0000 mL | Freq: Once | INTRAVENOUS | Status: AC
  Administered 2024-05-14: 500 mL via INTRAVENOUS

## 2024-05-14 MED ORDER — GERHARDT'S BUTT CREAM
TOPICAL_CREAM | Freq: Three times a day (TID) | CUTANEOUS | Status: DC
  Filled 2024-05-14: qty 60

## 2024-05-14 MED ORDER — FUROSEMIDE 10 MG/ML IJ SOLN
40.0000 mg | Freq: Once | INTRAMUSCULAR | Status: AC
  Administered 2024-05-14: 40 mg via INTRAVENOUS
  Filled 2024-05-14: qty 4

## 2024-05-14 MED ORDER — CHLORHEXIDINE GLUCONATE CLOTH 2 % EX PADS
6.0000 | MEDICATED_PAD | Freq: Every day | CUTANEOUS | Status: DC
  Administered 2024-05-14: 6 via TOPICAL
  Filled 2024-05-14: qty 6

## 2024-05-14 MED ORDER — NOREPINEPHRINE 4 MG/250ML-% IV SOLN
0.0000 ug/min | INTRAVENOUS | Status: DC
  Administered 2024-05-14: 2 ug/min via INTRAVENOUS
  Filled 2024-05-14 (×2): qty 250

## 2024-05-14 MED ORDER — HEPARIN BOLUS VIA INFUSION
3750.0000 [IU] | Freq: Once | INTRAVENOUS | Status: AC
  Administered 2024-05-14: 3750 [IU] via INTRAVENOUS
  Filled 2024-05-14: qty 3750

## 2024-05-14 MED ORDER — HEPARIN (PORCINE) 25000 UT/250ML-% IV SOLN
800.0000 [IU]/h | INTRAVENOUS | Status: DC
  Administered 2024-05-14: 1050 [IU]/h via INTRAVENOUS
  Filled 2024-05-14: qty 250

## 2024-05-14 NOTE — Consult Note (Signed)
 WOC Nurse Consult Note: Reason for Consult: leg ulcers and groin  Wound type: 1.  Full thickness B lower legs likely r/t venous insufficiency  2.  Full thickness R elbow/forearm likely r/t trauma  3.  Intertriginous dermatitis to groin/inner thighs  ICD-10 CM Codes for Irritant Dermatitis  L30.4  - Erythema intertrigo. Also used for abrasion of the hand, chafing of the skin, dermatitis due to sweating and friction, friction dermatitis, friction eczema, and genital/thigh intertrigo.  Pressure Injury POA: NA  Measurement: see nursing flowsheet; scattered small ulcerations to B lower legs; widespread to inner thighs/groin  Wound bed: 1.  inner thighs/groin erythema with scattered partial thickness; likely yeast component 2. Bilateral legs scattered ulcerations with primary dark necrotic tissue  3.  R arm red moist  Drainage (amount, consistency, odor) see nursing flowsheet  Periwound: Dressing procedure/placement/frequency:  Cleanse B lower leg ulcerations with Vashe wound cleanser Soila [phone]) do not rinse and allow to air dry. Apply Xeroform gauze (Lawson [phone]) to wound beds daily, secure with ABD pads and wrap with Kerlix roll gauze beginning right below toes and ending right below knees.  May apply Ace bandage wrapped in same fashion as Kerlix for light compression.  Cleanse R arm skin tears with NS, apply Vaseline gauze (Lawson [phone]) every other day and secure with silicone foam or Kerlix roll gauze whichever is preferred.  SOAK BANDAGE IN NS IF ADHERED TO WOUND BED FOR ATRAUMATIC REMOVAL.  Cleanse underneath pannus with soap and water , dry and apply Interdry [employer] # [phone] Measure and cut length of InterDry to fit in skin folds that have skin breakdown Tuck InterDry fabric into skin folds in a single layer, allow for 2 inches of overhang from skin edges to allow for wicking to occur May remove to bathe; dry area thoroughly and then tuck into affected areas again Do not apply  any creams or ointments when using InterDry DO NOT THROW AWAY FOR 5 DAYS unless soiled with stool DO NOT [facility] product, this will inactivate the silver  in the material  New sheet of Interdry should be applied after 5 days of use if patient continues to have skin breakdown   Cleanse inner groin/thighs with Vashe wound cleanser Soila [phone]) do not rinse and allow to air dry. Apply Gerhardt's Butt Cream to entire area 3 times a day.  May sprinkle over Gerhardt's  with floor stock antifungal powder (Microguard) for extra drying effect.  POC discussed with bedside nurse. WOC team will not follow.  Sophronia GEANNIE Collier, RN assistance with this consult.   Re-consult if further needs arise.   Thank you,    Powell Bar MSN, RN-BC, [employer]

## 2024-05-14 NOTE — Consult Note (Signed)
 PHARMACY - ANTICOAGULATION CONSULT NOTE  Pharmacy Consult for Heparin   Indication: prosthetic heart valves, A. fib  Allergies  Allergen Reactions   Tape Other (See Comments)    THICK TAPES PULLS OFF MY SKIN   Codeine Nausea Only   Prednisone  Nausea Only   Sulfa Antibiotics Nausea Only    Patient Measurements:    Vital Signs: Temp: 98.1 F (36.7 C) (08/05 1041) Temp Source: Oral (08/05 1041) BP: 88/52 (08/05 1804) Pulse Rate: 84 (08/05 1804)  Labs: Recent Labs    05/14/24 1039 05/14/24 1329 05/14/24 1518  HGB 10.4*  --  10.8*  HCT 34.9*  --  35.7*  PLT 132*  --  110*  CREATININE 1.64*  --   --   TROPONINIHS 68* 78*  --     Estimated Creatinine Clearance: 28.1 mL/min (A) (by C-G formula based on SCr of 1.64 mg/dL (H)).   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 Known DISH with thoracic spine fusion and moderate kyphosis, h/o L spine fusion L2-5   Adjustment disorder with depressed mood 2014-11-06   73 yo son died MI 10/10/2014 Husband died car accident 04/11/2015   Anemia    Anticoagulant long-term use 06/14/2019   She takes warfarin for afib/flutter with CHADS2VASc score of 4 (age, sex, CHF, HTN), s/p 2 bioprosthetic valve replacements Coumadin  changed to eliquis  2022 per cardiology   Aortic atherosclerosis 03/03/2022   Noted on 01/2022 chest CT   Arthritis    Arthropathy of spinal facet joint 03/08/2018   Ascending aortic aneurysm 03/02/2021   4.1cm on CT 01/2021 rec yearly monitoring   Bilateral hip pain 09/08/2016   Cancer (HCC)    conon cancer 2012   CAP (community acquired pneumonia) 02/08/2016   Carotid stenosis 12/08/2020   Carotid US  - 40-59% BICA stenosis (2015) Carotid US  12/2020 - 1-39% BICA stenosis, incidental  3.4cm structure behind L common carotid artery rec further imaging    Cataracts, bilateral    immature   Chest pain 08/25/2014   myoview  low risk 02/2018 (Camitz)   Chronic cholecystitis with calculus 01/21/2015   S/p cholecystectomy    Chronic diastolic heart failure 09/19/2014   Chronic insomnia    Chronic lower back pain 03/05/2008   Returned to Dr Colon - translaminar ESI at L3/4. Myelogram showed significant left sided foraminal stenosis at L2/3 and L3/4 in setting of solid arthrodesis, planned DEXA and if stable osteopenia, considering anterolateral decompression with spacer.    CKD (chronic kidney disease) stage 2, GFR 60-89 ml/min 07/03/2015   Constipation    takes Miralax  daily as needed   COPD (chronic obstructive pulmonary disease)    Albuterol  inhaler daily as needed;Duoneb daily as needed;Spiriva  daily   COVID-19 virus infection 11/11/2022   DDD (degenerative disc disease)    cervical - kyphosis with mod DD changes C5/6 and C6/7; lumbar - early DD at L2/3 (Elsner)   Depression    DISH (diffuse idiopathic skeletal hyperostosis) 05/14/2019   By MRI noted fusion of lower thoracic vertebrae - likely contributing to unsteadiness (Elsner)   Dyspnea on exertion 06/05/2015   myoview  low risk 02/2018   Edema of both lower legs due to peripheral venous insufficiency    Essential hypertension, benign 03/24/2009   hx of not on nmeds currenlty and  has been a while since on meds per pt   Heart murmur    Hematuria 06/17/2021   History of kidney stones    History of radiation therapy 05/30/11 to 07/07/11   rectum   History of rectal cancer 05/26/2011   Rectal cancer 2012, midrectum [id number], s/p neoadj chemo&XRT, lap LAR with diverting loop ileostomy 08/2011, ileostomy takedown 03/2012 Discharged from oncology clinic 2017 [place]qu[place])   History of shingles    History of vertebral fracture 11/26/2019   Old L1 vertebral compression fracture incidentally noted on imaging 2020    Hyperlipemia     takes Lovastatin  daily   Lacunar infarction    Left caudate   Left hip pain 07/29/2020   Legally blind in left eye    Lymphangioma 01/07/2021   Noted incidentally on carotid US  12/2020 CT neck - Likely benign lymphangioma 01/2021   Macrocytosis 03/27/2022   Folate and b12 checked 2023   Malaise and fatigue 01/22/2018   Meralgia paresthetica 12/13/2007   Mixed stress and urge urinary incontinence 03/16/2015   Failed oxybutynin  IR/ER. Vesicare  initially helped but then no longer helpful.  myrbetriq  was not effective.  Saw urology (MacDiarmid) - UDS largely overactive bladder with mild-mod stress incontinence. Improved on abx course - maintained on macrodantin  and toviaz .    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   Pulmonary infiltrates 04/23/2019   06/13/2018-CT super D chest without contrast- generally stable chronic lung disease with pleural parenchymal scarring and scattered nodularity most of these nodules have been tree-in-bud in appearance likely postinfectious or  inflammatory the dominant right lower lobe nodule seen on most recent study has resolved no new or enlarging nodules  04/22/2019-CT chest without contrast- waxing and waning per   Pulmonary nodule 02/23/2012   RLL nodule-40mm stable 2006, April 2009, and June 2009 Small pulm nodules Jan 2012 - > Oct 2012 without change   Rheumatic heart disease mitral stenosis    mod MS by echo 02/2014   Right leg swelling 06/11/2020   R venous US  06/2020 WNL: - No evidence of deep vein thrombosis in the lower extremity. No indirect evidence of obstruction proximal to the inguinal ligament. - No cystic structure found in the popliteal fossa.   S/P aortic and mitral valve bioprostheses 12/08/2013   AVR 21 mm, MVR 25 mm-MagnaEase pericardia   S/P AVR (aortic valve replacement) 2015   bioprosthetic (Bartle)   S/P MVR (mitral valve replacement) 2015   bioprosthetic (Bartle)   Sepsis secondary to UTI (HCC) 08/22/2017   Severe aortic valve stenosis 10/01/2013   mild-mod by echo 02/2014   Skin lesion 01/03/2022  Small bowel obstruction, partial 08/2013   reolved without NGT placement.    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   Unknown   albuterol  (VENTOLIN  HFA) 108 (90 Base) MCG/ACT inhaler Inhale 2 puffs into the lungs every 6 (six) hours as needed for wheezing. 18 g 6 Unknown   buPROPion  (WELLBUTRIN  SR) 150 MG 12 hr tablet TAKE 1 TABLET(150 MG) BY MOUTH EVERY MORNING 90 tablet 1 05/14/2024 Morning   cephALEXin  (KEFLEX ) 250 MG capsule Take 1 capsule  (250 mg total) by mouth daily. 90 capsule 3 05/14/2024   cholecalciferol (VITAMIN D3) 25 MCG (1000 UNIT) tablet Take 1,000 Units by mouth daily.   05/14/2024 Morning   ELIQUIS  5 MG TABS tablet TAKE 1 TABLET(5 MG) BY MOUTH TWICE DAILY 180 tablet 1 05/14/2024 Morning   ferrous sulfate  325 (65 FE) MG EC tablet Take 1 tablet (325 mg total) by mouth daily with breakfast.   05/14/2024 Morning   Fluticasone -Umeclidin-Vilant (TRELEGY ELLIPTA ) 200-62.5-25 MCG/ACT AEPB Inhale 1 puff into the lungs daily. 60 each 11 05/14/2024 Morning   furosemide  (LASIX ) 20 MG tablet Take 20 mg by mouth 2 (two) times daily. Give with 40mg  to equal 60mg .  Give one tablet by mouth every 24 hours as needed for weight gain of 3lbs in 24hrs or 5lbs in one week give with 40mg  to equal 60mg  (Patient taking differently: Give one tablet by mouth every 24 hours as needed for weight gain of 3lbs in 24hrs or 5lbs in one week give with 40mg  to equal 60mg )   05/14/2024 Morning   gabapentin  (NEURONTIN ) 300 MG capsule Take 300 mg by mouth at bedtime.   05/13/2024 Evening   lovastatin  (MEVACOR ) 40 MG tablet TAKE 1 TABLET(40 MG) BY MOUTH AT BEDTIME 90 tablet 2 05/13/2024 Evening   metolazone  (ZAROXOLYN ) 2.5 MG tablet Take 2.5 mg by mouth daily.   05/14/2024   metoprolol  succinate (TOPROL  XL) 25 MG 24 hr tablet Take 1 tablet (25 mg total) by mouth every evening. 90 tablet 3 05/13/2024 Evening   montelukast  (SINGULAIR ) 10 MG tablet TAKE 1 TABLET(10 MG) BY MOUTH AT BEDTIME 90 tablet 1 05/13/2024 Evening   Multiple Vitamins-Minerals (PRESERVISION AREDS 2 PO) Take 2 capsules by mouth in the morning.   05/14/2024 Morning   omeprazole  (PRILOSEC) 20 MG capsule Take 20 mg by mouth daily.   05/14/2024 Morning   ondansetron  (ZOFRAN ) 4 MG tablet Take 4 mg by mouth every 6 (six) hours as needed for nausea or vomiting.   Unknown   potassium chloride  (KLOR-CON ) 10 MEQ tablet TAKE 2 TABLETS(20 MEQ) BY MOUTH DAILY 180 tablet 1 05/14/2024 Morning   traMADol  (ULTRAM ) 50 MG tablet Take 1  tablet (50 mg total) by mouth 2 (two) times daily as needed. 60 tablet 0 05/13/2024 Evening   traZODone  (DESYREL ) 50 MG tablet Take 1 tablet (50 mg total) by mouth at bedtime.   05/13/2024 Evening   Vibegron  (GEMTESA ) 75 MG TABS Take 75 mg by mouth daily.   05/14/2024  vitamin B-12 (CYANOCOBALAMIN ) 500 MCG tablet Take 500 mcg by mouth 2 (two) times a week.   05/11/2024   furosemide  (LASIX ) 20 MG tablet Take 60 mg by mouth daily.      Sodium Hypochlorite (ANASEPT) 0.057 % LIQD Apply topically. (Patient not taking: Reported on 05/14/2024)   Not Taking   Zinc  50 MG CAPS Take 1 capsule (50 mg total) by mouth daily. (Patient not taking: No sig reported)  0 Not Taking   Scheduled:   Chlorhexidine  Gluconate Cloth  6 each Topical Daily   Gerhardt's butt cream   Topical TID   Infusions:  PRN: docusate sodium , ipratropium-albuterol , polyethylene glycol Anti-infectives (From admission, onward)    None       Assessment: 78 y.o. female with a hx of chronic diastolic heart failure, rheumatic valvular disease, history of aortic and mitral valve replacement in 2015, COPD, hyperlipidemia, AV block s/p dual-chamber pacemaker, atrial flutter, atrial fibrillation, and mild ascending aortic aneurysm. Pt takes apixaban  PTA. Will use aPTT to monitor heparin . Baseline labs ordered. Hx of some thrombocytopenia. Hgb at baseline. Last dose of heparin  sq 1702.    Goal of Therapy:  Heparin  level 0.3-0.7 units/ml once aPTT and heparin  level correlate.  aPTT 66-102 seconds Monitor platelets by anticoagulation protocol: Yes   Plan:  Give 3750 units bolus x 1 Start heparin  infusion at 1050 units/hr Check aPTT level in 8 hours and daily CBC and heparin  level while on heparin  Continue to monitor H&H and platelets  Cathaleen GORMAN Blanch, PharmD, BCPS 05/14/2024,6:09 PM

## 2024-05-14 NOTE — Consult Note (Signed)
 PHARMACY CONSULT NOTE - ELECTROLYTES  Pharmacy Consult for Electrolyte Monitoring and Replacement   Recent Labs: Potassium  Date Value  05/14/2024 4.0 mmol/L  07/28/2014 3.6 mEq/L   Magnesium  (mg/dL)  Date Value  92/68/7974 2.2   Calcium  (mg/dL)  Date Value  91/94/7974 8.8 (L)  07/28/2014 10.0   Albumin  (g/dL)  Date Value  91/94/7974 2.6 (L)  10/04/2016 4.0  11/15/2012 3.3 (L)   Phosphorus (mg/dL)  Date Value  90/88/7975 3.7   Sodium  Date Value  05/14/2024 136 mmol/L  05/09/2024 139 mmol/L  07/28/2014 143 mEq/L   Estimated Creatinine Clearance: 28.1 mL/min (A) (by C-G formula based on SCr of 1.64 mg/dL (H)).  Assessment  [name] is a 78 y.o. female presenting with acute respiratory failure. PMH significant for CHF, rheumatic valve disease with valve replacement, COPD, AV block s/p pacemaker . Pharmacy has been consulted to monitor and replace electrolytes.  Diet: NPO MIVF: N/A Pertinent medications: Lasix  40 mg IV x 1  Goal of Therapy: Electrolytes within normal limits  Plan:  No electrolyte replacement indicated at this time Follow-up electrolytes with AM labs tomorrow  Thank you for allowing pharmacy to be a part of this patient's care.  Lauramae Kneisley B Marvis Saefong 05/14/2024 1:50 PM

## 2024-05-14 NOTE — ED Notes (Signed)
 Patient placed in non skid socks, fall risk band applied to right wrist and bed alarm on.

## 2024-05-14 NOTE — ED Notes (Signed)
 Patient doesn't want to wear bipap, reports she is uncomfortable. Patient educated on importance but still wanting it removed. Patient also requesting to get up to urinate. Educated that this is not the safest and a purewick was offered. Patient refused and states those don't work. And I've been walking around for 2 days with no problems.

## 2024-05-14 NOTE — Progress Notes (Signed)
 Family updated at bedside.   Patient lethargic, placed back on biapap. Trending soft pressures, MD aware. New orders placed.  Bladder scanned, 320. Recheck at 8 per NP.

## 2024-05-14 NOTE — Consult Note (Signed)
 Consultation Note Date: 05/14/2024 at 1545  Patient Name: [name]  DOB: [birth year]  MRN: [id number]  Age / Sex: 78 y.o., female  PCP: [name], MD Referring Physician: Isaiah Scrivener, MD  HPI/Patient Profile: 78 y.o. 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  Patient is being treated for acute hypoxic and hypercapnic respiratory failure due to acute decompensated HFpEF, left pleural effusion, pulmonary HTN, and COPD without acute exacerbation.  Cardiology consulted.  Consult to IR for evaluation for thoracentesis.   PMT was consulted to support patient and family with goals of care discussions.  Clinical Assessment and Goals of Care: Extensive chart review completed prior to meeting patient including labs, vital signs, imaging, progress notes, orders, and available advanced directive documents from current and previous encounters. I then met with patient and her son Lynwood at bedside to discuss diagnosis prognosis, GOC, EOL wishes, disposition and options.  I introduced Palliative Medicine as specialized medical care for people living with serious illness. It focuses on providing relief from the symptoms and stress of a serious illness. The goal is to improve quality of life for both the patient and the family.   As far as functional and nutritional status, patient was independent and living alone prior to recent stay at [facility] for SNF rehab.  We discussed patient's current illness -acute hypoxia and hypercapnia- and what it means in the larger context of patient's on-going co-morbidities.  This is patient's third inpatient admission last month.    Patient remains somnolent and Lynwood  confirms this is the way she has been most of the time.  Education provided on use of BiPAP to correct hypercapnia but that it oftentimes induces anxiety.  Lynwood shares that his mother started to panic as if she does not believe.  He shares that she can tolerate it for small amounts of time but ultimately does better with symptoms in place.  Reviewed importance of attempting BiPAP to combat hypercapnia.  As per chart review, patient's son MARYLAND is her next of kin decision maker.  Lynwood confirms this.  Lynwood shares he is well versed  in his mother's health history and has been no acute questions or concerns at this time.  Discussed next steps are for evaluation by IR for thoracentesis.  Additionally, continue attempts to lower patient's CO2 is the goal.  He shares appreciation for our discussion.  No change to plan of care at this time.   Lynwood works 2.5 hours away.  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  He is requesting that the text messages sent prior to a phone call so that he can notify his coworkers that the importance of needing to take her phone call.  PMT contact info given for Lynwood to contact with any acute palliative needs.  PMT  will continue to follow and support.  Primary Decision Maker HCPOA  Physical Exam Vitals reviewed.  Constitutional:      General: She is not in acute distress. HENT:     Head: Normocephalic.  Cardiovascular:     Rate and Rhythm: Normal rate.  Pulmonary:     Effort: Pulmonary effort is normal. No tachypnea.  Musculoskeletal:     Comments: Generalized weakness  Skin:    General: Skin is warm and dry.  Psychiatric:        Behavior: Behavior is not agitated.     Palliative Assessment/Data: 50%     Thank you for this consult. Palliative medicine will continue to follow and assist holistically.   Time Total: 75 minutes  Time spent includes: Detailed review of medical records (labs, imaging,  vital signs), medically appropriate exam (mental status, respiratory, cardiac, skin), discussed with treatment team, counseling and educating patient, family and staff, documenting clinical information, medication management and coordination of care.  Signed by: Lamarr Gunner, DNP, FNP-BC Palliative Medicine   Please contact Palliative Medicine Team providers via [facility] for questions and concerns.

## 2024-05-14 NOTE — ED Notes (Signed)
RT at bedside placing patient on bipap

## 2024-05-14 NOTE — Plan of Care (Signed)

## 2024-05-14 NOTE — ED Notes (Signed)
 RT removed bipap and placed patient on 4L [place].

## 2024-05-14 NOTE — ED Triage Notes (Signed)
 EMS states they were called out to [facility] for decreased consciousness; patient's initial O2 sat 82%. Placed on 4L and it increased to 94%. Patient complaining of fatigue and back pain.    20G LFA 108/56 CO2 60 CBG 140

## 2024-05-14 NOTE — H&P (Cosign Needed Addendum)
 NAME:  [name], MRN:  [id number], DOB:  07-Nov-1945, LOS: 0 ADMISSION DATE:  05/14/2024, CONSULTATION DATE:  05/14/24 REFERRING MD:  Dr. Levander, CHIEF COMPLAINT:  Altered Mental Status   Brief Pt Description / Synopsis:  78 y.o. 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 Fib, and COPD admitted with Acute Hypoxic and Hypercapnic Respiratory Failure and Acute Metabolic Encephalopathy due to Acute Decompensated HFpEF (also with concern for RV failure) requiring BiPAP, along with AKI.  History of Present Illness:  [name] is a 78 y.o. 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  Patient is currently altered and unable to contribute to history, therefore history is obtained from chart review and from her son's report at bedside.  It is reported that upon EMS arrival, she was poorly responsive and hypoxic with O2 saturations of 82% of which she was placed on 4L [place].    Of note she recently seen outpatient by her Cardiologist Dr. Francyne on 05/09/24 for hypervolemia with concern for RV failure and poor response to both loop + thiazide diuretics due to AKI and soft blood pressures.   ED Course: Initial Vital Signs: Temperature 98.1 F, pulse 90, RR 20, BP 102/57, SpO2 94% on 4L [place] Significant Labs: glucose 166, BUN 90, Creatinine 1.64, BNP 631, HS Troponin 68, lactic acid 1, procalcitonin <0.1, WBC 11.1, Hgb 10.4, Hct 34.9, platelets 132 VBG: pH  7.23/pCO2 57/pO2 57/Bicarb 35.2 COVID PCR is negative Imaging Chest X-ray>>IMPRESSION: Interval enlargement of left pleural effusion with increased left basilar airspace disease, likely atelectasis. No other significant changes. Medications Administered: 500 cc NS bolus  PCCM asked to admit for further workup and treatment, Cardiology has been consulted for assistance with Acute CHF Exacerbation.  Please see Significant Hospital Events section below for full detailed hospital course.   Pertinent  Medical History   Past Medical History:  Diagnosis Date   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 Known DISH with thoracic spine fusion and moderate kyphosis, h/o L spine fusion L2-5   Adjustment disorder with depressed mood 10/18/14   93 yo son died MI September 21, 2014 Husband died car accident March 23, 2015   Anemia    Anticoagulant long-term use 06/14/2019   She takes warfarin for afib/flutter with CHADS2VASc score of 4 (age, sex, CHF, HTN), s/p 2 bioprosthetic valve replacements Coumadin  changed to eliquis  2022 per cardiology   Aortic atherosclerosis 03/03/2022   Noted on 01/2022 chest CT   Arthritis    Arthropathy of spinal facet joint 03/08/2018   Ascending aortic aneurysm 03/02/2021   4.1cm on CT 01/2021 rec yearly monitoring   Bilateral hip pain 09/08/2016   Cancer (HCC)    conon cancer 2012   CAP (community acquired pneumonia) 02/08/2016   Carotid stenosis 12/08/2020   Carotid US  - 40-59% BICA stenosis (2015) Carotid US  12/2020 - 1-39% BICA stenosis, incidental  3.4cm structure behind L common carotid artery rec further imaging    Cataracts, bilateral    immature   Chest pain 08/25/2014   myoview  low risk 02/2018 ([place]itz)   Chronic cholecystitis with calculus 01/21/2015   S/p cholecystectomy    Chronic  diastolic heart failure 09/19/2014   Chronic insomnia    Chronic lower back pain 03/05/2008   Returned to Dr Colon - translaminar ESI at L3/4. Myelogram showed significant left sided foraminal stenosis at L2/3 and L3/4 in setting of solid arthrodesis, planned DEXA and if stable osteopenia, considering anterolateral decompression with spacer.    CKD (chronic kidney disease) stage 2, GFR 60-89 ml/min 07/03/2015   Constipation    takes Miralax  daily as needed   COPD (chronic obstructive pulmonary disease)    Albuterol  inhaler daily as needed;Duoneb daily as needed;Spiriva  daily   COVID-19 virus infection 11/11/2022   DDD (degenerative disc disease)    cervical - kyphosis with mod DD changes C5/6 and C6/7; lumbar - early DD at L2/3 (Elsner)   Depression    DISH (diffuse idiopathic skeletal hyperostosis) 05/14/2019   By MRI noted fusion of lower thoracic vertebrae - likely contributing to unsteadiness (Elsner)   Dyspnea on exertion 06/05/2015   myoview  low risk 02/2018   Edema of both lower legs due to peripheral venous insufficiency    Essential hypertension, benign 03/24/2009   hx of not on nmeds currenlty and has been a while since on meds per pt   Heart murmur    Hematuria 06/17/2021   History of kidney stones    History of radiation therapy 05/30/11 to 07/07/11   rectum   History of rectal cancer 05/26/2011   Rectal cancer 2012, midrectum ypT3ypN0, s/p neoadj chemo&XRT, lap LAR with diverting loop ileostomy 08/2011, ileostomy takedown 03/2012 Discharged from oncology clinic 2017 Marquita)   History of shingles    History of vertebral fracture 11/26/2019   Old L1 vertebral compression fracture incidentally noted on imaging 2020    Hyperlipemia    takes Lovastatin  daily   Lacunar infarction    Left caudate   Left hip pain 07/29/2020   Legally blind in left eye    Lymphangioma 01/07/2021   Noted incidentally on carotid US  12/2020 CT neck - Likely benign lymphangioma 01/2021    Macrocytosis 03/27/2022   Folate and b12 checked 2023   Malaise and fatigue 01/22/2018   Meralgia paresthetica 12/13/2007   Mixed stress and urge urinary incontinence 03/16/2015   Failed oxybutynin  IR/ER. Vesicare  initially helped but then no longer helpful.  myrbetriq  was not effective.  Saw urology (MacDiarmid) - UDS largely overactive bladder with mild-mod stress incontinence. Improved on abx course - maintained on macrodantin  and toviaz .    Obesity    Osteopenia 12/2014   T -1.5 hip   Other spondylosis with radiculopathy, lumbar region 07/17/2018   Pain of right sacroiliac joint 08/09/2017   Pancreatitis 11/2014   ?zpack related vs gallstone pancreatitis with abnormal HIDA scan pending cholecystectomy   Peripheral neuropathy 01/03/2022   Persistent atrial fibrillation 12/09/2020   Personal history of colonic polyps    PONV (postoperative nausea and vomiting)    Positive occult stool blood test    Presence of permanent cardiac pacemaker    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   TEE showed this 05/2019 plan warfarin and recheck in 3 months (Croitoru)   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   Pulmonary infiltrates 04/23/2019   06/13/2018-CT super D chest without contrast- generally stable chronic lung disease with pleural parenchymal scarring and scattered nodularity most of these nodules have been tree-in-bud in appearance likely postinfectious or inflammatory the dominant right lower lobe nodule seen on most recent study has resolved no new or enlarging nodules  04/22/2019-CT chest without contrast- waxing and waning per   Pulmonary nodule 02/23/2012   RLL nodule-70mm stable 2006, April  2009, and June 2009 Small pulm nodules Jan 2012 - > Oct 2012 without change   Rheumatic heart disease mitral stenosis    mod MS by echo 02/2014   Right leg swelling 06/11/2020   R venous US  06/2020 WNL: - No evidence of deep vein thrombosis in the lower extremity. No indirect evidence of obstruction proximal to the inguinal ligament. - No cystic structure found in the popliteal fossa.   S/P aortic and mitral valve bioprostheses 12/08/2013   AVR 21 mm, MVR 25 mm-MagnaEase pericardia   S/P AVR (aortic valve replacement) 2015   bioprosthetic (Bartle)   S/P MVR (mitral valve replacement) 2015   bioprosthetic (Bartle)   Sepsis secondary to UTI (HCC) 08/22/2017   Severe aortic valve stenosis 10/01/2013   mild-mod by echo 02/2014   Skin lesion 01/03/2022   Small bowel obstruction, partial 08/2013   reolved without NGT placement.    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   Dr Amy [place]   Syncope 05/28/2018   Thrombocytopenia 11/04/2019   Typical atrial flutter 05/15/2019   Vitamin B12 deficiency 02/28/2015   Start B12 shots 02/2015    Vitamin D  deficiency 10/23/2016   Wounds, multiple    bilateral legs     Micro Data:  8/5: COVID/FLU/RSV PCR>> negative 8/5: Respiratory Viral Panel >> 8/5: Blood cultures x2>> 8/5: MRSA PCR>>  Antimicrobials:   Anti-infectives (From admission, onward)    None       Significant Hospital Events: Including procedures, antibiotic start and stop dates in addition to other pertinent events   8/5: Presents to ED with AMS, weakness, and hypoxia. Found to be hypercapnic, placed on BiPAP.  PCCM asked to admit in case she were to need vasopressors with diuresis.  Cardiology consulted.  Son confirms DNR status (most form at bedside), ok with short term intubation.  Prognosis is guarded.  Interim History / Subjective:  As outlined above under Significant Hospital Events section  Objective   Blood pressure (!) 105/58, pulse 82,  temperature 98.1 F (36.7 C), temperature source Oral, resp. rate 16, SpO2 100%.    FiO2 (%):  [35 %] 35 %   Intake/Output Summary (Last 24 hours) at 05/14/2024 1323 Last data filed at 05/14/2024 1253 Gross per 24 hour  Intake 500 ml  Output --  Net 500 ml   There were no vitals filed for this visit.  Examination: General: Acute on chronically ill appearing obese female, sitting in bed, on BiPAP, in NAD HENT: Atraumatic, normocephalic, neck supple, difficult to assess JVD due to body habitus and bipap Lungs: Breath sounds coarse and diminished throughout, BiPAP assisted, even, nonlabored Cardiovascular: Paced rhythm Abdomen: Obese, soft, nontender, nondistended, no guarding or rebound tenderness Extremities: Chronic tropic changes with small ulcers to bilateral lower extremities (not acutely infected, see images below) Neuro: Lethargic, arouses to voice and follows simple commands, no focal deficits  noted, difficult to assess orientation due to BiPAP GU: deferred      Resolved Hospital Problem list     Assessment & Plan:   #Acute Hypoxic & Hypercapnic Respiratory Failure due to ... #Acute Decompensated HFpEF #Left Pleural Effusion #Pulmonary Hypertension #COPD without acute exacerbation  -Supplemental O2 as needed to maintain O2 sats 88 to 92% -BiPAP, wean as tolerated -Follow intermittent Chest X-ray & ABG as needed -Bronchodilators prn  -Diuresis as BP and renal function permits ~ will trial 40 mg IV Lasix  x1 dose on 8/5 given soft BP -Pulmonary toilet as able -Consult IR to evaluate for Thoracentesis  #Acute Decompensated HFpEF, concern for possible RV failure #Mildly elevated troponin, suspect demand ischemia #Paroxsymal Atrial Fibrillation #Aortic and Mitral Valve Replacement with Biological Prosthesis in 2016 #Severe Mitral Valve Prosthesis Stenosis #Moderate Tricuspid Valve Regurgitation  PMHx: 3rd degree AV block s/p dual chamber pacemaker, HLD,  AAA -Echocardiogram 04/04/24: LVEF 65-70%k moderate LVH, indeterminate diastolic parameters, RV systolic function moderately reduced and RV size mildly enlarged, severely elevated Pulmonary Artery systolic pressure (61.9 mm Hg), severe mitral prothesis stenosis, large pleural effusion, moderate Tricuspid valve regurgitation -Continuous cardiac monitoring -Maintain MAP >65 -Cautious IV fluids -Vasopressors as needed to maintain MAP goal -Lactic acid is normalized -Trend HS Troponin until peaked -Repeat Echocardiogram pending -Diuresis as BP and renal function permits ~ will trial 40 mg IV Lasix  x1 dose on 8/5 given soft BP -Cardiology consulted, appreciate input -Will hold home Eliquis  for now given mental status  ~may need to start Heparin  gtt for anticoagulation   #Acute Kidney Injury -Monitor I&O's / urinary output -Follow BMP -Ensure adequate renal perfusion -Avoid nephrotoxic agents as able -Replace electrolytes as indicated ~ Pharmacy following for assistance with electrolyte replacement  #Anemia without s/sx of bleeding #Thrombocytopenia PMHx: Thrombocytopenia noted in recent hospitalizations in June and July -Monitor for S/Sx of bleeding -Trend CBC -Heparin  SQ for VTE Prophylaxis ~ once able to take PO will resume home Eliquis  -Transfuse for Hgb <7 -Will check Smear   #Acute Metabolic Encephalopathy due to CO2 Narcosis  -Treatment of metabolic derangements and hypercapnia as outlined above -Provide supportive care -Promote normal sleep/wake cycle and family presence -Avoid sedating medications as able -Urinalysis and Urine Drug Screen are pending -If mental status fails to improve with BiPAP, will need to obtain CT Head         Pt is critically ill with multiorgan failure. Prognosis is guarded, high risk for further decompensation requiring intubaiton, cardiac arrest, and death.  Given current critical illness superimposed on multiple chronic co-morbidities and  advanced age, overall long term prognosis is poor.  Pt is DNR status.  Depending on clinical course, may need to consult Palliative Care to assist with GOC discussions.   Best Practice (right click and Reselect all SmartList Selections daily)   Diet/type: NPO until mental status improved DVT prophylaxis: prophylactic heparin   GI prophylaxis: N/A Lines: N/A Foley:  N/A Code Status:  DNR Last date of multidisciplinary goals of care discussion [N/A]  8/5: Pt and her son updated at bedside on plan of care.  Labs   CBC: Recent Labs  Lab 05/14/24 1039  WBC 11.1*  NEUTROABS 9.8*  HGB 10.4*  HCT 34.9*  MCV 107.7*  PLT 132*    Basic Metabolic Panel: Recent Labs  Lab 05/09/24 1243 05/14/24 1039  NA 139 136  K 4.6 4.0  CL 96 95*  CO2 25 32  GLUCOSE 81 166*  BUN 87* 90*  CREATININE 1.82*  1.64*  CALCIUM  8.9 8.8*  MG 2.2  --    GFR: Estimated Creatinine Clearance: 28.1 mL/min (A) (by C-G formula based on SCr of 1.64 mg/dL (H)). Recent Labs  Lab 05/14/24 1039  WBC 11.1*  LATICACIDVEN 1.0    Liver Function Tests: Recent Labs  Lab 05/14/24 1039  AST 31  ALT 14  ALKPHOS 85  BILITOT 0.6  PROT 6.5  ALBUMIN  2.6*   No results for input(s): LIPASE, AMYLASE in the last 168 hours. No results for input(s): AMMONIA in the last 168 hours.  ABG    Component Value Date/Time   PHART 7.274 (L) 12/12/2013 2354   PCO2ART 54.0 (H) 12/12/2013 2354   PO2ART 102.0 (H) 12/12/2013 2354   HCO3 35.2 (H) 05/14/2024 1040   TCO2 25 12/13/2013 1641   ACIDBASEDEF 2.0 12/12/2013 2354   O2SAT 85.1 05/14/2024 1040     Coagulation Profile: No results for input(s): INR, PROTIME in the last 168 hours.  Cardiac Enzymes: No results for input(s): CKTOTAL, CKMB, CKMBINDEX, TROPONINI in the last 168 hours.  HbA1C: Hgb A1c MFr Bld  Date/Time Value Ref Range Status  12/06/2013 02:00 PM 5.5 <5.7 % Final    Comment:    (NOTE)                                                                        According to the ADA Clinical Practice Recommendations for 2011, when HbA1c is used as a screening test:  >=6.5%   Diagnostic of Diabetes Mellitus           (if abnormal result is confirmed) 5.7-6.4%   Increased risk of developing Diabetes Mellitus References:Diagnosis and Classification of Diabetes Mellitus,Diabetes Care,2011,34(Suppl 1):S62-S69 and Standards of Medical Care in         Diabetes - 2011,Diabetes Care,2011,34 (Suppl 1):S11-S61.    CBG: No results for input(s): GLUCAP in the last 168 hours.  Review of Systems:   Unable to assess due to AMS and BiPAP   Past Medical History:  She,  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 (12/31/2016), Acute cystitis without hematuria (12/22/2015), Acute right-sided low back pain without sciatica (11/26/2019), Adjustment disorder with depressed mood (10/15/2014), Anemia, Anticoagulant long-term use (06/14/2019), Aortic atherosclerosis (03/03/2022), Arthritis, Arthropathy of spinal facet joint (03/08/2018), Ascending aortic aneurysm (03/02/2021), Bilateral hip pain (09/08/2016), Cancer (HCC), CAP (community acquired pneumonia) (02/08/2016), Carotid stenosis (12/08/2020), Cataracts, bilateral, Chest pain (08/25/2014), Chronic cholecystitis with calculus (01/21/2015), Chronic diastolic heart failure (09/19/2014), Chronic insomnia, Chronic lower back pain (03/05/2008), CKD (chronic kidney disease) stage 2, GFR 60-89 ml/min (07/03/2015), Constipation, COPD (chronic obstructive pulmonary disease), COVID-19 virus infection (11/11/2022), DDD (degenerative disc disease), Depression, DISH (diffuse idiopathic skeletal hyperostosis) (05/14/2019), Dyspnea on exertion (06/05/2015), Edema of both lower legs due to peripheral venous insufficiency, Essential hypertension, benign (03/24/2009), Heart murmur, Hematuria (06/17/2021), History of kidney stones, History of radiation therapy (05/30/11 to 07/07/11), History of  rectal cancer (05/26/2011), History of shingles, History of vertebral fracture (11/26/2019), Hyperlipemia, Lacunar infarction, Left hip pain (07/29/2020), Legally blind in left eye, 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), Macrocytosis (03/27/2022), Malaise and fatigue (01/22/2018), Meralgia paresthetica (12/13/2007), 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), Obesity, Osteopenia (12/2014), Other spondylosis with radiculopathy, lumbar region (07/17/2018), Pain of right  sacroiliac joint (08/09/2017), Pancreatitis (11/2014), Peripheral neuropathy (01/03/2022), Persistent atrial fibrillation (12/09/2020), Personal history of colonic polyps, PONV (postoperative nausea and vomiting), Positive occult stool blood test, Presence of permanent cardiac pacemaker, 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), 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), Pulmonary artery hypertension (06/17/2021), Pulmonary infiltrates (04/23/2019), Pulmonary nodule (02/23/2012), Rheumatic heart disease mitral stenosis, Right leg swelling (06/11/2020), S/P aortic and mitral valve bioprostheses (12/08/2013), S/P AVR (aortic valve replacement) (2015), S/P MVR (mitral valve replacement) (2015), Sepsis secondary to UTI (HCC) (08/22/2017), Severe aortic valve stenosis (10/01/2013), Skin lesion (01/03/2022), Small bowel obstruction, partial (08/2013), 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   Surgical History:   Past Surgical History:  Procedure Laterality Date   ANTERIOR LAT LUMBAR FUSION Left 07/17/2018   Procedure: Lumbar Two-Three Lumbar Three-Four Anterolateral decompression/interbody fusion with lateral plate fixation/Infuse;  Surgeon: Colon Shove, MD;  Location: [facility] OR;  Service: Neurosurgery;  Laterality: Left;  Lumbar  Two-Three Lumbar Three-Four Anterolateral decompression/interbody fusion with lateral plate fixation/Infuse   AORTIC VALVE REPLACEMENT N/A 12/12/2013   Procedure: AORTIC VALVE REPLACEMENT (AVR);  Surgeon: Dorise MARLA Fellers, MD; Service: Open Heart Surgery   BACK SURGERY  2006, 2007   2006 SPACER, 2007 decompression and fusion L4/5   BOWEL RESECTION  04/02/2012   Procedure: SMALL BOWEL RESECTION;  Surgeon: Jina Nephew, MD;  Location: [facility] ORS;  Service: General;  Laterality: N/A;   BRONCHIAL WASHINGS  03/24/2021   Procedure: BRONCHIAL WASHINGS;  Surgeon: Geronimo Amel, MD;  Location: [facility] ENDOSCOPY;  Service: Endoscopy;;   CARDIOVERSION N/A 08/29/2023   Procedure: CARDIOVERSION (CATH LAB);  Surgeon: Pietro Redell RAMAN, MD;  Location: [facility] INVASIVE CV LAB;  Service: Cardiovascular;  Laterality: N/A;   CARPAL TUNNEL RELEASE Bilateral    CATARACT EXTRACTION W/ INTRAOCULAR LENS IMPLANT Left 10/18/2016   CATARACT EXTRACTION W/ INTRAOCULAR LENS IMPLANT Right 12/13/2016   Dr. Rosan   CHOLECYSTECTOMY N/A 01/21/2015   chronic cholecystitis, Jina Nephew, MD   COLON RESECTION  08/25/2011   Procedure: COLON RESECTION LAPAROSCOPIC;  Surgeon: Jina Nephew, MD;  Location: [facility];  Service: General;  Laterality: N/A;  Laparoscopic Assisted Low Anterior Resection Diverting Ostomy and onQ pain pump   COLONOSCOPY  03/2013   1 polyp, rpt 3 yrs Marianne)   COLONOSCOPY  05/2021   diverticulosis and patent colon anastomosis with some edema/narrowing, rpt 5 yrs Marianne)   COLONOSCOPY WITH PROPOFOL  N/A 06/09/2016   patent colo-colonic anastomosis, rpt 5 yrs Marianne)   COLONOSCOPY WITH PROPOFOL  N/A 01/25/2024   TA, fair prep (consider repeat if needed), patent rectal anastomosis with congestion edema and stenosis, mild sigmoid diverticulosis Zane Groom, MD)   CYSTOSCOPY/URETEROSCOPY/HOLMIUM LASER/STENT PLACEMENT Right 07/03/2023   Procedure: CYSTOSCOPY RIGHT RETROGRADE PYEPLGRAM RIGHT URETEROSCOPY/HOLMIUM  LASER/STENT PLACEMENT;  Surgeon: Carolee Sherwood JONETTA DOUGLAS, MD;  Location: [facility];  Service: Urology;  Laterality: Right;  1 HR FOR CASE   CYSTOSCOPY/URETEROSCOPY/HOLMIUM LASER/STENT PLACEMENT Right 07/17/2023   Procedure: CYSTOSCOPY/RIGHT URETEROSCOPY/STENT EXCHANGE;  Surgeon: Carolee Sherwood JONETTA DOUGLAS, MD;  Location: [facility];  Service: Urology;  Laterality: Right;  60 MINS FOR CASE   EP IMPLANTABLE DEVICE N/A 06/16/2016   Procedure: Pacemaker Implant;  Surgeon: Will Gladis Norton, MD;  Location: [facility] INVASIVE CV LAB;  Service: Cardiovascular;  Laterality: N/A;   ESOPHAGOGASTRODUODENOSCOPY (EGD) WITH PROPOFOL  N/A 01/25/2024   diffuse gastritis benign biopsy Zane Groom, MD)   FOOT SURGERY  left foot  hammer toe   ILEOSTOMY  08/25/2011   ILEOSTOMY CLOSURE  04/02/2012   Procedure: ILEOSTOMY TAKEDOWN;  Surgeon: Jina Nephew, MD;  Location: [facility];  Service: General;  Laterality: N/A;   INTRAOPERATIVE TRANSESOPHAGEAL ECHOCARDIOGRAM N/A 12/12/2013   Procedure: INTRAOPERATIVE TRANSESOPHAGEAL ECHOCARDIOGRAM;  Surgeon: Dorise MARLA Fellers, MD;  Location: [facility] OR;  Service: Open Heart Surgery;  Laterality: N/A;   LEFT AND RIGHT HEART CATHETERIZATION WITH CORONARY ANGIOGRAM N/A 11/27/2013   Procedure: LEFT AND RIGHT HEART CATHETERIZATION WITH CORONARY ANGIOGRAM;  Surgeon: [name], MD;  Location: [facility] CATH LAB;  Service: Cardiovascular;  Laterality: N/A;   MITRAL VALVE REPLACEMENT N/A 12/12/2013   Procedure: MITRAL VALVE (MV) REPLACEMENT OR REPAIR;  Surgeon: Dorise MARLA Fellers, MD; Service: Open Heart Surgery   POLYPECTOMY  01/25/2024   Procedure: POLYPECTOMY, INTESTINE;  Surgeon: Charlanne Groom, MD;  Location: [facility];  Service: Gastroenterology;;   TEE WITHOUT CARDIOVERSION N/A 11/29/2013   Procedure: TRANSESOPHAGEAL ECHOCARDIOGRAM (TEE);  Surgeon: Wilbert JONELLE Bihari, MD;  Location: [facility] ENDOSCOPY;  Service: Cardiovascular;  Laterality: N/A;   TEE WITHOUT CARDIOVERSION N/A 06/04/2019   Procedure: TRANSESOPHAGEAL  ECHOCARDIOGRAM (TEE);  Surgeon: Balding Jerel, MD;  Location: [facility] ENDOSCOPY;  Service: Cardiovascular;  Laterality: N/A;   TONSILLECTOMY     TRANSESOPHAGEAL ECHOCARDIOGRAM (CATH LAB) N/A 08/29/2023   Procedure: TRANSESOPHAGEAL ECHOCARDIOGRAM;  Surgeon: Pietro Redell RAMAN, MD;  Location: [facility] INVASIVE CV LAB;  Service: Cardiovascular;  Laterality: N/A;   VIDEO BRONCHOSCOPY N/A 03/24/2021   Procedure: VIDEO BRONCHOSCOPY WITHOUT FLUORO;  Surgeon: Geronimo Amel, MD;  Location: [facility] ENDOSCOPY;  Service: Endoscopy;  Laterality: N/A;  SMALL SCOPE,PREMEDICATE WITH NEBULIZED LIDOCAINE      Social History:   reports that she quit smoking about 49 years ago. Her smoking use included cigarettes. She started smoking about 59 years ago. She has a 15 pack-year smoking history. She has never used smokeless tobacco. She reports that she does not drink alcohol  and does not use drugs.   Family History:  Her family history includes Breast cancer (age of onset: 37) in her maternal aunt; CAD (age of onset: 44) in her son; Diabetes in her mother; Heart attack in her maternal grandfather; Heart attack (age of onset: 21) in her son; Heart disease in her mother; Hypertension in her mother; Lung cancer in her father; Stroke in her mother. There is no history of Rectal cancer, Stomach cancer, Esophageal cancer, or Colon cancer.   Allergies Allergies  Allergen Reactions   Tape Other (See Comments)    THICK TAPES PULLS OFF MY SKIN   Codeine Nausea Only   Prednisone  Nausea Only   Sulfa Antibiotics Nausea Only     Home Medications  Prior to Admission medications   Medication Sig Start Date End Date Taking? Authorizing Provider  cephALEXin  (KEFLEX ) 250 MG capsule Take 1 capsule (250 mg total) by mouth daily. 05/09/24  Yes Croitoru, Mihai, MD  acetaminophen  (TYLENOL ) 325 MG tablet Take 650 mg by mouth every 4 (four) hours as needed.    [provider]  acetaminophen  (TYLENOL ) 500 MG tablet Take 2 tablets (1,000  mg total) by mouth in the morning and at bedtime. 02/05/24   [name], MD  albuterol  (VENTOLIN  HFA) [phone] Base) MCG/ACT inhaler Inhale 2 puffs into the lungs every 6 (six) hours as needed for wheezing. 06/05/23   Geronimo Amel, MD  buPROPion  (WELLBUTRIN  SR) 150 MG 12 hr tablet TAKE 1 TABLET(150 MG) BY MOUTH EVERY MORNING 03/07/24   [name], MD  cholecalciferol (VITAMIN D3) 25  MCG (1000 UNIT) tablet Take 1,000 Units by mouth daily.    [provider]  ELIQUIS  5 MG TABS tablet TAKE 1 TABLET(5 MG) BY MOUTH TWICE DAILY 03/25/24   Croitoru, Jerel, MD  ferrous sulfate  325 (65 FE) MG EC tablet Take 1 tablet (325 mg total) by mouth daily with breakfast. 09/06/23   Cloretta Arley NOVAK, MD  Fluticasone -Umeclidin-Vilant (TRELEGY ELLIPTA ) 200-62.5-25 MCG/ACT AEPB Inhale 1 puff into the lungs daily. 06/05/23   Geronimo Amel, MD  furosemide  (LASIX ) 20 MG tablet Take 20 mg by mouth 2 (two) times daily. Give with 40mg  to equal 60mg .  Give one tablet by mouth every 24 hours as needed for weight gain of 3lbs in 24hrs or 5lbs in one week give with 40mg  to equal 60mg  Patient taking differently: Give one tablet by mouth every 24 hours as needed for weight gain of 3lbs in 24hrs or 5lbs in one week give with 40mg  to equal 60mg     [provider]  furosemide  (LASIX ) 20 MG tablet Take 60 mg by mouth daily.    [provider]  gabapentin  (NEURONTIN ) 100 MG capsule Take 100 mg by mouth 2 (two) times daily.    [provider]  gabapentin  (NEURONTIN ) 300 MG capsule Take 300 mg by mouth at bedtime.    [provider]  lovastatin  (MEVACOR ) 40 MG tablet TAKE 1 TABLET(40 MG) BY MOUTH AT BEDTIME 10/23/23   [name], MD  metolazone  (ZAROXOLYN ) 2.5 MG tablet Take 2.5 mg by mouth daily.    [provider]  metoprolol  succinate (TOPROL  XL) 25 MG 24 hr tablet Take 1 tablet (25 mg total) by mouth every evening. 09/11/23   Aniceto Daphne CROME, NP  montelukast   (SINGULAIR ) 10 MG tablet TAKE 1 TABLET(10 MG) BY MOUTH AT BEDTIME 03/25/24   Geronimo Amel, MD  Multiple Vitamins-Minerals (PRESERVISION AREDS 2 PO) Take 2 capsules by mouth in the morning.    [provider]  omeprazole  (PRILOSEC) 20 MG capsule Take 20 mg by mouth daily.    [provider]  potassium chloride  (KLOR-CON ) 10 MEQ tablet TAKE 2 TABLETS(20 MEQ) BY MOUTH DAILY 02/09/24   Croitoru, Mihai, MD  Sodium Hypochlorite (ANASEPT) 0.057 % LIQD Apply topically.    [provider]  traMADol  (ULTRAM ) 50 MG tablet Take 1 tablet (50 mg total) by mouth 2 (two) times daily as needed. 04/11/24   Caro Harlene POUR, NP  traZODone  (DESYREL ) 50 MG tablet Take 1 tablet (50 mg total) by mouth at bedtime. 03/29/24   [name], MD  Vibegron  (GEMTESA ) 75 MG TABS Take 75 mg by mouth daily.    [provider]  vitamin B-12 (CYANOCOBALAMIN ) 500 MCG tablet Take 500 mcg by mouth 2 (two) times a week.    [provider]  Zinc  50 MG CAPS Take 1 capsule (50 mg total) by mouth daily. Patient not taking: Reported on 04/30/2024 11/04/19   [name], MD     Critical care time: 55 minutes     Inge Lecher, AGACNP-BC [facility] Prefer epic messenger for cross cover needs If after hours, please call E-link

## 2024-05-14 NOTE — ED Provider Notes (Signed)
 [facility] Provider Note    Event Date/Time   First MD Initiated Contact with Patient 05/14/24 1033     (approximate)   History   Shortness of Breath   HPI  [name] is a 78 year old female with history of CHF, rheumatic valve disease with valve replacement, COPD, AV block s/p pacemaker presenting to the emergency department for evaluation of weakness.  Patient was at her facility when she was found to be poorly responsive.  EMS was called and she was found to have initial O2 sat of 82%.  She was placed on 4 L.  Patient reports she has felt generally weak.  I reviewed her cardiology visit from 05/09/2024.  At that time she was seen by her cardiologist and found to have signs of hypervolemia with concern for right heart failure.  Was noted to have low normal blood pressures.  There was concerns for CHF exacerbation.  She was also noted to have AKI at that time at 1.9, baseline around 0.9.     Physical Exam   Triage Vital Signs: ED Triage Vitals  Encounter Vitals Group     BP 05/14/24 1041 (!) 102/57     Girls Systolic BP Percentile --      Girls Diastolic BP Percentile --      Boys Systolic BP Percentile --      Boys Diastolic BP Percentile --      Pulse Rate 05/14/24 1033 90     Resp 05/14/24 1033 20     Temp 05/14/24 1041 98.1 F (36.7 C)     Temp Source 05/14/24 1041 Oral     SpO2 05/14/24 1032 95 %     Weight --      Height --      Head Circumference --      Peak Flow --      Pain Score 05/14/24 1033 5     Pain Loc --      Pain Education --      Exclude from Growth Chart --     Most recent vital signs: Vitals:   05/14/24 1230 05/14/24 1300  BP: (!) 99/54 (!) 105/58  Pulse: 81 82  Resp: 15 16  Temp:    SpO2: 99% 100%     General: Somnolent but arousable CV:  Regular rate, good peripheral perfusion, bilateral lower extremity edema noted Resp:  Labored respirations, diminished bilaterally, no significant  wheezing Abd:  Nondistended, soft, no appreciable tenderness Neuro:  No gross facial asymmetry, generalized weakness of all 4 extremities   ED Results / Procedures / Treatments   Labs (all labs ordered are listed, but only abnormal results are displayed) Labs Reviewed  BLOOD GAS, VENOUS - Abnormal; Notable for the following components:      Result Value   pH, Ven 7.23 (*)    pCO2, Ven 84 (*)    pO2, Ven 57 (*)    Bicarbonate 35.2 (*)    Acid-Base Excess 4.7 (*)    All other components within normal limits  COMPREHENSIVE METABOLIC PANEL WITH GFR - Abnormal; Notable for the following components:   Chloride 95 (*)    Glucose, Bld 166 (*)    BUN 90 (*)    Creatinine, Ser 1.64 (*)    Calcium  8.8 (*)    Albumin  2.6 (*)    GFR, Estimated 32 (*)    All other components within normal limits  CBC WITH DIFFERENTIAL/PLATELET - Abnormal; Notable for the following  components:   WBC 11.1 (*)    RBC 3.24 (*)    Hemoglobin 10.4 (*)    HCT 34.9 (*)    MCV 107.7 (*)    MCHC 29.8 (*)    Platelets 132 (*)    Neutro Abs 9.8 (*)    Lymphs Abs 0.3 (*)    All other components within normal limits  BRAIN NATRIURETIC PEPTIDE - Abnormal; Notable for the following components:   B Natriuretic Peptide 631.8 (*)    All other components within normal limits  TROPONIN I (HIGH SENSITIVITY) - Abnormal; Notable for the following components:   Troponin I (High Sensitivity) 68 (*)    All other components within normal limits  RESP PANEL BY RT-PCR (RSV, FLU A&B, COVID)  RVPGX2  CULTURE, BLOOD (ROUTINE X 2)  CULTURE, BLOOD (ROUTINE X 2)  RESPIRATORY PANEL BY PCR  MRSA NEXT GEN BY PCR, NASAL  LACTIC ACID, PLASMA  LACTIC ACID, PLASMA  URINALYSIS, W/ REFLEX TO CULTURE (INFECTION SUSPECTED)  PROCALCITONIN  URINE DRUG SCREEN, QUALITATIVE ([facility] ONLY)  TROPONIN I (HIGH SENSITIVITY)     EKG EKG independently reviewed and interpreted by myself demonstrates:  EKG demonstrates likely paced rhythm at a rate  of 89, PR 153, QRS 214, QTc 565, no appreciable superimposed ischemia  RADIOLOGY Imaging independently reviewed and interpreted by myself demonstrates:  CXR with worsening pleural effusion without obvious pneumonia  Formal Radiology Read:  [facility] Chest Port 1 View Result Date: 05/14/2024 CLINICAL DATA:  Questionable sepsis - evaluate for abnormality Decreased level of consciousness.  Hypoxemia on supplemental oxygen. EXAM: PORTABLE CHEST 1 VIEW COMPARISON:  Radiographs 05/09/2024 and 04/06/2024.  CT 03/19/2024. FINDINGS: 1115 hours. The lung apices remain partially obscured by the patient's mandible. Left subclavian pacemaker leads appear unchanged, projecting over the right atrium and right ventricle. The heart size and mediastinal contours are stable post median sternotomy and dual valve replacement. Moderate-sized left pleural effusion has enlarged compared with the previous study with resulting increased left basilar airspace disease. Stable chronic right apical scarring. No evidence of pneumothorax or acute osseous abnormality. IMPRESSION: Interval enlargement of left pleural effusion with increased left basilar airspace disease, likely atelectasis. No other significant changes. Electronically Signed   By: Elsie Perone [name]   On: 05/14/2024 12:32    PROCEDURES:  Critical Care performed: Yes, see critical care procedure note(s)  CRITICAL CARE Performed by: Nilsa Dade   Total critical care time: 32 minutes  Critical care time was exclusive of separately billable procedures and treating other patients.  Critical care was necessary to treat or prevent imminent or life-threatening deterioration.  Critical care was time spent personally by me on the following activities: development of treatment plan with patient and/or surrogate as well as nursing, discussions with consultants, evaluation of patient's response to treatment, examination of patient, obtaining history from patient or surrogate,  ordering and performing treatments and interventions, ordering and review of laboratory studies, ordering and review of radiographic studies, pulse oximetry and re-evaluation of patient's condition.'   Procedures   MEDICATIONS ORDERED IN ED: Medications  Chlorhexidine  Gluconate Cloth 2 % PADS 6 each (has no administration in time range)  docusate sodium  (COLACE) capsule 100 mg (has no administration in time range)  polyethylene glycol (MIRALAX  / GLYCOLAX ) packet 17 g (has no administration in time range)  heparin  injection 5,000 Units (has no administration in time range)  furosemide  (LASIX ) injection 40 mg (has no administration in time range)  ipratropium-albuterol  (DUONEB) 0.5-2.5 (3) MG/3ML nebulizer solution  3 mL (has no administration in time range)  sodium chloride  0.9 % bolus 500 mL (0 mLs Intravenous Stopped 05/14/24 1253)     IMPRESSION / MDM / ASSESSMENT AND PLAN / ED COURSE  I reviewed the triage vital signs and the nursing notes.  Differential diagnosis includes, but is not limited to, CHF exacerbation, pneumonia, viral illness, anemia, electrolyte abnormality  Patient's presentation is most consistent with acute presentation with potential threat to life or bodily function.  78 year old female presenting with generalized weakness, found to be hypoxic with EMS.  Low normal blood pressures here.  ABG as below with acidosis, placed on BiPAP.  Labs with mild leukocytosis WBC of 11.1, stable anemia, CMP with AKI from baseline but slightly improved from recent priors with creatinine of 1.64.  BNP elevated at 630.  Troponin elevated at 68, likely related to demand in the setting of volume overload.  While on BiPAP, patient did have downtrending blood pressure with MAP under 65.  She was ordered for a small fluid bolus, but I am concerned that she is hypervolemic.  With this, will discuss with ICU team to see if diuretics, possibly pressor support and ICU admission is  appropriate.  Case reviewed with APP [place].  ICU team will evaluate.   Received update from ICU team.  They will admit the patient to facilitate diuresis with blood pressure support as needed.  Clinical Course as of 05/14/24 1340  Tue May 14, 2024  1106 Blood gas, venous(!!) VBG with significant respiratory acidosis with hypercarbia.  Will place on BiPAP. [NR]    Clinical Course User Index [NR] Levander Slate, MD     FINAL CLINICAL IMPRESSION(S) / ED DIAGNOSES   Final diagnoses:  Acute respiratory failure with hypoxia and hypercapnia (HCC)  Acute on chronic congestive heart failure, unspecified heart failure type (HCC)     Rx / DC Orders   ED Discharge Orders     None        Note:  This document was prepared using Dragon voice recognition software and may include unintentional dictation errors.   Levander Slate, MD 05/14/24 1340

## 2024-05-14 NOTE — Consult Note (Signed)
 Cardiology Consultation   Patient ID: [name] MRN: [id number]; DOB: 02/26/1946  Admit date: 05/14/2024 Date of Consult: 05/14/2024  PCP:  Diane Baller, MD   [facility] Providers Cardiologist:  Diane Balding, MD        Patient Profile: [name] is a 78 y.o. female with a hx of chronic diastolic heart failure, rheumatic valvular disease, history of aortic and mitral valve replacement in 2015, COPD, hyperlipidemia, AV block s/p dual-chamber pacemaker, atrial flutter, atrial fibrillation, and mild ascending aortic aneurysm who is being seen 05/14/2024 for the evaluation of acute on chronic diastolic heart failure at the request of Dr. Isaiah.  History of Present Illness: [name] has history detailed above.  She was recently hospitalized with pneumonia/COPD exacerbation in early Apr 10, 2024 and rehospitalized later in 2024-04-10 with worsening dyspnea.  Her chest x-ray showed a left-sided pleural effusion with thoracentesis demonstrating exudative fluid.  Echo 04/04/2024 showed normal LV systolic function with EF 65 to 70%, mildly dilated and moderately dysfunctional right ventricle secondary to severe pulmonary artery hypertension estimated at PAP 62 mmHg.  Mitral stenosis severe here with mean transmitral gradient of 13 mmHg.  BNP was 365 at hospital discharge 6/24.  She continued to experience severe weakness and oxygen supplementation and was discharged to a nursing facility.  She was most recently seen by cardiology as an outpatient 7/31 and noted to have steady weight gain since discharge from the hospital.  She had gained about 20 pounds since hospital discharge.  She also reported decreased urination.  Noted to be hypotensive as well.  Plan was to recheck the labs with further recommendations pending results.  Patient reported to [facility] ED via EMS from her skilled nursing facility for decreased responsiveness.  Her son is at the bedside and assists with history, as  patient is on BiPAP.  He reports that overall patient was doing well until yesterday.  She had reported some mild dyspnea, weakness, and nausea.  Denies chest pain, palpitations, lightheadedness, dizziness.  No recent illness or changes to diet/medications.  On EMS arrival, patient was found to have initial O2 sat of 82%.  In the ED, BP 102/57 and SpO2 95% on 4 L supplemental oxygen with otherwise normal vital signs.  Pertinent labs include BUN 90, creatinine 1.64, albumin  2.6, BNP 631, troponin 68 > 78, lactic 1.0, WBC 11.1, Hgb 10.4, HCT 34.9.  Venous blood gas was obtained and showed pH 7.23, PCO2 84, PO2 57, and bicarb 35.2.  EKG without acute ischemic changes.  Chest x-ray was obtained and showed left pleural effusion with likely atelectasis.  Patient was started on BiPAP.  Cardiology was asked to consult for further evaluation of acute on chronic diastolic heart failure.  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 Known DISH with thoracic spine fusion and moderate kyphosis, h/o L spine fusion L2-5   Adjustment disorder with depressed mood 2014-11-06   44 yo son died MI 10-10-2014 Husband died car accident 04/11/2015   Anemia    Anticoagulant long-term use 06/14/2019   She takes warfarin for afib/flutter with CHADS2VASc score of 4 (age, sex, CHF, HTN), s/p 2 bioprosthetic valve replacements Coumadin  changed to eliquis  2022 per cardiology   Aortic atherosclerosis 03/03/2022   Noted on 01/2022 chest CT   Arthritis  Arthropathy of spinal facet joint 03/08/2018   Ascending aortic aneurysm 03/02/2021   4.1cm on CT 01/2021 rec yearly monitoring   Bilateral hip pain 09/08/2016   Cancer (HCC)    conon cancer 2012   CAP (community acquired pneumonia) 02/08/2016   Carotid stenosis  12/08/2020   Carotid US  - 40-59% BICA stenosis (2015) Carotid US  12/2020 - 1-39% BICA stenosis, incidental 3.4cm structure behind L common carotid artery rec further imaging    Cataracts, bilateral    immature   Chest pain 08/25/2014   myoview  low risk 02/2018 (Camitz)   Chronic cholecystitis with calculus 01/21/2015   S/p cholecystectomy    Chronic diastolic heart failure 09/19/2014   Chronic insomnia    Chronic lower back pain 03/05/2008   Returned to Dr Colon - translaminar ESI at L3/4. Myelogram showed significant left sided foraminal stenosis at L2/3 and L3/4 in setting of solid arthrodesis, planned DEXA and if stable osteopenia, considering anterolateral decompression with spacer.    CKD (chronic kidney disease) stage 2, GFR 60-89 ml/min 07/03/2015   Constipation    takes Miralax  daily as needed   COPD (chronic obstructive pulmonary disease)    Albuterol  inhaler daily as needed;Duoneb daily as needed;Spiriva  daily   COVID-19 virus infection 11/11/2022   DDD (degenerative disc disease)    cervical - kyphosis with mod DD changes C5/6 and C6/7; lumbar - early DD at L2/3 (Elsner)   Depression    DISH (diffuse idiopathic skeletal hyperostosis) 05/14/2019   By MRI noted fusion of lower thoracic vertebrae - likely contributing to unsteadiness (Elsner)   Dyspnea on exertion 06/05/2015   myoview  low risk 02/2018   Edema of both lower legs due to peripheral venous insufficiency    Essential hypertension, benign 03/24/2009   hx of not on nmeds currenlty and has been a while since on meds per pt   Heart murmur    Hematuria 06/17/2021   History of kidney stones    History of radiation therapy 05/30/11 to 07/07/11   rectum   History of rectal cancer 05/26/2011   Rectal cancer 2012, midrectum [id number], s/p neoadj chemo&XRT, lap LAR with diverting loop ileostomy 08/2011, ileostomy takedown 03/2012 Discharged from oncology clinic 2017 Marquita)   History of shingles    History of vertebral  fracture 11/26/2019   Old L1 vertebral compression fracture incidentally noted on imaging 2020    Hyperlipemia    takes Lovastatin  daily   Lacunar infarction    Left caudate   Left hip pain 07/29/2020   Legally blind in left eye    Lymphangioma 01/07/2021   Noted incidentally on carotid US  12/2020 CT neck - Likely benign lymphangioma 01/2021   Macrocytosis 03/27/2022   Folate and b12 checked 2023   Malaise and fatigue 01/22/2018   Meralgia paresthetica 12/13/2007   Mixed stress and urge urinary incontinence 03/16/2015   Failed oxybutynin  IR/ER. Vesicare  initially helped but then no longer helpful.  myrbetriq  was not effective.  Saw urology (MacDiarmid) - UDS largely overactive bladder with mild-mod stress incontinence. Improved on abx course - maintained on macrodantin  and toviaz .    Obesity    Osteopenia 12/2014   T -1.5 hip   Other spondylosis with radiculopathy, lumbar region 07/17/2018   Pain of right sacroiliac joint 08/09/2017   Pancreatitis 11/2014   ?zpack related vs gallstone pancreatitis with abnormal HIDA scan pending cholecystectomy   Peripheral neuropathy 01/03/2022   Persistent atrial fibrillation 12/09/2020   Personal history of colonic polyps    PONV (  postoperative nausea and vomiting)    Positive occult stool blood test    Presence of permanent cardiac pacemaker    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   TEE showed this 05/2019 plan warfarin and recheck in 3 months (Croitoru)   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   Pulmonary infiltrates 04/23/2019   06/13/2018-CT super D chest without contrast- generally stable chronic lung disease with pleural parenchymal  scarring and scattered nodularity most of these nodules have been tree-in-bud in appearance likely postinfectious or inflammatory the dominant right lower lobe nodule seen on most recent study has resolved no new or enlarging nodules  04/22/2019-CT chest without contrast- waxing and waning per   Pulmonary nodule 02/23/2012   RLL nodule-82mm stable 2006, April 2009, and June 2009 Small pulm nodules Jan 2012 - > Oct 2012 without change   Rheumatic heart disease mitral stenosis    mod MS by echo 02/2014   Right leg swelling 06/11/2020   R venous US  06/2020 WNL: - No evidence of deep vein thrombosis in the lower extremity. No indirect evidence of obstruction proximal to the inguinal ligament. - No cystic structure found in the popliteal fossa.   S/P aortic and mitral valve bioprostheses 12/08/2013   AVR 21 mm, MVR 25 mm-MagnaEase pericardia   S/P AVR (aortic valve replacement) 2015   bioprosthetic (Bartle)   S/P MVR (mitral valve replacement) 2015   bioprosthetic (Bartle)   Sepsis secondary to UTI (HCC) 08/22/2017   Severe aortic valve stenosis 10/01/2013   mild-mod by echo 02/2014   Skin lesion 01/03/2022   Small bowel obstruction, partial 08/2013   reolved without NGT placement.    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   Dr Amy [place]   Syncope 05/28/2018   Thrombocytopenia 11/04/2019   Typical atrial flutter 05/15/2019   Vitamin B12 deficiency 02/28/2015   Start B12 shots 02/2015    Vitamin D  deficiency 10/23/2016   Wounds, multiple    bilateral legs    Past Surgical History:  Procedure Laterality Date   ANTERIOR LAT LUMBAR FUSION Left 07/17/2018   Procedure: Lumbar Two-Three Lumbar Three-Four Anterolateral decompression/interbody fusion with lateral plate fixation/Infuse;  Surgeon: Colon Shove, MD;  Location: [facility] OR;  Service: Neurosurgery;  Laterality: Left;  Lumbar Two-Three Lumbar Three-Four Anterolateral decompression/interbody fusion with lateral plate  fixation/Infuse   AORTIC VALVE REPLACEMENT N/A 12/12/2013   Procedure: AORTIC VALVE REPLACEMENT (AVR);  Surgeon: Dorise MARLA Fellers, MD; Service: Open Heart Surgery   BACK SURGERY  2006, 2007   2006 SPACER, 2007 decompression and fusion L4/5   BOWEL RESECTION  04/02/2012   Procedure: SMALL BOWEL RESECTION;  Surgeon: Jina Nephew, MD;  Location: [facility] ORS;  Service: General;  Laterality: N/A;   BRONCHIAL WASHINGS  03/24/2021   Procedure: BRONCHIAL WASHINGS;  Surgeon: Geronimo Amel, MD;  Location: [facility] ENDOSCOPY;  Service: Endoscopy;;   CARDIOVERSION N/A 08/29/2023   Procedure: CARDIOVERSION (CATH LAB);  Surgeon: Pietro Redell RAMAN, MD;  Location: [facility] INVASIVE CV LAB;  Service: Cardiovascular;  Laterality: N/A;   CARPAL TUNNEL RELEASE Bilateral    CATARACT EXTRACTION W/ INTRAOCULAR LENS IMPLANT Left 10/18/2016   CATARACT EXTRACTION W/ INTRAOCULAR LENS IMPLANT Right 12/13/2016   Dr. Rosan   CHOLECYSTECTOMY N/A 01/21/2015   chronic  cholecystitis, Jina Nephew, MD   COLON RESECTION  08/25/2011   Procedure: COLON RESECTION LAPAROSCOPIC;  Surgeon: Jina Nephew, MD;  Location: [facility];  Service: General;  Laterality: N/A;  Laparoscopic Assisted Low Anterior Resection Diverting Ostomy and onQ pain pump   COLONOSCOPY  03/2013   1 polyp, rpt 3 yrs Marianne)   COLONOSCOPY  05/2021   diverticulosis and patent colon anastomosis with some edema/narrowing, rpt 5 yrs Marianne)   COLONOSCOPY WITH PROPOFOL  N/A 06/09/2016   patent colo-colonic anastomosis, rpt 5 yrs Marianne)   COLONOSCOPY WITH PROPOFOL  N/A 01/25/2024   TA, fair prep (consider repeat if needed), patent rectal anastomosis with congestion edema and stenosis, mild sigmoid diverticulosis Zane Groom, MD)   CYSTOSCOPY/URETEROSCOPY/HOLMIUM LASER/STENT PLACEMENT Right 07/03/2023   Procedure: CYSTOSCOPY RIGHT RETROGRADE PYEPLGRAM RIGHT URETEROSCOPY/HOLMIUM LASER/STENT PLACEMENT;  Surgeon: Carolee Sherwood JONETTA DOUGLAS, MD;  Location: [facility];  Service: Urology;   Laterality: Right;  1 HR FOR CASE   CYSTOSCOPY/URETEROSCOPY/HOLMIUM LASER/STENT PLACEMENT Right 07/17/2023   Procedure: CYSTOSCOPY/RIGHT URETEROSCOPY/STENT EXCHANGE;  Surgeon: Carolee Sherwood JONETTA DOUGLAS, MD;  Location: [facility];  Service: Urology;  Laterality: Right;  60 MINS FOR CASE   EP IMPLANTABLE DEVICE N/A 06/16/2016   Procedure: Pacemaker Implant;  Surgeon: Will Gladis Norton, MD;  Location: [facility] INVASIVE CV LAB;  Service: Cardiovascular;  Laterality: N/A;   ESOPHAGOGASTRODUODENOSCOPY (EGD) WITH PROPOFOL  N/A 01/25/2024   diffuse gastritis benign biopsy Zane, Groom, MD)   FOOT SURGERY  left foot   hammer toe   ILEOSTOMY  08/25/2011   ILEOSTOMY CLOSURE  04/02/2012   Procedure: ILEOSTOMY TAKEDOWN;  Surgeon: Jina Nephew, MD;  Location: [facility];  Service: General;  Laterality: N/A;   INTRAOPERATIVE TRANSESOPHAGEAL ECHOCARDIOGRAM N/A 12/12/2013   Procedure: INTRAOPERATIVE TRANSESOPHAGEAL ECHOCARDIOGRAM;  Surgeon: Dorise MARLA Fellers, MD;  Location: [facility] OR;  Service: Open Heart Surgery;  Laterality: N/A;   LEFT AND RIGHT HEART CATHETERIZATION WITH CORONARY ANGIOGRAM N/A 11/27/2013   Procedure: LEFT AND RIGHT HEART CATHETERIZATION WITH CORONARY ANGIOGRAM;  Surgeon: Diane Balding, MD;  Location: [facility] CATH LAB;  Service: Cardiovascular;  Laterality: N/A;   MITRAL VALVE REPLACEMENT N/A 12/12/2013   Procedure: MITRAL VALVE (MV) REPLACEMENT OR REPAIR;  Surgeon: Dorise MARLA Fellers, MD; Service: Open Heart Surgery   POLYPECTOMY  01/25/2024   Procedure: POLYPECTOMY, INTESTINE;  Surgeon: Charlanne Groom, MD;  Location: [facility];  Service: Gastroenterology;;   TEE WITHOUT CARDIOVERSION N/A 11/29/2013   Procedure: TRANSESOPHAGEAL ECHOCARDIOGRAM (TEE);  Surgeon: Wilbert JONELLE Bihari, MD;  Location: [facility] ENDOSCOPY;  Service: Cardiovascular;  Laterality: N/A;   TEE WITHOUT CARDIOVERSION N/A 06/04/2019   Procedure: TRANSESOPHAGEAL ECHOCARDIOGRAM (TEE);  Surgeon: [name] Jerel, MD;  Location: [facility] ENDOSCOPY;  Service: Cardiovascular;   Laterality: N/A;   TONSILLECTOMY     TRANSESOPHAGEAL ECHOCARDIOGRAM (CATH LAB) N/A 08/29/2023   Procedure: TRANSESOPHAGEAL ECHOCARDIOGRAM;  Surgeon: Pietro Redell RAMAN, MD;  Location: [facility] INVASIVE CV LAB;  Service: Cardiovascular;  Laterality: N/A;   VIDEO BRONCHOSCOPY N/A 03/24/2021   Procedure: VIDEO BRONCHOSCOPY WITHOUT FLUORO;  Surgeon: Geronimo Amel, MD;  Location: [facility] ENDOS[facility];  Service: Endoscopy;  Laterality: N/A;  SMALL SCOPE,PREMEDICATE WITH NEBULIZED LIDOCAINE        Scheduled Meds:  Chlorhexidine  Gluconate Cloth  6 each Topical Daily   furosemide   40 mg Intravenous Once   heparin   5,000 Units Subcutaneous Q8H   Continuous Infusions:  PRN Meds: docusate sodium , ipratropium-albuterol , polyethylene glycol  Allergies:    Allergies  Allergen Reactions   Tape Other (See Comments)    THICK TAPES PULLS OFF MY  SKIN   Codeine Nausea Only   Prednisone  Nausea Only   Sulfa Antibiotics Nausea Only    Social History:   Social History   Socioeconomic History   Marital status: Widowed    Spouse name: Not on file   Number of children: 2   Years of education: 74   Highest education level: 12th grade  Occupational History   Occupation: Retired  Tobacco Use   Smoking status: Former    Current packs/day: 0.00    Average packs/day: 1.5 packs/day for 10.0 years (15.0 ttl pk-yrs)    Types: Cigarettes    Start date: 12/27/1964    Quit date: 12/28/1974    Years since quitting: 49.4   Smokeless tobacco: Never   Tobacco comments:    quit smoking in 1976  Vaping Use   Vaping status: Never Used  Substance and Sexual Activity   Alcohol  use: Never    Alcohol /week: 2.0 standard drinks of alcohol     Types: 2 Glasses of wine per week   Drug use: No   Sexual activity: Not Currently  Other Topics Concern   Not on file  Social History Narrative   Widow. Husband died 03-29-15 MVA. 1 cat.    2 sons; 1 grandchild. One son died of MI 36   Edu: Completed HS, some college   Married  1966   Retired-[employer], volunteers at [employer]   Activity: pulm rehab   Diet: some water , fruits/vegetables daily      Right handed   Caffeine: 4 glasses of tea and 1 diet coke a day   Social Drivers of Corporate investment banker Strain: Low Risk  (11/03/2023)   Overall Financial Resource Strain (CARDIA)    Difficulty of Paying Living Expenses: Not hard at all  Food Insecurity: No Food Insecurity (04/02/2024)   Hunger Vital Sign    Worried About Running Out of Food in the Last Year: Never true    Ran Out of Food in the Last Year: Never true  Transportation Needs: No Transportation Needs (04/02/2024)   PRAPARE - Administrator, Civil Service (Medical): No    Lack of Transportation (Non-Medical): No  Physical Activity: Unknown (11/03/2023)   Exercise Vital Sign    Days of Exercise per Week: 0 days    Minutes of Exercise per Session: Not on file  Stress: No Stress Concern Present (11/03/2023)   [employer] of Occupational Health - Occupational Stress Questionnaire    Feeling of Stress : Only a little  Social Connections: Moderately Integrated (04/02/2024)   Social Connection and Isolation Panel    Frequency of Communication with Friends and Family: More than three times a week    Frequency of Social Gatherings with Friends and Family: Once a week    Attends Religious Services: 1 to 4 times per year    Active Member of [employer] or Organizations: Yes    Attends Banker Meetings: 1 to 4 times per year    Marital Status: Widowed  Intimate Partner Violence: Not At Risk (04/02/2024)   Humiliation, Afraid, Rape, and Kick questionnaire    Fear of Current or Ex-Partner: No    Emotionally Abused: No    Physically Abused: No    Sexually Abused: No    Family History:    Family History  Problem Relation Age of Onset   Heart disease Mother    Diabetes Mother    Hypertension Mother    Stroke Mother    Lung cancer  Father    Breast cancer Maternal Aunt 4   Heart attack  Maternal Grandfather    CAD Son 50   Heart attack Son 51   Rectal cancer Neg Hx    Stomach cancer Neg Hx    Esophageal cancer Neg Hx    Colon cancer Neg Hx      ROS:  Please see the history of present illness.   Physical Exam/Data: Vitals:   05/14/24 1230 05/14/24 1300 05/14/24 1400 05/14/24 1520  BP: (!) 99/54 (!) 105/58 (!) 94/53   Pulse: 81 82 79 87  Resp: 15 16 (!) 24 17  Temp:      TempSrc:      SpO2: 99% 100% 98% 100%    Intake/Output Summary (Last 24 hours) at 05/14/2024 1627 Last data filed at 05/14/2024 1253 Gross per 24 hour  Intake 500 ml  Output --  Net 500 ml      05/09/2024   10:29 AM 04/30/2024   12:14 PM 04/29/2024    2:40 PM  Last 3 Weights  Weight (lbs) 165 lb 9.6 oz 160 lb 6.4 oz 160 lb  Weight (kg) 75.116 kg 72.757 kg 72.576 kg     There is no height or weight on file to calculate BMI.  General: On BiPAP HEENT: normal Neck: Unable to assess JVD Vascular: No carotid bruits; Distal pulses 2+ bilaterally Cardiac:  normal S1, S2; RRR; 2/6 systolic murmur Lungs:  clear to auscultation bilaterally, no wheezing, rhonchi or rales  Abd: soft, nontender, no hepatomegaly  Ext: LE edema extending to the upper thighs Skin: warm and dry  Psych:  Normal affect   EKG:  The EKG was personally reviewed and demonstrates:  V paced rhythm without acute ischemic changes Telemetry:  Telemetry was personally reviewed and demonstrates: Sinus rhythm, V paced with PVCs  Relevant CV Studies:  04/04/2024 Echo complete 1. Left ventricular ejection fraction, by estimation, is 65 to 70%. The  left ventricle has normal function. The left ventricle has no regional  wall motion abnormalities. There is moderate concentric left ventricular  hypertrophy. Left ventricular  diastolic parameters are indeterminate.   2. Right ventricular systolic function is moderately reduced. The right  ventricular size is mildly enlarged. There is severely elevated pulmonary  artery systolic  pressure. The estimated right ventricular systolic  pressure is 61.9 mmHg.   3. Right atrial size was mildly dilated.   4. The mitral valve has been repaired/replaced. No evidence of mitral  valve regurgitation. Severe mitral stenosis. The mean mitral valve  gradient is 13.0 mmHg. There is a 25 mm bioprosthetic valve present in the  mitral position. Procedure Date:  12/08/2013. Echo findings are consistent with stenosis the mitral  prosthesis.   5. Large pleural effusion.   6. Tricuspid valve regurgitation is moderate.   7. The aortic valve has been repaired/replaced. Aortic valve  regurgitation is not visualized. Aortic valve sclerosis is present, with  no evidence of aortic valve stenosis. There is a 21 mm Magna Ease valve  present in the aortic position. Procedure Date:   12/08/2013.   8. Aortic dilatation noted. There is mild dilatation of the ascending  aorta, measuring 42 mm.   9. The inferior vena cava is dilated in size with >50% respiratory  variability, suggesting right atrial pressure of 8 mmHg.   Laboratory Data: High Sensitivity Troponin:   Recent Labs  Lab 05/14/24 1039 05/14/24 1329  TROPONINIHS 68* 78*     Chemistry Recent Labs  Lab  05/09/24 1243 05/14/24 1039  NA 139 136  K 4.6 4.0  CL 96 95*  CO2 25 32  GLUCOSE 81 166*  BUN 87* 90*  CREATININE 1.82* 1.64*  CALCIUM  8.9 8.8*  MG 2.2  --   GFRNONAA  --  32*  ANIONGAP  --  9    Recent Labs  Lab 05/14/24 1039  PROT 6.5  ALBUMIN  2.6*  AST 31  ALT 14  ALKPHOS 85  BILITOT 0.6   Lipids No results for input(s): CHOL, TRIG, HDL, LABVLDL, LDLCALC, CHOLHDL in the last 168 hours.  Hematology Recent Labs  Lab 05/14/24 1039 05/14/24 1518  WBC 11.1* 12.0*  RBC 3.24* 3.25*  HGB 10.4* 10.8*  HCT 34.9* 35.7*  MCV 107.7* 109.8*  MCH 32.1 33.2  MCHC 29.8* 30.3  RDW 14.3 14.4  PLT 132* 110*   Thyroid  No results for input(s): TSH, FREET4 in the last 168 hours.  BNP Recent Labs  Lab  05/09/24 1243 05/14/24 1039  BNP 287.9* 631.8*    DDimer No results for input(s): DDIMER in the last 168 hours.  Radiology/Studies:  DG Chest Port 1 View Result Date: 05/14/2024 IMPRESSION: Interval enlargement of left pleural effusion with increased left basilar airspace disease, likely atelectasis. No other significant changes. Electronically Signed   By: Elsie Perone [name]   On: 05/14/2024 12:32   Assessment and Plan:  Acute on chronic diastolic heart failure Pulmonary arterial hypertension Aortic and mitral valve replacement with severe mitral stenosis Left pleural effusion - Recent hospitalizations for COPD exacerbation, pneumonia, and left pleural effusion. Now presenting with AMS, dyspnea, and weakness.  - Most recent echo 6/26 showed EF 65 to 70% with moderately reduced RV systolic function and severe pulmonary hypertension, severe mitral stenosis with mean gradient 13 mmHg - Appears severely hypervolemic - Difficult clinical picture given AKI and hypotension.  However, would recommend to trial of IV diuresis. - Repeat echo ordered with further recommendations pending results  Acute hypoxic and hypercapnic respiratory failure - In the setting of above - Patient on BiPAP at time of exam - Ongoing management per CCM  Acute kidney injury - Creatinine 1.64 on admission, appears baseline is near 0.8 - Monitor closely with diuresis  Paroxysmal atrial fibrillation - Currently maintaining sinus rhythm - PTA BB held in the setting of hypotension - Would recommend anticoagulation with IV heparin  with plan to transition back to PTA Eliquis  5 mg twice daily once able  Anemia Thrombocytopenia - Noted on prior admissions - Hemoglobin stable  For questions or updates, please contact [facility] Please consult [email or web address] for contact info under    Signed, Lesley LITTIE Maffucci, PA-C  05/14/2024 4:27 PM

## 2024-05-14 NOTE — Progress Notes (Addendum)
 Wounds on admission. DTI    dti

## 2024-05-15 ENCOUNTER — Inpatient Hospital Stay

## 2024-05-15 DIAGNOSIS — D649 Anemia, unspecified: Secondary | ICD-10-CM

## 2024-05-15 DIAGNOSIS — R7989 Other specified abnormal findings of blood chemistry: Secondary | ICD-10-CM

## 2024-05-15 DIAGNOSIS — Z952 Presence of prosthetic heart valve: Secondary | ICD-10-CM

## 2024-05-15 DIAGNOSIS — J449 Chronic obstructive pulmonary disease, unspecified: Secondary | ICD-10-CM

## 2024-05-15 DIAGNOSIS — D696 Thrombocytopenia, unspecified: Secondary | ICD-10-CM

## 2024-05-15 DIAGNOSIS — I509 Heart failure, unspecified: Secondary | ICD-10-CM | POA: Diagnosis not present

## 2024-05-15 DIAGNOSIS — Z515 Encounter for palliative care: Secondary | ICD-10-CM | POA: Diagnosis not present

## 2024-05-15 DIAGNOSIS — J9602 Acute respiratory failure with hypercapnia: Secondary | ICD-10-CM

## 2024-05-15 DIAGNOSIS — I5031 Acute diastolic (congestive) heart failure: Secondary | ICD-10-CM

## 2024-05-15 DIAGNOSIS — J9601 Acute respiratory failure with hypoxia: Secondary | ICD-10-CM | POA: Diagnosis not present

## 2024-05-15 DIAGNOSIS — J90 Pleural effusion, not elsewhere classified: Secondary | ICD-10-CM | POA: Diagnosis not present

## 2024-05-15 DIAGNOSIS — I272 Pulmonary hypertension, unspecified: Secondary | ICD-10-CM | POA: Diagnosis not present

## 2024-05-15 DIAGNOSIS — J9621 Acute and chronic respiratory failure with hypoxia: Secondary | ICD-10-CM | POA: Diagnosis not present

## 2024-05-15 LAB — URINALYSIS, W/ REFLEX TO CULTURE (INFECTION SUSPECTED)
Bilirubin Urine: NEGATIVE
Glucose, UA: NEGATIVE mg/dL
Hgb urine dipstick: NEGATIVE
Ketones, ur: NEGATIVE mg/dL
Leukocytes,Ua: NEGATIVE
Nitrite: NEGATIVE
Protein, ur: 100 mg/dL — AB
Specific Gravity, Urine: 1.013 (ref 1.005–1.030)
pH: 5 (ref 5.0–8.0)

## 2024-05-15 LAB — RENAL FUNCTION PANEL
Albumin: 2.7 g/dL — ABNORMAL LOW (ref 3.5–5.0)
Anion gap: 10 (ref 5–15)
BUN: 93 mg/dL — ABNORMAL HIGH (ref 8–23)
CO2: 32 mmol/L (ref 22–32)
Calcium: 8.9 mg/dL (ref 8.9–10.3)
Chloride: 95 mmol/L — ABNORMAL LOW (ref 98–111)
Creatinine, Ser: 1.74 mg/dL — ABNORMAL HIGH (ref 0.44–1.00)
GFR, Estimated: 30 mL/min — ABNORMAL LOW (ref >60)
Glucose, Bld: 133 mg/dL — ABNORMAL HIGH (ref 70–99)
Phosphorus: 6.4 mg/dL — ABNORMAL HIGH (ref 2.5–4.6)
Potassium: 4.4 mmol/L (ref 3.5–5.1)
Sodium: 137 mmol/L (ref 135–145)

## 2024-05-15 LAB — URINE DRUG SCREEN, QUALITATIVE (ARMC ONLY)
Amphetamines, Ur Screen: NOT DETECTED
Barbiturates, Ur Screen: NOT DETECTED
Benzodiazepine, Ur Scrn: NOT DETECTED
Cannabinoid 50 Ng, Ur ~~LOC~~: NOT DETECTED
Cocaine Metabolite,Ur ~~LOC~~: NOT DETECTED
MDMA (Ecstasy)Ur Screen: NOT DETECTED
Methadone Scn, Ur: NOT DETECTED
Opiate, Ur Screen: NOT DETECTED
Phencyclidine (PCP) Ur S: NOT DETECTED
Tricyclic, Ur Screen: NOT DETECTED

## 2024-05-15 LAB — MAGNESIUM: Magnesium: 2.2 mg/dL (ref 1.7–2.4)

## 2024-05-15 LAB — RESPIRATORY PANEL BY PCR

## 2024-05-15 LAB — ECHOCARDIOGRAM COMPLETE
AV Mean grad: 14.5 mmHg
AV Peak grad: 27.3 mmHg
Ao pk vel: 2.61 m/s
Area-P 1/2: 5.38 cm2
S' Lateral: 1.95 cm

## 2024-05-15 LAB — BLOOD GAS, ARTERIAL
Acid-Base Excess: 4.4 mmol/L — ABNORMAL HIGH (ref 0.0–2.0)
Bicarbonate: 33.2 mmol/L — ABNORMAL HIGH (ref 20.0–28.0)
O2 Content: 5 L/min
O2 Saturation: 98.8 %
Patient temperature: 37
pCO2 arterial: 69 mmHg (ref 32–48)
pH, Arterial: 7.29 — ABNORMAL LOW (ref 7.35–7.45)
pO2, Arterial: 171 mmHg — ABNORMAL HIGH (ref 83–108)

## 2024-05-15 LAB — HEPARIN LEVEL (UNFRACTIONATED): Heparin Unfractionated: >1.1 [IU]/mL — ABNORMAL HIGH (ref 0.30–0.70)

## 2024-05-15 LAB — CBC
HCT: 34.8 % — ABNORMAL LOW (ref 36.0–46.0)
Hemoglobin: 10.6 g/dL — ABNORMAL LOW (ref 12.0–15.0)
MCH: 33.1 pg (ref 26.0–34.0)
MCHC: 30.5 g/dL (ref 30.0–36.0)
MCV: 108.8 fL — ABNORMAL HIGH (ref 80.0–100.0)
Platelets: 175 K/uL (ref 150–400)
RBC: 3.2 MIL/uL — ABNORMAL LOW (ref 3.87–5.11)
RDW: 14.3 % (ref 11.5–15.5)
WBC: 11.9 K/uL — ABNORMAL HIGH (ref 4.0–10.5)
nRBC: 0 % (ref 0.0–0.2)

## 2024-05-15 LAB — APTT: aPTT: 139 s — ABNORMAL HIGH (ref 24–36)

## 2024-05-15 MED ORDER — HALOPERIDOL LACTATE 5 MG/ML IJ SOLN: 2.5000 mg | INTRAMUSCULAR | Status: DC | PRN

## 2024-05-15 MED ORDER — SODIUM CHLORIDE 0.9 % IV SOLN: INTRAVENOUS | Status: DC

## 2024-05-15 MED ORDER — ONDANSETRON HCL 4 MG/2ML IJ SOLN
4.0000 mg | Freq: Four times a day (QID) | INTRAMUSCULAR | Status: DC | PRN
  Administered 2024-05-15: 4 mg via INTRAVENOUS

## 2024-05-15 MED ORDER — ACETAMINOPHEN 650 MG RE SUPP: 650.0000 mg | Freq: Four times a day (QID) | RECTAL | Status: DC | PRN

## 2024-05-15 MED ORDER — ONDANSETRON HCL 4 MG/2ML IJ SOLN
INTRAMUSCULAR | Status: AC
Start: 2024-05-15 — End: 2024-05-15
  Filled 2024-05-15: qty 2

## 2024-05-15 MED ORDER — GLYCOPYRROLATE 0.2 MG/ML IJ SOLN: 0.2000 mg | INTRAMUSCULAR | Status: DC | PRN

## 2024-05-15 MED ORDER — MORPHINE SULFATE (PF) 2 MG/ML IV SOLN
2.0000 mg | INTRAVENOUS | Status: DC | PRN
  Administered 2024-05-15: 2 mg via INTRAVENOUS
  Filled 2024-05-15: qty 1

## 2024-05-15 MED ORDER — ACETAMINOPHEN 325 MG PO TABS: 650.0000 mg | ORAL_TABLET | Freq: Four times a day (QID) | ORAL | Status: DC | PRN

## 2024-05-15 MED ORDER — POLYVINYL ALCOHOL 1.4 % OP SOLN: 1.0000 [drp] | Freq: Four times a day (QID) | OPHTHALMIC | Status: DC | PRN

## 2024-05-15 MED ORDER — GERHARDT'S BUTT CREAM: TOPICAL_CREAM | Freq: Three times a day (TID) | CUTANEOUS | Status: DC | PRN

## 2024-05-15 MED ORDER — GLYCOPYRROLATE 1 MG PO TABS: 1.0000 mg | ORAL_TABLET | ORAL | Status: DC | PRN

## 2024-05-15 MED ORDER — MIDAZOLAM HCL 2 MG/2ML IJ SOLN: 2.0000 mg | INTRAMUSCULAR | Status: DC | PRN

## 2024-05-17 ENCOUNTER — Telehealth: Payer: Self-pay | Admitting: Family Medicine

## 2024-05-17 NOTE — Telephone Encounter (Signed)
 Spoke with son Lynwood - expressed my condolences.

## 2024-05-19 LAB — CULTURE, BLOOD (ROUTINE X 2)
Culture: NO GROWTH
Culture: NO GROWTH

## 2024-05-19 LAB — ACID FAST CULTURE WITH REFLEXED SENSITIVITIES (MYCOBACTERIA)
Acid Fast Culture: NEGATIVE
Source of Sample: 183526

## 2024-05-30 ENCOUNTER — Encounter: Admitting: Cardiovascular Disease

## 2024-06-06 ENCOUNTER — Inpatient Hospital Stay: Admitting: Internal Medicine

## 2024-06-10 NOTE — Consult Note (Signed)
 PHARMACY CONSULT NOTE - ELECTROLYTES  Pharmacy Consult for Electrolyte Monitoring and Replacement   Recent Labs: Potassium  Date Value  27-May-2024 4.4 mmol/L  07/28/2014 3.6 mEq/L   Magnesium  (mg/dL)  Date Value  91/93/7974 2.2   Calcium  (mg/dL)  Date Value  91/93/7974 8.9  07/28/2014 10.0   Albumin  (g/dL)  Date Value  91/93/7974 2.7 (L)  10/04/2016 4.0  11/15/2012 3.3 (L)   Phosphorus (mg/dL)  Date Value  91/93/7974 6.4 (H)   Sodium  Date Value  2024-05-27 137 mmol/L  05/09/2024 139 mmol/L  07/28/2014 143 mEq/L   Corrected Ca: 9.9 mg/dL  Weight: 73 kg (839 lb 15 oz) Estimated Creatinine Clearance: 26.1 mL/min (A) (by C-G formula based on SCr of 1.74 mg/dL (H)).  Assessment  [name] is a 78 y.o. female presenting with acute respiratory failure. PMH significant for COPD, HFpEF, mitral valve replacement, and CKD stage II. Pharmacy has been consulted to monitor and replace electrolytes.  Diet: NPO MIVF: N/A Pertinent medications: N/A  Goal of Therapy: Electrolytes within normal limits  Plan:  No supplementation needed at this time  Thank you for allowing pharmacy to be a part of this patient's care.  Huxley Vanwagoner [place], PharmD Candidate 2024/05/27 7:48 AM

## 2024-06-10 NOTE — Progress Notes (Signed)
 NAME:  [name], MRN:  [id number], DOB:  08-27-1946, LOS: 1 ADMISSION DATE:  05/14/2024, CONSULTATION DATE:  05/14/24 REFERRING MD:  Dr. Levander, CHIEF COMPLAINT:  Altered Mental Status   Brief Pt Description / Synopsis:  78 y.o. 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 Fib, and COPD admitted with Acute Hypoxic and Hypercapnic Respiratory Failure and Acute Metabolic Encephalopathy due to Acute Decompensated HFpEF (also with concern for RV failure) requiring BiPAP, along with AKI.  History of Present Illness:  [name] is a 78 y.o. 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  Patient is currently altered and unable to contribute to history, therefore history is obtained from chart review and from her son's report at bedside.  It is reported that upon EMS arrival, she was poorly responsive and hypoxic with O2 saturations of 82% of which she was placed on 4L [place].    Of note she recently seen outpatient by her Cardiologist Dr. Francyne on 05/09/24 for hypervolemia with concern for RV failure and poor response to both loop + thiazide diuretics due to AKI and soft blood pressures.   ED Course: Initial Vital Signs: Temperature 98.1 F, pulse 90, RR 20, BP 102/57, SpO2 94% on 4L [place] Significant Labs: glucose 166, BUN 90, Creatinine 1.64, BNP 631, HS Troponin 68, lactic acid 1, procalcitonin <0.1, WBC 11.1, Hgb 10.4, Hct 34.9, platelets 132 VBG: pH  7.23/pCO2 57/pO2 57/Bicarb 35.2 COVID PCR is negative Imaging Chest X-ray>>IMPRESSION: Interval enlargement of left pleural effusion with increased left basilar airspace disease, likely atelectasis. No other significant changes. Medications Administered: 500 cc NS bolus  PCCM asked to admit for further workup and treatment, Cardiology has been consulted for assistance with Acute CHF Exacerbation.  Please see Significant Hospital Events section below for full detailed hospital course.   Pertinent  Medical History   Past Medical History:  Diagnosis Date   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 Known DISH with thoracic spine fusion and moderate kyphosis, h/o L spine fusion L2-5   Adjustment disorder with depressed mood 07-Nov-2014   53 yo son died MI October 11, 2014 Husband died car accident 04-12-2015   Anemia    Anticoagulant long-term use 06/14/2019   She takes warfarin for afib/flutter with CHADS2VASc score of 4 (age, sex, CHF, HTN), s/p 2 bioprosthetic valve replacements Coumadin  changed to eliquis  2022 per cardiology   Aortic atherosclerosis 03/03/2022   Noted on 01/2022 chest CT   Arthritis    Arthropathy of spinal facet joint 03/08/2018   Ascending aortic aneurysm 03/02/2021   4.1cm on CT 01/2021 rec yearly monitoring   Bilateral hip pain 09/08/2016   Cancer (HCC)    conon cancer 2012   CAP (community acquired pneumonia) 02/08/2016   Carotid stenosis 12/08/2020   Carotid US  - 40-59% BICA stenosis (2015) Carotid US  12/2020 - 1-39% BICA stenosis, incidental  3.4cm structure behind L common carotid artery rec further imaging    Cataracts, bilateral    immature   Chest pain 08/25/2014   myoview  low risk 02/2018 ([place]itz)   Chronic cholecystitis with calculus 01/21/2015   S/p cholecystectomy    Chronic  diastolic heart failure 09/19/2014   Chronic insomnia    Chronic lower back pain 03/05/2008   Returned to Dr Colon - translaminar ESI at L3/4. Myelogram showed significant left sided foraminal stenosis at L2/3 and L3/4 in setting of solid arthrodesis, planned DEXA and if stable osteopenia, considering anterolateral decompression with spacer.    CKD (chronic kidney disease) stage 2, GFR 60-89 ml/min 07/03/2015   Constipation    takes Miralax  daily as needed   COPD (chronic obstructive pulmonary disease)    Albuterol  inhaler daily as needed;Duoneb daily as needed;Spiriva  daily   COVID-19 virus infection 11/11/2022   DDD (degenerative disc disease)    cervical - kyphosis with mod DD changes C5/6 and C6/7; lumbar - early DD at L2/3 (Elsner)   Depression    DISH (diffuse idiopathic skeletal hyperostosis) 05/14/2019   By MRI noted fusion of lower thoracic vertebrae - likely contributing to unsteadiness (Elsner)   Dyspnea on exertion 06/05/2015   myoview  low risk 02/2018   Edema of both lower legs due to peripheral venous insufficiency    Essential hypertension, benign 03/24/2009   hx of not on nmeds currenlty and has been a while since on meds per pt   Heart murmur    Hematuria 06/17/2021   History of kidney stones    History of radiation therapy 05/30/11 to 07/07/11   rectum   History of rectal cancer 05/26/2011   Rectal cancer 2012, midrectum [id number], s/p neoadj chemo&XRT, lap LAR with diverting loop ileostomy 08/2011, ileostomy takedown 03/2012 Discharged from oncology clinic 2017 Marquita)   History of shingles    History of vertebral fracture 11/26/2019   Old L1 vertebral compression fracture incidentally noted on imaging 2020    Hyperlipemia    takes Lovastatin  daily   Lacunar infarction    Left caudate   Left hip pain 07/29/2020   Legally blind in left eye    Lymphangioma 01/07/2021   Noted incidentally on carotid US  12/2020 CT neck - Likely benign lymphangioma 01/2021    Macrocytosis 03/27/2022   Folate and b12 checked 2023   Malaise and fatigue 01/22/2018   Meralgia paresthetica 12/13/2007   Mixed stress and urge urinary incontinence 03/16/2015   Failed oxybutynin  IR/ER. Vesicare  initially helped but then no longer helpful.  myrbetriq  was not effective.  Saw urology (MacDiarmid) - UDS largely overactive bladder with mild-mod stress incontinence. Improved on abx course - maintained on macrodantin  and toviaz .    Obesity    Osteopenia 12/2014   T -1.5 hip   Other spondylosis with radiculopathy, lumbar region 07/17/2018   Pain of right sacroiliac joint 08/09/2017   Pancreatitis 11/2014   ?zpack related vs gallstone pancreatitis with abnormal HIDA scan pending cholecystectomy   Peripheral neuropathy 01/03/2022   Persistent atrial fibrillation 12/09/2020   Personal history of colonic polyps    PONV (postoperative nausea and vomiting)    Positive occult stool blood test    Presence of permanent cardiac pacemaker    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   TEE showed this 05/2019 plan warfarin and recheck in 3 months (Croitoru)   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   Pulmonary infiltrates 04/23/2019   06/13/2018-CT super D chest without contrast- generally stable chronic lung disease with pleural parenchymal scarring and scattered nodularity most of these nodules have been tree-in-bud in appearance likely postinfectious or inflammatory the dominant right lower lobe nodule seen on most recent study has resolved no new or enlarging nodules  04/22/2019-CT chest without contrast- waxing and waning per   Pulmonary nodule 02/23/2012   RLL nodule-61mm stable 2006, April  2009, and June 2009 Small pulm nodules Jan 2012 - > Oct 2012 without change   Rheumatic heart disease mitral stenosis    mod MS by echo 02/2014   Right leg swelling 06/11/2020   R venous US  06/2020 WNL: - No evidence of deep vein thrombosis in the lower extremity. No indirect evidence of obstruction proximal to the inguinal ligament. - No cystic structure found in the popliteal fossa.   S/P aortic and mitral valve bioprostheses 12/08/2013   AVR 21 mm, MVR 25 mm-MagnaEase pericardia   S/P AVR (aortic valve replacement) 2015   bioprosthetic (Bartle)   S/P MVR (mitral valve replacement) 2015   bioprosthetic (Bartle)   Sepsis secondary to UTI (HCC) 08/22/2017   Severe aortic valve stenosis 10/01/2013   mild-mod by echo 02/2014   Skin lesion 01/03/2022   Small bowel obstruction, partial 08/2013   reolved without NGT placement.    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   Dr Amy [place]   Syncope 05/28/2018   Thrombocytopenia 11/04/2019   Typical atrial flutter 05/15/2019   Vitamin B12 deficiency 02/28/2015   Start B12 shots 02/2015    Vitamin D  deficiency 10/23/2016   Wounds, multiple    bilateral legs     Micro Data:  8/5: COVID/FLU/RSV PCR>> negative 8/5: Respiratory Viral Panel >> 8/5: Blood cultures x2>> 8/5: MRSA PCR>>  Antimicrobials:   Anti-infectives (From admission, onward)    None      Significant Hospital Events: Including procedures, antibiotic start and stop dates in addition to other pertinent events   8/5: Presents to ED with AMS, weakness, and hypoxia. Found to be hypercapnic, placed on BiPAP.  PCCM asked to admit in case she were to need vasopressors with diuresis.  Cardiology consulted.  Son confirms DNR status (most form at bedside), ok with short term intubation.  Prognosis is guarded. 8/6: Pt vomited with Bipap mask on, therefore Bipap removed.  Pt remains lethargic and hypercapnic.  ICU Intensivist Dr. Isaiah had goals of care conversation  with pts son and he decided to transition pt to Comfort Measures Only   Interim History / Subjective:  As outlined above under Significant Hospital Events section  Objective   Blood pressure (!) 110/53, pulse 86, temperature 99.2 F (37.3 C), temperature source Axillary, resp. rate 20, weight 73 kg, SpO2 96%.    FiO2 (%):  [35 %] 35 %   Intake/Output Summary (Last 24 hours) at 05/17/24 9166 Last data filed at 17-May-2024 9295 Gross per 24 hour  Intake 714.89 ml  Output 625 ml  Net 89.89 ml   Filed Weights   May 17, 2024 0500  Weight: 73 kg    Examination: General: Acute on chronically ill appearing obese female, mild respiratory distress on 2L O2 via nasal canula HENT: Atraumatic, normocephalic, neck supple, difficult to assess JVD due to body habitus  Lungs: Diminished with faint rhonchi  throughout, mild tachypnea  Cardiovascular: Paced rhythm, s1s2, m/r/g, 1+radial/1+distal pulses, trace generalized edema  Abdomen: +BS x4, soft, non tender, non distended,  Extremities: Chronic tropic changes with small ulcers to bilateral lower extremities (not acutely infected, see images below) Neuro: Lethargic but oriented x3, follows commands, PERRLA  GU: Indwelling foley catheter draining yellow urine  Skin: see below      Resolved Hospital Problem list     Assessment & Plan:  #Acute hypoxic hypercapnic respiratory failure  #Acute decompensated HFpEF #Left pleural effusion #Pulmonary hypertension #COPD without acute exacerbation  #Acute decompensated HFpEF, concern for possible RV failure #Mildly elevated troponin, suspect demand ischemia #Paroxsymal atrial fibrillation #Aortic and mitral valve replacement with biological prosthesis in 2016 #Severe mitral valve prosthesis stenosis #Moderate tricuspid valve regurgitation  #Acute kidney injury #Anemia without s/sx of bleeding #Chronic thrombocytopenia #Acute metabolic encephalopathy due to CO2 narcosis  - Pt critically ill with  multiorgan failure following goals of care discussions with pts son he states his mother told him yesterday she is done meaning she no longer wants aggressive treatment.  Pts son is now ready to transition pt to Comfort Measures Only.  Therefore, orders placed to transition to Comfort Measures Only. - Prn morphine  for pain management and air hunger - Prn glycopyrrolate  for excessive secretions  - Prn versed  and/or haldol  for agitation, anxiety, psychosis  - Prn zofran  for nausea/vomiting   Best Practice (right click and Reselect all SmartList Selections daily)  Diet/type: Regular diet  DVT prophylaxis: Other pt now CMO  GI prophylaxis: N/A Lines: N/A Foley:  Yes and still needed  Code Status:  DNR Last date of multidisciplinary goals of care discussion [05-28-24]  Labs   CBC: Recent Labs  Lab 05/14/24 1039 05/14/24 1518 2024/05/28 0228  WBC 11.1* 12.0* 11.9*  NEUTROABS 9.8* 10.6*  --   HGB 10.4* 10.8* 10.6*  HCT 34.9* 35.7* 34.8*  MCV 107.7* 109.8* 108.8*  PLT 132* 110* 175    Basic Metabolic Panel: Recent Labs  Lab 05/09/24 1243 05/14/24 1039 05/28/2024 0228  NA 139 136 137  K 4.6 4.0 4.4  CL 96 95* 95*  CO2 25 32 32  GLUCOSE 81 166* 133*  BUN 87* 90* 93*  CREATININE 1.82* 1.64* 1.74*  CALCIUM  8.9 8.8* 8.9  MG 2.2  --  2.2  PHOS  --   --  6.4*   GFR: Estimated Creatinine Clearance: 26.1 mL/min (A) (by C-G formula based on SCr of 1.74 mg/dL (H)). Recent Labs  Lab 05/14/24 1039 05/14/24 1518 May 28, 2024 0228  PROCALCITON <0.10  --   --   WBC 11.1* 12.0* 11.9*  LATICACIDVEN 1.0 1.4  --     Liver Function Tests: Recent Labs  Lab 05/14/24 1039 05/28/2024 0228  AST 31  --   ALT 14  --   ALKPHOS 85  --   BILITOT 0.6  --   PROT 6.5  --   ALBUMIN  2.6* 2.7*   No results for input(s): LIPASE, AMYLASE in the last 168 hours. No results for input(s): AMMONIA in the last 168 hours.  ABG    Component Value Date/Time   PHART 7.29 (L) 05-28-24 0520    PCO2ART 69 (HH) 05/28/2024 0520   PO2ART 171 (H) 05-28-24 0520   HCO3 33.2 (H) 2024/05/28 0520   TCO2 25 12/13/2013 1641   ACIDBASEDEF 2.0 12/12/2013 2354   O2SAT 98.8 May 28, 2024 0520     Coagulation Profile: Recent Labs  Lab 05/14/24 2206  INR 1.3*  Cardiac Enzymes: No results for input(s): CKTOTAL, CKMB, CKMBINDEX, TROPONINI in the last 168 hours.  HbA1C: Hgb A1c MFr Bld  Date/Time Value Ref Range Status  12/06/2013 02:00 PM 5.5 <5.7 % Final    Comment:    (NOTE)                                                                       According to the ADA Clinical Practice Recommendations for 2011, when HbA1c is used as a screening test:  >=6.5%   Diagnostic of Diabetes Mellitus           (if abnormal result is confirmed) 5.7-6.4%   Increased risk of developing Diabetes Mellitus References:Diagnosis and Classification of Diabetes Mellitus,Diabetes Care,2011,34(Suppl 1):S62-S69 and Standards of Medical Care in         Diabetes - 2011,Diabetes Care,2011,34 (Suppl 1):S11-S61.    CBG: Recent Labs  Lab 05/14/24 1426  GLUCAP 115*    Review of Systems:   Unable to assess due to AMS   Past Medical History:  She,  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 (12/31/2016), Acute cystitis without hematuria (12/22/2015), Acute right-sided low back pain without sciatica (11/26/2019), Adjustment disorder with depressed mood (10/15/2014), Anemia, Anticoagulant long-term use (06/14/2019), Aortic atherosclerosis (03/03/2022), Arthritis, Arthropathy of spinal facet joint (03/08/2018), Ascending aortic aneurysm (03/02/2021), Bilateral hip pain (09/08/2016), Cancer (HCC), CAP (community acquired pneumonia) (02/08/2016), Carotid stenosis (12/08/2020), Cataracts, bilateral, Chest pain (08/25/2014), Chronic cholecystitis with calculus (01/21/2015), Chronic diastolic heart failure (09/19/2014), Chronic insomnia, Chronic lower back pain (03/05/2008), CKD (chronic  kidney disease) stage 2, GFR 60-89 ml/min (07/03/2015), Constipation, COPD (chronic obstructive pulmonary disease), COVID-19 virus infection (11/11/2022), DDD (degenerative disc disease), Depression, DISH (diffuse idiopathic skeletal hyperostosis) (05/14/2019), Dyspnea on exertion (06/05/2015), Edema of both lower legs due to peripheral venous insufficiency, Essential hypertension, benign (03/24/2009), Heart murmur, Hematuria (06/17/2021), History of kidney stones, History of radiation therapy (05/30/11 to 07/07/11), History of rectal cancer (05/26/2011), History of shingles, History of vertebral fracture (11/26/2019), Hyperlipemia, Lacunar infarction, Left hip pain (07/29/2020), Legally blind in left eye, 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), Macrocytosis (03/27/2022), Malaise and fatigue (01/22/2018), Meralgia paresthetica (12/13/2007), 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), Obesity, Osteopenia (12/2014), Other spondylosis with radiculopathy, lumbar region (07/17/2018), Pain of right sacroiliac joint (08/09/2017), Pancreatitis (11/2014), Peripheral neuropathy (01/03/2022), Persistent atrial fibrillation (12/09/2020), Personal history of colonic polyps, PONV (postoperative nausea and vomiting), Positive occult stool blood test, Presence of permanent cardiac pacemaker, 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), 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), Pulmonary artery hypertension (06/17/2021), Pulmonary infiltrates (04/23/2019), Pulmonary nodule (02/23/2012), Rheumatic heart disease mitral stenosis, Right leg swelling (06/11/2020), S/P aortic and mitral valve bioprostheses (12/08/2013), S/P AVR (aortic valve replacement) (2015), S/P MVR (mitral valve replacement) (2015), Sepsis secondary to UTI (HCC) (08/22/2017), Severe aortic valve stenosis (10/01/2013), Skin lesion (01/03/2022), Small bowel obstruction, partial (08/2013), 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   Surgical History:   Past Surgical History:  Procedure Laterality Date   ANTERIOR LAT LUMBAR FUSION Left 07/17/2018   Procedure: Lumbar Two-Three Lumbar Three-Four Anterolateral decompression/interbody fusion with lateral plate fixation/Infuse;  Surgeon: Colon Shove, MD;  Location: [facility] OR;  Service: Neurosurgery;  Laterality: Left;  Lumbar Two-Three Lumbar Three-Four Anterolateral decompression/interbody fusion with lateral plate fixation/Infuse   AORTIC VALVE REPLACEMENT N/A 12/12/2013   Procedure: AORTIC VALVE REPLACEMENT (AVR);  Surgeon: Dorise MARLA Fellers, MD; Service: Open Heart Surgery   BACK SURGERY  2006, 2007   2006 SPACER, 2007 decompression and fusion L4/5   BOWEL RESECTION  04/02/2012   Procedure: SMALL BOWEL RESECTION;  Surgeon: Jina Nephew, MD;  Location: [facility] ORS;  Service: General;  Laterality: N/A;   BRONCHIAL WASHINGS  03/24/2021   Procedure: BRONCHIAL WASHINGS;  Surgeon: Geronimo Amel, MD;  Location: [facility] ENDOSCOPY;  Service: Endoscopy;;   CARDIOVERSION N/A 08/29/2023   Procedure: CARDIOVERSION (CATH LAB);  Surgeon: Pietro Redell RAMAN, MD;  Location: [facility] INVASIVE CV LAB;  Service: Cardiovascular;  Laterality: N/A;   CARPAL TUNNEL RELEASE Bilateral    CATARACT EXTRACTION W/ INTRAOCULAR LENS IMPLANT Left 10/18/2016   CATARACT EXTRACTION W/ INTRAOCULAR LENS IMPLANT Right 12/13/2016   Dr. Rosan   CHOLECYSTECTOMY N/A 01/21/2015   chronic cholecystitis, Jina Nephew, MD   COLON RESECTION  08/25/2011   Procedure: COLON RESECTION LAPAROSCOPIC;  Surgeon: Jina Nephew, MD;  Location: [facility];  Service: General;  Laterality: N/A;  Laparoscopic Assisted Low Anterior Resection Diverting Ostomy and onQ pain pump   COLONOSCOPY  03/2013   1 polyp, rpt 3 yrs Marianne)   COLONOSCOPY  05/2021   diverticulosis and  patent colon anastomosis with some edema/narrowing, rpt 5 yrs Marianne)   COLONOSCOPY WITH PROPOFOL  N/A 06/09/2016   patent colo-colonic anastomosis, rpt 5 yrs Marianne)   COLONOSCOPY WITH PROPOFOL  N/A 01/25/2024   TA, fair prep (consider repeat if needed), patent rectal anastomosis with congestion edema and stenosis, mild sigmoid diverticulosis Zane Groom, MD)   CYSTOSCOPY/URETEROSCOPY/HOLMIUM LASER/STENT PLACEMENT Right 07/03/2023   Procedure: CYSTOSCOPY RIGHT RETROGRADE PYEPLGRAM RIGHT URETEROSCOPY/HOLMIUM LASER/STENT PLACEMENT;  Surgeon: Carolee Sherwood JONETTA DOUGLAS, MD;  Location: [facility];  Service: Urology;  Laterality: Right;  1 HR FOR CASE   CYSTOSCOPY/URETEROSCOPY/HOLMIUM LASER/STENT PLACEMENT Right 07/17/2023   Procedure: CYSTOSCOPY/RIGHT URETEROSCOPY/STENT EXCHANGE;  Surgeon: Carolee Sherwood JONETTA DOUGLAS, MD;  Location: [facility];  Service: Urology;  Laterality: Right;  60 MINS FOR CASE   EP IMPLANTABLE DEVICE N/A 06/16/2016   Procedure: Pacemaker Implant;  Surgeon: Will Gladis Norton, MD;  Location: [facility] INVASIVE CV LAB;  Service: Cardiovascular;  Laterality: N/A;   ESOPHAGOGASTRODUODENOSCOPY (EGD) WITH PROPOFOL  N/A 01/25/2024   diffuse gastritis benign biopsy Zane, Groom, MD)   FOOT SURGERY  left foot   hammer toe   ILEOSTOMY  08/25/2011   ILEOSTOMY CLOSURE  04/02/2012   Procedure: ILEOSTOMY TAKEDOWN;  Surgeon: Jina Nephew, MD;  Location: [facility];  Service: General;  Laterality: N/A;   INTRAOPERATIVE TRANSESOPHAGEAL ECHOCARDIOGRAM N/A 12/12/2013   Procedure: INTRAOPERATIVE TRANSESOPHAGEAL ECHOCARDIOGRAM;  Surgeon: Dorise MARLA Fellers, MD;  Location: [facility];  Service: Open Heart Surgery;  Laterality: N/A;   LEFT AND RIGHT HEART CATHETERIZATION WITH CORONARY ANGIOGRAM N/A 11/27/2013   Procedure: LEFT AND RIGHT HEART CATHETERIZATION WITH CORONARY ANGIOGRAM;  Surgeon: [name], MD;  Location: [facility] CATH LAB;  Service: Cardiovascular;  Laterality: N/A;   MITRAL VALVE REPLACEMENT N/A 12/12/2013   Procedure:  MITRAL VALVE (MV) REPLACEMENT OR REPAIR;  Surgeon: Dorise MARLA Fellers, MD; Service: Open Heart Surgery   POLYPECTOMY  01/25/2024   Procedure: POLYPECTOMY, INTESTINE;  Surgeon: Charlanne Groom, MD;  Location: [facility];  Service: Gastroenterology;;   TEE WITHOUT CARDIOVERSION N/A 11/29/2013   Procedure: TRANSESOPHAGEAL ECHOCARDIOGRAM (TEE);  Surgeon: Wilbert  JONELLE Bihari, MD;  Location: [facility] ENDOS[facility];  Service: Cardiovascular;  Laterality: N/A;   TEE WITHOUT CARDIOVERSION N/A 06/04/2019   Procedure: TRANSESOPHAGEAL ECHOCARDIOGRAM (TEE);  Surgeon: Francyne Headland, MD;  Location: [facility] ENDOSCOPY;  Service: Cardiovascular;  Laterality: N/A;   TONSILLECTOMY     TRANSESOPHAGEAL ECHOCARDIOGRAM (CATH LAB) N/A 08/29/2023   Procedure: TRANSESOPHAGEAL ECHOCARDIOGRAM;  Surgeon: Pietro Redell RAMAN, MD;  Location: [facility] INVASIVE CV LAB;  Service: Cardiovascular;  Laterality: N/A;   VIDEO BRONCHOSCOPY N/A 03/24/2021   Procedure: VIDEO BRONCHOSCOPY WITHOUT FLUORO;  Surgeon: Geronimo Amel, MD;  Location: [facility] END[facility];  Service: Endoscopy;  Laterality: N/A;  SMALL SCOPE,PREMEDICATE WITH NEBULIZED LIDOCAINE      Social History:   reports that she quit smoking about 49 years ago. Her smoking use included cigarettes. She started smoking about 59 years ago. She has a 15 pack-year smoking history. She has never used smokeless tobacco. She reports that she does not drink alcohol  and does not use drugs.   Family History:  Her family history includes Breast cancer (age of onset: 54) in her maternal aunt; CAD (age of onset: 45) in her son; Diabetes in her mother; Heart attack in her maternal grandfather; Heart attack (age of onset: 19) in her son; Heart disease in her mother; Hypertension in her mother; Lung cancer in her father; Stroke in her mother. There is no history of Rectal cancer, Stomach cancer, Esophageal cancer, or Colon cancer.   Allergies Allergies  Allergen Reactions   Tape Other (See Comments)    THICK TAPES PULLS  OFF MY SKIN   Codeine Nausea Only   Prednisone  Nausea Only   Sulfa Antibiotics Nausea Only     Home Medications  Prior to Admission medications   Medication Sig Start Date End Date Taking? Authorizing Provider  cephALEXin  (KEFLEX ) 250 MG capsule Take 1 capsule (250 mg total) by mouth daily. 05/09/24  Yes Croitoru, Mihai, MD  acetaminophen  (TYLENOL ) 325 MG tablet Take 650 mg by mouth every 4 (four) hours as needed.    [provider]  acetaminophen  (TYLENOL ) 500 MG tablet Take 2 tablets (1,000 mg total) by mouth in the morning and at bedtime. 02/05/24   [name], MD  albuterol  (VENTOLIN  HFA) 108 [phone] Base) MCG/ACT inhaler Inhale 2 puffs into the lungs every 6 (six) hours as needed for wheezing. 06/05/23   Geronimo Amel, MD  buPROPion  (WELLBUTRIN  SR) 150 MG 12 hr tablet TAKE 1 TABLET(150 MG) BY MOUTH EVERY MORNING 03/07/24   [name], MD  cholecalciferol (VITAMIN D3) 25 MCG (1000 UNIT) tablet Take 1,000 Units by mouth daily.    [provider]  ELIQUIS  5 MG TABS tablet TAKE 1 TABLET(5 MG) BY MOUTH TWICE DAILY 03/25/24   Croitoru, Mihai, MD  ferrous sulfate  325 (65 FE) MG EC tablet Take 1 tablet (325 mg total) by mouth daily with breakfast. 09/06/23   Cloretta Arley NOVAK, MD  Fluticasone -Umeclidin-Vilant (TRELEGY ELLIPTA ) 200-62.5-25 MCG/ACT AEPB Inhale 1 puff into the lungs daily. 06/05/23   Geronimo Amel, MD  furosemide  (LASIX ) 20 MG tablet Take 20 mg by mouth 2 (two) times daily. Give with 40mg  to equal 60mg .  Give one tablet by mouth every 24 hours as needed for weight gain of 3lbs in 24hrs or 5lbs in one week give with 40mg  to equal 60mg  Patient taking differently: Give one tablet by mouth every 24 hours as needed for weight gain of 3lbs in 24hrs or 5lbs in one week give with 40mg  to equal 60mg     [provider]  furosemide  (LASIX ) 20 MG tablet Take 60 mg by mouth daily.    [provider]  gabapentin  (NEURONTIN ) 100 MG capsule Take 100 mg  by mouth 2 (two) times daily.    [provider]  gabapentin  (NEURONTIN ) 300 MG capsule Take 300 mg by mouth at bedtime.    [provider]  lovastatin  (MEVACOR ) 40 MG tablet TAKE 1 TABLET(40 MG) BY MOUTH AT BEDTIME 10/23/23   [name], MD  metolazone  (ZAROXOLYN ) 2.5 MG tablet Take 2.5 mg by mouth daily.    [provider]  metoprolol  succinate (TOPROL  XL) 25 MG 24 hr tablet Take 1 tablet (25 mg total) by mouth every evening. 09/11/23   Aniceto Daphne CROME, NP  montelukast  (SINGULAIR ) 10 MG tablet TAKE 1 TABLET(10 MG) BY MOUTH AT BEDTIME 03/25/24   Geronimo Amel, MD  Multiple Vitamins-Minerals (PRESERVISION AREDS 2 PO) Take 2 capsules by mouth in the morning.    [provider]  omeprazole  (PRILOSEC) 20 MG capsule Take 20 mg by mouth daily.    [provider]  potassium chloride  (KLOR-CON ) 10 MEQ tablet TAKE 2 TABLETS(20 MEQ) BY MOUTH DAILY 02/09/24   Croitoru, Mihai, MD  Sodium Hypochlorite (ANASEPT) 0.057 % LIQD Apply topically.    [provider]  traMADol  (ULTRAM ) 50 MG tablet Take 1 tablet (50 mg total) by mouth 2 (two) times daily as needed. 04/11/24   Caro Harlene POUR, NP  traZODone  (DESYREL ) 50 MG tablet Take 1 tablet (50 mg total) by mouth at bedtime. 03/29/24   [name], MD  Vibegron  (GEMTESA ) 75 MG TABS Take 75 mg by mouth daily.    [provider]  vitamin B-12 (CYANOCOBALAMIN ) 500 MCG tablet Take 500 mcg by mouth 2 (two) times a week.    [provider]  Zinc  50 MG CAPS Take 1 capsule (50 mg total) by mouth daily. Patient not taking: Reported on 04/30/2024 11/04/19   [name], MD     Critical care time: 32 minutes     Lonell Moose, AGNP  Pulmonary/Critical Care Pager [phone] (please enter 7 [phone]) PCCM Consult Pager [phone] (please enter 7 digits)

## 2024-06-10 NOTE — Progress Notes (Signed)
   May 25, 2024 0945  Spiritual Encounters  Type of Visit Initial  Care provided to: Pt and family (Pastor at bedside)  Referral source Other (comment) (On-Call from ICU nurse)  Reason for visit End-of-life  OnCall Visit No  Spiritual Framework  Presenting Themes Meaning/purpose/sources of inspiration;Values and beliefs;Community and relationships;Other (comment) ([employer] is w/Pt and Family)  Values/beliefs Family is [employer]  Interventions  Spiritual Care Interventions Made Established relationship of care and support;Compassionate presence;Reflective listening;Supported grief process;Other (comment) (For Family)  Intervention Outcomes  Outcomes Connection to spiritual care;Awareness around self/spiritual resourses;Awareness of support;Connected to spiritual community;Other (comment) (For Family)

## 2024-06-10 NOTE — Progress Notes (Signed)
 Called bedside as patient is actively vomiting with BIPAP mask on.  - BIPAP removed - STAT ABG shows only slightly improved respiratory acidosis even after wearing BIPAP support all night: 7.29/ 69/ 171/ 33.2  Called and spoke with the patient's son Adonica Fukushima, updating him on his mother's current clinical status. We discussed that at this point the recommendation would by mechanical intubation and ventilatory support. However, due to the patient's significant cardiac history, including RV failure, she is at a high risk of cardiac arrest with intubation. We also discussed that with her co-morbidities it would be very difficult to extubate her in a short time frame as she has previously expressed would be her wishes.  Mr. Irigoyen did not want to proceed with mechanical intubation at this time. The patient is currently oxygenating with an SpO2 > 95% on [place]. We will wait, repeat a VBG at 8 am and by that time Mr. Pinales should be at the hospital to see his mother and discuss with the day team the best course of action. We discussed that it is highly unlikely the blood gas will improve. Mr. Sallie is in agreement with this plan. All questions and concerns have been answered at this time. CODE status remains the same.   Jenita Ruth Rust-[place], AGACNP-BC Acute Care Nurse Practitioner  Pulmonary & Critical Care   [phone] / [phone] Please see Amion for details.

## 2024-06-10 NOTE — Death Summary Note (Signed)
 DEATH SUMMARY   Patient Details  Name: [name] MRN: [id number] DOB: 1945/11/28  Admission/Discharge Information   Admit Date:  06-09-24  Date of Death: Date of Death: 10-Jun-2024  Time of Death: Time of Death: 14-Jun-1109  Length of Stay: 1  Referring Physician: [name], MD   Reason(s) for Hospitalization  Weakness and Altered Mental Status   Diagnoses  Preliminary cause of death: Acute decompensated heart failure (HCC) Secondary Diagnoses (including complications and co-morbidities):  Principal Problem:   Acute on chronic respiratory failure with hypoxia and hypercapnia (HCC)   Acute hypoxic hypercapnic respiratory failure    Left pleural effusion   Severe Pulmonary hypertension   COPD without acute exacerbation    Acute decompensated HFpEF, concern for possible RV failure   Mildly elevated troponin, suspect demand ischemia   Paroxsymal atrial fibrillation   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 Fib, and COPD admitted with Acute Hypoxic and Hypercapnic Respiratory Failure and Acute Metabolic Encephalopathy due to Acute Decompensated HFpEF (also with concern for RV failure) requiring BiPAP, along with AKI. See detailed hospital course below under significant events.    Significant Hospital Events:  June 09, 2024: Presents to ED with AMS, weakness, and hypoxia. Found to be hypercapnic, placed on  BiPAP.  PCCM asked to admit in case she were to need vasopressors with diuresis.  Cardiology consulted.  Son confirms DNR status (most form at bedside), ok with short term intubation.  Prognosis is guarded. 2024/06/10: Pt vomited with Bipap mask on, therefore Bipap removed.  Pt remains lethargic and hypercapnic.  ICU Intensivist Dr. Isaiah had goals of care conversation with pts son and he decided to transition pt to Comfort Measures Only.  Pt expired on 06/10/2024 at 11:10 am with family at bedside.    Pertinent Labs and Studies  Significant Diagnostic Studies ECHOCARDIOGRAM COMPLETE Result Date: 06/10/24    ECHOCARDIOGRAM REPORT   Patient Name:   [name] Date of Exam: 06/09/24 Medical Rec #:  [id number]            Height:       64.0 in Accession #:    [id number]           Weight:       165.6 lb Date of Birth:  07/19/1946            BSA:          1.806 m Patient Age:    83 years             BP:           101/52 mmHg Patient Gender: F                    HR:           88 bpm. Exam Location:  [facility] Procedure: 2D Echo, Cardiac Doppler and Color Doppler (Both Spectral and Color  Flow Doppler were utilized during procedure). Indications:     I50.21 Acute Systolic CHF  History:         Patient has prior history of Echocardiogram examinations, most                  recent 04/04/2024. AVR-21 mm Magna Ease. MVR-25 mm                  Bioprosthetic. and Pacemaker, COPD, Arrythmias:Atrial                  Fibrillation; Signs/Symptoms:Chest Pain, Murmur, Syncope and                  Dyspnea. Lower extremity edema. Ascending Aorta Aneurysm.  Sonographer:     Carl Coma RDCS Referring Phys:  [id number] INGE JONETTA LECHER Diagnosing Phys: Lonni Hanson MD  Sonographer Comments: Technically difficult study due to poor echo windows. IMPRESSIONS  1. Left ventricular ejection fraction, by estimation, is 65 to 70%. The left ventricle has normal function. The left ventricle has no regional wall motion  abnormalities. There is moderate left ventricular hypertrophy. Left ventricular diastolic function  could not be evaluated.  2. Right ventricular systolic function is moderately reduced. The right ventricular size is moderately enlarged. There is moderately elevated pulmonary artery systolic pressure. The estimated right ventricular systolic pressure is 53.7 mmHg.  3. Left atrial size was moderately dilated.  4. The mitral valve has been repaired/replaced. No evidence of mitral valve regurgitation. Severe mitral stenosis. The mean mitral valve gradient is 16.0 mmHg. Though suboptimally visualized, the mitral valve leaflets appear thickened wit reduced excursion.  5. Moderate pericardial effusion. There is no evidence of cardiac tamponade. Moderate pleural effusion in the left lateral region.  6. Tricuspid valve regurgitation is mild to moderate.  7. The aortic valve has been repaired/replaced. Aortic valve regurgitation is trivial. Aortic valve mean gradient measures 14.5 mmHg.  8. There is mild dilatation of the ascending aorta, measuring 43 mm.  9. The inferior vena cava is normal in size with <50% respiratory variability, suggesting right atrial pressure of 8 mmHg. Comparison(s): A prior study was performed on 04/03/2024. Moderate-sized pericardial effusion is now present. FINDINGS  Left Ventricle: Left ventricular ejection fraction, by estimation, is 65 to 70%. The left ventricle has normal function. The left ventricle has no regional wall motion abnormalities. The left ventricular internal cavity size was normal in size. There is  moderate left ventricular hypertrophy. Left ventricular diastolic function could not be evaluated due to mitral valve replacement. Left ventricular diastolic function could not be evaluated. Right Ventricle: The right ventricular size is moderately enlarged. No increase in right ventricular wall thickness. Right ventricular systolic function is moderately reduced. There is moderately  elevated pulmonary artery systolic pressure. The tricuspid  regurgitant velocity is 3.38 m/s, and with an assumed right atrial pressure of 8 mmHg, the estimated right ventricular systolic pressure is 53.7 mmHg. Left Atrium: Left atrial size was moderately dilated. Right Atrium: Right atrial size was normal in size. Pericardium: A moderately sized pericardial effusion is present. There is no evidence of cardiac tamponade. The mitral valve has been repaired/replaced. No evidence of mitral valve regurgitation. Severe mitral valve stenosis. Tricuspid Valve: The tricuspid valve is normal in structure. Tricuspid valve regurgitation is mild to moderate. The aortic valve has been repaired/replaced. Aortic valve regurgitation is trivial. Pulmonic Valve: The pulmonic valve was grossly normal. Pulmonic valve regurgitation is mild. No evidence of pulmonic stenosis. Aorta: The aortic  root is normal in size and structure. There is mild dilatation of the ascending aorta, measuring 43 mm. Pulmonary Artery: The pulmonary artery is of normal size. Venous: The inferior vena cava is normal in size with less than 50% respiratory variability, suggesting right atrial pressure of 8 mmHg. IAS/Shunts: The interatrial septum was not well visualized. Additional Comments: A device lead is visualized in the right ventricle. There is a moderate pleural effusion in the left lateral region.  LEFT VENTRICLE PLAX 2D LVIDd:         2.90 cm Diastology LVIDs:         1.95 cm LV e' medial:    7.07 cm/s LV PW:         1.50 cm LV E/e' medial:  27.6 LV IVS:        1.50 cm LV e' lateral:   6.71 cm/s                        LV E/e' lateral: 29.1  RIGHT VENTRICLE            IVC RV Basal diam:  4.50 cm    IVC diam: 1.10 cm RV S prime:     5.98 cm/s TAPSE (M-mode): 1.8 cm LEFT ATRIUM             Index        RIGHT ATRIUM           Index LA diam:        4.50 cm 2.49 cm/m   RA Area:     14.80 cm LA Vol (A2C):   94.3 ml 52.23 ml/m  RA Volume:   38.50 ml  21.32  ml/m LA Vol (A4C):   72.5 ml 40.15 ml/m LA Biplane Vol: 87.5 ml 48.46 ml/m  AORTIC VALVE AV Vmax:           261.25 cm/s AV Vmean:          173.250 cm/s AV VTI:            0.379 m AV Peak Grad:      27.3 mmHg AV Mean Grad:      14.5 mmHg LVOT Vmax:         103.40 cm/s LVOT Vmean:        68.267 cm/s LVOT VTI:          0.171 m LVOT/AV VTI ratio: 0.45  AORTA Ao Root diam: 3.70 cm Ao Asc diam:  4.30 cm MITRAL VALVE                TRICUSPID VALVE MV Area (PHT): 5.38 cm     TR Peak grad:   45.7 mmHg MV Peak grad:  30.5 mmHg    TR Vmax:        338.00 cm/s MV Mean grad:  16.0 mmHg MV Vmax:       2.76 m/s     SHUNTS MV Vmean:      185.3 cm/s   Systemic VTI: 0.17 m MV Decel Time: 141 msec MV E velocity: 195.00 cm/s MV A velocity: 164.00 cm/s MV E/A ratio:  1.19 Lonni Hanson MD Electronically signed by Lonni Hanson MD Signature Date/Time: 05-Jun-2024/7:22:42 AM    Final (Updated)    DG Chest Port 1 View Result Date: 2024-06-05 CLINICAL DATA:  [id number]. [id number]. Hypoxic respiratory failure and pleural effusion.  Hypercarbia. EXAM: PORTABLE CHEST 1 VIEW COMPARISON:  Portable chest yesterday at 11:15 a.m. FINDINGS: 4:48 a.m. There  are intact sternotomy sutures and two prosthetic heart valves superimposing over each other. Left chest dual lead pacing system and wire insertions are unaltered. There is a stable small right and moderate left bilateral pleural effusions, consolidation or atelectasis overlying the left effusion and patchy consolidation increasing in the base of the right lung. The lungs elsewhere demonstrate emphysematous and chronic changes with asymmetric right apical scarring and no other new findings. There is stable cardiomegaly without overt CHF. The mediastinum is stable with aortic atherosclerosis. No new osseous finding. IMPRESSION: 1. Increasing patchy consolidation in the base of the right lung. 2. Stable small right and moderate left pleural effusions and consolidation or atelectasis overlying the  left effusion. 3. Stable cardiomegaly without overt CHF. 4. Emphysema and chronic changes. 5. Aortic atherosclerosis. Electronically Signed   By: Francis Quam [name]   On: 13-Jun-2024 06:27   DG Chest Port 1 View Result Date: 05/14/2024 CLINICAL DATA:  Questionable sepsis - evaluate for abnormality Decreased level of consciousness.  Hypoxemia on supplemental oxygen. EXAM: PORTABLE CHEST 1 VIEW COMPARISON:  Radiographs 05/09/2024 and 04/06/2024.  CT 03/19/2024. FINDINGS: 1115 hours. The lung apices remain partially obscured by the patient's mandible. Left subclavian pacemaker leads appear unchanged, projecting over the right atrium and right ventricle. The heart size and mediastinal contours are stable post median sternotomy and dual valve replacement. Moderate-sized left pleural effusion has enlarged compared with the previous study with resulting increased left basilar airspace disease. Stable chronic right apical scarring. No evidence of pneumothorax or acute osseous abnormality. IMPRESSION: Interval enlargement of left pleural effusion with increased left basilar airspace disease, likely atelectasis. No other significant changes. Electronically Signed   By: Elsie Perone [name]   On: 05/14/2024 12:32   DG Chest 2 View Result Date: 05/09/2024 CLINICAL DATA:  Pleural few pain. EXAM: CHEST - 2 VIEW COMPARISON:  Chest radiograph dated 04/06/2024. FINDINGS: Small left pleural effusion and left lung base atelectasis or infiltrate similar to prior radiograph. Minimal right lung base pleural effusion suspected. No pneumothorax. Stable cardiac silhouette. Median sternotomy wires. Left pectoral pacemaker device. No acute osseous pathology. IMPRESSION: No interval change. Electronically Signed   By: Vanetta Chou [name]   On: 05/09/2024 12:38    Microbiology Recent Results (from the past 240 hours)  Blood Culture (routine x 2)     Status: None (Preliminary result)   Collection Time: 05/14/24 10:39 AM   Specimen:  BLOOD  Result Value Ref Range Status   Specimen Description BLOOD BLOOD RIGHT HAND  Final   Special Requests   Final    BOTTLES DRAWN AEROBIC AND ANAEROBIC Blood Culture results may not be optimal due to an inadequate volume of blood received in culture bottles   Culture   Final    NO GROWTH < 24 HOURS Performed at [facility], [address], [place], [place] [address]    Report Status PENDING  Incomplete  Blood Culture (routine x 2)     Status: None (Preliminary result)   Collection Time: 05/14/24 10:39 AM   Specimen: BLOOD  Result Value Ref Range Status   Specimen Description BLOOD BLOOD LEFT FOREARM  Final   Special Requests   Final    BOTTLES DRAWN AEROBIC AND ANAEROBIC Blood Culture results may not be optimal due to an inadequate volume of blood received in culture bottles   Culture   Final    NO GROWTH < 24 HOURS Performed at [facility], [address], [place], [place] [address]  Report Status PENDING  Incomplete  Resp panel by RT-PCR (RSV, Flu A&B, Covid) Anterior Nasal Swab     Status: None   Collection Time: 05/14/24 10:45 AM   Specimen: Anterior Nasal Swab  Result Value Ref Range Status   SARS Coronavirus 2 by RT PCR NEGATIVE NEGATIVE Final    Comment: (NOTE) SARS-CoV-2 target nucleic acids are NOT DETECTED.  The SARS-CoV-2 RNA is generally detectable in upper respiratory specimens during the acute phase of infection. The lowest concentration of SARS-CoV-2 viral copies this assay can detect is 138 copies/mL. A negative result does not preclude SARS-Cov-2 infection and should not be used as the sole basis for treatment or other patient management decisions. A negative result may occur with  improper specimen collection/handling, submission of specimen other than nasopharyngeal swab, presence of viral mutation(s) within the areas targeted by this assay, and inadequate number of viral copies(<138 copies/mL). A negative result must be combined  with clinical observations, patient history, and epidemiological information. The expected result is Negative.  Fact Sheet for Patients:  BloggerCourse.com  Fact Sheet for Healthcare Providers:  SeriousBroker.it  This test is no t yet approved or cleared by the [place]  FDA and  has been authorized for detection and/or diagnosis of SARS-CoV-2 by FDA under an Emergency Use Authorization (EUA). This EUA will remain  in effect (meaning this test can be used) for the duration of the COVID-19 declaration under Section 564(b)(1) of the Act, 21 U.S.C.section 360bbb-3(b)(1), unless the authorization is terminated  or revoked sooner.       Influenza A by PCR NEGATIVE NEGATIVE Final   Influenza B by PCR NEGATIVE NEGATIVE Final    Comment: (NOTE) The Xpert Xpress SARS-CoV-2/FLU/RSV plus assay is intended as an aid in the diagnosis of influenza from Nasopharyngeal swab specimens and should not be used as a sole basis for treatment. Nasal washings and aspirates are unacceptable for Xpert Xpress SARS-CoV-2/FLU/RSV testing.  Fact Sheet for Patients: BloggerCourse.com  Fact Sheet for Healthcare Providers: SeriousBroker.it  This test is not yet approved or cleared by the [place]  FDA and has been authorized for detection and/or diagnosis of SARS-CoV-2 by FDA under an Emergency Use Authorization (EUA). This EUA will remain in effect (meaning this test can be used) for the duration of the COVID-19 declaration under Section 564(b)(1) of the Act, 21 U.S.C. section 360bbb-3(b)(1), unless the authorization is terminated or revoked.     Resp Syncytial Virus by PCR NEGATIVE NEGATIVE Final    Comment: (NOTE) Fact Sheet for Patients: BloggerCourse.com  Fact Sheet for Healthcare Providers: SeriousBroker.it  This test is not yet approved  or cleared by the [place]  FDA and has been authorized for detection and/or diagnosis of SARS-CoV-2 by FDA under an Emergency Use Authorization (EUA). This EUA will remain in effect (meaning this test can be used) for the duration of the COVID-19 declaration under Section 564(b)(1) of the Act, 21 U.S.C. section 360bbb-3(b)(1), unless the authorization is terminated or revoked.  Performed at [facility], [address] [place], [place] [address]   MRSA Next Gen by PCR, Nasal     Status: None   Collection Time: 05/14/24  2:41 PM   Specimen: Nasal Mucosa; Nasal Swab  Result Value Ref Range Status   MRSA by PCR Next Gen NOT DETECTED NOT DETECTED Final    Comment: (NOTE) The GeneXpert MRSA Assay (FDA approved for NASAL specimens only), is one component of a comprehensive MRSA colonization surveillance program. It is not intended to  diagnose MRSA infection nor to guide or monitor treatment for MRSA infections. Test performance is not FDA approved in patients less than 81 years old. Performed at [facility], [address] [place], [place] [address]     Lab Basic Metabolic Panel: Recent Labs  Lab 05/09/24 1243 05/14/24 1039 06-01-2024 0228  NA 139 136 137  K 4.6 4.0 4.4  CL 96 95* 95*  CO2 25 32 32  GLUCOSE 81 166* 133*  BUN 87* 90* 93*  CREATININE 1.82* 1.64* 1.74*  CALCIUM  8.9 8.8* 8.9  MG 2.2  --  2.2  PHOS  --   --  6.4*   Liver Function Tests: Recent Labs  Lab 05/14/24 1039 Jun 01, 2024 0228  AST 31  --   ALT 14  --   ALKPHOS 85  --   BILITOT 0.6  --   PROT 6.5  --   ALBUMIN  2.6* 2.7*   No results for input(s): LIPASE, AMYLASE in the last 168 hours. No results for input(s): AMMONIA in the last 168 hours. CBC: Recent Labs  Lab 05/14/24 1039 05/14/24 1518 06-01-2024 0228  WBC 11.1* 12.0* 11.9*  NEUTROABS 9.8* 10.6*  --   HGB 10.4* 10.8* 10.6*  HCT 34.9* 35.7* 34.8*  MCV 107.7* 109.8* 108.8*  PLT 132* 110* 175   Cardiac  Enzymes: No results for input(s): CKTOTAL, CKMB, CKMBINDEX, TROPONINI in the last 168 hours. Sepsis Labs: Recent Labs  Lab 05/14/24 1039 05/14/24 1518 2024/06/01 0228  PROCALCITON <0.10  --   --   WBC 11.1* 12.0* 11.9*  LATICACIDVEN 1.0 1.4  --     Procedures/Operations  None   Lonell Moose, AGNP  Pulmonary/Critical Care Pager [phone] (please enter 7 digits) PCCM Consult Pager [phone] (please enter 7 digits)

## 2024-06-10 NOTE — Consult Note (Signed)
 PHARMACY - ANTICOAGULATION CONSULT NOTE  Pharmacy Consult for Heparin   Indication: prosthetic heart valves, A. fib  Allergies  Allergen Reactions   Tape Other (See Comments)    THICK TAPES PULLS OFF MY SKIN   Codeine Nausea Only   Prednisone  Nausea Only   Sulfa Antibiotics Nausea Only    Patient Measurements:    Vital Signs: Temp: 99.2 F (37.3 C) (08/05 2000) Temp Source: Axillary (08/05 2000) BP: 105/50 2024/06/14 0200) Pulse Rate: 91 2024-06-14 0200)  Labs: Recent Labs    05/14/24 1039 05/14/24 1329 05/14/24 1518 05/14/24 2206 2024-06-14 0228  HGB 10.4*  --  10.8*  --  10.6*  HCT 34.9*  --  35.7*  --  34.8*  PLT 132*  --  110*  --  175  APTT  --   --   --  107* 139*  LABPROT  --   --   --  17.0*  --   INR  --   --   --  1.3*  --   HEPARINUNFRC  --   --   --  >1.10*  --   CREATININE 1.64*  --   --   --  1.74*  TROPONINIHS 68* 78*  --   --   --     Estimated Creatinine Clearance: 26.5 mL/min (A) (by C-G formula based on SCr of 1.74 mg/dL (H)).   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 Known DISH with thoracic spine fusion and moderate kyphosis, h/o L spine fusion L2-5   Adjustment disorder with depressed mood 11-14-14   81 yo son died MI 2014/10/18 Husband died car accident 2015/04/19   Anemia    Anticoagulant long-term use 06/14/2019   She takes warfarin for afib/flutter with CHADS2VASc score of 4 (age, sex, CHF, HTN), s/p 2 bioprosthetic valve replacements Coumadin  changed to eliquis  2022 per cardiology   Aortic atherosclerosis 03/03/2022   Noted on 01/2022 chest CT   Arthritis    Arthropathy of spinal facet joint 03/08/2018   Ascending aortic aneurysm 03/02/2021   4.1cm on CT 01/2021 rec yearly monitoring   Bilateral hip  pain 09/08/2016   Cancer (HCC)    conon cancer 2012   CAP (community acquired pneumonia) 02/08/2016   Carotid stenosis 12/08/2020   Carotid US  - 40-59% BICA stenosis (2015) Carotid US  12/2020 - 1-39% BICA stenosis, incidental 3.4cm structure behind L common carotid artery rec further imaging    Cataracts, bilateral    immature   Chest pain 08/25/2014   myoview  low risk 02/2018 ([place])   Chronic cholecystitis with calculus 01/21/2015   S/p cholecystectomy    Chronic diastolic heart failure 09/19/2014   Chronic insomnia    Chronic lower back pain 03/05/2008   Returned to Dr Colon - translaminar ESI at L3/4. Myelogram showed significant left sided foraminal stenosis at L2/3 and L3/4 in setting of solid arthrodesis, planned DEXA and if stable osteopenia, considering anterolateral decompression with spacer.    CKD (chronic kidney disease) stage 2, GFR 60-89 ml/min 07/03/2015   Constipation    takes Miralax  daily as needed   COPD (chronic obstructive pulmonary disease)    Albuterol  inhaler daily as needed;Duoneb daily as needed;Spiriva  daily   COVID-19 virus infection 11/11/2022   DDD (degenerative disc disease)    cervical -  kyphosis with mod DD changes C5/6 and C6/7; lumbar - early DD at L2/3 (Elsner)   Depression    DISH (diffuse idiopathic skeletal hyperostosis) 05/14/2019   By MRI noted fusion of lower thoracic vertebrae - likely contributing to unsteadiness (Elsner)   Dyspnea on exertion 06/05/2015   myoview  low risk 02/2018   Edema of both lower legs due to peripheral venous insufficiency    Essential hypertension, benign 03/24/2009   hx of not on nmeds currenlty and has been a while since on meds per pt   Heart murmur    Hematuria 06/17/2021   History of kidney stones    History of radiation therapy 05/30/11 to 07/07/11   rectum   History of rectal cancer 05/26/2011   Rectal cancer 2012, midrectum [id number], s/p neoadj chemo&XRT, lap LAR with diverting loop ileostomy 08/2011,  ileostomy takedown 03/2012 Discharged from oncology clinic 2017 [place])   History of shingles    History of vertebral fracture 11/26/2019   Old L1 vertebral compression fracture incidentally noted on imaging 2020    Hyperlipemia    takes Lovastatin  daily   Lacunar infarction    Left caudate   Left hip pain 07/29/2020   Legally blind in left eye    Lymphangioma 01/07/2021   Noted incidentally on carotid US  12/2020 CT neck - Likely benign lymphangioma 01/2021   Macrocytosis 03/27/2022   Folate and b12 checked 2023   Malaise and fatigue 01/22/2018   Meralgia paresthetica 12/13/2007   Mixed stress and urge urinary incontinence 03/16/2015   Failed oxybutynin  IR/ER. Vesicare  initially helped but then no longer helpful.  myrbetriq  was not effective.  Saw urology (MacDiarmid) - UDS largely overactive bladder with mild-mod stress incontinence. Improved on abx course - maintained on macrodantin  and toviaz .    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   Pulmonary infiltrates  04/23/2019   06/13/2018-CT super D chest without contrast- generally stable chronic lung disease with pleural parenchymal scarring and scattered nodularity most of these nodules have been tree-in-bud in appearance likely postinfectious or inflammatory the dominant right lower lobe nodule seen on most recent study has resolved no new or enlarging nodules  04/22/2019-CT chest without contrast- waxing and waning per   Pulmonary nodule 02/23/2012   RLL nodule-57mm stable 2006, April 2009, and June 2009 Small pulm nodules Jan 2012 - > Oct 2012 without change   Rheumatic heart disease mitral stenosis    mod MS by echo 02/2014   Right leg swelling 06/11/2020   R venous US  06/2020 WNL: - No evidence of deep vein thrombosis in the lower extremity. No indirect evidence of obstruction  proximal to the inguinal ligament. - No cystic structure found in the popliteal fossa.   S/P aortic and mitral valve bioprostheses 12/08/2013   AVR 21 mm, MVR 25 mm-MagnaEase pericardia   S/P AVR (aortic valve replacement) 2015   bioprosthetic (Bartle)   S/P MVR (mitral valve replacement) 2015   bioprosthetic (Bartle)   Sepsis secondary to UTI (HCC) 08/22/2017   Severe aortic valve stenosis 10/01/2013   mild-mod by echo 02/2014   Skin lesion 01/03/2022   Small bowel obstruction, partial 08/2013   reolved without NGT placement.    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   Unknown   albuterol  (VENTOLIN  HFA) 108 (90 Base) MCG/ACT inhaler Inhale 2 puffs into the  lungs every 6 (six) hours as needed for wheezing. 18 g 6 Unknown   buPROPion  (WELLBUTRIN  SR) 150 MG 12 hr tablet TAKE 1 TABLET(150 MG) BY MOUTH EVERY MORNING 90 tablet 1 05/14/2024 Morning   cephALEXin  (KEFLEX ) 250 MG capsule Take 1 capsule (250 mg total) by mouth daily. 90 capsule 3 05/14/2024   cholecalciferol (VITAMIN D3) 25 MCG (1000 UNIT) tablet Take 1,000 Units by mouth daily.   05/14/2024 Morning   ELIQUIS  5 MG TABS tablet TAKE 1 TABLET(5 MG) BY MOUTH TWICE DAILY 180 tablet 1 05/14/2024 Morning   ferrous sulfate  325 (65 FE) MG EC tablet Take 1 tablet (325 mg total) by mouth daily with breakfast.   05/14/2024 Morning   Fluticasone -Umeclidin-Vilant (TRELEGY ELLIPTA ) 200-62.5-25 MCG/ACT AEPB Inhale 1 puff into the lungs daily. 60 each 11 05/14/2024 Morning   furosemide  (LASIX ) 20 MG tablet Take 20 mg by mouth 2 (two) times daily. Give with 40mg  to equal 60mg .  Give one tablet by mouth every 24 hours as needed for weight gain of 3lbs in 24hrs or 5lbs in one week give with 40mg  to equal 60mg  (Patient taking differently: Give one tablet by mouth every 24 hours as needed for weight gain of 3lbs in 24hrs or 5lbs in one week give with 40mg  to equal 60mg )   05/14/2024 Morning   gabapentin  (NEURONTIN ) 300 MG capsule Take 300 mg by mouth at bedtime.   05/13/2024 Evening   lovastatin  (MEVACOR ) 40 MG tablet TAKE 1 TABLET(40 MG) BY MOUTH AT BEDTIME 90 tablet 2 05/13/2024 Evening   metolazone  (ZAROXOLYN ) 2.5 MG tablet Take 2.5 mg by mouth daily.   05/14/2024   metoprolol  succinate (TOPROL  XL) 25 MG 24 hr tablet Take 1 tablet (25 mg total) by mouth every evening. 90 tablet 3 05/13/2024 Evening   montelukast  (SINGULAIR ) 10 MG tablet TAKE 1 TABLET(10 MG) BY MOUTH AT BEDTIME 90 tablet 1 05/13/2024 Evening   Multiple Vitamins-Minerals (PRESERVISION AREDS 2 PO) Take 2 capsules by mouth in the morning.   05/14/2024 Morning   omeprazole  (PRILOSEC) 20 MG capsule Take 20 mg by mouth daily.   05/14/2024 Morning   ondansetron  (ZOFRAN ) 4 MG tablet  Take 4 mg by mouth every 6 (six) hours as needed for nausea or vomiting.  Unknown   potassium chloride  (KLOR-CON ) 10 MEQ tablet TAKE 2 TABLETS(20 MEQ) BY MOUTH DAILY 180 tablet 1 05/14/2024 Morning   traMADol  (ULTRAM ) 50 MG tablet Take 1 tablet (50 mg total) by mouth 2 (two) times daily as needed. 60 tablet 0 05/13/2024 Evening   traZODone  (DESYREL ) 50 MG tablet Take 1 tablet (50 mg total) by mouth at bedtime.   05/13/2024 Evening   Vibegron  (GEMTESA ) 75 MG TABS Take 75 mg by mouth daily.   05/14/2024   vitamin B-12 (CYANOCOBALAMIN ) 500 MCG tablet Take 500 mcg by mouth 2 (two) times a week.   05/11/2024   furosemide  (LASIX ) 20 MG tablet Take 60 mg by mouth daily.      Sodium Hypochlorite (ANASEPT) 0.057 % LIQD Apply topically. (Patient not taking: Reported on 05/14/2024)   Not Taking   Zinc  50 MG CAPS Take 1 capsule (50 mg total) by mouth daily. (Patient not taking: No sig reported)  0 Not Taking   Scheduled:   Chlorhexidine  Gluconate Cloth  6 each Topical Daily   Gerhardt's butt cream   Topical TID   Infusions:   sodium chloride      heparin  1,050 Units/hr (2024-06-10 0235)   norepinephrine  (LEVOPHED ) Adult infusion 6 mcg/min (10-Jun-2024 0235)   PRN: docusate sodium , ipratropium-albuterol , polyethylene glycol Anti-infectives (From admission, onward)    None       Assessment: 78 y.o. female with a hx of chronic diastolic heart failure, rheumatic valvular disease, history of aortic and mitral valve replacement in 2015, COPD, hyperlipidemia, AV block s/p dual-chamber pacemaker, atrial flutter, atrial fibrillation, and mild ascending aortic aneurysm. Pt takes apixaban  PTA. Will use aPTT to monitor heparin . Baseline labs ordered. Hx of some thrombocytopenia. Hgb at baseline. Last dose of heparin  sq 1702.    Goal of Therapy:  Heparin  level 0.3-0.7 units/ml once aPTT and heparin  level correlate.  aPTT 66-102 seconds Monitor platelets by anticoagulation protocol: Yes  2024-06-10 0228 aPTT 139,  supratherapeutic   Plan:  Decrease heparin  infusion to 800 units/hr Recheck aPTT level in 8 hours after rate change and daily CBC and heparin  level while on heparin  Continue to monitor H&H and platelets  Rankin CANDIE Dills, PharmD, [facility] 10-Jun-2024 3:11 AM

## 2024-06-10 NOTE — Progress Notes (Signed)
                                                     Palliative Care Progress Note, Assessment & Plan   Patient Name: Pamula Mliss Wedin       Date: 06/03/2024 DOB: [birth year]  Age: 78 y.o. MRN#: [id number] Attending Physician: Isaiah Scrivener, MD Primary Care Physician: [name], MD Admit Date: 05/14/2024  Subjective: Patient sitting up in bed, resting in no apparent distress.  She is pale in color.  Her son and other family members are at bedside.  HPI: 78 y.o. female  with past medical history of persistent A-fib, history of aortic valve and mitral valve replacement with bioprosthetic valve, ascending aorta dilatation, AKI, HFpEF admitted [facility] on [facility]/14/[facility]24 with AMS, weakness, and hypoxia.   Patient is being treated for acute hypoxic and hypercapnic respiratory failure due to acute decompensated HFpEF, left pleural effusion, pulmonary HTN, and COPD without acute exacerbation.   Cardiology consulted.   Consult to IR for evaluation for thoracentesis.    PMT was consulted to support patient and family with goals of care discussions.  8/5 -overnight, patient vomited into BiPAP machine.  Patient's son declined intubation.  Earlier this morning, CCM discussed with family and patient was shifted to full comfort measures.  Summary of counseling/coordination of care: Extensive chart review completed prior to meeting patient including labs, vital signs, imaging, progress notes, orders, and available advanced directive documents from current and previous encounters.   After reviewing the patient's chart and assessing the patient at bedside, I spoke with patient's family at bedside.  Therapeutic silence, active listening, and emotional support provided.  Patient son shares that he is happy to see her more comfortable.  He shares that he knows he  does not have well with her.  Family at bedside shares that they have no needs right now but appreciates the support.  PMT remains available to patient and family throughout her hospitalization.  Full comfort measures continue.  I anticipate an in-hospital death.  Physical Exam Vitals reviewed.  Constitutional:      Appearance: She is ill-appearing.  HENT:     Head: Normocephalic.  Cardiovascular:     Rate and Rhythm: Bradycardia present.  Pulmonary:     Effort: No respiratory distress.  Skin:    Coloration: Skin is cyanotic and pale.  Psychiatric:        Mood and Affect: Mood is not anxious.        Behavior: Behavior is not agitated.             Total Time 25 minutes   Time spent includes: Detailed review of medical records (labs, imaging, vital signs), medically appropriate exam (mental status, respiratory, cardiac, skin), discussed with treatment team, counseling and educating patient, family and staff, documenting clinical information, medication management and coordination of care.  Lamarr L. Arvid, DNP, FNP-BC Palliative Medicine Team

## 2024-06-10 DEATH — deceased

## 2024-06-11 ENCOUNTER — Ambulatory Visit

## 2024-06-20 ENCOUNTER — Ambulatory Visit: Admitting: Cardiology

## 2024-07-03 ENCOUNTER — Other Ambulatory Visit

## 2024-07-10 ENCOUNTER — Encounter: Admitting: Family Medicine

## 2024-07-26 ENCOUNTER — Ambulatory Visit: Payer: Medicare Other

## 2024-08-13 ENCOUNTER — Ambulatory Visit: Admitting: Cardiovascular Disease

## 2024-10-25 ENCOUNTER — Ambulatory Visit: Payer: Medicare Other
